# Patient Record
Sex: Male | Born: 1985 | Race: Black or African American | Hispanic: No | Marital: Single | Smoking: Former smoker
Health system: Southern US, Community
[De-identification: ages and names within clinical notes are randomized; demographics above are authoritative.]

## PROBLEM LIST (undated history)

## (undated) ENCOUNTER — Emergency Department (HOSPITAL_COMMUNITY): Admission: EM | Disposition: A | Discharge status: Home / Self Care | Payer: Medicaid Other

## (undated) DIAGNOSIS — F329 Major depressive disorder, single episode, unspecified: Secondary | ICD-10-CM

## (undated) DIAGNOSIS — I499 Cardiac arrhythmia, unspecified: Secondary | ICD-10-CM

## (undated) DIAGNOSIS — E11319 Type 2 diabetes mellitus with unspecified diabetic retinopathy without macular edema: Secondary | ICD-10-CM

## (undated) DIAGNOSIS — E109 Type 1 diabetes mellitus without complications: Secondary | ICD-10-CM

## (undated) DIAGNOSIS — Z8489 Family history of other specified conditions: Secondary | ICD-10-CM

## (undated) DIAGNOSIS — D649 Anemia, unspecified: Secondary | ICD-10-CM

## (undated) DIAGNOSIS — Z8614 Personal history of Methicillin resistant Staphylococcus aureus infection: Secondary | ICD-10-CM

## (undated) DIAGNOSIS — M1990 Unspecified osteoarthritis, unspecified site: Secondary | ICD-10-CM

## (undated) DIAGNOSIS — M86659 Other chronic osteomyelitis, unspecified thigh: Secondary | ICD-10-CM

## (undated) DIAGNOSIS — M797 Fibromyalgia: Secondary | ICD-10-CM

## (undated) DIAGNOSIS — N390 Urinary tract infection, site not specified: Secondary | ICD-10-CM

## (undated) DIAGNOSIS — T8859XA Other complications of anesthesia, initial encounter: Secondary | ICD-10-CM

## (undated) DIAGNOSIS — F419 Anxiety disorder, unspecified: Secondary | ICD-10-CM

## (undated) DIAGNOSIS — J189 Pneumonia, unspecified organism: Secondary | ICD-10-CM

## (undated) DIAGNOSIS — R569 Unspecified convulsions: Secondary | ICD-10-CM

## (undated) DIAGNOSIS — E1140 Type 2 diabetes mellitus with diabetic neuropathy, unspecified: Secondary | ICD-10-CM

## (undated) DIAGNOSIS — Z930 Tracheostomy status: Secondary | ICD-10-CM

## (undated) DIAGNOSIS — K219 Gastro-esophageal reflux disease without esophagitis: Secondary | ICD-10-CM

## (undated) DIAGNOSIS — I639 Cerebral infarction, unspecified: Secondary | ICD-10-CM

## (undated) DIAGNOSIS — Z8719 Personal history of other diseases of the digestive system: Secondary | ICD-10-CM

## (undated) DIAGNOSIS — Z86718 Personal history of other venous thrombosis and embolism: Secondary | ICD-10-CM

## (undated) DIAGNOSIS — Z889 Allergy status to unspecified drugs, medicaments and biological substances status: Secondary | ICD-10-CM

## (undated) DIAGNOSIS — F32A Depression, unspecified: Secondary | ICD-10-CM

## (undated) DIAGNOSIS — R55 Syncope and collapse: Secondary | ICD-10-CM

## (undated) DIAGNOSIS — Z933 Colostomy status: Secondary | ICD-10-CM

## (undated) DIAGNOSIS — Z8709 Personal history of other diseases of the respiratory system: Secondary | ICD-10-CM

## (undated) DIAGNOSIS — T4145XA Adverse effect of unspecified anesthetic, initial encounter: Secondary | ICD-10-CM

## (undated) DIAGNOSIS — K3184 Gastroparesis: Secondary | ICD-10-CM

## (undated) HISTORY — DX: Allergy status to unspecified drugs, medicaments and biological substances status: Z88.9

## (undated) HISTORY — DX: Syncope and collapse: R55

## (undated) HISTORY — DX: Gastro-esophageal reflux disease without esophagitis: K21.9

## (undated) HISTORY — PX: GASTROSTOMY TUBE PLACEMENT: SHX655

## (undated) HISTORY — PX: TONSILLECTOMY: SUR1361

## (undated) HISTORY — DX: Other chronic osteomyelitis, unspecified thigh: M86.659

## (undated) HISTORY — PX: COLOSTOMY: SHX63

## (undated) HISTORY — DX: Personal history of Methicillin resistant Staphylococcus aureus infection: Z86.14

## (undated) HISTORY — DX: Urinary tract infection, site not specified: N39.0

---
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
## 2003-02-10 ENCOUNTER — Emergency Department (HOSPITAL_COMMUNITY): Admission: EM | Admit: 2003-02-10 | Discharge: 2003-02-10 | Payer: Self-pay | Admitting: Emergency Medicine

## 2004-10-14 ENCOUNTER — Emergency Department (HOSPITAL_COMMUNITY): Admission: EM | Admit: 2004-10-14 | Discharge: 2004-10-14 | Payer: Self-pay | Admitting: Family Medicine

## 2004-10-18 ENCOUNTER — Ambulatory Visit: Payer: Self-pay | Admitting: Family Medicine

## 2004-10-30 ENCOUNTER — Ambulatory Visit: Payer: Self-pay | Admitting: Family Medicine

## 2005-07-09 ENCOUNTER — Ambulatory Visit: Payer: Self-pay | Admitting: Family Medicine

## 2006-03-05 ENCOUNTER — Emergency Department (HOSPITAL_COMMUNITY): Admission: EM | Admit: 2006-03-05 | Discharge: 2006-03-05 | Payer: Self-pay | Admitting: Emergency Medicine

## 2006-06-28 ENCOUNTER — Ambulatory Visit: Payer: Self-pay | Admitting: Family Medicine

## 2007-01-28 ENCOUNTER — Ambulatory Visit: Payer: Self-pay | Admitting: Family Medicine

## 2007-01-28 LAB — CONVERTED CEMR LAB: Chlamydia, Swab/Urine, PCR: NEGATIVE

## 2007-05-29 DIAGNOSIS — J45909 Unspecified asthma, uncomplicated: Secondary | ICD-10-CM

## 2007-05-29 DIAGNOSIS — E119 Type 2 diabetes mellitus without complications: Secondary | ICD-10-CM

## 2007-05-29 DIAGNOSIS — E1040 Type 1 diabetes mellitus with diabetic neuropathy, unspecified: Secondary | ICD-10-CM

## 2007-05-29 DIAGNOSIS — E1065 Type 1 diabetes mellitus with hyperglycemia: Secondary | ICD-10-CM

## 2007-09-15 ENCOUNTER — Ambulatory Visit: Payer: Self-pay | Admitting: Family Medicine

## 2007-09-15 DIAGNOSIS — K219 Gastro-esophageal reflux disease without esophagitis: Secondary | ICD-10-CM

## 2007-09-15 DIAGNOSIS — J069 Acute upper respiratory infection, unspecified: Secondary | ICD-10-CM | POA: Insufficient documentation

## 2007-11-26 ENCOUNTER — Ambulatory Visit: Payer: Self-pay | Admitting: Family Medicine

## 2008-01-26 ENCOUNTER — Telehealth: Payer: Self-pay | Admitting: Family Medicine

## 2008-05-05 ENCOUNTER — Telehealth: Payer: Self-pay | Admitting: Family Medicine

## 2008-05-13 IMAGING — CR DG HAND COMPLETE 3+V*L*
3 series · 3 of 3 positions shown · non-contrast
Comparison: none

CLINICAL DATA: Left hand laceration following a crush injury.

LEFT HAND - 3 VIEW

[view not recorded (1 of 3)]
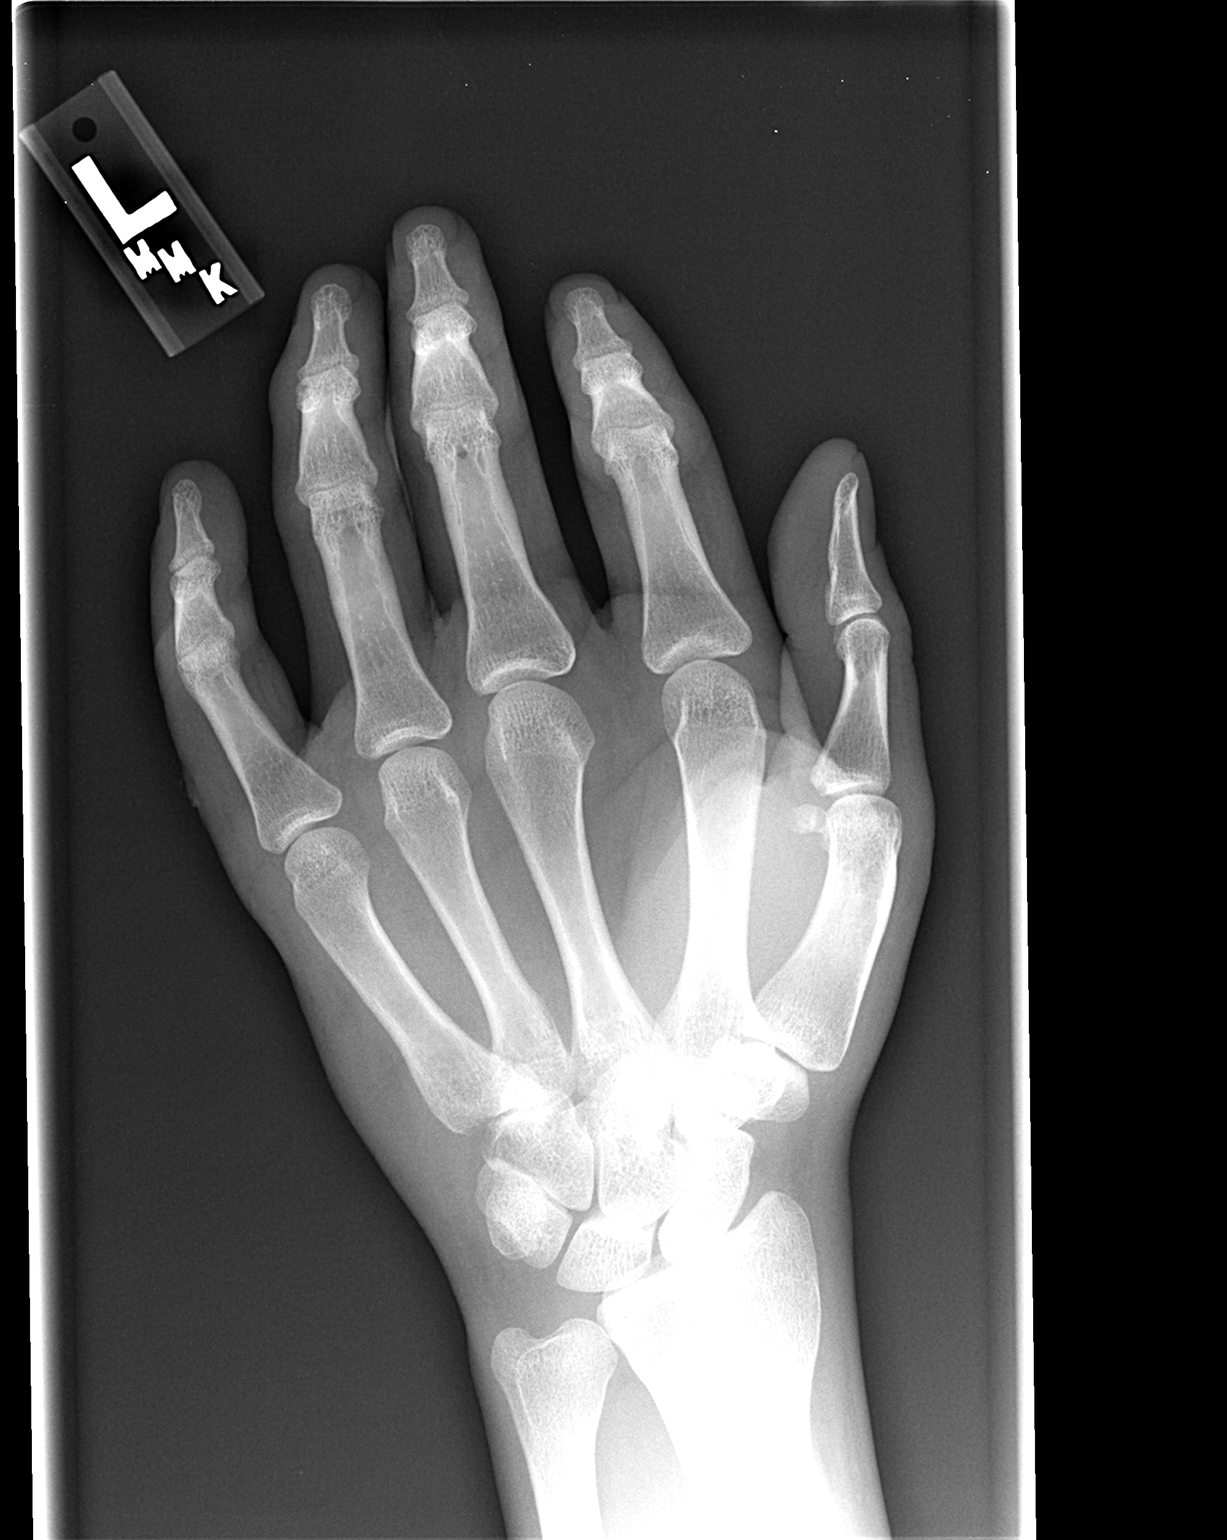

[view not recorded (2 of 3)]
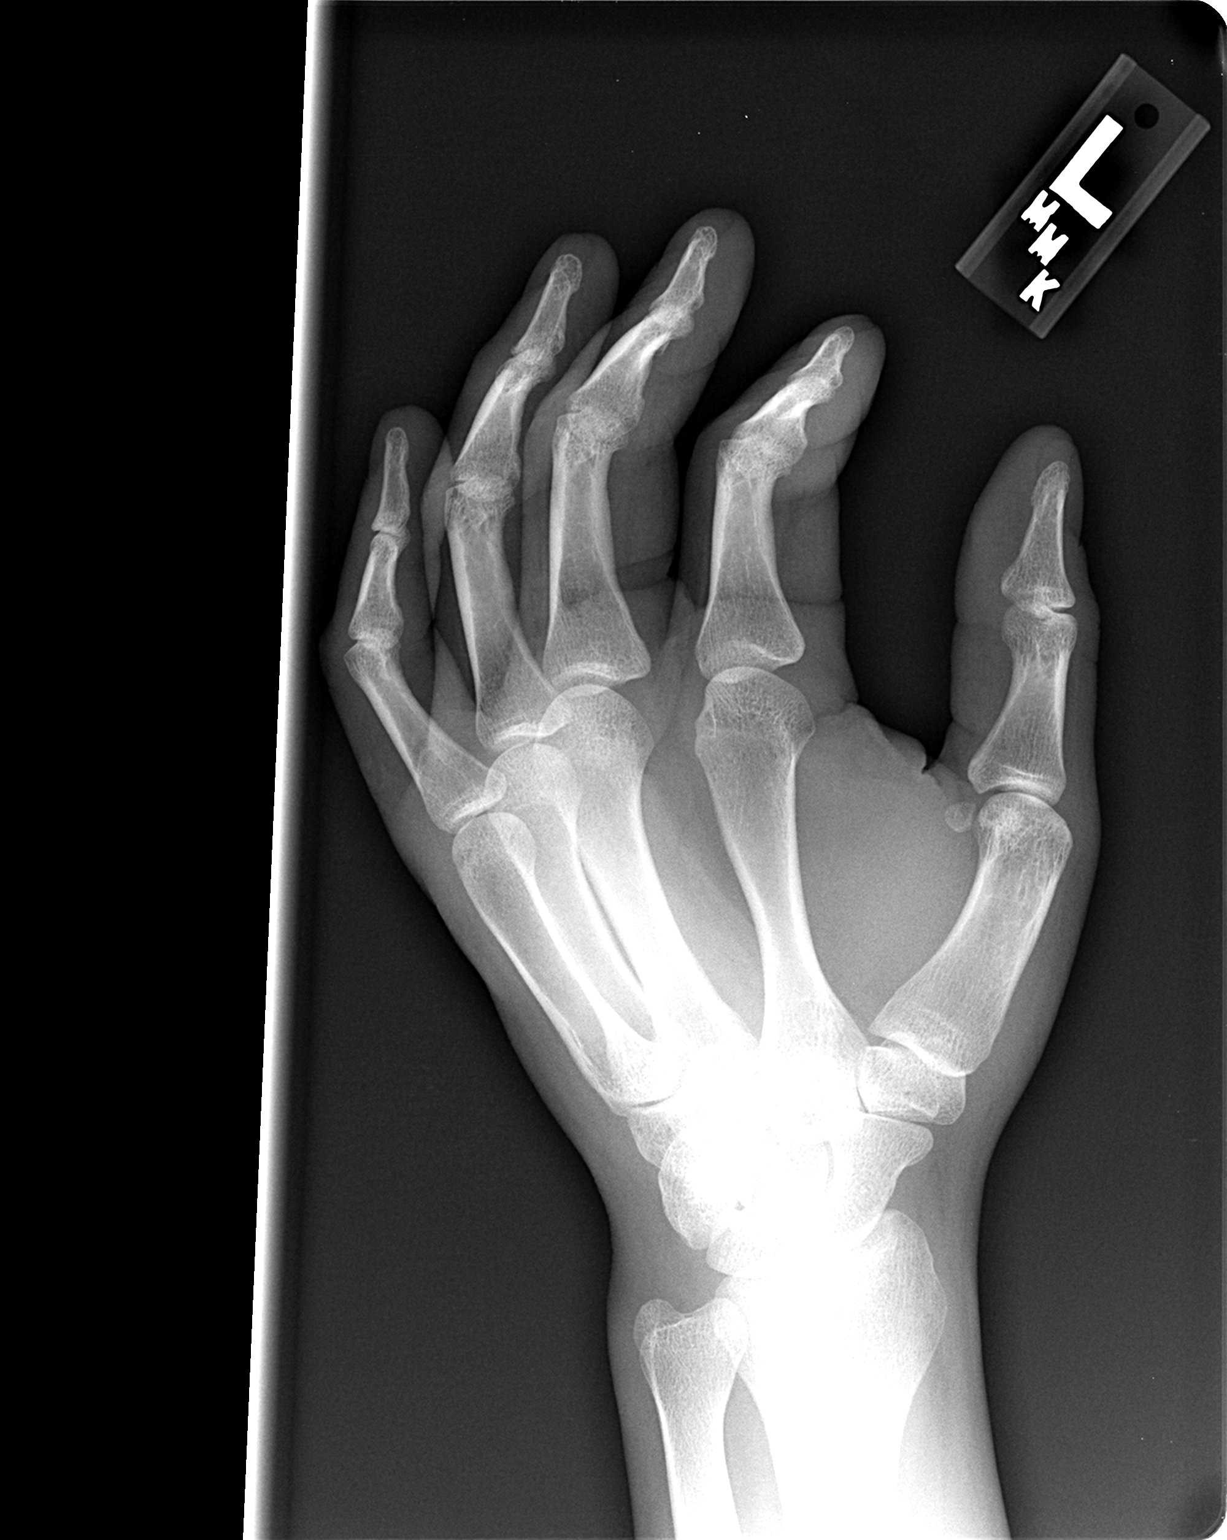

[view not recorded (3 of 3)]
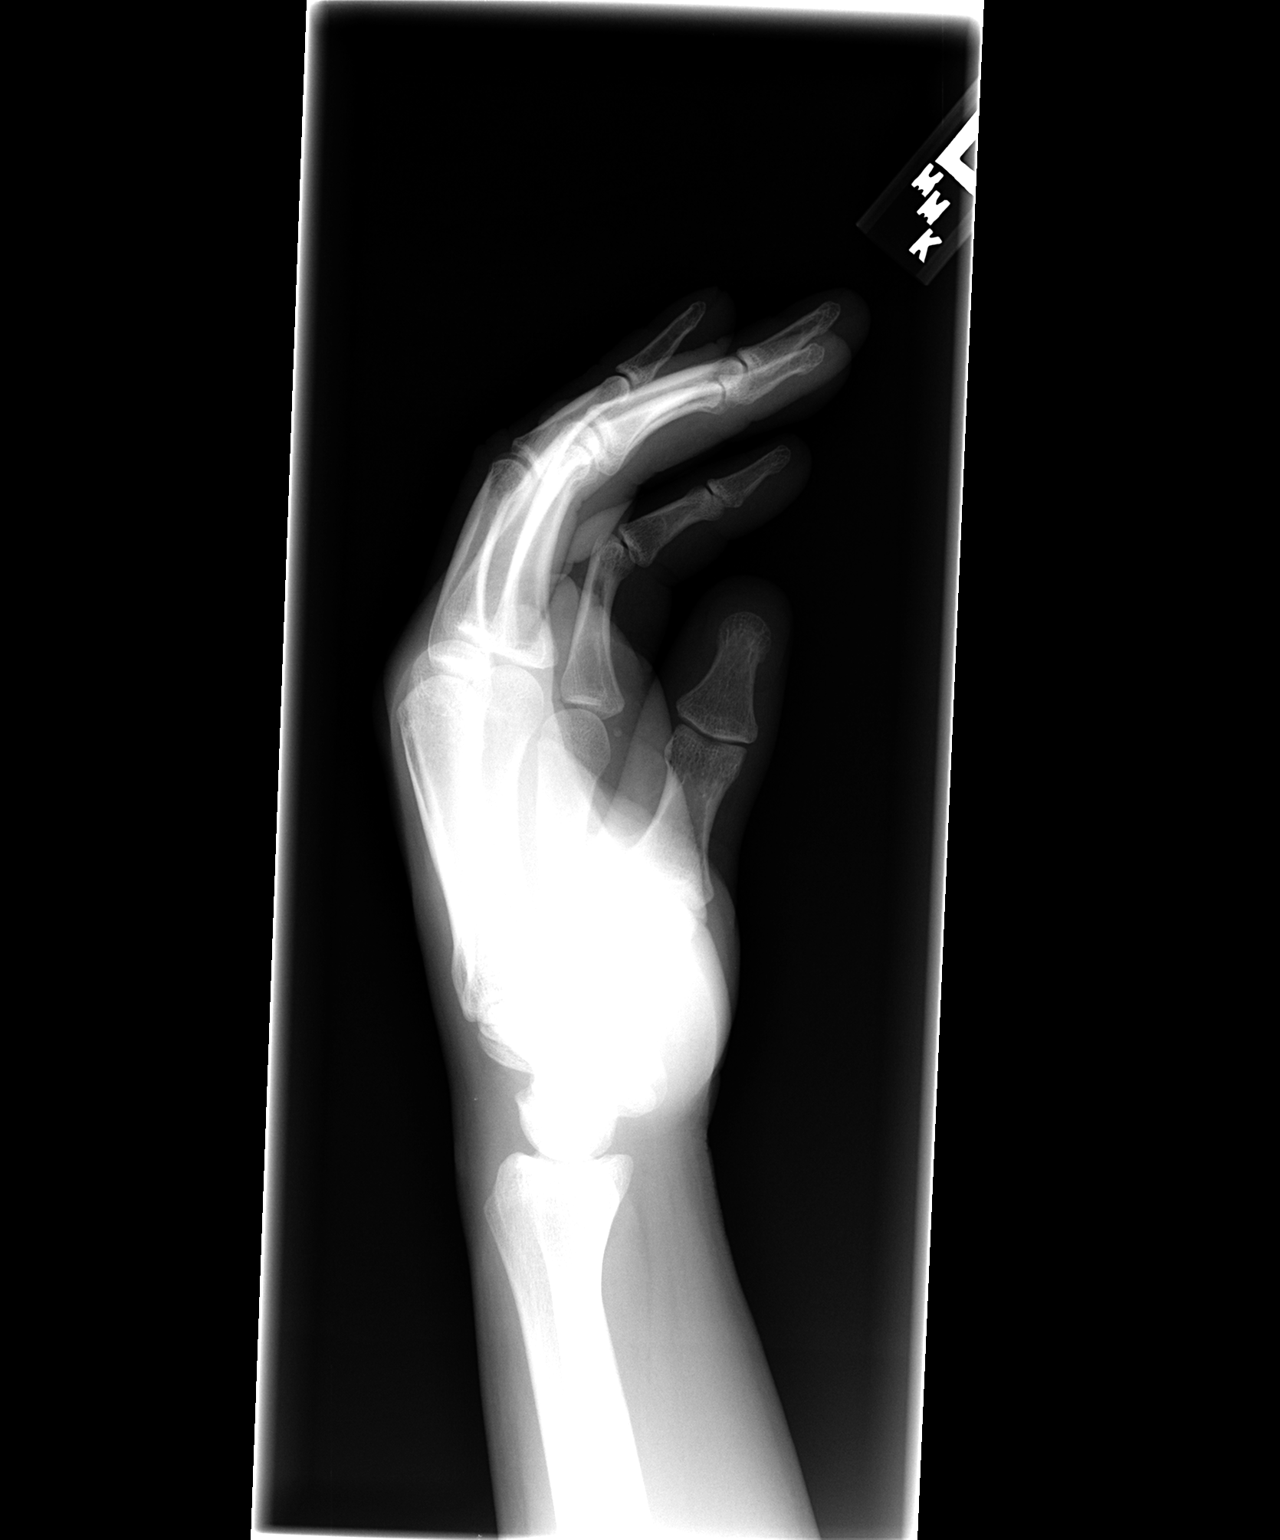

[3 of 3 positions shown; findings below may reference images not displayed]

FINDINGS: Soft tissue laceration medial to the fifth proximal phalanx.
Associated soft tissue swelling. No fracture, dislocation or soft tissue air
seen. No radiopaque foreign body seen.

IMPRESSION

No fracture or radiopaque foreign body.

## 2008-06-09 ENCOUNTER — Ambulatory Visit: Payer: Self-pay | Admitting: Family Medicine

## 2008-06-09 DIAGNOSIS — S43499A Other sprain of unspecified shoulder joint, initial encounter: Secondary | ICD-10-CM

## 2008-06-09 DIAGNOSIS — S46819A Strain of other muscles, fascia and tendons at shoulder and upper arm level, unspecified arm, initial encounter: Secondary | ICD-10-CM

## 2008-06-09 DIAGNOSIS — S20219A Contusion of unspecified front wall of thorax, initial encounter: Secondary | ICD-10-CM

## 2008-08-05 ENCOUNTER — Encounter: Payer: Self-pay | Admitting: Family Medicine

## 2009-03-02 ENCOUNTER — Telehealth: Payer: Self-pay | Admitting: Family Medicine

## 2009-05-14 ENCOUNTER — Emergency Department (HOSPITAL_BASED_OUTPATIENT_CLINIC_OR_DEPARTMENT_OTHER): Admission: EM | Admit: 2009-05-14 | Discharge: 2009-05-15 | Payer: Self-pay | Admitting: Internal Medicine

## 2009-08-27 ENCOUNTER — Ambulatory Visit: Payer: Self-pay | Admitting: Family Medicine

## 2009-08-27 DIAGNOSIS — L03211 Cellulitis of face: Secondary | ICD-10-CM | POA: Insufficient documentation

## 2009-08-27 DIAGNOSIS — L0201 Cutaneous abscess of face: Secondary | ICD-10-CM

## 2009-11-23 ENCOUNTER — Emergency Department (HOSPITAL_BASED_OUTPATIENT_CLINIC_OR_DEPARTMENT_OTHER): Admission: EM | Admit: 2009-11-23 | Discharge: 2009-11-23 | Payer: Self-pay | Admitting: Emergency Medicine

## 2009-11-23 ENCOUNTER — Ambulatory Visit: Payer: Self-pay | Admitting: Diagnostic Radiology

## 2010-01-09 ENCOUNTER — Ambulatory Visit: Payer: Self-pay | Admitting: Diagnostic Radiology

## 2010-01-09 ENCOUNTER — Emergency Department (HOSPITAL_BASED_OUTPATIENT_CLINIC_OR_DEPARTMENT_OTHER): Admission: EM | Admit: 2010-01-09 | Discharge: 2010-01-09 | Payer: Self-pay | Admitting: Emergency Medicine

## 2010-02-04 ENCOUNTER — Emergency Department (HOSPITAL_BASED_OUTPATIENT_CLINIC_OR_DEPARTMENT_OTHER): Admission: EM | Admit: 2010-02-04 | Discharge: 2010-02-04 | Payer: Self-pay | Admitting: Emergency Medicine

## 2010-02-15 ENCOUNTER — Other Ambulatory Visit: Payer: Self-pay | Admitting: Emergency Medicine

## 2010-02-15 ENCOUNTER — Inpatient Hospital Stay (HOSPITAL_COMMUNITY): Admission: EM | Admit: 2010-02-15 | Discharge: 2010-02-16 | Payer: Self-pay | Admitting: Internal Medicine

## 2010-10-20 ENCOUNTER — Telehealth: Payer: Self-pay | Admitting: Family Medicine

## 2010-11-09 NOTE — Progress Notes (Signed)
Summary: Pt needs to get letter by Dr. Clent Ridges today re: dx of Diabetes  Phone Note Call from Patient Call back at [phone] Jason [name]: father - [name] Summary of Call: Pt called and is try to get on [employer] and in order to do so, it reqs Pt to get a letter from Dr. Clent Ridges, stating that pt has been dx with diabetes and then fax the letter to [facility] Fax#[phone] attn to whom it may concern.  Initial call taken by: [name],  October 20, 2010 9:32 AM  Follow-up for Phone Call        this needs to be done by Dr. Horald Pollen, his diabetes doctor  Follow-up by: [name],  October 20, 2010 4:23 PM  Additional Follow-up for Phone Call Additional follow up Details #1::        I called pts father and notified him that he would need to contact Dr Vidal Schwalbe office as noted. Additional Follow-up by: [name],  October 23, 2010 9:28 AM

## 2010-12-26 LAB — CBC
MCHC: 34.3 g/dL (ref 30.0–36.0)
MCHC: 33.4 g/dL (ref 30.0–36.0)
MCV: 84.8 fL (ref 78.0–100.0)
Platelets: 286 10*3/uL (ref 150–400)
RBC: 3.97 MIL/uL — ABNORMAL LOW (ref 4.22–5.81)
RBC: 4.92 MIL/uL (ref 4.22–5.81)
RDW: 11.7 % (ref 11.5–15.5)

## 2010-12-26 LAB — BASIC METABOLIC PANEL
BUN: 12 mg/dL (ref 6–23)
BUN: 8 mg/dL (ref 6–23)
BUN: 8 mg/dL (ref 6–23)
BUN: 9 mg/dL (ref 6–23)
BUN: 9 mg/dL (ref 6–23)
BUN: 15 mg/dL (ref 6–23)
CO2: 24 mEq/L (ref 19–32)
CO2: 25 mEq/L (ref 19–32)
CO2: 15 mEq/L — ABNORMAL LOW (ref 19–32)
Calcium: 8.3 mg/dL — ABNORMAL LOW (ref 8.4–10.5)
Calcium: 8.7 mg/dL (ref 8.4–10.5)
Calcium: 9.6 mg/dL (ref 8.4–10.5)
Chloride: 101 mEq/L (ref 96–112)
Chloride: 103 mEq/L (ref 96–112)
Chloride: 103 mEq/L (ref 96–112)
Chloride: 109 mEq/L (ref 96–112)
Creatinine, Ser: 0.92 mg/dL (ref 0.4–1.5)
Creatinine, Ser: 0.98 mg/dL (ref 0.4–1.5)
Creatinine, Ser: 1.05 mg/dL (ref 0.4–1.5)
Creatinine, Ser: 1.2 mg/dL (ref 0.4–1.5)
GFR calc Af Amer: >60 mL/min (ref >60)
GFR calc Af Amer: >60 mL/min (ref >60)
GFR calc Af Amer: >60 mL/min (ref >60)
GFR calc Af Amer: >60 mL/min (ref >60)
GFR calc non Af Amer: >60 mL/min (ref >60)
GFR calc non Af Amer: >60 mL/min (ref >60)
GFR calc non Af Amer: >60 mL/min (ref >60)
Glucose, Bld: 163 mg/dL — ABNORMAL HIGH (ref 70–99)
Glucose, Bld: 283 mg/dL — ABNORMAL HIGH (ref 70–99)
Glucose, Bld: 346 mg/dL — ABNORMAL HIGH (ref 70–99)
Glucose, Bld: 94 mg/dL (ref 70–99)
Potassium: 3.6 mEq/L (ref 3.5–5.1)
Potassium: 3.6 mEq/L (ref 3.5–5.1)
Potassium: 3.6 mEq/L (ref 3.5–5.1)
Potassium: 4 mEq/L (ref 3.5–5.1)
Sodium: 130 mEq/L — ABNORMAL LOW (ref 135–145)
Sodium: 132 mEq/L — ABNORMAL LOW (ref 135–145)
Sodium: 137 mEq/L (ref 135–145)

## 2010-12-26 LAB — DIFFERENTIAL
Basophils Absolute: 0 10*3/uL (ref 0.0–0.1)
Basophils Relative: 0 % (ref 0–1)
Eosinophils Absolute: 0.2 10*3/uL (ref 0.0–0.7)
Monocytes Relative: 7 % (ref 3–12)
Neutro Abs: 7.1 10*3/uL (ref 1.7–7.7)
Neutrophils Relative %: 62 % (ref 43–77)

## 2010-12-26 LAB — POCT I-STAT 3, ART BLOOD GAS (G3+)
pCO2 arterial: 34.5 mmHg — ABNORMAL LOW (ref 35.0–45.0)
pH, Arterial: 7.359 (ref 7.350–7.450)
pO2, Arterial: 63 mmHg — ABNORMAL LOW (ref 80.0–100.0)

## 2010-12-26 LAB — COMPREHENSIVE METABOLIC PANEL
ALT: 12 U/L (ref 0–53)
AST: 14 U/L (ref 0–37)
CO2: 25 mEq/L (ref 19–32)
Calcium: 8.4 mg/dL (ref 8.4–10.5)
GFR calc Af Amer: >60 mL/min (ref >60)
GFR calc non Af Amer: >60 mL/min (ref >60)
Sodium: 134 mEq/L — ABNORMAL LOW (ref 135–145)
Total Protein: 5.8 g/dL — ABNORMAL LOW (ref 6.0–8.3)

## 2010-12-26 LAB — KETONES, QUALITATIVE

## 2010-12-26 LAB — POCT TOXICOLOGY PANEL

## 2010-12-26 LAB — GLUCOSE, CAPILLARY
Glucose-Capillary: 101 mg/dL — ABNORMAL HIGH (ref 70–99)
Glucose-Capillary: 112 mg/dL — ABNORMAL HIGH (ref 70–99)
Glucose-Capillary: 150 mg/dL — ABNORMAL HIGH (ref 70–99)
Glucose-Capillary: 212 mg/dL — ABNORMAL HIGH (ref 70–99)
Glucose-Capillary: 254 mg/dL — ABNORMAL HIGH (ref 70–99)
Glucose-Capillary: 365 mg/dL — ABNORMAL HIGH (ref 70–99)
Glucose-Capillary: >600 mg/dL (ref 70–99)
Glucose-Capillary: >600 mg/dL (ref 70–99)

## 2010-12-26 LAB — URINALYSIS, ROUTINE W REFLEX MICROSCOPIC
Glucose, UA: >1000 mg/dL — AB
Leukocytes, UA: NEGATIVE
Nitrite: NEGATIVE
Protein, ur: NEGATIVE mg/dL
pH: 5.5 (ref 5.0–8.0)

## 2010-12-26 LAB — PHOSPHORUS
Phosphorus: 3.4 mg/dL (ref 2.3–4.6)
Phosphorus: 3.5 mg/dL (ref 2.3–4.6)
Phosphorus: 3.6 mg/dL (ref 2.3–4.6)

## 2010-12-26 LAB — MAGNESIUM
Magnesium: 1.6 mg/dL (ref 1.5–2.5)
Magnesium: 1.6 mg/dL (ref 1.5–2.5)
Magnesium: 1.7 mg/dL (ref 1.5–2.5)

## 2010-12-26 LAB — URINE MICROSCOPIC-ADD ON

## 2010-12-26 LAB — HEPATIC FUNCTION PANEL
ALT: 15 U/L (ref 0–53)
Albumin: 3.7 g/dL (ref 3.5–5.2)
Indirect Bilirubin: 1.7 mg/dL — ABNORMAL HIGH (ref 0.3–0.9)
Total Protein: 6.5 g/dL (ref 6.0–8.3)

## 2010-12-26 LAB — LIPASE, BLOOD: Lipase: 18 U/L (ref 11–59)

## 2010-12-26 LAB — ETHANOL: Alcohol, Ethyl (B): <5 mg/dL (ref 0–10)

## 2010-12-26 LAB — AMYLASE: Amylase: 29 U/L (ref 0–105)

## 2010-12-27 LAB — POCT CARDIAC MARKERS
CKMB, poc: 1 ng/mL (ref 1.0–8.0)
CKMB, poc: <1 ng/mL — ABNORMAL LOW (ref 1.0–8.0)
Myoglobin, poc: 32.9 ng/mL (ref 12–200)
Troponin i, poc: <0.05 ng/mL (ref 0.00–0.09)

## 2010-12-27 LAB — DIFFERENTIAL
Basophils Absolute: 0.2 10*3/uL — ABNORMAL HIGH (ref 0.0–0.1)
Eosinophils Relative: 2 % (ref 0–5)
Lymphocytes Relative: 47 % — ABNORMAL HIGH (ref 12–46)
Lymphs Abs: 3.2 10*3/uL (ref 0.7–4.0)
Neutro Abs: 2.7 10*3/uL (ref 1.7–7.7)

## 2010-12-27 LAB — CBC
HCT: 35.8 % — ABNORMAL LOW (ref 39.0–52.0)
Hemoglobin: 12.8 g/dL — ABNORMAL LOW (ref 13.0–17.0)
Platelets: 282 10*3/uL (ref 150–400)
RDW: 12.1 % (ref 11.5–15.5)
WBC: 6.7 10*3/uL (ref 4.0–10.5)

## 2010-12-27 LAB — BASIC METABOLIC PANEL
Calcium: 8.8 mg/dL (ref 8.4–10.5)
GFR calc non Af Amer: >60 mL/min (ref >60)
Glucose, Bld: 380 mg/dL — ABNORMAL HIGH (ref 70–99)
Potassium: 4.1 mEq/L (ref 3.5–5.1)
Sodium: 139 mEq/L (ref 135–145)

## 2010-12-27 LAB — GLUCOSE, CAPILLARY

## 2010-12-27 LAB — PROTIME-INR: Prothrombin Time: 13 seconds (ref 11.6–15.2)

## 2011-01-14 LAB — GRAM STAIN: Gram Stain: NONE SEEN

## 2011-01-14 LAB — CULTURE, ROUTINE-ABSCESS

## 2011-02-23 NOTE — Discharge Summary (Signed)
[place]. [facility]  Patient:    Jason [name], Jason [name]                    MRN: [id number] Adm. Date:  [id number] Disc. Date: 04/15/01 Attending:  Corwin Levins                           Discharge Summary  DISCHARGE DIAGNOSES: 1. Diabetic ketoacidosis. 2. Asthma. 3. Noncompliance.  PROCEDURES:  None.  CONSULTS: 1. Diabetic counseling. 2. Nutrition counseling. 3. Social work, case Insurance account manager. 4. Pediatric psychology.  HISTORY AND PHYSICAL:  See that dictated the day of admission, April 13, 2001.  HOSPITAL COURSE:  [name] is a 25 year old black male here with diabetic ketoacidosis primary secondary to noncompliance with his insulin and "not taking it seriously."  He missed several doses.  Not associated with an infection or other physical issues.  He was treated in the usual fashion per [facility] with insulin drip and IV fluids including potassium and phosphate and frequent checking of CBGs, BMET, and ketones. Serum ketones cleared by the next morning as well as abdominal pain, nausea and vomiting, and he overall felt much better.  Acidosis resolved.  He was restarted on his usual outpatient insulin regimen, advanced on a diet and otherwise did quite well without any other clinical problems.  Hemoglobin A1C was obtained just prior to discharge.  Social work advocated a new Biochemist, clinical.  He was seen by pediatric psychology who documented issues of compliance, anxiety and risk taking behavior.  He was seen by nutrition and diabetes counseling as well which apparently had a good effect as far as education.  As he was eating well, ambulatory, acidosis resolved, CBGs in the low 100s and no other issues identified, he was felt to have gained maximum benefits of hospitalization and is to be discharged.  DISPOSITION:  Discharged to home in good condition.  DIET:  Diabetic diet as before.  ACTIVITY:  No restrictions.  HOME MEDICATIONS:  These  include all that as before including: 1. Lantus 32 units subcu q.h.s. 2. Humalog. 3. Sliding scale insulin. 4. Protonix 40 mg p.o. q.d. 5. Levsin sublingual p.r.n. 6. Albuterol metered dose inhaler. 7. Intal metered dose inhaler. 8. AeroBid metered dose inhaler.  He is also interested in obtaining a letter for school, called a letter of need, from his primary physician to receive a learners permit, drivers license.  I asked him to follow up with Dr. Lovell Sheehan about this as he would be high risk driving without more compliance with his diabetes. DD:  04/15/01 TD:  04/15/01 Job: [id number]

## 2011-02-23 NOTE — H&P (Signed)
[place]. [facility]  Patient:    Jason [name], Jason [name]                    MRN: [id number] Adm. Date:  [id number] Disc. Date: [id number] Attending:  Donnetta Hutching CC:         Jason [name], M.D. [facility]   History and Physical  CHIEF COMPLAINT:  Nausea and vomiting.  HISTORY OF PRESENT ILLNESS:  [name] is a 25 year old black male brought here by EMS after he fell ill after several episodes of nausea and vomiting earlier today.  Found in the emergency room to have marked acidosis, diabetic ketoacidosis.  Abdominal pain started last p.m.  The patient admits and the mother corroborates noncompliance with taking insulin, checking CBGs, etc.  He has been followed previously by a pediatric endocrinologist at [facility] but due to the patients noncompliance have not been able to institute the pump as would probably help him.  He is also followed by Dr. Darryll [name].  Last diabetic ketoacidosis episode requiring hospitalization was a year ago according to the mother.  He has been in the emergency room at least six times for dehydration over the past eight to ten months, however.  PAST MEDICAL HISTORY:  Illness:  1. Asthma.  2. Allergies.  3. Type I diabetes since 25 years old.  No major sequelae so far identified except questionable recent mild LFTS abnormality, unclear etiology.  4.  Noncompliance.  PAST SURGICAL HISTORY:  Status post tonsillectomy and adenoidectomy.  ALLERGIES:  CEFTIN, PENICILLIN, TESSALON PERLES.  MEDICATIONS:  Albuterol meter dose inhaler.  Intal meter dose inhaler. AeroBid meter dose inhaler.  Protonix 40 mg p.o. q.d.  Levsin sublingual. Lantus 32 units subcutaneous q.h.s. and Humalog sliding scale insulin.  SOCIAL HISTORY:  Lives with his mother and father.  He is currently a senior in high school.  He is single. No illegal drugs.  Father is a Astronomer.  FAMILY HISTORY:  Significant for diabetes, hypertension, stroke and asthma.  REVIEW OF  SYSTEMS:  No fever, otherwise noncontributory.  PHYSICAL EXAMINATION:  GENERAL:  [name] is a 25 year old black male typical DKA appearance with ketotic breath.  Somewhat tachypneic.  Tachycardia.  He is otherwise alert but mild ill appearing.  VITAL SIGNS:  Blood pressure 130/88, respirations 16, heart rate 108, afebrile.  HEENT:  Sclerae clear.  TMs clear.  Pharynx benign.  NECK:  No lymphadenopathy, JVD, thyromegaly.  CHEST:  No rales or wheezing.  CARDIAC:  Regular rate and rhythm without murmur.  ABDOMEN: Diffusely mildly tender.  Positive bowel sounds.  No organomegaly.  EXTREMITIES:  No edema.  NEUROLOGIC:  Alert and oriented times three, otherwise intact.  LABORATORY DATA:  Include initial CBG greater than 700.  Electrolytes:  Sodium 138, potassium 5.1, chloride 104, bicarbonate 11, BUN 16, creatinine 0.9, gamma 28.  Hemoglobin 14.  pH 7.14, pCO2 30, pO2 unclear.  CBC with differential and urinalysis are pending.  ASSESSMENT/PLAN: 1. Diabetic ketoacidosis.  Will initiate IV fluids and Glucometer protocol.   Follow ketones in the a.m.  Hopefully simply reinitiate his usual insulin    regimen in the morning if the nausea and vomiting resolves.  With diet    advancement.  The main issue is noncompliance.  Does not appear to have    other factors involved such as pneumonia, etc to cause DKA.  He may    benefit from diabetic education while hospitalized and possibly    adolescent behavioral health  counselling, etc. 2. Asthma.  Currently stable.  Continue medication. DD:  04/13/01 TD:  04/13/01 Job: [id number]

## 2011-11-14 ENCOUNTER — Telehealth: Payer: Self-pay | Admitting: *Deleted

## 2011-11-14 DIAGNOSIS — N469 Male infertility, unspecified: Secondary | ICD-10-CM

## 2011-11-14 NOTE — Telephone Encounter (Signed)
Tell him I have referred him to Urology for this

## 2011-11-14 NOTE — Telephone Encounter (Signed)
Pt would like to speak to Dr. Clent Ridges or his nurse how he can go about getting his infertility issues checked out.

## 2011-11-15 ENCOUNTER — Encounter: Payer: Self-pay | Admitting: Family

## 2011-11-15 ENCOUNTER — Telehealth: Payer: Self-pay | Admitting: *Deleted

## 2011-11-15 ENCOUNTER — Ambulatory Visit (INDEPENDENT_AMBULATORY_CARE_PROVIDER_SITE_OTHER): Payer: Self-pay | Admitting: Family

## 2011-11-15 VITALS — BP 120/78 | Temp 98.2°F | Ht 67.75 in | Wt 138.0 lb

## 2011-11-15 DIAGNOSIS — G609 Hereditary and idiopathic neuropathy, unspecified: Secondary | ICD-10-CM

## 2011-11-15 DIAGNOSIS — E109 Type 1 diabetes mellitus without complications: Secondary | ICD-10-CM

## 2011-11-15 DIAGNOSIS — G629 Polyneuropathy, unspecified: Secondary | ICD-10-CM

## 2011-11-15 LAB — GLUCOSE, POCT (MANUAL RESULT ENTRY)

## 2011-11-15 LAB — HEMOGLOBIN A1C: Hgb A1c MFr Bld: 14.7 % — ABNORMAL HIGH (ref 4.6–6.5)

## 2011-11-15 LAB — BASIC METABOLIC PANEL
BUN: 8 mg/dL (ref 6–23)
Creatinine, Ser: 1.1 mg/dL (ref 0.4–1.5)
GFR: 100.88 mL/min (ref >60.00)
Potassium: 3.9 mEq/L (ref 3.5–5.1)

## 2011-11-15 MED ORDER — INSULIN LISPRO PROT & LISPRO (75-25 MIX) 100 UNIT/ML ~~LOC~~ SUSP: 15.0000 [IU] | Freq: Every day | SUBCUTANEOUS | Status: DC

## 2011-11-15 MED ORDER — INSULIN LISPRO 100 UNIT/ML ~~LOC~~ SOLN: SUBCUTANEOUS | Status: DC

## 2011-11-15 NOTE — Patient Instructions (Addendum)
Diabetes and Exercise Regular exercise is important and can help:   Control blood glucose (sugar).   Decrease blood pressure.    Control blood lipids (cholesterol, triglycerides).   Improve overall health.  BENEFITS FROM EXERCISE  Improved fitness.   Improved flexibility.   Improved endurance.   Increased bone density.   Weight control.   Increased muscle strength.   Decreased body fat.   Improvement of the body's use of insulin, a hormone.   Increased insulin sensitivity.   Reduction of insulin needs.   Reduced stress and tension.   Helps you feel better.  People with diabetes who add exercise to their lifestyle gain additional benefits, including:  Weight loss.   Reduced appetite.   Improvement of the body's use of blood glucose.   Decreased risk factors for heart disease:   Lowering of cholesterol and triglycerides.   Raising the level of good cholesterol (high-density lipoproteins, HDL).   Lowering blood sugar.   Decreased blood pressure.  TYPE 1 DIABETES AND EXERCISE  Exercise will usually lower your blood glucose.   If blood glucose is greater than 240 mg/dl, check urine ketones. If ketones are present, do not exercise.   Location of the insulin injection sites may need to be adjusted with exercise. Avoid injecting insulin into areas of the body that will be exercised. For example, avoid injecting insulin into:   The arms when playing tennis.   The legs when jogging. For more information, discuss this with your caregiver.   Keep a record of:   Food intake.   Type and amount of exercise.   Expected peak times of insulin action.   Blood glucose levels.  Do this before, during, and after exercise. Review your records with your caregiver. This will help you to develop guidelines for adjusting food intake and insulin amounts.  Document Released: 12/15/2003 Document Revised: 06/06/2011 Document Reviewed: 03/31/2009 [facility] Patient  Information 2012 [place], [place].  Correction Insulin Your caregiver has decided you need insulin at home. You have been given a correctional scale (sliding scale) in case you need extra insulin when your blood sugar is too high (hyperglycemia). The following instructions will assist you in how to use that correctional scale.  WHAT IS A CORRECTIONAL SCALE (SLIDING SCALE)?  When you check your blood sugar, sometimes it will be higher than your caregiver wants it to be. You may need an extra dose of insulin to bring your blood sugar to your desired level (also known as your goal, target level, or normal level.) The correctional scale is prescribed by your caregiver based on your specific needs. Your correctional scale has 2 parts. The first shows you a blood sugar range. The second part tells you how much extra insulin to give yourself if your blood sugar falls within this range. You will not need an extra dose of insulin if your blood glucose is in the desired range. You should always give yourself the normal amount of insulin your caregiver ordered as well.  The most common time to check your blood sugar is before eating and at bedtime. Check with your caregiver so he or she can tell you what the best times are for you.  WHY IS IT IMPORTANT TO KEEP YOUR BLOOD SUGAR LEVELS AT YOUR DESIRED LEVEL?  It helps to prevent long-term complications of diabetes, such as eye disease, kidney failure, and other serious complications. WHAT TYPE OF INSULIN WILL YOU USE?  To help bring down blood sugars that are too high,  your caregiver has prescribed a short-acting or a rapid-acting insulin. An example of a short-acting insulin would be Regular. Remember, you may also have a longer-acting insulin.  WHAT DO I NEED TO DO?   Check your blood sugar with your home blood glucose meter as recommended by your caregiver.   Using your correctional scale, find the range your blood sugar lies in.   Look for the  units of insulin that matches the blood sugar range. Give yourself the dose of correctional insulin your caregiver has prescribed. Always make sure you are using the right type of insulin.   Prior to the injection make sure you have food available that you can eat in the next 15 to 30 minutes.   If your correctional insulin is rapid acting, start eating your meal within 15 minutes after you have given yourself the insulin injection. If you wait longer than 15 minutes to eat, your blood sugar might get too low.   If your correctional insulin is short acting (Regular), start eating your meal within 30 minutes after you have given yourself the insulin injection. If you wait longer than 30 minutes to eat, your blood sugar might get too low. Symptoms of low blood sugar (hypoglycemia) may include feeling shaky or weak, sweating a lot, not thinking straight, difficulty seeing, agitation, or crankiness. Check your blood sugar immediately and treat your results as directed by your caregiver.   Keep a log of your blood sugar results with the time you took the test and the amount of insulin that you injected. This information will help your caregiver manage your medications.   Note on your log anything that may affect your blood sugars such as:   Changes in normal exercise or activity.   Changes in your normal schedule, such as staying up late, going on vacation, changing your diet, or holidays.   New medications. This includes all medications. Some medications, even those that do not require a prescription, may cause high blood sugars.   Illness or stress.   Changes in when you actually took your medication.   Changes in your meals, such as skipping a meal, a late meal, or dining out.   Eating things that may affect blood glucose, such as snacks, larger meal portions than normal, or drinks with sugar.   Ask your caregiver any questions you have.  WHY DO I NEED A CORRECTIONAL SCALE (SLIDING SCALE)  IF I HAVE NEVER BEEN DIAGNOSED WITH DIABETES?   Keeping your blood glucose in range is important for your overall health.   You may have been prescribed medications that cause your blood glucose to be higher than normal.   Your discharging caregiver can provide additional information as needed.  WHEN SHOULD I SEEK MEDICAL CARE?  If you have experienced hypoglycemia that you are unable to treat with your usual routine.   You have questions about your care.   You have persistent hypoglycemia or hyperglycemia.  Someone who lives with you should seek emergency medical care if you become unresponsive. MAKE SURE YOU:  Understand these instructions.   Will watch your condition.   Will get help right away if you are not doing well or get worse.  Document Released: 02/15/2011 Document Revised: 06/12/2011 Document Reviewed: 02/15/2011 [facility] Patient Information 2012 [place], [place].

## 2011-11-15 NOTE — Telephone Encounter (Signed)
Call from [facility] lab, pt A1-C is 14.7 Glucose is [address] pt, information given to Dr Caryl Never

## 2011-11-15 NOTE — Progress Notes (Signed)
  Subjective:    Patient ID: Jason [name], male    DOB: [birth year], 25 y.o.   MRN: [id number]  HPI Comments: C/o numbness and tingling to feet. Type I diabetic diagnosed at age 39. Father and grandfather had adult onset. Does not have insurance and stopped using insulin pump one year ago which was effective for glucose control. "I used an average on pump 28units daily of novolog." Blood sugar read high on monitor. Denies polydipsia, polyuria, polyphasia, visual disturbance, or other associated s/s.      Review of Systems  Constitutional: Negative.   HENT: Negative.   Eyes: Negative for photophobia, pain, discharge, redness, itching and visual disturbance.  Respiratory: Negative.   Cardiovascular: Negative.   Gastrointestinal: Negative.   Genitourinary: Negative.   Musculoskeletal: Negative.   Skin: Negative.   Neurological: Negative.   Hematological: Negative.   Psychiatric/Behavioral: Negative.    No past medical history on file.  History   Social History  . Marital Status: Single    Spouse Name: N/A    Number of Children: N/A  . Years of Education: N/A   Occupational History  . Not on file.   Social History Main Topics  . Smoking status: Current Some Day Smoker  . Smokeless tobacco: Not on file  . Alcohol Use: No  . Drug Use: No  . Sexually Active: Not on file   Other Topics Concern  . Not on file   Social History Narrative  . No narrative on file    No past surgical history on file.  No family history on file.  Allergies  Allergen Reactions  . Cefuroxime Axetil   . Penicillins     No current outpatient prescriptions on file prior to visit.    BP 120/78  Temp(Src) 98.2 F (36.8 C) (Oral)  Ht 5' 7.75" (1.721 m)  Wt 138 lb (62.596 kg)  BMI 21.14 kg/m2chart     Objective:   Physical Exam  Constitutional: He is oriented to person, place, and time. He appears well-developed and well-nourished. No distress.  HENT:  Right Ear: External ear  normal.  Left Ear: External ear normal.  Nose: Nose normal.  Mouth/Throat: Oropharynx is clear and moist.  Eyes: Conjunctivae are normal. Pupils are equal, round, and reactive to light.  Neck: Normal range of motion. Neck supple.  Cardiovascular: Normal rate, normal heart sounds and intact distal pulses.  Exam reveals no gallop.   No murmur heard. Pulmonary/Chest: Effort normal and breath sounds normal. No respiratory distress. He has no wheezes. He has no rales. He exhibits no tenderness.  Abdominal: Soft.  Musculoskeletal: Normal range of motion.  Neurological: He is alert and oriented to person, place, and time.  Skin: Skin is warm and dry.  Psychiatric: He has a normal mood and affect. His behavior is normal.  Blood sugar read "high" on arrival, regular insulin 10 units given, reassessed  Blood sugar in with reading "high" an additional 10 units given Reassessed blood sugar after 20 units of regular insulin, 467.       Assessment & Plan:  Assessment: Type I diabetes, hyperglycemia  Plan: 75/25 humalog 15 units at dinner time. Humalog sliding scale coverage. Check blood sugar daily before meals and at bedtime, report any s/s hypoglycemia/ hyperglycemia, handout teaching provided regarding treatment regime and diabetes type I, exercise, discussed healthy plate method. Labs: A1C, Glucose RTC 3 days on Mon

## 2011-11-15 NOTE — Telephone Encounter (Signed)
Tried to reach patient with lab result and no answer.  Left message on machine to call back.

## 2011-11-15 NOTE — Telephone Encounter (Signed)
Spoke with pt

## 2011-11-16 NOTE — Telephone Encounter (Signed)
Patient refused to go to the ER. Attempt to stabilize patient with prior to discharge. His blood sugar improved with close monitoring and after 10 units regular insulin x 2. Start Humalog 75/25 15 units and sliding scale insulin. I will call this morning to encourage his to go to the ER.

## 2011-11-19 ENCOUNTER — Ambulatory Visit: Payer: Self-pay | Admitting: Family

## 2011-11-21 ENCOUNTER — Ambulatory Visit: Payer: Self-pay | Admitting: Family

## 2011-11-27 ENCOUNTER — Encounter: Payer: Self-pay | Admitting: Family

## 2011-11-27 ENCOUNTER — Ambulatory Visit (INDEPENDENT_AMBULATORY_CARE_PROVIDER_SITE_OTHER): Payer: Self-pay | Admitting: Family

## 2011-11-27 VITALS — BP 120/88 | Temp 97.5°F | Wt 148.0 lb

## 2011-11-27 DIAGNOSIS — E109 Type 1 diabetes mellitus without complications: Secondary | ICD-10-CM

## 2011-11-27 NOTE — Patient Instructions (Signed)
1. Increase 75/25 to 20 units. Continue to check blood sugars as scheduled.  Diabetes Meal Planning Guide The diabetes meal planning guide is a tool to help you plan your meals and snacks. It is important for people with diabetes to manage their blood glucose (sugar) levels. Choosing the right foods and the right amounts throughout your day will help control your blood glucose. Eating right can even help you improve your blood pressure and reach or maintain a healthy weight. CARBOHYDRATE COUNTING MADE EASY When you eat carbohydrates, they turn to sugar. This raises your blood glucose level. Counting carbohydrates can help you control this level so you feel better. When you plan your meals by counting carbohydrates, you can have more flexibility in what you eat and balance your medicine with your food intake. Carbohydrate counting simply means adding up the total amount of carbohydrate grams in your meals and snacks. Try to eat about the same amount at each meal. Foods with carbohydrates are listed below. Each portion below is 1 carbohydrate serving or 15 grams of carbohydrates. Ask your dietician how many grams of carbohydrates you should eat at each meal or snack. Grains and Starches  1 slice bread.    English muffin or hotdog/hamburger bun.    cup cold cereal (unsweetened).   ? cup cooked pasta or rice.    cup starchy vegetables (corn, potatoes, peas, beans, winter squash).   1 tortilla (6 inches).    bagel.   1 waffle or pancake (size of a CD).    cup cooked cereal.   4 to 6 small crackers.  *Whole grain is recommended. Fruit  1 cup fresh unsweetened berries, melon, papaya, pineapple.   1 small fresh fruit.    banana or mango.    cup fruit juice (4 oz unsweetened).    cup canned fruit in natural juice or water.   2 tbs dried fruit.   12 to 15 grapes or cherries.  Milk and Yogurt  1 cup fat-free or 1% milk.   1 cup soy milk.   6 oz light yogurt with  sugar-free sweetener.   6 oz low-fat soy yogurt.   6 oz plain yogurt.  Vegetables  1 cup raw or  cup cooked is counted as 0 carbohydrates or a "free" food.   If you eat 3 or more servings at 1 meal, count them as 1 carbohydrate serving.  Other Carbohydrates   oz chips or pretzels.    cup ice cream or frozen yogurt.    cup sherbet or sorbet.   2 inch square cake, no frosting.   1 tbs honey, sugar, jam, jelly, or syrup.   2 small cookies.   3 squares of graham crackers.   3 cups popcorn.   6 crackers.   1 cup broth-based soup.   Count 1 cup casserole or other mixed foods as 2 carbohydrate servings.   Foods with less than 20 calories in a serving may be counted as 0 carbohydrates or a "free" food.  You may want to purchase a book or computer software that lists the carbohydrate gram counts of different foods. In addition, the nutrition facts panel on the labels of the foods you eat are a good source of this information. The label will tell you how big the serving size is and the total number of carbohydrate grams you will be eating per serving. Divide this number by 15 to obtain the number of carbohydrate servings in a portion. Remember, 1 carbohydrate serving  equals 15 grams of carbohydrate. SERVING SIZES Measuring foods and serving sizes helps you make sure you are getting the right amount of food. The list below tells how big or small some common serving sizes are.  1 oz.........4 stacked dice.   3 oz........Marland KitchenDeck of cards.   1 tsp.......Marland KitchenTip of little finger.   1 tbs......Marland KitchenMarland KitchenThumb.   2 tbs.......Marland KitchenGolf ball.    cup......Marland KitchenHalf of a fist.   1 cup.......Marland KitchenA fist.  SAMPLE DIABETES MEAL PLAN Below is a sample meal plan that includes foods from the grain and starches, dairy, vegetable, fruit, and meat groups. A dietician can individualize a meal plan to fit your calorie needs and tell you the number of servings needed from each food group. However, controlling the  total amount of carbohydrates in your meal or snack is more important than making sure you include all of the food groups at every meal. You may interchange carbohydrate containing foods (dairy, starches, and fruits). The meal plan below is an example of a 2000 calorie diet using carbohydrate counting. This meal plan has 17 carbohydrate servings. Breakfast  1 cup oatmeal (2 carb servings).    cup light yogurt (1 carb serving).   1 cup blueberries (1 carb serving).    cup almonds.  Snack  1 large apple (2 carb servings).   1 low-fat string cheese stick.  Lunch  Chicken breast salad.   1 cup spinach.    cup chopped tomatoes.   2 oz chicken breast, sliced.   2 tbs low-fat [place] dressing.   12 whole-wheat crackers (2 carb servings).   12 to 15 grapes (1 carb serving).   1 cup low-fat milk (1 carb serving).  Snack  1 cup carrots.    cup hummus (1 carb serving).  Dinner  3 oz broiled salmon.   1 cup brown rice (3 carb servings).  Snack  1  cups steamed broccoli (1 carb serving) drizzled with 1 tsp olive oil and lemon juice.   1 cup light pudding (2 carb servings).  DIABETES MEAL PLANNING WORKSHEET Your dietician can use this worksheet to help you decide how many servings of foods and what types of foods are right for you.  BREAKFAST Food Group and Servings / Carb Servings Grain/Starches __________________________________ Dairy __________________________________________ Vegetable ______________________________________ Fruit ___________________________________________ Meat __________________________________________ Fat ____________________________________________ LUNCH Food Group and Servings / Carb Servings Grain/Starches ___________________________________ Dairy ___________________________________________ Fruit ____________________________________________ Meat ___________________________________________ Fat  _____________________________________________ Jason [name] Food Group and Servings / Carb Servings Grain/Starches ___________________________________ Dairy ___________________________________________ Fruit ____________________________________________ Meat ___________________________________________ Fat _____________________________________________ SNACKS Food Group and Servings / Carb Servings Grain/Starches ___________________________________ Dairy ___________________________________________ Vegetable _______________________________________ Fruit ____________________________________________ Meat ___________________________________________ Fat _____________________________________________ DAILY TOTALS Starches _________________________ Vegetable ________________________ Fruit ____________________________ Dairy ____________________________ Meat ____________________________ Fat ______________________________ Document Released: 06/21/2005 Document Revised: 06/06/2011 Document Reviewed: 05/02/2009 ExitCare Patient Information 2012 [place], [place].

## 2011-11-27 NOTE — Progress Notes (Signed)
Subjective:    Patient ID: Jason [name], male    DOB: 07/22/1986, 26 y.o.   MRN: [id number]  HPI 26 year old African American male, nonsmoker, patient of Dr. Clent Ridges is in for recheck of type 1 diabetes. His last office visit his blood sugars were reading high. BMP revealed a blood sugar in the 700s. The patient was stabilized before leaving that last office visit blood sugar at that time was in the upper 300s. At that time he was started on Humulin 75/2515 units daily and a sliding scale. His blood sugars have improved tremendously overall he's had no blood sugars greater than 300. Fasting blood sugars are in the 200 and around lunch he is in the upper 100. He denies any increase in urination or thirst, no lightheadedness, dizziness, chest pain, palpitations, shortness of breath or edema   Review of Systems  Constitutional: Negative.   HENT: Negative.   Respiratory: Negative.   Cardiovascular: Negative.   Gastrointestinal: Negative.   Musculoskeletal: Negative.   Skin: Negative.   Hematological: Negative.   Psychiatric/Behavioral: Negative.    Past Medical History  Diagnosis Date  . Diabetes mellitus   . GERD (gastroesophageal reflux disease)   . Asthma   . Hx MRSA infection     on face    History   Social History  . Marital Status: Single    Spouse Name: N/A    Number of Children: N/A  . Years of Education: N/A   Occupational History  . Not on file.   Social History Main Topics  . Smoking status: Current Some Day Smoker  . Smokeless tobacco: Not on file  . Alcohol Use: No  . Drug Use: No  . Sexually Active: Not on file   Other Topics Concern  . Not on file   Social History Narrative  . No narrative on file    Past Surgical History  Procedure Date  . Tonsillectomy     Family History  Problem Relation Age of Onset  . Diabetes Father   . Asthma      fhx  . Hypertension      fhx  . Stroke      fhx    Allergies  Allergen Reactions  . Cefuroxime  Axetil   . Penicillins     Current Outpatient Prescriptions on File Prior to Visit  Medication Sig Dispense Refill  . insulin lispro (HUMALOG) 100 UNIT/ML injection Sliding scale  10 mL  12  . insulin lispro protamine-insulin lispro (HUMALOG MIX 75/25) (75-25) 100 UNIT/ML SUSP Inject 15 Units into the skin daily with supper.  10 mL  12    BP 120/88  Temp(Src) 97.5 F (36.4 C) (Oral)  Wt 148 lb (67.132 kg)chart    Objective:   Physical Exam  Constitutional: He is oriented to person, place, and time. He appears well-developed and well-nourished.  HENT:  Right Ear: External ear normal.  Left Ear: External ear normal.  Nose: Nose normal.  Mouth/Throat: Oropharynx is clear and moist.  Cardiovascular: Normal rate, regular rhythm and normal heart sounds.   Pulmonary/Chest: Effort normal and breath sounds normal.  Abdominal: Soft. Bowel sounds are normal.  Musculoskeletal: Normal range of motion.  Neurological: He is alert and oriented to person, place, and time.  Skin: Skin is warm and dry.  Psychiatric: He has a normal mood and affect.          Assessment & Plan:  Assessment: Type 1 diabetes-uncontrolled  Plan: Increase Humalog 75/25-20 units daily.  Continue sliding scale insulin. Encouraged healthy diet and exercise. We'll bring patient back for recheck in 3 weeks and sooner when necessary.

## 2011-12-18 ENCOUNTER — Ambulatory Visit (INDEPENDENT_AMBULATORY_CARE_PROVIDER_SITE_OTHER): Payer: Self-pay | Admitting: Family

## 2011-12-18 ENCOUNTER — Encounter: Payer: Self-pay | Admitting: Family

## 2011-12-18 DIAGNOSIS — E1165 Type 2 diabetes mellitus with hyperglycemia: Secondary | ICD-10-CM

## 2011-12-18 DIAGNOSIS — R51 Headache: Secondary | ICD-10-CM

## 2011-12-18 LAB — POCT URINALYSIS DIPSTICK
Leukocytes, UA: NEGATIVE
Nitrite, UA: NEGATIVE
Urobilinogen, UA: 0.2

## 2011-12-18 LAB — BASIC METABOLIC PANEL
Calcium: 9.7 mg/dL (ref 8.4–10.5)
GFR: 101.86 mL/min (ref >60.00)
Sodium: 138 mEq/L (ref 135–145)

## 2011-12-18 NOTE — Progress Notes (Signed)
  Subjective:    Patient ID: Jason [name], male    DOB: 1986/02/04, 26 y.o.   MRN: [id number]  HPI Comments: C/o new onset lightheadedness when sitting to standing x 1.5 weeks. Denies etoh or drug use. Stating took blood sugar at time of incidence and was 125 and another time 200. Has history noncompliance with type I diabetes and taking insulin. Has intermittent headaches, denies pain at present. Denies loc, cp, tremors, seizures, speech difficulty, or weakness.      Review of Systems  Constitutional: Negative.   Respiratory: Negative.   Cardiovascular: Negative.   Neurological: Positive for light-headedness and headaches. Negative for dizziness, tremors, seizures, syncope, facial asymmetry, speech difficulty and numbness.   Past Medical History  Diagnosis Date  . Diabetes mellitus   . GERD (gastroesophageal reflux disease)   . Asthma   . Hx MRSA infection     on face    History   Social History  . Marital Status: Single    Spouse Name: N/A    Number of Children: N/A  . Years of Education: N/A   Occupational History  . Not on file.   Social History Main Topics  . Smoking status: Current Some Day Smoker  . Smokeless tobacco: Not on file  . Alcohol Use: No  . Drug Use: No  . Sexually Active: Not on file   Other Topics Concern  . Not on file   Social History Narrative  . No narrative on file    Past Surgical History  Procedure Date  . Tonsillectomy     Family History  Problem Relation Age of Onset  . Diabetes Father   . Asthma      fhx  . Hypertension      fhx  . Stroke      fhx    Allergies  Allergen Reactions  . Cefuroxime Axetil   . Penicillins     Current Outpatient Prescriptions on File Prior to Visit  Medication Sig Dispense Refill  . insulin lispro (HUMALOG) 100 UNIT/ML injection Sliding scale  10 mL  12  . insulin lispro protamine-insulin lispro (HUMALOG MIX 75/25) (75-25) 100 UNIT/ML SUSP Inject 15 Units into the skin daily with  supper.  10 mL  12    BP 116/72  Temp(Src) 98.4 F (36.9 C) (Oral)  Wt 138 lb (62.596 kg)chart    Objective:   Physical Exam  Constitutional: He is oriented to person, place, and time. He appears well-developed and well-nourished. No distress.  Cardiovascular: Normal rate, regular rhythm, normal heart sounds and intact distal pulses.  Exam reveals no gallop and no friction rub.   No murmur heard. Pulmonary/Chest: Effort normal and breath sounds normal. No respiratory distress. He has no wheezes. He has no rales. He exhibits no tenderness.  Neurological: He is alert and oriented to person, place, and time.  Skin: Skin is warm and dry. He is not diaphoretic.  Psychiatric: He has a normal mood and affect. His behavior is normal.          Assessment & Plan:  Assessment: Type 2 diabetes-uncontrolled, probable-Dehydration, headache  Plan: Labs: BMP, urinalysis Teaching handouts provided on diagnosis. Encouraged to RTC if s/s get worse.

## 2012-01-13 ENCOUNTER — Encounter (HOSPITAL_BASED_OUTPATIENT_CLINIC_OR_DEPARTMENT_OTHER): Payer: Self-pay | Admitting: *Deleted

## 2012-01-13 ENCOUNTER — Emergency Department (HOSPITAL_BASED_OUTPATIENT_CLINIC_OR_DEPARTMENT_OTHER)
Admission: EM | Admit: 2012-01-13 | Discharge: 2012-01-14 | Disposition: A | Discharge status: Home / Self Care | Payer: Self-pay | Attending: Emergency Medicine | Admitting: Emergency Medicine

## 2012-01-13 DIAGNOSIS — Z8614 Personal history of Methicillin resistant Staphylococcus aureus infection: Secondary | ICD-10-CM | POA: Insufficient documentation

## 2012-01-13 DIAGNOSIS — Z794 Long term (current) use of insulin: Secondary | ICD-10-CM | POA: Insufficient documentation

## 2012-01-13 DIAGNOSIS — J45909 Unspecified asthma, uncomplicated: Secondary | ICD-10-CM | POA: Insufficient documentation

## 2012-01-13 DIAGNOSIS — F17200 Nicotine dependence, unspecified, uncomplicated: Secondary | ICD-10-CM | POA: Insufficient documentation

## 2012-01-13 DIAGNOSIS — K0889 Other specified disorders of teeth and supporting structures: Secondary | ICD-10-CM

## 2012-01-13 DIAGNOSIS — S025XXA Fracture of tooth (traumatic), initial encounter for closed fracture: Secondary | ICD-10-CM | POA: Insufficient documentation

## 2012-01-13 DIAGNOSIS — K219 Gastro-esophageal reflux disease without esophagitis: Secondary | ICD-10-CM | POA: Insufficient documentation

## 2012-01-13 DIAGNOSIS — X58XXXA Exposure to other specified factors, initial encounter: Secondary | ICD-10-CM | POA: Insufficient documentation

## 2012-01-13 NOTE — ED Notes (Signed)
Pt states that he thinks he has a broken tooth states that tooth is dangling pt pain right upper jaw

## 2012-01-14 MED ORDER — HYDROCODONE-ACETAMINOPHEN 5-500 MG PO TABS: 1.0000 | ORAL_TABLET | Freq: Four times a day (QID) | ORAL | Status: AC | PRN

## 2012-01-14 MED ORDER — ERYTHROMYCIN BASE 333 MG PO TBEC
333.0000 mg | DELAYED_RELEASE_TABLET | Freq: Four times a day (QID) | ORAL | Status: DC
  Filled 2012-01-14: qty 1

## 2012-01-14 MED ORDER — HYDROCODONE-ACETAMINOPHEN 5-325 MG PO TABS
2.0000 | ORAL_TABLET | Freq: Once | ORAL | Status: AC
  Administered 2012-01-14: 2 via ORAL
  Filled 2012-01-14: qty 2

## 2012-01-14 MED ORDER — ERYTHROMYCIN BASE 250 MG PO TABS: 333.0000 mg | ORAL_TABLET | Freq: Four times a day (QID) | ORAL | Status: AC

## 2012-01-14 NOTE — ED Notes (Signed)
Pt presents to ED today with c/o teeth pain.  For the last several days.  Pt does not have a dentist

## 2012-01-14 NOTE — Discharge Instructions (Signed)
Dental Pain A tooth ache may be caused by cavities (tooth decay). Cavities expose the nerve of the tooth to air and hot or cold temperatures. It may come from an infection or abscess (also called a boil or furuncle) around your tooth. It is also often caused by dental caries (tooth decay). This causes the pain you are having. DIAGNOSIS  Your caregiver can diagnose this problem by exam. TREATMENT   If caused by an infection, it may be treated with medications which kill germs (antibiotics) and pain medications as prescribed by your caregiver. Take medications as directed.   Only take over-the-counter or prescription medicines for pain, discomfort, or fever as directed by your caregiver.   Whether the tooth ache today is caused by infection or dental disease, you should see your dentist as soon as possible for further care.  SEEK MEDICAL CARE IF: The exam and treatment you received today has been provided on an emergency basis only. This is not a substitute for complete medical or dental care. If your problem worsens or new problems (symptoms) appear, and you are unable to meet with your dentist, call or return to this location. SEEK IMMEDIATE MEDICAL CARE IF:   You have a fever.   You develop redness and swelling of your face, jaw, or neck.   You are unable to open your mouth.   You have severe pain uncontrolled by pain medicine.  MAKE SURE YOU:   Understand these instructions.   Will watch your condition.   Will get help right away if you are not doing well or get worse.  Document Released: 09/24/2005 Document Revised: 09/13/2011 Document Reviewed: 05/12/2008 ExitCare Patient Information 2012 [employer].  RESOURCE GUIDE  Dental Problems  Patients with Medicaid: [facility]                     [facility] [address]                                           [address] Phone:  [phone]                                                   Phone:  [phone]  If unable to pay or uninsured, contact:  [facility] or [facility] to become qualified for the adult dental clinic.  Chronic Pain Problems Contact [facility]  [phone] Patients need to be referred by their primary care doctor.  Insufficient Money for Medicine Contact [employer]:  call "211" or [facility] 271-5999.  No Primary Care Doctor Call [facility]  832-8000 Other agencies that provide inexpensive medical care    [facility]  [phone]    [facility]  [phone]    [facility]  [phone]    [facility] Clinic  [phone]    [facility]  [phone]    [facility]  [phone]  Psychological Services  Health  [phone] [facility]  [phone] [facility]   800 853-5163 (emergency services 641-4993)  Substance Abuse Resources [facility]  336-882-2125 [facility] 336-784-9470 [facility] 336-285-9073 [facility]   336-845-3988 Residential & Outpatient Substance Abuse Program  800-659-3381  Abuse/Neglect [place] Child Abuse Hotline (336) 641-3795 [place] [facility] Abuse Hotline 800-378-5315 (After Hours)  Emergency Shelter [facility] (336) 271-5985  [facility] (336) 275-9566 [facility] (704) 372-4663  MRSA Hotline #:   832-7006    [place] Resources  [facility] [place]     [employer]                          [facility] [address] [place]                       [address]      [address]  [place]                                                [place]                            [place] Phone:  [phone]                                   Phone:  [phone]                 Phone:  [phone]  [facility] Phone:   [phone]  [place] Child Abuse Hotline (336) 342-1394 (336) 342-3537 (After Hours)   

## 2012-01-14 NOTE — ED Provider Notes (Signed)
History   This chart was scribed for Geoffery Lyons, MD by Charolett Bumpers . The patient was seen in room MHH2/MHH2 and the patient's care was started at 12:27pm.    CSN: [id number]  Arrival date & time 01/13/12  2313   None     No chief complaint on file.   (Consider location/radiation/quality/duration/timing/severity/associated sxs/prior treatment) HPI Jason [name] is a 26 y.o. male who presents to the Emergency Department complaining of constant, moderate right upper tooth pain since last night. Patient states that he was eating a hard candy when inside of this tooth broke a couple months ago. Patient states that last night, he was eating when the outside of the tooth broke and reports associated sharp pain. Patient states that he clinched his teeth last night when more of the tooth broke and pain radiated into his jaw. Patient states that he does not have a dentist.   Past Medical History  Diagnosis Date  . Diabetes mellitus   . GERD (gastroesophageal reflux disease)   . Asthma   . Hx MRSA infection     on face    Past Surgical History  Procedure Date  . Tonsillectomy     Family History  Problem Relation Age of Onset  . Diabetes Father   . Asthma      fhx  . Hypertension      fhx  . Stroke      fhx    History  Substance Use Topics  . Smoking status: Current Some Day Smoker  . Smokeless tobacco: Not on file  . Alcohol Use: No      Review of Systems A complete 10 system review of systems was obtained and all systems are negative except as noted in the HPI and PMH.   Allergies  Cefuroxime axetil and Penicillins  Home Medications   Current Outpatient Rx  Name Route Sig Dispense Refill  . DIPHENHYDRAMINE HCL 25 MG PO TABS Oral Take 25 mg by mouth every 6 (six) hours as needed. For allergies    . VICODIN PO Oral Take 1 tablet by mouth every 4 (four) hours as needed. For pain    . IBUPROFEN 800 MG PO TABS Oral Take 800 mg by mouth every 8 (eight)  hours as needed. For pain    . INSULIN LISPRO (HUMAN) 100 UNIT/ML [place] SOLN Subcutaneous Inject 2-15 Units into the skin 3 (three) times daily. Sliding scale with every meal    . INSULIN LISPRO PROT & LISPRO (75-25) 100 UNIT/ML [place] SUSP Subcutaneous Inject 20 Units into the skin daily with supper.    Marland Kitchen METOCLOPRAMIDE HCL 10 MG PO TABS Oral Take 10 mg by mouth 4 (four) times daily as needed. To block bile duct    . OMEPRAZOLE-SODIUM BICARBONATE 20-1100 MG PO CAPS Oral Take 1 capsule by mouth daily.    . ERYTHROMYCIN BASE 250 MG PO TABS Oral Take 1.5 tablets (375 mg total) by mouth every 6 (six) hours. 28 tablet 0    BP 118/82  Pulse 97  Temp(Src) 98.2 F (36.8 C) (Oral)  Resp 16  SpO2 100%  Physical Exam  Nursing note and vitals reviewed. Constitutional: He is oriented to person, place, and time. He appears well-developed and well-nourished. No distress.  HENT:  Head: Normocephalic and atraumatic.  Right Ear: External ear normal.  Left Ear: External ear normal.  Nose: Nose normal.       Right upper bicuspid is cracked. Tooth is loose in socket. Tenderness  noted.   Eyes: Conjunctivae and EOM are normal. Pupils are equal, round, and reactive to light.  Neck: Normal range of motion. Neck supple. No tracheal deviation present.  Cardiovascular: Normal rate.   Pulmonary/Chest: Effort normal. No respiratory distress.  Abdominal: Soft. He exhibits no distension.  Musculoskeletal: Normal range of motion. He exhibits no edema.  Neurological: He is alert and oriented to person, place, and time. No sensory deficit.  Skin: Skin is warm and dry.  Psychiatric: He has a normal mood and affect. His behavior is normal.    ED Course  Procedures (including critical care time)  DIAGNOSTIC STUDIES: Oxygen Saturation is 100% on room air, normal by my interpretation.    COORDINATION OF CARE:  0030: Discussed planned course of treatment with the patient. Discussed f/u with dentist.    Labs Reviewed  - No data to display No results found.   1. Pain, dental       MDM  Has broken tooth with loose fragment.  Needs to see dentist.  Will treat with pain meds, antibiotics for now.     I personally performed the services described in this documentation, which was scribed in my presence. The recorded information has been reviewed and considered.         Geoffery Lyons, MD 01/14/12 [phone]

## 2012-02-01 IMAGING — CR DG CHEST 2V
2 series · 2 of 2 positions shown · non-contrast
Comparison: None.

CLINICAL DATA: Chest pain

CHEST - 2 VIEW

[w chest pa]
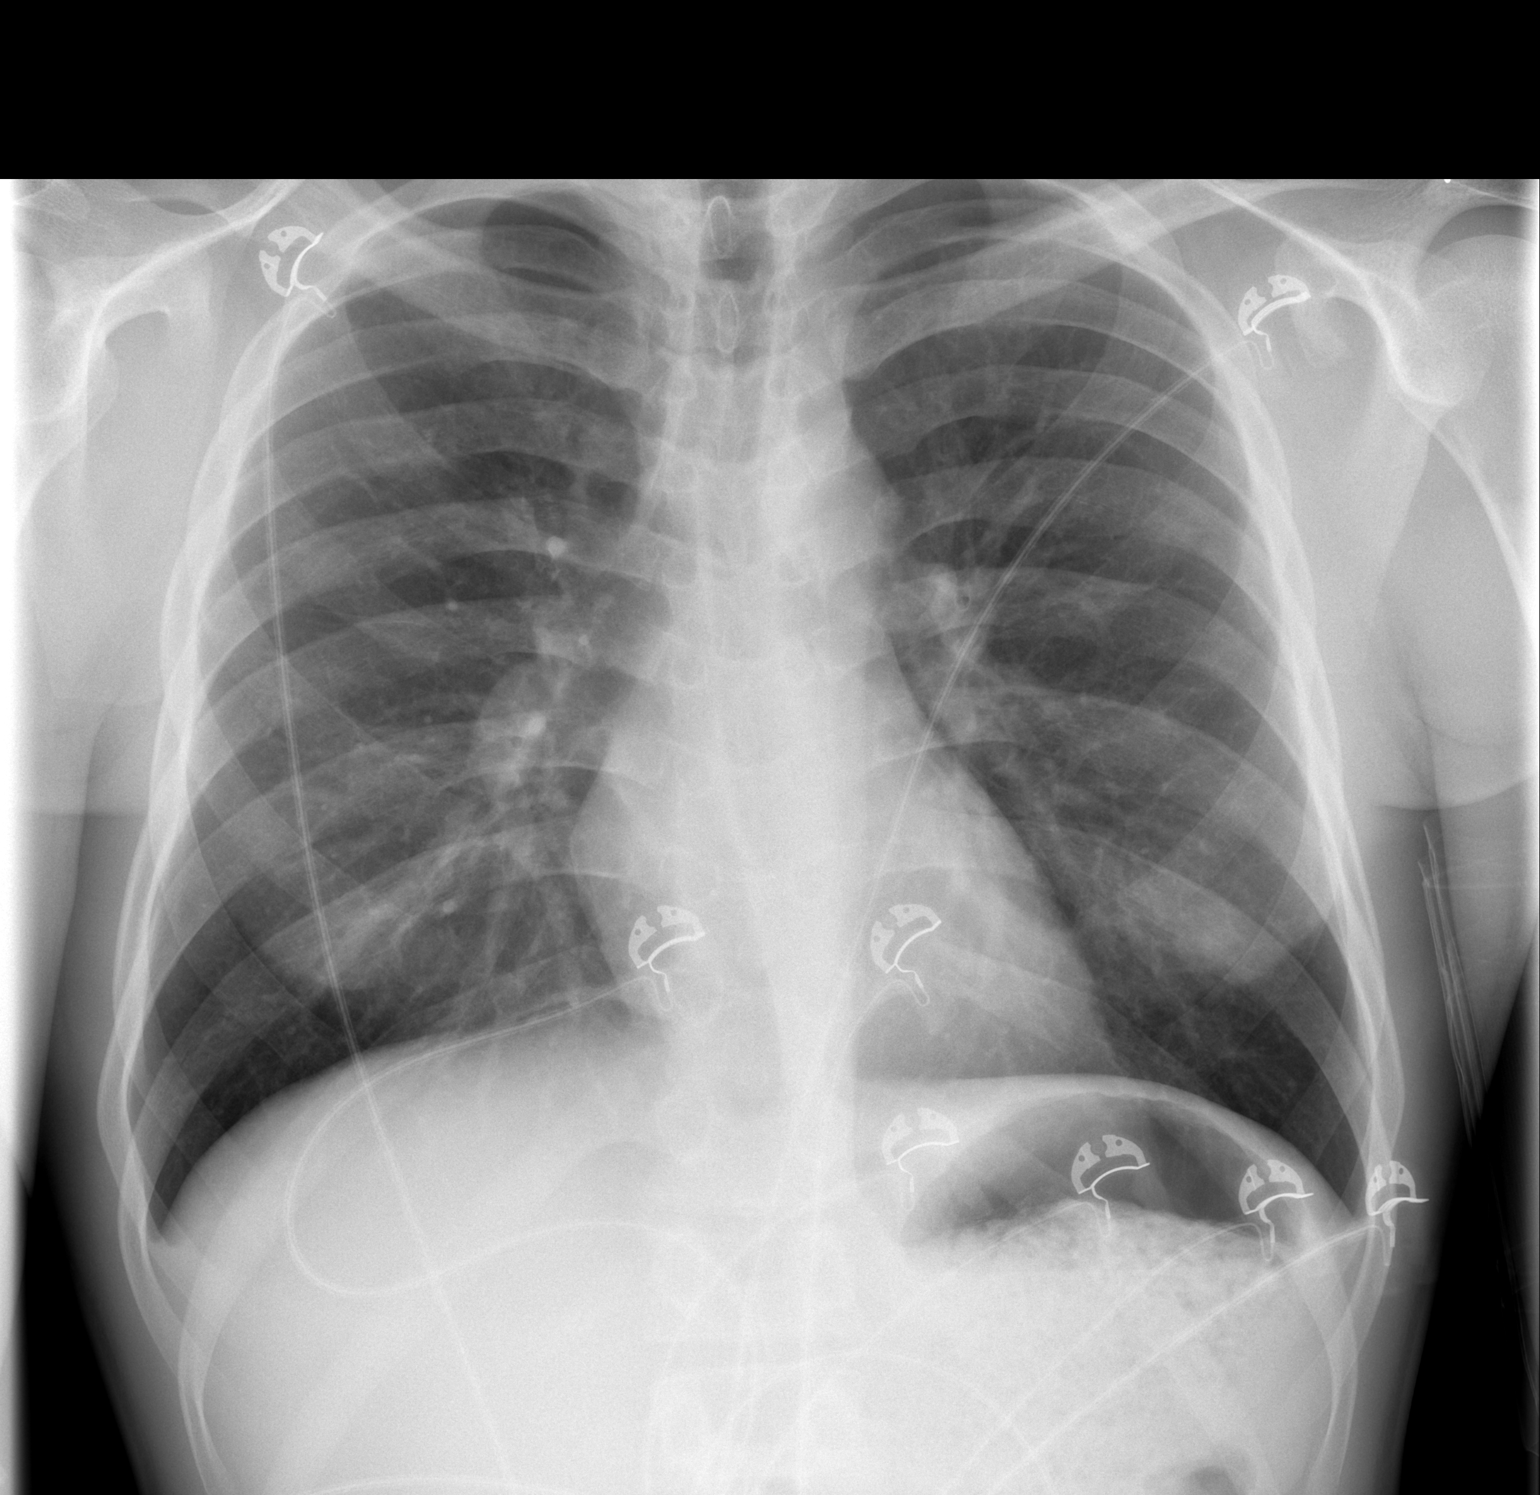

[w chest lat]
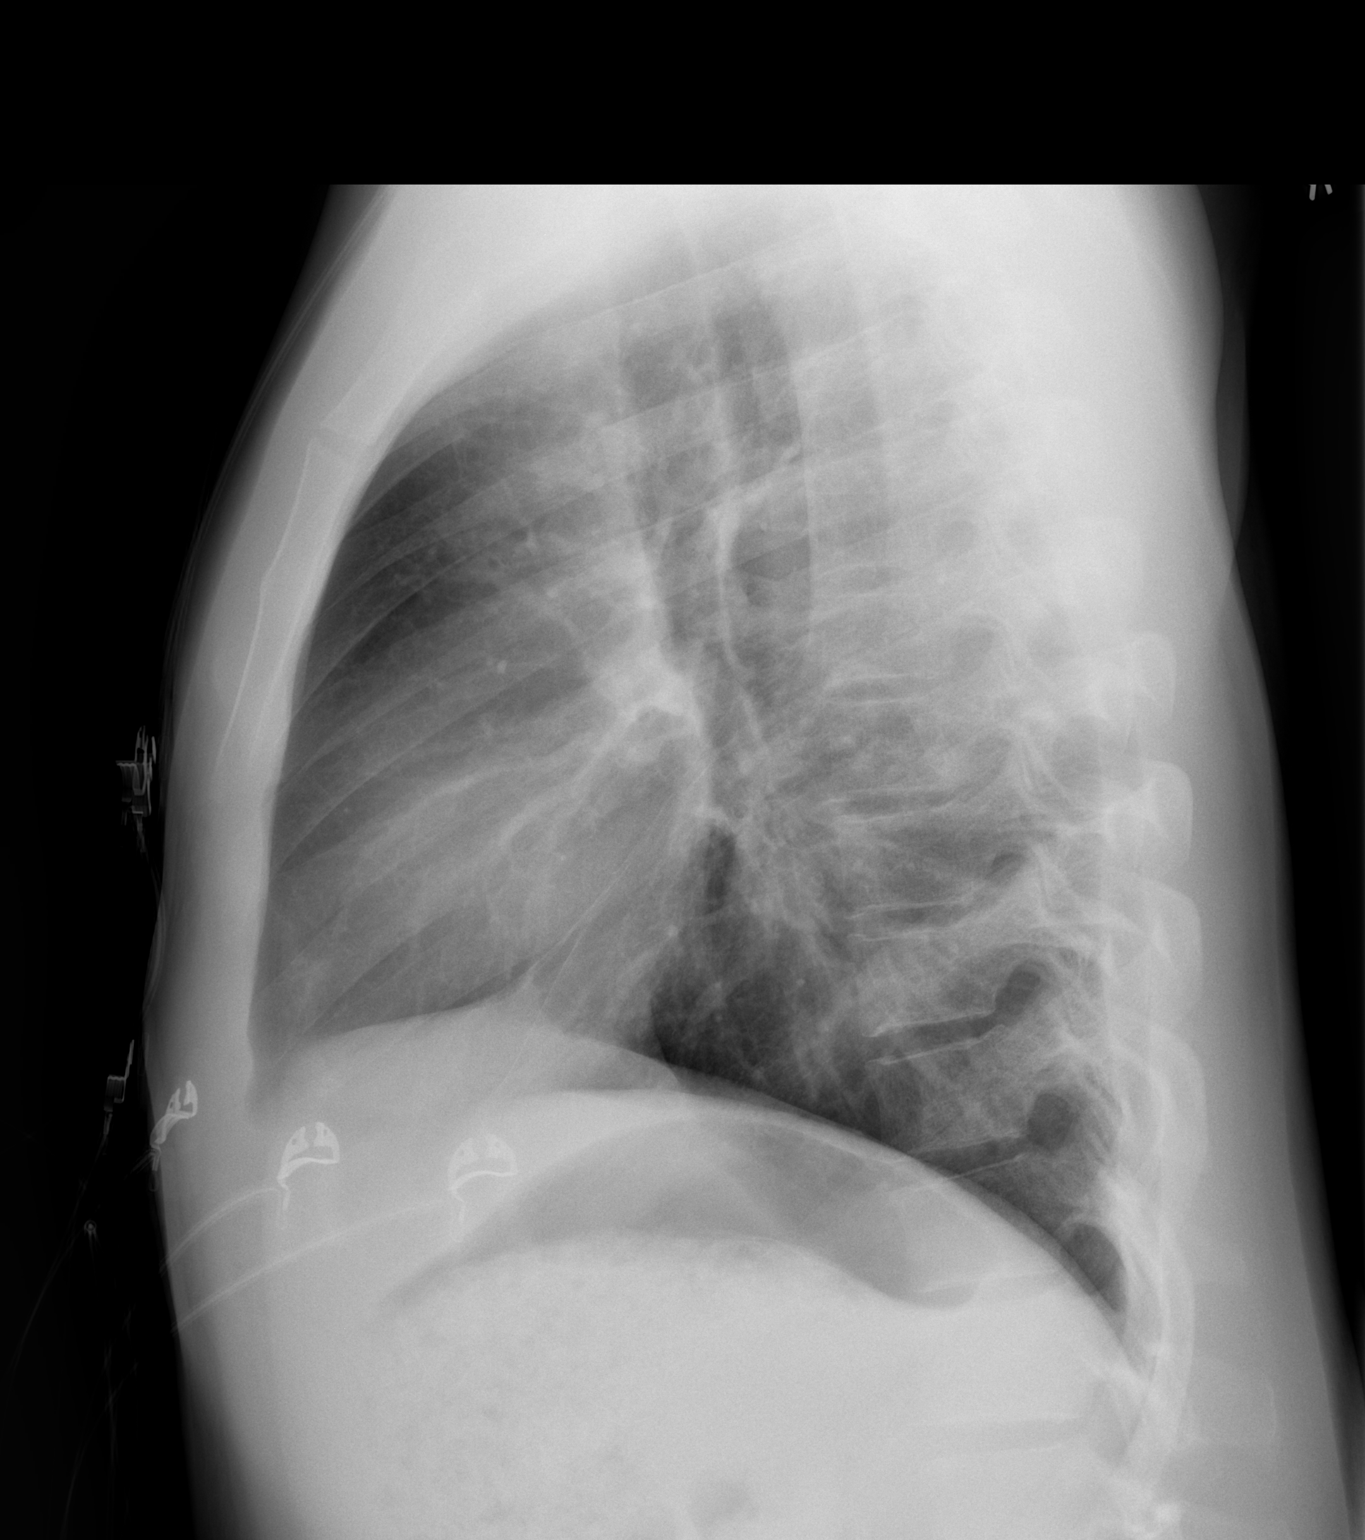

[2 of 2 positions shown; findings below may reference images not displayed]

FINDINGS: Normal mediastinum and cardiac silhouette.  Costophrenic
angles are clear.  No evidence effusion , infiltrates, or
pneumothorax.  No significant bony abnormality.
IMPRESSION: Normal chest radiograph.

## 2012-02-15 ENCOUNTER — Ambulatory Visit: Payer: Self-pay | Admitting: Family

## 2012-02-15 DIAGNOSIS — Z0289 Encounter for other administrative examinations: Secondary | ICD-10-CM

## 2012-03-19 IMAGING — CR DG KNEE COMPLETE 4+V*L*
4 series · 4 of 4 positions shown · non-contrast
Comparison: None

CLINICAL DATA: Left knee injury and pain.

LEFT KNEE - COMPLETE 4+ VIEW

[t knee ap left]
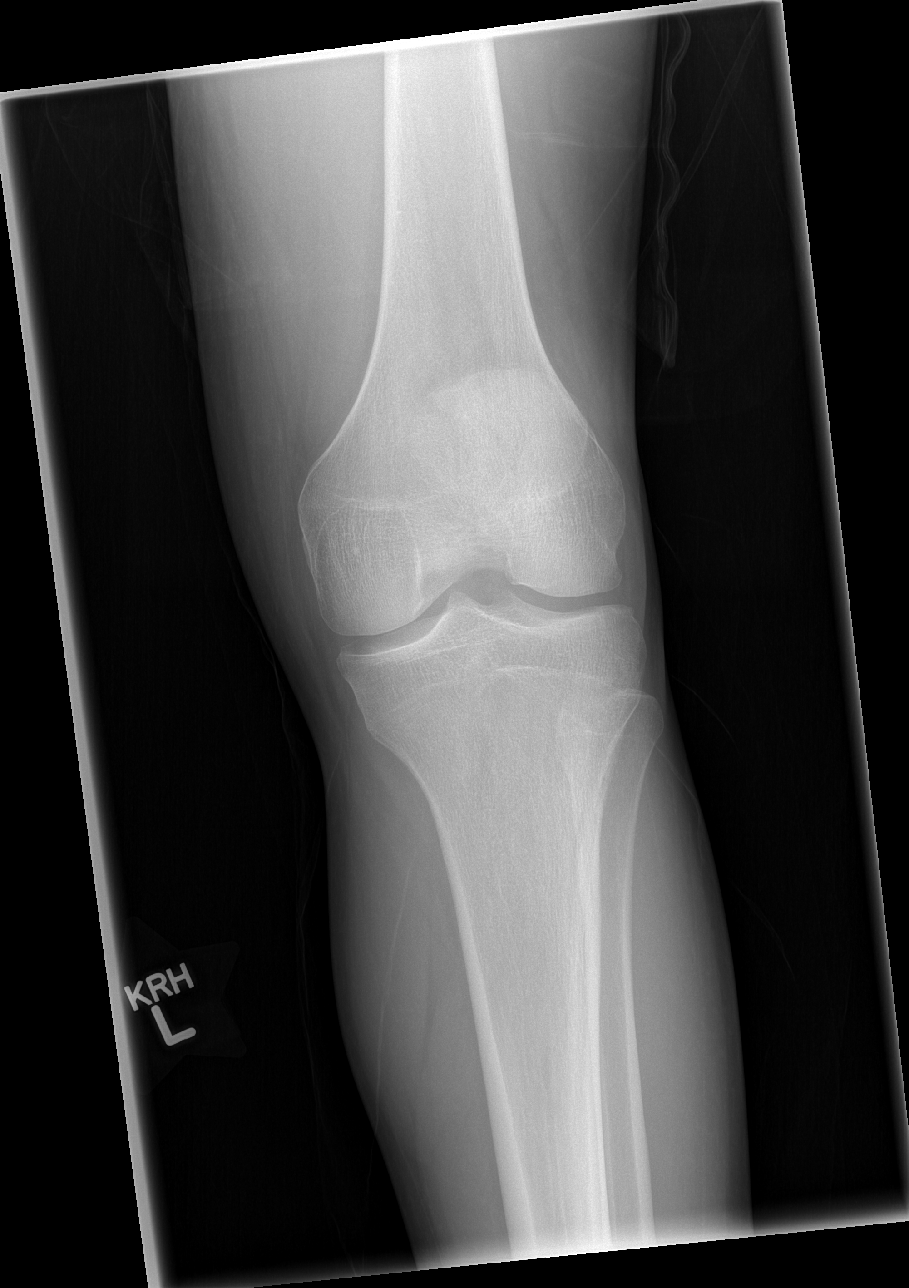

[t knee oblique left (1 of 2)]
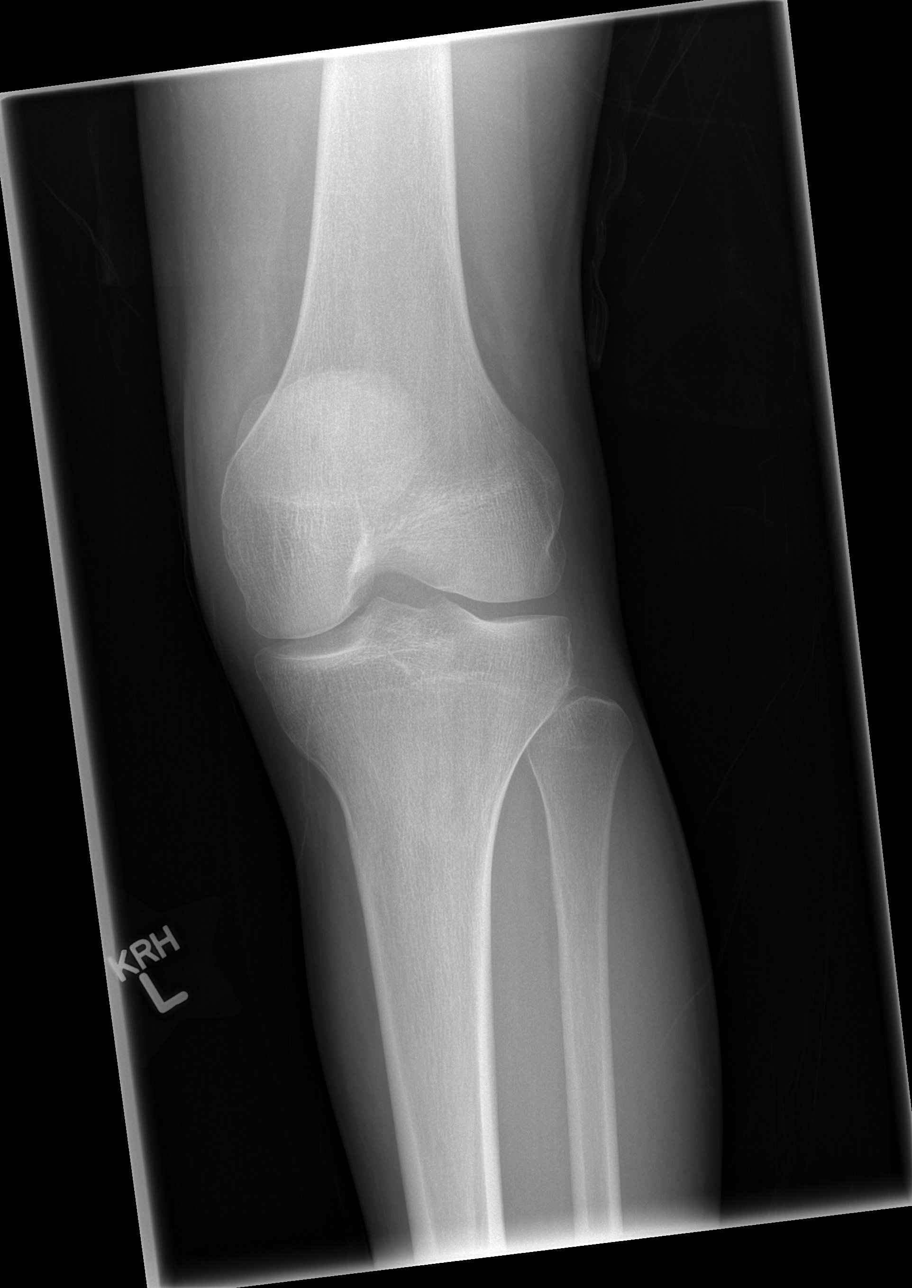

[t knee oblique left (2 of 2)]
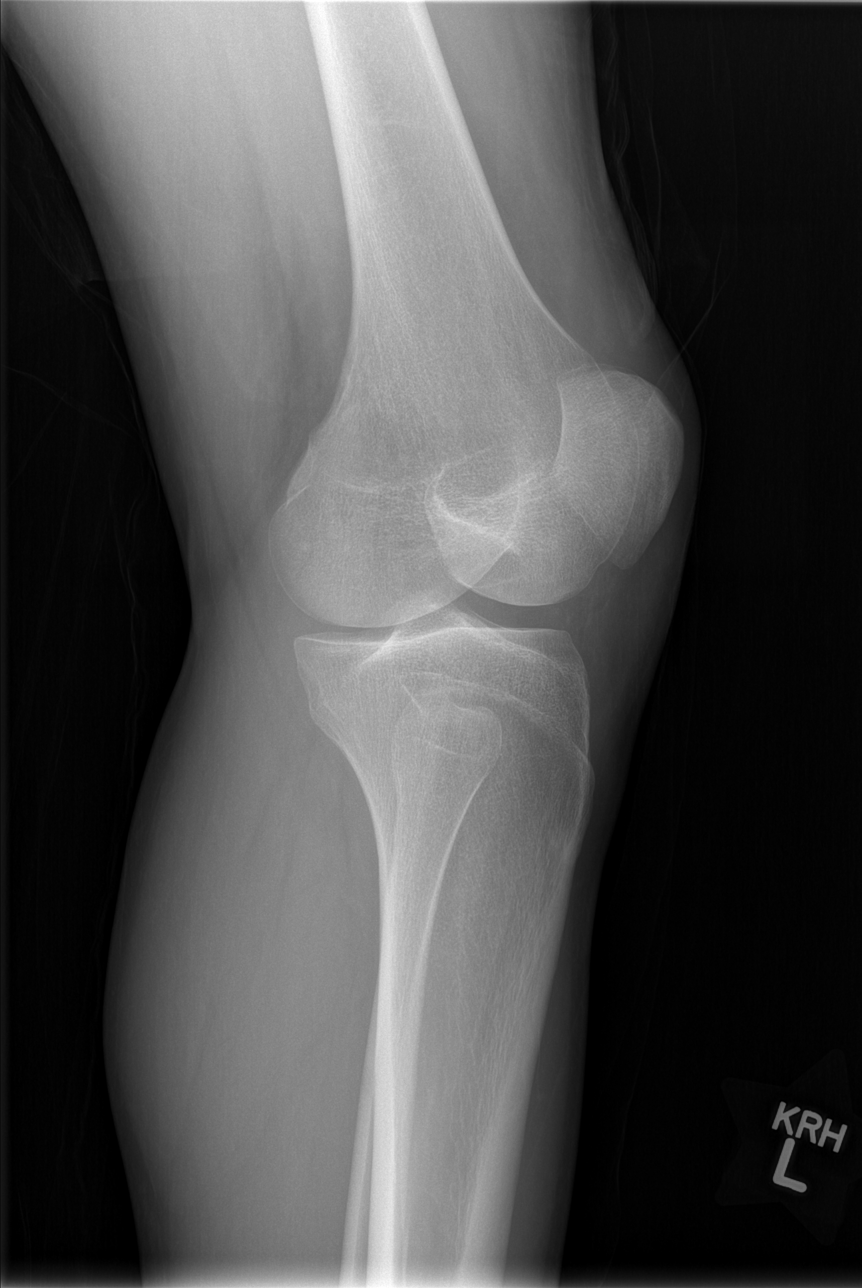

[t knee lat left]
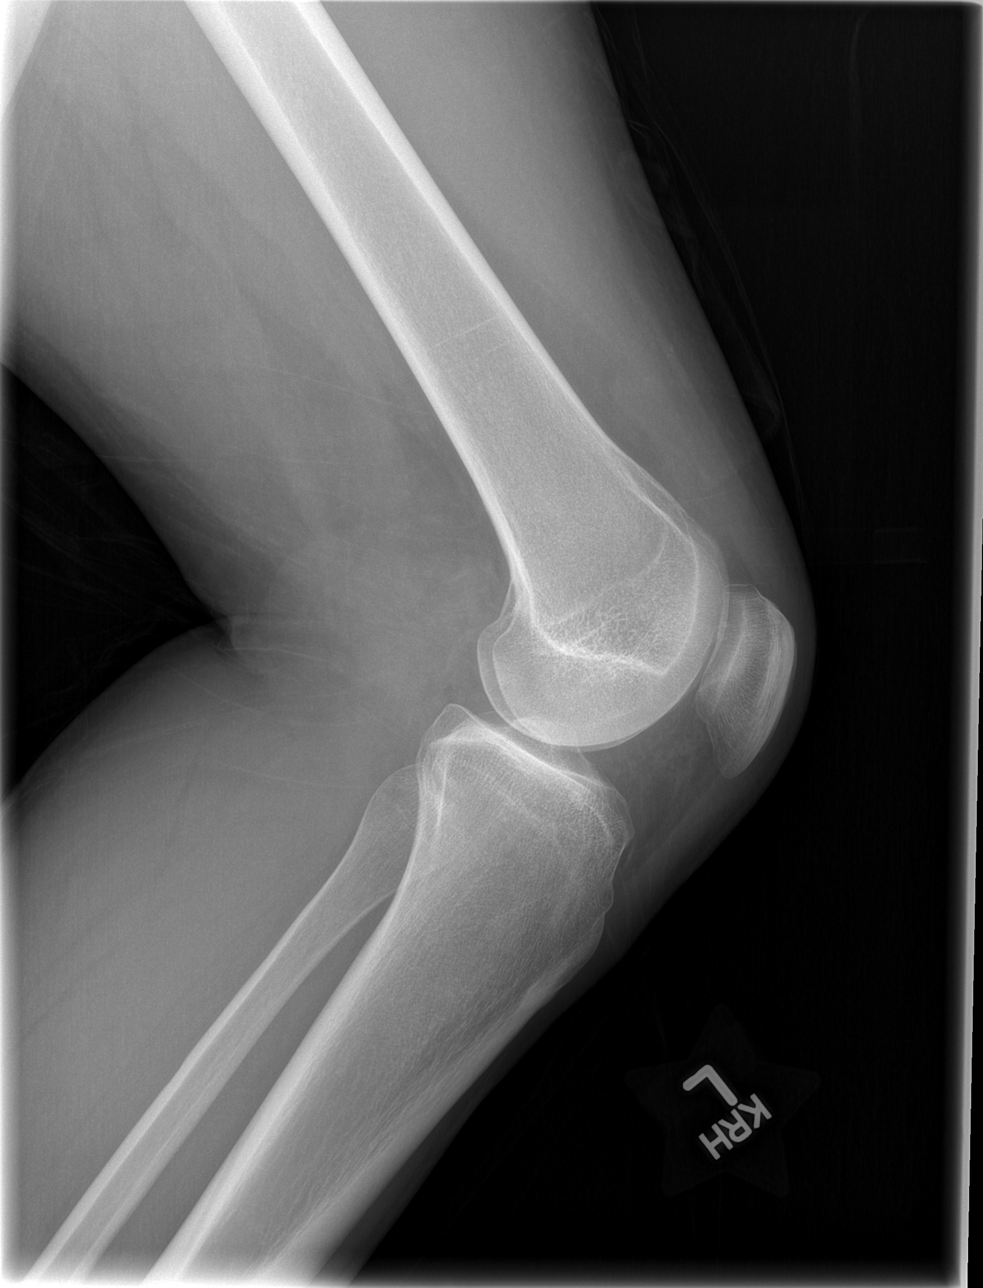

[4 of 4 positions shown; findings below may reference images not displayed]

FINDINGS: There is no evidence of acute fracture, subluxation, or
dislocation.
There may be a very small effusion present.
The joint spaces are otherwise unremarkable.
No focal bony lesions are identified.
IMPRESSION: Question very small knee effusion - no acute bony abnormality.

## 2012-04-25 IMAGING — CR DG CHEST 1V PORT
1 series · 1 of 1 positions shown · non-contrast
Comparison: 11/23/2009

CLINICAL DATA: Diabetic ketoacidosis.  Evaluate for pneumonia.

PORTABLE CHEST - 1 VIEW

[AP]
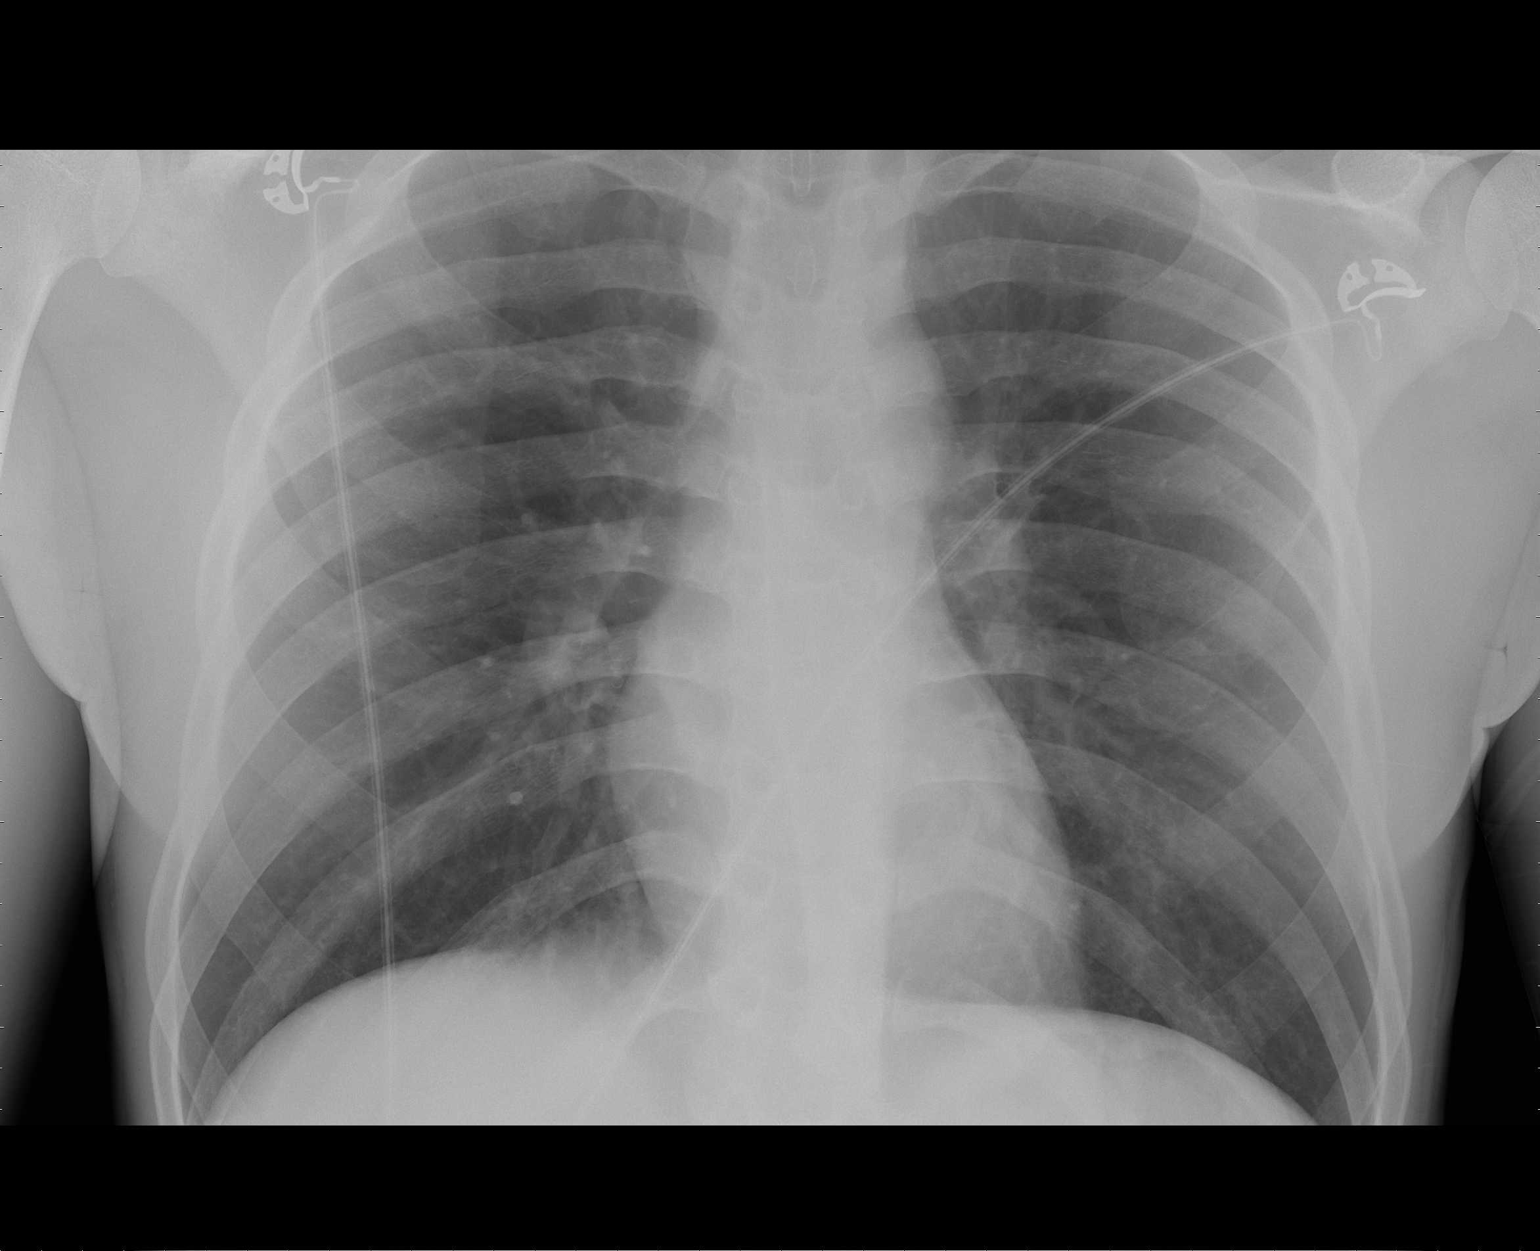

[1 of 1 positions shown; findings below may reference images not displayed]

FINDINGS: Heart size is normal.

No pleural effusions or pulmonary edema.

No airspace consolidation identified.
IMPRESSION: 1.  No active cardiopulmonary disease.

## 2012-05-07 ENCOUNTER — Telehealth: Payer: Self-pay | Admitting: Family Medicine

## 2012-05-07 NOTE — Telephone Encounter (Signed)
Caller: [name]/Patient; PCP: Nelwyn Salisbury.; CB#: [phone]; ; ; Call regarding Bilateral Leg/Feet Pain;  Jason [name] has been having bilateral leg/foot pain for 2 years.  States it has worsened.  Thinks he may have Diabetic Neuropathy.  Pain is unbearable at times.  C/o severe pain today with numbness/tingling in toes.  Utilized Leg Non -Injury Guideline.  ED disposition d/t "new onset of severe pain and change in sensation".  No appts available in EPIC.  PLEASE F/U WITH Shahzaib REGARDING APPT NEED.

## 2012-05-07 NOTE — Telephone Encounter (Signed)
I spoke with Jason [name] and advised him to make schedule a appointment with Dr. Talmage Nap, per Dr. Claris Che advise and Jason [name] agreed to follow up with [facility].

## 2012-05-12 ENCOUNTER — Emergency Department (HOSPITAL_BASED_OUTPATIENT_CLINIC_OR_DEPARTMENT_OTHER)
Admission: EM | Admit: 2012-05-12 | Discharge: 2012-05-12 | Disposition: A | Discharge status: Home / Self Care | Payer: Self-pay | Attending: Emergency Medicine | Admitting: Emergency Medicine

## 2012-05-12 ENCOUNTER — Encounter (HOSPITAL_BASED_OUTPATIENT_CLINIC_OR_DEPARTMENT_OTHER): Payer: Self-pay | Admitting: *Deleted

## 2012-05-12 DIAGNOSIS — J45909 Unspecified asthma, uncomplicated: Secondary | ICD-10-CM | POA: Insufficient documentation

## 2012-05-12 DIAGNOSIS — Z79899 Other long term (current) drug therapy: Secondary | ICD-10-CM | POA: Insufficient documentation

## 2012-05-12 DIAGNOSIS — K219 Gastro-esophageal reflux disease without esophagitis: Secondary | ICD-10-CM | POA: Insufficient documentation

## 2012-05-12 DIAGNOSIS — E119 Type 2 diabetes mellitus without complications: Secondary | ICD-10-CM | POA: Insufficient documentation

## 2012-05-12 DIAGNOSIS — Z8614 Personal history of Methicillin resistant Staphylococcus aureus infection: Secondary | ICD-10-CM | POA: Insufficient documentation

## 2012-05-12 DIAGNOSIS — H5710 Ocular pain, unspecified eye: Secondary | ICD-10-CM | POA: Insufficient documentation

## 2012-05-12 DIAGNOSIS — Z794 Long term (current) use of insulin: Secondary | ICD-10-CM | POA: Insufficient documentation

## 2012-05-12 DIAGNOSIS — H109 Unspecified conjunctivitis: Secondary | ICD-10-CM

## 2012-05-12 MED ORDER — FLUORESCEIN SODIUM 1 MG OP STRP
ORAL_STRIP | OPHTHALMIC | Status: AC
  Administered 2012-05-12: 1 via OPHTHALMIC
  Filled 2012-05-12: qty 1

## 2012-05-12 MED ORDER — BACITRACIN-POLYMYXIN B 500-10000 UNIT/GM OP OINT: TOPICAL_OINTMENT | Freq: Two times a day (BID) | OPHTHALMIC | Status: AC

## 2012-05-12 MED ORDER — TETRACAINE HCL 0.5 % OP SOLN
OPHTHALMIC | Status: AC
  Administered 2012-05-12: 18:00:00 via OPHTHALMIC
  Filled 2012-05-12: qty 2

## 2012-05-12 NOTE — ED Notes (Signed)
Pt. Wife out asking for Pt. To have oral hydration.  Explained to Wife we need to wait till EDP sees him.

## 2012-05-12 NOTE — ED Notes (Signed)
Right eye is tearing, painful and light sensitive. No known injury. Symptoms started 2 days ago.

## 2012-05-12 NOTE — ED Provider Notes (Signed)
History     CSN: [id number]  Arrival date & time 05/12/12  1541   First MD Initiated Contact with Patient 05/12/12 1656      Chief Complaint  Patient presents with  . Eye Pain    (Consider location/radiation/quality/duration/timing/severity/associated sxs/prior treatment) HPI Comments: Patient presents with a 2 day history of right eye pain. He reports a gradual onset of right eye pain that has progressively gotten worse since onset. He reports associated watery and purulent drainage, blurry vision, and photophobia. He reports a "heaviness" of his eyelid that is relieved when he closes his eye. He denies any trauma to the right eye and denies any possibility of foreign body. He has taken ibuprofen for the pain which has provided mild relief. He has never had this before.   Patient is a 26 y.o. male presenting with eye pain.  Eye Pain Pertinent negatives include no abdominal pain, chest pain, congestion, fatigue, fever, headaches, nausea or vomiting.    Past Medical History  Diagnosis Date  . Diabetes mellitus   . GERD (gastroesophageal reflux disease)   . Asthma   . Hx MRSA infection     on face    Past Surgical History  Procedure Date  . Tonsillectomy     Family History  Problem Relation Age of Onset  . Diabetes Father   . Asthma      fhx  . Hypertension      fhx  . Stroke      fhx    History  Substance Use Topics  . Smoking status: Current Some Day Smoker  . Smokeless tobacco: Not on file  . Alcohol Use: No      Review of Systems  Constitutional: Negative for fever and fatigue.  HENT: Negative for congestion and sinus pressure.   Eyes: Positive for photophobia, pain, discharge, redness and visual disturbance.  Respiratory: Negative for chest tightness and shortness of breath.   Cardiovascular: Negative for chest pain.  Gastrointestinal: Negative for nausea, vomiting, abdominal pain and diarrhea.  Skin: Negative for wound.  Neurological: Negative for  dizziness, light-headedness and headaches.    Allergies  Cefuroxime axetil and Penicillins  Home Medications   Current Outpatient Rx  Name Route Sig Dispense Refill  . DIPHENHYDRAMINE HCL 25 MG PO TABS Oral Take 25 mg by mouth every 6 (six) hours as needed. For allergies    . VICODIN PO Oral Take 1 tablet by mouth every 4 (four) hours as needed. For pain    . IBUPROFEN 800 MG PO TABS Oral Take 800 mg by mouth every 8 (eight) hours as needed. For pain    . INSULIN LISPRO (HUMAN) 100 UNIT/ML [place] SOLN Subcutaneous Inject 2-15 Units into the skin 3 (three) times daily. Sliding scale with every meal    . INSULIN LISPRO PROT & LISPRO (75-25) 100 UNIT/ML [place] SUSP Subcutaneous Inject 20 Units into the skin daily with supper.    Marland Kitchen METOCLOPRAMIDE HCL 10 MG PO TABS Oral Take 10 mg by mouth 4 (four) times daily as needed. To block bile duct    . OMEPRAZOLE-SODIUM BICARBONATE 20-1100 MG PO CAPS Oral Take 1 capsule by mouth daily.      BP 128/84  Pulse 101  Temp 98.2 F (36.8 C) (Oral)  Resp 20  SpO2 98%  Physical Exam  Nursing note and vitals reviewed. Constitutional: He appears well-developed and well-nourished.  HENT:  Head: Normocephalic and atraumatic.  Eyes:       Right conjunctival injection with  notable purulent drainage as well moderate watering. Left pupil is round and reactive with clear conjunctiva.   Neck: Normal range of motion.  Cardiovascular: Normal rate and regular rhythm.  Exam reveals no gallop and no friction rub.   No murmur heard. Pulmonary/Chest: Effort normal and breath sounds normal. He has no wheezes. He has no rales. He exhibits no tenderness.  Musculoskeletal: Normal range of motion.  Neurological: He is alert.  Skin: Skin is warm and dry. He is not diaphoretic.  Psychiatric: He has a normal mood and affect. His behavior is normal.    ED Course  Procedures (including critical care time)  Labs Reviewed - No data to display No results found.   No diagnosis  found.    MDM  6:21 PM Patient most likely has bacterial conjunctivitis. I will discharge him with antibiotic eye drops after visual acuity is assessed.  12:13 AM Patient most likely has bacterial conjunctivitis and will be discharged with antibiotic drops. He should return to the ED if symptoms worsen or loss of vision occurs. No further work up needed at this time. Plan discussed with Dr. Karma Ganja who is agreeable.       Emilia Beck, PA-C 05/13/12 [phone]

## 2012-05-12 NOTE — ED Notes (Signed)
Pt. Very sleepy in room and has to be awakened each time RN goes into room.

## 2012-05-13 NOTE — ED Provider Notes (Signed)
Medical screening examination/treatment/procedure(s) were performed by non-physician practitioner and as supervising physician I was immediately available for consultation/collaboration.  Ethelda Chick, MD 05/13/12 [phone]

## 2012-06-01 ENCOUNTER — Emergency Department (HOSPITAL_BASED_OUTPATIENT_CLINIC_OR_DEPARTMENT_OTHER)
Admission: EM | Admit: 2012-06-01 | Discharge: 2012-06-01 | Disposition: A | Discharge status: Home / Self Care | Payer: Self-pay | Attending: Emergency Medicine | Admitting: Emergency Medicine

## 2012-06-01 ENCOUNTER — Encounter (HOSPITAL_BASED_OUTPATIENT_CLINIC_OR_DEPARTMENT_OTHER): Payer: Self-pay | Admitting: Emergency Medicine

## 2012-06-01 DIAGNOSIS — Z91013 Allergy to seafood: Secondary | ICD-10-CM | POA: Insufficient documentation

## 2012-06-01 DIAGNOSIS — K029 Dental caries, unspecified: Secondary | ICD-10-CM | POA: Insufficient documentation

## 2012-06-01 DIAGNOSIS — J45909 Unspecified asthma, uncomplicated: Secondary | ICD-10-CM | POA: Insufficient documentation

## 2012-06-01 DIAGNOSIS — K0889 Other specified disorders of teeth and supporting structures: Secondary | ICD-10-CM

## 2012-06-01 DIAGNOSIS — K219 Gastro-esophageal reflux disease without esophagitis: Secondary | ICD-10-CM | POA: Insufficient documentation

## 2012-06-01 DIAGNOSIS — Z880 Allergy status to penicillin: Secondary | ICD-10-CM | POA: Insufficient documentation

## 2012-06-01 DIAGNOSIS — Z8614 Personal history of Methicillin resistant Staphylococcus aureus infection: Secondary | ICD-10-CM | POA: Insufficient documentation

## 2012-06-01 DIAGNOSIS — F17200 Nicotine dependence, unspecified, uncomplicated: Secondary | ICD-10-CM | POA: Insufficient documentation

## 2012-06-01 DIAGNOSIS — E119 Type 2 diabetes mellitus without complications: Secondary | ICD-10-CM | POA: Insufficient documentation

## 2012-06-01 MED ORDER — CLINDAMYCIN HCL 150 MG PO CAPS: 150.0000 mg | ORAL_CAPSULE | Freq: Four times a day (QID) | ORAL | Status: AC

## 2012-06-01 MED ORDER — HYDROCODONE-ACETAMINOPHEN 5-325 MG PO TABS: ORAL_TABLET | ORAL | Status: AC

## 2012-06-01 MED ORDER — HYDROCODONE-ACETAMINOPHEN 5-325 MG PO TABS
1.0000 | ORAL_TABLET | Freq: Once | ORAL | Status: AC
  Administered 2012-06-01: 1 via ORAL
  Filled 2012-06-01: qty 1

## 2012-06-01 NOTE — Discharge Instructions (Signed)
Dental Pain A tooth ache may be caused by cavities (tooth decay). Cavities expose the nerve of the tooth to air and hot or cold temperatures. It may come from an infection or abscess (also called a boil or furuncle) around your tooth. It is also often caused by dental caries (tooth decay). This causes the pain you are having. DIAGNOSIS  Your caregiver can diagnose this problem by exam. TREATMENT   If caused by an infection, it may be treated with medications which kill germs (antibiotics) and pain medications as prescribed by your caregiver. Take medications as directed.   Only take over-the-counter or prescription medicines for pain, discomfort, or fever as directed by your caregiver.   Whether the tooth ache today is caused by infection or dental disease, you should see your dentist as soon as possible for further care.  SEEK MEDICAL CARE IF: The exam and treatment you received today has been provided on an emergency basis only. This is not a substitute for complete medical or dental care. If your problem worsens or new problems (symptoms) appear, and you are unable to meet with your dentist, call or return to this location. SEEK IMMEDIATE MEDICAL CARE IF:   You have a fever.   You develop redness and swelling of your face, jaw, or neck.   You are unable to open your mouth.   You have severe pain uncontrolled by pain medicine.  MAKE SURE YOU:   Understand these instructions.   Will watch your condition.   Will get help right away if you are not doing well or get worse.  Document Released: 09/24/2005 Document Revised: 09/13/2011 Document Reviewed: 05/12/2008 [employer] Patient Information 2012 [employer].    Narcotic and benzodiazepine use may cause drowsiness, slowed breathing or dependence.  Please use with caution and do not drive, operate machinery or watch young children alone while taking them.  Taking combinations of these medications or drinking alcohol will potentiate  these effects.    

## 2012-06-01 NOTE — ED Provider Notes (Signed)
History     CSN: [id number]  Arrival date & time 06/01/12  4098   First MD Initiated Contact with Patient 06/01/12 [phone]      Chief Complaint  Patient presents with  . Dental Pain    (Consider location/radiation/quality/duration/timing/severity/associated sxs/prior treatment) Patient is a 26 y.o. male presenting with tooth pain. The history is provided by the patient.  Dental PainPrimary symptoms do not include fever, shortness of breath, sore throat or cough. The symptoms began 2 days ago. The symptoms are worsening. The symptoms occur constantly.  Additional symptoms include: gum swelling and facial swelling. Additional symptoms do not include: dental sensitivity to temperature, purulent gums, trismus, jaw pain, trouble swallowing, pain with swallowing, drooling and ear pain. Medical issues include: smoking and periodontal disease.    Past Medical History  Diagnosis Date  . Diabetes mellitus   . GERD (gastroesophageal reflux disease)   . Asthma   . Hx MRSA infection     on face    Past Surgical History  Procedure Date  . Tonsillectomy     Family History  Problem Relation Age of Onset  . Diabetes Father   . Asthma      fhx  . Hypertension      fhx  . Stroke      fhx    History  Substance Use Topics  . Smoking status: Current Everyday Smoker  . Smokeless tobacco: Not on file  . Alcohol Use: No      Review of Systems  Constitutional: Negative for fever and chills.  HENT: Positive for facial swelling. Negative for ear pain, sore throat, drooling, mouth sores, trouble swallowing and neck pain.   Respiratory: Negative for cough and shortness of breath.   Gastrointestinal: Negative for nausea and vomiting.  Skin: Negative for rash.    Allergies  Cefuroxime axetil; Penicillins; Tessalon; and Shellfish allergy  Home Medications   Current Outpatient Rx  Name Route Sig Dispense Refill  . ALBUTEROL SULFATE HFA 108 (90 BASE) MCG/ACT IN AERS Inhalation Inhale 2  puffs into the lungs every 6 (six) hours as needed. For shortness of breath    . CLINDAMYCIN HCL 150 MG PO CAPS Oral Take 1 capsule (150 mg total) by mouth every 6 (six) hours. 28 capsule 0  . FLUNISOLIDE 250 MCG/ACT IN AERS Inhalation Inhale 2 puffs into the lungs 2 (two) times daily as needed. For shortness of breath    . HYDROCODONE-ACETAMINOPHEN 5-325 MG PO TABS  1-2 tablets po q 6 hours prn moderate to severe pain 20 tablet 0  . HYDROCODONE-ACETAMINOPHEN 7.5-750 MG PO TABS Oral Take 1 tablet by mouth every 6 (six) hours as needed. For pain    . IBUPROFEN 200 MG PO CAPS Oral Take 2 capsules by mouth once as needed. For headache    . IBUPROFEN 800 MG PO TABS Oral Take 800 mg by mouth every 8 (eight) hours as needed. For pain    . INSULIN LISPRO (HUMAN) 100 UNIT/ML [place] SOLN Subcutaneous Inject 2-15 Units into the skin 3 (three) times daily. Sliding scale with every meal    . INSULIN LISPRO PROT & LISPRO (75-25) 100 UNIT/ML [place] SUSP Subcutaneous Inject 20 Units into the skin daily with supper.    Marland Kitchen METOCLOPRAMIDE HCL 10 MG PO TABS Oral Take 10 mg by mouth 4 (four) times daily as needed. To block bile duct    . OMEPRAZOLE-SODIUM BICARBONATE 20-1100 MG PO CAPS Oral Take 1 capsule by mouth daily.  BP 137/97  Pulse 101  Resp 16  SpO2 100%  Physical Exam  Nursing note and vitals reviewed. Constitutional: He appears well-developed and well-nourished.  HENT:  Head: Normocephalic and atraumatic.    Mouth/Throat: Uvula is midline and mucous membranes are normal.    Neck: Normal range of motion and phonation normal. Neck supple.  Pulmonary/Chest: Effort normal. No stridor. No respiratory distress.  Abdominal: Soft.  Lymphadenopathy:       Head (right side): No submandibular adenopathy present.       Head (left side): No submandibular adenopathy present.    He has no cervical adenopathy.  Skin: Skin is warm and dry. No rash noted.    ED Course  Procedures (including critical care  time)  Labs Reviewed - No data to display No results found.   1. Pain, dental       MDM  Dental pain, caries noted, will put on clindamycin, analgesics.  Also recommended lemon candies as parotid gland could be involved, although more likely it is related to dental pain and infection, possibly early abscess.  Referred to adult dentistry Dr. Renard Hamper and told to keep paperwork and call for appt within 48 hours.          Gavin Pound. Oletta Lamas, MD 06/01/12 1610

## 2012-06-01 NOTE — ED Notes (Signed)
Pt c/o pain to LT upper & lower dental pain

## 2012-06-30 ENCOUNTER — Encounter (HOSPITAL_BASED_OUTPATIENT_CLINIC_OR_DEPARTMENT_OTHER): Payer: Self-pay | Admitting: *Deleted

## 2012-06-30 ENCOUNTER — Emergency Department (HOSPITAL_BASED_OUTPATIENT_CLINIC_OR_DEPARTMENT_OTHER)
Admission: EM | Admit: 2012-06-30 | Discharge: 2012-07-01 | Disposition: A | Discharge status: Home / Self Care | Payer: Self-pay | Attending: Emergency Medicine | Admitting: Emergency Medicine

## 2012-06-30 DIAGNOSIS — Z79899 Other long term (current) drug therapy: Secondary | ICD-10-CM | POA: Insufficient documentation

## 2012-06-30 DIAGNOSIS — Z794 Long term (current) use of insulin: Secondary | ICD-10-CM | POA: Insufficient documentation

## 2012-06-30 DIAGNOSIS — J45909 Unspecified asthma, uncomplicated: Secondary | ICD-10-CM | POA: Insufficient documentation

## 2012-06-30 DIAGNOSIS — F17200 Nicotine dependence, unspecified, uncomplicated: Secondary | ICD-10-CM | POA: Insufficient documentation

## 2012-06-30 DIAGNOSIS — E119 Type 2 diabetes mellitus without complications: Secondary | ICD-10-CM | POA: Insufficient documentation

## 2012-06-30 DIAGNOSIS — H00019 Hordeolum externum unspecified eye, unspecified eyelid: Secondary | ICD-10-CM

## 2012-06-30 DIAGNOSIS — R739 Hyperglycemia, unspecified: Secondary | ICD-10-CM

## 2012-06-30 DIAGNOSIS — Z8614 Personal history of Methicillin resistant Staphylococcus aureus infection: Secondary | ICD-10-CM | POA: Insufficient documentation

## 2012-06-30 DIAGNOSIS — K219 Gastro-esophageal reflux disease without esophagitis: Secondary | ICD-10-CM | POA: Insufficient documentation

## 2012-06-30 LAB — BASIC METABOLIC PANEL
CO2: 23 mEq/L (ref 19–32)
GFR calc non Af Amer: >90 mL/min (ref >90)
Glucose, Bld: 326 mg/dL — ABNORMAL HIGH (ref 70–99)
Potassium: 2.9 mEq/L — ABNORMAL LOW (ref 3.5–5.1)
Sodium: 137 mEq/L (ref 135–145)

## 2012-06-30 LAB — CBC WITH DIFFERENTIAL/PLATELET
Lymphocytes Relative: 36 % (ref 12–46)
Lymphs Abs: 3.2 10*3/uL (ref 0.7–4.0)
Neutrophils Relative %: 48 % (ref 43–77)
Platelets: 351 10*3/uL (ref 150–400)
RBC: 4.27 MIL/uL (ref 4.22–5.81)
WBC: 8.9 10*3/uL (ref 4.0–10.5)

## 2012-06-30 LAB — URINALYSIS, ROUTINE W REFLEX MICROSCOPIC
Leukocytes, UA: NEGATIVE
Nitrite: NEGATIVE
Protein, ur: 30 mg/dL — AB
Urobilinogen, UA: 0.2 mg/dL (ref 0.0–1.0)

## 2012-06-30 LAB — URINE MICROSCOPIC-ADD ON

## 2012-06-30 LAB — GLUCOSE, CAPILLARY: Glucose-Capillary: 311 mg/dL — ABNORMAL HIGH (ref 70–99)

## 2012-06-30 MED ORDER — SODIUM CHLORIDE 0.9 % IV BOLUS (SEPSIS)
1000.0000 mL | Freq: Once | INTRAVENOUS | Status: AC
  Administered 2012-06-30: 1000 mL via INTRAVENOUS

## 2012-06-30 MED ORDER — POTASSIUM CHLORIDE CRYS ER 20 MEQ PO TBCR
40.0000 meq | EXTENDED_RELEASE_TABLET | Freq: Once | ORAL | Status: AC
  Administered 2012-06-30: 40 meq via ORAL
  Filled 2012-06-30: qty 2

## 2012-06-30 MED ORDER — ONDANSETRON HCL 4 MG/2ML IJ SOLN
4.0000 mg | Freq: Once | INTRAMUSCULAR | Status: AC
  Administered 2012-06-30: 4 mg via INTRAVENOUS
  Filled 2012-06-30: qty 2

## 2012-06-30 NOTE — ED Provider Notes (Signed)
History   This chart was scribed for Jason Consalvo Smitty Cords, MD by Jason [name]. The patient was seen in room MH04/MH04 and the patient's care was started at 10:13PM    CSN: [id number]  Arrival date & time 06/30/12  2123   None     Chief Complaint  Patient presents with  . Hyperglycemia    (Consider location/radiation/quality/duration/timing/severity/associated sxs/prior treatment) Patient is a 26 y.o. male presenting with musculoskeletal pain and eye problem. The history is provided by the patient. No language interpreter was used.  Muscle Pain This is a new problem. The current episode started more than 1 week ago (more than a year ago). The problem occurs constantly. The problem has not changed since onset.Pertinent negatives include no chest pain, no abdominal pain, no headaches and no shortness of breath. Exacerbated by: elevated blood sugar. Nothing relieves the symptoms. He has tried nothing for the symptoms. The treatment provided no relief.  Eye Problem  This is a new problem. The current episode started more than 1 week ago. The problem occurs constantly. The problem has not changed since onset.There is pain in the left eye. There was no injury mechanism. The pain is at a severity of 2/10. The pain is mild. There is no history of trauma to the eye. There is no known exposure to pink eye. He does not wear contacts. Pertinent negatives include no numbness, no blurred vision, no decreased vision, no discharge, no foreign body sensation and no photophobia.  Swelling focally of the left lower eyelid  Jason [name] is a 26 y.o. male , with a hx of diabetes mellitus , who presents to the Emergency Department complaining of gradual, progressively worsening, hyperglycemia, onset one year ago with associated symptoms of nausea. The pt reports he has been non compliant taking his insulin for over a year. Modifying factors include taking 12 units of blood sugar and drinking water which does not  provide relief of the hyperglycemia. The pt has a hx of GERD and asthma. No CP sob.  No f/c/r.  No focal weakness or numbness. No headaches   The pt is a current everyday smoker, however, he does not drink alcohol.   PCP is Jason [name].    Past Medical History  Diagnosis Date  . Diabetes mellitus   . GERD (gastroesophageal reflux disease)   . Asthma   . Hx MRSA infection     on face    Past Surgical History  Procedure Date  . Tonsillectomy     Family History  Problem Relation Age of Onset  . Diabetes Father   . Asthma      fhx  . Hypertension      fhx  . Stroke      fhx    History  Substance Use Topics  . Smoking status: Current Every Day Smoker  . Smokeless tobacco: Not on file  . Alcohol Use: No      Review of Systems  Constitutional: Negative for chills.  HENT: Negative for neck pain.   Eyes: Negative for blurred vision, photophobia and discharge.  Respiratory: Negative for chest tightness and shortness of breath.   Cardiovascular: Negative for chest pain and leg swelling.  Gastrointestinal: Negative for abdominal pain.  Neurological: Negative for light-headedness, numbness and headaches.  All other systems reviewed and are negative.    Allergies  Cefuroxime axetil; Penicillins; Tessalon; and Shellfish allergy  Home Medications   Current Outpatient Rx  Name Route Sig Dispense Refill  . ALBUTEROL  SULFATE HFA 108 (90 BASE) MCG/ACT IN AERS Inhalation Inhale 2 puffs into the lungs every 6 (six) hours as needed. For shortness of breath    . FLUNISOLIDE 250 MCG/ACT IN AERS Inhalation Inhale 2 puffs into the lungs 2 (two) times daily as needed. For shortness of breath    . HYDROCODONE-ACETAMINOPHEN 7.5-750 MG PO TABS Oral Take 1 tablet by mouth every 6 (six) hours as needed. For pain    . IBUPROFEN 200 MG PO CAPS Oral Take 2 capsules by mouth once as needed. For headache    . IBUPROFEN 800 MG PO TABS Oral Take 800 mg by mouth every 8 (eight) hours as needed.  For pain    . INSULIN LISPRO (HUMAN) 100 UNIT/ML [place] SOLN Subcutaneous Inject 2-15 Units into the skin 3 (three) times daily. Sliding scale with every meal    . INSULIN LISPRO PROT & LISPRO (75-25) 100 UNIT/ML [name] SUSP Subcutaneous Inject 20 Units into the skin daily with supper.    . INSULIN ISOPHANE & REGULAR (70-30) 100 UNIT/ML [place] SUSP Subcutaneous Inject 20 Units into the skin at bedtime.    Marland Kitchen METOCLOPRAMIDE HCL 10 MG PO TABS Oral Take 10 mg by mouth 4 (four) times daily as needed. To block bile duct    . OMEPRAZOLE-SODIUM BICARBONATE 20-1100 MG PO CAPS Oral Take 1 capsule by mouth daily.      BP 141/86  Pulse 105  Temp 98.3 F (36.8 C) (Oral)  Resp 20  SpO2 100%  Physical Exam  Nursing note and vitals reviewed. Constitutional: He is oriented to person, place, and time. He appears well-developed and well-nourished.  HENT:  Head: Normocephalic and atraumatic.  Right Ear: Tympanic membrane and external ear normal.  Left Ear: Tympanic membrane and external ear normal.  Nose: Nose normal.  Mouth/Throat: Oropharynx is clear and moist.  Eyes: Conjunctivae normal and EOM are normal. Pupils are equal, round, and reactive to light. Left eye exhibits no chemosis, no discharge and no exudate. No foreign body present in the left eye. Left conjunctiva is not injected.  Slit lamp exam:      The left eye shows no corneal abrasion.    Neck: Normal range of motion. Neck supple.  Cardiovascular: Normal rate, regular rhythm and normal heart sounds.   Pulmonary/Chest: Effort normal and breath sounds normal.  Abdominal: Soft. Bowel sounds are normal. There is no tenderness.  Musculoskeletal: Normal range of motion.  Neurological: He is alert and oriented to person, place, and time.  Skin: Skin is warm and dry.  Psychiatric: He has a normal mood and affect. His behavior is normal.    ED Course  Procedures (including critical care time)  DIAGNOSTIC STUDIES: Oxygen Saturation is 100% on room  air, normal by my interpretation.    COORDINATION OF CARE:    10:14PM- Pain management and treatment plan discussed with patient. Pt agrees to treatment.   Results for orders placed during the hospital encounter of 06/30/12  URINALYSIS, ROUTINE W REFLEX MICROSCOPIC      Component Value Range   Color, Urine YELLOW  YELLOW   APPearance CLEAR  CLEAR   Specific Gravity, Urine 1.037 (*) 1.005 - 1.030   pH 6.0  5.0 - 8.0   Glucose, UA >1000 (*) NEGATIVE mg/dL   Hgb urine dipstick MODERATE (*) NEGATIVE   Bilirubin Urine NEGATIVE  NEGATIVE   Ketones, ur 15 (*) NEGATIVE mg/dL   Protein, ur 30 (*) NEGATIVE mg/dL   Urobilinogen, UA 0.2  0.0 -  1.0 mg/dL   Nitrite NEGATIVE  NEGATIVE   Leukocytes, UA NEGATIVE  NEGATIVE  GLUCOSE, CAPILLARY      Component Value Range   Glucose-Capillary 311 (*) 70 - 99 mg/dL  CBC WITH DIFFERENTIAL      Component Value Range   WBC 8.9  4.0 - 10.5 K/uL   RBC 4.27  4.22 - 5.81 MIL/uL   Hemoglobin 11.9 (*) 13.0 - 17.0 g/dL   HCT 16.1 (*) 09.6 - 04.5 %   MCV 78.5  78.0 - 100.0 fL   MCH 27.9  26.0 - 34.0 pg   MCHC 35.5  30.0 - 36.0 g/dL   RDW 40.9  81.1 - 91.4 %   Platelets 351  150 - 400 K/uL   Neutrophils Relative 48  43 - 77 %   Neutro Abs 4.3  1.7 - 7.7 K/uL   Lymphocytes Relative 36  12 - 46 %   Lymphs Abs 3.2  0.7 - 4.0 K/uL   Monocytes Relative 11  3 - 12 %   Monocytes Absolute 1.0  0.1 - 1.0 K/uL   Eosinophils Relative 4  0 - 5 %   Eosinophils Absolute 0.3  0.0 - 0.7 K/uL   Basophils Relative 1  0 - 1 %   Basophils Absolute 0.1  0.0 - 0.1 K/uL  BASIC METABOLIC PANEL      Component Value Range   Sodium 137  135 - 145 mEq/L   Potassium 2.9 (*) 3.5 - 5.1 mEq/L   Chloride 96  96 - 112 mEq/L   CO2 23  19 - 32 mEq/L   Glucose, Bld 326 (*) 70 - 99 mg/dL   BUN 4 (*) 6 - 23 mg/dL   Creatinine, Ser 7.82  0.50 - 1.35 mg/dL   Calcium 9.2  8.4 - 95.6 mg/dL   GFR calc non Af Amer >90  >90 mL/min   GFR calc Af Amer >90  >90 mL/min  URINE MICROSCOPIC-ADD  ON      Component Value Range   Squamous Epithelial / LPF RARE  RARE   WBC, UA 0-2  <3 WBC/hpf   RBC / HPF 3-6  <3 RBC/hpf  GLUCOSE, CAPILLARY      Component Value Range   Glucose-Capillary 216 (*) 70 - 99 mg/dL   Comment 1 Notify RN     Comment 2 Documented in Chart      No results found.   No diagnosis found.    MDM  Given stye care instructions.  Patient and wife verbalize understanding.  Corrected anion gap with fluids alone.  Began insulin.  As patient states he lives with a blood sugar that is "high" will not try to bring sugar into normal range.  Gap corrected and potassium replenished.  Tolerating POs.  Follow up in the office in the am for medication adjustment and HbA1c check.  Patient admitted he is eating and keeping down pos at home.  Patient and wife verbalize understanding and agree to follow up.  Return for polyuria polydipsia vomiting or any concerns.        I personally performed the services described in this documentation, which was scribed in my presence. The recorded information has been reviewed and considered.     Jasmine Awe, MD 07/01/12 [phone]

## 2012-06-30 NOTE — ED Notes (Signed)
Sates his glucose is elevated. Has not checked his blood sugar. Non compliant with taking his Insulin.

## 2012-07-01 LAB — GLUCOSE, CAPILLARY
Glucose-Capillary: 341 mg/dL — ABNORMAL HIGH (ref 70–99)
Glucose-Capillary: 328 mg/dL — ABNORMAL HIGH (ref 70–99)
Glucose-Capillary: 356 mg/dL — ABNORMAL HIGH (ref 70–99)

## 2012-07-01 LAB — BASIC METABOLIC PANEL
BUN: 3 mg/dL — ABNORMAL LOW (ref 6–23)
CO2: 22 mEq/L (ref 19–32)
Calcium: 7.9 mg/dL — ABNORMAL LOW (ref 8.4–10.5)
Creatinine, Ser: 0.7 mg/dL (ref 0.50–1.35)

## 2012-07-01 MED ORDER — INSULIN REGULAR HUMAN 100 UNIT/ML IJ SOLN
6.0000 [IU] | Freq: Once | INTRAMUSCULAR | Status: AC
  Administered 2012-07-01: 6 [IU] via INTRAVENOUS

## 2012-07-01 MED ORDER — INSULIN REGULAR HUMAN 100 UNIT/ML IJ SOLN
3.0000 [IU] | Freq: Once | INTRAMUSCULAR | Status: AC
  Administered 2012-07-01: 3 [IU] via INTRAVENOUS

## 2012-07-01 MED ORDER — FLUORESCEIN SODIUM 1 MG OP STRP
1.0000 | ORAL_STRIP | Freq: Once | OPHTHALMIC | Status: AC
  Administered 2012-07-01: 1 via OPHTHALMIC

## 2012-07-01 MED ORDER — TETRACAINE HCL 0.5 % OP SOLN
2.0000 [drp] | Freq: Once | OPHTHALMIC | Status: AC
  Administered 2012-07-01: 2 [drp] via OPHTHALMIC

## 2012-07-01 MED ORDER — INSULIN REGULAR HUMAN 100 UNIT/ML IJ SOLN
INTRAMUSCULAR | Status: AC
  Administered 2012-07-01: 4 [IU] via SUBCUTANEOUS
  Filled 2012-07-01: qty 4

## 2012-07-01 MED ORDER — POTASSIUM CHLORIDE CRYS ER 20 MEQ PO TBCR
20.0000 meq | EXTENDED_RELEASE_TABLET | Freq: Once | ORAL | Status: AC
  Administered 2012-07-01: 20 meq via ORAL
  Filled 2012-07-01: qty 1

## 2012-07-01 MED ORDER — TETRACAINE HCL 0.5 % OP SOLN
OPHTHALMIC | Status: AC
  Administered 2012-07-01: 2 [drp] via OPHTHALMIC
  Filled 2012-07-01: qty 2

## 2012-07-01 MED ORDER — INSULIN REGULAR HUMAN 100 UNIT/ML IJ SOLN
4.0000 [IU] | Freq: Once | INTRAMUSCULAR | Status: AC
  Administered 2012-07-01: 4 [IU] via SUBCUTANEOUS

## 2012-07-01 MED ORDER — SODIUM CHLORIDE 0.9 % IV BOLUS (SEPSIS)
1000.0000 mL | Freq: Once | INTRAVENOUS | Status: AC
  Administered 2012-07-01: 1000 mL via INTRAVENOUS

## 2012-07-01 MED ORDER — SODIUM CHLORIDE 0.9 % IV BOLUS (SEPSIS)
500.0000 mL | Freq: Once | INTRAVENOUS | Status: AC
  Administered 2012-07-01: 500 mL via INTRAVENOUS

## 2012-07-01 MED ORDER — FLUORESCEIN SODIUM 1 MG OP STRP
ORAL_STRIP | OPHTHALMIC | Status: AC
  Administered 2012-07-01: 1 via OPHTHALMIC
  Filled 2012-07-01: qty 1

## 2012-07-01 MED ORDER — INSULIN REGULAR HUMAN 100 UNIT/ML IJ SOLN
INTRAMUSCULAR | Status: AC
  Filled 2012-07-01: qty 6

## 2012-07-01 MED ORDER — INSULIN REGULAR HUMAN 100 UNIT/ML IJ SOLN
INTRAMUSCULAR | Status: AC
  Administered 2012-07-01: 3 [IU] via INTRAVENOUS
  Filled 2012-07-01: qty 3

## 2012-07-01 NOTE — ED Notes (Signed)
Took patient's blood sugar, result was: 356. Nurse notified.

## 2012-07-01 NOTE — ED Notes (Signed)
Pt 's family member sts pt had a little to eat before coming to ER ,pt forgot to informed MD

## 2012-07-01 NOTE — ED Notes (Signed)
Took patient's blood sugar result was: 349. Nurse notified. (Taken with different machine.)

## 2012-07-01 NOTE — ED Notes (Signed)
Took patient's blood sugar result was: 341. Nurse notified.

## 2012-08-26 ENCOUNTER — Telehealth: Payer: Self-pay | Admitting: Family Medicine

## 2012-08-26 NOTE — Telephone Encounter (Signed)
Pt is requesting samples of novolog and lantus

## 2012-08-27 NOTE — Telephone Encounter (Signed)
Should pt be coming in for a check up? Also there is different types of insulin in the chart and I don't know what pt should be taking?

## 2012-08-27 NOTE — Telephone Encounter (Signed)
NO, he sees Dr. Talmage Nap for his diabetes, not [name]

## 2012-08-27 NOTE — Telephone Encounter (Signed)
I spoke with pt and advised that he contact Dr. Willeen Cass office.

## 2012-10-02 ENCOUNTER — Emergency Department (HOSPITAL_BASED_OUTPATIENT_CLINIC_OR_DEPARTMENT_OTHER)
Admission: EM | Admit: 2012-10-02 | Discharge: 2012-10-02 | Disposition: A | Discharge status: Home / Self Care | Payer: Self-pay | Attending: Emergency Medicine | Admitting: Emergency Medicine

## 2012-10-02 ENCOUNTER — Encounter (HOSPITAL_BASED_OUTPATIENT_CLINIC_OR_DEPARTMENT_OTHER): Payer: Self-pay | Admitting: Emergency Medicine

## 2012-10-02 DIAGNOSIS — Z8614 Personal history of Methicillin resistant Staphylococcus aureus infection: Secondary | ICD-10-CM | POA: Insufficient documentation

## 2012-10-02 DIAGNOSIS — R112 Nausea with vomiting, unspecified: Secondary | ICD-10-CM | POA: Insufficient documentation

## 2012-10-02 DIAGNOSIS — Z79899 Other long term (current) drug therapy: Secondary | ICD-10-CM | POA: Insufficient documentation

## 2012-10-02 DIAGNOSIS — J45909 Unspecified asthma, uncomplicated: Secondary | ICD-10-CM | POA: Insufficient documentation

## 2012-10-02 DIAGNOSIS — F17200 Nicotine dependence, unspecified, uncomplicated: Secondary | ICD-10-CM | POA: Insufficient documentation

## 2012-10-02 DIAGNOSIS — Z794 Long term (current) use of insulin: Secondary | ICD-10-CM | POA: Insufficient documentation

## 2012-10-02 DIAGNOSIS — E119 Type 2 diabetes mellitus without complications: Secondary | ICD-10-CM | POA: Insufficient documentation

## 2012-10-02 DIAGNOSIS — K219 Gastro-esophageal reflux disease without esophagitis: Secondary | ICD-10-CM | POA: Insufficient documentation

## 2012-10-02 DIAGNOSIS — R197 Diarrhea, unspecified: Secondary | ICD-10-CM | POA: Insufficient documentation

## 2012-10-02 LAB — COMPREHENSIVE METABOLIC PANEL
ALT: 11 U/L (ref 0–53)
AST: 11 U/L (ref 0–37)
Albumin: 3.7 g/dL (ref 3.5–5.2)
Alkaline Phosphatase: 106 U/L (ref 39–117)
GFR calc Af Amer: >90 mL/min (ref >90)
Glucose, Bld: 590 mg/dL (ref 70–99)
Potassium: 3.8 mEq/L (ref 3.5–5.1)
Sodium: 132 mEq/L — ABNORMAL LOW (ref 135–145)
Total Protein: 7.7 g/dL (ref 6.0–8.3)

## 2012-10-02 LAB — CBC WITH DIFFERENTIAL/PLATELET
Lymphocytes Relative: 39 % (ref 12–46)
Lymphs Abs: 2.6 10*3/uL (ref 0.7–4.0)
Neutro Abs: 3 10*3/uL (ref 1.7–7.7)
Neutrophils Relative %: 45 % (ref 43–77)
Platelets: 348 10*3/uL (ref 150–400)
RBC: 4.53 MIL/uL (ref 4.22–5.81)

## 2012-10-02 LAB — BASIC METABOLIC PANEL
CO2: 29 mEq/L (ref 19–32)
Chloride: 99 mEq/L (ref 96–112)
Glucose, Bld: 288 mg/dL — ABNORMAL HIGH (ref 70–99)
Sodium: 138 mEq/L (ref 135–145)

## 2012-10-02 LAB — URINALYSIS, ROUTINE W REFLEX MICROSCOPIC
Glucose, UA: >1000 mg/dL — AB
Leukocytes, UA: NEGATIVE
Protein, ur: NEGATIVE mg/dL
Specific Gravity, Urine: 1.037 — ABNORMAL HIGH (ref 1.005–1.030)
pH: 6.5 (ref 5.0–8.0)

## 2012-10-02 LAB — POCT I-STAT 3, ART BLOOD GAS (G3+)
Acid-Base Excess: 4 mmol/L — ABNORMAL HIGH (ref 0.0–2.0)
O2 Saturation: 79 %
Patient temperature: 98.9

## 2012-10-02 MED ORDER — SODIUM CHLORIDE 0.9 % IV SOLN
INTRAVENOUS | Status: DC
  Administered 2012-10-02: 03:00:00 via INTRAVENOUS

## 2012-10-02 MED ORDER — POTASSIUM CHLORIDE CRYS ER 20 MEQ PO TBCR
40.0000 meq | EXTENDED_RELEASE_TABLET | Freq: Once | ORAL | Status: AC
  Administered 2012-10-02: 40 meq via ORAL
  Filled 2012-10-02: qty 2

## 2012-10-02 MED ORDER — DEXTROSE 50 % IV SOLN: 25.0000 mL | INTRAVENOUS | Status: DC | PRN

## 2012-10-02 MED ORDER — INSULIN REGULAR BOLUS VIA INFUSION
0.0000 [IU] | Freq: Three times a day (TID) | INTRAVENOUS | Status: DC
  Filled 2012-10-02: qty 10

## 2012-10-02 MED ORDER — INSULIN REGULAR HUMAN 100 UNIT/ML IJ SOLN
INTRAMUSCULAR | Status: AC
  Filled 2012-10-02: qty 1

## 2012-10-02 MED ORDER — SODIUM CHLORIDE 0.9 % IV BOLUS (SEPSIS)
2000.0000 mL | Freq: Once | INTRAVENOUS | Status: AC
  Administered 2012-10-02 (×2): 1000 mL via INTRAVENOUS

## 2012-10-02 MED ORDER — ONDANSETRON HCL 4 MG/2ML IJ SOLN
4.0000 mg | Freq: Once | INTRAMUSCULAR | Status: AC
  Administered 2012-10-02: 4 mg via INTRAVENOUS
  Filled 2012-10-02: qty 2

## 2012-10-02 MED ORDER — SODIUM CHLORIDE 0.9 % IV SOLN
INTRAVENOUS | Status: DC
  Administered 2012-10-02: 3.4 [IU]/h via INTRAVENOUS

## 2012-10-02 MED ORDER — DEXTROSE-NACL 5-0.45 % IV SOLN: INTRAVENOUS | Status: DC

## 2012-10-02 MED ORDER — ONDANSETRON HCL 4 MG PO TABS: 8.0000 mg | ORAL_TABLET | Freq: Four times a day (QID) | ORAL | Status: DC

## 2012-10-02 MED ORDER — HYDROMORPHONE HCL PF 1 MG/ML IJ SOLN
1.0000 mg | Freq: Once | INTRAMUSCULAR | Status: AC
  Administered 2012-10-02: 1 mg via INTRAVENOUS
  Filled 2012-10-02: qty 1

## 2012-10-02 NOTE — ED Provider Notes (Signed)
History     CSN: [id number]  Arrival date & time 10/02/12  0036   First MD Initiated Contact with Patient 10/02/12 0046      Chief Complaint  Patient presents with  . Emesis  . Diarrhea    (Consider location/radiation/quality/duration/timing/severity/associated sxs/prior treatment) HPI Complaint of nausea vomiting and diarrhea and diffuse crampy abdominal pain onset 3 days ago. Pain is constant treated with TUMS with transient relief. No known fever no hematemesis no blood per rectum. Associated symptoms include generalized weakness and feeling of dehydration. Nothing makes symptoms better or worse Past Medical History  Diagnosis Date  . Diabetes mellitus   . GERD (gastroesophageal reflux disease)   . Asthma   . Hx MRSA infection     on face    Past Surgical History  Procedure Date  . Tonsillectomy     Family History  Problem Relation Age of Onset  . Diabetes Father   . Asthma      fhx  . Hypertension      fhx  . Stroke      fhx    History  Substance Use Topics  . Smoking status: Current Every Day Smoker  . Smokeless tobacco: Not on file  . Alcohol Use: No      Review of Systems  Constitutional: Positive for unexpected weight change.       Weight loss over past 6 months  HENT: Negative.   Respiratory: Negative.   Cardiovascular: Negative.   Gastrointestinal: Positive for nausea, vomiting, abdominal pain and diarrhea.  Musculoskeletal: Negative.   Skin: Negative.   Neurological: Positive for weakness.       Generalized weakness  Hematological: Negative.   Psychiatric/Behavioral: Negative.   All other systems reviewed and are negative.    Allergies  Cefuroxime axetil; Penicillins; Tessalon; and Shellfish allergy  Home Medications   Current Outpatient Rx  Name  Route  Sig  Dispense  Refill  . ALBUTEROL SULFATE HFA 108 (90 BASE) MCG/ACT IN AERS   Inhalation   Inhale 2 puffs into the lungs every 6 (six) hours as needed. For shortness of  breath         . HYDROCODONE-ACETAMINOPHEN 7.5-750 MG PO TABS   Oral   Take 1 tablet by mouth every 6 (six) hours as needed. For pain         . IBUPROFEN 200 MG PO CAPS   Oral   Take 2 capsules by mouth once as needed. For headache         . INSULIN GLARGINE 100 UNIT/ML [place] SOLN   Subcutaneous   Inject 13 Units into the skin at bedtime.         . INSULIN LISPRO (HUMAN) 100 UNIT/ML [place] SOLN   Subcutaneous   Inject 2-15 Units into the skin 3 (three) times daily. Sliding scale with every meal         . METOCLOPRAMIDE HCL 10 MG PO TABS   Oral   Take 10 mg by mouth 4 (four) times daily as needed. To block bile duct         . OMEPRAZOLE-SODIUM BICARBONATE 20-1100 MG PO CAPS   Oral   Take 1 capsule by mouth daily.         Marland Kitchen FLUNISOLIDE 250 MCG/ACT IN AERS   Inhalation   Inhale 2 puffs into the lungs 2 (two) times daily as needed. For shortness of breath         . IBUPROFEN 800 MG PO TABS  Oral   Take 800 mg by mouth every 8 (eight) hours as needed. For pain         . INSULIN LISPRO PROT & LISPRO (75-25) 100 UNIT/ML [place] SUSP   Subcutaneous   Inject 20 Units into the skin daily with supper.         . INSULIN ISOPHANE & REGULAR (70-30) 100 UNIT/ML [place] SUSP   Subcutaneous   Inject 20 Units into the skin at bedtime.           BP 110/76  Pulse 117  Temp 98.9 F (37.2 C) (Oral)  Resp 18  Ht 5' 7.75" (1.721 m)  Wt 126 lb (57.153 kg)  BMI 19.30 kg/m2  SpO2 98%  Physical Exam  Nursing note and vitals reviewed. Constitutional: He appears well-developed and well-nourished. No distress.  HENT:  Head: Normocephalic and atraumatic.       Because membranes dry  Eyes: Conjunctivae normal are normal. Pupils are equal, round, and reactive to light.  Neck: Neck supple. No tracheal deviation present. No thyromegaly present.  Cardiovascular: Regular rhythm.   No murmur heard.      Mildly tachycardic  Pulmonary/Chest: Effort normal and breath sounds normal.   Abdominal: Soft. Bowel sounds are normal. He exhibits no distension. There is tenderness.       Mild diffuse tenderness  Genitourinary:       Normal male genitalia  Musculoskeletal: Normal range of motion. He exhibits no edema and no tenderness.  Neurological: He is alert. He exhibits normal muscle tone. Coordination normal.  Skin: Skin is warm and dry. No rash noted.  Psychiatric: He has a normal mood and affect.    ED Course  Procedures (including critical care time)   Labs Reviewed  COMPREHENSIVE METABOLIC PANEL  CBC WITH DIFFERENTIAL  URINALYSIS, ROUTINE W REFLEX MICROSCOPIC  BLOOD GAS, VENOUS   No results found.   No diagnosis found. Results for orders placed during the hospital encounter of 10/02/12  COMPREHENSIVE METABOLIC PANEL      Component Value Range   Sodium 132 (*) 135 - 145 mEq/L   Potassium 3.8  3.5 - 5.1 mEq/L   Chloride 88 (*) 96 - 112 mEq/L   CO2 27  19 - 32 mEq/L   Glucose, Bld 590 (*) 70 - 99 mg/dL   BUN 8  6 - 23 mg/dL   Creatinine, Ser 4.09  0.50 - 1.35 mg/dL   Calcium 9.7  8.4 - 81.1 mg/dL   Total Protein 7.7  6.0 - 8.3 g/dL   Albumin 3.7  3.5 - 5.2 g/dL   AST 11  0 - 37 U/L   ALT 11  0 - 53 U/L   Alkaline Phosphatase 106  39 - 117 U/L   Total Bilirubin 0.7  0.3 - 1.2 mg/dL   GFR calc non Af Amer >90  >90 mL/min   GFR calc Af Amer >90  >90 mL/min  CBC WITH DIFFERENTIAL      Component Value Range   WBC 6.7  4.0 - 10.5 K/uL   RBC 4.53  4.22 - 5.81 MIL/uL   Hemoglobin 12.8 (*) 13.0 - 17.0 g/dL   HCT 91.4 (*) 78.2 - 95.6 %   MCV 80.4  78.0 - 100.0 fL   MCH 28.3  26.0 - 34.0 pg   MCHC 35.2  30.0 - 36.0 g/dL   RDW 21.3  08.6 - 57.8 %   Platelets 348  150 - 400 K/uL   Neutrophils Relative 45  43 - 77 %   Neutro Abs 3.0  1.7 - 7.7 K/uL   Lymphocytes Relative 39  12 - 46 %   Lymphs Abs 2.6  0.7 - 4.0 K/uL   Monocytes Relative 13 (*) 3 - 12 %   Monocytes Absolute 0.8  0.1 - 1.0 K/uL   Eosinophils Relative 2  0 - 5 %   Eosinophils Absolute  0.2  0.0 - 0.7 K/uL   Basophils Relative 1  0 - 1 %   Basophils Absolute 0.1  0.0 - 0.1 K/uL  URINALYSIS, ROUTINE W REFLEX MICROSCOPIC      Component Value Range   Color, Urine YELLOW  YELLOW   APPearance CLEAR  CLEAR   Specific Gravity, Urine 1.037 (*) 1.005 - 1.030   pH 6.5  5.0 - 8.0   Glucose, UA >1000 (*) NEGATIVE mg/dL   Hgb urine dipstick MODERATE (*) NEGATIVE   Bilirubin Urine NEGATIVE  NEGATIVE   Ketones, ur 15 (*) NEGATIVE mg/dL   Protein, ur NEGATIVE  NEGATIVE mg/dL   Urobilinogen, UA 0.2  0.0 - 1.0 mg/dL   Nitrite NEGATIVE  NEGATIVE   Leukocytes, UA NEGATIVE  NEGATIVE  POCT I-STAT 3, BLOOD GAS (G3+)      Component Value Range   pH, Arterial 7.372  7.350 - 7.450   pCO2 arterial 52.9 (*) 35.0 - 45.0 mmHg   pO2, Arterial 46.0 (*) 80.0 - 100.0 mmHg   Bicarbonate 30.7 (*) 20.0 - 24.0 mEq/L   TCO2 32  0 - 100 mmol/L   O2 Saturation 79.0     Acid-Base Excess 4.0 (*) 0.0 - 2.0 mmol/L   Patient temperature 98.9 F     Collection site IV START     Drawn by RT     Sample type ARTERIAL    GLUCOSE, CAPILLARY      Component Value Range   Glucose-Capillary 399 (*) 70 - 99 mg/dL  URINE MICROSCOPIC-ADD ON      Component Value Range   Squamous Epithelial / LPF RARE  RARE   WBC, UA 0-2  <3 WBC/hpf   RBC / HPF 7-10  <3 RBC/hpf  GLUCOSE, CAPILLARY      Component Value Range   Glucose-Capillary 250 (*) 70 - 99 mg/dL  BASIC METABOLIC PANEL      Component Value Range   Sodium 138  135 - 145 mEq/L   Potassium 3.4 (*) 3.5 - 5.1 mEq/L   Chloride 99  96 - 112 mEq/L   CO2 29  19 - 32 mEq/L   Glucose, Bld 288 (*) 70 - 99 mg/dL   BUN 7  6 - 23 mg/dL   Creatinine, Ser 5.78  0.50 - 1.35 mg/dL   Calcium 8.7  8.4 - 46.9 mg/dL   GFR calc non Af Amer >90  >90 mL/min   GFR calc Af Amer >90  >90 mL/min   No results found.   1:45 AM feels improved after treatment with intravenous fluids, hydromorphone and Zofran patient alert no distress   Patient placed on glucose stabilizer. At 4:05  AM he is resting comfortably sleeping easily arousable asymptomatic. He would drink water without difficulty. On further history from patient and from patient's mother he has been noncompliant with insulin regimen, with checking blood sugars and with diet. Potassium chloride 40 mEq by mouth ordered orally prior to discharge MDM  Plan Patient encouraged to check blood sugars before each meal and at bedtime. He is advised to eat 3 meals  daily with snacks in between. He should schedule appointment with Dr.Frye within a week ago over insulin regimen and diet. Urine recheck for blood. Diagnosis #1 vomiting and diarrhea #2hyperglycemia #3 microscopic hematuria #4 dehydration #5 medical noncompliance        Doug Sou, MD 10/02/12 [phone]

## 2012-10-02 NOTE — ED Notes (Signed)
MD at bedside. 

## 2012-10-02 NOTE — ED Notes (Signed)
Pt c/o n/v/d x 2 days 

## 2012-10-02 NOTE — ED Notes (Signed)
Glucose of 590 called from [place] in lab. Dr. Sherri Sear made aware.

## 2012-10-02 NOTE — ED Notes (Signed)
Insulin gtt decreased to 1.9 units/hr per Glucostabilizer.

## 2012-10-02 NOTE — ED Notes (Signed)
Pt reports significant weight loss in last 6 months

## 2012-10-17 ENCOUNTER — Emergency Department (HOSPITAL_BASED_OUTPATIENT_CLINIC_OR_DEPARTMENT_OTHER)
Admission: EM | Admit: 2012-10-17 | Discharge: 2012-10-17 | Disposition: A | Discharge status: Home / Self Care | Payer: Medicaid Other | Attending: Emergency Medicine | Admitting: Emergency Medicine

## 2012-10-17 ENCOUNTER — Encounter (HOSPITAL_BASED_OUTPATIENT_CLINIC_OR_DEPARTMENT_OTHER): Payer: Self-pay | Admitting: Emergency Medicine

## 2012-10-17 DIAGNOSIS — K219 Gastro-esophageal reflux disease without esophagitis: Secondary | ICD-10-CM | POA: Diagnosis not present

## 2012-10-17 DIAGNOSIS — R109 Unspecified abdominal pain: Secondary | ICD-10-CM

## 2012-10-17 DIAGNOSIS — F17200 Nicotine dependence, unspecified, uncomplicated: Secondary | ICD-10-CM | POA: Insufficient documentation

## 2012-10-17 DIAGNOSIS — Z79899 Other long term (current) drug therapy: Secondary | ICD-10-CM | POA: Insufficient documentation

## 2012-10-17 DIAGNOSIS — E109 Type 1 diabetes mellitus without complications: Secondary | ICD-10-CM | POA: Insufficient documentation

## 2012-10-17 DIAGNOSIS — J45909 Unspecified asthma, uncomplicated: Secondary | ICD-10-CM | POA: Diagnosis not present

## 2012-10-17 DIAGNOSIS — Z794 Long term (current) use of insulin: Secondary | ICD-10-CM | POA: Diagnosis not present

## 2012-10-17 DIAGNOSIS — R112 Nausea with vomiting, unspecified: Secondary | ICD-10-CM

## 2012-10-17 DIAGNOSIS — Z8614 Personal history of Methicillin resistant Staphylococcus aureus infection: Secondary | ICD-10-CM | POA: Insufficient documentation

## 2012-10-17 DIAGNOSIS — R1033 Periumbilical pain: Secondary | ICD-10-CM | POA: Insufficient documentation

## 2012-10-17 LAB — KETONES, QUALITATIVE: Acetone, Bld: NEGATIVE

## 2012-10-17 LAB — COMPREHENSIVE METABOLIC PANEL
ALT: 9 U/L (ref 0–53)
Alkaline Phosphatase: 109 U/L (ref 39–117)
BUN: 10 mg/dL (ref 6–23)
CO2: 30 mEq/L (ref 19–32)
GFR calc Af Amer: >90 mL/min (ref >90)
GFR calc non Af Amer: >90 mL/min (ref >90)
Glucose, Bld: 327 mg/dL — ABNORMAL HIGH (ref 70–99)
Potassium: 3.7 mEq/L (ref 3.5–5.1)
Sodium: 134 mEq/L — ABNORMAL LOW (ref 135–145)
Total Bilirubin: 0.6 mg/dL (ref 0.3–1.2)

## 2012-10-17 LAB — RAPID URINE DRUG SCREEN, HOSP PERFORMED
Barbiturates: NOT DETECTED
Tetrahydrocannabinol: NOT DETECTED

## 2012-10-17 LAB — GLUCOSE, CAPILLARY
Glucose-Capillary: 281 mg/dL — ABNORMAL HIGH (ref 70–99)
Glucose-Capillary: 294 mg/dL — ABNORMAL HIGH (ref 70–99)

## 2012-10-17 MED ORDER — METOCLOPRAMIDE HCL 5 MG/ML IJ SOLN
10.0000 mg | Freq: Once | INTRAMUSCULAR | Status: AC
  Administered 2012-10-17: 10 mg via INTRAVENOUS
  Filled 2012-10-17: qty 2

## 2012-10-17 MED ORDER — HYDROMORPHONE HCL PF 1 MG/ML IJ SOLN
1.0000 mg | Freq: Once | INTRAMUSCULAR | Status: AC
  Administered 2012-10-17: 1 mg via INTRAVENOUS
  Filled 2012-10-17: qty 1

## 2012-10-17 MED ORDER — SODIUM CHLORIDE 0.9 % IV SOLN
1000.0000 mL | INTRAVENOUS | Status: DC
  Administered 2012-10-17: 1000 mL via INTRAVENOUS

## 2012-10-17 MED ORDER — PROMETHAZINE HCL 25 MG/ML IJ SOLN
25.0000 mg | Freq: Once | INTRAMUSCULAR | Status: AC
  Administered 2012-10-17: 25 mg via INTRAVENOUS
  Filled 2012-10-17: qty 1

## 2012-10-17 MED ORDER — HYDROCODONE-ACETAMINOPHEN 5-325 MG PO TABS: 1.0000 | ORAL_TABLET | Freq: Four times a day (QID) | ORAL | Status: DC | PRN

## 2012-10-17 MED ORDER — SODIUM CHLORIDE 0.9 % IV SOLN
1000.0000 mL | Freq: Once | INTRAVENOUS | Status: AC
  Administered 2012-10-17: 1000 mL via INTRAVENOUS

## 2012-10-17 NOTE — ED Notes (Signed)
Pt was given a prescription for zofran 12/26 which he did not have filled for unkown reason. Pt has hydrocodone at home for pain but has not taken it either.

## 2012-10-17 NOTE — ED Notes (Signed)
MD at bedside. 

## 2012-10-17 NOTE — ED Notes (Signed)
Pt d/c home with family to drive- rx x 1 given for hydrocodone

## 2012-10-17 NOTE — ED Provider Notes (Signed)
History     CSN: [id number]  Arrival date & time 10/17/12  0225   First MD Initiated Contact with Patient 10/17/12 0236      Chief Complaint  Patient presents with  . Emesis  . Abdominal Pain    (Consider location/radiation/quality/duration/timing/severity/associated sxs/prior treatment) HPITrenton W [name] is a 27 y.o. male complaining about abdominal pain and vomiting since Christmas. Patient was seen on 1226 and is had similar symptoms since then. Patient is type I diabetic. He said he is able to tolerate fluids but when he eats solids sometimes he vomits and sometimes he does not. He's had no hematemesis, no hematochezia or melena. His abdominal pain is periumbilical, it is crampy, comes and goes currently it is severe. Patient has not taken anything at home to help with that. Denies fevers, chills, shortness of breath, chest pain, rash or myalgias.    Past Medical History  Diagnosis Date  . Diabetes mellitus   . GERD (gastroesophageal reflux disease)   . Asthma   . Hx MRSA infection     on face    Past Surgical History  Procedure Date  . Tonsillectomy     Family History  Problem Relation Age of Onset  . Diabetes Father   . Asthma      fhx  . Hypertension      fhx  . Stroke      fhx    History  Substance Use Topics  . Smoking status: Current Every Day Smoker  . Smokeless tobacco: Not on file  . Alcohol Use: No      Review of Systems At least 10pt or greater review of systems completed and are negative except where specified in the HPI.  Allergies  Cefuroxime axetil; Penicillins; Tessalon; and Shellfish allergy  Home Medications   Current Outpatient Rx  Name  Route  Sig  Dispense  Refill  . ALBUTEROL SULFATE HFA 108 (90 BASE) MCG/ACT IN AERS   Inhalation   Inhale 2 puffs into the lungs every 6 (six) hours as needed. For shortness of breath         . FLUNISOLIDE 250 MCG/ACT IN AERS   Inhalation   Inhale 2 puffs into the lungs 2 (two) times  daily as needed. For shortness of breath         . HYDROCODONE-ACETAMINOPHEN 7.5-750 MG PO TABS   Oral   Take 1 tablet by mouth every 6 (six) hours as needed. For pain         . IBUPROFEN 200 MG PO CAPS   Oral   Take 2 capsules by mouth once as needed. For headache         . IBUPROFEN 800 MG PO TABS   Oral   Take 800 mg by mouth every 8 (eight) hours as needed. For pain         . INSULIN GLARGINE 100 UNIT/ML [place] SOLN   Subcutaneous   Inject 13 Units into the skin at bedtime.         . INSULIN LISPRO (HUMAN) 100 UNIT/ML [place] SOLN   Subcutaneous   Inject 2-15 Units into the skin 3 (three) times daily. Sliding scale with every meal         . INSULIN LISPRO PROT & LISPRO (75-25) 100 UNIT/ML [place] SUSP   Subcutaneous   Inject 20 Units into the skin daily with supper.         . INSULIN ISOPHANE & REGULAR (70-30) 100 UNIT/ML [place] SUSP  Subcutaneous   Inject 20 Units into the skin at bedtime.         Marland Kitchen METOCLOPRAMIDE HCL 10 MG PO TABS   Oral   Take 10 mg by mouth 4 (four) times daily as needed. To block bile duct         . OMEPRAZOLE-SODIUM BICARBONATE 20-1100 MG PO CAPS   Oral   Take 1 capsule by mouth daily.         Marland Kitchen ONDANSETRON HCL 4 MG PO TABS   Oral   Take 2 tablets (8 mg total) by mouth every 6 (six) hours.   12 tablet   0     BP 123/79  Pulse 110  Temp 98.8 F (37.1 C) (Oral)  Resp 20  SpO2 100%  Physical Exam  Nursing notes reviewed.  Electronic medical record reviewed. VITAL SIGNS:   Filed Vitals:   10/17/12 0229 10/17/12 0534  BP: 143/91 123/79  Pulse: 106 110  Temp: 98.8 F (37.1 C)   TempSrc: Oral   Resp: 18 20  SpO2: 98% 100%   CONSTITUTIONAL: Awake, oriented, appears non-toxic, smells like cigarette HENT: Atraumatic, normocephalic, oral mucosa pink and moist, airway patent. Nares patent without drainage. External ears normal. EYES: Conjunctiva clear, EOMI, PERRLA NECK: Trachea midline, non-tender, supple CARDIOVASCULAR: Normal  heart rate, Normal rhythm, No murmurs, rubs, gallops PULMONARY/CHEST: Clear to auscultation, no rhonchi, wheezes, or rales. Symmetrical breath sounds. Non-tender. ABDOMINAL: Non-distended, soft, diffuse tenderness to palpation without rebound or guarding. BS normal. NEUROLOGIC: Non-focal, moving all four extremities, no gross sensory or motor deficits. EXTREMITIES: No clubbing, cyanosis, or edema SKIN: Warm, Dry, No erythema, No rash  ED Course  Procedures (including critical care time)  Labs Reviewed  COMPREHENSIVE METABOLIC PANEL - Abnormal; Notable for the following:    Sodium 134 (*)     Chloride 92 (*)     Glucose, Bld 327 (*)     All other components within normal limits  GLUCOSE, CAPILLARY - Abnormal; Notable for the following:    Glucose-Capillary 281 (*)     All other components within normal limits  GLUCOSE, CAPILLARY - Abnormal; Notable for the following:    Glucose-Capillary 294 (*)     All other components within normal limits  LIPASE, BLOOD  ETHANOL  URINE RAPID DRUG SCREEN (HOSP PERFORMED)  KETONES, QUALITATIVE   No results found.   1. Abdominal pain   2. Nausea and vomiting     fg  MDM  Jason [name] is a 27 y.o. male the history of abdominal pain and nausea and vomiting presents with the same.  Patient's labs are fairly unremarkable-he is not a diabetic ketoacidosis, his glucose is elevated but do not think his hyperglycemia is causing the abdominal pain. It is possible given the fact that he is 27 years old with diabetes type 1 and he has got some gastroparesis and a gastroparesis flare over the last week or so. Patient was helped by pain medicine was even further helped by Reglan. He says he has not taken Reglan at home because he did not know what was 4. Do not see any reason to pursue any further testing including imaging at this time. His abdomen is benign his pain is gone. Glucose has come down with simple fluids. We'll discharge home stable and in good  condition, he can use Reglan or pain medicine as prescribed for the pain. He is to followup with Dr. Clent Ridges in the next week to discuss further management.  Jones Skene, MD 10/17/12 [phone]

## 2012-10-17 NOTE — ED Notes (Signed)
Pt c/o of abd pain with vomiting since since Christmas. Pt was seen here on 12/26 for same.

## 2012-11-01 ENCOUNTER — Emergency Department (HOSPITAL_BASED_OUTPATIENT_CLINIC_OR_DEPARTMENT_OTHER)
Admission: EM | Admit: 2012-11-01 | Discharge: 2012-11-01 | Disposition: A | Discharge status: Home / Self Care | Payer: Medicaid Other | Attending: Emergency Medicine | Admitting: Emergency Medicine

## 2012-11-01 ENCOUNTER — Encounter (HOSPITAL_BASED_OUTPATIENT_CLINIC_OR_DEPARTMENT_OTHER): Payer: Self-pay | Admitting: *Deleted

## 2012-11-01 DIAGNOSIS — F17200 Nicotine dependence, unspecified, uncomplicated: Secondary | ICD-10-CM | POA: Insufficient documentation

## 2012-11-01 DIAGNOSIS — Z8614 Personal history of Methicillin resistant Staphylococcus aureus infection: Secondary | ICD-10-CM | POA: Diagnosis not present

## 2012-11-01 DIAGNOSIS — Z794 Long term (current) use of insulin: Secondary | ICD-10-CM | POA: Diagnosis not present

## 2012-11-01 DIAGNOSIS — J45909 Unspecified asthma, uncomplicated: Secondary | ICD-10-CM | POA: Insufficient documentation

## 2012-11-01 DIAGNOSIS — E162 Hypoglycemia, unspecified: Secondary | ICD-10-CM

## 2012-11-01 DIAGNOSIS — K219 Gastro-esophageal reflux disease without esophagitis: Secondary | ICD-10-CM | POA: Diagnosis not present

## 2012-11-01 DIAGNOSIS — N390 Urinary tract infection, site not specified: Secondary | ICD-10-CM | POA: Diagnosis not present

## 2012-11-01 DIAGNOSIS — E119 Type 2 diabetes mellitus without complications: Secondary | ICD-10-CM | POA: Insufficient documentation

## 2012-11-01 DIAGNOSIS — Z79899 Other long term (current) drug therapy: Secondary | ICD-10-CM | POA: Insufficient documentation

## 2012-11-01 LAB — CBC WITH DIFFERENTIAL/PLATELET
Basophils Absolute: 0.1 10*3/uL (ref 0.0–0.1)
Eosinophils Absolute: 0.2 10*3/uL (ref 0.0–0.7)
Eosinophils Relative: 3 % (ref 0–5)
Lymphocytes Relative: 23 % (ref 12–46)
MCV: 82.8 fL (ref 78.0–100.0)
Platelets: 381 10*3/uL (ref 150–400)
RDW: 12.4 % (ref 11.5–15.5)
WBC: 9.1 10*3/uL (ref 4.0–10.5)

## 2012-11-01 LAB — RAPID URINE DRUG SCREEN, HOSP PERFORMED
Amphetamines: NOT DETECTED
Cocaine: NOT DETECTED
Opiates: POSITIVE — AB
Tetrahydrocannabinol: NOT DETECTED

## 2012-11-01 LAB — URINE MICROSCOPIC-ADD ON

## 2012-11-01 LAB — URINALYSIS, ROUTINE W REFLEX MICROSCOPIC
Ketones, ur: NEGATIVE mg/dL
Leukocytes, UA: NEGATIVE
Nitrite: NEGATIVE
Protein, ur: NEGATIVE mg/dL
pH: 7 (ref 5.0–8.0)

## 2012-11-01 LAB — BASIC METABOLIC PANEL
CO2: 28 mEq/L (ref 19–32)
Calcium: 9.4 mg/dL (ref 8.4–10.5)
Creatinine, Ser: 0.7 mg/dL (ref 0.50–1.35)
GFR calc Af Amer: >90 mL/min (ref >90)
GFR calc non Af Amer: >90 mL/min (ref >90)

## 2012-11-01 LAB — ETHANOL: Alcohol, Ethyl (B): <11 mg/dL (ref 0–11)

## 2012-11-01 LAB — GLUCOSE, CAPILLARY

## 2012-11-01 MED ORDER — CIPROFLOXACIN HCL 500 MG PO TABS
500.0000 mg | ORAL_TABLET | Freq: Once | ORAL | Status: AC
  Administered 2012-11-01: 500 mg via ORAL
  Filled 2012-11-01: qty 1

## 2012-11-01 MED ORDER — CEPHALEXIN 250 MG PO CAPS: 500.0000 mg | ORAL_CAPSULE | Freq: Once | ORAL | Status: DC

## 2012-11-01 MED ORDER — CIPROFLOXACIN HCL 500 MG PO TABS: 500.0000 mg | ORAL_TABLET | Freq: Two times a day (BID) | ORAL | Status: DC

## 2012-11-01 NOTE — ED Notes (Signed)
Patient becoming more alert than at arrival, able to eat and drink by himself

## 2012-11-01 NOTE — ED Notes (Signed)
The patient's CBG is 228.

## 2012-11-01 NOTE — ED Notes (Signed)
CBG was 106 on arrival

## 2012-11-01 NOTE — ED Provider Notes (Signed)
History     CSN: [id number]  Arrival date & time 11/01/12  0857   None   level 5 altered loc  Chief Complaint  Patient presents with  . Hypoglycemia    (Consider location/radiation/quality/duration/timing/severity/associated sxs/prior treatment) HPI  27 y.o. Male with iddm awoke yelling and incoherent.  Wife noted sweaty consistent with how he is when hypoglycemic and attempted to feed, but unable to get himto take po.  Called ems whoarrived tofing patient lethargic.  BS 55.  D10 given and more awake with bs 171. No complaints now except cold.   Past Medical History  Diagnosis Date  . Diabetes mellitus   . GERD (gastroesophageal reflux disease)   . Asthma   . Hx MRSA infection     on face    Past Surgical History  Procedure Date  . Tonsillectomy     Family History  Problem Relation Age of Onset  . Diabetes Father   . Asthma      fhx  . Hypertension      fhx  . Stroke      fhx    History  Substance Use Topics  . Smoking status: Current Every Day Smoker  . Smokeless tobacco: Not on file  . Alcohol Use: No      Review of Systems  HENT: Positive for congestion.   All other systems reviewed and are negative.    Allergies  Cefuroxime axetil; Penicillins; Tessalon; and Shellfish allergy  Home Medications   Current Outpatient Rx  Name  Route  Sig  Dispense  Refill  . ALBUTEROL SULFATE HFA 108 (90 BASE) MCG/ACT IN AERS   Inhalation   Inhale 2 puffs into the lungs every 6 (six) hours as needed. For shortness of breath         . FLUNISOLIDE 250 MCG/ACT IN AERS   Inhalation   Inhale 2 puffs into the lungs 2 (two) times daily as needed. For shortness of breath         . HYDROCODONE-ACETAMINOPHEN 5-325 MG PO TABS   Oral   Take 1-2 tablets by mouth every 6 (six) hours as needed for pain.   17 tablet   0   . HYDROCODONE-ACETAMINOPHEN 7.5-750 MG PO TABS   Oral   Take 1 tablet by mouth every 6 (six) hours as needed. For pain         . IBUPROFEN  200 MG PO CAPS   Oral   Take 2 capsules by mouth once as needed. For headache         . IBUPROFEN 800 MG PO TABS   Oral   Take 800 mg by mouth every 8 (eight) hours as needed. For pain         . INSULIN GLARGINE 100 UNIT/ML [place] SOLN   Subcutaneous   Inject 13 Units into the skin at bedtime.         . INSULIN LISPRO (HUMAN) 100 UNIT/ML [place] SOLN   Subcutaneous   Inject 2-15 Units into the skin 3 (three) times daily. Sliding scale with every meal         . INSULIN LISPRO PROT & LISPRO (75-25) 100 UNIT/ML [place] SUSP   Subcutaneous   Inject 20 Units into the skin daily with supper.         . INSULIN ISOPHANE & REGULAR (70-30) 100 UNIT/ML [place] SUSP   Subcutaneous   Inject 20 Units into the skin at bedtime.         Marland Kitchen  METOCLOPRAMIDE HCL 10 MG PO TABS   Oral   Take 10 mg by mouth 4 (four) times daily as needed. To block bile duct         . OMEPRAZOLE-SODIUM BICARBONATE 20-1100 MG PO CAPS   Oral   Take 1 capsule by mouth daily.         Marland Kitchen ONDANSETRON HCL 4 MG PO TABS   Oral   Take 2 tablets (8 mg total) by mouth every 6 (six) hours.   12 tablet   0     BP 122/88  Pulse 89  Resp 17  SpO2 100%  Physical Exam  Nursing note and vitals reviewed. Constitutional: He is oriented to person, place, and time. He appears well-developed and well-nourished.  HENT:  Head: Normocephalic and atraumatic.  Right Ear: External ear normal.  Left Ear: External ear normal.  Mouth/Throat: Oropharynx is clear and moist.  Eyes: Conjunctivae normal and EOM are normal. Pupils are equal, round, and reactive to light.  Neck: Normal range of motion. Neck supple.  Cardiovascular: Normal rate, regular rhythm and normal heart sounds.   Pulmonary/Chest: Effort normal and breath sounds normal.  Abdominal: Soft. Bowel sounds are normal.  Musculoskeletal: Normal range of motion.  Neurological: He is alert and oriented to person, place, and time. He has normal reflexes.  Skin: Skin is warm and  dry.  Psychiatric: He has a normal mood and affect. His behavior is normal. Thought content normal.    ED Course  Procedures (including critical care time)  Labs Reviewed - No data to display No results found.   No diagnosis found.    MDM  IDDM presented today with altered ms due to hypoglycemia. Work up revealed improved bs with po intake after arrival, hypothermia likely due to hypoglycemia, resolving here,and possible uti with rbc and wbc 0-2 and bacteria on microscopic.  Patient given cipro and advised recheck with Dr. Abran Cantor on Monday.       Hilario Quarry, MD 11/01/12 [phone]

## 2012-11-01 NOTE — ED Notes (Signed)
Patient ambulated in department w/o assistance

## 2012-11-01 NOTE — ED Notes (Signed)
ems called because patient was lethargic, glucose 55 at arrival to home, given 250cc D10 and glucose 171.  takes vicodin for neuropathy

## 2012-11-03 LAB — GLUCOSE, CAPILLARY: Glucose-Capillary: 106 mg/dL — ABNORMAL HIGH (ref 70–99)

## 2012-12-04 ENCOUNTER — Emergency Department (HOSPITAL_BASED_OUTPATIENT_CLINIC_OR_DEPARTMENT_OTHER)
Admission: EM | Admit: 2012-12-04 | Discharge: 2012-12-04 | Disposition: A | Discharge status: Home / Self Care | Payer: Medicaid Other | Attending: Emergency Medicine | Admitting: Emergency Medicine

## 2012-12-04 ENCOUNTER — Telehealth: Payer: Self-pay | Admitting: Family Medicine

## 2012-12-04 ENCOUNTER — Encounter (HOSPITAL_BASED_OUTPATIENT_CLINIC_OR_DEPARTMENT_OTHER): Payer: Self-pay | Admitting: *Deleted

## 2012-12-04 DIAGNOSIS — J45909 Unspecified asthma, uncomplicated: Secondary | ICD-10-CM | POA: Insufficient documentation

## 2012-12-04 DIAGNOSIS — L02419 Cutaneous abscess of limb, unspecified: Secondary | ICD-10-CM | POA: Diagnosis not present

## 2012-12-04 DIAGNOSIS — R42 Dizziness and giddiness: Secondary | ICD-10-CM | POA: Insufficient documentation

## 2012-12-04 DIAGNOSIS — R5381 Other malaise: Secondary | ICD-10-CM | POA: Insufficient documentation

## 2012-12-04 DIAGNOSIS — L0390 Cellulitis, unspecified: Secondary | ICD-10-CM

## 2012-12-04 DIAGNOSIS — K219 Gastro-esophageal reflux disease without esophagitis: Secondary | ICD-10-CM | POA: Insufficient documentation

## 2012-12-04 DIAGNOSIS — R55 Syncope and collapse: Secondary | ICD-10-CM

## 2012-12-04 DIAGNOSIS — H9209 Otalgia, unspecified ear: Secondary | ICD-10-CM | POA: Insufficient documentation

## 2012-12-04 DIAGNOSIS — Z8614 Personal history of Methicillin resistant Staphylococcus aureus infection: Secondary | ICD-10-CM | POA: Insufficient documentation

## 2012-12-04 DIAGNOSIS — Z794 Long term (current) use of insulin: Secondary | ICD-10-CM | POA: Insufficient documentation

## 2012-12-04 DIAGNOSIS — E1169 Type 2 diabetes mellitus with other specified complication: Secondary | ICD-10-CM | POA: Insufficient documentation

## 2012-12-04 DIAGNOSIS — Z79899 Other long term (current) drug therapy: Secondary | ICD-10-CM | POA: Insufficient documentation

## 2012-12-04 DIAGNOSIS — F17200 Nicotine dependence, unspecified, uncomplicated: Secondary | ICD-10-CM | POA: Insufficient documentation

## 2012-12-04 DIAGNOSIS — R739 Hyperglycemia, unspecified: Secondary | ICD-10-CM

## 2012-12-04 LAB — URINE MICROSCOPIC-ADD ON

## 2012-12-04 LAB — CBC WITH DIFFERENTIAL/PLATELET
Basophils Absolute: 0.1 10*3/uL (ref 0.0–0.1)
Basophils Relative: 1 % (ref 0–1)
Eosinophils Absolute: 0.4 10*3/uL (ref 0.0–0.7)
Eosinophils Relative: 4 % (ref 0–5)
HCT: 35.7 % — ABNORMAL LOW (ref 39.0–52.0)
Lymphocytes Relative: 24 % (ref 12–46)
MCH: 27.5 pg (ref 26.0–34.0)
MCHC: 33.9 g/dL (ref 30.0–36.0)
MCV: 81.1 fL (ref 78.0–100.0)
Monocytes Absolute: 1.3 10*3/uL — ABNORMAL HIGH (ref 0.1–1.0)
Platelets: 497 10*3/uL — ABNORMAL HIGH (ref 150–400)
RDW: 12.5 % (ref 11.5–15.5)
WBC: 10.2 10*3/uL (ref 4.0–10.5)

## 2012-12-04 LAB — COMPREHENSIVE METABOLIC PANEL
ALT: 8 U/L (ref 0–53)
AST: 12 U/L (ref 0–37)
CO2: 30 mEq/L (ref 19–32)
Calcium: 9.8 mg/dL (ref 8.4–10.5)
Creatinine, Ser: 1 mg/dL (ref 0.50–1.35)
GFR calc Af Amer: >90 mL/min (ref >90)
GFR calc non Af Amer: >90 mL/min (ref >90)
Sodium: 135 mEq/L (ref 135–145)
Total Protein: 8.5 g/dL — ABNORMAL HIGH (ref 6.0–8.3)

## 2012-12-04 LAB — URINALYSIS, ROUTINE W REFLEX MICROSCOPIC
Glucose, UA: 100 mg/dL — AB
Ketones, ur: 15 mg/dL — AB
Protein, ur: >300 mg/dL — AB
Urobilinogen, UA: 1 mg/dL (ref 0.0–1.0)

## 2012-12-04 MED ORDER — CLINDAMYCIN HCL 300 MG PO CAPS: 300.0000 mg | ORAL_CAPSULE | Freq: Three times a day (TID) | ORAL | Status: DC

## 2012-12-04 NOTE — Telephone Encounter (Signed)
Pt would like a referral to [facility] in [place] phone # [phone]

## 2012-12-04 NOTE — ED Provider Notes (Addendum)
History     CSN: [id number]  Arrival date & time 12/04/12  1628   First MD Initiated Contact with Patient 12/04/12 1729      Chief Complaint  Patient presents with  . Otalgia    (Consider location/radiation/quality/duration/timing/severity/associated sxs/prior treatment) HPI Comments: Patient with history of IDDM presents with complaints of sores on legs for the past two months.  He is now feeling weak, dizzy, and has passed out recently.  He denies fevers or chills.  His sugars has been controlled.  The sores on the legs seem to be getting worse and are draining more.  No aggravating or alleviating factors.    The history is provided by the patient.    Past Medical History  Diagnosis Date  . Diabetes mellitus   . GERD (gastroesophageal reflux disease)   . Asthma   . Hx MRSA infection     on face    Past Surgical History  Procedure Laterality Date  . Tonsillectomy      Family History  Problem Relation Age of Onset  . Diabetes Father   . Asthma      fhx  . Hypertension      fhx  . Stroke      fhx    History  Substance Use Topics  . Smoking status: Current Every Day Smoker  . Smokeless tobacco: Not on file  . Alcohol Use: No      Review of Systems  Constitutional: Positive for fatigue. Negative for fever and chills.  All other systems reviewed and are negative.    Allergies  Cefuroxime axetil; Penicillins; Tessalon; and Shellfish allergy  Home Medications   Current Outpatient Rx  Name  Route  Sig  Dispense  Refill  . albuterol (PROVENTIL HFA;VENTOLIN HFA) 108 (90 BASE) MCG/ACT inhaler   Inhalation   Inhale 2 puffs into the lungs every 6 (six) hours as needed. For shortness of breath         . ciprofloxacin (CIPRO) 500 MG tablet   Oral   Take 1 tablet (500 mg total) by mouth every 12 (twelve) hours.   10 tablet   0   . flunisolide (AEROBID) 250 MCG/ACT inhaler   Inhalation   Inhale 2 puffs into the lungs 2 (two) times daily as needed. For  shortness of breath         . HYDROcodone-acetaminophen (NORCO/VICODIN) 5-325 MG per tablet   Oral   Take 1-2 tablets by mouth every 6 (six) hours as needed for pain.   17 tablet   0   . HYDROcodone-acetaminophen (VICODIN ES) 7.5-750 MG per tablet   Oral   Take 1 tablet by mouth every 6 (six) hours as needed. For pain         . Ibuprofen (ADVIL MIGRAINE) 200 MG CAPS   Oral   Take 2 capsules by mouth once as needed. For headache         . ibuprofen (ADVIL,MOTRIN) 800 MG tablet   Oral   Take 800 mg by mouth every 8 (eight) hours as needed. For pain         . insulin glargine (LANTUS) 100 UNIT/ML injection   Subcutaneous   Inject 13 Units into the skin at bedtime.         . insulin lispro (HUMALOG) 100 UNIT/ML injection   Subcutaneous   Inject 2-15 Units into the skin 3 (three) times daily. Sliding scale with every meal         .  insulin lispro protamine-insulin lispro (HUMALOG 75/25) (75-25) 100 UNIT/ML SUSP   Subcutaneous   Inject 20 Units into the skin daily with supper.         . insulin NPH-insulin regular (NOVOLIN 70/30) (70-30) 100 UNIT/ML injection   Subcutaneous   Inject 20 Units into the skin at bedtime.         . metoCLOPramide (REGLAN) 10 MG tablet   Oral   Take 10 mg by mouth 4 (four) times daily as needed. To block bile duct         . Omeprazole-Sodium Bicarbonate (ZEGERID OTC) 20-1100 MG CAPS   Oral   Take 1 capsule by mouth daily.         . ondansetron (ZOFRAN) 4 MG tablet   Oral   Take 2 tablets (8 mg total) by mouth every 6 (six) hours.   12 tablet   0     BP 109/83  Pulse 99  Temp(Src) 98 F (36.7 C) (Oral)  Resp 18  SpO2 100%  Physical Exam  Nursing note and vitals reviewed. Constitutional: He is oriented to person, place, and time. He appears well-developed and well-nourished. No distress.  HENT:  Head: Normocephalic and atraumatic.  Mouth/Throat: Oropharynx is clear and moist.  Eyes: EOM are normal. Pupils are  equal, round, and reactive to light.  Neck: Normal range of motion. Neck supple.  Cardiovascular: Normal rate and regular rhythm.   No murmur heard. Pulmonary/Chest: Effort normal and breath sounds normal. No respiratory distress. He has no wheezes.  Abdominal: Soft. Bowel sounds are normal. He exhibits no distension. There is no tenderness.  Musculoskeletal: Normal range of motion. He exhibits no edema.  Lymphadenopathy:    He has no cervical adenopathy.  Neurological: He is alert and oriented to person, place, and time.  Skin: He is not diaphoretic.  There are lesions on both legs that are excoriated and draining serosanguinous fluid.  There are multiple of these on both legs that are about the size of a quarter.    ED Course  Procedures (including critical care time)  Labs Reviewed  URINALYSIS, ROUTINE W REFLEX MICROSCOPIC - Abnormal; Notable for the following:    Color, Urine AMBER (*)    APPearance TURBID (*)    Specific Gravity, Urine 1.035 (*)    Glucose, UA 100 (*)    Hgb urine dipstick LARGE (*)    Bilirubin Urine MODERATE (*)    Ketones, ur 15 (*)    Protein, ur >300 (*)    All other components within normal limits  CBC WITH DIFFERENTIAL - Abnormal; Notable for the following:    Hemoglobin 12.1 (*)    HCT 35.7 (*)    Platelets 497 (*)    Monocytes Relative 13 (*)    Monocytes Absolute 1.3 (*)    All other components within normal limits  COMPREHENSIVE METABOLIC PANEL - Abnormal; Notable for the following:    Chloride 94 (*)    Glucose, Bld 212 (*)    Total Protein 8.5 (*)    Alkaline Phosphatase 135 (*)    Total Bilirubin 0.2 (*)    All other components within normal limits  URINE MICROSCOPIC-ADD ON   No results found.   No diagnosis found.   Date: 12/04/2012  Rate: 97  Rhythm: normal sinus rhythm  QRS Axis: normal  Intervals: normal  ST/T Wave abnormalities: early repolarization  Conduction Disutrbances:none  Narrative Interpretation:   Old EKG  Reviewed: unchanged    MDM  I have found no reason for the syncope and the labs are unremarkable with the exception of the sugar.  Will treat with clindamycin for the lesions on the legs as he has a history of mrsa.            Geoffery Lyons, MD 12/04/12 Norberta Keens  Geoffery Lyons, MD 12/04/12 [phone]

## 2012-12-04 NOTE — ED Notes (Signed)
Left ear pain. Sores on his legs. States his urine is cloudy.

## 2012-12-05 NOTE — Telephone Encounter (Signed)
He will need an OV with me first. I have not seen him in years

## 2012-12-05 NOTE — Telephone Encounter (Signed)
[facility] is [facility].

## 2012-12-05 NOTE — Telephone Encounter (Signed)
We can do this but is this to a doctor (if so who?) or to a nurse educator,etc? Get some more info please

## 2012-12-05 NOTE — Telephone Encounter (Signed)
I spoke with pt and he needs to schedule the office visit.

## 2012-12-08 NOTE — Telephone Encounter (Signed)
Pt has been sch

## 2012-12-09 ENCOUNTER — Ambulatory Visit (INDEPENDENT_AMBULATORY_CARE_PROVIDER_SITE_OTHER): Payer: Medicaid Other | Admitting: Family Medicine

## 2012-12-09 ENCOUNTER — Encounter: Payer: Self-pay | Admitting: Family Medicine

## 2012-12-09 VITALS — BP 110/78 | HR 108 | Temp 98.7°F | Wt 130.0 lb

## 2012-12-09 DIAGNOSIS — E1065 Type 1 diabetes mellitus with hyperglycemia: Secondary | ICD-10-CM

## 2012-12-09 MED ORDER — INSULIN ASPART 100 UNIT/ML ~~LOC~~ SOLN: SUBCUTANEOUS | Status: DC

## 2012-12-09 MED ORDER — ZOLPIDEM TARTRATE 10 MG PO TABS: 10.0000 mg | ORAL_TABLET | Freq: Every evening | ORAL | Status: DC | PRN

## 2012-12-09 NOTE — Progress Notes (Signed)
  Subjective:    Patient ID: Jason [name], male    DOB: 1986-03-06, 27 y.o.   MRN: [id number]  HPI Here to discuss numerous issues. His main problem over the past 10 years has been his diabetes which has been very poorly controlled. He has been non-compliant with diet and with medications, and his glucoses are usually very high. His last A1c was drawn a year ago and it was over 14, and the year before that it was 14. He had been seeing Dr. Talmage Nap for this but had to stop seeing her due to lack of medical insurance. He has not seen her in about 2 years. He has had numerous admissions for DKA over the years. His glucoses at home are often in the 300s or more. He describes feeling dizzy often , of having erection problems, of chronic pains all over his body, of trouble sleeping, and of sores over the legs. He has taken Vicodin off and on for years with mixed results. He refuses any lab testing here today because of cost concerns.    Review of Systems  Respiratory: Negative.   Cardiovascular: Negative.   Gastrointestinal: Negative.   Musculoskeletal: Positive for myalgias and arthralgias.  Neurological: Negative.   Psychiatric/Behavioral: Positive for sleep disturbance.       Objective:   Physical Exam  Constitutional: He is oriented to person, place, and time. He appears well-developed and well-nourished.  Eyes: Conjunctivae are normal. Pupils are equal, round, and reactive to light.  Neck: No thyromegaly present.  Cardiovascular: Normal rate, regular rhythm, normal heart sounds and intact distal pulses.   Pulmonary/Chest: Effort normal and breath sounds normal.  Lymphadenopathy:    He has no cervical adenopathy.  Neurological: He is alert and oriented to person, place, and time.          Assessment & Plan:  Almost all of his problems stem from out of control diabetes, and he understands this. I gave him information about people he can call to meet with him and to help him sign up  for marketplace insurance plans through the [facility] website. We gave him some samples of Novolog insulin. I will refer him to see [facility] to help get his diabetes under better control. Try Zolpidem for sleep.

## 2012-12-19 ENCOUNTER — Ambulatory Visit: Payer: Self-pay | Admitting: Internal Medicine

## 2013-01-12 ENCOUNTER — Emergency Department (HOSPITAL_BASED_OUTPATIENT_CLINIC_OR_DEPARTMENT_OTHER)
Admission: EM | Admit: 2013-01-12 | Discharge: 2013-01-12 | Disposition: A | Discharge status: Home / Self Care | Payer: Medicaid Other | Attending: Emergency Medicine | Admitting: Emergency Medicine

## 2013-01-12 ENCOUNTER — Encounter (HOSPITAL_BASED_OUTPATIENT_CLINIC_OR_DEPARTMENT_OTHER): Payer: Self-pay

## 2013-01-12 DIAGNOSIS — E119 Type 2 diabetes mellitus without complications: Secondary | ICD-10-CM

## 2013-01-12 DIAGNOSIS — Z8614 Personal history of Methicillin resistant Staphylococcus aureus infection: Secondary | ICD-10-CM | POA: Diagnosis not present

## 2013-01-12 DIAGNOSIS — Z794 Long term (current) use of insulin: Secondary | ICD-10-CM | POA: Diagnosis not present

## 2013-01-12 DIAGNOSIS — F17200 Nicotine dependence, unspecified, uncomplicated: Secondary | ICD-10-CM | POA: Diagnosis not present

## 2013-01-12 DIAGNOSIS — L02415 Cutaneous abscess of right lower limb: Secondary | ICD-10-CM

## 2013-01-12 DIAGNOSIS — K219 Gastro-esophageal reflux disease without esophagitis: Secondary | ICD-10-CM | POA: Diagnosis not present

## 2013-01-12 DIAGNOSIS — Z79899 Other long term (current) drug therapy: Secondary | ICD-10-CM | POA: Diagnosis not present

## 2013-01-12 DIAGNOSIS — E1169 Type 2 diabetes mellitus with other specified complication: Secondary | ICD-10-CM | POA: Diagnosis not present

## 2013-01-12 DIAGNOSIS — J45909 Unspecified asthma, uncomplicated: Secondary | ICD-10-CM | POA: Insufficient documentation

## 2013-01-12 DIAGNOSIS — L97809 Non-pressure chronic ulcer of other part of unspecified lower leg with unspecified severity: Secondary | ICD-10-CM | POA: Insufficient documentation

## 2013-01-12 MED ORDER — CEPHALEXIN 500 MG PO CAPS: ORAL_CAPSULE | ORAL | Status: DC

## 2013-01-12 MED ORDER — SULFAMETHOXAZOLE-TRIMETHOPRIM 800-160 MG PO TABS: 1.0000 | ORAL_TABLET | Freq: Two times a day (BID) | ORAL | Status: DC

## 2013-01-12 NOTE — ED Provider Notes (Signed)
History     CSN: [id number]  Arrival date & time 01/12/13  0156   First MD Initiated Contact with Patient 01/12/13 [phone]      Chief Complaint  Patient presents with  . 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 He is no vomiting no altered mental status his blood sugar was 137 tonight is no chest pain no shortness of breath no other concerns and no treatment prior to arrival. He did have a long-standing history of medication noncompliance of his diabetes however recently he has been taking insulin with much improved blood sugars and for the last several weeks his blood sugars are running anywhere from 100 to less than 250 with no blood sugars greater than 250 which is an improvement over the prior several months. Past Medical History  Diagnosis Date  . Diabetes mellitus   . GERD (gastroesophageal reflux disease)   . Asthma   . Hx MRSA infection     on face    Past Surgical History  Procedure Laterality Date  . Tonsillectomy      Family History  Problem Relation Age of Onset  . Diabetes Father   . Asthma      fhx  . Hypertension      fhx  . Stroke      fhx    History  Substance Use Topics  . Smoking status: Current Every Day Smoker    Types: Cigars  . Smokeless tobacco: Never Used     Comment: 2 per day  . Alcohol Use: No      Review of Systems 10 Systems reviewed and are negative for acute change except as noted in the HPI. Allergies  Cefuroxime axetil; Penicillins; Tessalon; and Shellfish allergy  Home Medications   Current Outpatient Rx  Name  Route  Sig  Dispense  Refill   . albuterol (PROVENTIL HFA;VENTOLIN HFA) 108 (90 BASE) MCG/ACT inhaler   Inhalation   Inhale 2 puffs into the lungs every 6 (six) hours as needed. For shortness of breath         . cephALEXin (KEFLEX) 500 MG capsule      2 caps po bid x 7 days   28 capsule   0   . clindamycin (CLEOCIN) 300 MG capsule   Oral   Take 1 capsule (300 mg total) by mouth 3 (three) times daily. X 7 days   30 capsule   0   . flunisolide (AEROBID) 250 MCG/ACT inhaler   Inhalation   Inhale 2 puffs into the lungs 2 (two) times daily as needed. For shortness of breath         . HYDROcodone-acetaminophen (NORCO/VICODIN) 5-325 MG per tablet   Oral   Take 1-2 tablets by mouth every 6 (six) hours as needed for pain.   17 tablet   0   . Ibuprofen (ADVIL MIGRAINE) 200 MG CAPS   Oral   Take 2 capsules by mouth once as needed. For headache         . ibuprofen (ADVIL,MOTRIN) 800 MG tablet   Oral   Take 800 mg by mouth every 8 (eight) hours as needed. For pain         .  insulin aspart (NOVOLOG FLEXPEN) 100 UNIT/ML injection      Three times daily per sliding scale (1 unit for every 50 of glucose)   1 vial   12   . insulin glargine (LANTUS) 100 UNIT/ML injection   Subcutaneous   Inject 32 Units into the skin at bedtime.          . metoCLOPramide (REGLAN) 10 MG tablet   Oral   Take 10 mg by mouth 4 (four) times daily as needed. To block bile duct         . Omeprazole-Sodium Bicarbonate (ZEGERID OTC) 20-1100 MG CAPS   Oral   Take 1 capsule by mouth daily.         . ondansetron (ZOFRAN) 4 MG tablet   Oral   Take 2 tablets (8 mg total) by mouth every 6 (six) hours.   12 tablet   0   . sulfamethoxazole-trimethoprim (BACTRIM DS,SEPTRA DS) 800-160 MG per tablet   Oral   Take 1 tablet by mouth 2 (two) times daily. One po bid x 7 days   14 tablet   0   . EXPIRED: zolpidem (AMBIEN) 10 MG tablet   Oral   Take 1 tablet (10 mg total) by mouth at bedtime as needed for sleep.   30 tablet    0     BP 119/82  Pulse 107  Temp(Src) 98 F (36.7 C) (Oral)  Resp 12  Ht 5\' 8"  (1.727 m)  Wt 157 lb (71.215 kg)  BMI 23.88 kg/m2  SpO2 100%  Physical Exam  Nursing note and vitals reviewed. Constitutional:  Awake, alert, nontoxic appearance.  HENT:  Head: Atraumatic.  Eyes: Right eye exhibits no discharge. Left eye exhibits no discharge.  Neck: Neck supple.  Cardiovascular: Normal rate and regular rhythm.   No murmur heard. Pulmonary/Chest: Effort normal and breath sounds normal. No respiratory distress. He has no wheezes. He has no rales. He exhibits no tenderness.  Abdominal: Soft. Bowel sounds are normal. He exhibits no distension and no mass. There is no tenderness. There is no rebound and no guarding.  Musculoskeletal: He exhibits no edema and no tenderness.  Baseline ROM, no obvious new focal weakness. Baseline neuropathy in both legs and feet, both legs have multiple ulcers which are chronic, both feet have dorsalis pedis pulses intact with capillary refill less than 2 seconds baseline numbness to both feet and lower legs baseline good strength and movement to both feet and lower legs his right anterior lower leg has a 1 cm spontaneously draining abscess without surrounding cellulitis however since he is a diabetic he will be placed on antibiotics  Neurological: He is alert.  Mental status and motor strength appears baseline for patient and situation.  Skin: No rash noted.  Psychiatric: He has a normal mood and affect.    ED Course  Procedures (including critical care time) Patient / Family / Caregiver informed of clinical course, understand medical decision-making process, and agree with plan. Labs Reviewed - No data to display No results found.   1. Abscess of right lower leg   2. Diabetes mellitus       MDM  I doubt any other EMC precluding discharge at this time including, but not necessarily limited to the following: Necrotizing fasciitis,  sepsis.        Hurman Horn, MD 01/19/13 [phone]

## 2013-01-12 NOTE — ED Notes (Signed)
Pt presents with multiple leg sores. Pt is a diabetic and has been treated for the same here recently.

## 2013-01-28 ENCOUNTER — Emergency Department (HOSPITAL_BASED_OUTPATIENT_CLINIC_OR_DEPARTMENT_OTHER): Payer: Medicaid Other

## 2013-01-28 ENCOUNTER — Inpatient Hospital Stay (HOSPITAL_BASED_OUTPATIENT_CLINIC_OR_DEPARTMENT_OTHER)
Admission: EM | Admit: 2013-01-28 | Discharge: 2013-01-30 | DRG: 074 | Disposition: A | Discharge status: Home / Self Care | Payer: Medicaid Other | Attending: Internal Medicine | Admitting: Internal Medicine

## 2013-01-28 ENCOUNTER — Encounter (HOSPITAL_BASED_OUTPATIENT_CLINIC_OR_DEPARTMENT_OTHER): Payer: Self-pay | Admitting: *Deleted

## 2013-01-28 DIAGNOSIS — Z9119 Patient's noncompliance with other medical treatment and regimen: Secondary | ICD-10-CM | POA: Diagnosis not present

## 2013-01-28 DIAGNOSIS — R739 Hyperglycemia, unspecified: Secondary | ICD-10-CM

## 2013-01-28 DIAGNOSIS — E1100 Type 2 diabetes mellitus with hyperosmolarity without nonketotic hyperglycemic-hyperosmolar coma (NKHHC): Secondary | ICD-10-CM | POA: Diagnosis present

## 2013-01-28 DIAGNOSIS — Z794 Long term (current) use of insulin: Secondary | ICD-10-CM | POA: Diagnosis not present

## 2013-01-28 DIAGNOSIS — Z91148 Patient's other noncompliance with medication regimen for other reason: Secondary | ICD-10-CM

## 2013-01-28 DIAGNOSIS — L02619 Cutaneous abscess of unspecified foot: Secondary | ICD-10-CM | POA: Diagnosis present

## 2013-01-28 DIAGNOSIS — F17200 Nicotine dependence, unspecified, uncomplicated: Secondary | ICD-10-CM | POA: Diagnosis present

## 2013-01-28 DIAGNOSIS — S90129A Contusion of unspecified lesser toe(s) without damage to nail, initial encounter: Secondary | ICD-10-CM

## 2013-01-28 DIAGNOSIS — E871 Hypo-osmolality and hyponatremia: Secondary | ICD-10-CM | POA: Diagnosis present

## 2013-01-28 DIAGNOSIS — E1040 Type 1 diabetes mellitus with diabetic neuropathy, unspecified: Secondary | ICD-10-CM

## 2013-01-28 DIAGNOSIS — IMO0002 Reserved for concepts with insufficient information to code with codable children: Secondary | ICD-10-CM | POA: Diagnosis present

## 2013-01-28 DIAGNOSIS — L03211 Cellulitis of face: Secondary | ICD-10-CM

## 2013-01-28 DIAGNOSIS — E1049 Type 1 diabetes mellitus with other diabetic neurological complication: Secondary | ICD-10-CM | POA: Diagnosis present

## 2013-01-28 DIAGNOSIS — Z9111 Patient's noncompliance with dietary regimen: Secondary | ICD-10-CM

## 2013-01-28 DIAGNOSIS — K219 Gastro-esophageal reflux disease without esophagitis: Secondary | ICD-10-CM | POA: Diagnosis present

## 2013-01-28 DIAGNOSIS — E1065 Type 1 diabetes mellitus with hyperglycemia: Secondary | ICD-10-CM | POA: Diagnosis present

## 2013-01-28 DIAGNOSIS — J069 Acute upper respiratory infection, unspecified: Secondary | ICD-10-CM

## 2013-01-28 DIAGNOSIS — R7309 Other abnormal glucose: Secondary | ICD-10-CM | POA: Diagnosis present

## 2013-01-28 DIAGNOSIS — Z91199 Patient's noncompliance with other medical treatment and regimen due to unspecified reason: Secondary | ICD-10-CM

## 2013-01-28 DIAGNOSIS — S90121A Contusion of right lesser toe(s) without damage to nail, initial encounter: Secondary | ICD-10-CM

## 2013-01-28 DIAGNOSIS — L0390 Cellulitis, unspecified: Secondary | ICD-10-CM | POA: Diagnosis present

## 2013-01-28 DIAGNOSIS — E1142 Type 2 diabetes mellitus with diabetic polyneuropathy: Secondary | ICD-10-CM | POA: Diagnosis present

## 2013-01-28 DIAGNOSIS — R112 Nausea with vomiting, unspecified: Secondary | ICD-10-CM | POA: Diagnosis present

## 2013-01-28 DIAGNOSIS — L03039 Cellulitis of unspecified toe: Secondary | ICD-10-CM | POA: Diagnosis present

## 2013-01-28 DIAGNOSIS — A4902 Methicillin resistant Staphylococcus aureus infection, unspecified site: Secondary | ICD-10-CM | POA: Diagnosis present

## 2013-01-28 DIAGNOSIS — S43499A Other sprain of unspecified shoulder joint, initial encounter: Secondary | ICD-10-CM

## 2013-01-28 DIAGNOSIS — Z720 Tobacco use: Secondary | ICD-10-CM | POA: Diagnosis present

## 2013-01-28 DIAGNOSIS — J45909 Unspecified asthma, uncomplicated: Secondary | ICD-10-CM

## 2013-01-28 DIAGNOSIS — E1069 Type 1 diabetes mellitus with other specified complication: Secondary | ICD-10-CM | POA: Diagnosis present

## 2013-01-28 LAB — COMPREHENSIVE METABOLIC PANEL
BUN: 14 mg/dL (ref 6–23)
CO2: 25 mEq/L (ref 19–32)
Calcium: 9.6 mg/dL (ref 8.4–10.5)
Creatinine, Ser: 1 mg/dL (ref 0.50–1.35)
GFR calc Af Amer: >90 mL/min (ref >90)
GFR calc non Af Amer: >90 mL/min (ref >90)
Glucose, Bld: 817 mg/dL (ref 70–99)

## 2013-01-28 LAB — GLUCOSE, CAPILLARY
Glucose-Capillary: 452 mg/dL — ABNORMAL HIGH (ref 70–99)
Glucose-Capillary: 331 mg/dL — ABNORMAL HIGH (ref 70–99)

## 2013-01-28 LAB — CBC WITH DIFFERENTIAL/PLATELET
Eosinophils Absolute: 0.4 10*3/uL (ref 0.0–0.7)
Eosinophils Relative: 5 % (ref 0–5)
HCT: 35.3 % — ABNORMAL LOW (ref 39.0–52.0)
Lymphocytes Relative: 29 % (ref 12–46)
Lymphs Abs: 2.5 10*3/uL (ref 0.7–4.0)
MCH: 27.8 pg (ref 26.0–34.0)
MCV: 81 fL (ref 78.0–100.0)
Monocytes Absolute: 0.9 10*3/uL (ref 0.1–1.0)
RBC: 4.36 MIL/uL (ref 4.22–5.81)
WBC: 8.4 10*3/uL (ref 4.0–10.5)

## 2013-01-28 MED ORDER — INSULIN REGULAR HUMAN 100 UNIT/ML IJ SOLN
INTRAMUSCULAR | Status: AC
  Administered 2013-01-28: 10 [IU] via SUBCUTANEOUS
  Filled 2013-01-28: qty 1

## 2013-01-28 MED ORDER — SODIUM CHLORIDE 0.9 % IV SOLN: 1000.0000 mL | INTRAVENOUS | Status: DC

## 2013-01-28 MED ORDER — SODIUM CHLORIDE 0.9 % IV SOLN
INTRAVENOUS | Status: DC
  Administered 2013-01-28: 5.4 [IU]/h via INTRAVENOUS

## 2013-01-28 MED ORDER — SODIUM CHLORIDE 0.9 % IV BOLUS (SEPSIS)
1000.0000 mL | Freq: Once | INTRAVENOUS | Status: AC
  Administered 2013-01-28: 1000 mL via INTRAVENOUS

## 2013-01-28 MED ORDER — INSULIN REGULAR HUMAN 100 UNIT/ML IJ SOLN
10.0000 [IU] | Freq: Once | INTRAMUSCULAR | Status: AC
  Administered 2013-01-28: 10 [IU] via SUBCUTANEOUS

## 2013-01-28 MED ORDER — HYDROCODONE-ACETAMINOPHEN 5-325 MG PO TABS
1.0000 | ORAL_TABLET | Freq: Once | ORAL | Status: AC
  Administered 2013-01-28: 1 via ORAL
  Filled 2013-01-28: qty 1

## 2013-01-28 MED ORDER — SODIUM CHLORIDE 0.9 % IV SOLN
1000.0000 mL | Freq: Once | INTRAVENOUS | Status: AC
  Administered 2013-01-28: 1000 mL via INTRAVENOUS

## 2013-01-28 NOTE — ED Notes (Signed)
MD at bedside. 

## 2013-01-28 NOTE — ED Provider Notes (Signed)
History     CSN: [id number]  Arrival date & time 01/28/13  1804   First MD Initiated Contact with Patient 01/28/13 2006      Chief Complaint  Patient presents with  . Toe Pain  . Hyperglycemia    (Consider location/radiation/quality/duration/timing/severity/associated sxs/prior treatment) Patient is a 27 y.o. male presenting with toe pain. The history is provided by the patient and medical records. No language interpreter was used.  Toe Pain Chronicity: Patient is a 27 year old man with insulin-dependent diabetes. He took off his sock, and noticed swelling and ecchymosis of the distal phalanx of his right third toe. He therefore sought evaluation. He has not taken insulin because of vomiting today.  The current episode started 3 to 5 hours ago. The problem occurs constantly. The problem has not changed since onset.Associated symptoms comments: Nausea and vomiting.. Nothing aggravates the symptoms. Nothing relieves the symptoms. He has tried nothing for the symptoms.    Past Medical History  Diagnosis Date  . Diabetes mellitus   . GERD (gastroesophageal reflux disease)   . Asthma   . Hx MRSA infection     on face    Past Surgical History  Procedure Laterality Date  . Tonsillectomy      Family History  Problem Relation Age of Onset  . Diabetes Father   . Asthma      fhx  . Hypertension      fhx  . Stroke      fhx    History  Substance Use Topics  . Smoking status: Current Every Day Smoker    Types: Cigars  . Smokeless tobacco: Never Used     Comment: 2 per day  . Alcohol Use: No      Review of Systems  Constitutional: Negative for fever, chills and unexpected weight change.  HENT: Negative.   Eyes: Negative.   Respiratory: Negative.   Cardiovascular: Negative.   Gastrointestinal: Positive for nausea and vomiting.  Genitourinary: Negative.  Negative for difficulty urinating.  Musculoskeletal:       Ecchymosis and swelling of the distal phalanx of the  right third toe.  Skin:       Ulceration of both lower legs, chronic.  Neurological: Negative.   Psychiatric/Behavioral: Negative.     Allergies  Cefuroxime axetil; Penicillins; Tessalon; and Shellfish allergy  Home Medications   Current Outpatient Rx  Name  Route  Sig  Dispense  Refill  . albuterol (PROVENTIL HFA;VENTOLIN HFA) 108 (90 BASE) MCG/ACT inhaler   Inhalation   Inhale 2 puffs into the lungs every 6 (six) hours as needed. For shortness of breath         . cephALEXin (KEFLEX) 500 MG capsule      2 caps po bid x 7 days   28 capsule   0   . clindamycin (CLEOCIN) 300 MG capsule   Oral   Take 1 capsule (300 mg total) by mouth 3 (three) times daily. X 7 days   30 capsule   0   . flunisolide (AEROBID) 250 MCG/ACT inhaler   Inhalation   Inhale 2 puffs into the lungs 2 (two) times daily as needed. For shortness of breath         . HYDROcodone-acetaminophen (NORCO/VICODIN) 5-325 MG per tablet   Oral   Take 1-2 tablets by mouth every 6 (six) hours as needed for pain.   17 tablet   0   . Ibuprofen (ADVIL MIGRAINE) 200 MG CAPS   Oral   Take  2 capsules by mouth once as needed. For headache         . ibuprofen (ADVIL,MOTRIN) 800 MG tablet   Oral   Take 800 mg by mouth every 8 (eight) hours as needed. For pain         . insulin aspart (NOVOLOG FLEXPEN) 100 UNIT/ML injection      Three times daily per sliding scale (1 unit for every 50 of glucose)   1 vial   12   . insulin glargine (LANTUS) 100 UNIT/ML injection   Subcutaneous   Inject 32 Units into the skin at bedtime.          . metoCLOPramide (REGLAN) 10 MG tablet   Oral   Take 10 mg by mouth 4 (four) times daily as needed. To block bile duct         . Omeprazole-Sodium Bicarbonate (ZEGERID OTC) 20-1100 MG CAPS   Oral   Take 1 capsule by mouth daily.         . ondansetron (ZOFRAN) 4 MG tablet   Oral   Take 2 tablets (8 mg total) by mouth every 6 (six) hours.   12 tablet   0   .  sulfamethoxazole-trimethoprim (BACTRIM DS,SEPTRA DS) 800-160 MG per tablet   Oral   Take 1 tablet by mouth 2 (two) times daily. One po bid x 7 days   14 tablet   0   . EXPIRED: zolpidem (AMBIEN) 10 MG tablet   Oral   Take 1 tablet (10 mg total) by mouth at bedtime as needed for sleep.   30 tablet   0     BP 131/82  Pulse 110  Temp(Src) 99.3 F (37.4 C) (Oral)  Resp 16  Ht 5\' 8"  (1.727 m)  Wt 153 lb (69.4 kg)  BMI 23.27 kg/m2  SpO2 100%  Physical Exam  Constitutional: He is oriented to person, place, and time. He appears well-developed and well-nourished. No distress.  HENT:  Head: Normocephalic and atraumatic.  Right Ear: External ear normal.  Left Ear: External ear normal.  Mouth/Throat: Oropharynx is clear and moist.  Eyes: Conjunctivae and EOM are normal. Pupils are equal, round, and reactive to light.  Neck: Normal range of motion. Neck supple.  Cardiovascular: Normal rate, regular rhythm and normal heart sounds.   Pulmonary/Chest: Effort normal and breath sounds normal.  Abdominal: Soft. Bowel sounds are normal.  Musculoskeletal:  He has ecchymosis and swelling of the distal phalanx of the right third toe. There is no skin necrosis. His right foot has decreased sensation, which is chronic with him.  Neurological: He is alert and oriented to person, place, and time.  Neuropathy in his feet.  Skin:  Ulcerations of both lower legs.  There is a 1 cm diameter healing ulcer on the medial side of the right lower leg, and three healed ulcerations of the lateral side of the left lower leg up near the knee joint.  Psychiatric: He has a normal mood and affect. His behavior is normal.    ED Course  CRITICAL CARE Performed by: Osvaldo Human Authorized by: Osvaldo Human Total critical care time: 30 minutes Critical care was necessary to treat or prevent imminent or life-threatening deterioration of the following conditions: Evaluation and treatment of poorly  controlled diabetic with blood glucose 817, requiring IV insulin via glucostabilizer, admission to stepdown unit. Critical care was time spent personally by me on the following activities: discussions with consultants, evaluation of patient's response to treatment,  examination of patient, obtaining history from patient or surrogate, ordering and performing treatments and interventions, ordering and review of laboratory studies, ordering and review of radiographic studies and re-evaluation of patient's condition.   (including critical care time)   8:28 PM Pt was seen and had physical examination.  Lab tests were ordered.  IV fluids were ordered.  X-rays of the right foot were ordered.  Old charts were reviewed.  He has poorly controlled diabetes with multiple prior admissions for DKA.    9:01 PM Results for orders placed during the hospital encounter of 01/28/13  GLUCOSE, CAPILLARY      Result Value Range   Glucose-Capillary >600 (*) 70 - 99 mg/dL   Comment 1 Notify RN     Comment 2 Documented in Chart    CBC WITH DIFFERENTIAL      Result Value Range   WBC 8.4  4.0 - 10.5 K/uL   RBC 4.36  4.22 - 5.81 MIL/uL   Hemoglobin 12.1 (*) 13.0 - 17.0 g/dL   HCT 16.1 (*) 09.6 - 04.5 %   MCV 81.0  78.0 - 100.0 fL   MCH 27.8  26.0 - 34.0 pg   MCHC 34.3  30.0 - 36.0 g/dL   RDW 40.9  81.1 - 91.4 %   Platelets 424 (*) 150 - 400 K/uL   Neutrophils Relative 54  43 - 77 %   Neutro Abs 4.6  1.7 - 7.7 K/uL   Lymphocytes Relative 29  12 - 46 %   Lymphs Abs 2.5  0.7 - 4.0 K/uL   Monocytes Relative 10  3 - 12 %   Monocytes Absolute 0.9  0.1 - 1.0 K/uL   Eosinophils Relative 5  0 - 5 %   Eosinophils Absolute 0.4  0.0 - 0.7 K/uL   Basophils Relative 1  0 - 1 %   Basophils Absolute 0.1  0.0 - 0.1 K/uL  COMPREHENSIVE METABOLIC PANEL      Result Value Range   Sodium 125 (*) 135 - 145 mEq/L   Potassium 4.8  3.5 - 5.1 mEq/L   Chloride 84 (*) 96 - 112 mEq/L   CO2 25  19 - 32 mEq/L   Glucose, Bld 817 (*) 70  - 99 mg/dL   BUN 14  6 - 23 mg/dL   Creatinine, Ser 7.82  0.50 - 1.35 mg/dL   Calcium 9.6  8.4 - 95.6 mg/dL   Total Protein 8.6 (*) 6.0 - 8.3 g/dL   Albumin 3.7  3.5 - 5.2 g/dL   AST 11  0 - 37 U/L   ALT 24  0 - 53 U/L   Alkaline Phosphatase 157 (*) 39 - 117 U/L   Total Bilirubin 0.7  0.3 - 1.2 mg/dL   GFR calc non Af Amer >90  >90 mL/min   GFR calc Af Amer >90  >90 mL/min   Dg Foot Complete Right  01/28/2013  *RADIOLOGY REPORT*  Clinical Data: Right third to do ecchymosis.  RIGHT FOOT COMPLETE - 3+ VIEW  Comparison: None available.  Findings: No acute bone or soft tissue abnormalities are present. Small vessel calcifications are compatible with diabetes.  No significant osseous abnormality is evident within the third digit.  IMPRESSION:  1.  No acute abnormality. 2.  Small vessel calcifications compatible with diabetes.   Original Report Authenticated By: Marin Roberts, M.D.     9:01 PM Lab tests showed Glucose very high at 817, with CO2 25, so not in DKA.  X-ray of the right foot showed no toe fracture or osteomyelitis.  Rx with IV fluids, call to [facility] to admit pt.  9:18 PM Case discussed with Dr. Toniann Fail, who accepts pt for transfer to [facility] to a stepdown bed, [facility] Team 10.      1. Hyperglycemia   2. Traumatic ecchymosis of toe, right, initial encounter            Carleene Cooper III, MD 01/28/13 2124

## 2013-01-28 NOTE — ED Notes (Signed)
rec'd call from lab, glucose 817, MD aware.

## 2013-01-28 NOTE — ED Notes (Addendum)
Pt reports discoloration ( black ) to right 3 rd toe for unknown amt of time, pt is diabetic

## 2013-01-28 NOTE — ED Notes (Signed)
[facility] link here for transport now 

## 2013-01-28 NOTE — ED Notes (Addendum)
Pt states had seizure activity 2 days ago and treated in home by EMS, did not seek further care at that time.  Previous seizure 2 years ago.  Pt has been nauseous and vomiting since yesterday.  Did not take insulin today because he has not been able to eat anything.  + diarrhea.  Pt also c/o diabetic neuropathy with pains down his lower back to legs and right 3rd toe pain and discoloration.

## 2013-01-29 ENCOUNTER — Inpatient Hospital Stay (HOSPITAL_COMMUNITY): Payer: Medicaid Other

## 2013-01-29 ENCOUNTER — Encounter (HOSPITAL_COMMUNITY): Payer: Self-pay | Admitting: Internal Medicine

## 2013-01-29 DIAGNOSIS — Z9114 Patient's other noncompliance with medication regimen: Secondary | ICD-10-CM

## 2013-01-29 DIAGNOSIS — S90129A Contusion of unspecified lesser toe(s) without damage to nail, initial encounter: Secondary | ICD-10-CM

## 2013-01-29 DIAGNOSIS — L0201 Cutaneous abscess of face: Secondary | ICD-10-CM

## 2013-01-29 DIAGNOSIS — I639 Cerebral infarction, unspecified: Secondary | ICD-10-CM

## 2013-01-29 DIAGNOSIS — E1100 Type 2 diabetes mellitus with hyperosmolarity without nonketotic hyperglycemic-hyperosmolar coma (NKHHC): Secondary | ICD-10-CM | POA: Diagnosis present

## 2013-01-29 DIAGNOSIS — L0390 Cellulitis, unspecified: Secondary | ICD-10-CM | POA: Diagnosis present

## 2013-01-29 DIAGNOSIS — R112 Nausea with vomiting, unspecified: Secondary | ICD-10-CM

## 2013-01-29 DIAGNOSIS — E1101 Type 2 diabetes mellitus with hyperosmolarity with coma: Secondary | ICD-10-CM

## 2013-01-29 DIAGNOSIS — F17200 Nicotine dependence, unspecified, uncomplicated: Secondary | ICD-10-CM

## 2013-01-29 DIAGNOSIS — L03211 Cellulitis of face: Secondary | ICD-10-CM

## 2013-01-29 DIAGNOSIS — Z720 Tobacco use: Secondary | ICD-10-CM | POA: Diagnosis present

## 2013-01-29 DIAGNOSIS — Z9119 Patient's noncompliance with other medical treatment and regimen: Secondary | ICD-10-CM

## 2013-01-29 DIAGNOSIS — R7309 Other abnormal glucose: Secondary | ICD-10-CM

## 2013-01-29 HISTORY — DX: Cerebral infarction, unspecified: I63.9

## 2013-01-29 LAB — CBC
Hemoglobin: 10.8 g/dL — ABNORMAL LOW (ref 13.0–17.0)
MCH: 26.6 pg (ref 26.0–34.0)
Platelets: 385 10*3/uL (ref 150–400)
RBC: 4.06 MIL/uL — ABNORMAL LOW (ref 4.22–5.81)
WBC: 9.4 10*3/uL (ref 4.0–10.5)

## 2013-01-29 LAB — GLUCOSE, CAPILLARY
Glucose-Capillary: 160 mg/dL — ABNORMAL HIGH (ref 70–99)
Glucose-Capillary: 201 mg/dL — ABNORMAL HIGH (ref 70–99)
Glucose-Capillary: 211 mg/dL — ABNORMAL HIGH (ref 70–99)
Glucose-Capillary: 273 mg/dL — ABNORMAL HIGH (ref 70–99)
Glucose-Capillary: 83 mg/dL (ref 70–99)

## 2013-01-29 LAB — BASIC METABOLIC PANEL
Calcium: 8.9 mg/dL (ref 8.4–10.5)
GFR calc non Af Amer: >90 mL/min (ref >90)
Glucose, Bld: 196 mg/dL — ABNORMAL HIGH (ref 70–99)
Potassium: 3.5 mEq/L (ref 3.5–5.1)
Sodium: 136 mEq/L (ref 135–145)

## 2013-01-29 LAB — URINALYSIS, ROUTINE W REFLEX MICROSCOPIC
Ketones, ur: 15 mg/dL — AB
Leukocytes, UA: NEGATIVE
Nitrite: NEGATIVE
Protein, ur: 30 mg/dL — AB
Urobilinogen, UA: 0.2 mg/dL (ref 0.0–1.0)

## 2013-01-29 LAB — URINE MICROSCOPIC-ADD ON

## 2013-01-29 LAB — HEMOGLOBIN A1C
Hgb A1c MFr Bld: 11.3 % — ABNORMAL HIGH (ref <5.7)
Mean Plasma Glucose: 278 mg/dL — ABNORMAL HIGH (ref <117)

## 2013-01-29 MED ORDER — OXYCODONE HCL 5 MG PO TABS
5.0000 mg | ORAL_TABLET | Freq: Three times a day (TID) | ORAL | Status: DC | PRN
  Administered 2013-01-29: 5 mg via ORAL
  Filled 2013-01-29: qty 1

## 2013-01-29 MED ORDER — SODIUM CHLORIDE 0.9 % IJ SOLN
3.0000 mL | Freq: Two times a day (BID) | INTRAMUSCULAR | Status: DC
  Administered 2013-01-29 – 2013-01-30 (×2): 3 mL via INTRAVENOUS

## 2013-01-29 MED ORDER — INSULIN ASPART 100 UNIT/ML ~~LOC~~ SOLN
0.0000 [IU] | Freq: Three times a day (TID) | SUBCUTANEOUS | Status: DC
  Administered 2013-01-29: 5 [IU] via SUBCUTANEOUS
  Administered 2013-01-29: 8 [IU] via SUBCUTANEOUS

## 2013-01-29 MED ORDER — MUPIROCIN CALCIUM 2 % EX CREA
TOPICAL_CREAM | Freq: Every day | CUTANEOUS | Status: DC
  Filled 2013-01-29: qty 15

## 2013-01-29 MED ORDER — VANCOMYCIN HCL IN DEXTROSE 750-5 MG/150ML-% IV SOLN
750.0000 mg | Freq: Two times a day (BID) | INTRAVENOUS | Status: DC
  Administered 2013-01-29 – 2013-01-30 (×2): 750 mg via INTRAVENOUS
  Filled 2013-01-29 (×4): qty 150

## 2013-01-29 MED ORDER — INSULIN ASPART 100 UNIT/ML ~~LOC~~ SOLN: 0.0000 [IU] | Freq: Every day | SUBCUTANEOUS | Status: DC

## 2013-01-29 MED ORDER — GADOBENATE DIMEGLUMINE 529 MG/ML IV SOLN
10.0000 mL | Freq: Once | INTRAVENOUS | Status: AC
  Administered 2013-01-29: 10 mL via INTRAVENOUS

## 2013-01-29 MED ORDER — ONDANSETRON HCL 4 MG PO TABS: 4.0000 mg | ORAL_TABLET | Freq: Four times a day (QID) | ORAL | Status: DC | PRN

## 2013-01-29 MED ORDER — PREGABALIN 25 MG PO CAPS: 75.0000 mg | ORAL_CAPSULE | Freq: Every day | ORAL | Status: DC

## 2013-01-29 MED ORDER — SODIUM CHLORIDE 0.9 % IV SOLN
1000.0000 mL | INTRAVENOUS | Status: DC
  Administered 2013-01-29: 1000 mL via INTRAVENOUS

## 2013-01-29 MED ORDER — HYDROCODONE-ACETAMINOPHEN 5-325 MG PO TABS
1.0000 | ORAL_TABLET | Freq: Four times a day (QID) | ORAL | Status: DC | PRN
  Administered 2013-01-29 – 2013-01-30 (×3): 2 via ORAL
  Filled 2013-01-29 (×3): qty 2

## 2013-01-29 MED ORDER — GLUCERNA SHAKE PO LIQD
237.0000 mL | Freq: Every day | ORAL | Status: DC
  Administered 2013-01-29: 237 mL via ORAL

## 2013-01-29 MED ORDER — VANCOMYCIN HCL IN DEXTROSE 1-5 GM/200ML-% IV SOLN
1000.0000 mg | Freq: Once | INTRAVENOUS | Status: AC
  Administered 2013-01-29: 1000 mg via INTRAVENOUS
  Filled 2013-01-29: qty 200

## 2013-01-29 MED ORDER — ENOXAPARIN SODIUM 40 MG/0.4ML ~~LOC~~ SOLN
40.0000 mg | SUBCUTANEOUS | Status: DC
  Administered 2013-01-29: 40 mg via SUBCUTANEOUS
  Filled 2013-01-29 (×3): qty 0.4

## 2013-01-29 MED ORDER — CHLORHEXIDINE GLUCONATE CLOTH 2 % EX PADS
6.0000 | MEDICATED_PAD | Freq: Every day | CUTANEOUS | Status: DC
  Administered 2013-01-29 – 2013-01-30 (×2): 6 via TOPICAL

## 2013-01-29 MED ORDER — PREGABALIN 75 MG PO CAPS
75.0000 mg | ORAL_CAPSULE | Freq: Three times a day (TID) | ORAL | Status: DC
  Administered 2013-01-29 – 2013-01-30 (×4): 75 mg via ORAL
  Filled 2013-01-29 (×2): qty 3
  Filled 2013-01-29 (×2): qty 1

## 2013-01-29 MED ORDER — ACETAMINOPHEN 325 MG PO TABS: 650.0000 mg | ORAL_TABLET | Freq: Four times a day (QID) | ORAL | Status: DC | PRN

## 2013-01-29 MED ORDER — SODIUM CHLORIDE 0.9 % IV SOLN
1000.0000 mL | INTRAVENOUS | Status: AC
  Administered 2013-01-29: 1000 mL via INTRAVENOUS

## 2013-01-29 MED ORDER — PANTOPRAZOLE SODIUM 40 MG PO TBEC
40.0000 mg | DELAYED_RELEASE_TABLET | Freq: Every day | ORAL | Status: DC
  Administered 2013-01-29 – 2013-01-30 (×2): 40 mg via ORAL
  Filled 2013-01-29 (×2): qty 1

## 2013-01-29 MED ORDER — MUPIROCIN 2 % EX OINT
1.0000 "application " | TOPICAL_OINTMENT | Freq: Two times a day (BID) | CUTANEOUS | Status: DC
  Administered 2013-01-29 – 2013-01-30 (×3): 1 via NASAL
  Filled 2013-01-29 (×2): qty 22

## 2013-01-29 MED ORDER — ACETAMINOPHEN 650 MG RE SUPP: 650.0000 mg | Freq: Four times a day (QID) | RECTAL | Status: DC | PRN

## 2013-01-29 MED ORDER — ZOLPIDEM TARTRATE 5 MG PO TABS
10.0000 mg | ORAL_TABLET | Freq: Every evening | ORAL | Status: DC | PRN
  Administered 2013-01-29: 10 mg via ORAL
  Filled 2013-01-29: qty 2

## 2013-01-29 MED ORDER — INSULIN GLARGINE 100 UNIT/ML ~~LOC~~ SOLN
32.0000 [IU] | Freq: Every day | SUBCUTANEOUS | Status: DC
  Administered 2013-01-29 – 2013-01-30 (×2): 32 [IU] via SUBCUTANEOUS
  Filled 2013-01-29 (×3): qty 0.32

## 2013-01-29 MED ORDER — SODIUM CHLORIDE 0.9 % IV SOLN
INTRAVENOUS | Status: DC
  Filled 2013-01-29 (×2): qty 1

## 2013-01-29 MED ORDER — ONDANSETRON HCL 4 MG/2ML IJ SOLN: 4.0000 mg | Freq: Four times a day (QID) | INTRAMUSCULAR | Status: DC | PRN

## 2013-01-29 MED ORDER — OXYCODONE-ACETAMINOPHEN 5-325 MG PO TABS
1.0000 | ORAL_TABLET | Freq: Three times a day (TID) | ORAL | Status: DC | PRN
  Administered 2013-01-29: 1 via ORAL
  Filled 2013-01-29: qty 1

## 2013-01-29 MED ORDER — OXYCODONE-ACETAMINOPHEN 10-325 MG PO TABS: 1.0000 | ORAL_TABLET | Freq: Three times a day (TID) | ORAL | Status: DC | PRN

## 2013-01-29 NOTE — Progress Notes (Signed)
4/24  Spoke with patient about his diabetes.  Was diagnosed with diabetes in 1994 at the age of 42.  Has always been on insulin.  Sees Dr. Clent Ridges with [facility] as PCP.  Last seen during this year.  Had been on an insulin pump at one time, but had problems with being able to afford the supplies.  Has been off pump for 2-3 years.  Takes Lantus 32 units at bedtime and Novolog per sliding scale to correct blood sugar before each meal.  At bedtime, corrects blood sugar if greater than 200 mg/dl.  Patient did not take much insulin the day he was admitted to the hospital.  Has been borrowing Lantus insulin from his cousin since the cousin is now on an insulin pump.  Does get samples of insulin when visiting Dr. Claris Che office. Has tried using Regular insulin and NPH due to cost, but it does not work as well for him. Has applied for Medicaid 3 times, but has been denied because of filling the application out incorrectly.  Has applied for disability.Suggest that care management see patient to perhaps get him a 30 day supply of Lantus and Novolog. I will give him a free trial voucher for Lantus Solostar pens for 30 days and have him apply for the [employer] patient assistance program per application that has to be signed by the physician.  Patient could change to Apidra (manufactured by [employer]) in place of Novolog. Could also apply for patient assistance with blood glucose meter companines.  Spoke with patient about his complications from the long term diabetes. Will continue to follow while in hospital. Consult for care management has been ordered.

## 2013-01-29 NOTE — Consult Note (Signed)
WOC consult Note Reason for Consult: Consult requested for right leg and right toe. Wound type: Right 3rd toe is dark purple and swollen, affected area 1X.5cm.  No open wound or drainage; no topical care indicated at present time.  Right calf with several areas of pink dry scar tissue from previous wounds which have healed.   Right outer calf full thickness wound 1.5X1X.2cm, 100% yellow and dry, small yellow drainage, no odor.  Intact skin surrounding. Dressing procedure/placement/frequency: Pt has been using Bactroban to wound prior to admission to promote healing.  Continue with present plan of care to promote moist healing and provide antimicrobial benefits.   Pt just returned from MRI.  If further assessment and plan of care desired, then please consult ortho team. Please re-consult if further assistance is needed.  Thank-you,  Cammie Mcgee MSN, RN, CWOCN, [place], CNS [phone]

## 2013-01-29 NOTE — Progress Notes (Signed)
4/24  Patient given application for patient assistance with [employer] for Lantus and Apidra. Will require a physician's signature.   Also given a free voucher for 1 box of Lantus Solostar pens that will require a separate prescription just for the 30 day supply Solostar pens and needles.  Care management to follow up. Will continue to follow while in hospital.  Smith Mince RN BSN CDE

## 2013-01-29 NOTE — Progress Notes (Signed)
INITIAL NUTRITION ASSESSMENT  DOCUMENTATION CODES Per approved criteria  -Not Applicable   INTERVENTION:  Glucerna Shake daily (220 kcals, 9.9 gm protein per 8 fl oz bottle) RD to follow for nutrition care plan  NUTRITION DIAGNOSIS: Unintended weight loss related to uncontrolled DM as evidenced by 16% weight loss  Goal: Oral intake with meals & supplements to meet >/= 90% of estimated nutrition needs  Monitor:  PO & supplemental intake, weight, labs, I/O's  Reason for Assessment: Malnutrition Screening Tool Report  27 y.o. male  Admitting Dx: Diabetic hyperosmolar non-ketotic state  ASSESSMENT: Patient with history of DM Type 1 presented to ER in [facility] with complaints of discolored third toe of the right foot; also has been having nausea & vomiting ----> admitted for further management, started on IV insulin infusion with fluids.   RD spoke with patient regarding nutrition hx; states he was experiencing nausea & vomiting x 1 day PTA; usually eats well; PO intake per breakfast tray observation minimal; reports a 25 lb weight loss in "a couple weeks;" amenable to supplements ---> RD to order.  CWOCN note reviewed 4/24 ---> right outer calf full thickness wound present.  Height: Ht Readings from Last 1 Encounters:  01/28/13 5\' 8"  (1.727 m)    Weight: Wt Readings from Last 1 Encounters:  01/28/13 128 lb 4.8 oz (58.196 kg)    Ideal Body Weight: 154 lb  % Ideal Body Weight: 83%  Wt Readings from Last 10 Encounters:  01/28/13 128 lb 4.8 oz (58.196 kg)  01/12/13 157 lb (71.215 kg)  12/09/12 130 lb (58.968 kg)  10/02/12 126 lb (57.153 kg)  12/18/11 138 lb (62.596 kg)  11/27/11 148 lb (67.132 kg)  11/15/11 138 lb (62.596 kg)  08/27/09 152 lb (68.947 kg)  06/09/08 151 lb (68.493 kg)  11/26/07 162 lb (73.483 kg)    Usual Body Weight: 153 lb  % Usual Body Weight: 84%  BMI:  Body mass index is 19.51 kg/(m^2).  Estimated Nutritional Needs: Kcal:  1800-2000 Protein: 90-100 g, Fluid: 1.8-2.0 L  Skin: right outer calf full thickness wound  Diet Order: Carb Control  EDUCATION NEEDS: -No education needs identified at this time   Intake/Output Summary (Last 24 hours) at 01/29/13 1045 Last data filed at 01/29/13 0900  Gross per 24 hour  Intake 3065.11 ml  Output    625 ml  Net 2440.11 ml    Labs:   Recent Labs Lab 01/28/13 1941 01/29/13 0120  NA 125* 136  K 4.8 3.5  CL 84* 98  CO2 25 29  BUN 14 10  CREATININE 1.00 0.86  CALCIUM 9.6 8.9  GLUCOSE 817* 196*    CBG (last 3)   Recent Labs  01/29/13 0148 01/29/13 0244 01/29/13 0731  GLUCAP 160* 143* 273*    Scheduled Meds: . Chlorhexidine Gluconate Cloth  6 each Topical Q0600  . enoxaparin (LOVENOX) injection  40 mg Subcutaneous Q24H  . insulin aspart  0-15 Units Subcutaneous TID WC  . insulin aspart  0-5 Units Subcutaneous QHS  . insulin glargine  32 Units Subcutaneous Daily  . mupirocin cream   Topical Daily  . mupirocin ointment  1 application Nasal BID  . pantoprazole  40 mg Oral Daily  . pregabalin  75 mg Oral TID  . sodium chloride  3 mL Intravenous Q12H  . vancomycin  750 mg Intravenous Q12H    Continuous Infusions: . sodium chloride 1,000 mL (01/29/13 0745)    Past Medical History  Diagnosis Date  . Diabetes mellitus   . GERD (gastroesophageal reflux disease)   . Asthma   . Hx MRSA infection     on face    Past Surgical History  Procedure Laterality Date  . Tonsillectomy      Maureen Chatters, RD, LDN Pager #: [phone] After-Hours Pager #: [phone]

## 2013-01-29 NOTE — Progress Notes (Signed)
  ANTIBIOTIC CONSULT NOTE - INITIAL  Pharmacy Consult for vancomycin  Indication: multiple skin wounds  Allergies  Allergen Reactions  . Cefuroxime Axetil Anaphylaxis  . Penicillins Anaphylaxis  . Tessalon (Benzonatate) Anaphylaxis    Can take amoxicillin  . Shellfish Allergy Itching    Patient Measurements: Height: 5\' 8"  (172.7 cm) Weight: 128 lb 4.8 oz (58.196 kg) IBW/kg (Calculated) : 68.4 Adjusted Body Weight:   Vital Signs: Temp: 98.5 F (36.9 C) (04/23 2310) Temp src: Oral (04/23 2310) BP: 152/92 mmHg (04/23 2208) Pulse Rate: 110 (04/23 2310) Intake/Output from previous day: 04/23 0701 - 04/24 0700 In: 2000 [I.V.:2000] Out: -  Intake/Output from this shift: Total I/O In: 2000 [I.V.:2000] Out: -   Labs:  Recent Labs  01/28/13 1941  WBC 8.4  HGB 12.1*  PLT 424*  CREATININE 1.00   Estimated Creatinine Clearance: 90.5 ml/min (by C-G formula based on Cr of 1). No results found for this basename: VANCOTROUGH, VANCOPEAK, VANCORANDOM, GENTTROUGH, GENTPEAK, GENTRANDOM, TOBRATROUGH, TOBRAPEAK, TOBRARND, AMIKACINPEAK, AMIKACINTROU, AMIKACIN,  in the last 72 hours   Microbiology: No results found for this or any previous visit (from the past 720 hour(s)).  Medical History: Past Medical History  Diagnosis Date  . Diabetes mellitus   . GERD (gastroesophageal reflux disease)   . Asthma   . Hx MRSA infection     on face    Medications:  Prescriptions prior to admission  Medication Sig Dispense Refill  . clindamycin (CLEOCIN) 300 MG capsule Take 300 mg by mouth 3 (three) times daily. 7 day treatment for leg ulcer      . HYDROcodone-acetaminophen (NORCO/VICODIN) 5-325 MG per tablet Take 1-2 tablets by mouth every 6 (six) hours as needed for pain.  17 tablet  0  . insulin aspart (NOVOLOG FLEXPEN) 100 UNIT/ML injection Inject 1-17 Units into the skin 4 (four) times daily -  before meals and at bedtime.      . insulin glargine (LANTUS) 100 UNIT/ML injection Inject  32 Units into the skin at bedtime.       Marland Kitchen oxyCODONE-acetaminophen (PERCOCET) 10-325 MG per tablet Take 1 tablet by mouth every 8 (eight) hours as needed for pain.      . pregabalin (LYRICA) 75 MG capsule Take 75 mg by mouth daily.      Marland Kitchen zolpidem (AMBIEN) 10 MG tablet Take 10 mg by mouth at bedtime as needed for sleep.      Marland Kitchen Omeprazole-Sodium Bicarbonate (ZEGERID OTC) 20-1100 MG CAPS Take 1 capsule by mouth at bedtime.       Marland Kitchen zolpidem (AMBIEN) 10 MG tablet Take 1 tablet (10 mg total) by mouth at bedtime as needed for sleep.  30 tablet  0   Assessment: Wound cellulitis in setting of hyperosmolar non-ketotic diabetic state  Goal of Therapy:  Vancomycin 10-15mcg/ml   Plan:  Vancomycin 1gm now then 750mg  q12h  F/u trough at 4th dose    Janice Coffin 01/29/2013,12:35 AM

## 2013-01-29 NOTE — Progress Notes (Signed)
[facility] Progress Note [place] TEAM 1 - Stepdown/ICU TEAM   Jason [name] [place] VHQ:[id number] DOB: 1986-06-11 DOA: 01/28/2013 PCP: Nelwyn Salisbury, MD  Brief narrative: 27 year old male patient with known diabetes mellitus type 1 diagnosed at age 74. According to his outpatient physician documentation the patient has a history of noncompliance with medications and treatment plans. He presented to the med center [facility] department with complaints of discolored third toe of the right foot. He states he first noticed this on the evening of presentation. He denies any obvious trauma to the area. Patient endorsed chronic peripheral neuropathy and is in for pain. In addition he's been having nausea and vomiting since the same morning and blood sugars are greater than 800 with a mildly elevated anion gap. Plain x-rays of the foot in the emergency department did not reveal any acute problems. He was subsequently admitted to begin an insulin infusion with IV fluids for dehydration as well as IV antibiotics for suspected cellulitis in the toe.   Assessment/Plan: Active Problems:   Type 1 diabetes, uncontrolled, with neuropathy/Diabetic hyperosmolar non-ketotic state -transition insulin drip to Lantus -suspect infection may have precipitated severe hyperglycemia -cont IVF but decrease rate to 75/hr -cont home dose Vicodin but increase Lyrica from daily to TID    Nausea and vomiting -due to hyperglycemia and transient gastroparesis    Traumatic ecchymosis of toe/Wound cellulitis -MRI shows cellulitis at tip of toe without osteomyelitis -since MRSA PCR positive will treat as MRSA infection and cont Vancomycin -since endorsed myalgias/diffuse body pain will obtain blood cultures to r/o bacteremia -results of MRI called to patient and explanation for treatment plan given -also has skin lesions similar to superficial abscesses which also likely could be MRSA    Tobacco abuse   Noncompliance with diet and medication regimen -Diabetes educator consult   DVT prophylaxis: Lovenox Code Status: Full Family Communication: Patient and wife Disposition Plan: Transfer to floor Isolation: Contact isolation for MRSA PCR positive status  Consultants: None  Procedures: None  Antibiotics: Vancomycin 4/24 >>>  HPI/Subjective: Patient alert and endorsing generalized diffuse extremity pain which sounds consistent with myalgias. No chest pain or shortness of breath. Also having some diffuse mid abdominal discomfort not associated with nausea and vomiting.   Objective: Blood pressure 135/84, pulse 105, temperature 99 F (37.2 C), temperature source Oral, resp. rate 11, height 5\' 8"  (1.727 m), weight 58.196 kg (128 lb 4.8 oz), SpO2 100.00%.  Intake/Output Summary (Last 24 hours) at 01/29/13 1056 Last data filed at 01/29/13 0900  Gross per 24 hour  Intake 3065.11 ml  Output    625 ml  Net 2440.11 ml     Exam: General: No acute respiratory distress Lungs: Clear to auscultation bilaterally without wheezes or crackles, RA Cardiovascular: Regular rate and rhythm without murmur gallop or rub normal S1 and S2, no peripheral edema or JVD Abdomen: Nontender, nondistended, soft, bowel sounds positive, no rebound, no ascites, no appreciable mass Musculoskeletal: No significant cyanosis, clubbing of bilateral lower extremities-right third toe discolored without drainage Neurological: Alert and oriented x 3, moves all extremities x 4 without focal neurological deficits, CN 2-12 intact  Data Reviewed: Basic Metabolic Panel:  Recent Labs Lab 01/28/13 1941 01/29/13 0120  NA 125* 136  K 4.8 3.5  CL 84* 98  CO2 25 29  GLUCOSE 817* 196*  BUN 14 10  CREATININE 1.00 0.86  CALCIUM 9.6 8.9   Liver Function Tests:  Recent Labs Lab 01/28/13 1941  AST 11  ALT 24  ALKPHOS 157*  BILITOT 0.7  PROT 8.6*  ALBUMIN 3.7   No results found for this basename: LIPASE,  AMYLASE,  in the last 168 hours No results found for this basename: AMMONIA,  in the last 168 hours CBC:  Recent Labs Lab 01/28/13 1941 01/29/13 0120  WBC 8.4 9.4  NEUTROABS 4.6  --   HGB 12.1* 10.8*  HCT 35.3* 31.4*  MCV 81.0 77.3*  PLT 424* 385   Cardiac Enzymes: No results found for this basename: CKTOTAL, CKMB, CKMBINDEX, TROPONINI,  in the last 168 hours BNP (last 3 results) No results found for this basename: PROBNP,  in the last 8760 hours CBG:  Recent Labs Lab 01/28/13 2307 01/29/13 0043 01/29/13 0148 01/29/13 0244 01/29/13 0731  GLUCAP 331* 201* 160* 143* 273*    Recent Results (from the past 240 hour(s))  MRSA PCR SCREENING     Status: Abnormal   Collection Time    01/28/13 11:26 PM      Result Value Range Status   MRSA by PCR POSITIVE (*) NEGATIVE Final   Comment:            The GeneXpert MRSA Assay (FDA     approved for NASAL specimens     only), is one component of a     comprehensive MRSA colonization     surveillance program. It is not     intended to diagnose MRSA     infection nor to guide or     monitor treatment for     MRSA infections.     RESULT CALLED TO, READ BACK BY AND VERIFIED WITH:     OFORI,E RN [phone] AT 46 SKEENP     Studies:  Recent x-ray studies have been reviewed in detail by the Attending Physician  Scheduled Meds:  Reviewed in detail by the Attending Physician   Junious Silk, ANP [facility] Office  [phone] Pager [phone]  On-Call/Text Page:      [email or web address]      password [email or web address]  If 7PM-7AM, please contact night-coverage [email or web address] Password [email or web address] 01/29/2013, 10:56 AM   LOS: 1 day   I have examined the patient, reviewed the chart and modified the above note which I agree with.   Toyoko Silos,MD [phone] 01/29/2013, 2:56 PM

## 2013-01-29 NOTE — Progress Notes (Signed)
Jason [name] is a 27 y.o. male patient who transferred  From 2600 awake, alert  & orientated  X 3, Full Code, VSS - Blood pressure 144/94, pulse 107, temperature 98.4 F (36.9 C), temperature source Oral, resp. rate 18, height 5\' 8"  (1.727 m), weight 58.196 kg (128 lb 4.8 oz), SpO2 99.00%.,no c/o shortness of breath, no c/o chest pain, no distress noted. No tele  IV site WDL: forearm left, condition patent and no redness with a transparent dsg that's clean dry and intact.  Allergies:   Allergies  Allergen Reactions  . Cefuroxime Axetil Anaphylaxis  . Penicillins Anaphylaxis  . Tessalon (Benzonatate) Anaphylaxis    Can take amoxicillin  . Shellfish Allergy Itching     Past Medical History  Diagnosis Date  . Diabetes mellitus   . GERD (gastroesophageal reflux disease)   . Asthma   . Hx MRSA infection     on face    Pt orientation to unit, room and routine. SR up x 2, fall risk assessment complete with Patient and family verbalizing understanding of risks associated with falls. Pt verbalizes an understanding of how to use the call bell and to call for help before getting out of bed. Pt has multiple healing ulcers on body and RLE ulcer with foam dressing.   Will cont to monitor and assist as needed.  Cindra Eves, RN 01/29/2013 7:39 PM

## 2013-01-29 NOTE — Progress Notes (Signed)
Utilization Review Completed. 01/29/2013  

## 2013-01-29 NOTE — H&P (Signed)
[facility] History and Physical  Jason [name] ZOX:[id number] DOB: [birth year] DOA: 01/28/2013  Referring physician: ER physician Dr. Ignacia Palma. PCP: Nelwyn Salisbury, MD  Specialists: None.  Chief Complaint: Discoloration of the right third toe.  HPI: Jason [name] is a 27 y.o. male with history of diabetes mellitus type 1 presented to the ER in [facility] with complaints of discolored third toe of the right foot. Patient noticed this last evening at home. Denies any trauma. Patient states that he has neuropathy and usually doesn't feel pain in the extremities. In addition patient also has been having nausea vomiting since morning. In the eye was found to have blood sugars more than 800 with slightly increased anion gap. X-rays of the foot does not reveal anything acute. Patient has been admitted for further management. Patient has been started on IV insulin infusion with fluids. Patient has nausea vomiting since morning but denies any abdominal pain or diarrhea. Patient states since he has not eaten anything since morning he did not take his insulin. Denies any chest pain or shortness of breath fever chills. Patient has been having multiple skin wounds. 2 of them on the shoulder and one on his right shin. Patient states that these wounds have been there for a few months and it has discharge. Patient was prescribed antibiotics previously and has not taken it.  Review of Systems: As presented in the history of presenting illness, rest negative.  Past Medical History  Diagnosis Date  . Diabetes mellitus   . GERD (gastroesophageal reflux disease)   . Asthma   . Hx MRSA infection     on face   Past Surgical History  Procedure Laterality Date  . Tonsillectomy     Social History:  reports that he has been smoking Cigars.  He has never used smokeless tobacco. He reports that he does not drink alcohol or use illicit drugs. Lives at home. where does patient live-- Can do  ADLs. Can patient participate in ADLs?  Allergies  Allergen Reactions  . Cefuroxime Axetil Anaphylaxis  . Penicillins Anaphylaxis  . Tessalon (Benzonatate) Anaphylaxis    Can take amoxicillin  . Shellfish Allergy Itching    Family History  Problem Relation Age of Onset  . Diabetes Father   . Asthma      fhx  . Hypertension      fhx  . Stroke      fhx      Prior to Admission medications   Medication Sig Start Date End Date Taking? Authorizing Provider  clindamycin (CLEOCIN) 300 MG capsule Take 300 mg by mouth 3 (three) times daily. 7 day treatment for leg ulcer   Yes Historical Provider, MD  HYDROcodone-acetaminophen (NORCO/VICODIN) 5-325 MG per tablet Take 1-2 tablets by mouth every 6 (six) hours as needed for pain. 10/17/12  Yes John-Adam Bonk, MD  insulin aspart (NOVOLOG FLEXPEN) 100 UNIT/ML injection Inject 1-17 Units into the skin 4 (four) times daily -  before meals and at bedtime.   Yes Historical Provider, MD  insulin glargine (LANTUS) 100 UNIT/ML injection Inject 32 Units into the skin at bedtime.    Yes Historical Provider, MD  oxyCODONE-acetaminophen (PERCOCET) 10-325 MG per tablet Take 1 tablet by mouth every 8 (eight) hours as needed for pain.   Yes Historical Provider, MD  pregabalin (LYRICA) 75 MG capsule Take 75 mg by mouth daily.   Yes Historical Provider, MD  zolpidem (AMBIEN) 10 MG tablet Take 10 mg by mouth at bedtime as  needed for sleep.   Yes Historical Provider, MD  Omeprazole-Sodium Bicarbonate (ZEGERID OTC) 20-1100 MG CAPS Take 1 capsule by mouth at bedtime.     Historical Provider, MD  zolpidem (AMBIEN) 10 MG tablet Take 1 tablet (10 mg total) by mouth at bedtime as needed for sleep. 12/09/12 01/08/13  Nelwyn Salisbury, MD   Physical Exam: Filed Vitals:   01/28/13 1809 01/28/13 2208 01/28/13 2310  BP: 131/82 152/92   Pulse: 110 112 110  Temp: 99.3 F (37.4 C)  98.5 F (36.9 C)  TempSrc: Oral  Oral  Resp: 16 18   Height: 5\' 8"  (1.727 m)  5\' 8"  (1.727 m)   Weight: 69.4 kg (153 lb)  58.196 kg (128 lb 4.8 oz)  SpO2: 100% 100% 100%     General:  Well-developed well-nourished.  Eyes: Anicteric no pallor.  ENT: No discharge from the ears eyes nose and mouth.  Neck: No mass felt.  Cardiovascular: S1-S2 heard tachycardic.  Respiratory: No rhonchi no crepitations.  Abdomen: Soft nontender bowel sounds present.  Skin: Patient has at least 3 wounds. 2 of the more on the posterior aspect of the shoulder measuring around 1/2 cm with mild discharge. And one on the right shin which is an ulcer with no active discharge.  Musculoskeletal: No edema.  Psychiatric: Appears normal with no suicidal ideation.  Neurologic: Moves all extremities.  Labs on Admission:  Basic Metabolic Panel:  Recent Labs Lab 01/28/13 1941  NA 125*  K 4.8  CL 84*  CO2 25  GLUCOSE 817*  BUN 14  CREATININE 1.00  CALCIUM 9.6   Liver Function Tests:  Recent Labs Lab 01/28/13 1941  AST 11  ALT 24  ALKPHOS 157*  BILITOT 0.7  PROT 8.6*  ALBUMIN 3.7   No results found for this basename: LIPASE, AMYLASE,  in the last 168 hours No results found for this basename: AMMONIA,  in the last 168 hours CBC:  Recent Labs Lab 01/28/13 1941  WBC 8.4  NEUTROABS 4.6  HGB 12.1*  HCT 35.3*  MCV 81.0  PLT 424*   Cardiac Enzymes: No results found for this basename: CKTOTAL, CKMB, CKMBINDEX, TROPONINI,  in the last 168 hours  BNP (last 3 results) No results found for this basename: PROBNP,  in the last 8760 hours CBG:  Recent Labs Lab 01/28/13 1811 01/28/13 2212 01/28/13 2307  GLUCAP >600* 452* 331*    Radiological Exams on Admission: Dg Foot Complete Right  01/28/2013  *RADIOLOGY REPORT*  Clinical Data: Right third to do ecchymosis.  RIGHT FOOT COMPLETE - 3+ VIEW  Comparison: None available.  Findings: No acute bone or soft tissue abnormalities are present. Small vessel calcifications are compatible with diabetes.  No significant osseous abnormality is  evident within the third digit.  IMPRESSION:  1.  No acute abnormality. 2.  Small vessel calcifications compatible with diabetes.   Original Report Authenticated By: Marin Roberts, M.D.      Assessment/Plan Principal Problem:   Diabetic hyperosmolar non-ketotic state Active Problems:   Nausea and vomiting   Traumatic ecchymosis of toe   Wound cellulitis   Tobacco abuse   1. Uncontrolled diabetes mellitus type 1 and hyperosmolar state - continue with IV insulin infusion. Patient does have mild anion gap. I think patient is not in DKA. We will continue with IV insulin infusion and change to long-acting insulin once CBG is less than 250. Repeat metabolic panel and if anion gap is elevated then we'll continue insulin infusion until  gap is corrected. Diabetes probably uncontrolled secondary to patient not taking his medication and patient does have history of noncompliance. Check hemoglobin A A1c. Continue with IV hydration. 2. Hyponatremia probably secondary to hyperglycemia and dehydration - hydrate and closely follow metabolic panel and intake output. 3. Right third toe discoloration - patient has good bounding pulses. Monitor shows sinus rhythm. Possibly this could be secondary to trauma. Get MRI of the foot to rule out any osteomyelitis. 4. Multiple bones of the skin with mild discharge - I have placed patient on vancomycin for now and requested wound team consult. 5. Tobacco abuse - strongly advised patient to quit smoking.    Code Status: Full code.  Family Communication: Patient's wife and mother at the bedside.  Disposition Plan: Admit to inpatient.    Kinney Sackmann N. [facility] Pager [phone].  If 7PM-7AM, please contact night-coverage [email or web address] Password [facility] 01/29/2013, 12:20 AM

## 2013-01-29 NOTE — Progress Notes (Signed)
Report was given to receiving nurse on 5500, pt has been transferred to 5527------Fletcher Ostermiller, rn

## 2013-01-30 DIAGNOSIS — L0291 Cutaneous abscess, unspecified: Secondary | ICD-10-CM

## 2013-01-30 DIAGNOSIS — L0390 Cellulitis, unspecified: Secondary | ICD-10-CM

## 2013-01-30 MED ORDER — HYDROCODONE-ACETAMINOPHEN 5-325 MG PO TABS: 1.0000 | ORAL_TABLET | Freq: Four times a day (QID) | ORAL | Status: DC | PRN

## 2013-01-30 MED ORDER — INSULIN GLARGINE 100 UNIT/ML ~~LOC~~ SOLN: 32.0000 [IU] | Freq: Every day | SUBCUTANEOUS | Status: DC

## 2013-01-30 MED ORDER — SULFAMETHOXAZOLE-TMP DS 800-160 MG PO TABS
1.0000 | ORAL_TABLET | Freq: Two times a day (BID) | ORAL | Status: DC
  Filled 2013-01-30 (×2): qty 1

## 2013-01-30 MED ORDER — PREGABALIN 75 MG PO CAPS: 75.0000 mg | ORAL_CAPSULE | Freq: Every day | ORAL | Status: DC

## 2013-01-30 MED ORDER — DOXYCYCLINE HYCLATE 100 MG PO TABS
100.0000 mg | ORAL_TABLET | Freq: Two times a day (BID) | ORAL | Status: DC
  Administered 2013-01-30: 100 mg via ORAL
  Filled 2013-01-30 (×2): qty 1

## 2013-01-30 MED ORDER — PNEUMOCOCCAL VAC POLYVALENT 25 MCG/0.5ML IJ INJ
0.5000 mL | INJECTION | INTRAMUSCULAR | Status: AC
  Administered 2013-01-30: 0.5 mL via INTRAMUSCULAR
  Filled 2013-01-30: qty 0.5

## 2013-01-30 MED ORDER — INSULIN ASPART 100 UNIT/ML ~~LOC~~ SOLN: 1.0000 [IU] | Freq: Three times a day (TID) | SUBCUTANEOUS | Status: DC

## 2013-01-30 MED ORDER — MUPIROCIN CALCIUM 2 % EX CREA: TOPICAL_CREAM | Freq: Every day | CUTANEOUS | Status: DC

## 2013-01-30 NOTE — Progress Notes (Signed)
NURSING PROGRESS NOTE  Jason [name] [id number] Discharge Data: 01/30/2013 1:39 PM Attending Provider: Maretta Bees, MD PCP:FRY,STEPHEN A, MD     Helyn App to be D/C'd Home per MD order.  Discussed with the patient the After Visit Summary and all questions fully answered. All IV's discontinued with no bleeding noted. All belongings returned to patient for patient to take home.   Last Vital Signs:  Blood pressure 117/85, pulse 91, temperature 98.2 F (36.8 C), temperature source Oral, resp. rate 16, height 5\' 8"  (1.727 m), weight 58.196 kg (128 lb 4.8 oz), SpO2 100.00%.  Discharge Medication List   Medication List    STOP taking these medications       oxyCODONE-acetaminophen 10-325 MG per tablet  Commonly known as:  PERCOCET      TAKE these medications       clindamycin 300 MG capsule  Commonly known as:  CLEOCIN  Take 300 mg by mouth 3 (three) times daily. 7 day treatment for leg ulcer     HYDROcodone-acetaminophen 5-325 MG per tablet  Commonly known as:  NORCO/VICODIN  Take 1 tablet by mouth every 6 (six) hours as needed for pain.     insulin aspart 100 UNIT/ML injection  Commonly known as:  NOVOLOG FLEXPEN  Inject 1-17 Units into the skin 4 (four) times daily -  before meals and at bedtime.     insulin glargine 100 UNIT/ML injection  Commonly known as:  LANTUS  Inject 0.32 mLs (32 Units total) into the skin at bedtime.     mupirocin cream 2 %  Commonly known as:  BACTROBAN  Apply topically daily. To right leg wound     pregabalin 75 MG capsule  Commonly known as:  LYRICA  Take 1 capsule (75 mg total) by mouth daily.     ZEGERID OTC 20-1100 MG Caps  Generic drug:  Omeprazole-Sodium Bicarbonate  Take 1 capsule by mouth at bedtime.     zolpidem 10 MG tablet  Commonly known as:  AMBIEN  Take 1 tablet (10 mg total) by mouth at bedtime as needed for sleep.     zolpidem 10 MG tablet  Commonly known as:  AMBIEN  Take 10 mg by mouth at bedtime as  needed for sleep.         Madelin Rear RN, MSN, [employer]

## 2013-01-30 NOTE — Discharge Summary (Signed)
Physician Discharge Summary  Jason [name]:[id number] DOB: 12/23/1985 DOA: 01/28/2013  PCP: Nelwyn Salisbury, MD  Admit date: 01/28/2013 Discharge date: 01/30/2013  Time spent: 45  minutes  Recommendations for Outpatient Follow-up:  Follow up on finalized blood cultures from 4/24. Patient with severe peripheral neuropathy and developing autonomic neuropathy.   Admitted for cellulitis in his 3rd toe.  Also with skin wounds. Needs close follow up for DM type I.  Discharge Diagnoses:  Active Problems:   Type 1 diabetes, uncontrolled, with neuropathy   Diabetic hyperosmolar non-ketotic state   Nausea and vomiting   Traumatic ecchymosis of toe   Wound cellulitis   Tobacco abuse   Noncompliance with diet and medication regimen   Discharge Condition: stable.  Tolerating diet, ambulating  Diet recommendation: carb modified  Filed Weights   01/28/13 1809 01/28/13 2310  Weight: 69.4 kg (153 lb) 58.196 kg (128 lb 4.8 oz)    History of present illness:  Jason [name] is a 27 y.o. male with history of diabetes mellitus type 1 and peripheral neuropathy who presented to the ER in [facility] [place] with complaints of discolored third toe of the right foot. Patient noticed this last evening at home. Denies any trauma. Patient states that he has neuropathy and usually doesn't feel pain in the extremities. In addition patient also has been having nausea vomiting since morning. In the eye was found to have blood sugars more than 800 with slightly increased anion gap. X-rays of the foot does not reveal anything acute. Patient has been admitted for further management. Patient has been started on IV insulin infusion with fluids. Patient has nausea vomiting since morning but denies any abdominal pain or diarrhea. Patient states since he has not eaten anything since morning he did not take his insulin. Denies any chest pain or shortness of breath fever chills. Patient has been having multiple skin  wounds. 2 of them on the shoulder and one on his right shin. Patient states that these wounds have been there for a few months and it has discharge. Patient was prescribed antibiotics previously and has not taken it.   Hospital Course:   Type 1 diabetes, uncontrolled, with neuropathy/Diabetic hyperosmolar non-ketotic state  -He was admitted to the step down unit on an insulin drip and gradually transitioned to sub Q Lantus  -Cbgs are now controlled on home doses of insulin.  Will discharge with refills. -suspect infection may have precipitated severe hyperglycemia  -cont home dose Vicodin but increase Lyrica from daily to TID  -Patient reports intermittent blurry vision, urinary hesitancy, vomiting and foot pain - autonomic as well as peripheral neuropathy.  Counseled that this was all related to his diabetes and cbg control. -he has a previously scheduled appointment at a local Diabetic Center in [facility] next Thursday-where he has an appointment with a MD and wound care  Nausea and vomiting  -due to hyperglycemia and transient gastroparesis  -Patient describes having these episodes approximately once a week.  Discussed gastroparesis diet.  Traumatic ecchymosis of toe/Wound cellulitis  -MRI shows cellulitis at tip of toe without osteomyelitis  -MRSA PCR positive nasal swab will treat cellulitis as MRSA infection.  Vancomycin inpatient, Clindamycin outpatient. -since endorsed myalgias/diffuse body pain, blood cultures were obtained to r/o bacteremia - negative at time of discharge. -we will discharge the patient on oral antibiotics-he already had clindamycin at home which he has not taken yet.  Tobacco abuse   Noncompliance with diet and medication regimen  -Diabetes  educator consult.  Primarily due to lack of insurance / medicaid.   Discharge Exam: Filed Vitals:   01/29/13 1557 01/29/13 1900 01/29/13 2154 01/30/13 0521  BP: 109/82 144/94 133/94 117/85  Pulse: 108 107 111 91   Temp: 98.6 F (37 C) 98.4 F (36.9 C) 99.1 F (37.3 C) 98.2 F (36.8 C)  TempSrc: Oral Oral Oral Oral  Resp: 18 18 16 16   Height:      Weight:      SpO2: 98% 99% 100% 100%    General: A&O, NAD, Sitting up.  Appears well Cardiovascular: rrr no m/r/g Respiratory: cta no w/c/r Abdomen:  Thin soft, nt, nd, +BS Extremities:  Third left toe is blackened on the dorsal side.  He has no sensation in the toe.  No other lesions noted on the foot.   Discharge Instructions      Discharge Orders   Future Orders Complete By Expires     Diet - low sodium heart healthy  As directed     Diet Carb Modified  As directed     Increase activity slowly  As directed         Medication List    STOP taking these medications       oxyCODONE-acetaminophen 10-325 MG per tablet  Commonly known as:  PERCOCET      TAKE these medications       clindamycin 300 MG capsule  Commonly known as:  CLEOCIN  Take 300 mg by mouth 3 (three) times daily. 7 day treatment for leg ulcer     HYDROcodone-acetaminophen 5-325 MG per tablet  Commonly known as:  NORCO/VICODIN  Take 1 tablet by mouth every 6 (six) hours as needed for pain.     insulin aspart 100 UNIT/ML injection  Commonly known as:  NOVOLOG FLEXPEN  Inject 1-17 Units into the skin 4 (four) times daily -  before meals and at bedtime.     insulin glargine 100 UNIT/ML injection  Commonly known as:  LANTUS  Inject 0.32 mLs (32 Units total) into the skin at bedtime.     mupirocin cream 2 %  Commonly known as:  BACTROBAN  Apply topically daily. To right leg wound     pregabalin 75 MG capsule  Commonly known as:  LYRICA  Take 1 capsule (75 mg total) by mouth daily.     ZEGERID OTC 20-1100 MG Caps  Generic drug:  Omeprazole-Sodium Bicarbonate  Take 1 capsule by mouth at bedtime.     zolpidem 10 MG tablet  Commonly known as:  AMBIEN  Take 1 tablet (10 mg total) by mouth at bedtime as needed for sleep.     zolpidem 10 MG tablet  Commonly  known as:  AMBIEN  Take 10 mg by mouth at bedtime as needed for sleep.       Follow-up Information   Follow up with FRY,STEPHEN A, MD. Call in 2 weeks.   Contact information:   [address] [place] [address] [phone]       Please follow up. (Please follow up with Previously scheduled Diabetes Clinic Appointment in [facility] next week.)        The results of significant diagnostics from this hospitalization (including imaging, microbiology, ancillary and laboratory) are listed below for reference.    Significant Diagnostic Studies: Mr Foot Right W Wo Contrast  01/29/2013  *RADIOLOGY REPORT*  Clinical Data: Pain, swelling, and discoloration of the right third toe.  MRI OF THE RIGHT FOREFOOT WITHOUT  AND WITH CONTRAST  Technique:  Multiplanar, multisequence MR imaging was performed both before and after administration of intravenous contrast.  Contrast: 10mL MULTIHANCE GADOBENATE DIMEGLUMINE 529 MG/ML IV SOLN  Comparison: Radiographs dated 01/28/2013  Findings: There is no bone destruction. There is edema in the distal phalangeal bone of the third toe and there is slight enhancement of that bone after contrast.  The other visualized bones of the forefoot are normal.  There is slight enhancement of the soft tissues at the tip of the third toe. No visible ulcer.   IMPRESSION:  1.  Cellulitis of the tip of the third toe. 2.  Slight edema and subtle enhancement of the distal phalangeal bone of the third toe which could be due to hyperemia due to the adjacent cellulitis.  There is no bone destruction at this time. No findings definitive for osteomyelitis.   Original Report Authenticated By: Francene Boyers, M.D.    Dg Foot Complete Right  01/28/2013  *RADIOLOGY REPORT*  Clinical Data: Right third to do ecchymosis.  RIGHT FOOT COMPLETE - 3+ VIEW  Comparison: None available.  Findings: No acute bone or soft tissue abnormalities are present. Small vessel calcifications are compatible  with diabetes.  No significant osseous abnormality is evident within the third digit.  IMPRESSION:  1.  No acute abnormality. 2.  Small vessel calcifications compatible with diabetes.   Original Report Authenticated By: Marin Roberts, M.D.     Microbiology: Recent Results (from the past 240 hour(s))  MRSA PCR SCREENING     Status: Abnormal   Collection Time    01/28/13 11:26 PM      Result Value Range Status   MRSA by PCR POSITIVE (*) NEGATIVE Final   Comment:            The GeneXpert MRSA Assay (FDA     approved for NASAL specimens     only), is one component of a     comprehensive MRSA colonization     surveillance program. It is not     intended to diagnose MRSA     infection nor to guide or     monitor treatment for     MRSA infections.     RESULT CALLED TO, READ BACK BY AND VERIFIED WITH:     OFORI,E RN [phone] AT 0242 SKEENP  CULTURE, BLOOD (ROUTINE X 2)     Status: None   Collection Time    01/29/13 11:20 AM      Result Value Range Status   Specimen Description BLOOD LEFT ARM   Final   Special Requests BOTTLES DRAWN AEROBIC AND ANAEROBIC 10CC   Final   Culture  Setup Time 01/29/2013 22:08   Final   Culture     Final   Value:        BLOOD CULTURE RECEIVED NO GROWTH TO DATE CULTURE WILL BE HELD FOR 5 DAYS BEFORE ISSUING A FINAL NEGATIVE REPORT   Report Status PENDING   Incomplete  CULTURE, BLOOD (ROUTINE X 2)     Status: None   Collection Time    01/29/13 11:30 AM      Result Value Range Status   Specimen Description BLOOD LEFT HAND   Final   Special Requests BOTTLES DRAWN AEROBIC AND ANAEROBIC 10CC   Final   Culture  Setup Time 01/29/2013 22:08   Final   Culture     Final   Value:        BLOOD CULTURE RECEIVED NO GROWTH TO DATE  CULTURE WILL BE HELD FOR 5 DAYS BEFORE ISSUING A FINAL NEGATIVE REPORT   Report Status PENDING   Incomplete     Labs: Basic Metabolic Panel:  Recent Labs Lab 01/28/13 1941 01/29/13 0120  NA 125* 136  K 4.8 3.5  CL 84* 98  CO2 25  29  GLUCOSE 817* 196*  BUN 14 10  CREATININE 1.00 0.86  CALCIUM 9.6 8.9   Liver Function Tests:  Recent Labs Lab 01/28/13 1941  AST 11  ALT 24  ALKPHOS 157*  BILITOT 0.7  PROT 8.6*  ALBUMIN 3.7   CBC:  Recent Labs Lab 01/28/13 1941 01/29/13 0120  WBC 8.4 9.4  NEUTROABS 4.6  --   HGB 12.1* 10.8*  HCT 35.3* 31.4*  MCV 81.0 77.3*  PLT 424* 385    Recent Labs Lab 01/29/13 0731 01/29/13 1211 01/29/13 1556 01/29/13 2155 01/30/13 0746  GLUCAP 273* 83 211* 131* 107*    Signed:  Conley Canal [facility] 01/30/2013, 10:25 AM  Attending Patient seen and examined, agree with the assessment and plan as outlined above. Afebrile, blood cultures negative, mild cellulitis or bruise at right 3rd toe tip-without underlying osteo on MRI. Stable for discharge on oral clindamycin, has a previously scheduled appointment with a MD and wound care at a diabetic center this coming Thursday. Patient knows that he is to come back to the ED or seek immediate attention if the toe ulcerates or if the erythema progresses proximally  S Shalla Bulluck  S Jacody Beneke

## 2013-01-30 NOTE — Care Management Note (Signed)
    Page 1 of 1   01/30/2013     12:42:56 PM   CARE MANAGEMENT NOTE 01/30/2013  Patient:  ROMARI, GASPARRO   Account Number:  [id number]  Date Initiated:  01/30/2013  Documentation initiated by:  Letha Cape  Subjective/Objective Assessment:   dx hyperosmolar non ketotic  admit- lives with spouse, pta indep.     Action/Plan:   Anticipated DC Date:  01/30/2013   Anticipated DC Plan:  HOME/SELF CARE      DC Planning Services  CM consult  MATCH Program  Follow-up appt scheduled      Choice offered to / List presented to:             Status of service:  Completed, signed off Medicare Important Message given?   (If response is "NO", the following Medicare IM given date fields will be blank) Date Medicare IM given:   Date Additional Medicare IM given:    Discharge Disposition:  HOME/SELF CARE  Per UR Regulation:  Reviewed for med. necessity/level of care/duration of stay  If discussed at Long Length of Stay Meetings, dates discussed:    Comments:  01/30/13 12:40 Letha Cape RN, BSN  [phone] patient lives with spouse, pta indep.  Patient has transportation, pt will need ast with Meds thru Match Program.  Pt will pick up his meds from the outpt pharmacy. I also set up an apt for urgent care on 5/7 at 11 am, and gave him a self pay resource list of facilities.

## 2013-02-04 LAB — CULTURE, BLOOD (ROUTINE X 2): Culture: NO GROWTH

## 2013-02-09 ENCOUNTER — Telehealth: Payer: Self-pay | Admitting: Family Medicine

## 2013-02-09 NOTE — Care Management Note (Signed)
Patient did not have his medications filled within seven days.  I gave him one additional day on the match letter and informed him there will not be another extension offered if he fails to have them filled today.

## 2013-02-09 NOTE — Telephone Encounter (Signed)
NO refills. I see he is seeing doctors in [facility], so he needs to ask them

## 2013-02-09 NOTE — Telephone Encounter (Signed)
See below note.

## 2013-02-09 NOTE — Telephone Encounter (Signed)
This is a duplicate phone note, previous refill request was sent to doctor.

## 2013-02-09 NOTE — Telephone Encounter (Signed)
Pt stated that when he went to pick up his Vicodin from [facility] today, they stated that they needed authorization from the doctor to fill this. The pharmacy can be reached at [phone]. Please assist.

## 2013-02-09 NOTE — Telephone Encounter (Signed)
Patient is requesting a refill on Norco 5/325mg  one tablet every 6 hours. He recently signed up for the match program (medication assistance). He states the pharmacist told him he needed authorization approval to pick up the Norco.  Pharmacy- [facility] in [place].  Please contact patient for questions.[phone]

## 2013-02-10 NOTE — Telephone Encounter (Signed)
I spoke with pt and he will follow up with diabetic doctor.

## 2013-03-08 ENCOUNTER — Emergency Department (HOSPITAL_BASED_OUTPATIENT_CLINIC_OR_DEPARTMENT_OTHER)
Admission: EM | Admit: 2013-03-08 | Discharge: 2013-03-09 | Disposition: A | Discharge status: Home / Self Care | Payer: Medicaid Other | Attending: Emergency Medicine | Admitting: Emergency Medicine

## 2013-03-08 ENCOUNTER — Encounter (HOSPITAL_BASED_OUTPATIENT_CLINIC_OR_DEPARTMENT_OTHER): Payer: Self-pay | Admitting: *Deleted

## 2013-03-08 DIAGNOSIS — E1169 Type 2 diabetes mellitus with other specified complication: Secondary | ICD-10-CM | POA: Diagnosis not present

## 2013-03-08 DIAGNOSIS — Z880 Allergy status to penicillin: Secondary | ICD-10-CM | POA: Diagnosis not present

## 2013-03-08 DIAGNOSIS — Z79899 Other long term (current) drug therapy: Secondary | ICD-10-CM | POA: Diagnosis not present

## 2013-03-08 DIAGNOSIS — J45909 Unspecified asthma, uncomplicated: Secondary | ICD-10-CM | POA: Diagnosis not present

## 2013-03-08 DIAGNOSIS — R197 Diarrhea, unspecified: Secondary | ICD-10-CM | POA: Diagnosis not present

## 2013-03-08 DIAGNOSIS — Z794 Long term (current) use of insulin: Secondary | ICD-10-CM | POA: Insufficient documentation

## 2013-03-08 DIAGNOSIS — R112 Nausea with vomiting, unspecified: Secondary | ICD-10-CM | POA: Insufficient documentation

## 2013-03-08 DIAGNOSIS — Z8614 Personal history of Methicillin resistant Staphylococcus aureus infection: Secondary | ICD-10-CM | POA: Insufficient documentation

## 2013-03-08 DIAGNOSIS — K219 Gastro-esophageal reflux disease without esophagitis: Secondary | ICD-10-CM | POA: Diagnosis not present

## 2013-03-08 DIAGNOSIS — F17200 Nicotine dependence, unspecified, uncomplicated: Secondary | ICD-10-CM | POA: Insufficient documentation

## 2013-03-08 DIAGNOSIS — R52 Pain, unspecified: Secondary | ICD-10-CM | POA: Diagnosis present

## 2013-03-08 DIAGNOSIS — J3489 Other specified disorders of nose and nasal sinuses: Secondary | ICD-10-CM | POA: Insufficient documentation

## 2013-03-08 DIAGNOSIS — R739 Hyperglycemia, unspecified: Secondary | ICD-10-CM

## 2013-03-08 DIAGNOSIS — E1140 Type 2 diabetes mellitus with diabetic neuropathy, unspecified: Secondary | ICD-10-CM

## 2013-03-08 LAB — POCT I-STAT 3, VENOUS BLOOD GAS (G3P V)
Acid-Base Excess: 5 mmol/L — ABNORMAL HIGH (ref 0.0–2.0)
O2 Saturation: 44 %
Patient temperature: 99.5
TCO2: 33 mmol/L (ref 0–100)

## 2013-03-08 LAB — CBC WITH DIFFERENTIAL/PLATELET
Eosinophils Absolute: 0.4 10*3/uL (ref 0.0–0.7)
Eosinophils Relative: 5 % (ref 0–5)
Lymphs Abs: 2.6 10*3/uL (ref 0.7–4.0)
MCH: 27.5 pg (ref 26.0–34.0)
MCHC: 33.5 g/dL (ref 30.0–36.0)
MCV: 82.2 fL (ref 78.0–100.0)
Platelets: 327 10*3/uL (ref 150–400)
RBC: 4.54 MIL/uL (ref 4.22–5.81)

## 2013-03-08 LAB — GLUCOSE, CAPILLARY: Glucose-Capillary: 515 mg/dL — ABNORMAL HIGH (ref 70–99)

## 2013-03-08 MED ORDER — ONDANSETRON HCL 4 MG/2ML IJ SOLN
4.0000 mg | Freq: Once | INTRAMUSCULAR | Status: AC
  Administered 2013-03-08: 4 mg via INTRAVENOUS
  Filled 2013-03-08: qty 2

## 2013-03-08 MED ORDER — SODIUM CHLORIDE 0.9 % IV BOLUS (SEPSIS)
1000.0000 mL | Freq: Once | INTRAVENOUS | Status: AC
  Administered 2013-03-08: 1000 mL via INTRAVENOUS

## 2013-03-08 MED ORDER — INSULIN REGULAR HUMAN 100 UNIT/ML IJ SOLN
INTRAMUSCULAR | Status: AC
  Filled 2013-03-08: qty 1

## 2013-03-08 MED ORDER — HYDROMORPHONE HCL PF 1 MG/ML IJ SOLN
1.0000 mg | Freq: Once | INTRAMUSCULAR | Status: AC
  Administered 2013-03-08: 1 mg via INTRAVENOUS
  Filled 2013-03-08: qty 1

## 2013-03-08 MED ORDER — SODIUM CHLORIDE 0.9 % IV SOLN
INTRAVENOUS | Status: DC
  Administered 2013-03-08: 3.7 [IU]/h via INTRAVENOUS

## 2013-03-08 NOTE — ED Notes (Addendum)
C/o "neuopathy" type pain that started today. States this is a general type pain.  States he is diabetic. C/o n/v earlier today and decreased appetite that started today. States he has taken Vicodin without relief today.

## 2013-03-08 NOTE — ED Provider Notes (Signed)
History     CSN: [id number]  Arrival date & time 03/08/13  2109   None     Chief Complaint  Patient presents with  . Generalized Pain     (Consider location/radiation/quality/duration/timing/severity/associated sxs/prior treatment) HPI This is a 27 year old insulin-dependent diabetic who complains of generalized body pain since this morning. He states he took his Lyrica and hydrocodone and "it's as if I didn't take them". The pain is moderate to severe and achy in nature. It is consistent with previous diabetic neuropathic pain. He had nausea, vomiting and diarrhea this morning. He took his morning insulin but did not take his evening insulin because he has not been eating. He has been drinking Gatorade today. He complains of nasal congestion but denies shortness of breath, polydipsia or polyuria.  Past Medical History  Diagnosis Date  . Diabetes mellitus   . GERD (gastroesophageal reflux disease)   . Asthma   . Hx MRSA infection     on face    Past Surgical History  Procedure Laterality Date  . Tonsillectomy      Family History  Problem Relation Age of Onset  . Diabetes Father   . Asthma      fhx  . Hypertension      fhx  . Stroke      fhx    History  Substance Use Topics  . Smoking status: Current Every Day Smoker    Types: Cigars  . Smokeless tobacco: Never Used     Comment: 2 per day  . Alcohol Use: No      Review of Systems  All other systems reviewed and are negative.    Allergies  Cefuroxime axetil; Penicillins; Tessalon; and Shellfish allergy  Home Medications   Current Outpatient Rx  Name  Route  Sig  Dispense  Refill  . clindamycin (CLEOCIN) 300 MG capsule   Oral   Take 300 mg by mouth 3 (three) times daily. 7 day treatment for leg ulcer         . HYDROcodone-acetaminophen (NORCO/VICODIN) 5-325 MG per tablet   Oral   Take 1 tablet by mouth every 6 (six) hours as needed for pain.   17 tablet   0   . insulin aspart (NOVOLOG FLEXPEN)  100 UNIT/ML injection   Subcutaneous   Inject 1-17 Units into the skin 4 (four) times daily -  before meals and at bedtime.   1 vial   6   . insulin glargine (LANTUS) 100 UNIT/ML injection   Subcutaneous   Inject 0.32 mLs (32 Units total) into the skin at bedtime.   10 mL   6   . mupirocin cream (BACTROBAN) 2 %   Topical   Apply topically daily. To right leg wound   15 g   0   . Omeprazole-Sodium Bicarbonate (ZEGERID OTC) 20-1100 MG CAPS   Oral   Take 1 capsule by mouth at bedtime.          . pregabalin (LYRICA) 75 MG capsule   Oral   Take 1 capsule (75 mg total) by mouth daily.   30 capsule   3   . EXPIRED: zolpidem (AMBIEN) 10 MG tablet   Oral   Take 1 tablet (10 mg total) by mouth at bedtime as needed for sleep.   30 tablet   0   . zolpidem (AMBIEN) 10 MG tablet   Oral   Take 10 mg by mouth at bedtime as needed for sleep.  BP 112/67  Pulse 102  Temp(Src) 99.5 F (37.5 C) (Oral)  Resp 23  SpO2 100%  Physical Exam General: Well-developed, well-nourished male in no acute distress; appearance consistent with age of record HENT: normocephalic, atraumatic; mucous membranes moist Eyes: pupils equal round and reactive to light; extraocular muscles intact Neck: supple Heart: regular rate and rhythm; tachycardic Lungs: clear to auscultation bilaterally; tachypnea Abdomen: soft; nondistended; nontender; bowel sounds present Back: Decreased range of motion due to pain Extremities: No deformity; full range of motion; pulses normal; no edema Neurologic: Awake, alert and oriented; motor function intact in all extremities and symmetric; no facial droop Skin: Warm and dry Psychiatric: Flat affect    ED Course  Procedures (including critical care time)     MDM   Nursing notes and vitals signs, including pulse oximetry, reviewed.  Summary of this visit's results, reviewed by myself:  Labs:  Results for orders placed during the hospital  encounter of 03/08/13 (from the past 24 hour(s))  GLUCOSE, CAPILLARY     Status: Abnormal   Collection Time    03/08/13 10:50 PM      Result Value Range   Glucose-Capillary 515 (*) 70 - 99 mg/dL   Comment 1 Notify RN     Comment 2 Documented in Chart    POCT I-STAT 3, BLOOD GAS (G3P V)     Status: Abnormal   Collection Time    03/08/13 11:17 PM      Result Value Range   pH, Ven 7.360 (*) 7.250 - 7.300   pCO2, Ven 56.1 (*) 45.0 - 50.0 mmHg   pO2, Ven 27.0 (*) 30.0 - 45.0 mmHg   Bicarbonate 31.5 (*) 20.0 - 24.0 mEq/L   TCO2 33  0 - 100 mmol/L   O2 Saturation 44.0     Acid-Base Excess 5.0 (*) 0.0 - 2.0 mmol/L   Patient temperature 99.5 F     Collection site IV START     Drawn by Nurse     Sample type VENOUS     Comment NOTIFIED PHYSICIAN    CBC WITH DIFFERENTIAL     Status: Abnormal   Collection Time    03/08/13 11:24 PM      Result Value Range   WBC 7.0  4.0 - 10.5 K/uL   RBC 4.54  4.22 - 5.81 MIL/uL   Hemoglobin 12.5 (*) 13.0 - 17.0 g/dL   HCT 16.1 (*) 09.6 - 04.5 %   MCV 82.2  78.0 - 100.0 fL   MCH 27.5  26.0 - 34.0 pg   MCHC 33.5  30.0 - 36.0 g/dL   RDW 40.9  81.1 - 91.4 %   Platelets 327  150 - 400 K/uL   Neutrophils Relative % 45  43 - 77 %   Neutro Abs 3.2  1.7 - 7.7 K/uL   Lymphocytes Relative 37  12 - 46 %   Lymphs Abs 2.6  0.7 - 4.0 K/uL   Monocytes Relative 12  3 - 12 %   Monocytes Absolute 0.9  0.1 - 1.0 K/uL   Eosinophils Relative 5  0 - 5 %   Eosinophils Absolute 0.4  0.0 - 0.7 K/uL   Basophils Relative 1  0 - 1 %   Basophils Absolute 0.0  0.0 - 0.1 K/uL  BASIC METABOLIC PANEL     Status: Abnormal   Collection Time    03/08/13 11:24 PM      Result Value Range   Sodium 132 (*) 135 -  145 mEq/L   Potassium 4.7  3.5 - 5.1 mEq/L   Chloride 92 (*) 96 - 112 mEq/L   CO2 30  19 - 32 mEq/L   Glucose, Bld 520 (*) 70 - 99 mg/dL   BUN 15  6 - 23 mg/dL   Creatinine, Ser 7.82 (*) 0.50 - 1.35 mg/dL   Calcium 9.9  8.4 - 95.6 mg/dL   GFR calc non Af Amer 67 (*) >90  mL/min   GFR calc Af Amer 78 (*) >90 mL/min  GLUCOSE, CAPILLARY     Status: Abnormal   Collection Time    03/08/13 11:54 PM      Result Value Range   Glucose-Capillary 433 (*) 70 - 99 mg/dL  URINALYSIS, ROUTINE W REFLEX MICROSCOPIC     Status: Abnormal   Collection Time    03/09/13 12:06 AM      Result Value Range   Color, Urine YELLOW  YELLOW   APPearance CLEAR  CLEAR   Specific Gravity, Urine 1.034 (*) 1.005 - 1.030   pH 5.5  5.0 - 8.0   Glucose, UA >1000 (*) NEGATIVE mg/dL   Hgb urine dipstick TRACE (*) NEGATIVE   Bilirubin Urine NEGATIVE  NEGATIVE   Ketones, ur 15 (*) NEGATIVE mg/dL   Protein, ur 30 (*) NEGATIVE mg/dL   Urobilinogen, UA 0.2  0.0 - 1.0 mg/dL   Nitrite NEGATIVE  NEGATIVE   Leukocytes, UA NEGATIVE  NEGATIVE  URINE MICROSCOPIC-ADD ON     Status: Abnormal   Collection Time    03/09/13 12:06 AM      Result Value Range   Squamous Epithelial / LPF FEW (*) RARE   WBC, UA 0-2  <3 WBC/hpf   RBC / HPF 3-6  <3 RBC/hpf   Casts HYALINE CASTS (*) NEGATIVE   2:58 AM Patient sugar is now 170 after hydration and intravenous insulin. His pain is now 2/10.       Hanley Seamen, MD 03/09/13 [phone]

## 2013-03-09 LAB — URINE MICROSCOPIC-ADD ON

## 2013-03-09 LAB — URINALYSIS, ROUTINE W REFLEX MICROSCOPIC
Bilirubin Urine: NEGATIVE
Protein, ur: 30 mg/dL — AB
Urobilinogen, UA: 0.2 mg/dL (ref 0.0–1.0)

## 2013-03-09 LAB — BASIC METABOLIC PANEL
BUN: 15 mg/dL (ref 6–23)
Calcium: 9.9 mg/dL (ref 8.4–10.5)
GFR calc non Af Amer: 67 mL/min — ABNORMAL LOW (ref >90)
Glucose, Bld: 520 mg/dL — ABNORMAL HIGH (ref 70–99)
Sodium: 132 mEq/L — ABNORMAL LOW (ref 135–145)

## 2013-03-09 LAB — GLUCOSE, CAPILLARY: Glucose-Capillary: 170 mg/dL — ABNORMAL HIGH (ref 70–99)

## 2013-03-09 MED ORDER — SODIUM CHLORIDE 0.9 % IV BOLUS (SEPSIS)
1000.0000 mL | Freq: Once | INTRAVENOUS | Status: AC
  Administered 2013-03-09: 1000 mL via INTRAVENOUS

## 2013-03-09 MED ORDER — OXYCODONE-ACETAMINOPHEN 10-325 MG PO TABS: 1.0000 | ORAL_TABLET | ORAL | Status: DC | PRN

## 2013-03-09 MED ORDER — HYDROMORPHONE HCL PF 1 MG/ML IJ SOLN
1.0000 mg | Freq: Once | INTRAMUSCULAR | Status: AC
  Administered 2013-03-09: 1 mg via INTRAVENOUS
  Filled 2013-03-09: qty 1

## 2013-03-19 ENCOUNTER — Observation Stay (HOSPITAL_BASED_OUTPATIENT_CLINIC_OR_DEPARTMENT_OTHER)
Admission: EM | Admit: 2013-03-19 | Discharge: 2013-03-20 | Disposition: A | Discharge status: Home / Self Care | Payer: Medicaid Other | Attending: Family Medicine | Admitting: Family Medicine

## 2013-03-19 ENCOUNTER — Emergency Department (HOSPITAL_BASED_OUTPATIENT_CLINIC_OR_DEPARTMENT_OTHER): Payer: Medicaid Other

## 2013-03-19 ENCOUNTER — Encounter (HOSPITAL_BASED_OUTPATIENT_CLINIC_OR_DEPARTMENT_OTHER): Payer: Self-pay | Admitting: Emergency Medicine

## 2013-03-19 DIAGNOSIS — E860 Dehydration: Secondary | ICD-10-CM | POA: Diagnosis present

## 2013-03-19 DIAGNOSIS — K089 Disorder of teeth and supporting structures, unspecified: Principal | ICD-10-CM | POA: Insufficient documentation

## 2013-03-19 DIAGNOSIS — R739 Hyperglycemia, unspecified: Secondary | ICD-10-CM

## 2013-03-19 DIAGNOSIS — K219 Gastro-esophageal reflux disease without esophagitis: Secondary | ICD-10-CM | POA: Insufficient documentation

## 2013-03-19 DIAGNOSIS — E109 Type 1 diabetes mellitus without complications: Secondary | ICD-10-CM | POA: Insufficient documentation

## 2013-03-19 DIAGNOSIS — K047 Periapical abscess without sinus: Secondary | ICD-10-CM | POA: Diagnosis present

## 2013-03-19 DIAGNOSIS — K029 Dental caries, unspecified: Secondary | ICD-10-CM

## 2013-03-19 DIAGNOSIS — Z720 Tobacco use: Secondary | ICD-10-CM | POA: Diagnosis present

## 2013-03-19 DIAGNOSIS — E1149 Type 2 diabetes mellitus with other diabetic neurological complication: Secondary | ICD-10-CM | POA: Diagnosis present

## 2013-03-19 DIAGNOSIS — E1140 Type 2 diabetes mellitus with diabetic neuropathy, unspecified: Secondary | ICD-10-CM

## 2013-03-19 LAB — BASIC METABOLIC PANEL
BUN: 9 mg/dL (ref 6–23)
Calcium: 9.7 mg/dL (ref 8.4–10.5)
GFR calc Af Amer: >90 mL/min (ref >90)
GFR calc non Af Amer: 81 mL/min — ABNORMAL LOW (ref >90)
Potassium: 4.1 mEq/L (ref 3.5–5.1)

## 2013-03-19 LAB — CBC WITH DIFFERENTIAL/PLATELET
Basophils Relative: 1 % (ref 0–1)
Eosinophils Absolute: 0.1 10*3/uL (ref 0.0–0.7)
Eosinophils Relative: 2 % (ref 0–5)
Hemoglobin: 12.3 g/dL — ABNORMAL LOW (ref 13.0–17.0)
MCH: 28.1 pg (ref 26.0–34.0)
MCHC: 34.6 g/dL (ref 30.0–36.0)
Monocytes Relative: 18 % — ABNORMAL HIGH (ref 3–12)
Neutrophils Relative %: 63 % (ref 43–77)
Platelets: 342 10*3/uL (ref 150–400)

## 2013-03-19 LAB — URINALYSIS, ROUTINE W REFLEX MICROSCOPIC
Leukocytes, UA: NEGATIVE
Protein, ur: 30 mg/dL — AB
Urobilinogen, UA: 0.2 mg/dL (ref 0.0–1.0)

## 2013-03-19 LAB — URINE MICROSCOPIC-ADD ON

## 2013-03-19 LAB — POCT I-STAT 3, VENOUS BLOOD GAS (G3P V)
Acid-Base Excess: 6 mmol/L — ABNORMAL HIGH (ref 0.0–2.0)
Patient temperature: 102.2
pO2, Ven: 47 mmHg — ABNORMAL HIGH (ref 30.0–45.0)

## 2013-03-19 MED ORDER — SODIUM CHLORIDE 0.9 % IV SOLN
INTRAVENOUS | Status: DC
  Administered 2013-03-20: 3.7 [IU]/h via INTRAVENOUS

## 2013-03-19 MED ORDER — FENTANYL CITRATE 0.05 MG/ML IJ SOLN
100.0000 ug | Freq: Once | INTRAMUSCULAR | Status: AC
  Administered 2013-03-19: 100 ug via INTRAVENOUS
  Filled 2013-03-19: qty 2

## 2013-03-19 MED ORDER — ONDANSETRON HCL 4 MG/2ML IJ SOLN
4.0000 mg | Freq: Once | INTRAMUSCULAR | Status: AC
  Administered 2013-03-19: 4 mg via INTRAVENOUS
  Filled 2013-03-19: qty 2

## 2013-03-19 MED ORDER — SODIUM CHLORIDE 0.9 % IV BOLUS (SEPSIS)
1000.0000 mL | Freq: Once | INTRAVENOUS | Status: AC
  Administered 2013-03-19: 1000 mL via INTRAVENOUS

## 2013-03-19 MED ORDER — ACETAMINOPHEN 325 MG PO TABS
650.0000 mg | ORAL_TABLET | Freq: Once | ORAL | Status: AC
  Administered 2013-03-19: 650 mg via ORAL
  Filled 2013-03-19: qty 2

## 2013-03-19 NOTE — ED Notes (Signed)
Pt c/o dental pain

## 2013-03-19 NOTE — ED Notes (Signed)
Dr Rosalia Hammers notified of CBG [id number]

## 2013-03-19 NOTE — ED Notes (Signed)
Attempt x 4 to start piv- GBR with each attempt but unable to thread catheter. Lorin Picket, RN at bedside to attempt

## 2013-03-19 NOTE — ED Notes (Signed)
Pt also states his neuropathy is "acting up".

## 2013-03-19 NOTE — ED Provider Notes (Signed)
History     CSN: [id number]  Arrival date & time 03/19/13  2100   First MD Initiated Contact with Patient 03/19/13 2302      Chief Complaint  Patient presents with  . Dental Pain    (Consider location/radiation/quality/duration/timing/severity/associated sxs/prior treatment) HPI This is a 27 year old type I diabetic with poorly controlled disease due to noncompliance. He is here complaining of pain and swelling of his gum adjacent to the left hard palate. He states it was swollen so badly earlier today he could not swallow but the swelling is less now. He states it is very painful. He a neurologist he has several broken and/or carious teeth. He has been admitted for infections of the mouth in the past.  He further states he has not been eating for the past 2 or 3 days due to dental pain. As a result he has not been taking his insulin and as a result his sugar is out of control. It is noted to be 471 on arrival here. He is also complaining of exacerbation of his diabetic neuropathy which is characterized as generalized severe pain that causes him to want to curl up in a ball. He states he is out of medication for pain. He states his neuropathic pain became so bad it gave him a migraine headache which caused him to vomit yesterday; vomiting relieved his migraine.  He is noted to be febrile on arrival and was given acetaminophen with improvement in his temperature.  Past Medical History  Diagnosis Date  . Diabetes mellitus   . GERD (gastroesophageal reflux disease)   . Asthma   . Hx MRSA infection     on face    Past Surgical History  Procedure Laterality Date  . Tonsillectomy      Family History  Problem Relation Age of Onset  . Diabetes Father   . Asthma      fhx  . Hypertension      fhx  . Stroke      fhx    History  Substance Use Topics  . Smoking status: Current Every Day Smoker    Types: Cigars  . Smokeless tobacco: Never Used     Comment: 2 per day  . Alcohol  Use: No      Review of Systems  All other systems reviewed and are negative.    Allergies  Cefuroxime axetil; Penicillins; Tessalon; and Shellfish allergy  Home Medications   Current Outpatient Rx  Name  Route  Sig  Dispense  Refill  . clindamycin (CLEOCIN) 300 MG capsule   Oral   Take 300 mg by mouth 3 (three) times daily. 7 day treatment for leg ulcer         . insulin aspart (NOVOLOG FLEXPEN) 100 UNIT/ML injection   Subcutaneous   Inject 1-17 Units into the skin 4 (four) times daily -  before meals and at bedtime.   1 vial   6   . insulin glargine (LANTUS) 100 UNIT/ML injection   Subcutaneous   Inject 0.32 mLs (32 Units total) into the skin at bedtime.   10 mL   6   . mupirocin cream (BACTROBAN) 2 %   Topical   Apply topically daily. To right leg wound   15 g   0   . Omeprazole-Sodium Bicarbonate (ZEGERID OTC) 20-1100 MG CAPS   Oral   Take 1 capsule by mouth at bedtime.          Marland Kitchen oxyCODONE-acetaminophen (PERCOCET) 10-325 MG  per tablet   Oral   Take 1 tablet by mouth every 4 (four) hours as needed for pain.   20 tablet   0   . pregabalin (LYRICA) 75 MG capsule   Oral   Take 1 capsule (75 mg total) by mouth daily.   30 capsule   3   . EXPIRED: zolpidem (AMBIEN) 10 MG tablet   Oral   Take 1 tablet (10 mg total) by mouth at bedtime as needed for sleep.   30 tablet   0   . zolpidem (AMBIEN) 10 MG tablet   Oral   Take 10 mg by mouth at bedtime as needed for sleep.           BP 123/83  Pulse 117  Temp(Src) 98.4 F (36.9 C) (Oral)  Resp 16  SpO2 93%  Physical Exam General: Well-developed, thin male in no acute distress; appearance consistent with age of record HENT: normocephalic, atraumatic; multiple pole fracture and/or carious teeth; swelling and tenderness of thumb adjacent to the lingual side of left upper molars and premolars; no submandibular tenderness or swelling Eyes: pupils equal round and reactive to light; extraocular muscles  intact Neck: supple; no lymphadenopathy Heart: regular rate and rhythm; tachycardia Lungs: clear to auscultation bilaterally Abdomen: soft; nondistended; nontender; bowel sounds present Extremities: No deformity; full range of motion Neurologic: Awake, alert and oriented; motor function intact in all extremities and symmetric; no facial droop Skin: Warm and dry Psychiatric: Flat affect    ED Course  Procedures (including critical care time)    MDM   Nursing notes and vitals signs, including pulse oximetry, reviewed.  Summary of this visit's results, reviewed by myself:  Labs:  Results for orders placed during the hospital encounter of 03/19/13 (from the past 24 hour(s))  GLUCOSE, CAPILLARY     Status: Abnormal   Collection Time    03/19/13 10:28 PM      Result Value Range   Glucose-Capillary 471 (*) 70 - 99 mg/dL  CBC WITH DIFFERENTIAL     Status: Abnormal   Collection Time    03/19/13 10:54 PM      Result Value Range   WBC 7.7  4.0 - 10.5 K/uL   RBC 4.38  4.22 - 5.81 MIL/uL   Hemoglobin 12.3 (*) 13.0 - 17.0 g/dL   HCT 29.5 (*) 62.1 - 30.8 %   MCV 81.3  78.0 - 100.0 fL   MCH 28.1  26.0 - 34.0 pg   MCHC 34.6  30.0 - 36.0 g/dL   RDW 65.7  84.6 - 96.2 %   Platelets 342  150 - 400 K/uL   Neutrophils Relative % 63  43 - 77 %   Neutro Abs 4.8  1.7 - 7.7 K/uL   Lymphocytes Relative 16  12 - 46 %   Lymphs Abs 1.2  0.7 - 4.0 K/uL   Monocytes Relative 18 (*) 3 - 12 %   Monocytes Absolute 1.4 (*) 0.1 - 1.0 K/uL   Eosinophils Relative 2  0 - 5 %   Eosinophils Absolute 0.1  0.0 - 0.7 K/uL   Basophils Relative 1  0 - 1 %   Basophils Absolute 0.1  0.0 - 0.1 K/uL  BASIC METABOLIC PANEL     Status: Abnormal   Collection Time    03/19/13 10:54 PM      Result Value Range   Sodium 134 (*) 135 - 145 mEq/L   Potassium 4.1  3.5 - 5.1 mEq/L  Chloride 90 (*) 96 - 112 mEq/L   CO2 29  19 - 32 mEq/L   Glucose, Bld 500 (*) 70 - 99 mg/dL   BUN 9  6 - 23 mg/dL   Creatinine, Ser 9.52   0.50 - 1.35 mg/dL   Calcium 9.7  8.4 - 84.1 mg/dL   GFR calc non Af Amer 81 (*) >90 mL/min   GFR calc Af Amer >90  >90 mL/min  POCT I-STAT 3, BLOOD GAS (G3P V)     Status: Abnormal   Collection Time    03/19/13 11:05 PM      Result Value Range   pH, Ven 7.360 (*) 7.250 - 7.300   pCO2, Ven 58.3 (*) 45.0 - 50.0 mmHg   pO2, Ven 47.0 (*) 30.0 - 45.0 mmHg   Bicarbonate 32.3 (*) 20.0 - 24.0 mEq/L   TCO2 34  0 - 100 mmol/L   O2 Saturation 74.0     Acid-Base Excess 6.0 (*) 0.0 - 2.0 mmol/L   Patient temperature 102.2 F     Collection site IV START     Drawn by RT     Sample type VENOUS     Comment NOTIFIED PHYSICIAN    URINALYSIS, ROUTINE W REFLEX MICROSCOPIC     Status: Abnormal   Collection Time    03/19/13 11:42 PM      Result Value Range   Color, Urine YELLOW  YELLOW   APPearance CLEAR  CLEAR   Specific Gravity, Urine 1.036 (*) 1.005 - 1.030   pH 6.0  5.0 - 8.0   Glucose, UA >1000 (*) NEGATIVE mg/dL   Hgb urine dipstick MODERATE (*) NEGATIVE   Bilirubin Urine NEGATIVE  NEGATIVE   Ketones, ur 15 (*) NEGATIVE mg/dL   Protein, ur 30 (*) NEGATIVE mg/dL   Urobilinogen, UA 0.2  0.0 - 1.0 mg/dL   Nitrite NEGATIVE  NEGATIVE   Leukocytes, UA NEGATIVE  NEGATIVE  URINE RAPID DRUG SCREEN (HOSP PERFORMED)     Status: None   Collection Time    03/19/13 11:42 PM      Result Value Range   Opiates NONE DETECTED  NONE DETECTED   Cocaine NONE DETECTED  NONE DETECTED   Benzodiazepines NONE DETECTED  NONE DETECTED   Amphetamines NONE DETECTED  NONE DETECTED   Tetrahydrocannabinol NONE DETECTED  NONE DETECTED   Barbiturates NONE DETECTED  NONE DETECTED  URINE MICROSCOPIC-ADD ON     Status: Abnormal   Collection Time    03/19/13 11:42 PM      Result Value Range   Squamous Epithelial / LPF FEW (*) RARE   WBC, UA 0-2  <3 WBC/hpf   RBC / HPF 11-20  <3 RBC/hpf  GLUCOSE, CAPILLARY     Status: Abnormal   Collection Time    03/20/13 12:01 AM      Result Value Range   Glucose-Capillary 429 (*)  70 - 99 mg/dL  GLUCOSE, CAPILLARY     Status: Abnormal   Collection Time    03/20/13  1:25 AM      Result Value Range   Glucose-Capillary 350 (*) 70 - 99 mg/dL  GLUCOSE, CAPILLARY     Status: Abnormal   Collection Time    03/20/13  2:44 AM      Result Value Range   Glucose-Capillary 282 (*) 70 - 99 mg/dL    Imaging Studies: Dg Chest 2 View  03/20/2013   *RADIOLOGY REPORT*  Clinical Data: Complaint of fever, neuropathy, and dental  pain.  CHEST - 2 VIEW  Comparison: 02/15/2010.  Findings: The heart size and pulmonary vascularity are normal. The lungs appear clear and expanded without focal air space disease or consolidation. No blunting of the costophrenic angles.  No pneumothorax.  Mediastinal contours appear intact.  No significant changes since the previous study.  IMPRESSION: No evidence of active pulmonary disease.   Original Report Authenticated By: Burman Nieves, M.D.   Ct Maxillofacial W/cm  03/20/2013   *RADIOLOGY REPORT*  Clinical Data: Left upper mouth pain.  CT MAXILLOFACIAL WITH CONTRAST  Technique:  Multidetector CT imaging of the maxillofacial structures was performed with intravenous contrast. Multiplanar CT image reconstructions were also generated.  Contrast: 80mL OMNIPAQUE IOHEXOL 300 MG/ML  SOLN  Comparison: None.  Findings: Multiple dental caries are demonstrated bilaterally. Peri apical lucencies are suggested at the left upper second molar suggesting periodontal disease.  No discrete abscess is demonstrated.  No abnormal soft tissue and contrast enhancement or fluid collection.  Mucosal membrane thickening in the left maxillary antrum and bilateral ethmoid air cells consistent with chronic inflammation.  No acute air-fluid levels are demonstrated in the paranasal sinuses.  Prominent level II lymph nodes are demonstrated on the left consistent with reactive nodes. Visualized orbital and facial bones appear intact without displaced fracture identified.  Deformity of the nasal  bones suggest old fracture deformity.  Visualized cervical carotid and jugular vessels are patent.  Salivary glands appear symmetrical.  No displacement of the cervical fat planes.  No prevertebral soft tissue swelling or mucosal infiltration is demonstrated.  IMPRESSION: Poor dentition with multiple dental caries.  Peri apical lucency around the left second molar.  No discrete abscess is demonstrated. Chronic inflammatory changes in the paranasal sinuses.   Original Report Authenticated By: Burman Nieves, M.D.    Clindamycin 600mg  IV given for dental infection. Hospitalist to admit for sugar not controlled despite insulin drip per Glucose Stabilizer. Have recommended inpatient oral surgery or dental consult.          Hanley Seamen, MD 03/20/13 [phone]

## 2013-03-20 ENCOUNTER — Emergency Department (HOSPITAL_BASED_OUTPATIENT_CLINIC_OR_DEPARTMENT_OTHER): Payer: Medicaid Other

## 2013-03-20 ENCOUNTER — Encounter (HOSPITAL_COMMUNITY): Payer: Self-pay | Admitting: Internal Medicine

## 2013-03-20 ENCOUNTER — Telehealth: Payer: Self-pay | Admitting: Family Medicine

## 2013-03-20 DIAGNOSIS — E1140 Type 2 diabetes mellitus with diabetic neuropathy, unspecified: Secondary | ICD-10-CM

## 2013-03-20 DIAGNOSIS — K047 Periapical abscess without sinus: Secondary | ICD-10-CM | POA: Diagnosis present

## 2013-03-20 DIAGNOSIS — R7309 Other abnormal glucose: Secondary | ICD-10-CM

## 2013-03-20 DIAGNOSIS — E860 Dehydration: Secondary | ICD-10-CM | POA: Diagnosis present

## 2013-03-20 DIAGNOSIS — R739 Hyperglycemia, unspecified: Secondary | ICD-10-CM | POA: Diagnosis present

## 2013-03-20 LAB — CREATININE, SERUM
Creatinine, Ser: 0.91 mg/dL (ref 0.50–1.35)
GFR calc non Af Amer: >90 mL/min (ref >90)

## 2013-03-20 LAB — CBC
HCT: 31 % — ABNORMAL LOW (ref 39.0–52.0)
Hemoglobin: 10.5 g/dL — ABNORMAL LOW (ref 13.0–17.0)
MCH: 27.3 pg (ref 26.0–34.0)
MCHC: 33.9 g/dL (ref 30.0–36.0)
RBC: 3.85 MIL/uL — ABNORMAL LOW (ref 4.22–5.81)

## 2013-03-20 LAB — RAPID URINE DRUG SCREEN, HOSP PERFORMED
Opiates: NOT DETECTED
Tetrahydrocannabinol: NOT DETECTED

## 2013-03-20 LAB — GLUCOSE, CAPILLARY
Glucose-Capillary: 350 mg/dL — ABNORMAL HIGH (ref 70–99)
Glucose-Capillary: 282 mg/dL — ABNORMAL HIGH (ref 70–99)
Glucose-Capillary: 288 mg/dL — ABNORMAL HIGH (ref 70–99)
Glucose-Capillary: 226 mg/dL — ABNORMAL HIGH (ref 70–99)

## 2013-03-20 MED ORDER — PREGABALIN 50 MG PO CAPS
75.0000 mg | ORAL_CAPSULE | Freq: Three times a day (TID) | ORAL | Status: DC
  Administered 2013-03-20: 75 mg via ORAL
  Filled 2013-03-20: qty 1

## 2013-03-20 MED ORDER — ONDANSETRON HCL 4 MG PO TABS: 4.0000 mg | ORAL_TABLET | Freq: Four times a day (QID) | ORAL | Status: DC | PRN

## 2013-03-20 MED ORDER — OXYCODONE HCL 5 MG PO TABS: 5.0000 mg | ORAL_TABLET | Freq: Four times a day (QID) | ORAL | Status: DC | PRN

## 2013-03-20 MED ORDER — ZOLPIDEM TARTRATE 5 MG PO TABS: 10.0000 mg | ORAL_TABLET | Freq: Every evening | ORAL | Status: DC | PRN

## 2013-03-20 MED ORDER — INSULIN ASPART PROT & ASPART (70-30 MIX) 100 UNIT/ML ~~LOC~~ SUSP
SUBCUTANEOUS | Status: AC
  Filled 2013-03-20: qty 3

## 2013-03-20 MED ORDER — HYDROMORPHONE HCL PF 1 MG/ML IJ SOLN
1.0000 mg | INTRAMUSCULAR | Status: DC | PRN
  Administered 2013-03-20: 1 mg via INTRAVENOUS
  Filled 2013-03-20: qty 1

## 2013-03-20 MED ORDER — PANTOPRAZOLE SODIUM 40 MG PO TBEC
40.0000 mg | DELAYED_RELEASE_TABLET | Freq: Every day | ORAL | Status: DC
  Administered 2013-03-20: 40 mg via ORAL
  Filled 2013-03-20: qty 1

## 2013-03-20 MED ORDER — SODIUM CHLORIDE 0.9 % IV SOLN
INTRAVENOUS | Status: DC
  Administered 2013-03-20: 06:00:00 via INTRAVENOUS

## 2013-03-20 MED ORDER — OXYCODONE-ACETAMINOPHEN 5-325 MG PO TABS
1.0000 | ORAL_TABLET | Freq: Four times a day (QID) | ORAL | Status: DC | PRN
  Administered 2013-03-20: 1 via ORAL
  Filled 2013-03-20: qty 1

## 2013-03-20 MED ORDER — INSULIN ASPART 100 UNIT/ML ~~LOC~~ SOLN
0.0000 [IU] | SUBCUTANEOUS | Status: DC
  Administered 2013-03-20: 8 [IU] via SUBCUTANEOUS

## 2013-03-20 MED ORDER — IOHEXOL 300 MG/ML  SOLN
80.0000 mL | Freq: Once | INTRAMUSCULAR | Status: AC | PRN
  Administered 2013-03-20: 80 mL via INTRAVENOUS

## 2013-03-20 MED ORDER — FENTANYL CITRATE 0.05 MG/ML IJ SOLN
100.0000 ug | Freq: Once | INTRAMUSCULAR | Status: AC
  Administered 2013-03-20: 100 ug via INTRAVENOUS
  Filled 2013-03-20: qty 2

## 2013-03-20 MED ORDER — OXYCODONE-ACETAMINOPHEN 10-325 MG PO TABS: 1.0000 | ORAL_TABLET | Freq: Four times a day (QID) | ORAL | Status: DC | PRN

## 2013-03-20 MED ORDER — ONDANSETRON HCL 4 MG/2ML IJ SOLN: 4.0000 mg | Freq: Three times a day (TID) | INTRAMUSCULAR | Status: DC | PRN

## 2013-03-20 MED ORDER — INSULIN GLARGINE 100 UNIT/ML ~~LOC~~ SOLN
20.0000 [IU] | Freq: Once | SUBCUTANEOUS | Status: AC
  Administered 2013-03-20: 20 [IU] via SUBCUTANEOUS
  Filled 2013-03-20: qty 0.2

## 2013-03-20 MED ORDER — MUPIROCIN CALCIUM 2 % EX CREA
TOPICAL_CREAM | Freq: Every day | CUTANEOUS | Status: DC
  Filled 2013-03-20: qty 15

## 2013-03-20 MED ORDER — DEXTROSE-NACL 5-0.9 % IV SOLN
INTRAVENOUS | Status: DC
  Administered 2013-03-20: 07:00:00 via INTRAVENOUS

## 2013-03-20 MED ORDER — DOCUSATE SODIUM 100 MG PO CAPS
100.0000 mg | ORAL_CAPSULE | Freq: Two times a day (BID) | ORAL | Status: DC
  Administered 2013-03-20: 100 mg via ORAL
  Filled 2013-03-20 (×2): qty 1

## 2013-03-20 MED ORDER — CLINDAMYCIN HCL 300 MG PO CAPS: 600.0000 mg | ORAL_CAPSULE | Freq: Three times a day (TID) | ORAL | Status: DC

## 2013-03-20 MED ORDER — CLINDAMYCIN PHOSPHATE 600 MG/50ML IV SOLN
600.0000 mg | Freq: Once | INTRAVENOUS | Status: AC
  Administered 2013-03-20: 600 mg via INTRAVENOUS
  Filled 2013-03-20: qty 50

## 2013-03-20 MED ORDER — PREGABALIN 50 MG PO CAPS: 75.0000 mg | ORAL_CAPSULE | Freq: Every day | ORAL | Status: DC

## 2013-03-20 MED ORDER — SACCHAROMYCES BOULARDII 250 MG PO CAPS
250.0000 mg | ORAL_CAPSULE | Freq: Two times a day (BID) | ORAL | Status: DC
  Administered 2013-03-20: 250 mg via ORAL
  Filled 2013-03-20 (×2): qty 1

## 2013-03-20 MED ORDER — INSULIN GLARGINE 100 UNIT/ML ~~LOC~~ SOLN
20.0000 [IU] | Freq: Every day | SUBCUTANEOUS | Status: DC
  Filled 2013-03-20: qty 0.2

## 2013-03-20 MED ORDER — CLINDAMYCIN PHOSPHATE 600 MG/50ML IV SOLN
600.0000 mg | Freq: Three times a day (TID) | INTRAVENOUS | Status: DC
  Administered 2013-03-20: 600 mg via INTRAVENOUS
  Filled 2013-03-20 (×2): qty 50

## 2013-03-20 MED ORDER — HEPARIN SODIUM (PORCINE) 5000 UNIT/ML IJ SOLN
5000.0000 [IU] | Freq: Three times a day (TID) | INTRAMUSCULAR | Status: DC
  Administered 2013-03-20: 5000 [IU] via SUBCUTANEOUS
  Filled 2013-03-20 (×4): qty 1

## 2013-03-20 MED ORDER — INSULIN ASPART 100 UNIT/ML ~~LOC~~ SOLN
1.0000 [IU] | Freq: Three times a day (TID) | SUBCUTANEOUS | Status: DC
  Administered 2013-03-20: 5 [IU] via SUBCUTANEOUS

## 2013-03-20 MED ORDER — ONDANSETRON HCL 4 MG/2ML IJ SOLN: 4.0000 mg | Freq: Four times a day (QID) | INTRAMUSCULAR | Status: DC | PRN

## 2013-03-20 MED ORDER — SODIUM CHLORIDE 0.9 % IV BOLUS (SEPSIS)
1000.0000 mL | Freq: Once | INTRAVENOUS | Status: AC
  Administered 2013-03-20: 1000 mL via INTRAVENOUS

## 2013-03-20 MED ORDER — INSULIN REGULAR HUMAN 100 UNIT/ML IJ SOLN
INTRAMUSCULAR | Status: AC
  Filled 2013-03-20: qty 1

## 2013-03-20 NOTE — Care Management Note (Unsigned)
    Page 1 of 1   03/20/2013     10:32:31 AM   CARE MANAGEMENT NOTE 03/20/2013  Patient:  Jason [name], Jason [name]   Account Number:  [id number]  Date Initiated:  03/20/2013  Documentation initiated by:  GRAVES-BIGELOW,Margie Urbanowicz  Subjective/Objective Assessment:   Pt admitted with dental pain. Pt is not working at this time an lives in [place]. Pt states he filled out medicaid application 2 months ago and is awaiting results. Pt does not have PCP.     Action/Plan:   CM did place a call to Dr. Yancey Flemings and ofice visit, tooth extraction and f/u visit will be 200.00. CM did call [facility] pt will have to f/u with them and they do have a dental clinic. Other resources given to pt for PCP needs.   Anticipated DC Date:  03/20/2013   Anticipated DC Plan:  HOME/SELF CARE      DC Planning Services  CM consult      Choice offered to / List presented to:             Status of service:  In process, will continue to follow Medicare Important Message given?   (If response is "NO", the following Medicare IM given date fields will be blank) Date Medicare IM given:   Date Additional Medicare IM given:    Discharge Disposition:    Per UR Regulation:  Reviewed for med. necessity/level of care/duration of stay  If discussed at Long Length of Stay Meetings, dates discussed:    Comments:  03-20-13 [address], [place] [phone] Diabetes Coordinator to f/u with pt before d/c. Medication- pt will need to be d/c on the cheapest insulin possible. CM will f/u.

## 2013-03-20 NOTE — H&P (Signed)
[facility] History and Physical  Jason [name] RUE:[id number] DOB: Nov 30, 1985    PCP:   Nelwyn Salisbury, MD   Chief Complaint: tooth pain.  HPI: Jason [name] is an 27 y.o. male with hx of poor dental condition, noncompliance to medication, DM1 on Insulin (32 units Lantus q hs with sliding meal coverage), chronic pain from neuropathy on PRN narcotics, presents to the [facility] with left upper jaw tooth pain.  He had it for 3 days.  He also has not been able to eat, and thus skipped his insulin.  He also has increased neuropathic pain.  Evaluation in the ER showed he was in hyperglycemic nonketotic state, with Bicarb of 29, BS of 400's, and normal renal function tests.  His Na was 134.  He was started on insulin drip with good result of his BS to 240.  He received Clindamycin and was transferred here for further treatment and oral surgeon consultation later today.  Rewiew of Systems:  Constitutional: Negative for malaise, fever and chills. No significant weight loss or weight gain Eyes: Negative for eye pain, redness and discharge, diplopia, visual changes, or flashes of light. ENMT: Negative for ear pain, hoarseness, nasal congestion, sinus pressure and sore throat. No headaches; tinnitus, drooling, or problem swallowing. Cardiovascular: Negative for chest pain, palpitations, diaphoresis, dyspnea and peripheral edema. ; No orthopnea, PND Respiratory: Negative for cough, hemoptysis, wheezing and stridor. No pleuritic chestpain. Gastrointestinal: Negative for nausea, vomiting, diarrhea, constipation, abdominal pain, melena, blood in stool, hematemesis, jaundice and rectal bleeding.    Genitourinary: Negative for frequency, dysuria, incontinence,flank pain and hematuria; Musculoskeletal: Negative for back pain and neck pain. Negative for swelling and trauma.;  Skin: . Negative for pruritus, rash, abrasions, bruising and skin lesion.; ulcerations Neuro: Negative for headache,  lightheadedness and neck stiffness. Negative for weakness, altered level of consciousness , altered mental status, extremity weakness,  involuntary movement, seizure and syncope.  Psych: negative for anxiety, depression, insomnia, tearfulness, panic attacks, hallucinations, paranoia, suicidal or homicidal ideation    Past Medical History  Diagnosis Date  . Diabetes mellitus   . GERD (gastroesophageal reflux disease)   . Asthma   . Hx MRSA infection     on face    Past Surgical History  Procedure Laterality Date  . Tonsillectomy      Medications:  HOME MEDS: Prior to Admission medications   Medication Sig Start Date End Date Taking? Authorizing Provider  clindamycin (CLEOCIN) 300 MG capsule Take 300 mg by mouth 3 (three) times daily. 7 day treatment for leg ulcer    Historical Provider, MD  insulin aspart (NOVOLOG FLEXPEN) 100 UNIT/ML injection Inject 1-17 Units into the skin 4 (four) times daily -  before meals and at bedtime. 01/30/13   Tora Kindred York, PA-C  insulin glargine (LANTUS) 100 UNIT/ML injection Inject 0.32 mLs (32 Units total) into the skin at bedtime. 01/30/13   Tora Kindred York, PA-C  mupirocin cream (BACTROBAN) 2 % Apply topically daily. To right leg wound 01/30/13   Stephani Police, PA-C  Omeprazole-Sodium Bicarbonate (ZEGERID OTC) 20-1100 MG CAPS Take 1 capsule by mouth at bedtime.     Historical Provider, MD  oxyCODONE-acetaminophen (PERCOCET) 10-325 MG per tablet Take 1 tablet by mouth every 4 (four) hours as needed for pain. 03/09/13   John L Molpus, MD  pregabalin (LYRICA) 75 MG capsule Take 1 capsule (75 mg total) by mouth daily. 01/30/13   Tora Kindred York, PA-C  zolpidem (AMBIEN) 10 MG tablet Take 1  tablet (10 mg total) by mouth at bedtime as needed for sleep. 12/09/12 01/08/13  Nelwyn Salisbury, MD  zolpidem (AMBIEN) 10 MG tablet Take 10 mg by mouth at bedtime as needed for sleep.    Historical Provider, MD     Allergies:  Allergies  Allergen Reactions  . Cefuroxime  Axetil Anaphylaxis  . Penicillins Anaphylaxis  . Tessalon (Benzonatate) Anaphylaxis    Can take amoxicillin  . Shellfish Allergy Itching    Social History:   reports that he has been smoking Cigars.  He has never used smokeless tobacco. He reports that he does not drink alcohol or use illicit drugs.  Family History: Family History  Problem Relation Age of Onset  . Diabetes Father   . Asthma      fhx  . Hypertension      fhx  . Stroke      fhx     Physical Exam: Filed Vitals:   03/20/13 0206 03/20/13 0332 03/20/13 0427 03/20/13 0514  BP:  115/74 109/67 148/97  Pulse:      Temp: 98.4 F (36.9 C)   98.9 F (37.2 C)  TempSrc: Oral   Oral  Resp:    18  Height:    5\' 8"  (1.727 m)  Weight:    59.693 kg (131 lb 9.6 oz)  SpO2: 93%  95% 100%   Blood pressure 148/97, pulse 117, temperature 98.9 F (37.2 C), temperature source Oral, resp. rate 18, height 5\' 8"  (1.727 m), weight 59.693 kg (131 lb 9.6 oz), SpO2 100.00%.  GEN:  Pleasant  patient lying in the stretcher in no acute distress; cooperative with exam. PSYCH:  alert and oriented x4; does not appear anxious or depressed; affect is appropriate. HEENT: Mucous membranes pink and anicteric; PERRLA; EOM intact; no cervical lymphadenopathy nor thyromegaly or carotid bruit; no JVD; There were no stridor. Neck is very supple.  He has left upper molar swelling, and look like impacted right lower molar as well. Breasts:: Not examined CHEST WALL: No tenderness CHEST: Normal respiration, clear to auscultation bilaterally.  HEART: Regular rate and rhythm.  There are no murmur, rub, or gallops.   BACK: No kyphosis or scoliosis; no CVA tenderness ABDOMEN: soft and non-tender; no masses, no organomegaly, normal abdominal bowel sounds; no pannus; no intertriginous candida. There is no rebound and no distention. Rectal Exam: Not done EXTREMITIES: No bone or joint deformity; age-appropriate arthropathy of the hands and knees; no edema; no  ulcerations.  There is no calf tenderness. Genitalia: not examined PULSES: 2+ and symmetric SKIN: Normal hydration no rash or ulceration CNS: Cranial nerves 2-12 grossly intact no focal lateralizing neurologic deficit.  Speech is fluent; uvula elevated with phonation, facial symmetry and tongue midline. DTR are normal bilaterally, cerebella exam is intact, barbinski is negative and strengths are equaled bilaterally.  No sensory loss.   Labs on Admission:  Basic Metabolic Panel:  Recent Labs Lab 03/19/13 2254  NA 134*  K 4.1  CL 90*  CO2 29  GLUCOSE 500*  BUN 9  CREATININE 1.20  CALCIUM 9.7   Liver Function Tests: No results found for this basename: AST, ALT, ALKPHOS, BILITOT, PROT, ALBUMIN,  in the last 168 hours No results found for this basename: LIPASE, AMYLASE,  in the last 168 hours No results found for this basename: AMMONIA,  in the last 168 hours CBC:  Recent Labs Lab 03/19/13 2254  WBC 7.7  NEUTROABS 4.8  HGB 12.3*  HCT 35.6*  MCV 81.3  PLT 342   Cardiac Enzymes: No results found for this basename: CKTOTAL, CKMB, CKMBINDEX, TROPONINI,  in the last 168 hours  CBG:  Recent Labs Lab 03/19/13 2228 03/20/13 0001 03/20/13 0125 03/20/13 0244 03/20/13 0413  GLUCAP 471* 429* 350* 282* 248*     Radiological Exams on Admission: Dg Chest 2 View  03/20/2013   *RADIOLOGY REPORT*  Clinical Data: Complaint of fever, neuropathy, and dental pain.  CHEST - 2 VIEW  Comparison: 02/15/2010.  Findings: The heart size and pulmonary vascularity are normal. The lungs appear clear and expanded without focal air space disease or consolidation. No blunting of the costophrenic angles.  No pneumothorax.  Mediastinal contours appear intact.  No significant changes since the previous study.  IMPRESSION: No evidence of active pulmonary disease.   Original Report Authenticated By: Burman Nieves, M.D.   Ct Maxillofacial W/cm  03/20/2013   *RADIOLOGY REPORT*  Clinical Data: Left upper  mouth pain.  CT MAXILLOFACIAL WITH CONTRAST  Technique:  Multidetector CT imaging of the maxillofacial structures was performed with intravenous contrast. Multiplanar CT image reconstructions were also generated.  Contrast: 80mL OMNIPAQUE IOHEXOL 300 MG/ML  SOLN  Comparison: None.  Findings: Multiple dental caries are demonstrated bilaterally. Peri apical lucencies are suggested at the left upper second molar suggesting periodontal disease.  No discrete abscess is demonstrated.  No abnormal soft tissue and contrast enhancement or fluid collection.  Mucosal membrane thickening in the left maxillary antrum and bilateral ethmoid air cells consistent with chronic inflammation.  No acute air-fluid levels are demonstrated in the paranasal sinuses.  Prominent level II lymph nodes are demonstrated on the left consistent with reactive nodes. Visualized orbital and facial bones appear intact without displaced fracture identified.  Deformity of the nasal bones suggest old fracture deformity.  Visualized cervical carotid and jugular vessels are patent.  Salivary glands appear symmetrical.  No displacement of the cervical fat planes.  No prevertebral soft tissue swelling or mucosal infiltration is demonstrated.  IMPRESSION: Poor dentition with multiple dental caries.  Peri apical lucency around the left second molar.  No discrete abscess is demonstrated. Chronic inflammatory changes in the paranasal sinuses.   Original Report Authenticated By: Burman Nieves, M.D.    Assessment/Plan Present on Admission:  . Dental abscess . Hyperglycemia . Dehydration . Tobacco abuse   PLAN:  He no longer needs IV insulin drip.  I will give him D5NS (He is NPO) and decrease Lantus from 32 to 20 q hs with ISS q4 hours PRN.  For his dental abcess, will continue IV Clindamycin with Florestor.  Please consult oral surgeon or dentist for further recommendation.  He is stable, full code, and will be admitted to [facility] service.  Thank you for  allowing me to partake in the care of this nice patient.          Other plans as per orders.  Code Status: FULL Unk Lightning, MD. [facility] Pager [phone] 7pm to 7am.  03/20/2013, 6:44 AM

## 2013-03-20 NOTE — Progress Notes (Signed)
6/13  Spoke with patient about his diabetes.  Was diagnosed at age 27.  Takes Lantus 32 units every HS and Novolog 1-17 units sliding scale TID and HS at home.  States that he can get insulin financially without problems.  Has applied for Medicaid.  Had not taken his Lantus yesterday because he was not eating.  Had been drinking Gatorade yesterday afternoon which helped to increase blood sugars, along with infection in tooth.  Stressed the importance of taking at least half of his insulin dosage of Lantus when not able to eat, and to check blood sugars every 4 hours and treat accordingly. Spoke with Dr. Mahala Menghini and asked if patient could get a dose of Lantus now since he did not receive any when coming off the stabilizer earlier today. Verbal order from Dr. Mahala Menghini entered by staff nurse for one time order of Lantus 20 units to be given now. Case management spoke with patient about clinics to call for dental care.  Patient has [facility] a physician [facility] for [facility]. Will continue to follow while in hospital.   Smith Mince RN BSN CDE

## 2013-03-20 NOTE — Discharge Summary (Signed)
Physician Discharge Summary  Jason [name] ZOX:[id number] DOB: December 31, 1985 DOA: 03/19/2013  PCP: Nelwyn Salisbury, MD  Admit date: 03/19/2013 Discharge date: 03/20/2013  Time spent: 15 minutes  Recommendations for Outpatient Follow-up:  1. Recommended to followup with the dental surgeon for abscess Start clindamycin 6/13 2014, date of completion 6/27 2014  Discharge Diagnoses:  Principal Problem:   Dental abscess Active Problems:   Tobacco abuse   Hyperglycemia   Dehydration   Painful diabetic neuropathy   Discharge Condition: Good  Diet recommendation: Diabetic  Filed Weights   03/20/13 0514  Weight: 59.693 kg (131 lb 9.6 oz)    History of present illness:  27 year old male with type 1 diabetes mellitus from H8 percent to [facility] with dental abscess.  Blood sugar is noted to be 417 he is not been taking his insulin as a result of that. He states the pain is pretty bad and it's been giving him headaches and caused him to vomit. Patient admitted transiently drink hospital stay and started on insulin drip and blood sugars reached to 40. He was given IV clindamycin 2 dosages and was insistent on being discharged home. He's been given a prescription for 7-8 tablets clindamycin patient [facility]/03/[facility] Dr. Yancey Flemings and ofice visit, tooth extraction and f/u visit will be 200.00-H. and is aware of the same and will followup for further care there Patient was discharged from  Discharge Exam: Filed Vitals:   03/20/13 0206 03/20/13 0332 03/20/13 0427 03/20/13 0514  BP:  115/74 109/67 148/97  Pulse:      Temp: 98.4 F (36.9 C)   98.9 F (37.2 C)  TempSrc: Oral   Oral  Resp:    18  Height:    5\' 8"  (1.727 m)  Weight:    59.693 kg (131 lb 9.6 oz)  SpO2: 93%  95% 100%   Alert pleasant oriented no apparent distress  General: EOMI, very poor dentition Cardiovascular: S1-S2 no murmur rub or gallop Respiratory: Clinically clear  Discharge  Instructions  Discharge Orders   Future Orders Complete By Expires     Diet - low sodium heart healthy  As directed     Increase activity slowly  As directed         Medication List    STOP taking these medications       pregabalin 75 MG capsule  Commonly known as:  LYRICA      TAKE these medications       clindamycin 300 MG capsule  Commonly known as:  CLEOCIN  Take 2 capsules (600 mg total) by mouth 3 (three) times daily.     insulin aspart 100 UNIT/ML injection  Commonly known as:  NOVOLOG FLEXPEN  Inject 1-17 Units into the skin 4 (four) times daily -  before meals and at bedtime.     insulin glargine 100 UNIT/ML injection  Commonly known as:  LANTUS  Inject 0.32 mLs (32 Units total) into the skin at bedtime.     oxyCODONE-acetaminophen 10-325 MG per tablet  Commonly known as:  PERCOCET  Take 1 tablet by mouth every 4 (four) hours as needed for pain.     ZEGERID OTC 20-1100 MG Caps  Generic drug:  Omeprazole-Sodium Bicarbonate  Take 1 capsule by mouth at bedtime.     zolpidem 10 MG tablet  Commonly known as:  AMBIEN  Take 10 mg by mouth at bedtime as needed for sleep.       Allergies  Allergen Reactions  . Cefuroxime Axetil Anaphylaxis  . Penicillins Anaphylaxis  . Tessalon (Benzonatate) Anaphylaxis    Can take amoxicillin  . Shellfish Allergy Itching      The results of significant diagnostics from this hospitalization (including imaging, microbiology, ancillary and laboratory) are listed below for reference.    Significant Diagnostic Studies: Dg Chest 2 View  03/20/2013   *RADIOLOGY REPORT*  Clinical Data: Complaint of fever, neuropathy, and dental pain.  CHEST - 2 VIEW  Comparison: 02/15/2010.  Findings: The heart size and pulmonary vascularity are normal. The lungs appear clear and expanded without focal air space disease or consolidation. No blunting of the costophrenic angles.  No pneumothorax.  Mediastinal contours appear intact.  No significant  changes since the previous study.  IMPRESSION: No evidence of active pulmonary disease.   Original Report Authenticated By: Burman Nieves, M.D.   Ct Maxillofacial W/cm  03/20/2013   *RADIOLOGY REPORT*  Clinical Data: Left upper mouth pain.  CT MAXILLOFACIAL WITH CONTRAST  Technique:  Multidetector CT imaging of the maxillofacial structures was performed with intravenous contrast. Multiplanar CT image reconstructions were also generated.  Contrast: 80mL OMNIPAQUE IOHEXOL 300 MG/ML  SOLN  Comparison: None.  Findings: Multiple dental caries are demonstrated bilaterally. Peri apical lucencies are suggested at the left upper second molar suggesting periodontal disease.  No discrete abscess is demonstrated.  No abnormal soft tissue and contrast enhancement or fluid collection.  Mucosal membrane thickening in the left maxillary antrum and bilateral ethmoid air cells consistent with chronic inflammation.  No acute air-fluid levels are demonstrated in the paranasal sinuses.  Prominent level II lymph nodes are demonstrated on the left consistent with reactive nodes. Visualized orbital and facial bones appear intact without displaced fracture identified.  Deformity of the nasal bones suggest old fracture deformity.  Visualized cervical carotid and jugular vessels are patent.  Salivary glands appear symmetrical.  No displacement of the cervical fat planes.  No prevertebral soft tissue swelling or mucosal infiltration is demonstrated.  IMPRESSION: Poor dentition with multiple dental caries.  Peri apical lucency around the left second molar.  No discrete abscess is demonstrated. Chronic inflammatory changes in the paranasal sinuses.   Original Report Authenticated By: Burman Nieves, M.D.    Microbiology: Recent Results (from the past 240 hour(s))  MRSA PCR SCREENING     Status: None   Collection Time    03/20/13  5:21 AM      Result Value Range Status   MRSA by PCR NEGATIVE  NEGATIVE Final   Comment:             The GeneXpert MRSA Assay (FDA     approved for NASAL specimens     only), is one component of a     comprehensive MRSA colonization     surveillance program. It is not     intended to diagnose MRSA     infection nor to guide or     monitor treatment for     MRSA infections.     Labs: Basic Metabolic Panel:  Recent Labs Lab 03/19/13 2254 03/20/13 0817  NA 134*  --   K 4.1  --   CL 90*  --   CO2 29  --   GLUCOSE 500*  --   BUN 9  --   CREATININE 1.20 0.91  CALCIUM 9.7  --    Liver Function Tests: No results found for this basename: AST, ALT, ALKPHOS, BILITOT, PROT, ALBUMIN,  in the last 168 hours  No results found for this basename: LIPASE, AMYLASE,  in the last 168 hours No results found for this basename: AMMONIA,  in the last 168 hours CBC:  Recent Labs Lab 03/19/13 2254 03/20/13 0817  WBC 7.7 11.4*  NEUTROABS 4.8  --   HGB 12.3* 10.5*  HCT 35.6* 31.0*  MCV 81.3 80.5  PLT 342 316   Cardiac Enzymes: No results found for this basename: CKTOTAL, CKMB, CKMBINDEX, TROPONINI,  in the last 168 hours BNP: BNP (last 3 results) No results found for this basename: PROBNP,  in the last 8760 hours CBG:  Recent Labs Lab 03/20/13 0125 03/20/13 0244 03/20/13 0413 03/20/13 0738 03/20/13 1146  GLUCAP 350* 282* 248* 288* 226*       Signed:  Rhetta Mura  [facility] 03/20/2013, 12:11 PM

## 2013-03-20 NOTE — Telephone Encounter (Signed)
[facility] Triage Call Report Triage Record Num: [id number] Operator: Maryfrances Bunnell Patient Name: Jason [name] Call Date & Time: 03/19/2013 7:53:10PM Patient Phone: [phone] PCP: Tera Mater. Clent Ridges Patient Gender: Male PCP Fax : [phone] Patient DOB: 1985/11/18 Practice Name: [facility] Reason for Call: Caller: [name]/Significant Other; PCP: [facility]); CB#: [phone]; Call regarding Broke tooth; unable to swallow; Chest pain to mid sternal area towards right onset 1950 tonight, only hurts when he tries to swallow; Pain feels almost like something is stuck in throat or chest; Calling also due to sore inside mouth noted this am 03/19/13; Has been gargling with Listerine and warm salt water with no improvement; Area is swollen; painful; upper left jaw teeth hurt; No drainage; Has a bad taste in mouth; Jaw is slightly puffy; Afebrile; Per Swallowing Difficulty Protocol "Something seems to be stuck and not choking" Advised to see ed; Care advice given; Will see [facility]. Protocol(s) Used: Swallowing Difficulty Recommended Outcome per Protocol: See ED Immediately Reason for Outcome: Something seems to be stuck AND not choking Care Advice: ~ Another adult should drive. ~ Do not give the patient anything to eat or drink. Call EMS 911 if sudden onset or sudden worsening of breathing problems, struggling to breathe, high pitched noise when breathing in (stridor), unable to speak, grasping at throat, or panic/anxiety because of breathing problems. ~ ~ Sit upright or raise head with pillows. ~ IMMEDIATE ACTION ~ CAUTIONS Write down provider's name. List or place the following in a bag for transport with the patient: current prescription and/or nonprescription medications; alternative treatments, therapies and medications; and street drugs. ~ 06/

## 2013-03-27 ENCOUNTER — Telehealth: Payer: Self-pay | Admitting: Family Medicine

## 2013-03-27 NOTE — Telephone Encounter (Signed)
Patient Information:  Caller Name: [facility]  Phone: [phone]  Patient: Jason [name], Jason [name]  Gender: Male  DOB: [birth year]  Age: 27 Years  PCP: [facility])  Office Follow Up:  Does the office need to follow up with this patient?: No  Instructions For The Office: N/A  RN Note:  Not lightheaded today but had some lightheadedness 03/26/13. IDDM with mild vomiting X 48 hours.  Reports constant right abdominal pain at level of umbilicus since 03/25/13.  Agreed to go to [facility] ED now.  Symptoms  Reason For Call & Symptoms: Emergent call:  Vomiting when drinks fluids; vomited twice so far today.  Voided at 0900; volume was less than normal; color darker than usual. Vomited once yesterday evening. No diarrhea. FBS 155 at 0900.  Reviewed Health History In EMR: Yes  Reviewed Medications In EMR: Yes  Reviewed Allergies In EMR: Yes  Reviewed Surgeries / Procedures: Yes  Date of Onset of Symptoms: 03/25/2013  Guideline(s) Used:  Vomiting  Disposition Per Guideline:   Go to ED Now (or to Office with PCP Approval)  Reason For Disposition Reached:   Constant abdominal pain lasting > 2 hours  Advice Given:  N/A  RN Overrode Recommendation:  Go To ED  [facility]

## 2013-03-28 ENCOUNTER — Encounter (HOSPITAL_BASED_OUTPATIENT_CLINIC_OR_DEPARTMENT_OTHER): Payer: Self-pay | Admitting: *Deleted

## 2013-03-28 ENCOUNTER — Emergency Department (HOSPITAL_BASED_OUTPATIENT_CLINIC_OR_DEPARTMENT_OTHER)
Admission: EM | Admit: 2013-03-28 | Discharge: 2013-03-28 | Disposition: A | Discharge status: Home / Self Care | Payer: Medicaid Other | Attending: Emergency Medicine | Admitting: Emergency Medicine

## 2013-03-28 DIAGNOSIS — Z794 Long term (current) use of insulin: Secondary | ICD-10-CM | POA: Insufficient documentation

## 2013-03-28 DIAGNOSIS — Z8614 Personal history of Methicillin resistant Staphylococcus aureus infection: Secondary | ICD-10-CM | POA: Insufficient documentation

## 2013-03-28 DIAGNOSIS — F17200 Nicotine dependence, unspecified, uncomplicated: Secondary | ICD-10-CM | POA: Diagnosis not present

## 2013-03-28 DIAGNOSIS — M545 Low back pain, unspecified: Secondary | ICD-10-CM | POA: Insufficient documentation

## 2013-03-28 DIAGNOSIS — R1110 Vomiting, unspecified: Secondary | ICD-10-CM | POA: Insufficient documentation

## 2013-03-28 DIAGNOSIS — E1142 Type 2 diabetes mellitus with diabetic polyneuropathy: Secondary | ICD-10-CM | POA: Insufficient documentation

## 2013-03-28 DIAGNOSIS — IMO0002 Reserved for concepts with insufficient information to code with codable children: Secondary | ICD-10-CM | POA: Insufficient documentation

## 2013-03-28 DIAGNOSIS — J45909 Unspecified asthma, uncomplicated: Secondary | ICD-10-CM | POA: Insufficient documentation

## 2013-03-28 DIAGNOSIS — E1149 Type 2 diabetes mellitus with other diabetic neurological complication: Secondary | ICD-10-CM | POA: Insufficient documentation

## 2013-03-28 DIAGNOSIS — Z79899 Other long term (current) drug therapy: Secondary | ICD-10-CM | POA: Insufficient documentation

## 2013-03-28 DIAGNOSIS — R1013 Epigastric pain: Secondary | ICD-10-CM | POA: Diagnosis present

## 2013-03-28 DIAGNOSIS — Z880 Allergy status to penicillin: Secondary | ICD-10-CM | POA: Insufficient documentation

## 2013-03-28 DIAGNOSIS — K3184 Gastroparesis: Secondary | ICD-10-CM | POA: Insufficient documentation

## 2013-03-28 DIAGNOSIS — K219 Gastro-esophageal reflux disease without esophagitis: Secondary | ICD-10-CM | POA: Insufficient documentation

## 2013-03-28 DIAGNOSIS — E1143 Type 2 diabetes mellitus with diabetic autonomic (poly)neuropathy: Secondary | ICD-10-CM

## 2013-03-28 LAB — CBC WITH DIFFERENTIAL/PLATELET
Basophils Absolute: 0.1 10*3/uL (ref 0.0–0.1)
HCT: 33.6 % — ABNORMAL LOW (ref 39.0–52.0)
Lymphocytes Relative: 26 % (ref 12–46)
Monocytes Absolute: 1.6 10*3/uL — ABNORMAL HIGH (ref 0.1–1.0)
Neutro Abs: 6 10*3/uL (ref 1.7–7.7)
Neutrophils Relative %: 55 % (ref 43–77)
RDW: 12.7 % (ref 11.5–15.5)
WBC: 10.9 10*3/uL — ABNORMAL HIGH (ref 4.0–10.5)

## 2013-03-28 LAB — BASIC METABOLIC PANEL
CO2: 30 mEq/L (ref 19–32)
Chloride: 99 mEq/L (ref 96–112)
Creatinine, Ser: 1 mg/dL (ref 0.50–1.35)
GFR calc Af Amer: >90 mL/min (ref >90)
Potassium: 4.1 mEq/L (ref 3.5–5.1)
Sodium: 138 mEq/L (ref 135–145)

## 2013-03-28 LAB — URINALYSIS, ROUTINE W REFLEX MICROSCOPIC
Leukocytes, UA: NEGATIVE
Nitrite: NEGATIVE
Specific Gravity, Urine: >1.046 — ABNORMAL HIGH (ref 1.005–1.030)
Urobilinogen, UA: 1 mg/dL (ref 0.0–1.0)
pH: 8 (ref 5.0–8.0)

## 2013-03-28 LAB — URINE MICROSCOPIC-ADD ON

## 2013-03-28 LAB — LIPASE, BLOOD: Lipase: 10 U/L — ABNORMAL LOW (ref 11–59)

## 2013-03-28 MED ORDER — PANTOPRAZOLE SODIUM 40 MG IV SOLR
40.0000 mg | Freq: Once | INTRAVENOUS | Status: AC
  Administered 2013-03-28: 40 mg via INTRAVENOUS
  Filled 2013-03-28: qty 40

## 2013-03-28 MED ORDER — SODIUM CHLORIDE 0.9 % IV SOLN
INTRAVENOUS | Status: DC
  Administered 2013-03-28: 20:00:00 via INTRAVENOUS

## 2013-03-28 MED ORDER — HYDROMORPHONE HCL PF 2 MG/ML IJ SOLN
2.0000 mg | Freq: Once | INTRAMUSCULAR | Status: AC
  Administered 2013-03-28: 2 mg via INTRAVENOUS
  Filled 2013-03-28: qty 1

## 2013-03-28 MED ORDER — ONDANSETRON HCL 4 MG/2ML IJ SOLN
4.0000 mg | Freq: Once | INTRAMUSCULAR | Status: AC
Start: 2013-03-28 — End: 2013-03-28
  Administered 2013-03-28: 4 mg via INTRAVENOUS
  Filled 2013-03-28: qty 2

## 2013-03-28 NOTE — ED Notes (Signed)
Request for medical records from [facility] sent.

## 2013-03-28 NOTE — ED Notes (Signed)
Davidson MD at bedside. 

## 2013-03-28 NOTE — ED Notes (Signed)
Pt states he was seen earlier at [facility]. Dx'd with gastroparesis and UTI. Sent home, but vomiting and pain has returned. Unable to sit still at triage. Moaning.

## 2013-03-28 NOTE — ED Notes (Signed)
D/c home with ride 

## 2013-03-28 NOTE — ED Provider Notes (Signed)
History    This chart was scribed for Osvaldo Human, MD by Quintella Reichert, ED scribe.  This patient was seen in room MH07/MH07 and the patient's care was started at 7:13 PM.   CSN: [id number]  Arrival date & time 03/28/13  1759      Chief Complaint  Patient presents with  . Abdominal Pain     The history is provided by the patient. No language interpreter was used.    HPI Comments: Jason [name] is a 27 y.o. male with h/o DM and neuropathy who presents to the Emergency Department complaining of constant, severe abdominal pain that began yesterday, with accompanying bilious vomiting and chronic but worsening neuropathic pain to his feet, legs, and back.  Pt was admitted to [facility] last night and diagnosed with gastroparesis.  He was released this morning and his vomiting had subsided on discharge, but he was advised to return to ED if he began vomiting again. He was also prescribed Percocet for his pain, though he notes that he has not yet filled this prescription for financial reasons.  He denies fever, sore throat, SOB, CP, dysuria, rash, weakness, numbness, or any other associated symptoms.  He reports that his blood glucose has been normal.  Pt was also recently discharged from [facility] for an oral abscess and prescribed clindamycin, which he has also not yet filled.  He medicates regularly with Lantus and Novolog.  He is allergic to penicillin.   Past Medical History  Diagnosis Date  . Diabetes mellitus   . GERD (gastroesophageal reflux disease)   . Asthma   . Hx MRSA infection     on face    Past Surgical History  Procedure Laterality Date  . Tonsillectomy      Family History  Problem Relation Age of Onset  . Diabetes Father   . Asthma      fhx  . Hypertension      fhx  . Stroke      fhx    History  Substance Use Topics  . Smoking status: Current Every Day Smoker    Types: Cigars  . Smokeless tobacco: Never Used     Comment: 2 per day  . Alcohol Use: No       Review of Systems  Constitutional: Negative for fever.  HENT: Negative for sore throat.   Respiratory: Negative for shortness of breath.   Cardiovascular: Negative for chest pain.  Gastrointestinal: Positive for vomiting and abdominal pain.  Genitourinary: Negative for dysuria.  Skin: Negative for rash.  Neurological: Negative for weakness and numbness.  All other systems reviewed and are negative.    Allergies  Cefuroxime axetil; Penicillins; Tessalon; and Shellfish allergy  Home Medications   Current Outpatient Rx  Name  Route  Sig  Dispense  Refill  . clindamycin (CLEOCIN) 300 MG capsule   Oral   Take 2 capsules (600 mg total) by mouth 3 (three) times daily.   78 capsule   0   . insulin aspart (NOVOLOG FLEXPEN) 100 UNIT/ML injection   Subcutaneous   Inject 1-17 Units into the skin 4 (four) times daily -  before meals and at bedtime.   1 vial   6   . insulin glargine (LANTUS) 100 UNIT/ML injection   Subcutaneous   Inject 0.32 mLs (32 Units total) into the skin at bedtime.   10 mL   6   . Omeprazole-Sodium Bicarbonate (ZEGERID OTC) 20-1100 MG CAPS   Oral   Take 1  capsule by mouth at bedtime.          Marland Kitchen oxyCODONE-acetaminophen (PERCOCET) 10-325 MG per tablet   Oral   Take 1 tablet by mouth every 4 (four) hours as needed for pain.   20 tablet   0   . zolpidem (AMBIEN) 10 MG tablet   Oral   Take 10 mg by mouth at bedtime as needed for sleep.           BP 132/84  Pulse 76  Temp(Src) 98.9 F (37.2 C) (Oral)  Resp 24  Ht 5\' 8"  (1.727 m)  Wt 140 lb (63.504 kg)  BMI 21.29 kg/m2  SpO2 97%  Physical Exam  Nursing note and vitals reviewed. Constitutional: He is oriented to person, place, and time. He appears well-developed and well-nourished. No distress.  Doubled over with pain  HENT:  Head: Normocephalic and atraumatic.  Right Ear: External ear normal.  Left Ear: External ear normal.  Mild thrush on tongue  Eyes: EOM are normal.  Neck:  Normal range of motion. Neck supple. No tracheal deviation present.  Cardiovascular: Normal rate, regular rhythm and normal heart sounds.   No murmur heard. Pulmonary/Chest: Effort normal and breath sounds normal. No respiratory distress. He has no wheezes. He has no rales.  Abdominal: Soft. Bowel sounds are normal. He exhibits no mass. There is tenderness (Epigastric). There is no rebound.  Musculoskeletal: Normal range of motion. He exhibits tenderness (Lower lumbar region).  Extremities normal  Neurological: He is alert and oriented to person, place, and time. He exhibits normal muscle tone. Coordination normal.  Neurologically intact  Skin: Skin is warm and dry.  Psychiatric: He has a normal mood and affect. His behavior is normal.    ED Course  Procedures (including critical care time)  DIAGNOSTIC STUDIES: Oxygen Saturation is 97% on room air, normal by my interpretation.    COORDINATION OF CARE: 7:18 PM-Discussed treatment plan which includes pain medication, anti-emetics and labs with pt at bedside and pt agreed to plan.   7:21 PM: Records from [facility] reviewed and show that blood-work were normal and abdominal CT was negative.  9:47 PM: Reviewed lab results with pt.  He states that vomiting has subsided and that pain is much less severe and he feels ready to go home.  Cautioned to return to ED for hospitalization if symptoms do not improve.   Results for orders placed during the hospital encounter of 03/28/13  CBC WITH DIFFERENTIAL      Result Value Range   WBC 10.9 (*) 4.0 - 10.5 K/uL   RBC 4.05 (*) 4.22 - 5.81 MIL/uL   Hemoglobin 11.0 (*) 13.0 - 17.0 g/dL   HCT 95.6 (*) 21.3 - 08.6 %   MCV 83.0  78.0 - 100.0 fL   MCH 27.2  26.0 - 34.0 pg   MCHC 32.7  30.0 - 36.0 g/dL   RDW 57.8  46.9 - 62.9 %   Platelets 615 (*) 150 - 400 K/uL   Neutrophils Relative % 55  43 - 77 %   Neutro Abs 6.0  1.7 - 7.7 K/uL   Lymphocytes Relative 26  12 - 46 %   Lymphs Abs 2.8  0.7 - 4.0 K/uL    Monocytes Relative 15 (*) 3 - 12 %   Monocytes Absolute 1.6 (*) 0.1 - 1.0 K/uL   Eosinophils Relative 5  0 - 5 %   Eosinophils Absolute 0.5  0.0 - 0.7 K/uL   Basophils Relative 1  0 - 1 %   Basophils Absolute 0.1  0.0 - 0.1 K/uL  BASIC METABOLIC PANEL      Result Value Range   Sodium 138  135 - 145 mEq/L   Potassium 4.1  3.5 - 5.1 mEq/L   Chloride 99  96 - 112 mEq/L   CO2 30  19 - 32 mEq/L   Glucose, Bld 177 (*) 70 - 99 mg/dL   BUN 8  6 - 23 mg/dL   Creatinine, Ser 1.61  0.50 - 1.35 mg/dL   Calcium 9.6  8.4 - 09.6 mg/dL   GFR calc non Af Amer >90  >90 mL/min   GFR calc Af Amer >90  >90 mL/min  LIPASE, BLOOD      Result Value Range   Lipase 10 (*) 11 - 59 U/L  URINALYSIS, ROUTINE W REFLEX MICROSCOPIC      Result Value Range   Color, Urine YELLOW  YELLOW   APPearance CLEAR  CLEAR   Specific Gravity, Urine >1.046 (*) 1.005 - 1.030   pH 8.0  5.0 - 8.0   Glucose, UA 100 (*) NEGATIVE mg/dL   Hgb urine dipstick MODERATE (*) NEGATIVE   Bilirubin Urine NEGATIVE  NEGATIVE   Ketones, ur 40 (*) NEGATIVE mg/dL   Protein, ur 045 (*) NEGATIVE mg/dL   Urobilinogen, UA 1.0  0.0 - 1.0 mg/dL   Nitrite NEGATIVE  NEGATIVE   Leukocytes, UA NEGATIVE  NEGATIVE  URINE MICROSCOPIC-ADD ON      Result Value Range   Squamous Epithelial / LPF RARE  RARE   WBC, UA 0-2  <3 WBC/hpf   RBC / HPF 3-6  <3 RBC/hpf   Bacteria, UA RARE  RARE   Urine-Other SPERM PRESENT       1. Diabetic gastroparesis     I personally performed the services described in this documentation, which was scribed in my presence. The recorded information has been reviewed and is accurate.  Osvaldo Human, MD     Carleene Cooper III, MD 03/28/13 2157

## 2013-03-28 NOTE — ED Notes (Signed)
Pt attempting to obtain urine sample at this time 

## 2013-03-29 ENCOUNTER — Encounter (HOSPITAL_COMMUNITY): Payer: Self-pay | Admitting: Adult Health

## 2013-03-29 DIAGNOSIS — E1142 Type 2 diabetes mellitus with diabetic polyneuropathy: Secondary | ICD-10-CM | POA: Diagnosis not present

## 2013-03-29 DIAGNOSIS — R1114 Bilious vomiting: Secondary | ICD-10-CM | POA: Diagnosis not present

## 2013-03-29 DIAGNOSIS — E1149 Type 2 diabetes mellitus with other diabetic neurological complication: Secondary | ICD-10-CM | POA: Diagnosis not present

## 2013-03-29 DIAGNOSIS — R109 Unspecified abdominal pain: Secondary | ICD-10-CM | POA: Insufficient documentation

## 2013-03-29 DIAGNOSIS — F17200 Nicotine dependence, unspecified, uncomplicated: Secondary | ICD-10-CM | POA: Diagnosis not present

## 2013-03-29 DIAGNOSIS — J45909 Unspecified asthma, uncomplicated: Secondary | ICD-10-CM | POA: Insufficient documentation

## 2013-03-29 LAB — CBC WITH DIFFERENTIAL/PLATELET
Basophils Relative: 1 % (ref 0–1)
Eosinophils Absolute: 0.4 10*3/uL (ref 0.0–0.7)
Hemoglobin: 10.6 g/dL — ABNORMAL LOW (ref 13.0–17.0)
MCH: 26.8 pg (ref 26.0–34.0)
MCHC: 33.5 g/dL (ref 30.0–36.0)
Monocytes Relative: 13 % — ABNORMAL HIGH (ref 3–12)
Neutro Abs: 4.6 10*3/uL (ref 1.7–7.7)
Neutrophils Relative %: 49 % (ref 43–77)
Platelets: 639 10*3/uL — ABNORMAL HIGH (ref 150–400)
RBC: 3.96 MIL/uL — ABNORMAL LOW (ref 4.22–5.81)

## 2013-03-29 LAB — GLUCOSE, CAPILLARY: Glucose-Capillary: 221 mg/dL — ABNORMAL HIGH (ref 70–99)

## 2013-03-29 MED ORDER — ONDANSETRON 4 MG PO TBDP
ORAL_TABLET | ORAL | Status: AC
  Filled 2013-03-29: qty 2

## 2013-03-29 MED ORDER — ONDANSETRON 4 MG PO TBDP
8.0000 mg | ORAL_TABLET | Freq: Once | ORAL | Status: AC
  Administered 2013-03-29: 8 mg via ORAL

## 2013-03-29 NOTE — ED Notes (Signed)
CBG checked 221

## 2013-03-29 NOTE — ED Notes (Addendum)
Presents with abdominal pain, bilious emesis and dry heaves for 4 days. He was treated at [facility] yesterday for same and discharged home.  He was Dx with gastroparesis. Reports that he has a prescription for zofran but has been unable to hold that down.

## 2013-03-30 ENCOUNTER — Emergency Department (HOSPITAL_BASED_OUTPATIENT_CLINIC_OR_DEPARTMENT_OTHER)
Admission: EM | Admit: 2013-03-30 | Discharge: 2013-03-30 | Disposition: A | Discharge status: Home / Self Care | Payer: Medicaid Other | Attending: Emergency Medicine | Admitting: Emergency Medicine

## 2013-03-30 ENCOUNTER — Emergency Department (HOSPITAL_COMMUNITY)
Admission: EM | Admit: 2013-03-30 | Discharge: 2013-03-30 | Discharge status: Against Medical Advice | Payer: Medicaid Other | Attending: Emergency Medicine | Admitting: Emergency Medicine

## 2013-03-30 ENCOUNTER — Telehealth: Payer: Self-pay | Admitting: Internal Medicine

## 2013-03-30 ENCOUNTER — Telehealth: Payer: Self-pay | Admitting: Family Medicine

## 2013-03-30 ENCOUNTER — Encounter (HOSPITAL_BASED_OUTPATIENT_CLINIC_OR_DEPARTMENT_OTHER): Payer: Self-pay

## 2013-03-30 DIAGNOSIS — Z79899 Other long term (current) drug therapy: Secondary | ICD-10-CM | POA: Insufficient documentation

## 2013-03-30 DIAGNOSIS — K219 Gastro-esophageal reflux disease without esophagitis: Secondary | ICD-10-CM | POA: Insufficient documentation

## 2013-03-30 DIAGNOSIS — K3184 Gastroparesis: Secondary | ICD-10-CM

## 2013-03-30 DIAGNOSIS — E1149 Type 2 diabetes mellitus with other diabetic neurological complication: Secondary | ICD-10-CM | POA: Insufficient documentation

## 2013-03-30 DIAGNOSIS — E1142 Type 2 diabetes mellitus with diabetic polyneuropathy: Secondary | ICD-10-CM | POA: Diagnosis not present

## 2013-03-30 DIAGNOSIS — Z880 Allergy status to penicillin: Secondary | ICD-10-CM | POA: Insufficient documentation

## 2013-03-30 DIAGNOSIS — F17200 Nicotine dependence, unspecified, uncomplicated: Secondary | ICD-10-CM | POA: Diagnosis not present

## 2013-03-30 DIAGNOSIS — Z8614 Personal history of Methicillin resistant Staphylococcus aureus infection: Secondary | ICD-10-CM | POA: Insufficient documentation

## 2013-03-30 DIAGNOSIS — J45909 Unspecified asthma, uncomplicated: Secondary | ICD-10-CM | POA: Insufficient documentation

## 2013-03-30 DIAGNOSIS — R1110 Vomiting, unspecified: Secondary | ICD-10-CM | POA: Diagnosis present

## 2013-03-30 DIAGNOSIS — Z794 Long term (current) use of insulin: Secondary | ICD-10-CM | POA: Insufficient documentation

## 2013-03-30 HISTORY — DX: Gastroparesis: K31.84

## 2013-03-30 HISTORY — DX: Type 2 diabetes mellitus with diabetic neuropathy, unspecified: E11.40

## 2013-03-30 LAB — COMPREHENSIVE METABOLIC PANEL
ALT: <5 U/L (ref 0–53)
AST: 14 U/L (ref 0–37)
CO2: 30 mEq/L (ref 19–32)
Calcium: 9.1 mg/dL (ref 8.4–10.5)
Chloride: 99 mEq/L (ref 96–112)
Creatinine, Ser: 0.83 mg/dL (ref 0.50–1.35)
GFR calc Af Amer: >90 mL/min (ref >90)
GFR calc non Af Amer: >90 mL/min (ref >90)
Glucose, Bld: 221 mg/dL — ABNORMAL HIGH (ref 70–99)
Total Bilirubin: 0.3 mg/dL (ref 0.3–1.2)

## 2013-03-30 LAB — GLUCOSE, CAPILLARY: Glucose-Capillary: 173 mg/dL — ABNORMAL HIGH (ref 70–99)

## 2013-03-30 MED ORDER — PROMETHAZINE HCL 25 MG RE SUPP: 25.0000 mg | Freq: Four times a day (QID) | RECTAL | Status: DC | PRN

## 2013-03-30 MED ORDER — HYDROMORPHONE HCL PF 1 MG/ML IJ SOLN
1.0000 mg | Freq: Once | INTRAMUSCULAR | Status: AC
  Administered 2013-03-30: 1 mg via INTRAVENOUS
  Filled 2013-03-30: qty 1

## 2013-03-30 MED ORDER — METOCLOPRAMIDE HCL 5 MG/ML IJ SOLN
10.0000 mg | Freq: Once | INTRAMUSCULAR | Status: AC
  Administered 2013-03-30: 10 mg via INTRAVENOUS

## 2013-03-30 MED ORDER — METOCLOPRAMIDE HCL 5 MG/ML IJ SOLN
INTRAMUSCULAR | Status: AC
  Filled 2013-03-30: qty 2

## 2013-03-30 MED ORDER — SODIUM CHLORIDE 0.9 % IV SOLN
Freq: Once | INTRAVENOUS | Status: AC
  Administered 2013-03-30: 02:00:00 via INTRAVENOUS

## 2013-03-30 NOTE — Telephone Encounter (Signed)
Pt walked in to [place] office after leaving ED, stating that there was a 10 hour wait at the ED. Pt advised, per Dr. Clent Ridges, his best option currently is the ED for admission. Advised pt the [facility] should be a shorter wait time. Strongly advised that he not leave or refuse admission.   Noticed that pt has GI appt scheduled for 04/24/2013. Called GI and spoke with Dr. Lauro Franklin nurse to see if pt could possibly be seen sooner. Nurse states that she sent a message to MD and is awaiting a response. She will call me back once it is taken care of

## 2013-03-30 NOTE — ED Provider Notes (Signed)
History     CSN: [id number]  Arrival date & time 03/30/13  3086   First MD Initiated Contact with Patient 03/30/13 [phone]      Chief Complaint  Patient presents with  . Vomiting     (Consider location/radiation/quality/duration/timing/severity/associated sxs/prior treatment) HPI This is a 27 year old male with insulin-dependent diabetes and diabetic gastroparesis. He was seen here recently for intractable nausea and vomiting but now has been on for about 4 days. The vomiting is exacerbated by any intake. He was given IV fluids and medications and discharged home. He returns with recurrence of symptoms. He states the associated pain is severe. He has a prescription for Percocet which he has not filled as he does not think he can keep down any way. He has had relief with Phenergan suppositories in the past but needs a refill.  Past Medical History  Diagnosis Date  . Diabetes mellitus   . GERD (gastroesophageal reflux disease)   . Asthma   . Hx MRSA infection     on face  . Gastroparesis   . Diabetic neuropathy     Past Surgical History  Procedure Laterality Date  . Tonsillectomy      Family History  Problem Relation Age of Onset  . Diabetes Father   . Asthma      fhx  . Hypertension      fhx  . Stroke      fhx    History  Substance Use Topics  . Smoking status: Current Every Day Smoker    Types: Cigars  . Smokeless tobacco: Never Used     Comment: 2 per day  . Alcohol Use: No      Review of Systems  All other systems reviewed and are negative.    Allergies  Cefuroxime axetil; Penicillins; Tessalon; and Shellfish allergy  Home Medications   Current Outpatient Rx  Name  Route  Sig  Dispense  Refill  . insulin aspart (NOVOLOG FLEXPEN) 100 UNIT/ML injection   Subcutaneous   Inject 1-17 Units into the skin 4 (four) times daily -  before meals and at bedtime.   1 vial   6   . insulin glargine (LANTUS) 100 UNIT/ML injection   Subcutaneous   Inject 0.32  mLs (32 Units total) into the skin at bedtime.   10 mL   6   . Omeprazole-Sodium Bicarbonate (ZEGERID OTC) 20-1100 MG CAPS   Oral   Take 1 capsule by mouth at bedtime.          Marland Kitchen oxyCODONE-acetaminophen (PERCOCET) 10-325 MG per tablet   Oral   Take 1 tablet by mouth every 4 (four) hours as needed for pain.   20 tablet   0   . zolpidem (AMBIEN) 10 MG tablet   Oral   Take 10 mg by mouth at bedtime as needed for sleep.           BP 114/67  Pulse 102  Temp(Src) 98.5 F (36.9 C) (Oral)  SpO2 97%  Physical Exam General: Well-developed, well-nourished male in no acute distress; appearance consistent with age of record HENT: normocephalic, atraumatic Eyes: pupils equal round and reactive to light; extraocular muscles intact Neck: supple Heart: regular rate and rhythm Lungs: clear to auscultation bilaterally Abdomen: soft; nondistended; epigastric tenderness; no masses or hepatosplenomegaly; bowel sounds present Extremities: No deformity; full range of motion; pulses normal Neurologic: Awake, alert and oriented; motor function intact in all extremities and symmetric; no facial droop Skin: Chronic appearing changes to lower  legs Psychiatric: Mildly anxious    ED Course  Procedures (including critical care time)     MDM   Nursing notes and vitals signs, including pulse oximetry, reviewed.  Summary of this visit's results, reviewed by myself:  Labs:  Results for orders placed during the hospital encounter of 03/30/13 (from the past 24 hour(s))  GLUCOSE, CAPILLARY     Status: Abnormal   Collection Time    03/30/13  1:06 AM      Result Value Range   Glucose-Capillary 173 (*) 70 - 99 mg/dL   1:61 AM Patient's nausea has been relieved by Reglan. He continues to have pain. He was advised that narcotics are of questionable value and diabetic gastroparesis as they tend to slow the gut. At this time he is requesting a second dose of pain medication and a prescription for  Phenergan suppositories. He states he'll contact his primary care physician today.        Hanley Seamen, MD 03/30/13 [phone]

## 2013-03-30 NOTE — Telephone Encounter (Signed)
[facility] Triage Call Report Triage Record Num: [id number] Operator: [place] [employer] Patient Name: [facility] Call Date & Time: 03/29/2013 10:13:29PM Patient Phone: [phone] PCP: Tera Mater. Fry Patient Gender: Male PCP Fax : [phone] Patient DOB: 07-10-1986 Practice Name: [facility] Reason for Call: Caller: Don/Father; PCP: Gershon Crane (Family Practice); CB#: [phone]; Call regarding Vomiting, weakness (can't stand up); been to UC and ED 3 days in a row, and they keep sending him home,seen at [facility], and [facility] twice; Diabetic, pockets of infection in gums, ulcers and sores, gastroparesis; GERD; seizure 03/27/13; vomiting for 3-4 days; weight has dropped over the last 3 months from 126 to 118; Parents very concerned, the patient is not currently with them, no triage; Pt's mother states that the pt's fiance's father is going to take him to [facility] now. Protocol(s) Used: Office Note Recommended Outcome per Protocol: Information Noted and Sent to Office Reason for Outcome: Caller information to office Care Advice: ~ 06/

## 2013-03-30 NOTE — Telephone Encounter (Signed)
Dr Rhea Belton agreed to see the pt, but has no openings. Pt will see Mike Gip, PA tomorrow. Tamesha at [place] has already informed parents she will need to bring $184 to the appt , but mom states they don't have it. They have applied for Medicaid, but they need to apply for [facility] Assistance as well and bring perhaps, $40 to the visit. Mom stated understanding.

## 2013-03-30 NOTE — ED Notes (Signed)
Patient here with ongoing nausea and vomiting x 4 days. Vomiting with any intake. Here last pm for same and after liquid intake the symptoms returned. Also concerned with ongoing left foot pain, reports wart to same.

## 2013-03-31 ENCOUNTER — Ambulatory Visit (INDEPENDENT_AMBULATORY_CARE_PROVIDER_SITE_OTHER): Payer: Medicaid Other | Admitting: Internal Medicine

## 2013-03-31 ENCOUNTER — Encounter: Payer: Self-pay | Admitting: Internal Medicine

## 2013-03-31 ENCOUNTER — Ambulatory Visit: Payer: Self-pay | Admitting: Physician Assistant

## 2013-03-31 ENCOUNTER — Other Ambulatory Visit: Payer: Self-pay

## 2013-03-31 ENCOUNTER — Encounter (HOSPITAL_COMMUNITY): Payer: Self-pay

## 2013-03-31 VITALS — BP 90/60 | HR 88 | Ht 68.0 in | Wt 129.1 lb

## 2013-03-31 DIAGNOSIS — R109 Unspecified abdominal pain: Secondary | ICD-10-CM

## 2013-03-31 DIAGNOSIS — R112 Nausea with vomiting, unspecified: Secondary | ICD-10-CM

## 2013-03-31 DIAGNOSIS — K3184 Gastroparesis: Secondary | ICD-10-CM

## 2013-03-31 DIAGNOSIS — E1142 Type 2 diabetes mellitus with diabetic polyneuropathy: Secondary | ICD-10-CM

## 2013-03-31 DIAGNOSIS — E1065 Type 1 diabetes mellitus with hyperglycemia: Secondary | ICD-10-CM

## 2013-03-31 DIAGNOSIS — E1049 Type 1 diabetes mellitus with other diabetic neurological complication: Secondary | ICD-10-CM

## 2013-03-31 DIAGNOSIS — K044 Acute apical periodontitis of pulpal origin: Secondary | ICD-10-CM

## 2013-03-31 DIAGNOSIS — K047 Periapical abscess without sinus: Secondary | ICD-10-CM

## 2013-03-31 SURGERY — COLONOSCOPY
Anesthesia: Monitor Anesthesia Care

## 2013-03-31 MED ORDER — METOCLOPRAMIDE HCL 10 MG PO TABS: 10.0000 mg | ORAL_TABLET | Freq: Four times a day (QID) | ORAL | Status: DC

## 2013-03-31 NOTE — Progress Notes (Signed)
Patient ID: Jason [name], male   DOB: 01/24/1986, 27 y.o.   MRN: [id number] HPI: [name] is a 27 yo male with PMH of poorly controlled type 1 diabetes mellitus associated with diabetic neuropathy, admissions for DKA, GERD, and mild asthma who seen in consultation at the request of Dr. Clent Ridges for evaluation of abdominal pain, nausea and vomiting. The patient reports over the last 6-7 days he's had severe issues with nausea vomiting and mid abdominal pain. He said issues being food do to nausea and vomiting. He has been seen in the ER several times over the last week. He is usually hydrated, pain controlled and discharged. He was seen most recently, 3 days ago, at [facility] in the ER. At that time CT abdomen and pelvis was done which was reportedly unremarkable. He reports he's been drinking water and try to eat very soft bland foods such as graham crackers. Over the last 24 hours he's been able to keep down water without much vomiting. He has not vomited today. He has vomited multiple times over the last 6-7 days, however. He does occasionally report mid and right-sided abdominal pain which radiates into the right flank. This is worse after vomiting. He also reports "hunger pain" and feels as if his "stomach is deflated". He is having fairly normal bowel movements yesterday had a particularly loose stool. His stool is been nonbloody and non-melenic. He is usually having one to 2 bowel movements daily. The ER physician recommended that he take Reglan but he's only used this twice in the last 2 days. He has a long history of reflux which for him is manifested as GERD and waterbrash. He has taken multiple PPIs in the past but is currently taking Zegreid,(previously pantoprazole, Nexium, Dexilant and he reports changing due to price) which worked well to control his heartburn. He is using daily oxycodone for neuropathy. He occasionally takes ibuprofen as well.    He reports for the last 3  or 4 days his sugars have been better controlled but admits to erratic blood sugars in the past. He has not been able to take normal amounts of insulin due to poor by mouth intake. About 10 days ago he was seen in the ER and diagnosed with a dental abscess. Clindamycin was recommended but he could not afford this. He never took any additional antibiotics. He also reports being told he may have a urinary tract infection but also was not treated with antibiotics.  He was referred to an endocrinologist but has not been to to lack of insurance. He reports he has Medicaid pending approval  Patient Active Problem List   Diagnosis Date Noted  . Dental abscess 03/20/2013  . Hyperglycemia 03/20/2013  . Dehydration 03/20/2013  . Painful diabetic neuropathy 03/20/2013  . Diabetic hyperosmolar non-ketotic state 01/29/2013  . Nausea and vomiting 01/29/2013  . Traumatic ecchymosis of toe 01/29/2013  . Wound cellulitis 01/29/2013  . Tobacco abuse 01/29/2013  . Noncompliance with diet and medication regimen 01/29/2013  . CELLULITIS AND ABSCESS OF FACE 08/27/2009  . SPRAIN&STRAIN OTH SPEC SITES SHOULDER&UPPER ARM 06/09/2008  . CONTUSION OF CHEST WALL 06/09/2008  . VIRAL URI 09/15/2007  . GERD 09/15/2007  . Type 1 diabetes, uncontrolled, with neuropathy 05/29/2007  . ASTHMA 05/29/2007    Past Surgical History  Procedure Laterality Date  . Tonsillectomy      Current Outpatient Prescriptions  Medication Sig Dispense Refill  . ibuprofen (ADVIL,MOTRIN) 200 MG tablet Take 200 mg  by mouth every 6 (six) hours as needed for pain.      Marland Kitchen insulin aspart (NOVOLOG FLEXPEN) 100 UNIT/ML injection Inject 1-17 Units into the skin 4 (four) times daily -  before meals and at bedtime.  1 vial  6  . insulin glargine (LANTUS) 100 UNIT/ML injection Inject 0.32 mLs (32 Units total) into the skin at bedtime.  10 mL  6  . Omeprazole-Sodium Bicarbonate (ZEGERID OTC) 20-1100 MG CAPS Take 1 capsule by mouth at bedtime.        Marland Kitchen oxyCODONE-acetaminophen (PERCOCET) 10-325 MG per tablet Take 1 tablet by mouth every 4 (four) hours as needed for pain.  20 tablet  0  . promethazine (PHENERGAN) 25 MG suppository Place 1 suppository (25 mg total) rectally every 6 (six) hours as needed for nausea.  30 each  0  . zolpidem (AMBIEN) 10 MG tablet Take 10 mg by mouth at bedtime as needed for sleep.      . metoCLOPramide (REGLAN) 10 MG tablet Take 1 tablet (10 mg total) by mouth 4 (four) times daily.  120 tablet  1   No current facility-administered medications for this visit.    Allergies  Allergen Reactions  . Cefuroxime Axetil Anaphylaxis  . Penicillins Anaphylaxis  . Tessalon (Benzonatate) Anaphylaxis    Can take amoxicillin  . Shellfish Allergy Itching    Family History  Problem Relation Age of Onset  . Diabetes Father   . Hypertension Father   . Asthma      fhx  . Hypertension      fhx  . Stroke      fhx  . Heart disease Mother     History  Substance Use Topics  . Smoking status: Current Every Day Smoker    Types: Cigars  . Smokeless tobacco: Never Used     Comment: 2 per day  . Alcohol Use: No    ROS: As per history of present illness, otherwise negative  BP 90/60  Pulse 88  Ht 5\' 8"  (1.727 m)  Wt 129 lb 2 oz (58.571 kg)  BMI 19.64 kg/m2 Constitutional: Thin young male in no acute distress HEENT: Normocephalic and atraumatic. Oropharynx is clear and moist. No oropharyngeal exudate. Conjunctivae are normal.  No scleral icterus. Neck: Neck supple. Trachea midline. Cardiovascular: Normal rate, regular rhythm and intact distal pulses.  Pulmonary/chest: Effort normal and breath sounds normal. No wheezing, rales or rhonchi. Abdominal: Soft, nontender, nondistended. Bowel sounds active throughout. There are no masses palpable. No hepatosplenomegaly. Extremities: no clubbing, cyanosis, or edema Lymphadenopathy: No cervical adenopathy noted. Neurological: Alert and oriented to person place and  time. Skin: Skin is warm and dry. No rashes noted. Psychiatric: Normal mood and affect. Behavior is normal.  RELEVANT LABS AND IMAGING: CBC    Component Value Date/Time   WBC 9.3 03/29/2013 2301   RBC 3.96* 03/29/2013 2301   HGB 10.6* 03/29/2013 2301   HCT 31.6* 03/29/2013 2301   PLT 639* 03/29/2013 2301   MCV 79.8 03/29/2013 2301   MCH 26.8 03/29/2013 2301   MCHC 33.5 03/29/2013 2301   RDW 12.6 03/29/2013 2301   LYMPHSABS 3.0 03/29/2013 2301   MONOABS 1.2* 03/29/2013 2301   EOSABS 0.4 03/29/2013 2301   BASOSABS 0.1 03/29/2013 2301    CMP     Component Value Date/Time   NA 137 03/29/2013 2301   K 3.8 03/29/2013 2301   CL 99 03/29/2013 2301   CO2 30 03/29/2013 2301   GLUCOSE 221* 03/29/2013 2301  BUN 6 03/29/2013 2301   CREATININE 0.83 03/29/2013 2301   CALCIUM 9.1 03/29/2013 2301   PROT 7.5 03/29/2013 2301   ALBUMIN 3.2* 03/29/2013 2301   AST 14 03/29/2013 2301   ALT <5 03/29/2013 2301   ALKPHOS 89 03/29/2013 2301   BILITOT 0.3 03/29/2013 2301   GFRNONAA >90 03/29/2013 2301   GFRAA >90 03/29/2013 2301   Lipase     Component Value Date/Time   LIPASE 13 03/29/2013 2301   Abdominal x-ray 03/28/2013 -- no acute findings  CT scan abdomen and pelvis with contrast 03/28/2013 -- impression: "Tiny amount of fluid in the posterior pelvis, otherwise no acute findings in the abdomen/pelvis." (Report to be scanned into electronically medical record)  ASSESSMENT/PLAN: 27 yo male with PMH of poorly controlled type 1 diabetes mellitus associated with diabetic neuropathy, admissions for DKA, GERD, and mild asthma who seen in consultation at the request of Dr. Clent Ridges for evaluation of abdominal pain, nausea and vomiting.   1.  N/V/abd pain -- the patient's symptoms are most consistent with diabetic gastroparesis exacerbated by narcotic pain medication use. I cannot entirely exclude other potential pathology such as gastroduodenitis or peptic ulcer disease. We discussed gastroparesis at length today and given  his acute exacerbation I have recommended scheduled Reglan 10 mg 4 times daily. Would like for him to use this medication for 2 weeks and return for reassessment. He will continue to use Phenergan suppositories as needed for nausea. I would like for him to follow a gastroparesis diet he was given a handout with dietary suggestions today. He will continue his PPI for now. I have advised him to focus on hydration first and then add in foods per gastroparesis diet.  I have recommended upper endoscopy to exclude other upper GI pathology. The test was discussed today including risks and benefits and he is agreeable to proceed.  2.  Diabetes type 1 -- I have encouraged tight glucose control is much as possible. I have also encouraged him to proceed to endocrinology consultation once Medicaid is in place  3.  Recent dental abscess -- he is penicillin allergic and was prescribed clindamycin about 10 days ago for an oral infection. He has yet to be treated appropriately with antibiotics. I've asked that he contact Dr. Claris Che office today to discuss other options for this infection. Certainly any infection would exacerbate periods of hyperglycemia and in turn worsen gastroparesis.  4.  I would like for return in 2 weeks to be seen either by me or one of our advanced practitioners.

## 2013-03-31 NOTE — Patient Instructions (Addendum)
You have been scheduled for an endoscopy with propofol. Please follow written instructions given to you at your visit today. If you use inhalers (even only as needed), please bring them with you on the day of your procedure. Your physician has requested that you go to www.startemmi.com and enter the access code given to you at your visit today. This web site gives a general overview about your procedure. However, you should still follow specific instructions given to you by our office regarding your preparation for the procedure.  Your physician has requested that you go to the basement for the following lab work before leaving today: Urine culture  We have sent the following medications to your pharmacy for you to pick up at your convenience: Reglan; take four times daily  Continue taking  Phenergan suppositories as directed   You have been given a gastroparesis diet.  Follow up with your primary care Doctor about dental infection and the inability to take Antibiotics   Follow up with Dr. Rhea Belton in 2 weeks in office                                               We are excited to introduce MyChart, a new best-in-class service that provides you online access to important information in your electronic medical record. We want to make it easier for you to view your health information - all in one secure location - when and where you need it. We expect MyChart will enhance the quality of care and service we provide.  When you register for MyChart, you can:    View your test results.    Request appointments and receive appointment reminders via email.    Request medication renewals.    View your medical history, allergies, medications and immunizations.    Communicate with your physician's office through a password-protected site.    Conveniently print information such as your medication lists.  To find out if MyChart is right for you, please talk to a member of our clinical staff today.  We will gladly answer your questions about this free health and wellness tool.  If you are age 29 or older and want a member of your family to have access to your record, you must provide written consent by completing a proxy form available at our office. Please speak to our clinical staff about guidelines regarding accounts for patients younger than age 22.  As you activate your MyChart account and need any technical assistance, please call the MyChart technical support line at [phone]) or email your question to [email or web address]. If you email your question(s), please include your name, a return phone number and the best time to reach you.  If you have non-urgent health-related questions, you can send a message to our office through MyChart at [email or web address]. If you have a medical emergency, call 911.  Thank you for using MyChart as your new health and wellness resource!   MyChart licensed from [employer],  4540-9811. Patents Pending.

## 2013-04-01 ENCOUNTER — Emergency Department (HOSPITAL_BASED_OUTPATIENT_CLINIC_OR_DEPARTMENT_OTHER)
Admission: EM | Admit: 2013-04-01 | Discharge: 2013-04-01 | Disposition: A | Discharge status: Home / Self Care | Payer: Medicaid Other | Attending: Emergency Medicine | Admitting: Emergency Medicine

## 2013-04-01 ENCOUNTER — Encounter (HOSPITAL_BASED_OUTPATIENT_CLINIC_OR_DEPARTMENT_OTHER): Payer: Self-pay | Admitting: *Deleted

## 2013-04-01 ENCOUNTER — Ambulatory Visit (HOSPITAL_COMMUNITY): Admit: 2013-04-01 | Payer: Self-pay | Admitting: Internal Medicine

## 2013-04-01 DIAGNOSIS — E1142 Type 2 diabetes mellitus with diabetic polyneuropathy: Secondary | ICD-10-CM | POA: Insufficient documentation

## 2013-04-01 DIAGNOSIS — E1149 Type 2 diabetes mellitus with other diabetic neurological complication: Secondary | ICD-10-CM | POA: Insufficient documentation

## 2013-04-01 DIAGNOSIS — N390 Urinary tract infection, site not specified: Secondary | ICD-10-CM

## 2013-04-01 DIAGNOSIS — K219 Gastro-esophageal reflux disease without esophagitis: Secondary | ICD-10-CM | POA: Diagnosis not present

## 2013-04-01 DIAGNOSIS — F17200 Nicotine dependence, unspecified, uncomplicated: Secondary | ICD-10-CM | POA: Insufficient documentation

## 2013-04-01 DIAGNOSIS — K3184 Gastroparesis: Secondary | ICD-10-CM | POA: Insufficient documentation

## 2013-04-01 DIAGNOSIS — R1110 Vomiting, unspecified: Secondary | ICD-10-CM | POA: Insufficient documentation

## 2013-04-01 DIAGNOSIS — Z794 Long term (current) use of insulin: Secondary | ICD-10-CM | POA: Insufficient documentation

## 2013-04-01 DIAGNOSIS — Z79899 Other long term (current) drug therapy: Secondary | ICD-10-CM | POA: Insufficient documentation

## 2013-04-01 DIAGNOSIS — J45909 Unspecified asthma, uncomplicated: Secondary | ICD-10-CM | POA: Diagnosis not present

## 2013-04-01 DIAGNOSIS — Z880 Allergy status to penicillin: Secondary | ICD-10-CM | POA: Insufficient documentation

## 2013-04-01 DIAGNOSIS — R109 Unspecified abdominal pain: Secondary | ICD-10-CM | POA: Diagnosis present

## 2013-04-01 DIAGNOSIS — Z8614 Personal history of Methicillin resistant Staphylococcus aureus infection: Secondary | ICD-10-CM | POA: Insufficient documentation

## 2013-04-01 LAB — CBC WITH DIFFERENTIAL/PLATELET
Basophils Absolute: 0.1 10*3/uL (ref 0.0–0.1)
Basophils Relative: 1 % (ref 0–1)
Eosinophils Absolute: 0.3 10*3/uL (ref 0.0–0.7)
Eosinophils Relative: 3 % (ref 0–5)
MCH: 27.4 pg (ref 26.0–34.0)
MCHC: 33.9 g/dL (ref 30.0–36.0)
MCV: 80.7 fL (ref 78.0–100.0)
Neutrophils Relative %: 38 % — ABNORMAL LOW (ref 43–77)
Platelets: 663 10*3/uL — ABNORMAL HIGH (ref 150–400)
RDW: 12.2 % (ref 11.5–15.5)

## 2013-04-01 LAB — URINALYSIS, ROUTINE W REFLEX MICROSCOPIC
Glucose, UA: >1000 mg/dL — AB
Leukocytes, UA: NEGATIVE
pH: 6.5 (ref 5.0–8.0)

## 2013-04-01 LAB — URINE CULTURE: Colony Count: NO GROWTH

## 2013-04-01 LAB — COMPREHENSIVE METABOLIC PANEL
ALT: <5 U/L (ref 0–53)
AST: 10 U/L (ref 0–37)
Albumin: 3.3 g/dL — ABNORMAL LOW (ref 3.5–5.2)
Calcium: 9.5 mg/dL (ref 8.4–10.5)
GFR calc Af Amer: >90 mL/min (ref >90)
Glucose, Bld: 388 mg/dL — ABNORMAL HIGH (ref 70–99)
Potassium: 3.5 mEq/L (ref 3.5–5.1)
Sodium: 136 mEq/L (ref 135–145)
Total Protein: 7.9 g/dL (ref 6.0–8.3)

## 2013-04-01 LAB — URINE MICROSCOPIC-ADD ON

## 2013-04-01 MED ORDER — PROMETHAZINE HCL 25 MG RE SUPP: 25.0000 mg | Freq: Four times a day (QID) | RECTAL | Status: DC | PRN

## 2013-04-01 MED ORDER — CIPROFLOXACIN HCL 500 MG PO TABS: 500.0000 mg | ORAL_TABLET | Freq: Two times a day (BID) | ORAL | Status: DC

## 2013-04-01 MED ORDER — METOCLOPRAMIDE HCL 10 MG PO TABS: 10.0000 mg | ORAL_TABLET | Freq: Three times a day (TID) | ORAL | Status: DC

## 2013-04-01 MED ORDER — DIPHENHYDRAMINE HCL 50 MG/ML IJ SOLN
25.0000 mg | Freq: Once | INTRAMUSCULAR | Status: AC
  Administered 2013-04-01: 25 mg via INTRAVENOUS
  Filled 2013-04-01: qty 1

## 2013-04-01 MED ORDER — SODIUM CHLORIDE 0.9 % IV BOLUS (SEPSIS)
1000.0000 mL | Freq: Once | INTRAVENOUS | Status: AC
  Administered 2013-04-01: 1000 mL via INTRAVENOUS

## 2013-04-01 MED ORDER — METOCLOPRAMIDE HCL 5 MG/ML IJ SOLN
10.0000 mg | Freq: Once | INTRAMUSCULAR | Status: AC
  Administered 2013-04-01: 10 mg via INTRAVENOUS
  Filled 2013-04-01: qty 2

## 2013-04-01 MED ORDER — HYDROMORPHONE HCL PF 2 MG/ML IJ SOLN
2.0000 mg | Freq: Once | INTRAMUSCULAR | Status: AC
  Administered 2013-04-01: 2 mg via INTRAVENOUS
  Filled 2013-04-01: qty 1

## 2013-04-01 NOTE — ED Notes (Signed)
Pt sts he has been dx with gastroparesis and cannot take anything by mouth. His GI doc put him on a new diet and told him that it may take time to work.

## 2013-04-01 NOTE — ED Provider Notes (Signed)
History    CSN: [id number] Arrival date & time 04/01/13  0035  First MD Initiated Contact with Patient 04/01/13 0136     Chief Complaint  Patient presents with  . Abdominal Pain   (Consider location/radiation/quality/duration/timing/severity/associated sxs/prior Treatment) Patient is a 27 y.o. male presenting with abdominal pain and vomiting. The history is provided by the patient.  Abdominal Pain This is a chronic problem. The current episode started more than 1 week ago. The problem occurs constantly. The problem has not changed since onset.Associated symptoms include abdominal pain. Pertinent negatives include no chest pain, no headaches and no shortness of breath. Nothing aggravates the symptoms. Nothing relieves the symptoms. He has tried nothing for the symptoms. The treatment provided no relief.  Emesis Severity:  Mild Timing:  Intermittent Number of daily episodes:  3 Quality:  Stomach contents Progression:  Unchanged Chronicity:  Recurrent Recent urination:  Normal Relieved by:  Nothing Worsened by:  Nothing tried Ineffective treatments:  None tried Associated symptoms: abdominal pain   Associated symptoms: no diarrhea and no headaches   Risk factors: no sick contacts   Symptoma are typical of his gastroparesis.  Saw GI earlier in the day and told to come to the Ed if pain worse.   Past Medical History  Diagnosis Date  . Diabetes mellitus   . GERD (gastroesophageal reflux disease)   . Asthma   . Hx MRSA infection     on face  . Gastroparesis   . Diabetic neuropathy    Past Surgical History  Procedure Laterality Date  . Tonsillectomy     Family History  Problem Relation Age of Onset  . Diabetes Father   . Hypertension Father   . Asthma      fhx  . Hypertension      fhx  . Stroke      fhx  . Heart disease Mother    History  Substance Use Topics  . Smoking status: Current Every Day Smoker    Types: Cigars  . Smokeless tobacco: Never Used   Comment: 2 per day  . Alcohol Use: No    Review of Systems  Respiratory: Negative for shortness of breath.   Cardiovascular: Negative for chest pain.  Gastrointestinal: Positive for vomiting and abdominal pain. Negative for diarrhea.  Neurological: Negative for headaches.  All other systems reviewed and are negative.    Allergies  Cefuroxime axetil; Penicillins; Tessalon; and Shellfish allergy  Home Medications   Current Outpatient Rx  Name  Route  Sig  Dispense  Refill  . ibuprofen (ADVIL,MOTRIN) 200 MG tablet   Oral   Take 200 mg by mouth every 6 (six) hours as needed for pain.         Marland Kitchen insulin aspart (NOVOLOG FLEXPEN) 100 UNIT/ML injection   Subcutaneous   Inject 1-17 Units into the skin 4 (four) times daily -  before meals and at bedtime.   1 vial   6   . insulin glargine (LANTUS) 100 UNIT/ML injection   Subcutaneous   Inject 0.32 mLs (32 Units total) into the skin at bedtime.   10 mL   6   . metoCLOPramide (REGLAN) 10 MG tablet   Oral   Take 1 tablet (10 mg total) by mouth 4 (four) times daily.   120 tablet   1   . Omeprazole-Sodium Bicarbonate (ZEGERID OTC) 20-1100 MG CAPS   Oral   Take 1 capsule by mouth at bedtime.          Marland Kitchen  oxyCODONE-acetaminophen (PERCOCET) 10-325 MG per tablet   Oral   Take 1 tablet by mouth every 4 (four) hours as needed for pain.   20 tablet   0   . promethazine (PHENERGAN) 25 MG suppository   Rectal   Place 1 suppository (25 mg total) rectally every 6 (six) hours as needed for nausea.   30 each   0   . zolpidem (AMBIEN) 10 MG tablet   Oral   Take 10 mg by mouth at bedtime as needed for sleep.          BP 123/80  Pulse 92  Temp(Src) 98.4 F (36.9 C) (Oral)  Resp 16  Ht 5\' 7"  (1.702 m)  Wt 129 lb (58.514 kg)  BMI 20.2 kg/m2  SpO2 99% Physical Exam  Constitutional: He is oriented to person, place, and time. He appears well-developed and well-nourished. No distress.  HENT:  Head: Normocephalic and  atraumatic.  Mouth/Throat: Oropharynx is clear and moist.  Eyes: Conjunctivae are normal. Pupils are equal, round, and reactive to light.  Neck: Normal range of motion. Neck supple.  Cardiovascular: Normal rate, regular rhythm and intact distal pulses.   Pulmonary/Chest: Effort normal and breath sounds normal. He has no wheezes. He has no rales.  Abdominal: Soft. Bowel sounds are normal. There is no tenderness. There is no rebound and no guarding.  Musculoskeletal: Normal range of motion.  Plantar wart on the plantar surface of the left foot not infected  Neurological: He is alert and oriented to person, place, and time.  Skin: Skin is warm and dry.  Psychiatric: He has a normal mood and affect.    ED Course  Procedures (including critical care time) Labs Reviewed  CBC WITH DIFFERENTIAL - Abnormal; Notable for the following:    RBC 4.05 (*)    Hemoglobin 11.1 (*)    HCT 32.7 (*)    Platelets 663 (*)    Neutrophils Relative % 38 (*)    Lymphs Abs 4.5 (*)    Monocytes Relative 13 (*)    Monocytes Absolute 1.4 (*)    All other components within normal limits  COMPREHENSIVE METABOLIC PANEL - Abnormal; Notable for the following:    Glucose, Bld 388 (*)    Albumin 3.3 (*)    All other components within normal limits  URINALYSIS, ROUTINE W REFLEX MICROSCOPIC - Abnormal; Notable for the following:    APPearance CLOUDY (*)    Specific Gravity, Urine 1.037 (*)    Glucose, UA >1000 (*)    Hgb urine dipstick MODERATE (*)    Protein, ur 100 (*)    All other components within normal limits  URINE MICROSCOPIC-ADD ON - Abnormal; Notable for the following:    Squamous Epithelial / LPF MANY (*)    Bacteria, UA FEW (*)    All other components within normal limits  URINE CULTURE   No results found. No diagnosis found.  MDM  Will treat for uti follow up with your doctor for ongoing care  Virna Livengood K Tyrah Broers-Rasch, MD 04/01/13 [phone]

## 2013-04-01 NOTE — Discharge Instructions (Signed)
Gastroparesis   Gastroparesis is also called slowed stomach emptying (delayed gastric emptying). It is a condition in which the stomach takes too long to empty its contents. It often happens in people with diabetes.   CAUSES   Gastroparesis happens when nerves to the stomach are damaged or stop working. When the nerves are damaged, the muscles of the stomach and intestines do not work normally. The movement of food is slowed or stopped. High blood glucose (sugar) causes changes in nerves and can damage the blood vessels that carry oxygen and nutrients to the nerves.  RISK FACTORS   Diabetes.   Post-viral syndromes.   Eating disorders (anorexia, bulimia).   Surgery on the stomach or vagus nerve.   Gastroesophageal reflux disease (rarely).   Smooth muscle disorders (amyloidosis, scleroderma).   Metabolic disorders, including hypothyroidism.   Parkinson's disease.  SYMPTOMS    Heartburn.   Feeling sick to your stomach (nausea).   Vomiting of undigested food.   An early feeling of fullness when eating.   Weight loss.   Abdominal bloating.   Erratic blood glucose levels.   Lack of appetite.   Gastroesophageal reflux.   Spasms of the stomach wall.  Complications can include:   Bacterial overgrowth in stomach. Food stays in the stomach and can ferment and cause bacteria to grow.   Weight loss due to difficulty digesting and absorbing nutrients.   Vomiting.   Obstruction in the stomach. Undigested food can harden and cause nausea and vomiting.   Blood glucose fluctuations caused by inconsistent food absorption.  DIAGNOSIS   The diagnosis of gastroparesis is confirmed through one or more of the following tests:   Barium X-rays and scans. These tests look at how long it takes for food to move through the stomach.    Gastric manometry. This test measures electrical and muscular activity in the stomach. A thin tube is passed down the throat into the stomach. The tube contains a wire that takes measurements of the stomach's electrical and muscular activity as it digests liquids and solid food.   Endoscopy. This procedure is done with a long, thin tube called an endoscope. It is passed through the mouth and gently guides down the esophagus into the stomach. This tube helps the caregiver look at the lining of the stomach to check for any abnormalities.   Ultrasound. This can rule out gallbladder disease or pancreatitis. This test will outline and define the shape of the gallbladder and pancreas.  TREATMENT    The primary treatment is to identify the problem and help control blood glucose levels. Treatments include:   Exercise.   Medicines to control nausea and vomiting.   Medicines to stimulate stomach muscles.   Changes in what and when you eat.   Having smaller meals more often.   Eating low-fiber forms of high-fiber foods, such aseating cooked vegetables instead of raw vegetables.   Eating low-fat foods.   Consuming liquids, which are easier to digest.   In severe cases, feeding tubes and intravenous (IV) feeding may be needed.  It is important to note that in most cases, treatment does not cure gastroparesis. It is usually a lasting (chronic) condition. Treatment helps you manage the condition so that you can be as healthy and comfortable as possible.  NEW TREATMENTS   A gastric neurostimulator has been developed to assist people with gastroparesis. The battery-operated device is surgically implanted. It emits mild electrical pulses to help improve stomach emptying and to control nausea and   vomiting.    The use of botulinum toxin has been shown to improve stomach emptying by decreasing the prolonged contractions of the muscle between the stomach and the small intestine (pyloric sphincter). The benefits are temporary.  SEEK MEDICAL CARE IF:    You are having problems keeping your blood glucose in goal range.   You are having nausea, vomiting, bloating, or early feelings of fullness with eating.   Your symptoms do not change with a change in diet.  Document Released: 09/24/2005 Document Revised: 12/17/2011 Document Reviewed: 03/03/2009  Exit[employer] Patient Information 2014 [employer].

## 2013-04-02 LAB — URINE CULTURE

## 2013-04-03 NOTE — ED Notes (Signed)
Post ED Visit - Positive Culture Follow-up  Culture report reviewed by antimicrobial stewardship pharmacist: []  Wes Dulaney, Pharm.D., BCPS [x]  Celedonio Miyamoto, Pharm.D., BCPS []  Georgina Pillion, Pharm.D., BCPS []  [place], [place].D., BCPS, AAHIVP []  Estella Husk, Pharm.D., BCPS, AAHIVP  Positive urine culture Treated with Cipro, organism sensitive to the same and no further patient follow-up is required at this time.  Larena Sox 04/03/2013, 2:09 PM

## 2013-04-10 ENCOUNTER — Emergency Department (HOSPITAL_BASED_OUTPATIENT_CLINIC_OR_DEPARTMENT_OTHER)
Admission: EM | Admit: 2013-04-10 | Discharge: 2013-04-10 | Disposition: A | Discharge status: Home / Self Care | Payer: Medicaid Other | Source: Home / Self Care | Attending: Emergency Medicine | Admitting: Emergency Medicine

## 2013-04-10 ENCOUNTER — Encounter (HOSPITAL_BASED_OUTPATIENT_CLINIC_OR_DEPARTMENT_OTHER): Payer: Self-pay | Admitting: *Deleted

## 2013-04-10 DIAGNOSIS — R1084 Generalized abdominal pain: Secondary | ICD-10-CM | POA: Insufficient documentation

## 2013-04-10 DIAGNOSIS — K3184 Gastroparesis: Secondary | ICD-10-CM

## 2013-04-10 DIAGNOSIS — Z8614 Personal history of Methicillin resistant Staphylococcus aureus infection: Secondary | ICD-10-CM | POA: Insufficient documentation

## 2013-04-10 DIAGNOSIS — M79609 Pain in unspecified limb: Secondary | ICD-10-CM | POA: Insufficient documentation

## 2013-04-10 DIAGNOSIS — J45909 Unspecified asthma, uncomplicated: Secondary | ICD-10-CM | POA: Insufficient documentation

## 2013-04-10 DIAGNOSIS — E1142 Type 2 diabetes mellitus with diabetic polyneuropathy: Secondary | ICD-10-CM | POA: Insufficient documentation

## 2013-04-10 DIAGNOSIS — Z8669 Personal history of other diseases of the nervous system and sense organs: Secondary | ICD-10-CM | POA: Insufficient documentation

## 2013-04-10 DIAGNOSIS — Z880 Allergy status to penicillin: Secondary | ICD-10-CM | POA: Insufficient documentation

## 2013-04-10 DIAGNOSIS — F17200 Nicotine dependence, unspecified, uncomplicated: Secondary | ICD-10-CM | POA: Insufficient documentation

## 2013-04-10 DIAGNOSIS — K219 Gastro-esophageal reflux disease without esophagitis: Secondary | ICD-10-CM | POA: Insufficient documentation

## 2013-04-10 DIAGNOSIS — Z794 Long term (current) use of insulin: Secondary | ICD-10-CM | POA: Insufficient documentation

## 2013-04-10 DIAGNOSIS — E1149 Type 2 diabetes mellitus with other diabetic neurological complication: Secondary | ICD-10-CM | POA: Insufficient documentation

## 2013-04-10 DIAGNOSIS — Z79899 Other long term (current) drug therapy: Secondary | ICD-10-CM | POA: Insufficient documentation

## 2013-04-10 HISTORY — DX: Unspecified convulsions: R56.9

## 2013-04-10 LAB — COMPREHENSIVE METABOLIC PANEL
ALT: 13 U/L (ref 0–53)
AST: 17 U/L (ref 0–37)
Albumin: 3.4 g/dL — ABNORMAL LOW (ref 3.5–5.2)
Alkaline Phosphatase: 94 U/L (ref 39–117)
BUN: 10 mg/dL (ref 6–23)
Chloride: 96 mEq/L (ref 96–112)
Potassium: 3.7 mEq/L (ref 3.5–5.1)
Total Bilirubin: 0.5 mg/dL (ref 0.3–1.2)

## 2013-04-10 LAB — URINALYSIS, ROUTINE W REFLEX MICROSCOPIC
Ketones, ur: 15 mg/dL — AB
Leukocytes, UA: NEGATIVE
Nitrite: NEGATIVE
Specific Gravity, Urine: 1.037 — ABNORMAL HIGH (ref 1.005–1.030)
pH: 6.5 (ref 5.0–8.0)

## 2013-04-10 LAB — CBC WITH DIFFERENTIAL/PLATELET
Basophils Relative: 1 % (ref 0–1)
Hemoglobin: 12.2 g/dL — ABNORMAL LOW (ref 13.0–17.0)
MCHC: 34.2 g/dL (ref 30.0–36.0)
Monocytes Relative: 9 % (ref 3–12)
Neutro Abs: 4 10*3/uL (ref 1.7–7.7)
Neutrophils Relative %: 51 % (ref 43–77)
RBC: 4.43 MIL/uL (ref 4.22–5.81)

## 2013-04-10 LAB — LIPASE, BLOOD: Lipase: 16 U/L (ref 11–59)

## 2013-04-10 MED ORDER — MORPHINE SULFATE 4 MG/ML IJ SOLN
INTRAMUSCULAR | Status: AC
  Administered 2013-04-10: 4 mg
  Filled 2013-04-10: qty 1

## 2013-04-10 MED ORDER — SODIUM CHLORIDE 0.9 % IV BOLUS (SEPSIS)
1000.0000 mL | Freq: Once | INTRAVENOUS | Status: AC
  Administered 2013-04-10: 1000 mL via INTRAVENOUS

## 2013-04-10 MED ORDER — MORPHINE SULFATE 4 MG/ML IJ SOLN
4.0000 mg | Freq: Once | INTRAMUSCULAR | Status: AC
  Administered 2013-04-10: 4 mg via INTRAVENOUS
  Filled 2013-04-10: qty 1

## 2013-04-10 MED ORDER — ONDANSETRON HCL 4 MG/2ML IJ SOLN
4.0000 mg | Freq: Once | INTRAMUSCULAR | Status: AC
  Administered 2013-04-10: 4 mg via INTRAVENOUS
  Filled 2013-04-10: qty 2

## 2013-04-10 NOTE — ED Notes (Signed)
Urinal provided.

## 2013-04-10 NOTE — ED Notes (Signed)
Pt states he has a two day history of vomiting and diarrhea.  Diarrhea has resolved.  Vomiting continues.

## 2013-04-10 NOTE — ED Notes (Signed)
Pt reports increased pain in low back, EDP informed and new orders received.

## 2013-04-10 NOTE — ED Provider Notes (Signed)
History    CSN: [id number] Arrival date & time 04/10/13  4098  First MD Initiated Contact with Patient 04/10/13 1056     Chief Complaint  Patient presents with  . Emesis   (Consider location/radiation/quality/duration/timing/severity/associated sxs/prior Treatment) HPI Comments: Patient with diabetes mellitus and a diagnosis of gastroparesis presents with abdominal pain associated nausea and vomiting. He states it started 2 days ago with some diarrhea and has had ongoing vomiting since then. He has pain he describes as a aching pain all over his abdomen. He denies he fevers or chills. He denies any bilious or bloody emesis. He's had multiple ED visits for the same symptoms. He was recently seen at [facility] and had a CT scan of his abdomen and pelvis in June which was normal. He is also recently seen a gastroenterologist with low power GI and diagnosed with gastroparesis. He was scheduled for an upper GI to rule rule out peptic ulcer disease. He's tried his Phenergan at home without relief.  Patient is a 27 y.o. male presenting with vomiting.  Emesis Associated symptoms: abdominal pain   Associated symptoms: no arthralgias, no chills, no diarrhea and no headaches    Past Medical History  Diagnosis Date  . Diabetes mellitus   . GERD (gastroesophageal reflux disease)   . Asthma   . Hx MRSA infection     on face  . Gastroparesis   . Diabetic neuropathy   . Seizures    Past Surgical History  Procedure Laterality Date  . Tonsillectomy     Family History  Problem Relation Age of Onset  . Diabetes Father   . Hypertension Father   . Asthma      fhx  . Hypertension      fhx  . Stroke      fhx  . Heart disease Mother    History  Substance Use Topics  . Smoking status: Current Every Day Smoker    Types: Cigars  . Smokeless tobacco: Never Used     Comment: 2 per day  . Alcohol Use: No    Review of Systems  Constitutional: Negative for fever, chills, diaphoresis  and fatigue.  HENT: Negative for congestion, rhinorrhea and sneezing.   Eyes: Negative.   Respiratory: Negative for cough, chest tightness and shortness of breath.   Cardiovascular: Negative for chest pain and leg swelling.  Gastrointestinal: Positive for nausea, vomiting and abdominal pain. Negative for diarrhea and blood in stool.  Genitourinary: Negative for frequency, hematuria, flank pain and difficulty urinating.  Musculoskeletal: Negative for back pain and arthralgias.       Extremity pain related to his neuropathy  Skin: Negative for rash.  Neurological: Negative for dizziness, speech difficulty, weakness, numbness and headaches.    Allergies  Cefuroxime axetil; Penicillins; Tessalon; and Shellfish allergy  Home Medications   Current Outpatient Rx  Name  Route  Sig  Dispense  Refill  . ciprofloxacin (CIPRO) 500 MG tablet   Oral   Take 1 tablet (500 mg total) by mouth 2 (two) times daily.   14 tablet   0   . ibuprofen (ADVIL,MOTRIN) 200 MG tablet   Oral   Take 200 mg by mouth every 6 (six) hours as needed for pain.         Marland Kitchen insulin aspart (NOVOLOG FLEXPEN) 100 UNIT/ML injection   Subcutaneous   Inject 1-17 Units into the skin 4 (four) times daily -  before meals and at bedtime.   1 vial  6   . insulin glargine (LANTUS) 100 UNIT/ML injection   Subcutaneous   Inject 0.32 mLs (32 Units total) into the skin at bedtime.   10 mL   6   . metoCLOPramide (REGLAN) 10 MG tablet   Oral   Take 1 tablet (10 mg total) by mouth 4 (four) times daily.   120 tablet   1   . metoCLOPramide (REGLAN) 10 MG tablet   Oral   Take 1 tablet (10 mg total) by mouth 3 (three) times daily.   21 tablet   0   . Omeprazole-Sodium Bicarbonate (ZEGERID OTC) 20-1100 MG CAPS   Oral   Take 1 capsule by mouth at bedtime.          Marland Kitchen oxyCODONE-acetaminophen (PERCOCET) 10-325 MG per tablet   Oral   Take 1 tablet by mouth every 4 (four) hours as needed for pain.   20 tablet   0   .  promethazine (PHENERGAN) 25 MG suppository   Rectal   Place 1 suppository (25 mg total) rectally every 6 (six) hours as needed for nausea.   30 each   0   . promethazine (PHENERGAN) 25 MG suppository   Rectal   Place 1 suppository (25 mg total) rectally every 6 (six) hours as needed for nausea.   12 each   0   . zolpidem (AMBIEN) 10 MG tablet   Oral   Take 10 mg by mouth at bedtime as needed for sleep.          BP 127/86  Temp(Src) 98.7 F (37.1 C) (Oral)  Ht 5\' 8"  (1.727 m)  Wt 129 lb (58.514 kg)  BMI 19.62 kg/m2  SpO2 99% Physical Exam  Constitutional: He is oriented to person, place, and time. He appears well-developed and well-nourished.  HENT:  Head: Normocephalic and atraumatic.  Mouth/Throat: Oropharynx is clear and moist.  Eyes: Pupils are equal, round, and reactive to light.  Neck: Normal range of motion. Neck supple.  Cardiovascular: Normal rate, regular rhythm and normal heart sounds.   Pulmonary/Chest: Effort normal and breath sounds normal. No respiratory distress. He has no wheezes. He has no rales. He exhibits no tenderness.  Abdominal: Soft. Bowel sounds are normal. There is tenderness (Moderate diffuse tenderness throughout the abdomen. No CVA tenderness). There is no rebound and no guarding.  Musculoskeletal: Normal range of motion. He exhibits no edema.  Lymphadenopathy:    He has no cervical adenopathy.  Neurological: He is alert and oriented to person, place, and time.  Skin: Skin is warm and dry. No rash noted.  Psychiatric: He has a normal mood and affect.    ED Course  Procedures (including critical care time) Results for orders placed during the hospital encounter of 04/10/13  URINALYSIS, ROUTINE W REFLEX MICROSCOPIC      Result Value Range   Color, Urine YELLOW  YELLOW   APPearance CLEAR  CLEAR   Specific Gravity, Urine 1.037 (*) 1.005 - 1.030   pH 6.5  5.0 - 8.0   Glucose, UA >1000 (*) NEGATIVE mg/dL   Hgb urine dipstick SMALL (*)  NEGATIVE   Bilirubin Urine NEGATIVE  NEGATIVE   Ketones, ur 15 (*) NEGATIVE mg/dL   Protein, ur NEGATIVE  NEGATIVE mg/dL   Urobilinogen, UA 0.2  0.0 - 1.0 mg/dL   Nitrite NEGATIVE  NEGATIVE   Leukocytes, UA NEGATIVE  NEGATIVE  CBC WITH DIFFERENTIAL      Result Value Range   WBC 7.9  4.0 -  10.5 K/uL   RBC 4.43  4.22 - 5.81 MIL/uL   Hemoglobin 12.2 (*) 13.0 - 17.0 g/dL   HCT 54.0 (*) 98.1 - 19.1 %   MCV 80.6  78.0 - 100.0 fL   MCH 27.5  26.0 - 34.0 pg   MCHC 34.2  30.0 - 36.0 g/dL   RDW 47.8  29.5 - 62.1 %   Platelets 398  150 - 400 K/uL   Neutrophils Relative % 51  43 - 77 %   Neutro Abs 4.0  1.7 - 7.7 K/uL   Lymphocytes Relative 35  12 - 46 %   Lymphs Abs 2.7  0.7 - 4.0 K/uL   Monocytes Relative 9  3 - 12 %   Monocytes Absolute 0.7  0.1 - 1.0 K/uL   Eosinophils Relative 4  0 - 5 %   Eosinophils Absolute 0.3  0.0 - 0.7 K/uL   Basophils Relative 1  0 - 1 %   Basophils Absolute 0.1  0.0 - 0.1 K/uL  COMPREHENSIVE METABOLIC PANEL      Result Value Range   Sodium 137  135 - 145 mEq/L   Potassium 3.7  3.5 - 5.1 mEq/L   Chloride 96  96 - 112 mEq/L   CO2 29  19 - 32 mEq/L   Glucose, Bld 382 (*) 70 - 99 mg/dL   BUN 10  6 - 23 mg/dL   Creatinine, Ser 3.08  0.50 - 1.35 mg/dL   Calcium 9.7  8.4 - 65.7 mg/dL   Total Protein 7.9  6.0 - 8.3 g/dL   Albumin 3.4 (*) 3.5 - 5.2 g/dL   AST 17  0 - 37 U/L   ALT 13  0 - 53 U/L   Alkaline Phosphatase 94  39 - 117 U/L   Total Bilirubin 0.5  0.3 - 1.2 mg/dL   GFR calc non Af Amer >90  >90 mL/min   GFR calc Af Amer >90  >90 mL/min  LIPASE, BLOOD      Result Value Range   Lipase 16  11 - 59 U/L  URINE MICROSCOPIC-ADD ON      Result Value Range   Squamous Epithelial / LPF RARE  RARE   RBC / HPF 7-10  <3 RBC/hpf   Bacteria, UA RARE  RARE     No results found. 1. Gastroparesis     MDM  Patient is feeling much better after IV fluids pain medication Zofran. He had no further episodes of vomiting. He's sitting up playing video games. His  abdominal exam is non-concerning. He was discharged home in good condition. He has Phenergan to use at home. He was advised to followup with his gastroenterologist as needed.  Rolan Bucco, MD 04/10/13 [phone]

## 2013-04-11 ENCOUNTER — Encounter (HOSPITAL_COMMUNITY): Payer: Self-pay | Admitting: Acute Care

## 2013-04-11 ENCOUNTER — Inpatient Hospital Stay (HOSPITAL_COMMUNITY): Payer: Medicaid Other

## 2013-04-11 ENCOUNTER — Emergency Department (HOSPITAL_COMMUNITY): Payer: Medicaid Other

## 2013-04-11 ENCOUNTER — Inpatient Hospital Stay (HOSPITAL_COMMUNITY)
Admission: EM | Admit: 2013-04-11 | Discharge: 2013-04-14 | DRG: 639 | Disposition: A | Discharge status: Home / Self Care | Payer: Medicaid Other | Attending: Internal Medicine | Admitting: Internal Medicine

## 2013-04-11 DIAGNOSIS — Z720 Tobacco use: Secondary | ICD-10-CM

## 2013-04-11 DIAGNOSIS — E1049 Type 1 diabetes mellitus with other diabetic neurological complication: Secondary | ICD-10-CM | POA: Diagnosis present

## 2013-04-11 DIAGNOSIS — E1010 Type 1 diabetes mellitus with ketoacidosis without coma: Secondary | ICD-10-CM | POA: Diagnosis present

## 2013-04-11 DIAGNOSIS — Z833 Family history of diabetes mellitus: Secondary | ICD-10-CM

## 2013-04-11 DIAGNOSIS — E860 Dehydration: Secondary | ICD-10-CM | POA: Diagnosis present

## 2013-04-11 DIAGNOSIS — K219 Gastro-esophageal reflux disease without esophagitis: Secondary | ICD-10-CM

## 2013-04-11 DIAGNOSIS — E1110 Type 2 diabetes mellitus with ketoacidosis without coma: Secondary | ICD-10-CM | POA: Diagnosis present

## 2013-04-11 DIAGNOSIS — K047 Periapical abscess without sinus: Secondary | ICD-10-CM

## 2013-04-11 DIAGNOSIS — Z9119 Patient's noncompliance with other medical treatment and regimen: Secondary | ICD-10-CM

## 2013-04-11 DIAGNOSIS — F17200 Nicotine dependence, unspecified, uncomplicated: Secondary | ICD-10-CM | POA: Diagnosis present

## 2013-04-11 DIAGNOSIS — E1142 Type 2 diabetes mellitus with diabetic polyneuropathy: Secondary | ICD-10-CM | POA: Diagnosis present

## 2013-04-11 DIAGNOSIS — E1065 Type 1 diabetes mellitus with hyperglycemia: Secondary | ICD-10-CM | POA: Diagnosis present

## 2013-04-11 DIAGNOSIS — R112 Nausea with vomiting, unspecified: Secondary | ICD-10-CM | POA: Diagnosis present

## 2013-04-11 DIAGNOSIS — K3184 Gastroparesis: Secondary | ICD-10-CM

## 2013-04-11 DIAGNOSIS — E1100 Type 2 diabetes mellitus with hyperosmolarity without nonketotic hyperglycemic-hyperosmolar coma (NKHHC): Secondary | ICD-10-CM

## 2013-04-11 DIAGNOSIS — Z9114 Patient's other noncompliance with medication regimen: Secondary | ICD-10-CM

## 2013-04-11 DIAGNOSIS — E1040 Type 1 diabetes mellitus with diabetic neuropathy, unspecified: Secondary | ICD-10-CM

## 2013-04-11 DIAGNOSIS — Z880 Allergy status to penicillin: Secondary | ICD-10-CM

## 2013-04-11 DIAGNOSIS — L0201 Cutaneous abscess of face: Secondary | ICD-10-CM

## 2013-04-11 DIAGNOSIS — Z794 Long term (current) use of insulin: Secondary | ICD-10-CM

## 2013-04-11 DIAGNOSIS — R739 Hyperglycemia, unspecified: Secondary | ICD-10-CM

## 2013-04-11 DIAGNOSIS — J069 Acute upper respiratory infection, unspecified: Secondary | ICD-10-CM

## 2013-04-11 DIAGNOSIS — J45909 Unspecified asthma, uncomplicated: Secondary | ICD-10-CM

## 2013-04-11 DIAGNOSIS — S43499A Other sprain of unspecified shoulder joint, initial encounter: Secondary | ICD-10-CM

## 2013-04-11 DIAGNOSIS — L0390 Cellulitis, unspecified: Secondary | ICD-10-CM

## 2013-04-11 DIAGNOSIS — Z91199 Patient's noncompliance with other medical treatment and regimen due to unspecified reason: Secondary | ICD-10-CM

## 2013-04-11 DIAGNOSIS — E1140 Type 2 diabetes mellitus with diabetic neuropathy, unspecified: Secondary | ICD-10-CM

## 2013-04-11 DIAGNOSIS — Z9111 Patient's noncompliance with dietary regimen: Secondary | ICD-10-CM

## 2013-04-11 LAB — GLUCOSE, CAPILLARY
Glucose-Capillary: 599 mg/dL (ref 70–99)
Glucose-Capillary: 424 mg/dL — ABNORMAL HIGH (ref 70–99)
Glucose-Capillary: 360 mg/dL — ABNORMAL HIGH (ref 70–99)
Glucose-Capillary: 304 mg/dL — ABNORMAL HIGH (ref 70–99)
Glucose-Capillary: 259 mg/dL — ABNORMAL HIGH (ref 70–99)

## 2013-04-11 LAB — BASIC METABOLIC PANEL
BUN: 24 mg/dL — ABNORMAL HIGH (ref 6–23)
CO2: 16 mEq/L — ABNORMAL LOW (ref 19–32)
CO2: 12 mEq/L — ABNORMAL LOW (ref 19–32)
CO2: 19 mEq/L (ref 19–32)
Chloride: 103 mEq/L (ref 96–112)
Chloride: 106 mEq/L (ref 96–112)
Creatinine, Ser: 1.02 mg/dL (ref 0.50–1.35)
GFR calc Af Amer: >90 mL/min (ref >90)
GFR calc non Af Amer: >90 mL/min (ref >90)
GFR calc non Af Amer: >90 mL/min (ref >90)
Glucose, Bld: 605 mg/dL (ref 70–99)
Glucose, Bld: 556 mg/dL (ref 70–99)
Glucose, Bld: 407 mg/dL — ABNORMAL HIGH (ref 70–99)
Potassium: 4.4 mEq/L (ref 3.5–5.1)
Potassium: 4.5 mEq/L (ref 3.5–5.1)
Potassium: 3.6 mEq/L (ref 3.5–5.1)
Potassium: 3.6 mEq/L (ref 3.5–5.1)
Sodium: 137 mEq/L (ref 135–145)
Sodium: 140 mEq/L (ref 135–145)
Sodium: 141 mEq/L (ref 135–145)

## 2013-04-11 LAB — URINALYSIS, ROUTINE W REFLEX MICROSCOPIC
Glucose, UA: >1000 mg/dL — AB
Leukocytes, UA: NEGATIVE
Specific Gravity, Urine: 1.028 (ref 1.005–1.030)
Urobilinogen, UA: 0.2 mg/dL (ref 0.0–1.0)

## 2013-04-11 LAB — CBC WITH DIFFERENTIAL/PLATELET
Basophils Absolute: 0.1 10*3/uL (ref 0.0–0.1)
Lymphocytes Relative: 20 % (ref 12–46)
Lymphs Abs: 1.8 10*3/uL (ref 0.7–4.0)
Neutro Abs: 6.3 10*3/uL (ref 1.7–7.7)
Neutrophils Relative %: 73 % (ref 43–77)
Platelets: 371 10*3/uL (ref 150–400)
RBC: 4.17 MIL/uL — ABNORMAL LOW (ref 4.22–5.81)
WBC: 8.6 10*3/uL (ref 4.0–10.5)

## 2013-04-11 LAB — CBC
HCT: 32.3 % — ABNORMAL LOW (ref 39.0–52.0)
Hemoglobin: 10.5 g/dL — ABNORMAL LOW (ref 13.0–17.0)
MCH: 27.2 pg (ref 26.0–34.0)
MCV: 83.7 fL (ref 78.0–100.0)
RBC: 3.86 MIL/uL — ABNORMAL LOW (ref 4.22–5.81)
WBC: 10.3 10*3/uL (ref 4.0–10.5)

## 2013-04-11 LAB — POCT I-STAT 3, VENOUS BLOOD GAS (G3P V)
Acid-base deficit: 13 mmol/L — ABNORMAL HIGH (ref 0.0–2.0)
pCO2, Ven: 35.9 mmHg — ABNORMAL LOW (ref 45.0–50.0)
pO2, Ven: 65 mmHg — ABNORMAL HIGH (ref 30.0–45.0)

## 2013-04-11 MED ORDER — DEXTROSE 50 % IV SOLN: 25.0000 mL | INTRAVENOUS | Status: DC | PRN

## 2013-04-11 MED ORDER — SODIUM CHLORIDE 0.9 % IV SOLN
INTRAVENOUS | Status: DC
  Administered 2013-04-11: 4.6 [IU]/h via INTRAVENOUS
  Filled 2013-04-11: qty 1

## 2013-04-11 MED ORDER — SODIUM CHLORIDE 0.9 % IV SOLN
INTRAVENOUS | Status: DC
  Administered 2013-04-11: 8.9 [IU]/h via INTRAVENOUS
  Administered 2013-04-11: 9 [IU]/h via INTRAVENOUS
  Administered 2013-04-11: 10 [IU]/h via INTRAVENOUS
  Administered 2013-04-11: 7.3 [IU]/h via INTRAVENOUS
  Filled 2013-04-11: qty 1

## 2013-04-11 MED ORDER — DEXTROSE-NACL 5-0.45 % IV SOLN: INTRAVENOUS | Status: DC

## 2013-04-11 MED ORDER — ENOXAPARIN SODIUM 40 MG/0.4ML ~~LOC~~ SOLN
40.0000 mg | SUBCUTANEOUS | Status: DC
  Administered 2013-04-11 – 2013-04-12 (×2): 40 mg via SUBCUTANEOUS
  Filled 2013-04-11 (×4): qty 0.4

## 2013-04-11 MED ORDER — SODIUM CHLORIDE 0.9 % IV SOLN
INTRAVENOUS | Status: DC
  Administered 2013-04-11 – 2013-04-13 (×5): via INTRAVENOUS

## 2013-04-11 MED ORDER — SODIUM CHLORIDE 0.9 % IV SOLN
1000.0000 mL | Freq: Once | INTRAVENOUS | Status: AC
  Administered 2013-04-11: 1000 mL via INTRAVENOUS

## 2013-04-11 MED ORDER — HYDROMORPHONE HCL PF 1 MG/ML IJ SOLN
1.0000 mg | INTRAMUSCULAR | Status: DC | PRN
  Administered 2013-04-11 – 2013-04-14 (×12): 1 mg via INTRAVENOUS
  Filled 2013-04-11 (×12): qty 1

## 2013-04-11 MED ORDER — HYDROMORPHONE HCL PF 1 MG/ML IJ SOLN
1.0000 mg | Freq: Once | INTRAMUSCULAR | Status: AC
  Administered 2013-04-11: 1 mg via INTRAVENOUS
  Filled 2013-04-11: qty 1

## 2013-04-11 MED ORDER — ONDANSETRON HCL 4 MG/2ML IJ SOLN
4.0000 mg | Freq: Once | INTRAMUSCULAR | Status: AC
  Administered 2013-04-11: 4 mg via INTRAVENOUS
  Filled 2013-04-11: qty 2

## 2013-04-11 MED ORDER — SODIUM CHLORIDE 0.9 % IV BOLUS (SEPSIS)
1000.0000 mL | Freq: Once | INTRAVENOUS | Status: AC
  Administered 2013-04-11: 1000 mL via INTRAVENOUS

## 2013-04-11 MED ORDER — PROMETHAZINE HCL 25 MG/ML IJ SOLN
12.5000 mg | Freq: Four times a day (QID) | INTRAMUSCULAR | Status: DC | PRN
Start: 2013-04-11 — End: 2013-04-13
  Filled 2013-04-11: qty 1

## 2013-04-11 MED ORDER — SODIUM CHLORIDE 0.9 % IV SOLN
INTRAVENOUS | Status: AC
  Administered 2013-04-11: 18:00:00 via INTRAVENOUS

## 2013-04-11 MED ORDER — PANTOPRAZOLE SODIUM 40 MG IV SOLR
40.0000 mg | Freq: Two times a day (BID) | INTRAVENOUS | Status: DC
  Administered 2013-04-11 – 2013-04-12 (×3): 40 mg via INTRAVENOUS
  Filled 2013-04-11 (×8): qty 40

## 2013-04-11 MED ORDER — DEXTROSE-NACL 5-0.45 % IV SOLN
INTRAVENOUS | Status: DC
  Administered 2013-04-11: 23:00:00 via INTRAVENOUS

## 2013-04-11 MED ORDER — ONDANSETRON HCL 4 MG/2ML IJ SOLN
4.0000 mg | INTRAMUSCULAR | Status: DC | PRN
  Administered 2013-04-11 – 2013-04-13 (×5): 4 mg via INTRAVENOUS
  Filled 2013-04-11 (×6): qty 2

## 2013-04-11 NOTE — H&P (Signed)
History and Physical       Hospital Admission Note Date: 04/11/2013  Patient name: Jason [name] Medical record number: [id number] Date of birth: [birth year] Age: 27 y.o. Gender: male PCP: Nelwyn Salisbury, MD    Chief Complaint:  Nausea, vomiting with hyperglycemia  HPI: Patient is a 27 year old African American male with insulin-dependent diabetes, gastroparesis, severe GERD presented to ED with intractable nausea, vomiting (7-8 times today), diffuse abdominal pain and generalized weakness. Per patient his symptoms this started several days ago and was seen in med center [facility] yesterday but improved with the supportive treatment. However last night the symptoms returned back with intractable nausea and vomiting and abdominal pain. EMS was called in blood sugar was found to be greater than 600. Patient was found to be in DKA with bicarbonate of 16, UA positive for ketones, blood glucose of 605, anion gap of 29. Hospitalist service was requested for admission.   Review of Systems:  Constitutional: Denies fever, chills, diaphoresis,+ poor appetite and fatigue.  HEENT: Denies photophobia, eye pain, redness, hearing loss, ear pain, congestion, sore throat, rhinorrhea, sneezing, mouth sores, trouble swallowing, neck pain, neck stiffness and tinnitus.   Respiratory: Denies SOB, DOE, cough, chest tightness,  and wheezing.   Cardiovascular: Denies chest pain, palpitations and leg swelling.  Gastrointestinal: Please see history of present illness  Genitourinary: Denies dysuria, urgency, frequency, hematuria, flank pain and difficulty urinating.  Musculoskeletal: Denies myalgias, back pain, joint swelling, arthralgias and gait problem.  Skin: Denies pallor, rash and wound.  Neurological: Denies dizziness, seizures, syncope, light-headedness, numbness and headaches. + weakness Hematological: Denies adenopathy. Easy bruising, personal or family  bleeding history  Psychiatric/Behavioral: Denies suicidal ideation, mood changes, confusion, nervousness, sleep disturbance and agitation  Past Medical History: Past Medical History  Diagnosis Date  . Diabetes mellitus   . GERD (gastroesophageal reflux disease)   . Asthma   . Hx MRSA infection     on face  . Gastroparesis   . Diabetic neuropathy   . Seizures    Past Surgical History  Procedure Laterality Date  . Tonsillectomy      Medications: Prior to Admission medications   Medication Sig Start Date End Date Taking? Authorizing Provider  ciprofloxacin (CIPRO) 500 MG tablet Take 1 tablet (500 mg total) by mouth 2 (two) times daily. 04/01/13  Yes April K Palumbo-Rasch, MD  ibuprofen (ADVIL,MOTRIN) 200 MG tablet Take 200 mg by mouth every 6 (six) hours as needed for pain.   Yes Historical Provider, MD  insulin aspart (NOVOLOG FLEXPEN) 100 UNIT/ML injection Inject 1-17 Units into the skin 4 (four) times daily -  before meals and at bedtime. 01/30/13  Yes Marianne L York, PA-C  insulin glargine (LANTUS) 100 UNIT/ML injection Inject 0.32 mLs (32 Units total) into the skin at bedtime. 01/30/13  Yes Tora Kindred York, PA-C  metoCLOPramide (REGLAN) 10 MG tablet Take 1 tablet (10 mg total) by mouth 3 (three) times daily. 04/01/13  Yes April K Palumbo-Rasch, MD  Omeprazole-Sodium Bicarbonate (ZEGERID OTC) 20-1100 MG CAPS Take 1 capsule by mouth at bedtime.    Yes Historical Provider, MD  oxyCODONE-acetaminophen (PERCOCET) 10-325 MG per tablet Take 1 tablet by mouth every 4 (four) hours as needed for pain. 03/09/13  Yes John L Molpus, MD  promethazine (PHENERGAN) 25 MG suppository Place 1 suppository (25 mg total) rectally every 6 (six) hours as needed for nausea. 03/30/13  Yes John L Molpus, MD  zolpidem (AMBIEN) 10 MG tablet Take 10 mg by mouth  at bedtime as needed for sleep.   Yes Historical Provider, MD    Allergies:   Allergies  Allergen Reactions  . Cefuroxime Axetil Anaphylaxis  .  Penicillins Anaphylaxis  . Tessalon (Benzonatate) Anaphylaxis    Can take amoxicillin  . Shellfish Allergy Itching    Social History:  reports that he has been smoking Cigars.  He has never used smokeless tobacco. He reports that he does not drink alcohol or use illicit drugs.  Family History: Family History  Problem Relation Age of Onset  . Diabetes Father   . Hypertension Father   . Asthma      fhx  . Hypertension      fhx  . Stroke      fhx  . Heart disease Mother     Physical Exam: Blood pressure 113/61, pulse 120, temperature 98.7 F (37.1 C), temperature source Oral, resp. rate 17, SpO2 100.00%. General: Alert, awake, oriented x3, in no acute distress. HEENT: normocephalic, atraumatic, anicteric sclera, pink conjunctiva, pupils equal and reactive to light and accomodation, oropharynx clear Neck: supple, no masses or lymphadenopathy, no goiter, no bruits  Heart: Regular rate and rhythm, without murmurs, rubs or gallops. Lungs: Clear to auscultation bilaterally, no wheezing, rales or rhonchi. Abdomen: Soft, diffusely tender, nondistended, with voluntary guarding, positive bowel sounds, no masses. Extremities: No clubbing, cyanosis or edema with positive pedal pulses. Neuro: Grossly intact, no focal neurological deficits, strength 5/5 upper and lower extremities bilaterally Psych: alert and oriented x 3, normal mood and affect Skin: no rashes or lesions, warm and dry   LABS on Admission:  Basic Metabolic Panel:  Recent Labs Lab 04/10/13 1030 04/11/13 1233  NA 137 137  K 3.7 4.4  CL 96 92*  CO2 29 16*  GLUCOSE 382* 605*  BUN 10 21  CREATININE 0.90 1.08  CALCIUM 9.7 9.1   Liver Function Tests:  Recent Labs Lab 04/10/13 1030  AST 17  ALT 13  ALKPHOS 94  BILITOT 0.5  PROT 7.9  ALBUMIN 3.4*    Recent Labs Lab 04/10/13 1030  LIPASE 16   No results found for this basename: AMMONIA,  in the last 168 hours CBC:  Recent Labs Lab 04/10/13 1030  04/11/13 1233  WBC 7.9 8.6  NEUTROABS 4.0 6.3  HGB 12.2* 11.2*  HCT 35.7* 34.2*  MCV 80.6 82.0  PLT 398 371   CBG:  Recent Labs Lab 04/11/13 1216 04/11/13 1447  GLUCAP 599* 483*     Radiological Exams on Admission: Dg Chest 2 View  04/11/2013   *RADIOLOGY REPORT*  Clinical Data: Chest pain and shortness of breath  CHEST - 2 VIEW  Comparison: March 20, 2013  Findings:  Lungs clear.  Heart size and pulmonary vascularity are normal.  No adenopathy.  No pneumothorax.  No bone lesions.  IMPRESSION: No abnormality noted.   Original Report Authenticated By: Bretta Bang, M.D.     Assessment/Plan Principal Problem:   DKA (diabetic ketoacidoses) with dehydration -Admit to step down, place on aggressive IV fluid hydration and insulin drip per DKA protocol  - Will transitioned to subcutaneous Lantus and NovoLog insulin once AG is  closed and patient's symptoms are improved.  - Continue antiemetics and pain control  Active Problems:   GERD - Place on IV Protonix q12 hours  Abdominal pain: Possibly secondary to DKA however rule out acute abdominal pathology with CT abdomen and pelvis - Continue pain control, n.p.o., aggressive IV fluid, pain control    Noncompliance with diet  and medication regimen  DVT prophylaxis: Lovenox  CODE STATUS: FULL CODE STATUS  Further plan will depend as patient's clinical course evolves and further radiologic and laboratory data become available.   Time Spent on Admission:  Zuhayr Deeney M.D. [facility] 04/11/2013, 2:56 PM Pager: [phone]  If 7PM-7AM, please contact night-coverage [email or web address] Password [email or web address]

## 2013-04-11 NOTE — ED Notes (Signed)
Pt treated at [facility] yesterday for hyperglycemia.  EMS called today for c/o hyperglycemia and weakness.  EMS reports that their CBG monitor only reads to 500, once higher it reads "High".  Pt's CBG reads "HIGH".  EMS also reports pt is extremely weak and they had to pick pt up off of floor.  20G IV started and approximately 200 to NS given.  Pt also c/o back pain.

## 2013-04-11 NOTE — ED Notes (Signed)
Pt was given ice chips per RN Melony Overly

## 2013-04-11 NOTE — ED Notes (Signed)
CBG 599. 

## 2013-04-11 NOTE — ED Provider Notes (Signed)
Medical screening examination/treatment/procedure(s) were conducted as a shared visit with non-physician practitioner(s) and myself.  I personally evaluated the patient during the encounter   .Face to face Exam:  General:  A&Ox3 HEENT:  Atraumatic Resp:  Normal effort Abd:  Nondistended Neuro:No focal deficits   CRITICAL CARE Performed by: Nelva Nay L Total critical care time: 30 min Critical care time was exclusive of separately billable procedures and treating other patients. Critical care was necessary to treat or prevent imminent or life-threatening deterioration. Critical care was time spent personally by me on the following activities: development of treatment plan with patient and/or surrogate as well as nursing, discussions with consultants, evaluation of patient's response to treatment, examination of patient, obtaining history from patient or surrogate, ordering and performing treatments and interventions, ordering and review of laboratory studies, ordering and review of radiographic studies, pulse oximetry and re-evaluation of patient's condition.   Nelia Shi, MD 04/11/13 2051

## 2013-04-11 NOTE — ED Provider Notes (Signed)
History    CSN: [id number] Arrival date & time 04/11/13  1206  First MD Initiated Contact with Patient 04/11/13 1207     Chief Complaint  Patient presents with  . Hyperglycemia  . Weakness   (Consider location/radiation/quality/duration/timing/severity/associated sxs/prior Treatment) HPI Comments: Patient with history of insulin-dependent diabetes, gastroparesis -- presents with complaint of vomiting, diffuse abdominal pain, weakness. Symptoms started several days ago and patient was seen in [facility] yesterday for the same. He was given fluids, pain medication with improvement in symptoms. Patient states that overnight the symptoms became worse. He states that he has vomited 7 or 8 times today. EMS was called and blood sugar was found to be greater than 600. Patient had to be picked up off the floor onto the stretcher. Patient denies fever, cold symptoms. He has a cough. No diarrhea. Onset of symptoms gradual. Course is worsening. Nothing makes symptoms better or worse.  Patient is a 27 y.o. male presenting with hyperglycemia and weakness. The history is provided by the patient and medical records.  Hyperglycemia Associated symptoms: abdominal pain, nausea and vomiting   Associated symptoms: no chest pain, no dysuria and no fever   Weakness Associated symptoms include abdominal pain, coughing, nausea, vomiting and weakness. Pertinent negatives include no chest pain, fever, headaches, myalgias, rash or sore throat.   Past Medical History  Diagnosis Date  . Diabetes mellitus   . GERD (gastroesophageal reflux disease)   . Asthma   . Hx MRSA infection     on face  . Gastroparesis   . Diabetic neuropathy   . Seizures    Past Surgical History  Procedure Laterality Date  . Tonsillectomy     Family History  Problem Relation Age of Onset  . Diabetes Father   . Hypertension Father   . Asthma      fhx  . Hypertension      fhx  . Stroke      fhx  . Heart disease  Mother    History  Substance Use Topics  . Smoking status: Current Every Day Smoker    Types: Cigars  . Smokeless tobacco: Never Used     Comment: 2 per day  . Alcohol Use: No    Review of Systems  Constitutional: Negative for fever.  HENT: Negative for sore throat and rhinorrhea.   Eyes: Negative for redness.  Respiratory: Positive for cough.   Cardiovascular: Negative for chest pain.  Gastrointestinal: Positive for nausea, vomiting and abdominal pain. Negative for diarrhea and blood in stool.  Genitourinary: Negative for dysuria.  Musculoskeletal: Negative for myalgias.  Skin: Negative for rash.  Neurological: Positive for weakness. Negative for headaches.    Allergies  Cefuroxime axetil; Penicillins; Tessalon; and Shellfish allergy  Home Medications   Current Outpatient Rx  Name  Route  Sig  Dispense  Refill  . ciprofloxacin (CIPRO) 500 MG tablet   Oral   Take 1 tablet (500 mg total) by mouth 2 (two) times daily.   14 tablet   0   . ibuprofen (ADVIL,MOTRIN) 200 MG tablet   Oral   Take 200 mg by mouth every 6 (six) hours as needed for pain.         Marland Kitchen insulin aspart (NOVOLOG FLEXPEN) 100 UNIT/ML injection   Subcutaneous   Inject 1-17 Units into the skin 4 (four) times daily -  before meals and at bedtime.   1 vial   6   . insulin glargine (LANTUS) 100 UNIT/ML  injection   Subcutaneous   Inject 0.32 mLs (32 Units total) into the skin at bedtime.   10 mL   6   . metoCLOPramide (REGLAN) 10 MG tablet   Oral   Take 1 tablet (10 mg total) by mouth 4 (four) times daily.   120 tablet   1   . metoCLOPramide (REGLAN) 10 MG tablet   Oral   Take 1 tablet (10 mg total) by mouth 3 (three) times daily.   21 tablet   0   . Omeprazole-Sodium Bicarbonate (ZEGERID OTC) 20-1100 MG CAPS   Oral   Take 1 capsule by mouth at bedtime.          Marland Kitchen oxyCODONE-acetaminophen (PERCOCET) 10-325 MG per tablet   Oral   Take 1 tablet by mouth every 4 (four) hours as needed for  pain.   20 tablet   0   . promethazine (PHENERGAN) 25 MG suppository   Rectal   Place 1 suppository (25 mg total) rectally every 6 (six) hours as needed for nausea.   30 each   0   . promethazine (PHENERGAN) 25 MG suppository   Rectal   Place 1 suppository (25 mg total) rectally every 6 (six) hours as needed for nausea.   12 each   0   . zolpidem (AMBIEN) 10 MG tablet   Oral   Take 10 mg by mouth at bedtime as needed for sleep.          BP 115/62  Temp(Src) 98.6 F (37 C) (Oral)  Resp 16  SpO2 100% Physical Exam  Nursing note and vitals reviewed. Constitutional: He appears well-developed and well-nourished.  HENT:  Head: Normocephalic and atraumatic.  Mouth/Throat: Uvula is midline. Mucous membranes are dry.  Eyes: Conjunctivae are normal. Right eye exhibits no discharge. Left eye exhibits no discharge.  Neck: Normal range of motion. Neck supple.  Cardiovascular: Regular rhythm and normal heart sounds.  Tachycardia present.   No murmur heard. Pulmonary/Chest: Effort normal and breath sounds normal. No respiratory distress. He has no wheezes. He has no rales.  Abdominal: Soft. He exhibits no distension. Bowel sounds are decreased. There is generalized tenderness (moderate). There is no rebound and no guarding.  Neurological: He is alert.  Skin: Skin is warm and dry.  Psychiatric: He has a normal mood and affect.    ED Course  Procedures (including critical care time) Labs Reviewed  GLUCOSE, CAPILLARY - Abnormal; Notable for the following:    Glucose-Capillary 599 (*)    All other components within normal limits  CBC WITH DIFFERENTIAL - Abnormal; Notable for the following:    RBC 4.17 (*)    Hemoglobin 11.2 (*)    HCT 34.2 (*)    All other components within normal limits  BASIC METABOLIC PANEL - Abnormal; Notable for the following:    Chloride 92 (*)    CO2 16 (*)    Glucose, Bld 605 (*)    All other components within normal limits  URINALYSIS, ROUTINE W  REFLEX MICROSCOPIC - Abnormal; Notable for the following:    Glucose, UA >1000 (*)    Hgb urine dipstick TRACE (*)    Ketones, ur >80 (*)    All other components within normal limits  GLUCOSE, CAPILLARY - Abnormal; Notable for the following:    Glucose-Capillary 483 (*)    All other components within normal limits  URINE MICROSCOPIC-ADD ON  BLOOD GAS, VENOUS   Dg Chest 2 View  04/11/2013   *RADIOLOGY  REPORT*  Clinical Data: Chest pain and shortness of breath  CHEST - 2 VIEW  Comparison: March 20, 2013  Findings:  Lungs clear.  Heart size and pulmonary vascularity are normal.  No adenopathy.  No pneumothorax.  No bone lesions.  IMPRESSION: No abnormality noted.   Original Report Authenticated By: Bretta Bang, M.D.   1. DKA (diabetic ketoacidoses)   2. Diabetic gastroparesis     12:46 PM Patient seen and examined. Work-up initiated. Medications ordered.   Vital signs reviewed and are as follows: Filed Vitals:   04/11/13 1222  BP: 115/62  Temp: 98.6 F (37 C)  Resp: 16   3:33 PM DKA, 3L NS ordered to start. Hospitalist, Dr. Isidoro Donning, to see and admit.    MDM  Admit DKA.   Renne Crigler, PA-C 04/11/13 [phone]

## 2013-04-11 NOTE — ED Notes (Signed)
Pt knows that urine is needed 

## 2013-04-12 DIAGNOSIS — R7309 Other abnormal glucose: Secondary | ICD-10-CM

## 2013-04-12 DIAGNOSIS — E1110 Type 2 diabetes mellitus with ketoacidosis without coma: Secondary | ICD-10-CM

## 2013-04-12 DIAGNOSIS — E860 Dehydration: Secondary | ICD-10-CM

## 2013-04-12 DIAGNOSIS — E1149 Type 2 diabetes mellitus with other diabetic neurological complication: Secondary | ICD-10-CM

## 2013-04-12 DIAGNOSIS — K3184 Gastroparesis: Secondary | ICD-10-CM

## 2013-04-12 LAB — BASIC METABOLIC PANEL
CO2: 21 mEq/L (ref 19–32)
Calcium: 8.3 mg/dL — ABNORMAL LOW (ref 8.4–10.5)
Calcium: 8.3 mg/dL — ABNORMAL LOW (ref 8.4–10.5)
Chloride: 106 mEq/L (ref 96–112)
Creatinine, Ser: 0.86 mg/dL (ref 0.50–1.35)
GFR calc Af Amer: >90 mL/min (ref >90)
Glucose, Bld: 158 mg/dL — ABNORMAL HIGH (ref 70–99)
Sodium: 139 mEq/L (ref 135–145)

## 2013-04-12 LAB — GLUCOSE, CAPILLARY
Glucose-Capillary: 100 mg/dL — ABNORMAL HIGH (ref 70–99)
Glucose-Capillary: 80 mg/dL (ref 70–99)
Glucose-Capillary: 92 mg/dL (ref 70–99)
Glucose-Capillary: 142 mg/dL — ABNORMAL HIGH (ref 70–99)

## 2013-04-12 MED ORDER — OXYCODONE-ACETAMINOPHEN 10-325 MG PO TABS: 1.0000 | ORAL_TABLET | ORAL | Status: DC | PRN

## 2013-04-12 MED ORDER — INSULIN ASPART 100 UNIT/ML ~~LOC~~ SOLN: 0.0000 [IU] | Freq: Every day | SUBCUTANEOUS | Status: DC

## 2013-04-12 MED ORDER — PREGABALIN 50 MG PO CAPS: 100.0000 mg | ORAL_CAPSULE | Freq: Two times a day (BID) | ORAL | Status: DC

## 2013-04-12 MED ORDER — INSULIN ASPART 100 UNIT/ML ~~LOC~~ SOLN
0.0000 [IU] | Freq: Three times a day (TID) | SUBCUTANEOUS | Status: DC
  Administered 2013-04-12 – 2013-04-14 (×5): 1 [IU] via SUBCUTANEOUS

## 2013-04-12 MED ORDER — PREGABALIN 100 MG PO CAPS
100.0000 mg | ORAL_CAPSULE | Freq: Three times a day (TID) | ORAL | Status: DC
  Administered 2013-04-12 – 2013-04-14 (×6): 100 mg via ORAL
  Filled 2013-04-12 (×6): qty 1

## 2013-04-12 MED ORDER — GLUCERNA SHAKE PO LIQD
237.0000 mL | Freq: Three times a day (TID) | ORAL | Status: DC
  Administered 2013-04-12 – 2013-04-14 (×6): 237 mL via ORAL

## 2013-04-12 MED ORDER — INSULIN GLARGINE 100 UNIT/ML ~~LOC~~ SOLN
20.0000 [IU] | Freq: Every day | SUBCUTANEOUS | Status: DC
  Administered 2013-04-12 (×2): 20 [IU] via SUBCUTANEOUS
  Filled 2013-04-12 (×3): qty 0.2

## 2013-04-12 MED ORDER — GLUCERNA PO LIQD: 237.0000 mL | Freq: Three times a day (TID) | ORAL | Status: DC

## 2013-04-12 MED ORDER — OXYCODONE HCL ER 10 MG PO T12A
20.0000 mg | EXTENDED_RELEASE_TABLET | Freq: Two times a day (BID) | ORAL | Status: DC
  Administered 2013-04-12 – 2013-04-13 (×3): 20 mg via ORAL
  Filled 2013-04-12 (×3): qty 2

## 2013-04-12 NOTE — Progress Notes (Signed)
Attempted to call for report.  RN currently busy, but will call back later.

## 2013-04-12 NOTE — Progress Notes (Addendum)
[facility] Progress Note [place] TEAM 1 - Stepdown/ICU TEAM   Jason [name] AVW:[id number] DOB: May 17, 1986 DOA: 04/11/2013 PCP: Nelwyn Salisbury, MD  Brief narrative: Patient is a 27 year old African American male with insulin-dependent diabetes, gastroparesis, severe GERD presented to ED with intractable nausea, vomiting (7-8 times today), diffuse abdominal pain and generalized weakness. Per patient his symptoms this started several days ago and was seen in med center [facility] yesterday but improved with the supportive treatment. However last night the symptoms returned back with intractable nausea and vomiting and abdominal pain. EMS was called in blood sugar was found to be greater than 600. Patient was found to be in DKA with bicarbonate of 16, UA positive for ketones, blood glucose of 605, anion gap of 29. Hospitalist service was requested for admission.    Assessment/Plan: Principal Problem:   DKA (diabetic ketoacidoses) - will resume Lanutus at lower dose today due to poor appetite - Low dose Novolog sliding scale as well - has been explained that if he has problems with gastroparesis again, he will not stop his Lantus  Active Problems:   GERD - cont home meds- protonix    Nausea and vomiting/ Gastroparesis - on reglan as outpt but not effective - has f/u with Dr Rhea Belton later this month for EGD    Dehydration - cont to hydrate today    Code Status: full code Family Communication: none Disposition Plan: transfer out of SDU and likely home in AM  Consultants: none  Procedures: none  Antibiotics: none  DVT prophylaxis: Lovenox  HPI/Subjective: Alert- mild nausea but no vomiting- wanting to not eat much solids today- no abd pain- tells me his gastroparesis was "acting up" and resulting in the vomiting.    Objective: Blood pressure 128/91, pulse 96, temperature 98.8 F (37.1 C), temperature source Oral, resp. rate 12, height 5\' 8"  (1.727 m), weight  40.2 kg (88 lb 10 oz), SpO2 100.00%.  Intake/Output Summary (Last 24 hours) at 04/12/13 1521 Last data filed at 04/12/13 1400  Gross per 24 hour  Intake   3930 ml  Output   1750 ml  Net   2180 ml     Exam: General: No acute respiratory distress Lungs: Clear to auscultation bilaterally without wheezes or crackles Cardiovascular: Regular rate and rhythm without murmur gallop or rub normal S1 and S2 Abdomen: Nontender, nondistended, soft, bowel sounds positive, no rebound, no ascites, no appreciable mass Extremities: No significant cyanosis, clubbing, or edema bilateral lower extremities  Data Reviewed: Basic Metabolic Panel:  Recent Labs Lab 04/11/13 1651 04/11/13 1824 04/11/13 2022 04/12/13 04/12/13 0422  NA 140 141 141 139 140  K 4.5 3.6 3.6 3.4* 3.6  CL 98 103 106 106 109  CO2 12* 15* 19 21 28   GLUCOSE 556* 407* 281* 158* 65*  BUN 24* 24* 22 19 16   CREATININE 1.09 1.09 1.02 0.93 0.86  CALCIUM 8.4 8.4 8.3* 8.3* 8.3*   Liver Function Tests:  Recent Labs Lab 04/10/13 1030  AST 17  ALT 13  ALKPHOS 94  BILITOT 0.5  PROT 7.9  ALBUMIN 3.4*    Recent Labs Lab 04/10/13 1030  LIPASE 16   No results found for this basename: AMMONIA,  in the last 168 hours CBC:  Recent Labs Lab 04/10/13 1030 04/11/13 1233 04/11/13 1651  WBC 7.9 8.6 10.3  NEUTROABS 4.0 6.3  --   HGB 12.2* 11.2* 10.5*  HCT 35.7* 34.2* 32.3*  MCV 80.6 82.0 83.7  PLT 398 371 340  Cardiac Enzymes: No results found for this basename: CKTOTAL, CKMB, CKMBINDEX, TROPONINI,  in the last 168 hours BNP (last 3 results) No results found for this basename: PROBNP,  in the last 8760 hours CBG:  Recent Labs Lab 04/11/13 2332 04/12/13 0111 04/12/13 0210 04/12/13 0732 04/12/13 1154  GLUCAP 150* 100* 80 92 150*    No results found for this or any previous visit (from the past 240 hour(s)).   Studies:  Recent x-ray studies have been reviewed in detail by the Attending Physician  Scheduled  Meds:  Scheduled Meds: . enoxaparin (LOVENOX) injection  40 mg Subcutaneous Q24H  . feeding supplement  237 mL Oral TID BM  . insulin aspart  0-5 Units Subcutaneous QHS  . insulin aspart  0-9 Units Subcutaneous TID WC  . insulin glargine  20 Units Subcutaneous QHS  . OxyCODONE  20 mg Oral Q12H  . pantoprazole (PROTONIX) IV  40 mg Intravenous Q12H  . pregabalin  100 mg Oral TID   Continuous Infusions: . sodium chloride 125 mL/hr at 04/11/13 1828  . dextrose 5 % and 0.45% NaCl 75 mL/hr at 04/11/13 2300    Time spent on care of this patient: 30  min   Calvert Cantor, MD  [facility] Office  [phone] Pager - Text Page per Loretha Stapler as per below:  On-Call/Text Page:      [email or web address]      password [email or web address]  If 7PM-7AM, please contact night-coverage [email or web address] Password [email or web address] 04/12/2013, 3:21 PM   LOS: 1 day

## 2013-04-12 NOTE — Progress Notes (Addendum)
Pt has difficulty obtaining medications and is requesting to speak to the case management about a long term plan before being discharged.  Aunt is also involved with his care and is requesting to be made aware of a set up plan.  Name is Jason [name] [phone]

## 2013-04-12 NOTE — Progress Notes (Signed)
Pt transferred to 5W34 per MD order. Report called to receiving nurse and all questions answered.

## 2013-04-12 NOTE — Progress Notes (Signed)
Pt anion gap closed and 4 consecutive BS within goal M. Lynch notified new orders received will continue to monitor

## 2013-04-13 LAB — GLUCOSE, CAPILLARY
Glucose-Capillary: 64 mg/dL — ABNORMAL LOW (ref 70–99)
Glucose-Capillary: 84 mg/dL (ref 70–99)
Glucose-Capillary: 141 mg/dL — ABNORMAL HIGH (ref 70–99)
Glucose-Capillary: 130 mg/dL — ABNORMAL HIGH (ref 70–99)

## 2013-04-13 MED ORDER — PROMETHAZINE HCL 25 MG/ML IJ SOLN
12.5000 mg | Freq: Four times a day (QID) | INTRAMUSCULAR | Status: DC | PRN
Start: 2013-04-13 — End: 2013-04-14
  Filled 2013-04-13: qty 1

## 2013-04-13 MED ORDER — ONDANSETRON HCL 4 MG PO TABS: 4.0000 mg | ORAL_TABLET | ORAL | Status: DC | PRN

## 2013-04-13 MED ORDER — ONDANSETRON HCL 4 MG PO TABS: 4.0000 mg | ORAL_TABLET | Freq: Three times a day (TID) | ORAL | Status: DC | PRN

## 2013-04-13 MED ORDER — PANTOPRAZOLE SODIUM 40 MG PO TBEC
40.0000 mg | DELAYED_RELEASE_TABLET | Freq: Two times a day (BID) | ORAL | Status: DC
  Administered 2013-04-13 – 2013-04-14 (×3): 40 mg via ORAL
  Filled 2013-04-13 (×3): qty 1

## 2013-04-13 MED ORDER — INSULIN GLARGINE 100 UNIT/ML ~~LOC~~ SOLN
10.0000 [IU] | Freq: Every day | SUBCUTANEOUS | Status: DC
  Administered 2013-04-13: 10 [IU] via SUBCUTANEOUS
  Filled 2013-04-13 (×2): qty 0.1

## 2013-04-13 MED ORDER — OXYCODONE HCL 5 MG PO TABS
5.0000 mg | ORAL_TABLET | ORAL | Status: DC | PRN
  Administered 2013-04-14 (×2): 10 mg via ORAL
  Filled 2013-04-13 (×4): qty 2

## 2013-04-13 NOTE — Progress Notes (Signed)
[facility] Progress Note [place] TEAM 1 - Stepdown/ICU TEAM   Jason [name] ZOX:[id number] DOB: [birth year] DOA: 04/11/2013 PCP: Nelwyn Salisbury, MD  Brief narrative: 27 year old male with insulin-dependent diabetes and gastroparesis/severe GERD who presented to ED with intractable nausea/vomiting, diffuse abdominal pain, and generalized weakness. Per patient his symptoms started several days prior to his admit.  He was seen at [facility] the day before his admit but improved with supportive treatment. However the symptoms returned. EMS was called and blood sugar was found to be greater than 600. Patient was found to be in DKA with bicarbonate of 16, UA positive for ketones, blood glucose of 605, anion gap of 29. Hospitalist service was requested for admission.   Assessment/Plan:  Severely uncontrolled DM1 - labile - with initial DKA and now severe hypoglycemia -DKA resolved, but rapidly transitioened to severe hypoglycemia with minimal insulin dosing -monitor in hospital an additional 24hrs to assure CBG has stabilized prior to d/c  -as per Case Management notes, procurement of insulin and outpt f/u appointments are proving to be a challenge  GERD/gastroparesis >> N/V -due to severely uncontrolled DM -sx controlled at this time  -has f/u with Dr Rhea Belton later this month for EGD  Dehydration -due to DKA  -resolved   Code Status: FULL Family Communication: spoke directly w/ pt  Disposition Plan: possible home in AM if CBG remains stable   Consultants: none  Procedures: none  Antibiotics: none  DVT prophylaxis: Lovenox  HPI/Subjective: C/O low back pain and burning pain in legs/feed c/w his chronic neuropathic pain.  Denies current N/V, sob, or chest pain.  Objective: Blood pressure 146/96, pulse 88, temperature 97.2 F (36.2 C), temperature source Oral, resp. rate 16, height 5\' 8"  (1.727 m), weight 58.9 kg (129 lb 13.6 oz), SpO2  98.00%.  Intake/Output Summary (Last 24 hours) at 04/13/13 1638 Last data filed at 04/13/13 0240  Gross per 24 hour  Intake    240 ml  Output      0 ml  Net    240 ml    Exam: General: No acute respiratory distress Lungs: Clear to auscultation bilaterally without wheezes or crackles Cardiovascular: Regular rate and rhythm without murmur gallop or rub normal S1 and S2 Abdomen: Nontender, nondistended, soft, bowel sounds positive, no rebound, no ascites, no appreciable mass Extremities: No significant cyanosis, clubbing, or edema bilateral lower extremities  Data Reviewed: Basic Metabolic Panel:  Recent Labs Lab 04/11/13 1651 04/11/13 1824 04/11/13 2022 04/12/13 04/12/13 0422  NA 140 141 141 139 140  K 4.5 3.6 3.6 3.4* 3.6  CL 98 103 106 106 109  CO2 12* 15* 19 21 28   GLUCOSE 556* 407* 281* 158* 65*  BUN 24* 24* 22 19 16   CREATININE 1.09 1.09 1.02 0.93 0.86  CALCIUM 8.4 8.4 8.3* 8.3* 8.3*   Liver Function Tests:  Recent Labs Lab 04/10/13 1030  AST 17  ALT 13  ALKPHOS 94  BILITOT 0.5  PROT 7.9  ALBUMIN 3.4*    Recent Labs Lab 04/10/13 1030  LIPASE 16   CBC:  Recent Labs Lab 04/10/13 1030 04/11/13 1233 04/11/13 1651  WBC 7.9 8.6 10.3  NEUTROABS 4.0 6.3  --   HGB 12.2* 11.2* 10.5*  HCT 35.7* 34.2* 32.3*  MCV 80.6 82.0 83.7  PLT 398 371 340   CBG:  Recent Labs Lab 04/12/13 1705 04/12/13 2215 04/13/13 1116 04/13/13 1136 04/13/13 1157  GLUCAP 129* 142* 36* 64* 84    Studies:  Recent x-ray studies have been reviewed in detail by the Attending Physician  Scheduled Meds:  Scheduled Meds: . enoxaparin (LOVENOX) injection  40 mg Subcutaneous Q24H  . feeding supplement  237 mL Oral TID BM  . insulin aspart  0-5 Units Subcutaneous QHS  . insulin aspart  0-9 Units Subcutaneous TID WC  . insulin glargine  20 Units Subcutaneous QHS  . OxyCODONE  20 mg Oral Q12H  . pantoprazole  40 mg Oral BID  . pregabalin  100 mg Oral TID    Time spent on  care of this patient:   Lonia Blood, MD  [facility] Office  [phone] Pager - Text Page per Loretha Stapler as per below:  On-Call/Text Page:      [email or web address]      password [email or web address]  If 7PM-7AM, please contact night-coverage [email or web address] Password [email or web address] 04/13/2013, 4:38 PM   LOS: 2 days

## 2013-04-13 NOTE — Progress Notes (Signed)
Inpatient Diabetes Program Recommendations  AACE/ADA: New Consensus Statement on Inpatient Glycemic Control (2013)  Target Ranges:  Prepandial:   less than 140 mg/dL      Peak postprandial:   less than 180 mg/dL (1-2 hours)      Critically ill patients:  140 - 180 mg/dL   Results for IDRIS, EDMUNDSON (MRN [id number]) as of 04/13/2013 15:27  Ref. Range 04/12/2013 07:32 04/12/2013 11:54 04/12/2013 17:05 04/12/2013 22:15 04/13/2013 11:16 04/13/2013 11:36 04/13/2013 11:57  Glucose-Capillary Latest Range: 70-99 mg/dL 92 086 (H) 578 (H) 469 (H) 36 (LL) 64 (L) 84    Inpatient Diabetes Program Recommendations Insulin - Basal: Please consider decreasing Lantus to 18 units QHS.  Note: Noted that blood glucose was low at 36 mg/dl at 62:95.  Please consider decreasing Lantus to 18 units QHS.  Will continue to follow.  Thanks, Orlando Penner, RN, MSN, CCRN Diabetes Coordinator Inpatient Diabetes Program [phone]

## 2013-04-13 NOTE — Progress Notes (Signed)
Noted in patient's chart that he has no insurance and has Lantus and Novolog as his home medications.  In talking with the patient, he reports that he has exhausted his resources (family and friends) in helping him purchase his insulins.  Patient reports that he has tried several other insulins in the past and they do not keep his blood sugar controlled as well as Lantus and Novolog.  He prefers to stay on Lantus and Novolog at this time.  Patient reports that he has already applied for Medicaid and is awaiting to hear from them.  Patient requested assistance with getting Lantus and Novolog medication vouchers.  Also informed the patient about [facility] as a resource he may want to consider if he needs to see a doctor before he gets approved for Medicaid.  Consulted Case Management for medication assistance.    Thanks, Orlando Penner, RN, MSN, CCRN Diabetes Coordinator Inpatient Diabetes Program [phone]

## 2013-04-13 NOTE — Progress Notes (Signed)
Hypoglycemic Event  CBG: 36,64  Treatment: 15 GM carbohydrate snack  Symptoms: Shaky  Follow-up CBG: Time:1155 CBG Result:84  Possible Reasons for Event: Unknown  Comments/MD notified:mcclung    Jason [name]  Remember to initiate Hypoglycemia Order Set & complete

## 2013-04-13 NOTE — Progress Notes (Signed)
   CARE MANAGEMENT NOTE 04/13/2013  Patient:  Jason [name], Jason [name]   Account Number:  [id number]  Date Initiated:  04/13/2013  Documentation initiated by:  [facility]  Subjective/Objective Assessment:   DKA     Action/Plan:   lives with girlfriend, Jason [name]   Anticipated DC Date:  04/13/2013   Anticipated DC Plan:  HOME/SELF CARE      DC Planning Services  CM consult  Medication Assistance  PCP issues      Choice offered to / List presented to:             Status of service:  Completed, signed off Medicare Important Message given?   (If response is "NO", the following Medicare IM given date fields will be blank) Date Medicare IM given:   Date Additional Medicare IM given:    Discharge Disposition:  HOME/SELF CARE  Per UR Regulation:    If discussed at Long Length of Stay Meetings, dates discussed:    Comments:  04/13/2013 1540 NCM spoke to pt and states he was in the eligibility process at [facility]. Contacted clinic to verify what is needed to complete process and they do not have record of his application. Arranged an appt September 2 at 9:15 am. States they are not currently accepting new pt and that was the earliest appt. Placed pt name on list for Sept 2. [facility] and pt has to be a residence of [place] for this clinic. Attempted to arrange appt with [facility]. Left message with scheduler for clinic and they will call to arrange appt for post hospital dc follow up. Pt requesting assistance with medication. Pt is not eligible for MATCH. He used the program in April 2014. Provided pt with PAP for Lantus. NCM explained to pt to keep his eligibility appt with [facility] for long-term care and lower cost and to follow up with [facility] post dc temp until established with [facility] in [place]. Isidoro Donning RN CCM Case Mgmt phone [phone]

## 2013-04-13 NOTE — Progress Notes (Signed)
Utilization review completed. Elona Yinger, RN, BSN. 

## 2013-04-13 NOTE — Progress Notes (Addendum)
INITIAL NUTRITION ASSESSMENT  DOCUMENTATION CODES Per approved criteria  -Not Applicable   INTERVENTION:  Continue Glucerna Shake 3 times daily (220 kcals, 9.9 gm protein per 8 fl oz bottle) RD to follow for nutrition care plan  NUTRITION DIAGNOSIS: Unintended weight loss related to uncontrolled DM as evidenced by 8% weight loss  Goal: Oral intake with meals & supplements to meet >/= 90% of estimated nutrition needs  Monitor:  PO & supplemental intake, weight, labs, I/O's  Reason for Assessment: Malnutrition Screening Tool Report  27 y.o. male  Admitting Dx: DKA (diabetic ketoacidoses)  ASSESSMENT: Patient with insulin-dependent diabetes, gastroparesis, severe GERD presented to ED with intractable nausea, vomiting, diffuse abdominal pain and generalized weakness; blood sugar was found to be greater than 600.  Patient known to this RD from previous hospitalization; patient reports his appetite is ok; PO intake 25-50% per flowsheet records; per weight records, he's had an 8% weight loss since end of June 2014; has Glucerna Shake supplements ordered.  Height: Ht Readings from Last 1 Encounters:  04/12/13 5\' 8"  (1.727 m)    Weight: Wt Readings from Last 1 Encounters:  04/12/13 129 lb 13.6 oz (58.9 kg)    Ideal Body Weight: 154 lb  % Ideal Body Weight: 84%  Wt Readings from Last 10 Encounters:  04/12/13 129 lb 13.6 oz (58.9 kg)  04/10/13 129 lb (58.514 kg)  04/01/13 129 lb (58.514 kg)  03/31/13 129 lb 2 oz (58.571 kg)  03/28/13 140 lb (63.504 kg)  03/20/13 131 lb 9.6 oz (59.693 kg)  01/28/13 128 lb 4.8 oz (58.196 kg)  01/12/13 157 lb (71.215 kg)  12/09/12 130 lb (58.968 kg)  10/02/12 126 lb (57.153 kg)    Usual Body Weight: 140 lb  % Usual Body Weight:   BMI:  Body mass index is 19.75 kg/(m^2).  Estimated Nutritional Needs: Kcal: 1750-1950 Protein: 85-95 gm Fluid: 1.7-1.9 L  Skin: Intact  Diet Order: Carb Control  EDUCATION NEEDS: -No education  needs identified at this time   Intake/Output Summary (Last 24 hours) at 04/13/13 1413 Last data filed at 04/13/13 0240  Gross per 24 hour  Intake    240 ml  Output      0 ml  Net    240 ml    Labs:   Recent Labs Lab 04/11/13 2022 04/12/13 04/12/13 0422  NA 141 139 140  K 3.6 3.4* 3.6  CL 106 106 109  CO2 19 21 28   BUN 22 19 16   CREATININE 1.02 0.93 0.86  CALCIUM 8.3* 8.3* 8.3*  GLUCOSE 281* 158* 65*    CBG (last 3)   Recent Labs  04/13/13 1116 04/13/13 1136 04/13/13 1157  GLUCAP 36* 64* 84    Scheduled Meds: . enoxaparin (LOVENOX) injection  40 mg Subcutaneous Q24H  . feeding supplement  237 mL Oral TID BM  . insulin aspart  0-5 Units Subcutaneous QHS  . insulin aspart  0-9 Units Subcutaneous TID WC  . insulin glargine  20 Units Subcutaneous QHS  . OxyCODONE  20 mg Oral Q12H  . pantoprazole  40 mg Oral BID  . pregabalin  100 mg Oral TID    Continuous Infusions: . sodium chloride 50 mL/hr at 04/13/13 1031    Past Medical History  Diagnosis Date  . Diabetes mellitus   . GERD (gastroesophageal reflux disease)   . Asthma   . Hx MRSA infection     on face  . Gastroparesis   . Diabetic neuropathy   .  Seizures     Past Surgical History  Procedure Laterality Date  . Tonsillectomy      Maureen Chatters, RD, LDN Pager #: [phone] After-Hours Pager #: [phone]

## 2013-04-14 DIAGNOSIS — K219 Gastro-esophageal reflux disease without esophagitis: Secondary | ICD-10-CM

## 2013-04-14 LAB — BASIC METABOLIC PANEL
Chloride: 104 mEq/L (ref 96–112)
Creatinine, Ser: 0.72 mg/dL (ref 0.50–1.35)
GFR calc Af Amer: >90 mL/min (ref >90)
Potassium: 4 mEq/L (ref 3.5–5.1)

## 2013-04-14 MED ORDER — OXYCODONE HCL ER 15 MG PO T12A: 15.0000 mg | EXTENDED_RELEASE_TABLET | Freq: Two times a day (BID) | ORAL | Status: DC

## 2013-04-14 MED ORDER — PREGABALIN 100 MG PO CAPS: 100.0000 mg | ORAL_CAPSULE | Freq: Three times a day (TID) | ORAL | Status: DC

## 2013-04-14 NOTE — Progress Notes (Signed)
Pt was given a [place] sandwich for a snack

## 2013-04-14 NOTE — Progress Notes (Signed)
Inpatient Diabetes Program Recommendations  AACE/ADA: New Consensus Statement on Inpatient Glycemic Control (2013)  Target Ranges:  Prepandial:   less than 140 mg/dL      Peak postprandial:   less than 180 mg/dL (1-2 hours)      Critically ill patients:  140 - 180 mg/dL     Results for ULIS, KAPS (MRN [id number]) as of 04/14/2013 14:12  Ref. Range 04/14/2013 07:39 04/14/2013 12:17  Glucose-Capillary Latest Range: 70-99 mg/dL 272 (H) 536 (H)    CBGs improved.  Patient to d/c home today per orders.  No changes made to patient's home insulin regimen.  Noted care management has been working with this patient and has given patient several community resources for obtaining insulin at lower cost.    Went in to speak with this patient.  Patient told me he has 2 vials of Lantus at home and at least 4 Novolog flexpens at home as well.  Patient told me this will likely be enough insulin to last him until his Medicaid application gets approved.  Once patient attains Medicaid, patient will be able to get all his insulin Rxs for $3.  Patient told me he is currently seeing Gershon Crane, MD but once he gets his Medicaid he will start seeing Dr. Talmage Nap here in [facility] with [facility] for his endocrinology care.  Will continue to follow while inpatient. Ambrose Finland RN, MSN, CDE Diabetes Coordinator Inpatient Diabetes Program [phone]

## 2013-04-14 NOTE — Care Management Note (Signed)
    Page 1 of 2   04/14/2013     2:21:55 PM   CARE MANAGEMENT NOTE 04/14/2013  Patient:  Jason [name], Jason [name]   Account Number:  [id number]  Date Initiated:  04/13/2013  Documentation initiated by:  [name] : [facility]  Subjective/Objective Assessment:   DKA     Action/Plan:   lives with girlfriend, Jason [name]   Anticipated DC Date:  04/07/2013   Anticipated DC Plan:  HOME/SELF CARE      DC Planning Services  CM consult  Medication Assistance  PCP issues      Choice offered to / List presented to:             Status of service:  Completed, signed off Medicare Important Message given?   (If response is "NO", the following Medicare IM given date fields will be blank) Date Medicare IM given:   Date Additional Medicare IM given:    Discharge Disposition:  HOME/SELF CARE  Per UR Regulation:  Reviewed for med. necessity/level of care/duration of stay  If discussed at Long Length of Stay Meetings, dates discussed:    Comments:  04/14/13 14:20 Jason Cape RN, BSN [phone] patient is for dc today.  Not eligible for Match Program since already used it per NCM note.  04/13/2013 1540 NCM spoke to pt and states he was in the eligibility process at [facility]. Contacted clinic to verify what is needed to complete process and they do not have record of his application. Arranged an appt September 2 at 9:15 am. States they are not currently accepting new pt and that was the earliest appt. Placed pt name on list for Sept 2. [facility] and pt has to be a residence of [employer] for this clinic. Attempted to arrange appt with [name] [place]. Left message with scheduler for clinic and they will call to arrange appt for post hospital dc follow up. Pt requesting assistance with medication. Pt is not eligible for MATCH. He used the program in April 2014. Provided pt with PAP for Lantus. NCM explained to pt to keep his eligibility appt with [name] for long-term care and lower cost and to follow up with [facility] post dc temp until established with Jason [name] in [place]. Isidoro Donning RN CCM Case Mgmt phone [phone]

## 2013-04-14 NOTE — Discharge Summary (Signed)
Physician Discharge Summary  Jason [name] ZOX:[id number] DOB: [birth year] DOA: 04/11/2013  PCP: Nelwyn Salisbury, MD  Admit date: 04/11/2013 Discharge date: 04/14/2013  Time spent: 45 minutes   Discharge Diagnoses:  Principal Problem:   DKA (diabetic ketoacidoses) Active Problems:   GERD   Nausea and vomiting   Noncompliance with diet and medication regimen   Dehydration   Discharge Condition: stable  Diet recommendation: diabetic/ small meals  Filed Weights   04/11/13 1622 04/12/13 1539  Weight: 40.2 kg (88 lb 10 oz) 58.9 kg (129 lb 13.6 oz)    History of present illness:  27 year old male with insulin-dependent diabetes and gastroparesis/severe GERD who presented to ED with intractable nausea/vomiting, diffuse abdominal pain, and generalized weakness. Per patient his symptoms started several days prior to his admit. He was seen at [facility] the day before his admit but improved with supportive treatment. However the symptoms returned. EMS was called and blood sugar was found to be greater than 600. Patient was found to be in DKA with bicarbonate of 16, UA positive for ketones, blood glucose of 605, anion gap of 29. Hospitalist service was requested for admission.    Hospital Course:  Severely uncontrolled DM1 - labile - with initial DKA and now severe hypoglycemia  -DKA resolved, but rapidly transitioened to severe hypoglycemia with minimal insulin dosing  -Pt states he usually eats much more at home and therefore sugars usually do not drop  GERD/gastroparesis >> N/V  -due to severely uncontrolled DM  -sx controlled at this time  -has f/u with Dr Rhea Belton later this month for EGD   Dehydration  -due to DKA  -resolved   Discharge Exam: Filed Vitals:   04/13/13 0516 04/13/13 1319 04/13/13 2144 04/14/13 0513  BP: 105/70 146/96 127/84 132/85  Pulse: 88 88 94 73  Temp: 97.4 F (36.3 C) 97.2 F (36.2 C) 98.3 F (36.8 C) 98.5 F (36.9 C)  TempSrc: Oral Oral  Oral Oral  Resp: 18 16 18 18   Height:      Weight:      SpO2: 94% 98% 97% 97%    General: AAO x 3, no distress Cardiovascular: RRR, no murmurs Respiratory: CTA b/l   Discharge Instructions  Discharge Orders   Future Appointments Provider Department Dept Phone   04/27/2013 10:00 AM Beverley Fiedler, MD [facility] [phone]   Future Orders Complete By Expires     Diet - low sodium heart healthy  As directed     Comments:      Diabetic diet    Increase activity slowly  As directed         Medication List    STOP taking these medications       ciprofloxacin 500 MG tablet  Commonly known as:  CIPRO     ibuprofen 200 MG tablet  Commonly known as:  ADVIL,MOTRIN     oxyCODONE-acetaminophen 10-325 MG per tablet  Commonly known as:  PERCOCET      TAKE these medications       insulin aspart 100 UNIT/ML injection  Commonly known as:  NOVOLOG FLEXPEN  Inject 1-17 Units into the skin 4 (four) times daily -  before meals and at bedtime.     insulin glargine 100 UNIT/ML injection  Commonly known as:  LANTUS  Inject 0.32 mLs (32 Units total) into the skin at bedtime.     metoCLOPramide 10 MG tablet  Commonly known as:  REGLAN  Take 1 tablet (10  mg total) by mouth 3 (three) times daily.     OxyCODONE 15 mg T12a  Commonly known as:  OXYCONTIN  Take 1 tablet (15 mg total) by mouth every 12 (twelve) hours.     pregabalin 100 MG capsule  Commonly known as:  LYRICA  Take 1 capsule (100 mg total) by mouth 3 (three) times daily.     promethazine 25 MG suppository  Commonly known as:  PHENERGAN  Place 1 suppository (25 mg total) rectally every 6 (six) hours as needed for nausea.     ZEGERID OTC 20-1100 MG Caps  Generic drug:  Omeprazole-Sodium Bicarbonate  Take 1 capsule by mouth at bedtime.     zolpidem 10 MG tablet  Commonly known as:  AMBIEN  Take 10 mg by mouth at bedtime as needed for sleep.       Allergies  Allergen Reactions  .  Cefuroxime Axetil Anaphylaxis  . Penicillins Anaphylaxis  . Tessalon (Benzonatate) Anaphylaxis    Can take amoxicillin  . Shellfish Allergy Itching       Follow-up Information   Follow up with [facility] On 06/09/2013. (Appointment at 9:00 am on June 09, 2013.  Please call to reschedule if you are unable to make your appointment. )    Contact information:   [address] [place] [address] [phone]      Follow up with [facility]    . (Please call to arrange an appointment for follow up after discharge within 1-2 weeks.  )    Contact information:   [address] [place] [address] [phone]        The results of significant diagnostics from this hospitalization (including imaging, microbiology, ancillary and laboratory) are listed below for reference.    Significant Diagnostic Studies: Ct Abdomen Pelvis Wo Contrast  04/11/2013   *RADIOLOGY REPORT*  Clinical Data: Abdominal tenderness.  Nausea and vomiting. Weakness.  Back pain.  CT ABDOMEN AND PELVIS WITHOUT CONTRAST  Technique:  Multidetector CT imaging of the abdomen and pelvis was performed following the standard protocol without intravenous contrast.  Comparison: Report from ultrasound dated 02/23/2001.  Findings: The visualized portion of the liver, spleen, pancreas, and adrenal glands appear unremarkable in noncontrast CT appearance.  Small contour the gallbladder suggests the contraction.  2 mm right kidney lower pole nonobstructive calculus noted along with a 102 mm right mid kidney nonobstructive calculus.  Several punctate left renal calcifications are present including a 2 mm of left mid kidney calculus.  Diffuse low-level mesenteric stranding noted with trace free pelvic fluid.  Bilateral pelvic phleboliths are present without ureteral or bladder calculus.  Urinary bladder is considerably distended, extending up to the level of the iliac crests.   Given the paucity of intra-abdominal fat and lack of oral and IV contrast, separation of abdominal structures is problematic.  There is some faint stranding in the right lower quadrant adjacent to the appendix, but the appendix is gas-filled and does not demonstrate wall thickening, and the stranding is similar quality to the diffuse stranding in the mesentery.  No dilated bowel observed.  IMPRESSION:  1.  Diffuse low-level mesenteric stranding of uncertain significance.  There is a trace amount of free pelvic fluid.  No dilated bowel observed. 2.  Some of this stranding is in the vicinity of the appendix, but the appendix is gas-filled and thin-walled, and accordingly the appearance is not convincing for acute appendicitis. 3.  Bilateral  nonobstructive nephrolithiasis. 4.  Distended urinary bladder.   Original Report Authenticated By: Gaylyn Rong, M.D.   Dg Chest 2 View  04/11/2013   *RADIOLOGY REPORT*  Clinical Data: Chest pain and shortness of breath  CHEST - 2 VIEW  Comparison: March 20, 2013  Findings:  Lungs clear.  Heart size and pulmonary vascularity are normal.  No adenopathy.  No pneumothorax.  No bone lesions.  IMPRESSION: No abnormality noted.   Original Report Authenticated By: Bretta Bang, M.D.   Dg Chest 2 View  03/20/2013   *RADIOLOGY REPORT*  Clinical Data: Complaint of fever, neuropathy, and dental pain.  CHEST - 2 VIEW  Comparison: 02/15/2010.  Findings: The heart size and pulmonary vascularity are normal. The lungs appear clear and expanded without focal air space disease or consolidation. No blunting of the costophrenic angles.  No pneumothorax.  Mediastinal contours appear intact.  No significant changes since the previous study.  IMPRESSION: No evidence of active pulmonary disease.   Original Report Authenticated By: Burman Nieves, M.D.   Ct Maxillofacial W/cm  03/20/2013   *RADIOLOGY REPORT*  Clinical Data: Left upper mouth pain.  CT MAXILLOFACIAL WITH CONTRAST  Technique:   Multidetector CT imaging of the maxillofacial structures was performed with intravenous contrast. Multiplanar CT image reconstructions were also generated.  Contrast: 80mL OMNIPAQUE IOHEXOL 300 MG/ML  SOLN  Comparison: None.  Findings: Multiple dental caries are demonstrated bilaterally. Peri apical lucencies are suggested at the left upper second molar suggesting periodontal disease.  No discrete abscess is demonstrated.  No abnormal soft tissue and contrast enhancement or fluid collection.  Mucosal membrane thickening in the left maxillary antrum and bilateral ethmoid air cells consistent with chronic inflammation.  No acute air-fluid levels are demonstrated in the paranasal sinuses.  Prominent level II lymph nodes are demonstrated on the left consistent with reactive nodes. Visualized orbital and facial bones appear intact without displaced fracture identified.  Deformity of the nasal bones suggest old fracture deformity.  Visualized cervical carotid and jugular vessels are patent.  Salivary glands appear symmetrical.  No displacement of the cervical fat planes.  No prevertebral soft tissue swelling or mucosal infiltration is demonstrated.  IMPRESSION: Poor dentition with multiple dental caries.  Peri apical lucency around the left second molar.  No discrete abscess is demonstrated. Chronic inflammatory changes in the paranasal sinuses.   Original Report Authenticated By: Burman Nieves, M.D.    Microbiology: No results found for this or any previous visit (from the past 240 hour(s)).   Labs: Basic Metabolic Panel:  Recent Labs Lab 04/11/13 1824 04/11/13 2022 04/12/13 04/12/13 0422 04/14/13 0512  NA 141 141 139 140 138  K 3.6 3.6 3.4* 3.6 4.0  CL 103 106 106 109 104  CO2 15* 19 21 28 30   GLUCOSE 407* 281* 158* 65* 170*  BUN 24* 22 19 16 6   CREATININE 1.09 1.02 0.93 0.86 0.72  CALCIUM 8.4 8.3* 8.3* 8.3* 8.1*   Liver Function Tests:  Recent Labs Lab 04/10/13 1030  AST 17  ALT 13   ALKPHOS 94  BILITOT 0.5  PROT 7.9  ALBUMIN 3.4*    Recent Labs Lab 04/10/13 1030  LIPASE 16   No results found for this basename: AMMONIA,  in the last 168 hours CBC:  Recent Labs Lab 04/10/13 1030 04/11/13 1233 04/11/13 1651  WBC 7.9 8.6 10.3  NEUTROABS 4.0 6.3  --   HGB 12.2* 11.2* 10.5*  HCT 35.7* 34.2* 32.3*  MCV 80.6 82.0 83.7  PLT 398  371 340   Cardiac Enzymes: No results found for this basename: CKTOTAL, CKMB, CKMBINDEX, TROPONINI,  in the last 168 hours BNP: BNP (last 3 results) No results found for this basename: PROBNP,  in the last 8760 hours CBG:  Recent Labs Lab 04/13/13 1157 04/13/13 1658 04/13/13 2147 04/14/13 0739 04/14/13 1217  GLUCAP 84 141* 130* 135* 119*       Signed:  Amilio Zehnder  [facility] 04/14/2013, 4:06 PM

## 2013-04-18 ENCOUNTER — Observation Stay (HOSPITAL_COMMUNITY)
Admission: EM | Admit: 2013-04-18 | Discharge: 2013-04-19 | Disposition: A | Discharge status: Home / Self Care | Payer: Medicaid Other | Attending: Family Medicine | Admitting: Family Medicine

## 2013-04-18 ENCOUNTER — Emergency Department (HOSPITAL_COMMUNITY): Payer: Medicaid Other

## 2013-04-18 ENCOUNTER — Encounter (HOSPITAL_COMMUNITY): Payer: Self-pay | Admitting: Emergency Medicine

## 2013-04-18 DIAGNOSIS — M545 Low back pain, unspecified: Secondary | ICD-10-CM | POA: Diagnosis present

## 2013-04-18 DIAGNOSIS — Z720 Tobacco use: Secondary | ICD-10-CM | POA: Diagnosis present

## 2013-04-18 DIAGNOSIS — E1049 Type 1 diabetes mellitus with other diabetic neurological complication: Secondary | ICD-10-CM | POA: Insufficient documentation

## 2013-04-18 DIAGNOSIS — IMO0002 Reserved for concepts with insufficient information to code with codable children: Secondary | ICD-10-CM | POA: Diagnosis present

## 2013-04-18 DIAGNOSIS — R55 Syncope and collapse: Principal | ICD-10-CM | POA: Diagnosis present

## 2013-04-18 DIAGNOSIS — E1065 Type 1 diabetes mellitus with hyperglycemia: Secondary | ICD-10-CM | POA: Insufficient documentation

## 2013-04-18 DIAGNOSIS — R197 Diarrhea, unspecified: Secondary | ICD-10-CM | POA: Diagnosis present

## 2013-04-18 DIAGNOSIS — N179 Acute kidney failure, unspecified: Secondary | ICD-10-CM | POA: Diagnosis present

## 2013-04-18 DIAGNOSIS — E860 Dehydration: Secondary | ICD-10-CM | POA: Diagnosis present

## 2013-04-18 DIAGNOSIS — E1142 Type 2 diabetes mellitus with diabetic polyneuropathy: Secondary | ICD-10-CM

## 2013-04-18 DIAGNOSIS — K219 Gastro-esophageal reflux disease without esophagitis: Secondary | ICD-10-CM | POA: Diagnosis present

## 2013-04-18 DIAGNOSIS — E1040 Type 1 diabetes mellitus with diabetic neuropathy, unspecified: Secondary | ICD-10-CM

## 2013-04-18 DIAGNOSIS — Z79899 Other long term (current) drug therapy: Secondary | ICD-10-CM | POA: Insufficient documentation

## 2013-04-18 DIAGNOSIS — J45909 Unspecified asthma, uncomplicated: Secondary | ICD-10-CM | POA: Diagnosis present

## 2013-04-18 DIAGNOSIS — G8929 Other chronic pain: Secondary | ICD-10-CM | POA: Insufficient documentation

## 2013-04-18 DIAGNOSIS — Z794 Long term (current) use of insulin: Secondary | ICD-10-CM | POA: Insufficient documentation

## 2013-04-18 DIAGNOSIS — F17200 Nicotine dependence, unspecified, uncomplicated: Secondary | ICD-10-CM | POA: Insufficient documentation

## 2013-04-18 LAB — COMPREHENSIVE METABOLIC PANEL
ALT: 16 U/L (ref 0–53)
Alkaline Phosphatase: 105 U/L (ref 39–117)
CO2: 31 mEq/L (ref 19–32)
Calcium: 10.5 mg/dL (ref 8.4–10.5)
GFR calc Af Amer: 63 mL/min — ABNORMAL LOW (ref >90)
GFR calc non Af Amer: 54 mL/min — ABNORMAL LOW (ref >90)
Glucose, Bld: 188 mg/dL — ABNORMAL HIGH (ref 70–99)
Potassium: 4.5 mEq/L (ref 3.5–5.1)
Sodium: 133 mEq/L — ABNORMAL LOW (ref 135–145)

## 2013-04-18 LAB — URINALYSIS, ROUTINE W REFLEX MICROSCOPIC
Bilirubin Urine: NEGATIVE
Ketones, ur: NEGATIVE mg/dL
Protein, ur: 100 mg/dL — AB
Urobilinogen, UA: 0.2 mg/dL (ref 0.0–1.0)

## 2013-04-18 LAB — POCT I-STAT, CHEM 8
BUN: 13 mg/dL (ref 6–23)
Calcium, Ion: 1.14 mmol/L (ref 1.12–1.23)
Chloride: 94 mEq/L — ABNORMAL LOW (ref 96–112)
Creatinine, Ser: 1.8 mg/dL — ABNORMAL HIGH (ref 0.50–1.35)
Creatinine, Ser: 1.5 mg/dL — ABNORMAL HIGH (ref 0.50–1.35)
Glucose, Bld: 386 mg/dL — ABNORMAL HIGH (ref 70–99)
Hemoglobin: 13.9 g/dL (ref 13.0–17.0)
Hemoglobin: 11.2 g/dL — ABNORMAL LOW (ref 13.0–17.0)
Potassium: 4.9 mEq/L (ref 3.5–5.1)
Sodium: 136 mEq/L (ref 135–145)
Sodium: 134 mEq/L — ABNORMAL LOW (ref 135–145)
TCO2: 27 mmol/L (ref 0–100)

## 2013-04-18 LAB — CBC WITH DIFFERENTIAL/PLATELET
Eosinophils Relative: 3 % (ref 0–5)
Hemoglobin: 12.4 g/dL — ABNORMAL LOW (ref 13.0–17.0)
Lymphocytes Relative: 32 % (ref 12–46)
Lymphs Abs: 2.9 10*3/uL (ref 0.7–4.0)
MCV: 78.8 fL (ref 78.0–100.0)
Monocytes Relative: 13 % — ABNORMAL HIGH (ref 3–12)
Platelets: 446 10*3/uL — ABNORMAL HIGH (ref 150–400)
RBC: 4.58 MIL/uL (ref 4.22–5.81)
WBC: 9.1 10*3/uL (ref 4.0–10.5)

## 2013-04-18 LAB — RAPID URINE DRUG SCREEN, HOSP PERFORMED
Amphetamines: NOT DETECTED
Benzodiazepines: NOT DETECTED
Cocaine: NOT DETECTED
Opiates: NOT DETECTED

## 2013-04-18 LAB — GLUCOSE, CAPILLARY: Glucose-Capillary: 358 mg/dL — ABNORMAL HIGH (ref 70–99)

## 2013-04-18 LAB — POCT I-STAT 3, VENOUS BLOOD GAS (G3P V)
TCO2: 38 mmol/L (ref 0–100)
pCO2, Ven: 63.3 mmHg — ABNORMAL HIGH (ref 45.0–50.0)
pH, Ven: 7.358 — ABNORMAL HIGH (ref 7.250–7.300)

## 2013-04-18 LAB — URINE MICROSCOPIC-ADD ON

## 2013-04-18 MED ORDER — SODIUM CHLORIDE 0.9 % IV SOLN: INTRAVENOUS | Status: DC

## 2013-04-18 MED ORDER — OXYCODONE HCL ER 15 MG PO T12A
15.0000 mg | EXTENDED_RELEASE_TABLET | Freq: Two times a day (BID) | ORAL | Status: DC
  Administered 2013-04-19 (×2): 15 mg via ORAL
  Filled 2013-04-18 (×2): qty 1

## 2013-04-18 MED ORDER — INSULIN ASPART 100 UNIT/ML ~~LOC~~ SOLN
0.0000 [IU] | Freq: Three times a day (TID) | SUBCUTANEOUS | Status: DC
  Administered 2013-04-19: 3 [IU] via SUBCUTANEOUS

## 2013-04-18 MED ORDER — ZOLPIDEM TARTRATE 5 MG PO TABS: 10.0000 mg | ORAL_TABLET | Freq: Every evening | ORAL | Status: DC | PRN

## 2013-04-18 MED ORDER — ALBUTEROL SULFATE (5 MG/ML) 0.5% IN NEBU: 2.5000 mg | INHALATION_SOLUTION | Freq: Four times a day (QID) | RESPIRATORY_TRACT | Status: DC | PRN

## 2013-04-18 MED ORDER — OXYCODONE-ACETAMINOPHEN 10-325 MG PO TABS: 1.0000 | ORAL_TABLET | ORAL | Status: DC | PRN

## 2013-04-18 MED ORDER — SODIUM CHLORIDE 0.9 % IJ SOLN: 3.0000 mL | Freq: Two times a day (BID) | INTRAMUSCULAR | Status: DC

## 2013-04-18 MED ORDER — PANTOPRAZOLE SODIUM 40 MG PO TBEC
40.0000 mg | DELAYED_RELEASE_TABLET | Freq: Every day | ORAL | Status: DC
  Administered 2013-04-19: 40 mg via ORAL
  Filled 2013-04-18: qty 1

## 2013-04-18 MED ORDER — INSULIN GLARGINE 100 UNIT/ML ~~LOC~~ SOLN
32.0000 [IU] | Freq: Every day | SUBCUTANEOUS | Status: DC
  Administered 2013-04-19: 32 [IU] via SUBCUTANEOUS
  Filled 2013-04-18 (×2): qty 0.32

## 2013-04-18 MED ORDER — INSULIN ASPART 100 UNIT/ML ~~LOC~~ SOLN
0.0000 [IU] | Freq: Every day | SUBCUTANEOUS | Status: DC
  Administered 2013-04-19: 4 [IU] via SUBCUTANEOUS

## 2013-04-18 MED ORDER — SODIUM CHLORIDE 0.9 % IV BOLUS (SEPSIS)
2000.0000 mL | Freq: Once | INTRAVENOUS | Status: AC
  Administered 2013-04-18: 2000 mL via INTRAVENOUS

## 2013-04-18 MED ORDER — MORPHINE SULFATE 4 MG/ML IJ SOLN
6.0000 mg | Freq: Once | INTRAMUSCULAR | Status: AC
  Administered 2013-04-18: 6 mg via INTRAVENOUS
  Filled 2013-04-18: qty 2

## 2013-04-18 MED ORDER — NICOTINE 7 MG/24HR TD PT24
7.0000 mg | MEDICATED_PATCH | Freq: Every day | TRANSDERMAL | Status: DC
Start: 2013-04-19 — End: 2013-04-19
  Administered 2013-04-19: 7 mg via TRANSDERMAL
  Filled 2013-04-18: qty 1

## 2013-04-18 MED ORDER — PREGABALIN 100 MG PO CAPS
100.0000 mg | ORAL_CAPSULE | Freq: Three times a day (TID) | ORAL | Status: DC
  Administered 2013-04-19 (×2): 100 mg via ORAL
  Filled 2013-04-18 (×2): qty 2

## 2013-04-18 MED ORDER — ONDANSETRON HCL 4 MG PO TABS: 4.0000 mg | ORAL_TABLET | Freq: Four times a day (QID) | ORAL | Status: DC | PRN

## 2013-04-18 MED ORDER — CYCLOBENZAPRINE HCL 10 MG PO TABS: 5.0000 mg | ORAL_TABLET | Freq: Three times a day (TID) | ORAL | Status: DC | PRN

## 2013-04-18 MED ORDER — ACETAMINOPHEN 325 MG PO TABS: 650.0000 mg | ORAL_TABLET | Freq: Four times a day (QID) | ORAL | Status: DC | PRN

## 2013-04-18 MED ORDER — ALBUTEROL SULFATE (5 MG/ML) 0.5% IN NEBU
5.0000 mg | INHALATION_SOLUTION | Freq: Once | RESPIRATORY_TRACT | Status: AC
  Administered 2013-04-18: 5 mg via RESPIRATORY_TRACT

## 2013-04-18 MED ORDER — OXYCODONE-ACETAMINOPHEN 5-325 MG PO TABS
2.0000 | ORAL_TABLET | Freq: Once | ORAL | Status: AC
  Administered 2013-04-18: 2 via ORAL
  Filled 2013-04-18: qty 2

## 2013-04-18 MED ORDER — METOCLOPRAMIDE HCL 10 MG PO TABS
10.0000 mg | ORAL_TABLET | Freq: Three times a day (TID) | ORAL | Status: DC
  Administered 2013-04-19: 10 mg via ORAL
  Filled 2013-04-18 (×4): qty 1

## 2013-04-18 MED ORDER — ONDANSETRON HCL 4 MG/2ML IJ SOLN: 4.0000 mg | Freq: Four times a day (QID) | INTRAMUSCULAR | Status: DC | PRN

## 2013-04-18 MED ORDER — OXYCODONE HCL 5 MG PO TABS
5.0000 mg | ORAL_TABLET | ORAL | Status: DC | PRN
  Administered 2013-04-19: 5 mg via ORAL
  Filled 2013-04-18: qty 1

## 2013-04-18 MED ORDER — ENOXAPARIN SODIUM 40 MG/0.4ML ~~LOC~~ SOLN
40.0000 mg | SUBCUTANEOUS | Status: DC
  Administered 2013-04-19: 40 mg via SUBCUTANEOUS
  Filled 2013-04-18: qty 0.4

## 2013-04-18 MED ORDER — SODIUM CHLORIDE 0.9 % IV SOLN
INTRAVENOUS | Status: DC
  Administered 2013-04-19: 100 mL/h via INTRAVENOUS

## 2013-04-18 MED ORDER — DIAZEPAM 2 MG PO TABS
2.0000 mg | ORAL_TABLET | Freq: Once | ORAL | Status: AC
  Administered 2013-04-18: 2 mg via ORAL
  Filled 2013-04-18: qty 1

## 2013-04-18 MED ORDER — PROMETHAZINE HCL 25 MG RE SUPP: 25.0000 mg | Freq: Four times a day (QID) | RECTAL | Status: DC | PRN

## 2013-04-18 MED ORDER — OXYCODONE-ACETAMINOPHEN 5-325 MG PO TABS
1.0000 | ORAL_TABLET | ORAL | Status: DC | PRN
  Administered 2013-04-19: 1 via ORAL
  Filled 2013-04-18: qty 1

## 2013-04-18 MED ORDER — IPRATROPIUM BROMIDE 0.02 % IN SOLN
0.5000 mg | Freq: Once | RESPIRATORY_TRACT | Status: AC
  Administered 2013-04-18: 0.5 mg via RESPIRATORY_TRACT

## 2013-04-18 MED ORDER — ALBUTEROL SULFATE (5 MG/ML) 0.5% IN NEBU
INHALATION_SOLUTION | RESPIRATORY_TRACT | Status: AC
  Filled 2013-04-18: qty 1

## 2013-04-18 MED ORDER — ACETAMINOPHEN 650 MG RE SUPP: 650.0000 mg | Freq: Four times a day (QID) | RECTAL | Status: DC | PRN

## 2013-04-18 NOTE — ED Provider Notes (Signed)
I evaluated the patient in conjunction with the resident physician.  I saw the relevant studies, including the ecg (as needed) and agree with the interpretation. The documentation is accurate with the following additional / clarifications:  This young male with insulin-dependent diabetes now presents with multiple complaints.  Notably, the patient has evidence of a new kidney injury.  Patient does not have evidence of DKA.  Though he improved, his persistent symptoms, any kidney injury warranted inpatient evaluation.  Gerhard Munch, MD 04/18/13 [phone]

## 2013-04-18 NOTE — ED Provider Notes (Signed)
History    CSN: [id number] Arrival date & time 04/18/13  1657  First MD Initiated Contact with Patient 04/18/13 1702     Chief Complaint  Patient presents with  . Fatigue  . Diarrhea   (Consider location/radiation/quality/duration/timing/severity/associated sxs/prior Treatment) Patient is a 27 y.o. male presenting with diarrhea. The history is provided by the patient. The history is limited by the condition of the patient.  Diarrhea Associated symptoms comment:  RAHMAN FERRALL is a 27 y.o. male h/o Type I diabetes, here with syncope.  Patient states he has been presyncopal for the past 2 days.  He admits to non bloody diarrhea for 5 hours today and shortness of breath.  He denies fevers, chills or other systemic findings.  He feels very weak and believes he may of had another seizure yesterday due to hypoglycemia.  Denies any possible seizure today.  ROS is otherwise negative.  Past Medical History  Diagnosis Date  . Diabetes mellitus   . GERD (gastroesophageal reflux disease)   . Asthma   . Hx MRSA infection     on face  . Gastroparesis   . Diabetic neuropathy   . Seizures    Past Surgical History  Procedure Laterality Date  . Tonsillectomy     Family History  Problem Relation Age of Onset  . Diabetes Father   . Hypertension Father   . Asthma      fhx  . Hypertension      fhx  . Stroke      fhx  . Heart disease Mother    History  Substance Use Topics  . Smoking status: Current Every Day Smoker    Types: Cigars  . Smokeless tobacco: Never Used     Comment: 2 per day  . Alcohol Use: No    Review of Systems  Unable to perform ROS: Acuity of condition  Gastrointestinal: Positive for diarrhea.    Allergies  Cefuroxime axetil; Penicillins; Tessalon; and Shellfish allergy  Home Medications   Current Outpatient Rx  Name  Route  Sig  Dispense  Refill  . insulin aspart (NOVOLOG FLEXPEN) 100 UNIT/ML injection   Subcutaneous   Inject 1-17 Units into the  skin 4 (four) times daily -  before meals and at bedtime.   1 vial   6   . insulin glargine (LANTUS) 100 UNIT/ML injection   Subcutaneous   Inject 0.32 mLs (32 Units total) into the skin at bedtime.   10 mL   6   . metoCLOPramide (REGLAN) 10 MG tablet   Oral   Take 1 tablet (10 mg total) by mouth 3 (three) times daily.   21 tablet   0   . Omeprazole-Sodium Bicarbonate (ZEGERID OTC) 20-1100 MG CAPS   Oral   Take 1 capsule by mouth at bedtime.          . OxyCODONE (OXYCONTIN) 15 mg T12A   Oral   Take 1 tablet (15 mg total) by mouth every 12 (twelve) hours.   60 tablet   0   . pregabalin (LYRICA) 100 MG capsule   Oral   Take 1 capsule (100 mg total) by mouth 3 (three) times daily.   90 capsule   0   . promethazine (PHENERGAN) 25 MG suppository   Rectal   Place 1 suppository (25 mg total) rectally every 6 (six) hours as needed for nausea.   30 each   0   . zolpidem (AMBIEN) 10 MG tablet   Oral  Take 10 mg by mouth at bedtime as needed for sleep.          BP 84/51  Temp(Src) 98.9 F (37.2 C)  Resp 16  SpO2 100% Physical Exam  Nursing note and vitals reviewed. Constitutional: He is oriented to person, place, and time. Vital signs are normal. He appears well-developed.  Non-toxic appearance. He does not appear ill. He appears distressed.  HENT:  Head: Normocephalic and atraumatic.  Nose: Nose normal.  Mouth/Throat: Oropharynx is clear and moist. No oropharyngeal exudate.  Eyes: Conjunctivae and EOM are normal. Pupils are equal, round, and reactive to light. No scleral icterus.  Neck: Normal range of motion. Neck supple. No tracheal deviation, no edema, no erythema and normal range of motion present. No mass and no thyromegaly present.  Cardiovascular: Normal rate, regular rhythm, S1 normal, S2 normal, normal heart sounds, intact distal pulses and normal pulses.  Exam reveals no gallop and no friction rub.   No murmur heard. Pulses:      Radial pulses are 2+  on the right side, and 2+ on the left side.       Dorsalis pedis pulses are 2+ on the right side, and 2+ on the left side.  Pulmonary/Chest: Effort normal. No respiratory distress. He has wheezes. He has no rhonchi. He has no rales.  Abdominal: Soft. Normal appearance and bowel sounds are normal. He exhibits no distension, no ascites and no mass. There is no hepatosplenomegaly. There is no tenderness. There is no rebound, no guarding and no CVA tenderness.  Musculoskeletal: Normal range of motion. He exhibits no edema and no tenderness.  Lymphadenopathy:    He has no cervical adenopathy.  Neurological: He is alert and oriented to person, place, and time. He has normal strength. No cranial nerve deficit or sensory deficit. GCS eye subscore is 4. GCS verbal subscore is 5. GCS motor subscore is 6.  Skin: Skin is warm, dry and intact. Rash noted. No petechiae noted. He is not diaphoretic. No erythema. No pallor.  Chronic dry skin noted to BLE    ED Course  Procedures (including critical care time) Labs Reviewed  CBC WITH DIFFERENTIAL - Abnormal; Notable for the following:    Hemoglobin 12.4 (*)    HCT 36.1 (*)    Platelets 446 (*)    Monocytes Relative 13 (*)    Monocytes Absolute 1.2 (*)    All other components within normal limits  COMPREHENSIVE METABOLIC PANEL - Abnormal; Notable for the following:    Sodium 133 (*)    Chloride 92 (*)    Glucose, Bld 188 (*)    Creatinine, Ser 1.67 (*)    Total Protein 8.5 (*)    GFR calc non Af Amer 54 (*)    GFR calc Af Amer 63 (*)    All other components within normal limits  GLUCOSE, CAPILLARY - Abnormal; Notable for the following:    Glucose-Capillary 175 (*)    All other components within normal limits  POCT I-STAT, CHEM 8 - Abnormal; Notable for the following:    Chloride 94 (*)    Creatinine, Ser 1.80 (*)    Glucose, Bld 194 (*)    All other components within normal limits  POCT I-STAT 3, BLOOD GAS (G3P V) - Abnormal; Notable for the  following:    pH, Ven 7.358 (*)    pCO2, Ven 63.3 (*)    Bicarbonate 35.6 (*)    Acid-Base Excess 8.0 (*)    All other  components within normal limits  LIPASE, BLOOD  KETONES, QUALITATIVE  URINALYSIS, ROUTINE W REFLEX MICROSCOPIC   Dg Chest 2 View  04/18/2013   *RADIOLOGY REPORT*  Clinical Data: Syncope.  Hypotension.  Diarrhea.  CHEST - 2 VIEW  Comparison:  04/11/2013  Findings:  The heart size and mediastinal contours are within normal limits.  Both lungs are clear.  The visualized skeletal structures are unremarkable.  IMPRESSION: No active cardiopulmonary disease.   Original Report Authenticated By: Myles Rosenthal, M.D.   No diagnosis found.  Date: 04/18/2013  Rate: 100  Rhythm: sinus tachycardia  QRS Axis: normal  Intervals: normal  ST/T Wave abnormalities: early repolarization  Conduction Disutrbances:none  Narrative Interpretation:   Old EKG Reviewed: unchanged   MDM  Patient was initially hypotensive to the 80s, responding with short sentences and drowsy.  He was immediately given 2L IVF and laid flat on his back.  FS was >100, i stat BMP did not reveal an anion gap.  He was also given a duoneb for his wheezing which has since resolved.  This patient does not appear to be in DKA.  Upon repeat assessment, the patient is symptomatically better.  Will continue to reasses.   Patient continues to have acute on chronic back pain.  Given valium and morphine for relief.  Vitals remained stable.   Patient vital signs reveal orthostasis with a 20mm HG drop in his systolic despite 2L IVF.  His Cr did improve but has not normalized.  He continues to complain for severe back pain.  [facility].  He is amendable with this plan.  Tomasita Crumble, MD 04/18/13 2158

## 2013-04-18 NOTE — ED Notes (Signed)
Dr. Oni back at the bedside. 

## 2013-04-18 NOTE — ED Notes (Signed)
CBG 175. 

## 2013-04-18 NOTE — ED Notes (Signed)
[facility] from home. PT c/o of diarrhea starting 5 hours ago, weakness.

## 2013-04-18 NOTE — H&P (Signed)
Patient's PCP: Nelwyn Salisbury, MD  Chief Complaint: Loss of consciousness  History of Present Illness: Jason [name] is a 27 y.o. African American male with history of uncontrolled diabetes type 1 with recent hospitalization for DKA from 04/11/2013 till 04/14/2013 who presents with the above complaints.  Since discharge patient reports that he has been doing okay.  However over the last 2-3 days he has noted that he has been dizzy and lightheaded especially with standing or changing positions.  This morning at 10 a.m. on awakening the bathroom he felt dizzy and lightheaded.  He also had an episode of confusion and fell to the ground.  He also lost consciousness for a few seconds.  He has had episodes of loss of consciousness in the past due to dehydration.  This episode was similar to his previous episodes.  He also had 3 episodes of watery bowel movement today.  He presented to the emergency department for further evaluation.  He was found to be in acute renal failure with a creatinine of 1.8. The hospitalist service was asked to admit the patient for further care and management. Patient denies any recent fevers, but does complain of chills at home.  Denies any chest pain.  Initially he was short of breath which resolved with some hydration.  Denies any abdominal pain.  Denies any headaches or vision changes.  Does complain of chronic low back pain which he has had for months.    Review of Systems: All systems reviewed with the patient and positive as per history of present illness, otherwise all other systems are negative.  Past Medical History  Diagnosis Date  . Diabetes mellitus   . GERD (gastroesophageal reflux disease)   . Asthma   . Hx MRSA infection     on face  . Gastroparesis   . Diabetic neuropathy   . Seizures    Past Surgical History  Procedure Laterality Date  . Tonsillectomy     Family History  Problem Relation Age of Onset  . Diabetes Father   . Hypertension Father   .  Asthma      fhx  . Hypertension      fhx  . Stroke      fhx  . Heart disease Mother    History   Social History  . Marital Status: Single    Spouse Name: N/A    Number of Children: N/A  . Years of Education: N/A   Occupational History  . Not on file.   Social History Main Topics  . Smoking status: Current Every Day Smoker    Types: Cigars  . Smokeless tobacco: Never Used     Comment: 2 per day  . Alcohol Use: Yes     Comment: Rarely  . Drug Use: No  . Sexually Active: Not on file   Other Topics Concern  . Not on file   Social History Narrative  . No narrative on file   Allergies: Cefuroxime axetil; Penicillins; Tessalon; and Shellfish allergy  Home Meds: Prior to Admission medications   Medication Sig Start Date End Date Taking? Authorizing Provider  esomeprazole (NEXIUM) 20 MG capsule Take 20 mg by mouth daily before breakfast.   Yes Historical Provider, MD  insulin aspart (NOVOLOG) 100 UNIT/ML injection Inject 1-17 Units into the skin 4 (four) times daily. Per sliding scale   Yes Historical Provider, MD  insulin glargine (LANTUS) 100 UNIT/ML injection Inject 32 Units into the skin at bedtime.   Yes Historical Provider,  MD  metoCLOPramide (REGLAN) 10 MG tablet Take 10 mg by mouth 3 (three) times daily.   Yes Historical Provider, MD  Omeprazole-Sodium Bicarbonate (ZEGERID OTC) 20-1100 MG CAPS Take 1 capsule by mouth at bedtime.    Yes Historical Provider, MD  OxyCODONE (OXYCONTIN) 15 mg T12A Take 15 mg by mouth every 12 (twelve) hours.   Yes Historical Provider, MD  oxyCODONE-acetaminophen (PERCOCET) 10-325 MG per tablet Take 1 tablet by mouth every 4 (four) hours as needed for pain.   Yes Historical Provider, MD  pregabalin (LYRICA) 100 MG capsule Take 100 mg by mouth 3 (three) times daily.   Yes Historical Provider, MD  promethazine (PHENERGAN) 25 MG suppository Place 25 mg rectally every 6 (six) hours as needed for nausea.   Yes Historical Provider, MD  zolpidem  (AMBIEN) 10 MG tablet Take 10 mg by mouth at bedtime as needed for sleep.   Yes Historical Provider, MD    Physical Exam: Blood pressure 92/68, pulse 112, temperature 98.9 F (37.2 C), resp. rate 13, SpO2 100.00%. General: Awake, Oriented x3, No acute distress. HEENT: EOMI, Moist mucous membranes Neck: Supple CV: S1 and S2 Lungs: Clear to ascultation bilaterally Abdomen: Soft, Nontender, Nondistended, +bowel sounds. Ext: Good pulses. Trace edema. No clubbing or cyanosis noted. Neuro: Cranial Nerves II-XII grossly intact. Has 5/5 motor strength in upper and lower extremities. Back: Tense paraspinous muscles on the right.   Lab results:  Recent Labs  04/18/13 1715 04/18/13 1732 04/18/13 2106  NA 133* 136 134*  K 4.5 4.9 4.9  CL 92* 94* 96  CO2 31  --   --   GLUCOSE 188* 194* 386*  BUN 11 13 9   CREATININE 1.67* 1.80* 1.50*  CALCIUM 10.5  --   --     Recent Labs  04/18/13 1715  AST 14  ALT 16  ALKPHOS 105  BILITOT 0.3  PROT 8.5*  ALBUMIN 3.7    Recent Labs  04/18/13 1715  LIPASE 21    Recent Labs  04/18/13 1715 04/18/13 1732 04/18/13 2106  WBC 9.1  --   --   NEUTROABS 4.7  --   --   HGB 12.4* 13.9 11.2*  HCT 36.1* 41.0 33.0*  MCV 78.8  --   --   PLT 446*  --   --    No results found for this basename: CKTOTAL, CKMB, CKMBINDEX, TROPONINI,  in the last 72 hours No components found with this basename: POCBNP,  No results found for this basename: DDIMER,  in the last 72 hours No results found for this basename: HGBA1C,  in the last 72 hours No results found for this basename: CHOL, HDL, LDLCALC, TRIG, CHOLHDL, LDLDIRECT,  in the last 72 hours No results found for this basename: TSH, T4TOTAL, FREET3, T3FREE, THYROIDAB,  in the last 72 hours No results found for this basename: VITAMINB12, FOLATE, FERRITIN, TIBC, IRON, RETICCTPCT,  in the last 72 hours Imaging results:  Ct Abdomen Pelvis Wo Contrast  04/11/2013   *RADIOLOGY REPORT*  Clinical Data: Abdominal  tenderness.  Nausea and vomiting. Weakness.  Back pain.  CT ABDOMEN AND PELVIS WITHOUT CONTRAST  Technique:  Multidetector CT imaging of the abdomen and pelvis was performed following the standard protocol without intravenous contrast.  Comparison: Report from ultrasound dated 02/23/2001.  Findings: The visualized portion of the liver, spleen, pancreas, and adrenal glands appear unremarkable in noncontrast CT appearance.  Small contour the gallbladder suggests the contraction.  2 mm right kidney lower pole nonobstructive  calculus noted along with a 102 mm right mid kidney nonobstructive calculus.  Several punctate left renal calcifications are present including a 2 mm of left mid kidney calculus.  Diffuse low-level mesenteric stranding noted with trace free pelvic fluid.  Bilateral pelvic phleboliths are present without ureteral or bladder calculus.  Urinary bladder is considerably distended, extending up to the level of the iliac crests.  Given the paucity of intra-abdominal fat and lack of oral and IV contrast, separation of abdominal structures is problematic.  There is some faint stranding in the right lower quadrant adjacent to the appendix, but the appendix is gas-filled and does not demonstrate wall thickening, and the stranding is similar quality to the diffuse stranding in the mesentery.  No dilated bowel observed.  IMPRESSION:  1.  Diffuse low-level mesenteric stranding of uncertain significance.  There is a trace amount of free pelvic fluid.  No dilated bowel observed. 2.  Some of this stranding is in the vicinity of the appendix, but the appendix is gas-filled and thin-walled, and accordingly the appearance is not convincing for acute appendicitis. 3.  Bilateral nonobstructive nephrolithiasis. 4.  Distended urinary bladder.   Original Report Authenticated By: Gaylyn Rong, M.D.   Dg Chest 2 View  04/18/2013   *RADIOLOGY REPORT*  Clinical Data: Syncope.  Hypotension.  Diarrhea.  CHEST - 2 VIEW   Comparison:  04/11/2013  Findings:  The heart size and mediastinal contours are within normal limits.  Both lungs are clear.  The visualized skeletal structures are unremarkable.  IMPRESSION: No active cardiopulmonary disease.   Original Report Authenticated By: Myles Rosenthal, M.D.   Dg Chest 2 View  04/11/2013   *RADIOLOGY REPORT*  Clinical Data: Chest pain and shortness of breath  CHEST - 2 VIEW  Comparison: March 20, 2013  Findings:  Lungs clear.  Heart size and pulmonary vascularity are normal.  No adenopathy.  No pneumothorax.  No bone lesions.  IMPRESSION: No abnormality noted.   Original Report Authenticated By: Bretta Bang, M.D.   Dg Chest 2 View  03/20/2013   *RADIOLOGY REPORT*  Clinical Data: Complaint of fever, neuropathy, and dental pain.  CHEST - 2 VIEW  Comparison: 02/15/2010.  Findings: The heart size and pulmonary vascularity are normal. The lungs appear clear and expanded without focal air space disease or consolidation. No blunting of the costophrenic angles.  No pneumothorax.  Mediastinal contours appear intact.  No significant changes since the previous study.  IMPRESSION: No evidence of active pulmonary disease.   Original Report Authenticated By: Burman Nieves, M.D.   Ct Maxillofacial W/cm  03/20/2013   *RADIOLOGY REPORT*  Clinical Data: Left upper mouth pain.  CT MAXILLOFACIAL WITH CONTRAST  Technique:  Multidetector CT imaging of the maxillofacial structures was performed with intravenous contrast. Multiplanar CT image reconstructions were also generated.  Contrast: 80mL OMNIPAQUE IOHEXOL 300 MG/ML  SOLN  Comparison: None.  Findings: Multiple dental caries are demonstrated bilaterally. Peri apical lucencies are suggested at the left upper second molar suggesting periodontal disease.  No discrete abscess is demonstrated.  No abnormal soft tissue and contrast enhancement or fluid collection.  Mucosal membrane thickening in the left maxillary antrum and bilateral ethmoid air cells  consistent with chronic inflammation.  No acute air-fluid levels are demonstrated in the paranasal sinuses.  Prominent level II lymph nodes are demonstrated on the left consistent with reactive nodes. Visualized orbital and facial bones appear intact without displaced fracture identified.  Deformity of the nasal bones suggest old fracture deformity.  Visualized  cervical carotid and jugular vessels are patent.  Salivary glands appear symmetrical.  No displacement of the cervical fat planes.  No prevertebral soft tissue swelling or mucosal infiltration is demonstrated.  IMPRESSION: Poor dentition with multiple dental caries.  Peri apical lucency around the left second molar.  No discrete abscess is demonstrated. Chronic inflammatory changes in the paranasal sinuses.   Original Report Authenticated By: Burman Nieves, M.D.   Other results: EKG:  sinus tachycardia unchanged from previous EKG .  Assessment & Plan by Problem: Syncope and collapse Likely due to dehydration.  Patient was orthostatic and hypotensive initially on presentation which improved with IV hydration.  Patient was counseled on maintaining adequate hydration at home with water, Gatorade, or Powerade.  He was instructed to avoid sodas or caffeine products to maintain hydration at home.  Patient is neurologically intact, cannot see the need for further neuro imaging at this time.  Continue to monitor on telemetry, cycle troponins x2.  Acute renal failure due to dehydration  Continue aggressive fluid resuscitation with normal saline, has received 2 L of normal saline with improvement in blood pressure and symptoms.  Reassess volume status and renal function in the morning.  Type 1 diabetes uncontrolled with complications Continue home insulin regimen.  Sliding scale insulin.  Not in DKA at this time.  Diabetic diet.  Asthma Stable.  When necessary breathing treatments.  Tobacco abuse Patient counseled on cessation.  Nicotine patch  prescribed.  Chronic low back pain Likely due to musculoskeletal back pain.  When necessary Flexeril for muscle spasms.  Continue home long-acting oxycodone and Percocet for pain control.  Diarrhea Likely due to gastritis.  If still persistent may need further workup.  Tachycardia Likely due to dehydration.  Continue fluids as indicated above.  Prophylaxis Lovenox.  CODE STATUS Full code.  Disposition Admit the patient to telemetry as observation.  If renal function improves and is no longer orthostatic the morning may consider discharge.  Time spent on admission, talking to the patient, and coordinating care was: 50 mins.  Raffi Milstein A, MD 04/18/2013, 10:53 PM

## 2013-04-18 NOTE — ED Notes (Signed)
Pt returned from xray

## 2013-04-18 NOTE — ED Notes (Signed)
RT at the bedside.

## 2013-04-19 LAB — BASIC METABOLIC PANEL
BUN: 9 mg/dL (ref 6–23)
CO2: 27 mEq/L (ref 19–32)
Calcium: 8.9 mg/dL (ref 8.4–10.5)
Chloride: 96 mEq/L (ref 96–112)
Creatinine, Ser: 1.12 mg/dL (ref 0.50–1.35)
GFR calc Af Amer: >90 mL/min (ref >90)
GFR calc Af Amer: >90 mL/min (ref >90)
GFR calc non Af Amer: 88 mL/min — ABNORMAL LOW (ref >90)
GFR calc non Af Amer: >90 mL/min (ref >90)
Glucose, Bld: 251 mg/dL — ABNORMAL HIGH (ref 70–99)
Glucose, Bld: 158 mg/dL — ABNORMAL HIGH (ref 70–99)
Potassium: 3.8 mEq/L (ref 3.5–5.1)
Potassium: 3.8 mEq/L (ref 3.5–5.1)
Sodium: 133 mEq/L — ABNORMAL LOW (ref 135–145)
Sodium: 136 mEq/L (ref 135–145)

## 2013-04-19 LAB — CREATININE, SERUM
Creatinine, Ser: 1.16 mg/dL (ref 0.50–1.35)
GFR calc non Af Amer: 84 mL/min — ABNORMAL LOW (ref >90)

## 2013-04-19 LAB — CBC
HCT: 32.5 % — ABNORMAL LOW (ref 39.0–52.0)
Hemoglobin: 10.9 g/dL — ABNORMAL LOW (ref 13.0–17.0)
MCH: 26.8 pg (ref 26.0–34.0)
MCHC: 33.5 g/dL (ref 30.0–36.0)
MCHC: 33.8 g/dL (ref 30.0–36.0)
MCV: 79.9 fL (ref 78.0–100.0)
Platelets: 444 10*3/uL — ABNORMAL HIGH (ref 150–400)
RBC: 4.07 MIL/uL — ABNORMAL LOW (ref 4.22–5.81)
RDW: 13.4 % (ref 11.5–15.5)
RDW: 13.5 % (ref 11.5–15.5)
WBC: 8.6 10*3/uL (ref 4.0–10.5)

## 2013-04-19 LAB — TROPONIN I
Troponin I: <0.3 ng/mL (ref <0.30)
Troponin I: <0.3 ng/mL (ref <0.30)

## 2013-04-19 LAB — GLUCOSE, CAPILLARY: Glucose-Capillary: 324 mg/dL — ABNORMAL HIGH (ref 70–99)

## 2013-04-19 NOTE — Progress Notes (Signed)
Discharge instructions reviewed with patient and significant other.  Patient verbalized understanding.  All questions answered.  Pt d/c via wheelchair in stable condition.

## 2013-04-19 NOTE — Progress Notes (Deleted)
Physician Discharge Summary  Jason [name] [place] ZOX:[id number] DOB: [birth year] DOA: 04/18/2013  PCP: Nelwyn Salisbury, MD  Admit date: 04/18/2013 Discharge date: 04/19/2013  Time spent: 35 minutes  Recommendations for Outpatient Follow-up:  1. Recommend frank discussion with patient regarding polyneuropathy and need for there is opiates as I suspect they play a role in patients dizziness 2. Patient to keep himself have hydrated   Discharge Diagnoses:  Principal Problem:   Syncope and collapse Active Problems:   Type 1 diabetes, uncontrolled, with neuropathy   ASTHMA   GERD   Tobacco abuse   Dehydration   Acute renal failure   Chronic low back pain   Diarrhea   Discharge Condition: Good   Diet recommendation: Diabetic  There were no vitals filed for this visit.  History of present illness:  This 27 year old male was admitted over the past 3 days after being recently discharged 04/11/2013 to 04/14/13 with dizziness and lightheadedness especially with changing positions. he states this happens mainly when he has uncontrolled blood sugars and becomes volume depleted but  he had 3 episodes of watery bowel movements and was admitted with a creatinine 1.8. And was noted to have acute renal failure which responded well as below He did well with IV fluid repletion and ambulated without any further dizziness or blurred double vision. She blood sugars were well-controlled there was no concern for repeat DKA.  Was discharged home in stable state and recommended to back is opiates as this may be a part of this as well   Discharge Exam: Filed Vitals:   04/18/13 2115 04/18/13 2200 04/18/13 2245 04/19/13 0725  BP: 131/91 116/73 123/84 112/79  Pulse: 101 98 95 88  Temp:    97.7 F (36.5 C)  Resp: 23 21 11 16   SpO2: 100% 100% 100% 100%    General: Pleasant alert oriented  Cardiovascular: S1-S2 no murmur rub or gallop  Respiratory: Clinically clear   Discharge Instructions   Future  Appointments Provider Department Dept Phone   04/27/2013 10:00 AM Beverley Fiedler, MD [facility] [phone]       Medication List    ASK your doctor about these medications       esomeprazole 20 MG capsule  Commonly known as:  NEXIUM  Take 20 mg by mouth daily before breakfast.     insulin aspart 100 UNIT/ML injection  Commonly known as:  novoLOG  Inject 1-17 Units into the skin 4 (four) times daily. Per sliding scale     insulin glargine 100 UNIT/ML injection  Commonly known as:  LANTUS  Inject 32 Units into the skin at bedtime.     metoCLOPramide 10 MG tablet  Commonly known as:  REGLAN  Take 10 mg by mouth 3 (three) times daily.     oxyCODONE-acetaminophen 10-325 MG per tablet  Commonly known as:  PERCOCET  Take 1 tablet by mouth every 4 (four) hours as needed for pain.     OXYCONTIN 15 mg T12a  Generic drug:  OxyCODONE  Take 15 mg by mouth every 12 (twelve) hours.     pregabalin 100 MG capsule  Commonly known as:  LYRICA  Take 100 mg by mouth 3 (three) times daily.     promethazine 25 MG suppository  Commonly known as:  PHENERGAN  Place 25 mg rectally every 6 (six) hours as needed for nausea.     ZEGERID OTC 20-1100 MG Caps  Generic drug:  Omeprazole-Sodium Bicarbonate  Take 1 capsule by mouth  at bedtime.     zolpidem 10 MG tablet  Commonly known as:  AMBIEN  Take 10 mg by mouth at bedtime as needed for sleep.       Allergies  Allergen Reactions  . Cefuroxime Axetil Anaphylaxis  . Penicillins Anaphylaxis  . Tessalon (Benzonatate) Anaphylaxis    Can take amoxicillin  . Shellfish Allergy Itching      The results of significant diagnostics from this hospitalization (including imaging, microbiology, ancillary and laboratory) are listed below for reference.    Significant Diagnostic Studies: Ct Abdomen Pelvis Wo Contrast  04/11/2013   *RADIOLOGY REPORT*  Clinical Data: Abdominal tenderness.  Nausea and vomiting. Weakness.  Back  pain.  CT ABDOMEN AND PELVIS WITHOUT CONTRAST  Technique:  Multidetector CT imaging of the abdomen and pelvis was performed following the standard protocol without intravenous contrast.  Comparison: Report from ultrasound dated 02/23/2001.  Findings: The visualized portion of the liver, spleen, pancreas, and adrenal glands appear unremarkable in noncontrast CT appearance.  Small contour the gallbladder suggests the contraction.  2 mm right kidney lower pole nonobstructive calculus noted along with a 102 mm right mid kidney nonobstructive calculus.  Several punctate left renal calcifications are present including a 2 mm of left mid kidney calculus.  Diffuse low-level mesenteric stranding noted with trace free pelvic fluid.  Bilateral pelvic phleboliths are present without ureteral or bladder calculus.  Urinary bladder is considerably distended, extending up to the level of the iliac crests.  Given the paucity of intra-abdominal fat and lack of oral and IV contrast, separation of abdominal structures is problematic.  There is some faint stranding in the right lower quadrant adjacent to the appendix, but the appendix is gas-filled and does not demonstrate wall thickening, and the stranding is similar quality to the diffuse stranding in the mesentery.  No dilated bowel observed.  IMPRESSION:  1.  Diffuse low-level mesenteric stranding of uncertain significance.  There is a trace amount of free pelvic fluid.  No dilated bowel observed. 2.  Some of this stranding is in the vicinity of the appendix, but the appendix is gas-filled and thin-walled, and accordingly the appearance is not convincing for acute appendicitis. 3.  Bilateral nonobstructive nephrolithiasis. 4.  Distended urinary bladder.   Original Report Authenticated By: Gaylyn Rong, M.D.   Dg Chest 2 View  04/18/2013   *RADIOLOGY REPORT*  Clinical Data: Syncope.  Hypotension.  Diarrhea.  CHEST - 2 VIEW  Comparison:  04/11/2013  Findings:  The heart size  and mediastinal contours are within normal limits.  Both lungs are clear.  The visualized skeletal structures are unremarkable.  IMPRESSION: No active cardiopulmonary disease.   Original Report Authenticated By: Myles Rosenthal, M.D.   Dg Chest 2 View  04/11/2013   *RADIOLOGY REPORT*  Clinical Data: Chest pain and shortness of breath  CHEST - 2 VIEW  Comparison: March 20, 2013  Findings:  Lungs clear.  Heart size and pulmonary vascularity are normal.  No adenopathy.  No pneumothorax.  No bone lesions.  IMPRESSION: No abnormality noted.   Original Report Authenticated By: Bretta Bang, M.D.    Microbiology: No results found for this or any previous visit (from the past 240 hour(s)).   Labs: Basic Metabolic Panel:  Recent Labs Lab 04/14/13 0512 04/18/13 1715 04/18/13 1732 04/18/13 2106 04/19/13 0041 04/19/13 0550 04/19/13 0754  NA 138 133* 136 134*  --  133* 136  K 4.0 4.5 4.9 4.9  --  3.8 3.8  CL 104 92* 94*  96  --  96 98  CO2 30 31  --   --   --  27 30  GLUCOSE 170* 188* 194* 386*  --  251* 158*  BUN 6 11 13 9   --  9 8  CREATININE 0.72 1.67* 1.80* 1.50* 1.16 1.12 1.05  CALCIUM 8.1* 10.5  --   --   --  8.9 9.2   Liver Function Tests:  Recent Labs Lab 04/18/13 1715  AST 14  ALT 16  ALKPHOS 105  BILITOT 0.3  PROT 8.5*  ALBUMIN 3.7    Recent Labs Lab 04/18/13 1715  LIPASE 21   No results found for this basename: AMMONIA,  in the last 168 hours CBC:  Recent Labs Lab 04/18/13 1715 04/18/13 1732 04/18/13 2106 04/19/13 0041 04/19/13 0550  WBC 9.1  --   --  8.6 8.1  NEUTROABS 4.7  --   --   --   --   HGB 12.4* 13.9 11.2* 10.9* 9.6*  HCT 36.1* 41.0 33.0* 32.5* 28.4*  MCV 78.8  --   --  79.9 80.2  PLT 446*  --   --  444* 385   Cardiac Enzymes:  Recent Labs Lab 04/19/13 0038 04/19/13 0550  TROPONINI <0.30 <0.30   BNP: BNP (last 3 results) No results found for this basename: PROBNP,  in the last 8760 hours CBG:  Recent Labs Lab 04/14/13 1217  04/18/13 1715 04/18/13 2239 04/18/13 2356 04/19/13 0727  GLUCAP 119* 175* 358* 324* 186*       Signed:  Rhetta Mura  [facility] 04/19/2013, 12:11 PM

## 2013-04-20 ENCOUNTER — Telehealth: Payer: Self-pay | Admitting: Internal Medicine

## 2013-04-20 NOTE — Discharge Summary (Signed)
Jason Mura, MD Physician Signed Internal Medicine Progress Notes Service date: 04/19/2013 12:11 PM  Physician Discharge Summary    Jason [name] [name] DOB: 1986-10-04 DOA: 04/18/2013   PCP: Jason Salisbury, MD   Admit date: 04/18/2013 Discharge date: 04/19/2013   Time spent: 35 minutes   Recommendations for Outpatient Follow-up:   Recommend frank discussion with patient regarding polyneuropathy and need for there is opiates as I suspect they play a role in patients dizziness Patient to keep himself have hydrated     Discharge Diagnoses:   Principal Problem:   Syncope and collapse Active Problems:   Type 1 diabetes, uncontrolled, with neuropathy   ASTHMA   GERD   Tobacco abuse   Dehydration   Acute renal failure   Chronic low back pain   Diarrhea   Discharge Condition: Good    Diet recommendation: Diabetic   There were no vitals filed for this visit.   History of present illness:   This 27 year old male was admitted over the past 3 days after being recently discharged 04/11/2013 to 04/14/13 with dizziness and lightheadedness especially with changing positions. he states this happens mainly when he has uncontrolled blood sugars and becomes volume depleted but  he had 3 episodes of watery bowel movements and was admitted with a creatinine 1.8. And was noted to have acute renal failure which responded well as below He did well with IV fluid repletion and ambulated without any further dizziness or blurred double vision. She blood sugars were well-controlled there was no concern for repeat DKA.   Was discharged home in stable state and recommended to back is opiates as this may be a part of this as well    Discharge Exam: Filed Vitals:     04/18/13 2115  04/18/13 2200  04/18/13 2245  04/19/13 0725   BP:  131/91  116/73  123/84  112/79   Pulse:  101  98  95  88   Temp:        97.7 F (36.5 C)   Resp:  23  21  11  16    SpO2:  100%  100%  100%  100%       General: Pleasant alert oriented  Cardiovascular: S1-S2 no murmur rub or gallop  Respiratory: Clinically clear    Discharge Instructions      Future Appointments  Provider  Department  Dept Phone     04/27/2013 10:00 AM  Beverley Fiedler, MD  [facility]  [phone]          Medication List      ASK your doctor about these medications           esomeprazole 20 MG capsule   Commonly known as:  NEXIUM   Take 20 mg by mouth daily before breakfast.         insulin aspart 100 UNIT/ML injection   Commonly known as:  novoLOG   Inject 1-17 Units into the skin 4 (four) times daily. Per sliding scale         insulin glargine 100 UNIT/ML injection   Commonly known as:  LANTUS   Inject 32 Units into the skin at bedtime.         metoCLOPramide 10 MG tablet   Commonly known as:  REGLAN   Take 10 mg by mouth 3 (three) times daily.         oxyCODONE-acetaminophen 10-325 MG per tablet   Commonly known as:  PERCOCET  Take 1 tablet by mouth every 4 (four) hours as needed for pain.         OXYCONTIN 15 mg T12a   Generic drug:  OxyCODONE   Take 15 mg by mouth every 12 (twelve) hours.         pregabalin 100 MG capsule   Commonly known as:  LYRICA   Take 100 mg by mouth 3 (three) times daily.         promethazine 25 MG suppository   Commonly known as:  PHENERGAN   Place 25 mg rectally every 6 (six) hours as needed for nausea.         ZEGERID OTC 20-1100 MG Caps   Generic drug:  Omeprazole-Sodium Bicarbonate   Take 1 capsule by mouth at bedtime.         zolpidem 10 MG tablet   Commonly known as:  AMBIEN   Take 10 mg by mouth at bedtime as needed for sleep.           Allergies   Allergen  Reactions   .  Cefuroxime Axetil  Anaphylaxis   .  Penicillins  Anaphylaxis   .  Tessalon (Benzonatate)  Anaphylaxis       Can take amoxicillin   .  Shellfish Allergy  Itching        --------------------------------------------------------------------------------   The results of significant diagnostics from this hospitalization (including imaging, microbiology, ancillary and laboratory) are listed below for reference.       Significant Diagnostic Studies: Ct Abdomen Pelvis Wo Contrast   04/11/2013   *RADIOLOGY REPORT*  Clinical Data: Abdominal tenderness.  Nausea and vomiting. Weakness.  Back pain.  CT ABDOMEN AND PELVIS WITHOUT CONTRAST  Technique:  Multidetector CT imaging of the abdomen and pelvis was performed following the standard protocol without intravenous contrast.  Comparison: Report from ultrasound dated 02/23/2001.  Findings: The visualized portion of the liver, spleen, pancreas, and adrenal glands appear unremarkable in noncontrast CT appearance.  Small contour the gallbladder suggests the contraction.  2 mm right kidney lower pole nonobstructive calculus noted along with a 102 mm right mid kidney nonobstructive calculus.  Several punctate left renal calcifications are present including a 2 mm of left mid kidney calculus.  Diffuse low-level mesenteric stranding noted with trace free pelvic fluid.  Bilateral pelvic phleboliths are present without ureteral or bladder calculus.  Urinary bladder is considerably distended, extending up to the level of the iliac crests.  Given the paucity of intra-abdominal fat and lack of oral and IV contrast, separation of abdominal structures is problematic.  There is some faint stranding in the right lower quadrant adjacent to the appendix, but the appendix is gas-filled and does not demonstrate wall thickening, and the stranding is similar quality to the diffuse stranding in the mesentery.  No dilated bowel observed.  IMPRESSION:  1.  Diffuse low-level mesenteric stranding of uncertain significance.  There is a trace amount of free pelvic fluid.  No dilated bowel observed. 2.  Some of this stranding is in the vicinity of the  appendix, but the appendix is gas-filled and thin-walled, and accordingly the appearance is not convincing for acute appendicitis. 3.  Bilateral nonobstructive nephrolithiasis. 4.  Distended urinary bladder.   Original Report Authenticated By: Gaylyn Rong, M.D.    Dg Chest 2 View   04/18/2013   *RADIOLOGY REPORT*  Clinical Data: Syncope.  Hypotension.  Diarrhea.  CHEST - 2 VIEW  Comparison:  04/11/2013  Findings:  The heart size and mediastinal  contours are within normal limits.  Both lungs are clear.  The visualized skeletal structures are unremarkable.  IMPRESSION: No active cardiopulmonary disease.   Original Report Authenticated By: Myles Rosenthal, M.D.    Dg Chest 2 View   04/11/2013   *RADIOLOGY REPORT*  Clinical Data: Chest pain and shortness of breath  CHEST - 2 VIEW  Comparison: March 20, 2013  Findings:  Lungs clear.  Heart size and pulmonary vascularity are normal.  No adenopathy.  No pneumothorax.  No bone lesions. IMPRESSION: No abnormality noted.   Original Report Authenticated By: Bretta Bang, M.D.      Microbiology: No results found for this or any previous visit (from the past 240 hour(s)).    Labs: Basic Metabolic Panel: Recent Labs Lab  04/14/13 0512  04/18/13 1715  04/18/13 1732  04/18/13 2106  04/19/13 0041  04/19/13 0550  04/19/13 0754   NA  138  133*  136  134*   --   133*  136   K  4.0  4.5  4.9  4.9   --   3.8  3.8   CL  104  92*  94*  96   --   96  98   CO2  30  31   --    --    --   27  30   GLUCOSE  170*  188*  194*  386*   --   251*  158*   BUN  6  11  13  9    --   9  8   CREATININE  0.72  1.67*  1.80*  1.50*  1.16  1.12  1.05   CALCIUM  8.1*  10.5   --    --    --   8.9  9.2    Liver Function Tests: Recent Labs Lab  04/18/13 1715   AST  14   ALT  16   ALKPHOS  105   BILITOT  0.3   PROT  8.5*   ALBUMIN  3.7    Recent Labs Lab  04/18/13 1715   LIPASE  21    No results found for this basename: AMMONIA,  in the last 168  hours CBC: Recent Labs Lab  04/18/13 1715  04/18/13 1732  04/18/13 2106  04/19/13 0041  04/19/13 0550   WBC  9.1   --    --   8.6  8.1   NEUTROABS  4.7   --    --    --    --    HGB  12.4*  13.9  11.2*  10.9*  9.6*   HCT  36.1*  41.0  33.0*  32.5*  28.4*   MCV  78.8   --    --   79.9  80.2   PLT  446*   --    --   444*  385    Cardiac Enzymes: Recent Labs Lab  04/19/13 0038  04/19/13 0550   TROPONINI  <0.30  <0.30    BNP: BNP (last 3 results) No results found for this basename: PROBNP,  in the last 8760 hours CBG: Recent Labs Lab  04/14/13 1217  04/18/13 1715  04/18/13 2239  04/18/13 2356  04/19/13 0727   GLUCAP  119*  175*  358*  324*  186*            Signed:   Rhetta [name]        [facility] 04/19/2013,  12:11 PM

## 2013-04-21 NOTE — Telephone Encounter (Signed)
Pt seen by Dr Rhea Belton on 03/31/13 and he was started on Reglan among other interventions; he was to f/u in 2 weeks after the Reglan, but he reports he was in the hospital. Pt was admitted on 04/11/13 and then again on 04/18/13. Today pt states he gets diarrhea when he eats almost anything. When asked about antidiarrheals,he stated he took 1 Imodium yesterday and the diarrhea stopped. He also reports on 2 occasions his legs felt very weak and that he could not "use them". He states this did not happen in the hospital when receiving IV reglan. Pt will se Mike Gip, PA on 04/23/13 and he has an EGD on 04/27/13. Pt stated understanding.

## 2013-04-23 ENCOUNTER — Ambulatory Visit (INDEPENDENT_AMBULATORY_CARE_PROVIDER_SITE_OTHER): Payer: Medicaid Other | Admitting: Physician Assistant

## 2013-04-23 ENCOUNTER — Encounter: Payer: Self-pay | Admitting: Physician Assistant

## 2013-04-23 VITALS — BP 90/60 | HR 112 | Ht 68.0 in | Wt 127.2 lb

## 2013-04-23 DIAGNOSIS — R197 Diarrhea, unspecified: Secondary | ICD-10-CM

## 2013-04-23 DIAGNOSIS — M79609 Pain in unspecified limb: Secondary | ICD-10-CM

## 2013-04-23 DIAGNOSIS — E1149 Type 2 diabetes mellitus with other diabetic neurological complication: Secondary | ICD-10-CM

## 2013-04-23 DIAGNOSIS — M79604 Pain in right leg: Secondary | ICD-10-CM

## 2013-04-23 DIAGNOSIS — K3184 Gastroparesis: Secondary | ICD-10-CM

## 2013-04-23 NOTE — Progress Notes (Addendum)
Subjective:    Patient ID: Jason [name], male    DOB: 1985-11-14, 27 y.o.   MRN: [id number]  HPI Jason [name] is a 70 H. or old [place] American male recently known to Dr. Rhea [name] with history of poorly controlled type 1 diabetes associated with neuropathy. He has had several prior dimensions for DKA. He was referred for complaints of abdominal pain nausea and vomiting and seen on June 24. He was felt to have diabetic gastroparesis which is being exacerbated by chronic narcotic pain medication usage. He was started on Reglan 10 mg 4 times daily, continue on PPI therapy and scheduled for an upper endoscopy. Since that office visit patient has had 2 hospitalizations both very brief and one with complaints of diarrhea and dehydration . He has continued on the Reglan 4 times daily but says that the Reglan is giving him diarrhea. He is unable to be specific about how many bowel movements he is having per day but says that he will have several. It is not noticing any melena or hematochezia. His appetite is better he's eating and is not vomiting. He has also developed some stiffness and soreness bilaterally in his lower extremities. He says he feels as if he's been in one position for 10 hours when he tries to move. His upper endoscopy is scheduled for next week    Review of Systems  Constitutional: Negative.   HENT: Negative.   Respiratory: Negative.   Gastrointestinal: Positive for nausea and diarrhea.  Endocrine: Negative.   Genitourinary: Negative.   Musculoskeletal: Positive for back pain and arthralgias.  Skin: Negative.   Allergic/Immunologic: Negative.   Neurological: Negative.   Hematological: Negative.   Psychiatric/Behavioral: Negative.    Outpatient Prescriptions Prior to Visit  Medication Sig Dispense Refill  . esomeprazole (NEXIUM) 20 MG capsule Take 20 mg by mouth daily before breakfast.      . insulin aspart (NOVOLOG) 100 UNIT/ML injection Inject 1-17 Units into the skin 4 (four)  times daily. Per sliding scale      . insulin glargine (LANTUS) 100 UNIT/ML injection Inject 32 Units into the skin at bedtime.      . metoCLOPramide (REGLAN) 10 MG tablet Take 10 mg by mouth 3 (three) times daily.      Jason [name] Bicarbonate (ZEGERID OTC) 20-1100 MG CAPS Take 1 capsule by mouth at bedtime.       . OxyCODONE (OXYCONTIN) 15 mg T12A Take 15 mg by mouth every 12 (twelve) hours.      Marland Kitchen oxyCODONE-acetaminophen (PERCOCET) 10-325 MG per tablet Take 1 tablet by mouth every 4 (four) hours as needed for pain.      . pregabalin (LYRICA) 100 MG capsule Take 100 mg by mouth 3 (three) times daily.      . promethazine (PHENERGAN) 25 MG suppository Place 25 mg rectally every 6 (six) hours as needed for nausea.      Marland Kitchen zolpidem (AMBIEN) 10 MG tablet Take 10 mg by mouth at bedtime as needed for sleep.       No facility-administered medications prior to visit.   Allergies  Allergen Reactions  . Cefuroxime Axetil Anaphylaxis  . Penicillins Anaphylaxis  . Tessalon (Benzonatate) Anaphylaxis    Can take amoxicillin  . Shellfish Allergy Itching   Patient Active Problem List   Diagnosis Date Noted  . Acute renal failure 04/18/2013  . Syncope and collapse 04/18/2013  . Chronic low back pain 04/18/2013  . Diarrhea 04/18/2013  . DKA (diabetic ketoacidoses) 04/11/2013  .  Dental abscess 03/20/2013  . Hyperglycemia 03/20/2013  . Dehydration 03/20/2013  . Painful diabetic neuropathy 03/20/2013  . Diabetic hyperosmolar non-ketotic state 01/29/2013  . Nausea and vomiting 01/29/2013  . Traumatic ecchymosis of toe 01/29/2013  . Wound cellulitis 01/29/2013  . Tobacco abuse 01/29/2013  . Noncompliance with diet and medication regimen 01/29/2013  . CELLULITIS AND ABSCESS OF FACE 08/27/2009  . SPRAIN&STRAIN OTH SPEC SITES SHOULDER&UPPER ARM 06/09/2008  . CONTUSION OF CHEST WALL 06/09/2008  . VIRAL URI 09/15/2007  . GERD 09/15/2007  . Type 1 diabetes, uncontrolled, with neuropathy  05/29/2007  . ASTHMA 05/29/2007   History  Substance Use Topics  . Smoking status: Current Every Day Smoker    Types: Cigars  . Smokeless tobacco: Never Used     Comment: 2 per day  . Alcohol Use: Yes     Comment: Rarely      family history includes Asthma in an unspecified family member; Diabetes in his father; Heart disease in his mother; Hypertension in his father and unspecified family member; and Stroke in an unspecified family member.  Objective:   Physical Exam Well-developed young thin African American male in no acute distress. Blood pressure 90/60 pulse 112 height 5 foot 8 weight 127. HEENT; nontraumatic normocephalic EOMI PERRLA sclera anicteric, Cardiovascular; regular rate and rhythm with S1-S2 mild tachycardia, Pulmonar;y clear bilaterally, Abdomen; soft nondistended nontender bowel sounds are active there is no palpable mass or hepatosplenomegaly, Rectal ;exam not done, Extremities; no clubbing cyanosis or edema skin warm and dry, Psych; mood and affect normal and appropriate       Assessment & Plan:   #36 27 year old [place] American male with type 1 diabetes poorly controlled with secondary nausea and vomiting do to diabetic gastroparesis. Patient was recently started on Reglan 10 mg 4 times daily which is helping his nausea and vomiting however he comes in now with side effect of diarrhea and bilateral lower extremity pain and stiffness.  Plan; Do not feel comfortable leaving him on Reglan at this point with the lower extremity symptoms that sound like an extrapyramidal side effect. We attempted to start him on erythromycin oral suspension however the generic is no longer available and  Ery-ped suspension would apparently cost several hundred per month. For now he is encouraged to stay on a gastroparesis diet, keep his blood sugar under tight control and is also advised to try to decrease pain medication usage. In the long run his GI symptoms would significantly benefit  from stopping narcotics. He will keep his appointment for upper endoscopy on 04/27/2013 .  Addendum: Reviewed and agree with management. Difficult situation because reglan probably helps the gastroparesis which is related to DM1 and narcotic use, and without it gastroparesis is more likely to return leading to difficulty with N/V and also blood glucose control. Still with side-effects we will stop reglan and plan as above Beverley Fiedler, MD

## 2013-04-23 NOTE — Patient Instructions (Addendum)
Stop the Reglan medication. Stay on a Gastroparesis diet. Pamphlet  Provided. Your Endoscopy is scheduled with Dr. Erick Blinks on 04-27-2013 at 10 AM.

## 2013-04-24 ENCOUNTER — Ambulatory Visit: Payer: Self-pay | Admitting: Internal Medicine

## 2013-04-27 ENCOUNTER — Encounter: Payer: Self-pay | Admitting: Internal Medicine

## 2013-04-27 ENCOUNTER — Ambulatory Visit (AMBULATORY_SURGERY_CENTER): Payer: Medicaid Other | Admitting: Internal Medicine

## 2013-04-27 VITALS — BP 139/97 | HR 104 | Temp 97.7°F | Resp 11 | Ht 68.0 in | Wt 127.0 lb

## 2013-04-27 DIAGNOSIS — K2970 Gastritis, unspecified, without bleeding: Secondary | ICD-10-CM

## 2013-04-27 DIAGNOSIS — A048 Other specified bacterial intestinal infections: Secondary | ICD-10-CM

## 2013-04-27 DIAGNOSIS — K3184 Gastroparesis: Secondary | ICD-10-CM

## 2013-04-27 DIAGNOSIS — K2990 Gastroduodenitis, unspecified, without bleeding: Secondary | ICD-10-CM

## 2013-04-27 DIAGNOSIS — R112 Nausea with vomiting, unspecified: Secondary | ICD-10-CM

## 2013-04-27 DIAGNOSIS — R1013 Epigastric pain: Secondary | ICD-10-CM

## 2013-04-27 LAB — GLUCOSE, CAPILLARY
Glucose-Capillary: 263 mg/dL — ABNORMAL HIGH (ref 70–99)
Glucose-Capillary: 202 mg/dL — ABNORMAL HIGH (ref 70–99)

## 2013-04-27 MED ORDER — SODIUM CHLORIDE 0.9 % IV SOLN: 500.0000 mL | INTRAVENOUS | Status: DC

## 2013-04-27 NOTE — Progress Notes (Signed)
Called to room to assist during endoscopic procedure.  Patient ID and intended procedure confirmed with present staff. Received instructions for my participation in the procedure from the performing physician.  

## 2013-04-27 NOTE — Progress Notes (Signed)
Patient did not experience any of the following events: a burn prior to discharge; a fall within the facility; wrong site/side/patient/procedure/implant event; or a hospital transfer or hospital admission upon discharge from the facility. (G8907) Patient did not have preoperative order for IV antibiotic SSI prophylaxis. (G8918)  

## 2013-04-27 NOTE — Op Note (Signed)
[facility] [address] [place], [address]   ENDOSCOPY PROCEDURE REPORT  PATIENT: Jason [name], Jason [name]  MR#: [id number] BIRTHDATE: 07-Jun-1986 , 26  yrs. old GENDER: Male ENDOSCOPIST: Beverley Fiedler, MD REFERRED BY:  Gershon Crane, M.D. PROCEDURE DATE:  04/27/2013 PROCEDURE:  EGD w/ biopsy for H.pylori ASA CLASS:     Class III INDICATIONS:  Nausea.   Vomiting.   Epigastric pain. MEDICATIONS: MAC sedation, administered by CRNA, Propofol (Diprivan), Fentanyl-Quick Pick, and Propofol (Diprivan) 160 mg IV  TOPICAL ANESTHETIC: Cetacaine Spray  DESCRIPTION OF PROCEDURE: After the risks benefits and alternatives of the procedure were thoroughly explained, informed consent was obtained.  The LB [id number] endoscope was introduced through the mouth and advanced to the second portion of the duodenum. Without limitations.  The instrument was slowly withdrawn as the mucosa was fully examined.   ESOPHAGUS: The mucosa of the esophagus appeared normal.  STOMACH:  A moderate to large amount of food residue and fluid (too thick to lavage via the endoscope) was found in the cardia, gastric fundus, and proximal gastric body.   The mucosa of the distal stomach appeared normal.  Biopsies were taken in the antrum and angularis.  DUODENUM: The duodenal mucosa showed no abnormalities in the bulb and second portion of the duodenum. Retroflexed views were obscured by gastric contents, and thus the proximal stomach was incompletely examined.     The scope was then withdrawn from the patient and the procedure completed.  COMPLICATIONS: There were no complications. ENDOSCOPIC IMPRESSION: 1.   The mucosa of the esophagus appeared normal 2.   Food residue and fluid in the cardia, gastric fundus, and gastric body consistent with gastroparesis 3.   The mucosa of the distal stomach appeared normal; biopsies were taken in the antrum and angularis 4.   The duodenal mucosa showed no  abnormalities in the bulb and second portion of the duodenum  RECOMMENDATIONS: 1.  Await pathology results 2.  Gastroparesis diet 3.  Avoid narcotics as much as possible as narcotics will definitely worsen gastroparesis 4.  Strict glucose control will also improve gastroparesis 5.  Follow-up of helicobacter pylori status, treat if indicated eSigned:  Beverley Fiedler, MD 04/27/2013 10:32 AM CC:The Patient and Nelwyn Salisbury, MD

## 2013-04-27 NOTE — Patient Instructions (Addendum)
Discharge instructions given with verbal understanding. Biopsies taken. Resume previous medications. YOU HAD AN ENDOSCOPIC PROCEDURE TODAY AT THE  ENDOSCOPY CENTER: Refer to the procedure report that was given to you for any specific questions about what was found during the examination.  If the procedure report does not answer your questions, please call your gastroenterologist to clarify.  If you requested that your care partner not be given the details of your procedure findings, then the procedure report has been included in a sealed envelope for you to review at your convenience later.  YOU SHOULD EXPECT: Some feelings of bloating in the abdomen. Passage of more gas than usual.  Walking can help get rid of the air that was put into your GI tract during the procedure and reduce the bloating. If you had a lower endoscopy (such as a colonoscopy or flexible sigmoidoscopy) you may notice spotting of blood in your stool or on the toilet paper. If you underwent a bowel prep for your procedure, then you may not have a normal bowel movement for a few days.  DIET: Your first meal following the procedure should be a light meal and then it is ok to progress to your normal diet.  A half-sandwich or bowl of soup is an example of a good first meal.  Heavy or fried foods are harder to digest and may make you feel nauseous or bloated.  Likewise meals heavy in dairy and vegetables can cause extra gas to form and this can also increase the bloating.  Drink plenty of fluids but you should avoid alcoholic beverages for 24 hours.  ACTIVITY: Your care partner should take you home directly after the procedure.  You should plan to take it easy, moving slowly for the rest of the day.  You can resume normal activity the day after the procedure however you should NOT DRIVE or use heavy machinery for 24 hours (because of the sedation medicines used during the test).    SYMPTOMS TO REPORT IMMEDIATELY: A gastroenterologist  can be reached at any hour.  During normal business hours, 8:30 AM to 5:00 PM Monday through Friday, call [phone].  After hours and on weekends, please call the GI answering service at [phone] who will take a message and have the physician on call contact you.   Following upper endoscopy (EGD)  Vomiting of blood or coffee ground material  New chest pain or pain under the shoulder blades  Painful or persistently difficult swallowing  New shortness of breath  Fever of 100F or higher  Black, tarry-looking stools  FOLLOW UP: If any biopsies were taken you will be contacted by phone or by letter within the next 1-3 weeks.  Call your gastroenterologist if you have not heard about the biopsies in 3 weeks.  Our staff will call the home number listed on your records the next business day following your procedure to check on you and address any questions or concerns that you may have at that time regarding the information given to you following your procedure. This is a courtesy call and so if there is no answer at the home number and we have not heard from you through the emergency physician on call, we will assume that you have returned to your regular daily activities without incident.  SIGNATURES/CONFIDENTIALITY: You and/or your care partner have signed paperwork which will be entered into your electronic medical record.  These signatures attest to the fact that that the information above on your After   Visit Summary has been reviewed and is understood.  Full responsibility of the confidentiality of this discharge information lies with you and/or your care-partner. 

## 2013-04-27 NOTE — Progress Notes (Signed)
Procedure ends, to recovery awake, report given and VSS. 

## 2013-04-28 ENCOUNTER — Telehealth: Payer: Self-pay | Admitting: *Deleted

## 2013-04-28 NOTE — Telephone Encounter (Signed)
No answer, message left for the patient. 

## 2013-04-30 ENCOUNTER — Encounter: Payer: Self-pay | Admitting: Internal Medicine

## 2013-05-01 ENCOUNTER — Telehealth: Payer: Self-pay | Admitting: *Deleted

## 2013-05-01 MED ORDER — BIS SUBCIT-METRONID-TETRACYC 140-125-125 MG PO CAPS: 3.0000 | ORAL_CAPSULE | Freq: Three times a day (TID) | ORAL | Status: DC

## 2013-05-01 NOTE — Telephone Encounter (Signed)
Message copied by Florene Glen on Fri May 01, 2013  1:07 PM ------      Message from: Beverley Fiedler      Created: Thu Apr 30, 2013  5:32 PM       Gastric biopsies are positive for H. Pylori -- please treat with Pylera or PrevPak (he needs twice daily PPI during therapy).  It is very important that he complete the entire course of antibiotics and he should let us know if he cannot      This infection is in addition to his very probable gastroparesis, continue gastroparesis diet       ------

## 2013-05-01 NOTE — Telephone Encounter (Signed)
Informed pt he needs Pylera for his H. Pylori; he has no insurance so we will leave samples. He must take Nexium twice daily while taking Pylera and he needs to remain on the Gastroparesis diet

## 2013-05-21 ENCOUNTER — Emergency Department (HOSPITAL_BASED_OUTPATIENT_CLINIC_OR_DEPARTMENT_OTHER)
Admission: EM | Admit: 2013-05-21 | Discharge: 2013-05-21 | Disposition: A | Discharge status: Home / Self Care | Payer: Medicaid Other | Attending: Emergency Medicine | Admitting: Emergency Medicine

## 2013-05-21 DIAGNOSIS — R1110 Vomiting, unspecified: Secondary | ICD-10-CM | POA: Insufficient documentation

## 2013-05-21 DIAGNOSIS — Z880 Allergy status to penicillin: Secondary | ICD-10-CM | POA: Insufficient documentation

## 2013-05-21 DIAGNOSIS — G8929 Other chronic pain: Secondary | ICD-10-CM

## 2013-05-21 DIAGNOSIS — E1142 Type 2 diabetes mellitus with diabetic polyneuropathy: Secondary | ICD-10-CM | POA: Insufficient documentation

## 2013-05-21 DIAGNOSIS — G40909 Epilepsy, unspecified, not intractable, without status epilepticus: Secondary | ICD-10-CM | POA: Insufficient documentation

## 2013-05-21 DIAGNOSIS — K3184 Gastroparesis: Secondary | ICD-10-CM

## 2013-05-21 DIAGNOSIS — Z8614 Personal history of Methicillin resistant Staphylococcus aureus infection: Secondary | ICD-10-CM | POA: Insufficient documentation

## 2013-05-21 DIAGNOSIS — K219 Gastro-esophageal reflux disease without esophagitis: Secondary | ICD-10-CM | POA: Insufficient documentation

## 2013-05-21 DIAGNOSIS — Z79899 Other long term (current) drug therapy: Secondary | ICD-10-CM | POA: Insufficient documentation

## 2013-05-21 DIAGNOSIS — Z8719 Personal history of other diseases of the digestive system: Secondary | ICD-10-CM | POA: Insufficient documentation

## 2013-05-21 DIAGNOSIS — F1193 Opioid use, unspecified with withdrawal: Secondary | ICD-10-CM

## 2013-05-21 DIAGNOSIS — E1149 Type 2 diabetes mellitus with other diabetic neurological complication: Secondary | ICD-10-CM | POA: Insufficient documentation

## 2013-05-21 DIAGNOSIS — F17200 Nicotine dependence, unspecified, uncomplicated: Secondary | ICD-10-CM | POA: Insufficient documentation

## 2013-05-21 DIAGNOSIS — E119 Type 2 diabetes mellitus without complications: Secondary | ICD-10-CM | POA: Insufficient documentation

## 2013-05-21 DIAGNOSIS — F19939 Other psychoactive substance use, unspecified with withdrawal, unspecified: Secondary | ICD-10-CM | POA: Insufficient documentation

## 2013-05-21 DIAGNOSIS — F1123 Opioid dependence with withdrawal: Secondary | ICD-10-CM

## 2013-05-21 DIAGNOSIS — J45909 Unspecified asthma, uncomplicated: Secondary | ICD-10-CM | POA: Insufficient documentation

## 2013-05-21 DIAGNOSIS — Z794 Long term (current) use of insulin: Secondary | ICD-10-CM | POA: Insufficient documentation

## 2013-05-21 LAB — URINALYSIS, ROUTINE W REFLEX MICROSCOPIC
Ketones, ur: NEGATIVE mg/dL
Leukocytes, UA: NEGATIVE
Nitrite: NEGATIVE
Specific Gravity, Urine: 1.02 (ref 1.005–1.030)
Urobilinogen, UA: 0.2 mg/dL (ref 0.0–1.0)
pH: 7.5 (ref 5.0–8.0)

## 2013-05-21 LAB — CBC WITH DIFFERENTIAL/PLATELET
Basophils Absolute: 0.1 10*3/uL (ref 0.0–0.1)
Basophils Relative: 1 % (ref 0–1)
MCHC: 33.1 g/dL (ref 30.0–36.0)
Neutro Abs: 3.7 10*3/uL (ref 1.7–7.7)
Neutrophils Relative %: 42 % — ABNORMAL LOW (ref 43–77)
Platelets: 351 10*3/uL (ref 150–400)
RDW: 13.2 % (ref 11.5–15.5)

## 2013-05-21 LAB — BASIC METABOLIC PANEL
Chloride: 101 mEq/L (ref 96–112)
GFR calc Af Amer: >90 mL/min (ref >90)
Potassium: 4 mEq/L (ref 3.5–5.1)

## 2013-05-21 LAB — URINE MICROSCOPIC-ADD ON

## 2013-05-21 LAB — GLUCOSE, CAPILLARY

## 2013-05-21 MED ORDER — ONDANSETRON HCL 4 MG/2ML IJ SOLN
4.0000 mg | Freq: Once | INTRAMUSCULAR | Status: AC
  Administered 2013-05-21: 4 mg via INTRAVENOUS
  Filled 2013-05-21: qty 2

## 2013-05-21 MED ORDER — SODIUM CHLORIDE 0.9 % IV BOLUS (SEPSIS)
1000.0000 mL | Freq: Once | INTRAVENOUS | Status: AC
  Administered 2013-05-21: 1000 mL via INTRAVENOUS

## 2013-05-21 MED ORDER — HYDROMORPHONE HCL PF 2 MG/ML IJ SOLN
2.0000 mg | Freq: Once | INTRAMUSCULAR | Status: AC
  Administered 2013-05-21: 2 mg via INTRAVENOUS
  Filled 2013-05-21: qty 1

## 2013-05-21 NOTE — ED Notes (Signed)
Pt reports onset of abdominal pain, generalized body aches, vomiting x 1 this am and diarrhea several times this am.  States he ran out of Percocet and Oxycontin 2 days ago.

## 2013-05-21 NOTE — ED Provider Notes (Signed)
CSN: [id number]     Arrival date & time 05/21/13  0848 History     First MD Initiated Contact with Patient 05/21/13 [phone]     Chief Complaint  Patient presents with  . Abdominal Pain   (Consider location/radiation/quality/duration/timing/severity/associated sxs/prior Treatment) Patient is a 27 y.o. male presenting with abdominal pain.  Abdominal Pain  Pt well known to the ED with poorly controlled DM, gastroparesis, chronic abdominal pain with numerous ED visits here and [facility] for same has been doing well for the last months since last admission where he was discharged with Oxycontin Rx which he ran out of 2 days ago. He has had worsening diffuse abdominal pain, loose stools since then. He vomited once this morning. Denies fever. States sugar has been well controlled. He has been seeing GI who was giving him Reglan but stopped due to leg stiffness. He has been taking Phenergan at home with good control of vomiting.   Past Medical History  Diagnosis Date  . Diabetes mellitus   . GERD (gastroesophageal reflux disease)   . Asthma   . Hx MRSA infection     on face  . Gastroparesis   . Diabetic neuropathy   . Seizures    Past Surgical History  Procedure Laterality Date  . Tonsillectomy     Family History  Problem Relation Age of Onset  . Diabetes Father   . Hypertension Father   . Asthma      fhx  . Hypertension      fhx  . Stroke      fhx  . Heart disease Mother    History  Substance Use Topics  . Smoking status: Current Every Day Smoker    Types: Cigars  . Smokeless tobacco: Never Used     Comment: 2 per day E-HOOKA  . Alcohol Use: Yes     Comment: Rarely    Review of Systems  Gastrointestinal: Positive for abdominal pain.   All other systems reviewed and are negative except as noted in HPI.   Allergies  Cefuroxime axetil; Penicillins; Tessalon; and Shellfish allergy  Home Medications   Current Outpatient Rx  Name  Route  Sig  Dispense  Refill  .  oxyCODONE-acetaminophen (PERCOCET) 10-325 MG per tablet   Oral   Take 1 tablet by mouth every 4 (four) hours as needed for pain.         Marland Kitchen esomeprazole (NEXIUM) 20 MG capsule   Oral   Take 20 mg by mouth daily before breakfast.         . insulin aspart (NOVOLOG) 100 UNIT/ML injection   Subcutaneous   Inject 1-17 Units into the skin 4 (four) times daily. Per sliding scale         . insulin glargine (LANTUS) 100 UNIT/ML injection   Subcutaneous   Inject 32 Units into the skin at bedtime.         . metoCLOPramide (REGLAN) 10 MG tablet   Oral   Take 10 mg by mouth 3 (three) times daily.         [name] Bicarbonate (ZEGERID OTC) 20-1100 MG CAPS   Oral   Take 1 capsule by mouth at bedtime.          . OxyCODONE (OXYCONTIN) 15 mg T12A   Oral   Take 15 mg by mouth every 12 (twelve) hours.         . pregabalin (LYRICA) 100 MG capsule   Oral   Take 100 mg by  mouth 3 (three) times daily.         . promethazine (PHENERGAN) 25 MG suppository   Rectal   Place 25 mg rectally every 6 (six) hours as needed for nausea.         Marland Kitchen zolpidem (AMBIEN) 10 MG tablet   Oral   Take 10 mg by mouth at bedtime as needed for sleep.          BP 137/87  Pulse 99  Temp(Src) 98.2 F (36.8 C) (Oral)  Resp 16  Ht 5\' 8"  (1.727 m)  Wt 127 lb (57.607 kg)  BMI 19.31 kg/m2  SpO2 98% Physical Exam  Nursing note and vitals reviewed. Constitutional: He is oriented to person, place, and time. He appears well-developed and well-nourished.  HENT:  Head: Normocephalic and atraumatic.  Eyes: EOM are normal. Pupils are equal, round, and reactive to light.  Neck: Normal range of motion. Neck supple.  Cardiovascular: Normal rate, normal heart sounds and intact distal pulses.   Pulmonary/Chest: Effort normal and breath sounds normal.  Abdominal: Bowel sounds are normal. He exhibits no distension. There is tenderness (diffuse). There is no rebound and no guarding.   Musculoskeletal: Normal range of motion. He exhibits no edema and no tenderness.  Neurological: He is alert and oriented to person, place, and time. He has normal strength. No cranial nerve deficit or sensory deficit.  Skin: Skin is warm and dry. No rash noted.  Psychiatric: He has a normal mood and affect.    ED Course   Procedures (including critical care time)  Labs Reviewed  CBC WITH DIFFERENTIAL - Abnormal; Notable for the following:    RBC 4.01 (*)    Hemoglobin 10.9 (*)    HCT 32.9 (*)    Neutrophils Relative % 42 (*)    Monocytes Absolute 1.1 (*)    All other components within normal limits  BASIC METABOLIC PANEL - Abnormal; Notable for the following:    Glucose, Bld 241 (*)    All other components within normal limits  URINALYSIS, ROUTINE W REFLEX MICROSCOPIC - Abnormal; Notable for the following:    Glucose, UA >1000 (*)    Hgb urine dipstick MODERATE (*)    Protein, ur 30 (*)    All other components within normal limits  GLUCOSE, CAPILLARY - Abnormal; Notable for the following:    Glucose-Capillary 216 (*)    All other components within normal limits  URINE MICROSCOPIC-ADD ON - Abnormal; Notable for the following:    Squamous Epithelial / LPF FEW (*)    All other components within normal limits   No results found. 1. Gastroparesis   2. Chronic pain   3. Narcotic withdrawal     MDM  Pt with gastroparesis, abdominal pain likely exacerbated by narcotic withdrawal. Will check labs and give IVF as well as pain and nausea meds.   Labs reviewed, no DKA. No vomiting in the ED. Pt advised to discuss long term pain control with his PCP or pain management. He has several poorly healing ulcers on L foot and shin which do not appear to be actively draining today and he is scheduled for wound care followup soon. HE ws advised to use topical Abx in the meantime. Advised to avoid narcotic medications altogether if possible and continue phenergan at home.   Charles B. Bernette Mayers,  MD 05/21/13 1100

## 2013-05-26 ENCOUNTER — Encounter: Payer: Self-pay | Admitting: Family Medicine

## 2013-05-29 ENCOUNTER — Ambulatory Visit: Payer: Self-pay | Admitting: Family Medicine

## 2013-06-01 ENCOUNTER — Encounter (HOSPITAL_BASED_OUTPATIENT_CLINIC_OR_DEPARTMENT_OTHER): Payer: Medicaid Other | Attending: General Surgery

## 2013-06-01 DIAGNOSIS — L97209 Non-pressure chronic ulcer of unspecified calf with unspecified severity: Secondary | ICD-10-CM | POA: Insufficient documentation

## 2013-06-01 DIAGNOSIS — G609 Hereditary and idiopathic neuropathy, unspecified: Secondary | ICD-10-CM | POA: Insufficient documentation

## 2013-06-01 DIAGNOSIS — E1069 Type 1 diabetes mellitus with other specified complication: Secondary | ICD-10-CM | POA: Insufficient documentation

## 2013-06-01 DIAGNOSIS — Z794 Long term (current) use of insulin: Secondary | ICD-10-CM | POA: Insufficient documentation

## 2013-06-02 ENCOUNTER — Encounter: Payer: Self-pay | Admitting: Family Medicine

## 2013-06-02 ENCOUNTER — Ambulatory Visit (INDEPENDENT_AMBULATORY_CARE_PROVIDER_SITE_OTHER): Payer: Medicaid Other | Admitting: Family Medicine

## 2013-06-02 VITALS — BP 120/70 | HR 112 | Temp 98.6°F | Wt 121.0 lb

## 2013-06-02 DIAGNOSIS — L0390 Cellulitis, unspecified: Secondary | ICD-10-CM

## 2013-06-02 DIAGNOSIS — E1049 Type 1 diabetes mellitus with other diabetic neurological complication: Secondary | ICD-10-CM

## 2013-06-02 DIAGNOSIS — E1140 Type 2 diabetes mellitus with diabetic neuropathy, unspecified: Secondary | ICD-10-CM

## 2013-06-02 DIAGNOSIS — E1149 Type 2 diabetes mellitus with other diabetic neurological complication: Secondary | ICD-10-CM

## 2013-06-02 DIAGNOSIS — E1143 Type 2 diabetes mellitus with diabetic autonomic (poly)neuropathy: Secondary | ICD-10-CM

## 2013-06-02 DIAGNOSIS — R112 Nausea with vomiting, unspecified: Secondary | ICD-10-CM

## 2013-06-02 DIAGNOSIS — L0291 Cutaneous abscess, unspecified: Secondary | ICD-10-CM

## 2013-06-02 DIAGNOSIS — E1040 Type 1 diabetes mellitus with diabetic neuropathy, unspecified: Secondary | ICD-10-CM

## 2013-06-02 DIAGNOSIS — K3184 Gastroparesis: Secondary | ICD-10-CM

## 2013-06-02 DIAGNOSIS — E1142 Type 2 diabetes mellitus with diabetic polyneuropathy: Secondary | ICD-10-CM

## 2013-06-02 LAB — GLUCOSE, CAPILLARY: Glucose-Capillary: >600 mg/dL (ref 70–99)

## 2013-06-02 MED ORDER — PROMETHAZINE HCL 25 MG RE SUPP: 25.0000 mg | Freq: Four times a day (QID) | RECTAL | Status: DC | PRN

## 2013-06-02 MED ORDER — PROMETHAZINE HCL 25 MG PO TABS: 25.0000 mg | ORAL_TABLET | Freq: Four times a day (QID) | ORAL | Status: DC | PRN

## 2013-06-02 NOTE — Progress Notes (Signed)
[facility]  NAME:  Jason [name], Jason [name]             ACCOUNT NO.:  [id number]  MEDICAL RECORD NO.:  [id number]      DATE OF BIRTH:  11-Jul-1986  PHYSICIAN:  Ardath Sax, M.D.           VISIT DATE:                                  OFFICE VISIT   This is a 27 year old African American male who has type 1 diabetes and he is not handling that very well.  He says he has had blood sugars in the 500 and 600 recently, although today, he said it was 90.  He is a very thin individual.  He only weighs 127 pounds, he is 5 feet and 8 inches, his blood pressure is 120/80, respirations 19, pulse 102, temperature 98.2.  He has been a diabetic for about 14 years.  He takes NovoLog insulin and Lantus insulin, and he also takes Lyrica, Reglan and Phenergan presumably because of gastroparesis.  He states he has peripheral neuropathy and he does smoke.  I looked at his diabetic ulcers, which were fairly small, but they were on the upper part of his calves, they were about 1 cm or so.  There was several of amount each calf.  I debrided them and cleansed them and we are treating him with silver alginate.  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  He will treat these at home with silver alginate and he will come back here next week for further treatment and evaluation.     Ardath Sax, M.D.     PP/MEDQ  D:  06/01/2013  T:  06/02/2013  Job:  [id number]

## 2013-06-02 NOTE — Progress Notes (Signed)
  Subjective:    Patient ID: Jason [name], male    DOB: 21-Apr-1986, 27 y.o.   MRN: [id number]  HPI Here to follow up an ER visit on 05-31-13 for gastroparesis. He cannot tolerate Reglan due to leg stiffness so he uses Phenergan tablets and suppositories for nausea. He can drink fluids but he has a hard time getting any solid food down. He is losing weight slowly. He has been using narcotics to control the neuropathic pain he has in the legs and feet, but this is not very successful. His diabetes is wildly labile going from lows in the 40s and 50s to highs in the 600s. He has been referred to several endocrinologists in town but he cannot see them because he cannot afford them. He either owes them all money or they require high fees up front. He often runs out of medications and insulin. He tries to manage his diet the best he can. He has been to the ER numerous times in the past year for various issues, most of which are related to his poorly controlled diabetes. He had an EGD in July per Dr. Rhea Belton and this showed severe gastroparesis. No ulcers. He did have H.pylori and this was treated with antibiotics.    Review of Systems  Constitutional: Positive for appetite change, fatigue and unexpected weight change. Negative for fever.  Respiratory: Negative.   Cardiovascular: Negative.   Gastrointestinal: Positive for nausea. Negative for vomiting, abdominal pain, diarrhea, constipation, blood in stool, abdominal distention, anal bleeding and rectal pain.       Objective:   Physical Exam  Constitutional: He appears well-developed and well-nourished.  Very thin   Cardiovascular: Normal rate, regular rhythm, normal heart sounds and intact distal pulses.   Pulmonary/Chest: Effort normal and breath sounds normal.  Abdominal: Soft. Bowel sounds are normal. He exhibits no distension and no mass. There is no tenderness. There is no rebound and no guarding.          Assessment & Plan:  For the  diabetes, we gave him some samples of Novolog insulin and a new glucometer. We refilled Phenergan tabs and suppositories to use for nausea. He has been dressing some leg ulcers with silver alginate [facility]. I gave him contact information about the [facility], which might open some doors for him to get adequate treatment. He has applied for Medicaid but this is probably months away from being approved.

## 2013-06-11 ENCOUNTER — Encounter (HOSPITAL_BASED_OUTPATIENT_CLINIC_OR_DEPARTMENT_OTHER): Payer: Medicaid Other | Attending: General Surgery

## 2013-06-11 DIAGNOSIS — Z794 Long term (current) use of insulin: Secondary | ICD-10-CM | POA: Insufficient documentation

## 2013-06-11 DIAGNOSIS — L97809 Non-pressure chronic ulcer of other part of unspecified lower leg with unspecified severity: Secondary | ICD-10-CM | POA: Insufficient documentation

## 2013-06-11 DIAGNOSIS — E1069 Type 1 diabetes mellitus with other specified complication: Secondary | ICD-10-CM | POA: Diagnosis present

## 2013-06-11 LAB — GLUCOSE, CAPILLARY: Glucose-Capillary: 288 mg/dL — ABNORMAL HIGH (ref 70–99)

## 2013-06-26 ENCOUNTER — Encounter (HOSPITAL_BASED_OUTPATIENT_CLINIC_OR_DEPARTMENT_OTHER): Payer: Self-pay

## 2013-06-26 ENCOUNTER — Emergency Department (HOSPITAL_BASED_OUTPATIENT_CLINIC_OR_DEPARTMENT_OTHER)
Admission: EM | Admit: 2013-06-26 | Discharge: 2013-06-26 | Disposition: A | Discharge status: Home / Self Care | Payer: Medicaid Other | Attending: Emergency Medicine | Admitting: Emergency Medicine

## 2013-06-26 DIAGNOSIS — F1110 Opioid abuse, uncomplicated: Secondary | ICD-10-CM | POA: Insufficient documentation

## 2013-06-26 DIAGNOSIS — Z794 Long term (current) use of insulin: Secondary | ICD-10-CM | POA: Insufficient documentation

## 2013-06-26 DIAGNOSIS — F17200 Nicotine dependence, unspecified, uncomplicated: Secondary | ICD-10-CM | POA: Insufficient documentation

## 2013-06-26 DIAGNOSIS — J45909 Unspecified asthma, uncomplicated: Secondary | ICD-10-CM | POA: Insufficient documentation

## 2013-06-26 DIAGNOSIS — R52 Pain, unspecified: Secondary | ICD-10-CM | POA: Insufficient documentation

## 2013-06-26 DIAGNOSIS — E1142 Type 2 diabetes mellitus with diabetic polyneuropathy: Secondary | ICD-10-CM | POA: Insufficient documentation

## 2013-06-26 DIAGNOSIS — Z8614 Personal history of Methicillin resistant Staphylococcus aureus infection: Secondary | ICD-10-CM | POA: Insufficient documentation

## 2013-06-26 DIAGNOSIS — Z79899 Other long term (current) drug therapy: Secondary | ICD-10-CM | POA: Insufficient documentation

## 2013-06-26 DIAGNOSIS — E1149 Type 2 diabetes mellitus with other diabetic neurological complication: Secondary | ICD-10-CM | POA: Insufficient documentation

## 2013-06-26 DIAGNOSIS — G40909 Epilepsy, unspecified, not intractable, without status epilepticus: Secondary | ICD-10-CM | POA: Insufficient documentation

## 2013-06-26 DIAGNOSIS — Z880 Allergy status to penicillin: Secondary | ICD-10-CM | POA: Insufficient documentation

## 2013-06-26 DIAGNOSIS — K219 Gastro-esophageal reflux disease without esophagitis: Secondary | ICD-10-CM | POA: Insufficient documentation

## 2013-06-26 DIAGNOSIS — R1110 Vomiting, unspecified: Secondary | ICD-10-CM | POA: Insufficient documentation

## 2013-06-26 LAB — CBC WITH DIFFERENTIAL/PLATELET
Basophils Absolute: 0.1 10*3/uL (ref 0.0–0.1)
Basophils Relative: 1 % (ref 0–1)
Eosinophils Absolute: 0.1 10*3/uL (ref 0.0–0.7)
Eosinophils Relative: 2 % (ref 0–5)
Lymphs Abs: 2.5 10*3/uL (ref 0.7–4.0)
MCH: 26.9 pg (ref 26.0–34.0)
MCHC: 33.6 g/dL (ref 30.0–36.0)
MCV: 80 fL (ref 78.0–100.0)
Platelets: 258 10*3/uL (ref 150–400)
RDW: 12.5 % (ref 11.5–15.5)

## 2013-06-26 LAB — URINALYSIS, ROUTINE W REFLEX MICROSCOPIC
Ketones, ur: 40 mg/dL — AB
Leukocytes, UA: NEGATIVE
Nitrite: NEGATIVE
Protein, ur: NEGATIVE mg/dL
Urobilinogen, UA: 0.2 mg/dL (ref 0.0–1.0)

## 2013-06-26 LAB — COMPREHENSIVE METABOLIC PANEL
Alkaline Phosphatase: 106 U/L (ref 39–117)
BUN: 7 mg/dL (ref 6–23)
Chloride: 97 mEq/L (ref 96–112)
GFR calc Af Amer: >90 mL/min (ref >90)
Glucose, Bld: 488 mg/dL — ABNORMAL HIGH (ref 70–99)
Potassium: 3.9 mEq/L (ref 3.5–5.1)
Total Bilirubin: 0.9 mg/dL (ref 0.3–1.2)
Total Protein: 7.7 g/dL (ref 6.0–8.3)

## 2013-06-26 LAB — RAPID URINE DRUG SCREEN, HOSP PERFORMED
Amphetamines: NOT DETECTED
Opiates: POSITIVE — AB

## 2013-06-26 MED ORDER — ONDANSETRON HCL 4 MG/2ML IJ SOLN
4.0000 mg | Freq: Once | INTRAMUSCULAR | Status: AC
  Administered 2013-06-26: 4 mg via INTRAVENOUS
  Filled 2013-06-26: qty 2

## 2013-06-26 MED ORDER — ONDANSETRON HCL 4 MG/2ML IJ SOLN
4.0000 mg | Freq: Once | INTRAMUSCULAR | Status: DC
  Filled 2013-06-26: qty 2

## 2013-06-26 MED ORDER — HYDROMORPHONE HCL PF 1 MG/ML IJ SOLN
INTRAMUSCULAR | Status: AC
  Administered 2013-06-26: 1 mg via INTRAVENOUS
  Filled 2013-06-26: qty 1

## 2013-06-26 MED ORDER — SODIUM CHLORIDE 0.9 % IV BOLUS (SEPSIS)
1000.0000 mL | Freq: Once | INTRAVENOUS | Status: AC
  Administered 2013-06-26: 1000 mL via INTRAVENOUS

## 2013-06-26 MED ORDER — PROMETHAZINE HCL 25 MG RE SUPP: 25.0000 mg | Freq: Four times a day (QID) | RECTAL | Status: DC | PRN

## 2013-06-26 MED ORDER — LORAZEPAM 2 MG/ML IJ SOLN
1.0000 mg | Freq: Once | INTRAMUSCULAR | Status: AC
  Administered 2013-06-26: 1 mg via INTRAVENOUS
  Filled 2013-06-26: qty 1

## 2013-06-26 MED ORDER — DIPHENHYDRAMINE HCL 50 MG/ML IJ SOLN
25.0000 mg | Freq: Once | INTRAMUSCULAR | Status: AC
  Administered 2013-06-26: 25 mg via INTRAVENOUS
  Filled 2013-06-26: qty 1

## 2013-06-26 MED ORDER — HYDROMORPHONE HCL PF 1 MG/ML IJ SOLN
1.0000 mg | Freq: Once | INTRAMUSCULAR | Status: AC
  Administered 2013-06-26: 1 mg via INTRAVENOUS

## 2013-06-26 MED ORDER — PROMETHAZINE HCL 25 MG/ML IJ SOLN
25.0000 mg | Freq: Once | INTRAMUSCULAR | Status: AC
  Administered 2013-06-26: 25 mg via INTRAVENOUS
  Filled 2013-06-26: qty 1

## 2013-06-26 MED ORDER — ONDANSETRON HCL 4 MG/2ML IJ SOLN
4.0000 mg | Freq: Once | INTRAMUSCULAR | Status: AC
  Administered 2013-06-26: 4 mg via INTRAVENOUS

## 2013-06-26 MED ORDER — PROMETHAZINE HCL 25 MG PO TABS: 25.0000 mg | ORAL_TABLET | Freq: Four times a day (QID) | ORAL | Status: DC | PRN

## 2013-06-26 NOTE — ED Notes (Signed)
PA at bedside giving results and plan of care for dispo.

## 2013-06-26 NOTE — ED Notes (Signed)
Pt reports vomiting that started this am, generalized pain x 3 days and hyperglycemia this am.

## 2013-06-26 NOTE — ED Notes (Signed)
Percocet pill found by pts pants. Pt had stated that he was out of Percocet.

## 2013-06-26 NOTE — ED Notes (Signed)
Pt vomiting again.  Also requesting pain med.  PA notified.  Order received.  Pt also notified that if he is not able to obtain urine specimen we will need to catheterize him.  Pt voiced understanding and sts he will try.

## 2013-06-26 NOTE — ED Provider Notes (Signed)
CSN: [id number]     Arrival date & time 06/26/13  1218 History   First MD Initiated Contact with Patient 06/26/13 1308     Chief Complaint  Patient presents with  . Emesis  . Generalized Body Aches  . Hyperglycemia   (Consider location/radiation/quality/duration/timing/severity/associated sxs/prior Treatment) Patient is a 27 y.o. male presenting with vomiting. The history is provided by the patient. No language interpreter was used.  Emesis Severity:  Severe Duration:  3 days Timing:  Constant Number of daily episodes:  Multiple Progression:  Worsening Chronicity:  Recurrent Relieved by:  Nothing Worsened by:  Nothing tried Ineffective treatments:  None tried Associated symptoms: abdominal pain   Risk factors: diabetes     Past Medical History  Diagnosis Date  . Diabetes mellitus   . GERD (gastroesophageal reflux disease)   . Asthma   . Hx MRSA infection     on face  . Gastroparesis   . Diabetic neuropathy   . Seizures    Past Surgical History  Procedure Laterality Date  . Tonsillectomy     Family History  Problem Relation Age of Onset  . Diabetes Father   . Hypertension Father   . Asthma      fhx  . Hypertension      fhx  . Stroke      fhx  . Heart disease Mother    History  Substance Use Topics  . Smoking status: Current Some Day Smoker    Types: Cigars  . Smokeless tobacco: Never Used     Comment: e-cigarette  . Alcohol Use: Yes     Comment: Rarely    Review of Systems  Gastrointestinal: Positive for nausea, vomiting and abdominal pain.  All other systems reviewed and are negative.    Allergies  Cefuroxime axetil; Penicillins; Tessalon; and Shellfish allergy  Home Medications   Current Outpatient Rx  Name  Route  Sig  Dispense  Refill  . oxyCODONE-acetaminophen (PERCOCET) 10-325 MG per tablet   Oral   Take 1 tablet by mouth every 4 (four) hours as needed for pain.         . pregabalin (LYRICA) 100 MG capsule   Oral   Take 100 mg by  mouth 3 (three) times daily.         Marland Kitchen zolpidem (AMBIEN) 10 MG tablet   Oral   Take 10 mg by mouth at bedtime as needed for sleep.         Marland Kitchen esomeprazole (NEXIUM) 20 MG capsule   Oral   Take 20 mg by mouth daily before breakfast.         . insulin aspart (NOVOLOG) 100 UNIT/ML injection   Subcutaneous   Inject 1-17 Units into the skin 4 (four) times daily. Per sliding scale         . insulin glargine (LANTUS) 100 UNIT/ML injection   Subcutaneous   Inject 32 Units into the skin at bedtime.         . metoCLOPramide (REGLAN) 10 MG tablet   Oral   Take 10 mg by mouth 3 (three) times daily.         [name] Bicarbonate (ZEGERID OTC) 20-1100 MG CAPS   Oral   Take 1 capsule by mouth at bedtime.          . OxyCODONE (OXYCONTIN) 15 mg T12A   Oral   Take 15 mg by mouth every 12 (twelve) hours.         . promethazine (  PHENERGAN) 25 MG suppository   Rectal   Place 1 suppository (25 mg total) rectally every 6 (six) hours as needed for nausea.   30 each   11   . promethazine (PHENERGAN) 25 MG tablet   Oral   Take 1 tablet (25 mg total) by mouth every 6 (six) hours as needed for nausea.   60 tablet   11    BP 149/88  Pulse 115  Temp(Src) 99 F (37.2 C) (Oral)  Resp 16  Ht 5\' 8"  (1.727 m)  Wt 127 lb (57.607 kg)  BMI 19.31 kg/m2  SpO2 95% Physical Exam  Nursing note and vitals reviewed. Constitutional: He is oriented to person, place, and time. He appears well-developed and well-nourished.  HENT:  Head: Normocephalic.  Mouth/Throat: Oropharynx is clear and moist.  Eyes: Conjunctivae and EOM are normal. Pupils are equal, round, and reactive to light.  Neck: Normal range of motion.  Cardiovascular: Normal rate and regular rhythm.   Pulmonary/Chest: Effort normal.  Abdominal: Soft. He exhibits no distension.  Musculoskeletal: Normal range of motion.  Neurological: He is alert and oriented to person, place, and time.  Skin: Skin is warm.   Psychiatric: He has a normal mood and affect.    ED Course  Procedures (including critical care time) Labs Review Labs Reviewed  GLUCOSE, CAPILLARY - Abnormal; Notable for the following:    Glucose-Capillary 448 (*)    All other components within normal limits  CBC WITH DIFFERENTIAL - Abnormal; Notable for the following:    Hemoglobin 12.2 (*)    HCT 36.3 (*)    All other components within normal limits  COMPREHENSIVE METABOLIC PANEL - Abnormal; Notable for the following:    Glucose, Bld 488 (*)    All other components within normal limits  URINE RAPID DRUG SCREEN (HOSP PERFORMED)   Imaging Review No results found. Results for orders placed during the hospital encounter of 06/26/13  GLUCOSE, CAPILLARY      Result Value Range   Glucose-Capillary 448 (*) 70 - 99 mg/dL  CBC WITH DIFFERENTIAL      Result Value Range   WBC 7.1  4.0 - 10.5 K/uL   RBC 4.54  4.22 - 5.81 MIL/uL   Hemoglobin 12.2 (*) 13.0 - 17.0 g/dL   HCT 45.4 (*) 09.8 - 11.9 %   MCV 80.0  78.0 - 100.0 fL   MCH 26.9  26.0 - 34.0 pg   MCHC 33.6  30.0 - 36.0 g/dL   RDW 14.7  82.9 - 56.2 %   Platelets 258  150 - 400 K/uL   Neutrophils Relative % 56  43 - 77 %   Neutro Abs 3.9  1.7 - 7.7 K/uL   Lymphocytes Relative 35  12 - 46 %   Lymphs Abs 2.5  0.7 - 4.0 K/uL   Monocytes Relative 7  3 - 12 %   Monocytes Absolute 0.5  0.1 - 1.0 K/uL   Eosinophils Relative 2  0 - 5 %   Eosinophils Absolute 0.1  0.0 - 0.7 K/uL   Basophils Relative 1  0 - 1 %   Basophils Absolute 0.1  0.0 - 0.1 K/uL  URINE RAPID DRUG SCREEN (HOSP PERFORMED)      Result Value Range   Opiates POSITIVE (*) NONE DETECTED   Cocaine NONE DETECTED  NONE DETECTED   Benzodiazepines NONE DETECTED  NONE DETECTED   Amphetamines NONE DETECTED  NONE DETECTED   Tetrahydrocannabinol NONE DETECTED  NONE DETECTED  Barbiturates NONE DETECTED  NONE DETECTED  COMPREHENSIVE METABOLIC PANEL      Result Value Range   Sodium 139  135 - 145 mEq/L   Potassium 3.9   3.5 - 5.1 mEq/L   Chloride 97  96 - 112 mEq/L   CO2 28  19 - 32 mEq/L   Glucose, Bld 488 (*) 70 - 99 mg/dL   BUN 7  6 - 23 mg/dL   Creatinine, Ser 1.61  0.50 - 1.35 mg/dL   Calcium 09.6  8.4 - 04.5 mg/dL   Total Protein 7.7  6.0 - 8.3 g/dL   Albumin 3.8  3.5 - 5.2 g/dL   AST 23  0 - 37 U/L   ALT 15  0 - 53 U/L   Alkaline Phosphatase 106  39 - 117 U/L   Total Bilirubin 0.9  0.3 - 1.2 mg/dL   GFR calc non Af Amer >90  >90 mL/min   GFR calc Af Amer >90  >90 mL/min  URINALYSIS, ROUTINE W REFLEX MICROSCOPIC      Result Value Range   Color, Urine YELLOW  YELLOW   APPearance CLEAR  CLEAR   Specific Gravity, Urine 1.023  1.005 - 1.030   pH 7.0  5.0 - 8.0   Glucose, UA >1000 (*) NEGATIVE mg/dL   Hgb urine dipstick MODERATE (*) NEGATIVE   Bilirubin Urine NEGATIVE  NEGATIVE   Ketones, ur 40 (*) NEGATIVE mg/dL   Protein, ur NEGATIVE  NEGATIVE mg/dL   Urobilinogen, UA 0.2  0.0 - 1.0 mg/dL   Nitrite NEGATIVE  NEGATIVE   Leukocytes, UA NEGATIVE  NEGATIVE  URINE MICROSCOPIC-ADD ON      Result Value Range   Squamous Epithelial / LPF FEW (*) RARE   RBC / HPF 7-10  <3 RBC/hpf   Bacteria, UA RARE  RARE   Urine-Other SPERM PRESENT    GLUCOSE, CAPILLARY      Result Value Range   Glucose-Capillary 379 (*) 70 - 99 mg/dL   No results found.  MDM   1. Vomiting    Pt given iv fluids x 2 liters,  Multiple nausea medications.   Pt reports feeling better,  vomiting stopped. Pt not in DKA.  Glucose dropped with IV fluids,   Pt advised to resume home medications.    Pt advised to return if any problems.  Pt given rx for phenergan tablets and suppositories    Elson Areas, PA-C 06/26/13 2224

## 2013-06-29 ENCOUNTER — Emergency Department (HOSPITAL_BASED_OUTPATIENT_CLINIC_OR_DEPARTMENT_OTHER)
Admission: EM | Admit: 2013-06-29 | Discharge: 2013-06-29 | Disposition: A | Discharge status: Home / Self Care | Payer: Medicaid Other | Source: Home / Self Care | Attending: Emergency Medicine | Admitting: Emergency Medicine

## 2013-06-29 ENCOUNTER — Telehealth: Payer: Self-pay | Admitting: *Deleted

## 2013-06-29 ENCOUNTER — Encounter (HOSPITAL_BASED_OUTPATIENT_CLINIC_OR_DEPARTMENT_OTHER): Payer: Self-pay | Admitting: *Deleted

## 2013-06-29 DIAGNOSIS — R112 Nausea with vomiting, unspecified: Secondary | ICD-10-CM | POA: Insufficient documentation

## 2013-06-29 DIAGNOSIS — Z880 Allergy status to penicillin: Secondary | ICD-10-CM | POA: Insufficient documentation

## 2013-06-29 DIAGNOSIS — E876 Hypokalemia: Secondary | ICD-10-CM | POA: Insufficient documentation

## 2013-06-29 DIAGNOSIS — G8929 Other chronic pain: Secondary | ICD-10-CM | POA: Insufficient documentation

## 2013-06-29 DIAGNOSIS — R109 Unspecified abdominal pain: Secondary | ICD-10-CM | POA: Insufficient documentation

## 2013-06-29 DIAGNOSIS — Z8614 Personal history of Methicillin resistant Staphylococcus aureus infection: Secondary | ICD-10-CM | POA: Insufficient documentation

## 2013-06-29 DIAGNOSIS — J45909 Unspecified asthma, uncomplicated: Secondary | ICD-10-CM | POA: Insufficient documentation

## 2013-06-29 DIAGNOSIS — R1110 Vomiting, unspecified: Secondary | ICD-10-CM

## 2013-06-29 DIAGNOSIS — Z794 Long term (current) use of insulin: Secondary | ICD-10-CM | POA: Insufficient documentation

## 2013-06-29 DIAGNOSIS — E1142 Type 2 diabetes mellitus with diabetic polyneuropathy: Secondary | ICD-10-CM | POA: Insufficient documentation

## 2013-06-29 DIAGNOSIS — G40909 Epilepsy, unspecified, not intractable, without status epilepticus: Secondary | ICD-10-CM | POA: Insufficient documentation

## 2013-06-29 DIAGNOSIS — K219 Gastro-esophageal reflux disease without esophagitis: Secondary | ICD-10-CM | POA: Insufficient documentation

## 2013-06-29 DIAGNOSIS — Z79899 Other long term (current) drug therapy: Secondary | ICD-10-CM | POA: Insufficient documentation

## 2013-06-29 DIAGNOSIS — E1149 Type 2 diabetes mellitus with other diabetic neurological complication: Secondary | ICD-10-CM | POA: Insufficient documentation

## 2013-06-29 DIAGNOSIS — F17200 Nicotine dependence, unspecified, uncomplicated: Secondary | ICD-10-CM | POA: Insufficient documentation

## 2013-06-29 LAB — URINALYSIS, ROUTINE W REFLEX MICROSCOPIC
Bilirubin Urine: NEGATIVE
Ketones, ur: 40 mg/dL — AB
Protein, ur: 30 mg/dL — AB
Urobilinogen, UA: 0.2 mg/dL (ref 0.0–1.0)

## 2013-06-29 LAB — CBC WITH DIFFERENTIAL/PLATELET
Eosinophils Relative: 1 % (ref 0–5)
HCT: 33 % — ABNORMAL LOW (ref 39.0–52.0)
Lymphocytes Relative: 31 % (ref 12–46)
Lymphs Abs: 3.7 10*3/uL (ref 0.7–4.0)
MCV: 77.5 fL — ABNORMAL LOW (ref 78.0–100.0)
Monocytes Absolute: 1.4 10*3/uL — ABNORMAL HIGH (ref 0.1–1.0)
RBC: 4.26 MIL/uL (ref 4.22–5.81)
WBC: 12 10*3/uL — ABNORMAL HIGH (ref 4.0–10.5)

## 2013-06-29 LAB — COMPREHENSIVE METABOLIC PANEL
ALT: 9 U/L (ref 0–53)
CO2: 26 mEq/L (ref 19–32)
Calcium: 9.7 mg/dL (ref 8.4–10.5)
Creatinine, Ser: 0.8 mg/dL (ref 0.50–1.35)
GFR calc Af Amer: >90 mL/min (ref >90)
GFR calc non Af Amer: >90 mL/min (ref >90)
Glucose, Bld: 48 mg/dL — ABNORMAL LOW (ref 70–99)
Sodium: 137 mEq/L (ref 135–145)
Total Bilirubin: 1.3 mg/dL — ABNORMAL HIGH (ref 0.3–1.2)

## 2013-06-29 LAB — GLUCOSE, CAPILLARY
Glucose-Capillary: 104 mg/dL — ABNORMAL HIGH (ref 70–99)
Glucose-Capillary: 97 mg/dL (ref 70–99)

## 2013-06-29 LAB — URINE MICROSCOPIC-ADD ON

## 2013-06-29 MED ORDER — HYDROMORPHONE HCL PF 1 MG/ML IJ SOLN
1.0000 mg | Freq: Once | INTRAMUSCULAR | Status: AC
  Administered 2013-06-29: 1 mg via INTRAVENOUS
  Filled 2013-06-29: qty 1

## 2013-06-29 MED ORDER — SODIUM CHLORIDE 0.9 % IV BOLUS (SEPSIS)
1000.0000 mL | Freq: Once | INTRAVENOUS | Status: AC
  Administered 2013-06-29: 1000 mL via INTRAVENOUS

## 2013-06-29 MED ORDER — POTASSIUM CHLORIDE 10 MEQ/100ML IV SOLN
10.0000 meq | Freq: Once | INTRAVENOUS | Status: AC
  Administered 2013-06-29: 10 meq via INTRAVENOUS
  Filled 2013-06-29: qty 100

## 2013-06-29 MED ORDER — DEXTROSE 50 % IV SOLN
INTRAVENOUS | Status: AC
  Administered 2013-06-29: 50 mL
  Administered 2013-06-30: 25 mL
  Filled 2013-06-29: qty 50

## 2013-06-29 MED ORDER — ONDANSETRON HCL 4 MG/2ML IJ SOLN
4.0000 mg | Freq: Once | INTRAMUSCULAR | Status: AC
  Administered 2013-06-29: 4 mg via INTRAVENOUS
  Filled 2013-06-29: qty 2

## 2013-06-29 MED ORDER — LORAZEPAM 2 MG/ML IJ SOLN
1.0000 mg | Freq: Once | INTRAMUSCULAR | Status: AC
  Administered 2013-06-29: 1 mg via INTRAVENOUS
  Filled 2013-06-29: qty 1

## 2013-06-29 MED ORDER — POTASSIUM CHLORIDE CRYS ER 20 MEQ PO TBCR
40.0000 meq | EXTENDED_RELEASE_TABLET | Freq: Once | ORAL | Status: AC
  Administered 2013-06-29: 40 meq via ORAL
  Filled 2013-06-29: qty 2

## 2013-06-29 NOTE — Telephone Encounter (Signed)
Pt's father reports pt has been to [facility] for recent admission and again after discharge for abdominal pain and n/v. Pt is inable to keep anything down including antiemetics.  Dad states pt's BS was stable in the hospital. Pt will be seen in am. Faxed a request to [phone] for records

## 2013-06-29 NOTE — ED Provider Notes (Signed)
CSN: [id number]     Arrival date & time 06/29/13  2102 History  This chart was scribed for Loren Racer, MD by Ardelia Mems, ED Scribe. This patient was seen in room MH03/MH03 and the patient's care was started at 10:03 PM.   Chief Complaint  Patient presents with  . Emesis  . Hypoglycemia    Patient is a 27 y.o. male presenting with vomiting and hypoglycemia. The history is provided by the patient and a parent. No language interpreter was used.  Emesis Severity:  Severe Duration:  3 days Timing:  Intermittent Number of daily episodes:  "many" Relieved by:  Nothing Worsened by:  Nothing tried Ineffective treatments:  None tried Associated symptoms: abdominal pain   Associated symptoms: no fever   Hypoglycemia Initial blood sugar:  42 Severity:  Moderate Onset quality:  Gradual Duration:  1 day Context: recent illness (persistent emesis, can't keep food down)   Relieved by:  Nothing Ineffective treatments:  None tried Associated symptoms: vomiting     HPI Comments: SAJAN CHEATWOOD is a 27 y.o. male with a history of gastroparesis with chronic abdominal pain who presents to the Emergency Department complaining of exacerbation of his chronic abdominal pain. Relative is providing much of the history due to pt's current distress. Pt was recently seen and admitted at [facility] for the same. He was discharged 2 days ago. He reports multiple daily episodes of associated emesis that began 3 days ago. He also complains of hypoglycemia, with an at home blood sugar of 47. Pt's ED blood sugar is 42. Relative believes that this is due to pt not being bale to keep down any food. Pt denies fever or any other symptoms.  PCP- Gershon Crane   Past Medical History  Diagnosis Date  . Diabetes mellitus   . GERD (gastroesophageal reflux disease)   . Asthma   . Hx MRSA infection     on face  . Gastroparesis   . Diabetic neuropathy   . Seizures    Past Surgical History  Procedure Laterality  Date  . Tonsillectomy     Family History  Problem Relation Age of Onset  . Diabetes Father   . Hypertension Father   . Asthma      fhx  . Hypertension      fhx  . Stroke      fhx  . Heart disease Mother    History  Substance Use Topics  . Smoking status: Current Some Day Smoker    Types: Cigars  . Smokeless tobacco: Never Used     Comment: e-cigarette  . Alcohol Use: Yes     Comment: Rarely    Review of Systems  Gastrointestinal: Positive for nausea, vomiting and abdominal pain.  All other systems reviewed and are negative.   Allergies  Cefuroxime axetil; Penicillins; Tessalon; and Shellfish allergy  Home Medications   Current Outpatient Rx  Name  Route  Sig  Dispense  Refill  . insulin aspart (NOVOLOG) 100 UNIT/ML injection   Subcutaneous   Inject 1-17 Units into the skin 4 (four) times daily. Per sliding scale         . insulin glargine (LANTUS) 100 UNIT/ML injection   Subcutaneous   Inject 32 Units into the skin at bedtime.         . metoCLOPramide (REGLAN) 10 MG tablet   Oral   Take 10 mg by mouth 3 (three) times daily.         [name] Bicarbonate (  ZEGERID OTC) 20-1100 MG CAPS   Oral   Take 1 capsule by mouth at bedtime.          . OxyCODONE (OXYCONTIN) 15 mg T12A   Oral   Take 15 mg by mouth every 12 (twelve) hours.         Marland Kitchen oxyCODONE-acetaminophen (PERCOCET) 10-325 MG per tablet   Oral   Take 1 tablet by mouth every 4 (four) hours as needed for pain.         . pregabalin (LYRICA) 100 MG capsule   Oral   Take 100 mg by mouth 3 (three) times daily.         Marland Kitchen zolpidem (AMBIEN) 10 MG tablet   Oral   Take 10 mg by mouth at bedtime as needed for sleep.         Marland Kitchen esomeprazole (NEXIUM) 20 MG capsule   Oral   Take 20 mg by mouth daily before breakfast.         . promethazine (PHENERGAN) 25 MG suppository   Rectal   Place 1 suppository (25 mg total) rectally every 6 (six) hours as needed for nausea.   30 each    11   . promethazine (PHENERGAN) 25 MG suppository   Rectal   Place 1 suppository (25 mg total) rectally every 6 (six) hours as needed for nausea.   12 each   0   . promethazine (PHENERGAN) 25 MG tablet   Oral   Take 1 tablet (25 mg total) by mouth every 6 (six) hours as needed for nausea.   60 tablet   11   . promethazine (PHENERGAN) 25 MG tablet   Oral   Take 1 tablet (25 mg total) by mouth every 6 (six) hours as needed for nausea.   30 tablet   0    Triage Vitals: BP 166/107  Pulse 99  Temp(Src) 98.4 F (36.9 C) (Oral)  Resp 16  SpO2 100%  Physical Exam  Nursing note and vitals reviewed. Constitutional: He is oriented to person, place, and time. He appears well-developed and well-nourished. No distress.  HENT:  Head: Normocephalic and atraumatic.  Eyes: EOM are normal.  Neck: Neck supple. No tracheal deviation present.  Cardiovascular: Normal rate, regular rhythm and normal heart sounds.   Pulmonary/Chest: Effort normal and breath sounds normal. No respiratory distress.  Abdominal: Soft. There is tenderness.  Abdomen is diffusely tender.  Musculoskeletal: Normal range of motion.  Neurological: He is alert and oriented to person, place, and time.  Skin: Skin is warm and dry.  Psychiatric: He has a normal mood and affect. His behavior is normal.    ED Course  Procedures (including critical care time)  DIAGNOSTIC STUDIES: Oxygen Saturation is 100% on RA, normal by my interpretation.    COORDINATION OF CARE: 10:09 PM- Pt and father advised of plan for treatment and pt and father agree.  Medications  sodium chloride 0.9 % bolus 1,000 mL (1,000 mLs Intravenous New Bag/Given 06/29/13 2231)  dextrose 50 % solution (50 mLs  Given 06/29/13 2115)  sodium chloride 0.9 % bolus 1,000 mL (0 mLs Intravenous Stopped 06/29/13 2228)  ondansetron (ZOFRAN) injection 4 mg (4 mg Intravenous Given 06/29/13 2134)  LORazepam (ATIVAN) injection 1 mg (1 mg Intravenous Given 06/29/13 2132)   HYDROmorphone (DILAUDID) injection 1 mg (1 mg Intravenous Given 06/29/13 2141)  potassium chloride SA (K-DUR,KLOR-CON) CR tablet 40 mEq (40 mEq Oral Given 06/29/13 2233)  potassium chloride 10 mEq in 100 mL IVPB (  0 mEq Intravenous Stopped 06/29/13 2329)   Labs Review Labs Reviewed  GLUCOSE, CAPILLARY - Abnormal; Notable for the following:    Glucose-Capillary 42 (*)    All other components within normal limits  CBC WITH DIFFERENTIAL - Abnormal; Notable for the following:    WBC 12.0 (*)    Hemoglobin 11.5 (*)    HCT 33.0 (*)    MCV 77.5 (*)    Monocytes Absolute 1.4 (*)    All other components within normal limits  COMPREHENSIVE METABOLIC PANEL - Abnormal; Notable for the following:    Potassium 3.0 (*)    Glucose, Bld 48 (*)    Total Bilirubin 1.3 (*)    All other components within normal limits  LIPASE, BLOOD - Abnormal; Notable for the following:    Lipase 9 (*)    All other components within normal limits  URINALYSIS, ROUTINE W REFLEX MICROSCOPIC - Abnormal; Notable for the following:    Glucose, UA >1000 (*)    Hgb urine dipstick MODERATE (*)    Ketones, ur 40 (*)    Protein, ur 30 (*)    All other components within normal limits  GLUCOSE, CAPILLARY - Abnormal; Notable for the following:    Glucose-Capillary 104 (*)    All other components within normal limits  URINE MICROSCOPIC-ADD ON   Imaging Review No results found.  MDM   1. Vomiting   2. Hypokalemia   3. Chronic abdominal pain    I personally performed the services described in this documentation, which was scribed in my presence. The recorded information has been reviewed and is accurate.  Patient is now resting comfortably. No more active vomiting in the ED. He's been given 1 L of IV normal saline has been given another. We are attempting to replace his potassium. The patient has a followup appointment with his gastroenterologist in the morning. He signed out to oncoming emergency physician.    Loren Racer, MD 07/01/13 [phone]

## 2013-06-29 NOTE — ED Notes (Signed)
Pt has hx of gastroporesis, just released from [facility] two days ago. Pt presents with low BS at home of 47.

## 2013-06-29 NOTE — ED Provider Notes (Signed)
Medical screening examination/treatment/procedure(s) were performed by non-physician practitioner and as supervising physician I was immediately available for consultation/collaboration.  Daymeon Fischman, MD 06/29/13 1506 

## 2013-06-30 ENCOUNTER — Ambulatory Visit (INDEPENDENT_AMBULATORY_CARE_PROVIDER_SITE_OTHER): Payer: Medicaid Other | Admitting: Physician Assistant

## 2013-06-30 ENCOUNTER — Inpatient Hospital Stay (HOSPITAL_COMMUNITY)
Admission: AD | Admit: 2013-06-30 | Discharge: 2013-07-02 | DRG: 074 | Disposition: A | Discharge status: Home / Self Care | Payer: Medicaid Other | Source: Ambulatory Visit | Attending: Internal Medicine | Admitting: Internal Medicine

## 2013-06-30 ENCOUNTER — Inpatient Hospital Stay (HOSPITAL_COMMUNITY): Payer: Medicaid Other

## 2013-06-30 ENCOUNTER — Other Ambulatory Visit (INDEPENDENT_AMBULATORY_CARE_PROVIDER_SITE_OTHER): Payer: Medicaid Other

## 2013-06-30 ENCOUNTER — Encounter (HOSPITAL_COMMUNITY): Payer: Self-pay | Admitting: *Deleted

## 2013-06-30 ENCOUNTER — Encounter: Payer: Self-pay | Admitting: Physician Assistant

## 2013-06-30 VITALS — BP 138/96 | HR 62 | Wt 124.0 lb

## 2013-06-30 DIAGNOSIS — J45909 Unspecified asthma, uncomplicated: Secondary | ICD-10-CM | POA: Diagnosis present

## 2013-06-30 DIAGNOSIS — G8929 Other chronic pain: Secondary | ICD-10-CM | POA: Diagnosis present

## 2013-06-30 DIAGNOSIS — E876 Hypokalemia: Secondary | ICD-10-CM | POA: Diagnosis present

## 2013-06-30 DIAGNOSIS — F17200 Nicotine dependence, unspecified, uncomplicated: Secondary | ICD-10-CM | POA: Diagnosis present

## 2013-06-30 DIAGNOSIS — E1065 Type 1 diabetes mellitus with hyperglycemia: Secondary | ICD-10-CM

## 2013-06-30 DIAGNOSIS — E1049 Type 1 diabetes mellitus with other diabetic neurological complication: Secondary | ICD-10-CM | POA: Diagnosis not present

## 2013-06-30 DIAGNOSIS — R109 Unspecified abdominal pain: Secondary | ICD-10-CM | POA: Diagnosis present

## 2013-06-30 DIAGNOSIS — R112 Nausea with vomiting, unspecified: Secondary | ICD-10-CM

## 2013-06-30 DIAGNOSIS — IMO0002 Reserved for concepts with insufficient information to code with codable children: Secondary | ICD-10-CM | POA: Diagnosis present

## 2013-06-30 DIAGNOSIS — E1140 Type 2 diabetes mellitus with diabetic neuropathy, unspecified: Secondary | ICD-10-CM

## 2013-06-30 DIAGNOSIS — Z794 Long term (current) use of insulin: Secondary | ICD-10-CM

## 2013-06-30 DIAGNOSIS — K3184 Gastroparesis: Secondary | ICD-10-CM

## 2013-06-30 DIAGNOSIS — E1142 Type 2 diabetes mellitus with diabetic polyneuropathy: Secondary | ICD-10-CM | POA: Diagnosis present

## 2013-06-30 DIAGNOSIS — Z880 Allergy status to penicillin: Secondary | ICD-10-CM

## 2013-06-30 DIAGNOSIS — Z79899 Other long term (current) drug therapy: Secondary | ICD-10-CM

## 2013-06-30 DIAGNOSIS — E1143 Type 2 diabetes mellitus with diabetic autonomic (poly)neuropathy: Secondary | ICD-10-CM | POA: Diagnosis present

## 2013-06-30 DIAGNOSIS — E1149 Type 2 diabetes mellitus with other diabetic neurological complication: Secondary | ICD-10-CM

## 2013-06-30 DIAGNOSIS — E1043 Type 1 diabetes mellitus with diabetic autonomic (poly)neuropathy: Secondary | ICD-10-CM

## 2013-06-30 DIAGNOSIS — K219 Gastro-esophageal reflux disease without esophagitis: Secondary | ICD-10-CM | POA: Diagnosis present

## 2013-06-30 DIAGNOSIS — G40909 Epilepsy, unspecified, not intractable, without status epilepticus: Secondary | ICD-10-CM | POA: Diagnosis present

## 2013-06-30 DIAGNOSIS — E1040 Type 1 diabetes mellitus with diabetic neuropathy, unspecified: Secondary | ICD-10-CM | POA: Diagnosis present

## 2013-06-30 DIAGNOSIS — Z8614 Personal history of Methicillin resistant Staphylococcus aureus infection: Secondary | ICD-10-CM

## 2013-06-30 LAB — URINE MICROSCOPIC-ADD ON

## 2013-06-30 LAB — URINALYSIS, ROUTINE W REFLEX MICROSCOPIC
Bilirubin Urine: NEGATIVE
Leukocytes, UA: NEGATIVE
Nitrite: NEGATIVE
Protein, ur: NEGATIVE mg/dL
Specific Gravity, Urine: 1.014 (ref 1.005–1.030)
Urobilinogen, UA: 0.2 mg/dL (ref 0.0–1.0)
pH: 7 (ref 5.0–8.0)

## 2013-06-30 LAB — COMPREHENSIVE METABOLIC PANEL
AST: 13 U/L (ref 0–37)
BUN: 5 mg/dL — ABNORMAL LOW (ref 6–23)
CO2: 25 mEq/L (ref 19–32)
Calcium: 8.8 mg/dL (ref 8.4–10.5)
Creatinine, Ser: 0.73 mg/dL (ref 0.50–1.35)
GFR calc Af Amer: >90 mL/min (ref >90)
GFR calc non Af Amer: >90 mL/min (ref >90)
Glucose, Bld: 134 mg/dL — ABNORMAL HIGH (ref 70–99)
Total Bilirubin: 0.7 mg/dL (ref 0.3–1.2)
Total Protein: 6.6 g/dL (ref 6.0–8.3)

## 2013-06-30 LAB — CBC
HCT: 34.4 % — ABNORMAL LOW (ref 39.0–52.0)
Hemoglobin: 11.8 g/dL — ABNORMAL LOW (ref 13.0–17.0)
MCHC: 34.3 g/dL (ref 30.0–36.0)
Platelets: 329 10*3/uL (ref 150–400)
RBC: 4.43 MIL/uL (ref 4.22–5.81)

## 2013-06-30 LAB — GLUCOSE, CAPILLARY
Glucose-Capillary: 205 mg/dL — ABNORMAL HIGH (ref 70–99)
Glucose-Capillary: 82 mg/dL (ref 70–99)
Glucose-Capillary: 67 mg/dL — ABNORMAL LOW (ref 70–99)
Glucose-Capillary: 105 mg/dL — ABNORMAL HIGH (ref 70–99)

## 2013-06-30 LAB — LIPASE, BLOOD: Lipase: 12 U/L (ref 11–59)

## 2013-06-30 LAB — MRSA PCR SCREENING: MRSA by PCR: NEGATIVE

## 2013-06-30 LAB — GLUCOSE, RANDOM: Glucose, Bld: 250 mg/dL — ABNORMAL HIGH (ref 70–99)

## 2013-06-30 MED ORDER — INSULIN GLARGINE 100 UNIT/ML ~~LOC~~ SOLN
10.0000 [IU] | Freq: Every day | SUBCUTANEOUS | Status: DC
  Administered 2013-07-01: 10 [IU] via SUBCUTANEOUS
  Filled 2013-06-30 (×2): qty 0.1

## 2013-06-30 MED ORDER — OXYCODONE HCL ER 15 MG PO T12A
15.0000 mg | EXTENDED_RELEASE_TABLET | Freq: Two times a day (BID) | ORAL | Status: DC
  Administered 2013-06-30 – 2013-07-01 (×3): 15 mg via ORAL
  Filled 2013-06-30 (×3): qty 1

## 2013-06-30 MED ORDER — INSULIN ASPART 100 UNIT/ML ~~LOC~~ SOLN: 0.0000 [IU] | SUBCUTANEOUS | Status: DC

## 2013-06-30 MED ORDER — PREGABALIN 50 MG PO CAPS
100.0000 mg | ORAL_CAPSULE | Freq: Three times a day (TID) | ORAL | Status: DC
  Administered 2013-06-30 – 2013-07-01 (×5): 100 mg via ORAL
  Filled 2013-06-30 (×5): qty 2

## 2013-06-30 MED ORDER — SODIUM CHLORIDE 0.9 % IV SOLN
INTRAVENOUS | Status: DC
  Administered 2013-06-30: 13:00:00 via INTRAVENOUS

## 2013-06-30 MED ORDER — ACETAMINOPHEN 325 MG PO TABS: 650.0000 mg | ORAL_TABLET | Freq: Four times a day (QID) | ORAL | Status: DC | PRN

## 2013-06-30 MED ORDER — INSULIN ASPART 100 UNIT/ML ~~LOC~~ SOLN
0.0000 [IU] | SUBCUTANEOUS | Status: DC
  Administered 2013-07-01 (×3): 2 [IU] via SUBCUTANEOUS
  Administered 2013-07-01: 1 [IU] via SUBCUTANEOUS
  Administered 2013-07-02: 01:00:00 2 [IU] via SUBCUTANEOUS
  Administered 2013-07-02: 05:00:00 1 [IU] via SUBCUTANEOUS

## 2013-06-30 MED ORDER — METOCLOPRAMIDE HCL 5 MG/ML IJ SOLN
10.0000 mg | Freq: Four times a day (QID) | INTRAMUSCULAR | Status: DC
  Administered 2013-06-30 – 2013-07-02 (×7): 10 mg via INTRAVENOUS
  Filled 2013-06-30 (×11): qty 2

## 2013-06-30 MED ORDER — INSULIN ASPART 100 UNIT/ML ~~LOC~~ SOLN
0.0000 [IU] | SUBCUTANEOUS | Status: DC
  Administered 2013-06-30: 13:00:00 2 [IU] via SUBCUTANEOUS

## 2013-06-30 MED ORDER — ONDANSETRON HCL 4 MG/2ML IJ SOLN
4.0000 mg | INTRAMUSCULAR | Status: DC | PRN
  Administered 2013-06-30: 17:00:00 4 mg via INTRAVENOUS
  Filled 2013-06-30: qty 2

## 2013-06-30 MED ORDER — INFLUENZA VAC SPLIT QUAD 0.5 ML IM SUSP
0.5000 mL | INTRAMUSCULAR | Status: AC
  Administered 2013-07-01: 0.5 mL via INTRAMUSCULAR
  Filled 2013-06-30 (×2): qty 0.5

## 2013-06-30 MED ORDER — ZOLPIDEM TARTRATE 10 MG PO TABS: 10.0000 mg | ORAL_TABLET | Freq: Every evening | ORAL | Status: DC | PRN

## 2013-06-30 MED ORDER — MORPHINE SULFATE 2 MG/ML IJ SOLN
1.0000 mg | INTRAMUSCULAR | Status: DC | PRN
  Administered 2013-06-30: 2 mg via INTRAVENOUS

## 2013-06-30 MED ORDER — DEXTROSE 50 % IV SOLN: 25.0000 mL | Freq: Once | INTRAVENOUS | Status: AC | PRN

## 2013-06-30 MED ORDER — ONDANSETRON HCL 4 MG/2ML IJ SOLN
4.0000 mg | INTRAMUSCULAR | Status: DC | PRN
  Administered 2013-06-30: 4 mg via INTRAVENOUS

## 2013-06-30 MED ORDER — ACETAMINOPHEN 650 MG RE SUPP: 650.0000 mg | Freq: Four times a day (QID) | RECTAL | Status: DC | PRN

## 2013-06-30 MED ORDER — SODIUM CHLORIDE 0.9 % IV SOLN
INTRAVENOUS | Status: DC
  Administered 2013-06-30 – 2013-07-02 (×5): via INTRAVENOUS

## 2013-06-30 MED ORDER — ONDANSETRON HCL 4 MG/2ML IJ SOLN
4.0000 mg | INTRAMUSCULAR | Status: DC | PRN
  Administered 2013-06-30: 4 mg via INTRAVENOUS
  Filled 2013-06-30: qty 2

## 2013-06-30 MED ORDER — MORPHINE SULFATE 2 MG/ML IJ SOLN
1.0000 mg | INTRAMUSCULAR | Status: DC | PRN
  Administered 2013-06-30: 2 mg via INTRAVENOUS
  Filled 2013-06-30: qty 1

## 2013-06-30 MED ORDER — SODIUM CHLORIDE 0.9 % IV BOLUS (SEPSIS)
1000.0000 mL | Freq: Once | INTRAVENOUS | Status: AC
  Administered 2013-06-30: 15:00:00 1000 mL via INTRAVENOUS

## 2013-06-30 MED ORDER — SODIUM CHLORIDE 0.9 % IJ SOLN
3.0000 mL | Freq: Two times a day (BID) | INTRAMUSCULAR | Status: DC
  Administered 2013-07-01: 3 mL via INTRAVENOUS

## 2013-06-30 MED ORDER — MORPHINE SULFATE 2 MG/ML IJ SOLN
1.0000 mg | INTRAMUSCULAR | Status: DC | PRN
  Administered 2013-06-30 – 2013-07-02 (×8): 2 mg via INTRAVENOUS
  Filled 2013-06-30 (×8): qty 1

## 2013-06-30 MED ORDER — PANTOPRAZOLE SODIUM 40 MG IV SOLR
40.0000 mg | Freq: Two times a day (BID) | INTRAVENOUS | Status: DC
  Administered 2013-06-30 – 2013-07-01 (×3): 40 mg via INTRAVENOUS
  Filled 2013-06-30 (×5): qty 40

## 2013-06-30 NOTE — H&P (Signed)
[facility] History and Physical  ANVAY TENNIS ZOX:[id number] DOB: September 11, 1986 DOA: 06/30/2013  Referring physician: Dr. Rhea Belton PCP: Nelwyn Salisbury, MD  Specialists:   Chief Complaint: worsening abdominal pain with nausea vomiting   HPI: Jason [name] is a 27 y.o. male with past medical history significant for diabetes mellitus with diabetic gastroparesis & neuropathy, GERDpresents with above complaints. He states that he has had worsening abdominal pain along with nausea vomiting for the past 2-3 days. He reports that the abdominal pain is diffuse,sharp and crampy 10/10 intensity.he has also had associated nausea or vomiting, nonbloody. He Denies diarrhea, fevers, dysuria melena and no hematochezia. he was seen in Dr. Lauro Franklin office today and because he has been unable to keep anything down [facility] for further evaluation and management, and GI to follow.   Review of Systems: The patient denies anorexia, fever, weight loss,, vision loss, decreased hearing, hoarseness, chest pain, syncope, dyspnea on exertion, peripheral edema, balance deficits, hemoptysis, melena, hematochezia, severe indigestion/heartburn, hematuria, incontinence, genital sores, muscle weakness, suspicious skin lesions, transient blindness, difficulty walking, depression, unusual weight change, abnormal bleeding.    Past Medical History  Diagnosis Date  . Diabetes mellitus   . GERD (gastroesophageal reflux disease)   . Asthma   . Hx MRSA infection     on face  . Gastroparesis   . Diabetic neuropathy   . Seizures    Past Surgical History  Procedure Laterality Date  . Tonsillectomy     Social History:  reports that he has been smoking Cigars.  He has never used smokeless tobacco. He reports that  drinks alcohol. He reports that he does not use illicit drugs. where does patient live--home Can patient participate in ADLs-yes  Allergies  Allergen Reactions  . Cefuroxime Axetil  Anaphylaxis  . Penicillins Anaphylaxis  . Tessalon [Benzonatate] Anaphylaxis    Can take amoxicillin  . Shellfish Allergy Itching    Family History  Problem Relation Age of Onset  . Diabetes Father   . Hypertension Father   . Asthma      fhx  . Hypertension      fhx  . Stroke      fhx  . Heart disease Mother     Prior to Admission medications   Medication Sig Start Date End Date Taking? Authorizing Provider  esomeprazole (NEXIUM) 20 MG capsule Take 20 mg by mouth daily before breakfast.    Historical Provider, MD  insulin aspart (NOVOLOG) 100 UNIT/ML injection Inject 1-17 Units into the skin 4 (four) times daily. Per sliding scale    Historical Provider, MD  insulin glargine (LANTUS) 100 UNIT/ML injection Inject 32 Units into the skin at bedtime.    Historical Provider, MD  metoCLOPramide (REGLAN) 10 MG tablet Take 10 mg by mouth 3 (three) times daily.    Historical Provider, MD  Omeprazole-Sodium Bicarbonate (ZEGERID OTC) 20-1100 MG CAPS Take 1 capsule by mouth at bedtime.     Historical Provider, MD  OxyCODONE (OXYCONTIN) 15 mg T12A Take 15 mg by mouth every 12 (twelve) hours.    Historical Provider, MD  oxyCODONE-acetaminophen (PERCOCET) 10-325 MG per tablet Take 1 tablet by mouth every 4 (four) hours as needed for pain.    Historical Provider, MD  pregabalin (LYRICA) 100 MG capsule Take 100 mg by mouth 3 (three) times daily.    Historical Provider, MD  promethazine (PHENERGAN) 25 MG suppository Place 1 suppository (25 mg total) rectally every 6 (six) hours as needed for  nausea. 06/26/13   Elson Areas, PA-C  promethazine (PHENERGAN) 25 MG tablet Take 1 tablet (25 mg total) by mouth every 6 (six) hours as needed for nausea. 06/02/13   Nelwyn Salisbury, MD  zolpidem (AMBIEN) 10 MG tablet Take 10 mg by mouth at bedtime as needed for sleep.    Historical Provider, MD   Physical Exam: Filed Vitals:   06/30/13 1300  BP: 139/90  Pulse: 89  Temp: 98.7 F (37.1 C)  Resp: 14     Constitutional: Vital signs reviewed.  Patient is a well-developed thin appearing young male in no acute distress and cooperative with exam. Alert and oriented x3.  Head: Normocephalic and atraumatic Nose: No erythema or drainage noted.  Turbinates normal Mouth: no erythema or exudates, slightly dryMM Eyes: PERRL, EOMI, conjunctivae normal, No scleral icterus.  Neck: Supple, Trachea midline normal ROM, No JVD, mass, thyromegaly, or carotid bruit present.  Cardiovascular: tachy,regularS1 normal, S2 normal, no MRG, pulses symmetric and intact bilaterally Pulmonary/Chest: normal respiratory effort, CTAB, no wheezes, rales, or rhonchi Abdominal: Soft.,diffuse tenderness present, no rebound, non-distended, bowel sounds are normal, no masses, organomegaly, or guarding present.  GU: no CVA tenderness Extremities: No cyanosis and no edema Neurological: A&O x3, Strength is normal and symmetric bilaterally, cranial nerve II-XII are grossly intact, no focal motor deficit, sensory intact to light touch bilaterally.  Skin: Warm, dry and intact. No rash.  Psychiatric: Normal mood and affect. speech and behavior is normal.    Labs on Admission:  Basic Metabolic Panel:  Recent Labs Lab 06/26/13 1350 06/29/13 2115 06/30/13 1015  NA 139 137  --   K 3.9 3.0*  --   CL 97 99  --   CO2 28 26  --   GLUCOSE 488* 48* 250*  BUN 7 9  --   CREATININE 0.70 0.80  --   CALCIUM 10.0 9.7  --    Liver Function Tests:  Recent Labs Lab 06/26/13 1350 06/29/13 2115  AST 23 11  ALT 15 9  ALKPHOS 106 86  BILITOT 0.9 1.3*  PROT 7.7 7.0  ALBUMIN 3.8 3.5    Recent Labs Lab 06/29/13 2115  LIPASE 9*   No results found for this basename: AMMONIA,  in the last 168 hours CBC:  Recent Labs Lab 06/26/13 1303 06/29/13 2115  WBC 7.1 12.0*  NEUTROABS 3.9 6.8  HGB 12.2* 11.5*  HCT 36.3* 33.0*  MCV 80.0 77.5*  PLT 258 360   Cardiac Enzymes: No results found for this basename: CKTOTAL, CKMB,  CKMBINDEX, TROPONINI,  in the last 168 hours  BNP (last 3 results) No results found for this basename: PROBNP,  in the last 8760 hours CBG:  Recent Labs Lab 06/26/13 1707 06/29/13 2105 06/29/13 2229 06/29/13 2344 06/30/13 1250  GLUCAP 379* 42* 104* 97 205*    Radiological Exams on Admission: No results found.    Assessment/Plan Active Problems: Gastroparesis due to DM/Abdominal  pain, other specified site -As discussed above, will obtain abdominal x-rays, lipase, UA and follow -place on Reglan, antiemetics, and analgesics-IV/by mouth for pain management -n.p.o. For now, follow and start on by mouth as he improves. -GI to follow for further recommendations   GERD -place on PPI   Painful diabetic neuropathy -continue outpatient medications Diabetes mellitus  -lower dose insulin for now while n.p.o., monitor Accu-Cheks cover with sliding scale insulin and follow   Hypokalemia -recheck potassium,follow and replace as appropriate     Code Status: full Family Communication:wife at  bedside Disposition Plan: admitted to Tele  Time spent: >19mins  Kela Millin [facility] Pager [phone]  If 7PM-7AM, please contact night-coverage [email or web address] Password [email or web address] 06/30/2013, 2:20 PM

## 2013-06-30 NOTE — Care Management Note (Addendum)
    Page 1 of 1   07/02/2013     10:37:13 AM   CARE MANAGEMENT NOTE 07/02/2013  Patient:  Jason [name], Jason [name]   Account Number:  [id number]  Date Initiated:  06/30/2013  Documentation initiated by:  Lanier Clam  Subjective/Objective Assessment:   26 Y/O M ADMITTED W/N/V,LOW GLUCOSE.DM.ZO:XWRUEAVW GSTROPARESIS.     Action/Plan:   FROM HOME W/FIANCEE.HAS PCP,PHARMACY,RW.NO HEALTH INSURANCE.   Anticipated DC Date:  07/02/2013   Anticipated DC Plan:  HOME/SELF CARE  In-house referral  Financial Counselor      DC Planning Services  CM consult      Choice offered to / List presented to:             Status of service:  Completed, signed off Medicare Important Message given?   (If response is "NO", the following Medicare IM given date fields will be blank) Date Medicare IM given:   Date Additional Medicare IM given:    Discharge Disposition:  HOME/SELF CARE  Per UR Regulation:  Reviewed for med. necessity/level of care/duration of stay  If discussed at Long Length of Stay Meetings, dates discussed:    Comments:  07/02/13 Chasten Blaze RN,BSN NCM [phone] D/C HOME NO NEEDS OR ORDERS.  06/30/13 Amarya Kuehl RN,BSN NCM [phone] PROVIDED W/HEALTH INSURANCE WEBSITE,& HEALTH INSURANCE INFO PACKET W/COMMUNITY RESOURCES.

## 2013-06-30 NOTE — Progress Notes (Signed)
Around 1915 NT called [name] emergently to the room. He was helping pt to the bathroom and pt passed out. Was able to land on bed. Pt slowly able to wake up, BP 85/47, CBG 67. As pt woke up more, c/o generalized pain from neuropathy. Also c/o being hungry. BP went up on it's own without interventions. Hypoglycemia protocol followed with follow up CBG 105. Pt now c/o left shoulder pain from fall previously in the day. Able to do limited ROM but with pain and grimacing. MD on call notified. 1 view shoulder xray ordered, radiology suggested I call to confirm she just wants a 1 view. Verified MD on call just wants a 1 view.   Will continue to monitor through the night Shameer Molstad, Ok Edwards RN

## 2013-06-30 NOTE — Progress Notes (Signed)
Hypoglycemic Event  CBG: 67 at 1920  Treatment: D50 IV 25 mL at 1927  Symptoms: Hungry  Follow-up CBG: Time:2000 CBG Result:105  Possible Reasons for Event: Inadequate meal intake  Comments/MD notified:    Zlata Alcaide, Ok Edwards  Remember to initiate Hypoglycemia Order Set & complete

## 2013-06-30 NOTE — Progress Notes (Signed)
Pt made loud holler and RN went to check pt in room. He was found on the bathroom floor and attempting to get up. He stated that he passed out walking to the toilet. He said he did not hit his head. No new skin tears noted. BP checked- 84/57. MD made aware. Will do NS bolus and increase IVF. Will continue to monitor. Julio Sicks RN

## 2013-06-30 NOTE — Progress Notes (Addendum)
Subjective:    Patient ID: Jason [name], male    DOB: Aug 21, 1986, 27 y.o.   MRN: [id number]  HPI  Jason [name] is a 27 year old African American male known to Dr. Rhea Belton who has been seen for diabetic gastroparesis. He is very poorly controlled type 1 diabetes associated with neuropathy and a chronic pain syndrome. He has had multiple prior admissions with DKA. He had been seen in our office earlier this summer and had been given a trial of Reglan 10 mg 4 times daily for symptoms of intermittent nausea and vomiting. Hefelt  Reglan did help his nausea and vomiting however he had side effect of diarrhea and also had complained of a stiffness and soreness bilaterally in his lower extremities which was concerning for an adverse side effects from the Reglan so this was discontinued. We attempted to get him erythromycin but could not find this in suspension form and he has not been regularly on a prokinetic for that reason. He also has been on chronic narcotics in the form of oxycodone which obviously is not helping his gastroparesis and this is been discussed with patient. He underwent upper endoscopy with Dr. Rhea Belton July 2014 this was a negative exam however he was positive for H. pylori and subsequently treated with a Prevpac. Patient has now had 3 emergency room visits in our system within the past one month including 1 late last night. He also had in remission the [facility] over this past weekend for 2 days and was admitted with poorly controlled diabetes nausea vomiting and gastroparesis symptoms. His history today is given by his fianc. She says he really has not been able to keep much of anything down over the past week and is not keeping his meds down. She is not clear of his course in the emergency room last evening or why he was discharged. Patient had a blood sugar greater than 459  he was admitted to [facility], and  last night in the emergency room here presenting blood  sugar was 42. She relates that when he is hospitalized and placed on IV fluids and receives all his medications the IV his symptoms do improve however when he tries to take his medications by mouth he starts having problems with vomiting. Patient says he did not feel any better when he left the emergency room last night and had not had a trial of keeping down by mouth his. Stat glucose in our office today is 250 labs from last evening in the emergency room showed a WBC of 12 hemoglobin 11.5 hematocrit of 33 MCV of 77 (10 glucose was 42 potassium was 3 sodium 137 BUN 9 creatinine 0.8. This patient has had difficulty obtaining his medications at times due to lack of insurance and money. He has applied for Medicaid and that is pending. Other providers notes also states that he has been trying to see an endocrinologist but has not been accepted due to lack of ability to pay and therefore has not had access. . Patient appears miserable in the office today again unable to stop vomiting and according to he and his fianc really has not kept any by mouth is down over the past week.    Review of Systems  Constitutional: Positive for activity change, appetite change, fatigue and unexpected weight change.  HENT: Negative.   Eyes: Negative.   Respiratory: Negative.   Cardiovascular: Negative.   Gastrointestinal: Positive for nausea, vomiting and abdominal pain.  Endocrine: Negative.  Musculoskeletal: Negative.   Skin: Negative.   Allergic/Immunologic: Negative.   Neurological: Positive for weakness and light-headedness.  Hematological: Negative.   Psychiatric/Behavioral: Negative.    Outpatient Prescriptions Prior to Visit  Medication Sig Dispense Refill  . esomeprazole (NEXIUM) 20 MG capsule Take 20 mg by mouth daily before breakfast.      . insulin aspart (NOVOLOG) 100 UNIT/ML injection Inject 1-17 Units into the skin 4 (four) times daily. Per sliding scale      . insulin glargine (LANTUS) 100  UNIT/ML injection Inject 32 Units into the skin at bedtime.      . metoCLOPramide (REGLAN) 10 MG tablet Take 10 mg by mouth 3 (three) times daily.      Jason [name] Bicarbonate (ZEGERID OTC) 20-1100 MG CAPS Take 1 capsule by mouth at bedtime.       . OxyCODONE (OXYCONTIN) 15 mg T12A Take 15 mg by mouth every 12 (twelve) hours.      Marland Kitchen oxyCODONE-acetaminophen (PERCOCET) 10-325 MG per tablet Take 1 tablet by mouth every 4 (four) hours as needed for pain.      . pregabalin (LYRICA) 100 MG capsule Take 100 mg by mouth 3 (three) times daily.      . promethazine (PHENERGAN) 25 MG suppository Place 1 suppository (25 mg total) rectally every 6 (six) hours as needed for nausea.  12 each  0  . promethazine (PHENERGAN) 25 MG tablet Take 1 tablet (25 mg total) by mouth every 6 (six) hours as needed for nausea.  60 tablet  11  . zolpidem (AMBIEN) 10 MG tablet Take 10 mg by mouth at bedtime as needed for sleep.      . promethazine (PHENERGAN) 25 MG suppository Place 1 suppository (25 mg total) rectally every 6 (six) hours as needed for nausea.  30 each  11  . promethazine (PHENERGAN) 25 MG tablet Take 1 tablet (25 mg total) by mouth every 6 (six) hours as needed for nausea.  30 tablet  0   No facility-administered medications prior to visit.   Allergies  Allergen Reactions  . Cefuroxime Axetil Anaphylaxis  . Penicillins Anaphylaxis  . Tessalon [Benzonatate] Anaphylaxis    Can take amoxicillin  . Shellfish Allergy Itching   Patient Active Problem List   Diagnosis Date Noted  . Gastroparesis due to DM 06/02/2013  . Acute renal failure 04/18/2013  . Syncope and collapse 04/18/2013  . Chronic low back pain 04/18/2013  . Diarrhea 04/18/2013  . DKA (diabetic ketoacidoses) 04/11/2013  . Dental abscess 03/20/2013  . Hyperglycemia 03/20/2013  . Dehydration 03/20/2013  . Painful diabetic neuropathy 03/20/2013  . Diabetic hyperosmolar non-ketotic state 01/29/2013  . Nausea and vomiting 01/29/2013  .  Traumatic ecchymosis of toe 01/29/2013  . Wound cellulitis 01/29/2013  . Tobacco abuse 01/29/2013  . Noncompliance with diet and medication regimen 01/29/2013  . CELLULITIS AND ABSCESS OF FACE 08/27/2009  . SPRAIN&STRAIN OTH SPEC SITES SHOULDER&UPPER ARM 06/09/2008  . CONTUSION OF CHEST WALL 06/09/2008  . VIRAL URI 09/15/2007  . GERD 09/15/2007  . Type 1 diabetes, uncontrolled, with neuropathy 05/29/2007  . ASTHMA 05/29/2007   History  Substance Use Topics  . Smoking status: Current Some Day Smoker    Types: Cigars  . Smokeless tobacco: Never Used     Comment: e-cigarette  . Alcohol Use: Yes     Comment: Rarely   family history includes Asthma in an other family member; Diabetes in his father; Heart disease in his mother; Hypertension in his father and  another family member; Stroke in an other family member.     Objective:   Physical Exam     Well-developed chronically ill-appearing very thin African American male lying on the stretcher vomiting actively and moaning . Blood pressure 130/96 pulse 62 weight 124 , BMI 18, HEENT nontraumatic normocephalic EOMI PERRLA sclera anicteric, Supple, Cardiovascular regular rate and rhythm with S1-S2 no murmur or gallop, Pulmonary clear bilaterally, Abdomen soft bowel sounds are present there is no focal tenderness no guarding or rebound bowel sounds are positive but hypoactive, Rectal exam not done, Extremities he has trace edema in the feet and ankles, Psych patient is sleepy but arouses when spoken to and answers appropriately..       Assessment & Plan:  # 27 year old [place] American male with uncontrolled type 1 diabetes complicated by neuropathy  #2 intractable nausea and vomiting and inability to keep down by mouth's secondary to poorly controlled diabetes and secondary gastroparesis #3 chronic pain syndrome with chronic narcotic use-adversely affecting his gastroparesis #4 hypokalemia secondary to #1  Plan; patient requires  hospitalization for management of his current situation, he is unable to keep down any thing by mouth ,including his medications and thus far has not been able to absorb oral anti-emetics etc. effectively enough to allow him to eat or keep down meds. Have discussed with Dr. Benjamine Mola and patient will be admitted to the hospitalist service at [facility] and placed on telemetry We can follow from a GI perspective. Is not clear whether he can tolerate Reglan but may be worth another trial, if he is unable to tolerate Reglan would try erythromycin and once he gets Medicaid we may be able to place him on domperidone but this will take some time. Will need to be certain that the patient can tolerate an oral regimen prior to discharging him from the hospital. Ultimately he may need consideration of gastric pacemaker , given his refractory gastroparesis .   Addendum: Reviewed and agree with management including hospitalization. Endocrinology as an outpatient will be essential. Beverley Fiedler, MD

## 2013-07-01 DIAGNOSIS — E876 Hypokalemia: Secondary | ICD-10-CM

## 2013-07-01 LAB — BASIC METABOLIC PANEL
BUN: <3 mg/dL — ABNORMAL LOW (ref 6–23)
CO2: 30 mEq/L (ref 19–32)
Calcium: 8.5 mg/dL (ref 8.4–10.5)
Chloride: 98 mEq/L (ref 96–112)
Glucose, Bld: 81 mg/dL (ref 70–99)
Sodium: 136 mEq/L (ref 135–145)

## 2013-07-01 LAB — GLUCOSE, CAPILLARY
Glucose-Capillary: 139 mg/dL — ABNORMAL HIGH (ref 70–99)
Glucose-Capillary: 160 mg/dL — ABNORMAL HIGH (ref 70–99)
Glucose-Capillary: 168 mg/dL — ABNORMAL HIGH (ref 70–99)
Glucose-Capillary: 213 mg/dL — ABNORMAL HIGH (ref 70–99)
Glucose-Capillary: 85 mg/dL (ref 70–99)

## 2013-07-01 LAB — URINE CULTURE
Colony Count: NO GROWTH
Culture: NO GROWTH

## 2013-07-01 MED ORDER — POTASSIUM CHLORIDE 10 MEQ/100ML IV SOLN
10.0000 meq | INTRAVENOUS | Status: AC
  Administered 2013-07-01 (×3): 10 meq via INTRAVENOUS
  Filled 2013-07-01 (×3): qty 100

## 2013-07-01 MED ORDER — POTASSIUM CHLORIDE CRYS ER 20 MEQ PO TBCR
40.0000 meq | EXTENDED_RELEASE_TABLET | Freq: Once | ORAL | Status: AC
  Administered 2013-07-01: 40 meq via ORAL
  Filled 2013-07-01: qty 2

## 2013-07-01 MED ORDER — POTASSIUM CHLORIDE CRYS ER 20 MEQ PO TBCR
40.0000 meq | EXTENDED_RELEASE_TABLET | Freq: Two times a day (BID) | ORAL | Status: AC
  Administered 2013-07-01 – 2013-07-02 (×2): 40 meq via ORAL
  Filled 2013-07-01 (×3): qty 2

## 2013-07-01 MED ORDER — DEXTROSE 50 % IV SOLN
INTRAVENOUS | Status: AC
  Filled 2013-07-01: qty 50

## 2013-07-01 MED ORDER — POTASSIUM CHLORIDE 20 MEQ PO PACK
40.0000 meq | PACK | Freq: Two times a day (BID) | ORAL | Status: DC
  Filled 2013-07-01 (×2): qty 2

## 2013-07-01 NOTE — Progress Notes (Signed)
Hypoglycemic Event  CBG: 55  Treatment: 15 GM carbohydrate snack  Symptoms: None  Follow-up CBG: Time 0830 CBG Result: 85  Possible Reasons for Event: Unknown  Comments/MD notified:    Maeola Harman  Remember to initiate Hypoglycemia Order Set & complete

## 2013-07-01 NOTE — Progress Notes (Signed)
Inpatient Diabetes Program Recommendations  AACE/ADA: New Consensus Statement on Inpatient Glycemic Control (2013)  Target Ranges:  Prepandial:   less than 140 mg/dL      Peak postprandial:   less than 180 mg/dL (1-2 hours)      Critically ill patients:  140 - 180 mg/dL   Reason for Visit: Hypoglycemia and Hyperglycemia  26y.o. male with past medical history significant for diabetes mellitus 1 with diabetic gastroparesis & neuropathy, GERDpresents with above complaints. He states that he has had worsening abdominal pain along with nausea vomiting for the past 2-3 days. Fifth admission and 10 ED visits in past 6 mo.   Noted in patient's chart that he has no insurance and has Lantus and Novolog as his home medications. Diabetes Coordinator reports that he has exhausted his resources (family and friends) in helping him purchase his insulins. Patient reports that he has tried several other insulins in the past and they do not keep his blood sugar controlled as well as Lantus and Novolog. He prefers to stay on Lantus and Novolog at this time. Patient reports that he has already applied for Medicaid and is awaiting to hear from them. May want to consider [facility] for ongoing diabetes management and assistance with medications.  Results for MARKO, SKALSKI (MRN [id number]) as of 07/01/2013 16:20  Ref. Range 06/30/2013 12:50 06/30/2013 17:29 06/30/2013 19:20 06/30/2013 20:01 07/01/2013 00:13 07/01/2013 03:32 07/01/2013 07:50 07/01/2013 08:20 07/01/2013 11:52  Glucose-Capillary Latest Range: 70-99 mg/dL 562 (H) 82 67 (L) 130 (H) 139 (H) 101 (H) 55 (L) 85 160 (H)      Results for ONIS, MARKOFF (MRN [id number]) as of 07/01/2013 16:20  Ref. Range 06/30/2013 15:31 07/01/2013 04:40  Sodium Latest Range: 135-145 mEq/L 135 136  Potassium Latest Range: 3.5-5.1 mEq/L 2.9 (L) 2.4 (LL)  Chloride Latest Range: 96-112 mEq/L 99 98  CO2 Latest Range: 19-32 mEq/L 25 30  BUN Latest Range: 6-23 mg/dL  5 (L) <3 (L)  Creatinine Latest Range: 0.50-1.35 mg/dL 2.95 2.84  Calcium Latest Range: 8.4-10.5 mg/dL 8.8 8.5  GFR calc non Af Amer Latest Range: >90 mL/min >90 >90  GFR calc Af Amer Latest Range: >90 mL/min >90 >90  Glucose Latest Range: 70-99 mg/dL 132 (H) 81  Alkaline Phosphatase Latest Range: 39-117 U/L 77    Very labile blood sugars.  No basal insulin in past 24 hours.  Add Lantus 12 units QHS.. Will likely need Novolog meal coverage at some point. Check HgbA1C to assess glycemic control prior to hospitalization.  Will f/u in am. Thank you. Ailene Ards, RD, LDN, CDE Inpatient Diabetes Program [phone]

## 2013-07-01 NOTE — Progress Notes (Signed)
[facility] PROGRESS NOTE  Jason [name] AVW:[id number] DOB: 11-17-1985 DOA: 06/30/2013 PCP: Nelwyn Salisbury, MD  HPI: Jason [name] is a 27 y.o. male with past medical history significant for diabetes mellitus with diabetic gastroparesis & neuropathy, GERDpresents with above complaints. He states that he has had worsening abdominal pain along with nausea vomiting for the past 2-3 days. He reports that the abdominal pain is diffuse,sharp and crampy 10/10 intensity.he has also had associated nausea or vomiting, nonbloody. He Denies diarrhea, fevers, dysuria melena and no hematochezia. he was seen in Dr. Lauro Franklin office today and because he has been unable to keep anything down [facility] for further evaluation and management, and GI to follow.  Assessment/Plan: Gastroparesis due to DM/Abdominal pain, other specified site  -As discussed above, will obtain abdominal x-rays, lipase, UA and follow  -place on Reglan, antiemetics, and analgesics-IV/by mouth for pain management  - feeling much better, wants to eat. On Lantus 10.  GERD  -place on PPI  Painful diabetic neuropathy  -continue outpatient medications  Diabetes mellitus  - on Lantus 10. He is on 32 at home and has lows very frequently.  Hypokalemia  -recheck potassium,follow and replace as appropriate - severe this morning, replete and monitor.   Code Status: Full Family Communication: none  Disposition Plan: inpatient  Consultants:  none  Procedures:  none  Anti-infectives   None     Antibiotics Given (last 72 hours)   None      HPI/Subjective: - feeling great, without complaints, wants to go home.   Objective: Filed Vitals:   07/01/13 0038 07/01/13 0040 07/01/13 0551 07/01/13 1317  BP: 153/93 149/98 134/96 127/55  Pulse: 93 95 90 92  Temp:   98.3 F (36.8 C) 98.5 F (36.9 C)  TempSrc:   Oral Oral  Resp:   14 16  Height:      Weight:      SpO2:   100% 100%     Intake/Output Summary (Last 24 hours) at 07/01/13 1741 Last data filed at 07/01/13 1500  Gross per 24 hour  Intake 3772.5 ml  Output   4300 ml  Net -527.5 ml   Filed Weights   06/30/13 1300  Weight: 56.8 kg (125 lb 3.5 oz)    Exam:  General:  NAD  Cardiovascular: regular rate and rhythm, without MRG  Respiratory: good air movement, clear to auscultation throughout, no wheezing, ronchi or rales  Abdomen: soft, not tender to palpation, positive bowel sounds  MSK: no peripheral edema  Neuro: CN 2-12 grossly intact, MS 5/5 in all 4  Data Reviewed: Basic Metabolic Panel:  Recent Labs Lab 06/26/13 1350 06/29/13 2115 06/30/13 1015 06/30/13 1531 07/01/13 0440  NA 139 137  --  135 136  K 3.9 3.0*  --  2.9* 2.4*  CL 97 99  --  99 98  CO2 28 26  --  25 30  GLUCOSE 488* 48* 250* 134* 81  BUN 7 9  --  5* <3*  CREATININE 0.70 0.80  --  0.73 0.73  CALCIUM 10.0 9.7  --  8.8 8.5   Liver Function Tests:  Recent Labs Lab 06/26/13 1350 06/29/13 2115 06/30/13 1531  AST 23 11 13   ALT 15 9 10   ALKPHOS 106 86 77  BILITOT 0.9 1.3* 0.7  PROT 7.7 7.0 6.6  ALBUMIN 3.8 3.5 3.1*    Recent Labs Lab 06/29/13 2115 06/30/13 1530  LIPASE 9* 12   CBC:  Recent  Labs Lab 06/26/13 1303 06/29/13 2115 06/30/13 1530  WBC 7.1 12.0* 8.6  NEUTROABS 3.9 6.8  --   HGB 12.2* 11.5* 11.8*  HCT 36.3* 33.0* 34.4*  MCV 80.0 77.5* 77.7*  PLT 258 360 329   Cardiac Enzymes: No results found for this basename: CKTOTAL, CKMB, CKMBINDEX, TROPONINI,  in the last 168 hours BNP (last 3 results) No results found for this basename: PROBNP,  in the last 8760 hours CBG:  Recent Labs Lab 07/01/13 0013 07/01/13 0332 07/01/13 0750 07/01/13 0820 07/01/13 1152  GLUCAP 139* 101* 55* 85 160*    Recent Results (from the past 240 hour(s))  MRSA PCR SCREENING     Status: None   Collection Time    06/30/13  4:49 PM      Result Value Range Status   MRSA by PCR NEGATIVE  NEGATIVE Final    Comment:            The GeneXpert MRSA Assay (FDA     approved for NASAL specimens     only), is one component of a     comprehensive MRSA colonization     surveillance program. It is not     intended to diagnose MRSA     infection nor to guide or     monitor treatment for     MRSA infections.     Studies: Dg Shoulder 1v Left  06/30/2013   CLINICAL DATA:  Fall.  Left shoulder pain.  EXAM: LEFT SHOULDER - 1 VIEW  COMPARISON:  None.  FINDINGS: Only a single view is provided. No fracture is identified. No evidence of dislocation is seen although an axillary or Y-view is needed to confirm position of the humeral head. The acromioclavicular joint is intact. Imaged left lung and ribs appear normal.  IMPRESSION: Negative single view of the left shoulder.   Electronically Signed   By: Drusilla Kanner M.D.   On: 06/30/2013 23:41   Acute Abdominal Series  06/30/2013   CLINICAL DATA:  Abdominal pain. Nausea and vomiting. Diabetic with current history of gastroparesis.  EXAM: ACUTE ABDOMEN SERIES (ABDOMEN 2 VIEW & CHEST 1 VIEW)  COMPARISON:  Acute abdomen series 06/27/2013. Two-view chest x-ray 04/18/2013. CT abdomen and pelvis 04/11/2013.  FINDINGS: Interval development of a dilated loop of small bowel in the mid abdomen since the examination 3 days ago. No evidence of bowel distention elsewhere. No evidence of free air on the lateral decubitus image. Numerous phleboliths in the left side of the pelvis and solitary phlebolith in the right side of the pelvis as noted on the prior examinations. No visible opaque urinary tract calculi. Regional skeleton unremarkable apart from a curvilinear calcification at the symphysis pubis which is unchanged and may reflect degenerative changes or an old avulsion type injury.  Cardiomediastinal silhouette unremarkable and unchanged. Lungs clear. Bronchovascular markings normal. Pulmonary vascularity normal. No visible pleural effusions. No pneumothorax. Old healed fracture  involving the distal right clavicle again noted.  IMPRESSION: 1. Interval development of an isolated dilated loop of small bowel in the mid abdomen since the examination 3 days ago. This may reflect early partial small bowel obstruction or a sentinel loop in a patient with inflammatory abdominal disease such as pancreatitis. 2. No free intraperitoneal air. 3. No acute cardiopulmonary disease.   Electronically Signed   By: Hulan Saas   On: 06/30/2013 20:19    Scheduled Meds: . insulin aspart  0-9 Units Subcutaneous Q4H  . insulin glargine  10 Units Subcutaneous  QHS  . metoCLOPramide (REGLAN) injection  10 mg Intravenous Q6H  . OxyCODONE  15 mg Oral Q12H  . pantoprazole (PROTONIX) IV  40 mg Intravenous Q12H  . potassium chloride  40 mEq Oral BID  . pregabalin  100 mg Oral TID  . sodium chloride  3 mL Intravenous Q12H   Continuous Infusions: . sodium chloride 125 mL/hr at 07/01/13 1629   Active Problems:   GERD   Painful diabetic neuropathy   Gastroparesis due to DM   Abdominal  pain, other specified site   Hypokalemia  Time spent: 25  Pamella Pert, MD [facility] Pager [phone]. If 7 PM - 7 AM, please contact night-coverage at [email or web address], password [facility] 07/01/2013, 5:41 PM  LOS: 1 day

## 2013-07-01 NOTE — Progress Notes (Signed)
CRITICAL VALUE ALERT  Critical value received:  K 2.4  Date of notification:  07/01/2013   Time of notification:  0521  Critical value read back:yes  Nurse who received alert:  Jinnie Onley, Ok Edwards RN  MD notified (1st page):  Dr Elisabeth Pigeon   Time of first page:  0530  MD notified (2nd page):  Time of second page:  Responding MD:  Dr. Elisabeth Pigeon, orders entered  Time MD responded:  [phone]

## 2013-07-01 NOTE — Progress Notes (Signed)
Nutrition Brief Note  Patient identified on the Malnutrition Screening Tool (MST) Report. RD discussed weight hx with patient. Per chart, pt with varying weights ranging from 125 - 157 lb x 5 months. He notes that his weight is highly variable with his DM. We discussed better nutritional management of his gastroparesis - avoiding large meals (which he consumes once he starts feeling better at home) and avoiding fried and high fat foods. Pt verbalized understanding. Offered option of adding Glucerna Shakes once diet is advanced, however pt declined. He is hungry and ready to eat.   Wt Readings from Last 15 Encounters:  06/30/13 125 lb 3.5 oz (56.8 kg)  06/30/13 124 lb (56.246 kg)  06/26/13 127 lb (57.607 kg)  06/02/13 121 lb (54.885 kg)  05/21/13 127 lb (57.607 kg)  04/27/13 127 lb (57.607 kg)  04/23/13 127 lb 4 oz (57.72 kg)  04/12/13 129 lb 13.6 oz (58.9 kg)  04/10/13 129 lb (58.514 kg)  04/01/13 129 lb (58.514 kg)  03/31/13 129 lb 2 oz (58.571 kg)  03/28/13 140 lb (63.504 kg)  03/20/13 131 lb 9.6 oz (59.693 kg)  01/28/13 128 lb 4.8 oz (58.196 kg)  01/12/13 157 lb (71.215 kg)    Body mass index is 19.04 kg/(m^2). Patient meets criteria for normal weight based on current BMI.   Current diet order is Clear Liquids, patient is consuming approximately 50-100% of meals at this time. Labs and medications reviewed.   No nutrition interventions warranted at this time. If nutrition issues arise, please consult RD.   Jarold Motto MS, RD, LDN Pager: [phone] After-hours pager: [phone]

## 2013-07-02 LAB — GLUCOSE, CAPILLARY
Glucose-Capillary: 124 mg/dL — ABNORMAL HIGH (ref 70–99)
Glucose-Capillary: 160 mg/dL — ABNORMAL HIGH (ref 70–99)

## 2013-07-02 LAB — POTASSIUM: Potassium: 3.7 mEq/L (ref 3.5–5.1)

## 2013-07-02 MED ORDER — OXYCODONE-ACETAMINOPHEN 10-325 MG PO TABS: 1.0000 | ORAL_TABLET | Freq: Four times a day (QID) | ORAL | Status: DC | PRN

## 2013-07-02 MED ORDER — PREGABALIN 100 MG PO CAPS: 100.0000 mg | ORAL_CAPSULE | Freq: Three times a day (TID) | ORAL | Status: DC

## 2013-07-02 MED ORDER — ONDANSETRON HCL 8 MG PO TABS: 8.0000 mg | ORAL_TABLET | Freq: Two times a day (BID) | ORAL | Status: DC | PRN

## 2013-07-02 MED ORDER — POTASSIUM CHLORIDE ER 10 MEQ PO TBCR: 10.0000 meq | EXTENDED_RELEASE_TABLET | Freq: Every day | ORAL | Status: DC

## 2013-07-02 MED ORDER — ZOLPIDEM TARTRATE 10 MG PO TABS: 10.0000 mg | ORAL_TABLET | Freq: Every evening | ORAL | Status: DC | PRN

## 2013-07-02 MED ORDER — INSULIN GLARGINE 100 UNIT/ML ~~LOC~~ SOLN: 10.0000 [IU] | Freq: Every day | SUBCUTANEOUS | Status: DC

## 2013-07-02 MED ORDER — METOCLOPRAMIDE HCL 10 MG/10ML PO SOLN: 10.0000 mg | Freq: Three times a day (TID) | ORAL | Status: DC | PRN

## 2013-07-02 NOTE — Discharge Summary (Signed)
Physician Discharge Summary  Jason [name] ZOX:[id number] DOB: [birth year] DOA: 06/30/2013  PCP: Nelwyn Salisbury, MD  Admit date: 06/30/2013 Discharge date: 07/02/2013  Time spent: 45 minutes  Recommendations for Outpatient Follow-up:  1. Follow up with primary MD in 1 week   Recommendations for primary care physician for things to follow:  Sugar log check, insulin adjustment  Discharge Diagnoses:  Principal Problem:   Type 1 diabetes, uncontrolled, with neuropathy Active Problems:   GERD   Painful diabetic neuropathy   Gastroparesis due to DM   Abdominal  pain, other specified site   Hypokalemia  Discharge Condition: stable  Diet recommendation: diabetic  Filed Weights   06/30/13 1300  Weight: 56.8 kg (125 lb 3.5 oz)   History of present illness:  Jason [name] is a 27 y.o. male with past medical history significant for diabetes mellitus with diabetic gastroparesis & neuropathy, GERDpresents with above complaints. He states that he has had worsening abdominal pain along with nausea vomiting for the past 2-3 days. He reports that the abdominal pain is diffuse,sharp and crampy 10/10 intensity.he has also had associated nausea or vomiting, nonbloody. He Denies diarrhea, fevers, dysuria melena and no hematochezia. he was seen in Dr. Lauro Franklin office today and because he has been unable to keep anything down [facility] for further evaluation and management, and GI to follow.  Hospital Course:  Gastroparesis due to DM/Abdominal pain -patient was started on Reglan, antiemetics, and analgesics-IV/by mouth for pain management. He significantly improved within 12 hours and by discharge he was able to ear diabetic diet and tolerating sold food well.  Painful diabetic neuropathy -continue outpatient medications  Diabetes mellitus - He is on Lantus 32 U nightly at home and reports very frequent (few times per week) low blood sugars in the morning and reports that he  has times when he is unconscious. Decreased his Lantus to 10 U with improvement in his fasting blood sugars. He is to continue that at home along with his sliding scale. Advised patient to follow up with PCP in 1 week. Hypokalemia - requiring repletion, K on discharge 3.7. He is on supplements at home but has not been taking them for a while. Will give low dose K supplements and advised to follow up with PCP within a week. Patient expressed understanding.   Procedures:  none   Consultations:  none  Discharge Exam: Filed Vitals:   07/01/13 0551 07/01/13 1317 07/01/13 2112 07/02/13 0506  BP: 134/96 127/55 103/64 135/89  Pulse: 90 92 91 91  Temp: 98.3 F (36.8 C) 98.5 F (36.9 C) 98.4 F (36.9 C) 99 F (37.2 C)  TempSrc: Oral Oral Oral Oral  Resp: 14 16 12 14   Height:      Weight:      SpO2: 100% 100% 100% 100%   General: NAD Cardiovascular: RRR Respiratory: CTA biL  Discharge Instructions    Medication List    STOP taking these medications       metoCLOPramide 10 MG tablet  Commonly known as:  REGLAN  Replaced by:  metoCLOPramide 10 MG/10ML Soln      TAKE these medications       esomeprazole 20 MG capsule  Commonly known as:  NEXIUM  Take 20 mg by mouth daily before breakfast.     insulin aspart 100 UNIT/ML injection  Commonly known as:  novoLOG  Inject 1-17 Units into the skin 4 (four) times daily. Per sliding scale     insulin  glargine 100 UNIT/ML injection  Commonly known as:  LANTUS  Inject 0.1 mLs (10 Units total) into the skin at bedtime.     metoCLOPramide 10 MG/10ML Soln  Commonly known as:  REGLAN  Take 10 mLs (10 mg total) by mouth 3 (three) times daily as needed.     ondansetron 8 MG tablet  Commonly known as:  ZOFRAN  Take 1 tablet (8 mg total) by mouth every 12 (twelve) hours as needed for nausea.     oxyCODONE-acetaminophen 10-325 MG per tablet  Commonly known as:  PERCOCET  Take 1 tablet by mouth every 6 (six) hours as needed for pain.      OXYCONTIN 15 mg T12a 12 hr tablet  Generic drug:  OxyCODONE  Take 15 mg by mouth every 12 (twelve) hours.     potassium chloride 10 MEQ tablet  Commonly known as:  K-DUR  Take 1 tablet (10 mEq total) by mouth daily.     pregabalin 100 MG capsule  Commonly known as:  LYRICA  Take 1 capsule (100 mg total) by mouth 3 (three) times daily.     ZEGERID OTC 20-1100 MG Caps capsule  Generic drug:  Omeprazole-Sodium Bicarbonate  Take 1 capsule by mouth at bedtime.     zolpidem 10 MG tablet  Commonly known as:  AMBIEN  Take 1 tablet (10 mg total) by mouth at bedtime as needed for sleep.           Follow-up Information   Follow up with FRY,STEPHEN A, MD. Schedule an appointment as soon as possible for a visit in 1 week.   Specialty:  Family Medicine   Contact information:   [address] [place] [address] [phone]      The results of significant diagnostics from this hospitalization (including imaging, microbiology, ancillary and laboratory) are listed below for reference.    Significant Diagnostic Studies: Dg Shoulder 1v Left  06/30/2013   CLINICAL DATA:  Fall.  Left shoulder pain.  EXAM: LEFT SHOULDER - 1 VIEW  COMPARISON:  None.  FINDINGS: Only a single view is provided. No fracture is identified. No evidence of dislocation is seen although an axillary or Y-view is needed to confirm position of the humeral head. The acromioclavicular joint is intact. Imaged left lung and ribs appear normal.  IMPRESSION: Negative single view of the left shoulder.   Electronically Signed   By: Drusilla Kanner M.D.   On: 06/30/2013 23:41   Acute Abdominal Series  06/30/2013   CLINICAL DATA:  Abdominal pain. Nausea and vomiting. Diabetic with current history of gastroparesis.  EXAM: ACUTE ABDOMEN SERIES (ABDOMEN 2 VIEW & CHEST 1 VIEW)  COMPARISON:  Acute abdomen series 06/27/2013. Two-view chest x-ray 04/18/2013. CT abdomen and pelvis 04/11/2013.  FINDINGS: Interval development of  a dilated loop of small bowel in the mid abdomen since the examination 3 days ago. No evidence of bowel distention elsewhere. No evidence of free air on the lateral decubitus image. Numerous phleboliths in the left side of the pelvis and solitary phlebolith in the right side of the pelvis as noted on the prior examinations. No visible opaque urinary tract calculi. Regional skeleton unremarkable apart from a curvilinear calcification at the symphysis pubis which is unchanged and may reflect degenerative changes or an old avulsion type injury.  Cardiomediastinal silhouette unremarkable and unchanged. Lungs clear. Bronchovascular markings normal. Pulmonary vascularity normal. No visible pleural effusions. No pneumothorax. Old healed fracture involving the distal right clavicle again noted.  IMPRESSION:  1. Interval development of an isolated dilated loop of small bowel in the mid abdomen since the examination 3 days ago. This may reflect early partial small bowel obstruction or a sentinel loop in a patient with inflammatory abdominal disease such as pancreatitis. 2. No free intraperitoneal air. 3. No acute cardiopulmonary disease.   Electronically Signed   By: Hulan Saas   On: 06/30/2013 20:19   Microbiology: Recent Results (from the past 240 hour(s))  MRSA PCR SCREENING     Status: None   Collection Time    06/30/13  4:49 PM      Result Value Range Status   MRSA by PCR NEGATIVE  NEGATIVE Final   Comment:            The GeneXpert MRSA Assay (FDA     approved for NASAL specimens     only), is one component of a     comprehensive MRSA colonization     surveillance program. It is not     intended to diagnose MRSA     infection nor to guide or     monitor treatment for     MRSA infections.  URINE CULTURE     Status: None   Collection Time    06/30/13  8:05 PM      Result Value Range Status   Specimen Description URINE, CLEAN CATCH   Final   Special Requests NONE   Final   Culture  Setup Time      Final   Value: 07/01/2013 01:29     Performed at [facility] Count     Final   Value: NO GROWTH     Performed at [facility]   Culture     Final   Value: NO GROWTH     Performed at [facility]   Report Status 07/01/2013 FINAL   Final    Labs: Basic Metabolic Panel:  Recent Labs Lab 06/26/13 1350 06/29/13 2115 06/30/13 1015 06/30/13 1531 07/01/13 0440 07/02/13 0437  NA 139 137  --  135 136  --   K 3.9 3.0*  --  2.9* 2.4* 3.7  CL 97 99  --  99 98  --   CO2 28 26  --  25 30  --   GLUCOSE 488* 48* 250* 134* 81  --   BUN 7 9  --  5* <3*  --   CREATININE 0.70 0.80  --  0.73 0.73  --   CALCIUM 10.0 9.7  --  8.8 8.5  --    Liver Function Tests:  Recent Labs Lab 06/26/13 1350 06/29/13 2115 06/30/13 1531  AST 23 11 13   ALT 15 9 10   ALKPHOS 106 86 77  BILITOT 0.9 1.3* 0.7  PROT 7.7 7.0 6.6  ALBUMIN 3.8 3.5 3.1*    Recent Labs Lab 06/29/13 2115 06/30/13 1530  LIPASE 9* 12   CBC:  Recent Labs Lab 06/26/13 1303 06/29/13 2115 06/30/13 1530  WBC 7.1 12.0* 8.6  NEUTROABS 3.9 6.8  --   HGB 12.2* 11.5* 11.8*  HCT 36.3* 33.0* 34.4*  MCV 80.0 77.5* 77.7*  PLT 258 360 329   CBG:  Recent Labs Lab 07/01/13 1720 07/01/13 1950 07/02/13 0001 07/02/13 0449 07/02/13 0737  GLUCAP 213* 168* 160* 124* 85   Signed:  GHERGHE, COSTIN  [facility] 07/02/2013, 6:17 PM

## 2013-07-06 ENCOUNTER — Emergency Department (HOSPITAL_BASED_OUTPATIENT_CLINIC_OR_DEPARTMENT_OTHER)
Admission: EM | Admit: 2013-07-06 | Discharge: 2013-07-06 | Disposition: A | Discharge status: Home / Self Care | Payer: Medicaid Other | Attending: Emergency Medicine | Admitting: Emergency Medicine

## 2013-07-06 ENCOUNTER — Encounter (HOSPITAL_BASED_OUTPATIENT_CLINIC_OR_DEPARTMENT_OTHER): Payer: Self-pay

## 2013-07-06 ENCOUNTER — Emergency Department (HOSPITAL_BASED_OUTPATIENT_CLINIC_OR_DEPARTMENT_OTHER): Payer: Medicaid Other

## 2013-07-06 DIAGNOSIS — W1809XA Striking against other object with subsequent fall, initial encounter: Secondary | ICD-10-CM | POA: Insufficient documentation

## 2013-07-06 DIAGNOSIS — Z8669 Personal history of other diseases of the nervous system and sense organs: Secondary | ICD-10-CM | POA: Insufficient documentation

## 2013-07-06 DIAGNOSIS — Z79899 Other long term (current) drug therapy: Secondary | ICD-10-CM | POA: Insufficient documentation

## 2013-07-06 DIAGNOSIS — S42033A Displaced fracture of lateral end of unspecified clavicle, initial encounter for closed fracture: Secondary | ICD-10-CM | POA: Insufficient documentation

## 2013-07-06 DIAGNOSIS — Y939 Activity, unspecified: Secondary | ICD-10-CM | POA: Insufficient documentation

## 2013-07-06 DIAGNOSIS — E1142 Type 2 diabetes mellitus with diabetic polyneuropathy: Secondary | ICD-10-CM | POA: Insufficient documentation

## 2013-07-06 DIAGNOSIS — E1149 Type 2 diabetes mellitus with other diabetic neurological complication: Secondary | ICD-10-CM | POA: Insufficient documentation

## 2013-07-06 DIAGNOSIS — F17200 Nicotine dependence, unspecified, uncomplicated: Secondary | ICD-10-CM | POA: Insufficient documentation

## 2013-07-06 DIAGNOSIS — Z8614 Personal history of Methicillin resistant Staphylococcus aureus infection: Secondary | ICD-10-CM | POA: Insufficient documentation

## 2013-07-06 DIAGNOSIS — Y9229 Other specified public building as the place of occurrence of the external cause: Secondary | ICD-10-CM | POA: Insufficient documentation

## 2013-07-06 DIAGNOSIS — Z794 Long term (current) use of insulin: Secondary | ICD-10-CM | POA: Insufficient documentation

## 2013-07-06 DIAGNOSIS — S42002A Fracture of unspecified part of left clavicle, initial encounter for closed fracture: Secondary | ICD-10-CM

## 2013-07-06 DIAGNOSIS — J45909 Unspecified asthma, uncomplicated: Secondary | ICD-10-CM | POA: Insufficient documentation

## 2013-07-06 DIAGNOSIS — K219 Gastro-esophageal reflux disease without esophagitis: Secondary | ICD-10-CM | POA: Insufficient documentation

## 2013-07-06 MED ORDER — OXYCODONE-ACETAMINOPHEN 10-325 MG PO TABS: 1.0000 | ORAL_TABLET | Freq: Four times a day (QID) | ORAL | Status: DC | PRN

## 2013-07-06 MED ORDER — OXYCODONE-ACETAMINOPHEN 5-325 MG PO TABS
2.0000 | ORAL_TABLET | Freq: Once | ORAL | Status: AC
  Administered 2013-07-06: 2 via ORAL
  Filled 2013-07-06: qty 2

## 2013-07-06 MED ORDER — OXYCODONE-ACETAMINOPHEN 5-325 MG PO TABS: 2.0000 | ORAL_TABLET | ORAL | Status: DC | PRN

## 2013-07-06 NOTE — ED Notes (Signed)
Left shoulder pain that occurred after sustaining a fall in the hospital last week.

## 2013-07-06 NOTE — ED Provider Notes (Signed)
History/physical exam/procedure(s) were performed by non-physician practitioner and as supervising physician I was immediately available for consultation/collaboration. I have reviewed all notes and am in agreement with care and plan.   Hilario Quarry, MD 07/06/13 [phone]

## 2013-07-06 NOTE — ED Notes (Signed)
Done by Stark Jock

## 2013-07-06 NOTE — ED Notes (Signed)
Encouraged Pt. To use call bell and to not get out of bed due to his use of Pain meds he used at the home.

## 2013-07-06 NOTE — ED Notes (Signed)
Pt. Reports he is going home with his father.

## 2013-07-06 NOTE — ED Notes (Signed)
Jason [name] spoke with the Pt. About caring for the L shoulder.  Pt. Asking continuously about his pain and pain meds.

## 2013-07-06 NOTE — ED Provider Notes (Signed)
CSN: [id number]     Arrival date & time 07/06/13  1601 History   First MD Initiated Contact with Patient 07/06/13 1648     Chief Complaint  Patient presents with  . Shoulder Pain   (Consider location/radiation/quality/duration/timing/severity/associated sxs/prior Treatment) Patient is a 27 y.o. male presenting with shoulder pain. The history is provided by the patient. No language interpreter was used.  Shoulder Pain This is a new problem. The current episode started in the past 7 days. The problem occurs constantly. The problem has been unchanged. Associated symptoms include myalgias. Pertinent negatives include no joint swelling. Nothing aggravates the symptoms. He has tried nothing for the symptoms. The treatment provided moderate relief.  Pt was hospitalized at [facility].  Pt reports he fell and hit his shoulder.   Pt was advised to have repeat xrays.   Pt has gastroparesis and diabeic neuropathy.   Pt is on chronic pain management  Past Medical History  Diagnosis Date  . Diabetes mellitus   . GERD (gastroesophageal reflux disease)   . Asthma   . Hx MRSA infection     on face  . Gastroparesis   . Diabetic neuropathy   . Seizures    Past Surgical History  Procedure Laterality Date  . Tonsillectomy     Family History  Problem Relation Age of Onset  . Diabetes Father   . Hypertension Father   . Asthma      fhx  . Hypertension      fhx  . Stroke      fhx  . Heart disease Mother    History  Substance Use Topics  . Smoking status: Current Some Day Smoker    Types: Cigars  . Smokeless tobacco: Never Used     Comment: e-cigarette  . Alcohol Use: Yes     Comment: Rarely    Review of Systems  Musculoskeletal: Positive for myalgias. Negative for joint swelling.  All other systems reviewed and are negative.    Allergies  Cefuroxime axetil; Penicillins; Tessalon; and Shellfish allergy  Home Medications   Current Outpatient Rx  Name  Route  Sig  Dispense  Refill   . OxyCODONE (OXYCONTIN) 15 mg T12A   Oral   Take 15 mg by mouth every 12 (twelve) hours.         Marland Kitchen esomeprazole (NEXIUM) 20 MG capsule   Oral   Take 20 mg by mouth daily before breakfast.         . insulin aspart (NOVOLOG) 100 UNIT/ML injection   Subcutaneous   Inject 1-17 Units into the skin 4 (four) times daily. Per sliding scale         . insulin glargine (LANTUS) 100 UNIT/ML injection   Subcutaneous   Inject 0.1 mLs (10 Units total) into the skin at bedtime.   10 mL   12   . metoCLOPramide (REGLAN) 10 MG/10ML SOLN   Oral   Take 10 mLs (10 mg total) by mouth 3 (three) times daily as needed.   1200 mL   1   . Omeprazole-Sodium Bicarbonate (ZEGERID OTC) 20-1100 MG CAPS   Oral   Take 1 capsule by mouth at bedtime.          . ondansetron (ZOFRAN) 8 MG tablet   Oral   Take 1 tablet (8 mg total) by mouth every 12 (twelve) hours as needed for nausea.   20 tablet   0   . oxyCODONE-acetaminophen (PERCOCET) 10-325 MG per tablet   Oral  Take 1 tablet by mouth every 6 (six) hours as needed for pain.   30 tablet   0   . potassium chloride (K-DUR) 10 MEQ tablet   Oral   Take 1 tablet (10 mEq total) by mouth daily.   7 tablet   0   . pregabalin (LYRICA) 100 MG capsule   Oral   Take 1 capsule (100 mg total) by mouth 3 (three) times daily.   90 capsule   0   . zolpidem (AMBIEN) 10 MG tablet   Oral   Take 1 tablet (10 mg total) by mouth at bedtime as needed for sleep.   15 tablet   0    BP 94/63  Pulse 105  Temp(Src) 98.8 F (37.1 C) (Oral)  Resp 18  Ht 5\' 8"  (1.727 m)  Wt 125 lb (56.7 kg)  BMI 19.01 kg/m2  SpO2 100% Physical Exam  Nursing note and vitals reviewed. Constitutional: He is oriented to person, place, and time. He appears well-developed and well-nourished.  HENT:  Head: Normocephalic and atraumatic.  Cardiovascular: Normal rate and normal heart sounds.   Pulmonary/Chest: Effort normal.  Abdominal: Soft.  Musculoskeletal: He exhibits  tenderness.  Neurological: He is alert and oriented to person, place, and time. He has normal reflexes.  Skin: Skin is warm.  Psychiatric: He has a normal mood and affect.    ED Course  Procedures (including critical care time) Labs Review Labs Reviewed - No data to display Imaging Review No results found.  MDM   1. Clavicle fracture, left, closed, initial encounter    Nondisplaced clavicle fracture.   Pt given 2 percocet po   Lonia Skinner [place], [place] 07/06/13 1823

## 2013-08-05 ENCOUNTER — Encounter: Payer: Self-pay | Admitting: Family Medicine

## 2013-08-05 ENCOUNTER — Ambulatory Visit (INDEPENDENT_AMBULATORY_CARE_PROVIDER_SITE_OTHER): Payer: Medicaid Other | Admitting: Family Medicine

## 2013-08-05 VITALS — BP 102/60 | HR 100 | Temp 98.3°F | Wt 125.0 lb

## 2013-08-05 DIAGNOSIS — E1143 Type 2 diabetes mellitus with diabetic autonomic (poly)neuropathy: Secondary | ICD-10-CM

## 2013-08-05 DIAGNOSIS — E1142 Type 2 diabetes mellitus with diabetic polyneuropathy: Secondary | ICD-10-CM

## 2013-08-05 DIAGNOSIS — E1040 Type 1 diabetes mellitus with diabetic neuropathy, unspecified: Secondary | ICD-10-CM

## 2013-08-05 DIAGNOSIS — E1140 Type 2 diabetes mellitus with diabetic neuropathy, unspecified: Secondary | ICD-10-CM

## 2013-08-05 DIAGNOSIS — K3184 Gastroparesis: Secondary | ICD-10-CM

## 2013-08-05 DIAGNOSIS — Z23 Encounter for immunization: Secondary | ICD-10-CM

## 2013-08-05 DIAGNOSIS — S42309S Unspecified fracture of shaft of humerus, unspecified arm, sequela: Secondary | ICD-10-CM

## 2013-08-05 DIAGNOSIS — S42022S Displaced fracture of shaft of left clavicle, sequela: Secondary | ICD-10-CM

## 2013-08-05 DIAGNOSIS — E1049 Type 1 diabetes mellitus with other diabetic neurological complication: Secondary | ICD-10-CM

## 2013-08-05 DIAGNOSIS — R112 Nausea with vomiting, unspecified: Secondary | ICD-10-CM

## 2013-08-05 NOTE — Addendum Note (Signed)
Addended by: Aniceto Boss A on: 08/05/2013 02:42 PM   Modules accepted: Orders
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
## 2013-08-06 ENCOUNTER — Encounter: Payer: Self-pay | Admitting: Family Medicine

## 2013-08-07 MED ORDER — OXYCODONE-ACETAMINOPHEN 10-325 MG PO TABS: 1.0000 | ORAL_TABLET | Freq: Four times a day (QID) | ORAL | Status: DC | PRN

## 2013-08-07 NOTE — Telephone Encounter (Signed)
done

## 2013-08-08 ENCOUNTER — Encounter: Payer: Self-pay | Admitting: Family Medicine

## 2013-08-10 NOTE — Telephone Encounter (Signed)
We can handle that. Call in Viagra 100 mg to use prn, #10 with 11 rf

## 2013-08-11 MED ORDER — SILDENAFIL CITRATE 100 MG PO TABS
100.0000 mg | ORAL_TABLET | Freq: Every day | ORAL | Status: DC | PRN
Start: 2013-08-11 — End: 2013-12-24

## 2013-08-21 ENCOUNTER — Encounter (HOSPITAL_BASED_OUTPATIENT_CLINIC_OR_DEPARTMENT_OTHER): Payer: Self-pay | Admitting: Emergency Medicine

## 2013-08-21 ENCOUNTER — Emergency Department (HOSPITAL_BASED_OUTPATIENT_CLINIC_OR_DEPARTMENT_OTHER)
Admission: EM | Admit: 2013-08-21 | Discharge: 2013-08-21 | Disposition: A | Discharge status: Home / Self Care | Payer: Medicaid Other | Attending: Emergency Medicine | Admitting: Emergency Medicine

## 2013-08-21 DIAGNOSIS — E1149 Type 2 diabetes mellitus with other diabetic neurological complication: Secondary | ICD-10-CM | POA: Insufficient documentation

## 2013-08-21 DIAGNOSIS — Z880 Allergy status to penicillin: Secondary | ICD-10-CM | POA: Insufficient documentation

## 2013-08-21 DIAGNOSIS — E1142 Type 2 diabetes mellitus with diabetic polyneuropathy: Secondary | ICD-10-CM | POA: Insufficient documentation

## 2013-08-21 DIAGNOSIS — E1049 Type 1 diabetes mellitus with other diabetic neurological complication: Secondary | ICD-10-CM | POA: Insufficient documentation

## 2013-08-21 DIAGNOSIS — G40909 Epilepsy, unspecified, not intractable, without status epilepticus: Secondary | ICD-10-CM | POA: Insufficient documentation

## 2013-08-21 DIAGNOSIS — Z8614 Personal history of Methicillin resistant Staphylococcus aureus infection: Secondary | ICD-10-CM | POA: Insufficient documentation

## 2013-08-21 DIAGNOSIS — L97509 Non-pressure chronic ulcer of other part of unspecified foot with unspecified severity: Secondary | ICD-10-CM | POA: Insufficient documentation

## 2013-08-21 DIAGNOSIS — Z794 Long term (current) use of insulin: Secondary | ICD-10-CM | POA: Insufficient documentation

## 2013-08-21 DIAGNOSIS — Z792 Long term (current) use of antibiotics: Secondary | ICD-10-CM | POA: Insufficient documentation

## 2013-08-21 DIAGNOSIS — L97521 Non-pressure chronic ulcer of other part of left foot limited to breakdown of skin: Secondary | ICD-10-CM

## 2013-08-21 DIAGNOSIS — Z79899 Other long term (current) drug therapy: Secondary | ICD-10-CM | POA: Insufficient documentation

## 2013-08-21 DIAGNOSIS — E1069 Type 1 diabetes mellitus with other specified complication: Secondary | ICD-10-CM | POA: Insufficient documentation

## 2013-08-21 DIAGNOSIS — F17200 Nicotine dependence, unspecified, uncomplicated: Secondary | ICD-10-CM | POA: Insufficient documentation

## 2013-08-21 DIAGNOSIS — K219 Gastro-esophageal reflux disease without esophagitis: Secondary | ICD-10-CM | POA: Insufficient documentation

## 2013-08-21 DIAGNOSIS — J45909 Unspecified asthma, uncomplicated: Secondary | ICD-10-CM | POA: Insufficient documentation

## 2013-08-21 LAB — GLUCOSE, CAPILLARY: Glucose-Capillary: 213 mg/dL — ABNORMAL HIGH (ref 70–99)

## 2013-08-21 NOTE — ED Notes (Signed)
Pt states when eating food feels like it gets stuck at times

## 2013-08-21 NOTE — ED Notes (Addendum)
Ulcer on out side of left foot denies drainage  Increased swelling w odor

## 2013-08-21 NOTE — ED Notes (Signed)
Pt c/o diabetic wound to left foot x 1 month

## 2013-08-21 NOTE — ED Provider Notes (Signed)
I have reviewed the report and personally reviewed the above radiology studies.    Kimia Finan S Rasheed Welty, MD 08/21/13 2135 

## 2013-08-21 NOTE — ED Provider Notes (Signed)
CSN: [id number]     Arrival date & time 08/21/13  1925 History  This chart was scribed for non-physician practitioner Elpidio Anis, PA-C working with Hilario Quarry, MD by Joaquin Music, ED Scribe. This patient was seen in room MH02/MH02 and the patient's care was started at 7:49 PM .   Chief Complaint  Patient presents with  . Diabetic Ulcer   The history is provided by the patient. No language interpreter was used.   HPI Comments: Jason [name] is a 27 y.o. male with a hx of Type 1 DM who presents to the Emergency Department complaining of diabetic ulcer. Pt states his L foot has been swollen for the past 3 days and states it has worsened today. Pt denies being on his feet anymore than normal. Pt states he is unsure if he has had a fever.   Pt states he sees Dr. Vincente Poli for his DM. He states he has been seen by a diabetic podiatrist before but is  not currently receiving tx. Pt states he has been taking 300 mg of doxycycline for the past 2 weeks. He states he has about 3 more days.  Past Medical History  Diagnosis Date  . Diabetes mellitus   . GERD (gastroesophageal reflux disease)   . Asthma   . Hx MRSA infection     on face  . Gastroparesis   . Diabetic neuropathy   . Seizures    Past Surgical History  Procedure Laterality Date  . Tonsillectomy     Family History  Problem Relation Age of Onset  . Diabetes Father   . Hypertension Father   . Asthma      fhx  . Hypertension      fhx  . Stroke      fhx  . Heart disease Mother    History  Substance Use Topics  . Smoking status: Current Some Day Smoker    Types: Cigars  . Smokeless tobacco: Never Used     Comment: e-cigarette  . Alcohol Use: Yes     Comment: Rarely    Review of Systems  All other systems reviewed and are negative.    Allergies  Cefuroxime axetil; Penicillins; Tessalon; and Shellfish allergy  Home Medications   Current Outpatient Rx  Name  Route  Sig  Dispense  Refill  .  doxycycline (ADOXA) 100 MG tablet   Oral   Take 100 mg by mouth 2 (two) times daily.         Marland Kitchen esomeprazole (NEXIUM) 20 MG capsule   Oral   Take 20 mg by mouth daily before breakfast.         . insulin aspart (NOVOLOG) 100 UNIT/ML injection   Subcutaneous   Inject 1-17 Units into the skin 4 (four) times daily. Per sliding scale         . insulin glargine (LANTUS) 100 UNIT/ML injection   Subcutaneous   Inject 10 Units into the skin at bedtime. Pt has been taking 25 units at night         . metoCLOPramide (REGLAN) 10 MG/10ML SOLN   Oral   Take 10 mLs (10 mg total) by mouth 3 (three) times daily as needed.   1200 mL   1   . Omeprazole-Sodium Bicarbonate (ZEGERID OTC) 20-1100 MG CAPS   Oral   Take 1 capsule by mouth at bedtime.          . ondansetron (ZOFRAN) 8 MG tablet   Oral  Take 1 tablet (8 mg total) by mouth every 12 (twelve) hours as needed for nausea.   20 tablet   0   . OxyCODONE (OXYCONTIN) 15 mg T12A   Oral   Take 15 mg by mouth every 12 (twelve) hours.         Marland Kitchen oxyCODONE-acetaminophen (PERCOCET) 10-325 MG per tablet   Oral   Take 1 tablet by mouth every 6 (six) hours as needed for pain.   60 tablet   0   . potassium chloride (K-DUR) 10 MEQ tablet   Oral   Take 1 tablet (10 mEq total) by mouth daily.   7 tablet   0   . pregabalin (LYRICA) 100 MG capsule   Oral   Take 1 capsule (100 mg total) by mouth 3 (three) times daily.   90 capsule   0   . sildenafil (VIAGRA) 100 MG tablet   Oral   Take 1 tablet (100 mg total) by mouth daily as needed for erectile dysfunction.   10 tablet   11   . zolpidem (AMBIEN) 10 MG tablet   Oral   Take 1 tablet (10 mg total) by mouth at bedtime as needed for sleep.   15 tablet   0    Triage Vitals:BP 124/84  Pulse 98  Temp(Src) 98.8 F (37.1 C) (Oral)  Resp 16  Wt 125 lb (56.7 kg)  SpO2 100%  Physical Exam  Nursing note and vitals reviewed. Constitutional: He is oriented to person, place, and  time. He appears well-developed and well-nourished.  HENT:  Head: Normocephalic and atraumatic.  Eyes: Conjunctivae are normal. Pupils are equal, round, and reactive to light.  Neck: Normal range of motion. Neck supple.  Pulmonary/Chest: Effort normal.  Abdominal: He exhibits no distension.  Musculoskeletal: Normal range of motion.  L foot moderately swollen without redness or warmth Circular ulcer lateral forefoot measuring 2cm diameter. No active drainage or malodor. Similar ulcer to proximal and dorsal forefoot that is smaller in size.   Neurological: He is alert and oriented to person, place, and time. He has normal reflexes.  Skin: Skin is warm and dry. No erythema.  Psychiatric: He has a normal mood and affect.    ED Course  Procedures  DIAGNOSTIC STUDIES: Oxygen Saturation is 100% on RA, normal by my interpretation.    COORDINATION OF CARE: 7:55 PM-Discussed treatment plan which includes labs. Pt agreed to plan.   Labs Review Labs Reviewed - No data to display Imaging Review No results found.  EKG Interpretation   None       MDM  No diagnosis found. 1. Diabetic foot ulcerations  Patient is well appearing. No acute changes to foot - no redness, or warmth to the touch. Afebrile. He is currently on appropriate antibiotics and has regularly prescribed pain medications. Feel he is stable for discharge home and close PCP follow up.  I personally performed the services described in this documentation, which was scribed in my presence. The recorded information has been reviewed and is accurate.     Arnoldo Hooker, PA-C 08/21/13 2022

## 2013-08-23 ENCOUNTER — Encounter: Payer: Self-pay | Admitting: Family Medicine

## 2013-08-25 ENCOUNTER — Encounter: Payer: Self-pay | Admitting: *Deleted

## 2013-08-26 ENCOUNTER — Encounter: Payer: Self-pay | Admitting: Family Medicine

## 2013-08-26 ENCOUNTER — Ambulatory Visit (INDEPENDENT_AMBULATORY_CARE_PROVIDER_SITE_OTHER): Payer: Medicaid Other | Admitting: Family Medicine

## 2013-08-26 VITALS — BP 120/78 | Temp 98.1°F | Wt 139.0 lb

## 2013-08-26 DIAGNOSIS — R609 Edema, unspecified: Secondary | ICD-10-CM

## 2013-08-26 DIAGNOSIS — E1040 Type 1 diabetes mellitus with diabetic neuropathy, unspecified: Secondary | ICD-10-CM

## 2013-08-26 DIAGNOSIS — E1140 Type 2 diabetes mellitus with diabetic neuropathy, unspecified: Secondary | ICD-10-CM

## 2013-08-26 DIAGNOSIS — E1149 Type 2 diabetes mellitus with other diabetic neurological complication: Secondary | ICD-10-CM

## 2013-08-26 DIAGNOSIS — R600 Localized edema: Secondary | ICD-10-CM | POA: Insufficient documentation

## 2013-08-26 DIAGNOSIS — E1049 Type 1 diabetes mellitus with other diabetic neurological complication: Secondary | ICD-10-CM

## 2013-08-26 DIAGNOSIS — E1142 Type 2 diabetes mellitus with diabetic polyneuropathy: Secondary | ICD-10-CM

## 2013-08-26 MED ORDER — FUROSEMIDE 20 MG PO TABS: 20.0000 mg | ORAL_TABLET | Freq: Every day | ORAL | Status: DC

## 2013-08-26 NOTE — Progress Notes (Signed)
  Subjective:    Patient ID: Jason [name], male    DOB: 05/03/1986, 27 y.o.   MRN: [id number]  HPI Here for swelling in the lower legs and feet. No SOB. He is applying Silvadene cream to the ulcerated areas on his legs and feet.    Review of Systems  Constitutional: Negative.   Respiratory: Negative.   Cardiovascular: Positive for leg swelling. Negative for chest pain and palpitations.       Objective:   Physical Exam  Constitutional: He appears well-developed and well-nourished.  Cardiovascular: Normal rate, regular rhythm, normal heart sounds and intact distal pulses.   Pulmonary/Chest: Effort normal and breath sounds normal. No respiratory distress. He has no wheezes. He has no rales.  Musculoskeletal:  2+ edema in both lower legs and feet, no warmth or erythema           Assessment & Plan:  Add Lasix 20 mg daily. Recheck prn. He has another telephone conference about his disability application on 09-14-13.

## 2013-09-13 ENCOUNTER — Emergency Department (HOSPITAL_BASED_OUTPATIENT_CLINIC_OR_DEPARTMENT_OTHER)
Admission: EM | Admit: 2013-09-13 | Discharge: 2013-09-13 | Disposition: A | Discharge status: Home / Self Care | Payer: Medicaid Other | Attending: Emergency Medicine | Admitting: Emergency Medicine

## 2013-09-13 ENCOUNTER — Encounter (HOSPITAL_BASED_OUTPATIENT_CLINIC_OR_DEPARTMENT_OTHER): Payer: Self-pay | Admitting: Emergency Medicine

## 2013-09-13 DIAGNOSIS — T25232A Burn of second degree of left toe(s) (nail), initial encounter: Secondary | ICD-10-CM

## 2013-09-13 DIAGNOSIS — Z8614 Personal history of Methicillin resistant Staphylococcus aureus infection: Secondary | ICD-10-CM | POA: Insufficient documentation

## 2013-09-13 DIAGNOSIS — Z792 Long term (current) use of antibiotics: Secondary | ICD-10-CM | POA: Insufficient documentation

## 2013-09-13 DIAGNOSIS — E1149 Type 2 diabetes mellitus with other diabetic neurological complication: Secondary | ICD-10-CM | POA: Insufficient documentation

## 2013-09-13 DIAGNOSIS — T25231A Burn of second degree of right toe(s) (nail), initial encounter: Secondary | ICD-10-CM

## 2013-09-13 DIAGNOSIS — Z880 Allergy status to penicillin: Secondary | ICD-10-CM | POA: Insufficient documentation

## 2013-09-13 DIAGNOSIS — K219 Gastro-esophageal reflux disease without esophagitis: Secondary | ICD-10-CM | POA: Insufficient documentation

## 2013-09-13 DIAGNOSIS — Z79899 Other long term (current) drug therapy: Secondary | ICD-10-CM | POA: Insufficient documentation

## 2013-09-13 DIAGNOSIS — Y92009 Unspecified place in unspecified non-institutional (private) residence as the place of occurrence of the external cause: Secondary | ICD-10-CM | POA: Insufficient documentation

## 2013-09-13 DIAGNOSIS — F17200 Nicotine dependence, unspecified, uncomplicated: Secondary | ICD-10-CM | POA: Insufficient documentation

## 2013-09-13 DIAGNOSIS — Y939 Activity, unspecified: Secondary | ICD-10-CM | POA: Insufficient documentation

## 2013-09-13 DIAGNOSIS — G589 Mononeuropathy, unspecified: Secondary | ICD-10-CM | POA: Insufficient documentation

## 2013-09-13 DIAGNOSIS — J45909 Unspecified asthma, uncomplicated: Secondary | ICD-10-CM | POA: Insufficient documentation

## 2013-09-13 DIAGNOSIS — T25239A Burn of second degree of unspecified toe(s) (nail), initial encounter: Secondary | ICD-10-CM | POA: Insufficient documentation

## 2013-09-13 DIAGNOSIS — Z794 Long term (current) use of insulin: Secondary | ICD-10-CM | POA: Insufficient documentation

## 2013-09-13 DIAGNOSIS — T25229A Burn of second degree of unspecified foot, initial encounter: Secondary | ICD-10-CM | POA: Insufficient documentation

## 2013-09-13 DIAGNOSIS — I83009 Varicose veins of unspecified lower extremity with ulcer of unspecified site: Secondary | ICD-10-CM | POA: Insufficient documentation

## 2013-09-13 DIAGNOSIS — X19XXXA Contact with other heat and hot substances, initial encounter: Secondary | ICD-10-CM | POA: Insufficient documentation

## 2013-09-13 DIAGNOSIS — G40909 Epilepsy, unspecified, not intractable, without status epilepticus: Secondary | ICD-10-CM | POA: Insufficient documentation

## 2013-09-13 LAB — GLUCOSE, CAPILLARY: Glucose-Capillary: 476 mg/dL — ABNORMAL HIGH (ref 70–99)

## 2013-09-13 MED ORDER — SILVER SULFADIAZINE 1 % EX CREA
TOPICAL_CREAM | Freq: Once | CUTANEOUS | Status: AC
  Administered 2013-09-13: 15:00:00 via TOPICAL
  Filled 2013-09-13: qty 85

## 2013-09-13 MED ORDER — OXYCODONE-ACETAMINOPHEN 5-325 MG PO TABS
1.0000 | ORAL_TABLET | Freq: Once | ORAL | Status: AC
  Administered 2013-09-13: 1 via ORAL
  Filled 2013-09-13: qty 1

## 2013-09-13 MED ORDER — OXYCODONE-ACETAMINOPHEN 7.5-325 MG PO TABS: 1.0000 | ORAL_TABLET | ORAL | Status: DC | PRN
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
## 2013-09-15 NOTE — ED Provider Notes (Signed)
Medical screening examination/treatment/procedure(s) were performed by non-physician practitioner and as supervising physician I was immediately available for consultation/collaboration.     Nikole Swartzentruber, MD 09/15/13 1542 

## 2013-09-16 ENCOUNTER — Encounter (HOSPITAL_BASED_OUTPATIENT_CLINIC_OR_DEPARTMENT_OTHER): Payer: Medicaid Other | Attending: General Surgery

## 2013-09-16 DIAGNOSIS — Z794 Long term (current) use of insulin: Secondary | ICD-10-CM | POA: Diagnosis not present

## 2013-09-16 DIAGNOSIS — X19XXXA Contact with other heat and hot substances, initial encounter: Secondary | ICD-10-CM | POA: Insufficient documentation

## 2013-09-16 DIAGNOSIS — E109 Type 1 diabetes mellitus without complications: Secondary | ICD-10-CM | POA: Insufficient documentation

## 2013-09-16 DIAGNOSIS — T25229A Burn of second degree of unspecified foot, initial encounter: Secondary | ICD-10-CM | POA: Insufficient documentation

## 2013-09-16 DIAGNOSIS — Z79899 Other long term (current) drug therapy: Secondary | ICD-10-CM | POA: Insufficient documentation
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
## 2013-09-22 ENCOUNTER — Emergency Department (HOSPITAL_BASED_OUTPATIENT_CLINIC_OR_DEPARTMENT_OTHER)
Admission: EM | Admit: 2013-09-22 | Discharge: 2013-09-22 | Disposition: A | Discharge status: Home / Self Care | Payer: Medicaid Other | Source: Home / Self Care | Attending: Emergency Medicine | Admitting: Emergency Medicine

## 2013-09-22 ENCOUNTER — Encounter (HOSPITAL_BASED_OUTPATIENT_CLINIC_OR_DEPARTMENT_OTHER): Payer: Self-pay | Admitting: Emergency Medicine

## 2013-09-22 ENCOUNTER — Emergency Department (HOSPITAL_BASED_OUTPATIENT_CLINIC_OR_DEPARTMENT_OTHER): Payer: Medicaid Other

## 2013-09-22 DIAGNOSIS — J690 Pneumonitis due to inhalation of food and vomit: Secondary | ICD-10-CM

## 2013-09-22 DIAGNOSIS — F17200 Nicotine dependence, unspecified, uncomplicated: Secondary | ICD-10-CM | POA: Insufficient documentation

## 2013-09-22 DIAGNOSIS — R1110 Vomiting, unspecified: Secondary | ICD-10-CM | POA: Insufficient documentation

## 2013-09-22 DIAGNOSIS — K3184 Gastroparesis: Secondary | ICD-10-CM | POA: Insufficient documentation

## 2013-09-22 DIAGNOSIS — E109 Type 1 diabetes mellitus without complications: Secondary | ICD-10-CM | POA: Insufficient documentation

## 2013-09-22 DIAGNOSIS — Z794 Long term (current) use of insulin: Secondary | ICD-10-CM | POA: Insufficient documentation

## 2013-09-22 DIAGNOSIS — Z792 Long term (current) use of antibiotics: Secondary | ICD-10-CM | POA: Insufficient documentation

## 2013-09-22 DIAGNOSIS — Z880 Allergy status to penicillin: Secondary | ICD-10-CM | POA: Insufficient documentation

## 2013-09-22 DIAGNOSIS — E1142 Type 2 diabetes mellitus with diabetic polyneuropathy: Secondary | ICD-10-CM | POA: Insufficient documentation

## 2013-09-22 DIAGNOSIS — E1049 Type 1 diabetes mellitus with other diabetic neurological complication: Secondary | ICD-10-CM | POA: Insufficient documentation

## 2013-09-22 DIAGNOSIS — J45909 Unspecified asthma, uncomplicated: Secondary | ICD-10-CM | POA: Insufficient documentation

## 2013-09-22 DIAGNOSIS — G40909 Epilepsy, unspecified, not intractable, without status epilepticus: Secondary | ICD-10-CM | POA: Insufficient documentation

## 2013-09-22 DIAGNOSIS — Z8614 Personal history of Methicillin resistant Staphylococcus aureus infection: Secondary | ICD-10-CM | POA: Insufficient documentation

## 2013-09-22 DIAGNOSIS — Z79899 Other long term (current) drug therapy: Secondary | ICD-10-CM | POA: Insufficient documentation

## 2013-09-22 DIAGNOSIS — K219 Gastro-esophageal reflux disease without esophagitis: Secondary | ICD-10-CM | POA: Insufficient documentation

## 2013-09-22 LAB — COMPREHENSIVE METABOLIC PANEL
Alkaline Phosphatase: 143 U/L — ABNORMAL HIGH (ref 39–117)
BUN: 9 mg/dL (ref 6–23)
Calcium: 9.2 mg/dL (ref 8.4–10.5)
Creatinine, Ser: 0.9 mg/dL (ref 0.50–1.35)
GFR calc Af Amer: >90 mL/min (ref >90)
Glucose, Bld: 49 mg/dL — ABNORMAL LOW (ref 70–99)
Total Protein: 8.1 g/dL (ref 6.0–8.3)

## 2013-09-22 LAB — CBC WITH DIFFERENTIAL/PLATELET
Basophils Absolute: 0 10*3/uL (ref 0.0–0.1)
Basophils Relative: 0 % (ref 0–1)
HCT: 34.9 % — ABNORMAL LOW (ref 39.0–52.0)
Hemoglobin: 11.5 g/dL — ABNORMAL LOW (ref 13.0–17.0)
Lymphocytes Relative: 7 % — ABNORMAL LOW (ref 12–46)
Monocytes Relative: 8 % (ref 3–12)
Neutro Abs: 27 10*3/uL — ABNORMAL HIGH (ref 1.7–7.7)
Platelets: 622 10*3/uL — ABNORMAL HIGH (ref 150–400)
RBC: 4.37 MIL/uL (ref 4.22–5.81)
RDW: 14.5 % (ref 11.5–15.5)
WBC: 32.2 10*3/uL — ABNORMAL HIGH (ref 4.0–10.5)

## 2013-09-22 LAB — GLUCOSE, CAPILLARY: Glucose-Capillary: 393 mg/dL — ABNORMAL HIGH (ref 70–99)

## 2013-09-22 LAB — LIPASE, BLOOD: Lipase: 11 U/L (ref 11–59)

## 2013-09-22 MED ORDER — IOHEXOL 300 MG/ML  SOLN
100.0000 mL | Freq: Once | INTRAMUSCULAR | Status: AC | PRN
  Administered 2013-09-22: 100 mL via INTRAVENOUS

## 2013-09-22 MED ORDER — LEVOFLOXACIN 750 MG PO TABS: 750.0000 mg | ORAL_TABLET | Freq: Every day | ORAL | Status: DC

## 2013-09-22 MED ORDER — SODIUM CHLORIDE 0.9 % IV BOLUS (SEPSIS)
1000.0000 mL | Freq: Once | INTRAVENOUS | Status: AC
  Administered 2013-09-22: 1000 mL via INTRAVENOUS

## 2013-09-22 MED ORDER — FENTANYL CITRATE 0.05 MG/ML IJ SOLN
100.0000 ug | Freq: Once | INTRAMUSCULAR | Status: AC
  Administered 2013-09-22: 100 ug via INTRAVENOUS
  Filled 2013-09-22: qty 2

## 2013-09-22 MED ORDER — DEXTROSE 50 % IV SOLN
50.0000 mL | Freq: Once | INTRAVENOUS | Status: AC
  Administered 2013-09-22: 50 mL via INTRAVENOUS
  Filled 2013-09-22: qty 50

## 2013-09-22 MED ORDER — METOCLOPRAMIDE HCL 5 MG/ML IJ SOLN
10.0000 mg | Freq: Once | INTRAMUSCULAR | Status: AC
  Administered 2013-09-22: 10 mg via INTRAVENOUS
  Filled 2013-09-22: qty 2

## 2013-09-22 MED ORDER — IOHEXOL 300 MG/ML  SOLN: 50.0000 mL | Freq: Once | INTRAMUSCULAR | Status: DC | PRN

## 2013-09-22 MED ORDER — LEVOFLOXACIN IN D5W 750 MG/150ML IV SOLN
750.0000 mg | Freq: Once | INTRAVENOUS | Status: AC
  Administered 2013-09-22: 750 mg via INTRAVENOUS
  Filled 2013-09-22: qty 150

## 2013-09-22 NOTE — ED Notes (Signed)
Report received, pt care assumed. Pt lying supine, awake and alert, reports n/v x 3 days, and awoke this am with low back and bilateral leg pain. Pt is moaning and restless, md at bedside for exam.

## 2013-09-22 NOTE — ED Notes (Signed)
Pt arrived by ems w c/o back and leg pain,  N/v x 3 days,  Unable to regulate cbg  Per ems cbg 64

## 2013-09-22 NOTE — ED Provider Notes (Signed)
CSN: [id number]     Arrival date & time 09/22/13  4098 History   First MD Initiated Contact with Patient 09/22/13 0703     No chief complaint on file.  (Consider location/radiation/quality/duration/timing/severity/associated sxs/prior Treatment) HPI Comments: Patient is a 27 year old male with history of type 1 diabetes and gastroparesis. He presents today with complaints of severe abdominal pain and vomiting which started last night. This feels similar to his previous episodes of gastroparesis. He denies fevers or chills. His last bowel movement was yesterday and was normal. He has had no diarrhea. His blood sugars have been erratic, EMS found his blood sugar to be in the 50s.  Patient is a 27 y.o. male presenting with vomiting. The history is provided by the patient.  Emesis Severity:  Severe Duration:  8 hours Timing:  Constant Progression:  Worsening Chronicity:  Recurrent Relieved by:  Nothing Worsened by:  Nothing tried Ineffective treatments:  None tried Associated symptoms: abdominal pain     Past Medical History  Diagnosis Date  . Diabetes mellitus   . GERD (gastroesophageal reflux disease)   . Asthma   . Hx MRSA infection     on face  . Gastroparesis   . Diabetic neuropathy   . Seizures    Past Surgical History  Procedure Laterality Date  . Tonsillectomy     Family History  Problem Relation Age of Onset  . Diabetes Father   . Hypertension Father   . Asthma      fhx  . Hypertension      fhx  . Stroke      fhx  . Heart disease Mother    History  Substance Use Topics  . Smoking status: Current Some Day Smoker    Types: Cigars  . Smokeless tobacco: Never Used     Comment: e-cigarette  . Alcohol Use: No     Comment: Rarely    Review of Systems  Gastrointestinal: Positive for vomiting and abdominal pain.  All other systems reviewed and are negative.    Allergies  Cefuroxime axetil; Penicillins; Tessalon; and Shellfish allergy  Home Medications    Current Outpatient Rx  Name  Route  Sig  Dispense  Refill  . morphine (AVINZA) 60 MG 24 hr capsule   Oral   Take 60 mg by mouth daily.         Marland Kitchen doxycycline (ADOXA) 100 MG tablet   Oral   Take 100 mg by mouth 2 (two) times daily.         Marland Kitchen esomeprazole (NEXIUM) 20 MG capsule   Oral   Take 20 mg by mouth daily before breakfast.         . furosemide (LASIX) 20 MG tablet   Oral   Take 1 tablet (20 mg total) by mouth daily.   30 tablet   11   . insulin aspart (NOVOLOG) 100 UNIT/ML injection   Subcutaneous   Inject 1-17 Units into the skin 4 (four) times daily. Per sliding scale         . insulin glargine (LANTUS) 100 UNIT/ML injection   Subcutaneous   Inject 10 Units into the skin at bedtime. Pt has been taking 25 units at night         . metoCLOPramide (REGLAN) 10 MG/10ML SOLN   Oral   Take 10 mLs (10 mg total) by mouth 3 (three) times daily as needed.   1200 mL   1   . Omeprazole-Sodium Bicarbonate (ZEGERID OTC) 20-1100 MG  CAPS   Oral   Take 1 capsule by mouth at bedtime.          . ondansetron (ZOFRAN) 8 MG tablet   Oral   Take 1 tablet (8 mg total) by mouth every 12 (twelve) hours as needed for nausea.   20 tablet   0   . oxyCODONE-acetaminophen (PERCOCET) 7.5-325 MG per tablet   Oral   Take 1 tablet by mouth every 4 (four) hours as needed for pain.   20 tablet   0   . potassium chloride (K-DUR) 10 MEQ tablet   Oral   Take 1 tablet (10 mEq total) by mouth daily.   7 tablet   0   . pregabalin (LYRICA) 100 MG capsule   Oral   Take 1 capsule (100 mg total) by mouth 3 (three) times daily.   90 capsule   0   . sildenafil (VIAGRA) 100 MG tablet   Oral   Take 1 tablet (100 mg total) by mouth daily as needed for erectile dysfunction.   10 tablet   11   . zolpidem (AMBIEN) 10 MG tablet   Oral   Take 1 tablet (10 mg total) by mouth at bedtime as needed for sleep.   15 tablet   0    BP 137/102  Pulse 102  Temp(Src) 98.2 F (36.8 C)  (Oral)  Resp 18  SpO2 98% Physical Exam  Nursing note and vitals reviewed. Constitutional: He is oriented to person, place, and time. He appears well-developed and well-nourished.  HENT:  Head: Normocephalic and atraumatic.  Mucous membranes somewhat dry.  Neck: Normal range of motion. Neck supple.  Cardiovascular: Normal rate, regular rhythm and normal heart sounds.   Pulmonary/Chest: Breath sounds normal. No respiratory distress. He has no wheezes.  Abdominal: Soft. Bowel sounds are normal. He exhibits no distension. There is tenderness.  There is tenderness to palpation across the upper abdomen. There is no rebound and no guarding.  Musculoskeletal: Normal range of motion. He exhibits no edema.  Neurological: He is alert and oriented to person, place, and time.  Skin: Skin is warm and dry.    ED Course  Procedures (including critical care time) Labs Review Labs Reviewed  COMPREHENSIVE METABOLIC PANEL  CBC WITH DIFFERENTIAL  LIPASE, BLOOD  URINALYSIS, ROUTINE W REFLEX MICROSCOPIC   Imaging Review No results found.    MDM  No diagnosis found. Patient is a 27 year old male with history of gastroparesis secondary to type 1 diabetes. He presents today with a recurrent episode of this it is causing him epigastric discomfort and vomiting. He is given pain medication and nausea medicine and fluids in the ER and is now feeling much better. Workup reveals an elevated white count of 33,000. A CT scan was obtained which revealed gastroparesis and possible aspiration pneumonia in the left lung, however no other acute intra-abdominal pathology.  It was my recommendation to this patient that he be admitted for antibiotics and close observation, however he does not want to do this. He is requesting to be discharged. He has agreed to a dose of antibiotics in the ER and to be discharged with antibiotics at home. He assures me he will return to the ER if his symptoms worsen or change. Is not  hypoxic and at this point is nontoxic appearing.    Geoffery Lyons, MD 09/22/13 [phone]

## 2013-09-22 NOTE — ED Notes (Signed)
Iv is positional, pt reminded to keep his arm straight in order for iv antibiotics to infuse. Pt and companion verbalize understanding.

## 2013-09-22 NOTE — ED Notes (Signed)
Pt states that he does not want to be admitted to the hospital, states "I will leave AMA if you try to admit me. I don't want to go over there.Marland KitchenMarland Kitchen"

## 2013-09-23 ENCOUNTER — Telehealth: Payer: Self-pay | Admitting: Family Medicine

## 2013-09-23 ENCOUNTER — Encounter (HOSPITAL_COMMUNITY): Payer: Self-pay | Admitting: Emergency Medicine

## 2013-09-23 ENCOUNTER — Inpatient Hospital Stay (HOSPITAL_COMMUNITY)
Admission: EM | Admit: 2013-09-23 | Discharge: 2013-10-05 | DRG: 207 | Disposition: A | Discharge status: Home Health | Payer: Medicaid Other | Attending: Pulmonary Disease | Admitting: Pulmonary Disease

## 2013-09-23 DIAGNOSIS — J690 Pneumonitis due to inhalation of food and vomit: Secondary | ICD-10-CM | POA: Diagnosis present

## 2013-09-23 DIAGNOSIS — R600 Localized edema: Secondary | ICD-10-CM

## 2013-09-23 DIAGNOSIS — B348 Other viral infections of unspecified site: Secondary | ICD-10-CM

## 2013-09-23 DIAGNOSIS — J069 Acute upper respiratory infection, unspecified: Secondary | ICD-10-CM | POA: Diagnosis present

## 2013-09-23 DIAGNOSIS — Z794 Long term (current) use of insulin: Secondary | ICD-10-CM

## 2013-09-23 DIAGNOSIS — D72829 Elevated white blood cell count, unspecified: Secondary | ICD-10-CM | POA: Diagnosis present

## 2013-09-23 DIAGNOSIS — E871 Hypo-osmolality and hyponatremia: Secondary | ICD-10-CM

## 2013-09-23 DIAGNOSIS — K219 Gastro-esophageal reflux disease without esophagitis: Secondary | ICD-10-CM | POA: Diagnosis present

## 2013-09-23 DIAGNOSIS — K3184 Gastroparesis: Secondary | ICD-10-CM

## 2013-09-23 DIAGNOSIS — I10 Essential (primary) hypertension: Secondary | ICD-10-CM | POA: Diagnosis not present

## 2013-09-23 DIAGNOSIS — N189 Chronic kidney disease, unspecified: Secondary | ICD-10-CM | POA: Diagnosis present

## 2013-09-23 DIAGNOSIS — Z720 Tobacco use: Secondary | ICD-10-CM

## 2013-09-23 DIAGNOSIS — Z91199 Patient's noncompliance with other medical treatment and regimen due to unspecified reason: Secondary | ICD-10-CM

## 2013-09-23 DIAGNOSIS — L97509 Non-pressure chronic ulcer of other part of unspecified foot with unspecified severity: Secondary | ICD-10-CM | POA: Diagnosis present

## 2013-09-23 DIAGNOSIS — A419 Sepsis, unspecified organism: Secondary | ICD-10-CM | POA: Diagnosis present

## 2013-09-23 DIAGNOSIS — E875 Hyperkalemia: Secondary | ICD-10-CM | POA: Diagnosis not present

## 2013-09-23 DIAGNOSIS — E1140 Type 2 diabetes mellitus with diabetic neuropathy, unspecified: Secondary | ICD-10-CM | POA: Diagnosis present

## 2013-09-23 DIAGNOSIS — IMO0002 Reserved for concepts with insufficient information to code with codable children: Secondary | ICD-10-CM

## 2013-09-23 DIAGNOSIS — B9789 Other viral agents as the cause of diseases classified elsewhere: Secondary | ICD-10-CM | POA: Diagnosis present

## 2013-09-23 DIAGNOSIS — Z79899 Other long term (current) drug therapy: Secondary | ICD-10-CM

## 2013-09-23 DIAGNOSIS — Z823 Family history of stroke: Secondary | ICD-10-CM

## 2013-09-23 DIAGNOSIS — R4182 Altered mental status, unspecified: Secondary | ICD-10-CM | POA: Diagnosis not present

## 2013-09-23 DIAGNOSIS — E43 Unspecified severe protein-calorie malnutrition: Secondary | ICD-10-CM | POA: Diagnosis present

## 2013-09-23 DIAGNOSIS — D649 Anemia, unspecified: Secondary | ICD-10-CM | POA: Diagnosis present

## 2013-09-23 DIAGNOSIS — E1065 Type 1 diabetes mellitus with hyperglycemia: Secondary | ICD-10-CM | POA: Diagnosis not present

## 2013-09-23 DIAGNOSIS — E1142 Type 2 diabetes mellitus with diabetic polyneuropathy: Secondary | ICD-10-CM | POA: Diagnosis present

## 2013-09-23 DIAGNOSIS — J45909 Unspecified asthma, uncomplicated: Secondary | ICD-10-CM | POA: Diagnosis present

## 2013-09-23 DIAGNOSIS — Z833 Family history of diabetes mellitus: Secondary | ICD-10-CM

## 2013-09-23 DIAGNOSIS — J80 Acute respiratory distress syndrome: Secondary | ICD-10-CM | POA: Diagnosis present

## 2013-09-23 DIAGNOSIS — R112 Nausea with vomiting, unspecified: Secondary | ICD-10-CM

## 2013-09-23 DIAGNOSIS — Z880 Allergy status to penicillin: Secondary | ICD-10-CM

## 2013-09-23 DIAGNOSIS — J9600 Acute respiratory failure, unspecified whether with hypoxia or hypercapnia: Secondary | ICD-10-CM | POA: Diagnosis not present

## 2013-09-23 DIAGNOSIS — E1049 Type 1 diabetes mellitus with other diabetic neurological complication: Secondary | ICD-10-CM | POA: Diagnosis not present

## 2013-09-23 DIAGNOSIS — Z9111 Patient's noncompliance with dietary regimen: Secondary | ICD-10-CM

## 2013-09-23 DIAGNOSIS — F17200 Nicotine dependence, unspecified, uncomplicated: Secondary | ICD-10-CM | POA: Diagnosis present

## 2013-09-23 DIAGNOSIS — R569 Unspecified convulsions: Secondary | ICD-10-CM | POA: Diagnosis present

## 2013-09-23 DIAGNOSIS — E1143 Type 2 diabetes mellitus with diabetic autonomic (poly)neuropathy: Secondary | ICD-10-CM

## 2013-09-23 DIAGNOSIS — N179 Acute kidney failure, unspecified: Secondary | ICD-10-CM

## 2013-09-23 DIAGNOSIS — R109 Unspecified abdominal pain: Secondary | ICD-10-CM | POA: Diagnosis present

## 2013-09-23 DIAGNOSIS — Z825 Family history of asthma and other chronic lower respiratory diseases: Secondary | ICD-10-CM

## 2013-09-23 DIAGNOSIS — E119 Type 2 diabetes mellitus without complications: Secondary | ICD-10-CM

## 2013-09-23 DIAGNOSIS — Z8614 Personal history of Methicillin resistant Staphylococcus aureus infection: Secondary | ICD-10-CM

## 2013-09-23 DIAGNOSIS — E10622 Type 1 diabetes mellitus with other skin ulcer: Secondary | ICD-10-CM

## 2013-09-23 DIAGNOSIS — Z8249 Family history of ischemic heart disease and other diseases of the circulatory system: Secondary | ICD-10-CM

## 2013-09-23 DIAGNOSIS — Z9119 Patient's noncompliance with other medical treatment and regimen: Secondary | ICD-10-CM

## 2013-09-23 DIAGNOSIS — E1040 Type 1 diabetes mellitus with diabetic neuropathy, unspecified: Secondary | ICD-10-CM

## 2013-09-23 DIAGNOSIS — G8929 Other chronic pain: Secondary | ICD-10-CM

## 2013-09-23 DIAGNOSIS — J9601 Acute respiratory failure with hypoxia: Secondary | ICD-10-CM

## 2013-09-23 LAB — BASIC METABOLIC PANEL
BUN: 12 mg/dL (ref 6–23)
CO2: 23 mEq/L (ref 19–32)
Calcium: 8.7 mg/dL (ref 8.4–10.5)
Chloride: 95 mEq/L — ABNORMAL LOW (ref 96–112)
Creatinine, Ser: 1.03 mg/dL (ref 0.50–1.35)
GFR calc Af Amer: >90 mL/min (ref >90)
GFR calc non Af Amer: >90 mL/min (ref >90)
Glucose, Bld: 564 mg/dL (ref 70–99)
Potassium: 4.9 mEq/L (ref 3.5–5.1)
Sodium: 126 mEq/L — ABNORMAL LOW (ref 135–145)

## 2013-09-23 LAB — URINALYSIS, ROUTINE W REFLEX MICROSCOPIC
Bilirubin Urine: NEGATIVE
Glucose, UA: >1000 mg/dL — AB
Ketones, ur: NEGATIVE mg/dL
Leukocytes, UA: NEGATIVE
Nitrite: NEGATIVE
Protein, ur: 100 mg/dL — AB
Specific Gravity, Urine: 1.021 (ref 1.005–1.030)
Urobilinogen, UA: 0.2 mg/dL (ref 0.0–1.0)
pH: 6 (ref 5.0–8.0)

## 2013-09-23 LAB — CBC WITH DIFFERENTIAL/PLATELET
Basophils Absolute: 0 10*3/uL (ref 0.0–0.1)
Basophils Relative: 0 % (ref 0–1)
Eosinophils Absolute: 0.3 10*3/uL (ref 0.0–0.7)
Eosinophils Relative: 1 % (ref 0–5)
HCT: 28.5 % — ABNORMAL LOW (ref 39.0–52.0)
Hemoglobin: 9.5 g/dL — ABNORMAL LOW (ref 13.0–17.0)
Lymphocytes Relative: 8 % — ABNORMAL LOW (ref 12–46)
Lymphs Abs: 2.4 10*3/uL (ref 0.7–4.0)
MCH: 26.1 pg (ref 26.0–34.0)
MCHC: 33.3 g/dL (ref 30.0–36.0)
MCV: 78.3 fL (ref 78.0–100.0)
Monocytes Absolute: 3.3 10*3/uL — ABNORMAL HIGH (ref 0.1–1.0)
Monocytes Relative: 11 % (ref 3–12)
Neutro Abs: 23.6 10*3/uL — ABNORMAL HIGH (ref 1.7–7.7)
Neutrophils Relative %: 80 % — ABNORMAL HIGH (ref 43–77)
Platelets: 625 10*3/uL — ABNORMAL HIGH (ref 150–400)
RBC: 3.64 MIL/uL — ABNORMAL LOW (ref 4.22–5.81)
RDW: 14.5 % (ref 11.5–15.5)
WBC: 29.6 10*3/uL — ABNORMAL HIGH (ref 4.0–10.5)

## 2013-09-23 LAB — COMPREHENSIVE METABOLIC PANEL
ALT: <5 U/L (ref 0–53)
AST: 9 U/L (ref 0–37)
Albumin: 2.2 g/dL — ABNORMAL LOW (ref 3.5–5.2)
Alkaline Phosphatase: 149 U/L — ABNORMAL HIGH (ref 39–117)
BUN: 12 mg/dL (ref 6–23)
CO2: 23 mEq/L (ref 19–32)
Calcium: 9 mg/dL (ref 8.4–10.5)
Chloride: 93 mEq/L — ABNORMAL LOW (ref 96–112)
Creatinine, Ser: 1.11 mg/dL (ref 0.50–1.35)
GFR calc Af Amer: >90 mL/min (ref >90)
GFR calc non Af Amer: 88 mL/min — ABNORMAL LOW (ref >90)
Glucose, Bld: 367 mg/dL — ABNORMAL HIGH (ref 70–99)
Potassium: 4.6 mEq/L (ref 3.5–5.1)
Sodium: 126 mEq/L — ABNORMAL LOW (ref 135–145)
Total Bilirubin: 0.4 mg/dL (ref 0.3–1.2)
Total Protein: 7.1 g/dL (ref 6.0–8.3)

## 2013-09-23 LAB — URINE MICROSCOPIC-ADD ON

## 2013-09-23 LAB — LACTIC ACID, PLASMA: Lactic Acid, Venous: 1.5 mmol/L (ref 0.5–2.2)

## 2013-09-23 MED ORDER — INSULIN ASPART 100 UNIT/ML ~~LOC~~ SOLN: 0.0000 [IU] | Freq: Every day | SUBCUTANEOUS | Status: DC

## 2013-09-23 MED ORDER — ONDANSETRON HCL 4 MG PO TABS: 4.0000 mg | ORAL_TABLET | Freq: Four times a day (QID) | ORAL | Status: DC | PRN

## 2013-09-23 MED ORDER — INSULIN GLARGINE 100 UNIT/ML ~~LOC~~ SOLN
30.0000 [IU] | Freq: Every day | SUBCUTANEOUS | Status: DC
  Administered 2013-09-23: 30 [IU] via SUBCUTANEOUS
  Filled 2013-09-23: qty 0.3

## 2013-09-23 MED ORDER — ACETAMINOPHEN 650 MG RE SUPP
650.0000 mg | Freq: Four times a day (QID) | RECTAL | Status: DC | PRN
  Administered 2013-09-25 – 2013-09-27 (×2): 650 mg via RECTAL
  Filled 2013-09-23 (×2): qty 1

## 2013-09-23 MED ORDER — PANTOPRAZOLE SODIUM 40 MG IV SOLR
40.0000 mg | Freq: Every day | INTRAVENOUS | Status: DC
  Administered 2013-09-23 – 2013-10-01 (×9): 40 mg via INTRAVENOUS
  Filled 2013-09-23 (×11): qty 40

## 2013-09-23 MED ORDER — VANCOMYCIN HCL IN DEXTROSE 750-5 MG/150ML-% IV SOLN
750.0000 mg | Freq: Once | INTRAVENOUS | Status: AC
  Administered 2013-09-23: 750 mg via INTRAVENOUS
  Filled 2013-09-23: qty 150

## 2013-09-23 MED ORDER — SODIUM CHLORIDE 0.9 % IV BOLUS (SEPSIS)
2000.0000 mL | Freq: Once | INTRAVENOUS | Status: AC
  Administered 2013-09-23: 2000 mL via INTRAVENOUS

## 2013-09-23 MED ORDER — INSULIN ASPART 100 UNIT/ML ~~LOC~~ SOLN
0.0000 [IU] | Freq: Three times a day (TID) | SUBCUTANEOUS | Status: DC
  Administered 2013-09-26: 3 [IU] via SUBCUTANEOUS

## 2013-09-23 MED ORDER — PROMETHAZINE HCL 25 MG/ML IJ SOLN
12.5000 mg | Freq: Once | INTRAMUSCULAR | Status: AC
  Administered 2013-09-23: 12.5 mg via INTRAVENOUS
  Filled 2013-09-23: qty 1

## 2013-09-23 MED ORDER — ONDANSETRON HCL 4 MG/2ML IJ SOLN
4.0000 mg | Freq: Four times a day (QID) | INTRAMUSCULAR | Status: DC | PRN
  Administered 2013-09-24 – 2013-10-02 (×3): 4 mg via INTRAVENOUS
  Filled 2013-09-23 (×3): qty 2

## 2013-09-23 MED ORDER — ENOXAPARIN SODIUM 30 MG/0.3ML ~~LOC~~ SOLN: 30.0000 mg | SUBCUTANEOUS | Status: DC

## 2013-09-23 MED ORDER — ENOXAPARIN SODIUM 40 MG/0.4ML ~~LOC~~ SOLN
40.0000 mg | SUBCUTANEOUS | Status: DC
  Administered 2013-09-23 – 2013-10-04 (×12): 40 mg via SUBCUTANEOUS
  Filled 2013-09-23 (×13): qty 0.4

## 2013-09-23 MED ORDER — HYDROMORPHONE HCL PF 1 MG/ML IJ SOLN
1.0000 mg | INTRAMUSCULAR | Status: DC | PRN
  Administered 2013-09-23 – 2013-09-25 (×9): 1 mg via INTRAVENOUS
  Filled 2013-09-23 (×10): qty 1

## 2013-09-23 MED ORDER — LEVOFLOXACIN IN D5W 750 MG/150ML IV SOLN
750.0000 mg | Freq: Once | INTRAVENOUS | Status: AC
  Administered 2013-09-23: 750 mg via INTRAVENOUS
  Filled 2013-09-23: qty 150

## 2013-09-23 MED ORDER — INSULIN ASPART 100 UNIT/ML ~~LOC~~ SOLN
10.0000 [IU] | Freq: Once | SUBCUTANEOUS | Status: AC
  Administered 2013-09-23: 10 [IU] via SUBCUTANEOUS

## 2013-09-23 MED ORDER — METOCLOPRAMIDE HCL 5 MG/ML IJ SOLN
5.0000 mg | Freq: Three times a day (TID) | INTRAMUSCULAR | Status: DC
  Administered 2013-09-24 – 2013-10-04 (×30): 5 mg via INTRAVENOUS
  Filled 2013-09-23: qty 2
  Filled 2013-09-23 (×6): qty 1
  Filled 2013-09-23 (×2): qty 2
  Filled 2013-09-23 (×6): qty 1
  Filled 2013-09-23: qty 2
  Filled 2013-09-23 (×22): qty 1

## 2013-09-23 MED ORDER — SODIUM CHLORIDE 0.9 % IV SOLN
INTRAVENOUS | Status: DC
  Administered 2013-09-23: via INTRAVENOUS
  Administered 2013-09-25: 1000 mL via INTRAVENOUS
  Administered 2013-09-25 – 2013-09-26 (×2): via INTRAVENOUS

## 2013-09-23 MED ORDER — CLINDAMYCIN PHOSPHATE 600 MG/50ML IV SOLN
600.0000 mg | Freq: Three times a day (TID) | INTRAVENOUS | Status: DC
  Administered 2013-09-23 – 2013-09-25 (×5): 600 mg via INTRAVENOUS
  Filled 2013-09-23 (×6): qty 50

## 2013-09-23 MED ORDER — ACETAMINOPHEN 325 MG PO TABS
650.0000 mg | ORAL_TABLET | Freq: Four times a day (QID) | ORAL | Status: DC | PRN
  Administered 2013-09-24 – 2013-09-29 (×4): 650 mg via ORAL
  Filled 2013-09-23 (×4): qty 2

## 2013-09-23 MED ORDER — VANCOMYCIN HCL IN DEXTROSE 750-5 MG/150ML-% IV SOLN
750.0000 mg | Freq: Three times a day (TID) | INTRAVENOUS | Status: DC
  Administered 2013-09-24 – 2013-09-25 (×4): 750 mg via INTRAVENOUS
  Filled 2013-09-23 (×5): qty 150

## 2013-09-23 MED ORDER — CLINDAMYCIN PHOSPHATE 600 MG/50ML IV SOLN
600.0000 mg | Freq: Once | INTRAVENOUS | Status: DC
  Filled 2013-09-23: qty 50

## 2013-09-23 MED ORDER — LEVOFLOXACIN IN D5W 750 MG/150ML IV SOLN
750.0000 mg | INTRAVENOUS | Status: DC
  Administered 2013-09-24: 750 mg via INTRAVENOUS
  Filled 2013-09-23 (×2): qty 150

## 2013-09-23 MED ORDER — MORPHINE SULFATE ER 30 MG PO TBCR
30.0000 mg | EXTENDED_RELEASE_TABLET | Freq: Two times a day (BID) | ORAL | Status: DC
  Administered 2013-09-23 – 2013-09-25 (×5): 30 mg via ORAL
  Filled 2013-09-23 (×5): qty 1

## 2013-09-23 MED ORDER — ZOLPIDEM TARTRATE 10 MG PO TABS
10.0000 mg | ORAL_TABLET | Freq: Every evening | ORAL | Status: DC | PRN
  Administered 2013-09-23: 10 mg via ORAL
  Filled 2013-09-23: qty 1

## 2013-09-23 MED ORDER — PREGABALIN 100 MG PO CAPS
100.0000 mg | ORAL_CAPSULE | Freq: Three times a day (TID) | ORAL | Status: DC
  Administered 2013-09-23 – 2013-10-05 (×33): 100 mg via ORAL
  Filled 2013-09-23 (×33): qty 1

## 2013-09-23 MED ORDER — SODIUM CHLORIDE 0.9 % IV BOLUS (SEPSIS)
500.0000 mL | Freq: Once | INTRAVENOUS | Status: AC
  Administered 2013-09-23: 500 mL via INTRAVENOUS

## 2013-09-23 NOTE — Progress Notes (Signed)
ANTIBIOTIC CONSULT NOTE - INITIAL  Pharmacy Consult for Vancomycin, Levofloxacin, Clindamycin Indication: rule out pneumonia (aspiration)  Allergies  Allergen Reactions  . Cefuroxime Axetil Anaphylaxis  . Penicillins Anaphylaxis  . Tessalon [Benzonatate] Anaphylaxis    Can take amoxicillin  . Shellfish Allergy Itching    Patient Measurements: Height: 5\' 8"  (172.7 cm) Weight: 130 lb (58.968 kg) IBW/kg (Calculated) : 68.4  Vital Signs: Temp: 98.4 F (36.9 C) (12/17 1849) Temp src: Oral (12/17 1849) BP: 129/88 mmHg (12/17 1849) Pulse Rate: 100 (12/17 1849)  Labs:  Recent Labs  09/22/13 0652 09/23/13 1522  WBC 32.2* 29.6*  HGB 11.5* 9.5*  PLT 622* 625*  CREATININE 0.90 1.11   Estimated Creatinine Clearance: 81.9 ml/min (by C-G formula based on Cr of 1.11).  Microbiology: No results found for this or any previous visit (from the past 720 hour(s)).  Medical History: Past Medical History  Diagnosis Date  . Diabetes mellitus   . GERD (gastroesophageal reflux disease)   . Asthma   . Hx MRSA infection     on face  . Gastroparesis   . Diabetic neuropathy   . Seizures     Medications:  Anti-infectives   Start     Dose/Rate Route Frequency Ordered Stop   09/23/13 1730  clindamycin (CLEOCIN) IVPB 600 mg  Status:  Discontinued     600 mg 100 mL/hr over 30 Minutes Intravenous  Once 09/23/13 1712 09/23/13 1850   09/23/13 1715  levofloxacin (LEVAQUIN) IVPB 750 mg     750 mg 100 mL/hr over 90 Minutes Intravenous  Once 09/23/13 1712       Assessment: 27 yo M admitted 12/17 with r/o aspiration pneumonia.  He was seen at [facility] on 12/16 and diagnosed with pneumonia; at home he had nausea and vomiting and did not get antibiotic Rx filled.  PMH includes severe diabetic gastroparesis and neuropathy, GERD.  Pharmacy is consulted to dose IV vancomycin, levofloxacin, and clindamycin.  Note multiple antibiotic allergies:  Cefuroxine (anaphylaxis), Penicillins (anaphylaxis).     Tmax: 102  WBCs: 29.6  Renal:  SCr 1.11, CrCl ~ 82 ml/min  CXR highly suspicious for aspiration pneumonia with bilateral, right greater than left lower lobe infiltrates.  Goal of Therapy:  Vancomycin trough level 15-20 mcg/ml Appropriate abx dosing, eradication of infection.   Plan:   Levaquin 750mg  IV q24h.  Clindamycin 600mg  IV q8h  Vancomycin 750mg  IV q8h.  Measure Vanc trough at steady state.  Follow up renal fxn and culture results.    Lynann Beaver PharmD, BCPS Pager [phone] 09/23/2013 7:19 PM

## 2013-09-23 NOTE — ED Notes (Signed)
Pt was seen at [facility] yesterday and diagnosed with pneumonia, had nausea and vomiting then, treated with fluids and antiemetics. Pt states not feeling better today, still with nausea and vomiting.  Pt did not get prescriptions filled for antibiotics

## 2013-09-23 NOTE — Progress Notes (Signed)
Utilization Review completed.  Steven Basso RN CM  

## 2013-09-23 NOTE — H&P (Signed)
[facility] History and Physical  JAIDON ELLERY [name]:[id number] DOB: 02-20-1986 DOA: 09/23/2013  Referring physician:EDP PCP: Nelwyn Salisbury, MD  Gi Dr.Pyrtle  Chief Complaint: nausea, vomiting and cough for 1 week  HPI: Jason [name] is a 27 y.o. male with past medical history significant for diabetes mellitus with Severe diabetic gastroparesis & neuropathy, GERD presents with above complaints. He states that he has had worsening abdominal pain along with nausea vomiting for the past week. In addition he also had cough and congestion and shortness of breath for 1 week. No diarrhea, no fevers or chills. He presented to ER yesterday, had CT abd pelvis  was found to have aspiration pneumonia and gastroparesis, but declined admission then and went home on Po Abx, due to persistent nature of symptoms and lack of improvement came back to the ER today. In ER, Vitals stable, leukocytosis of 29K with hyponatremia and anemia, [facility] was consulted     Review of Systems: positives bolded Constitutional:  No weight loss, night sweats, Fevers, chills, fatigue.  HEENT:  No headaches, Difficulty swallowing,Tooth/dental problems,Sore throat,  No sneezing, itching, ear ache, nasal congestion, post nasal drip,  Cardio-vascular:  No chest pain, Orthopnea, PND, swelling in lower extremities, anasarca, dizziness, palpitations  GI:  No heartburn, indigestion, abdominal pain, nausea, vomiting, diarrhea, change in bowel habits, loss of appetite  Resp:   shortness of breath with exertion or at rest. No excess mucus,  productive cough,  No coughing up of blood.No change in color of mucus.No wheezing.No chest wall deformity  Skin:  no rash or lesions.  GU:  no dysuria, change in color of urine, no urgency or frequency. No flank pain.  Musculoskeletal:  No joint pain or swelling. No decreased range of motion. No back pain.  Psych:  No change in mood or affect. No depression or anxiety. No memory  loss.   Past Medical History  Diagnosis Date  . Diabetes mellitus   . GERD (gastroesophageal reflux disease)   . Asthma   . Hx MRSA infection     on face  . Gastroparesis   . Diabetic neuropathy   . Seizures    Past Surgical History  Procedure Laterality Date  . Tonsillectomy     Social History:  reports that he has been smoking Cigars.  He has never used smokeless tobacco. He reports that he does not drink alcohol or use illicit drugs.  Allergies  Allergen Reactions  . Cefuroxime Axetil Anaphylaxis  . Penicillins Anaphylaxis  . Tessalon [Benzonatate] Anaphylaxis    Can take amoxicillin  . Shellfish Allergy Itching    Family History  Problem Relation Age of Onset  . Diabetes Father   . Hypertension Father   . Asthma      fhx  . Hypertension      fhx  . Stroke      fhx  . Heart disease Mother      Prior to Admission medications   Medication Sig Start Date End Date Taking? Authorizing Provider  dexlansoprazole (DEXILANT) 60 MG capsule Take 60 mg by mouth daily.   Yes Historical Provider, MD  insulin aspart (NOVOLOG) 100 UNIT/ML injection Inject 1-17 Units into the skin 4 (four) times daily. Per sliding scale   Yes Historical Provider, MD  insulin glargine (LANTUS) 100 UNIT/ML injection Inject 30 Units into the skin at bedtime.  07/02/13  Yes Costin Otelia Sergeant, MD  metoCLOPramide (REGLAN) 10 MG/10ML SOLN Take 10 mg by mouth 3 (three) times daily as  needed for nausea or vomiting. 07/02/13  Yes Costin Otelia Sergeant, MD  morphine (AVINZA) 60 MG 24 hr capsule Take 60 mg by mouth daily.   Yes Historical Provider, MD  Omeprazole-Sodium Bicarbonate (ZEGERID OTC) 20-1100 MG CAPS Take 1 capsule by mouth at bedtime.    Yes Historical Provider, MD  ondansetron (ZOFRAN) 8 MG tablet Take 1 tablet (8 mg total) by mouth every 12 (twelve) hours as needed for nausea. 07/02/13  Yes Costin Otelia Sergeant, MD  oxyCODONE-acetaminophen (PERCOCET) 7.5-325 MG per tablet Take 1 tablet by mouth every 4  (four) hours as needed for pain. 09/13/13  Yes Hope Orlene Och, NP  potassium chloride (K-DUR) 10 MEQ tablet Take 10 mEq by mouth daily. 07/02/13  Yes Costin Otelia Sergeant, MD  pregabalin (LYRICA) 100 MG capsule Take 100 mg by mouth 3 (three) times daily. 07/02/13  Yes Costin Otelia Sergeant, MD  sildenafil (VIAGRA) 100 MG tablet Take 1 tablet (100 mg total) by mouth daily as needed for erectile dysfunction. 08/11/13  Yes Nelwyn Salisbury, MD  zolpidem (AMBIEN) 10 MG tablet Take 10 mg by mouth at bedtime as needed for sleep. 07/02/13  Yes Costin Otelia Sergeant, MD  levofloxacin (LEVAQUIN) 750 MG tablet Take 750 mg by mouth daily. X 7 days 09/22/13   Geoffery Lyons, MD   Physical Exam: Filed Vitals:   09/23/13 1736  BP: 123/83  Pulse: 100  Temp:   Resp: 11    BP 123/83  Pulse 100  Temp(Src) 99.6 F (37.6 C) (Oral)  Resp 11  SpO2 97%  General:  Ill appearing, but Appears calm and no distress comfortable Eyes: PERRL,hyperpigmentation around eyelids, irises & conjunctiva ENT: grossly normal hearing, lips & tongue Neck: no LAD, masses or thyromegaly Cardiovascular: RRR, no m/r/g. No LE edema. Respiratory: bilateral ronchi at both bases no w/r/r. Normal respiratory effort. Abdomen: soft, nt, nd, BS present Skin: no rash or induration seen on limited exam Musculoskeletal: grossly normal tone BUE/BLE Psychiatric: grossly normal mood and affect, speech fluent and appropriate Neurologic: grossly non-focal.          Labs on Admission:  Basic Metabolic Panel:  Recent Labs Lab 09/22/13 0652 09/23/13 1522  NA 137 126*  K 3.8 4.6  CL 100 93*  CO2 27 23  GLUCOSE 49* 367*  BUN 9 12  CREATININE 0.90 1.11  CALCIUM 9.2 9.0   Liver Function Tests:  Recent Labs Lab 09/22/13 0652 09/23/13 1522  AST 12 9  ALT 5 <5  ALKPHOS 143* 149*  BILITOT 0.4 0.4  PROT 8.1 7.1  ALBUMIN 2.6* 2.2*    Recent Labs Lab 09/22/13 0652  LIPASE 11   No results found for this basename: AMMONIA,  in the last 168  hours CBC:  Recent Labs Lab 09/22/13 0652 09/23/13 1522  WBC 32.2* 29.6*  NEUTROABS 27.0* 23.6*  HGB 11.5* 9.5*  HCT 34.9* 28.5*  MCV 79.9 78.3  PLT 622* 625*   Cardiac Enzymes: No results found for this basename: CKTOTAL, CKMB, CKMBINDEX, TROPONINI,  in the last 168 hours  BNP (last 3 results) No results found for this basename: PROBNP,  in the last 8760 hours CBG:  Recent Labs Lab 09/22/13 0845 09/23/13 1334  GLUCAP 393* 383*    Radiological Exams on Admission: Ct Abdomen Pelvis W Contrast  09/22/2013   CLINICAL DATA:  Vomiting, gastroparesis  EXAM: CT ABDOMEN AND PELVIS WITH CONTRAST  TECHNIQUE: Multidetector CT imaging of the abdomen and pelvis was performed using the standard protocol  following bolus administration of intravenous contrast.  CONTRAST:  OMNIPAQUE IOHEXOL 300 MG/ML  SOLN  COMPARISON:  04/11/2013  FINDINGS: Lung bases shows patchy infiltrate bilateral lower lobe right greater than loop left. Findings are highly suspicious for aspiration pneumonia. Clinical correlation is necessary. Moderate size hiatal hernia measures 3.8 x 3.2 cm. There is significant gastric distension with contrast and debris highly suspicious for gastroparesis. No evidence of gastric outlet obstruction. Nonspecific mild distended small bowel loops with fluid without evidence of transition point in caliber. No evidence of small bowel obstruction.  Some stool noted in right colon and transverse colon. Normal appendix clearly visualize in axial image 66. Markedly distended urinary bladder. No destructive bony lesions are noted. No distal colonic obstruction.  Prostate gland and seminal vesicles are unremarkable. Multiple pelvic phleboliths.  Kidneys are symmetrical in size and enhancement. No hydronephrosis or hydroureter. There liver spleen, pancreas and adrenals are unremarkable. Gallbladder is contracted without evidence of calcified gallstones.  IMPRESSION: 1. There is patchy infiltrate  bilateral lower lobe right greater than left highly suspicious for aspiration pneumonia. Clinical correlation is necessary. 2. Moderate size hiatal hernia. 3. Significant gastric distension highly suspicious for gastroparesis. No gastric outlet obstruction. 4. Nonspecific mild distended small bowel loops without evidence of small bowel obstruction or transition point in caliber. 5. Normal appendix. 6. Significant distension urinary bladder. No evidence of calcified calculi.   Electronically Signed   By: Natasha Mead M.D.   On: 09/22/2013 09:18     Assessment/Plan   1. Aspiration pneumonia -suspect from recurrent vomiting and gastroparesis -start IV Zosyn -Fu sputum Cx -check lactic acid  2. Severe gastroparesis -last EGD 7/14 c/w this diagnosis -start with clears, and advance diet as tolerated -IV reglan pre meal -IV PPI -Fu with GI  3. Anemia -drop in Hb from yesterday, likely hemodilution secondary to IVF received in ER yesterday -check hemoccult -IV PPI    4. DM -uncontrolled and brittle -continue lantus, SSI -check hbaic    5. Abdominal pain -acute on chronic, due to 1,2 -continue long acting pain meds per home regimen, Avinza not on formulary and hence substituted -long term challenge, complicates management of gastroparesis -check Lactic acid -Ct abd benign  6. Hyponatremia -due to volume depletion -hydrate and monitor  DVT proph: lovenox   Code Status: Full COde Family Communication:d/w pt and mother at bedside Disposition Plan: inpatient  Time spent:78min  [facility] Pager [phone]

## 2013-09-23 NOTE — Telephone Encounter (Signed)
FYI

## 2013-09-23 NOTE — ED Provider Notes (Signed)
CSN: [id number]     Arrival date & time 09/23/13  1332 History   First MD Initiated Contact with Patient 09/23/13 1506     Chief Complaint  Patient presents with  . Abdominal Pain  . Emesis   (Consider location/radiation/quality/duration/timing/severity/associated sxs/prior Treatment) Patient is a 27 y.o. male presenting with abdominal pain and vomiting. The history is provided by the patient.  Abdominal Pain Pain quality: sharp   Pain radiates to:  Epigastric region Pain severity:  Moderate Onset quality:  Gradual Duration:  2 days Timing:  Constant Progression:  Worsening Chronicity:  Recurrent Context: not alcohol use, not diet changes, not eating, not medication withdrawal, not recent illness, not sick contacts and not suspicious food intake   Relieved by:  Nothing Worsened by:  Palpation Ineffective treatments:  None tried Associated symptoms: cough, nausea and vomiting   Associated symptoms: no anorexia, no chest pain, no chills, no constipation, no diarrhea, no dysuria, no fatigue, no fever, no hematemesis, no hematochezia, no hematuria, no melena, no shortness of breath and no sore throat   Cough:    Cough characteristics:  Non-productive   Sputum characteristics:  Nondescript   Severity:  Moderate   Onset quality:  Gradual   Duration:  2 days   Timing:  Constant Emesis Associated symptoms: abdominal pain   Associated symptoms: no chills, no diarrhea, no myalgias and no sore throat     Past Medical History  Diagnosis Date  . Diabetes mellitus   . GERD (gastroesophageal reflux disease)   . Asthma   . Hx MRSA infection     on face  . Gastroparesis   . Diabetic neuropathy   . Seizures    Past Surgical History  Procedure Laterality Date  . Tonsillectomy     Family History  Problem Relation Age of Onset  . Diabetes Father   . Hypertension Father   . Asthma      fhx  . Hypertension      fhx  . Stroke      fhx  . Heart disease Mother    History    Substance Use Topics  . Smoking status: Current Some Day Smoker    Types: Cigars  . Smokeless tobacco: Never Used     Comment: e-cigarette  . Alcohol Use: No     Comment: Rarely    Review of Systems  Constitutional: Negative for fever, chills and fatigue.  HENT: Negative for congestion, rhinorrhea and sore throat.   Respiratory: Positive for cough. Negative for shortness of breath.   Cardiovascular: Negative for chest pain, palpitations and leg swelling.  Gastrointestinal: Positive for nausea, vomiting and abdominal pain. Negative for diarrhea, constipation, melena, hematochezia, anorexia and hematemesis.  Endocrine: Negative for polydipsia, polyphagia and polyuria.  Genitourinary: Negative for dysuria and hematuria.  Musculoskeletal: Negative for myalgias.  Skin: Negative for rash.  Neurological: Negative for dizziness.    Allergies  Cefuroxime axetil; Penicillins; Tessalon; and Shellfish allergy  Home Medications   Current Outpatient Rx  Name  Route  Sig  Dispense  Refill  . dexlansoprazole (DEXILANT) 60 MG capsule   Oral   Take 60 mg by mouth daily.         . insulin aspart (NOVOLOG) 100 UNIT/ML injection   Subcutaneous   Inject 1-17 Units into the skin 4 (four) times daily. Per sliding scale         . insulin glargine (LANTUS) 100 UNIT/ML injection   Subcutaneous   Inject 30 Units into the  skin at bedtime.          . metoCLOPramide (REGLAN) 10 MG/10ML SOLN   Oral   Take 10 mg by mouth 3 (three) times daily as needed for nausea or vomiting.         Marland Kitchen morphine (AVINZA) 60 MG 24 hr capsule   Oral   Take 60 mg by mouth daily.         [name] Bicarbonate (ZEGERID OTC) 20-1100 MG CAPS   Oral   Take 1 capsule by mouth at bedtime.          . ondansetron (ZOFRAN) 8 MG tablet   Oral   Take 1 tablet (8 mg total) by mouth every 12 (twelve) hours as needed for nausea.   20 tablet   0   . oxyCODONE-acetaminophen (PERCOCET) 7.5-325 MG per  tablet   Oral   Take 1 tablet by mouth every 4 (four) hours as needed for pain.   20 tablet   0   . potassium chloride (K-DUR) 10 MEQ tablet   Oral   Take 10 mEq by mouth daily.         . pregabalin (LYRICA) 100 MG capsule   Oral   Take 100 mg by mouth 3 (three) times daily.         . sildenafil (VIAGRA) 100 MG tablet   Oral   Take 1 tablet (100 mg total) by mouth daily as needed for erectile dysfunction.   10 tablet   11   . zolpidem (AMBIEN) 10 MG tablet   Oral   Take 10 mg by mouth at bedtime as needed for sleep.         Marland Kitchen levofloxacin (LEVAQUIN) 750 MG tablet   Oral   Take 750 mg by mouth daily. X 7 days          BP 123/83  Pulse 100  Temp(Src) 99.6 F (37.6 C) (Oral)  Resp 11  SpO2 97% Physical Exam  Nursing note and vitals reviewed. Constitutional: He is oriented to person, place, and time. He appears well-developed and well-nourished. No distress.  HENT:  Head: Normocephalic and atraumatic.  Mouth/Throat: Oropharynx is clear and moist.  Eyes: Pupils are equal, round, and reactive to light.  Neck: Normal range of motion. Neck supple.  Cardiovascular: Regular rhythm and normal heart sounds.  Exam reveals no gallop.   No murmur heard. Pulmonary/Chest: Effort normal and breath sounds normal. No respiratory distress.  Abdominal: Soft. Normal appearance and bowel sounds are normal. He exhibits no distension. There is tenderness. There is no rigidity, no rebound and no guarding.    Neurological: He is alert and oriented to person, place, and time. He exhibits normal muscle tone. Coordination normal.  Skin: Skin is warm and dry. No rash noted.    ED Course  Procedures (including critical care time) Labs Review Labs Reviewed  GLUCOSE, CAPILLARY - Abnormal; Notable for the following:    Glucose-Capillary 383 (*)    All other components within normal limits  CBC WITH DIFFERENTIAL - Abnormal; Notable for the following:    WBC 29.6 (*)    RBC 3.64 (*)      Hemoglobin 9.5 (*)    HCT 28.5 (*)    Platelets 625 (*)    Neutrophils Relative % 80 (*)    Lymphocytes Relative 8 (*)    Neutro Abs 23.6 (*)    Monocytes Absolute 3.3 (*)    All other components within normal limits  COMPREHENSIVE  METABOLIC PANEL - Abnormal; Notable for the following:    Sodium 126 (*)    Chloride 93 (*)    Glucose, Bld 367 (*)    Albumin 2.2 (*)    Alkaline Phosphatase 149 (*)    GFR calc non Af Amer 88 (*)    All other components within normal limits  URINALYSIS, ROUTINE W REFLEX MICROSCOPIC  LACTIC ACID, PLASMA   Imaging Review Ct Abdomen Pelvis W Contrast  09/22/2013   CLINICAL DATA:  Vomiting, gastroparesis  EXAM: CT ABDOMEN AND PELVIS WITH CONTRAST  TECHNIQUE: Multidetector CT imaging of the abdomen and pelvis was performed using the standard protocol following bolus administration of intravenous contrast.  CONTRAST:  OMNIPAQUE IOHEXOL 300 MG/ML  SOLN  COMPARISON:  04/11/2013  FINDINGS: Lung bases shows patchy infiltrate bilateral lower lobe right greater than loop left. Findings are highly suspicious for aspiration pneumonia. Clinical correlation is necessary. Moderate size hiatal hernia measures 3.8 x 3.2 cm. There is significant gastric distension with contrast and debris highly suspicious for gastroparesis. No evidence of gastric outlet obstruction. Nonspecific mild distended small bowel loops with fluid without evidence of transition point in caliber. No evidence of small bowel obstruction.  Some stool noted in right colon and transverse colon. Normal appendix clearly visualize in axial image 66. Markedly distended urinary bladder. No destructive bony lesions are noted. No distal colonic obstruction.  Prostate gland and seminal vesicles are unremarkable. Multiple pelvic phleboliths.  Kidneys are symmetrical in size and enhancement. No hydronephrosis or hydroureter. There liver spleen, pancreas and adrenals are unremarkable. Gallbladder is contracted without  evidence of calcified gallstones.  IMPRESSION: 1. There is patchy infiltrate bilateral lower lobe right greater than left highly suspicious for aspiration pneumonia. Clinical correlation is necessary. 2. Moderate size hiatal hernia. 3. Significant gastric distension highly suspicious for gastroparesis. No gastric outlet obstruction. 4. Nonspecific mild distended small bowel loops without evidence of small bowel obstruction or transition point in caliber. 5. Normal appendix. 6. Significant distension urinary bladder. No evidence of calcified calculi.   Electronically Signed   By: Natasha Mead M.D.   On: 09/22/2013 09:18   The patient will be admitted to the hospital.  Patient has worsening in his electrolytes.  Patient is advised of the need for admission.  Voices an understanding  Carlyle Dolly, [place] 09/23/13 1803

## 2013-09-23 NOTE — Progress Notes (Signed)
[facility] provided pt with a [employer], a list of primary care resources, and [facility] information.

## 2013-09-23 NOTE — ED Notes (Addendum)
Pt c/o generalized abdominal pain and n/v x "around a week."  Pain score 8/10.  Denies diarrhea.  Hx of PNA, gastroparesis, and DM Type I.  Pt was seen at [facility] for same yesterday.  Pt reports not having insulin since yesterday.

## 2013-09-23 NOTE — Telephone Encounter (Signed)
Patient Information:  Caller Name: [name]  Phone: [phone]  Patient: Jason [name], Jason [name]  Gender: Male  DOB: 1985-12-22  Age: 27 Years  PCP: [facility]  Office Follow Up:  Does the office need to follow up with this patient?: Yes  Instructions For The Office: Please call ASAP regarding ED disposition  RN Note:  Caller not with son at time of call. FBS 315 at 0730.  Was 15 09/22/13 when medics were called.  Diagnosed with pneumonia at ED. Wheezing present; using Albuterol. Reports shortness of breath while resting. Resp rate 32 rpm. Mild vomiting daily without diarrhea for past weeek (09/16/13). Family reported significant weight loss.   Kathryn/office staff ordered send message now for office to follow up with family.  Symptoms  Reason For Call & Symptoms: Emergent Call:  Father requesting admission for pneumonia  (diagnosed at [facility] 09/22/13) with high and low blood sugars.  Called 911 with blood sugar of 15 09/22/13. Has been loosing "too much weight" over past 2 months.  Was started on unknown antibiotic; was already on two antibiotics, Clindomycin, Doxicycline.  Reviewed Health History In EMR: Yes  Reviewed Medications In EMR: Yes  Reviewed Allergies In EMR: Yes  Reviewed Surgeries / Procedures: Yes  Date of Onset of Symptoms: 09/16/2013  Treatments Tried: two antibiotics  Treatments Tried Worked: No  Any Fever: Yes  Fever Taken: Oral  Fever Time Of Reading: 11:30:00  Fever Last Reading: 102  Guideline(s) Used:  Breathing Difficulty  Asthma Attack  Disposition Per Guideline:   Go to ED Now  Reason For Disposition Reached:   Severe asthma attack (e.g., very SOB at rest, speaks in single words, loud wheezes)  Advice Given:  N/A  RN Overrode Recommendation:  Follow Up With Office Later  Office to discuss with MD and call back regarding disposition

## 2013-09-23 NOTE — Telephone Encounter (Signed)
Spoke to pt's father [name], told him need to take pt to ED to be evaluated,with blood sugars so erractic and pt not feeling any better, wheezing, SOB and vomiting. Don verbalized understanding and is going to take pt to [facility].

## 2013-09-24 DIAGNOSIS — E43 Unspecified severe protein-calorie malnutrition: Secondary | ICD-10-CM | POA: Diagnosis present

## 2013-09-24 LAB — BASIC METABOLIC PANEL
BUN: 10 mg/dL (ref 6–23)
Calcium: 8.7 mg/dL (ref 8.4–10.5)
GFR calc Af Amer: >90 mL/min (ref >90)
GFR calc non Af Amer: >90 mL/min (ref >90)
Potassium: 4.3 mEq/L (ref 3.5–5.1)
Sodium: 128 mEq/L — ABNORMAL LOW (ref 135–145)

## 2013-09-24 LAB — EXPECTORATED SPUTUM ASSESSMENT W GRAM STAIN, RFLX TO RESP C: Special Requests: NORMAL

## 2013-09-24 LAB — CBC
MCHC: 33 g/dL (ref 30.0–36.0)
RDW: 14.5 % (ref 11.5–15.5)

## 2013-09-24 LAB — GLUCOSE, CAPILLARY
Glucose-Capillary: 65 mg/dL — ABNORMAL LOW (ref 70–99)
Glucose-Capillary: 42 mg/dL — CL (ref 70–99)
Glucose-Capillary: 72 mg/dL (ref 70–99)
Glucose-Capillary: 139 mg/dL — ABNORMAL HIGH (ref 70–99)
Glucose-Capillary: 123 mg/dL — ABNORMAL HIGH (ref 70–99)

## 2013-09-24 LAB — HEMOGLOBIN A1C
Hgb A1c MFr Bld: 13.3 % — ABNORMAL HIGH (ref <5.7)
Mean Plasma Glucose: 335 mg/dL — ABNORMAL HIGH (ref <117)

## 2013-09-24 LAB — MRSA PCR SCREENING: MRSA by PCR: NEGATIVE

## 2013-09-24 MED ORDER — DEXTROSE 50 % IV SOLN
INTRAVENOUS | Status: AC
  Administered 2013-09-24: 25 mL
  Filled 2013-09-24: qty 50

## 2013-09-24 MED ORDER — HYDROCOD POLST-CHLORPHEN POLST 10-8 MG/5ML PO LQCR
5.0000 mL | Freq: Two times a day (BID) | ORAL | Status: DC | PRN
  Administered 2013-09-25: 5 mL via ORAL
  Filled 2013-09-24 (×2): qty 5

## 2013-09-24 MED ORDER — GLUCERNA SHAKE PO LIQD
237.0000 mL | Freq: Two times a day (BID) | ORAL | Status: DC
  Administered 2013-09-24 – 2013-09-27 (×4): 237 mL via ORAL
  Filled 2013-09-24 (×17): qty 237

## 2013-09-24 MED ORDER — INSULIN GLARGINE 100 UNIT/ML ~~LOC~~ SOLN
10.0000 [IU] | Freq: Every day | SUBCUTANEOUS | Status: DC
  Administered 2013-09-24 – 2013-09-25 (×2): 10 [IU] via SUBCUTANEOUS
  Filled 2013-09-24 (×3): qty 0.1

## 2013-09-24 MED ORDER — SILVER SULFADIAZINE 1 % EX CREA
TOPICAL_CREAM | Freq: Every day | CUTANEOUS | Status: DC
  Administered 2013-09-24 – 2013-09-25 (×2): via TOPICAL
  Administered 2013-09-26: 1 via TOPICAL
  Administered 2013-09-28 – 2013-09-29 (×2): via TOPICAL
  Administered 2013-09-30: 1 via TOPICAL
  Administered 2013-09-30 – 2013-10-04 (×5): via TOPICAL
  Filled 2013-09-24: qty 50
  Filled 2013-09-24: qty 85

## 2013-09-24 MED ORDER — DEXTROSE 5 % AND 0.45 % NACL IV BOLUS
500.0000 mL | Freq: Once | INTRAVENOUS | Status: AC
  Administered 2013-09-24: 500 mL via INTRAVENOUS

## 2013-09-24 NOTE — Progress Notes (Signed)
PROGRESS NOTE  Jason [name] ZOX:[id number] DOB: 07-05-1986 DOA: 09/23/2013 PCP: Nelwyn Salisbury, MD  Assessment/Plan: Aspiration pneumonia  - suspect from recurrent vomiting and gastroparesis, and severe coughing.  - Vanc/Levo/Clinda, narrow to Levofloxacin tomorrow.  - Fu sputum Cx  Severe gastroparesis  - last EGD 7/14 c/w this diagnosis  - start with clears, and advance diet as tolerated  - IV reglan pre meal  - IV PPI  - Fu with GI as an outpatient Anemia  - drop in Hb from yesterday, likely hemodilution secondary to IVF received in ER yesterday  - check hemoccult  - IV PPI  DM  -uncontrolled and brittle mainly because patient is using Lantus as meal coverage and not truly as basal.  -continue lantus, SSI  -check hbaic  Abdominal pain  -acute on chronic, due to 1,2  -continue long acting pain meds per home regimen, Avinza not on formulary and hence substituted  -long term challenge, complicates management of gastroparesis  Hyponatremia  -due to volume depletion  -hydrate and monitor   Diet: advance as tolerated Fluids: NS DVT Prophylaxis: Lovenox  Code Status: Full Family Communication: none  Disposition Plan: inpatient, home when ready  Consultants:  none  Procedures:  none   Antibiotics  Anti-infectives   Start     Dose/Rate Route Frequency Ordered Stop   09/24/13 1600  levofloxacin (LEVAQUIN) IVPB 750 mg     750 mg 100 mL/hr over 90 Minutes Intravenous Every 24 hours 09/23/13 1935     09/24/13 0200  vancomycin (VANCOCIN) IVPB 750 mg/150 ml premix     750 mg 150 mL/hr over 60 Minutes Intravenous Every 8 hours 09/23/13 1907     09/23/13 2000  clindamycin (CLEOCIN) IVPB 600 mg     600 mg 100 mL/hr over 30 Minutes Intravenous 3 times per day 09/23/13 1935     09/23/13 1915  vancomycin (VANCOCIN) IVPB 750 mg/150 ml premix     750 mg 150 mL/hr over 60 Minutes Intravenous  Once 09/23/13 1907 09/23/13 2054   09/23/13 1730  clindamycin (CLEOCIN)  IVPB 600 mg  Status:  Discontinued     600 mg 100 mL/hr over 30 Minutes Intravenous  Once 09/23/13 1712 09/23/13 1850   09/23/13 1715  levofloxacin (LEVAQUIN) IVPB 750 mg     750 mg 100 mL/hr over 90 Minutes Intravenous  Once 09/23/13 1712 09/23/13 1905     Antibiotics Given (last 72 hours)   Date/Time Action Medication Dose Rate   09/23/13 1954 Given   vancomycin (VANCOCIN) IVPB 750 mg/150 ml premix 750 mg 150 mL/hr   09/23/13 2100 Given   clindamycin (CLEOCIN) IVPB 600 mg 600 mg 100 mL/hr   09/24/13 0236 Given   vancomycin (VANCOCIN) IVPB 750 mg/150 ml premix 750 mg 150 mL/hr   09/24/13 0558 Given   clindamycin (CLEOCIN) IVPB 600 mg 600 mg 100 mL/hr   09/24/13 1244 Given   vancomycin (VANCOCIN) IVPB 750 mg/150 ml premix 750 mg 150 mL/hr      HPI/Subjective: - feeling well, denies nausea/vomiting. Coughing.   Objective: Filed Vitals:   09/23/13 1814 09/23/13 1849 09/23/13 2100 09/24/13 0616  BP:  129/88 126/80 111/74  Pulse:  100 100 97  Temp: 102 F (38.9 C) 98.4 F (36.9 C) 99 F (37.2 C) 99.6 F (37.6 C)  TempSrc: Rectal Oral Oral Oral  Resp:  16 18 16   Height:  5\' 8"  (1.727 m)    Weight:  58.968 kg (130 lb)  SpO2:  10% 100% 99%    Intake/Output Summary (Last 24 hours) at 09/24/13 1323 Last data filed at 09/24/13 1100  Gross per 24 hour  Intake 1798.33 ml  Output   1850 ml  Net -51.67 ml   Filed Weights   09/23/13 1849  Weight: 58.968 kg (130 lb)    Exam:   General:  NAD  Cardiovascular: regular rate and rhythm, without MRG  Respiratory: bilateral rhonchi.   Abdomen: soft, not tender to palpation, positive bowel sounds  MSK: no peripheral edema  Neuro: CN 2-12 grossly intact, MS 5/5 in all 4  Data Reviewed: Basic Metabolic Panel:  Recent Labs Lab 09/22/13 0652 09/23/13 1522 09/23/13 2143 09/24/13 0349  NA 137 126* 126* 128*  K 3.8 4.6 4.9 4.3  CL 100 93* 95* 98  CO2 27 23 23 23   GLUCOSE 49* 367* 564* 209*  BUN 9 12 12 10     CREATININE 0.90 1.11 1.03 1.00  CALCIUM 9.2 9.0 8.7 8.7   Liver Function Tests:  Recent Labs Lab 09/22/13 0652 09/23/13 1522  AST 12 9  ALT 5 <5  ALKPHOS 143* 149*  BILITOT 0.4 0.4  PROT 8.1 7.1  ALBUMIN 2.6* 2.2*    Recent Labs Lab 09/22/13 0652  LIPASE 11   CBC:  Recent Labs Lab 09/22/13 0652 09/23/13 1522 09/24/13 0349  WBC 32.2* 29.6* 27.2*  NEUTROABS 27.0* 23.6*  --   HGB 11.5* 9.5* 8.6*  HCT 34.9* 28.5* 26.1*  MCV 79.9 78.3 78.4  PLT 622* 625* 591*   CBG:  Recent Labs Lab 09/24/13 0759 09/24/13 0828 09/24/13 0849 09/24/13 0917 09/24/13 1205  GLUCAP 24* 65* 42* 72 106*    Recent Results (from the past 240 hour(s))  CULTURE, EXPECTORATED SPUTUM-ASSESSMENT     Status: None   Collection Time    09/24/13  6:02 AM      Result Value Range Status   Specimen Description SPUTUM   Final   Special Requests Normal   Final   Sputum evaluation     Final   Value: THIS SPECIMEN IS ACCEPTABLE. RESPIRATORY CULTURE REPORT TO FOLLOW.   Report Status 09/24/2013 FINAL   Final  MRSA PCR SCREENING     Status: None   Collection Time    09/24/13  7:09 AM      Result Value Range Status   MRSA by PCR NEGATIVE  NEGATIVE Final   Comment:            The GeneXpert MRSA Assay (FDA     approved for NASAL specimens     only), is one component of a     comprehensive MRSA colonization     surveillance program. It is not     intended to diagnose MRSA     infection nor to guide or     monitor treatment for     MRSA infections.     Studies: No results found.  Scheduled Meds: . clindamycin (CLEOCIN) IV  600 mg Intravenous Q8H  . dextrose 5 % and 0.45% NaCl  500 mL Intravenous Once  . enoxaparin (LOVENOX) injection  40 mg Subcutaneous Q24H  . feeding supplement (GLUCERNA SHAKE)  237 mL Oral BID BM & HS  . insulin aspart  0-15 Units Subcutaneous TID WC  . insulin aspart  0-5 Units Subcutaneous QHS  . insulin glargine  10 Units Subcutaneous QHS  . levofloxacin  (LEVAQUIN) IV  750 mg Intravenous Q24H  . metoCLOPramide (REGLAN) injection  5 mg  Intravenous TID AC  . morphine  30 mg Oral Q12H  . pantoprazole (PROTONIX) IV  40 mg Intravenous QHS  . pregabalin  100 mg Oral TID  . vancomycin  750 mg Intravenous Q8H   Continuous Infusions: . sodium chloride 125 mL/hr at 09/23/13 2353    Active Problems:   Type 1 diabetes, uncontrolled, with neuropathy   Nausea and vomiting   Noncompliance with diet and medication regimen   Abdominal  pain, other specified site   Aspiration pneumonia   Gastroparesis   Protein-calorie malnutrition, severe   Time spent: 34  Pamella Pert, MD [facility] Pager [phone]. If 7 PM - 7 AM, please contact night-coverage at [email or web address], password [facility] 09/24/2013, 1:23 PM  LOS: 1 day

## 2013-09-24 NOTE — Progress Notes (Signed)
INITIAL NUTRITION ASSESSMENT  DOCUMENTATION CODES Per approved criteria  -Severe malnutrition in the context of acute illness or injury  Pt meets criteria for SEVERE MALNUTRITION in the context of ACUTE ILLNESS as evidenced by 6% weight loss in less than one month, estimated energy intake <50% of estimated energy needs for >5 days, and moderate fat and muscle wasting evidenced in physical exam .  INTERVENTION: Provide Glucerna Shakes BID Encourage PO intake  NUTRITION DIAGNOSIS: Inadequate oral intake related to varied appetite as evidenced by 6% weight loss in less than one month.   Goal: Pt to meet >/= 90% of their estimated nutrition needs   Monitor:  PO intake Weight Labs  Reason for Assessment: Malnutrition Screening Tool, score of 5  27 y.o. male  Admitting Dx: <principal problem not specified>  ASSESSMENT: 27 y.o. male with past medical history significant for diabetes mellitus with Severe diabetic gastroparesis & neuropathy, GERD presents with above complaints. He states that he has had worsening abdominal pain along with nausea vomiting for the past week.  Pt reports that his appetite and PO intake varies greatly week to week. He states his weight fluctuates depending on edema and recent PO intake. Pt reports feeling very weak most of the time; he reports going a whole week at a time with very little PO intake. Obvious muscle wasting evidenced in physical exam. Encouraged pt to drink nutritional supplements and snack as much as tolerated during those times. Pt reports eating poorly this past week due to coughing and vomiting. Weight history shows 6% weight loss in the past month. Pt reports his appetite is fair today; eating at time of visit.  Pt seen by RD during previous admission and provided diet tips for gastroparesis. Reviewed information with pt; denies any additional questions or concerns at this time.   Nutrition Focused Physical Exam:  Subcutaneous Fat:   Orbital Region: moderate wasting Upper Arm Region: mild wasting Thoracic and Lumbar Region: NA  Muscle:  Temple Region: mild wasting Clavicle Bone Region: moderate wasting Clavicle and Acromion Bone Region: moderate wasting Scapular Bone Region: mild wasting Dorsal Hand: mild wasting Patellar Region: moderate wasting Anterior Thigh Region: moderate wasting Posterior Calf Region: mild wasting  Edema: none   Height: Ht Readings from Last 1 Encounters:  09/23/13 5\' 8"  (1.727 m)    Weight: Wt Readings from Last 1 Encounters:  09/23/13 130 lb (58.968 kg)    Ideal Body Weight: 154 lbs  % Ideal Body Weight: 84%  Wt Readings from Last 10 Encounters:  09/23/13 130 lb (58.968 kg)  09/13/13 136 lb (61.689 kg)  08/26/13 139 lb (63.05 kg)  08/21/13 125 lb (56.7 kg)  08/05/13 125 lb (56.7 kg)  07/06/13 125 lb (56.7 kg)  06/30/13 125 lb 3.5 oz (56.8 kg)  06/30/13 124 lb (56.246 kg)  06/26/13 127 lb (57.607 kg)  06/02/13 121 lb (54.885 kg)    Usual Body Weight: 157 lbs (April 2014)  % Usual Body Weight: 82%  BMI:  Body mass index is 19.77 kg/(m^2).  Estimated Nutritional Needs: Kcal: 1800-2000 Protein: 70-80 grams Fluid: 2 L/day  Skin: intact  Diet Order: Carb Control  EDUCATION NEEDS: -No education needs identified at this time   Intake/Output Summary (Last 24 hours) at 09/24/13 1114 Last data filed at 09/24/13 0558  Gross per 24 hour  Intake 1558.33 ml  Output    850 ml  Net 708.33 ml    Last BM: PTA  Labs:   Recent Labs Lab 09/23/13  1522 09/23/13 2143 09/24/13 0349  NA 126* 126* 128*  K 4.6 4.9 4.3  CL 93* 95* 98  CO2 23 23 23   BUN 12 12 10   CREATININE 1.11 1.03 1.00  CALCIUM 9.0 8.7 8.7  GLUCOSE 367* 564* 209*    CBG (last 3)   Recent Labs  09/24/13 0828 09/24/13 0849 09/24/13 0917  GLUCAP 65* 42* 72    Scheduled Meds: . clindamycin (CLEOCIN) IV  600 mg Intravenous Q8H  . dextrose 5 % and 0.45% NaCl  500 mL Intravenous Once   . enoxaparin (LOVENOX) injection  40 mg Subcutaneous Q24H  . insulin aspart  0-15 Units Subcutaneous TID WC  . insulin aspart  0-5 Units Subcutaneous QHS  . insulin glargine  10 Units Subcutaneous QHS  . levofloxacin (LEVAQUIN) IV  750 mg Intravenous Q24H  . metoCLOPramide (REGLAN) injection  5 mg Intravenous TID AC  . morphine  30 mg Oral Q12H  . pantoprazole (PROTONIX) IV  40 mg Intravenous QHS  . pregabalin  100 mg Oral TID  . vancomycin  750 mg Intravenous Q8H    Continuous Infusions: . sodium chloride 125 mL/hr at 09/23/13 2353    Past Medical History  Diagnosis Date  . Diabetes mellitus   . GERD (gastroesophageal reflux disease)   . Asthma   . Hx MRSA infection     on face  . Gastroparesis   . Diabetic neuropathy   . Seizures     Past Surgical History  Procedure Laterality Date  . Tonsillectomy      Ian Malkin RD, LDN Inpatient Clinical Dietitian Pager: [phone] After Hours Pager: [phone]

## 2013-09-24 NOTE — Progress Notes (Signed)
Inpatient Diabetes Program Recommendations  AACE/ADA: New Consensus Statement on Inpatient Glycemic Control (2013)  Target Ranges:  Prepandial:   less than 140 mg/dL      Peak postprandial:   less than 180 mg/dL (1-2 hours)      Critically ill patients:  140 - 180 mg/dL   Reason for Visit: Hyperglycemia and Hypoglycemia  Pt's blood sugars 24, 65, 42 this am. Received home dose of Lantus last pm.  Doubtful pt had been taking insulin.  Inpatient Diabetes Program Recommendations Insulin - Basal: Lantus 16 units QHS (half home dose) Correction (SSI): Novolog sensitive tidwc (since pt is sensitive to insulin Insulin - Meal Coverage: Needs meal coverage insulin - Recommend Novolog 4 units tidwc if pt eats >50% meal HgbA1C: 13.3% - uncontrolled  Note: Needs case manager consult for resources if pt is denied Medicaid.  Will need to be discharged on less expensive insulin such as ReliOn 70/30 if unable to get coverage.   Will also need prescription for strips for glucometer.  From previous visit - 04/13/2013.  Noted in patient's chart that he has no insurance and has Lantus and Novolog as his home medications. In talking with the patient, he reports that he has exhausted his resources (family and friends) in helping him purchase his insulins. Patient reports that he has tried several other insulins in the past and they do not keep his blood sugar controlled as well as Lantus and Novolog. He prefers to stay on Lantus and Novolog at this time. Patient reports that he has already applied for Medicaid and is awaiting to hear from them. Patient requested assistance with getting Lantus and Novolog medication vouchers. Also informed the patient about [facility] as a resource he may want to consider if he needs to see a doctor before he gets approved for Medicaid. Consulted Case Management for medication assistance.   Will continue to follow. Thank you. Ailene Ards, RD, LDN,  CDE Inpatient Diabetes Coordinator [phone]

## 2013-09-24 NOTE — Progress Notes (Signed)
Hypoglycemic Event  CBG: 42  Treatment: D50 IV 25 mL  Symptoms: Shaky  Follow-up CBG: Time: 0917 CBG Result: 72  Possible Reasons for Event: Inadequate meal intake  Comments/MD notified:Dr Gherge on floor and notified.    Jason [name]  Remember to initiate Hypoglycemia Order Set & complete

## 2013-09-24 NOTE — Progress Notes (Signed)
Pt's CBG for 2200 was 499.  MD notified and orders received for 500 cc bolus and 10 units novolog x1.  Lantus 30 units was also administered per order.  Patient's CBG was in the 90's this am.  Will continue to monitor.Kenton Kingfisher [place]

## 2013-09-24 NOTE — Progress Notes (Addendum)
Pt. with CBG of 24 at 0800, received 1/2 amp (25 cc) of D50 by iv at 0810. Pt sitting up in bed, eating 4 squares graham crax with 2 oz. peanut butter with 8 oz milk. CBG recheck at 0825, was found to be still sub-therapeutic; MD informed, orders received for D5-1/2Normal saline bolus. CBG remained within therapeutic range subsequently

## 2013-09-25 ENCOUNTER — Inpatient Hospital Stay (HOSPITAL_COMMUNITY): Payer: Medicaid Other

## 2013-09-25 ENCOUNTER — Other Ambulatory Visit: Payer: Self-pay

## 2013-09-25 DIAGNOSIS — J9600 Acute respiratory failure, unspecified whether with hypoxia or hypercapnia: Secondary | ICD-10-CM

## 2013-09-25 LAB — CBC
HCT: 26 % — ABNORMAL LOW (ref 39.0–52.0)
HCT: 27.5 % — ABNORMAL LOW (ref 39.0–52.0)
Hemoglobin: 8.6 g/dL — ABNORMAL LOW (ref 13.0–17.0)
Hemoglobin: 9.4 g/dL — ABNORMAL LOW (ref 13.0–17.0)
MCH: 26.6 pg (ref 26.0–34.0)
MCHC: 33.1 g/dL (ref 30.0–36.0)
Platelets: 561 10*3/uL — ABNORMAL HIGH (ref 150–400)
RBC: 3.53 MIL/uL — ABNORMAL LOW (ref 4.22–5.81)
RDW: 14.7 % (ref 11.5–15.5)
WBC: 31.9 10*3/uL — ABNORMAL HIGH (ref 4.0–10.5)

## 2013-09-25 LAB — BLOOD GAS, ARTERIAL
Bicarbonate: 20.9 mEq/L (ref 20.0–24.0)
Drawn by: 29501
O2 Content: 5 L/min
O2 Saturation: 81.8 %
TCO2: 19.1 mmol/L (ref 0–100)
pCO2 arterial: 33.4 mmHg — ABNORMAL LOW (ref 35.0–45.0)
pH, Arterial: 7.413 (ref 7.350–7.450)
pO2, Arterial: 49.5 mmHg — ABNORMAL LOW (ref 80.0–100.0)

## 2013-09-25 LAB — COMPREHENSIVE METABOLIC PANEL
Albumin: 1.7 g/dL — ABNORMAL LOW (ref 3.5–5.2)
Alkaline Phosphatase: 153 U/L — ABNORMAL HIGH (ref 39–117)
BUN: 13 mg/dL (ref 6–23)
CO2: 20 mEq/L (ref 19–32)
Calcium: 8.3 mg/dL — ABNORMAL LOW (ref 8.4–10.5)
Chloride: 99 mEq/L (ref 96–112)
Creatinine, Ser: 1.08 mg/dL (ref 0.50–1.35)
GFR calc Af Amer: >90 mL/min (ref >90)
GFR calc non Af Amer: >90 mL/min (ref >90)
Potassium: 4.2 mEq/L (ref 3.5–5.1)
Total Bilirubin: 0.3 mg/dL (ref 0.3–1.2)

## 2013-09-25 LAB — MRSA PCR SCREENING: MRSA by PCR: NEGATIVE

## 2013-09-25 LAB — BASIC METABOLIC PANEL
BUN: 9 mg/dL (ref 6–23)
CO2: 22 mEq/L (ref 19–32)
GFR calc non Af Amer: >90 mL/min (ref >90)
Glucose, Bld: 83 mg/dL (ref 70–99)
Potassium: 3.6 mEq/L (ref 3.5–5.1)
Sodium: 129 mEq/L — ABNORMAL LOW (ref 135–145)

## 2013-09-25 LAB — GLUCOSE, CAPILLARY
Glucose-Capillary: 82 mg/dL (ref 70–99)
Glucose-Capillary: 189 mg/dL — ABNORMAL HIGH (ref 70–99)

## 2013-09-25 MED ORDER — SODIUM CHLORIDE 0.9 % IV SOLN
1.0000 g | Freq: Three times a day (TID) | INTRAVENOUS | Status: DC
  Administered 2013-09-25 – 2013-09-27 (×5): 1 g via INTRAVENOUS
  Filled 2013-09-25 (×7): qty 1

## 2013-09-25 MED ORDER — LEVOFLOXACIN 750 MG PO TABS
750.0000 mg | ORAL_TABLET | Freq: Every day | ORAL | Status: DC
Start: 2013-09-25 — End: 2013-09-29
  Administered 2013-09-25 – 2013-09-28 (×4): 750 mg via ORAL
  Filled 2013-09-25 (×5): qty 1

## 2013-09-25 MED ORDER — VANCOMYCIN HCL IN DEXTROSE 750-5 MG/150ML-% IV SOLN
750.0000 mg | Freq: Three times a day (TID) | INTRAVENOUS | Status: DC
  Administered 2013-09-25 – 2013-09-27 (×5): 750 mg via INTRAVENOUS
  Filled 2013-09-25 (×7): qty 150

## 2013-09-25 MED ORDER — HYDROMORPHONE HCL PF 1 MG/ML IJ SOLN
0.5000 mg | INTRAMUSCULAR | Status: DC | PRN
  Administered 2013-09-25 – 2013-09-26 (×5): 0.5 mg via INTRAVENOUS
  Filled 2013-09-25 (×5): qty 1

## 2013-09-25 NOTE — Progress Notes (Signed)
PROGRESS NOTE  Jason [name] ZOX:[id number] DOB: November 09, 1985 DOA: 09/23/2013 PCP: Nelwyn Salisbury, MD  Assessment/Plan: Aspiration pneumonia - suspect from recurrent vomiting and gastroparesis, and severe coughing. Vanc/Levo/Clinda, narrow to Levofloxacin today. Severe gastroparesis - last EGD 7/14 c/w this diagnosis. Tolerating diet well.  Anemia - Hb stable DM -uncontrolled and brittle mainly because patient is using Lantus as meal coverage and not truly as basal.  Abdominal pain -acute on chronic, due to 1,2  -continue long acting pain meds per home regimen, Avinza not on formulary and hence substituted  -long term challenge, complicates management of gastroparesis  Hyponatremia -due to volume depletion  -hydrate and monitor   Diet: advance as tolerated Fluids: NS DVT Prophylaxis: Lovenox  Code Status: Full Family Communication: none  Disposition Plan: inpatient, home when ready  Consultants:  none  Procedures:  none   Antibiotics  Anti-infectives   Start     Dose/Rate Route Frequency Ordered Stop   09/25/13 1100  levofloxacin (LEVAQUIN) tablet 750 mg     750 mg Oral Daily 09/25/13 0956     09/24/13 1600  levofloxacin (LEVAQUIN) IVPB 750 mg  Status:  Discontinued     750 mg 100 mL/hr over 90 Minutes Intravenous Every 24 hours 09/23/13 1935 09/25/13 0956   09/24/13 0200  vancomycin (VANCOCIN) IVPB 750 mg/150 ml premix  Status:  Discontinued     750 mg 150 mL/hr over 60 Minutes Intravenous Every 8 hours 09/23/13 1907 09/25/13 0948   09/23/13 2000  clindamycin (CLEOCIN) IVPB 600 mg  Status:  Discontinued     600 mg 100 mL/hr over 30 Minutes Intravenous 3 times per day 09/23/13 1935 09/25/13 0947   09/23/13 1915  vancomycin (VANCOCIN) IVPB 750 mg/150 ml premix     750 mg 150 mL/hr over 60 Minutes Intravenous  Once 09/23/13 1907 09/23/13 2054   09/23/13 1730  clindamycin (CLEOCIN) IVPB 600 mg  Status:  Discontinued     600 mg 100 mL/hr over 30 Minutes Intravenous   Once 09/23/13 1712 09/23/13 1850   09/23/13 1715  levofloxacin (LEVAQUIN) IVPB 750 mg     750 mg 100 mL/hr over 90 Minutes Intravenous  Once 09/23/13 1712 09/23/13 1905     Antibiotics Given (last 72 hours)   Date/Time Action Medication Dose Rate   09/23/13 1954 Given   vancomycin (VANCOCIN) IVPB 750 mg/150 ml premix 750 mg 150 mL/hr   09/23/13 2100 Given   clindamycin (CLEOCIN) IVPB 600 mg 600 mg 100 mL/hr   09/24/13 0236 Given   vancomycin (VANCOCIN) IVPB 750 mg/150 ml premix 750 mg 150 mL/hr   09/24/13 0558 Given   clindamycin (CLEOCIN) IVPB 600 mg 600 mg 100 mL/hr   09/24/13 1244 Given   vancomycin (VANCOCIN) IVPB 750 mg/150 ml premix 750 mg 150 mL/hr   09/24/13 1541 Given   clindamycin (CLEOCIN) IVPB 600 mg 600 mg 100 mL/hr   09/24/13 1635 Given   levofloxacin (LEVAQUIN) IVPB 750 mg 750 mg 100 mL/hr   09/24/13 2017 Given   vancomycin (VANCOCIN) IVPB 750 mg/150 ml premix 750 mg 150 mL/hr   09/24/13 2253 Given   clindamycin (CLEOCIN) IVPB 600 mg 600 mg 100 mL/hr   09/25/13 0408 Given   vancomycin (VANCOCIN) IVPB 750 mg/150 ml premix 750 mg 150 mL/hr   09/25/13 0554 Given   clindamycin (CLEOCIN) IVPB 600 mg 600 mg 100 mL/hr      HPI/Subjective: - feeling better this morning  Objective: Filed Vitals:   09/24/13 2140 09/25/13  0158 09/25/13 0525 09/25/13 0745  BP: 107/67  112/70   Pulse: 100  106   Temp: 100.1 F (37.8 C) 100.2 F (37.9 C) 100.7 F (38.2 C) 101.3 F (38.5 C)  TempSrc: Oral  Oral Oral  Resp: 18  20   Height:      Weight:      SpO2: 100%  100%     Intake/Output Summary (Last 24 hours) at 09/25/13 1121 Last data filed at 09/25/13 0745  Gross per 24 hour  Intake 5062.91 ml  Output   1100 ml  Net 3962.91 ml   Filed Weights   09/23/13 1849  Weight: 58.968 kg (130 lb)    Exam:  General:  NAD  Cardiovascular: regular rate and rhythm, without MRG  Respiratory: bilateral rhonchi, improved this morning   Abdomen: soft, not tender to  palpation, positive bowel sounds  MSK: no peripheral edema  Neuro: CN 2-12 grossly intact, MS 5/5 in all 4  Data Reviewed: Basic Metabolic Panel:  Recent Labs Lab 09/22/13 0652 09/23/13 1522 09/23/13 2143 09/24/13 0349 09/25/13 0520  NA 137 126* 126* 128* 129*  K 3.8 4.6 4.9 4.3 3.6  CL 100 93* 95* 98 99  CO2 27 23 23 23 22   GLUCOSE 49* 367* 564* 209* 83  BUN 9 12 12 10 9   CREATININE 0.90 1.11 1.03 1.00 1.00  CALCIUM 9.2 9.0 8.7 8.7 8.3*   Liver Function Tests:  Recent Labs Lab 09/22/13 0652 09/23/13 1522  AST 12 9  ALT 5 <5  ALKPHOS 143* 149*  BILITOT 0.4 0.4  PROT 8.1 7.1  ALBUMIN 2.6* 2.2*    Recent Labs Lab 09/22/13 0652  LIPASE 11   CBC:  Recent Labs Lab 09/22/13 0652 09/23/13 1522 09/24/13 0349 09/25/13 0520  WBC 32.2* 29.6* 27.2* 28.9*  NEUTROABS 27.0* 23.6*  --   --   HGB 11.5* 9.5* 8.6* 8.6*  HCT 34.9* 28.5* 26.1* 26.0*  MCV 79.9 78.3 78.4 78.1  PLT 622* 625* 591* 561*   CBG:  Recent Labs Lab 09/24/13 1205 09/24/13 1914 09/24/13 2142 09/25/13 0519 09/25/13 0729  GLUCAP 106* 139* 123* 82 107*    Recent Results (from the past 240 hour(s))  CULTURE, EXPECTORATED SPUTUM-ASSESSMENT     Status: None   Collection Time    09/24/13  6:02 AM      Result Value Range Status   Specimen Description SPUTUM   Final   Special Requests Normal   Final   Sputum evaluation     Final   Value: THIS SPECIMEN IS ACCEPTABLE. RESPIRATORY CULTURE REPORT TO FOLLOW.   Report Status 09/24/2013 FINAL   Final  CULTURE, RESPIRATORY (NON-EXPECTORATED)     Status: None   Collection Time    09/24/13  6:02 AM      Result Value Range Status   Specimen Description SPUTUM   Final   Special Requests NONE   Final   Gram Stain     Final   Value: FEW WBC PRESENT, PREDOMINANTLY PMN     RARE SQUAMOUS EPITHELIAL CELLS PRESENT     FEW YEAST     FEW GRAM POSITIVE RODS     Performed at [facility]   Culture     Final   Value: MODERATE CANDIDA ALBICANS      Performed at [facility]   Report Status PENDING   Incomplete  MRSA PCR SCREENING     Status: None   Collection Time    09/24/13  7:09 AM      Result Value Range Status   MRSA by PCR NEGATIVE  NEGATIVE Final   Comment:            The GeneXpert MRSA Assay (FDA     approved for NASAL specimens     only), is one component of a     comprehensive MRSA colonization     surveillance program. It is not     intended to diagnose MRSA     infection nor to guide or     monitor treatment for     MRSA infections.     Studies: No results found.  Scheduled Meds: . enoxaparin (LOVENOX) injection  40 mg Subcutaneous Q24H  . feeding supplement (GLUCERNA SHAKE)  237 mL Oral BID BM & HS  . insulin aspart  0-15 Units Subcutaneous TID WC  . insulin aspart  0-5 Units Subcutaneous QHS  . insulin glargine  10 Units Subcutaneous QHS  . levofloxacin  750 mg Oral Daily  . metoCLOPramide (REGLAN) injection  5 mg Intravenous TID AC  . morphine  30 mg Oral Q12H  . pantoprazole (PROTONIX) IV  40 mg Intravenous QHS  . pregabalin  100 mg Oral TID  . silver sulfADIAZINE   Topical Daily   Continuous Infusions: . sodium chloride 1,000 mL (09/25/13 0150)    Active Problems:   Type 1 diabetes, uncontrolled, with neuropathy   Nausea and vomiting   Noncompliance with diet and medication regimen   Abdominal  pain, other specified site   Aspiration pneumonia   Gastroparesis   Protein-calorie malnutrition, severe  Time spent: 25  Jason Pert, MD [facility] Pager [phone]. If 7 PM - 7 AM, please contact night-coverage at [email or web address], password [facility] 09/25/2013, 11:21 AM  LOS: 2 days

## 2013-09-25 NOTE — Progress Notes (Signed)
ANTIBIOTIC CONSULT NOTE - INITIAL  Pharmacy Consult for Vancomycin, meropenem Indication: rule out pneumonia (aspiration)  Allergies  Allergen Reactions  . Cefuroxime Axetil Anaphylaxis  . Penicillins Anaphylaxis    ?can take amoxicillin?  Kimberlee Nearing [Benzonatate] Anaphylaxis  . Shellfish Allergy Itching    Patient Measurements: Height: 5\' 8"  (172.7 cm) Weight: 130 lb (58.968 kg) IBW/kg (Calculated) : 68.4  Vital Signs: Temp: 101.4 F (38.6 C) (12/19 1827) Temp src: Oral (12/19 1348) BP: 120/60 mmHg (12/19 1348) Pulse Rate: 117 (12/19 1348)  Labs:  Recent Labs  09/23/13 1522 09/23/13 2143 09/24/13 0349 09/25/13 0520  WBC 29.6*  --  27.2* 28.9*  HGB 9.5*  --  8.6* 8.6*  PLT 625*  --  591* 561*  CREATININE 1.11 1.03 1.00 1.00   Estimated Creatinine Clearance: 91 ml/min (by C-G formula based on Cr of 1).   Microbiology: 12/18: sputum: moderate candida albicans- pending  Assessment: 27 yo M admitted 12/17 with r/o aspiration pneumonia.  He was seen at [facility] on 12/16 and diagnosed with pneumonia; at home he had nausea and vomiting and did not get antibiotic Rx filled.  PMH includes severe diabetic gastroparesis and neuropathy, GERD.  Pharmacy is consulted to dose vancomycin, meropenem, and levofloxacin  Note multiple antibiotic allergies:  Cefuroxime (anaphylaxis), Penicillins (anaphylaxis)  Patient had been on vancomycin, levofloxacin, and clindamycin since 12/17 and continues to spike fevers  Tmax: 101.4  WBCs: 28.9  Renal:  SCr 1.0, CrCl ~ 90 ml/min  CXR highly suspicious for aspiration pneumonia with bilateral, right greater than left lower lobe infiltrates.  Goal of Therapy:  Vancomycin trough level 15-20 mcg/ml Appropriate abx dosing, eradication of infection.   Plan:   Continue Levaquin 750mg  PO q24h  Start meropenem 1g IV q8h  Resume vancomycin 750mg  IV q8h  Measure Vanc trough at steady state  Follow up renal fxn and culture results    Thank you for the consult.  Tomi Bamberger, PharmD, BCPS Clinical Pharmacist Pager: [phone] Pharmacy: [phone] 09/25/2013 7:19 PM

## 2013-09-25 NOTE — Progress Notes (Signed)
CBG was 82 this am. Given apple juice and a regular ginger ale to "last til breakfast"

## 2013-09-25 NOTE — Progress Notes (Signed)
Patient admitted to ICU/SD after rapid response was called. On arrival to ICU/SD, pt was noted to be drowsy but would wake with stimulation. Pt is oriented x4 and able to verbalize needs. Jason Billings NP has been at bedside to assess patient. CXR and blood cultures complete. Please refer to charting for further assessment.
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
## 2013-09-25 NOTE — Progress Notes (Signed)
Patient has been sleeping most of afternoon, arousable and able to answer simple question,too lethargic to eat dinner, blood sugar checked it was 180, patient hypoxic o2 sat 78% on room air , o2 by [place] applied at 5l.02 sat up to 96% on 5l/[place].Rapid response called, patient placed on 100 %nrm and eventally transferred to stepdown unit fo0r monitoring, Dr Elvera Lennox notified about pt's condition. Father at bedside.

## 2013-09-25 NOTE — Care Management Note (Signed)
Cm spoke with patient at the bedside concerning self payer status and no PCP on record. Per pt Medicaid application pending. Resides with parents whom assist with medication cost. Cm provided pt with information concerning [employer] and other community resources. No other barriers identified at this time. Financial Counselor to consult.    Roxy Manns Lasalle Abee,MSN,RN [phone]

## 2013-09-25 NOTE — Progress Notes (Signed)
Attempted 5L [place] and pt's oxygen saturations were 88-90, attempted Venturi Mask 50% FiO2, oxygen saturation was 88-91%. Pt placed back on non re-breather at this time. Critical Care MD aware.

## 2013-09-25 NOTE — Progress Notes (Signed)
Temp 100.7. Paged NP on call. Order to give tylenol. No blood cultures pending though. NP aware.
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
## 2013-09-26 ENCOUNTER — Inpatient Hospital Stay (HOSPITAL_COMMUNITY): Payer: Medicaid Other

## 2013-09-26 DIAGNOSIS — J690 Pneumonitis due to inhalation of food and vomit: Principal | ICD-10-CM

## 2013-09-26 DIAGNOSIS — J9601 Acute respiratory failure with hypoxia: Secondary | ICD-10-CM | POA: Diagnosis present

## 2013-09-26 DIAGNOSIS — R609 Edema, unspecified: Secondary | ICD-10-CM

## 2013-09-26 DIAGNOSIS — I517 Cardiomegaly: Secondary | ICD-10-CM

## 2013-09-26 LAB — GLUCOSE, CAPILLARY
Glucose-Capillary: 162 mg/dL — ABNORMAL HIGH (ref 70–99)
Glucose-Capillary: 28 mg/dL — CL (ref 70–99)
Glucose-Capillary: 107 mg/dL — ABNORMAL HIGH (ref 70–99)
Glucose-Capillary: 93 mg/dL (ref 70–99)
Glucose-Capillary: 95 mg/dL (ref 70–99)

## 2013-09-26 LAB — BLOOD GAS, ARTERIAL
Acid-base deficit: 3.6 mmol/L — ABNORMAL HIGH (ref 0.0–2.0)
Bicarbonate: 19.8 mEq/L — ABNORMAL LOW (ref 20.0–24.0)
Delivery systems: POSITIVE
Expiratory PAP: 5
FIO2: 1 %
Patient temperature: 98.6
TCO2: 18.3 mmol/L (ref 0–100)
pCO2 arterial: 31.4 mmHg — ABNORMAL LOW (ref 35.0–45.0)
pH, Arterial: 7.415 (ref 7.350–7.450)
pO2, Arterial: 69.3 mmHg — ABNORMAL LOW (ref 80.0–100.0)

## 2013-09-26 LAB — CULTURE, RESPIRATORY W GRAM STAIN

## 2013-09-26 LAB — CBC
HCT: 29.8 % — ABNORMAL LOW (ref 39.0–52.0)
MCH: 26.2 pg (ref 26.0–34.0)
MCV: 77.2 fL — ABNORMAL LOW (ref 78.0–100.0)
RBC: 3.86 MIL/uL — ABNORMAL LOW (ref 4.22–5.81)
RDW: 14.6 % (ref 11.5–15.5)
WBC: 32.6 10*3/uL — ABNORMAL HIGH (ref 4.0–10.5)

## 2013-09-26 LAB — CULTURE, RESPIRATORY

## 2013-09-26 LAB — URINALYSIS, ROUTINE W REFLEX MICROSCOPIC
Bilirubin Urine: NEGATIVE
Ketones, ur: NEGATIVE mg/dL
Nitrite: NEGATIVE
Urobilinogen, UA: 0.2 mg/dL (ref 0.0–1.0)
pH: 5.5 (ref 5.0–8.0)

## 2013-09-26 LAB — URINE MICROSCOPIC-ADD ON

## 2013-09-26 LAB — INFLUENZA PANEL BY PCR (TYPE A & B)
H1N1 flu by pcr: NOT DETECTED
Influenza A By PCR: NEGATIVE
Influenza B By PCR: NEGATIVE

## 2013-09-26 LAB — STREP PNEUMONIAE URINARY ANTIGEN: Strep Pneumo Urinary Antigen: NEGATIVE

## 2013-09-26 LAB — VANCOMYCIN, TROUGH: Vancomycin Tr: 17.8 ug/mL (ref 10.0–20.0)

## 2013-09-26 MED ORDER — CHLORHEXIDINE GLUCONATE 0.12 % MT SOLN
15.0000 mL | Freq: Two times a day (BID) | OROMUCOSAL | Status: DC
  Administered 2013-09-27 – 2013-10-02 (×12): 15 mL via OROMUCOSAL
  Filled 2013-09-26 (×12): qty 15

## 2013-09-26 MED ORDER — SODIUM CHLORIDE 0.9 % IJ SOLN: 10.0000 mL | INTRAMUSCULAR | Status: DC | PRN

## 2013-09-26 MED ORDER — DEXTROSE 50 % IV SOLN
50.0000 mL | Freq: Once | INTRAVENOUS | Status: AC | PRN
  Administered 2013-09-26: 25 mL via INTRAVENOUS
  Filled 2013-09-26 (×2): qty 50

## 2013-09-26 MED ORDER — HYDROMORPHONE HCL PF 1 MG/ML IJ SOLN
0.5000 mg | INTRAMUSCULAR | Status: DC | PRN
  Administered 2013-09-26 – 2013-09-28 (×10): 0.5 mg via INTRAVENOUS
  Filled 2013-09-26 (×11): qty 1

## 2013-09-26 MED ORDER — OXYCODONE HCL 5 MG PO TABS
5.0000 mg | ORAL_TABLET | Freq: Four times a day (QID) | ORAL | Status: DC | PRN
  Administered 2013-09-26 – 2013-09-28 (×5): 5 mg via ORAL
  Filled 2013-09-26 (×5): qty 1

## 2013-09-26 MED ORDER — DEXTROSE-NACL 5-0.45 % IV SOLN
INTRAVENOUS | Status: DC
  Administered 2013-09-26: 50 mL/h via INTRAVENOUS

## 2013-09-26 MED ORDER — SODIUM CHLORIDE 0.9 % IJ SOLN
10.0000 mL | Freq: Two times a day (BID) | INTRAMUSCULAR | Status: DC
  Administered 2013-09-26 – 2013-10-04 (×13): 10 mL

## 2013-09-26 MED ORDER — INSULIN ASPART 100 UNIT/ML ~~LOC~~ SOLN
0.0000 [IU] | SUBCUTANEOUS | Status: DC
  Administered 2013-09-27 (×4): 3 [IU] via SUBCUTANEOUS
  Administered 2013-09-28: 5 [IU] via SUBCUTANEOUS
  Administered 2013-09-28: 2 [IU] via SUBCUTANEOUS
  Administered 2013-09-28 (×2): 3 [IU] via SUBCUTANEOUS
  Administered 2013-09-28: 2 [IU] via SUBCUTANEOUS
  Administered 2013-09-28 – 2013-09-29 (×3): 3 [IU] via SUBCUTANEOUS
  Administered 2013-09-29: 5 [IU] via SUBCUTANEOUS
  Administered 2013-09-29 – 2013-09-30 (×2): 11 [IU] via SUBCUTANEOUS
  Administered 2013-09-30: 15 [IU] via SUBCUTANEOUS
  Administered 2013-09-30: 3 [IU] via SUBCUTANEOUS
  Administered 2013-09-30: 11 [IU] via SUBCUTANEOUS
  Administered 2013-09-30: 5 [IU] via SUBCUTANEOUS
  Administered 2013-09-30 – 2013-10-01 (×3): 8 [IU] via SUBCUTANEOUS
  Administered 2013-10-01 (×2): 5 [IU] via SUBCUTANEOUS
  Administered 2013-10-01: 8 [IU] via SUBCUTANEOUS
  Administered 2013-10-01 – 2013-10-02 (×3): 5 [IU] via SUBCUTANEOUS
  Administered 2013-10-02: 3 [IU] via SUBCUTANEOUS
  Administered 2013-10-02: 5 [IU] via SUBCUTANEOUS
  Administered 2013-10-02: 3 [IU] via SUBCUTANEOUS

## 2013-09-26 MED ORDER — FUROSEMIDE 10 MG/ML IJ SOLN
40.0000 mg | Freq: Once | INTRAMUSCULAR | Status: AC
  Administered 2013-09-26: 40 mg via INTRAVENOUS
  Filled 2013-09-26: qty 4

## 2013-09-26 MED ORDER — DEXTROSE 50 % IV SOLN
INTRAVENOUS | Status: AC
  Administered 2013-09-26: 50 mL
  Filled 2013-09-26: qty 50

## 2013-09-26 NOTE — Progress Notes (Signed)
Peripherally Inserted Central Catheter/Midline Placement  The IV Nurse has discussed with the patient and/or persons authorized to consent for the patient, the purpose of this procedure and the potential benefits and risks involved with this procedure.  The benefits include less needle sticks, lab draws from the catheter and patient may be discharged home with the catheter.  Risks include, but not limited to, infection, bleeding, blood clot (thrombus formation), and puncture of an artery; nerve damage and irregular heat beat.  Alternatives to this procedure were also discussed.  PICC/Midline Placement Documentation  PICC / Midline Double Lumen 09/26/13 PICC Right Basilic 37 cm 2 cm (Active)  Indication for Insertion or Continuance of Line Poor Vasculature-patient has had multiple peripheral attempts or PIVs lasting less than 24 hours 09/26/2013  3:11 PM  Exposed Catheter (cm) 2 cm 09/26/2013  3:11 PM  Site Assessment Clean;Dry;Intact 09/26/2013  3:11 PM  Lumen #1 Status Flushed;Saline locked;Blood return noted 09/26/2013  3:11 PM  Lumen #2 Status Flushed;Saline locked;Blood return noted 09/26/2013  3:11 PM  Dressing Type Transparent 09/26/2013  3:11 PM  Dressing Status Clean;Dry;Intact;Antimicrobial disc in place 09/26/2013  3:11 PM  Dressing Intervention New dressing 09/26/2013  3:11 PM  Dressing Change Due 10/03/13 09/26/2013  3:11 PM       Ethelda Chick 09/26/2013, 3:19 PM

## 2013-09-26 NOTE — ED Provider Notes (Signed)
Medical screening examination/treatment/procedure(s) were performed by non-physician practitioner and as supervising physician I was immediately available for consultation/collaboration.  EKG Interpretation    Date/Time:    Ventricular Rate:    PR Interval:    QRS Duration:   QT Interval:    QTC Calculation:   R Axis:     Text Interpretation:                Roney Marion, MD 09/26/13 [phone]

## 2013-09-26 NOTE — Progress Notes (Signed)
Hypoglycemic Event  CBG: 28  Treatment: 1 amp dextrose   Symptoms: none  (patient stated he felt "low")  Follow-up CBG: Time 1345 CBG Result:107  Possible Reasons for Event: type 1 diabetic, not eating   Comments/MD notified:Ramaswammy     Patrina Levering  Remember to initiate Hypoglycemia Order Set & complete

## 2013-09-26 NOTE — Progress Notes (Signed)
ANTIBIOTIC CONSULT NOTE - FOLLOW UP  Pharmacy Consult for Vancomycin, meropenem Indication: rule out pneumonia (aspiration)  Allergies  Allergen Reactions  . Cefuroxime Axetil Anaphylaxis  . Penicillins Anaphylaxis    ?can take amoxicillin?  Kimberlee Nearing [Benzonatate] Anaphylaxis  . Shellfish Allergy Itching    Patient Measurements: Height: 5\' 8"  (172.7 cm) Weight: 129 lb 10.1 oz (58.8 kg) IBW/kg (Calculated) : 68.4  Vital Signs: Temp: 99.3 F (37.4 C) (12/20 1600) Temp src: Axillary (12/20 1600) BP: 105/69 mmHg (12/20 2100) Pulse Rate: 106 (12/20 2100)  Labs:  Recent Labs  09/24/13 0349 09/25/13 0520 09/25/13 2017 09/26/13 0356  WBC 27.2* 28.9* 31.9* 32.6*  HGB 8.6* 8.6* 9.4* 10.1*  PLT 591* 561* 544* 664*  CREATININE 1.00 1.00 1.08  --    Estimated Creatinine Clearance: 83.9 ml/min (by C-G formula based on Cr of 1.08).   Microbiology: 12/18: sputum: moderate candida albicans- pending 12/19: blood x 2 : pending  Assessment: 27 yo M admitted 12/17 with r/o aspiration pneumonia.  He was seen at [facility] on 12/16 and diagnosed with pneumonia; at home he had nausea and vomiting and did not get antibiotic Rx filled.  PMH includes severe diabetic gastroparesis and neuropathy, GERD.  Pharmacy is consulted to dose vancomycin, meropenem, and levofloxacin  Note multiple antibiotic allergies:  Cefuroxime (anaphylaxis), Penicillins (anaphylaxis)  Patient had been on vancomycin, levofloxacin, and clindamycin since 12/17 and continues to spike fevers  Tmax: 99.3  WBCs: 32.6  Renal:  CrCl ~ 84 ml/min  CXR highly suspicious for aspiration pneumonia with bilateral, right greater than left lower lobe infiltrates.  Vancomycin trough = 17.8 mcg/ml  Goal of Therapy:  Vancomycin trough level 15-20 mcg/ml Appropriate abx dosing, eradication of infection.   Plan:   Continue Levaquin 750mg  PO q24h  Continue meropenem 1g IV q8h  Continue vancomycin 750mg  IV q8h  Follow up  culture results   Terrilee Files, PharmD  09/26/2013 9:59 PM

## 2013-09-26 NOTE — Progress Notes (Signed)
  Echocardiogram 2D Echocardiogram has been performed.  Jason [name] 09/26/2013, 11:02 AM

## 2013-09-26 NOTE — Progress Notes (Addendum)
PROGRESS NOTE  Jason [name] ZOX:[id number] DOB: [birth year] DOA: 09/23/2013 PCP: Nelwyn Salisbury, MD  Assessment/Plan: Aspiration pneumonia - suspect from recurrent vomiting and gastroparesis, and severe coughing.  - patient with hypoxia last night, CXR with bilateral airspace disease, now transferred to SDU and intermittently on NIPPV throughout the night.  - CCM consulted, appreciate input. No need for intubation currently but low threshold if his respiratory status does not improve.  - Continue Vancomycin and Meropenem for now (he is pen allergic) - check flu panel Hypoxic respiratory failure - due to #1. Checking 2D echo Severe gastroparesis - last EGD 7/14 c/w this diagnosis. Anemia - Hb stable DM -uncontrolled and brittle mainly because patient is using Lantus as meal coverage and not truly as basal.  - current Lantus regimen works well for fasting sugars, continue SSI for meal coverage Abdominal pain -acute on chronic, due to 1,2  - avoid for now long acting pain medications given respiratory status, change to low dose short acting.  Hyponatremia -due to volume depletion   Diet: advance as tolerated Fluids: NS DVT Prophylaxis: Lovenox  Code Status: Full Family Communication: none  Disposition Plan: inpatient  Consultants:  none  Procedures:  none   Antibiotics  Anti-infectives   Start     Dose/Rate Route Frequency Ordered Stop   09/25/13 2200  vancomycin (VANCOCIN) IVPB 750 mg/150 ml premix     750 mg 150 mL/hr over 60 Minutes Intravenous 3 times per day 09/25/13 1928     09/25/13 2000  meropenem (MERREM) 1 g in sodium chloride 0.9 % 100 mL IVPB     1 g 200 mL/hr over 30 Minutes Intravenous Every 8 hours 09/25/13 1926     09/25/13 1100  levofloxacin (LEVAQUIN) tablet 750 mg     750 mg Oral Daily 09/25/13 0956     09/24/13 1600  levofloxacin (LEVAQUIN) IVPB 750 mg  Status:  Discontinued     750 mg 100 mL/hr over 90 Minutes Intravenous Every 24 hours  09/23/13 1935 09/25/13 0956   09/24/13 0200  vancomycin (VANCOCIN) IVPB 750 mg/150 ml premix  Status:  Discontinued     750 mg 150 mL/hr over 60 Minutes Intravenous Every 8 hours 09/23/13 1907 09/25/13 0948   09/23/13 2000  clindamycin (CLEOCIN) IVPB 600 mg  Status:  Discontinued     600 mg 100 mL/hr over 30 Minutes Intravenous 3 times per day 09/23/13 1935 09/25/13 0947   09/23/13 1915  vancomycin (VANCOCIN) IVPB 750 mg/150 ml premix     750 mg 150 mL/hr over 60 Minutes Intravenous  Once 09/23/13 1907 09/23/13 2054   09/23/13 1730  clindamycin (CLEOCIN) IVPB 600 mg  Status:  Discontinued     600 mg 100 mL/hr over 30 Minutes Intravenous  Once 09/23/13 1712 09/23/13 1850   09/23/13 1715  levofloxacin (LEVAQUIN) IVPB 750 mg     750 mg 100 mL/hr over 90 Minutes Intravenous  Once 09/23/13 1712 09/23/13 1905     Antibiotics Given (last 72 hours)   Date/Time Action Medication Dose Rate   09/23/13 1954 Given   vancomycin (VANCOCIN) IVPB 750 mg/150 ml premix 750 mg 150 mL/hr   09/23/13 2100 Given   clindamycin (CLEOCIN) IVPB 600 mg 600 mg 100 mL/hr   09/24/13 0236 Given   vancomycin (VANCOCIN) IVPB 750 mg/150 ml premix 750 mg 150 mL/hr   09/24/13 0558 Given   clindamycin (CLEOCIN) IVPB 600 mg 600 mg 100 mL/hr   09/24/13 1244 Given   vancomycin (  VANCOCIN) IVPB 750 mg/150 ml premix 750 mg 150 mL/hr   09/24/13 1541 Given   clindamycin (CLEOCIN) IVPB 600 mg 600 mg 100 mL/hr   09/24/13 1635 Given   levofloxacin (LEVAQUIN) IVPB 750 mg 750 mg 100 mL/hr   09/24/13 2017 Given   vancomycin (VANCOCIN) IVPB 750 mg/150 ml premix 750 mg 150 mL/hr   09/24/13 2253 Given   clindamycin (CLEOCIN) IVPB 600 mg 600 mg 100 mL/hr   09/25/13 0408 Given   vancomycin (VANCOCIN) IVPB 750 mg/150 ml premix 750 mg 150 mL/hr   09/25/13 0554 Given   clindamycin (CLEOCIN) IVPB 600 mg 600 mg 100 mL/hr   09/25/13 1124 Given   levofloxacin (LEVAQUIN) tablet 750 mg 750 mg    09/25/13 2018 Given   meropenem (MERREM) 1  g in sodium chloride 0.9 % 100 mL IVPB 1 g 200 mL/hr   09/25/13 2130 Given   vancomycin (VANCOCIN) IVPB 750 mg/150 ml premix 750 mg 150 mL/hr   09/26/13 0357 Given   meropenem (MERREM) 1 g in sodium chloride 0.9 % 100 mL IVPB 1 g 200 mL/hr   09/26/13 0555 Given   vancomycin (VANCOCIN) IVPB 750 mg/150 ml premix 750 mg 150 mL/hr   09/26/13 1001 Given   levofloxacin (LEVAQUIN) tablet 750 mg 750 mg       HPI/Subjective: - on bipap this morning, alert, breathing comfortable.   Objective: Filed Vitals:   09/26/13 0819 09/26/13 0900 09/26/13 1000 09/26/13 1100  BP:  105/71 105/69 98/67  Pulse: 96 96 95 93  Temp:      TempSrc:      Resp: 19 17 19 21   Height:      Weight:      SpO2: 91% 100% 100% 99%    Intake/Output Summary (Last 24 hours) at 09/26/13 1200 Last data filed at 09/26/13 0800  Gross per 24 hour  Intake 3521.25 ml  Output   1075 ml  Net 2446.25 ml   Filed Weights   09/23/13 1849 09/25/13 1925  Weight: 58.968 kg (130 lb) 58.8 kg (129 lb 10.1 oz)   Exam:  General:  NAD  Cardiovascular: regular rate and rhythm, without MRG  Respiratory: coarse breath sounds throughout  Abdomen: soft, not tender to palpation, positive bowel sounds  MSK: no peripheral edema  Neuro: non focal  Data Reviewed: Basic Metabolic Panel:  Recent Labs Lab 09/23/13 1522 09/23/13 2143 09/24/13 0349 09/25/13 0520 09/25/13 2017  NA 126* 126* 128* 129* 130*  K 4.6 4.9 4.3 3.6 4.2  CL 93* 95* 98 99 99  CO2 23 23 23 22 20   GLUCOSE 367* 564* 209* 83 196*  BUN 12 12 10 9 13   CREATININE 1.11 1.03 1.00 1.00 1.08  CALCIUM 9.0 8.7 8.7 8.3* 8.3*   Liver Function Tests:  Recent Labs Lab 09/22/13 0652 09/23/13 1522 09/25/13 2017  AST 12 9 40*  ALT 5 <5 13  ALKPHOS 143* 149* 153*  BILITOT 0.4 0.4 0.3  PROT 8.1 7.1 6.4  ALBUMIN 2.6* 2.2* 1.7*    Recent Labs Lab 09/22/13 0652  LIPASE 11   CBC:  Recent Labs Lab 09/22/13 0652 09/23/13 1522 09/24/13 0349  09/25/13 0520 09/25/13 2017 09/26/13 0356  WBC 32.2* 29.6* 27.2* 28.9* 31.9* 32.6*  NEUTROABS 27.0* 23.6*  --   --   --   --   HGB 11.5* 9.5* 8.6* 8.6* 9.4* 10.1*  HCT 34.9* 28.5* 26.1* 26.0* 27.5* 29.8*  MCV 79.9 78.3 78.4 78.1 77.9* 77.2*  PLT  622* 625* 591* 561* 544* 664*   CBG:  Recent Labs Lab 09/25/13 1650 09/25/13 1818 09/25/13 2002 09/25/13 2128 09/26/13 0758  GLUCAP 189* 190* 194* 189* 162*    Recent Results (from the past 240 hour(s))  CULTURE, EXPECTORATED SPUTUM-ASSESSMENT     Status: None   Collection Time    09/24/13  6:02 AM      Result Value Range Status   Specimen Description SPUTUM   Final   Special Requests Normal   Final   Sputum evaluation     Final   Value: THIS SPECIMEN IS ACCEPTABLE. RESPIRATORY CULTURE REPORT TO FOLLOW.   Report Status 09/24/2013 FINAL   Final  CULTURE, RESPIRATORY (NON-EXPECTORATED)     Status: None   Collection Time    09/24/13  6:02 AM      Result Value Range Status   Specimen Description SPUTUM   Final   Special Requests NONE   Final   Gram Stain     Final   Value: FEW WBC PRESENT, PREDOMINANTLY PMN     RARE SQUAMOUS EPITHELIAL CELLS PRESENT     FEW YEAST     FEW GRAM POSITIVE RODS     Performed at [facility]   Culture     Final   Value: MODERATE CANDIDA ALBICANS     Performed at [facility]   Report Status PENDING   Incomplete  MRSA PCR SCREENING     Status: None   Collection Time    09/24/13  7:09 AM      Result Value Range Status   MRSA by PCR NEGATIVE  NEGATIVE Final   Comment:            The GeneXpert MRSA Assay (FDA     approved for NASAL specimens     only), is one component of a     comprehensive MRSA colonization     surveillance program. It is not     intended to diagnose MRSA     infection nor to guide or     monitor treatment for     MRSA infections.  MRSA PCR SCREENING     Status: None   Collection Time    09/25/13  7:51 PM      Result Value Range Status   MRSA by PCR  NEGATIVE  NEGATIVE Final   Comment:            The GeneXpert MRSA Assay (FDA     approved for NASAL specimens     only), is one component of a     comprehensive MRSA colonization     surveillance program. It is not     intended to diagnose MRSA     infection nor to guide or     monitor treatment for     MRSA infections.     Studies: Dg Chest Port 1 View  09/26/2013   CLINICAL DATA:  Shortness of breath.  A RDS.  EXAM: PORTABLE CHEST - 1 VIEW  COMPARISON:  09/25/2013  FINDINGS: Severe diffuse bilateral airspace disease again noted, unchanged. Heart is borderline in size. No significant visible effusions.  IMPRESSION: Stable diffuse bilateral airspace disease.   Electronically Signed   By: Charlett Nose M.D.   On: 09/26/2013 07:28   Dg Chest Port 1 View  09/25/2013   CLINICAL DATA:  Acute respiratory distress  EXAM: PORTABLE CHEST - 1 VIEW  COMPARISON:  06/30/2013  FINDINGS: Moderate diffuse airspace opacities throughout both lungs,  left worse than right, new since previous exam. Can't exclude layering pleural effusions. Heart size within normal limits for technique. Old healed right clavicle fracture. Subacute left clavicle fracture as previously demonstrated.  IMPRESSION: 1. New bilateral diffuse airspace disease may represent edema versus pneumonia.   Electronically Signed   By: Oley Balm M.D.   On: 09/25/2013 19:26    Scheduled Meds: . enoxaparin (LOVENOX) injection  40 mg Subcutaneous Q24H  . feeding supplement (GLUCERNA SHAKE)  237 mL Oral BID BM & HS  . furosemide  40 mg Intravenous Once  . insulin aspart  0-15 Units Subcutaneous TID WC  . insulin aspart  0-5 Units Subcutaneous QHS  . insulin glargine  10 Units Subcutaneous QHS  . levofloxacin  750 mg Oral Daily  . meropenem (MERREM) IV  1 g Intravenous Q8H  . metoCLOPramide (REGLAN) injection  5 mg Intravenous TID AC  . pantoprazole (PROTONIX) IV  40 mg Intravenous QHS  . pregabalin  100 mg Oral TID  . silver  sulfADIAZINE   Topical Daily  . vancomycin  750 mg Intravenous Q8H   Continuous Infusions: . sodium chloride 125 mL/hr at 09/26/13 5784    Active Problems:   Type 1 diabetes, uncontrolled, with neuropathy   Nausea and vomiting   Noncompliance with diet and medication regimen   Abdominal  pain, other specified site   Aspiration pneumonia   Gastroparesis   Protein-calorie malnutrition, severe   Acute respiratory failure with hypoxia  Time spent: 4  Pamella Pert, MD [facility] Pager [phone]. If 7 PM - 7 AM, please contact night-coverage at [email or web address], password [facility] 09/26/2013, 12:00 PM  LOS: 3 days

## 2013-09-26 NOTE — Plan of Care (Signed)
Problem: Phase II Progression Outcomes Goal: Wean O2 if indicated Outcome: Not Progressing Unable to wean O2, escalated care to BiPAP overnight.  Goal: Pain controlled Outcome: Not Progressing Significant leg pain. Pt spoke with Dr. Kem Kays regarding this pain and agreed that a goal of 4/10 would be deemed acceptable pain control. Pt is aware of the need to balance pain management with ability to remain alert and able to protect his airway. Concern for lethargy when pt was initially admitted to the ICU/SD unit.

## 2013-09-26 NOTE — Progress Notes (Signed)
*  Preliminary Results* Bilateral lower extremity venous duplex completed. Bilateral lower extremities are negative for deep vein thrombosis. There is no evidence of Baker's cyst bilaterally.  Preliminary results discussed with Dorothyann Gibbs, RN.  09/26/2013  Gertie Fey, RVT, RDCS, RDMS

## 2013-09-26 NOTE — Significant Event (Signed)
Pt with borderline oxygen saturation on NRB mask.  Will try on BiPAP, and f/u ABG >> if no improvement may need intubation and mechanical ventilation.  Coralyn Helling, MD 09/26/2013, 2:53 AM

## 2013-09-26 NOTE — Progress Notes (Signed)
02:45 Pt desaturating with non rebreather mask in place, sats of 82-88%. Mask removed so pt could cough and attempt to expel secretions. Mask replaced within 45 seconds. Pt desaturated to 59% and slowly increased to 83% - 88% with dips to the high 70s. RT called to bedside to assess and eLink called. Dr. Craige Cotta requested BiPAP be initiated.   BiPAP placed on pt at 0300. Pt instantly improved- oxygen saturations increased to 96% and are currently 98 to 100%. RT will draw ABG this am.   Dr. Kem Kays called and updated on patient's status. Will keep MD updated.   Please refer to charting for further assessment.
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
## 2013-09-26 NOTE — Progress Notes (Signed)
Echocardiogram 2D Echocardiogram has been performed.  Estelle Grumbles 09/26/2013, 9:49 AM

## 2013-09-26 NOTE — Significant Event (Signed)
Pt with episodes of hypoglycemia.  He is NPO, but getting D5 in IV fluid.  He has scheduled lantus.  Will d/c lantus while NPO and change SSI with CBG checks to q4h while NPO.  Also c/o chronic pain, and not able to take his usual meds from home.  Dilaudid helps, but he needs more frequent dosing.  Will change to q2h prn.  Coralyn Helling, MD 09/26/2013, 11:25 PM

## 2013-09-27 ENCOUNTER — Inpatient Hospital Stay (HOSPITAL_COMMUNITY): Payer: Medicaid Other

## 2013-09-27 DIAGNOSIS — N179 Acute kidney failure, unspecified: Secondary | ICD-10-CM

## 2013-09-27 DIAGNOSIS — J9600 Acute respiratory failure, unspecified whether with hypoxia or hypercapnia: Secondary | ICD-10-CM

## 2013-09-27 LAB — CBC WITH DIFFERENTIAL/PLATELET
Basophils Relative: 0 % (ref 0–1)
Eosinophils Absolute: 0.4 10*3/uL (ref 0.0–0.7)
HCT: 23.8 % — ABNORMAL LOW (ref 39.0–52.0)
Hemoglobin: 8 g/dL — ABNORMAL LOW (ref 13.0–17.0)
Lymphocytes Relative: 10 % — ABNORMAL LOW (ref 12–46)
MCH: 25.6 pg — ABNORMAL LOW (ref 26.0–34.0)
MCHC: 33.6 g/dL (ref 30.0–36.0)
Monocytes Relative: 4 % (ref 3–12)
Neutro Abs: 21.2 10*3/uL — ABNORMAL HIGH (ref 1.7–7.7)
Neutrophils Relative %: 85 % — ABNORMAL HIGH (ref 43–77)
RBC: 3.12 MIL/uL — ABNORMAL LOW (ref 4.22–5.81)
WBC: 25.1 10*3/uL — ABNORMAL HIGH (ref 4.0–10.5)

## 2013-09-27 LAB — RESPIRATORY VIRUS PANEL
Adenovirus: NOT DETECTED
Influenza A: NOT DETECTED
Metapneumovirus: NOT DETECTED
Parainfluenza 1: NOT DETECTED
Parainfluenza 2: NOT DETECTED
Parainfluenza 3: NOT DETECTED
Respiratory Syncytial Virus B: NOT DETECTED
Rhinovirus: DETECTED — AB

## 2013-09-27 LAB — LEGIONELLA ANTIGEN, URINE

## 2013-09-27 LAB — BLOOD GAS, ARTERIAL
Acid-base deficit: 1.1 mmol/L (ref 0.0–2.0)
Bicarbonate: 22.3 mEq/L (ref 20.0–24.0)
Delivery systems: POSITIVE
Drawn by: 317871
O2 Saturation: 94.5 %
Patient temperature: 99.3
TCO2: 20.6 mmol/L (ref 0–100)
pCO2 arterial: 35.2 mmHg (ref 35.0–45.0)
pO2, Arterial: 76.9 mmHg — ABNORMAL LOW (ref 80.0–100.0)

## 2013-09-27 LAB — PHOSPHORUS: Phosphorus: 3.9 mg/dL (ref 2.3–4.6)

## 2013-09-27 LAB — GLUCOSE, CAPILLARY
Glucose-Capillary: 155 mg/dL — ABNORMAL HIGH (ref 70–99)
Glucose-Capillary: 160 mg/dL — ABNORMAL HIGH (ref 70–99)
Glucose-Capillary: 184 mg/dL — ABNORMAL HIGH (ref 70–99)
Glucose-Capillary: 40 mg/dL — CL (ref 70–99)
Glucose-Capillary: 78 mg/dL (ref 70–99)

## 2013-09-27 LAB — BASIC METABOLIC PANEL
BUN: 11 mg/dL (ref 6–23)
Chloride: 104 mEq/L (ref 96–112)
GFR calc Af Amer: >90 mL/min (ref >90)
Potassium: 3.7 mEq/L (ref 3.5–5.1)
Sodium: 135 mEq/L (ref 135–145)

## 2013-09-27 LAB — MAGNESIUM: Magnesium: 1.3 mg/dL — ABNORMAL LOW (ref 1.5–2.5)

## 2013-09-27 MED ORDER — POTASSIUM CHLORIDE 20 MEQ/15ML (10%) PO LIQD: 40.0000 meq | Freq: Two times a day (BID) | ORAL | Status: DC

## 2013-09-27 MED ORDER — POTASSIUM CHLORIDE 20 MEQ/15ML (10%) PO LIQD
40.0000 meq | Freq: Two times a day (BID) | ORAL | Status: DC
  Administered 2013-09-27 – 2013-09-29 (×6): 40 meq via ORAL
  Filled 2013-09-27 (×8): qty 30

## 2013-09-27 MED ORDER — DEXTROSE-NACL 5-0.45 % IV SOLN
INTRAVENOUS | Status: DC
  Administered 2013-09-27: 50 mL/h via INTRAVENOUS
  Administered 2013-09-28: 12:00:00 via INTRAVENOUS
  Administered 2013-09-30: 1000 mL via INTRAVENOUS

## 2013-09-27 MED ORDER — DEXTROSE 50 % IV SOLN: 50.0000 mL | Freq: Once | INTRAVENOUS | Status: AC | PRN

## 2013-09-27 MED ORDER — DEXTROSE 10 % IV SOLN
INTRAVENOUS | Status: DC
  Administered 2013-09-27: 01:00:00 via INTRAVENOUS

## 2013-09-27 MED ORDER — MAGNESIUM SULFATE 40 MG/ML IJ SOLN
4.0000 g | Freq: Once | INTRAMUSCULAR | Status: AC
  Administered 2013-09-27: 4 g via INTRAVENOUS
  Filled 2013-09-27: qty 100

## 2013-09-27 MED ORDER — FUROSEMIDE 10 MG/ML IJ SOLN
40.0000 mg | Freq: Two times a day (BID) | INTRAMUSCULAR | Status: DC
  Administered 2013-09-27 – 2013-09-29 (×6): 40 mg via INTRAVENOUS
  Filled 2013-09-27 (×9): qty 4

## 2013-09-27 NOTE — Significant Event (Signed)
CBG (last 3)   Recent Labs  09/26/13 2216 09/26/13 2256 09/27/13 0038  GLUCAP 51* 95 78    Will change IV fluid to D10 for now until lantus clears out of his system.  Coralyn Helling, MD 09/27/2013, 1:06 AM

## 2013-09-27 NOTE — Progress Notes (Signed)
Pt had hypoglycemic events times 2 last night.  CBG 51; 1/2 amp D50 given per protocol and CBG at 0200  - 40 with CGB and another amp D50 given.  Elink aware with orders received.

## 2013-09-27 NOTE — Progress Notes (Signed)
PROGRESS NOTE  Jason [name] AVW:[id number] DOB: 1986-07-22 DOA: 09/23/2013 PCP: Nelwyn Salisbury, MD  Assessment/Plan: Aspiration pneumonia - suspect from recurrent vomiting and gastroparesis, and severe coughing. Rhinovirus positive. Continues to require BiPAP. CXR without much changes. - CCM consulted, appreciate input. No need for intubation currently but low threshold if his respiratory status does not improve. Narrow Abx today.  - ABG stable this morning Hypoxic respiratory failure - due to #1. BiPAP dependent this mornign Severe gastroparesis Anemia - Hb stable DM - restarting diet this morning, decrease D10. Very brittle, monitor today and start Lantus tonight if he eats well.  Abdominal pain -acute on chronic - avoid for now long acting pain medications given respiratory status, change to low dose short acting.  Hyponatremia - resolved  Diet: carb Fluids: D10 KVO DVT Prophylaxis: Lovenox  Code Status: Full Family Communication: none  Disposition Plan: inpatient  Consultants:  PCCM  Procedures:  none   Antibiotics  Anti-infectives   Start     Dose/Rate Route Frequency Ordered Stop   09/25/13 2200  vancomycin (VANCOCIN) IVPB 750 mg/150 ml premix     750 mg 150 mL/hr over 60 Minutes Intravenous 3 times per day 09/25/13 1928     09/25/13 2000  meropenem (MERREM) 1 g in sodium chloride 0.9 % 100 mL IVPB     1 g 200 mL/hr over 30 Minutes Intravenous Every 8 hours 09/25/13 1926     09/25/13 1100  levofloxacin (LEVAQUIN) tablet 750 mg     750 mg Oral Daily 09/25/13 0956     09/24/13 1600  levofloxacin (LEVAQUIN) IVPB 750 mg  Status:  Discontinued     750 mg 100 mL/hr over 90 Minutes Intravenous Every 24 hours 09/23/13 1935 09/25/13 0956   09/24/13 0200  vancomycin (VANCOCIN) IVPB 750 mg/150 ml premix  Status:  Discontinued     750 mg 150 mL/hr over 60 Minutes Intravenous Every 8 hours 09/23/13 1907 09/25/13 0948   09/23/13 2000  clindamycin (CLEOCIN) IVPB 600 mg   Status:  Discontinued     600 mg 100 mL/hr over 30 Minutes Intravenous 3 times per day 09/23/13 1935 09/25/13 0947   09/23/13 1915  vancomycin (VANCOCIN) IVPB 750 mg/150 ml premix     750 mg 150 mL/hr over 60 Minutes Intravenous  Once 09/23/13 1907 09/23/13 2054   09/23/13 1730  clindamycin (CLEOCIN) IVPB 600 mg  Status:  Discontinued     600 mg 100 mL/hr over 30 Minutes Intravenous  Once 09/23/13 1712 09/23/13 1850   09/23/13 1715  levofloxacin (LEVAQUIN) IVPB 750 mg     750 mg 100 mL/hr over 90 Minutes Intravenous  Once 09/23/13 1712 09/23/13 1905     Antibiotics Given (last 72 hours)   Date/Time Action Medication Dose Rate   09/24/13 1244 Given   vancomycin (VANCOCIN) IVPB 750 mg/150 ml premix 750 mg 150 mL/hr   09/24/13 1541 Given   clindamycin (CLEOCIN) IVPB 600 mg 600 mg 100 mL/hr   09/24/13 1635 Given   levofloxacin (LEVAQUIN) IVPB 750 mg 750 mg 100 mL/hr   09/24/13 2017 Given   vancomycin (VANCOCIN) IVPB 750 mg/150 ml premix 750 mg 150 mL/hr   09/24/13 2253 Given   clindamycin (CLEOCIN) IVPB 600 mg 600 mg 100 mL/hr   09/25/13 0408 Given   vancomycin (VANCOCIN) IVPB 750 mg/150 ml premix 750 mg 150 mL/hr   09/25/13 0554 Given   clindamycin (CLEOCIN) IVPB 600 mg 600 mg 100 mL/hr   09/25/13 1124 Given  levofloxacin (LEVAQUIN) tablet 750 mg 750 mg    09/25/13 2018 Given   meropenem (MERREM) 1 g in sodium chloride 0.9 % 100 mL IVPB 1 g 200 mL/hr   09/25/13 2130 Given   vancomycin (VANCOCIN) IVPB 750 mg/150 ml premix 750 mg 150 mL/hr   09/26/13 0357 Given   meropenem (MERREM) 1 g in sodium chloride 0.9 % 100 mL IVPB 1 g 200 mL/hr   09/26/13 0555 Given   vancomycin (VANCOCIN) IVPB 750 mg/150 ml premix 750 mg 150 mL/hr   09/26/13 1001 Given   levofloxacin (LEVAQUIN) tablet 750 mg 750 mg    09/26/13 1306 Given   meropenem (MERREM) 1 g in sodium chloride 0.9 % 100 mL IVPB 1 g 200 mL/hr   09/26/13 1340 Given   vancomycin (VANCOCIN) IVPB 750 mg/150 ml premix 750 mg 150 mL/hr    09/26/13 2100 Given   meropenem (MERREM) 1 g in sodium chloride 0.9 % 100 mL IVPB 1 g 200 mL/hr   09/26/13 2203 Given   vancomycin (VANCOCIN) IVPB 750 mg/150 ml premix 750 mg 150 mL/hr   09/27/13 0322 Given   meropenem (MERREM) 1 g in sodium chloride 0.9 % 100 mL IVPB 1 g 200 mL/hr   09/27/13 0605 Given   vancomycin (VANCOCIN) IVPB 750 mg/150 ml premix 750 mg 150 mL/hr      HPI/Subjective: - on bipap this morning, alert, breathing comfortable. Wants to eat.   Objective: Filed Vitals:   09/27/13 0300 09/27/13 0335 09/27/13 0400 09/27/13 0503  BP: 114/67  101/65 101/65  Pulse: 103  101 104  Temp:  99.3 F (37.4 C)    TempSrc:  Axillary    Resp: 23  19   Height:      Weight:      SpO2: 99%  100% 98%    Intake/Output Summary (Last 24 hours) at 09/27/13 0737 Last data filed at 09/27/13 0503  Gross per 24 hour  Intake   1325 ml  Output   2950 ml  Net  -1625 ml   Filed Weights   09/23/13 1849 09/25/13 1925  Weight: 58.968 kg (130 lb) 58.8 kg (129 lb 10.1 oz)   Exam:  General:  NAD, sleeping on BiPAP  Cardiovascular: regular rate and rhythm, without MRG  Respiratory: no wheezing, sounding better  Abdomen: soft, not tender to palpation, positive bowel sounds  MSK: no peripheral edema  Neuro: non focal  Data Reviewed: Basic Metabolic Panel:  Recent Labs Lab 09/23/13 2143 09/24/13 0349 09/25/13 0520 09/25/13 2017 09/27/13 0515  NA 126* 128* 129* 130* 135  K 4.9 4.3 3.6 4.2 3.7  CL 95* 98 99 99 104  CO2 23 23 22 20 22   GLUCOSE 564* 209* 83 196* 161*  BUN 12 10 9 13 11   CREATININE 1.03 1.00 1.00 1.08 1.08  CALCIUM 8.7 8.7 8.3* 8.3* 8.3*  MG  --   --   --   --  1.3*  PHOS  --   --   --   --  3.9   Liver Function Tests:  Recent Labs Lab 09/22/13 0652 09/23/13 1522 09/25/13 2017  AST 12 9 40*  ALT 5 <5 13  ALKPHOS 143* 149* 153*  BILITOT 0.4 0.4 0.3  PROT 8.1 7.1 6.4  ALBUMIN 2.6* 2.2* 1.7*    Recent Labs Lab 09/22/13 0652  LIPASE 11    CBC:  Recent Labs Lab 09/22/13 8119 09/23/13 1522 09/24/13 0349 09/25/13 0520 09/25/13 2017 09/26/13 0356 09/27/13 0515  WBC 32.2* 29.6* 27.2* 28.9* 31.9* 32.6* 25.1*  NEUTROABS 27.0* 23.6*  --   --   --   --  21.2*  HGB 11.5* 9.5* 8.6* 8.6* 9.4* 10.1* 8.0*  HCT 34.9* 28.5* 26.1* 26.0* 27.5* 29.8* 23.8*  MCV 79.9 78.3 78.4 78.1 77.9* 77.2* 76.3*  PLT 622* 625* 591* 561* 544* 664* 224   CBG:  Recent Labs Lab 09/26/13 2256 09/27/13 0038 09/27/13 0103 09/27/13 0148 09/27/13 0448  GLUCAP 95 78 40* 160* 155*    Recent Results (from the past 240 hour(s))  CULTURE, EXPECTORATED SPUTUM-ASSESSMENT     Status: None   Collection Time    09/24/13  6:02 AM      Result Value Range Status   Specimen Description SPUTUM   Final   Special Requests Normal   Final   Sputum evaluation     Final   Value: THIS SPECIMEN IS ACCEPTABLE. RESPIRATORY CULTURE REPORT TO FOLLOW.   Report Status 09/24/2013 FINAL   Final  CULTURE, RESPIRATORY (NON-EXPECTORATED)     Status: None   Collection Time    09/24/13  6:02 AM      Result Value Range Status   Specimen Description SPUTUM   Final   Special Requests NONE   Final   Gram Stain     Final   Value: FEW WBC PRESENT, PREDOMINANTLY PMN     RARE SQUAMOUS EPITHELIAL CELLS PRESENT     FEW YEAST     FEW GRAM POSITIVE RODS     Performed at [facility]   Culture     Final   Value: MODERATE CANDIDA ALBICANS     Performed at [facility]   Report Status 09/26/2013 FINAL   Final  MRSA PCR SCREENING     Status: None   Collection Time    09/24/13  7:09 AM      Result Value Range Status   MRSA by PCR NEGATIVE  NEGATIVE Final   Comment:            The GeneXpert MRSA Assay (FDA     approved for NASAL specimens     only), is one component of a     comprehensive MRSA colonization     surveillance program. It is not     intended to diagnose MRSA     infection nor to guide or     monitor treatment for     MRSA infections.  MRSA  PCR SCREENING     Status: None   Collection Time    09/25/13  7:51 PM      Result Value Range Status   MRSA by PCR NEGATIVE  NEGATIVE Final   Comment:            The GeneXpert MRSA Assay (FDA     approved for NASAL specimens     only), is one component of a     comprehensive MRSA colonization     surveillance program. It is not     intended to diagnose MRSA     infection nor to guide or     monitor treatment for     MRSA infections.  RESPIRATORY VIRUS PANEL     Status: Abnormal   Collection Time    09/26/13 10:06 AM      Result Value Range Status   Source - RVPAN NOSE   Final   Respiratory Syncytial Virus A NOT DETECTED   Final   Respiratory Syncytial Virus B NOT DETECTED  Final   Influenza A NOT DETECTED   Final   Influenza B NOT DETECTED   Final   Parainfluenza 1 NOT DETECTED   Final   Parainfluenza 2 NOT DETECTED   Final   Parainfluenza 3 NOT DETECTED   Final   Metapneumovirus NOT DETECTED   Final   Rhinovirus DETECTED (*)  Final   Adenovirus NOT DETECTED   Final   Influenza A H1 NOT DETECTED   Final   Influenza A H3 NOT DETECTED   Final   Comment: (NOTE)           Normal Reference Range for each Analyte: NOT DETECTED     Testing performed using the Luminex xTAG Respiratory Viral Panel test     kit.     This test was developed and its performance characteristics determined     by [facility]. It has not been cleared or approved by the [name]     Food and Drug Administration. This test is used for clinical purposes.     It should not be regarded as investigational or for research. This     laboratory is certified under the Clinical Laboratory Improvement     Amendments of 1988 (CLIA) as qualified to perform high complexity     clinical laboratory testing.     Performed at [facility]     Studies: Dg Chest Port 1 View  09/26/2013   CLINICAL DATA:  Shortness of breath.  A RDS.  EXAM: PORTABLE CHEST - 1 VIEW  COMPARISON:  09/25/2013  FINDINGS: Severe  diffuse bilateral airspace disease again noted, unchanged. Heart is borderline in size. No significant visible effusions.  IMPRESSION: Stable diffuse bilateral airspace disease.   Electronically Signed   By: Charlett Nose M.D.   On: 09/26/2013 07:28   Dg Chest Port 1 View  09/25/2013   CLINICAL DATA:  Acute respiratory distress  EXAM: PORTABLE CHEST - 1 VIEW  COMPARISON:  06/30/2013  FINDINGS: Moderate diffuse airspace opacities throughout both lungs, left worse than right, new since previous exam. Can't exclude layering pleural effusions. Heart size within normal limits for technique. Old healed right clavicle fracture. Subacute left clavicle fracture as previously demonstrated.  IMPRESSION: 1. New bilateral diffuse airspace disease may represent edema versus pneumonia.   Electronically Signed   By: Oley Balm M.D.   On: 09/25/2013 19:26    Scheduled Meds: . chlorhexidine  15 mL Mouth Rinse BID  . enoxaparin (LOVENOX) injection  40 mg Subcutaneous Q24H  . feeding supplement (GLUCERNA SHAKE)  237 mL Oral BID BM & HS  . insulin aspart  0-15 Units Subcutaneous Q4H  . levofloxacin  750 mg Oral Daily  . meropenem (MERREM) IV  1 g Intravenous Q8H  . metoCLOPramide (REGLAN) injection  5 mg Intravenous TID AC  . pantoprazole (PROTONIX) IV  40 mg Intravenous QHS  . pregabalin  100 mg Oral TID  . silver sulfADIAZINE   Topical Daily  . sodium chloride  10-40 mL Intracatheter Q12H  . vancomycin  750 mg Intravenous Q8H   Continuous Infusions: . dextrose 40 mL/hr at 09/27/13 0108    Active Problems:   Type 1 diabetes, uncontrolled, with neuropathy   Nausea and vomiting   Noncompliance with diet and medication regimen   Abdominal  pain, other specified site   Aspiration pneumonia   Gastroparesis   Protein-calorie malnutrition, severe   Acute respiratory failure with hypoxia  Time spent: 25  Pamella Pert, MD [facility]  Pager [phone]. If 7 PM - 7 AM, please contact  night-coverage at [email or web address], password [facility] 09/27/2013, 7:37 AM  LOS: 4 days
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
## 2013-09-28 ENCOUNTER — Inpatient Hospital Stay (HOSPITAL_COMMUNITY): Payer: Medicaid Other

## 2013-09-28 DIAGNOSIS — J80 Acute respiratory distress syndrome: Secondary | ICD-10-CM | POA: Diagnosis present

## 2013-09-28 DIAGNOSIS — J9589 Other postprocedural complications and disorders of respiratory system, not elsewhere classified: Secondary | ICD-10-CM

## 2013-09-28 DIAGNOSIS — J9600 Acute respiratory failure, unspecified whether with hypoxia or hypercapnia: Secondary | ICD-10-CM | POA: Diagnosis present

## 2013-09-28 LAB — BASIC METABOLIC PANEL
BUN: 13 mg/dL (ref 6–23)
CO2: 22 mEq/L (ref 19–32)
Chloride: 98 mEq/L (ref 96–112)
Creatinine, Ser: 1.15 mg/dL (ref 0.50–1.35)
GFR calc non Af Amer: 85 mL/min — ABNORMAL LOW (ref >90)
Potassium: 4.2 mEq/L (ref 3.5–5.1)

## 2013-09-28 LAB — PRO B NATRIURETIC PEPTIDE: Pro B Natriuretic peptide (BNP): 673 pg/mL — ABNORMAL HIGH (ref 0–125)

## 2013-09-28 LAB — BLOOD GAS, ARTERIAL
Acid-base deficit: 2.7 mmol/L — ABNORMAL HIGH (ref 0.0–2.0)
Bicarbonate: 20.9 mEq/L (ref 20.0–24.0)
FIO2: 100 %
O2 Saturation: 99.8 %
Patient temperature: 98.6
Pressure control: 38 cmH2O
TCO2: 19.8 mmol/L (ref 0–100)
pO2, Arterial: 396 mmHg — ABNORMAL HIGH (ref 80.0–100.0)

## 2013-09-28 LAB — GLUCOSE, CAPILLARY
Glucose-Capillary: 35 mg/dL — CL (ref 70–99)
Glucose-Capillary: 174 mg/dL — ABNORMAL HIGH (ref 70–99)
Glucose-Capillary: 184 mg/dL — ABNORMAL HIGH (ref 70–99)
Glucose-Capillary: 158 mg/dL — ABNORMAL HIGH (ref 70–99)

## 2013-09-28 LAB — CBC WITH DIFFERENTIAL/PLATELET
Basophils Absolute: 0 10*3/uL (ref 0.0–0.1)
Basophils Relative: 0 % (ref 0–1)
Eosinophils Absolute: 0.5 10*3/uL (ref 0.0–0.7)
Eosinophils Relative: 2 % (ref 0–5)
HCT: 25 % — ABNORMAL LOW (ref 39.0–52.0)
Hemoglobin: 8.4 g/dL — ABNORMAL LOW (ref 13.0–17.0)
Lymphocytes Relative: 10 % — ABNORMAL LOW (ref 12–46)
MCHC: 33.6 g/dL (ref 30.0–36.0)
Monocytes Relative: 5 % (ref 3–12)
Neutro Abs: 20 10*3/uL — ABNORMAL HIGH (ref 1.7–7.7)
Neutrophils Relative %: 83 % — ABNORMAL HIGH (ref 43–77)
RBC: 3.31 MIL/uL — ABNORMAL LOW (ref 4.22–5.81)
RDW: 14.8 % (ref 11.5–15.5)
WBC: 24.1 10*3/uL — ABNORMAL HIGH (ref 4.0–10.5)

## 2013-09-28 MED ORDER — FENTANYL CITRATE 0.05 MG/ML IJ SOLN
50.0000 ug/h | INTRAMUSCULAR | Status: DC
  Administered 2013-09-28 – 2013-09-30 (×3): 100 ug/h via INTRAVENOUS
  Filled 2013-09-28 (×3): qty 50

## 2013-09-28 MED ORDER — MIDAZOLAM HCL 2 MG/2ML IJ SOLN
2.0000 mg | Freq: Once | INTRAMUSCULAR | Status: DC
  Filled 2013-09-28: qty 2

## 2013-09-28 MED ORDER — MIDAZOLAM HCL 2 MG/2ML IJ SOLN
2.0000 mg | INTRAMUSCULAR | Status: DC | PRN
  Administered 2013-09-28 (×2): 2 mg via INTRAVENOUS
  Filled 2013-09-28: qty 2

## 2013-09-28 MED ORDER — SODIUM CHLORIDE 0.9 % IV SOLN
1.0000 mg/h | INTRAVENOUS | Status: DC
  Administered 2013-09-28 – 2013-10-01 (×3): 2 mg/h via INTRAVENOUS
  Filled 2013-09-28 (×4): qty 10

## 2013-09-28 MED ORDER — SUCCINYLCHOLINE CHLORIDE 20 MG/ML IJ SOLN
INTRAMUSCULAR | Status: AC
  Filled 2013-09-28: qty 1

## 2013-09-28 MED ORDER — IPRATROPIUM BROMIDE 0.02 % IN SOLN
RESPIRATORY_TRACT | Status: AC
  Administered 2013-09-28: 0.5 mg via RESPIRATORY_TRACT
  Filled 2013-09-28: qty 2.5

## 2013-09-28 MED ORDER — FENTANYL CITRATE 0.05 MG/ML IJ SOLN
INTRAMUSCULAR | Status: AC
  Administered 2013-09-28: 100 ug
  Filled 2013-09-28: qty 6

## 2013-09-28 MED ORDER — ALBUTEROL SULFATE (5 MG/ML) 0.5% IN NEBU
2.5000 mg | INHALATION_SOLUTION | Freq: Four times a day (QID) | RESPIRATORY_TRACT | Status: DC
  Administered 2013-09-28 – 2013-10-05 (×29): 2.5 mg via RESPIRATORY_TRACT
  Filled 2013-09-28 (×27): qty 0.5

## 2013-09-28 MED ORDER — ETOMIDATE 2 MG/ML IV SOLN
INTRAVENOUS | Status: AC
  Administered 2013-09-28: 20 mg
  Filled 2013-09-28: qty 20

## 2013-09-28 MED ORDER — MIDAZOLAM HCL 2 MG/2ML IJ SOLN
2.0000 mg | INTRAMUSCULAR | Status: DC | PRN
  Administered 2013-09-28: 2 mg via INTRAVENOUS
  Filled 2013-09-28: qty 2

## 2013-09-28 MED ORDER — MIDAZOLAM HCL 2 MG/2ML IJ SOLN
INTRAMUSCULAR | Status: AC
  Administered 2013-09-28: 2 mg
  Filled 2013-09-28: qty 6

## 2013-09-28 MED ORDER — ALBUTEROL SULFATE (5 MG/ML) 0.5% IN NEBU
INHALATION_SOLUTION | RESPIRATORY_TRACT | Status: AC
  Administered 2013-09-28: 2.5 mg via RESPIRATORY_TRACT
  Filled 2013-09-28: qty 0.5

## 2013-09-28 MED ORDER — IPRATROPIUM BROMIDE 0.02 % IN SOLN
0.5000 mg | Freq: Four times a day (QID) | RESPIRATORY_TRACT | Status: DC
  Administered 2013-09-28 – 2013-10-03 (×20): 0.5 mg via RESPIRATORY_TRACT
  Filled 2013-09-28 (×19): qty 2.5

## 2013-09-28 MED ORDER — LIDOCAINE HCL (CARDIAC) 20 MG/ML IV SOLN
INTRAVENOUS | Status: AC
  Filled 2013-09-28: qty 5

## 2013-09-28 MED ORDER — ROCURONIUM BROMIDE 50 MG/5ML IV SOLN
INTRAVENOUS | Status: AC
  Administered 2013-09-28: 50 mg
  Filled 2013-09-28: qty 2

## 2013-09-28 MED ORDER — FENTANYL CITRATE 0.05 MG/ML IJ SOLN
100.0000 ug | INTRAMUSCULAR | Status: DC | PRN
  Administered 2013-09-28 (×2): 100 ug via INTRAVENOUS
  Filled 2013-09-28: qty 2

## 2013-09-28 MED ORDER — ROCURONIUM BROMIDE 50 MG/5ML IV SOLN: 50.0000 mg | Freq: Once | INTRAVENOUS | Status: DC

## 2013-09-28 MED ORDER — ETOMIDATE 2 MG/ML IV SOLN: 20.0000 mg | Freq: Once | INTRAVENOUS | Status: DC

## 2013-09-28 MED ORDER — FENTANYL CITRATE 0.05 MG/ML IJ SOLN
100.0000 ug | Freq: Once | INTRAMUSCULAR | Status: DC
  Filled 2013-09-28: qty 2

## 2013-09-28 NOTE — Progress Notes (Signed)
PROGRESS NOTE  Jason [name] ZOX:[id number] DOB: January 27, 1986 DOA: 09/23/2013 PCP: Nelwyn Salisbury, MD  Assessment/Plan: Aspiration pneumonia - suspect from recurrent vomiting and gastroparesis, and severe coughing. Rhinovirus positive. Continues to require BiPAP 100% FIO2 and cannot tolerate even to have the mask off for eating. He now has been on BiPAP for 48 hours. Some desaturation recorded even with the mask on. CXR without much changes. May need intubation today, defer this to PCCM.  ARDS - due to #1 Hypoxic respiratory failure - due to #1. BiPAP dependent this mornign Severe gastroparesis Anemia - Hb stable DM - restarted diet 12/21 but patient cannot eat due to desaturation. NPO now, hold Lantus as he gets hypoglycemic quite rapidly.  Abdominal pain -acute on chronic Hyponatremia - stable  Diet: carb Fluids: D5 1/2 NS DVT Prophylaxis: Lovenox  Code Status: Full Family Communication: none  Disposition Plan: inpatient  Consultants:  PCCM  Procedures:  2D echo Study Conclusions - Study data: Technically adequate study. - Left ventricle: The cavity size was normal. Wall thickness was increased in a pattern of mild LVH. Systolic function was normal. The estimated ejection fraction was in the range of 50% to 55%. Wall motion was normal; there were no regional wall motion abnormalities. Left ventricular diastolic function parameters were normal. - Aortic valve: Valve area: 1.82cm^2 (Vmax). - Right ventricle: There is intermittent diastolic andsystolic flattening of the interventricular septum, presumed to occur with inspiration. In the absence of pericardial effusion or restricitive diastolic findings this is suggestive of elevated pulmonary artery pressures.The cavity size was mildly dilated. - Right atrium: The atrium was mildly dilated. - Pulmonary arteries: Elevated RA pressures indicated by dilated IVC and interventricular septum flattening are suggestive of pulmonary hypertension.  - Inferior vena cava: The vessel was dilated; the respirophasic diameter changes were blunted (< 50%); findings are consistent with elevated central venous pressure.   Antibiotics  Anti-infectives   Start     Dose/Rate Route Frequency Ordered Stop   09/25/13 2200  vancomycin (VANCOCIN) IVPB 750 mg/150 ml premix  Status:  Discontinued     750 mg 150 mL/hr over 60 Minutes Intravenous 3 times per day 09/25/13 1928 09/27/13 1029   09/25/13 2000  meropenem (MERREM) 1 g in sodium chloride 0.9 % 100 mL IVPB  Status:  Discontinued     1 g 200 mL/hr over 30 Minutes Intravenous Every 8 hours 09/25/13 1926 09/27/13 1029   09/25/13 1100  levofloxacin (LEVAQUIN) tablet 750 mg     750 mg Oral Daily 09/25/13 0956     09/24/13 1600  levofloxacin (LEVAQUIN) IVPB 750 mg  Status:  Discontinued     750 mg 100 mL/hr over 90 Minutes Intravenous Every 24 hours 09/23/13 1935 09/25/13 0956   09/24/13 0200  vancomycin (VANCOCIN) IVPB 750 mg/150 ml premix  Status:  Discontinued     750 mg 150 mL/hr over 60 Minutes Intravenous Every 8 hours 09/23/13 1907 09/25/13 0948   09/23/13 2000  clindamycin (CLEOCIN) IVPB 600 mg  Status:  Discontinued     600 mg 100 mL/hr over 30 Minutes Intravenous 3 times per day 09/23/13 1935 09/25/13 0947   09/23/13 1915  vancomycin (VANCOCIN) IVPB 750 mg/150 ml premix     750 mg 150 mL/hr over 60 Minutes Intravenous  Once 09/23/13 1907 09/23/13 2054   09/23/13 1730  clindamycin (CLEOCIN) IVPB 600 mg  Status:  Discontinued     600 mg 100 mL/hr over 30 Minutes Intravenous  Once 09/23/13 1712 09/23/13 1850  09/23/13 1715  levofloxacin (LEVAQUIN) IVPB 750 mg     750 mg 100 mL/hr over 90 Minutes Intravenous  Once 09/23/13 1712 09/23/13 1905     Antibiotics Given (last 72 hours)   Date/Time Action Medication Dose Rate   09/25/13 1124 Given   levofloxacin (LEVAQUIN) tablet 750 mg 750 mg    09/25/13 2018 Given   meropenem (MERREM) 1 g in sodium chloride 0.9 % 100 mL IVPB 1 g 200  mL/hr   09/25/13 2130 Given   vancomycin (VANCOCIN) IVPB 750 mg/150 ml premix 750 mg 150 mL/hr   09/26/13 0357 Given   meropenem (MERREM) 1 g in sodium chloride 0.9 % 100 mL IVPB 1 g 200 mL/hr   09/26/13 0555 Given   vancomycin (VANCOCIN) IVPB 750 mg/150 ml premix 750 mg 150 mL/hr   09/26/13 1001 Given   levofloxacin (LEVAQUIN) tablet 750 mg 750 mg    09/26/13 1306 Given   meropenem (MERREM) 1 g in sodium chloride 0.9 % 100 mL IVPB 1 g 200 mL/hr   09/26/13 1340 Given   vancomycin (VANCOCIN) IVPB 750 mg/150 ml premix 750 mg 150 mL/hr   09/26/13 2100 Given   meropenem (MERREM) 1 g in sodium chloride 0.9 % 100 mL IVPB 1 g 200 mL/hr   09/26/13 2203 Given   vancomycin (VANCOCIN) IVPB 750 mg/150 ml premix 750 mg 150 mL/hr   09/27/13 0322 Given   meropenem (MERREM) 1 g in sodium chloride 0.9 % 100 mL IVPB 1 g 200 mL/hr   09/27/13 0605 Given   vancomycin (VANCOCIN) IVPB 750 mg/150 ml premix 750 mg 150 mL/hr   09/27/13 1243 Given   levofloxacin (LEVAQUIN) tablet 750 mg 750 mg       HPI/Subjective: - on bipap, somewhat frustrated because he can't eat. Denies dyspnea.   Objective: Filed Vitals:   09/28/13 0800 09/28/13 0900 09/28/13 0903 09/28/13 0908  BP: 101/71   101/71  Pulse: 96 104 112   Temp: 98.5 F (36.9 C)     TempSrc: Axillary     Resp: 21 24 25    Height:      Weight:      SpO2: 100% 100% 75%     Intake/Output Summary (Last 24 hours) at 09/28/13 0944 Last data filed at 09/28/13 0900  Gross per 24 hour  Intake 1370.83 ml  Output   1675 ml  Net -304.17 ml   Filed Weights   09/23/13 1849 09/25/13 1925 09/28/13 0600  Weight: 58.968 kg (130 lb) 58.8 kg (129 lb 10.1 oz) 63.413 kg (139 lb 12.8 oz)   Exam:  General:  Does not appear to be in distress, on BiPAP  Cardiovascular: regular rate and rhythm, without MRG  Respiratory: no wheezing, coarse breath sounds  Abdomen: soft, not tender to palpation, positive bowel sounds  MSK: no peripheral edema  Neuro: non  focal  Data Reviewed: Basic Metabolic Panel:  Recent Labs Lab 09/24/13 0349 09/25/13 0520 09/25/13 2017 09/27/13 0515 09/28/13 0430  NA 128* 129* 130* 135 132*  K 4.3 3.6 4.2 3.7 4.2  CL 98 99 99 104 98  CO2 23 22 20 22 22   GLUCOSE 209* 83 196* 161* 203*  BUN 10 9 13 11 13   CREATININE 1.00 1.00 1.08 1.08 1.15  CALCIUM 8.7 8.3* 8.3* 8.3* 8.8  MG  --   --   --  1.3* 1.9  PHOS  --   --   --  3.9 3.5   Liver Function Tests:  Recent Labs Lab 09/22/13 0652 09/23/13 1522 09/25/13 2017  AST 12 9 40*  ALT 5 <5 13  ALKPHOS 143* 149* 153*  BILITOT 0.4 0.4 0.3  PROT 8.1 7.1 6.4  ALBUMIN 2.6* 2.2* 1.7*    Recent Labs Lab 09/22/13 0652  LIPASE 11   CBC:  Recent Labs Lab 09/22/13 0652 09/23/13 1522  09/25/13 0520 09/25/13 2017 09/26/13 0356 09/27/13 0515 09/28/13 0430  WBC 32.2* 29.6*  < > 28.9* 31.9* 32.6* 25.1* 24.1*  NEUTROABS 27.0* 23.6*  --   --   --   --  21.2* 20.0*  HGB 11.5* 9.5*  < > 8.6* 9.4* 10.1* 8.0* 8.4*  HCT 34.9* 28.5*  < > 26.0* 27.5* 29.8* 23.8* 25.0*  MCV 79.9 78.3  < > 78.1 77.9* 77.2* 76.3* 75.5*  PLT 622* 625*  < > 561* 544* 664* 224 163  < > = values in this interval not displayed. CBG:  Recent Labs Lab 09/27/13 1709 09/27/13 2012 09/27/13 2344 09/28/13 0312 09/28/13 0844  GLUCAP 184* 174* 243* 184* 185*    Recent Results (from the past 240 hour(s))  CULTURE, EXPECTORATED SPUTUM-ASSESSMENT     Status: None   Collection Time    09/24/13  6:02 AM      Result Value Range Status   Specimen Description SPUTUM   Final   Special Requests Normal   Final   Sputum evaluation     Final   Value: THIS SPECIMEN IS ACCEPTABLE. RESPIRATORY CULTURE REPORT TO FOLLOW.   Report Status 09/24/2013 FINAL   Final  CULTURE, RESPIRATORY (NON-EXPECTORATED)     Status: None   Collection Time    09/24/13  6:02 AM      Result Value Range Status   Specimen Description SPUTUM   Final   Special Requests NONE   Final   Gram Stain     Final   Value: FEW  WBC PRESENT, PREDOMINANTLY PMN     RARE SQUAMOUS EPITHELIAL CELLS PRESENT     FEW YEAST     FEW GRAM POSITIVE RODS     Performed at [facility]   Culture     Final   Value: MODERATE CANDIDA ALBICANS     Performed at [facility]   Report Status 09/26/2013 FINAL   Final  MRSA PCR SCREENING     Status: None   Collection Time    09/24/13  7:09 AM      Result Value Range Status   MRSA by PCR NEGATIVE  NEGATIVE Final   Comment:            The GeneXpert MRSA Assay (FDA     approved for NASAL specimens     only), is one component of a     comprehensive MRSA colonization     surveillance program. It is not     intended to diagnose MRSA     infection nor to guide or     monitor treatment for     MRSA infections.  MRSA PCR SCREENING     Status: None   Collection Time    09/25/13  7:51 PM      Result Value Range Status   MRSA by PCR NEGATIVE  NEGATIVE Final   Comment:            The GeneXpert MRSA Assay (FDA     approved for NASAL specimens     only), is one component of a     comprehensive MRSA  colonization     surveillance program. It is not     intended to diagnose MRSA     infection nor to guide or     monitor treatment for     MRSA infections.  CULTURE, BLOOD (ROUTINE X 2)     Status: None   Collection Time    09/25/13  7:55 PM      Result Value Range Status   Specimen Description BLOOD RIGHT HAND   Final   Special Requests BOTTLES DRAWN AEROBIC AND ANAEROBIC 10CC   Final   Culture  Setup Time     Final   Value: 09/26/2013 00:39     Performed at [facility]   Culture     Final   Value:        BLOOD CULTURE RECEIVED NO GROWTH TO DATE CULTURE WILL BE HELD FOR 5 DAYS BEFORE ISSUING A FINAL NEGATIVE REPORT     Performed at [facility]   Report Status PENDING   Incomplete  CULTURE, BLOOD (ROUTINE X 2)     Status: None   Collection Time    09/25/13  8:05 PM      Result Value Range Status   Specimen Description BLOOD RIGHT ARM   Final    Special Requests BOTTLES DRAWN AEROBIC AND ANAEROBIC 10CC   Final   Culture  Setup Time     Final   Value: 09/26/2013 00:39     Performed at [facility]   Culture     Final   Value:        BLOOD CULTURE RECEIVED NO GROWTH TO DATE CULTURE WILL BE HELD FOR 5 DAYS BEFORE ISSUING A FINAL NEGATIVE REPORT     Performed at [facility]   Report Status PENDING   Incomplete  RESPIRATORY VIRUS PANEL     Status: Abnormal   Collection Time    09/26/13 10:06 AM      Result Value Range Status   Source - RVPAN NOSE   Final   Respiratory Syncytial Virus A NOT DETECTED   Final   Respiratory Syncytial Virus B NOT DETECTED   Final   Influenza A NOT DETECTED   Final   Influenza B NOT DETECTED   Final   Parainfluenza 1 NOT DETECTED   Final   Parainfluenza 2 NOT DETECTED   Final   Parainfluenza 3 NOT DETECTED   Final   Metapneumovirus NOT DETECTED   Final   Rhinovirus DETECTED (*)  Final   Adenovirus NOT DETECTED   Final   Influenza A H1 NOT DETECTED   Final   Influenza A H3 NOT DETECTED   Final   Comment: (NOTE)           Normal Reference Range for each Analyte: NOT DETECTED     Testing performed using the Luminex xTAG Respiratory Viral Panel test     kit.     This test was developed and its performance characteristics determined     by [facility]. It has not been cleared or approved by the [name]     Food and Drug Administration. This test is used for clinical purposes.     It should not be regarded as investigational or for research. This     laboratory is certified under the Clinical Laboratory Improvement     Amendments of 1988 (CLIA) as qualified to perform high complexity     clinical laboratory testing.     Performed at  [facility]     Studies: Dg Chest B[place]ler 1 View  09/28/2013   CLINICAL DATA:  Respiratory failure.  EXAM: PORTABLE CHEST - 1 VIEW  COMPARISON:  09/27/2013 .  FINDINGS: PICC line in stable position . Persistent dense bilateral pulmonary  alveolar infiltrates are noted. There is no cardiomegaly or evidence of pulmonary venous congestion. This suggests the bilateral pulmonary infiltrate secondary to pneumonia, hemorrhage, or noncardiogenic pulmonary edema/ ARDS. No pneumothorax. Mild gastric distention.  IMPRESSION: 1. Persistent dense bilateral pulmonary infiltrates. No cardiomegaly or pulmonary venous congestion. Pulmonary infiltrates could be secondary to bilateral dense pneumonia, hemorrhage, non cardiogenic pulmonary edema/ARDS. 2. PICC line in stable position . 3. Mild gastric distention.   Electronically Signed   By: Maisie Fus  Register   On: 09/28/2013 07:14   Dg Chest Port 1 View  09/27/2013   CLINICAL DATA:  Respiratory failure.  EXAM: PORTABLE CHEST - 1 VIEW  COMPARISON:  Chest x-ray 09/26/2013.  FINDINGS: There is a right upper extremity PICC with tip terminating in the superior cavoatrial junction. Lung volumes are slightly low. There continues to be widespread airspace disease throughout the lungs bilaterally. No definite pleural effusions. Pulmonary vasculature is obscured by overlying airspace disease. Heart size appears normal. Mediastinal contours are unremarkable.  IMPRESSION: 1. Tip of new right upper extremity PICC is at the superior cavoatrial junction. 2. Persistent bilateral multifocal diffuse airspace opacities appear very similar to yesterday's examination. Although this could be seen in the setting of pulmonary edema, lack of cardiomegaly suggests against this, and the pattern is favored to reflect either multilobar pneumonia, or other diffuse process such as alveolar hemorrhage. Clinical correlation is recommended.   Electronically Signed   By: Trudie Reed M.D.   On: 09/27/2013 07:40    Scheduled Meds: . chlorhexidine  15 mL Mouth Rinse BID  . enoxaparin (LOVENOX) injection  40 mg Subcutaneous Q24H  . feeding supplement (GLUCERNA SHAKE)  237 mL Oral BID BM & HS  . furosemide  40 mg Intravenous Q12H  . insulin  aspart  0-15 Units Subcutaneous Q4H  . levofloxacin  750 mg Oral Daily  . metoCLOPramide (REGLAN) injection  5 mg Intravenous TID AC  . pantoprazole (PROTONIX) IV  40 mg Intravenous QHS  . potassium chloride  40 mEq Oral BID  . pregabalin  100 mg Oral TID  . silver sulfADIAZINE   Topical Daily  . sodium chloride  10-40 mL Intracatheter Q12H   Continuous Infusions: . dextrose 5 % and 0.45% NaCl 50 mL/hr (09/27/13 1447)    Active Problems:   Type 1 diabetes, uncontrolled, with neuropathy   Nausea and vomiting   Noncompliance with diet and medication regimen   Abdominal  pain, other specified site   Aspiration pneumonia   Gastroparesis   Protein-calorie malnutrition, severe   Acute respiratory failure with hypoxia  Time spent: 35  Pamella Pert, MD [facility] Pager [phone]. If 7 PM - 7 AM, please contact night-coverage at [email or web address], password [facility] 09/28/2013, 9:44 AM  LOS: 5 days

## 2013-09-28 NOTE — Progress Notes (Signed)
Inpatient Diabetes Program Recommendations  AACE/ADA: New Consensus Statement on Inpatient Glycemic Control (2013)  Target Ranges:  Prepandial:   less than 140 mg/dL      Peak postprandial:   less than 180 mg/dL (1-2 hours)      Critically ill patients:  140 - 180 mg/dL   Reason for Visit: Hypoglycemia and Type 1 DM  Results for KEIMON, BASALDUA (MRN [id number]) as of 09/28/2013 13:25  Ref. Range 09/26/2013 22:16 09/26/2013 22:56 09/27/2013 00:38 09/27/2013 01:03 09/27/2013 01:48 09/27/2013 04:48 09/27/2013 08:17 09/27/2013 12:14 09/27/2013 17:09 09/27/2013 20:12 09/27/2013 23:44 09/28/2013 03:12 09/28/2013 08:44 09/28/2013 11:29  Glucose-Capillary Latest Range: 70-99 mg/dL 51 (L) 95 78 40 (LL) 413 (H) 155 (H) 118 (H) 188 (H) 184 (H) 174 (H) 243 (H) 184 (H) 185 (H) 137 (H)  Results for DARONTE, SHOSTAK (MRN [id number]) as of 09/28/2013 13:25  Ref. Range 09/23/2013 19:09  Hemoglobin A1C Latest Range: <5.7 % 13.3 (H)    Noted events of past several days with rapid response called 12/19 for lethargy and hypoxia. Pt transferred from 3rd floor to ICU 12/19. Found to have ARDS due to positive rhinovirus. Pt was on BiPAP 100% FIO2 and could not tolerate to have mask off even for eating. Pt intubated today for respiratory insufficiency.  On Novolog mod Q4 hours. If TF is started, will need scheduled insulin coverage for CHO in TF. Will likely need small amount of Lantus for basal needs. Will continue to follow.  Thank you. Ailene Ards, RD, LDN, CDE Inpatient Diabetes Coordinator [phone]

## 2013-09-28 NOTE — Procedures (Signed)
Arterial Catheter Insertion Procedure Note Jason [name] [id number] 1986-06-30  Procedure: Insertion of Arterial Catheter  Indications: Blood pressure monitoring and Frequent blood sampling  Procedure Details Consent: Unable to obtain consent because of altered level of consciousness. Time Out: Verified patient identification, verified procedure, site/side was marked, verified correct patient position, special equipment/implants available, medications/allergies/relevent history reviewed, required imaging and test results available.  Performed  Maximum sterile technique was used including antiseptics, cap, gloves, gown, hand hygiene, mask and sheet. Skin prep: Chlorhexidine; local anesthetic administered 20 gauge catheter was inserted into right radial artery using the Seldinger technique.  Evaluation Blood flow good; BP tracing good. Complications: No apparent complications.   Koren Bound 09/28/2013

## 2013-09-28 NOTE — Procedures (Signed)
Intubation Procedure Note Jason [name] [id number] 1986/05/24  Procedure: Intubation Indications: Respiratory insufficiency  Procedure Details Consent: Risks of procedure as well as the alternatives and risks of each were explained to the (patient/caregiver).  Consent for procedure obtained. Time Out: Verified patient identification, verified procedure, site/side was marked, verified correct patient position, special equipment/implants available, medications/allergies/relevent history reviewed, required imaging and test results available.  Performed  3 Medications:  Fentanyl 100 mcg Etomidate 20 mg Versed 2 mg NMB   Evaluation Hemodynamic Status: BP stable throughout; O2 sats: stable throughout and transiently fell during during procedure Patient's Current Condition: stable Complications: No apparent complications Patient did tolerate procedure well. Chest X-ray ordered to verify placement.  CXR: pending.  Brett Canales Minor ACNP [facility] PCCM Pager [phone] till 3 pm If no answer page [phone] 09/28/2013, 10:59 AM  I was present and supervised the entire procedure.  Alyson Reedy, M.D. [facility] Pulmonary/Critical Care Medicine. Pager: [phone]. After hours pager: [phone].
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
## 2013-09-28 NOTE — Progress Notes (Signed)
NUTRITION FOLLOW UP  Intervention:   - If pt remains intubated for the next 24-48 hours, recommend initiation of enteral nutrition of Oxepa start at 68ml/hr increase by 10 ml every 4 hours to goal of 18ml/hr. Goal rate will provide 1620 calories, 68g protein, free water and meet 97% estimated calorie needs and 97% estimated protein needs - Will continue to monitor   Nutrition Dx:   Inadequate oral intake related to varied appetite as evidenced by 6% weight loss in less than one month - ongoing but now related to inability to eat as evidenced by pt NPO.    Goal:   Pt to meet >/= 90% of their estimated nutrition needs - not met   Monitor:   Weights, labs, TF initiation, vent status  Assessment:   27 y.o. male with past medical history significant for diabetes mellitus with Severe diabetic gastroparesis & neuropathy, GERD presents with above complaints. He states that he has had worsening abdominal pain along with nausea vomiting for the past week.   12/18 - Pt reports that his appetite and PO intake varies greatly week to week. He states his weight fluctuates depending on edema and recent PO intake. Pt reports feeling very weak most of the time; he reports going a whole week at a time with very little PO intake. Obvious muscle wasting evidenced in physical exam. Encouraged pt to drink nutritional supplements and snack as much as tolerated during those times. Pt reports eating poorly this past week due to coughing and vomiting. Weight history shows 6% weight loss in the past month. Pt reports his appetite is fair today; eating at time of visit. Pt seen by RD during previous admission and provided diet tips for gastroparesis. Reviewed information with pt; denies any additional questions or concerns at this time.   12/22 - Noted events of past several days with rapid response called 12/19 for lethargy and hypoxia. Pt transferred from 3rd floor to ICU 12/19. Found to have ARDS due to positive  rhinovirus. Pt was on BiPAP 100% FIO2 and could not tolerate to have mask off even for eating. Pt intubated today for respiratory insufficiency.   Patient is currently intubated on ventilator support.  MV: 7.8 L/min Temp (24hrs), Avg:98.1 F (36.7 C), Min:97.3 F (36.3 C), Max:98.5 F (36.9 C)  Propofol: off  - Sodium slightly low - Alk phos and AST elevated  Height: Ht Readings from Last 1 Encounters:  09/28/13 5\' 8"  (1.727 m)    Weight Status:   Wt Readings from Last 1 Encounters:  09/28/13 139 lb 12.8 oz (63.413 kg)  Admit wt:        130 lb (58.9 kg) Net I/Os: +5.1 L  Re-estimated needs:  Kcal: 1661 Protein: 70-90g Fluid: > 1.6L/day  Skin: Non-pitting RLE, LLE edema  Diet Order:  NPO   Intake/Output Summary (Last 24 hours) at 09/28/13 1140 Last data filed at 09/28/13 0900  Gross per 24 hour  Intake 1370.83 ml  Output   1425 ml  Net -54.17 ml    Last BM: 12/21   Labs:   Recent Labs Lab 09/25/13 2017 09/27/13 0515 09/28/13 0430  NA 130* 135 132*  K 4.2 3.7 4.2  CL 99 104 98  CO2 20 22 22   BUN 13 11 13   CREATININE 1.08 1.08 1.15  CALCIUM 8.3* 8.3* 8.8  MG  --  1.3* 1.9  PHOS  --  3.9 3.5  GLUCOSE 196* 161* 203*    CBG (last 3)  Recent Labs  09/27/13 2344 09/28/13 0312 09/28/13 0844  GLUCAP 243* 184* 185*    Scheduled Meds: . albuterol  2.5 mg Nebulization Q6H  . chlorhexidine  15 mL Mouth Rinse BID  . enoxaparin (LOVENOX) injection  40 mg Subcutaneous Q24H  . etomidate  20 mg Intravenous Once  . feeding supplement (GLUCERNA SHAKE)  237 mL Oral BID BM & HS  . fentaNYL  100 mcg Intravenous Once  . furosemide  40 mg Intravenous Q12H  . insulin aspart  0-15 Units Subcutaneous Q4H  . ipratropium  0.5 mg Nebulization Q6H  . levofloxacin  750 mg Oral Daily  . lidocaine (cardiac) 100 mg/67ml      . metoCLOPramide (REGLAN) injection  5 mg Intravenous TID AC  . midazolam  2 mg Intravenous Once  . pantoprazole (PROTONIX) IV  40 mg  Intravenous QHS  . potassium chloride  40 mEq Oral BID  . pregabalin  100 mg Oral TID  . rocuronium  50 mg Intravenous Once  . silver sulfADIAZINE   Topical Daily  . sodium chloride  10-40 mL Intracatheter Q12H  . succinylcholine        Continuous Infusions: . dextrose 5 % and 0.45% NaCl 50 mL/hr (09/27/13 1447)     Levon Hedger MS, RD, LDN [phone] Pager [phone] After Hours Pager

## 2013-09-28 NOTE — Progress Notes (Signed)
CARE MANAGEMENT NOTE 09/28/2013  Patient:  CHAPMAN, MATTEUCCI   Account Number:  [id number]  Date Initiated:  09/25/2013  Documentation initiated by:  DAVIS,TYMEEKA  Subjective/Objective Assessment:   27 yo male admitted aspiration pneumonia.     Action/Plan:   Home when stable   Anticipated DC Date:  10/01/2013   Anticipated DC Plan:  HOME/SELF CARE  In-house referral  Financial Counselor      DC Planning Services  CM consult      Choice offered to / List presented to:  NA   DME arranged  NA      DME agency  NA     HH arranged  NA      HH agency  NA   Status of service:  In process, will continue to follow Medicare Important Message given?   (If response is "NO", the following Medicare IM given date fields will be blank) Date Medicare IM given:   Date Additional Medicare IM given:    Discharge Disposition:    Per UR Regulation:  Reviewed for med. necessity/level of care/duration of stay  If discussed at Long Length of Stay Meetings, dates discussed:    Comments:  [id number]/Rhonda Stark Jock, BSN, [place] [phone] Chart Reviewed for discharge and hospital needs.  Patient intubated due to increased hypoxia and failed bipap trial. Fi02 at 100%.  Sedated a.line inserted. Discharge needs at time of review:  None present will follow for needs. Review of patient progress due on [id number].   Sharmon Leyden, RN Registered Nurse Signed CASE MANAGEMENT Care Management Note Service date: 09/25/2013 11:02 AM Cm spoke with patient at the bedside concerning self payer status and no PCP on record. Per pt Medicaid application pending. Resides with parents whom assist with medication cost. Cm provided pt with information concerning [employer] and other community resources. No other barriers identified at this time. Financial Counselor to consult.

## 2013-09-29 ENCOUNTER — Inpatient Hospital Stay (HOSPITAL_COMMUNITY): Payer: Medicaid Other

## 2013-09-29 DIAGNOSIS — E119 Type 2 diabetes mellitus without complications: Secondary | ICD-10-CM

## 2013-09-29 LAB — CBC WITH DIFFERENTIAL/PLATELET
Eosinophils Relative: 6 % — ABNORMAL HIGH (ref 0–5)
HCT: 22.3 % — ABNORMAL LOW (ref 39.0–52.0)
Lymphocytes Relative: 17 % (ref 12–46)
Lymphs Abs: 3.2 10*3/uL (ref 0.7–4.0)
MCV: 76.4 fL — ABNORMAL LOW (ref 78.0–100.0)
Monocytes Relative: 6 % (ref 3–12)
Neutro Abs: 13.7 10*3/uL — ABNORMAL HIGH (ref 1.7–7.7)
Neutrophils Relative %: 71 % (ref 43–77)
Platelets: 94 10*3/uL — ABNORMAL LOW (ref 150–400)
RBC: 2.92 MIL/uL — ABNORMAL LOW (ref 4.22–5.81)
RDW: 15.2 % (ref 11.5–15.5)
WBC: 19.1 10*3/uL — ABNORMAL HIGH (ref 4.0–10.5)

## 2013-09-29 LAB — BASIC METABOLIC PANEL
BUN: 13 mg/dL (ref 6–23)
CO2: 19 mEq/L (ref 19–32)
Chloride: 101 mEq/L (ref 96–112)
Creatinine, Ser: 1.41 mg/dL — ABNORMAL HIGH (ref 0.50–1.35)
GFR calc Af Amer: 77 mL/min — ABNORMAL LOW (ref >90)
GFR calc non Af Amer: 66 mL/min — ABNORMAL LOW (ref >90)
Sodium: 133 mEq/L — ABNORMAL LOW (ref 135–145)

## 2013-09-29 LAB — GLUCOSE, CAPILLARY
Glucose-Capillary: 182 mg/dL — ABNORMAL HIGH (ref 70–99)
Glucose-Capillary: 158 mg/dL — ABNORMAL HIGH (ref 70–99)
Glucose-Capillary: 118 mg/dL — ABNORMAL HIGH (ref 70–99)

## 2013-09-29 LAB — BLOOD GAS, ARTERIAL
Acid-base deficit: 6 mmol/L — ABNORMAL HIGH (ref 0.0–2.0)
Bicarbonate: 18.5 mEq/L — ABNORMAL LOW (ref 20.0–24.0)
FIO2: 0.5 %
O2 Saturation: 89.9 %
pO2, Arterial: 64.2 mmHg — ABNORMAL LOW (ref 80.0–100.0)

## 2013-09-29 LAB — MAGNESIUM: Magnesium: 1.6 mg/dL (ref 1.5–2.5)

## 2013-09-29 LAB — PHOSPHORUS: Phosphorus: 4.7 mg/dL — ABNORMAL HIGH (ref 2.3–4.6)

## 2013-09-29 MED ORDER — GLUCERNA 1.2 CAL PO LIQD
1000.0000 mL | ORAL | Status: DC
  Filled 2013-09-29: qty 1000

## 2013-09-29 MED ORDER — OXEPA PO LIQD
1000.0000 mL | ORAL | Status: DC
  Administered 2013-09-29 – 2013-10-01 (×2): 1000 mL
  Filled 2013-09-29 (×3): qty 1000

## 2013-09-29 MED ORDER — OXEPA PO LIQD
1000.0000 mL | ORAL | Status: DC
  Filled 2013-09-29: qty 1000

## 2013-09-29 MED ORDER — LEVOFLOXACIN IN D5W 750 MG/150ML IV SOLN
750.0000 mg | INTRAVENOUS | Status: DC
  Administered 2013-09-29 – 2013-10-01 (×3): 750 mg via INTRAVENOUS
  Filled 2013-09-29 (×4): qty 150

## 2013-09-29 NOTE — Progress Notes (Signed)
ANTIBIOTIC CONSULT NOTE - FOLLOW UP  Pharmacy Consult for Levaquin Indication: rule out pneumonia (aspiration)  Allergies  Allergen Reactions  . Cefuroxime Axetil Anaphylaxis  . Penicillins Anaphylaxis    ?can take amoxicillin?  Kimberlee Nearing [Benzonatate] Anaphylaxis  . Shellfish Allergy Itching    Patient Measurements: Height: 5\' 8"  (172.7 cm) Weight: 139 lb 12.8 oz (63.413 kg) IBW/kg (Calculated) : 68.4  Vital Signs: Temp: 99.6 F (37.6 C) (12/23 0354) Temp src: Oral (12/23 0354) BP: 87/57 mmHg (12/23 0600) Pulse Rate: 100 (12/23 0600)  Labs:  Recent Labs  09/27/13 0515 09/28/13 0430 09/29/13 0647  WBC 25.1* 24.1* 19.1*  HGB 8.0* 8.4* 7.5*  PLT 224 163 94*  CREATININE 1.08 1.15 1.41*   Estimated Creatinine Clearance: 69.3 ml/min (by C-G formula based on Cr of 1.41).   Microbiology: 12/18: sputum: moderate candida albicans 12/19: blood x 2 : ngtd 12/18 MRSA PCR (-) 12/20 influenza PCR (-) 12/20 respiratory virus panel: Rhinovirus detected 12/20 HIV antibody: non reactive  Anti-infectives: 12/17 >> Vanc >> 12/19, ** restart 12/19 >> 12/21 12/17 >> Levaquin >>  12/17 >> Clinda >> 12/19 12/19 >> meropenem >> 12/21  Assessment: 27 yo M admitted 12/17 with r/o aspiration pneumonia.  He was seen at [facility] on 12/16 and diagnosed with pneumonia; at home he had nausea and vomiting and did not get antibiotic Rx filled.  PMH includes severe diabetic gastroparesis and neuropathy, GERD.  Pharmacy is consulted to dose broad spectrum antibiotics as above.  Note multiple antibiotic allergies:  Cefuroxime (anaphylaxis), Penicillins (anaphylaxis)  D7 Levaquin 750 PO q24h for aspiration pneumonia with sepsis  Tmax: 100.2  WBCs: elevated but improved from admit  Renal:  SCr rising (1.08 > 1.15 > 1.41), CrCl ~70 ml/min   Goal of Therapy:  Appropriate abx dosing for renal function, eradication of infection.   Plan:   Continue Levaquin 750mg  q24h but change to IV now  that intubated  Follow renal function and planned duration of therapy   Loralee Pacas, PharmD, BCPS Pager: [phone]  09/29/2013 7:44 AM
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
## 2013-09-29 NOTE — Progress Notes (Signed)
NUTRITION FOLLOW UP/CONSULT FOR TF MANAGEMENT  Intervention:   - Will initiate TF of Oxepa start at 68ml/hr increase by 10 ml every 4 hours to goal of 3ml/hr. Goal rate will provide 2160 calories, 90g protein, and free water which will meet 104% estimated calorie needs, 100% estimated protein needs.  - Will order adult enteral protocol  - Will continue to monitor   Nutrition Dx:   Inadequate oral intake related to inability to eat as evidenced by pt NPO - ongoing    Goal:   Pt to meet >/= 90% of their estimated nutrition needs - not met yet, but plan is to start TF today with goal for enteral nutrition to meet >90% of estimated nutritional needs   Monitor:   Weights, labs, TF tolerance/advancement, vent status  Assessment:   27 y.o. male with past medical history significant for diabetes mellitus with Severe diabetic gastroparesis & neuropathy, GERD presents with above complaints. He states that he has had worsening abdominal pain along with nausea vomiting for the past week.   12/18 - Pt reports that his appetite and PO intake varies greatly week to week. He states his weight fluctuates depending on edema and recent PO intake. Pt reports feeling very weak most of the time; he reports going a whole week at a time with very little PO intake. Obvious muscle wasting evidenced in physical exam. Encouraged pt to drink nutritional supplements and snack as much as tolerated during those times. Pt reports eating poorly this past week due to coughing and vomiting. Weight history shows 6% weight loss in the past month. Pt reports his appetite is fair today; eating at time of visit. Pt seen by RD during previous admission and provided diet tips for gastroparesis. Reviewed information with pt; denies any additional questions or concerns at this time.   12/22 - Noted events of past several days with rapid response called 12/19 for lethargy and hypoxia. Pt transferred from 3rd floor to ICU 12/19.  Found to have ARDS due to positive rhinovirus. Pt was on BiPAP 100% FIO2 and could not tolerate to have mask off even for eating. Pt intubated today for respiratory insufficiency.   12/23 - Received consult for assessment, however per discussion with NP, plan is for RD to manage TF.  Patient is currently intubated on ventilator support.  MV: 13.9 L/min Temp (24hrs), Avg:100 F (37.8 C), Min:99.4 F (37.4 C), Max:100.8 F (38.2 C)  Propofol: off   - Sodium slightly low - Alk phos and AST elevated - Phosphorus elevated   Height: Ht Readings from Last 1 Encounters:  09/28/13 5\' 8"  (1.727 m)    Weight Status:   Wt Readings from Last 1 Encounters:  09/28/13 139 lb 12.8 oz (63.413 kg)  Admit wt:        130 lb (58.9 kg) Net I/Os: +5.1 L  Re-estimated needs:  Kcal: 2067 Protein: 70-90g Fluid: > 2L/day  Skin: Non-pitting RLE, LLE edema  Diet Order:  NPO   Intake/Output Summary (Last 24 hours) at 09/29/13 1028 Last data filed at 09/29/13 0600  Gross per 24 hour  Intake   1433 ml  Output   1390 ml  Net     43 ml    Last BM: 12/21   Labs:   Recent Labs Lab 09/27/13 0515 09/28/13 0430 09/29/13 0647  NA 135 132* 133*  K 3.7 4.2 4.7  CL 104 98 101  CO2 22 22 19   BUN 11 13 13  CREATININE 1.08 1.15 1.41*  CALCIUM 8.3* 8.8 8.8  MG 1.3* 1.9 1.6  PHOS 3.9 3.5 4.7*  GLUCOSE 161* 203* 252*    CBG (last 3)   Recent Labs  09/29/13 0002 09/29/13 0306 09/29/13 0856  GLUCAP 182* 166* 323*    Scheduled Meds: . albuterol  2.5 mg Nebulization Q6H  . chlorhexidine  15 mL Mouth Rinse BID  . enoxaparin (LOVENOX) injection  40 mg Subcutaneous Q24H  . etomidate  20 mg Intravenous Once  . feeding supplement (OXEPA)  1,000 mL Per Tube Q24H  . fentaNYL  100 mcg Intravenous Once  . furosemide  40 mg Intravenous Q12H  . insulin aspart  0-15 Units Subcutaneous Q4H  . ipratropium  0.5 mg Nebulization Q6H  . levofloxacin (LEVAQUIN) IV  750 mg Intravenous Q24H  .  metoCLOPramide (REGLAN) injection  5 mg Intravenous TID AC  . midazolam  2 mg Intravenous Once  . pantoprazole (PROTONIX) IV  40 mg Intravenous QHS  . potassium chloride  40 mEq Oral BID  . pregabalin  100 mg Oral TID  . rocuronium  50 mg Intravenous Once  . silver sulfADIAZINE   Topical Daily  . sodium chloride  10-40 mL Intracatheter Q12H    Continuous Infusions: . dextrose 5 % and 0.45% NaCl 50 mL/hr at 09/28/13 1152  . fentaNYL infusion INTRAVENOUS 100 mcg/hr (09/28/13 1355)  . midazolam (VERSED) infusion 2 mg/hr (09/28/13 1355)     Levon Hedger MS, RD, LDN [phone] Pager [phone] After Hours Pager

## 2013-09-29 NOTE — Progress Notes (Signed)
Inpatient Diabetes Program Recommendations  AACE/ADA: New Consensus Statement on Inpatient Glycemic Control (2013)  Target Ranges:  Prepandial:   less than 140 mg/dL      Peak postprandial:   less than 180 mg/dL (1-2 hours)      Critically ill patients:  140 - 180 mg/dL   Reason for Visit: Hyperglycemia - Type 1 DM on vent  Results for RIVER, AMBROSIO (MRN [id number]) as of 09/29/2013 14:46  Ref. Range 09/28/2013 16:41 09/28/2013 19:51 09/29/2013 00:02 09/29/2013 03:06 09/29/2013 08:56 09/29/2013 11:23  Glucose-Capillary Latest Range: 70-99 mg/dL 478 (H) 295 (H) 621 (H) 166 (H) 323 (H) 239 (H)  Oxepa TF started at 20/hr to goal of 60/hr.  Inpatient Diabetes Program Recommendations Insulin - Basal: Lantus 16 units QHS (half home dose) Correction (SSI): Novolog sensitive Q4 hours Insulin - Meal Coverage: Novolog 3 units Q4 hours for TF coverage.  Will need to titrate until CBGs <180 mg/dL HYQM5H: 84.6% - uncontrolled  Note: Will follow. Thank you. Ailene Ards, RD, LDN, CDE Inpatient Diabetes Coordinator [phone]

## 2013-09-30 ENCOUNTER — Inpatient Hospital Stay (HOSPITAL_COMMUNITY): Payer: Medicaid Other

## 2013-09-30 LAB — CBC WITH DIFFERENTIAL/PLATELET
Basophils Absolute: 0 10*3/uL (ref 0.0–0.1)
Basophils Relative: 0 % (ref 0–1)
HCT: 23.6 % — ABNORMAL LOW (ref 39.0–52.0)
Lymphs Abs: 4.2 10*3/uL — ABNORMAL HIGH (ref 0.7–4.0)
MCH: 26.2 pg (ref 26.0–34.0)
MCV: 77.4 fL — ABNORMAL LOW (ref 78.0–100.0)
Monocytes Relative: 7 % (ref 3–12)
Neutro Abs: 17.8 10*3/uL — ABNORMAL HIGH (ref 1.7–7.7)
Platelets: 84 10*3/uL — ABNORMAL LOW (ref 150–400)
RDW: 15.2 % (ref 11.5–15.5)
WBC: 24.7 10*3/uL — ABNORMAL HIGH (ref 4.0–10.5)

## 2013-09-30 LAB — BLOOD GAS, ARTERIAL
Bicarbonate: 17.8 mEq/L — ABNORMAL LOW (ref 20.0–24.0)
Drawn by: 232811
O2 Saturation: 93.5 %
PEEP: 8 cmH2O
Patient temperature: 98.6
Pressure control: 20 cmH2O
RATE: 16 resp/min
pH, Arterial: 7.34 — ABNORMAL LOW (ref 7.350–7.450)

## 2013-09-30 LAB — GLUCOSE, CAPILLARY
Glucose-Capillary: 266 mg/dL — ABNORMAL HIGH (ref 70–99)
Glucose-Capillary: 365 mg/dL — ABNORMAL HIGH (ref 70–99)
Glucose-Capillary: 289 mg/dL — ABNORMAL HIGH (ref 70–99)
Glucose-Capillary: 303 mg/dL — ABNORMAL HIGH (ref 70–99)

## 2013-09-30 LAB — BASIC METABOLIC PANEL
BUN: 16 mg/dL (ref 6–23)
CO2: 18 mEq/L — ABNORMAL LOW (ref 19–32)
Chloride: 100 mEq/L (ref 96–112)
Creatinine, Ser: 1.48 mg/dL — ABNORMAL HIGH (ref 0.50–1.35)

## 2013-09-30 MED ORDER — SODIUM CHLORIDE 0.9 % IV SOLN
INTRAVENOUS | Status: DC
  Administered 2013-10-02: 20 mL via INTRAVENOUS

## 2013-09-30 MED ORDER — METHYLPREDNISOLONE SODIUM SUCC 125 MG IJ SOLR
60.0000 mg | Freq: Three times a day (TID) | INTRAMUSCULAR | Status: DC
  Administered 2013-09-30 – 2013-10-03 (×9): 60 mg via INTRAVENOUS
  Filled 2013-09-30 (×5): qty 0.96
  Filled 2013-09-30: qty 2
  Filled 2013-09-30 (×7): qty 0.96

## 2013-09-30 MED ORDER — INSULIN GLARGINE 100 UNIT/ML ~~LOC~~ SOLN
15.0000 [IU] | Freq: Two times a day (BID) | SUBCUTANEOUS | Status: DC
  Administered 2013-09-30 – 2013-10-01 (×4): 15 [IU] via SUBCUTANEOUS
  Filled 2013-09-30 (×6): qty 0.15
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
## 2013-09-30 NOTE — Progress Notes (Signed)
Wound assessment & treatment:  Elbow = 1.5x1.2x0 Tx Vaseline gauze & tegaderm R Shin = .4x.3x0 Tx Silverdene coating & gauze & tegaderm R Foot = 11.3x8.4x0 Tx Silverdene coating, Non adherent pad and paper tape R Calf = 4.0x3.0xo Tx Vaseline gauze & tegaderm L Great Toe = 3.0x5.2x0 Tx Silverdene coating, Non adherent pad and paper tape L Little Toe = 3.0x2.5x0 Tx Silverdene coating , Non adherent pad and paper tape  Alevon dressing applied to heels.

## 2013-09-30 NOTE — Progress Notes (Signed)
Inpatient Diabetes Program Recommendations  AACE/ADA: New Consensus Statement on Inpatient Glycemic Control (2013)  Target Ranges:  Prepandial:   less than 140 mg/dL      Peak postprandial:   less than 180 mg/dL (1-2 hours)      Critically ill patients:  140 - 180 mg/dL   Pt would benefit from using the IV insulin drip per GlucoStabilizer, expecially while on Solumedrol.   Pt is documented as having type 1 diabetes. q 4 hrs correction insulin is not controlling pt glucose. CBG's are consistently in 200's and 300's today.  Thank you, Lenor Coffin, RN, CNS, Diabetes Coordinator [phone])

## 2013-09-30 NOTE — Progress Notes (Signed)
Pt in PC 10, PEEP 5, RR 16 verbal order per DR. Molli Knock.

## 2013-10-01 ENCOUNTER — Inpatient Hospital Stay (HOSPITAL_COMMUNITY): Payer: Medicaid Other

## 2013-10-01 DIAGNOSIS — G8929 Other chronic pain: Secondary | ICD-10-CM

## 2013-10-01 DIAGNOSIS — M545 Low back pain: Secondary | ICD-10-CM

## 2013-10-01 LAB — BASIC METABOLIC PANEL
BUN: 23 mg/dL (ref 6–23)
CO2: 21 mEq/L (ref 19–32)
Calcium: 8.7 mg/dL (ref 8.4–10.5)
Chloride: 102 mEq/L (ref 96–112)
Creatinine, Ser: 1.33 mg/dL (ref 0.50–1.35)
GFR calc non Af Amer: 71 mL/min — ABNORMAL LOW (ref >90)
Glucose, Bld: 258 mg/dL — ABNORMAL HIGH (ref 70–99)
Potassium: 5 mEq/L (ref 3.5–5.1)
Sodium: 133 mEq/L — ABNORMAL LOW (ref 135–145)

## 2013-10-01 LAB — CBC WITH DIFFERENTIAL/PLATELET
Basophils Absolute: 0 10*3/uL (ref 0.0–0.1)
Basophils Relative: 0 % (ref 0–1)
Eosinophils Absolute: 0 10*3/uL (ref 0.0–0.7)
Eosinophils Relative: 0 % (ref 0–5)
HCT: 24.6 % — ABNORMAL LOW (ref 39.0–52.0)
Hemoglobin: 8.1 g/dL — ABNORMAL LOW (ref 13.0–17.0)
Lymphocytes Relative: 6 % — ABNORMAL LOW (ref 12–46)
MCH: 25.2 pg — ABNORMAL LOW (ref 26.0–34.0)
MCHC: 32.9 g/dL (ref 30.0–36.0)
Monocytes Relative: 3 % (ref 3–12)
Neutro Abs: 24.5 10*3/uL — ABNORMAL HIGH (ref 1.7–7.7)
Neutrophils Relative %: 91 % — ABNORMAL HIGH (ref 43–77)
Platelets: 101 10*3/uL — ABNORMAL LOW (ref 150–400)
RBC: 3.22 MIL/uL — ABNORMAL LOW (ref 4.22–5.81)
RDW: 15.3 % (ref 11.5–15.5)
WBC: 26.9 10*3/uL — ABNORMAL HIGH (ref 4.0–10.5)

## 2013-10-01 LAB — GLUCOSE, CAPILLARY
Glucose-Capillary: 217 mg/dL — ABNORMAL HIGH (ref 70–99)
Glucose-Capillary: 232 mg/dL — ABNORMAL HIGH (ref 70–99)
Glucose-Capillary: 265 mg/dL — ABNORMAL HIGH (ref 70–99)
Glucose-Capillary: 271 mg/dL — ABNORMAL HIGH (ref 70–99)
Glucose-Capillary: 294 mg/dL — ABNORMAL HIGH (ref 70–99)

## 2013-10-01 MED ORDER — DEXMEDETOMIDINE HCL IN NACL 400 MCG/100ML IV SOLN
0.4000 ug/kg/h | INTRAVENOUS | Status: DC
  Administered 2013-10-01: 0.4 ug/kg/h via INTRAVENOUS
  Filled 2013-10-01: qty 100

## 2013-10-01 MED ORDER — FENTANYL CITRATE 0.05 MG/ML IJ SOLN
50.0000 ug | Freq: Once | INTRAMUSCULAR | Status: AC
  Administered 2013-10-01: 50 ug via INTRAVENOUS

## 2013-10-01 MED ORDER — SODIUM CHLORIDE 0.9 % IJ SOLN: 9.0000 mL | INTRAMUSCULAR | Status: DC | PRN

## 2013-10-01 MED ORDER — PROPOFOL 10 MG/ML IV EMUL
0.0000 ug/kg/min | INTRAVENOUS | Status: DC
  Administered 2013-10-01: 10 ug/kg/min via INTRAVENOUS
  Administered 2013-10-02: 40 ug/kg/min via INTRAVENOUS
  Filled 2013-10-01 (×2): qty 100

## 2013-10-01 MED ORDER — OXEPA PO LIQD
1000.0000 mL | ORAL | Status: DC
  Administered 2013-10-01: 1000 mL
  Filled 2013-10-01 (×2): qty 1000

## 2013-10-01 MED ORDER — HYDROMORPHONE 0.3 MG/ML IV SOLN
INTRAVENOUS | Status: DC
  Administered 2013-10-01: 1.7 mg via INTRAVENOUS
  Administered 2013-10-01: 0.6 mg via INTRAVENOUS
  Administered 2013-10-01: 13:00:00 via INTRAVENOUS
  Filled 2013-10-01: qty 25

## 2013-10-01 MED ORDER — FENTANYL CITRATE 0.05 MG/ML IJ SOLN
0.0000 ug/h | INTRAMUSCULAR | Status: DC
  Administered 2013-10-01: 50 ug/h via INTRAVENOUS
  Filled 2013-10-01 (×3): qty 50

## 2013-10-01 MED ORDER — FENTANYL BOLUS VIA INFUSION
25.0000 ug | INTRAVENOUS | Status: DC | PRN
  Filled 2013-10-01: qty 50

## 2013-10-01 MED ORDER — NALOXONE HCL 0.4 MG/ML IJ SOLN: 0.4000 mg | INTRAMUSCULAR | Status: DC | PRN
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
## 2013-10-01 NOTE — Progress Notes (Signed)
Wasted 35 mL of Versed from 50 mL bag - 1mg /mL. Witnessed by Redgie Grayer, RN. Wasted in sink.

## 2013-10-01 NOTE — Progress Notes (Signed)
eLink Physician-Brief Progress Note Patient Name: Jason [name] DOB: 22-Mar-1986 MRN: [id number]  Date of Service  10/01/2013   HPI/Events of Note  Htn, aggitation, pain not cvontrolled  eICU Interventions  Dc diluaded drip, dc precedex, not working Add porp, fent drip Re eval htbn   Intervention Category Major Interventions: Delirium, psychosis, severe agitation - evaluation and management  Emmett Arntz J. 10/01/2013, 8:59 PM

## 2013-10-02 ENCOUNTER — Inpatient Hospital Stay (HOSPITAL_COMMUNITY): Payer: Medicaid Other

## 2013-10-02 DIAGNOSIS — K3184 Gastroparesis: Secondary | ICD-10-CM

## 2013-10-02 LAB — BLOOD GAS, ARTERIAL
PEEP: 5 cmH2O
Patient temperature: 98.6
Pressure control: 20 cmH2O
RATE: 16 resp/min
TCO2: 22.6 mmol/L (ref 0–100)
pCO2 arterial: 46.7 mmHg — ABNORMAL HIGH (ref 35.0–45.0)
pH, Arterial: 7.323 — ABNORMAL LOW (ref 7.350–7.450)

## 2013-10-02 LAB — GLUCOSE, CAPILLARY
Glucose-Capillary: 239 mg/dL — ABNORMAL HIGH (ref 70–99)
Glucose-Capillary: 219 mg/dL — ABNORMAL HIGH (ref 70–99)
Glucose-Capillary: 166 mg/dL — ABNORMAL HIGH (ref 70–99)
Glucose-Capillary: 170 mg/dL — ABNORMAL HIGH (ref 70–99)
Glucose-Capillary: 85 mg/dL (ref 70–99)

## 2013-10-02 LAB — CBC WITH DIFFERENTIAL/PLATELET
Basophils Absolute: 0 10*3/uL (ref 0.0–0.1)
Basophils Relative: 0 % (ref 0–1)
Eosinophils Relative: 0 % (ref 0–5)
HCT: 25.6 % — ABNORMAL LOW (ref 39.0–52.0)
Lymphocytes Relative: 5 % — ABNORMAL LOW (ref 12–46)
Lymphs Abs: 2 10*3/uL (ref 0.7–4.0)
MCV: 78.5 fL (ref 78.0–100.0)
Monocytes Absolute: 2 10*3/uL — ABNORMAL HIGH (ref 0.1–1.0)
Monocytes Relative: 5 % (ref 3–12)
Neutro Abs: 35.8 10*3/uL — ABNORMAL HIGH (ref 1.7–7.7)
Platelets: 150 10*3/uL (ref 150–400)
RBC: 3.26 MIL/uL — ABNORMAL LOW (ref 4.22–5.81)
RDW: 15.6 % — ABNORMAL HIGH (ref 11.5–15.5)
WBC: 39.8 10*3/uL — ABNORMAL HIGH (ref 4.0–10.5)

## 2013-10-02 LAB — HEPARIN INDUCED THROMBOCYTOPENIA PNL
Heparin Induced Plt Ab: NEGATIVE
Patient O.D.: 0.182
UFH High Dose UFH H: 0 % Release
UFH Low Dose 0.1 IU/mL: 0 % Release
UFH Low Dose 0.5 IU/mL: 0 % Release

## 2013-10-02 LAB — BASIC METABOLIC PANEL
BUN: 30 mg/dL — ABNORMAL HIGH (ref 6–23)
CO2: 23 mEq/L (ref 19–32)
Calcium: 8.6 mg/dL (ref 8.4–10.5)
Creatinine, Ser: 1.24 mg/dL (ref 0.50–1.35)
GFR calc Af Amer: 90 mL/min — ABNORMAL LOW (ref >90)
GFR calc non Af Amer: 77 mL/min — ABNORMAL LOW (ref >90)
Potassium: 5.6 mEq/L — ABNORMAL HIGH (ref 3.5–5.1)

## 2013-10-02 LAB — MAGNESIUM: Magnesium: 1.9 mg/dL (ref 1.5–2.5)

## 2013-10-02 LAB — CULTURE, BLOOD (ROUTINE X 2)
Culture: NO GROWTH
Culture: NO GROWTH

## 2013-10-02 LAB — PHOSPHORUS: Phosphorus: 4.7 mg/dL — ABNORMAL HIGH (ref 2.3–4.6)

## 2013-10-02 LAB — PRO B NATRIURETIC PEPTIDE: Pro B Natriuretic peptide (BNP): 2451 pg/mL — ABNORMAL HIGH (ref 0–125)

## 2013-10-02 MED ORDER — MORPHINE SULFATE ER 15 MG PO TBCR
15.0000 mg | EXTENDED_RELEASE_TABLET | Freq: Two times a day (BID) | ORAL | Status: DC
  Administered 2013-10-02 – 2013-10-05 (×6): 15 mg via ORAL
  Filled 2013-10-02 (×6): qty 1

## 2013-10-02 MED ORDER — INSULIN ASPART 100 UNIT/ML ~~LOC~~ SOLN
0.0000 [IU] | Freq: Every day | SUBCUTANEOUS | Status: DC
  Administered 2013-10-03: 3 [IU] via SUBCUTANEOUS

## 2013-10-02 MED ORDER — OXYCODONE-ACETAMINOPHEN 5-325 MG PO TABS
1.0000 | ORAL_TABLET | ORAL | Status: DC | PRN
  Administered 2013-10-03 – 2013-10-05 (×10): 2 via ORAL
  Filled 2013-10-02 (×10): qty 2

## 2013-10-02 MED ORDER — PANTOPRAZOLE SODIUM 40 MG PO TBEC
40.0000 mg | DELAYED_RELEASE_TABLET | Freq: Every day | ORAL | Status: DC
  Administered 2013-10-02 – 2013-10-04 (×3): 40 mg via ORAL
  Filled 2013-10-02 (×4): qty 1

## 2013-10-02 MED ORDER — OXYCODONE-ACETAMINOPHEN 5-325 MG PO TABS
1.0000 | ORAL_TABLET | Freq: Four times a day (QID) | ORAL | Status: DC | PRN
  Administered 2013-10-02 (×2): 2 via ORAL
  Filled 2013-10-02 (×2): qty 2

## 2013-10-02 MED ORDER — LEVOFLOXACIN 750 MG PO TABS
750.0000 mg | ORAL_TABLET | Freq: Every day | ORAL | Status: AC
  Administered 2013-10-03 – 2013-10-04 (×2): 750 mg via ORAL
  Filled 2013-10-02 (×2): qty 1

## 2013-10-02 MED ORDER — INSULIN GLARGINE 100 UNIT/ML ~~LOC~~ SOLN
15.0000 [IU] | Freq: Every day | SUBCUTANEOUS | Status: DC
  Filled 2013-10-02: qty 0.15

## 2013-10-02 MED ORDER — INSULIN GLARGINE 100 UNIT/ML ~~LOC~~ SOLN
20.0000 [IU] | Freq: Two times a day (BID) | SUBCUTANEOUS | Status: DC
  Administered 2013-10-02 – 2013-10-03 (×3): 20 [IU] via SUBCUTANEOUS
  Filled 2013-10-02 (×3): qty 0.2

## 2013-10-02 MED ORDER — SODIUM POLYSTYRENE SULFONATE 15 GM/60ML PO SUSP
30.0000 g | Freq: Once | ORAL | Status: AC
  Administered 2013-10-02: 30 g
  Filled 2013-10-02: qty 120

## 2013-10-02 MED ORDER — FUROSEMIDE 10 MG/ML IJ SOLN
INTRAMUSCULAR | Status: AC
  Filled 2013-10-02: qty 4

## 2013-10-02 MED ORDER — FUROSEMIDE 10 MG/ML IJ SOLN
40.0000 mg | Freq: Once | INTRAMUSCULAR | Status: AC
  Administered 2013-10-02: 40 mg via INTRAVENOUS

## 2013-10-02 MED ORDER — ADULT MULTIVITAMIN W/MINERALS CH
1.0000 | ORAL_TABLET | Freq: Every day | ORAL | Status: DC
  Administered 2013-10-02 – 2013-10-04 (×2): 1 via ORAL
  Filled 2013-10-02 (×4): qty 1

## 2013-10-02 MED ORDER — LEVOFLOXACIN IN D5W 750 MG/150ML IV SOLN
750.0000 mg | Freq: Once | INTRAVENOUS | Status: AC
  Administered 2013-10-02: 750 mg via INTRAVENOUS
  Filled 2013-10-02: qty 150

## 2013-10-02 MED ORDER — GLUCERNA SHAKE PO LIQD
237.0000 mL | Freq: Two times a day (BID) | ORAL | Status: DC
  Administered 2013-10-02 – 2013-10-05 (×4): 237 mL via ORAL
  Filled 2013-10-02 (×8): qty 237

## 2013-10-02 MED ORDER — INSULIN ASPART 100 UNIT/ML ~~LOC~~ SOLN
3.0000 [IU] | Freq: Three times a day (TID) | SUBCUTANEOUS | Status: DC
  Administered 2013-10-03 (×2): via SUBCUTANEOUS
  Administered 2013-10-04 (×2): 3 [IU] via SUBCUTANEOUS

## 2013-10-02 MED ORDER — INSULIN ASPART 100 UNIT/ML ~~LOC~~ SOLN
0.0000 [IU] | Freq: Three times a day (TID) | SUBCUTANEOUS | Status: DC
  Administered 2013-10-03 (×2): 3 [IU] via SUBCUTANEOUS
  Administered 2013-10-04: 5 [IU] via SUBCUTANEOUS
  Administered 2013-10-04: 3 [IU] via SUBCUTANEOUS
  Administered 2013-10-04: 2 [IU] via SUBCUTANEOUS
  Administered 2013-10-05: 5 [IU] via SUBCUTANEOUS

## 2013-10-02 MED ORDER — FUROSEMIDE 10 MG/ML IJ SOLN
40.0000 mg | Freq: Three times a day (TID) | INTRAMUSCULAR | Status: AC
  Administered 2013-10-02: 40 mg via INTRAVENOUS
  Filled 2013-10-02: qty 4

## 2013-10-02 NOTE — Progress Notes (Signed)
ANTIBIOTIC CONSULT NOTE - FOLLOW UP  Pharmacy Consult for Levaquin Indication: aspiration PNA  Allergies  Allergen Reactions  . Cefuroxime Axetil Anaphylaxis  . Penicillins Anaphylaxis    ?can take amoxicillin?  Kimberlee Nearing [Benzonatate] Anaphylaxis  . Shellfish Allergy Itching    Assessment: 22 yoM with DM1 admitted 12/17 for r/o aspiration pneumonia. He was seen at [facility] on 12/16 and diagnosed with pneumonia; at home he had nausea and vomiting and did not get antibiotic Rx filled. CXR highly suspicious for aspiration pneumonia with b/l, R >L, lower lobe infiltrates.   12/17 >> Vanc >> 12/19 >> resumed 12/19 >> 12/21 12/17 >> Levaquin >>  12/17 >> Clinda >> 12/19 12/19 >> meropenem >> 12/21  Tmax: afeb WBCs: Up to 39.8K (on Solu-medrol) Renal: SCr 1.24, CG 76  12/18 sputum: moderate candida albicans 12/19 blood x 2: ngtd 12/18 MRSA PCR (-) 12/20 influenza PCR (-) 12/20 respiratory virus panel: Rhinovirus detected 12/20 HIV antibody: non-reactive  Plan extubation today. Per CCM, ok to place 14 day stop date on Levaquin and change to oral starting tomorrow.    Plan:   Levaquin 750 mg IV x 1 today then Levaquin 750 mg po daily x 2 doses for a total of 14 days of therapy.   Pharmacy will sign off   Geoffry Paradise, PharmD, BCPS Pager: [phone] 9:36 AM Pharmacy #: [phone]
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
## 2013-10-02 NOTE — Progress Notes (Signed)
eLink Physician-Brief Progress Note Patient Name: Jason [name] DOB: 01-17-1986 MRN: [id number]  Date of Service  10/02/2013   HPI/Events of Note   Mild hyperk  eICU Interventions  Ph wnl kayxlate bmet in 4 hrs   Intervention Category Major Interventions: Electrolyte abnormality - evaluation and management  Briana Newman J. 10/02/2013, 6:18 AM

## 2013-10-02 NOTE — Plan of Care (Signed)
Problem: Phase II Progression Outcomes Goal: Time pt extubated/weaned off vent Outcome: Completed/Met Date Met:  10/02/13 Extubated at about 0900 10/02/13

## 2013-10-02 NOTE — Progress Notes (Signed)
CARE MANAGEMENT NOTE 10/02/2013  Patient:  Jason [name], Jason [name]   Account Number:  [id number]  Date Initiated:  09/25/2013  Documentation initiated by:  Wladyslawa Disbro,TYMEEKA  Subjective/Objective Assessment:   27 yo male admitted aspiration pneumonia.     Action/Plan:   Home when stable   Anticipated DC Date:  10/05/2013   Anticipated DC Plan:  HOME/SELF CARE  In-house referral  Financial Counselor      DC Planning Services  CM consult      Choice offered to / List presented to:  NA   DME arranged  NA      DME agency  NA     HH arranged  NA      HH agency  NA   Status of service:  In process, will continue to follow Medicare Important Message given?   (If response is "NO", the following Medicare IM given date fields will be blank) Date Medicare IM given:   Date Additional Medicare IM given:    Discharge Disposition:    Per UR Regulation:  Reviewed for med. necessity/level of care/duration of stay  If discussed at Long Length of Stay Meetings, dates discussed:    Comments:  [id number]/Sabrea Sankey Lorrin Mais: Case management [id number] Chart reviewed and updated.  Next chart review due on [id number]. Needs for discharge at time of review:  none extubated on [id number], still requiring 40% Fi02 for support.  [id number] Earlene Plater, RN, BSN, [place] [phone] Chart Reviewed for discharge and hospital needs.  Patient intubated due to increased hypoxia and failed bipap trial. Fi02 at 100%.  Sedated a.line inserted. Discharge needs at time of review:  None present will follow for needs. Review of patient progress due on [id number].   Sharmon Leyden, RN Registered Nurse Signed CASE MANAGEMENT Care Management Note Service date: 09/25/2013 11:02 AM Cm spoke with patient at the bedside concerning self payer status and no PCP on record. Per pt Medicaid application pending. Resides with parents whom assist with medication cost. Cm provided pt with information concerning [facility] and other community resources. No other barriers identified at this time. Financial Counselor to consult.

## 2013-10-02 NOTE — Plan of Care (Signed)
Problem: Phase II Progression Outcomes Goal: Date pt extubated/weaned off vent Outcome: Completed/Met Date Met:  10/02/13 Pt extubated 12/26.14

## 2013-10-02 NOTE — Progress Notes (Signed)
Wasted 140 mL of 250 mL bag of fentanyl 33mcg/mL. Witnessed by Burnell Blanks, RN. Wasted in sink.

## 2013-10-02 NOTE — Progress Notes (Signed)
NUTRITION FOLLOW UP  Intervention:   Provide Glucerna Shakes BID Provide Multivitamin with minerals daily RD to continue to monitor  Nutrition Dx:   Inadequate oral intake related to inability to eat as evidenced by pt NPO - ongoing    Goal:   Pt to meet >/= 90% of their estimated nutrition needs - not currently met  Monitor:   Weights, labs, TF tolerance/advancement, vent status  Assessment:   27 y.o. male with past medical history significant for diabetes mellitus with Severe diabetic gastroparesis & neuropathy, GERD presents with above complaints. He states that he has had worsening abdominal pain along with nausea vomiting for the past week.   12/18 - Pt reports that his appetite and PO intake varies greatly week to week. He states his weight fluctuates depending on edema and recent PO intake. Pt reports feeling very weak most of the time; he reports going a whole week at a time with very little PO intake. Obvious muscle wasting evidenced in physical exam. Encouraged pt to drink nutritional supplements and snack as much as tolerated during those times. Pt reports eating poorly this past week due to coughing and vomiting. Weight history shows 6% weight loss in the past month. Pt reports his appetite is fair today; eating at time of visit. Pt seen by RD during previous admission and provided diet tips for gastroparesis. Reviewed information with pt; denies any additional questions or concerns at this time.   12/22 - Noted events of past several days with rapid response called 12/19 for lethargy and hypoxia. Pt transferred from 3rd floor to ICU 12/19. Found to have ARDS due to positive rhinovirus. Pt was on BiPAP 100% FIO2 and could not tolerate to have mask off even for eating. Pt intubated today for respiratory insufficiency.   12/23 - Received consult for assessment, however per discussion with NP, plan is for RD to manage TF.  12/26 Pt extubated this AM. On bedside commode at time of  visit. Pt was receiving tube feedings (Oxepa) at goal rate of 60 ml/hr while intubated. Diet already advanced to Carb modified. RD met with pt 12/18 and pt met criteria for severe malnutrition of acute illness. Will add back nutritional supplements.   Height: Ht Readings from Last 1 Encounters:  09/28/13 5\' 8"  (1.727 m)    Weight Status:   Wt Readings from Last 1 Encounters:  10/02/13 134 lb 7.7 oz (61 kg)  Admit wt:        130 lb (58.9 kg) Net I/Os: +8.3 L  Re-estimated needs:  Kcal: 1800-2000 Protein: 70-80 grams Fluid:  2 L/ day  Skin: +2 RUE and LUE edema; Non-pitting RLE, LLE edema; diabetic ulcer on left toe  Diet Order: Carb ControlNPO   Intake/Output Summary (Last 24 hours) at 10/02/13 1009 Last data filed at 10/02/13 0800  Gross per 24 hour  Intake 2402.57 ml  Output    905 ml  Net 1497.57 ml    Last BM: 12/21   Labs:   Recent Labs Lab 09/29/13 0647 09/30/13 0310 10/01/13 0418 10/02/13 0445  NA 133* 132* 133* 134*  K 4.7 5.2* 5.0 5.6*  CL 101 100 102 103  CO2 19 18* 21 23  BUN 13 16 23  30*  CREATININE 1.41* 1.48* 1.33 1.24  CALCIUM 8.8 8.6 8.7 8.6  MG 1.6 1.6  --  1.9  PHOS 4.7* 4.5  --  4.7*  GLUCOSE 252* 303* 258* 271*    CBG (last 3)   Recent  Labs  10/01/13 2344 10/02/13 0350 10/02/13 0746  GLUCAP 216* 239* 219*    Scheduled Meds: . albuterol  2.5 mg Nebulization Q6H  . chlorhexidine  15 mL Mouth Rinse BID  . enoxaparin (LOVENOX) injection  40 mg Subcutaneous Q24H  . furosemide      . furosemide  40 mg Intravenous Q8H  . insulin aspart  0-15 Units Subcutaneous Q4H  . insulin glargine  20 Units Subcutaneous Q12H  . ipratropium  0.5 mg Nebulization Q6H  . levofloxacin (LEVAQUIN) IV  750 mg Intravenous Once   Followed by  . [START ON 10/03/2013] levofloxacin  750 mg Oral Daily  . methylPREDNISolone (SOLU-MEDROL) injection  60 mg Intravenous Q8H  . metoCLOPramide (REGLAN) injection  5 mg Intravenous TID AC  . midazolam  2 mg  Intravenous Once  . pantoprazole  40 mg Oral QHS  . pregabalin  100 mg Oral TID  . silver sulfADIAZINE   Topical Daily  . sodium chloride  10-40 mL Intracatheter Q12H    Continuous Infusions: . sodium chloride 20 mL (10/02/13 0416)  . feeding supplement (OXEPA) 1,000 mL (10/01/13 2100)  . fentaNYL infusion INTRAVENOUS Stopped (10/02/13 0913)  . propofol Stopped (10/02/13 0720)     Ian Malkin RD, LDN Inpatient Clinical Dietitian Pager: [phone] After Hours Pager: [phone]

## 2013-10-02 NOTE — Procedures (Signed)
Extubation Procedure Note  Patient Details:   Name: Jason [name] DOB: Mar 13, 1986 MRN: [id number]   Airway Documentation:     Evaluation  O2 sats: stable throughout Complications: No apparent complications Patient did tolerate procedure well. Bilateral Breath Sounds: Diminished Suctioning: Airway Yes  Suzan Garibaldi 10/02/2013, 9:30 AM

## 2013-10-03 DIAGNOSIS — F17200 Nicotine dependence, unspecified, uncomplicated: Secondary | ICD-10-CM

## 2013-10-03 DIAGNOSIS — B348 Other viral infections of unspecified site: Secondary | ICD-10-CM | POA: Diagnosis present

## 2013-10-03 DIAGNOSIS — E1142 Type 2 diabetes mellitus with diabetic polyneuropathy: Secondary | ICD-10-CM

## 2013-10-03 DIAGNOSIS — B9789 Other viral agents as the cause of diseases classified elsewhere: Secondary | ICD-10-CM

## 2013-10-03 DIAGNOSIS — E43 Unspecified severe protein-calorie malnutrition: Secondary | ICD-10-CM

## 2013-10-03 DIAGNOSIS — E1149 Type 2 diabetes mellitus with other diabetic neurological complication: Secondary | ICD-10-CM

## 2013-10-03 DIAGNOSIS — E1049 Type 1 diabetes mellitus with other diabetic neurological complication: Secondary | ICD-10-CM

## 2013-10-03 LAB — CBC WITH DIFFERENTIAL/PLATELET
Basophils Relative: 0 % (ref 0–1)
Eosinophils Absolute: 0 10*3/uL (ref 0.0–0.7)
Eosinophils Relative: 0 % (ref 0–5)
Hemoglobin: 7.6 g/dL — ABNORMAL LOW (ref 13.0–17.0)
Lymphocytes Relative: 8 % — ABNORMAL LOW (ref 12–46)
Lymphs Abs: 2.2 10*3/uL (ref 0.7–4.0)
Monocytes Relative: 9 % (ref 3–12)
Neutrophils Relative %: 83 % — ABNORMAL HIGH (ref 43–77)
RBC: 2.92 MIL/uL — ABNORMAL LOW (ref 4.22–5.81)
WBC: 27.6 10*3/uL — ABNORMAL HIGH (ref 4.0–10.5)

## 2013-10-03 LAB — BASIC METABOLIC PANEL
BUN: 29 mg/dL — ABNORMAL HIGH (ref 6–23)
Chloride: 98 mEq/L (ref 96–112)
Creatinine, Ser: 1.37 mg/dL — ABNORMAL HIGH (ref 0.50–1.35)
GFR calc Af Amer: 79 mL/min — ABNORMAL LOW (ref >90)
GFR calc non Af Amer: 69 mL/min — ABNORMAL LOW (ref >90)
Glucose, Bld: 83 mg/dL (ref 70–99)
Sodium: 134 mEq/L — ABNORMAL LOW (ref 135–145)

## 2013-10-03 LAB — GLUCOSE, CAPILLARY
Glucose-Capillary: 54 mg/dL — ABNORMAL LOW (ref 70–99)
Glucose-Capillary: 102 mg/dL — ABNORMAL HIGH (ref 70–99)
Glucose-Capillary: 107 mg/dL — ABNORMAL HIGH (ref 70–99)
Glucose-Capillary: 195 mg/dL — ABNORMAL HIGH (ref 70–99)

## 2013-10-03 LAB — MAGNESIUM: Magnesium: 1.6 mg/dL (ref 1.5–2.5)

## 2013-10-03 LAB — PHOSPHORUS: Phosphorus: 4 mg/dL (ref 2.3–4.6)

## 2013-10-03 MED ORDER — METHYLPREDNISOLONE SODIUM SUCC 40 MG IJ SOLR
40.0000 mg | Freq: Two times a day (BID) | INTRAMUSCULAR | Status: DC
  Administered 2013-10-03 – 2013-10-04 (×2): 40 mg via INTRAVENOUS
  Filled 2013-10-03 (×3): qty 1

## 2013-10-03 MED ORDER — INSULIN GLARGINE 100 UNIT/ML ~~LOC~~ SOLN
15.0000 [IU] | Freq: Two times a day (BID) | SUBCUTANEOUS | Status: DC
  Administered 2013-10-03 – 2013-10-04 (×2): 15 [IU] via SUBCUTANEOUS
  Filled 2013-10-03 (×3): qty 0.15
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
## 2013-10-03 NOTE — Progress Notes (Signed)
On 12/26, patient's CBGs were in the 200s and pharmacy received a verbal order from Dr. Molli Knock to adjust Lantus as needed to achieve desired CBGs with high dose steroids on board.  Pharmacy increased Lantus from 15 mg units BID to 20 units BID. Patient was extubated later 12/26 and SSI insulin was changed to SSI TID with meals with HS coverage and 3 units with meals. Solu-medrol now being tapered down. CBGs earlier this AM = 54, 64, 102, 107.    Plan: Pharmacy will change Lantus back to dose ordered by MD yesterday at 15 units BID. Please adjust further as needed.   Geoffry Paradise, PharmD, BCPS Pager: [phone] 10:33 AM Pharmacy #: [phone]

## 2013-10-03 NOTE — Progress Notes (Signed)
Wasted 15ml of Hydromophone from PCA syringe, waste wittnessed by [employer]. Med wasted in [place].

## 2013-10-04 LAB — GLUCOSE, CAPILLARY
Glucose-Capillary: 171 mg/dL — ABNORMAL HIGH (ref 70–99)
Glucose-Capillary: 164 mg/dL — ABNORMAL HIGH (ref 70–99)

## 2013-10-04 LAB — MAGNESIUM: Magnesium: 1.5 mg/dL (ref 1.5–2.5)

## 2013-10-04 MED ORDER — INSULIN GLARGINE 100 UNIT/ML ~~LOC~~ SOLN
30.0000 [IU] | Freq: Every day | SUBCUTANEOUS | Status: DC
  Administered 2013-10-05: 30 [IU] via SUBCUTANEOUS
  Filled 2013-10-04: qty 0.3

## 2013-10-04 MED ORDER — INSULIN ASPART 100 UNIT/ML ~~LOC~~ SOLN
4.0000 [IU] | Freq: Three times a day (TID) | SUBCUTANEOUS | Status: DC
  Administered 2013-10-04 – 2013-10-05 (×2): 4 [IU] via SUBCUTANEOUS

## 2013-10-04 MED ORDER — LEVOFLOXACIN 750 MG PO TABS
750.0000 mg | ORAL_TABLET | Freq: Every day | ORAL | Status: AC
  Administered 2013-10-05: 750 mg via ORAL
  Filled 2013-10-04: qty 1

## 2013-10-04 MED ORDER — METOCLOPRAMIDE HCL 10 MG/10ML PO SOLN
10.0000 mg | Freq: Three times a day (TID) | ORAL | Status: DC | PRN
  Filled 2013-10-04: qty 10

## 2013-10-04 NOTE — Progress Notes (Signed)
   CARE MANAGEMENT NOTE 10/04/2013  Patient:  Jason [name], Jason [name]   Account Number:  [id number]  Date Initiated:  09/25/2013  Documentation initiated by:  DAVIS,TYMEEKA  Subjective/Objective Assessment:   27 yo male admitted aspiration pneumonia.     Action/Plan:   Home when stable   Anticipated DC Date:  10/05/2013   Anticipated DC Plan:  HOME/SELF CARE  In-house referral  Financial Counselor      DC Planning Services  CM consult  Medication Assistance  Indigent Health Clinic  Outpatient Services - Pt will follow up      Choice offered to / List presented to:  NA   DME arranged  NA      DME agency  NA     HH arranged  NA      HH agency  NA   Status of service:  Completed, signed off Medicare Important Message given?   (If response is "NO", the following Medicare IM given date fields will be blank) Date Medicare IM given:   Date Additional Medicare IM given:    Discharge Disposition:  HOME/SELF CARE  Per UR Regulation:  Reviewed for med. necessity/level of care/duration of stay  If discussed at Long Length of Stay Meetings, dates discussed:    Comments:  10/04/2013 1540 NCM spoke to pt and noted multiple admissions this year. Pt provided applications in the past to obtain his insulin. NCM made appt with Dental Clinic with pt have a $200 out of pocket cost with payment arrangements to be provided on previous admissions. NCM provided pt with community resources for several dental clinics in [facility]. Explained to pt it is income based clinics and a small copay may still be required. He will have to call to arrange appt. Community resources added to [employer]. Pt is not eligible for [facility] program for meds. He utilized program in April 2104. Isidoro Donning RN CCM Case Mgmt phone [phone]  [phone] Earlene Plater, RN,BSN,CCM: Case management [phone] Chart reviewed and updated.  Next chart review due on [id number]. Needs for discharge at time of review:   none extubated on [id number], still requiring 40% Fi02 for support.  [id number] Earlene Plater, RN, BSN, [place] [phone] Chart Reviewed for discharge and hospital needs.  Patient intubated due to increased hypoxia and failed bipap trial. Fi02 at 100%.  Sedated a.line inserted. Discharge needs at time of review:  None present will follow for needs. Review of patient progress due on [id number].   Sharmon Leyden, RN Registered Nurse Signed CASE MANAGEMENT Care Management Note Service date: 09/25/2013 11:02 AM Cm spoke with patient at the bedside concerning self payer status and no PCP on record. Per pt Medicaid application pending. Resides with parents whom assist with medication cost. Cm provided pt with information concerning [employer] and other community resources. No other barriers identified at this time. Financial Counselor to consult.
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
## 2013-10-05 LAB — CBC WITH DIFFERENTIAL/PLATELET
Basophils Absolute: 0 10*3/uL (ref 0.0–0.1)
Basophils Relative: 0 % (ref 0–1)
Eosinophils Absolute: 0.9 10*3/uL — ABNORMAL HIGH (ref 0.0–0.7)
HCT: 23 % — ABNORMAL LOW (ref 39.0–52.0)
Hemoglobin: 7.8 g/dL — ABNORMAL LOW (ref 13.0–17.0)
Lymphocytes Relative: 24 % (ref 12–46)
MCHC: 33.9 g/dL (ref 30.0–36.0)
MCV: 78.2 fL (ref 78.0–100.0)
Monocytes Absolute: 2.6 10*3/uL — ABNORMAL HIGH (ref 0.1–1.0)
Neutro Abs: 14.8 10*3/uL — ABNORMAL HIGH (ref 1.7–7.7)
RDW: 15.7 % — ABNORMAL HIGH (ref 11.5–15.5)
WBC: 24.1 10*3/uL — ABNORMAL HIGH (ref 4.0–10.5)

## 2013-10-05 LAB — BASIC METABOLIC PANEL
Chloride: 99 mEq/L (ref 96–112)
Creatinine, Ser: 1.2 mg/dL (ref 0.50–1.35)
GFR calc Af Amer: >90 mL/min (ref >90)
Glucose, Bld: 202 mg/dL — ABNORMAL HIGH (ref 70–99)
Potassium: 3.8 mEq/L (ref 3.5–5.1)
Sodium: 132 mEq/L — ABNORMAL LOW (ref 135–145)

## 2013-10-05 LAB — GLUCOSE, CAPILLARY: Glucose-Capillary: 244 mg/dL — ABNORMAL HIGH (ref 70–99)

## 2013-10-05 MED ORDER — INSULIN GLARGINE 100 UNIT/ML ~~LOC~~ SOLN: 30.0000 [IU] | Freq: Every day | SUBCUTANEOUS | Status: DC

## 2013-10-05 MED ORDER — INSULIN NPH ISOPHANE & REGULAR (70-30) 100 UNIT/ML ~~LOC~~ SUSP: 25.0000 [IU] | Freq: Two times a day (BID) | SUBCUTANEOUS | Status: DC

## 2013-10-05 NOTE — Progress Notes (Signed)
Discharge instructions delivered to patient.  He verbalized understanding of items related to wound care and changes in meds, insulin dosing using teach back method. Discharged per WC to car at 1300.
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
## 2013-10-05 NOTE — Care Management Note (Signed)
Cm spoke with patient at the bedside concerning discharge planning. MD order for [facility] for dressing changes and medication management. [facility] notified of new referral. Pt to follow up with Dr. Abran Cantor, family medicine, within 1 wk. NCM provided pt with information concerning dentist in the community for self payers. Pt states having transportation home. No other barriers identified at this time.     Roxy Manns Elois Averitt,MSN,RN [phone]

## 2013-10-05 NOTE — Progress Notes (Signed)
Inpatient Diabetes Program Recommendations  AACE/ADA: New Consensus Statement on Inpatient Glycemic Control (2013)  Target Ranges:  Prepandial:   less than 140 mg/dL      Peak postprandial:   less than 180 mg/dL (1-2 hours)      Critically ill patients:  140 - 180 mg/dL     **Asked to see this patient to help facilitate d/c.  Patient for d/c home today.  Has history of Type 1 DM diagnosed at age 27.  Was supposed to be taking Lantus and Novolog at home.  Does not have health insurance and was having difficulty affording insulins out of pocket.  **Patient told me he has applied for Medicaid and disability 4 separate times but has been denied.  Plans to appeal the decision again with the help of an attorney this time.  Spoke with patient about the importance of controlling his CBGs.  Reminded patient about all the devastating consequences of uncontrolled DM.  Patient told me he used to see Dr. Talmage Nap (for endocrinology) but hasn't seen her in 2 years.  Patient told me he wants to go back to see her and plans to do so even though he does not have insurance.  Noted patient does have a follow up appointment with Dr. Gershon Crane with [facility] after d/c.  **Patient told me he has Lantus and Novolog vials at home in the fridge, however, he cannot always afford those insulins.  Discussed with patient the fact that Brett Canales Minor and I conversed about this issue and that we feel patient should go home on 70/30 insulin for consistency and affordability.  Patient can purchase Reli-On brand of 70/30 insulin at [facility] for $25 per vial.  Patient agreeable and stated he would fill this Rx after d/c at [facility].  Discussed with patient how 70/30 insulin works and also reminded patient to make sure he eats a meal with this insulin.  Also discussed the timing of the insulin (patient must take with breakfast and supper).  Patient stated he understood.    **Gave patient information on purchasing an inexpensive CBG meter and  strips at [employer].  Meter is $16 and a box of 50 strips is $9.   Will follow. Ambrose Finland RN, MSN, CDE Diabetes Coordinator Inpatient Diabetes Program Team Pager: [phone] (8a-10p)

## 2013-10-07 ENCOUNTER — Telehealth: Payer: Self-pay | Admitting: Family Medicine
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
## 2013-10-09 NOTE — Telephone Encounter (Signed)
Left message on Robin's work voicemail advising of Dr. Barbie Banner orders. Asked to call back to confirm receipt of message.

## 2013-10-09 NOTE — Telephone Encounter (Signed)
Noted  

## 2013-10-12 ENCOUNTER — Telehealth: Payer: Self-pay | Admitting: Family Medicine

## 2013-10-12 NOTE — Telephone Encounter (Signed)
Home health nurse calling to inform PCP that patient has a lot of edema and blistering, pt is scheduled to come in to be seen in the office tomorrow.  Please let nurse know if she can be of any additional assistance.  Nurse also states they did not receive any specific wound care orders when pt was discharged from hospital.  Requesting to know if would care orders will come from PCP or wound center.

## 2013-10-12 NOTE — Telephone Encounter (Signed)
We will see him tomorrow and go from there

## 2013-10-13 ENCOUNTER — Ambulatory Visit (INDEPENDENT_AMBULATORY_CARE_PROVIDER_SITE_OTHER): Payer: Medicaid Other | Admitting: Family Medicine

## 2013-10-13 ENCOUNTER — Encounter: Payer: Self-pay | Admitting: Family Medicine

## 2013-10-13 VITALS — BP 104/66 | HR 85 | Temp 98.1°F | Wt 154.0 lb

## 2013-10-13 DIAGNOSIS — N179 Acute kidney failure, unspecified: Secondary | ICD-10-CM

## 2013-10-13 DIAGNOSIS — R29898 Other symptoms and signs involving the musculoskeletal system: Secondary | ICD-10-CM

## 2013-10-13 DIAGNOSIS — J690 Pneumonitis due to inhalation of food and vomit: Secondary | ICD-10-CM

## 2013-10-13 DIAGNOSIS — E10622 Type 1 diabetes mellitus with other skin ulcer: Secondary | ICD-10-CM

## 2013-10-13 DIAGNOSIS — IMO0002 Reserved for concepts with insufficient information to code with codable children: Secondary | ICD-10-CM

## 2013-10-13 DIAGNOSIS — R600 Localized edema: Secondary | ICD-10-CM

## 2013-10-13 DIAGNOSIS — B9789 Other viral agents as the cause of diseases classified elsewhere: Secondary | ICD-10-CM

## 2013-10-13 DIAGNOSIS — B348 Other viral infections of unspecified site: Secondary | ICD-10-CM

## 2013-10-13 DIAGNOSIS — R609 Edema, unspecified: Secondary | ICD-10-CM

## 2013-10-13 MED ORDER — ZOLPIDEM TARTRATE 10 MG PO TABS: 10.0000 mg | ORAL_TABLET | Freq: Every evening | ORAL | Status: DC | PRN

## 2013-10-13 MED ORDER — OXYCODONE-ACETAMINOPHEN 5-325 MG PO TABS
1.0000 | ORAL_TABLET | Freq: Four times a day (QID) | ORAL | Status: DC | PRN
Start: 2013-10-13 — End: 2013-11-09

## 2013-10-13 MED ORDER — FUROSEMIDE 40 MG PO TABS: 80.0000 mg | ORAL_TABLET | Freq: Every day | ORAL | Status: DC

## 2013-10-13 NOTE — Progress Notes (Signed)
Pre visit review using our clinic review tool, if applicable. No additional management support is needed unless otherwise documented below in the visit note. 

## 2013-10-13 NOTE — Progress Notes (Signed)
   Subjective:    Patient ID: Jason [name], male    DOB: Sep 15, 1986, 28 y.o.   MRN: [id number]  HPI Here to follow up a hospital stay from 09-23-13 to 10-05-13 for  Rhinovirus URI leading to ARDS and for aspiration pneumonia. This led to him being intubated for a few days. He was treated with IV antibiotics. He was extubated and sent home off all antibiotics. Since getting home he has had no further chest pain or cough or fever but he has had some SOB. He has had tremendous swelling in both legs and is wrapping the legs during the day, which helps a little. He takes 20 mg of Lasix daily. His renal function was stable during this admission. His appetite has improved. He describes weakness and stiffness in the legs making it very difficult to walk.    Review of Systems  Constitutional: Negative.   Respiratory: Positive for shortness of breath. Negative for cough and wheezing.   Cardiovascular: Positive for leg swelling. Negative for chest pain and palpitations.       Objective:   Physical Exam  Constitutional: He appears well-developed and well-nourished. No distress.  Cardiovascular: Normal rate, regular rhythm, normal heart sounds and intact distal pulses.   Pulmonary/Chest: Effort normal. No respiratory distress. He has no wheezes. He has no rales.  Musculoskeletal:  2+ edema to both legs down to the feet           Assessment & Plan:  We will increase the Lasix to 80 mg daily for a short time. Set up PT to help with gait and leg strength.

## 2013-10-14 ENCOUNTER — Telehealth: Payer: Self-pay

## 2013-10-14 NOTE — Telephone Encounter (Signed)
Relevant patient education assigned to patient using Emmi. ° °

## 2013-10-21 ENCOUNTER — Ambulatory Visit: Payer: Medicaid Other | Attending: Family Medicine | Admitting: Rehabilitation

## 2013-10-21 DIAGNOSIS — F17200 Nicotine dependence, unspecified, uncomplicated: Secondary | ICD-10-CM | POA: Insufficient documentation

## 2013-10-21 DIAGNOSIS — J45909 Unspecified asthma, uncomplicated: Secondary | ICD-10-CM | POA: Insufficient documentation

## 2013-10-21 DIAGNOSIS — E46 Unspecified protein-calorie malnutrition: Secondary | ICD-10-CM | POA: Insufficient documentation

## 2013-10-21 DIAGNOSIS — K219 Gastro-esophageal reflux disease without esophagitis: Secondary | ICD-10-CM | POA: Insufficient documentation

## 2013-10-21 DIAGNOSIS — E119 Type 2 diabetes mellitus without complications: Secondary | ICD-10-CM | POA: Insufficient documentation

## 2013-10-21 DIAGNOSIS — IMO0001 Reserved for inherently not codable concepts without codable children: Secondary | ICD-10-CM | POA: Insufficient documentation

## 2013-10-21 DIAGNOSIS — M6281 Muscle weakness (generalized): Secondary | ICD-10-CM | POA: Insufficient documentation

## 2013-10-22 DIAGNOSIS — E1049 Type 1 diabetes mellitus with other diabetic neurological complication: Secondary | ICD-10-CM

## 2013-10-22 DIAGNOSIS — E1142 Type 2 diabetes mellitus with diabetic polyneuropathy: Secondary | ICD-10-CM

## 2013-10-22 DIAGNOSIS — L89009 Pressure ulcer of unspecified elbow, unspecified stage: Secondary | ICD-10-CM

## 2013-10-22 DIAGNOSIS — K3184 Gastroparesis: Secondary | ICD-10-CM

## 2013-10-22 DIAGNOSIS — E1065 Type 1 diabetes mellitus with hyperglycemia: Secondary | ICD-10-CM

## 2013-10-23 ENCOUNTER — Telehealth: Payer: Self-pay | Admitting: Family Medicine
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
## 2013-10-23 NOTE — Telephone Encounter (Signed)
I spoke with pt and he is going to follow up with wound care, he was recently in the hospital and that is why he missed his appointment.

## 2013-11-06 ENCOUNTER — Encounter: Payer: Self-pay | Admitting: Family Medicine

## 2013-11-09 ENCOUNTER — Telehealth: Payer: Self-pay | Admitting: Family Medicine

## 2013-11-09 MED ORDER — OXYCODONE-ACETAMINOPHEN 5-325 MG PO TABS: 1.0000 | ORAL_TABLET | Freq: Four times a day (QID) | ORAL | Status: DC | PRN

## 2013-11-09 NOTE — Telephone Encounter (Signed)
Pt needs new rx oxycodone °

## 2013-11-09 NOTE — Telephone Encounter (Signed)
done

## 2013-11-09 NOTE — Telephone Encounter (Signed)
Script is ready for pick up and I left a voice message.  

## 2013-11-11 ENCOUNTER — Encounter (HOSPITAL_BASED_OUTPATIENT_CLINIC_OR_DEPARTMENT_OTHER): Payer: Medicaid Other | Attending: General Surgery

## 2013-11-11 DIAGNOSIS — T25239A Burn of second degree of unspecified toe(s) (nail), initial encounter: Secondary | ICD-10-CM | POA: Insufficient documentation

## 2013-11-11 DIAGNOSIS — T25329A Burn of third degree of unspecified foot, initial encounter: Secondary | ICD-10-CM | POA: Insufficient documentation

## 2013-11-11 DIAGNOSIS — X088XXA Exposure to other specified smoke, fire and flames, initial encounter: Secondary | ICD-10-CM | POA: Insufficient documentation

## 2013-11-11 DIAGNOSIS — T310 Burns involving less than 10% of body surface: Secondary | ICD-10-CM | POA: Insufficient documentation

## 2013-11-18 ENCOUNTER — Telehealth: Payer: Self-pay | Admitting: Family Medicine

## 2013-11-18 NOTE — Telephone Encounter (Signed)
Pt states he needs a recommendation letter approving his disabilities and that he has been under your care.  He has a disability hearing on Friday 11/20/13 and would like to have the letter by 11/19/13, if possible.

## 2013-11-19 NOTE — Telephone Encounter (Signed)
I spoke with pt  

## 2013-11-19 NOTE — Telephone Encounter (Signed)
This letter is ready for pick up.  

## 2013-12-22 ENCOUNTER — Emergency Department (HOSPITAL_BASED_OUTPATIENT_CLINIC_OR_DEPARTMENT_OTHER)
Admission: EM | Admit: 2013-12-22 | Discharge: 2013-12-23 | Disposition: A | Discharge status: Home / Self Care | Payer: Medicaid Other | Source: Home / Self Care | Attending: Emergency Medicine | Admitting: Emergency Medicine

## 2013-12-22 ENCOUNTER — Encounter (HOSPITAL_BASED_OUTPATIENT_CLINIC_OR_DEPARTMENT_OTHER): Payer: Self-pay | Admitting: Emergency Medicine

## 2013-12-22 DIAGNOSIS — R000 Tachycardia, unspecified: Secondary | ICD-10-CM | POA: Insufficient documentation

## 2013-12-22 DIAGNOSIS — K219 Gastro-esophageal reflux disease without esophagitis: Secondary | ICD-10-CM | POA: Insufficient documentation

## 2013-12-22 DIAGNOSIS — E1049 Type 1 diabetes mellitus with other diabetic neurological complication: Secondary | ICD-10-CM

## 2013-12-22 DIAGNOSIS — Z794 Long term (current) use of insulin: Secondary | ICD-10-CM | POA: Insufficient documentation

## 2013-12-22 DIAGNOSIS — F17200 Nicotine dependence, unspecified, uncomplicated: Secondary | ICD-10-CM | POA: Insufficient documentation

## 2013-12-22 DIAGNOSIS — K3184 Gastroparesis: Secondary | ICD-10-CM | POA: Insufficient documentation

## 2013-12-22 DIAGNOSIS — J45909 Unspecified asthma, uncomplicated: Secondary | ICD-10-CM | POA: Insufficient documentation

## 2013-12-22 DIAGNOSIS — R112 Nausea with vomiting, unspecified: Secondary | ICD-10-CM

## 2013-12-22 DIAGNOSIS — Z880 Allergy status to penicillin: Secondary | ICD-10-CM | POA: Insufficient documentation

## 2013-12-22 DIAGNOSIS — E1142 Type 2 diabetes mellitus with diabetic polyneuropathy: Secondary | ICD-10-CM | POA: Insufficient documentation

## 2013-12-22 DIAGNOSIS — Z79899 Other long term (current) drug therapy: Secondary | ICD-10-CM | POA: Insufficient documentation

## 2013-12-22 DIAGNOSIS — G40909 Epilepsy, unspecified, not intractable, without status epilepticus: Secondary | ICD-10-CM | POA: Insufficient documentation

## 2013-12-22 DIAGNOSIS — Z8614 Personal history of Methicillin resistant Staphylococcus aureus infection: Secondary | ICD-10-CM | POA: Insufficient documentation

## 2013-12-22 DIAGNOSIS — R197 Diarrhea, unspecified: Secondary | ICD-10-CM

## 2013-12-22 LAB — URINE MICROSCOPIC-ADD ON

## 2013-12-22 LAB — I-STAT VENOUS BLOOD GAS, ED
ACID-BASE EXCESS: 4 mmol/L — AB (ref 0.0–2.0)
Acid-Base Excess: 5 mmol/L — ABNORMAL HIGH (ref 0.0–2.0)
BICARBONATE: 30.4 meq/L — AB (ref 20.0–24.0)
Bicarbonate: 31.7 mEq/L — ABNORMAL HIGH (ref 20.0–24.0)
O2 SAT: 41 %
O2 Saturation: 73 %
PCO2 VEN: 22.7 mmHg — AB (ref 45.0–50.0)
PH VEN: 7.661 — AB (ref 7.250–7.300)
TCO2: 32 mmol/L (ref 0–100)
TCO2: 33 mmol/L (ref 0–100)
pCO2, Ven: 52.4 mmHg — ABNORMAL HIGH (ref 45.0–50.0)
pH, Ven: 7.37 — ABNORMAL HIGH (ref 7.250–7.300)
pO2, Ven: 40 mmHg (ref 30.0–45.0)
pO2, Ven: 6 mmHg — CL (ref 30.0–45.0)

## 2013-12-22 LAB — URINALYSIS, ROUTINE W REFLEX MICROSCOPIC
BILIRUBIN URINE: NEGATIVE
Glucose, UA: >1000 mg/dL — AB
Ketones, ur: NEGATIVE mg/dL
Leukocytes, UA: NEGATIVE
NITRITE: NEGATIVE
PH: 7 (ref 5.0–8.0)
Protein, ur: 30 mg/dL — AB
Specific Gravity, Urine: 1.026 (ref 1.005–1.030)
Urobilinogen, UA: 0.2 mg/dL (ref 0.0–1.0)

## 2013-12-22 LAB — CBC WITH DIFFERENTIAL/PLATELET
BASOS PCT: 0 % (ref 0–1)
Basophils Absolute: 0 10*3/uL (ref 0.0–0.1)
EOS PCT: 1 % (ref 0–5)
Eosinophils Absolute: 0.2 10*3/uL (ref 0.0–0.7)
HCT: 34.9 % — ABNORMAL LOW (ref 39.0–52.0)
Hemoglobin: 11.4 g/dL — ABNORMAL LOW (ref 13.0–17.0)
Lymphocytes Relative: 25 % (ref 12–46)
Lymphs Abs: 3.3 10*3/uL (ref 0.7–4.0)
MCH: 26.3 pg (ref 26.0–34.0)
MCHC: 32.7 g/dL (ref 30.0–36.0)
MCV: 80.4 fL (ref 78.0–100.0)
Monocytes Absolute: 1.7 10*3/uL — ABNORMAL HIGH (ref 0.1–1.0)
Monocytes Relative: 13 % — ABNORMAL HIGH (ref 3–12)
NEUTROS PCT: 60 % (ref 43–77)
Neutro Abs: 8 10*3/uL — ABNORMAL HIGH (ref 1.7–7.7)
Platelets: 581 10*3/uL — ABNORMAL HIGH (ref 150–400)
RBC: 4.34 MIL/uL (ref 4.22–5.81)
RDW: 14 % (ref 11.5–15.5)
WBC: 13.3 10*3/uL — ABNORMAL HIGH (ref 4.0–10.5)

## 2013-12-22 LAB — CBG MONITORING, ED
Glucose-Capillary: 570 mg/dL (ref 70–99)
Glucose-Capillary: 514 mg/dL — ABNORMAL HIGH (ref 70–99)
Glucose-Capillary: 405 mg/dL — ABNORMAL HIGH (ref 70–99)

## 2013-12-22 LAB — COMPREHENSIVE METABOLIC PANEL
ALK PHOS: 179 U/L — AB (ref 39–117)
ALT: 14 U/L (ref 0–53)
AST: 18 U/L (ref 0–37)
Albumin: 2.8 g/dL — ABNORMAL LOW (ref 3.5–5.2)
BILIRUBIN TOTAL: 0.2 mg/dL — AB (ref 0.3–1.2)
BUN: 6 mg/dL (ref 6–23)
CHLORIDE: 91 meq/L — AB (ref 96–112)
CO2: 27 meq/L (ref 19–32)
Calcium: 9 mg/dL (ref 8.4–10.5)
Creatinine, Ser: 0.7 mg/dL (ref 0.50–1.35)
GFR calc non Af Amer: >90 mL/min (ref >90)
GLUCOSE: 560 mg/dL — AB (ref 70–99)
Potassium: 4.7 mEq/L (ref 3.7–5.3)
SODIUM: 130 meq/L — AB (ref 137–147)
Total Protein: 8.4 g/dL — ABNORMAL HIGH (ref 6.0–8.3)

## 2013-12-22 LAB — LIPASE, BLOOD: Lipase: 28 U/L (ref 11–59)

## 2013-12-22 MED ORDER — SODIUM CHLORIDE 0.9 % IV BOLUS (SEPSIS)
1000.0000 mL | Freq: Once | INTRAVENOUS | Status: AC
  Administered 2013-12-22: 1000 mL via INTRAVENOUS

## 2013-12-22 MED ORDER — PANTOPRAZOLE SODIUM 40 MG IV SOLR
40.0000 mg | Freq: Once | INTRAVENOUS | Status: AC
  Administered 2013-12-22: 40 mg via INTRAVENOUS
  Filled 2013-12-22: qty 40

## 2013-12-22 MED ORDER — INSULIN REGULAR HUMAN 100 UNIT/ML IJ SOLN
10.0000 [IU] | Freq: Once | INTRAMUSCULAR | Status: AC
  Administered 2013-12-22: 100 [IU] via SUBCUTANEOUS

## 2013-12-22 MED ORDER — HYDROMORPHONE HCL PF 1 MG/ML IJ SOLN
1.0000 mg | Freq: Once | INTRAMUSCULAR | Status: AC
  Administered 2013-12-22: 1 mg via INTRAVENOUS
  Filled 2013-12-22: qty 1

## 2013-12-22 MED ORDER — INSULIN REGULAR HUMAN 100 UNIT/ML IJ SOLN
INTRAMUSCULAR | Status: AC
  Administered 2013-12-22: 23:00:00 100 [IU] via SUBCUTANEOUS
  Filled 2013-12-22: qty 1

## 2013-12-22 MED ORDER — METOCLOPRAMIDE HCL 5 MG/ML IJ SOLN
10.0000 mg | Freq: Once | INTRAMUSCULAR | Status: AC
  Administered 2013-12-22: 10 mg via INTRAVENOUS
  Filled 2013-12-22: qty 2

## 2013-12-22 MED ORDER — ONDANSETRON HCL 4 MG/2ML IJ SOLN
4.0000 mg | Freq: Once | INTRAMUSCULAR | Status: AC
  Administered 2013-12-22: 4 mg via INTRAVENOUS
  Filled 2013-12-22: qty 2

## 2013-12-22 MED ORDER — SODIUM CHLORIDE 0.9 % IV SOLN
Freq: Once | INTRAVENOUS | Status: AC
  Administered 2013-12-22: 1000 mL via INTRAVENOUS

## 2013-12-22 NOTE — Progress Notes (Signed)
VBG run at 20:59 had an incorrect temperature value.  VBG redrawn and analyzed on ISTAT at 21:17

## 2013-12-22 NOTE — ED Notes (Signed)
Abdominal pain x 2 weeks. Vomiting and diarrhea. Diabetic.

## 2013-12-22 NOTE — ED Provider Notes (Addendum)
CSN: [id number]     Arrival date & time 12/22/13  1936 History  This chart was scribed for Blanchie Dessert, MD by Maree Erie, ED Scribe. The patient was seen in room MH07/MH07. Patient's care was started at 8:21 PM.    Chief Complaint  Patient presents with  . Abdominal Pain     The history is provided by the patient. No language interpreter was used.    HPI Comments: Jason [name] is a 28 y.o. male with a history of diabetes type I, diabetic neuropathy, GERD, gastroparesis and seizures who presents to the Emergency Department complaining of constant, upper abdominal pain that began about two weeks ago. He reports associated vomiting and diarrhea. He reports 2-3 episodes of diarrhea a day. He reports similar prior pain that he attributed to episodes of gastroparesis. He denies fever, dysuria, hematuria, blood in stool, hematemesis, cough or congestion. He stats that he has not been taking his prescribed pain medication in the past few days because of side effects. He also reports elevated blood sugar since the abdominal pain began. He uses insulin injections for his diabetes. He denies alcohol use.    Past Medical History  Diagnosis Date  . Diabetes mellitus   . GERD (gastroesophageal reflux disease)   . Asthma   . Hx MRSA infection     on face  . Gastroparesis   . Diabetic neuropathy   . Seizures    Past Surgical History  Procedure Laterality Date  . Tonsillectomy     Family History  Problem Relation Age of Onset  . Diabetes Father   . Hypertension Father   . Asthma      fhx  . Hypertension      fhx  . Stroke      fhx  . Heart disease Mother    History  Substance Use Topics  . Smoking status: Current Some Day Smoker    Types: Cigars  . Smokeless tobacco: Never Used     Comment: e-cigarette  . Alcohol Use: Yes     Comment: Rarely    Review of Systems A complete 10 system review of systems was obtained and all systems are negative except as noted in the  HPI and PMH.    Allergies  Cefuroxime axetil; Penicillins; Tessalon; and Shellfish allergy  Home Medications   Current Outpatient Rx  Name  Route  Sig  Dispense  Refill  . dexlansoprazole (DEXILANT) 60 MG capsule   Oral   Take 60 mg by mouth daily.         . furosemide (LASIX) 40 MG tablet   Oral   Take 2 tablets (80 mg total) by mouth daily.   60 tablet   5   . insulin aspart (NOVOLOG) 100 UNIT/ML injection   Subcutaneous   Inject 1-17 Units into the skin 4 (four) times daily. Per sliding scale         . insulin NPH-regular (NOVOLIN 70/30) (70-30) 100 UNIT/ML injection   Subcutaneous   Inject 25 Units into the skin 2 (two) times daily with a meal.   10 mL   12   . metoCLOPramide (REGLAN) 10 MG/10ML SOLN   Oral   Take 10 mg by mouth 3 (three) times daily as needed for nausea or vomiting.         Marland Kitchen morphine (AVINZA) 60 MG 24 hr capsule   Oral   Take 60 mg by mouth daily.         Marland Kitchen  Omeprazole-Sodium Bicarbonate (ZEGERID OTC) 20-1100 MG CAPS   Oral   Take 1 capsule by mouth at bedtime.          . ondansetron (ZOFRAN) 8 MG tablet   Oral   Take 1 tablet (8 mg total) by mouth every 12 (twelve) hours as needed for nausea.   20 tablet   0   . oxyCODONE-acetaminophen (ROXICET) 5-325 MG per tablet   Oral   Take 1 tablet by mouth every 6 (six) hours as needed for severe pain.   120 tablet   0   . potassium chloride (K-DUR) 10 MEQ tablet   Oral   Take 10 mEq by mouth daily.         . pregabalin (LYRICA) 100 MG capsule   Oral   Take 100 mg by mouth 3 (three) times daily.         . sildenafil (VIAGRA) 100 MG tablet   Oral   Take 1 tablet (100 mg total) by mouth daily as needed for erectile dysfunction.   10 tablet   11   . zolpidem (AMBIEN) 10 MG tablet   Oral   Take 1 tablet (10 mg total) by mouth at bedtime as needed for sleep.   30 tablet   5    Triage Vitals: BP 121/97  Pulse 109  Temp(Src) 97.8 F (36.6 C) (Oral)  Resp 20  Ht 5\' 7"   (1.702 m)  Wt 154 lb (69.854 kg)  BMI 24.11 kg/m2  SpO2 100%  Physical Exam  Nursing note and vitals reviewed. Constitutional: He is oriented to person, place, and time. He appears well-developed and well-nourished. No distress.  HENT:  Head: Normocephalic and atraumatic.  Mouth/Throat: Mucous membranes are dry.  Eyes: EOM are normal.  Neck: Neck supple. No tracheal deviation present.  Cardiovascular: Regular rhythm.  Tachycardia present.  Exam reveals no gallop and no friction rub.   No murmur heard. Pulmonary/Chest: Effort normal and breath sounds normal. No respiratory distress. He has no wheezes. He has no rales.  Abdominal: Soft. Bowel sounds are normal. He exhibits no distension. There is tenderness in the epigastric area. There is guarding. There is no rebound.  Musculoskeletal: Normal range of motion.  Neurological: He is alert and oriented to person, place, and time.  Skin: Skin is warm and dry.  Psychiatric: He has a normal mood and affect. His behavior is normal.    ED Course  Procedures (including critical care time)  DIAGNOSTIC STUDIES: Oxygen Saturation is 100% on room air, normal by my interpretation.    COORDINATION OF CARE: 8:25 PM - Patient verbalizes understanding and agrees with treatment plan.    Labs Review Labs Reviewed  URINALYSIS, ROUTINE W REFLEX MICROSCOPIC - Abnormal; Notable for the following:    Glucose, UA >1000 (*)    Hgb urine dipstick SMALL (*)    Protein, ur 30 (*)    All other components within normal limits  COMPREHENSIVE METABOLIC PANEL - Abnormal; Notable for the following:    Sodium 130 (*)    Chloride 91 (*)    Glucose, Bld 560 (*)    Total Protein 8.4 (*)    Albumin 2.8 (*)    Alkaline Phosphatase 179 (*)    Total Bilirubin 0.2 (*)    All other components within normal limits  CBC WITH DIFFERENTIAL - Abnormal; Notable for the following:    WBC 13.3 (*)    Hemoglobin 11.4 (*)    HCT 34.9 (*)  Platelets 581 (*)    Neutro  Abs 8.0 (*)    Monocytes Relative 13 (*)    Monocytes Absolute 1.7 (*)    All other components within normal limits  CBG MONITORING, ED - Abnormal; Notable for the following:    Glucose-Capillary 570 (*)    All other components within normal limits  I-STAT VENOUS BLOOD GAS, ED - Abnormal; Notable for the following:    pH, Ven 7.661 (*)    pCO2, Ven 22.7 (*)    pO2, Ven 6.0 (*)    Bicarbonate 31.7 (*)    Acid-Base Excess 5.0 (*)    All other components within normal limits  I-STAT VENOUS BLOOD GAS, ED - Abnormal; Notable for the following:    pH, Ven 7.370 (*)    pCO2, Ven 52.4 (*)    Bicarbonate 30.4 (*)    Acid-Base Excess 4.0 (*)    All other components within normal limits  CBG MONITORING, ED - Abnormal; Notable for the following:    Glucose-Capillary 514 (*)    All other components within normal limits  CBG MONITORING, ED - Abnormal; Notable for the following:    Glucose-Capillary 405 (*)    All other components within normal limits  LIPASE, BLOOD  URINE MICROSCOPIC-ADD ON   Imaging Review No results found.   EKG Interpretation None      MDM   Final diagnoses:  Gastroparesis  Nausea vomiting and diarrhea    Patient with a significant history of type 1 diabetes who is insulin-dependent, gastroparesis, DKA AM prior renal failure presents with 2 weeks of worsening epigastric tenderness with vomiting and diarrhea that he attributes to his gastroparesis. Despite taking his home medications symptoms are not improving and he was unable to give an appointment with his physician. He denies any infectious symptoms and on abdominal exam only has epigastric tenderness. He has not had symptoms concerning for cholecystitis, appendicitis or diverticulitis. He denies any urinary symptoms but has noticed decreasing urine production. He is planning a pain medication at home states he stopped taking it because he feels is causing body aches. For the last 2 weeks his blood sugar has been  running high in the 4 to 500s despite taking his medication. Patient does not smell of ketones on exam and lower suspicion for DKA at this time. I feel the patient having a flare of his gastroparesis as well as being hyperglycemic today 570. Patient started on IV fluids and given pain and nausea control. Also given protonix since.  CBC, CMP, lipase, VBG, UA pending  9:11 PM Labs with hyperglycemia without renal involvement and no signs of pancreatitis, hepatitis.  No signs of DKA.  Will treat symptomatically and hopefully will be able to d/c home.  10:32 PM After 2L BS was 514.  Will give dose of insulin.  Pt states feeling much better and will po challenge.  11:40 PM Repeat BS 405. Pt tolerating po's and d/ced home.  I personally performed the services described in this documentation, which was scribed in my presence.  The recorded information has been reviewed and considered.    Blanchie Dessert, MD 12/22/13 [id number]  Blanchie Dessert, MD 12/22/13 2202  Blanchie Dessert, MD 12/22/13 2344

## 2013-12-23 ENCOUNTER — Telehealth: Payer: Self-pay | Admitting: Internal Medicine

## 2013-12-23 ENCOUNTER — Emergency Department (HOSPITAL_COMMUNITY)
Admission: EM | Admit: 2013-12-23 | Discharge: 2013-12-23 | Discharge status: Against Medical Advice | Payer: Medicaid Other | Source: Home / Self Care

## 2013-12-23 ENCOUNTER — Encounter (HOSPITAL_COMMUNITY): Payer: Self-pay | Admitting: Emergency Medicine

## 2013-12-23 DIAGNOSIS — J45909 Unspecified asthma, uncomplicated: Secondary | ICD-10-CM | POA: Insufficient documentation

## 2013-12-23 DIAGNOSIS — E1142 Type 2 diabetes mellitus with diabetic polyneuropathy: Secondary | ICD-10-CM | POA: Insufficient documentation

## 2013-12-23 DIAGNOSIS — F17200 Nicotine dependence, unspecified, uncomplicated: Secondary | ICD-10-CM | POA: Insufficient documentation

## 2013-12-23 DIAGNOSIS — R112 Nausea with vomiting, unspecified: Secondary | ICD-10-CM | POA: Insufficient documentation

## 2013-12-23 DIAGNOSIS — R109 Unspecified abdominal pain: Secondary | ICD-10-CM | POA: Insufficient documentation

## 2013-12-23 DIAGNOSIS — E1149 Type 2 diabetes mellitus with other diabetic neurological complication: Secondary | ICD-10-CM | POA: Insufficient documentation

## 2013-12-23 LAB — COMPREHENSIVE METABOLIC PANEL
ALK PHOS: 164 U/L — AB (ref 39–117)
ALT: 10 U/L (ref 0–53)
AST: 10 U/L (ref 0–37)
Albumin: 2.6 g/dL — ABNORMAL LOW (ref 3.5–5.2)
BUN: 4 mg/dL — ABNORMAL LOW (ref 6–23)
CO2: 24 mEq/L (ref 19–32)
Calcium: 9 mg/dL (ref 8.4–10.5)
Chloride: 97 mEq/L (ref 96–112)
Creatinine, Ser: 0.67 mg/dL (ref 0.50–1.35)
GFR calc Af Amer: >90 mL/min (ref >90)
GFR calc non Af Amer: >90 mL/min (ref >90)
Glucose, Bld: 382 mg/dL — ABNORMAL HIGH (ref 70–99)
POTASSIUM: 4.3 meq/L (ref 3.7–5.3)
SODIUM: 133 meq/L — AB (ref 137–147)
TOTAL PROTEIN: 7.8 g/dL (ref 6.0–8.3)
Total Bilirubin: 0.2 mg/dL — ABNORMAL LOW (ref 0.3–1.2)

## 2013-12-23 LAB — CBC WITH DIFFERENTIAL/PLATELET
BASOS ABS: 0 10*3/uL (ref 0.0–0.1)
Basophils Relative: 0 % (ref 0–1)
EOS ABS: 0.1 10*3/uL (ref 0.0–0.7)
Eosinophils Relative: 1 % (ref 0–5)
HCT: 31.4 % — ABNORMAL LOW (ref 39.0–52.0)
HEMOGLOBIN: 10.4 g/dL — AB (ref 13.0–17.0)
Lymphocytes Relative: 16 % (ref 12–46)
Lymphs Abs: 2.8 10*3/uL (ref 0.7–4.0)
MCH: 26.4 pg (ref 26.0–34.0)
MCHC: 33.1 g/dL (ref 30.0–36.0)
MCV: 79.7 fL (ref 78.0–100.0)
MONOS PCT: 8 % (ref 3–12)
Monocytes Absolute: 1.4 10*3/uL — ABNORMAL HIGH (ref 0.1–1.0)
NEUTROS ABS: 13.5 10*3/uL — AB (ref 1.7–7.7)
Neutrophils Relative %: 76 % (ref 43–77)
PLATELETS: 590 10*3/uL — AB (ref 150–400)
RBC: 3.94 MIL/uL — ABNORMAL LOW (ref 4.22–5.81)
RDW: 14.4 % (ref 11.5–15.5)
WBC: 17.9 10*3/uL — ABNORMAL HIGH (ref 4.0–10.5)

## 2013-12-23 LAB — LIPASE, BLOOD: Lipase: 27 U/L (ref 11–59)

## 2013-12-23 NOTE — ED Notes (Signed)
Pt c/o gastroparesis pain w/ abd pain in the center of his abd.  NV and pain x 2 wks.

## 2013-12-24 ENCOUNTER — Encounter (HOSPITAL_BASED_OUTPATIENT_CLINIC_OR_DEPARTMENT_OTHER): Payer: Self-pay | Admitting: Emergency Medicine

## 2013-12-24 ENCOUNTER — Inpatient Hospital Stay (HOSPITAL_BASED_OUTPATIENT_CLINIC_OR_DEPARTMENT_OTHER): Payer: Medicaid Other

## 2013-12-24 ENCOUNTER — Telehealth: Payer: Self-pay | Admitting: Internal Medicine

## 2013-12-24 ENCOUNTER — Inpatient Hospital Stay (HOSPITAL_BASED_OUTPATIENT_CLINIC_OR_DEPARTMENT_OTHER)
Admission: EM | Admit: 2013-12-24 | Discharge: 2013-12-25 | DRG: 639 | Disposition: A | Discharge status: Home / Self Care | Payer: Medicaid Other | Attending: Internal Medicine | Admitting: Internal Medicine

## 2013-12-24 ENCOUNTER — Telehealth: Payer: Self-pay | Admitting: Family Medicine

## 2013-12-24 DIAGNOSIS — Z8614 Personal history of Methicillin resistant Staphylococcus aureus infection: Secondary | ICD-10-CM

## 2013-12-24 DIAGNOSIS — Z880 Allergy status to penicillin: Secondary | ICD-10-CM

## 2013-12-24 DIAGNOSIS — Z823 Family history of stroke: Secondary | ICD-10-CM | POA: Diagnosis not present

## 2013-12-24 DIAGNOSIS — Z825 Family history of asthma and other chronic lower respiratory diseases: Secondary | ICD-10-CM | POA: Diagnosis not present

## 2013-12-24 DIAGNOSIS — K219 Gastro-esophageal reflux disease without esophagitis: Secondary | ICD-10-CM | POA: Diagnosis present

## 2013-12-24 DIAGNOSIS — E1010 Type 1 diabetes mellitus with ketoacidosis without coma: Secondary | ICD-10-CM | POA: Diagnosis not present

## 2013-12-24 DIAGNOSIS — Z833 Family history of diabetes mellitus: Secondary | ICD-10-CM | POA: Diagnosis not present

## 2013-12-24 DIAGNOSIS — J45909 Unspecified asthma, uncomplicated: Secondary | ICD-10-CM | POA: Diagnosis present

## 2013-12-24 DIAGNOSIS — Z9111 Patient's noncompliance with dietary regimen: Secondary | ICD-10-CM

## 2013-12-24 DIAGNOSIS — IMO0002 Reserved for concepts with insufficient information to code with codable children: Secondary | ICD-10-CM

## 2013-12-24 DIAGNOSIS — E1049 Type 1 diabetes mellitus with other diabetic neurological complication: Secondary | ICD-10-CM | POA: Diagnosis present

## 2013-12-24 DIAGNOSIS — F17200 Nicotine dependence, unspecified, uncomplicated: Secondary | ICD-10-CM | POA: Diagnosis present

## 2013-12-24 DIAGNOSIS — G8929 Other chronic pain: Secondary | ICD-10-CM

## 2013-12-24 DIAGNOSIS — Z79899 Other long term (current) drug therapy: Secondary | ICD-10-CM | POA: Diagnosis not present

## 2013-12-24 DIAGNOSIS — M545 Low back pain, unspecified: Secondary | ICD-10-CM

## 2013-12-24 DIAGNOSIS — K3184 Gastroparesis: Secondary | ICD-10-CM | POA: Diagnosis present

## 2013-12-24 DIAGNOSIS — Z9114 Patient's other noncompliance with medication regimen: Secondary | ICD-10-CM

## 2013-12-24 DIAGNOSIS — J690 Pneumonitis due to inhalation of food and vomit: Secondary | ICD-10-CM

## 2013-12-24 DIAGNOSIS — Z794 Long term (current) use of insulin: Secondary | ICD-10-CM | POA: Diagnosis not present

## 2013-12-24 DIAGNOSIS — Z91148 Patient's other noncompliance with medication regimen for other reason: Secondary | ICD-10-CM

## 2013-12-24 DIAGNOSIS — B348 Other viral infections of unspecified site: Secondary | ICD-10-CM

## 2013-12-24 DIAGNOSIS — E10622 Type 1 diabetes mellitus with other skin ulcer: Secondary | ICD-10-CM

## 2013-12-24 DIAGNOSIS — E1040 Type 1 diabetes mellitus with diabetic neuropathy, unspecified: Secondary | ICD-10-CM | POA: Diagnosis present

## 2013-12-24 DIAGNOSIS — E1149 Type 2 diabetes mellitus with other diabetic neurological complication: Secondary | ICD-10-CM

## 2013-12-24 DIAGNOSIS — E1065 Type 1 diabetes mellitus with hyperglycemia: Secondary | ICD-10-CM | POA: Diagnosis present

## 2013-12-24 DIAGNOSIS — Z720 Tobacco use: Secondary | ICD-10-CM

## 2013-12-24 DIAGNOSIS — L97909 Non-pressure chronic ulcer of unspecified part of unspecified lower leg with unspecified severity: Secondary | ICD-10-CM

## 2013-12-24 DIAGNOSIS — E1143 Type 2 diabetes mellitus with diabetic autonomic (poly)neuropathy: Secondary | ICD-10-CM | POA: Diagnosis present

## 2013-12-24 DIAGNOSIS — E1140 Type 2 diabetes mellitus with diabetic neuropathy, unspecified: Secondary | ICD-10-CM

## 2013-12-24 DIAGNOSIS — E1142 Type 2 diabetes mellitus with diabetic polyneuropathy: Secondary | ICD-10-CM | POA: Diagnosis present

## 2013-12-24 DIAGNOSIS — Z8249 Family history of ischemic heart disease and other diseases of the circulatory system: Secondary | ICD-10-CM | POA: Diagnosis not present

## 2013-12-24 DIAGNOSIS — S90129A Contusion of unspecified lesser toe(s) without damage to nail, initial encounter: Secondary | ICD-10-CM

## 2013-12-24 DIAGNOSIS — E1110 Type 2 diabetes mellitus with ketoacidosis without coma: Secondary | ICD-10-CM

## 2013-12-24 DIAGNOSIS — E43 Unspecified severe protein-calorie malnutrition: Secondary | ICD-10-CM

## 2013-12-24 DIAGNOSIS — J069 Acute upper respiratory infection, unspecified: Secondary | ICD-10-CM

## 2013-12-24 LAB — CBC WITH DIFFERENTIAL/PLATELET
Basophils Absolute: 0.1 10*3/uL (ref 0.0–0.1)
Basophils Relative: 0 % (ref 0–1)
EOS PCT: 0 % (ref 0–5)
Eosinophils Absolute: 0 10*3/uL (ref 0.0–0.7)
HEMATOCRIT: 34.4 % — AB (ref 39.0–52.0)
Hemoglobin: 11.1 g/dL — ABNORMAL LOW (ref 13.0–17.0)
LYMPHS ABS: 2.4 10*3/uL (ref 0.7–4.0)
LYMPHS PCT: 14 % (ref 12–46)
MCH: 26.4 pg (ref 26.0–34.0)
MCHC: 32.3 g/dL (ref 30.0–36.0)
MCV: 81.7 fL (ref 78.0–100.0)
MONOS PCT: 4 % (ref 3–12)
Monocytes Absolute: 0.7 10*3/uL (ref 0.1–1.0)
NEUTROS ABS: 13.7 10*3/uL — AB (ref 1.7–7.7)
Neutrophils Relative %: 81 % — ABNORMAL HIGH (ref 43–77)
Platelets: 625 10*3/uL — ABNORMAL HIGH (ref 150–400)
RBC: 4.21 MIL/uL — AB (ref 4.22–5.81)
RDW: 14.4 % (ref 11.5–15.5)
WBC: 16.9 10*3/uL — ABNORMAL HIGH (ref 4.0–10.5)

## 2013-12-24 LAB — CBC
HEMATOCRIT: 29.8 % — AB (ref 39.0–52.0)
Hemoglobin: 10 g/dL — ABNORMAL LOW (ref 13.0–17.0)
MCH: 26.6 pg (ref 26.0–34.0)
MCHC: 33.6 g/dL (ref 30.0–36.0)
MCV: 79.3 fL (ref 78.0–100.0)
PLATELETS: 573 10*3/uL — AB (ref 150–400)
RBC: 3.76 MIL/uL — ABNORMAL LOW (ref 4.22–5.81)
RDW: 14.4 % (ref 11.5–15.5)
WBC: 17.7 10*3/uL — ABNORMAL HIGH (ref 4.0–10.5)

## 2013-12-24 LAB — URINALYSIS, ROUTINE W REFLEX MICROSCOPIC
BILIRUBIN URINE: NEGATIVE
Glucose, UA: >1000 mg/dL — AB
Ketones, ur: 40 mg/dL — AB
Leukocytes, UA: NEGATIVE
Nitrite: NEGATIVE
PH: 5.5 (ref 5.0–8.0)
Protein, ur: NEGATIVE mg/dL
Specific Gravity, Urine: 1.027 (ref 1.005–1.030)
Urobilinogen, UA: 0.2 mg/dL (ref 0.0–1.0)

## 2013-12-24 LAB — GLUCOSE, CAPILLARY
GLUCOSE-CAPILLARY: 246 mg/dL — AB (ref 70–99)
Glucose-Capillary: 410 mg/dL — ABNORMAL HIGH (ref 70–99)
Glucose-Capillary: 353 mg/dL — ABNORMAL HIGH (ref 70–99)

## 2013-12-24 LAB — BASIC METABOLIC PANEL
BUN: 13 mg/dL (ref 6–23)
BUN: 12 mg/dL (ref 6–23)
BUN: 11 mg/dL (ref 6–23)
CALCIUM: 8.9 mg/dL (ref 8.4–10.5)
CALCIUM: 9.2 mg/dL (ref 8.4–10.5)
CALCIUM: 8.7 mg/dL (ref 8.4–10.5)
CHLORIDE: 94 meq/L — AB (ref 96–112)
CO2: 19 meq/L (ref 19–32)
CO2: 21 meq/L (ref 19–32)
CO2: 24 mEq/L (ref 19–32)
CREATININE: 0.78 mg/dL (ref 0.50–1.35)
Chloride: 94 mEq/L — ABNORMAL LOW (ref 96–112)
Chloride: 97 mEq/L (ref 96–112)
Creatinine, Ser: 0.8 mg/dL (ref 0.50–1.35)
Creatinine, Ser: 0.74 mg/dL (ref 0.50–1.35)
GFR calc Af Amer: >90 mL/min (ref >90)
GFR calc Af Amer: >90 mL/min (ref >90)
GFR calc non Af Amer: >90 mL/min (ref >90)
GLUCOSE: 272 mg/dL — AB (ref 70–99)
Glucose, Bld: 419 mg/dL — ABNORMAL HIGH (ref 70–99)
Glucose, Bld: 334 mg/dL — ABNORMAL HIGH (ref 70–99)
POTASSIUM: 4.1 meq/L (ref 3.7–5.3)
Potassium: 4.4 mEq/L (ref 3.7–5.3)
Potassium: 4.1 mEq/L (ref 3.7–5.3)
SODIUM: 132 meq/L — AB (ref 137–147)
Sodium: 132 mEq/L — ABNORMAL LOW (ref 137–147)
Sodium: 133 mEq/L — ABNORMAL LOW (ref 137–147)

## 2013-12-24 LAB — COMPREHENSIVE METABOLIC PANEL
ALBUMIN: 3.1 g/dL — AB (ref 3.5–5.2)
ALK PHOS: 178 U/L — AB (ref 39–117)
ALT: 13 U/L (ref 0–53)
AST: 12 U/L (ref 0–37)
BUN: 14 mg/dL (ref 6–23)
CALCIUM: 9.5 mg/dL (ref 8.4–10.5)
CO2: 18 mEq/L — ABNORMAL LOW (ref 19–32)
Chloride: 86 mEq/L — ABNORMAL LOW (ref 96–112)
Creatinine, Ser: 0.8 mg/dL (ref 0.50–1.35)
GFR calc non Af Amer: >90 mL/min (ref >90)
Glucose, Bld: 659 mg/dL (ref 70–99)
POTASSIUM: 4.7 meq/L (ref 3.7–5.3)
Sodium: 129 mEq/L — ABNORMAL LOW (ref 137–147)
TOTAL PROTEIN: 8.8 g/dL — AB (ref 6.0–8.3)
Total Bilirubin: 0.6 mg/dL (ref 0.3–1.2)

## 2013-12-24 LAB — MRSA PCR SCREENING: MRSA by PCR: NEGATIVE

## 2013-12-24 LAB — URINE MICROSCOPIC-ADD ON

## 2013-12-24 LAB — CBG MONITORING, ED: Glucose-Capillary: 517 mg/dL — ABNORMAL HIGH (ref 70–99)

## 2013-12-24 MED ORDER — SODIUM CHLORIDE 0.9 % IV SOLN
INTRAVENOUS | Status: DC
  Administered 2013-12-24: 5.4 [IU]/h via INTRAVENOUS
  Filled 2013-12-24: qty 1

## 2013-12-24 MED ORDER — SODIUM CHLORIDE 0.9 % IV SOLN
1000.0000 mL | Freq: Once | INTRAVENOUS | Status: AC
  Administered 2013-12-24: 1000 mL via INTRAVENOUS

## 2013-12-24 MED ORDER — ONDANSETRON HCL 4 MG PO TABS: 8.0000 mg | ORAL_TABLET | Freq: Two times a day (BID) | ORAL | Status: DC | PRN

## 2013-12-24 MED ORDER — HYDROMORPHONE HCL PF 1 MG/ML IJ SOLN
1.0000 mg | INTRAMUSCULAR | Status: DC | PRN
Start: 2013-12-24 — End: 2013-12-25
  Administered 2013-12-24 – 2013-12-25 (×2): 1 mg via INTRAVENOUS
  Filled 2013-12-24 (×2): qty 1

## 2013-12-24 MED ORDER — POTASSIUM CHLORIDE 10 MEQ/100ML IV SOLN
10.0000 meq | INTRAVENOUS | Status: DC
  Administered 2013-12-24 – 2013-12-25 (×4): 10 meq via INTRAVENOUS
  Filled 2013-12-24 (×3): qty 100

## 2013-12-24 MED ORDER — PREGABALIN 100 MG PO CAPS
100.0000 mg | ORAL_CAPSULE | Freq: Three times a day (TID) | ORAL | Status: DC
  Administered 2013-12-24 – 2013-12-25 (×2): 100 mg via ORAL
  Filled 2013-12-24 (×2): qty 1

## 2013-12-24 MED ORDER — HEPARIN SODIUM (PORCINE) 5000 UNIT/ML IJ SOLN
5000.0000 [IU] | Freq: Three times a day (TID) | INTRAMUSCULAR | Status: DC
  Administered 2013-12-24 – 2013-12-25 (×2): 5000 [IU] via SUBCUTANEOUS
  Filled 2013-12-24 (×5): qty 1

## 2013-12-24 MED ORDER — SODIUM CHLORIDE 0.9 % IV BOLUS (SEPSIS): 1000.0000 mL | Freq: Once | INTRAVENOUS | Status: DC

## 2013-12-24 MED ORDER — METOCLOPRAMIDE HCL 10 MG/10ML PO SOLN
10.0000 mg | Freq: Three times a day (TID) | ORAL | Status: DC | PRN
  Filled 2013-12-24: qty 10

## 2013-12-24 MED ORDER — PROMETHAZINE HCL 25 MG/ML IJ SOLN
12.5000 mg | Freq: Once | INTRAMUSCULAR | Status: AC
  Administered 2013-12-24: 12.5 mg via INTRAVENOUS
  Filled 2013-12-24: qty 1

## 2013-12-24 MED ORDER — DEXLANSOPRAZOLE 60 MG PO CPDR: 60.0000 mg | DELAYED_RELEASE_CAPSULE | Freq: Every day | ORAL | Status: DC

## 2013-12-24 MED ORDER — DEXTROSE-NACL 5-0.45 % IV SOLN: INTRAVENOUS | Status: DC

## 2013-12-24 MED ORDER — MORPHINE SULFATE 4 MG/ML IJ SOLN: 4.0000 mg | Freq: Once | INTRAMUSCULAR | Status: DC

## 2013-12-24 MED ORDER — SODIUM CHLORIDE 0.9 % IV BOLUS (SEPSIS)
1000.0000 mL | Freq: Once | INTRAVENOUS | Status: AC
  Administered 2013-12-24: 1000 mL via INTRAVENOUS

## 2013-12-24 MED ORDER — DEXTROSE 50 % IV SOLN: 25.0000 mL | INTRAVENOUS | Status: DC | PRN

## 2013-12-24 MED ORDER — INSULIN REGULAR HUMAN 100 UNIT/ML IJ SOLN
INTRAMUSCULAR | Status: AC
  Filled 2013-12-24: qty 1

## 2013-12-24 MED ORDER — HYDROMORPHONE HCL PF 1 MG/ML IJ SOLN
1.0000 mg | INTRAMUSCULAR | Status: DC | PRN
  Administered 2013-12-24: 1 mg via INTRAVENOUS
  Filled 2013-12-24: qty 1

## 2013-12-24 MED ORDER — SODIUM CHLORIDE 0.9 % IV SOLN: INTRAVENOUS | Status: DC

## 2013-12-24 MED ORDER — PANTOPRAZOLE SODIUM 40 MG PO TBEC
80.0000 mg | DELAYED_RELEASE_TABLET | Freq: Every day | ORAL | Status: DC
  Administered 2013-12-24 – 2013-12-25 (×2): 80 mg via ORAL
  Filled 2013-12-24 (×2): qty 2

## 2013-12-24 MED ORDER — SODIUM CHLORIDE 0.9 % IV SOLN
1000.0000 mL | INTRAVENOUS | Status: DC
  Administered 2013-12-24: 1000 mL via INTRAVENOUS

## 2013-12-24 MED ORDER — MORPHINE SULFATE ER 30 MG PO TBCR
60.0000 mg | EXTENDED_RELEASE_TABLET | Freq: Two times a day (BID) | ORAL | Status: DC
  Administered 2013-12-24 – 2013-12-25 (×2): 60 mg via ORAL
  Filled 2013-12-24 (×3): qty 2

## 2013-12-24 MED ORDER — DEXTROSE-NACL 5-0.45 % IV SOLN
INTRAVENOUS | Status: DC
  Administered 2013-12-24: 22:00:00 via INTRAVENOUS

## 2013-12-24 MED ORDER — POTASSIUM CHLORIDE 10 MEQ/100ML IV SOLN
10.0000 meq | INTRAVENOUS | Status: AC
  Administered 2013-12-24 (×2): 10 meq via INTRAVENOUS
  Filled 2013-12-24 (×2): qty 100

## 2013-12-24 MED ORDER — POTASSIUM CHLORIDE 10 MEQ/100ML IV SOLN
INTRAVENOUS | Status: AC
  Administered 2013-12-25: 10 meq via INTRAVENOUS
  Filled 2013-12-24: qty 100

## 2013-12-24 MED ORDER — OXYCODONE-ACETAMINOPHEN 5-325 MG PO TABS
1.0000 | ORAL_TABLET | Freq: Four times a day (QID) | ORAL | Status: DC | PRN
  Administered 2013-12-24: 1 via ORAL
  Filled 2013-12-24: qty 1

## 2013-12-24 MED ORDER — MORPHINE SULFATE 4 MG/ML IJ SOLN
4.0000 mg | Freq: Once | INTRAMUSCULAR | Status: AC
  Administered 2013-12-24: 4 mg via INTRAVENOUS
  Filled 2013-12-24: qty 1

## 2013-12-24 NOTE — ED Notes (Signed)
C/o "gastroparesis", vomiting x 2 weeks-seen here 3/17 and [facility] 3/18

## 2013-12-24 NOTE — ED Notes (Signed)
Glucose 659 per Bland Span, [facility].  EDP informed

## 2013-12-24 NOTE — ED Notes (Signed)
Pt assisted to room via wc.  Urinal provided.

## 2013-12-24 NOTE — Telephone Encounter (Addendum)
Pt states has medicaid now and wants to get his  OxyCODONE (OXYCONTIN) 12 hr tablet 15  oxyCODONE-acetaminophen (PERCOCET) 10-325 MG per tablet 1 tablet

## 2013-12-24 NOTE — H&P (Signed)
[facility] History and Physical  MARKO SKALSKI MWN:[id number] DOB: [birth year] DOA: 12/24/2013  Referring physician: EDP PCP: [name], MD   Chief Complaint: N/V   HPI: Jason [name] is a 28 y.o. male h/o DM1 who developed crampy, constant abdominal pain and vomiting.  Symptoms onset yesterday and are c/w his diabetic gastroparesis.  Because his BGL was low yesterday, he did not take his lantus.  Today his BGL was high, but because he couldn't keep anything down he didn't take any insulin.  As the day progressed he began taking insulin as his BGL continued to elevate, but unfortunately his BGL didn't respond and he began to feel worse.  He presented to the ED at [facility].  Review of Systems: Systems reviewed.  As above, otherwise negative  Past Medical History  Diagnosis Date  . Diabetes mellitus   . GERD (gastroesophageal reflux disease)   . Asthma   . Hx MRSA infection     on face  . Gastroparesis   . Diabetic neuropathy   . Seizures    Past Surgical History  Procedure Laterality Date  . Tonsillectomy     Social History:  reports that he has been smoking Cigars.  He has never used smokeless tobacco. He reports that he drinks alcohol. He reports that he does not use illicit drugs.  Allergies  Allergen Reactions  . Cefuroxime Axetil Anaphylaxis  . Penicillins Anaphylaxis    ?can take amoxicillin?  [name] [Benzonatate] Anaphylaxis  . Shellfish Allergy Itching    Family History  Problem Relation Age of Onset  . Diabetes Father   . Hypertension Father   . Asthma      fhx  . Hypertension      fhx  . Stroke      fhx  . Heart disease Mother      Prior to Admission medications   Medication Sig Start Date End Date Taking? Authorizing Provider  dexlansoprazole (DEXILANT) 60 MG capsule Take 1 capsule (60 mg total) by mouth daily. 12/24/13  Yes Jerene Bears, MD  furosemide (LASIX) 40 MG tablet Take 2 tablets (80 mg total) by mouth daily. 10/13/13  Yes  [name], MD  insulin aspart (NOVOLOG) 100 UNIT/ML injection Inject 1-17 Units into the skin 4 (four) times daily. Per sliding scale   Yes Historical Provider, MD  metoCLOPramide (REGLAN) 10 MG/10ML SOLN Take 10 mg by mouth 3 (three) times daily as needed for nausea or vomiting. 07/02/13  Yes Costin Karlyne Greenspan, MD  morphine (AVINZA) 60 MG 24 hr capsule Take 60 mg by mouth daily.   Yes Historical Provider, MD  Omeprazole-Sodium Bicarbonate (ZEGERID OTC) 20-1100 MG CAPS Take 1 capsule by mouth at bedtime.    Yes Historical Provider, MD  ondansetron (ZOFRAN) 8 MG tablet Take 1 tablet (8 mg total) by mouth every 12 (twelve) hours as needed for nausea. 07/02/13  Yes Costin Karlyne Greenspan, MD  oxyCODONE-acetaminophen (ROXICET) 5-325 MG per tablet Take 1 tablet by mouth every 6 (six) hours as needed for severe pain. 11/09/13  Yes [name], MD  pregabalin (LYRICA) 100 MG capsule Take 100 mg by mouth 3 (three) times daily. 07/02/13  Yes Costin Karlyne Greenspan, MD   Physical Exam: Filed Vitals:   12/24/13 1737  BP: 110/75  Pulse: 112  Temp:   Resp: 20    BP 110/75  Pulse 112  Temp(Src) 98.6 F (37 C) (Oral)  Resp 20  Ht 5' 7.75" (1.721 m)  SpO2 98%  General Appearance:    Alert, oriented, no distress, appears stated age  Head:    Normocephalic, atraumatic  Eyes:    PERRL, EOMI, sclera non-icteric        Nose:   Nares without drainage or epistaxis. Mucosa, turbinates normal  Throat:   Moist mucous membranes. Oropharynx without erythema or exudate.  Neck:   Supple. No carotid bruits.  No thyromegaly.  No lymphadenopathy.   Back:     No CVA tenderness, no spinal tenderness  Lungs:     Clear to auscultation bilaterally, without wheezes, rhonchi or rales  Chest wall:    No tenderness to palpitation  Heart:    Regular rate and rhythm without murmurs, gallops, rubs  Abdomen:     Soft, non-tender, nondistended, normal bowel sounds, no organomegaly  Genitalia:    deferred  Rectal:    deferred   Extremities:   No clubbing, cyanosis or edema.  Pulses:   2+ and symmetric all extremities  Skin:   Skin color, texture, turgor normal, no rashes or lesions  Lymph nodes:   Cervical, supraclavicular, and axillary nodes normal  Neurologic:   CNII-XII intact. Normal strength, sensation and reflexes      throughout    Labs on Admission:  Basic Metabolic Panel:  Recent Labs Lab 12/22/13 2026 12/23/13 1916 12/24/13 1520  NA 130* 133* 129*  K 4.7 4.3 4.7  CL 91* 97 86*  CO2 27 24 18*  GLUCOSE 560* 382* 659*  BUN 6 4* 14  CREATININE 0.70 0.67 0.80  CALCIUM 9.0 9.0 9.5   Liver Function Tests:  Recent Labs Lab 12/22/13 2026 12/23/13 1916 12/24/13 1520  AST 18 10 12   ALT 14 10 13   ALKPHOS 179* 164* 178*  BILITOT 0.2* 0.2* 0.6  PROT 8.4* 7.8 8.8*  ALBUMIN 2.8* 2.6* 3.1*    Recent Labs Lab 12/22/13 2026 12/23/13 1916  LIPASE 28 27   No results found for this basename: AMMONIA,  in the last 168 hours CBC:  Recent Labs Lab 12/22/13 2026 12/23/13 1916 12/24/13 1520  WBC 13.3* 17.9* 16.9*  NEUTROABS 8.0* 13.5* 13.7*  HGB 11.4* 10.4* 11.1*  HCT 34.9* 31.4* 34.4*  MCV 80.4 79.7 81.7  PLT 581* 590* 625*   Cardiac Enzymes: No results found for this basename: CKTOTAL, CKMB, CKMBINDEX, TROPONINI,  in the last 168 hours  BNP (last 3 results)  Recent Labs  09/25/13 2005 09/28/13 0430 10/02/13 0445  PROBNP 1195.0* 673.0* 2451.0*   CBG:  Recent Labs Lab 12/22/13 1959 12/22/13 2158 12/22/13 2322 12/24/13 1758 12/24/13 1914  GLUCAP 570* 514* 405* 517* 410*    Radiological Exams on Admission: No results found.  EKG: Independently reviewed.  Assessment/Plan Principal Problem:   DKA (diabetic ketoacidoses) Active Problems:   Type 1 diabetes, uncontrolled, with neuropathy   Gastroparesis due to DM   1. DKA - likely due to missing lantus yesterday, tried to explain need for basal to patient who has DM1 at baseline and even if not eating will go into  DKA without insulin.  Patient now on DKA pathway. 2. Gastroparesis - continue home meds, PRN dilaudid for pain    Code Status: Full Code  Family Communication: No family in room Disposition Plan: Admit to inpatient   Time spent: 50 min  GARDNER, JARED M. [facility] Pager [phone]  If 7AM-7PM, please contact the day team taking care of the patient [email or web address] Password [facility] 12/24/2013, 7:44 PM

## 2013-12-24 NOTE — Telephone Encounter (Signed)
Sent Dexilant to pt's pharmacy

## 2013-12-24 NOTE — Telephone Encounter (Signed)
Patient was seen in the ER at [facility] for hyperglycemia, nausea and vomiting.  On admission glucose was 570.  Discharge glucose was 405, no evidence of DKA.  He has reported that BS have been 4-500s for the last 2 weeks with nausea and vomiting.  He is advised that he needs to go to Dr. Sarajane Jews and get his BS under control.  I have asked that he contact Dr. Sarajane Jews today for an appointment and follow up from the ER on 3/18.   He is asking for reglan rx that was prescribed last fall.  He is not able to take domperidone due to cost.  Dr. Hilarie Fredrickson do you want me to send the rx for reglan.  He apparently has not been on any meds including his insulin for some time.  He reports he now has medicaid and can afford it .  Please advise

## 2013-12-24 NOTE — ED Provider Notes (Signed)
CSN: [id number]     Arrival date & time 12/24/13  1429 History   First MD Initiated Contact with Patient 12/24/13 1450     Chief Complaint  Patient presents with  . Emesis     (Consider location/radiation/quality/duration/timing/severity/associated sxs/prior Treatment) HPI Comments: Patient is a 28 year-old male who presents with complaints of crampy, constant abdominal pain, vomiting without blood, and diabetic neuropathic pain.  He has been vomiting about every 2 hours and after attempting to eat or drink. He states his pain is 8/10. He has tried water, Pedialyte and Gatorade. He has been unable to take his medications today because of the persistent vomiting.  Patient is a 28 y.o. male presenting with vomiting. The history is provided by the patient. No language interpreter was used.  Emesis Timing:  Intermittent Progression:  Unchanged Recent urination:  Decreased Relieved by:  Nothing Worsened by:  Nothing tried Associated symptoms: abdominal pain and diarrhea   Associated symptoms: no chills, no cough, no headaches and no sore throat   Abdominal pain:    Location:  Generalized   Quality:  Cramping   Severity:  Severe   Timing:  Constant   Progression:  Unchanged Diarrhea:    Progression:  Unchanged Risk factors: diabetes     Past Medical History  Diagnosis Date  . Diabetes mellitus   . GERD (gastroesophageal reflux disease)   . Asthma   . Hx MRSA infection     on face  . Gastroparesis   . Diabetic neuropathy   . Seizures    Past Surgical History  Procedure Laterality Date  . Tonsillectomy     Family History  Problem Relation Age of Onset  . Diabetes Father   . Hypertension Father   . Asthma      fhx  . Hypertension      fhx  . Stroke      fhx  . Heart disease Mother    History  Substance Use Topics  . Smoking status: Current Some Day Smoker    Types: Cigars  . Smokeless tobacco: Never Used     Comment: e-cigarette  . Alcohol Use: Yes    Review  of Systems  Constitutional: Positive for appetite change. Negative for fever and chills.  HENT: Positive for rhinorrhea. Negative for congestion, ear pain, hearing loss and sore throat.   Eyes: Negative for visual disturbance.  Respiratory: Positive for shortness of breath. Negative for cough.        Shortness of breath while walking is baseline for him since December 2014.  Cardiovascular: Negative for chest pain.  Gastrointestinal: Positive for nausea, vomiting, abdominal pain and diarrhea. Negative for constipation and blood in stool.  Genitourinary: Positive for decreased urine volume.  Skin: Positive for wound.       Patient has diabetic ulcer on left elbow, right shin, left shin, and medial aspect of left calf.   Neurological: Positive for light-headedness. Negative for headaches.       Allergies  Cefuroxime axetil; Penicillins; Tessalon; and Shellfish allergy  Home Medications   Current Outpatient Rx  Name  Route  Sig  Dispense  Refill  . dexlansoprazole (DEXILANT) 60 MG capsule   Oral   Take 60 mg by mouth daily.         . furosemide (LASIX) 40 MG tablet   Oral   Take 2 tablets (80 mg total) by mouth daily.   60 tablet   5   . insulin aspart (NOVOLOG) 100 UNIT/ML injection  Subcutaneous   Inject 1-17 Units into the skin 4 (four) times daily. Per sliding scale         . insulin NPH-regular (NOVOLIN 70/30) (70-30) 100 UNIT/ML injection   Subcutaneous   Inject 25 Units into the skin 2 (two) times daily with a meal.   10 mL   12   . metoCLOPramide (REGLAN) 10 MG/10ML SOLN   Oral   Take 10 mg by mouth 3 (three) times daily as needed for nausea or vomiting.         Marland Kitchen morphine (AVINZA) 60 MG 24 hr capsule   Oral   Take 60 mg by mouth daily.         Earney Navy Bicarbonate (ZEGERID OTC) 20-1100 MG CAPS   Oral   Take 1 capsule by mouth at bedtime.          . ondansetron (ZOFRAN) 8 MG tablet   Oral   Take 1 tablet (8 mg total) by mouth every  12 (twelve) hours as needed for nausea.   20 tablet   0   . oxyCODONE-acetaminophen (ROXICET) 5-325 MG per tablet   Oral   Take 1 tablet by mouth every 6 (six) hours as needed for severe pain.   120 tablet   0   . potassium chloride (K-DUR) 10 MEQ tablet   Oral   Take 10 mEq by mouth daily.         . pregabalin (LYRICA) 100 MG capsule   Oral   Take 100 mg by mouth 3 (three) times daily.         . sildenafil (VIAGRA) 100 MG tablet   Oral   Take 1 tablet (100 mg total) by mouth daily as needed for erectile dysfunction.   10 tablet   11   . zolpidem (AMBIEN) 10 MG tablet   Oral   Take 1 tablet (10 mg total) by mouth at bedtime as needed for sleep.   30 tablet   5    BP 120/83  Pulse 107  Temp(Src) 98.6 F (37 C) (Oral)  Resp 16  Ht 5' 7.75" (1.721 m)  SpO2 100% Physical Exam  Constitutional: He is oriented to person, place, and time. He appears well-developed and well-nourished.  Ill appearing but non-toxic.  HENT:  Head: Normocephalic.  Mouth/Throat: Mucous membranes are not dry.  Eyes: Conjunctivae are normal.  Neck: Normal range of motion.  Cardiovascular: Regular rhythm.  Tachycardia present.   No murmur heard. Pulmonary/Chest: Effort normal and breath sounds normal. Not tachypneic and not bradypneic. He has no wheezes. He has no rales.  Abdominal: Soft. There is generalized tenderness. There is guarding.  Musculoskeletal: Normal range of motion.  Neurological: He is alert and oriented to person, place, and time.  Skin: Skin is warm and dry.  Psychiatric: He has a normal mood and affect.    ED Course  Procedures (including critical care time) Labs Review Labs Reviewed  URINALYSIS, ROUTINE W REFLEX MICROSCOPIC   Results for orders placed during the hospital encounter of 12/24/13  MRSA PCR SCREENING      Result Value Ref Range   MRSA by PCR NEGATIVE  NEGATIVE  URINALYSIS, ROUTINE W REFLEX MICROSCOPIC      Result Value Ref Range   Color, Urine  YELLOW  YELLOW   APPearance CLEAR  CLEAR   Specific Gravity, Urine 1.027  1.005 - 1.030   pH 5.5  5.0 - 8.0   Glucose, UA >1000 (*) NEGATIVE mg/dL  Hgb urine dipstick SMALL (*) NEGATIVE   Bilirubin Urine NEGATIVE  NEGATIVE   Ketones, ur 40 (*) NEGATIVE mg/dL   Protein, ur NEGATIVE  NEGATIVE mg/dL   Urobilinogen, UA 0.2  0.0 - 1.0 mg/dL   Nitrite NEGATIVE  NEGATIVE   Leukocytes, UA NEGATIVE  NEGATIVE  URINE MICROSCOPIC-ADD ON      Result Value Ref Range   Squamous Epithelial / LPF FEW (*) RARE   WBC, UA 3-6  <3 WBC/hpf   RBC / HPF 3-6  <3 RBC/hpf   Bacteria, UA RARE  RARE  COMPREHENSIVE METABOLIC PANEL      Result Value Ref Range   Sodium 129 (*) 137 - 147 mEq/L   Potassium 4.7  3.7 - 5.3 mEq/L   Chloride 86 (*) 96 - 112 mEq/L   CO2 18 (*) 19 - 32 mEq/L   Glucose, Bld 659 (*) 70 - 99 mg/dL   BUN 14  6 - 23 mg/dL   Creatinine, Ser 0.80  0.50 - 1.35 mg/dL   Calcium 9.5  8.4 - 10.5 mg/dL   Total Protein 8.8 (*) 6.0 - 8.3 g/dL   Albumin 3.1 (*) 3.5 - 5.2 g/dL   AST 12  0 - 37 U/L   ALT 13  0 - 53 U/L   Alkaline Phosphatase 178 (*) 39 - 117 U/L   Total Bilirubin 0.6  0.3 - 1.2 mg/dL   GFR calc non Af Amer >90  >90 mL/min   GFR calc Af Amer >90  >90 mL/min  CBC WITH DIFFERENTIAL      Result Value Ref Range   WBC 16.9 (*) 4.0 - 10.5 K/uL   RBC 4.21 (*) 4.22 - 5.81 MIL/uL   Hemoglobin 11.1 (*) 13.0 - 17.0 g/dL   HCT 34.4 (*) 39.0 - 52.0 %   MCV 81.7  78.0 - 100.0 fL   MCH 26.4  26.0 - 34.0 pg   MCHC 32.3  30.0 - 36.0 g/dL   RDW 14.4  11.5 - 15.5 %   Platelets 625 (*) 150 - 400 K/uL   Neutrophils Relative % 81 (*) 43 - 77 %   Neutro Abs 13.7 (*) 1.7 - 7.7 K/uL   Lymphocytes Relative 14  12 - 46 %   Lymphs Abs 2.4  0.7 - 4.0 K/uL   Monocytes Relative 4  3 - 12 %   Monocytes Absolute 0.7  0.1 - 1.0 K/uL   Eosinophils Relative 0  0 - 5 %   Eosinophils Absolute 0.0  0.0 - 0.7 K/uL   Basophils Relative 0  0 - 1 %   Basophils Absolute 0.1  0.0 - 0.1 K/uL  GLUCOSE, CAPILLARY       Result Value Ref Range   Glucose-Capillary 410 (*) 70 - 99 mg/dL   Comment 1 Documented in Chart     Comment 2 Notify RN     Comment 3 Glucose Stabilizer    BASIC METABOLIC PANEL      Result Value Ref Range   Sodium 132 (*) 137 - 147 mEq/L   Potassium 4.4  3.7 - 5.3 mEq/L   Chloride 94 (*) 96 - 112 mEq/L   CO2 19  19 - 32 mEq/L   Glucose, Bld 419 (*) 70 - 99 mg/dL   BUN 13  6 - 23 mg/dL   Creatinine, Ser 0.80  0.50 - 1.35 mg/dL   Calcium 8.9  8.4 - 10.5 mg/dL   GFR calc non Af Amer >90  >  90 mL/min   GFR calc Af Amer >90  >90 mL/min  BASIC METABOLIC PANEL      Result Value Ref Range   Sodium 132 (*) 137 - 147 mEq/L   Potassium 4.1  3.7 - 5.3 mEq/L   Chloride 94 (*) 96 - 112 mEq/L   CO2 21  19 - 32 mEq/L   Glucose, Bld 334 (*) 70 - 99 mg/dL   BUN 12  6 - 23 mg/dL   Creatinine, Ser 0.78  0.50 - 1.35 mg/dL   Calcium 9.2  8.4 - 10.5 mg/dL   GFR calc non Af Amer >90  >90 mL/min   GFR calc Af Amer >90  >90 mL/min  BASIC METABOLIC PANEL      Result Value Ref Range   Sodium 133 (*) 137 - 147 mEq/L   Potassium 4.1  3.7 - 5.3 mEq/L   Chloride 97  96 - 112 mEq/L   CO2 24  19 - 32 mEq/L   Glucose, Bld 272 (*) 70 - 99 mg/dL   BUN 11  6 - 23 mg/dL   Creatinine, Ser 0.74  0.50 - 1.35 mg/dL   Calcium 8.7  8.4 - 10.5 mg/dL   GFR calc non Af Amer >90  >90 mL/min   GFR calc Af Amer >90  >90 mL/min  BASIC METABOLIC PANEL      Result Value Ref Range   Sodium 133 (*) 137 - 147 mEq/L   Potassium 4.3  3.7 - 5.3 mEq/L   Chloride 98  96 - 112 mEq/L   CO2 24  19 - 32 mEq/L   Glucose, Bld 209 (*) 70 - 99 mg/dL   BUN 11  6 - 23 mg/dL   Creatinine, Ser 0.75  0.50 - 1.35 mg/dL   Calcium 8.7  8.4 - 10.5 mg/dL   GFR calc non Af Amer >90  >90 mL/min   GFR calc Af Amer >90  >90 mL/min  CBC      Result Value Ref Range   WBC 17.7 (*) 4.0 - 10.5 K/uL   RBC 3.76 (*) 4.22 - 5.81 MIL/uL   Hemoglobin 10.0 (*) 13.0 - 17.0 g/dL   HCT 29.8 (*) 39.0 - 52.0 %   MCV 79.3  78.0 - 100.0 fL   MCH 26.6   26.0 - 34.0 pg   MCHC 33.6  30.0 - 36.0 g/dL   RDW 14.4  11.5 - 15.5 %   Platelets 573 (*) 150 - 400 K/uL  GLUCOSE, CAPILLARY      Result Value Ref Range   Glucose-Capillary 353 (*) 70 - 99 mg/dL  GLUCOSE, CAPILLARY      Result Value Ref Range   Glucose-Capillary 246 (*) 70 - 99 mg/dL  GLUCOSE, CAPILLARY      Result Value Ref Range   Glucose-Capillary 184 (*) 70 - 99 mg/dL  GLUCOSE, CAPILLARY      Result Value Ref Range   Glucose-Capillary 133 (*) 70 - 99 mg/dL  BASIC METABOLIC PANEL      Result Value Ref Range   Sodium 135 (*) 137 - 147 mEq/L   Potassium 4.4  3.7 - 5.3 mEq/L   Chloride 99  96 - 112 mEq/L   CO2 24  19 - 32 mEq/L   Glucose, Bld 79  70 - 99 mg/dL   BUN 9  6 - 23 mg/dL   Creatinine, Ser 0.71  0.50 - 1.35 mg/dL   Calcium 8.8  8.4 - 10.5 mg/dL  GFR calc non Af Amer >90  >90 mL/min   GFR calc Af Amer >90  >90 mL/min  GLUCOSE, CAPILLARY      Result Value Ref Range   Glucose-Capillary 93  70 - 99 mg/dL  GLUCOSE, CAPILLARY      Result Value Ref Range   Glucose-Capillary 255 (*) 70 - 99 mg/dL  GLUCOSE, CAPILLARY      Result Value Ref Range   Glucose-Capillary 80  70 - 99 mg/dL  GLUCOSE, CAPILLARY      Result Value Ref Range   Glucose-Capillary 78  70 - 99 mg/dL  GLUCOSE, CAPILLARY      Result Value Ref Range   Glucose-Capillary 82  70 - 99 mg/dL   Comment 1 Notify RN    GLUCOSE, CAPILLARY      Result Value Ref Range   Glucose-Capillary 131 (*) 70 - 99 mg/dL  CBG MONITORING, ED      Result Value Ref Range   Glucose-Capillary 517 (*) 70 - 99 mg/dL   CRITICAL CARE Performed by: Charlann Lange A   Total critical care time: 30  Critical care time was exclusive of separately billable procedures and treating other patients.  Critical care was necessary to treat or prevent imminent or life-threatening deterioration.  Critical care was time spent personally by me on the following activities: development of treatment plan with patient and/or surrogate as  well as nursing, discussions with consultants, evaluation of patient's response to treatment, examination of patient, obtaining history from patient or surrogate, ordering and performing treatments and interventions, ordering and review of laboratory studies, ordering and review of radiographic studies, pulse oximetry and re-evaluation of patient's condition.  Imaging Review No results found.   EKG Interpretation None      MDM   Final diagnoses:  None    1. DKA  Patient found to be acidotic (anion gap 25), markedly hyperglycemic, c/w diabetic ketoacidosis. Glucostabilizer started, Boluses of IV fluids started, second IV placed. Discussed with hospitalist for admission, accepted to [facility] at patient's request. Vital signs stable. Patient appears more comfortable on re-examination.   Dewaine Oats, PA-C 12/27/13 2318

## 2013-12-25 ENCOUNTER — Encounter (HOSPITAL_COMMUNITY): Payer: Self-pay

## 2013-12-25 DIAGNOSIS — E1049 Type 1 diabetes mellitus with other diabetic neurological complication: Secondary | ICD-10-CM

## 2013-12-25 DIAGNOSIS — E1110 Type 2 diabetes mellitus with ketoacidosis without coma: Secondary | ICD-10-CM

## 2013-12-25 DIAGNOSIS — E1142 Type 2 diabetes mellitus with diabetic polyneuropathy: Secondary | ICD-10-CM

## 2013-12-25 DIAGNOSIS — E1065 Type 1 diabetes mellitus with hyperglycemia: Secondary | ICD-10-CM

## 2013-12-25 LAB — GLUCOSE, CAPILLARY
GLUCOSE-CAPILLARY: 131 mg/dL — AB (ref 70–99)
Glucose-Capillary: 255 mg/dL — ABNORMAL HIGH (ref 70–99)
Glucose-Capillary: 184 mg/dL — ABNORMAL HIGH (ref 70–99)
Glucose-Capillary: 133 mg/dL — ABNORMAL HIGH (ref 70–99)
Glucose-Capillary: 93 mg/dL (ref 70–99)
Glucose-Capillary: 78 mg/dL (ref 70–99)
Glucose-Capillary: 80 mg/dL (ref 70–99)
Glucose-Capillary: 82 mg/dL (ref 70–99)

## 2013-12-25 LAB — BASIC METABOLIC PANEL
BUN: 11 mg/dL (ref 6–23)
BUN: 9 mg/dL (ref 6–23)
CO2: 24 mEq/L (ref 19–32)
CO2: 24 mEq/L (ref 19–32)
CREATININE: 0.75 mg/dL (ref 0.50–1.35)
CREATININE: 0.71 mg/dL (ref 0.50–1.35)
Calcium: 8.7 mg/dL (ref 8.4–10.5)
Calcium: 8.8 mg/dL (ref 8.4–10.5)
Chloride: 98 mEq/L (ref 96–112)
Chloride: 99 mEq/L (ref 96–112)
GFR calc non Af Amer: >90 mL/min (ref >90)
Glucose, Bld: 209 mg/dL — ABNORMAL HIGH (ref 70–99)
Glucose, Bld: 79 mg/dL (ref 70–99)
Potassium: 4.3 mEq/L (ref 3.7–5.3)
Potassium: 4.4 mEq/L (ref 3.7–5.3)
Sodium: 133 mEq/L — ABNORMAL LOW (ref 137–147)
Sodium: 135 mEq/L — ABNORMAL LOW (ref 137–147)

## 2013-12-25 MED ORDER — INSULIN GLARGINE 100 UNIT/ML ~~LOC~~ SOLN: 20.0000 [IU] | Freq: Every day | SUBCUTANEOUS | Status: DC

## 2013-12-25 MED ORDER — INSULIN GLARGINE 100 UNIT/ML ~~LOC~~ SOLN
10.0000 [IU] | Freq: Every day | SUBCUTANEOUS | Status: DC
  Administered 2013-12-25: 10 [IU] via SUBCUTANEOUS
  Filled 2013-12-25: qty 0.1

## 2013-12-25 MED ORDER — INSULIN ASPART 100 UNIT/ML ~~LOC~~ SOLN: 1.0000 [IU] | Freq: Four times a day (QID) | SUBCUTANEOUS | Status: DC

## 2013-12-25 MED ORDER — INSULIN GLARGINE 100 UNIT/ML ~~LOC~~ SOLN: 15.0000 [IU] | Freq: Every day | SUBCUTANEOUS | Status: DC

## 2013-12-25 MED ORDER — DOXYCYCLINE HYCLATE 100 MG PO TABS: 100.0000 mg | ORAL_TABLET | Freq: Two times a day (BID) | ORAL | Status: DC

## 2013-12-25 MED ORDER — DOXYCYCLINE HYCLATE 100 MG PO TABS
100.0000 mg | ORAL_TABLET | Freq: Two times a day (BID) | ORAL | Status: DC
  Administered 2013-12-25: 100 mg via ORAL
  Filled 2013-12-25 (×2): qty 1

## 2013-12-25 MED ORDER — INSULIN ASPART 100 UNIT/ML ~~LOC~~ SOLN
3.0000 [IU] | Freq: Once | SUBCUTANEOUS | Status: DC
  Administered 2013-12-25: 3 [IU] via SUBCUTANEOUS

## 2013-12-25 MED ORDER — SILVER SULFADIAZINE 1 % EX CREA
TOPICAL_CREAM | Freq: Every day | CUTANEOUS | Status: DC
Start: 2013-12-25 — End: 2013-12-25
  Administered 2013-12-25: 10:00:00 via TOPICAL
  Filled 2013-12-25: qty 50

## 2013-12-25 MED ORDER — INSULIN ASPART 100 UNIT/ML ~~LOC~~ SOLN
0.0000 [IU] | Freq: Three times a day (TID) | SUBCUTANEOUS | Status: DC
  Administered 2013-12-25: 09:00:00 via SUBCUTANEOUS

## 2013-12-25 MED ORDER — SILVER SULFADIAZINE 1 % EX CREA: TOPICAL_CREAM | Freq: Every day | CUTANEOUS | Status: DC

## 2013-12-25 NOTE — Telephone Encounter (Signed)
See my note

## 2013-12-25 NOTE — Telephone Encounter (Signed)
Then ask his DC doctor to write him for some. He couldn't pick it up from here until next week anyway.

## 2013-12-25 NOTE — Telephone Encounter (Signed)
I see he is an inpatient now. We will handle this later

## 2013-12-25 NOTE — Progress Notes (Signed)
Inpatient Diabetes Program Recommendations  AACE/ADA: New Consensus Statement on Inpatient Glycemic Control (2013)  Target Ranges:  Prepandial:   less than 140 mg/dL      Peak postprandial:   less than 180 mg/dL (1-2 hours)      Critically ill patients:  140 - 180 mg/dL     **Admitted with DKA.  Per notes, pt had abdominal pain consistent with his gastroparesis.  Did not take Lantus insulin b/c he was not eating.  As a result, pt's CBGs became elevated and pt wound up in ED with Glucose level of 659 mg/dl.  **I am very familiar with this patient, as I spent a considerable amount of time counseling this patient on the importance of good glucose control back in December (10/05/13).  Pt does not have insurance and told me back in December that he had Lantus and Novolog insulin at home.  I thought the plan was to send pt home on 70/30 insulin for cost considerations, however, it appears pt was d/c'd home on Lantus and Novolog instead.  A1c back in December was 13.3%.  Diabetes history: Type 1 Outpatient meds: Lantus 30 units daily + Novolog 1-17 units tid per SSI regimen Current orders: Lantus 15 units daily + Novolog Sensitive SSI   **Note pt was transitioned off the IV insulin drip this morning.  Was given 10 units Lantus at 0632am.  Scheduled to get 15 units Lantus tomorrow.  **Note pt was started on Carb Mod diet today.  Will likely need scheduled Novolog meal coverage if PO intake is adequate.   Will follow. Wyn Quaker RN, MSN, CDE Diabetes Coordinator Inpatient Diabetes Program Team Pager: [phone] (8a-10p)

## 2013-12-25 NOTE — Telephone Encounter (Signed)
Pt's has diabetic gastroparesis and very poor glucose control, in the past secondary to medical non-adherence. He should follow-up in the office.  I am okay with refill of metoclopramide 10mg  TID-AC and HS, but only until follow-up.  Further refills will need to be after he has re-established care with [name]. Gastroparesis diet very very important for him

## 2013-12-25 NOTE — Telephone Encounter (Signed)
Hospital MD at d/c and PCP can provide medication until he sees [name]
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
## 2013-12-25 NOTE — Discharge Summary (Signed)
Physician Discharge Summary  Jason [name] SAY:[id number] DOB: April 14, 1986 DOA: 12/24/2013  PCP: [name], MD  Admit date: 12/24/2013 Discharge date: 12/25/2013  Time spent: >35 minutes  Recommendations for Outpatient Follow-up:  F/u with PCP in 1 week Discharge Diagnoses:  Principal Problem:   DKA (diabetic ketoacidoses) Active Problems:   Type 1 diabetes, uncontrolled, with neuropathy   Gastroparesis due to DM   Discharge Condition: stable   Diet recommendation: DM  Filed Weights   12/24/13 2000 12/25/13 0300  Weight: 57.3 kg (126 lb 5.2 oz) 57.6 kg (126 lb 15.8 oz)    History of present illness:  28 y.o. male h/o DM1 who developed crampy, constant abdominal pain and vomiting is admitted with DKA; he was not taking his insulin as prescribed    Hospital Course:  1. DKA resolved on IVF, IV insulin; tolerated diet well  -per last HA1C DM is still uncontrolled; he is recommended to increase lantus 15-->20+ ISS; titrate per response as outpatient   Procedures:  none (i.e. Studies not automatically included, echos, thoracentesis, etc; not x-rays)  Consultations:  none  Discharge Exam: Filed Vitals:   12/25/13 0800  BP: 133/97  Pulse:   Temp:   Resp: 11    General: alert Cardiovascular: s1,s2 rrr Respiratory: CTA BL  Discharge Instructions  Discharge Orders   Future Orders Complete By Expires   Diet - low sodium heart healthy  As directed    Discharge instructions  As directed    Comments:     Please follow up with primary care in 1 week   Increase activity slowly  As directed        Medication List    STOP taking these medications       furosemide 40 MG tablet  Commonly known as:  LASIX      TAKE these medications       dexlansoprazole 60 MG capsule  Commonly known as:  DEXILANT  Take 1 capsule (60 mg total) by mouth daily.     doxycycline 100 MG tablet  Commonly known as:  VIBRA-TABS  Take 1 tablet (100 mg total) by mouth every 12  (twelve) hours.     insulin aspart 100 UNIT/ML injection  Commonly known as:  novoLOG  Inject 1-17 Units into the skin 4 (four) times daily. Per sliding scale     insulin glargine 100 UNIT/ML injection  Commonly known as:  LANTUS  Inject 0.2 mLs (20 Units total) into the skin daily.  Start taking on:  12/26/2013     metoCLOPramide 10 MG/10ML Soln  Commonly known as:  REGLAN  Take 10 mg by mouth 3 (three) times daily as needed for nausea or vomiting.     morphine 60 MG 24 hr capsule  Commonly known as:  AVINZA  Take 60 mg by mouth daily.     ondansetron 8 MG tablet  Commonly known as:  ZOFRAN  Take 1 tablet (8 mg total) by mouth every 12 (twelve) hours as needed for nausea.     oxyCODONE-acetaminophen 5-325 MG per tablet  Commonly known as:  ROXICET  Take 1 tablet by mouth every 6 (six) hours as needed for severe pain.     pregabalin 100 MG capsule  Commonly known as:  LYRICA  Take 100 mg by mouth 3 (three) times daily.     silver sulfADIAZINE 1 % cream  Commonly known as:  SILVADENE  Apply topically daily.     ZEGERID OTC 20-1100 MG Caps capsule  Generic drug:  Omeprazole-Sodium Bicarbonate  Take 1 capsule by mouth at bedtime.       Allergies  Allergen Reactions  . Cefuroxime Axetil Anaphylaxis  . Penicillins Anaphylaxis    ?can take amoxicillin?  [name] [Benzonatate] Anaphylaxis  . Shellfish Allergy Itching       Follow-up Information   Follow up with FRY,STEPHEN A, MD In 2 days.   Specialty:  Family Medicine   Contact information:   [place] [address] [phone]        The results of significant diagnostics from this hospitalization (including imaging, microbiology, ancillary and laboratory) are listed below for reference.    Significant Diagnostic Studies: No results found.  Microbiology: Recent Results (from the past 240 hour(s))  MRSA PCR SCREENING     Status: None   Collection Time    12/24/13  7:33 PM       Result Value Ref Range Status   MRSA by PCR NEGATIVE  NEGATIVE Final   Comment:            The GeneXpert MRSA Assay (FDA     approved for NASAL specimens     only), is one component of a     comprehensive MRSA colonization     surveillance program. It is not     intended to diagnose MRSA     infection nor to guide or     monitor treatment for     MRSA infections.     Labs: Basic Metabolic Panel:  Recent Labs Lab 12/24/13 1957 12/24/13 2123 12/24/13 2303 12/25/13 0120 12/25/13 0515  NA 132* 132* 133* 133* 135*  K 4.4 4.1 4.1 4.3 4.4  CL 94* 94* 97 98 99  CO2 19 21 24 24 24   GLUCOSE 419* 334* 272* 209* 79  BUN 13 12 11 11 9   CREATININE 0.80 0.78 0.74 0.75 0.71  CALCIUM 8.9 9.2 8.7 8.7 8.8   Liver Function Tests:  Recent Labs Lab 12/22/13 2026 12/23/13 1916 12/24/13 1520  AST 18 10 12   ALT 14 10 13   ALKPHOS 179* 164* 178*  BILITOT 0.2* 0.2* 0.6  PROT 8.4* 7.8 8.8*  ALBUMIN 2.8* 2.6* 3.1*    Recent Labs Lab 12/22/13 2026 12/23/13 1916  LIPASE 28 27   No results found for this basename: AMMONIA,  in the last 168 hours CBC:  Recent Labs Lab 12/22/13 2026 12/23/13 1916 12/24/13 1520 12/24/13 1957  WBC 13.3* 17.9* 16.9* 17.7*  NEUTROABS 8.0* 13.5* 13.7*  --   HGB 11.4* 10.4* 11.1* 10.0*  HCT 34.9* 31.4* 34.4* 29.8*  MCV 80.4 79.7 81.7 79.3  PLT 581* 590* 625* 573*   Cardiac Enzymes: No results found for this basename: CKTOTAL, CKMB, CKMBINDEX, TROPONINI,  in the last 168 hours BNP: BNP (last 3 results)  Recent Labs  09/25/13 2005 09/28/13 0430 10/02/13 0445  PROBNP 1195.0* 673.0* 2451.0*   CBG:  Recent Labs Lab 12/25/13 0338 12/25/13 0441 12/25/13 0517 12/25/13 0615 12/25/13 0720  GLUCAP 93 80 78 82 131*       Signed:  Eun Vermeer N  [facility] 12/25/2013, 10:55 AM

## 2013-12-25 NOTE — Telephone Encounter (Signed)
I have scheduled him follow up and sent a letter to the patient.  He is asked to start on gastroparesis diet step 1 and follow up on 02/15/14 2:00 with Dr. Hilarie Fredrickson

## 2013-12-25 NOTE — Telephone Encounter (Signed)
Patient is inpatient for gastroparesis and DKA admitted yesterday.  Looks like he will be d/c later today.  He is supposed to have follow up with his primary care a week after discharge.  Do you still want me to restart reglan.  Next office visit with you will be 02/15/14 unless you work him in.  Please advise

## 2013-12-25 NOTE — Telephone Encounter (Signed)
Pt stated he will be discharge today

## 2013-12-30 ENCOUNTER — Encounter: Payer: Self-pay | Admitting: Family Medicine

## 2013-12-30 DIAGNOSIS — E1140 Type 2 diabetes mellitus with diabetic neuropathy, unspecified: Secondary | ICD-10-CM

## 2013-12-30 NOTE — Telephone Encounter (Signed)
Pt states he ha not heard anything regarding is rx, requesting a call this mourning to let him know if his rx will be filled.

## 2013-12-30 NOTE — Telephone Encounter (Signed)
I left a voice message with below information. 

## 2013-12-30 NOTE — ED Provider Notes (Signed)
Medical screening examination/treatment/procedure(s) were performed by non-physician practitioner and as supervising physician I was immediately available for consultation/collaboration.   EKG Interpretation None        Orpah Greek, MD 12/30/13 [phone]

## 2013-12-31 MED ORDER — OXYCODONE HCL ER 15 MG PO T12A: 15.0000 mg | EXTENDED_RELEASE_TABLET | Freq: Two times a day (BID) | ORAL | Status: DC

## 2013-12-31 MED ORDER — OXYCODONE-ACETAMINOPHEN 10-325 MG PO TABS: 1.0000 | ORAL_TABLET | Freq: Four times a day (QID) | ORAL | Status: DC | PRN

## 2013-12-31 NOTE — Telephone Encounter (Signed)
NO we need to stick with oxycodone or oxycontin. See my other note

## 2013-12-31 NOTE — Telephone Encounter (Signed)
I spoke with pt  

## 2013-12-31 NOTE — Telephone Encounter (Signed)
I spoke with pt and we do have 2 scripts ready for pick up.

## 2013-12-31 NOTE — Telephone Encounter (Signed)
Done, but tell him I have put in a referral for a Pain Management clinic. Also ask him about Medicaid. He recently told us he now has Medicaid but his chart says otherwise

## 2014-01-05 ENCOUNTER — Encounter: Payer: Self-pay | Admitting: Internal Medicine

## 2014-01-11 NOTE — Telephone Encounter (Signed)
lvm for pt to call me back regarding medication he called about

## 2014-01-29 ENCOUNTER — Emergency Department (HOSPITAL_BASED_OUTPATIENT_CLINIC_OR_DEPARTMENT_OTHER): Payer: Medicaid Other

## 2014-01-29 ENCOUNTER — Inpatient Hospital Stay (HOSPITAL_COMMUNITY): Payer: Medicaid Other

## 2014-01-29 ENCOUNTER — Encounter (HOSPITAL_BASED_OUTPATIENT_CLINIC_OR_DEPARTMENT_OTHER): Payer: Self-pay | Admitting: Emergency Medicine

## 2014-01-29 ENCOUNTER — Inpatient Hospital Stay (HOSPITAL_BASED_OUTPATIENT_CLINIC_OR_DEPARTMENT_OTHER)
Admission: EM | Admit: 2014-01-29 | Discharge: 2014-02-05 | DRG: 871 | Disposition: A | Discharge status: Rehabilitation Facility | Payer: Medicaid Other | Attending: Internal Medicine | Admitting: Internal Medicine

## 2014-01-29 DIAGNOSIS — R29898 Other symptoms and signs involving the musculoskeletal system: Secondary | ICD-10-CM | POA: Diagnosis present

## 2014-01-29 DIAGNOSIS — E1143 Type 2 diabetes mellitus with diabetic autonomic (poly)neuropathy: Secondary | ICD-10-CM | POA: Diagnosis present

## 2014-01-29 DIAGNOSIS — E10628 Type 1 diabetes mellitus with other skin complications: Secondary | ICD-10-CM

## 2014-01-29 DIAGNOSIS — B370 Candidal stomatitis: Secondary | ICD-10-CM | POA: Diagnosis present

## 2014-01-29 DIAGNOSIS — L03115 Cellulitis of right lower limb: Secondary | ICD-10-CM

## 2014-01-29 DIAGNOSIS — Z9111 Patient's noncompliance with dietary regimen: Secondary | ICD-10-CM

## 2014-01-29 DIAGNOSIS — Z833 Family history of diabetes mellitus: Secondary | ICD-10-CM

## 2014-01-29 DIAGNOSIS — A419 Sepsis, unspecified organism: Principal | ICD-10-CM | POA: Diagnosis present

## 2014-01-29 DIAGNOSIS — G839 Paralytic syndrome, unspecified: Secondary | ICD-10-CM | POA: Diagnosis present

## 2014-01-29 DIAGNOSIS — Z794 Long term (current) use of insulin: Secondary | ICD-10-CM

## 2014-01-29 DIAGNOSIS — E11621 Type 2 diabetes mellitus with foot ulcer: Secondary | ICD-10-CM | POA: Diagnosis present

## 2014-01-29 DIAGNOSIS — F17200 Nicotine dependence, unspecified, uncomplicated: Secondary | ICD-10-CM | POA: Diagnosis present

## 2014-01-29 DIAGNOSIS — E1142 Type 2 diabetes mellitus with diabetic polyneuropathy: Secondary | ICD-10-CM | POA: Diagnosis present

## 2014-01-29 DIAGNOSIS — L02419 Cutaneous abscess of limb, unspecified: Secondary | ICD-10-CM | POA: Diagnosis present

## 2014-01-29 DIAGNOSIS — S90129A Contusion of unspecified lesser toe(s) without damage to nail, initial encounter: Secondary | ICD-10-CM

## 2014-01-29 DIAGNOSIS — Z825 Family history of asthma and other chronic lower respiratory diseases: Secondary | ICD-10-CM

## 2014-01-29 DIAGNOSIS — R739 Hyperglycemia, unspecified: Secondary | ICD-10-CM

## 2014-01-29 DIAGNOSIS — L97909 Non-pressure chronic ulcer of unspecified part of unspecified lower leg with unspecified severity: Secondary | ICD-10-CM

## 2014-01-29 DIAGNOSIS — G894 Chronic pain syndrome: Secondary | ICD-10-CM

## 2014-01-29 DIAGNOSIS — M545 Low back pain, unspecified: Secondary | ICD-10-CM | POA: Diagnosis present

## 2014-01-29 DIAGNOSIS — Z9114 Patient's other noncompliance with medication regimen: Secondary | ICD-10-CM

## 2014-01-29 DIAGNOSIS — G0489 Other myelitis: Secondary | ICD-10-CM | POA: Diagnosis present

## 2014-01-29 DIAGNOSIS — Z720 Tobacco use: Secondary | ICD-10-CM

## 2014-01-29 DIAGNOSIS — E1065 Type 1 diabetes mellitus with hyperglycemia: Secondary | ICD-10-CM | POA: Diagnosis present

## 2014-01-29 DIAGNOSIS — E1140 Type 2 diabetes mellitus with diabetic neuropathy, unspecified: Secondary | ICD-10-CM | POA: Diagnosis present

## 2014-01-29 DIAGNOSIS — K219 Gastro-esophageal reflux disease without esophagitis: Secondary | ICD-10-CM | POA: Diagnosis present

## 2014-01-29 DIAGNOSIS — K3184 Gastroparesis: Secondary | ICD-10-CM | POA: Diagnosis present

## 2014-01-29 DIAGNOSIS — D509 Iron deficiency anemia, unspecified: Secondary | ICD-10-CM | POA: Diagnosis present

## 2014-01-29 DIAGNOSIS — L089 Local infection of the skin and subcutaneous tissue, unspecified: Secondary | ICD-10-CM

## 2014-01-29 DIAGNOSIS — E1040 Type 1 diabetes mellitus with diabetic neuropathy, unspecified: Secondary | ICD-10-CM

## 2014-01-29 DIAGNOSIS — E1049 Type 1 diabetes mellitus with other diabetic neurological complication: Secondary | ICD-10-CM | POA: Diagnosis present

## 2014-01-29 DIAGNOSIS — G8929 Other chronic pain: Secondary | ICD-10-CM | POA: Diagnosis present

## 2014-01-29 DIAGNOSIS — R4182 Altered mental status, unspecified: Secondary | ICD-10-CM | POA: Diagnosis present

## 2014-01-29 DIAGNOSIS — J690 Pneumonitis due to inhalation of food and vomit: Secondary | ICD-10-CM

## 2014-01-29 DIAGNOSIS — J189 Pneumonia, unspecified organism: Secondary | ICD-10-CM | POA: Diagnosis present

## 2014-01-29 DIAGNOSIS — L97509 Non-pressure chronic ulcer of other part of unspecified foot with unspecified severity: Secondary | ICD-10-CM | POA: Diagnosis present

## 2014-01-29 DIAGNOSIS — E1110 Type 2 diabetes mellitus with ketoacidosis without coma: Secondary | ICD-10-CM

## 2014-01-29 DIAGNOSIS — Z8249 Family history of ischemic heart disease and other diseases of the circulatory system: Secondary | ICD-10-CM

## 2014-01-29 DIAGNOSIS — E1069 Type 1 diabetes mellitus with other specified complication: Secondary | ICD-10-CM

## 2014-01-29 DIAGNOSIS — N179 Acute kidney failure, unspecified: Secondary | ICD-10-CM | POA: Diagnosis present

## 2014-01-29 DIAGNOSIS — Z823 Family history of stroke: Secondary | ICD-10-CM

## 2014-01-29 DIAGNOSIS — G373 Acute transverse myelitis in demyelinating disease of central nervous system: Secondary | ICD-10-CM | POA: Diagnosis present

## 2014-01-29 DIAGNOSIS — IMO0002 Reserved for concepts with insufficient information to code with codable children: Secondary | ICD-10-CM | POA: Diagnosis present

## 2014-01-29 DIAGNOSIS — J069 Acute upper respiratory infection, unspecified: Secondary | ICD-10-CM

## 2014-01-29 DIAGNOSIS — L03116 Cellulitis of left lower limb: Secondary | ICD-10-CM

## 2014-01-29 DIAGNOSIS — L03119 Cellulitis of unspecified part of limb: Secondary | ICD-10-CM

## 2014-01-29 DIAGNOSIS — E43 Unspecified severe protein-calorie malnutrition: Secondary | ICD-10-CM | POA: Diagnosis present

## 2014-01-29 LAB — CBC WITH DIFFERENTIAL/PLATELET
Band Neutrophils: 39 % — ABNORMAL HIGH (ref 0–10)
Basophils Absolute: 0 10*3/uL (ref 0.0–0.1)
Basophils Relative: 0 % (ref 0–1)
Blasts: 0 %
Eosinophils Absolute: 0 10*3/uL (ref 0.0–0.7)
Eosinophils Relative: 0 % (ref 0–5)
HEMATOCRIT: 28.1 % — AB (ref 39.0–52.0)
HEMOGLOBIN: 9.4 g/dL — AB (ref 13.0–17.0)
LYMPHS ABS: 1.9 10*3/uL (ref 0.7–4.0)
LYMPHS PCT: 6 % — AB (ref 12–46)
MCH: 26 pg (ref 26.0–34.0)
MCHC: 33.5 g/dL (ref 30.0–36.0)
MCV: 77.6 fL — ABNORMAL LOW (ref 78.0–100.0)
MONO ABS: 1.6 10*3/uL — AB (ref 0.1–1.0)
MONOS PCT: 5 % (ref 3–12)
Metamyelocytes Relative: 1 %
Myelocytes: 0 %
NEUTROS PCT: 49 % (ref 43–77)
Neutro Abs: 28.5 10*3/uL — ABNORMAL HIGH (ref 1.7–7.7)
Platelets: 462 10*3/uL — ABNORMAL HIGH (ref 150–400)
Promyelocytes Absolute: 0 %
RBC: 3.62 MIL/uL — ABNORMAL LOW (ref 4.22–5.81)
RDW: 14.5 % (ref 11.5–15.5)
WBC Morphology: INCREASED
WBC: 32 10*3/uL — AB (ref 4.0–10.5)
nRBC: 0 /100 WBC

## 2014-01-29 LAB — COMPREHENSIVE METABOLIC PANEL
ALT: 9 U/L (ref 0–53)
ALT: 8 U/L (ref 0–53)
AST: 10 U/L (ref 0–37)
AST: 12 U/L (ref 0–37)
Albumin: 1.8 g/dL — ABNORMAL LOW (ref 3.5–5.2)
Albumin: 1.5 g/dL — ABNORMAL LOW (ref 3.5–5.2)
Alkaline Phosphatase: 179 U/L — ABNORMAL HIGH (ref 39–117)
Alkaline Phosphatase: 190 U/L — ABNORMAL HIGH (ref 39–117)
BUN: 33 mg/dL — ABNORMAL HIGH (ref 6–23)
BUN: 22 mg/dL (ref 6–23)
CALCIUM: 9 mg/dL (ref 8.4–10.5)
CALCIUM: 8.4 mg/dL (ref 8.4–10.5)
CO2: 22 meq/L (ref 19–32)
CO2: 22 mEq/L (ref 19–32)
CREATININE: 1.6 mg/dL — AB (ref 0.50–1.35)
CREATININE: 0.98 mg/dL (ref 0.50–1.35)
Chloride: 95 mEq/L — ABNORMAL LOW (ref 96–112)
Chloride: 103 mEq/L (ref 96–112)
GFR calc Af Amer: 66 mL/min — ABNORMAL LOW (ref >90)
GFR calc Af Amer: >90 mL/min (ref >90)
GFR calc non Af Amer: >90 mL/min (ref >90)
GFR, EST NON AFRICAN AMERICAN: 57 mL/min — AB (ref >90)
GLUCOSE: 532 mg/dL — AB (ref 70–99)
Glucose, Bld: 136 mg/dL — ABNORMAL HIGH (ref 70–99)
Potassium: 4.4 mEq/L (ref 3.7–5.3)
Potassium: 3.9 mEq/L (ref 3.7–5.3)
SODIUM: 138 meq/L (ref 137–147)
Sodium: 132 mEq/L — ABNORMAL LOW (ref 137–147)
TOTAL PROTEIN: 6.8 g/dL (ref 6.0–8.3)
Total Bilirubin: 0.2 mg/dL — ABNORMAL LOW (ref 0.3–1.2)
Total Bilirubin: 0.3 mg/dL (ref 0.3–1.2)
Total Protein: 7.6 g/dL (ref 6.0–8.3)

## 2014-01-29 LAB — URINE MICROSCOPIC-ADD ON

## 2014-01-29 LAB — URINALYSIS, ROUTINE W REFLEX MICROSCOPIC
Bilirubin Urine: NEGATIVE
KETONES UR: NEGATIVE mg/dL
LEUKOCYTES UA: NEGATIVE
NITRITE: NEGATIVE
PROTEIN: 30 mg/dL — AB
Specific Gravity, Urine: 1.023 (ref 1.005–1.030)
UROBILINOGEN UA: 0.2 mg/dL (ref 0.0–1.0)
pH: 5.5 (ref 5.0–8.0)

## 2014-01-29 LAB — CBG MONITORING, ED
Glucose-Capillary: 441 mg/dL — ABNORMAL HIGH (ref 70–99)
Glucose-Capillary: 360 mg/dL — ABNORMAL HIGH (ref 70–99)
Glucose-Capillary: 291 mg/dL — ABNORMAL HIGH (ref 70–99)
Glucose-Capillary: 203 mg/dL — ABNORMAL HIGH (ref 70–99)
Glucose-Capillary: 156 mg/dL — ABNORMAL HIGH (ref 70–99)
Glucose-Capillary: 101 mg/dL — ABNORMAL HIGH (ref 70–99)
Glucose-Capillary: 65 mg/dL — ABNORMAL LOW (ref 70–99)
Glucose-Capillary: 59 mg/dL — ABNORMAL LOW (ref 70–99)

## 2014-01-29 LAB — GLUCOSE, CAPILLARY
GLUCOSE-CAPILLARY: 157 mg/dL — AB (ref 70–99)
Glucose-Capillary: 64 mg/dL — ABNORMAL LOW (ref 70–99)
Glucose-Capillary: 147 mg/dL — ABNORMAL HIGH (ref 70–99)

## 2014-01-29 LAB — TSH: TSH: 1.97 u[IU]/mL (ref 0.350–4.500)

## 2014-01-29 LAB — I-STAT VENOUS BLOOD GAS, ED
Bicarbonate: 25.9 mEq/L — ABNORMAL HIGH (ref 20.0–24.0)
O2 Saturation: 50 %
PCO2 VEN: 45.3 mmHg (ref 45.0–50.0)
PH VEN: 7.365 — AB (ref 7.250–7.300)
PO2 VEN: 28 mmHg — AB (ref 30.0–45.0)
TCO2: 27 mmol/L (ref 0–100)

## 2014-01-29 LAB — SEDIMENTATION RATE: SED RATE: 120 mm/h — AB (ref 0–16)

## 2014-01-29 LAB — I-STAT CG4 LACTIC ACID, ED: LACTIC ACID, VENOUS: 2.12 mmol/L (ref 0.5–2.2)

## 2014-01-29 LAB — MAGNESIUM: Magnesium: 1.7 mg/dL (ref 1.5–2.5)

## 2014-01-29 LAB — RETICULOCYTES
RBC.: 3.17 MIL/uL — AB (ref 4.22–5.81)
Retic Count, Absolute: 34.9 10*3/uL (ref 19.0–186.0)
Retic Ct Pct: 1.1 % (ref 0.4–3.1)

## 2014-01-29 LAB — PHOSPHORUS: Phosphorus: 3.1 mg/dL (ref 2.3–4.6)

## 2014-01-29 LAB — RAPID URINE DRUG SCREEN, HOSP PERFORMED
AMPHETAMINES: NOT DETECTED
BARBITURATES: NOT DETECTED
Benzodiazepines: NOT DETECTED
Cocaine: NOT DETECTED
Opiates: POSITIVE — AB
Tetrahydrocannabinol: NOT DETECTED

## 2014-01-29 LAB — LIPASE, BLOOD: LIPASE: 10 U/L — AB (ref 11–59)

## 2014-01-29 LAB — PRO B NATRIURETIC PEPTIDE: Pro B Natriuretic peptide (BNP): 1343 pg/mL — ABNORMAL HIGH (ref 0–125)

## 2014-01-29 LAB — PROTIME-INR
INR: 1.12 (ref 0.00–1.49)
PROTHROMBIN TIME: 14.2 s (ref 11.6–15.2)

## 2014-01-29 LAB — CK: Total CK: 41 U/L (ref 7–232)

## 2014-01-29 MED ORDER — ACETAMINOPHEN 325 MG PO TABS: 650.0000 mg | ORAL_TABLET | Freq: Four times a day (QID) | ORAL | Status: DC | PRN

## 2014-01-29 MED ORDER — DEXTROSE-NACL 5-0.45 % IV SOLN
INTRAVENOUS | Status: DC
  Administered 2014-01-29: 10:00:00 via INTRAVENOUS

## 2014-01-29 MED ORDER — FENTANYL CITRATE 0.05 MG/ML IJ SOLN: 12.5000 ug | Freq: Once | INTRAMUSCULAR | Status: AC | PRN

## 2014-01-29 MED ORDER — ONDANSETRON HCL 4 MG PO TABS: 4.0000 mg | ORAL_TABLET | Freq: Four times a day (QID) | ORAL | Status: DC | PRN

## 2014-01-29 MED ORDER — SODIUM CHLORIDE 0.9 % IJ SOLN
3.0000 mL | Freq: Two times a day (BID) | INTRAMUSCULAR | Status: DC
  Administered 2014-01-30 – 2014-02-04 (×10): 3 mL via INTRAVENOUS

## 2014-01-29 MED ORDER — DEXTROSE 50 % IV SOLN
INTRAVENOUS | Status: AC
  Filled 2014-01-29: qty 50

## 2014-01-29 MED ORDER — VANCOMYCIN HCL IN DEXTROSE 1-5 GM/200ML-% IV SOLN
1000.0000 mg | INTRAVENOUS | Status: AC
  Administered 2014-01-29: 1000 mg via INTRAVENOUS
  Filled 2014-01-29: qty 200

## 2014-01-29 MED ORDER — SODIUM CHLORIDE 0.9 % IV SOLN
INTRAVENOUS | Status: DC
Start: 2014-01-29 — End: 2014-01-29
  Administered 2014-01-29: 09:00:00 via INTRAVENOUS

## 2014-01-29 MED ORDER — DEXTROSE 50 % IV SOLN
16.0000 mL | Freq: Once | INTRAVENOUS | Status: AC
  Administered 2014-01-29: 16 mL via INTRAVENOUS

## 2014-01-29 MED ORDER — SODIUM CHLORIDE 0.9 % IV BOLUS (SEPSIS)
1000.0000 mL | Freq: Once | INTRAVENOUS | Status: AC
  Administered 2014-01-29: 1000 mL via INTRAVENOUS

## 2014-01-29 MED ORDER — VANCOMYCIN HCL 500 MG IV SOLR
500.0000 mg | Freq: Two times a day (BID) | INTRAVENOUS | Status: DC
  Filled 2014-01-29: qty 500

## 2014-01-29 MED ORDER — ONDANSETRON HCL 4 MG/2ML IJ SOLN
4.0000 mg | Freq: Once | INTRAMUSCULAR | Status: AC
  Administered 2014-01-29: 4 mg via INTRAVENOUS
  Filled 2014-01-29: qty 2

## 2014-01-29 MED ORDER — VANCOMYCIN HCL 500 MG IV SOLR
500.0000 mg | Freq: Two times a day (BID) | INTRAVENOUS | Status: DC
  Administered 2014-01-30 – 2014-01-31 (×4): 500 mg via INTRAVENOUS
  Filled 2014-01-29 (×5): qty 500

## 2014-01-29 MED ORDER — ASPIRIN 325 MG PO TABS
325.0000 mg | ORAL_TABLET | Freq: Every day | ORAL | Status: DC
  Filled 2014-01-29 (×2): qty 1

## 2014-01-29 MED ORDER — DEXTROSE 50 % IV SOLN: 25.0000 mL | Freq: Once | INTRAVENOUS | Status: DC | PRN

## 2014-01-29 MED ORDER — FENTANYL CITRATE 0.05 MG/ML IJ SOLN
INTRAMUSCULAR | Status: AC
  Filled 2014-01-29: qty 2

## 2014-01-29 MED ORDER — ACETAMINOPHEN 650 MG RE SUPP
650.0000 mg | Freq: Four times a day (QID) | RECTAL | Status: DC | PRN
  Administered 2014-02-05: 650 mg via RECTAL
  Filled 2014-01-29: qty 1

## 2014-01-29 MED ORDER — ASPIRIN 300 MG RE SUPP
300.0000 mg | Freq: Every day | RECTAL | Status: DC
  Administered 2014-01-30 (×2): 300 mg via RECTAL
  Filled 2014-01-29 (×2): qty 1

## 2014-01-29 MED ORDER — FENTANYL CITRATE 0.05 MG/ML IJ SOLN
50.0000 ug | Freq: Once | INTRAMUSCULAR | Status: AC
  Administered 2014-01-29: 50 ug via INTRAVENOUS

## 2014-01-29 MED ORDER — SODIUM CHLORIDE 0.9 % IV SOLN
INTRAVENOUS | Status: DC
  Administered 2014-01-29: 1000 mL via INTRAVENOUS
  Administered 2014-01-31 – 2014-02-03 (×4): via INTRAVENOUS

## 2014-01-29 MED ORDER — LORAZEPAM 2 MG/ML IJ SOLN
INTRAMUSCULAR | Status: AC
  Filled 2014-01-29: qty 1

## 2014-01-29 MED ORDER — DEXTROSE 5 % IV SOLN
2.0000 g | Freq: Once | INTRAVENOUS | Status: DC
  Filled 2014-01-29: qty 2

## 2014-01-29 MED ORDER — LORAZEPAM 2 MG/ML IJ SOLN
0.5000 mg | Freq: Once | INTRAMUSCULAR | Status: AC
  Administered 2014-01-29: 0.5 mg via INTRAVENOUS

## 2014-01-29 MED ORDER — SODIUM CHLORIDE 0.9 % IV SOLN
INTRAVENOUS | Status: DC
  Administered 2014-01-29: 3.8 [IU]/h via INTRAVENOUS

## 2014-01-29 MED ORDER — SODIUM CHLORIDE 0.9 % IV SOLN: INTRAVENOUS | Status: AC

## 2014-01-29 MED ORDER — ONDANSETRON 8 MG/NS 50 ML IVPB
8.0000 mg | Freq: Four times a day (QID) | INTRAVENOUS | Status: DC | PRN
  Filled 2014-01-29: qty 8

## 2014-01-29 MED ORDER — DEXTROSE 50 % IV SOLN
25.0000 mL | Freq: Once | INTRAVENOUS | Status: AC
  Administered 2014-01-29: 25 mL via INTRAVENOUS

## 2014-01-29 MED ORDER — DEXTROSE 5 % IV SOLN
1.0000 g | Freq: Three times a day (TID) | INTRAVENOUS | Status: DC
  Administered 2014-01-30 – 2014-02-01 (×8): 1 g via INTRAVENOUS
  Filled 2014-01-29 (×13): qty 1

## 2014-01-29 MED ORDER — HEPARIN SODIUM (PORCINE) 5000 UNIT/ML IJ SOLN
5000.0000 [IU] | Freq: Three times a day (TID) | INTRAMUSCULAR | Status: DC
  Administered 2014-01-30 (×2): 5000 [IU] via SUBCUTANEOUS
  Filled 2014-01-29 (×5): qty 1

## 2014-01-29 MED ORDER — FENTANYL CITRATE 0.05 MG/ML IJ SOLN
25.0000 ug | Freq: Once | INTRAMUSCULAR | Status: AC
  Administered 2014-01-29: 25 ug via INTRAVENOUS
  Filled 2014-01-29: qty 2

## 2014-01-29 MED ORDER — INSULIN REGULAR HUMAN 100 UNIT/ML IJ SOLN
INTRAMUSCULAR | Status: AC
Start: 2014-01-29 — End: 2014-01-29
  Filled 2014-01-29: qty 1

## 2014-01-29 NOTE — ED Notes (Signed)
CBG was 59, 49ml of dextrose 50% given IVP per glucostabilizer

## 2014-01-29 NOTE — Progress Notes (Signed)
Pt arrived via [facility] from [facility]. CBG on arrival was 64-after amps and OJ (please see previous notes). Notified attending and new order for D50 amp to be given IV was carried out. Will recheck CBG and continue to monitor. Pt current not on insulin gtt. VSS. No family at bedside.

## 2014-01-29 NOTE — ED Notes (Signed)
[facility] at bedside 

## 2014-01-29 NOTE — H&P (Signed)
[facility] History and Physical  Patient: Jason [name]  WUJ:[id number]  DOB: 22-Sep-1986  DOS: the patient was seen and examined on 01/29/2014 PCP: [name], MD  Chief Complaint: Leg weakness  HPI: Jason [name] is a 28 y.o. male with Past medical history of diabetes, diabetic neuropathy, gastroparesis, GERD, diabetes ulcers. The patient was brought in by his family. The history was obtained from patient as well as his family. 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 In the afternoon at around 4:00 the patient went to sleep and when the significant other tried to wake the patient up at 8 PM the patient was complaining that he could not feel his leg and could not move his leg. Patient has history of diabetic neuropathy due to which she does not have significant sensation in his legs. Patient went to sleep again and around midnight when significant other check his sugar it was registered as "high". She gave him some insulin and in the morning when the patient woke up he continues to complain of numbness and weakness in his legs with inability to move. When the significant other came back after dropping the kids in the morning She found that patient's abdomen was distended at which time she called EMS. There is no trauma no fall no fever no chills no cough no chest pain no shortness of breath reported. There is no diarrhea or constipation no abdominal pain no nausea no vomiting reported. Patient mentions he is compliant with his medication although yesterday he has not taken any medication other than Lyrica and insulin. Also as per significant other the patient has been having progressively worsening ulcers on his bilateral lower extremity since last one week. He also has worsening of his healing ulcer on bilateral toe. Family reports significant family history of cervical malformation. At the time of my evaluation the  patient was on sitting questions and dozing back to sleep. Family mentions patient stops breathing for a while when he is sleeping since last many years.  Patient was found to have significantly elevated blood sugar there and therefore was started on insulin drip and was transferred here.   The patient is coming from home. And at his baseline independent for most of his ADL.  Review of Systems: as mentioned in the history of present illness.  A Comprehensive review of the other systems is negative.  Past Medical History  Diagnosis Date  . Diabetes mellitus   . GERD (gastroesophageal reflux disease)   . Asthma   . Hx MRSA infection     on face  . Gastroparesis   . Diabetic neuropathy   . Seizures    Past Surgical History  Procedure Laterality Date  . Tonsillectomy     Social History:  reports that he has been smoking Cigars.  He has never used smokeless tobacco. He reports that he drinks alcohol. He reports that he does not use illicit drugs.  Allergies  Allergen Reactions  . Cefuroxime Axetil Anaphylaxis  . Penicillins Anaphylaxis    ?can take amoxicillin?  [name] [Benzonatate] Anaphylaxis  . Shellfish Allergy Itching    Family History  Problem Relation Age of Onset  . Diabetes Father   . Hypertension Father   . Asthma      fhx  . Hypertension      fhx  . Stroke      fhx  . Heart disease Mother  Prior to Admission medications   Medication Sig Start Date End Date Taking? Authorizing Provider  dexlansoprazole (DEXILANT) 60 MG capsule Take 1 capsule (60 mg total) by mouth daily. 12/24/13   Jerene Bears, MD  doxycycline (VIBRA-TABS) 100 MG tablet Take 1 tablet (100 mg total) by mouth every 12 (twelve) hours. 12/25/13   Kinnie Feil, MD  insulin aspart (NOVOLOG) 100 UNIT/ML injection Inject 1-17 Units into the skin 4 (four) times daily. Per sliding scale 12/25/13   Kinnie Feil, MD  insulin glargine (LANTUS) 100 UNIT/ML injection Inject 0.2 mLs (20 Units  total) into the skin daily. 12/26/13   Kinnie Feil, MD  metoCLOPramide (REGLAN) 10 MG/10ML SOLN Take 10 mg by mouth 3 (three) times daily as needed for nausea or vomiting. 07/02/13   Costin Karlyne Greenspan, MD  morphine (AVINZA) 60 MG 24 hr capsule Take 60 mg by mouth daily.    Historical Provider, MD  Omeprazole-Sodium Bicarbonate (ZEGERID OTC) 20-1100 MG CAPS Take 1 capsule by mouth at bedtime.     Historical Provider, MD  ondansetron (ZOFRAN) 8 MG tablet Take 1 tablet (8 mg total) by mouth every 12 (twelve) hours as needed for nausea. 07/02/13   Costin Karlyne Greenspan, MD  OxyCODONE (OXYCONTIN) 15 mg T12A 12 hr tablet Take 1 tablet (15 mg total) by mouth every 12 (twelve) hours. 12/31/13   [name], MD  oxyCODONE-acetaminophen (PERCOCET) 10-325 MG per tablet Take 1 tablet by mouth every 6 (six) hours as needed for pain. 12/31/13   [name], MD  pregabalin (LYRICA) 100 MG capsule Take 100 mg by mouth 3 (three) times daily. 07/02/13   Costin Karlyne Greenspan, MD  silver sulfADIAZINE (SILVADENE) 1 % cream Apply topically daily. 12/25/13   Kinnie Feil, MD    Physical Exam: Filed Vitals:   01/29/14 1606 01/29/14 1800 01/29/14 1847 01/29/14 2015  BP: 107/63  97/65 95/80  Pulse: 98   95  Temp:   99.3 F (37.4 C) 99.1 F (37.3 C)  TempSrc:   Oral Oral  Resp: _0 Height:  5' 7.75" (1.721 m)    Weight:  63.5 kg (139 lb 15.9 oz)    SpO2: 99% 97%  98%    General: Alert, Awake and Oriented to Time, Place and Person. Appear in marked distress Eyes: PERRL ENT: Oral Mucosa clear dry. Neck: No  JVD Cardiovascular: S1 and S2 Present, no  Murmur, Peripheral Pulses Present Respiratory: Bilateral Air entry equal and Decreased, coarse breath sounds, no  Crackles,no  wheezes Abdomen: Bowel Sound Present, Soft and diffuse mild abdominal tenderNo guarding no rigidity , Skin: No  Rash Extremities: Multiple ulcers noted bilaterally on lower extremity with warmth and swelling, bilateral toe ulcers also  noted  No calf tenderness Neurologic: Mental status lethargic , Cranial Nerves pupils are reactive, extraocular muscle movement intact, cough present , Motor strength upper extremity moves spontaneously, bilateral lower extremity 0/5 , Sensation withdraws to pain, reflexes  biceps present, babinski  unable to do, Cerebellar test  unable to do.  Labs on Admission:  CBC:  Recent Labs Lab 01/29/14 0858  WBC 32.0*  NEUTROABS 28.5*  HGB 9.4*  HCT 28.1*  MCV 77.6*  PLT 462*    CMP     Component Value Date/Time   NA 132* 01/29/2014 0858   K 4.4 01/29/2014 0858   CL 95* 01/29/2014 0858   CO2 22 01/29/2014 0858   GLUCOSE 532* 01/29/2014 3276  BUN 33* 01/29/2014 0858   CREATININE 1.60* 01/29/2014 0858   CALCIUM 9.0 01/29/2014 0858   PROT 7.6 01/29/2014 0858   ALBUMIN 1.8* 01/29/2014 0858   AST 10 01/29/2014 0858   ALT 9 01/29/2014 0858   ALKPHOS 179* 01/29/2014 0858   BILITOT 0.2* 01/29/2014 0858   GFRNONAA 57* 01/29/2014 0858   GFRAA 66* 01/29/2014 0858     Recent Labs Lab 01/29/14 0858  LIPASE 10*   No results found for this basename: AMMONIA,  in the last 168 hours  No results found for this basename: CKTOTAL, CKMB, CKMBINDEX, TROPONINI,  in the last 168 hours BNP (last 3 results)  Recent Labs  09/25/13 2005 09/28/13 0430 10/02/13 0445  PROBNP 1195.0* 673.0* 2451.0*    Radiological Exams on Admission: Ct Head Wo Contrast  01/29/2014   CLINICAL DATA:  Hyperglycemia.  Diabetes.  EXAM: CT HEAD WITHOUT CONTRAST  TECHNIQUE: Contiguous axial images were obtained from the base of the skull through the vertex without intravenous contrast.  COMPARISON:  None.  FINDINGS: Image quality degraded by mild motion.  Ventricle size is normal. Small hypodensity left frontal white matter, likely due to chronic ischemia. Negative for acute infarct, hemorrhage, or mass lesion. Calvarium intact. Visualized sinuses are clear.  IMPRESSION: Small hypodensity left frontal white matter, most consistent  with mild chronic microvascular ischemia. No acute abnormality.   Electronically Signed   By: Franchot Gallo M.D.   On: 01/29/2014 11:16   Ct Lumbar Spine Wo Contrast  01/29/2014   CLINICAL DATA:  Chronic neuropathy. Diabetes. Bilateral leg numbness. Hyperglycemia.  EXAM: CT LUMBAR SPINE WITHOUT CONTRAST  TECHNIQUE: Multidetector CT imaging of the lumbar spine was performed without intravenous contrast administration. Multiplanar CT image reconstructions were also generated.  COMPARISON:  None.  FINDINGS: Negative for fracture or mass. Disc spaces are normal. No degenerative change. Facet joints are normal.  No soft tissue swelling. The psoas muscle and retroperitoneum are normal.  No evidence of spinal infection. MRI with contrast is more sensitive than CT for detection of spinal infection.  IMPRESSION: Normal   Electronically Signed   By: Franchot Gallo M.D.   On: 01/29/2014 11:28   Dg Chest Port 1 View  01/29/2014   CLINICAL DATA:  Lethargy, fatigue  EXAM: PORTABLE CHEST - 1 VIEW  COMPARISON:  10/02/2013  FINDINGS: Cardiac shadow is stable. The lungs are well aerated bilaterally. Mild patchy infiltrative density is noted in the right mid and upper lung. No acute bony abnormality is noted. Chronic changes of the distal left clavicle are again seen.  IMPRESSION: Patchy infiltrate in the right mid and upper lung. Followup films are recommended.   Electronically Signed   By: Inez Catalina M.D.   On: 01/29/2014 09:16   Dg Toe Great Left  01/29/2014   CLINICAL DATA:  Diabetic patient.  Skin ulceration.  EXAM: LEFT GREAT TOE  COMPARISON:  None.  FINDINGS: Soft tissues of the great toe appear swollen. No bony destructive change or soft tissue gas collection is identified. There is no fracture or dislocation.  IMPRESSION: Soft tissue swelling without plain film evidence of osteomyelitis or other acute bony abnormality.   Electronically Signed   By: Inge Rise M.D.   On: 01/29/2014 13:40   Dg Toe Great  Right  01/29/2014   CLINICAL DATA:  Hyperglycemia  EXAM: RIGHT GREAT TOE  COMPARISON:  MRI 4 foot 01/29/2013 ; prior radiographs of the right foot 01/28/2013  FINDINGS: Tissue irregularity of the medial aspect  of the great toe at the interphalangeal joint concerning for cellulitis or ulceration. Soft tissue swelling and bulging of the nail bed. No conventional radiographic evidence of osteomyelitis. No acute fracture or malalignment. Atherosclerotic calcifications noted in the visualized small vessels.  IMPRESSION: 1. Diffuse soft tissue swelling about the toe concerning for cellulitis. 2. No evidence of acute osseous abnormality.   Electronically Signed   By: Jacqulynn Cadet M.D.   On: 01/29/2014 13:41    EKG: Independently reviewed. normal sinus rhythm, nonspecific ST and T waves changes, chronic J. point elevation.  Assessment/Plan Principal Problem:   HCAP (healthcare-associated pneumonia) Active Problems:   Type 1 diabetes, uncontrolled, with neuropathy   GERD   Painful diabetic neuropathy   Chronic low back pain   Gastroparesis due to DM   Protein-calorie malnutrition, severe   Leg weakness   Bilateral lower leg cellulitis   Altered mental status   1. bilateral lower leg cellulitis, possible healthcare associated pneumonia The patient is presenting with generalized weakness. His chest x-ray is showing evidence of possible pneumonia. He does have bilateral lower extremity cellulitis with an ESR of 120. Although the x-ray is not showing any evidence of osteomyelitis. This the patient has already been started on broad-spectrum antibiotic depending on his allergies IV vancomycin and IV Azactam. Blood cultures urine cultures sputum cultures will be obtained urine antigens will be to Wound care consulted in the morning, may require further imaging related to his legs.  2. Bilateral leg weakness Altered mental status Patient presented with bilateral lower extremity weakness and  numbness. He also has been drowsy but arousable. His CT scan of the head shows microvascular ischemia and no acute infarct. 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 Also his MRI C-spine was obtained after discussing with neurology which is showing possible SYRINX or TM. We will follow neurology recommendations for further input. At present holding off on steroids due to ongoing infection. His altered mental status appears to be multifactorial, toxic, metabolic. At present holding off on placing him on narcotic medications, unfortunately patient required IV lorazepam for MRI.  3. acute on chronic kidney disease Market improvement at present. Initially his history of present illness was thought to be the cause of his mental status changes due to accumulation of his medication. Currently with improvement in his kidney function hopefully there will be improvement in his mental  as status well.  4. Diabetes Sliding scale, patient initially presented with significant hyperglycemia which is likely secondary to infection, after starting him on IV insulin drip his sugar dropped rapidly to 50s. Currently his blood sugar has been in range of 100 without any dextrose drip. Continue to monitor CBG every 2 hours and CMP every 4 hours.  Consults:  neurology, appreciate their input  DVT Prophylaxis: subcutaneous Heparin Nutrition:  n.p.o.  Code Status:  full  Family Communication:  family was present at bedside, opportunity was given to ask question and all questions were answered satisfactorily at the time of interview. Disposition: Admitted to inpatient in step-down unit.  Author: Berle Mull, MD [facility] Pager: [phone] 01/29/2014, 8:39 PM    If 7PM-7AM, please contact night-coverage [email or web address] Password [email or web address]

## 2014-01-29 NOTE — ED Provider Notes (Signed)
CSN: [id number]     Arrival date & time 01/29/14  [id number] History   First MD Initiated Contact with Patient 01/29/14 [phone]     Chief Complaint  Patient presents with  . Hyperglycemia     (Consider location/radiation/quality/duration/timing/severity/associated sxs/prior Treatment) The history is provided by the patient.   28 year old male brought in by EMS. Followed by Dr. Sarajane Jews. Patient with complaint of not being L. to move his legs his blood sugars being high EMS reported 500 at home. Patient has several admissions for DKA and other diabetic related problems. Patient's had long-standing leg wounds. Patient has significant neuropathy to both legs. As of 8:00 last night patient states she couldn't use any of his legs at all. Normally does walk. Patient also states that his abdomen is swollen.  Past Medical History  Diagnosis Date  . Diabetes mellitus   . GERD (gastroesophageal reflux disease)   . Asthma   . Hx MRSA infection     on face  . Gastroparesis   . Diabetic neuropathy   . Seizures    Past Surgical History  Procedure Laterality Date  . Tonsillectomy     Family History  Problem Relation Age of Onset  . Diabetes Father   . Hypertension Father   . Asthma      fhx  . Hypertension      fhx  . Stroke      fhx  . Heart disease Mother    History  Substance Use Topics  . Smoking status: Current Some Day Smoker    Types: Cigars  . Smokeless tobacco: Never Used     Comment: e-cigarette  . Alcohol Use: Yes     Comment: rarely    Review of Systems  Constitutional: Positive for fever.  HENT: Negative for congestion.   Eyes: Negative for redness.  Respiratory: Negative for cough and shortness of breath.   Cardiovascular: Negative for chest pain and leg swelling.  Gastrointestinal: Negative for abdominal pain.  Genitourinary: Positive for decreased urine volume and difficulty urinating.  Musculoskeletal: Positive for back pain.  Skin: Positive for rash and wound.   Neurological: Negative for headaches.  Hematological: Does not bruise/bleed easily.  Psychiatric/Behavioral: Negative for confusion.      Allergies  Cefuroxime axetil; Penicillins; Tessalon; and Shellfish allergy  Home Medications   Prior to Admission medications   Medication Sig Start Date End Date Taking? Authorizing Provider  dexlansoprazole (DEXILANT) 60 MG capsule Take 1 capsule (60 mg total) by mouth daily. 12/24/13   Jerene Bears, MD  doxycycline (VIBRA-TABS) 100 MG tablet Take 1 tablet (100 mg total) by mouth every 12 (twelve) hours. 12/25/13   Kinnie Feil, MD  insulin aspart (NOVOLOG) 100 UNIT/ML injection Inject 1-17 Units into the skin 4 (four) times daily. Per sliding scale 12/25/13   Kinnie Feil, MD  insulin glargine (LANTUS) 100 UNIT/ML injection Inject 0.2 mLs (20 Units total) into the skin daily. 12/26/13   Kinnie Feil, MD  metoCLOPramide (REGLAN) 10 MG/10ML SOLN Take 10 mg by mouth 3 (three) times daily as needed for nausea or vomiting. 07/02/13   Costin Karlyne Greenspan, MD  morphine (AVINZA) 60 MG 24 hr capsule Take 60 mg by mouth daily.    Historical Provider, MD  Omeprazole-Sodium Bicarbonate (ZEGERID OTC) 20-1100 MG CAPS Take 1 capsule by mouth at bedtime.     Historical Provider, MD  ondansetron (ZOFRAN) 8 MG tablet Take 1 tablet (8 mg total) by mouth every 12 (twelve) hours as  needed for nausea. 07/02/13   Costin Karlyne Greenspan, MD  OxyCODONE (OXYCONTIN) 15 mg T12A 12 hr tablet Take 1 tablet (15 mg total) by mouth every 12 (twelve) hours. 12/31/13   [name], MD  oxyCODONE-acetaminophen (PERCOCET) 10-325 MG per tablet Take 1 tablet by mouth every 6 (six) hours as needed for pain. 12/31/13   [name], MD  pregabalin (LYRICA) 100 MG capsule Take 100 mg by mouth 3 (three) times daily. 07/02/13   Costin Karlyne Greenspan, MD  silver sulfADIAZINE (SILVADENE) 1 % cream Apply topically daily. 12/25/13   Kinnie Feil, MD   BP 118/76  Pulse 94  Temp(Src) 99 F (37.2 C)  (Oral)  Resp 13  SpO2 99% Physical Exam  Nursing note and vitals reviewed. Constitutional: He appears well-developed and well-nourished. No distress.  HENT:  Head: Normocephalic and atraumatic.  Because membranes dry  Eyes: Conjunctivae and EOM are normal. Pupils are equal, round, and reactive to light.  Neck: Normal range of motion. Neck supple.  Cardiovascular: Normal rate, regular rhythm and normal heart sounds.   Abdominal: Soft. Bowel sounds are normal. He exhibits mass. There is no tenderness.  Palpable distended bladder  Musculoskeletal: Normal range of motion. He exhibits edema.  Bilateral of leg swelling with chronic venous stasis type changes. Dried circular wounds to both anterior shins. Some mild erythema. No purulent discharge. Both big toes with ulcers on the bottom some foul-smelling odor.  Neurological: He is alert. No cranial nerve deficit. He exhibits normal muscle tone. Coordination normal.  Drowsy but alert  Skin: Skin is warm. There is erythema.    ED Course  Procedures (including critical care time) Labs Review Labs Reviewed  CBC WITH DIFFERENTIAL - Abnormal; Notable for the following:    WBC 32.0 (*)    RBC 3.62 (*)    Hemoglobin 9.4 (*)    HCT 28.1 (*)    MCV 77.6 (*)    Platelets 462 (*)    Lymphocytes Relative 6 (*)    Band Neutrophils 39 (*)    Neutro Abs 28.5 (*)    Monocytes Absolute 1.6 (*)    All other components within normal limits  COMPREHENSIVE METABOLIC PANEL - Abnormal; Notable for the following:    Sodium 132 (*)    Chloride 95 (*)    Glucose, Bld 532 (*)    BUN 33 (*)    Creatinine, Ser 1.60 (*)    Albumin 1.8 (*)    Alkaline Phosphatase 179 (*)    Total Bilirubin 0.2 (*)    GFR calc non Af Amer 57 (*)    GFR calc Af Amer 66 (*)    All other components within normal limits  LIPASE, BLOOD - Abnormal; Notable for the following:    Lipase 10 (*)    All other components within normal limits  URINALYSIS, ROUTINE W REFLEX  MICROSCOPIC - Abnormal; Notable for the following:    Glucose, UA >1000 (*)    Hgb urine dipstick MODERATE (*)    Protein, ur 30 (*)    All other components within normal limits  URINE MICROSCOPIC-ADD ON - Abnormal; Notable for the following:    Bacteria, UA MANY (*)    Casts HYALINE CASTS (*)    All other components within normal limits  I-STAT VENOUS BLOOD GAS, ED - Abnormal; Notable for the following:    pH, Ven 7.365 (*)    pO2, Ven 28.0 (*)    Bicarbonate 25.9 (*)  All other components within normal limits  CBG MONITORING, ED - Abnormal; Notable for the following:    Glucose-Capillary 441 (*)    All other components within normal limits  CBG MONITORING, ED - Abnormal; Notable for the following:    Glucose-Capillary 360 (*)    All other components within normal limits  CBG MONITORING, ED - Abnormal; Notable for the following:    Glucose-Capillary 291 (*)    All other components within normal limits  URINE CULTURE  CULTURE, BLOOD (ROUTINE X 2)  CULTURE, BLOOD (ROUTINE X 2)  I-STAT CG4 LACTIC ACID, ED   Results for orders placed during the hospital encounter of 01/29/14  CBC WITH DIFFERENTIAL      Result Value Ref Range   WBC 32.0 (*) 4.0 - 10.5 K/uL   RBC 3.62 (*) 4.22 - 5.81 MIL/uL   Hemoglobin 9.4 (*) 13.0 - 17.0 g/dL   HCT 28.1 (*) 39.0 - 52.0 %   MCV 77.6 (*) 78.0 - 100.0 fL   MCH 26.0  26.0 - 34.0 pg   MCHC 33.5  30.0 - 36.0 g/dL   RDW 14.5  11.5 - 15.5 %   Platelets 462 (*) 150 - 400 K/uL   Neutrophils Relative % 49  43 - 77 %   Lymphocytes Relative 6 (*) 12 - 46 %   Monocytes Relative 5  3 - 12 %   Eosinophils Relative 0  0 - 5 %   Basophils Relative 0  0 - 1 %   Band Neutrophils 39 (*) 0 - 10 %   Metamyelocytes Relative 1     Myelocytes 0     Promyelocytes Absolute 0     Blasts 0     nRBC 0  0 /100 WBC   Neutro Abs 28.5 (*) 1.7 - 7.7 K/uL   Lymphs Abs 1.9  0.7 - 4.0 K/uL   Monocytes Absolute 1.6 (*) 0.1 - 1.0 K/uL   Eosinophils Absolute 0.0  0.0 -  0.7 K/uL   Basophils Absolute 0.0  0.0 - 0.1 K/uL   WBC Morphology INCREASED BANDS (>20% BANDS)    COMPREHENSIVE METABOLIC PANEL      Result Value Ref Range   Sodium 132 (*) 137 - 147 mEq/L   Potassium 4.4  3.7 - 5.3 mEq/L   Chloride 95 (*) 96 - 112 mEq/L   CO2 22  19 - 32 mEq/L   Glucose, Bld 532 (*) 70 - 99 mg/dL   BUN 33 (*) 6 - 23 mg/dL   Creatinine, Ser 1.60 (*) 0.50 - 1.35 mg/dL   Calcium 9.0  8.4 - 10.5 mg/dL   Total Protein 7.6  6.0 - 8.3 g/dL   Albumin 1.8 (*) 3.5 - 5.2 g/dL   AST 10  0 - 37 U/L   ALT 9  0 - 53 U/L   Alkaline Phosphatase 179 (*) 39 - 117 U/L   Total Bilirubin 0.2 (*) 0.3 - 1.2 mg/dL   GFR calc non Af Amer 57 (*) >90 mL/min   GFR calc Af Amer 66 (*) >90 mL/min  LIPASE, BLOOD      Result Value Ref Range   Lipase 10 (*) 11 - 59 U/L  URINALYSIS, ROUTINE W REFLEX MICROSCOPIC      Result Value Ref Range   Color, Urine YELLOW  YELLOW   APPearance CLEAR  CLEAR   Specific Gravity, Urine 1.023  1.005 - 1.030   pH 5.5  5.0 - 8.0   Glucose, UA >  1000 (*) NEGATIVE mg/dL   Hgb urine dipstick MODERATE (*) NEGATIVE   Bilirubin Urine NEGATIVE  NEGATIVE   Ketones, ur NEGATIVE  NEGATIVE mg/dL   Protein, ur 30 (*) NEGATIVE mg/dL   Urobilinogen, UA 0.2  0.0 - 1.0 mg/dL   Nitrite NEGATIVE  NEGATIVE   Leukocytes, UA NEGATIVE  NEGATIVE  URINE MICROSCOPIC-ADD ON      Result Value Ref Range   Squamous Epithelial / LPF RARE  RARE   WBC, UA 0-2  <3 WBC/hpf   RBC / HPF 7-10  <3 RBC/hpf   Bacteria, UA MANY (*) RARE   Casts HYALINE CASTS (*) NEGATIVE  I-STAT CG4 LACTIC ACID, ED      Result Value Ref Range   Lactic Acid, Venous 2.12  0.5 - 2.2 mmol/L  I-STAT VENOUS BLOOD GAS, ED      Result Value Ref Range   pH, Ven 7.365 (*) 7.250 - 7.300   pCO2, Ven 45.3  45.0 - 50.0 mmHg   pO2, Ven 28.0 (*) 30.0 - 45.0 mmHg   Bicarbonate 25.9 (*) 20.0 - 24.0 mEq/L   TCO2 27  0 - 100 mmol/L   O2 Saturation 50.0     Sample type VENOUS     Comment VALUES EXPECTED, NO REPEAT    CBG  MONITORING, ED      Result Value Ref Range   Glucose-Capillary 441 (*) 70 - 99 mg/dL  CBG MONITORING, ED      Result Value Ref Range   Glucose-Capillary 360 (*) 70 - 99 mg/dL   Comment 1 Notify RN     Comment 2 Documented in Chart    CBG MONITORING, ED      Result Value Ref Range   Glucose-Capillary 291 (*) 70 - 99 mg/dL     Imaging Review Ct Head Wo Contrast  01/29/2014   CLINICAL DATA:  Hyperglycemia.  Diabetes.  EXAM: CT HEAD WITHOUT CONTRAST  TECHNIQUE: Contiguous axial images were obtained from the base of the skull through the vertex without intravenous contrast.  COMPARISON:  None.  FINDINGS: Image quality degraded by mild motion.  Ventricle size is normal. Small hypodensity left frontal white matter, likely due to chronic ischemia. Negative for acute infarct, hemorrhage, or mass lesion. Calvarium intact. Visualized sinuses are clear.  IMPRESSION: Small hypodensity left frontal white matter, most consistent with mild chronic microvascular ischemia. No acute abnormality.   Electronically Signed   By: Franchot Gallo M.D.   On: 01/29/2014 11:16   Ct Lumbar Spine Wo Contrast  01/29/2014   CLINICAL DATA:  Chronic neuropathy. Diabetes. Bilateral leg numbness. Hyperglycemia.  EXAM: CT LUMBAR SPINE WITHOUT CONTRAST  TECHNIQUE: Multidetector CT imaging of the lumbar spine was performed without intravenous contrast administration. Multiplanar CT image reconstructions were also generated.  COMPARISON:  None.  FINDINGS: Negative for fracture or mass. Disc spaces are normal. No degenerative change. Facet joints are normal.  No soft tissue swelling. The psoas muscle and retroperitoneum are normal.  No evidence of spinal infection. MRI with contrast is more sensitive than CT for detection of spinal infection.  IMPRESSION: Normal   Electronically Signed   By: Franchot Gallo M.D.   On: 01/29/2014 11:28   Dg Chest Port 1 View  01/29/2014   CLINICAL DATA:  Lethargy, fatigue  EXAM: PORTABLE CHEST - 1 VIEW   COMPARISON:  10/02/2013  FINDINGS: Cardiac shadow is stable. The lungs are well aerated bilaterally. Mild patchy infiltrative density is noted in the right mid and  upper lung. No acute bony abnormality is noted. Chronic changes of the distal left clavicle are again seen.  IMPRESSION: Patchy infiltrate in the right mid and upper lung. Followup films are recommended.   Electronically Signed   By: Inez Catalina M.D.   On: 01/29/2014 09:16     EKG Interpretation   Date/Time:  Friday January 29 2014 09:30:03 EDT Ventricular Rate:  94 PR Interval:  122 QRS Duration: 82 QT Interval:  328 QTC Calculation: 410 R Axis:   64 Text Interpretation:  Normal sinus rhythm Normal ECG Confirmed by  Kiersten Coss  MD, Indiah Heyden ([id number]) on 01/29/2014 9:54:32 AM      CRITICAL CARE Performed by: Mervin Kung Total critical care time: 30 Critical care time was exclusive of separately billable procedures and treating other patients. Critical care was necessary to treat or prevent imminent or life-threatening deterioration. Critical care was time spent personally by me on the following activities: development of treatment plan with patient and/or surrogate as well as nursing, discussions with consultants, evaluation of patient's response to treatment, examination of patient, obtaining history from patient or surrogate, ordering and performing treatments and interventions, ordering and review of laboratory studies, ordering and review of radiographic studies, pulse oximetry and re-evaluation of patient's condition.      MDM   Final diagnoses:  Hyperglycemia  HCAP (healthcare-associated pneumonia)     Patient brought in by EMS drowsy but arousable and would answer questions. Patient appeared preseptal take low grade fever was not tachycardic blood pressure in the systolic range 469-629. Chest x-ray consistent with a right-sided pneumonia developing blood sugar was in the 500 range. Patient started on glucose  stabilizer patient was admitted in March to the hospital so patient started on healthcare acquired pneumonia protocol antibiotics. He was cultured prior to this. Patient had the statement of not being ill to move his legs since last evening at 8:00 had urinary retention with bladder had over a liter of fluid. Foley catheter drain that. Patient had CT of head which was negative CT of lumbar back which was negative. Patient known to have significant neuropathy and perhaps has no feeling in the legs not able to determine whether he can actually move him but he has chosen not to. In addition patient has diabetic type ulcers on his feet x-rays of the feet show no evidence of osteomyelitis. Source of infection includes pneumonia plus any of the leg sores. Patient's lactic acid was less than 4 but greater than 2 patient's systolic blood pressures have always been greater than 100. Patient's mentation has not changed. Discussed with hospitalist for metastatic down [place].  Patient may require an MRI of his lumbar back to ascertain the lower extremity motor weakness. Labs not consistent with DKA.  Mervin Kung, MD 01/29/14 [phone]

## 2014-01-29 NOTE — ED Notes (Signed)
Pt give orange juice.  

## 2014-01-29 NOTE — ED Notes (Signed)
Pending for [facility]. Report given to receiving RN

## 2014-01-29 NOTE — Progress Notes (Addendum)
Hypoglycemic Event  CBG: 64  Treatment: D50 amp  Symptoms: lethargic   Follow-up CBG: Time: 1910 CBG Result: 157  Possible Reasons for Event: on insulin gtt, infx, and inadequate intake .   Comments/MD notified: Dr. Coralyn Pear notified and new orders given. Will continue to monitor for changes.     Karen Kays  Remember to initiate Hypoglycemia Order Set & complete

## 2014-01-29 NOTE — ED Notes (Signed)
[facility] has been notified of room 3S11C and truck is coming directly from [facility] after transporting a patient, they will come to pick up this patient.

## 2014-01-29 NOTE — Progress Notes (Signed)
ANTIBIOTIC CONSULT NOTE - INITIAL  Pharmacy Consult for Vancomycin Indication: pneumonia  Allergies  Allergen Reactions  . Cefuroxime Axetil Anaphylaxis  . Penicillins Anaphylaxis    ?can take amoxicillin?  [name] [Benzonatate] Anaphylaxis  . Shellfish Allergy Itching    Patient Measurements:   Adjusted Body Weight:    Vital Signs: Temp: 99 F (37.2 C) (04/24 0943) Temp src: Oral (04/24 0943) BP: 118/76 mmHg (04/24 0943) Pulse Rate: 94 (04/24 0943) Intake/Output from previous day:   Intake/Output from this shift: Total I/O In: -  Out: 1600 [Urine:1600]  Labs:  Recent Labs  01/29/14 0858  WBC 32.0*  HGB 9.4*  PLT 462*  CREATININE 1.60*   The CrCl is unknown because both a height and weight (above a minimum accepted value) are required for this calculation. No results found for this basename: VANCOTROUGH, VANCOPEAK, VANCORANDOM, GENTTROUGH, GENTPEAK, GENTRANDOM, TOBRATROUGH, TOBRAPEAK, TOBRARND, AMIKACINPEAK, AMIKACINTROU, AMIKACIN,  in the last 72 hours   Microbiology: No results found for this or any previous visit (from the past 720 hour(s)).  Medical History: Past Medical History  Diagnosis Date  . Diabetes mellitus   . GERD (gastroesophageal reflux disease)   . Asthma   . Hx MRSA infection     on face  . Gastroparesis   . Diabetic neuropathy   . Seizures     Medications: see med rec  Assessment: 28 y/o M with type 1 DM presents with hyperglycemia >500. Multiple diabetic ulcers and sores on B LE. WBC 32. Scr has increased from 0.71>>1.6 in the last month (calculated CrCl 55.5). CXR with infiltrate. Hgb also low only 9.4.  Goal of Therapy:  Vancomycin trough level 15-20 mcg/ml  Plan:  Aztreonam 2g IV x 1 at [facility] Vancomycin 1g IV x 1 then 500mg  IV q12h. Vanco trough at steady state after 3-5 doses.   Monicka Cyran S. Alford Highland, PharmD, [facility] Clinical Staff Pharmacist Pager [phone]  [facility] Alford Highland 01/29/2014,9:46 AM

## 2014-01-29 NOTE — Progress Notes (Signed)
ANTIBIOTIC CONSULT NOTE - INITIAL  Pharmacy Consult for aztreonam Indication: HCAP and cellulitis  Allergies  Allergen Reactions  . Cefuroxime Axetil Anaphylaxis  . Penicillins Anaphylaxis    ?can take amoxicillin?  [name] [Benzonatate] Anaphylaxis  . Shellfish Allergy Itching    Patient Measurements: Height: 5' 7.75" (172.1 cm) Weight: 139 lb 15.9 oz (63.5 kg) IBW/kg (Calculated) : 67.83  Vital Signs: Temp: 99.1 F (37.3 C) (04/24 2015) Temp src: Oral (04/24 2015) BP: 95/80 mmHg (04/24 2015) Pulse Rate: 95 (04/24 2015) Intake/Output from previous day:   Intake/Output from this shift:    Labs:  Recent Labs  01/29/14 0858  WBC 32.0*  HGB 9.4*  PLT 462*  CREATININE 1.60*   Estimated Creatinine Clearance: 61.2 ml/min (by C-G formula based on Cr of 1.6). No results found for this basename: VANCOTROUGH, VANCOPEAK, VANCORANDOM, GENTTROUGH, GENTPEAK, GENTRANDOM, TOBRATROUGH, TOBRAPEAK, TOBRARND, AMIKACINPEAK, AMIKACINTROU, AMIKACIN,  in the last 72 hours   Microbiology: No results found for this or any previous visit (from the past 720 hour(s)).  Medical History: Past Medical History  Diagnosis Date  . Diabetes mellitus   . GERD (gastroesophageal reflux disease)   . Asthma   . Hx MRSA infection     on face  . Gastroparesis   . Diabetic neuropathy   . Seizures    Assessment: 28 y/o with type 1 diabetes who presented to [facility] with hyperglycemia now transferred to [facility]. He was started on vancomycin earlier today. Pharmacy now consulted to begin aztreonam (PCN allergy) for HCAP and cellulitis. SCr is elevated, WBC is elevated, he is febrile, and cultures are pending.  Goal of Therapy:  Eradication of infection  Plan:  - Aztreonam 1 g IV q8h - Vancomycin as previously dosed earlier - Monitor renal function, clinical course and culture data  [facility], Pharm.D., BCPS Clinical Pharmacist Pager: [phone] 01/29/2014 8:33 PM

## 2014-01-29 NOTE — ED Notes (Signed)
T1DM patient, present to the ED by EMS for hyperglycemia. According to EMS, patient's neuropathy was acting up and could hardly walk so family member put him to bed, and checked his blood sugar at 2 am and he was hyperglycemic at that time. Family member gave him 6 units of units of insulin. Upon EMS arrival, the CBG was 524.   On arrival, pt's CBG was 441, lethargic, fatigue, able to open eyes on command, able to briefly communicate with me, alert to self, situation, and location. Lungs are clear, Heart S1 S2, abdomen firm and distended, bowel sound present. Multiple diabetic ulcers and sores noted on.  bilateral lower extremities. VS stable. MD in room

## 2014-01-30 DIAGNOSIS — L03116 Cellulitis of left lower limb: Secondary | ICD-10-CM

## 2014-01-30 DIAGNOSIS — R7309 Other abnormal glucose: Secondary | ICD-10-CM

## 2014-01-30 DIAGNOSIS — E1169 Type 2 diabetes mellitus with other specified complication: Secondary | ICD-10-CM

## 2014-01-30 DIAGNOSIS — R4182 Altered mental status, unspecified: Secondary | ICD-10-CM | POA: Diagnosis present

## 2014-01-30 DIAGNOSIS — J069 Acute upper respiratory infection, unspecified: Secondary | ICD-10-CM

## 2014-01-30 DIAGNOSIS — J690 Pneumonitis due to inhalation of food and vomit: Secondary | ICD-10-CM

## 2014-01-30 DIAGNOSIS — L089 Local infection of the skin and subcutaneous tissue, unspecified: Secondary | ICD-10-CM

## 2014-01-30 DIAGNOSIS — G373 Acute transverse myelitis in demyelinating disease of central nervous system: Secondary | ICD-10-CM | POA: Diagnosis present

## 2014-01-30 DIAGNOSIS — R29898 Other symptoms and signs involving the musculoskeletal system: Secondary | ICD-10-CM | POA: Diagnosis present

## 2014-01-30 DIAGNOSIS — B370 Candidal stomatitis: Secondary | ICD-10-CM

## 2014-01-30 DIAGNOSIS — L03119 Cellulitis of unspecified part of limb: Secondary | ICD-10-CM

## 2014-01-30 DIAGNOSIS — L03115 Cellulitis of right lower limb: Secondary | ICD-10-CM | POA: Diagnosis present

## 2014-01-30 DIAGNOSIS — L02419 Cutaneous abscess of limb, unspecified: Secondary | ICD-10-CM

## 2014-01-30 DIAGNOSIS — G0489 Other myelitis: Secondary | ICD-10-CM

## 2014-01-30 LAB — GLUCOSE, CAPILLARY
GLUCOSE-CAPILLARY: 175 mg/dL — AB (ref 70–99)
GLUCOSE-CAPILLARY: 207 mg/dL — AB (ref 70–99)
GLUCOSE-CAPILLARY: 211 mg/dL — AB (ref 70–99)
GLUCOSE-CAPILLARY: 223 mg/dL — AB (ref 70–99)
Glucose-Capillary: 207 mg/dL — ABNORMAL HIGH (ref 70–99)
Glucose-Capillary: 253 mg/dL — ABNORMAL HIGH (ref 70–99)
Glucose-Capillary: 238 mg/dL — ABNORMAL HIGH (ref 70–99)

## 2014-01-30 LAB — RHEUMATOID FACTOR

## 2014-01-30 LAB — COMPREHENSIVE METABOLIC PANEL
ALK PHOS: 171 U/L — AB (ref 39–117)
ALT: 7 U/L (ref 0–53)
AST: 9 U/L (ref 0–37)
Albumin: 1.5 g/dL — ABNORMAL LOW (ref 3.5–5.2)
BUN: 20 mg/dL (ref 6–23)
CALCIUM: 8.5 mg/dL (ref 8.4–10.5)
CO2: 21 mEq/L (ref 19–32)
Chloride: 104 mEq/L (ref 96–112)
Creatinine, Ser: 0.94 mg/dL (ref 0.50–1.35)
GFR calc non Af Amer: >90 mL/min (ref >90)
GLUCOSE: 227 mg/dL — AB (ref 70–99)
Potassium: 4.2 mEq/L (ref 3.7–5.3)
SODIUM: 138 meq/L (ref 137–147)
Total Bilirubin: 0.3 mg/dL (ref 0.3–1.2)
Total Protein: 6.7 g/dL (ref 6.0–8.3)

## 2014-01-30 LAB — CBC WITH DIFFERENTIAL/PLATELET
BASOS ABS: 0 10*3/uL (ref 0.0–0.1)
Basophils Relative: 0 % (ref 0–1)
EOS ABS: 0 10*3/uL (ref 0.0–0.7)
Eosinophils Relative: 0 % (ref 0–5)
HCT: 25.6 % — ABNORMAL LOW (ref 39.0–52.0)
HEMOGLOBIN: 8.4 g/dL — AB (ref 13.0–17.0)
LYMPHS PCT: 10 % — AB (ref 12–46)
Lymphs Abs: 3.4 10*3/uL (ref 0.7–4.0)
MCH: 25.2 pg — ABNORMAL LOW (ref 26.0–34.0)
MCHC: 32.8 g/dL (ref 30.0–36.0)
MCV: 76.9 fL — ABNORMAL LOW (ref 78.0–100.0)
Monocytes Absolute: 2 10*3/uL — ABNORMAL HIGH (ref 0.1–1.0)
Monocytes Relative: 6 % (ref 3–12)
NEUTROS PCT: 84 % — AB (ref 43–77)
Neutro Abs: 28.2 10*3/uL — ABNORMAL HIGH (ref 1.7–7.7)
Platelets: 434 10*3/uL — ABNORMAL HIGH (ref 150–400)
RBC: 3.33 MIL/uL — ABNORMAL LOW (ref 4.22–5.81)
RDW: 14.5 % (ref 11.5–15.5)
WBC Morphology: INCREASED
WBC: 33.6 10*3/uL — ABNORMAL HIGH (ref 4.0–10.5)

## 2014-01-30 LAB — GRAM STAIN

## 2014-01-30 LAB — BLOOD GAS, ARTERIAL
Acid-Base Excess: 1.2 mmol/L (ref 0.0–2.0)
Acid-Base Excess: 1.4 mmol/L (ref 0.0–2.0)
BICARBONATE: 25.2 meq/L — AB (ref 20.0–24.0)
BICARBONATE: 25.4 meq/L — AB (ref 20.0–24.0)
Drawn by: 12971
Drawn by: 29017
FIO2: 0.21 %
O2 Content: 3 L/min
O2 SAT: 97.4 %
O2 Saturation: 87 %
PATIENT TEMPERATURE: 98.6
PCO2 ART: 40.2 mmHg (ref 35.0–45.0)
PH ART: 7.417 (ref 7.350–7.450)
PO2 ART: 52.6 mmHg — AB (ref 80.0–100.0)
Patient temperature: 98.6
TCO2: 26.4 mmol/L (ref 0–100)
TCO2: 26.7 mmol/L (ref 0–100)
pCO2 arterial: 38.9 mmHg (ref 35.0–45.0)
pH, Arterial: 7.426 (ref 7.350–7.450)
pO2, Arterial: 96.3 mmHg (ref 80.0–100.0)

## 2014-01-30 LAB — CSF CELL COUNT WITH DIFFERENTIAL
RBC COUNT CSF: 3 /mm3 — AB
Tube #: 1
WBC, CSF: 2 /mm3 (ref 0–5)

## 2014-01-30 LAB — RPR

## 2014-01-30 LAB — URINE CULTURE
Colony Count: NO GROWTH
Culture: NO GROWTH

## 2014-01-30 LAB — C-REACTIVE PROTEIN
CRP: 29.4 mg/dL — AB (ref <0.60)
CRP: 26.1 mg/dL — ABNORMAL HIGH (ref <0.60)

## 2014-01-30 LAB — SEDIMENTATION RATE: SED RATE: 102 mm/h — AB (ref 0–16)

## 2014-01-30 LAB — MRSA PCR SCREENING: MRSA by PCR: NEGATIVE

## 2014-01-30 MED ORDER — LIDOCAINE 5 % EX PTCH
1.0000 | MEDICATED_PATCH | CUTANEOUS | Status: DC
  Administered 2014-01-30 – 2014-02-05 (×7): 1 via TRANSDERMAL
  Filled 2014-01-30 (×9): qty 1

## 2014-01-30 MED ORDER — CHLORHEXIDINE GLUCONATE 0.12 % MT SOLN
15.0000 mL | Freq: Two times a day (BID) | OROMUCOSAL | Status: DC
  Administered 2014-01-30 – 2014-02-05 (×10): 15 mL via OROMUCOSAL
  Filled 2014-01-30 (×14): qty 15

## 2014-01-30 MED ORDER — FLUCONAZOLE 100MG IVPB
100.0000 mg | INTRAVENOUS | Status: DC
  Administered 2014-01-30 – 2014-02-02 (×4): 100 mg via INTRAVENOUS
  Filled 2014-01-30 (×5): qty 50

## 2014-01-30 MED ORDER — FENTANYL CITRATE 0.05 MG/ML IJ SOLN
12.5000 ug | INTRAMUSCULAR | Status: DC | PRN
  Administered 2014-01-30: 25 ug via INTRAVENOUS
  Filled 2014-01-30: qty 2

## 2014-01-30 MED ORDER — INSULIN ASPART 100 UNIT/ML ~~LOC~~ SOLN
0.0000 [IU] | Freq: Four times a day (QID) | SUBCUTANEOUS | Status: DC
  Administered 2014-01-30: 5 [IU] via SUBCUTANEOUS

## 2014-01-30 MED ORDER — FENTANYL CITRATE 0.05 MG/ML IJ SOLN
50.0000 ug | INTRAMUSCULAR | Status: DC | PRN
  Administered 2014-01-31 (×3): 50 ug via INTRAVENOUS
  Filled 2014-01-30 (×3): qty 2

## 2014-01-30 MED ORDER — INSULIN ASPART 100 UNIT/ML ~~LOC~~ SOLN
0.0000 [IU] | SUBCUTANEOUS | Status: DC
  Administered 2014-01-30 (×2): 3 [IU] via SUBCUTANEOUS
  Administered 2014-01-31 (×2): 2 [IU] via SUBCUTANEOUS

## 2014-01-30 MED ORDER — INSULIN ASPART 100 UNIT/ML ~~LOC~~ SOLN: 0.0000 [IU] | Freq: Every day | SUBCUTANEOUS | Status: DC

## 2014-01-30 MED ORDER — LEVOFLOXACIN IN D5W 500 MG/100ML IV SOLN
500.0000 mg | INTRAVENOUS | Status: DC
  Administered 2014-01-30 – 2014-01-31 (×2): 500 mg via INTRAVENOUS
  Filled 2014-01-30 (×3): qty 100

## 2014-01-30 MED ORDER — OXYCODONE HCL 5 MG PO TABS: 5.0000 mg | ORAL_TABLET | ORAL | Status: DC | PRN

## 2014-01-30 MED ORDER — DEXTROSE 5 % IV SOLN
640.0000 mg | Freq: Three times a day (TID) | INTRAVENOUS | Status: DC
  Administered 2014-01-30 – 2014-02-03 (×12): 640 mg via INTRAVENOUS
  Filled 2014-01-30 (×14): qty 12.8

## 2014-01-30 MED ORDER — METHOCARBAMOL 100 MG/ML IJ SOLN
500.0000 mg | Freq: Three times a day (TID) | INTRAVENOUS | Status: DC | PRN
  Administered 2014-01-30 – 2014-02-01 (×3): 500 mg via INTRAVENOUS
  Filled 2014-01-30 (×5): qty 5

## 2014-01-30 NOTE — Procedures (Signed)
LP Procedure Note:  Patient has been seen and examined.  Chart has been reviewed.  LP is being performed to rule out CNS infection.  Procedure has been explained to patient/family including risks and benefits.  Consent has been signed by patient/family and witnessed.   Blood pressure 121/77, pulse 93, temperature 99.2 F (37.3 C), temperature source Oral, resp. rate 20, height 5' 7.5" (1.715 m), weight 64.184 kg (141 lb 8 oz), SpO2 99.00%.  Current facility-administered medications:0.9 %  sodium chloride infusion, , Intravenous, Continuous, Berle Mull, MD, Last Rate: 100 mL/hr at 01/30/14 1700;  acetaminophen (TYLENOL) suppository 650 mg, 650 mg, Rectal, Q6H PRN, Berle Mull, MD;  acetaminophen (TYLENOL) tablet 650 mg, 650 mg, Oral, Q6H PRN, Berle Mull, MD acyclovir (ZOVIRAX) 640 mg in dextrose 5 % 100 mL IVPB, 640 mg, Intravenous, Q8H, Rolla Flatten, RPH, 640 mg at 01/30/14 1638;  aztreonam (AZACTAM) 1 g in dextrose 5 % 50 mL IVPB, 1 g, Intravenous, 3 times per day, [facility], RPH, 1 g at 01/30/14 2119;  chlorhexidine (PERIDEX) 0.12 % solution 15 mL, 15 mL, Mouth Rinse, BID, Rush Farmer, MD, 15 mL at 01/30/14 2037 fentaNYL (SUBLIMAZE) injection 50 mcg, 50 mcg, Intravenous, Q4H PRN, Melvia Heaps, MD;  fluconazole (DIFLUCAN) IVPB 100 mg, 100 mg, Intravenous, Q24H, Campbell Riches, MD, 100 mg at 01/30/14 1433;  insulin aspart (novoLOG) injection 0-9 Units, 0-9 Units, Subcutaneous, 6 times per day, Erick Colace, NP, 3 Units at 01/30/14 2037;  levofloxacin (LEVAQUIN) IVPB 500 mg, 500 mg, Intravenous, Q24H, Campbell Riches, MD, 500 mg at 01/30/14 1333 lidocaine (LIDODERM) 5 % 1 patch, 1 patch, Transdermal, Q24H, Berle Mull, MD, 1 patch at 01/30/14 [phone];  methocarbamol (ROBAXIN) 500 mg in dextrose 5 % 50 mL IVPB, 500 mg, Intravenous, Q8H PRN, Melvia Heaps, MD;  ondansetron (ZOFRAN) 8 mg/NS 50 ml IVPB, 8 mg, Intravenous, Q6H PRN, Berle Mull, MD;  ondansetron (ZOFRAN)  tablet 4 mg, 4 mg, Oral, Q6H PRN, Berle Mull, MD sodium chloride 0.9 % injection 3 mL, 3 mL, Intravenous, Q12H, Berle Mull, MD, 3 mL at 01/30/14 2119;  vancomycin (VANCOCIN) 500 mg in sodium chloride 0.9 % 100 mL IVPB, 500 mg, Intravenous, Q12H, [place], [place], 500 mg at 01/30/14 2155   Recent Labs  01/29/14 0858 01/29/14 2113 01/30/14 0428  WBC 32.0*  --  33.6*  HGB 9.4*  --  8.4*  HCT 28.1*  --  25.6*  PLT 462*  --  434*  INR  --  1.12  --     CT of head:  Patient was placed in the lateral decub/sitting position.  Area was cleaned with betadine and anesthetized with lidocaine.  Under sterile conditions 20G LP needle was placed at approximately L3-4 without difficulty.  Opening pressure was documented at 12.  Approximately 18cc of clear and colorless fluid was obtained and sent for studies.  No complications were noted.    Alexis Goodell, MD [facility] [phone] 01/30/2014  10:44 PM

## 2014-01-30 NOTE — Progress Notes (Signed)
ANTIBIOTIC CONSULT NOTE - INITIAL  Pharmacy Consult for Acyclovir Indication: r/o Transverse myelitits d/t VZV  Allergies  Allergen Reactions  . Cefuroxime Axetil Anaphylaxis  . Penicillins Anaphylaxis    ?can take amoxicillin?  [name] [Benzonatate] Anaphylaxis  . Shellfish Allergy Itching    Patient Measurements: Height: 5' 7.5" (171.5 cm) Weight: 141 lb 8 oz (64.184 kg) IBW/kg (Calculated) : 67.25  Vital Signs: Temp: 98.3 F (36.8 C) (04/25 1242) Temp src: Axillary (04/25 1242) BP: 130/84 mmHg (04/25 1300) Pulse Rate: 99 (04/25 1300) Intake/Output from previous day: 04/24 0701 - 04/25 0700 In: 1475 [I.V.:1325; IV Piggyback:150] Out: 2700 [Urine:2700] Intake/Output from this shift: Total I/O In: 800 [I.V.:600; IV Piggyback:200] Out: 465 [Urine:465]  Labs:  Recent Labs  01/29/14 0858 01/29/14 2113 01/30/14 0428  WBC 32.0*  --  33.6*  HGB 9.4*  --  8.4*  PLT 462*  --  434*  CREATININE 1.60* 0.98 0.94   Estimated Creatinine Clearance: 105.3 ml/min (by C-G formula based on Cr of 0.94). No results found for this basename: VANCOTROUGH, VANCOPEAK, VANCORANDOM, [place], [place], GENTRANDOM, [place], TOBRAPEAK, TOBRARND, AMIKACINPEAK, AMIKACINTROU, AMIKACIN,  in the last 72 hours   Microbiology: Recent Results (from the past 720 hour(s))  CULTURE, BLOOD (ROUTINE X 2)     Status: None   Collection Time    01/29/14  9:15 AM      Result Value Ref Range Status   Specimen Description BLOOD LEFT ARM   Final   Special Requests BOTTLES DRAWN AEROBIC AND ANAEROBIC 5CC EACH   Final   Culture  Setup Time     Final   Value: 01/29/2014 14:12     Performed at [facility]   Culture     Final   Value:        BLOOD CULTURE RECEIVED NO GROWTH TO DATE CULTURE WILL BE HELD FOR 5 DAYS BEFORE ISSUING A FINAL NEGATIVE REPORT     Performed at [facility]   Report Status PENDING   Incomplete  URINE CULTURE     Status: None   Collection Time    01/29/14   9:28 AM      Result Value Ref Range Status   Specimen Description URINE, CATHETERIZED   Final   Special Requests NONE   Final   Culture  Setup Time     Final   Value: 01/29/2014 14:22     Performed at [place]     Final   Value: NO GROWTH     Performed at [facility]   Culture     Final   Value: NO GROWTH     Performed at [facility]   Report Status 01/30/2014 FINAL   Final  CULTURE, BLOOD (ROUTINE X 2)     Status: None   Collection Time    01/29/14  9:35 AM      Result Value Ref Range Status   Specimen Description BLOOD RIGHT ARM   Final   Special Requests BOTTLES DRAWN AEROBIC AND ANAEROBIC 5CC BOTH   Final   Culture  Setup Time     Final   Value: 01/29/2014 14:12     Performed at [facility]   Culture     Final   Value:        BLOOD CULTURE RECEIVED NO GROWTH TO DATE CULTURE WILL BE HELD FOR 5 DAYS BEFORE ISSUING A FINAL NEGATIVE REPORT     Performed at  [facility]   Report Status PENDING   Incomplete    Medical History: Past Medical History  Diagnosis Date  . Diabetes mellitus   . GERD (gastroesophageal reflux disease)   . Asthma   . Hx MRSA infection     on face  . Gastroparesis   . Diabetic neuropathy   . 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 ID is on board and asked pharmacy to dose Acyclovir while ruling out VZV as the cause. Wt: 64.2 kg, SCr 0.94, CrCl~100 ml/min.   Goal of Therapy:  Proper antibiotics for infection/cultures adjusted for renal/hepatic function   Plan:  1. Acyclovir 640 mg (10 mg/kg)  IV every 8 hours 2. Will continue to follow renal function, culture results, LOT, and antibiotic de-escalation plans   Alycia Rossetti, PharmD, BCPS Clinical Pharmacist Pager: [phone] 01/30/2014 2:30 PM

## 2014-01-30 NOTE — Consult Note (Addendum)
PULMONARY / CRITICAL CARE MEDICINE   Name: Jason [name] MRN: [id number] DOB: 1986-01-16    ADMISSION DATE:  01/29/2014 CONSULTATION DATE:  4/25  REFERRING MD :  Clementeen Graham  PRIMARY SERVICE: [facility]>>>PCCM   CHIEF COMPLAINT:  Sepsis/ MS change   BRIEF PATIENT DESCRIPTION:  28 year old male w/ sig h/o DM, DM related neuropathy, diabetic ulcers (which had been worse over the week prior to admit), and gastroparesis,  admitted on 4/24 w/ CC: leg weakness. He was admitted w/ working dx of bilateral LE cellulitis, possible PNA, bilateral leg weakness, Acute on chronic renal injury and hyperglycemia. MRI of the brain/ C spine obtained showed Chiari I and some cord enlargement that may be a syrinx vs a TM. Treatment to date had included IVFs, Insulin gtt which was c/b hypoglycemia, and empiric abx. PCCM was consulted on 4/25 given concern that the worsening lethargy and progressive upper extremity weakness.    SIGNIFICANT EVENTS / STUDIES:  MRI brain 4/24 1. No acute intracranial abnormality identified. 2. 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 MRA BRAIN 4/24: 1. Limited study due to motion. 2. No proximal branch occlusion or definite high-grade flow-limiting stenosis within the intracranial circulation. MRI C spine 4/24: 1. T2 hyperintense intra medullary cystic lesion extending from the C4 through T1-2 level as above. 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 Given the history of new onset flaccid paralysis, possible transverse myelitis could also be considered HIV RNA 4/25>>> Lyme 4/25>>> RPR 4/25>>> ANA 4/25>>> ANCA 4/25>>> RF 4/25>>> ESR 4/25>>> CRP 4/25>>>   LINES / TUBES:   CULTURES: BCX2 4/24>>> UC 4/24>>>  ANTIBIOTICS: vanc 4/24>>> azactam 4/24>>> levaquin 4/25>>> Acyclovir 4/25>>> fluc 4/25>>>  HISTORY OF PRESENT ILLNESS:   28 year old male w/ sig h/o DM, DM related neuropathy, diabetic  ulcers (which had been worse over the week prior to admit), and gastroparesis,  admitted on 4/24 w/ CC: leg weakness. Was in usual state of health until 4/23. Took a nap around 4 pm, when he awoke at 8pm could not feel his legs. Went back to sleep, when awoke checked glucose which read as "high" took prescribed insulin, went back to sleep. Awoke the am of 4/24 unable to move legs at all. On arrival to ER he was somnolent, hyperglycemic w/ WBC 32K, cr 1.6, gluc 532. He was admitted w/ working dx of bilateral LE cellulitis, possible PNA, bilateral leg weakness, Acute on chronic renal injury and hyperglycemia. He was started on IVFs, culture data was sent and he was initiated on empiric antibiotics. He underwent MR imaging of brain and C spine: Chiari 1 malformation noted, better evaluated on concomitant MRI of the brain. T2 hyperintense intra  medullary lesion within the cervical spinal cord extending from the level of C4 inferiorly through the T1-2 intervertebral disc space is compatible with an associated syrinx. This measures approximately in 1 cm in greatest transaxial diameter at the level of C6-7. MR brain: 1. No acute intracranial abnormality identified. 2. 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 Neurology was consulted and noted concern about some cord enlargement which might reflect either a syrinx vs Transverse myelitis. MRI findings were discussed w/ neuro-surg (kritzer), who felt it was likely inflammatory and intramedullary so there is no role for surgical intervention. PCCM was called on 4/25 given med service concern about progressive lethargy and risk for clinical deterioration.  PAST MEDICAL HISTORY :  Past Medical History  Diagnosis Date  . Diabetes mellitus   . GERD (gastroesophageal reflux disease)   . Asthma   . Hx MRSA infection     on face  . Gastroparesis   . Diabetic neuropathy   . Seizures    Past Surgical History   Procedure Laterality Date  . Tonsillectomy     Prior to Admission medications   Medication Sig Start Date End Date Taking? Authorizing Provider  dexlansoprazole (DEXILANT) 60 MG capsule Take 1 capsule (60 mg total) by mouth daily. 12/24/13  Yes Jerene Bears, MD  doxycycline (VIBRA-TABS) 100 MG tablet Take 1 tablet (100 mg total) by mouth every 12 (twelve) hours. 12/25/13  Yes Kinnie Feil, MD  insulin aspart (NOVOLOG) 100 UNIT/ML injection Inject 1-17 Units into the skin 4 (four) times daily. Per sliding scale 12/25/13  Yes Kinnie Feil, MD  insulin glargine (LANTUS) 100 UNIT/ML injection Inject 0.2 mLs (20 Units total) into the skin daily. 12/26/13  Yes Kinnie Feil, MD  metoCLOPramide (REGLAN) 10 MG/10ML SOLN Take 10 mg by mouth 3 (three) times daily as needed for nausea or vomiting. 07/02/13  Yes Costin Karlyne Greenspan, MD  morphine (AVINZA) 60 MG 24 hr capsule Take 60 mg by mouth daily.   Yes Historical Provider, MD  Omeprazole-Sodium Bicarbonate (ZEGERID OTC) 20-1100 MG CAPS Take 1 capsule by mouth at bedtime as needed (for heartburn when out of Dexilant).    Yes Historical Provider, MD  ondansetron (ZOFRAN) 8 MG tablet Take 1 tablet (8 mg total) by mouth every 12 (twelve) hours as needed for nausea. 07/02/13  Yes Costin Karlyne Greenspan, MD  OxyCODONE (OXYCONTIN) 15 mg T12A 12 hr tablet Take 1 tablet (15 mg total) by mouth every 12 (twelve) hours. 12/31/13  Yes [name], MD  oxyCODONE-acetaminophen (PERCOCET) 10-325 MG per tablet Take 1 tablet by mouth every 6 (six) hours as needed for pain. 12/31/13  Yes [name], MD  pregabalin (LYRICA) 100 MG capsule Take 100 mg by mouth 3 (three) times daily. 07/02/13  Yes Costin Karlyne Greenspan, MD  silver sulfADIAZINE (SILVADENE) 1 % cream Apply topically daily. 12/25/13  Yes Kinnie Feil, MD   Allergies  Allergen Reactions  . Cefuroxime Axetil Anaphylaxis  . Penicillins Anaphylaxis    ?can take amoxicillin?  [name] [Benzonatate] Anaphylaxis  .  Shellfish Allergy Itching    FAMILY HISTORY:  Family History  Problem Relation Age of Onset  . Diabetes Father   . Hypertension Father   . Asthma      fhx  . Hypertension      fhx  . Stroke      fhx  . Heart disease Mother    SOCIAL HISTORY:  reports that he has been smoking Cigars.  He has never used smokeless tobacco. He reports that he drinks alcohol. He reports that he does not use illicit drugs.  REVIEW OF SYSTEMS:   Bolds are positive  Constitutional: weight loss, gain, night sweats, Fevers, chills, fatigue .  HEENT: headaches, Sore throat, sneezing, nasal congestion, post nasal drip, Difficulty swallowing, Tooth/dental problems, visual complaints visual changes, ear ache CV:  chest pain, radiates: ,Orthopnea, PND, swelling in lower extremities, dizziness, palpitations, syncope.  GI  heartburn, indigestion, abdominal pain, nausea, vomiting, diarrhea, change in bowel habits, loss of appetite, bloody stools.  Resp: cough, productive: , hemoptysis, dyspnea, chest pain, pleuritic.  Skin: rash or itching or icterus GU: dysuria, change in  color of urine, urgency or frequency. flank pain, hematuria  MS: joint pain or swelling. decreased range of motion  Psych: change in mood or affect. depression or anxiety.  Neuro: difficulty with speech, weakness, numbness, ataxia marked neck pain and decreased ROM   SUBJECTIVE:  C/o neck pain, Bilateral LE weakness and now progressive UE weakness  VITAL SIGNS: Temp:  [98.9 F (37.2 C)-99.9 F (37.7 C)] 99.2 F (37.3 C) (04/25 1114) Pulse Rate:  [89-102] 102 (04/25 0804) Resp:  [15-31] 31 (04/25 0804) BP: (95-119)/(63-80) 115/80 mmHg (04/25 0803) SpO2:  [95 %-100 %] 95 % (04/25 0804) Weight:  [63.5 kg (139 lb 15.9 oz)-64.184 kg (141 lb 8 oz)] 64.184 kg (141 lb 8 oz) (04/25 0604) HEMODYNAMICS:   VENTILATOR SETTINGS:   INTAKE / OUTPUT: Intake/Output     04/24 0701 - 04/25 0700 04/25 0701 - 04/26 0700   I.V. (mL/kg) 1325 (20.6)     IV Piggyback 150    Total Intake(mL/kg) 1475 (23)    Urine (mL/kg/hr) 2700 215 (0.7)   Total Output 2700 215   Net -1225 -215          PHYSICAL EXAMINATION: General:  Chronically ill appearing 28 year male looks much older than stated age Neuro:  Can't move LEs, progressive upper extremity gross motor str only  HEENT:  Poor dentition  Cardiovascular:  rrr Lungs:  Crackles RLL  Abdomen:  Soft, non-tender + bowel sounds  Musculoskeletal:  Unable to move UEs, gross motor uppers only.  Skin:  LE ulcer dressings intact   LABS:  CBC  Recent Labs Lab 01/29/14 0858 01/30/14 0428  WBC 32.0* 33.6*  HGB 9.4* 8.4*  HCT 28.1* 25.6*  PLT 462* 434*   Coag's  Recent Labs Lab 01/29/14 2113  INR 1.12   BMET  Recent Labs Lab 01/29/14 0858 01/29/14 2113 01/30/14 0428  NA 132* 138 138  K 4.4 3.9 4.2  CL 95* 103 104  CO2 22 22 21   BUN 33* 22 20  CREATININE 1.60* 0.98 0.94  GLUCOSE 532* 136* 227*   Electrolytes  Recent Labs Lab 01/29/14 0858 01/29/14 2113 01/30/14 0428  CALCIUM 9.0 8.4 8.5  MG  --  1.7  --   PHOS  --  3.1  --    Sepsis Markers  Recent Labs Lab 01/29/14 0918  LATICACIDVEN 2.12   ABG  Recent Labs Lab 01/29/14 2350 01/30/14 0829  PHART 7.417 7.426  PCO2ART 40.2 38.9  PO2ART 52.6* 96.3   Liver Enzymes  Recent Labs Lab 01/29/14 0858 01/29/14 2113 01/30/14 0428  AST 10 12 9   ALT 9 8 7   ALKPHOS 179* 190* 171*  BILITOT 0.2* 0.3 0.3  ALBUMIN 1.8* 1.5* 1.5*   Cardiac Enzymes  Recent Labs Lab 01/29/14 2113  PROBNP 1343.0*   Glucose  Recent Labs Lab 01/29/14 1913 01/29/14 2014 01/29/14 2344 01/30/14 0206 01/30/14 0412 01/30/14 0608  GLUCAP 157* 147* 175* 207* 223* 253*    Imaging Ct Head Wo Contrast  01/29/2014   CLINICAL DATA:  Hyperglycemia.  Diabetes.  EXAM: CT HEAD WITHOUT CONTRAST  TECHNIQUE: Contiguous axial images were obtained from the base of the skull through the vertex without intravenous contrast.   COMPARISON:  None.  FINDINGS: Image quality degraded by mild motion.  Ventricle size is normal. Small hypodensity left frontal white matter, likely due to chronic ischemia. Negative for acute infarct, hemorrhage, or mass lesion. Calvarium intact. Visualized sinuses are clear.  IMPRESSION: Small hypodensity left frontal white matter, most  consistent with mild chronic microvascular ischemia. No acute abnormality.   Electronically Signed   By: Franchot Gallo M.D.   On: 01/29/2014 11:16   Ct Lumbar Spine Wo Contrast  01/29/2014   CLINICAL DATA:  Chronic neuropathy. Diabetes. Bilateral leg numbness. Hyperglycemia.  EXAM: CT LUMBAR SPINE WITHOUT CONTRAST  TECHNIQUE: Multidetector CT imaging of the lumbar spine was performed without intravenous contrast administration. Multiplanar CT image reconstructions were also generated.  COMPARISON:  None.  FINDINGS: Negative for fracture or mass. Disc spaces are normal. No degenerative change. Facet joints are normal.  No soft tissue swelling. The psoas muscle and retroperitoneum are normal.  No evidence of spinal infection. MRI with contrast is more sensitive than CT for detection of spinal infection.  IMPRESSION: Normal   Electronically Signed   By: Franchot Gallo M.D.   On: 01/29/2014 11:28   Mr Brain Wo Contrast  01/30/2014   CLINICAL DATA:  New onset flaccid paralysis.  EXAM: MRI HEAD WITHOUT CONTRAST  MRA HEAD WITHOUT CONTRAST  MRI CERVICAL SPINE WITHOUT CONTRAST  TECHNIQUE: Multiplanar, multiecho pulse sequences of the brain and surrounding structures were obtained without intravenous contrast. Angiographic images of the head were obtained using MRA technique without contrast.  COMPARISON:  None.  FINDINGS: MRI HEAD FINDINGS:  Study is degraded by motion artifact.  The CSF containing spaces are within normal limits for patient age. A few scattered T2/FLAIR hyperintense foci seen within the periventricular and deep white matter of both cerebral hemispheres, nonspecific,  and of doubtful clinical significance. No mass lesion, midline shift, or extra-axial fluid collection. Ventricles are normal in size without evidence of hydrocephalus.  No diffusion-weighted signal abnormality is identified to suggest acute intracranial infarct. Gray-white matter differentiation is maintained. Normal flow voids are seen within the intracranial vasculature. No intracranial hemorrhage identified.  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 There compensatory mild beaking of the fourth ventricle.  Pituitary gland is within normal limits. Pituitary stalk is midline. The globes and optic nerves demonstrate a normal appearance with normal signal intensity. The  The bone marrow signal intensity is normal. Calvarium is intact. Visualized upper cervical spine is within normal limits.  Scalp soft tissues are unremarkable.  Paranasal sinuses are clear.  No mastoid effusion.  MRA HEAD FINDINGS:  Study is markedly limited due to motion artifact.  Anterior circulation: Normal flow related enhancement of the included cervical, petrous, cavernous and supra clinoid internal carotid arteries. Patent anterior communicating artery. Normal flow related enhancement of the anterior and middle cerebral arteries, including more distal segments.  Posterior circulation: Left vertebral artery is dominant. Basilar artery is patent, with normal flow related enhancement of the main branch vessels. Normal flow related enhancement of the posterior cerebral arteries.  No large vessel occlusion, hemodynamically significant stenosis, abnormal luminal irregularity, aneurysm within the anterior nor posterior circulation.  MRI CERVICAL SPINE:  Study is limited by motion artifact.  There is gentle reversal of the normal cervical lordosis, likely related to patient positioning. Chiari 1 malformation noted, better evaluated on concomitant MRI of the  brain. T2 hyperintense intra medullary lesion within the cervical spinal cord extending from the level of C4 inferiorly through the T1-2 intervertebral disc space is compatible with an associated syrinx. This measures approximately in 1 cm in greatest transaxial diameter at the level of C6-7. No definite associated mass lesion. Otherwise, signal intensity within the cervical spinal cord is normal.  Signal intensity within the  vertebral body bone marrow is within normal limits.  No significant degenerative disc bulge or disc protrusion identified within the cervical spine. No canal or neural foraminal stenosis.  Visualized paraspinous soft tissues are within normal limits.  IMPRESSION: MRI BRAIN:  1. No acute intracranial abnormality identified. 2. 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  MRA BRAIN:  1. Limited study due to motion. 2. No proximal branch occlusion or definite high-grade flow-limiting stenosis within the intracranial circulation.  MRI CERVICAL SPINE:  1. T2 hyperintense intra medullary cystic lesion extending from the C4 through T1-2 level as above. 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 Further imaging with post-contrast MRI of the cervical spine to evaluate for possible enhancement may be helpful for further evaluation. 2. No significant degenerative disc disease within the cervical spine. No canal or neural foraminal stenosis. Results were called by telephone at the time of interpretation on 01/30/2014 at 1:26 AM to Dr. Alexis Goodell, who verbally acknowledged these results.   Electronically Signed   By: Jeannine Boga M.D.   On: 01/30/2014 01:19   Mr Cervical Spine Wo Contrast  01/30/2014   CLINICAL DATA:  New onset flaccid paralysis.  EXAM: MRI HEAD WITHOUT CONTRAST  MRA HEAD WITHOUT CONTRAST  MRI  CERVICAL SPINE WITHOUT CONTRAST  TECHNIQUE: Multiplanar, multiecho pulse sequences of the brain and surrounding structures were obtained without intravenous contrast. Angiographic images of the head were obtained using MRA technique without contrast.  COMPARISON:  None.  FINDINGS: MRI HEAD FINDINGS:  Study is degraded by motion artifact.  The CSF containing spaces are within normal limits for patient age. A few scattered T2/FLAIR hyperintense foci seen within the periventricular and deep white matter of both cerebral hemispheres, nonspecific, and of doubtful clinical significance. No mass lesion, midline shift, or extra-axial fluid collection. Ventricles are normal in size without evidence of hydrocephalus.  No diffusion-weighted signal abnormality is identified to suggest acute intracranial infarct. Gray-white matter differentiation is maintained. Normal flow voids are seen within the intracranial vasculature. No intracranial hemorrhage identified.  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 There compensatory mild beaking of the fourth ventricle.  Pituitary gland is within normal limits. Pituitary stalk is midline. The globes and optic nerves demonstrate a normal appearance with normal signal intensity. The  The bone marrow signal intensity is normal. Calvarium is intact. Visualized upper cervical spine is within normal limits.  Scalp soft tissues are unremarkable.  Paranasal sinuses are clear.  No mastoid effusion.  MRA HEAD FINDINGS:  Study is markedly limited due to motion artifact.  Anterior circulation: Normal flow related enhancement of the included cervical, petrous, cavernous and supra clinoid internal carotid arteries. Patent anterior communicating artery. Normal flow related enhancement of the anterior and middle cerebral arteries, including more distal segments.  Posterior circulation: Left vertebral artery is  dominant. Basilar artery is patent, with normal flow related enhancement of the main branch vessels. Normal flow related enhancement of the posterior cerebral arteries.  No large vessel occlusion, hemodynamically significant stenosis, abnormal luminal irregularity, aneurysm within the anterior nor posterior circulation.  MRI CERVICAL SPINE:  Study is limited by motion artifact.  There is gentle reversal of the normal cervical lordosis, likely related to patient positioning. Chiari 1 malformation noted, better evaluated on concomitant MRI of the brain.  T2 hyperintense intra medullary lesion within the cervical spinal cord extending from the level of C4 inferiorly through the T1-2 intervertebral disc space is compatible with an associated syrinx. This measures approximately in 1 cm in greatest transaxial diameter at the level of C6-7. No definite associated mass lesion. Otherwise, signal intensity within the cervical spinal cord is normal.  Signal intensity within the vertebral body bone marrow is within normal limits.  No significant degenerative disc bulge or disc protrusion identified within the cervical spine. No canal or neural foraminal stenosis.  Visualized paraspinous soft tissues are within normal limits.  IMPRESSION: MRI BRAIN:  1. No acute intracranial abnormality identified. 2. 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  MRA BRAIN:  1. Limited study due to motion. 2. No proximal branch occlusion or definite high-grade flow-limiting stenosis within the intracranial circulation.  MRI CERVICAL SPINE:  1. T2 hyperintense intra medullary cystic lesion extending from the C4 through T1-2 level as above. 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 Further imaging with post-contrast MRI of the cervical spine to evaluate for possible  enhancement may be helpful for further evaluation. 2. No significant degenerative disc disease within the cervical spine. No canal or neural foraminal stenosis. Results were called by telephone at the time of interpretation on 01/30/2014 at 1:26 AM to Dr. Alexis Goodell, who verbally acknowledged these results.   Electronically Signed   By: Jeannine Boga M.D.   On: 01/30/2014 01:19   Dg Chest Port 1 View  01/29/2014   CLINICAL DATA:  Lethargy, fatigue  EXAM: PORTABLE CHEST - 1 VIEW  COMPARISON:  10/02/2013  FINDINGS: Cardiac shadow is stable. The lungs are well aerated bilaterally. Mild patchy infiltrative density is noted in the right mid and upper lung. No acute bony abnormality is noted. Chronic changes of the distal left clavicle are again seen.  IMPRESSION: Patchy infiltrate in the right mid and upper lung. Followup films are recommended.   Electronically Signed   By: Inez Catalina M.D.   On: 01/29/2014 09:16   Dg Abd Portable 1v  01/29/2014   CLINICAL DATA:  Abdominal pain, hyperglycemia, history diabetes, gastroparesis  EXAM: PORTABLE ABDOMEN - 1 VIEW  COMPARISON:  Portable exam 2059 hr compared to 09/28/2013  FINDINGS: Slight motion artifact limits exam.  Dilated small bowel loop in the mid abdomen, nonspecific.  Remaining bowel loops normal.  No definite bowel wall thickening or urinary tract calcification.  Osseous structures unremarkable.  Multiple left pelvic phleboliths.  IMPRESSION: Single mildly distended small bowel loop in the left mid abdomen, nonspecific.  No other abnormalities identified.   Electronically Signed   By: Lavonia Dana M.D.   On: 01/29/2014 21:14   Dg Toe Great Left  01/29/2014   CLINICAL DATA:  Diabetic patient.  Skin ulceration.  EXAM: LEFT GREAT TOE  COMPARISON:  None.  FINDINGS: Soft tissues of the great toe appear swollen. No bony destructive change or soft tissue gas collection is identified. There is no fracture or dislocation.  IMPRESSION: Soft tissue swelling  without plain film evidence of osteomyelitis or other acute bony abnormality.   Electronically Signed   By: Inge Rise M.D.   On: 01/29/2014 13:40   Dg Toe Great Right  01/29/2014   CLINICAL DATA:  Hyperglycemia  EXAM: RIGHT GREAT TOE  COMPARISON:  MRI 4 foot 01/29/2013 ;  prior radiographs of the right foot 01/28/2013  FINDINGS: Tissue irregularity of the medial aspect of the great toe at the interphalangeal joint concerning for cellulitis or ulceration. Soft tissue swelling and bulging of the nail bed. No conventional radiographic evidence of osteomyelitis. No acute fracture or malalignment. Atherosclerotic calcifications noted in the visualized small vessels.  IMPRESSION: 1. Diffuse soft tissue swelling about the toe concerning for cellulitis. 2. No evidence of acute osseous abnormality.   Electronically Signed   By: Jacqulynn Cadet M.D.   On: 01/29/2014 13:41   Mr Jodene Nam Head/brain Wo Cm  01/30/2014   CLINICAL DATA:  New onset flaccid paralysis.  EXAM: MRI HEAD WITHOUT CONTRAST  MRA HEAD WITHOUT CONTRAST  MRI CERVICAL SPINE WITHOUT CONTRAST  TECHNIQUE: Multiplanar, multiecho pulse sequences of the brain and surrounding structures were obtained without intravenous contrast. Angiographic images of the head were obtained using MRA technique without contrast.  COMPARISON:  None.  FINDINGS: MRI HEAD FINDINGS:  Study is degraded by motion artifact.  The CSF containing spaces are within normal limits for patient age. A few scattered T2/FLAIR hyperintense foci seen within the periventricular and deep white matter of both cerebral hemispheres, nonspecific, and of doubtful clinical significance. No mass lesion, midline shift, or extra-axial fluid collection. Ventricles are normal in size without evidence of hydrocephalus.  No diffusion-weighted signal abnormality is identified to suggest acute intracranial infarct. Gray-white matter differentiation is maintained. Normal flow voids are seen within the  intracranial vasculature. No intracranial hemorrhage identified.  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 There compensatory mild beaking of the fourth ventricle.  Pituitary gland is within normal limits. Pituitary stalk is midline. The globes and optic nerves demonstrate a normal appearance with normal signal intensity. The  The bone marrow signal intensity is normal. Calvarium is intact. Visualized upper cervical spine is within normal limits.  Scalp soft tissues are unremarkable.  Paranasal sinuses are clear.  No mastoid effusion.  MRA HEAD FINDINGS:  Study is markedly limited due to motion artifact.  Anterior circulation: Normal flow related enhancement of the included cervical, petrous, cavernous and supra clinoid internal carotid arteries. Patent anterior communicating artery. Normal flow related enhancement of the anterior and middle cerebral arteries, including more distal segments.  Posterior circulation: Left vertebral artery is dominant. Basilar artery is patent, with normal flow related enhancement of the main branch vessels. Normal flow related enhancement of the posterior cerebral arteries.  No large vessel occlusion, hemodynamically significant stenosis, abnormal luminal irregularity, aneurysm within the anterior nor posterior circulation.  MRI CERVICAL SPINE:  Study is limited by motion artifact.  There is gentle reversal of the normal cervical lordosis, likely related to patient positioning. Chiari 1 malformation noted, better evaluated on concomitant MRI of the brain. T2 hyperintense intra medullary lesion within the cervical spinal cord extending from the level of C4 inferiorly through the T1-2 intervertebral disc space is compatible with an associated syrinx. This measures approximately in 1 cm in greatest transaxial diameter at the level of C6-7. No definite associated mass lesion. Otherwise, signal  intensity within the cervical spinal cord is normal.  Signal intensity within the vertebral body bone marrow is within normal limits.  No significant degenerative disc bulge or disc protrusion identified within the cervical spine. No canal or neural foraminal stenosis.  Visualized paraspinous soft tissues are within normal limits.  IMPRESSION: MRI BRAIN:  1. No acute intracranial abnormality identified. 2. 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  MRA BRAIN:  1. Limited study due to motion. 2. No proximal branch occlusion or definite high-grade flow-limiting stenosis within the intracranial circulation.  MRI CERVICAL SPINE:  1. T2 hyperintense intra medullary cystic lesion extending from the C4 through T1-2 level as above. 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 Further imaging with post-contrast MRI of the cervical spine to evaluate for possible enhancement may be helpful for further evaluation. 2. No significant degenerative disc disease within the cervical spine. No canal or neural foraminal stenosis. Results were called by telephone at the time of interpretation on 01/30/2014 at 1:26 AM to Dr. Alexis Goodell, who verbally acknowledged these results.   Electronically Signed   By: Jeannine Boga M.D.   On: 01/30/2014 01:19     CXR:  Mild patchy right sided airspace disease   ASSESSMENT / PLAN:  PULMONARY A: possible aspiration PNA  P:   NPO except meds Wean Fio2 F/u cxr   CARDIOVASCULAR A: sepsis  P:  Keep even volume status  Tele   RENAL A:   AKI, improved w/ IVFs P:   Keep even to + volume status Renal dose meds   GASTROINTESTINAL A:   Diabetic gastroparesis  GERD P:   PPI Aspiration precautions   HEMATOLOGIC A:   Chronic anemia (microcytic/hypochromic) P:  Ck anemia panel   hemeoccult stools Hold heparin no good place to put SCDs will try LEs.    INFECTIOUS A:   SIRS/sepsis Concerned about CSF infection vs transverse myelitis  trush Infected diabetic foot ulcers Possible aspiration   Appreciate  ID comments:  "Multiple infectious agents can cause molecular mimicry leading to transverse myelitis (staph, strep, VZV) as well multiple agents can cause TM (lyme (not endemic to [place]), syphillis, HIV, [place] (not clear that mosquitos are present). There are multiple causes of inflammatory conditions that can cause this as well (SLE, sarcoid).  Lastly, would like to know that this is not a progressive structural disease".  P:   abx widened Acyclovir added HIV RNA, Lyme, RPR, BCx, Ucx, ANA/ANCA/RF/ESR/ CRP.  ? Re-imaging vs LP scan.. Defer timing of LP. Would like to know that this is NOT a CSF infection prior to staring high dose steroids.   ENDOCRINE A:  DM P:   cbg q 4 w/sensitive scale  NEUROLOGIC A:   Progressive weakness R/o transverse myelitis  P:   Neuro following Cont IV abx Neuro-surg does not feel surgical issue Consider re-imaging this afternoon   TODAY'S SUMMARY: For now will widen abx. The primary issue here is are we dealing with an acute infective process OR inflammatory process. He needs an LP but just got [place] heparin. ID and neurology are following and will defer that discussion to them at this point.  His end-organ failure has improved.  Will see how he does.  Might need high dose steroids.  Given deterioration of symptoms, I communicated with neuro, they feel pretty strongly that this is transverse myelitis and not meningitis.  Unfortunately the patient has received SQ heparin at 11 AM today DVT prophylaxis dosage.  Spoke with neuro, they do not feel comfortable doing LP with SQ heparin.  Spoke with IR who will only do it if neurosurgery can provide them with back up in case of an epidural hematoma.  Paged Neurosurgery who is not willing  to provide backup and recommended that we just do the LP at 7 PM tonight after heparin has been metabolized.  In the meantime will transfer to the ICU for closer observation.  Given the delay from LP and the fact that patient is already on abx for meningitis and he is visibly deteriorating infornt of [name] on exam will go ahead and start steroids.  I have personally obtained a history, examined the patient, evaluated laboratory and imaging results, formulated the assessment and plan and placed orders.  CRITICAL CARE: The patient is critically ill with multiple organ systems failure and requires high complexity decision making for assessment and support, frequent evaluation and titration of therapies, application of advanced monitoring technologies and extensive interpretation of multiple databases. Critical Care Time devoted to patient care services described in this note is 90 minutes.   Rush Farmer, M.D. [facility] Pulmonary/Critical Care Medicine. Pager: [phone]. After hours pager: [phone].

## 2014-01-30 NOTE — Progress Notes (Signed)
eLink Physician-Brief Progress Note Patient Name: Jason [name] DOB: 1986-03-15 MRN: [id number]  Date of Service  01/30/2014   HPI/Events of Note   Patient c/o pain from muscle spasms.  eICU Interventions  Fentanyl dose increased to 50 mcg and robaxin added.  Nurse not to give fentanyl and robaxin simultaneously to avoid oversedation.  Concern for progressive weakness and resp failure.      Melvia Heaps 01/30/2014, 9:24 PM

## 2014-01-30 NOTE — Progress Notes (Signed)
[facility] PROGRESS NOTE  Jason [name] [id number] DOB: 1986-02-27 DOA: 01/29/2014 PCP: [name], MD   Brief narrative 28 year old male with history of uncontrolled diabetes with diabetes neuropathy, gastroparesis, diabetic foot ulcers presented with the acute onset of bilateral lower extremity flaccid paralysis he did patient reported loss of  both motor and sensory function over his b/l legs. No bowel or urinary symptoms noted.  He had marked hyperglycemia in 500s without anion gap on presentation. He was started on insulin drip but became hypoglycemic to 50s so it was stopped and placed on D5. Denies any new medications or recent illness. Patient found to be in sepsis with significant leukocytosis, bilateral lower extremity weakness with sensory deficit, chest x-ray showing right-sided pneumonia and findings a bilateral lower extremity cellulitis with superficial ulceration. Yes on was 120. X-ray of the foot done without findings of osteomyelitis.  An MRI of the brain and cervical spine done showed medullary cystic lesion extending from C4-T2 suggestive that it may reflect a cyrinx associated with chiari  1 malformation and given the acute onset of symptoms cannot rule out transverse myelitis. Patient seen by neurology and recommended continuing empiric antibiotics. Recommended holding steroids   given underlying infection and hypoglycemia. he now has progressive weakness of his upper extremities as well.  Assessment/Plan: Bilateral lower leg weakness with progression to upper extremities Patient initial presentation was bilaterally 70 weakness and numbness. He also has been drowsy and poorly arousable. MRA of the cervical spine shows findings as mentioned above. Appreciate neurology's recommendations. Discussed the MRI findings with neurosurgery Dr Hal Neer who recommended this is likely inflammatory and intramedullary so there is no role for surgical intervention. continue  empiric abx i have spoken with PCCM consult and given progression of symptoms and lethargy, patient will be moved to ICU for closer monitoring. ABG repeated and was normal. Patient  is hemodynamically stable at this time. Neurology closely following. Concern for transverse myelitis. Patient will need an LP for further confirmatory study. Patient n.p.o.  Sepsis Patient has a right-sided pneumonia and bilateral leg cellulitis and foul smelling  foot ulcers. Mother reports that he was to wound care center for his foot ulcers . Xray negative for osteomyelitis but will need MRI of the foot to r/o osteomyelitis once more stable. Monitor WBC and temperature curve. Continue empiric vancomycin and aztreonam. Follow blood cultures. I have consulted ID (Dr Johnnye Sima) who will evaluate the patient.  encephalopathy CT head and MRI brain unremarkable for acute findings. Possibly in the setting of sepsis and ativan he received earlier. Will monitor with neuro checks closely.  uncontrolled DM Has peripheral neuropathy and gastroparesis  presented with hyperglycemia but became hypoglycemic while on insulin drip which was discontinued. Monitor fsg and SSI.  Acute on chronic kidney disease Improving a.m. labs. Monitor closely  DVT prophylaxis SQheparin    Code Status: full code Family Communication: mother at bedside Disposition Plan: transfer to ICU   Consultants:  Neurology   neurosx  PCCM  Procedures:  none  Antibiotics:  I will vancomycin and aztreonam  HPI/Subjective: Patient seen and examined this morning has progressive weakness involving his upper extremities as well. He remains sleepy but arousable.  Objective: Filed Vitals:   01/30/14 0804  BP:   Pulse: 102  Temp:   Resp: 31    Intake/Output Summary (Last 24 hours) at 01/30/14 1039 Last data filed at 01/30/14 0806  Gross per 24 hour  Intake   1475 ml  Output   1315 ml  Net    160 ml   Filed Weights   01/29/14  1800 01/30/14 0604  Weight: 63.5 kg (139 lb 15.9 oz) 64.184 kg (141 lb 8 oz)    Exam:   General:  On Male lying in bed appears lethargic but arousable to commands  HEENT: no Pallor, moist oral mucosa, no icterus,  Chest: clear b/l, no added sounds  CVS: NS1 &S2, no MRG  Abd: soft, NT, ND, BS+  Foley in place  Ext: warm, no edema, superficial ulceration voer tibia b/l with b/l plantar ulcers with foul smelling discharge.  CNS; sleepy  but arousable to commands. Analysis over bilateral lower extremities with absent sensations. Power 3+/5 in b/l upper extremities with poor sensations.  Data Reviewed: Basic Metabolic Panel:  Recent Labs Lab 01/29/14 0858 01/29/14 2113 01/30/14 0428  NA 132* 138 138  K 4.4 3.9 4.2  CL 95* 103 104  CO2 22 22 21   GLUCOSE 532* 136* 227*  BUN 33* 22 20  CREATININE 1.60* 0.98 0.94  CALCIUM 9.0 8.4 8.5  MG  --  1.7  --   PHOS  --  3.1  --    Liver Function Tests:  Recent Labs Lab 01/29/14 0858 01/29/14 2113 01/30/14 0428  AST 10 12 9   ALT 9 8 7   ALKPHOS 179* 190* 171*  BILITOT 0.2* 0.3 0.3  PROT 7.6 6.8 6.7  ALBUMIN 1.8* 1.5* 1.5*    Recent Labs Lab 01/29/14 0858  LIPASE 10*   No results found for this basename: AMMONIA,  in the last 168 hours CBC:  Recent Labs Lab 01/29/14 0858 01/30/14 0428  WBC 32.0* 33.6*  NEUTROABS 28.5* 28.2*  HGB 9.4* 8.4*  HCT 28.1* 25.6*  MCV 77.6* 76.9*  PLT 462* 434*   Cardiac Enzymes:  Recent Labs Lab 01/29/14 2113  CKTOTAL 41   BNP (last 3 results)  Recent Labs  09/28/13 0430 10/02/13 0445 01/29/14 2113  PROBNP 673.0* 2451.0* 1343.0*   CBG:  Recent Labs Lab 01/29/14 2014 01/29/14 2344 01/30/14 0206 01/30/14 0412 01/30/14 0608  GLUCAP 147* 175* 207* 223* 253*    No results found for this or any previous visit (from the past 240 hour(s)).   Studies: Ct Head Wo Contrast  01/29/2014   CLINICAL DATA:  Hyperglycemia.  Diabetes.  EXAM: CT HEAD WITHOUT CONTRAST   TECHNIQUE: Contiguous axial images were obtained from the base of the skull through the vertex without intravenous contrast.  COMPARISON:  None.  FINDINGS: Image quality degraded by mild motion.  Ventricle size is normal. Small hypodensity left frontal white matter, likely due to chronic ischemia. Negative for acute infarct, hemorrhage, or mass lesion. Calvarium intact. Visualized sinuses are clear.  IMPRESSION: Small hypodensity left frontal white matter, most consistent with mild chronic microvascular ischemia. No acute abnormality.   Electronically Signed   By: Franchot Gallo M.D.   On: 01/29/2014 11:16   Ct Lumbar Spine Wo Contrast  01/29/2014   CLINICAL DATA:  Chronic neuropathy. Diabetes. Bilateral leg numbness. Hyperglycemia.  EXAM: CT LUMBAR SPINE WITHOUT CONTRAST  TECHNIQUE: Multidetector CT imaging of the lumbar spine was performed without intravenous contrast administration. Multiplanar CT image reconstructions were also generated.  COMPARISON:  None.  FINDINGS: Negative for fracture or mass. Disc spaces are normal. No degenerative change. Facet joints are normal.  No soft tissue swelling. The psoas muscle and retroperitoneum are normal.  No evidence of spinal infection. MRI with contrast is more sensitive than CT for detection of spinal infection.  IMPRESSION: Normal   Electronically Signed   By: Franchot Gallo M.D.   On: 01/29/2014 11:28   Mr Brain Wo Contrast  01/30/2014   CLINICAL DATA:  New onset flaccid paralysis.  EXAM: MRI HEAD WITHOUT CONTRAST  MRA HEAD WITHOUT CONTRAST  MRI CERVICAL SPINE WITHOUT CONTRAST  TECHNIQUE: Multiplanar, multiecho pulse sequences of the brain and surrounding structures were obtained without intravenous contrast. Angiographic images of the head were obtained using MRA technique without contrast.  COMPARISON:  None.  FINDINGS: MRI HEAD FINDINGS:  Study is degraded by motion artifact.  The CSF containing spaces are within normal limits for patient age. A few  scattered T2/FLAIR hyperintense foci seen within the periventricular and deep white matter of both cerebral hemispheres, nonspecific, and of doubtful clinical significance. No mass lesion, midline shift, or extra-axial fluid collection. Ventricles are normal in size without evidence of hydrocephalus.  No diffusion-weighted signal abnormality is identified to suggest acute intracranial infarct. Gray-white matter differentiation is maintained. Normal flow voids are seen within the intracranial vasculature. No intracranial hemorrhage identified.  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 There compensatory mild beaking of the fourth ventricle.  Pituitary gland is within normal limits. Pituitary stalk is midline. The globes and optic nerves demonstrate a normal appearance with normal signal intensity. The  The bone marrow signal intensity is normal. Calvarium is intact. Visualized upper cervical spine is within normal limits.  Scalp soft tissues are unremarkable.  Paranasal sinuses are clear.  No mastoid effusion.  MRA HEAD FINDINGS:  Study is markedly limited due to motion artifact.  Anterior circulation: Normal flow related enhancement of the included cervical, petrous, cavernous and supra clinoid internal carotid arteries. Patent anterior communicating artery. Normal flow related enhancement of the anterior and middle cerebral arteries, including more distal segments.  Posterior circulation: Left vertebral artery is dominant. Basilar artery is patent, with normal flow related enhancement of the main branch vessels. Normal flow related enhancement of the posterior cerebral arteries.  No large vessel occlusion, hemodynamically significant stenosis, abnormal luminal irregularity, aneurysm within the anterior nor posterior circulation.  MRI CERVICAL SPINE:  Study is limited by motion artifact.  There is gentle reversal of the normal  cervical lordosis, likely related to patient positioning. Chiari 1 malformation noted, better evaluated on concomitant MRI of the brain. T2 hyperintense intra medullary lesion within the cervical spinal cord extending from the level of C4 inferiorly through the T1-2 intervertebral disc space is compatible with an associated syrinx. This measures approximately in 1 cm in greatest transaxial diameter at the level of C6-7. No definite associated mass lesion. Otherwise, signal intensity within the cervical spinal cord is normal.  Signal intensity within the vertebral body bone marrow is within normal limits.  No significant degenerative disc bulge or disc protrusion identified within the cervical spine. No canal or neural foraminal stenosis.  Visualized paraspinous soft tissues are within normal limits.  IMPRESSION: MRI BRAIN:  1. No acute intracranial abnormality identified. 2. 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  MRA BRAIN:  1. Limited study due to motion. 2. No proximal branch occlusion or definite high-grade flow-limiting stenosis within the intracranial circulation.  MRI CERVICAL SPINE:  1. T2 hyperintense intra medullary cystic lesion extending from the C4 through T1-2 level as above. 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 Given the history of new onset flaccid paralysis, possible transverse  myelitis could also be considered. Further imaging with post-contrast MRI of the cervical spine to evaluate for possible enhancement may be helpful for further evaluation. 2. No significant degenerative disc disease within the cervical spine. No canal or neural foraminal stenosis. Results were called by telephone at the time of interpretation on 01/30/2014 at 1:26 AM to Dr. Alexis Goodell, who verbally acknowledged these results.   Electronically Signed   By: Jeannine Boga M.D.   On: 01/30/2014 01:19   Mr Cervical Spine Wo  Contrast  01/30/2014   CLINICAL DATA:  New onset flaccid paralysis.  EXAM: MRI HEAD WITHOUT CONTRAST  MRA HEAD WITHOUT CONTRAST  MRI CERVICAL SPINE WITHOUT CONTRAST  TECHNIQUE: Multiplanar, multiecho pulse sequences of the brain and surrounding structures were obtained without intravenous contrast. Angiographic images of the head were obtained using MRA technique without contrast.  COMPARISON:  None.  FINDINGS: MRI HEAD FINDINGS:  Study is degraded by motion artifact.  The CSF containing spaces are within normal limits for patient age. A few scattered T2/FLAIR hyperintense foci seen within the periventricular and deep white matter of both cerebral hemispheres, nonspecific, and of doubtful clinical significance. No mass lesion, midline shift, or extra-axial fluid collection. Ventricles are normal in size without evidence of hydrocephalus.  No diffusion-weighted signal abnormality is identified to suggest acute intracranial infarct. Gray-white matter differentiation is maintained. Normal flow voids are seen within the intracranial vasculature. No intracranial hemorrhage identified.  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 There compensatory mild beaking of the fourth ventricle.  Pituitary gland is within normal limits. Pituitary stalk is midline. The globes and optic nerves demonstrate a normal appearance with normal signal intensity. The  The bone marrow signal intensity is normal. Calvarium is intact. Visualized upper cervical spine is within normal limits.  Scalp soft tissues are unremarkable.  Paranasal sinuses are clear.  No mastoid effusion.  MRA HEAD FINDINGS:  Study is markedly limited due to motion artifact.  Anterior circulation: Normal flow related enhancement of the included cervical, petrous, cavernous and supra clinoid internal carotid arteries. Patent anterior communicating artery. Normal flow related enhancement  of the anterior and middle cerebral arteries, including more distal segments.  Posterior circulation: Left vertebral artery is dominant. Basilar artery is patent, with normal flow related enhancement of the main branch vessels. Normal flow related enhancement of the posterior cerebral arteries.  No large vessel occlusion, hemodynamically significant stenosis, abnormal luminal irregularity, aneurysm within the anterior nor posterior circulation.  MRI CERVICAL SPINE:  Study is limited by motion artifact.  There is gentle reversal of the normal cervical lordosis, likely related to patient positioning. Chiari 1 malformation noted, better evaluated on concomitant MRI of the brain. T2 hyperintense intra medullary lesion within the cervical spinal cord extending from the level of C4 inferiorly through the T1-2 intervertebral disc space is compatible with an associated syrinx. This measures approximately in 1 cm in greatest transaxial diameter at the level of C6-7. No definite associated mass lesion. Otherwise, signal intensity within the cervical spinal cord is normal.  Signal intensity within the vertebral body bone marrow is within normal limits.  No significant degenerative disc bulge or disc protrusion identified within the cervical spine. No canal or neural foraminal stenosis.  Visualized paraspinous soft tissues are within normal limits.  IMPRESSION: MRI BRAIN:  1. No acute intracranial abnormality identified. 2. 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  MRA BRAIN:  1. Limited study due to motion. 2. No proximal branch occlusion or definite high-grade flow-limiting stenosis within the intracranial circulation.  MRI CERVICAL SPINE:  1. T2 hyperintense intra medullary cystic lesion extending from the C4 through T1-2 level as above. 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 Given the history of new onset flaccid paralysis, possible  transverse myelitis could also be considered. Further imaging with post-contrast MRI of the cervical spine to evaluate for possible enhancement may be helpful for further evaluation. 2. No significant degenerative disc disease within the cervical spine. No canal or neural foraminal stenosis. Results were called by telephone at the time of interpretation on 01/30/2014 at 1:26 AM to Dr. Alexis Goodell, who verbally acknowledged these results.   Electronically Signed   By: Jeannine Boga M.D.   On: 01/30/2014 01:19   Dg Chest Port 1 View  01/29/2014   CLINICAL DATA:  Lethargy, fatigue  EXAM: PORTABLE CHEST - 1 VIEW  COMPARISON:  10/02/2013  FINDINGS: Cardiac shadow is stable. The lungs are well aerated bilaterally. Mild patchy infiltrative density is noted in the right mid and upper lung. No acute bony abnormality is noted. Chronic changes of the distal left clavicle are again seen.  IMPRESSION: Patchy infiltrate in the right mid and upper lung. Followup films are recommended.   Electronically Signed   By: Inez Catalina M.D.   On: 01/29/2014 09:16   Dg Abd Portable 1v  01/29/2014   CLINICAL DATA:  Abdominal pain, hyperglycemia, history diabetes, gastroparesis  EXAM: PORTABLE ABDOMEN - 1 VIEW  COMPARISON:  Portable exam 2059 hr compared to 09/28/2013  FINDINGS: Slight motion artifact limits exam.  Dilated small bowel loop in the mid abdomen, nonspecific.  Remaining bowel loops normal.  No definite bowel wall thickening or urinary tract calcification.  Osseous structures unremarkable.  Multiple left pelvic phleboliths.  IMPRESSION: Single mildly distended small bowel loop in the left mid abdomen, nonspecific.  No other abnormalities identified.   Electronically Signed   By: Lavonia Dana M.D.   On: 01/29/2014 21:14   Dg Toe Great Left  01/29/2014   CLINICAL DATA:  Diabetic patient.  Skin ulceration.  EXAM: LEFT GREAT TOE  COMPARISON:  None.  FINDINGS: Soft tissues of the great toe appear swollen. No bony  destructive change or soft tissue gas collection is identified. There is no fracture or dislocation.  IMPRESSION: Soft tissue swelling without plain film evidence of osteomyelitis or other acute bony abnormality.   Electronically Signed   By: Inge Rise M.D.   On: 01/29/2014 13:40   Dg Toe Great Right  01/29/2014   CLINICAL DATA:  Hyperglycemia  EXAM: RIGHT GREAT TOE  COMPARISON:  MRI 4 foot 01/29/2013 ; prior radiographs of the right foot 01/28/2013  FINDINGS: Tissue irregularity of the medial aspect of the great toe at the interphalangeal joint concerning for cellulitis or ulceration. Soft tissue swelling and bulging of the nail bed. No conventional radiographic evidence of osteomyelitis. No acute fracture or malalignment. Atherosclerotic calcifications noted in the visualized small vessels.  IMPRESSION: 1. Diffuse soft tissue swelling about the toe concerning for cellulitis. 2. No evidence of acute osseous abnormality.   Electronically Signed   By: Jacqulynn Cadet M.D.   On: 01/29/2014 13:41   Mr Jodene Nam Head/brain Wo Cm  01/30/2014   CLINICAL DATA:  New onset flaccid paralysis.  EXAM: MRI HEAD WITHOUT CONTRAST  MRA HEAD WITHOUT CONTRAST  MRI CERVICAL SPINE WITHOUT CONTRAST  TECHNIQUE:  Multiplanar, multiecho pulse sequences of the brain and surrounding structures were obtained without intravenous contrast. Angiographic images of the head were obtained using MRA technique without contrast.  COMPARISON:  None.  FINDINGS: MRI HEAD FINDINGS:  Study is degraded by motion artifact.  The CSF containing spaces are within normal limits for patient age. A few scattered T2/FLAIR hyperintense foci seen within the periventricular and deep white matter of both cerebral hemispheres, nonspecific, and of doubtful clinical significance. No mass lesion, midline shift, or extra-axial fluid collection. Ventricles are normal in size without evidence of hydrocephalus.  No diffusion-weighted signal abnormality is identified to  suggest acute intracranial infarct. Gray-white matter differentiation is maintained. Normal flow voids are seen within the intracranial vasculature. No intracranial hemorrhage identified.  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 There compensatory mild beaking of the fourth ventricle.  Pituitary gland is within normal limits. Pituitary stalk is midline. The globes and optic nerves demonstrate a normal appearance with normal signal intensity. The  The bone marrow signal intensity is normal. Calvarium is intact. Visualized upper cervical spine is within normal limits.  Scalp soft tissues are unremarkable.  Paranasal sinuses are clear.  No mastoid effusion.  MRA HEAD FINDINGS:  Study is markedly limited due to motion artifact.  Anterior circulation: Normal flow related enhancement of the included cervical, petrous, cavernous and supra clinoid internal carotid arteries. Patent anterior communicating artery. Normal flow related enhancement of the anterior and middle cerebral arteries, including more distal segments.  Posterior circulation: Left vertebral artery is dominant. Basilar artery is patent, with normal flow related enhancement of the main branch vessels. Normal flow related enhancement of the posterior cerebral arteries.  No large vessel occlusion, hemodynamically significant stenosis, abnormal luminal irregularity, aneurysm within the anterior nor posterior circulation.  MRI CERVICAL SPINE:  Study is limited by motion artifact.  There is gentle reversal of the normal cervical lordosis, likely related to patient positioning. Chiari 1 malformation noted, better evaluated on concomitant MRI of the brain. T2 hyperintense intra medullary lesion within the cervical spinal cord extending from the level of C4 inferiorly through the T1-2 intervertebral disc space is compatible with an associated syrinx. This measures approximately  in 1 cm in greatest transaxial diameter at the level of C6-7. No definite associated mass lesion. Otherwise, signal intensity within the cervical spinal cord is normal.  Signal intensity within the vertebral body bone marrow is within normal limits.  No significant degenerative disc bulge or disc protrusion identified within the cervical spine. No canal or neural foraminal stenosis.  Visualized paraspinous soft tissues are within normal limits.  IMPRESSION: MRI BRAIN:  1. No acute intracranial abnormality identified. 2. 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  MRA BRAIN:  1. Limited study due to motion. 2. No proximal branch occlusion or definite high-grade flow-limiting stenosis within the intracranial circulation.  MRI CERVICAL SPINE:  1. T2 hyperintense intra medullary cystic lesion extending from the C4 through T1-2 level as above. 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 Further imaging with post-contrast MRI of the cervical spine to evaluate for possible enhancement may be helpful for further evaluation. 2. No significant degenerative disc disease within the cervical spine. No canal or neural foraminal stenosis. Results were called by telephone at the time of interpretation on 01/30/2014 at  1:26 AM to Dr. Alexis Goodell, who verbally acknowledged these results.   Electronically Signed   By: Jeannine Boga M.D.   On: 01/30/2014 01:19    Scheduled Meds: . aspirin  300 mg Rectal Daily   Or  . aspirin  325 mg Oral Daily  . aztreonam  1 g Intravenous 3 times per day  . heparin  5,000 Units Subcutaneous 3 times per day  . insulin aspart  0-5 Units Subcutaneous QHS  . insulin aspart  0-9 Units Subcutaneous Q6H  . lidocaine  1 patch Transdermal Q24H  . sodium chloride  3 mL Intravenous Q12H  . vancomycin  500 mg  Intravenous Q12H   Continuous Infusions: . sodium chloride 1,000 mL (01/29/14 2030)     Time spent: 35 minutes    Moreen Piggott  [facility] Pager [phone]. If 7PM-7AM, please contact night-coverage at [email or web address], password [facility] 01/30/2014, 10:39 AM  LOS: 1 day

## 2014-01-30 NOTE — Consult Note (Addendum)
[facility]  Date of Admission:  01/29/2014  Date of Consult:  01/30/2014  Reason for Consult: Transverse Myelitis Referring Physician: Dhungel  Impression/Recommendation Transverse Myelitis thrush Diabetic foot infection  Add levaquin Add fluconazole Add acyclovir Check HIV RNA, Lyme, RPR, BCx, Ucx, ANA/ANCA/RF/ESR/ CRP.  Neurosurgery eval? Consider LP, repeat imaging, starting steroids  Comment- Multiple infectious agents can cause molecular mimicry leading to transverse myelitis (staph, strep, VZV) as well multiple agents can cause TM (lyme (not endemic to [place]), syphillis, HIV, [place] (not clear that mosquitos are present)).  There are multiple causes of inflammatory conditions that can cause this as well (SLE, sarcoid).  Lastly, would like to know that this is not a progressive structural disease.  Thank you so much for this fascinating consult,   Campbell Riches (pager) [phone] [email or web address]  Jason [name] is an 28 y.o. male.  HPI: 28 yo M with hx of DM since 28 yo, with neuropathy/GERD/LE ulcers comes to ED on 4-24 after 24 h of worsening weakness and numbness in his L leg.  He was fatigued yesterday afternoon, and took a nap. Upon awakening, around 8pm his noted inability to move his leg. His wife checked his sugar overnight (noted high) and he was given additional insulin. The morning of 4-24 he had worsening of his weakness as well as abdominal distention.  His WBC was 32k on admission, he was afebrile. His Glc was 532, CO2 22, Alb 1.8, Cr 1.6.  He had MRI of his R great toe showed cellulitis. CT head showed mild chronic microvascular ischemia.   He underwent MRI of CNS: CERVICAL SPINE: 1. T2 hyperintense intra medullary cystic lesion extending from the C4 through T1-2 level as above. 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 Given the history of new onset flaccid paralysis, possible transverse  myelitis could also be considered.  Due to multiple drug allergies he was started on vanco/aztreonam.   Currently he complains of weakness, back pain. Denies fevers at home.  No new medications.  No recent vaccinations  Past Medical History  Diagnosis Date  . Diabetes mellitus   . GERD (gastroesophageal reflux disease)   . Asthma   . Hx MRSA infection     on face  . Gastroparesis   . Diabetic neuropathy   . Seizures     Past Surgical History  Procedure Laterality Date  . Tonsillectomy       Allergies  Allergen Reactions  . Cefuroxime Axetil Anaphylaxis  . Penicillins Anaphylaxis    ?can take amoxicillin?  [name] [Benzonatate] Anaphylaxis  . Shellfish Allergy Itching   Childhood allergic reactions. Pt does not remember.   Medications:  Scheduled: . aspirin  300 mg Rectal Daily   Or  . aspirin  325 mg Oral Daily  . aztreonam  1 g Intravenous 3 times per day  . heparin  5,000 Units Subcutaneous 3 times per day  . insulin aspart  0-5 Units Subcutaneous QHS  . insulin aspart  0-9 Units Subcutaneous Q6H  . lidocaine  1 patch Transdermal Q24H  . sodium chloride  3 mL Intravenous Q12H  . vancomycin  500 mg Intravenous Q12H    Abtx:  Anti-infectives   Start     Dose/Rate Route Frequency Ordered Stop   01/29/14 2200  vancomycin (VANCOCIN) 500 mg in sodium chloride 0.9 % 100 mL IVPB  Status:  Discontinued     500 mg 100 mL/hr over 60 Minutes  Intravenous Every 12 hours 01/29/14 0952 01/29/14 2022   01/29/14 2200  vancomycin (VANCOCIN) 500 mg in sodium chloride 0.9 % 100 mL IVPB     500 mg 100 mL/hr over 60 Minutes Intravenous Every 12 hours 01/29/14 2034     01/29/14 2100  aztreonam (AZACTAM) 1 g in dextrose 5 % 50 mL IVPB     1 g 100 mL/hr over 30 Minutes Intravenous 3 times per day 01/29/14 2031     01/29/14 1000  vancomycin (VANCOCIN) IVPB 1000 mg/200 mL premix     1,000 mg 200 mL/hr over 60 Minutes Intravenous STAT 01/29/14 0952 01/29/14 1237   01/29/14  0945  aztreonam (AZACTAM) 2 g in dextrose 5 % 50 mL IVPB  Status:  Discontinued     2 g 100 mL/hr over 30 Minutes Intravenous  Once 01/29/14 0939 01/29/14 2022      Total days of antibiotics 1 (vanco/aztreonam)          Social History:  reports that he has been smoking Cigars.  He has never used smokeless tobacco. He reports that he drinks alcohol. He reports that he does not use illicit drugs.  Family History  Problem Relation Age of Onset  . Diabetes Father   . Hypertension Father   . Asthma      fhx  . Hypertension      fhx  . Stroke      fhx  . Heart disease Mother     General ROS: no fevers, has felt cold, constipation, urinary retention, + thrush, states he has had headaches, neck stiffness, photophobia. No new medications. see HPI.   Blood pressure 124/73, pulse 98, temperature 99.2 F (37.3 C), temperature source Axillary, resp. rate 19, height 5' 7.5" (1.715 m), weight 64.184 kg (141 lb 8 oz), SpO2 100.00%. General appearance: alert, cooperative and no distress Eyes: negative findings: pupils equal, round, reactive to light and accomodation Throat: abnormal findings: thrush Neck: no adenopathy, supple, symmetrical, trachea midline and FROM, no tenderness or meningismus Lungs: clear to auscultation bilaterally Heart: regular rate and rhythm Abdomen: normal findings: bowel sounds normal and soft, non-tender Extremities: superficial ulcers L shin. R shin wrapped. L great toe has ~ 1 cm ulcer, deep. R great toe has 2x1 cm ulcer, deep.  Neurologic: Sensory: absent light touch LE. UE have normal light touch per pt/on exam.  Motor: he has no LE strength. He is able to lift his R arm and grip, his L arm he lifts using proximal muscles.    Results for orders placed during the hospital encounter of 01/29/14 (from the past 48 hour(s))  CBG MONITORING, ED     Status: Abnormal   Collection Time    01/29/14  8:43 AM      Result Value Ref Range   Glucose-Capillary 441 (*) 70 -  99 mg/dL  CBC WITH DIFFERENTIAL     Status: Abnormal   Collection Time    01/29/14  8:58 AM      Result Value Ref Range   WBC 32.0 (*) 4.0 - 10.5 K/uL   Comment: WHITE COUNT CONFIRMED ON SMEAR   RBC 3.62 (*) 4.22 - 5.81 MIL/uL   Hemoglobin 9.4 (*) 13.0 - 17.0 g/dL   HCT 28.1 (*) 39.0 - 52.0 %   MCV 77.6 (*) 78.0 - 100.0 fL   MCH 26.0  26.0 - 34.0 pg   MCHC 33.5  30.0 - 36.0 g/dL   RDW 14.5  11.5 - 15.5 %  Platelets 462 (*) 150 - 400 K/uL   Comment: SPECIMEN CHECKED FOR CLOTS     PLATELET COUNT CONFIRMED BY SMEAR   Neutrophils Relative % 49  43 - 77 %   Lymphocytes Relative 6 (*) 12 - 46 %   Monocytes Relative 5  3 - 12 %   Eosinophils Relative 0  0 - 5 %   Basophils Relative 0  0 - 1 %   Band Neutrophils 39 (*) 0 - 10 %   Metamyelocytes Relative 1     Myelocytes 0     Promyelocytes Absolute 0     Blasts 0     nRBC 0  0 /100 WBC   Neutro Abs 28.5 (*) 1.7 - 7.7 K/uL   Lymphs Abs 1.9  0.7 - 4.0 K/uL   Monocytes Absolute 1.6 (*) 0.1 - 1.0 K/uL   Eosinophils Absolute 0.0  0.0 - 0.7 K/uL   Basophils Absolute 0.0  0.0 - 0.1 K/uL   WBC Morphology INCREASED BANDS (>20% BANDS)     Comment: MILD LEFT SHIFT (1-5% METAS, OCC MYELO, OCC BANDS)     TOXIC GRANULATION     VACUOLATED NEUTROPHILS  COMPREHENSIVE METABOLIC PANEL     Status: Abnormal   Collection Time    01/29/14  8:58 AM      Result Value Ref Range   Sodium 132 (*) 137 - 147 mEq/L   Potassium 4.4  3.7 - 5.3 mEq/L   Chloride 95 (*) 96 - 112 mEq/L   CO2 22  19 - 32 mEq/L   Glucose, Bld 532 (*) 70 - 99 mg/dL   BUN 33 (*) 6 - 23 mg/dL   Creatinine, Ser 1.60 (*) 0.50 - 1.35 mg/dL   Calcium 9.0  8.4 - 10.5 mg/dL   Total Protein 7.6  6.0 - 8.3 g/dL   Albumin 1.8 (*) 3.5 - 5.2 g/dL   AST 10  0 - 37 U/L   ALT 9  0 - 53 U/L   Alkaline Phosphatase 179 (*) 39 - 117 U/L   Total Bilirubin 0.2 (*) 0.3 - 1.2 mg/dL   GFR calc non Af Amer 57 (*) >90 mL/min   GFR calc Af Amer 66 (*) >90 mL/min   Comment: (NOTE)     The eGFR has  been calculated using the CKD EPI equation.     This calculation has not been validated in all clinical situations.     eGFR's persistently <90 mL/min signify possible Chronic Kidney     Disease.  LIPASE, BLOOD     Status: Abnormal   Collection Time    01/29/14  8:58 AM      Result Value Ref Range   Lipase 10 (*) 11 - 59 U/L  CULTURE, BLOOD (ROUTINE X 2)     Status: None   Collection Time    01/29/14  9:15 AM      Result Value Ref Range   Specimen Description BLOOD LEFT ARM     Special Requests BOTTLES DRAWN AEROBIC AND ANAEROBIC 5CC EACH     Culture  Setup Time       Value: 01/29/2014 14:12     Performed at [facility]   Culture       Value:        BLOOD CULTURE RECEIVED NO GROWTH TO DATE CULTURE WILL BE HELD FOR 5 DAYS BEFORE ISSUING A FINAL NEGATIVE REPORT     Performed at [facility]   Report Status  PENDING    I-STAT VENOUS BLOOD GAS, ED     Status: Abnormal   Collection Time    01/29/14  9:17 AM      Result Value Ref Range   pH, Ven 7.365 (*) 7.250 - 7.300   pCO2, Ven 45.3  45.0 - 50.0 mmHg   pO2, Ven 28.0 (*) 30.0 - 45.0 mmHg   Bicarbonate 25.9 (*) 20.0 - 24.0 mEq/L   TCO2 27  0 - 100 mmol/L   O2 Saturation 50.0     Sample type VENOUS     Comment VALUES EXPECTED, NO REPEAT    I-STAT CG4 LACTIC ACID, ED     Status: None   Collection Time    01/29/14  9:18 AM      Result Value Ref Range   Lactic Acid, Venous 2.12  0.5 - 2.2 mmol/L  URINALYSIS, ROUTINE W REFLEX MICROSCOPIC     Status: Abnormal   Collection Time    01/29/14  9:28 AM      Result Value Ref Range   Color, Urine YELLOW  YELLOW   APPearance CLEAR  CLEAR   Specific Gravity, Urine 1.023  1.005 - 1.030   pH 5.5  5.0 - 8.0   Glucose, UA >1000 (*) NEGATIVE mg/dL   Hgb urine dipstick MODERATE (*) NEGATIVE   Bilirubin Urine NEGATIVE  NEGATIVE   Ketones, ur NEGATIVE  NEGATIVE mg/dL   Protein, ur 30 (*) NEGATIVE mg/dL   Urobilinogen, UA 0.2  0.0 - 1.0 mg/dL   Nitrite NEGATIVE  NEGATIVE    Leukocytes, UA NEGATIVE  NEGATIVE  URINE CULTURE     Status: None   Collection Time    01/29/14  9:28 AM      Result Value Ref Range   Specimen Description URINE, CATHETERIZED     Special Requests NONE     Culture  Setup Time       Value: 01/29/2014 14:22     Performed at [place]       Value: NO GROWTH     Performed at [facility]   Culture       Value: NO GROWTH     Performed at [facility]   Report Status 01/30/2014 FINAL    URINE MICROSCOPIC-ADD ON     Status: Abnormal   Collection Time    01/29/14  9:28 AM      Result Value Ref Range   Squamous Epithelial / LPF RARE  RARE   WBC, UA 0-2  <3 WBC/hpf   RBC / HPF 7-10  <3 RBC/hpf   Bacteria, UA MANY (*) RARE   Casts HYALINE CASTS (*) NEGATIVE   Comment: GRANULAR CAST  CULTURE, BLOOD (ROUTINE X 2)     Status: None   Collection Time    01/29/14  9:35 AM      Result Value Ref Range   Specimen Description BLOOD RIGHT ARM     Special Requests BOTTLES DRAWN AEROBIC AND ANAEROBIC 5CC BOTH     Culture  Setup Time       Value: 01/29/2014 14:12     Performed at [facility]   Culture       Value:        BLOOD CULTURE RECEIVED NO GROWTH TO DATE CULTURE WILL BE HELD FOR 5 DAYS BEFORE ISSUING A FINAL NEGATIVE REPORT     Performed at [facility]   Report Status PENDING  CBG MONITORING, ED     Status: Abnormal   Collection Time    01/29/14 11:13 AM      Result Value Ref Range   Glucose-Capillary 360 (*) 70 - 99 mg/dL   Comment 1 Notify RN     Comment 2 Documented in Chart    CBG MONITORING, ED     Status: Abnormal   Collection Time    01/29/14 12:18 PM      Result Value Ref Range   Glucose-Capillary 291 (*) 70 - 99 mg/dL  CBG MONITORING, ED     Status: Abnormal   Collection Time    01/29/14  1:32 PM      Result Value Ref Range   Glucose-Capillary 203 (*) 70 - 99 mg/dL  CBG MONITORING, ED     Status: Abnormal   Collection Time    01/29/14  2:49 PM      Result  Value Ref Range   Glucose-Capillary 156 (*) 70 - 99 mg/dL   Comment 1 Notify RN     Comment 2 Documented in Chart    CBG MONITORING, ED     Status: Abnormal   Collection Time    01/29/14  4:02 PM      Result Value Ref Range   Glucose-Capillary 101 (*) 70 - 99 mg/dL  CBG MONITORING, ED     Status: Abnormal   Collection Time    01/29/14  5:09 PM      Result Value Ref Range   Glucose-Capillary 65 (*) 70 - 99 mg/dL  CBG MONITORING, ED     Status: Abnormal   Collection Time    01/29/14  5:30 PM      Result Value Ref Range   Glucose-Capillary 59 (*) 70 - 99 mg/dL  GLUCOSE, CAPILLARY     Status: Abnormal   Collection Time    01/29/14  6:32 PM      Result Value Ref Range   Glucose-Capillary 64 (*) 70 - 99 mg/dL   Comment 1 Notify RN     Comment 2 Documented in Chart    GLUCOSE, CAPILLARY     Status: Abnormal   Collection Time    01/29/14  7:13 PM      Result Value Ref Range   Glucose-Capillary 157 (*) 70 - 99 mg/dL  GLUCOSE, CAPILLARY     Status: Abnormal   Collection Time    01/29/14  8:14 PM      Result Value Ref Range   Glucose-Capillary 147 (*) 70 - 99 mg/dL  URINE RAPID DRUG SCREEN (HOSP PERFORMED)     Status: Abnormal   Collection Time    01/29/14  8:57 PM      Result Value Ref Range   Opiates POSITIVE (*) NONE DETECTED   Cocaine NONE DETECTED  NONE DETECTED   Benzodiazepines NONE DETECTED  NONE DETECTED   Amphetamines NONE DETECTED  NONE DETECTED   Tetrahydrocannabinol NONE DETECTED  NONE DETECTED   Barbiturates NONE DETECTED  NONE DETECTED   Comment:            DRUG SCREEN FOR MEDICAL PURPOSES     ONLY.  IF CONFIRMATION IS NEEDED     FOR ANY PURPOSE, NOTIFY LAB     WITHIN 5 DAYS.                LOWEST DETECTABLE LIMITS     FOR URINE DRUG SCREEN     Drug Class       Cutoff (  ng/mL)     Amphetamine      1000     Barbiturate      200     Benzodiazepine   469     Tricyclics       629     Opiates          300     Cocaine          300     THC              50    TSH     Status: None   Collection Time    01/29/14  9:13 PM      Result Value Ref Range   TSH 1.970  0.350 - 4.500 uIU/mL   Comment: Please note change in reference range.  MAGNESIUM     Status: None   Collection Time    01/29/14  9:13 PM      Result Value Ref Range   Magnesium 1.7  1.5 - 2.5 mg/dL  PHOSPHORUS     Status: None   Collection Time    01/29/14  9:13 PM      Result Value Ref Range   Phosphorus 3.1  2.3 - 4.6 mg/dL  COMPREHENSIVE METABOLIC PANEL     Status: Abnormal   Collection Time    01/29/14  9:13 PM      Result Value Ref Range   Sodium 138  137 - 147 mEq/L   Potassium 3.9  3.7 - 5.3 mEq/L   Chloride 103  96 - 112 mEq/L   CO2 22  19 - 32 mEq/L   Glucose, Bld 136 (*) 70 - 99 mg/dL   BUN 22  6 - 23 mg/dL   Creatinine, Ser 0.98  0.50 - 1.35 mg/dL   Calcium 8.4  8.4 - 10.5 mg/dL   Total Protein 6.8  6.0 - 8.3 g/dL   Albumin 1.5 (*) 3.5 - 5.2 g/dL   AST 12  0 - 37 U/L   ALT 8  0 - 53 U/L   Alkaline Phosphatase 190 (*) 39 - 117 U/L   Total Bilirubin 0.3  0.3 - 1.2 mg/dL   GFR calc non Af Amer >90  >90 mL/min   GFR calc Af Amer >90  >90 mL/min   Comment: (NOTE)     The eGFR has been calculated using the CKD EPI equation.     This calculation has not been validated in all clinical situations.     eGFR's persistently <90 mL/min signify possible Chronic Kidney     Disease.  PROTIME-INR     Status: None   Collection Time    01/29/14  9:13 PM      Result Value Ref Range   Prothrombin Time 14.2  11.6 - 15.2 seconds   INR 1.12  0.00 - 1.49  RETICULOCYTES     Status: Abnormal   Collection Time    01/29/14  9:13 PM      Result Value Ref Range   Retic Ct Pct 1.1  0.4 - 3.1 %   RBC. 3.17 (*) 4.22 - 5.81 MIL/uL   Retic Count, Manual 34.9  19.0 - 186.0 K/uL  PRO B NATRIURETIC PEPTIDE     Status: Abnormal   Collection Time    01/29/14  9:13 PM      Result Value Ref Range   Pro B Natriuretic peptide (BNP) 1343.0 (*) 0 - 125 pg/mL  CK  Status: None    Collection Time    01/29/14  9:13 PM      Result Value Ref Range   Total CK 41  7 - 232 U/L  SEDIMENTATION RATE     Status: Abnormal   Collection Time    01/29/14  9:13 PM      Result Value Ref Range   Sed Rate 120 (*) 0 - 16 mm/hr  GLUCOSE, CAPILLARY     Status: Abnormal   Collection Time    01/29/14 11:44 PM      Result Value Ref Range   Glucose-Capillary 175 (*) 70 - 99 mg/dL   Comment 1 Documented in Chart     Comment 2 Notify RN    BLOOD GAS, ARTERIAL     Status: Abnormal   Collection Time    01/29/14 11:50 PM      Result Value Ref Range   FIO2 0.21     Delivery systems ROOM AIR     pH, Arterial 7.417  7.350 - 7.450   pCO2 arterial 40.2  35.0 - 45.0 mmHg   pO2, Arterial 52.6 (*) 80.0 - 100.0 mmHg   Bicarbonate 25.4 (*) 20.0 - 24.0 mEq/L   TCO2 26.7  0 - 100 mmol/L   Acid-Base Excess 1.4  0.0 - 2.0 mmol/L   O2 Saturation 87.0     Patient temperature 98.6     Collection site RIGHT RADIAL     Drawn by [phone]     Sample type ARTERIAL DRAW     Allens test (pass/fail) PASS  PASS  GLUCOSE, CAPILLARY     Status: Abnormal   Collection Time    01/30/14  2:06 AM      Result Value Ref Range   Glucose-Capillary 207 (*) 70 - 99 mg/dL   Comment 1 Documented in Chart     Comment 2 Notify RN    GLUCOSE, CAPILLARY     Status: Abnormal   Collection Time    01/30/14  4:12 AM      Result Value Ref Range   Glucose-Capillary 223 (*) 70 - 99 mg/dL   Comment 1 Documented in Chart     Comment 2 Notify RN    COMPREHENSIVE METABOLIC PANEL     Status: Abnormal   Collection Time    01/30/14  4:28 AM      Result Value Ref Range   Sodium 138  137 - 147 mEq/L   Potassium 4.2  3.7 - 5.3 mEq/L   Chloride 104  96 - 112 mEq/L   CO2 21  19 - 32 mEq/L   Glucose, Bld 227 (*) 70 - 99 mg/dL   BUN 20  6 - 23 mg/dL   Creatinine, Ser 0.94  0.50 - 1.35 mg/dL   Calcium 8.5  8.4 - 10.5 mg/dL   Total Protein 6.7  6.0 - 8.3 g/dL   Albumin 1.5 (*) 3.5 - 5.2 g/dL   AST 9  0 - 37 U/L   ALT 7  0 - 53  U/L   Alkaline Phosphatase 171 (*) 39 - 117 U/L   Total Bilirubin 0.3  0.3 - 1.2 mg/dL   GFR calc non Af Amer >90  >90 mL/min   GFR calc Af Amer >90  >90 mL/min   Comment: (NOTE)     The eGFR has been calculated using the CKD EPI equation.     This calculation has not been validated in all clinical situations.  eGFR's persistently <90 mL/min signify possible Chronic Kidney     Disease.  CBC WITH DIFFERENTIAL     Status: Abnormal   Collection Time    01/30/14  4:28 AM      Result Value Ref Range   WBC 33.6 (*) 4.0 - 10.5 K/uL   RBC 3.33 (*) 4.22 - 5.81 MIL/uL   Hemoglobin 8.4 (*) 13.0 - 17.0 g/dL   HCT 25.6 (*) 39.0 - 52.0 %   MCV 76.9 (*) 78.0 - 100.0 fL   MCH 25.2 (*) 26.0 - 34.0 pg   MCHC 32.8  30.0 - 36.0 g/dL   RDW 14.5  11.5 - 15.5 %   Platelets 434 (*) 150 - 400 K/uL   Neutrophils Relative % 84 (*) 43 - 77 %   Lymphocytes Relative 10 (*) 12 - 46 %   Monocytes Relative 6  3 - 12 %   Eosinophils Relative 0  0 - 5 %   Basophils Relative 0  0 - 1 %   Neutro Abs 28.2 (*) 1.7 - 7.7 K/uL   Lymphs Abs 3.4  0.7 - 4.0 K/uL   Monocytes Absolute 2.0 (*) 0.1 - 1.0 K/uL   Eosinophils Absolute 0.0  0.0 - 0.7 K/uL   Basophils Absolute 0.0  0.0 - 0.1 K/uL   WBC Morphology INCREASED BANDS (>20% BANDS)    GLUCOSE, CAPILLARY     Status: Abnormal   Collection Time    01/30/14  6:08 AM      Result Value Ref Range   Glucose-Capillary 253 (*) 70 - 99 mg/dL   Comment 1 Documented in Chart     Comment 2 Notify RN    BLOOD GAS, ARTERIAL     Status: Abnormal   Collection Time    01/30/14  8:29 AM      Result Value Ref Range   O2 Content 3.0     Delivery systems NASAL CANNULA     pH, Arterial 7.426  7.350 - 7.450   pCO2 arterial 38.9  35.0 - 45.0 mmHg   pO2, Arterial 96.3  80.0 - 100.0 mmHg   Bicarbonate 25.2 (*) 20.0 - 24.0 mEq/L   TCO2 26.4  0 - 100 mmol/L   Acid-Base Excess 1.2  0.0 - 2.0 mmol/L   O2 Saturation 97.4     Patient temperature 98.6     Collection site LEFT RADIAL      Drawn by [id number]     Sample type ARTERIAL DRAW     Allens test (pass/fail) PASS  PASS      Component Value Date/Time   SDES BLOOD RIGHT ARM 01/29/2014 0935   SPECREQUEST BOTTLES DRAWN AEROBIC AND ANAEROBIC 5CC BOTH 01/29/2014 0935   CULT  Value:        BLOOD CULTURE RECEIVED NO GROWTH TO DATE CULTURE WILL BE HELD FOR 5 DAYS BEFORE ISSUING A FINAL NEGATIVE REPORT Performed at [facility] 01/29/2014 0935   REPTSTATUS PENDING 01/29/2014 0935   Ct Head Wo Contrast  01/29/2014   CLINICAL DATA:  Hyperglycemia.  Diabetes.  EXAM: CT HEAD WITHOUT CONTRAST  TECHNIQUE: Contiguous axial images were obtained from the base of the skull through the vertex without intravenous contrast.  COMPARISON:  None.  FINDINGS: Image quality degraded by mild motion.  Ventricle size is normal. Small hypodensity left frontal white matter, likely due to chronic ischemia. Negative for acute infarct, hemorrhage, or mass lesion. Calvarium intact. Visualized sinuses are clear.  IMPRESSION: Small hypodensity left frontal  white matter, most consistent with mild chronic microvascular ischemia. No acute abnormality.   Electronically Signed   By: Franchot Gallo M.D.   On: 01/29/2014 11:16   Ct Lumbar Spine Wo Contrast  01/29/2014   CLINICAL DATA:  Chronic neuropathy. Diabetes. Bilateral leg numbness. Hyperglycemia.  EXAM: CT LUMBAR SPINE WITHOUT CONTRAST  TECHNIQUE: Multidetector CT imaging of the lumbar spine was performed without intravenous contrast administration. Multiplanar CT image reconstructions were also generated.  COMPARISON:  None.  FINDINGS: Negative for fracture or mass. Disc spaces are normal. No degenerative change. Facet joints are normal.  No soft tissue swelling. The psoas muscle and retroperitoneum are normal.  No evidence of spinal infection. MRI with contrast is more sensitive than CT for detection of spinal infection.  IMPRESSION: Normal   Electronically Signed   By: Franchot Gallo M.D.   On: 01/29/2014 11:28    Mr Brain Wo Contrast  01/30/2014   CLINICAL DATA:  New onset flaccid paralysis.  EXAM: MRI HEAD WITHOUT CONTRAST  MRA HEAD WITHOUT CONTRAST  MRI CERVICAL SPINE WITHOUT CONTRAST  TECHNIQUE: Multiplanar, multiecho pulse sequences of the brain and surrounding structures were obtained without intravenous contrast. Angiographic images of the head were obtained using MRA technique without contrast.  COMPARISON:  None.  FINDINGS: MRI HEAD FINDINGS:  Study is degraded by motion artifact.  The CSF containing spaces are within normal limits for patient age. A few scattered T2/FLAIR hyperintense foci seen within the periventricular and deep white matter of both cerebral hemispheres, nonspecific, and of doubtful clinical significance. No mass lesion, midline shift, or extra-axial fluid collection. Ventricles are normal in size without evidence of hydrocephalus.  No diffusion-weighted signal abnormality is identified to suggest acute intracranial infarct. Gray-white matter differentiation is maintained. Normal flow voids are seen within the intracranial vasculature. No intracranial hemorrhage identified.  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 There compensatory mild beaking of the fourth ventricle.  Pituitary gland is within normal limits. Pituitary stalk is midline. The globes and optic nerves demonstrate a normal appearance with normal signal intensity. The  The bone marrow signal intensity is normal. Calvarium is intact. Visualized upper cervical spine is within normal limits.  Scalp soft tissues are unremarkable.  Paranasal sinuses are clear.  No mastoid effusion.  MRA HEAD FINDINGS:  Study is markedly limited due to motion artifact.  Anterior circulation: Normal flow related enhancement of the included cervical, petrous, cavernous and supra clinoid internal carotid arteries. Patent anterior communicating artery. Normal flow  related enhancement of the anterior and middle cerebral arteries, including more distal segments.  Posterior circulation: Left vertebral artery is dominant. Basilar artery is patent, with normal flow related enhancement of the main branch vessels. Normal flow related enhancement of the posterior cerebral arteries.  No large vessel occlusion, hemodynamically significant stenosis, abnormal luminal irregularity, aneurysm within the anterior nor posterior circulation.  MRI CERVICAL SPINE:  Study is limited by motion artifact.  There is gentle reversal of the normal cervical lordosis, likely related to patient positioning. Chiari 1 malformation noted, better evaluated on concomitant MRI of the brain. T2 hyperintense intra medullary lesion within the cervical spinal cord extending from the level of C4 inferiorly through the T1-2 intervertebral disc space is compatible with an associated syrinx. This measures approximately in 1 cm in greatest transaxial diameter at the level of C6-7. No definite associated mass lesion. Otherwise, signal intensity within the cervical spinal cord is normal.  Signal  intensity within the vertebral body bone marrow is within normal limits.  No significant degenerative disc bulge or disc protrusion identified within the cervical spine. No canal or neural foraminal stenosis.  Visualized paraspinous soft tissues are within normal limits.  IMPRESSION: MRI BRAIN:  1. No acute intracranial abnormality identified. 2. 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  MRA BRAIN:  1. Limited study due to motion. 2. No proximal branch occlusion or definite high-grade flow-limiting stenosis within the intracranial circulation.  MRI CERVICAL SPINE:  1. T2 hyperintense intra medullary cystic lesion extending from the C4 through T1-2 level as above. 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 Further imaging with post-contrast MRI of the cervical spine to evaluate for possible enhancement may be helpful for further evaluation. 2. No significant degenerative disc disease within the cervical spine. No canal or neural foraminal stenosis. Results were called by telephone at the time of interpretation on 01/30/2014 at 1:26 AM to Dr. Alexis Goodell, who verbally acknowledged these results.   Electronically Signed   By: Jeannine Boga M.D.   On: 01/30/2014 01:19   Mr Cervical Spine Wo Contrast  01/30/2014   CLINICAL DATA:  New onset flaccid paralysis.  EXAM: MRI HEAD WITHOUT CONTRAST  MRA HEAD WITHOUT CONTRAST  MRI CERVICAL SPINE WITHOUT CONTRAST  TECHNIQUE: Multiplanar, multiecho pulse sequences of the brain and surrounding structures were obtained without intravenous contrast. Angiographic images of the head were obtained using MRA technique without contrast.  COMPARISON:  None.  FINDINGS: MRI HEAD FINDINGS:  Study is degraded by motion artifact.  The CSF containing spaces are within normal limits for patient age. A few scattered T2/FLAIR hyperintense foci seen within the periventricular and deep white matter of both cerebral hemispheres, nonspecific, and of doubtful clinical significance. No mass lesion, midline shift, or extra-axial fluid collection. Ventricles are normal in size without evidence of hydrocephalus.  No diffusion-weighted signal abnormality is identified to suggest acute intracranial infarct. Gray-white matter differentiation is maintained. Normal flow voids are seen within the intracranial vasculature. No intracranial hemorrhage identified.  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 There compensatory mild beaking of the fourth ventricle.  Pituitary gland is within normal limits. Pituitary stalk is midline. The globes and optic  nerves demonstrate a normal appearance with normal signal intensity. The  The bone marrow signal intensity is normal. Calvarium is intact. Visualized upper cervical spine is within normal limits.  Scalp soft tissues are unremarkable.  Paranasal sinuses are clear.  No mastoid effusion.  MRA HEAD FINDINGS:  Study is markedly limited due to motion artifact.  Anterior circulation: Normal flow related enhancement of the included cervical, petrous, cavernous and supra clinoid internal carotid arteries. Patent anterior communicating artery. Normal flow related enhancement of the anterior and middle cerebral arteries, including more distal segments.  Posterior circulation: Left vertebral artery is dominant. Basilar artery is patent, with normal flow related enhancement of the main branch vessels. Normal flow related enhancement of the posterior cerebral arteries.  No large vessel occlusion, hemodynamically significant stenosis, abnormal luminal irregularity, aneurysm within the anterior nor posterior circulation.  MRI CERVICAL SPINE:  Study is limited by motion artifact.  There is gentle reversal of the normal cervical lordosis, likely related to patient positioning. Chiari 1 malformation noted, better evaluated on concomitant MRI  of the brain. T2 hyperintense intra medullary lesion within the cervical spinal cord extending from the level of C4 inferiorly through the T1-2 intervertebral disc space is compatible with an associated syrinx. This measures approximately in 1 cm in greatest transaxial diameter at the level of C6-7. No definite associated mass lesion. Otherwise, signal intensity within the cervical spinal cord is normal.  Signal intensity within the vertebral body bone marrow is within normal limits.  No significant degenerative disc bulge or disc protrusion identified within the cervical spine. No canal or neural foraminal stenosis.  Visualized paraspinous soft tissues are within normal limits.  IMPRESSION: MRI  BRAIN:  1. No acute intracranial abnormality identified. 2. 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  MRA BRAIN:  1. Limited study due to motion. 2. No proximal branch occlusion or definite high-grade flow-limiting stenosis within the intracranial circulation.  MRI CERVICAL SPINE:  1. T2 hyperintense intra medullary cystic lesion extending from the C4 through T1-2 level as above. 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 Further imaging with post-contrast MRI of the cervical spine to evaluate for possible enhancement may be helpful for further evaluation. 2. No significant degenerative disc disease within the cervical spine. No canal or neural foraminal stenosis. Results were called by telephone at the time of interpretation on 01/30/2014 at 1:26 AM to Dr. Alexis Goodell, who verbally acknowledged these results.   Electronically Signed   By: Jeannine Boga M.D.   On: 01/30/2014 01:19   Dg Chest Port 1 View  01/29/2014   CLINICAL DATA:  Lethargy, fatigue  EXAM: PORTABLE CHEST - 1 VIEW  COMPARISON:  10/02/2013  FINDINGS: Cardiac shadow is stable. The lungs are well aerated bilaterally. Mild patchy infiltrative density is noted in the right mid and upper lung. No acute bony abnormality is noted. Chronic changes of the distal left clavicle are again seen.  IMPRESSION: Patchy infiltrate in the right mid and upper lung. Followup films are recommended.   Electronically Signed   By: Inez Catalina M.D.   On: 01/29/2014 09:16   Dg Abd Portable 1v  01/29/2014   CLINICAL DATA:  Abdominal pain, hyperglycemia, history diabetes, gastroparesis  EXAM: PORTABLE ABDOMEN - 1 VIEW  COMPARISON:  Portable exam 2059 hr compared to 09/28/2013  FINDINGS: Slight motion artifact limits exam.  Dilated small bowel loop in the mid abdomen, nonspecific.   Remaining bowel loops normal.  No definite bowel wall thickening or urinary tract calcification.  Osseous structures unremarkable.  Multiple left pelvic phleboliths.  IMPRESSION: Single mildly distended small bowel loop in the left mid abdomen, nonspecific.  No other abnormalities identified.   Electronically Signed   By: Lavonia Dana M.D.   On: 01/29/2014 21:14   Dg Toe Great Left  01/29/2014   CLINICAL DATA:  Diabetic patient.  Skin ulceration.  EXAM: LEFT GREAT TOE  COMPARISON:  None.  FINDINGS: Soft tissues of the great toe appear swollen. No bony destructive change or soft tissue gas collection is identified. There is no fracture or dislocation.  IMPRESSION: Soft tissue swelling without plain film evidence of osteomyelitis or other acute bony abnormality.   Electronically Signed   By: Inge Rise M.D.   On: 01/29/2014 13:40   Dg Toe Great Right  01/29/2014   CLINICAL DATA:  Hyperglycemia  EXAM: RIGHT GREAT TOE  COMPARISON:  MRI 4  foot 01/29/2013 ; prior radiographs of the right foot 01/28/2013  FINDINGS: Tissue irregularity of the medial aspect of the great toe at the interphalangeal joint concerning for cellulitis or ulceration. Soft tissue swelling and bulging of the nail bed. No conventional radiographic evidence of osteomyelitis. No acute fracture or malalignment. Atherosclerotic calcifications noted in the visualized small vessels.  IMPRESSION: 1. Diffuse soft tissue swelling about the toe concerning for cellulitis. 2. No evidence of acute osseous abnormality.   Electronically Signed   By: Jacqulynn Cadet M.D.   On: 01/29/2014 13:41   Mr Jodene Nam Head/brain Wo Cm  01/30/2014   CLINICAL DATA:  New onset flaccid paralysis.  EXAM: MRI HEAD WITHOUT CONTRAST  MRA HEAD WITHOUT CONTRAST  MRI CERVICAL SPINE WITHOUT CONTRAST  TECHNIQUE: Multiplanar, multiecho pulse sequences of the brain and surrounding structures were obtained without intravenous contrast. Angiographic images of the head were obtained  using MRA technique without contrast.  COMPARISON:  None.  FINDINGS: MRI HEAD FINDINGS:  Study is degraded by motion artifact.  The CSF containing spaces are within normal limits for patient age. A few scattered T2/FLAIR hyperintense foci seen within the periventricular and deep white matter of both cerebral hemispheres, nonspecific, and of doubtful clinical significance. No mass lesion, midline shift, or extra-axial fluid collection. Ventricles are normal in size without evidence of hydrocephalus.  No diffusion-weighted signal abnormality is identified to suggest acute intracranial infarct. Gray-white matter differentiation is maintained. Normal flow voids are seen within the intracranial vasculature. No intracranial hemorrhage identified.  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 There compensatory mild beaking of the fourth ventricle.  Pituitary gland is within normal limits. Pituitary stalk is midline. The globes and optic nerves demonstrate a normal appearance with normal signal intensity. The  The bone marrow signal intensity is normal. Calvarium is intact. Visualized upper cervical spine is within normal limits.  Scalp soft tissues are unremarkable.  Paranasal sinuses are clear.  No mastoid effusion.  MRA HEAD FINDINGS:  Study is markedly limited due to motion artifact.  Anterior circulation: Normal flow related enhancement of the included cervical, petrous, cavernous and supra clinoid internal carotid arteries. Patent anterior communicating artery. Normal flow related enhancement of the anterior and middle cerebral arteries, including more distal segments.  Posterior circulation: Left vertebral artery is dominant. Basilar artery is patent, with normal flow related enhancement of the main branch vessels. Normal flow related enhancement of the posterior cerebral arteries.  No large vessel occlusion, hemodynamically  significant stenosis, abnormal luminal irregularity, aneurysm within the anterior nor posterior circulation.  MRI CERVICAL SPINE:  Study is limited by motion artifact.  There is gentle reversal of the normal cervical lordosis, likely related to patient positioning. Chiari 1 malformation noted, better evaluated on concomitant MRI of the brain. T2 hyperintense intra medullary lesion within the cervical spinal cord extending from the level of C4 inferiorly through the T1-2 intervertebral disc space is compatible with an associated syrinx. This measures approximately in 1 cm in greatest transaxial diameter at the level of C6-7. No definite associated mass lesion. Otherwise, signal intensity within the cervical spinal cord is normal.  Signal intensity within the vertebral body bone marrow is within normal limits.  No significant degenerative disc bulge or disc protrusion identified within the cervical spine. No canal or neural foraminal stenosis.  Visualized paraspinous soft tissues are within normal limits.  IMPRESSION: MRI BRAIN:  1. No acute intracranial abnormality identified. 2. Chiari 1  malformation with the cerebellar tonsils located 9 mm inferior to the foramen magnum.  MRA BRAIN:  1. Limited study due to motion. 2. No proximal branch occlusion or definite high-grade flow-limiting stenosis within the intracranial circulation.  MRI CERVICAL SPINE:  1. T2 hyperintense intra medullary cystic lesion extending from the C4 through T1-2 level as above. 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 Further imaging with post-contrast MRI of the cervical spine to evaluate for possible enhancement may be helpful for further evaluation. 2. No significant degenerative disc disease within the cervical spine. No canal or neural foraminal stenosis. Results were called by telephone at the time of  interpretation on 01/30/2014 at 1:26 AM to Dr. Alexis Goodell, who verbally acknowledged these results.   Electronically Signed   By: Jeannine Boga M.D.   On: 01/30/2014 01:19   Recent Results (from the past 240 hour(s))  CULTURE, BLOOD (ROUTINE X 2)     Status: None   Collection Time    01/29/14  9:15 AM      Result Value Ref Range Status   Specimen Description BLOOD LEFT ARM   Final   Special Requests BOTTLES DRAWN AEROBIC AND ANAEROBIC 5CC EACH   Final   Culture  Setup Time     Final   Value: 01/29/2014 14:12     Performed at [facility]   Culture     Final   Value:        BLOOD CULTURE RECEIVED NO GROWTH TO DATE CULTURE WILL BE HELD FOR 5 DAYS BEFORE ISSUING A FINAL NEGATIVE REPORT     Performed at [facility]   Report Status PENDING   Incomplete  URINE CULTURE     Status: None   Collection Time    01/29/14  9:28 AM      Result Value Ref Range Status   Specimen Description URINE, CATHETERIZED   Final   Special Requests NONE   Final   Culture  Setup Time     Final   Value: 01/29/2014 14:22     Performed at [facility]     Final   Value: NO GROWTH     Performed at [facility]   Culture     Final   Value: NO GROWTH     Performed at [facility]   Report Status 01/30/2014 FINAL   Final  CULTURE, BLOOD (ROUTINE X 2)     Status: None   Collection Time    01/29/14  9:35 AM      Result Value Ref Range Status   Specimen Description BLOOD RIGHT ARM   Final   Special Requests BOTTLES DRAWN AEROBIC AND ANAEROBIC 5CC BOTH   Final   Culture  Setup Time     Final   Value: 01/29/2014 14:12     Performed at [facility]   Culture     Final   Value:        BLOOD CULTURE RECEIVED NO GROWTH TO DATE CULTURE WILL BE HELD FOR 5 DAYS BEFORE ISSUING A FINAL NEGATIVE REPORT     Performed at [facility]   Report Status PENDING   Incomplete      01/30/2014, 11:48 AM     LOS: 1 day     **Disclaimer: This  note may have been dictated with voice recognition software.  Similar sounding words can inadvertently be transcribed and this note may contain transcription errors which may not have been corrected upon publication of note.**

## 2014-01-30 NOTE — Consult Note (Addendum)
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 associated syrinx. This measures approximately in 1 cm in greatest transaxial diameter at the level of C6-7. No definite associated mass lesion. Otherwise, signal intensity within the cervical spinal cord is normal.  Signal intensity within the vertebral body bone marrow is within normal limits.  No significant degenerative disc bulge or disc protrusion identified within the cervical spine. No canal or neural foraminal stenosis.  Visualized paraspinous soft tissues are within normal limits.  IMPRESSION: MRI BRAIN:  1. No acute intracranial abnormality identified. 2. 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  MRA BRAIN:  1. Limited study due to motion. 2. No proximal branch occlusion or definite high-grade flow-limiting stenosis within the intracranial circulation.  MRI CERVICAL SPINE:  1. T2 hyperintense intra medullary cystic lesion extending from the C4 through T1-2 level as above. 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 Further imaging with post-contrast MRI of the cervical spine to evaluate for possible enhancement may be helpful for further evaluation. 2. No significant degenerative disc disease within the cervical spine. No canal or neural foraminal stenosis. Results were called by telephone at the time of interpretation on 01/30/2014 at 1:26 AM to Dr. Alexis Goodell, who verbally acknowledged these results.   Electronically Signed   By: Jeannine Boga M.D.   On: 01/30/2014 01:19     Assessment/Plan: 29 year old male with LE weakness.  Has had upper extremity complaints as well per chart.  CT of the brain and lumbar spine were unremarkable.  MRI of the brain recommended and reviewed with radiology.  There is a Chiari I and some cord enlargement that may be a syrinx vs a TM.  Patient on antibiotics.  Would not start steroids at this time due to elevated blood sugars and active infection with elevated WBC count and fevers.  Concerned about LP with Chiari, current mental status and s/p SQ heparin.    Recommendations: 1.  Agree with antibiotics.  IF cord abnormalities secondary to infection it is unlikely that it is not the same as the systemic organism.  Would make sure drugs with CNS penetration.   2.  Will re-evaluate after meds have cleared and determine need for an LP at that time.      Alexis Goodell, MD [facility] [phone] 01/30/2014, 1:44 AM

## 2014-01-30 NOTE — Progress Notes (Signed)
PT Cancellation Note  Patient Details Name: Jason [name] MRN: [id number] DOB: 06/02/1986   Cancelled Treatment:    Reason Eval/Treat Not Completed: Patient not medically ready;Medical issues which prohibited therapy. Pt with progressive weakness at [facility] time. Planned for multiple tests today and possible transfer to ICU. Will hold therapy today.    [place], [place] 01/30/2014, 9:00 AM

## 2014-01-30 NOTE — Progress Notes (Addendum)
SLP Cancellation Note  Patient Details Name: Jason [name] MRN: [id number] DOB: 11/17/1985   Cancelled treatment:       Reason Eval/Treat Not Completed: Patient not medically ready;Patient's level of consciousness. Per chart pt not appropriate for cognitive linguistic eval today. Pts MRI brain also negative for infarct. SLE ordered as part of stroke order set. Is SLP eval needed? Please discontinue if not desired. Will leave order for now and f/u tomorrow.     Katherene Ponto Heily Carlucci 01/30/2014, 10:53 AM

## 2014-01-31 DIAGNOSIS — L97509 Non-pressure chronic ulcer of other part of unspecified foot with unspecified severity: Secondary | ICD-10-CM

## 2014-01-31 DIAGNOSIS — IMO0002 Reserved for concepts with insufficient information to code with codable children: Secondary | ICD-10-CM

## 2014-01-31 DIAGNOSIS — E1165 Type 2 diabetes mellitus with hyperglycemia: Secondary | ICD-10-CM

## 2014-01-31 DIAGNOSIS — E1169 Type 2 diabetes mellitus with other specified complication: Secondary | ICD-10-CM

## 2014-01-31 LAB — CBC
HEMATOCRIT: 24.7 % — AB (ref 39.0–52.0)
Hemoglobin: 8 g/dL — ABNORMAL LOW (ref 13.0–17.0)
MCH: 25.2 pg — ABNORMAL LOW (ref 26.0–34.0)
MCHC: 32.4 g/dL (ref 30.0–36.0)
MCV: 77.9 fL — ABNORMAL LOW (ref 78.0–100.0)
Platelets: 447 10*3/uL — ABNORMAL HIGH (ref 150–400)
RBC: 3.17 MIL/uL — AB (ref 4.22–5.81)
RDW: 14.7 % (ref 11.5–15.5)
WBC: 33.5 10*3/uL — ABNORMAL HIGH (ref 4.0–10.5)

## 2014-01-31 LAB — GLUCOSE, CAPILLARY
GLUCOSE-CAPILLARY: 183 mg/dL — AB (ref 70–99)
GLUCOSE-CAPILLARY: 245 mg/dL — AB (ref 70–99)
Glucose-Capillary: 170 mg/dL — ABNORMAL HIGH (ref 70–99)
Glucose-Capillary: 260 mg/dL — ABNORMAL HIGH (ref 70–99)
Glucose-Capillary: 249 mg/dL — ABNORMAL HIGH (ref 70–99)

## 2014-01-31 LAB — IRON AND TIBC
IRON: 39 ug/dL — AB (ref 42–135)
Saturation Ratios: 21 % (ref 20–55)
TIBC: 184 ug/dL — AB (ref 215–435)
UIBC: 145 ug/dL (ref 125–400)

## 2014-01-31 LAB — FOLATE: Folate: 15.7 ng/mL

## 2014-01-31 LAB — BASIC METABOLIC PANEL
BUN: 14 mg/dL (ref 6–23)
CALCIUM: 8.2 mg/dL — AB (ref 8.4–10.5)
CO2: 24 meq/L (ref 19–32)
Chloride: 103 mEq/L (ref 96–112)
Creatinine, Ser: 0.79 mg/dL (ref 0.50–1.35)
GFR calc Af Amer: >90 mL/min (ref >90)
GFR calc non Af Amer: >90 mL/min (ref >90)
GLUCOSE: 163 mg/dL — AB (ref 70–99)
Potassium: 3.8 mEq/L (ref 3.7–5.3)
Sodium: 138 mEq/L (ref 137–147)

## 2014-01-31 LAB — FUNGAL STAIN: FUNGAL SMEAR: NONE SEEN

## 2014-01-31 LAB — PROTEIN AND GLUCOSE, CSF
Glucose, CSF: 132 mg/dL — ABNORMAL HIGH (ref 43–76)
TOTAL PROTEIN, CSF: 111 mg/dL — AB (ref 15–45)

## 2014-01-31 LAB — VITAMIN B12: Vitamin B-12: 904 pg/mL (ref 211–911)

## 2014-01-31 LAB — RETICULOCYTES
RBC.: 3.15 MIL/uL — ABNORMAL LOW (ref 4.22–5.81)
RETIC CT PCT: 0.8 % (ref 0.4–3.1)
Retic Count, Absolute: 25.2 10*3/uL (ref 19.0–186.0)

## 2014-01-31 LAB — FERRITIN: FERRITIN: 300 ng/mL (ref 22–322)

## 2014-01-31 MED ORDER — POLYETHYLENE GLYCOL 3350 17 G PO PACK
17.0000 g | PACK | Freq: Every day | ORAL | Status: DC | PRN
  Administered 2014-02-05: 17 g via ORAL
  Filled 2014-01-31 (×2): qty 1

## 2014-01-31 MED ORDER — OXYCODONE HCL 5 MG PO TABS
10.0000 mg | ORAL_TABLET | ORAL | Status: DC | PRN
  Administered 2014-01-31 – 2014-02-01 (×3): 10 mg via ORAL
  Filled 2014-01-31 (×3): qty 2

## 2014-01-31 MED ORDER — MORPHINE SULFATE ER 15 MG PO TBCR
30.0000 mg | EXTENDED_RELEASE_TABLET | Freq: Two times a day (BID) | ORAL | Status: DC
  Administered 2014-01-31 – 2014-02-05 (×10): 30 mg via ORAL
  Filled 2014-01-31 (×10): qty 2

## 2014-01-31 MED ORDER — COLLAGENASE 250 UNIT/GM EX OINT
TOPICAL_OINTMENT | Freq: Every day | CUTANEOUS | Status: DC
  Administered 2014-01-31 – 2014-02-01 (×2): via TOPICAL
  Administered 2014-02-03: 1 via TOPICAL
  Administered 2014-02-05: 10:00:00 via TOPICAL
  Filled 2014-01-31 (×2): qty 30

## 2014-01-31 MED ORDER — VANCOMYCIN HCL IN DEXTROSE 1-5 GM/200ML-% IV SOLN
1000.0000 mg | Freq: Three times a day (TID) | INTRAVENOUS | Status: DC
  Administered 2014-01-31 – 2014-02-02 (×6): 1000 mg via INTRAVENOUS
  Filled 2014-01-31 (×9): qty 200

## 2014-01-31 MED ORDER — INSULIN GLARGINE 100 UNIT/ML ~~LOC~~ SOLN
10.0000 [IU] | Freq: Two times a day (BID) | SUBCUTANEOUS | Status: AC
  Administered 2014-01-31 – 2014-02-01 (×3): 10 [IU] via SUBCUTANEOUS
  Filled 2014-01-31 (×3): qty 0.1

## 2014-01-31 MED ORDER — INSULIN ASPART 100 UNIT/ML ~~LOC~~ SOLN
0.0000 [IU] | SUBCUTANEOUS | Status: DC
  Administered 2014-01-31 (×2): 4 [IU] via SUBCUTANEOUS
  Administered 2014-01-31: 11 [IU] via SUBCUTANEOUS
  Administered 2014-01-31 – 2014-02-01 (×3): 7 [IU] via SUBCUTANEOUS
  Administered 2014-02-01 (×2): 4 [IU] via SUBCUTANEOUS
  Administered 2014-02-01 (×2): 7 [IU] via SUBCUTANEOUS
  Administered 2014-02-01 – 2014-02-02 (×2): 11 [IU] via SUBCUTANEOUS
  Administered 2014-02-02: 4 [IU] via SUBCUTANEOUS
  Administered 2014-02-02: 7 [IU] via SUBCUTANEOUS
  Administered 2014-02-02: 4 [IU] via SUBCUTANEOUS
  Administered 2014-02-02: 3 [IU] via SUBCUTANEOUS
  Administered 2014-02-03: 11 [IU] via SUBCUTANEOUS
  Administered 2014-02-03: 4 [IU] via SUBCUTANEOUS
  Administered 2014-02-03: 3 [IU] via SUBCUTANEOUS
  Administered 2014-02-03: 15 [IU] via SUBCUTANEOUS

## 2014-01-31 MED ORDER — SODIUM CHLORIDE 0.9 % IV SOLN
500.0000 mg | Freq: Two times a day (BID) | INTRAVENOUS | Status: AC
  Administered 2014-01-31 – 2014-02-02 (×4): 500 mg via INTRAVENOUS
  Filled 2014-01-31 (×6): qty 4

## 2014-01-31 NOTE — Progress Notes (Signed)
ANTIBIOTIC CONSULT NOTE - FOLLOW UP  Pharmacy Consult for Vancomycin + Aztreonam + Acyclovir Indication: Cellulitis, r/o PNA, and r/o infectious causes of transverse myelitis  Allergies  Allergen Reactions  . Cefuroxime Axetil Anaphylaxis  . Penicillins Anaphylaxis    ?can take amoxicillin?  [name] [Benzonatate] Anaphylaxis  . Shellfish Allergy Itching    Patient Measurements: Height: 5' 7.5" (171.5 cm) Weight: 141 lb 8.6 oz (64.2 kg) IBW/kg (Calculated) : 67.25  Vital Signs: Temp: 100.5 F (38.1 C) (04/26 0405) Temp src: Oral (04/26 0405) BP: 121/74 mmHg (04/26 1100) Pulse Rate: 87 (04/26 1100) Intake/Output from previous day: 04/25 0701 - 04/26 0700 In: 2585.6 [I.V.:1755; IV Piggyback:830.6] Out: 1505 [Urine:1505] Intake/Output from this shift: Total I/O In: 766.8 [P.O.:350; I.V.:150; IV Piggyback:266.8] Out: 240 [Urine:240]  Labs:  Recent Labs  01/29/14 0858 01/29/14 2113 01/30/14 0428 01/31/14 0250  WBC 32.0*  --  33.6* 33.5*  HGB 9.4*  --  8.4* 8.0*  PLT 462*  --  434* 447*  CREATININE 1.60* 0.98 0.94 0.79   Estimated Creatinine Clearance: 123.7 ml/min (by C-G formula based on Cr of 0.79). No results found for this basename: VANCOTROUGH, VANCOPEAK, VANCORANDOM, [place], [place], GENTRANDOM, [place], TOBRAPEAK, TOBRARND, AMIKACINPEAK, AMIKACINTROU, AMIKACIN,  in the last 72 hours   Microbiology: Recent Results (from the past 720 hour(s))  CULTURE, BLOOD (ROUTINE X 2)     Status: None   Collection Time    01/29/14  9:15 AM      Result Value Ref Range Status   Specimen Description BLOOD LEFT ARM   Final   Special Requests BOTTLES DRAWN AEROBIC AND ANAEROBIC 5CC EACH   Final   Culture  Setup Time     Final   Value: 01/29/2014 14:12     Performed at [facility]   Culture     Final   Value:        BLOOD CULTURE RECEIVED NO GROWTH TO DATE CULTURE WILL BE HELD FOR 5 DAYS BEFORE ISSUING A FINAL NEGATIVE REPORT     Performed at [facility]   Report Status PENDING   Incomplete  URINE CULTURE     Status: None   Collection Time    01/29/14  9:28 AM      Result Value Ref Range Status   Specimen Description URINE, CATHETERIZED   Final   Special Requests NONE   Final   Culture  Setup Time     Final   Value: 01/29/2014 14:22     Performed at [facility]     Final   Value: NO GROWTH     Performed at [facility]   Culture     Final   Value: NO GROWTH     Performed at [facility]   Report Status 01/30/2014 FINAL   Final  CULTURE, BLOOD (ROUTINE X 2)     Status: None   Collection Time    01/29/14  9:35 AM      Result Value Ref Range Status   Specimen Description BLOOD RIGHT ARM   Final   Special Requests BOTTLES DRAWN AEROBIC AND ANAEROBIC 5CC BOTH   Final   Culture  Setup Time     Final   Value: 01/29/2014 14:12     Performed at [facility]   Culture     Final   Value:        BLOOD CULTURE RECEIVED NO GROWTH TO DATE CULTURE WILL  BE HELD FOR 5 DAYS BEFORE ISSUING A FINAL NEGATIVE REPORT     Performed at [facility]   Report Status PENDING   Incomplete  MRSA PCR SCREENING     Status: None   Collection Time    01/30/14 11:59 AM      Result Value Ref Range Status   MRSA by PCR NEGATIVE  NEGATIVE Final   Comment:            The GeneXpert MRSA Assay (FDA     approved for NASAL specimens     only), is one component of a     comprehensive MRSA colonization     surveillance program. It is not     intended to diagnose MRSA     infection nor to guide or     monitor treatment for     MRSA infections.  GRAM STAIN     Status: None   Collection Time    01/30/14 10:45 PM      Result Value Ref Range Status   Specimen Description CSF   Final   Special Requests NO4 [facility]   Final   Gram Stain     Final   Value: WBC PRESENT,BOTH PMN AND MONONUCLEAR     NO ORGANISMS SEEN     CYTOSPIN SLIDE   Report Status 01/30/2014 FINAL   Final  CSF CULTURE     Status:  None   Collection Time    01/30/14 10:45 PM      Result Value Ref Range Status   Specimen Description CSF   Final   Special Requests CSF NO4 [place]   Final   Gram Stain     Final   Value: CYTOSPIN SLIDE WBC PRESENT,BOTH PMN AND MONONUCLEAR     NO ORGANISMS SEEN     Performed at [facility]     Performed at [facility]   Culture PENDING   Incomplete   Report Status PENDING   Incomplete    Anti-infectives   Start     Dose/Rate Route Frequency Ordered Stop   01/30/14 1600  acyclovir (ZOVIRAX) 640 mg in dextrose 5 % 100 mL IVPB     640 mg 112.8 mL/hr over 60 Minutes Intravenous Every 8 hours 01/30/14 1431     01/30/14 1300  levofloxacin (LEVAQUIN) IVPB 500 mg     500 mg 100 mL/hr over 60 Minutes Intravenous Every 24 hours 01/30/14 1248     01/30/14 1245  fluconazole (DIFLUCAN) IVPB 100 mg     100 mg 50 mL/hr over 60 Minutes Intravenous Every 24 hours 01/30/14 1248     01/29/14 2200  vancomycin (VANCOCIN) 500 mg in sodium chloride 0.9 % 100 mL IVPB  Status:  Discontinued     500 mg 100 mL/hr over 60 Minutes Intravenous Every 12 hours 01/29/14 0952 01/29/14 2022   01/29/14 2200  vancomycin (VANCOCIN) 500 mg in sodium chloride 0.9 % 100 mL IVPB     500 mg 100 mL/hr over 60 Minutes Intravenous Every 12 hours 01/29/14 2034     01/29/14 2100  aztreonam (AZACTAM) 1 g in dextrose 5 % 50 mL IVPB     1 g 100 mL/hr over 30 Minutes Intravenous 3 times per day 01/29/14 2031     01/29/14 1000  vancomycin (VANCOCIN) IVPB 1000 mg/200 mL premix     1,000 mg 200 mL/hr over 60 Minutes Intravenous STAT 01/29/14 0952 01/29/14 1237   01/29/14 0945  aztreonam (AZACTAM) 2  g in dextrose 5 % 50 mL IVPB  Status:  Discontinued     2 g 100 mL/hr over 30 Minutes Intravenous  Once 01/29/14 0939 01/29/14 2022      Assessment: 14 YOM who continues on Vancomycin + Aztreonam + Acyclovir per Rx and Levaquin + Fluconazole per MD for cellulitis, r/o PNA, and r/o infectious causes of transverse  myelitis. ID is on board and have ordered several tests/cultures - will await results and plan for de-escalation. Renal function has noted to improve from admission - so will plan to adjust Vancomycin dose today.  Goal of Therapy:  Vancomycin trough level 15-20 mcg/ml Proper antibiotics for infection/cultures adjusted for renal/hepatic function   Plan:  1. Increase Vancomycin to 1g IV every 8 hours 2. Continue Aztreonam 1g IV every 8 hours 3. Continue Acyclovir 640 mg IV every 8 hours 4. Will continue to follow renal function, culture results, LOT, and antibiotic de-escalation plans   Alycia Rossetti, PharmD, BCPS Clinical Pharmacist Pager: [phone] 01/31/2014 12:32 PM

## 2014-01-31 NOTE — Progress Notes (Signed)
INFECTIOUS DISEASE PROGRESS NOTE  ID: Jason [name] is a 28 y.o. male with  Principal Problem:   HCAP (healthcare-associated pneumonia) Active Problems:   Type 1 diabetes, uncontrolled, with neuropathy   GERD   Painful diabetic neuropathy   Chronic low back pain   Gastroparesis due to DM   Protein-calorie malnutrition, severe   Leg weakness   Bilateral lower leg cellulitis   Altered mental status   Transverse myelitis  Subjective: Temp 1005 in last 24h.  Without complaints, feels like his strength is better.  No dysphagia.  Abtx:  Anti-infectives   Start     Dose/Rate Route Frequency Ordered Stop   01/30/14 1600  acyclovir (ZOVIRAX) 640 mg in dextrose 5 % 100 mL IVPB     640 mg 112.8 mL/hr over 60 Minutes Intravenous Every 8 hours 01/30/14 1431     01/30/14 1300  levofloxacin (LEVAQUIN) IVPB 500 mg     500 mg 100 mL/hr over 60 Minutes Intravenous Every 24 hours 01/30/14 1248     01/30/14 1245  fluconazole (DIFLUCAN) IVPB 100 mg     100 mg 50 mL/hr over 60 Minutes Intravenous Every 24 hours 01/30/14 1248     01/29/14 2200  vancomycin (VANCOCIN) 500 mg in sodium chloride 0.9 % 100 mL IVPB  Status:  Discontinued     500 mg 100 mL/hr over 60 Minutes Intravenous Every 12 hours 01/29/14 0952 01/29/14 2022   01/29/14 2200  vancomycin (VANCOCIN) 500 mg in sodium chloride 0.9 % 100 mL IVPB     500 mg 100 mL/hr over 60 Minutes Intravenous Every 12 hours 01/29/14 2034     01/29/14 2100  aztreonam (AZACTAM) 1 g in dextrose 5 % 50 mL IVPB     1 g 100 mL/hr over 30 Minutes Intravenous 3 times per day 01/29/14 2031     01/29/14 1000  vancomycin (VANCOCIN) IVPB 1000 mg/200 mL premix     1,000 mg 200 mL/hr over 60 Minutes Intravenous STAT 01/29/14 0952 01/29/14 1237   01/29/14 0945  aztreonam (AZACTAM) 2 g in dextrose 5 % 50 mL IVPB  Status:  Discontinued     2 g 100 mL/hr over 30 Minutes Intravenous  Once 01/29/14 0939 01/29/14 2022      Medications:  Scheduled: .  acyclovir  640 mg Intravenous Q8H  . aztreonam  1 g Intravenous 3 times per day  . chlorhexidine  15 mL Mouth Rinse BID  . fluconazole (DIFLUCAN) IV  100 mg Intravenous Q24H  . insulin aspart  0-20 Units Subcutaneous 6 times per day  . insulin glargine  10 Units Subcutaneous BID  . levofloxacin (LEVAQUIN) IV  500 mg Intravenous Q24H  . lidocaine  1 patch Transdermal Q24H  . methylPREDNISolone (SOLU-MEDROL) injection  500 mg Intravenous Q12H  . sodium chloride  3 mL Intravenous Q12H  . vancomycin  500 mg Intravenous Q12H    Objective: Vital signs in last 24 hours: Temp:  [98.3 F (36.8 C)-100.5 F (38.1 C)] 100.5 F (38.1 C) (04/26 0405) Pulse Rate:  [89-100] 94 (04/26 0800) Resp:  [16-24] 19 (04/26 0800) BP: (109-143)/(69-86) 119/70 mmHg (04/26 0800) SpO2:  [95 %-100 %] 100 % (04/26 0800) Weight:  [64.2 kg (141 lb 8.6 oz)] 64.2 kg (141 lb 8.6 oz) (04/26 0400)   General appearance: alert, cooperative and no distress Eyes: negative findings: EOMI Throat: normal findings: oropharynx pink & moist without lesions or evidence of thrush Resp: clear to auscultation bilaterally Cardio: regular rate  and rhythm GI: normal findings: bowel sounds normal and soft, non-tender Neurologic: Motor: some improvement LUE. no movement BLE.   Lab Results  Recent Labs  01/30/14 0428 01/31/14 0250  WBC 33.6* 33.5*  HGB 8.4* 8.0*  HCT 25.6* 24.7*  NA 138 138  K 4.2 3.8  CL 104 103  CO2 21 24  BUN 20 14  CREATININE 0.94 0.79   Liver Panel  Recent Labs  01/29/14 2113 01/30/14 0428  PROT 6.8 6.7  ALBUMIN 1.5* 1.5*  AST 12 9  ALT 8 7  ALKPHOS 190* 171*  BILITOT 0.3 0.3   Sedimentation Rate  Recent Labs  01/30/14 1440  ESRSEDRATE 102*   C-Reactive Protein  Recent Labs  01/29/14 2113 01/30/14 1440  CRP 29.4* 26.1*    Microbiology: Recent Results (from the past 240 hour(s))  CULTURE, BLOOD (ROUTINE X 2)     Status: None   Collection Time    01/29/14  9:15 AM       Result Value Ref Range Status   Specimen Description BLOOD LEFT ARM   Final   Special Requests BOTTLES DRAWN AEROBIC AND ANAEROBIC 5CC EACH   Final   Culture  Setup Time     Final   Value: 01/29/2014 14:12     Performed at [facility]   Culture     Final   Value:        BLOOD CULTURE RECEIVED NO GROWTH TO DATE CULTURE WILL BE HELD FOR 5 DAYS BEFORE ISSUING A FINAL NEGATIVE REPORT     Performed at [facility]   Report Status PENDING   Incomplete  URINE CULTURE     Status: None   Collection Time    01/29/14  9:28 AM      Result Value Ref Range Status   Specimen Description URINE, CATHETERIZED   Final   Special Requests NONE   Final   Culture  Setup Time     Final   Value: 01/29/2014 14:22     Performed at [place]     Final   Value: NO GROWTH     Performed at [facility]   Culture     Final   Value: NO GROWTH     Performed at [facility]   Report Status 01/30/2014 FINAL   Final  CULTURE, BLOOD (ROUTINE X 2)     Status: None   Collection Time    01/29/14  9:35 AM      Result Value Ref Range Status   Specimen Description BLOOD RIGHT ARM   Final   Special Requests BOTTLES DRAWN AEROBIC AND ANAEROBIC 5CC BOTH   Final   Culture  Setup Time     Final   Value: 01/29/2014 14:12     Performed at [facility]   Culture     Final   Value:        BLOOD CULTURE RECEIVED NO GROWTH TO DATE CULTURE WILL BE HELD FOR 5 DAYS BEFORE ISSUING A FINAL NEGATIVE REPORT     Performed at [facility]   Report Status PENDING   Incomplete  MRSA PCR SCREENING     Status: None   Collection Time    01/30/14 11:59 AM      Result Value Ref Range Status   MRSA by PCR NEGATIVE  NEGATIVE Final   Comment:            The GeneXpert MRSA  Assay (FDA     approved for NASAL specimens     only), is one component of a     comprehensive MRSA colonization     surveillance program. It is not     intended to diagnose MRSA     infection nor  to guide or     monitor treatment for     MRSA infections.  GRAM STAIN     Status: None   Collection Time    01/30/14 10:45 PM      Result Value Ref Range Status   Specimen Description CSF   Final   Special Requests NO4 [facility]   Final   Gram Stain     Final   Value: WBC PRESENT,BOTH PMN AND MONONUCLEAR     NO ORGANISMS SEEN     CYTOSPIN SLIDE   Report Status 01/30/2014 FINAL   Final  CSF CULTURE     Status: None   Collection Time    01/30/14 10:45 PM      Result Value Ref Range Status   Specimen Description CSF   Final   Special Requests CSF NO4 [place]   Final   Gram Stain     Final   Value: CYTOSPIN SLIDE WBC PRESENT,BOTH PMN AND MONONUCLEAR     NO ORGANISMS SEEN     Performed at [facility]     Performed at [facility]   Culture PENDING   Incomplete   Report Status PENDING   Incomplete    Studies/Results: Ct Head Wo Contrast  01/29/2014   CLINICAL DATA:  Hyperglycemia.  Diabetes.  EXAM: CT HEAD WITHOUT CONTRAST  TECHNIQUE: Contiguous axial images were obtained from the base of the skull through the vertex without intravenous contrast.  COMPARISON:  None.  FINDINGS: Image quality degraded by mild motion.  Ventricle size is normal. Small hypodensity left frontal white matter, likely due to chronic ischemia. Negative for acute infarct, hemorrhage, or mass lesion. Calvarium intact. Visualized sinuses are clear.  IMPRESSION: Small hypodensity left frontal white matter, most consistent with mild chronic microvascular ischemia. No acute abnormality.   Electronically Signed   By: Franchot Gallo M.D.   On: 01/29/2014 11:16   Ct Lumbar Spine Wo Contrast  01/29/2014   CLINICAL DATA:  Chronic neuropathy. Diabetes. Bilateral leg numbness. Hyperglycemia.  EXAM: CT LUMBAR SPINE WITHOUT CONTRAST  TECHNIQUE: Multidetector CT imaging of the lumbar spine was performed without intravenous contrast administration. Multiplanar CT image reconstructions were also generated.  COMPARISON:  None.   FINDINGS: Negative for fracture or mass. Disc spaces are normal. No degenerative change. Facet joints are normal.  No soft tissue swelling. The psoas muscle and retroperitoneum are normal.  No evidence of spinal infection. MRI with contrast is more sensitive than CT for detection of spinal infection.  IMPRESSION: Normal   Electronically Signed   By: Franchot Gallo M.D.   On: 01/29/2014 11:28   Mr Brain Wo Contrast  01/30/2014   CLINICAL DATA:  New onset flaccid paralysis.  EXAM: MRI HEAD WITHOUT CONTRAST  MRA HEAD WITHOUT CONTRAST  MRI CERVICAL SPINE WITHOUT CONTRAST  TECHNIQUE: Multiplanar, multiecho pulse sequences of the brain and surrounding structures were obtained without intravenous contrast. Angiographic images of the head were obtained using MRA technique without contrast.  COMPARISON:  None.  FINDINGS: MRI HEAD FINDINGS:  Study is degraded by motion artifact.  The CSF containing spaces are within normal limits for patient age. A few scattered T2/FLAIR hyperintense foci seen within the  periventricular and deep white matter of both cerebral hemispheres, nonspecific, and of doubtful clinical significance. No mass lesion, midline shift, or extra-axial fluid collection. Ventricles are normal in size without evidence of hydrocephalus.  No diffusion-weighted signal abnormality is identified to suggest acute intracranial infarct. Gray-white matter differentiation is maintained. Normal flow voids are seen within the intracranial vasculature. No intracranial hemorrhage identified.  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 There compensatory mild beaking of the fourth ventricle.  Pituitary gland is within normal limits. Pituitary stalk is midline. The globes and optic nerves demonstrate a normal appearance with normal signal intensity. The  The bone marrow signal intensity is normal. Calvarium is intact. Visualized upper  cervical spine is within normal limits.  Scalp soft tissues are unremarkable.  Paranasal sinuses are clear.  No mastoid effusion.  MRA HEAD FINDINGS:  Study is markedly limited due to motion artifact.  Anterior circulation: Normal flow related enhancement of the included cervical, petrous, cavernous and supra clinoid internal carotid arteries. Patent anterior communicating artery. Normal flow related enhancement of the anterior and middle cerebral arteries, including more distal segments.  Posterior circulation: Left vertebral artery is dominant. Basilar artery is patent, with normal flow related enhancement of the main branch vessels. Normal flow related enhancement of the posterior cerebral arteries.  No large vessel occlusion, hemodynamically significant stenosis, abnormal luminal irregularity, aneurysm within the anterior nor posterior circulation.  MRI CERVICAL SPINE:  Study is limited by motion artifact.  There is gentle reversal of the normal cervical lordosis, likely related to patient positioning. Chiari 1 malformation noted, better evaluated on concomitant MRI of the brain. T2 hyperintense intra medullary lesion within the cervical spinal cord extending from the level of C4 inferiorly through the T1-2 intervertebral disc space is compatible with an associated syrinx. This measures approximately in 1 cm in greatest transaxial diameter at the level of C6-7. No definite associated mass lesion. Otherwise, signal intensity within the cervical spinal cord is normal.  Signal intensity within the vertebral body bone marrow is within normal limits.  No significant degenerative disc bulge or disc protrusion identified within the cervical spine. No canal or neural foraminal stenosis.  Visualized paraspinous soft tissues are within normal limits.  IMPRESSION: MRI BRAIN:  1. No acute intracranial abnormality identified. 2. 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   MRA BRAIN:  1. Limited study due to motion. 2. No proximal branch occlusion or definite high-grade flow-limiting stenosis within the intracranial circulation.  MRI CERVICAL SPINE:  1. T2 hyperintense intra medullary cystic lesion extending from the C4 through T1-2 level as above. 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 Further imaging with post-contrast MRI of the cervical spine to evaluate for possible enhancement may be helpful for further evaluation. 2. No significant degenerative disc disease within the cervical spine. No canal or neural foraminal stenosis. Results were called by telephone at the time of interpretation on 01/30/2014 at 1:26 AM to Dr. Alexis Goodell, who verbally acknowledged these results.   Electronically Signed   By: Jeannine Boga M.D.   On: 01/30/2014 01:19   Mr Cervical Spine Wo Contrast  01/30/2014   CLINICAL DATA:  New onset flaccid paralysis.  EXAM: MRI HEAD WITHOUT CONTRAST  MRA HEAD WITHOUT CONTRAST  MRI CERVICAL SPINE WITHOUT CONTRAST  TECHNIQUE:  Multiplanar, multiecho pulse sequences of the brain and surrounding structures were obtained without intravenous contrast. Angiographic images of the head were obtained using MRA technique without contrast.  COMPARISON:  None.  FINDINGS: MRI HEAD FINDINGS:  Study is degraded by motion artifact.  The CSF containing spaces are within normal limits for patient age. A few scattered T2/FLAIR hyperintense foci seen within the periventricular and deep white matter of both cerebral hemispheres, nonspecific, and of doubtful clinical significance. No mass lesion, midline shift, or extra-axial fluid collection. Ventricles are normal in size without evidence of hydrocephalus.  No diffusion-weighted signal abnormality is identified to suggest acute intracranial infarct. Gray-white matter differentiation is  maintained. Normal flow voids are seen within the intracranial vasculature. No intracranial hemorrhage identified.  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 There compensatory mild beaking of the fourth ventricle.  Pituitary gland is within normal limits. Pituitary stalk is midline. The globes and optic nerves demonstrate a normal appearance with normal signal intensity. The  The bone marrow signal intensity is normal. Calvarium is intact. Visualized upper cervical spine is within normal limits.  Scalp soft tissues are unremarkable.  Paranasal sinuses are clear.  No mastoid effusion.  MRA HEAD FINDINGS:  Study is markedly limited due to motion artifact.  Anterior circulation: Normal flow related enhancement of the included cervical, petrous, cavernous and supra clinoid internal carotid arteries. Patent anterior communicating artery. Normal flow related enhancement of the anterior and middle cerebral arteries, including more distal segments.  Posterior circulation: Left vertebral artery is dominant. Basilar artery is patent, with normal flow related enhancement of the main branch vessels. Normal flow related enhancement of the posterior cerebral arteries.  No large vessel occlusion, hemodynamically significant stenosis, abnormal luminal irregularity, aneurysm within the anterior nor posterior circulation.  MRI CERVICAL SPINE:  Study is limited by motion artifact.  There is gentle reversal of the normal cervical lordosis, likely related to patient positioning. Chiari 1 malformation noted, better evaluated on concomitant MRI of the brain. T2 hyperintense intra medullary lesion within the cervical spinal cord extending from the level of C4 inferiorly through the T1-2 intervertebral disc space is compatible with an associated syrinx. This measures approximately in 1 cm in greatest transaxial diameter at the level of C6-7. No definite  associated mass lesion. Otherwise, signal intensity within the cervical spinal cord is normal.  Signal intensity within the vertebral body bone marrow is within normal limits.  No significant degenerative disc bulge or disc protrusion identified within the cervical spine. No canal or neural foraminal stenosis.  Visualized paraspinous soft tissues are within normal limits.  IMPRESSION: MRI BRAIN:  1. No acute intracranial abnormality identified. 2. 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  MRA BRAIN:  1. Limited study due to motion. 2. No proximal branch occlusion or definite high-grade flow-limiting stenosis within the intracranial circulation.  MRI CERVICAL SPINE:  1. T2 hyperintense intra medullary cystic lesion extending from the C4 through T1-2 level as above. 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 Further imaging with post-contrast MRI of the cervical spine to evaluate for possible enhancement may be helpful for further evaluation. 2. No significant degenerative disc disease within the cervical spine. No canal or neural foraminal stenosis. Results were called by telephone at the time of interpretation on 01/30/2014 at  1:26 AM to Dr. Alexis Goodell, who verbally acknowledged these results.   Electronically Signed   By: Jeannine Boga M.D.   On: 01/30/2014 01:19   Dg Abd Portable 1v  01/29/2014   CLINICAL DATA:  Abdominal pain, hyperglycemia, history diabetes, gastroparesis  EXAM: PORTABLE ABDOMEN - 1 VIEW  COMPARISON:  Portable exam 2059 hr compared to 09/28/2013  FINDINGS: Slight motion artifact limits exam.  Dilated small bowel loop in the mid abdomen, nonspecific.  Remaining bowel loops normal.  No definite bowel wall thickening or urinary tract calcification.  Osseous structures unremarkable.  Multiple left pelvic  phleboliths.  IMPRESSION: Single mildly distended small bowel loop in the left mid abdomen, nonspecific.  No other abnormalities identified.   Electronically Signed   By: Lavonia Dana M.D.   On: 01/29/2014 21:14   Dg Toe Great Left  01/29/2014   CLINICAL DATA:  Diabetic patient.  Skin ulceration.  EXAM: LEFT GREAT TOE  COMPARISON:  None.  FINDINGS: Soft tissues of the great toe appear swollen. No bony destructive change or soft tissue gas collection is identified. There is no fracture or dislocation.  IMPRESSION: Soft tissue swelling without plain film evidence of osteomyelitis or other acute bony abnormality.   Electronically Signed   By: Inge Rise M.D.   On: 01/29/2014 13:40   Dg Toe Great Right  01/29/2014   CLINICAL DATA:  Hyperglycemia  EXAM: RIGHT GREAT TOE  COMPARISON:  MRI 4 foot 01/29/2013 ; prior radiographs of the right foot 01/28/2013  FINDINGS: Tissue irregularity of the medial aspect of the great toe at the interphalangeal joint concerning for cellulitis or ulceration. Soft tissue swelling and bulging of the nail bed. No conventional radiographic evidence of osteomyelitis. No acute fracture or malalignment. Atherosclerotic calcifications noted in the visualized small vessels.  IMPRESSION: 1. Diffuse soft tissue swelling about the toe concerning for cellulitis. 2. No evidence of acute osseous abnormality.   Electronically Signed   By: Jacqulynn Cadet M.D.   On: 01/29/2014 13:41   Mr Jodene Nam Head/brain Wo Cm  01/30/2014   CLINICAL DATA:  New onset flaccid paralysis.  EXAM: MRI HEAD WITHOUT CONTRAST  MRA HEAD WITHOUT CONTRAST  MRI CERVICAL SPINE WITHOUT CONTRAST  TECHNIQUE: Multiplanar, multiecho pulse sequences of the brain and surrounding structures were obtained without intravenous contrast. Angiographic images of the head were obtained using MRA technique without contrast.  COMPARISON:  None.  FINDINGS: MRI HEAD FINDINGS:  Study is degraded by motion artifact.  The CSF containing spaces  are within normal limits for patient age. A few scattered T2/FLAIR hyperintense foci seen within the periventricular and deep white matter of both cerebral hemispheres, nonspecific, and of doubtful clinical significance. No mass lesion, midline shift, or extra-axial fluid collection. Ventricles are normal in size without evidence of hydrocephalus.  No diffusion-weighted signal abnormality is identified to suggest acute intracranial infarct. Gray-white matter differentiation is maintained. Normal flow voids are seen within the intracranial vasculature. No intracranial hemorrhage identified.  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 There compensatory mild beaking of the fourth ventricle.  Pituitary gland is within normal limits. Pituitary stalk is midline. The globes and optic nerves demonstrate a normal appearance with normal signal intensity. The  The bone marrow signal intensity is normal. Calvarium is intact. Visualized upper cervical spine is within normal limits.  Scalp soft tissues are unremarkable.  Paranasal sinuses are clear.  No mastoid effusion.  MRA HEAD FINDINGS:  Study is markedly limited due to motion artifact.  Anterior circulation: Normal flow related enhancement of the included cervical, petrous, cavernous and supra clinoid internal carotid arteries. Patent anterior communicating artery. Normal flow related enhancement of the anterior and middle cerebral arteries, including more distal segments.  Posterior circulation: Left vertebral artery is dominant. Basilar artery is patent, with normal flow related enhancement of the main branch vessels. Normal flow related enhancement of the posterior cerebral arteries.  No large vessel occlusion, hemodynamically significant stenosis, abnormal luminal irregularity, aneurysm within the anterior nor posterior circulation.  MRI CERVICAL SPINE:  Study is limited by motion  artifact.  There is gentle reversal of the normal cervical lordosis, likely related to patient positioning. Chiari 1 malformation noted, better evaluated on concomitant MRI of the brain. T2 hyperintense intra medullary lesion within the cervical spinal cord extending from the level of C4 inferiorly through the T1-2 intervertebral disc space is compatible with an associated syrinx. This measures approximately in 1 cm in greatest transaxial diameter at the level of C6-7. No definite associated mass lesion. Otherwise, signal intensity within the cervical spinal cord is normal.  Signal intensity within the vertebral body bone marrow is within normal limits.  No significant degenerative disc bulge or disc protrusion identified within the cervical spine. No canal or neural foraminal stenosis.  Visualized paraspinous soft tissues are within normal limits.  IMPRESSION: MRI BRAIN:  1. No acute intracranial abnormality identified. 2. 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  MRA BRAIN:  1. Limited study due to motion. 2. No proximal branch occlusion or definite high-grade flow-limiting stenosis within the intracranial circulation.  MRI CERVICAL SPINE:  1. T2 hyperintense intra medullary cystic lesion extending from the C4 through T1-2 level as above. 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 Further imaging with post-contrast MRI of the cervical spine to evaluate for possible enhancement may be helpful for further evaluation. 2. No significant degenerative disc disease within the cervical spine. No canal or neural foraminal stenosis. Results were called by telephone at the time of interpretation on 01/30/2014 at 1:26 AM to Dr. Alexis Goodell, who verbally acknowledged these results.   Electronically Signed   By: Jeannine Boga M.D.   On:  01/30/2014 01:19   CSF Glc 132 Prot  111 RBC 3 WBC 2  Assessment/Plan: Transverse Myelitis Uncontrolled DM Diabetic foot infections, ulcers Thrush  Total days of antibiotics: 2 Aztreonam/Vanco/Levaquin/Acyclovir/Fluconazole  RF, RPR, HIV all negative so far.  Await further results.  His CSF does not appear indicative of infection (low WBC).  Would check HSV and VZV on CSF.  Agree with steroids.          [facility] (pager) [phone] [email or web address] 01/31/2014, 9:26 AM  LOS: 2 days   **Disclaimer: This note may have been dictated with voice recognition software. Similar sounding words can inadvertently be transcribed and this note may contain transcription errors which may not have been corrected upon publication of note.**

## 2014-01-31 NOTE — Progress Notes (Signed)
NEURO HOSPITALIST PROGRESS NOTE   SUBJECTIVE:                                                                                                                        Alert, follows commands and engages in a normal conversation. Stated that he is getting some feeling back in his legs, had muscle cramps/spasms last night, but still can not move his legs. CSF WBC 2, RBC 3, PROTEIN 111, glucose 132. Gram stain: no organisms seen. C-reactive protein >26; Sed rate 102. WBC>33,000 Received first dose IV solumedrol 1 gram earlier today. On Aztreonam/Vanco/Levaquin/Acyclovir/Fluconazole ( day # 2)   OBJECTIVE:                                                                                                                           Vital signs in last 24 hours: Temp:  [98.3 F (36.8 C)-100.5 F (38.1 C)] 100.5 F (38.1 C) (04/26 0405) Pulse Rate:  [87-100] 87 (04/26 1100) Resp:  [14-24] 14 (04/26 1100) BP: (109-143)/(69-86) 121/74 mmHg (04/26 1100) SpO2:  [95 %-100 %] 100 % (04/26 1100) Weight:  [64.2 kg (141 lb 8.6 oz)] 64.2 kg (141 lb 8.6 oz) (04/26 0400)  Intake/Output from previous day: 04/25 0701 - 04/26 0700 In: 2585.6 [I.V.:1755; IV Piggyback:830.6] Out: 1505 [Urine:1505] Intake/Output this shift: Total I/O In: 766.8 [P.O.:350; I.V.:150; IV Piggyback:266.8] Out: 240 [Urine:240] Nutritional status: Clear Liquid  Past Medical History  Diagnosis Date  . Diabetes mellitus   . GERD (gastroesophageal reflux disease)   . Asthma   . Hx MRSA infection     on face  . Gastroparesis   . Diabetic neuropathy   . Seizures     Neurologic Exam:  Mental Status:  Alert, awake, oriented x 4. Comprehension, naming, and repetition intact. Does follow complex commands without difficulty  Cranial Nerves:  II: Discs flat bilaterally; pupils equal, round, reactive to light and accommodation, visual fields intact.  III,IV, VI: ptosis not present,  extra-ocular motions intact bilaterally  V,VII: grimace symmetric  VIII: hearing unable to be tested  IX,X: gag reflex present  XI: bilateral shoulder shrug  XII: tongue extension not able to be tested  Motor:  Moves upper extremities spontaneously and has normal tone  in the upper extremities, but seems to have weakness triceps and wrist extensors bilaterally  Left Lower extremity 0/5 Right Lower extremity 0/5  Flaccid in the lower extremities  Sensory: Appreciates pain in all extremities but unable to determine a sensory level.  Deep Tendon Reflexes: 2+ in the upper extremities and absent in the lower extremities  Plantars:  Unable to test due to sores.  Cerebellar:  Unable to test  Gait: Unable to test   Lab Results: No results found for this basename: cbc, bmp, coags, chol, tri, ldl, hga1c   Lipid Panel No results found for this basename: CHOL, TRIG, HDL, CHOLHDL, VLDL, LDLCALC,  in the last 72 hours  Studies/Results: Mr Brain Wo Contrast  01/30/2014   CLINICAL DATA:  New onset flaccid paralysis.  EXAM: MRI HEAD WITHOUT CONTRAST  MRA HEAD WITHOUT CONTRAST  MRI CERVICAL SPINE WITHOUT CONTRAST  TECHNIQUE: Multiplanar, multiecho pulse sequences of the brain and surrounding structures were obtained without intravenous contrast. Angiographic images of the head were obtained using MRA technique without contrast.  COMPARISON:  None.  FINDINGS: MRI HEAD FINDINGS:  Study is degraded by motion artifact.  The CSF containing spaces are within normal limits for patient age. A few scattered T2/FLAIR hyperintense foci seen within the periventricular and deep white matter of both cerebral hemispheres, nonspecific, and of doubtful clinical significance. No mass lesion, midline shift, or extra-axial fluid collection. Ventricles are normal in size without evidence of hydrocephalus.  No diffusion-weighted signal abnormality is identified to suggest acute intracranial infarct. Gray-white matter  differentiation is maintained. Normal flow voids are seen within the intracranial vasculature. No intracranial hemorrhage identified.  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 There compensatory mild beaking of the fourth ventricle.  Pituitary gland is within normal limits. Pituitary stalk is midline. The globes and optic nerves demonstrate a normal appearance with normal signal intensity. The  The bone marrow signal intensity is normal. Calvarium is intact. Visualized upper cervical spine is within normal limits.  Scalp soft tissues are unremarkable.  Paranasal sinuses are clear.  No mastoid effusion.  MRA HEAD FINDINGS:  Study is markedly limited due to motion artifact.  Anterior circulation: Normal flow related enhancement of the included cervical, petrous, cavernous and supra clinoid internal carotid arteries. Patent anterior communicating artery. Normal flow related enhancement of the anterior and middle cerebral arteries, including more distal segments.  Posterior circulation: Left vertebral artery is dominant. Basilar artery is patent, with normal flow related enhancement of the main branch vessels. Normal flow related enhancement of the posterior cerebral arteries.  No large vessel occlusion, hemodynamically significant stenosis, abnormal luminal irregularity, aneurysm within the anterior nor posterior circulation.  MRI CERVICAL SPINE:  Study is limited by motion artifact.  There is gentle reversal of the normal cervical lordosis, likely related to patient positioning. Chiari 1 malformation noted, better evaluated on concomitant MRI of the brain. T2 hyperintense intra medullary lesion within the cervical spinal cord extending from the level of C4 inferiorly through the T1-2 intervertebral disc space is compatible with an associated syrinx. This measures approximately in 1 cm in greatest transaxial diameter at the level  of C6-7. No definite associated mass lesion. Otherwise, signal intensity within the cervical spinal cord is normal.  Signal intensity within the vertebral body bone marrow is within normal limits.  No significant degenerative disc bulge or disc protrusion identified within the cervical spine. No canal or neural foraminal stenosis.  Visualized  paraspinous soft tissues are within normal limits.  IMPRESSION: MRI BRAIN:  1. No acute intracranial abnormality identified. 2. 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  MRA BRAIN:  1. Limited study due to motion. 2. No proximal branch occlusion or definite high-grade flow-limiting stenosis within the intracranial circulation.  MRI CERVICAL SPINE:  1. T2 hyperintense intra medullary cystic lesion extending from the C4 through T1-2 level as above. 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 Further imaging with post-contrast MRI of the cervical spine to evaluate for possible enhancement may be helpful for further evaluation. 2. No significant degenerative disc disease within the cervical spine. No canal or neural foraminal stenosis. Results were called by telephone at the time of interpretation on 01/30/2014 at 1:26 AM to Dr. Alexis Goodell, who verbally acknowledged these results.   Electronically Signed   By: Jeannine Boga M.D.   On: 01/30/2014 01:19   Mr Cervical Spine Wo Contrast  01/30/2014   CLINICAL DATA:  New onset flaccid paralysis.  EXAM: MRI HEAD WITHOUT CONTRAST  MRA HEAD WITHOUT CONTRAST  MRI CERVICAL SPINE WITHOUT CONTRAST  TECHNIQUE: Multiplanar, multiecho pulse sequences of the brain and surrounding structures were obtained without intravenous contrast. Angiographic images of the head were obtained using MRA technique without contrast.  COMPARISON:  None.  FINDINGS: MRI  HEAD FINDINGS:  Study is degraded by motion artifact.  The CSF containing spaces are within normal limits for patient age. A few scattered T2/FLAIR hyperintense foci seen within the periventricular and deep white matter of both cerebral hemispheres, nonspecific, and of doubtful clinical significance. No mass lesion, midline shift, or extra-axial fluid collection. Ventricles are normal in size without evidence of hydrocephalus.  No diffusion-weighted signal abnormality is identified to suggest acute intracranial infarct. Gray-white matter differentiation is maintained. Normal flow voids are seen within the intracranial vasculature. No intracranial hemorrhage identified.  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 There compensatory mild beaking of the fourth ventricle.  Pituitary gland is within normal limits. Pituitary stalk is midline. The globes and optic nerves demonstrate a normal appearance with normal signal intensity. The  The bone marrow signal intensity is normal. Calvarium is intact. Visualized upper cervical spine is within normal limits.  Scalp soft tissues are unremarkable.  Paranasal sinuses are clear.  No mastoid effusion.  MRA HEAD FINDINGS:  Study is markedly limited due to motion artifact.  Anterior circulation: Normal flow related enhancement of the included cervical, petrous, cavernous and supra clinoid internal carotid arteries. Patent anterior communicating artery. Normal flow related enhancement of the anterior and middle cerebral arteries, including more distal segments.  Posterior circulation: Left vertebral artery is dominant. Basilar artery is patent, with normal flow related enhancement of the main branch vessels. Normal flow related enhancement of the posterior cerebral arteries.  No large vessel occlusion, hemodynamically significant stenosis, abnormal luminal irregularity, aneurysm within the  anterior nor posterior circulation.  MRI CERVICAL SPINE:  Study is limited by motion artifact.  There is gentle reversal of the normal cervical lordosis, likely related to patient positioning. Chiari 1 malformation noted, better evaluated on concomitant MRI of the brain. T2 hyperintense intra medullary lesion within the cervical spinal cord extending from the level of C4 inferiorly through the T1-2 intervertebral disc space is compatible with an associated syrinx. This  measures approximately in 1 cm in greatest transaxial diameter at the level of C6-7. No definite associated mass lesion. Otherwise, signal intensity within the cervical spinal cord is normal.  Signal intensity within the vertebral body bone marrow is within normal limits.  No significant degenerative disc bulge or disc protrusion identified within the cervical spine. No canal or neural foraminal stenosis.  Visualized paraspinous soft tissues are within normal limits.  IMPRESSION: MRI BRAIN:  1. No acute intracranial abnormality identified. 2. 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  MRA BRAIN:  1. Limited study due to motion. 2. No proximal branch occlusion or definite high-grade flow-limiting stenosis within the intracranial circulation.  MRI CERVICAL SPINE:  1. T2 hyperintense intra medullary cystic lesion extending from the C4 through T1-2 level as above. 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 Further imaging with post-contrast MRI of the cervical spine to evaluate for possible enhancement may be helpful for further evaluation. 2. No significant degenerative disc disease within the cervical spine. No canal or neural foraminal stenosis. Results were called by telephone at the time of interpretation on 01/30/2014 at 1:26 AM to Dr. Alexis Goodell, who verbally  acknowledged these results.   Electronically Signed   By: Jeannine Boga M.D.   On: 01/30/2014 01:19   Dg Abd Portable 1v  01/29/2014   CLINICAL DATA:  Abdominal pain, hyperglycemia, history diabetes, gastroparesis  EXAM: PORTABLE ABDOMEN - 1 VIEW  COMPARISON:  Portable exam 2059 hr compared to 09/28/2013  FINDINGS: Slight motion artifact limits exam.  Dilated small bowel loop in the mid abdomen, nonspecific.  Remaining bowel loops normal.  No definite bowel wall thickening or urinary tract calcification.  Osseous structures unremarkable.  Multiple left pelvic phleboliths.  IMPRESSION: Single mildly distended small bowel loop in the left mid abdomen, nonspecific.  No other abnormalities identified.   Electronically Signed   By: Lavonia Dana M.D.   On: 01/29/2014 21:14   Dg Toe Great Left  01/29/2014   CLINICAL DATA:  Diabetic patient.  Skin ulceration.  EXAM: LEFT GREAT TOE  COMPARISON:  None.  FINDINGS: Soft tissues of the great toe appear swollen. No bony destructive change or soft tissue gas collection is identified. There is no fracture or dislocation.  IMPRESSION: Soft tissue swelling without plain film evidence of osteomyelitis or other acute bony abnormality.   Electronically Signed   By: Inge Rise M.D.   On: 01/29/2014 13:40   Dg Toe Great Right  01/29/2014   CLINICAL DATA:  Hyperglycemia  EXAM: RIGHT GREAT TOE  COMPARISON:  MRI 4 foot 01/29/2013 ; prior radiographs of the right foot 01/28/2013  FINDINGS: Tissue irregularity of the medial aspect of the great toe at the interphalangeal joint concerning for cellulitis or ulceration. Soft tissue swelling and bulging of the nail bed. No conventional radiographic evidence of osteomyelitis. No acute fracture or malalignment. Atherosclerotic calcifications noted in the visualized small vessels.  IMPRESSION: 1. Diffuse soft tissue swelling about the toe concerning for cellulitis. 2. No evidence of acute osseous abnormality.   Electronically  Signed   By: Jacqulynn Cadet M.D.   On: 01/29/2014 13:41   Mr Jodene Nam Head/brain Wo Cm  01/30/2014   CLINICAL DATA:  New onset flaccid paralysis.  EXAM: MRI HEAD WITHOUT CONTRAST  MRA HEAD WITHOUT CONTRAST  MRI CERVICAL SPINE WITHOUT CONTRAST  TECHNIQUE:  Multiplanar, multiecho pulse sequences of the brain and surrounding structures were obtained without intravenous contrast. Angiographic images of the head were obtained using MRA technique without contrast.  COMPARISON:  None.  FINDINGS: MRI HEAD FINDINGS:  Study is degraded by motion artifact.  The CSF containing spaces are within normal limits for patient age. A few scattered T2/FLAIR hyperintense foci seen within the periventricular and deep white matter of both cerebral hemispheres, nonspecific, and of doubtful clinical significance. No mass lesion, midline shift, or extra-axial fluid collection. Ventricles are normal in size without evidence of hydrocephalus.  No diffusion-weighted signal abnormality is identified to suggest acute intracranial infarct. Gray-white matter differentiation is maintained. Normal flow voids are seen within the intracranial vasculature. No intracranial hemorrhage identified.  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 There compensatory mild beaking of the fourth ventricle.  Pituitary gland is within normal limits. Pituitary stalk is midline. The globes and optic nerves demonstrate a normal appearance with normal signal intensity. The  The bone marrow signal intensity is normal. Calvarium is intact. Visualized upper cervical spine is within normal limits.  Scalp soft tissues are unremarkable.  Paranasal sinuses are clear.  No mastoid effusion.  MRA HEAD FINDINGS:  Study is markedly limited due to motion artifact.  Anterior circulation: Normal flow related enhancement of the included cervical, petrous, cavernous and supra clinoid internal carotid  arteries. Patent anterior communicating artery. Normal flow related enhancement of the anterior and middle cerebral arteries, including more distal segments.  Posterior circulation: Left vertebral artery is dominant. Basilar artery is patent, with normal flow related enhancement of the main branch vessels. Normal flow related enhancement of the posterior cerebral arteries.  No large vessel occlusion, hemodynamically significant stenosis, abnormal luminal irregularity, aneurysm within the anterior nor posterior circulation.  MRI CERVICAL SPINE:  Study is limited by motion artifact.  There is gentle reversal of the normal cervical lordosis, likely related to patient positioning. Chiari 1 malformation noted, better evaluated on concomitant MRI of the brain. T2 hyperintense intra medullary lesion within the cervical spinal cord extending from the level of C4 inferiorly through the T1-2 intervertebral disc space is compatible with an associated syrinx. This measures approximately in 1 cm in greatest transaxial diameter at the level of C6-7. No definite associated mass lesion. Otherwise, signal intensity within the cervical spinal cord is normal.  Signal intensity within the vertebral body bone marrow is within normal limits.  No significant degenerative disc bulge or disc protrusion identified within the cervical spine. No canal or neural foraminal stenosis.  Visualized paraspinous soft tissues are within normal limits.  IMPRESSION: MRI BRAIN:  1. No acute intracranial abnormality identified. 2. 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  MRA BRAIN:  1. Limited study due to motion. 2. No proximal branch occlusion or definite high-grade flow-limiting stenosis within the intracranial circulation.  MRI CERVICAL SPINE:  1. T2 hyperintense intra medullary cystic lesion extending from the C4 through T1-2 level as above. Finding may reflect a syrinx associated with Chiari 1  malformation. Given the history of new onset flaccid paralysis, possible transverse myelitis could also be considered. Further imaging with post-contrast MRI of the cervical spine to evaluate for possible enhancement may be helpful for further evaluation. 2. No significant degenerative disc disease within the cervical spine. No canal or neural foraminal stenosis. Results were called by telephone at the time of interpretation on 01/30/2014 at  1:26 AM to Dr. Alexis Goodell, who verbally acknowledged these results.   Electronically Signed   By: Jeannine Boga M.D.   On: 01/30/2014 01:19    MEDICATIONS                                                                                                                       I have reviewed the patient's current medications.  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 CSF is not consistent with infection. Has bilateral LE cellulitis. Except for elevated proteins, the CSF is unimpressive so far. Plan to continue high dose IV methylprednisolone and repeat MRI with and without contrast in 24-48 hours. Will follow up.  Dorian Pod, MD [facility] [phone]  01/31/2014, 11:47 AM

## 2014-01-31 NOTE — Progress Notes (Signed)
[facility] Progress Note Patient Name: Jason [name] DOB: 1985-11-30 MRN: [id number]  Date of Service  01/31/2014   HPI/Events of Note   Ongoing pain issues.  Patient with chronic pain now worse.  eICU Interventions  Fentanyl stopped. Transitioned to oral regimen similar to outpatient regimen      Melvia Heaps 01/31/2014, 4:15 PM

## 2014-01-31 NOTE — Progress Notes (Signed)
PULMONARY / CRITICAL CARE MEDICINE   Name: Jason [name] MRN: [id number] DOB: 03-Jun-1986    ADMISSION DATE:  01/29/2014 CONSULTATION DATE:  4/25  REFERRING MD :  Clementeen Graham  PRIMARY SERVICE: [facility]>>>PCCM   CHIEF COMPLAINT:  Sepsis/ MS change   BRIEF PATIENT DESCRIPTION:  28 year old male w/ sig h/o DM, DM related neuropathy, diabetic ulcers (which had been worse over the week prior to admit), and gastroparesis,  admitted on 4/24 w/ CC: leg weakness. He was admitted w/ working dx of bilateral LE cellulitis, possible PNA, bilateral leg weakness, Acute on chronic renal injury and hyperglycemia. MRI of the brain/ C spine obtained showed Chiari I and some cord enlargement that may be a syrinx vs a TM. Treatment to date had included IVFs, Insulin gtt which was c/b hypoglycemia, and empiric abx. PCCM was consulted on 4/25 given concern that the worsening lethargy and progressive upper extremity weakness.    SIGNIFICANT EVENTS / STUDIES:  MRI brain 4/24 1. No acute intracranial abnormality identified. 2. 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 MRA BRAIN 4/24: 1. Limited study due to motion. 2. No proximal branch occlusion or definite high-grade flow-limiting stenosis within the intracranial circulation. MRI C spine 4/24: 1. T2 hyperintense intra medullary cystic lesion extending from the C4 through T1-2 level as above. 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 Given the history of new onset flaccid paralysis, possible transverse myelitis could also be considered HIV RNA 4/25>>> Lyme 4/25>>> RPR 4/25>>>NR ANA 4/25>>> ANCA 4/25>>> RF 4/25>>> ESR 4/25>>>102 CRP 4/25>>>26.1 LP 4/25: CSF cell ct 4/25: clear; WBC: 2; RBC 3; CSF protein: 111; glucose: 111 Steroid trial initiated 4/26>>>  LINES / TUBES:   CULTURES: BCX2 4/24>>> UC 4/24>>> CSF GM stain 4/25: neg  CSF 4/25>>>  ANTIBIOTICS: vanc 4/24>>> azactam  4/24>>> levaquin 4/25>>> Acyclovir 4/25>>> fluc 4/25>>>   SUBJECTIVE:  Feels like upper extremities and chest is "weak". Not in distress.   VITAL SIGNS: Temp:  [98.3 F (36.8 C)-100.5 F (38.1 C)] 100.5 F (38.1 C) (04/26 0405) Pulse Rate:  [89-102] 89 (04/26 0700) Resp:  [16-31] 24 (04/26 0700) BP: (109-143)/(69-86) 117/72 mmHg (04/26 0700) SpO2:  [95 %-100 %] 100 % (04/26 0700) Weight:  [64.2 kg (141 lb 8.6 oz)] 64.2 kg (141 lb 8.6 oz) (04/26 0400) HEMODYNAMICS:   VENTILATOR SETTINGS:   INTAKE / OUTPUT: Intake/Output     04/25 0701 - 04/26 0700 04/26 0701 - 04/27 0700   I.V. (mL/kg) 1755 (27.3)    IV Piggyback 830.6    Total Intake(mL/kg) 2585.6 (40.3)    Urine (mL/kg/hr) 1505 (1)    Total Output 1505     Net +1080.6            PHYSICAL EXAMINATION: General:  Chronically ill appearing 28 year male looks much older than stated age Neuro:  Can't move LEs, progressive upper extremity gross motor str only, not changed since 4/25 HEENT:  Poor dentition  Cardiovascular:  rrr Lungs:  Crackles RLL  Abdomen:  Soft, non-tender + bowel sounds  Musculoskeletal:  Unable to move UEs, gross motor uppers only.  Skin:  LE ulcer dressings intact   LABS:  CBC  Recent Labs Lab 01/29/14 0858 01/30/14 0428 01/31/14 0250  WBC 32.0* 33.6* 33.5*  HGB 9.4* 8.4* 8.0*  HCT 28.1* 25.6* 24.7*  PLT 462* 434* 447*   Coag's  Recent Labs Lab 01/29/14 2113  INR 1.12  BMET  Recent Labs Lab 01/29/14 2113 01/30/14 0428 01/31/14 0250  NA 138 138 138  K 3.9 4.2 3.8  CL 103 104 103  CO2 _0 BUN _1 CREATININE 0.98 0.94 0.79  GLUCOSE 136* 227* 163*   Electrolytes  Recent Labs Lab 01/29/14 2113 01/30/14 0428 01/31/14 0250  CALCIUM 8.4 8.5 8.2*  MG 1.7  --   --   PHOS 3.1  --   --    Sepsis Markers  Recent Labs Lab 01/29/14 0918  LATICACIDVEN 2.12   ABG  Recent Labs Lab 01/29/14 2350 01/30/14 0829  PHART 7.417 7.426  PCO2ART 40.2 38.9   PO2ART 52.6* 96.3   Liver Enzymes  Recent Labs Lab 01/29/14 0858 01/29/14 2113 01/30/14 0428  AST _2 ALT _3 ALKPHOS 179* 190* 171*  BILITOT 0.2* 0.3 0.3  ALBUMIN 1.8* 1.5* 1.5*   Cardiac Enzymes  Recent Labs Lab 01/29/14 2113  PROBNP 1343.0*   Glucose  Recent Labs Lab 01/30/14 0412 01/30/14 0608 01/30/14 1140 01/30/14 1238 01/30/14 1537 01/31/14 0407  GLUCAP 223* 253* 211* 207* 238* 183*    Imaging Ct Head Wo Contrast  01/29/2014   CLINICAL DATA:  Hyperglycemia.  Diabetes.  EXAM: CT HEAD WITHOUT CONTRAST  TECHNIQUE: Contiguous axial images were obtained from the base of the skull through the vertex without intravenous contrast.  COMPARISON:  None.  FINDINGS: Image quality degraded by mild motion.  Ventricle size is normal. Small hypodensity left frontal white matter, likely due to chronic ischemia. Negative for acute infarct, hemorrhage, or mass lesion. Calvarium intact. Visualized sinuses are clear.  IMPRESSION: Small hypodensity left frontal white matter, most consistent with mild chronic microvascular ischemia. No acute abnormality.   Electronically Signed   By: Franchot Gallo M.D.   On: 01/29/2014 11:16   Ct Lumbar Spine Wo Contrast  01/29/2014   CLINICAL DATA:  Chronic neuropathy. Diabetes. Bilateral leg numbness. Hyperglycemia.  EXAM: CT LUMBAR SPINE WITHOUT CONTRAST  TECHNIQUE: Multidetector CT imaging of the lumbar spine was performed without intravenous contrast administration. Multiplanar CT image reconstructions were also generated.  COMPARISON:  None.  FINDINGS: Negative for fracture or mass. Disc spaces are normal. No degenerative change. Facet joints are normal.  No soft tissue swelling. The psoas muscle and retroperitoneum are normal.  No evidence of spinal infection. MRI with contrast is more sensitive than CT for detection of spinal infection.  IMPRESSION: Normal   Electronically Signed   By: Franchot Gallo M.D.   On: 01/29/2014 11:28   Mr Brain  Wo Contrast  01/30/2014   CLINICAL DATA:  New onset flaccid paralysis.  EXAM: MRI HEAD WITHOUT CONTRAST  MRA HEAD WITHOUT CONTRAST  MRI CERVICAL SPINE WITHOUT CONTRAST  TECHNIQUE: Multiplanar, multiecho pulse sequences of the brain and surrounding structures were obtained without intravenous contrast. Angiographic images of the head were obtained using MRA technique without contrast.  COMPARISON:  None.  FINDINGS: MRI HEAD FINDINGS:  Study is degraded by motion artifact.  The CSF containing spaces are within normal limits for patient age. A few scattered T2/FLAIR hyperintense foci seen within the periventricular and deep white matter of both cerebral hemispheres, nonspecific, and of doubtful clinical significance. No mass lesion, midline shift, or extra-axial fluid collection. Ventricles are normal in size without evidence of hydrocephalus.  No diffusion-weighted signal abnormality is identified to suggest acute intracranial infarct. Gray-white matter differentiation is maintained. Normal flow voids are seen within the intracranial vasculature. No  intracranial hemorrhage identified.  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 There compensatory mild beaking of the fourth ventricle.  Pituitary gland is within normal limits. Pituitary stalk is midline. The globes and optic nerves demonstrate a normal appearance with normal signal intensity. The  The bone marrow signal intensity is normal. Calvarium is intact. Visualized upper cervical spine is within normal limits.  Scalp soft tissues are unremarkable.  Paranasal sinuses are clear.  No mastoid effusion.  MRA HEAD FINDINGS:  Study is markedly limited due to motion artifact.  Anterior circulation: Normal flow related enhancement of the included cervical, petrous, cavernous and supra clinoid internal carotid arteries. Patent anterior communicating artery. Normal flow related  enhancement of the anterior and middle cerebral arteries, including more distal segments.  Posterior circulation: Left vertebral artery is dominant. Basilar artery is patent, with normal flow related enhancement of the main branch vessels. Normal flow related enhancement of the posterior cerebral arteries.  No large vessel occlusion, hemodynamically significant stenosis, abnormal luminal irregularity, aneurysm within the anterior nor posterior circulation.  MRI CERVICAL SPINE:  Study is limited by motion artifact.  There is gentle reversal of the normal cervical lordosis, likely related to patient positioning. Chiari 1 malformation noted, better evaluated on concomitant MRI of the brain. T2 hyperintense intra medullary lesion within the cervical spinal cord extending from the level of C4 inferiorly through the T1-2 intervertebral disc space is compatible with an associated syrinx. This measures approximately in 1 cm in greatest transaxial diameter at the level of C6-7. No definite associated mass lesion. Otherwise, signal intensity within the cervical spinal cord is normal.  Signal intensity within the vertebral body bone marrow is within normal limits.  No significant degenerative disc bulge or disc protrusion identified within the cervical spine. No canal or neural foraminal stenosis.  Visualized paraspinous soft tissues are within normal limits.  IMPRESSION: MRI BRAIN:  1. No acute intracranial abnormality identified. 2. 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  MRA BRAIN:  1. Limited study due to motion. 2. No proximal branch occlusion or definite high-grade flow-limiting stenosis within the intracranial circulation.  MRI CERVICAL SPINE:  1. T2 hyperintense intra medullary cystic lesion extending from the C4 through T1-2 level as above. 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 Given the history of new onset flaccid paralysis,  possible transverse myelitis could also be considered. Further imaging with post-contrast MRI of the cervical spine to evaluate for possible enhancement may be helpful for further evaluation. 2. No significant degenerative disc disease within the cervical spine. No canal or neural foraminal stenosis. Results were called by telephone at the time of interpretation on 01/30/2014 at 1:26 AM to Dr. Alexis Goodell, who verbally acknowledged these results.   Electronically Signed   By: Jeannine Boga M.D.   On: 01/30/2014 01:19   Mr Cervical Spine Wo Contrast  01/30/2014   CLINICAL DATA:  New onset flaccid paralysis.  EXAM: MRI HEAD WITHOUT CONTRAST  MRA HEAD WITHOUT CONTRAST  MRI CERVICAL SPINE WITHOUT CONTRAST  TECHNIQUE: Multiplanar, multiecho pulse sequences of the brain and surrounding structures were obtained without intravenous contrast. Angiographic images of the head were obtained using MRA technique without contrast.  COMPARISON:  None.  FINDINGS: MRI HEAD FINDINGS:  Study is degraded by motion artifact.  The CSF containing spaces are within normal limits for patient age. A few scattered T2/FLAIR hyperintense  foci seen within the periventricular and deep white matter of both cerebral hemispheres, nonspecific, and of doubtful clinical significance. No mass lesion, midline shift, or extra-axial fluid collection. Ventricles are normal in size without evidence of hydrocephalus.  No diffusion-weighted signal abnormality is identified to suggest acute intracranial infarct. Gray-white matter differentiation is maintained. Normal flow voids are seen within the intracranial vasculature. No intracranial hemorrhage identified.  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 There compensatory mild beaking of the fourth ventricle.  Pituitary gland is within normal limits. Pituitary stalk is midline. The globes and optic nerves  demonstrate a normal appearance with normal signal intensity. The  The bone marrow signal intensity is normal. Calvarium is intact. Visualized upper cervical spine is within normal limits.  Scalp soft tissues are unremarkable.  Paranasal sinuses are clear.  No mastoid effusion.  MRA HEAD FINDINGS:  Study is markedly limited due to motion artifact.  Anterior circulation: Normal flow related enhancement of the included cervical, petrous, cavernous and supra clinoid internal carotid arteries. Patent anterior communicating artery. Normal flow related enhancement of the anterior and middle cerebral arteries, including more distal segments.  Posterior circulation: Left vertebral artery is dominant. Basilar artery is patent, with normal flow related enhancement of the main branch vessels. Normal flow related enhancement of the posterior cerebral arteries.  No large vessel occlusion, hemodynamically significant stenosis, abnormal luminal irregularity, aneurysm within the anterior nor posterior circulation.  MRI CERVICAL SPINE:  Study is limited by motion artifact.  There is gentle reversal of the normal cervical lordosis, likely related to patient positioning. Chiari 1 malformation noted, better evaluated on concomitant MRI of the brain. T2 hyperintense intra medullary lesion within the cervical spinal cord extending from the level of C4 inferiorly through the T1-2 intervertebral disc space is compatible with an associated syrinx. This measures approximately in 1 cm in greatest transaxial diameter at the level of C6-7. No definite associated mass lesion. Otherwise, signal intensity within the cervical spinal cord is normal.  Signal intensity within the vertebral body bone marrow is within normal limits.  No significant degenerative disc bulge or disc protrusion identified within the cervical spine. No canal or neural foraminal stenosis.  Visualized paraspinous soft tissues are within normal limits.  IMPRESSION: MRI BRAIN:   1. No acute intracranial abnormality identified. 2. 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  MRA BRAIN:  1. Limited study due to motion. 2. No proximal branch occlusion or definite high-grade flow-limiting stenosis within the intracranial circulation.  MRI CERVICAL SPINE:  1. T2 hyperintense intra medullary cystic lesion extending from the C4 through T1-2 level as above. 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 Further imaging with post-contrast MRI of the cervical spine to evaluate for possible enhancement may be helpful for further evaluation. 2. No significant degenerative disc disease within the cervical spine. No canal or neural foraminal stenosis. Results were called by telephone at the time of interpretation on 01/30/2014 at 1:26 AM to Dr. Alexis Goodell, who verbally acknowledged these results.   Electronically Signed   By: Jeannine Boga M.D.   On: 01/30/2014 01:19   Dg Chest Port 1 View  01/29/2014   CLINICAL DATA:  Lethargy, fatigue  EXAM: PORTABLE CHEST - 1 VIEW  COMPARISON:  10/02/2013  FINDINGS: Cardiac shadow is stable.  The lungs are well aerated bilaterally. Mild patchy infiltrative density is noted in the right mid and upper lung. No acute bony abnormality is noted. Chronic changes of the distal left clavicle are again seen.  IMPRESSION: Patchy infiltrate in the right mid and upper lung. Followup films are recommended.   Electronically Signed   By: Inez Catalina M.D.   On: 01/29/2014 09:16   Dg Abd Portable 1v  01/29/2014   CLINICAL DATA:  Abdominal pain, hyperglycemia, history diabetes, gastroparesis  EXAM: PORTABLE ABDOMEN - 1 VIEW  COMPARISON:  Portable exam 2059 hr compared to 09/28/2013  FINDINGS: Slight motion artifact limits exam.  Dilated small bowel loop in the mid abdomen, nonspecific.   Remaining bowel loops normal.  No definite bowel wall thickening or urinary tract calcification.  Osseous structures unremarkable.  Multiple left pelvic phleboliths.  IMPRESSION: Single mildly distended small bowel loop in the left mid abdomen, nonspecific.  No other abnormalities identified.   Electronically Signed   By: Lavonia Dana M.D.   On: 01/29/2014 21:14   Dg Toe Great Left  01/29/2014   CLINICAL DATA:  Diabetic patient.  Skin ulceration.  EXAM: LEFT GREAT TOE  COMPARISON:  None.  FINDINGS: Soft tissues of the great toe appear swollen. No bony destructive change or soft tissue gas collection is identified. There is no fracture or dislocation.  IMPRESSION: Soft tissue swelling without plain film evidence of osteomyelitis or other acute bony abnormality.   Electronically Signed   By: Inge Rise M.D.   On: 01/29/2014 13:40   Dg Toe Great Right  01/29/2014   CLINICAL DATA:  Hyperglycemia  EXAM: RIGHT GREAT TOE  COMPARISON:  MRI 4 foot 01/29/2013 ; prior radiographs of the right foot 01/28/2013  FINDINGS: Tissue irregularity of the medial aspect of the great toe at the interphalangeal joint concerning for cellulitis or ulceration. Soft tissue swelling and bulging of the nail bed. No conventional radiographic evidence of osteomyelitis. No acute fracture or malalignment. Atherosclerotic calcifications noted in the visualized small vessels.  IMPRESSION: 1. Diffuse soft tissue swelling about the toe concerning for cellulitis. 2. No evidence of acute osseous abnormality.   Electronically Signed   By: Jacqulynn Cadet M.D.   On: 01/29/2014 13:41   Mr Jodene Nam Head/brain Wo Cm  01/30/2014   CLINICAL DATA:  New onset flaccid paralysis.  EXAM: MRI HEAD WITHOUT CONTRAST  MRA HEAD WITHOUT CONTRAST  MRI CERVICAL SPINE WITHOUT CONTRAST  TECHNIQUE: Multiplanar, multiecho pulse sequences of the brain and surrounding structures were obtained without intravenous contrast. Angiographic images of the head were obtained  using MRA technique without contrast.  COMPARISON:  None.  FINDINGS: MRI HEAD FINDINGS:  Study is degraded by motion artifact.  The CSF containing spaces are within normal limits for patient age. A few scattered T2/FLAIR hyperintense foci seen within the periventricular and deep white matter of both cerebral hemispheres, nonspecific, and of doubtful clinical significance. No mass lesion, midline shift, or extra-axial fluid collection. Ventricles are normal in size without evidence of hydrocephalus.  No diffusion-weighted signal abnormality is identified to suggest acute intracranial infarct. Gray-white matter differentiation is maintained. Normal flow voids are seen within the intracranial vasculature. No intracranial hemorrhage identified.  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 There compensatory mild beaking of the fourth ventricle.  Pituitary gland is within normal limits. Pituitary stalk is midline. The globes and optic nerves demonstrate a normal appearance with normal signal intensity.  The  The bone marrow signal intensity is normal. Calvarium is intact. Visualized upper cervical spine is within normal limits.  Scalp soft tissues are unremarkable.  Paranasal sinuses are clear.  No mastoid effusion.  MRA HEAD FINDINGS:  Study is markedly limited due to motion artifact.  Anterior circulation: Normal flow related enhancement of the included cervical, petrous, cavernous and supra clinoid internal carotid arteries. Patent anterior communicating artery. Normal flow related enhancement of the anterior and middle cerebral arteries, including more distal segments.  Posterior circulation: Left vertebral artery is dominant. Basilar artery is patent, with normal flow related enhancement of the main branch vessels. Normal flow related enhancement of the posterior cerebral arteries.  No large vessel occlusion, hemodynamically  significant stenosis, abnormal luminal irregularity, aneurysm within the anterior nor posterior circulation.  MRI CERVICAL SPINE:  Study is limited by motion artifact.  There is gentle reversal of the normal cervical lordosis, likely related to patient positioning. Chiari 1 malformation noted, better evaluated on concomitant MRI of the brain. T2 hyperintense intra medullary lesion within the cervical spinal cord extending from the level of C4 inferiorly through the T1-2 intervertebral disc space is compatible with an associated syrinx. This measures approximately in 1 cm in greatest transaxial diameter at the level of C6-7. No definite associated mass lesion. Otherwise, signal intensity within the cervical spinal cord is normal.  Signal intensity within the vertebral body bone marrow is within normal limits.  No significant degenerative disc bulge or disc protrusion identified within the cervical spine. No canal or neural foraminal stenosis.  Visualized paraspinous soft tissues are within normal limits.  IMPRESSION: MRI BRAIN:  1. No acute intracranial abnormality identified. 2. 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  MRA BRAIN:  1. Limited study due to motion. 2. No proximal branch occlusion or definite high-grade flow-limiting stenosis within the intracranial circulation.  MRI CERVICAL SPINE:  1. T2 hyperintense intra medullary cystic lesion extending from the C4 through T1-2 level as above. 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 Further imaging with post-contrast MRI of the cervical spine to evaluate for possible enhancement may be helpful for further evaluation. 2. No significant degenerative disc disease within the cervical spine. No canal or neural foraminal stenosis. Results were called by telephone at the time of  interpretation on 01/30/2014 at 1:26 AM to Dr. Alexis Goodell, who verbally acknowledged these results.   Electronically Signed   By: Jeannine Boga M.D.   On: 01/30/2014 01:19     CXR:  Mild patchy right sided airspace disease   ASSESSMENT / PLAN:  PULMONARY A: possible aspiration PNA  P:   Reflux precautions.  Nursing bedside eval, if needed will involve SLP Wean Fio2 F/u cxr   CARDIOVASCULAR A: sepsis  P:  Keep even volume status  Tele   RENAL A:   AKI, improved w/ IVFs P:   Keep even to + volume status Renal dose meds   GASTROINTESTINAL A:   Diabetic gastroparesis  GERD P:   PPI Reflux and Aspiration precautions   HEMATOLOGIC A:   Chronic anemia (microcytic/hypochromic) P:  Ck anemia panel  hemeoccult stools Hold heparin no good place to put SCDs will try LEs.    INFECTIOUS A:   SIRS/sepsis Concerned about CSF infection vs transverse myelitis  trush Infected diabetic foot ulcers Possible aspiration   Appreciate  ID comments:  "Multiple infectious agents can cause molecular mimicry leading to transverse myelitis (staph, strep, Varicella zoster virus) as well multiple agents can cause TM (lyme (not endemic to [place]), syphillis, HIV, [place] (not clear that mosquitos are present). There are multiple causes of inflammatory conditions that can cause this as well (SLE, sarcoid). Lastly, would like to know that this is not a progressive structural disease".  >all culture data negative to date. LP negative  P:   Defer abx/antifungal/antiviral to ID F/u culture data  Get WOC consult to address wound care of LEs  ENDOCRINE A:  DM P:   cbg q 4 w/sensitive scale. Anticipate he will need escalated insulin support while on high dose steroids.   NEUROLOGIC A:   Progressive weakness R/o transverse myelitis  Remains weak. Culture data neg. LP not c/w bacterial meningitis  P:   Neuro following Cont IV abx Start steroids 1gm/d x 3d  TODAY'S SUMMARY:   Not worse not better. Cultures neg to date. LP neg. Will start high dose steroids. Escalate glycemic control. Keep ICU status for another 24hrs. Hope we should see some improvement with steroids.  Once out of the ICU back to [facility] in AM.  CC time 35 min.  Patient seen and examined, agree with above note.  I dictated the care and orders written for this patient under my direction.  Rush Farmer, MD [phone]

## 2014-01-31 NOTE — Consult Note (Signed)
WOC wound consult note Reason for Consult: Assessment and suggestions for management of LE ulcerations Wound type:Vasculitic, atypical Pressure Ulcer POA: No Measurement:Right posterior hallux:  1.5cm x 1cm with depth obscured by the presence of white necrotic tissue.  Left posterior hallux:  1cm x 1cm with depth obscured by the presence of white necrotic tissue.  Right pretibial LE: 1cm round x 0.2cm full thickness wound with 90% red wound bed and 10% yellow slough at periphery.  Left pretibial area with two ulcerations, both full thickness.  Proximal:  4.5cm x 4.5cm x 0.2cm and moist pink wound bed with evidence of previous wound healing (redeposition of melanin/pigment into tissues), Distal:  2cm x 2cm x 0.2cm with moist pink wound bed. Wound bed: As described above. Drainage (amount, consistency, odor) moderate amount of serosanguinous exudate on old dressing, that had adhered. Faint odor.  Periwound: Intact, dry.  Hemosiderin staining on pretibial areas of LEs, L>R. Dressing procedure/placement/frequency: I will provide guidance for care of bilateral LE (pretibial) ulcerations that are full thickness and appear to be vasculitic in nature albeit not classic or difinitive.  While there is hemosiderin staining (L>R) and this is typically indicative of venous insufficiency, the ulcerations are more punctate and indicative of arterial insufficiency. Given the patient's age and history, neither etiology is probable.  The goal nonetheless is clear: support with moisture retentive dressings to autolytically debride and enhance reepithelialization. Calcium alginate dressings are applied daily to absorb exudate, prevent traumatic removal and enhance reepithelialization.  The bilateral hallux ulcerations (full thickness, posterior) are similar in size and the depth is obscured by the presence of necrotic tissue. I have provided for enzymatic debridement using collagenase (Santyl) to those two areas. As a pressure  ulcer preventative measure I have provided bilateral pressure redistribution boots (Prevalon) to support the efforts of the nursing staff. [place] nursing team will not follow, but will remain available to this patient, the nursing and medical teams.  Please re-consult if needed. Thanks, Maudie Flakes, MSN, RN, [place], [place], [place] [phone])

## 2014-02-01 DIAGNOSIS — IMO0002 Reserved for concepts with insufficient information to code with codable children: Secondary | ICD-10-CM

## 2014-02-01 DIAGNOSIS — E1142 Type 2 diabetes mellitus with diabetic polyneuropathy: Secondary | ICD-10-CM

## 2014-02-01 DIAGNOSIS — B009 Herpesviral infection, unspecified: Secondary | ICD-10-CM

## 2014-02-01 DIAGNOSIS — E1069 Type 1 diabetes mellitus with other specified complication: Secondary | ICD-10-CM

## 2014-02-01 DIAGNOSIS — E1049 Type 1 diabetes mellitus with other diabetic neurological complication: Secondary | ICD-10-CM

## 2014-02-01 DIAGNOSIS — E1065 Type 1 diabetes mellitus with hyperglycemia: Secondary | ICD-10-CM

## 2014-02-01 LAB — CBC
HCT: 26.5 % — ABNORMAL LOW (ref 39.0–52.0)
Hemoglobin: 8.6 g/dL — ABNORMAL LOW (ref 13.0–17.0)
MCH: 25.2 pg — ABNORMAL LOW (ref 26.0–34.0)
MCHC: 32.5 g/dL (ref 30.0–36.0)
MCV: 77.7 fL — ABNORMAL LOW (ref 78.0–100.0)
PLATELETS: 466 10*3/uL — AB (ref 150–400)
RBC: 3.41 MIL/uL — AB (ref 4.22–5.81)
RDW: 14.6 % (ref 11.5–15.5)
WBC: 38 10*3/uL — AB (ref 4.0–10.5)

## 2014-02-01 LAB — ANCA SCREEN W REFLEX TITER
ATYPICAL P-ANCA SCREEN: NEGATIVE
c-ANCA Screen: NEGATIVE
p-ANCA Screen: NEGATIVE

## 2014-02-01 LAB — GLUCOSE, CAPILLARY
GLUCOSE-CAPILLARY: 171 mg/dL — AB (ref 70–99)
GLUCOSE-CAPILLARY: 210 mg/dL — AB (ref 70–99)
GLUCOSE-CAPILLARY: 211 mg/dL — AB (ref 70–99)
GLUCOSE-CAPILLARY: 225 mg/dL — AB (ref 70–99)
Glucose-Capillary: 186 mg/dL — ABNORMAL HIGH (ref 70–99)
Glucose-Capillary: 217 mg/dL — ABNORMAL HIGH (ref 70–99)
Glucose-Capillary: 182 mg/dL — ABNORMAL HIGH (ref 70–99)
Glucose-Capillary: 194 mg/dL — ABNORMAL HIGH (ref 70–99)
Glucose-Capillary: 271 mg/dL — ABNORMAL HIGH (ref 70–99)

## 2014-02-01 LAB — COMPREHENSIVE METABOLIC PANEL
ALK PHOS: 152 U/L — AB (ref 39–117)
ALT: 5 U/L (ref 0–53)
AST: 7 U/L (ref 0–37)
Albumin: 1.4 g/dL — ABNORMAL LOW (ref 3.5–5.2)
BILIRUBIN TOTAL: 0.3 mg/dL (ref 0.3–1.2)
BUN: 11 mg/dL (ref 6–23)
CHLORIDE: 98 meq/L (ref 96–112)
CO2: 22 mEq/L (ref 19–32)
Calcium: 8.2 mg/dL — ABNORMAL LOW (ref 8.4–10.5)
Creatinine, Ser: 0.78 mg/dL (ref 0.50–1.35)
GFR calc Af Amer: >90 mL/min (ref >90)
GFR calc non Af Amer: >90 mL/min (ref >90)
Glucose, Bld: 246 mg/dL — ABNORMAL HIGH (ref 70–99)
POTASSIUM: 3.8 meq/L (ref 3.7–5.3)
Sodium: 132 mEq/L — ABNORMAL LOW (ref 137–147)
Total Protein: 6.9 g/dL (ref 6.0–8.3)

## 2014-02-01 LAB — HERPES SIMPLEX VIRUS(HSV) DNA BY PCR
HSV 1 DNA: NOT DETECTED
HSV 2 DNA: NOT DETECTED

## 2014-02-01 LAB — HIV-1 RNA QUANT-NO REFLEX-BLD: HIV 1 RNA Quant: <20 copies/mL (ref <20)

## 2014-02-01 LAB — ANA: ANA: NEGATIVE

## 2014-02-01 LAB — B. BURGDORFI ANTIBODIES: B BURGDORFERI AB IGG+ IGM: 0.56 {ISR}

## 2014-02-01 MED ORDER — METOCLOPRAMIDE HCL 10 MG/10ML PO SOLN
10.0000 mg | Freq: Three times a day (TID) | ORAL | Status: DC | PRN
  Filled 2014-02-01: qty 10

## 2014-02-01 MED ORDER — SODIUM CHLORIDE 0.9 % IV SOLN
1000.0000 mg | Freq: Every day | INTRAVENOUS | Status: AC
  Administered 2014-02-02 – 2014-02-03 (×2): 1000 mg via INTRAVENOUS
  Filled 2014-02-01 (×3): qty 8

## 2014-02-01 MED ORDER — INSULIN GLARGINE 100 UNIT/ML ~~LOC~~ SOLN
15.0000 [IU] | Freq: Two times a day (BID) | SUBCUTANEOUS | Status: DC
  Filled 2014-02-01 (×2): qty 0.15

## 2014-02-01 MED ORDER — INSULIN GLARGINE 100 UNIT/ML ~~LOC~~ SOLN
15.0000 [IU] | Freq: Two times a day (BID) | SUBCUTANEOUS | Status: DC
  Administered 2014-02-01 – 2014-02-02 (×2): 15 [IU] via SUBCUTANEOUS
  Filled 2014-02-01 (×3): qty 0.15

## 2014-02-01 MED ORDER — LORATADINE 10 MG PO TABS
10.0000 mg | ORAL_TABLET | Freq: Every day | ORAL | Status: DC
  Administered 2014-02-01 – 2014-02-05 (×5): 10 mg via ORAL
  Filled 2014-02-01 (×5): qty 1

## 2014-02-01 NOTE — Progress Notes (Signed)
Jason [name] for Infectious Disease  Day # 4 vancomycin, aztreonam, acyclovir, Day # 3 levaquin Day # 3 fluconazole  Subjective: No new complaints   Antibiotics:  Anti-infectives   Start     Dose/Rate Route Frequency Ordered Stop   01/31/14 1800  vancomycin (VANCOCIN) IVPB 1000 mg/200 mL premix     1,000 mg 200 mL/hr over 60 Minutes Intravenous Every 8 hours 01/31/14 1233     01/30/14 1600  acyclovir (ZOVIRAX) 640 mg in dextrose 5 % 100 mL IVPB     640 mg 112.8 mL/hr over 60 Minutes Intravenous Every 8 hours 01/30/14 1431     01/30/14 1300  levofloxacin (LEVAQUIN) IVPB 500 mg  Status:  Discontinued     500 mg 100 mL/hr over 60 Minutes Intravenous Every 24 hours 01/30/14 1248 02/01/14 1418   01/30/14 1245  fluconazole (DIFLUCAN) IVPB 100 mg     100 mg 50 mL/hr over 60 Minutes Intravenous Every 24 hours 01/30/14 1248     01/29/14 2200  vancomycin (VANCOCIN) 500 mg in sodium chloride 0.9 % 100 mL IVPB  Status:  Discontinued     500 mg 100 mL/hr over 60 Minutes Intravenous Every 12 hours 01/29/14 0952 01/29/14 2022   01/29/14 2200  vancomycin (VANCOCIN) 500 mg in sodium chloride 0.9 % 100 mL IVPB  Status:  Discontinued     500 mg 100 mL/hr over 60 Minutes Intravenous Every 12 hours 01/29/14 2034 01/31/14 1233   01/29/14 2100  aztreonam (AZACTAM) 1 g in dextrose 5 % 50 mL IVPB  Status:  Discontinued     1 g 100 mL/hr over 30 Minutes Intravenous 3 times per day 01/29/14 2031 02/01/14 1418   01/29/14 1000  vancomycin (VANCOCIN) IVPB 1000 mg/200 mL premix     1,000 mg 200 mL/hr over 60 Minutes Intravenous STAT 01/29/14 0952 01/29/14 1237   01/29/14 0945  aztreonam (AZACTAM) 2 g in dextrose 5 % 50 mL IVPB  Status:  Discontinued     2 g 100 mL/hr over 30 Minutes Intravenous  Once 01/29/14 0939 01/29/14 2022      Medications: Scheduled Meds: . acyclovir  640 mg Intravenous Q8H  . chlorhexidine  15 mL Mouth Rinse BID  . collagenase   Topical Daily  . fluconazole (DIFLUCAN)  IV  100 mg Intravenous Q24H  . insulin aspart  0-20 Units Subcutaneous 6 times per day  . insulin glargine  15 Units Subcutaneous BID  . lidocaine  1 patch Transdermal Q24H  . loratadine  10 mg Oral Daily  . [START ON 02/02/2014] methylPREDNISolone (SOLU-MEDROL) injection  1,000 mg Intravenous Daily  . methylPREDNISolone (SOLU-MEDROL) injection  500 mg Intravenous Q12H  . morphine  30 mg Oral Q12H  . sodium chloride  3 mL Intravenous Q12H  . vancomycin  1,000 mg Intravenous Q8H   Continuous Infusions: . sodium chloride 100 mL/hr at 02/01/14 1200   PRN Meds:.acetaminophen, acetaminophen, methocarbamol (ROBAXIN) IV, metoCLOPramide, ondansetron (ZOFRAN) IV, ondansetron, oxyCODONE, polyethylene glycol    Objective: Weight change: 9 lb 11.2 oz (4.4 kg)  Intake/Output Summary (Last 24 hours) at 02/01/14 1419 Last data filed at 02/01/14 1211  Gross per 24 hour  Intake 3909.6 ml  Output   1095 ml  Net 2814.6 ml   Blood pressure 109/75, pulse 75, temperature 97.7 F (36.5 C), temperature source Oral, resp. rate 20, height 5' 7.5" (1.715 m), weight 149 lb 4 oz (67.7 kg), SpO2 99.00%. Temp:  [97.7 F (36.5 C)-98.7 F (37.1  C)] 97.7 F (36.5 C) (04/27 1214) Pulse Rate:  [73-95] 75 (04/27 1200) Resp:  [8-28] 20 (04/27 1200) BP: (109-145)/(73-92) 109/75 mmHg (04/27 1200) SpO2:  [96 %-100 %] 99 % (04/27 1200) Weight:  [149 lb 4 oz (67.7 kg)-151 lb 3.8 oz (68.6 kg)] 149 lb 4 oz (67.7 kg) (04/27 1214)  Physical Exam: General: Alert and awake, oriented x3, not in any acute distress. HEENT: anicteric sclera, pupils reactive to light and accommodation, EOMI CVS regular rate, normal r,  no murmur rubs or gallops Chest: clear to auscultation bilaterally, no wheezing, rales or rhonchi Abdomen: nondistended, normal bowel sounds, Neuro: bilateral LE paryalysis  Skin: Right elbow with healed prior MRSA boil    Right foot plantar ulcers     Left foot ulcer:    Left shin  ulcerations:       Right shin ulcerations x 2       CBC:  Recent Labs Lab 01/29/14 0858 01/29/14 2113 01/30/14 0428 01/31/14 0250 02/01/14 0305  HGB 9.4*  --  8.4* 8.0* 8.6*  HCT 28.1*  --  25.6* 24.7* 26.5*  PLT 462*  --  434* 447* 466*  INR  --  1.12  --   --   --      BMET  Recent Labs  01/31/14 0250 02/01/14 0305  NA 138 132*  K 3.8 3.8  CL 103 98  CO2 24 22  GLUCOSE 163* 246*  BUN 14 11  CREATININE 0.79 0.78  CALCIUM 8.2* 8.2*     Liver Panel   Recent Labs  01/30/14 0428 02/01/14 0305  PROT 6.7 6.9  ALBUMIN 1.5* 1.4*  AST 9 7  ALT 7 5  ALKPHOS 171* 152*  BILITOT 0.3 0.3       Sedimentation Rate  Recent Labs  01/30/14 1440  ESRSEDRATE 102*   C-Reactive Protein  Recent Labs  01/29/14 2113 01/30/14 1440  CRP 29.4* 26.1*    Micro Results: Recent Results (from the past 240 hour(s))  CULTURE, BLOOD (ROUTINE X 2)     Status: None   Collection Time    01/29/14  9:15 AM      Result Value Ref Range Status   Specimen Description BLOOD LEFT ARM   Final   Special Requests BOTTLES DRAWN AEROBIC AND ANAEROBIC 5CC EACH   Final   Culture  Setup Time     Final   Value: 01/29/2014 14:12     Performed at [facility]   Culture     Final   Value:        BLOOD CULTURE RECEIVED NO GROWTH TO DATE CULTURE WILL BE HELD FOR 5 DAYS BEFORE ISSUING A FINAL NEGATIVE REPORT     Performed at [facility]   Report Status PENDING   Incomplete  URINE CULTURE     Status: None   Collection Time    01/29/14  9:28 AM      Result Value Ref Range Status   Specimen Description URINE, CATHETERIZED   Final   Special Requests NONE   Final   Culture  Setup Time     Final   Value: 01/29/2014 14:22     Performed at [facility]     Final   Value: NO GROWTH     Performed at [facility]   Culture     Final   Value: NO GROWTH     Performed at [facility]   Report  Status 01/30/2014 FINAL   Final   CULTURE, BLOOD (ROUTINE X 2)     Status: None   Collection Time    01/29/14  9:35 AM      Result Value Ref Range Status   Specimen Description BLOOD RIGHT ARM   Final   Special Requests BOTTLES DRAWN AEROBIC AND ANAEROBIC 5CC BOTH   Final   Culture  Setup Time     Final   Value: 01/29/2014 14:12     Performed at [facility]   Culture     Final   Value:        BLOOD CULTURE RECEIVED NO GROWTH TO DATE CULTURE WILL BE HELD FOR 5 DAYS BEFORE ISSUING A FINAL NEGATIVE REPORT     Performed at [facility]   Report Status PENDING   Incomplete  MRSA PCR SCREENING     Status: None   Collection Time    01/30/14 11:59 AM      Result Value Ref Range Status   MRSA by PCR NEGATIVE  NEGATIVE Final   Comment:            The GeneXpert MRSA Assay (FDA     approved for NASAL specimens     only), is one component of a     comprehensive MRSA colonization     surveillance program. It is not     intended to diagnose MRSA     infection nor to guide or     monitor treatment for     MRSA infections.  GRAM STAIN     Status: None   Collection Time    01/30/14 10:45 PM      Result Value Ref Range Status   Specimen Description CSF   Final   Special Requests NO4 [facility]   Final   Gram Stain     Final   Value: WBC PRESENT,BOTH PMN AND MONONUCLEAR     NO ORGANISMS SEEN     CYTOSPIN SLIDE   Report Status 01/30/2014 FINAL   Final  CSF CULTURE     Status: None   Collection Time    01/30/14 10:45 PM      Result Value Ref Range Status   Specimen Description CSF   Final   Special Requests CSF NO4 [place]   Final   Gram Stain     Final   Value: CYTOSPIN SLIDE WBC PRESENT,BOTH PMN AND MONONUCLEAR     NO ORGANISMS SEEN     Performed at [facility]     Performed at [facility]   Culture PENDING   Incomplete   Report Status PENDING   Incomplete  FUNGAL STAIN     Status: None   Collection Time    01/30/14 10:45 PM      Result Value Ref Range Status   Specimen Description CSF    Final   Special Requests CSF NO4 [facility]   Final   Fungal Smear     Final   Value: NO YEAST OR FUNGAL ELEMENTS SEEN     Performed at [facility]   Report Status 01/31/2014 FINAL   Final    Studies/Results: No results found.    Assessment/Plan:  Principal Problem:   HCAP (healthcare-associated pneumonia) Active Problems:   Type 1 diabetes, uncontrolled, with neuropathy   GERD   Painful diabetic neuropathy   Chronic low back pain   Gastroparesis due to DM   Protein-calorie malnutrition, severe   Leg weakness  Bilateral lower leg cellulitis   Altered mental status   Transverse myelitis    Jason [name] is a 28 y.o. male with  DM, peripheral neuropathy with poor sensation in feet sp burn to feet with heating pad  use or worn at night with ulcerations of the care for the wound care by [facility] Companion. His had debridement of these ulcers and been on various courses of antibiotics. He is admitted with flaccid lower extremity paralysis and been found to have transverse myelitis.  #1 transverse myelitis: No clear-cut relationship to any specific infectious disease I doubt there is any active infectious disease driving this process. --I. agree with IV corticosteroids and other immunomodulatory therapy per neurology  --Reasonable to continue acyclovir until we have herpes simplex PCR and VZV pcr back  #2 Lower extremity Diabetic foot ulcers:  Plain films do not show osteomyelitis  ESR has been persistently high since December though he had two acute events when both measurements were taken  I would propose getting MRI of both feet to ensure no osteomyelitis given chronicity of ssx  I would also recommend getting ABIs at some course during his stay  Hopefully pressure off loading and wound care will improve these  I will de-escalate his abx to vancomycin alone, and likely go to an oral drug such as doxy unless he has evidence of osteomyelitis  Check A1c, and try to  optimize glycemic control now complicated by his need for corticosteroids.  I spent greater than 40 minutes with the patient including greater than 50% of time in face to face counsel of the patient. Mother and fiance and in coordination of their care.   #3 screening: HIV and syphilis tests have been negative we'll send a hepatitis panel with a.m. labs         LOS: 3 days   Truman Hayward 02/01/2014, 2:19 PM

## 2014-02-01 NOTE — Progress Notes (Signed)
Occupational Therapy Evaluation Patient Details Name: Jason [name] MRN: [id number] DOB: Feb 06, 1986 Today's Date: 02/01/2014    History of Present Illness pt presents weakness x 4 extremeties. Ruling out Transverse Myelitis.  Sepsis, DM, Bil LE Cellulitis, and Bil LE wounds      Clinical Impression   PTA, pt was independent with all ADL and mobility. Occasionally used cane or RW if needed. Pt presents with below deficits and would benefit from skilled OT to max independence with ADL with use of AE and compensatory techniques to facilitate D/C to CIR. Family able to provide 24/7 assist after D/C. Pt at high risk for skin breakdown- Rec air mattress. Discussed with facilities to hook up soft touch call bell.     Follow Up Recommendations  CIR;Supervision/Assistance - 24 hour     Equipment Recommendations  3 in 1 bedside comode;Tub/shower bench;Wheelchair (measurements OT);Wheelchair cushion (measurements OT);Hospital bed    Recommendations for Other Services Rehab consult Needs Air Mattress     Precautions / Restrictions Precautions Precautions: Fall;Other (comment) (at risk for skin breakdown) Required Braces or Orthoses: Other Brace/Splint Other Brace/Splint: B prevalon boots Restrictions Weight Bearing Restrictions: No      Mobility Bed Mobility Overal bed mobility: Needs Assistance;+2 for physical assistance Bed Mobility: Sidelying to Sit;Sit to Supine   Sidelying to sit: Total assist;+2 for physical assistance Supine to sit: Total assist;+2 for physical assistance Sit to supine: Total assist;+2 for physical assistance   General bed mobility comments: Trying to assist with BUE  Transfers Overall transfer level: Needs assistance               General transfer comment: not assessed this date. Will need transfer board  by OT or lift by nursing - +2    Balance Overall balance assessment: Needs assistance Sitting-balance support: Bilateral upper extremity  supported;Feet supported Sitting balance-Leahy Scale: Zero Sitting balance - Comments: Using BUE to help prop while sitting EOB. difficulty maintaining midline trunk control - max A                                    ADL Overall ADL's : Needs assistance/impaired Eating/Feeding: Total assistance   Grooming: Total assistance                               Functional mobility during ADLs:  (did not get pt OOB today. Will need lift at this time) General ADL Comments: total A with all ADL bed level     Vision    reports no changes                 Perception     Praxis      Pertinent Vitals/Pain VSS C/o pain on tailbone - repositioned in sidelying     Hand Dominance Left   Extremity/Trunk Assessment Upper Extremity Assessment Upper Extremity Assessment: RUE deficits/detail;LUE deficits/detail RUE Deficits / Details: shoulder elbow and wrist AROM. strength  shoulders @ 3+/5, elbow flex 3+/5; elbow ext and wrist 3/5. no active finger movement observed with the exception of minimal thumb extension. Using tenodesis type grasp. minimal gross finger flexion compositely.  RUE Sensation: decreased light touch;history of peripheral neuropathy (reports his arms fill "gel filled") RUE Coordination: decreased fine motor;decreased gross motor LUE Deficits / Details: weaker then RUE. AROM same as RUE with the exception of elbow ext 2+/5. no  active movement of hand at all. LUE Sensation: decreased light touch;history of peripheral neuropathy LUE Coordination: decreased fine motor;decreased gross motor   Lower Extremity Assessment Lower Extremity Assessment: Defer to PT evaluation (no active movemetn at all. BLE wounds. Prevalon boots B)   Cervical / Trunk Assessment Cervical / Trunk Assessment: Other exceptions Cervical / Trunk Exceptions: poor postural control posterior pelvic tilt. Using BUE to propr   Communication Communication Communication: No  difficulties   Cognition Arousal/Alertness: Awake/alert Behavior During Therapy: Flat affect Overall Cognitive Status: Within Functional Limits for tasks assessed                     General Comments   Will return this pm with AE for feeding.    Exercises       Shoulder Instructions      Home Living [name] Living Arrangements: Spouse/significant other;Children                               Additional Comments: Able to provide 24/7 S after D/C. 1 level home. level entry. bathroom not accessible      Prior Functioning/Environment Level of Independence: Independent        Comments: on disability    OT Diagnosis: Generalized weakness;Paresis   OT Problem List: Decreased strength;Decreased range of motion;Decreased activity tolerance;Impaired balance (sitting and/or standing);Decreased coordination;Decreased safety awareness;Decreased knowledge of use of DME or AE;Decreased knowledge of precautions;Impaired sensation;Impaired tone;Impaired UE functional use;Pain;Increased edema   OT Treatment/Interventions: Self-care/ADL training;Therapeutic exercise;Neuromuscular education;Energy conservation;DME and/or AE instruction;Therapeutic activities;Patient/family education;Balance training    OT Goals(Current goals can be found in the care plan section) Acute Rehab OT Goals Patient Stated Goal: to do more for myself OT Goal Formulation: With patient Time For Goal Achievement: 02/15/14 Potential to Achieve Goals: Good  OT Frequency: Min 3X/week   Barriers to D/C:            Co-evaluation              End of Session Nurse Communication: Mobility status;Other (comment) (need to complete dressing changes; need for air mattress)  Activity Tolerance: Patient tolerated treatment well Patient left: in bed;with call bell/phone within reach;with family/visitor present   Time: 0981-1914 OT Time  Calculation (min): 31 min Charges:  OT General Charges $OT Visit: 1 Procedure OT Evaluation $Initial OT Evaluation Tier I: 1 Procedure OT Treatments $Self Care/Home Management : 23-37 mins G-Codes:    Jason [name] Jason [name] Feb 21, 2014, 3:20 PM   [facility], OTR/L  [phone] 02-21-14

## 2014-02-01 NOTE — Progress Notes (Signed)
02/01/2014 patient transfer from 23m to 2central at 1330. Patient is alert, oriented and non ambulatory. Patient is able to move arm, but unable to move legs. She is total care and assist with feeding. He have a unstage area to right elbow it is 4.0x3.0 and it is dry. On the right lower leg 0.5x0.5. The great toe 1.5x1.5 the left lower leg 2.0x1.5 and great toe 1.5x1.5 and another area on the left leg 3.0x3.0 unstage diabetic ulcers. On the left knee 0.5x0.5. Patient have protective boot on feet heels are are a little dry and he is a total care patient and q2 turn. [facility] RN.

## 2014-02-01 NOTE — Progress Notes (Signed)
NEURO HOSPITALIST PROGRESS NOTE   SUBJECTIVE:                                                                                                                        In good spirits but neurological status essentially unchanged. IV solumedrol 1 gram x 3 doses. On Aztreonam/Vanco/Levaquin/Acyclovir/Fluconazole ( day # 3)  OBJECTIVE:                                                                                                                           Vital signs in last 24 hours: Temp:  [97.7 F (36.5 C)-99.6 F (37.6 C)] 97.7 F (36.5 C) (04/27 0800) Pulse Rate:  [73-95] 76 (04/27 0900) Resp:  [8-28] 15 (04/27 0900) BP: (112-145)/(74-92) 129/87 mmHg (04/27 0900) SpO2:  [96 %-100 %] 99 % (04/27 0900) Weight:  [68.6 kg (151 lb 3.8 oz)] 68.6 kg (151 lb 3.8 oz) (04/27 0400)  Intake/Output from previous day: 04/26 0701 - 04/27 0700 In: 4176.4 [P.O.:1170; I.V.:1650; IV Piggyback:1356.4] Out: 1145 [Urine:1145] Intake/Output this shift: Total I/O In: 300 [I.V.:100; IV Piggyback:200] Out: 40 [Urine:40] Nutritional status: Carb Control  Past Medical History  Diagnosis Date  . Diabetes mellitus   . GERD (gastroesophageal reflux disease)   . Asthma   . Hx MRSA infection     on face  . Gastroparesis   . Diabetic neuropathy   . Seizures     Neurologic Exam:  Alert, awake, oriented x 4. Comprehension, naming, and repetition intact. Does follow complex commands without difficulty  Cranial Nerves:  II: Discs flat bilaterally; pupils equal, round, reactive to light and accommodation, visual fields intact.  III,IV, VI: ptosis not present, extra-ocular motions intact bilaterally  V,VII: grimace symmetric  VIII: hearing unable to be tested  IX,X: gag reflex present  XI: bilateral shoulder shrug  XII: tongue extension not able to be tested  Motor:  Moves upper extremities spontaneously and has normal tone in the upper extremities, but seems to  have weakness triceps and wrist extensors bilaterally  Left Lower extremity 0/5 Right Lower extremity 0/5  Flaccid in the lower extremities  Sensory: Appreciates pain in all extremities but unable to determine a sensory level.  Deep Tendon Reflexes: 2+ in the upper  extremities and absent in the lower extremities  Plantars:  Unable to test due to sores.  Cerebellar:  Unable to test  Gait: Unable to test   Lab Results: No results found for this basename: cbc, bmp, coags, chol, tri, ldl, hga1c   Lipid Panel No results found for this basename: CHOL, TRIG, HDL, CHOLHDL, VLDL, LDLCALC,  in the last 72 hours  Studies/Results: No results found.  MEDICATIONS                                                                                                                       I have reviewed the patient's current medications.  ASSESSMENT/PLAN:                                                                                                            28 y/o with a neurological and neuroimaging picture suggestive of transverse myelitis. However, CSF is not consistent with infection. Has bilateral LE cellulitis.  Except for elevated proteins, the CSF is unimpressive so far.  Plan to continue high dose IV methylprednisolone x 2 more days and repeat MRI cervical and thoracic spine with and without contrast today.  Will follow up.  Dorian Pod, MD [facility] [phone]  02/01/2014, 10:58 AM

## 2014-02-01 NOTE — Progress Notes (Signed)
UR completed.  Danell Verno, RN BSN [facility] CCM Trauma/Neuro ICU Case Manager [phone]  

## 2014-02-01 NOTE — Consult Note (Signed)
PULMONARY / CRITICAL CARE MEDICINE   Name: Jason [name] MRN: [id number] DOB: 04/18/1986    ADMISSION DATE:  01/29/2014 CONSULTATION DATE:  4/25  REFERRING MD :  DHUNGEL  PRIMARY SERVICE: triad>>>PCCM   CHIEF COMPLAINT:  Sepsis/ MS change   BRIEF PATIENT DESCRIPTION:  28 year old male w/ sig h/o DM, DM related neuropathy, diabetic ulcers (which had been worse over the week prior to admit), and gastroparesis,  admitted on 4/24 w/ CC: leg weakness. He was admitted w/ working dx of bilateral LE cellulitis, possible PNA, bilateral leg weakness, Acute on chronic renal injury and hyperglycemia. MRI of the brain/ C spine obtained showed Chiari I and some cord enlargement that may be a syrinx vs a TM. Treatment to date had included IVFs, Insulin gtt which was c/b hypoglycemia, and empiric abx. PCCM was consulted on 4/25 given concern that the worsening lethargy and progressive upper extremity weakness.    SIGNIFICANT EVENTS / STUDIES:  MRI brain 4/24 1. No acute intracranial abnormality identified. 2. 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 MRA BRAIN 4/24: 1. Limited study due to motion. 2. No proximal branch occlusion or definite high-grade flow-limiting stenosis within the intracranial circulation. MRI C spine 4/24: 1. T2 hyperintense intra medullary cystic lesion extending from the C4 through T1-2 level as above. 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 Given the history of new onset flaccid paralysis, possible transverse myelitis could also be considered HIV RNA 4/25>>> Lyme 4/25>>> RPR 4/25>>> ANA 4/25>>> ANCA 4/25>>> RF 4/25>>> ESR 4/25>>> CRP 4/25>>>   LINES / TUBES:   CULTURES: BCX2 4/24>>> UC 4/24>>>  ANTIBIOTICS: vanc 4/24>>> azactam 4/24>>> levaquin 4/25>>> Acyclovir 4/25>>> fluc 4/25>>>   SUBJECTIVE:  Moves arms up and down  VITAL SIGNS: Temp:  [97.7 F (36.5 C)-99.6 F (37.6  C)] 97.7 F (36.5 C) (04/27 0800) Pulse Rate:  [73-95] 76 (04/27 0900) Resp:  [8-28] 15 (04/27 0900) BP: (112-145)/(74-92) 129/87 mmHg (04/27 0900) SpO2:  [96 %-100 %] 99 % (04/27 0900) Weight:  [68.6 kg (151 lb 3.8 oz)] 68.6 kg (151 lb 3.8 oz) (04/27 0400) HEMODYNAMICS:   VENTILATOR SETTINGS:   INTAKE / OUTPUT: Intake/Output     04/26 0701 - 04/27 0700 04/27 0701 - 04/28 0700   P.O. 1170    I.V. (mL/kg) 1650 (24.1) 100 (1.5)   IV Piggyback 1356.4 200   Total Intake(mL/kg) 4176.4 (60.9) 300 (4.4)   Urine (mL/kg/hr) 1145 (0.7) 40 (0.2)   Total Output 1145 40   Net +3031.4 +260        Stool Occurrence 1 x      PHYSICAL EXAMINATION: General:  Chronically ill appearing 28 year male looks much older than stated age Neuro:  Can't move LEs, progressive upper extremity gross motor equal up and down HEENT:  Poor dentition  Cardiovascular:  rrr Lungs:  Coarse Abdomen:  Soft, non-tender + bowel sounds  Musculoskeletal:  Unable to move UEs, gross motor uppers only.  Skin:  LE ulcer dressings intact   LABS:  CBC  Recent Labs Lab 01/30/14 0428 01/31/14 0250 02/01/14 0305  WBC 33.6* 33.5* 38.0*  HGB 8.4* 8.0* 8.6*  HCT 25.6* 24.7* 26.5*  PLT 434* 447* 466*   Coag's  Recent Labs Lab 01/29/14 2113  INR 1.12   BMET  Recent Labs Lab 01/30/14 0428 01/31/14 0250 02/01/14 0305  NA 138 138 132*  K 4.2 3.8 3.8  CL 104 103   98  CO2 21 24 22  BUN 20 14 11  CREATININE 0.94 0.79 0.78  GLUCOSE 227* 163* 246*   Electrolytes  Recent Labs Lab 01/29/14 2113 01/30/14 0428 01/31/14 0250 02/01/14 0305  CALCIUM 8.4 8.5 8.2* 8.2*  MG 1.7  --   --   --   PHOS 3.1  --   --   --    Sepsis Markers  Recent Labs Lab 01/29/14 0918  LATICACIDVEN 2.12   ABG  Recent Labs Lab 01/29/14 2350 01/30/14 0829  PHART 7.417 7.426  PCO2ART 40.2 38.9  PO2ART 52.6* 96.3   Liver Enzymes  Recent Labs Lab 01/29/14 2113 01/30/14 0428 02/01/14 0305  AST 12 9 7  ALT 8 7 5   ALKPHOS 190* 171* 152*  BILITOT 0.3 0.3 0.3  ALBUMIN 1.5* 1.5* 1.4*   Cardiac Enzymes  Recent Labs Lab 01/29/14 2113  PROBNP 1343.0*   Glucose  Recent Labs Lab 01/31/14 0824 01/31/14 1601 01/31/14 1913 01/31/14 2351 02/01/14 0317 02/01/14 0829  GLUCAP 170* 260* 249* 245* 217* 182*    Imaging No results found.   CXR:  None new  ASSESSMENT / PLAN:  PULMONARY A: possible aspiration PNA  P:   IS if able  CARDIOVASCULAR A: sepsis resolved, BP wnl P:  Keep even volume status to slightpos  RENAL A:   AKI resolved Not emptying bladder from cord P:   Keep even to + volume status Chem in am Foley stays as retianing  GASTROINTESTINAL A:   Diabetic gastroparesis  GERD P:   PPI Aspiration precautions Advance diet  HEMATOLOGIC A:   Chronic anemia (microcytic/hypochromic) P:  Ck anemia panel  hemeoccult stools Hold heparin no good place to put SCDs will try LEs.    INFECTIOUS A:   SIRS/sepsis R/o transverse myelitis  thrush Infected diabetic foot ulcers Possible aspiration   Appreciate  ID comments:   P:   Per ID Get diff wbc in am  LP reviewed, unimpressive  ENDOCRINE A:  DM, steroids P:   cbg q 4 w/sensitive scale  NEUROLOGIC A:   Progressive weakness R/o transverse myelitis  P:   Neuro following Cont IV abx  TODAY'S SUMMARY: LP unimpressive, steroids, per ID abx, advance diet, awiat ID studies  To sdu, triad  Daniel J. Feinstein, MD, FACP Pgr: [phone] [name]  

## 2014-02-01 NOTE — Progress Notes (Signed)
Rehab Admissions Coordinator Note:  Patient was screened by Retta Diones for appropriateness for an Inpatient Acute Rehab Consult.  At this time, we are recommending Inpatient Rehab consult.  Jason [name] 02/01/2014, 1:05 PM  I can be reached at [phone].

## 2014-02-01 NOTE — Progress Notes (Signed)
Occupational Therapy Treatment Patient Details Name: Jason [name] MRN: [id number] DOB: 08-Oct-1986 Today's Date: 02/01/2014    History of present illness pt presents weakness x 4 extremeties. Ruling out Transverse Myelitis.  Sepsis, DM, Bil LE Cellulitis, and Bil LE wounds from burning feet with slippers.     OT comments  Seen this pm to begin addressing self feeding. Built up tubing use on utensil. Teaching pt to stabilize with non dominat hand. Also given "sippy cup" with handle to loop thumb in to increase independence with drinking. Educated family on use of AE and proper set up. Pt now with soft touch call bell. Will continue to monitor need for splinting for B hands. Will continue to follow to address goals. Excellent CIR candidate.   Follow Up Recommendations  CIR;Supervision/Assistance - 24 hour    Equipment Recommendations  3 in 1 bedside comode;Tub/shower bench;Wheelchair (measurements OT);Wheelchair cushion (measurements OT);Hospital bed    Recommendations for Other Services Rehab consult    Precautions / Restrictions Precautions Precautions: Fall;Other (comment) Required Braces or Orthoses: Other Brace/Splint Other Brace/Splint: B prevalon boots Restrictions Weight Bearing Restrictions: No       Mobility Bed Mobility  Transfers Overall transfer level: Needs assistance               General transfer comment: not assessed this date. Will need transfer board  by OT or lift by nursing - +2    Balance                           ADL Overall ADL's : Needs assistance/impaired Eating/Feeding: Maximal assistance;With adaptive utensils;With assist to don/doff brace/orthosis;Cueing for compensatory techinques;Sitting;Bed level   Grooming: Total assistance                               Functional mobility during ADLs:  (did not get pt OOB today. Will need lift at this time) General ADL Comments: Worked with pt on feeding @ bed level with  HOB elevated and pt turned toward R side. With proper set up and built up grip, pt able to fed self with minimal spillage. Educated fiance on use of AE and importance of proper positioning. Also notified nsg. Stressed importance of family to participate in attending to pt changing positions q 2 hrs to prevent pressure areas.       Vision                     Perception     Praxis      Cognition   Behavior During Therapy: Flat affect Overall Cognitive Status: Within Functional Limits for tasks assessed                       Extremity/Trunk Assessment    Exercises     Shoulder Instructions       General Comments      Pertinent Vitals/ Pain       no apparent distress   Home Living [name] Living Arrangements: Spouse/significant other;Children                               Additional Comments: Able to provide 24/7 S after D/C. 1 level home. level entry. bathroom not accessible      Prior Functioning/Environment Level of Independence:  Independent        Comments: on disability   Frequency Min 3X/week     Progress Toward Goals  OT Goals(current goals can now be found in the care plan section)     Acute Rehab OT Goals Patient Stated Goal: to do more for myself OT Goal Formulation: With patient Time For Goal Achievement: 02/15/14 Potential to Achieve Goals: Good ADL Goals Pt Will Perform Eating: with min assist;with assist to don/doff brace/orthosis;with adaptive utensils;sitting Pt Will Perform Grooming: with min assist;sitting;with adaptive equipment Pt/caregiver will Perform Home Exercise Program: Increased strength;Both right and left upper extremity;With theraputty;With written HEP provided Additional ADL Goal #1: Maintain sitting balance EOB x 10 min with min A for ADL Additional ADL Goal #2: Demonstrate funcitonal pinch pattern R/L hand using tenodesis/compensatory technique  to increase independence with ADL  Plan      Co-evaluation                 End of Session     Activity Tolerance Patient tolerated treatment well   Patient Left in bed;with call bell/phone within reach;with family/visitor present   Nurse Communication Mobility status;Other (comment) (need to complete dressing changes; need for air mattress)        Time: 1440-1458 OT Time Calculation (min): 18 min  Charges: OT General Charges $OT Visit: 1 Procedure OT Evaluation $Initial OT Evaluation Tier I: 1 Procedure OT Treatments $Self Care/Home Management : 8-22 mins  Roney Jaffe Idil Maslanka 02/01/2014, 3:30 PM   Maurie Boettcher, OTR/L  [phone] 02/01/2014

## 2014-02-01 NOTE — Evaluation (Signed)
Physical Therapy Evaluation Patient Details Name: Jason [name] MRN: [id number] DOB: [birth year] Today's Date: 02/01/2014   History of Present Illness  pt presents with Transverse Myelitis, Sepsis, DM, Bil LE Cellulitis, and Bil LE wounds.    Clinical Impression  Pt with no active movement throughout Bil LEs and minimal in trunk.  Pt appears to have intact head control, but no postural control in sitting.  Pt has diminished sensation essentially below cervical region.  At this point will need to consider CIR at D/C to maximize mobility and decrease burden of care prior to return to home.  Will continue to follow.      Follow Up Recommendations CIR    Equipment Recommendations   (TBD)    Recommendations for Other Services Rehab consult     Precautions / Restrictions Precautions Precautions: Fall Restrictions Weight Bearing Restrictions: No      Mobility  Bed Mobility Overal bed mobility: Needs Assistance Bed Mobility: Supine to Sit;Sit to Supine     Supine to sit: Total assist;HOB elevated Sit to supine: Total assist;HOB elevated   General bed mobility comments: pt able to move UEs towards EOB, however does not use them to A with bed mobility.  When attempting to cue pt to put arm through rail to attempt to pull up to sitting, pt states "I can't".    Transfers                    Ambulation/Gait                Stairs            Wheelchair Mobility    Modified Rankin (Stroke Patients Only)       Balance Overall balance assessment: Needs assistance Sitting-balance support: Bilateral upper extremity supported;Feet supported Sitting balance-Leahy Scale: Zero Sitting balance - Comments: pt without ability to control trunk in sitting.  Does have intact head control.                                       Pertinent Vitals/Pain Denied pain.      Home Living [name]                      Prior Function Level of Independence: Independent               Hand Dominance        Extremity/Trunk Assessment   Upper Extremity Assessment: Defer to OT evaluation           Lower Extremity Assessment: RLE deficits/detail;LLE deficits/detail RLE Deficits / Details: No active movement noted.  Sensation is diminished with pt describing it as "alittle numby", but does have some sensation.   LLE Deficits / Details: No active movement noted.  pt with diminished sensation with pt decribing it as "a little numby".    Cervical / Trunk Assessment: Other exceptions  Communication   Communication: Expressive difficulties (Per mother pt is slurred and slower to respond.  )  Cognition Arousal/Alertness: Awake/alert Behavior During Therapy: WFL for tasks assessed/performed Overall Cognitive Status: Within Functional Limits for tasks assessed                      General Comments General comments (skin integrity, edema, etc.): pt with multiple diabetic ulcers on Bil LEs and also noted  on UEs.      Exercises        Assessment/Plan    PT Assessment Patient needs continued PT services  PT Diagnosis Difficulty walking;Generalized weakness   PT Problem List Decreased strength;Decreased activity tolerance;Decreased balance;Decreased mobility;Decreased coordination;Decreased knowledge of use of DME;Cardiopulmonary status limiting activity;Impaired sensation  PT Treatment Interventions DME instruction;Gait training;Functional mobility training;Therapeutic activities;Therapeutic exercise;Balance training;Neuromuscular re-education;Patient/family education   PT Goals (Current goals can be found in the Care Plan section) Acute Rehab PT Goals Patient Stated Goal: Back to normal PT Goal Formulation: With patient Time For Goal Achievement: 02/15/14 Potential to Achieve Goals: Good    Frequency Min 4X/week   Barriers to discharge        Co-evaluation                End of Session   Activity Tolerance: Patient tolerated treatment well Patient left: in bed;with call bell/phone within reach;with family/visitor present Nurse Communication: Mobility status;Need for lift equipment         Time: 1125-1151 PT Time Calculation (min): 26 min   Charges:   PT Evaluation $Initial PT Evaluation Tier I: 1 Procedure PT Treatments $Therapeutic Activity: 8-22 mins   PT G CodesCatarina Hartshorn, PT [phone] 02/01/2014, 12:00 PM

## 2014-02-01 NOTE — Progress Notes (Signed)
Inpatient Diabetes Program Recommendations  AACE/ADA: New Consensus Statement on Inpatient Glycemic Control (2013)  Target Ranges:  Prepandial:   less than 140 mg/dL      Peak postprandial:   less than 180 mg/dL (1-2 hours)      Critically ill patients:  140 - 180 mg/dL   Results for Jason [name], Jason [name] (MRN [id number]) as of 02/01/2014 10:30  Ref. Range 01/31/2014 16:01 01/31/2014 19:13 01/31/2014 23:51 02/01/2014 03:17 02/01/2014 08:29  Glucose-Capillary Latest Range: 70-99 mg/dL 260 (H) 249 (H) 245 (H) 217 (H) 182 (H)   Diabetes history: DM1 Outpatient Diabetes medications: Lantus 20 units QHS, Novolog 1-17 units QID Current orders for Inpatient glycemic control: Novolog 0-20 units Q4H  Inpatient Diabetes Program Recommendations Insulin - Basal: There was an order for Lantus 10 units BID which was ordered to end after 3 doses which the patient has received. Please consider ordering Lantus 15 units BID. Insulin - Meal Coverage: If patient is eating at least 50% of meals and is tolerating diet, please consider ordering Novolog 4 units TID with meals. Diet: Diet increased to Carb Modified today.  Note: Noted patient was receiving Lantus 10 units BID which was ordered for 3 doses (last dose given this morning).  Therefore, there are not any further orders for basal insulin at this time. Noted pt is ordered to receive Solumedrol 500 mg Q12H which is contributing to hyperglycemia. On 01/31/14, patient received 2 doses of Lantus 10 units (total of 20 units of Lantus) and a total of Novolog 30 units for correction. Please consider ordering Lantus 15 units BID and if pt eats at least 50% of meals, please order Novolog 4 units TID with meals. Will continue to follow.  Thanks, Barnie Alderman, RN, MSN, CCRN Diabetes Coordinator Inpatient Diabetes Program [phone] (Team Pager) [phone] ([facility] office) [phone] [facility] office)

## 2014-02-02 ENCOUNTER — Inpatient Hospital Stay (HOSPITAL_COMMUNITY): Payer: Medicaid Other

## 2014-02-02 DIAGNOSIS — R29898 Other symptoms and signs involving the musculoskeletal system: Secondary | ICD-10-CM

## 2014-02-02 LAB — CBC WITH DIFFERENTIAL/PLATELET
BASOS PCT: 0 % (ref 0–1)
Basophils Absolute: 0 10*3/uL (ref 0.0–0.1)
EOS ABS: 0 10*3/uL (ref 0.0–0.7)
EOS PCT: 0 % (ref 0–5)
HCT: 27.6 % — ABNORMAL LOW (ref 39.0–52.0)
Hemoglobin: 9.1 g/dL — ABNORMAL LOW (ref 13.0–17.0)
LYMPHS PCT: 6 % — AB (ref 12–46)
Lymphs Abs: 3 10*3/uL (ref 0.7–4.0)
MCH: 25.5 pg — ABNORMAL LOW (ref 26.0–34.0)
MCHC: 33 g/dL (ref 30.0–36.0)
MCV: 77.3 fL — ABNORMAL LOW (ref 78.0–100.0)
Monocytes Absolute: 3.5 10*3/uL — ABNORMAL HIGH (ref 0.1–1.0)
Monocytes Relative: 7 % (ref 3–12)
NEUTROS PCT: 87 % — AB (ref 43–77)
Neutro Abs: 43.2 10*3/uL — ABNORMAL HIGH (ref 1.7–7.7)
Platelets: 559 10*3/uL — ABNORMAL HIGH (ref 150–400)
RBC: 3.57 MIL/uL — AB (ref 4.22–5.81)
RDW: 14.5 % (ref 11.5–15.5)
SMEAR REVIEW: INCREASED
WBC: 49.7 10*3/uL — ABNORMAL HIGH (ref 4.0–10.5)

## 2014-02-02 LAB — GC/CHLAMYDIA PROBE AMP
CT Probe RNA: NEGATIVE
GC Probe RNA: NEGATIVE

## 2014-02-02 LAB — COMPREHENSIVE METABOLIC PANEL
ALT: 7 U/L (ref 0–53)
AST: 11 U/L (ref 0–37)
Albumin: 1.4 g/dL — ABNORMAL LOW (ref 3.5–5.2)
Alkaline Phosphatase: 495 U/L — ABNORMAL HIGH (ref 39–117)
BUN: 14 mg/dL (ref 6–23)
CO2: 24 meq/L (ref 19–32)
Calcium: 8.3 mg/dL — ABNORMAL LOW (ref 8.4–10.5)
Chloride: 101 mEq/L (ref 96–112)
Creatinine, Ser: 0.84 mg/dL (ref 0.50–1.35)
GFR calc Af Amer: >90 mL/min (ref >90)
Glucose, Bld: 176 mg/dL — ABNORMAL HIGH (ref 70–99)
Potassium: 4 mEq/L (ref 3.7–5.3)
SODIUM: 136 meq/L — AB (ref 137–147)
Total Bilirubin: 0.2 mg/dL — ABNORMAL LOW (ref 0.3–1.2)
Total Protein: 6.8 g/dL (ref 6.0–8.3)

## 2014-02-02 LAB — VANCOMYCIN, TROUGH: Vancomycin Tr: 25.3 ug/mL (ref 10.0–20.0)

## 2014-02-02 LAB — GLUCOSE, CAPILLARY
GLUCOSE-CAPILLARY: 189 mg/dL — AB (ref 70–99)
GLUCOSE-CAPILLARY: 224 mg/dL — AB (ref 70–99)
GLUCOSE-CAPILLARY: 257 mg/dL — AB (ref 70–99)
Glucose-Capillary: 140 mg/dL — ABNORMAL HIGH (ref 70–99)
Glucose-Capillary: 148 mg/dL — ABNORMAL HIGH (ref 70–99)
Glucose-Capillary: 187 mg/dL — ABNORMAL HIGH (ref 70–99)
Glucose-Capillary: 310 mg/dL — ABNORMAL HIGH (ref 70–99)

## 2014-02-02 LAB — HEPATITIS PANEL, ACUTE
HCV AB: NEGATIVE
Hep A IgM: NONREACTIVE
Hep B C IgM: NONREACTIVE
Hepatitis B Surface Ag: NEGATIVE

## 2014-02-02 LAB — HEMOGLOBIN A1C
Hgb A1c MFr Bld: 12.4 % — ABNORMAL HIGH (ref <5.7)
Mean Plasma Glucose: 309 mg/dL — ABNORMAL HIGH (ref <117)

## 2014-02-02 MED ORDER — ENOXAPARIN SODIUM 40 MG/0.4ML ~~LOC~~ SOLN
40.0000 mg | SUBCUTANEOUS | Status: DC
  Administered 2014-02-02 – 2014-02-04 (×3): 40 mg via SUBCUTANEOUS
  Filled 2014-02-02 (×4): qty 0.4

## 2014-02-02 MED ORDER — INSULIN GLARGINE 100 UNIT/ML ~~LOC~~ SOLN
22.0000 [IU] | Freq: Two times a day (BID) | SUBCUTANEOUS | Status: DC
  Administered 2014-02-02 – 2014-02-03 (×2): 22 [IU] via SUBCUTANEOUS
  Filled 2014-02-02 (×3): qty 0.22

## 2014-02-02 MED ORDER — LORAZEPAM 2 MG/ML IJ SOLN
1.0000 mg | Freq: Once | INTRAMUSCULAR | Status: AC
  Administered 2014-02-02: 1 mg via INTRAVENOUS
  Filled 2014-02-02: qty 1

## 2014-02-02 MED ORDER — DOXYCYCLINE HYCLATE 100 MG PO TABS
100.0000 mg | ORAL_TABLET | Freq: Two times a day (BID) | ORAL | Status: DC
  Administered 2014-02-02 – 2014-02-05 (×6): 100 mg via ORAL
  Filled 2014-02-02 (×7): qty 1

## 2014-02-02 MED ORDER — GADOBENATE DIMEGLUMINE 529 MG/ML IV SOLN
15.0000 mL | Freq: Once | INTRAVENOUS | Status: AC
  Administered 2014-02-02: 13 mL via INTRAVENOUS

## 2014-02-02 MED ORDER — GABAPENTIN 300 MG PO CAPS
300.0000 mg | ORAL_CAPSULE | Freq: Every day | ORAL | Status: DC
  Administered 2014-02-02 – 2014-02-04 (×3): 300 mg via ORAL
  Filled 2014-02-02 (×4): qty 1

## 2014-02-02 NOTE — Progress Notes (Signed)
ANTICOAGULATION CONSULT NOTE - Initial Consult  Pharmacy Consult for lovenox Indication: VTE prophylaxis  Allergies  Allergen Reactions  . Cefuroxime Axetil Anaphylaxis  . Penicillins Anaphylaxis    ?can take amoxicillin?  [name] [Benzonatate] Anaphylaxis  . Shellfish Allergy Itching    Patient Measurements: Height: 5' 7.5" (171.5 cm) Weight: 149 lb 4 oz (67.7 kg) IBW/kg (Calculated) : 67.25 Heparin Dosing Weight:   Vital Signs: Temp: 98.4 F (36.9 C) (04/28 1705) Temp src: Oral (04/28 1705) BP: 133/87 mmHg (04/28 1705) Pulse Rate: 78 (04/28 1705)  Labs:  Recent Labs  01/31/14 0250 02/01/14 0305 02/02/14 0309  HGB 8.0* 8.6* 9.1*  HCT 24.7* 26.5* 27.6*  PLT 447* 466* 559*  CREATININE 0.79 0.78 0.84    Estimated Creatinine Clearance: 123.5 ml/min (by C-G formula based on Cr of 0.84).   Medical History: Past Medical History  Diagnosis Date  . Diabetes mellitus   . GERD (gastroesophageal reflux disease)   . Asthma   . Hx MRSA infection     on face  . Gastroparesis   . Diabetic neuropathy   . Seizures     Medications:  Scheduled:  . acyclovir  640 mg Intravenous Q8H  . chlorhexidine  15 mL Mouth Rinse BID  . collagenase   Topical Daily  . doxycycline  100 mg Oral Q12H  . fluconazole (DIFLUCAN) IV  100 mg Intravenous Q24H  . insulin aspart  0-20 Units Subcutaneous 6 times per day  . insulin glargine  22 Units Subcutaneous BID  . lidocaine  1 patch Transdermal Q24H  . loratadine  10 mg Oral Daily  . methylPREDNISolone (SOLU-MEDROL) injection  1,000 mg Intravenous Daily  . morphine  30 mg Oral Q12H  . sodium chloride  3 mL Intravenous Q12H   Infusions:  . sodium chloride 100 mL/hr at 02/01/14 1200    Assessment: 28 yo male will be started on lovenox for VTE prophylaxis. CrCl > 100 Goal of Therapy:  4hr LMWH level 0.3-0.5 Monitor platelets by anticoagulation protocol: Yes   Plan:  1) Start lovenox 40mg  sq q24h  Tsz-Yin Raelynne Ludwick 02/02/2014,8:01  PM

## 2014-02-02 NOTE — Progress Notes (Signed)
Physical Therapy Treatment Patient Details Name: Jason [name] MRN: [id number] DOB: [birth year] Today's Date: 02/02/2014    History of Present Illness pt presents weakness x 4 extremeties. Ruling out Transverse Myelitis.  Sepsis, DM, Bil LE Cellulitis, and Bil LE wounds from burning feet with slippers.      PT Comments    Pt progressing towards physical therapy goals. Was able to demonstrate independent sitting EOB for short amounts of time with positioning of UE's laterally for support. Pt reports increased pain in shoulders, and feel this may be due to muscular tension as pt states his pain decreases when he leans back against tech to relax his arms while sitting EOB. Will continue to follow per POC.   Follow Up Recommendations  CIR     Equipment Recommendations  Other (comment) (TBD)    Recommendations for Other Services Rehab consult     Precautions / Restrictions Precautions Precautions: Fall Precaution Comments: high risk for skin breakdown Required Braces or Orthoses: Other Brace/Splint Other Brace/Splint: B prevalon boots Restrictions Weight Bearing Restrictions: No    Mobility  Bed Mobility Overal bed mobility: Needs Assistance Bed Mobility: Supine to Sit   Sidelying to sit: Total assist Supine to sit: Total assist;+2 for physical assistance;HOB elevated Sit to supine: Total assist;+2 for physical assistance   General bed mobility comments: Assist for movement of LE's to EOB, as well as hand-over-hand assist to reach for bed rails. Able to lightly grasp the bed rails however unable to grip or use bed rail for support to pull himself up on.   Transfers Overall transfer level: Needs assistance Equipment used: 2 person hand held assist Transfers: Sit to/from [employer] Sit to Stand: Total assist;+2 physical assistance   Squat pivot transfers: Total assist;+2 physical assistance     General transfer comment: Blocking of bilateral knees as  well as bed pad use to sling hips while transferring bed to chair.   Ambulation/Gait                 Stairs            Wheelchair Mobility    Modified Rankin (Stroke Patients Only)       Balance Overall balance assessment: Needs assistance Sitting-balance support: Feet supported;Bilateral upper extremity supported Sitting balance-Leahy Scale: Zero Sitting balance - Comments: Sat EOB ~10 minutes and demonstrated short instances of independent sitting. Has difficulty engaging core to stabilize or return to upright position. VC's to use head to help him lean forward or backward.  Postural control: Posterior lean                          Cognition Arousal/Alertness: Awake/alert Behavior During Therapy: Flat affect Overall Cognitive Status: Impaired/Different from baseline Area of Impairment: Memory     Memory: Decreased short-term memory         General Comments: mother correcting patient and reporting to therapist the events pt states took place have not. ( pt reports taking bath this AM)    Exercises      General Comments        Pertinent Vitals/Pain Pt reports no DOE throughout session, but states he has pain in his abdomen and shoulder areas.    Home Living                      Prior Function            PT Goals (current goals  can now be found in the care plan section) Acute Rehab PT Goals Patient Stated Goal: to do more for myself PT Goal Formulation: With patient Time For Goal Achievement: 02/15/14 Potential to Achieve Goals: Good Progress towards PT goals: Progressing toward goals    Frequency  Min 4X/week    PT Plan Current plan remains appropriate    Co-evaluation             End of Session Equipment Utilized During Treatment: Gait belt Activity Tolerance: Patient limited by fatigue;Patient limited by lethargy Patient left: in chair;with call bell/phone within reach;with family/visitor present      Time: 5956-3875 PT Time Calculation (min): 46 min  Charges:  $Therapeutic Activity: 23-37 mins $Neuromuscular Re-education: 8-22 mins                    G Codes:      Jolyn Lent 2014-02-27, 4:52 PM  Jolyn Lent, PT, DPT Acute Rehabilitation Services Pager: [phone]

## 2014-02-02 NOTE — Progress Notes (Signed)
Occupational Therapy Treatment Patient Details Name: Jason [name] MRN: [id number] DOB: 11-12-1985 Today's Date: 02/02/2014    History of present illness pt presents weakness x 4 extremeties. Ruling out Transverse Myelitis.  Sepsis, DM, Bil LE Cellulitis, and Bil LE wounds from burning feet with slippers.     OT comments  Pt reporting pain at sacral and could benefit from mattress overlay to prevent skin break down. Please order if you agree. OT to continue to focus on : static sitting balance, adl retraining, and activity tolerance.    Follow Up Recommendations  CIR;Supervision/Assistance - 24 hour    Equipment Recommendations  3 in 1 bedside comode;Tub/shower bench;Wheelchair (measurements OT);Wheelchair cushion (measurements OT);Hospital bed    Recommendations for Other Services Rehab consult    Precautions / Restrictions Precautions Precautions: Fall;Other (comment) Precaution Comments: high risk for skin breakdown Required Braces or Orthoses: Other Brace/Splint Other Brace/Splint: B prevalon boots       Mobility Bed Mobility Overal bed mobility: Needs Assistance Bed Mobility: Supine to Sit   Sidelying to sit: Total assist Supine to sit: Total assist;+2 for physical assistance Sit to supine: Total assist;+2 for physical assistance   General bed mobility comments: Pt requires total (A) to progress bil LE to EOB  Transfers                 General transfer comment: not assessed at this time. PT arriving and to transfer    Balance   Sitting-balance support: Bilateral upper extremity supported;Feet supported Sitting balance-Leahy Scale: Zero Sitting balance - Comments: Pt using bil UE to prop sit with elbow full extension and posteriorly extended                           ADL Overall ADL's : Needs assistance/impaired Eating/Feeding: Maximal assistance;Bed level;With adaptive utensils Eating/Feeding Details (indicate cue type and reason): pt  using Lt hand to self feed grits with built up red handled spoon Grooming: Total assistance;Sitting Grooming Details (indicate cue type and reason): wash face Upper Body Bathing: Total assistance;Bed level Upper Body Bathing Details (indicate cue type and reason): pt able to AROM shoulder flexion for underarm bathing and deordorant Lower Body Bathing: Total assistance;Bed level   Upper Body Dressing : Total assistance                     General ADL Comments: Pt supine on arrival crying out into hallway due to buttock pain. OT arriving and immediately log rolling patient onto left side. Pt reports immediate relief. Pt could benefit from air mattress. Pt is high risk for skin break down. pt noted to have wounds on bil anterior aspect of shin and bil big toes. Pt log rolled Rt and left for peri care and change linens. Pt progressed to EOB for static sitting. pt reports pain and returned to supine with pillow under left hip. Pt reports much improved positioning. pt with HOB elevated for self feeding. Mother present and discussing mothers personal disabilities and inability to (A) patient at this level.      Vision                     Perception     Praxis      Cognition   Behavior During Therapy: Flat affect Overall Cognitive Status: Impaired/Different from baseline Area of Impairment: Memory     Memory: Decreased short-term memory  General Comments: mother correcting patient and reporting to therapist the events pt states took place have not. ( pt reports taking bath this AM)    Extremity/Trunk Assessment               Exercises     Shoulder Instructions       General Comments      Pertinent Vitals/ Pain       Pain resolved with repositioning.  Home Living                                          Prior Functioning/Environment              Frequency Min 3X/week     Progress Toward Goals  OT Goals(current goals  can now be found in the care plan section)  Progress towards OT goals: Progressing toward goals  Acute Rehab OT Goals Patient Stated Goal: to do more for myself OT Goal Formulation: With patient Time For Goal Achievement: 02/15/14 Potential to Achieve Goals: Good ADL Goals Pt Will Perform Eating: with min assist;with assist to don/doff brace/orthosis;with adaptive utensils;sitting Pt Will Perform Grooming: with min assist;sitting;with adaptive equipment Pt/caregiver will Perform Home Exercise Program: Increased strength;Both right and left upper extremity;With theraputty;With written HEP provided Additional ADL Goal #1: Maintain sitting balance EOB x 10 min with min A for ADL Additional ADL Goal #2: Demonstrate funcitonal pinch pattern R/L hand using tenodesis/compensatory technique to increase independence with ADL  Plan Discharge plan remains appropriate    Co-evaluation                 End of Session     Activity Tolerance Patient tolerated treatment well   Patient Left in bed;with call bell/phone within reach   Nurse Communication Mobility status;Precautions        Time: 8502-7741 OT Time Calculation (min): 26 min  Charges:    Peri Maris 02/02/2014, 1:54 PM Pager: [phone]

## 2014-02-02 NOTE — Progress Notes (Signed)
ANTIBIOTIC CONSULT NOTE - FOLLOW UP  Pharmacy Consult for vanomcyin Indication: cellulitis, r/o PNA, and r/o infectious causes of transverse myelitis   Allergies  Allergen Reactions  . Cefuroxime Axetil Anaphylaxis  . Penicillins Anaphylaxis    ?can take amoxicillin?  Jason [name] [Benzonatate] Anaphylaxis  . Shellfish Allergy Itching    Patient Measurements: Height: 5' 7.5" (171.5 cm) Weight: 149 lb 4 oz (67.7 kg) IBW/kg (Calculated) : 67.25 Adjusted Body Weight:   Vital Signs: Temp: 98.2 F (36.8 C) (04/28 1334) Temp src: Oral (04/28 1334) BP: 132/82 mmHg (04/28 1334) Pulse Rate: 76 (04/28 1334) Intake/Output from previous day: 04/27 0701 - 04/28 0700 In: 3732.6 [P.O.:400; I.V.:2203; IV Piggyback:1129.6] Out: 1065 [Urine:1065] Intake/Output from this shift: Total I/O In: 240 [P.O.:240] Out: 300 [Urine:300]  Labs:  Recent Labs  01/31/14 0250 02/01/14 0305 02/02/14 0309  WBC 33.5* 38.0* 49.7*  HGB 8.0* 8.6* 9.1*  PLT 447* 466* 559*  CREATININE 0.79 0.78 0.84   Estimated Creatinine Clearance: 123.5 ml/min (by C-G formula based on Cr of 0.84). No results found for this basename: VANCOTROUGH, VANCOPEAK, VANCORANDOM, [place], [place], GENTRANDOM, [place], TOBRAPEAK, TOBRARND, AMIKACINPEAK, AMIKACINTROU, AMIKACIN,  in the last 72 hours   Microbiology: Recent Results (from the past 720 hour(s))  CULTURE, BLOOD (ROUTINE X 2)     Status: None   Collection Time    01/29/14  9:15 AM      Result Value Ref Range Status   Specimen Description BLOOD LEFT ARM   Final   Special Requests BOTTLES DRAWN AEROBIC AND ANAEROBIC 5CC EACH   Final   Culture  Setup Time     Final   Value: 01/29/2014 14:12     Performed at [facility]   Culture     Final   Value:        BLOOD CULTURE RECEIVED NO GROWTH TO DATE CULTURE WILL BE HELD FOR 5 DAYS BEFORE ISSUING A FINAL NEGATIVE REPORT     Performed at [facility]   Report Status PENDING   Incomplete  URINE  CULTURE     Status: None   Collection Time    01/29/14  9:28 AM      Result Value Ref Range Status   Specimen Description URINE, CATHETERIZED   Final   Special Requests NONE   Final   Culture  Setup Time     Final   Value: 01/29/2014 14:22     Performed at [place]     Final   Value: NO GROWTH     Performed at [facility]   Culture     Final   Value: NO GROWTH     Performed at [facility]   Report Status 01/30/2014 FINAL   Final  CULTURE, BLOOD (ROUTINE X 2)     Status: None   Collection Time    01/29/14  9:35 AM      Result Value Ref Range Status   Specimen Description BLOOD RIGHT ARM   Final   Special Requests BOTTLES DRAWN AEROBIC AND ANAEROBIC 5CC BOTH   Final   Culture  Setup Time     Final   Value: 01/29/2014 14:12     Performed at [facility]   Culture     Final   Value:        BLOOD CULTURE RECEIVED NO GROWTH TO DATE CULTURE WILL BE HELD FOR 5 DAYS BEFORE ISSUING A FINAL NEGATIVE REPORT  Performed at [facility]   Report Status PENDING   Incomplete  MRSA PCR SCREENING     Status: None   Collection Time    01/30/14 11:59 AM      Result Value Ref Range Status   MRSA by PCR NEGATIVE  NEGATIVE Final   Comment:            The GeneXpert MRSA Assay (FDA     approved for NASAL specimens     only), is one component of a     comprehensive MRSA colonization     surveillance program. It is not     intended to diagnose MRSA     infection nor to guide or     monitor treatment for     MRSA infections.  GC/CHLAMYDIA PROBE AMP     Status: None   Collection Time    01/30/14  1:35 PM      Result Value Ref Range Status   CT Probe RNA NEGATIVE  NEGATIVE Final   GC Probe RNA NEGATIVE  NEGATIVE Final   Comment: (NOTE)                                                                                               **Normal Reference Range: Negative**          Assay performed using the Gen-Probe APTIMA COMBO2 (R)  Assay.     Acceptable specimen types for this assay include APTIMA Swabs (Unisex,     endocervical, urethral, or vaginal), first void urine, and ThinPrep     liquid based cytology samples.     Performed at [place]     Status: None   Collection Time    01/30/14 10:45 PM      Result Value Ref Range Status   Specimen Description CSF   Final   Special Requests NO4 [facility]   Final   Gram Stain     Final   Value: WBC PRESENT,BOTH PMN AND MONONUCLEAR     NO ORGANISMS SEEN     CYTOSPIN SLIDE   Report Status 01/30/2014 FINAL   Final  CSF CULTURE     Status: None   Collection Time    01/30/14 10:45 PM      Result Value Ref Range Status   Specimen Description CSF   Final   Special Requests CSF NO4 [place]   Final   Gram Stain     Final   Value: CYTOSPIN SLIDE WBC PRESENT,BOTH PMN AND MONONUCLEAR     NO ORGANISMS SEEN     Performed at [facility]     Performed at [facility]   Culture     Final   Value: NO GROWTH 1 DAY     Performed at [facility]   Report Status PENDING   Incomplete  FUNGAL STAIN     Status: None   Collection Time    01/30/14 10:45 PM      Result Value Ref Range Status   Specimen Description CSF   Final   Special Requests CSF NO4  [facility]   Final   Fungal Smear     Final   Value: NO YEAST OR FUNGAL ELEMENTS SEEN     Performed at [facility]   Report Status 01/31/2014 FINAL   Final    Anti-infectives   Start     Dose/Rate Route Frequency Ordered Stop   01/31/14 1800  vancomycin (VANCOCIN) IVPB 1000 mg/200 mL premix     1,000 mg 200 mL/hr over 60 Minutes Intravenous Every 8 hours 01/31/14 1233     01/30/14 1600  acyclovir (ZOVIRAX) 640 mg in dextrose 5 % 100 mL IVPB     640 mg 112.8 mL/hr over 60 Minutes Intravenous Every 8 hours 01/30/14 1431     01/30/14 1300  levofloxacin (LEVAQUIN) IVPB 500 mg  Status:  Discontinued     500 mg 100 mL/hr over 60 Minutes Intravenous Every 24 hours 01/30/14 1248 02/01/14 1418    01/30/14 1245  fluconazole (DIFLUCAN) IVPB 100 mg     100 mg 50 mL/hr over 60 Minutes Intravenous Every 24 hours 01/30/14 1248     01/29/14 2200  vancomycin (VANCOCIN) 500 mg in sodium chloride 0.9 % 100 mL IVPB  Status:  Discontinued     500 mg 100 mL/hr over 60 Minutes Intravenous Every 12 hours 01/29/14 0952 01/29/14 2022   01/29/14 2200  vancomycin (VANCOCIN) 500 mg in sodium chloride 0.9 % 100 mL IVPB  Status:  Discontinued     500 mg 100 mL/hr over 60 Minutes Intravenous Every 12 hours 01/29/14 2034 01/31/14 1233   01/29/14 2100  aztreonam (AZACTAM) 1 g in dextrose 5 % 50 mL IVPB  Status:  Discontinued     1 g 100 mL/hr over 30 Minutes Intravenous 3 times per day 01/29/14 2031 02/01/14 1418   01/29/14 1000  vancomycin (VANCOCIN) IVPB 1000 mg/200 mL premix     1,000 mg 200 mL/hr over 60 Minutes Intravenous STAT 01/29/14 0952 01/29/14 1237   01/29/14 0945  aztreonam (AZACTAM) 2 g in dextrose 5 % 50 mL IVPB  Status:  Discontinued     2 g 100 mL/hr over 30 Minutes Intravenous  Once 01/29/14 5427 01/29/14 2022      Assessment: 28 yo male with cellulitis, r/o PNA, and r/o infectious causes of transverse myelitis is currently on supratherapeutic vancomycin.  Vancomycin trough was 25.3.  ID has now changed antibiotic to doxycycline.   Goal of Therapy:  Vancomycin trough 15-20 mcg/mL  Plan:  1) Vancomycin has been d/c'ed. Will monitor renal function  Tsz-Yin Darris Carachure 02/02/2014,3:49 PM

## 2014-02-02 NOTE — Progress Notes (Signed)
Jason [name] for Infectious Disease   Day # 5 vancomycin, aztreonam, acyclovir,  Day # 5 fluconazole  Subjective: No new complaints   Antibiotics:  Anti-infectives   Start     Dose/Rate Route Frequency Ordered Stop   01/31/14 1800  vancomycin (VANCOCIN) IVPB 1000 mg/200 mL premix     1,000 mg 200 mL/hr over 60 Minutes Intravenous Every 8 hours 01/31/14 1233     01/30/14 1600  acyclovir (ZOVIRAX) 640 mg in dextrose 5 % 100 mL IVPB     640 mg 112.8 mL/hr over 60 Minutes Intravenous Every 8 hours 01/30/14 1431     01/30/14 1300  levofloxacin (LEVAQUIN) IVPB 500 mg  Status:  Discontinued     500 mg 100 mL/hr over 60 Minutes Intravenous Every 24 hours 01/30/14 1248 02/01/14 1418   01/30/14 1245  fluconazole (DIFLUCAN) IVPB 100 mg     100 mg 50 mL/hr over 60 Minutes Intravenous Every 24 hours 01/30/14 1248     01/29/14 2200  vancomycin (VANCOCIN) 500 mg in sodium chloride 0.9 % 100 mL IVPB  Status:  Discontinued     500 mg 100 mL/hr over 60 Minutes Intravenous Every 12 hours 01/29/14 0952 01/29/14 2022   01/29/14 2200  vancomycin (VANCOCIN) 500 mg in sodium chloride 0.9 % 100 mL IVPB  Status:  Discontinued     500 mg 100 mL/hr over 60 Minutes Intravenous Every 12 hours 01/29/14 2034 01/31/14 1233   01/29/14 2100  aztreonam (AZACTAM) 1 g in dextrose 5 % 50 mL IVPB  Status:  Discontinued     1 g 100 mL/hr over 30 Minutes Intravenous 3 times per day 01/29/14 2031 02/01/14 1418   01/29/14 1000  vancomycin (VANCOCIN) IVPB 1000 mg/200 mL premix     1,000 mg 200 mL/hr over 60 Minutes Intravenous STAT 01/29/14 0952 01/29/14 1237   01/29/14 0945  aztreonam (AZACTAM) 2 g in dextrose 5 % 50 mL IVPB  Status:  Discontinued     2 g 100 mL/hr over 30 Minutes Intravenous  Once 01/29/14 0939 01/29/14 2022      Medications: Scheduled Meds: . acyclovir  640 mg Intravenous Q8H  . chlorhexidine  15 mL Mouth Rinse BID  . collagenase   Topical Daily  . fluconazole (DIFLUCAN) IV  100 mg  Intravenous Q24H  . insulin aspart  0-20 Units Subcutaneous 6 times per day  . insulin glargine  22 Units Subcutaneous BID  . lidocaine  1 patch Transdermal Q24H  . loratadine  10 mg Oral Daily  . LORazepam  1-2 mg Intravenous Once  . methylPREDNISolone (SOLU-MEDROL) injection  1,000 mg Intravenous Daily  . morphine  30 mg Oral Q12H  . sodium chloride  3 mL Intravenous Q12H  . vancomycin  1,000 mg Intravenous Q8H   Continuous Infusions: . sodium chloride 100 mL/hr at 02/01/14 1200   PRN Meds:.acetaminophen, acetaminophen, methocarbamol (ROBAXIN) IV, metoCLOPramide, ondansetron (ZOFRAN) IV, ondansetron, oxyCODONE, polyethylene glycol    Objective: Weight change: -1 lb 15.8 oz (-0.9 kg)  Intake/Output Summary (Last 24 hours) at 02/02/14 1707 Last data filed at 02/02/14 1450  Gross per 24 hour  Intake 3122.6 ml  Output    900 ml  Net 2222.6 ml   Blood pressure 132/82, pulse 76, temperature 98.2 F (36.8 C), temperature source Oral, resp. rate 10, height 5' 7.5" (1.715 m), weight 149 lb 4 oz (67.7 kg), SpO2 100.00%. Temp:  [97.9 F (36.6 C)-98.6 F (37 C)] 98.2 F (36.8 C) (  04/28 1334) Pulse Rate:  [76-83] 76 (04/28 1334) Resp:  [10-14] 10 (04/28 1334) BP: (114-138)/(73-85) 132/82 mmHg (04/28 1334) SpO2:  [100 %] 100 % (04/28 1334) Weight:  [149 lb 4 oz (67.7 kg)] 149 lb 4 oz (67.7 kg) (04/28 0500)  Physical Exam: General: Alert and awake, oriented x3, not in any acute distress. HEENT: anicteric sclera, pupils reactive to light and accommodation, EOMI CVS regular rate, normal r,  no murmur rubs or gallops Chest: clear to auscultation bilaterally, no wheezing, rales or rhonchi Abdomen: nondistended, normal bowel sounds, Neuro: bilateral LE paryalysis  Skin: ulcers not examined today     CBC:  Recent Labs Lab 01/29/14 0858 01/29/14 2113 01/30/14 0428 01/31/14 0250 02/01/14 0305 02/02/14 0309  HGB 9.4*  --  8.4* 8.0* 8.6* 9.1*  HCT 28.1*  --  25.6* 24.7* 26.5*  27.6*  PLT 462*  --  434* 447* 466* 559*  INR  --  1.12  --   --   --   --      BMET  Recent Labs  02/01/14 0305 02/02/14 0309  NA 132* 136*  K 3.8 4.0  CL 98 101  CO2 22 24  GLUCOSE 246* 176*  BUN 11 14  CREATININE 0.78 0.84  CALCIUM 8.2* 8.3*     Liver Panel   Recent Labs  02/01/14 0305 02/02/14 0309  PROT 6.9 6.8  ALBUMIN 1.4* 1.4*  AST 7 11  ALT 5 7  ALKPHOS 152* 495*  BILITOT 0.3 0.2*       Sedimentation Rate No results found for this basename: ESRSEDRATE,  in the last 72 hours C-Reactive Protein No results found for this basename: CRP,  in the last 72 hours  Micro Results: Recent Results (from the past 240 hour(s))  CULTURE, BLOOD (ROUTINE X 2)     Status: None   Collection Time    01/29/14  9:15 AM      Result Value Ref Range Status   Specimen Description BLOOD LEFT ARM   Final   Special Requests BOTTLES DRAWN AEROBIC AND ANAEROBIC 5CC EACH   Final   Culture  Setup Time     Final   Value: 01/29/2014 14:12     Performed at [facility]   Culture     Final   Value:        BLOOD CULTURE RECEIVED NO GROWTH TO DATE CULTURE WILL BE HELD FOR 5 DAYS BEFORE ISSUING A FINAL NEGATIVE REPORT     Performed at [facility]   Report Status PENDING   Incomplete  URINE CULTURE     Status: None   Collection Time    01/29/14  9:28 AM      Result Value Ref Range Status   Specimen Description URINE, CATHETERIZED   Final   Special Requests NONE   Final   Culture  Setup Time     Final   Value: 01/29/2014 14:22     Performed at [place]     Final   Value: NO GROWTH     Performed at [facility]   Culture     Final   Value: NO GROWTH     Performed at [facility]   Report Status 01/30/2014 FINAL   Final  CULTURE, BLOOD (ROUTINE X 2)     Status: None   Collection Time    01/29/14  9:35 AM      Result Value Ref Range  Status   Specimen Description BLOOD RIGHT ARM   Final   Special Requests BOTTLES  DRAWN AEROBIC AND ANAEROBIC 5CC BOTH   Final   Culture  Setup Time     Final   Value: 01/29/2014 14:12     Performed at [facility]   Culture     Final   Value:        BLOOD CULTURE RECEIVED NO GROWTH TO DATE CULTURE WILL BE HELD FOR 5 DAYS BEFORE ISSUING A FINAL NEGATIVE REPORT     Performed at [facility]   Report Status PENDING   Incomplete  MRSA PCR SCREENING     Status: None   Collection Time    01/30/14 11:59 AM      Result Value Ref Range Status   MRSA by PCR NEGATIVE  NEGATIVE Final   Comment:            The GeneXpert MRSA Assay (FDA     approved for NASAL specimens     only), is one component of a     comprehensive MRSA colonization     surveillance program. It is not     intended to diagnose MRSA     infection nor to guide or     monitor treatment for     MRSA infections.  GC/CHLAMYDIA PROBE AMP     Status: None   Collection Time    01/30/14  1:35 PM      Result Value Ref Range Status   CT Probe RNA NEGATIVE  NEGATIVE Final   GC Probe RNA NEGATIVE  NEGATIVE Final   Comment: (NOTE)                                                                                               **Normal Reference Range: Negative**          Assay performed using the Gen-Probe APTIMA COMBO2 (R) Assay.     Acceptable specimen types for this assay include APTIMA Swabs (Unisex,     endocervical, urethral, or vaginal), first void urine, and ThinPrep     liquid based cytology samples.     Performed at [place]     Status: None   Collection Time    01/30/14 10:45 PM      Result Value Ref Range Status   Specimen Description CSF   Final   Special Requests NO4 [facility]   Final   Gram Stain     Final   Value: WBC PRESENT,BOTH PMN AND MONONUCLEAR     NO ORGANISMS SEEN     CYTOSPIN SLIDE   Report Status 01/30/2014 FINAL   Final  CSF CULTURE     Status: None   Collection Time    01/30/14 10:45 PM      Result Value Ref Range Status   Specimen Description  CSF   Final   Special Requests CSF NO4 [place]   Final   Gram Stain     Final   Value: CYTOSPIN SLIDE WBC PRESENT,BOTH PMN AND MONONUCLEAR     NO ORGANISMS SEEN  Performed at [facility]     Performed at [facility]   Culture     Final   Value: NO GROWTH 1 DAY     Performed at [facility]   Report Status PENDING   Incomplete  FUNGAL STAIN     Status: None   Collection Time    01/30/14 10:45 PM      Result Value Ref Range Status   Specimen Description CSF   Final   Special Requests CSF NO4 [facility]   Final   Fungal Smear     Final   Value: NO YEAST OR FUNGAL ELEMENTS SEEN     Performed at [facility]   Report Status 01/31/2014 FINAL   Final    Studies/Results: No results found.    Assessment/Plan:  Principal Problem:   HCAP (healthcare-associated pneumonia) Active Problems:   Type 1 diabetes, uncontrolled, with neuropathy   GERD   Painful diabetic neuropathy   Chronic low back pain   Gastroparesis due to DM   Protein-calorie malnutrition, severe   Leg weakness   Bilateral lower leg cellulitis   Altered mental status   Transverse myelitis    Jason [name] is a 28 y.o. male with  DM, peripheral neuropathy with poor sensation in feet sp burn to feet with heating pad  use or worn at night with ulcerations of the care for the wound care by [facility] Companion. His had debridement of these ulcers and been on various courses of antibiotics. 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  #1 Transverse myelitis: No clear-cut relationship to any specific infectious disease I doubt there is any active infectious disease driving this process. --I. agree with IV corticosteroids and other immunomodulatory therapy per neurology  --Reasonable to continue acyclovir until VZV PCR is negative, (HSV 1 and 2 are negative) As soon as it is negative I will dc the  acyclovir   #2 Lower extremity Diabetic foot ulcers: HgA1c = 12.4  Plain films do not show osteomyelitis but ESR has been persistently high since December and ulcers have not resolved in at least 4 months.  I proposed MRI imaging but he did not wish to proceed with that at this time  He should have  ABIs at some course during his stay  Hopefully pressure off loading and wound care and eventually better glycemic control will improve these. If not it is fairly inevitable that they will progress deeper to DF Osteomyelitis which could  require amputations of digits, and more proximal morbid amputations  I will de-escalate his abx to doxycyline for now  He should continue on the doxy x 30 days and followup with wound care clinic and with me in my clinic in next 1-2 months re his DFI  Check A1c, and try to optimize glycemic control now complicated by his need for corticosteroids.   #3 screening: HIV and syphilis tests have been negative we'll send a hepatitis panel  negative   I will followup the results of his VZV PCR but will otherwise sign off for now  I will arrange for HSFU in my clinic in the next 1-2 months      LOS: 4 days   Truman Hayward 02/02/2014, 5:07 PM

## 2014-02-02 NOTE — Progress Notes (Signed)
SLP Cancellation Note  Patient Details Name: Jason [name] MRN: [id number] DOB: 04/27/1986   Cancelled treatment:       Reason Eval/Treat Not Completed: SLP screened, no needs identified, will sign off. Pts orders written as part of the stroke order set. Stroke has been ruled out. No documentation of speech/language or cognitive deficits. Will cancel order. Please reorder if needed.    Jason [name] 02/02/2014, 11:24 AM
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
## 2014-02-02 NOTE — Progress Notes (Signed)
NEURO HOSPITALIST PROGRESS NOTE   SUBJECTIVE:                                                                                                                        Neurological status unchanged, although he thinks that he getting " more feeling in my legs". High dose solumedrol 3/5 days. Follow up MRI cervical and thoracic spine pending. PT help appreciated. OBJECTIVE:                                                                                                                           Vital signs in last 24 hours: Temp:  [97.9 F (36.6 C)-98.6 F (37 C)] 98.4 F (36.9 C) (04/28 1705) Pulse Rate:  [76-83] 78 (04/28 1705) Resp:  [10-14] 10 (04/28 1705) BP: (114-138)/(73-87) 133/87 mmHg (04/28 1705) SpO2:  [100 %] 100 % (04/28 1705) Weight:  [67.7 kg (149 lb 4 oz)] 67.7 kg (149 lb 4 oz) (04/28 0500)  Intake/Output from previous day: 04/27 0701 - 04/28 0700 In: 3732.6 [P.O.:400; I.V.:2203; IV Piggyback:1129.6] Out: 1065 [Urine:1065] Intake/Output this shift: Total I/O In: 240 [P.O.:240] Out: 300 [Urine:300] Nutritional status: Carb Control  Past Medical History  Diagnosis Date  . Diabetes mellitus   . GERD (gastroesophageal reflux disease)   . Asthma   . Hx MRSA infection     on face  . Gastroparesis   . Diabetic neuropathy   . Seizures     Neurologic Exam:  Alert, awake, oriented x 4. Comprehension, naming, and repetition intact. Does follow complex commands without difficulty  Cranial Nerves:  II: Discs flat bilaterally; pupils equal, round, reactive to light and accommodation, visual fields intact.  III,IV, VI: ptosis not present, extra-ocular motions intact bilaterally  V,VII: grimace symmetric  VIII: hearing unable to be tested  IX,X: gag reflex present  XI: bilateral shoulder shrug  XII: tongue extension not able to be tested  Motor:  Moves upper extremities spontaneously and has normal tone in the upper extremities,  but seems to have weakness triceps and wrist extensors bilaterally  Left Lower extremity 0/5 Right Lower extremity 0/5  Flaccid in the lower extremities  Sensory: Appreciates pain in all extremities but unable to determine a sensory level.  Deep  Tendon Reflexes: 2+ in the upper extremities and absent in the lower extremities  Plantars:  Unable to test due to sores.  Cerebellar:  Unable to test  Gait: Unable to test   Lab Results: No results found for this basename: cbc, bmp, coags, chol, tri, ldl, hga1c   Lipid Panel No results found for this basename: CHOL, TRIG, HDL, CHOLHDL, VLDL, LDLCALC,  in the last 72 hours  Studies/Results: No results found.  MEDICATIONS                                                                                                                       I have reviewed the patient's current medications.  ASSESSMENT/PLAN:                                                                                                           28 y/o with a neurological and neuroimaging picture suggestive of transverse myelitis. However, CSF is not consistent with infection. Has bilateral LE cellulitis.  Except for elevated proteins, the CSF is unimpressive so far.  Plan to continue high dose IV methylprednisolone x 5 days and repeat MRI cervical and thoracic spine with and without contrast.  Will follow up.  Dorian Pod, MD [facility] [phone]  02/02/2014, 5:42 PM

## 2014-02-02 NOTE — Progress Notes (Signed)
Vancomycin trough 25; vancomycin has been dced.

## 2014-02-03 DIAGNOSIS — G8220 Paraplegia, unspecified: Secondary | ICD-10-CM

## 2014-02-03 DIAGNOSIS — G0489 Other myelitis: Secondary | ICD-10-CM

## 2014-02-03 DIAGNOSIS — E1149 Type 2 diabetes mellitus with other diabetic neurological complication: Secondary | ICD-10-CM

## 2014-02-03 LAB — GLUCOSE, CAPILLARY
GLUCOSE-CAPILLARY: 160 mg/dL — AB (ref 70–99)
GLUCOSE-CAPILLARY: 166 mg/dL — AB (ref 70–99)
Glucose-Capillary: 247 mg/dL — ABNORMAL HIGH (ref 70–99)
Glucose-Capillary: 150 mg/dL — ABNORMAL HIGH (ref 70–99)
Glucose-Capillary: 158 mg/dL — ABNORMAL HIGH (ref 70–99)

## 2014-02-03 LAB — CSF CULTURE W GRAM STAIN

## 2014-02-03 LAB — VDRL, CSF: VDRL Quant, CSF: NONREACTIVE

## 2014-02-03 LAB — VARICELLA-ZOSTER BY PCR: Varicella-Zoster, PCR: NOT DETECTED

## 2014-02-03 LAB — CSF CULTURE: CULTURE: NO GROWTH

## 2014-02-03 MED ORDER — INSULIN GLARGINE 100 UNIT/ML ~~LOC~~ SOLN
26.0000 [IU] | Freq: Two times a day (BID) | SUBCUTANEOUS | Status: DC
  Administered 2014-02-03 – 2014-02-04 (×3): 26 [IU] via SUBCUTANEOUS
  Filled 2014-02-03 (×5): qty 0.26

## 2014-02-03 MED ORDER — INSULIN ASPART 100 UNIT/ML ~~LOC~~ SOLN: 3.0000 [IU] | Freq: Three times a day (TID) | SUBCUTANEOUS | Status: DC

## 2014-02-03 MED ORDER — INSULIN ASPART 100 UNIT/ML ~~LOC~~ SOLN
0.0000 [IU] | Freq: Three times a day (TID) | SUBCUTANEOUS | Status: DC
Start: 2014-02-03 — End: 2014-02-05
  Administered 2014-02-03 (×2): 4 [IU] via SUBCUTANEOUS
  Administered 2014-02-04: 3 [IU] via SUBCUTANEOUS

## 2014-02-03 MED ORDER — FLUCONAZOLE 100 MG PO TABS
100.0000 mg | ORAL_TABLET | Freq: Every day | ORAL | Status: DC
  Administered 2014-02-03 – 2014-02-05 (×3): 100 mg via ORAL
  Filled 2014-02-03 (×3): qty 1

## 2014-02-03 MED ORDER — OXYCODONE HCL 5 MG PO TABS
5.0000 mg | ORAL_TABLET | Freq: Three times a day (TID) | ORAL | Status: DC | PRN
  Administered 2014-02-05: 5 mg via ORAL
  Filled 2014-02-03: qty 1

## 2014-02-03 MED ORDER — OXYCODONE HCL 5 MG PO TABS
5.0000 mg | ORAL_TABLET | ORAL | Status: DC | PRN
  Administered 2014-02-03: 10 mg via ORAL
  Filled 2014-02-03: qty 2

## 2014-02-03 NOTE — Progress Notes (Signed)
Inpatient Diabetes Program Recommendations  AACE/ADA: New Consensus Statement on Inpatient Glycemic Control (2013)  Target Ranges:  Prepandial:   less than 140 mg/dL      Peak postprandial:   less than 180 mg/dL (1-2 hours)      Critically ill patients:  140 - 180 mg/dL  Results for EGE, MUCKEY (MRN [id number]) as of 02/03/2014 09:28  Ref. Range 02/02/2014 07:44 02/02/2014 12:57 02/02/2014 17:12 02/02/2014 21:04 02/02/2014 23:58 02/03/2014 04:30 02/03/2014 08:00  Glucose-Capillary Latest Range: 70-99 mg/dL 148 (H) 189 (H) 224 (H) 257 (H) 310 (H) 247 (H) 160 (H)   Diabetes history: DM Outpatient Diabetes medications: Lantus 20 units daily, Novolog 1-17 units QID Current orders for Inpatient glycemic control: Lantus 22 units BID, Novolog 0-20 units Q4H  Inpatient Diabetes Program Recommendations Insulin - Meal Coverage: Post prandial glucose consistently elevated. Please consider ordering Novolog 6 units TID with meals for meal coverage.  Thanks, Barnie Alderman, RN, MSN, CCRN Diabetes Coordinator Inpatient Diabetes Program [phone] (Team Pager) [phone] ([facility] office) [phone] [facility] office)

## 2014-02-03 NOTE — Progress Notes (Signed)
[facility]    Day # 2 doxycyline  Day # 6 fluconazole  Subjective: No new complaints   Antibiotics:  Anti-infectives   Start     Dose/Rate Route Frequency Ordered Stop   02/03/14 1200  fluconazole (DIFLUCAN) tablet 100 mg     100 mg Oral Daily 02/03/14 1109     02/02/14 2200  doxycycline (VIBRA-TABS) tablet 100 mg     100 mg Oral Every 12 hours 02/02/14 1717     01/31/14 1800  vancomycin (VANCOCIN) IVPB 1000 mg/200 mL premix  Status:  Discontinued     1,000 mg 200 mL/hr over 60 Minutes Intravenous Every 8 hours 01/31/14 1233 02/02/14 1717   01/30/14 1600  acyclovir (ZOVIRAX) 640 mg in dextrose 5 % 100 mL IVPB  Status:  Discontinued     640 mg 112.8 mL/hr over 60 Minutes Intravenous Every 8 hours 01/30/14 1431 02/03/14 1017   01/30/14 1300  levofloxacin (LEVAQUIN) IVPB 500 mg  Status:  Discontinued     500 mg 100 mL/hr over 60 Minutes Intravenous Every 24 hours 01/30/14 1248 02/01/14 1418   01/30/14 1245  fluconazole (DIFLUCAN) IVPB 100 mg  Status:  Discontinued     100 mg 50 mL/hr over 60 Minutes Intravenous Every 24 hours 01/30/14 1248 02/03/14 1109   01/29/14 2200  vancomycin (VANCOCIN) 500 mg in sodium chloride 0.9 % 100 mL IVPB  Status:  Discontinued     500 mg 100 mL/hr over 60 Minutes Intravenous Every 12 hours 01/29/14 0952 01/29/14 2022   01/29/14 2200  vancomycin (VANCOCIN) 500 mg in sodium chloride 0.9 % 100 mL IVPB  Status:  Discontinued     500 mg 100 mL/hr over 60 Minutes Intravenous Every 12 hours 01/29/14 2034 01/31/14 1233   01/29/14 2100  aztreonam (AZACTAM) 1 g in dextrose 5 % 50 mL IVPB  Status:  Discontinued     1 g 100 mL/hr over 30 Minutes Intravenous 3 times per day 01/29/14 2031 02/01/14 1418   01/29/14 1000  vancomycin (VANCOCIN) IVPB 1000 mg/200 mL premix     1,000 mg 200 mL/hr over 60 Minutes Intravenous STAT 01/29/14 0952 01/29/14 1237   01/29/14 0945  aztreonam (AZACTAM) 2 g in dextrose 5 % 50 mL IVPB  Status:   Discontinued     2 g 100 mL/hr over 30 Minutes Intravenous  Once 01/29/14 0939 01/29/14 2022      Medications: Scheduled Meds: . chlorhexidine  15 mL Mouth Rinse BID  . collagenase   Topical Daily  . doxycycline  100 mg Oral Q12H  . enoxaparin (LOVENOX) injection  40 mg Subcutaneous Q24H  . fluconazole  100 mg Oral Daily  . gabapentin  300 mg Oral QHS  . insulin aspart  0-20 Units Subcutaneous 6 times per day  . insulin glargine  22 Units Subcutaneous BID  . lidocaine  1 patch Transdermal Q24H  . loratadine  10 mg Oral Daily  . morphine  30 mg Oral Q12H  . sodium chloride  3 mL Intravenous Q12H   Continuous Infusions: . sodium chloride 100 mL/hr at 02/03/14 1216   PRN Meds:.acetaminophen, acetaminophen, methocarbamol (ROBAXIN) IV, metoCLOPramide, ondansetron (ZOFRAN) IV, ondansetron, oxyCODONE, polyethylene glycol    Objective: Weight change: 7.1 oz (0.2 kg)  Intake/Output Summary (Last 24 hours) at 02/03/14 1523 Last data filed at 02/03/14 1228  Gross per 24 hour  Intake   4153 ml  Output   1250 ml  Net  2903 ml   Blood pressure 140/88, pulse 90, temperature 98.3 F (36.8 C), temperature source Oral, resp. rate 8, height 5' 7.5" (1.715 m), weight 149 lb 11.1 oz (67.9 kg), SpO2 94.00%. Temp:  [98.2 F (36.8 C)-99.2 F (37.3 C)] 98.3 F (36.8 C) (04/29 1228) Pulse Rate:  [78-90] 90 (04/29 0400) Resp:  [8-16] 8 (04/29 0400) BP: (109-140)/(67-88) 140/88 mmHg (04/29 1228) SpO2:  [94 %-100 %] 94 % (04/29 1228) Weight:  [149 lb 11.1 oz (67.9 kg)] 149 lb 11.1 oz (67.9 kg) (04/29 0423)  Physical Exam: General: Alert and awake, oriented x3, not in any acute distress. HEENT: anicteric sclera, pupils reactive to light and accommodation, EOMI CVS regular rate, normal r,  no murmur rubs or gallops Chest: clear to auscultation bilaterally, no wheezing, rales or rhonchi Abdomen: nondistended, normal bowel sounds, Neuro: bilateral LE paryalysis  Skin: ulcers not examined  today     CBC:  Recent Labs Lab 01/29/14 0858 01/29/14 2113 01/30/14 0428 01/31/14 0250 02/01/14 0305 02/02/14 0309  HGB 9.4*  --  8.4* 8.0* 8.6* 9.1*  HCT 28.1*  --  25.6* 24.7* 26.5* 27.6*  PLT 462*  --  434* 447* 466* 559*  INR  --  1.12  --   --   --   --      BMET  Recent Labs  02/01/14 0305 02/02/14 0309  NA 132* 136*  K 3.8 4.0  CL 98 101  CO2 22 24  GLUCOSE 246* 176*  BUN 11 14  CREATININE 0.78 0.84  CALCIUM 8.2* 8.3*     Liver Panel   Recent Labs  02/01/14 0305 02/02/14 0309  PROT 6.9 6.8  ALBUMIN 1.4* 1.4*  AST 7 11  ALT 5 7  ALKPHOS 152* 495*  BILITOT 0.3 0.2*       Sedimentation Rate No results found for this basename: ESRSEDRATE,  in the last 72 hours C-Reactive Protein No results found for this basename: CRP,  in the last 72 hours  Micro Results: Recent Results (from the past 240 hour(s))  CULTURE, BLOOD (ROUTINE X 2)     Status: None   Collection Time    01/29/14  9:15 AM      Result Value Ref Range Status   Specimen Description BLOOD LEFT ARM   Final   Special Requests BOTTLES DRAWN AEROBIC AND ANAEROBIC 5CC EACH   Final   Culture  Setup Time     Final   Value: 01/29/2014 14:12     Performed at [facility]   Culture     Final   Value:        BLOOD CULTURE RECEIVED NO GROWTH TO DATE CULTURE WILL BE HELD FOR 5 DAYS BEFORE ISSUING A FINAL NEGATIVE REPORT     Performed at [facility]   Report Status PENDING   Incomplete  URINE CULTURE     Status: None   Collection Time    01/29/14  9:28 AM      Result Value Ref Range Status   Specimen Description URINE, CATHETERIZED   Final   Special Requests NONE   Final   Culture  Setup Time     Final   Value: 01/29/2014 14:22     Performed at [place]     Final   Value: NO GROWTH     Performed at [facility]   Culture     Final   Value: NO GROWTH  Performed at [facility]   Report Status 01/30/2014 FINAL   Final    CULTURE, BLOOD (ROUTINE X 2)     Status: None   Collection Time    01/29/14  9:35 AM      Result Value Ref Range Status   Specimen Description BLOOD RIGHT ARM   Final   Special Requests BOTTLES DRAWN AEROBIC AND ANAEROBIC 5CC BOTH   Final   Culture  Setup Time     Final   Value: 01/29/2014 14:12     Performed at [facility]   Culture     Final   Value:        BLOOD CULTURE RECEIVED NO GROWTH TO DATE CULTURE WILL BE HELD FOR 5 DAYS BEFORE ISSUING A FINAL NEGATIVE REPORT     Performed at [facility]   Report Status PENDING   Incomplete  MRSA PCR SCREENING     Status: None   Collection Time    01/30/14 11:59 AM      Result Value Ref Range Status   MRSA by PCR NEGATIVE  NEGATIVE Final   Comment:            The GeneXpert MRSA Assay (FDA     approved for NASAL specimens     only), is one component of a     comprehensive MRSA colonization     surveillance program. It is not     intended to diagnose MRSA     infection nor to guide or     monitor treatment for     MRSA infections.  GC/CHLAMYDIA PROBE AMP     Status: None   Collection Time    01/30/14  1:35 PM      Result Value Ref Range Status   CT Probe RNA NEGATIVE  NEGATIVE Final   GC Probe RNA NEGATIVE  NEGATIVE Final   Comment: (NOTE)                                                                                               **Normal Reference Range: Negative**          Assay performed using the Gen-Probe APTIMA COMBO2 (R) Assay.     Acceptable specimen types for this assay include APTIMA Swabs (Unisex,     endocervical, urethral, or vaginal), first void urine, and ThinPrep     liquid based cytology samples.     Performed at [facility]     Status: None   Collection Time    01/30/14 10:45 PM      Result Value Ref Range Status   Specimen Description CSF   Final   Special Requests NO4 [facility]   Final   Gram Stain     Final   Value: WBC PRESENT,BOTH PMN AND MONONUCLEAR     NO ORGANISMS  SEEN     CYTOSPIN SLIDE   Report Status 01/30/2014 FINAL   Final  CSF CULTURE     Status: None   Collection Time    01/30/14 10:45 PM      Result Value  Ref Range Status   Specimen Description CSF   Final   Special Requests CSF NO4 [place]   Final   Gram Stain     Final   Value: CYTOSPIN SLIDE WBC PRESENT,BOTH PMN AND MONONUCLEAR     NO ORGANISMS SEEN     Performed at [facility]     Performed at [facility]   Culture     Final   Value: NO GROWTH 3 DAYS     Performed at [facility]   Report Status 02/03/2014 FINAL   Final  FUNGAL STAIN     Status: None   Collection Time    01/30/14 10:45 PM      Result Value Ref Range Status   Specimen Description CSF   Final   Special Requests CSF NO4 [facility]   Final   Fungal Smear     Final   Value: NO YEAST OR FUNGAL ELEMENTS SEEN     Performed at [facility]   Report Status 01/31/2014 FINAL   Final    Studies/Results: No results found.    Assessment/Plan:  Principal Problem:   HCAP (healthcare-associated pneumonia) Active Problems:   Type 1 diabetes, uncontrolled, with neuropathy   GERD   Painful diabetic neuropathy   Chronic low back pain   Gastroparesis due to DM   Protein-calorie malnutrition, severe   Leg weakness   Bilateral lower leg cellulitis   Altered mental status   Transverse myelitis    Jason [name] is a 28 y.o. male with  DM, peripheral neuropathy with poor sensation in feet sp burn to feet with heating pad  use or worn at night with ulcerations of the care for the wound care by Jud[facility] Companion. His had debridement of these ulcers and been on various courses of antibiotics. 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  #1 Transverse myelitis: No clear-cut relationship to any specific infectious disease I doubt there is any active infectious disease driving this process. --I. agree with  IV corticosteroids and other immunomodulatory therapy per neurology. VZV PCR negative.  --DC acyclovir   #2 Lower extremity Diabetic foot ulcers: HgA1c = 12.4  Plain films do not show osteomyelitis but ESR has been persistently high since December and ulcers have not resolved in at least 4 months.  I proposed MRI imaging but he did not wish to proceed with that at this time  He should have  ABIs at some course during his stay  Hopefully pressure off loading and wound care and eventually better glycemic control will improve these. If not it is fairly inevitable that they will progress deeper to DF Osteomyelitis which could  require amputations of digits, and more proximal morbid amputations   doxycyline for now  He should continue on the doxy x 30 days and followup with wound care clinic and with me in my clinic in next 1-2 months re his DFI   #3 THrush; 14 days of fluconazole  I will sign off for now.  Please call with further questions..   #3 screening: HIV and syphilis tests have been negative we'll send a hepatitis panel  negative   I will followup the results of his VZV PCR but will otherwise sign off for now  I will arrange for HSFU in my clinic in the next 1-2 months      LOS: 5 days   Roderic Scarce  Darrelyn Hillock 02/03/2014, 3:23 PM

## 2014-02-03 NOTE — Consult Note (Signed)
Physical Medicine and Rehabilitation Consult  Reason for Consult: Transverse myelitis  Referring Physician: Dr. McClung.  HPI: Jason [name] is a 27 y.o. male with h/o DM type 1, diabetic neuropathy with sensory deficits, as well as BLE diabetic ulcers x 4 month, h/o left elbow MRSA boil as well as gastroparesis who was admitted on 01/29/14 with lethargy, BLE weakness and inability to ambulate. He was found to have bilateral LE cellulitis, possible PNA, Acute on chronic renal injury, leucocytosis with WBC 32k as well as hyperglycemia. He was started on IV antibiotics as well as insulin drip. 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  and MRI cervical spine with hyperintense intramedullary cystic lesion extending from the C4 through T1-2 level question syrinx v/s transverse myelitis. Patient has worsening of MS with progressive BUE weakness on 01/20/14 due to SIRS/sepsis and treatment broadened with addition of fluconazole and acyclovir per ID input. LP with elevated glucose-132 and protein-111 and cultures negative so far. He was started on IV solumedrol with recommendation for repeat MRI cervical and thoracic spine pending.  Antibiotics de-escalated and Dr. Van Dam recommends 30 day course of doxycyline for chronic non-healing ulcers BLE with persistently elevated ESR as well as ABI at some course during his stay. WOC consulted for input on BLE ulcers and feels likely vasculitic in nature--santyl added for chemical debridement. 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 Therapy ongoing and CIR recommended by rehab team.  Patient is complaining of tail bone pain. He's been up in a recliner with legs elevated for several minutes, transferred with 2 people but did not use a lift  He has not been able to extend his  fingers 4 number of years but only in the last 2 days has C. loss additional hand function. Patient has noted some numbness in the hands prior to admission but mainly he has had foot numbness  Review of Systems  Constitutional: Positive for fever.  HENT: Negative for congestion.  Respiratory: Negative.  Cardiovascular: Negative.  Gastrointestinal: Negative.  Genitourinary:  Foley  Musculoskeletal: Positive for back pain and neck pain.  Neurological: Positive for tingling, sensory change and focal weakness.  Psychiatric/Behavioral: The patient is nervous/anxious.   Past Medical History   Diagnosis  Date   .  Diabetes mellitus    .  GERD (gastroesophageal reflux disease)    .  Asthma    .  Hx MRSA infection      on face   .  Gastroparesis    .  Diabetic neuropathy    .  Seizures     Past Surgical History   Procedure  Laterality  Date   .  Tonsillectomy      Family History   Problem  Relation  Age of Onset   .  Diabetes  Father    .  Hypertension  Father    .  Asthma       fhx   .  Hypertension       fhx   .  Stroke       fhx   .  Heart disease  Mother     Social History: Lives with girlfriend. reports that he has been smoking Cigars. He has never used smokeless tobacco. He reports that he drinks alcohol. He reports that he does not use illicit   drugs.  Allergies   Allergen  Reactions   .  Cefuroxime Axetil  Anaphylaxis   .  Penicillins  Anaphylaxis     ?can take amoxicillin?   .  Tessalon [Benzonatate]  Anaphylaxis   .  Shellfish Allergy  Itching    Medications Prior to Admission   Medication  Sig  Dispense  Refill   .  dexlansoprazole (DEXILANT) 60 MG capsule  Take 1 capsule (60 mg total) by mouth daily.  30 capsule  3   .  doxycycline (VIBRA-TABS) 100 MG tablet  Take 1 tablet (100 mg total) by mouth every 12 (twelve) hours.  10 tablet  0   .  insulin aspart (NOVOLOG) 100 UNIT/ML injection  Inject 1-17 Units into the skin 4 (four) times daily. Per sliding scale  10  mL  11   .  insulin glargine (LANTUS) 100 UNIT/ML injection  Inject 0.2 mLs (20 Units total) into the skin daily.  10 mL  11   .  metoCLOPramide (REGLAN) 10 MG/10ML SOLN  Take 10 mg by mouth 3 (three) times daily as needed for nausea or vomiting.     .  morphine (AVINZA) 60 MG 24 hr capsule  Take 60 mg by mouth daily.     .  Omeprazole-Sodium Bicarbonate (ZEGERID OTC) 20-1100 MG CAPS  Take 1 capsule by mouth at bedtime as needed (for heartburn when out of Dexilant).     .  ondansetron (ZOFRAN) 8 MG tablet  Take 1 tablet (8 mg total) by mouth every 12 (twelve) hours as needed for nausea.  20 tablet  0   .  OxyCODONE (OXYCONTIN) 15 mg T12A 12 hr tablet  Take 1 tablet (15 mg total) by mouth every 12 (twelve) hours.  60 tablet  0   .  oxyCODONE-acetaminophen (PERCOCET) 10-325 MG per tablet  Take 1 tablet by mouth every 6 (six) hours as needed for pain.  120 tablet  0   .  pregabalin (LYRICA) 100 MG capsule  Take 100 mg by mouth 3 (three) times daily.     .  silver sulfADIAZINE (SILVADENE) 1 % cream  Apply topically daily.  50 g  0    Home:  Home Living  [name]  Living Arrangements: Spouse/significant other;Children  Additional Comments: Able to provide 24/7 S after D/C. 1 level home. level entry. bathroom not accessible  Functional History:  Prior Function  Level of Independence: Independent  Comments: on disability  Functional Status:  Mobility:  Bed Mobility  Overal bed mobility: Needs Assistance  Bed Mobility: Supine to Sit  Sidelying to sit: Total assist  Supine to sit: Total assist;+2 for physical assistance;HOB elevated  Sit to supine: Total assist;+2 for physical assistance  General bed mobility comments: Assist for movement of LE's to EOB, as well as hand-over-hand assist to reach for bed rails. Able to lightly grasp the bed rails however unable to grip or use bed rail for support to pull himself up on.  Transfers  Overall  transfer level: Needs assistance  Equipment used: 2 person hand held assist  Transfers: Sit to/from Stand;Squat Pivot Transfers  Sit to Stand: Total assist;+2 physical assistance  Squat pivot transfers: Total assist;+2 physical assistance  General transfer comment: Blocking of bilateral knees as well as bed pad use to sling hips while transferring bed to chair.    ADL:  ADL  Overall ADL's : Needs assistance/impaired  Eating/Feeding: Maximal assistance;Bed level;With adaptive utensils    Eating/Feeding Details (indicate cue type and reason): pt using Lt hand to self feed grits with built up red handled spoon  Grooming: Total assistance;Sitting  Grooming Details (indicate cue type and reason): wash face  Upper Body Bathing: Total assistance;Bed level  Upper Body Bathing Details (indicate cue type and reason): pt able to AROM shoulder flexion for underarm bathing and deordorant  Lower Body Bathing: Total assistance;Bed level  Upper Body Dressing : Total assistance  Functional mobility during ADLs: (did not get pt OOB today. Will need lift at this time)  General ADL Comments: Pt supine on arrival crying out into hallway due to buttock pain. OT arriving and immediately log rolling patient onto left side. Pt reports immediate relief. Pt could benefit from air mattress. Pt is high risk for skin break down. pt noted to have wounds on bil anterior aspect of shin and bil big toes. Pt log rolled Rt and left for peri care and change linens. Pt progressed to EOB for static sitting. pt reports pain and returned to supine with pillow under left hip. Pt reports much improved positioning. pt with HOB elevated for self feeding. Mother present and discussing mothers personal disabilities and inability to (A) patient at this level.  Cognition:  Cognition  Overall Cognitive Status: Impaired/Different from baseline  Orientation Level: Oriented X4  Cognition  Arousal/Alertness: Awake/alert  Behavior During  Therapy: Flat affect  Overall Cognitive Status: Impaired/Different from baseline  Area of Impairment: Memory  Memory: Decreased short-term memory  General Comments: mother correcting patient and reporting to therapist the events pt states took place have not. ( pt reports taking bath this AM)  Blood pressure 118/79, pulse 90, temperature 98.2 F (36.8 C), temperature source Oral, resp. rate 8, height 5' 7.5" (1.715 m), weight 67.9 kg (149 lb 11.1 oz), SpO2 95.00%.  Physical Exam  Nursing note and vitals reviewed.  Constitutional: He is oriented to person, place, and time. He appears well-developed and well-nourished.  HENT:  Head: Normocephalic and atraumatic.  Eyes: Conjunctivae and EOM are normal. Pupils are equal, round, and reactive to light.  Cardiovascular: Normal rate and regular rhythm.  Respiratory: Effort normal and breath sounds normal. No respiratory distress.  GI: Soft. Bowel sounds are normal. He exhibits no distension. There is no tenderness.  Neurological: He is alert and oriented to person, place, and time. He displays atrophy. A sensory deficit is present. He exhibits abnormal muscle tone.  Absent to light touch both lower extremities does have sensation to light touch in the upper extremities  Motor strength 5 bilateral deltoid bicep 3 bilateral tricep 3 minus wrist extension, trace finger flexion 0 hand intrinsics 0 strength in both lower extremities Tone is reduced in both upper and lower extremities  Skin: Skin is dry.  Psychiatric:  Patient focused on tail bone pain but does cooperate with examination   Results for orders placed during the hospital encounter of 01/29/14 (from the past 24 hour(s))   GLUCOSE, CAPILLARY Status: Abnormal    Collection Time    02/02/14 12:57 PM   Result  Value  Ref Range    Glucose-Capillary  189 (*)  70 - 99 mg/dL    Comment 1  Notify RN     Comment 2  Documented in Chart    GLUCOSE, CAPILLARY Status: Abnormal    Collection Time     02/02/14 5:12 PM   Result  Value  Ref Range    Glucose-Capillary  224 (*)  70 - 99 mg/dL     VANCOMYCIN, TROUGH Status: Abnormal    Collection Time    02/02/14 6:15 PM   Result  Value  Ref Range    Vancomycin Tr  25.3 (*)  10.0 - 20.0 ug/mL   GLUCOSE, CAPILLARY Status: Abnormal    Collection Time    02/02/14 9:04 PM   Result  Value  Ref Range    Glucose-Capillary  257 (*)  70 - 99 mg/dL   GLUCOSE, CAPILLARY Status: Abnormal    Collection Time    02/02/14 11:58 PM   Result  Value  Ref Range    Glucose-Capillary  310 (*)  70 - 99 mg/dL   GLUCOSE, CAPILLARY Status: Abnormal    Collection Time    02/03/14 4:30 AM   Result  Value  Ref Range    Glucose-Capillary  247 (*)  70 - 99 mg/dL    Comment 1  Notify RN    GLUCOSE, CAPILLARY Status: Abnormal    Collection Time    02/03/14 8:00 AM   Result  Value  Ref Range    Glucose-Capillary  160 (*)  70 - 99 mg/dL    No results found.  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 Does the need for close, 24 hr/day medical supervision in concert with the patient's rehab needs make it unreasonable for this patient to be served in a less intensive setting? Potentially 2. Co-Morbidities requiring supervision/potential complications: Type 1 diabetes uncontrolled with neuropathy, sepsis, Chiari malformation with neck pain 3. Due to bladder management, bowel management, safety, skin/wound care, disease management, medication administration, pain management and patient education, does the patient require 24 hr/day rehab nursing? Potentially 4. Does the patient require coordinated care of a physician, rehab nurse, PT (1-2 hrs/day, 5 days/week) and OT (1-2 hrs/day, 5 days/week) to address physical and functional deficits in the context of the above medical diagnosis(es)? Potentially Addressing deficits in the following areas: balance, endurance, locomotion, strength, transferring, bowel/bladder  control, bathing, dressing, feeding, grooming and toileting 5. Can the patient actively participate in an intensive therapy program of at least 3 hrs of therapy per day at least 5 days per week? Potentially 6. The potential for patient to make measurable gains while on inpatient rehab is fair 7. Anticipated functional outcomes upon discharge from inpatient rehab are mod assist with PT, mod assist with OT, n/a with SLP. 8. Estimated rehab length of stay to reach the above functional goals is: 4wk 9. Does the patient have adequate social supports to accommodate these discharge functional goals? Potentially 10. Anticipated D/C setting: Home vs. SNF depending on functional outcomes and family/caregiver abilities 11. Anticipated post D/C treatments: HH therapy 12. Overall Rehab/Functional Prognosis: fair RECOMMENDATIONS:  This patient's condition is appropriate for continued rehabilitative care in the following setting: CIR once workup is completed and patient able to tolerate therapy  Patient has agreed to participate in recommended program. Yes  Note that insurance prior authorization may be required for reimbursement for recommended care.  Comment: Patient has had severe diabetic neuropathy prior to admission with poor sensation in the lower limbs as well as bilateral hand contractures, per family he had been ambulatory prior to admission and able to use both upper extremities for functional tasks  02/03/2014  

## 2014-02-03 NOTE — Progress Notes (Signed)
Occupational Therapy Treatment Patient Details Name: Jason [name] MRN: [id number] DOB: 1986/03/09 Today's Date: 02/03/2014    History of present illness pt presents weakness x 4 extremeties. Ruling out Transverse Myelitis.  Sepsis, DM, Bil LE Cellulitis, and Bil LE wounds from burning feet with slippers.     OT comments  Pt required strong encouragement this session. Pt needed total +2 extensive (A) to transfer to chair this session. Pt using red foam and blue mug for self feeding. RN and family educated to have patient complete feeding/ drinking/ taking medications to help with AROM of UEs and only help to (A) if required. Continuing to recommend CIr at this time but going to reevaluate next session.   Follow Up Recommendations  CIR;Supervision/Assistance - 24 hour    Equipment Recommendations  3 in 1 bedside comode;Wheelchair (measurements OT);Wheelchair cushion (measurements OT);Hospital bed (mattress overlay)    Recommendations for Other Services Rehab consult    Precautions / Restrictions Precautions Precautions: Fall Precaution Comments: high risk for skin breakdown Required Braces or Orthoses: Other Brace/Splint Other Brace/Splint: B prevalon boots       Mobility Bed Mobility Overal bed mobility: Needs Assistance;+2 for physical assistance Bed Mobility: Supine to Sit     Supine to sit: Max assist;+2 for physical assistance;HOB elevated     General bed mobility comments: assist and v/c to reach with LT UE toward bed rail. pt requires use of pad to progres to EOB. pt using bil UE in full elbow extensino to help (A) with sitting static  Transfers Overall transfer level: Needs assistance Equipment used: 2 person hand held assist Transfers: Stand Pivot Transfers Sit to Stand: Total assist;+2 physical assistance Stand pivot transfers: Total assist;+2 physical assistance       General transfer comment: blocking bil LE, Pt with hip flexion     Balance Overall  balance assessment: Needs assistance Sitting-balance support: Feet supported;Bilateral upper extremity supported Sitting balance-Leahy Scale: Zero   Postural control: Posterior lean                         ADL Overall ADL's : Needs assistance/impaired     Grooming: Moderate assistance;Bed level Grooming Details (indicate cue type and reason): wash cloth placed in Lt UE and pt able to rub LT UE against face this session and able to use shoulder flexion/ extension to rack digits on head to scratch.                  Toilet Transfer: +2 for physical assistance;Total assistance Toilet Transfer Details (indicate cue type and reason): transfer EOB to chair with bil LE blocked         Functional mobility during ADLs:  (tranfer only total +2 Total) General ADL Comments: Pt needed max motivation this session and educated on d/c recommendations pending evaluations. pt closing eyes 80% of session. pt s mother and fiance present to help motivate patietn to participate today. pt unable to verbalize reason for decr motivation. pt reports pain "all over" with questioning cues not defining. Ot unable to gain clear understanding of limitations this session questions depression?? frustration with slow recovery process?? Pt reports bed surface being more comfortable now on mattress overlay.      Vision                     Perception     Praxis      Cognition   Behavior During Therapy: Flat affect Overall  Cognitive Status: Impaired/Different from baseline Area of Impairment: Memory     Memory: Decreased short-term memory          General Comments: Pt provided instructions from Rn and 30 seconds later patient asking the same question. Pt with no recall of previous information    Extremity/Trunk Assessment               Exercises     Shoulder Instructions       General Comments      Pertinent Vitals/ Pain       Reports pain everywhere with no details  with max questioning  Home Living                                          Prior Functioning/Environment              Frequency Min 3X/week     Progress Toward Goals  OT Goals(current goals can now be found in the care plan section)  Progress towards OT goals: Progressing toward goals  Acute Rehab OT Goals Patient Stated Goal: to do more for myself OT Goal Formulation: With patient Time For Goal Achievement: 02/15/14 Potential to Achieve Goals: Good ADL Goals Pt Will Perform Eating: with min assist;with assist to don/doff brace/orthosis;with adaptive utensils;sitting Pt Will Perform Grooming: with min assist;sitting;with adaptive equipment Pt/caregiver will Perform Home Exercise Program: Increased strength;Both right and left upper extremity;With theraputty;With written HEP provided Additional ADL Goal #1: Maintain sitting balance EOB x 10 min with min A for ADL Additional ADL Goal #2: Demonstrate funcitonal pinch pattern R/L hand using tenodesis/compensatory technique to increase independence with ADL  Plan Discharge plan remains appropriate    Co-evaluation                 End of Session Equipment Utilized During Treatment: Gait belt   Activity Tolerance Patient tolerated treatment well   Patient Left in chair;with call bell/phone within reach;with family/visitor present   Nurse Communication Mobility status;Precautions;Need for lift equipment        Time: 1021-1046 OT Time Calculation (min): 25 min  Charges: OT General Charges $OT Visit: 1 Procedure OT Treatments $Self Care/Home Management : 8-22 mins $Therapeutic Activity: 8-22 mins  Peri Maris 02/03/2014, 2:02 PM Pager: [phone]

## 2014-02-03 NOTE — Progress Notes (Signed)
Physical Therapy Treatment Patient Details Name: Jason [name] MRN: [id number] DOB: 1986/05/16 Today's Date: 02/03/2014    History of Present Illness pt presents weakness x 4 extremeties. Ruling out Transverse Myelitis.  Sepsis, DM, Bil LE Cellulitis, and Bil LE wounds from burning feet with slippers.      PT Comments    Pt progressing towards physical therapy goals. Was able to show some improvement with trunk stability in the sitting position, including ability to prop elbows on arms rests and hold himself up longer than yesterday's session. Pt appeared to be frustrated throughout session today which is limiting his motivation to engage in a long therapy session. Motivated to participate at beginning, asking to perform more reps of trunk exercise. Says he knows he needs to sit up and stay out of the bed as much as possible - pt positioned for comfort at end of session.   Follow Up Recommendations  CIR     Equipment Recommendations  Other (comment) (TBD)    Recommendations for Other Services Rehab consult     Precautions / Restrictions Precautions Precautions: Fall Precaution Comments: high risk for skin breakdown Required Braces or Orthoses: Other Brace/Splint Other Brace/Splint: B prevalon boots Restrictions Weight Bearing Restrictions: No    Mobility  Bed Mobility Overal bed mobility: Needs Assistance;+2 for physical assistance Bed Mobility: Supine to Sit     Supine to sit: Max assist;+2 for physical assistance;HOB elevated     General bed mobility comments: Pt received sitting up in the chair.  Transfers Overall transfer level: Needs assistance Equipment used: 2 person hand held assist Transfers: Stand Pivot Transfers Sit to Stand: Total assist;+2 physical assistance Stand pivot transfers: Total assist;+2 physical assistance       General transfer comment: Transfers not assessed as pt fatigued and in pain from sitting balance activities.    Ambulation/Gait                 Stairs            Wheelchair Mobility    Modified Rankin (Stroke Patients Only)       Balance Overall balance assessment: Needs assistance Sitting-balance support: Feet supported;No upper extremity supported Sitting balance-Leahy Scale: Zero Sitting balance - Comments: Sat edge of chair ~2 minutes at a time. Pt wanting to use elbows to balance on armrests of chair and with difficulty maintaining upright position without UE support.  Postural control: Posterior lean                          Cognition Arousal/Alertness: Lethargic Behavior During Therapy: Flat affect Overall Cognitive Status: Within Functional Limits for tasks assessed Area of Impairment: Memory     Memory: Decreased short-term memory         General Comments: Pt provided instructions from Rn and 30 seconds later patient asking the same question. Pt with no recall of previous information    Exercises Other Exercises Other Exercises: anterior trunk lean in chair; VC's for head use to control trunk. x5 Other Exercises: Static sitting without UE support. 2x2 minutes    General Comments        Pertinent Vitals/Pain States his arms and shoulders continue to be painful. Hot packs issued for muscular relief.     Home Living                      Prior Function  PT Goals (current goals can now be found in the care plan section) Acute Rehab PT Goals Patient Stated Goal: to do more for myself PT Goal Formulation: With patient Time For Goal Achievement: 02/15/14 Potential to Achieve Goals: Good Progress towards PT goals: Progressing toward goals    Frequency  Min 4X/week    PT Plan Current plan remains appropriate    Co-evaluation             End of Session   Activity Tolerance: Patient limited by fatigue;Patient limited by lethargy Patient left: in chair;with call bell/phone within reach;with family/visitor  present     Time: 0109-3235 PT Time Calculation (min): 27 min  Charges:  $Therapeutic Activity: 8-22 mins $Neuromuscular Re-education: 8-22 mins                    G Codes:      Jolyn Lent 02-07-2014, 2:26 PM

## 2014-02-03 NOTE — Progress Notes (Signed)
[facility] TEAM 1 - Stepdown/ICU TEAM Progress Note  Jason [name] [id number] DOB: 02/27/1986 DOA: 01/29/2014 PCP: [name], MD  Admit HPI / Brief Narrative: 28 y.o. M w/ Hx Diabetes, diabetic neuropathy, gastroparesis, GERD, diabetes ulcers. The patient was brought in by his family 4/24. The patient went to work and drove his car in the morning. In the afternoon at around 4:00 the patient went to sleep and when the significant other tried to wake the patient up at 8 PM the patient was complaining that he could not feel his legs and could not move his legs. Patient has history of diabetic neuropathy due to which he does not have significant sensation in his legs. Patient went to sleep again and around midnight when significant other checked his sugar it was registered as "high". She gave him some insulin and in the morning when the patient woke up he continued to complain of numbness and weakness in his legs with inability to move. EMS was then summoned.   There was no trauma no fall no fever no chills no cough no chest pain no shortness of breath reported. There was no diarrhea or constipation no abdominal pain no nausea no vomiting reported.   HPI/Subjective: The patient has no new complaints today.  He denies sob or signif chest pain.    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 1000 mg daily -weakness of triceps and intrinsic hand muscles as well as paralysis of lower extremities  Diabetes uncontrolled -cont to adjust insulin as CBG is uncontrolled secondary to large steroid dosing -Continue resistant SSI -HgA1c 12.4  Diabetic neuropathy -Neurontin 300 mg each bedtime  Diabetic gastroparesis -Continue metoclopramide 10 mg TID  Diabetic foot ulcers -abx per ID - will request ABIs   Chronic pain syndrome Secondary to diabetic neuropathy; narcotics do poorly against this type of pain - attempting to de-escalate narcotic use  but will allow prn dosing with therapy which is painful to pt  -Continue morphine 30 mg BID -Neurontin 300 mg each bedtime -Continue Robaxin 500 mg TID  Code Status: FULL Family Communication: spoke w/ pt and mother at bedside  Disposition Plan: await CIR determination - if pt can go to CIR soon will keep in SDU, if wait will be >48hrs will plan to transfer to neuro floor   Consultants: Dr. Dorian Pod (neurology) Dr. Rhina Brackett [place] (infectious disease)  Antibiotics: Doxycycline 4/28 >> Fluconazole 4/25 >> Acyclovir 4/25>>4/29 Aztreonam 4/24 >> 4/26 Vanc 4/24 >> 4/28 Levaquin 4/25>>4/26  DVT prophylaxis: Lovenox   LINES / TUBES:  4/27 20 Ga right forearm  Objective: VITAL SIGNS: Temp: 98.3 F (36.8 C) (04/29 1228) Temp src: Oral (04/29 1228) BP: 140/88 mmHg (04/29 1228) Pulse Rate: 90 (04/29 0400) SPO2; 100% on room air FIO2:   Intake/Output Summary (Last 24 hours) at 02/03/14 1337 Last data filed at 02/03/14 1228  Gross per 24 hour  Intake   4153 ml  Output   1550 ml  Net   2603 ml   Exam: General: no acute resp distress Lungs: Clear to auscultation bilaterally without wheezes or crackles Cardiovascular: Regular rate and rhythm without murmur gallop or rub  Abdomen: Nontender, nondistended, soft, bowel sounds positive, no rebound, no ascites, no appreciable mass Extremities: No significant cyanosis, clubbing, or edema bilateral lower extremities Neurologic: strength bilateral lower extremities 0/5, loss of sensation below the knees bilateral, upper extremity loss of fine motor coordination hands  Data Reviewed: Basic Metabolic Panel:  Recent  Labs Lab 01/29/14 2113 01/30/14 0428 01/31/14 0250 02/01/14 0305 02/02/14 0309  NA 138 138 138 132* 136*  K 3.9 4.2 3.8 3.8 4.0  CL 103 104 103 98 101  CO2 22 21 24 22 24   GLUCOSE 136* 227* 163* 246* 176*  BUN 22 20 14 11 14   CREATININE 0.98 0.94 0.79 0.78 0.84  CALCIUM 8.4 8.5 8.2* 8.2* 8.3*  MG 1.7   --   --   --   --   PHOS 3.1  --   --   --   --    Liver Function Tests:  Recent Labs Lab 01/29/14 0858 01/29/14 2113 01/30/14 0428 02/01/14 0305 02/02/14 0309  AST 10 12 9 7 11   ALT 9 8 7 5 7   ALKPHOS 179* 190* 171* 152* 495*  BILITOT 0.2* 0.3 0.3 0.3 0.2*  PROT 7.6 6.8 6.7 6.9 6.8  ALBUMIN 1.8* 1.5* 1.5* 1.4* 1.4*    Recent Labs Lab 01/29/14 0858  LIPASE 10*   CBC:  Recent Labs Lab 01/29/14 0858 01/30/14 0428 01/31/14 0250 02/01/14 0305 02/02/14 0309  WBC 32.0* 33.6* 33.5* 38.0* 49.7*  NEUTROABS 28.5* 28.2*  --   --  43.2*  HGB 9.4* 8.4* 8.0* 8.6* 9.1*  HCT 28.1* 25.6* 24.7* 26.5* 27.6*  MCV 77.6* 76.9* 77.9* 77.7* 77.3*  PLT 462* 434* 447* 466* 559*   Cardiac Enzymes:  Recent Labs Lab 01/29/14 2113  CKTOTAL 41   BNP (last 3 results)  Recent Labs  09/28/13 0430 10/02/13 0445 01/29/14 2113  PROBNP 673.0* 2451.0* 1343.0*   CBG:  Recent Labs Lab 02/02/14 1712 02/02/14 2104 02/02/14 2358 02/03/14 0430 02/03/14 0800  GLUCAP 224* 257* 310* 247* 160*    Recent Results (from the past 240 hour(s))  CULTURE, BLOOD (ROUTINE X 2)     Status: None   Collection Time    01/29/14  9:15 AM      Result Value Ref Range Status   Specimen Description BLOOD LEFT ARM   Final   Special Requests BOTTLES DRAWN AEROBIC AND ANAEROBIC 5CC EACH   Final   Culture  Setup Time     Final   Value: 01/29/2014 14:12     Performed at [facility]   Culture     Final   Value:        BLOOD CULTURE RECEIVED NO GROWTH TO DATE CULTURE WILL BE HELD FOR 5 DAYS BEFORE ISSUING A FINAL NEGATIVE REPORT     Performed at [facility]   Report Status PENDING   Incomplete  URINE CULTURE     Status: None   Collection Time    01/29/14  9:28 AM      Result Value Ref Range Status   Specimen Description URINE, CATHETERIZED   Final   Special Requests NONE   Final   Culture  Setup Time     Final   Value: 01/29/2014 14:22     Performed at [facility]     Final   Value: NO GROWTH     Performed at [facility]   Culture     Final   Value: NO GROWTH     Performed at [facility]   Report Status 01/30/2014 FINAL   Final  CULTURE, BLOOD (ROUTINE X 2)     Status: None   Collection Time    01/29/14  9:35 AM      Result Value Ref Range Status  Specimen Description BLOOD RIGHT ARM   Final   Special Requests BOTTLES DRAWN AEROBIC AND ANAEROBIC 5CC BOTH   Final   Culture  Setup Time     Final   Value: 01/29/2014 14:12     Performed at [facility]   Culture     Final   Value:        BLOOD CULTURE RECEIVED NO GROWTH TO DATE CULTURE WILL BE HELD FOR 5 DAYS BEFORE ISSUING A FINAL NEGATIVE REPORT     Performed at [facility]   Report Status PENDING   Incomplete  MRSA PCR SCREENING     Status: None   Collection Time    01/30/14 11:59 AM      Result Value Ref Range Status   MRSA by PCR NEGATIVE  NEGATIVE Final   Comment:            The GeneXpert MRSA Assay (FDA     approved for NASAL specimens     only), is one component of a     comprehensive MRSA colonization     surveillance program. It is not     intended to diagnose MRSA     infection nor to guide or     monitor treatment for     MRSA infections.  GC/CHLAMYDIA PROBE AMP     Status: None   Collection Time    01/30/14  1:35 PM      Result Value Ref Range Status   CT Probe RNA NEGATIVE  NEGATIVE Final   GC Probe RNA NEGATIVE  NEGATIVE Final   Comment: (NOTE)                                                                                               **Normal Reference Range: Negative**          Assay performed using the Gen-Probe APTIMA COMBO2 (R) Assay.     Acceptable specimen types for this assay include APTIMA Swabs (Unisex,     endocervical, urethral, or vaginal), first void urine, and ThinPrep     liquid based cytology samples.     Performed at [facility]     Status: None   Collection Time    01/30/14 10:45 PM       Result Value Ref Range Status   Specimen Description CSF   Final   Special Requests NO4 [facility]   Final   Gram Stain     Final   Value: WBC PRESENT,BOTH PMN AND MONONUCLEAR     NO ORGANISMS SEEN     CYTOSPIN SLIDE   Report Status 01/30/2014 FINAL   Final  CSF CULTURE     Status: None   Collection Time    01/30/14 10:45 PM      Result Value Ref Range Status   Specimen Description CSF   Final   Special Requests CSF NO4 [place]   Final   Gram Stain     Final   Value: CYTOSPIN SLIDE WBC PRESENT,BOTH PMN AND MONONUCLEAR     NO ORGANISMS SEEN  Performed at [facility]     Performed at [facility]   Culture     Final   Value: NO GROWTH 3 DAYS     Performed at [facility]   Report Status 02/03/2014 FINAL   Final  FUNGAL STAIN     Status: None   Collection Time    01/30/14 10:45 PM      Result Value Ref Range Status   Specimen Description CSF   Final   Special Requests CSF NO4 [facility]   Final   Fungal Smear     Final   Value: NO YEAST OR FUNGAL ELEMENTS SEEN     Performed at [facility]   Report Status 01/31/2014 FINAL   Final     Studies:  Recent x-ray studies have been reviewed in detail by the Attending Physician  Scheduled Meds:  Scheduled Meds: . chlorhexidine  15 mL Mouth Rinse BID  . collagenase   Topical Daily  . doxycycline  100 mg Oral Q12H  . enoxaparin (LOVENOX) injection  40 mg Subcutaneous Q24H  . fluconazole  100 mg Oral Daily  . gabapentin  300 mg Oral QHS  . insulin aspart  0-20 Units Subcutaneous 6 times per day  . insulin glargine  22 Units Subcutaneous BID  . lidocaine  1 patch Transdermal Q24H  . loratadine  10 mg Oral Daily  . morphine  30 mg Oral Q12H  . sodium chloride  3 mL Intravenous Q12H    Time spent on care of this patient: 35 mins  Cherene Altes , MD   [facility] Office  [phone]  On-Call/Text Page:      [email or web address]      password [email or web address]  If 7PM-7AM, please contact  night-coverage [email or web address] Password [email or web address] 02/03/2014, 1:37 PM   LOS: 5 days

## 2014-02-03 NOTE — Progress Notes (Signed)
Subjective: Patient had no new complaints. He has not experienced a return of movement in lower extremities.  Objective: Current vital signs: BP 118/79  Pulse 90  Temp(Src) 98.2 F (36.8 C) (Oral)  Resp 8  Ht 5' 7.5" (1.715 m)  Wt 67.9 kg (149 lb 11.1 oz)  BMI 23.09 kg/m2  SpO2 95%  Neurologic Exam: Somnolent but easy to arouse. Mental status was normal. Extraocular movements were normal. No facial weakness was noted. Speech was normal. Bilateral triceps weakness with 4/5 strength noted; 4/5 wrist extensors noted; unable to extend fingers with marked weakness of intrinsic hand muscles as well. Total absence of voluntary movement of lower extremities with flaccid muscle tone throughout. Deep tendon reflexes absent in lower extremities and 1+ in upper extremities.  Medications: I have reviewed the patient's current medications. This is day 4 of planned 5 day treatment with high-dose methylprednisolone.  Assessment/Plan: Likely transverse myelitis involving lower cervical region with weakness of triceps and intrinsic hand muscles as well as paralysis of lower extremities. Patient has shown no clear signs of improvement, so far.  Recommend no changes in current management with continuation of high-dose steroids for a total of 5 days. Also recommend continued physical and occupational therapy intervention. Patient will likely have a protracted course of rehabilitation.  We will continue to follow this patient with you.  C.R. Nicole Kindred, MD [facility] [phone]  02/03/2014  9:38 AM

## 2014-02-04 ENCOUNTER — Inpatient Hospital Stay (HOSPITAL_COMMUNITY): Payer: Medicaid Other

## 2014-02-04 DIAGNOSIS — Z9119 Patient's noncompliance with other medical treatment and regimen: Secondary | ICD-10-CM

## 2014-02-04 DIAGNOSIS — Z91199 Patient's noncompliance with other medical treatment and regimen due to unspecified reason: Secondary | ICD-10-CM

## 2014-02-04 DIAGNOSIS — G894 Chronic pain syndrome: Secondary | ICD-10-CM

## 2014-02-04 DIAGNOSIS — E11621 Type 2 diabetes mellitus with foot ulcer: Secondary | ICD-10-CM | POA: Diagnosis present

## 2014-02-04 DIAGNOSIS — K3184 Gastroparesis: Secondary | ICD-10-CM

## 2014-02-04 DIAGNOSIS — B370 Candidal stomatitis: Secondary | ICD-10-CM | POA: Diagnosis present

## 2014-02-04 DIAGNOSIS — E1149 Type 2 diabetes mellitus with other diabetic neurological complication: Secondary | ICD-10-CM

## 2014-02-04 DIAGNOSIS — L97509 Non-pressure chronic ulcer of other part of unspecified foot with unspecified severity: Secondary | ICD-10-CM

## 2014-02-04 LAB — GLUCOSE, CAPILLARY
GLUCOSE-CAPILLARY: 54 mg/dL — AB (ref 70–99)
Glucose-Capillary: 108 mg/dL — ABNORMAL HIGH (ref 70–99)
Glucose-Capillary: 138 mg/dL — ABNORMAL HIGH (ref 70–99)
Glucose-Capillary: 151 mg/dL — ABNORMAL HIGH (ref 70–99)
Glucose-Capillary: 164 mg/dL — ABNORMAL HIGH (ref 70–99)
Glucose-Capillary: 61 mg/dL — ABNORMAL LOW (ref 70–99)

## 2014-02-04 LAB — CULTURE, BLOOD (ROUTINE X 2)
CULTURE: NO GROWTH
Culture: NO GROWTH

## 2014-02-04 MED ORDER — DEXTROSE 50 % IV SOLN
25.0000 mL | Freq: Once | INTRAVENOUS | Status: AC | PRN
  Filled 2014-02-04: qty 50

## 2014-02-04 MED ORDER — LORAZEPAM 2 MG/ML IJ SOLN
0.5000 mg | Freq: Once | INTRAMUSCULAR | Status: AC
  Administered 2014-02-04: 0.5 mg via INTRAVENOUS
  Filled 2014-02-04: qty 1

## 2014-02-04 MED ORDER — GADOBENATE DIMEGLUMINE 529 MG/ML IV SOLN
15.0000 mL | Freq: Once | INTRAVENOUS | Status: AC | PRN
  Administered 2014-02-04: 14 mL via INTRAVENOUS

## 2014-02-04 MED ORDER — DEXTROSE 50 % IV SOLN
INTRAVENOUS | Status: AC
  Filled 2014-02-04: qty 50

## 2014-02-04 MED ORDER — SIMETHICONE 80 MG PO CHEW
160.0000 mg | CHEWABLE_TABLET | Freq: Four times a day (QID) | ORAL | Status: DC | PRN
  Filled 2014-02-04: qty 2

## 2014-02-04 MED ORDER — DEXTROSE 50 % IV SOLN
1.0000 | Freq: Once | INTRAVENOUS | Status: AC
  Administered 2014-02-04: 50 mL via INTRAVENOUS

## 2014-02-04 MED ORDER — DEXTROSE 50 % IV SOLN
25.0000 mL | Freq: Once | INTRAVENOUS | Status: AC | PRN
Start: 2014-02-04 — End: 2014-02-04

## 2014-02-04 NOTE — Progress Notes (Signed)
VASCULAR LAB PRELIMINARY  ARTERIAL  ABI completed:    RIGHT    LEFT    PRESSURE WAVEFORM  PRESSURE WAVEFORM  BRACHIAL 147 triphasic BRACHIAL  triphasic  DP   DP    AT 173 Dampened monophasic AT 176 triphasic  PT 188 triphasic PT 190 triphasic  PER   PER    GREAT TOE  adequate GREAT TOE  adequate    RIGHT LEFT  ABI >1.0 >1.0     Charlaine Dalton, RVT 02/04/2014, 10:09 AM

## 2014-02-04 NOTE — Progress Notes (Signed)
I met with pt and his Mom at bedside. We discussed an inpt rehab stay when medically ready. They are in agreement. Pt states he will d/c home with his fiance at d/c who can provide 24/7 care. I will contact her to discuss rehab goals. I will follow up tomorrow. [phone]
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
## 2014-02-04 NOTE — Progress Notes (Signed)
Subjective: Patient had no new complaints. No overnight adverse events reported. He is to receive the fifth of 5 treatments of high dose methylprednisolone today.  Objective: Current vital signs: BP 148/87  Pulse 97  Temp(Src) 100.1 F (37.8 C) (Oral)  Resp 17  Ht 5' 7.5" (1.715 m)  Wt 67.9 kg (149 lb 11.1 oz)  BMI 23.09 kg/m2  SpO2 99%  Neurologic Exam: Alert and in no acute distress. His mental status was normal. No facial weakness noted. Speech was normal. Strength of his upper extremities was normal except for bilateral triceps weakness, left greater than right and intrinsic hand muscle weakness bilaterally. Wrist extensors were 5/5 bilaterally. Patient had no voluntary movement of his lower extremities proximally and distally with flaccid muscle tone.  Medications: I have reviewed the patient's current medications.  Assessment/Plan: Likely transverse myelitis most likely lower cervical level with moderately severe weakness of triceps muscles and severe intrinsic hand muscle weakness bilaterally, as well as bilateral paraplegia. Strength of wrist extensors has improved since yesterday. Exam otherwise is unchanged.  Recommend prednisone taper starting at 60 mg per day and tapering by 10 mg per day.  Also recommend continuing physical and occupational therapy intervention.  We will continue to follow this patient with you.  C.R. Nicole Kindred, MD [facility] [phone]  02/04/2014  12:10 PM

## 2014-02-05 ENCOUNTER — Inpatient Hospital Stay (HOSPITAL_COMMUNITY)
Admission: RE | Admit: 2014-02-05 | Discharge: 2014-03-05 | DRG: 945 | Disposition: A | Discharge status: Home / Self Care | Payer: Medicaid Other | Source: Intra-hospital | Attending: Physical Medicine & Rehabilitation | Admitting: Physical Medicine & Rehabilitation

## 2014-02-05 ENCOUNTER — Encounter (HOSPITAL_COMMUNITY): Payer: Self-pay | Admitting: Rehabilitation

## 2014-02-05 ENCOUNTER — Inpatient Hospital Stay (HOSPITAL_COMMUNITY): Payer: Medicaid Other

## 2014-02-05 DIAGNOSIS — L02419 Cutaneous abscess of limb, unspecified: Secondary | ICD-10-CM

## 2014-02-05 DIAGNOSIS — J45909 Unspecified asthma, uncomplicated: Secondary | ICD-10-CM | POA: Diagnosis present

## 2014-02-05 DIAGNOSIS — E1049 Type 1 diabetes mellitus with other diabetic neurological complication: Secondary | ICD-10-CM | POA: Diagnosis present

## 2014-02-05 DIAGNOSIS — G9519 Other vascular myelopathies: Secondary | ICD-10-CM | POA: Diagnosis present

## 2014-02-05 DIAGNOSIS — E1040 Type 1 diabetes mellitus with diabetic neuropathy, unspecified: Secondary | ICD-10-CM | POA: Diagnosis present

## 2014-02-05 DIAGNOSIS — R5383 Other fatigue: Secondary | ICD-10-CM

## 2014-02-05 DIAGNOSIS — Z5189 Encounter for other specified aftercare: Principal | ICD-10-CM

## 2014-02-05 DIAGNOSIS — L03116 Cellulitis of left lower limb: Secondary | ICD-10-CM

## 2014-02-05 DIAGNOSIS — G894 Chronic pain syndrome: Secondary | ICD-10-CM | POA: Diagnosis present

## 2014-02-05 DIAGNOSIS — E11621 Type 2 diabetes mellitus with foot ulcer: Secondary | ICD-10-CM | POA: Diagnosis present

## 2014-02-05 DIAGNOSIS — R5381 Other malaise: Secondary | ICD-10-CM | POA: Diagnosis present

## 2014-02-05 DIAGNOSIS — Z794 Long term (current) use of insulin: Secondary | ICD-10-CM

## 2014-02-05 DIAGNOSIS — E1149 Type 2 diabetes mellitus with other diabetic neurological complication: Secondary | ICD-10-CM

## 2014-02-05 DIAGNOSIS — G8250 Quadriplegia, unspecified: Secondary | ICD-10-CM | POA: Diagnosis present

## 2014-02-05 DIAGNOSIS — IMO0002 Reserved for concepts with insufficient information to code with codable children: Secondary | ICD-10-CM | POA: Diagnosis present

## 2014-02-05 DIAGNOSIS — N189 Chronic kidney disease, unspecified: Secondary | ICD-10-CM | POA: Diagnosis present

## 2014-02-05 DIAGNOSIS — G9511 Acute infarction of spinal cord (embolic) (nonembolic): Secondary | ICD-10-CM | POA: Diagnosis present

## 2014-02-05 DIAGNOSIS — L03115 Cellulitis of right lower limb: Secondary | ICD-10-CM

## 2014-02-05 DIAGNOSIS — K592 Neurogenic bowel, not elsewhere classified: Secondary | ICD-10-CM | POA: Diagnosis present

## 2014-02-05 DIAGNOSIS — E1065 Type 1 diabetes mellitus with hyperglycemia: Secondary | ICD-10-CM | POA: Diagnosis present

## 2014-02-05 DIAGNOSIS — K5900 Constipation, unspecified: Secondary | ICD-10-CM | POA: Diagnosis present

## 2014-02-05 DIAGNOSIS — Z79899 Other long term (current) drug therapy: Secondary | ICD-10-CM

## 2014-02-05 DIAGNOSIS — N390 Urinary tract infection, site not specified: Secondary | ICD-10-CM | POA: Diagnosis present

## 2014-02-05 DIAGNOSIS — K219 Gastro-esophageal reflux disease without esophagitis: Secondary | ICD-10-CM | POA: Diagnosis present

## 2014-02-05 DIAGNOSIS — M25579 Pain in unspecified ankle and joints of unspecified foot: Secondary | ICD-10-CM | POA: Diagnosis present

## 2014-02-05 DIAGNOSIS — M8448XA Pathological fracture, other site, initial encounter for fracture: Secondary | ICD-10-CM | POA: Diagnosis present

## 2014-02-05 DIAGNOSIS — K3184 Gastroparesis: Secondary | ICD-10-CM

## 2014-02-05 DIAGNOSIS — L97509 Non-pressure chronic ulcer of other part of unspecified foot with unspecified severity: Secondary | ICD-10-CM | POA: Diagnosis present

## 2014-02-05 DIAGNOSIS — E1140 Type 2 diabetes mellitus with diabetic neuropathy, unspecified: Secondary | ICD-10-CM | POA: Diagnosis present

## 2014-02-05 DIAGNOSIS — F4322 Adjustment disorder with anxiety: Secondary | ICD-10-CM | POA: Diagnosis present

## 2014-02-05 DIAGNOSIS — E1069 Type 1 diabetes mellitus with other specified complication: Secondary | ICD-10-CM

## 2014-02-05 DIAGNOSIS — L03119 Cellulitis of unspecified part of limb: Secondary | ICD-10-CM

## 2014-02-05 DIAGNOSIS — IMO0001 Reserved for inherently not codable concepts without codable children: Secondary | ICD-10-CM | POA: Diagnosis present

## 2014-02-05 DIAGNOSIS — F17200 Nicotine dependence, unspecified, uncomplicated: Secondary | ICD-10-CM | POA: Diagnosis present

## 2014-02-05 DIAGNOSIS — E1142 Type 2 diabetes mellitus with diabetic polyneuropathy: Secondary | ICD-10-CM | POA: Diagnosis present

## 2014-02-05 DIAGNOSIS — M62838 Other muscle spasm: Secondary | ICD-10-CM | POA: Diagnosis present

## 2014-02-05 DIAGNOSIS — Z8614 Personal history of Methicillin resistant Staphylococcus aureus infection: Secondary | ICD-10-CM

## 2014-02-05 DIAGNOSIS — L97909 Non-pressure chronic ulcer of unspecified part of unspecified lower leg with unspecified severity: Secondary | ICD-10-CM | POA: Diagnosis present

## 2014-02-05 LAB — COMPREHENSIVE METABOLIC PANEL
ALK PHOS: 115 U/L (ref 39–117)
ALT: 5 U/L (ref 0–53)
AST: 15 U/L (ref 0–37)
Albumin: 1.5 g/dL — ABNORMAL LOW (ref 3.5–5.2)
BILIRUBIN TOTAL: 0.2 mg/dL — AB (ref 0.3–1.2)
BUN: 17 mg/dL (ref 6–23)
CHLORIDE: 102 meq/L (ref 96–112)
CO2: 25 mEq/L (ref 19–32)
Calcium: 8.2 mg/dL — ABNORMAL LOW (ref 8.4–10.5)
Creatinine, Ser: 0.75 mg/dL (ref 0.50–1.35)
GFR calc non Af Amer: >90 mL/min (ref >90)
GLUCOSE: 52 mg/dL — AB (ref 70–99)
POTASSIUM: 4 meq/L (ref 3.7–5.3)
Sodium: 137 mEq/L (ref 137–147)
TOTAL PROTEIN: 6.3 g/dL (ref 6.0–8.3)

## 2014-02-05 LAB — GLUCOSE, CAPILLARY
GLUCOSE-CAPILLARY: 43 mg/dL — AB (ref 70–99)
GLUCOSE-CAPILLARY: 41 mg/dL — AB (ref 70–99)
GLUCOSE-CAPILLARY: 59 mg/dL — AB (ref 70–99)
GLUCOSE-CAPILLARY: 68 mg/dL — AB (ref 70–99)
GLUCOSE-CAPILLARY: 61 mg/dL — AB (ref 70–99)
GLUCOSE-CAPILLARY: 94 mg/dL (ref 70–99)
Glucose-Capillary: 160 mg/dL — ABNORMAL HIGH (ref 70–99)
Glucose-Capillary: 190 mg/dL — ABNORMAL HIGH (ref 70–99)
Glucose-Capillary: 414 mg/dL — ABNORMAL HIGH (ref 70–99)
Glucose-Capillary: 43 mg/dL — CL (ref 70–99)
Glucose-Capillary: 59 mg/dL — ABNORMAL LOW (ref 70–99)
Glucose-Capillary: 73 mg/dL (ref 70–99)

## 2014-02-05 LAB — CBC WITH DIFFERENTIAL/PLATELET
BASOS ABS: 0 10*3/uL (ref 0.0–0.1)
Basophils Relative: 0 % (ref 0–1)
Eosinophils Absolute: 0 10*3/uL (ref 0.0–0.7)
Eosinophils Relative: 0 % (ref 0–5)
HCT: 28.3 % — ABNORMAL LOW (ref 39.0–52.0)
Hemoglobin: 9.2 g/dL — ABNORMAL LOW (ref 13.0–17.0)
Lymphocytes Relative: 11 % — ABNORMAL LOW (ref 12–46)
Lymphs Abs: 5 10*3/uL — ABNORMAL HIGH (ref 0.7–4.0)
MCH: 25.6 pg — ABNORMAL LOW (ref 26.0–34.0)
MCHC: 32.5 g/dL (ref 30.0–36.0)
MCV: 78.6 fL (ref 78.0–100.0)
MONOS PCT: 9 % (ref 3–12)
Monocytes Absolute: 4.1 10*3/uL — ABNORMAL HIGH (ref 0.1–1.0)
NEUTROS PCT: 80 % — AB (ref 43–77)
Neutro Abs: 36.4 10*3/uL — ABNORMAL HIGH (ref 1.7–7.7)
PLATELETS: 644 10*3/uL — AB (ref 150–400)
RBC: 3.6 MIL/uL — ABNORMAL LOW (ref 4.22–5.81)
RDW: 14.9 % (ref 11.5–15.5)
WBC: 45.5 10*3/uL — AB (ref 4.0–10.5)

## 2014-02-05 MED ORDER — FLUCONAZOLE 100 MG PO TABS
100.0000 mg | ORAL_TABLET | Freq: Every day | ORAL | Status: AC
  Administered 2014-02-06 – 2014-02-15 (×10): 100 mg via ORAL
  Filled 2014-02-05 (×11): qty 1

## 2014-02-05 MED ORDER — DEXTROSE 50 % IV SOLN
25.0000 mL | Freq: Once | INTRAVENOUS | Status: AC | PRN
  Administered 2014-02-05: 25 mL via INTRAVENOUS

## 2014-02-05 MED ORDER — INSULIN ASPART 100 UNIT/ML ~~LOC~~ SOLN: 0.0000 [IU] | Freq: Every day | SUBCUTANEOUS | Status: DC

## 2014-02-05 MED ORDER — DOXYCYCLINE HYCLATE 100 MG PO TABS
100.0000 mg | ORAL_TABLET | Freq: Two times a day (BID) | ORAL | Status: AC
  Administered 2014-02-05 – 2014-03-03 (×52): 100 mg via ORAL
  Filled 2014-02-05 (×53): qty 1

## 2014-02-05 MED ORDER — PREDNISONE 20 MG PO TABS
40.0000 mg | ORAL_TABLET | Freq: Every day | ORAL | Status: AC
  Administered 2014-02-08: 40 mg via ORAL
  Filled 2014-02-05: qty 2

## 2014-02-05 MED ORDER — INSULIN ASPART 100 UNIT/ML ~~LOC~~ SOLN
0.0000 [IU] | Freq: Three times a day (TID) | SUBCUTANEOUS | Status: DC
  Administered 2014-02-05: 9 [IU] via SUBCUTANEOUS

## 2014-02-05 MED ORDER — ALUMINUM HYDROXIDE GEL 320 MG/5ML PO SUSP
30.0000 mL | Freq: Four times a day (QID) | ORAL | Status: DC | PRN
  Filled 2014-02-05 (×2): qty 30

## 2014-02-05 MED ORDER — OXYCODONE HCL 5 MG PO TABS
5.0000 mg | ORAL_TABLET | ORAL | Status: DC | PRN
  Administered 2014-02-06 – 2014-02-14 (×15): 10 mg via ORAL
  Administered 2014-02-14: 5 mg via ORAL
  Administered 2014-02-15 – 2014-02-19 (×8): 10 mg via ORAL
  Administered 2014-02-19: 5 mg via ORAL
  Administered 2014-02-19 – 2014-02-23 (×10): 10 mg via ORAL
  Filled 2014-02-05 (×17): qty 2
  Filled 2014-02-05: qty 1
  Filled 2014-02-05 (×17): qty 2

## 2014-02-05 MED ORDER — ACETAMINOPHEN 325 MG PO TABS
325.0000 mg | ORAL_TABLET | ORAL | Status: DC | PRN
  Administered 2014-02-13 – 2014-03-01 (×4): 650 mg via ORAL
  Filled 2014-02-05 (×4): qty 2

## 2014-02-05 MED ORDER — INSULIN ASPART 100 UNIT/ML ~~LOC~~ SOLN
4.0000 [IU] | Freq: Three times a day (TID) | SUBCUTANEOUS | Status: DC
  Administered 2014-02-06 – 2014-02-08 (×4): 4 [IU] via SUBCUTANEOUS

## 2014-02-05 MED ORDER — ONDANSETRON 8 MG/NS 50 ML IVPB: 8.0000 mg | Freq: Four times a day (QID) | INTRAVENOUS | Status: DC | PRN

## 2014-02-05 MED ORDER — DEXTROSE 50 % IV SOLN
INTRAVENOUS | Status: AC
  Filled 2014-02-05: qty 50

## 2014-02-05 MED ORDER — GLUCOSE 40 % PO GEL
1.0000 | Freq: Once | ORAL | Status: AC
  Administered 2014-02-05: 37.5 g via ORAL
  Filled 2014-02-05: qty 1

## 2014-02-05 MED ORDER — COLLAGENASE 250 UNIT/GM EX OINT
TOPICAL_OINTMENT | Freq: Every day | CUTANEOUS | Status: DC
  Administered 2014-02-06 – 2014-02-22 (×17): via TOPICAL
  Filled 2014-02-05: qty 30

## 2014-02-05 MED ORDER — ONDANSETRON HCL 4 MG PO TABS
4.0000 mg | ORAL_TABLET | Freq: Four times a day (QID) | ORAL | Status: DC | PRN
  Administered 2014-02-15: 4 mg via ORAL
  Filled 2014-02-05: qty 1

## 2014-02-05 MED ORDER — INSULIN ASPART 100 UNIT/ML ~~LOC~~ SOLN
0.0000 [IU] | Freq: Three times a day (TID) | SUBCUTANEOUS | Status: DC
  Administered 2014-02-06: 3 [IU] via SUBCUTANEOUS
  Administered 2014-02-07: 7 [IU] via SUBCUTANEOUS
  Administered 2014-02-07 (×2): 5 [IU] via SUBCUTANEOUS
  Administered 2014-02-08: 3 [IU] via SUBCUTANEOUS
  Administered 2014-02-08: 9 [IU] via SUBCUTANEOUS
  Administered 2014-02-08: 5 [IU] via SUBCUTANEOUS
  Administered 2014-02-09 (×2): 3 [IU] via SUBCUTANEOUS
  Administered 2014-02-10: 1 [IU] via SUBCUTANEOUS
  Administered 2014-02-10: 3 [IU] via SUBCUTANEOUS
  Administered 2014-02-10: 7 [IU] via SUBCUTANEOUS
  Administered 2014-02-11: 3 [IU] via SUBCUTANEOUS
  Administered 2014-02-11: 7 [IU] via SUBCUTANEOUS
  Administered 2014-02-11: 5 [IU] via SUBCUTANEOUS
  Administered 2014-02-12: 2 [IU] via SUBCUTANEOUS
  Administered 2014-02-12: 5 [IU] via SUBCUTANEOUS
  Administered 2014-02-12: 7 [IU] via SUBCUTANEOUS
  Administered 2014-02-13 – 2014-02-14 (×2): 2 [IU] via SUBCUTANEOUS
  Administered 2014-02-14: 3 [IU] via SUBCUTANEOUS
  Administered 2014-02-15: 2 [IU] via SUBCUTANEOUS
  Administered 2014-02-16 – 2014-02-17 (×4): 3 [IU] via SUBCUTANEOUS
  Administered 2014-02-17: 9 [IU] via SUBCUTANEOUS
  Administered 2014-02-18: 5 [IU] via SUBCUTANEOUS
  Administered 2014-02-19: 3 [IU] via SUBCUTANEOUS
  Administered 2014-02-19: 1 [IU] via SUBCUTANEOUS
  Administered 2014-02-19: 9 [IU] via SUBCUTANEOUS
  Administered 2014-02-20: 3 [IU] via SUBCUTANEOUS
  Administered 2014-02-20: 2 [IU] via SUBCUTANEOUS
  Administered 2014-02-21: 3 [IU] via SUBCUTANEOUS
  Administered 2014-02-21: 9 [IU] via SUBCUTANEOUS
  Administered 2014-02-21: 3 [IU] via SUBCUTANEOUS
  Administered 2014-02-22: 1 [IU] via SUBCUTANEOUS
  Administered 2014-02-22: 3 [IU] via SUBCUTANEOUS
  Administered 2014-02-23: 7 [IU] via SUBCUTANEOUS

## 2014-02-05 MED ORDER — GUAIFENESIN-DM 100-10 MG/5ML PO SYRP
5.0000 mL | ORAL_SOLUTION | Freq: Four times a day (QID) | ORAL | Status: DC | PRN
  Filled 2014-02-05: qty 10

## 2014-02-05 MED ORDER — INSULIN ASPART 100 UNIT/ML ~~LOC~~ SOLN: 3.0000 [IU] | Freq: Three times a day (TID) | SUBCUTANEOUS | Status: DC

## 2014-02-05 MED ORDER — INSULIN GLARGINE 100 UNIT/ML ~~LOC~~ SOLN
10.0000 [IU] | Freq: Every day | SUBCUTANEOUS | Status: DC
  Administered 2014-02-06: 6 [IU] via SUBCUTANEOUS
  Filled 2014-02-05 (×3): qty 0.1

## 2014-02-05 MED ORDER — PROCHLORPERAZINE EDISYLATE 5 MG/ML IJ SOLN
5.0000 mg | Freq: Four times a day (QID) | INTRAMUSCULAR | Status: DC | PRN
  Filled 2014-02-05: qty 2

## 2014-02-05 MED ORDER — ALBUTEROL SULFATE (2.5 MG/3ML) 0.083% IN NEBU: 2.5000 mg | INHALATION_SOLUTION | RESPIRATORY_TRACT | Status: DC | PRN

## 2014-02-05 MED ORDER — OXYCODONE HCL 5 MG PO TABS: 5.0000 mg | ORAL_TABLET | Freq: Three times a day (TID) | ORAL | Status: DC | PRN

## 2014-02-05 MED ORDER — TRAZODONE HCL 50 MG PO TABS
25.0000 mg | ORAL_TABLET | Freq: Every evening | ORAL | Status: DC | PRN
  Administered 2014-02-06 – 2014-02-15 (×3): 50 mg via ORAL
  Filled 2014-02-05 (×3): qty 1

## 2014-02-05 MED ORDER — PREDNISONE 50 MG PO TABS
50.0000 mg | ORAL_TABLET | Freq: Every day | ORAL | Status: AC
  Administered 2014-02-07: 50 mg via ORAL
  Filled 2014-02-05 (×2): qty 1

## 2014-02-05 MED ORDER — DIPHENHYDRAMINE HCL 12.5 MG/5ML PO ELIX: 12.5000 mg | ORAL_SOLUTION | Freq: Four times a day (QID) | ORAL | Status: DC | PRN

## 2014-02-05 MED ORDER — GABAPENTIN 300 MG PO CAPS
300.0000 mg | ORAL_CAPSULE | Freq: Every day | ORAL | Status: DC
  Administered 2014-02-05 – 2014-02-06 (×2): 300 mg via ORAL
  Filled 2014-02-05 (×3): qty 1

## 2014-02-05 MED ORDER — LORATADINE 10 MG PO TABS
10.0000 mg | ORAL_TABLET | Freq: Every day | ORAL | Status: DC
  Administered 2014-02-06 – 2014-03-05 (×28): 10 mg via ORAL
  Filled 2014-02-05 (×33): qty 1

## 2014-02-05 MED ORDER — MORPHINE SULFATE ER 30 MG PO TBCR
30.0000 mg | EXTENDED_RELEASE_TABLET | Freq: Two times a day (BID) | ORAL | Status: DC
  Administered 2014-02-05 – 2014-02-25 (×40): 30 mg via ORAL
  Filled 2014-02-05 (×40): qty 1

## 2014-02-05 MED ORDER — PREDNISONE 20 MG PO TABS
20.0000 mg | ORAL_TABLET | Freq: Every day | ORAL | Status: AC
  Administered 2014-02-10: 20 mg via ORAL
  Filled 2014-02-05: qty 1

## 2014-02-05 MED ORDER — INSULIN GLARGINE 100 UNIT/ML ~~LOC~~ SOLN
18.0000 [IU] | Freq: Every day | SUBCUTANEOUS | Status: DC
  Filled 2014-02-05: qty 0.18

## 2014-02-05 MED ORDER — PREDNISONE 50 MG PO TABS
60.0000 mg | ORAL_TABLET | Freq: Every day | ORAL | Status: AC
  Administered 2014-02-06: 60 mg via ORAL
  Filled 2014-02-05: qty 1

## 2014-02-05 MED ORDER — PROCHLORPERAZINE 25 MG RE SUPP
12.5000 mg | Freq: Four times a day (QID) | RECTAL | Status: DC | PRN
  Filled 2014-02-05: qty 1

## 2014-02-05 MED ORDER — PROCHLORPERAZINE MALEATE 5 MG PO TABS
5.0000 mg | ORAL_TABLET | Freq: Four times a day (QID) | ORAL | Status: DC | PRN
  Filled 2014-02-05: qty 2

## 2014-02-05 MED ORDER — LIDOCAINE 5 % EX PTCH
1.0000 | MEDICATED_PATCH | CUTANEOUS | Status: DC
  Administered 2014-02-06 – 2014-03-05 (×28): 1 via TRANSDERMAL
  Filled 2014-02-05 (×31): qty 1

## 2014-02-05 MED ORDER — POLYETHYLENE GLYCOL 3350 17 G PO PACK
17.0000 g | PACK | Freq: Every day | ORAL | Status: DC
  Administered 2014-02-06 – 2014-02-08 (×3): 17 g via ORAL
  Filled 2014-02-05 (×4): qty 1

## 2014-02-05 MED ORDER — PREDNISONE 20 MG PO TABS
30.0000 mg | ORAL_TABLET | Freq: Every day | ORAL | Status: AC
  Administered 2014-02-09: 30 mg via ORAL
  Filled 2014-02-05: qty 1

## 2014-02-05 MED ORDER — SIMETHICONE 80 MG PO CHEW
160.0000 mg | CHEWABLE_TABLET | Freq: Four times a day (QID) | ORAL | Status: DC | PRN
  Administered 2014-02-06 – 2014-03-02 (×6): 160 mg via ORAL
  Filled 2014-02-05 (×5): qty 2

## 2014-02-05 MED ORDER — INSULIN ASPART 100 UNIT/ML ~~LOC~~ SOLN: 0.0000 [IU] | Freq: Three times a day (TID) | SUBCUTANEOUS | Status: DC

## 2014-02-05 MED ORDER — PREDNISONE 10 MG PO TABS
10.0000 mg | ORAL_TABLET | Freq: Every day | ORAL | Status: AC
  Administered 2014-02-11: 10 mg via ORAL
  Filled 2014-02-05: qty 1

## 2014-02-05 MED ORDER — CHLORHEXIDINE GLUCONATE 0.12 % MT SOLN
15.0000 mL | Freq: Two times a day (BID) | OROMUCOSAL | Status: DC
Start: 2014-02-05 — End: 2014-02-26
  Administered 2014-02-05 – 2014-02-26 (×37): 15 mL via OROMUCOSAL
  Filled 2014-02-05 (×44): qty 15

## 2014-02-05 MED ORDER — METOCLOPRAMIDE HCL 10 MG/10ML PO SOLN
5.0000 mg | Freq: Three times a day (TID) | ORAL | Status: DC
  Administered 2014-02-05 – 2014-02-22 (×26): 5 mg via ORAL
  Filled 2014-02-05 (×73): qty 5

## 2014-02-05 MED ORDER — ENOXAPARIN SODIUM 40 MG/0.4ML ~~LOC~~ SOLN
40.0000 mg | SUBCUTANEOUS | Status: DC
  Administered 2014-02-05 – 2014-03-04 (×27): 40 mg via SUBCUTANEOUS
  Filled 2014-02-05 (×31): qty 0.4

## 2014-02-05 NOTE — Progress Notes (Addendum)
Inpatient Diabetes Program Recommendations  AACE/ADA: New Consensus Statement on Inpatient Glycemic Control (2013)  Target Ranges:  Prepandial:   less than 140 mg/dL      Peak postprandial:   less than 180 mg/dL (1-2 hours)      Critically ill patients:  140 - 180 mg/dL   Reason for Assessment: Results for CARTER, KASSEL (MRN [id number]) as of 02/05/2014 11:03  Ref. Range 02/05/2014 05:34 02/05/2014 08:22 02/05/2014 09:58 02/05/2014 10:01 02/05/2014 10:39  Glucose-Capillary Latest Range: 70-99 mg/dL 73 41 (LL) 32 (LL) 43 (LL) 190 (H)   Diabetes history: Type 1 Outpatient Diabetes medications: Lantus 20 units daily, Novolog with meals Current orders for Inpatient glycemic control: Novolog resistant tid with meals, Patient received Lantus 26 units bid yesterday.  Note that patient was receiving high doses of steroids however they have now been stopped.   Once CBG's consistently greater than 150 mg/dL, consider resumption of Lantus 24 units at bedtime, Novolog sensitive correction and Novolog 4 units tid (meal coverage).  Called and discussed with RN.   Adah Perl, RN, BC-ADM Inpatient Diabetes Coordinator Pager [phone]

## 2014-02-05 NOTE — Progress Notes (Signed)
I await medical clearance to admit pt to inpt rehab today vs next week. I have discussed with Dr. Naaman Plummer as well as Dr. Thereasa Solo. I will follow up today. [phone]

## 2014-02-05 NOTE — Progress Notes (Signed)
I have met with pt, his Mom , and Aunts at bedside. All in agreement to admit to inpt rehab today. I have discussed with Dr. Thereasa Solo and Dr. Naaman Plummer. I will make arrangements to admit today. I contacted [name], his fiance by phone and she is aware. [phone]

## 2014-02-05 NOTE — Progress Notes (Signed)
PMR Admission Coordinator Pre-Admission Assessment  Patient: Jason [name] is an 28 y.o., male  MRN: [id number]  DOB: Jul 14, 1986  Height: 5' 7.5" (171.5 cm)  Weight: 67.9 kg (149 lb 11.1 oz)  Insurance Information  HMO: PPO: PCP: IPA: 80/20: OTHER:  PRIMARY: self pay Policy#: Subscriber:  Pt states he has two medicaid numbers that have to be merged at the state level. One just approved but no card yet. Approved in the past month. Toniann Ket with financial counseling is involved in his case. [id number] Jerilynn Mages is old number for medicaid.  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 male with h/o DM type 1, diabetic neuropathy with sensory deficits, as well as BLE diabetic ulcers x 4 month, h/o left elbow MRSA boil as well as gastroparesis who was admitted on 01/29/14 with lethargy, BLE weakness and inability to ambulate. He was found to have bilateral LE cellulitis, possible PNA, Acute on chronic renal injury, leucocytosis with WBC 32k as well as hyperglycemia. He was started on IV antibiotics as well as insulin drip.  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 Patient has worsening  of MS with progressive BUE weakness on 01/20/14 due to SIRS/sepsis and treatment broadened with addition of fluconazole and acyclovir per ID input. LP with elevated glucose-132 and protein-111 and cultures negative so far. He was started on IV solumedrol. Per Dr. Nicole Kindred with neurology on 02/05/14, Myelopathy extending from lower cervical to midthoracic level most likely secondary to ischemic lesion. There were no indications of Guillain-Barr syndrome. MRI findings as well as lumbar puncture results were more supportive of cord infarction than of transverse myelitis.  Antibiotics de-escalated and Dr. Tommy Medal recommends 30 day course of doxycyline for chronic non-healing ulcers BLE with persistently elevated ESR as well as ABI at some course during his stay.  WOC consulted for input on BLE ulcers and feels likely vasculitic in nature--santyl added for chemical debridement.   Past Medical History  Past Medical History   Diagnosis  Date   .  Diabetes mellitus    .  GERD (gastroesophageal reflux disease)    .  Asthma    .  Hx MRSA infection      on face   .  Gastroparesis    .  Diabetic neuropathy    .  Seizures     Family History  family history includes Asthma in an other family member; Diabetes in his father; Heart disease in his mother; Hypertension in his father and another family member; Stroke in an other family member.  Prior Rehab/Hospitalizations: none  Current Medications  Current facility-administered medications:acetaminophen (TYLENOL) suppository 650 mg, 650 mg, Rectal, Q6H PRN, Berle Mull, MD;  acetaminophen (TYLENOL) tablet 650 mg, 650 mg, Oral, Q6H PRN, Berle Mull, MD; albuterol (PROVENTIL) (2.5 MG/3ML) 0.083% nebulizer solution 2.5 mg, 2.5 mg, Nebulization, Q2H PRN, Cherene Altes, MD; chlorhexidine (PERIDEX) 0.12 % solution 15 mL, 15 mL, Mouth Rinse, BID, Rush Farmer, MD, 15 mL at 02/05/14 0940  collagenase (SANTYL) ointment, , Topical, Daily, Rush Farmer, MD; dextrose  50 % solution, , , , ; doxycycline (VIBRA-TABS) tablet 100 mg, 100 mg, Oral, Q12H, Cherene Altes, MD, 100 mg at 02/05/14 0940; enoxaparin (LOVENOX) injection 40 mg, 40 mg, Subcutaneous, Q24H, Allie Bossier, MD, 40 mg at 02/04/14 2243; fluconazole (DIFLUCAN) tablet 100 mg, 100 mg, Oral, Daily, Cherene Altes, MD, 100 mg at 02/05/14 0940  gabapentin (NEURONTIN) capsule 300 mg, 300 mg, Oral, QHS, Allie Bossier, MD, 300 mg at 02/04/14 2246; insulin aspart (novoLOG) injection 0-5 Units, 0-5 Units, Subcutaneous, QHS, Cherene Altes, MD; insulin aspart (novoLOG) injection 0-9 Units, 0-9 Units, Subcutaneous, TID WC, Cherene Altes, MD; lidocaine (LIDODERM) 5 % 1 patch, 1 patch, Transdermal, Q24H, Berle Mull, MD, 1 patch at 02/05/14 0942  loratadine (CLARITIN) tablet 10 mg, 10 mg, Oral, Daily, Raylene Miyamoto, MD, 10 mg at 02/05/14 0940; methocarbamol (ROBAXIN) 500 mg in dextrose 5 % 50 mL IVPB, 500 mg, Intravenous, Q8H PRN, Melvia Heaps, MD, 500 mg at 02/01/14 9357; metoCLOPramide (REGLAN) 10 MG/10ML solution 10 mg, 10 mg, Oral, TID PRN, Raylene Miyamoto, MD; morphine (MS CONTIN) 12 hr tablet 30 mg, 30 mg, Oral, Q12H, Melvia Heaps, MD, 30 mg at 02/05/14 0940  ondansetron (ZOFRAN) 8 mg/NS 50 ml IVPB, 8 mg, Intravenous, Q6H PRN, Berle Mull, MD; ondansetron (ZOFRAN) tablet 4 mg, 4 mg, Oral, Q6H PRN, Berle Mull, MD; oxyCODONE (Oxy IR/ROXICODONE) immediate release tablet 5-10 mg, 5-10 mg, Oral, Q8H PRN, Cherene Altes, MD; polyethylene glycol (MIRALAX / GLYCOLAX) packet 17 g, 17 g, Oral, Daily PRN, Melvia Heaps, MD  simethicone [facility]) chewable tablet 160 mg, 160 mg, Oral, QID PRN, Rhetta Mura Schorr, NP; sodium chloride 0.9 % injection 3 mL, 3 mL, Intravenous, Q12H, Berle Mull, MD, 3 mL at 02/04/14 1049  Patients Current Diet: Carb Control  Precautions / Restrictions  Precautions  Precautions: Fall  Precaution Comments: high risk for skin breakdown  Other Brace/Splint: B prevalon  boots  Restrictions  Weight Bearing Restrictions: No  Prior Activity Level  Community (5-7x/wk): 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 Pt not a morning person. [name] usually wakes him up 9 am and he eats breakfast to prevent hypoglycemia and then he returns to bed. Then gets up about 1 or 2 pm. If he has a job to do with his Dad as a repo job, he gets up early in the morning to do the job. Pt has Diabetic neuropathy that effects his ability to walk a couple of time every two months. [name] then carries him on her back around their home. She is 4'10 and his petitie in stature. [name] is aware from me on 5/1 that pt likely wheelchair level at d/c. Pt's Mom says that if [name] can not handle it, Mom will bring him to her home. Patient has very supportive Aunts but they will not provide the physical care. [name] has bathed him when needed.  Home Assistive Devices / Equipment  Home Assistive Devices/Equipment: CBG Meter  Prior Functional Level  Prior Function  Level of Independence: Independent  Comments: pending disability due to his diabetes  pta  Current Functional Level  Cognition  Overall Cognitive Status: Within Functional Limits for tasks assessed  Orientation Level: Oriented X4  General Comments: Pt provided instructions from Rn and 30 seconds later patient asking the same question. Pt with no recall of previous information   Extremity Assessment  (includes Sensation/Coordination)     ADLs  Overall ADL's : Needs assistance/impaired  Eating/Feeding: Maximal assistance;Bed level;With adaptive utensils  Eating/Feeding Details (indicate cue type and reason): pt using Lt hand to self feed grits with built up red handled spoon  Grooming: Moderate assistance;Bed level  Grooming Details (indicate cue type and reason): wash cloth placed in Lt UE and pt able to rub LT UE against face this session and able to use shoulder flexion/ extension to rack digits on head to scratch.  Upper Body  Bathing: Total assistance;Bed level  Upper Body Bathing Details (indicate cue type and reason): pt able to AROM shoulder flexion for underarm bathing and deordorant  Lower Body Bathing: Total assistance;Bed level  Upper Body Dressing : Total assistance  Toilet Transfer: +2 for physical assistance;Total assistance  Toilet Transfer Details (indicate cue type and reason): transfer EOB to chair with bil LE blocked  Functional mobility during ADLs: (tranfer only total +2 Total)  General ADL Comments: Pt needed max motivation this session and educated on d/c recommendations pending evaluations. pt closing eyes 80% of session. pt s mother and fiance present to help motivate patietn to participate today. pt unable to verbalize reason for decr motivation. pt reports pain "all over" with questioning cues not defining. Ot unable to gain clear understanding of limitations this session questions depression?? frustration with slow recovery process?? Pt reports bed surface being more comfortable now on mattress overlay.   Mobility  Overal bed mobility: Needs Assistance;+2 for physical assistance  Bed Mobility: Rolling  Rolling: Total assist;+2 for physical assistance  Sidelying to sit: Total assist  Supine to sit: Total assist;+2 for physical assistance;HOB elevated  Sit to supine: Total assist;+2 for physical assistance  General bed mobility comments: rolling Rt and Lt with cues to use head/neck flexion and rotation with reaching UEs across to assist. Pt able to rotate his head, however reports unable to flex head/chin to chest due to pain between his shoulders   Transfers  Overall transfer level: Needs assistance  Equipment used: None (maximove lift)  Transfers: Squat Pivot Transfers  Sit to Stand: Total assist;+2 physical assistance  Stand pivot transfers: Total assist;+2 physical assistance  Squat pivot transfers: Total assist;+2 physical assistance  General transfer comment: maximove bed to tilt-n-space  wheelchair   Ambulation / Gait / Stairs / Landscape architect Details (indicate cue type and reason): Mliss Sax, Therapist, sports and mother educated on releasing lateral trunk supports prior to lifting pt out of chair to bed, need for pressure relief every 30 minutes for at least 1 minute, and how to use tilt-in-space wheelchair to perform pressure relief. Mom able to raise and lower chair for this purpose. Educated that if pt does not go to [employer] today, nursing can lift him into wheelchair each day.   Posture / Balance  Dynamic Sitting Balance  Sitting balance - Comments: Was able to sit EOB ~8 minutes, with cueing for anterior lean and UE use on bed to support himself. Signficant posterior assist required to keep pt from falling backwards.   Special needs/care consideration  Special Bed overlay mattress  Skin WOC 01/31/14 Location    WOC wound consult note  Reason for Consult: Assessment and suggestions for management of LE ulcerations  Wound type:Vasculitic, atypical  Pressure Ulcer POA: No  Measurement:Right posterior hallux: 1.5cm x 1cm with depth obscured by the presence of white necrotic tissue. Left posterior hallux: 1cm x 1cm with depth obscured by the presence of white necrotic tissue. Right pretibial LE: 1cm round x 0.2cm full thickness wound with 90% red wound bed and 10% yellow slough at periphery. Left pretibial area with two ulcerations, both full thickness. Proximal: 4.5cm x 4.5cm x 0.2cm and moist pink wound bed with evidence of previous wound healing (redeposition of melanin/pigment into tissues), Distal: 2cm x 2cm x 0.2cm with moist pink wound bed.  Wound bed: As described above.  Drainage (amount, consistency, odor) moderate amount of serosanguinous exudate on old dressing, that had adhered. Faint odor.  Periwound: Intact, dry. Hemosiderin staining on pretibial areas of LEs, L>R.  Dressing procedure/placement/frequency: I will provide guidance for  care of bilateral LE (pretibial) ulcerations that are full thickness and appear to be vasculitic in nature albeit not classic or difinitive. While there is hemosiderin staining (L>R) and this is typically indicative of venous insufficiency, the ulcerations are more punctate and indicative of arterial insufficiency. Given the patient's age and history, neither etiology is probable. The goal nonetheless is clear: support with moisture retentive dressings to autolytically debride and enhance reepithelialization. Calcium alginate dressings are applied daily to absorb exudate, prevent traumatic removal and enhance reepithelialization. The bilateral hallux ulcerations (full thickness, posterior) are similar in size and the depth is obscured by the presence of necrotic tissue. I have provided for enzymatic debridement using collagenase (Santyl) to those two areas. As a pressure ulcer preventative measure I have provided bilateral pressure redistribution boots (Prevalon) to support the efforts of the nursing staff.  [facility] nursing team will not follow, but will remain available to this patient, the nursing and medical teams. Please re-consult if needed.  Thanks,  Maudie Flakes, MSN, RN, [place], [place], CWON-AP [phone])       Bowel mgmt: NO BM. I have requested suppository 5/1 to RN on 2C  Bladder mgmt: foley  Diabetic mgmt yes IDDM since he was 28 years old. He has had difficulty manageing due to no insurance. PCP Dr. Sharlene Motts  Chronic Pain management due to DM neuropathy   Previous Home Environment  Living Arrangements: Spouse/significant other;Children;Other (Comment) (lives with fiance for 6 years with her 3 children 30, 79 and )  Lives With: Significant other;Other (Comment) (has lived with [place] for 6 years in [facility])  Available Help at Discharge: Family;Available 24 hours/day  Type of Home: House  Home Layout: One level  Home Access: Level entry  Bathroom Shower/Tub: Administrator, Civil Service:  Standard  Bathroom Accessibility: (dont' know accessibility )  Home Care Services: No  Additional Comments: Able to provide 24/7 S after D/C. 1 level home. level entry. bathroom not accessible  Discharge Living Setting  Plans for Discharge Living Setting: Patient's home;Lives with (comment);Other (Comment) (fiance , [name], for 6 years with her 3 children in [place])  Type of Home at Discharge: House  Discharge Home Layout: One level  Discharge Home Access: Level entry  Discharge Bathroom Shower/Tub: Tub/shower unit  Discharge Bathroom Toilet: Standard  Does the patient have any problems obtaining your medications?: Yes (Describe) (Medicaid pending approval per financial counselor)  Social/Family/Support Systems  Patient Roles: Partner;Parent;Other (Comment) (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)  Sport and exercise psychologist Information: [name], fiance; [place], [place]; and Dad, is Timmothy Sours  Anticipated Caregiver: [name] with assist of pt's Mom, [name]  Anticipated Caregiver's Contact Information: see above  Ability/Limitations of Caregiver: [name] unemployed with 3 kids 8, 9 and 6  Caregiver Availability: 24/7  Discharge Plan Discussed with Primary Caregiver: Yes  Is Caregiver In Agreement with Plan?: Yes  Does Caregiver/Family have Issues with Lodging/Transportation while Pt is in Rehab?: No (Mom has been staying with pt 24/7; [name] in [place] wi)  Goals/Additional Needs  Patient/Family Goal for Rehab: Mod assist with PT and OT wheelchair level  Expected length of stay: [facility] 4 weeks  Dietary Needs: Carb modified  Equipment Needs: wheelchair accessible equipment for home; touch lite call bell system in hospital; specialty bed with overaly  Special Service Needs: New diagnosis for spinal cord infarct this admission and pt was told with his Mom on 02/05/14 before admission to inpt rehab  Pt/Family Agrees to Admission and willing to participate: Yes  Program Orientation Provided & Reviewed with Pt/Caregiver  Including Roles & Responsibilities: Yes  Decrease burden of Care through IP rehab admission: pt and family education, one caregiver support, equipment acquisition,bowel and bladder management  Possible need for SNF placement upon discharge: not anticipated. If fiance can not manage, Mom states she will provide for him in her home.  Patient Condition: This patient's medical and functional status has changed since the consult dated: 02/03/14 in which the Rehabilitation Physician determined and documented that the patient's condition is appropriate for intensive rehabilitative care in an inpatient rehabilitation facility. See "History of Present Illness" (above) for medical update. Functional changes are: overall max to total assist. Patient's medical and functional status update has been discussed with the Rehabilitation physician and patient remains appropriate for inpatient rehabilitation. Will admit to inpatient rehab today.  Preadmission Screen Completed By: Cleatrice Burke, 02/05/2014 3:20 PM  ______________________________________________________________________  Discussed status with Dr. Naaman Plummer on 02/05/14 at 1520 and received telephone approval for admission today.  Admission Coordinator: Cleatrice Burke, time 1520 Date 02/05/14.  Cosigned by: Meredith Staggers, MD [02/05/2014 3:54 PM]

## 2014-02-05 NOTE — Progress Notes (Signed)
Pt does not wish to wear CPAP tonight. RT attempted to place pt on mask but pt was unable to tolerate. Pt encouraged to call RT if pt changes mind.

## 2014-02-05 NOTE — Progress Notes (Signed)
Subjective: Patient was drowsy and complained of feeling weak. He had a low blood sugar space (41) earlier today. He has not experienced a change in strength in the upper or lower extremities over the past 24 hours.  Objective: Current vital signs: BP 149/88  Pulse 83  Temp(Src) 98.3 F (36.8 C) (Axillary)  Resp 15  Ht 5' 7.5" (1.715 m)  Wt 67.9 kg (149 lb 11.1 oz)  BMI 23.09 kg/m2  SpO2 99%  Neurologic Exam: [name] but easy to arouse. Patient follow commands well. Extraocular movements were full and conjugate. Face was symmetrical with no weakness. Upper extremity strength was unchanged with bilateral triceps weakness, left greater than right, as well as marked intrinsic hand muscle weakness bilaterally. 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  Medications: I have reviewed the patient's current medications.  Assessment/Plan: Myelopathy extending from lower cervical to midthoracic level most likely secondary to ischemic lesion. There were no indications of Guillain-Barr syndrome. MRI findings as well as lumbar puncture results were more supportive of cord infarction than of transverse myelitis.  Recommending no changes in current management, including steroid taper and inpatient rehabilitation when medically stable.  We will continue to follow this patient with you.  C.R. Nicole Kindred, MD [facility] [phone]  02/05/2014  10:01 AM

## 2014-02-05 NOTE — PMR Pre-admission (Signed)
PMR Admission Coordinator Pre-Admission Assessment  Patient: Jason [name] is an 28 y.o., male MRN: [id number] DOB: 01/19/1986 Height: 5' 7.5" (171.5 cm) Weight: 67.9 kg (149 lb 11.1 oz)              Insurance Information HMO:     PPO:      PCP:      IPA:      80/20:      OTHER:  PRIMARY: self pay      Policy#:      Subscriber:  Pt states he has two medicaid numbers that have to be merged at the state level. One just approved but no card yet. Approved in the past month. Jason [name] with financial counseling is involved in his case. [id number] Jerilynn Mages is old number for medicaid.    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 male with h/o DM type 1, diabetic neuropathy with sensory deficits, as well as BLE diabetic ulcers x 4 month, h/o left elbow MRSA boil as well as gastroparesis who was admitted on 01/29/14 with lethargy, BLE weakness and inability to ambulate. He was found to have bilateral LE cellulitis, possible PNA, Acute on chronic renal injury, leucocytosis with WBC 32k as well as hyperglycemia. He was started on IV antibiotics as well as insulin drip.   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 Patient has worsening of MS with progressive BUE weakness on 01/20/14 due to SIRS/sepsis and treatment broadened with addition of fluconazole and acyclovir per ID input. LP with elevated glucose-132 and protein-111 and cultures negative so far. He was started on IV solumedrol. Per Dr. Nicole Kindred with neurology on 02/05/14, Myelopathy extending from lower cervical to midthoracic level most likely secondary to ischemic lesion. There were no indications of Guillain-Barr syndrome. MRI findings as well as lumbar puncture results were more supportive of cord infarction than of transverse myelitis.   Antibiotics de-escalated and Dr. Tommy Medal recommends 30 day course of doxycyline for chronic non-healing ulcers BLE with persistently elevated ESR as well as ABI at some course during his stay.   WOC consulted for input on BLE ulcers and feels likely vasculitic in nature--santyl added for chemical debridement.     Past Medical History  Past Medical History  Diagnosis Date  . Diabetes mellitus   . GERD (gastroesophageal reflux disease)   . Asthma   . Hx MRSA infection     on face  . Gastroparesis   . Diabetic neuropathy   . Seizures     Family History  family history includes Asthma in an other family member; Diabetes in his father; Heart disease in his mother; Hypertension in his father and another family member; Stroke in an  other family member.  Prior Rehab/Hospitalizations: none  Current Medications  Current facility-administered medications:acetaminophen (TYLENOL) suppository 650 mg, 650 mg, Rectal, Q6H PRN, Jason Mull, MD;  acetaminophen (TYLENOL) tablet 650 mg, 650 mg, Oral, Q6H PRN, Jason Mull, MD;  albuterol (PROVENTIL) (2.5 MG/3ML) 0.083% nebulizer solution 2.5 mg, 2.5 mg, Nebulization, Q2H PRN, Jason Altes, MD;  chlorhexidine (PERIDEX) 0.12 % solution 15 mL, 15 mL, Mouth Rinse, BID, Jason Farmer, MD, 15 mL at 02/05/14 0940 collagenase (SANTYL) ointment, ,  Topical, Daily, Jason Farmer, MD;  dextrose 50 % solution, , , , ;  doxycycline (VIBRA-TABS) tablet 100 mg, 100 mg, Oral, Q12H, Jason Altes, MD, 100 mg at 02/05/14 0940;  enoxaparin (LOVENOX) injection 40 mg, 40 mg, Subcutaneous, Q24H, Jason Bossier, MD, 40 mg at 02/04/14 2243;  fluconazole (DIFLUCAN) tablet 100 mg, 100 mg, Oral, Daily, Jason Altes, MD, 100 mg at 02/05/14 0940 gabapentin (NEURONTIN) capsule 300 mg, 300 mg, Oral, QHS, Jason Bossier, MD, 300 mg at 02/04/14 2246;  insulin aspart (novoLOG) injection 0-5 Units, 0-5 Units, Subcutaneous, QHS, Jason Altes, MD;  insulin aspart (novoLOG) injection 0-9 Units, 0-9 Units, Subcutaneous, TID WC, Jason Altes, MD;  lidocaine (LIDODERM) 5 % 1 patch, 1 patch, Transdermal, Q24H, Jason Mull, MD, 1 patch at 02/05/14 0942 loratadine (CLARITIN) tablet 10 mg, 10 mg, Oral, Daily, Jason Miyamoto, MD, 10 mg at 02/05/14 0940;  methocarbamol (ROBAXIN) 500 mg in dextrose 5 % 50 mL IVPB, 500 mg, Intravenous, Q8H PRN, Jason Heaps, MD, 500 mg at 02/01/14 3007;  metoCLOPramide (REGLAN) 10 MG/10ML solution 10 mg, 10 mg, Oral, TID PRN, Jason Miyamoto, MD;  morphine (MS CONTIN) 12 hr tablet 30 mg, 30 mg, Oral, Q12H, Jason Heaps, MD, 30 mg at 02/05/14 0940 ondansetron (ZOFRAN) 8 mg/NS 50 ml IVPB, 8 mg, Intravenous, Q6H PRN, Jason Mull, MD;  ondansetron (ZOFRAN) tablet 4 mg, 4 mg, Oral, Q6H PRN, Jason Mull, MD;  oxyCODONE (Oxy IR/ROXICODONE) immediate release tablet 5-10 mg, 5-10 mg, Oral, Q8H PRN, Jason Altes, MD;  polyethylene glycol (MIRALAX / GLYCOLAX) packet 17 g, 17 g, Oral, Daily PRN, Jason Heaps, MD simethicone [facility]) chewable tablet 160 mg, 160 mg, Oral, QID PRN, Jason Mura Schorr, NP;  sodium chloride 0.9 % injection 3 mL, 3 mL, Intravenous, Q12H, Jason Mull, MD, 3 mL at 02/04/14 1049  Patients Current Diet: Carb Control  Precautions / Restrictions Precautions Precautions: Fall Precaution Comments: high risk  for skin breakdown Other Brace/Splint: B prevalon boots Restrictions Weight Bearing Restrictions: No   Prior Activity Level Community (5-7x/wk): 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 Pt not a morning person. Jason [name] usually wakes him up 9 am and he eats breakfast to prevent hypoglycemia and then he returns to bed. Then gets up about 1 or 2 pm. If he has a job to do with his Dad as a repo job, he gets up early in the morning to do the job. Pt has Diabetic neuropathy that effects his ability to walk a couple of time every two months. Jason [name] then carries him on her back around their home. She is 4'10 and his petitie in stature. Jason [name] is aware from me on 5/1 that pt likely wheelchair level at d/c. Pt's Mom says that if Jason [name] can not handle it, Mom will bring him to her home. Patient has very supportive Aunts but they will not provide the physical care. Jason [name] has bathed him when needed.  Home Assistive Devices / Equipment Home Assistive Devices/Equipment: CBG Meter  Prior Functional Level Prior Function Level of Independence: Independent Comments: pending disability due to his diabetes pta  Current Functional Level Cognition  Overall Cognitive Status: Within Functional Limits for tasks assessed Orientation Level: Oriented X4 General Comments: Pt provided instructions from Rn and 30 seconds later patient asking the same question. Pt with no recall of previous information    Extremity Assessment (includes Sensation/Coordination)          ADLs  Overall ADL's : Needs assistance/impaired Eating/Feeding: Maximal assistance;Bed level;With adaptive utensils Eating/Feeding Details (indicate cue type and reason): pt using Lt hand to self feed grits with built up red handled spoon Grooming: Moderate assistance;Bed level Grooming Details (indicate cue type and reason): wash cloth placed in Lt UE and pt able to rub LT UE against face this session and able to use shoulder flexion/ extension  to rack digits on head to scratch.  Upper Body Bathing: Total assistance;Bed level Upper Body Bathing Details (indicate cue type and reason): pt able to AROM shoulder flexion for underarm bathing and deordorant Lower Body Bathing: Total assistance;Bed level Upper Body Dressing : Total assistance Toilet Transfer: +2 for physical assistance;Total assistance Toilet Transfer Details (indicate cue type and reason): transfer EOB to chair with bil LE blocked Functional mobility during ADLs:  (tranfer only total +2 Total) General ADL Comments: Pt needed max motivation this session and educated on d/c recommendations pending evaluations. pt closing eyes 80% of session. pt s mother and fiance present to help motivate patietn to participate today. pt unable to verbalize reason for decr motivation. pt reports pain "all over" with questioning cues not defining. Ot unable to gain clear understanding of limitations this session questions depression?? frustration with slow recovery process?? Pt reports bed surface being more comfortable now on mattress overlay.    Mobility  Overal bed mobility: Needs Assistance;+2 for physical assistance Bed Mobility: Rolling Rolling: Total assist;+2 for physical assistance Sidelying to sit: Total assist Supine to sit: Total assist;+2 for physical assistance;HOB elevated Sit to supine: Total assist;+2 for physical assistance General bed mobility comments: rolling Rt and Lt with cues to use head/neck flexion and rotation with reaching UEs across to assist. Pt able to rotate his head, however reports unable to flex head/chin to chest due to pain between his shoulders    Transfers  Overall transfer level: Needs assistance Equipment used: None (maximove lift) Transfers: Squat Pivot Transfers Sit to Stand: Total assist;+2 physical assistance Stand pivot transfers: Total assist;+2 physical assistance Squat pivot transfers: Total assist;+2 physical assistance General transfer  comment: maximove bed to tilt-n-space wheelchair    Ambulation / Gait / Stairs / Product/process development scientist Details (indicate cue type and reason): Mliss Sax, Therapist, sports and mother educated on releasing lateral trunk supports prior to lifting pt out of chair to bed, need for pressure relief every 30 minutes for at least 1 minute, and how to use tilt-in-space wheelchair to perform pressure relief. Mom able to raise and lower chair for this purpose. Educated that if pt does not go to [employer] today, nursing can lift him into wheelchair each day.    Posture / Balance Dynamic Sitting Balance Sitting balance - Comments: Was able to sit EOB ~8 minutes, with cueing for anterior lean and UE use on bed to support himself. Signficant posterior assist required to keep pt from falling backwards.    Special needs/care consideration Special Bed overlay mattress Skin WOC 01/31/14  Location  WOC wound consult note  Reason for Consult: Assessment and suggestions for management of LE ulcerations  Wound type:Vasculitic, atypical  Pressure Ulcer POA: No  Measurement:Right posterior hallux: 1.5cm x 1cm with depth obscured by the presence of white necrotic tissue. Left posterior hallux: 1cm x 1cm with depth obscured by the presence of white necrotic tissue. Right pretibial LE: 1cm round x 0.2cm full thickness wound with 90% red wound bed and 10% yellow slough at periphery. Left pretibial area with two ulcerations, both full thickness. Proximal: 4.5cm x 4.5cm x 0.2cm and moist pink wound bed with evidence of previous wound healing (redeposition of melanin/pigment into tissues), Distal: 2cm x 2cm x 0.2cm with moist pink wound bed.  Wound bed: As described above.  Drainage (amount, consistency, odor) moderate amount of serosanguinous exudate on old dressing, that had adhered. Faint odor.  Periwound: Intact, dry. Hemosiderin staining on pretibial areas of LEs, L>R.  Dressing  procedure/placement/frequency: I will provide guidance for care of bilateral LE (pretibial) ulcerations that are full thickness and appear to be vasculitic in nature albeit not classic or difinitive. While there is hemosiderin staining (L>R) and this is typically indicative of venous insufficiency, the ulcerations are more punctate and indicative of arterial insufficiency. Given the patient's age and history, neither etiology is probable. The goal nonetheless is clear: support with moisture retentive dressings to autolytically debride and enhance reepithelialization. Calcium alginate dressings are applied daily to absorb exudate, prevent traumatic removal and enhance reepithelialization. The bilateral hallux ulcerations (full thickness, posterior) are similar in size and the depth is obscured by the presence of necrotic tissue. I have provided for enzymatic debridement using collagenase (Santyl) to those two areas. As a pressure ulcer preventative measure I have provided bilateral pressure redistribution boots (Prevalon) to support the efforts of the nursing staff.  [facility] nursing team will not follow, but will remain available to this patient, the nursing and medical teams. Please re-consult if needed.  Thanks,  Maudie Flakes, MSN, RN, [place], [place], CWON-AP [phone])      Bowel mgmt: NO BM. I have requested suppository 5/1 to RN on 2C Bladder mgmt: foley Diabetic mgmt yes IDDM since he was 28 years old. He has had difficulty manageing due to no insurance. PCP Dr. Sharlene Motts Chronic Pain management due to DM neuropathy   Previous Home Environment Living Arrangements: Spouse/significant other;Children;Other (Comment) (lives with fiance for 6 years with her 3 children 92, 55 and )  Lives With: Significant other;Other (Comment) (has lived with [place] for 6 years in [facility]) Available Help at Discharge: Family;Available 24 hours/day Type of Home: House Home Layout: One level Home Access: Level  entry Bathroom Shower/Tub: Chiropodist: Standard Bathroom Accessibility:  (dont' know accessibility ) Home Care Services: No Additional Comments: Able to provide 24/7 S after D/C. 1 level home. level entry. 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 Information: [place], fiance; [place], [place]; and Dad, is Timmothy Sours Anticipated Caregiver: Jason [name] with assist of pt's Mom, Jason [name] Anticipated Caregiver's Contact Information: see above Ability/Limitations of  Caregiver: Jason [name] unemployed with 3 kids 27, 9 and 6 Caregiver Availability: 24/7 Discharge Plan Discussed with Primary Caregiver: Yes Is Caregiver In Agreement with Plan?: Yes Does Caregiver/Family have Issues with Lodging/Transportation while Pt is in Rehab?: No (Mom has been staying with pt 24/7; Jason [name] in [employer] [place])    Goals/Additional Needs Patient/Family Goal for Rehab: Mod assist with PT and OT wheelchair level Expected length of stay: ELOS 4 weeks Dietary Needs: Carb modified Equipment Needs: wheelchair accessible equipment for home; touch lite call bell system in hospital; specialty bed with overaly  Special Service Needs: New diagnosis for spinal cord infarct this admission and pt was told with his Mom on 02/05/14 before admission to inpt rehab Pt/Family Agrees to Admission and willing to participate: Yes Program Orientation  Provided & Reviewed with Pt/Caregiver Including Roles  & Responsibilities: Yes   Decrease burden of Care through IP rehab admission: pt and family education, one caregiver support, equipment acquisition,bowel and bladder management  Possible need for SNF placement upon discharge: not anticipated. If fiance can not manage, Mom states she will provide for him in her home.  Patient Condition: This patient's medical and functional status has changed since the consult dated: 02/03/14 in which the Rehabilitation Physician determined and documented that the patient's condition is appropriate for intensive rehabilitative care in an inpatient rehabilitation facility. See "History of Present Illness" (above) for medical update. Functional changes are: overall max to total assist. Patient's medical and functional status update has been discussed with the Rehabilitation physician and patient remains appropriate for inpatient rehabilitation. Will admit to inpatient rehab today.  Preadmission Screen Completed By:  Cleatrice Burke, 02/05/2014 3:20 PM ______________________________________________________________________   Discussed status with Dr. Naaman Plummer on 02/05/14 at  1520 and received telephone approval for admission today.  Admission Coordinator:  Cleatrice Burke, time 1520 Date 02/05/14.

## 2014-02-05 NOTE — Discharge Summary (Signed)
DISCHARGE SUMMARY  Jason [name]  MR#: [id number]  DOB:1985-11-22  Date of Admission: 01/29/2014 Date of Discharge: 02/05/2014  Attending Physician:Hurley Sobel T Haidynn Almendarez  Patient's PCP:FRY,STEPHEN A, MD  Consults: Neurology ID  Disposition: D/C to CIR   Discharge Diagnoses: Transverse myelitis v/s "ischemic lesion"   Diabetes uncontrolled  Diabetic neuropathy  Diabetic gastroparesis  Diabetic foot ulcers  Chronic pain syndrome  Thrush  Initial presentation: 28 y.o. M w/ Hx diabetes, diabetic neuropathy, gastroparesis, GERD, and diabetic ulcers. The patient was brought in by his family 4/24. The patient went to work and drove his car in the morning. In the afternoon at around 4:00 the patient went to sleep and when the significant other tried to wake the patient up at 8 PM the patient was complaining that he could not feel his legs and could not move his legs. Patient has history of diabetic neuropathy due to which he does not have significant sensation in his legs. Patient went to sleep again and around midnight when significant other checked his sugar it was registered as "high". She gave him some insulin and in the morning when the patient woke up he continued to complain of numbness and weakness in his legs with inability to move. EMS was then summoned.  There was no trauma no fall no fever no chills no cough no chest pain no shortness of breath reported. There was no diarrhea or constipation no abdominal pain no nausea no vomiting reported.   Hospital Course:  Transverse myelitis v/s "ischemic lesion"  -Per Neurology  -has completed course of methylprednisolone 1000 mg daily x ~5 days  -weakness of triceps and intrinsic hand muscles as well as paralysis of lower extremities persists  -f/u MRI 4/30 reportedly revealed findings more c/w ishcemic event affecting spinal cord, per Neurology notes - formal reading not available at time of d/c - rehab potential felt to remain, but tx  course will likely be longer    Diabetes uncontrolled  -CBG proved to be erratic with use of and then d/c of high dose steroids - some hypoglycemia encountered on morning of d/c, but this quickly rebounded to hyperglycemia later in the day - cont to follow CBGs regularly, and utilize SSI   -HgA1c 12.4   Diabetic neuropathy  -Neurontin 300 mg each bedtime   Diabetic gastroparesis  -Continue metoclopramide 10 mg TID   Diabetic foot ulcers  -ABIs were requested during acute hospitalization, but had not yet been accomplished - perhaps these could be completed in CIR - pt to complete 30 days of doxy tx per ID suggestion to assure osteo of LE does not develop   Chronic pain syndrome  Secondary to diabetic neuropathy; narcotics do poorly against this type of pain - attempting to de-escalate narcotic use but will allow prn dosing with therapy which is painful to pt  -Continue morphine 30 mg BID  -Neurontin 300 mg each bedtime  -Continue Robaxin 500 mg TID   Thrush -to complete 14 day tx course of diflucan     D/C Medications to be determined at time of D/C from CIR    Day of Discharge BP 149/88  Pulse 83  Temp(Src) 98.1 F (36.7 C) (Oral)  Resp 15  Ht 5' 7.5" (1.715 m)  Wt 67.9 kg (149 lb 11.1 oz)  BMI 23.09 kg/m2  SpO2 99%  Physical Exam: General: No acute respiratory distress - alert and conversant  Lungs: Clear to auscultation bilaterally without wheezes or crackles Cardiovascular: Regular rate and rhythm  without murmur gallop or rub normal S1 and S2 Abdomen: Nontender, nondistended, soft, bowel sounds positive, no rebound, no ascites, no appreciable mass Extremities: No significant cyanosis, clubbing, or edema bilateral lower extremities  Time spent in discharge (includes decision making & examination of pt): >30 minutes  02/05/2014, 5:43 PM   Cherene Altes, MD [facility] Office  [phone] Pager [phone]  On-Call/Text Page:      [email or web address]       password [facility]

## 2014-02-05 NOTE — Progress Notes (Signed)
Pt transferred to Rehab from [facility] @1830 . Report received from Lillia Corporal, RN. Briefly spoke to family regarding bowel program. Report passed on to oncoming nurse regarding Lantus administration @[facility] .

## 2014-02-05 NOTE — Progress Notes (Signed)
Pt transferred to [facility] at 18:00. Receiving RN notified that Lantus was held this AM and that pt will require his revised dose of daily Lantus tonight (Per Diabetic Consult).

## 2014-02-05 NOTE — Interval H&P Note (Signed)
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  The patient's history has been reviewed, patient examined, and there is no change in status.  Patient continues to be appropriate for intensive inpatient rehabilitation.  I have reviewed the patient's chart and labs.  Questions were answered to the patient's satisfaction.  Meredith Staggers 02/05/2014, 10:15 PM

## 2014-02-05 NOTE — Progress Notes (Signed)
Physical Therapy Treatment Patient Details Name: Jason [name] MRN: [id number] DOB: 08/26/1986 Today's Date: 02/05/2014    History of Present Illness Pt adm 01/29/14 wtih weakness x 4 extremeties. 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 Sepsis, DM, Bil LE Cellulitis, and Bil LE wounds from burning feet with slippers.      PT Comments    Pt very motivated to get OOB and reports he sat in wheelchair almost 4 hours yesterday. He reports pressure relief was done, however he could not recall how often it should be done (educated again every 30 minutes). Mother present and asking very appropriate questions and was able to use tilt-in-space wheelchair to provide pressure relief for [name]. Due to her own medical issues, she does wonder if she could perform this repeatedly every 30 minutes. RN aware and also educated on how to perform.  Follow Up Recommendations  CIR;Supervision/Assistance - 24 hour     Equipment Recommendations   (TBA)    Recommendations for Other Services       Precautions / Restrictions Precautions Precautions: Fall Precaution Comments: high risk for skin breakdown Required Braces or Orthoses: Other Brace/Splint Other Brace/Splint: B prevalon boots    Mobility  Bed Mobility Overal bed mobility: Needs Assistance;+2 for physical assistance Bed Mobility: Rolling Rolling: Total assist;+2 for physical assistance         General bed mobility comments: rolling Rt and Lt with cues to use head/neck flexion and rotation with reaching UEs across to assist. Pt able to rotate his head, however reports unable to flex head/chin to chest due to pain between his shoulders  Transfers Overall transfer level: Needs assistance Equipment used: None (maximove lift)             General transfer comment: maximove bed to tilt-n-space wheelchair  Ambulation/Gait                 Soil scientist Details (indicate cue type and reason): Music therapist, Therapist, sports and mother educated on releasing lateral trunk supports prior to lifting pt out of chair to bed, need for pressure relief every 30 minutes for at least 1 minute, and how to use tilt-in-space wheelchair to perform pressure relief. Mom able to raise and lower chair for this purpose. Educated that if pt does not go to [employer] today, nursing can lift him into wheelchair each day.  Modified Rankin (Stroke Patients Only)       Balance                                    Cognition Arousal/Alertness: Awake/alert Behavior During Therapy: Flat affect Overall Cognitive Status: Within Functional Limits for tasks assessed                      Exercises Other Exercises Other Exercises: Attempted passive neck flexion both with pt supine and once up in chair with pt resisting movement due to shoulder/scapular pain. Educated pt and family to continue to work on neck ROM (rotation, lateral flexion, and flexion) as these are all important movements for him to use with bed mobility and sitting balance.     General Comments General comments (skin integrity, edema, etc.): Pt eager to get OOB and into w/c. Pt up to tilt-n-space wheelchair with Ulice Dash 2 gel cushion via lift as pt  has shown no return of strength in LEs.      Pertinent Vitals/Pain SaO2 99-100% on 6L [place] O2, however slowly decr to 83% when O2 tubing too short during transfer and had to be on RA for ~4 minutes. Isabel O2 resumed and pt returned to >90% in 1-2 minutes.    Home Living                      Prior Function            PT Goals (current goals can now be found in the care plan section) Acute Rehab PT Goals Time For Goal Achievement: 02/15/14 Progress towards PT goals: Progressing toward goals    Frequency  Min 4X/week    PT Plan Current plan remains appropriate    Co-evaluation             End of Session Equipment Utilized During  Treatment: Oxygen Activity Tolerance: Patient tolerated treatment well Patient left: in chair;with call bell/phone within reach;with nursing/sitter in room;with family/visitor present (wheelchair; soft touch call bell)     Time: 8768-1157 PT Time Calculation (min): 39 min  Charges:  $Therapeutic Activity: 38-52 mins                    G CodesJeanie Cooks Rashema Seawright Feb 12, 2014, 12:22 PM Pager [phone]

## 2014-02-05 NOTE — Progress Notes (Signed)
Physical Medicine and Rehabilitation Consult  Reason for Consult: Transverse myelitis  Referring Physician: Dr. Thereasa Solo.  HPI: Jason [name] is a 28 y.o. male with h/o DM type 1, diabetic neuropathy with sensory deficits, as well as BLE diabetic ulcers x 4 month, h/o left elbow MRSA boil as well as gastroparesis who was admitted on 01/29/14 with lethargy, BLE weakness and inability to ambulate. He was found to have bilateral LE cellulitis, possible PNA, Acute on chronic renal injury, leucocytosis with WBC 32k as well as hyperglycemia. He was started on IV antibiotics as well as insulin drip. 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  and MRI cervical spine with hyperintense intramedullary cystic lesion extending from the C4 through T1-2 level question syrinx v/s transverse myelitis. Patient has worsening of MS with progressive BUE weakness on 01/20/14 due to SIRS/sepsis and treatment broadened with addition of fluconazole and acyclovir per ID input. LP with elevated glucose-132 and protein-111 and cultures negative so far. He was started on IV solumedrol with recommendation for repeat MRI cervical and thoracic spine pending.  Antibiotics de-escalated and Dr. Tommy Medal recommends 30 day course of doxycyline for chronic non-healing ulcers BLE with persistently elevated ESR as well as ABI at some course during his stay. WOC consulted for input on BLE ulcers and feels likely vasculitic in nature--santyl added for chemical debridement. 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 Therapy ongoing and CIR recommended by rehab team.  Patient is complaining of tail bone pain. He's been up in a recliner with legs elevated for several minutes, transferred with 2 people but did not use a lift  He has not been able to extend his  fingers 4 number of years but only in the last 2 days has C. loss additional hand function. Patient has noted some numbness in the hands prior to admission but mainly he has had foot numbness  Review of Systems  Constitutional: Positive for fever.  HENT: Negative for congestion.  Respiratory: Negative.  Cardiovascular: Negative.  Gastrointestinal: Negative.  Genitourinary:  Foley  Musculoskeletal: Positive for back pain and neck pain.  Neurological: Positive for tingling, sensory change and focal weakness.  Psychiatric/Behavioral: The patient is nervous/anxious.   Past Medical History   Diagnosis  Date   .  Diabetes mellitus    .  GERD (gastroesophageal reflux disease)    .  Asthma    .  Hx MRSA infection      on face   .  Gastroparesis    .  Diabetic neuropathy    .  Seizures     Past Surgical History   Procedure  Laterality  Date   .  Tonsillectomy      Family History   Problem  Relation  Age of Onset   .  Diabetes  Father    .  Hypertension  Father    .  Asthma       fhx   .  Hypertension       fhx   .  Stroke       fhx   .  Heart disease  Mother     Social History: Lives with girlfriend. reports that he has been smoking Cigars. He has never used smokeless tobacco. He reports that he drinks alcohol. He reports that he does not use illicit  drugs.  Allergies   Allergen  Reactions   .  Cefuroxime Axetil  Anaphylaxis   .  Penicillins  Anaphylaxis     ?can take amoxicillin?   [name] [Benzonatate]  Anaphylaxis   .  Shellfish Allergy  Itching    Medications Prior to Admission   Medication  Sig  Dispense  Refill   .  dexlansoprazole (DEXILANT) 60 MG capsule  Take 1 capsule (60 mg total) by mouth daily.  30 capsule  3   .  doxycycline (VIBRA-TABS) 100 MG tablet  Take 1 tablet (100 mg total) by mouth every 12 (twelve) hours.  10 tablet  0   .  insulin aspart (NOVOLOG) 100 UNIT/ML injection  Inject 1-17 Units into the skin 4 (four) times daily. Per sliding scale  10  mL  11   .  insulin glargine (LANTUS) 100 UNIT/ML injection  Inject 0.2 mLs (20 Units total) into the skin daily.  10 mL  11   .  metoCLOPramide (REGLAN) 10 MG/10ML SOLN  Take 10 mg by mouth 3 (three) times daily as needed for nausea or vomiting.     Marland Kitchen  morphine (AVINZA) 60 MG 24 hr capsule  Take 60 mg by mouth daily.     Earney Navy Bicarbonate (ZEGERID OTC) 20-1100 MG CAPS  Take 1 capsule by mouth at bedtime as needed (for heartburn when out of Dexilant).     .  ondansetron (ZOFRAN) 8 MG tablet  Take 1 tablet (8 mg total) by mouth every 12 (twelve) hours as needed for nausea.  20 tablet  0   .  OxyCODONE (OXYCONTIN) 15 mg T12A 12 hr tablet  Take 1 tablet (15 mg total) by mouth every 12 (twelve) hours.  60 tablet  0   .  oxyCODONE-acetaminophen (PERCOCET) 10-325 MG per tablet  Take 1 tablet by mouth every 6 (six) hours as needed for pain.  120 tablet  0   .  pregabalin (LYRICA) 100 MG capsule  Take 100 mg by mouth 3 (three) times daily.     .  silver sulfADIAZINE (SILVADENE) 1 % cream  Apply topically daily.  50 g  0    Home:  Home Living  [name]  Living Arrangements: Spouse/significant other;Children  Additional Comments: Able to provide 24/7 S after D/C. 1 level home. level entry. bathroom not accessible  Functional History:  Prior Function  Level of Independence: Independent  Comments: on disability  Functional Status:  Mobility:  Bed Mobility  Overal bed mobility: Needs Assistance  Bed Mobility: Supine to Sit  Sidelying to sit: Total assist  Supine to sit: Total assist;+2 for physical assistance;HOB elevated  Sit to supine: Total assist;+2 for physical assistance  General bed mobility comments: Assist for movement of LE's to EOB, as well as hand-over-hand assist to reach for bed rails. Able to lightly grasp the bed rails however unable to grip or use bed rail for support to pull himself up on.  Transfers  Overall  transfer level: Needs assistance  Equipment used: 2 person hand held assist  Transfers: Sit to/from [employer]  Sit to Stand: Total assist;+2 physical assistance  Squat pivot transfers: Total assist;+2 physical assistance  General transfer comment: Blocking of bilateral knees as well as bed pad use to sling hips while transferring bed to chair.    ADL:  ADL  Overall ADL's : Needs assistance/impaired  Eating/Feeding: Maximal assistance;Bed level;With adaptive utensils  Eating/Feeding Details (indicate cue type and reason): pt using Lt hand to self feed grits with built up red handled spoon  Grooming: Total assistance;Sitting  Grooming Details (indicate cue type and reason): wash face  Upper Body Bathing: Total assistance;Bed level  Upper Body Bathing Details (indicate cue type and reason): pt able to AROM shoulder flexion for underarm bathing and deordorant  Lower Body Bathing: Total assistance;Bed level  Upper Body Dressing : Total assistance  Functional mobility during ADLs: (did not get pt OOB today. Will need lift at this time)  General ADL Comments: Pt supine on arrival crying out into hallway due to buttock pain. OT arriving and immediately log rolling patient onto left side. Pt reports immediate relief. Pt could benefit from air mattress. Pt is high risk for skin break down. pt noted to have wounds on bil anterior aspect of shin and bil big toes. Pt log rolled Rt and left for peri care and change linens. Pt progressed to EOB for static sitting. pt reports pain and returned to supine with pillow under left hip. Pt reports much improved positioning. pt with HOB elevated for self feeding. Mother present and discussing mothers personal disabilities and inability to (A) patient at this level.  Cognition:  Cognition  Overall Cognitive Status: Impaired/Different from baseline  Orientation Level: Oriented X4  Cognition  Arousal/Alertness: Awake/alert  Behavior During  Therapy: Flat affect  Overall Cognitive Status: Impaired/Different from baseline  Area of Impairment: Memory  Memory: Decreased short-term memory  General Comments: mother correcting patient and reporting to therapist the events pt states took place have not. ( pt reports taking bath this AM)  Blood pressure 118/79, pulse 90, temperature 98.2 F (36.8 C), temperature source Oral, resp. rate 8, height 5' 7.5" (1.715 m), weight 67.9 kg (149 lb 11.1 oz), SpO2 95.00%.  Physical Exam  Nursing note and vitals reviewed.  Constitutional: He is oriented to person, place, and time. He appears well-developed and well-nourished.  HENT:  Head: Normocephalic and atraumatic.  Eyes: Conjunctivae and EOM are normal. Pupils are equal, round, and reactive to light.  Cardiovascular: Normal rate and regular rhythm.  Respiratory: Effort normal and breath sounds normal. No respiratory distress.  GI: Soft. Bowel sounds are normal. He exhibits no distension. There is no tenderness.  Neurological: He is alert and oriented to person, place, and time. He displays atrophy. A sensory deficit is present. He exhibits abnormal muscle tone.  Absent to light touch both lower extremities does have sensation to light touch in the upper extremities  Motor strength 5 bilateral deltoid bicep 3 bilateral tricep 3 minus wrist extension, trace finger flexion 0 hand intrinsics 0 strength in both lower extremities Tone is reduced in both upper and lower extremities  Skin: Skin is dry.  Psychiatric:  Patient focused on tail bone pain but does cooperate with examination   Results for orders placed during the hospital encounter of 01/29/14 (from the past 24 hour(s))   GLUCOSE, CAPILLARY Status: Abnormal    Collection Time    02/02/14 12:57 PM   Result  Value  Ref Range    Glucose-Capillary  189 (*)  70 - 99 mg/dL    Comment 1  Notify RN     Comment 2  Documented in Chart    GLUCOSE, CAPILLARY Status: Abnormal    Collection Time     02/02/14 5:12 PM   Result  Value  Ref Range    Glucose-Capillary  224 (*)  70 - 99 mg/dL  VANCOMYCIN, TROUGH Status: Abnormal    Collection Time    02/02/14 6:15 PM   Result  Value  Ref Range    Vancomycin Tr  25.3 (*)  10.0 - 20.0 ug/mL   GLUCOSE, CAPILLARY Status: Abnormal    Collection Time    02/02/14 9:04 PM   Result  Value  Ref Range    Glucose-Capillary  257 (*)  70 - 99 mg/dL   GLUCOSE, CAPILLARY Status: Abnormal    Collection Time    02/02/14 11:58 PM   Result  Value  Ref Range    Glucose-Capillary  310 (*)  70 - 99 mg/dL   GLUCOSE, CAPILLARY Status: Abnormal    Collection Time    02/03/14 4:30 AM   Result  Value  Ref Range    Glucose-Capillary  247 (*)  70 - 99 mg/dL    Comment 1  Notify RN    GLUCOSE, CAPILLARY Status: Abnormal    Collection Time    02/03/14 8:00 AM   Result  Value  Ref Range    Glucose-Capillary  160 (*)  70 - 99 mg/dL    No results found.  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 Does the need for close, 24 hr/day medical supervision in concert with the patient's rehab needs make it unreasonable for this patient to be served in a less intensive setting? Potentially 2. Co-Morbidities requiring supervision/potential complications: Type 1 diabetes uncontrolled with neuropathy, sepsis, Chiari malformation with neck pain 3. Due to bladder management, bowel management, safety, skin/wound care, disease management, medication administration, pain management and patient education, does the patient require 24 hr/day rehab nursing? Potentially 4. Does the patient require coordinated care of a physician, rehab nurse, PT (1-2 hrs/day, 5 days/week) and OT (1-2 hrs/day, 5 days/week) to address physical and functional deficits in the context of the above medical diagnosis(es)? Potentially Addressing deficits in the following areas: balance, endurance, locomotion, strength, transferring, bowel/bladder  control, bathing, dressing, feeding, grooming and toileting 5. Can the patient actively participate in an intensive therapy program of at least 3 hrs of therapy per day at least 5 days per week? Potentially 6. The potential for patient to make measurable gains while on inpatient rehab is fair 7. Anticipated functional outcomes upon discharge from inpatient rehab are mod assist with PT, mod assist with OT, n/a with SLP. 8. Estimated rehab length of stay to reach the above functional goals is: 4wk 9. Does the patient have adequate social supports to accommodate these discharge functional goals? Potentially 10. Anticipated D/C setting: Home vs. SNF depending on functional outcomes and family/caregiver abilities 11. Anticipated post D/C treatments: [place] therapy 12. Overall Rehab/Functional Prognosis: fair RECOMMENDATIONS:  This patient's condition is appropriate for continued rehabilitative care in the following setting: CIR once workup is completed and patient able to tolerate therapy  Patient has agreed to participate in recommended program. Yes  Note that insurance prior authorization may be required for reimbursement for recommended care.  Comment: Patient has had severe diabetic neuropathy prior to admission with poor sensation in the lower limbs as well as bilateral hand contractures, per family he had been ambulatory prior to admission and able to use both upper extremities for functional tasks  02/03/2014

## 2014-02-05 NOTE — H&P (Signed)
Physical Medicine and Rehabilitation Admission H&P    Chief Complaint  Patient presents with  . Spinal cord infarct with tetraplegia.     HPI: Jason [name] is a 28 y.o. male with h/o DM type 1, diabetic neuropathy with sensory deficits, as well as BLE diabetic ulcers x 4 month, h/o left elbow MRSA boil as well as gastroparesis who was admitted on 01/29/14 with lethargy, BLE weakness and inability to ambulate. He was found to have bilateral LE cellulitis, possible PNA, Acute on chronic renal injury, leucocytosis with WBC 32k as well as hyperglycemia. He was started on IV antibiotics as well as insulin drip. 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 Patient has worsening of MS with progressive BUE weakness on 01/20/14 due to SIRS/sepsis and treatment broadened with addition of fluconazole and acyclovir per ID input. LP with elevated glucose-132 and protein-111 and cultures negative so far. He was started on IV solumedrol with recommendation for repeat MRI cervical and thoracic spine pending.   Antibiotics de-escalated and Dr. Tommy Medal recommends 30 day course of doxycyline for chronic non-healing ulcers BLE with persistently elevated ESR as well as ABI for evaluation of BLE circulation. WOC consulted for input on BLE ulcers and feels likely vasculitic in nature--santyl added for chemical debridement. 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 Follow up MRI of cervical and thoracic spine done yesterday revealing progressive abnormal spina cord signal now extending throughout upper and mid thoracic cord as far as T8 with mild patchy enhancement about C6 cord level  question spinal cord infarct v/s transverse myelitis. Neurology feels that patient with symptoms c/w spinal cord infarct and recommends steroid taper and no other treatment indicated at this time. ABI BLE done yesterday revealing decrease flow anterior tibialis.  Therapy ongoing and CIR recommended by rehab team.   Review of Systems  HENT: Negative for hearing loss.   Eyes: Positive for blurred vision.  Respiratory: Positive for cough (weaker today) and shortness of breath. Negative for wheezing.   Cardiovascular: Negative for chest pain and palpitations.  Gastrointestinal: Positive for constipation. Negative for heartburn, nausea, vomiting and abdominal pain.  Musculoskeletal: Positive for back pain and myalgias.  Skin:       Chronic ulcers bilateral great toes and bilateral shins. Large pressure/wound right elbow since Dec.   Neurological: Positive for sensory change and focal weakness. Negative for headaches.  Psychiatric/Behavioral: The patient is nervous/anxious.     Past Medical History  Diagnosis Date  . Diabetes mellitus   . GERD (gastroesophageal reflux disease)   . Asthma   . Hx MRSA infection     on face  . Gastroparesis   . Diabetic neuropathy   . Seizures    Past Surgical History  Procedure Laterality Date  . Tonsillectomy     Family History  Problem Relation Age of Onset  . Diabetes Father   . Hypertension Father   . Asthma      fhx  . Hypertension      fhx  . Stroke      fhx  . Heart disease Mother     Social History: Lives with girlfriend (and two young children). reports that he has been  smoking Cigars. He has never used smokeless tobacco. He reports that he drinks alcohol. He reports that he does not use illicit drugs.    Allergies  Allergen Reactions  . Cefuroxime Axetil Anaphylaxis  . Penicillins Anaphylaxis    ?can take amoxicillin?  . Tessalon [Benzonatate] Anaphylaxis  . Shellfish Allergy Itching    Medications Prior to Admission    Medication Sig Dispense Refill  . dexlansoprazole (DEXILANT) 60 MG capsule Take 1 capsule (60 mg total) by mouth daily.  30 capsule  3  . doxycycline (VIBRA-TABS) 100 MG tablet Take 1 tablet (100 mg total) by mouth every 12 (twelve) hours.  10 tablet  0  . insulin aspart (NOVOLOG) 100 UNIT/ML injection Inject 1-17 Units into the skin 4 (four) times daily. Per sliding scale  10 mL  11  . insulin glargine (LANTUS) 100 UNIT/ML injection Inject 0.2 mLs (20 Units total) into the skin daily.  10 mL  11  . metoCLOPramide (REGLAN) 10 MG/10ML SOLN Take 10 mg by mouth 3 (three) times daily as needed for nausea or vomiting.      . morphine (AVINZA) 60 MG 24 hr capsule Take 60 mg by mouth daily.      . Omeprazole-Sodium Bicarbonate (ZEGERID OTC) 20-1100 MG CAPS Take 1 capsule by mouth at bedtime as needed (for heartburn when out of Dexilant).       . ondansetron (ZOFRAN) 8 MG tablet Take 1 tablet (8 mg total) by mouth every 12 (twelve) hours as needed for nausea.  20 tablet  0  . OxyCODONE (OXYCONTIN) 15 mg T12A 12 hr tablet Take 1 tablet (15 mg total) by mouth every 12 (twelve) hours.  60 tablet  0  . oxyCODONE-acetaminophen (PERCOCET) 10-325 MG per tablet Take 1 tablet by mouth every 6 (six) hours as needed for pain.  120 tablet  0  . pregabalin (LYRICA) 100 MG capsule Take 100 mg by mouth 3 (three) times daily.      . silver sulfADIAZINE (SILVADENE) 1 % cream Apply topically daily.  50 g  0    Home: Home Living [name] Living Arrangements: Spouse/significant other;Children Additional Comments: Able to provide 24/7 S after D/C. 1 level home. level entry. bathroom not accessible   Functional History: Prior Function Level of Independence: Independent Comments: on disability  Functional Status:  Mobility: Bed Mobility Overal bed mobility: Needs Assistance;+2 for physical assistance Bed Mobility: Rolling Rolling: Total assist;+2 for physical  assistance Sidelying to sit: Total assist Supine to sit: Total assist;+2 for physical assistance;HOB elevated Sit to supine: Total assist;+2 for physical assistance General bed mobility comments: rolling Rt and Lt with cues to use head/neck flexion and rotation with reaching UEs across to assist. Pt able to rotate his head, however reports unable to flex head/chin to chest due to pain between his shoulders Transfers Overall transfer level: Needs assistance Equipment used: None (maximove lift) Transfers: Squat Pivot Transfers Sit to Stand: Total assist;+2 physical assistance Stand pivot transfers: Total assist;+2 physical assistance Squat pivot transfers: Total assist;+2 physical assistance General transfer comment: maximove bed to tilt-n-space wheelchair   Wheelchair Mobility Wheelchair Assistance Details (indicate cue type and reason): Bernadette, RN and mother educated on releasing lateral trunk supports prior to lifting pt out of chair to bed, need for pressure relief every 30 minutes for at least 1 minute, and how to use tilt-in-space wheelchair to perform pressure relief. Mom able to raise and lower chair for this purpose. Educated that   if pt does not go to Rehab today, nursing can lift him into wheelchair each day.  ADL: total to max assist    Cognition: Cognition Overall Cognitive Status: Within Functional Limits for tasks assessed Orientation Level: Oriented X4 Cognition Arousal/Alertness: Awake/alert Behavior During Therapy: Flat affect Overall Cognitive Status: Within Functional Limits for tasks assessed Area of Impairment: Memory Memory: Decreased short-term memory General Comments: Pt provided instructions from Rn and 30 seconds later patient asking the same question. Pt with no recall of previous information  Physical Exam: Blood pressure 149/88, pulse 83, temperature 98.2 F (36.8 C), temperature source Oral, resp. rate 15, height 5' 7.5" (1.715 m), weight 67.9 kg (149  lb 11.1 oz), SpO2 99.00%. Physical Exam Nursing note and vitals reviewed.  Constitutional: appears frail, emaciated, ill appearing  HENT: voice weak Head: Normocephalic and atraumatic.  Eyes: Conjunctivae and EOM are normal. Pupils are equal, round, and reactive to light.  Cardiovascular: Normal rate and regular rhythm. No murmurs, rubs, or gallops Respiratory: having difficulty clearing secretions, cannot get mucus out of throat. Weak cough.  GI: Soft. Bowel sounds are normal. He exhibits no distension. There is no tenderness. Musc: pt with diffuse tenderness in both shoulders, abdomen  Neurological: He is alert and oriented to person, place, and time. Cognitively appears fairly intact but limited in participation/attention due to anxiety and pain. He displays atrophy. A sensory deficit is present. He exhibits abnormal muscle tone.  Absent to light touch both lower extremities does have sensation to light touch in the upper extremities  Motor strength 4- bilateral deltoid bicep 3 bilateral tricep 3 minus wrist extension, trace finger flexion 0 hand intrinsics. 0/5 hip flexion, knee ext, ankle strength in both lower extremities Tone is reduced in both upper and lower extremities. Sensation diminished in both arms from at least elbow distally, trunk, and both legs.  Skin: extensive wounds bilateral ankles/feet with eschar and some drainge, right elbow.   Psychiatric:  Patient anxious and in pain. Anxiety fuels pain frequently. distracted  Results for orders placed during the hospital encounter of 01/29/14 (from the past 48 hour(s))  GLUCOSE, CAPILLARY     Status: Abnormal   Collection Time    02/03/14  4:12 PM      Result Value Ref Range   Glucose-Capillary 166 (*) 70 - 99 mg/dL   Comment 1 Documented in Chart    GLUCOSE, CAPILLARY     Status: Abnormal   Collection Time    02/03/14  8:02 PM      Result Value Ref Range   Glucose-Capillary 158 (*) 70 - 99 mg/dL  GLUCOSE, CAPILLARY      Status: Abnormal   Collection Time    02/03/14 11:48 PM      Result Value Ref Range   Glucose-Capillary 164 (*) 70 - 99 mg/dL  GLUCOSE, CAPILLARY     Status: Abnormal   Collection Time    02/04/14  8:50 AM      Result Value Ref Range   Glucose-Capillary 138 (*) 70 - 99 mg/dL  GLUCOSE, CAPILLARY     Status: Abnormal   Collection Time    02/04/14  1:23 PM      Result Value Ref Range   Glucose-Capillary 108 (*) 70 - 99 mg/dL  GLUCOSE, CAPILLARY     Status: Abnormal   Collection Time    02/04/14  5:53 PM      Result Value Ref Range   Glucose-Capillary 54 (*) 70 - 99 mg/dL  Comment 1 Notify RN    GLUCOSE, CAPILLARY     Status: Abnormal   Collection Time    02/04/14  6:18 PM      Result Value Ref Range   Glucose-Capillary 151 (*) 70 - 99 mg/dL  GLUCOSE, CAPILLARY     Status: Abnormal   Collection Time    02/04/14  9:49 PM      Result Value Ref Range   Glucose-Capillary 61 (*) 70 - 99 mg/dL  GLUCOSE, CAPILLARY     Status: Abnormal   Collection Time    02/05/14 12:03 AM      Result Value Ref Range   Glucose-Capillary 160 (*) 70 - 99 mg/dL  COMPREHENSIVE METABOLIC PANEL     Status: Abnormal   Collection Time    02/05/14  4:04 AM      Result Value Ref Range   Sodium 137  137 - 147 mEq/L   Potassium 4.0  3.7 - 5.3 mEq/L   Chloride 102  96 - 112 mEq/L   CO2 25  19 - 32 mEq/L   Glucose, Bld 52 (*) 70 - 99 mg/dL   BUN 17  6 - 23 mg/dL   Creatinine, Ser 0.75  0.50 - 1.35 mg/dL   Calcium 8.2 (*) 8.4 - 10.5 mg/dL   Total Protein 6.3  6.0 - 8.3 g/dL   Albumin 1.5 (*) 3.5 - 5.2 g/dL   AST 15  0 - 37 U/L   Comment: HEMOLYSIS AT THIS LEVEL MAY AFFECT RESULT   ALT 5  0 - 53 U/L   Alkaline Phosphatase 115  39 - 117 U/L   Total Bilirubin 0.2 (*) 0.3 - 1.2 mg/dL   GFR calc non Af Amer >90  >90 mL/min   GFR calc Af Amer >90  >90 mL/min   Comment: (NOTE)     The eGFR has been calculated using the CKD EPI equation.     This calculation has not been validated in all clinical  situations.     eGFR's persistently <90 mL/min signify possible Chronic Kidney     Disease.  CBC WITH DIFFERENTIAL     Status: Abnormal   Collection Time    02/05/14  4:04 AM      Result Value Ref Range   WBC 45.5 (*) 4.0 - 10.5 K/uL   RBC 3.60 (*) 4.22 - 5.81 MIL/uL   Hemoglobin 9.2 (*) 13.0 - 17.0 g/dL   HCT 28.3 (*) 39.0 - 52.0 %   MCV 78.6  78.0 - 100.0 fL   MCH 25.6 (*) 26.0 - 34.0 pg   MCHC 32.5  30.0 - 36.0 g/dL   RDW 14.9  11.5 - 15.5 %   Platelets 644 (*) 150 - 400 K/uL   Neutrophils Relative % 80 (*) 43 - 77 %   Lymphocytes Relative 11 (*) 12 - 46 %   Monocytes Relative 9  3 - 12 %   Eosinophils Relative 0  0 - 5 %   Basophils Relative 0  0 - 1 %   Neutro Abs 36.4 (*) 1.7 - 7.7 K/uL   Lymphs Abs 5.0 (*) 0.7 - 4.0 K/uL   Monocytes Absolute 4.1 (*) 0.1 - 1.0 K/uL   Eosinophils Absolute 0.0  0.0 - 0.7 K/uL   Basophils Absolute 0.0  0.0 - 0.1 K/uL   RBC Morphology TARGET CELLS     WBC Morphology MILD LEFT SHIFT (1-5% METAS, OCC MYELO, OCC BANDS)     Comment: TOXIC  GRANULATION  GLUCOSE, CAPILLARY     Status: Abnormal   Collection Time    02/05/14  4:57 AM      Result Value Ref Range   Glucose-Capillary 43 (*) 70 - 99 mg/dL  GLUCOSE, CAPILLARY     Status: None   Collection Time    02/05/14  5:34 AM      Result Value Ref Range   Glucose-Capillary 73  70 - 99 mg/dL  GLUCOSE, CAPILLARY     Status: Abnormal   Collection Time    02/05/14  8:22 AM      Result Value Ref Range   Glucose-Capillary 41 (*) 70 - 99 mg/dL  GLUCOSE, CAPILLARY     Status: Abnormal   Collection Time    02/05/14  9:58 AM      Result Value Ref Range   Glucose-Capillary 32 (*) 70 - 99 mg/dL   Comment 1 Notify RN    GLUCOSE, CAPILLARY     Status: Abnormal   Collection Time    02/05/14 10:01 AM      Result Value Ref Range   Glucose-Capillary 43 (*) 70 - 99 mg/dL  GLUCOSE, CAPILLARY     Status: Abnormal   Collection Time    02/05/14 10:39 AM      Result Value Ref Range   Glucose-Capillary 190  (*) 70 - 99 mg/dL   Mr Cervical Spine W Contrast  02/05/2014   CLINICAL DATA:  27-year-old male with history of diabetes, gastric paresis, diabetic neuropathy. Onset of numbness and weakness in the lower extremities which progressed over less than 24 hr to the flaccid paralysis. Hyperglycemia on presentation. Abnormal cervical spine MRI with C4 to upper thoracic abnormal spinal cord signal. Being empirically treated for transverse myelitis. Initial encounter.  EXAM: MRI CERVICAL SPINE WITH CONTRAST; MRI THORACIC SPINE WITHOUT AND WITH CONTRAST  TECHNIQUE: Multiplanar and multiecho pulse sequences of the cervical spine, to include the craniocervical junction and cervicothoracic junction, were obtained according to standard protocol with intravenous contrast.; Multiplanar and multiecho pulse sequences of the thoracic spine were obtained without and with intravenous contrast.  CONTRAST:  13mL MULTIHANCE GADOBENATE DIMEGLUMINE 529 MG/ML IV SOLN, 14mL MULTIHANCE GADOBENATE DIMEGLUMINE 529 MG/ML IV SOLN  COMPARISON:  Brain and cervical spine MRI without contrast 01/29/2014.  FINDINGS: CERVICAL SPINE FINDINGS:  Axial in sagittal T1 weighted imaging of the cervical spine provided today. No cervical or cervicomedullary dural thickening or enhancement identified.  There is prominence of paravertebral venous plexus suspected about the cervical spinal cord. This is superimposed on increased dorsal epidural soft tissue which begins at C7. This dorsal signal seems most likely to be related to epidural fat, but there is T1 heterogeneity of the soft tissue at the cervicothoracic junction.  There is patchy/petechial abnormal hyper enhancement of the lower cervical spinal cord at the C6 level (series 16, image 5) extending up to the inferior C5 and lower C7 level. No other abnormal intra-axial enhancement.  THORACIC SPINE FINDINGS:  Dorsal epidural lipomatosis, most pronounced at the T4-T5 level. As noted above, a at the  cervicothoracic junction this soft tissue which otherwise appears to be dorsal epidural fat has low T1 signal heterogeneity in it. There also seems to be some enhancement (series 12, image 7).  The abnormal cervical spinal cord expansion and T2 hyperintensity which previously extended only to the T1-T2 disc space now tracks caudally as far as T8, with intense increased T2 signal within the cord to the T5-T6 level. In these newly   affected thoracic cord regions, the central gray matter is disproportionately affected (series 9, image 11, image 15). There is little CSF within the thecal sac at these levels probably due to combined cord expansion and epidural lipomatosis. No abnormal thoracic spinal cord enhancement identified.  No dural thickening or enhancement.  The lower thoracic cord has a more normal appearance from T9 to L1. The conus is incompletely visualized.  Normal thoracic vertebral height and alignment. No marrow edema or evidence of acute osseous abnormality.  Large layering bilateral pleural effusions with confluent increased post-contrast signal in the lungs. Visualized upper abdominal viscera are grossly negative.  IMPRESSION: 1. Progressive abnormal spinal cord signal, previously involving the lower cervical cord, but now extending throughout the upper and mid thoracic cord as far as T8. In the more recently affected thoracic levels, the central gray matter is disproportionately affected. This raises the possibility of spinal cord infarct. The main differential remains inflammatory myelitis. 2. There is mild patchy enhancement about the C6 cord level, which can be seen with both subacute infarct and transverse myelitis. 3. 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 This dorsal epidural fat is mildly heterogeneous at the cervicothoracic junction, felt related to mild edema within the fat. 4. No dura thickening or enhancement. No findings suggestive of pyogenic spinal infection. Study  discussed by telephone with Dr. Charles Stewart at 0755 hr On 02/05/2014 .   Electronically Signed   By: Lee  Hall M.D.   On: 02/05/2014 08:13      Medical Problem List and Plan: 1. 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  DVT Prophylaxis/Anticoagulation: Pharmaceutical: Lovenox 3. Chronic Pain Management:  Continue MS contin 30 mg bid.  Air bed for back and buttock pain as well as pressure relief measures.  4. Mood:  Patient with poor insight and awareness of deficits. He is highly anxious.  Will monitor as activity increases. LCSW to follow for evaluation and support. Treat anxiety as needed. 5. Neuropsych: This patient is capable of making decisions on his own behalf. 6. Spinal cord infarct: Initiate steroid taper and monitor for BS changes as dose decreases.--can taper steroids quickly 7. DM type 2 with gastroparesis: Brittle diabetic per mother--variable intake due to GI symptoms/gastroparesis with subsequent  wide fluctuations. Resume lantus insulin-10 units/HS with four units tid ac for meal coverage. Monitor and adjust as steroid tapers off.  8. ChronicBLE wounds with chronically elevated ESR: continue doxycline for 30 days (started 02/02/14). Follow up with ID after discharge. Continue local wound care. 9.  Constipation/diabetic gastroparesis: Will resume reglan to help with constipation as well as GI symptoms . SSE today. Will check baseline KUB as no BM for a week. Will initiate bowel program.  10. Severe diabetic peripheral neuropathy: Continue neurontin 300 mg at bedtime. Will likely need titration upward for pain control especially given his chronic pain syndrome and use of narcs. 11. Pulmonary: poor secretion management, weak cough  -respiratory therapist and mother working on clearance of secretions and coughing techniques  -keep hob at 30 degrees  -provide suction at bedside which may relieve some of anxiety  and help him orally to clear secretions.  -check CXR to rule out aspiration as he's high risk, has low grade temp      Post Admission Physician Evaluation: 1. 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 2. Patient is admitted to receive collaborative, interdisciplinary care between the physiatrist, rehab   nursing staff, and therapy team. 3. Patient's level of medical complexity and substantial therapy needs in context of that medical necessity cannot be provided at a lesser intensity of care such as a SNF. 4. Patient has experienced substantial functional loss from his/her baseline which was documented above under the "Functional History" and "Functional Status" headings.  Judging by the patient's diagnosis, physical exam, and functional history, the patient has potential for functional progress which will result in measurable gains while on inpatient rehab.  These gains will be of substantial and practical use upon discharge  in facilitating mobility and self-care at the household level. 5. Physiatrist will provide 24 hour management of medical needs as well as oversight of the therapy plan/treatment and provide guidance as appropriate regarding the interaction of the two. 6. 24 hour rehab nursing will assist with bladder management, bowel management, safety, skin/wound care, disease management, medication administration, pain management and patient education  and help integrate therapy concepts, techniques,education, etc. 7. PT will assess and treat for/with: Lower extremity strength, range of motion, stamina, balance, functional mobility, safety, adaptive techniques and equipment, pain mgt, NMR, realistic goal setting and SCI education, egosupport, monitoring of wounds with activity.   Goals are: mod to max assist. 8. OT will assess and treat for/with: ADL's, functional mobility, safety, upper extremity strength, adaptive techniques and  equipment, NMR, pain mgt, SCI education, family ed, ego-support.   Goals are: mod to max assist. 9. SLP will assess and treat for/with: n/a.  Goals are: n/a. 10. Case Management and Social Worker will assess and treat for psychological issues and discharge planning. 11. Team conference will be held weekly to assess progress toward goals and to determine barriers to discharge. 12. Patient will receive at least 3 hours of therapy per day at least 5 days per week. 13. ELOS: 23-30 days       14. Prognosis:  good and fair. Pt and family will need extensive education about his injury and appropriate goals given his clinical picture.     Meredith Staggers, MD, [facility]   02/05/2014

## 2014-02-05 NOTE — H&P (View-Only) (Signed)
Physical Medicine and Rehabilitation Admission H&P    Chief Complaint  Patient presents with  . Spinal cord infarct with tetraplegia.     HPI: Jason [name] is a 28 y.o. male with h/o DM type 1, diabetic neuropathy with sensory deficits, as well as BLE diabetic ulcers x 4 month, h/o left elbow MRSA boil as well as gastroparesis who was admitted on 01/29/14 with lethargy, BLE weakness and inability to ambulate. He was found to have bilateral LE cellulitis, possible PNA, Acute on chronic renal injury, leucocytosis with WBC 32k as well as hyperglycemia. He was started on IV antibiotics as well as insulin drip. 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 Patient has worsening of MS with progressive BUE weakness on 01/20/14 due to SIRS/sepsis and treatment broadened with addition of fluconazole and acyclovir per ID input. LP with elevated glucose-132 and protein-111 and cultures negative so far. He was started on IV solumedrol with recommendation for repeat MRI cervical and thoracic spine pending.   Antibiotics de-escalated and Dr. Tommy Medal recommends 30 day course of doxycyline for chronic non-healing ulcers BLE with persistently elevated ESR as well as ABI for evaluation of BLE circulation. WOC consulted for input on BLE ulcers and feels likely vasculitic in nature--santyl added for chemical debridement. 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 Follow up MRI of cervical and thoracic spine done yesterday revealing progressive abnormal spina cord signal now extending throughout upper and mid thoracic cord as far as T8 with mild patchy enhancement about C6 cord level  question spinal cord infarct v/s transverse myelitis. Neurology feels that patient with symptoms c/w spinal cord infarct and recommends steroid taper and no other treatment indicated at this time. ABI BLE done yesterday revealing decrease flow anterior tibialis.  Therapy ongoing and CIR recommended by rehab team.   Review of Systems  HENT: Negative for hearing loss.   Eyes: Positive for blurred vision.  Respiratory: Positive for cough (weaker today) and shortness of breath. Negative for wheezing.   Cardiovascular: Negative for chest pain and palpitations.  Gastrointestinal: Positive for constipation. Negative for heartburn, nausea, vomiting and abdominal pain.  Musculoskeletal: Positive for back pain and myalgias.  Skin:       Chronic ulcers bilateral great toes and bilateral shins. Large pressure/wound right elbow since Dec.   Neurological: Positive for sensory change and focal weakness. Negative for headaches.  Psychiatric/Behavioral: The patient is nervous/anxious.     Past Medical History  Diagnosis Date  . Diabetes mellitus   . GERD (gastroesophageal reflux disease)   . Asthma   . Hx MRSA infection     on face  . Gastroparesis   . Diabetic neuropathy   . Seizures    Past Surgical History  Procedure Laterality Date  . Tonsillectomy     Family History  Problem Relation Age of Onset  . Diabetes Father   . Hypertension Father   . Asthma      fhx  . Hypertension      fhx  . Stroke      fhx  . Heart disease Mother     Social History: Lives with girlfriend (and two young children). reports that he has been  smoking Cigars. He has never used smokeless tobacco. He reports that he drinks alcohol. He reports that he does not use illicit drugs.    Allergies  Allergen Reactions  . Cefuroxime Axetil Anaphylaxis  . Penicillins Anaphylaxis    ?can take amoxicillin?  . Tessalon [Benzonatate] Anaphylaxis  . Shellfish Allergy Itching    Medications Prior to Admission    Medication Sig Dispense Refill  . dexlansoprazole (DEXILANT) 60 MG capsule Take 1 capsule (60 mg total) by mouth daily.  30 capsule  3  . doxycycline (VIBRA-TABS) 100 MG tablet Take 1 tablet (100 mg total) by mouth every 12 (twelve) hours.  10 tablet  0  . insulin aspart (NOVOLOG) 100 UNIT/ML injection Inject 1-17 Units into the skin 4 (four) times daily. Per sliding scale  10 mL  11  . insulin glargine (LANTUS) 100 UNIT/ML injection Inject 0.2 mLs (20 Units total) into the skin daily.  10 mL  11  . metoCLOPramide (REGLAN) 10 MG/10ML SOLN Take 10 mg by mouth 3 (three) times daily as needed for nausea or vomiting.      . morphine (AVINZA) 60 MG 24 hr capsule Take 60 mg by mouth daily.      . Omeprazole-Sodium Bicarbonate (ZEGERID OTC) 20-1100 MG CAPS Take 1 capsule by mouth at bedtime as needed (for heartburn when out of Dexilant).       . ondansetron (ZOFRAN) 8 MG tablet Take 1 tablet (8 mg total) by mouth every 12 (twelve) hours as needed for nausea.  20 tablet  0  . OxyCODONE (OXYCONTIN) 15 mg T12A 12 hr tablet Take 1 tablet (15 mg total) by mouth every 12 (twelve) hours.  60 tablet  0  . oxyCODONE-acetaminophen (PERCOCET) 10-325 MG per tablet Take 1 tablet by mouth every 6 (six) hours as needed for pain.  120 tablet  0  . pregabalin (LYRICA) 100 MG capsule Take 100 mg by mouth 3 (three) times daily.      . silver sulfADIAZINE (SILVADENE) 1 % cream Apply topically daily.  50 g  0    Home: Home Living [name] Living Arrangements: Spouse/significant other;Children Additional Comments: Able to provide 24/7 S after D/C. 1 level home. level entry. bathroom not accessible   Functional History: Prior Function Level of Independence: Independent Comments: on disability  Functional Status:  Mobility: Bed Mobility Overal bed mobility: Needs Assistance;+2 for physical assistance Bed Mobility: Rolling Rolling: Total assist;+2 for physical  assistance Sidelying to sit: Total assist Supine to sit: Total assist;+2 for physical assistance;HOB elevated Sit to supine: Total assist;+2 for physical assistance General bed mobility comments: rolling Rt and Lt with cues to use head/neck flexion and rotation with reaching UEs across to assist. Pt able to rotate his head, however reports unable to flex head/chin to chest due to pain between his shoulders Transfers Overall transfer level: Needs assistance Equipment used: None (maximove lift) Transfers: Squat Pivot Transfers Sit to Stand: Total assist;+2 physical assistance Stand pivot transfers: Total assist;+2 physical assistance Squat pivot transfers: Total assist;+2 physical assistance General transfer comment: maximove bed to tilt-n-space wheelchair   Wheelchair Mobility Wheelchair Assistance Details (indicate cue type and reason): Bernadette, RN and mother educated on releasing lateral trunk supports prior to lifting pt out of chair to bed, need for pressure relief every 30 minutes for at least 1 minute, and how to use tilt-in-space wheelchair to perform pressure relief. Mom able to raise and lower chair for this purpose. Educated that   if pt does not go to Rehab today, nursing can lift him into wheelchair each day.  ADL: total to max assist    Cognition: Cognition Overall Cognitive Status: Within Functional Limits for tasks assessed Orientation Level: Oriented X4 Cognition Arousal/Alertness: Awake/alert Behavior During Therapy: Flat affect Overall Cognitive Status: Within Functional Limits for tasks assessed Area of Impairment: Memory Memory: Decreased short-term memory General Comments: Pt provided instructions from Rn and 30 seconds later patient asking the same question. Pt with no recall of previous information  Physical Exam: Blood pressure 149/88, pulse 83, temperature 98.2 F (36.8 C), temperature source Oral, resp. rate 15, height 5' 7.5" (1.715 m), weight 67.9 kg (149  lb 11.1 oz), SpO2 99.00%. Physical Exam Nursing note and vitals reviewed.  Constitutional: appears frail, emaciated, ill appearing  HENT: voice weak Head: Normocephalic and atraumatic.  Eyes: Conjunctivae and EOM are normal. Pupils are equal, round, and reactive to light.  Cardiovascular: Normal rate and regular rhythm. No murmurs, rubs, or gallops Respiratory: having difficulty clearing secretions, cannot get mucus out of throat. Weak cough.  GI: Soft. Bowel sounds are normal. He exhibits no distension. There is no tenderness. Musc: pt with diffuse tenderness in both shoulders, abdomen  Neurological: He is alert and oriented to person, place, and time. Cognitively appears fairly intact but limited in participation/attention due to anxiety and pain. He displays atrophy. A sensory deficit is present. He exhibits abnormal muscle tone.  Absent to light touch both lower extremities does have sensation to light touch in the upper extremities  Motor strength 4- bilateral deltoid bicep 3 bilateral tricep 3 minus wrist extension, trace finger flexion 0 hand intrinsics. 0/5 hip flexion, knee ext, ankle strength in both lower extremities Tone is reduced in both upper and lower extremities. Sensation diminished in both arms from at least elbow distally, trunk, and both legs.  Skin: extensive wounds bilateral ankles/feet with eschar and some drainge, right elbow.   Psychiatric:  Patient anxious and in pain. Anxiety fuels pain frequently. distracted  Results for orders placed during the hospital encounter of 01/29/14 (from the past 48 hour(s))  GLUCOSE, CAPILLARY     Status: Abnormal   Collection Time    02/03/14  4:12 PM      Result Value Ref Range   Glucose-Capillary 166 (*) 70 - 99 mg/dL   Comment 1 Documented in Chart    GLUCOSE, CAPILLARY     Status: Abnormal   Collection Time    02/03/14  8:02 PM      Result Value Ref Range   Glucose-Capillary 158 (*) 70 - 99 mg/dL  GLUCOSE, CAPILLARY      Status: Abnormal   Collection Time    02/03/14 11:48 PM      Result Value Ref Range   Glucose-Capillary 164 (*) 70 - 99 mg/dL  GLUCOSE, CAPILLARY     Status: Abnormal   Collection Time    02/04/14  8:50 AM      Result Value Ref Range   Glucose-Capillary 138 (*) 70 - 99 mg/dL  GLUCOSE, CAPILLARY     Status: Abnormal   Collection Time    02/04/14  1:23 PM      Result Value Ref Range   Glucose-Capillary 108 (*) 70 - 99 mg/dL  GLUCOSE, CAPILLARY     Status: Abnormal   Collection Time    02/04/14  5:53 PM      Result Value Ref Range   Glucose-Capillary 54 (*) 70 - 99 mg/dL     Comment 1 Notify RN    GLUCOSE, CAPILLARY     Status: Abnormal   Collection Time    02/04/14  6:18 PM      Result Value Ref Range   Glucose-Capillary 151 (*) 70 - 99 mg/dL  GLUCOSE, CAPILLARY     Status: Abnormal   Collection Time    02/04/14  9:49 PM      Result Value Ref Range   Glucose-Capillary 61 (*) 70 - 99 mg/dL  GLUCOSE, CAPILLARY     Status: Abnormal   Collection Time    02/05/14 12:03 AM      Result Value Ref Range   Glucose-Capillary 160 (*) 70 - 99 mg/dL  COMPREHENSIVE METABOLIC PANEL     Status: Abnormal   Collection Time    02/05/14  4:04 AM      Result Value Ref Range   Sodium 137  137 - 147 mEq/L   Potassium 4.0  3.7 - 5.3 mEq/L   Chloride 102  96 - 112 mEq/L   CO2 25  19 - 32 mEq/L   Glucose, Bld 52 (*) 70 - 99 mg/dL   BUN 17  6 - 23 mg/dL   Creatinine, Ser 0.75  0.50 - 1.35 mg/dL   Calcium 8.2 (*) 8.4 - 10.5 mg/dL   Total Protein 6.3  6.0 - 8.3 g/dL   Albumin 1.5 (*) 3.5 - 5.2 g/dL   AST 15  0 - 37 U/L   Comment: HEMOLYSIS AT THIS LEVEL MAY AFFECT RESULT   ALT 5  0 - 53 U/L   Alkaline Phosphatase 115  39 - 117 U/L   Total Bilirubin 0.2 (*) 0.3 - 1.2 mg/dL   GFR calc non Af Amer >90  >90 mL/min   GFR calc Af Amer >90  >90 mL/min   Comment: (NOTE)     The eGFR has been calculated using the CKD EPI equation.     This calculation has not been validated in all clinical  situations.     eGFR's persistently <90 mL/min signify possible Chronic Kidney     Disease.  CBC WITH DIFFERENTIAL     Status: Abnormal   Collection Time    02/05/14  4:04 AM      Result Value Ref Range   WBC 45.5 (*) 4.0 - 10.5 K/uL   RBC 3.60 (*) 4.22 - 5.81 MIL/uL   Hemoglobin 9.2 (*) 13.0 - 17.0 g/dL   HCT 28.3 (*) 39.0 - 52.0 %   MCV 78.6  78.0 - 100.0 fL   MCH 25.6 (*) 26.0 - 34.0 pg   MCHC 32.5  30.0 - 36.0 g/dL   RDW 14.9  11.5 - 15.5 %   Platelets 644 (*) 150 - 400 K/uL   Neutrophils Relative % 80 (*) 43 - 77 %   Lymphocytes Relative 11 (*) 12 - 46 %   Monocytes Relative 9  3 - 12 %   Eosinophils Relative 0  0 - 5 %   Basophils Relative 0  0 - 1 %   Neutro Abs 36.4 (*) 1.7 - 7.7 K/uL   Lymphs Abs 5.0 (*) 0.7 - 4.0 K/uL   Monocytes Absolute 4.1 (*) 0.1 - 1.0 K/uL   Eosinophils Absolute 0.0  0.0 - 0.7 K/uL   Basophils Absolute 0.0  0.0 - 0.1 K/uL   RBC Morphology TARGET CELLS     WBC Morphology MILD LEFT SHIFT (1-5% METAS, OCC MYELO, OCC BANDS)     Comment: TOXIC   GRANULATION  GLUCOSE, CAPILLARY     Status: Abnormal   Collection Time    02/05/14  4:57 AM      Result Value Ref Range   Glucose-Capillary 43 (*) 70 - 99 mg/dL  GLUCOSE, CAPILLARY     Status: None   Collection Time    02/05/14  5:34 AM      Result Value Ref Range   Glucose-Capillary 73  70 - 99 mg/dL  GLUCOSE, CAPILLARY     Status: Abnormal   Collection Time    02/05/14  8:22 AM      Result Value Ref Range   Glucose-Capillary 41 (*) 70 - 99 mg/dL  GLUCOSE, CAPILLARY     Status: Abnormal   Collection Time    02/05/14  9:58 AM      Result Value Ref Range   Glucose-Capillary 32 (*) 70 - 99 mg/dL   Comment 1 Notify RN    GLUCOSE, CAPILLARY     Status: Abnormal   Collection Time    02/05/14 10:01 AM      Result Value Ref Range   Glucose-Capillary 43 (*) 70 - 99 mg/dL  GLUCOSE, CAPILLARY     Status: Abnormal   Collection Time    02/05/14 10:39 AM      Result Value Ref Range   Glucose-Capillary 190  (*) 70 - 99 mg/dL   Mr Cervical Spine W Contrast  02/05/2014   CLINICAL DATA:  27-year-old male with history of diabetes, gastric paresis, diabetic neuropathy. Onset of numbness and weakness in the lower extremities which progressed over less than 24 hr to the flaccid paralysis. Hyperglycemia on presentation. Abnormal cervical spine MRI with C4 to upper thoracic abnormal spinal cord signal. Being empirically treated for transverse myelitis. Initial encounter.  EXAM: MRI CERVICAL SPINE WITH CONTRAST; MRI THORACIC SPINE WITHOUT AND WITH CONTRAST  TECHNIQUE: Multiplanar and multiecho pulse sequences of the cervical spine, to include the craniocervical junction and cervicothoracic junction, were obtained according to standard protocol with intravenous contrast.; Multiplanar and multiecho pulse sequences of the thoracic spine were obtained without and with intravenous contrast.  CONTRAST:  13mL MULTIHANCE GADOBENATE DIMEGLUMINE 529 MG/ML IV SOLN, 14mL MULTIHANCE GADOBENATE DIMEGLUMINE 529 MG/ML IV SOLN  COMPARISON:  Brain and cervical spine MRI without contrast 01/29/2014.  FINDINGS: CERVICAL SPINE FINDINGS:  Axial in sagittal T1 weighted imaging of the cervical spine provided today. No cervical or cervicomedullary dural thickening or enhancement identified.  There is prominence of paravertebral venous plexus suspected about the cervical spinal cord. This is superimposed on increased dorsal epidural soft tissue which begins at C7. This dorsal signal seems most likely to be related to epidural fat, but there is T1 heterogeneity of the soft tissue at the cervicothoracic junction.  There is patchy/petechial abnormal hyper enhancement of the lower cervical spinal cord at the C6 level (series 16, image 5) extending up to the inferior C5 and lower C7 level. No other abnormal intra-axial enhancement.  THORACIC SPINE FINDINGS:  Dorsal epidural lipomatosis, most pronounced at the T4-T5 level. As noted above, a at the  cervicothoracic junction this soft tissue which otherwise appears to be dorsal epidural fat has low T1 signal heterogeneity in it. There also seems to be some enhancement (series 12, image 7).  The abnormal cervical spinal cord expansion and T2 hyperintensity which previously extended only to the T1-T2 disc space now tracks caudally as far as T8, with intense increased T2 signal within the cord to the T5-T6 level. In these newly   affected thoracic cord regions, the central gray matter is disproportionately affected (series 9, image 11, image 15). There is little CSF within the thecal sac at these levels probably due to combined cord expansion and epidural lipomatosis. No abnormal thoracic spinal cord enhancement identified.  No dural thickening or enhancement.  The lower thoracic cord has a more normal appearance from T9 to L1. The conus is incompletely visualized.  Normal thoracic vertebral height and alignment. No marrow edema or evidence of acute osseous abnormality.  Large layering bilateral pleural effusions with confluent increased post-contrast signal in the lungs. Visualized upper abdominal viscera are grossly negative.  IMPRESSION: 1. Progressive abnormal spinal cord signal, previously involving the lower cervical cord, but now extending throughout the upper and mid thoracic cord as far as T8. In the more recently affected thoracic levels, the central gray matter is disproportionately affected. This raises the possibility of spinal cord infarct. The main differential remains inflammatory myelitis. 2. There is mild patchy enhancement about the C6 cord level, which can be seen with both subacute infarct and transverse myelitis. 3. 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 This dorsal epidural fat is mildly heterogeneous at the cervicothoracic junction, felt related to mild edema within the fat. 4. No dura thickening or enhancement. No findings suggestive of pyogenic spinal infection. Study  discussed by telephone with Dr. Charles Stewart at 0755 hr On 02/05/2014 .   Electronically Signed   By: Lee  Hall M.D.   On: 02/05/2014 08:13      Medical Problem List and Plan: 1. 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  DVT Prophylaxis/Anticoagulation: Pharmaceutical: Lovenox 3. Chronic Pain Management:  Continue MS contin 30 mg bid.  Air bed for back and buttock pain as well as pressure relief measures.  4. Mood:  Patient with poor insight and awareness of deficits. He is highly anxious.  Will monitor as activity increases. LCSW to follow for evaluation and support. Treat anxiety as needed. 5. Neuropsych: This patient is capable of making decisions on his own behalf. 6. Spinal cord infarct: Initiate steroid taper and monitor for BS changes as dose decreases.--can taper steroids quickly 7. DM type 2 with gastroparesis: Brittle diabetic per mother--variable intake due to GI symptoms/gastroparesis with subsequent  wide fluctuations. Resume lantus insulin-10 units/HS with four units tid ac for meal coverage. Monitor and adjust as steroid tapers off.  8. ChronicBLE wounds with chronically elevated ESR: continue doxycline for 30 days (started 02/02/14). Follow up with ID after discharge. Continue local wound care. 9.  Constipation/diabetic gastroparesis: Will resume reglan to help with constipation as well as GI symptoms . SSE today. Will check baseline KUB as no BM for a week. Will initiate bowel program.  10. Severe diabetic peripheral neuropathy: Continue neurontin 300 mg at bedtime. Will likely need titration upward for pain control especially given his chronic pain syndrome and use of narcs. 11. Pulmonary: poor secretion management, weak cough  -respiratory therapist and mother working on clearance of secretions and coughing techniques  -keep hob at 30 degrees  -provide suction at bedside which may relieve some of anxiety  and help him orally to clear secretions.  -check CXR to rule out aspiration as he's high risk, has low grade temp      Post Admission Physician Evaluation: 1. 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 2. Patient is admitted to receive collaborative, interdisciplinary care between the physiatrist, rehab   nursing staff, and therapy team. 3. Patient's level of medical complexity and substantial therapy needs in context of that medical necessity cannot be provided at a lesser intensity of care such as a SNF. 4. Patient has experienced substantial functional loss from his/her baseline which was documented above under the "Functional History" and "Functional Status" headings.  Judging by the patient's diagnosis, physical exam, and functional history, the patient has potential for functional progress which will result in measurable gains while on inpatient rehab.  These gains will be of substantial and practical use upon discharge  in facilitating mobility and self-care at the household level. 5. Physiatrist will provide 24 hour management of medical needs as well as oversight of the therapy plan/treatment and provide guidance as appropriate regarding the interaction of the two. 6. 24 hour rehab nursing will assist with bladder management, bowel management, safety, skin/wound care, disease management, medication administration, pain management and patient education  and help integrate therapy concepts, techniques,education, etc. 7. PT will assess and treat for/with: Lower extremity strength, range of motion, stamina, balance, functional mobility, safety, adaptive techniques and equipment, pain mgt, NMR, realistic goal setting and SCI education, egosupport, monitoring of wounds with activity.   Goals are: mod to max assist. 8. OT will assess and treat for/with: ADL's, functional mobility, safety, upper extremity strength, adaptive techniques and  equipment, NMR, pain mgt, SCI education, family ed, ego-support.   Goals are: mod to max assist. 9. SLP will assess and treat for/with: n/a.  Goals are: n/a. 10. Case Management and Social Worker will assess and treat for psychological issues and discharge planning. 11. Team conference will be held weekly to assess progress toward goals and to determine barriers to discharge. 12. Patient will receive at least 3 hours of therapy per day at least 5 days per week. 13. ELOS: 23-30 days       14. Prognosis:  good and fair. Pt and family will need extensive education about his injury and appropriate goals given his clinical picture.     Meredith Staggers, MD, [facility]   02/05/2014

## 2014-02-06 ENCOUNTER — Inpatient Hospital Stay (HOSPITAL_COMMUNITY): Payer: Medicaid Other | Admitting: Occupational Therapy

## 2014-02-06 ENCOUNTER — Inpatient Hospital Stay (HOSPITAL_COMMUNITY): Payer: Medicaid Other | Admitting: Physical Therapy

## 2014-02-06 DIAGNOSIS — K3184 Gastroparesis: Secondary | ICD-10-CM

## 2014-02-06 DIAGNOSIS — L02419 Cutaneous abscess of limb, unspecified: Secondary | ICD-10-CM

## 2014-02-06 DIAGNOSIS — L03119 Cellulitis of unspecified part of limb: Secondary | ICD-10-CM

## 2014-02-06 DIAGNOSIS — G9519 Other vascular myelopathies: Secondary | ICD-10-CM

## 2014-02-06 DIAGNOSIS — G8250 Quadriplegia, unspecified: Secondary | ICD-10-CM

## 2014-02-06 DIAGNOSIS — E1149 Type 2 diabetes mellitus with other diabetic neurological complication: Secondary | ICD-10-CM

## 2014-02-06 DIAGNOSIS — E291 Testicular hypofunction: Secondary | ICD-10-CM

## 2014-02-06 LAB — GLUCOSE, CAPILLARY
GLUCOSE-CAPILLARY: 98 mg/dL (ref 70–99)
GLUCOSE-CAPILLARY: 213 mg/dL — AB (ref 70–99)
Glucose-Capillary: 115 mg/dL — ABNORMAL HIGH (ref 70–99)
Glucose-Capillary: 104 mg/dL — ABNORMAL HIGH (ref 70–99)
Glucose-Capillary: 110 mg/dL — ABNORMAL HIGH (ref 70–99)
Glucose-Capillary: 192 mg/dL — ABNORMAL HIGH (ref 70–99)
Glucose-Capillary: 266 mg/dL — ABNORMAL HIGH (ref 70–99)

## 2014-02-06 MED ORDER — INSULIN GLARGINE 100 UNIT/ML ~~LOC~~ SOLN
6.0000 [IU] | Freq: Once | SUBCUTANEOUS | Status: AC
  Filled 2014-02-06: qty 0.06

## 2014-02-06 NOTE — Evaluation (Signed)
Physical Therapy Assessment and Plan  Patient Details  Name: Jason [name] MRN: [id number] Date of Birth: [birth year]  PT Diagnosis: Abnormal posture, Pain in Neck and 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Rehab Potential: Good ELOS: 10-14 days   Today's Date: 02/06/2014 Time: 4196-2229 Time Calculation (min): 60 min  Problem List:  Patient Active Problem List   Diagnosis Date Noted  . 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 Diabetic foot ulcer 02/04/2014  . Oral thrush 02/04/2014  . Leg weakness 01/30/2014  . Bilateral lower leg cellulitis 01/30/2014  . Altered mental status 01/30/2014  . 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 HCAP (healthcare-associated pneumonia) 01/29/2014  . DKA (diabetic ketoacidoses) 12/24/2013  . DM type 1 causing leg or foot ulcer 10/04/2013  . Rhinovirus infection 10/03/2013  . Protein-calorie malnutrition, severe 09/24/2013  . Aspiration pneumonia 09/23/2013  . Gastroparesis due to DM 06/02/2013  . Chronic low back pain 04/18/2013  . Painful diabetic neuropathy 03/20/2013  . Traumatic ecchymosis of toe 01/29/2013  . Tobacco abuse 01/29/2013  . Noncompliance with diet and medication regimen 01/29/2013  . VIRAL URI 09/15/2007  . GERD 09/15/2007  . Type 1 diabetes, uncontrolled, with neuropathy 05/29/2007  . ASTHMA 05/29/2007    Past Medical History:  Past Medical History  Diagnosis Date  . Diabetes mellitus   . GERD (gastroesophageal reflux disease)   . Asthma   . Hx MRSA infection     on face  . Gastroparesis   . Diabetic neuropathy   . Seizures    Past Surgical History:  Past Surgical History  Procedure Laterality Date  . Tonsillectomy      Assessment & Plan Clinical Impression: Jason [name] is a 28 y.o. male with h/o DM type 1, diabetic neuropathy with sensory deficits, as well as BLE diabetic ulcers x 4 month, h/o left elbow MRSA boil as well as gastroparesis who was admitted on 01/29/14 with lethargy, BLE weakness and inability to ambulate. He was found  to have bilateral LE cellulitis, possible PNA, Acute on chronic renal injury, leucocytosis with WBC 32k as well as hyperglycemia. He was started on IV antibiotics as well as insulin drip. 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 Patient has worsening of MS with progressive BUE weakness on 01/20/14 due to SIRS/sepsis and treatment broadened with addition of fluconazole and acyclovir per ID input. LP with elevated glucose-132 and protein-111 and cultures negative so far. He was started on IV solumedrol with recommendation for repeat MRI cervical and thoracic spine pending.  Antibiotics de-escalated and Dr. Tommy Medal recommends 30 day course of doxycyline for chronic non-healing ulcers BLE with persistently elevated ESR as well as ABI for evaluation of BLE circulation. WOC consulted for input on BLE ulcers and feels likely vasculitic in nature--santyl added for chemical debridement. 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 Follow up MRI of cervical and thoracic spine done yesterday revealing progressive abnormal spina cord signal now extending throughout upper and mid thoracic cord as far as T8 with mild patchy enhancement about C6 cord level question spinal cord infarct v/s transverse myelitis. Neurology feels that patient with symptoms c/w spinal cord infarct and recommends steroid taper and no other treatment indicated at this time. ABI BLE done yesterday  revealing decrease flow anterior tibialis.  Patient transferred to CIR on 02/05/2014 .   Patient currently requires total with mobility secondary to abnormal tone and B LE's paraplegia and UE's mixed C5,6 paresis.  Prior to  hospitalization, patient was independent  with mobility and lived with Significant other (fiance ([name])) in a House (Rented) home.  Home access is  Level entry.  Patient will benefit from skilled PT intervention to maximize safe functional mobility, minimize fall risk, decrease caregiver burden and Family/Caregiver transfer training for planned discharge home with 24 hour assist.  Anticipate patient will benefit from follow up [facility] at discharge.  PT - End of Session Activity Tolerance: Tolerates 30+ min activity with multiple rests Endurance Deficit: Yes PT Assessment Rehab Potential: Good PT Patient demonstrates impairments in the following area(s): Balance;Endurance;Motor;Pain;Safety;Skin Integrity PT Transfers Functional Problem(s): Bed Mobility;Bed to Chair;Car;Furniture PT Locomotion Functional Problem(s): Wheelchair Mobility PT Plan PT Intensity: Minimum of 1-2 x/day ,45 to 90 minutes PT Frequency: 5 out of 7 days PT Duration Estimated Length of Stay: 10-14 days PT Treatment/Interventions: Balance/vestibular training;DME/adaptive equipment instruction;Functional mobility training;Pain management;Patient/family education;Therapeutic Activities PT Transfers Anticipated Outcome(s): Total assist with family/caregiver support PT Locomotion Anticipated Outcome(s): Total assist W/C with family/caregiver support PT Recommendation Follow Up Recommendations: Home health PT Patient destination: Home Equipment Recommended: 3 in 1 bedside comode;To be determined Equipment Details: To Be Determined: w/c type, and may require mechanical lift with slings  Skilled Therapeutic Intervention   PT Evaluation Precautions/Restrictions Precautions Precautions: Fall Precaution Comments: high risk for skin breakdown Other Brace/Splint: Bilateral Prevalon boots Restrictions Weight Bearing Restrictions: No General Chart Reviewed: Yes Family/Caregiver Present: Yes (Mother ([name]))  Vital  SignsTherapy Vitals Temp: 98.5 F (36.9 C) Temp src: Oral Pulse Rate: 81 Resp: 18 BP: 118/79 mmHg Patient Position, if appropriate: Sitting Oxygen Therapy SpO2: 100 % O2 Device: Nasal cannula O2 Flow Rate (L/min): 4 L/min Pain Pain Assessment Pain Assessment: 0-10 Pain Type: Chronic pain Pain Location: Neck Pain Orientation: Left;Posterior Pain Descriptors / Indicators: Aching;Sharp Pain Onset: On-going Pain Intervention(s): Medication (See eMAR);Repositioned Multiple Pain Sites: Yes ([place]bone: 7/10) Home Living/Prior Functioning Home Living Available Help at Discharge: Family;Available 24 hours/day Type of Home: House (Rented) Home Access: Level entry Home Layout: One level Additional Comments:  (RW, cane)  Lives With: Significant other (fiance ([name])) Prior Function Level of Independence: Independent with basic ADLs;Independent with gait (hopspitalized in Dec 2014 and again in Feb 2015)  Able to Take Stairs?: No Driving: Yes Vocation: Unemployed (was trying to get disability) Vision/Perception  Baseline Cognition A&O x 4 Sensation Sensation Light Touch: Impaired by gross assessment Coordination Gross Motor Movements are Fluid and Coordinated: No Fine Motor Movements are Fluid and Coordinated: No Heel Shin Test: unable to perform due to No motor involvement in LE's Motor  Motor Motor: Tetraplegia  Mobility Bed Mobility Bed Mobility: Rolling Right;Rolling Left;Supine to Sit;Sit to Supine;Scooting to [facility] Rolling Right: 1: +1 Total assist;With rail (Patient able to place L UE on rail but no assist) Rolling Left: 1: +1 Total assist Supine to Sit: 1: +2 Total assist (mechanical lift used with head/neck support) Sit to Supine: 1: +2 Total assist Scooting to HOB: 1: +1 Total assist Transfers Transfers: No (mechanical lift: bed<->w/c (Tilt-in-Space) with gel pad) Locomotion  Ambulation Ambulation: No (no LE motor function) Gait Gait: No (No LE motor  function) Stairs / Additional Locomotion Stairs: No (NA) Wheelchair Mobility Wheelchair Mobility: No (UE's present mixed C5,6 level of dysfunction/paresis)  Trunk/Postural Assessment  Postural Control Postural Control:  Deficits on evaluation (No trunk control in sitting and UE weakness offers no support)  Balance Balance Balance Assessed: Yes Static Sitting Balance Static Sitting - Level of Assistance: 1: +1 Total assist (Patient c/o significant neck and shoulder pain when attempting to sit static ) Extremity Assessment  RLE Assessment RLE Assessment: Exceptions to WFL (PROM WFL, MMT: 0/5) LLE Assessment LLE Assessment: Exceptions to WFL (PROM: WFL, MMT: 0/5)  FIM:  FIM - Bed/Chair Transfer Bed/Chair Transfer: 1: Supine > Sit: Total A (helper does all/Pt. < 25%);1: Sit > Supine: Total A (helper does all/Pt. < 25%);1: Bed > Chair or W/C: Total A (helper does all/Pt. < 25%);1: Mechanical lift;1: Chair or W/C > Bed: Total A (helper does all/Pt. < 25%) FIM - Locomotion: Wheelchair Locomotion: Wheelchair: 1: Total Assistance/staff pushes wheelchair (Pt<25%) FIM - Locomotion: Ambulation Locomotion: Ambulation: 0: Activity did not occur   Refer to Care Plan for Long Term Goals  Recommendations for other services: None  Discharge Criteria: Patient will be discharged from PT if patient refuses treatment 3 consecutive times without medical reason, if treatment goals not met, if there is a change in medical status, if patient makes no progress towards goals or if patient is discharged from hospital.  The above assessment, treatment plan, treatment alternatives and goals were discussed and mutually agreed upon: by patient and by family  Clearence Ped 02/06/2014, 6:39 PM

## 2014-02-06 NOTE — Progress Notes (Signed)
Subjective/Complaints: 28 y.o. male with h/o DM type 1, diabetic neuropathy with sensory deficits, as well as BLE diabetic ulcers x 4 month, h/o left elbow MRSA boil as well as gastroparesis who was admitted on 01/29/14 with lethargy, BLE weakness and inability to ambulate. He was found to have bilateral LE cellulitis, possible PNA, Acute on chronic renal injury, leucocytosis with WBC 32k as well as hyperglycemia. He was started on IV antibiotics as well as insulin drip. 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 rate 18, SpO2 98.00%. Dg Chest 2 View  02/05/2014   CLINICAL DATA:  Follow-up pneumonia  EXAM: CHEST  2 VIEW  COMPARISON:  01/29/2014  FINDINGS: Cardiomegaly with possible mild interstitial edema.  Patchy right lower lobe opacity, likely atelectasis, pneumonia not excluded. Suspected small right pleural effusion.  Visualized osseous structures are within normal limits.  IMPRESSION: Cardiomegaly with possible mild interstitial edema.  Patchy right lower lobe opacity, likely atelectasis, pneumonia not excluded.  Small right pleural effusion.   Electronically Signed   By: Julian Hy M.D.   On: 02/05/2014 23:02   Dg Abd 1 View  02/05/2014   CLINICAL DATA:  Constipation  EXAM: ABDOMEN - 1 VIEW  COMPARISON:  01/29/2014  FINDINGS: Nonobstructive bowel gas pattern.  Normal colonic stool burden.  Visualized osseous structures are within normal limits.  IMPRESSION: Unremarkable abdominal radiograph.    Electronically Signed   By: Julian Hy M.D.   On: 02/05/2014 23:03   Mr Cervical Spine W Contrast  02/05/2014   CLINICAL DATA:  28 year old male with history of diabetes, gastric paresis, diabetic neuropathy. Onset of numbness and weakness in the lower extremities which progressed over less than 24 hr to the flaccid paralysis. Hyperglycemia on presentation. Abnormal cervical spine MRI with C4 to upper thoracic abnormal spinal cord signal. Being empirically treated for transverse myelitis. Initial encounter.  EXAM: MRI CERVICAL SPINE WITH CONTRAST; MRI THORACIC SPINE WITHOUT AND WITH CONTRAST  TECHNIQUE: Multiplanar and multiecho pulse sequences of the cervical spine, to include the craniocervical junction and cervicothoracic junction, were obtained according to standard protocol with intravenous contrast.; Multiplanar and multiecho pulse sequences of the thoracic spine were obtained without and with intravenous contrast.  CONTRAST:  19m MULTIHANCE GADOBENATE DIMEGLUMINE 529 MG/ML IV SOLN, 128mMULTIHANCE GADOBENATE DIMEGLUMINE 529 MG/ML IV SOLN  COMPARISON:  Brain and cervical spine MRI without contrast 01/29/2014.  FINDINGS: CERVICAL SPINE FINDINGS:  Axial in sagittal T1 weighted imaging of the cervical spine provided today. No cervical or cervicomedullary dural thickening or enhancement identified.  There is prominence of paravertebral venous plexus suspected about the cervical spinal cord. This is superimposed on increased dorsal epidural soft tissue which begins at C7. This dorsal signal seems most likely to be related to epidural fat, but there is T1 heterogeneity of the soft tissue at the cervicothoracic junction.  There is patchy/petechial abnormal hyper enhancement of the lower cervical spinal cord at the C6 level (series 16, image 5) extending up to the inferior C5 and lower C7 level. No other abnormal intra-axial enhancement.  THORACIC  SPINE FINDINGS:  Dorsal epidural lipomatosis, most pronounced  at the T4-T5 level. As noted above, a at the cervicothoracic junction this soft tissue which otherwise appears to be dorsal epidural fat has low T1 signal heterogeneity in it. There also seems to be some enhancement (series 12, image 7).  The abnormal cervical spinal cord expansion and T2 hyperintensity which previously extended only to the T1-T2 disc space now tracks caudally as far as T8, with intense increased T2 signal within the cord to the T5-T6 level. In these newly affected thoracic cord regions, the central gray matter is disproportionately affected (series 9, image 11, image 15). There is little CSF within the thecal sac at these levels probably due to combined cord expansion and epidural lipomatosis. No abnormal thoracic spinal cord enhancement identified.  No dural thickening or enhancement.  The lower thoracic cord has a more normal appearance from T9 to L1. The conus is incompletely visualized.  Normal thoracic vertebral height and alignment. No marrow edema or evidence of acute osseous abnormality.  Large layering bilateral pleural effusions with confluent increased post-contrast signal in the lungs. Visualized upper abdominal viscera are grossly negative.  IMPRESSION: 1. Progressive abnormal spinal cord signal, previously involving the lower cervical cord, but now extending throughout the upper and mid thoracic cord as far as T8. In the more recently affected thoracic levels, the central gray matter is disproportionately affected. This raises the possibility of spinal cord infarct. The main differential remains inflammatory myelitis. 2. There is mild patchy enhancement about the C6 cord level, which can be seen with both subacute infarct and transverse myelitis. 3. 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 This dorsal epidural fat is mildly heterogeneous at the cervicothoracic junction, felt related to mild edema within the fat. 4. No dura thickening or enhancement. No findings  suggestive of pyogenic spinal infection. Study discussed by telephone with Dr. Wallie Char at [phone] hr On 02/05/2014 .   Electronically Signed   By: Lars Pinks M.D.   On: 02/05/2014 08:13   Mr Thoracic Spine W Wo Contrast  02/05/2014   CLINICAL DATA:  28 year old male with history of diabetes, gastric paresis, diabetic neuropathy. Onset of numbness and weakness in the lower extremities which progressed over less than 24 hr to the flaccid paralysis. Hyperglycemia on presentation. Abnormal cervical spine MRI with C4 to upper thoracic abnormal spinal cord signal. Being empirically treated for transverse myelitis. Initial encounter.  EXAM: MRI CERVICAL SPINE WITH CONTRAST; MRI THORACIC SPINE WITHOUT AND WITH CONTRAST  TECHNIQUE: Multiplanar and multiecho pulse sequences of the cervical spine, to include the craniocervical junction and cervicothoracic junction, were obtained according to standard protocol with intravenous contrast.; Multiplanar and multiecho pulse sequences of the thoracic spine were obtained without and with intravenous contrast.  CONTRAST:  22m MULTIHANCE GADOBENATE DIMEGLUMINE 529 MG/ML IV SOLN, 184mMULTIHANCE GADOBENATE DIMEGLUMINE 529 MG/ML IV SOLN  COMPARISON:  Brain and cervical spine MRI without contrast 01/29/2014.  FINDINGS: CERVICAL SPINE FINDINGS:  Axial in sagittal T1 weighted imaging of the cervical spine provided today. No cervical or cervicomedullary dural thickening or enhancement identified.  There is prominence of paravertebral venous plexus suspected about the cervical spinal cord. This is superimposed on increased dorsal epidural soft tissue which begins at C7. This dorsal signal seems most likely to be related to epidural fat, but there is T1 heterogeneity of the soft tissue at the cervicothoracic junction.  There is patchy/petechial abnormal hyper enhancement of the lower cervical spinal cord at the C6 level (  series 16, image 5) extending up to the inferior C5 and lower C7  level. No other abnormal intra-axial enhancement.  THORACIC SPINE FINDINGS:  Dorsal epidural lipomatosis, most pronounced at the T4-T5 level. As noted above, a at the cervicothoracic junction this soft tissue which otherwise appears to be dorsal epidural fat has low T1 signal heterogeneity in it. There also seems to be some enhancement (series 12, image 7).  The abnormal cervical spinal cord expansion and T2 hyperintensity which previously extended only to the T1-T2 disc space now tracks caudally as far as T8, with intense increased T2 signal within the cord to the T5-T6 level. In these newly affected thoracic cord regions, the central gray matter is disproportionately affected (series 9, image 11, image 15). There is little CSF within the thecal sac at these levels probably due to combined cord expansion and epidural lipomatosis. No abnormal thoracic spinal cord enhancement identified.  No dural thickening or enhancement.  The lower thoracic cord has a more normal appearance from T9 to L1. The conus is incompletely visualized.  Normal thoracic vertebral height and alignment. No marrow edema or evidence of acute osseous abnormality.  Large layering bilateral pleural effusions with confluent increased post-contrast signal in the lungs. Visualized upper abdominal viscera are grossly negative.  IMPRESSION: 1. Progressive abnormal spinal cord signal, previously involving the lower cervical cord, but now extending throughout the upper and mid thoracic cord as far as T8. In the more recently affected thoracic levels, the central gray matter is disproportionately affected. This raises the possibility of spinal cord infarct. The main differential remains inflammatory myelitis. 2. There is mild patchy enhancement about the C6 cord level, which can be seen with both subacute infarct and transverse myelitis. 3. 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 This dorsal epidural fat is mildly heterogeneous at the  cervicothoracic junction, felt related to mild edema within the fat. 4. No dura thickening or enhancement. No findings suggestive of pyogenic spinal infection. Study discussed by telephone with Dr. Wallie Char at [phone] hr On 02/05/2014 .   Electronically Signed   By: Lars Pinks M.D.   On: 02/05/2014 08:13   Results for orders placed during the hospital encounter of 02/05/14 (from the past 72 hour(s))  GLUCOSE, CAPILLARY     Status: Abnormal   Collection Time    02/05/14  8:42 PM      Result Value Ref Range   Glucose-Capillary 68 (*) 70 - 99 mg/dL   Comment 1 Notify RN    GLUCOSE, CAPILLARY     Status: Abnormal   Collection Time    02/05/14  9:01 PM      Result Value Ref Range   Glucose-Capillary 59 (*) 70 - 99 mg/dL   Comment 1 Notify RN    GLUCOSE, CAPILLARY     Status: Abnormal   Collection Time    02/05/14  9:13 PM      Result Value Ref Range   Glucose-Capillary 59 (*) 70 - 99 mg/dL  GLUCOSE, CAPILLARY     Status: Abnormal   Collection Time    02/05/14  9:58 PM      Result Value Ref Range   Glucose-Capillary 61 (*) 70 - 99 mg/dL   Comment 1 Notify RN    GLUCOSE, CAPILLARY     Status: None   Collection Time    02/05/14 10:56 PM      Result Value Ref Range   Glucose-Capillary 94  70 - 99 mg/dL  Comment 1 Notify RN    GLUCOSE, CAPILLARY     Status: Abnormal   Collection Time    02/06/14  7:22 AM      Result Value Ref Range   Glucose-Capillary 115 (*) 70 - 99 mg/dL     HEENT: normal Cardio: RRR and no murmur Resp: CTA B/L and unlabored GI: BS positive and mildly distended and tympanitic, mild tenderness Extremity:  No Edema Skin:   Intact Neuro: Alert/Oriented, Abnormal Sensory absent in feet and Abnormal Motor 0/5 in LE,0/5 finger extension and 0/5 hand intrinsic Musc/Skel:  Normal Gen NAD   Assessment/Plan: 1. Functional deficits secondary to tetraparesis which require 3+ hours per day of interdisciplinary therapy in a comprehensive inpatient rehab  setting. Physiatrist is providing close team supervision and 24 hour management of active medical problems listed below. Physiatrist and rehab team continue to assess barriers to discharge/monitor patient progress toward functional and medical goals. FIM:                   Comprehension Comprehension Mode: Auditory Comprehension: 5-Understands basic 90% of the time/requires cueing < 10% of the time  Expression Expression Mode: Verbal Expression: 5-Expresses basic needs/ideas: With no assist  Social Interaction Social Interaction: 5-Interacts appropriately 90% of the time - Needs monitoring or encouragement for participation or interaction.  Problem Solving Problem Solving: 5-Solves basic 90% of the time/requires cueing < 10% of the time  Memory Memory: 5-Requires cues to use assistive device  Medical Problem List and Plan:  1. 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 DVT Prophylaxis/Anticoagulation: Pharmaceutical: Lovenox  3. Chronic Pain Management: Continue MS contin 30 mg bid. Air bed for back and buttock pain as well as pressure relief measures.  4. Mood: Patient with poor insight and awareness of deficits. He is highly anxious. Will monitor as activity increases. LCSW to follow for evaluation and support. Treat anxiety as needed.  5. Neuropsych: This patient is capable of making decisions on his own behalf.  6. Spinal cord infarct: Initiate steroid taper and monitor for BS changes as dose decreases.--can taper steroids quickly  7. DM type 2 with gastroparesis: Brittle diabetic per mother--variable intake due to GI symptoms/gastroparesis with subsequent wide fluctuations. Resume lantus insulin-10 units/HS with four units tid ac for meal coverage. Monitor and adjust as steroid tapers off.  8. ChronicBLE wounds with chronically elevated ESR: continue doxycline for 30 days (started 02/02/14). Follow  up with ID after discharge. Continue local wound care.  9. Constipation/diabetic gastroparesis/neurogenic bowel: Will resume reglan to help with constipation as well as GI symptoms . SSE today. Will check baseline KUB as no BM for a week. Will initiate bowel program.  10. Severe diabetic peripheral neuropathy: Continue neurontin 300 mg at bedtime. Will likely need titration upward for pain control especially given his chronic pain syndrome and use of narcs.  11. Pulmonary: poor secretion management, weak cough- discussed deep breathing -respiratory therapist and mother working on clearance of secretions and coughing techniques  -keep hob at 30 degrees  -provide suction at bedside which may relieve some of anxiety and help him orally to clear secretions.  -check CXR most consistent with atelectasis, monitor secretion and fever curve, CBC   LOS (Days) 1 A FACE TO FACE EVALUATION WAS PERFORMED  Charlett Blake 02/06/2014, 7:51 AM

## 2014-02-06 NOTE — Evaluation (Signed)
Occupational Therapy Assessment and Plan  Patient Details  Name: Jason [name] MRN: [id number] Date of Birth: [birth year]  OT Diagnosis: acute pain and quadriparesis at level C 5-6 Rehab Potential: Rehab Potential: Fair ELOS: 10-14 day   Today's Date: 02/06/2014 Time: 1100-1130 and 1100-1130 Time Calculation (min): 30 min and 30 min  Problem List:  Patient Active Problem List   Diagnosis Date Noted  . 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 Diabetic foot ulcer 02/04/2014  . Oral thrush 02/04/2014  . Leg weakness 01/30/2014  . Bilateral lower leg cellulitis 01/30/2014  . Altered mental status 01/30/2014  . 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 HCAP (healthcare-associated pneumonia) 01/29/2014  . DKA (diabetic ketoacidoses) 12/24/2013  . DM type 1 causing leg or foot ulcer 10/04/2013  . Rhinovirus infection 10/03/2013  . Protein-calorie malnutrition, severe 09/24/2013  . Aspiration pneumonia 09/23/2013  . Gastroparesis due to DM 06/02/2013  . Chronic low back pain 04/18/2013  . Painful diabetic neuropathy 03/20/2013  . Traumatic ecchymosis of toe 01/29/2013  . Tobacco abuse 01/29/2013  . Noncompliance with diet and medication regimen 01/29/2013  . VIRAL URI 09/15/2007  . GERD 09/15/2007  . Type 1 diabetes, uncontrolled, with neuropathy 05/29/2007  . ASTHMA 05/29/2007    Past Medical History:  Past Medical History  Diagnosis Date  . Diabetes mellitus   . GERD (gastroesophageal reflux disease)   . Asthma   . Hx MRSA infection     on face  . Gastroparesis   . Diabetic neuropathy   . Seizures    Past Surgical History:  Past Surgical History  Procedure Laterality Date  . Tonsillectomy      Assessment & Plan Clinical Impression:  Jason [name] is a 28 y.o. male with h/o DM type 1, diabetic neuropathy with sensory deficits, as well as BLE diabetic ulcers x 4 month, h/o left elbow MRSA boil as well as gastroparesis who was admitted on 01/29/14 with lethargy, BLE  weakness and inability to ambulate. He was found to have bilateral LE cellulitis, possible PNA, Acute on chronic renal injury, leucocytosis with WBC 32k as well as hyperglycemia. He was started on IV antibiotics as well as insulin drip. 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 Patient has worsening of MS with progressive BUE weakness on 01/20/14 due to SIRS/sepsis and treatment broadened with addition of fluconazole and acyclovir per ID input. LP with elevated glucose-132 and protein-111 and cultures negative so far. He was started on IV solumedrol with recommendation for repeat MRI cervical and thoracic spine pending.   Antibiotics de-escalated and Dr. Tommy Medal recommends 30 day course of doxycyline for chronic non-healing ulcers BLE with persistently elevated ESR as well as ABI for evaluation of BLE circulation. WOC consulted for input on BLE ulcers and feels likely vasculitic in nature--santyl added for chemical debridement. 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 Follow up MRI of cervical and thoracic spine done yesterday revealing progressive abnormal spina cord signal now extending throughout upper and mid thoracic cord as far as T8 with mild patchy enhancement about C6 cord level question spinal cord infarct v/s transverse myelitis. Neurology feels that patient with symptoms c/w spinal cord infarct and recommends steroid taper and no  other treatment indicated at this time. ABI BLE done yesterday revealing decrease flow anterior tibialis.  Therapy ongoing and CIR recommended by rehab team. Patient transferred to CIR on 02/05/2014 .    Patient currently requires total  with basic self-care skills secondary to muscle paralysis, decreased oxygen support, abnormal tone and decreased sitting balance and decreased postural control.  Prior to hospitalization, patient was fully independent and driving, not working.  Patient will benefit from skilled intervention to decrease level of assist with basic self-care skills prior to discharge home with care partner.  Anticipate patient will require 24 hour supervision and total physical assist with mobility and follow up home health.  OT - End of Session Activity Tolerance: Tolerates < 10 min activity, no significant change in vital signs OT Assessment Rehab Potential: [place] OT Patient demonstrates impairments in the following area(s): Balance;Endurance;Motor;Skin Integrity;Sensory;Safety;Pain OT Basic ADL's Functional Problem(s): Bathing;Dressing;Toileting;Eating;Grooming OT Transfers Functional Problem(s): Toilet OT Additional Impairment(s): Fuctional Use of Upper Extremity OT Plan OT Intensity: Minimum of 1-2 x/day, 45 to 90 minutes OT Frequency: 5 out of 7 days OT Duration/Estimated Length of Stay: 10-14 day OT Treatment/Interventions: Balance/vestibular training;Discharge planning;DME/adaptive equipment instruction;Functional mobility training;Neuromuscular re-education;Pain management;Patient/family education;Self Care/advanced ADL retraining;Skin care/wound managment;Psychosocial support;Therapeutic Activities;Therapeutic Exercise;UE/LE Strength taining/ROM OT Self Feeding Anticipated Outcome(s): set up OT Basic Self-Care Anticipated Outcome(s): min A grooming, min A UB dressing, mod A bathing OT Toileting Anticipated Outcome(s): N/A OT Bathroom Transfers Anticipated Outcome(s): Pt will direct his own self care with min VC for BSC transfers using hoyer lift. OT Recommendation Patient destination: Home Follow Up Recommendations: Home health OT Equipment Recommended: 3 in 1 bedside comode;Other (comment) Equipment  Details: hoyer lift   Skilled Therapeutic Intervention Visit 1: Pain:  Pt c/o extreme pain in back of neck and tailbone in sitting.   Pt seen for initial evaluation and initiation of self care with A from pt's mother and facilitation of trunk control. Pt needs a great deal of encouragement to participate as he is very focused on his pain.  Pt was sat to EOB for 8-10 min with total A, his mother A with washing his UB. Pt c/o extreme neck pain in sitting. Pt was adjusted into bed to work on rolling L/R to don brief, no clothing present.  Pt was not able to A, but did try reaching over to rails to pull himself over.  He has great difficulty with head/neck movement, as he can not lift head up off bed.  Pt was adjusted in bed for breakfast.    Visit 2:  Pain.: c/o pain in upper neck and shoulders. Requested soft neck collar and Kpads from nursing.  Pt was in tilt and space w/c and was taken to the day room to work on BUE AROM exercises. His mother was present and the rehab POC was briefly discussed with her for PT/OT expectations. She stated she understood our plan and that the plan could change based on the pt's physical progress. Pt needed a great deal of encouragement to participate.  He eventually did work on some A/Arom and self ROM exercises. He is extremely sensitive to touch around his shoulders. Attempted to support his shoulder with rolled towel behind them but he did not find that comfortable. Arms positioned on pillows. Pt wanted to stay in the day room with his mother.   OT Evaluation Precautions/Restrictions  Precautions Precautions: Fall Precaution Comments: high risk for skin breakdown Other Brace/Splint: Bilateral Prevalon boots Restrictions Weight Bearing Restrictions: No   Vital  Signs Oxygen Therapy SpO2: 98 % O2 Device: Nasal cannula O2 Flow Rate (L/min): 4 L/min Pulse Oximetry Type: Intermittent Pain Pain Assessment Pain Assessment: 0-10 Pain Score:  (resting) Pain  Type: Acute pain Pain Location: Shoulder Pain Orientation: Left Pain Descriptors / Indicators: Aching;Sore Pain Onset: On-going Pain Intervention(s): Medication (See eMAR) Multiple Pain Sites: Yes (Tailbone: 7/10) Home Living/Prior Functioning Home Living Living Arrangements: Parent Available Help at Discharge: Family;Available 24 hours/day Type of Home: House (Rented) Home Access: Level entry Home Layout: One level Additional Comments:  (RW, cane)  Lives With: Significant other (fiance ([name])) Prior Function Level of Independence: Independent with basic ADLs;Independent with gait (hopspitalized in Dec 2014 and again in Feb 2015)  Able to Take Stairs?: No Driving: Yes Vocation: Unemployed (was trying to get disability) ADL ADL ADL Comments: refer to [employer] to FIM Vision/Perception  Vision- History Baseline Vision/History: No visual deficits Patient Visual Report: No change from baseline Vision- Assessment Vision Assessment?: No apparent visual deficits  Cognition Overall Cognitive Status: Within Functional Limits for tasks assessed Orientation Level: Oriented X4 Behaviors: Poor frustration tolerance Sensation Sensation Light Touch: Impaired by gross assessment Stereognosis: Impaired by gross assessment Hot/Cold: Not tested Proprioception: Impaired by gross assessment Additional Comments: By gross assessment, pt does not have sensation below his sternum level.  He is hypersensitive to pain and touch in shoulders and neck. Coordination Gross Motor Movements are Fluid and Coordinated: No Fine Motor Movements are Fluid and Coordinated: No Coordination and Movement Description: Pt does not have leg movement, very limited finger movement with finger flexion tightness.  Slight pincer grasp, poor finger extension. Motor  Motor Motor: Tetraplegia Motor - Skilled Clinical Observations: pt can elevate shoulders and lift arms. Mobility    total A Trunk/Postural Assessment   Cervical Assessment Cervical Assessment: Exceptions to [facility] Cervical AROM Overall Cervical AROM Comments: Pt will not actively move head due to neck pain. Lumbar Assessment Lumbar Assessment:  (Pt does not have active trunk control.)  Balance Static Sitting Balance Static Sitting - Level of Assistance: 1: +1 Total assist Extremity/Trunk Assessment RUE Assessment RUE Assessment: Exceptions to [facility] RUE Strength RUE Overall Strength Comments: 4-/5 in shoulder flex, elbow flex, tricep extension. 3/5 wrist. Minimal finger extension. Slight pincer grasp. LUE Assessment LUE Assessment: Exceptions to [facility] LUE Strength LUE Overall Strength Comments: 3+/5 shoulder flex, elbow flex and triceps ext. 3/5 wrist extension, limited finger extension, slight pincer grasp  FIM:  FIM - Grooming Grooming Steps: Wash, rinse, dry face Grooming: 2: Patient completes 1 of 4 or 2 of 5 steps FIM - Bathing Bathing: 1: Total-Patient completes 0-2 of 10 parts or less than 25% FIM - Upper Body Dressing/Undressing Upper body dressing/undressing: 0: Wears gown/pajamas-no public clothing FIM - Lower Body Dressing/Undressing Lower body dressing/undressing: 0: Wears Interior and spatial designer FIM - Toileting Toileting: 0: Activity did not occur FIM - Air cabin crew Transfers: 0-Activity did not occur FIM - Camera operator Transfers: 0-Activity did not occur or was simulated   Refer to Care Plan for Long Term Goals  Recommendations for other services: None  Discharge Criteria: Patient will be discharged from OT if patient refuses treatment 3 consecutive times without medical reason, if treatment goals not met, if there is a change in medical status, if patient makes no progress towards goals or if patient is discharged from hospital.  The above assessment, treatment plan, treatment alternatives and goals were discussed and mutually agreed upon: by patient and by family (pt's  mother).  Geoffery Lyons  [place] 02/06/2014, 1:32 PM

## 2014-02-06 NOTE — Progress Notes (Signed)
Physical Therapy Session Note  Patient Details  Name: Jason [name] MRN: [id number] Date of Birth: [birth year]  Today's Date: 02/06/2014 Time: 1500-1540 Time Calculation (min): 40 min  Short Term Goals: Week 1:     Skilled Therapeutic Interventions/Progress Updates:  Performed PROM B LEs as tolerated, AAROM to AROM B UEs. Pt sitting up in tilt in space chair, respositioned for pressure relief prior to end of treatment.   Therapy Documentation Precautions:  Precautions Precautions: Fall Precaution Comments: high risk for skin breakdown Other Brace/Splint: Bilateral Prevalon boots Restrictions Weight Bearing Restrictions: No General: Pain: Pt c/o pain L shoulder.   See FIM for current functional status  Therapy/Group: Individual Therapy  Dub Amis 02/06/2014, 3:53 PM

## 2014-02-06 NOTE — Progress Notes (Signed)
About 1340 patient family reported after repositioning him in the chair, patient eyes started going side to side and patient reported not feeling well. Patient blood sugar was 110, blood pressure 132/89, Heart rate 83, and oxygen at 100 percent on 4L [place]. I did not note any odd movement upon my assesment after enering the room. Patient reported feeling better. Dr Letta Pate notified of patient's presentation at that time. No new orders received. Continue to monitor.

## 2014-02-06 NOTE — Progress Notes (Signed)
Hypoglycemic Event  CBG: 68  Treatment: 1 tube instant glucose and 15 GM carbohydrate snack  Symptoms: None  Follow-up CBG: Time:2256 CBG Result:94  Possible Reasons for Event: Inadequate meal intake.Medication regimen  Comments/MD notified dr. Letta Pate notifed and new orders noted.    Kelli Churn  Remember to initiate Hypoglycemia Order Set & complete

## 2014-02-07 ENCOUNTER — Inpatient Hospital Stay (HOSPITAL_COMMUNITY): Payer: Medicaid Other | Admitting: Physical Therapy

## 2014-02-07 DIAGNOSIS — M79609 Pain in unspecified limb: Secondary | ICD-10-CM

## 2014-02-07 LAB — GLUCOSE, CAPILLARY
GLUCOSE-CAPILLARY: 309 mg/dL — AB (ref 70–99)
Glucose-Capillary: 260 mg/dL — ABNORMAL HIGH (ref 70–99)
Glucose-Capillary: 259 mg/dL — ABNORMAL HIGH (ref 70–99)

## 2014-02-07 MED ORDER — INSULIN GLARGINE 100 UNIT/ML ~~LOC~~ SOLN
8.0000 [IU] | Freq: Every day | SUBCUTANEOUS | Status: DC
  Administered 2014-02-07: 8 [IU] via SUBCUTANEOUS
  Filled 2014-02-07 (×2): qty 0.08

## 2014-02-07 MED ORDER — BISACODYL 10 MG RE SUPP
10.0000 mg | Freq: Every day | RECTAL | Status: DC
  Administered 2014-02-08 – 2014-03-03 (×18): 10 mg via RECTAL
  Filled 2014-02-07 (×24): qty 1

## 2014-02-07 MED ORDER — DOCUSATE SODIUM 100 MG PO CAPS
100.0000 mg | ORAL_CAPSULE | Freq: Two times a day (BID) | ORAL | Status: DC
  Administered 2014-02-07 – 2014-02-08 (×3): 100 mg via ORAL
  Filled 2014-02-07 (×5): qty 1

## 2014-02-07 MED ORDER — GABAPENTIN 400 MG PO CAPS
400.0000 mg | ORAL_CAPSULE | Freq: Every day | ORAL | Status: DC
  Administered 2014-02-07 – 2014-02-22 (×16): 400 mg via ORAL
  Filled 2014-02-07 (×17): qty 1

## 2014-02-07 NOTE — Progress Notes (Signed)
Patient refused CPAP tonight. There Is a machine in the room at this time. RN aware. Explained to Patient that if they changed their mind, to just have the RN call Respiratory and we would come set them up. 

## 2014-02-07 NOTE — Progress Notes (Signed)
Physical Therapy Session Note  Patient Details  Name: Jason [name] MRN: [id number] Date of Birth: [birth year]  Today's Date: 02/07/2014 Time: 9937-1696 Time Calculation (min): 33 min  Short Term Goals: Week 1:  PT Short Term Goal 1 (Week 1): Patient will perfom Bed Mobility Max-assist x 2 PT Short Term Goal 2 (Week 1): Patient will perform Transfers Max-assist x 2 PT Short Term Goal 3 (Week 1): Dietitian with Verbal Understanding PT Short Term Goal 4 (Week 1): Static Sitting Balance EOB with Max-assist  Skilled Therapeutic Interventions/Progress Updates:  Pt was seen bedside in the pm. Pt c/o abd/gas pain willing to participate with ROM. Also notified by nurse tech of small area of skin breakdown on coccyx. Performed PROM B LEs and AROM B UEs.    Therapy Documentation Precautions:  Precautions Precautions: Fall Precaution Comments: high risk for skin breakdown Other Brace/Splint: Bilateral Prevalon boots Restrictions Weight Bearing Restrictions: No General: Amount of Missed PT Time (min): 12 Minutes Missed Time Reason: Patient fatigue;Pain  Pain: Pt c/o abd/gas pain.     See FIM for current functional status  Therapy/Group: Individual Therapy  Dub Amis 02/07/2014, 2:21 PM

## 2014-02-07 NOTE — IPOC Note (Addendum)
Overall Plan of Care [facility]) Patient Details Name: Jason [name] MRN: [id number] DOB: 08-02-1986  Admitting Diagnosis: [facility] Problems: Active Problems:   Spinal cord infarction     Functional Problem List: Nursing Bladder;Bowel;Edema;Medication Management;Motor;Nutrition;Pain;Safety;Skin Integrity;Sensory  PT Balance;Endurance;Motor;Pain;Safety;Skin Integrity  OT Balance;Endurance;Motor;Skin Integrity;Sensory;Safety;Pain  SLP    TR  activity tolerance, balance, safety, pain       Basic ADL's: OT Bathing;Dressing;Toileting;Eating;Grooming     Advanced  ADL's: OT       Transfers: PT Bed Mobility;Bed to Chair;Car;Furniture  OT Toilet     Locomotion: PT Wheelchair Mobility     Additional Impairments: OT Fuctional Use of Upper Extremity  SLP        TR      Anticipated Outcomes Item Anticipated Outcome  Self Feeding set up  Swallowing      Basic self-care  min A grooming, min A UB dressing, mod A bathing  Toileting  N/A   Bathroom Transfers Pt will direct his own self care with min VC for BSC transfers using hoyer lift.  Bowel/Bladder  Bowel and bladder max assist  Transfers  Total assist with family/caregiver support  Locomotion  Total assist W/C with family/caregiver support  Communication     Cognition     Pain  Pain level 3 or less on a scale of 0-10.  Safety/Judgment  Safety/jugement with min.Marland Kitchen assist   Therapy Plan: PT Intensity: Minimum of 1-2 x/day ,45 to 90 minutes PT Frequency: 5 out of 7 days PT Duration Estimated Length of Stay: 10-14 days OT Intensity: Minimum of 1-2 x/day, 45 to 90 minutes OT Frequency: 5 out of 7 days OT Duration/Estimated Length of Stay: 10-14 day         Team Interventions: Nursing Interventions Patient/Family Education;Pain Management;Bladder Management;Medication Management;Bowel Management;Skin Care/Wound Management;Disease Management/Prevention;Discharge Planning;Psychosocial Support  PT  interventions Balance/vestibular training;DME/adaptive equipment instruction;Functional mobility training;Pain management;Patient/family education;Therapeutic Activities  OT Interventions Balance/vestibular training;Discharge planning;DME/adaptive equipment instruction;Functional mobility training;Neuromuscular re-education;Pain management;Patient/family education;Self Care/advanced ADL retraining;Skin care/wound managment;Psychosocial support;Therapeutic Activities;Therapeutic Exercise;UE/LE Strength taining/ROM  SLP Interventions    TR Interventions  recreation/leisure participation, therapeutic activities, functional mobility, balance training, pain management, community reintegration, adaptive equipment use, pt/family education, discharge planning, psychosocial support  SW/CM Interventions      Team Discharge Planning: Destination: PT-Home ,OT- Home , SLP-  Projected Follow-up: PT-Home health PT, OT-  Home health OT, SLP-  Projected Equipment Needs: PT-3 in 1 bedside comode;To be determined, OT- 3 in 1 bedside comode;Other (comment), SLP-  Equipment Details: PT-To Be Determined: w/c type, and may require mechanical lift with slings, OT-hoyer lift Patient/family involved in discharge planning: PT- Patient;Family member/caregiver,  OT-Patient;Family member/caregiver, SLP-   MD ELOS: 23-30 Medical Rehab Prognosis:  Fair Assessment: 28 y.o. male with h/o DM type 1, diabetic neuropathy with sensory deficits, as well as BLE diabetic ulcers x 4 month, h/o left elbow MRSA boil as well as gastroparesis who was admitted on 01/29/14 with lethargy, BLE weakness and inability to ambulate. He was found to have bilateral LE cellulitis, possible PNA, Acute on chronic renal injury, leucocytosis with WBC 32k as well as hyperglycemia. He was started on IV antibiotics as well as insulin drip. 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  Treatment team will focus on ADLs and mobility with goals set at tot A x 1    See Team Conference Notes for weekly updates to the plan of care

## 2014-02-07 NOTE — Progress Notes (Signed)
Subjective/Complaints: 28 y.o. male with h/o DM type 1, diabetic neuropathy with sensory deficits, as well as BLE diabetic ulcers x 4 month, h/o left elbow MRSA boil as well as gastroparesis who was admitted on 01/29/14 with lethargy, BLE weakness and inability to ambulate. He was found to have bilateral LE cellulitis, possible PNA, Acute on chronic renal injury, leucocytosis with WBC 32k as well as hyperglycemia. He was started on IV antibiotics as well as insulin drip. 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 rate 18, weight 66.815 kg (147 lb 4.8 oz), SpO2 100.00%. Dg Chest 2 View  02/05/2014   CLINICAL DATA:  Follow-up pneumonia  EXAM: CHEST  2 VIEW  COMPARISON:  01/29/2014  FINDINGS: Cardiomegaly with possible mild interstitial edema.  Patchy right lower lobe opacity, likely atelectasis, pneumonia not excluded. Suspected small right pleural effusion.  Visualized osseous structures are within normal limits.  IMPRESSION: Cardiomegaly with possible mild interstitial edema.  Patchy right lower lobe opacity, likely atelectasis, pneumonia not excluded.  Small right pleural effusion.   Electronically Signed   By: Julian Hy M.D.   On: 02/05/2014 23:02   Dg Abd 1 View  02/05/2014   CLINICAL DATA:  Constipation  EXAM: ABDOMEN - 1 VIEW  COMPARISON:  01/29/2014  FINDINGS: Nonobstructive bowel gas pattern.  Normal colonic stool burden.  Visualized osseous structures are within normal limits.  IMPRESSION: Unremarkable  abdominal radiograph.   Electronically Signed   By: Julian Hy M.D.   On: 02/05/2014 23:03   Results for orders placed during the hospital encounter of 02/05/14 (from the past 72 hour(s))  GLUCOSE, CAPILLARY     Status: Abnormal   Collection Time    02/05/14  8:42 PM      Result Value Ref Range   Glucose-Capillary 68 (*) 70 - 99 mg/dL   Comment 1 Notify RN    GLUCOSE, CAPILLARY     Status: Abnormal   Collection Time    02/05/14  9:01 PM      Result Value Ref Range   Glucose-Capillary 59 (*) 70 - 99 mg/dL   Comment 1 Notify RN    GLUCOSE, CAPILLARY     Status: Abnormal   Collection Time    02/05/14  9:13 PM      Result Value Ref Range   Glucose-Capillary 59 (*) 70 - 99 mg/dL  GLUCOSE, CAPILLARY     Status: Abnormal   Collection Time    02/05/14  9:58 PM      Result Value Ref Range   Glucose-Capillary 61 (*) 70 - 99 mg/dL   Comment 1 Notify RN    GLUCOSE, CAPILLARY     Status: None   Collection Time    02/05/14 10:56 PM      Result Value Ref Range   Glucose-Capillary 94  70 - 99 mg/dL   Comment 1 Notify RN    GLUCOSE, CAPILLARY     Status: Abnormal   Collection Time    02/06/14  7:22 AM      Result Value Ref Range  Glucose-Capillary 115 (*) 70 - 99 mg/dL  GLUCOSE, CAPILLARY     Status: Abnormal   Collection Time    02/06/14  9:13 AM      Result Value Ref Range   Glucose-Capillary 104 (*) 70 - 99 mg/dL  GLUCOSE, CAPILLARY     Status: None   Collection Time    02/06/14 12:18 PM      Result Value Ref Range   Glucose-Capillary 98  70 - 99 mg/dL   Comment 1 Documented in Chart    GLUCOSE, CAPILLARY     Status: Abnormal   Collection Time    02/06/14  1:29 PM      Result Value Ref Range   Glucose-Capillary 110 (*) 70 - 99 mg/dL   Comment 1 Documented in Chart    GLUCOSE, CAPILLARY     Status: Abnormal   Collection Time    02/06/14  4:32 PM      Result Value Ref Range   Glucose-Capillary 192 (*) 70 - 99 mg/dL  GLUCOSE, CAPILLARY     Status: Abnormal    Collection Time    02/06/14  5:51 PM      Result Value Ref Range   Glucose-Capillary 213 (*) 70 - 99 mg/dL   Comment 1 Documented in Chart    GLUCOSE, CAPILLARY     Status: Abnormal   Collection Time    02/06/14  8:45 PM      Result Value Ref Range   Glucose-Capillary 266 (*) 70 - 99 mg/dL  GLUCOSE, CAPILLARY     Status: Abnormal   Collection Time    02/07/14  7:25 AM      Result Value Ref Range   Glucose-Capillary 309 (*) 70 - 99 mg/dL   Comment 1 Notify RN       HEENT: normal Cardio: RRR and no murmur Resp: CTA B/L and unlabored GI: BS positive and mildly distended and tympanitic, mild tenderness Extremity:  No Edema Skin:   Intact Neuro: Alert/Oriented, Abnormal Sensory absent in feet and Abnormal Motor 0/5 in LE,0/5 finger extension and 0/5 hand intrinsic Musc/Skel:  Normal Gen NAD   Assessment/Plan: 1. 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 Physiatrist is providing close team supervision and 24 hour management of active medical problems listed below. Physiatrist and rehab team continue to assess barriers to discharge/monitor patient progress toward functional and medical goals. FIM: FIM - Bathing Bathing: 1: Total-Patient completes 0-2 of 10 parts or less than 25%  FIM - Upper Body Dressing/Undressing Upper body dressing/undressing: 0: Wears gown/pajamas-no public clothing FIM - Lower Body Dressing/Undressing Lower body dressing/undressing: 0: Wears Interior and spatial designer  FIM - Toileting Toileting: 0: Activity did not occur  FIM - Air cabin crew Transfers: 0-Activity did not occur  FIM - IT sales professional Transfer: 1: Supine > Sit: Total A (helper does all/Pt. < 25%);1: Sit > Supine: Total A (helper does all/Pt. < 25%);1: Bed > Chair or W/C: Total A (helper does all/Pt. < 25%);1: Mechanical lift;1: Chair or W/C > Bed: Total  A (helper does all/Pt. < 25%)  FIM - Locomotion: Wheelchair Locomotion: Wheelchair: 1: Total Assistance/staff pushes wheelchair (Pt<25%) FIM - Locomotion: Ambulation Locomotion: Ambulation: 0: Activity did not occur  Comprehension Comprehension Mode: Auditory Comprehension: 5-Follows basic conversation/direction: With no assist  Expression Expression Mode: Verbal Expression: 5-Expresses basic needs/ideas: With extra time/assistive device  Social Interaction Social Interaction: 5-Interacts appropriately 90% of  the time - Needs monitoring or encouragement for participation or interaction.  Problem Solving Problem Solving: 5-Solves basic 90% of the time/requires cueing < 10% of the time  Memory Memory: 6-More than reasonable amt of time  Medical Problem List and Plan:  1. 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 DVT Prophylaxis/Anticoagulation: Pharmaceutical: Lovenox  3. Chronic Pain Management: Continue MS contin 30 mg bid. Air bed for back and buttock pain as well as pressure relief measures.  4. Mood: Patient with poor insight and awareness of deficits. He is highly anxious. Will monitor as activity increases. LCSW to follow for evaluation and support. Treat anxiety as needed.  5. Neuropsych: This patient is capable of making decisions on his own behalf.  6. Spinal cord infarct: Initiate steroid taper and monitor for BS changes as dose decreases.--can taper steroids quickly  7. DM type 2 with gastroparesis: Brittle diabetic per mother--variable intake due to GI symptoms/gastroparesis with subsequent wide fluctuations. Resume lantus insulin-10 units/HS with four units tid ac for meal coverage. Monitor and adjust as steroid tapers off.  8. ChronicBLE wounds with chronically elevated ESR: continue doxycline for 30 days (started 02/02/14). Follow up with ID after discharge. Continue local wound care.  9.  Constipation/diabetic gastroparesis/neurogenic bowel: Will resume reglan to help with constipation as well as GI symptoms . SSE today. Will check baseline KUB as no BM for a week. Will initiate bowel program.  10. Severe diabetic peripheral neuropathy: Continue neurontin 300 mg at bedtime. Will increase to 478m, the ankle pain is likely neurogenic   11. Pulmonary: poor secretion management, weak cough- discussed deep breathing -respiratory therapist and mother working on clearance of secretions and coughing techniques  -keep hob at 30 degrees  -provide suction at bedside which may relieve some of anxiety and help him orally to clear secretions.  -check CXR most consistent with atelectasis, monitor secretion and fever curve, CBC   LOS (Days) 2 A FACE TO FACE EVALUATION WAS PERFORMED  ACharlett Blake5/12/2013, 8:22 AM

## 2014-02-07 NOTE — Progress Notes (Signed)
VASCULAR LAB PRELIMINARY  PRELIMINARY  PRELIMINARY  PRELIMINARY  Bilateral lower extremity venous duplex completed.    Preliminary report:  Bilateral:  No evidence of DVT, superficial thrombosis, or Baker's Cyst.   Vilma Prader [place], RVS 02/07/2014, 9:54 AM

## 2014-02-08 ENCOUNTER — Inpatient Hospital Stay (HOSPITAL_COMMUNITY): Payer: Self-pay | Admitting: Rehabilitation

## 2014-02-08 ENCOUNTER — Inpatient Hospital Stay (HOSPITAL_COMMUNITY): Payer: Medicaid Other

## 2014-02-08 ENCOUNTER — Ambulatory Visit (HOSPITAL_COMMUNITY): Payer: Self-pay | Admitting: Rehabilitation

## 2014-02-08 ENCOUNTER — Inpatient Hospital Stay (HOSPITAL_COMMUNITY): Payer: Self-pay | Admitting: Physical Therapy

## 2014-02-08 DIAGNOSIS — G9519 Other vascular myelopathies: Secondary | ICD-10-CM

## 2014-02-08 DIAGNOSIS — E1149 Type 2 diabetes mellitus with other diabetic neurological complication: Secondary | ICD-10-CM

## 2014-02-08 DIAGNOSIS — K3184 Gastroparesis: Secondary | ICD-10-CM

## 2014-02-08 DIAGNOSIS — L02419 Cutaneous abscess of limb, unspecified: Secondary | ICD-10-CM

## 2014-02-08 DIAGNOSIS — L03119 Cellulitis of unspecified part of limb: Secondary | ICD-10-CM

## 2014-02-08 DIAGNOSIS — G8250 Quadriplegia, unspecified: Secondary | ICD-10-CM

## 2014-02-08 DIAGNOSIS — E291 Testicular hypofunction: Secondary | ICD-10-CM

## 2014-02-08 LAB — GLUCOSE, CAPILLARY
GLUCOSE-CAPILLARY: 238 mg/dL — AB (ref 70–99)
GLUCOSE-CAPILLARY: 161 mg/dL — AB (ref 70–99)
Glucose-Capillary: 32 mg/dL — CL (ref 70–99)
Glucose-Capillary: 254 mg/dL — ABNORMAL HIGH (ref 70–99)
Glucose-Capillary: 371 mg/dL — ABNORMAL HIGH (ref 70–99)
Glucose-Capillary: 268 mg/dL — ABNORMAL HIGH (ref 70–99)

## 2014-02-08 MED ORDER — POLYETHYLENE GLYCOL 3350 17 G PO PACK: 17.0000 g | PACK | Freq: Every day | ORAL | Status: DC

## 2014-02-08 MED ORDER — INSULIN ASPART 100 UNIT/ML ~~LOC~~ SOLN
6.0000 [IU] | Freq: Three times a day (TID) | SUBCUTANEOUS | Status: DC
  Administered 2014-02-08 – 2014-02-14 (×17): 6 [IU] via SUBCUTANEOUS

## 2014-02-08 MED ORDER — GABAPENTIN 100 MG PO CAPS
100.0000 mg | ORAL_CAPSULE | Freq: Two times a day (BID) | ORAL | Status: DC
  Administered 2014-02-08 – 2014-02-20 (×24): 100 mg via ORAL
  Filled 2014-02-08 (×27): qty 1

## 2014-02-08 MED ORDER — SACCHAROMYCES BOULARDII 250 MG PO CAPS
250.0000 mg | ORAL_CAPSULE | Freq: Two times a day (BID) | ORAL | Status: DC
  Administered 2014-02-08 – 2014-03-05 (×51): 250 mg via ORAL
  Filled 2014-02-08 (×54): qty 1

## 2014-02-08 MED ORDER — POLYETHYLENE GLYCOL 3350 17 G PO PACK
17.0000 g | PACK | Freq: Every day | ORAL | Status: DC
  Administered 2014-02-09 – 2014-02-17 (×6): 17 g via ORAL
  Filled 2014-02-08 (×12): qty 1

## 2014-02-08 MED ORDER — DOCUSATE SODIUM 100 MG PO CAPS
100.0000 mg | ORAL_CAPSULE | Freq: Two times a day (BID) | ORAL | Status: DC
  Administered 2014-02-08 – 2014-03-01 (×40): 100 mg via ORAL
  Filled 2014-02-08 (×46): qty 1

## 2014-02-08 MED ORDER — INSULIN GLARGINE 100 UNIT/ML ~~LOC~~ SOLN
10.0000 [IU] | Freq: Every day | SUBCUTANEOUS | Status: DC
  Administered 2014-02-08 – 2014-02-09 (×2): 10 [IU] via SUBCUTANEOUS
  Filled 2014-02-08 (×3): qty 0.1

## 2014-02-08 NOTE — Progress Notes (Signed)
Patient information reviewed and entered into e[facility] system by Lakesa Coste, RN, CRRN, PPS Coordinator.  Information including medical coding and functional independence measure will be reviewed and updated through discharge.    

## 2014-02-08 NOTE — Progress Notes (Signed)
Patient refused CPAP but finally agreed to give it a try. Placed patient on CPAP via nasal mask, settings on 10.0 cm H20.  Immediately, patient asked to be taken off due to "too much pressure". I offered to decreased pressure but refused saying he did not want to try again at this time.  Patients family member states he may try again tomorrow.  Placed patient on 2 lpm O2.

## 2014-02-08 NOTE — Progress Notes (Signed)
Physical Therapy Session Note  Patient Details  Name: Jason [name] MRN: [id number] Date of Birth: Jan 07, 1986  Today's Date: 02/08/2014 Time: 9242-6834 and [id number] Time Calculation (min): 48 min and 30 mins  Short Term Goals: Week 1:  PT Short Term Goal 1 (Week 1): Patient will perfom Bed Mobility Max-assist x 2 PT Short Term Goal 2 (Week 1): Patient will perform Transfers Max-assist x 2 PT Short Term Goal 3 (Week 1): Dietitian with Verbal Understanding PT Short Term Goal 4 (Week 1): Static Sitting Balance EOB with Max-assist  Skilled Therapeutic Interventions/Progress Updates:   First PM session: Pt received lying in bed, agreeable to therapy this afternoon.  Mother and fiance in room initially with many questions regarding D/C planning, equipment needs, and expected outcomes.  Discussed that goals are for pt to be as independent as possible, whether that be him moving as much as possible and also that he is able to direct all of his care if still needing high level of care at end of this week/beginning of next week.  Pt with very good questions regarding care and goals of therapy.  Also discussed need for regular manual w/c vs motorized chair.  Pt agreeable to work on bed mobility and sitting at EOB during session.  Pt able to hook UE on therapists arm with assist for LE in order to roll.  Discussed how he should be directing people that assist him, but still needs cues as to which LE to flex when rolling.  Once at EOB, requires max to total assist initially for sitting, however he was able to prop on extended arms at maintain balance at min/mod assist.  Also worked on shifting weight forward in order to prop on elbows on table.  Requires max assist to maintain forward weight shift due to increased pain in back and L shoulder.  Pt able to maintain sitting at EOB for approx 8-9 mins before needing to lie down due to pain.  Also note that pt became very claustrophobic  with having cervical brace donned, therefore removed during session.  Assisted back to supine with +2 assist and left in bed with UEs propped and all needs in reach.  OT in room for next session.    Second PM session:  Co-treatment during this session with OT in order to work on functional transfers and sitting balance on EOM in gym.  Introduced [employer] to pt in order to have pt decrease dependence during transfers.  Performed beezy board transfer x 3 reps with +2 assist (pt assist 10%) with cues for forward weight shift and hand placement to assist during transfer.  Unable to have pts feet on floor due to height of chair, therefore may need to place board under feet for increased stability.  Transferred to mat in therapy gym in order to work on sitting balance and trunk control.  Worked on forward/backwards trunk leans, propping on elbows on lap, forward propped on elbows on tray table and side to side propped on elbows.  Note pt with elevated R shoulder with increased pain in L shoulder.  Also note that he tends to keep head/neck rotated and laterally flexed towards the R.  Attempted to have pt correct, however pt with increased pain and unable to maintain straight alignment.  Pt remained on 2LO2 throughout session with SaO2 remaining in 90's throughout.  Pt assisted back to chair and back to room. Pt agreeable to sit in chair for 30-40 mins in tilted  position with lift pad underneath him.  All needs in reach and mother present in room.      Therapy Documentation Precautions:  Precautions Precautions: Fall Precaution Comments: high risk for skin breakdown Other Brace/Splint: Bilateral Prevalon boots Restrictions Weight Bearing Restrictions: No   Pain: Pain Assessment Pain Assessment: 0-10 Pain Score: 8  Pain Type: Acute pain Pain Location: Arm Pain Orientation: Left Pain Descriptors / Indicators: Aching;Heaviness;Throbbing Pain Frequency: Intermittent Pain Onset: On-going Patients Stated  Pain Goal: 2 Pain Intervention(s): Medication (See eMAR)     See FIM for current functional status  Therapy/Group: Individual Therapy (cotreatment with clinical specialist)  Raquel Sarna A Ryoma Nofziger 02/08/2014, 1:57 PM

## 2014-02-08 NOTE — Progress Notes (Signed)
Occupational Therapy Session Note  Patient Details  Name: Jason [name] MRN: [id number] Date of Birth: 21-Oct-1985  Today's Date: 02/08/2014 Time: 0930-1015 Time Calculation (min): 45 min  Short Term Goals: Week 1:  OT Short Term Goal 1 (Week 1): Pt will tolerate sitting on EOB for 10 min with mod A. OT Short Term Goal 2 (Week 1): Pt will use bath mit to wash face and chest. OT Short Term Goal 3 (Week 1): Pt will use a built up spoon to self feed with min A.  Skilled Therapeutic Interventions/Progress Updates:  ADL-retraining with focus on improved positioning, pain management, and functional use of bil UE during assisted ADL (bathing and dressing).   Patient received supine in bed, positioned leaning to his right against bed rail, hip/trunk rotated to right,  with his mother seated in recliner next to him.   Patient was alert and oriented, receptive for treatment but complaining of abdominal discomfort, suspicious for indigestion with gas.   Patient initially agreed to assisted dressing and transfer to reclining w/c for improved positioniing but was unable to tolerate any movement and complained of disabling pain at left UE.  RN contacted.   Patient declined setup for bathing stating RN tech had thoroughly bathed him (contradicted by his mother) but he agreed to assist with skin care (applying lotion to bil LE and left UE).   During patient education relating to pain control, patient reported recurrent sensations recently resembling electric shocks from trunk down through legs which he felt he could control somewhat.  Patient was introduced to principles of Reiki (energy-based) treatment and was receptive to follow-up visit to address pain control using hands-off treatment methods promoted.   Patient was advised that methods to be used, i.e. Body-scan, clearing blockages or facilitating balance to energy centers (Chakras), may or may not improve status of pain and patient verbalized understanding  stating, "it's worth a try."    Therapy Documentation Precautions:  Precautions Precautions: Fall Precaution Comments: high risk for skin breakdown Other Brace/Splint: Bilateral Prevalon boots Restrictions Weight Bearing Restrictions: No  General: General Amount of Missed OT Time (min): 15 Minutes  Pain: Pain Assessment Pain Assessment: 0-10 Pain Score: 9  Pain Type: Acute pain Pain Location: Arm Pain Orientation: Left Pain Descriptors / Indicators: Aching;Heaviness;Throbbing Pain Frequency: Intermittent Pain Onset: On-going Patients Stated Pain Goal: 2 Pain Intervention(s): Medication (See eMAR)  ADL: ADL ADL Comments: refer to FIM  See FIM for current functional status  Therapy/Group: Individual Therapy  Second session: Time: 1345-1415 Time Calculation (min):  30 min  Pain Assessment: 5/10, right lower back (provided positioning support, pillow)  Skilled Therapeutic Interventions: Therapeutic activity with focus on improved pain management using energy-based assessment and treatment using Reiki hands-off method.   Patient received supine in bed, following physical therapy session.   Full body scan completed with treatment provided to address perceived imbalance/blockage at solar plexus and sacral chakras.   Patient and family members (fiance and mother) educated on treatment methods and possible benefit.  See FIM for current functional status  Therapy/Group: Individual Therapy  Salome Spotted 02/08/2014, 11:22 AM

## 2014-02-08 NOTE — Progress Notes (Signed)
Patient refusing MetoCLOPramid (REGLAN) 10gm/mL solution 5mg , stating he only needs it "once in a while." Notified Pam Love, PA-C. Will continue to monitor.

## 2014-02-08 NOTE — Progress Notes (Addendum)
Occupational Therapy Session Note  Patient Details  Name: Jason [name] MRN: [id number] Date of Birth: 1986/01/14  Today's Date: 02/08/2014 Time: 7628-3151 Time Calculation (min): 30 min  Skilled Therapeutic Interventions/Progress Updates:    Pt seen for co-tx session with PT.  Worked on transferring supine to sit EOB to begin session.  Pt needing max instructional cueing and max assist to sequence and perform rolling to his side for placement of bed pad as well as in preparation for sitting.  He was able to transition sidelying to sit with total assist +2 (pt 25%).  Mr. Desha currently needs total assist to maintain sitting balance statically EOB.  Worked on Veterinary surgeon transfers using [employer].  Pt needing total assist +2 (pt 20%) for placement of board as well as total assist +2 (pt 10%) for scoot across the board to his wheelchair.  Worked on transfer again in the gym to the therapy mat.  After transfer and sitting EOM pt reporting increased pain in his left shoulder, neck, and lower sacrum.  Attempted to work on pressure relief by having pt flex his trunk forward onto his arms and the bedside table but unable to obtain any relief.  Sat on EOM for 10 mins with total assist.  Worked on simple trunk activation for trunk flexion and extension.  Pt needing total assist for static sitting balance.  Transferred back to the wheelchair via the [place] with total assist +2 (pt 10%) and tilted chair back to allow for pressure relief.  Pt left in chair in room with mom present and call bell within reach.    Therapy Documentation Precautions:  Precautions Precautions: Fall Precaution Comments: high risk for skin breakdown Required Braces or Orthoses: Other Brace/Splint Other Brace/Splint: Bilateral Prevalon boots Restrictions Weight Bearing Restrictions: No  Pain: Pain Assessment Pain Assessment: Faces Pain Score: 9  Faces Pain Scale: Hurts even more Pain Type: Acute pain Pain  Location: Shoulder Pain Orientation: Left Pain Descriptors / Indicators: Aching Pain Frequency: Intermittent Pain Onset: Gradual Patients Stated Pain Goal: 2 Pain Intervention(s): Repositioned;RN made aware 2nd Pain Site Wong-Baker Pain Rating: Hurts even more Pain Location: Sacrum Pain Orientation: Lower ADL: ADL ADL Comments: refer to FIM  See FIM for current functional status  Therapy/Group: Co-tx with PT  Cindra Presume OTR/L 02/08/2014, 3:28 PM

## 2014-02-08 NOTE — Progress Notes (Signed)
Pt has refused use of cpap at this time.  He says he hates it and will not use it again.  RT left circuit and mask, but took machine from room.  RT explained it could be brought back if needed.  RT will continue to monitor.

## 2014-02-08 NOTE — Progress Notes (Signed)
Subjective/Complaints: 28 y.o. male with h/o DM type 1, diabetic neuropathy with sensory deficits, as well as BLE diabetic ulcers x 4 month, h/o left elbow MRSA boil as well as gastroparesis who was admitted on 01/29/14 with lethargy, BLE weakness and inability to ambulate. He was found to have bilateral LE cellulitis, possible PNA, Acute on chronic renal injury, leucocytosis with WBC 32k as well as hyperglycemia. He was started on IV antibiotics as well as insulin drip. MRI/MRA brain of the brain with Chiari 1 malformation with cerebellar tonsils located 9 mm inferior to the foramen magnum and MRI cervical spine with hyperintense intramedullary cystic lesion extending from the C4 through T1-2 level neurology feels finding most consistent with spinal cord infarct  Overall feeling better. Struggling with CPAP. Large loose bm yesterday A 12 point review of systems has been performed and if not noted above is otherwise negative.   Objective: Vital Signs: Blood pressure 156/90, pulse 87, temperature 98 F (36.7 C), temperature source Axillary, resp. rate 16, weight 66.769 kg (147 lb 3.2 oz), SpO2 100.00%. No results found. Results for orders placed during the hospital encounter of 02/05/14 (from the past 72 hour(s))  GLUCOSE, CAPILLARY     Status: Abnormal   Collection Time    02/05/14  8:42 PM      Result Value Ref Range   Glucose-Capillary 68 (*) 70 - 99 mg/dL   Comment 1 Notify RN    GLUCOSE, CAPILLARY     Status: Abnormal   Collection Time    02/05/14  9:01 PM      Result Value Ref Range   Glucose-Capillary 59 (*) 70 - 99 mg/dL   Comment 1 Notify RN    GLUCOSE, CAPILLARY     Status: Abnormal   Collection Time    02/05/14  9:13 PM      Result Value Ref Range   Glucose-Capillary 59 (*) 70 - 99 mg/dL  GLUCOSE, CAPILLARY     Status: Abnormal   Collection Time    02/05/14  9:58 PM      Result Value Ref Range   Glucose-Capillary 61 (*) 70 - 99 mg/dL   Comment 1 Notify RN    GLUCOSE,  CAPILLARY     Status: None   Collection Time    02/05/14 10:56 PM      Result Value Ref Range   Glucose-Capillary 94  70 - 99 mg/dL   Comment 1 Notify RN    GLUCOSE, CAPILLARY     Status: Abnormal   Collection Time    02/06/14  7:22 AM      Result Value Ref Range   Glucose-Capillary 115 (*) 70 - 99 mg/dL  GLUCOSE, CAPILLARY     Status: Abnormal   Collection Time    02/06/14  9:13 AM      Result Value Ref Range   Glucose-Capillary 104 (*) 70 - 99 mg/dL  GLUCOSE, CAPILLARY     Status: None   Collection Time    02/06/14 12:18 PM      Result Value Ref Range   Glucose-Capillary 98  70 - 99 mg/dL   Comment 1 Documented in Chart    GLUCOSE, CAPILLARY     Status: Abnormal   Collection Time    02/06/14  1:29 PM      Result Value Ref Range   Glucose-Capillary 110 (*) 70 - 99 mg/dL   Comment 1 Documented in Chart    GLUCOSE, CAPILLARY     Status:  Abnormal   Collection Time    02/06/14  4:32 PM      Result Value Ref Range   Glucose-Capillary 192 (*) 70 - 99 mg/dL  GLUCOSE, CAPILLARY     Status: Abnormal   Collection Time    02/06/14  5:51 PM      Result Value Ref Range   Glucose-Capillary 213 (*) 70 - 99 mg/dL   Comment 1 Documented in Chart    GLUCOSE, CAPILLARY     Status: Abnormal   Collection Time    02/06/14  8:45 PM      Result Value Ref Range   Glucose-Capillary 266 (*) 70 - 99 mg/dL  GLUCOSE, CAPILLARY     Status: Abnormal   Collection Time    02/07/14  7:25 AM      Result Value Ref Range   Glucose-Capillary 309 (*) 70 - 99 mg/dL   Comment 1 Notify RN    GLUCOSE, CAPILLARY     Status: Abnormal   Collection Time    02/07/14 11:39 AM      Result Value Ref Range   Glucose-Capillary 260 (*) 70 - 99 mg/dL   Comment 1 Notify RN    GLUCOSE, CAPILLARY     Status: Abnormal   Collection Time    02/07/14  4:47 PM      Result Value Ref Range   Glucose-Capillary 259 (*) 70 - 99 mg/dL   Comment 1 Documented in Chart    GLUCOSE, CAPILLARY     Status: Abnormal   Collection  Time    02/07/14  8:21 PM      Result Value Ref Range   Glucose-Capillary 254 (*) 70 - 99 mg/dL   Comment 1 Notify RN    GLUCOSE, CAPILLARY     Status: Abnormal   Collection Time    02/08/14  7:21 AM      Result Value Ref Range   Glucose-Capillary 371 (*) 70 - 99 mg/dL     HEENT: normal Cardio: RRR and no murmur Resp: CTA B/L and unlabored GI: BS positive and mildly distended and tympanitic, mild tenderness Extremity:  No Edema Skin:  Wounds on both ankles with eschar,debris, stage 2 on sacrum. Large elbow wound with eschar and fibronecrotic tissue Neuro: Alert/Oriented, Abnormal Sensory absent in feet and Abnormal Motor 0/5 in LE,0/5 finger extension and 0/5 hand intrinsic. Wrist extension 2-, tricep 2- to 2, biceps 5/5. Decreased sensation below elbows bilaterally Musc/Skel:  Normal Gen NAD   Assessment/Plan: 1. 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 Physiatrist is providing close team supervision and 24 hour management of active medical problems listed below. Physiatrist and rehab team continue to assess barriers to discharge/monitor patient progress toward functional and medical goals. FIM: FIM - Bathing Bathing: 1: Total-Patient completes 0-2 of 10 parts or less than 25%  FIM - Upper Body Dressing/Undressing Upper body dressing/undressing: 0: Wears gown/pajamas-no public clothing FIM - Lower Body Dressing/Undressing Lower body dressing/undressing: 0: Wears Interior and spatial designer  FIM - Toileting Toileting: 0: Activity did not occur  FIM - Air cabin crew Transfers: 0-Activity did not occur  FIM - IT sales professional Transfer: 0: Activity did not occur  FIM - Locomotion: Wheelchair Locomotion: Wheelchair: 1: Total Assistance/staff pushes wheelchair (Pt<25%) FIM - Locomotion: Ambulation Locomotion: Ambulation: 0: Activity did not  occur  Comprehension Comprehension Mode: Auditory Comprehension: 5-Follows basic conversation/direction: With no assist  Expression Expression Mode:  Verbal Expression: 5-Expresses basic needs/ideas: With no assist  Social Interaction Social Interaction: 5-Interacts appropriately 90% of the time - Needs monitoring or encouragement for participation or interaction.  Problem Solving Problem Solving: 5-Solves basic 90% of the time/requires cueing < 10% of the time  Memory Memory: 6-More than reasonable amt of time  Medical Problem List and Plan:  1. 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 DVT Prophylaxis/Anticoagulation: Pharmaceutical: Lovenox  3. Chronic Pain Management: Continue MS contin 30 mg bid. Air bed for back and buttock pain as well as pressure relief measures.  4. Mood: Patient with poor insight and awareness of deficits. He is highly anxious. Will monitor as activity increases. LCSW to follow for evaluation and support. Treat anxiety as needed.  5. Neuropsych: This patient is capable of making decisions on his own behalf.  6. Spinal cord infarct: Initiate steroid taper and monitor for BS changes as dose decreases.--can taper steroids quickly  7. DM type 2 with gastroparesis: Brittle diabetic per mother--variable intake due to GI symptoms/gastroparesis with subsequent wide fluctuations. Resumed lantus insulin-10 units/HS with four units tid ac for meal coverage. Monitor and adjust as steroid tapers off.   -needs further adjustment 8. ChronicBLE wounds with chronically elevated ESR: continue doxycline for 30 days (started 02/02/14). Follow up with ID after discharge. Continue local wound care.   -hydrotherapy to elbow? 9. Constipation/diabetic gastroparesis/neurogenic bowel: Will resume reglan to help with constipation as well as GI symptoms . SSE today. Will check baseline KUB as no BM for a week. Will  initiate bowel program.  10. Severe diabetic peripheral neuropathy: Continue neurontin 300 mg at bedtime. Will increase to 48m, the ankle pain is likely neurogenic    11. Pulmonary: poor secretion management, weak cough- discussed deep breathing, IS/FV, CPAP -respiratory therapist and mother working on clearance of secretions and coughing techniques  -keep hob at 30 degrees  -provide suction at bedside which may relieve some of anxiety and help him orally to clear secretions.  - CXR most consistent with atelectasis   -continue to monitor secretion and fever curve, CBC   LOS (Days) 3 A FACE TO FACE EVALUATION WAS PERFORMED  ZMeredith Staggers5/01/2014, 8:16 AM

## 2014-02-09 ENCOUNTER — Inpatient Hospital Stay (HOSPITAL_COMMUNITY): Payer: Medicaid Other | Admitting: Occupational Therapy

## 2014-02-09 ENCOUNTER — Inpatient Hospital Stay (HOSPITAL_COMMUNITY): Payer: Medicaid Other | Admitting: *Deleted

## 2014-02-09 ENCOUNTER — Inpatient Hospital Stay (HOSPITAL_COMMUNITY): Payer: Medicaid Other | Admitting: Physical Therapy

## 2014-02-09 LAB — GLUCOSE, CAPILLARY
GLUCOSE-CAPILLARY: 209 mg/dL — AB (ref 70–99)
Glucose-Capillary: 238 mg/dL — ABNORMAL HIGH (ref 70–99)
Glucose-Capillary: 86 mg/dL (ref 70–99)
Glucose-Capillary: 136 mg/dL — ABNORMAL HIGH (ref 70–99)

## 2014-02-09 MED ORDER — INSULIN GLARGINE 100 UNIT/ML ~~LOC~~ SOLN
5.0000 [IU] | Freq: Every day | SUBCUTANEOUS | Status: DC
  Administered 2014-02-09 – 2014-02-12 (×4): 5 [IU] via SUBCUTANEOUS
  Filled 2014-02-09 (×4): qty 0.05

## 2014-02-09 NOTE — Progress Notes (Signed)
Inpatient Diabetes Program Recommendations  AACE/ADA: New Consensus Statement on Inpatient Glycemic Control (2013)  Target Ranges:  Prepandial:   less than 140 mg/dL      Peak postprandial:   less than 180 mg/dL (1-2 hours)      Critically ill patients:  140 - 180 mg/dL   Reason for Visit: Hyperglycemia  Results for Jason [name], Jason [name] (MRN [id number]) as of 02/09/2014 14:50  Ref. Range 02/08/2014 11:53 02/08/2014 16:44 02/08/2014 21:10 02/09/2014 07:25 02/09/2014 11:22  Glucose-Capillary Latest Range: 70-99 mg/dL 238 (H) 268 (H) 161 (H) 238 (H) 209 (H)    Inpatient Diabetes Program Recommendations Insulin - Basal: Increase Lantus to 10 units bid Insulin - Meal Coverage: Increase Novolog to 8 units tidwc  Note: Will continue to follow. Thank you. Lorenda Peck, RD, LDN, CDE Inpatient Diabetes Coordinator [phone]

## 2014-02-09 NOTE — Progress Notes (Signed)
Occupational Therapy Session Notes  Patient Details  Name: Jason [name] MRN: [id number] Date of Birth: 09/05/1986  Today's Date: 02/09/2014 Time: 8270-7867 and 100-150 Time Calculation (min): 60 min and 50 min  Short Term Goals: Week 1:  OT Short Term Goal 1 (Week 1): Pt will tolerate sitting on EOB for 10 min with mod A. OT Short Term Goal 2 (Week 1): Pt will use bath mit to wash face and chest. OT Short Term Goal 3 (Week 1): Pt will use a built up spoon to self feed with min A.  Skilled Therapeutic Interventions/Progress Updates:  1)  Patient sleeping in bed upon arrival with his mother at his side.  Engaged in self care retraining to include sponge bath, dress and groom tasks.  Focused session on patient working through the pain in neck and left shoulder, learning to guide his care, bed mobility, increased BUE use, sitting balance with HOB up, use of AE to brush his teeth.  Patient able to bathe UB with set up and use of regular wash clothe and assist 2 times when wash cloth fell out of his hands, don shirt with assist only to pull shirt down in the back and attempting to pull up waistband of pants with HOB up and again in side lying to finish the job with assist.  Patient able to brush his teeth after set up and using built up foam handle on toothbrush to improve independence.  Patient asked his mother to assist with quad cough which she did.  Patient unable to cough up mucus this time stating, "it is too low".    2)  Patient resting in bed upon arrival with fiance feeding salad to him.  Reviewed need for patient to feed himself and he stated that he usually does.  Engaged in review of target discharge of 3-4 weeks depending on his progress, need for him to get out of the bed and sit in w/c, being responsible to ask for pressure relief to be performed when up in tilt in space w/c and bowel program.  Patient rolled to position Maxi-Move sling with assist to bend LEs for rolling and patient  placed in w/c with all items in reach and mother and fiance in room with reminder that he needs to ask for pressure relief every 20 minutes.  Patient declined offer for this OT to provide a timer.  Therapy Documentation Precautions:  Precautions Precautions: Fall Precaution Comments: high risk for skin breakdown Required Braces or Orthoses: Other Brace/Splint Other Brace/Splint: Bilateral Prevalon boots Restrictions Weight Bearing Restrictions: No Pain: 1)  Neck and left shoulder, not rated, premedicated.  Rest and repositioned. 2)  No c/o pain ADL: See FIM for current functional status  Therapy/Group: Individual Therapy  Gaye Pollack 02/09/2014, 2:47 PM

## 2014-02-09 NOTE — Progress Notes (Signed)
Spoke with patient about CPAP. Patient continuing to refuse. I told him I would be glad to bring him one again if he changed his mind.

## 2014-02-09 NOTE — Care Management Note (Signed)
Inpatient Rehabilitation Center Individual Statement of Services  Patient Name:  Jason [name]  Date:  02/09/2014  Welcome to the [facility].  Our goal is to provide you with an individualized program based on your diagnosis and situation, designed to meet your specific needs.  With this comprehensive rehabilitation program, you will be expected to participate in at least 3 hours of rehabilitation therapies Monday-Friday, with modified therapy programming on the weekends.  Your rehabilitation program will include the following services:  Physical Therapy (PT), Occupational Therapy (OT), 24 hour per day rehabilitation nursing, Therapeutic Recreaction (TR), Neuropsychology, Case Management (Social Worker), Rehabilitation Medicine, Nutrition Services and Pharmacy Services  Weekly team conferences will be held on Tuesdays to discuss your progress.  Your Social Worker will talk with you frequently to get your input and to update you on team discussions.  Team conferences with you and your family in attendance may also be held.  Expected length of stay: 3-4 weeks  Overall anticipated outcome: maximum assist  Depending on your progress and recovery, your program may change. Your Social Worker will coordinate services and will keep you informed of any changes. Your Social Worker's name and contact numbers are listed  below.  The following services may also be recommended but are not provided by the [facility] will be made to provide these services after discharge if needed.  Arrangements include referral to agencies that provide these services.  Your insurance has been verified to be:  Medicaid Your primary doctor is:  Dr. Sarajane Jews  Pertinent information will be shared with your doctor and your insurance  company.  Social Worker:  Lennart Pall, [place] or ([phone]   Information discussed with and copy given to patient by: Lennart Pall, 02/09/2014, 10:42 AM

## 2014-02-09 NOTE — Progress Notes (Signed)
Physical Therapy Session Note  Patient Details  Name: Jason [name] MRN: [id number] Date of Birth: 06/18/1986  Today's Date: 02/09/2014 Time: 0820-0900 and 1445-1530 Time Calculation (min): 40 min and 45 min  Short Term Goals: Week 1:  PT Short Term Goal 1 (Week 1): Patient will perfom Bed Mobility Max-assist x 2 PT Short Term Goal 2 (Week 1): Patient will perform Transfers Max-assist x 2 PT Short Term Goal 3 (Week 1): Dietitian with Verbal Understanding PT Short Term Goal 4 (Week 1): Static Sitting Balance EOB with Max-assist  Skilled Therapeutic Interventions/Progress Updates:   AM Session: Tx with PT tech for +2 assist. Focused on bed mobility and functional transfers. Pt able to direct care to therapist for some of bed mobility, using UE to hook on therapist's arm with PT tech holding flexed LE in place to assist with rolling to L. Pt able to prop up on elbow > fist with max assist. Pt requires mod-max assist for static sitting balance EOB. Therapist demonstrated use of locking out elbows to maintain balance using UEs but patient unable to extend elbows. Slideboard transfer bed > w/c attempted using beezy board. After initiating transfer with +2 assist, pt became anxious due to difficulty breathing and increase in coccyx pain with weightbearing, performing posterior weightshift while wearing gown resulting in pt's hips sliding forward off slideboard. Additionally, back rest of w/c not sufficiently held in place and coming forward while pt attempting to transfer into chair, blocking patient ability to complete transfer. Pt held in safe position by PT and tech but required call for additional person to assist pt back to bed with 3-person carry under each LE and at trunk. Pt returned to supine with total assist and repositioned in bed. Rolling R/L for improved positioning of sheets/gowns with mod assist and use of rails. Pt remained on 2L 02 throughout session. Patient  left with B prevalon boots donned, UEs propped, all needs within reach and mother present.   PM Session: Tx with PT tech for + 2 assist. W/c back rest fixed before session. Focused on pressure relief education, benefits of being out of bed, and functional transfers. Pt received semi reclined in tilt in space w/c with fiance and mother. Pt reporting severe pain in abdomen, continued difficulty breathing on 2 L02 via [place] with sats >90%, and L shoulder pressure related pain. Pt repositioned for comfort. Patient and caregivers educated on "out of bed form" to be filled out to achieve goal of 5 hrs/day out of bed for total of 35 hrs/week with documentation of position/aids/pressure relief boosting. Pt and caregivers verbalized understanding and in agreement with plan (posted on bathroom door for record keeping). Total transport in w/c from gym <> room where patient educated in and performed anterior, R and L lateral leans for pressure relief. Pt with difficulty performing anterior weightshift due to poor trunk control and abdominal pain/bloating. Will continue to reinforce. Pt able to verbalize pressure relief frequency every 20-30 minutes with weightshifts in all directions or being tilted back in w/c. Pt performed w/c <> mat table level slideboard transfer using beezy board with +2 total assist with verbal cues for head hips relationship and max assist to maintain sitting balance. Pt returned to room and left semi reclined in tilt in space w/c with mother present and call bell within reach.   Therapy Documentation Precautions:  Precautions Precautions: Fall Precaution Comments: high risk for skin breakdown Required Braces or Orthoses: Other Brace/Splint Other Brace/Splint: Bilateral Prevalon  boots Restrictions Weight Bearing Restrictions: No General: Amount of Missed PT Time (min): 20 Minutes Missed Time Reason: Nursing care;Other (comment) (RN medication administration and pt eating  breakfast) Pain: Pain Assessment Pain Assessment: 0-10 Pain Score: 7  Faces Pain Scale: Hurts whole lot Pain Type: Acute pain Pain Location: Coccyx Pain Orientation: Right Pain Descriptors / Indicators: Aching Pain Frequency: Intermittent Pain Onset: With Activity Patients Stated Pain Goal: 2 Pain Intervention(s): Repositioned;Rest  See FIM for current functional status  Therapy/Group: Individual Therapy  Laretta Alstrom 02/09/2014, 9:44 AM

## 2014-02-09 NOTE — Progress Notes (Signed)
Subjective/Complaints: 28 y.o. male with h/o DM type 1, diabetic neuropathy with sensory deficits, as well as BLE diabetic ulcers x 4 month, h/o left elbow MRSA boil as well as gastroparesis who was admitted on 01/29/14 with lethargy, BLE weakness and inability to ambulate. He was found to have bilateral LE cellulitis, possible PNA, Acute on chronic renal injury, leucocytosis with WBC 32k as well as hyperglycemia. He was started on IV antibiotics as well as insulin drip. MRI/MRA brain of the brain with Chiari 1 malformation with cerebellar tonsils located 9 mm inferior to the foramen magnum and MRI cervical spine with hyperintense intramedullary cystic lesion extending from the C4 through T1-2 level neurology feels finding most consistent with spinal cord infarct  Has trouble clearing secretions and with coughing. A 12 point review of systems has been performed and if not noted above is otherwise negative.   Objective: Vital Signs: Blood pressure 134/84, pulse 94, temperature 99 F (37.2 C), temperature source Oral, resp. rate 18, weight 66.225 kg (146 lb), SpO2 95.00%. No results found. Results for orders placed during the hospital encounter of 02/05/14 (from the past 72 hour(s))  GLUCOSE, CAPILLARY     Status: Abnormal   Collection Time    02/06/14  9:13 AM      Result Value Ref Range   Glucose-Capillary 104 (*) 70 - 99 mg/dL  GLUCOSE, CAPILLARY     Status: None   Collection Time    02/06/14 12:18 PM      Result Value Ref Range   Glucose-Capillary 98  70 - 99 mg/dL   Comment 1 Documented in Chart    GLUCOSE, CAPILLARY     Status: Abnormal   Collection Time    02/06/14  1:29 PM      Result Value Ref Range   Glucose-Capillary 110 (*) 70 - 99 mg/dL   Comment 1 Documented in Chart    GLUCOSE, CAPILLARY     Status: Abnormal   Collection Time    02/06/14  4:32 PM      Result Value Ref Range   Glucose-Capillary 192 (*) 70 - 99 mg/dL  GLUCOSE, CAPILLARY     Status: Abnormal   Collection Time    02/06/14  5:51 PM      Result Value Ref Range   Glucose-Capillary 213 (*) 70 - 99 mg/dL   Comment 1 Documented in Chart    GLUCOSE, CAPILLARY     Status: Abnormal   Collection Time    02/06/14  8:45 PM      Result Value Ref Range   Glucose-Capillary 266 (*) 70 - 99 mg/dL  GLUCOSE, CAPILLARY     Status: Abnormal   Collection Time    02/07/14  7:25 AM      Result Value Ref Range   Glucose-Capillary 309 (*) 70 - 99 mg/dL   Comment 1 Notify RN    GLUCOSE, CAPILLARY     Status: Abnormal   Collection Time    02/07/14 11:39 AM      Result Value Ref Range   Glucose-Capillary 260 (*) 70 - 99 mg/dL   Comment 1 Notify RN    GLUCOSE, CAPILLARY     Status: Abnormal   Collection Time    02/07/14  4:47 PM      Result Value Ref Range   Glucose-Capillary 259 (*) 70 - 99 mg/dL   Comment 1 Documented in Chart    GLUCOSE, CAPILLARY     Status: Abnormal   Collection  Time    02/07/14  8:21 PM      Result Value Ref Range   Glucose-Capillary 254 (*) 70 - 99 mg/dL   Comment 1 Notify RN    GLUCOSE, CAPILLARY     Status: Abnormal   Collection Time    02/08/14  7:21 AM      Result Value Ref Range   Glucose-Capillary 371 (*) 70 - 99 mg/dL  GLUCOSE, CAPILLARY     Status: Abnormal   Collection Time    02/08/14 11:53 AM      Result Value Ref Range   Glucose-Capillary 238 (*) 70 - 99 mg/dL  GLUCOSE, CAPILLARY     Status: Abnormal   Collection Time    02/08/14  4:44 PM      Result Value Ref Range   Glucose-Capillary 268 (*) 70 - 99 mg/dL  GLUCOSE, CAPILLARY     Status: Abnormal   Collection Time    02/08/14  9:10 PM      Result Value Ref Range   Glucose-Capillary 161 (*) 70 - 99 mg/dL  GLUCOSE, CAPILLARY     Status: Abnormal   Collection Time    02/09/14  7:25 AM      Result Value Ref Range   Glucose-Capillary 238 (*) 70 - 99 mg/dL   Comment 1 Notify RN       HEENT: normal Cardio: RRR and no murmur Resp: CTA B/L and unlabored GI: BS positive and mildly distended and  tympanitic, mild tenderness Extremity:  No Edema Skin:  Wounds on both ankles with eschar,debris, stage 2 on sacrum. Large elbow wound with eschar and fibronecrotic tissue Neuro: Alert/Oriented, Abnormal Sensory absent in feet and Abnormal Motor 0/5 in LE,0/5 finger extension and 0/5 hand intrinsic. Wrist extension 2-, tricep 2- to 2, biceps 5/5. Decreased sensation below elbows bilaterally Musc/Skel:  Normal Gen NAD   Assessment/Plan: 1. 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 Physiatrist is providing close team supervision and 24 hour management of active medical problems listed below. Physiatrist and rehab team continue to assess barriers to discharge/monitor patient progress toward functional and medical goals.   FIM: FIM - Bathing Bathing: 0: Activity did not occur  FIM - Upper Body Dressing/Undressing Upper body dressing/undressing: 0: Wears gown/pajamas-no public clothing FIM - Lower Body Dressing/Undressing Lower body dressing/undressing: 0: Wears Interior and spatial designer  FIM - Toileting Toileting: 0: Activity did not occur  FIM - Air cabin crew Transfers: 0-Activity did not occur  FIM - IT sales professional Transfer: 1: Two helpers  FIM - Locomotion: Wheelchair Locomotion: Wheelchair: 0: Activity did not occur FIM - Locomotion: Ambulation Locomotion: Ambulation: 0: Activity did not occur  Comprehension Comprehension Mode: Auditory Comprehension: 5-Follows basic conversation/direction: With extra time/assistive device  Expression Expression Mode: Verbal Expression: 5-Expresses basic needs/ideas: With no assist  Social Interaction Social Interaction: 5-Interacts appropriately 90% of the time - Needs monitoring or encouragement for participation or interaction.  Problem Solving Problem Solving: 5-Solves basic 90% of the  time/requires cueing < 10% of the time  Memory Memory: 6-More than reasonable amt of time  Medical Problem List and Plan:  1. 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 DVT Prophylaxis/Anticoagulation: Pharmaceutical: Lovenox  3. Chronic Pain Management: Continue MS contin 30 mg bid. Air bed for back and buttock pain as well as pressure relief measures.  4. Mood: Patient with poor  insight and awareness of deficits. He is highly anxious. Will monitor as activity increases. LCSW to follow for evaluation and support.    5. Neuropsych: This patient is capable of making decisions on his own behalf.  6. Spinal cord infarct: Initiate steroid taper and monitor for BS changes as dose decreases.--   7. DM type 2 with gastroparesis: Brittle diabetic per mother--variable intake due to GI symptoms/gastroparesis with subsequent wide fluctuations. Resumed lantus insulin-10 units/HS--add am 5u dose. Now on 6u novolog units tid ac for meal coverage. Monitor and adjust as steroid tapers off.   -needs further adjustment 8. ChronicBLE wounds with chronically elevated ESR: continue doxycline for 30 days (started 02/02/14). Follow up with ID after discharge. Continue local wound care.   -hydrotherapy to elbow? 9. Constipation/diabetic gastroparesis/neurogenic bowel: Will resume reglan to help with constipation as well as GI symptoms . SSE today. Will check baseline KUB as no BM for a week. Will initiate bowel program.  10. Severe diabetic peripheral neuropathy: Continue neurontin 300 mg at bedtime. Will increase to 470m, the ankle pain is likely neurogenic    11. Pulmonary: poor secretion management, weak cough- discussed deep breathing, IS/FV, CPAP -respiratory therapist and mother working on clearance of secretions and coughing techniques  -keep hob at 30 degrees  -will ask RN to try chest percussion to assist with secretions - CXR with  atelectasis recently--IS/FV   LOS (Days) 4 A FACE TO FACE EVALUATION WAS PERFORMED  ZMeredith Staggers5/02/2014, 8:30 AM

## 2014-02-09 NOTE — Progress Notes (Signed)
Social Work  Social Work Assessment and Plan  Patient Details  Name: Jason [name] MRN: [id number] Date of Birth: [birth year]  Today's Date: 02/09/2014  Problem List:  Patient Active Problem List   Diagnosis Date Noted  . 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 Diabetic foot ulcer 02/04/2014  . Oral thrush 02/04/2014  . Leg weakness 01/30/2014  . Bilateral lower leg cellulitis 01/30/2014  . Altered mental status 01/30/2014  . 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 HCAP (healthcare-associated pneumonia) 01/29/2014  . DKA (diabetic ketoacidoses) 12/24/2013  . DM type 1 causing leg or foot ulcer 10/04/2013  . Rhinovirus infection 10/03/2013  . Protein-calorie malnutrition, severe 09/24/2013  . Aspiration pneumonia 09/23/2013  . Gastroparesis due to DM 06/02/2013  . Chronic low back pain 04/18/2013  . Painful diabetic neuropathy 03/20/2013  . Traumatic ecchymosis of toe 01/29/2013  . Tobacco abuse 01/29/2013  . Noncompliance with diet and medication regimen 01/29/2013  . VIRAL URI 09/15/2007  . GERD 09/15/2007  . Type 1 diabetes, uncontrolled, with neuropathy 05/29/2007  . ASTHMA 05/29/2007   Past Medical History:  Past Medical History  Diagnosis Date  . Diabetes mellitus   . GERD (gastroesophageal reflux disease)   . Asthma   . Hx MRSA infection     on face  . Gastroparesis   . Diabetic neuropathy   . Seizures    Past Surgical History:  Past Surgical History  Procedure Laterality Date  . Tonsillectomy     Social History:  reports that he has been smoking Cigars.  He has never used smokeless tobacco. He reports that he drinks alcohol. He reports that he does not use illicit drugs.  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  Mother very attentive (has been staying at hospital with pt).  Mother confirms good communicationa and relationship between herself and gf.  Social History Preferred language: English Religion: Baptist Cultural Background: NA Education: 11th grade Read: Yes Write: Yes Employment Status: Disabled (SSI app pending) Date Retired/Disabled/Unemployed: pt notes he has not been able to hold steady employement for several years due to his "brittle diabetes" Legal Hisotry/Current Legal Issues: none Guardian/Conservator: None - per MD, pt capable of making decisions on his own behalf   Abuse/Neglect Physical Abuse: Denies Verbal Abuse: Denies Sexual Abuse: Denies Exploitation of patient/patient's resources: Denies Self-Neglect: Denies  Emotional Status Pt's affect, behavior adn adjustment status: Pt lying in bed and reports fatigue from earlier tx sessions.  Initially allows mother to answer most questions for him, however, becomes more engaged as interview progressed.  Actually spoke openly about his emotional adjustment.  Pt denies any s/s of depression.  States, "I in for the fight".  Reports he is aware "this could take a long time (recovery) and I guess I'd like to hope that something special would happen and I'd be walking again.  But I know I gotta work hard."  Pt very open to [employer] with neuropsych for coping needs.  "  I never tought about talking to somebody about what I'm thinking about.  I guess that would be good." Recent Psychosocial Issues: None -  Pyschiatric History: None Substance Abuse History:  None  Patient / Family Perceptions, Expectations & Goals Pt/Family understanding of illness & functional limitations: pt and mother report that they understand MDs have now determined that he suffered a "stroke in my spinal cord".  Both report they would to understand better the root cause of the stroke.  Good understanding of current functional limitations and need for CIR Premorbid pt/family roles/activities: Independent overall PTA Anticipated changes in roles/activities/participation: pt will now require 24/7 assistance - family/ gf to assume caregiver roles and responsibilities.   Pt/family expectations/goals: "I just want to get as much back as I can."  [employer]: None Premorbid Home Care/DME Agencies: None Transportation available at discharge: yes Resource referrals recommended: Neuropsychology;Support group (specify);Advocacy groups  Discharge Planning Living Arrangements: Spouse/significant other;Children;Other (Comment) (lives with fiance for 6 years with her 3 children 10, 9 and ) Support Systems: Spouse/significant other;Parent;Other relatives Type of Residence: Private residence Insurance Resources: Medicaid (specify county) Sports coach) Financial Resources: SSI (pending) Financial Screen Referred: Previously completed Living Expenses: Education officer, community Management: Patient Does the patient have any problems obtaining your medications?: No (Medicaid pending approval per Development worker, community) Home Management: shared with girlfriend Patient/Family Preliminary Plans: Pt notes that they are not yet decided on which home he will d/c to.  Dependent on caregiver availabilities.  Both homes are one level and similarly accessible. Social Work Anticipated Follow Up Needs: HH/OP Expected length of stay: ELOS 4 weeks  Clinical Impression Pleasant young gentleman here following a spinal cord infarct.  Requiring total assistance and anticipate need for 24/7 assistance  upon d/c. Good family support with d/c location under discussion.  Family aware anticipated care needs.  Will follow for support and d/c planning needs.  Lennart Pall 02/09/2014, 10:39 AM

## 2014-02-10 ENCOUNTER — Inpatient Hospital Stay (HOSPITAL_COMMUNITY): Payer: Medicaid Other | Admitting: Physical Therapy

## 2014-02-10 ENCOUNTER — Inpatient Hospital Stay (HOSPITAL_COMMUNITY): Payer: Medicaid Other

## 2014-02-10 ENCOUNTER — Inpatient Hospital Stay (HOSPITAL_COMMUNITY): Payer: Self-pay

## 2014-02-10 ENCOUNTER — Inpatient Hospital Stay (HOSPITAL_COMMUNITY): Payer: Medicaid Other | Admitting: *Deleted

## 2014-02-10 DIAGNOSIS — G8250 Quadriplegia, unspecified: Secondary | ICD-10-CM

## 2014-02-10 DIAGNOSIS — L03119 Cellulitis of unspecified part of limb: Secondary | ICD-10-CM

## 2014-02-10 DIAGNOSIS — G9519 Other vascular myelopathies: Secondary | ICD-10-CM

## 2014-02-10 DIAGNOSIS — L02419 Cutaneous abscess of limb, unspecified: Secondary | ICD-10-CM

## 2014-02-10 DIAGNOSIS — K3184 Gastroparesis: Secondary | ICD-10-CM

## 2014-02-10 DIAGNOSIS — E1149 Type 2 diabetes mellitus with other diabetic neurological complication: Secondary | ICD-10-CM

## 2014-02-10 DIAGNOSIS — E291 Testicular hypofunction: Secondary | ICD-10-CM

## 2014-02-10 LAB — GLUCOSE, CAPILLARY
GLUCOSE-CAPILLARY: 244 mg/dL — AB (ref 70–99)
GLUCOSE-CAPILLARY: 146 mg/dL — AB (ref 70–99)
Glucose-Capillary: 338 mg/dL — ABNORMAL HIGH (ref 70–99)
Glucose-Capillary: 241 mg/dL — ABNORMAL HIGH (ref 70–99)

## 2014-02-10 LAB — B. BURGDORFI ANTIBODIES, CSF: Lyme Ab: NEGATIVE

## 2014-02-10 MED ORDER — INSULIN GLARGINE 100 UNIT/ML ~~LOC~~ SOLN
15.0000 [IU] | Freq: Every day | SUBCUTANEOUS | Status: DC
  Administered 2014-02-10 – 2014-02-11 (×2): 15 [IU] via SUBCUTANEOUS
  Filled 2014-02-10 (×3): qty 0.15

## 2014-02-10 MED ORDER — ALPRAZOLAM 0.5 MG PO TABS
0.5000 mg | ORAL_TABLET | Freq: Three times a day (TID) | ORAL | Status: DC | PRN
  Administered 2014-02-10 – 2014-03-03 (×22): 0.5 mg via ORAL
  Filled 2014-02-10 (×22): qty 1

## 2014-02-10 NOTE — Progress Notes (Signed)
Occupational Therapy Session Note  Patient Details  Name: Jason [name] MRN: [id number] Date of Birth: 1986/03/14  Today's Date: 02/10/2014 Time: 0900-0930 Time Calculation (min): 30 min  Short Term Goals: Week 1:  OT Short Term Goal 1 (Week 1): Pt will tolerate sitting on EOB for 10 min with mod A. OT Short Term Goal 2 (Week 1): Pt will use bath mit to wash face and chest. OT Short Term Goal 3 (Week 1): Pt will use a built up spoon to self feed with min A.  Skilled Therapeutic Interventions/Progress Updates:  Therapeutic activity with focus on pain management of left UE and upper back.  Patient received supine in bed with mother present.   Patient receptive for treatment but reported that he had already been assisted with bathing/dressing upper body.   OT provided setup for lower body ADL as RN attended to meds and dressing changes at bil LE.  Following RN care, patient reported worsening pain at left upper back and shoulder.   OT provided massage and myofascial release to upper back and shoulder while engaging patient in educational session on the benefit of active engagement in treatment and on defining goals and objects.   Patient tolerated treatment well and acknowledged his role in developing meaningful goals for treatment.     Therapy Documentation Precautions:  Precautions Precautions: Fall Precaution Comments: high risk for skin breakdown Required Braces or Orthoses: Other Brace/Splint Other Brace/Splint: Bilateral Prevalon boots Restrictions Weight Bearing Restrictions: No  General: General Amount of Missed OT Time (min): 30 Minutes  Pain: Pain Assessment Pain Assessment: 0-10 Pain Score: 7  Pain Type: Acute pain Pain Location: Thoracic Pain Orientation: Left;Upper Pain Descriptors / Indicators: Tightness Pain Frequency: Intermittent Pain Onset: Gradual Patients Stated Pain Goal: 0 Pain Intervention(s): Distraction;Elevated extremity;Repositioned;RN made  aware;Massage ADL: ADL ADL Comments: refer to FIM  See FIM for current functional status  Therapy/Group: Individual Therapy  Second session: Time: 2355-7322 Time Calculation (min):  45 min  Pain Assessment: 10/10 at buttocks during toileting/wound care, RN aware  Skilled Therapeutic Interventions: ADL-retraining with focus on assisted toileting (clothing management and hygiene), bed mobility, and slide board transfer using [employer].   Patient received supine in bed with family present and reporting awareness of BM in diaper.   Patient required total assist to remove pants but performed rolls, left and right, with manual facilitation to bend knees and trunk while patient used his arms to reach and grasp bed rails.   Patient required assist of RN to replace dressing and frequent breaks with slowed pace of movement to reduce discomfort.   Patient completed [facility] board transfer to w/c (+2) with difficulty and demo'd loss of sitting balance posterior while sitting at edge of bed in prep for transfer.  Patient left in w/c, tilted to comfort level, with call light within reach and family members present throughout session.  Patient advised on out-of-bed schedule, 1:00P-4:20P.  See FIM for current functional status  Therapy/Group: Individual Therapy  Third session: Time: 1430-1500 Time Calculation (min):  30 min  Pain Assessment: 8/10 at buttocks from prolonged sitting on gel cushion; repositioned/transferrred to raised mat  Skilled Therapeutic Interventions:  ADL-retraining with focus on modified [place]/slide board transfers (1 person), dynamic sitting balance, and pressure relief methods.   Patient reported severe pain and need for transfer back to bed but agreed to transfer to raised mat instead to learn alternate methods of pressure relief and transfer skills.  Patient required max assist and verbal cues  for technique but reported immediate relief of pain at buttocks with change in position  from sitting to supine on mat.   Patient performed supine UE exercises while on mat and returned to w/c after OT completed brief patient ed on w/c cushion replacement.  Patient was escorted back to room in w/c and was advised on need to remain in w/c as per out-of-bed schedule.  See FIM for current functional status  Therapy/Group: Individual Therapy  Salome Spotted 02/10/2014, 10:47 AM

## 2014-02-10 NOTE — Progress Notes (Signed)
Physical Therapy Note  Patient Details  Name: Jason [name] MRN: [id number] Date of Birth: 1986-06-23 Today's Date: 02/10/2014  Time: 1025-1125 60 minutes  1:1 Pt with c/o shoulder pain, declined heat, repositioned.  Bed mobility with pt self directing with max A, pt able to hook UE and use B UEs to assist.  Seated edge of bed requires max A. Pt with feelings of "light headedness" Pt spO2 88-90% on room air, HR 92-94%.  O2 applied at 2L O2 [place], spO2 improved to 96%, HR still low 90's.  beasy board transfer with total +2 assist, cuing for wt shifts and use of UEs.  Up in chair pt requires increased time for positioning for comfort and improved breathing, encouraged pursed lip breathing and stretching to chest mm.  Pt assisted to kitchen and participated in preparing microwave meal.  Pt requires min hand over hand assist to push buttons, able to self feed with built up fork.  Pt encouraged to sit up for remainder of the hour, pt agreeable.   [name] 02/10/2014, 11:28 AM

## 2014-02-10 NOTE — Progress Notes (Signed)
Subjective/Complaints: 27 y.o. male with h/o DM type 1, diabetic neuropathy with sensory deficits, as well as BLE diabetic ulcers x 4 month, h/o left elbow MRSA boil as well as gastroparesis who was admitted on 01/29/14 with lethargy, BLE weakness and inability to ambulate. He was found to have bilateral LE cellulitis, possible PNA, Acute on chronic renal injury, leucocytosis with WBC 32k as well as hyperglycemia. He was started on IV antibiotics as well as insulin drip. MRI/MRA brain of the brain with Chiari 1 malformation with cerebellar tonsils located 9 mm inferior to the foramen magnum and MRI cervical spine with hyperintense intramedullary cystic lesion extending from the C4 through T1-2 level neurology feels finding most consistent with spinal cord infarct  Feeling better. Clearing secretions more A 12 point review of systems has been performed and if not noted above is otherwise negative.   Objective: Vital Signs: Blood pressure 137/88, pulse 85, temperature 98.5 F (36.9 C), temperature source Oral, resp. rate 18, weight 66.4 kg (146 lb 6.2 oz), SpO2 99.00%. No results found. Results for orders placed during the hospital encounter of 02/05/14 (from the past 72 hour(s))  GLUCOSE, CAPILLARY     Status: Abnormal   Collection Time    02/07/14 11:39 AM      Result Value Ref Range   Glucose-Capillary 260 (*) 70 - 99 mg/dL   Comment 1 Notify RN    GLUCOSE, CAPILLARY     Status: Abnormal   Collection Time    02/07/14  4:47 PM      Result Value Ref Range   Glucose-Capillary 259 (*) 70 - 99 mg/dL   Comment 1 Documented in Chart    GLUCOSE, CAPILLARY     Status: Abnormal   Collection Time    02/07/14  8:21 PM      Result Value Ref Range   Glucose-Capillary 254 (*) 70 - 99 mg/dL   Comment 1 Notify RN    GLUCOSE, CAPILLARY     Status: Abnormal   Collection Time    02/08/14  7:21 AM      Result Value Ref Range   Glucose-Capillary 371 (*) 70 - 99 mg/dL  GLUCOSE, CAPILLARY     Status:  Abnormal   Collection Time    02/08/14 11:53 AM      Result Value Ref Range   Glucose-Capillary 238 (*) 70 - 99 mg/dL  GLUCOSE, CAPILLARY     Status: Abnormal   Collection Time    02/08/14  4:44 PM      Result Value Ref Range   Glucose-Capillary 268 (*) 70 - 99 mg/dL  GLUCOSE, CAPILLARY     Status: Abnormal   Collection Time    02/08/14  9:10 PM      Result Value Ref Range   Glucose-Capillary 161 (*) 70 - 99 mg/dL  GLUCOSE, CAPILLARY     Status: Abnormal   Collection Time    02/09/14  7:25 AM      Result Value Ref Range   Glucose-Capillary 238 (*) 70 - 99 mg/dL   Comment 1 Notify RN    GLUCOSE, CAPILLARY     Status: Abnormal   Collection Time    02/09/14 11:22 AM      Result Value Ref Range   Glucose-Capillary 209 (*) 70 - 99 mg/dL   Comment 1 Notify RN    GLUCOSE, CAPILLARY     Status: None   Collection Time    02/09/14  4:36 PM  Result Value Ref Range   Glucose-Capillary 86  70 - 99 mg/dL   Comment 1 Notify RN    GLUCOSE, CAPILLARY     Status: Abnormal   Collection Time    02/09/14  9:16 PM      Result Value Ref Range   Glucose-Capillary 136 (*) 70 - 99 mg/dL  GLUCOSE, CAPILLARY     Status: Abnormal   Collection Time    02/10/14  7:19 AM      Result Value Ref Range   Glucose-Capillary 338 (*) 70 - 99 mg/dL     HEENT: normal Cardio: RRR and no murmur Resp: CTA B/L and unlabored GI: BS positive and mildly distended and tympanitic, mild tenderness Extremity:  No Edema Skin:  Wounds on both ankles with eschar,debris, stage 2 on sacrum. Large elbow wound with eschar and fibronecrotic tissue Neuro: Alert/Oriented, Abnormal Sensory absent in feet and Abnormal Motor 0/5 in LE,0/5 finger extension and 0/5 hand intrinsic. Wrist extension 2-, tricep 2- to 2, biceps 5/5. Decreased sensation below elbows bilaterally Musc/Skel:  Normal Gen NAD   Assessment/Plan: 1. Functional deficits secondary to tetraparesis which require 3+ hours per day of interdisciplinary  therapy in a comprehensive inpatient rehab setting. Physiatrist is providing close team supervision and 24 hour management of active medical problems listed below. Physiatrist and rehab team continue to assess barriers to discharge/monitor patient progress toward functional and medical goals.   Provided education to patient and mother today regarding his injury,cause, prognosis.   FIM: FIM - Bathing Bathing: 0: Activity did not occur  FIM - Upper Body Dressing/Undressing Upper body dressing/undressing: 0: Wears gown/pajamas-no public clothing FIM - Lower Body Dressing/Undressing Lower body dressing/undressing: 0: Wears Oceanographer  FIM - Toileting Toileting: 0: Activity did not occur  FIM - Archivist Transfers: 0-Activity did not occur  FIM - Banker Devices: Sliding board Careers adviser) Bed/Chair Transfer: 1: Two helpers  FIM - Locomotion: Wheelchair Locomotion: Wheelchair: 1: Total Assistance/staff pushes wheelchair (Pt<25%) FIM - Locomotion: Ambulation Locomotion: Ambulation: 0: Activity did not occur  Comprehension Comprehension Mode: Auditory Comprehension: 5-Follows basic conversation/direction: With extra time/assistive device  Expression Expression Mode: Verbal Expression: 5-Expresses basic needs/ideas: With no assist  Social Interaction Social Interaction: 5-Interacts appropriately 90% of the time - Needs monitoring or encouragement for participation or interaction.  Problem Solving Problem Solving: 5-Solves basic 90% of the time/requires cueing < 10% of the time  Memory Memory: 6-More than reasonable amt of time  Medical Problem List and Plan:  1. 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 DVT Prophylaxis/Anticoagulation: Pharmaceutical: Lovenox  3. Chronic Pain Management: Continue MS contin 30 mg bid. Air bed  for back and buttock pain as well as pressure relief measures.  4. Mood: Patient with poor insight and awareness of deficits. He is highly anxious. Will monitor as activity increases. LCSW to follow for evaluation and support.    5. Neuropsych: This patient is capable of making decisions on his own behalf.  6. Spinal cord infarct: Initiate steroid taper and monitor for BS changes as dose decreases.--   7. DM type 2 with gastroparesis: Brittle diabetic per mother--variable intake due to GI symptoms/gastroparesis with subsequent wide fluctuations. Increase PM lantus to 15u--added am 5u dose. Now on 6u novolog units tid ac for meal coverage. Monitor and adjust as steroid tapers off.   -needs further adjustment 8. ChronicBLE wounds with chronically elevated ESR: continue  doxycline for 30 days (started 02/02/14). Follow up with ID after discharge. Continue local wound care.   -hydrotherapy to elbow? 9. Constipation/diabetic gastroparesis/neurogenic bowel: Will resume reglan to help with constipation as well as GI symptoms . SSE today. Will check baseline KUB as no BM for a week. Will initiate bowel program.  10. Severe diabetic peripheral neuropathy: Continue neurontin 300 mg at bedtime. Will increase to 471m, the ankle pain is likely neurogenic    11. Pulmonary: poor secretion management, weak cough- discussed deep breathing, IS/FV, CPAP -respiratory therapist and mother working on clearance of secretions and coughing techniques  -keep hob at 30 degrees  -will ask RN to try chest percussion to assist with secretions - CXR with atelectasis recently--IS/FV   LOS (Days) 5 A FACE TO FACE EVALUATION WAS PERFORMED  ZMeredith Staggers5/03/2014, 8:20 AM

## 2014-02-10 NOTE — Progress Notes (Signed)
Social Work Patient ID: Jason [name], male   DOB: January 08, 1986, 28 y.o.   MRN: [id number]   Met with pt, mother and girlfriend yesterday following team conference.  All aware of targeted goals of max assist with possible hoyer lift.  Agreeable with targeted LOS of 4 weeks.  Pt admits he is very fatigued from therapies but trying to stay up in w/c as long as possible.  Have requested that neuropsych follow for counseling/ support - pt agreeable.  Lennart Pall, LCSW

## 2014-02-10 NOTE — Patient Care Conference (Signed)
Inpatient RehabilitationTeam Conference and Plan of Care Update Date: 02/09/2014   Time: 2:05 PM    Patient Name: Jason [name]      Medical Record Number: [id number]  Date of Birth: 1986-01-25 Sex: Male         Room/Bed: 4W18C/4W18C-01 Payor Info: Payor: MEDICAID PENDING / Plan: MEDICAID PENDING / Product Type: *No Product type* /    Admitting Diagnosis: SPINAL CORD  Admit Date/Time:  02/05/2014  6:37 PM Admission Comments: No comment available   Primary Diagnosis:  <principal problem not specified> Principal Problem: <principal problem not specified>  Patient Active Problem List   Diagnosis Date Noted  . 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 Diabetic foot ulcer 02/04/2014  . Oral thrush 02/04/2014  . Leg weakness 01/30/2014  . Bilateral lower leg cellulitis 01/30/2014  . Altered mental status 01/30/2014  . 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 HCAP (healthcare-associated pneumonia) 01/29/2014  . DKA (diabetic ketoacidoses) 12/24/2013  . DM type 1 causing leg or foot ulcer 10/04/2013  . Rhinovirus infection 10/03/2013  . Protein-calorie malnutrition, severe 09/24/2013  . Aspiration pneumonia 09/23/2013  . Gastroparesis due to DM 06/02/2013  . Chronic low back pain 04/18/2013  . Painful diabetic neuropathy 03/20/2013  . Traumatic ecchymosis of toe 01/29/2013  . Tobacco abuse 01/29/2013  . Noncompliance with diet and medication regimen 01/29/2013  . VIRAL URI 09/15/2007  . GERD 09/15/2007  . Type 1 diabetes, uncontrolled, with neuropathy 05/29/2007  . ASTHMA 05/29/2007    Expected Discharge Date: Expected Discharge Date:  (4 weeks)  Team Members Present: Physician leading conference: Dr. Alger Simons Social Worker Present: Lennart Pall, LCSW Nurse Present: Elliot Cousin, RN PT Present: Canary Brim, PT;Other (comment) Carney Living, PT) OT Present: Chrys Racer, OT PPS Coordinator present : Daiva Nakayama, RN, CRRN;Becky Alwyn Ren, PT     Current Status/Progress Goal  Weekly Team Focus  Medical   Severe Diabetes type 1 with sequelae, sensory incomplete cord injury, pre-existing peripheral neuropathy, wound issues  improve sitting tolerance and transfers, improve activity tolerance  pain control, skin care, pulmonary care   Bowel/Bladder   Incontinent of bowel. Uses foley catheter,   LBM 02/07/2014  To be continent of bowel and bladder.  Bowel program continues.   Swallow/Nutrition/ Hydration             ADL's   Total Care (per Salome Spotted, OT)  Min A upper body dressing, Mod A bathinig, Mod A dynamic sitting balance,  (per Salome Spotted, OT)  Improved awareness and direction of care, pain managment, improved BUE function during ADL   (per Salome Spotted, OT)   Mobility   max-total A x 2  overall max A  transfers, bed mobility, sitting balance, activity tolerance   Communication             Safety/Cognition/ Behavioral Observations            Pain   c/o paim to neck, tailbone. PRN Oxycodone 5-10 mg given. Scheduled Morphine 30 mg.  Pain level 3 or less on a scale of 0-10  Monitor any onset of pain   Skin   Diabetic ulcer to bil. great toes, burned skin , discoloration to BLE, s/p ulcer to right elbow with foam dressing  No new skin breakdown/infection  Assess skin q shift    Rehab Goals Patient on target to meet rehab goals: Yes *See Care Plan and progress notes for long and short-term goals.  Barriers to Discharge: profound deficits, severe diabetes  Possible Resolutions to Barriers:  family ed, spinal cord education    Discharge Planning/Teaching Needs:  home with mother and gf to provide 24/7 assistance  Hypo/hyperglycemiia   Team Discussion:  Very nfortunate young man with poor long-term prognosis due to brittle DM.  Some improvement overall medically but needs continued, aggressive cough/ chest management.  Limited community resources available upon d/c.  Limited HH f/u available under [facility].  Likely to need a hoyer lift if returns  home with mother as primary caregiver.  May be able to do sliding board tfs with girlfriend.  Revisions to Treatment Plan:  None   Continued Need for Acute Rehabilitation Level of Care: The patient requires daily medical management by a physician with specialized training in physical medicine and rehabilitation for the following conditions: Daily direction of a multidisciplinary physical rehabilitation program to ensure safe treatment while eliciting the highest outcome that is of practical value to the patient.: Yes Daily medical management of patient stability for increased activity during participation in an intensive rehabilitation regime.: Yes Daily analysis of laboratory values and/or radiology reports with any subsequent need for medication adjustment of medical intervention for : Other;Neurological problems;Post surgical problems  Lennart Pall 02/10/2014, 1:26 PM

## 2014-02-11 ENCOUNTER — Inpatient Hospital Stay (HOSPITAL_COMMUNITY): Payer: Medicaid Other

## 2014-02-11 ENCOUNTER — Inpatient Hospital Stay (HOSPITAL_COMMUNITY): Payer: Medicaid Other | Admitting: Physical Therapy

## 2014-02-11 ENCOUNTER — Encounter (HOSPITAL_COMMUNITY): Payer: Self-pay

## 2014-02-11 ENCOUNTER — Inpatient Hospital Stay (HOSPITAL_COMMUNITY): Payer: Self-pay | Admitting: *Deleted

## 2014-02-11 LAB — GLUCOSE, CAPILLARY
GLUCOSE-CAPILLARY: 435 mg/dL — AB (ref 70–99)
Glucose-Capillary: 342 mg/dL — ABNORMAL HIGH (ref 70–99)
Glucose-Capillary: 237 mg/dL — ABNORMAL HIGH (ref 70–99)
Glucose-Capillary: 272 mg/dL — ABNORMAL HIGH (ref 70–99)

## 2014-02-11 MED ORDER — INSULIN ASPART 100 UNIT/ML ~~LOC~~ SOLN
3.0000 [IU] | Freq: Every day | SUBCUTANEOUS | Status: DC
  Administered 2014-02-11: 6 [IU] via SUBCUTANEOUS
  Administered 2014-02-12: 3 [IU] via SUBCUTANEOUS
  Administered 2014-02-13: 4 [IU] via SUBCUTANEOUS
  Administered 2014-02-15: 3 [IU] via SUBCUTANEOUS
  Administered 2014-02-16 – 2014-02-17 (×2): 6 [IU] via SUBCUTANEOUS
  Administered 2014-02-20: 4 [IU] via SUBCUTANEOUS

## 2014-02-11 NOTE — Progress Notes (Addendum)
Occupational Therapy Session Note  Patient Details  Name: Jason [name] MRN: [id number] Date of Birth: [birth year]  Today's Date: 02/11/2014 Time: 0830-0930 Time Calculation (min): 60 min  Short Term Goals: Week 1:  OT Short Term Goal 1 (Week 1): Pt will tolerate sitting on EOB for 10 min with mod A. OT Short Term Goal 2 (Week 1): Pt will use bath mit to wash face and chest. OT Short Term Goal 3 (Week 1): Pt will use a built up spoon to self feed with min A.  Skilled Therapeutic Interventions/Progress Updates: ADL-retraining with focus on improved participation in bathing/dressing supine and sitting supported in w/c.   Patient required total assist for lower body bathing and was prepared to begin bathing/dressing upper body at sink but required extensive assist during bed mobility and transfer d/t poor pain tolerance.   Patient exhibited anxiety and required multiple rest breaks with verbal redirection/remotivation necessary to proceed through tasks.   Patient left in w/c, tilted for comfort, with call light and phone placed within reach.     Therapy Documentation Precautions:  Precautions Precautions: Fall Precaution Comments: high risk for skin breakdown Required Braces or Orthoses: Other Brace/Splint Other Brace/Splint: Bilateral Prevalon boots Restrictions Weight Bearing Restrictions: No  Pain: 7/10 at low back/buttocks during transfer   ADL: ADL ADL Comments: refer to FIM  See FIM for current functional status  Therapy/Group: Individual Therapy  Second session: Time: 1415-1500 Time Calculation (min):  45 min  Pain Assessment:  3/10  Skilled Therapeutic Interventions: ADL-retraining with focus on improved bed mobility during assisted toilet hygiene and clothing management, slide board transfer training, and improved tolerance of static sitting using alternate tilt-in-space w/c.   Patient received supine in bed reporting need for assist with toilet hygiene.   Patient  required total assist to doff pants, remove diaper, and cleanse buttocks but was able to attempt pulling up his pants when placed within reach (mid-thigh level).   Patient fitted with temporary leg straps to assist with rolling left/right and with rising to sitting from supine.  Patient attempted use of straps during supine-sit but was unable to recruit sufficient trunk control to facilitate change position without assistance (pt=20% to rise from supine to sit).   Patient improved participation in slide board transfer, with good performance with anterior weight-shifting throughout transfer.   Patient received in new w/c and reported acceptable comfort with back support.   Patient left in w/c with call light within reach and family present.       See FIM for current functional status  Therapy/Group: Individual Therapy  Salome Spotted 02/11/2014, 9:33 AM

## 2014-02-11 NOTE — Progress Notes (Signed)
Physical Therapy Note  Patient Details  Name: Jason [name] MRN: [id number] Date of Birth: 05-Sep-1986 Today's Date: 02/11/2014  Time: 1025-1125 60 minutes  1:1 No c/o pain.  Pt with low BP per nursing, not to get OOB this treatment.  Introduced leg loops for increased independence, pt educated on use and practiced moving B LEs with cuing and min assist.  Circle sitting education and practice with max A to raise trunk, able to tolerate circle sitting 2 x 3 minutes with close supervision, pt able to doff velcro from boot with close supervision, increased time, pt sitting tolerance limited by shoulder and tailbone pain.  Discussed goals of therapy for sitting tolerance and increased independence, pt verbalizes understanding.  Provided pt with new tilt in space w/c to be trialed next session as appropriate.   Danae Orleans Wilmon Conover 02/11/2014, 11:25 AM

## 2014-02-11 NOTE — Progress Notes (Signed)
Subjective/Complaints: 28 y.o. male with h/o DM type 1, diabetic neuropathy with sensory deficits, as well as BLE diabetic ulcers x 4 month, h/o left elbow MRSA boil as well as gastroparesis who was admitted on 01/29/14 with lethargy, BLE weakness and inability to ambulate. He was found to have bilateral LE cellulitis, possible PNA, Acute on chronic renal injury, leucocytosis with WBC 32k as well as hyperglycemia. He was started on IV antibiotics as well as insulin drip. MRI/MRA brain of the brain with Chiari 1 malformation with cerebellar tonsils located 9 mm inferior to the foramen magnum and MRI cervical spine with hyperintense intramedullary cystic lesion extending from the C4 through T1-2 level neurology feels finding most consistent with spinal cord infarct  Upper back sore ("strain"). Had anxiety attack yesterday A 12 point review of systems has been performed and if not noted above is otherwise negative.   Objective: Vital Signs: Blood pressure 124/78, pulse 86, temperature 98.3 F (36.8 C), temperature source Oral, resp. rate 18, weight 66.5 kg (146 lb 9.7 oz), SpO2 99.00%. No results found. Results for orders placed during the hospital encounter of 02/05/14 (from the past 72 hour(s))  GLUCOSE, CAPILLARY     Status: Abnormal   Collection Time    02/08/14 11:53 AM      Result Value Ref Range   Glucose-Capillary 238 (*) 70 - 99 mg/dL  GLUCOSE, CAPILLARY     Status: Abnormal   Collection Time    02/08/14  4:44 PM      Result Value Ref Range   Glucose-Capillary 268 (*) 70 - 99 mg/dL  GLUCOSE, CAPILLARY     Status: Abnormal   Collection Time    02/08/14  9:10 PM      Result Value Ref Range   Glucose-Capillary 161 (*) 70 - 99 mg/dL  GLUCOSE, CAPILLARY     Status: Abnormal   Collection Time    02/09/14  7:25 AM      Result Value Ref Range   Glucose-Capillary 238 (*) 70 - 99 mg/dL   Comment 1 Notify RN    GLUCOSE, CAPILLARY     Status: Abnormal   Collection Time    02/09/14  11:22 AM      Result Value Ref Range   Glucose-Capillary 209 (*) 70 - 99 mg/dL   Comment 1 Notify RN    GLUCOSE, CAPILLARY     Status: None   Collection Time    02/09/14  4:36 PM      Result Value Ref Range   Glucose-Capillary 86  70 - 99 mg/dL   Comment 1 Notify RN    GLUCOSE, CAPILLARY     Status: Abnormal   Collection Time    02/09/14  9:16 PM      Result Value Ref Range   Glucose-Capillary 136 (*) 70 - 99 mg/dL  GLUCOSE, CAPILLARY     Status: Abnormal   Collection Time    02/10/14  7:19 AM      Result Value Ref Range   Glucose-Capillary 338 (*) 70 - 99 mg/dL  GLUCOSE, CAPILLARY     Status: Abnormal   Collection Time    02/10/14 11:44 AM      Result Value Ref Range   Glucose-Capillary 244 (*) 70 - 99 mg/dL  GLUCOSE, CAPILLARY     Status: Abnormal   Collection Time    02/10/14  4:49 PM      Result Value Ref Range   Glucose-Capillary 146 (*) 70 - 99  mg/dL   Comment 1 Notify RN    GLUCOSE, CAPILLARY     Status: Abnormal   Collection Time    02/10/14  9:12 PM      Result Value Ref Range   Glucose-Capillary 241 (*) 70 - 99 mg/dL  GLUCOSE, CAPILLARY     Status: Abnormal   Collection Time    02/11/14  7:24 AM      Result Value Ref Range   Glucose-Capillary 342 (*) 70 - 99 mg/dL   Comment 1 Notify RN       HEENT: normal Cardio: RRR and no murmur Resp: CTA B/L and unlabored GI: BS positive and mildly distended and tympanitic, mild tenderness Extremity:  No Edema Skin:  Wounds on both ankles with eschar,debris, stage 2 on sacrum. Large elbow wound with eschar and fibronecrotic tissue Neuro: Alert/Oriented, Abnormal Sensory absent in feet and Abnormal Motor 0/5 in LE,0/5 finger extension and 0/5 hand intrinsic. Wrist extension 2-, tricep 2- to 2, biceps 5/5. Decreased sensation below elbows bilaterally Musc/Skel:  Normal Gen NAD   Assessment/Plan: 1. Functional deficits secondary to tetraparesis which require 3+ hours per day of interdisciplinary therapy in a  comprehensive inpatient rehab setting. Physiatrist is providing close team supervision and 24 hour management of active medical problems listed below. Physiatrist and rehab team continue to assess barriers to discharge/monitor patient progress toward functional and medical goals.       FIM: FIM - Bathing Bathing: 0: Activity did not occur  FIM - Upper Body Dressing/Undressing Upper body dressing/undressing: 0: Wears gown/pajamas-no public clothing FIM - Lower Body Dressing/Undressing Lower body dressing/undressing: 0: Wears Interior and spatial designer  FIM - Toileting Toileting: 0: Activity did not occur  FIM - Air cabin crew Transfers: 0-Activity did not occur  FIM - Control and instrumentation engineer Devices: Sliding board Teacher, adult education) Bed/Chair Transfer: 1: Two helpers  FIM - Locomotion: Wheelchair Locomotion: Wheelchair: 1: Total Assistance/staff pushes wheelchair (Pt<25%) FIM - Locomotion: Ambulation Locomotion: Ambulation: 0: Activity did not occur  Comprehension Comprehension Mode: Auditory Comprehension: 5-Follows basic conversation/direction: With extra time/assistive device  Expression Expression Mode: Verbal Expression: 5-Expresses basic needs/ideas: With no assist  Social Interaction Social Interaction: 5-Interacts appropriately 90% of the time - Needs monitoring or encouragement for participation or interaction.  Problem Solving Problem Solving: 5-Solves basic 90% of the time/requires cueing < 10% of the time  Memory Memory: 6-More than reasonable amt of time  Medical Problem List and Plan:  1. 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 DVT Prophylaxis/Anticoagulation: Pharmaceutical: Lovenox  3. Chronic Pain Management: Continue MS contin 30 mg bid. Air bed for back and buttock pain as well as pressure relief measures.  4. Mood: reviewed anxiety with  patient. Discussed triggers and ways to de-escalate. Also have added prn xanax if he is unable to manage it.  -neuropsych assessment also requested 5. Neuropsych: This patient is capable of making decisions on his own behalf.  6. Spinal cord infarct: Initiate steroid taper and monitor for BS changes as dose decreases.--   7. DM type 2 with gastroparesis: Brittle diabetic per mother--variable intake due to GI symptoms/gastroparesis with subsequent wide fluctuations. Increase PM lantus to 15u--added am 5u dose. Now on 6u novolog units tid ac for meal coverage. Monitor and adjust as steroid tapers off.   -needs further adjustment 8. ChronicBLE wounds with chronically elevated ESR: continue doxycline for 30 days (started 02/02/14). Follow up with ID after discharge. Continue  local wound care.   -hydrotherapy to elbow?---d/w ID 9. Constipation/diabetic gastroparesis/neurogenic bowel: Will resume reglan to help with constipation as well as GI symptoms . Bowel program  10. Severe diabetic peripheral neuropathy: Continue neurontin 300 mg at bedtime. Will increase to 456m, the ankle pain is likely neurogenic    11. Pulmonary: poor secretion management, weak cough- discussed deep breathing, IS/FV, CPAP -respiratory therapist and mother working on clearance of secretions and coughing techniques  -keep hob at 30 degrees  -chest PT - CXR with atelectasis recently--IS/FV   LOS (Days) 6 A FACE TO FACE EVALUATION WAS PERFORMED  ZMeredith Staggers5/04/2014, 9:03 AM

## 2014-02-11 NOTE — Progress Notes (Signed)
Physical Therapy Note  Patient Details  Name: Jason [name] MRN: [id number] Date of Birth: 05/10/1986 Today's Date: 02/11/2014  Pt current flat back tilt in space w/c changed for new 18 x 18 tilt in space with Ulice Dash 3 back.  Secondary to lack of lateral supports on Jay 3 back, this therapist installed deep contour back to tilt in space w/c to allow for increased lateral support.  Secondary to lack of head rest on deep contour back pt and mother advised to back w/c up to a wall when dumping/tilting w/c back to allow pt head to rest on pillow against wall to prevent hyperextension and neck muscle fatigue.  Will transfer to w/c with OT in pm to assess placement of back.  As pt balance and trunk control improve pt may be able to progress to Garrett 3 back or manual w/c.    Malachy Mood 02/11/2014, 12:07 PM

## 2014-02-11 NOTE — Progress Notes (Signed)
Pt continues to refuse CPAP at this time. Understands that if he changes his mind he can call.

## 2014-02-11 NOTE — Progress Notes (Signed)
Recreational Therapy Assessment and Plan  Patient Details  Name: Jason [name] MRN: [id number] Date of Birth: Feb 20, 1986 Today's Date: 02/11/2014  Rehab Potential: Fair ELOS: ?2 weeks  Assessment Clinical Impression: Problem List:  Patient Active Problem List    Diagnosis  Date Noted   .  Spinal cord infarction  02/05/2014   .  Diabetic foot ulcer  02/04/2014   .  Oral thrush  02/04/2014   .  Leg weakness  01/30/2014   .  Bilateral lower leg cellulitis  01/30/2014   .  Altered mental status  01/30/2014   .  Transverse myelitis  01/30/2014   .  HCAP (healthcare-associated pneumonia)  01/29/2014   .  DKA (diabetic ketoacidoses)  12/24/2013   .  DM type 1 causing leg or foot ulcer  10/04/2013   .  Rhinovirus infection  10/03/2013   .  Protein-calorie malnutrition, severe  09/24/2013   .  Aspiration pneumonia  09/23/2013   .  Gastroparesis due to DM  06/02/2013   .  Chronic low back pain  04/18/2013   .  Painful diabetic neuropathy  03/20/2013   .  Traumatic ecchymosis of toe  01/29/2013   .  Tobacco abuse  01/29/2013   .  Noncompliance with diet and medication regimen  01/29/2013   .  VIRAL URI  09/15/2007   .  GERD  09/15/2007   .  Type 1 diabetes, uncontrolled, with neuropathy  05/29/2007   .  ASTHMA  05/29/2007    Past Medical History:  Past Medical History   Diagnosis  Date   .  Diabetes mellitus    .  GERD (gastroesophageal reflux disease)    .  Asthma    .  Hx MRSA infection      on face   .  Gastroparesis    .  Diabetic neuropathy    .  Seizures     Past Surgical History:  Past Surgical History   Procedure  Laterality  Date   .  Tonsillectomy      Assessment & Plan  Clinical Impression: Jason [name] is a 28 y.o. male with h/o DM type 1, diabetic neuropathy with sensory deficits, as well as BLE diabetic ulcers x 4 month, h/o left elbow MRSA boil as well as gastroparesis who was admitted on 01/29/14 with lethargy, BLE weakness and inability to ambulate.  He was found to have bilateral LE cellulitis, possible PNA, Acute on chronic renal injury, leucocytosis with WBC 32k as well as hyperglycemia. He was started on IV antibiotics as well as insulin drip. 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 Patient has worsening of MS with progressive BUE weakness on 01/20/14 due to SIRS/sepsis and treatment broadened with addition of fluconazole and acyclovir per ID input. LP with elevated glucose-132 and protein-111 and cultures negative so far. He was started on IV solumedrol with recommendation for repeat MRI cervical and thoracic spine pending.  Antibiotics de-escalated and Dr. Tommy Medal recommends 30 day course of doxycyline for chronic non-healing ulcers BLE with persistently elevated ESR as well as ABI for evaluation of BLE circulation. WOC consulted for input on BLE ulcers and feels likely vasculitic in nature--santyl added for chemical debridement. Neurology recommends completing 5 day course of high dose steroids for likely transverse  myelitis as well as PT/OT for likely protracted course of rehab. Follow up MRI of cervical and thoracic spine done yesterday revealing progressive abnormal spina cord signal now extending throughout upper and mid thoracic cord as far as T8 with mild patchy enhancement about C6 cord level question spinal cord infarct v/s transverse myelitis. Neurology feels that patient with symptoms c/w spinal cord infarct and recommends steroid taper and no other treatment indicated at this time. ABI BLE done yesterday revealing decrease flow anterior tibialis. Therapy ongoing and CIR recommended by rehab team. Patient transferred to CIR on 02/05/2014.   muscle paralysis, decreased oxygen support, abnormal tone and decreased  sitting balance and decreased postural control. Prior to hospitalization, patient was fully independent and driving, not working. Patient will benefit from skilled intervention to decrease level of assist with basic self-care skills prior to discharge home with care partner. Anticipate patient will require 24 hour supervision and total physical assist with mobility and follow up home health.  OT - End of Session   Pt presents with paralysis, decreased activity tolerance, decreased oxygen support, decreased functional mobility, decreased balance Limiting pt's independence with leisure/community pursuits.  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) Leisure Participation Style: With Family/Friends Awareness of Community Resources: Good-identify 3 post discharge leisure resources Psychosocial / Spiritual Patient agreeable to Pet Therapy: Yes Does patient have pets?: Yes Social interaction - Mood/Behavior: Cooperative Recreational Therapy Orientation Orientation -Reviewed with patient: Available activity resources Strengths/Weaknesses Patient Strengths/Abilities: Willingness to participate;Active premorbidly Patient weaknesses: Physical limitations  Plan Min 1time per week >20 minutes  Recommendations for other services: Neuropsych  Discharge Criteria: Patient will be discharged from TR if patient refuses treatment 3 consecutive times without medical reason.  If treatment goals not met, if there is a change in medical status, if patient makes no progress towards goals or if patient is discharged from hospital.  The above assessment, treatment plan, treatment alternatives and goals were discussed and mutually agreed upon: by patient  Waldon Reining 02/11/2014, 12:30 PM

## 2014-02-12 ENCOUNTER — Inpatient Hospital Stay (HOSPITAL_COMMUNITY): Payer: Medicaid Other

## 2014-02-12 ENCOUNTER — Inpatient Hospital Stay (HOSPITAL_COMMUNITY): Payer: Medicaid Other | Admitting: Physical Therapy

## 2014-02-12 LAB — GLUCOSE, CAPILLARY
GLUCOSE-CAPILLARY: 329 mg/dL — AB (ref 70–99)
GLUCOSE-CAPILLARY: 177 mg/dL — AB (ref 70–99)
Glucose-Capillary: 300 mg/dL — ABNORMAL HIGH (ref 70–99)
Glucose-Capillary: 167 mg/dL — ABNORMAL HIGH (ref 70–99)

## 2014-02-12 MED ORDER — INSULIN GLARGINE 100 UNIT/ML ~~LOC~~ SOLN
25.0000 [IU] | Freq: Every day | SUBCUTANEOUS | Status: DC
  Administered 2014-02-12 – 2014-02-16 (×5): 25 [IU] via SUBCUTANEOUS
  Filled 2014-02-12 (×7): qty 0.25

## 2014-02-12 MED ORDER — INSULIN GLARGINE 100 UNIT/ML ~~LOC~~ SOLN
10.0000 [IU] | Freq: Every day | SUBCUTANEOUS | Status: DC
  Administered 2014-02-13 – 2014-02-14 (×2): 10 [IU] via SUBCUTANEOUS
  Filled 2014-02-12 (×2): qty 0.1

## 2014-02-12 NOTE — Progress Notes (Signed)
Physical Therapy Note  Patient Details  Name: Jason [name] MRN: [id number] Date of Birth: 18-Nov-1985 Today's Date: 02/12/2014  Time: 1100-1200 60 minutes (co tx with PT)  1:1 No c/o pain at rest, pt c/o L shoulder pain when upright, relieved with pressure and reclining.  Pt noted to have tightness in scapular mm.Pt resting in bed; reporting improved BP today. BP assessed in supine: 121/86, 99% on RA. TED hose and leg loops donned. Performed bed mobility and rolling to L and R to don abdominal binder and check for incontinent BM with min A and pt guiding sequence 50%. With leg loops pt performed side > sit on EOB with pt performing 50-75% leg management and 25% for pushing to sitting. BP re-assessed seated: 89/57, HR:100. Pt denying symptoms of orthostasis. Performed slideboard transfer training bed > w/c <> mat with pt performing lateral leans and lifting LE with leg loop with mod-max A. Pt continues to require +2 for safety and total A for actual transfer across board secondary to pt becoming anxious and throwing his UE around the therapist and secondary to L shoulder pain. While on mat pt requested to recline on wedge to ease pain in L shoulder. Upon reclining pt reported an immediate cessation of pain. Pt noted to have multiple trigger points in L shoulder over scapula. Discussed use of TENS, Kpad and taping for pain management. Pt reports he has a TENS unit at home and would like to try that. Pt positioned in w/c with head supported in partial reclined position. Pt to rest and eat lunch in w/c.  Time 2: 1300-1330 30 minutes  1:1 Pt with continued c/o shoulder pain, RN made aware.  Continued work on rolling with use of leg loops, pt requires min A for LE management and cuing for each step, pt limited by anxiety and pain.  Attempted for pt to use home TENS unit to relieve shoulder pain.  Pt with neuropathy which limits sensation, pt able to feel "a little" but a strong sensation.  Attempted  multiple placements of electrodes, but pt with limited sensation throughout. PT advised pt to not use at this time and will work towards other methods of pain relief.    [name] 02/12/2014, 12:24 PM

## 2014-02-12 NOTE — Progress Notes (Addendum)
Subjective/Complaints:   Still some upper back/neck soreness--not using k pad though. Slouched in bed when i arrived A 12 point review of systems has been performed and if not noted above is otherwise negative.   Objective: Vital Signs: Blood pressure 138/93, pulse 72, temperature 98.5 F (36.9 C), temperature source Oral, resp. rate 18, weight 66.1 kg (145 lb 11.6 oz), SpO2 98.00%. No results found. Results for orders placed during the hospital encounter of 02/05/14 (from the past 72 hour(s))  GLUCOSE, CAPILLARY     Status: Abnormal   Collection Time    02/09/14 11:22 AM      Result Value Ref Range   Glucose-Capillary 209 (*) 70 - 99 mg/dL   Comment 1 Notify RN    GLUCOSE, CAPILLARY     Status: None   Collection Time    02/09/14  4:36 PM      Result Value Ref Range   Glucose-Capillary 86  70 - 99 mg/dL   Comment 1 Notify RN    GLUCOSE, CAPILLARY     Status: Abnormal   Collection Time    02/09/14  9:16 PM      Result Value Ref Range   Glucose-Capillary 136 (*) 70 - 99 mg/dL  GLUCOSE, CAPILLARY     Status: Abnormal   Collection Time    02/10/14  7:19 AM      Result Value Ref Range   Glucose-Capillary 338 (*) 70 - 99 mg/dL  GLUCOSE, CAPILLARY     Status: Abnormal   Collection Time    02/10/14 11:44 AM      Result Value Ref Range   Glucose-Capillary 244 (*) 70 - 99 mg/dL  GLUCOSE, CAPILLARY     Status: Abnormal   Collection Time    02/10/14  4:49 PM      Result Value Ref Range   Glucose-Capillary 146 (*) 70 - 99 mg/dL   Comment 1 Notify RN    GLUCOSE, CAPILLARY     Status: Abnormal   Collection Time    02/10/14  9:12 PM      Result Value Ref Range   Glucose-Capillary 241 (*) 70 - 99 mg/dL  GLUCOSE, CAPILLARY     Status: Abnormal   Collection Time    02/11/14  7:24 AM      Result Value Ref Range   Glucose-Capillary 342 (*) 70 - 99 mg/dL   Comment 1 Notify RN    GLUCOSE, CAPILLARY     Status: Abnormal   Collection Time    02/11/14 11:30 AM      Result Value  Ref Range   Glucose-Capillary 237 (*) 70 - 99 mg/dL   Comment 1 Notify RN    GLUCOSE, CAPILLARY     Status: Abnormal   Collection Time    02/11/14  4:40 PM      Result Value Ref Range   Glucose-Capillary 272 (*) 70 - 99 mg/dL  GLUCOSE, CAPILLARY     Status: Abnormal   Collection Time    02/11/14  9:29 PM      Result Value Ref Range   Glucose-Capillary 435 (*) 70 - 99 mg/dL  GLUCOSE, CAPILLARY     Status: Abnormal   Collection Time    02/12/14  7:23 AM      Result Value Ref Range   Glucose-Capillary 329 (*) 70 - 99 mg/dL   Comment 1 Notify RN       HEENT: normal Cardio: RRR and no murmur Resp: CTA B/L  and unlabored GI: BS positive and mildly distended and tympanitic, mild tenderness Extremity:  No Edema Skin:  Wounds on both ankles with eschar,debris, stage 2 on sacrum. Large elbow wound with eschar and fibronecrotic tissue Neuro: Alert/Oriented, Abnormal Sensory absent in feet and Abnormal Motor 0/5 in LE,0/5 finger extension and 0/5 hand intrinsic. Wrist extension 2-, tricep   2, biceps 5/5. Decreased sensation below elbows bilaterally Musc/Skel:  Normal Gen NAD   Assessment/Plan: 1. 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 Physiatrist is providing close team supervision and 24 hour management of active medical problems listed below. Physiatrist and rehab team continue to assess barriers to discharge/monitor patient progress toward functional and medical goals.       FIM: FIM - Bathing Bathing: 0: Activity did not occur  FIM - Upper Body Dressing/Undressing Upper body dressing/undressing: 0: Wears gown/pajamas-no public clothing FIM - Lower Body Dressing/Undressing Lower body dressing/undressing: 0: Wears Interior and spatial designer  FIM - Toileting Toileting: 0: Activity did not occur  FIM - Air cabin crew Transfers: 0-Activity did not  occur  FIM - Control and instrumentation engineer Devices: Sliding board Teacher, adult education) Bed/Chair Transfer: 1: Two helpers  FIM - Locomotion: Wheelchair Locomotion: Wheelchair: 1: Total Assistance/staff pushes wheelchair (Pt<25%) FIM - Locomotion: Ambulation Locomotion: Ambulation: 0: Activity did not occur  Comprehension Comprehension Mode: Auditory Comprehension: 5-Follows basic conversation/direction: With extra time/assistive device  Expression Expression Mode: Verbal Expression: 5-Expresses complex 90% of the time/cues < 10% of the time  Social Interaction Social Interaction: 5-Interacts appropriately 90% of the time - Needs monitoring or encouragement for participation or interaction.  Problem Solving Problem Solving: 5-Solves complex 90% of the time/cues < 10% of the time  Memory Memory: 6-More than reasonable amt of time  Medical Problem List and Plan:  1. 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 DVT Prophylaxis/Anticoagulation: Pharmaceutical: Lovenox  3. Chronic Pain Management: Continue MS contin 30 mg bid. Air bed for back and buttock pain as well as pressure relief measures.   -reminded him to use heating pad for shoulder/neck symptoms, good posture when in bed and chair 4. Mood: reviewed anxiety with patient. Discussed triggers and ways to de-escalate. Also have added prn xanax if he is unable to manage it.  -neuropsych assessment also requested 5. Neuropsych: This patient is capable of making decisions on his own behalf.  6. Spinal cord infarct: Initiate steroid taper and monitor for BS changes as dose decreases.--   7. DM type 2 with gastroparesis: Brittle diabetic per mother--variable intake due to GI symptoms/gastroparesis with subsequent wide fluctuations. Increase PM lantus to 25u--and am dose to 10u. Now on 6u novolog units tid ac for meal coverage. Monitor and adjust as  steroid tapers off.   -may need endocrine consult 8. ChronicBLE wounds with chronically elevated ESR: continue doxycline for 30 days (started 02/02/14). Follow up with ID after discharge. Continue local wound care.   -hydrotherapy to elbow?---d/w ID 9. Constipation/diabetic gastroparesis/neurogenic bowel: Will resume reglan to help with constipation as well as GI symptoms . Bowel program  10. Severe diabetic peripheral neuropathy: Continue neurontin  145m bid an 4047m qhs   11. Pulmonary: poor secretion management, weak cough- discussed deep breathing, IS/FV, CPAP -respiratory therapist and mother working on clearance of secretions and coughing techniques  -keep hob at 30 degrees  -chest PT - CXR with atelectasis recently--IS/FV  LOS (Days) 7 A FACE TO FACE EVALUATION WAS PERFORMED  Meredith Staggers 02/12/2014, 8:40 AM

## 2014-02-12 NOTE — Progress Notes (Signed)
Occupational Therapy Session Note  Patient Details  Name: Jason [name] MRN: [id number] Date of Birth: 1986/04/15  Today's Date: 02/12/2014 Time: 0900-1000 Time Calculation (min): 60 min  Short Term Goals: Week 1:  OT Short Term Goal 1 (Week 1): Pt will tolerate sitting on EOB for 10 min with mod A. OT Short Term Goal 2 (Week 1): Pt will use bath mit to wash face and chest. OT Short Term Goal 3 (Week 1): Pt will use a built up spoon to self feed with min A.  Skilled Therapeutic Interventions/Progress Updates:  ADL-retraining with focus on improved participation in bathing/dressing supine, improved static sitting balance, and transfers.   Patient received supine in bed.  Patient required total assist for lower body bathing although attempting to wash peri-area this session as instructed (pt = 10%) but reporting that he could neither feel nor see if he was cleaning himself.   Patient was assisted from supine to sitting at edge of bed in prep for attempted transfer using Denna Haggard lift.   Patient participated in transfer but lacked sufficient UE strength to pull himself up, with +2 assist.   Patient required max A +2 to sit in [place] but immediately complained of light-headedness and requested to return to bed.   Patient was assisted to sitting at edge of bed with HR/BP/O2 assessed at 95 bpm, 98/64, and 99%.   Patient was returned to supine and toward head of bed, max assist, in no distress but fatigued from trial use of Stedy.     Therapy Documentation Precautions:  Precautions Precautions: Fall Precaution Comments: high risk for skin breakdown Required Braces or Orthoses: Other Brace/Splint Other Brace/Splint: Bilateral Prevalon boots Restrictions Weight Bearing Restrictions: No  Pain: Pain Assessment Pain Score: 4   ADL: ADL ADL Comments: refer to FIM  See FIM for current functional status  Therapy/Group: Individual Therapy  Salome Spotted 02/12/2014, 10:48 AM

## 2014-02-12 NOTE — Progress Notes (Signed)
Occupational Therapy Session Note  Patient Details  Name: Jason [name] MRN: [id number] Date of Birth: [birth year]  Today's Date: 02/12/2014 Time: 1530-1630 Time Calculation (min): 60 min  Short Term Goals: Week 1:  OT Short Term Goal 1 (Week 1): Pt will tolerate sitting on EOB for 10 min with mod A. OT Short Term Goal 2 (Week 1): Pt will use bath mit to wash face and chest. OT Short Term Goal 3 (Week 1): Pt will use a built up spoon to self feed with min A. Week 2:     Skilled Therapeutic Interventions/Progress Updates:    Pt lying in bed upon OT arrival.  Mom present. BP= 97/64 supine;  85/57 and 93/60 sitting in wc Addressed bed mobility, sitting balance EOB and EOM, transfers with transfer board.  Pt. Indicated he thought he may have had a BM.  Checked but none seen.  Went from supine to sit with total assist.  Sat EOB for 5 mins.   Transferred from bed to wc with total assist using sliding board.   Transferred to mat with max assist going down hill.  Practiced sitting on EOB for 15 minutes with assist ranging from total to max assist.    Pt able to lean to right elbow and hold balance for 40 seconds before tiring.  Unable to come from side sitting to upright with out total assist. Transferred back to wc with total assist.  Pt stayed in wc at end of session with call bell in place and Mom in room.      Therapy Documentation Precautions:  Precautions Precautions: Fall Precaution Comments: high risk for skin breakdown Required Braces or Orthoses: Other Brace/Splint Other Brace/Splint: Bilateral Prevalon boots Restrictions Weight Bearing Restrictions: No General:   SpO2: 96 % O2 Device: None (Room air) Pain:  8/10 left shoulder around lateral scapula  and back   ADL: ADL ADL Comments: refer to FIM Exercises:    See FIM for current functional status  Therapy/Group: Individual Therapy  Lisa Roca 02/12/2014, 5:24 PM

## 2014-02-12 NOTE — Progress Notes (Signed)
Physical Therapy Session Note  Patient Details  Name: Jason [name] MRN: [id number] Date of Birth: Jan 20, 1986  Today's Date: 02/12/2014 Time: 1100 (60 min cotreat with KD)-1130 Time Calculation (min): 30 min  Short Term Goals: Week 1:  PT Short Term Goal 1 (Week 1): Patient will perfom Bed Mobility Max-assist x 2 PT Short Term Goal 2 (Week 1): Patient will perform Transfers Max-assist x 2 PT Short Term Goal 3 (Week 1): Dietitian with Verbal Understanding PT Short Term Goal 4 (Week 1): Static Sitting Balance EOB with Max-assist  Skilled Therapeutic Interventions/Progress Updates:   Pt resting in bed; reporting improved BP today.  BP assessed in supine: 121/86, 99% on RA.  TED hose and leg loops donned.  Performed bed mobility and rolling to L and R to don abdominal binder and check for incontinent BM with min A and pt guiding sequence 50%.  With leg loops pt performed side > sit on EOB with pt performing 50-75% leg management and 25% for pushing to sitting.  BP re-assessed seated: 89/57, HR:100.  Pt denying symptoms of orthostasis. Performed slideboard transfer training bed > w/c <> mat with pt performing lateral leans and lifting LE with leg loop with mod-max A.  Pt continues to require +2 for safety and total A for actual transfer across board secondary to pt becoming anxious and throwing his UE around the therapist and secondary to L shoulder pain. While on mat pt requested to recline on wedge to ease pain in L shoulder.  Upon reclining pt reported an immediate cessation of pain.  Pt noted to have multiple trigger points in L shoulder over scapula.  Discussed use of TENS, Kpad and taping for pain management.  Pt reports he has a TENS unit at home and would like to try that.  Pt positioned in w/c with head supported in partial reclined position.  Pt to rest and eat lunch in w/c.  Therapy Documentation Precautions:  Precautions Precautions: Fall Precaution Comments:  high risk for skin breakdown Required Braces or Orthoses: Other Brace/Splint Other Brace/Splint: Bilateral Prevalon boots Restrictions Weight Bearing Restrictions: No Pain: Pain Assessment Pain Assessment: Faces Pain Score: 4  Faces Pain Scale: Hurts even more Pain Type: Acute pain Pain Location: Shoulder Pain Orientation: Left;Posterior Pain Descriptors / Indicators: Discomfort;Sore Pain Onset: Other (Comment) (sitting upright) Pain Intervention(s): Repositioned;Heat applied;TENS  See FIM for current functional status  Therapy/Group: Individual Therapy and Co-Treatment  Malachy Mood 02/12/2014, 12:42 PM

## 2014-02-12 NOTE — Consult Note (Addendum)
WOC wound consult note Reason for Consult: Consult requested for right elbow wound.  Pt states he has repeatedly dragged it against the bed when he is pulling himself up. Wound type: Full thickness; pt is unsure of initial etiology of wound. Measurement: 6X3X.2cm Wound bed:60% red and moist, 40% yellow slough which is tightly adhered.  Edges bleed easily when cleaned.   Drainage (amount, consistency, odor) Mod amt tan drainage Periwound: Intact skin surrounding Dressing procedure/placement/frequency: Santyl ointment to chemically debride nonviable tissue.  Foam dressing to absorb drainage and protect from further friction. Elbow joint is a difficult location to promote healing R/T constant movement over wound bed; this location might be very slow to heal. Please re-consult if further assistance is needed.  Thank-you,  Julien Girt MSN, [place], [place], [place], [place]

## 2014-02-13 ENCOUNTER — Inpatient Hospital Stay (HOSPITAL_COMMUNITY): Payer: Medicaid Other | Admitting: Physical Therapy

## 2014-02-13 ENCOUNTER — Inpatient Hospital Stay (HOSPITAL_COMMUNITY): Payer: Medicaid Other

## 2014-02-13 DIAGNOSIS — G9519 Other vascular myelopathies: Secondary | ICD-10-CM

## 2014-02-13 LAB — GLUCOSE, CAPILLARY
GLUCOSE-CAPILLARY: 96 mg/dL (ref 70–99)
GLUCOSE-CAPILLARY: 278 mg/dL — AB (ref 70–99)
Glucose-Capillary: 174 mg/dL — ABNORMAL HIGH (ref 70–99)
Glucose-Capillary: 46 mg/dL — ABNORMAL LOW (ref 70–99)
Glucose-Capillary: 103 mg/dL — ABNORMAL HIGH (ref 70–99)

## 2014-02-13 NOTE — Progress Notes (Signed)
   Subjective/Complaints:   Only complains of left shoulder and neck pain No other complaints Mom in room   Objective: Vital Signs: Blood pressure 107/68, pulse 92, temperature 97.9 F (36.6 C), temperature source Oral, resp. rate 17, weight 135 lb 11.2 oz (61.553 kg), SpO2 100.00%.  Thin young male NAD Chest- CTA CV- REG rate abd- thin, soft Ext, no edema MSK- has FROM left shoulder with passive movement Active movement- reproduces pain- no rash over sholder  Assessment/Plan: 1. Functional deficits secondary to tetraparesis which   Medical Problem List and Plan:  1. 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 DVT Prophylaxis/Anticoagulation: Pharmaceutical: Lovenox  3. Chronic Pain Management: Continue MS contin 30 mg bid. Has heating pad for shoulder Will check shoulder xray 4. Mood: reviewed anxiety with patient.  prn xanax if he is unable to manage it.  -neuropsych assessment also requested 5. Neuropsych: This patient is capable of making decisions on his own behalf.  6. Spinal cord infarct: Initiate steroid taper and monitor for BS changes as dose decreases.--   CBG (last 3)   Recent Labs  02/12/14 1626 02/12/14 2000 02/13/14 0731  GLUCAP 167* 177* 174*   7. 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 Increase PM lantus to 25u--and am dose to 10u. Now on 6u novolog units tid ac for meal coverage. Monitor and adjust as steroid tapers off.   -may need endocrine consult 8. ChronicBLE wounds with chronically elevated ESR: continue doxycline for 30 days (started 02/02/14). Follow up with ID after discharge. Continue local wound care.   -hydrotherapy to elbow?---d/w ID 9. Constipation/diabetic gastroparesis/neurogenic bowel: 10. Severe diabetic peripheral neuropathy: Continue neurontin  $RemoveBe'100mg'wtdfwDktE$  bid an $Remo'400mg'DJKTF$    qhs   11. Pulmonary: poor secretion management, weak cough- discussed deep breathing, IS/FV, CPAP -respiratory therapist and mother working on clearance of secretions and coughing techniques  -keep hob at 30 degrees  -chest PT - CXR with atelectasis recently--IS/FV   LOS (Days) 8 A FACE TO FACE EVALUATION WAS PERFORMED  Xayne Brumbaugh H Orva Riles 02/13/2014, 9:13 AM

## 2014-02-13 NOTE — Progress Notes (Signed)
Patient refused CPAP tonight. There Isn't a machine in the room at this time. RN aware. Explained to Patient that if they changed their mind, to just have the RN call Respiratory and we would come set them up. 

## 2014-02-13 NOTE — Progress Notes (Signed)
Physical Therapy Session Note  Patient Details  Name: Jason [name] MRN: [id number] Date of Birth: [birth year]  Today's Date: 02/13/2014 Time: 1030-1100 Time Calculation (min): 30 min  Short Term Goals: Week 1:  PT Short Term Goal 1 (Week 1): Patient will perfom Bed Mobility Max-assist x 2 PT Short Term Goal 2 (Week 1): Patient will perform Transfers Max-assist x 2 PT Short Term Goal 3 (Week 1): Dietitian with Verbal Understanding PT Short Term Goal 4 (Week 1): Static Sitting Balance EOB with Max-assist   Therapy Documentation Precautions:  Precautions Precautions: Fall Precaution Comments: high risk for skin breakdown Required Braces or Orthoses: Other Brace/Splint Other Brace/Splint: Bilateral Prevalon boots Restrictions Weight Bearing Restrictions: No Pain: Pain Assessment Pain Score: 2   Therapeutic Exercise:(15') ROM stretching B LE's for gastrocs, hamstrings and into knee/hip flexion Therapeutic Activity:(15') transfer sequence training review with patient and mother and discussion re; possibility of C5,6 radiculopathy (?+Bakody's Sign) with L UE elevated and forearm resting on top of head with reduction in L UE pain. Patient is to get xray of c-spine this afternoon. Resume transfer training as indicated.   Therapy/Group: Individual Therapy  Clearence Ped 02/13/2014, 11:00 AM

## 2014-02-13 NOTE — Progress Notes (Signed)
Hypoglycemic Event  CBG:46  Treatment: 15 GM carbohydrate snack  Symptoms: Hungry  Follow-up CBG: Time:1642 CBG Result:103  Possible Reasons for Event: Inadequate meal intake  Comments/MD notified:yes    Jason [name] [name]  Remember to initiate Hypoglycemia Order Set & complete

## 2014-02-14 ENCOUNTER — Inpatient Hospital Stay (HOSPITAL_COMMUNITY): Payer: Medicaid Other | Admitting: *Deleted

## 2014-02-14 LAB — GLUCOSE, CAPILLARY
GLUCOSE-CAPILLARY: 68 mg/dL — AB (ref 70–99)
GLUCOSE-CAPILLARY: 329 mg/dL — AB (ref 70–99)
Glucose-Capillary: 181 mg/dL — ABNORMAL HIGH (ref 70–99)
Glucose-Capillary: 63 mg/dL — ABNORMAL LOW (ref 70–99)
Glucose-Capillary: 65 mg/dL — ABNORMAL LOW (ref 70–99)
Glucose-Capillary: 191 mg/dL — ABNORMAL HIGH (ref 70–99)
Glucose-Capillary: 232 mg/dL — ABNORMAL HIGH (ref 70–99)
Glucose-Capillary: 207 mg/dL — ABNORMAL HIGH (ref 70–99)

## 2014-02-14 MED ORDER — INSULIN ASPART 100 UNIT/ML ~~LOC~~ SOLN: 4.0000 [IU] | Freq: Once | SUBCUTANEOUS | Status: DC

## 2014-02-14 MED ORDER — INSULIN GLARGINE 100 UNIT/ML ~~LOC~~ SOLN
6.0000 [IU] | Freq: Every day | SUBCUTANEOUS | Status: DC
  Administered 2014-02-15 – 2014-02-17 (×3): 6 [IU] via SUBCUTANEOUS
  Filled 2014-02-14 (×5): qty 0.06

## 2014-02-14 MED ORDER — INSULIN ASPART 100 UNIT/ML ~~LOC~~ SOLN
3.0000 [IU] | Freq: Three times a day (TID) | SUBCUTANEOUS | Status: DC
  Administered 2014-02-14 – 2014-02-23 (×20): 3 [IU] via SUBCUTANEOUS

## 2014-02-14 NOTE — Plan of Care (Signed)
Problem: RH PAIN MANAGEMENT Goal: RH STG PAIN MANAGED AT OR BELOW PT'S PAIN GOAL Pain level <5 on a scale of 0-10.  Outcome: Not Progressing Continues to complain of L neck/ shoulder pain

## 2014-02-14 NOTE — Progress Notes (Signed)
Subjective/Complaints:   Still with some left shoulder pain. This only happens with activity.  Objective: Vital Signs: Blood pressure 104/70, pulse 91, temperature 98.7 F (37.1 C), temperature source Oral, resp. rate 18, weight 136 lb 1.6 oz (61.735 kg), SpO2 100.00%.  Thin male in no acute distress. Chest clear to auscultation. Cardiac exam S1-S2 are regular. Abdominal exam thin, and bowel sounds, soft. Extremities no edema.  Assessment/Plan: 1. Functional deficits secondary to tetraparesis which   Medical Problem List and Plan:  1. 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 DVT Prophylaxis/Anticoagulation: Pharmaceutical: Lovenox  3. Chronic Pain Management: Continue MS contin 30 mg bid. 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 I doubt that this is causing his discomfort. I suspect his discomfort is related to increased physical activity. 4. Mood: Stable at this time. 5. Neuropsych: This patient is capable of making decisions on his own behalf.  6. Spinal cord infarct: Initiate steroid taper and monitor for BS changes as dose decreases.--    7. 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 Increase PM lantus to 25u--and am dose to 10u. Now on 6u novolog units tid ac for meal coverage. Monitor and adjust as steroid tapers off.   -may need endocrine consult  Recent Labs  02/13/14 1642 02/13/14 2219 02/14/14 0658  GLUCAP 103* 278* 181*   8. ChronicBLE wounds with chronically elevated ESR: continue doxycline for 30 days (started 02/02/14). Follow up with ID after discharge. Continue local wound care.  9. Constipation/diabetic gastroparesis/neurogenic bowel: 10. Severe diabetic peripheral neuropathy: Continue neurontin  100mg bid an  400mg  qhs   11. Pulmonary: poor secretion management, weak cough- discussed deep breathing, IS/FV, CPAP -respiratory therapist and mother working on clearance of secretions and coughing techniques  -keep hob at 30 degrees  -chest PT - CXR with atelectasis recently--IS/FV   LOS (Days) 9 A FACE TO FACE EVALUATION WAS PERFORMED   H  02/14/2014, 9:06 AM     

## 2014-02-14 NOTE — Progress Notes (Signed)
Physical Therapy Session Note  Patient Details  Name: Jason [name] MRN: [id number] Date of Birth: February 02, 1986  Today's Date: 02/14/2014 Time: 1435-1530 Time Calculation (min): 55 min   Skilled Therapeutic Interventions/Progress Updates:  Patient in bed in supine, complains of pain in L subscapular/trapezius region, with numbness towards his fingers,nursing notified, patient can not receive any more pain medicine at this time. Pain appears to be more radicular in character vs shoulder pain. Gentle massage and myofacial release applied to the region, patient stated that is has helped him. PNF patterns to B LE.  Supine to sit with max A , sitting EOB 15 min with mod to max A, due to pain patient bears weight mainly through R side.  Transfer to w/c using sliding board -max A .  In w/c therapeutic exercises to for B UE with manual resistance.  Patient left in w/c,slghtly tilted back , asked for soft cervical collar to be applied. Mother in the room.,all needs withi reach and nursing notified.     Therapy Documentation Precautions:  Precautions Precautions: Fall Precaution Comments: high risk for skin breakdown Required Braces or Orthoses: Other Brace/Splint Other Brace/Splint: Bilateral Prevalon boots Restrictions Weight Bearing Restrictions: No Pain: Pain Assessment Pain Score: 4   See FIM for current functional status  Therapy/Group: Individual Therapy  [name] 02/14/2014, 3:46 PM

## 2014-02-14 NOTE — Progress Notes (Signed)
Hypoglycemic Event  CBG: 63  Treatment: 15 GM carbohydrate snack  Symptoms: None  Follow-up CBG: Time:1313 CBG Result:329  Possible Reasons for Event: Unknown  Comments/MD notified:yes/orders received  Jason [name] Jason [name]  Remember to initiate Hypoglycemia Order Set & complete

## 2014-02-14 NOTE — Progress Notes (Signed)
Hypoglycemic Event  CBG:68 Treatment: 15 GM carbohydrate snack  Symptoms: None  Follow-up CBG: Time1219 BG Result 63 Possible Reasons for Event: Unknown  Comments/MD notified:yes    Ryelynn Guedea Martir Estevon Fluke  Remember to initiate Hypoglycemia Order Set & complete

## 2014-02-15 ENCOUNTER — Inpatient Hospital Stay (HOSPITAL_COMMUNITY): Payer: Self-pay | Admitting: Physical Therapy

## 2014-02-15 ENCOUNTER — Ambulatory Visit: Payer: Self-pay | Admitting: Internal Medicine

## 2014-02-15 ENCOUNTER — Inpatient Hospital Stay (HOSPITAL_COMMUNITY): Payer: Medicaid Other | Admitting: Physical Therapy

## 2014-02-15 ENCOUNTER — Inpatient Hospital Stay (HOSPITAL_COMMUNITY): Payer: Self-pay | Admitting: Occupational Therapy

## 2014-02-15 ENCOUNTER — Inpatient Hospital Stay (HOSPITAL_COMMUNITY): Payer: Medicaid Other | Admitting: Occupational Therapy

## 2014-02-15 LAB — GLUCOSE, CAPILLARY
GLUCOSE-CAPILLARY: 101 mg/dL — AB (ref 70–99)
GLUCOSE-CAPILLARY: 130 mg/dL — AB (ref 70–99)
Glucose-Capillary: 168 mg/dL — ABNORMAL HIGH (ref 70–99)
Glucose-Capillary: 71 mg/dL (ref 70–99)

## 2014-02-15 NOTE — Progress Notes (Signed)
Occupational Therapy Weekly Progress Note And Treatment Session Notes  Patient Details  Name: Jason [name] MRN: [id number] Date of Birth: Mar 03, 1986  Beginning of progress report period: Feb 07, 2014 End of progress report period: Feb 15, 2014  Today's Date: 02/15/2014 Time: 0800-0900 and 245-340 Time Calculation (min): 60 min and 55 min  Patient has met 2 of 3 short term goals.  Patient is limited by left shoulder and neck pain, sore bottom, episodes of OH, significant issues with blood sugar regulation and nausea.  Patient continues to demonstrate the following deficits: muscle paralysis, decreased oxygen support, abnormal tone, pain and decreased sitting balance and decreased postural control and therefore will continue to benefit from skilled OT intervention to enhance overall performance with BADL and Reduce care partner burden.  Patient progressing toward long term goals.  Plan of care revisions: See POC for details, LB dressing goal added, and 2 LTGs upgraded.  OT Short Term Goals Week 1:  OT Short Term Goal 1 (Week 1): Pt will tolerate sitting on EOB for 10 min with mod A. OT Short Term Goal 1 - Progress (Week 1): Progressing toward goal OT Short Term Goal 2 (Week 1): Pt will use bath mit to wash face and chest. OT Short Term Goal 2 - Progress (Week 1): Met OT Short Term Goal 3 (Week 1): Pt will use a built up spoon to self feed with min A. OT Short Term Goal 3 - Progress (Week 1): Met Week 2:  OT Short Term Goal 1 (Week 2): Sitting Balance:  Max assist EOB for 10 min OT Short Term Goal 2 (Week 2): UB Dressing:  Patient will pull shirt down in the back with min assist for stability OT Short Term Goal 3 (Week 2): LB dressing:  Patient will donn pants over feet in long/circle sitting with min assist OT Short Term Goal 4 (Week 2): Grooming: patient will brush teeth sitting in w/c at sink with min assist for set up PRN OT Short Term Goal 5 (Week 2): Communication: Patient will  demonstrate use of hospital phone with supervision  Skilled Therapeutic Interventions/Progress Updates:  1)  Patient sleeping in bed upon arrival and patient's mother sleeping in cot.  Patient had not eaten breakfast therefore first half of the session consisted of patient feeding self after set up.  Patient able to feed self finger foods such as grapes, deviled eggs, and bacon however due to the size of the cereal bowl, patient mother fed him cereal.  Encouraged patient's mother to bring in bowl from home that she feels will be best to encourage self feeding.  Issued universal cuff to improve independence with managing utensils secondary patient reports the built hp handle on utensils does not always work.  Remainder of session included UB bath and dress as well as review of OT goals that were set on evaluation.  Also removed "Out of Bed Chart" that patient is in charge of making sure is filled out by staff and/or family secondary to-if it is not written down it did not happen.  Reviewed with patient and mother that when doctor/team ask how long he is spending in the chair-we cannot provide an answer.  They report that they will be more responsible with this.  Hand off to PT.  2)  Patient sleeping in bed upon arrival for pm session stating that he was about to eat lunch (2:45pm).  Patient reports attempted to eat lunch earlier however was experiencing nausea during/after am therapy  so asked to go back to bed and after medication provided-he fell asleep.  Patient unable to identify why he felt nauseated earlier and in-depth discussion occurred regarding need to increase ability to tolerate out of bed and in an upright position.  Handout was provided last week related to this topic therefore the handout was retrieved from patient's education notebook and patient read the 2 page information handout and patient & mother verbalized understanding.   Patient proceeded to ask numerous questions related to getting in  his "Burke Keels" stating that he did not think the height of the seat would be a problem for side board transfers.  [name] wants to take their Lucianne Lei to be converted to allow ease of use-patient somewhat reluctant.  Patient to have [employer] seat height measured for PT to further address options with patient and family.  Patient wanting to be able to use the hospital phone when the call comes to order his meals.  Therefore, patient practiced handling the phone once the headset is handed to him and holding the headset to his ear.  Patient managed well after several practices.  Patient also able to dial his mother's cell phone after practice. Will continue to address hospital phone use to include pick up phone when it rings and hanging up the phone.  Encouraged patient to begin experimenting with his cell phone use as well.  He states that he does not want to break it so family to bring in his HD phone case so he can begin practicing using his cell phone.  Therapy Documentation Precautions:  Precautions Precautions: Fall Precaution Comments: high risk for skin breakdown Required Braces or Orthoses: Other Brace/Splint Other Brace/Splint: Bilateral Prevalon boots Restrictions Weight Bearing Restrictions: No Pain: 1)  Shoulder and neck pain, not rated, rest, repositioned 2)  No reports of pain ADL: See FIM for current functional status  Therapy/Group: Individual Therapy both sessions  Gaye Pollack 02/15/2014, 5:13 PM

## 2014-02-15 NOTE — Progress Notes (Signed)
Physical Therapy Note  Patient Details  Name: Jason [name] MRN: [id number] Date of Birth: September 03, 1986 Today's Date: 02/15/2014  Time: [phone] 60 minutes (co-tx with PT)  1:1 Pt continues with c/o L shoulder/scapular pain with mobility, eases with repositioning, hot pad applied after session.  Treatment continues to focus on increasing pt independence with bed mobility and sliding board transfers.  Pt able to use leg loops with cuing to assist with rolling in bed.  Pt performed rolling multiple attempts with cues for sequencing, use of bedrails and leg loops.  Supine to sit with mod A, encouraged pt to use bed controls to his advantage.  Sliding board transfers with pt assisting much more with UEs, able to perform with +1 max A (+2 for safety and holding board).  Seated balance on mat with reaching for theraball,  Pt requires mod-max A for balance, limited by shoulder and tailbone pain, requiring frequent rests.  Pt encouraged to continue to sit up in chair at end of session, hot pad applied.   Danae Orleans Donawerth 02/15/2014, 10:04 AM

## 2014-02-15 NOTE — Progress Notes (Signed)
Physical Therapy Session Note  Patient Details  Name: Jason [name] MRN: [id number] Date of Birth: Jun 25, 1986  Today's Date: 02/15/2014 Time: 0900 (60 min co-treat with KD)-0930 Time Calculation (min): 30 min  Short Term Goals: Week 1:  PT Short Term Goal 1 (Week 1): Patient will perfom Bed Mobility Max-assist x 2 PT Short Term Goal 2 (Week 1): Patient will perform Transfers Max-assist x 2 PT Short Term Goal 3 (Week 1): Dietitian with Verbal Understanding PT Short Term Goal 4 (Week 1): Static Sitting Balance EOB with Max-assist  Skilled Therapeutic Interventions/Progress Updates:   Co-treat with PT to continue to address bed mobility independence and transfer training.  Pt performed bed mobility with use of leg loops and min-mod A to don pants and place abdominal binder.  Performed supine > sit EOB with use of bed controls, leg loops and +2 A with pt performing >50%.  Performed transfers bed > w/c <> mat with use of slideboard with +2 for safety but pt performing 25% of pushing across board.  Performed dynamic sitting balance, trunk control training beginning with pt reclined >> sitting upright with back support on unstable therapy ball during dynamic UE movements in all planes of movement.  Required rest breaks secondary to pain.  Returned to w/c and positioned semi tilted to rest until OT session.  Pt interested in using his room phone; discussed with OT setting up hands free phone system.  OT to f/u.    Therapy Documentation Precautions:  Precautions Precautions: Fall Precaution Comments: high risk for skin breakdown Required Braces or Orthoses: Other Brace/Splint Other Brace/Splint: Bilateral Prevalon boots Restrictions Weight Bearing Restrictions: No Pain: Pain Assessment Pain Assessment: No c/o pain at rest but continues to c/o sacral and L shoulder pain when sitting unsupported.  Allowed time to reposition during therapy session and pt positioned at end  of session with Kpad behind L shoulder.  Pt reporting MD to perform trigger point injection later today.  May still attempt TENS unit.   See FIM for current functional status  Therapy/Group: Individual Therapy  Malachy Mood 02/15/2014, 10:26 AM

## 2014-02-15 NOTE — Progress Notes (Signed)
Occupational Therapy Session Note  Patient Details  Name: Jason [name] MRN: [id number] Date of Birth: 1986/09/06  Today's Date: 02/15/2014 Time: 6712-4580 Time Calculation (min): 27 min  Skilled Therapeutic Interventions/Progress Updates:    Pt seen for individual OT treatment session with focus on bed mobility, sitting EOB and increased activity tolerance. Pt was total A supine to sit EOB +2 using leg loops (Pt 50%). In sitting, pt c/o feeling "nauseous & dizzy" Pt assisted back to bed/supine and BP assessed, RN made aware, see "Vitals" below. Pt HOB raised and using bilateral hands to hold cup and drink ginger ale w/ set up-min guard assist. Discussed use of adaptive utensils w/ pt and pt's mother (pt has universal cuff in rom noted). Nausea/dizziness impacting therapy session this am.  Therapy Documentation Precautions:  Precautions Precautions: Fall Precaution Comments: high risk for skin breakdown Required Braces or Orthoses: Other Brace/Splint Other Brace/Splint: Bilateral Prevalon boots Restrictions Weight Bearing Restrictions: No   Vital signs: BP assessed supine, HOB elevated after c/o dizziness/nausea:  BP 93/63 w/ corset on sitting up in bed. O2 98% on room air, HR 92. Pt reclined in bed and RN notified.   Pain: Pain Assessment Pain Assessment: 0-10 Pain Score: 0-No pain ADL: ADL ADL Comments: refer to FIM     See FIM for current functional status  Therapy/Group: Individual Therapy  Jason [name] 02/15/2014, 11:14 AM

## 2014-02-15 NOTE — Progress Notes (Signed)
Subjective/Complaints:   Still having neck/shoulder soreness. Feels generally tight/painful along neck and upper back.  A 12 point review of systems has been performed and if not noted above is otherwise negative.   Objective: Vital Signs: Blood pressure 110/74, pulse 92, temperature 98.4 F (36.9 C), temperature source Oral, resp. rate 18, weight 61.735 kg (136 lb 1.6 oz), SpO2 99.00%. Dg Shoulder Left  02/13/2014   CLINICAL DATA:  Pain with movement  EXAM: LEFT SHOULDER - 2+ VIEW  COMPARISON:  DG SHOULDER*L* dated 07/06/2013  FINDINGS: There has been resorption along the fracture line of lateral aspect left clavicle, with adjacent sclerosis in the major fracture fragments. There has been retraction of the fracture fragments, now distracted approximately 7 mm. Coracoclavicular space is preserved. No new fracture or dislocation.  IMPRESSION: Nonunion of previously noted clavicle fracture, with 7 mm distraction of fracture fragments.   Electronically Signed   By: Arne Cleveland M.D.   On: 02/13/2014 15:25   Results for orders placed during the hospital encounter of 02/05/14 (from the past 72 hour(s))  GLUCOSE, CAPILLARY     Status: Abnormal   Collection Time    02/12/14 11:14 AM      Result Value Ref Range   Glucose-Capillary 300 (*) 70 - 99 mg/dL   Comment 1 Notify RN    GLUCOSE, CAPILLARY     Status: Abnormal   Collection Time    02/12/14  4:26 PM      Result Value Ref Range   Glucose-Capillary 167 (*) 70 - 99 mg/dL   Comment 1 Notify RN    GLUCOSE, CAPILLARY     Status: Abnormal   Collection Time    02/12/14  8:00 PM      Result Value Ref Range   Glucose-Capillary 177 (*) 70 - 99 mg/dL  GLUCOSE, CAPILLARY     Status: Abnormal   Collection Time    02/13/14  7:31 AM      Result Value Ref Range   Glucose-Capillary 174 (*) 70 - 99 mg/dL  GLUCOSE, CAPILLARY     Status: None   Collection Time    02/13/14 11:29 AM      Result Value Ref Range   Glucose-Capillary 96  70 - 99 mg/dL   GLUCOSE, CAPILLARY     Status: Abnormal   Collection Time    02/13/14  3:51 PM      Result Value Ref Range   Glucose-Capillary 46 (*) 70 - 99 mg/dL  GLUCOSE, CAPILLARY     Status: Abnormal   Collection Time    02/13/14  4:42 PM      Result Value Ref Range   Glucose-Capillary 103 (*) 70 - 99 mg/dL  GLUCOSE, CAPILLARY     Status: Abnormal   Collection Time    02/13/14 10:19 PM      Result Value Ref Range   Glucose-Capillary 278 (*) 70 - 99 mg/dL   Comment 1 Notify RN    GLUCOSE, CAPILLARY     Status: Abnormal   Collection Time    02/14/14  6:58 AM      Result Value Ref Range   Glucose-Capillary 181 (*) 70 - 99 mg/dL   Comment 1 Notify RN    GLUCOSE, CAPILLARY     Status: Abnormal   Collection Time    02/14/14 11:39 AM      Result Value Ref Range   Glucose-Capillary 68 (*) 70 - 99 mg/dL  GLUCOSE, CAPILLARY  Status: Abnormal   Collection Time    02/14/14 12:03 PM      Result Value Ref Range   Glucose-Capillary 65 (*) 70 - 99 mg/dL  GLUCOSE, CAPILLARY     Status: Abnormal   Collection Time    02/14/14 12:19 PM      Result Value Ref Range   Glucose-Capillary 63 (*) 70 - 99 mg/dL  GLUCOSE, CAPILLARY     Status: Abnormal   Collection Time    02/14/14  1:13 PM      Result Value Ref Range   Glucose-Capillary 329 (*) 70 - 99 mg/dL  GLUCOSE, CAPILLARY     Status: Abnormal   Collection Time    02/14/14  2:58 PM      Result Value Ref Range   Glucose-Capillary 191 (*) 70 - 99 mg/dL  GLUCOSE, CAPILLARY     Status: Abnormal   Collection Time    02/14/14  4:52 PM      Result Value Ref Range   Glucose-Capillary 232 (*) 70 - 99 mg/dL  GLUCOSE, CAPILLARY     Status: Abnormal   Collection Time    02/14/14  9:37 PM      Result Value Ref Range   Glucose-Capillary 207 (*) 70 - 99 mg/dL   Comment 1 Notify RN    GLUCOSE, CAPILLARY     Status: Abnormal   Collection Time    02/15/14  7:32 AM      Result Value Ref Range   Glucose-Capillary 168 (*) 70 - 99 mg/dL     HEENT:  normal Cardio: RRR and no murmur Resp: CTA B/L and unlabored GI: BS positive and mildly distended and tympanitic, mild tenderness Extremity:  No Edema Skin:  Wounds on both ankles with eschar,debris, stage 2 on sacrum. Large elbow wound with eschar and fibronecrotic tissue Neuro: Alert/Oriented, Abnormal Sensory absent in feet and Abnormal Motor 0/5 in LE,0/5 finger extension and 0/5 hand intrinsic. Wrist extension 2-, tricep   2, biceps 5/5. Decreased sensation below elbows bilaterally Musc/Skel:  Normal Gen NAD   Assessment/Plan: 1. 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 Physiatrist is providing close team supervision and 24 hour management of active medical problems listed below. Physiatrist and rehab team continue to assess barriers to discharge/monitor patient progress toward functional and medical goals.       FIM: FIM - Bathing Bathing Steps Patient Completed: Chest;Abdomen Bathing: 2: Max-Patient completes 3-4 [id number] parts or 25-49%  FIM - Upper Body Dressing/Undressing Upper body dressing/undressing steps patient completed: Thread/unthread right sleeve of pullover shirt/dresss;Thread/unthread left sleeve of pullover shirt/dress;Pull shirt over trunk;Put head through opening of pull over shirt/dress Upper body dressing/undressing: 2: Max-Patient completed 25-49% of tasks FIM - Lower Body Dressing/Undressing Lower body dressing/undressing: 1: Total-Patient completed less than 25% of tasks  FIM - Toileting Toileting: 1: Total-Patient completed zero steps, helper did all 3  FIM - TAir cabin crewTransfers: 0-Activity did not occur  FIM - BControl and instrumentation engineerDevices: Sliding board Bed/Chair Transfer: 1: Two helpers  FIM - Locomotion: Wheelchair Locomotion: Wheelchair: 1: Total Assistance/staff pushes wheelchair (Pt<25%) FIM -  Locomotion: Ambulation Locomotion: Ambulation: 0: Activity did not occur  Comprehension Comprehension Mode: Auditory Comprehension: 5-Understands basic 90% of the time/requires cueing < 10% of the time  Expression Expression Mode: Verbal Expression: 5-Expresses basic 90% of the time/requires cueing < 10% of the time.  Social Interaction Social  Interaction: 5-Interacts appropriately 90% of the time - Needs monitoring or encouragement for participation or interaction.  Problem Solving Problem Solving: 5-Solves basic problems: With no assist  Memory Memory: 5-Recognizes or recalls 90% of the time/requires cueing < 10% of the time  Medical Problem List and Plan:  1. 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 DVT Prophylaxis/Anticoagulation: Pharmaceutical: Lovenox  3. Chronic Pain Management: Continue MS contin 30 mg bid. Air bed for back and buttock pain as well as pressure relief measures.   -reminded him to use heating pad for shoulder/neck symptoms, good posture when in bed and chair  -?trigger point over trap/supraspinatus on left---consider TPI today  -has clavicle fracture also in this general area with distraction laterally---may need to limit left shoulder for transfers  4. Mood: reviewed anxiety with patient. Discussed triggers and ways to de-escalate. Also have added prn xanax if he is unable to manage it.  -neuropsych assessment also requested 5. Neuropsych: This patient is capable of making decisions on his own behalf.  6. Spinal cord infarct: Initiate steroid taper and monitor for BS changes as dose decreases.--   7. DM type 2 with gastroparesis: Brittle diabetic per mother--variable intake due to GI symptoms/gastroparesis with subsequent wide fluctuations. Increase PM lantus to 30--, keep am dose 6u. Now on 3u novolog units tid ac for meal coverage. Monitor and adjust as steroid tapers off.   -may  need endocrine consult 8. ChronicBLE wounds with chronically elevated ESR: continue doxycline for 30 days (started 02/02/14). Follow up with ID after discharge. Continue local wound care.   -hydrotherapy to elbow?---d/w ID 9. Constipation/diabetic gastroparesis/neurogenic bowel: Will resume reglan to help with constipation as well as GI symptoms . Bowel program  10. Severe diabetic peripheral neuropathy: Continue neurontin  124m bid an 4070m qhs   11. Pulmonary: poor secretion management, weak cough- discussed deep breathing, IS/FV, CPAP -respiratory therapist and mother working on clearance of secretions and coughing techniques  -keep hob at 30 degrees  -chest PT - CXR with atelectasis recently--IS/FV   LOS (Days) 10 A FACE TO FACE EVALUATION WAS PERFORMED  ZaMeredith Staggers/08/2014, 8:28 AM

## 2014-02-16 ENCOUNTER — Inpatient Hospital Stay (HOSPITAL_COMMUNITY): Payer: Self-pay | Admitting: *Deleted

## 2014-02-16 ENCOUNTER — Inpatient Hospital Stay (HOSPITAL_COMMUNITY): Payer: Medicaid Other | Admitting: Occupational Therapy

## 2014-02-16 ENCOUNTER — Telehealth: Payer: Self-pay | Admitting: Family Medicine

## 2014-02-16 ENCOUNTER — Inpatient Hospital Stay (HOSPITAL_COMMUNITY): Payer: Medicaid Other | Admitting: *Deleted

## 2014-02-16 DIAGNOSIS — G8250 Quadriplegia, unspecified: Secondary | ICD-10-CM

## 2014-02-16 DIAGNOSIS — K3184 Gastroparesis: Secondary | ICD-10-CM

## 2014-02-16 DIAGNOSIS — L03119 Cellulitis of unspecified part of limb: Secondary | ICD-10-CM

## 2014-02-16 DIAGNOSIS — L02419 Cutaneous abscess of limb, unspecified: Secondary | ICD-10-CM

## 2014-02-16 DIAGNOSIS — G9519 Other vascular myelopathies: Secondary | ICD-10-CM

## 2014-02-16 DIAGNOSIS — E291 Testicular hypofunction: Secondary | ICD-10-CM

## 2014-02-16 DIAGNOSIS — E1149 Type 2 diabetes mellitus with other diabetic neurological complication: Secondary | ICD-10-CM

## 2014-02-16 LAB — GLUCOSE, CAPILLARY
GLUCOSE-CAPILLARY: 246 mg/dL — AB (ref 70–99)
GLUCOSE-CAPILLARY: 226 mg/dL — AB (ref 70–99)
Glucose-Capillary: 245 mg/dL — ABNORMAL HIGH (ref 70–99)
Glucose-Capillary: 224 mg/dL — ABNORMAL HIGH (ref 70–99)

## 2014-02-16 NOTE — Progress Notes (Signed)
Physical Therapy Session Note  Patient Details  Name: Jason [name] MRN: [id number] Date of Birth: [birth year]  Today's Date: 02/16/2014 Time: 1030-1100 Time Calculation (min): 30 min  Short Term Goals: Week 1:  PT Short Term Goal 1 (Week 1): Patient will perfom Bed Mobility Max-assist x 2 PT Short Term Goal 2 (Week 1): Patient will perform Transfers Max-assist x 2 PT Short Term Goal 3 (Week 1): Dietitian with Verbal Understanding PT Short Term Goal 4 (Week 1): Static Sitting Balance EOB with Max-assist  Skilled Therapeutic Interventions/Progress Updates:  Hand-off from PT, pt in semi-reclined position due to  feeling "lightheaded" and nauseous> BP noted and monitored with Rec therapy. Tx continued to focus on sitting balance and core control tasks with reaching in/at limites of BOS with mod A for static with 2 UE support, max/total for dynamic with 1 UE support. Pt able to tolerate up to 71min in supported sitting with cues for problem solving hand placement for support. Pt getting quite groggy from meds, needing +2 for SB back to [facility] for safety, board placement, and advancing. Pt able to use leg loops for positioning on leg rests. Returned to room with mom and all needs in reach.       Therapy Documentation Precautions:  Precautions Precautions: Fall Precaution Comments: high risk for skin breakdown Required Braces or Orthoses: Other Brace/Splint Other Brace/Splint: Bilateral Prevalon boots Restrictions Weight Bearing Restrictions: No General: Amount of Missed PT Time (min): 17 Minutes Missed Time Reason: Patient fatigue (patient received morphine and 2 oxy) Vital Signs:   Pain: Pain Assessment Pain Assessment: No/denies pain Pain Score: 0-No pain  See FIM for current functional status  Therapy/Group: Co-Treatment Rec therapy Kennieth Rad, PT, DPT  02/16/2014, 12:42 PM

## 2014-02-16 NOTE — Progress Notes (Signed)
INITIAL NUTRITION ASSESSMENT  DOCUMENTATION CODES Per approved criteria  -Severe malnutrition in the context of chronic illness   INTERVENTION: 1.  General healthful diet; encourage intake of foods and beverages as able.  RD to follow and assess for nutritional adequacy.   NUTRITION DIAGNOSIS: Inadequate oral intake related to poor appetite as evidenced by pt report.   Monitor:  1.  Food/Beverage; pt meeting >/=90% estimated needs with tolerance. 2.  Wt/wt change; monitor trends  Reason for Assessment: Low Braden  28 y.o. male  Admitting Dx: deconditioning  ASSESSMENT: Pt with h/o Type 1 diabetes, diabetic neuropathy with sensory deficits, as well as BLE diabetic ulcers, as well as gastroparesis admitted with lethargy and inability to ambulate.  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  Spinal cord infarct suspected at this time.   Pt now admitted to rehab for ongoing rehabilitation.  RD drawn to chart due to Low Braden score.  Pt has been eating well since admission to rehab. RD met with pt who intermittently falls asleep during assessment.  He endorse poor PO PTA, however he does feel he has been eating better over the past 1.5 weeks.  Declines offer for nutrition supplements at this time which in the setting of adequate intake is appropriate. Discussed the importance of nutrition. Instructed pt to request return visit from RD if his intake does not remain 100% of most meals.  RD notes pt was diagnosed with severe malnutrition of acute illness in December 2014.  Physical exam reveals further muscle loss with negligible weight change.  RD will also follow for ongoing assessment of adequacy in pt who continues to meet criteria for malnutrition.   Nutrition Focused Physical Exam: Subcutaneous Fat:  Orbital Region: mild wasting Upper Arm Region: moderate wasting Thoracic and Lumbar Region: mild-moderate wasting  Muscle:  Temple Region: mild wasting Clavicle Bone  Region: severe wasting Clavicle and Acromion Bone Region: severe wasting Scapular Bone Region: severe wasting Dorsal Hand: moderate wasting Patellar Region: severe wasting, component of disuse Anterior Thigh Region: severe wasting, component of disuse Posterior Calf Region: severe wasting, component of disuse  Edema: none present  Pt meets criteria for severe MALNUTRITION in the context of chronic as evidenced by moderate-severe subcutaneous and muscle wasting, as well as pt report of poor PO PTA.  Height: Ht Readings from Last 1 Encounters:  01/30/14 5' 7.5" (1.715 m)    Weight: Wt Readings from Last 1 Encounters:  02/16/14 135 lb (61.236 kg)    Ideal Body Weight: 154 lbs  % Ideal Body Weight: 86%  Wt Readings from Last 10 Encounters:  02/16/14 135 lb (61.236 kg)  02/04/14 149 lb 11.1 oz (67.9 kg)  12/25/13 126 lb 15.8 oz (57.6 kg)  12/22/13 154 lb (69.854 kg)  10/13/13 154 lb (69.854 kg)  10/05/13 145 lb (65.772 kg)  09/13/13 136 lb (61.689 kg)  08/26/13 139 lb (63.05 kg)  08/21/13 125 lb (56.7 kg)  08/05/13 125 lb (56.7 kg)    Usual Body Weight: 157 lbs per chart review  % Usual Body Weight: 84%  BMI:  Body mass index is 20.82 kg/(m^2).  Estimated Nutritional Needs: Kcal: 1800-2000  Protein: 70-80 grams  Fluid: 2 L/day  Skin: intact, diabetic ulcers  Diet Order: Carb Control  EDUCATION NEEDS: -Education needs addressed   Intake/Output Summary (Last 24 hours) at 02/16/14 1039 Last data filed at 02/16/14 0745  Gross per 24 hour  Intake    580 ml  Output  650 ml  Net    -70 ml    Last BM: 5/11  Labs:  No results found for this basename: NA, K, CL, CO2, BUN, CREATININE, CALCIUM, MG, PHOS, GLUCOSE,  in the last 168 hours  CBG (last 3)   Recent Labs  02/15/14 1643 02/15/14 2058 02/16/14 0718  GLUCAP 101* 130* 245*    Scheduled Meds: . bisacodyl  10 mg Rectal Q2000  . chlorhexidine  15 mL Mouth Rinse BID  . collagenase   Topical Daily   . docusate sodium  100 mg Oral BID  . doxycycline  100 mg Oral Q12H  . enoxaparin (LOVENOX) injection  40 mg Subcutaneous Q24H  . gabapentin  100 mg Oral BID  . gabapentin  400 mg Oral QHS  . insulin aspart  0-9 Units Subcutaneous TID WC  . insulin aspart  3 Units Subcutaneous TID WC  . insulin aspart  3-6 Units Subcutaneous QHS  . insulin aspart  4 Units Subcutaneous Once  . insulin glargine  25 Units Subcutaneous QHS  . insulin glargine  6 Units Subcutaneous Daily  . lidocaine  1 patch Transdermal Q24H  . loratadine  10 mg Oral Daily  . metoCLOPramide  5 mg Oral TID AC & HS  . morphine  30 mg Oral Q12H  . polyethylene glycol  17 g Oral Daily  . saccharomyces boulardii  250 mg Oral BID    Continuous Infusions:   Past Medical History  Diagnosis Date  . Diabetes mellitus   . GERD (gastroesophageal reflux disease)   . Asthma   . Hx MRSA infection     on face  . Gastroparesis   . Diabetic neuropathy   . Seizures     Past Surgical History  Procedure Laterality Date  . Tonsillectomy      Brynda Greathouse, MS RD LDN Clinical Inpatient Dietitian Pager: [phone] Weekend/After hours pager: [phone]

## 2014-02-16 NOTE — Progress Notes (Addendum)
Recreational Therapy Session Note  Patient Details  Name: BAIRON KLEMANN MRN: [id number] Date of Birth: 1986-05-16 Today's Date: 02/16/2014  Pain: c/o 8/10 back & neck pain, premedicated, positional changes for relief during session Skilled Therapeutic Interventions/Progress Updates: Session focused on activity tolerance, slide board transfers, static & dynamic sitting balance reaching outside BOS.  Pt performed slide board transfers with mod assist from w/c to mat & then from mat to w/c with Total assist +2.  Pt able to maintain static sitting balance EOM with BUE support with mod assist and max-total assist for dynamic sitting balance with 1 UE support.  Pt with c/o feeling "lightheaded" & "nauseous" and/or "drunk" throughout session, needing to be reclined on mat with feet elevated.  Pt wearing knee high TEDs & abdominal binder.    Seated in w/c beginning of session:      BP 109/67 HR 91 Seated EOM after transfer, pt with c/o not feeling well: BP 79/51 HR  94 Pt reclined on mat with feet up ~5 minutes   BP 115/78 HR 91  Pt performed simple card activity seated EOM reaching outside BOS ~5 minutes before becoming symptomatic again, pt reclined on mat.           BP  96/70   HR 87  Symptoms resolved & pt returned to sitting EOM for reaching activity.  Pt able to tolerate 7 minutes of reaching activity before becoming symptomatic again.  Pt transferred back to w/c using slide board.  Pt returned to room with mom and positioned in semi-reclined position to rest prior to next therapy session.  Therapy/Group: Co-Treatment  Waldon Reining 02/16/2014, 1:41 PM

## 2014-02-16 NOTE — Progress Notes (Signed)
Physical Therapy Weekly Progress Note  Patient Details  Name: Jason [name] MRN: [id number] Date of Birth: [birth year]  Beginning of progress report period: Feb 05, 2014 End of progress report period: Feb 16, 2014  Today's Date: 02/16/2014  Patient has met 3 of 4 short term goals.  Pt has increased independence with functional mobility and adaptive equipment.  He is currently max A with bed mobility using leg loops and bed rails, max A with seated balance and mod-max A with sliding board transfers bed <> chair with +2 for safety especially when fatigued.  Patient continues to demonstrate the following deficits:decreased activity tolerance, increased pain, impaired core strength and balance, and therefore will continue to benefit from skilled PT intervention to enhance overall performance with activity tolerance, balance, postural control, ability to compensate for deficits and functional use of  right upper extremity and left upper extremity.  Patient progressing toward long term goals..  Continue plan of care.  PT Short Term Goals Week 1:  PT Short Term Goal 1 (Week 1): Patient will perfom Bed Mobility Max-assist x 2 PT Short Term Goal 1 - Progress (Week 1): Met PT Short Term Goal 2 (Week 1): Patient will perform Transfers Max-assist x 2 PT Short Term Goal 2 - Progress (Week 1): Met PT Short Term Goal 3 (Week 1): Dietitian with Verbal Understanding PT Short Term Goal 3 - Progress (Week 1): Progressing toward goal PT Short Term Goal 4 (Week 1): Static Sitting Balance EOB with Max-assist PT Short Term Goal 4 - Progress (Week 1): Met Week 2:  PT Short Term Goal 1 (Week 2): Pt will perform functional transfers with max A of 1 PT Short Term Goal 2 (Week 2): Pt will tolerate sitting edge of bed for functional activities x 5 minutes with mod A PT Short Term Goal 3 (Week 2): pt will propel w/c with supervision x 50' in controlled environment    See FIM for current  functional status    [name] 02/16/2014, 2:01 PM

## 2014-02-16 NOTE — Progress Notes (Signed)
Physical Therapy Session Note  Patient Details  Name: Jason [name] MRN: [id number] Date of Birth: 10/04/1986  Today's Date: 02/16/2014 Time: 3846-6599 Time Calculation (min): 13 min  Short Term Goals: Week 1:  PT Short Term Goal 1 (Week 1): Patient will perfom Bed Mobility Max-assist x 2 PT Short Term Goal 2 (Week 1): Patient will perform Transfers Max-assist x 2 PT Short Term Goal 3 (Week 1): Dietitian with Verbal Understanding PT Short Term Goal 4 (Week 1): Static Sitting Balance EOB with Max-assist  Skilled Therapeutic Interventions/Progress Updates:    Patient received sitting in wheelchair, very lethargic, but agreeable to therapy. Session focused on wheelchair mobility. Patient able to propel wheelchair with B UE x25' with minA progressing to supervision. After completion, patient unable to maintain eyes open, very lethargic. Patient reported he received morphine and 2 oxycodones and feels very tired. Discussion with RN to keep patient sitting upright in wheelchair since lunch will be arriving. Lunch to be attempted sitting up in wheelchair to facilitate improved alertness. Patient left sitting in wheelchair with mother present and all needs within reach.  Therapy Documentation Precautions:  Precautions Precautions: Fall Precaution Comments: high risk for skin breakdown Required Braces or Orthoses: Other Brace/Splint Other Brace/Splint: Bilateral Prevalon boots Restrictions Weight Bearing Restrictions: No General: Amount of Missed PT Time (min): 17 Minutes Missed Time Reason: Patient fatigue (patient received morphine and 2 oxy) Pain: Pain Assessment Pain Assessment: No/denies pain Pain Score: 0-No pain  See FIM for current functional status  Therapy/Group: Individual Therapy  Lillia Abed. Hershell Brandl, PT, DPT 02/16/2014, 11:49 AM

## 2014-02-16 NOTE — Progress Notes (Signed)
Occupational Therapy Session Notes  Patient Details  Name: Jason [name] MRN: [id number] Date of Birth: 1986/03/09  Today's Date: 02/16/2014 Time: [phone] and 100-155 Time Calculation (min): 60 min and 55 min  Short Term Goals: Week 2:  OT Short Term Goal 1 (Week 2): Sitting Balance:  Max assist EOB for 10 min OT Short Term Goal 2 (Week 2): UB Dressing:  Patient will pull shirt down in the back with min assist for stability OT Short Term Goal 3 (Week 2): LB dressing:  Patient will donn pants over feet in long/circle sitting with min assist OT Short Term Goal 4 (Week 2): Grooming: patient will brush teeth sitting in w/c at sink with min assist for set up PRN OT Short Term Goal 5 (Week 2): Communication: Patient will demonstrate use of hospital phone with supervision  Skilled Therapeutic Interventions/Progress Updates:  1)  Patient in bed upon arrival with his mother at his side.  As agreed upon from discussion yesterday, patient scheduled for therapy later this am (9:00) to provide time for patient to eat breakfast and for mom set up and assist with bed bath. Patient wearing brief and ready to begin dressing session then transfer to w/c.  Focused session on functional mobility, bed mobility, sitting balance with HOB up, problem solving, adaptive techniques, practice donning leg loops and use of leg loops during BADL session.  Once pants were placed over his feet and up to his knees, patient able to assist with pulling pants up to waist (80%) with HOB up and with rolling L/R.  Mom provided quad cough assistance 2 times and it was productive once.  With HOB up, patient min-mod assist with leg loops to move BLEs off bed.  Slide board transfer with total assist bed>w/c.  W/c adjusted to provide headrest for comfort and patient agreed to wear soft collar for comfort.  2)  Patient sitting up in w/c and finishing his lunch with U-cuff with spoon in it and on rght hand as he feeds sliced pears to  himself.  [name] reports that she set him up for lunch and he was able to pick up hamburger (cut in half) with hands and feed self approx 25% of it and he is able to pick up styrofoam cup with straw to drink without assistance.  Propelled patient to the family room to watch a DVD of an individual with a SCI performing dressing to gain some insight, inspiration and curiosity.  Before playing DVD, provided further education on need for pressure relief techniques to be performed while up in w/c and patient stated that "it was about time".  This OT tilted patient's tilt in space w/c all the way back with anti tippers in down position.  Patient stated, "I don't think I have ever been back this far".  Reviewed the importance of full tilt for full effect of pressure relief.  When his mother arrived in family room, she stated that he has been tilted back this far-"he just does not remember".  Patient and mother sleepy during DVD viewing however, much discussion occurred and appropriate questions asked.  Patient and mother both stated that the DVD was very helpful.  Therapy Documentation Precautions:  Precautions Precautions: Fall Precaution Comments: high risk for skin breakdown Required Braces or Orthoses: Other Brace/Splint Other Brace/Splint: Bilateral Prevalon boots Restrictions Weight Bearing Restrictions: No Pain: 1)  Left shoulder and neck pain, not rated, premedicated, steroid injection this am by Dr. Naaman Plummer, rest and repositioned.  2)  No reports of pain ADL: See FIM for current functional status  Therapy/Group: Individual Therapy  Gaye Pollack 02/16/2014, 11:46 AM

## 2014-02-16 NOTE — Telephone Encounter (Signed)
Given to his father

## 2014-02-16 NOTE — Consult Note (Addendum)
WOC follow-up: Right elbow with slowly decreasing amt of slough; 80% beefy red and 20% yellow.  Small amt yellow drainage, no odor.  Continue present plan of care with Santyl. Stockingnet given  to hold dressing in place and allow movement of right elbow.  Mother at bedside to assess wound and discuss plan of care and potential difficulty of promoting healing over elbow joint R/T constant movement; she verbalized understanding. Please re-consult if further assistance is needed.  Thank-you,  Julien Girt MSN, [place], [place], [place], [place]

## 2014-02-16 NOTE — Progress Notes (Signed)
Subjective/Complaints:   Still having left neck/shoulder soreness. Working through therapies. Had some increased sensation in legs yesterday A 12 point review of systems has been performed and if not noted above is otherwise negative.   Objective: Vital Signs: Blood pressure 109/72, pulse 93, temperature 98 F (36.7 C), temperature source Oral, resp. rate 18, weight 61.236 kg (135 lb), SpO2 92.00%. No results found. Results for orders placed during the hospital encounter of 02/05/14 (from the past 72 hour(s))  GLUCOSE, CAPILLARY     Status: None   Collection Time    02/13/14 11:29 AM      Result Value Ref Range   Glucose-Capillary 96  70 - 99 mg/dL  GLUCOSE, CAPILLARY     Status: Abnormal   Collection Time    02/13/14  3:51 PM      Result Value Ref Range   Glucose-Capillary 46 (*) 70 - 99 mg/dL  GLUCOSE, CAPILLARY     Status: Abnormal   Collection Time    02/13/14  4:42 PM      Result Value Ref Range   Glucose-Capillary 103 (*) 70 - 99 mg/dL  GLUCOSE, CAPILLARY     Status: Abnormal   Collection Time    02/13/14 10:19 PM      Result Value Ref Range   Glucose-Capillary 278 (*) 70 - 99 mg/dL   Comment 1 Notify RN    GLUCOSE, CAPILLARY     Status: Abnormal   Collection Time    02/14/14  6:58 AM      Result Value Ref Range   Glucose-Capillary 181 (*) 70 - 99 mg/dL   Comment 1 Notify RN    GLUCOSE, CAPILLARY     Status: Abnormal   Collection Time    02/14/14 11:39 AM      Result Value Ref Range   Glucose-Capillary 68 (*) 70 - 99 mg/dL  GLUCOSE, CAPILLARY     Status: Abnormal   Collection Time    02/14/14 12:03 PM      Result Value Ref Range   Glucose-Capillary 65 (*) 70 - 99 mg/dL  GLUCOSE, CAPILLARY     Status: Abnormal   Collection Time    02/14/14 12:19 PM      Result Value Ref Range   Glucose-Capillary 63 (*) 70 - 99 mg/dL  GLUCOSE, CAPILLARY     Status: Abnormal   Collection Time    02/14/14  1:13 PM      Result Value Ref Range   Glucose-Capillary 329 (*)  70 - 99 mg/dL  GLUCOSE, CAPILLARY     Status: Abnormal   Collection Time    02/14/14  2:58 PM      Result Value Ref Range   Glucose-Capillary 191 (*) 70 - 99 mg/dL  GLUCOSE, CAPILLARY     Status: Abnormal   Collection Time    02/14/14  4:52 PM      Result Value Ref Range   Glucose-Capillary 232 (*) 70 - 99 mg/dL  GLUCOSE, CAPILLARY     Status: Abnormal   Collection Time    02/14/14  9:37 PM      Result Value Ref Range   Glucose-Capillary 207 (*) 70 - 99 mg/dL   Comment 1 Notify RN    GLUCOSE, CAPILLARY     Status: Abnormal   Collection Time    02/15/14  7:32 AM      Result Value Ref Range   Glucose-Capillary 168 (*) 70 - 99 mg/dL  GLUCOSE, CAPILLARY  Status: None   Collection Time    02/15/14 11:27 AM      Result Value Ref Range   Glucose-Capillary 71  70 - 99 mg/dL  GLUCOSE, CAPILLARY     Status: Abnormal   Collection Time    02/15/14  4:43 PM      Result Value Ref Range   Glucose-Capillary 101 (*) 70 - 99 mg/dL   Comment 1 Notify RN    GLUCOSE, CAPILLARY     Status: Abnormal   Collection Time    02/15/14  8:58 PM      Result Value Ref Range   Glucose-Capillary 130 (*) 70 - 99 mg/dL  GLUCOSE, CAPILLARY     Status: Abnormal   Collection Time    02/16/14  7:18 AM      Result Value Ref Range   Glucose-Capillary 245 (*) 70 - 99 mg/dL   Comment 1 Notify RN       HEENT: normal Cardio: RRR and no murmur Resp: CTA B/L and unlabored GI: BS positive and mildly distended and tympanitic, mild tenderness Extremity:  No Edema Skin:  Wounds on both ankles with eschar,debris, stage 2 on sacrum. Large elbow wound with eschar and fibronecrotic tissue Neuro: Alert/Oriented, Abnormal Sensory absent in feet and Abnormal Motor 0/5 in LE,0/5 finger extension and 0/5 hand intrinsic. Wrist extension 2-, tricep   2, biceps 5/5. Decreased sensation below elbows bilaterally Musc/Skel:  Normal Gen NAD   Assessment/Plan: 1. Functional deficits secondary to tetraparesis which require  3+ hours per day of interdisciplinary therapy in a comprehensive inpatient rehab setting. Physiatrist is providing close team supervision and 24 hour management of active medical problems listed below. Physiatrist and rehab team continue to assess barriers to discharge/monitor patient progress toward functional and medical goals.       FIM: FIM - Bathing Bathing Steps Patient Completed: Chest;Abdomen Bathing: 2: Max-Patient completes 3-4 26f10 parts or 25-49%  FIM - Upper Body Dressing/Undressing Upper body dressing/undressing steps patient completed: Thread/unthread right sleeve of pullover shirt/dresss;Thread/unthread left sleeve of pullover shirt/dress;Pull shirt over trunk;Put head through opening of pull over shirt/dress Upper body dressing/undressing: 2: Max-Patient completed 25-49% of tasks FIM - Lower Body Dressing/Undressing Lower body dressing/undressing: 1: Total-Patient completed less than 25% of tasks  FIM - Toileting Toileting: 1: Total-Patient completed zero steps, helper did all 3  FIM - TAir cabin crewTransfers: 0-Activity did not occur  FIM - BControl and instrumentation engineerDevices: Sliding board Bed/Chair Transfer: 1: Two helpers  FIM - Locomotion: Wheelchair Locomotion: Wheelchair: 1: Total Assistance/staff pushes wheelchair (Pt<25%) FIM - Locomotion: Ambulation Locomotion: Ambulation: 0: Activity did not occur  Comprehension Comprehension Mode: Auditory Comprehension: 5-Understands basic 90% of the time/requires cueing < 10% of the time  Expression Expression Mode: Verbal Expression: 5-Expresses basic 90% of the time/requires cueing < 10% of the time.  Social Interaction Social Interaction: 5-Interacts appropriately 90% of the time - Needs monitoring or encouragement for participation or interaction.  Problem Solving Problem Solving: 5-Solves basic problems: With no assist  Memory Memory: 5-Recognizes or recalls 90% of the  time/requires cueing < 10% of the time  Medical Problem List and Plan:  1. 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 DVT Prophylaxis/Anticoagulation: Pharmaceutical: Lovenox  3. Chronic Pain Management: Continue MS contin 30 mg bid. Air bed for back and buttock pain as well as pressure relief measures.   -reminded him to use heating pad for shoulder/neck  symptoms, good posture when in bed and chair  -trigger point over trap/supraspinatus on left---will do TPI today  -has clavicle fracture also in this general area with distraction laterally---denies any pain in this area. 4. Mood: reviewed anxiety with patient. Discussed triggers and ways to de-escalate. Also have added prn xanax if he is unable to manage it.  -neuropsych assessment also requested 5. Neuropsych: This patient is capable of making decisions on his own behalf.  6. Spinal cord infarct: Initiate steroid taper and monitor for BS changes as dose decreases.--   7. DM type 2 with gastroparesis: Brittle diabetic per mother--variable intake due to GI symptoms/gastroparesis with subsequent wide fluctuations. Increased PM lantus to 30--, keep am dose 6u. Now on 3u novolog units tid ac for meal coverage. Monitor and adjust as steroid tapers off.  -showing some improvement   -may need endocrine consult 8. ChronicBLE wounds with chronically elevated ESR: continue doxycline for 30 days (started 02/02/14). Follow up with ID after discharge. Continue local wound care.   -right elbow granulating in  -wet-dry with allevyn over right popliteal area 9. Constipation/diabetic gastroparesis/neurogenic bowel: Will resume reglan to help with constipation as well as GI symptoms . Bowel program  10. Severe diabetic peripheral neuropathy: Continue neurontin  131m bid an 4026m qhs   11. Pulmonary: poor secretion management, weak cough- discussed deep breathing, IS/FV,  CPAP -respiratory therapist and mother working on clearance of secretions and coughing techniques  -keep hob at 30 degrees  -chest PT   LOS (Days) 11 A FACE TO FACE EVALUATION WAS PERFORMED  ZaMeredith Staggers/09/2014, 8:23 AM

## 2014-02-16 NOTE — Progress Notes (Signed)
Physical Therapy Note  Patient Details  Name: CALLUM WOLF MRN: [id number] Date of Birth: 02/19/1986 Today's Date: 02/16/2014  Time: 1000-1030 30 minutes  1:1 pt c/o no pain at rest, says shoulders feel "ok". Pt transferred with sliding board w/c to mat with mod A for transfer, pt with good push through UEs, requires most assistance for trunk control. Pt seated edge of mat to focus on core control and balance with mod A for static with 2 UE support, max/total for dynamic with 1 UE support.  Pt limited thorughout session by feeling "lightheaded" and nauseous, difficulty tolerating upright sitting.  Seated in w/c beginning of session: BP 109/67 HR 91  Seated EOM after transfer, pt with c/o not feeling well: BP 79/51 HR 94  Pt reclined on mat with feet up ~5 minutes BP 115/78 HR 91   Danae Orleans Makia Bossi 02/16/2014, 11:45 AM

## 2014-02-17 ENCOUNTER — Inpatient Hospital Stay (HOSPITAL_COMMUNITY): Payer: Medicaid Other | Admitting: Physical Therapy

## 2014-02-17 ENCOUNTER — Inpatient Hospital Stay (HOSPITAL_COMMUNITY): Payer: Medicaid Other

## 2014-02-17 ENCOUNTER — Inpatient Hospital Stay (HOSPITAL_COMMUNITY): Payer: Medicaid Other | Admitting: *Deleted

## 2014-02-17 ENCOUNTER — Inpatient Hospital Stay (HOSPITAL_COMMUNITY): Payer: Medicaid Other | Admitting: Occupational Therapy

## 2014-02-17 DIAGNOSIS — L03119 Cellulitis of unspecified part of limb: Secondary | ICD-10-CM

## 2014-02-17 DIAGNOSIS — E291 Testicular hypofunction: Secondary | ICD-10-CM

## 2014-02-17 DIAGNOSIS — K3184 Gastroparesis: Secondary | ICD-10-CM

## 2014-02-17 DIAGNOSIS — E1149 Type 2 diabetes mellitus with other diabetic neurological complication: Secondary | ICD-10-CM

## 2014-02-17 DIAGNOSIS — G8250 Quadriplegia, unspecified: Secondary | ICD-10-CM

## 2014-02-17 DIAGNOSIS — G9519 Other vascular myelopathies: Secondary | ICD-10-CM

## 2014-02-17 DIAGNOSIS — L02419 Cutaneous abscess of limb, unspecified: Secondary | ICD-10-CM

## 2014-02-17 LAB — URINALYSIS, ROUTINE W REFLEX MICROSCOPIC
Bilirubin Urine: NEGATIVE
Glucose, UA: 100 mg/dL — AB
Ketones, ur: NEGATIVE mg/dL
LEUKOCYTES UA: NEGATIVE
NITRITE: NEGATIVE
PH: 5.5 (ref 5.0–8.0)
Protein, ur: 100 mg/dL — AB
SPECIFIC GRAVITY, URINE: 1.02 (ref 1.005–1.030)
Urobilinogen, UA: 0.2 mg/dL (ref 0.0–1.0)

## 2014-02-17 LAB — BASIC METABOLIC PANEL
BUN: 29 mg/dL — ABNORMAL HIGH (ref 6–23)
CHLORIDE: 105 meq/L (ref 96–112)
CO2: 27 meq/L (ref 19–32)
Calcium: 9 mg/dL (ref 8.4–10.5)
Creatinine, Ser: 0.88 mg/dL (ref 0.50–1.35)
GFR calc Af Amer: >90 mL/min (ref >90)
GFR calc non Af Amer: >90 mL/min (ref >90)
GLUCOSE: 121 mg/dL — AB (ref 70–99)
Potassium: 4.3 mEq/L (ref 3.7–5.3)
SODIUM: 141 meq/L (ref 137–147)

## 2014-02-17 LAB — URINE MICROSCOPIC-ADD ON

## 2014-02-17 LAB — GLUCOSE, CAPILLARY
GLUCOSE-CAPILLARY: 49 mg/dL — AB (ref 70–99)
GLUCOSE-CAPILLARY: 74 mg/dL (ref 70–99)
Glucose-Capillary: 216 mg/dL — ABNORMAL HIGH (ref 70–99)
Glucose-Capillary: 47 mg/dL — ABNORMAL LOW (ref 70–99)
Glucose-Capillary: 362 mg/dL — ABNORMAL HIGH (ref 70–99)
Glucose-Capillary: 235 mg/dL — ABNORMAL HIGH (ref 70–99)

## 2014-02-17 LAB — CBC
HCT: 23.9 % — ABNORMAL LOW (ref 39.0–52.0)
HEMOGLOBIN: 7.5 g/dL — AB (ref 13.0–17.0)
MCH: 25.5 pg — ABNORMAL LOW (ref 26.0–34.0)
MCHC: 31.4 g/dL (ref 30.0–36.0)
MCV: 81.3 fL (ref 78.0–100.0)
Platelets: 432 10*3/uL — ABNORMAL HIGH (ref 150–400)
RBC: 2.94 MIL/uL — AB (ref 4.22–5.81)
RDW: 16.5 % — ABNORMAL HIGH (ref 11.5–15.5)
WBC: 13.9 10*3/uL — AB (ref 4.0–10.5)

## 2014-02-17 MED ORDER — INSULIN GLARGINE 100 UNIT/ML ~~LOC~~ SOLN
30.0000 [IU] | Freq: Every day | SUBCUTANEOUS | Status: DC
Start: 2014-02-17 — End: 2014-02-21
  Administered 2014-02-17 – 2014-02-20 (×3): 30 [IU] via SUBCUTANEOUS
  Filled 2014-02-17 (×5): qty 0.3

## 2014-02-17 MED ORDER — INSULIN GLARGINE 100 UNIT/ML ~~LOC~~ SOLN
8.0000 [IU] | Freq: Every day | SUBCUTANEOUS | Status: DC
  Administered 2014-02-18 – 2014-02-21 (×4): 8 [IU] via SUBCUTANEOUS
  Filled 2014-02-17 (×4): qty 0.08

## 2014-02-17 NOTE — Progress Notes (Signed)
Physical Therapy Note  Patient Details  Name: Jason [name] MRN: [id number] Date of Birth: Oct 11, 1985 Today's Date: 02/17/2014  Time: 1000-1100 60 minutes Co tx with TR  1:1 Pt c/o neck and back pain, eased with repositioning.  Bed mobility with use of bed rails and leg loops with min A for rolling, mod A for supine to sit.  Pt continues to require cues for use of bed controls and sequencing and to use bed controls to his advantage.  Sliding board transfers bed <>w/c and w/c <> mat with pt able to perform with mod A, assist for trunk control, cues for UE placement, pt able to use B UEs to push across board.  Pt performed sitting balance edge of mat for reaching activity with improved activity tolerance noted today, able to balance with mod/max A.  Stretching for back and shoulder mm seated  Edge of mat with pt expressing minimal relief.  Pt sitting up in chair at end of session, reclined fully to relieve neck pain.  Pt motivated to stay up in chair despite pain.    Danae Orleans [place] 02/17/2014, 11:06 AM

## 2014-02-17 NOTE — Significant Event (Signed)
Hypoglycemic Event  CBG: 49  Treatment: 15 GM carbohydrate snack  Symptoms: None  Follow-up CBG: Time:1215 CBG Result:74  Possible Reasons for Event: Unknown  Comments/MD notified:Dan [place] PA notified. No new orders received    Warnell Bureau Buffey Zabinski  Remember to initiate Hypoglycemia Order Set & complete

## 2014-02-17 NOTE — Progress Notes (Signed)
Recreational Therapy Session Note  Patient Details  Name: DORSE LOCY MRN: [id number] Date of Birth: [birth year] Today's Date: 02/17/2014  Pain: c/o back & neck pain, repositioning & stretching provided some relief, premedicated Skilled Therapeutic Interventions/Progress Updates: Session focused on activity tolerance, tolerating OOB upright posture, static & dynamic sitting balance.  Pt performed slide board transfer w/c<-->mat with mod assist, +2 for w/c safety.  Pt tolerated sitting EOM without symptoms or c/o of orthostasis. Pt maintained dynamic sitting balance for reaching activity with mod-max assist. Pt reclined in w/c in position of comfort at end of session.  Mom in room with pt.  Therapy/Group: Co-Treatment  Waldon Reining 02/17/2014, 11:39 AM

## 2014-02-17 NOTE — Progress Notes (Signed)
Physical Therapy Session Note  Patient Details  Name: Jason [name] MRN: [id number] Date of Birth: 05-18-1986  Today's Date: 02/17/2014 Time: 1130 and [id number] Time Calculation (min): 50 min  Short Term Goals: Week 2:  PT Short Term Goal 1 (Week 2): Pt will perform functional transfers with max A of 1 PT Short Term Goal 2 (Week 2): Pt will tolerate sitting edge of bed for functional activities x 5 minutes with mod A PT Short Term Goal 3 (Week 2): pt will propel w/c with supervision x 50' in controlled environment  Skilled Therapeutic Interventions/Progress Updates:   AM Session: Pt received asleep reclined in w/c, easily aroused. RN tech arrived to check BG, pt with glucose of 47. RN notified, pt given juice, crackers and PB to eat before therapy. Rechecked after 10 minutes, pt with glucose of 49. RN recommending to hold therapy until blood glucose improves. Pt left sitting in recliner, mom present. Pt missed 45 min of therapy.   PM Session: Session focused on activity tolerance, w/c mobility, and static/dynamic sitting balance. Pt received sitting in w/c ready for therapy, reporting improvement in glucose. In controlled environment, pt propelled w/c using BUEs x 50 ft with min assist. Slide board transfer w/c <> mat and w/c > bed with max A x 1, +2 for safety. Pt requires assist for trunk control and cues for hand placement, improved ability to use BUEs to assist during transfer. Pt able to direct setup prior to transfer with min questioning cues. Sitting edge of mat with PT student behind pt on therapy ball and therapist positioned in front, pt performed anterior/posterior leans with B UEs supported and reaching in/out BOS with mod-max A. Pt returned to room and per mother's report RN wanted patient to return to bed, transferred sit > supine with max A and repositioned in bed total A. Pt with minimal report of tightness in shoulder with reaching activities. Pt repositioned for comfort with  minimal improvement. Pt left semi reclined in bed with prevalon boots donned and mom present.   Therapy Documentation Precautions:  Precautions Precautions: Fall Precaution Comments: sacral breakdown-boost when in w/c, abdominal binder and TEDs due to low BP. Required Braces or Orthoses: Other Brace/Splint Other Brace/Splint: Bilateral Prevalon boots at night Restrictions Weight Bearing Restrictions: No General:  Missed 45 min due to low blood glucose  See FIM for current functional status  Therapy/Group: Individual Therapy  Laretta Alstrom 02/17/2014, 5:01 PM

## 2014-02-17 NOTE — Consult Note (Signed)
NEUROCOGNITIVE STATUS EXAMINATION - [place]   [name] is a 28 year old man, who was referred for a neurocognitive status examination to evaluate his emotional state and mental status.  According to his medical record, Jason [name] was admitted on 01/29/14 with lethargy, bilateral lower extremity weakness, and inability to ambulate.  His medical history is significant for DM type 1, diabetic neuropathy with sensory deficits, and BLE diabetic ulcers X 4 months, as well as gastroparesis.  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  Follow-up MRI of the cervical spine revealed progressive abnormal spinal cord signal now extending throughout upper and mid thoracic cord as far as T8 with mild patchy enhancement about C6 cord level.  Neurology felt that the symptoms were consistent with spinal cord infarct.    Emotional Functioning:  During the clinical interview, Jason [name] initially described his mood as "not good" and disclosed multiple frustrations associated with physical discomfort, inability to take breaks during certain therapies, having to repeat similar therapeutic exercises multiple times, etc.  He commented that he is having trouble sleeping, which is contributing to frustrations as well.  Notably, Jason [name] denied major symptoms of depression, including suicidal ideation.  He also mentioned that he is hyper-focused on his current situation and has not been able to consider the future much.  We explored the impact that this hyper-focus on the current situation may be having on his ability to understand the purposes behind various therapies.  Jason [name] described feeling significantly better after speaking with the neuropsychologist.  Of note, his physical energy seemed to calm from the start of session (highly restless) to  the end of session (laying still).  This physical reaction was pointed out to Jason [name] and he was encouraged to talk about his frustrations in the future to help calm physical symptoms of anxiety.  Jason [name] responses to a self-report measure of mood symptoms were not suggestive of the presence of clinically significant depression at this time.      Mental Status:  Jason [name] total score on an overall measure of mental status was not suggestive of the presence of significant cognitive impairment at this time (MMSE-2 brief = 13/16).  He lost points for misstating the date and for inability to freely recall one of three previously studied words.  Subjectively, he reported noticing slowed processing speed, stating that it is hard for him to "keep up" with information.      Impressions and Recommendations:  Jason [name] overall neurocognitive profile was not suggestive of the presence of marked cognitive impairment at this time.  However, owing to his subjective cognitive concerns, a more comprehensive neuropsychological evaluation could be considered in the future.  From an emotional standpoint, he seems to be experiencing anxious mood, mostly related to physical discomfort and its effects.  It may be helpful for staff members to explain the reasons behind asking him to repeat the same exercise multiple times.  In addition, staff members should be aware that if Jason [name] states that he needs to stop an exercise due to discomfort, he is not requesting to stop completely, but rather to stop in order to "re-group" for a minute or two and then he will continue.   If desired, a follow-up neuropsychological consult for support could be provided.    DIAGNOSES: Spinal Cord Infarct Adjustment disorder with anxious mood  Marlane Hatcher, Psy.D.  Clinical Neuropsychologist

## 2014-02-17 NOTE — Progress Notes (Signed)
Subjective/Complaints:   Neck feels better after TPI. Still sore, but able to tolerate activity much better yesterday. A 12 point review of systems has been performed and if not noted above is otherwise negative.   Objective: Vital Signs: Blood pressure 99/66, pulse 95, temperature 99.3 F (37.4 C), temperature source Oral, resp. rate 18, weight 61.236 kg (135 lb), SpO2 97.00%. No results found. Results for orders placed during the hospital encounter of 02/05/14 (from the past 72 hour(s))  GLUCOSE, CAPILLARY     Status: Abnormal   Collection Time    02/14/14 11:39 AM      Result Value Ref Range   Glucose-Capillary 68 (*) 70 - 99 mg/dL  GLUCOSE, CAPILLARY     Status: Abnormal   Collection Time    02/14/14 12:03 PM      Result Value Ref Range   Glucose-Capillary 65 (*) 70 - 99 mg/dL  GLUCOSE, CAPILLARY     Status: Abnormal   Collection Time    02/14/14 12:19 PM      Result Value Ref Range   Glucose-Capillary 63 (*) 70 - 99 mg/dL  GLUCOSE, CAPILLARY     Status: Abnormal   Collection Time    02/14/14  1:13 PM      Result Value Ref Range   Glucose-Capillary 329 (*) 70 - 99 mg/dL  GLUCOSE, CAPILLARY     Status: Abnormal   Collection Time    02/14/14  2:58 PM      Result Value Ref Range   Glucose-Capillary 191 (*) 70 - 99 mg/dL  GLUCOSE, CAPILLARY     Status: Abnormal   Collection Time    02/14/14  4:52 PM      Result Value Ref Range   Glucose-Capillary 232 (*) 70 - 99 mg/dL  GLUCOSE, CAPILLARY     Status: Abnormal   Collection Time    02/14/14  9:37 PM      Result Value Ref Range   Glucose-Capillary 207 (*) 70 - 99 mg/dL   Comment 1 Notify RN    GLUCOSE, CAPILLARY     Status: Abnormal   Collection Time    02/15/14  7:32 AM      Result Value Ref Range   Glucose-Capillary 168 (*) 70 - 99 mg/dL  GLUCOSE, CAPILLARY     Status: None   Collection Time    02/15/14 11:27 AM      Result Value Ref Range   Glucose-Capillary 71  70 - 99 mg/dL  GLUCOSE, CAPILLARY      Status: Abnormal   Collection Time    02/15/14  4:43 PM      Result Value Ref Range   Glucose-Capillary 101 (*) 70 - 99 mg/dL   Comment 1 Notify RN    GLUCOSE, CAPILLARY     Status: Abnormal   Collection Time    02/15/14  8:58 PM      Result Value Ref Range   Glucose-Capillary 130 (*) 70 - 99 mg/dL  GLUCOSE, CAPILLARY     Status: Abnormal   Collection Time    02/16/14  7:18 AM      Result Value Ref Range   Glucose-Capillary 245 (*) 70 - 99 mg/dL   Comment 1 Notify RN    GLUCOSE, CAPILLARY     Status: Abnormal   Collection Time    02/16/14 11:15 AM      Result Value Ref Range   Glucose-Capillary 246 (*) 70 - 99 mg/dL   Comment 1  Notify RN    GLUCOSE, CAPILLARY     Status: Abnormal   Collection Time    02/16/14  4:29 PM      Result Value Ref Range   Glucose-Capillary 224 (*) 70 - 99 mg/dL  GLUCOSE, CAPILLARY     Status: Abnormal   Collection Time    02/16/14  9:12 PM      Result Value Ref Range   Glucose-Capillary 226 (*) 70 - 99 mg/dL  GLUCOSE, CAPILLARY     Status: Abnormal   Collection Time    02/17/14  7:26 AM      Result Value Ref Range   Glucose-Capillary 216 (*) 70 - 99 mg/dL     HEENT: normal Cardio: RRR and no murmur Resp: CTA B/L and unlabored GI: BS positive and mildly distended and tympanitic, mild tenderness Extremity:  No Edema Skin:  Wounds on both ankles with eschar,debris--superficial, stage 2 on sacrum. Large elbow wound with decreased fibronecrotic tissue, more granulation. Area under left knee with dry,fibronecrotic tissue Neuro: Alert/Oriented, Abnormal Sensory absent in feet and Abnormal Motor 0/5 in LE,0/5 finger extension and 0/5 hand intrinsic. Wrist extension 2-, tricep   2, biceps 5/5. Decreased sensation below elbows bilaterally Musc/Skel:  Left trap less tender. Clavicle non-tender Gen NAD   Assessment/Plan: 1. Functional deficits secondary to tetraparesis which require 3+ hours per day of interdisciplinary therapy in a comprehensive  inpatient rehab setting. Physiatrist is providing close team supervision and 24 hour management of active medical problems listed below. Physiatrist and rehab team continue to assess barriers to discharge/monitor patient progress toward functional and medical goals.       FIM: FIM - Bathing Bathing Steps Patient Completed: Chest;Abdomen Bathing:  (performed with his mother)  FIM - Upper Body Dressing/Undressing Upper body dressing/undressing steps patient completed: Thread/unthread right sleeve of pullover shirt/dresss;Thread/unthread left sleeve of pullover shirt/dress;Put head through opening of pull over shirt/dress Upper body dressing/undressing: 4: Min-Patient completed 75 plus % of tasks FIM - Lower Body Dressing/Undressing Lower body dressing/undressing: 1: Total-Patient completed less than 25% of tasks (assisted with pull)  FIM - Toileting Toileting: 1: Total-Patient completed zero steps, helper did all 3  FIM - Air cabin crew Transfers: 0-Activity did not occur  FIM - Control and instrumentation engineer Devices: Arm rests;Sliding board Bed/Chair Transfer: 1: Two helpers  FIM - Locomotion: Wheelchair Locomotion: Wheelchair: 0: Activity did not occur FIM - Locomotion: Ambulation Locomotion: Ambulation: 0: Activity did not occur  Comprehension Comprehension Mode: Auditory Comprehension: 5-Understands basic 90% of the time/requires cueing < 10% of the time  Expression Expression Mode: Verbal Expression: 5-Expresses complex 90% of the time/cues < 10% of the time  Social Interaction Social Interaction: 5-Interacts appropriately 90% of the time - Needs monitoring or encouragement for participation or interaction.  Problem Solving Problem Solving: 5-Solves basic problems: With no assist  Memory Memory: 5-Recognizes or recalls 90% of the time/requires cueing < 10% of the time  Medical Problem List and Plan:  1. 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 DVT Prophylaxis/Anticoagulation: Pharmaceutical: Lovenox  3. Chronic Pain Management: Continue MS contin 30 mg bid. Air bed for back and buttock pain as well as pressure relief measures.   -reminded him to use heating pad for shoulder/neck symptoms, good posture when in bed and chair  -trigger point inj to left trap yesterday with good results  -has clavicle fracture also in this general area with distraction laterally---denies any  pain in this area. 4. Mood: reviewed anxiety with patient. Discussed triggers and ways to de-escalate. Also have added prn xanax if he is unable to manage it.  -neuropsych assessment also requested 5. Neuropsych: This patient is capable of making decisions on his own behalf.  6. Spinal cord infarct: Initiate steroid taper and monitor for BS changes as dose decreases.--   7. DM type 2 with gastroparesis: Brittle diabetic per mother--variable intake due to GI symptoms/gastroparesis with subsequent wide fluctuations. Increase PM lantus to 30--, Increase am dose to 8u. Now on 3u novolog units tid ac for meal coverage.    -consider endocrine consult 8. ChronicBLE wounds with chronically elevated ESR: continue doxycline for 30 days (started 02/02/14). Follow up with ID after discharge. Continue local wound care.   -right elbow granulating in  -wet-dry with allevyn over right popliteal area 9. Constipation/diabetic gastroparesis/neurogenic bowel: Will resume reglan to help with constipation as well as GI symptoms . Bowel program  10. Severe diabetic peripheral neuropathy: Continue neurontin 154m bid an 4044m qhs   11. Pulmonary:  deep breathing, IS/FV, CPAP -quad cough, assistive techniques  -keep hob at 30 degrees  -chest PT 12. Low grade temp:   -see #11  -check urine today as well.   -recheck labs   LOS (Days) 12 A FACE TO FACE EVALUATION WAS PERFORMED  ZaMeredith Staggers/13/2015, 8:53 AM

## 2014-02-17 NOTE — Progress Notes (Signed)
Occupational Therapy Session Note  Patient Details  Name: Jason [name] MRN: [id number] Date of Birth: Apr 16, 1986  Today's Date: 02/17/2014 Time: 9798-9211 Time Calculation (min): 45 min  Short Term Goals: Week 2:  OT Short Term Goal 1 (Week 2): Sitting Balance:  Max assist EOB for 10 min OT Short Term Goal 2 (Week 2): UB Dressing:  Patient will pull shirt down in the back with min assist for stability OT Short Term Goal 3 (Week 2): LB dressing:  Patient will donn pants over feet in long/circle sitting with min assist OT Short Term Goal 4 (Week 2): Grooming: patient will brush teeth sitting in w/c at sink with min assist for set up PRN OT Short Term Goal 5 (Week 2): Communication: Patient will demonstrate use of hospital phone with supervision  Skilled Therapeutic Interventions/Progress Updates:  Patient resting in bed upon arrival and as agreed: breakfast and sponge completed (adult brief on) with assist from patient's mother.  Focused session on dressing with RN in room stating that she would perform dressing changes on his BLEs/feet after OT therefore TED hose were not applied.   During dressing, patient often leans on right bed rail with HOB up then when needs to come upright, uses his right elbow on the bed rail to push away.  Today, he rubbed off the bandage on his elbow and RN came in to dress it so dressing could continue.  Patient is learning to problem solve when given a challenge and appears open to suggestions.  Assisted patient to attempt long and circle sitting from starting position of [facility] as high up as it could go however, patient report painful stretch/tightness all the way up his back and neck with ~80 degrees hip flexion.  After discussion regarding the pros and cons of dressing with the assist of the leg loops, patient requested to attempt LB dressing with leg loops on. Patient asking questions about the option of a colostomy bag and I & O caths.  After short discussion,  referred patient to MD and RN for further discussion.  Therapy Documentation Precautions:  Precautions Precautions: Fall Precaution Comments: sacral breakdown-boost when in w/c, abdominal binder and TEDs due to low BP. Required Braces or Orthoses: Other Brace/Splint Other Brace/Splint: Bilateral Prevalon boots at night Restrictions Weight Bearing Restrictions: No Pain: 6/10 sacral area, rest, repositioned and medication provided. ADL: See FIM for current functional status  Therapy/Group: Individual Therapy  Gaye Pollack 02/17/2014, 4:44 PM

## 2014-02-17 NOTE — Patient Care Conference (Signed)
Inpatient RehabilitationTeam Conference and Plan of Care Update Date: 02/16/2014   Time: 2:05 PM    Patient Name: Jason [name]      Medical Record Number: [id number]  Date of Birth: Jan 20, 1986 Sex: Male         Room/Bed: 4W18C/4W18C-01 Payor Info: Payor: MEDICAID PENDING / Plan: MEDICAID PENDING / Product Type: *No Product type* /    Admitting Diagnosis: SPINAL CORD  Admit Date/Time:  02/05/2014  6:37 PM Admission Comments: No comment available   Primary Diagnosis:  <principal problem not specified> Principal Problem: <principal problem not specified>  Patient Active Problem List   Diagnosis Date Noted  . 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 Diabetic foot ulcer 02/04/2014  . Oral thrush 02/04/2014  . Leg weakness 01/30/2014  . Bilateral lower leg cellulitis 01/30/2014  . Altered mental status 01/30/2014  . 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 HCAP (healthcare-associated pneumonia) 01/29/2014  . DKA (diabetic ketoacidoses) 12/24/2013  . DM type 1 causing leg or foot ulcer 10/04/2013  . Rhinovirus infection 10/03/2013  . Protein-calorie malnutrition, severe 09/24/2013  . Aspiration pneumonia 09/23/2013  . Gastroparesis due to DM 06/02/2013  . Chronic low back pain 04/18/2013  . Painful diabetic neuropathy 03/20/2013  . Traumatic ecchymosis of toe 01/29/2013  . Tobacco abuse 01/29/2013  . Noncompliance with diet and medication regimen 01/29/2013  . VIRAL URI 09/15/2007  . GERD 09/15/2007  . Type 1 diabetes, uncontrolled, with neuropathy 05/29/2007  . ASTHMA 05/29/2007    Expected Discharge Date: Expected Discharge Date: 03/05/14  Team Members Present: Physician leading conference: Dr. Alger Simons Social Worker Present: Lennart Pall, LCSW Nurse Present: Elliot Cousin, RN PT Present: Canary Brim, PT OT Present: Blanchard Mane, OT;Jennifer Tamala Julian, OT;Patricia Lissa Hoard, OT SLP Present: Weston Anna, SLP Other (Discipline and Name): Danne Baxter, RN [facility])     Current  Status/Progress Goal Weekly Team Focus  Medical   continued lower extremity weakness. working through pain and SCI sequelae, bp's low, faituge, nausea  see prior, pain mgt, edcuation  see prior   Bowel/Bladder   inconinent of bowel, foley catheter in place , Last BM 02/15/14  To be continent of bowel and bladder  continue bowel program   Swallow/Nutrition/ Hydration             ADL's   Min UB bath and dress, max assist LB bath and sitting balance, total assist LB dress, total assist with transfers  Supervision upper body dressing, Mod A bathing & LB dressing (pants only), Mod A sitting balance. (1 LTG upgraded and 1 LTG added)  Activity tolerance, bed mobility, sitting balance, use of leg loops, guiding his care, self feeding and grooming with AE PRN   Mobility   max A bed mobility, +2 transfers  overall max A  sitting balance, education, transfers, activity tolerance   Communication             Safety/Cognition/ Behavioral Observations            Pain   Neck, L shoulder and sacral pain >3. Scheduled morphine 30 mg and PRN oxycodone 5-10 mg   Pain level 3 or less on scale of 0-10  Monitor for pain q4h.   Skin   Diabetic ulcers to bilat great toes, discolored BLE, ulcer to R elbow  No new skin breakdown  Assess skin q shift, keep skin clean and dry.    Rehab Goals Patient on target to meet rehab goals: Yes *See Care Plan and progress notes for long and  short-term goals.  Barriers to Discharge: pain mgt, see prior    Possible Resolutions to Barriers:  see prior, family ed    Discharge Planning/Teaching Needs:  home with mother and gf to provide 24/7 assistance      Team Discussion:  Infection today to old injury site.  Slightly better with bathing and dressing strength/ endurance.  OT shared video education today with pt and pt asking appropriate questions.  Elbow wound healing.  Some increase in feeding independently and mother continues to encourage his independence.  Need to  continue working on planned bowel program.  Revisions to Treatment Plan:  None   Continued Need for Acute Rehabilitation Level of Care: The patient requires daily medical management by a physician with specialized training in physical medicine and rehabilitation for the following conditions: Daily direction of a multidisciplinary physical rehabilitation program to ensure safe treatment while eliciting the highest outcome that is of practical value to the patient.: Yes Daily medical management of patient stability for increased activity during participation in an intensive rehabilitation regime.: Yes Daily analysis of laboratory values and/or radiology reports with any subsequent need for medication adjustment of medical intervention for : Neurological problems;Post surgical problems  Lennart Pall 02/17/2014, 11:15 AM

## 2014-02-17 NOTE — Significant Event (Signed)
Hypoglycemic Event  CBG: 47  Treatment: 15 GM carbohydrate snack  Symptoms: Reports feels funny  Follow-up CBG: Time:1157  CBG Result:49  Possible Reasons for Event: Unknown  Comments/MD notified:Dan [place] PA notified. No new orders received    Warnell Bureau Dmitry Macomber  Remember to initiate Hypoglycemia Order Set & complete

## 2014-02-17 NOTE — Progress Notes (Signed)
Social Work Patient ID: Judy Pimple, male   DOB: June 10, 1986, 28 y.o.   MRN: [id number]  Have reviewed team conference with pt, mother and fiance today.  All aware and agreeable with targeted d/c date of 5/29.  Long discussion with all about their primary focus/ goal to have a managed bowel program for home.  Also discussed home accessibility - will refer to [place] program prior to d/c.  All pleased with progress pt has made this week and feel they will be ready for d/c end of month.  Clarified with pt and mother again the limitations in therapy coverage under [facility] after d/c.  Mother aware she will, in essence, become pt's "therapist" at home.  I will arrange for PCS services to aide in his home care.  Continue to follow for support and d/c planning needs.  Lennart Pall, LCSW

## 2014-02-18 ENCOUNTER — Inpatient Hospital Stay (HOSPITAL_COMMUNITY): Payer: Medicaid Other | Admitting: *Deleted

## 2014-02-18 ENCOUNTER — Inpatient Hospital Stay (HOSPITAL_COMMUNITY): Payer: Medicaid Other | Admitting: Physical Therapy

## 2014-02-18 ENCOUNTER — Encounter (HOSPITAL_COMMUNITY): Payer: Self-pay | Admitting: Occupational Therapy

## 2014-02-18 DIAGNOSIS — L03119 Cellulitis of unspecified part of limb: Secondary | ICD-10-CM

## 2014-02-18 DIAGNOSIS — G8250 Quadriplegia, unspecified: Secondary | ICD-10-CM

## 2014-02-18 DIAGNOSIS — G9519 Other vascular myelopathies: Secondary | ICD-10-CM

## 2014-02-18 DIAGNOSIS — L02419 Cutaneous abscess of limb, unspecified: Secondary | ICD-10-CM

## 2014-02-18 DIAGNOSIS — E291 Testicular hypofunction: Secondary | ICD-10-CM

## 2014-02-18 DIAGNOSIS — K3184 Gastroparesis: Secondary | ICD-10-CM

## 2014-02-18 DIAGNOSIS — E1149 Type 2 diabetes mellitus with other diabetic neurological complication: Secondary | ICD-10-CM

## 2014-02-18 LAB — GLUCOSE, CAPILLARY
GLUCOSE-CAPILLARY: 116 mg/dL — AB (ref 70–99)
Glucose-Capillary: 95 mg/dL (ref 70–99)
Glucose-Capillary: 61 mg/dL — ABNORMAL LOW (ref 70–99)
Glucose-Capillary: 364 mg/dL — ABNORMAL HIGH (ref 70–99)
Glucose-Capillary: 191 mg/dL — ABNORMAL HIGH (ref 70–99)

## 2014-02-18 LAB — URINE CULTURE
Colony Count: NO GROWTH
Culture: NO GROWTH

## 2014-02-18 NOTE — Progress Notes (Signed)
Physical Therapy Note  Patient Details  Name: Jason [name] MRN: [id number] Date of Birth: 03-Sep-1986 Today's Date: 02/18/2014  Time: 1000-1100 60 minutes co tx with TR  1:1 Pt c/o shoulder soreness from yesterday's sessions but no c/o "pain".  Pt performed supine to sit with leg loops, mod A.  Sitting balance edge of bed with min-mod A for static sitting balance.  Sliding board transfers with mod A multiple attempts.  Seated balance for wii boxing and fine motor tasks.  Pt able to problem solve well, requires min- mod A for seated dynamic balance with boxing game.  Pt improving activity tolerance to upright.   [name] 02/18/2014, 10:46 AM

## 2014-02-18 NOTE — Progress Notes (Signed)
Occupational Therapy Session Note  Patient Details  Name: Jason [name] MRN: [id number] Date of Birth: 06-Jan-1986  Today's Date: 02/18/2014 Time: 0900-1000 Time Calculation (min): 60 min  Skilled Therapeutic Interventions/Progress Updates:    Pt was seen for individual OT treatment session today with focus on bed mobility, ADL's related to dressing (pt's mothers had given him a sponge bath prior to OT arrival) bed level. Pt was noted to use bilateral UE's to assist with positioning, rolling R/L in bed, UE/hand use to hold his toothbrush (he declined use of universal cuff), perform grooming tasks and drinking from cup. Pt is overall total A for LE bathing and dressing. He was noted to have a BM by OT & his mother stated "I'll take care of that", during peri care, pt was noted to have some skin breakdown on his scrotum, so his RN was notified and came into the room to assess. Barrier cream was applied and LB dressing at bed level was completed.   Therapy Documentation Precautions:  Precautions Precautions: Fall Precaution Comments: sacral breakdown-boost when in w/c, abdominal binder and TEDs due to low BP. Required Braces or Orthoses: Other Brace/Splint Other Brace/Splint: Bilateral Prevalon boots at night Restrictions Weight Bearing Restrictions: No General:   Vital Signs:   Pain: Pain Assessment Pain Assessment: 0-10 Pain Score: 4  ADL: ADL ADL Comments: refer to FIM Exercises:   Other Treatments:    See FIM for current functional status  Therapy/Group: Individual  Plumer Mittelstaedt B [name] 02/18/2014, 12:20 PM

## 2014-02-18 NOTE — Progress Notes (Signed)
Physical Therapy Session Note  Patient Details  Name: Jason [name] MRN: [id number] Date of Birth: 03/17/1986  Today's Date: 02/18/2014 Time: 1305-1340 Time Calculation (min): 35 min  Short Term Goals: Week 1:  PT Short Term Goal 1 (Week 1): Patient will perfom Bed Mobility Max-assist x 2 PT Short Term Goal 1 - Progress (Week 1): Met PT Short Term Goal 2 (Week 1): Patient will perform Transfers Max-assist x 2 PT Short Term Goal 2 - Progress (Week 1): Met PT Short Term Goal 3 (Week 1): Dietitian with Verbal Understanding PT Short Term Goal 3 - Progress (Week 1): Progressing toward goal PT Short Term Goal 4 (Week 1): Static Sitting Balance EOB with Max-assist PT Short Term Goal 4 - Progress (Week 1): Met Week 2:  PT Short Term Goal 1 (Week 2): Pt will perform functional transfers with max A of 1 PT Short Term Goal 2 (Week 2): Pt will tolerate sitting edge of bed for functional activities x 5 minutes with mod A PT Short Term Goal 3 (Week 2): pt will propel w/c with supervision x 50' in controlled environment  Skilled Therapeutic Interventions/Progress Updates:   Pt still up in w/c after lunch.  No c/o pain or dizziness and pt reporting he has been in w/c for 4 hours today.  Pt also reporting that he may have had a BM and would like to be cleaned up.  Pt verbally guided set up of w/c and sequencing of transfer; pt performed scooting to edge of w/c with mod-max A and assisted with placement of slideboard by lifting his LLE with leg loops.  Pt performed slideboard transfer w/c >bed with min-mod A overall and was able to balance EOB with CGA while therapist removed slideboard.  Performed sit > side with mod A and inspected pt brief.  Pt was clean.  Pt reporting he had a trigger point injection two days ago but would be willing to try the TENS unit for further pain management.  In sidelying TENS unit placed over R scapula and upper trap.  Wavelength set on modulated at 20.0  intensity.  Pt reporting that he is unable to sense the TENS but agreeable to keep it on for 15 minutes.  Pt placed in sidelying for pressure relief and following therapist alerted that pt had TENS unit and to assess skin and pain once removed.      Therapy Documentation Precautions:  Precautions Precautions: Fall Precaution Comments: sacral breakdown-boost when in w/c, abdominal binder and TEDs due to low BP. Required Braces or Orthoses: Other Brace/Splint Other Brace/Splint: Bilateral Prevalon boots at night Restrictions Weight Bearing Restrictions: No Vital Signs: Therapy Vitals Temp: 98.8 F (37.1 C) Temp src: Oral Pulse Rate: 102 Resp: 17 BP: 124/82 mmHg Patient Position, if appropriate: Lying Oxygen Therapy SpO2: 100 % O2 Device: None (Room air) Pain:  No c/o pain at rest in w/c   See FIM for current functional status  Therapy/Group: Individual Therapy  Malachy Mood 02/18/2014, 4:38 PM

## 2014-02-18 NOTE — Progress Notes (Signed)
Physical Therapy Session Note  Patient Details  Name: Jason [name] MRN: [id number] Date of Birth: 1985/10/26  Today's Date: 02/18/2014 Time: 1345-1430 Time Calculation (min): 45 min  Short Term Goals: Week 2:  PT Short Term Goal 1 (Week 2): Pt will perform functional transfers with max A of 1 PT Short Term Goal 2 (Week 2): Pt will tolerate sitting edge of bed for functional activities x 5 minutes with mod A PT Short Term Goal 3 (Week 2): pt will propel w/c with supervision x 50' in controlled environment  Skilled Therapeutic Interventions/Progress Updates:   Pt received sidelying in bed with TENS unit on R shoulder following previous PT session. Pt agreeable to therapy at bed level due to returning to bed with previous PT after sitting up in w/c for 4 hours today. In preparation for improved participation with LB dressing, therapist explained importance of hamstring flexibility for long sitting and circle sitting, provided demonstration of circle sitting while propped on UEs. Supine hamstring stretch 2 x 60 sec each LE. Therapist educated patient's mom in technique and stretching to patient's pain tolerance after mom reported she stopped helping him stretch due to fear of injuring something. Pt assisted with moving LEs using leg loops into circle sitting with HOB raised. Pt assisted into circle sitting with HOB raised initially and max-total A, lowered HOB and with close supervision patient able to maintain sitting balance with BUE support x 45 sec and x 60 sec, followed by long sitting with patient able to maintain sitting balance with BUE x 2 min. Patient requires prolonged rest breaks between sitting trials due to fatigue/shoulder pain. TENS unit removed with patient reporting no difference in pain/unable to sense TENS unit and skin intact. Patient repositioned in bed, rolling R/L min A with use of rails, with prevalon boots donned and HOB raised in order to finish eating lunch, mom present.    Therapy Documentation Precautions:  Precautions Precautions: Fall Precaution Comments: sacral breakdown-boost when in w/c, abdominal binder and TEDs due to low BP. Required Braces or Orthoses: Other Brace/Splint Other Brace/Splint: Bilateral Prevalon boots at night Restrictions Weight Bearing Restrictions: No  See FIM for current functional status  Therapy/Group: Individual Therapy  Laretta Alstrom 02/18/2014, 4:46 PM

## 2014-02-18 NOTE — Progress Notes (Signed)
Subjective/Complaints:   Concerned about numbness feeling in Left forearm, no change in strength A 12 point review of systems has been performed and if not noted above is otherwise negative.   Objective: Vital Signs: Blood pressure 124/81, pulse 93, temperature 98 F (36.7 C), temperature source Oral, resp. rate 20, weight 58.9 kg (129 lb 13.6 oz), SpO2 100.00%. Dg Chest Port 1 View  02/17/2014   CLINICAL DATA:  Low grade temperature.  Cough.  EXAM: PORTABLE CHEST - 1 VIEW  COMPARISON:  02/05/2014  FINDINGS: Cardiac silhouette is normal in size and configuration. No mediastinal or hilar masses. No evidence of adenopathy.  Lungs are clear.  No pleural effusion.  No pneumothorax.  Bony thorax demonstrates a chronic old fracture of the left distal clavicle and stable widening of the right clavicle. The thorax is otherwise unremarkable.  IMPRESSION: No acute cardiopulmonary disease.   Electronically Signed   By: Lajean Manes M.D.   On: 02/17/2014 11:07   Results for orders placed during the hospital encounter of 02/05/14 (from the past 72 hour(s))  GLUCOSE, CAPILLARY     Status: Abnormal   Collection Time    02/15/14  7:32 AM      Result Value Ref Range   Glucose-Capillary 168 (*) 70 - 99 mg/dL  GLUCOSE, CAPILLARY     Status: None   Collection Time    02/15/14 11:27 AM      Result Value Ref Range   Glucose-Capillary 71  70 - 99 mg/dL  GLUCOSE, CAPILLARY     Status: Abnormal   Collection Time    02/15/14  4:43 PM      Result Value Ref Range   Glucose-Capillary 101 (*) 70 - 99 mg/dL   Comment 1 Notify RN    GLUCOSE, CAPILLARY     Status: Abnormal   Collection Time    02/15/14  8:58 PM      Result Value Ref Range   Glucose-Capillary 130 (*) 70 - 99 mg/dL  GLUCOSE, CAPILLARY     Status: Abnormal   Collection Time    02/16/14  7:18 AM      Result Value Ref Range   Glucose-Capillary 245 (*) 70 - 99 mg/dL   Comment 1 Notify RN    GLUCOSE, CAPILLARY     Status: Abnormal   Collection Time    02/16/14 11:15 AM      Result Value Ref Range   Glucose-Capillary 246 (*) 70 - 99 mg/dL   Comment 1 Notify RN    GLUCOSE, CAPILLARY     Status: Abnormal   Collection Time    02/16/14  4:29 PM      Result Value Ref Range   Glucose-Capillary 224 (*) 70 - 99 mg/dL  GLUCOSE, CAPILLARY     Status: Abnormal   Collection Time    02/16/14  9:12 PM      Result Value Ref Range   Glucose-Capillary 226 (*) 70 - 99 mg/dL  GLUCOSE, CAPILLARY     Status: Abnormal   Collection Time    02/17/14  7:26 AM      Result Value Ref Range   Glucose-Capillary 216 (*) 70 - 99 mg/dL  CBC     Status: Abnormal   Collection Time    02/17/14  9:50 AM      Result Value Ref Range   WBC 13.9 (*) 4.0 - 10.5 K/uL   RBC 2.94 (*) 4.22 - 5.81 MIL/uL   Hemoglobin 7.5 (*) 13.0 -  17.0 g/dL   HCT 23.9 (*) 39.0 - 52.0 %   MCV 81.3  78.0 - 100.0 fL   MCH 25.5 (*) 26.0 - 34.0 pg   MCHC 31.4  30.0 - 36.0 g/dL   RDW 16.5 (*) 11.5 - 15.5 %   Platelets 432 (*) 150 - 400 K/uL  BASIC METABOLIC PANEL     Status: Abnormal   Collection Time    02/17/14  9:50 AM      Result Value Ref Range   Sodium 141  137 - 147 mEq/L   Potassium 4.3  3.7 - 5.3 mEq/L   Chloride 105  96 - 112 mEq/L   CO2 27  19 - 32 mEq/L   Glucose, Bld 121 (*) 70 - 99 mg/dL   BUN 29 (*) 6 - 23 mg/dL   Creatinine, Ser 0.88  0.50 - 1.35 mg/dL   Calcium 9.0  8.4 - 10.5 mg/dL   GFR calc non Af Amer >90  >90 mL/min   GFR calc Af Amer >90  >90 mL/min   Comment: (NOTE)     The eGFR has been calculated using the CKD EPI equation.     This calculation has not been validated in all clinical situations.     eGFR's persistently <90 mL/min signify possible Chronic Kidney     Disease.  GLUCOSE, CAPILLARY     Status: Abnormal   Collection Time    02/17/14 11:36 AM      Result Value Ref Range   Glucose-Capillary 47 (*) 70 - 99 mg/dL  GLUCOSE, CAPILLARY     Status: Abnormal   Collection Time    02/17/14 11:57 AM      Result Value Ref Range    Glucose-Capillary 49 (*) 70 - 99 mg/dL  GLUCOSE, CAPILLARY     Status: None   Collection Time    02/17/14 12:15 PM      Result Value Ref Range   Glucose-Capillary 74  70 - 99 mg/dL  URINALYSIS, ROUTINE W REFLEX MICROSCOPIC     Status: Abnormal   Collection Time    02/17/14  2:31 PM      Result Value Ref Range   Color, Urine YELLOW  YELLOW   APPearance CLEAR  CLEAR   Specific Gravity, Urine 1.020  1.005 - 1.030   pH 5.5  5.0 - 8.0   Glucose, UA 100 (*) NEGATIVE mg/dL   Hgb urine dipstick MODERATE (*) NEGATIVE   Bilirubin Urine NEGATIVE  NEGATIVE   Ketones, ur NEGATIVE  NEGATIVE mg/dL   Protein, ur 100 (*) NEGATIVE mg/dL   Urobilinogen, UA 0.2  0.0 - 1.0 mg/dL   Nitrite NEGATIVE  NEGATIVE   Leukocytes, UA NEGATIVE  NEGATIVE  URINE MICROSCOPIC-ADD ON     Status: Abnormal   Collection Time    02/17/14  2:31 PM      Result Value Ref Range   Squamous Epithelial / LPF RARE  RARE   WBC, UA 0-2  <3 WBC/hpf   RBC / HPF 3-6  <3 RBC/hpf   Bacteria, UA FEW (*) RARE   Casts HYALINE CASTS (*) NEGATIVE   Urine-Other RARE YEAST    GLUCOSE, CAPILLARY     Status: Abnormal   Collection Time    02/17/14  4:55 PM      Result Value Ref Range   Glucose-Capillary 362 (*) 70 - 99 mg/dL  GLUCOSE, CAPILLARY     Status: Abnormal   Collection Time    02/17/14  8:54 PM      Result Value Ref Range   Glucose-Capillary 235 (*) 70 - 99 mg/dL  GLUCOSE, CAPILLARY     Status: None   Collection Time    02/18/14  7:19 AM      Result Value Ref Range   Glucose-Capillary 95  70 - 99 mg/dL   Comment 1 Notify RN       HEENT: normal Cardio: RRR and no murmur Resp: CTA B/L and unlabored GI: BS positive and mildly distended and tympanitic, mild tenderness Extremity:  No Edema Skin:  Wounds on both ankles with eschar,debris--superficial, stage 2 on sacrum. Large elbow wound with decreased fibronecrotic tissue, more granulation. Area under left knee with dry,fibronecrotic tissue Neuro: Alert/Oriented, Abnormal  Sensory absent in feet and Abnormal Motor 0/5 in LE,0/5 finger extension and 0/5 hand intrinsic. Wrist extension 2-, 3/5 tricep   2, biceps 5/5. Decreased sensation below elbows bilaterally but able to ID LT in Left C6-7 dermatomes Musc/Skel:  Left trap less tender. Clavicle non-tender Gen NAD   Assessment/Plan: 1. 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 Physiatrist is providing close team supervision and 24 hour management of active medical problems listed below. Physiatrist and rehab team continue to assess barriers to discharge/monitor patient progress toward functional and medical goals.   FIM: FIM - Bathing Bathing Steps Patient Completed: Chest;Abdomen Bathing:  (performed with his mother)  FIM - Upper Body Dressing/Undressing Upper body dressing/undressing steps patient completed: Thread/unthread right sleeve of pullover shirt/dresss;Thread/unthread left sleeve of pullover shirt/dress;Put head through opening of pull over shirt/dress Upper body dressing/undressing: 4: Min-Patient completed 75 plus % of tasks FIM - Lower Body Dressing/Undressing Lower body dressing/undressing: 1: Total-Patient completed less than 25% of tasks (assisted with donn pants w/ HOB up & pants to waist w/ roll)  FIM - Toileting Toileting: 0: Activity did not occur  FIM - Air cabin crew Transfers: 0-Activity did not occur  FIM - Control and instrumentation engineer Devices: Arm rests;Sliding board Bed/Chair Transfer: 2: Sit > Supine: Max A (lifting assist/Pt. 25-49%);1: Two helpers  FIM - Locomotion: Wheelchair Distance: 50 Locomotion: Wheelchair: 2: Travels 65 - 149 ft with minimal assistance (Pt.>75%) FIM - Locomotion: Ambulation Locomotion: Ambulation: 0: Activity did not occur  Comprehension Comprehension Mode: Auditory Comprehension: 5-Understands complex 90% of  the time/Cues < 10% of the time  Expression Expression Mode: Verbal Expression: 5-Expresses complex 90% of the time/cues < 10% of the time  Social Interaction Social Interaction: 6-Interacts appropriately with others with medication or extra time (anti-anxiety, antidepressant).  Problem Solving Problem Solving: 5-Solves basic problems: With no assist  Memory Memory: 5-Recognizes or recalls 90% of the time/requires cueing < 10% of the time  Medical Problem List and Plan:  1. 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 DVT Prophylaxis/Anticoagulation: Pharmaceutical: Lovenox  3. Chronic Pain Management: Continue MS contin 30 mg bid. Air bed for back and buttock pain as well as pressure relief measures.   Has non painful sensory parasthesias in Zone of Partial Preservation  LUE   -reminded him to use heating pad for shoulder/neck symptoms, good posture when in bed and chair  -trigger point inj to left trap yesterday with good results  -has clavicle fracture also in this general area with distraction laterally---denies any pain in this area. 4. Mood: reviewed anxiety with patient. Discussed triggers and ways to de-escalate.  Also have added prn xanax if he is unable to manage it.  -neuropsych assessment also requested 5. Neuropsych: This patient is capable of making decisions on his own behalf.  6. Spinal cord infarct: Initiate steroid taper and monitor for BS changes as dose decreases.--   7. DM type 2 with gastroparesis: Brittle diabetic per mother--variable intake due to GI symptoms/gastroparesis with subsequent wide fluctuations. Increase PM lantus to 30--, Increase am dose to 8u. Now on 3u novolog units tid ac for meal coverage.    -consider endocrine consult 8. ChronicBLE wounds with chronically elevated ESR: continue doxycline for 30 days (started 02/02/14). Follow up with ID after discharge. Continue local wound  care.   -right elbow granulating in  -wet-dry with allevyn over right popliteal area 9. Constipation/diabetic gastroparesis/neurogenic bowel: Will resume reglan to help with constipation as well as GI symptoms . Bowel program  10. Severe diabetic peripheral neuropathy: Continue neurontin 180m bid an 4067m qhs   11. Pulmonary:  deep breathing, IS/FV, CPAP -quad cough, assistive techniques  -keep hob at 30 degrees  -chest PT 12. Low grade temp:   -labs ok , likely atelectasis   LOS (Days) 13 A FACE TO FACE EVALUATION WAS PERFORMED  AnCharlett Blake/14/2015, 7:27 AM

## 2014-02-18 NOTE — Progress Notes (Signed)
Recreational Therapy Session Note  Patient Details  Name: BALDO HUFNAGLE MRN: [id number] Date of Birth: 11-05-1985 Today's Date: 02/18/2014  Pain: c/o BUE "soreness from pushing w/c yesterday", no c/o pain otherwise Skilled Therapeutic Interventions/Progress Updates: Session focused on activity tolerance, slide board transfers, dynamic sitting balance, BUE use-gross motor & active assist.  Pt performed slide board transfers with mod assist once board was placed w/c<-->mat.  Pt sat supported EOM for Wii Boxing.  Pt able to hold controller in left hand but required ace wrapping to assist with grasp of controller in RUE.  Pt independently problem solved through "technical difficulty" with the Wii console & TV.  Therapy/Group: Co-Treatment   Waldon Reining 02/18/2014, 12:06 PM

## 2014-02-19 ENCOUNTER — Inpatient Hospital Stay (HOSPITAL_COMMUNITY): Payer: Self-pay | Admitting: Occupational Therapy

## 2014-02-19 ENCOUNTER — Inpatient Hospital Stay (HOSPITAL_COMMUNITY): Payer: Medicaid Other | Admitting: Physical Therapy

## 2014-02-19 ENCOUNTER — Inpatient Hospital Stay (HOSPITAL_COMMUNITY): Payer: Medicaid Other | Admitting: Occupational Therapy

## 2014-02-19 DIAGNOSIS — G9519 Other vascular myelopathies: Secondary | ICD-10-CM

## 2014-02-19 DIAGNOSIS — L03119 Cellulitis of unspecified part of limb: Secondary | ICD-10-CM

## 2014-02-19 DIAGNOSIS — G8250 Quadriplegia, unspecified: Secondary | ICD-10-CM

## 2014-02-19 DIAGNOSIS — E291 Testicular hypofunction: Secondary | ICD-10-CM

## 2014-02-19 DIAGNOSIS — K3184 Gastroparesis: Secondary | ICD-10-CM

## 2014-02-19 DIAGNOSIS — L02419 Cutaneous abscess of limb, unspecified: Secondary | ICD-10-CM

## 2014-02-19 DIAGNOSIS — E1149 Type 2 diabetes mellitus with other diabetic neurological complication: Secondary | ICD-10-CM

## 2014-02-19 LAB — GLUCOSE, CAPILLARY
GLUCOSE-CAPILLARY: 277 mg/dL — AB (ref 70–99)
Glucose-Capillary: 129 mg/dL — ABNORMAL HIGH (ref 70–99)
Glucose-Capillary: 203 mg/dL — ABNORMAL HIGH (ref 70–99)
Glucose-Capillary: 234 mg/dL — ABNORMAL HIGH (ref 70–99)

## 2014-02-19 MED ORDER — METHOCARBAMOL 500 MG PO TABS
500.0000 mg | ORAL_TABLET | Freq: Four times a day (QID) | ORAL | Status: DC | PRN
Start: 2014-02-19 — End: 2014-03-03
  Administered 2014-02-19 – 2014-03-03 (×13): 500 mg via ORAL
  Filled 2014-02-19 (×13): qty 1

## 2014-02-19 NOTE — Progress Notes (Signed)
Occupational Therapy Session Note  Patient Details  Name: Jason [name] MRN: [id number] Date of Birth: 15-May-1986  Today's Date: 02/19/2014 Time: 0900-1000 Time Calculation (min): 60 min  Short Term Goals: Week 2:  OT Short Term Goal 1 (Week 2): Sitting Balance:  Max assist EOB for 10 min OT Short Term Goal 2 (Week 2): UB Dressing:  Patient will pull shirt down in the back with min assist for stability OT Short Term Goal 3 (Week 2): LB dressing:  Patient will donn pants over feet in long/circle sitting with min assist OT Short Term Goal 4 (Week 2): Grooming: patient will brush teeth sitting in w/c at sink with min assist for set up PRN OT Short Term Goal 5 (Week 2): Communication: Patient will demonstrate use of hospital phone with supervision  Skilled Therapeutic Interventions/Progress Updates:  Patient resting in bed upon arrival with his mother at his side and they had already completed breakfast, sponge bath and his shorts were on.  Patient reporting increased pain in upper shoulders and neck before session with OT began.  Focus of session was to complete dressing, slide board transfer to bed and perform oral hygieneAfter patient donned shirt through arms and over head, patient's mother assisted to hook her forearm so he could lean forward into long sitting to pull shirt down in the back.  Patient describing significant shoulder and neck pain and requested to lay back down.  This OT offered to perform som deep tissue massage and mother stated, "I gave him a massage earlier".  Despite pain, patient attempted to donn upper portion of leg loop and experienced 10/10 pain.  Upon palpation of left shoulder, trigger point noted in upper trap and tender points in neck, middle traps and rhomboid area.  After education regarding trigger points, deep tissue/trigger point intervention provided with minimal relief.  Patient's right upper traps also tight and some deep tissue massage provided.  Patient was  unable to tolerate deep tissue technique therefore full benefit not yet felt.    Therapy Documentation Precautions:  Precautions Precautions: Fall Precaution Comments: sacral breakdown-boost when in w/c, abdominal binder and TEDs due to low BP. Required Braces or Orthoses: Other Brace/Splint Other Brace/Splint: Bilateral Prevalon boots at night Restrictions Weight Bearing Restrictions: No Pain: 8/10 shoulders and neck after LB dressing with mother before OT arrival, 10/10 with activity with OT. Rest repositioned and premedicated. ADL: See FIM for current functional status  Therapy/Group: Individual Therapy  Gaye Pollack 02/19/2014, 1:58 PM

## 2014-02-19 NOTE — Progress Notes (Signed)
Occupational Therapy Session Note  Patient Details  Name: Jason [name] MRN: [id number] Date of Birth: [birth year]  Today's Date: 02/19/2014 Time: 6546-5035 Time Calculation (min): 47 min   Skilled Therapeutic Interventions/Progress Updates:    Applied moist heat to pts posterior neck and upper shoulders to begin session.  Maintained heat for 15 mins without any adverse reactions.  Once finished therapist worked on myofascial release techniques for pts left middle and upper trap.  Utilized pressure over the muscle bundle as well as J stroking technique.  Pt voiced slight discomfort however pain meds also brought in by nursing during session.    Therapy Documentation Precautions:  Precautions Precautions: Fall Precaution Comments: sacral breakdown-boost when in w/c, abdominal binder and TEDs due to low BP. Required Braces or Orthoses: Other Brace/Splint Other Brace/Splint: Bilateral Prevalon boots at night Restrictions Weight Bearing Restrictions: No  Pain: Pain Assessment Pain Assessment: Faces Pain Score: 7  Faces Pain Scale: Hurts little more Pain Type: Acute pain Pain Location: Neck Pain Orientation: Mid Pain Descriptors / Indicators: Aching;Spasm;Sore Pain Onset: On-going Patients Stated Pain Goal: 7 Pain Intervention(s): Heat applied Multiple Pain Sites: No  See FIM for current functional status  Therapy/Group: Individual Therapy  Cindra Presume OTR/L 02/19/2014, 4:25 PM

## 2014-02-19 NOTE — Progress Notes (Signed)
Physical Therapy Session Note  Patient Details  Name: Jason [name] MRN: [id number] Date of Birth: 09-24-1986  Today's Date: 02/19/2014 Time: 6629-4765 Time Calculation (min): 47 min  Short Term Goals: Week 2:  PT Short Term Goal 1 (Week 2): Pt will perform functional transfers with max A of 1 PT Short Term Goal 2 (Week 2): Pt will tolerate sitting edge of bed for functional activities x 5 minutes with mod A PT Short Term Goal 3 (Week 2): pt will propel w/c with supervision x 50' in controlled environment  Skilled Therapeutic Interventions/Progress Updates:    Pt received R side lying in bed finished OT session; agreeable to physical therapy. Mother present. Pt reporting fatigue but no pain at this time. Session focused on functional transfers, functional management of LE's to facilitate independence with LB dressing. With [facility] elevated to 45 degrees, pt utilized leg lifters and bed rail to transition from supported long sit position > supported ring sit position (bilat hip external rotation with hip/knee flexion and plantar aspect of feet touching). During initial trial, pt required mod A to manage RLE, min A to manage LLE; performed second trial with supervision, increased time to manage bilat LE's. Maintained position x8 minutes total (with back supported by bed) to increase extensibility of hip adductors, improve functional balance. Performed semi-reclined>sit with mod A, increased time, HOB elevated (45 degrees) using bed rail. Sitting EOB for <10 seconds prior to posterior LOB requiring max A to recover secondary to compliance of air-fluidized bed. Repositioned pt into semi-reclined with max A. Ended session at this time due to increased pt fatigue. Therapist departed with pt semi-reclined in bed with 4 bed rails up, bed alarm on, mother present, and all needs within reach.  Therapy Documentation Precautions:  Precautions Precautions: Fall Precaution Comments: sacral breakdown-boost when  in w/c, abdominal binder and TEDs due to low BP. Required Braces or Orthoses: Other Brace/Splint Other Brace/Splint: Bilateral Prevalon boots at night Restrictions Weight Bearing Restrictions: No General: Amount of Missed PT Time (min): 13 Minutes Missed Time Reason: Patient fatigue Vital Signs: Therapy Vitals Temp: 99.8 F (37.7 C) Temp src: Oral Pulse Rate: 97 Resp: 18 BP: 105/71 mmHg Patient Position (if appropriate): Lying Oxygen Therapy SpO2: 94 % O2 Device: None (Room air) Pain: Pain Assessment Pain Assessment: No/denies pain  See FIM for current functional status  Therapy/Group: Individual Therapy  Malva Cogan Edem Tiegs 02/19/2014, 4:32 PM

## 2014-02-19 NOTE — Progress Notes (Addendum)
Subjective/Complaints:  No new issues except excessive BMs last noc Neck pain severeat times Neuropathy pain controlled  A 12 point review of systems has been performed and if not noted above is otherwise negative.   Objective: Vital Signs: Blood pressure 128/79, pulse 95, temperature 98.3 F (36.8 C), temperature source Oral, resp. rate 18, weight 58.3 kg (128 lb 8.5 oz), SpO2 93.00%. Dg Chest Port 1 View  02/17/2014   CLINICAL DATA:  Low grade temperature.  Cough.  EXAM: PORTABLE CHEST - 1 VIEW  COMPARISON:  02/05/2014  FINDINGS: Cardiac silhouette is normal in size and configuration. No mediastinal or hilar masses. No evidence of adenopathy.  Lungs are clear.  No pleural effusion.  No pneumothorax.  Bony thorax demonstrates a chronic old fracture of the left distal clavicle and stable widening of the right clavicle. The thorax is otherwise unremarkable.  IMPRESSION: No acute cardiopulmonary disease.   Electronically Signed   By: Lajean Manes M.D.   On: 02/17/2014 11:07   Results for orders placed during the hospital encounter of 02/05/14 (from the past 72 hour(s))  GLUCOSE, CAPILLARY     Status: Abnormal   Collection Time    02/16/14 11:15 AM      Result Value Ref Range   Glucose-Capillary 246 (*) 70 - 99 mg/dL   Comment 1 Notify RN    GLUCOSE, CAPILLARY     Status: Abnormal   Collection Time    02/16/14  4:29 PM      Result Value Ref Range   Glucose-Capillary 224 (*) 70 - 99 mg/dL  GLUCOSE, CAPILLARY     Status: Abnormal   Collection Time    02/16/14  9:12 PM      Result Value Ref Range   Glucose-Capillary 226 (*) 70 - 99 mg/dL  GLUCOSE, CAPILLARY     Status: Abnormal   Collection Time    02/17/14  7:26 AM      Result Value Ref Range   Glucose-Capillary 216 (*) 70 - 99 mg/dL  CBC     Status: Abnormal   Collection Time    02/17/14  9:50 AM      Result Value Ref Range   WBC 13.9 (*) 4.0 - 10.5 K/uL   RBC 2.94 (*) 4.22 - 5.81 MIL/uL   Hemoglobin 7.5 (*) 13.0 - 17.0  g/dL   HCT 23.9 (*) 39.0 - 52.0 %   MCV 81.3  78.0 - 100.0 fL   MCH 25.5 (*) 26.0 - 34.0 pg   MCHC 31.4  30.0 - 36.0 g/dL   RDW 16.5 (*) 11.5 - 15.5 %   Platelets 432 (*) 150 - 400 K/uL  BASIC METABOLIC PANEL     Status: Abnormal   Collection Time    02/17/14  9:50 AM      Result Value Ref Range   Sodium 141  137 - 147 mEq/L   Potassium 4.3  3.7 - 5.3 mEq/L   Chloride 105  96 - 112 mEq/L   CO2 27  19 - 32 mEq/L   Glucose, Bld 121 (*) 70 - 99 mg/dL   BUN 29 (*) 6 - 23 mg/dL   Creatinine, Ser 0.88  0.50 - 1.35 mg/dL   Calcium 9.0  8.4 - 10.5 mg/dL   GFR calc non Af Amer >90  >90 mL/min   GFR calc Af Amer >90  >90 mL/min   Comment: (NOTE)     The eGFR has been calculated using the CKD EPI equation.  This calculation has not been validated in all clinical situations.     eGFR's persistently <90 mL/min signify possible Chronic Kidney     Disease.  GLUCOSE, CAPILLARY     Status: Abnormal   Collection Time    02/17/14 11:36 AM      Result Value Ref Range   Glucose-Capillary 47 (*) 70 - 99 mg/dL  GLUCOSE, CAPILLARY     Status: Abnormal   Collection Time    02/17/14 11:57 AM      Result Value Ref Range   Glucose-Capillary 49 (*) 70 - 99 mg/dL  GLUCOSE, CAPILLARY     Status: None   Collection Time    02/17/14 12:15 PM      Result Value Ref Range   Glucose-Capillary 74  70 - 99 mg/dL  URINE CULTURE     Status: None   Collection Time    02/17/14  2:31 PM      Result Value Ref Range   Specimen Description URINE, CATHETERIZED     Special Requests NONE     Culture  Setup Time       Value: 02/17/2014 19:11     Performed at [facility]       Value: NO GROWTH     Performed at [facility]   Culture       Value: NO GROWTH     Performed at [facility]   Report Status 02/18/2014 FINAL    URINALYSIS, ROUTINE W REFLEX MICROSCOPIC     Status: Abnormal   Collection Time    02/17/14  2:31 PM      Result Value Ref Range   Color, Urine  YELLOW  YELLOW   APPearance CLEAR  CLEAR   Specific Gravity, Urine 1.020  1.005 - 1.030   pH 5.5  5.0 - 8.0   Glucose, UA 100 (*) NEGATIVE mg/dL   Hgb urine dipstick MODERATE (*) NEGATIVE   Bilirubin Urine NEGATIVE  NEGATIVE   Ketones, ur NEGATIVE  NEGATIVE mg/dL   Protein, ur 100 (*) NEGATIVE mg/dL   Urobilinogen, UA 0.2  0.0 - 1.0 mg/dL   Nitrite NEGATIVE  NEGATIVE   Leukocytes, UA NEGATIVE  NEGATIVE  URINE MICROSCOPIC-ADD ON     Status: Abnormal   Collection Time    02/17/14  2:31 PM      Result Value Ref Range   Squamous Epithelial / LPF RARE  RARE   WBC, UA 0-2  <3 WBC/hpf   RBC / HPF 3-6  <3 RBC/hpf   Bacteria, UA FEW (*) RARE   Casts HYALINE CASTS (*) NEGATIVE   Urine-Other RARE YEAST    GLUCOSE, CAPILLARY     Status: Abnormal   Collection Time    02/17/14  4:55 PM      Result Value Ref Range   Glucose-Capillary 362 (*) 70 - 99 mg/dL  GLUCOSE, CAPILLARY     Status: Abnormal   Collection Time    02/17/14  8:54 PM      Result Value Ref Range   Glucose-Capillary 235 (*) 70 - 99 mg/dL  GLUCOSE, CAPILLARY     Status: None   Collection Time    02/18/14  7:19 AM      Result Value Ref Range   Glucose-Capillary 95  70 - 99 mg/dL   Comment 1 Notify RN    GLUCOSE, CAPILLARY     Status: Abnormal   Collection Time    02/18/14 11:13  AM      Result Value Ref Range   Glucose-Capillary 61 (*) 70 - 99 mg/dL   Comment 1 Notify RN    GLUCOSE, CAPILLARY     Status: Abnormal   Collection Time    02/18/14 11:44 AM      Result Value Ref Range   Glucose-Capillary 116 (*) 70 - 99 mg/dL   Comment 1 Notify RN    GLUCOSE, CAPILLARY     Status: Abnormal   Collection Time    02/18/14  4:25 PM      Result Value Ref Range   Glucose-Capillary 364 (*) 70 - 99 mg/dL   Comment 1 Notify RN    GLUCOSE, CAPILLARY     Status: Abnormal   Collection Time    02/18/14  8:51 PM      Result Value Ref Range   Glucose-Capillary 191 (*) 70 - 99 mg/dL  GLUCOSE, CAPILLARY     Status: Abnormal    Collection Time    02/19/14  7:05 AM      Result Value Ref Range   Glucose-Capillary 277 (*) 70 - 99 mg/dL   Comment 1 Notify RN       HEENT: normal Cardio: RRR and no murmur Resp: CTA B/L and unlabored GI: BS positive and mildly distended and tympanitic, mild tenderness Extremity:  No Edema Skin:  . Neuro: Alert/Oriented, Abnormal Sensory absent in feet and Abnormal Motor 0/5 in LE,0/5 finger extension and 0/5 hand intrinsic. Wrist extension 2-, 3/5 tricep   2, biceps 5/5. Decreased sensation below elbows bilaterally but able to ID LT in Left C6-7 dermatomes Musc/Skel:  Left trap less tender. Clavicle non-tender Gen NAD   Assessment/Plan: 1. 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 Physiatrist is providing close team supervision and 24 hour management of active medical problems listed below. Physiatrist and rehab team continue to assess barriers to discharge/monitor patient progress toward functional and medical goals.   FIM: FIM - Bathing Bathing Steps Patient Completed: Chest;Abdomen Bathing:  (performed with his mother)  FIM - Upper Body Dressing/Undressing Upper body dressing/undressing steps patient completed: Thread/unthread right sleeve of pullover shirt/dresss;Thread/unthread left sleeve of pullover shirt/dress;Put head through opening of pull over shirt/dress Upper body dressing/undressing: 4: Min-Patient completed 75 plus % of tasks FIM - Lower Body Dressing/Undressing Lower body dressing/undressing steps patient completed: Thread/unthread right pants leg;Thread/unthread left pants leg;Pull pants up/down;Don/Doff right sock;Don/Doff left sock Lower body dressing/undressing: 1: Total-Patient completed less than 25% of tasks  FIM - Toileting Toileting: 0: Activity did not occur  FIM - Air cabin crew Transfers: 0-Activity did not occur  FIM -  Control and instrumentation engineer Devices: Arm rests;Sliding board Bed/Chair Transfer: 0: Activity did not occur  FIM - Locomotion: Wheelchair Distance: 50 Locomotion: Wheelchair: 0: Activity did not occur FIM - Locomotion: Ambulation Locomotion: Ambulation: 0: Activity did not occur  Comprehension Comprehension Mode: Auditory Comprehension: 6-Follows complex conversation/direction: With extra time/assistive device  Expression Expression Mode: Verbal Expression: 6-Expresses complex ideas: With extra time/assistive device  Social Interaction Social Interaction: 6-Interacts appropriately with others with medication or extra time (anti-anxiety, antidepressant).  Problem Solving Problem Solving: 5-Solves basic problems: With no assist  Memory Memory: 6-More than reasonable amt of time  Medical Problem List and Plan:  1. 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 DVT Prophylaxis/Anticoagulation: Pharmaceutical: Lovenox  3. Chronic Pain Management:  Continue MS contin 30 mg bid. Air bed for back and buttock pain as well as pressure relief measures.   Has non painful sensory parasthesias in Zone of Partial Preservation  LUE   -reminded him to use heating pad for shoulder/neck symptoms, good posture when in bed and chair  -trigger point inj to left trap yesterday with partial results, add methocarbamol  -has clavicle fracture also in this general area with distraction laterally---denies any pain in this area. 4. Mood: reviewed anxiety with patient. Discussed triggers and ways to de-escalate. Also have added prn xanax if he is unable to manage it.  -neuropsych assessment also requested 5. Neuropsych: This patient is capable of making decisions on his own behalf.  6. Spinal cord infarct: Initiate steroid taper and monitor for BS changes as dose decreases.--   7. DM type 2 with gastroparesis: Brittle  diabetic per mother--variable intake due to GI symptoms/gastroparesis with subsequent wide fluctuations. Increase PM lantus to 30--, Increase am dose to 8u. Now on 3u novolog units tid ac for meal coverage.    -consider endocrine consult 8. ChronicBLE wounds with chronically elevated ESR: continue doxycline for 30 days (started 02/02/14). Follow up with ID after discharge. Continue local wound care.   -right elbow granulating in  -wet-dry with allevyn over right popliteal area 9. Constipation/diabetic gastroparesis/neurogenic bowel: Will resume reglan to help with constipation as well as GI symptoms . Bowel program - D/C miralx, cont colace and bisacodyl supp 10. Severe diabetic peripheral neuropathy: Continue neurontin 120m bid an 4077m qhs   11. Pulmonary:  deep breathing, IS/FV, CPAP -quad cough, assistive techniques  -keep hob at 30 degrees  -chest PT 12. Low grade temp:   -labs ok , likely atelectasis   LOS (Days) 14 A FACE TO FACE EVALUATION WAS PERFORMED  AnCharlett Blake/15/2015, 7:26 AM

## 2014-02-19 NOTE — Progress Notes (Signed)
Physical Therapy Note  Patient Details  Name: MACON SANDIFORD MRN: [id number] Date of Birth: 04/23/1986 Today's Date: 02/19/2014  Time: 1010-1106 56 minutes  1:1 Pt c/o 10/10 pain in B shoulders, RN aware, pain meds rec'd during session.  Pt willing to attempt to work through pain.  Rolling and supine <> sit with leg loops, rails and pt more independent with using bed controls, pt able to perform with min A.  Sliding board transfers with min-mod A depending on pain and fatigue, pt improving sequencing for sliding board use without cues.  Seated edge of mat attempt ball squeezes and lifts with beach ball for UE and core strengthening, pt unable to tolerate due to shoulder pain.  Practice on pt using core mm to initiate forward lean from semi reclined position on mat, pt able to initiate x 5 before fatigue, unable to tolerate after 15 minutes on mat.  Pt returned to room with mod A for sliding board transfers and +2 assist for bed mobility due to pain and fatigue.  Pt positioned comfortably in bed with family present and needs at hand.   Danae Orleans Summerlyn Fickel 02/19/2014, 11:06 AM

## 2014-02-20 ENCOUNTER — Inpatient Hospital Stay (HOSPITAL_COMMUNITY): Payer: Self-pay | Admitting: Occupational Therapy

## 2014-02-20 ENCOUNTER — Inpatient Hospital Stay (HOSPITAL_COMMUNITY): Payer: Medicaid Other

## 2014-02-20 DIAGNOSIS — G8250 Quadriplegia, unspecified: Secondary | ICD-10-CM

## 2014-02-20 DIAGNOSIS — G9519 Other vascular myelopathies: Secondary | ICD-10-CM

## 2014-02-20 DIAGNOSIS — L03119 Cellulitis of unspecified part of limb: Secondary | ICD-10-CM

## 2014-02-20 DIAGNOSIS — E291 Testicular hypofunction: Secondary | ICD-10-CM

## 2014-02-20 DIAGNOSIS — E1149 Type 2 diabetes mellitus with other diabetic neurological complication: Secondary | ICD-10-CM

## 2014-02-20 DIAGNOSIS — L02419 Cutaneous abscess of limb, unspecified: Secondary | ICD-10-CM

## 2014-02-20 DIAGNOSIS — K3184 Gastroparesis: Secondary | ICD-10-CM

## 2014-02-20 LAB — URINALYSIS, ROUTINE W REFLEX MICROSCOPIC
BILIRUBIN URINE: NEGATIVE
Glucose, UA: NEGATIVE mg/dL
KETONES UR: NEGATIVE mg/dL
Leukocytes, UA: NEGATIVE
Nitrite: NEGATIVE
PH: 6 (ref 5.0–8.0)
Protein, ur: 30 mg/dL — AB
Specific Gravity, Urine: 1.017 (ref 1.005–1.030)
Urobilinogen, UA: 0.2 mg/dL (ref 0.0–1.0)

## 2014-02-20 LAB — GLUCOSE, RANDOM: GLUCOSE: 489 mg/dL — AB (ref 70–99)

## 2014-02-20 LAB — URINE MICROSCOPIC-ADD ON

## 2014-02-20 LAB — GLUCOSE, CAPILLARY
GLUCOSE-CAPILLARY: 80 mg/dL (ref 70–99)
GLUCOSE-CAPILLARY: 228 mg/dL — AB (ref 70–99)
Glucose-Capillary: 170 mg/dL — ABNORMAL HIGH (ref 70–99)
Glucose-Capillary: 444 mg/dL — ABNORMAL HIGH (ref 70–99)

## 2014-02-20 MED ORDER — INSULIN ASPART 100 UNIT/ML ~~LOC~~ SOLN
10.0000 [IU] | Freq: Once | SUBCUTANEOUS | Status: AC
  Administered 2014-02-20: 10 [IU] via SUBCUTANEOUS

## 2014-02-20 NOTE — Progress Notes (Signed)
Occupational Therapy Session Note  Patient Details  Name: Jason [name] MRN: [id number] Date of Birth: January 30, 1986  Today's Date: 02/20/2014 Time: 0932-6712 Time Calculation (min): 44 min  Skilled Therapeutic Interventions/Progress Updates:    Pt worked on bed mobility to position abdominal binder prior to sitting up.  Therapist assisted with donning leg loops and giving mod questioning cues as to the steps needed in order to turn over in the bed.  Mod assist need to turn side to side in order for the therapist to donn the binder.  Max assist to transition to sitting EOB.  He was able to maintain static sitting for 2 mins with min assist while therapist gathered sliding board.  [name] was able to perform a sliding board transfer with mod assist as well after therapist positioned the board up under his bottom.  Once in the chair he was able to propel himself down to the therapy gym with occasional min assist on 1 rest break.  In the gym had pt work on bilateral UE AROM by catching and tossing a football.  He was able to accurately catch but had increased difficulty tossing the ball with his right hand and could not toss it with the left hand.  Pt was able to push his way back to the room at the end of the session with min assist on 2 occasions.  Positioned beside of he bed with call bell in lap and significant other present as well.    Therapy Documentation Precautions:  Precautions Precautions: Fall Precaution Comments: sacral breakdown-boost when in w/c, abdominal binder and TEDs due to low BP. Required Braces or Orthoses: Other Brace/Splint Other Brace/Splint: Bilateral Prevalon boots at night Restrictions Weight Bearing Restrictions: No  Pain: Pain Assessment Pain Assessment: Faces Faces Pain Scale: Hurts a little bit Pain Type: Acute pain Pain Location: Neck Pain Intervention(s): Repositioned ADL: See FIM for current functional status  Therapy/Group: Individual  Therapy  Cindra Presume OTR/L 02/20/2014, 4:15 PM

## 2014-02-20 NOTE — Progress Notes (Signed)
Subjective/Complaints: Sleepy at times. Had a pretty good day yesterday.  Neuropathy pain controlled  A 12 point review of systems has been performed and if not noted above is otherwise negative.   Objective: Vital Signs: Blood pressure 113/75, pulse 87, temperature 97.3 F (36.3 C), temperature source Oral, resp. rate 19, weight 55.7 kg (122 lb 12.7 oz), SpO2 100.00%. No results found. Results for orders placed during the hospital encounter of 02/05/14 (from the past 72 hour(s))  CBC     Status: Abnormal   Collection Time    02/17/14  9:50 AM      Result Value Ref Range   WBC 13.9 (*) 4.0 - 10.5 K/uL   RBC 2.94 (*) 4.22 - 5.81 MIL/uL   Hemoglobin 7.5 (*) 13.0 - 17.0 g/dL   HCT 23.9 (*) 39.0 - 52.0 %   MCV 81.3  78.0 - 100.0 fL   MCH 25.5 (*) 26.0 - 34.0 pg   MCHC 31.4  30.0 - 36.0 g/dL   RDW 16.5 (*) 11.5 - 15.5 %   Platelets 432 (*) 150 - 400 K/uL  BASIC METABOLIC PANEL     Status: Abnormal   Collection Time    02/17/14  9:50 AM      Result Value Ref Range   Sodium 141  137 - 147 mEq/L   Potassium 4.3  3.7 - 5.3 mEq/L   Chloride 105  96 - 112 mEq/L   CO2 27  19 - 32 mEq/L   Glucose, Bld 121 (*) 70 - 99 mg/dL   BUN 29 (*) 6 - 23 mg/dL   Creatinine, Ser 0.88  0.50 - 1.35 mg/dL   Calcium 9.0  8.4 - 10.5 mg/dL   GFR calc non Af Amer >90  >90 mL/min   GFR calc Af Amer >90  >90 mL/min   Comment: (NOTE)     The eGFR has been calculated using the CKD EPI equation.     This calculation has not been validated in all clinical situations.     eGFR's persistently <90 mL/min signify possible Chronic Kidney     Disease.  GLUCOSE, CAPILLARY     Status: Abnormal   Collection Time    02/17/14 11:36 AM      Result Value Ref Range   Glucose-Capillary 47 (*) 70 - 99 mg/dL  GLUCOSE, CAPILLARY     Status: Abnormal   Collection Time    02/17/14 11:57 AM      Result Value Ref Range   Glucose-Capillary 49 (*) 70 - 99 mg/dL  GLUCOSE, CAPILLARY     Status: None   Collection Time     02/17/14 12:15 PM      Result Value Ref Range   Glucose-Capillary 74  70 - 99 mg/dL  URINE CULTURE     Status: None   Collection Time    02/17/14  2:31 PM      Result Value Ref Range   Specimen Description URINE, CATHETERIZED     Special Requests NONE     Culture  Setup Time       Value: 02/17/2014 19:11     Performed at [facility]       Value: NO GROWTH     Performed at [facility]   Culture       Value: NO GROWTH     Performed at [facility]   Report Status 02/18/2014 FINAL    URINALYSIS, ROUTINE W REFLEX  MICROSCOPIC     Status: Abnormal   Collection Time    02/17/14  2:31 PM      Result Value Ref Range   Color, Urine YELLOW  YELLOW   APPearance CLEAR  CLEAR   Specific Gravity, Urine 1.020  1.005 - 1.030   pH 5.5  5.0 - 8.0   Glucose, UA 100 (*) NEGATIVE mg/dL   Hgb urine dipstick MODERATE (*) NEGATIVE   Bilirubin Urine NEGATIVE  NEGATIVE   Ketones, ur NEGATIVE  NEGATIVE mg/dL   Protein, ur 100 (*) NEGATIVE mg/dL   Urobilinogen, UA 0.2  0.0 - 1.0 mg/dL   Nitrite NEGATIVE  NEGATIVE   Leukocytes, UA NEGATIVE  NEGATIVE  URINE MICROSCOPIC-ADD ON     Status: Abnormal   Collection Time    02/17/14  2:31 PM      Result Value Ref Range   Squamous Epithelial / LPF RARE  RARE   WBC, UA 0-2  <3 WBC/hpf   RBC / HPF 3-6  <3 RBC/hpf   Bacteria, UA FEW (*) RARE   Casts HYALINE CASTS (*) NEGATIVE   Urine-Other RARE YEAST    GLUCOSE, CAPILLARY     Status: Abnormal   Collection Time    02/17/14  4:55 PM      Result Value Ref Range   Glucose-Capillary 362 (*) 70 - 99 mg/dL  GLUCOSE, CAPILLARY     Status: Abnormal   Collection Time    02/17/14  8:54 PM      Result Value Ref Range   Glucose-Capillary 235 (*) 70 - 99 mg/dL  GLUCOSE, CAPILLARY     Status: None   Collection Time    02/18/14  7:19 AM      Result Value Ref Range   Glucose-Capillary 95  70 - 99 mg/dL   Comment 1 Notify RN    GLUCOSE, CAPILLARY     Status: Abnormal    Collection Time    02/18/14 11:13 AM      Result Value Ref Range   Glucose-Capillary 61 (*) 70 - 99 mg/dL   Comment 1 Notify RN    GLUCOSE, CAPILLARY     Status: Abnormal   Collection Time    02/18/14 11:44 AM      Result Value Ref Range   Glucose-Capillary 116 (*) 70 - 99 mg/dL   Comment 1 Notify RN    GLUCOSE, CAPILLARY     Status: Abnormal   Collection Time    02/18/14  4:25 PM      Result Value Ref Range   Glucose-Capillary 364 (*) 70 - 99 mg/dL   Comment 1 Notify RN    GLUCOSE, CAPILLARY     Status: Abnormal   Collection Time    02/18/14  8:51 PM      Result Value Ref Range   Glucose-Capillary 191 (*) 70 - 99 mg/dL  GLUCOSE, CAPILLARY     Status: Abnormal   Collection Time    02/19/14  7:05 AM      Result Value Ref Range   Glucose-Capillary 277 (*) 70 - 99 mg/dL   Comment 1 Notify RN    GLUCOSE, CAPILLARY     Status: Abnormal   Collection Time    02/19/14 11:32 AM      Result Value Ref Range   Glucose-Capillary 129 (*) 70 - 99 mg/dL   Comment 1 Notify RN    GLUCOSE, CAPILLARY     Status: Abnormal   Collection Time  02/19/14  4:25 PM      Result Value Ref Range   Glucose-Capillary 203 (*) 70 - 99 mg/dL   Comment 1 Notify RN    GLUCOSE, CAPILLARY     Status: Abnormal   Collection Time    02/19/14  8:41 PM      Result Value Ref Range   Glucose-Capillary 234 (*) 70 - 99 mg/dL  GLUCOSE, CAPILLARY     Status: None   Collection Time    02/20/14  7:07 AM      Result Value Ref Range   Glucose-Capillary 80  70 - 99 mg/dL   Comment 1 Notify RN       HEENT: normal Cardio: RRR and no murmur Resp: CTA B/L and unlabored GI: BS positive and mildly distended and tympanitic, mild tenderness Extremity:  No Edema Skin:  . Neuro: Alert/Oriented, Abnormal Sensory absent in feet and Abnormal Motor 0/5 in LE,0/5 finger extension and 0/5 hand intrinsic. Wrist extension 2-, 3/5 tricep   2, biceps 5/5. Decreased sensation below elbows bilaterally but able to ID LT in Left C6-7  dermatomes Musc/Skel:  Left trap less tender. Clavicle non-tender Gen NAD   Assessment/Plan: 1. 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 Physiatrist is providing close team supervision and 24 hour management of active medical problems listed below. Physiatrist and rehab team continue to assess barriers to discharge/monitor patient progress toward functional and medical goals.   FIM: FIM - Bathing Bathing Steps Patient Completed: Chest;Abdomen Bathing:  (performed with his mother)  FIM - Upper Body Dressing/Undressing Upper body dressing/undressing steps patient completed: Thread/unthread right sleeve of pullover shirt/dresss;Thread/unthread left sleeve of pullover shirt/dress;Put head through opening of pull over shirt/dress Upper body dressing/undressing: 4: Min-Patient completed 75 plus % of tasks FIM - Lower Body Dressing/Undressing Lower body dressing/undressing steps patient completed: Thread/unthread right pants leg;Thread/unthread left pants leg;Pull pants up/down;Don/Doff right sock;Don/Doff left sock Lower body dressing/undressing: 1: Total-Patient completed less than 25% of tasks  FIM - Toileting Toileting: 0: Activity did not occur  FIM - Air cabin crew Transfers: 0-Activity did not occur  FIM - Control and instrumentation engineer Devices: HOB elevated;Bed rails Bed/Chair Transfer: 2: Sit > Supine: Max A (lifting assist/Pt. 25-49%);3: Supine > Sit: Mod A (lifting assist/Pt. 50-74%/lift 2 legs  FIM - Locomotion: Wheelchair Distance: 50 Locomotion: Wheelchair: 0: Activity did not occur FIM - Locomotion: Ambulation Locomotion: Ambulation: 0: Activity did not occur  Comprehension Comprehension Mode: Auditory Comprehension: 5-Understands basic 90% of the time/requires cueing < 10% of the time  Expression Expression Mode:  Verbal Expression: 5-Expresses basic 90% of the time/requires cueing < 10% of the time.  Social Interaction Social Interaction: 5-Interacts appropriately 90% of the time - Needs monitoring or encouragement for participation or interaction.  Problem Solving Problem Solving: 5-Solves basic 90% of the time/requires cueing < 10% of the time  Memory Memory: 5-Recognizes or recalls 90% of the time/requires cueing < 10% of the time  Medical Problem List and Plan:  1. 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 DVT Prophylaxis/Anticoagulation: Pharmaceutical: Lovenox  3. Chronic Pain Management: Continue MS contin 30 mg bid. Air bed for back and buttock pain as well as pressure relief measures.   Has non painful sensory parasthesias in Zone of Partial Preservation  LUE   -reminded him to use heating pad for shoulder/neck symptoms, good posture when  in bed and chair  -continues robaxin. Consider further TPI's----need to get him in a sitting position to palptate  -his clavicle fracture also in this general area with distraction laterally---denies any pain in this area.  -try to back of gabapentin a bit (drop daytime doses) to help sleepiness 4. Mood: reviewed anxiety with patient. Discussed triggers and ways to de-escalate. Also have added prn xanax if he is unable to manage it.  -neuropsych assessment also requested 5. Neuropsych: This patient is capable of making decisions on his own behalf.  6. Spinal cord infarct: Initiate steroid taper and monitor for BS changes as dose decreases.--   7. DM type 2 with gastroparesis: Brittle diabetic per mother--variable intake due to GI symptoms/gastroparesis with subsequent wide fluctuations. Increase PM lantus to 30, Increased am lantus to 8u. Now on 3u novolog units tid ac for meal coverage.    -consider endocrine consult 8. ChronicBLE wounds with chronically elevated ESR: continue  doxycline for 30 days (started 02/02/14). Follow up with ID after discharge. Continue local wound care.   -right elbow granulating in  -wet-dry with allevyn over right popliteal area 9. Constipation/diabetic gastroparesis/neurogenic bowel: Will resume reglan to help with constipation as well as GI symptoms . Bowel program - D/C miralx, cont colace and bisacodyl supp 10. Severe diabetic peripheral neuropathy: Continue neurontin 130m bid an 4080m qhs   11. Pulmonary:  deep breathing, IS/FV, CPAP -quad cough, assistive techniques  -keep hob at 30 degrees  -chest PT 12. Low grade temp:   -labs ok , likely atelectasis---check cxr however  -check urine also   LOS (Days) 15 A FACE TO FACE EVALUATION WAS PERFORMED  ZaMeredith Staggers/16/2015, 9:37 AM

## 2014-02-21 ENCOUNTER — Inpatient Hospital Stay (HOSPITAL_COMMUNITY): Payer: Self-pay

## 2014-02-21 LAB — BASIC METABOLIC PANEL
BUN: 36 mg/dL — AB (ref 6–23)
CALCIUM: 9.2 mg/dL (ref 8.4–10.5)
CO2: 26 mEq/L (ref 19–32)
Chloride: 100 mEq/L (ref 96–112)
Creatinine, Ser: 0.82 mg/dL (ref 0.50–1.35)
GFR calc Af Amer: >90 mL/min (ref >90)
Glucose, Bld: 56 mg/dL — ABNORMAL LOW (ref 70–99)
POTASSIUM: 4.3 meq/L (ref 3.7–5.3)
SODIUM: 136 meq/L — AB (ref 137–147)

## 2014-02-21 LAB — CBC
HCT: 23.4 % — ABNORMAL LOW (ref 39.0–52.0)
Hemoglobin: 7.4 g/dL — ABNORMAL LOW (ref 13.0–17.0)
MCH: 25.3 pg — ABNORMAL LOW (ref 26.0–34.0)
MCHC: 31.6 g/dL (ref 30.0–36.0)
MCV: 79.9 fL (ref 78.0–100.0)
Platelets: 455 10*3/uL — ABNORMAL HIGH (ref 150–400)
RBC: 2.93 MIL/uL — ABNORMAL LOW (ref 4.22–5.81)
RDW: 16.1 % — ABNORMAL HIGH (ref 11.5–15.5)
WBC: 14.1 10*3/uL — ABNORMAL HIGH (ref 4.0–10.5)

## 2014-02-21 LAB — GLUCOSE, CAPILLARY
GLUCOSE-CAPILLARY: 38 mg/dL — AB (ref 70–99)
GLUCOSE-CAPILLARY: 377 mg/dL — AB (ref 70–99)
Glucose-Capillary: 143 mg/dL — ABNORMAL HIGH (ref 70–99)
Glucose-Capillary: 209 mg/dL — ABNORMAL HIGH (ref 70–99)
Glucose-Capillary: 224 mg/dL — ABNORMAL HIGH (ref 70–99)
Glucose-Capillary: 476 mg/dL — ABNORMAL HIGH (ref 70–99)
Glucose-Capillary: 50 mg/dL — ABNORMAL LOW (ref 70–99)

## 2014-02-21 MED ORDER — INSULIN GLARGINE 100 UNIT/ML ~~LOC~~ SOLN
7.0000 [IU] | SUBCUTANEOUS | Status: AC
  Administered 2014-02-21: 7 [IU] via SUBCUTANEOUS
  Filled 2014-02-21: qty 0.07

## 2014-02-21 MED ORDER — INSULIN GLARGINE 100 UNIT/ML ~~LOC~~ SOLN
15.0000 [IU] | Freq: Every day | SUBCUTANEOUS | Status: DC
  Administered 2014-02-22 – 2014-02-23 (×2): 15 [IU] via SUBCUTANEOUS
  Filled 2014-02-21 (×3): qty 0.15

## 2014-02-21 MED ORDER — INSULIN GLARGINE 100 UNIT/ML ~~LOC~~ SOLN
22.0000 [IU] | Freq: Every day | SUBCUTANEOUS | Status: DC
  Administered 2014-02-21: 22 [IU] via SUBCUTANEOUS
  Filled 2014-02-21 (×2): qty 0.22

## 2014-02-21 MED ORDER — INSULIN ASPART 100 UNIT/ML ~~LOC~~ SOLN
12.0000 [IU] | Freq: Once | SUBCUTANEOUS | Status: AC
  Administered 2014-02-21: 12 [IU] via SUBCUTANEOUS

## 2014-02-21 NOTE — Progress Notes (Signed)
Subjective/Complaints: Had movement in legs yesterday! Pt really l iked going on grounds pass A 12 point review of systems has been performed and if not noted above is otherwise negative.   Objective: Vital Signs: Blood pressure 109/75, pulse 98, temperature 99.3 F (37.4 C), temperature source Oral, resp. rate 18, weight 47.673 kg (105 lb 1.6 oz), SpO2 99.00%. Dg Chest 2 View  02/20/2014   CLINICAL DATA:  History of diabetes.  Low grade fever.  EXAM: CHEST  2 VIEW  COMPARISON:  DG CHEST 1V PORT dated 02/17/2014  FINDINGS: Stable cardiac mediastinal contours. No consolidative pulmonary opacities. No pleural effusion or pneumothorax. Unchanged chronic fracture of the distal aspect of the left clavicle.  IMPRESSION: No acute cardiopulmonary process.   Electronically Signed   By: Lovey Newcomer M.D.   On: 02/20/2014 14:25   Results for orders placed during the hospital encounter of 02/05/14 (from the past 72 hour(s))  GLUCOSE, CAPILLARY     Status: Abnormal   Collection Time    02/18/14 11:13 AM      Result Value Ref Range   Glucose-Capillary 61 (*) 70 - 99 mg/dL   Comment 1 Notify RN    GLUCOSE, CAPILLARY     Status: Abnormal   Collection Time    02/18/14 11:44 AM      Result Value Ref Range   Glucose-Capillary 116 (*) 70 - 99 mg/dL   Comment 1 Notify RN    GLUCOSE, CAPILLARY     Status: Abnormal   Collection Time    02/18/14  4:25 PM      Result Value Ref Range   Glucose-Capillary 364 (*) 70 - 99 mg/dL   Comment 1 Notify RN    GLUCOSE, CAPILLARY     Status: Abnormal   Collection Time    02/18/14  8:51 PM      Result Value Ref Range   Glucose-Capillary 191 (*) 70 - 99 mg/dL  GLUCOSE, CAPILLARY     Status: Abnormal   Collection Time    02/19/14  7:05 AM      Result Value Ref Range   Glucose-Capillary 277 (*) 70 - 99 mg/dL   Comment 1 Notify RN    GLUCOSE, CAPILLARY     Status: Abnormal   Collection Time    02/19/14 11:32 AM      Result Value Ref Range   Glucose-Capillary  129 (*) 70 - 99 mg/dL   Comment 1 Notify RN    GLUCOSE, CAPILLARY     Status: Abnormal   Collection Time    02/19/14  4:25 PM      Result Value Ref Range   Glucose-Capillary 203 (*) 70 - 99 mg/dL   Comment 1 Notify RN    GLUCOSE, CAPILLARY     Status: Abnormal   Collection Time    02/19/14  8:41 PM      Result Value Ref Range   Glucose-Capillary 234 (*) 70 - 99 mg/dL  GLUCOSE, CAPILLARY     Status: None   Collection Time    02/20/14  7:07 AM      Result Value Ref Range   Glucose-Capillary 80  70 - 99 mg/dL   Comment 1 Notify RN    GLUCOSE, CAPILLARY     Status: Abnormal   Collection Time    02/20/14 11:39 AM      Result Value Ref Range   Glucose-Capillary 170 (*) 70 - 99 mg/dL  URINALYSIS, ROUTINE W REFLEX MICROSCOPIC  Status: Abnormal   Collection Time    02/20/14  1:58 PM      Result Value Ref Range   Color, Urine YELLOW  YELLOW   APPearance CLEAR  CLEAR   Specific Gravity, Urine 1.017  1.005 - 1.030   pH 6.0  5.0 - 8.0   Glucose, UA NEGATIVE  NEGATIVE mg/dL   Hgb urine dipstick MODERATE (*) NEGATIVE   Bilirubin Urine NEGATIVE  NEGATIVE   Ketones, ur NEGATIVE  NEGATIVE mg/dL   Protein, ur 30 (*) NEGATIVE mg/dL   Urobilinogen, UA 0.2  0.0 - 1.0 mg/dL   Nitrite NEGATIVE  NEGATIVE   Leukocytes, UA NEGATIVE  NEGATIVE  URINE MICROSCOPIC-ADD ON     Status: None   Collection Time    02/20/14  1:58 PM      Result Value Ref Range   Squamous Epithelial / LPF RARE  RARE   WBC, UA 0-2  <3 WBC/hpf   RBC / HPF 7-10  <3 RBC/hpf   Bacteria, UA RARE  RARE  GLUCOSE, CAPILLARY     Status: Abnormal   Collection Time    02/20/14  4:50 PM      Result Value Ref Range   Glucose-Capillary 228 (*) 70 - 99 mg/dL   Comment 1 Notify RN    GLUCOSE, CAPILLARY     Status: Abnormal   Collection Time    02/20/14  8:33 PM      Result Value Ref Range   Glucose-Capillary 444 (*) 70 - 99 mg/dL  GLUCOSE, RANDOM     Status: Abnormal   Collection Time    02/20/14  9:42 PM      Result Value  Ref Range   Glucose, Bld 489 (*) 70 - 99 mg/dL  GLUCOSE, CAPILLARY     Status: Abnormal   Collection Time    02/21/14 12:30 AM      Result Value Ref Range   Glucose-Capillary 476 (*) 70 - 99 mg/dL  CBC     Status: Abnormal   Collection Time    02/21/14  6:05 AM      Result Value Ref Range   WBC 14.1 (*) 4.0 - 10.5 K/uL   RBC 2.93 (*) 4.22 - 5.81 MIL/uL   Hemoglobin 7.4 (*) 13.0 - 17.0 g/dL   HCT 23.4 (*) 39.0 - 52.0 %   MCV 79.9  78.0 - 100.0 fL   MCH 25.3 (*) 26.0 - 34.0 pg   MCHC 31.6  30.0 - 36.0 g/dL   RDW 16.1 (*) 11.5 - 15.5 %   Platelets 455 (*) 150 - 400 K/uL  BASIC METABOLIC PANEL     Status: Abnormal   Collection Time    02/21/14  6:05 AM      Result Value Ref Range   Sodium 136 (*) 137 - 147 mEq/L   Potassium 4.3  3.7 - 5.3 mEq/L   Chloride 100  96 - 112 mEq/L   CO2 26  19 - 32 mEq/L   Glucose, Bld 56 (*) 70 - 99 mg/dL   BUN 36 (*) 6 - 23 mg/dL   Creatinine, Ser 0.82  0.50 - 1.35 mg/dL   Calcium 9.2  8.4 - 10.5 mg/dL   GFR calc non Af Amer >90  >90 mL/min   GFR calc Af Amer >90  >90 mL/min   Comment: (NOTE)     The eGFR has been calculated using the CKD EPI equation.     This calculation has not been  validated in all clinical situations.     eGFR's persistently <90 mL/min signify possible Chronic Kidney     Disease.  GLUCOSE, CAPILLARY     Status: Abnormal   Collection Time    02/21/14  6:35 AM      Result Value Ref Range   Glucose-Capillary 38 (*) 70 - 99 mg/dL  GLUCOSE, CAPILLARY     Status: Abnormal   Collection Time    02/21/14  7:00 AM      Result Value Ref Range   Glucose-Capillary 50 (*) 70 - 99 mg/dL  GLUCOSE, CAPILLARY     Status: Abnormal   Collection Time    02/21/14  7:32 AM      Result Value Ref Range   Glucose-Capillary 224 (*) 70 - 99 mg/dL     HEENT: normal Cardio: RRR and no murmur Resp: CTA B/L and unlabored GI: BS positive and mildly distended and tympanitic, mild tenderness Extremity:  No Edema Skin:  . Neuro:  Alert/Oriented, Abnormal Sensory absent in feet and Abnormal Motor 0/5 in LE,0/5 finger extension and 0/5 hand intrinsic. Wrist extension 2-, 3/5 tricep   2, biceps 5/5. Decreased sensation below elbows bilaterally but able to ID LT in Left C6-7 dermatomes. ?trace HF on right. Musc/Skel:  Generalized shoulder girdle tenderness. Clavicle non-tender Gen NAD   Assessment/Plan: 1. 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 Physiatrist is providing close team supervision and 24 hour management of active medical problems listed below. Physiatrist and rehab team continue to assess barriers to discharge/monitor patient progress toward functional and medical goals.   FIM: FIM - Bathing Bathing Steps Patient Completed: Chest;Abdomen Bathing:  (performed with his mother)  FIM - Upper Body Dressing/Undressing Upper body dressing/undressing steps patient completed: Thread/unthread right sleeve of pullover shirt/dresss;Thread/unthread left sleeve of pullover shirt/dress;Put head through opening of pull over shirt/dress Upper body dressing/undressing: 4: Min-Patient completed 75 plus % of tasks FIM - Lower Body Dressing/Undressing Lower body dressing/undressing steps patient completed: Thread/unthread right pants leg;Thread/unthread left pants leg;Pull pants up/down;Don/Doff right sock;Don/Doff left sock Lower body dressing/undressing: 1: Total-Patient completed less than 25% of tasks  FIM - Toileting Toileting: 0: Activity did not occur  FIM - Air cabin crew Transfers: 0-Activity did not occur  FIM - Control and instrumentation engineer Devices: HOB elevated;Bed rails Bed/Chair Transfer: 2: Supine > Sit: Max A (lifting assist/Pt. 25-49%);2: Bed > Chair or W/C: Max A (lift and lower assist)  FIM - Locomotion: Wheelchair Distance: 50 Locomotion: Wheelchair: 0: Activity did  not occur FIM - Locomotion: Ambulation Locomotion: Ambulation: 0: Activity did not occur  Comprehension Comprehension Mode: Auditory Comprehension: 5-Understands basic 90% of the time/requires cueing < 10% of the time  Expression Expression Mode: Verbal Expression: 5-Expresses basic 90% of the time/requires cueing < 10% of the time.  Social Interaction Social Interaction: 5-Interacts appropriately 90% of the time - Needs monitoring or encouragement for participation or interaction.  Problem Solving Problem Solving: 5-Solves basic 90% of the time/requires cueing < 10% of the time  Memory Memory: 5-Recognizes or recalls 90% of the time/requires cueing < 10% of the time  Medical Problem List and Plan:  1. 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 DVT Prophylaxis/Anticoagulation: Pharmaceutical: Lovenox  3. Chronic Pain Management: Continue MS contin 30 mg bid. Air bed for back and buttock pain as well as pressure relief measures.  Has non painful sensory parasthesias in Zone of Partial Preservation  LUE   -reminded him to use heating pad for shoulder/neck symptoms, good posture when in bed and chair  -continues robaxin. Consider further TPI's this week---need to get him in a sitting position to palptate  -his clavicle fracture also in this general area with distraction laterally---denies any pain in this area.  -day time gabapentin decreased to help with grogginess. 4. Mood: reviewed anxiety with patient. Discussed triggers and ways to de-escalate. Also have added prn xanax if he is unable to manage it.  -encouraged grounds pass again 5. Neuropsych: This patient is capable of making decisions on his own behalf.  6. Spinal cord infarct: steroids off-   7. DM type 2 with gastroparesis: Brittle diabetic per mother--variable intake due to GI symptoms/gastroparesis with subsequent wide fluctuations. Decrease PM  lantus to 22units , Increased am lantus to 14u. Now on 3u novolog units tid ac for meal coverage.    -will request endocrine consult tomorrow 8. ChronicBLE wounds with chronically elevated ESR: continue doxycline for 30 days (started 02/02/14). Follow up with ID after discharge. Continue local wound care.   -right elbow granulating in  -wet-dry with allevyn over right popliteal area 9. Constipation/diabetic gastroparesis/neurogenic bowel: Will resume reglan to help with constipation as well as GI symptoms . Bowel program - D/C miralx, cont colace and bisacodyl supp 10. Severe diabetic peripheral neuropathy: Continue neurontin 140m bid an 4054m qhs   11. Pulmonary:  deep breathing, IS/FV, CPAP -quad cough, assistive techniques  -keep hob at 30 degrees  -chest PT 12. Low grade temp:   -labs ok ,cxr ok  -UA suspicious---await micro   LOS (Days) 16 A FACE TO FACE EVALUATION WAS PERFORMED  ZaMeredith Staggers/17/2015, 9:09 AM

## 2014-02-21 NOTE — Significant Event (Signed)
Hypoglycemic Event  CBG: 50  Treatment: 15 GM carbohydrate snack  Symptoms: None  Follow-up CBG: Time:0733 CBG Result 224  Possible Reasons for Event: Unknown  Comments/MD notified: Dr. Melvia Heaps  Remember to initiate Hypoglycemia Order Set & complete

## 2014-02-21 NOTE — Progress Notes (Signed)
Physical Therapy Session Note  Patient Details  Name: Jason [name] MRN: [id number] Date of Birth: [birth year]  Today's Date: 02/21/2014 Time: 9038-3338 Time Calculation (min): 35 min  Skilled Therapeutic Interventions/Progress Updates:  1:1. Pt received semi-reclined in bed, ready for therapy. Pt verbalized 8/10 pain in back, RN made aware and present to provide pain medicine. Focus this session on bed mobility and t/f bed>w/c with emphasis on pt directing care. Pt demonstrates good problem solving for t/f sup>sit EOB w/ HOB elevated, use of bed rail and leg loops. Min cues for improved use of techniques. Pt doing good job of directing therapist or mom for help as needed to complete SBT bed>w/c as well as positioning in w/c. Overall, pt req max A due to pain and muscle spasms in back.   Therapy Documentation Precautions:  Precautions Precautions: Fall Precaution Comments: sacral breakdown-boost when in w/c, abdominal binder and TEDs due to low BP. Required Braces or Orthoses: Other Brace/Splint Other Brace/Splint: Bilateral Prevalon boots at night Restrictions Weight Bearing Restrictions: No  See FIM for current functional status  Therapy/Group: Individual Therapy  [name] 02/21/2014, 3:57 PM

## 2014-02-21 NOTE — Significant Event (Signed)
Hypoglycemic Event  CBG: 38  Treatment: 15 GM carbohydrate snack  Symptoms: Nervous/irritable  Follow-up CBG: Time:0700 CBG Result:50  Possible Reasons for Event: Other: brittle diabetic  Comments/MD notified:Dr. Betsy Coder Anara Cowman  Remember to initiate Hypoglycemia Order Set & complete

## 2014-02-22 ENCOUNTER — Inpatient Hospital Stay (HOSPITAL_COMMUNITY): Payer: Self-pay | Admitting: Occupational Therapy

## 2014-02-22 ENCOUNTER — Inpatient Hospital Stay (HOSPITAL_COMMUNITY): Payer: Medicaid Other | Admitting: Physical Therapy

## 2014-02-22 ENCOUNTER — Encounter (HOSPITAL_COMMUNITY): Payer: Self-pay | Admitting: Occupational Therapy

## 2014-02-22 LAB — GLUCOSE, CAPILLARY
GLUCOSE-CAPILLARY: 50 mg/dL — AB (ref 70–99)
GLUCOSE-CAPILLARY: 248 mg/dL — AB (ref 70–99)
GLUCOSE-CAPILLARY: 237 mg/dL — AB (ref 70–99)
Glucose-Capillary: 101 mg/dL — ABNORMAL HIGH (ref 70–99)
Glucose-Capillary: 133 mg/dL — ABNORMAL HIGH (ref 70–99)

## 2014-02-22 LAB — URINE CULTURE
COLONY COUNT: NO GROWTH
CULTURE: NO GROWTH

## 2014-02-22 MED ORDER — INSULIN GLARGINE 100 UNIT/ML ~~LOC~~ SOLN
20.0000 [IU] | Freq: Every day | SUBCUTANEOUS | Status: DC
  Administered 2014-02-22: 20 [IU] via SUBCUTANEOUS
  Filled 2014-02-22 (×2): qty 0.2

## 2014-02-22 MED ORDER — GABAPENTIN 100 MG PO CAPS
100.0000 mg | ORAL_CAPSULE | Freq: Two times a day (BID) | ORAL | Status: DC
  Administered 2014-02-22 – 2014-02-23 (×2): 100 mg via ORAL
  Filled 2014-02-22 (×4): qty 1

## 2014-02-22 NOTE — Progress Notes (Signed)
Recreational Therapy Session Note  Patient Details  Name: Jason [name] MRN: [id number] Date of Birth: [birth year] Today's Date: 02/22/2014  Pain: c/o Bilateral shoulder pain, RN aware Skilled Therapeutic Interventions/Progress Updates: Session focused on sitting balance & slide board transfer mat-->w/c.  Pt in gym on mat with PT upon my entry.  Pt with reports of feeling "funny" and PT taking BP.  BP 84/57, pt reclined & BLE's elevated.  Pt BP up to 96/68.  Pt required min-mod assist for balance & min assist for slide board transfer.  Therapy/Group: Co-Treatment  Waldon Reining 02/22/2014, 4:07 PM

## 2014-02-22 NOTE — Progress Notes (Signed)
Occupational Therapy Session Note  Patient Details  Name: Jason [name] MRN: [id number] Date of Birth: [birth year]  Today's Date: 02/22/2014 Time: 0930 - 1028 Time Calculation: 3min   Skilled Therapeutic Interventions/Progress Updates:    Pt seen for individual OT treatment session for dressing, bed mobility to position abdominal binder prior to sitting up. Pt assisted with donning leg loops in long sitting w/ HOB elevated and Min A for positioning on upper leg. SBA assist needed to turn side to side in order for the therapist to donn binder. Max assist to transition to sitting EOB, and Max A for sliding board transfer to w/c this am. Pt with noted LUE/shoulder pain therefore, trigger point release and MFR techniques x19min while pt was still in bed on his right side and LUE was propped up on a pillow. After pt was up in w/c, moist heat was applied to L shoulder x8 min w/o adverse reaction. Pt left sitting in w/c w/ call bell in reach and family/mother in room.   Therapy Documentation Precautions:  Precautions Precautions: Fall Precaution Comments: sacral breakdown-boost when in w/c, abdominal binder and TEDs due to low BP. Required Braces or Orthoses: Other Brace/Splint Other Brace/Splint: Bilateral Prevalon boots at night Restrictions Weight Bearing Restrictions: No   Vital Signs:   Pain: Pt reports LUE/shoulder pain 9/10. Pt used call bell to notify RN staff and request pain medication.   ADL: ADL ADL Comments: refer to FIM     See FIM for current functional status  Therapy/Group: Individual Therapy  Amy B Barnhill 02/22/2014, 10:23 AM

## 2014-02-22 NOTE — Progress Notes (Signed)
Subjective/Complaints: Low cbg this morning again. Hands bothering him more (dysesthesias) A 12 point review of systems has been performed and if not noted above is otherwise negative.   Objective: Vital Signs: Blood pressure 127/75, pulse 86, temperature 97.8 F (36.6 C), temperature source Oral, resp. rate 19, weight 47.673 kg (105 lb 1.6 oz), SpO2 96.00%. Dg Chest 2 View  02/20/2014   CLINICAL DATA:  History of diabetes.  Low grade fever.  EXAM: CHEST  2 VIEW  COMPARISON:  DG CHEST 1V PORT dated 02/17/2014  FINDINGS: Stable cardiac mediastinal contours. No consolidative pulmonary opacities. No pleural effusion or pneumothorax. Unchanged chronic fracture of the distal aspect of the left clavicle.  IMPRESSION: No acute cardiopulmonary process.   Electronically Signed   By: Lovey Newcomer M.D.   On: 02/20/2014 14:25   Results for orders placed during the hospital encounter of 02/05/14 (from the past 72 hour(s))  GLUCOSE, CAPILLARY     Status: Abnormal   Collection Time    02/19/14 11:32 AM      Result Value Ref Range   Glucose-Capillary 129 (*) 70 - 99 mg/dL   Comment 1 Notify RN    GLUCOSE, CAPILLARY     Status: Abnormal   Collection Time    02/19/14  4:25 PM      Result Value Ref Range   Glucose-Capillary 203 (*) 70 - 99 mg/dL   Comment 1 Notify RN    GLUCOSE, CAPILLARY     Status: Abnormal   Collection Time    02/19/14  8:41 PM      Result Value Ref Range   Glucose-Capillary 234 (*) 70 - 99 mg/dL  GLUCOSE, CAPILLARY     Status: None   Collection Time    02/20/14  7:07 AM      Result Value Ref Range   Glucose-Capillary 80  70 - 99 mg/dL   Comment 1 Notify RN    GLUCOSE, CAPILLARY     Status: Abnormal   Collection Time    02/20/14 11:39 AM      Result Value Ref Range   Glucose-Capillary 170 (*) 70 - 99 mg/dL  URINALYSIS, ROUTINE W REFLEX MICROSCOPIC     Status: Abnormal   Collection Time    02/20/14  1:58 PM      Result Value Ref Range   Color, Urine YELLOW  YELLOW   APPearance CLEAR  CLEAR   Specific Gravity, Urine 1.017  1.005 - 1.030   pH 6.0  5.0 - 8.0   Glucose, UA NEGATIVE  NEGATIVE mg/dL   Hgb urine dipstick MODERATE (*) NEGATIVE   Bilirubin Urine NEGATIVE  NEGATIVE   Ketones, ur NEGATIVE  NEGATIVE mg/dL   Protein, ur 30 (*) NEGATIVE mg/dL   Urobilinogen, UA 0.2  0.0 - 1.0 mg/dL   Nitrite NEGATIVE  NEGATIVE   Leukocytes, UA NEGATIVE  NEGATIVE  URINE MICROSCOPIC-ADD ON     Status: None   Collection Time    02/20/14  1:58 PM      Result Value Ref Range   Squamous Epithelial / LPF RARE  RARE   WBC, UA 0-2  <3 WBC/hpf   RBC / HPF 7-10  <3 RBC/hpf   Bacteria, UA RARE  RARE  GLUCOSE, CAPILLARY     Status: Abnormal   Collection Time    02/20/14  4:50 PM      Result Value Ref Range   Glucose-Capillary 228 (*) 70 - 99 mg/dL   Comment 1 Notify  RN    GLUCOSE, CAPILLARY     Status: Abnormal   Collection Time    02/20/14  8:33 PM      Result Value Ref Range   Glucose-Capillary 444 (*) 70 - 99 mg/dL  GLUCOSE, RANDOM     Status: Abnormal   Collection Time    02/20/14  9:42 PM      Result Value Ref Range   Glucose, Bld 489 (*) 70 - 99 mg/dL  GLUCOSE, CAPILLARY     Status: Abnormal   Collection Time    02/21/14 12:30 AM      Result Value Ref Range   Glucose-Capillary 476 (*) 70 - 99 mg/dL  CBC     Status: Abnormal   Collection Time    02/21/14  6:05 AM      Result Value Ref Range   WBC 14.1 (*) 4.0 - 10.5 K/uL   RBC 2.93 (*) 4.22 - 5.81 MIL/uL   Hemoglobin 7.4 (*) 13.0 - 17.0 g/dL   HCT 23.4 (*) 39.0 - 52.0 %   MCV 79.9  78.0 - 100.0 fL   MCH 25.3 (*) 26.0 - 34.0 pg   MCHC 31.6  30.0 - 36.0 g/dL   RDW 16.1 (*) 11.5 - 15.5 %   Platelets 455 (*) 150 - 400 K/uL  BASIC METABOLIC PANEL     Status: Abnormal   Collection Time    02/21/14  6:05 AM      Result Value Ref Range   Sodium 136 (*) 137 - 147 mEq/L   Potassium 4.3  3.7 - 5.3 mEq/L   Chloride 100  96 - 112 mEq/L   CO2 26  19 - 32 mEq/L   Glucose, Bld 56 (*) 70 - 99 mg/dL   BUN  36 (*) 6 - 23 mg/dL   Creatinine, Ser 0.82  0.50 - 1.35 mg/dL   Calcium 9.2  8.4 - 10.5 mg/dL   GFR calc non Af Amer >90  >90 mL/min   GFR calc Af Amer >90  >90 mL/min   Comment: (NOTE)     The eGFR has been calculated using the CKD EPI equation.     This calculation has not been validated in all clinical situations.     eGFR's persistently <90 mL/min signify possible Chronic Kidney     Disease.  GLUCOSE, CAPILLARY     Status: Abnormal   Collection Time    02/21/14  6:35 AM      Result Value Ref Range   Glucose-Capillary 38 (*) 70 - 99 mg/dL  GLUCOSE, CAPILLARY     Status: Abnormal   Collection Time    02/21/14  7:00 AM      Result Value Ref Range   Glucose-Capillary 50 (*) 70 - 99 mg/dL  GLUCOSE, CAPILLARY     Status: Abnormal   Collection Time    02/21/14  7:32 AM      Result Value Ref Range   Glucose-Capillary 224 (*) 70 - 99 mg/dL  GLUCOSE, CAPILLARY     Status: Abnormal   Collection Time    02/21/14 11:29 AM      Result Value Ref Range   Glucose-Capillary 377 (*) 70 - 99 mg/dL  GLUCOSE, CAPILLARY     Status: Abnormal   Collection Time    02/21/14  4:51 PM      Result Value Ref Range   Glucose-Capillary 209 (*) 70 - 99 mg/dL  GLUCOSE, CAPILLARY     Status: Abnormal  Collection Time    02/21/14  9:26 PM      Result Value Ref Range   Glucose-Capillary 143 (*) 70 - 99 mg/dL   Comment 1 Notify RN    GLUCOSE, CAPILLARY     Status: Abnormal   Collection Time    02/22/14  6:39 AM      Result Value Ref Range   Glucose-Capillary 50 (*) 70 - 99 mg/dL  GLUCOSE, CAPILLARY     Status: Abnormal   Collection Time    02/22/14  7:14 AM      Result Value Ref Range   Glucose-Capillary 101 (*) 70 - 99 mg/dL   Comment 1 Notify RN       HEENT: normal Cardio: RRR and no murmur Resp: CTA B/L and unlabored GI: BS positive and mildly distended and tympanitic, mild tenderness Extremity:  No Edema Skin:  . Neuro: Alert/Oriented, Abnormal Sensory absent in feet and Abnormal Motor  0/5 in LE,0/5 finger extension and 0/5 hand intrinsic. Wrist extension 2-, 3/5 tricep   2, biceps 5/5. Decreased sensation below elbows bilaterally but able to ID LT in Left C6-7 dermatomes. ?trace HF on right. Musc/Skel:  Generalized shoulder girdle tenderness. Clavicle non-tender Gen NAD   Assessment/Plan: 1. 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 Physiatrist is providing close team supervision and 24 hour management of active medical problems listed below. Physiatrist and rehab team continue to assess barriers to discharge/monitor patient progress toward functional and medical goals.   FIM: FIM - Bathing Bathing Steps Patient Completed: Chest;Abdomen Bathing:  (performed with his mother)  FIM - Upper Body Dressing/Undressing Upper body dressing/undressing steps patient completed: Thread/unthread right sleeve of pullover shirt/dresss;Thread/unthread left sleeve of pullover shirt/dress;Put head through opening of pull over shirt/dress Upper body dressing/undressing: 4: Min-Patient completed 75 plus % of tasks FIM - Lower Body Dressing/Undressing Lower body dressing/undressing steps patient completed: Thread/unthread right pants leg;Thread/unthread left pants leg;Pull pants up/down;Don/Doff right sock;Don/Doff left sock Lower body dressing/undressing: 1: Total-Patient completed less than 25% of tasks  FIM - Toileting Toileting: 0: Activity did not occur  FIM - Air cabin crew Transfers: 0-Activity did not occur  FIM - Control and instrumentation engineer Devices: HOB elevated;Bed rails Bed/Chair Transfer: 2: Supine > Sit: Max A (lifting assist/Pt. 25-49%);2: Bed > Chair or W/C: Max A (lift and lower assist)  FIM - Locomotion: Wheelchair Distance: 50 Locomotion: Wheelchair: 0: Activity did not occur FIM - Locomotion: Ambulation Locomotion: Ambulation:  0: Activity did not occur  Comprehension Comprehension Mode: Auditory Comprehension: 5-Follows basic conversation/direction: With extra time/assistive device  Expression Expression Mode: Verbal Expression: 5-Expresses basic needs/ideas: With extra time/assistive device  Social Interaction Social Interaction: 6-Interacts appropriately with others with medication or extra time (anti-anxiety, antidepressant).  Problem Solving Problem Solving: 5-Solves basic 90% of the time/requires cueing < 10% of the time  Memory Memory: 5-Recognizes or recalls 90% of the time/requires cueing < 10% of the time  Medical Problem List and Plan:  1. 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 DVT Prophylaxis/Anticoagulation: Pharmaceutical: Lovenox  3. Chronic Pain Management: Continue MS contin 30 mg bid. Air bed for back and buttock pain as well as pressure relief measures.   Has non painful sensory parasthesias in Zone of Partial Preservation  LUE   -reminded him to use heating pad for shoulder/neck symptoms, good posture when in bed and chair  -  continues robaxin. Consider further TPI's this week---need to get him in a sitting position to palptate  -his clavicle fracture also in this general area with distraction laterally---denies any pain in this area.  -day time gabapentin decreased to help with grogginess. 4. Mood: reviewed anxiety with patient. Discussed triggers and ways to de-escalate. Also have added prn xanax if he is unable to manage it.  -encouraged grounds pass again 5. Neuropsych: This patient is capable of making decisions on his own behalf.  6. Spinal cord infarct: steroids off-   7. DM type 2 with gastroparesis:  Decrease PM lantus to 20units given persistent low cbg's , maintain am lantus at 14u. Now on 3u novolog units tid ac for meal coverage.    -will request endocrine consult today 8. ChronicBLE wounds with  chronically elevated ESR: continue doxycline for 30 days (started 02/02/14). Follow up with ID after discharge. Continue local wound care.   -right elbow granulating in  -wet-dry with allevyn over right popliteal area 9. Constipation/diabetic gastroparesis/neurogenic bowel: reglan causing cramping--dc . Bowel program - D/C miralx, cont colace and bisacodyl supp 10. Severe diabetic peripheral neuropathy:resume neurontin 122m bid an 4039m qhs   11. Pulmonary:  deep breathing, IS/FV, CPAP -quad cough, assistive techniques  -keep hob at 30 degrees  -chest PT 12. Low grade temp:   -labs ok ,cxr ok  -UA suspicious---pending micro   LOS (Days) 17 A FACE TO FACE EVALUATION WAS PERFORMED  ZaMeredith Staggers/18/2015, 8:38 AM

## 2014-02-22 NOTE — Progress Notes (Addendum)
Physical Therapy Note  Patient Details  Name: Jason [name] MRN: [id number] Date of Birth: November 19, 1985 Today's Date: 02/22/2014  Time: 1030-1130 60 minutes co tx with TR  1:1 Pt c/o B shoulder/UE pain, pain meds rec'd during session, rests and repositioning as appropriate.  W/c mobility in controlled environment 2 x 75' with increased time due to fatigue.  Sliding board transfers with emphasis on pt directing set up, pt requires min cues for set up, min A for transfer for wt shifts and blocking LEs, pt able to push with UEs to slide without assistance.  Sitting balance edge of mat, attempt to have pt reach for ball, pt unable due to UE pain.  Focused on sitting balance with narrow BOS, hands in lap or on knees.  Pt performed multiple attempts with min A to obtain position, able to hold up to 90 seconds.  Pt able to adjust balance with min A when he has LOB in sitting.  Pt with episode of feeling "Funny".  BP 84/57.  Pt reclined with LEs elevated, BP returned to 96/68.  Pt limited by pain in UEs throughout session, requires min/mod A with sitting balance but has improved to min A for transfers  Time: 1300-1330 30 minutes  1:1 No c/o pain at rest, c/o shoulder pain with activity, rest and repositioned appropriately.  Session focused on pt getting into circle sitting and maintaining balance in circle sit position to doff sock.  Pt able to position legs with leg loops with supervision, requires mod A to sit from 45 degree inclined bed.  Once in circle sitting pt able to maintain with close supervision during functional task.  Pt able to doff sock with min A for manipulating LE.  Pt performed stretching to back mm in circle sit position.  Supine rolling to B sides with pt able to perform using bedrails and leg loops with supervision.   [name] 02/22/2014, 11:33 AM

## 2014-02-22 NOTE — Progress Notes (Signed)
Occupational Therapy Session Note  Patient Details  Name: Jason [name] MRN: [id number] Date of Birth: 01/05/1986  Today's Date: 02/22/2014 Time: 1350-1430 Time Calculation (min): 40 min  Short Term Goals: Week 1:  OT Short Term Goal 1 (Week 1): Pt will tolerate sitting on EOB for 10 min with mod A. OT Short Term Goal 1 - Progress (Week 1): Progressing toward goal OT Short Term Goal 2 (Week 1): Pt will use bath mit to wash face and chest. OT Short Term Goal 2 - Progress (Week 1): Met OT Short Term Goal 3 (Week 1): Pt will use a built up spoon to self feed with min A. OT Short Term Goal 3 - Progress (Week 1): Met  Week 2:  OT Short Term Goal 1 (Week 2): Sitting Balance:  Max assist EOB for 10 min OT Short Term Goal 2 (Week 2): UB Dressing:  Patient will pull shirt down in the back with min assist for stability OT Short Term Goal 3 (Week 2): LB dressing:  Patient will donn pants over feet in long/circle sitting with min assist OT Short Term Goal 4 (Week 2): Grooming: patient will brush teeth sitting in w/c at sink with min assist for set up PRN OT Short Term Goal 5 (Week 2): Communication: Patient will demonstrate use of hospital phone with supervision  Skilled Therapeutic Interventions/Progress Updates:  Patient received supine in bed with complaints of bilateral shoulder pain, RN aware. Focused skilled intervention on bed mobility (patient using leg loops to increase independence), supine>sit, static sitting balance/tolerance/endurance for ~10 minutes, EOB>w/c slide board transfer, patient directing care prn, positioning in w/c, stretching > neck, and overall activity tolerance/endurance. At end of session, patient left seated in w/c with quick release belt donned and soft call bell within reach. [name] present entire session. Therapist re-iterated importance of patient directing care as needed.   Precautions:  Precautions Precautions: Fall Precaution Comments: sacral  breakdown-boost when in w/c, abdominal binder and TEDs due to low BP. Required Braces or Orthoses: Other Brace/Splint Other Brace/Splint: Bilateral Prevalon boots at night Restrictions Weight Bearing Restrictions: No  See FIM for current functional status  Therapy/Group: Individual Therapy   M  02/22/2014, 3:24 PM  

## 2014-02-22 NOTE — Significant Event (Signed)
Hypoglycemic Event  CBG: 50  Treatment: 15g snack  Symptoms: None  Follow-up CBG: Time:0710 CBG Result: 101  Possible Reasons for Event: Unknown  Comments/MD notified:yes    [name]  Remember to initiate Hypoglycemia Order Set & complete

## 2014-02-23 ENCOUNTER — Encounter (HOSPITAL_COMMUNITY): Payer: Self-pay | Admitting: Occupational Therapy

## 2014-02-23 ENCOUNTER — Telehealth: Payer: Self-pay | Admitting: Endocrinology

## 2014-02-23 ENCOUNTER — Inpatient Hospital Stay (HOSPITAL_COMMUNITY): Payer: Medicaid Other | Admitting: *Deleted

## 2014-02-23 ENCOUNTER — Inpatient Hospital Stay (HOSPITAL_COMMUNITY): Payer: Medicaid Other | Admitting: Physical Therapy

## 2014-02-23 ENCOUNTER — Encounter (HOSPITAL_COMMUNITY): Payer: Self-pay | Admitting: *Deleted

## 2014-02-23 ENCOUNTER — Inpatient Hospital Stay (HOSPITAL_COMMUNITY): Payer: Self-pay | Admitting: Occupational Therapy

## 2014-02-23 DIAGNOSIS — E291 Testicular hypofunction: Secondary | ICD-10-CM

## 2014-02-23 DIAGNOSIS — L02419 Cutaneous abscess of limb, unspecified: Secondary | ICD-10-CM

## 2014-02-23 DIAGNOSIS — K3184 Gastroparesis: Secondary | ICD-10-CM

## 2014-02-23 DIAGNOSIS — G8250 Quadriplegia, unspecified: Secondary | ICD-10-CM

## 2014-02-23 DIAGNOSIS — G9519 Other vascular myelopathies: Secondary | ICD-10-CM

## 2014-02-23 DIAGNOSIS — E1149 Type 2 diabetes mellitus with other diabetic neurological complication: Secondary | ICD-10-CM

## 2014-02-23 DIAGNOSIS — L03119 Cellulitis of unspecified part of limb: Secondary | ICD-10-CM

## 2014-02-23 LAB — BASIC METABOLIC PANEL
BUN: 38 mg/dL — ABNORMAL HIGH (ref 6–23)
CHLORIDE: 100 meq/L (ref 96–112)
CO2: 24 mEq/L (ref 19–32)
CREATININE: 0.97 mg/dL (ref 0.50–1.35)
Calcium: 9 mg/dL (ref 8.4–10.5)
Glucose, Bld: 351 mg/dL — ABNORMAL HIGH (ref 70–99)
Potassium: 4.7 mEq/L (ref 3.7–5.3)
Sodium: 134 mEq/L — ABNORMAL LOW (ref 137–147)

## 2014-02-23 LAB — GLUCOSE, CAPILLARY
GLUCOSE-CAPILLARY: 357 mg/dL — AB (ref 70–99)
Glucose-Capillary: 334 mg/dL — ABNORMAL HIGH (ref 70–99)
Glucose-Capillary: 112 mg/dL — ABNORMAL HIGH (ref 70–99)
Glucose-Capillary: 417 mg/dL — ABNORMAL HIGH (ref 70–99)

## 2014-02-23 MED ORDER — OXYCODONE HCL 5 MG PO TABS
5.0000 mg | ORAL_TABLET | ORAL | Status: DC | PRN
  Administered 2014-02-24 – 2014-03-05 (×24): 10 mg via ORAL
  Filled 2014-02-23 (×25): qty 2

## 2014-02-23 MED ORDER — LIDOCAINE HCL (PF) 1 % IJ SOLN
5.0000 mL | Freq: Once | INTRAMUSCULAR | Status: AC
  Administered 2014-02-23: 5 mL via INTRADERMAL
  Filled 2014-02-23 (×2): qty 5

## 2014-02-23 MED ORDER — INSULIN GLARGINE 100 UNIT/ML ~~LOC~~ SOLN
24.0000 [IU] | Freq: Every day | SUBCUTANEOUS | Status: DC
Start: 2014-02-23 — End: 2014-02-23
  Filled 2014-02-23: qty 0.24

## 2014-02-23 MED ORDER — INSULIN ASPART 100 UNIT/ML ~~LOC~~ SOLN: 0.0000 [IU] | Freq: Every day | SUBCUTANEOUS | Status: DC

## 2014-02-23 MED ORDER — LIDOCAINE HCL 1 % IJ SOLN
10.0000 mL | Freq: Once | INTRAMUSCULAR | Status: DC
  Administered 2014-02-23: 10 mL
  Filled 2014-02-23: qty 10

## 2014-02-23 MED ORDER — CAMPHOR-MENTHOL 0.5-0.5 % EX LOTN
TOPICAL_LOTION | Freq: Two times a day (BID) | CUTANEOUS | Status: DC
  Administered 2014-02-23 – 2014-03-04 (×10): via TOPICAL
  Filled 2014-02-23: qty 222

## 2014-02-23 MED ORDER — INSULIN ASPART 100 UNIT/ML ~~LOC~~ SOLN
0.0000 [IU] | Freq: Three times a day (TID) | SUBCUTANEOUS | Status: DC
  Administered 2014-02-23: 9 [IU] via SUBCUTANEOUS
  Administered 2014-02-23: 4 [IU] via SUBCUTANEOUS

## 2014-02-23 MED ORDER — INSULIN ASPART 100 UNIT/ML ~~LOC~~ SOLN
5.0000 [IU] | Freq: Three times a day (TID) | SUBCUTANEOUS | Status: DC
  Administered 2014-02-23 – 2014-02-28 (×14): 5 [IU] via SUBCUTANEOUS

## 2014-02-23 MED ORDER — PREGABALIN 50 MG PO CAPS
75.0000 mg | ORAL_CAPSULE | Freq: Two times a day (BID) | ORAL | Status: DC
  Administered 2014-02-23 – 2014-02-27 (×9): 75 mg via ORAL
  Filled 2014-02-23 (×18): qty 1

## 2014-02-23 MED ORDER — INSULIN GLARGINE 100 UNIT/ML ~~LOC~~ SOLN: 18.0000 [IU] | Freq: Two times a day (BID) | SUBCUTANEOUS | Status: DC

## 2014-02-23 MED ORDER — OXYCODONE HCL 5 MG PO TABS
10.0000 mg | ORAL_TABLET | Freq: Once | ORAL | Status: AC
Start: 2014-02-23 — End: 2014-02-23
  Administered 2014-02-23: 10 mg via ORAL
  Filled 2014-02-23: qty 2

## 2014-02-23 MED ORDER — INSULIN GLARGINE 100 UNIT/ML ~~LOC~~ SOLN
18.0000 [IU] | Freq: Every day | SUBCUTANEOUS | Status: DC
  Administered 2014-02-23 – 2014-02-25 (×3): 18 [IU] via SUBCUTANEOUS
  Filled 2014-02-23 (×6): qty 0.18

## 2014-02-23 NOTE — Consult Note (Addendum)
WOC follow-up: Right elbow full thickness wound without further slough.  10% bone visible, 90% beefy red. 3X6X.1cm. Mod amt green drainage, no odor.  Discussed plan of care with pt and mother at bedside.  They are aware that constant movement over joint can impair healing.  Plan: Discontinue Santyl since chemical debridement no longer necessary at this time.  Begin Aquacel to absorb drainage and provide antimicrobial benefits.  Please re-consult if further assistance is needed.  Thank-you,  Julien Girt MSN, [place], [place], [place], [place]

## 2014-02-23 NOTE — Telephone Encounter (Signed)
Olin Hauser at [employer] for some medication assistance would we be able to offer anything to them regarding this pt. Olin Hauser is asking if Dr. Loanne Drilling could just look at the pt's chart and give them some info

## 2014-02-23 NOTE — Progress Notes (Signed)
NUTRITION FOLLOW UP DOCUMENTATION CODES  Per approved criteria   -Severe malnutrition in the context of chronic illness    Intervention:   1. General healthful diet; encourage intake of foods and beverages as able. RD to follow and assess for nutritional adequacy.   NUTRITION DIAGNOSIS:  Inadequate oral intake related to poor appetite as evidenced by pt report.   Monitor:  1. Food/Beverage; pt meeting >/=90% estimated needs with tolerance.  2. Wt/wt change; monitor trends  Assessment:   Pt with h/o Type 1 diabetes, diabetic neuropathy with sensory deficits, as well as BLE diabetic ulcers, as well as gastroparesis admitted with lethargy and inability to ambulate. 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 Spinal cord infarct suspected at this time.   Pt continues to eat well completing 75-100% of meals consistently.   RD was informed by RN that pt may have expressed interest in better caring for diabetes at home.  Pt unavailable at time of visit today.  RD will continue to attempt to meet with pt and family for assessment of potential education needs.  Pt is currently eating well without need for additional interventions or supplements at this time.   Note intermittent episodes of hypoglycemia.   Height: Ht Readings from Last 1 Encounters:  01/30/14 5' 7.5" (1.715 m)    Weight Status:   Wt Readings from Last 1 Encounters:  02/23/14 127 lb 11.2 oz (57.924 kg)    Re-estimated needs:  Kcal: 1800-2000  Protein: 70-80 grams  Fluid: 2 L/day  Skin: non-pitting edema  Diet Order: Carb Control   Intake/Output Summary (Last 24 hours) at 02/23/14 1201 Last data filed at 02/23/14 0900  Gross per 24 hour  Intake    600 ml  Output   1100 ml  Net   -500 ml    Last BM: 5/18  Labs:   Recent Labs Lab 02/17/14 0950 02/20/14 2142 02/21/14 0605 02/23/14 0750  NA 141  --  136* 134*  K 4.3  --  4.3 4.7  CL 105  --  100 100  CO2 27  --  26 24  BUN 29*  --  36*  38*  CREATININE 0.88  --  0.82 0.97  CALCIUM 9.0  --  9.2 9.0  GLUCOSE 121* 489* 56* 351*    CBG (last 3)   Recent Labs  02/22/14 2101 02/23/14 0744 02/23/14 1120  GLUCAP 237* 334* 112*    Scheduled Meds: . bisacodyl  10 mg Rectal Q2000  . chlorhexidine  15 mL Mouth Rinse BID  . docusate sodium  100 mg Oral BID  . doxycycline  100 mg Oral Q12H  . enoxaparin (LOVENOX) injection  40 mg Subcutaneous Q24H  . insulin aspart  0-9 Units Subcutaneous TID WC  . insulin aspart  3 Units Subcutaneous TID WC  . insulin aspart  3-6 Units Subcutaneous QHS  . insulin glargine  15 Units Subcutaneous Daily  . insulin glargine  24 Units Subcutaneous QHS  . lidocaine  1 patch Transdermal Q24H  . loratadine  10 mg Oral Daily  . morphine  30 mg Oral Q12H  . pregabalin  75 mg Oral BID  . saccharomyces boulardii  250 mg Oral BID    Continuous Infusions:   Brynda Greathouse, MS RD LDN Clinical Inpatient Dietitian Pager: [phone] Weekend/After hours pager: [phone]

## 2014-02-23 NOTE — Progress Notes (Signed)
Physical Therapy Session Note  Patient Details  Name: Jason [name] MRN: [id number] Date of Birth: 04/26/1986  Today's Date: 02/23/2014 Time: 1520-1605 Time Calculation (min): 45 min  Short Term Goals: Week 2:  PT Short Term Goal 1 (Week 2): Pt will perform functional transfers with max A of 1 PT Short Term Goal 1 - Progress (Week 2): Met PT Short Term Goal 2 (Week 2): Pt will tolerate sitting edge of bed for functional activities x 5 minutes with mod A PT Short Term Goal 2 - Progress (Week 2): Progressing toward goal PT Short Term Goal 3 (Week 2): pt will propel w/c with supervision x 50' in controlled environment PT Short Term Goal 3 - Progress (Week 2): Met  Skilled Therapeutic Interventions/Progress Updates:   Session focused on dynamic sitting balance and initiating standing frame. Patient received reclined in w/c in gym, PT tech present for session. Sliding board transfer w/c <> level mat with emphasis on pt directing set up, min A for transfer for wt shifts and blocking LEs, pt able to push with UEs to slide without assistance. Dynamic sitting balance seated EOM, patient reaching laterally/anteriorly to take ring and place on hook with 1 UE support and min-mod A for balance reaching outside BOS. Patient preferring to lean posteriorly on BUEs with report of increased buttocks pain from "sphincter being active" with reaching forward. Patient agreeable to attempting standing frame for buttocks pain relief and standing tolerance. Sitting BP = 109/79. Standing frame x 3 min with BUE support and therapist facilitating trunk/hip alignment. Unable to obtain BP in standing. Pt requesting to sit due to back pain. Seated after standing BP = 96/60 and patient non-symptomatic. Patient returned to room and left reclined in w/c with family present.   Therapy Documentation Precautions:  Precautions Precautions: Fall Precaution Comments: sacral breakdown-boost when in w/c, abdominal binder and TEDs  due to low BP. Required Braces or Orthoses: Other Brace/Splint Other Brace/Splint: Bilateral Prevalon boots at night Restrictions Weight Bearing Restrictions: No Vital Signs: Therapy Vitals Temp: 98 F (36.7 C) Temp src: Oral Pulse Rate: 93 Resp: 17 BP: 96/60 mmHg Patient Position (if appropriate): Sitting (after standing frame with therapy) Oxygen Therapy SpO2: 99 % O2 Device: None (Room air)  See FIM for current functional status  Therapy/Group: Individual Therapy  Laretta Alstrom 02/23/2014, 5:20 PM

## 2014-02-23 NOTE — Telephone Encounter (Signed)
Please also advise that this is a 1-time, and i cannot offer this service on an ongoing basis.

## 2014-02-23 NOTE — Progress Notes (Signed)
Physical Therapy Weekly Progress Note  Patient Details  Name: Jason [name] MRN: [id number] Date of Birth: 1986/02/03  Beginning of progress report period: Feb 16, 2014 End of progress report period: Feb 23, 2014  Today's Date: 02/23/2014  Patient has met 2 of 3 short term goals.  Pt has improved to min/mod A for sliding board transfers with min cues for set up.  Pt min/mod A for bed mobility.  Pt continues with low tolerance for sitting edge of bed due to pain and light headedness.    Patient continues to demonstrate the following deficits: impaired balance, increased pain, decreased activity tolerance and therefore will continue to benefit from skilled PT intervention to enhance overall performance with activity tolerance, balance, postural control, ability to compensate for deficits, functional use of  right upper extremity and left upper extremity and coordination.  Patient progressing toward long term goals..  Continue plan of care.  PT Short Term Goals Week 2:  PT Short Term Goal 1 (Week 2): Pt will perform functional transfers with max A of 1 PT Short Term Goal 1 - Progress (Week 2): Met PT Short Term Goal 2 (Week 2): Pt will tolerate sitting edge of bed for functional activities x 5 minutes with mod A PT Short Term Goal 2 - Progress (Week 2): Progressing toward goal PT Short Term Goal 3 (Week 2): pt will propel w/c with supervision x 50' in controlled environment PT Short Term Goal 3 - Progress (Week 2): Met Week 3:  PT Short Term Goal 1 (Week 3): pt will self direct set up for sliding board transfers independently PT Short Term Goal 2 (Week 3): pt will tolerate sitting edge of bed for functional activity x 5 minutes with min A PT Short Term Goal 3 (Week 3): pt will perform bed mobility with min A  Skilled Therapeutic Interventions/Progress Updates:  Balance/vestibular training;DME/adaptive equipment instruction;Pain management;Patient/family education;Therapeutic  Activities;UE/LE Coordination activities;UE/LE Strength taining/ROM;Splinting/orthotics;Therapeutic Exercise;Wheelchair propulsion/positioning;Neuromuscular re-education;Functional electrical stimulation;Discharge planning;Functional mobility training     See FIM for current functional status    [name] 02/23/2014, 7:56 AM

## 2014-02-23 NOTE — Telephone Encounter (Signed)
See below, I called Olin Hauser at . She states she would like for you to look in the pt's chart at the recent lab work and advise on what should be done about DM management. Informed her that the pt has not been seen in a while and that an appointment is needed. Pt is currently in the hospital and could not come for an appointment any time soon.   Please advise, Thanks!

## 2014-02-23 NOTE — Progress Notes (Signed)
Occupational Therapy Session Notes  Patient Details  Name: Jason [name] MRN: [id number] Date of Birth: Apr 16, 1986  Today's Date: 02/23/2014  Short Term Goals: Week 1:  OT Short Term Goal 1 (Week 1): Pt will tolerate sitting on EOB for 10 min with mod A. OT Short Term Goal 1 - Progress (Week 1): Progressing toward goal OT Short Term Goal 2 (Week 1): Pt will use bath mit to wash face and chest. OT Short Term Goal 2 - Progress (Week 1): Met OT Short Term Goal 3 (Week 1): Pt will use a built up spoon to self feed with min A. OT Short Term Goal 3 - Progress (Week 1): Met  Week 2:  OT Short Term Goal 1 (Week 2): Sitting Balance:  Max assist EOB for 10 min OT Short Term Goal 2 (Week 2): UB Dressing:  Patient will pull shirt down in the back with min assist for stability OT Short Term Goal 3 (Week 2): LB dressing:  Patient will donn pants over feet in long/circle sitting with min assist OT Short Term Goal 4 (Week 2): Grooming: patient will brush teeth sitting in w/c at sink with min assist for set up PRN OT Short Term Goal 5 (Week 2): Communication: Patient will demonstrate use of hospital phone with supervision  Skilled Therapeutic Interventions/Progress Updates:   Session #1 [id number] - 55 Minutes Individual Therapy Patient with complaints of pain in bilateral shoulders, RN aware Patient received supine in bed with socks, TEDs, and leg loops donned. Patient engaged in bed mobility in order to perform LB dressing tasks. Patient used bilateral leg loops in order to increase independence with BLE management and LB dressing. Patient stated he completed UB dressing while in bed. Patient engaged in bed mobility from here, using leg loops, and sat EOB with minimal assistance. From here, patient transferred EOB>w/c using slide board with minimal assistance. Patient worked on directing care prn and appropriately. Once in w/c patient became unresponsive. Therapist immediately tilted patient back,  elevated BLEs, and donned abdominal binder. Patient became responsive and BP checked =112/71. Patient stated he felt slightly dizzy and was "exhausted". Therapist donned quick release belt and left patient seated in w/c, slightly tilted with all needs within reach. RN made aware of unresponsive episode.   Session #2 [id number] - 30 Minutes Individual Therapy No complaints of direct pain Patient received seated in w/c in room. Patient propelled patient > therapy gym for therapeutic activity focusing on functional use of bilateral hands/tenodesis grasp, stretching > neck, education on low BP, and overall activity tolerance/endurance. Patient with visitor at end of session, therapist left patient seated in w/c in gym waiting on PT for next therapy session.   Precautions:  Precautions Precautions: Fall Precaution Comments: sacral breakdown-boost when in w/c, abdominal binder and TEDs due to low BP. Required Braces or Orthoses: Other Brace/Splint Other Brace/Splint: Bilateral Prevalon boots at night Restrictions Weight Bearing Restrictions: No  See FIM for current functional status  Therapy/Group: Individual Therapy  Estelle June 02/23/2014, 7:25 AM

## 2014-02-23 NOTE — Progress Notes (Signed)
Subjective/Complaints: Lower neck,shoulder girdle still sore. CBG high this am. A 12 point review of systems has been performed and if not noted above is otherwise negative.   Objective: Vital Signs: Blood pressure 112/71, pulse 94, temperature 98 F (36.7 C), temperature source Oral, resp. rate 18, weight 57.924 kg (127 lb 11.2 oz), SpO2 100.00%. No results found. Results for orders placed during the hospital encounter of 02/05/14 (from the past 72 hour(s))  GLUCOSE, CAPILLARY     Status: Abnormal   Collection Time    02/20/14 11:39 AM      Result Value Ref Range   Glucose-Capillary 170 (*) 70 - 99 mg/dL  URINE CULTURE     Status: None   Collection Time    02/20/14  1:58 PM      Result Value Ref Range   Specimen Description URINE, CATHETERIZED     Special Requests NONE     Culture  Setup Time       Value: 02/20/2014 21:18     Performed at [facility]       Value: NO GROWTH     Performed at [facility]   Culture       Value: NO GROWTH     Performed at [facility]   Report Status 02/22/2014 FINAL    URINALYSIS, ROUTINE W REFLEX MICROSCOPIC     Status: Abnormal   Collection Time    02/20/14  1:58 PM      Result Value Ref Range   Color, Urine YELLOW  YELLOW   APPearance CLEAR  CLEAR   Specific Gravity, Urine 1.017  1.005 - 1.030   pH 6.0  5.0 - 8.0   Glucose, UA NEGATIVE  NEGATIVE mg/dL   Hgb urine dipstick MODERATE (*) NEGATIVE   Bilirubin Urine NEGATIVE  NEGATIVE   Ketones, ur NEGATIVE  NEGATIVE mg/dL   Protein, ur 30 (*) NEGATIVE mg/dL   Urobilinogen, UA 0.2  0.0 - 1.0 mg/dL   Nitrite NEGATIVE  NEGATIVE   Leukocytes, UA NEGATIVE  NEGATIVE  URINE MICROSCOPIC-ADD ON     Status: None   Collection Time    02/20/14  1:58 PM      Result Value Ref Range   Squamous Epithelial / LPF RARE  RARE   WBC, UA 0-2  <3 WBC/hpf   RBC / HPF 7-10  <3 RBC/hpf   Bacteria, UA RARE  RARE  GLUCOSE, CAPILLARY     Status: Abnormal    Collection Time    02/20/14  4:50 PM      Result Value Ref Range   Glucose-Capillary 228 (*) 70 - 99 mg/dL   Comment 1 Notify RN    GLUCOSE, CAPILLARY     Status: Abnormal   Collection Time    02/20/14  8:33 PM      Result Value Ref Range   Glucose-Capillary 444 (*) 70 - 99 mg/dL  GLUCOSE, RANDOM     Status: Abnormal   Collection Time    02/20/14  9:42 PM      Result Value Ref Range   Glucose, Bld 489 (*) 70 - 99 mg/dL  GLUCOSE, CAPILLARY     Status: Abnormal   Collection Time    02/21/14 12:30 AM      Result Value Ref Range   Glucose-Capillary 476 (*) 70 - 99 mg/dL  CBC     Status: Abnormal   Collection Time    02/21/14  6:05 AM  Result Value Ref Range   WBC 14.1 (*) 4.0 - 10.5 K/uL   RBC 2.93 (*) 4.22 - 5.81 MIL/uL   Hemoglobin 7.4 (*) 13.0 - 17.0 g/dL   HCT 23.4 (*) 39.0 - 52.0 %   MCV 79.9  78.0 - 100.0 fL   MCH 25.3 (*) 26.0 - 34.0 pg   MCHC 31.6  30.0 - 36.0 g/dL   RDW 16.1 (*) 11.5 - 15.5 %   Platelets 455 (*) 150 - 400 K/uL  BASIC METABOLIC PANEL     Status: Abnormal   Collection Time    02/21/14  6:05 AM      Result Value Ref Range   Sodium 136 (*) 137 - 147 mEq/L   Potassium 4.3  3.7 - 5.3 mEq/L   Chloride 100  96 - 112 mEq/L   CO2 26  19 - 32 mEq/L   Glucose, Bld 56 (*) 70 - 99 mg/dL   BUN 36 (*) 6 - 23 mg/dL   Creatinine, Ser 0.82  0.50 - 1.35 mg/dL   Calcium 9.2  8.4 - 10.5 mg/dL   GFR calc non Af Amer >90  >90 mL/min   GFR calc Af Amer >90  >90 mL/min   Comment: (NOTE)     The eGFR has been calculated using the CKD EPI equation.     This calculation has not been validated in all clinical situations.     eGFR's persistently <90 mL/min signify possible Chronic Kidney     Disease.  GLUCOSE, CAPILLARY     Status: Abnormal   Collection Time    02/21/14  6:35 AM      Result Value Ref Range   Glucose-Capillary 38 (*) 70 - 99 mg/dL  GLUCOSE, CAPILLARY     Status: Abnormal   Collection Time    02/21/14  7:00 AM      Result Value Ref Range    Glucose-Capillary 50 (*) 70 - 99 mg/dL  GLUCOSE, CAPILLARY     Status: Abnormal   Collection Time    02/21/14  7:32 AM      Result Value Ref Range   Glucose-Capillary 224 (*) 70 - 99 mg/dL  GLUCOSE, CAPILLARY     Status: Abnormal   Collection Time    02/21/14 11:29 AM      Result Value Ref Range   Glucose-Capillary 377 (*) 70 - 99 mg/dL  GLUCOSE, CAPILLARY     Status: Abnormal   Collection Time    02/21/14  4:51 PM      Result Value Ref Range   Glucose-Capillary 209 (*) 70 - 99 mg/dL  GLUCOSE, CAPILLARY     Status: Abnormal   Collection Time    02/21/14  9:26 PM      Result Value Ref Range   Glucose-Capillary 143 (*) 70 - 99 mg/dL   Comment 1 Notify RN    GLUCOSE, CAPILLARY     Status: Abnormal   Collection Time    02/22/14  6:39 AM      Result Value Ref Range   Glucose-Capillary 50 (*) 70 - 99 mg/dL  GLUCOSE, CAPILLARY     Status: Abnormal   Collection Time    02/22/14  7:14 AM      Result Value Ref Range   Glucose-Capillary 101 (*) 70 - 99 mg/dL   Comment 1 Notify RN    GLUCOSE, CAPILLARY     Status: Abnormal   Collection Time    02/22/14 11:35 AM  Result Value Ref Range   Glucose-Capillary 133 (*) 70 - 99 mg/dL  GLUCOSE, CAPILLARY     Status: Abnormal   Collection Time    02/22/14  4:18 PM      Result Value Ref Range   Glucose-Capillary 248 (*) 70 - 99 mg/dL  GLUCOSE, CAPILLARY     Status: Abnormal   Collection Time    02/22/14  9:01 PM      Result Value Ref Range   Glucose-Capillary 237 (*) 70 - 99 mg/dL  GLUCOSE, CAPILLARY     Status: Abnormal   Collection Time    02/23/14  7:44 AM      Result Value Ref Range   Glucose-Capillary 334 (*) 70 - 99 mg/dL     HEENT: normal Cardio: RRR and no murmur Resp: CTA B/L and unlabored GI: BS positive and mildly distended and tympanitic, mild tenderness Extremity:  No Edema Skin:  . Neuro: Alert/Oriented, Abnormal Sensory absent in feet and Abnormal Motor 0/5 in LE,0/5 finger extension and 0/5 hand intrinsic.  Wrist extension 2-, 3/5 tricep   2, biceps 5/5. Decreased sensation below elbows bilaterally but able to ID LT in Left C6-7 dermatomes. ?trace HF on right. Musc/Skel:  Generalized shoulder girdle tenderness. Clavicle non-tender Gen NAD   Assessment/Plan: 1. 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 Physiatrist is providing close team supervision and 24 hour management of active medical problems listed below. Physiatrist and rehab team continue to assess barriers to discharge/monitor patient progress toward functional and medical goals.   FIM: FIM - Bathing Bathing Steps Patient Completed: Chest;Abdomen Bathing:  (performed with his mother)  FIM - Upper Body Dressing/Undressing Upper body dressing/undressing steps patient completed: Thread/unthread right sleeve of pullover shirt/dresss;Thread/unthread left sleeve of pullover shirt/dress;Put head through opening of pull over shirt/dress Upper body dressing/undressing: 4: Min-Patient completed 75 plus % of tasks FIM - Lower Body Dressing/Undressing Lower body dressing/undressing steps patient completed: Thread/unthread right pants leg;Thread/unthread left pants leg;Pull pants up/down;Don/Doff right sock;Don/Doff left sock Lower body dressing/undressing: 1: Total-Patient completed less than 25% of tasks  FIM - Toileting Toileting: 0: Activity did not occur  FIM - Air cabin crew Transfers: 0-Activity did not occur  FIM - Control and instrumentation engineer Devices: HOB elevated;Bed rails Bed/Chair Transfer: 4: Chair or W/C > Bed: Min A (steadying Pt. > 75%);4: Bed > Chair or W/C: Min A (steadying Pt. > 75%)  FIM - Locomotion: Wheelchair Distance: 50 Locomotion: Wheelchair: 2: Travels 13 - 149 ft with supervision, cueing or coaxing FIM - Locomotion: Ambulation Locomotion: Ambulation: 0: Activity did not  occur  Comprehension Comprehension Mode: Auditory Comprehension: 7-Follows complex conversation/direction: With no assist  Expression Expression Mode: Verbal Expression: 7-Expresses complex ideas: With no assist  Social Interaction Social Interaction: 6-Interacts appropriately with others with medication or extra time (anti-anxiety, antidepressant).  Problem Solving Problem Solving: 5-Solves basic 90% of the time/requires cueing < 10% of the time  Memory Memory: 5-Recognizes or recalls 90% of the time/requires cueing < 10% of the time  Medical Problem List and Plan:  1. 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 DVT Prophylaxis/Anticoagulation: Pharmaceutical: Lovenox  3. Chronic Pain Management: Continue MS contin 30 mg bid. Air bed for back and buttock pain as well as pressure relief measures.   Has non painful sensory parasthesias in Zone of Partial Preservation  LUE   -reminded  him to use heating pad for shoulder/neck symptoms, good posture when in bed and chair  -continues robaxin. Consider further TPI's---?later today  -his clavicle fracture also in this general area with distraction laterally---denies any pain in this area.  -will change gabapentin to lyrica per pt request to see if he has better efficacy 4. Mood: reviewed anxiety with patient. Discussed triggers and ways to de-escalate. Also have added prn xanax if he is unable to manage it.  -encouraged grounds pass again 5. Neuropsych: This patient is capable of making decisions on his own behalf.  6. Spinal cord infarct: steroids off-   7. DM type 2 with gastroparesis: decreased PM lantus to 20units given persistent low cbg's---and then cbg 330 this am!!!! , am lantus at 14u. Now on 3u novolog units tid ac for meal coverage.  Will adjust pm lantus up slightly  -requested endocrine consult 8. ChronicBLE wounds with chronically elevated ESR: continue  doxycline for 30 days (started 02/02/14). Follow up with ID after discharge. Continue local wound care.   -right elbow granulating in  -wet-dry with allevyn over right popliteal area 9. Constipation/diabetic gastroparesis/neurogenic bowel: reglan causing cramping--dc . Bowel program - D/C miralx, cont colace and bisacodyl supp 10. Severe diabetic peripheral neuropathy:resume neurontin 162m bid an 4065m qhs   11. Pulmonary:  deep breathing, IS/FV, CPAP -quad cough, assistive techniques  -keep hob at 30 degrees  -chest PT 12. Low grade temp:   -labs ok ,cxr ok  -UA suspicious---micro negative   LOS (Days) 18 A FACE TO FACE EVALUATION WAS PERFORMED  ZaMeredith Staggers/19/2015, 8:27 AM

## 2014-02-23 NOTE — Progress Notes (Signed)
Physical Therapy Note  Patient Details  Name: Jason [name] MRN: [id number] Date of Birth: July 15, 1986 Today's Date: 02/23/2014  Time: 1100-1140 40 minutes  1:1 Pt c/o B shoulder pain, RN aware.  Pt reports episode of "passing out" with OT in prior session due to low BP.  Pt agreeable to participate in PT treatment. Pt performed sliding board transfer with min A to mat.  Seated edge of mat BP 72/48.  Pt reclined and LEs elevated. BP returned to 99/69.  PT noted pt to be clammy, decreased arousal.  Blood glucose checked, 112.  RN present to give pain meds, states she prefers pt to be reclined with LEs elevated so BP does not drop due to pain meds.  Pt assisted back to w/c with sliding board, min A.  Positioned in room with chair reclined and LEs elevated, BP 98/70.  Pt missed 20 minutes due to low BP.   [name] 02/23/2014, 11:41 AM

## 2014-02-23 NOTE — Telephone Encounter (Signed)
advise increase scheduled novolog to 5 units 3 times a day (just before each meal), and Reduce lantus to 18 units qhs.

## 2014-02-23 NOTE — Telephone Encounter (Signed)
Pamela informed

## 2014-02-24 ENCOUNTER — Inpatient Hospital Stay (HOSPITAL_COMMUNITY): Payer: Medicaid Other | Admitting: Occupational Therapy

## 2014-02-24 ENCOUNTER — Inpatient Hospital Stay (HOSPITAL_COMMUNITY): Payer: Medicaid Other | Admitting: Physical Therapy

## 2014-02-24 ENCOUNTER — Inpatient Hospital Stay (HOSPITAL_COMMUNITY): Payer: Self-pay | Admitting: Occupational Therapy

## 2014-02-24 DIAGNOSIS — K3184 Gastroparesis: Secondary | ICD-10-CM

## 2014-02-24 DIAGNOSIS — G9519 Other vascular myelopathies: Secondary | ICD-10-CM

## 2014-02-24 DIAGNOSIS — L02419 Cutaneous abscess of limb, unspecified: Secondary | ICD-10-CM

## 2014-02-24 DIAGNOSIS — E291 Testicular hypofunction: Secondary | ICD-10-CM

## 2014-02-24 DIAGNOSIS — L03119 Cellulitis of unspecified part of limb: Secondary | ICD-10-CM

## 2014-02-24 DIAGNOSIS — E1149 Type 2 diabetes mellitus with other diabetic neurological complication: Secondary | ICD-10-CM

## 2014-02-24 DIAGNOSIS — G8250 Quadriplegia, unspecified: Secondary | ICD-10-CM

## 2014-02-24 LAB — GLUCOSE, CAPILLARY
GLUCOSE-CAPILLARY: 85 mg/dL (ref 70–99)
GLUCOSE-CAPILLARY: 117 mg/dL — AB (ref 70–99)
GLUCOSE-CAPILLARY: 130 mg/dL — AB (ref 70–99)
Glucose-Capillary: 154 mg/dL — ABNORMAL HIGH (ref 70–99)
Glucose-Capillary: 307 mg/dL — ABNORMAL HIGH (ref 70–99)
Glucose-Capillary: 58 mg/dL — ABNORMAL LOW (ref 70–99)
Glucose-Capillary: 62 mg/dL — ABNORMAL LOW (ref 70–99)

## 2014-02-24 MED ORDER — INSULIN ASPART 100 UNIT/ML ~~LOC~~ SOLN: 0.0000 [IU] | Freq: Every day | SUBCUTANEOUS | Status: DC

## 2014-02-24 MED ORDER — WHITE PETROLATUM GEL
Status: AC
  Administered 2014-02-24: 12:00:00
  Filled 2014-02-24: qty 5

## 2014-02-24 MED ORDER — MIDODRINE HCL 2.5 MG PO TABS
2.5000 mg | ORAL_TABLET | Freq: Two times a day (BID) | ORAL | Status: DC
  Administered 2014-02-24 – 2014-03-01 (×10): 2.5 mg via ORAL
  Filled 2014-02-24 (×13): qty 1

## 2014-02-24 MED ORDER — INSULIN ASPART 100 UNIT/ML ~~LOC~~ SOLN
0.0000 [IU] | Freq: Three times a day (TID) | SUBCUTANEOUS | Status: DC
  Administered 2014-02-25: 2 [IU] via SUBCUTANEOUS
  Administered 2014-02-25: 4 [IU] via SUBCUTANEOUS
  Administered 2014-02-26: 6 [IU] via SUBCUTANEOUS
  Administered 2014-02-26 – 2014-02-27 (×3): 2 [IU] via SUBCUTANEOUS

## 2014-02-24 NOTE — Progress Notes (Signed)
Inpatient Diabetes Program Recommendations  AACE/ADA: New Consensus Statement on Inpatient Glycemic Control (2013)  Target Ranges:  Prepandial:   less than 140 mg/dL      Peak postprandial:   less than 180 mg/dL (1-2 hours)      Critically ill patients:  140 - 180 mg/dL   Inpatient Diabetes Program Recommendations Insulin - Basal: Please decrease lantus to 15 units-fastings are in low 100's to <100's and some < 70-80 mg/dL Correction (SSI): Increase correction to moderate tidwc Insulin - Meal Coverage: Decrease to 3 units tidwc Diet: xxxxxxxxxxxxxxxxxx  Thank you, Rosita Kea, RN, CNS, Diabetes Coordinator [phone])

## 2014-02-24 NOTE — Progress Notes (Signed)
Social Work Patient ID: Jason [name], male   DOB: [birth year], 28 y.o.   MRN: [id number]  Met with pt and mother yesterday following team conference.  Reviewed team report info.  Both pleased with gains so far and feel that bowel program is coming together.  Discussed DME recommended at this point and f/u services.  Mother aware that pt's Medicaid is still being confirmed (fixing previous problem with having two MA #s issued) and hope to have this fixed prior to d/c.  Mother remains supportive and pt still motivated.  Focusing more on family ed as we near d/c end of next week.  Lennart Pall, LCSW

## 2014-02-24 NOTE — Progress Notes (Signed)
Inpatient Diabetes Program Recommendations  AACE/ADA: New Consensus Statement on Inpatient Glycemic Control (2013)  Target Ranges:  Prepandial:   less than 140 mg/dL      Peak postprandial:   less than 180 mg/dL (1-2 hours)      Critically ill patients:  140 - 180 mg/dL   Inpatient Diabetes Program Recommendations Insulin - Basal: Please decrease lantus to 15 units-fastings are in low 100's to <100's and some < 70-80 mg/dL Correction (SSI): Increase correction to moderate tidwc Insulin - Meal Coverage: Decrease to 3 units tidwc Diet: xxxxxxxxxxxxxxxxxx  Thank you, Dorlene Footman, RN, CNS, Diabetes Coordinator ([phone])    

## 2014-02-24 NOTE — Progress Notes (Addendum)
Per Dr. Cordelia Pen recommendations--lantus decreased to once a day at 18 units q hs and 5 units with meals. Will monitor for trends. Custom SSI set. Patient with BS 417 at bedtime last night and received 2 units novolog for coverage with drop to 58 this am. He's very hard to control. Will add additional BS check at 3 am daily to monitor for am drop and treat according.

## 2014-02-24 NOTE — Progress Notes (Signed)
Subjective/Complaints: lyrica was helpful. Hands feel better. TPI's also beneficial for neck/upper back pain A 12 point review of systems has been performed and if not noted above is otherwise negative.   Objective: Vital Signs: Blood pressure 107/71, pulse 92, temperature 97.7 F (36.5 C), temperature source Axillary, resp. rate 18, weight 57.924 kg (127 lb 11.2 oz), SpO2 97.00%. No results found. Results for orders placed during the hospital encounter of 02/05/14 (from the past 72 hour(s))  GLUCOSE, CAPILLARY     Status: Abnormal   Collection Time    02/21/14  7:32 AM      Result Value Ref Range   Glucose-Capillary 224 (*) 70 - 99 mg/dL  GLUCOSE, CAPILLARY     Status: Abnormal   Collection Time    02/21/14 11:29 AM      Result Value Ref Range   Glucose-Capillary 377 (*) 70 - 99 mg/dL  GLUCOSE, CAPILLARY     Status: Abnormal   Collection Time    02/21/14  4:51 PM      Result Value Ref Range   Glucose-Capillary 209 (*) 70 - 99 mg/dL  GLUCOSE, CAPILLARY     Status: Abnormal   Collection Time    02/21/14  9:26 PM      Result Value Ref Range   Glucose-Capillary 143 (*) 70 - 99 mg/dL   Comment 1 Notify RN    GLUCOSE, CAPILLARY     Status: Abnormal   Collection Time    02/22/14  6:39 AM      Result Value Ref Range   Glucose-Capillary 50 (*) 70 - 99 mg/dL  GLUCOSE, CAPILLARY     Status: Abnormal   Collection Time    02/22/14  7:14 AM      Result Value Ref Range   Glucose-Capillary 101 (*) 70 - 99 mg/dL   Comment 1 Notify RN    GLUCOSE, CAPILLARY     Status: Abnormal   Collection Time    02/22/14 11:35 AM      Result Value Ref Range   Glucose-Capillary 133 (*) 70 - 99 mg/dL  GLUCOSE, CAPILLARY     Status: Abnormal   Collection Time    02/22/14  4:18 PM      Result Value Ref Range   Glucose-Capillary 248 (*) 70 - 99 mg/dL  GLUCOSE, CAPILLARY     Status: Abnormal   Collection Time    02/22/14  9:01 PM      Result Value Ref Range   Glucose-Capillary 237 (*) 70 - 99  mg/dL  GLUCOSE, CAPILLARY     Status: Abnormal   Collection Time    02/23/14  7:44 AM      Result Value Ref Range   Glucose-Capillary 334 (*) 70 - 99 mg/dL  BASIC METABOLIC PANEL     Status: Abnormal   Collection Time    02/23/14  7:50 AM      Result Value Ref Range   Sodium 134 (*) 137 - 147 mEq/L   Potassium 4.7  3.7 - 5.3 mEq/L   Chloride 100  96 - 112 mEq/L   CO2 24  19 - 32 mEq/L   Glucose, Bld 351 (*) 70 - 99 mg/dL   BUN 38 (*) 6 - 23 mg/dL   Creatinine, Ser 0.97  0.50 - 1.35 mg/dL   Calcium 9.0  8.4 - 10.5 mg/dL   GFR calc non Af Amer >90  >90 mL/min   GFR calc Af Amer >90  >90 mL/min  Comment: (NOTE)     The eGFR has been calculated using the CKD EPI equation.     This calculation has not been validated in all clinical situations.     eGFR's persistently <90 mL/min signify possible Chronic Kidney     Disease.  GLUCOSE, CAPILLARY     Status: Abnormal   Collection Time    02/23/14 11:20 AM      Result Value Ref Range   Glucose-Capillary 112 (*) 70 - 99 mg/dL  GLUCOSE, CAPILLARY     Status: Abnormal   Collection Time    02/23/14  4:06 PM      Result Value Ref Range   Glucose-Capillary 357 (*) 70 - 99 mg/dL  GLUCOSE, CAPILLARY     Status: Abnormal   Collection Time    02/23/14  9:29 PM      Result Value Ref Range   Glucose-Capillary 417 (*) 70 - 99 mg/dL  GLUCOSE, CAPILLARY     Status: Abnormal   Collection Time    02/24/14 12:04 AM      Result Value Ref Range   Glucose-Capillary 307 (*) 70 - 99 mg/dL     HEENT: normal Cardio: RRR and no murmur Resp: CTA B/L and unlabored GI: BS positive and mildly distended and tympanitic, mild tenderness Extremity:  No Edema Skin:  . Neuro: Alert/Oriented, Abnormal Sensory absent in feet and Abnormal Motor 0/5 in LE,0/5 finger extension and 0/5 hand intrinsic. Wrist extension 2-, 3/5 tricep   2, biceps 5/5. Decreased sensation below elbows bilaterally but able to ID LT in Left C6-7 dermatomes. ?trace HF on  right. Musc/Skel:  Generalized shoulder girdle tenderness. Clavicle non-tender Gen NAD   Assessment/Plan: 1. 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 Physiatrist is providing close team supervision and 24 hour management of active medical problems listed below. Physiatrist and rehab team continue to assess barriers to discharge/monitor patient progress toward functional and medical goals.   FIM: FIM - Bathing Bathing Steps Patient Completed: Chest;Abdomen Bathing:  (performed with his mother)  FIM - Upper Body Dressing/Undressing Upper body dressing/undressing steps patient completed: Thread/unthread right sleeve of pullover shirt/dresss;Thread/unthread left sleeve of pullover shirt/dress;Put head through opening of pull over shirt/dress Upper body dressing/undressing: 4: Min-Patient completed 75 plus % of tasks FIM - Lower Body Dressing/Undressing Lower body dressing/undressing steps patient completed: Thread/unthread right pants leg;Thread/unthread left pants leg;Pull pants up/down;Don/Doff right sock;Don/Doff left sock Lower body dressing/undressing: 1: Total-Patient completed less than 25% of tasks  FIM - Toileting Toileting: 0: Activity did not occur  FIM - Air cabin crew Transfers: 0-Activity did not occur  FIM - Control and instrumentation engineer Devices: Sliding board;Arm rests Bed/Chair Transfer: 4: Chair or W/C > Bed: Min A (steadying Pt. > 75%);4: Bed > Chair or W/C: Min A (steadying Pt. > 75%)  FIM - Locomotion: Wheelchair Distance: 50 Locomotion: Wheelchair: 1: Total Assistance/staff pushes wheelchair (Pt<25%) FIM - Locomotion: Ambulation Locomotion: Ambulation: 0: Activity did not occur  Comprehension Comprehension Mode: Auditory Comprehension: 6-Follows complex conversation/direction: With extra time/assistive  device  Expression Expression Mode: Verbal Expression: 6-Expresses complex ideas: With extra time/assistive device  Social Interaction Social Interaction: 6-Interacts appropriately with others with medication or extra time (anti-anxiety, antidepressant).  Problem Solving Problem Solving: 5-Solves basic 90% of the time/requires cueing < 10% of the time  Memory Memory: 6-More than reasonable amt of time  Medical Problem List and Plan:  1. 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 DVT Prophylaxis/Anticoagulation: Pharmaceutical: Lovenox  3. Chronic Pain Management: Continue MS contin 30 mg bid. Air bed for back and buttock pain as well as pressure relief measures.    Consider adjusting ms contin up a bit further if pain continues to be a problem  -reminded him to use heating pad for shoulder/neck symptoms, good posture when in bed and chair  -continues robaxin. TPI x5 performed yesterday with good results to left periscapular area and traps  -his clavicle fracture also in this general area with distraction laterally---denies any pain in this area.  -lyrica 43m bid for neuropathic pain 4. Mood: reviewed anxiety with patient. Discussed triggers and ways to de-escalate. Also have added prn xanax if he is unable to manage it.  -encouraged grounds pass again 5. Neuropsych: This patient is capable of making decisions on his own behalf.  6. Spinal cord infarct: steroids off-   7. DM type 2 with gastroparesis: decreased PM lantus to 20units given persistent low cbg's---and then cbg 330 this am!!!! , am lantus at 14u. Now on 3u novolog units tid ac for meal coverage.  Will adjust pm lantus up slightly  -requested endocrine consult 8. ChronicBLE wounds with chronically elevated ESR: continue doxycline for 30 days (started 02/02/14). Follow up with ID after discharge. Continue local wound care.   -right elbow granulating  in  -wet-dry with allevyn over right popliteal area 9. Constipation/diabetic gastroparesis/neurogenic bowel: reglan causing cramping--dc . Bowel program - D/C miralx, cont colace and bisacodyl supp 10. Severe diabetic peripheral neuropathy:resume neurontin 1077mbid an [id number]   11. Pulmonary:  deep breathing, IS/FV, CPAP -quad cough, assistive techniques  -keep hob at 30 degrees  -chest PT 12. Low grade temp:   -labs ok ,cxr ok  -IS, observation 13. CV---add midodrine low dose to help sustain bp when up during the day   LOS (Days) [place]  ZacMeredith Staggers20/2015, 7:26 AM

## 2014-02-24 NOTE — Progress Notes (Signed)
Physical Therapy Note  Patient Details  Name: TODRICK SIEDSCHLAG MRN: [id number] Date of Birth: [birth year] Today's Date: 02/24/2014  Time: 1055-1155 60 minutes  1:1 Pt with no c/o pain, states shoulders feel "numb" after injections.  Family education provided to pt's mother on mechanical hoyer lift for home use. Pt able to perform rolling in bed to place hoyer with leg loops and handrails with min A occasionally for LE control.  Pt's mother assisted with placing pad and hooking into hoyer.  PT demo'd to mother how to lock and move hoyer up/down, pt placed in chair with hoyer lift.  Encouraged pt's mother to watch nursing perform transfers and then she will attempt to perform transfers tomorrow.  From w/c level pt performed arm bike 2 x 2 minutes forward and backwards with 1 minute rest between sets, pt reports no increased shoulder pain with arm bike today, much improved tolerance to upright.   Time 2: 1345-1410 25 minutes  1:1 No c/o pain.  Treatment focused on UE and core strengthening with bean bag toss game. Pt performed lateral leans to reach for bean bags and pick up from container with good problem solving strategies and B UE use.  Pt with min A for occasional LOB laterally with far reaching.  Pt able to toss bean bags with B UE without anterior/posterior LOB.  [name] 02/24/2014, 11:54 AM

## 2014-02-24 NOTE — Patient Care Conference (Signed)
Inpatient RehabilitationTeam Conference and Plan of Care Update Date: 02/23/2014   Time: 2:05 PM    Patient Name: Jason [name]      Medical Record Number: [id number]  Date of Birth: 08/01/1986 Sex: Male         Room/Bed: 4W18C/4W18C-01 Payor Info: Payor: MEDICAID PENDING / Plan: MEDICAID PENDING / Product Type: *No Product type* /    Admitting Diagnosis: SPINAL CORD  Admit Date/Time:  02/05/2014  6:37 PM Admission Comments: No comment available   Primary Diagnosis:  <principal problem not specified> Principal Problem: <principal problem not specified>  Patient Active Problem List   Diagnosis Date Noted  . 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 Diabetic foot ulcer 02/04/2014  . Oral thrush 02/04/2014  . Leg weakness 01/30/2014  . Bilateral lower leg cellulitis 01/30/2014  . Altered mental status 01/30/2014  . 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 HCAP (healthcare-associated pneumonia) 01/29/2014  . DKA (diabetic ketoacidoses) 12/24/2013  . DM type 1 causing leg or foot ulcer 10/04/2013  . Rhinovirus infection 10/03/2013  . Protein-calorie malnutrition, severe 09/24/2013  . Aspiration pneumonia 09/23/2013  . Gastroparesis due to DM 06/02/2013  . Chronic low back pain 04/18/2013  . Painful diabetic neuropathy 03/20/2013  . Traumatic ecchymosis of toe 01/29/2013  . Tobacco abuse 01/29/2013  . Noncompliance with diet and medication regimen 01/29/2013  . VIRAL URI 09/15/2007  . GERD 09/15/2007  . Type 1 diabetes, uncontrolled, with neuropathy 05/29/2007  . ASTHMA 05/29/2007    Expected Discharge Date: Expected Discharge Date: 03/05/14  Team Members Present: Physician leading conference: Dr. Alger Simons Social Worker Present: Lennart Pall, LCSW Nurse Present: Rayetta Humphrey, RN PT Present: Canary Brim, PT OT Present: Chrys Racer, Artemio Aly, OT SLP Present: Weston Anna, SLP PPS Coordinator present : Daiva Nakayama, RN, CRRN;Becky Alwyn Ren, PT     Current Status/Progress  Goal Weekly Team Focus  Medical   improved UE strength, low grade temp. pain issues, TP's  education, transfer training, equiipment training  pain mgt, safety   Bowel/Bladder   Incontinent of bowel, folety catheter in place LBM 02/22/2014.  Stay free of UTI, Continue bowel program.  Continue regular bowel movements daily and stay free of infection.   Swallow/Nutrition/ Hydration             ADL's   Min/supervision UB bathing & dressing, min assist static sitting balance, min assist slide board transfers, total assist LB dressing  Supervision upper body dressing, Mod A bathing & LB dressing (pants only), Mod A sitting balance.  ADLs, bed mobility, BLE management using leg loops, sitting balance, self-directing care, self-feeding & grooming tasks, and overall activity tolerance/endurance.    Mobility   min/mod A sliding board transfers  min A overall  activity tolerance, education, balance   Communication             Safety/Cognition/ Behavioral Observations            Pain   Neck and left shoulder pain, pain to lower bottom. Scheduled Morphine 30mg  and PRN oxycodone 5-10mg .  Pain level <3.  Monitor for pain q4h.   Skin   Diabetic ulcers to bilat great toes, discolored BLE, ulcer to R elbow.  Stay free of infection and skin break down.  Assess skin q shift.    Rehab Goals Patient on target to meet rehab goals: Yes *See Care Plan and progress notes for long and short-term goals.  Barriers to Discharge: pain, BP's    Possible Resolutions to Barriers:  ?midoddrine,  pain mgt    Discharge Planning/Teaching Needs:  home with mother and girlfriend to share in providing 24/7 assistance      Team Discussion:  More injections planned today;  Some increased movement. Anticipate will d/c home with foley.  Bowel program coming together.  Still limited by pain and BP issues.  Awaiting input from endocrine.  On target for goals overall.  Revisions to Treatment Plan:  None   Continued Need  for Acute Rehabilitation Level of Care: The patient requires daily medical management by a physician with specialized training in physical medicine and rehabilitation for the following conditions: Daily direction of a multidisciplinary physical rehabilitation program to ensure safe treatment while eliciting the highest outcome that is of practical value to the patient.: Yes Daily medical management of patient stability for increased activity during participation in an intensive rehabilitation regime.: Yes Daily analysis of laboratory values and/or radiology reports with any subsequent need for medication adjustment of medical intervention for : Neurological problems;Post surgical problems;Other  Lennart Pall 02/24/2014, 12:06 PM

## 2014-02-24 NOTE — Progress Notes (Signed)
Occupational Therapy Session Note  Patient Details  Name: Jason [name] MRN: [id number] Date of Birth: [birth year]  Today's Date: 02/24/2014 Time: 3112-1624 Time Calculation (min): 45 min  Short Term Goals: Week 1:  OT Short Term Goal 1 (Week 1): Pt will tolerate sitting on EOB for 10 min with mod A. OT Short Term Goal 1 - Progress (Week 1): Progressing toward goal OT Short Term Goal 2 (Week 1): Pt will use bath mit to wash face and chest. OT Short Term Goal 2 - Progress (Week 1): Met OT Short Term Goal 3 (Week 1): Pt will use a built up spoon to self feed with min A. OT Short Term Goal 3 - Progress (Week 1): Met  Week 2:  OT Short Term Goal 1 (Week 2): Sitting Balance:  Max assist EOB for 10 min OT Short Term Goal 2 (Week 2): UB Dressing:  Patient will pull shirt down in the back with min assist for stability OT Short Term Goal 3 (Week 2): LB dressing:  Patient will donn pants over feet in long/circle sitting with min assist OT Short Term Goal 4 (Week 2): Grooming: patient will brush teeth sitting in w/c at sink with min assist for set up PRN OT Short Term Goal 5 (Week 2): Communication: Patient will demonstrate use of hospital phone with supervision  Skilled Therapeutic Interventions/Progress Updates:  Patient received seated in w/c with mother present. Focused skilled intervention on family education to patient and patient's mother regarding hoyer lift transfers. Transferred patient w/c > bed using hoyer lift, then bed > w/c. Therapist educated patient and mother on re-positioning techniques, placement of hoyer lift & patient's body placement, technique of hoyer lift transfer, safety with transfer. Therapist assisted as +2 for safe and effective transfer during this session. At end of session, left patient seated in w/c with quick release belt donned and mother present.   Precautions:  Precautions Precautions: Fall Precaution Comments: sacral breakdown-boost when in w/c, abdominal  binder and TEDs due to low BP. Required Braces or Orthoses: Other Brace/Splint Other Brace/Splint: Bilateral Prevalon boots at night Restrictions Weight Bearing Restrictions: No  See FIM for current functional status  Therapy/Group: Individual Therapy  Estelle June 02/24/2014, 3:26 PM

## 2014-02-24 NOTE — Progress Notes (Signed)
Occupational Therapy Weekly Progress Note  Patient Details  Name: Jason [name] MRN: [id number] Date of Birth: August 06, 1986  Beginning of progress report period: Feb 16, 2014 End of progress report period: Feb 24, 2014  Today's Date: 02/24/2014 Time: 0950-1050 Time Calculation (min): 60 min  Patient has met 4 of 5 short term goals.  Patient making excellent gains and his mother is active in his care (within her abilities) and very supportive.  Patient is limited by left shoulder and neck pain, sore bottom, episodes of OH (less often), significant issues with blood sugar regulation. Patient continues to demonstrate the following deficits: muscle paralysis, abnormal tone, decreased sitting balance and decreased postural control and therefore will continue to benefit from skilled OT intervention to enhance overall performance with BADL and Reduce care partner burden.  Patient progressing toward long term goals..  Continue plan of care.  OT Short Term Goals: Week 2:   OT Short Term Goal 1 (Week 2): Sitting Balance:  Max assist EOB for 10 min OT Short Term Goal 1 - Progress (Week 2): Met OT Short Term Goal 2 (Week 2): UB Dressing:  Patient will pull shirt down in the back with min assist for stability OT Short Term Goal 2 - Progress (Week 2): Met OT Short Term Goal 3 (Week 2): LB dressing:  Patient will donn pants over feet in long/circle sitting with min assist OT Short Term Goal 3 - Progress (Week 2): Met OT Short Term Goal 4 (Week 2): Grooming: patient will brush teeth sitting in w/c at sink with min assist for set up PRN OT Short Term Goal 4 - Progress (Week 2): Progressing toward goal OT Short Term Goal 5 (Week 2): Communication: Patient will demonstrate use of hospital phone with supervision OT Short Term Goal 5 - Progress (Week 2): Met Week 3: OT Short Term Goal 1 (Week 3): STG=LTGs  Skilled Therapeutic Interventions/Progress Updates:  Patient resting in bed upon arrival and his  mother at his side.  [name] reporting that she was not feeling well and plans to put on her TENS Unit and lay back down on the cot for a rest.  During session, patient's mother observed only and reports, "i'm sorry that I was not able to have his TEDS and leg loops on before you came in this morning-I did help with a bath and to help with his shirt." After review of OT goals and progress patient and mom had questions about bowel program stating that he moved his bowels last night from about 10pm til about 1am.  Reviewed education from SCI education binder located in patient's room on bowel program and stressed need for both of them to read and understand this section so they can ask questions of the MD and RN.   Provided left shoulder/scapua mobilization and stretching in sidelying before begin LB dressing.  With HOB up, patient used leg loops to don shorts over feet with min assist and min-mod cues for technique and this OT finished pulling pants up due to decreased time.  Once TED hose and abdominal binder in place, patient able to use leg loops to place feet off EOB and slide board was placed under right thigh by this OT while patient not yet upright and resting on raised HOB.  Patient able to guide this OT through steps needed to assist him with transfer to w/c. [name] and father in room by end of session.  Therapy Documentation Precautions:  Precautions Precautions: Fall Precaution  Comments: sacral breakdown-boost when in w/c, abdominal binder and TEDs due to low BP. Required Braces or Orthoses: Other Brace/Splint Other Brace/Splint: Bilateral Prevalon boots at night Restrictions Weight Bearing Restrictions: No Pain: Denies pain yet reports feeling numb and tight in upper back and shoulder area "where Dr. Naaman Plummer gave me 5-6 shots".  Stretching provided. ADL: ADL ADL Comments: refer to FIM  See FIM for current functional status  Therapy/Group: Individual  Therapy  Gaye Pollack 02/24/2014, 12:41 PM

## 2014-02-25 ENCOUNTER — Encounter: Payer: Self-pay | Admitting: Internal Medicine

## 2014-02-25 ENCOUNTER — Encounter (HOSPITAL_COMMUNITY): Payer: Self-pay | Admitting: Occupational Therapy

## 2014-02-25 ENCOUNTER — Inpatient Hospital Stay (HOSPITAL_COMMUNITY): Payer: Self-pay | Admitting: Occupational Therapy

## 2014-02-25 ENCOUNTER — Inpatient Hospital Stay (HOSPITAL_COMMUNITY): Payer: Medicaid Other | Admitting: *Deleted

## 2014-02-25 LAB — GLUCOSE, CAPILLARY
GLUCOSE-CAPILLARY: 123 mg/dL — AB (ref 70–99)
GLUCOSE-CAPILLARY: 195 mg/dL — AB (ref 70–99)
Glucose-Capillary: 342 mg/dL — ABNORMAL HIGH (ref 70–99)
Glucose-Capillary: 260 mg/dL — ABNORMAL HIGH (ref 70–99)
Glucose-Capillary: 210 mg/dL — ABNORMAL HIGH (ref 70–99)

## 2014-02-25 MED ORDER — METOCLOPRAMIDE HCL 5 MG PO TABS
5.0000 mg | ORAL_TABLET | Freq: Every day | ORAL | Status: DC
  Administered 2014-02-25 – 2014-03-05 (×9): 5 mg via ORAL
  Filled 2014-02-25 (×10): qty 1

## 2014-02-25 MED ORDER — MORPHINE SULFATE ER 30 MG PO TBCR
45.0000 mg | EXTENDED_RELEASE_TABLET | Freq: Two times a day (BID) | ORAL | Status: DC
  Administered 2014-02-25 – 2014-03-05 (×16): 45 mg via ORAL
  Filled 2014-02-25 (×32): qty 1

## 2014-02-25 NOTE — Progress Notes (Addendum)
Occupational Therapy Session Note  Patient Details  Name: [place] W Stangeland MRN: [id number] Date of Birth: 05/06/1986  Today's Date: 02/25/2014 Time: 0920-1000 Time Calculation (min): 40 min  Short Term Goals: Week 1:  OT Short Term Goal 1 (Week 1): Pt will tolerate sitting on EOB for 10 min with mod A. OT Short Term Goal 1 - Progress (Week 1): Progressing toward goal OT Short Term Goal 2 (Week 1): Pt will use bath mit to wash face and chest. OT Short Term Goal 2 - Progress (Week 1): Met OT Short Term Goal 3 (Week 1): Pt will use a built up spoon to self feed with min A. OT Short Term Goal 3 - Progress (Week 1): Met  Week 2:  OT Short Term Goal 1 (Week 2): Sitting Balance:  Max assist EOB for 10 min OT Short Term Goal 1 - Progress (Week 2): Met OT Short Term Goal 2 (Week 2): UB Dressing:  Patient will pull shirt down in the back with min assist for stability OT Short Term Goal 2 - Progress (Week 2): Met OT Short Term Goal 3 (Week 2): LB dressing:  Patient will donn pants over feet in long/circle sitting with min assist OT Short Term Goal 3 - Progress (Week 2): Met OT Short Term Goal 4 (Week 2): Grooming: patient will brush teeth sitting in w/c at sink with min assist for set up PRN OT Short Term Goal 4 - Progress (Week 2): Progressing toward goal OT Short Term Goal 5 (Week 2): Communication: Patient will demonstrate use of hospital phone with supervision OT Short Term Goal 5 - Progress (Week 2): Met  Week 3:  OT Short Term Goal 1 (Week 3): STG=LTGs  Skilled Therapeutic Interventions/Progress Updates:  Addendum: No direct complaints of pain this am.  Patient found seated in w/c with hoyer lift hooked up, patient's mother was assisting with hoyer lift transfer bed > w/c. [name] asked OT for additional assistance with re-positioning patient in w/c. Once safely in w/c, patient completed grooming task of brushing teeth seated at sink in w/c, patient completed this task  independently. Therapist propelled patient > therapy gym and patient with comment, "I'm not feeling this right now". Patient willing to work with therapist and patient engaged in slide board transfer > therapy mat with minimal assistance. Therapist educated patient on dynamic sitting balance and importance of maintaining balance in different planes (forward, lateral, back). Patient not receptive to learning this information at this time, "I don't see why I need to be able to do that". Therapist explained importance of maintaining sitting balance for self-care and functional transfers/mobillity. Patient transferred back to w/c with min assist using slide board. Therapist encouraged patient to direct care as needed during transfers. Patient worked on self propulsion back to room. Left patient seated in w/c with quick release belt donned and all needs within reach.   Precautions:  Precautions Precautions: Fall Precaution Comments: sacral breakdown-boost when in w/c, abdominal binder and TEDs due to low BP. Required Braces or Orthoses: Other Brace/Splint Other Brace/Splint: Bilateral Prevalon boots at night Restrictions Weight Bearing Restrictions: No  See FIM for current functional status  Therapy/Group: Individual Therapy   M  02/25/2014, 10:15 AM  

## 2014-02-25 NOTE — Progress Notes (Signed)
Physical Therapy Note  Patient Details  Name: Jason [name] MRN: [id number] Date of Birth: 1986-08-17 Today's Date: 02/25/2014  Time: 11:30 - 12:00 30 minutes  1:1, pt c/o "tiredness" pain, eases with repositioning and RN aware.  Pt performed w/c <> mat using SB with min A for trunk and hip control, pt directed transfer and w/c parts management independently.  Sitting balance and tolerance at edge of mat with UE support on table in front of him, did not require posterior support.  Problem solving for object manipulation and weight shifting through UE to eat.  Discussed strategies for how pt can direct caregivers with care and transfers.  Jason [name] 02/25/2014, 12:10 PM

## 2014-02-25 NOTE — Progress Notes (Addendum)
Recreational Therapy Session Note  Patient Details  Name: Jason [name] MRN: [id number] Date of Birth: Feb 29, 1986 Today's Date: 02/25/2014  Pain: c/o L shoulder pain, heat applied up on entry to the room Skilled Therapeutic Interventions/Progress Updates: Session focused on activity tolerance, dynamic sitting balance, BUE use, slide board transfers, & directing care.  Pt directed therapist accurately & appropriately in set up & physical assist required for slide board transfers with min cues/min assist for transfers.  Pt sat EOM with forearms resting on tabletop for simple self feeding task with close supervision ~15 minutes.  Discussion/education on use of leisure time post discharge and identifying potential activities for participation.  Pt returned to room with family and set up as requested.  Therapy/Group: Co-Treatment  Waldon Reining 02/25/2014, 12:14 PM

## 2014-02-25 NOTE — Progress Notes (Signed)
Reviewed and in agreement with treatment provided.  

## 2014-02-25 NOTE — Progress Notes (Signed)
Physical Therapy Session Note  Patient Details  Name: Jason [name] MRN: [id number] Date of Birth: 1986-02-25  Today's Date: 02/25/2014 Time: 1638-4665 Time Calculation (min): 39 min  Short Term Goals: Week 3:  PT Short Term Goal 1 (Week 3): pt will self direct set up for sliding board transfers independently PT Short Term Goal 2 (Week 3): pt will tolerate sitting edge of bed for functional activity x 5 minutes with min A PT Short Term Goal 3 (Week 3): pt will perform bed mobility with min A  Skilled Therapeutic Interventions/Progress Updates:    Patient received supine in bed, stating he had transferred back to bed because he thought he might have a bowel movement. Patient requesting for therapist to return a little later in order to see if he can have BM. Returned 20 min later and patient had not had bowel movement, but ready to get up for therapy. Patient supine>sit with use of bedrails and leg loops and minA for trunk elevation, slideboard transfer bed>wheelchair with minA. Patient able to direct all aspects of transfer independently. Wheelchair mobility x25' with supervision, slow pace and patient with c/o shoulder pain. Seated in wheelchair shooting basketball overhand and underhand. Patient demonstrates good compensatory strategies and more accuracy with overhand shot. Patient returned to room and left seated in wheelchair with seatbelt donned and all needs within reach.  Therapy Documentation Precautions:  Precautions Precautions: Fall Precaution Comments: sacral breakdown-boost when in w/c, abdominal binder and TEDs due to low BP. Required Braces or Orthoses: Other Brace/Splint Other Brace/Splint: Bilateral Prevalon boots at night Restrictions Weight Bearing Restrictions: No General: Amount of Missed PT Time (min): 21 Minutes Missed Time Reason: Unavailable (comment) (attempting BM) Pain: Pain Assessment Pain Assessment: 0-10 Pain Score: 3  Pain Type: Acute pain Pain  Location: Shoulder Pain Orientation: Right;Left Pain Descriptors / Indicators: Aching;Sore Pain Frequency: Constant Pain Onset: With Activity Patients Stated Pain Goal: 3 Pain Intervention(s): RN made aware;Repositioned Multiple Pain Sites: No Locomotion : Wheelchair Mobility Distance: 25   See FIM for current functional status  Therapy/Group: Individual Therapy  Lillia Abed. Terreon Ekholm, PT, DPT 02/25/2014, 4:43 PM

## 2014-02-25 NOTE — Progress Notes (Signed)
Subjective/Complaints: Still having upper back pain.  A 12 point review of systems has been performed and if not noted above is otherwise negative.   Objective: Vital Signs: Blood pressure 97/65, pulse 95, temperature 97.1 F (36.2 C), temperature source Axillary, resp. rate 18, weight 57.924 kg (127 lb 11.2 oz), SpO2 98.00%. No results found. Results for orders placed during the hospital encounter of 02/05/14 (from the past 72 hour(s))  GLUCOSE, CAPILLARY     Status: Abnormal   Collection Time    02/22/14 11:35 AM      Result Value Ref Range   Glucose-Capillary 133 (*) 70 - 99 mg/dL  GLUCOSE, CAPILLARY     Status: Abnormal   Collection Time    02/22/14  4:18 PM      Result Value Ref Range   Glucose-Capillary 248 (*) 70 - 99 mg/dL  GLUCOSE, CAPILLARY     Status: Abnormal   Collection Time    02/22/14  9:01 PM      Result Value Ref Range   Glucose-Capillary 237 (*) 70 - 99 mg/dL  GLUCOSE, CAPILLARY     Status: Abnormal   Collection Time    02/23/14  7:44 AM      Result Value Ref Range   Glucose-Capillary 334 (*) 70 - 99 mg/dL  BASIC METABOLIC PANEL     Status: Abnormal   Collection Time    02/23/14  7:50 AM      Result Value Ref Range   Sodium 134 (*) 137 - 147 mEq/L   Potassium 4.7  3.7 - 5.3 mEq/L   Chloride 100  96 - 112 mEq/L   CO2 24  19 - 32 mEq/L   Glucose, Bld 351 (*) 70 - 99 mg/dL   BUN 38 (*) 6 - 23 mg/dL   Creatinine, Ser 0.97  0.50 - 1.35 mg/dL   Calcium 9.0  8.4 - 10.5 mg/dL   GFR calc non Af Amer >90  >90 mL/min   GFR calc Af Amer >90  >90 mL/min   Comment: (NOTE)     The eGFR has been calculated using the CKD EPI equation.     This calculation has not been validated in all clinical situations.     eGFR's persistently <90 mL/min signify possible Chronic Kidney     Disease.  GLUCOSE, CAPILLARY     Status: Abnormal   Collection Time    02/23/14 11:20 AM      Result Value Ref Range   Glucose-Capillary 112 (*) 70 - 99 mg/dL  GLUCOSE, CAPILLARY      Status: Abnormal   Collection Time    02/23/14  4:06 PM      Result Value Ref Range   Glucose-Capillary 357 (*) 70 - 99 mg/dL  GLUCOSE, CAPILLARY     Status: Abnormal   Collection Time    02/23/14  9:29 PM      Result Value Ref Range   Glucose-Capillary 417 (*) 70 - 99 mg/dL  GLUCOSE, CAPILLARY     Status: Abnormal   Collection Time    02/24/14 12:04 AM      Result Value Ref Range   Glucose-Capillary 307 (*) 70 - 99 mg/dL  GLUCOSE, CAPILLARY     Status: Abnormal   Collection Time    02/24/14  7:23 AM      Result Value Ref Range   Glucose-Capillary 62 (*) 70 - 99 mg/dL  GLUCOSE, CAPILLARY     Status: Abnormal   Collection Time  02/24/14  7:38 AM      Result Value Ref Range   Glucose-Capillary 58 (*) 70 - 99 mg/dL  GLUCOSE, CAPILLARY     Status: None   Collection Time    02/24/14  8:30 AM      Result Value Ref Range   Glucose-Capillary 85  70 - 99 mg/dL  GLUCOSE, CAPILLARY     Status: Abnormal   Collection Time    02/24/14 11:32 AM      Result Value Ref Range   Glucose-Capillary 154 (*) 70 - 99 mg/dL  GLUCOSE, CAPILLARY     Status: Abnormal   Collection Time    02/24/14  4:33 PM      Result Value Ref Range   Glucose-Capillary 117 (*) 70 - 99 mg/dL  GLUCOSE, CAPILLARY     Status: Abnormal   Collection Time    02/24/14  9:09 PM      Result Value Ref Range   Glucose-Capillary 130 (*) 70 - 99 mg/dL  GLUCOSE, CAPILLARY     Status: Abnormal   Collection Time    02/25/14  3:18 AM      Result Value Ref Range   Glucose-Capillary 342 (*) 70 - 99 mg/dL  GLUCOSE, CAPILLARY     Status: Abnormal   Collection Time    02/25/14  7:37 AM      Result Value Ref Range   Glucose-Capillary 260 (*) 70 - 99 mg/dL     HEENT: normal Cardio: RRR and no murmur Resp: CTA B/L and unlabored GI: BS positive and mildly distended and tympanitic, mild tenderness Extremity:  No Edema Skin:  . Neuro: Alert/Oriented, Abnormal Sensory absent in feet and Abnormal Motor 0/5 in LE,0/5 finger  extension and 0/5 hand intrinsic. Wrist extension 2-, 3/5 tricep   2, biceps 5/5. Decreased sensation below elbows bilaterally but able to ID LT in Left C6-7 dermatomes. ?trace HF on right. Musc/Skel:  Generalized upper thoracic spine tenderness.  Gen NAD   Assessment/Plan: 1. 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 Physiatrist is providing close team supervision and 24 hour management of active medical problems listed below. Physiatrist and rehab team continue to assess barriers to discharge/monitor patient progress toward functional and medical goals.   FIM: FIM - Bathing Bathing Steps Patient Completed: Chest;Abdomen Bathing:  (performed with his mother)  FIM - Upper Body Dressing/Undressing Upper body dressing/undressing steps patient completed: Thread/unthread right sleeve of pullover shirt/dresss;Thread/unthread left sleeve of pullover shirt/dress;Put head through opening of pull over shirt/dress Upper body dressing/undressing: 4: Min-Patient completed 75 plus % of tasks FIM - Lower Body Dressing/Undressing Lower body dressing/undressing steps patient completed: Thread/unthread right pants leg;Thread/unthread left pants leg;Pull pants up/down;Don/Doff right sock;Don/Doff left sock Lower body dressing/undressing: 1: Total-Patient completed less than 25% of tasks  FIM - Toileting Toileting: 0: Activity did not occur  FIM - Air cabin crew Transfers: 0-Activity did not occur  FIM - Control and instrumentation engineer Devices: Sliding board;Arm rests Bed/Chair Transfer: 4: Chair or W/C > Bed: Min A (steadying Pt. > 75%)  FIM - Locomotion: Wheelchair Distance: 50 Locomotion: Wheelchair: 1: Total Assistance/staff pushes wheelchair (Pt<25%) FIM - Locomotion: Ambulation Locomotion: Ambulation: 0: Activity did not occur  Comprehension Comprehension  Mode: Auditory Comprehension: 6-Follows complex conversation/direction: With extra time/assistive device  Expression Expression Mode: Verbal Expression: 6-Expresses complex ideas: With extra time/assistive device  Social Interaction Social Interaction: 6-Interacts appropriately with others with  medication or extra time (anti-anxiety, antidepressant).  Problem Solving Problem Solving: 5-Solves complex 90% of the time/cues < 10% of the time  Memory Memory: 6-More than reasonable amt of time  Medical Problem List and Plan:  1. 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 DVT Prophylaxis/Anticoagulation: Pharmaceutical: Lovenox  3. Chronic Pain Management: increase MS contin to 45 mg bid. Air bed for back and buttock pain as well as pressure relief measures.      -reminded him to use heating pad for shoulder/neck symptoms, good posture when in bed and chair  -continues robaxin. TPI x5 performed yesterday with good results to left periscapular area and traps  -his clavicle fracture also in this general area with distraction laterally---denies any pain in this area.  -lyrica 5m bid for neuropathic pain 4. Mood: reviewed anxiety with patient. Discussed triggers and ways to de-escalate. Also have added prn xanax if he is unable to manage it.  -encouraged grounds pass again 5. Neuropsych: This patient is capable of making decisions on his own behalf.  6. Spinal cord infarct: steroids off-   7. DM type 2 with gastroparesis: decreased PM lantus to 18 units given persistent low cbg's-- . Now on 5u novolog units tid ac for meal coverage.    -appreciate Dr. ECordelia Penone-time consultation 8. ChronicBLE wounds with chronically elevated ESR: continue doxycline for 30 days (started 02/02/14). Follow up with ID after discharge. Continue local wound care.   -right elbow granulating in  -wet-dry with allevyn over right popliteal  area 9. Constipation/diabetic gastroparesis/neurogenic bowel: reglan causing cramping--dc . Bowel program - D/C miralx, cont colace and bisacodyl supp 10. Severe diabetic peripheral neuropathy:resume neurontin 10102mbid an 40023mqhs   11. Pulmonary:  deep breathing, IS/FV, CPAP -quad cough, assistive techniques  -keep hob at 30 degrees  -chest PT 12. Low grade temp:   -labs ok ,cxr ok  -IS, observation 13. CV---added midodrine low dose to help sustain bp when up during the day   LOS (Days) 20 [place]AL[place] WAS PERFORMED  ZacMeredith Staggers21/2015, 9:45 AM

## 2014-02-25 NOTE — Progress Notes (Signed)
Occupational Therapy Session Note  Patient Details  Name: Jason [name] MRN: [id number] Date of Birth: 05/21/1986  Today's Date: 02/25/2014 Time: 1191-4782 Time Calculation (min): 47 min  Short Term Goals: Week 3:  OT Short Term Goal 1 (Week 3): STG=LTGs  Skilled Therapeutic Interventions/Progress Updates:    Patient seen this pm for OT intervention to address level surface transfers and sitting balance.  Patient able to direct each step accurately for slide board transfer.  Patient required mod assist for transfer to mat table from wheelchair and min assist for transfer back to chair.  Patient worked to better weight shift forward and laterally while seated.  Patient able to maintain upright position while weight shifted forward using leg loops to help pull forward and to balance self.  Worked with patient to increase weight shifting needed to reposition arms for each transition.  Patient often attempting to move arm while bearing weight through it, resulting in no movement, or pitching forward /backward of trunk.  Patient with one significant loss of balance backward unable to recover without max assist.    Therapy Documentation Precautions:  Precautions Precautions: Fall Precaution Comments: sacral breakdown-boost when in w/c, abdominal binder and TEDs due to low BP. Required Braces or Orthoses: Other Brace/Splint Other Brace/Splint: Bilateral Prevalon boots at night Restrictions Weight Bearing Restrictions: No   Pain: Shoulders:  Not scored.  "I want to work through my pain."  ADL: ADL ADL Comments: refer to FIM  See FIM for current functional status  Therapy/Group: Individual Therapy  Mariah Milling 02/25/2014, 3:42 PM

## 2014-02-26 ENCOUNTER — Inpatient Hospital Stay (HOSPITAL_COMMUNITY): Payer: Medicaid Other | Admitting: *Deleted

## 2014-02-26 ENCOUNTER — Inpatient Hospital Stay (HOSPITAL_COMMUNITY): Payer: Medicaid Other | Admitting: Physical Therapy

## 2014-02-26 ENCOUNTER — Encounter (HOSPITAL_COMMUNITY): Payer: Self-pay | Admitting: Occupational Therapy

## 2014-02-26 ENCOUNTER — Inpatient Hospital Stay (HOSPITAL_COMMUNITY): Payer: Self-pay | Admitting: Occupational Therapy

## 2014-02-26 LAB — GLUCOSE, CAPILLARY
Glucose-Capillary: 302 mg/dL — ABNORMAL HIGH (ref 70–99)
Glucose-Capillary: 320 mg/dL — ABNORMAL HIGH (ref 70–99)
Glucose-Capillary: 220 mg/dL — ABNORMAL HIGH (ref 70–99)
Glucose-Capillary: 215 mg/dL — ABNORMAL HIGH (ref 70–99)
Glucose-Capillary: 233 mg/dL — ABNORMAL HIGH (ref 70–99)

## 2014-02-26 MED ORDER — INSULIN GLARGINE 100 UNIT/ML ~~LOC~~ SOLN
20.0000 [IU] | Freq: Every day | SUBCUTANEOUS | Status: DC
  Administered 2014-02-26 – 2014-02-28 (×3): 20 [IU] via SUBCUTANEOUS
  Filled 2014-02-26 (×4): qty 0.2

## 2014-02-26 NOTE — Progress Notes (Signed)
Have reviewed cbg's since the 13th of May regarding pattern of glucose in relation to insulin doses.    First pattern: (May 13th into May 14th)When the fastings were low, it was typically due to 2 doses of lantus, 8 units and 20 units causing hypoglycemia in the am. Pt then got no meal coverage for breakfast, then the ac lunch cbg was elevating and given correction and 3 units meal coverage and then increased to 5 units; however the meal coverage is not enough at 5 units. By late afternoon before dinner, the glucose becomes extremely high into the 300's, some into 400's.  Second patter noted one day with total lantus dose of only 8 units given, thus the fasting the following day was high and stayed high throughout the day -then causing need for high correction doses.  Then noted Lantus dose backed down to 18 units which resulted in elevated fastings again. In addition the meal coverage of 3 units and then 5 units did not appear to be adequately covering the meals. Timing of the correction and meal coverage given combined was sometimes as much as 2 hrs after the cbg was tested.  TIMING IS EVERYTHING IN ADDITION TO CONSISTENT DOSING.  Correction must be given within 1 hr of taking the cbg and meal coverage must be taken immediately before, during or immediately after the meal is consumed-if the pt eats at least 50% of the meal. (can give correction and meal coverage seperately if needed)  According to pt's weight, pt should be taking approximately 25-30 units lantus in order to control fasting glucose and cover basal (only) needs Meal coverage is then calculated at approximately 8-10 units tidwc in order to cover the meals (only) Then a sensitive correction and HS correction can be used to help better establish correct basal and bolus doses.  Noted Dr Loanne Drilling is not to be consulted while pt in house. The above calculations, if given in a timely manner should meet the insulin needs. )    Thank you,  Rosita Kea, RN, CNS, Diabetes Coordinator [phone]

## 2014-02-26 NOTE — Progress Notes (Signed)
Occupational Therapy Session Notes  Patient Details  Name: Jason [name] MRN: [id number] Date of Birth: 1986-06-11  Today's Date: 02/26/2014 Time: 1100-1230 and 3419-3790 Time Calculation (min): 90 min and 30 min  Short Term Goals: Week 3:  OT Short Term Goal 1 (Week 3): STG=LTGs  Skilled Therapeutic Interventions/Progress Updates:  1)  Patient resting in bed upon arrival reporting that he had a drop in BP while up in w/c therefore, he was assisted to bed.  Patient reports feeling ready to get back up in w/c.  With HOB at ~45 degrees, patient able to place LEs off the bed to include use of leg loops.  Patient able to get into a modified side sitting/lying then push himself almost into sitting with LUE on bed and pushing with elbow under him and push with RUE on bedrail.  Patient hooked with his RUE onto this OTs arm to come to a full sit EOB.  Slide board was positioned under him when his right hip not yet on the bed therefore, the patient was positioned on the board once he was upright.  While in sidesitting, patient attempted to try a different technique to back up into the w/c.  Approximately halfway through the slide board transfer, patient reports not feeling steady enough with this new technique therefore continued the transfer using the method known to patient.  Once in w/c, patient reports symptoms of low BP therefore w/c was tilted and BLEs raised up.  Patient feeling better within 1-2 minutes and w/c tilted upright.  Patient's fiance assisted to steady slideboard and tilted w/c when needed.  Fiance pushed w/c into family room for viewing of a DVD of an individual with a SCI performing, dressing, undressing in bed and in w/c as well as bed mobility, transfers><mat, tub bench and furniture.  Patient and family asking appropriate questions and DVD paused periodically for more explanation and discussion.  2)  Patient seated in w/c upon arrival.  Patient was alone in his room and had managed  to propel his w/c to the door with plans to open the door yet he was unable to manage then the OT arrived.  Engaged patient in discussion/explanation of use of padded tub bench with cut out for toileting needs.  Up to this point in patient's recovery, orthostatic hypotension has made sitting on a BSC for bowel program has been unsafe.  Reviewed with patient and patient's mother the pros and cons of side lying vs sitting upright for bowel program.  Also demonstrated functional mobility and positioning while on padded tub bench with cut out.  Patient stated that he wil be so glad when he can sit on [facility] to perform toileting.  Therapy Documentation Precautions:  Precautions Precautions: Fall Precaution Comments: sacral breakdown-boost when in w/c, abdominal binder and TEDs due to low BP. Required Braces or Orthoses: Other Brace/Splint Other Brace/Splint: Bilateral Prevalon boots at night Restrictions Weight Bearing Restrictions: No Pain: Denies pain in both sessions  ADL: See FIM for current functional status  Therapy/Group: Individual Therapy both sessions  Gaye Pollack 02/26/2014, 1:31 PM

## 2014-02-26 NOTE — Progress Notes (Signed)
Physical Therapy Note  Patient Details  Name: KRYSTOPHER KUENZEL MRN: [id number] Date of Birth: January 07, 1986 Today's Date: 02/26/2014  Time: [id number] 60 minutes  1:1 No c/o pain.  Treatment session focused on w/c evaluation with Corene Cornea from [place].  Pt evaluated and educated on power w/c needs and options. Pt/family expressed understanding.  Pt performed bed mobility with min A for supine to sit, supervision for rolling with use of leg loops.  Sliding board with min A to w/c.  Pt with feeling of "funny" after transfer, BP 68/35, pt tilted and LEs elevated, BP returned to 132/87, RN made aware.   Danae Orleans Devarion Mcclanahan 02/26/2014, 10:00 AM

## 2014-02-26 NOTE — Progress Notes (Signed)
Physical Therapy Session Note  Patient Details  Name: Jason [name] MRN: [id number] Date of Birth: 05-Jul-1986  Today's Date: 02/26/2014 Time: 1500-1530 Time Calculation (min): 30 min  Skilled Therapeutic Interventions/Progress Updates:  Tx focused on self-directed care, functional mobility and WC positioning, and dynamic sitting balance.  Pt not wanting to perform transfers this session, but agreeable to mobility in chair. Pt reviewed pressure relief techniques, focusing on self-directing care. Pt instructed PT and NT on positioning for scooting forward and back in chair for comfort and increased stability for UE movements.  Performed ring toss and bil UE holding large yellow ball and reaching it to targets x20 each with close S for safety.  BP dropped in sitting, assist with tightening binder and tilting, returned to normal limits (see flowsheet).  Pt left up in room with family and all needs in reach.      Therapy Documentation Precautions:  Precautions Precautions: Fall Precaution Comments: sacral breakdown-boost when in w/c, abdominal binder and TEDs due to low BP. Required Braces or Orthoses: Other Brace/Splint Other Brace/Splint: Bilateral Prevalon boots at night Restrictions Weight Bearing Restrictions: No    Vital Signs: Therapy Vitals Temp: 98.5 F (36.9 C) Temp src: Oral Pulse Rate: 92 Resp: 16 BP: 114/77 mmHg Patient Position (if appropriate): Lying (Reclined) Oxygen Therapy SpO2: 100 % O2 Device: None (Room air) Pain: Pain Assessment Pain Assessment: 0-10 Pain Score: 8  Pain Location: Back Pain Intervention(s): RN made aware  See FIM for current functional status  Therapy/Group: Individual Therapy Kennieth Rad, PT, DPT   02/26/2014, 3:43 PM

## 2014-02-26 NOTE — Progress Notes (Signed)
Subjective/Complaints: Still having some pain in his upper back. bp's better with midodrine---low at night time however  A 12 point review of systems has been performed and if not noted above is otherwise negative.   Objective: Vital Signs: Blood pressure 119/80, pulse 91, temperature 98.6 F (37 C), temperature source Oral, resp. rate 18, weight 57.924 kg (127 lb 11.2 oz), SpO2 98.00%. No results found. Results for orders placed during the hospital encounter of 02/05/14 (from the past 72 hour(s))  GLUCOSE, CAPILLARY     Status: Abnormal   Collection Time    02/23/14 11:20 AM      Result Value Ref Range   Glucose-Capillary 112 (*) 70 - 99 mg/dL  GLUCOSE, CAPILLARY     Status: Abnormal   Collection Time    02/23/14  4:06 PM      Result Value Ref Range   Glucose-Capillary 357 (*) 70 - 99 mg/dL  GLUCOSE, CAPILLARY     Status: Abnormal   Collection Time    02/23/14  9:29 PM      Result Value Ref Range   Glucose-Capillary 417 (*) 70 - 99 mg/dL  GLUCOSE, CAPILLARY     Status: Abnormal   Collection Time    02/24/14 12:04 AM      Result Value Ref Range   Glucose-Capillary 307 (*) 70 - 99 mg/dL  GLUCOSE, CAPILLARY     Status: Abnormal   Collection Time    02/24/14  7:23 AM      Result Value Ref Range   Glucose-Capillary 62 (*) 70 - 99 mg/dL  GLUCOSE, CAPILLARY     Status: Abnormal   Collection Time    02/24/14  7:38 AM      Result Value Ref Range   Glucose-Capillary 58 (*) 70 - 99 mg/dL  GLUCOSE, CAPILLARY     Status: None   Collection Time    02/24/14  8:30 AM      Result Value Ref Range   Glucose-Capillary 85  70 - 99 mg/dL  GLUCOSE, CAPILLARY     Status: Abnormal   Collection Time    02/24/14 11:32 AM      Result Value Ref Range   Glucose-Capillary 154 (*) 70 - 99 mg/dL  GLUCOSE, CAPILLARY     Status: Abnormal   Collection Time    02/24/14  4:33 PM      Result Value Ref Range   Glucose-Capillary 117 (*) 70 - 99 mg/dL  GLUCOSE, CAPILLARY     Status: Abnormal   Collection Time    02/24/14  9:09 PM      Result Value Ref Range   Glucose-Capillary 130 (*) 70 - 99 mg/dL  GLUCOSE, CAPILLARY     Status: Abnormal   Collection Time    02/25/14  3:18 AM      Result Value Ref Range   Glucose-Capillary 342 (*) 70 - 99 mg/dL  GLUCOSE, CAPILLARY     Status: Abnormal   Collection Time    02/25/14  7:37 AM      Result Value Ref Range   Glucose-Capillary 260 (*) 70 - 99 mg/dL  GLUCOSE, CAPILLARY     Status: Abnormal   Collection Time    02/25/14 12:10 PM      Result Value Ref Range   Glucose-Capillary 123 (*) 70 - 99 mg/dL  GLUCOSE, CAPILLARY     Status: Abnormal   Collection Time    02/25/14  5:04 PM      Result  Value Ref Range   Glucose-Capillary 210 (*) 70 - 99 mg/dL  GLUCOSE, CAPILLARY     Status: Abnormal   Collection Time    02/25/14  9:14 PM      Result Value Ref Range   Glucose-Capillary 195 (*) 70 - 99 mg/dL  GLUCOSE, CAPILLARY     Status: Abnormal   Collection Time    02/26/14  3:03 AM      Result Value Ref Range   Glucose-Capillary 302 (*) 70 - 99 mg/dL   Comment 1 Notify RN    GLUCOSE, CAPILLARY     Status: Abnormal   Collection Time    02/26/14  7:37 AM      Result Value Ref Range   Glucose-Capillary 320 (*) 70 - 99 mg/dL     HEENT: normal Cardio: RRR and no murmur Resp: CTA B/L and unlabored GI: BS positive and mildly distended and tympanitic, mild tenderness Extremity:  No Edema Skin:  . Neuro: Alert/Oriented, Abnormal Sensory absent in feet and Abnormal Motor 0/5 in LE,0/5 finger extension and 0/5 hand intrinsic. Wrist extension 2-, 3/5 tricep   2, biceps 5/5. Decreased sensation below elbows bilaterally but able to ID LT in Left C6-7 dermatomes. ?trace HF on right. Musc/Skel:  Generalized upper thoracic spine tenderness.  Gen NAD   Assessment/Plan: 1. 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 Physiatrist is  providing close team supervision and 24 hour management of active medical problems listed below. Physiatrist and rehab team continue to assess barriers to discharge/monitor patient progress toward functional and medical goals.   FIM: FIM - Bathing Bathing Steps Patient Completed: Chest;Abdomen Bathing:  (performed with his mother)  FIM - Upper Body Dressing/Undressing Upper body dressing/undressing steps patient completed: Thread/unthread right sleeve of pullover shirt/dresss;Thread/unthread left sleeve of pullover shirt/dress;Put head through opening of pull over shirt/dress Upper body dressing/undressing: 4: Min-Patient completed 75 plus % of tasks FIM - Lower Body Dressing/Undressing Lower body dressing/undressing steps patient completed: Thread/unthread right pants leg;Thread/unthread left pants leg;Pull pants up/down;Don/Doff right sock;Don/Doff left sock Lower body dressing/undressing: 1: Total-Patient completed less than 25% of tasks  FIM - Toileting Toileting: 0: Activity did not occur  FIM - Archivist Transfers: 0-Activity did not occur  FIM - Banker Devices: Sliding board;Arm rests;Bed rails Bed/Chair Transfer: 4: Bed > Chair or W/C: Min A (steadying Pt. > 75%);4: Supine > Sit: Min A (steadying Pt. > 75%/lift 1 leg)  FIM - Locomotion: Wheelchair Distance: 25 Locomotion: Wheelchair: 1: Travels less than 50 ft with supervision, cueing or coaxing FIM - Locomotion: Ambulation Locomotion: Ambulation: 0: Activity did not occur  Comprehension Comprehension Mode: Auditory Comprehension: 6-Follows complex conversation/direction: With extra time/assistive device  Expression Expression Mode: Verbal Expression: 6-Expresses complex ideas: With extra time/assistive device  Social Interaction Social Interaction: 6-Interacts appropriately with others with medication or extra time (anti-anxiety, antidepressant).  Problem  Solving Problem Solving: 5-Solves complex 90% of the time/cues < 10% of the time  Memory Memory: 6-More than reasonable amt of time  Medical Problem List and Plan:  1. 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 DVT Prophylaxis/Anticoagulation: Pharmaceutical: Lovenox  3. Chronic Pain Management: increase MS contin to 45 mg bid. Air bed for back and buttock pain as well as pressure relief measures.      -reminded him to use heating pad for shoulder/neck symptoms,  good posture when in bed and chair  -continues robaxin.    -his clavicle fracture also in this general area with distraction laterally---denies any pain in this area.  -lyrica 70m bid for neuropathic pain 4. Mood: reviewed anxiety with patient. Discussed triggers and ways to de-escalate. Also have added prn xanax if he is unable to manage it.    5. Neuropsych: This patient is capable of making decisions on his own behalf.  6. Spinal cord infarct: steroids off-   7. DM type 2 with gastroparesis: increase PM lantus to 20 units given persistent low cbg's-- . Now on 5u novolog units tid ac for meal coverage.    -appreciate Dr. ECordelia Penone-time consultation 8. ChronicBLE wounds with chronically elevated ESR: continue doxycline for 30 days (started 02/02/14). Follow up with ID after discharge. Continue local wound care.   -right elbow granulating in  -wet-dry with allevyn over right popliteal area 9. Constipation/diabetic gastroparesis/neurogenic bowel: reglan causing cramping--dc . Bowel program - D/C miralx, cont colace and bisacodyl supp 10. Severe diabetic peripheral neuropathy:resume neurontin 1017mbid an [id number]   11. Pulmonary:  deep breathing, IS/FV, CPAP -quad cough, assistive techniques  -keep hob at 30 degrees  -chest PT 12. Low grade temp:   -labs ok ,cxr ok  -IS, observation 13. CV---added midodrine low dose to help sustain bp when up during  the day which seems to help   LOS (Days) 21 DodsonALUATION WAS PERFORMED  ZacMeredith Staggers22/2015, 8:55 AM

## 2014-02-27 ENCOUNTER — Inpatient Hospital Stay (HOSPITAL_COMMUNITY): Payer: Medicaid Other | Admitting: Physical Therapy

## 2014-02-27 LAB — GLUCOSE, CAPILLARY
GLUCOSE-CAPILLARY: 288 mg/dL — AB (ref 70–99)
GLUCOSE-CAPILLARY: 220 mg/dL — AB (ref 70–99)
GLUCOSE-CAPILLARY: 84 mg/dL (ref 70–99)
Glucose-Capillary: 128 mg/dL — ABNORMAL HIGH (ref 70–99)
Glucose-Capillary: 159 mg/dL — ABNORMAL HIGH (ref 70–99)

## 2014-02-27 NOTE — Progress Notes (Signed)
Physical Therapy Session Note  Patient Details  Name: UNNAMED ZEIEN MRN: [id number] Date of Birth: Feb 22, 1986  Today's Date: 02/27/2014 Time: 2481-8590 Time Calculation (min): 25 min  Short Term Goals: Week 1:  PT Short Term Goal 1 (Week 1): Patient will perfom Bed Mobility Max-assist x 2 PT Short Term Goal 1 - Progress (Week 1): Met PT Short Term Goal 2 (Week 1): Patient will perform Transfers Max-assist x 2 PT Short Term Goal 2 - Progress (Week 1): Met PT Short Term Goal 3 (Week 1): Dietitian with Verbal Understanding PT Short Term Goal 3 - Progress (Week 1): Progressing toward goal PT Short Term Goal 4 (Week 1): Static Sitting Balance EOB with Max-assist PT Short Term Goal 4 - Progress (Week 1): Met  Skilled Therapeutic Interventions/Progress Updates:  Pt was seen bedside in the pm. Donned leg straps and abdominal binder. Pt transferred supine to edge of bed with head of bed elevated, side rail and mod A. Pt transferred edge of bed to w/c with sliding board and mod A. Pt initially c/o dizziness when transferred to w/c which subsided when w/c tilted on space. Pt propelled w/c with B UEs about 100 feet with increased time.   Therapy Documentation Precautions:  Precautions Precautions: Fall Precaution Comments: sacral breakdown-boost when in w/c, abdominal binder and TEDs due to low BP. Required Braces or Orthoses: Other Brace/Splint Other Brace/Splint: Bilateral Prevalon boots at night Restrictions Weight Bearing Restrictions: No General: Amount of Missed PT Time (min): 20 Minutes Missed Time Reason: Nursing care Vital Signs:   Pain: No c/o pain.   See FIM for current functional status  Therapy/Group: Individual Therapy  Dub Amis 02/27/2014, 3:23 PM

## 2014-02-27 NOTE — Progress Notes (Signed)
   Subjective/Complaints: Patient says he feels well today. No specific complaints except he states his peripheral neuropathy in his fingers seems to be worse. He used to take higher dose of Lyrica.   Objective: Vital Signs: Blood pressure 126/78, pulse 92, temperature 98 F (36.7 C), temperature source Oral, resp. rate 16, weight 127 lb 11.2 oz (57.924 kg), SpO2 96.00%.  Thin male in no acute distress. Chest clear to auscultation. Cardiac exam S1-S2 are regular. Abdominal exam thin, and bowel sounds, soft.  Assessment/Plan: 1. 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 DVT Prophylaxis/Anticoagulation: Pharmaceutical: Lovenox  3. Chronic Pain Management: Will increase his evening dose of Lyrica. 4. Mood: Seems to be reasonably well controlled today.    5. Neuropsych: This patient is capable of making decisions on his own behalf.  6. Spinal cord infarct: steroids off-   7. DM type 2 with gastroparesis: increase PM lantus to 20 units given persistent low cbg's-- . Now on 5u novolog units tid ac for meal coverage.    -appreciate Dr. Cordelia Pen one-time consultation CBG (last 3)   Recent Labs  02/26/14 2102 02/27/14 0300 02/27/14 0738  GLUCAP 233* 288* 220*   8. ChronicBLE wounds with chronically elevated ESR: continue doxycline for 30 days (started 02/02/14). Follow up with ID after discharge. Continue local wound care.   -right elbow granulating in  -wet-dry with allevyn over right popliteal area 9. Constipation/diabetic gastroparesis/neurogenic bowel: 10. Severe diabetic peripheral neuropathy: Increase nighttime pregabalin 11. Pulmonary:  deep breathing, IS/FV, CPAP 12. Low grade temp:   -l results.  13. CV---added midodrine low dose to help sustain bp when up during the day which seems to help Blood pressures have ranged  88- 146/61-91.   LOS (Days) 22 A FACE TO FACE EVALUATION WAS PERFORMED  Bruce H Swords 02/27/2014, 9:21 AM

## 2014-02-28 ENCOUNTER — Inpatient Hospital Stay (HOSPITAL_COMMUNITY): Payer: Self-pay | Admitting: Physical Therapy

## 2014-02-28 LAB — GLUCOSE, CAPILLARY
GLUCOSE-CAPILLARY: 173 mg/dL — AB (ref 70–99)
GLUCOSE-CAPILLARY: 98 mg/dL (ref 70–99)
GLUCOSE-CAPILLARY: 219 mg/dL — AB (ref 70–99)
Glucose-Capillary: 200 mg/dL — ABNORMAL HIGH (ref 70–99)
Glucose-Capillary: 197 mg/dL — ABNORMAL HIGH (ref 70–99)

## 2014-02-28 MED ORDER — PREGABALIN 50 MG PO CAPS
75.0000 mg | ORAL_CAPSULE | Freq: Every day | ORAL | Status: DC
  Administered 2014-03-01 – 2014-03-02 (×2): 75 mg via ORAL
  Filled 2014-02-28 (×4): qty 1

## 2014-02-28 MED ORDER — PREGABALIN 50 MG PO CAPS
100.0000 mg | ORAL_CAPSULE | Freq: Every day | ORAL | Status: DC
  Administered 2014-02-28 – 2014-03-01 (×2): 100 mg via ORAL
  Filled 2014-02-28 (×3): qty 2

## 2014-02-28 NOTE — Progress Notes (Signed)
   Subjective/Complaints: Patient is in good spirits today. Only complaints are that his fingers feel "cold".   Objective: Vital Signs: Blood pressure 105/74, pulse 88, temperature 97.5 F (36.4 C), temperature source Axillary, resp. rate 19, weight 127 lb 11.2 oz (57.924 kg), SpO2 100.00%.  Thin male in no acute distress. Chest clear to auscultation. Cardiac exam S1-S2 are regular. Abdominal exam thin, and bowel sounds, soft.  Assessment/Plan: 1. 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 DVT Prophylaxis/Anticoagulation: Pharmaceutical: Lovenox  3. Chronic Pain Management: Will increase his evening dose of Lyrica. 4. Mood: Seems to be reasonably well controlled today.    5. Neuropsych: This patient is capable of making decisions on his own behalf.  6. Spinal cord infarct: steroids off-   7. DM type 2 with gastroparesis: increase PM lantus to 20 units given persistent low cbg's-- . Now on 5u novolog units tid ac for meal coverage.   CBG (last 3)   Recent Labs  02/27/14 2011 02/28/14 0316 02/28/14 0740  GLUCAP 159* 200* 173*   8. ChronicBLE wounds with chronically elevated ESR: continue doxycline for 30 days (started 02/02/14). Follow up with ID after discharge. Continue local wound care.   -right elbow granulating in  -wet-dry with allevyn over right popliteal area 9. Constipation/diabetic gastroparesis/neurogenic bowel: 10. Severe diabetic peripheral neuropathy: Increase nighttime pregabalin 11. Pulmonary:  deep breathing, IS/FV, CPAP 12. Low grade temp:   -l results.  13. CV---added midodrine low dose to help sustain bp when up during the day which seems to help Blood pressures have ranged 88- 146/61-91.   LOS (Days) 23 A FACE TO FACE EVALUATION WAS PERFORMED  Aysha Livecchi H Dovid Bartko 02/28/2014, 9:05 AM

## 2014-02-28 NOTE — Progress Notes (Signed)
Physical Therapy Session Note  Patient Details  Name: RANEN DOOLIN MRN: [id number] Date of Birth: [birth year]  Today's Date: 02/28/2014 Time: 3007-6226 Time Calculation (min): 45 min  Short Term Goals: Week 3:  PT Short Term Goal 1 (Week 3): pt will self direct set up for sliding board transfers independently PT Short Term Goal 2 (Week 3): pt will tolerate sitting edge of bed for functional activity x 5 minutes with min A PT Short Term Goal 3 (Week 3): pt will perform bed mobility with min A  Skilled Therapeutic Interventions/Progress Updates:   Pt received reclined in w/c, abdominal binder loose due to eating previously, mother present for session. Pt with c/o tightness around diaphragm level that takes his breath away at times. Pt instructed in diaphragmatic breathing with visual demo of one hand on chest (should not move) and one hand on stomach (should rise and fall with inspiration/expiration) to strengthen diaphragm muscle and improve mobility around area of "tightness." Donned abdominal binder and w/c brought level to improve forward trunk lean. Pt reporting feeling "spacy" and BP noted to be low (see details below) and improved immediately upon tilting w/c back. RN notified of readings. Due to low BP, pt performed UE exercises in room with lightweight dumbbell for ROM and strengthening. Pt attempted to hold small weighted ball for exercise but unable to grasp. Dumbbell left in room for patient to use as he seemed to enjoy UE exercises. Pt left tilted in w/c with mom present.   Therapy Documentation Precautions:  Precautions Precautions: Fall Precaution Comments: sacral breakdown-boost when in w/c, abdominal binder and TEDs due to low BP. Required Braces or Orthoses: Other Brace/Splint Other Brace/Splint: Bilateral Prevalon boots at night Restrictions Weight Bearing Restrictions: No Vital Signs: BP 79/51 (RN notified)  Position: Sitting (w/c level) BP: 103/70 mmHg Patient  Position (if appropriate): Sitting (w/c tilted) Pain:  Denies pain  See FIM for current functional status  Therapy/Group: Individual Therapy  Laretta Alstrom 02/28/2014, 5:14 PM

## 2014-03-01 ENCOUNTER — Inpatient Hospital Stay (HOSPITAL_COMMUNITY): Payer: Medicaid Other | Admitting: *Deleted

## 2014-03-01 ENCOUNTER — Inpatient Hospital Stay (HOSPITAL_COMMUNITY): Payer: Medicaid Other | Admitting: Physical Therapy

## 2014-03-01 ENCOUNTER — Encounter (HOSPITAL_COMMUNITY): Payer: Self-pay

## 2014-03-01 ENCOUNTER — Inpatient Hospital Stay (HOSPITAL_COMMUNITY): Payer: Self-pay

## 2014-03-01 LAB — GLUCOSE, RANDOM: Glucose, Bld: 454 mg/dL — ABNORMAL HIGH (ref 70–99)

## 2014-03-01 LAB — GLUCOSE, CAPILLARY
GLUCOSE-CAPILLARY: 277 mg/dL — AB (ref 70–99)
GLUCOSE-CAPILLARY: 155 mg/dL — AB (ref 70–99)
Glucose-Capillary: 375 mg/dL — ABNORMAL HIGH (ref 70–99)
Glucose-Capillary: 358 mg/dL — ABNORMAL HIGH (ref 70–99)
Glucose-Capillary: 549 mg/dL — ABNORMAL HIGH (ref 70–99)
Glucose-Capillary: 377 mg/dL — ABNORMAL HIGH (ref 70–99)

## 2014-03-01 MED ORDER — INSULIN ASPART 100 UNIT/ML ~~LOC~~ SOLN
8.0000 [IU] | Freq: Three times a day (TID) | SUBCUTANEOUS | Status: DC
  Administered 2014-03-01 – 2014-03-05 (×12): 8 [IU] via SUBCUTANEOUS

## 2014-03-01 MED ORDER — PANTOPRAZOLE SODIUM 40 MG PO TBEC
40.0000 mg | DELAYED_RELEASE_TABLET | Freq: Every day | ORAL | Status: DC
  Administered 2014-03-02 – 2014-03-05 (×4): 40 mg via ORAL
  Filled 2014-03-01 (×4): qty 1

## 2014-03-01 MED ORDER — INSULIN GLARGINE 100 UNIT/ML ~~LOC~~ SOLN
25.0000 [IU] | Freq: Every day | SUBCUTANEOUS | Status: DC
  Administered 2014-03-02: 25 [IU] via SUBCUTANEOUS
  Filled 2014-03-01: qty 0.25

## 2014-03-01 MED ORDER — MIDODRINE HCL 5 MG PO TABS
5.0000 mg | ORAL_TABLET | Freq: Two times a day (BID) | ORAL | Status: DC
  Administered 2014-03-01 – 2014-03-05 (×9): 5 mg via ORAL
  Filled 2014-03-01 (×10): qty 1

## 2014-03-01 NOTE — Progress Notes (Signed)
Physical Therapy Note  Patient Details  Name: Jason [name] MRN: [id number] Date of Birth: 05/02/1986 Today's Date: 03/01/2014  Individual Session [phone]) No pain complaints in session  Patient seen at bedside with mother present.  Reports needs to work on toilet transfers.  Discussed plan for OT to work on this.  Patient educated how to perform seated boost in manual chair crossing leg over and leaning of one hip at a time and pt performed using leg loops and leaning off one hip at a time for 1-2 minutes.  Patient boosted back in chair with cues and min/mod assist x 3 trials while chair in upright position.  Seated reaching activity with min assist from wheelchair.  Seated manual stretch to LE's hamstrings, hip rotators and extensors x 25 sec hold 1-2 reps each side.  Remained at bedside in wheelchair mother present and call bell in reach.  [name] 03/01/2014, 4:28 PM

## 2014-03-01 NOTE — Progress Notes (Signed)
Physical Therapy Note  Patient Details  Name: ARK AGRUSA MRN: [id number] Date of Birth: September 13, 1986 Today's Date: 03/01/2014  Time: [phone] 53 minutes  1:1 Pt with no c/o pain. Initial portion of session missed due to bowel issues.  Pt educated on importance of pumping up roho cushion to prevent skin break down and sores, especially when he feels discomfort when seated on cushion.  Pt performed bed mobility with min A with leg loops and bed rails. Pt/mother educated on and performed bed mobility with cuing from PT for positioning and safe techniques and body mechanics.  Pt/mother attempt to perform sliding board transfer.  PT needed to provide max A and cuing for safety and to prevent fall.   Pt/mother encouraged to only provide transfers when they feel safe and comfortable, PT recommends hoyer lift transfers for safety at this time. PT then assisted pt with sliding board transfer with mod A to w/c.  Educated pt/mother on positioning in w/c for pressure relief and comfort.    Time 2: 1355-1425 30 minutes  1:1 No c/o pain.  Pt performed lateral leans for pressure relief and wt shifting to scoot hips A/P in w/c.  Multiple attempts with improvement noted with repetition, min A for A/P scooting in chair.  UBE for UE strength and activity tolerance 2 x 2 minute forward and 2 x 90 seconds backwards.  Pt requires frequent rests due to UE fatigue.   Danae Orleans [place]erth 03/01/2014, 11:14 AM

## 2014-03-01 NOTE — Progress Notes (Signed)
Subjective/Complaints: Still having upper back pain.  A 12 point review of systems has been performed and if not noted above is otherwise negative.   Objective: Vital Signs: Blood pressure 111/75, pulse 96, temperature 100.8 F (38.2 C), temperature source Oral, resp. rate 18, weight 57.924 kg (127 lb 11.2 oz), SpO2 96.00%. No results found. Results for orders placed during the hospital encounter of 02/05/14 (from the past 72 hour(s))  GLUCOSE, CAPILLARY     Status: Abnormal   Collection Time    02/26/14 11:39 AM      Result Value Ref Range   Glucose-Capillary 220 (*) 70 - 99 mg/dL  GLUCOSE, CAPILLARY     Status: Abnormal   Collection Time    02/26/14  4:39 PM      Result Value Ref Range   Glucose-Capillary 215 (*) 70 - 99 mg/dL  GLUCOSE, CAPILLARY     Status: Abnormal   Collection Time    02/26/14  9:02 PM      Result Value Ref Range   Glucose-Capillary 233 (*) 70 - 99 mg/dL  GLUCOSE, CAPILLARY     Status: Abnormal   Collection Time    02/27/14  3:00 AM      Result Value Ref Range   Glucose-Capillary 288 (*) 70 - 99 mg/dL  GLUCOSE, CAPILLARY     Status: Abnormal   Collection Time    02/27/14  7:38 AM      Result Value Ref Range   Glucose-Capillary 220 (*) 70 - 99 mg/dL  GLUCOSE, CAPILLARY     Status: None   Collection Time    02/27/14 12:05 PM      Result Value Ref Range   Glucose-Capillary 84  70 - 99 mg/dL  GLUCOSE, CAPILLARY     Status: Abnormal   Collection Time    02/27/14  5:00 PM      Result Value Ref Range   Glucose-Capillary 128 (*) 70 - 99 mg/dL  GLUCOSE, CAPILLARY     Status: Abnormal   Collection Time    02/27/14  8:11 PM      Result Value Ref Range   Glucose-Capillary 159 (*) 70 - 99 mg/dL  GLUCOSE, CAPILLARY     Status: Abnormal   Collection Time    02/28/14  3:16 AM      Result Value Ref Range   Glucose-Capillary 200 (*) 70 - 99 mg/dL  GLUCOSE, CAPILLARY     Status: Abnormal   Collection Time    02/28/14  7:40 AM      Result Value Ref  Range   Glucose-Capillary 173 (*) 70 - 99 mg/dL  GLUCOSE, CAPILLARY     Status: Abnormal   Collection Time    02/28/14 11:58 AM      Result Value Ref Range   Glucose-Capillary 197 (*) 70 - 99 mg/dL  GLUCOSE, CAPILLARY     Status: None   Collection Time    02/28/14  5:05 PM      Result Value Ref Range   Glucose-Capillary 98  70 - 99 mg/dL  GLUCOSE, CAPILLARY     Status: Abnormal   Collection Time    02/28/14  8:58 PM      Result Value Ref Range   Glucose-Capillary 219 (*) 70 - 99 mg/dL   Comment 1 Notify RN    GLUCOSE, CAPILLARY     Status: Abnormal   Collection Time    03/01/14  3:02 AM      Result Value  Ref Range   Glucose-Capillary 375 (*) 70 - 99 mg/dL  GLUCOSE, CAPILLARY     Status: Abnormal   Collection Time    03/01/14  7:32 AM      Result Value Ref Range   Glucose-Capillary 358 (*) 70 - 99 mg/dL     HEENT: normal Cardio: RRR and no murmur Resp: CTA B/L and unlabored GI: BS positive and mildly distended and tympanitic, mild tenderness Extremity:  No Edema Skin:  . Neuro: Alert/Oriented, Abnormal Sensory absent in feet and Abnormal Motor 0/5 in LE,0/5 finger extension and 0/5 hand intrinsic. Wrist extension 2-, 3/5 tricep   2, biceps 5/5. Decreased sensation below elbows bilaterally but able to ID LT in Left C6-7 dermatomes. ?trace HF on right. Musc/Skel:  Generalized upper thoracic spine tenderness.  Gen NAD   Assessment/Plan: 1. 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 Physiatrist is providing close team supervision and 24 hour management of active medical problems listed below. Physiatrist and rehab team continue to assess barriers to discharge/monitor patient progress toward functional and medical goals.   FIM: FIM - Bathing Bathing Steps Patient Completed: Chest;Abdomen Bathing:  (performed with his mother)  FIM - Upper Body  Dressing/Undressing Upper body dressing/undressing steps patient completed: Thread/unthread right sleeve of pullover shirt/dresss;Thread/unthread left sleeve of pullover shirt/dress;Put head through opening of pull over shirt/dress Upper body dressing/undressing: 4: Min-Patient completed 75 plus % of tasks FIM - Lower Body Dressing/Undressing Lower body dressing/undressing steps patient completed: Thread/unthread right pants leg;Thread/unthread left pants leg;Pull pants up/down;Don/Doff right sock;Don/Doff left sock Lower body dressing/undressing: 1: Total-Patient completed less than 25% of tasks  FIM - Toileting Toileting: 0: Activity did not occur  FIM - Air cabin crew Transfers: 0-Activity did not occur  FIM - Control and instrumentation engineer Devices: Arm rests;HOB elevated;Bed rails;Sliding board Bed/Chair Transfer: 0: Activity did not occur (low BP)  FIM - Locomotion: Wheelchair Distance: 25 Locomotion: Wheelchair: 0: Activity did not occur FIM - Locomotion: Ambulation Locomotion: Ambulation: 0: Activity did not occur  Comprehension Comprehension Mode: Auditory Comprehension: 6-Follows complex conversation/direction: With extra time/assistive device  Expression Expression Mode: Verbal Expression: 6-Expresses complex ideas: With extra time/assistive device  Social Interaction Social Interaction: 6-Interacts appropriately with others with medication or extra time (anti-anxiety, antidepressant).  Problem Solving Problem Solving: 6-Solves complex problems: With extra time  Memory Memory: 6-More than reasonable amt of time  Medical Problem List and Plan:  1. 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 DVT Prophylaxis/Anticoagulation: Pharmaceutical: Lovenox  3. Chronic Pain Management: increase MS contin to 45 mg bid. Air bed for back and buttock pain as well as pressure relief  measures.      -reminded him to use heating pad for shoulder/neck symptoms, good posture when in bed and chair  -continues robaxin. TPI x5 performed yesterday with good results to left periscapular area and traps  -his clavicle fracture also in this general area with distraction laterally---denies any pain in this area.  -lyrica 75m bid for neuropathic pain 4. Mood: reviewed anxiety with patient. Discussed triggers and ways to de-escalate. Also have added prn xanax if he is unable to manage it. 5. Neuropsych: This patient is capable of making decisions on his own behalf.  6. Spinal cord infarct: steroids off-   7. DM type 2 with gastroparesis: increased lantus to 25 units qhs (gradually increase as needed) . increase  novolog to 8u units tid ac  -hold on sliding scale at present due to volatile readings.    -appreciate Dr. Cordelia Pen one-time consultation 8. ChronicBLE wounds with chronically elevated ESR: continue doxycline for 30 days (started 02/02/14). Follow up with ID after discharge. Continue local wound care.   -right elbow granulating in  -wet-dry with allevyn over right popliteal area 9. Constipation/diabetic gastroparesis/neurogenic bowel: reglan causing cramping--dc . Bowel program - D/C miralx, cont colace and bisacodyl supp 10. Severe diabetic peripheral neuropathy:resume neurontin 118m bid an 408m qhs   11. Pulmonary:  deep breathing, IS/FV, CPAP -quad cough, assistive techniques  -keep hob at 30 degrees  -chest PT 12. Low grade temp:   -labs ok ,cxr ok  -IS, observation 13. CV---increase midodrine to 18m43m LOS (Days) 24 A FACE TO FACE EVALUATION WAS PERFORMED  ZacMeredith Staggers25/2015, 9:58 AM

## 2014-03-01 NOTE — Progress Notes (Signed)
Occupational Therapy Session Note  Patient Details  Name: Jason [name] MRN: [id number] Date of Birth: June 23, 1986  Today's Date: 03/01/2014 Time: 1115-1215   1st session Time Calculation (min): 60 min  Short Term Goals: Week 1:  OT Short Term Goal 1 (Week 1): Pt will tolerate sitting on EOB for 10 min with mod A. OT Short Term Goal 1 - Progress (Week 1): Progressing toward goal OT Short Term Goal 2 (Week 1): Pt will use bath mit to wash face and chest. OT Short Term Goal 2 - Progress (Week 1): Met OT Short Term Goal 3 (Week 1): Pt will use a built up spoon to self feed with min A. OT Short Term Goal 3 - Progress (Week 1): Met Week 2:  OT Short Term Goal 1 (Week 2): Sitting Balance:  Max assist EOB for 10 min OT Short Term Goal 1 - Progress (Week 2): Met OT Short Term Goal 2 (Week 2): UB Dressing:  Patient will pull shirt down in the back with min assist for stability OT Short Term Goal 2 - Progress (Week 2): Met OT Short Term Goal 3 (Week 2): LB dressing:  Patient will donn pants over feet in long/circle sitting with min assist OT Short Term Goal 3 - Progress (Week 2): Met OT Short Term Goal 4 (Week 2): Grooming: patient will brush teeth sitting in w/c at sink with min assist for set up PRN OT Short Term Goal 4 - Progress (Week 2): Progressing toward goal OT Short Term Goal 5 (Week 2): Communication: Patient will demonstrate use of hospital phone with supervision OT Short Term Goal 5 - Progress (Week 2): Met Week 3:  OT Short Term Goal 1 (Week 3): STG=LTGs Week 4:     Skilled Therapeutic Interventions/Progress Updates:     1st session:   Pt. In wc upon arrival.  Stated he had a BM.  Transferred to bed with sliding board with total assist +2.  Pt instructed to lead session with what he has learned over past few weeks.  Pt. Rolled side to side in bed using rails.  Mom present during session.  After performing peri care, pt agreed to get back in wc.  Used sliding board to transfer to  wc.  Left in wc with safety belt on and chair tilted at 20 degrees.  Pt. Verbalized the sliding board placement and chair position with minimal cues for initiation.   2nd session:  Time:1500-1530  (30 min) Pain:back pain 4/10 Individual session Pt. In wc upon OT arrival.  Address lateral weight shift, locking/unlocking brakes, BUE activity and exercises, pressure relief.  Pt. Sat at high/low table and engaged in   Bilateral stretching and functional manipulation with hands.  He maneuvered checkers onto board with increased time and lateral grasp/wrist extension.  Pt. Performed weight shifting during activities and sat with anterior weight shift.  Pt. Placed back in room with Mom in room.    Therapy Documentation Precautions:  Precautions Precautions: Fall Precaution Comments: sacral breakdown-boost when in w/c, abdominal binder and TEDs due to low BP. Required Braces or Orthoses: Other Brace/Splint Other Brace/Splint: Bilateral Prevalon boots at night Restrictions Weight Bearing Restrictions: No General:   Vital Signs: Therapy Vitals BP: 97/67 mmHg Patient Position (if appropriate): Sitting Oxygen Therapy SpO2: 100 % O2 Device: None (Room air) Pain:  Pain in back with transfers  (1st session)      See FIM for current functional status  Therapy/Group: Individual Therapy  Lisa Roca  03/01/2014, 12:44 PM

## 2014-03-01 NOTE — Progress Notes (Signed)
Noted lantus ordered at 25 units with 8 units meal coverage novolog as requested.  Thank you.  Pt still needs to have a correction scale ordered as well though-sensitive tidwc and HS scale - until we can establish more specifically the insulin needs.  Thank you, Rosita Kea, RN, CNS, Diabetes Coordinator [phone])

## 2014-03-01 NOTE — Progress Notes (Signed)
Patient's blood glucose is 549 per accucheck; Pam Love, PA notified and orders a STAT blood glucose by lab.  Will continue to monitor.

## 2014-03-02 ENCOUNTER — Inpatient Hospital Stay (HOSPITAL_COMMUNITY): Payer: Medicaid Other | Admitting: Occupational Therapy

## 2014-03-02 ENCOUNTER — Inpatient Hospital Stay (HOSPITAL_COMMUNITY): Payer: Medicaid Other | Admitting: Physical Therapy

## 2014-03-02 LAB — GLUCOSE, CAPILLARY
GLUCOSE-CAPILLARY: 411 mg/dL — AB (ref 70–99)
GLUCOSE-CAPILLARY: 385 mg/dL — AB (ref 70–99)
GLUCOSE-CAPILLARY: 165 mg/dL — AB (ref 70–99)
Glucose-Capillary: 158 mg/dL — ABNORMAL HIGH (ref 70–99)
Glucose-Capillary: 89 mg/dL (ref 70–99)

## 2014-03-02 MED ORDER — FLEET ENEMA 7-19 GM/118ML RE ENEM: 1.0000 | ENEMA | Freq: Every day | RECTAL | Status: DC | PRN

## 2014-03-02 MED ORDER — INSULIN GLARGINE 100 UNIT/ML ~~LOC~~ SOLN
30.0000 [IU] | Freq: Every day | SUBCUTANEOUS | Status: DC
  Administered 2014-03-02 – 2014-03-03 (×2): 30 [IU] via SUBCUTANEOUS
  Filled 2014-03-02 (×3): qty 0.3

## 2014-03-02 MED ORDER — PREGABALIN 50 MG PO CAPS
75.0000 mg | ORAL_CAPSULE | Freq: Two times a day (BID) | ORAL | Status: DC
  Administered 2014-03-02 – 2014-03-04 (×4): 75 mg via ORAL
  Filled 2014-03-02 (×8): qty 1

## 2014-03-02 MED ORDER — SENNA 8.6 MG PO TABS
2.0000 | ORAL_TABLET | Freq: Every day | ORAL | Status: DC
  Administered 2014-03-02: 17.2 mg via ORAL
  Filled 2014-03-02 (×2): qty 2

## 2014-03-02 NOTE — Progress Notes (Signed)
Physical Therapy Note  Patient Details  Name: Jason [name] MRN: [id number] Date of Birth: 12-06-1985 Today's Date: 03/02/2014  Time: 1100-1130 30 minutes (missed 30 minutes skilled PT due to bowel issues)  1:1 Pt c/o bowel discomfort, RN aware.  Session focused on physical ROM and posture exam for power w/c evaluation.  Pt performed supine to sit without cues with mod A, pt with c/o dizziness upon sitting, eases with supine rest.  Pt close supervision for static sitting balance during posture assessment.  Pt with fatigue this session due to being up all night due to bowel issues.  Educated pt's mother on importance of ROM exercises at home to maintain flexibility, she expresses understanding.   Time 2: 1300-1338 38 minutes  1:1 Pt continues with c/o bowel issues, no c/o pain.  Treatment focused on pt/family education with pt's mother on mechanical hoyer lift transfers bed <> w/c.  Pt's mother able to perform transfer and set up w/c correctly without cuing.  Pt's mother checked off on hoyer lift transfers and encouraged to use this as the ONLY method of transfers as she is not safe for sliding board transfers at this time.  RN made aware.  [name] 03/02/2014, 11:45 AM

## 2014-03-02 NOTE — Consult Note (Addendum)
WOC wound follow up Wound type: Right elbow full thickness wound continues to improve.   Measurement: 3X4X.1cm Wound bed: 100% beefy red. Drainage (amount, consistency, odor) Mod amt green drainage, no odor Periwound: Intact skin surrounding. Dressing procedure/placement/frequency: Continue Aquacel to absorb drainage and provide antimicrobial benefits until discharge from hospital with foam dressing and stockinet to hold in place.  Discussed plan of care with patient and mother at bedside.  Antibiotic ointment and gauze can be used after discharge if pt does not have home health follow-up. If home health is following, then Aquacel can be continued until drainage decreased to wound.They deny further questions at this time regarding topical treatment. Please re-consult if further assistance is needed.  Thank-you,  Julien Girt MSN, [place], [place], [place], [place]

## 2014-03-02 NOTE — Progress Notes (Signed)
Discussed with pt and mother about scheduled bowel program. Discussed digital stimulation technique with pt refusing and not receptive to idea.  Mother did not have any input. Pt continues to have soft BMs throughout morning.  Will continue to educate pt and mother on purpose and outcomes. Mother states she is already aware and educated on suppository and bowel program. Mother did not want further education.

## 2014-03-02 NOTE — Progress Notes (Signed)
Nursing Note: Gave mylicon,diet sprite and xanax per pt's request.Also scanned bladder for 35 cc,though pt has an indwelling foley and checked for kinks and there were none.Educated pt to call if symptoms become worse.Pt resting quietly in bed at this time.

## 2014-03-02 NOTE — Progress Notes (Signed)
Nursing Note: Pt 's cbg is 7.A: Notified on-call Deretha Emory.No new orders received.No treatment at this time as pt presents aa a brittle diabetic.Will continue to monitor.wbb

## 2014-03-02 NOTE — Progress Notes (Signed)
NUTRITION FOLLOW UP DOCUMENTATION CODES  Per approved criteria   -Severe malnutrition in the context of chronic illness    Intervention:   1. General healthful diet; encourage intake of foods and beverages as able. RD to follow and assess for nutritional adequacy.  2.  Meals/snacks; pt requesting additional carb allowance for breakfast.  For now, will ask for cheese toast with breakfast.  Recommend diet liberalization to Regular.    NUTRITION DIAGNOSIS:  Inadequate oral intake related to poor appetite as evidenced by pt report.   Monitor:  1. Food/Beverage; pt meeting >/=90% estimated needs with tolerance.  2. Wt/wt change; monitor trends  Assessment:   Pt with h/o Type 1 diabetes, diabetic neuropathy with sensory deficits, as well as BLE diabetic ulcers, as well as gastroparesis admitted with lethargy and inability to ambulate. 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 Spinal cord infarct suspected at this time.   Pt continues to eat well completing 75-100% of meals consistently.    RD met with pt and mom.  They have questions about maximizing CHO intake at meals due to restriction.  Pt is currently eating well without need for additional interventions or supplements at this time.   Question whether CHO restriction is appropriate for pt with Type 1 diabetes and severe malnutrition on meal coverage.  Recommend consider liberalizing CHO allowance to Regular diet with adequate coverage.   Height: Ht Readings from Last 1 Encounters:  01/30/14 5' 7.5" (1.715 m)    Weight Status:   Wt Readings from Last 1 Encounters:  03/02/14 131 lb 6.3 oz (59.6 kg)    Re-estimated needs:  Kcal: 1800-2000  Protein: 70-80 grams  Fluid: 2 L/day  Skin: non-pitting edema  Diet Order: Carb Control   Intake/Output Summary (Last 24 hours) at 03/02/14 1101 Last data filed at 03/02/14 1051  Gross per 24 hour  Intake    840 ml  Output   2250 ml  Net  -1410 ml    Last BM:  5/25  Labs:   Recent Labs Lab 03/01/14 1156  GLUCOSE 454*    CBG (last 3)   Recent Labs  03/01/14 2103 03/02/14 0253 03/02/14 0649  GLUCAP 155* 411* 385*    Scheduled Meds: . bisacodyl  10 mg Rectal Q2000  . camphor-menthol   Topical BID  . docusate sodium  100 mg Oral BID  . doxycycline  100 mg Oral Q12H  . enoxaparin (LOVENOX) injection  40 mg Subcutaneous Q24H  . insulin aspart  8 Units Subcutaneous TID WC  . insulin glargine  30 Units Subcutaneous QHS  . lidocaine  1 patch Transdermal Q24H  . loratadine  10 mg Oral Daily  . metoCLOPramide  5 mg Oral Q breakfast  . midodrine  5 mg Oral BID WC  . morphine  45 mg Oral Q12H  . pantoprazole  40 mg Oral Daily  . pregabalin  75 mg Oral BID  . saccharomyces boulardii  250 mg Oral BID    Continuous Infusions:   Brynda Greathouse, MS RD LDN Clinical Inpatient Dietitian Pager: [phone] Weekend/After hours pager: [phone]

## 2014-03-02 NOTE — Progress Notes (Signed)
Subjective/Complaints: Hand pain worse. Had "bulging" in his belly yesterday A 12 point review of systems has been performed and if not noted above is otherwise negative.   Objective: Vital Signs: Blood pressure 111/73, pulse 94, temperature 98.1 F (36.7 C), temperature source Oral, resp. rate 18, weight 59.6 kg (131 lb 6.3 oz), SpO2 99.00%. No results found. Results for orders placed during the hospital encounter of 02/05/14 (from the past 72 hour(s))  GLUCOSE, CAPILLARY     Status: None   Collection Time    02/27/14 12:05 PM      Result Value Ref Range   Glucose-Capillary 84  70 - 99 mg/dL  GLUCOSE, CAPILLARY     Status: Abnormal   Collection Time    02/27/14  5:00 PM      Result Value Ref Range   Glucose-Capillary 128 (*) 70 - 99 mg/dL  GLUCOSE, CAPILLARY     Status: Abnormal   Collection Time    02/27/14  8:11 PM      Result Value Ref Range   Glucose-Capillary 159 (*) 70 - 99 mg/dL  GLUCOSE, CAPILLARY     Status: Abnormal   Collection Time    02/28/14  3:16 AM      Result Value Ref Range   Glucose-Capillary 200 (*) 70 - 99 mg/dL  GLUCOSE, CAPILLARY     Status: Abnormal   Collection Time    02/28/14  7:40 AM      Result Value Ref Range   Glucose-Capillary 173 (*) 70 - 99 mg/dL  GLUCOSE, CAPILLARY     Status: Abnormal   Collection Time    02/28/14 11:58 AM      Result Value Ref Range   Glucose-Capillary 197 (*) 70 - 99 mg/dL  GLUCOSE, CAPILLARY     Status: None   Collection Time    02/28/14  5:05 PM      Result Value Ref Range   Glucose-Capillary 98  70 - 99 mg/dL  GLUCOSE, CAPILLARY     Status: Abnormal   Collection Time    02/28/14  8:58 PM      Result Value Ref Range   Glucose-Capillary 219 (*) 70 - 99 mg/dL   Comment 1 Notify RN    GLUCOSE, CAPILLARY     Status: Abnormal   Collection Time    03/01/14  3:02 AM      Result Value Ref Range   Glucose-Capillary 375 (*) 70 - 99 mg/dL  GLUCOSE, CAPILLARY     Status: Abnormal   Collection Time    03/01/14   7:32 AM      Result Value Ref Range   Glucose-Capillary 358 (*) 70 - 99 mg/dL  GLUCOSE, CAPILLARY     Status: Abnormal   Collection Time    03/01/14 11:19 AM      Result Value Ref Range   Glucose-Capillary 549 (*) 70 - 99 mg/dL  GLUCOSE, RANDOM     Status: Abnormal   Collection Time    03/01/14 11:56 AM      Result Value Ref Range   Glucose, Bld 454 (*) 70 - 99 mg/dL  GLUCOSE, CAPILLARY     Status: Abnormal   Collection Time    03/01/14  1:24 PM      Result Value Ref Range   Glucose-Capillary 377 (*) 70 - 99 mg/dL  GLUCOSE, CAPILLARY     Status: Abnormal   Collection Time    03/01/14  4:21 PM  Result Value Ref Range   Glucose-Capillary 277 (*) 70 - 99 mg/dL   Comment 1 Notify RN    GLUCOSE, CAPILLARY     Status: Abnormal   Collection Time    03/01/14  9:03 PM      Result Value Ref Range   Glucose-Capillary 155 (*) 70 - 99 mg/dL  GLUCOSE, CAPILLARY     Status: Abnormal   Collection Time    03/02/14  2:53 AM      Result Value Ref Range   Glucose-Capillary 411 (*) 70 - 99 mg/dL   Comment 1 Notify RN    GLUCOSE, CAPILLARY     Status: Abnormal   Collection Time    03/02/14  6:49 AM      Result Value Ref Range   Glucose-Capillary 385 (*) 70 - 99 mg/dL     HEENT: normal Cardio: RRR and no murmur Resp: CTA B/L and unlabored GI: BS positive and mildly distended and tympanitic, mild tenderness Extremity:  No Edema Skin:  . Neuro: Alert/Oriented, Abnormal Sensory absent in feet and Abnormal Motor 0/5 in LE,0/5 finger extension and 0/5 hand intrinsic. Wrist extension 2-, 3/5 tricep   2, biceps 5/5. Decreased sensation below elbows bilaterally but able to ID LT in Left C6-7 dermatomes. ?trace HF on right. Musc/Skel:  Generalized upper thoracic spine tenderness.  Gen NAD   Assessment/Plan: 1. 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 Physiatrist is providing close  team supervision and 24 hour management of active medical problems listed below. Physiatrist and rehab team continue to assess barriers to discharge/monitor patient progress toward functional and medical goals.   FIM: FIM - Bathing Bathing Steps Patient Completed: Chest;Abdomen Bathing:  (performed with his mother)  FIM - Upper Body Dressing/Undressing Upper body dressing/undressing steps patient completed: Thread/unthread right sleeve of pullover shirt/dresss;Thread/unthread left sleeve of pullover shirt/dress;Put head through opening of pull over shirt/dress Upper body dressing/undressing: 4: Min-Patient completed 75 plus % of tasks FIM - Lower Body Dressing/Undressing Lower body dressing/undressing steps patient completed: Thread/unthread right pants leg;Thread/unthread left pants leg;Pull pants up/down;Don/Doff right sock;Don/Doff left sock Lower body dressing/undressing: 1: Total-Patient completed less than 25% of tasks  FIM - Toileting Toileting: 0: Activity did not occur  FIM - Air cabin crew Transfers: 0-Activity did not occur  FIM - Control and instrumentation engineer Devices: Arm rests;HOB elevated;Bed rails;Sliding board Bed/Chair Transfer: 2: Bed > Chair or W/C: Max A (lift and lower assist);3: Supine > Sit: Mod A (lifting assist/Pt. 50-74%/lift 2 legs;2: Sit > Supine: Max A (lifting assist/Pt. 25-49%);2: Chair or W/C > Bed: Max A (lift and lower assist);1: Two helpers (sliding board used swith Mom assiting)  FIM - Locomotion: Wheelchair Distance: 25 Locomotion: Wheelchair: 0: Activity did not occur FIM - Locomotion: Ambulation Locomotion: Ambulation: 0: Activity did not occur  Comprehension Comprehension Mode: Auditory Comprehension: 6-Follows complex conversation/direction: With extra time/assistive device  Expression Expression Mode: Verbal Expression: 6-Expresses complex ideas: With extra time/assistive device  Social Interaction Social  Interaction: 6-Interacts appropriately with others with medication or extra time (anti-anxiety, antidepressant).  Problem Solving Problem Solving: 5-Solves complex 90% of the time/cues < 10% of the time  Memory Memory: 6-More than reasonable amt of time  Medical Problem List and Plan:  1. 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 DVT Prophylaxis/Anticoagulation: Pharmaceutical: Lovenox  3. Chronic Pain Management: increase MS contin to  45 mg bid. Air bed for back and buttock pain as well as pressure relief measures.      -reminded him to use heating pad for shoulder/neck symptoms, good posture when in bed and chair  -continues robaxin. TPI x5 performed yesterday with good results to left periscapular area and traps  -his clavicle fracture also in this general area with distraction laterally---denies any pain in this area.  -lyrica 54m bid for neuropathic pain 4. Mood: reviewed anxiety with patient. Discussed triggers and ways to de-escalate. Also have added prn xanax if he is unable to manage it. 5. Neuropsych: This patient is capable of making decisions on his own behalf.  6. Spinal cord infarct: steroids off-   7. DM type 2 with gastroparesis: increased lantus to 25 units qhs (gradually increase as needed) . increase novolog to 8u units tid ac  -hold on sliding scale at present due to volatile readings.    -appreciate Dr. ECordelia Penone-time consultation 8. ChronicBLE wounds with chronically elevated ESR: continue doxycline for 30 days (started 02/02/14). Follow up with ID after discharge. Continue local wound care.   -right elbow granulating in  -wet-dry with allevyn over right popliteal area 9. Constipation/diabetic gastroparesis/neurogenic bowel: reglan causing cramping--dc . Bowel program - D/C miralx, cont colace and bisacodyl supp 10. Severe diabetic peripheral neuropathy:resume neurontin 1079mbid an 40017m qhs   11. Pulmonary:  deep breathing, IS/FV, CPAP -quad cough, assistive techniques  -keep hob at 30 degrees  -chest PT 12. Low grade temp:   -labs ok ,cxr ok  -IS, observation 13. CV---increased midodrine to 5mg74mLOS (Days) 25 A FACE TO FACE EVALUATION WAS PERFORMED  ZachMeredith Staggers6/2015, 8:12 AM      Subjective/Complaints: Still having upper back pain.  A 12 point review of systems has been performed and if not noted above is otherwise negative.   Objective: Vital Signs: Blood pressure 111/73, pulse 94, temperature 98.1 F (36.7 C), temperature source Oral, resp. rate 18, weight 59.6 kg (131 lb 6.3 oz), SpO2 99.00%. No results found. Results for orders placed during the hospital encounter of 02/05/14 (from the past 72 hour(s))  GLUCOSE, CAPILLARY     Status: None   Collection Time    02/27/14 12:05 PM      Result Value Ref Range   Glucose-Capillary 84  70 - 99 mg/dL  GLUCOSE, CAPILLARY     Status: Abnormal   Collection Time    02/27/14  5:00 PM      Result Value Ref Range   Glucose-Capillary 128 (*) 70 - 99 mg/dL  GLUCOSE, CAPILLARY     Status: Abnormal   Collection Time    02/27/14  8:11 PM      Result Value Ref Range   Glucose-Capillary 159 (*) 70 - 99 mg/dL  GLUCOSE, CAPILLARY     Status: Abnormal   Collection Time    02/28/14  3:16 AM      Result Value Ref Range   Glucose-Capillary 200 (*) 70 - 99 mg/dL  GLUCOSE, CAPILLARY     Status: Abnormal   Collection Time    02/28/14  7:40 AM      Result Value Ref Range   Glucose-Capillary 173 (*) 70 - 99 mg/dL  GLUCOSE, CAPILLARY     Status: Abnormal   Collection Time    02/28/14 11:58 AM      Result Value Ref Range   Glucose-Capillary 197 (*) 70 - 99 mg/dL  GLUCOSE, CAPILLARY  Status: None   Collection Time    02/28/14  5:05 PM      Result Value Ref Range   Glucose-Capillary 98  70 - 99 mg/dL  GLUCOSE, CAPILLARY     Status: Abnormal   Collection Time    02/28/14  8:58 PM      Result Value  Ref Range   Glucose-Capillary 219 (*) 70 - 99 mg/dL   Comment 1 Notify RN    GLUCOSE, CAPILLARY     Status: Abnormal   Collection Time    03/01/14  3:02 AM      Result Value Ref Range   Glucose-Capillary 375 (*) 70 - 99 mg/dL  GLUCOSE, CAPILLARY     Status: Abnormal   Collection Time    03/01/14  7:32 AM      Result Value Ref Range   Glucose-Capillary 358 (*) 70 - 99 mg/dL  GLUCOSE, CAPILLARY     Status: Abnormal   Collection Time    03/01/14 11:19 AM      Result Value Ref Range   Glucose-Capillary 549 (*) 70 - 99 mg/dL  GLUCOSE, RANDOM     Status: Abnormal   Collection Time    03/01/14 11:56 AM      Result Value Ref Range   Glucose, Bld 454 (*) 70 - 99 mg/dL  GLUCOSE, CAPILLARY     Status: Abnormal   Collection Time    03/01/14  1:24 PM      Result Value Ref Range   Glucose-Capillary 377 (*) 70 - 99 mg/dL  GLUCOSE, CAPILLARY     Status: Abnormal   Collection Time    03/01/14  4:21 PM      Result Value Ref Range   Glucose-Capillary 277 (*) 70 - 99 mg/dL   Comment 1 Notify RN    GLUCOSE, CAPILLARY     Status: Abnormal   Collection Time    03/01/14  9:03 PM      Result Value Ref Range   Glucose-Capillary 155 (*) 70 - 99 mg/dL  GLUCOSE, CAPILLARY     Status: Abnormal   Collection Time    03/02/14  2:53 AM      Result Value Ref Range   Glucose-Capillary 411 (*) 70 - 99 mg/dL   Comment 1 Notify RN    GLUCOSE, CAPILLARY     Status: Abnormal   Collection Time    03/02/14  6:49 AM      Result Value Ref Range   Glucose-Capillary 385 (*) 70 - 99 mg/dL     HEENT: normal Cardio: RRR and no murmur Resp: CTA B/L and unlabored GI: BS positive and mildly distended and tympanitic, mild tenderness Extremity:  No Edema Skin:  . Neuro: Alert/Oriented, Abnormal Sensory absent in feet and Abnormal Motor 0/5 in LE,0/5 finger extension and 0/5 hand intrinsic. Wrist extension 2-, 3/5 tricep   2, biceps 5/5. Decreased sensation below elbows bilaterally but able to ID LT in Left C6-7  dermatomes. ?trace HF on right. Musc/Skel:  Generalized upper thoracic spine tenderness.  Gen NAD   Assessment/Plan: 1. 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 Physiatrist is providing close team supervision and 24 hour management of active medical problems listed below. Physiatrist and rehab team continue to assess barriers to discharge/monitor patient progress toward functional and medical goals.   FIM: FIM - Bathing Bathing Steps Patient Completed: Chest;Abdomen Bathing:  (performed with his mother)  FIM - Upper Body Dressing/Undressing Upper body dressing/undressing steps patient completed: Thread/unthread right sleeve of pullover shirt/dresss;Thread/unthread left sleeve of pullover shirt/dress;Put head through opening of pull over shirt/dress Upper body dressing/undressing: 4: Min-Patient completed 75 plus % of tasks FIM - Lower Body Dressing/Undressing Lower body dressing/undressing steps patient completed: Thread/unthread right pants leg;Thread/unthread left pants leg;Pull pants up/down;Don/Doff right sock;Don/Doff left sock Lower body dressing/undressing: 1: Total-Patient completed less than 25% of tasks  FIM - Toileting Toileting: 0: Activity did not occur  FIM - Air cabin crew Transfers: 0-Activity did not occur  FIM - Control and instrumentation engineer Devices: Arm rests;HOB elevated;Bed rails;Sliding board Bed/Chair Transfer: 2: Bed > Chair or W/C: Max A (lift and lower assist);3: Supine > Sit: Mod A (lifting assist/Pt. 50-74%/lift 2 legs;2: Sit > Supine: Max A (lifting assist/Pt. 25-49%);2: Chair or W/C > Bed: Max A (lift and lower assist);1: Two helpers (sliding board used swith Mom assiting)  FIM - Locomotion: Wheelchair Distance: 25 Locomotion: Wheelchair: 0: Activity did not occur FIM - Locomotion: Ambulation Locomotion: Ambulation:  0: Activity did not occur  Comprehension Comprehension Mode: Auditory Comprehension: 6-Follows complex conversation/direction: With extra time/assistive device  Expression Expression Mode: Verbal Expression: 6-Expresses complex ideas: With extra time/assistive device  Social Interaction Social Interaction: 6-Interacts appropriately with others with medication or extra time (anti-anxiety, antidepressant).  Problem Solving Problem Solving: 5-Solves complex 90% of the time/cues < 10% of the time  Memory Memory: 6-More than reasonable amt of time  Medical Problem List and Plan:  1. 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 DVT Prophylaxis/Anticoagulation: Pharmaceutical: Lovenox  3. Chronic Pain Management: increase MS contin to 45 mg bid. Air bed for back and buttock pain as well as pressure relief measures.      -reminded him to use heating pad for shoulder/neck symptoms, good posture when in bed and chair  -continues robaxin. TPI x5 performed yesterday with good results to left periscapular area and traps  -his clavicle fracture also in this general area with distraction laterally---denies any pain in this area.  -lyrica 45m bid for neuropathic pain---increase to TID 4. Mood: reviewed anxiety with patient. Discussed triggers and ways to de-escalate. Also have added prn xanax if he is unable to manage it. 5. Neuropsych: This patient is capable of making decisions on his own behalf.  6. Spinal cord infarct: steroids off-   7. DM type 2 with gastroparesis: increase lantus to 30u qhs . increased novolog to 8u units tid ac yesterday  -hold on sliding scale at present due to volatile readings.    -appreciate Dr. ECordelia Penone-time consultation 8. ChronicBLE wounds with chronically elevated ESR: continue doxycline for 30 days (started 02/02/14). Follow up with ID after discharge. Continue local wound care.   -right  elbow granulating in  -wet-dry with allevyn over right popliteal area 9. Constipation/diabetic gastroparesis/neurogenic bowel: reglan causing cramping--dc . Bowel program - D/C miralx, cont colace and bisacodyl supp 10. Severe diabetic peripheral neuropathy:resume neurontin 1052mbid an 40057mqhs   11. Pulmonary:  deep breathing, IS/FV, CPAP -quad cough, assistive techniques  -keep hob at 30 degrees  -chest PT 12. Low grade temp:   -labs ok ,cxr ok  -IS, observation 13. CV---increase midodrine to 5mg61mLOS (Days) 25 A FACE TO FACE EVALUATION WAS PERFORMED  ZachMeredith Staggers6/2015, 8:12 AM

## 2014-03-02 NOTE — Progress Notes (Signed)
Occupational Therapy Session Notes  Patient Details  Name: Jason [name] MRN: [id number] Date of Birth: Mar 12, 1986  Today's Date: 03/02/2014 Time: 0905-1005 and 245-255 Time Calculation (min): 60 min and 10 min (missed 20 min due to incontinent of bowel)  Short Term Goals: Week 3:  OT Short Term Goal 1 (Week 3): STG=LTGs  Skilled Therapeutic Interventions/Progress Updates:  1)  Patient up in w/c upon arrival and very sleepy.  [name] reports that she got him out of bed this morning without the abdominal binder therefore this OT assisted her to donn binder.  Reviewed with patient that he is also responsible to remind mother or any caregiver that binder goes on before get out of bed due to occasional drop in BP.  Patient then realized that his hips were not far enough bak cin the w/c then requested that this OT assist to position his hips back in the w/c.  Asked patient to guide this OT through steps to assist him and he required min cues for technique and method for guiding.  Patient requested to brush teeth at sink therefore assisted patient to think about where he will brush teeth at home-kitchen at first-therefore, positioned w/c along side of cabinets/sink and patient completed task to include set up with supervision and vcs.  Continue discussion regarding bowel issues secondary to patient and mother state that both were up all night secondary to just continue to have BM throughout the night. Mother stated that BM was "formed" however when she was asked to describe stool consistency, she stated soft like peanut butter. Provided education regarding formed vs. Soft and also referred them to both read the section of SCI manual regarding bowel program.  Also encouraged patient not to eat so much fruit secondary to this may be adding to softening the stool.  2)  Patient sleeping in bed upon arrival.  Patient reports feeling so sleepy that he cannot keep his eyes open.  Declined to perform  [facility] lift transfers bed><padded tub bench with cut out due bowel accident and requesting nursing to clean up.  Mother states, "they just cleaned you up, now you have had another one?"  Reviewed discharge planning issues to include team recommendation to only use hoyer lift transfer at home, bowel program issues, mode of transportation for discharge home (ambulance).  Addressed patient's question regarding riding in a car: PT to review car transfer training and safety while seated in car.  Patient having difficulty staying awake during discussion.  Therapy Documentation Precautions:  Precautions Precautions: Fall Precaution Comments: sacral breakdown-boost when in w/c, abdominal binder and TEDs due to low BP. Required Braces or Orthoses: Other Brace/Splint Other Brace/Splint: Bilateral Prevalon boots at night Restrictions Weight Bearing Restrictions: No Pain: 1)  left mid-back (rhomboids), not rated, stretching, repositioned, and soft tissue massage 2)  No report of pain ADL: See FIM for current functional status  Therapy/Group: Individual Therapy both sessions  Gaye Pollack 03/02/2014, 10:46 AM

## 2014-03-02 NOTE — Progress Notes (Signed)
Nursing Note: Pt called and complained feeling short of breath.Pt states that it feels like he has a band across his abdomen that is preventing him from taking a deep breath.PO2 98% on r/a.T-100.3 p-102 R-17 BP-130/60.Pt states he just doesn't feel well.Palpated abd. gently and pt felt tender to r lower mid-abd. Pt states he could see a big bulge to his belly and tried to push it down with his pillow.Pt has active bowel sounds in all four quadrants of abd.Pt's mom at the bedside and states this is not the first time he has felt this and says that it has happened since he had his injury.Pt has had multiple stools on previous shifts ,so he refused his dulcolax supp.that was due earlier.I asked if he wanted to reconsider as that may help and pt still declined.A: Notified on-call Janet Berlin and obtained order for protonix.Will offer mylicon and continue to monitor.wbb

## 2014-03-03 ENCOUNTER — Inpatient Hospital Stay (HOSPITAL_COMMUNITY): Payer: Medicaid Other | Admitting: Occupational Therapy

## 2014-03-03 ENCOUNTER — Ambulatory Visit: Payer: Self-pay | Admitting: Internal Medicine

## 2014-03-03 ENCOUNTER — Inpatient Hospital Stay (HOSPITAL_COMMUNITY): Payer: Medicaid Other | Admitting: Physical Therapy

## 2014-03-03 DIAGNOSIS — E291 Testicular hypofunction: Secondary | ICD-10-CM

## 2014-03-03 DIAGNOSIS — L03119 Cellulitis of unspecified part of limb: Secondary | ICD-10-CM

## 2014-03-03 DIAGNOSIS — L02419 Cutaneous abscess of limb, unspecified: Secondary | ICD-10-CM

## 2014-03-03 DIAGNOSIS — K3184 Gastroparesis: Secondary | ICD-10-CM

## 2014-03-03 DIAGNOSIS — G8250 Quadriplegia, unspecified: Secondary | ICD-10-CM

## 2014-03-03 DIAGNOSIS — G9519 Other vascular myelopathies: Secondary | ICD-10-CM

## 2014-03-03 DIAGNOSIS — E1149 Type 2 diabetes mellitus with other diabetic neurological complication: Secondary | ICD-10-CM

## 2014-03-03 LAB — BASIC METABOLIC PANEL
BUN: 37 mg/dL — AB (ref 6–23)
CO2: 24 mEq/L (ref 19–32)
Calcium: 10.9 mg/dL — ABNORMAL HIGH (ref 8.4–10.5)
Chloride: 104 mEq/L (ref 96–112)
Creatinine, Ser: 0.83 mg/dL (ref 0.50–1.35)
GFR calc non Af Amer: >90 mL/min (ref >90)
GLUCOSE: 227 mg/dL — AB (ref 70–99)
POTASSIUM: 4.4 meq/L (ref 3.7–5.3)
Sodium: 139 mEq/L (ref 137–147)

## 2014-03-03 LAB — CBC WITH DIFFERENTIAL/PLATELET
BASOS PCT: 1 % (ref 0–1)
Basophils Absolute: 0.1 10*3/uL (ref 0.0–0.1)
Eosinophils Absolute: 0.3 10*3/uL (ref 0.0–0.7)
Eosinophils Relative: 3 % (ref 0–5)
HCT: 27.4 % — ABNORMAL LOW (ref 39.0–52.0)
HEMOGLOBIN: 8.2 g/dL — AB (ref 13.0–17.0)
LYMPHS ABS: 3.7 10*3/uL (ref 0.7–4.0)
LYMPHS PCT: 37 % (ref 12–46)
MCH: 24.8 pg — ABNORMAL LOW (ref 26.0–34.0)
MCHC: 29.9 g/dL — ABNORMAL LOW (ref 30.0–36.0)
MCV: 82.8 fL (ref 78.0–100.0)
MONOS PCT: 15 % — AB (ref 3–12)
Monocytes Absolute: 1.5 10*3/uL — ABNORMAL HIGH (ref 0.1–1.0)
Neutro Abs: 4.5 10*3/uL (ref 1.7–7.7)
Neutrophils Relative %: 44 % (ref 43–77)
Platelets: 605 10*3/uL — ABNORMAL HIGH (ref 150–400)
RBC: 3.31 MIL/uL — ABNORMAL LOW (ref 4.22–5.81)
RDW: 16.2 % — ABNORMAL HIGH (ref 11.5–15.5)
Smear Review: INCREASED
WBC: 10.1 10*3/uL (ref 4.0–10.5)

## 2014-03-03 LAB — GLUCOSE, CAPILLARY
GLUCOSE-CAPILLARY: 234 mg/dL — AB (ref 70–99)
GLUCOSE-CAPILLARY: 84 mg/dL (ref 70–99)
GLUCOSE-CAPILLARY: 151 mg/dL — AB (ref 70–99)
Glucose-Capillary: 215 mg/dL — ABNORMAL HIGH (ref 70–99)
Glucose-Capillary: 110 mg/dL — ABNORMAL HIGH (ref 70–99)

## 2014-03-03 MED ORDER — METHOCARBAMOL 500 MG PO TABS: 750.0000 mg | ORAL_TABLET | Freq: Four times a day (QID) | ORAL | Status: DC | PRN

## 2014-03-03 MED ORDER — METHOCARBAMOL 500 MG PO TABS
500.0000 mg | ORAL_TABLET | Freq: Once | ORAL | Status: AC
  Administered 2014-03-03: 500 mg via ORAL
  Filled 2014-03-03: qty 1

## 2014-03-03 NOTE — Progress Notes (Signed)
Physical Therapy Note  Patient Details  Name: VINICIO LYNK MRN: [id number] Date of Birth: [birth year] Today's Date: 03/03/2014  Time: 1030-1125 55 minutes  1:1 No c/o pain.  Treatment session focused on pt's concerns for d/c home. Pt/mother educated on and problem solved through furniture transfers.  Unable to hoyer lift transfer to couch but able to transfer to chair.  Discussed having pt being able to put feet up and perform pressure relief.  Pt able to perform with supervision for set up, no physical assist.  Pt/mother expressed understanding of need for 2 minutes of pressure relief every 20 minutes.  Pt encouraged to put timer on his phone.  Problem solving mobility and function in the kitchen. Pt assisted with problem solving and performed reaching items in refrigerator, cabinets and counter level, pt/mother express understanding of safety with kitchen set up.   Time: 1300-1330 30 minutes  1:1 No c/o pain.  Treatment focused on sitting balance and wt shifts all directions in w/c with pt reaching and tossing bean bags. Pt supervision for balance, good problem solving and use of w/c parts and leg loops to provide stability with reaching out of BOS.  [name] 03/03/2014, 11:25 AM

## 2014-03-03 NOTE — Plan of Care (Signed)
Problem: RH PAIN MANAGEMENT Goal: RH STG PAIN MANAGED AT OR BELOW PT'S PAIN GOAL Pain level <5 on a scale of 0-10.  Outcome: Not Progressing Patient reports pain as 9/10

## 2014-03-03 NOTE — Progress Notes (Signed)
Subjective/Complaints: Having abdominal spasms. Stool still soft/mushy. Refused dig stim. A 12 point review of systems has been performed and if not noted above is otherwise negative.   Objective: Vital Signs: Blood pressure 114/80, pulse 84, temperature 98.3 F (36.8 C), temperature source Oral, resp. rate 18, weight 58.4 kg (128 lb 12 oz), SpO2 94.00%. No results found. Results for orders placed during the hospital encounter of 02/05/14 (from the past 72 hour(s))  GLUCOSE, CAPILLARY     Status: Abnormal   Collection Time    02/28/14 11:58 AM      Result Value Ref Range   Glucose-Capillary 197 (*) 70 - 99 mg/dL  GLUCOSE, CAPILLARY     Status: None   Collection Time    02/28/14  5:05 PM      Result Value Ref Range   Glucose-Capillary 98  70 - 99 mg/dL  GLUCOSE, CAPILLARY     Status: Abnormal   Collection Time    02/28/14  8:58 PM      Result Value Ref Range   Glucose-Capillary 219 (*) 70 - 99 mg/dL   Comment 1 Notify RN    GLUCOSE, CAPILLARY     Status: Abnormal   Collection Time    03/01/14  3:02 AM      Result Value Ref Range   Glucose-Capillary 375 (*) 70 - 99 mg/dL  GLUCOSE, CAPILLARY     Status: Abnormal   Collection Time    03/01/14  7:32 AM      Result Value Ref Range   Glucose-Capillary 358 (*) 70 - 99 mg/dL  GLUCOSE, CAPILLARY     Status: Abnormal   Collection Time    03/01/14 11:19 AM      Result Value Ref Range   Glucose-Capillary 549 (*) 70 - 99 mg/dL  GLUCOSE, RANDOM     Status: Abnormal   Collection Time    03/01/14 11:56 AM      Result Value Ref Range   Glucose, Bld 454 (*) 70 - 99 mg/dL  GLUCOSE, CAPILLARY     Status: Abnormal   Collection Time    03/01/14  1:24 PM      Result Value Ref Range   Glucose-Capillary 377 (*) 70 - 99 mg/dL  GLUCOSE, CAPILLARY     Status: Abnormal   Collection Time    03/01/14  4:21 PM      Result Value Ref Range   Glucose-Capillary 277 (*) 70 - 99 mg/dL   Comment 1 Notify RN    GLUCOSE, CAPILLARY     Status:  Abnormal   Collection Time    03/01/14  9:03 PM      Result Value Ref Range   Glucose-Capillary 155 (*) 70 - 99 mg/dL  GLUCOSE, CAPILLARY     Status: Abnormal   Collection Time    03/02/14  2:53 AM      Result Value Ref Range   Glucose-Capillary 411 (*) 70 - 99 mg/dL   Comment 1 Notify RN    GLUCOSE, CAPILLARY     Status: Abnormal   Collection Time    03/02/14  6:49 AM      Result Value Ref Range   Glucose-Capillary 385 (*) 70 - 99 mg/dL  GLUCOSE, CAPILLARY     Status: Abnormal   Collection Time    03/02/14 11:31 AM      Result Value Ref Range   Glucose-Capillary 158 (*) 70 - 99 mg/dL  GLUCOSE, CAPILLARY     Status:  None   Collection Time    03/02/14  4:21 PM      Result Value Ref Range   Glucose-Capillary 89  70 - 99 mg/dL  GLUCOSE, CAPILLARY     Status: Abnormal   Collection Time    03/02/14  9:18 PM      Result Value Ref Range   Glucose-Capillary 165 (*) 70 - 99 mg/dL  GLUCOSE, CAPILLARY     Status: Abnormal   Collection Time    03/03/14  2:54 AM      Result Value Ref Range   Glucose-Capillary 234 (*) 70 - 99 mg/dL  BASIC METABOLIC PANEL     Status: Abnormal   Collection Time    03/03/14  5:00 AM      Result Value Ref Range   Sodium 139  137 - 147 mEq/L   Potassium 4.4  3.7 - 5.3 mEq/L   Chloride 104  96 - 112 mEq/L   CO2 24  19 - 32 mEq/L   Glucose, Bld 227 (*) 70 - 99 mg/dL   BUN 37 (*) 6 - 23 mg/dL   Creatinine, Ser 0.83  0.50 - 1.35 mg/dL   Calcium 10.9 (*) 8.4 - 10.5 mg/dL   GFR calc non Af Amer >90  >90 mL/min   GFR calc Af Amer >90  >90 mL/min   Comment: (NOTE)     The eGFR has been calculated using the CKD EPI equation.     This calculation has not been validated in all clinical situations.     eGFR's persistently <90 mL/min signify possible Chronic Kidney     Disease.  CBC WITH DIFFERENTIAL     Status: Abnormal (Preliminary result)   Collection Time    03/03/14  5:00 AM      Result Value Ref Range   WBC 10.1  4.0 - 10.5 K/uL   RBC 3.31 (*) 4.22  - 5.81 MIL/uL   Hemoglobin 8.2 (*) 13.0 - 17.0 g/dL   Comment: REPEATED TO VERIFY   HCT 27.4 (*) 39.0 - 52.0 %   MCV 82.8  78.0 - 100.0 fL   MCH 24.8 (*) 26.0 - 34.0 pg   MCHC 29.9 (*) 30.0 - 36.0 g/dL   RDW 16.2 (*) 11.5 - 15.5 %   Platelets 605 (*) 150 - 400 K/uL   Comment: REPEATED TO VERIFY   Neutrophils Relative % PENDING  43 - 77 %   Neutro Abs PENDING  1.7 - 7.7 K/uL   Band Neutrophils PENDING  0 - 10 %   Lymphocytes Relative PENDING  12 - 46 %   Lymphs Abs PENDING  0.7 - 4.0 K/uL   Monocytes Relative PENDING  3 - 12 %   Monocytes Absolute PENDING  0.1 - 1.0 K/uL   Eosinophils Relative PENDING  0 - 5 %   Eosinophils Absolute PENDING  0.0 - 0.7 K/uL   Basophils Relative PENDING  0 - 1 %   Basophils Absolute PENDING  0.0 - 0.1 K/uL   WBC Morphology PENDING     RBC Morphology PENDING     Smear Review PENDING     nRBC PENDING  0 /100 WBC   Metamyelocytes Relative PENDING     Myelocytes PENDING     Promyelocytes Absolute PENDING     Blasts PENDING    GLUCOSE, CAPILLARY     Status: Abnormal   Collection Time    03/03/14  7:11 AM      Result Value Ref  Range   Glucose-Capillary 215 (*) 70 - 99 mg/dL     HEENT: normal Cardio: RRR and no murmur Resp: CTA B/L and unlabored GI: BS positive and mildly distended and tympanitic, mild tenderness Extremity:  No Edema Skin: legs improved. Elbow granulating nicely. Tiny stage 2 at sacrum Neuro: Alert/Oriented, Abnormal Sensory absent in feet and Abnormal Motor 0/5 in LE,0/5 finger extension and 0/5 hand intrinsic. Wrist extension 2-, 3/5 tricep   2, biceps 5/5. Decreased sensation below elbows bilaterally but able to ID LT in Left C6-7 dermatomes. ?trace HF on right. Musc/Skel:  Generalized upper thoracic spine tenderness.  Gen NAD   Assessment/Plan: 1. 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 Physiatrist is providing  close team supervision and 24 hour management of active medical problems listed below. Physiatrist and rehab team continue to assess barriers to discharge/monitor patient progress toward functional and medical goals.   FIM: FIM - Bathing Bathing Steps Patient Completed: Chest;Right Arm;Left Arm;Abdomen;Front perineal area Bathing: 3: Mod-Patient completes 5-7 66f10 parts or 50-74% (per patient and patient's mother's report)  FIM - Upper Body Dressing/Undressing Upper body dressing/undressing steps patient completed: Thread/unthread right sleeve of pullover shirt/dresss;Thread/unthread left sleeve of pullover shirt/dress;Put head through opening of pull over shirt/dress Upper body dressing/undressing: 4: Min-Patient completed 75 plus % of tasks FIM - Lower Body Dressing/Undressing Lower body dressing/undressing steps patient completed: Thread/unthread right pants leg;Thread/unthread left pants leg Lower body dressing/undressing: 2: Max-Patient completed 25-49% of tasks (Mod assist with donning pants only (LTG))  FIM - Toileting Toileting: 1: Total-Patient completed zero steps, helper did all 3  FIM - TAir cabin crewTransfers: 0-Activity did not occur  FIM - BControl and instrumentation engineerDevices: Arm rests;HOB elevated;Bed rails;Sliding board Bed/Chair Transfer: 2: Bed > Chair or W/C: Max A (lift and lower assist);3: Supine > Sit: Mod A (lifting assist/Pt. 50-74%/lift 2 legs;2: Sit > Supine: Max A (lifting assist/Pt. 25-49%);2: Chair or W/C > Bed: Max A (lift and lower assist);1: Two helpers (sliding board used swith Mom assiting)  FIM - Locomotion: Wheelchair Distance: 25 Locomotion: Wheelchair: 0: Activity did not occur FIM - Locomotion: Ambulation Locomotion: Ambulation: 0: Activity did not occur  Comprehension Comprehension Mode: Auditory Comprehension: 5-Understands complex 90% of the time/Cues < 10% of the time  Expression Expression Mode:  Verbal Expression: 5-Expresses complex 90% of the time/cues < 10% of the time  Social Interaction Social Interaction: 5-Interacts appropriately 90% of the time - Needs monitoring or encouragement for participation or interaction.  Problem Solving Problem Solving: 5-Solves basic problems: With no assist  Memory Memory: 6-More than reasonable amt of time  Medical Problem List and Plan:  1. 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 DVT Prophylaxis/Anticoagulation: Pharmaceutical: Lovenox  3. Chronic Pain Management: increase MS contin to 45 mg bid. Air bed for back and buttock pain as well as pressure relief measures.      -reminded him to use heating pad for shoulder/neck symptoms, good posture when in bed and chair  -continues robaxin for muscle spasms.   -his clavicle fracture also in this general area with distraction laterally---denies any pain in this area.  -lyrica 779mbid for neuropathic pain 4. Mood: reviewed anxiety with patient. Discussed triggers and ways to de-escalate. Also have added prn xanax if he is unable to manage it. 5. Neuropsych: This patient is capable of making decisions  on his own behalf.  6. Spinal cord infarct: steroids off-   7. DM type 2 with gastroparesis: increased lantus to 25 units qhs (gradually increase as needed) . increase novolog to 8u units tid ac  -hold on sliding scale at present due to volatile readings.    -appreciate Dr. Cordelia Pen one-time consultation  -sugars may be showing some stabilization 8. ChronicBLE wounds with chronically elevated ESR: continue doxycline for 30 days (started 02/02/14). Follow up with ID after discharge. Continue local wound care.   -right elbow granulating in  -wet-dry with allevyn over right popliteal area 9. Constipation/diabetic gastroparesis/neurogenic bowel: reglan causing cramping--dc . Bowel program - D/C miralx, cont colace and bisacodyl  supp 10. Severe diabetic peripheral neuropathy:resume neurontin 1651m bid an 4077m qhs   11. Pulmonary:  deep breathing, IS/FV, CPAP -quad cough, assistive techniques  -keep hob at 30 degrees  -chest PT 12. Low grade temp:   -labs ok ,cxr ok  -IS, observation 13. CV---increased midodrine to 51m9mcontinue binder/teds   LOS (Days) 26 A FACE TO FACE EVALUATION WAS PERFORMED  ZacMeredith Staggers27/2015, 8:38 AM      Subjective/Complaints: Still having upper back pain.  A 12 point review of systems has been performed and if not noted above is otherwise negative.   Objective: Vital Signs: Blood pressure 114/80, pulse 84, temperature 98.3 F (36.8 C), temperature source Oral, resp. rate 18, weight 58.4 kg (128 lb 12 oz), SpO2 94.00%. No results found. Results for orders placed during the hospital encounter of 02/05/14 (from the past 72 hour(s))  GLUCOSE, CAPILLARY     Status: Abnormal   Collection Time    02/28/14 11:58 AM      Result Value Ref Range   Glucose-Capillary 197 (*) 70 - 99 mg/dL  GLUCOSE, CAPILLARY     Status: None   Collection Time    02/28/14  5:05 PM      Result Value Ref Range   Glucose-Capillary 98  70 - 99 mg/dL  GLUCOSE, CAPILLARY     Status: Abnormal   Collection Time    02/28/14  8:58 PM      Result Value Ref Range   Glucose-Capillary 219 (*) 70 - 99 mg/dL   Comment 1 Notify RN    GLUCOSE, CAPILLARY     Status: Abnormal   Collection Time    03/01/14  3:02 AM      Result Value Ref Range   Glucose-Capillary 375 (*) 70 - 99 mg/dL  GLUCOSE, CAPILLARY     Status: Abnormal   Collection Time    03/01/14  7:32 AM      Result Value Ref Range   Glucose-Capillary 358 (*) 70 - 99 mg/dL  GLUCOSE, CAPILLARY     Status: Abnormal   Collection Time    03/01/14 11:19 AM      Result Value Ref Range   Glucose-Capillary 549 (*) 70 - 99 mg/dL  GLUCOSE, RANDOM     Status: Abnormal   Collection Time    03/01/14 11:56 AM      Result Value Ref Range   Glucose,  Bld 454 (*) 70 - 99 mg/dL  GLUCOSE, CAPILLARY     Status: Abnormal   Collection Time    03/01/14  1:24 PM      Result Value Ref Range   Glucose-Capillary 377 (*) 70 - 99 mg/dL  GLUCOSE, CAPILLARY     Status: Abnormal   Collection Time    03/01/14  4:21  PM      Result Value Ref Range   Glucose-Capillary 277 (*) 70 - 99 mg/dL   Comment 1 Notify RN    GLUCOSE, CAPILLARY     Status: Abnormal   Collection Time    03/01/14  9:03 PM      Result Value Ref Range   Glucose-Capillary 155 (*) 70 - 99 mg/dL  GLUCOSE, CAPILLARY     Status: Abnormal   Collection Time    03/02/14  2:53 AM      Result Value Ref Range   Glucose-Capillary 411 (*) 70 - 99 mg/dL   Comment 1 Notify RN    GLUCOSE, CAPILLARY     Status: Abnormal   Collection Time    03/02/14  6:49 AM      Result Value Ref Range   Glucose-Capillary 385 (*) 70 - 99 mg/dL  GLUCOSE, CAPILLARY     Status: Abnormal   Collection Time    03/02/14 11:31 AM      Result Value Ref Range   Glucose-Capillary 158 (*) 70 - 99 mg/dL  GLUCOSE, CAPILLARY     Status: None   Collection Time    03/02/14  4:21 PM      Result Value Ref Range   Glucose-Capillary 89  70 - 99 mg/dL  GLUCOSE, CAPILLARY     Status: Abnormal   Collection Time    03/02/14  9:18 PM      Result Value Ref Range   Glucose-Capillary 165 (*) 70 - 99 mg/dL  GLUCOSE, CAPILLARY     Status: Abnormal   Collection Time    03/03/14  2:54 AM      Result Value Ref Range   Glucose-Capillary 234 (*) 70 - 99 mg/dL  BASIC METABOLIC PANEL     Status: Abnormal   Collection Time    03/03/14  5:00 AM      Result Value Ref Range   Sodium 139  137 - 147 mEq/L   Potassium 4.4  3.7 - 5.3 mEq/L   Chloride 104  96 - 112 mEq/L   CO2 24  19 - 32 mEq/L   Glucose, Bld 227 (*) 70 - 99 mg/dL   BUN 37 (*) 6 - 23 mg/dL   Creatinine, Ser 0.83  0.50 - 1.35 mg/dL   Calcium 10.9 (*) 8.4 - 10.5 mg/dL   GFR calc non Af Amer >90  >90 mL/min   GFR calc Af Amer >90  >90 mL/min   Comment: (NOTE)     The  eGFR has been calculated using the CKD EPI equation.     This calculation has not been validated in all clinical situations.     eGFR's persistently <90 mL/min signify possible Chronic Kidney     Disease.  CBC WITH DIFFERENTIAL     Status: Abnormal (Preliminary result)   Collection Time    03/03/14  5:00 AM      Result Value Ref Range   WBC 10.1  4.0 - 10.5 K/uL   RBC 3.31 (*) 4.22 - 5.81 MIL/uL   Hemoglobin 8.2 (*) 13.0 - 17.0 g/dL   Comment: REPEATED TO VERIFY   HCT 27.4 (*) 39.0 - 52.0 %   MCV 82.8  78.0 - 100.0 fL   MCH 24.8 (*) 26.0 - 34.0 pg   MCHC 29.9 (*) 30.0 - 36.0 g/dL   RDW 16.2 (*) 11.5 - 15.5 %   Platelets 605 (*) 150 - 400 K/uL   Comment: REPEATED TO VERIFY  Neutrophils Relative % PENDING  43 - 77 %   Neutro Abs PENDING  1.7 - 7.7 K/uL   Band Neutrophils PENDING  0 - 10 %   Lymphocytes Relative PENDING  12 - 46 %   Lymphs Abs PENDING  0.7 - 4.0 K/uL   Monocytes Relative PENDING  3 - 12 %   Monocytes Absolute PENDING  0.1 - 1.0 K/uL   Eosinophils Relative PENDING  0 - 5 %   Eosinophils Absolute PENDING  0.0 - 0.7 K/uL   Basophils Relative PENDING  0 - 1 %   Basophils Absolute PENDING  0.0 - 0.1 K/uL   WBC Morphology PENDING     RBC Morphology PENDING     Smear Review PENDING     nRBC PENDING  0 /100 WBC   Metamyelocytes Relative PENDING     Myelocytes PENDING     Promyelocytes Absolute PENDING     Blasts PENDING    GLUCOSE, CAPILLARY     Status: Abnormal   Collection Time    03/03/14  7:11 AM      Result Value Ref Range   Glucose-Capillary 215 (*) 70 - 99 mg/dL     HEENT: normal Cardio: RRR and no murmur Resp: CTA B/L and unlabored GI: BS positive and mildly distended and tympanitic, mild tenderness Extremity:  No Edema Skin:  . Neuro: Alert/Oriented, Abnormal Sensory absent in feet and Abnormal Motor 0/5 in LE,0/5 finger extension and 0/5 hand intrinsic. Wrist extension 2-, 3/5 tricep   2, biceps 5/5. Decreased sensation below elbows bilaterally but  able to ID LT in Left C6-7 dermatomes. ?trace HF on right. Musc/Skel:  Generalized upper thoracic spine tenderness.  Gen NAD   Assessment/Plan: 1. 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 Physiatrist is providing close team supervision and 24 hour management of active medical problems listed below. Physiatrist and rehab team continue to assess barriers to discharge/monitor patient progress toward functional and medical goals.   FIM: FIM - Bathing Bathing Steps Patient Completed: Chest;Right Arm;Left Arm;Abdomen;Front perineal area Bathing: 3: Mod-Patient completes 5-7 39f10 parts or 50-74% (per patient and patient's mother's report)  FIM - Upper Body Dressing/Undressing Upper body dressing/undressing steps patient completed: Thread/unthread right sleeve of pullover shirt/dresss;Thread/unthread left sleeve of pullover shirt/dress;Put head through opening of pull over shirt/dress Upper body dressing/undressing: 4: Min-Patient completed 75 plus % of tasks FIM - Lower Body Dressing/Undressing Lower body dressing/undressing steps patient completed: Thread/unthread right pants leg;Thread/unthread left pants leg Lower body dressing/undressing: 2: Max-Patient completed 25-49% of tasks (Mod assist with donning pants only (LTG))  FIM - Toileting Toileting: 1: Total-Patient completed zero steps, helper did all 3  FIM - TAir cabin crewTransfers: 0-Activity did not occur  FIM - BControl and instrumentation engineerDevices: Arm rests;HOB elevated;Bed rails;Sliding board Bed/Chair Transfer: 2: Bed > Chair or W/C: Max A (lift and lower assist);3: Supine > Sit: Mod A (lifting assist/Pt. 50-74%/lift 2 legs;2: Sit > Supine: Max A (lifting assist/Pt. 25-49%);2: Chair or W/C > Bed: Max A (lift and lower assist);1: Two helpers (sliding board used swith Mom assiting)  FIM -  Locomotion: Wheelchair Distance: 25 Locomotion: Wheelchair: 0: Activity did not occur FIM - Locomotion: Ambulation Locomotion: Ambulation: 0: Activity did not occur  Comprehension Comprehension Mode: Auditory Comprehension: 5-Understands complex 90% of the time/Cues < 10% of the time  Expression Expression Mode: Verbal Expression: 5-Expresses complex 90% of the  time/cues < 10% of the time  Social Interaction Social Interaction: 5-Interacts appropriately 90% of the time - Needs monitoring or encouragement for participation or interaction.  Problem Solving Problem Solving: 5-Solves basic problems: With no assist  Memory Memory: 6-More than reasonable amt of time  Medical Problem List and Plan:  1. 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 DVT Prophylaxis/Anticoagulation: Pharmaceutical: Lovenox  3. Chronic Pain Management: increase MS contin to 45 mg bid. Air bed for back and buttock pain as well as pressure relief measures.      -reminded him to use heating pad for shoulder/neck symptoms, good posture when in bed and chair  -continues robaxin. TPI x5 performed yesterday with good results to left periscapular area and traps  -his clavicle fracture also in this general area with distraction laterally---denies any pain in this area.  -lyrica 30m bid for neuropathic pain---increase to TID 4. Mood: reviewed anxiety with patient. Discussed triggers and ways to de-escalate. Also have added prn xanax if he is unable to manage it. 5. Neuropsych: This patient is capable of making decisions on his own behalf.  6. Spinal cord infarct: steroids off-   7. DM type 2 with gastroparesis: increase lantus to 30u qhs . increased novolog to 8u units tid ac yesterday  -hold on sliding scale at present due to volatile readings.    -appreciate Dr. ECordelia Penone-time consultation 8. ChronicBLE wounds with chronically elevated ESR:  continue doxycline for 30 days (started 02/02/14). Follow up with ID after discharge. Continue local wound care.   -right elbow granulating in  -wet-dry with allevyn over right popliteal area 9. Constipation/diabetic gastroparesis/neurogenic bowel: reglan causing cramping--dc . Bowel program - D/C miralx, cont colace and bisacodyl supp 10. Severe diabetic peripheral neuropathy:resume neurontin 1013mbid an 40067mqhs   11. Pulmonary:  deep breathing, IS/FV, CPAP -quad cough, assistive techniques  -keep hob at 30 degrees  -chest PT 12. Low grade temp:   -labs ok ,cxr ok  -IS, observation 13. CV---increase midodrine to 5mg79mLOS (Days) 26 A FACE TO FACE EVALUATION WAS PERFORMED  ZachMeredith Staggers7/2015, 8:38 AM      Subjective/Complaints: Hand pain worse. Had "bulging" in his belly yesterday A 12 point review of systems has been performed and if not noted above is otherwise negative.   Objective: Vital Signs: Blood pressure 114/80, pulse 84, temperature 98.3 F (36.8 C), temperature source Oral, resp. rate 18, weight 58.4 kg (128 lb 12 oz), SpO2 94.00%. No results found. Results for orders placed during the hospital encounter of 02/05/14 (from the past 72 hour(s))  GLUCOSE, CAPILLARY     Status: Abnormal   Collection Time    02/28/14 11:58 AM      Result Value Ref Range   Glucose-Capillary 197 (*) 70 - 99 mg/dL  GLUCOSE, CAPILLARY     Status: None   Collection Time    02/28/14  5:05 PM      Result Value Ref Range   Glucose-Capillary 98  70 - 99 mg/dL  GLUCOSE, CAPILLARY     Status: Abnormal   Collection Time    02/28/14  8:58 PM      Result Value Ref Range   Glucose-Capillary 219 (*) 70 - 99 mg/dL   Comment 1 Notify RN    GLUCOSE, CAPILLARY     Status: Abnormal   Collection Time    03/01/14  3:02 AM  Result Value Ref Range   Glucose-Capillary 375 (*) 70 - 99 mg/dL  GLUCOSE, CAPILLARY     Status: Abnormal   Collection Time    03/01/14  7:32 AM      Result  Value Ref Range   Glucose-Capillary 358 (*) 70 - 99 mg/dL  GLUCOSE, CAPILLARY     Status: Abnormal   Collection Time    03/01/14 11:19 AM      Result Value Ref Range   Glucose-Capillary 549 (*) 70 - 99 mg/dL  GLUCOSE, RANDOM     Status: Abnormal   Collection Time    03/01/14 11:56 AM      Result Value Ref Range   Glucose, Bld 454 (*) 70 - 99 mg/dL  GLUCOSE, CAPILLARY     Status: Abnormal   Collection Time    03/01/14  1:24 PM      Result Value Ref Range   Glucose-Capillary 377 (*) 70 - 99 mg/dL  GLUCOSE, CAPILLARY     Status: Abnormal   Collection Time    03/01/14  4:21 PM      Result Value Ref Range   Glucose-Capillary 277 (*) 70 - 99 mg/dL   Comment 1 Notify RN    GLUCOSE, CAPILLARY     Status: Abnormal   Collection Time    03/01/14  9:03 PM      Result Value Ref Range   Glucose-Capillary 155 (*) 70 - 99 mg/dL  GLUCOSE, CAPILLARY     Status: Abnormal   Collection Time    03/02/14  2:53 AM      Result Value Ref Range   Glucose-Capillary 411 (*) 70 - 99 mg/dL   Comment 1 Notify RN    GLUCOSE, CAPILLARY     Status: Abnormal   Collection Time    03/02/14  6:49 AM      Result Value Ref Range   Glucose-Capillary 385 (*) 70 - 99 mg/dL  GLUCOSE, CAPILLARY     Status: Abnormal   Collection Time    03/02/14 11:31 AM      Result Value Ref Range   Glucose-Capillary 158 (*) 70 - 99 mg/dL  GLUCOSE, CAPILLARY     Status: None   Collection Time    03/02/14  4:21 PM      Result Value Ref Range   Glucose-Capillary 89  70 - 99 mg/dL  GLUCOSE, CAPILLARY     Status: Abnormal   Collection Time    03/02/14  9:18 PM      Result Value Ref Range   Glucose-Capillary 165 (*) 70 - 99 mg/dL  GLUCOSE, CAPILLARY     Status: Abnormal   Collection Time    03/03/14  2:54 AM      Result Value Ref Range   Glucose-Capillary 234 (*) 70 - 99 mg/dL  BASIC METABOLIC PANEL     Status: Abnormal   Collection Time    03/03/14  5:00 AM      Result Value Ref Range   Sodium 139  137 - 147 mEq/L    Potassium 4.4  3.7 - 5.3 mEq/L   Chloride 104  96 - 112 mEq/L   CO2 24  19 - 32 mEq/L   Glucose, Bld 227 (*) 70 - 99 mg/dL   BUN 37 (*) 6 - 23 mg/dL   Creatinine, Ser 0.83  0.50 - 1.35 mg/dL   Calcium 10.9 (*) 8.4 - 10.5 mg/dL   GFR calc non Af Amer >90  >90 mL/min  GFR calc Af Amer >90  >90 mL/min   Comment: (NOTE)     The eGFR has been calculated using the CKD EPI equation.     This calculation has not been validated in all clinical situations.     eGFR's persistently <90 mL/min signify possible Chronic Kidney     Disease.  CBC WITH DIFFERENTIAL     Status: Abnormal (Preliminary result)   Collection Time    03/03/14  5:00 AM      Result Value Ref Range   WBC 10.1  4.0 - 10.5 K/uL   RBC 3.31 (*) 4.22 - 5.81 MIL/uL   Hemoglobin 8.2 (*) 13.0 - 17.0 g/dL   Comment: REPEATED TO VERIFY   HCT 27.4 (*) 39.0 - 52.0 %   MCV 82.8  78.0 - 100.0 fL   MCH 24.8 (*) 26.0 - 34.0 pg   MCHC 29.9 (*) 30.0 - 36.0 g/dL   RDW 16.2 (*) 11.5 - 15.5 %   Platelets 605 (*) 150 - 400 K/uL   Comment: REPEATED TO VERIFY   Neutrophils Relative % PENDING  43 - 77 %   Neutro Abs PENDING  1.7 - 7.7 K/uL   Band Neutrophils PENDING  0 - 10 %   Lymphocytes Relative PENDING  12 - 46 %   Lymphs Abs PENDING  0.7 - 4.0 K/uL   Monocytes Relative PENDING  3 - 12 %   Monocytes Absolute PENDING  0.1 - 1.0 K/uL   Eosinophils Relative PENDING  0 - 5 %   Eosinophils Absolute PENDING  0.0 - 0.7 K/uL   Basophils Relative PENDING  0 - 1 %   Basophils Absolute PENDING  0.0 - 0.1 K/uL   WBC Morphology PENDING     RBC Morphology PENDING     Smear Review PENDING     nRBC PENDING  0 /100 WBC   Metamyelocytes Relative PENDING     Myelocytes PENDING     Promyelocytes Absolute PENDING     Blasts PENDING    GLUCOSE, CAPILLARY     Status: Abnormal   Collection Time    03/03/14  7:11 AM      Result Value Ref Range   Glucose-Capillary 215 (*) 70 - 99 mg/dL     HEENT: normal Cardio: RRR and no murmur Resp: CTA B/L and  unlabored GI: BS positive and mildly distended and tympanitic, mild tenderness Extremity:  No Edema Skin:  . Neuro: Alert/Oriented, Abnormal Sensory absent in feet and Abnormal Motor 0/5 in LE,0/5 finger extension and 0/5 hand intrinsic. Wrist extension 2-, 3/5 tricep   2, biceps 5/5. Decreased sensation below elbows bilaterally but able to ID LT in Left C6-7 dermatomes. ?trace HF on right. Musc/Skel:  Generalized upper thoracic spine tenderness.  Gen NAD   Assessment/Plan: 1. 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 Physiatrist is providing close team supervision and 24 hour management of active medical problems listed below. Physiatrist and rehab team continue to assess barriers to discharge/monitor patient progress toward functional and medical goals.   FIM: FIM - Bathing Bathing Steps Patient Completed: Chest;Right Arm;Left Arm;Abdomen;Front perineal area Bathing: 3: Mod-Patient completes 5-7 73f10 parts or 50-74% (per patient and patient's mother's report)  FIM - Upper Body Dressing/Undressing Upper body dressing/undressing steps patient completed: Thread/unthread right sleeve of pullover shirt/dresss;Thread/unthread left sleeve of pullover shirt/dress;Put head through opening of pull over shirt/dress Upper body dressing/undressing: 4: Min-Patient  completed 75 plus % of tasks FIM - Lower Body Dressing/Undressing Lower body dressing/undressing steps patient completed: Thread/unthread right pants leg;Thread/unthread left pants leg Lower body dressing/undressing: 2: Max-Patient completed 25-49% of tasks (Mod assist with donning pants only (LTG))  FIM - Toileting Toileting: 1: Total-Patient completed zero steps, helper did all 3  FIM - Air cabin crew Transfers: 0-Activity did not occur  FIM - Control and instrumentation engineer Devices: Arm rests;HOB  elevated;Bed rails;Sliding board Bed/Chair Transfer: 2: Bed > Chair or W/C: Max A (lift and lower assist);3: Supine > Sit: Mod A (lifting assist/Pt. 50-74%/lift 2 legs;2: Sit > Supine: Max A (lifting assist/Pt. 25-49%);2: Chair or W/C > Bed: Max A (lift and lower assist);1: Two helpers (sliding board used swith Mom assiting)  FIM - Locomotion: Wheelchair Distance: 25 Locomotion: Wheelchair: 0: Activity did not occur FIM - Locomotion: Ambulation Locomotion: Ambulation: 0: Activity did not occur  Comprehension Comprehension Mode: Auditory Comprehension: 5-Understands complex 90% of the time/Cues < 10% of the time  Expression Expression Mode: Verbal Expression: 5-Expresses complex 90% of the time/cues < 10% of the time  Social Interaction Social Interaction: 5-Interacts appropriately 90% of the time - Needs monitoring or encouragement for participation or interaction.  Problem Solving Problem Solving: 5-Solves basic problems: With no assist  Memory Memory: 6-More than reasonable amt of time  Medical Problem List and Plan:  1. 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 DVT Prophylaxis/Anticoagulation: Pharmaceutical: Lovenox  3. Chronic Pain Management: increase MS contin to 45 mg bid. Air bed for back and buttock pain as well as pressure relief measures.      -reminded him to use heating pad for shoulder/neck symptoms, good posture when in bed and chair  -continues robaxin. TPI x5 performed yesterday with good results to left periscapular area and traps  -his clavicle fracture also in this general area with distraction laterally---denies any pain in this area.  -lyrica 104m bid for neuropathic pain 4. Mood: reviewed anxiety with patient. Discussed triggers and ways to de-escalate. Also have added prn xanax if he is unable to manage it. 5. Neuropsych: This patient is capable of making decisions on his own behalf.   6. Spinal cord infarct: steroids off-   7. DM type 2 with gastroparesis: increased lantus to 25 units qhs (gradually increase as needed) . increase novolog to 8u units tid ac  -hold on sliding scale at present due to volatile readings.    -appreciate Dr. ECordelia Penone-time consultation 8. ChronicBLE wounds with chronically elevated ESR: continue doxycline for 30 days (started 02/02/14). Follow up with ID after discharge. Continue local wound care.   -right elbow granulating in  -wet-dry with allevyn over right popliteal area 9. Constipation/diabetic gastroparesis/neurogenic bowel: reglan causing cramping--dc . Bowel program - D/C miralx, cont colace and bisacodyl supp 10. Severe diabetic peripheral neuropathy:resume neurontin 1072mbid an 40078mqhs   11. Pulmonary:  deep breathing, IS/FV, CPAP -quad cough, assistive techniques  -keep hob at 30 degrees  -chest PT 12. Low grade temp:   -labs ok ,cxr ok  -IS, observation 13. CV---increased midodrine to 5mg20mLOS (Days) 26 A FACE TO FACE EVALUATION WAS PERFORMED  ZachMeredith Staggers7/2015, 8:38 AM      Subjective/Complaints: Still having upper back pain.  A 12 point review of systems has been performed and if not noted above is otherwise negative.   Objective: Vital Signs: Blood pressure 114/80, pulse 84,  temperature 98.3 F (36.8 C), temperature source Oral, resp. rate 18, weight 58.4 kg (128 lb 12 oz), SpO2 94.00%. No results found. Results for orders placed during the hospital encounter of 02/05/14 (from the past 72 hour(s))  GLUCOSE, CAPILLARY     Status: Abnormal   Collection Time    02/28/14 11:58 AM      Result Value Ref Range   Glucose-Capillary 197 (*) 70 - 99 mg/dL  GLUCOSE, CAPILLARY     Status: None   Collection Time    02/28/14  5:05 PM      Result Value Ref Range   Glucose-Capillary 98  70 - 99 mg/dL  GLUCOSE, CAPILLARY     Status: Abnormal   Collection Time    02/28/14  8:58 PM      Result Value Ref  Range   Glucose-Capillary 219 (*) 70 - 99 mg/dL   Comment 1 Notify RN    GLUCOSE, CAPILLARY     Status: Abnormal   Collection Time    03/01/14  3:02 AM      Result Value Ref Range   Glucose-Capillary 375 (*) 70 - 99 mg/dL  GLUCOSE, CAPILLARY     Status: Abnormal   Collection Time    03/01/14  7:32 AM      Result Value Ref Range   Glucose-Capillary 358 (*) 70 - 99 mg/dL  GLUCOSE, CAPILLARY     Status: Abnormal   Collection Time    03/01/14 11:19 AM      Result Value Ref Range   Glucose-Capillary 549 (*) 70 - 99 mg/dL  GLUCOSE, RANDOM     Status: Abnormal   Collection Time    03/01/14 11:56 AM      Result Value Ref Range   Glucose, Bld 454 (*) 70 - 99 mg/dL  GLUCOSE, CAPILLARY     Status: Abnormal   Collection Time    03/01/14  1:24 PM      Result Value Ref Range   Glucose-Capillary 377 (*) 70 - 99 mg/dL  GLUCOSE, CAPILLARY     Status: Abnormal   Collection Time    03/01/14  4:21 PM      Result Value Ref Range   Glucose-Capillary 277 (*) 70 - 99 mg/dL   Comment 1 Notify RN    GLUCOSE, CAPILLARY     Status: Abnormal   Collection Time    03/01/14  9:03 PM      Result Value Ref Range   Glucose-Capillary 155 (*) 70 - 99 mg/dL  GLUCOSE, CAPILLARY     Status: Abnormal   Collection Time    03/02/14  2:53 AM      Result Value Ref Range   Glucose-Capillary 411 (*) 70 - 99 mg/dL   Comment 1 Notify RN    GLUCOSE, CAPILLARY     Status: Abnormal   Collection Time    03/02/14  6:49 AM      Result Value Ref Range   Glucose-Capillary 385 (*) 70 - 99 mg/dL  GLUCOSE, CAPILLARY     Status: Abnormal   Collection Time    03/02/14 11:31 AM      Result Value Ref Range   Glucose-Capillary 158 (*) 70 - 99 mg/dL  GLUCOSE, CAPILLARY     Status: None   Collection Time    03/02/14  4:21 PM      Result Value Ref Range   Glucose-Capillary 89  70 - 99 mg/dL  GLUCOSE, CAPILLARY     Status:  Abnormal   Collection Time    03/02/14  9:18 PM      Result Value Ref Range   Glucose-Capillary 165  (*) 70 - 99 mg/dL  GLUCOSE, CAPILLARY     Status: Abnormal   Collection Time    03/03/14  2:54 AM      Result Value Ref Range   Glucose-Capillary 234 (*) 70 - 99 mg/dL  BASIC METABOLIC PANEL     Status: Abnormal   Collection Time    03/03/14  5:00 AM      Result Value Ref Range   Sodium 139  137 - 147 mEq/L   Potassium 4.4  3.7 - 5.3 mEq/L   Chloride 104  96 - 112 mEq/L   CO2 24  19 - 32 mEq/L   Glucose, Bld 227 (*) 70 - 99 mg/dL   BUN 37 (*) 6 - 23 mg/dL   Creatinine, Ser 0.83  0.50 - 1.35 mg/dL   Calcium 10.9 (*) 8.4 - 10.5 mg/dL   GFR calc non Af Amer >90  >90 mL/min   GFR calc Af Amer >90  >90 mL/min   Comment: (NOTE)     The eGFR has been calculated using the CKD EPI equation.     This calculation has not been validated in all clinical situations.     eGFR's persistently <90 mL/min signify possible Chronic Kidney     Disease.  CBC WITH DIFFERENTIAL     Status: Abnormal (Preliminary result)   Collection Time    03/03/14  5:00 AM      Result Value Ref Range   WBC 10.1  4.0 - 10.5 K/uL   RBC 3.31 (*) 4.22 - 5.81 MIL/uL   Hemoglobin 8.2 (*) 13.0 - 17.0 g/dL   Comment: REPEATED TO VERIFY   HCT 27.4 (*) 39.0 - 52.0 %   MCV 82.8  78.0 - 100.0 fL   MCH 24.8 (*) 26.0 - 34.0 pg   MCHC 29.9 (*) 30.0 - 36.0 g/dL   RDW 16.2 (*) 11.5 - 15.5 %   Platelets 605 (*) 150 - 400 K/uL   Comment: REPEATED TO VERIFY   Neutrophils Relative % PENDING  43 - 77 %   Neutro Abs PENDING  1.7 - 7.7 K/uL   Band Neutrophils PENDING  0 - 10 %   Lymphocytes Relative PENDING  12 - 46 %   Lymphs Abs PENDING  0.7 - 4.0 K/uL   Monocytes Relative PENDING  3 - 12 %   Monocytes Absolute PENDING  0.1 - 1.0 K/uL   Eosinophils Relative PENDING  0 - 5 %   Eosinophils Absolute PENDING  0.0 - 0.7 K/uL   Basophils Relative PENDING  0 - 1 %   Basophils Absolute PENDING  0.0 - 0.1 K/uL   WBC Morphology PENDING     RBC Morphology PENDING     Smear Review PENDING     nRBC PENDING  0 /100 WBC   Metamyelocytes  Relative PENDING     Myelocytes PENDING     Promyelocytes Absolute PENDING     Blasts PENDING    GLUCOSE, CAPILLARY     Status: Abnormal   Collection Time    03/03/14  7:11 AM      Result Value Ref Range   Glucose-Capillary 215 (*) 70 - 99 mg/dL     HEENT: normal Cardio: RRR and no murmur Resp: CTA B/L and unlabored GI: BS positive and mildly distended and tympanitic, mild tenderness Extremity:  No Edema Skin:  . Neuro: Alert/Oriented, Abnormal Sensory absent in feet and Abnormal Motor 0/5 in LE,0/5 finger extension and 0/5 hand intrinsic. Wrist extension 2-, 3/5 tricep   2, biceps 5/5. Decreased sensation below elbows bilaterally but able to ID LT in Left C6-7 dermatomes. ?trace HF on right. Musc/Skel:  Generalized upper thoracic spine tenderness.  Gen NAD   Assessment/Plan: 1. 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 Physiatrist is providing close team supervision and 24 hour management of active medical problems listed below. Physiatrist and rehab team continue to assess barriers to discharge/monitor patient progress toward functional and medical goals.   FIM: FIM - Bathing Bathing Steps Patient Completed: Chest;Right Arm;Left Arm;Abdomen;Front perineal area Bathing: 3: Mod-Patient completes 5-7 86f10 parts or 50-74% (per patient and patient's mother's report)  FIM - Upper Body Dressing/Undressing Upper body dressing/undressing steps patient completed: Thread/unthread right sleeve of pullover shirt/dresss;Thread/unthread left sleeve of pullover shirt/dress;Put head through opening of pull over shirt/dress Upper body dressing/undressing: 4: Min-Patient completed 75 plus % of tasks FIM - Lower Body Dressing/Undressing Lower body dressing/undressing steps patient completed: Thread/unthread right pants leg;Thread/unthread left pants leg Lower body dressing/undressing: 2:  Max-Patient completed 25-49% of tasks (Mod assist with donning pants only (LTG))  FIM - Toileting Toileting: 1: Total-Patient completed zero steps, helper did all 3  FIM - TAir cabin crewTransfers: 0-Activity did not occur  FIM - BControl and instrumentation engineerDevices: Arm rests;HOB elevated;Bed rails;Sliding board Bed/Chair Transfer: 2: Bed > Chair or W/C: Max A (lift and lower assist);3: Supine > Sit: Mod A (lifting assist/Pt. 50-74%/lift 2 legs;2: Sit > Supine: Max A (lifting assist/Pt. 25-49%);2: Chair or W/C > Bed: Max A (lift and lower assist);1: Two helpers (sliding board used swith Mom assiting)  FIM - Locomotion: Wheelchair Distance: 25 Locomotion: Wheelchair: 0: Activity did not occur FIM - Locomotion: Ambulation Locomotion: Ambulation: 0: Activity did not occur  Comprehension Comprehension Mode: Auditory Comprehension: 5-Understands complex 90% of the time/Cues < 10% of the time  Expression Expression Mode: Verbal Expression: 5-Expresses complex 90% of the time/cues < 10% of the time  Social Interaction Social Interaction: 5-Interacts appropriately 90% of the time - Needs monitoring or encouragement for participation or interaction.  Problem Solving Problem Solving: 5-Solves basic problems: With no assist  Memory Memory: 6-More than reasonable amt of time  Medical Problem List and Plan:  1. 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 DVT Prophylaxis/Anticoagulation: Pharmaceutical: Lovenox  3. Chronic Pain Management: increase MS contin to 45 mg bid. Air bed for back and buttock pain as well as pressure relief measures.      -reminded him to use heating pad for shoulder/neck symptoms, good posture when in bed and chair  -continues robaxin. TPI x5 performed yesterday with good results to left periscapular area and traps  -his clavicle fracture also in this general area  with distraction laterally---denies any pain in this area.  -lyrica 724mbid for neuropathic pain---increase to TID 4. Mood: reviewed anxiety with patient. Discussed triggers and ways to de-escalate. Also have added prn xanax if he is unable to manage it. 5. Neuropsych: This patient is capable of making decisions on his own behalf.  6. Spinal cord infarct: steroids off-   7. DM type 2 with gastroparesis: increase lantus to 30u qhs . increased novolog to 8u units tid ac yesterday  -hold  on sliding scale at present due to volatile readings.    -appreciate Dr. Cordelia Pen one-time consultation 8. ChronicBLE wounds with chronically elevated ESR: continue doxycline for 30 days (started 02/02/14). Follow up with ID after discharge. Continue local wound care.   -right elbow granulating in  -wet-dry with allevyn over right popliteal area 9. Constipation/diabetic gastroparesis/neurogenic bowel: reglan causing cramping--dc . Bowel program - D/C miralx, cont colace and bisacodyl supp 10. Severe diabetic peripheral neuropathy:resume neurontin 153m bid an 4036m qhs   11. Pulmonary:  deep breathing, IS/FV, CPAP -quad cough, assistive techniques  -keep hob at 30 degrees  -chest PT 12. Low grade temp:   -labs ok ,cxr ok  -IS, observation 13. CV---increase midodrine to 26m70m LOS (Days) 26 A FACE TO FACE EVALUATION WAS PERFORMED  ZacMeredith Staggers27/2015, 8:38 AM

## 2014-03-03 NOTE — Progress Notes (Signed)
Dulcolax suppository administered at 2100 last night; patient continued to have incontinent, soft bowel movements throughout the night. RN came in the morning to clean patient up and started to perform digital stimulation but patient and mother refused, informed patient that this will help clean out his bowels to prevent further accidents later on the day, but patient and mother both stated No. Patient was cleaned up as well as possible and brief was replaced.

## 2014-03-03 NOTE — Progress Notes (Signed)
Occupational Therapy Session Notes  Patient Details  Name: Jason [name] MRN: [id number] Date of Birth: October 30, 1985  Today's Date: 03/03/2014 Time: [phone] and 230-315 Time Calculation (min): 60 min and 45 min  Short Term Goals: Week 3:  OT Short Term Goal 1 (Week 3): STG=LTGs  Skilled Therapeutic Interventions/Progress Updates:  1)  Patient resting in bed upon arrival with bath and dress already completed and leg loops on.  Engaged in [place] lift transfers to tub bench with cut out to be used for [facility].  Focused session on patient independently guiding his mom and discussions regarding safety related to possible drop in BP at any point during this task.  Both patient and mom verbalized understanding regarding using the [facility] and not the sliding board.  As per discussion with patient and his mother yesterday, she is not yet cleared to safely transfer patient with sliding board per physical therapy.  Discussed need for nursing to be present if patient and mom decide to use [facility] for bowel program while in hospital secondary to today was a "dry run" since patient was fully clothed, no portion of the bowel program was performed except the transfer and the bowel program regularly occurs in the evening (different time of day).   2)  Patient resting in w/c upon arrival with mother and w/c vendor present.  Patient's loaner w/c had just arrived therefore performed slide board transfer manual w/c>power w/c.  Assisted patient and vendor to make decisions related to adjustments for fit and after vendor taught patient basics for operation, patient practiced using power tilt in space and driving power chair in the unit.  [name] practiced manually moving power chair and discovered that there is only one fast speed for the manual joystick.  Vendor was alerted that there was only one safety belt strap and he would have to bring in the other strap another day.  Vendor completing additional programming upon  completion of session.  Therapy Documentation Precautions:  Precautions Precautions: Fall Precaution Comments: sacral breakdown-boost when in w/c, abdominal binder and TEDs due to low BP. Required Braces or Orthoses: Other Brace/Splint Other Brace/Splint: Bilateral Prevalon boots at night Restrictions Weight Bearing Restrictions: No Pain: Denies pain in either session ADL: See FIM for current functional status  Therapy/Group: Individual Therapy for both sessions  Gaye Pollack 03/03/2014, 4:45 PM

## 2014-03-04 ENCOUNTER — Inpatient Hospital Stay (HOSPITAL_COMMUNITY): Payer: Medicaid Other | Admitting: Physical Therapy

## 2014-03-04 ENCOUNTER — Encounter (HOSPITAL_COMMUNITY): Payer: Self-pay | Admitting: Occupational Therapy

## 2014-03-04 LAB — URINALYSIS, ROUTINE W REFLEX MICROSCOPIC
Bilirubin Urine: NEGATIVE
GLUCOSE, UA: 250 mg/dL — AB
KETONES UR: NEGATIVE mg/dL
Nitrite: NEGATIVE
PH: 5 (ref 5.0–8.0)
Protein, ur: 30 mg/dL — AB
SPECIFIC GRAVITY, URINE: 1.021 (ref 1.005–1.030)
Urobilinogen, UA: 0.2 mg/dL (ref 0.0–1.0)

## 2014-03-04 LAB — URINE MICROSCOPIC-ADD ON

## 2014-03-04 LAB — GLUCOSE, CAPILLARY
GLUCOSE-CAPILLARY: 42 mg/dL — AB (ref 70–99)
GLUCOSE-CAPILLARY: 417 mg/dL — AB (ref 70–99)
GLUCOSE-CAPILLARY: 474 mg/dL — AB (ref 70–99)
GLUCOSE-CAPILLARY: 467 mg/dL — AB (ref 70–99)
Glucose-Capillary: 222 mg/dL — ABNORMAL HIGH (ref 70–99)
Glucose-Capillary: 162 mg/dL — ABNORMAL HIGH (ref 70–99)
Glucose-Capillary: 47 mg/dL — ABNORMAL LOW (ref 70–99)
Glucose-Capillary: 91 mg/dL (ref 70–99)
Glucose-Capillary: 520 mg/dL — ABNORMAL HIGH (ref 70–99)
Glucose-Capillary: 433 mg/dL — ABNORMAL HIGH (ref 70–99)

## 2014-03-04 LAB — CLOSTRIDIUM DIFFICILE BY PCR: Toxigenic C. Difficile by PCR: NEGATIVE

## 2014-03-04 LAB — GLUCOSE, RANDOM: Glucose, Bld: 495 mg/dL — ABNORMAL HIGH (ref 70–99)

## 2014-03-04 MED ORDER — GLUCOSE 40 % PO GEL: 1.0000 | ORAL | Status: DC | PRN

## 2014-03-04 MED ORDER — INSULIN GLARGINE 100 UNIT/ML ~~LOC~~ SOLN
34.0000 [IU] | Freq: Every day | SUBCUTANEOUS | Status: DC
  Administered 2014-03-04: 34 [IU] via SUBCUTANEOUS
  Filled 2014-03-04 (×2): qty 0.34

## 2014-03-04 MED ORDER — LIDOCAINE HCL 2 % EX GEL
Freq: Three times a day (TID) | CUTANEOUS | Status: DC
  Administered 2014-03-04: 20 via TOPICAL
  Filled 2014-03-04 (×34): qty 5

## 2014-03-04 MED ORDER — PREGABALIN 50 MG PO CAPS
75.0000 mg | ORAL_CAPSULE | Freq: Three times a day (TID) | ORAL | Status: DC
  Administered 2014-03-04 – 2014-03-05 (×4): 75 mg via ORAL
  Filled 2014-03-04 (×8): qty 1

## 2014-03-04 MED ORDER — INSULIN ASPART 100 UNIT/ML ~~LOC~~ SOLN
4.0000 [IU] | Freq: Once | SUBCUTANEOUS | Status: AC
  Administered 2014-03-04: 4 [IU] via SUBCUTANEOUS

## 2014-03-04 MED ORDER — INSULIN ASPART 100 UNIT/ML ~~LOC~~ SOLN
2.0000 [IU] | Freq: Once | SUBCUTANEOUS | Status: AC
  Administered 2014-03-04: 2 [IU] via SUBCUTANEOUS

## 2014-03-04 MED ORDER — GLUCOSE 40 % PO GEL
ORAL | Status: AC
  Administered 2014-03-04: 37.5 g
  Filled 2014-03-04: qty 1

## 2014-03-04 NOTE — Patient Care Conference (Signed)
Inpatient RehabilitationTeam Conference and Plan of Care Update Date: 03/02/2014   Time: 2:00 PM    Patient Name: Jason [name]      Medical Record Number: [id number]  Date of Birth: [birth year] Sex: Male         Room/Bed: 4W18C/4W18C-01 Payor Info: Payor: MEDICAID PENDING / Plan: MEDICAID PENDING / Product Type: *No Product type* /    Admitting Diagnosis: SPINAL CORD  Admit Date/Time:  02/05/2014  6:37 PM Admission Comments: No comment available   Primary Diagnosis:  <principal problem not specified> Principal Problem: <principal problem not specified>  Patient Active Problem List   Diagnosis Date Noted  . 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 Diabetic foot ulcer 02/04/2014  . Oral thrush 02/04/2014  . Leg weakness 01/30/2014  . Bilateral lower leg cellulitis 01/30/2014  . Altered mental status 01/30/2014  . 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 HCAP (healthcare-associated pneumonia) 01/29/2014  . DKA (diabetic ketoacidoses) 12/24/2013  . DM type 1 causing leg or foot ulcer 10/04/2013  . Rhinovirus infection 10/03/2013  . Protein-calorie malnutrition, severe 09/24/2013  . Aspiration pneumonia 09/23/2013  . Gastroparesis due to DM 06/02/2013  . Chronic low back pain 04/18/2013  . Painful diabetic neuropathy 03/20/2013  . Traumatic ecchymosis of toe 01/29/2013  . Tobacco abuse 01/29/2013  . Noncompliance with diet and medication regimen 01/29/2013  . VIRAL URI 09/15/2007  . GERD 09/15/2007  . Type 1 diabetes, uncontrolled, with neuropathy 05/29/2007  . ASTHMA 05/29/2007    Expected Discharge Date: Expected Discharge Date: 03/05/14  Team Members Present: Physician leading conference: Dr. Alger Simons Social Worker Present: Lennart Pall, LCSW Nurse Present: Elliot Cousin, RN PT Present: Canary Brim, PT OT Present: Blanchard Mane, OT;Jennifer Tamala Julian, OT;Patricia Lissa Hoard, OT SLP Present: Weston Anna, SLP PPS Coordinator present : Daiva Nakayama, RN, CRRN     Current  Status/Progress Goal Weekly Team Focus  Medical   STILL INCONSISTENT, BP ISSUES. BOWEL PROGRAM NOT WHERE IT NEEDS TO BE  IMPROVE TRANSFERS FOR FUNCTIONAL ACTIVITIES  PAIN MGT, BOWEL PROGRAM   Bowel/Bladder   incont. of bowel and bladder- foley inpalce; pt havinf continous small soft bowel movements throughout day. bowel program inplace  Stay free of UTI, Continue bowel program. manage bowel program  educate on bowel programs and contiune medications   Swallow/Nutrition/ Hydration             ADL's   Limited by: pain (shoulder & back), bowel program not working-missing therapy or not sleeping well due to bowel issues so very tired in therapy.  Supervision/set up: self feeding, Min/supervision: UB bath & dress, Min assist: static sitting balance, Mod-max assist: dynamic sitting balance (short, long, and circle sitting)  Supervision upper body dressing, Mod A bathing & LB dressing (pants only), Mod A sitting balance.  ADLs, bed mobility, BLE management using leg loops, dynamic sitting balance, directing his own care, self-feeding and grooming, activity tolerance, toilet transfers.   Mobility   min A  min A  family ed, d/c planning   Communication             Safety/Cognition/ Behavioral Observations            Pain   scheduled, morphine 30mg  and PRN oxy 5-10mg    pain 3 or less  assess pain and medicate as needed   Skin   diabetic ulcers to BLE and BL great toes; foam to BLE with santyl to great toes with guaze inplace; healing stage II to sacrum; elbow wound WOC nurse is  following  Stay free of infection and further skin break down.  assess skin qshift change dressings as ordered      *See Care Plan and progress notes for long and short-term goals.  Barriers to Discharge: SEVERITY OF DEFICITS, INCONSISTENT COMPLIANCE, ORTHOSTASIS    Possible Resolutions to Barriers:  EDUCATION, MIDODRINE FOR BP    Discharge Planning/Teaching Needs:  home with mother to provide 24/7 assistance       Team Discussion:  bowel program still an issue.  This is affecting sleep and interfering with therapy.  To make med adjustments.  Training mother on hoyer use for home as mother is not safe to use transfer board.  Still with episodes of BP drops but improved overall.  Power w/c for delivery today.    Revisions to Treatment Plan:  None   Continued Need for Acute Rehabilitation Level of Care: The patient requires daily medical management by a physician with specialized training in physical medicine and rehabilitation for the following conditions: Daily direction of a multidisciplinary physical rehabilitation program to ensure safe treatment while eliciting the highest outcome that is of practical value to the patient.: Yes Daily medical management of patient stability for increased activity during participation in an intensive rehabilitation regime.: Yes Daily analysis of laboratory values and/or radiology reports with any subsequent need for medication adjustment of medical intervention for : Post surgical problems;Pulmonary problems;Neurological problems;Cardiac problems  Lennart Pall 03/04/2014, 8:50 AM

## 2014-03-04 NOTE — Progress Notes (Signed)
Nursing Note: Pt's cbg is 433 after 4 units of novolog ordered and his scheduled lantus was given earlier.A: Paged on-call Pam Love,PA and made her aware.To check pt's cbg in an hour and qhour till it is down.Awaiting result of stat lab for glucose check.It was drawn but the result is not back yet and Pam was made aware of that. wbb

## 2014-03-04 NOTE — Progress Notes (Signed)
Bowel program done with suppository @ 2100 followed by dig-stim @ 2130. Pt had an incontinent large, soft BM at approximately 2210. Patient stated he felt better and rested well throughout the night. Nurse tech reported another large incontinent  BM this morning at 0530. Will continue to monitor.[name]

## 2014-03-04 NOTE — Progress Notes (Addendum)
Social Work  Discharge Note  The overall goal for the admission was met for:   Discharge location: Yes - home with parents to provide 24/7 assistance  Length of Stay: Yes - 28 days (with d/c 5/29)  Discharge activity level: Yes  - min/ mod assist w/c level  Home/community participation: Yes  Services provided included: MD, RD, PT, OT, RN, TR, Pharmacy, Neuropsych and SW  Financial Services: Medicaid  Follow-up services arranged: Home Health: RN via [place], DME: hospital bed with gel overal, hoyer lift and padded tub bench with cut-out seat via [place], Other: referrals also to [facility] and for [facility] Aide services and Patient/Family has no preference for HH/DME agencies  Comments (or additional information):  Of note, with pt's current diagnosis, he does not qualify for therapy services under Medicaid.  While HHPT and OT were recommended, none were arranged as family cannot afford to privately pay for these services.  Patient/Family verbalized understanding of follow-up arrangements: Yes  Individual responsible for coordination of the follow-up plan: patient  Confirmed correct DME delivered: Lennart Pall 03/04/2014    Lennart Pall

## 2014-03-04 NOTE — Progress Notes (Signed)
Occupational Therapy Session Note  Patient Details  Name: Jason [name] MRN: [id number] Date of Birth: 01-Jun-1986  Today's Date: 03/04/2014 Time: 1000-1100 Time Calculation (min): 60 min  Skilled Therapeutic Interventions/Progress Updates:    Pt seen for individual OT treatment session this morning for ADL retraining session related to dressing and transfers using hoyer lift to w/c. Pt and his mother demonstrate necessary knowledge related to ADL's, self care and hoyer lifts for anticipated d/c home tomorrow as planned. Pt/mother were educated in home program for fine motor exercises and soft/yellow putty related to basic grip/pinch and opposition tasks (handouts were provided and reviewed and pt/mother were educated to only perform 1-2 times per day 5-10 reps and increase to performing additional exercises on the list as coordination improves or he is able), both pt/mother, verbalized understanding of this as well as for discharge of exercises should he experience increased pain.   Therapy Documentation Precautions:  Precautions Precautions: Fall Precaution Comments: sacral breakdown-boost when in w/c, abdominal binder and TEDs due to low BP. Required Braces or Orthoses: Other Brace/Splint Other Brace/Splint: Bilateral Prevalon boots at night Restrictions Weight Bearing Restrictions: No General:   Vital Signs:   Pain:  Pt denied pain during this therapy session. ADL: ADL ADL Comments: refer to FIM Exercises:    See note above.  See FIM for current functional status  Therapy/Group: Individual Therapy  [name] [name] 03/04/2014, 12:38 PM

## 2014-03-04 NOTE — Progress Notes (Signed)
Physical Therapy Discharge Summary  Patient Details  Name: Jason [name] MRN: [id number] Date of Birth: 03/07/1986  Today's Date: 03/04/2014  Patient has met 4 of 4 long term goals due to improved activity tolerance, improved balance, improved postural control, increased strength, increased range of motion, decreased pain, ability to compensate for deficits, functional use of  right upper extremity and left upper extremity and improved coordination.  Patient to discharge at a wheelchair level Min Assist for bed mobility with leg loops and use of hospital bed, hoyer lift transfers bed <> chair.   Patient's care partner is independent to provide the necessary physical assistance at discharge.  Reasons goals not met: n/a  Recommendation:  Patient will benefit from ongoing skilled PT services in home health setting to continue to advance safe functional mobility, address ongoing impairments in strength, balance, mobility, functional independence, and minimize fall risk.  Equipment: power w/c, hospital bed, hoyer lift  Reasons for discharge: treatment goals met and discharge from hospital  Patient/family agrees with progress made and goals achieved: Yes  PT Discharge Precautions/Restrictions Precautions Precautions: Fall Precaution Comments: sacral breakdown-boost when in w/c, abdominal binder and TEDs due to low BP. Required Braces or Orthoses: Other Brace/Splint (Abdominal binder applied in supine) Other Brace/Splint: Bilateral Prevalon boots at night Restrictions Weight Bearing Restrictions: No  Cognition Overall Cognitive Status: Within Functional Limits for tasks assessed  Sensation Sensation Light Touch: Impaired Detail Light Touch Impaired Details: Absent LLE;Impaired RLE Proprioception: Impaired by gross assessment Coordination Gross Motor Movements are Fluid and Coordinated: No Fine Motor Movements are Fluid and Coordinated: No Coordination and Movement Description:  Pt with noted improvement in fingers/gross grasp for use with ADL's, Positioning and self feeding. He has a home program for putty ex's and fine motor/coordination ex's. No LE movement, able to use leg loops to assist with position changes and bed mobility. Motor  Motor Motor: Paraplegia Motor - Skilled Clinical Observations: pt can elevate shoulders and lift arms. Motor - Discharge Observations: Improved use of bilateral hands for self feeding, uses leg loops to assist with position changes and bed mobility.    Trunk/Postural Assessment  Cervical Assessment Cervical Assessment: Within Functional Limits Thoracic Assessment Thoracic Assessment:  (decreased ROM due to weakness) Lumbar Assessment Lumbar Assessment:  (pt with trunk weakness) Postural Control Postural Control:  (balance improved but still impaired)  Balance Static Sitting Balance Static Sitting - Level of Assistance: 4: Min assist;5: Stand by assistance Extremity Assessment  RUE Assessment RUE Assessment: Exceptions to WFL RUE Strength RUE Overall Strength Comments: Grossly 4-/5 in shoulder flex, elbow flex, tricep extension. 3/5 wrist. Pt with Minimal finger extension. Able to use pincer grasp to assist w/ self feeding and grooming activities.. LUE Assessment LUE Assessment: Exceptions to WFL LUE Strength LUE Overall Strength Comments: 3+/5 shoulder flex, elbow flex and triceps ext. 3/5 wrist extension, limited finger extension, slight pincer grasp for assist w/ self feeding and ADL/grooming tasks. RLE Assessment RLE Assessment:  (PROM WFL, 0/5 strength) LLE Assessment LLE Assessment:  (PROM WFL, 0/5 strength)  See FIM for current functional status   K  03/04/2014, 2:04 PM   

## 2014-03-04 NOTE — Progress Notes (Addendum)
Occupational Therapy Discharge Summary  Patient Details  Name: Jason [name] MRN: [id number] Date of Birth: 04/01/1986  Today's Date: 03/04/2014 Time: 1000-1100 Time Calculation (min): 60 min  Patient has met 7 of 7 long term goals due to improved activity tolerance, postural control, ability to compensate for deficits, functional use of  RIGHT upper and LEFT upper extremity and improved coordination.  Patient to discharge at overall Total Assist/hoyer lift for transfers, Min-Mod A bathing/dressing and supervision/set-up grooming & eating.  Patient's care partner is independent to provide the necessary physical assistance at discharge using hoyer lift for transfers & necessary DME.    Reasons goals not met: N/A  Recommendation:  Patient will benefit from ongoing skilled OT services in home health setting to continue to advance safe functional mobility, address ongoing impairments in strength, balance, mobility, functional independence, and minimize fall risk & to continue to advance functional skills in the areas of BADL, iADL and Reduce care partner burden.  Equipment: Universal cuff for self feeding, putty and home program for UE/grip and fine motor ex's. Pt/family has scheduled for DME delivery (bed, hoyer lift etc).  Reasons for discharge: treatment goals met  Patient/family agrees with progress made and goals achieved: Yes  OT Discharge Precautions/Restrictions  Precautions Precautions: Fall Precaution Comments: sacral breakdown-boost when in w/c, abdominal binder and TEDs due to low BP. Required Braces or Orthoses: Other Brace/Splint (Abdominal binder applied in supine) Other Brace/Splint: Bilateral Prevalon boots at night General   Vital Signs Therapy Vitals Temp: 97.6 F (36.4 C) Temp src: Oral Pulse Rate: 89 Resp: 18 BP: 159/102 mmHg Patient Position (if appropriate): Lying Oxygen Therapy SpO2: 100 % Pain  Pt denies pain during OT treatment session  today. ADL ADL ADL Comments: refer to FIM Vision/Perception     Cognition Overall Cognitive Status: Within Functional Limits for tasks assessed Orientation Level: Oriented X4 Memory: Appears intact Awareness: Appears intact Problem Solving: Appears intact Safety/Judgment: Appears intact Sensation Coordination Gross Motor Movements are Fluid and Coordinated: No Fine Motor Movements are Fluid and Coordinated: No Coordination and Movement Description: Pt with noted improvement in fingers/gross grasp for use with ADL's, Positioning and self feeding. He has a home program for putty ex's and fine motor/coordination ex's. No LE movement, able to use leg loops to assist with position changes and bed mobility. Motor  Motor Motor - Skilled Clinical Observations: pt can elevate shoulders and lift arms. Motor - Discharge Observations: Improved use of bilateral hands for self feeding, uses leg loops to assist with position changes and bed mobility.  Mobility     Trunk/Postural Assessment  Cervical Assessment Cervical Assessment: Within Functional Limits  Balance   Extremity/Trunk Assessment RUE Assessment RUE Assessment: Exceptions to [facility] RUE Strength RUE Overall Strength Comments: Grossly 4-/5 in shoulder flex, elbow flex, tricep extension. 3/5 wrist. Pt with Minimal finger extension. Able to use pincer grasp to assist w/ self feeding and grooming activities.. LUE Assessment LUE Assessment: Exceptions to [facility] LUE Strength LUE Overall Strength Comments: 3+/5 shoulder flex, elbow flex and triceps ext. 3/5 wrist extension, limited finger extension, slight pincer grasp for assist w/ self feeding and ADL/grooming tasks.  See FIM for current functional status  Jason [name] Jason [name] 03/04/2014, 1:21 PM

## 2014-03-04 NOTE — Progress Notes (Signed)
Social Work Patient ID: Jason [name], male   DOB: 03-Dec-1985, 28 y.o.   MRN: [id number]  Have reviewed team conference with pt and mother and discussion of d/c planning issues.  Both feel they are ready for d/c tomorrow.  Pt with questions about his DME - all delivered out to home yesterday.  Again, discussed the fact that, for his diagnosis, Medicaid will not cover therapy f/u.  I have made referral for [facility].  Mother reports she has received education from therapists on exercises that can be performed at home.  Continue to follow.  Lennart Pall, LCSW

## 2014-03-04 NOTE — Progress Notes (Signed)
At approximately noon patient with complaints of 10/10 pain in penis area with the feeling of needing to void. Patient assisted to bed via hoyer lift. Bladder scan noted 0 volume in bladder. Patient also with a large amount of incontinent loose brown stool. Notified Pam Love,PA given order to change foley, collect UA C&S, and C-diff sample. After patient had large stool stated pain completely subsided. Also noted patient's abdomen to be distended, no tenderness noted, and soft to the touch. Made Pam Love, PA no new orders given at this time. Will continue to monitor patient.

## 2014-03-04 NOTE — Progress Notes (Signed)
Recreational Therapy Discharge Summary Patient Details  Name: MIGUELANGEL KORN MRN: [id number] Date of Birth: 1986/04/20 Today's Date: 03/04/2014  Long term goals set: 1  Long term goals met: 1  Comments on progress toward goals: Pt has made great progress toward goal and is ready for discharge home with family to provide 24 hour supervision/assist.  Pt is supervision/set up level for simple TR tasks when seated in w/c and is excited to return home & experiment with his abilities.  Pts family has been present throughout LOS and involved in care.Reasons for discharge: discharge from hospital Patient/family agrees with progress made and goals achieved: Yes  Waldon Reining 03/04/2014, 3:26 PM

## 2014-03-04 NOTE — Progress Notes (Signed)
Physical Therapy Note  Patient Details  Name: ERVIE MCCARD MRN: [id number] Date of Birth: [birth year] Today's Date: 03/04/2014  Time: 1000-1100 60 minutes  1:1 No c/o pain.  Pt in power w/c.  Pt mod I with power w/c, able to demo tiliting for pressure relief, manuevering in home and controlled environments.  sliding board transfers w/c <> mat with min A, pt able to self direct set up.  Seated balance edge of mat for reaching and tossing task with close supervision/min A for sitting balance.  Pt with difficulty tolerating sitting edge of mat due to feeling "dizzy" and c/o vision changes.  Pt requires 3 x lying rest breaks during 30 minutes seated edge of mat, RN made aware, BP 120/80.   Time: 7673-4193 45 minutes  1:1 Pt with c/o abdominal pain, RN aware, meds given during session.  Treatment focused on bed mobility, rolling and positioning in w/c.  Pt able to perform with min A.  Pt educated on energy conservation, importance of advocating for himself and his health at home, pt verbalizes understanding.  Danae Orleans Corleone Biegler 03/04/2014, 11:00 AM

## 2014-03-04 NOTE — Significant Event (Signed)
Hypoglycemic Event  CBG: 42  Treatment: 15 GM carbohydrate snack  Symptoms: None  Follow-up CBG: Time:1133 CBG Result:91  Possible Reasons for Event: Unknown  Comments/MD notified:Notified Pam Love, PA given order to hold Meal coverage insulin    Jason [name]  Remember to initiate Hypoglycemia Order Set & complete

## 2014-03-04 NOTE — Progress Notes (Signed)
Subjective/Complaints: Having abdominal spasms, leg pain, pain with dig stim. A 12 point review of systems has been performed and if not noted above is otherwise negative.   Objective: Vital Signs: Blood pressure 106/71, pulse 86, temperature 98.2 F (36.8 C), temperature source Oral, resp. rate 18, weight 58.4 kg (128 lb 12 oz), SpO2 100.00%. No results found. Results for orders placed during the hospital encounter of 02/05/14 (from the past 72 hour(s))  GLUCOSE, CAPILLARY     Status: Abnormal   Collection Time    03/01/14 11:19 AM      Result Value Ref Range   Glucose-Capillary 549 (*) 70 - 99 mg/dL  GLUCOSE, RANDOM     Status: Abnormal   Collection Time    03/01/14 11:56 AM      Result Value Ref Range   Glucose, Bld 454 (*) 70 - 99 mg/dL  GLUCOSE, CAPILLARY     Status: Abnormal   Collection Time    03/01/14  1:24 PM      Result Value Ref Range   Glucose-Capillary 377 (*) 70 - 99 mg/dL  GLUCOSE, CAPILLARY     Status: Abnormal   Collection Time    03/01/14  4:21 PM      Result Value Ref Range   Glucose-Capillary 277 (*) 70 - 99 mg/dL   Comment 1 Notify RN    GLUCOSE, CAPILLARY     Status: Abnormal   Collection Time    03/01/14  9:03 PM      Result Value Ref Range   Glucose-Capillary 155 (*) 70 - 99 mg/dL  GLUCOSE, CAPILLARY     Status: Abnormal   Collection Time    03/02/14  2:53 AM      Result Value Ref Range   Glucose-Capillary 411 (*) 70 - 99 mg/dL   Comment 1 Notify RN    GLUCOSE, CAPILLARY     Status: Abnormal   Collection Time    03/02/14  6:49 AM      Result Value Ref Range   Glucose-Capillary 385 (*) 70 - 99 mg/dL  GLUCOSE, CAPILLARY     Status: Abnormal   Collection Time    03/02/14 11:31 AM      Result Value Ref Range   Glucose-Capillary 158 (*) 70 - 99 mg/dL  GLUCOSE, CAPILLARY     Status: None   Collection Time    03/02/14  4:21 PM      Result Value Ref Range   Glucose-Capillary 89  70 - 99 mg/dL  GLUCOSE, CAPILLARY     Status: Abnormal    Collection Time    03/02/14  9:18 PM      Result Value Ref Range   Glucose-Capillary 165 (*) 70 - 99 mg/dL  GLUCOSE, CAPILLARY     Status: Abnormal   Collection Time    03/03/14  2:54 AM      Result Value Ref Range   Glucose-Capillary 234 (*) 70 - 99 mg/dL  BASIC METABOLIC PANEL     Status: Abnormal   Collection Time    03/03/14  5:00 AM      Result Value Ref Range   Sodium 139  137 - 147 mEq/L   Potassium 4.4  3.7 - 5.3 mEq/L   Chloride 104  96 - 112 mEq/L   CO2 24  19 - 32 mEq/L   Glucose, Bld 227 (*) 70 - 99 mg/dL   BUN 37 (*) 6 - 23 mg/dL   Creatinine, Ser 0.83  0.50 - 1.35 mg/dL   Calcium 10.9 (*) 8.4 - 10.5 mg/dL   GFR calc non Af Amer >90  >90 mL/min   GFR calc Af Amer >90  >90 mL/min   Comment: (NOTE)     The eGFR has been calculated using the CKD EPI equation.     This calculation has not been validated in all clinical situations.     eGFR's persistently <90 mL/min signify possible Chronic Kidney     Disease.  CBC WITH DIFFERENTIAL     Status: Abnormal   Collection Time    03/03/14  5:00 AM      Result Value Ref Range   WBC 10.1  4.0 - 10.5 K/uL   RBC 3.31 (*) 4.22 - 5.81 MIL/uL   Hemoglobin 8.2 (*) 13.0 - 17.0 g/dL   Comment: REPEATED TO VERIFY   HCT 27.4 (*) 39.0 - 52.0 %   MCV 82.8  78.0 - 100.0 fL   MCH 24.8 (*) 26.0 - 34.0 pg   MCHC 29.9 (*) 30.0 - 36.0 g/dL   RDW 16.2 (*) 11.5 - 15.5 %   Platelets 605 (*) 150 - 400 K/uL   Comment: REPEATED TO VERIFY   Neutrophils Relative % 44  43 - 77 %   Lymphocytes Relative 37  12 - 46 %   Monocytes Relative 15 (*) 3 - 12 %   Eosinophils Relative 3  0 - 5 %   Basophils Relative 1  0 - 1 %   Neutro Abs 4.5  1.7 - 7.7 K/uL   Lymphs Abs 3.7  0.7 - 4.0 K/uL   Monocytes Absolute 1.5 (*) 0.1 - 1.0 K/uL   Eosinophils Absolute 0.3  0.0 - 0.7 K/uL   Basophils Absolute 0.1  0.0 - 0.1 K/uL   Smear Review PLATELETS APPEAR INCREASED    GLUCOSE, CAPILLARY     Status: Abnormal   Collection Time    03/03/14  7:11 AM       Result Value Ref Range   Glucose-Capillary 215 (*) 70 - 99 mg/dL  GLUCOSE, CAPILLARY     Status: Abnormal   Collection Time    03/03/14 11:33 AM      Result Value Ref Range   Glucose-Capillary 110 (*) 70 - 99 mg/dL  GLUCOSE, CAPILLARY     Status: None   Collection Time    03/03/14  4:56 PM      Result Value Ref Range   Glucose-Capillary 84  70 - 99 mg/dL  GLUCOSE, CAPILLARY     Status: Abnormal   Collection Time    03/03/14  8:29 PM      Result Value Ref Range   Glucose-Capillary 151 (*) 70 - 99 mg/dL  GLUCOSE, CAPILLARY     Status: Abnormal   Collection Time    03/04/14  3:08 AM      Result Value Ref Range   Glucose-Capillary 222 (*) 70 - 99 mg/dL  GLUCOSE, CAPILLARY     Status: Abnormal   Collection Time    03/04/14  7:12 AM      Result Value Ref Range   Glucose-Capillary 162 (*) 70 - 99 mg/dL     HEENT: normal Cardio: RRR and no murmur Resp: CTA B/L and unlabored GI: BS positive and mildly distended and tympanitic, mild tenderness Extremity:  No Edema Skin: legs improved. Elbow granulating nicely. Tiny stage 2 at sacrum Neuro: Alert/Oriented, Abnormal Sensory absent in feet and Abnormal Motor 0/5 in LE,0/5 finger extension  and 0/5 hand intrinsic. Wrist extension 2-, 3/5 tricep   2, biceps 5/5. Decreased sensation below elbows bilaterally but able to ID LT in Left C6-7 dermatomes. ?trace HF on right. Musc/Skel:  Generalized upper thoracic spine tenderness.  Gen NAD   Assessment/Plan: 1. 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 Physiatrist is providing close team supervision and 24 hour management of active medical problems listed below. Physiatrist and rehab team continue to assess barriers to discharge/monitor patient progress toward functional and medical goals.   FIM: FIM - Bathing Bathing Steps Patient Completed: Chest;Right Arm;Left Arm;Abdomen;Front  perineal area Bathing: 3: Mod-Patient completes 5-7 72f10 parts or 50-74% (per patient and patient's mother)  FIM - Upper Body Dressing/Undressing Upper body dressing/undressing steps patient completed: Thread/unthread right sleeve of pullover shirt/dresss;Thread/unthread left sleeve of pullover shirt/dress;Put head through opening of pull over shirt/dress;Pull shirt over trunk Upper body dressing/undressing: 5: Supervision: Safety issues/verbal cues FIM - Lower Body Dressing/Undressing Lower body dressing/undressing steps patient completed: Thread/unthread right pants leg;Thread/unthread left pants leg Lower body dressing/undressing: 3: Mod-Patient completed 50-74% of tasks (per patient and patient's mother)  FIM - Toileting Toileting: 1: Total-Patient completed zero steps, helper did all 3  FIM - TRadio producerDevices: Bedside commode (padded tub bench with cut out) Toilet Transfers: 1-Mechanical lift (Optician, dispensing  FIM - BControl and instrumentation engineerDevices: Arm rests;HOB elevated;Bed rails;Sliding board Bed/Chair Transfer: 2: Bed > Chair or W/C: Max A (lift and lower assist);3: Supine > Sit: Mod A (lifting assist/Pt. 50-74%/lift 2 legs;2: Sit > Supine: Max A (lifting assist/Pt. 25-49%);2: Chair or W/C > Bed: Max A (lift and lower assist);1: Two helpers (sliding board used swith Mom assiting)  FIM - Locomotion: Wheelchair Distance: 25 Locomotion: Wheelchair: 0: Activity did not occur FIM - Locomotion: Ambulation Locomotion: Ambulation: 0: Activity did not occur  Comprehension Comprehension Mode: Auditory Comprehension: 6-Follows complex conversation/direction: With extra time/assistive device  Expression Expression Mode: Verbal Expression: 6-Expresses complex ideas: With extra time/assistive device  Social Interaction Social Interaction: 6-Interacts appropriately with others with medication or extra time (anti-anxiety,  antidepressant).  Problem Solving Problem Solving: 6-Solves complex problems: With extra time  Memory Memory: 6-More than reasonable amt of time  Medical Problem List and Plan:  1. 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 DVT Prophylaxis/Anticoagulation: Pharmaceutical: Lovenox  3. Chronic Pain Management: increase MS contin to 45 mg bid. Air bed for back and buttock pain as well as pressure relief measures.      -reminded him to use heating pad for shoulder/neck symptoms, good posture when in bed and chair  -continues robaxin for muscle spasms.   -his clavicle fracture also in this general area with distraction laterally---denies any pain in this area.  -lyrica 789mbid for neuropathic pain 4. Mood: reviewed anxiety with patient. Discussed triggers and ways to de-escalate. Also have added prn xanax if he is unable to manage it. 5. Neuropsych: This patient is capable of making decisions on his own behalf.  6. Spinal cord infarct: steroids off-   7. DM type 2 with gastroparesis: increased lantus to 25 units qhs (gradually increase as needed) . increase novolog to 8u units tid ac  -hold on sliding scale at present due to volatile readings.    -appreciate Dr. ElCordelia Penne-time consultation  -sugars may be showing some stabilization 8. ChronicBLE wounds with chronically elevated  ESR: continue doxycline for 30 days (started 02/02/14). Follow up with ID after discharge. Continue local wound care.   -right elbow granulating in  -wet-dry with allevyn over right popliteal area 9. Constipation/diabetic gastroparesis/neurogenic bowel: reglan causing cramping--dc . Bowel program - D/C miralx, cont colace and bisacodyl supp 10. Severe diabetic peripheral neuropathy:resume neurontin 159m bid an 4016m qhs   11. Pulmonary:  deep breathing, IS/FV, CPAP -quad cough, assistive techniques  -keep hob at 30 degrees  -chest PT 12.  Low grade temp:   -labs ok ,cxr ok  -IS, observation 13. CV---increased midodrine to 57m87mcontinue binder/teds   LOS (Days) 27 A FACE TO FACE EVALUATION WAS PERFORMED  ZacMeredith Staggers28/2015, 8:44 AM      Subjective/Complaints: Still having upper back pain.  A 12 point review of systems has been performed and if not noted above is otherwise negative.   Objective: Vital Signs: Blood pressure 106/71, pulse 86, temperature 98.2 F (36.8 C), temperature source Oral, resp. rate 18, weight 58.4 kg (128 lb 12 oz), SpO2 100.00%. No results found. Results for orders placed during the hospital encounter of 02/05/14 (from the past 72 hour(s))  GLUCOSE, CAPILLARY     Status: Abnormal   Collection Time    03/01/14 11:19 AM      Result Value Ref Range   Glucose-Capillary 549 (*) 70 - 99 mg/dL  GLUCOSE, RANDOM     Status: Abnormal   Collection Time    03/01/14 11:56 AM      Result Value Ref Range   Glucose, Bld 454 (*) 70 - 99 mg/dL  GLUCOSE, CAPILLARY     Status: Abnormal   Collection Time    03/01/14  1:24 PM      Result Value Ref Range   Glucose-Capillary 377 (*) 70 - 99 mg/dL  GLUCOSE, CAPILLARY     Status: Abnormal   Collection Time    03/01/14  4:21 PM      Result Value Ref Range   Glucose-Capillary 277 (*) 70 - 99 mg/dL   Comment 1 Notify RN    GLUCOSE, CAPILLARY     Status: Abnormal   Collection Time    03/01/14  9:03 PM      Result Value Ref Range   Glucose-Capillary 155 (*) 70 - 99 mg/dL  GLUCOSE, CAPILLARY     Status: Abnormal   Collection Time    03/02/14  2:53 AM      Result Value Ref Range   Glucose-Capillary 411 (*) 70 - 99 mg/dL   Comment 1 Notify RN    GLUCOSE, CAPILLARY     Status: Abnormal   Collection Time    03/02/14  6:49 AM      Result Value Ref Range   Glucose-Capillary 385 (*) 70 - 99 mg/dL  GLUCOSE, CAPILLARY     Status: Abnormal   Collection Time    03/02/14 11:31 AM      Result Value Ref Range   Glucose-Capillary 158 (*) 70 - 99  mg/dL  GLUCOSE, CAPILLARY     Status: None   Collection Time    03/02/14  4:21 PM      Result Value Ref Range   Glucose-Capillary 89  70 - 99 mg/dL  GLUCOSE, CAPILLARY     Status: Abnormal   Collection Time    03/02/14  9:18 PM      Result Value Ref Range   Glucose-Capillary 165 (*) 70 - 99 mg/dL  GLUCOSE, CAPILLARY  Status: Abnormal   Collection Time    03/03/14  2:54 AM      Result Value Ref Range   Glucose-Capillary 234 (*) 70 - 99 mg/dL  BASIC METABOLIC PANEL     Status: Abnormal   Collection Time    03/03/14  5:00 AM      Result Value Ref Range   Sodium 139  137 - 147 mEq/L   Potassium 4.4  3.7 - 5.3 mEq/L   Chloride 104  96 - 112 mEq/L   CO2 24  19 - 32 mEq/L   Glucose, Bld 227 (*) 70 - 99 mg/dL   BUN 37 (*) 6 - 23 mg/dL   Creatinine, Ser 0.83  0.50 - 1.35 mg/dL   Calcium 10.9 (*) 8.4 - 10.5 mg/dL   GFR calc non Af Amer >90  >90 mL/min   GFR calc Af Amer >90  >90 mL/min   Comment: (NOTE)     The eGFR has been calculated using the CKD EPI equation.     This calculation has not been validated in all clinical situations.     eGFR's persistently <90 mL/min signify possible Chronic Kidney     Disease.  CBC WITH DIFFERENTIAL     Status: Abnormal   Collection Time    03/03/14  5:00 AM      Result Value Ref Range   WBC 10.1  4.0 - 10.5 K/uL   RBC 3.31 (*) 4.22 - 5.81 MIL/uL   Hemoglobin 8.2 (*) 13.0 - 17.0 g/dL   Comment: REPEATED TO VERIFY   HCT 27.4 (*) 39.0 - 52.0 %   MCV 82.8  78.0 - 100.0 fL   MCH 24.8 (*) 26.0 - 34.0 pg   MCHC 29.9 (*) 30.0 - 36.0 g/dL   RDW 16.2 (*) 11.5 - 15.5 %   Platelets 605 (*) 150 - 400 K/uL   Comment: REPEATED TO VERIFY   Neutrophils Relative % 44  43 - 77 %   Lymphocytes Relative 37  12 - 46 %   Monocytes Relative 15 (*) 3 - 12 %   Eosinophils Relative 3  0 - 5 %   Basophils Relative 1  0 - 1 %   Neutro Abs 4.5  1.7 - 7.7 K/uL   Lymphs Abs 3.7  0.7 - 4.0 K/uL   Monocytes Absolute 1.5 (*) 0.1 - 1.0 K/uL   Eosinophils Absolute  0.3  0.0 - 0.7 K/uL   Basophils Absolute 0.1  0.0 - 0.1 K/uL   Smear Review PLATELETS APPEAR INCREASED    GLUCOSE, CAPILLARY     Status: Abnormal   Collection Time    03/03/14  7:11 AM      Result Value Ref Range   Glucose-Capillary 215 (*) 70 - 99 mg/dL  GLUCOSE, CAPILLARY     Status: Abnormal   Collection Time    03/03/14 11:33 AM      Result Value Ref Range   Glucose-Capillary 110 (*) 70 - 99 mg/dL  GLUCOSE, CAPILLARY     Status: None   Collection Time    03/03/14  4:56 PM      Result Value Ref Range   Glucose-Capillary 84  70 - 99 mg/dL  GLUCOSE, CAPILLARY     Status: Abnormal   Collection Time    03/03/14  8:29 PM      Result Value Ref Range   Glucose-Capillary 151 (*) 70 - 99 mg/dL  GLUCOSE, CAPILLARY     Status: Abnormal  Collection Time    03/04/14  3:08 AM      Result Value Ref Range   Glucose-Capillary 222 (*) 70 - 99 mg/dL  GLUCOSE, CAPILLARY     Status: Abnormal   Collection Time    03/04/14  7:12 AM      Result Value Ref Range   Glucose-Capillary 162 (*) 70 - 99 mg/dL     HEENT: normal Cardio: RRR and no murmur Resp: CTA B/L and unlabored GI: BS positive and mildly distended and tympanitic, mild tenderness Extremity:  No Edema Skin:  . Neuro: Alert/Oriented, Abnormal Sensory absent in feet and Abnormal Motor 0/5 in LE,0/5 finger extension and 0/5 hand intrinsic. Wrist extension 2-, 3/5 tricep   2, biceps 5/5. Decreased sensation below elbows bilaterally but able to ID LT in Left C6-7 dermatomes. ?trace HF on right. Musc/Skel:  Generalized upper thoracic spine tenderness.  Gen NAD   Assessment/Plan: 1. 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 Physiatrist is providing close team supervision and 24 hour management of active medical problems listed below. Physiatrist and rehab team continue to assess barriers to discharge/monitor patient progress  toward functional and medical goals.   FIM: FIM - Bathing Bathing Steps Patient Completed: Chest;Right Arm;Left Arm;Abdomen;Front perineal area Bathing: 3: Mod-Patient completes 5-7 32f10 parts or 50-74% (per patient and patient's mother)  FIM - Upper Body Dressing/Undressing Upper body dressing/undressing steps patient completed: Thread/unthread right sleeve of pullover shirt/dresss;Thread/unthread left sleeve of pullover shirt/dress;Put head through opening of pull over shirt/dress;Pull shirt over trunk Upper body dressing/undressing: 5: Supervision: Safety issues/verbal cues FIM - Lower Body Dressing/Undressing Lower body dressing/undressing steps patient completed: Thread/unthread right pants leg;Thread/unthread left pants leg Lower body dressing/undressing: 3: Mod-Patient completed 50-74% of tasks (per patient and patient's mother)  FIM - Toileting Toileting: 1: Total-Patient completed zero steps, helper did all 3  FIM - TRadio producerDevices: Bedside commode (padded tub bench with cut out) Toilet Transfers: 1-Mechanical lift (Optician, dispensing  FIM - BControl and instrumentation engineerDevices: Arm rests;HOB elevated;Bed rails;Sliding board Bed/Chair Transfer: 2: Bed > Chair or W/C: Max A (lift and lower assist);3: Supine > Sit: Mod A (lifting assist/Pt. 50-74%/lift 2 legs;2: Sit > Supine: Max A (lifting assist/Pt. 25-49%);2: Chair or W/C > Bed: Max A (lift and lower assist);1: Two helpers (sliding board used swith Mom assiting)  FIM - Locomotion: Wheelchair Distance: 25 Locomotion: Wheelchair: 0: Activity did not occur FIM - Locomotion: Ambulation Locomotion: Ambulation: 0: Activity did not occur  Comprehension Comprehension Mode: Auditory Comprehension: 6-Follows complex conversation/direction: With extra time/assistive device  Expression Expression Mode: Verbal Expression: 6-Expresses complex ideas: With extra time/assistive  device  Social Interaction Social Interaction: 6-Interacts appropriately with others with medication or extra time (anti-anxiety, antidepressant).  Problem Solving Problem Solving: 6-Solves complex problems: With extra time  Memory Memory: 6-More than reasonable amt of time  Medical Problem List and Plan:  1. 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 DVT Prophylaxis/Anticoagulation: Pharmaceutical: Lovenox  3. Chronic Pain Management: increase MS contin to 45 mg bid. Air bed for back and buttock pain as well as pressure relief measures.      -reminded him to use heating pad for shoulder/neck symptoms, good posture when in bed and chair  -continues robaxin. TPI x5 performed yesterday with good results to left periscapular area and traps  -his clavicle fracture  also in this general area with distraction laterally---denies any pain in this area.  -lyrica 80m bid for neuropathic pain---increase to TID 4. Mood: reviewed anxiety with patient. Discussed triggers and ways to de-escalate. Also have added prn xanax if he is unable to manage it. 5. Neuropsych: This patient is capable of making decisions on his own behalf.  6. Spinal cord infarct: steroids off-   7. DM type 2 with gastroparesis: increase lantus to 30u qhs . increased novolog to 8u units tid ac yesterday  -hold on sliding scale at present due to volatile readings.    -appreciate Dr. ECordelia Penone-time consultation 8. ChronicBLE wounds with chronically elevated ESR: continue doxycline for 30 days (started 02/02/14). Follow up with ID after discharge. Continue local wound care.   -right elbow granulating in  -wet-dry with allevyn over right popliteal area 9. Constipation/diabetic gastroparesis/neurogenic bowel: reglan causing cramping--dc . Bowel program - D/C miralx, cont colace and bisacodyl supp 10. Severe diabetic peripheral neuropathy:resume neurontin 1014m bid an 40054mqhs   11. Pulmonary:  deep breathing, IS/FV, CPAP -quad cough, assistive techniques  -keep hob at 30 degrees  -chest PT 12. Low grade temp:   -labs ok ,cxr ok  -IS, observation 13. CV---increase midodrine to 5mg43mLOS (Days) 27 A FACE TO FACE EVALUATION WAS PERFORMED  ZachMeredith Staggers8/2015, 8:44 AM      Subjective/Complaints: Hand pain worse. Had "bulging" in his belly yesterday A 12 point review of systems has been performed and if not noted above is otherwise negative.   Objective: Vital Signs: Blood pressure 106/71, pulse 86, temperature 98.2 F (36.8 C), temperature source Oral, resp. rate 18, weight 58.4 kg (128 lb 12 oz), SpO2 100.00%. No results found. Results for orders placed during the hospital encounter of 02/05/14 (from the past 72 hour(s))  GLUCOSE, CAPILLARY     Status: Abnormal   Collection Time    03/01/14 11:19 AM      Result Value Ref Range   Glucose-Capillary 549 (*) 70 - 99 mg/dL  GLUCOSE, RANDOM     Status: Abnormal   Collection Time    03/01/14 11:56 AM      Result Value Ref Range   Glucose, Bld 454 (*) 70 - 99 mg/dL  GLUCOSE, CAPILLARY     Status: Abnormal   Collection Time    03/01/14  1:24 PM      Result Value Ref Range   Glucose-Capillary 377 (*) 70 - 99 mg/dL  GLUCOSE, CAPILLARY     Status: Abnormal   Collection Time    03/01/14  4:21 PM      Result Value Ref Range   Glucose-Capillary 277 (*) 70 - 99 mg/dL   Comment 1 Notify RN    GLUCOSE, CAPILLARY     Status: Abnormal   Collection Time    03/01/14  9:03 PM      Result Value Ref Range   Glucose-Capillary 155 (*) 70 - 99 mg/dL  GLUCOSE, CAPILLARY     Status: Abnormal   Collection Time    03/02/14  2:53 AM      Result Value Ref Range   Glucose-Capillary 411 (*) 70 - 99 mg/dL   Comment 1 Notify RN    GLUCOSE, CAPILLARY     Status: Abnormal   Collection Time    03/02/14  6:49 AM      Result Value Ref Range   Glucose-Capillary 385 (*) 70 - 99 mg/dL  GLUCOSE,  CAPILLARY  Status: Abnormal   Collection Time    03/02/14 11:31 AM      Result Value Ref Range   Glucose-Capillary 158 (*) 70 - 99 mg/dL  GLUCOSE, CAPILLARY     Status: None   Collection Time    03/02/14  4:21 PM      Result Value Ref Range   Glucose-Capillary 89  70 - 99 mg/dL  GLUCOSE, CAPILLARY     Status: Abnormal   Collection Time    03/02/14  9:18 PM      Result Value Ref Range   Glucose-Capillary 165 (*) 70 - 99 mg/dL  GLUCOSE, CAPILLARY     Status: Abnormal   Collection Time    03/03/14  2:54 AM      Result Value Ref Range   Glucose-Capillary 234 (*) 70 - 99 mg/dL  BASIC METABOLIC PANEL     Status: Abnormal   Collection Time    03/03/14  5:00 AM      Result Value Ref Range   Sodium 139  137 - 147 mEq/L   Potassium 4.4  3.7 - 5.3 mEq/L   Chloride 104  96 - 112 mEq/L   CO2 24  19 - 32 mEq/L   Glucose, Bld 227 (*) 70 - 99 mg/dL   BUN 37 (*) 6 - 23 mg/dL   Creatinine, Ser 0.83  0.50 - 1.35 mg/dL   Calcium 10.9 (*) 8.4 - 10.5 mg/dL   GFR calc non Af Amer >90  >90 mL/min   GFR calc Af Amer >90  >90 mL/min   Comment: (NOTE)     The eGFR has been calculated using the CKD EPI equation.     This calculation has not been validated in all clinical situations.     eGFR's persistently <90 mL/min signify possible Chronic Kidney     Disease.  CBC WITH DIFFERENTIAL     Status: Abnormal   Collection Time    03/03/14  5:00 AM      Result Value Ref Range   WBC 10.1  4.0 - 10.5 K/uL   RBC 3.31 (*) 4.22 - 5.81 MIL/uL   Hemoglobin 8.2 (*) 13.0 - 17.0 g/dL   Comment: REPEATED TO VERIFY   HCT 27.4 (*) 39.0 - 52.0 %   MCV 82.8  78.0 - 100.0 fL   MCH 24.8 (*) 26.0 - 34.0 pg   MCHC 29.9 (*) 30.0 - 36.0 g/dL   RDW 16.2 (*) 11.5 - 15.5 %   Platelets 605 (*) 150 - 400 K/uL   Comment: REPEATED TO VERIFY   Neutrophils Relative % 44  43 - 77 %   Lymphocytes Relative 37  12 - 46 %   Monocytes Relative 15 (*) 3 - 12 %   Eosinophils Relative 3  0 - 5 %   Basophils Relative 1  0 - 1 %    Neutro Abs 4.5  1.7 - 7.7 K/uL   Lymphs Abs 3.7  0.7 - 4.0 K/uL   Monocytes Absolute 1.5 (*) 0.1 - 1.0 K/uL   Eosinophils Absolute 0.3  0.0 - 0.7 K/uL   Basophils Absolute 0.1  0.0 - 0.1 K/uL   Smear Review PLATELETS APPEAR INCREASED    GLUCOSE, CAPILLARY     Status: Abnormal   Collection Time    03/03/14  7:11 AM      Result Value Ref Range   Glucose-Capillary 215 (*) 70 - 99 mg/dL  GLUCOSE, CAPILLARY     Status: Abnormal  Collection Time    03/03/14 11:33 AM      Result Value Ref Range   Glucose-Capillary 110 (*) 70 - 99 mg/dL  GLUCOSE, CAPILLARY     Status: None   Collection Time    03/03/14  4:56 PM      Result Value Ref Range   Glucose-Capillary 84  70 - 99 mg/dL  GLUCOSE, CAPILLARY     Status: Abnormal   Collection Time    03/03/14  8:29 PM      Result Value Ref Range   Glucose-Capillary 151 (*) 70 - 99 mg/dL  GLUCOSE, CAPILLARY     Status: Abnormal   Collection Time    03/04/14  3:08 AM      Result Value Ref Range   Glucose-Capillary 222 (*) 70 - 99 mg/dL  GLUCOSE, CAPILLARY     Status: Abnormal   Collection Time    03/04/14  7:12 AM      Result Value Ref Range   Glucose-Capillary 162 (*) 70 - 99 mg/dL     HEENT: normal Cardio: RRR and no murmur Resp: CTA B/L and unlabored GI: BS positive and mildly distended and tympanitic, mild tenderness Extremity:  No Edema Skin:  . Neuro: Alert/Oriented, Abnormal Sensory absent in feet and Abnormal Motor 0/5 in LE,0/5 finger extension and 0/5 hand intrinsic. Wrist extension 2-, 3/5 tricep   2, biceps 5/5. Decreased sensation below elbows bilaterally but able to ID LT in Left C6-7 dermatomes. ?trace HF on right. Musc/Skel:  Generalized upper thoracic spine tenderness.  Gen NAD   Assessment/Plan: 1. 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 Physiatrist is providing close team supervision and 24 hour management of  active medical problems listed below. Physiatrist and rehab team continue to assess barriers to discharge/monitor patient progress toward functional and medical goals.   FIM: FIM - Bathing Bathing Steps Patient Completed: Chest;Right Arm;Left Arm;Abdomen;Front perineal area Bathing: 3: Mod-Patient completes 5-7 43f10 parts or 50-74% (per patient and patient's mother)  FIM - Upper Body Dressing/Undressing Upper body dressing/undressing steps patient completed: Thread/unthread right sleeve of pullover shirt/dresss;Thread/unthread left sleeve of pullover shirt/dress;Put head through opening of pull over shirt/dress;Pull shirt over trunk Upper body dressing/undressing: 5: Supervision: Safety issues/verbal cues FIM - Lower Body Dressing/Undressing Lower body dressing/undressing steps patient completed: Thread/unthread right pants leg;Thread/unthread left pants leg Lower body dressing/undressing: 3: Mod-Patient completed 50-74% of tasks (per patient and patient's mother)  FIM - Toileting Toileting: 1: Total-Patient completed zero steps, helper did all 3  FIM - TRadio producerDevices: Bedside commode (padded tub bench with cut out) Toilet Transfers: 1-Mechanical lift (Optician, dispensing  FIM - BControl and instrumentation engineerDevices: Arm rests;HOB elevated;Bed rails;Sliding board Bed/Chair Transfer: 2: Bed > Chair or W/C: Max A (lift and lower assist);3: Supine > Sit: Mod A (lifting assist/Pt. 50-74%/lift 2 legs;2: Sit > Supine: Max A (lifting assist/Pt. 25-49%);2: Chair or W/C > Bed: Max A (lift and lower assist);1: Two helpers (sliding board used swith Mom assiting)  FIM - Locomotion: Wheelchair Distance: 25 Locomotion: Wheelchair: 0: Activity did not occur FIM - Locomotion: Ambulation Locomotion: Ambulation: 0: Activity did not occur  Comprehension Comprehension Mode: Auditory Comprehension: 6-Follows complex conversation/direction: With extra  time/assistive device  Expression Expression Mode: Verbal Expression: 6-Expresses complex ideas: With extra time/assistive device  Social Interaction Social Interaction: 6-Interacts appropriately with others with medication or extra time (anti-anxiety, antidepressant).  Problem Solving Problem Solving: 6-Solves complex problems: With extra time  Memory Memory: 6-More than reasonable amt of time  Medical Problem List and Plan:  1. 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 DVT Prophylaxis/Anticoagulation: Pharmaceutical: Lovenox  3. Chronic Pain Management: increase MS contin to 45 mg bid. Air bed for back and buttock pain as well as pressure relief measures.      -reminded him to use heating pad for shoulder/neck symptoms, good posture when in bed and chair  -continues robaxin. TPI x5 performed yesterday with good results to left periscapular area and traps  -his clavicle fracture also in this general area with distraction laterally---denies any pain in this area.  -lyrica 40m bid for neuropathic pain 4. Mood: reviewed anxiety with patient. Discussed triggers and ways to de-escalate. Also have added prn xanax if he is unable to manage it. 5. Neuropsych: This patient is capable of making decisions on his own behalf.  6. Spinal cord infarct: steroids off-   7. DM type 2 with gastroparesis: increased lantus to 25 units qhs (gradually increase as needed) . increase novolog to 8u units tid ac  -hold on sliding scale at present due to volatile readings.    -appreciate Dr. ECordelia Penone-time consultation 8. ChronicBLE wounds with chronically elevated ESR: continue doxycline for 30 days (started 02/02/14). Follow up with ID after discharge. Continue local wound care.   -right elbow granulating in  -wet-dry with allevyn over right popliteal area 9. Constipation/diabetic gastroparesis/neurogenic bowel: reglan causing  cramping--dc . Bowel program - D/C miralx, cont colace and bisacodyl supp 10. Severe diabetic peripheral neuropathy:resume neurontin 1013mbid an 40064mqhs   11. Pulmonary:  deep breathing, IS/FV, CPAP -quad cough, assistive techniques  -keep hob at 30 degrees  -chest PT 12. Low grade temp:   -labs ok ,cxr ok  -IS, observation 13. CV---increased midodrine to 5mg54mLOS (Days) 27 A FACE TO FACE EVALUATION WAS PERFORMED  ZachMeredith Staggers8/2015, 8:44 AM      Subjective/Complaints: Still having upper back pain.  A 12 point review of systems has been performed and if not noted above is otherwise negative.   Objective: Vital Signs: Blood pressure 106/71, pulse 86, temperature 98.2 F (36.8 C), temperature source Oral, resp. rate 18, weight 58.4 kg (128 lb 12 oz), SpO2 100.00%. No results found. Results for orders placed during the hospital encounter of 02/05/14 (from the past 72 hour(s))  GLUCOSE, CAPILLARY     Status: Abnormal   Collection Time    03/01/14 11:19 AM      Result Value Ref Range   Glucose-Capillary 549 (*) 70 - 99 mg/dL  GLUCOSE, RANDOM     Status: Abnormal   Collection Time    03/01/14 11:56 AM      Result Value Ref Range   Glucose, Bld 454 (*) 70 - 99 mg/dL  GLUCOSE, CAPILLARY     Status: Abnormal   Collection Time    03/01/14  1:24 PM      Result Value Ref Range   Glucose-Capillary 377 (*) 70 - 99 mg/dL  GLUCOSE, CAPILLARY     Status: Abnormal   Collection Time    03/01/14  4:21 PM      Result Value Ref Range   Glucose-Capillary 277 (*) 70 - 99 mg/dL   Comment 1 Notify RN    GLUCOSE, CAPILLARY     Status: Abnormal   Collection Time  03/01/14  9:03 PM      Result Value Ref Range   Glucose-Capillary 155 (*) 70 - 99 mg/dL  GLUCOSE, CAPILLARY     Status: Abnormal   Collection Time    03/02/14  2:53 AM      Result Value Ref Range   Glucose-Capillary 411 (*) 70 - 99 mg/dL   Comment 1 Notify RN    GLUCOSE, CAPILLARY     Status: Abnormal    Collection Time    03/02/14  6:49 AM      Result Value Ref Range   Glucose-Capillary 385 (*) 70 - 99 mg/dL  GLUCOSE, CAPILLARY     Status: Abnormal   Collection Time    03/02/14 11:31 AM      Result Value Ref Range   Glucose-Capillary 158 (*) 70 - 99 mg/dL  GLUCOSE, CAPILLARY     Status: None   Collection Time    03/02/14  4:21 PM      Result Value Ref Range   Glucose-Capillary 89  70 - 99 mg/dL  GLUCOSE, CAPILLARY     Status: Abnormal   Collection Time    03/02/14  9:18 PM      Result Value Ref Range   Glucose-Capillary 165 (*) 70 - 99 mg/dL  GLUCOSE, CAPILLARY     Status: Abnormal   Collection Time    03/03/14  2:54 AM      Result Value Ref Range   Glucose-Capillary 234 (*) 70 - 99 mg/dL  BASIC METABOLIC PANEL     Status: Abnormal   Collection Time    03/03/14  5:00 AM      Result Value Ref Range   Sodium 139  137 - 147 mEq/L   Potassium 4.4  3.7 - 5.3 mEq/L   Chloride 104  96 - 112 mEq/L   CO2 24  19 - 32 mEq/L   Glucose, Bld 227 (*) 70 - 99 mg/dL   BUN 37 (*) 6 - 23 mg/dL   Creatinine, Ser 0.83  0.50 - 1.35 mg/dL   Calcium 10.9 (*) 8.4 - 10.5 mg/dL   GFR calc non Af Amer >90  >90 mL/min   GFR calc Af Amer >90  >90 mL/min   Comment: (NOTE)     The eGFR has been calculated using the CKD EPI equation.     This calculation has not been validated in all clinical situations.     eGFR's persistently <90 mL/min signify possible Chronic Kidney     Disease.  CBC WITH DIFFERENTIAL     Status: Abnormal   Collection Time    03/03/14  5:00 AM      Result Value Ref Range   WBC 10.1  4.0 - 10.5 K/uL   RBC 3.31 (*) 4.22 - 5.81 MIL/uL   Hemoglobin 8.2 (*) 13.0 - 17.0 g/dL   Comment: REPEATED TO VERIFY   HCT 27.4 (*) 39.0 - 52.0 %   MCV 82.8  78.0 - 100.0 fL   MCH 24.8 (*) 26.0 - 34.0 pg   MCHC 29.9 (*) 30.0 - 36.0 g/dL   RDW 16.2 (*) 11.5 - 15.5 %   Platelets 605 (*) 150 - 400 K/uL   Comment: REPEATED TO VERIFY   Neutrophils Relative % 44  43 - 77 %   Lymphocytes Relative  37  12 - 46 %   Monocytes Relative 15 (*) 3 - 12 %   Eosinophils Relative 3  0 - 5 %   Basophils  Relative 1  0 - 1 %   Neutro Abs 4.5  1.7 - 7.7 K/uL   Lymphs Abs 3.7  0.7 - 4.0 K/uL   Monocytes Absolute 1.5 (*) 0.1 - 1.0 K/uL   Eosinophils Absolute 0.3  0.0 - 0.7 K/uL   Basophils Absolute 0.1  0.0 - 0.1 K/uL   Smear Review PLATELETS APPEAR INCREASED    GLUCOSE, CAPILLARY     Status: Abnormal   Collection Time    03/03/14  7:11 AM      Result Value Ref Range   Glucose-Capillary 215 (*) 70 - 99 mg/dL  GLUCOSE, CAPILLARY     Status: Abnormal   Collection Time    03/03/14 11:33 AM      Result Value Ref Range   Glucose-Capillary 110 (*) 70 - 99 mg/dL  GLUCOSE, CAPILLARY     Status: None   Collection Time    03/03/14  4:56 PM      Result Value Ref Range   Glucose-Capillary 84  70 - 99 mg/dL  GLUCOSE, CAPILLARY     Status: Abnormal   Collection Time    03/03/14  8:29 PM      Result Value Ref Range   Glucose-Capillary 151 (*) 70 - 99 mg/dL  GLUCOSE, CAPILLARY     Status: Abnormal   Collection Time    03/04/14  3:08 AM      Result Value Ref Range   Glucose-Capillary 222 (*) 70 - 99 mg/dL  GLUCOSE, CAPILLARY     Status: Abnormal   Collection Time    03/04/14  7:12 AM      Result Value Ref Range   Glucose-Capillary 162 (*) 70 - 99 mg/dL     HEENT: normal Cardio: RRR and no murmur Resp: CTA B/L and unlabored GI: BS positive and mildly distended and tympanitic, mild tenderness Extremity:  No Edema Skin:  . Neuro: Alert/Oriented, Abnormal Sensory absent in feet and Abnormal Motor 0/5 in LE,0/5 finger extension and 0/5 hand intrinsic. Wrist extension 2-, 3/5 tricep   2, biceps 5/5. Decreased sensation below elbows bilaterally but able to ID LT in Left C6-7 dermatomes. ?trace HF on right. Musc/Skel:  Generalized upper thoracic spine tenderness.  Gen NAD   Assessment/Plan: 1. Functional deficits secondary to tetraparesis which require 3+ hours per day of interdisciplinary  therapy in a comprehensive inpatient rehab setting. Physiatrist is providing close team supervision and 24 hour management of active medical problems listed below. Physiatrist and rehab team continue to assess barriers to discharge/monitor patient progress toward functional and medical goals.   FIM: FIM - Bathing Bathing Steps Patient Completed: Chest;Right Arm;Left Arm;Abdomen;Front perineal area Bathing: 3: Mod-Patient completes 5-7 46f10 parts or 50-74% (per patient and patient's mother)  FIM - Upper Body Dressing/Undressing Upper body dressing/undressing steps patient completed: Thread/unthread right sleeve of pullover shirt/dresss;Thread/unthread left sleeve of pullover shirt/dress;Put head through opening of pull over shirt/dress;Pull shirt over trunk Upper body dressing/undressing: 5: Supervision: Safety issues/verbal cues FIM - Lower Body Dressing/Undressing Lower body dressing/undressing steps patient completed: Thread/unthread right pants leg;Thread/unthread left pants leg Lower body dressing/undressing: 3: Mod-Patient completed 50-74% of tasks (per patient and patient's mother)  FIM - Toileting Toileting: 1: Total-Patient completed zero steps, helper did all 3  FIM - TRadio producerDevices: Bedside commode (padded tub bench with cut out) Toilet Transfers: 1-Mechanical lift (Optician, dispensing  FIM - BControl and instrumentation engineerDevices: Arm rests;HOB elevated;Bed rails;Sliding board Bed/Chair Transfer: 2: Bed >  Chair or W/C: Max A (lift and lower assist);3: Supine > Sit: Mod A (lifting assist/Pt. 50-74%/lift 2 legs;2: Sit > Supine: Max A (lifting assist/Pt. 25-49%);2: Chair or W/C > Bed: Max A (lift and lower assist);1: Two helpers (sliding board used swith Mom assiting)  FIM - Locomotion: Wheelchair Distance: 25 Locomotion: Wheelchair: 0: Activity did not occur FIM - Locomotion: Ambulation Locomotion: Ambulation: 0: Activity did not  occur  Comprehension Comprehension Mode: Auditory Comprehension: 6-Follows complex conversation/direction: With extra time/assistive device  Expression Expression Mode: Verbal Expression: 6-Expresses complex ideas: With extra time/assistive device  Social Interaction Social Interaction: 6-Interacts appropriately with others with medication or extra time (anti-anxiety, antidepressant).  Problem Solving Problem Solving: 6-Solves complex problems: With extra time  Memory Memory: 6-More than reasonable amt of time  Medical Problem List and Plan:  1. 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 DVT Prophylaxis/Anticoagulation: Pharmaceutical: Lovenox  3. Chronic Pain Management: increase MS contin to 45 mg bid. Air bed for back and buttock pain as well as pressure relief measures.      -reminded him to use heating pad for shoulder/neck symptoms, good posture when in bed and chair  -continues robaxin (768m). TPI x5 performed yesterday with good results to left periscapular area and traps  -his clavicle fracture also in this general area with distraction laterally---denies any pain in this area.  -lyrica 764m TID  -needs better pain coping skills 4. Mood: reviewed anxiety with patient. Discussed triggers and ways to de-escalate. Also have added prn xanax if he is unable to manage it. 5. Neuropsych: This patient is capable of making decisions on his own behalf.  6. Spinal cord infarct: steroids off-   7. DM type 2 with gastroparesis: increase lantus to 34u qhs . maintain novolog to 8u units tid ac yesterday  -hold on sliding scale at present due to volatile readings.    -sugars are the best that they've been since admission 8. Chronic wounds with chronically elevated ESR: continue doxycline for 30 days (started 02/02/14). Follow up with ID after discharge. Continue local wound care.   -right elbow granulating  in  -wet-dry with allevyn over right popliteal area  -will need ongoing wound care after dc 9. Constipation/diabetic gastroparesis/neurogenic bowel: . Mar[facility] Kitchenowel program -cont bisacodyl supp  -will need further work at dcMatthewsSevere diabetic peripheral neuropathy:resume neurontin 10082mid an 400m30mhs   11. Pulmonary:  deep breathing, IS/FV, CPAP -quad cough, assistive techniques  -keep hob at 30 degrees  -chest PT 12. Low grade temp:   -labs ok ,cxr ok  -IS, observation 13. CV---increase midodrine to 5mg 20mOS (Days) 27 A FACE TO FACE EVALUATION WAS PERFORMED  ZachaMeredith Staggers/2015, 8:44 AM

## 2014-03-04 NOTE — Significant Event (Signed)
Hypoglycemic Event  CBG: 47  Treatment: glucose gel  Symptoms: Sweaty and Vision changes  Follow-up CBG: Time:1100  Result: 42  Possible Reasons for Event: Unknown  Comments/MD notified:continuing to monitor patient    Donnelly Stager  Remember to initiate Hypoglycemia Order Set & complete

## 2014-03-04 NOTE — Progress Notes (Addendum)
At 1609 pm patient's blood glucose 419, patient asymptomatic. Notified Pam Love, PA given order to administer 8 units of scheduled Novolog and recheck.  Rechecked at [place] pm for 474. Notified Algis Liming, PA given order to administer 2 units of Novolog and recheck blood glucose at 2100 pm and 2200 pm. Patient given 2 units of Novolog, and night shift nurse notified to recheck blood glucose at 2100 pm and 2200 pm.

## 2014-03-04 NOTE — Progress Notes (Signed)
Nursing Note: Pt fingerstick checked for 467 mg/dl.Lab was in computer for 495 mg/dl.A; Paged on-call Pam love,PA and made aware of fingerstick that was just done and of lab result. No insulin to be given at this time as pt is brittle.To recheck cbg hourly to assess that it is coming down. wbb

## 2014-03-05 DIAGNOSIS — G9519 Other vascular myelopathies: Secondary | ICD-10-CM

## 2014-03-05 DIAGNOSIS — L02419 Cutaneous abscess of limb, unspecified: Secondary | ICD-10-CM

## 2014-03-05 DIAGNOSIS — K3184 Gastroparesis: Secondary | ICD-10-CM

## 2014-03-05 DIAGNOSIS — E1149 Type 2 diabetes mellitus with other diabetic neurological complication: Secondary | ICD-10-CM

## 2014-03-05 DIAGNOSIS — G8250 Quadriplegia, unspecified: Secondary | ICD-10-CM

## 2014-03-05 DIAGNOSIS — L03119 Cellulitis of unspecified part of limb: Secondary | ICD-10-CM

## 2014-03-05 DIAGNOSIS — E291 Testicular hypofunction: Secondary | ICD-10-CM

## 2014-03-05 LAB — BASIC METABOLIC PANEL
BUN: 46 mg/dL — ABNORMAL HIGH (ref 6–23)
BUN: 46 mg/dL — ABNORMAL HIGH (ref 6–23)
CHLORIDE: 96 meq/L (ref 96–112)
CO2: 24 mEq/L (ref 19–32)
CO2: 24 mEq/L (ref 19–32)
Calcium: 9.9 mg/dL (ref 8.4–10.5)
Calcium: 10.4 mg/dL (ref 8.4–10.5)
Chloride: 98 mEq/L (ref 96–112)
Creatinine, Ser: 1.02 mg/dL (ref 0.50–1.35)
Creatinine, Ser: 0.96 mg/dL (ref 0.50–1.35)
GFR calc Af Amer: >90 mL/min (ref >90)
GFR calc Af Amer: >90 mL/min (ref >90)
GFR calc non Af Amer: >90 mL/min (ref >90)
GFR calc non Af Amer: >90 mL/min (ref >90)
Glucose, Bld: 537 mg/dL — ABNORMAL HIGH (ref 70–99)
Glucose, Bld: 383 mg/dL — ABNORMAL HIGH (ref 70–99)
Potassium: 4.9 mEq/L (ref 3.7–5.3)
Potassium: 4.8 mEq/L (ref 3.7–5.3)
Sodium: 132 mEq/L — ABNORMAL LOW (ref 137–147)
Sodium: 135 mEq/L — ABNORMAL LOW (ref 137–147)

## 2014-03-05 LAB — GLUCOSE, CAPILLARY
GLUCOSE-CAPILLARY: 218 mg/dL — AB (ref 70–99)
GLUCOSE-CAPILLARY: 336 mg/dL — AB (ref 70–99)
GLUCOSE-CAPILLARY: 457 mg/dL — AB (ref 70–99)
GLUCOSE-CAPILLARY: 460 mg/dL — AB (ref 70–99)
GLUCOSE-CAPILLARY: 497 mg/dL — AB (ref 70–99)
Glucose-Capillary: 510 mg/dL — ABNORMAL HIGH (ref 70–99)
Glucose-Capillary: 405 mg/dL — ABNORMAL HIGH (ref 70–99)
Glucose-Capillary: 157 mg/dL — ABNORMAL HIGH (ref 70–99)
Glucose-Capillary: 160 mg/dL — ABNORMAL HIGH (ref 70–99)

## 2014-03-05 LAB — CBC WITH DIFFERENTIAL/PLATELET
BASOS PCT: 0 % (ref 0–1)
Basophils Absolute: 0 10*3/uL (ref 0.0–0.1)
EOS ABS: 0.1 10*3/uL (ref 0.0–0.7)
Eosinophils Relative: 1 % (ref 0–5)
HEMATOCRIT: 25.7 % — AB (ref 39.0–52.0)
HEMOGLOBIN: 7.9 g/dL — AB (ref 13.0–17.0)
LYMPHS PCT: 36 % (ref 12–46)
Lymphs Abs: 3.9 10*3/uL (ref 0.7–4.0)
MCH: 25 pg — ABNORMAL LOW (ref 26.0–34.0)
MCHC: 30.7 g/dL (ref 30.0–36.0)
MCV: 81.3 fL (ref 78.0–100.0)
Monocytes Absolute: 1.4 10*3/uL — ABNORMAL HIGH (ref 0.1–1.0)
Monocytes Relative: 13 % — ABNORMAL HIGH (ref 3–12)
NEUTROS ABS: 5.5 10*3/uL (ref 1.7–7.7)
Neutrophils Relative %: 50 % (ref 43–77)
Platelets: 565 10*3/uL — ABNORMAL HIGH (ref 150–400)
RBC: 3.16 MIL/uL — ABNORMAL LOW (ref 4.22–5.81)
RDW: 16.1 % — ABNORMAL HIGH (ref 11.5–15.5)
WBC: 10.9 10*3/uL — ABNORMAL HIGH (ref 4.0–10.5)

## 2014-03-05 MED ORDER — METOCLOPRAMIDE HCL 10 MG/10ML PO SOLN: ORAL | Status: DC

## 2014-03-05 MED ORDER — PREGABALIN 75 MG PO CAPS: 75.0000 mg | ORAL_CAPSULE | Freq: Three times a day (TID) | ORAL | Status: DC

## 2014-03-05 MED ORDER — MORPHINE SULFATE ER 40 MG PO CP24: 40.0000 mg | ORAL_CAPSULE | Freq: Two times a day (BID) | ORAL | Status: DC

## 2014-03-05 MED ORDER — INSULIN ASPART 100 UNIT/ML ~~LOC~~ SOLN
2.0000 [IU] | SUBCUTANEOUS | Status: AC
  Administered 2014-03-05: 2 [IU] via SUBCUTANEOUS

## 2014-03-05 MED ORDER — SODIUM CHLORIDE 0.9 % IV SOLN
INTRAVENOUS | Status: AC
  Administered 2014-03-05: 02:00:00 via INTRAVENOUS

## 2014-03-05 MED ORDER — MIDODRINE HCL 5 MG PO TABS: 5.0000 mg | ORAL_TABLET | Freq: Two times a day (BID) | ORAL | Status: DC

## 2014-03-05 MED ORDER — FLUCONAZOLE 200 MG PO TABS
200.0000 mg | ORAL_TABLET | Freq: Every day | ORAL | Status: AC
  Administered 2014-03-05: 200 mg via ORAL
  Filled 2014-03-05: qty 1

## 2014-03-05 MED ORDER — SODIUM CHLORIDE 0.9 % IV SOLN
INTRAVENOUS | Status: AC
  Administered 2014-03-05: 04:00:00 via INTRAVENOUS

## 2014-03-05 MED ORDER — FLUCONAZOLE 100 MG PO TABS: 100.0000 mg | ORAL_TABLET | Freq: Every day | ORAL | Status: DC

## 2014-03-05 MED ORDER — INSULIN GLARGINE 100 UNIT/ML ~~LOC~~ SOLN: 30.0000 [IU] | Freq: Every day | SUBCUTANEOUS | Status: DC

## 2014-03-05 MED ORDER — SODIUM CHLORIDE 0.9 % IV BOLUS (SEPSIS)
200.0000 mL | Freq: Once | INTRAVENOUS | Status: AC
  Administered 2014-03-05: 200 mL via INTRAVENOUS

## 2014-03-05 MED ORDER — SIMETHICONE 80 MG PO CHEW: 160.0000 mg | CHEWABLE_TABLET | Freq: Four times a day (QID) | ORAL | Status: DC | PRN

## 2014-03-05 MED ORDER — LORATADINE 10 MG PO TABS: 10.0000 mg | ORAL_TABLET | Freq: Every day | ORAL | Status: DC

## 2014-03-05 MED ORDER — SACCHAROMYCES BOULARDII 250 MG PO CAPS: 250.0000 mg | ORAL_CAPSULE | Freq: Two times a day (BID) | ORAL | Status: DC

## 2014-03-05 MED ORDER — SODIUM CHLORIDE 0.9 % IV SOLN
INTRAVENOUS | Status: AC
  Administered 2014-03-05: 100 mL/h via INTRAVENOUS

## 2014-03-05 MED ORDER — CIPROFLOXACIN HCL 250 MG PO TABS: 250.0000 mg | ORAL_TABLET | Freq: Two times a day (BID) | ORAL | Status: DC

## 2014-03-05 MED ORDER — LIDOCAINE HCL 2 % EX GEL: Freq: Four times a day (QID) | CUTANEOUS | Status: DC | PRN

## 2014-03-05 MED ORDER — OXYCODONE HCL 10 MG PO TABS: 5.0000 mg | ORAL_TABLET | Freq: Four times a day (QID) | ORAL | Status: DC | PRN

## 2014-03-05 MED ORDER — INSULIN ASPART 100 UNIT/ML ~~LOC~~ SOLN: 1.0000 [IU] | Freq: Four times a day (QID) | SUBCUTANEOUS | Status: DC

## 2014-03-05 MED ORDER — SODIUM CHLORIDE 0.9 % IV SOLN: INTRAVENOUS | Status: DC

## 2014-03-05 MED ORDER — METHOCARBAMOL 750 MG PO TABS: 750.0000 mg | ORAL_TABLET | Freq: Four times a day (QID) | ORAL | Status: DC | PRN

## 2014-03-05 MED ORDER — LIDOCAINE 5 % EX PTCH: MEDICATED_PATCH | CUTANEOUS | Status: DC

## 2014-03-05 MED ORDER — METOCLOPRAMIDE HCL 5 MG PO TABS: 5.0000 mg | ORAL_TABLET | Freq: Every day | ORAL | Status: DC

## 2014-03-05 NOTE — Progress Notes (Signed)
Social Work Patient ID: Jason [name], male   DOB: 1985-10-17, 28 y.o.   MRN: [id number] Pam-PA feels pt is medically stable to go home with Mom watching him closely.  Set up ambulance transportation for 3;30 to transport pt and Mom home. Mom aware and agreeable to this plan.  All discharge arrangements made via [place].

## 2014-03-05 NOTE — Discharge Summary (Signed)
Physician Discharge Summary  Patient ID: Jason [name] MRN: [id number] DOB/AGE: [birth year] 30 y.o.  Admit date: 02/05/2014 Discharge date: 03/08/2014  Discharge Diagnoses:  Principal Problem:   Spinal cord infarction Active Problems:   Type 1 diabetes, uncontrolled, with neuropathy   Painful diabetic neuropathy   Diabetic foot ulcer   UTI (urinary tract infection)   Discharged Condition: Stable.   Significant Diagnostic Studies: Dg Chest 2 View  02/20/2014   CLINICAL DATA:  History of diabetes.  Low grade fever.  EXAM: CHEST  2 VIEW  COMPARISON:  DG CHEST 1V PORT dated 02/17/2014  FINDINGS: Stable cardiac mediastinal contours. No consolidative pulmonary opacities. No pleural effusion or pneumothorax. Unchanged chronic fracture of the distal aspect of the left clavicle.  IMPRESSION: No acute cardiopulmonary process.   Electronically Signed   By: Lovey Newcomer M.D.   On: 02/20/2014 14:25    Dg Shoulder Left  02/13/2014   CLINICAL DATA:  Pain with movement  EXAM: LEFT SHOULDER - 2+ VIEW  COMPARISON:  DG SHOULDER*L* dated 07/06/2013  FINDINGS: There has been resorption along the fracture line of lateral aspect left clavicle, with adjacent sclerosis in the major fracture fragments. There has been retraction of the fracture fragments, now distracted approximately 7 mm. Coracoclavicular space is preserved. No new fracture or dislocation.  IMPRESSION: Nonunion of previously noted clavicle fracture, with 7 mm distraction of fracture fragments.   Electronically Signed   By: Arne Cleveland M.D.   On: 02/13/2014 15:25    Labs:  Basic Metabolic Panel:  Recent Labs Lab 03/03/14 0500 03/04/14 2203 03/05/14 0235 03/05/14 0652  NA 139  --  132* 135*  K 4.4  --  4.9 4.8  CL 104  --  96 98  CO2 24  --  24 24  GLUCOSE 227* 495* 537* 383*  BUN 37*  --  46* 46*  CREATININE 0.83  --  1.02 0.96  CALCIUM 10.9*  --  9.9 10.4    CBC:  Recent Labs Lab 03/03/14 0500 03/05/14 0652  WBC 10.1 10.9*   NEUTROABS 4.5 5.5  HGB 8.2* 7.9*  HCT 27.4* 25.7*  MCV 82.8 81.3  PLT 605* 565*    CBG:  Recent Labs Lab 03/05/14 0537 03/05/14 0743 03/05/14 1117 03/05/14 1151 03/05/14 1442  GLUCAP 405* 336* 218* 157* 160*    Brief HPI:   Jason [name] is a 28 y.o. male with h/o DM type 1, diabetic neuropathy with sensory deficits, as well as BLE diabetic ulcers x 4 month, h/o left elbow MRSA boil as well as gastroparesis who was admitted on 01/29/14 with lethargy, BLE weakness and inability to ambulate. He was treated with IV antibiotics for BLE cellulitis and possible PNA as well as insulin drip. Patient has worsening of MS with progressive BUE weakness on 01/20/14 due to SIRS/sepsis and treatment broadened with addition of fluconazole and acyclovir per ID input. LP with elevated glucose-132 and protein-111 and cultures negative. MRI of cervical and thoracic spine was done revealing progressive abnormal spina cord signal extending lower cervical cord C6 to  throughout upper and mid thoracic cord as far as T8 with question spinal cord infarct v/s transverse myelitis. Neurology feelt that patient symptoms were c/w spinal cord infarct and he was treated with  5 day course of high dose steroids and PT/OT recommended for protracted course of rehab.  Antibiotics were de-escalated and Dr. Tommy Medal recommends 30 day course of doxycyline for chronic non-healing ulcers BLE with persistently elevated  ESR.  CIR was recommended for follow up and he was admitted for progressive therapies.     Hospital Course: Jason [name] was admitted to rehab 02/05/2014 for inpatient therapies to consist of PT, ST and OT at least three hours five days a week. Past admission physiatrist, therapy team and rehab RN have worked together to provide customized collaborative inpatient rehab. Steriod taper was slowly tapered off with blood sugars trending down. He has had tendency to drop to low 40's with subsequent rebounding of BS to  500 range. Dr. Loanne Drilling was contacted for input X one and recommended decreasing lantus to 18 units at bedtime and increasing meal coverage to 5 units. Lantus has been titrated to 30 units at bedtime as patient rebounded to 500 range pm prior to discharge. UA/UCS was ordered due to concerns of infection affecting BS. He was discharged to home on cipro X 7 days as well as short course of diflucan due to yeast in urine. He is to continue Carb counting and use SSI as per home protocol past discharge.  Reglan was schedule daily in am to help with gastroparesis symptoms and po intake has gradually improved. Bowel program was initiated has been ongoing due to neurogenic bowel. Patient and mother were educated on importance of adhering to bowel program to prevent impaction as well as prevent accidents.  Bladder program was discussed with patient and he has elected on indwelling foley for ease of care.   He was educated on importance of pressure relief measures as well as IS to help with secretion management and weak cough. WOC has been following for input on multiple ulcers. Santyl with damp to dry dressing was used on right elbow full thickens wound with good results. This was changed to Aquacel to hep absorb drainage and provide microbial benefits. He completed one month course of doxycyline on 03/02/14. TEDs and binder were ineffective on maintaining BP therefore low dose midodrine was added to help with orthostatic hypotension. Pain control has slowly improved with TPI of trigger point on left trap/supraspinatus area as well as heat and education on posture. Neuropathy with chronic pain was effectively treated with use of MS contin, prn oxycodone as well as Lyrica on tid basis. He has had improvement in functional use of BUE and has progressed to min assist level.    He will continue to receive follow up home health therapies past discharge. [facility] to continue to provide [place], HHOT, HHRN as well as  SW.   Rehab course: During patient's stay in rehab weekly team conferences were held to monitor patient's progress, set goals and discuss barriers to discharge. Patient has had improvement in activity tolerance, strength, balance, postural control, coordination as well as ability to compensate for deficits. He is able to perform bed mobility as well as transfers with min assist and bilateral leg loops. He requires  total assist for  hoyer lift for transfers. He is able to maintain sitting balance at edge of mat for reaching/tossing activities with close supervision to min assist.  He is able to perform grooming tasks as well as eating with supervision past set up. He is able to perform bathing and dressing tasks with min-moderate assist.  His mother has been present for most therapy sessions and is proficient to provide hands on care needed.  Disposition: Home   Diet: Diabetic   Special Instructions: 1. You will need to call Dr. Charm Barges office one week before you run out of pain medication.  You have one month supply. You will need to take in your medication bottles whenever you go in to the office. 2. Call office for refills on pain medications--[phone]. 3. Check blood sugars before meals, 3 am and at bedtime.  4. Need to boost every 20 minutes when in chair or in bed.  5. Protein supplements two to three times a day to help with wound healing.  6. Continue Sliding scale insulin depending on carb intake.  7. Need to continue daily bowel program with suppository and digital stimulation to help with evacuation.      Medication List    STOP taking these medications       doxycycline 100 MG tablet  Commonly known as:  VIBRA-TABS     metoCLOPramide 10 MG/10ML Soln  Commonly known as:  REGLAN  Replaced by:  metoCLOPramide 5 MG tablet     morphine 60 MG 24 hr capsule  Commonly known as:  AVINZA     OxyCODONE 15 mg T12a 12 hr tablet  Commonly known as:  OXYCONTIN  Replaced by:   Oxycodone HCl 10 MG Tabs     oxyCODONE-acetaminophen 10-325 MG per tablet  Commonly known as:  PERCOCET      TAKE these medications       ciprofloxacin 250 MG tablet  Commonly known as:  CIPRO  Take 1 tablet (250 mg total) by mouth 2 (two) times daily.     dexlansoprazole 60 MG capsule  Commonly known as:  DEXILANT  Take 1 capsule (60 mg total) by mouth daily.     fluconazole 100 MG tablet  Commonly known as:  DIFLUCAN  Take 1 tablet (100 mg total) by mouth daily.     insulin aspart 100 UNIT/ML injection  Commonly known as:  novoLOG  Inject 1-17 Units into the skin 4 (four) times daily. Per sliding scale     insulin glargine 100 UNIT/ML injection  Commonly known as:  LANTUS  Inject 0.3 mLs (30 Units total) into the skin daily.     lidocaine 2 % jelly  Commonly known as:  XYLOCAINE  Apply topically 4 (four) times daily as needed. To abrasion prn pain.     lidocaine 5 %  Commonly known as:  LIDODERM  Place on sacral area at 7 am and remove at 7 pm daily. Patch has to be off at least 12 hours in between applications.     loratadine 10 MG tablet  Commonly known as:  CLARITIN  Take 1 tablet (10 mg total) by mouth daily.     methocarbamol 750 MG tablet  Commonly known as:  ROBAXIN  Take 1 tablet (750 mg total) by mouth 4 (four) times daily as needed for muscle spasms.     metoCLOPramide 5 MG tablet  Commonly known as:  REGLAN  Take 1 tablet (5 mg total) by mouth daily with breakfast.     midodrine 5 MG tablet  Commonly known as:  PROAMATINE  Take 1 tablet (5 mg total) by mouth 2 (two) times daily with a meal. To help maintain blood pressure     Morphine Sulfate 40 MG Cp24--Rx # 60 pills   Take 40 mg by mouth every 12 (twelve) hours.     ondansetron 8 MG tablet  Commonly known as:  ZOFRAN  Take 1 tablet (8 mg total) by mouth every 12 (twelve) hours as needed for nausea.     Oxycodone HCl 10 MG Tabs--Rx # 90 pills   Take 0.5-1 tablets (  5-10 mg total) by mouth every  6 (six) hours as needed for severe pain.     pregabalin 75 MG capsule  Commonly known as:  LYRICA  Take 1 capsule (75 mg total) by mouth 3 (three) times daily.     saccharomyces boulardii 250 MG capsule  Commonly known as:  FLORASTOR  Take 1 capsule (250 mg total) by mouth 2 (two) times daily.     silver sulfADIAZINE 1 % cream  Commonly known as:  SILVADENE  Apply topically daily.     simethicone 80 MG chewable tablet  Commonly known as:  MYLICON  Chew 2 tablets (160 mg total) by mouth 4 (four) times daily as needed for flatulence.     ZEGERID OTC 20-1100 MG Caps capsule  Generic drug:  Omeprazole-Sodium Bicarbonate  Take 1 capsule by mouth at bedtime as needed (for heartburn when out of Dexilant).       Follow-up Information   Follow up with Meredith Staggers, MD On 04/02/2014. (Be there at 11 am  for 11:20 appointment)    Specialty:  Physical Medicine and Rehabilitation   Contact information:   [address], [address] [place] [address] [phone]       Follow up with [place]. Call today. (for follow up appointment)    Contact information:   [address] [place] [address] [phone]      Follow up with [name], MD On 03/15/2014. (@ 3:15 pm)    Specialty:  Family Medicine   Contact information:   [place] [address] [phone]       Follow up with Scharlene Gloss, MD On 03/22/2014. (Be there at 3:15 pm )    Specialty:  Infectious Diseases   Contact information:   [address] [place] [address] [phone]       Signed: Bary Leriche 03/08/2014, 5:03 PM

## 2014-03-05 NOTE — Progress Notes (Addendum)
Nursing Note: Pt's fingerstick cbg is 520 . Paged on-call and made aware.wbbObtained order for stat lab for glucose check,ordered and called the lab and made them aware and they stated they would send someone to draw lab.wbb

## 2014-03-05 NOTE — Progress Notes (Signed)
Adult ICU Glycemic Control Treatment Guidelines Sidebar Report  ICU Glycemic Control Protocol Subcutaneous insulin (Phase 1)  1. Monitor CBG every 4 hours 2. Administer correction coverage with insulin aspart (NOVOLOG) subcutaneously every 4 hours according to the sliding scale 3. If CBG < 70 mg/dL use Hypoglycemia Order Set a. In Order Management under Order Sets search for Hypoglycemia Order Set b. Place orders per protocol, cosign required 4. If there is one CBG > 250 mg/dL or two subsequent CBG > 200 mg/dL a. Search for ICU Glycemic Control Order Set (Phase 2) in Order Management under Order Sets and place orders per protocol, cosign required. b. In Order Management select Active Orders and View by Order Set. Discontinue ALL orders from ICU Glycemic Control Order Set (Phase 1) per protocol, cosign required.  ICU Glycemic Control Protocol IV insulin (Phase 2)  1. Monitor CBG every 1 hour 2. Administer regular insulin intravenously per GlucoStabilizer (MDN-CGS) software.   Set high target = 180, low target = 140, multiplier = 0.01   Do NOT use glucose lab values (use CBG readings only)   If CBG reads "Critical High", enter 601   If on TF / TNA and it is discontinued, enter original multiplier 3. If CBG < 70    Follow GlucoStabilizer Instructions   Recheck POCT CBG in 15 minutes   Change multiplier to 0.01 4. Notify MD if   Prompted by GlucoStabilizer:     If insulin drip rate is greater than 30 units/hour, stop insulin drip for 30 minutes and restart GS with original multiplier 0.01   CBG<70 after treating hypoglycemia twice   CBG<250 and there is no IV dextrose, TNA or TF   Withholding insulin for more then 1 hour  5. Initiate Phase III (Transition ICU Glycemic Control Order Set) when all of the following are met:    the insulin infusion rate is < 4 units / hour    tube feeds are at goal rate and stable     there are 6 subsequent CBG readings < 180 mg/dL 6. If patient is on TNA,  await further steps until TNA pharmacist sees patient.    In Order Management under Order Sets search for ICU Glycemic Control Order Set (Phase 3).  Initiate all orders from ICU Glycemic Control Order Set (Phase 2) per protocol, cosign required.   In Order Management select Active Orders and View by Order Set. Discontinue ALL orders form ICU Glycemic Control Order Set (Phase II) per protocol, cosign required.  ICU Glycemic Control Protocol Transition (Phase 3) **Choose appropriate Lantus and tube feed coverage based on last insulin drip rate.  Choose correction Novolog based on patients most recent glomerular filtration rate (GFR).  If GFR less than 30 choose sensitive, and if GFR greater than 30, choose standard. Please verify insulin drip rate/transition doses with 2nd RN.  1. Give first dose of Lantus now and q 24 hours per last insulin drip rate, and discontinue insulin drip 2 hours after Lantus given. 2. Monitor CBG every 4 hours 3. Administer correction insulin (NOVOLOG) [place] according to sliding scale 4. Administer tube feeding coverage (NOVOLOG) as ordered  every 4 hours after tubes feeds are at goal. If patient is not receiving TF, it is not at goal, or coverage is not ordered - skip this step.   5. If tube feeding / TNA is stopped, start D10 infusion at 40 mL/h and hold tube feeding coverage.    6. If there is one CBG reading >   250 mg/dL or two subsequent CBG readings > 200 mg/dL switch to ICU Glycemic Control Order Set (Phase II) a. In Order Management select Active Orders and View by Order Set. Discontinue ALL orders form ICU Glycemic Control Order Set (Phase III) per protocol, cosign required. 7. In Order Management under Order Sets search for ICU Glycemic Control Order Set (Phase II).  Initiate ALL orders form ICU Glycemic Control Order Set (Phase II) per protocol, cosign required. 8.  

## 2014-03-05 NOTE — Progress Notes (Signed)
Adult ICU Glycemic Control Treatment Guidelines Sidebar Report  ICU Glycemic Control Protocol Subcutaneous insulin (Phase 1)  1. Monitor CBG every 4 hours 2. Administer correction coverage with insulin aspart (NOVOLOG) subcutaneously every 4 hours according to the sliding scale 3. If CBG < 70 mg/dL use Hypoglycemia Order Set a. In Order Management under Order Sets search for Hypoglycemia Order Set b. Place orders per protocol, cosign required 4. If there is one CBG > 250 mg/dL or two subsequent CBG > 200 mg/dL a. Search for ICU Glycemic Control Order Set (Phase 2) in Order Management under Order Sets and place orders per protocol, cosign required. b. In Order Management select Active Orders and View by Order Set. Discontinue ALL orders from ICU Glycemic Control Order Set (Phase 1) per protocol, cosign required.  ICU Glycemic Control Protocol IV insulin (Phase 2)  1. Monitor CBG every 1 hour 2. Administer regular insulin intravenously per GlucoStabilizer (MDN-CGS) software.   Set high target = 180, low target = 140, multiplier = 0.01   Do NOT use glucose lab values (use CBG readings only)   If CBG reads "Critical High", enter 601   If on TF / TNA and it is discontinued, enter original multiplier 3. If CBG < 70    Follow GlucoStabilizer Instructions   Recheck POCT CBG in 15 minutes   Change multiplier to 0.01 4. Notify MD if   Prompted by GlucoStabilizer:     If insulin drip rate is greater than 30 units/hour, stop insulin drip for 30 minutes and restart GS with original multiplier 0.01   CBG<70 after treating hypoglycemia twice   CBG<250 and there is no IV dextrose, TNA or TF   Withholding insulin for more then 1 hour  5. Initiate Phase III (Transition ICU Glycemic Control Order Set) when all of the following are met:    the insulin infusion rate is < 4 units / hour    tube feeds are at goal rate and stable     there are 6 subsequent CBG readings < 180 mg/dL 6. If patient is on TNA,  await further steps until TNA pharmacist sees patient.    In Order Management under Order Sets search for ICU Glycemic Control Order Set (Phase 3).  Initiate all orders from ICU Glycemic Control Order Set (Phase 2) per protocol, cosign required.   In Order Management select Active Orders and View by Order Set. Discontinue ALL orders form ICU Glycemic Control Order Set (Phase II) per protocol, cosign required.  ICU Glycemic Control Protocol Transition (Phase 3) **Choose appropriate Lantus and tube feed coverage based on last insulin drip rate.  Choose correction Novolog based on patients most recent glomerular filtration rate (GFR).  If GFR less than 30 choose sensitive, and if GFR greater than 30, choose standard. Please verify insulin drip rate/transition doses with 2nd RN.  1. Give first dose of Lantus now and q 24 hours per last insulin drip rate, and discontinue insulin drip 2 hours after Lantus given. 2. Monitor CBG every 4 hours 3. Administer correction insulin (NOVOLOG) [place] according to sliding scale 4. Administer tube feeding coverage (NOVOLOG) as ordered [place] every 4 hours after tubes feeds are at goal. If patient is not receiving TF, it is not at goal, or coverage is not ordered - skip this step.   5. If tube feeding / TNA is stopped, start D10 infusion at 40 mL/h and hold tube feeding coverage.    6. If there is one CBG reading >   250 mg/dL or two subsequent CBG readings > 200 mg/dL switch to ICU Glycemic Control Order Set (Phase II) a. In Order Management select Active Orders and View by Order Set. Discontinue ALL orders form ICU Glycemic Control Order Set (Phase III) per protocol, cosign required. 7. In Order Management under Order Sets search for ICU Glycemic Control Order Set (Phase II).  Initiate ALL orders form ICU Glycemic Control Order Set (Phase II) per protocol, cosign required. 8.  

## 2014-03-05 NOTE — Progress Notes (Signed)
Nursing Note : At 0221, fingerstick cbg was 510. Obtained order for stat BMET and then an order to give 2 u of novolog SQ.

## 2014-03-05 NOTE — Progress Notes (Signed)
Subjective/Complaints: Low am sugars/lunchtime sugars---->extremely high sugars over night 500+ A 12 point review of systems has been performed and if not noted above is otherwise negative.   Objective: Vital Signs: Blood pressure 123/79, pulse 99, temperature 98.9 F (37.2 C), temperature source Oral, resp. rate 18, weight 58.4 kg (128 lb 12 oz), SpO2 100.00%. No results found. Results for orders placed during the hospital encounter of 02/05/14 (from the past 72 hour(s))  GLUCOSE, CAPILLARY     Status: Abnormal   Collection Time    03/02/14 11:31 AM      Result Value Ref Range   Glucose-Capillary 158 (*) 70 - 99 mg/dL  GLUCOSE, CAPILLARY     Status: None   Collection Time    03/02/14  4:21 PM      Result Value Ref Range   Glucose-Capillary 89  70 - 99 mg/dL  GLUCOSE, CAPILLARY     Status: Abnormal   Collection Time    03/02/14  9:18 PM      Result Value Ref Range   Glucose-Capillary 165 (*) 70 - 99 mg/dL  GLUCOSE, CAPILLARY     Status: Abnormal   Collection Time    03/03/14  2:54 AM      Result Value Ref Range   Glucose-Capillary 234 (*) 70 - 99 mg/dL  BASIC METABOLIC PANEL     Status: Abnormal   Collection Time    03/03/14  5:00 AM      Result Value Ref Range   Sodium 139  137 - 147 mEq/L   Potassium 4.4  3.7 - 5.3 mEq/L   Chloride 104  96 - 112 mEq/L   CO2 24  19 - 32 mEq/L   Glucose, Bld 227 (*) 70 - 99 mg/dL   BUN 37 (*) 6 - 23 mg/dL   Creatinine, Ser 0.83  0.50 - 1.35 mg/dL   Calcium 10.9 (*) 8.4 - 10.5 mg/dL   GFR calc non Af Amer >90  >90 mL/min   GFR calc Af Amer >90  >90 mL/min   Comment: (NOTE)     The eGFR has been calculated using the CKD EPI equation.     This calculation has not been validated in all clinical situations.     eGFR's persistently <90 mL/min signify possible Chronic Kidney     Disease.  CBC WITH DIFFERENTIAL     Status: Abnormal   Collection Time    03/03/14  5:00 AM      Result Value Ref Range   WBC 10.1  4.0 - 10.5 K/uL   RBC  3.31 (*) 4.22 - 5.81 MIL/uL   Hemoglobin 8.2 (*) 13.0 - 17.0 g/dL   Comment: REPEATED TO VERIFY   HCT 27.4 (*) 39.0 - 52.0 %   MCV 82.8  78.0 - 100.0 fL   MCH 24.8 (*) 26.0 - 34.0 pg   MCHC 29.9 (*) 30.0 - 36.0 g/dL   RDW 16.2 (*) 11.5 - 15.5 %   Platelets 605 (*) 150 - 400 K/uL   Comment: REPEATED TO VERIFY   Neutrophils Relative % 44  43 - 77 %   Lymphocytes Relative 37  12 - 46 %   Monocytes Relative 15 (*) 3 - 12 %   Eosinophils Relative 3  0 - 5 %   Basophils Relative 1  0 - 1 %   Neutro Abs 4.5  1.7 - 7.7 K/uL   Lymphs Abs 3.7  0.7 - 4.0 K/uL   Monocytes Absolute  1.5 (*) 0.1 - 1.0 K/uL   Eosinophils Absolute 0.3  0.0 - 0.7 K/uL   Basophils Absolute 0.1  0.0 - 0.1 K/uL   Smear Review PLATELETS APPEAR INCREASED    GLUCOSE, CAPILLARY     Status: Abnormal   Collection Time    03/03/14  7:11 AM      Result Value Ref Range   Glucose-Capillary 215 (*) 70 - 99 mg/dL  GLUCOSE, CAPILLARY     Status: Abnormal   Collection Time    03/03/14 11:33 AM      Result Value Ref Range   Glucose-Capillary 110 (*) 70 - 99 mg/dL  GLUCOSE, CAPILLARY     Status: None   Collection Time    03/03/14  4:56 PM      Result Value Ref Range   Glucose-Capillary 84  70 - 99 mg/dL  GLUCOSE, CAPILLARY     Status: Abnormal   Collection Time    03/03/14  8:29 PM      Result Value Ref Range   Glucose-Capillary 151 (*) 70 - 99 mg/dL  GLUCOSE, CAPILLARY     Status: Abnormal   Collection Time    03/04/14  3:08 AM      Result Value Ref Range   Glucose-Capillary 222 (*) 70 - 99 mg/dL  GLUCOSE, CAPILLARY     Status: Abnormal   Collection Time    03/04/14  7:12 AM      Result Value Ref Range   Glucose-Capillary 162 (*) 70 - 99 mg/dL  GLUCOSE, CAPILLARY     Status: Abnormal   Collection Time    03/04/14 10:58 AM      Result Value Ref Range   Glucose-Capillary 47 (*) 70 - 99 mg/dL   Comment 1 Notify RN    GLUCOSE, CAPILLARY     Status: Abnormal   Collection Time    03/04/14 11:16 AM      Result Value  Ref Range   Glucose-Capillary 42 (*) 70 - 99 mg/dL   Comment 1 Notify RN    GLUCOSE, CAPILLARY     Status: None   Collection Time    03/04/14 11:33 AM      Result Value Ref Range   Glucose-Capillary 91  70 - 99 mg/dL   Comment 1 Notify RN    URINALYSIS, ROUTINE W REFLEX MICROSCOPIC     Status: Abnormal   Collection Time    03/04/14  1:14 PM      Result Value Ref Range   Color, Urine YELLOW  YELLOW   APPearance CLEAR  CLEAR   Specific Gravity, Urine 1.021  1.005 - 1.030   pH 5.0  5.0 - 8.0   Glucose, UA 250 (*) NEGATIVE mg/dL   Hgb urine dipstick MODERATE (*) NEGATIVE   Bilirubin Urine NEGATIVE  NEGATIVE   Ketones, ur NEGATIVE  NEGATIVE mg/dL   Protein, ur 30 (*) NEGATIVE mg/dL   Urobilinogen, UA 0.2  0.0 - 1.0 mg/dL   Nitrite NEGATIVE  NEGATIVE   Leukocytes, UA SMALL (*) NEGATIVE  URINE MICROSCOPIC-ADD ON     Status: Abnormal   Collection Time    03/04/14  1:14 PM      Result Value Ref Range   Squamous Epithelial / LPF RARE  RARE   WBC, UA 7-10  <3 WBC/hpf   RBC / HPF 3-6  <3 RBC/hpf   Bacteria, UA FEW (*) RARE   Urine-Other MANY YEAST    CLOSTRIDIUM DIFFICILE BY PCR  Status: None   Collection Time    03/04/14  1:15 PM      Result Value Ref Range   C difficile by pcr NEGATIVE  NEGATIVE  GLUCOSE, CAPILLARY     Status: Abnormal   Collection Time    03/04/14  4:09 PM      Result Value Ref Range   Glucose-Capillary 417 (*) 70 - 99 mg/dL  GLUCOSE, CAPILLARY     Status: Abnormal   Collection Time    03/04/14  7:14 PM      Result Value Ref Range   Glucose-Capillary 474 (*) 70 - 99 mg/dL   Comment 1 Notify RN    GLUCOSE, CAPILLARY     Status: Abnormal   Collection Time    03/04/14  9:26 PM      Result Value Ref Range   Glucose-Capillary 520 (*) 70 - 99 mg/dL   Comment 1 Notify RN    GLUCOSE, RANDOM     Status: Abnormal   Collection Time    03/04/14 10:03 PM      Result Value Ref Range   Glucose, Bld 495 (*) 70 - 99 mg/dL  GLUCOSE, CAPILLARY     Status: Abnormal    Collection Time    03/04/14 10:17 PM      Result Value Ref Range   Glucose-Capillary 433 (*) 70 - 99 mg/dL   Comment 1 Notify RN    GLUCOSE, CAPILLARY     Status: Abnormal   Collection Time    03/04/14 11:17 PM      Result Value Ref Range   Glucose-Capillary 467 (*) 70 - 99 mg/dL   Comment 1 Notify RN    GLUCOSE, CAPILLARY     Status: Abnormal   Collection Time    03/05/14 12:15 AM      Result Value Ref Range   Glucose-Capillary 460 (*) 70 - 99 mg/dL   Comment 1 Notify RN    GLUCOSE, CAPILLARY     Status: Abnormal   Collection Time    03/05/14  1:19 AM      Result Value Ref Range   Glucose-Capillary 457 (*) 70 - 99 mg/dL   Comment 1 Notify RN    GLUCOSE, CAPILLARY     Status: Abnormal   Collection Time    03/05/14  2:21 AM      Result Value Ref Range   Glucose-Capillary 510 (*) 70 - 99 mg/dL   Comment 1 Notify RN    BASIC METABOLIC PANEL     Status: Abnormal   Collection Time    03/05/14  2:35 AM      Result Value Ref Range   Sodium 132 (*) 137 - 147 mEq/L   Potassium 4.9  3.7 - 5.3 mEq/L   Chloride 96  96 - 112 mEq/L   CO2 24  19 - 32 mEq/L   Glucose, Bld 537 (*) 70 - 99 mg/dL   BUN 46 (*) 6 - 23 mg/dL   Creatinine, Ser 1.02  0.50 - 1.35 mg/dL   Calcium 9.9  8.4 - 10.5 mg/dL   GFR calc non Af Amer >90  >90 mL/min   GFR calc Af Amer >90  >90 mL/min   Comment: (NOTE)     The eGFR has been calculated using the CKD EPI equation.     This calculation has not been validated in all clinical situations.     eGFR's persistently <90 mL/min signify possible Chronic Kidney  Disease.  GLUCOSE, CAPILLARY     Status: Abnormal   Collection Time    03/05/14  3:19 AM      Result Value Ref Range   Glucose-Capillary 497 (*) 70 - 99 mg/dL   Comment 1 Notify RN    GLUCOSE, CAPILLARY     Status: Abnormal   Collection Time    03/05/14  5:37 AM      Result Value Ref Range   Glucose-Capillary 405 (*) 70 - 99 mg/dL   Comment 1 Notify RN    CBC WITH DIFFERENTIAL     Status:  Abnormal   Collection Time    03/05/14  6:52 AM      Result Value Ref Range   WBC 10.9 (*) 4.0 - 10.5 K/uL   RBC 3.16 (*) 4.22 - 5.81 MIL/uL   Hemoglobin 7.9 (*) 13.0 - 17.0 g/dL   Comment: REPEATED TO VERIFY   HCT 25.7 (*) 39.0 - 52.0 %   MCV 81.3  78.0 - 100.0 fL   MCH 25.0 (*) 26.0 - 34.0 pg   MCHC 30.7  30.0 - 36.0 g/dL   RDW 16.1 (*) 11.5 - 15.5 %   Platelets 565 (*) 150 - 400 K/uL   Comment: REPEATED TO VERIFY   Neutrophils Relative % 50  43 - 77 %   Lymphocytes Relative 36  12 - 46 %   Monocytes Relative 13 (*) 3 - 12 %   Eosinophils Relative 1  0 - 5 %   Basophils Relative 0  0 - 1 %   Neutro Abs 5.5  1.7 - 7.7 K/uL   Lymphs Abs 3.9  0.7 - 4.0 K/uL   Monocytes Absolute 1.4 (*) 0.1 - 1.0 K/uL   Eosinophils Absolute 0.1  0.0 - 0.7 K/uL   Basophils Absolute 0.0  0.0 - 0.1 K/uL   WBC Morphology ATYPICAL LYMPHOCYTES    GLUCOSE, CAPILLARY     Status: Abnormal   Collection Time    03/05/14  7:43 AM      Result Value Ref Range   Glucose-Capillary 336 (*) 70 - 99 mg/dL     HEENT: normal Cardio: RRR and no murmur Resp: CTA B/L and unlabored GI: BS positive and mildly distended and tympanitic, mild tenderness Extremity:  No Edema Skin: legs improved. Elbow granulating nicely. Tiny stage 2 at sacrum Neuro: Alert/Oriented, Abnormal Sensory absent in feet and Abnormal Motor 0/5 in LE,0/5 finger extension and 0/5 hand intrinsic. Wrist extension 2-, 3/5 tricep   2, biceps 5/5. Decreased sensation below elbows bilaterally but able to ID LT in Left C6-7 dermatomes. ?trace HF on right. Musc/Skel:  Generalized upper thoracic spine tenderness.  Gen NAD   Assessment/Plan: 1. 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 Physiatrist is providing close team supervision and 24 hour management of active medical problems listed below. Physiatrist and rehab team continue to assess  barriers to discharge/monitor patient progress toward functional and medical goals.   FIM: FIM - Bathing Bathing Steps Patient Completed: Chest;Right Arm;Left Arm;Abdomen;Front perineal area Bathing: 3: Mod-Patient completes 5-7 91f10 parts or 50-74% (Per pt and family report)  FIM - Upper Body Dressing/Undressing Upper body dressing/undressing steps patient completed: Thread/unthread right sleeve of pullover shirt/dresss;Thread/unthread left sleeve of pullover shirt/dress;Put head through opening of pull over shirt/dress;Pull shirt over trunk Upper body dressing/undressing: 5: Supervision: Safety issues/verbal cues (Per pt/family report) FIM - Lower Body Dressing/Undressing Lower  body dressing/undressing steps patient completed: Thread/unthread right pants leg;Thread/unthread left pants leg Lower body dressing/undressing: 3: Mod-Patient completed 50-74% of tasks (Per pt/family report)  FIM - Toileting Toileting: 1: Total-Patient completed zero steps, helper did all 3  FIM - Radio producer Devices: Bedside commode (padded tub bench with cut out) Toilet Transfers: 0-Activity did not occur  FIM - Control and instrumentation engineer Devices: Arm rests;HOB elevated;Bed rails;Sliding board Bed/Chair Transfer: 1: Mechanical lift (Pt mother demonstrated mod I Hoyer lift for transfer bed to w/c Simultaneous filing. User may not have seen previous data.)  FIM - Locomotion: Wheelchair Distance: 25 Locomotion: Wheelchair: 6: Travels 150 ft or more, turns around, maneuvers to table, bed or toilet, negotiates 3% grade: maneuvers on rugs and over door sills independently FIM - Locomotion: Ambulation Locomotion: Ambulation: 0: Activity did not occur  Comprehension Comprehension Mode: Auditory Comprehension: 6-Follows complex conversation/direction: With extra time/assistive device  Expression Expression Mode: Verbal Expression: 6-Expresses complex  ideas: With extra time/assistive device  Social Interaction Social Interaction: 6-Interacts appropriately with others with medication or extra time (anti-anxiety, antidepressant).  Problem Solving Problem Solving: 6-Solves complex problems: With extra time  Memory Memory: 6-More than reasonable amt of time  Medical Problem List and Plan:  1. 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 DVT Prophylaxis/Anticoagulation: Pharmaceutical: Lovenox  3. Chronic Pain Management: increase MS contin to 45 mg bid. Air bed for back and buttock pain as well as pressure relief measures.      -reminded him to use heating pad for shoulder/neck symptoms, good posture when in bed and chair  -continues robaxin for muscle spasms.   -his clavicle fracture also in this general area with distraction laterally---denies any pain in this area.  -lyrica 6m bid for neuropathic pain 4. Mood: reviewed anxiety with patient. Discussed triggers and ways to de-escalate. Also have added prn xanax if he is unable to manage it. 5. Neuropsych: This patient is capable of making decisions on his own behalf.  6. Spinal cord infarct: steroids off-   7. DM type 2 with gastroparesis: increased lantus to 25 units qhs (gradually increase as needed) . increase novolog to 8u units tid ac  -hold on sliding scale at present due to volatile readings.    -appreciate Dr. ECordelia Penone-time consultation  -sugars may be showing some stabilization 8. ChronicBLE wounds with chronically elevated ESR: continue doxycline for 30 days (started 02/02/14). Follow up with ID after discharge. Continue local wound care.   -right elbow granulating in  -wet-dry with allevyn over right popliteal area 9. Constipation/diabetic gastroparesis/neurogenic bowel: reglan causing cramping--dc . Bowel program - D/C miralx, cont colace and bisacodyl supp 10. Severe diabetic peripheral  neuropathy:resume neurontin 1019mbid an 40020mqhs   11. Pulmonary:  deep breathing, IS/FV, CPAP -quad cough, assistive techniques  -keep hob at 30 degrees  -chest PT 12. Low grade temp:   -labs ok ,cxr ok  -IS, observation 13. CV---increased midodrine to 5mg47montinue binder/teds   LOS (Days) 28 A FACE TO FACE EVALUATION WAS PERFORMED  ZachMeredith Staggers9/2015, 8:39 AM      Subjective/Complaints: Still having upper back pain.  A 12 point review of systems has been performed and if not noted above is otherwise negative.   Objective: Vital Signs: Blood pressure 123/79, pulse 99, temperature 98.9 F (37.2 C), temperature source Oral, resp. rate 18, weight 58.4 kg (128 lb 12 oz), SpO2 100.00%. No  results found. Results for orders placed during the hospital encounter of 02/05/14 (from the past 72 hour(s))  GLUCOSE, CAPILLARY     Status: Abnormal   Collection Time    03/02/14 11:31 AM      Result Value Ref Range   Glucose-Capillary 158 (*) 70 - 99 mg/dL  GLUCOSE, CAPILLARY     Status: None   Collection Time    03/02/14  4:21 PM      Result Value Ref Range   Glucose-Capillary 89  70 - 99 mg/dL  GLUCOSE, CAPILLARY     Status: Abnormal   Collection Time    03/02/14  9:18 PM      Result Value Ref Range   Glucose-Capillary 165 (*) 70 - 99 mg/dL  GLUCOSE, CAPILLARY     Status: Abnormal   Collection Time    03/03/14  2:54 AM      Result Value Ref Range   Glucose-Capillary 234 (*) 70 - 99 mg/dL  BASIC METABOLIC PANEL     Status: Abnormal   Collection Time    03/03/14  5:00 AM      Result Value Ref Range   Sodium 139  137 - 147 mEq/L   Potassium 4.4  3.7 - 5.3 mEq/L   Chloride 104  96 - 112 mEq/L   CO2 24  19 - 32 mEq/L   Glucose, Bld 227 (*) 70 - 99 mg/dL   BUN 37 (*) 6 - 23 mg/dL   Creatinine, Ser 0.83  0.50 - 1.35 mg/dL   Calcium 10.9 (*) 8.4 - 10.5 mg/dL   GFR calc non Af Amer >90  >90 mL/min   GFR calc Af Amer >90  >90 mL/min   Comment: (NOTE)     The eGFR  has been calculated using the CKD EPI equation.     This calculation has not been validated in all clinical situations.     eGFR's persistently <90 mL/min signify possible Chronic Kidney     Disease.  CBC WITH DIFFERENTIAL     Status: Abnormal   Collection Time    03/03/14  5:00 AM      Result Value Ref Range   WBC 10.1  4.0 - 10.5 K/uL   RBC 3.31 (*) 4.22 - 5.81 MIL/uL   Hemoglobin 8.2 (*) 13.0 - 17.0 g/dL   Comment: REPEATED TO VERIFY   HCT 27.4 (*) 39.0 - 52.0 %   MCV 82.8  78.0 - 100.0 fL   MCH 24.8 (*) 26.0 - 34.0 pg   MCHC 29.9 (*) 30.0 - 36.0 g/dL   RDW 16.2 (*) 11.5 - 15.5 %   Platelets 605 (*) 150 - 400 K/uL   Comment: REPEATED TO VERIFY   Neutrophils Relative % 44  43 - 77 %   Lymphocytes Relative 37  12 - 46 %   Monocytes Relative 15 (*) 3 - 12 %   Eosinophils Relative 3  0 - 5 %   Basophils Relative 1  0 - 1 %   Neutro Abs 4.5  1.7 - 7.7 K/uL   Lymphs Abs 3.7  0.7 - 4.0 K/uL   Monocytes Absolute 1.5 (*) 0.1 - 1.0 K/uL   Eosinophils Absolute 0.3  0.0 - 0.7 K/uL   Basophils Absolute 0.1  0.0 - 0.1 K/uL   Smear Review PLATELETS APPEAR INCREASED    GLUCOSE, CAPILLARY     Status: Abnormal   Collection Time    03/03/14  7:11 AM  Result Value Ref Range   Glucose-Capillary 215 (*) 70 - 99 mg/dL  GLUCOSE, CAPILLARY     Status: Abnormal   Collection Time    03/03/14 11:33 AM      Result Value Ref Range   Glucose-Capillary 110 (*) 70 - 99 mg/dL  GLUCOSE, CAPILLARY     Status: None   Collection Time    03/03/14  4:56 PM      Result Value Ref Range   Glucose-Capillary 84  70 - 99 mg/dL  GLUCOSE, CAPILLARY     Status: Abnormal   Collection Time    03/03/14  8:29 PM      Result Value Ref Range   Glucose-Capillary 151 (*) 70 - 99 mg/dL  GLUCOSE, CAPILLARY     Status: Abnormal   Collection Time    03/04/14  3:08 AM      Result Value Ref Range   Glucose-Capillary 222 (*) 70 - 99 mg/dL  GLUCOSE, CAPILLARY     Status: Abnormal   Collection Time    03/04/14  7:12  AM      Result Value Ref Range   Glucose-Capillary 162 (*) 70 - 99 mg/dL  GLUCOSE, CAPILLARY     Status: Abnormal   Collection Time    03/04/14 10:58 AM      Result Value Ref Range   Glucose-Capillary 47 (*) 70 - 99 mg/dL   Comment 1 Notify RN    GLUCOSE, CAPILLARY     Status: Abnormal   Collection Time    03/04/14 11:16 AM      Result Value Ref Range   Glucose-Capillary 42 (*) 70 - 99 mg/dL   Comment 1 Notify RN    GLUCOSE, CAPILLARY     Status: None   Collection Time    03/04/14 11:33 AM      Result Value Ref Range   Glucose-Capillary 91  70 - 99 mg/dL   Comment 1 Notify RN    URINALYSIS, ROUTINE W REFLEX MICROSCOPIC     Status: Abnormal   Collection Time    03/04/14  1:14 PM      Result Value Ref Range   Color, Urine YELLOW  YELLOW   APPearance CLEAR  CLEAR   Specific Gravity, Urine 1.021  1.005 - 1.030   pH 5.0  5.0 - 8.0   Glucose, UA 250 (*) NEGATIVE mg/dL   Hgb urine dipstick MODERATE (*) NEGATIVE   Bilirubin Urine NEGATIVE  NEGATIVE   Ketones, ur NEGATIVE  NEGATIVE mg/dL   Protein, ur 30 (*) NEGATIVE mg/dL   Urobilinogen, UA 0.2  0.0 - 1.0 mg/dL   Nitrite NEGATIVE  NEGATIVE   Leukocytes, UA SMALL (*) NEGATIVE  URINE MICROSCOPIC-ADD ON     Status: Abnormal   Collection Time    03/04/14  1:14 PM      Result Value Ref Range   Squamous Epithelial / LPF RARE  RARE   WBC, UA 7-10  <3 WBC/hpf   RBC / HPF 3-6  <3 RBC/hpf   Bacteria, UA FEW (*) RARE   Urine-Other MANY YEAST    CLOSTRIDIUM DIFFICILE BY PCR     Status: None   Collection Time    03/04/14  1:15 PM      Result Value Ref Range   C difficile by pcr NEGATIVE  NEGATIVE  GLUCOSE, CAPILLARY     Status: Abnormal   Collection Time    03/04/14  4:09 PM      Result Value  Ref Range   Glucose-Capillary 417 (*) 70 - 99 mg/dL  GLUCOSE, CAPILLARY     Status: Abnormal   Collection Time    03/04/14  7:14 PM      Result Value Ref Range   Glucose-Capillary 474 (*) 70 - 99 mg/dL   Comment 1 Notify RN    GLUCOSE,  CAPILLARY     Status: Abnormal   Collection Time    03/04/14  9:26 PM      Result Value Ref Range   Glucose-Capillary 520 (*) 70 - 99 mg/dL   Comment 1 Notify RN    GLUCOSE, RANDOM     Status: Abnormal   Collection Time    03/04/14 10:03 PM      Result Value Ref Range   Glucose, Bld 495 (*) 70 - 99 mg/dL  GLUCOSE, CAPILLARY     Status: Abnormal   Collection Time    03/04/14 10:17 PM      Result Value Ref Range   Glucose-Capillary 433 (*) 70 - 99 mg/dL   Comment 1 Notify RN    GLUCOSE, CAPILLARY     Status: Abnormal   Collection Time    03/04/14 11:17 PM      Result Value Ref Range   Glucose-Capillary 467 (*) 70 - 99 mg/dL   Comment 1 Notify RN    GLUCOSE, CAPILLARY     Status: Abnormal   Collection Time    03/05/14 12:15 AM      Result Value Ref Range   Glucose-Capillary 460 (*) 70 - 99 mg/dL   Comment 1 Notify RN    GLUCOSE, CAPILLARY     Status: Abnormal   Collection Time    03/05/14  1:19 AM      Result Value Ref Range   Glucose-Capillary 457 (*) 70 - 99 mg/dL   Comment 1 Notify RN    GLUCOSE, CAPILLARY     Status: Abnormal   Collection Time    03/05/14  2:21 AM      Result Value Ref Range   Glucose-Capillary 510 (*) 70 - 99 mg/dL   Comment 1 Notify RN    BASIC METABOLIC PANEL     Status: Abnormal   Collection Time    03/05/14  2:35 AM      Result Value Ref Range   Sodium 132 (*) 137 - 147 mEq/L   Potassium 4.9  3.7 - 5.3 mEq/L   Chloride 96  96 - 112 mEq/L   CO2 24  19 - 32 mEq/L   Glucose, Bld 537 (*) 70 - 99 mg/dL   BUN 46 (*) 6 - 23 mg/dL   Creatinine, Ser 1.02  0.50 - 1.35 mg/dL   Calcium 9.9  8.4 - 10.5 mg/dL   GFR calc non Af Amer >90  >90 mL/min   GFR calc Af Amer >90  >90 mL/min   Comment: (NOTE)     The eGFR has been calculated using the CKD EPI equation.     This calculation has not been validated in all clinical situations.     eGFR's persistently <90 mL/min signify possible Chronic Kidney     Disease.  GLUCOSE, CAPILLARY     Status: Abnormal    Collection Time    03/05/14  3:19 AM      Result Value Ref Range   Glucose-Capillary 497 (*) 70 - 99 mg/dL   Comment 1 Notify RN    GLUCOSE, CAPILLARY     Status: Abnormal  Collection Time    03/05/14  5:37 AM      Result Value Ref Range   Glucose-Capillary 405 (*) 70 - 99 mg/dL   Comment 1 Notify RN    CBC WITH DIFFERENTIAL     Status: Abnormal   Collection Time    03/05/14  6:52 AM      Result Value Ref Range   WBC 10.9 (*) 4.0 - 10.5 K/uL   RBC 3.16 (*) 4.22 - 5.81 MIL/uL   Hemoglobin 7.9 (*) 13.0 - 17.0 g/dL   Comment: REPEATED TO VERIFY   HCT 25.7 (*) 39.0 - 52.0 %   MCV 81.3  78.0 - 100.0 fL   MCH 25.0 (*) 26.0 - 34.0 pg   MCHC 30.7  30.0 - 36.0 g/dL   RDW 16.1 (*) 11.5 - 15.5 %   Platelets 565 (*) 150 - 400 K/uL   Comment: REPEATED TO VERIFY   Neutrophils Relative % 50  43 - 77 %   Lymphocytes Relative 36  12 - 46 %   Monocytes Relative 13 (*) 3 - 12 %   Eosinophils Relative 1  0 - 5 %   Basophils Relative 0  0 - 1 %   Neutro Abs 5.5  1.7 - 7.7 K/uL   Lymphs Abs 3.9  0.7 - 4.0 K/uL   Monocytes Absolute 1.4 (*) 0.1 - 1.0 K/uL   Eosinophils Absolute 0.1  0.0 - 0.7 K/uL   Basophils Absolute 0.0  0.0 - 0.1 K/uL   WBC Morphology ATYPICAL LYMPHOCYTES    GLUCOSE, CAPILLARY     Status: Abnormal   Collection Time    03/05/14  7:43 AM      Result Value Ref Range   Glucose-Capillary 336 (*) 70 - 99 mg/dL     HEENT: normal Cardio: RRR and no murmur Resp: CTA B/L and unlabored GI: BS positive and mildly distended and tympanitic, mild tenderness Extremity:  No Edema Skin:  . Neuro: Alert/Oriented, Abnormal Sensory absent in feet and Abnormal Motor 0/5 in LE,0/5 finger extension and 0/5 hand intrinsic. Wrist extension 2-, 3/5 tricep   2, biceps 5/5. Decreased sensation below elbows bilaterally but able to ID LT in Left C6-7 dermatomes. trace HF right. Musc/Skel:  Generalized upper thoracic spine tenderness.  Gen NAD   Assessment/Plan: 1. Functional deficits  secondary to tetraparesis which require 3+ hours per day of interdisciplinary therapy in a comprehensive inpatient rehab setting. Physiatrist is providing close team supervision and 24 hour management of active medical problems listed below. Physiatrist and rehab team continue to assess barriers to discharge/monitor patient progress toward functional and medical goals.  Will hold dc for now pending labs and cbg's.    FIM: FIM - Bathing Bathing Steps Patient Completed: Chest;Right Arm;Left Arm;Abdomen;Front perineal area Bathing: 3: Mod-Patient completes 5-7 39f10 parts or 50-74% (Per pt and family report)  FIM - Upper Body Dressing/Undressing Upper body dressing/undressing steps patient completed: Thread/unthread right sleeve of pullover shirt/dresss;Thread/unthread left sleeve of pullover shirt/dress;Put head through opening of pull over shirt/dress;Pull shirt over trunk Upper body dressing/undressing: 5: Supervision: Safety issues/verbal cues (Per pt/family report) FIM - Lower Body Dressing/Undressing Lower body dressing/undressing steps patient completed: Thread/unthread right pants leg;Thread/unthread left pants leg Lower body dressing/undressing: 3: Mod-Patient completed 50-74% of tasks (Per pt/family report)  FIM - Toileting Toileting: 1: Total-Patient completed zero steps, helper did all 3  FIM - TRadio producerDevices: Bedside commode (padded tub bench with cut out) Toilet Transfers:  0-Activity did not occur  FIM - Control and instrumentation engineer Devices: Arm rests;HOB elevated;Bed rails;Sliding board Bed/Chair Transfer: 1: Mechanical lift (Pt mother demonstrated mod I Hoyer lift for transfer bed to w/c Simultaneous filing. User may not have seen previous data.)  FIM - Locomotion: Wheelchair Distance: 25 Locomotion: Wheelchair: 6: Travels 150 ft or more, turns around, maneuvers to table, bed or toilet, negotiates 3% grade: maneuvers  on rugs and over door sills independently FIM - Locomotion: Ambulation Locomotion: Ambulation: 0: Activity did not occur  Comprehension Comprehension Mode: Auditory Comprehension: 6-Follows complex conversation/direction: With extra time/assistive device  Expression Expression Mode: Verbal Expression: 6-Expresses complex ideas: With extra time/assistive device  Social Interaction Social Interaction: 6-Interacts appropriately with others with medication or extra time (anti-anxiety, antidepressant).  Problem Solving Problem Solving: 6-Solves complex problems: With extra time  Memory Memory: 6-More than reasonable amt of time  Medical Problem List and Plan:  1. 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 DVT Prophylaxis/Anticoagulation: Pharmaceutical: Lovenox  3. Chronic Pain Management: increase MS contin to 45 mg bid. Air bed for back and buttock pain as well as pressure relief measures.      -reminded him to use heating pad for shoulder/neck symptoms, good posture when in bed and chair  -continues robaxin. TPI x5 performed yesterday with good results to left periscapular area and traps  -his clavicle fracture also in this general area with distraction laterally---denies any pain in this area.  -lyrica 45m bid for neuropathic pain---increase to TID 4. Mood: reviewed anxiety with patient. Discussed triggers and ways to de-escalate. Also have added prn xanax if he is unable to manage it. 5. Neuropsych: This patient is capable of making decisions on his own behalf.  6. Spinal cord infarct: steroids off-   7. DM type 2 with gastroparesis: increase lantus to 30u qhs . increased novolog to 8u units tid ac yesterday  -hold on sliding scale at present due to volatile readings.    -appreciate Dr. ECordelia Penone-time consultation 8. ChronicBLE wounds with chronically elevated ESR: continue doxycline for 30 days (started  02/02/14). Follow up with ID after discharge. Continue local wound care.   -right elbow granulating in  -wet-dry with allevyn over right popliteal area 9. Constipation/diabetic gastroparesis/neurogenic bowel: reglan causing cramping--dc . Bowel program - D/C miralx, cont colace and bisacodyl supp 10. Severe diabetic peripheral neuropathy:resume neurontin 1090mbid an 4002mqhs   11. Pulmonary:  deep breathing, IS/FV, CPAP -quad cough, assistive techniques  -keep hob at 30 degrees  -chest PT  12. Low grade temp:   -UA suspicious  -IS, observation  -diflucan for yeast in urine  -consider cipro to cover urine as well (culture pending) 13. CV---increased midodrine to 5mg76mLOS (Days) 28 A FACE TO FACE EVALUATION WAS PERFORMED  ZachMeredith Staggers9/2015, 8:39 AM      Subjective/Complaints: Hand pain worse. Had "bulging" in his belly yesterday A 12 point review of systems has been performed and if not noted above is otherwise negative.   Objective: Vital Signs: Blood pressure 123/79, pulse 99, temperature 98.9 F (37.2 C), temperature source Oral, resp. rate 18, weight 58.4 kg (128 lb 12 oz), SpO2 100.00%. No results found. Results for orders placed during the hospital encounter of 02/05/14 (from the past 72 hour(s))  GLUCOSE, CAPILLARY     Status: Abnormal   Collection Time    03/02/14 11:31 AM  Result Value Ref Range   Glucose-Capillary 158 (*) 70 - 99 mg/dL  GLUCOSE, CAPILLARY     Status: None   Collection Time    03/02/14  4:21 PM      Result Value Ref Range   Glucose-Capillary 89  70 - 99 mg/dL  GLUCOSE, CAPILLARY     Status: Abnormal   Collection Time    03/02/14  9:18 PM      Result Value Ref Range   Glucose-Capillary 165 (*) 70 - 99 mg/dL  GLUCOSE, CAPILLARY     Status: Abnormal   Collection Time    03/03/14  2:54 AM      Result Value Ref Range   Glucose-Capillary 234 (*) 70 - 99 mg/dL  BASIC METABOLIC PANEL     Status: Abnormal   Collection Time     03/03/14  5:00 AM      Result Value Ref Range   Sodium 139  137 - 147 mEq/L   Potassium 4.4  3.7 - 5.3 mEq/L   Chloride 104  96 - 112 mEq/L   CO2 24  19 - 32 mEq/L   Glucose, Bld 227 (*) 70 - 99 mg/dL   BUN 37 (*) 6 - 23 mg/dL   Creatinine, Ser 0.83  0.50 - 1.35 mg/dL   Calcium 10.9 (*) 8.4 - 10.5 mg/dL   GFR calc non Af Amer >90  >90 mL/min   GFR calc Af Amer >90  >90 mL/min   Comment: (NOTE)     The eGFR has been calculated using the CKD EPI equation.     This calculation has not been validated in all clinical situations.     eGFR's persistently <90 mL/min signify possible Chronic Kidney     Disease.  CBC WITH DIFFERENTIAL     Status: Abnormal   Collection Time    03/03/14  5:00 AM      Result Value Ref Range   WBC 10.1  4.0 - 10.5 K/uL   RBC 3.31 (*) 4.22 - 5.81 MIL/uL   Hemoglobin 8.2 (*) 13.0 - 17.0 g/dL   Comment: REPEATED TO VERIFY   HCT 27.4 (*) 39.0 - 52.0 %   MCV 82.8  78.0 - 100.0 fL   MCH 24.8 (*) 26.0 - 34.0 pg   MCHC 29.9 (*) 30.0 - 36.0 g/dL   RDW 16.2 (*) 11.5 - 15.5 %   Platelets 605 (*) 150 - 400 K/uL   Comment: REPEATED TO VERIFY   Neutrophils Relative % 44  43 - 77 %   Lymphocytes Relative 37  12 - 46 %   Monocytes Relative 15 (*) 3 - 12 %   Eosinophils Relative 3  0 - 5 %   Basophils Relative 1  0 - 1 %   Neutro Abs 4.5  1.7 - 7.7 K/uL   Lymphs Abs 3.7  0.7 - 4.0 K/uL   Monocytes Absolute 1.5 (*) 0.1 - 1.0 K/uL   Eosinophils Absolute 0.3  0.0 - 0.7 K/uL   Basophils Absolute 0.1  0.0 - 0.1 K/uL   Smear Review PLATELETS APPEAR INCREASED    GLUCOSE, CAPILLARY     Status: Abnormal   Collection Time    03/03/14  7:11 AM      Result Value Ref Range   Glucose-Capillary 215 (*) 70 - 99 mg/dL  GLUCOSE, CAPILLARY     Status: Abnormal   Collection Time    03/03/14 11:33 AM      Result Value Ref  Range   Glucose-Capillary 110 (*) 70 - 99 mg/dL  GLUCOSE, CAPILLARY     Status: None   Collection Time    03/03/14  4:56 PM      Result Value Ref Range    Glucose-Capillary 84  70 - 99 mg/dL  GLUCOSE, CAPILLARY     Status: Abnormal   Collection Time    03/03/14  8:29 PM      Result Value Ref Range   Glucose-Capillary 151 (*) 70 - 99 mg/dL  GLUCOSE, CAPILLARY     Status: Abnormal   Collection Time    03/04/14  3:08 AM      Result Value Ref Range   Glucose-Capillary 222 (*) 70 - 99 mg/dL  GLUCOSE, CAPILLARY     Status: Abnormal   Collection Time    03/04/14  7:12 AM      Result Value Ref Range   Glucose-Capillary 162 (*) 70 - 99 mg/dL  GLUCOSE, CAPILLARY     Status: Abnormal   Collection Time    03/04/14 10:58 AM      Result Value Ref Range   Glucose-Capillary 47 (*) 70 - 99 mg/dL   Comment 1 Notify RN    GLUCOSE, CAPILLARY     Status: Abnormal   Collection Time    03/04/14 11:16 AM      Result Value Ref Range   Glucose-Capillary 42 (*) 70 - 99 mg/dL   Comment 1 Notify RN    GLUCOSE, CAPILLARY     Status: None   Collection Time    03/04/14 11:33 AM      Result Value Ref Range   Glucose-Capillary 91  70 - 99 mg/dL   Comment 1 Notify RN    URINALYSIS, ROUTINE W REFLEX MICROSCOPIC     Status: Abnormal   Collection Time    03/04/14  1:14 PM      Result Value Ref Range   Color, Urine YELLOW  YELLOW   APPearance CLEAR  CLEAR   Specific Gravity, Urine 1.021  1.005 - 1.030   pH 5.0  5.0 - 8.0   Glucose, UA 250 (*) NEGATIVE mg/dL   Hgb urine dipstick MODERATE (*) NEGATIVE   Bilirubin Urine NEGATIVE  NEGATIVE   Ketones, ur NEGATIVE  NEGATIVE mg/dL   Protein, ur 30 (*) NEGATIVE mg/dL   Urobilinogen, UA 0.2  0.0 - 1.0 mg/dL   Nitrite NEGATIVE  NEGATIVE   Leukocytes, UA SMALL (*) NEGATIVE  URINE MICROSCOPIC-ADD ON     Status: Abnormal   Collection Time    03/04/14  1:14 PM      Result Value Ref Range   Squamous Epithelial / LPF RARE  RARE   WBC, UA 7-10  <3 WBC/hpf   RBC / HPF 3-6  <3 RBC/hpf   Bacteria, UA FEW (*) RARE   Urine-Other MANY YEAST    CLOSTRIDIUM DIFFICILE BY PCR     Status: None   Collection Time    03/04/14   1:15 PM      Result Value Ref Range   C difficile by pcr NEGATIVE  NEGATIVE  GLUCOSE, CAPILLARY     Status: Abnormal   Collection Time    03/04/14  4:09 PM      Result Value Ref Range   Glucose-Capillary 417 (*) 70 - 99 mg/dL  GLUCOSE, CAPILLARY     Status: Abnormal   Collection Time    03/04/14  7:14 PM      Result Value Ref  Range   Glucose-Capillary 474 (*) 70 - 99 mg/dL   Comment 1 Notify RN    GLUCOSE, CAPILLARY     Status: Abnormal   Collection Time    03/04/14  9:26 PM      Result Value Ref Range   Glucose-Capillary 520 (*) 70 - 99 mg/dL   Comment 1 Notify RN    GLUCOSE, RANDOM     Status: Abnormal   Collection Time    03/04/14 10:03 PM      Result Value Ref Range   Glucose, Bld 495 (*) 70 - 99 mg/dL  GLUCOSE, CAPILLARY     Status: Abnormal   Collection Time    03/04/14 10:17 PM      Result Value Ref Range   Glucose-Capillary 433 (*) 70 - 99 mg/dL   Comment 1 Notify RN    GLUCOSE, CAPILLARY     Status: Abnormal   Collection Time    03/04/14 11:17 PM      Result Value Ref Range   Glucose-Capillary 467 (*) 70 - 99 mg/dL   Comment 1 Notify RN    GLUCOSE, CAPILLARY     Status: Abnormal   Collection Time    03/05/14 12:15 AM      Result Value Ref Range   Glucose-Capillary 460 (*) 70 - 99 mg/dL   Comment 1 Notify RN    GLUCOSE, CAPILLARY     Status: Abnormal   Collection Time    03/05/14  1:19 AM      Result Value Ref Range   Glucose-Capillary 457 (*) 70 - 99 mg/dL   Comment 1 Notify RN    GLUCOSE, CAPILLARY     Status: Abnormal   Collection Time    03/05/14  2:21 AM      Result Value Ref Range   Glucose-Capillary 510 (*) 70 - 99 mg/dL   Comment 1 Notify RN    BASIC METABOLIC PANEL     Status: Abnormal   Collection Time    03/05/14  2:35 AM      Result Value Ref Range   Sodium 132 (*) 137 - 147 mEq/L   Potassium 4.9  3.7 - 5.3 mEq/L   Chloride 96  96 - 112 mEq/L   CO2 24  19 - 32 mEq/L   Glucose, Bld 537 (*) 70 - 99 mg/dL   BUN 46 (*) 6 - 23 mg/dL    Creatinine, Ser 1.02  0.50 - 1.35 mg/dL   Calcium 9.9  8.4 - 10.5 mg/dL   GFR calc non Af Amer >90  >90 mL/min   GFR calc Af Amer >90  >90 mL/min   Comment: (NOTE)     The eGFR has been calculated using the CKD EPI equation.     This calculation has not been validated in all clinical situations.     eGFR's persistently <90 mL/min signify possible Chronic Kidney     Disease.  GLUCOSE, CAPILLARY     Status: Abnormal   Collection Time    03/05/14  3:19 AM      Result Value Ref Range   Glucose-Capillary 497 (*) 70 - 99 mg/dL   Comment 1 Notify RN    GLUCOSE, CAPILLARY     Status: Abnormal   Collection Time    03/05/14  5:37 AM      Result Value Ref Range   Glucose-Capillary 405 (*) 70 - 99 mg/dL   Comment 1 Notify RN    CBC WITH DIFFERENTIAL  Status: Abnormal   Collection Time    03/05/14  6:52 AM      Result Value Ref Range   WBC 10.9 (*) 4.0 - 10.5 K/uL   RBC 3.16 (*) 4.22 - 5.81 MIL/uL   Hemoglobin 7.9 (*) 13.0 - 17.0 g/dL   Comment: REPEATED TO VERIFY   HCT 25.7 (*) 39.0 - 52.0 %   MCV 81.3  78.0 - 100.0 fL   MCH 25.0 (*) 26.0 - 34.0 pg   MCHC 30.7  30.0 - 36.0 g/dL   RDW 16.1 (*) 11.5 - 15.5 %   Platelets 565 (*) 150 - 400 K/uL   Comment: REPEATED TO VERIFY   Neutrophils Relative % 50  43 - 77 %   Lymphocytes Relative 36  12 - 46 %   Monocytes Relative 13 (*) 3 - 12 %   Eosinophils Relative 1  0 - 5 %   Basophils Relative 0  0 - 1 %   Neutro Abs 5.5  1.7 - 7.7 K/uL   Lymphs Abs 3.9  0.7 - 4.0 K/uL   Monocytes Absolute 1.4 (*) 0.1 - 1.0 K/uL   Eosinophils Absolute 0.1  0.0 - 0.7 K/uL   Basophils Absolute 0.0  0.0 - 0.1 K/uL   WBC Morphology ATYPICAL LYMPHOCYTES    GLUCOSE, CAPILLARY     Status: Abnormal   Collection Time    03/05/14  7:43 AM      Result Value Ref Range   Glucose-Capillary 336 (*) 70 - 99 mg/dL     HEENT: normal Cardio: RRR and no murmur Resp: CTA B/L and unlabored GI: BS positive and mildly distended and tympanitic, mild  tenderness Extremity:  No Edema Skin:  . Neuro: Alert/Oriented, Abnormal Sensory absent in feet and Abnormal Motor 0/5 in LE,0/5 finger extension and 0/5 hand intrinsic. Wrist extension 2-, 3/5 tricep   2, biceps 5/5. Decreased sensation below elbows bilaterally but able to ID LT in Left C6-7 dermatomes. ?trace HF on right. Musc/Skel:  Generalized upper thoracic spine tenderness.  Gen NAD   Assessment/Plan: 1. 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 Physiatrist is providing close team supervision and 24 hour management of active medical problems listed below. Physiatrist and rehab team continue to assess barriers to discharge/monitor patient progress toward functional and medical goals.   FIM: FIM - Bathing Bathing Steps Patient Completed: Chest;Right Arm;Left Arm;Abdomen;Front perineal area Bathing: 3: Mod-Patient completes 5-7 29f10 parts or 50-74% (Per pt and family report)  FIM - Upper Body Dressing/Undressing Upper body dressing/undressing steps patient completed: Thread/unthread right sleeve of pullover shirt/dresss;Thread/unthread left sleeve of pullover shirt/dress;Put head through opening of pull over shirt/dress;Pull shirt over trunk Upper body dressing/undressing: 5: Supervision: Safety issues/verbal cues (Per pt/family report) FIM - Lower Body Dressing/Undressing Lower body dressing/undressing steps patient completed: Thread/unthread right pants leg;Thread/unthread left pants leg Lower body dressing/undressing: 3: Mod-Patient completed 50-74% of tasks (Per pt/family report)  FIM - Toileting Toileting: 1: Total-Patient completed zero steps, helper did all 3  FIM - TRadio producerDevices: Bedside commode (padded tub bench with cut out) Toilet Transfers: 0-Activity did not occur  FIM - BControl and instrumentation engineer Devices: Arm rests;HOB elevated;Bed rails;Sliding board Bed/Chair Transfer: 1: Mechanical lift (Pt mother demonstrated mod I Hoyer lift for transfer bed to w/c Simultaneous filing. User may not have seen previous data.)  FIM - Locomotion: Wheelchair Distance: 25 Locomotion:  Wheelchair: 6: Travels 150 ft or more, turns around, maneuvers to table, bed or toilet, negotiates 3% grade: maneuvers on rugs and over door sills independently FIM - Locomotion: Ambulation Locomotion: Ambulation: 0: Activity did not occur  Comprehension Comprehension Mode: Auditory Comprehension: 6-Follows complex conversation/direction: With extra time/assistive device  Expression Expression Mode: Verbal Expression: 6-Expresses complex ideas: With extra time/assistive device  Social Interaction Social Interaction: 6-Interacts appropriately with others with medication or extra time (anti-anxiety, antidepressant).  Problem Solving Problem Solving: 6-Solves complex problems: With extra time  Memory Memory: 6-More than reasonable amt of time  Medical Problem List and Plan:  1. 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 DVT Prophylaxis/Anticoagulation: Pharmaceutical: Lovenox  3. Chronic Pain Management: increase MS contin to 45 mg bid. Air bed for back and buttock pain as well as pressure relief measures.      -reminded him to use heating pad for shoulder/neck symptoms, good posture when in bed and chair  -continues robaxin. TPI x5 performed yesterday with good results to left periscapular area and traps  -his clavicle fracture also in this general area with distraction laterally---denies any pain in this area.  -lyrica 65m bid for neuropathic pain 4. Mood: reviewed anxiety with patient. Discussed triggers and ways to de-escalate. Also have added prn xanax if he is unable to manage it. 5. Neuropsych: This patient is capable of making  decisions on his own behalf.  6. Spinal cord infarct: steroids off-   7. DM type 2 with gastroparesis: increased lantus to 25 units qhs (gradually increase as needed) . increase novolog to 8u units tid ac  -hold on sliding scale at present due to volatile readings.    -appreciate Dr. ECordelia Penone-time consultation 8. ChronicBLE wounds with chronically elevated ESR: continue doxycline for 30 days (started 02/02/14). Follow up with ID after discharge. Continue local wound care.   -right elbow granulating in  -wet-dry with allevyn over right popliteal area 9. Constipation/diabetic gastroparesis/neurogenic bowel: reglan causing cramping--dc . Bowel program - D/C miralx, cont colace and bisacodyl supp 10. Severe diabetic peripheral neuropathy:resume neurontin 1051mbid an 40028mqhs   11. Pulmonary:  deep breathing, IS/FV, CPAP -quad cough, assistive techniques  -keep hob at 30 degrees  -chest PT 12. Low grade temp:   -labs ok ,cxr ok  -IS, observation 13. CV---increased midodrine to 5mg74mLOS (Days) 28 A FACE TO FACE EVALUATION WAS PERFORMED  ZachMeredith Staggers9/2015, 8:39 AM      Subjective/Complaints: Still having upper back pain.  A 12 point review of systems has been performed and if not noted above is otherwise negative.   Objective: Vital Signs: Blood pressure 123/79, pulse 99, temperature 98.9 F (37.2 C), temperature source Oral, resp. rate 18, weight 58.4 kg (128 lb 12 oz), SpO2 100.00%. No results found. Results for orders placed during the hospital encounter of 02/05/14 (from the past 72 hour(s))  GLUCOSE, CAPILLARY     Status: Abnormal   Collection Time    03/02/14 11:31 AM      Result Value Ref Range   Glucose-Capillary 158 (*) 70 - 99 mg/dL  GLUCOSE, CAPILLARY     Status: None   Collection Time    03/02/14  4:21 PM      Result Value Ref Range   Glucose-Capillary 89  70 - 99 mg/dL  GLUCOSE, CAPILLARY     Status: Abnormal   Collection Time    03/02/14  9:18 PM      Result Value Ref Range   Glucose-Capillary 165 (*) 70 - 99 mg/dL  GLUCOSE, CAPILLARY     Status: Abnormal   Collection Time    03/03/14  2:54 AM      Result Value Ref Range   Glucose-Capillary 234 (*) 70 - 99 mg/dL  BASIC METABOLIC PANEL     Status: Abnormal   Collection Time    03/03/14  5:00 AM      Result Value Ref Range   Sodium 139  137 - 147 mEq/L   Potassium 4.4  3.7 - 5.3 mEq/L   Chloride 104  96 - 112 mEq/L   CO2 24  19 - 32 mEq/L   Glucose, Bld 227 (*) 70 - 99 mg/dL   BUN 37 (*) 6 - 23 mg/dL   Creatinine, Ser 0.83  0.50 - 1.35 mg/dL   Calcium 10.9 (*) 8.4 - 10.5 mg/dL   GFR calc non Af Amer >90  >90 mL/min   GFR calc Af Amer >90  >90 mL/min   Comment: (NOTE)     The eGFR has been calculated using the CKD EPI equation.     This calculation has not been validated in all clinical situations.     eGFR's persistently <90 mL/min signify possible Chronic Kidney     Disease.  CBC WITH DIFFERENTIAL     Status: Abnormal   Collection Time    03/03/14  5:00 AM      Result Value Ref Range   WBC 10.1  4.0 - 10.5 K/uL   RBC 3.31 (*) 4.22 - 5.81 MIL/uL   Hemoglobin 8.2 (*) 13.0 - 17.0 g/dL   Comment: REPEATED TO VERIFY   HCT 27.4 (*) 39.0 - 52.0 %   MCV 82.8  78.0 - 100.0 fL   MCH 24.8 (*) 26.0 - 34.0 pg   MCHC 29.9 (*) 30.0 - 36.0 g/dL   RDW 16.2 (*) 11.5 - 15.5 %   Platelets 605 (*) 150 - 400 K/uL   Comment: REPEATED TO VERIFY   Neutrophils Relative % 44  43 - 77 %   Lymphocytes Relative 37  12 - 46 %   Monocytes Relative 15 (*) 3 - 12 %   Eosinophils Relative 3  0 - 5 %   Basophils Relative 1  0 - 1 %   Neutro Abs 4.5  1.7 - 7.7 K/uL   Lymphs Abs 3.7  0.7 - 4.0 K/uL   Monocytes Absolute 1.5 (*) 0.1 - 1.0 K/uL   Eosinophils Absolute 0.3  0.0 - 0.7 K/uL   Basophils Absolute 0.1  0.0 - 0.1 K/uL   Smear Review PLATELETS APPEAR INCREASED    GLUCOSE, CAPILLARY     Status: Abnormal   Collection Time    03/03/14  7:11 AM      Result Value Ref Range    Glucose-Capillary 215 (*) 70 - 99 mg/dL  GLUCOSE, CAPILLARY     Status: Abnormal   Collection Time    03/03/14 11:33 AM      Result Value Ref Range   Glucose-Capillary 110 (*) 70 - 99 mg/dL  GLUCOSE, CAPILLARY     Status: None   Collection Time    03/03/14  4:56 PM      Result Value Ref Range   Glucose-Capillary 84  70 - 99 mg/dL  GLUCOSE, CAPILLARY     Status: Abnormal   Collection Time    03/03/14  8:29 PM  Result Value Ref Range   Glucose-Capillary 151 (*) 70 - 99 mg/dL  GLUCOSE, CAPILLARY     Status: Abnormal   Collection Time    03/04/14  3:08 AM      Result Value Ref Range   Glucose-Capillary 222 (*) 70 - 99 mg/dL  GLUCOSE, CAPILLARY     Status: Abnormal   Collection Time    03/04/14  7:12 AM      Result Value Ref Range   Glucose-Capillary 162 (*) 70 - 99 mg/dL  GLUCOSE, CAPILLARY     Status: Abnormal   Collection Time    03/04/14 10:58 AM      Result Value Ref Range   Glucose-Capillary 47 (*) 70 - 99 mg/dL   Comment 1 Notify RN    GLUCOSE, CAPILLARY     Status: Abnormal   Collection Time    03/04/14 11:16 AM      Result Value Ref Range   Glucose-Capillary 42 (*) 70 - 99 mg/dL   Comment 1 Notify RN    GLUCOSE, CAPILLARY     Status: None   Collection Time    03/04/14 11:33 AM      Result Value Ref Range   Glucose-Capillary 91  70 - 99 mg/dL   Comment 1 Notify RN    URINALYSIS, ROUTINE W REFLEX MICROSCOPIC     Status: Abnormal   Collection Time    03/04/14  1:14 PM      Result Value Ref Range   Color, Urine YELLOW  YELLOW   APPearance CLEAR  CLEAR   Specific Gravity, Urine 1.021  1.005 - 1.030   pH 5.0  5.0 - 8.0   Glucose, UA 250 (*) NEGATIVE mg/dL   Hgb urine dipstick MODERATE (*) NEGATIVE   Bilirubin Urine NEGATIVE  NEGATIVE   Ketones, ur NEGATIVE  NEGATIVE mg/dL   Protein, ur 30 (*) NEGATIVE mg/dL   Urobilinogen, UA 0.2  0.0 - 1.0 mg/dL   Nitrite NEGATIVE  NEGATIVE   Leukocytes, UA SMALL (*) NEGATIVE  URINE MICROSCOPIC-ADD ON     Status:  Abnormal   Collection Time    03/04/14  1:14 PM      Result Value Ref Range   Squamous Epithelial / LPF RARE  RARE   WBC, UA 7-10  <3 WBC/hpf   RBC / HPF 3-6  <3 RBC/hpf   Bacteria, UA FEW (*) RARE   Urine-Other MANY YEAST    CLOSTRIDIUM DIFFICILE BY PCR     Status: None   Collection Time    03/04/14  1:15 PM      Result Value Ref Range   C difficile by pcr NEGATIVE  NEGATIVE  GLUCOSE, CAPILLARY     Status: Abnormal   Collection Time    03/04/14  4:09 PM      Result Value Ref Range   Glucose-Capillary 417 (*) 70 - 99 mg/dL  GLUCOSE, CAPILLARY     Status: Abnormal   Collection Time    03/04/14  7:14 PM      Result Value Ref Range   Glucose-Capillary 474 (*) 70 - 99 mg/dL   Comment 1 Notify RN    GLUCOSE, CAPILLARY     Status: Abnormal   Collection Time    03/04/14  9:26 PM      Result Value Ref Range   Glucose-Capillary 520 (*) 70 - 99 mg/dL   Comment 1 Notify RN    GLUCOSE, RANDOM     Status: Abnormal  Collection Time    03/04/14 10:03 PM      Result Value Ref Range   Glucose, Bld 495 (*) 70 - 99 mg/dL  GLUCOSE, CAPILLARY     Status: Abnormal   Collection Time    03/04/14 10:17 PM      Result Value Ref Range   Glucose-Capillary 433 (*) 70 - 99 mg/dL   Comment 1 Notify RN    GLUCOSE, CAPILLARY     Status: Abnormal   Collection Time    03/04/14 11:17 PM      Result Value Ref Range   Glucose-Capillary 467 (*) 70 - 99 mg/dL   Comment 1 Notify RN    GLUCOSE, CAPILLARY     Status: Abnormal   Collection Time    03/05/14 12:15 AM      Result Value Ref Range   Glucose-Capillary 460 (*) 70 - 99 mg/dL   Comment 1 Notify RN    GLUCOSE, CAPILLARY     Status: Abnormal   Collection Time    03/05/14  1:19 AM      Result Value Ref Range   Glucose-Capillary 457 (*) 70 - 99 mg/dL   Comment 1 Notify RN    GLUCOSE, CAPILLARY     Status: Abnormal   Collection Time    03/05/14  2:21 AM      Result Value Ref Range   Glucose-Capillary 510 (*) 70 - 99 mg/dL   Comment 1 Notify  RN    BASIC METABOLIC PANEL     Status: Abnormal   Collection Time    03/05/14  2:35 AM      Result Value Ref Range   Sodium 132 (*) 137 - 147 mEq/L   Potassium 4.9  3.7 - 5.3 mEq/L   Chloride 96  96 - 112 mEq/L   CO2 24  19 - 32 mEq/L   Glucose, Bld 537 (*) 70 - 99 mg/dL   BUN 46 (*) 6 - 23 mg/dL   Creatinine, Ser 1.02  0.50 - 1.35 mg/dL   Calcium 9.9  8.4 - 10.5 mg/dL   GFR calc non Af Amer >90  >90 mL/min   GFR calc Af Amer >90  >90 mL/min   Comment: (NOTE)     The eGFR has been calculated using the CKD EPI equation.     This calculation has not been validated in all clinical situations.     eGFR's persistently <90 mL/min signify possible Chronic Kidney     Disease.  GLUCOSE, CAPILLARY     Status: Abnormal   Collection Time    03/05/14  3:19 AM      Result Value Ref Range   Glucose-Capillary 497 (*) 70 - 99 mg/dL   Comment 1 Notify RN    GLUCOSE, CAPILLARY     Status: Abnormal   Collection Time    03/05/14  5:37 AM      Result Value Ref Range   Glucose-Capillary 405 (*) 70 - 99 mg/dL   Comment 1 Notify RN    CBC WITH DIFFERENTIAL     Status: Abnormal   Collection Time    03/05/14  6:52 AM      Result Value Ref Range   WBC 10.9 (*) 4.0 - 10.5 K/uL   RBC 3.16 (*) 4.22 - 5.81 MIL/uL   Hemoglobin 7.9 (*) 13.0 - 17.0 g/dL   Comment: REPEATED TO VERIFY   HCT 25.7 (*) 39.0 - 52.0 %   MCV 81.3  78.0 -  100.0 fL   MCH 25.0 (*) 26.0 - 34.0 pg   MCHC 30.7  30.0 - 36.0 g/dL   RDW 16.1 (*) 11.5 - 15.5 %   Platelets 565 (*) 150 - 400 K/uL   Comment: REPEATED TO VERIFY   Neutrophils Relative % 50  43 - 77 %   Lymphocytes Relative 36  12 - 46 %   Monocytes Relative 13 (*) 3 - 12 %   Eosinophils Relative 1  0 - 5 %   Basophils Relative 0  0 - 1 %   Neutro Abs 5.5  1.7 - 7.7 K/uL   Lymphs Abs 3.9  0.7 - 4.0 K/uL   Monocytes Absolute 1.4 (*) 0.1 - 1.0 K/uL   Eosinophils Absolute 0.1  0.0 - 0.7 K/uL   Basophils Absolute 0.0  0.0 - 0.1 K/uL   WBC Morphology ATYPICAL LYMPHOCYTES     GLUCOSE, CAPILLARY     Status: Abnormal   Collection Time    03/05/14  7:43 AM      Result Value Ref Range   Glucose-Capillary 336 (*) 70 - 99 mg/dL     HEENT: normal Cardio: RRR and no murmur Resp: CTA B/L and unlabored GI: BS positive and mildly distended and tympanitic, mild tenderness Extremity:  No Edema Skin:  . Neuro: Alert/Oriented, Abnormal Sensory absent in feet and Abnormal Motor 0/5 in LE,0/5 finger extension and 0/5 hand intrinsic. Wrist extension 2-, 3/5 tricep   2, biceps 5/5. Decreased sensation below elbows bilaterally but able to ID LT in Left C6-7 dermatomes. ?trace HF on right. Musc/Skel:  Generalized upper thoracic spine tenderness.  Gen NAD   Assessment/Plan: 1. 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 Physiatrist is providing close team supervision and 24 hour management of active medical problems listed below. Physiatrist and rehab team continue to assess barriers to discharge/monitor patient progress toward functional and medical goals.   FIM: FIM - Bathing Bathing Steps Patient Completed: Chest;Right Arm;Left Arm;Abdomen;Front perineal area Bathing: 3: Mod-Patient completes 5-7 66f10 parts or 50-74% (Per pt and family report)  FIM - Upper Body Dressing/Undressing Upper body dressing/undressing steps patient completed: Thread/unthread right sleeve of pullover shirt/dresss;Thread/unthread left sleeve of pullover shirt/dress;Put head through opening of pull over shirt/dress;Pull shirt over trunk Upper body dressing/undressing: 5: Supervision: Safety issues/verbal cues (Per pt/family report) FIM - Lower Body Dressing/Undressing Lower body dressing/undressing steps patient completed: Thread/unthread right pants leg;Thread/unthread left pants leg Lower body dressing/undressing: 3: Mod-Patient completed 50-74% of tasks (Per pt/family report)  FIM -  Toileting Toileting: 1: Total-Patient completed zero steps, helper did all 3  FIM - TRadio producerDevices: Bedside commode (padded tub bench with cut out) Toilet Transfers: 0-Activity did not occur  FIM - BControl and instrumentation engineerDevices: Arm rests;HOB elevated;Bed rails;Sliding board Bed/Chair Transfer: 1: Mechanical lift (Pt mother demonstrated mod I Hoyer lift for transfer bed to w/c Simultaneous filing. User may not have seen previous data.)  FIM - Locomotion: Wheelchair Distance: 25 Locomotion: Wheelchair: 6: Travels 150 ft or more, turns around, maneuvers to table, bed or toilet, negotiates 3% grade: maneuvers on rugs and over door sills independently FIM - Locomotion: Ambulation Locomotion: Ambulation: 0: Activity did not occur  Comprehension Comprehension Mode: Auditory Comprehension: 6-Follows complex conversation/direction: With extra time/assistive device  Expression Expression Mode: Verbal Expression: 6-Expresses complex ideas: With extra time/assistive device  Social Interaction Social Interaction: 6-Interacts appropriately with  others with medication or extra time (anti-anxiety, antidepressant).  Problem Solving Problem Solving: 6-Solves complex problems: With extra time  Memory Memory: 6-More than reasonable amt of time  Medical Problem List and Plan:  1. 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 DVT Prophylaxis/Anticoagulation: Pharmaceutical: Lovenox  3. Chronic Pain Management: increase MS contin to 45 mg bid. Air bed for back and buttock pain as well as pressure relief measures.      -reminded him to use heating pad for shoulder/neck symptoms, good posture when in bed and chair  -continues robaxin (758m). TPI x5 performed yesterday with good results to left periscapular area and traps  -his clavicle fracture also in this general  area with distraction laterally---denies any pain in this area.   -lyrica 788mTID  -needs better pain coping skills 4. Mood: reviewed anxiety with patient. Discussed triggers and ways to de-escalate. Also have added prn xanax if he is unable to manage it. 5. Neuropsych: This patient is capable of making decisions on his own behalf.  6. Spinal cord infarct: steroids off-   7. DM type 2 with gastroparesis: use lantus 30u qhs . maintain novolog to 8u units tid ac yesterday  -hold on sliding scale at present due to volatile readings.    -sugars are the best that they've been since admission 8. Chronic wounds with chronically elevated ESR: continue doxycline for 30 days (started 02/02/14). Follow up with ID after discharge. Continue local wound care.   -right elbow granulating in  -wet-dry with allevyn over right popliteal area  -will need ongoing wound care after dc 9. Constipation/diabetic gastroparesis/neurogenic bowel: . [place] Kitchenowel program -cont bisacodyl supp  -[place] need further work [place] peripheral neuropathy:resume neurontin 10091mid an 400m46mhs   11. Pulmonary:  deep breathing, IS/FV, CPAP -quad cough, assistive techniques  -keep hob at 30 degrees  -chest PT 12. Low grade temp:   -labs still ok ,cxr ok  -IS, observation 13. CV---increase midodrine to 5mg 66mOS (Days) 28 A FACE TO FACE EVALUATION WAS PERFORMED  ZachaMeredith Staggers/2015, 8:39 AM

## 2014-03-05 NOTE — Progress Notes (Signed)
Nursing Note: Pt's fingerstick glucose is 405 mg/dl.Jason [name] on unit and aware of events of the night and current cbg. No new orders obtained . Pt to be seen on rounds. Asleep but arouses easily and IVF infusing. wbb

## 2014-03-05 NOTE — Progress Notes (Signed)
Adult ICU Glycemic Control Treatment Guidelines Sidebar Report  ICU Glycemic Control Protocol Subcutaneous insulin (Phase 1)  1. Monitor CBG every 4 hours 2. Administer correction coverage with insulin aspart (NOVOLOG) subcutaneously every 4 hours according to the sliding scale 3. If CBG < 70 mg/dL use Hypoglycemia Order Set a. In Order Management under Order Sets search for Hypoglycemia Order Set b. Place orders per protocol, cosign required 4. If there is one CBG > 250 mg/dL or two subsequent CBG > 200 mg/dL a. Search for ICU Glycemic Control Order Set (Phase 2) in Order Management under Order Sets and place orders per protocol, cosign required. b. In Order Management select Active Orders and View by Order Set. Discontinue ALL orders from ICU Glycemic Control Order Set (Phase 1) per protocol, cosign required.  ICU Glycemic Control Protocol IV insulin (Phase 2)  1. Monitor CBG every 1 hour 2. Administer regular insulin intravenously per GlucoStabilizer (MDN-CGS) software.   Set high target = 180, low target = 140, multiplier = 0.01   Do NOT use glucose lab values (use CBG readings only)   If CBG reads "Critical High", enter 601   If on TF / TNA and it is discontinued, enter original multiplier 3. If CBG < 70    Follow GlucoStabilizer Instructions   Recheck POCT CBG in 15 minutes   Change multiplier to 0.01 4. Notify MD if   Prompted by GlucoStabilizer:     If insulin drip rate is greater than 30 units/hour, stop insulin drip for 30 minutes and restart GS with original multiplier 0.01   CBG<70 after treating hypoglycemia twice   CBG<250 and there is no IV dextrose, TNA or TF   Withholding insulin for more then 1 hour  5. Initiate Phase III (Transition ICU Glycemic Control Order Set) when all of the following are met:    the insulin infusion rate is < 4 units / hour    tube feeds are at goal rate and stable     there are 6 subsequent CBG readings < 180 mg/dL 6. If patient is on TNA,  await further steps until TNA pharmacist sees patient.    In Order Management under Order Sets search for ICU Glycemic Control Order Set (Phase 3).  Initiate all orders from ICU Glycemic Control Order Set (Phase 2) per protocol, cosign required.   In Order Management select Active Orders and View by Order Set. Discontinue ALL orders form ICU Glycemic Control Order Set (Phase II) per protocol, cosign required.  ICU Glycemic Control Protocol Transition (Phase 3) **Choose appropriate Lantus and tube feed coverage based on last insulin drip rate.  Choose correction Novolog based on patients most recent glomerular filtration rate (GFR).  If GFR less than 30 choose sensitive, and if GFR greater than 30, choose standard. Please verify insulin drip rate/transition doses with 2nd RN.  1. Give first dose of Lantus now and q 24 hours per last insulin drip rate, and discontinue insulin drip 2 hours after Lantus given. 2. Monitor CBG every 4 hours 3. Administer correction insulin (NOVOLOG) [place] according to sliding scale 4. Administer tube feeding coverage (NOVOLOG) as ordered [place] every 4 hours after tubes feeds are at goal. If patient is not receiving TF, it is not at goal, or coverage is not ordered - skip this step.   5. If tube feeding / TNA is stopped, start D10 infusion at 40 mL/h and hold tube feeding coverage.    6. If there is one CBG reading >   250 mg/dL or two subsequent CBG readings > 200 mg/dL switch to ICU Glycemic Control Order Set (Phase II) a. In Order Management select Active Orders and View by Order Set. Discontinue ALL orders form ICU Glycemic Control Order Set (Phase III) per protocol, cosign required. 7. In Order Management under Order Sets search for ICU Glycemic Control Order Set (Phase II).  Initiate ALL orders form ICU Glycemic Control Order Set (Phase II) per protocol, cosign required. 8.  

## 2014-03-05 NOTE — Progress Notes (Signed)
Nursing Note: Pt fingerstick is 460 mg.dl and pt is assymptomatic.A: Paged on-call Pam Love,pa. To re-check in 1 hour. wbb

## 2014-03-05 NOTE — Discharge Instructions (Signed)
Inpatient Rehab Discharge Instructions  NASII[facility] Discharge date and time: 03/04/14   Activities/Precautions/ Functional Status: Activity: activity as tolerated Diet: diabetic diet Need to drink at least two liters fluid daily.  Wound Care: Padded dressing to right elbow.  Functional status:  ___ No restrictions     ___ Walk up steps independently _X__ 24/7 supervision/assistance   ___ Walk up steps with assistance ___ Intermittent supervision/assistance  ___ Bathe/dress independently ___ Walk with walker     _X__ Bathe/dress with assistance ___ Walk Independently    ___ Shower independently ___ Walk with assistance    ___ Shower with assistance _X__ No alcohol     ___ Return to work/school ________     COMMUNITY REFERRALS UPON DISCHARGE:    Home Health:   PT     OT     RN       SW                   Agency: [facility] Phone: [phone]   Medical Equipment/Items Ordered: hospital bed with gel overlay, hoyer lift and padded tub bench with cut out seat                                                     Agency/Supplier: [employer] @ [phone]  Other:  Referral place for [facility] (Aide) services - you should be hearing from them within next two weeks  GENERAL COMMUNITY RESOURCES FOR PATIENT/FAMILY:  Home Modification: Referral place with [facility] - they should be in contact with you                                                                 [phone]      Special Instructions: 1. You will need to call Dr. Charm Barges office one week before you run out of pain medication. You have one month supply. You will need to take in your medication bottles whenever you go in to the office. 2. Call office for refills on pain medications--[phone]. 3. Check blood sugars before meals, 3 am and at bedtime.  4. Need to boost every 20 minutes when in chair or in bed.  5. Protein supplements two to three times a day to help with wound healing.  6. Continue  Sliding scale insulin depending on carb intake.  7. Need to continue daily bowel program with suppository and digital stimulation to help with evacuation.  My questions have been answered and I understand these instructions. I will adhere to these goals and the provided educational materials after my discharge from the hospital.  Patient/Caregiver Signature _______________________________ Date __________  Clinician Signature _______________________________________ Date __________  Please bring this form and your medication list with you to all your follow-up doctor's appointments.

## 2014-03-05 NOTE — Progress Notes (Signed)
Patient and mother given discharge instructions by Algis Liming, PA; all questions answered.  Patient refuses to have dressings changed prior to discharge.  Patient taken home via ambulance.

## 2014-03-05 NOTE — Progress Notes (Signed)
Adult ICU Glycemic Control Treatment Guidelines Sidebar Report  ICU Glycemic Control Protocol Subcutaneous insulin (Phase 1)  1. Monitor CBG every 4 hours 2. Administer correction coverage with insulin aspart (NOVOLOG) subcutaneously every 4 hours according to the sliding scale 3. If CBG < 70 mg/dL use Hypoglycemia Order Set a. In Order Management under Order Sets search for Hypoglycemia Order Set b. Place orders per protocol, cosign required 4. If there is one CBG > 250 mg/dL or two subsequent CBG > 200 mg/dL a. Search for ICU Glycemic Control Order Set (Phase 2) in Order Management under Order Sets and place orders per protocol, cosign required. b. In Order Management select Active Orders and View by Order Set. Discontinue ALL orders from ICU Glycemic Control Order Set (Phase 1) per protocol, cosign required.  ICU Glycemic Control Protocol IV insulin (Phase 2)  1. Monitor CBG every 1 hour 2. Administer regular insulin intravenously per GlucoStabilizer (MDN-CGS) software.   Set high target = 180, low target = 140, multiplier = 0.01   Do NOT use glucose lab values (use CBG readings only)   If CBG reads "Critical High", enter 601   If on TF / TNA and it is discontinued, enter original multiplier 3. If CBG < 70    Follow GlucoStabilizer Instructions   Recheck POCT CBG in 15 minutes   Change multiplier to 0.01 4. Notify MD if   Prompted by GlucoStabilizer:     If insulin drip rate is greater than 30 units/hour, stop insulin drip for 30 minutes and restart GS with original multiplier 0.01   CBG<70 after treating hypoglycemia twice   CBG<250 and there is no IV dextrose, TNA or TF   Withholding insulin for more then 1 hour  5. Initiate Phase III (Transition ICU Glycemic Control Order Set) when all of the following are met:    the insulin infusion rate is < 4 units / hour    tube feeds are at goal rate and stable     there are 6 subsequent CBG readings < 180 mg/dL 6. If patient is on TNA,  await further steps until TNA pharmacist sees patient.    In Order Management under Order Sets search for ICU Glycemic Control Order Set (Phase 3).  Initiate all orders from ICU Glycemic Control Order Set (Phase 2) per protocol, cosign required.   In Order Management select Active Orders and View by Order Set. Discontinue ALL orders form ICU Glycemic Control Order Set (Phase II) per protocol, cosign required.  ICU Glycemic Control Protocol Transition (Phase 3) **Choose appropriate Lantus and tube feed coverage based on last insulin drip rate.  Choose correction Novolog based on patients most recent glomerular filtration rate (GFR).  If GFR less than 30 choose sensitive, and if GFR greater than 30, choose standard. Please verify insulin drip rate/transition doses with 2nd RN.  1. Give first dose of Lantus now and q 24 hours per last insulin drip rate, and discontinue insulin drip 2 hours after Lantus given. 2. Monitor CBG every 4 hours 3. Administer correction insulin (NOVOLOG) [place] according to sliding scale 4. Administer tube feeding coverage (NOVOLOG) as ordered [place] every 4 hours after tubes feeds are at goal. If patient is not receiving TF, it is not at goal, or coverage is not ordered - skip this step.   5. If tube feeding / TNA is stopped, start D10 infusion at 40 mL/h and hold tube feeding coverage.    6. If there is one CBG reading >   250 mg/dL or two subsequent CBG readings > 200 mg/dL switch to ICU Glycemic Control Order Set (Phase II) a. In Order Management select Active Orders and View by Order Set. Discontinue ALL orders form ICU Glycemic Control Order Set (Phase III) per protocol, cosign required. 7. In Order Management under Order Sets search for ICU Glycemic Control Order Set (Phase II).  Initiate ALL orders form ICU Glycemic Control Order Set (Phase II) per protocol, cosign required. 8.  

## 2014-03-05 NOTE — Progress Notes (Signed)
Discussed staying overnight to monitor for trend as well as await results of urine culture. He's likely rebounding from low BS yesterday at lunch. Patient and mother set on going home today but willing to wait till the end of day to see how the day progresses. Discussed concerns of simmering infection and will send patient home on empiric antibiotics for UTI. Discussed foley v/s in and out cath--Jason [name] does not want his mother doing caths and has elected on indwelling foley. Recommended following up with urology for input on SP cath in the future.  Mother is aware of concerns and feels that they will do well at home and  she'll watch closely for any changes or signs of infection. She's very familiar with his BS trends and has mild anxiety but denies any concerns regarding discharge.

## 2014-03-05 NOTE — Progress Notes (Signed)
Blood sugar elevated and slowly coming down. Historically hs coverage causes patient to have early am hypoglycemic episodes. He is dry and will hydrate him with IVF X 500 cc to help release tissue bound insulin. Recheck lytes in am. Will treat yeast in urine with diflucan X 1 dose

## 2014-03-05 NOTE — Progress Notes (Signed)
Adult ICU Glycemic Control Treatment Guidelines Sidebar Report  ICU Glycemic Control Protocol Subcutaneous insulin (Phase 1)  1. Monitor CBG every 4 hours 2. Administer correction coverage with insulin aspart (NOVOLOG) subcutaneously every 4 hours according to the sliding scale 3. If CBG < 70 mg/dL use Hypoglycemia Order Set a. In Order Management under Order Sets search for Hypoglycemia Order Set b. Place orders per protocol, cosign required 4. If there is one CBG > 250 mg/dL or two subsequent CBG > 200 mg/dL a. Search for ICU Glycemic Control Order Set (Phase 2) in Order Management under Order Sets and place orders per protocol, cosign required. b. In Order Management select Active Orders and View by Order Set. Discontinue ALL orders from ICU Glycemic Control Order Set (Phase 1) per protocol, cosign required.  ICU Glycemic Control Protocol IV insulin (Phase 2)  1. Monitor CBG every 1 hour 2. Administer regular insulin intravenously per [employer] (MDN-CGS) software.   Set high target = 180, low target = 140, multiplier = 0.01   Do NOT use glucose lab values (use CBG readings only)   If CBG reads "Critical High", enter 601   If on TF / TNA and it is discontinued, enter original multiplier 3. If CBG < 70    Follow GlucoStabilizer Instructions   Recheck POCT CBG in 15 minutes   Change multiplier to 0.01 4. Notify MD if   Prompted by GlucoStabilizer:     If insulin drip rate is greater than 30 units/hour, stop insulin drip for 30 minutes and restart GS with original multiplier 0.01   CBG<70 after treating hypoglycemia twice   CBG<250 and there is no IV dextrose, TNA or TF   Withholding insulin for more then 1 hour  5. Initiate Phase III (Transition ICU Glycemic Control Order Set) when all of the following are met:    the insulin infusion rate is < 4 units / hour    tube feeds are at goal rate and stable     there are 6 subsequent CBG readings < 180 mg/dL 6. If patient is on TNA,  await further steps until TNA pharmacist sees patient.    In Order Management under Order Sets search for ICU Glycemic Control Order Set (Phase 3).  Initiate all orders from ICU Glycemic Control Order Set (Phase 2) per protocol, cosign required.   In Order Management select Active Orders and View by Order Set. Discontinue ALL orders form ICU Glycemic Control Order Set (Phase II) per protocol, cosign required.  ICU Glycemic Control Protocol Transition (Phase 3) **Choose appropriate Lantus and tube feed coverage based on last insulin drip rate.  Choose correction Novolog based on patients most recent glomerular filtration rate (GFR).  If GFR less than 30 choose sensitive, and if GFR greater than 30, choose standard. Please verify insulin drip rate/transition doses with 2nd RN.  1. Give first dose of Lantus now and q 24 hours per last insulin drip rate, and discontinue insulin drip 2 hours after Lantus given. 2. Monitor CBG every 4 hours 3. Administer correction insulin (NOVOLOG) [place] according to sliding scale 4. Administer tube feeding coverage (NOVOLOG) as ordered [place] every 4 hours after tubes feeds are at goal. If patient is not receiving TF, it is not at goal, or coverage is not ordered - skip this step.   5. If tube feeding / TNA is stopped, start D10 infusion at 40 mL/h and hold tube feeding coverage.    6. If there is one CBG reading >  250 mg/dL or two subsequent CBG readings > 200 mg/dL switch to ICU Glycemic Control Order Set (Phase II) a. In Order Management select Active Orders and View by Order Set. Discontinue ALL orders form ICU Glycemic Control Order Set (Phase III) per protocol, cosign required. 7. In Order Management under Order Sets search for ICU Glycemic Control Order Set (Phase II).  Initiate ALL orders form ICU Glycemic Control Order Set (Phase II) per protocol, cosign required. 8.

## 2014-03-05 NOTE — Progress Notes (Signed)
Nursing Note: Pt's fingerstick is 457 mg/dl.A: Paged on-call and made aware that fingerstick is 457. No new orders and no need to call unless pt is symptomatic or fingerstick increases.Pt resting quietly in bed. Pt distress.wbb

## 2014-03-07 LAB — URINE CULTURE

## 2014-03-08 DIAGNOSIS — N390 Urinary tract infection, site not specified: Secondary | ICD-10-CM | POA: Diagnosis present

## 2014-03-09 ENCOUNTER — Telehealth: Payer: Self-pay | Admitting: Physical Medicine & Rehabilitation

## 2014-03-09 NOTE — Telephone Encounter (Signed)
Attempted to contact patient. Left a voicemail to return call to clinic.

## 2014-03-09 NOTE — Telephone Encounter (Signed)
Yes he is taking the cipro.  Pam called them last night.

## 2014-03-09 NOTE — Telephone Encounter (Signed)
Recommend cipro 250mg  bid for UTI---pt is already on---please make sure pt is taking medication and that he's aware urine is sensitive to abx  thx

## 2014-03-10 ENCOUNTER — Telehealth: Payer: Self-pay | Admitting: Family Medicine

## 2014-03-10 NOTE — Telephone Encounter (Signed)
Pt needs new rx percocet

## 2014-03-10 NOTE — Telephone Encounter (Signed)
NO, he is supposed to be getting pain meds from Dr. Naaman Plummer

## 2014-03-10 NOTE — Telephone Encounter (Signed)
Pt is aware, per Malachy Mood.

## 2014-03-15 ENCOUNTER — Ambulatory Visit: Payer: Self-pay | Admitting: Family Medicine

## 2014-03-16 ENCOUNTER — Ambulatory Visit: Payer: Self-pay | Admitting: Family Medicine

## 2014-03-16 ENCOUNTER — Inpatient Hospital Stay (HOSPITAL_COMMUNITY): Payer: Medicaid Other

## 2014-03-16 ENCOUNTER — Inpatient Hospital Stay (HOSPITAL_COMMUNITY)
Admission: EM | Admit: 2014-03-16 | Discharge: 2014-03-21 | DRG: 871 | Disposition: A | Discharge status: Home Health | Payer: Medicaid Other | Source: Other Acute Inpatient Hospital | Attending: Internal Medicine | Admitting: Internal Medicine

## 2014-03-16 ENCOUNTER — Encounter (HOSPITAL_COMMUNITY): Payer: Self-pay | Admitting: *Deleted

## 2014-03-16 ENCOUNTER — Telehealth: Payer: Self-pay | Admitting: Family Medicine

## 2014-03-16 DIAGNOSIS — R5381 Other malaise: Secondary | ICD-10-CM | POA: Diagnosis present

## 2014-03-16 DIAGNOSIS — E1065 Type 1 diabetes mellitus with hyperglycemia: Secondary | ICD-10-CM

## 2014-03-16 DIAGNOSIS — L97509 Non-pressure chronic ulcer of other part of unspecified foot with unspecified severity: Secondary | ICD-10-CM | POA: Diagnosis present

## 2014-03-16 DIAGNOSIS — E43 Unspecified severe protein-calorie malnutrition: Secondary | ICD-10-CM

## 2014-03-16 DIAGNOSIS — L8993 Pressure ulcer of unspecified site, stage 3: Secondary | ICD-10-CM | POA: Diagnosis not present

## 2014-03-16 DIAGNOSIS — E1040 Type 1 diabetes mellitus with diabetic neuropathy, unspecified: Secondary | ICD-10-CM | POA: Diagnosis present

## 2014-03-16 DIAGNOSIS — J690 Pneumonitis due to inhalation of food and vomit: Secondary | ICD-10-CM | POA: Diagnosis not present

## 2014-03-16 DIAGNOSIS — K219 Gastro-esophageal reflux disease without esophagitis: Secondary | ICD-10-CM | POA: Diagnosis present

## 2014-03-16 DIAGNOSIS — G9341 Metabolic encephalopathy: Secondary | ICD-10-CM | POA: Diagnosis present

## 2014-03-16 DIAGNOSIS — G8220 Paraplegia, unspecified: Secondary | ICD-10-CM | POA: Diagnosis present

## 2014-03-16 DIAGNOSIS — R6520 Severe sepsis without septic shock: Secondary | ICD-10-CM

## 2014-03-16 DIAGNOSIS — G8252 Quadriplegia, C1-C4 incomplete: Secondary | ICD-10-CM

## 2014-03-16 DIAGNOSIS — G40309 Generalized idiopathic epilepsy and epileptic syndromes, not intractable, without status epilepticus: Secondary | ICD-10-CM

## 2014-03-16 DIAGNOSIS — Z833 Family history of diabetes mellitus: Secondary | ICD-10-CM | POA: Diagnosis not present

## 2014-03-16 DIAGNOSIS — Z825 Family history of asthma and other chronic lower respiratory diseases: Secondary | ICD-10-CM

## 2014-03-16 DIAGNOSIS — K3184 Gastroparesis: Secondary | ICD-10-CM | POA: Diagnosis present

## 2014-03-16 DIAGNOSIS — E1142 Type 2 diabetes mellitus with diabetic polyneuropathy: Secondary | ICD-10-CM

## 2014-03-16 DIAGNOSIS — I951 Orthostatic hypotension: Secondary | ICD-10-CM | POA: Diagnosis present

## 2014-03-16 DIAGNOSIS — E1069 Type 1 diabetes mellitus with other specified complication: Secondary | ICD-10-CM | POA: Diagnosis not present

## 2014-03-16 DIAGNOSIS — E872 Acidosis, unspecified: Secondary | ICD-10-CM | POA: Diagnosis not present

## 2014-03-16 DIAGNOSIS — J9600 Acute respiratory failure, unspecified whether with hypoxia or hypercapnia: Secondary | ICD-10-CM | POA: Diagnosis not present

## 2014-03-16 DIAGNOSIS — E1049 Type 1 diabetes mellitus with other diabetic neurological complication: Secondary | ICD-10-CM | POA: Diagnosis not present

## 2014-03-16 DIAGNOSIS — E1059 Type 1 diabetes mellitus with other circulatory complications: Secondary | ICD-10-CM | POA: Diagnosis present

## 2014-03-16 DIAGNOSIS — E10622 Type 1 diabetes mellitus with other skin ulcer: Secondary | ICD-10-CM

## 2014-03-16 DIAGNOSIS — Z794 Long term (current) use of insulin: Secondary | ICD-10-CM

## 2014-03-16 DIAGNOSIS — S42009B Fracture of unspecified part of unspecified clavicle, initial encounter for open fracture: Secondary | ICD-10-CM | POA: Diagnosis not present

## 2014-03-16 DIAGNOSIS — Z823 Family history of stroke: Secondary | ICD-10-CM

## 2014-03-16 DIAGNOSIS — J9601 Acute respiratory failure with hypoxia: Secondary | ICD-10-CM | POA: Diagnosis present

## 2014-03-16 DIAGNOSIS — E119 Type 2 diabetes mellitus without complications: Secondary | ICD-10-CM

## 2014-03-16 DIAGNOSIS — G9511 Acute infarction of spinal cord (embolic) (nonembolic): Secondary | ICD-10-CM | POA: Diagnosis present

## 2014-03-16 DIAGNOSIS — G9519 Other vascular myelopathies: Secondary | ICD-10-CM | POA: Diagnosis present

## 2014-03-16 DIAGNOSIS — J45909 Unspecified asthma, uncomplicated: Secondary | ICD-10-CM

## 2014-03-16 DIAGNOSIS — A419 Sepsis, unspecified organism: Principal | ICD-10-CM | POA: Diagnosis present

## 2014-03-16 DIAGNOSIS — J189 Pneumonia, unspecified organism: Secondary | ICD-10-CM | POA: Diagnosis present

## 2014-03-16 DIAGNOSIS — F17200 Nicotine dependence, unspecified, uncomplicated: Secondary | ICD-10-CM | POA: Diagnosis not present

## 2014-03-16 DIAGNOSIS — Z8249 Family history of ischemic heart disease and other diseases of the circulatory system: Secondary | ICD-10-CM | POA: Diagnosis not present

## 2014-03-16 DIAGNOSIS — E11621 Type 2 diabetes mellitus with foot ulcer: Secondary | ICD-10-CM

## 2014-03-16 DIAGNOSIS — IMO0002 Reserved for concepts with insufficient information to code with codable children: Secondary | ICD-10-CM | POA: Diagnosis present

## 2014-03-16 DIAGNOSIS — E1143 Type 2 diabetes mellitus with diabetic autonomic (poly)neuropathy: Secondary | ICD-10-CM

## 2014-03-16 DIAGNOSIS — N319 Neuromuscular dysfunction of bladder, unspecified: Secondary | ICD-10-CM | POA: Diagnosis not present

## 2014-03-16 DIAGNOSIS — L89109 Pressure ulcer of unspecified part of back, unspecified stage: Secondary | ICD-10-CM | POA: Diagnosis not present

## 2014-03-16 DIAGNOSIS — D649 Anemia, unspecified: Secondary | ICD-10-CM | POA: Diagnosis not present

## 2014-03-16 DIAGNOSIS — J069 Acute upper respiratory infection, unspecified: Secondary | ICD-10-CM

## 2014-03-16 DIAGNOSIS — IMO0001 Reserved for inherently not codable concepts without codable children: Secondary | ICD-10-CM

## 2014-03-16 LAB — URINALYSIS, ROUTINE W REFLEX MICROSCOPIC
BILIRUBIN URINE: NEGATIVE
Glucose, UA: 100 mg/dL — AB
Ketones, ur: NEGATIVE mg/dL
Nitrite: NEGATIVE
PROTEIN: 30 mg/dL — AB
Specific Gravity, Urine: 1.016 (ref 1.005–1.030)
Urobilinogen, UA: 0.2 mg/dL (ref 0.0–1.0)
pH: 5.5 (ref 5.0–8.0)

## 2014-03-16 LAB — URINE MICROSCOPIC-ADD ON

## 2014-03-16 LAB — MRSA PCR SCREENING: MRSA by PCR: NEGATIVE

## 2014-03-16 LAB — GLUCOSE, CAPILLARY
GLUCOSE-CAPILLARY: 229 mg/dL — AB (ref 70–99)
Glucose-Capillary: 226 mg/dL — ABNORMAL HIGH (ref 70–99)

## 2014-03-16 LAB — PROCALCITONIN: PROCALCITONIN: 10.6 ng/mL

## 2014-03-16 MED ORDER — SODIUM CHLORIDE 0.9 % IV SOLN
0.0000 ug/h | INTRAVENOUS | Status: DC
  Administered 2014-03-16: 50 ug/h via INTRAVENOUS
  Administered 2014-03-17: 300 ug/h via INTRAVENOUS
  Filled 2014-03-16 (×2): qty 50

## 2014-03-16 MED ORDER — INSULIN GLARGINE 100 UNIT/ML ~~LOC~~ SOLN
10.0000 [IU] | Freq: Every day | SUBCUTANEOUS | Status: DC
  Administered 2014-03-16 – 2014-03-17 (×2): 10 [IU] via SUBCUTANEOUS
  Filled 2014-03-16 (×3): qty 0.1

## 2014-03-16 MED ORDER — MIDAZOLAM HCL 2 MG/2ML IJ SOLN
2.0000 mg | INTRAMUSCULAR | Status: DC | PRN
  Administered 2014-03-16 – 2014-03-17 (×3): 2 mg via INTRAVENOUS
  Filled 2014-03-16 (×2): qty 2

## 2014-03-16 MED ORDER — FENTANYL BOLUS VIA INFUSION
50.0000 ug | INTRAVENOUS | Status: DC | PRN
  Filled 2014-03-16: qty 100

## 2014-03-16 MED ORDER — FENTANYL CITRATE 0.05 MG/ML IJ SOLN: 100.0000 ug | INTRAMUSCULAR | Status: DC | PRN

## 2014-03-16 MED ORDER — MIDAZOLAM HCL 2 MG/2ML IJ SOLN
INTRAMUSCULAR | Status: AC
  Filled 2014-03-16: qty 2

## 2014-03-16 MED ORDER — SODIUM CHLORIDE 0.9 % IV SOLN
INTRAVENOUS | Status: DC
  Administered 2014-03-16: 50 mL/h via INTRAVENOUS
  Administered 2014-03-17 – 2014-03-20 (×3): via INTRAVENOUS

## 2014-03-16 MED ORDER — DEXTROSE 5 % IV SOLN
1.0000 g | Freq: Three times a day (TID) | INTRAVENOUS | Status: DC
  Administered 2014-03-16 – 2014-03-21 (×14): 1 g via INTRAVENOUS
  Filled 2014-03-16 (×18): qty 1

## 2014-03-16 MED ORDER — FENTANYL CITRATE 0.05 MG/ML IJ SOLN: 50.0000 ug | Freq: Once | INTRAMUSCULAR | Status: DC

## 2014-03-16 MED ORDER — ENOXAPARIN SODIUM 40 MG/0.4ML ~~LOC~~ SOLN
40.0000 mg | SUBCUTANEOUS | Status: DC
  Administered 2014-03-16 – 2014-03-20 (×5): 40 mg via SUBCUTANEOUS
  Filled 2014-03-16 (×6): qty 0.4

## 2014-03-16 MED ORDER — INSULIN ASPART 100 UNIT/ML ~~LOC~~ SOLN
0.0000 [IU] | SUBCUTANEOUS | Status: DC
  Administered 2014-03-16: 2 [IU] via SUBCUTANEOUS
  Administered 2014-03-16 (×2): 5 [IU] via SUBCUTANEOUS
  Administered 2014-03-17: 1 [IU] via SUBCUTANEOUS
  Administered 2014-03-17: 2 [IU] via SUBCUTANEOUS

## 2014-03-16 MED ORDER — PREGABALIN 75 MG PO CAPS
75.0000 mg | ORAL_CAPSULE | Freq: Three times a day (TID) | ORAL | Status: DC
  Administered 2014-03-16 – 2014-03-21 (×15): 75 mg via ORAL
  Filled 2014-03-16 (×22): qty 1

## 2014-03-16 MED ORDER — FENTANYL CITRATE 0.05 MG/ML IJ SOLN
100.0000 ug | INTRAMUSCULAR | Status: DC | PRN
  Administered 2014-03-16 (×2): 100 ug via INTRAVENOUS
  Filled 2014-03-16 (×2): qty 2

## 2014-03-16 MED ORDER — MIDODRINE HCL 5 MG PO TABS
5.0000 mg | ORAL_TABLET | Freq: Two times a day (BID) | ORAL | Status: DC
  Administered 2014-03-17 – 2014-03-19 (×5): 5 mg via ORAL
  Filled 2014-03-16 (×8): qty 1

## 2014-03-16 MED ORDER — PANTOPRAZOLE SODIUM 40 MG IV SOLR
40.0000 mg | Freq: Every day | INTRAVENOUS | Status: DC
  Administered 2014-03-16 – 2014-03-20 (×5): 40 mg via INTRAVENOUS
  Filled 2014-03-16 (×6): qty 40

## 2014-03-16 MED ORDER — SODIUM CHLORIDE 0.9 % IV SOLN
500.0000 mg | Freq: Three times a day (TID) | INTRAVENOUS | Status: DC
  Administered 2014-03-17 – 2014-03-21 (×14): 500 mg via INTRAVENOUS
  Filled 2014-03-16 (×17): qty 500

## 2014-03-16 MED ORDER — SODIUM CHLORIDE 0.9 % IV SOLN: 250.0000 mL | INTRAVENOUS | Status: DC | PRN

## 2014-03-16 MED ORDER — VANCOMYCIN HCL IN DEXTROSE 1-5 GM/200ML-% IV SOLN
1000.0000 mg | INTRAVENOUS | Status: AC
  Administered 2014-03-16: 1000 mg via INTRAVENOUS
  Filled 2014-03-16: qty 200

## 2014-03-16 NOTE — Telephone Encounter (Signed)
Father wants you to know  pt is being transported to [facility] w/ double pneumonia at this moment. . Pt is still unresponsive.

## 2014-03-16 NOTE — H&P (Signed)
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 Was intubated via EMS. CXR was suspicious for bilateral PNA. He was transferred to [facility] for ICU care under PCCM direction.   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 02/05/2014 - Discharged from [facility] to rehab 5/29 - Discharged from rehab to home 6/9 CT head - no acute intracranial process, chronic changes 6/9 - Transferred to [facility] ICU for altered mental status.    LINES / TUBES: OETT 6/9 >>>  Foley ? >>>  CULTURES: Blood 6/9 >>> Urine 6/9 >>> Sputum 6/9 >>>  ANTIBIOTICS: Aztreonam 6/9 >>> Vancomycin 6/9 >>>  HISTORY OF PRESENT ILLNESS:  28 year old male with long standing history of IDDM, neuropathy, and gastroparesis. He has been taking significant doses of morphine, percocet, and is on lyrica for neuropathy for some time. 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 He was discharged to rehab 5/1 and was progressing well there, although course was complicated by UTI. He remains confined to powerchair, but has adequate use of upper extremities which allows him to operate the chair and feed himself. Family states that his sensory and movement deficits have been slowly improving. He was discharged to home 5/29, where he was described to be in his new usual state of health. He has no cognitive impairment or difficult swallowing per family. He has, however, continued to struggle with glucose control  and had several episodes of hypoglycemia since being home. 6/8 he was febrile and developed cough productive for brown sputum. 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 He also refuses to use IS and FV. 6/9 he was able to take medications in the morning, but shortly after became unresponsive, mother states he did not respond to sternal rub so she called EMS. He was intubated in route to [facility] ED. In ED he was found to have leukocytosis and CXR was suspicious for bilateral PNA. He was transferred to [facility] for ICU care.   PAST MEDICAL HISTORY :  Past Medical History  Diagnosis Date  . Diabetes mellitus   . GERD (gastroesophageal reflux disease)   . Asthma   . Hx MRSA infection     on face  . Gastroparesis   . Diabetic neuropathy   . Seizures    Past Surgical History  Procedure Laterality Date  . Tonsillectomy     Prior to Admission medications   Medication Sig Start Date End Date Taking? Authorizing Provider  ciprofloxacin (CIPRO) 250 MG tablet Take 1 tablet (250 mg total) by mouth 2 (two) times daily. 03/05/14   Bary Leriche, PA-C  dexlansoprazole (DEXILANT) 60 MG capsule Take 1 capsule (60 mg total) by mouth daily. 12/24/13   Jerene Bears, MD  fluconazole (DIFLUCAN) 100 MG tablet Take 1 tablet (100 mg total) by mouth daily. 03/05/14   Ivan Anchors Love, PA-C  insulin aspart (NOVOLOG) 100 UNIT/ML injection Inject 1-17 Units into the skin 4 (  four) times daily. Per sliding scale 03/05/14   Ivan Anchors Love, PA-C  insulin glargine (LANTUS) 100 UNIT/ML injection Inject 0.3 mLs (30 Units total) into the skin daily. 03/05/14   Ivan Anchors Love, PA-C  lidocaine (LIDODERM) 5 % Place on sacral area at 7 am and remove at 7 pm daily. Patch has to be off at least 12 hours in between applications. 03/05/14   Ivan Anchors Love, PA-C  lidocaine (XYLOCAINE) 2 % jelly Apply topically 4 (four) times daily as needed. To abrasion prn pain.  03/05/14   Bary Leriche, PA-C  loratadine (CLARITIN) 10 MG tablet Take 1 tablet (10 mg total) by mouth daily. 03/05/14   Bary Leriche, PA-C  methocarbamol (ROBAXIN) 750 MG tablet Take 1 tablet (750 mg total) by mouth 4 (four) times daily as needed for muscle spasms. 03/05/14   Bary Leriche, PA-C  metoCLOPramide (REGLAN) 5 MG tablet Take 1 tablet (5 mg total) by mouth daily with breakfast. 03/05/14   Ivan Anchors Love, PA-C  midodrine (PROAMATINE) 5 MG tablet Take 1 tablet (5 mg total) by mouth 2 (two) times daily with a meal. To help maintain blood pressure 03/05/14   Ivan Anchors Love, PA-C  Morphine Sulfate 40 MG CP24 Take 40 mg by mouth every 12 (twelve) hours. 03/05/14   Bary Leriche, PA-C  Omeprazole-Sodium Bicarbonate (ZEGERID OTC) 20-1100 MG CAPS Take 1 capsule by mouth at bedtime as needed (for heartburn when out of Dexilant).     Historical Provider, MD  ondansetron (ZOFRAN) 8 MG tablet Take 1 tablet (8 mg total) by mouth every 12 (twelve) hours as needed for nausea. 07/02/13   Costin Karlyne Greenspan, MD  oxyCODONE 10 MG TABS Take 0.5-1 tablets (5-10 mg total) by mouth every 6 (six) hours as needed for severe pain. 03/05/14   Bary Leriche, PA-C  pregabalin (LYRICA) 75 MG capsule Take 1 capsule (75 mg total) by mouth 3 (three) times daily. 03/05/14   Bary Leriche, PA-C  saccharomyces boulardii (FLORASTOR) 250 MG capsule Take 1 capsule (250 mg total) by mouth 2 (two) times daily. 03/05/14   Bary Leriche, PA-C  silver sulfADIAZINE (SILVADENE) 1 % cream Apply topically daily. 12/25/13   Kinnie Feil, MD  simethicone (MYLICON) 80 MG chewable tablet Chew 2 tablets (160 mg total) by mouth 4 (four) times daily as needed for flatulence. 03/05/14   Bary Leriche, PA-C   Allergies  Allergen Reactions  . Cefuroxime Axetil Anaphylaxis  . Penicillins Anaphylaxis    ?can take amoxicillin?  [name] [Benzonatate] Anaphylaxis  . Shellfish Allergy Itching    FAMILY HISTORY:  Family History  Problem Relation Age  of Onset  . Diabetes Father   . Hypertension Father   . Asthma      fhx  . Hypertension      fhx  . Stroke      fhx  . Heart disease Mother    SOCIAL HISTORY:  reports that he has been smoking Cigars.  He has never used smokeless tobacco. He reports that he drinks alcohol. He reports that he does not use illicit drugs.  REVIEW OF SYSTEMS:  Unable as pt is sedated on vent.   SUBJECTIVE:   VITAL SIGNS: Temp:  [98.4 F (36.9 C)] 98.4 F (36.9 C) (06/09 1634) Pulse Rate:  [91-92] 91 (06/09 1700) Resp:  [21] 21 (06/09 1700) BP: (125-127)/(83-85) 127/85 mmHg (06/09 1700) SpO2:  [100 %] 100 % (06/09 1700) FiO2 (%):  [  50 %] 50 % (06/09 1600) Weight:  [56.8 kg (125 lb 3.5 oz)] 56.8 kg (125 lb 3.5 oz) (06/09 1600) HEMODYNAMICS:   VENTILATOR SETTINGS: Vent Mode:  [-] PRVC FiO2 (%):  [50 %] 50 % Set Rate:  [22 bmp] 22 bmp Vt Set:  [350 mL] 350 mL INTAKE / OUTPUT: Intake/Output   None     PHYSICAL EXAMINATION: General:  Young male of normal body habitus on ventilator Neuro:  Sedated on vent HEENT:  [place]/AT, PERRL, ETT in place Cardiovascular:  RRR, no m/r/g Lungs:  Diffuse rhonchi Abdomen:  Soft, non-tender, non-distended Musculoskeletal:  No acute deformity Skin:  Diabetic ulcers to feet  LABS:  CBC No results found for this basename: WBC, HGB, HCT, PLT,  in the last 168 hours Coag's No results found for this basename: APTT, INR,  in the last 168 hours BMET No results found for this basename: NA, K, CL, CO2, BUN, CREATININE, GLUCOSE,  in the last 168 hours Electrolytes No results found for this basename: CALCIUM, MG, PHOS,  in the last 168 hours Sepsis Markers No results found for this basename: LATICACIDVEN, PROCALCITON, O2SATVEN,  in the last 168 hours ABG No results found for this basename: PHART, PCO2ART, PO2ART,  in the last 168 hours Liver Enzymes No results found for this basename: AST, ALT, ALKPHOS, BILITOT, ALBUMIN,  in the last 168 hours Cardiac  Enzymes No results found for this basename: TROPONINI, PROBNP,  in the last 168 hours Glucose  Recent Labs Lab 03/16/14 1630  GLUCAP 226*    Imaging No results found.   CXR: Bilateral alveolar filling processes likely related to PNA.  ETT placed appropriately.   ASSESSMENT / PLAN:  PULMONARY A: Acute respiratory failure - Sepsis 2nd to PNA, narcotic medication regimen, and chronic hypoventilation all likely play role.  Bilateral HCAP vs Aspiration recently released from rehabilitation facility Chronic hypoventilation? In setting of diaphragmatic involvement of c-spine infarct Respiratory Acidosis  P:   Full ventilatory support Follow CXR, ABG Daily SBT Pulmonary hygeine Encourage IS once extubated  CARDIOVASCULAR A:  H/o orthostatic hypotension - on midodrine at home, currently normotensive  P:  Continue midodrine Cardiac and BP monitoring  RENAL A:   No acute issues  P:   Follow BMP  GASTROINTESTINAL A:   Diabetic gastroparesis GERD  P:   PPI NPO Swallow eval post-extubation Holding home reglan/zofran  HEMATOLOGIC A:   Chronic Anemia Leukocytosis  P:  Follow CBC Enoxaparin for VTE ppx  INFECTIOUS A:   SIRS/Sepsis suspicion for pulmonary source (HCAP vs aspiration) but cannot r/o urinary. Recent UTI on cipro and fluconazole from rehab  P:   Antibiotic and cultures as above Trend procalcitonin Follow WBC and fever curve  ENDOCRINE A:   IDDM Diabetic foot ulcers  P:   Lantus CBG checks with SSI Wound care consult  NEUROLOGIC A:   Paraplegia s/p spinal cord infarct Diabetic neuropathy -  Takes 60mg  morphine and percocet daily for this.  Acute encephalopathy due to sepsis, sedation  P:   RASS goal: -1 PRN fentanyl for sedation/analgesia Continue lyrica Daily WUA   Georgann Housekeeper, ACNP [place] [facility] Pager [phone] or [phone]  Updated mom , fiance at bedside   I have personally obtained a  history, examined the patient, evaluated laboratory and imaging results, formulated the assessment and plan and placed orders. CRITICAL CARE: The patient is critically ill with multiple organ systems failure and requires high complexity decision making for assessment and support, frequent evaluation  and titration of therapies, application of advanced monitoring technologies and extensive interpretation of multiple databases. Critical Care Time devoted to patient care services described in this note is 60  minutes.   Kara Mead MD. Shade Flood. [place] [facility] Pager [phone] If no response call [phone]

## 2014-03-16 NOTE — Progress Notes (Signed)
ANTIBIOTIC CONSULT NOTE - INITIAL  Pharmacy Consult for Aztreonam/Vanco Indication: HCAP  Allergies  Allergen Reactions  . Cefuroxime Axetil Anaphylaxis  . Penicillins Anaphylaxis    ?can take amoxicillin?  [name] [Benzonatate] Anaphylaxis  . Shellfish Allergy Itching    Patient Measurements: Height: 5\' 8"  (172.7 cm) Weight: 125 lb 3.5 oz (56.8 kg) IBW/kg (Calculated) : 68.4 Adjusted Body Weight:   Vital Signs: Temp: 98.4 F (36.9 C) (06/09 1634) Temp src: Oral (06/09 1634) BP: 125/83 mmHg (06/09 1600) Pulse Rate: 92 (06/09 1600) Intake/Output from previous day:   Intake/Output from this shift:    Labs: No results found for this basename: WBC, HGB, PLT, LABCREA, CREATININE,  in the last 72 hours Estimated Creatinine Clearance: 91.2 ml/min (by C-G formula based on Cr of 0.96). No results found for this basename: Letta Median, VANCORANDOM, GENTTROUGH, GENTPEAK, GENTRANDOM, TOBRATROUGH, TOBRAPEAK, TOBRARND, AMIKACINPEAK, AMIKACINTROU, AMIKACIN,  in the last 72 hours   Microbiology: Recent Results (from the past 720 hour(s))  URINE CULTURE     Status: None   Collection Time    02/17/14  2:31 PM      Result Value Ref Range Status   Specimen Description URINE, CATHETERIZED   Final   Special Requests NONE   Final   Culture  Setup Time     Final   Value: 02/17/2014 19:11     Performed at [place]     Final   Value: NO GROWTH     Performed at [facility]   Culture     Final   Value: NO GROWTH     Performed at [facility]   Report Status 02/18/2014 FINAL   Final  URINE CULTURE     Status: None   Collection Time    02/20/14  1:58 PM      Result Value Ref Range Status   Specimen Description URINE, CATHETERIZED   Final   Special Requests NONE   Final   Culture  Setup Time     Final   Value: 02/20/2014 21:18     Performed at [place]     Final   Value: NO GROWTH     Performed at  [facility]   Culture     Final   Value: NO GROWTH     Performed at [facility]   Report Status 02/22/2014 FINAL   Final  URINE CULTURE     Status: None   Collection Time    03/04/14  1:14 PM      Result Value Ref Range Status   Specimen Description URINE, CATHETERIZED   Final   Special Requests NONE   Final   Culture  Setup Time     Final   Value: 03/04/2014 17:04     Performed at [place]     Final   Value: 60,000 COLONIES/ML     Performed at [facility]   Culture     Final   Value: ENTEROCOCCUS SPECIES     Performed at [facility]   Report Status 03/07/2014 FINAL   Final   Organism ID, Bacteria ENTEROCOCCUS SPECIES   Final  CLOSTRIDIUM DIFFICILE BY PCR     Status: None   Collection Time    03/04/14  1:15 PM      Result Value Ref Range Status   C difficile by pcr NEGATIVE  NEGATIVE  Final    Medical History: Past Medical History  Diagnosis Date  . Diabetes mellitus   . GERD (gastroesophageal reflux disease)   . Asthma   . Hx MRSA infection     on face  . Gastroparesis   . Diabetic neuropathy   . Seizures     Medications:  Prescriptions prior to admission  Medication Sig Dispense Refill  . ciprofloxacin (CIPRO) 250 MG tablet Take 1 tablet (250 mg total) by mouth 2 (two) times daily.  14 tablet  0  . dexlansoprazole (DEXILANT) 60 MG capsule Take 1 capsule (60 mg total) by mouth daily.  30 capsule  3  . fluconazole (DIFLUCAN) 100 MG tablet Take 1 tablet (100 mg total) by mouth daily.  5 tablet  0  . insulin aspart (NOVOLOG) 100 UNIT/ML injection Inject 1-17 Units into the skin 4 (four) times daily. Per sliding scale  10 mL  11  . insulin glargine (LANTUS) 100 UNIT/ML injection Inject 0.3 mLs (30 Units total) into the skin daily.  10 mL  11  . lidocaine (LIDODERM) 5 % Place on sacral area at 7 am and remove at 7 pm daily. Patch has to be off at least 12 hours in between applications.  30 patch  1  .  lidocaine (XYLOCAINE) 2 % jelly Apply topically 4 (four) times daily as needed. To abrasion prn pain.  30 mL  0  . loratadine (CLARITIN) 10 MG tablet Take 1 tablet (10 mg total) by mouth daily.  30 tablet  1  . methocarbamol (ROBAXIN) 750 MG tablet Take 1 tablet (750 mg total) by mouth 4 (four) times daily as needed for muscle spasms.  120 tablet  1  . metoCLOPramide (REGLAN) 5 MG tablet Take 1 tablet (5 mg total) by mouth daily with breakfast.  5 tablet  1  . midodrine (PROAMATINE) 5 MG tablet Take 1 tablet (5 mg total) by mouth 2 (two) times daily with a meal. To help maintain blood pressure  60 tablet  1  . Morphine Sulfate 40 MG CP24 Take 40 mg by mouth every 12 (twelve) hours.  60 capsule  0  . Omeprazole-Sodium Bicarbonate (ZEGERID OTC) 20-1100 MG CAPS Take 1 capsule by mouth at bedtime as needed (for heartburn when out of Dexilant).       . ondansetron (ZOFRAN) 8 MG tablet Take 1 tablet (8 mg total) by mouth every 12 (twelve) hours as needed for nausea.  20 tablet  0  . oxyCODONE 10 MG TABS Take 0.5-1 tablets (5-10 mg total) by mouth every 6 (six) hours as needed for severe pain.  90 tablet  0  . pregabalin (LYRICA) 75 MG capsule Take 1 capsule (75 mg total) by mouth 3 (three) times daily.  90 capsule  1  . saccharomyces boulardii (FLORASTOR) 250 MG capsule Take 1 capsule (250 mg total) by mouth 2 (two) times daily.  60 capsule  1  . silver sulfADIAZINE (SILVADENE) 1 % cream Apply topically daily.  50 g  0  . simethicone (MYLICON) 80 MG chewable tablet Chew 2 tablets (160 mg total) by mouth 4 (four) times daily as needed for flatulence.  30 tablet  0   Assessment: PNA 28 y/o M brought from [facility] after being found unresponsive at home. 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 Intubated by EMS. Patient was recently discharged from [facility] rehab on 5/29. Patient developed fever, productive cough,  leukocytosis and was  suspicious for bilateral PNA.  PMH: DM, GERD, asthma, h/o MRSA, gastroparesis, diabetic neuropathy, seizures  Labs pending: Last Scr 0.96 on 03/05/14 with estimated CrCl 91.  Goal of Therapy:  Vancomycin trough level 15-20 mcg/ml  Plan:  Vancomycin 1g IV x 1 then 500mg  IV q8hr. Aztreonam 1g IV q8hr.   Jason [name] S. Alford Highland, PharmD, [facility] Clinical Staff Pharmacist Pager [phone]  Coralyn Pear Alford Highland 03/16/2014,5:04 PM

## 2014-03-16 NOTE — Progress Notes (Signed)
[place] Physician-Brief Progress Note Patient Name: Jason [name] DOB: 17-Dec-1985 MRN: [id number]  Date of Service  03/16/2014   HPI/Events of Note   Severe agfgitation nacr dep  eICU Interventions  Add fent Prn versed   Intervention Category Minor Interventions: Clinical assessment - ordering diagnostic tests  Raylene Miyamoto 03/16/2014, 7:43 PM

## 2014-03-17 ENCOUNTER — Inpatient Hospital Stay (HOSPITAL_COMMUNITY): Payer: Medicaid Other

## 2014-03-17 DIAGNOSIS — J189 Pneumonia, unspecified organism: Secondary | ICD-10-CM

## 2014-03-17 DIAGNOSIS — J069 Acute upper respiratory infection, unspecified: Secondary | ICD-10-CM

## 2014-03-17 LAB — BASIC METABOLIC PANEL
BUN: 19 mg/dL (ref 6–23)
CALCIUM: 10.1 mg/dL (ref 8.4–10.5)
CO2: 20 mEq/L (ref 19–32)
CREATININE: 0.71 mg/dL (ref 0.50–1.35)
Chloride: 105 mEq/L (ref 96–112)
GFR calc Af Amer: >90 mL/min (ref >90)
GFR calc non Af Amer: >90 mL/min (ref >90)
Glucose, Bld: 99 mg/dL (ref 70–99)
Potassium: 4.1 mEq/L (ref 3.7–5.3)
Sodium: 139 mEq/L (ref 137–147)

## 2014-03-17 LAB — URINE CULTURE
COLONY COUNT: NO GROWTH
Culture: NO GROWTH
Special Requests: NORMAL

## 2014-03-17 LAB — CBC
HEMATOCRIT: 25.5 % — AB (ref 39.0–52.0)
Hemoglobin: 8.1 g/dL — ABNORMAL LOW (ref 13.0–17.0)
MCH: 25.3 pg — AB (ref 26.0–34.0)
MCHC: 31.8 g/dL (ref 30.0–36.0)
MCV: 79.7 fL (ref 78.0–100.0)
Platelets: 482 10*3/uL — ABNORMAL HIGH (ref 150–400)
RBC: 3.2 MIL/uL — AB (ref 4.22–5.81)
RDW: 16.2 % — ABNORMAL HIGH (ref 11.5–15.5)
WBC: 25.3 10*3/uL — ABNORMAL HIGH (ref 4.0–10.5)

## 2014-03-17 LAB — MAGNESIUM: Magnesium: 1.4 mg/dL — ABNORMAL LOW (ref 1.5–2.5)

## 2014-03-17 LAB — PROCALCITONIN: PROCALCITONIN: 9.81 ng/mL

## 2014-03-17 LAB — POCT I-STAT 3, ART BLOOD GAS (G3+)
Acid-base deficit: 27 mmol/L — ABNORMAL HIGH (ref 0.0–2.0)
Bicarbonate: 4.8 mEq/L — ABNORMAL LOW (ref 20.0–24.0)
O2 Saturation: 46 %
Patient temperature: 21.09
TCO2: 5 mmol/L (ref 0–100)
pCO2 arterial: <12 mmHg — CL (ref 35.0–45.0)
pH, Arterial: 7.097 — CL (ref 7.350–7.450)
pO2, Arterial: 13 mmHg — CL (ref 80.0–100.0)

## 2014-03-17 LAB — GLUCOSE, CAPILLARY
GLUCOSE-CAPILLARY: 85 mg/dL (ref 70–99)
Glucose-Capillary: 116 mg/dL — ABNORMAL HIGH (ref 70–99)
Glucose-Capillary: 124 mg/dL — ABNORMAL HIGH (ref 70–99)
Glucose-Capillary: 124 mg/dL — ABNORMAL HIGH (ref 70–99)
Glucose-Capillary: 143 mg/dL — ABNORMAL HIGH (ref 70–99)

## 2014-03-17 LAB — PHOSPHORUS: Phosphorus: 4.4 mg/dL (ref 2.3–4.6)

## 2014-03-17 MED ORDER — INSULIN ASPART 100 UNIT/ML ~~LOC~~ SOLN: 0.0000 [IU] | Freq: Three times a day (TID) | SUBCUTANEOUS | Status: DC

## 2014-03-17 MED ORDER — METHOCARBAMOL 750 MG PO TABS
750.0000 mg | ORAL_TABLET | Freq: Four times a day (QID) | ORAL | Status: DC | PRN
  Administered 2014-03-17 – 2014-03-20 (×6): 750 mg via ORAL
  Filled 2014-03-17 (×5): qty 1

## 2014-03-17 MED ORDER — MORPHINE SULFATE ER 15 MG PO TBCR
15.0000 mg | EXTENDED_RELEASE_TABLET | Freq: Two times a day (BID) | ORAL | Status: DC
  Administered 2014-03-17: 15 mg via ORAL
  Filled 2014-03-17: qty 1

## 2014-03-17 MED ORDER — OXYCODONE HCL 5 MG PO TABS
5.0000 mg | ORAL_TABLET | ORAL | Status: DC | PRN
  Administered 2014-03-17 – 2014-03-18 (×2): 5 mg via ORAL
  Filled 2014-03-17 (×2): qty 1

## 2014-03-17 MED ORDER — CHLORHEXIDINE GLUCONATE 0.12 % MT SOLN
OROMUCOSAL | Status: AC
  Filled 2014-03-17: qty 15

## 2014-03-17 MED ORDER — MORPHINE SULFATE ER 15 MG PO TBCR
15.0000 mg | EXTENDED_RELEASE_TABLET | Freq: Two times a day (BID) | ORAL | Status: DC
  Administered 2014-03-17 – 2014-03-21 (×8): 15 mg via ORAL
  Filled 2014-03-17 (×8): qty 1

## 2014-03-17 MED ORDER — FENTANYL CITRATE 0.05 MG/ML IJ SOLN: 25.0000 ug | INTRAMUSCULAR | Status: DC | PRN

## 2014-03-17 MED ORDER — CHLORHEXIDINE GLUCONATE 0.12 % MT SOLN
15.0000 mL | Freq: Two times a day (BID) | OROMUCOSAL | Status: DC
  Administered 2014-03-17 – 2014-03-21 (×8): 15 mL via OROMUCOSAL
  Filled 2014-03-17 (×10): qty 15

## 2014-03-17 NOTE — Progress Notes (Signed)
[place] Progress Note Patient Name: Jason [name] DOB: 1985-10-15 MRN: [id number]  Date of Service  03/17/2014   HPI/Events of Note   stols water  eICU Interventions  cdiff   Intervention Category Intermediate Interventions: Infection - evaluation and management  Raylene Miyamoto. 03/17/2014, 10:31 PM

## 2014-03-17 NOTE — Progress Notes (Signed)
[facility]  Patient Status: Active (receiving services up to time of hospitalization)  [facility] is providing the following services: RN  If patient discharges after hours, please call [phone].   Jason [name] 03/17/2014, 5:34 PM

## 2014-03-17 NOTE — Progress Notes (Signed)
UR Completed.  Nerea Bordenave Jane [phone] 03/17/2014  

## 2014-03-17 NOTE — Progress Notes (Addendum)
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 Was intubated via EMS. CXR was suspicious for bilateral PNA. He was transferred to [facility] for ICU care under PCCM direction. Extubated 6/10 after improvement in mental status.   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 02/05/2014 - Discharged from [facility] to rehab 5/29 - Discharged from rehab to home 6/9 - CT head - no acute intracranial process, chronic changes 6/9 - Transferred to [facility] ICU for altered mental status.  6/9 - Intubated 6/10 - Extubated, tolerated well   LINES / TUBES: OETT 6/9 >>> 6/10 Foley ? >>>  CULTURES: Blood 6/9 >>> Urine 6/9 >>> Sputum 6/9 >>>  ANTIBIOTICS: Aztreonam 6/9 >>> Vancomycin 6/9 >>>  SUBJECTIVE: Patient with improved mental status today, alert and cooperative with exam. He expresses that he is uncomfortable with the ETT. He will be extubated today 6/10.  VITAL SIGNS: Temp:  [98.4 F (36.9 C)-101.1 F (38.4 C)] 100.4 F (38 C) (06/10 0413) Pulse Rate:  [80-155] 88 (06/10 1110) Resp:  [12-32] 18 (06/10 1110) BP: (103-142)/(70-91) 113/72 mmHg (06/10 1100) SpO2:  [100 %] 100 % (06/10 1110) FiO2 (%):  [40 %-50 %] 40 % (06/10 0800) Weight:  [56.8 kg (125 lb 3.5 oz)-59.3 kg (130 lb 11.7 oz)] 59.3 kg (130 lb 11.7 oz) (06/10 0430) HEMODYNAMICS:   VENTILATOR SETTINGS: Vent Mode:  [-] CPAP;PSV FiO2 (%):  [40 %-50 %] 40 % Set Rate:  [16 bmp-22 bmp] 16 bmp Vt Set:  [350 mL-550 mL] 550  mL PEEP:  [5 cmH20] 5 cmH20 Pressure Support:  [5 cmH20] 5 cmH20 Plateau Pressure:  [18 cmH20-30 cmH20] 25 cmH20 INTAKE / OUTPUT: Intake/Output     06/09 0701 - 06/10 0700 06/10 0701 - 06/11 0700   I.V. (mL/kg) 981.8 (16.6) 262.4 (4.4)   IV Piggyback 400 150   Total Intake(mL/kg) 1381.8 (23.3) 412.4 (7)   Urine (mL/kg/hr) 1050 360 (1.3)   Total Output 1050 360   Net +331.8 +52.4         PHYSICAL EXAMINATION: General:  Young male of normal body habitus on ventilator. Paraplegic. Neuro:  Awake, alert, cooperative with exam. Generalized weakness, flaccid BLE, 0/5 strength. HEENT:  [place]/AT, PERRL. Cardiovascular:  RRR, no m/r/g Lungs:  Extubated today, now on 2L [place]. Breaths even and unlabored. Few crackles with rhonchi. Abdomen:  Soft, non-tender, non-distended. BS+ Musculoskeletal:  No acute deformity, flaccid BLE. Skin:  Diabetic ulcers to feet bilaterally.  LABS:  CBC  Recent Labs Lab 03/17/14 0240  WBC 25.3*  HGB 8.1*  HCT 25.5*  PLT 482*   Coag's No results found for this basename: APTT, INR,  in the last 168 hours BMET  Recent Labs Lab 03/17/14 0240  NA 139  K 4.1  CL 105  CO2 20  BUN 19  CREATININE 0.71  GLUCOSE 99   Electrolytes  Recent Labs Lab 03/17/14 0240  CALCIUM 10.1  MG 1.4*  PHOS 4.4   Sepsis Markers  Recent Labs Lab 03/16/14 1915 03/17/14 [place]  10.60 9.81   ABG  Recent Labs Lab 03/16/14 1858  PHART 7.097*  PCO2ART <12.0*  PO2ART 13.0*   Liver Enzymes No results found for this basename: AST, ALT, ALKPHOS, BILITOT, ALBUMIN,  in the last 168 hours Cardiac Enzymes No results found for this basename: TROPONINI, PROBNP,  in the last 168 hours Glucose  Recent Labs Lab 03/16/14 1630 03/16/14 1929 03/16/14 2339 03/17/14 0411 03/17/14 0737  GLUCAP 226* 229* 124* 124* 85    Imaging Dg Chest Port 1 View  03/17/2014   CLINICAL DATA:  Intubation.  EXAM: PORTABLE CHEST - 1 VIEW  COMPARISON:  Chest x-ray 03/16/2014  and 02/15/2010.  FINDINGS: Interim placement of NG tube, tube in good anatomic position. Endotracheal tube in good anatomic position. Bibasilar pulmonary infiltrates are again noted. These have improved slightly from prior exam. Heart size and pulmonary vascularity are stable. No pulmonary venous congestion. No pleural effusion or pneumothorax. Deformity of the distal clavicles again noted. These changes are stable from multiple prior exams. These changes may be related to prior trauma.  IMPRESSION: 1. Interim placement of NG tube, its tip is below the left hemidiaphragm. Endotracheal tube in stable position. 2. Persistent bilateral pulmonary alveolar infiltrates with slight improvement.   Electronically Signed   By: [place]   On: 03/17/2014 07:15   Dg Abd Portable 1v  03/16/2014   CLINICAL DATA:  Orogastric tube placement.  EXAM: PORTABLE ABDOMEN - 1 VIEW  COMPARISON:  02/05/2014  FINDINGS: Supine view of the abdomen and pelvis. Orogastric tube terminates at the gastric body. There is a rectal thermister. Non-obstructive bowel gas pattern. Probable vascular calcifications in the left hemipelvis.  IMPRESSION: Orogastric tube terminating at the body of the stomach.   Electronically Signed   By: Abigail Miyamoto M.D.   On: 03/16/2014 19:15   ASSESSMENT / PLAN:  PULMONARY A: Acute respiratory failure - Sepsis 2nd to PNA, narcotic medication regimen, and chronic hypoventilation all likely play role.  Bilateral HCAP vs Aspiration - recently released from rehabilitation facility Chronic hypoventilation? - In setting of diaphragmatic involvement of c-spine infarct (C4-T1) Respiratory Acidosis  P:   - Extubate today, continue supplementary O2 to keep SpO2 > 92% - Follow CXR, ABG - Pulmonary hygiene, encourage IS, flutter valve  CARDIOVASCULAR A:  H/o orthostatic hypotension - on midodrine at home, currently normotensive  P:  - Continue midodrine - Cardiac and BP monitoring  RENAL A:   No  acute issues  P:   - Follow BMet  GASTROINTESTINAL A:   Diabetic gastroparesis GERD  P:  - Restart diet as tolerated - PPI - Holding home Reglan/Zofran  HEMATOLOGIC A:   Chronic Anemia Leukocytosis  P:  - Follow CBC - Enoxaparin for VTE ppx  INFECTIOUS A:   SIRS/Sepsis suspicion for pulmonary source (HCAP vs aspiration) but cannot r/o urinary. Recent UTI on cipro and fluconazole from rehab  P:   - Abx/cultures as above - PCT trending down - Monitor for fever/leukocytosis  ENDOCRINE A:   IDDM  Diabetic foot ulcers  P:   - Lantus 10 daily for now - CBG checks with [facility] - Wound care consulted for ulcers>>>rec foam dressing for protection/healing promotion, absorb drainage to sacrum/R great toe/R elbow  NEUROLOGIC A:   Paraplegia s/p spinal cord infarct (C4-T1) Diabetic neuropathy -  Takes 60mg  morphine and percocet daily for this.  Acute encephalopathy due to sepsis, sedation  P:   - restart home Morphine at lower dose for now 15mg  po  q12h - PRN fentanyl  - Continue Lyrica  Lowella Dell. Ayesha Rumpf, PA-S   Attending:  I have seen and examined the patient with nurse practitioner/resident and agree with the note above.   CC time 35 minutes.  Roselie Awkward, MD [place] PCCM Pager: [phone] Cell: [phone] If no response, call [phone]

## 2014-03-17 NOTE — Consult Note (Signed)
WOC wound consult note Reason for Consult: Consult requested for bilat feet.  Pt familiar to [place] service from previous admission for feet and right elbow wounds. Wound type: Right elbow with chronic full thickness wound; 2.2X4X.1cm; 100% red and moist, mod amt yellow drainage, no odor.  Appearance improved since previous admission. Left great toe with previous full thickness wound which has healed at this time.  No open wound, odor, or drainage.  Dry peeling skin surrounding pink dry scar tissue.   Right great toe with full thickness wound; 1X.5X.1cm; 100% pink and moist, bleeds easily when touched, no odor.  Dry peeling skin surrounding scar tissue to right plantar foot. Sacrum with appearance consistent with healing stage 3 pressure ulcer; [facility] team was not consulted for this site during previous admission.  2.5X1.5X.2cm; 100% pink with small amt yellow drainage, no odor.  Pink dry scar tissue surrounding wound edges. Pressure Ulcer POA: Yes Dressing procedure/placement/frequency: Foam dressing to protect, promote healing, and absorb drainage to sacrum, right great toe, and right elbow. Please re-consult if further assistance is needed.  Thank-you,  Julien Girt MSN, [place], [place], [place], [place]

## 2014-03-17 NOTE — Progress Notes (Signed)
[place] Progress Note Patient Name: Jason [name] DOB: 01-26-1986 MRN: [id number]  Date of Service  03/17/2014   HPI/Events of Note   Ineffective fent  eICU Interventions  delet efent Add home oxy   Intervention Category Minor Interventions: Routine modifications to care plan (e.g. PRN medications for pain, fever)  Raylene Miyamoto. 03/17/2014, 7:50 PM

## 2014-03-17 NOTE — Procedures (Signed)
Extubation Procedure Note  Patient Details:   Name: Jason [name] DOB: [birth year] MRN: [id number]   Airway Documentation:     Evaluation  O2 sats: stable throughout Complications: No apparent complications Patient did tolerate procedure well. Bilateral Breath Sounds: Clear;Diminished Suctioning: Oral Yes pt able to vocalize.  Pt extubated at this time and tolerating well. No complications at this time. Pt was placed on 2L [place]. Pt able to breathe around deflated cuff. No Stridor noted. IS performed 500-727mLx8. Pt has adequate cough. VS stable. RT will continue to monitor.   Irineo Axon [facility] 03/17/2014, 11:20 AM

## 2014-03-18 ENCOUNTER — Ambulatory Visit: Payer: MEDICAID | Admitting: Neurology

## 2014-03-18 ENCOUNTER — Inpatient Hospital Stay (HOSPITAL_COMMUNITY): Payer: Medicaid Other

## 2014-03-18 DIAGNOSIS — E1149 Type 2 diabetes mellitus with other diabetic neurological complication: Secondary | ICD-10-CM

## 2014-03-18 DIAGNOSIS — K3184 Gastroparesis: Secondary | ICD-10-CM

## 2014-03-18 LAB — GLUCOSE, CAPILLARY
GLUCOSE-CAPILLARY: 445 mg/dL — AB (ref 70–99)
Glucose-Capillary: 141 mg/dL — ABNORMAL HIGH (ref 70–99)
Glucose-Capillary: 187 mg/dL — ABNORMAL HIGH (ref 70–99)
Glucose-Capillary: 228 mg/dL — ABNORMAL HIGH (ref 70–99)
Glucose-Capillary: 290 mg/dL — ABNORMAL HIGH (ref 70–99)
Glucose-Capillary: 305 mg/dL — ABNORMAL HIGH (ref 70–99)
Glucose-Capillary: 491 mg/dL — ABNORMAL HIGH (ref 70–99)

## 2014-03-18 LAB — CBC
HEMATOCRIT: 27.3 % — AB (ref 39.0–52.0)
Hemoglobin: 8.6 g/dL — ABNORMAL LOW (ref 13.0–17.0)
MCH: 24.9 pg — ABNORMAL LOW (ref 26.0–34.0)
MCHC: 31.5 g/dL (ref 30.0–36.0)
MCV: 79.1 fL (ref 78.0–100.0)
Platelets: 502 10*3/uL — ABNORMAL HIGH (ref 150–400)
RBC: 3.45 MIL/uL — AB (ref 4.22–5.81)
RDW: 15.9 % — ABNORMAL HIGH (ref 11.5–15.5)
WBC: 23.9 10*3/uL — AB (ref 4.0–10.5)

## 2014-03-18 LAB — CULTURE, RESPIRATORY W GRAM STAIN

## 2014-03-18 LAB — BASIC METABOLIC PANEL
BUN: 16 mg/dL (ref 6–23)
CHLORIDE: 97 meq/L (ref 96–112)
CO2: 18 meq/L — AB (ref 19–32)
CREATININE: 0.71 mg/dL (ref 0.50–1.35)
Calcium: 10.4 mg/dL (ref 8.4–10.5)
GFR calc Af Amer: >90 mL/min (ref >90)
GFR calc non Af Amer: >90 mL/min (ref >90)
Glucose, Bld: 404 mg/dL — ABNORMAL HIGH (ref 70–99)
Potassium: 4.1 mEq/L (ref 3.7–5.3)
Sodium: 132 mEq/L — ABNORMAL LOW (ref 137–147)

## 2014-03-18 LAB — CLOSTRIDIUM DIFFICILE BY PCR: Toxigenic C. Difficile by PCR: NEGATIVE

## 2014-03-18 LAB — PROCALCITONIN: PROCALCITONIN: 5.34 ng/mL

## 2014-03-18 LAB — VANCOMYCIN, TROUGH: Vancomycin Tr: 17 ug/mL (ref 10.0–20.0)

## 2014-03-18 MED ORDER — INSULIN ASPART 100 UNIT/ML ~~LOC~~ SOLN
0.0000 [IU] | Freq: Three times a day (TID) | SUBCUTANEOUS | Status: DC
  Administered 2014-03-18: 3 [IU] via SUBCUTANEOUS
  Administered 2014-03-19: 7 [IU] via SUBCUTANEOUS
  Administered 2014-03-19: 5 [IU] via SUBCUTANEOUS

## 2014-03-18 MED ORDER — INSULIN GLARGINE 100 UNIT/ML ~~LOC~~ SOLN
20.0000 [IU] | Freq: Every day | SUBCUTANEOUS | Status: DC
  Administered 2014-03-18: 20 [IU] via SUBCUTANEOUS
  Filled 2014-03-18 (×2): qty 0.2

## 2014-03-18 MED ORDER — ALBUTEROL SULFATE HFA 108 (90 BASE) MCG/ACT IN AERS: 8.0000 | INHALATION_SPRAY | RESPIRATORY_TRACT | Status: DC | PRN

## 2014-03-18 MED ORDER — INSULIN ASPART 100 UNIT/ML ~~LOC~~ SOLN
3.0000 [IU] | Freq: Three times a day (TID) | SUBCUTANEOUS | Status: DC
  Administered 2014-03-18 – 2014-03-21 (×9): 3 [IU] via SUBCUTANEOUS

## 2014-03-18 MED ORDER — OXYCODONE HCL 5 MG PO TABS
10.0000 mg | ORAL_TABLET | ORAL | Status: DC | PRN
  Administered 2014-03-18 (×3): 10 mg via ORAL
  Administered 2014-03-18: 5 mg via ORAL
  Administered 2014-03-19 (×3): 10 mg via ORAL
  Filled 2014-03-18 (×7): qty 2

## 2014-03-18 MED ORDER — ALBUTEROL SULFATE (2.5 MG/3ML) 0.083% IN NEBU: 5.0000 mg | INHALATION_SOLUTION | RESPIRATORY_TRACT | Status: DC | PRN

## 2014-03-18 MED ORDER — LOPERAMIDE HCL 2 MG PO CAPS
2.0000 mg | ORAL_CAPSULE | ORAL | Status: DC | PRN
  Administered 2014-03-18: 2 mg via ORAL
  Filled 2014-03-18: qty 1

## 2014-03-18 MED ORDER — INSULIN ASPART 100 UNIT/ML ~~LOC~~ SOLN
0.0000 [IU] | SUBCUTANEOUS | Status: DC
  Administered 2014-03-18: 3 [IU] via SUBCUTANEOUS
  Administered 2014-03-18: 2 [IU] via SUBCUTANEOUS
  Administered 2014-03-18: 11 [IU] via SUBCUTANEOUS
  Administered 2014-03-18: 15 [IU] via SUBCUTANEOUS

## 2014-03-18 NOTE — Progress Notes (Signed)
Pt has not yet voided since foley d/c'd earlier today. Pt states feels uncomfortably full. Bladder scanned > 300 cc in bladder. Will straight cath and continue to monitor

## 2014-03-18 NOTE — Progress Notes (Addendum)
eLink Physician-Brief Progress Note Patient Name: Jason [name] DOB: Oct 23, 1985 MRN: [id number]  Date of Service  03/18/2014   HPI/Events of Note   Hyperglycemic  Pain not controlled  eICU Interventions  Change sliiding scale from AC/HS to q4h  Increase from 5mg  to 10mg  oxy q4h prn   Intervention Category Intermediate Interventions: Hyperglycemia - evaluation and treatment  Jaynie Hitch 03/18/2014, 12:09 AM

## 2014-03-18 NOTE — Progress Notes (Signed)
ANTIBIOTIC CONSULT NOTE - FOLLOW UP  Pharmacy Consult for Vancomycin  Indication: r/o HCAP  Labs:  Recent Labs  03/17/14 0240 03/18/14 0210  WBC 25.3* 23.9*  HGB 8.1* 8.6*  PLT 482* 502*  CREATININE 0.71  --    Recent Labs  03/18/14 0210  VANCOTROUGH 17.0    Assessment: 28 y/o M on vancomycin for r/o HCAP, VT is 17, other labs as above.   Goal of Therapy:  Vancomycin trough level 15-20 mcg/ml  Plan:  -Continue vancomycin 500 mg IV q8h -Trend WBC, temp, renal function  -F/U any cultures, imaging -Repeat VT as needed  Narda Bonds 03/18/2014,3:07 AM

## 2014-03-18 NOTE — Progress Notes (Signed)
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 Was intubated via EMS. CXR was suspicious for bilateral PNA. He was transferred to [facility] for ICU care under PCCM direction. Extubated 6/10 after improvement in mental status. Plan to transfer to SDU, [facility] notified.  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 02/05/2014 - Discharged from [facility] to rehab 5/29 - Discharged from rehab to home 6/9 - CT head - no acute intracranial process, chronic changes 6/9 - Transferred to [facility] ICU for altered mental status.  6/9 - Intubated 6/10 - Extubated, tolerated well 6/11 - Plan for transfer to SDU  LINES / TUBES: OETT 6/9 >>> 6/10 Foley ? >>>  CULTURES: ?Blood 6/9 >>> Urine 6/9 >>>No growth Sputum 6/9 >>>Few candida C. Diff by PCR 6/11>>>negative  ANTIBIOTICS: Aztreonam 6/9 >>> Vancomycin 6/9 >>>  SUBJECTIVE: 6/11 Overnight, patient had an episode of hyperglycemia and his frequency of CBG checks was changed to Q4.  Most significant event overnight was lack of pain control. Patient states at this time that he is uncomfortable and that his pain is not well-controlled.  He endorses some difficulty breathing and describes his chest as "rattling."  Feels "about the same" as yesterday. He also has been having frequent diarrhea, which he states has been common since his spinal injury.  C. Diff by PCR negative.  VITAL SIGNS: Temp:  [98.2 F (36.8 C)-99.7 F (37.6 C)] 98.9 F (37.2 C)  (06/11 0429) Pulse Rate:  [79-155] 92 (06/11 0600) Resp:  [12-32] 28 (06/11 0600) BP: (103-161)/(72-102) 125/81 mmHg (06/11 0600) SpO2:  [97 %-100 %] 100 % (06/11 0600) FiO2 (%):  [40 %] 40 % (06/10 0800) Weight:  [56.6 kg (124 lb 12.5 oz)] 56.6 kg (124 lb 12.5 oz) (06/11 0600)  HEMODYNAMICS:   VENTILATOR SETTINGS: Vent Mode:  [-] CPAP;PSV FiO2 (%):  [40 %] 40 % PEEP:  [5 cmH20] 5 cmH20 Pressure Support:  [5 cmH20] 5 cmH20 INTAKE / OUTPUT: Intake/Output     06/10 0701 - 06/11 0700 06/11 0701 - 06/12 0700   P.O. 680    I.V. (mL/kg) 1137.4 (20.1)    IV Piggyback 450    Total Intake(mL/kg) 2267.4 (40.1)    Urine (mL/kg/hr) 3100 (2.3)    Stool 800 (0.6)    Total Output 3900     Net -1632.6          Stool Occurrence 1 x     PHYSICAL EXAMINATION: General:  Young male of normal body habitus. Paraplegic. Neuro:  Awake, slightly drowsy, cooperative with exam. Generalized weakness, flaccid BLE, 0/5 strength. HEENT:  [place]/AT, PERRL. Cardiovascular:  RRR, no m/r/g Lungs: Breaths even and unlabored on 2L [place]. Coarse rhonchi bilaterally. Weak cough, productive of brown sputum. Abdomen:  Soft, non-tender, non-distended. BS+ Musculoskeletal:  No acute deformity, flaccid BLE. Skin:  Diabetic ulcers to feet bilaterally.  LABS:  CBC  Recent Labs Lab 03/17/14 0240 03/18/14 0210  WBC 25.3* 23.9*  HGB 8.1* 8.6*  HCT 25.5* 27.3*  PLT 482* 502*   Coag's  No results found for this basename: APTT, INR,  in the last 168 hours BMET  Recent Labs Lab 03/17/14 0240 03/18/14 0210  NA 139 132*  K 4.1 4.1  CL 105 97  CO2 20 18*  BUN 19 16  CREATININE 0.71 0.71  GLUCOSE 99 404*   Electrolytes  Recent Labs Lab 03/17/14 0240 03/18/14 0210  CALCIUM 10.1 10.4  MG 1.4*  --   PHOS 4.4  --    Sepsis Markers  Recent Labs Lab 03/16/14 1915 03/17/14 0240 03/18/14 0210  PROCALCITON 10.60 9.81 5.34   ABG  Recent Labs Lab 03/16/14 1858  PHART 7.097*  PCO2ART <12.0*  PO2ART  13.0*   Liver Enzymes No results found for this basename: AST, ALT, ALKPHOS, BILITOT, ALBUMIN,  in the last 168 hours Cardiac Enzymes No results found for this basename: TROPONINI, PROBNP,  in the last 168 hours Glucose  Recent Labs Lab 03/17/14 0411 03/17/14 0737 03/17/14 1230 03/17/14 1512 03/17/14 2350 03/18/14 0429  GLUCAP 124* 85 143* 116* 491* 305*    Imaging Dg Chest Port 1 View  03/17/2014   CLINICAL DATA:  Intubation.  EXAM: PORTABLE CHEST - 1 VIEW  COMPARISON:  Chest x-ray 03/16/2014 and 02/15/2010.  FINDINGS: Interim placement of NG tube, tube in good anatomic position. Endotracheal tube in good anatomic position. Bibasilar pulmonary infiltrates are again noted. These have improved slightly from prior exam. Heart size and pulmonary vascularity are stable. No pulmonary venous congestion. No pleural effusion or pneumothorax. Deformity of the distal clavicles again noted. These changes are stable from multiple prior exams. These changes may be related to prior trauma.  IMPRESSION: 1. Interim placement of NG tube, its tip is below the left hemidiaphragm. Endotracheal tube in stable position. 2. Persistent bilateral pulmonary alveolar infiltrates with slight improvement.   Electronically Signed   By: [place]   On: 03/17/2014 07:15   Dg Abd Portable 1v  03/16/2014   CLINICAL DATA:  Orogastric tube placement.  EXAM: PORTABLE ABDOMEN - 1 VIEW  COMPARISON:  02/05/2014  FINDINGS: Supine view of the abdomen and pelvis. Orogastric tube terminates at the gastric body. There is a rectal thermister. Non-obstructive bowel gas pattern. Probable vascular calcifications in the left hemipelvis.  IMPRESSION: Orogastric tube terminating at the body of the stomach.   Electronically Signed   By: Abigail Miyamoto M.D.   On: 03/16/2014 19:15   ASSESSMENT / PLAN:  PULMONARY A: Acute respiratory failure - off vent but still with poor pulmonary toilette  Bilateral HCAP vs Aspiration - recently  released from rehabilitation facility Chronic hypoventilation? - In setting of diaphragmatic involvement of c-spine infarct (C4-T1)  P:   - Continue supplementary O2 to keep SpO2 > 92% - Follow CXR - Chest PT vest starts today - Pulmonary hygiene, encourage IS, flutter valve  CARDIOVASCULAR A:  H/o orthostatic hypotension - on midodrine at home, currently normotensive  P:  - Continue midodrine - Cardiac and BP monitoring  RENAL A:   No acute issues  P:   - Follow BMet  GASTROINTESTINAL A:   Diabetic gastroparesis GERD ?C.diff, culture pending  P:  - Carb-modified diet - Flexi-seal in place for freq diarrhea, C. Diff by PCR negative - Imodium PRN - PPI - Holding home Reglan/Zofran  HEMATOLOGIC A:   Chronic Anemia Leukocytosis  P:  - Follow CBC - Enoxaparin for VTE ppx  INFECTIOUS A:   SIRS/Sepsis suspicion for pulmonary source (HCAP vs aspiration) but cannot r/o urinary. Recent UTI on  cipro and fluconazole from rehab  P:   - Abx/cultures as above - PCT trending down - Monitor for fever/leukocytosis  ENDOCRINE A:   IDDM Diabetic foot ulcers  P:   - Lantus 20 units QHS - Add meal coverage - Q4 CBG checks with [facility] (sensitive) - Wound care consulted for ulcers>>>rec foam dressing for protection/healing promotion, absorb drainage to sacrum/R great toe/R elbow  NEUROLOGIC A:   Paraplegia s/p spinal cord infarct (C4-T1) Diabetic neuropathy -  Takes 60mg  morphine and percocet daily for this.  Acute encephalopathy due to sepsis, sedation  P:   - Home Morphine at lower dose for now 15mg  po Q12 - Oxycodone 10mg  po Q4 PRN - D/c fentanyl - Continue Lyrica  Plan to transfer to stepdown, [facility] notified.  Lowella Dell. Ayesha Rumpf, PA-S   Attending:  I have seen and examined the patient with nurse practitioner/resident and agree with the note above.   Start chest PT vest Continue abx Transfer to sdu, PCCM off  Roselie Awkward, MD [name] PCCM Pager:  [phone] Cell: [phone] If no response, call [phone]

## 2014-03-18 NOTE — Progress Notes (Addendum)
Inpatient Diabetes Program Recommendations  AACE/ADA: New Consensus Statement on Inpatient Glycemic Control (2013)  Target Ranges:  Prepandial:   less than 140 mg/dL      Peak postprandial:   less than 180 mg/dL (1-2 hours)      Critically ill patients:  140 - 180 mg/dL   Reason for Assessment:  Results for Jason [name], Jason [name] (MRN [id number]) as of 03/18/2014 11:43  Ref. Range 03/17/2014 15:12 03/17/2014 23:50 03/18/2014 04:29 03/18/2014 07:54  Glucose-Capillary Latest Range: 70-99 mg/dL 116 (H) 491 (H) 305 (H) 187 (H)    Diabetes history: Type 1 diabetes Outpatient Diabetes medications: Lantus 30 units daily and Novolog correction/meal coverage however note states that patient has started using his insulin pump again?? Current orders for Inpatient glycemic control: Please consider increasing Lantus to 12 units bid.  Also consider reducing Novolog correction to sensitive tid with meals and HS.  Will also need to add Novolog meal coverage 3 units tid with meals to cover CHO intake-Hold if patient eats less than 50%.    Will discuss home regimen with patient also.  Thanks, Adah Perl, RN, BC-ADM Inpatient Diabetes Coordinator Pager [phone] Spoke to patient regarding home diabetes regimen.  He states that he recently restarted his home insulin pump at the previous settings.  He states that his basal rate is 2 units/hr, he covers 1 unit for every 15 grams CHO, and that the correction factor is "programmed in the pump". He has seen Dr. Chalmers Cater in the past but plans to see Dr. Loanne Drilling for follow-up.

## 2014-03-18 NOTE — Progress Notes (Signed)
Pt refused CPT at this time. Pt stated he wanted to rest and that he would do CPT later.

## 2014-03-19 ENCOUNTER — Inpatient Hospital Stay (HOSPITAL_COMMUNITY): Payer: Medicaid Other

## 2014-03-19 DIAGNOSIS — J189 Pneumonia, unspecified organism: Secondary | ICD-10-CM | POA: Diagnosis present

## 2014-03-19 LAB — CBC
HEMATOCRIT: 28.2 % — AB (ref 39.0–52.0)
HEMOGLOBIN: 8.9 g/dL — AB (ref 13.0–17.0)
MCH: 25 pg — AB (ref 26.0–34.0)
MCHC: 31.6 g/dL (ref 30.0–36.0)
MCV: 79.2 fL (ref 78.0–100.0)
Platelets: 529 10*3/uL — ABNORMAL HIGH (ref 150–400)
RBC: 3.56 MIL/uL — AB (ref 4.22–5.81)
RDW: 15.6 % — ABNORMAL HIGH (ref 11.5–15.5)
WBC: 21.9 10*3/uL — ABNORMAL HIGH (ref 4.0–10.5)

## 2014-03-19 LAB — HEMOGLOBIN A1C
HEMOGLOBIN A1C: 8.5 % — AB (ref <5.7)
Mean Plasma Glucose: 197 mg/dL — ABNORMAL HIGH (ref <117)

## 2014-03-19 LAB — GLUCOSE, CAPILLARY
GLUCOSE-CAPILLARY: 210 mg/dL — AB (ref 70–99)
GLUCOSE-CAPILLARY: 449 mg/dL — AB (ref 70–99)
Glucose-Capillary: 285 mg/dL — ABNORMAL HIGH (ref 70–99)
Glucose-Capillary: 343 mg/dL — ABNORMAL HIGH (ref 70–99)
Glucose-Capillary: 345 mg/dL — ABNORMAL HIGH (ref 70–99)

## 2014-03-19 LAB — BASIC METABOLIC PANEL
BUN: 12 mg/dL (ref 6–23)
CALCIUM: 10.8 mg/dL — AB (ref 8.4–10.5)
CO2: 21 meq/L (ref 19–32)
CREATININE: 0.55 mg/dL (ref 0.50–1.35)
Chloride: 99 mEq/L (ref 96–112)
GFR calc Af Amer: >90 mL/min (ref >90)
GLUCOSE: 327 mg/dL — AB (ref 70–99)
Potassium: 4.4 mEq/L (ref 3.7–5.3)
SODIUM: 133 meq/L — AB (ref 137–147)

## 2014-03-19 MED ORDER — BIOTENE DRY MOUTH MT LIQD
15.0000 mL | Freq: Two times a day (BID) | OROMUCOSAL | Status: DC
  Administered 2014-03-19 – 2014-03-21 (×4): 15 mL via OROMUCOSAL

## 2014-03-19 MED ORDER — INSULIN ASPART 100 UNIT/ML ~~LOC~~ SOLN
0.0000 [IU] | Freq: Three times a day (TID) | SUBCUTANEOUS | Status: DC
  Administered 2014-03-19: 5 [IU] via SUBCUTANEOUS
  Administered 2014-03-20 (×2): 3 [IU] via SUBCUTANEOUS
  Administered 2014-03-20: 5 [IU] via SUBCUTANEOUS
  Administered 2014-03-21: 3 [IU] via SUBCUTANEOUS

## 2014-03-19 MED ORDER — CHLORHEXIDINE GLUCONATE 0.12 % MT SOLN
15.0000 mL | Freq: Two times a day (BID) | OROMUCOSAL | Status: DC
  Filled 2014-03-19 (×2): qty 15

## 2014-03-19 MED ORDER — INSULIN GLARGINE 100 UNIT/ML ~~LOC~~ SOLN
30.0000 [IU] | Freq: Every day | SUBCUTANEOUS | Status: DC
  Administered 2014-03-19 – 2014-03-20 (×2): 30 [IU] via SUBCUTANEOUS
  Filled 2014-03-19 (×3): qty 0.3

## 2014-03-19 MED ORDER — MIDODRINE HCL 5 MG PO TABS
5.0000 mg | ORAL_TABLET | Freq: Every day | ORAL | Status: DC
  Administered 2014-03-20 – 2014-03-21 (×2): 5 mg via ORAL
  Filled 2014-03-19 (×2): qty 1

## 2014-03-19 MED ORDER — METOCLOPRAMIDE HCL 5 MG PO TABS
5.0000 mg | ORAL_TABLET | Freq: Every day | ORAL | Status: DC
  Administered 2014-03-20 – 2014-03-21 (×2): 5 mg via ORAL
  Filled 2014-03-19 (×4): qty 1

## 2014-03-19 MED ORDER — INSULIN ASPART 100 UNIT/ML ~~LOC~~ SOLN
0.0000 [IU] | Freq: Every day | SUBCUTANEOUS | Status: DC
  Administered 2014-03-19: 5 [IU] via SUBCUTANEOUS

## 2014-03-19 NOTE — Progress Notes (Signed)
Inpatient Diabetes Program Recommendations  AACE/ADA: New Consensus Statement on Inpatient Glycemic Control (2013)  Target Ranges:  Prepandial:   less than 140 mg/dL      Peak postprandial:   less than 180 mg/dL (1-2 hours)      Critically ill patients:  140 - 180 mg/dL   Reason for Assessment:  Results for AENGUS, SAUCEDA (MRN [id number]) as of 03/19/2014 08:29  Ref. Range 03/18/2014 15:22 03/18/2014 17:42 03/18/2014 22:09 03/19/2014 00:35 03/19/2014 07:54  Glucose-Capillary Latest Range: 70-99 mg/dL 228 (H) 290 (H) 445 (H) 345 (H) 285 (H)   Diabetes history: Type 1 diabetes  Please consider increasing Lantus to home dose of 30 units daily.  Also may need to increase Novolog meal coverage to 4 units tid with meals.  Adah Perl, RN, BC-ADM Inpatient Diabetes Coordinator Pager [phone]

## 2014-03-19 NOTE — Progress Notes (Signed)
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 Was intubated via EMS. CXR was suspicious for bilateral PNA. He was transferred to [facility] for ICU care under PCCM direction. Extubated 6/10 after improvement in mental status.   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  02/05/2014 - Discharged from [facility] to rehab  5/29 - Discharged from rehab to home  6/9 - CT head - no acute intracranial process, chronic changes  6/9 - Transferred to [facility] ICU for altered mental status.  6/9 - Intubated  6/10 - Extubated, tolerated well  6/11 - Transfer to SDU  HPI/Subjective: Alert and no specific complaints; states encouraged that bladder empties without requiring foley since has glans excoriation-prefers condom cath  Assessment/Plan:    Acute respiratory failure with hypoxia:   A) Aspiration pneumonia   B) HCAP (healthcare-associated pneumonia) -stable on 4 L oxygen-wean as tolerated -pulmonary suspects possible component of weak diaphragm due to SCI -cont pulm toilet/CPT -cont anbx's    Insulin dependent diabetes mellitus -CBGs poorly controlled -increase Lantus back to home dose 30 units -increase SSI -Cont meal coverage    Gastroparesis due to DM -follow -resume Reglan    Spinal cord infarction -had significant spinal shock symptoms requiring BID Midodrine when dc'd from CIR -now hypertensive so will decrease to daily and monitor   History chronic pain -pre spinal cord infarction pt had significant neuropathic  pain and took Percocet -now on long acting narcs with muscle relaxers/Lyrica for spastic hemiparesis of legs  DVT prophylaxis: Lovenox Code Status: Full Family Communication: No family at bedside Disposition Plan/Expected LOS: Transfer to med bed   Consultants: PCCM  Procedures: None  Antibiotics: Aztreonam 6/9 >>>  Vancomycin 6/9 >>>  Objective: Blood pressure 163/99, pulse 79, temperature 97.6 F (36.4 C), temperature source Oral, resp. rate 18, height 5\' 8"  (1.727 m), weight 124 lb 12.5 oz (56.6 kg), SpO2 100.00%.  Intake/Output Summary (Last 24 hours) at 03/19/14 1231 Last data filed at 03/19/14 0945  Gross per 24 hour  Intake   1500 ml  Output    525 ml  Net    975 ml   Exam: General: No acute respiratory distress Lungs: Clear to auscultation bilaterally without wheezes or crackles, 4 L Cardiovascular: Regular rate and rhythm without murmur gallop or rub normal S1 and S2, no peripheral edema  Abdomen: Nontender, nondistended, soft, bowel sounds positive, no rebound, no ascites, no appreciable mass Musculoskeletal: No significant cyanosis, clubbing of bilateral lower extremities Neurological: Alert and oriented x 3, paraplegia with preserved upper extremity movement  Scheduled Meds:  Scheduled Meds: . aztreonam  1 g Intravenous Q8H  . chlorhexidine  15 mL Mouth Rinse BID  . enoxaparin (LOVENOX) injection  40 mg Subcutaneous Q24H  . insulin aspart  0-9 Units Subcutaneous TID WC  . insulin aspart  3 Units Subcutaneous TID WC  . insulin glargine  20 Units Subcutaneous QHS  . [START ON 03/20/2014] midodrine  5 mg Oral Daily  . morphine  15 mg Oral Q12H  . pantoprazole (PROTONIX) IV  40  mg Intravenous QHS  . pregabalin  75 mg Oral TID  . vancomycin  500 mg Intravenous Q8H   Data Reviewed: Basic Metabolic Panel:  Recent Labs Lab 03/17/14 0240 03/18/14 0210 03/19/14 0430  NA 139 132* 133*  K 4.1 4.1 4.4  CL 105 97 99  CO2 20 18* 21  GLUCOSE 99 404* 327*    BUN 19 16 12   CREATININE 0.71 0.71 0.55  CALCIUM 10.1 10.4 10.8*  MG 1.4*  --   --   PHOS 4.4  --   --    Liver Function Tests: No results found for this basename: AST, ALT, ALKPHOS, BILITOT, PROT, ALBUMIN,  in the last 168 hours  CBC:  Recent Labs Lab 03/17/14 0240 03/18/14 0210 03/19/14 0430  WBC 25.3* 23.9* 21.9*  HGB 8.1* 8.6* 8.9*  HCT 25.5* 27.3* 28.2*  MCV 79.7 79.1 79.2  PLT 482* 502* 529*   BNP (last 3 results)  Recent Labs  09/28/13 0430 10/02/13 0445 01/29/14 2113  PROBNP 673.0* 2451.0* 1343.0*   CBG:  Recent Labs Lab 03/18/14 1742 03/18/14 2209 03/19/14 0035 03/19/14 0754 03/19/14 1148  GLUCAP 290* 445* 345* 285* 343*    Recent Results (from the past 240 hour(s))  URINE CULTURE     Status: None   Collection Time    03/16/14  3:55 PM      Result Value Ref Range Status   Specimen Description URINE, CATHETERIZED   Final   Special Requests Normal   Final   Culture  Setup Time     Final   Value: 03/16/2014 19:49     Performed at [place]     Final   Value: NO GROWTH     Performed at [facility]   Culture     Final   Value: NO GROWTH     Performed at [facility]   Report Status 03/17/2014 FINAL   Final  MRSA PCR SCREENING     Status: None   Collection Time    03/16/14  3:56 PM      Result Value Ref Range Status   MRSA by PCR NEGATIVE  NEGATIVE Final   Comment:            The GeneXpert MRSA Assay (FDA     approved for NASAL specimens     only), is one component of a     comprehensive MRSA colonization     surveillance program. It is not     intended to diagnose MRSA     infection nor to guide or     monitor treatment for     MRSA infections.  CULTURE, RESPIRATORY (NON-EXPECTORATED)     Status: None   Collection Time    03/16/14  5:39 PM      Result Value Ref Range Status   Specimen Description TRACHEAL ASPIRATE   Final   Special Requests NONE   Final   Gram Stain     Final   Value:  ABUNDANT WBC PRESENT, PREDOMINANTLY PMN     RARE SQUAMOUS EPITHELIAL CELLS PRESENT     FEW GRAM POSITIVE COCCI     IN PAIRS RARE Y     Performed at [facility]   Culture     Final   Value: FEW CANDIDA ALBICANS     Performed at [facility]   Report Status 03/18/2014 FINAL   Final  CLOSTRIDIUM DIFFICILE BY PCR  Status: None   Collection Time    03/17/14 10:35 PM      Result Value Ref Range Status   C difficile by pcr NEGATIVE  NEGATIVE Final     Studies:  Recent x-ray studies have been reviewed in detail by the Attending Physician     Erin Hearing, ANP [facility] Office  [phone] Pager [phone]  **If unable to reach the above provider after paging please contact the [place] @ [phone]  On-Call/Text Page:      [email or web address]      password [email or web address]  If 7PM-7AM, please contact night-coverage [email or web address] Password [email or web address] 03/19/2014, 12:31 PM   LOS: 3 days   I have personally examined this patient and reviewed the entire database. I have reviewed the above note, made any necessary editorial changes, and agree with its content.  Cherene Altes, MD [facility]

## 2014-03-19 NOTE — Progress Notes (Signed)
Pt had transfer orders to go to [place] to room 15. Notified pt and pt's family. Pt was bathed and new condom cath placed on d/t old one coming off. Reported to 5 W nurse. Gave report to 5 w nurse. Pt resting on way to floor. Pt transferred on 4 liters of oxygen. Called and notified central telemetry. Notified respiratory.

## 2014-03-19 NOTE — Progress Notes (Signed)
Chest vest for 10 min. Pt tolerated well

## 2014-03-20 ENCOUNTER — Inpatient Hospital Stay (HOSPITAL_COMMUNITY): Payer: Medicaid Other

## 2014-03-20 LAB — CBC
HCT: 27.5 % — ABNORMAL LOW (ref 39.0–52.0)
Hemoglobin: 8.7 g/dL — ABNORMAL LOW (ref 13.0–17.0)
MCH: 24.9 pg — ABNORMAL LOW (ref 26.0–34.0)
MCHC: 31.6 g/dL (ref 30.0–36.0)
MCV: 78.6 fL (ref 78.0–100.0)
PLATELETS: 518 10*3/uL — AB (ref 150–400)
RBC: 3.5 MIL/uL — AB (ref 4.22–5.81)
RDW: 15.8 % — ABNORMAL HIGH (ref 11.5–15.5)
WBC: 15.5 10*3/uL — ABNORMAL HIGH (ref 4.0–10.5)

## 2014-03-20 LAB — BASIC METABOLIC PANEL
BUN: 13 mg/dL (ref 6–23)
CALCIUM: 10.8 mg/dL — AB (ref 8.4–10.5)
CO2: 22 meq/L (ref 19–32)
Chloride: 102 mEq/L (ref 96–112)
Creatinine, Ser: 0.74 mg/dL (ref 0.50–1.35)
GFR calc Af Amer: >90 mL/min (ref >90)
Glucose, Bld: 198 mg/dL — ABNORMAL HIGH (ref 70–99)
Potassium: 3.9 mEq/L (ref 3.7–5.3)
SODIUM: 138 meq/L (ref 137–147)

## 2014-03-20 LAB — GLUCOSE, CAPILLARY
Glucose-Capillary: 173 mg/dL — ABNORMAL HIGH (ref 70–99)
Glucose-Capillary: 179 mg/dL — ABNORMAL HIGH (ref 70–99)
Glucose-Capillary: 212 mg/dL — ABNORMAL HIGH (ref 70–99)
Glucose-Capillary: 150 mg/dL — ABNORMAL HIGH (ref 70–99)

## 2014-03-20 MED ORDER — OXYCODONE HCL 5 MG PO TABS
20.0000 mg | ORAL_TABLET | ORAL | Status: DC | PRN
  Administered 2014-03-20 – 2014-03-21 (×4): 20 mg via ORAL
  Filled 2014-03-20 (×4): qty 4

## 2014-03-20 MED ORDER — OXYCODONE HCL 5 MG PO TABS
10.0000 mg | ORAL_TABLET | Freq: Once | ORAL | Status: AC
  Administered 2014-03-20: 10 mg via ORAL
  Filled 2014-03-20: qty 2

## 2014-03-20 NOTE — Progress Notes (Signed)
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 Was intubated via EMS. CXR was suspicious for bilateral PNA. He was transferred to [facility] for ICU care under PCCM direction. Extubated 6/10 after improvement in mental status.     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  02/05/2014 - Discharged from [facility] to rehab  5/29 - Discharged from rehab to home  6/9 - CT head - no acute intracranial process, chronic changes  6/9 - Transferred to [facility] ICU for altered mental status.  6/9 - Intubated  6/10 - Extubated, tolerated well  6/11 - Transfer to SDU    HPI/Subjective:  Alert and no specific complaints; states encouraged that bladder empties without requiring foley since has glans excoriation-prefers condom cath.    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 -cont pulm toilet/CPT -cont abx's, improving     Insulin dependent diabetes mellitus -CBGs poorly controlled -increase Lantus back to home dose 30 units -increase SSI -Cont meal coverage  Lab Results  Component Value Date   HGBA1C 8.5* 03/19/2014    CBG (last 3)   Recent Labs  03/19/14 1637 03/19/14 2141 03/20/14 0828  GLUCAP 210* 449* 173*       Gastroparesis due to DM -follow -resume Reglan      Spinal cord infarction -had significant spinal shock symptoms  requiring BID Midodrine when dc'd from CIR -now hypertensive so will decrease to daily and monitor -supportive care      History chronic pain -pre spinal cord infarction pt had significant neuropathic pain and took Percocet -now on long acting narcs with muscle relaxers/Lyrica for spastic hemiparesis of legs     DVT prophylaxis: Lovenox Code Status: Full Family Communication: No family at bedside Disposition Plan/Expected LOS: Transfer to med bed      Consultants: PCCM   Procedures: None   Antibiotics:  Aztreonam 6/9 >>>  Vancomycin 6/9 >>>    Objective: Blood pressure 132/85, pulse 86, temperature 98.7 F (37.1 C), temperature source Oral, resp. rate 20, height 5\' 7"  (1.702 m), weight 62.914 kg (138 lb 11.2 oz), SpO2 98.00%.  Intake/Output Summary (Last 24 hours) at 03/20/14 0946 Last data filed at 03/20/14 0935  Gross per 24 hour  Intake 921.33 ml  Output   2400 ml  Net -1478.67 ml   Exam: General: No acute respiratory distress Lungs: few by basilar crackles, 4 L Cardiovascular: Regular rate and rhythm without murmur gallop or rub normal S1 and S2, no peripheral edema  Abdomen: Nontender, nondistended, soft, bowel sounds positive, no rebound, no ascites, no appreciable mass Musculoskeletal: No significant cyanosis, clubbing of bilateral lower extremities Neurological: Alert and oriented x 3, paraplegia with preserved upper extremity movement  Scheduled Meds:  Scheduled Meds: . antiseptic oral rinse  15 mL Mouth Rinse q12n4p  . aztreonam  1 g Intravenous Q8H  . chlorhexidine  15 mL Mouth Rinse BID  .  enoxaparin (LOVENOX) injection  40 mg Subcutaneous Q24H  . insulin aspart  0-15 Units Subcutaneous TID WC  . insulin aspart  0-5 Units Subcutaneous QHS  . insulin aspart  3 Units Subcutaneous TID WC  . insulin glargine  30 Units Subcutaneous QHS  . metoCLOPramide  5 mg Oral Q breakfast  . midodrine  5 mg Oral Daily  . morphine  15 mg Oral Q12H  .  pantoprazole (PROTONIX) IV  40 mg Intravenous QHS  . pregabalin  75 mg Oral TID  . vancomycin  500 mg Intravenous Q8H   Anti-infectives   Start     Dose/Rate Route Frequency Ordered Stop   03/17/14 0300  vancomycin (VANCOCIN) 500 mg in sodium chloride 0.9 % 100 mL IVPB     500 mg 100 mL/hr over 60 Minutes Intravenous Every 8 hours 03/16/14 1708     03/16/14 1800  aztreonam (AZACTAM) 1 g in dextrose 5 % 50 mL IVPB     1 g 100 mL/hr over 30 Minutes Intravenous Every 8 hours 03/16/14 1708     03/16/14 1715  vancomycin (VANCOCIN) IVPB 1000 mg/200 mL premix     1,000 mg 200 mL/hr over 60 Minutes Intravenous STAT 03/16/14 1708 03/16/14 1919       Data Reviewed: Basic Metabolic Panel:  Recent Labs Lab 03/17/14 0240 03/18/14 0210 03/19/14 0430 03/20/14 0508  NA 139 132* 133* 138  K 4.1 4.1 4.4 3.9  CL 105 97 99 102  CO2 20 18* 21 22  GLUCOSE 99 404* 327* 198*  BUN 19 16 12 13   CREATININE 0.71 0.71 0.55 0.74  CALCIUM 10.1 10.4 10.8* 10.8*  MG 1.4*  --   --   --   PHOS 4.4  --   --   --    Liver Function Tests: No results found for this basename: AST, ALT, ALKPHOS, BILITOT, PROT, ALBUMIN,  in the last 168 hours  CBC:  Recent Labs Lab 03/17/14 0240 03/18/14 0210 03/19/14 0430 03/20/14 0508  WBC 25.3* 23.9* 21.9* 15.5*  HGB 8.1* 8.6* 8.9* 8.7*  HCT 25.5* 27.3* 28.2* 27.5*  MCV 79.7 79.1 79.2 78.6  PLT 482* 502* 529* 518*   BNP (last 3 results)  Recent Labs  09/28/13 0430 10/02/13 0445 01/29/14 2113  PROBNP 673.0* 2451.0* 1343.0*   CBG:  Recent Labs Lab 03/19/14 0754 03/19/14 1148 03/19/14 1637 03/19/14 2141 03/20/14 0828  GLUCAP 285* 343* 210* 449* 173*    Recent Results (from the past 240 hour(s))  URINE CULTURE     Status: None   Collection Time    03/16/14  3:55 PM      Result Value Ref Range Status   Specimen Description URINE, CATHETERIZED   Final   Special Requests Normal   Final   Culture  Setup Time     Final   Value: 03/16/2014 19:49       Performed at [place]     Final   Value: NO GROWTH     Performed at [facility]   Culture     Final   Value: NO GROWTH     Performed at [facility]   Report Status 03/17/2014 FINAL   Final  MRSA PCR SCREENING     Status: None   Collection Time    03/16/14  3:56 PM      Result Value Ref Range Status   MRSA by PCR NEGATIVE  NEGATIVE Final  Comment:            The GeneXpert MRSA Assay (FDA     approved for NASAL specimens     only), is one component of a     comprehensive MRSA colonization     surveillance program. It is not     intended to diagnose MRSA     infection nor to guide or     monitor treatment for     MRSA infections.  CULTURE, RESPIRATORY (NON-EXPECTORATED)     Status: None   Collection Time    03/16/14  5:39 PM      Result Value Ref Range Status   Specimen Description TRACHEAL ASPIRATE   Final   Special Requests NONE   Final   Gram Stain     Final   Value: ABUNDANT WBC PRESENT, PREDOMINANTLY PMN     RARE SQUAMOUS EPITHELIAL CELLS PRESENT     FEW GRAM POSITIVE COCCI     IN PAIRS RARE Y     Performed at [facility]   Culture     Final   Value: FEW CANDIDA ALBICANS     Performed at [facility]   Report Status 03/18/2014 FINAL   Final  CLOSTRIDIUM DIFFICILE BY PCR     Status: None   Collection Time    03/17/14 10:35 PM      Result Value Ref Range Status   C difficile by pcr NEGATIVE  NEGATIVE Final     Thurnell Lose M.D on 03/20/2014 at 9:46 AM  Between 7am to 7pm - Pager - [phone], After 7pm go to [email or web address] - password [email or web address]  And look for the night coverage person covering me after hours  Ko Olina  [phone]   LOS: 4 days   I have personally examined this patient and reviewed the entire database. I have reviewed the above note, made any necessary editorial changes, and agree with its content.  Cherene Altes, MD [facility]

## 2014-03-20 NOTE — Progress Notes (Signed)
Encouraged pt to use flutter valve & incentive spirometer. Pt using flutter valve now.

## 2014-03-20 NOTE — Progress Notes (Addendum)
Pt requesting something else for pain. Notified provider on call for further orders. Will continue to monitor pt.  Pt was given 750mg  of robaxin po for pain rated 9/10. After checking on pt's pain post getting the medicine, pt states pain is unchanged. Paged provider on call again. Pt was given a one time dose of oxy  IR 10mg . Will give pt medicine and continue to monitor.

## 2014-03-21 DIAGNOSIS — G9519 Other vascular myelopathies: Secondary | ICD-10-CM

## 2014-03-21 LAB — GLUCOSE, CAPILLARY
GLUCOSE-CAPILLARY: 152 mg/dL — AB (ref 70–99)
Glucose-Capillary: 84 mg/dL (ref 70–99)

## 2014-03-21 LAB — CBC
HCT: 27.1 % — ABNORMAL LOW (ref 39.0–52.0)
HEMOGLOBIN: 8.5 g/dL — AB (ref 13.0–17.0)
MCH: 24.9 pg — AB (ref 26.0–34.0)
MCHC: 31.4 g/dL (ref 30.0–36.0)
MCV: 79.5 fL (ref 78.0–100.0)
Platelets: 483 10*3/uL — ABNORMAL HIGH (ref 150–400)
RBC: 3.41 MIL/uL — ABNORMAL LOW (ref 4.22–5.81)
RDW: 16.4 % — ABNORMAL HIGH (ref 11.5–15.5)
WBC: 15.5 10*3/uL — ABNORMAL HIGH (ref 4.0–10.5)

## 2014-03-21 MED ORDER — LEVOFLOXACIN 750 MG PO TABS: 750.0000 mg | ORAL_TABLET | Freq: Every day | ORAL | Status: DC

## 2014-03-21 MED ORDER — INSULIN GLARGINE 100 UNIT/ML ~~LOC~~ SOLN: 30.0000 [IU] | Freq: Every day | SUBCUTANEOUS | Status: DC

## 2014-03-21 MED ORDER — SACCHAROMYCES BOULARDII 250 MG PO CAPS: 250.0000 mg | ORAL_CAPSULE | Freq: Two times a day (BID) | ORAL | Status: DC

## 2014-03-21 MED ORDER — INSULIN ASPART 100 UNIT/ML ~~LOC~~ SOLN: 1.0000 [IU] | Freq: Four times a day (QID) | SUBCUTANEOUS | Status: DC

## 2014-03-21 MED ORDER — METOCLOPRAMIDE HCL 5 MG PO TABS: 5.0000 mg | ORAL_TABLET | Freq: Every day | ORAL | Status: DC

## 2014-03-21 MED ORDER — MIDODRINE HCL 5 MG PO TABS: 5.0000 mg | ORAL_TABLET | Freq: Two times a day (BID) | ORAL | Status: DC

## 2014-03-21 MED ORDER — MORPHINE SULFATE 4 MG/ML IJ SOLN
4.0000 mg | Freq: Once | INTRAMUSCULAR | Status: AC
  Administered 2014-03-21: 4 mg via INTRAVENOUS
  Filled 2014-03-21: qty 1

## 2014-03-21 NOTE — Care Management Note (Signed)
    Page 1 of 2   03/21/2014     11:53:57 AM CARE MANAGEMENT NOTE 03/21/2014  Patient:  Jason [name], Jason [name]   Account Number:  [id number]  Date Initiated:  03/17/2014  Documentation initiated by:  Luz Lex  Subjective/Objective Assessment:   Found unresponsive - Intubated     Action/Plan:   Anticipated DC Date:  03/24/2014   Anticipated DC Plan:  [place]  CM consult  [place] Program  Medication Assistance      [facility] Choice  HOME HEALTH  DURABLE MEDICAL EQUIPMENT   Choice offered to / List presented to:  C-1 Patient   DME arranged  SUCTION      DME agency  [facility] arranged  HH-10 DISEASE MANAGEMENT  HH-1 RN      [place].   Status of service:  Completed, signed off Medicare Important Message given?  NO (If response is "NO", the following Medicare IM given date fields will be blank) Date Medicare IM given:   Date Additional Medicare IM given:    Discharge Disposition:  [place]  Per UR Regulation:  Reviewed for med. necessity/level of care/duration of stay  If discussed at Long Length of Stay Meetings, dates discussed:    Comments:  Contact:  Steed,[name] Significant other     [place] Mother     [phone]                 [place], [place] Father [phone]  03-21-14  [address], [place] [phone] CM spoke to pt and mother in reference to home needs. Per mother pt is active with [facility] for [facility] services. [facility] is aware that pt is ready for d/c. DME ordered portable suction and [facility] is aware and will deliver to home. Per mother pt has medicaid, however a glitch in 's system and Medicaid not active as of yet. [place] Program utilized for medications lantus, novolog, midodrine, and antibiotic. CM did provide pt and mother patient assistance forms for lantus via [employer] for year supply free and novolog pt  assistance form. PCP to fill out patient assist forms if Medicaid does not come through. CM did call CSW Crystal to make her aware that pt will be ready for d/c today and will need ambulance transport home. Pt's mother states pt has Transportation to MD appointments  with [place] [facility]. Pt uses [facility] representative on [employer]. [address]. CM did relay to Mother that she needs to contact [facility] for medicaid as soon as possible in order for pt to be compliant with medications vs fill out forms for meds. MATCH can only be utilized once a years and no controlled subtstances covered under $3.00. No further needs from CM at this time.

## 2014-03-21 NOTE — Clinical Social Work Note (Signed)
CSW made aware by RN, patient needs ambulance assistance home as family cannot transport him via car and he is not able to ambulate and therefore cannot take the bus. CSW met with patient and family and confirmed address. CSW prepared packet and placed in patient's shadow chart. CSW to arrange transportation via [place]. No further needs. CSW signing off.   [place], [place] Weekend Clinical Social Worker [phone]

## 2014-03-21 NOTE — Discharge Instructions (Signed)
Follow with Primary MD  [name], MD  and other consultant as instructed your Hospitalist MD  Please get a complete blood count and chemistry panel checked by your Primary MD at your next visit, and again as instructed by your Primary MD.  Jason [name] had Pneumonia or Lung problems at the Hospital: Please get a 2 view Chest X ray done in 6-8 weeks after hospital discharge or sooner if instructed your Hospilatist MD at the hospital.   Get Medicines reviewed and adjusted. Please take all your medications with you for your next visit with your Primary MD  Please request your Primary MD to go over all hospital tests and procedure/radiological results at the follow up, please ask your Primary MD to get all Hospital records sent to his/her office.  If you experience worsening of your admission symptoms, develop shortness of breath, life threatening emergency, suicidal or homicidal thoughts you must seek medical attention immediately by calling 911 or calling your MD immediately  if symptoms less severe.  You must read complete instructions/literature along with all the possible adverse reactions/side effects for all the Medicines you take and that have been prescribed to you. Take any new Medicines after you have completely understood and accpet all the possible adverse reactions/side effects.   Do not drive when taking Pain medications.   Do not take more than prescribed Pain, Sleep and Anxiety Medications  Special Instructions: If you have smoked or chewed Tobacco  in the last 2 yrs please stop smoking, stop any regular Alcohol  and or any Recreational drug use.  Wear Seat belts while driving.  Please note  You were cared for by a hospitalist during your hospital stay. Once you are discharged, your primary care physician will handle any further medical issues. Please note that NO REFILLS for any discharge medications will be authorized once you are discharged, as it is imperative that you return  to your primary care physician (or establish a relationship with a primary care physician if you do not have one) for your aftercare needs so that they can reassess your need for medications and monitor your lab values.

## 2014-03-21 NOTE — Progress Notes (Signed)
Seen and examined, feels a lot better and wants to be discharged today. Mother at bedside. He does not want to remain hospitalized, and has requested discharge multiple times. He will be discharged home at his own request. Patient off O2, will be in close supervision of his mother. Family agreeable for discharge.

## 2014-03-21 NOTE — Progress Notes (Signed)
Scheduled CPT not done with vest-pt. being dc'd.

## 2014-03-21 NOTE — Discharge Summary (Signed)
PATIENT DETAILS Name: Jason [name] Age: 28 y.o. Sex: male Date of Birth: 1985/12/18 MRN: [id number]. Admit Date: 03/16/2014 Admitting Physician: Rigoberto Noel, MD PCP:FRY,STEPHEN A, MD  Recommendations for Outpatient Follow-up:  1. Please repeat Chest Xray in 6-8 weeks to document resolution of PNA 2. Please repeat CBC and BMET at next visit.  PRIMARY DISCHARGE DIAGNOSIS:  Active Problems:   Type 1 diabetes, uncontrolled, with neuropathy   Gastroparesis due to DM   Aspiration pneumonia   IDDM (insulin dependent diabetes mellitus)   Spinal cord infarction (history of)   Acute respiratory failure with hypoxia   HCAP (healthcare-associated pneumonia)      PAST MEDICAL HISTORY: Past Medical History  Diagnosis Date  . Diabetes mellitus   . GERD (gastroesophageal reflux disease)   . Asthma   . Hx MRSA infection     on face  . Gastroparesis   . Diabetic neuropathy   . Seizures     DISCHARGE MEDICATIONS:   Medication List    STOP taking these medications       ciprofloxacin 250 MG tablet  Commonly known as:  CIPRO     Insulin Infusion Pump Devi      TAKE these medications       DEXILANT 60 MG capsule  Generic drug:  dexlansoprazole  Take 60 mg by mouth at bedtime.     diclofenac sodium 1 % Gel  Commonly known as:  VOLTAREN  Apply 2 g topically daily as needed (for pain).     insulin aspart 100 UNIT/ML injection  Commonly known as:  novoLOG  Inject 1-17 Units into the skin 4 (four) times daily. Per sliding scale     insulin glargine 100 UNIT/ML injection  Commonly known as:  LANTUS  Inject 30 Units into the skin at bedtime.     levofloxacin 750 MG tablet  Commonly known as:  LEVAQUIN  Take 1 tablet (750 mg total) by mouth daily.     lidocaine 2 % jelly  Commonly known as:  XYLOCAINE  Apply topically 4 (four) times daily as needed. To abrasion prn pain.     methocarbamol 750 MG tablet  Commonly known as:  ROBAXIN  Take 1 tablet (750 mg total)  by mouth 4 (four) times daily as needed for muscle spasms.     metoCLOPramide 5 MG tablet  Commonly known as:  REGLAN  Take 1 tablet (5 mg total) by mouth daily with breakfast.     midodrine 5 MG tablet  Commonly known as:  PROAMATINE  Take 1 tablet (5 mg total) by mouth 2 (two) times daily with a meal. To help maintain blood pressure     Morphine Sulfate 40 MG Cp24  Take 40 mg by mouth every 12 (twelve) hours.     ondansetron 8 MG tablet  Commonly known as:  ZOFRAN  Take 1 tablet (8 mg total) by mouth every 12 (twelve) hours as needed for nausea.     OVER THE COUNTER MEDICATION  Take 1 tablet by mouth daily. Allergy medication     oxyCODONE-acetaminophen 5-325 MG per tablet  Commonly known as:  PERCOCET/ROXICET  Take 1-2 tablets by mouth every 4 (four) hours as needed for severe pain.     pregabalin 100 MG capsule  Commonly known as:  LYRICA  Take 100 mg by mouth 3 (three) times daily.     silver sulfADIAZINE 1 % cream  Commonly known as:  SILVADENE  Apply 1 application topically 2 (two)  times a week. Tuesday and thursday     ZEGERID OTC 20-1100 MG Caps capsule  Generic drug:  Omeprazole-Sodium Bicarbonate  Take 1 capsule by mouth at bedtime as needed (for heartburn when out of Dexilant).        ALLERGIES:   Allergies  Allergen Reactions  . Cefuroxime Axetil Anaphylaxis  . Penicillins Anaphylaxis    ?can take amoxicillin?  [name] [Benzonatate] Anaphylaxis  . Shellfish Allergy Itching    BRIEF HPI:  See H&P, Labs, Consult and Test reports for all details in brief, 28 year old male with history of spinal cord infarction 01/30/2014 with residual paraplegia, IDDM, gastroparesis, and several recent infectious processes presented to [facility] 6/9 after being found at home unresponsive. Was intubated via EMS. CXR was suspicious for bilateral PNA. He was transferred to [facility] for ICU care   CONSULTATIONS:   pulmonary/intensive care  PERTINENT RADIOLOGIC STUDIES: Dg Chest  2 View  02/20/2014   CLINICAL DATA:  History of diabetes.  Low grade fever.  EXAM: CHEST  2 VIEW  COMPARISON:  DG CHEST 1V PORT dated 02/17/2014  FINDINGS: Stable cardiac mediastinal contours. No consolidative pulmonary opacities. No pleural effusion or pneumothorax. Unchanged chronic fracture of the distal aspect of the left clavicle.  IMPRESSION: No acute cardiopulmonary process.   Electronically Signed   By: Lovey Newcomer M.D.   On: 02/20/2014 14:25   Dg Chest Port 1 View  03/20/2014   CLINICAL DATA:  Infiltrate  EXAM: PORTABLE CHEST - 1 VIEW  COMPARISON:  03/19/2014  FINDINGS: Cardiomediastinal silhouette is stable. Persistent streaky infiltrates right perihilar, infrahilar and left base. Findings suspicious for pneumonia. Follow-up to resolution recommended.  IMPRESSION: Persistent streaky infiltrates right perihilar, infrahilar and left base. Findings suspicious for pneumonia. Follow-up to resolution recommended.   Electronically Signed   By: Lahoma Crocker M.D.   On: 03/20/2014 09:07   Dg Chest Port 1 View  03/19/2014   CLINICAL DATA:  History of shortness of breath.  EXAM: PORTABLE CHEST - 1 VIEW  COMPARISON:  03/18/2014 and 03/17/2014  FINDINGS: There are persistent parenchymal densities at both lung bases. There are also densities in the right hilar region. Findings are concerning for airspace disease and infection. Heart size is normal. No evidence for pneumothorax.  IMPRESSION: There has been minimal change in the bibasilar parenchymal lung densities. Findings are concerning for infection.   Electronically Signed   By: Markus Daft M.D.   On: 03/19/2014 08:20   Dg Chest Port 1 View  03/18/2014   CLINICAL DATA:  Shortness of breath.  EXAM: PORTABLE CHEST - 1 VIEW  COMPARISON:  03/17/2014.  FINDINGS: Bibasilar pulmonary infiltrates again noted consistent with pneumonia. Heart size normal. No pleural effusion or pneumothorax. No acute bony abnormality. Deformity of the distal clavicles noted bilaterally.  These changes are chronic and stable. These changes may be related to prior trauma.  IMPRESSION: 1.  Interim removal of NG tube.  2.  Bibasilar pulmonary infiltrates.  No interim change.   Electronically Signed   By: Marcello Moores  Register   On: 03/18/2014 07:46   Dg Chest Port 1 View  03/17/2014   CLINICAL DATA:  Intubation.  EXAM: PORTABLE CHEST - 1 VIEW  COMPARISON:  Chest x-ray 03/16/2014 and 02/15/2010.  FINDINGS: Interim placement of NG tube, tube in good anatomic position. Endotracheal tube in good anatomic position. Bibasilar pulmonary infiltrates are again noted. These have improved slightly from prior exam. Heart size and pulmonary vascularity are stable. No pulmonary venous  congestion. No pleural effusion or pneumothorax. Deformity of the distal clavicles again noted. These changes are stable from multiple prior exams. These changes may be related to prior trauma.  IMPRESSION: 1. Interim placement of NG tube, its tip is below the left hemidiaphragm. Endotracheal tube in stable position. 2. Persistent bilateral pulmonary alveolar infiltrates with slight improvement.   Electronically Signed   By: [place]   On: 03/17/2014 07:15   Dg Abd Portable 1v  03/16/2014   CLINICAL DATA:  Orogastric tube placement.  EXAM: PORTABLE ABDOMEN - 1 VIEW  COMPARISON:  02/05/2014  FINDINGS: Supine view of the abdomen and pelvis. Orogastric tube terminates at the gastric body. There is a rectal thermister. Non-obstructive bowel gas pattern. Probable vascular calcifications in the left hemipelvis.  IMPRESSION: Orogastric tube terminating at the body of the stomach.   Electronically Signed   By: Abigail Miyamoto M.D.   On: 03/16/2014 19:15     PERTINENT LAB RESULTS: CBC:  Recent Labs  03/20/14 0508 03/21/14 0555  WBC 15.5* 15.5*  HGB 8.7* 8.5*  HCT 27.5* 27.1*  PLT 518* 483*   CMET CMP     Component Value Date/Time   NA 138 03/20/2014 0508   K 3.9 03/20/2014 0508   CL 102 03/20/2014 0508   CO2 22 03/20/2014  0508   GLUCOSE 198* 03/20/2014 0508   BUN 13 03/20/2014 0508   CREATININE 0.74 03/20/2014 0508   CALCIUM 10.8* 03/20/2014 0508   PROT 6.3 02/05/2014 0404   ALBUMIN 1.5* 02/05/2014 0404   AST 15 02/05/2014 0404   ALT 5 02/05/2014 0404   ALKPHOS 115 02/05/2014 0404   BILITOT 0.2* 02/05/2014 0404   GFRNONAA >90 03/20/2014 0508   GFRAA >90 03/20/2014 0508    GFR Estimated Creatinine Clearance: 122.9 ml/min (by C-G formula based on Cr of 0.74). No results found for this basename: LIPASE, AMYLASE,  in the last 72 hours No results found for this basename: CKTOTAL, CKMB, CKMBINDEX, TROPONINI,  in the last 72 hours No components found with this basename: POCBNP,  No results found for this basename: DDIMER,  in the last 72 hours  Recent Labs  03/19/14 1350  HGBA1C 8.5*   No results found for this basename: CHOL, HDL, LDLCALC, TRIG, CHOLHDL, LDLDIRECT,  in the last 72 hours No results found for this basename: TSH, T4TOTAL, FREET3, T3FREE, THYROIDAB,  in the last 72 hours No results found for this basename: VITAMINB12, FOLATE, FERRITIN, TIBC, IRON, RETICCTPCT,  in the last 72 hours Coags: No results found for this basename: PT, INR,  in the last 72 hours Microbiology: Recent Results (from the past 240 hour(s))  URINE CULTURE     Status: None   Collection Time    03/16/14  3:55 PM      Result Value Ref Range Status   Specimen Description URINE, CATHETERIZED   Final   Special Requests Normal   Final   Culture  Setup Time     Final   Value: 03/16/2014 19:49     Performed at [facility]     Final   Value: NO GROWTH     Performed at [facility]   Culture     Final   Value: NO GROWTH     Performed at [facility]   Report Status 03/17/2014 FINAL   Final  MRSA PCR SCREENING     Status: None   Collection Time    03/16/14  3:56  PM      Result Value Ref Range Status   MRSA by PCR NEGATIVE  NEGATIVE Final   Comment:            The GeneXpert MRSA Assay (FDA      approved for NASAL specimens     only), is one component of a     comprehensive MRSA colonization     surveillance program. It is not     intended to diagnose MRSA     infection nor to guide or     monitor treatment for     MRSA infections.  CULTURE, RESPIRATORY (NON-EXPECTORATED)     Status: None   Collection Time    03/16/14  5:39 PM      Result Value Ref Range Status   Specimen Description TRACHEAL ASPIRATE   Final   Special Requests NONE   Final   Gram Stain     Final   Value: ABUNDANT WBC PRESENT, PREDOMINANTLY PMN     RARE SQUAMOUS EPITHELIAL CELLS PRESENT     FEW GRAM POSITIVE COCCI     IN PAIRS RARE Y     Performed at [facility]   Culture     Final   Value: FEW CANDIDA ALBICANS     Performed at [facility]   Report Status 03/18/2014 FINAL   Final  CLOSTRIDIUM DIFFICILE BY PCR     Status: None   Collection Time    03/17/14 10:35 PM      Result Value Ref Range Status   C difficile by pcr NEGATIVE  NEGATIVE Final     BRIEF HOSPITAL COURSE:  Brief narrative:  28 year old male with history of spinal cord infarction 01/30/2014 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 Was intubated via EMS. CXR was suspicious for bilateral PNA. He was transferred to [facility] for ICU care under PCCM direction. Extubated 6/10 after improvement in mental status and transferred to [facility].  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  02/05/2014 - Discharged from [facility] to rehab  5/29 - Discharged from rehab to home  6/9 - CT head - no acute intracranial process, chronic changes  6/9 - Transferred to [facility] ICU for altered mental status.  6/9 - Intubated  6/10 - Extubated, tolerated well  6/11 - Transfer to [facility] Course by Problem  List  Acute respiratory failure with hypoxia: secondary to  A) Aspiration pneumonia B) HCAP (healthcare-associated pneumonia)  -Intubated on 6/9, admitted to ICU, Started on IV Vanco, Aztreonam. Extubated on 6/10, transferred to SDU on 6/11. -after extubation stable on 4 L oxygen-weaned as tolerated -now on room air. -pulmonary suspects possible component of weak diaphragm due to SCI  -insisting on discharge, as he feels so much better, this MD requested patient to stay one more day, however refused and wants to be discharged today. Mother at bedside, who will provide supervision. Patient and mother know that if any worsening, they need to seek immediate medical attention. -Will transition to Levaquin on discharge for 5 more days, he will require a repeat chest xray in 6-8 weeks.  DM -apparently has used Insulin pump in the past, I have instructed him to continue with Lantus and SSI , and only go back to using the Insulin pump after  talking with his PCP   Gastroparesis due to DM  -follow  -resume Reglan   Spinal cord infarction  -had significant spinal shock symptoms requiring BID Midodrine when dc'd from CIR  -now hypertensive so will decrease to daily and monitor  -supportive care   History chronic pain  -pre spinal cord infarction pt had significant neuropathic pain and took Percocet  -now on long acting narcs with muscle relaxers/Lyrica for spastic hemiparesis of legs-continue on discharge.  TODAY-DAY OF DISCHARGE:  Subjective:   [facility] today has no headache,no chest abdominal pain,no new weakness tingling or numbness, feels much better wants to go home today.  Objective:   Blood pressure 126/87, pulse 83, temperature 98.5 F (36.9 C), temperature source Oral, resp. rate 18, height 5\' 7"  (1.702 m), weight 63.776 kg (140 lb 9.6 oz), SpO2 98.00%.  Intake/Output Summary (Last 24 hours) at 03/21/14 1030 Last data filed at 03/21/14 0942  Gross per 24 hour  Intake   593.5 ml  Output   1580 ml  Net -986.5 ml   Filed Weights   03/19/14 1303 03/20/14 1241 03/21/14 0534  Weight: 62.914 kg (138 lb 11.2 oz) 63.776 kg (140 lb 9.6 oz) 63.776 kg (140 lb 9.6 oz)    Exam Awake Alert, Oriented *3, No new F.N deficits, Normal affect [place].AT,PERRAL Supple Neck,No JVD, No cervical lymphadenopathy appriciated.  Symmetrical Chest wall movement, Good air movement bilaterally, CTAB RRR,No Gallops,Rubs or new Murmurs, No Parasternal Heave +ve B.Sounds, Abd Soft, Non tender, No organomegaly appriciated, No rebound -guarding or rigidity. No Cyanosis, Clubbing or edema, No new Rash or bruise  DISCHARGE CONDITION: Stable  DISPOSITION: Home with home health services  DISCHARGE INSTRUCTIONS:    Activity:  As tolerated with Full fall precautions use walker/cane & assistance as needed  Diet recommendation: Regular Diet      Discharge Instructions   Call MD for:  difficulty breathing, headache or visual disturbances    Complete by:  As directed      Diet general    Complete by:  As directed      Discharge instructions    Complete by:  As directed   Please continue to use incentive spirometry and flutter valve at home     Increase activity slowly    Complete by:  As directed            Follow-up Information   Follow up with FRY,STEPHEN A, MD. Schedule an appointment as soon as possible for a visit in 1 week.   Specialty:  Family Medicine   Contact information:   [place] [address] [phone]       Total Time spent on discharge equals 45 minutes.  SignedOren Binet 03/21/2014 10:30 AM  **Disclaimer: This note may have been dictated with voice recognition software. Similar sounding words can inadvertently be transcribed and this note may contain transcription errors which may not have been corrected upon publication of note.**

## 2014-03-21 NOTE — Progress Notes (Addendum)
Patient discharge teaching given, including activity, diet, follow-up appoints, and medications. Patient verbalized understanding of all discharge instructions. IV access was d/c'd. Vitals are stable. Skin is intact except as charted in most recent assessments. Pt to be driven home by ambulance service.

## 2014-03-22 ENCOUNTER — Encounter: Payer: Self-pay | Admitting: Internal Medicine

## 2014-03-22 ENCOUNTER — Ambulatory Visit (INDEPENDENT_AMBULATORY_CARE_PROVIDER_SITE_OTHER): Payer: Medicaid Other | Admitting: Internal Medicine

## 2014-03-22 VITALS — BP 121/81 | HR 84 | Temp 98.6°F | Wt 140.0 lb

## 2014-03-22 DIAGNOSIS — E11621 Type 2 diabetes mellitus with foot ulcer: Secondary | ICD-10-CM

## 2014-03-22 DIAGNOSIS — L97509 Non-pressure chronic ulcer of other part of unspecified foot with unspecified severity: Secondary | ICD-10-CM

## 2014-03-22 DIAGNOSIS — E1169 Type 2 diabetes mellitus with other specified complication: Secondary | ICD-10-CM

## 2014-03-22 NOTE — Progress Notes (Signed)
   Subjective:    Patient ID: Jason [name], male    DOB: 1986-01-27, 28 y.o.   MRN: [id number]  HPI Here for follow up with concern for diabetic foot ulcer vs osteomyelitis. He was seen by Dr. Tommy Medal during hospitalization for acute onset bilateral lower extremity paralysis found to be due to T8 spinal infarct.  The patient has had bilateral foot ulcerations and previously has been seeing Dr. Judene Companion of wound care.  During the hospitalization he was seen in regards to his ulcers and xrays did not suggest osteomyelitis.   He was initially seen due to concern for transverse myelitis and infectious causes and had a negative work up including negative lyme, RPR, HIV RNA, autoimmune.  MRI suggested more infarct and neurology feels that it is.  His wound is without discharge, no smell, no surrounding erythema.  One loose stool daily.    Review of Systems  Constitutional: Negative for fatigue.  Gastrointestinal: Negative for nausea and diarrhea.  Neurological: Negative for dizziness and light-headedness.       Objective:   Physical Exam  Constitutional:  In wheelchair  Musculoskeletal:  Thin extremities  Skin:  Multiple wounds on legs, covered with wound care dressings          Assessment & Plan:

## 2014-03-22 NOTE — Assessment & Plan Note (Signed)
Though his ESR was significantly elevated, this was in the setting of subacute infarct.  Unclear of what significance that has for osteomyelitis.  At this time, his wounds are doing well, no concrening signs or history, so will have him stay off antibiotics at this time (after he finishes his treatment for HCAP) and continue with wound care. RTC PRN.

## 2014-03-23 ENCOUNTER — Ambulatory Visit: Payer: Self-pay | Admitting: Family Medicine

## 2014-03-26 DIAGNOSIS — N179 Acute kidney failure, unspecified: Secondary | ICD-10-CM

## 2014-03-26 DIAGNOSIS — R29898 Other symptoms and signs involving the musculoskeletal system: Secondary | ICD-10-CM

## 2014-03-26 DIAGNOSIS — R609 Edema, unspecified: Secondary | ICD-10-CM

## 2014-03-26 DIAGNOSIS — J690 Pneumonitis due to inhalation of food and vomit: Secondary | ICD-10-CM

## 2014-03-30 ENCOUNTER — Telehealth: Payer: Self-pay | Admitting: Family Medicine

## 2014-03-30 NOTE — Telephone Encounter (Signed)
Pt has a foley that was being changed EOW. Do you want [facility] to continue to change EOW or switch to monthly which is standard. Tanya at [facility]  Verbal is ok

## 2014-03-30 NOTE — Telephone Encounter (Signed)
Ok to change

## 2014-03-30 NOTE — Telephone Encounter (Signed)
I left a voice message for [name] with below information, gave verbal order.

## 2014-03-30 NOTE — Telephone Encounter (Signed)
Is this okay?

## 2014-04-02 ENCOUNTER — Encounter: Payer: Medicaid Other | Admitting: Physical Medicine & Rehabilitation

## 2014-04-02 ENCOUNTER — Emergency Department (HOSPITAL_BASED_OUTPATIENT_CLINIC_OR_DEPARTMENT_OTHER)
Admission: EM | Admit: 2014-04-02 | Discharge: 2014-04-03 | Disposition: A | Discharge status: Home / Self Care | Payer: Medicaid Other | Attending: Emergency Medicine | Admitting: Emergency Medicine

## 2014-04-02 ENCOUNTER — Emergency Department (HOSPITAL_BASED_OUTPATIENT_CLINIC_OR_DEPARTMENT_OTHER): Payer: Medicaid Other

## 2014-04-02 ENCOUNTER — Encounter (HOSPITAL_BASED_OUTPATIENT_CLINIC_OR_DEPARTMENT_OTHER): Payer: Self-pay | Admitting: Emergency Medicine

## 2014-04-02 DIAGNOSIS — Z8673 Personal history of transient ischemic attack (TIA), and cerebral infarction without residual deficits: Secondary | ICD-10-CM | POA: Insufficient documentation

## 2014-04-02 DIAGNOSIS — G40909 Epilepsy, unspecified, not intractable, without status epilepticus: Secondary | ICD-10-CM | POA: Insufficient documentation

## 2014-04-02 DIAGNOSIS — E1142 Type 2 diabetes mellitus with diabetic polyneuropathy: Secondary | ICD-10-CM | POA: Insufficient documentation

## 2014-04-02 DIAGNOSIS — Z794 Long term (current) use of insulin: Secondary | ICD-10-CM | POA: Insufficient documentation

## 2014-04-02 DIAGNOSIS — Z79899 Other long term (current) drug therapy: Secondary | ICD-10-CM | POA: Insufficient documentation

## 2014-04-02 DIAGNOSIS — M5380 Other specified dorsopathies, site unspecified: Secondary | ICD-10-CM | POA: Insufficient documentation

## 2014-04-02 DIAGNOSIS — E1149 Type 2 diabetes mellitus with other diabetic neurological complication: Secondary | ICD-10-CM | POA: Insufficient documentation

## 2014-04-02 DIAGNOSIS — M62830 Muscle spasm of back: Secondary | ICD-10-CM

## 2014-04-02 DIAGNOSIS — J45909 Unspecified asthma, uncomplicated: Secondary | ICD-10-CM | POA: Insufficient documentation

## 2014-04-02 DIAGNOSIS — Z87891 Personal history of nicotine dependence: Secondary | ICD-10-CM | POA: Insufficient documentation

## 2014-04-02 DIAGNOSIS — Z792 Long term (current) use of antibiotics: Secondary | ICD-10-CM | POA: Insufficient documentation

## 2014-04-02 DIAGNOSIS — Z880 Allergy status to penicillin: Secondary | ICD-10-CM | POA: Insufficient documentation

## 2014-04-02 DIAGNOSIS — Z8614 Personal history of Methicillin resistant Staphylococcus aureus infection: Secondary | ICD-10-CM | POA: Insufficient documentation

## 2014-04-02 DIAGNOSIS — K219 Gastro-esophageal reflux disease without esophagitis: Secondary | ICD-10-CM | POA: Insufficient documentation

## 2014-04-02 HISTORY — DX: Cerebral infarction, unspecified: I63.9

## 2014-04-02 LAB — URINALYSIS, ROUTINE W REFLEX MICROSCOPIC
Bilirubin Urine: NEGATIVE
GLUCOSE, UA: NEGATIVE mg/dL
Hgb urine dipstick: NEGATIVE
KETONES UR: NEGATIVE mg/dL
Leukocytes, UA: NEGATIVE
NITRITE: NEGATIVE
Protein, ur: 30 mg/dL — AB
Specific Gravity, Urine: 1.016 (ref 1.005–1.030)
Urobilinogen, UA: 0.2 mg/dL (ref 0.0–1.0)
pH: 6 (ref 5.0–8.0)

## 2014-04-02 LAB — URINE MICROSCOPIC-ADD ON

## 2014-04-02 MED ORDER — METHOCARBAMOL 500 MG PO TABS
1000.0000 mg | ORAL_TABLET | Freq: Once | ORAL | Status: AC
  Administered 2014-04-02: 1000 mg via ORAL
  Filled 2014-04-02: qty 2

## 2014-04-02 MED ORDER — KETOROLAC TROMETHAMINE 60 MG/2ML IM SOLN
60.0000 mg | Freq: Once | INTRAMUSCULAR | Status: AC
Start: 2014-04-02 — End: 2014-04-02
  Administered 2014-04-02: 60 mg via INTRAMUSCULAR
  Filled 2014-04-02: qty 2
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
## 2014-04-02 NOTE — ED Notes (Signed)
Patient transported to X-ray 

## 2014-04-02 NOTE — ED Notes (Signed)
MD at bedside. 
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
## 2014-04-03 ENCOUNTER — Encounter (HOSPITAL_BASED_OUTPATIENT_CLINIC_OR_DEPARTMENT_OTHER): Payer: Self-pay | Admitting: Emergency Medicine

## 2014-04-03 MED ORDER — FLUCONAZOLE 200 MG PO TABS: 200.0000 mg | ORAL_TABLET | Freq: Every day | ORAL | Status: DC

## 2014-04-03 MED ORDER — DICLOFENAC EPOLAMINE 1.3 % TD PTCH: 1.0000 | MEDICATED_PATCH | Freq: Two times a day (BID) | TRANSDERMAL | Status: DC

## 2014-04-03 MED ORDER — METHOCARBAMOL 750 MG PO TABS: 750.0000 mg | ORAL_TABLET | Freq: Three times a day (TID) | ORAL | Status: DC

## 2014-04-03 NOTE — Discharge Instructions (Signed)
Back Exercises  Back exercises help treat and prevent back injuries. The goal of back exercises is to increase the strength of your abdominal and back muscles and the flexibility of your back. These exercises should be started when you no longer have back pain. Back exercises include:  · Pelvic Tilt. Lie on your back with your knees bent. Tilt your pelvis until the lower part of your back is against the floor. Hold this position 5 to 10 sec and repeat 5 to 10 times.  · Knee to Chest. Pull first 1 knee up against your chest and hold for 20 to 30 seconds, repeat this with the other knee, and then both knees. This may be done with the other leg straight or bent, whichever feels better.  · Sit-Ups or Curl-Ups. Bend your knees 90 degrees. Start with tilting your pelvis, and do a partial, slow sit-up, lifting your trunk only 30 to 45 degrees off the floor. Take at least 2 to 3 seconds for each sit-up. Do not do sit-ups with your knees out straight. If partial sit-ups are difficult, simply do the above but with only tightening your abdominal muscles and holding it as directed.  · Hip-Lift. Lie on your back with your knees flexed 90 degrees. Push down with your feet and shoulders as you raise your hips a couple inches off the floor; hold for 10 seconds, repeat 5 to 10 times.  · Back arches. Lie on your stomach, propping yourself up on bent elbows. Slowly press on your hands, causing an arch in your low back. Repeat 3 to 5 times. Any initial stiffness and discomfort should lessen with repetition over time.  · Shoulder-Lifts. Lie face down with arms beside your body. Keep hips and torso pressed to floor as you slowly lift your head and shoulders off the floor.  Do not overdo your exercises, especially in the beginning. Exercises may cause you some mild back discomfort which lasts for a few minutes; however, if the pain is more severe, or lasts for more than 15 minutes, do not continue exercises until you see your caregiver.  Improvement with exercise therapy for back problems is slow.   See your caregivers for assistance with developing a proper back exercise program.  Document Released: 11/01/2004 Document Revised: 12/17/2011 Document Reviewed: 07/26/2011  ExitCare® Patient Information ©2015 [employer]. This information is not intended to replace advice given to you by your health care provider. Make sure you discuss any questions you have with your health care provider.

## 2014-04-03 NOTE — ED Notes (Signed)
[facility] called for transport.  

## 2014-04-05 ENCOUNTER — Telehealth: Payer: Self-pay | Admitting: Family Medicine

## 2014-04-05 NOTE — Telephone Encounter (Signed)
Pt is needing new rx for pregabalin (LYRICA) 100 MG capsule, send to [facility] on [address] and [address].  Also pt would like to know why dr. Sarajane Jews stated he would not refill his oxycodone, pt states that he has not had his first appt yet with pain management.

## 2014-04-06 ENCOUNTER — Telehealth: Payer: Self-pay | Admitting: *Deleted

## 2014-04-06 NOTE — Telephone Encounter (Signed)
Called because they need to change a medication rx that was ordered by Algis Liming. (Pharmacy opens at 9:00 am)

## 2014-04-06 NOTE — Telephone Encounter (Signed)
Due to the complicated history and needs of this pt we are moving schedule around to make appt for Monday 04/12/14 @12noon  with Dr Naaman Plummer.  Jason [name] says they will be here.  He has an ppt that day with Dr Leata Mouse office which they should be able to keep but she may try and reschedule that appt.

## 2014-04-06 NOTE — Telephone Encounter (Signed)
Have eunice write MS Contin 15mg  BID #60 Dr Sarajane Jews can RX and get pre auth for Lyrica and see him for medical f/u See Zella Ball in 1 mo

## 2014-04-06 NOTE — Telephone Encounter (Signed)
Pharmacy says Kadian 40 mg --no generic and there is zero availability of  Kadian in that dose.  He will need rx for something else.  I spoke with Jason [name] and she said that the rx was written at discharge in May (03/05/14) and wondering why it has taken so long to get to the pharmacy with rx.  She asked me to call and speak with Jason [name].  I called her and she said his medicaid was just approved and that is why the delay.  She also said that his chair has malfunctioned and that is why they had to cancel his appt 04/02/14 with Jason [name].  There was no way to transport him.  He now has the first available appt with Naaman [name] which is 06/02/14.  They have multiple issues that she was presenting (need a new order for chair because of length of time has lapsed, lyrica needs approval, bowel program issues, as well as med issues). There were too many issues to list from the phone call.  He needs to be seen but no availability.  He had tried to get Jason [name] to write for his oxycodone and he refused.  His [name] mentioned his Oxycontin but I don't see where he was ever prescribed this but once in 04/2013 and MS Contin -this was the only rx written 03/05/14 as Kadian, and his nerve medication alprazolam.  I do not see where he was ever prescribed alprazolam.  With all these issues he needs to be seen but no appointments available and this would not be appropriate for our NP at this point.. Please advise. He will at least need some form of replacement for the Kadian 40 mg.

## 2014-04-07 ENCOUNTER — Telehealth: Payer: Self-pay | Admitting: Family Medicine

## 2014-04-07 ENCOUNTER — Ambulatory Visit: Payer: Self-pay | Admitting: Family Medicine

## 2014-04-07 NOTE — Telephone Encounter (Signed)
I spoke with Tiffany and she is going to fax over the form that needs to be signed and sent back.

## 2014-04-07 NOTE — Telephone Encounter (Signed)
tiffany w/ [facility] following up on orders for skilled nursing for wound care sent on 6/25.

## 2014-04-08 ENCOUNTER — Encounter: Payer: Self-pay | Admitting: Neurology

## 2014-04-08 ENCOUNTER — Telehealth: Payer: Self-pay | Admitting: *Deleted

## 2014-04-08 ENCOUNTER — Ambulatory Visit (INDEPENDENT_AMBULATORY_CARE_PROVIDER_SITE_OTHER): Payer: Medicaid Other | Admitting: Neurology

## 2014-04-08 VITALS — BP 84/58 | HR 85

## 2014-04-08 DIAGNOSIS — G373 Acute transverse myelitis in demyelinating disease of central nervous system: Secondary | ICD-10-CM

## 2014-04-08 DIAGNOSIS — G0489 Other myelitis: Secondary | ICD-10-CM

## 2014-04-08 MED ORDER — OXYCODONE-ACETAMINOPHEN 10-325 MG PO TABS: 1.0000 | ORAL_TABLET | Freq: Four times a day (QID) | ORAL | Status: DC | PRN

## 2014-04-08 MED ORDER — PREGABALIN 100 MG PO CAPS: 100.0000 mg | ORAL_CAPSULE | Freq: Three times a day (TID) | ORAL | Status: DC

## 2014-04-08 NOTE — Telephone Encounter (Signed)
[facility] office called to request NPI number for patient.  NPI given x 1, pt needs to contact medicaid office to change provider information on card. Derl Barrow, RN

## 2014-04-08 NOTE — Telephone Encounter (Signed)
Done. He is set to see Dr. Naaman Plummer on Monday
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
## 2014-04-08 NOTE — Telephone Encounter (Signed)
Both scripts were printed and on same page, also spoke with pt. He will pick up today.

## 2014-04-12 ENCOUNTER — Encounter: Payer: Medicaid Other | Admitting: Physical Medicine & Rehabilitation

## 2014-04-12 ENCOUNTER — Ambulatory Visit: Payer: Self-pay | Admitting: Family Medicine

## 2014-04-12 LAB — NMO IGG AUTOANTIBODIES

## 2014-04-15 ENCOUNTER — Telehealth: Payer: Self-pay

## 2014-04-15 NOTE — Telephone Encounter (Signed)
T[place]ania from [employer] would like a call back. She needs some more information so patient's powered wheelchair can be ordered. Contact # [phone].

## 2014-04-15 NOTE — Telephone Encounter (Signed)
Contacted [place] @ [employer] to inform her that patient has not been seen at the clinic. [place] is faxing paperwork to [name] to be filled out by Dr. Naaman Plummer at patient's OV on 7/13.

## 2014-04-19 ENCOUNTER — Encounter: Payer: Self-pay | Admitting: Physical Medicine & Rehabilitation

## 2014-04-19 ENCOUNTER — Encounter: Payer: Medicaid Other | Attending: Physical Medicine & Rehabilitation | Admitting: Physical Medicine & Rehabilitation

## 2014-04-19 VITALS — BP 158/101 | HR 82 | Resp 14 | Ht 67.0 in | Wt 140.0 lb

## 2014-04-19 DIAGNOSIS — M545 Low back pain, unspecified: Secondary | ICD-10-CM | POA: Insufficient documentation

## 2014-04-19 DIAGNOSIS — M549 Dorsalgia, unspecified: Secondary | ICD-10-CM | POA: Diagnosis not present

## 2014-04-19 DIAGNOSIS — K592 Neurogenic bowel, not elsewhere classified: Secondary | ICD-10-CM | POA: Diagnosis not present

## 2014-04-19 DIAGNOSIS — G8250 Quadriplegia, unspecified: Secondary | ICD-10-CM | POA: Diagnosis not present

## 2014-04-19 DIAGNOSIS — J45909 Unspecified asthma, uncomplicated: Secondary | ICD-10-CM | POA: Diagnosis not present

## 2014-04-19 DIAGNOSIS — E1049 Type 1 diabetes mellitus with other diabetic neurological complication: Secondary | ICD-10-CM | POA: Diagnosis not present

## 2014-04-19 DIAGNOSIS — N319 Neuromuscular dysfunction of bladder, unspecified: Secondary | ICD-10-CM

## 2014-04-19 DIAGNOSIS — Z79899 Other long term (current) drug therapy: Secondary | ICD-10-CM | POA: Diagnosis not present

## 2014-04-19 DIAGNOSIS — E1065 Type 1 diabetes mellitus with hyperglycemia: Secondary | ICD-10-CM

## 2014-04-19 DIAGNOSIS — IMO0002 Reserved for concepts with insufficient information to code with codable children: Secondary | ICD-10-CM

## 2014-04-19 DIAGNOSIS — G9511 Acute infarction of spinal cord (embolic) (nonembolic): Secondary | ICD-10-CM

## 2014-04-19 DIAGNOSIS — K219 Gastro-esophageal reflux disease without esophagitis: Secondary | ICD-10-CM | POA: Diagnosis not present

## 2014-04-19 DIAGNOSIS — Z8673 Personal history of transient ischemic attack (TIA), and cerebral infarction without residual deficits: Secondary | ICD-10-CM | POA: Insufficient documentation

## 2014-04-19 DIAGNOSIS — K3184 Gastroparesis: Secondary | ICD-10-CM | POA: Diagnosis not present

## 2014-04-19 DIAGNOSIS — E1142 Type 2 diabetes mellitus with diabetic polyneuropathy: Secondary | ICD-10-CM | POA: Diagnosis not present

## 2014-04-19 DIAGNOSIS — G9519 Other vascular myelopathies: Secondary | ICD-10-CM

## 2014-04-19 DIAGNOSIS — E1040 Type 1 diabetes mellitus with diabetic neuropathy, unspecified: Secondary | ICD-10-CM

## 2014-04-19 MED ORDER — PREGABALIN 100 MG PO CAPS: 100.0000 mg | ORAL_CAPSULE | Freq: Four times a day (QID) | ORAL | Status: DC

## 2014-04-19 MED ORDER — OXYCODONE-ACETAMINOPHEN 10-325 MG PO TABS: 1.0000 | ORAL_TABLET | Freq: Four times a day (QID) | ORAL | Status: DC | PRN

## 2014-04-19 MED ORDER — MORPHINE SULFATE ER 60 MG PO TBCR: 60.0000 mg | EXTENDED_RELEASE_TABLET | Freq: Two times a day (BID) | ORAL | Status: DC

## 2014-04-19 NOTE — Progress Notes (Signed)
Subjective:    Patient ID: Jason [name], male    DOB: 1986-03-15, 28 y.o.   MRN: [id number]  [name] is back regarding his spinal cord infarct and tetraplegia. The primary reason for his visit is to discuss his power wheelchair. Jason [name] has tetraplegia with limited upper extremity function and complete LOSS of function in the lower extremities. Therefore, Jason [name] is unable to utilize a cane, walker or manual wheelchair as a means of locomotion. Jason [name] is also unable to utilize a scooter due to absent leg use and trunk control. Jason [name] does have the mental and physical capabilities to operate a powered wheelchair safely within his home. Therefore, Jason [name] is pursuing a custom, powered wheelchair to meet his needs. A powered wheelchair will allow Jason [name] to be independent with his mobility and to perform his ADL's much more easily at home (with assistance of mother).   Since I last saw him, Jason [name] has been hospitalized twice, once for C Diff and once for pneumonia. Jason [name] was discharged from the hospital most recently just a few days ago. Jason [name] is still on levaquin.   Jason [name] is still having pain in his upper back as well as his lower back now. Jason [name] is doing a few stretches, particularly while Jason [name]'s in bed. Sometimes Jason [name] works on stretches in his chair.  Mother reports spasms in his LE's.   Sugars have been uncontrolled especially with his recent pneumonia  Mother has also noticed increased ulceration of his penis due to his foley cath. She has tried to adjust the positioning but has not had a lot of success in reducing breakdown.   Pain Inventory Average Pain 10 Pain Right Now 7 My pain is sharp, stabbing and aching  In the last 24 hours, has pain interfered with the following? General activity 9 Relation with others 4 Enjoyment of life 8 What TIME of day is your pain at its worst? night Sleep (in general) Good  Pain is worse with: na Pain improves with: na Relief from Meds: 8  Mobility ability to  climb steps?  no do you drive?  no use a wheelchair needs help with transfers  Function disabled: date disabled na I need assistance with the following:  dressing, bathing, toileting, meal prep, household duties and shopping  Neuro/Psych weakness numbness tremor spasms anxiety  Prior Studies Any changes since last visit?  yes x-rays CT/MRI  Physicians involved in your care Any changes since last visit?  no   Family History  Problem Relation Age of Onset  . Diabetes Father   . Hypertension Father   . Asthma      fhx  . Hypertension      fhx  . Stroke      fhx  . Heart disease Mother    History   Social History  . Marital Status: Single    Spouse Name: N/A    Number of Children: 0  . Years of Education: 11   Occupational History  . Disability    Social History Main Topics  . Smoking status: Former Smoker    Types: Cigars    Quit date: 01/29/2014  . Smokeless tobacco: Never Used     Comment: e-cigarette  . Alcohol Use: No     Comment: rarely  . Drug Use: No  . Sexual Activity: None   Other Topics Concern  . None   Social History Narrative   Patient lives at home with mother father.    Patient has 2 children.  Patient is single.    Patient was left hand but after stroke patient is now right handed.    Patient has  11th grade education.    Past Surgical History  Procedure Laterality Date  . Tonsillectomy     Past Medical History  Diagnosis Date  . Diabetes mellitus   . GERD (gastroesophageal reflux disease)   . Asthma   . Hx MRSA infection     on face  . Gastroparesis   . Diabetic neuropathy   . Seizures   . Stroke     spinal stroke in 4/15   BP 158/101  Pulse 82  Resp 14  Ht 5\' 7"  (1.702 m)  Wt 140 lb (63.504 kg)  BMI 21.92 kg/m2  SpO2 100%  Opioid Risk Score:   Fall Risk Score: Low Fall Risk (0-5 points) (pt educated on fall risk, brochure given to pt)    Review of Systems  Gastrointestinal: Positive for diarrhea.    Skin: Positive for rash.  Neurological: Positive for tremors, weakness and numbness.       Spasms  Psychiatric/Behavioral: The patient is nervous/anxious.   All other systems reviewed and are negative.      Objective:   Physical Exam  HEENT: normal  Cardio: RRR and no murmur  Resp: CTA B/L and unlabored  GI: BS positive and mildly distended and tympanitic, mild tenderness  Extremity: No Edema  Skin: leg and right elbow wounds dressed.  Neuro: Alert/Oriented, Abnormal Sensory absent in feet and Abnormal Motor 0/5 in LE,tr 1/5 finger extension and tr to 1/5 hand intrinsic. Wrist extension 3, 3+/5 tricep 3-, biceps 5/5. Decreased sensation below elbows bilaterally. Minimal trunk control. Cannot deep breath to cough.     .  Musc/Skel: Generalized upper thoracic and lumbar spine tenderness.  Gen NAD. Jason [name] has gained weight Psych: generally alert, appropriate GU: ulceration of urethral meatus    Assessment/Plan:  1. 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 Chronic Pain Management: increase MS contin to 60 mg bid - heating pad for shoulder/neck symptoms, good posture when in bed and chair  -continue robaxin for muscle spasms. (consider baclofen trial) -lyrica 100mg  increase to 100mg  bid and 200mg  qhs  4. Mood: controlled.  5. Neuropsych: This patient is capable of making decisions on his own behalf.  6. Spinal cord infarct: steroids off-  7. DM type 2 with gastroparesis: insulin per pcp. Needs to work on [employer] and diet selection. 8. ChronicBLE wounds: continued wound mgt. 9. Constipation/diabetic gastroparesis/neurogenic bowel: reglan  10. Severe diabetic peripheral neuropathy: lyrica  11. Pulmonary: deep breathing, IS/FV, CPAP---was reinforced today.  -quad cough, assistive techniques  -keep hob elevated 13. CV---recommended stopping  midodrine given elevated bp.  14. Neurogenic bladder/urethral ulceration:  Have made a referral to [facility] for assessment. May need SPC  In the meantime, mother will work on relocation of catheter to avoid further breakdown  45 minutes of face to face patient care time were spent during this visit. All questions were encouraged and answered. Jason [name] will see either me or my NP in about a month.

## 2014-04-19 NOTE — Patient Instructions (Signed)
WORK ON INCENTIVE SPIROMETRY, FLUTTER VALVE, DEEP BREATHING AND POSTURE EVERY DAY!!!   NEED TO MAKE GOOD SELECTIONS WITH YOUR DIET!!!

## 2014-04-20 ENCOUNTER — Ambulatory Visit (INDEPENDENT_AMBULATORY_CARE_PROVIDER_SITE_OTHER): Payer: Medicaid Other | Admitting: Family Medicine

## 2014-04-20 ENCOUNTER — Encounter: Payer: Self-pay | Admitting: Family Medicine

## 2014-04-20 VITALS — BP 100/68 | HR 84

## 2014-04-20 DIAGNOSIS — G8250 Quadriplegia, unspecified: Secondary | ICD-10-CM

## 2014-04-20 DIAGNOSIS — E1142 Type 2 diabetes mellitus with diabetic polyneuropathy: Secondary | ICD-10-CM

## 2014-04-20 DIAGNOSIS — IMO0002 Reserved for concepts with insufficient information to code with codable children: Secondary | ICD-10-CM

## 2014-04-20 DIAGNOSIS — L97519 Non-pressure chronic ulcer of other part of right foot with unspecified severity: Secondary | ICD-10-CM

## 2014-04-20 DIAGNOSIS — E119 Type 2 diabetes mellitus without complications: Secondary | ICD-10-CM

## 2014-04-20 DIAGNOSIS — L97529 Non-pressure chronic ulcer of other part of left foot with unspecified severity: Secondary | ICD-10-CM

## 2014-04-20 DIAGNOSIS — E1049 Type 1 diabetes mellitus with other diabetic neurological complication: Secondary | ICD-10-CM

## 2014-04-20 DIAGNOSIS — Z794 Long term (current) use of insulin: Secondary | ICD-10-CM

## 2014-04-20 DIAGNOSIS — E1369 Other specified diabetes mellitus with other specified complication: Secondary | ICD-10-CM

## 2014-04-20 DIAGNOSIS — L97509 Non-pressure chronic ulcer of other part of unspecified foot with unspecified severity: Secondary | ICD-10-CM

## 2014-04-20 DIAGNOSIS — E08621 Diabetes mellitus due to underlying condition with foot ulcer: Secondary | ICD-10-CM

## 2014-04-20 DIAGNOSIS — K219 Gastro-esophageal reflux disease without esophagitis: Secondary | ICD-10-CM

## 2014-04-20 DIAGNOSIS — G9519 Other vascular myelopathies: Secondary | ICD-10-CM

## 2014-04-20 DIAGNOSIS — N319 Neuromuscular dysfunction of bladder, unspecified: Secondary | ICD-10-CM

## 2014-04-20 DIAGNOSIS — G9511 Acute infarction of spinal cord (embolic) (nonembolic): Secondary | ICD-10-CM

## 2014-04-20 DIAGNOSIS — J690 Pneumonitis due to inhalation of food and vomit: Secondary | ICD-10-CM

## 2014-04-20 DIAGNOSIS — E1040 Type 1 diabetes mellitus with diabetic neuropathy, unspecified: Secondary | ICD-10-CM

## 2014-04-20 DIAGNOSIS — E1065 Type 1 diabetes mellitus with hyperglycemia: Principal | ICD-10-CM

## 2014-04-20 DIAGNOSIS — IMO0001 Reserved for inherently not codable concepts without codable children: Secondary | ICD-10-CM

## 2014-04-20 MED ORDER — GLUCAGON (RDNA) 1 MG IJ KIT: 1.0000 mg | PACK | Freq: Once | INTRAMUSCULAR | Status: DC | PRN

## 2014-04-20 MED ORDER — ONDANSETRON HCL 8 MG PO TABS: 8.0000 mg | ORAL_TABLET | Freq: Two times a day (BID) | ORAL | Status: DC | PRN

## 2014-04-20 NOTE — Progress Notes (Signed)
   Subjective:    Patient ID: Jason [name], male    DOB: 03-21-1986, 28 y.o.   MRN: [id number]  HPI Here to follow up after a lengthy absence. He has had many health issues and his overall condition has deteriorated dramatically. He is here with his mother who is also serving as his Systems developer. He sees Dr. Jim Like for Neurology care, and the spinal infarction at the C5-C6 level has been a very serious setback for him. He has been in a motorized wheelchair since then, and he has been getting some informal PT at home. He has not qualified for any PT or OT at home under Medicaid unfortunately. He has been hospitalized twice recently for aspiration pneumonias, once from 03-16-14 to 03-21-14 at [facility] and again from 04-09-14 to 04-15-14 at [facility] He has responded well to Levaquin each time. He is drinking water and eating a healthy diet since then, and his diabetes has been remarkably stable. His last A1c on 03-19-14 was 8.5 ( which is down from 12 a few months ago). He is seeing Dr. Ephriam Knuckles for rehab and pain management, and yesterday his MS Contin was increased from 40 mg bid to 80 mg bid. He also takes Percocet and Lyrica, and this combination has helped the pain a lot. However he has been very sedated and has trouble sleeping a nap. Also he has had an indwelling foley catheter for urinary retention, and he has had repeated UTIs. They ask if this could be changed to a suprapubic catheter, and in fact Dr. Ephriam Knuckles has already set up a referral to Urology for him.    Review of Systems  Constitutional: Negative.   Eyes: Negative.   Respiratory: Negative.   Cardiovascular: Negative.   Genitourinary: Positive for flank pain and difficulty urinating. Negative for dysuria and enuresis.       Objective:   Physical Exam  Constitutional:  In his motorized wheelchair, very groggy and sedated, mostly unable to answer any questions, he sleeps and snores loudly through most of the visit    Cardiovascular: Normal rate, regular rhythm, normal heart sounds and intact distal pulses.   Pulmonary/Chest: Effort normal. He has no rales.  Diffuse rhonchi   Abdominal: Soft. Bowel sounds are normal. He exhibits no distension. There is no tenderness. There is no rebound and no guarding.          Assessment & Plan:  His diabetes seems to be fairly stable at this time, and his mother is monitoring his diet closely. He is set to see Urology soon to consider switching to a suprapubic catheter. He is overly sedated and this is resulting from taking too much pain medication. I suggested that his mother hold the Percocet as much as possible while he takes the BID MS Contin. He will follow up with Dr. [name] in 2 weeks. The major threat to his health and life right now is his respiratory status, and he remains at great risk of more aspiration pneumonia. Hopefully using the least pain medication as possible will help this. He needs to sit upright in his chair as much as possible and to avoid reclining. His mother is giving him chest PT several times a day.

## 2014-04-20 NOTE — Progress Notes (Signed)
Pre visit review using our clinic review tool, if applicable. No additional management support is needed unless otherwise documented below in the visit note. 

## 2014-04-21 ENCOUNTER — Telehealth: Payer: Self-pay | Admitting: *Deleted

## 2014-04-21 NOTE — Telephone Encounter (Signed)
Called patient and left a message that labs were normal.

## 2014-04-21 NOTE — Telephone Encounter (Signed)
Message copied by Vivi Barrack on Wed Apr 21, 2014  8:37 AM ------      Message from: Drema Dallas      Created: Tue Apr 20, 2014  5:11 PM       Please let him know that his lab test was normal. Thanks. ------

## 2014-04-27 ENCOUNTER — Telehealth: Payer: Self-pay | Admitting: Family Medicine

## 2014-04-27 DIAGNOSIS — L8993 Pressure ulcer of unspecified site, stage 3: Secondary | ICD-10-CM

## 2014-04-27 MED ORDER — ALBUTEROL SULFATE (2.5 MG/3ML) 0.083% IN NEBU: 2.5000 mg | INHALATION_SOLUTION | RESPIRATORY_TRACT | Status: DC | PRN

## 2014-04-27 MED ORDER — LEVOFLOXACIN 500 MG PO TABS: 500.0000 mg | ORAL_TABLET | Freq: Every day | ORAL | Status: DC

## 2014-04-27 NOTE — Telephone Encounter (Signed)
Mom states [facility] told her the nebulizer has been denied. Mom thinks pt needs this to get the stuff out of his lungs. Does not want pt to end up back in the hosp. Mom also wants an order for an O2 monitor to monitor pt's O2 stats

## 2014-04-27 NOTE — Telephone Encounter (Signed)
I sent script e-scribe and spoke with Claiborne Billings from [facility]. Pt's mom has reduced the pain medication. What about wound care orders?

## 2014-04-27 NOTE — Telephone Encounter (Signed)
Call in another 10 days of Levaquin 500 mg daily. Also ask his mother if his pain meds have been cut back or not (as we discussed)

## 2014-04-27 NOTE — Telephone Encounter (Signed)
Nurse calling to report findings after visit with patient today:  Vitals - BP 88/62, temp 99.8, oxygen level in the 83-84 range, pt not complaining of shortness of breath.  However, he was recently hospitalized for pneumonia.  He has completed Levequin.  Pt also has several wounds and mom reports wound on buttock seems to be getting worse.

## 2014-04-27 NOTE — Telephone Encounter (Signed)
I faxed over order to [facility] at [phone], sent nebulizer solution e-scribe to pharmacy, spoke with pt's mom & [facility] Linus Salmons ).

## 2014-04-27 NOTE — Telephone Encounter (Signed)
I did a referral to the [facility]. Give verbal orders to [facility] for an oximeter to use at home and for a nebulizer with albuterol to use at home for chronic aspiration pneumonia

## 2014-05-06 ENCOUNTER — Ambulatory Visit: Payer: Medicaid Other | Admitting: Occupational Therapy

## 2014-05-06 ENCOUNTER — Ambulatory Visit: Payer: Medicaid Other | Attending: Neurology | Admitting: Physical Therapy

## 2014-05-06 DIAGNOSIS — G9519 Other vascular myelopathies: Secondary | ICD-10-CM | POA: Diagnosis not present

## 2014-05-06 DIAGNOSIS — M629 Disorder of muscle, unspecified: Secondary | ICD-10-CM | POA: Diagnosis not present

## 2014-05-06 DIAGNOSIS — R279 Unspecified lack of coordination: Secondary | ICD-10-CM | POA: Insufficient documentation

## 2014-05-06 DIAGNOSIS — M6281 Muscle weakness (generalized): Secondary | ICD-10-CM | POA: Diagnosis not present

## 2014-05-06 DIAGNOSIS — K3184 Gastroparesis: Secondary | ICD-10-CM | POA: Diagnosis not present

## 2014-05-06 DIAGNOSIS — K219 Gastro-esophageal reflux disease without esophagitis: Secondary | ICD-10-CM | POA: Insufficient documentation

## 2014-05-06 DIAGNOSIS — M2420 Disorder of ligament, unspecified site: Secondary | ICD-10-CM | POA: Insufficient documentation

## 2014-05-06 DIAGNOSIS — E109 Type 1 diabetes mellitus without complications: Secondary | ICD-10-CM | POA: Diagnosis not present

## 2014-05-06 DIAGNOSIS — R5381 Other malaise: Secondary | ICD-10-CM | POA: Diagnosis not present

## 2014-05-06 DIAGNOSIS — Z5189 Encounter for other specified aftercare: Secondary | ICD-10-CM | POA: Insufficient documentation

## 2014-05-06 DIAGNOSIS — M62838 Other muscle spasm: Secondary | ICD-10-CM | POA: Insufficient documentation

## 2014-05-06 DIAGNOSIS — J45909 Unspecified asthma, uncomplicated: Secondary | ICD-10-CM | POA: Insufficient documentation

## 2014-05-10 ENCOUNTER — Telehealth: Payer: Self-pay | Admitting: Family Medicine

## 2014-05-10 ENCOUNTER — Other Ambulatory Visit (HOSPITAL_COMMUNITY): Payer: Self-pay | Admitting: Urology

## 2014-05-10 ENCOUNTER — Telehealth: Payer: Self-pay | Admitting: Internal Medicine

## 2014-05-10 DIAGNOSIS — N1330 Unspecified hydronephrosis: Secondary | ICD-10-CM

## 2014-05-10 DIAGNOSIS — E1065 Type 1 diabetes mellitus with hyperglycemia: Principal | ICD-10-CM

## 2014-05-10 DIAGNOSIS — IMO0002 Reserved for concepts with insufficient information to code with codable children: Secondary | ICD-10-CM

## 2014-05-10 DIAGNOSIS — E1040 Type 1 diabetes mellitus with diabetic neuropathy, unspecified: Secondary | ICD-10-CM

## 2014-05-10 NOTE — Telephone Encounter (Signed)
I spoke with Jason [name] and pt has orders from 05/07/14 through 07/05/14 they had the new orders for the catheter care. I spoke with Jason [name] pt's mom and gave this information. Also the referral was done.

## 2014-05-10 NOTE — Telephone Encounter (Signed)
Left message for patient to call back  

## 2014-05-10 NOTE — Telephone Encounter (Signed)
Pt's mom calling to request the following:  1.  Due to pt being hospitalized 7/21 - 7/28 at [place] states they need a new order to resume care.  2.  Mother states [facility] Needs clarification on orders for catheter as well as clarification of current meds.    3. Pt needs referral to Dr. Loanne Drilling, for diabetes.

## 2014-05-10 NOTE — Telephone Encounter (Signed)
Referral was done. Give verbal orders to resume home health visits. What clarification do they need?

## 2014-05-10 NOTE — Telephone Encounter (Signed)
Mom reports patient is having diarrhea 5-6 times a day, very watery.  He is a paraplegic according to Mom and he is now having skin break down due to the constans diarrhea.   He has been tested several times for c-diff and all have been negative,  He has had several rounds of antibiotics recently due to pneumonia.  He will come in and see Tye Savoy RNP on 05/13/14 3:00

## 2014-05-11 ENCOUNTER — Encounter (HOSPITAL_BASED_OUTPATIENT_CLINIC_OR_DEPARTMENT_OTHER): Payer: Medicaid Other | Attending: General Surgery

## 2014-05-11 ENCOUNTER — Ambulatory Visit: Payer: Medicaid Other | Admitting: Family Medicine

## 2014-05-12 ENCOUNTER — Encounter: Payer: Self-pay | Admitting: Physical Medicine & Rehabilitation

## 2014-05-13 ENCOUNTER — Encounter: Payer: Self-pay | Admitting: Nurse Practitioner

## 2014-05-13 ENCOUNTER — Ambulatory Visit (INDEPENDENT_AMBULATORY_CARE_PROVIDER_SITE_OTHER): Payer: Medicaid Other | Admitting: Nurse Practitioner

## 2014-05-13 VITALS — BP 118/58 | HR 84

## 2014-05-13 DIAGNOSIS — R197 Diarrhea, unspecified: Secondary | ICD-10-CM

## 2014-05-13 MED ORDER — METRONIDAZOLE 250 MG PO TABS: 250.0000 mg | ORAL_TABLET | Freq: Three times a day (TID) | ORAL | Status: DC

## 2014-05-13 MED ORDER — DIPHENOXYLATE-ATROPINE 2.5-0.025 MG PO TABS
ORAL_TABLET | ORAL | Status: DC
Start: 2014-05-13 — End: 2014-05-28

## 2014-05-13 NOTE — Patient Instructions (Signed)
Please go to the basement level for a stool study test. We faxed a prescription for Lomotil to [facility] [address]/ [employer]. We sent a prescription electronically to [facility].  Stop Imodium. Call us when you are done with the Flagyl ( Metronidazole) and let us know if your diarrhea is better.

## 2014-05-14 ENCOUNTER — Other Ambulatory Visit: Payer: Medicaid Other

## 2014-05-14 DIAGNOSIS — E1065 Type 1 diabetes mellitus with hyperglycemia: Secondary | ICD-10-CM | POA: Diagnosis not present

## 2014-05-14 DIAGNOSIS — G9519 Other vascular myelopathies: Secondary | ICD-10-CM

## 2014-05-14 DIAGNOSIS — E1049 Type 1 diabetes mellitus with other diabetic neurological complication: Secondary | ICD-10-CM | POA: Diagnosis not present

## 2014-05-14 DIAGNOSIS — N319 Neuromuscular dysfunction of bladder, unspecified: Secondary | ICD-10-CM

## 2014-05-14 DIAGNOSIS — G8252 Quadriplegia, C1-C4 incomplete: Secondary | ICD-10-CM

## 2014-05-14 DIAGNOSIS — S51009A Unspecified open wound of unspecified elbow, initial encounter: Secondary | ICD-10-CM

## 2014-05-14 DIAGNOSIS — E1142 Type 2 diabetes mellitus with diabetic polyneuropathy: Secondary | ICD-10-CM

## 2014-05-14 DIAGNOSIS — K3184 Gastroparesis: Secondary | ICD-10-CM | POA: Diagnosis not present

## 2014-05-14 DIAGNOSIS — R197 Diarrhea, unspecified: Secondary | ICD-10-CM

## 2014-05-14 DIAGNOSIS — L97509 Non-pressure chronic ulcer of other part of unspecified foot with unspecified severity: Secondary | ICD-10-CM

## 2014-05-14 DIAGNOSIS — J45909 Unspecified asthma, uncomplicated: Secondary | ICD-10-CM

## 2014-05-14 DIAGNOSIS — J690 Pneumonitis due to inhalation of food and vomit: Secondary | ICD-10-CM | POA: Diagnosis not present

## 2014-05-14 DIAGNOSIS — E1069 Type 1 diabetes mellitus with other specified complication: Secondary | ICD-10-CM

## 2014-05-14 DIAGNOSIS — IMO0002 Reserved for concepts with insufficient information to code with codable children: Secondary | ICD-10-CM | POA: Diagnosis not present

## 2014-05-14 NOTE — Progress Notes (Addendum)
     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 He does have some use of his upper extremities. He has had loss of bowel and bladder control. Patient is known to [name] for his history of chronic nausea and vomiting related to gastroparesis.  Patient comes in with his mother for evaluation of loose stool which started following hospitalization for transverse myelitis. Prior to that hospitalization patient's bowel movements were formed, even on metoclopramide. Not only have stools been loose over the last few months but lately they've increased in frequency. Patient is incontinent of several loose bowel movements a day. He has been on multiple antibiotics over the last few months for recurrent pneumonia. C. Difficile studies negative in May and June. Mother describes the stools as large volume," sickly" smelling.  Diarrhea contributing to skin breakdown. Imodium nor brat diet has helped. He is on twice daily probiotics.  No abdominal pain. No fevers. Patient takes metoclopramide at nighttime every other day. He has been on this dose since June of last year without any previous problems with loose stools.   Current Medications, Allergies, Past Medical History, Past Surgical History, Family History and Social History were reviewed in [employer] record.   Physical Exam: General: Pleasant, black male in wheelchair in no acute distress Head: Normocephalic and atraumatic Eyes:  sclerae anicteric, conjunctiva pink  Ears: Normal auditory acuity Lungs: Clear throughout to auscultation Heart: Regular rate and rhythm Abdomen: Limited exam in wheelchair. Soft, non-tender. Normal bowel sounds Musculoskeletal: Symmetrical with no gross deformities  Extremities: No edema  Neurological: Alert oriented x 4, grossly nonfocal Psychological:  Alert  and cooperative. Normal mood and affect  Assessment and Recommendations:  89. 28 year old male with paraplegia and very limited use of upper extremities as well. 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 patient has bowel and bladder incontinence, he has an indwelling Foley.  2. Loose stool. This started after hospitalization for #1. Patient has been on prolonged antibiotics for recurrent pneumonia. C. Difficile studies negative in May and June. He is currently on Levaquin. Loose stools have increased in frequency over the last few weeks . They are high volume so doubt this is overflow diarrhea. He is incontinent of at least 5 loose stools a day. Would not think metoclopramide is culprit as he only takes 5 mg at bedtime every other day and this has been his dose for over a year.  Stools have increased in frequency, he is taking Levaquin and it has been almost 8 weeks since stool checked for C. Difficile. Will recheck for C. Difficile  Proton pump inhibitors can contribute to diarrhea and there are two listed on his home med list. I clarified this with the patient and he only takes  Zegerid if he runs of of [place].   Discontinue Imodium as it is ineffective. Trial of Lomotil.   Continue probiotics  Will treat empirically with a course of Flagyl for small intestine bacterial overgrowth  Patient will call in 7-10 days with a condition update.  Addendum: Reviewed and agree with initial management. Jerene Bears, MD

## 2014-05-16 LAB — CLOSTRIDIUM DIFFICILE BY PCR: CDIFFPCR: NOT DETECTED

## 2014-05-20 ENCOUNTER — Encounter: Payer: Self-pay | Admitting: Registered Nurse

## 2014-05-20 ENCOUNTER — Encounter (HOSPITAL_COMMUNITY): Payer: Self-pay

## 2014-05-20 ENCOUNTER — Encounter: Payer: Medicaid Other | Attending: Registered Nurse | Admitting: Registered Nurse

## 2014-05-20 ENCOUNTER — Ambulatory Visit (HOSPITAL_COMMUNITY)
Admission: RE | Admit: 2014-05-20 | Discharge: 2014-05-20 | Disposition: A | Discharge status: Home / Self Care | Payer: Medicaid Other | Source: Ambulatory Visit | Attending: Urology | Admitting: Urology

## 2014-05-20 VITALS — BP 101/63 | HR 99 | Resp 14 | Ht 67.0 in | Wt 141.0 lb

## 2014-05-20 DIAGNOSIS — G8250 Quadriplegia, unspecified: Secondary | ICD-10-CM | POA: Diagnosis present

## 2014-05-20 DIAGNOSIS — G9511 Acute infarction of spinal cord (embolic) (nonembolic): Secondary | ICD-10-CM

## 2014-05-20 DIAGNOSIS — E1065 Type 1 diabetes mellitus with hyperglycemia: Secondary | ICD-10-CM | POA: Diagnosis not present

## 2014-05-20 DIAGNOSIS — E1149 Type 2 diabetes mellitus with other diabetic neurological complication: Secondary | ICD-10-CM | POA: Insufficient documentation

## 2014-05-20 DIAGNOSIS — G9519 Other vascular myelopathies: Secondary | ICD-10-CM | POA: Insufficient documentation

## 2014-05-20 DIAGNOSIS — N319 Neuromuscular dysfunction of bladder, unspecified: Secondary | ICD-10-CM

## 2014-05-20 DIAGNOSIS — Z79899 Other long term (current) drug therapy: Secondary | ICD-10-CM | POA: Diagnosis present

## 2014-05-20 DIAGNOSIS — E1142 Type 2 diabetes mellitus with diabetic polyneuropathy: Secondary | ICD-10-CM | POA: Insufficient documentation

## 2014-05-20 DIAGNOSIS — E1049 Type 1 diabetes mellitus with other diabetic neurological complication: Secondary | ICD-10-CM | POA: Insufficient documentation

## 2014-05-20 DIAGNOSIS — N200 Calculus of kidney: Secondary | ICD-10-CM | POA: Insufficient documentation

## 2014-05-20 DIAGNOSIS — Z5181 Encounter for therapeutic drug level monitoring: Secondary | ICD-10-CM | POA: Diagnosis present

## 2014-05-20 DIAGNOSIS — N1330 Unspecified hydronephrosis: Secondary | ICD-10-CM

## 2014-05-20 MED ORDER — OXYCODONE-ACETAMINOPHEN 10-325 MG PO TABS: 1.0000 | ORAL_TABLET | Freq: Four times a day (QID) | ORAL | Status: DC | PRN

## 2014-05-20 MED ORDER — LIDOCAINE 5 % EX PTCH: 1.0000 | MEDICATED_PATCH | CUTANEOUS | Status: DC

## 2014-05-20 NOTE — Progress Notes (Signed)
Subjective:    Patient ID: Jason [name], male    DOB: 1986-05-21, 28 y.o.   MRN: [id number]  HPI: [name] is a 28 year old male who returns for chronic pain and medication refill. He says his pain is located in his bilateral arms, hands, lower back and bilateral extremities. He rates his pain 5. He's not following an exercise routine. His mother in the room. On July 21,2015 he was found in his chair unresponsive at his home. His mother performed CPR. EMS was called he was taken to [facility] and was intubated and  admitted. His discharge diagnosis: 1. Status Post Respiratory Distress 2. Acute Respiratory Failure 3.Sepsis 4.Pneumonia 5. Leukocytosis 6. Insulin-Dependent DM with sever hyperglycemia 7.HTN 8.Chronic Indwelling Foley Catheter 9.Paraplegia from a history of spinal cord infarction. 10. Hyponatremia 11. Hypokalemia While in the hospital his blood glucose was over 600 on IV Insulin, his glucose came down to 196.  Jason [name] sign out against medical advice, [facility] stated they felt he was not clinically ready for discharge. His mother supported his decision.  He was discharged on 05/04/2014.  While he was admitted they didn't administer his MS Contin , this medication wasn't resume upon discharge as well. I explain I will speak to Dr. Naaman Plummer regarding his medication regime. Will reorder his Percocet at this time. They verbalized understanding.  He arrived in his motorized wheelchair. He will be starting outpatient rehabilitation on 05/21/2014 Pain Inventory Average Pain 9 Pain Right Now 5 My pain is constant and dull  In the last 24 hours, has pain interfered with the following? General activity 9 Relation with others 4 Enjoyment of life 8 What TIME of day is your pain at its worst? night Sleep (in general) Good  Pain is worse with: sitting and inactivity Pain improves with: medication Relief from Meds: 8  Mobility ability to  climb steps?  no do you drive?  no use a wheelchair needs help with transfers  Function disabled: date disabled 01/29/14 I need assistance with the following:  toileting, meal prep and shopping  Neuro/Psych bladder control problems bowel control problems weakness numbness tremor spasms  Prior Studies Any changes since last visit?  no  Physicians involved in your care Any changes since last visit?  no   Family History  Problem Relation Age of Onset  . Diabetes Father   . Hypertension Father   . Asthma      fhx  . Hypertension      fhx  . Stroke      fhx  . Heart disease Mother    History   Social History  . Marital Status: Single    Spouse Name: N/A    Number of Children: 0  . Years of Education: 11   Occupational History  . Disability    Social History Main Topics  . Smoking status: Former Smoker    Types: Cigars    Quit date: 01/29/2014  . Smokeless tobacco: Never Used     Comment: e-cigarette  . Alcohol Use: No     Comment: rarely  . Drug Use: No  . Sexual Activity: None   Other Topics Concern  . None   Social History Narrative   Patient lives at home with mother father.    Patient has 2 children.    Patient is single.    Patient was left hand but after stroke patient is now right handed.    Patient has  11th grade education.    Past Surgical History  Procedure Laterality Date  . Tonsillectomy     Past Medical History  Diagnosis Date  . Diabetes mellitus   . GERD (gastroesophageal reflux disease)   . Asthma   . Hx MRSA infection     on face  . Gastroparesis   . Diabetic neuropathy   . Seizures   . Stroke     spinal stroke in 4/15   BP 101/63  Pulse 99  Resp 14  Ht 5\' 7"  (8.469 m)  Wt 141 lb (63.957 kg)  BMI 22.08 kg/m2  SpO2 99%  Opioid Risk Score:   Fall Risk Score: Low Fall Risk (0-5 points) (pt educated declined brochure)   Review of Systems  Respiratory: Positive for apnea.        Respiratory infections    Endocrine:       High and low blood sugar  Genitourinary:       Bowel and bladder control problems  Skin: Positive for rash.  Neurological: Positive for tremors, weakness and numbness.       Spasms  All other systems reviewed and are negative.      Objective:   Physical Exam  Nursing note and vitals reviewed. Constitutional: He is oriented to person, place, and time.  Thin Stature  HENT:  Head: Normocephalic and atraumatic.  Neck: Normal range of motion. Neck supple.  Cardiovascular: Normal rate and regular rhythm.   Pulmonary/Chest: Effort normal and breath sounds normal.  Genitourinary:  Continuous Foley Catheter  Musculoskeletal:  Upper Extremities: Full ROM and Muscle Strength 2/5 Lower Extremities: Paraplegic Motorized Wheelchair  Neurological: He is alert and oriented to person, place, and time.  Skin: Skin is warm and dry.  Open area on Left Shin with Tegaderm Left Patella Dressing Intact  Right Arm with Kerlix Dressing intact          Assessment & Plan:  1. 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 Chronic Pain Management: Refilled: Oxycodone 10/325 mg one tablet every 6 hours as needed #60. Will speak with Dr. Naaman Plummer on Monday 05/24/2014. Continue Lyrica, Voltaren gel, and Robaxin 3. Spinal cord infarct: Starting Outpatient Physical Therapy 05/21/2014 4. DM type 2 with gastroparesis: PCP Following 5. ChronicBLE wounds:  Wound Care Following/ PCP  6. Severe diabetic peripheral neuropathy: Continue Lyrica 7. Pulmonary:S/P Respiratory Arrest. MS Contin was discontinued. Continue with Pulmonary Toileting  8. Neurogenic bladder/urethral ulceration: Continuous Foley Catheter/ Urology Following  82minutes of face to face patient care time was spent during this visit. All questions were encouraged and answered.  F/U 1 month.

## 2014-05-21 ENCOUNTER — Ambulatory Visit: Payer: Medicaid Other | Admitting: Physical Therapy

## 2014-05-21 ENCOUNTER — Ambulatory Visit: Payer: Medicaid Other | Attending: Neurology | Admitting: Occupational Therapy

## 2014-05-21 DIAGNOSIS — M62838 Other muscle spasm: Secondary | ICD-10-CM | POA: Insufficient documentation

## 2014-05-21 DIAGNOSIS — E109 Type 1 diabetes mellitus without complications: Secondary | ICD-10-CM | POA: Diagnosis not present

## 2014-05-21 DIAGNOSIS — K219 Gastro-esophageal reflux disease without esophagitis: Secondary | ICD-10-CM | POA: Diagnosis not present

## 2014-05-21 DIAGNOSIS — G9519 Other vascular myelopathies: Secondary | ICD-10-CM | POA: Diagnosis not present

## 2014-05-21 DIAGNOSIS — J45909 Unspecified asthma, uncomplicated: Secondary | ICD-10-CM | POA: Diagnosis not present

## 2014-05-21 DIAGNOSIS — R5381 Other malaise: Secondary | ICD-10-CM | POA: Insufficient documentation

## 2014-05-21 DIAGNOSIS — M2420 Disorder of ligament, unspecified site: Secondary | ICD-10-CM | POA: Insufficient documentation

## 2014-05-21 DIAGNOSIS — R279 Unspecified lack of coordination: Secondary | ICD-10-CM | POA: Insufficient documentation

## 2014-05-21 DIAGNOSIS — K3184 Gastroparesis: Secondary | ICD-10-CM | POA: Diagnosis not present

## 2014-05-21 DIAGNOSIS — M629 Disorder of muscle, unspecified: Secondary | ICD-10-CM | POA: Diagnosis not present

## 2014-05-21 DIAGNOSIS — Z5189 Encounter for other specified aftercare: Secondary | ICD-10-CM | POA: Diagnosis present

## 2014-05-21 DIAGNOSIS — M6281 Muscle weakness (generalized): Secondary | ICD-10-CM | POA: Insufficient documentation

## 2014-05-24 ENCOUNTER — Telehealth: Payer: Self-pay | Admitting: Registered Nurse

## 2014-05-24 MED ORDER — OXYCODONE-ACETAMINOPHEN 10-325 MG PO TABS: 1.0000 | ORAL_TABLET | Freq: Three times a day (TID) | ORAL | Status: DC | PRN

## 2014-05-24 NOTE — Telephone Encounter (Signed)
Spoke with Dr. Naaman Plummer regarding Analgesics. We will continue with percocets at this time. Jason [name] is taking three times a day. We will bridge the prescription until he sees Dr. Naaman Plummer on 8/26. She verbalizes understanding

## 2014-05-25 ENCOUNTER — Ambulatory Visit (INDEPENDENT_AMBULATORY_CARE_PROVIDER_SITE_OTHER): Payer: Medicaid Other | Admitting: Family Medicine

## 2014-05-25 ENCOUNTER — Encounter: Payer: Self-pay | Admitting: Family Medicine

## 2014-05-25 VITALS — BP 87/62 | HR 94 | Temp 98.5°F

## 2014-05-25 DIAGNOSIS — E119 Type 2 diabetes mellitus without complications: Secondary | ICD-10-CM

## 2014-05-25 DIAGNOSIS — Z794 Long term (current) use of insulin: Secondary | ICD-10-CM

## 2014-05-25 DIAGNOSIS — J45909 Unspecified asthma, uncomplicated: Secondary | ICD-10-CM

## 2014-05-25 DIAGNOSIS — J189 Pneumonia, unspecified organism: Secondary | ICD-10-CM

## 2014-05-25 DIAGNOSIS — N319 Neuromuscular dysfunction of bladder, unspecified: Secondary | ICD-10-CM

## 2014-05-25 DIAGNOSIS — G8250 Quadriplegia, unspecified: Secondary | ICD-10-CM

## 2014-05-25 DIAGNOSIS — IMO0001 Reserved for inherently not codable concepts without codable children: Secondary | ICD-10-CM

## 2014-05-25 LAB — CBC WITH DIFFERENTIAL/PLATELET
BASOS PCT: 1 % (ref 0.0–3.0)
Basophils Absolute: 0.1 10*3/uL (ref 0.0–0.1)
Eosinophils Absolute: 0.5 10*3/uL (ref 0.0–0.7)
Eosinophils Relative: 5.5 % — ABNORMAL HIGH (ref 0.0–5.0)
HCT: 34.1 % — ABNORMAL LOW (ref 39.0–52.0)
Hemoglobin: 10.9 g/dL — ABNORMAL LOW (ref 13.0–17.0)
LYMPHS PCT: 37.6 % (ref 12.0–46.0)
Lymphs Abs: 3.2 10*3/uL (ref 0.7–4.0)
MCHC: 31.9 g/dL (ref 30.0–36.0)
MCV: 77.6 fl — ABNORMAL LOW (ref 78.0–100.0)
MONO ABS: 0.7 10*3/uL (ref 0.1–1.0)
Monocytes Relative: 7.8 % (ref 3.0–12.0)
NEUTROS PCT: 48.1 % (ref 43.0–77.0)
Neutro Abs: 4.1 10*3/uL (ref 1.4–7.7)
PLATELETS: 357 10*3/uL (ref 150.0–400.0)
RBC: 4.4 Mil/uL (ref 4.22–5.81)
RDW: 17.7 % — ABNORMAL HIGH (ref 11.5–15.5)
WBC: 8.4 10*3/uL (ref 4.0–10.5)

## 2014-05-25 LAB — BASIC METABOLIC PANEL
BUN: 17 mg/dL (ref 6–23)
CALCIUM: 11 mg/dL — AB (ref 8.4–10.5)
CO2: 30 mEq/L (ref 19–32)
CREATININE: 0.8 mg/dL (ref 0.4–1.5)
Chloride: 102 mEq/L (ref 96–112)
GFR: 146.09 mL/min (ref >60.00)
Glucose, Bld: 109 mg/dL — ABNORMAL HIGH (ref 70–99)
Potassium: 4.4 mEq/L (ref 3.5–5.1)
Sodium: 139 mEq/L (ref 135–145)

## 2014-05-25 MED ORDER — PREGABALIN 100 MG PO CAPS: ORAL_CAPSULE | ORAL | Status: DC

## 2014-05-25 MED ORDER — LIDOCAINE HCL 2 % EX GEL: Freq: Four times a day (QID) | CUTANEOUS | Status: DC | PRN

## 2014-05-25 NOTE — Progress Notes (Signed)
Pre visit review using our clinic review tool, if applicable. No additional management support is needed unless otherwise documented below in the visit note. 

## 2014-05-25 NOTE — Progress Notes (Signed)
   Subjective:    Patient ID: Jason [name], male    DOB: October 26, 1985, 28 y.o.   MRN: [id number]  HPI Here to follow up a hospital stay from 03-16-14 to 03-21-14 for aspiration pneumonia. He was intubated at first and then was extubated. He recovered nicely with antibiotics, and he was sent home on a course of Levaquin. He has finished this and he feels much better. No coughing or fevers, no chest pain or SOB. He is seeing Dr. Naaman Plummer for pain management and for rehab from his spinal chord stroke. He is seeing Dr. Loanne Drilling later this week for his diabetes. His appetite is excellent and he has put on some weight.    Review of Systems  Constitutional: Negative.   Respiratory: Negative.   Cardiovascular: Negative.   Gastrointestinal: Negative.        Objective:   Physical Exam  Constitutional: He is oriented to person, place, and time.  In his power wheelchair, alert  Neck: No thyromegaly present.  Cardiovascular: Normal rate, regular rhythm, normal heart sounds and intact distal pulses.   Pulmonary/Chest: Effort normal and breath sounds normal. No respiratory distress. He has no wheezes. He has no rales.  Lymphadenopathy:    He has no cervical adenopathy.  Neurological: He is alert and oriented to person, place, and time.          Assessment & Plan:  His pneumonia seems to have resolved. We will set up another CXR soon. Get a CBC and BMET today.

## 2014-05-26 ENCOUNTER — Encounter: Payer: Self-pay | Admitting: Endocrinology

## 2014-05-26 ENCOUNTER — Ambulatory Visit (INDEPENDENT_AMBULATORY_CARE_PROVIDER_SITE_OTHER): Payer: Medicaid Other | Admitting: Endocrinology

## 2014-05-26 ENCOUNTER — Telehealth: Payer: Self-pay | Admitting: Family Medicine

## 2014-05-26 VITALS — BP 110/60 | HR 96 | Temp 98.3°F

## 2014-05-26 DIAGNOSIS — E1142 Type 2 diabetes mellitus with diabetic polyneuropathy: Secondary | ICD-10-CM

## 2014-05-26 DIAGNOSIS — E1065 Type 1 diabetes mellitus with hyperglycemia: Principal | ICD-10-CM

## 2014-05-26 DIAGNOSIS — E1049 Type 1 diabetes mellitus with other diabetic neurological complication: Secondary | ICD-10-CM

## 2014-05-26 DIAGNOSIS — E1040 Type 1 diabetes mellitus with diabetic neuropathy, unspecified: Secondary | ICD-10-CM

## 2014-05-26 DIAGNOSIS — IMO0002 Reserved for concepts with insufficient information to code with codable children: Secondary | ICD-10-CM

## 2014-05-26 NOTE — Progress Notes (Signed)
Subjective:    Patient ID: Jason [name], male    DOB: 01/12/1986, 28 y.o.   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  He eats 3 meals per day, and snacks.  He takes lantus and PRN novolog (averages 25 units per day).    Past Medical History  Diagnosis Date  . GERD (gastroesophageal reflux disease)   . Asthma   . Hx MRSA infection     on face  . Gastroparesis   . Diabetic neuropathy   . Seizures   . Stroke     spinal stroke in 4/15  . Diabetes mellitus     sees Dr. Loanne Drilling     Past Surgical History  Procedure Laterality Date  . Tonsillectomy      History   Social History  . Marital Status: Single    Spouse Name: N/A    Number of Children: 0  . Years of Education: 11   Occupational History  . Disability    Social History Main Topics  . Smoking status: Former Smoker    Types: Cigars    Quit date: 01/29/2014  . Smokeless tobacco: Never Used     Comment: e-cigarette  . Alcohol Use: No     Comment: rarely  . Drug Use: No  . Sexual Activity: Not on file   Other Topics Concern  . Not on file   Social History Narrative   Patient lives at home with mother father.    Patient has 2 children.    Patient is single.    Patient was left hand but after stroke patient is now right handed.    Patient has  11th grade education.     Current Outpatient Prescriptions on File Prior to Visit  Medication Sig Dispense Refill  . albuterol (PROVENTIL) (2.5 MG/3ML) 0.083% nebulizer solution Take 3 mLs (2.5 mg total) by nebulization  every 4 (four) hours as needed for wheezing or shortness of breath.  75 mL  3  . benzoyl peroxide 10 % gel Apply topically daily.      Marland Kitchen dexlansoprazole (DEXILANT) 60 MG capsule Take 60 mg by mouth at bedtime.      . diclofenac sodium (VOLTAREN) 1 % GEL Apply 2 g topically daily as needed (for pain).      Marland Kitchen diphenoxylate-atropine (LOMOTIL) 2.5-0.025 MG per tablet Take 1 tab twice daily for diarrhea.  60 tablet  0  . fluconazole (DIFLUCAN) 200 MG tablet Take 1 tablet (200 mg total) by mouth daily.  7 tablet  0  . glucagon (GLUCAGON EMERGENCY) 1 MG injection Inject 1 mg into the vein once as needed.  1 each  12  . insulin aspart (NOVOLOG) 100 UNIT/ML injection Inject 1-17 Units into the skin 4 (four) times daily. Per sliding scale  10 mL  0  . insulin glargine (LANTUS) 100 UNIT/ML injection Inject 0.3 mLs (30 Units total) into the skin at bedtime.  10 mL  0  . levofloxacin (LEVAQUIN) 500 MG tablet Take 1 tablet (500  mg total) by mouth daily.  10 tablet  0  . Lido-Capsaicin-Men-Methyl Sal (MEDI-PATCH-LIDOCAINE) 0.5-0.035-5-20 % PTCH Apply topically.      . lidocaine (LIDODERM) 5 % Place 1 patch onto the skin daily. Remove & Discard patch within 12 hours or as directed by MD  30 patch  3  . lidocaine (XYLOCAINE) 2 % jelly Apply topically 4 (four) times daily as needed. To abrasion prn pain.  30 mL  11  . methocarbamol (ROBAXIN) 750 MG tablet Take 1 tablet (750 mg total) by mouth 4 (four) times daily as needed for muscle spasms.  120 tablet  1  . methocarbamol (ROBAXIN) 750 MG tablet Take 1 tablet (750 mg total) by mouth 3 (three) times daily.  21 tablet  0  . metoCLOPramide (REGLAN) 5 MG tablet Take 5 mg by mouth once a week.       . metroNIDAZOLE (FLAGYL) 250 MG tablet Take 1 tablet (250 mg total) by mouth 3 (three) times daily.  30 tablet  0  . midodrine (PROAMATINE) 5 MG tablet Take 5 mg by mouth daily. To help maintain blood pressure ( may take up to twice a day )      . morphine (MS CONTIN) 60 MG  12 hr tablet Take 1 tablet (60 mg total) by mouth every 12 (twelve) hours.  60 tablet  0  . Omeprazole-Sodium Bicarbonate (ZEGERID OTC) 20-1100 MG CAPS Take 1 capsule by mouth at bedtime as needed (for heartburn when out of Dexilant).       . ondansetron (ZOFRAN) 8 MG tablet Take 1 tablet (8 mg total) by mouth every 12 (twelve) hours as needed for nausea.  60 tablet  5  . OVER THE COUNTER MEDICATION Take 1 tablet by mouth daily. Allergy medication Claritin      . oxyCODONE (ROXICODONE) 15 MG immediate release tablet Take 15 mg by mouth every 4 (four) hours as needed for pain.      Marland Kitchen oxyCODONE-acetaminophen (PERCOCET) 10-325 MG per tablet Take 1 tablet by mouth every 8 (eight) hours as needed for pain.  12 tablet  0  . pregabalin (LYRICA) 100 MG capsule Take 1 capsule three times a day and take 2 capsules at bedtime  150 capsule  5  . silver sulfADIAZINE (SILVADENE) 1 % cream Apply 1 application topically 2 (two) times a week. Tuesday and thursday       No current facility-administered medications on file prior to visit.    Allergies  Allergen Reactions  . Cefuroxime Axetil Anaphylaxis  . Penicillins Anaphylaxis    ?can take amoxicillin?  [name] [Benzonatate] Anaphylaxis  . Shellfish Allergy Itching    Family History  Problem Relation Age of Onset  . Diabetes Father   . Hypertension Father   . Asthma      fhx  . Hypertension      fhx  . Stroke      fhx  . Heart disease Mother     BP 110/60  Pulse 96  Temp(Src) 98.3 F (36.8 C) (Oral)  SpO2 98%    Review of Systems denies weight loss, blurry vision, headache, chest pain, sob, n/v, urinary frequency (has indwelling urinary catheter), excessive diaphoresis, rhinorrhea, and easy bruising.  He has muscle cramps, cold intolerance, and intermittent depression.      Objective:   Physical Exam VS: see vs page GEN: no distress.  In power chair.  Has seat belt on. HEAD: head: no deformity eyes: no periorbital swelling, no  proptosis external nose and ears are normal mouth: no lesion seen NECK: supple, thyroid is not enlarged CHEST WALL: no deformity LUNGS: clear to auscultation BREASTS:  No gynecomastia CV: reg rate and rhythm, no murmur ABD: abdomen is soft, nontender.  no hepatosplenomegaly.  not distended.  no hernia MUSCULOSKELETAL: muscle bulk and strength are grossly normal.  no obvious joint swelling.  gait is normal and steady EXTEMITIES: no deformity.  no ulcer on the feet.  feet are of normal color and temp.  no edema.  There is bilateral onychomycosis.  Multiple healed burns, and dry skin PULSES: dorsalis pedis intact bilat.  no carotid bruit NEURO:  cn 2-12 grossly intact.   readily moves all 4's.  He has some sensation to touch on the feet, but decreased from normal.  SKIN:  Normal texture and temperature.  No rash or suspicious lesion is visible.   NODES:  None palpable at the neck PSYCH: alert, well-oriented.  Drowsy, but he does not appear anxious nor depressed.    Lab Results  Component Value Date   HGBA1C 8.5* 03/19/2014   i reviewed electrocardiogram (02/01/14)  i have reviewed the following outside records: Office and ER notes    Assessment & Plan:  DM: moderate exacerbation. Spinal cord infarction, new to me: this vastly complicates the rx of DM: This impairs the ability to achieve glycemic control.  I'll work around this as best I can. Autonomic neuropathy, including gastroparesis, well-controlled.  Pt is advised to continue same.   Patient is advised the following: Patient Instructions  good diet and exercise (physical therapy) significanly improve the control of your diabetes.  please let me know if you wish to be referred to a dietician.  high blood sugar is very risky to your health.  you should see an eye doctor and dentist every year.  You are at higher than average risk for pneumonia and hepatitis-B.  You should be vaccinated against both.   controlling your blood  pressure and cholesterol drastically reduces the damage diabetes does to your body.  this also applies to quitting smoking.  please discuss these with your doctor.  check your blood sugar 4 times a day: before the 3 meals, and at bedtime.  also check if you have symptoms of your blood sugar being too high or too low.  please keep a record of the readings and bring it to your next appointment here.  You can write it on any piece of paper.  please call us sooner if your blood sugar goes below 70, or if you have a lot of readings over 200. For now, please reduce the lantus to 25 units at bedtime, and: Take novolog 10 units 3 times a day (just before each meal) Please call next week to tell us how the blood sugar is doing. Please come back for a follow-up appointment in 1 month.

## 2014-05-26 NOTE — Patient Instructions (Signed)
good diet and exercise (physical therapy) significanly improve the control of your diabetes.  please let me know if you wish to be referred to a dietician.  high blood sugar is very risky to your health.  you should see an eye doctor and dentist every year.  You are at higher than average risk for pneumonia and hepatitis-B.  You should be vaccinated against both.   controlling your blood pressure and cholesterol drastically reduces the damage diabetes does to your body.  this also applies to quitting smoking.  please discuss these with your doctor.  check your blood sugar 4 times a day: before the 3 meals, and at bedtime.  also check if you have symptoms of your blood sugar being too high or too low.  please keep a record of the readings and bring it to your next appointment here.  You can write it on any piece of paper.  please call us sooner if your blood sugar goes below 70, or if you have a lot of readings over 200. For now, please reduce the lantus to 25 units at bedtime, and: Take novolog 10 units 3 times a day (just before each meal) Please call next week to tell us how the blood sugar is doing. Please come back for a follow-up appointment in 1 month.

## 2014-05-26 NOTE — Telephone Encounter (Signed)
I spoke with Jason [name] and we did not get the fax, still waiting. She will call their office again.

## 2014-05-26 NOTE — Telephone Encounter (Signed)
[employer] would like to know if you receive the authorization for pt to see the dentist??

## 2014-05-27 ENCOUNTER — Telehealth: Payer: Self-pay | Admitting: Endocrinology

## 2014-05-27 NOTE — Telephone Encounter (Signed)
Patient  Had two very low (25) b/s with in 24 hs had to go to the ER, mother thinks that the change of insulin cause his b/s to drop.

## 2014-05-28 ENCOUNTER — Emergency Department (HOSPITAL_BASED_OUTPATIENT_CLINIC_OR_DEPARTMENT_OTHER): Payer: Medicaid Other

## 2014-05-28 ENCOUNTER — Inpatient Hospital Stay (HOSPITAL_COMMUNITY): Payer: Medicaid Other

## 2014-05-28 ENCOUNTER — Ambulatory Visit: Payer: Medicaid Other | Admitting: Physical Therapy

## 2014-05-28 ENCOUNTER — Inpatient Hospital Stay (HOSPITAL_BASED_OUTPATIENT_CLINIC_OR_DEPARTMENT_OTHER)
Admission: EM | Admit: 2014-05-28 | Discharge: 2014-05-28 | DRG: 871 | Disposition: A | Discharge status: Home / Self Care | Payer: Medicaid Other | Attending: Internal Medicine | Admitting: Internal Medicine

## 2014-05-28 ENCOUNTER — Encounter (HOSPITAL_BASED_OUTPATIENT_CLINIC_OR_DEPARTMENT_OTHER): Payer: Self-pay | Admitting: Emergency Medicine

## 2014-05-28 DIAGNOSIS — J069 Acute upper respiratory infection, unspecified: Secondary | ICD-10-CM

## 2014-05-28 DIAGNOSIS — G8250 Quadriplegia, unspecified: Secondary | ICD-10-CM

## 2014-05-28 DIAGNOSIS — I69965 Other paralytic syndrome following unspecified cerebrovascular disease, bilateral: Secondary | ICD-10-CM | POA: Diagnosis not present

## 2014-05-28 DIAGNOSIS — S81809A Unspecified open wound, unspecified lower leg, initial encounter: Secondary | ICD-10-CM

## 2014-05-28 DIAGNOSIS — E1049 Type 1 diabetes mellitus with other diabetic neurological complication: Secondary | ICD-10-CM | POA: Diagnosis present

## 2014-05-28 DIAGNOSIS — J45909 Unspecified asthma, uncomplicated: Secondary | ICD-10-CM

## 2014-05-28 DIAGNOSIS — Z8614 Personal history of Methicillin resistant Staphylococcus aureus infection: Secondary | ICD-10-CM

## 2014-05-28 DIAGNOSIS — IMO0002 Reserved for concepts with insufficient information to code with codable children: Secondary | ICD-10-CM

## 2014-05-28 DIAGNOSIS — E1142 Type 2 diabetes mellitus with diabetic polyneuropathy: Secondary | ICD-10-CM | POA: Diagnosis present

## 2014-05-28 DIAGNOSIS — R6510 Systemic inflammatory response syndrome (SIRS) of non-infectious origin without acute organ dysfunction: Secondary | ICD-10-CM

## 2014-05-28 DIAGNOSIS — S91009A Unspecified open wound, unspecified ankle, initial encounter: Secondary | ICD-10-CM

## 2014-05-28 DIAGNOSIS — K3184 Gastroparesis: Secondary | ICD-10-CM | POA: Diagnosis present

## 2014-05-28 DIAGNOSIS — E1069 Type 1 diabetes mellitus with other specified complication: Secondary | ICD-10-CM

## 2014-05-28 DIAGNOSIS — D72829 Elevated white blood cell count, unspecified: Secondary | ICD-10-CM

## 2014-05-28 DIAGNOSIS — G9519 Other vascular myelopathies: Secondary | ICD-10-CM

## 2014-05-28 DIAGNOSIS — K219 Gastro-esophageal reflux disease without esophagitis: Secondary | ICD-10-CM | POA: Diagnosis present

## 2014-05-28 DIAGNOSIS — N319 Neuromuscular dysfunction of bladder, unspecified: Secondary | ICD-10-CM

## 2014-05-28 DIAGNOSIS — E43 Unspecified severe protein-calorie malnutrition: Secondary | ICD-10-CM

## 2014-05-28 DIAGNOSIS — Z794 Long term (current) use of insulin: Secondary | ICD-10-CM | POA: Diagnosis not present

## 2014-05-28 DIAGNOSIS — X58XXXA Exposure to other specified factors, initial encounter: Secondary | ICD-10-CM | POA: Diagnosis present

## 2014-05-28 DIAGNOSIS — S81009A Unspecified open wound, unspecified knee, initial encounter: Secondary | ICD-10-CM | POA: Diagnosis present

## 2014-05-28 DIAGNOSIS — Z87891 Personal history of nicotine dependence: Secondary | ICD-10-CM | POA: Diagnosis not present

## 2014-05-28 DIAGNOSIS — G9511 Acute infarction of spinal cord (embolic) (nonembolic): Secondary | ICD-10-CM

## 2014-05-28 DIAGNOSIS — IMO0001 Reserved for inherently not codable concepts without codable children: Secondary | ICD-10-CM

## 2014-05-28 DIAGNOSIS — E1149 Type 2 diabetes mellitus with other diabetic neurological complication: Secondary | ICD-10-CM

## 2014-05-28 DIAGNOSIS — E162 Hypoglycemia, unspecified: Secondary | ICD-10-CM

## 2014-05-28 DIAGNOSIS — E1040 Type 1 diabetes mellitus with diabetic neuropathy, unspecified: Secondary | ICD-10-CM

## 2014-05-28 DIAGNOSIS — E119 Type 2 diabetes mellitus without complications: Secondary | ICD-10-CM

## 2014-05-28 DIAGNOSIS — E1065 Type 1 diabetes mellitus with hyperglycemia: Secondary | ICD-10-CM | POA: Diagnosis present

## 2014-05-28 DIAGNOSIS — J189 Pneumonia, unspecified organism: Secondary | ICD-10-CM

## 2014-05-28 DIAGNOSIS — R197 Diarrhea, unspecified: Secondary | ICD-10-CM

## 2014-05-28 DIAGNOSIS — R509 Fever, unspecified: Secondary | ICD-10-CM

## 2014-05-28 DIAGNOSIS — E1143 Type 2 diabetes mellitus with diabetic autonomic (poly)neuropathy: Secondary | ICD-10-CM

## 2014-05-28 DIAGNOSIS — A419 Sepsis, unspecified organism: Principal | ICD-10-CM | POA: Diagnosis present

## 2014-05-28 DIAGNOSIS — J9601 Acute respiratory failure with hypoxia: Secondary | ICD-10-CM

## 2014-05-28 LAB — URINALYSIS, ROUTINE W REFLEX MICROSCOPIC
BILIRUBIN URINE: NEGATIVE
Glucose, UA: 500 mg/dL — AB
KETONES UR: NEGATIVE mg/dL
NITRITE: NEGATIVE
PROTEIN: NEGATIVE mg/dL
Specific Gravity, Urine: 1.017 (ref 1.005–1.030)
UROBILINOGEN UA: 0.2 mg/dL (ref 0.0–1.0)
pH: 6.5 (ref 5.0–8.0)

## 2014-05-28 LAB — CBC WITH DIFFERENTIAL/PLATELET
BASOS ABS: 0.1 10*3/uL (ref 0.0–0.1)
Basophils Absolute: 0.1 10*3/uL (ref 0.0–0.1)
Basophils Relative: 0 % (ref 0–1)
Basophils Relative: 0 % (ref 0–1)
Eosinophils Absolute: 0.7 10*3/uL (ref 0.0–0.7)
Eosinophils Absolute: 0.8 10*3/uL — ABNORMAL HIGH (ref 0.0–0.7)
Eosinophils Relative: 3 % (ref 0–5)
Eosinophils Relative: 4 % (ref 0–5)
HCT: 37 % — ABNORMAL LOW (ref 39.0–52.0)
HEMATOCRIT: 30.3 % — AB (ref 39.0–52.0)
Hemoglobin: 11.8 g/dL — ABNORMAL LOW (ref 13.0–17.0)
Hemoglobin: 9.6 g/dL — ABNORMAL LOW (ref 13.0–17.0)
LYMPHS ABS: 2.7 10*3/uL (ref 0.7–4.0)
LYMPHS PCT: 15 % (ref 12–46)
Lymphocytes Relative: 11 % — ABNORMAL LOW (ref 12–46)
Lymphs Abs: 3.1 10*3/uL (ref 0.7–4.0)
MCH: 24.7 pg — ABNORMAL LOW (ref 26.0–34.0)
MCH: 25.1 pg — ABNORMAL LOW (ref 26.0–34.0)
MCHC: 31.9 g/dL (ref 30.0–36.0)
MCHC: 31.7 g/dL (ref 30.0–36.0)
MCV: 77.6 fL — AB (ref 78.0–100.0)
MCV: 79.1 fL (ref 78.0–100.0)
MONO ABS: 1.9 10*3/uL — AB (ref 0.1–1.0)
MONOS PCT: 9 % (ref 3–12)
Monocytes Absolute: 2.2 10*3/uL — ABNORMAL HIGH (ref 0.1–1.0)
Monocytes Relative: 10 % (ref 3–12)
NEUTROS PCT: 77 % (ref 43–77)
Neutro Abs: 18.8 10*3/uL — ABNORMAL HIGH (ref 1.7–7.7)
Neutro Abs: 14.3 10*3/uL — ABNORMAL HIGH (ref 1.7–7.7)
Neutrophils Relative %: 71 % (ref 43–77)
Platelets: 320 10*3/uL (ref 150–400)
Platelets: 260 10*3/uL (ref 150–400)
RBC: 3.83 MIL/uL — ABNORMAL LOW (ref 4.22–5.81)
RBC: 4.77 MIL/uL (ref 4.22–5.81)
RDW: 16.7 % — ABNORMAL HIGH (ref 11.5–15.5)
RDW: 16.3 % — AB (ref 11.5–15.5)
WBC: 24.5 10*3/uL — ABNORMAL HIGH (ref 4.0–10.5)
WBC: 20.1 10*3/uL — AB (ref 4.0–10.5)

## 2014-05-28 LAB — COMPREHENSIVE METABOLIC PANEL
ALBUMIN: 2.8 g/dL — AB (ref 3.5–5.2)
ALK PHOS: 154 U/L — AB (ref 39–117)
ALT: 46 U/L (ref 0–53)
ALT: 49 U/L (ref 0–53)
ANION GAP: 10 (ref 5–15)
AST: 54 U/L — ABNORMAL HIGH (ref 0–37)
AST: 71 U/L — ABNORMAL HIGH (ref 0–37)
Albumin: 3.7 g/dL (ref 3.5–5.2)
Alkaline Phosphatase: 131 U/L — ABNORMAL HIGH (ref 39–117)
Anion gap: 13 (ref 5–15)
BILIRUBIN TOTAL: 0.3 mg/dL (ref 0.3–1.2)
BILIRUBIN TOTAL: 0.3 mg/dL (ref 0.3–1.2)
BUN: 20 mg/dL (ref 6–23)
BUN: 16 mg/dL (ref 6–23)
CHLORIDE: 103 meq/L (ref 96–112)
CO2: 24 mEq/L (ref 19–32)
CO2: 28 meq/L (ref 19–32)
Calcium: 11.8 mg/dL — ABNORMAL HIGH (ref 8.4–10.5)
Calcium: 9.9 mg/dL (ref 8.4–10.5)
Chloride: 99 mEq/L (ref 96–112)
Creatinine, Ser: 0.8 mg/dL (ref 0.50–1.35)
Creatinine, Ser: 0.73 mg/dL (ref 0.50–1.35)
GFR calc Af Amer: >90 mL/min (ref >90)
GFR calc Af Amer: >90 mL/min (ref >90)
GFR calc non Af Amer: >90 mL/min (ref >90)
GLUCOSE: 43 mg/dL — AB (ref 70–99)
Glucose, Bld: 165 mg/dL — ABNORMAL HIGH (ref 70–99)
POTASSIUM: 4.6 meq/L (ref 3.7–5.3)
Potassium: 4.1 mEq/L (ref 3.7–5.3)
SODIUM: 140 meq/L (ref 137–147)
Sodium: 137 mEq/L (ref 137–147)
TOTAL PROTEIN: 6.7 g/dL (ref 6.0–8.3)
Total Protein: 8.9 g/dL — ABNORMAL HIGH (ref 6.0–8.3)

## 2014-05-28 LAB — HEMOGLOBIN A1C
Hgb A1c MFr Bld: 9 % — ABNORMAL HIGH (ref <5.7)
MEAN PLASMA GLUCOSE: 212 mg/dL — AB (ref <117)

## 2014-05-28 LAB — CBG MONITORING, ED
GLUCOSE-CAPILLARY: 163 mg/dL — AB (ref 70–99)
GLUCOSE-CAPILLARY: 200 mg/dL — AB (ref 70–99)
Glucose-Capillary: 174 mg/dL — ABNORMAL HIGH (ref 70–99)
Glucose-Capillary: 44 mg/dL — CL (ref 70–99)

## 2014-05-28 LAB — GLUCOSE, CAPILLARY
Glucose-Capillary: 272 mg/dL — ABNORMAL HIGH (ref 70–99)
Glucose-Capillary: 212 mg/dL — ABNORMAL HIGH (ref 70–99)
Glucose-Capillary: 118 mg/dL — ABNORMAL HIGH (ref 70–99)

## 2014-05-28 LAB — MRSA PCR SCREENING: MRSA by PCR: NEGATIVE

## 2014-05-28 LAB — SEDIMENTATION RATE: Sed Rate: 29 mm/hr — ABNORMAL HIGH (ref 0–16)

## 2014-05-28 LAB — TSH: TSH: 6.07 u[IU]/mL — ABNORMAL HIGH (ref 0.350–4.500)

## 2014-05-28 LAB — URINE MICROSCOPIC-ADD ON

## 2014-05-28 LAB — I-STAT CG4 LACTIC ACID, ED: LACTIC ACID, VENOUS: 1.49 mmol/L (ref 0.5–2.2)

## 2014-05-28 MED ORDER — GLUCOSE 40 % PO GEL
ORAL | Status: AC
  Filled 2014-05-28: qty 0.83

## 2014-05-28 MED ORDER — DEXTROSE 5 % IV SOLN
1.0000 g | Freq: Once | INTRAVENOUS | Status: AC
  Administered 2014-05-28: 1 g via INTRAVENOUS
  Filled 2014-05-28: qty 1

## 2014-05-28 MED ORDER — PANTOPRAZOLE SODIUM 40 MG PO TBEC
40.0000 mg | DELAYED_RELEASE_TABLET | Freq: Every day | ORAL | Status: DC
  Administered 2014-05-28: 40 mg via ORAL
  Filled 2014-05-28: qty 1

## 2014-05-28 MED ORDER — SODIUM CHLORIDE 0.9 % IV SOLN: INTRAVENOUS | Status: DC

## 2014-05-28 MED ORDER — ONDANSETRON HCL 4 MG PO TABS: 4.0000 mg | ORAL_TABLET | Freq: Four times a day (QID) | ORAL | Status: DC | PRN

## 2014-05-28 MED ORDER — DEXTROSE 50 % IV SOLN
INTRAVENOUS | Status: AC
  Administered 2014-05-28: 50 mL via INTRAVENOUS
  Filled 2014-05-28: qty 50

## 2014-05-28 MED ORDER — ACETAMINOPHEN 325 MG PO TABS
650.0000 mg | ORAL_TABLET | Freq: Once | ORAL | Status: AC
  Administered 2014-05-28: 650 mg via ORAL

## 2014-05-28 MED ORDER — SILVER SULFADIAZINE 1 % EX CREA
1.0000 "application " | TOPICAL_CREAM | CUTANEOUS | Status: DC
  Filled 2014-05-28: qty 85

## 2014-05-28 MED ORDER — INSULIN ASPART 100 UNIT/ML ~~LOC~~ SOLN
0.0000 [IU] | Freq: Three times a day (TID) | SUBCUTANEOUS | Status: DC
  Administered 2014-05-28: 3 [IU] via SUBCUTANEOUS

## 2014-05-28 MED ORDER — ACETAMINOPHEN 325 MG PO TABS
ORAL_TABLET | ORAL | Status: AC
  Filled 2014-05-28: qty 2

## 2014-05-28 MED ORDER — METOCLOPRAMIDE HCL 5 MG PO TABS: 5.0000 mg | ORAL_TABLET | ORAL | Status: DC

## 2014-05-28 MED ORDER — INSULIN GLARGINE 100 UNIT/ML ~~LOC~~ SOLN
30.0000 [IU] | Freq: Every day | SUBCUTANEOUS | Status: DC
  Filled 2014-05-28: qty 0.3

## 2014-05-28 MED ORDER — METHOCARBAMOL 750 MG PO TABS
750.0000 mg | ORAL_TABLET | Freq: Three times a day (TID) | ORAL | Status: DC | PRN
  Filled 2014-05-28: qty 1

## 2014-05-28 MED ORDER — FLUCONAZOLE 200 MG PO TABS: 200.0000 mg | ORAL_TABLET | Freq: Every day | ORAL | Status: DC

## 2014-05-28 MED ORDER — VANCOMYCIN HCL IN DEXTROSE 1-5 GM/200ML-% IV SOLN
1000.0000 mg | Freq: Once | INTRAVENOUS | Status: AC
  Administered 2014-05-28: 1000 mg via INTRAVENOUS
  Filled 2014-05-28: qty 200

## 2014-05-28 MED ORDER — MORPHINE SULFATE ER 60 MG PO TBCR
60.0000 mg | EXTENDED_RELEASE_TABLET | Freq: Two times a day (BID) | ORAL | Status: DC
  Administered 2014-05-28: 60 mg via ORAL
  Filled 2014-05-28: qty 1

## 2014-05-28 MED ORDER — MIDODRINE HCL 5 MG PO TABS
5.0000 mg | ORAL_TABLET | Freq: Every day | ORAL | Status: DC
  Administered 2014-05-28: 5 mg via ORAL
  Filled 2014-05-28: qty 1

## 2014-05-28 MED ORDER — METHOCARBAMOL 750 MG PO TABS
750.0000 mg | ORAL_TABLET | Freq: Three times a day (TID) | ORAL | Status: DC
  Administered 2014-05-28: 750 mg via ORAL
  Filled 2014-05-28 (×3): qty 1

## 2014-05-28 MED ORDER — DICLOFENAC SODIUM 1 % TD GEL: 2.0000 g | Freq: Every day | TRANSDERMAL | Status: DC | PRN

## 2014-05-28 MED ORDER — ENOXAPARIN SODIUM 40 MG/0.4ML ~~LOC~~ SOLN
40.0000 mg | SUBCUTANEOUS | Status: DC
Start: 2014-05-28 — End: 2014-05-28
  Administered 2014-05-28: 40 mg via SUBCUTANEOUS
  Filled 2014-05-28: qty 0.4

## 2014-05-28 MED ORDER — MORPHINE SULFATE 2 MG/ML IJ SOLN
2.0000 mg | Freq: Once | INTRAMUSCULAR | Status: AC
  Administered 2014-05-28: 2 mg via INTRAVENOUS
  Filled 2014-05-28: qty 1

## 2014-05-28 MED ORDER — INSULIN GLARGINE 100 UNIT/ML ~~LOC~~ SOLN
10.0000 [IU] | Freq: Once | SUBCUTANEOUS | Status: AC
  Administered 2014-05-28: 10 [IU] via SUBCUTANEOUS
  Filled 2014-05-28: qty 0.1

## 2014-05-28 MED ORDER — ACETAMINOPHEN 325 MG PO TABS: 650.0000 mg | ORAL_TABLET | Freq: Four times a day (QID) | ORAL | Status: DC | PRN

## 2014-05-28 MED ORDER — OXYCODONE HCL 5 MG PO TABS
15.0000 mg | ORAL_TABLET | ORAL | Status: DC | PRN
  Administered 2014-05-28: 15 mg via ORAL
  Filled 2014-05-28: qty 3

## 2014-05-28 MED ORDER — LEVOFLOXACIN 500 MG PO TABS: 500.0000 mg | ORAL_TABLET | Freq: Every day | ORAL | Status: DC

## 2014-05-28 MED ORDER — SODIUM CHLORIDE 0.9 % IV SOLN
INTRAVENOUS | Status: DC
  Administered 2014-05-28: 05:00:00 via INTRAVENOUS

## 2014-05-28 MED ORDER — VANCOMYCIN HCL IN DEXTROSE 750-5 MG/150ML-% IV SOLN
750.0000 mg | Freq: Two times a day (BID) | INTRAVENOUS | Status: DC
  Filled 2014-05-28: qty 150

## 2014-05-28 MED ORDER — SODIUM CHLORIDE 0.9 % IV BOLUS (SEPSIS)
1000.0000 mL | Freq: Once | INTRAVENOUS | Status: AC
  Administered 2014-05-28: 1000 mL via INTRAVENOUS

## 2014-05-28 MED ORDER — DEXTROSE 5 % IV SOLN
1.0000 g | Freq: Three times a day (TID) | INTRAVENOUS | Status: DC
  Filled 2014-05-28 (×2): qty 1

## 2014-05-28 MED ORDER — ACETAMINOPHEN 650 MG RE SUPP: 650.0000 mg | Freq: Four times a day (QID) | RECTAL | Status: DC | PRN

## 2014-05-28 MED ORDER — DEXTROSE 50 % IV SOLN
1.0000 | Freq: Once | INTRAVENOUS | Status: AC
  Administered 2014-05-28: 50 mL via INTRAVENOUS

## 2014-05-28 MED ORDER — PREGABALIN 50 MG PO CAPS
100.0000 mg | ORAL_CAPSULE | Freq: Three times a day (TID) | ORAL | Status: DC
  Administered 2014-05-28: 100 mg via ORAL
  Filled 2014-05-28: qty 2

## 2014-05-28 MED ORDER — ONDANSETRON HCL 4 MG/2ML IJ SOLN: 4.0000 mg | Freq: Four times a day (QID) | INTRAMUSCULAR | Status: DC | PRN

## 2014-05-28 MED ORDER — LIDOCAINE HCL 2 % EX GEL: Freq: Four times a day (QID) | CUTANEOUS | Status: DC | PRN

## 2014-05-28 MED ORDER — ALBUTEROL SULFATE (2.5 MG/3ML) 0.083% IN NEBU: 2.5000 mg | INHALATION_SOLUTION | RESPIRATORY_TRACT | Status: DC | PRN

## 2014-05-28 MED ORDER — INSULIN ASPART 100 UNIT/ML ~~LOC~~ SOLN: SUBCUTANEOUS | Status: DC

## 2014-05-28 NOTE — Clinical Social Work Note (Signed)
Clinical Social Worker received notification from RN that patient will need ambulance transportation home.  CSW confirmed address and arranged ambulance transport home via [facility].    Barbette Or, [place]

## 2014-05-28 NOTE — ED Notes (Signed)
MD at bedside. 

## 2014-05-28 NOTE — Consult Note (Addendum)
WOC wound consult note Reason for Consult: Consult requested for left leg wound.  Pt states he has home health assistance for chronic wound and dressing is intact to left calf. Wound type: When dressing is removed, there is no open wound noted.  Pink dry scar tissue intact in 2 locations without open area or drainage. Loss of pigment is the result of a healed previous full thickness wound. Topical treatment is no longer indicated and site can be left open to air.  Please re-consult if further assistance is needed.  Thank-you,  Julien Girt MSN, [place], [place], [place], [place]

## 2014-05-28 NOTE — Discharge Summary (Signed)
Physician Discharge Summary  Jason [name]:[id number] DOB: 28-Feb-1986 DOA: 05/28/2014  PCP: [name], MD  Admit date: 05/28/2014 Discharge date: 05/28/2014  Discharge Diagnoses:  Principal Problem:   SIRS (systemic inflammatory response syndrome) Active Problems:   Type 1 diabetes, uncontrolled, with neuropathy   Gastroparesis due to DM   Spinal cord infarction (history of)   Hypoglycemia   Discharge Condition: stable  Filed Weights   05/28/14 0047 05/28/14 0450  Weight: 65.772 kg (145 lb) 63 kg (138 lb 14.2 oz)    History of present illness:  28 y.o. male with history of diabetes mellitus1, spinal cord infarct with tetraplegia, gastroparesis, asthma notice that he was having persistent hypoglycemia at home and called EMS. When EMS arrived patient's blood sugar was around 50 and was given oral glucose and brought to the ER and in the ER patient's blood sugar was found to be in the 40s and was given D50. Patient's blood sugar since then has come up and is presently 250. In the ER patient was found to be febrile and tachycardic and her blood work showed leukocytosis. Chest x-ray and UA unremarkable and since patient has been admitted recently at least 3 times for respiratory failure with aspiration patient was empirically start antibiotics and admitted for further management. On my exam patient presently is not in acute distress. Patient states that a large meal yesterday morning and since then was feeling bloated and did not eat anything. When he was taking his blood sugars consistently low in the evenings. Patient was recently treated on Flagyl for diarrhea by his gastroenterologist and also was on Lomotil. Patient has been taking Flagyl for last 7 days and the last dose was yesterday. Patient states his diarrhea is completely resolved and has had a normal bowel movement yesterday. Patient denies any chest pain or shortness of breath or productive cough.   Hospital Course:   Patient was observed overnight. Had no further hypoglycemia or fever. Repeat CXR showed possible infiltrate v. Atelectasis. Patient felt well and was insistent upon leaving. He is hemodynamically stable, back to clinical baseline with normal vital signs. Due to fever on admission, abnormal CXR and leukocytosis, have discharged patient on a short course of levaquin.   Procedures none  Consultations:  none  Discharge Exam: Filed Vitals:   05/28/14 0450  BP: 113/71  Pulse: 105  Temp: 98 F (36.7 C)  Resp: 19    General: nontoxic. comfortable Cardiovascular: CTA without WRR Respiratory: RRR without MGR   Discharge Instructions   Diet Carb Modified    Complete by:  As directed      Increase activity slowly    Complete by:  As directed             Medication List         acetaminophen 325 MG tablet  Commonly known as:  TYLENOL  Take 2 tablets (650 mg total) by mouth every 6 (six) hours as needed for mild pain (or Fever >/= 101).     albuterol (2.5 MG/3ML) 0.083% nebulizer solution  Commonly known as:  PROVENTIL  Take 3 mLs (2.5 mg total) by nebulization every 4 (four) hours as needed for wheezing or shortness of breath.     DEXILANT 60 MG capsule  Generic drug:  dexlansoprazole  Take 60 mg by mouth daily.     diclofenac sodium 1 % Gel  Commonly known as:  VOLTAREN  Apply 2 g topically daily as needed (for pain).  diphenoxylate-atropine 2.5-0.025 MG per tablet  Commonly known as:  LOMOTIL  Take 1 tablet by mouth 2 (two) times daily.     glucagon 1 MG injection  Commonly known as:  GLUCAGON EMERGENCY  Inject 1 mg into the vein once as needed.     insulin aspart 100 UNIT/ML injection  Commonly known as:  novoLOG  Per Dr. Loanne Drilling     insulin glargine 100 UNIT/ML injection  Commonly known as:  LANTUS  Inject 25 Units into the skin at bedtime.     levofloxacin 500 MG tablet  Commonly known as:  LEVAQUIN  Take 1 tablet (500 mg total) by mouth daily.      lidocaine 2 % jelly  Commonly known as:  XYLOCAINE  Apply 1 application topically 3 (three) times daily as needed (for pain).     lidocaine 5 %  Commonly known as:  LIDODERM  Place 1 patch onto the skin daily. Remove & Discard patch within 12 hours or as directed by MD     loratadine 10 MG tablet  Commonly known as:  CLARITIN  Take 10 mg by mouth 2 (two) times daily as needed for allergies.     methocarbamol 750 MG tablet  Commonly known as:  ROBAXIN  Take 1 tablet (750 mg total) by mouth 4 (four) times daily as needed for muscle spasms.     metoCLOPramide 5 MG tablet  Commonly known as:  REGLAN  Take 1 tablet (5 mg total) by mouth 2 (two) times a week. And as needed for nausea     midodrine 5 MG tablet  Commonly known as:  PROAMATINE  Take 5 mg by mouth 2 (two) times daily as needed (for blood pressure).     morphine 60 MG 12 hr tablet  Commonly known as:  MS CONTIN  Take 1 tablet (60 mg total) by mouth every 12 (twelve) hours.     ondansetron 8 MG tablet  Commonly known as:  ZOFRAN  Take 1 tablet (8 mg total) by mouth every 12 (twelve) hours as needed for nausea.     oxyCODONE 15 MG immediate release tablet  Commonly known as:  ROXICODONE  Take 15 mg by mouth every 6 (six) hours as needed for pain.     oxyCODONE-acetaminophen 10-325 MG per tablet  Commonly known as:  PERCOCET  Take 1 tablet by mouth every 6 (six) hours as needed for pain.     pregabalin 100 MG capsule  Commonly known as:  LYRICA  Take 100-200 mg by mouth 4 (four) times daily. Take 100 mg by mouth in the morning, take 100 mg by mouth in the afternoon, take 100 mg by mouth in the evening and take 200 mg by mouth at bedtime.     silver sulfADIAZINE 1 % cream  Commonly known as:  SILVADENE  Apply 1 application topically 2 (two) times a week. Tuesday and thursday       Allergies  Allergen Reactions  . Cefuroxime Axetil Anaphylaxis  . Penicillins Anaphylaxis    ?can take amoxicillin?  [name]  [Benzonatate] Anaphylaxis  . Shellfish Allergy Itching       Follow-up Information   Follow up with [name], MD. (If symptoms worsen)    Specialty:  Family Medicine   Contact information:   [address] [place] [id number] [phone]        The results of significant diagnostics from this hospitalization (including imaging, microbiology, ancillary and laboratory) are listed below  for reference.    Significant Diagnostic Studies: Ct Abdomen Pelvis Wo Contrast  05/20/2014   CLINICAL DATA:  Hydronephrosis, UTIs, status post Levaquin for pneumonia  EXAM: CT ABDOMEN AND PELVIS WITHOUT CONTRAST  TECHNIQUE: Multidetector CT imaging of the abdomen and pelvis was performed following the standard protocol without IV contrast.  COMPARISON:  09/22/2013  FINDINGS: Mild ground-glass nodularity at the lung bases, likely post infectious/inflammatory.  Layering fluid within a distal esophagus/hiatal hernia (series 3/image 1).  Unenhanced liver, spleen, pancreas, and adrenal glands within normal limits.  Gallbladder is unremarkable. No intrahepatic or extrahepatic ductal dilatation.  Right kidney is notable for a faint 3 mm interpolar calcification (coronal image 51) and a 2 mm nonobstructing right lower pole renal calculus (coronal image 54). Left kidney is notable for a faint 3 mm interpolar calcification (coronal image 43). No hydronephrosis.  No evidence of bowel obstruction.  Normal appendix.  No evidence of abdominal aortic aneurysm.  No abdominopelvic ascites.  No suspicious abdominopelvic lymphadenopathy.  Prostatomegaly with enlargement of the central gland, suggesting BPH.  Bladder is thick-walled with indwelling Foley catheter.  Visualized osseous structures are within normal limits.  IMPRESSION: Bilateral nonobstructing renal calculi measuring up to 3 mm. No ureteral or bladder calculi. No hydronephrosis.  Prostatomegaly with enlargement of the central gland, suggesting BPH.   Thick-walled bladder with indwelling Foley catheter.  Additional ancillary findings, as above   Electronically Signed   By: Julian Hy M.D.   On: 05/20/2014 15:16   Dg Chest Port 1 View  05/28/2014   CLINICAL DATA:  Diabetic patient with fever and tachycardia. Leukocytosis.  EXAM: PORTABLE CHEST - 1 VIEW  COMPARISON:  Single view of the chest 05/28/2014 and 05/03/2014. Chest in two views abdomen 06/30/2013.  FINDINGS: Single view of the chest demonstrates patchy left basilar airspace disease. The right lung is clear. No pneumothorax or pleural effusion. Heart size is normal.  IMPRESSION: Patchy left basilar airspace disease could be due to atelectasis or pneumonia.   Electronically Signed   By: Inge Rise M.D.   On: 05/28/2014 07:35   Dg Chest Port 1 View  05/28/2014   CLINICAL DATA:  Fever.  EXAM: PORTABLE CHEST - 1 VIEW  COMPARISON:  05/03/2014  FINDINGS: There is interstitial coarsening, especially at the bases and on the left. The appearance is similar to prior, no definitive pneumonia. No edema, effusion, or pneumothorax. Normal heart size and upper mediastinal contours. Markedly dilated stomach containing gas and fluid material. Remote lateral left clavicle fracture.  IMPRESSION: 1. No definitive pneumonia. If ongoing concern, suggest followup to re-evaluate the left base. 2. Chronically and markedly dilated stomach, likely diabetic gastroparesis.   Electronically Signed   By: Jorje Guild M.D.   On: 05/28/2014 01:31   Dg Abd Portable 1v  05/28/2014   CLINICAL DATA:  Abdominal discomfort.  EXAM: PORTABLE ABDOMEN - 1 VIEW  COMPARISON:  CT abdomen and pelvis 05/20/2014. Single view of the abdomen 03/16/2014.  FINDINGS: The bowel gas pattern is nonobstructive. No abnormal abdominal calcification is seen. No focal bony abnormality is identified.  IMPRESSION: Negative exam.   Electronically Signed   By: Inge Rise M.D.   On: 05/28/2014 07:32    Microbiology: Recent Results (from  the past 240 hour(s))  MRSA PCR SCREENING     Status: None   Collection Time    05/28/14  4:42 AM      Result Value Ref Range Status   MRSA by PCR NEGATIVE  NEGATIVE Final  Comment:            The GeneXpert MRSA Assay (FDA     approved for NASAL specimens     only), is one component of a     comprehensive MRSA colonization     surveillance program. It is not     intended to diagnose MRSA     infection nor to guide or     monitor treatment for     MRSA infections.     Labs: Basic Metabolic Panel:  Recent Labs Lab 05/25/14 1444 05/28/14 0050 05/28/14 0940  NA 139 140 137  K 4.4 4.6 4.1  CL 102 99 103  CO2 30 28 24   GLUCOSE 109* 43* 165*  BUN 17 20 16   CREATININE 0.8 0.80 0.73  CALCIUM 11.0* 11.8* 9.9   Liver Function Tests:  Recent Labs Lab 05/28/14 0050 05/28/14 0940  AST 54* 71*  ALT 49 46  ALKPHOS 154* 131*  BILITOT 0.3 0.3  PROT 8.9* 6.7  ALBUMIN 3.7 2.8*   No results found for this basename: LIPASE, AMYLASE,  in the last 168 hours No results found for this basename: AMMONIA,  in the last 168 hours CBC:  Recent Labs Lab 05/25/14 1444 05/28/14 0050 05/28/14 0940  WBC 8.4 24.5* 20.1*  NEUTROABS 4.1 18.8* 14.3*  HGB 10.9* 11.8* 9.6*  HCT 34.1* 37.0* 30.3*  MCV 77.6* 77.6* 79.1  PLT 357.0 320 260   Cardiac Enzymes: No results found for this basename: CKTOTAL, CKMB, CKMBINDEX, TROPONINI,  in the last 168 hours BNP: BNP (last 3 results)  Recent Labs  09/28/13 0430 10/02/13 0445 01/29/14 2113  PROBNP 673.0* 2451.0* 1343.0*   CBG:  Recent Labs Lab 05/28/14 0213 05/28/14 0242 05/28/14 0339 05/28/14 0451 05/28/14 0703  GLUCAP 163* 174* 200* 272* 212*       Signed:  Uyen Eichholz L  [facility] 05/28/2014, 11:07 AM

## 2014-05-28 NOTE — Telephone Encounter (Signed)
Received call a nurse report on below. Note forward to MD. Waiting on response.

## 2014-05-28 NOTE — H&P (Signed)
[facility] History and Physical  Jason [name] QPY:[id number] DOB: [birth year] DOA: 05/28/2014  Referring physician: ER physician at [facility]. PCP: [name], MD   Chief Complaint: Low blood sugar.  HPI: Jason [name] is a 28 y.o. male with history of diabetes mellitus1, spinal cord infarct with tetraplegia, gastroparesis, asthma notice that he was having persistent hypoglycemia at home and called EMS. When EMS arrived patient's blood sugar was around 50 and was given oral glucose and brought to the ER and in the ER patient's blood sugar was found to be in the 40s and was given D50. Patient's blood sugar since then has come up and is presently 250. In the ER patient was found to be febrile and tachycardic and her blood work showed leukocytosis. Chest x-ray and UA unremarkable and since patient has been admitted recently at least 3 times for respiratory failure with aspiration patient was empirically start antibiotics and admitted for further management. On my exam patient presently is not in acute distress. Patient states that a large meal yesterday morning and since then was feeling bloated and did not eat anything. When he was taking his blood sugars consistently low in the evenings. Patient was recently treated on Flagyl for diarrhea by his gastroenterologist and also was on Lomotil. Patient has been taking Flagyl for last 7 days and the last dose was yesterday. Patient states his diarrhea is completely resolved and has had a normal bowel movement yesterday. Patient denies any chest pain or shortness of breath or productive cough. Patient has lower extremity wounds which are not actively draining.  Review of Systems: As presented in the history of presenting illness, rest negative.  Past Medical History  Diagnosis Date  . GERD (gastroesophageal reflux disease)   . Asthma   . Hx MRSA infection     on face  . Gastroparesis   . Diabetic neuropathy   . Seizures   .  Stroke     spinal stroke in 4/15  . Diabetes mellitus     sees Dr. Loanne Drilling    Past Surgical History  Procedure Laterality Date  . Tonsillectomy     Social History:  reports that he quit smoking about 3 months ago. His smoking use included Cigars. He has never used smokeless tobacco. He reports that he does not drink alcohol or use illicit drugs. Where does patient live home. Can patient participate in ADLs? No.  Allergies  Allergen Reactions  . Cefuroxime Axetil Anaphylaxis  . Penicillins Anaphylaxis    ?can take amoxicillin?  [name] [Benzonatate] Anaphylaxis  . Shellfish Allergy Itching    Family History:  Family History  Problem Relation Age of Onset  . Diabetes Father   . Hypertension Father   . Asthma      fhx  . Hypertension      fhx  . Stroke      fhx  . Heart disease Mother       Prior to Admission medications   Medication Sig Start Date End Date Taking? Authorizing Provider  albuterol (PROVENTIL) (2.5 MG/3ML) 0.083% nebulizer solution Take 3 mLs (2.5 mg total) by nebulization every 4 (four) hours as needed for wheezing or shortness of breath. 04/27/14   [name], MD  benzoyl peroxide 10 % gel Apply topically daily.    Historical Provider, MD  dexlansoprazole (DEXILANT) 60 MG capsule Take 60 mg by mouth at bedtime.    Historical Provider, MD  diclofenac sodium (VOLTAREN) 1 % GEL Apply  2 g topically daily as needed (for pain).    Historical Provider, MD  diphenoxylate-atropine (LOMOTIL) 2.5-0.025 MG per tablet Take 1 tab twice daily for diarrhea. 05/13/14   Willia Craze, NP  fluconazole (DIFLUCAN) 200 MG tablet Take 1 tablet (200 mg total) by mouth daily. 04/03/14   April K Palumbo-Rasch, MD  glucagon (GLUCAGON EMERGENCY) 1 MG injection Inject 1 mg into the vein once as needed. 04/20/14   [name], MD  insulin aspart (NOVOLOG) 100 UNIT/ML injection Inject 1-17 Units into the skin 4 (four) times daily. Per sliding scale 03/21/14   Jonetta Osgood, MD   insulin glargine (LANTUS) 100 UNIT/ML injection Inject 0.3 mLs (30 Units total) into the skin at bedtime. 03/21/14   Shanker Kristeen Mans, MD  Lido-Capsaicin-Men-Methyl Sal (MEDI-PATCH-LIDOCAINE) 0.5-0.035-5-20 % PTCH Apply topically.    Historical Provider, MD  lidocaine (LIDODERM) 5 % Place 1 patch onto the skin daily. Remove & Discard patch within 12 hours or as directed by MD 05/20/14   Danella Sensing, NP  lidocaine (XYLOCAINE) 2 % jelly Apply topically 4 (four) times daily as needed. To abrasion prn pain. 05/25/14   [name], MD  methocarbamol (ROBAXIN) 750 MG tablet Take 1 tablet (750 mg total) by mouth 4 (four) times daily as needed for muscle spasms. 03/05/14   Bary Leriche, PA-C  methocarbamol (ROBAXIN) 750 MG tablet Take 1 tablet (750 mg total) by mouth 3 (three) times daily. 04/03/14   April K Palumbo-Rasch, MD  metoCLOPramide (REGLAN) 5 MG tablet Take 5 mg by mouth once a week.  03/21/14   Shanker Kristeen Mans, MD  metroNIDAZOLE (FLAGYL) 250 MG tablet Take 1 tablet (250 mg total) by mouth 3 (three) times daily. 05/13/14   Willia Craze, NP  midodrine (PROAMATINE) 5 MG tablet Take 5 mg by mouth daily. To help maintain blood pressure ( may take up to twice a day ) 03/21/14   Jonetta Osgood, MD  morphine (MS CONTIN) 60 MG 12 hr tablet Take 1 tablet (60 mg total) by mouth every 12 (twelve) hours. 04/19/14   Meredith Staggers, MD  Omeprazole-Sodium Bicarbonate (ZEGERID OTC) 20-1100 MG CAPS Take 1 capsule by mouth at bedtime as needed (for heartburn when out of Dexilant).     Historical Provider, MD  ondansetron (ZOFRAN) 8 MG tablet Take 1 tablet (8 mg total) by mouth every 12 (twelve) hours as needed for nausea. 04/20/14   [name], MD  OVER THE COUNTER MEDICATION Take 1 tablet by mouth daily. Allergy medication Claritin    Historical Provider, MD  oxyCODONE (ROXICODONE) 15 MG immediate release tablet Take 15 mg by mouth every 4 (four) hours as needed for pain.    Historical Provider, MD   oxyCODONE-acetaminophen (PERCOCET) 10-325 MG per tablet Take 1 tablet by mouth every 8 (eight) hours as needed for pain. 05/24/14   Danella Sensing, NP  pregabalin (LYRICA) 100 MG capsule Take 1 capsule three times a day and take 2 capsules at bedtime 05/25/14   [name], MD  silver sulfADIAZINE (SILVADENE) 1 % cream Apply 1 application topically 2 (two) times a week. Tuesday and thursday    Historical Provider, MD    Physical Exam: Filed Vitals:   05/28/14 0220 05/28/14 0254 05/28/14 0343 05/28/14 0450  BP:  144/93 110/69 113/71  Pulse: 114 112 110 105  Temp: 99.8 F (37.7 C) 98.6 F (37 C) 99 F (37.2 C) 98 F (36.7 C)  TempSrc: Oral  Oral Oral Oral  Resp: 16 16 18 19   Height:      Weight:    63 kg (138 lb 14.2 oz)  SpO2: 95% 95% 92% 96%     General:  Poorly built and nourished.  Eyes: Anicteric no pallor.  ENT: No discharge from the ears eyes nose mouth.  Neck: No mass felt.  Cardiovascular: S1-S2 heard.  Respiratory: No rhonchi or crepitations.  Abdomen: Soft nontender bowel sounds febrile. No guarding rigidity.  Skin: Left shin ulcer which is dressed.  Musculoskeletal: No edema.  Psychiatric: Appears normal.  Neurologic: Alert awake oriented to time place and person. Patient has tetraplegia.  Labs on Admission:  Basic Metabolic Panel:  Recent Labs Lab 05/25/14 1444 05/28/14 0050  NA 139 140  K 4.4 4.6  CL 102 99  CO2 30 28  GLUCOSE 109* 43*  BUN 17 20  CREATININE 0.8 0.80  CALCIUM 11.0* 11.8*   Liver Function Tests:  Recent Labs Lab 05/28/14 0050  AST 54*  ALT 49  ALKPHOS 154*  BILITOT 0.3  PROT 8.9*  ALBUMIN 3.7   No results found for this basename: LIPASE, AMYLASE,  in the last 168 hours No results found for this basename: AMMONIA,  in the last 168 hours CBC:  Recent Labs Lab 05/25/14 1444 05/28/14 0050  WBC 8.4 24.5*  NEUTROABS 4.1 18.8*  HGB 10.9* 11.8*  HCT 34.1* 37.0*  MCV 77.6* 77.6*  PLT 357.0 320   Cardiac  Enzymes: No results found for this basename: CKTOTAL, CKMB, CKMBINDEX, TROPONINI,  in the last 168 hours  BNP (last 3 results)  Recent Labs  09/28/13 0430 10/02/13 0445 01/29/14 2113  PROBNP 673.0* 2451.0* 1343.0*   CBG:  Recent Labs Lab 05/28/14 0059 05/28/14 0213 05/28/14 0242 05/28/14 0339 05/28/14 0451  GLUCAP 44* 163* 174* 200* 272*    Radiological Exams on Admission: Dg Chest Port 1 View  05/28/2014   CLINICAL DATA:  Fever.  EXAM: PORTABLE CHEST - 1 VIEW  COMPARISON:  05/03/2014  FINDINGS: There is interstitial coarsening, especially at the bases and on the left. The appearance is similar to prior, no definitive pneumonia. No edema, effusion, or pneumothorax. Normal heart size and upper mediastinal contours. Markedly dilated stomach containing gas and fluid material. Remote lateral left clavicle fracture.  IMPRESSION: 1. No definitive pneumonia. If ongoing concern, suggest followup to re-evaluate the left base. 2. Chronically and markedly dilated stomach, likely diabetic gastroparesis.   Electronically Signed   By: Jorje Guild M.D.   On: 05/28/2014 01:31     Assessment/Plan Principal Problem:   SIRS (systemic inflammatory response syndrome) Active Problems:   Type 1 diabetes, uncontrolled, with neuropathy   Gastroparesis due to DM   Spinal cord infarction (history of)   Hypoglycemia   1. SIRS - source not clear. Since patient has had at least 3 admissions recently for pneumonia and the last was in July 21 at [facility] patient has been empirically placed on antibiotics after blood cultures have been a pain. Closely observe. Check sedimentation rate and if elevated may have to check for any osteomyelitis and lower extremity. Patient also has had recently diarrhea and was placed on Flagyl and patient stated that he has resolved. Closely observe. Continue gentle hydration. Recheck chest x-ray. 2. Hypoglycemia in a patient with diabetes mellitus1 -  blood sugar has improved. Hyperglycemia probably secondary to patient not eating after breakfast yesterday. Patient was feeling bloated. Check KUB and if there is no significant  signs of any ileus or obstruction then may try IV Reglan given history of gastroparesis. For now I have placed patient on every 4 CBG checks with sliding scale coverage and Lantus at bedtime. If patient has any further signs of hypoglycemia then may have to hold Lantus. 3. Diabetes mellitus type 2 with gastroparesis - see #2. 4. Spinal cord infarct with tetraplegia.    Code Status: Full code.  Family Communication: None.  Disposition Plan: Admit to inpatient.    Kianni Lheureux N. [facility] Pager [phone].  If 7PM-7AM, please contact night-coverage [email or web address] Password [facility] 05/28/2014, 6:23 AM

## 2014-05-28 NOTE — ED Provider Notes (Signed)
CSN: [id number]     Arrival date & time 05/28/14  0057 History   First MD Initiated Contact with Patient 05/28/14 0048     Chief Complaint  Patient presents with  . 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 States he took his blood sugar this evening and it was 61. EMS was called. Patient denies any nausea or vomiting. He states his abdomen feels full. He denies any cough or shortness of breath. He had his Foley catheter changed today. He has no new weakness or numbness. Denies any fevers, chills or diaphoresis.  Past Medical History  Diagnosis Date  . GERD (gastroesophageal reflux disease)   . Asthma   . Hx MRSA infection     on face  . Gastroparesis   . Diabetic neuropathy   . Seizures   . Stroke     spinal stroke in 4/15  . Diabetes mellitus     sees Dr. Loanne Drilling    Past Surgical History  Procedure Laterality Date  . Tonsillectomy     Family History  Problem Relation Age of Onset  . Diabetes Father   . Hypertension Father   . Asthma      fhx  . Hypertension      fhx  . Stroke      fhx  . Heart disease Mother    History  Substance Use Topics  . Smoking status: Former Smoker    Types: Cigars    Quit date: 01/29/2014  . Smokeless tobacco: Never Used     Comment: e-cigarette  . Alcohol Use: No     Comment: rarely    Review of Systems  Constitutional: Negative for fever and chills.  HENT: Negative for sore throat.   Respiratory: Negative for cough and shortness of breath.   Cardiovascular: Negative for chest pain.  Gastrointestinal: Positive for abdominal distention. Negative for nausea, vomiting, abdominal pain and diarrhea.  Genitourinary: Negative for dysuria and hematuria.  Musculoskeletal: Negative for back pain, myalgias, neck pain and neck stiffness.  Skin:  Negative for rash and wound.  Neurological: Negative for dizziness and headaches.  All other systems reviewed and are negative.     Allergies  Cefuroxime axetil; Penicillins; Tessalon; and Shellfish allergy  Home Medications   Prior to Admission medications   Medication Sig Start Date End Date Taking? Authorizing Provider  albuterol (PROVENTIL) (2.5 MG/3ML) 0.083% nebulizer solution Take 3 mLs (2.5 mg total) by nebulization every 4 (four) hours as needed for wheezing or shortness of breath. 04/27/14   [name], MD  benzoyl peroxide 10 % gel Apply topically daily.    Historical Provider, MD  dexlansoprazole (DEXILANT) 60 MG capsule Take 60 mg by mouth at bedtime.    Historical Provider, MD  diclofenac sodium (VOLTAREN) 1 % GEL Apply 2 g topically daily as needed (for pain).    Historical Provider, MD  diphenoxylate-atropine (LOMOTIL) 2.5-0.025 MG per tablet Take 1 tab twice daily for diarrhea. 05/13/14   Willia Craze, NP  fluconazole (DIFLUCAN) 200 MG tablet Take 1 tablet (200 mg total) by mouth daily. 04/03/14   April K Palumbo-Rasch, MD  glucagon (GLUCAGON EMERGENCY) 1 MG injection Inject 1 mg into the vein once as needed. 04/20/14   [name], MD  insulin aspart (NOVOLOG) 100 UNIT/ML injection Inject 1-17 Units into the skin 4 (four) times daily. Per sliding scale 03/21/14  Shanker Kristeen Mans, MD  insulin glargine (LANTUS) 100 UNIT/ML injection Inject 0.3 mLs (30 Units total) into the skin at bedtime. 03/21/14   Shanker Kristeen Mans, MD  Lido-Capsaicin-Men-Methyl Sal (MEDI-PATCH-LIDOCAINE) 0.5-0.035-5-20 % PTCH Apply topically.    Historical Provider, MD  lidocaine (LIDODERM) 5 % Place 1 patch onto the skin daily. Remove & Discard patch within 12 hours or as directed by MD 05/20/14   Danella Sensing, NP  lidocaine (XYLOCAINE) 2 % jelly Apply topically 4 (four) times daily as needed. To abrasion prn pain. 05/25/14   [name], MD  methocarbamol (ROBAXIN) 750 MG tablet Take 1 tablet (750  mg total) by mouth 4 (four) times daily as needed for muscle spasms. 03/05/14   Bary Leriche, PA-C  methocarbamol (ROBAXIN) 750 MG tablet Take 1 tablet (750 mg total) by mouth 3 (three) times daily. 04/03/14   April K Palumbo-Rasch, MD  metoCLOPramide (REGLAN) 5 MG tablet Take 5 mg by mouth once a week.  03/21/14   Shanker Kristeen Mans, MD  metroNIDAZOLE (FLAGYL) 250 MG tablet Take 1 tablet (250 mg total) by mouth 3 (three) times daily. 05/13/14   Willia Craze, NP  midodrine (PROAMATINE) 5 MG tablet Take 5 mg by mouth daily. To help maintain blood pressure ( may take up to twice a day ) 03/21/14   Jonetta Osgood, MD  morphine (MS CONTIN) 60 MG 12 hr tablet Take 1 tablet (60 mg total) by mouth every 12 (twelve) hours. 04/19/14   Meredith Staggers, MD  Omeprazole-Sodium Bicarbonate (ZEGERID OTC) 20-1100 MG CAPS Take 1 capsule by mouth at bedtime as needed (for heartburn when out of Dexilant).     Historical Provider, MD  ondansetron (ZOFRAN) 8 MG tablet Take 1 tablet (8 mg total) by mouth every 12 (twelve) hours as needed for nausea. 04/20/14   [name], MD  OVER THE COUNTER MEDICATION Take 1 tablet by mouth daily. Allergy medication Claritin    Historical Provider, MD  oxyCODONE (ROXICODONE) 15 MG immediate release tablet Take 15 mg by mouth every 4 (four) hours as needed for pain.    Historical Provider, MD  oxyCODONE-acetaminophen (PERCOCET) 10-325 MG per tablet Take 1 tablet by mouth every 8 (eight) hours as needed for pain. 05/24/14   Danella Sensing, NP  pregabalin (LYRICA) 100 MG capsule Take 1 capsule three times a day and take 2 capsules at bedtime 05/25/14   [name], MD  silver sulfADIAZINE (SILVADENE) 1 % cream Apply 1 application topically 2 (two) times a week. Tuesday and thursday    Historical Provider, MD   BP 120/81  Pulse 112  Temp(Src) 101.1 F (38.4 C) (Oral)  Resp 16  Ht 5\' 7"  (1.702 m)  Wt 145 lb (65.772 kg)  BMI 22.71 kg/m2  SpO2 94% Physical Exam  Nursing note and  vitals reviewed. Constitutional: He is oriented to person, place, and time. He appears well-developed and well-nourished. No distress.  HENT:  Head: Normocephalic and atraumatic.  Mouth/Throat: Oropharynx is clear and moist. No oropharyngeal exudate.  Eyes: EOM are normal. Pupils are equal, round, and reactive to light.  Neck: Normal range of motion. Neck supple.  No meningismus  Cardiovascular: Regular rhythm.   Tachycardia  Pulmonary/Chest: Effort normal. No respiratory distress. He has no wheezes. He has rales (rhonchi in bilateral bases worse on the left.).  Abdominal: Soft. Bowel sounds are normal. He exhibits distension (mildly distended abdomen). He exhibits no mass. There is no tenderness. There is  no rebound and no guarding.  Genitourinary:  Foley catheter draining clear yellow urine  Musculoskeletal: Normal range of motion. He exhibits no edema and no tenderness.  Neurological: He is alert and oriented to person, place, and time.  Patient with good movement of bilateral upper extremities. Has difficulty with fine motor skills of the upper extremities. 0/5 motor in bilateral lower extremities.  Skin: Skin is warm and dry. No rash noted. No erythema.  Psychiatric: He has a normal mood and affect. His behavior is normal.    ED Course  Procedures (including critical care time) Labs Review Labs Reviewed  CBG MONITORING, ED - Abnormal; Notable for the following:    Glucose-Capillary 44 (*)    All other components within normal limits  URINE CULTURE  CULTURE, BLOOD (ROUTINE X 2)  CULTURE, BLOOD (ROUTINE X 2)  CBC WITH DIFFERENTIAL  COMPREHENSIVE METABOLIC PANEL  URINALYSIS, ROUTINE W REFLEX MICROSCOPIC  I-STAT CG4 LACTIC ACID, ED    Imaging Review No results found.   EKG Interpretation None      MDM   Final diagnoses:  None      No source for fever identified. Questionable early pneumonia. Discussed with [facility]. Recommend treatment with broad spectrum  antibiotics and transferred to telemetry bed.  Julianne Rice, MD 05/28/14 [phone]

## 2014-05-28 NOTE — ED Notes (Signed)
MD notified of multiple attempts to obtains second blood culture.  Per MD, 1 blood culture will be enough at this time.  Pt is alert and oriented.  NAD.  Conversing normally with family. No vomiting. Mom stated that pt has had all of his evening meds tonight.

## 2014-05-28 NOTE — Progress Notes (Signed)
Utilization review completed. Missy Baksh, RN, BSN. 

## 2014-05-28 NOTE — Progress Notes (Signed)
Went over discharge instructions and new medications with patient. Patient expressed understanding of these. IV discontinued, patient taken off cardiac monitor. Patient discharged home via ambulance service per request of family due to patient paraplegic status. Roxan Hockey, RN

## 2014-05-28 NOTE — Care Management Note (Signed)
    Page 1 of 1   05/28/2014     2:23:23 PM CARE MANAGEMENT NOTE 05/28/2014  Patient:  Jason [name], Jason [name]   Account Number:  [id number]  Date Initiated:  05/28/2014  Documentation initiated by:  Gemini Beaumier  Subjective/Objective Assessment:   Pt adm on 05/28/14 with hypoglycemia.  PTA, pt resides at home and is cared for by family; pt is paraplegic.     Action/Plan:   CSW consulted for transportation home, as likely dc home today.   Anticipated DC Date:  05/28/2014   Anticipated DC Plan:  [place]  CM consult      Choice offered to / List presented to:             Status of service:  Completed, signed off Medicare Important Message given?   (If response is "NO", the following Medicare IM given date fields will be blank) Date Medicare IM given:   Medicare IM given by:   Date Additional Medicare IM given:   Additional Medicare IM given by:    Discharge Disposition:  HOME/SELF CARE  Per UR Regulation:  Reviewed for med. necessity/level of care/duration of stay  If discussed at [place] of Stay Meetings, dates discussed:    Comments:

## 2014-05-28 NOTE — Progress Notes (Signed)
ANTIBIOTIC CONSULT NOTE - INITIAL  Pharmacy Consult for Vancomycin and Aztreonam Indication: rule out sepsis  Allergies  Allergen Reactions  . Cefuroxime Axetil Anaphylaxis  . Penicillins Anaphylaxis    ?can take amoxicillin?  [name] [Benzonatate] Anaphylaxis  . Shellfish Allergy Itching    Patient Measurements: Height: 5\' 7"  (170.2 cm) Weight: 138 lb 14.2 oz (63 kg) IBW/kg (Calculated) : 66.1  Vital Signs: Temp: 98 F (36.7 C) (08/21 0450) Temp src: Oral (08/21 0450) BP: 113/71 mmHg (08/21 0450) Pulse Rate: 105 (08/21 0450) Intake/Output from previous day: 08/20 0701 - 08/21 0700 In: -  Out: 450 [Urine:450] Intake/Output from this shift: Total I/O In: -  Out: 450 [Urine:450]  Labs:  Recent Labs  05/25/14 1444 05/28/14 0050  WBC 8.4 24.5*  HGB 10.9* 11.8*  PLT 357.0 320  CREATININE 0.8 0.80   Estimated Creatinine Clearance: 121.4 ml/min (by C-G formula based on Cr of 0.8). No results found for this basename: VANCOTROUGH, Corlis Leak, VANCORANDOM, [place], GENTPEAK, GENTRANDOM, TOBRATROUGH, TOBRAPEAK, TOBRARND, AMIKACINPEAK, AMIKACINTROU, AMIKACIN,  in the last 72 hours   Microbiology: Recent Results (from the past 720 hour(s))  CLOSTRIDIUM DIFFICILE BY PCR     Status: None   Collection Time    05/14/14  4:13 PM      Result Value Ref Range Status   C difficile by pcr Not Detected  Not Detected Final   Comment: This test is for use only with liquid or soft stools;     performance characteristics of other clinical specimen     types have not been established.           This assay was performed by Cepheid GeneXpert(R) PCR.     The performance characteristics of this assay have     been determined by [employer]. Performance     characteristics refer to the analytical performance     of the test.    Medical History: Past Medical History  Diagnosis Date  . GERD (gastroesophageal reflux disease)   . Asthma   . Hx MRSA infection     on  face  . Gastroparesis   . Diabetic neuropathy   . Seizures   . Stroke     spinal stroke in 4/15  . Diabetes mellitus     sees Dr. Loanne Drilling     Medications:  Prescriptions prior to admission  Medication Sig Dispense Refill  . albuterol (PROVENTIL) (2.5 MG/3ML) 0.083% nebulizer solution Take 3 mLs (2.5 mg total) by nebulization every 4 (four) hours as needed for wheezing or shortness of breath.  75 mL  3  . benzoyl peroxide 10 % gel Apply topically daily.      Marland Kitchen dexlansoprazole (DEXILANT) 60 MG capsule Take 60 mg by mouth at bedtime.      . diclofenac sodium (VOLTAREN) 1 % GEL Apply 2 g topically daily as needed (for pain).      Marland Kitchen diphenoxylate-atropine (LOMOTIL) 2.5-0.025 MG per tablet Take 1 tab twice daily for diarrhea.  60 tablet  0  . fluconazole (DIFLUCAN) 200 MG tablet Take 1 tablet (200 mg total) by mouth daily.  7 tablet  0  . glucagon (GLUCAGON EMERGENCY) 1 MG injection Inject 1 mg into the vein once as needed.  1 each  12  . insulin aspart (NOVOLOG) 100 UNIT/ML injection Inject 1-17 Units into the skin 4 (four) times daily. Per sliding scale  10 mL  0  . insulin glargine (LANTUS) 100 UNIT/ML injection Inject 0.3 mLs (30  Units total) into the skin at bedtime.  10 mL  0  . Lido-Capsaicin-Men-Methyl Sal (MEDI-PATCH-LIDOCAINE) 0.5-0.035-5-20 % PTCH Apply topically.      . lidocaine (LIDODERM) 5 % Place 1 patch onto the skin daily. Remove & Discard patch within 12 hours or as directed by MD  30 patch  3  . lidocaine (XYLOCAINE) 2 % jelly Apply topically 4 (four) times daily as needed. To abrasion prn pain.  30 mL  11  . methocarbamol (ROBAXIN) 750 MG tablet Take 1 tablet (750 mg total) by mouth 4 (four) times daily as needed for muscle spasms.  120 tablet  1  . methocarbamol (ROBAXIN) 750 MG tablet Take 1 tablet (750 mg total) by mouth 3 (three) times daily.  21 tablet  0  . metoCLOPramide (REGLAN) 5 MG tablet Take 5 mg by mouth once a week.       . metroNIDAZOLE (FLAGYL) 250 MG tablet  Take 1 tablet (250 mg total) by mouth 3 (three) times daily.  30 tablet  0  . midodrine (PROAMATINE) 5 MG tablet Take 5 mg by mouth daily. To help maintain blood pressure ( may take up to twice a day )      . morphine (MS CONTIN) 60 MG 12 hr tablet Take 1 tablet (60 mg total) by mouth every 12 (twelve) hours.  60 tablet  0  . Omeprazole-Sodium Bicarbonate (ZEGERID OTC) 20-1100 MG CAPS Take 1 capsule by mouth at bedtime as needed (for heartburn when out of Dexilant).       . ondansetron (ZOFRAN) 8 MG tablet Take 1 tablet (8 mg total) by mouth every 12 (twelve) hours as needed for nausea.  60 tablet  5  . OVER THE COUNTER MEDICATION Take 1 tablet by mouth daily. Allergy medication Claritin      . oxyCODONE (ROXICODONE) 15 MG immediate release tablet Take 15 mg by mouth every 4 (four) hours as needed for pain.      Marland Kitchen oxyCODONE-acetaminophen (PERCOCET) 10-325 MG per tablet Take 1 tablet by mouth every 8 (eight) hours as needed for pain.  12 tablet  0  . pregabalin (LYRICA) 100 MG capsule Take 1 capsule three times a day and take 2 capsules at bedtime  150 capsule  5  . silver sulfADIAZINE (SILVADENE) 1 % cream Apply 1 application topically 2 (two) times a week. Tuesday and thursday       Assessment: 28 yo male with fevers, possible PNA, for empiric antibiotics Vancomycin 1 g IV given in ED at  0320  Goal of Therapy:  Vancomycin trough level 15-20 mcg/ml  Plan:  Vancomcyin 750 mg IV q12h Aztreonam 1 g IV q8h  Caryl Pina 05/28/2014,6:31 AM

## 2014-05-28 NOTE — ED Notes (Addendum)
Charge nurse notified of low blood sugar.

## 2014-05-28 NOTE — ED Notes (Signed)
Pt checked cbg at bedtime and it was 61.  Called EMS.  Their cbg was 54.  Gave pt oral glucose.

## 2014-05-29 LAB — URINE CULTURE
COLONY COUNT: NO GROWTH
Culture: NO GROWTH

## 2014-05-31 ENCOUNTER — Telehealth: Payer: Self-pay | Admitting: Endocrinology

## 2014-05-31 ENCOUNTER — Telehealth: Payer: Self-pay | Admitting: Family Medicine

## 2014-05-31 NOTE — Telephone Encounter (Signed)
I spoke with pt's mom and she is going to stop by the dentist office in person to get the form and then drop off here at our office.

## 2014-05-31 NOTE — Telephone Encounter (Signed)
See below, do any changes need to be made? Thanks!

## 2014-05-31 NOTE — Telephone Encounter (Signed)
Please advise pt's mother that ov is needed this week.  i don't know who this caller is.

## 2014-05-31 NOTE — Telephone Encounter (Signed)
Mom called to say Jason [name] was admitted to the hospital on 05/28/14 and had a chest xray. Dr Sarajane Jews has ordered a chest xray and Mom wants to know since he had one on Friday if he still needs to have the one Dr Sarajane Jews ordered.

## 2014-05-31 NOTE — Telephone Encounter (Signed)
[name] patient nurse called stated his b/s was 25 was sent to er was admitted to hospital, she would like to know the protocol of patients medication. Please advise [phone] [employer].

## 2014-05-31 NOTE — Telephone Encounter (Signed)
I spoke with pt's mom. 

## 2014-05-31 NOTE — Telephone Encounter (Signed)
No he would not need another CXR unless they ask [name] to get another one after this hospital stay

## 2014-06-01 NOTE — Telephone Encounter (Signed)
Called pt's mother. Pt is unable to come this week for ov. Pt scheduled for 06/09/2014. Pt's mother said pt's blood sugars have been doing better since coming home from the hospital. Readings have been above 70 since coming home.

## 2014-06-02 ENCOUNTER — Encounter: Payer: Self-pay | Admitting: Physical Medicine & Rehabilitation

## 2014-06-02 ENCOUNTER — Encounter (HOSPITAL_BASED_OUTPATIENT_CLINIC_OR_DEPARTMENT_OTHER): Payer: Medicaid Other | Admitting: Physical Medicine & Rehabilitation

## 2014-06-02 VITALS — BP 114/73 | HR 90 | Temp 97.0°F | Resp 14

## 2014-06-02 DIAGNOSIS — E1049 Type 1 diabetes mellitus with other diabetic neurological complication: Secondary | ICD-10-CM

## 2014-06-02 DIAGNOSIS — E119 Type 2 diabetes mellitus without complications: Secondary | ICD-10-CM

## 2014-06-02 DIAGNOSIS — N319 Neuromuscular dysfunction of bladder, unspecified: Secondary | ICD-10-CM | POA: Diagnosis present

## 2014-06-02 DIAGNOSIS — E1065 Type 1 diabetes mellitus with hyperglycemia: Secondary | ICD-10-CM

## 2014-06-02 DIAGNOSIS — G8250 Quadriplegia, unspecified: Secondary | ICD-10-CM

## 2014-06-02 DIAGNOSIS — E1149 Type 2 diabetes mellitus with other diabetic neurological complication: Secondary | ICD-10-CM | POA: Diagnosis present

## 2014-06-02 DIAGNOSIS — G9519 Other vascular myelopathies: Secondary | ICD-10-CM

## 2014-06-02 DIAGNOSIS — IMO0002 Reserved for concepts with insufficient information to code with codable children: Secondary | ICD-10-CM

## 2014-06-02 DIAGNOSIS — G9511 Acute infarction of spinal cord (embolic) (nonembolic): Secondary | ICD-10-CM

## 2014-06-02 DIAGNOSIS — E1142 Type 2 diabetes mellitus with diabetic polyneuropathy: Secondary | ICD-10-CM | POA: Diagnosis present

## 2014-06-02 DIAGNOSIS — Z79899 Other long term (current) drug therapy: Secondary | ICD-10-CM | POA: Diagnosis present

## 2014-06-02 DIAGNOSIS — Z5181 Encounter for therapeutic drug level monitoring: Secondary | ICD-10-CM | POA: Diagnosis present

## 2014-06-02 DIAGNOSIS — E1140 Type 2 diabetes mellitus with diabetic neuropathy, unspecified: Secondary | ICD-10-CM

## 2014-06-02 DIAGNOSIS — J690 Pneumonitis due to inhalation of food and vomit: Secondary | ICD-10-CM

## 2014-06-02 DIAGNOSIS — E1040 Type 1 diabetes mellitus with diabetic neuropathy, unspecified: Secondary | ICD-10-CM

## 2014-06-02 MED ORDER — OXYCODONE HCL 15 MG PO TABS: 15.0000 mg | ORAL_TABLET | Freq: Two times a day (BID) | ORAL | Status: DC | PRN

## 2014-06-02 MED ORDER — MORPHINE SULFATE ER 30 MG PO TBCR: 30.0000 mg | EXTENDED_RELEASE_TABLET | Freq: Two times a day (BID) | ORAL | Status: DC

## 2014-06-02 MED ORDER — LIDOCAINE 5 % EX PTCH: 1.0000 | MEDICATED_PATCH | CUTANEOUS | Status: DC

## 2014-06-02 NOTE — Progress Notes (Signed)
Subjective:    Patient ID: Jason [name], male    DOB: 03-05-1986, 28 y.o.   MRN: [id number]  Sidon is back regarding his spinal cord infarct. He had some difficulties with wound care and diarrhea---may have been infectious (was on flagyl). He also was at [facility] for respiratory depression which may have been related to the increase of his MS contin. He has since recovered and has been doing fairly well other than his sugars which are quite difficult to control. He continues under the care of Dr. Loanne Drilling for his diabetes.   His bowels have regulated and essentially he's moving his bowels at night. He typically has some indication of when he needs to go. Stool is typically formed. He occasionally empties during the day. His skin has cleared up.   His trunk control has improved. He can sit EOB for short periods of time. He has noticed some movement in his legs. He has tried some log roll transfers from his chair by moving the bed and chair to equal heights.    Pain Inventory Average Pain 9 Pain Right Now 9 My pain is sharp, burning, dull, stabbing, tingling and aching  In the last 24 hours, has pain interfered with the following? General activity 9 Relation with others 8 Enjoyment of life 8 What TIME of day is your pain at its worst? evening and night Sleep (in general) Poor  Pain is worse with: bending Pain improves with: therapy/exercise and medication Relief from Meds: 6  Mobility use a wheelchair needs help with transfers  Function disabled: date disabled . I need assistance with the following:  feeding, dressing, bathing, toileting, meal prep, household duties and shopping  Neuro/Psych bladder control problems bowel control problems  Prior Studies Any changes since last visit?  yes x-rays  Physicians involved in your care Any changes since last visit?  no   Family History  Problem Relation Age of Onset  . Diabetes Father   . Hypertension Father   .  Asthma      fhx  . Hypertension      fhx  . Stroke      fhx  . Heart disease Mother    History   Social History  . Marital Status: Single    Spouse Name: N/A    Number of Children: 0  . Years of Education: 11   Occupational History  . Disability    Social History Main Topics  . Smoking status: Former Smoker    Types: Cigars    Quit date: 01/29/2014  . Smokeless tobacco: Never Used     Comment: e-cigarette  . Alcohol Use: No     Comment: rarely  . Drug Use: No  . Sexual Activity: None   Other Topics Concern  . None   Social History Narrative   Patient lives at home with mother father.    Patient has 2 children.    Patient is single.    Patient was left hand but after stroke patient is now right handed.    Patient has  11th grade education.    Past Surgical History  Procedure Laterality Date  . Tonsillectomy     Past Medical History  Diagnosis Date  . GERD (gastroesophageal reflux disease)   . Asthma   . Hx MRSA infection     on face  . Gastroparesis   . Diabetic neuropathy   . Seizures   . Stroke     spinal stroke in  4/15  . Diabetes mellitus     sees Dr. Loanne Drilling    BP 114/73  Pulse 90  Resp 14  SpO2 98%  Opioid Risk Score:   Fall Risk Score: Low Fall Risk (0-5 points)  Review of Systems  Respiratory: Positive for shortness of breath.   Gastrointestinal: Positive for nausea and vomiting.       Bowel Control Problems  Endocrine:       Low blood sugar  Genitourinary:       Bladder control problems  All other systems reviewed and are negative.      Objective:   Physical Exam  HEENT: normal  Cardio: RRR and no murmur  Resp: CTA B/L and unlabored  GI: BS positive and mildly distended and tympanitic, mild tenderness  Extremity: No Edema  Skin: right elbow dressed.  Neuro: Alert/Oriented, Abnormal Sensory absent in feet and Abnormal Motor 0/5 in LLE RLE: tr to 1/5. ,tr 1/5 finger extension and tr hand intrinsic. Wrist extension 4+ to 5/5  right and 3+ on left, 4/5 tricep right, 3- left,  biceps 5/5. Decreased sensation below elbows bilaterally. Minimal trunk control. Cannot deep breath to cough. .  Musc/Skel: Generalized upper thoracic and lumbar spine tenderness.  Gen NAD. He has gained weight  Psych: generally alert, appropriate  GU: foley in place.    Assessment/Plan:  1. 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 Chronic Pain Management: increase MS contin to 60 mg bid  - heating pad for shoulder/neck symptoms, good posture when in bed and chair  -continue robaxin for muscle spasms.   -lyrica  100mg  tid and 200mg  qhs  -resume MS contin 30mg  q12--will need to be vigilant for neurosedation -use oxycodone 15mg  q12 prn -lidoderm patches if available---may be restricted due to medicaid 7. DM type 2 with gastroparesis: continues to be brittle---follow up per endocrine/pcp  8. Chronic wounds: continued wound mgt.  9. Constipation/diabetic gastroparesis/neurogenic bowel: reglan  10. Severe diabetic peripheral neuropathy: lyrica  11. Pulmonary: deep breathing, IS/FV, CPAP---was reinforced today.  -quad cough, assistive techniques  -keep hob elevated  -asked mom as above to be very observant of neurosedation with meds 13. CV---midodrine  14. Neurogenic bladder/urethral ulceration:  Urology followup----eventual [facility]  I will see him back in one month for follow up. Thirty minutes of face to face patient care time were spent during this visit. All questions were encouraged and answered.

## 2014-06-02 NOTE — Patient Instructions (Signed)
BE VERY OBSERVANT FOR SEDATION ON THE MS CONTIN---IF YOU NOTICE THIS HAPPENS, PLEASE STOP MS CONTIN AND CALL us HERE  PLEASE CALL ME WITH ANY PROBLEMS OR QUESTIONS (#[phone]).

## 2014-06-03 ENCOUNTER — Telehealth: Payer: Self-pay

## 2014-06-03 ENCOUNTER — Ambulatory Visit: Payer: Medicaid Other | Admitting: Occupational Therapy

## 2014-06-03 DIAGNOSIS — Z5189 Encounter for other specified aftercare: Secondary | ICD-10-CM | POA: Diagnosis present

## 2014-06-03 DIAGNOSIS — G9519 Other vascular myelopathies: Secondary | ICD-10-CM | POA: Diagnosis not present

## 2014-06-03 DIAGNOSIS — M6281 Muscle weakness (generalized): Secondary | ICD-10-CM | POA: Diagnosis not present

## 2014-06-03 DIAGNOSIS — J45909 Unspecified asthma, uncomplicated: Secondary | ICD-10-CM | POA: Diagnosis not present

## 2014-06-03 DIAGNOSIS — K3184 Gastroparesis: Secondary | ICD-10-CM | POA: Diagnosis not present

## 2014-06-03 DIAGNOSIS — M629 Disorder of muscle, unspecified: Secondary | ICD-10-CM | POA: Diagnosis not present

## 2014-06-03 DIAGNOSIS — R279 Unspecified lack of coordination: Secondary | ICD-10-CM | POA: Diagnosis not present

## 2014-06-03 DIAGNOSIS — M62838 Other muscle spasm: Secondary | ICD-10-CM | POA: Diagnosis not present

## 2014-06-03 DIAGNOSIS — R5381 Other malaise: Secondary | ICD-10-CM | POA: Diagnosis not present

## 2014-06-03 DIAGNOSIS — K219 Gastro-esophageal reflux disease without esophagitis: Secondary | ICD-10-CM | POA: Diagnosis not present

## 2014-06-03 DIAGNOSIS — E109 Type 1 diabetes mellitus without complications: Secondary | ICD-10-CM | POA: Diagnosis not present

## 2014-06-03 LAB — CULTURE, BLOOD (ROUTINE X 2): Culture: NO GROWTH

## 2014-06-03 NOTE — Telephone Encounter (Signed)
Home health care is expiring since pt has been in their care since May 2015.  This is a medicade issue.  Pt will be discharged next week.  The nurse [name] is calling to get Dr. Barbie Banner input on changing pt's catheter. Pt's mother feels like she can do it and the nurse feels like she is capable of doing it.  If this is not an option then pt can come into the office to have it done but there is a transportation issue with this.  Pls advise and give [name] a call back.

## 2014-06-08 ENCOUNTER — Telehealth: Payer: Self-pay | Admitting: Family Medicine

## 2014-06-08 NOTE — Telephone Encounter (Signed)
Tanya from [facility] [phone] would like to get a verbal order to teach pt's mom how to change catheter, this was ordered for every other week. Pt's insurance is not going to continue to cover the nurse coming out to home to take care of this. Per Dr. Sarajane Jews we can give a verbal order for nurse to teach mom how to change the catheter. I spoke with [name] and gave the verbal order.

## 2014-06-09 ENCOUNTER — Encounter: Payer: Self-pay | Admitting: Endocrinology

## 2014-06-09 ENCOUNTER — Ambulatory Visit (INDEPENDENT_AMBULATORY_CARE_PROVIDER_SITE_OTHER): Payer: Medicaid Other | Admitting: Endocrinology

## 2014-06-09 VITALS — BP 112/60 | HR 78 | Temp 97.8°F

## 2014-06-09 DIAGNOSIS — E1040 Type 1 diabetes mellitus with diabetic neuropathy, unspecified: Secondary | ICD-10-CM

## 2014-06-09 DIAGNOSIS — IMO0002 Reserved for concepts with insufficient information to code with codable children: Secondary | ICD-10-CM

## 2014-06-09 DIAGNOSIS — E1065 Type 1 diabetes mellitus with hyperglycemia: Principal | ICD-10-CM

## 2014-06-09 DIAGNOSIS — E1142 Type 2 diabetes mellitus with diabetic polyneuropathy: Secondary | ICD-10-CM

## 2014-06-09 DIAGNOSIS — E1049 Type 1 diabetes mellitus with other diabetic neurological complication: Secondary | ICD-10-CM

## 2014-06-09 MED ORDER — GLUCOSE BLOOD VI STRP: 1.0000 | ORAL_STRIP | Freq: Four times a day (QID) | Status: DC

## 2014-06-09 NOTE — Patient Instructions (Addendum)
check your blood sugar 4 times a day: before the 3 meals, and at bedtime.  also check if you have symptoms of your blood sugar being too high or too low.  please keep a record of the readings and bring it to your next appointment here.  You can write it on any piece of paper.  please call us sooner if your blood sugar goes below 70, or if you have a lot of readings over 200. For now, please reduce the lantus to 20 units at bedtime, and:  Take novolog 5 units 3 times a day (just before each meal) Also, take 2 extra units of novolog any time it is over 300.   Please call next week to tell us how the blood sugar is doing.  Please come back for a follow-up appointment in 1 month.

## 2014-06-09 NOTE — Progress Notes (Signed)
Subjective:    Patient ID: Jason [name], male    DOB: 16-May-1986, 28 y.o.   MRN: [id number]  HPI Hx is from patient and his mother: pt returns for f/u of insulin-requiring DM (dx'ed 2355; complicated by sensory neuropathy of the lower extremities, autonomic neuropathy, and a spinal cord infarction in early 2015; he has been on insulin since dx; exercise is severely limited by tetraplegia; he has had multiple episodes of severe hypoglycemia; most recent episode of DKA was approx 2011; he has never had pancreatitis). Since last ov, he was again hospitalized for severe hypoglycemia.  He was changed to lantus 25 units qhs, and PRN novolog (averages approx 20 units per day).  no cbg record, but states cbg's vary from 51-500.  It is in general higher as the day goes on.   Past Medical History  Diagnosis Date  . GERD (gastroesophageal reflux disease)   . Asthma   . Hx MRSA infection     on face  . Gastroparesis   . Diabetic neuropathy   . Seizures   . Stroke     spinal stroke in 4/15  . Diabetes mellitus     sees Dr. Loanne Drilling     Past Surgical History  Procedure Laterality Date  . Tonsillectomy      History   Social History  . Marital Status: Single    Spouse Name: N/A    Number of Children: 0  . Years of Education: 11   Occupational History  . Disability    Social History Main Topics  . Smoking status: Former Smoker    Types: Cigars    Quit date: 01/29/2014  . Smokeless tobacco: Never Used     Comment: e-cigarette  . Alcohol Use: No     Comment: rarely  . Drug Use: No  . Sexual Activity: Not on file   Other Topics Concern  . Not on file   Social History Narrative   Patient lives at home with mother father.    Patient has 2 children.    Patient is single.    Patient was left hand but after stroke patient is now right handed.    Patient has  11th grade education.     Current Outpatient Prescriptions on File Prior to Visit  Medication Sig Dispense Refill  .  acetaminophen (TYLENOL) 325 MG tablet Take 2 tablets (650 mg total) by mouth every 6 (six) hours as needed for mild pain (or Fever >/= 101).      Marland Kitchen albuterol (PROVENTIL) (2.5 MG/3ML) 0.083% nebulizer solution Take 3 mLs (2.5 mg total) by nebulization every 4 (four) hours as needed for wheezing or shortness of breath.  75 mL  3  . dexlansoprazole (DEXILANT) 60 MG capsule Take 60 mg by mouth daily.      . diclofenac sodium (VOLTAREN) 1 % GEL Apply 2 g topically daily as needed (for pain).      Marland Kitchen diphenoxylate-atropine (LOMOTIL) 2.5-0.025 MG per tablet Take 1 tablet by mouth 2 (two) times daily.      Marland Kitchen glucagon (GLUCAGON EMERGENCY) 1 MG injection Inject 1 mg into the vein once as needed.  1 each  12  . insulin glargine (LANTUS) 100 UNIT/ML injection Inject 20 Units into the skin at bedtime.       . lidocaine (LIDODERM) 5 % Place 1 patch onto the skin daily. Remove & Discard patch within 12 hours or as directed by MD  30 patch  3  . lidocaine (  XYLOCAINE) 2 % jelly Apply 1 application topically 3 (three) times daily as needed (for pain).      Marland Kitchen loratadine (CLARITIN) 10 MG tablet Take 10 mg by mouth 2 (two) times daily as needed for allergies.      . methocarbamol (ROBAXIN) 750 MG tablet Take 1 tablet (750 mg total) by mouth 4 (four) times daily as needed for muscle spasms.  120 tablet  1  . metoCLOPramide (REGLAN) 5 MG tablet Take 1 tablet (5 mg total) by mouth 2 (two) times a week. And as needed for nausea      . midodrine (PROAMATINE) 5 MG tablet Take 5 mg by mouth 2 (two) times daily as needed (for blood pressure).      . morphine (MS CONTIN) 30 MG 12 hr tablet Take 1 tablet (30 mg total) by mouth every 12 (twelve) hours.  60 tablet  0  . ondansetron (ZOFRAN) 8 MG tablet Take 1 tablet (8 mg total) by mouth every 12 (twelve) hours as needed for nausea.  60 tablet  5  . oxyCODONE (ROXICODONE) 15 MG immediate release tablet Take 1 tablet (15 mg total) by mouth every 12 (twelve) hours as needed for pain.   60 tablet  0  . oxyCODONE-acetaminophen (PERCOCET) 10-325 MG per tablet Take 1 tablet by mouth every 6 (six) hours as needed for pain.      . pregabalin (LYRICA) 100 MG capsule Take 100-200 mg by mouth 4 (four) times daily. Take 100 mg by mouth in the morning, take 100 mg by mouth in the afternoon, take 100 mg by mouth in the evening and take 200 mg by mouth at bedtime.      . silver sulfADIAZINE (SILVADENE) 1 % cream Apply 1 application topically 2 (two) times a week. Tuesday and thursday       No current facility-administered medications on file prior to visit.    Allergies  Allergen Reactions  . Cefuroxime Axetil Anaphylaxis  . Penicillins Anaphylaxis    ?can take amoxicillin?  [name] [Benzonatate] Anaphylaxis  . Shellfish Allergy Itching    Family History  Problem Relation Age of Onset  . Diabetes Father   . Hypertension Father   . Asthma      fhx  . Hypertension      fhx  . Stroke      fhx  . Heart disease Mother     BP 112/60  Pulse 78  Temp(Src) 97.8 F (36.6 C) (Oral)  SpO2 98%   Review of Systems He has gained a few lbs.  Denies n/v.      Objective:   Physical Exam VITAL SIGNS:  See vs page GENERAL: no distress SKIN:  Insulin injection sites at the anterior abdomen are normal.     Lab Results  Component Value Date   HGBA1C 9.0* 05/28/2014      Assessment & Plan:  Severe hypoglycemia: recurrent DM: The pattern of his cbg's indicates he needs some adjustment in his therapy.   Patient is advised the following: Patient Instructions  check your blood sugar 4 times a day: before the 3 meals, and at bedtime.  also check if you have symptoms of your blood sugar being too high or too low.  please keep a record of the readings and bring it to your next appointment here.  You can write it on any piece of paper.  please call us sooner if your blood sugar goes below 70, or if you have a lot  of readings over 200. For now, please reduce the lantus to 20 units  at bedtime, and:  Take novolog 5 units 3 times a day (just before each meal) Also, take 2 extra units of novolog any time it is over 300.   Please call next week to tell us how the blood sugar is doing.  Please come back for a follow-up appointment in 1 month.

## 2014-06-10 ENCOUNTER — Telehealth: Payer: Self-pay | Admitting: Family Medicine

## 2014-06-10 NOTE — Telephone Encounter (Signed)
[facility] called today requesting to speak w/ Dr Sarajane Jews concerning pt's appt today for dental work. Clinic states they received correspondence w/ dr fry's signature, but it did not state yes or no. Advised dr fry not in the office  they would need to fu w/ Dr Sarajane Jews for the final word .  Nurse advise she would relay message to the family and let them decide.

## 2014-06-10 NOTE — Telephone Encounter (Signed)
[facility] fax over another form and pt was waiting and had to reschedule to 06-15-14. Dr Elease Hashimoto did not feel comfortable signing form and pt is aware form will wait for dr fry to sign

## 2014-06-11 NOTE — Telephone Encounter (Signed)
I gave paper work to [employer].

## 2014-06-11 NOTE — Telephone Encounter (Signed)
Paper work was faxed on 06-11-14

## 2014-06-15 ENCOUNTER — Encounter: Payer: Medicaid Other | Admitting: Registered Nurse

## 2014-06-15 ENCOUNTER — Telehealth: Payer: Self-pay

## 2014-06-15 NOTE — Telephone Encounter (Signed)
Patient's Lidocaine patches were denied after submitting a Prior Authorization. [name] states he is now doing therapy and really needs something to the same effect as Lidocaine patches. Please advise.

## 2014-06-15 NOTE — Telephone Encounter (Signed)
Please increase novolog to 7 units 3 times a day (just before each meal). Please continue the extra novolog as needed.

## 2014-06-15 NOTE — Telephone Encounter (Signed)
Patient b/s is running extremely high, please advise

## 2014-06-15 NOTE — Telephone Encounter (Signed)
Called and spoke with pt's mother. She states that since last ov on 9/2 pt blood sugar has been very high and the meter not been able to read the exact number. Meter will read up to 500. Mother states pt has only had a few reading where the sugar was actually read by the meter and it was reading in the high 300's. Mother states that the readings have been consistently high in the morning, afternoon and evening. Confirmed insulin dosgae, pt is currently taking 20 units of Lantus at bedtime and Novolog 5 units 3 times a day and an extra 2 units for any blood sugar over 200.  Please advise, Thanks!

## 2014-06-15 NOTE — Telephone Encounter (Signed)
Pt's mother advised of new instructions.

## 2014-06-16 MED ORDER — LIDOCAINE 5 % EX OINT: 1.0000 "application " | TOPICAL_OINTMENT | Freq: Three times a day (TID) | CUTANEOUS | Status: DC | PRN

## 2014-06-16 NOTE — Telephone Encounter (Signed)
Notified Mrs Jason [name] of the med sent to pharmacy.

## 2014-06-16 NOTE — Telephone Encounter (Signed)
He can try lidocaine ointment 5% to same areas---apply TID.  #1 month supply, 4 rf

## 2014-06-16 NOTE — Telephone Encounter (Signed)
Left message to call office

## 2014-06-21 ENCOUNTER — Telehealth: Payer: Self-pay | Admitting: Family Medicine

## 2014-06-21 ENCOUNTER — Ambulatory Visit: Payer: Medicaid Other | Attending: Neurology | Admitting: Physical Therapy

## 2014-06-21 ENCOUNTER — Other Ambulatory Visit: Payer: Medicaid Other

## 2014-06-21 DIAGNOSIS — M2420 Disorder of ligament, unspecified site: Secondary | ICD-10-CM | POA: Diagnosis not present

## 2014-06-21 DIAGNOSIS — R5381 Other malaise: Secondary | ICD-10-CM | POA: Insufficient documentation

## 2014-06-21 DIAGNOSIS — M6281 Muscle weakness (generalized): Secondary | ICD-10-CM | POA: Insufficient documentation

## 2014-06-21 DIAGNOSIS — J45909 Unspecified asthma, uncomplicated: Secondary | ICD-10-CM | POA: Insufficient documentation

## 2014-06-21 DIAGNOSIS — G9519 Other vascular myelopathies: Secondary | ICD-10-CM | POA: Insufficient documentation

## 2014-06-21 DIAGNOSIS — M62838 Other muscle spasm: Secondary | ICD-10-CM | POA: Insufficient documentation

## 2014-06-21 DIAGNOSIS — E109 Type 1 diabetes mellitus without complications: Secondary | ICD-10-CM | POA: Insufficient documentation

## 2014-06-21 DIAGNOSIS — K3184 Gastroparesis: Secondary | ICD-10-CM | POA: Insufficient documentation

## 2014-06-21 DIAGNOSIS — K219 Gastro-esophageal reflux disease without esophagitis: Secondary | ICD-10-CM | POA: Diagnosis not present

## 2014-06-21 DIAGNOSIS — R279 Unspecified lack of coordination: Secondary | ICD-10-CM | POA: Diagnosis not present

## 2014-06-21 DIAGNOSIS — Z5189 Encounter for other specified aftercare: Secondary | ICD-10-CM | POA: Insufficient documentation

## 2014-06-21 DIAGNOSIS — M629 Disorder of muscle, unspecified: Secondary | ICD-10-CM | POA: Diagnosis not present

## 2014-06-21 NOTE — Telephone Encounter (Signed)
I have signed all orders that we received

## 2014-06-21 NOTE — Telephone Encounter (Signed)
[facility] following up on skilled nursing orders for pt. They have not received. Making sure you have them and will return soon.

## 2014-06-23 NOTE — Telephone Encounter (Signed)
I spoke with Tiffany at [facility] and yes she did receive the orders.

## 2014-06-25 ENCOUNTER — Other Ambulatory Visit: Payer: Self-pay | Admitting: Physical Medicine & Rehabilitation

## 2014-06-28 ENCOUNTER — Encounter: Payer: Self-pay | Admitting: Family Medicine

## 2014-06-28 ENCOUNTER — Ambulatory Visit: Payer: Medicaid Other | Admitting: Endocrinology

## 2014-06-28 ENCOUNTER — Ambulatory Visit (INDEPENDENT_AMBULATORY_CARE_PROVIDER_SITE_OTHER): Payer: Medicaid Other | Admitting: Family Medicine

## 2014-06-28 VITALS — BP 100/69 | HR 88 | Temp 98.1°F

## 2014-06-28 DIAGNOSIS — G9511 Acute infarction of spinal cord (embolic) (nonembolic): Secondary | ICD-10-CM

## 2014-06-28 DIAGNOSIS — IMO0002 Reserved for concepts with insufficient information to code with codable children: Secondary | ICD-10-CM

## 2014-06-28 DIAGNOSIS — E1065 Type 1 diabetes mellitus with hyperglycemia: Secondary | ICD-10-CM

## 2014-06-28 DIAGNOSIS — E1040 Type 1 diabetes mellitus with diabetic neuropathy, unspecified: Secondary | ICD-10-CM

## 2014-06-28 DIAGNOSIS — N390 Urinary tract infection, site not specified: Secondary | ICD-10-CM

## 2014-06-28 DIAGNOSIS — N319 Neuromuscular dysfunction of bladder, unspecified: Secondary | ICD-10-CM

## 2014-06-28 DIAGNOSIS — E1049 Type 1 diabetes mellitus with other diabetic neurological complication: Secondary | ICD-10-CM

## 2014-06-28 DIAGNOSIS — G9519 Other vascular myelopathies: Secondary | ICD-10-CM

## 2014-06-28 DIAGNOSIS — E1142 Type 2 diabetes mellitus with diabetic polyneuropathy: Secondary | ICD-10-CM

## 2014-06-28 LAB — POCT URINALYSIS DIPSTICK
BILIRUBIN UA: NEGATIVE
Glucose, UA: 2000
Ketones, UA: NEGATIVE
Spec Grav, UA: 1.015
Urobilinogen, UA: 0.2
pH, UA: 6

## 2014-06-28 MED ORDER — LEVOFLOXACIN 500 MG PO TABS: 500.0000 mg | ORAL_TABLET | Freq: Every day | ORAL | Status: AC

## 2014-06-28 NOTE — Progress Notes (Signed)
Pre visit review using our clinic review tool, if applicable. No additional management support is needed unless otherwise documented below in the visit note. 

## 2014-06-28 NOTE — Progress Notes (Signed)
   Subjective:    Patient ID: KEENEN ROESSNER, male    DOB: 08-Nov-1985, 28 y.o.   MRN: [id number]  HPI Here with mother to discuss his lipids and for a possible UTI. Unfortunately he ate lunch right before this appt so we cannot draw labs today. His mother noticed 2 days ago that his urine appeared dark and cloudy and she has given him levaquin for the past 2 days. He had no symptoms that he knew about. He had a fever of 99.3 degrees yesterday but not today.    Review of Systems  Constitutional: Positive for fever.  Respiratory: Negative.   Cardiovascular: Negative.   Gastrointestinal: Negative.        Objective:   Physical Exam  Constitutional: He appears well-developed and well-nourished.  Cardiovascular: Normal rate, regular rhythm, normal heart sounds and intact distal pulses.   Pulmonary/Chest: Effort normal and breath sounds normal.  Abdominal: Soft. Bowel sounds are normal. He exhibits no distension. There is no tenderness. There is no rebound and no guarding.          Assessment & Plan:  Treat with a 7 day course of Levaquin. Culture the urine. He will get fasting lipids drawn later this week

## 2014-06-28 NOTE — Addendum Note (Signed)
Addended by: Aggie Hacker A on: 06/28/2014 02:48 PM   Modules accepted: Orders

## 2014-06-29 ENCOUNTER — Ambulatory Visit: Payer: Medicaid Other | Admitting: Physical Therapy

## 2014-06-30 ENCOUNTER — Telehealth: Payer: Self-pay | Admitting: *Deleted

## 2014-06-30 DIAGNOSIS — E1065 Type 1 diabetes mellitus with hyperglycemia: Secondary | ICD-10-CM

## 2014-06-30 DIAGNOSIS — IMO0002 Reserved for concepts with insufficient information to code with codable children: Secondary | ICD-10-CM

## 2014-06-30 DIAGNOSIS — E1040 Type 1 diabetes mellitus with diabetic neuropathy, unspecified: Secondary | ICD-10-CM

## 2014-06-30 DIAGNOSIS — G8250 Quadriplegia, unspecified: Secondary | ICD-10-CM

## 2014-06-30 DIAGNOSIS — G9511 Acute infarction of spinal cord (embolic) (nonembolic): Secondary | ICD-10-CM

## 2014-06-30 DIAGNOSIS — N319 Neuromuscular dysfunction of bladder, unspecified: Secondary | ICD-10-CM

## 2014-06-30 DIAGNOSIS — E1140 Type 2 diabetes mellitus with diabetic neuropathy, unspecified: Secondary | ICD-10-CM

## 2014-06-30 LAB — URINE CULTURE

## 2014-06-30 MED ORDER — OXYCODONE HCL 15 MG PO TABS: 15.0000 mg | ORAL_TABLET | Freq: Two times a day (BID) | ORAL | Status: DC | PRN

## 2014-06-30 MED ORDER — MORPHINE SULFATE ER 30 MG PO TBCR: 30.0000 mg | EXTENDED_RELEASE_TABLET | Freq: Two times a day (BID) | ORAL | Status: DC

## 2014-06-30 NOTE — Telephone Encounter (Signed)
Will be out of pain medication before his appt with Dr Naaman Plummer..  Last fill 06/05/14 and appt is 07/16/14.  Rx printed for Zella Ball to sign.  He must keep appt with Dr Naaman Plummer  He needs his reglan refilled.  Last prescriber was Doree Barthel MD from hospital.  Do you want to refill this?

## 2014-06-30 NOTE — Telephone Encounter (Signed)
I'm ok with filling his reglan

## 2014-07-01 MED ORDER — METOCLOPRAMIDE HCL 5 MG PO TABS: 5.0000 mg | ORAL_TABLET | ORAL | Status: DC

## 2014-07-06 ENCOUNTER — Ambulatory Visit: Payer: Medicaid Other | Admitting: Physical Therapy

## 2014-07-06 ENCOUNTER — Encounter: Payer: Medicaid Other | Admitting: Occupational Therapy

## 2014-07-06 ENCOUNTER — Ambulatory Visit: Payer: Medicaid Other | Admitting: Occupational Therapy

## 2014-07-06 DIAGNOSIS — Z5189 Encounter for other specified aftercare: Secondary | ICD-10-CM | POA: Diagnosis not present

## 2014-07-07 MED ORDER — METHOCARBAMOL 750 MG PO TABS: 750.0000 mg | ORAL_TABLET | Freq: Four times a day (QID) | ORAL | Status: DC | PRN

## 2014-07-07 NOTE — Telephone Encounter (Signed)
Requesting refill on robaxin. Done.

## 2014-07-09 ENCOUNTER — Ambulatory Visit: Payer: Medicaid Other | Admitting: Endocrinology

## 2014-07-13 ENCOUNTER — Ambulatory Visit: Payer: Medicaid Other | Attending: Neurology | Admitting: Physical Therapy

## 2014-07-13 ENCOUNTER — Telehealth: Payer: Self-pay | Admitting: *Deleted

## 2014-07-13 DIAGNOSIS — R279 Unspecified lack of coordination: Secondary | ICD-10-CM | POA: Diagnosis present

## 2014-07-13 DIAGNOSIS — I69898 Other sequelae of other cerebrovascular disease: Secondary | ICD-10-CM | POA: Insufficient documentation

## 2014-07-13 DIAGNOSIS — M6281 Muscle weakness (generalized): Secondary | ICD-10-CM | POA: Insufficient documentation

## 2014-07-13 DIAGNOSIS — R5381 Other malaise: Secondary | ICD-10-CM | POA: Insufficient documentation

## 2014-07-13 DIAGNOSIS — E109 Type 1 diabetes mellitus without complications: Secondary | ICD-10-CM | POA: Insufficient documentation

## 2014-07-13 NOTE — Telephone Encounter (Signed)
Resubmitted PA for lidoderm 5%patches to medicaid.

## 2014-07-14 NOTE — Telephone Encounter (Signed)
Close encounter 

## 2014-07-15 ENCOUNTER — Ambulatory Visit: Payer: Medicaid Other

## 2014-07-15 ENCOUNTER — Telehealth: Payer: Self-pay | Admitting: Family Medicine

## 2014-07-15 DIAGNOSIS — M6281 Muscle weakness (generalized): Secondary | ICD-10-CM | POA: Diagnosis not present

## 2014-07-15 MED ORDER — LEVOFLOXACIN 500 MG PO TABS: 500.0000 mg | ORAL_TABLET | Freq: Every day | ORAL | Status: DC

## 2014-07-15 MED ORDER — FUROSEMIDE 40 MG PO TABS: 40.0000 mg | ORAL_TABLET | Freq: Every day | ORAL | Status: DC

## 2014-07-15 NOTE — Telephone Encounter (Signed)
I sent both scripts e-scribe and spoke with pt's mom [name].

## 2014-07-15 NOTE — Telephone Encounter (Signed)
Mom called to say Jason [name] has bacteria in catheter line and some swelling in his legs.He is not running a fever. She would like a call back concerning this

## 2014-07-15 NOTE — Telephone Encounter (Signed)
Call in Levaquin 500 mg daily for 10 days. Also Lasix 40 mg daily, #90 with 3 rf

## 2014-07-15 NOTE — Telephone Encounter (Signed)
I spoke with [name] pt's mom. She stated that pt's urine is milky & cloudy looking. He had some swelling in the genital area and it has now gone down. He still has swelling in both legs from the knee down. He did finish up a previous antibiotic, however when pt was seen here on 06/28/14 we were suppose to send in script for Levaquin. Pt has rehab today and seeing Dr. Harvin Hazel tomorrow ( pain management ).

## 2014-07-16 ENCOUNTER — Encounter: Payer: Self-pay | Admitting: Physical Medicine & Rehabilitation

## 2014-07-16 ENCOUNTER — Other Ambulatory Visit (INDEPENDENT_AMBULATORY_CARE_PROVIDER_SITE_OTHER): Payer: Medicaid Other

## 2014-07-16 ENCOUNTER — Encounter: Payer: Medicaid Other | Attending: Registered Nurse | Admitting: Physical Medicine & Rehabilitation

## 2014-07-16 VITALS — BP 133/89 | HR 89 | Resp 14

## 2014-07-16 DIAGNOSIS — N390 Urinary tract infection, site not specified: Secondary | ICD-10-CM | POA: Diagnosis present

## 2014-07-16 DIAGNOSIS — N319 Neuromuscular dysfunction of bladder, unspecified: Secondary | ICD-10-CM

## 2014-07-16 DIAGNOSIS — G9519 Other vascular myelopathies: Secondary | ICD-10-CM | POA: Diagnosis present

## 2014-07-16 DIAGNOSIS — G8250 Quadriplegia, unspecified: Secondary | ICD-10-CM

## 2014-07-16 DIAGNOSIS — Z5181 Encounter for therapeutic drug level monitoring: Secondary | ICD-10-CM

## 2014-07-16 DIAGNOSIS — G9511 Acute infarction of spinal cord (embolic) (nonembolic): Secondary | ICD-10-CM

## 2014-07-16 DIAGNOSIS — IMO0002 Reserved for concepts with insufficient information to code with codable children: Secondary | ICD-10-CM

## 2014-07-16 DIAGNOSIS — E1041 Type 1 diabetes mellitus with diabetic mononeuropathy: Secondary | ICD-10-CM

## 2014-07-16 DIAGNOSIS — E1040 Type 1 diabetes mellitus with diabetic neuropathy, unspecified: Secondary | ICD-10-CM

## 2014-07-16 DIAGNOSIS — G0489 Other myelitis: Secondary | ICD-10-CM

## 2014-07-16 DIAGNOSIS — Z79899 Other long term (current) drug therapy: Secondary | ICD-10-CM | POA: Diagnosis present

## 2014-07-16 DIAGNOSIS — E1065 Type 1 diabetes mellitus with hyperglycemia: Secondary | ICD-10-CM

## 2014-07-16 DIAGNOSIS — E1140 Type 2 diabetes mellitus with diabetic neuropathy, unspecified: Secondary | ICD-10-CM

## 2014-07-16 DIAGNOSIS — G373 Acute transverse myelitis in demyelinating disease of central nervous system: Secondary | ICD-10-CM

## 2014-07-16 LAB — LIPID PANEL
CHOL/HDL RATIO: 5
CHOLESTEROL: 136 mg/dL (ref 0–200)
HDL: 29.7 mg/dL — AB (ref >39.00)
LDL CALC: 85 mg/dL (ref 0–99)
NonHDL: 106.3
TRIGLYCERIDES: 108 mg/dL (ref 0.0–149.0)
VLDL: 21.6 mg/dL (ref 0.0–40.0)

## 2014-07-16 MED ORDER — MORPHINE SULFATE ER 30 MG PO TBCR: 30.0000 mg | EXTENDED_RELEASE_TABLET | Freq: Two times a day (BID) | ORAL | Status: DC

## 2014-07-16 MED ORDER — FLUCONAZOLE 100 MG PO TABS: 100.0000 mg | ORAL_TABLET | Freq: Every day | ORAL | Status: DC

## 2014-07-16 MED ORDER — BACLOFEN 10 MG PO TABS: 10.0000 mg | ORAL_TABLET | ORAL | Status: DC

## 2014-07-16 MED ORDER — OXYCODONE-ACETAMINOPHEN 10-325 MG PO TABS: 1.0000 | ORAL_TABLET | Freq: Three times a day (TID) | ORAL | Status: DC | PRN

## 2014-07-16 NOTE — Addendum Note (Signed)
Addended by: Valeria Batman on: 07/16/2014 01:21 PM   Modules accepted: Orders

## 2014-07-16 NOTE — Patient Instructions (Addendum)
PLEASE CALL ME WITH ANY PROBLEMS OR QUESTIONS (#[phone]).    I WOULD BE HAPPY TO WORK WITH THE [employer] REGARDING PRESCRIPTION.   BACLOFEN: (MUSCLE RELAXANT)- STOP ROBAXIN 10MG  AT NIGHT TIME FOR 4 DAYS THEN 1OMG TWICE DAILY FOR 4 DAYS THEN  10MG  3X DAILY FOR 4 DAYS, THEN  20MG  AT NIGHT AND 10MG  TWICE DAILY THEREAFTER

## 2014-07-16 NOTE — Progress Notes (Signed)
Subjective:    Patient ID: Jason [name], male    DOB: 10-Jun-1986, 28 y.o.   MRN: [id number]  HPI  Jason [name] is back regarding his spinal cord infarct. He continues to struggle with pain in his upper and low back. He feels that the back pain is largely muscular in nature than anything else. He feels locked down and tight all the time.   His mother is reporting that he's cold all the time. He has had some problems with UTI for which he was given levaquin. His urine culture was positive for yeast however, not bacteria.    Pain Inventory Average Pain 8 Pain Right Now 8 My pain is constant, sharp, stabbing and aching  In the last 24 hours, has pain interfered with the following? General activity 5 Relation with others 7 Enjoyment of life 8 What TIME of day is your pain at its worst? morning and night Sleep (in general) Poor  Pain is worse with: some activites Pain improves with: medication Relief from Meds: 5  Mobility use a wheelchair  Function disabled: date disabled 01/29/2014  Neuro/Psych bladder control problems bowel control problems weakness numbness tremor spasms depression  Prior Studies Any changes since last visit?  no  Physicians involved in your care Any changes since last visit?  no   Family History  Problem Relation Age of Onset  . Diabetes Father   . Hypertension Father   . Asthma      fhx  . Hypertension      fhx  . Stroke      fhx  . Heart disease Mother    History   Social History  . Marital Status: Single    Spouse Name: N/A    Number of Children: 0  . Years of Education: 11   Occupational History  . Disability    Social History Main Topics  . Smoking status: Former Smoker    Types: Cigars    Quit date: 01/29/2014  . Smokeless tobacco: Never Used     Comment: e-cigarette  . Alcohol Use: No     Comment: rarely  . Drug Use: No  . Sexual Activity: None   Other Topics Concern  . None   Social History Narrative   Patient lives at home with mother father.    Patient has 2 children.    Patient is single.    Patient was left hand but after stroke patient is now right handed.    Patient has  11th grade education.    Past Surgical History  Procedure Laterality Date  . Tonsillectomy     Past Medical History  Diagnosis Date  . GERD (gastroesophageal reflux disease)   . Asthma   . Hx MRSA infection     on face  . Gastroparesis   . Diabetic neuropathy   . Seizures   . Stroke     spinal stroke in 4/15  . Diabetes mellitus     sees Dr. Loanne Drilling    BP 133/89  Pulse 89  Resp 14  SpO2 99%  Opioid Risk Score:   Fall Risk Score: Low Fall Risk (0-5 points)   Review of Systems     Objective:   Physical Exam  HEENT: normal  Cardio: RRR and no murmur  Resp: CTA B/L and unlabored  GI: BS positive and mildly distended and tympanitic, mild tenderness  Extremity: No Edema  Skin: right elbow dressed.  Neuro: Alert/Oriented, Abnormal Sensory absent in feet and Abnormal Motor  0/5 in LLE RLE: tr to 1/5 at knee. ,tr 1/5 finger extension and tr hand intrinsic bilaterally. Wrist extension 4+ to 5/5 right and 3+ on left, 4/5 tricep right, 3- left, biceps 5/5. Decreased sensation below elbows bilaterally, minimal pain/LT at left hand. Poor trunk control.  Posture fair to poor .  Musc/Skel: Generalized upper thoracic and lumbar spine tenderness.  Gen NAD. He has gained weight  Psych: generally alert, appropriate  GU: foley in place.  Assessment/Plan:  1. 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 Chronic Pain Management: maintain MS contin to 60 mg bid  - heating pad for shoulder/neck symptoms, good posture when in bed and chair  -stop robaxin and begin baclofen for muscle spasms---ulimately will titrate to 10mg  bid and 20mg  qhs  -lyrica  100mg  tid and 200mg  qhs  -can continue MS contin 30mg  q12--will need to be vigilant for neurosedation  -change to percocet 10/325 for breakthrough pain, one q8 prn #90   7. DM type 2 with gastroparesis: continues to be brittle---follow up per endocrine/pcp  8. Chronic wounds: gradulal improvement.  9. Constipation/diabetic gastroparesis/neurogenic bowel: reglan. I refilled recnetly 10. Severe diabetic peripheral neuropathy: lyrica  11. Pulmonary: deep breathing, IS/FV, CPAP---was reinforced today.  -quad cough, assistive techniques  -keep hob elevated  -asked mom as above to be very observant of neurosedation with meds  13. CV---midodrine  14. Neurogenic bladder:  -diflucan for yeast  -recheck urine culture today   I will see him back in one month for follow up. Thirty minutes of face to face patient care time were spent during this visit. All questions were encouraged and answered.

## 2014-07-18 ENCOUNTER — Inpatient Hospital Stay (HOSPITAL_COMMUNITY)
Admission: AD | Admit: 2014-07-18 | Discharge: 2014-07-20 | DRG: 698 | Disposition: A | Discharge status: Home / Self Care | Payer: Medicaid Other | Source: Other Acute Inpatient Hospital | Attending: Internal Medicine | Admitting: Internal Medicine

## 2014-07-18 DIAGNOSIS — Z79899 Other long term (current) drug therapy: Secondary | ICD-10-CM | POA: Diagnosis not present

## 2014-07-18 DIAGNOSIS — E1143 Type 2 diabetes mellitus with diabetic autonomic (poly)neuropathy: Secondary | ICD-10-CM | POA: Diagnosis present

## 2014-07-18 DIAGNOSIS — E1065 Type 1 diabetes mellitus with hyperglycemia: Secondary | ICD-10-CM | POA: Diagnosis present

## 2014-07-18 DIAGNOSIS — I959 Hypotension, unspecified: Secondary | ICD-10-CM | POA: Diagnosis present

## 2014-07-18 DIAGNOSIS — Z87891 Personal history of nicotine dependence: Secondary | ICD-10-CM | POA: Diagnosis not present

## 2014-07-18 DIAGNOSIS — I9589 Other hypotension: Secondary | ICD-10-CM

## 2014-07-18 DIAGNOSIS — N390 Urinary tract infection, site not specified: Secondary | ICD-10-CM | POA: Diagnosis present

## 2014-07-18 DIAGNOSIS — Y738 Miscellaneous gastroenterology and urology devices associated with adverse incidents, not elsewhere classified: Secondary | ICD-10-CM | POA: Diagnosis not present

## 2014-07-18 DIAGNOSIS — T8351XA Infection and inflammatory reaction due to indwelling urinary catheter, initial encounter: Principal | ICD-10-CM | POA: Diagnosis present

## 2014-07-18 DIAGNOSIS — T428X1A Poisoning by antiparkinsonism drugs and other central muscle-tone depressants, accidental (unintentional), initial encounter: Secondary | ICD-10-CM | POA: Diagnosis present

## 2014-07-18 DIAGNOSIS — G9511 Acute infarction of spinal cord (embolic) (nonembolic): Secondary | ICD-10-CM

## 2014-07-18 DIAGNOSIS — B3741 Candidal cystitis and urethritis: Secondary | ICD-10-CM | POA: Diagnosis not present

## 2014-07-18 DIAGNOSIS — G9340 Encephalopathy, unspecified: Secondary | ICD-10-CM | POA: Diagnosis present

## 2014-07-18 DIAGNOSIS — K219 Gastro-esophageal reflux disease without esophagitis: Secondary | ICD-10-CM | POA: Diagnosis not present

## 2014-07-18 DIAGNOSIS — IMO0002 Reserved for concepts with insufficient information to code with codable children: Secondary | ICD-10-CM | POA: Diagnosis present

## 2014-07-18 DIAGNOSIS — E1041 Type 1 diabetes mellitus with diabetic mononeuropathy: Secondary | ICD-10-CM

## 2014-07-18 DIAGNOSIS — Z794 Long term (current) use of insulin: Secondary | ICD-10-CM | POA: Diagnosis not present

## 2014-07-18 DIAGNOSIS — G8250 Quadriplegia, unspecified: Secondary | ICD-10-CM | POA: Diagnosis not present

## 2014-07-18 DIAGNOSIS — K2100 Gastro-esophageal reflux disease with esophagitis, without bleeding: Secondary | ICD-10-CM

## 2014-07-18 DIAGNOSIS — N10 Acute tubulo-interstitial nephritis: Secondary | ICD-10-CM

## 2014-07-18 DIAGNOSIS — E1040 Type 1 diabetes mellitus with diabetic neuropathy, unspecified: Secondary | ICD-10-CM

## 2014-07-18 DIAGNOSIS — E1165 Type 2 diabetes mellitus with hyperglycemia: Secondary | ICD-10-CM

## 2014-07-18 DIAGNOSIS — E1043 Type 1 diabetes mellitus with diabetic autonomic (poly)neuropathy: Secondary | ICD-10-CM | POA: Diagnosis not present

## 2014-07-18 DIAGNOSIS — K3184 Gastroparesis: Secondary | ICD-10-CM | POA: Diagnosis not present

## 2014-07-18 DIAGNOSIS — Z880 Allergy status to penicillin: Secondary | ICD-10-CM | POA: Diagnosis not present

## 2014-07-18 DIAGNOSIS — Z8614 Personal history of Methicillin resistant Staphylococcus aureus infection: Secondary | ICD-10-CM | POA: Diagnosis not present

## 2014-07-18 DIAGNOSIS — J45909 Unspecified asthma, uncomplicated: Secondary | ICD-10-CM | POA: Diagnosis not present

## 2014-07-18 DIAGNOSIS — G92 Toxic encephalopathy: Secondary | ICD-10-CM | POA: Diagnosis not present

## 2014-07-18 DIAGNOSIS — J4520 Mild intermittent asthma, uncomplicated: Secondary | ICD-10-CM

## 2014-07-18 DIAGNOSIS — E785 Hyperlipidemia, unspecified: Secondary | ICD-10-CM

## 2014-07-18 DIAGNOSIS — R4182 Altered mental status, unspecified: Secondary | ICD-10-CM

## 2014-07-18 DIAGNOSIS — T83511A Infection and inflammatory reaction due to indwelling urethral catheter, initial encounter: Secondary | ICD-10-CM

## 2014-07-18 LAB — CBC WITH DIFFERENTIAL/PLATELET
Basophils Absolute: 0.1 10*3/uL (ref 0.0–0.1)
Basophils Relative: 0 % (ref 0–1)
Eosinophils Absolute: 0.2 10*3/uL (ref 0.0–0.7)
Eosinophils Relative: 1 % (ref 0–5)
HCT: 36.4 % — ABNORMAL LOW (ref 39.0–52.0)
HEMOGLOBIN: 11.9 g/dL — AB (ref 13.0–17.0)
LYMPHS PCT: 21 % (ref 12–46)
Lymphs Abs: 4 10*3/uL (ref 0.7–4.0)
MCH: 25.3 pg — ABNORMAL LOW (ref 26.0–34.0)
MCHC: 32.7 g/dL (ref 30.0–36.0)
MCV: 77.4 fL — ABNORMAL LOW (ref 78.0–100.0)
MONOS PCT: 11 % (ref 3–12)
Monocytes Absolute: 2.1 10*3/uL — ABNORMAL HIGH (ref 0.1–1.0)
Neutro Abs: 12.8 10*3/uL — ABNORMAL HIGH (ref 1.7–7.7)
Neutrophils Relative %: 67 % (ref 43–77)
PLATELETS: 450 10*3/uL — AB (ref 150–400)
RBC: 4.7 MIL/uL (ref 4.22–5.81)
RDW: 16.4 % — ABNORMAL HIGH (ref 11.5–15.5)
WBC: 19.2 10*3/uL — AB (ref 4.0–10.5)

## 2014-07-18 LAB — COMPREHENSIVE METABOLIC PANEL
ALBUMIN: 3 g/dL — AB (ref 3.5–5.2)
ALT: 41 U/L (ref 0–53)
AST: 26 U/L (ref 0–37)
Alkaline Phosphatase: 184 U/L — ABNORMAL HIGH (ref 39–117)
Anion gap: 16 — ABNORMAL HIGH (ref 5–15)
BUN: 23 mg/dL (ref 6–23)
CALCIUM: 9.8 mg/dL (ref 8.4–10.5)
CO2: 26 mEq/L (ref 19–32)
CREATININE: 1.18 mg/dL (ref 0.50–1.35)
Chloride: 100 mEq/L (ref 96–112)
GFR calc Af Amer: >90 mL/min (ref >90)
GFR, EST NON AFRICAN AMERICAN: 82 mL/min — AB (ref >90)
Glucose, Bld: 171 mg/dL — ABNORMAL HIGH (ref 70–99)
Potassium: 4.1 mEq/L (ref 3.7–5.3)
SODIUM: 142 meq/L (ref 137–147)
Total Bilirubin: 0.4 mg/dL (ref 0.3–1.2)
Total Protein: 8.4 g/dL — ABNORMAL HIGH (ref 6.0–8.3)

## 2014-07-18 LAB — MRSA PCR SCREENING: MRSA BY PCR: NEGATIVE

## 2014-07-18 LAB — URINE CULTURE: Colony Count: >=100000

## 2014-07-18 MED ORDER — GLUCAGON (RDNA) 1 MG IJ KIT
1.0000 mg | PACK | Freq: Once | INTRAMUSCULAR | Status: AC | PRN
  Filled 2014-07-18: qty 1

## 2014-07-18 MED ORDER — INSULIN ASPART 100 UNIT/ML ~~LOC~~ SOLN
0.0000 [IU] | Freq: Three times a day (TID) | SUBCUTANEOUS | Status: DC
  Administered 2014-07-19 (×2): 3 [IU] via SUBCUTANEOUS
  Administered 2014-07-19 – 2014-07-20 (×2): 2 [IU] via SUBCUTANEOUS

## 2014-07-18 MED ORDER — ACETAMINOPHEN 325 MG PO TABS: 650.0000 mg | ORAL_TABLET | Freq: Four times a day (QID) | ORAL | Status: DC | PRN

## 2014-07-18 MED ORDER — ALBUTEROL SULFATE (2.5 MG/3ML) 0.083% IN NEBU: 2.5000 mg | INHALATION_SOLUTION | RESPIRATORY_TRACT | Status: DC | PRN

## 2014-07-18 MED ORDER — FLUCONAZOLE 100 MG PO TABS
100.0000 mg | ORAL_TABLET | Freq: Every day | ORAL | Status: DC
  Administered 2014-07-19 – 2014-07-20 (×2): 100 mg via ORAL
  Filled 2014-07-18 (×2): qty 1

## 2014-07-18 MED ORDER — LORATADINE 10 MG PO TABS
10.0000 mg | ORAL_TABLET | Freq: Two times a day (BID) | ORAL | Status: DC | PRN
  Filled 2014-07-18: qty 1

## 2014-07-18 MED ORDER — METOCLOPRAMIDE HCL 5 MG PO TABS
5.0000 mg | ORAL_TABLET | ORAL | Status: DC
  Administered 2014-07-19: 5 mg via ORAL
  Filled 2014-07-18: qty 1

## 2014-07-18 MED ORDER — LIDOCAINE 5 % EX OINT
1.0000 "application " | TOPICAL_OINTMENT | Freq: Three times a day (TID) | CUTANEOUS | Status: DC | PRN
  Filled 2014-07-18: qty 35.44

## 2014-07-18 MED ORDER — DIPHENOXYLATE-ATROPINE 2.5-0.025 MG PO TABS
1.0000 | ORAL_TABLET | Freq: Two times a day (BID) | ORAL | Status: DC
  Administered 2014-07-18 – 2014-07-20 (×3): 1 via ORAL
  Filled 2014-07-18 (×3): qty 1

## 2014-07-18 MED ORDER — MIDODRINE HCL 5 MG PO TABS
5.0000 mg | ORAL_TABLET | Freq: Two times a day (BID) | ORAL | Status: DC | PRN
  Filled 2014-07-18: qty 1

## 2014-07-18 MED ORDER — SILVER SULFADIAZINE 1 % EX CREA
1.0000 "application " | TOPICAL_CREAM | CUTANEOUS | Status: DC
  Filled 2014-07-18: qty 85

## 2014-07-18 MED ORDER — HEPARIN SODIUM (PORCINE) 5000 UNIT/ML IJ SOLN
5000.0000 [IU] | Freq: Three times a day (TID) | INTRAMUSCULAR | Status: DC
  Administered 2014-07-18 – 2014-07-19 (×4): 5000 [IU] via SUBCUTANEOUS
  Filled 2014-07-18 (×7): qty 1

## 2014-07-18 MED ORDER — ONDANSETRON HCL 4 MG PO TABS: 8.0000 mg | ORAL_TABLET | Freq: Two times a day (BID) | ORAL | Status: DC | PRN

## 2014-07-18 MED ORDER — INSULIN GLARGINE 100 UNIT/ML ~~LOC~~ SOLN
15.0000 [IU] | Freq: Every day | SUBCUTANEOUS | Status: DC
  Administered 2014-07-18: 15 [IU] via SUBCUTANEOUS
  Filled 2014-07-18 (×2): qty 0.15

## 2014-07-18 MED ORDER — PANTOPRAZOLE SODIUM 40 MG PO TBEC
40.0000 mg | DELAYED_RELEASE_TABLET | Freq: Every day | ORAL | Status: DC
  Administered 2014-07-19 – 2014-07-20 (×2): 40 mg via ORAL
  Filled 2014-07-18 (×2): qty 1

## 2014-07-18 MED ORDER — OXYCODONE HCL 5 MG PO TABS
15.0000 mg | ORAL_TABLET | Freq: Two times a day (BID) | ORAL | Status: DC | PRN
  Administered 2014-07-18 – 2014-07-19 (×2): 15 mg via ORAL
  Filled 2014-07-18 (×2): qty 3

## 2014-07-18 MED ORDER — DICLOFENAC SODIUM 1 % TD GEL
2.0000 g | Freq: Every day | TRANSDERMAL | Status: DC | PRN
  Filled 2014-07-18: qty 100

## 2014-07-18 MED ORDER — MORPHINE SULFATE ER 15 MG PO TBCR
30.0000 mg | EXTENDED_RELEASE_TABLET | Freq: Two times a day (BID) | ORAL | Status: DC
  Administered 2014-07-18 – 2014-07-20 (×4): 30 mg via ORAL
  Filled 2014-07-18 (×4): qty 2

## 2014-07-18 MED ORDER — CIPROFLOXACIN IN D5W 400 MG/200ML IV SOLN
400.0000 mg | Freq: Two times a day (BID) | INTRAVENOUS | Status: DC
  Administered 2014-07-18 – 2014-07-19 (×2): 400 mg via INTRAVENOUS
  Filled 2014-07-18 (×3): qty 200

## 2014-07-18 MED ORDER — SODIUM CHLORIDE 0.9 % IJ SOLN
3.0000 mL | Freq: Two times a day (BID) | INTRAMUSCULAR | Status: DC
  Administered 2014-07-19: 3 mL via INTRAVENOUS

## 2014-07-18 NOTE — Progress Notes (Signed)
ANTIBIOTIC CONSULT NOTE - INITIAL  Pharmacy Consult for Ciprofloxacin Indication: UTI  Allergies  Allergen Reactions  . Cefuroxime Axetil Anaphylaxis  . Penicillins Anaphylaxis    ?can take amoxicillin?  [name] [Benzonatate] Anaphylaxis  . Shellfish Allergy Itching    Patient Measurements: Weight: 132 lb 15 oz (60.3 kg)  Vital Signs: Temp: 98.5 F (36.9 C) (10/11 2008) Temp Source: Oral (10/11 2008) BP: 149/95 mmHg (10/11 2008) Pulse Rate: 88 (10/11 2008) Intake/Output from previous day:   Intake/Output from this shift:    Labs: No results found for this basename: WBC, HGB, PLT, LABCREA, CREATININE,  in the last 72 hours The CrCl is unknown because both a height and weight (above a minimum accepted value) are required for this calculation. No results found for this basename: VANCOTROUGH, Corlis Leak, VANCORANDOM, [place], [place], [place], [place], TOBRAPEAK, TOBRARND, AMIKACINPEAK, AMIKACINTROU, AMIKACIN,  in the last 72 hours   Microbiology: Recent Results (from the past 720 hour(s))  URINE CULTURE     Status: None   Collection Time    06/28/14  2:00 PM      Result Value Ref Range Status   Colony Count >=100,000 COLONIES/ML   Final   Organism ID, Bacteria Yeast   Final  URINE CULTURE     Status: None   Collection Time    07/16/14 12:19 PM      Result Value Ref Range Status   Colony Count >=100,000 COLONIES/ML   Final   Organism ID, Bacteria Yeast   Final    Medical History: Past Medical History  Diagnosis Date  . GERD (gastroesophageal reflux disease)   . Asthma   . Hx MRSA infection     on face  . Gastroparesis   . Diabetic neuropathy   . Seizures   . Stroke     spinal stroke in 4/15  . Diabetes mellitus     sees Dr. Loanne Drilling     Medications:  Prescriptions prior to admission  Medication Sig Dispense Refill  . acetaminophen (TYLENOL) 325 MG tablet Take 2 tablets (650 mg total) by mouth every 6 (six) hours as needed for mild pain (or  Fever >/= 101).      Marland Kitchen albuterol (PROVENTIL) (2.5 MG/3ML) 0.083% nebulizer solution Take 3 mLs (2.5 mg total) by nebulization every 4 (four) hours as needed for wheezing or shortness of breath.  75 mL  3  . baclofen (LIORESAL) 10 MG tablet Take 1 tablet (10 mg total) by mouth as directed. Take one twice daily and two at bed time  120 tablet  3  . dexlansoprazole (DEXILANT) 60 MG capsule Take 60 mg by mouth daily.      . diclofenac sodium (VOLTAREN) 1 % GEL Apply 2 g topically daily as needed (for pain).      Marland Kitchen diphenoxylate-atropine (LOMOTIL) 2.5-0.025 MG per tablet Take 1 tablet by mouth 2 (two) times daily.      . fluconazole (DIFLUCAN) 100 MG tablet Take 1 tablet (100 mg total) by mouth daily.  10 tablet  1  . furosemide (LASIX) 40 MG tablet Take 1 tablet (40 mg total) by mouth daily.  90 tablet  3  . glucagon (GLUCAGON EMERGENCY) 1 MG injection Inject 1 mg into the vein once as needed.  1 each  12  . glucose blood (ACCU-CHEK SMARTVIEW) test strip 1 each by Other route 4 (four) times daily. And lancets 4/day 250.03.  variable cbg's  120 each  12  . insulin aspart (NOVOLOG) 100 UNIT/ML injection  Inject 5 Units into the skin 3 (three) times daily with meals.      . insulin glargine (LANTUS) 100 UNIT/ML injection Inject 20 Units into the skin at bedtime.       Marland Kitchen levofloxacin (LEVAQUIN) 500 MG tablet Take 1 tablet (500 mg total) by mouth daily.  10 tablet  0  . lidocaine (XYLOCAINE) 5 % ointment Apply 1 application topically 3 (three) times daily as needed.  35.44 g  4  . loratadine (CLARITIN) 10 MG tablet Take 10 mg by mouth 2 (two) times daily as needed for allergies.      Marland Kitchen metoCLOPramide (REGLAN) 5 MG tablet Take 1 tablet (5 mg total) by mouth 2 (two) times a week. And as needed for nausea  8 tablet  3  . midodrine (PROAMATINE) 5 MG tablet Take 5 mg by mouth 2 (two) times daily as needed (for blood pressure).      . morphine (MS CONTIN) 30 MG 12 hr tablet Take 1 tablet (30 mg total) by mouth  every 12 (twelve) hours.  60 tablet  0  . ondansetron (ZOFRAN) 8 MG tablet Take 1 tablet (8 mg total) by mouth every 12 (twelve) hours as needed for nausea.  60 tablet  5  . oxyCODONE (ROXICODONE) 15 MG immediate release tablet Take 1 tablet (15 mg total) by mouth every 12 (twelve) hours as needed for pain.  60 tablet  0  . oxyCODONE-acetaminophen (PERCOCET) 10-325 MG per tablet Take 1 tablet by mouth every 8 (eight) hours as needed for pain.  90 tablet  0  . pregabalin (LYRICA) 100 MG capsule Take 100-200 mg by mouth 4 (four) times daily. Take 100 mg by mouth in the morning, take 100 mg by mouth in the afternoon, take 100 mg by mouth in the evening and take 200 mg by mouth at bedtime.      . silver sulfADIAZINE (SILVADENE) 1 % cream Apply 1 application topically 2 (two) times a week. Tuesday and thursday       Assessment: 28 y.o. male to start ciprofloxacin for UTI. Noted pt on levaquin PTA. Pt with cephalosporin and PCN anaphylaxis allergies. Baseline Scr not done yet. Last SCr 0.72 on 05/28/14 (although may not be reflective of renal function in incomplete tetraplegic pt).  Goal of Therapy:  Resolution of infection  Plan:  1. Ciprofloxacin 400mg  IV q12h 2. Will f/u micro data, pt's clinical condition, and renal function.  Sherlon Handing, PharmD, BCPS Clinical pharmacist, pager [phone] 07/18/2014,8:26 PM

## 2014-07-18 NOTE — H&P (Addendum)
[facility] History and Physical  Jason [name] ZYS:[id number] DOB: 07-20-1986 DOA: 07/18/2014  Referring physician: ED physician PCP: [name], MD  Specialists:   Chief Complaint: AMS  HPI: Jason [name] is a 28 y.o. male with history of diabetes mellitus1, spinal cord infarct with tetraplegia, gastroparesis, asthma, who presents with AMS.   Due to the dwelling catheter, patient has recurrent UTI. He was recently treated for another episode of UTI with Levaquin. After that treatment, he developed bladder yeast infection. He was started with Diflucan for yeast infection on 07/16/14. He is also taking several pain or sedative medications, including baclofen, metoclopramide, MS Contin, Zofran, oxycodone, Lyrica for his chronic pain. Because of worsening leg pain, he took double doses of baclofen last night. After that, he became lethargic, which has been progressively worsening. Then he was brought to the [facility]. Patient was found to have leukocytosis with WBC 19.0, urinary tract infection with positive UA. After treated with IV fluids and started with Rocephin, patient is transferred to [name] for further evaluation and treatment.  Of note, patient's mother reports that he had urinary retention and severe abdominal distention yesterday, which had completely resolved after changing to a new catheter and retrieved 2200 ml of urine.  At arrival to the SDU floor, patient's mental status recovered back to his baseline. When I evaluated the patient, he is oriented x3, answered all questions appropriately. He could not remember why he was brought [facility]. He denies fever, chills, cough, chest pain, shortness of breath, nausea, vomiting, abdominal pain, diarrhea. He could not tell whether he has symptoms for UTI. He still has moderate pain over his bilateral legs, back and shoulders, which are his chronic issues without acute change in nature.   Review  of Systems: As presented in the history of presenting illness, rest negative.  Where does patient live?  Lives with his parents Can patient participate in ADLs? no  Allergy:  Allergies  Allergen Reactions  . Cefuroxime Axetil Anaphylaxis  . Penicillins Anaphylaxis    ?can take amoxicillin?  [name] [Benzonatate] Anaphylaxis  . Shellfish Allergy Itching    Past Medical History  Diagnosis Date  . GERD (gastroesophageal reflux disease)   . Asthma   . Hx MRSA infection     on face  . Gastroparesis   . Diabetic neuropathy   . Seizures   . Stroke     spinal stroke in 4/15  . Diabetes mellitus     sees Dr. Loanne Drilling     Past Surgical History  Procedure Laterality Date  . Tonsillectomy      Social History:  reports that he quit smoking about 5 months ago. His smoking use included Cigars. He has never used smokeless tobacco. He reports that he does not drink alcohol or use illicit drugs.  Family History:  Family History  Problem Relation Age of Onset  . Diabetes Father   . Hypertension Father   . Asthma      fhx  . Hypertension      fhx  . Stroke      fhx  . Heart disease Mother      Prior to Admission medications   Medication Sig Start Date End Date Taking? Authorizing Provider  acetaminophen (TYLENOL) 325 MG tablet Take 2 tablets (650 mg total) by mouth every 6 (six) hours as needed for mild pain (or Fever >/= 101). 05/28/14   Delfina Redwood, MD  albuterol (PROVENTIL) (2.5  MG/3ML) 0.083% nebulizer solution Take 3 mLs (2.5 mg total) by nebulization every 4 (four) hours as needed for wheezing or shortness of breath. 04/27/14   [name], MD  baclofen (LIORESAL) 10 MG tablet Take 1 tablet (10 mg total) by mouth as directed. Take one twice daily and two at bed time 07/16/14   Meredith Staggers, MD  dexlansoprazole (DEXILANT) 60 MG capsule Take 60 mg by mouth daily.    Historical Provider, MD  diclofenac sodium (VOLTAREN) 1 % GEL Apply 2 g topically daily as needed  (for pain).    Historical Provider, MD  diphenoxylate-atropine (LOMOTIL) 2.5-0.025 MG per tablet Take 1 tablet by mouth 2 (two) times daily.    Historical Provider, MD  fluconazole (DIFLUCAN) 100 MG tablet Take 1 tablet (100 mg total) by mouth daily. 07/16/14   Meredith Staggers, MD  furosemide (LASIX) 40 MG tablet Take 1 tablet (40 mg total) by mouth daily. 07/15/14   [name], MD  glucagon (GLUCAGON EMERGENCY) 1 MG injection Inject 1 mg into the vein once as needed. 04/20/14   [name], MD  glucose blood (ACCU-CHEK SMARTVIEW) test strip 1 each by Other route 4 (four) times daily. And lancets 4/day 250.03.  variable cbg's 06/09/14   Renato Shin, MD  insulin aspart (NOVOLOG) 100 UNIT/ML injection Inject 5 Units into the skin 3 (three) times daily with meals. 05/28/14   Delfina Redwood, MD  insulin glargine (LANTUS) 100 UNIT/ML injection Inject 20 Units into the skin at bedtime.     Historical Provider, MD  levofloxacin (LEVAQUIN) 500 MG tablet Take 1 tablet (500 mg total) by mouth daily. 07/15/14   [name], MD  lidocaine (XYLOCAINE) 5 % ointment Apply 1 application topically 3 (three) times daily as needed. 06/16/14   Meredith Staggers, MD  loratadine (CLARITIN) 10 MG tablet Take 10 mg by mouth 2 (two) times daily as needed for allergies.    Historical Provider, MD  metoCLOPramide (REGLAN) 5 MG tablet Take 1 tablet (5 mg total) by mouth 2 (two) times a week. And as needed for nausea 07/01/14   Meredith Staggers, MD  midodrine (PROAMATINE) 5 MG tablet Take 5 mg by mouth 2 (two) times daily as needed (for blood pressure).    Historical Provider, MD  morphine (MS CONTIN) 30 MG 12 hr tablet Take 1 tablet (30 mg total) by mouth every 12 (twelve) hours. 07/16/14   Meredith Staggers, MD  ondansetron (ZOFRAN) 8 MG tablet Take 1 tablet (8 mg total) by mouth every 12 (twelve) hours as needed for nausea. 04/20/14   [name], MD  oxyCODONE (ROXICODONE) 15 MG immediate release tablet Take 1 tablet (15 mg  total) by mouth every 12 (twelve) hours as needed for pain. 06/30/14   Danella Sensing, NP  oxyCODONE-acetaminophen (PERCOCET) 10-325 MG per tablet Take 1 tablet by mouth every 8 (eight) hours as needed for pain. 07/16/14   Meredith Staggers, MD  pregabalin (LYRICA) 100 MG capsule Take 100-200 mg by mouth 4 (four) times daily. Take 100 mg by mouth in the morning, take 100 mg by mouth in the afternoon, take 100 mg by mouth in the evening and take 200 mg by mouth at bedtime.    Historical Provider, MD  silver sulfADIAZINE (SILVADENE) 1 % cream Apply 1 application topically 2 (two) times a week. Tuesday and thursday    Historical Provider, MD    Physical Exam: Filed Vitals:   07/18/14  2008  BP: 149/95  Pulse: 88  Temp: 98.5 F (36.9 C)  TempSrc: Oral  Resp: 18  Weight: 60.3 kg (132 lb 15 oz)  SpO2: 100%   General: Not in acute distress. Dry mucous and membrane.  HEENT:       Eyes: PERRL, EOMI, no scleral icterus       ENT: No discharge from the ears and nose, no pharynx injection, no tonsillar enlargement.        Neck: No JVD, no bruit, no mass felt. Cardiac: S1/S2, RRR, No murmurs, gallops or rubs Pulm: Good air movement bilaterally. Clear to auscultation bilaterally. No rales, wheezing, rhonchi or rubs. Abd: Soft, nondistended, nontender, no rebound pain, no organomegaly, BS present Ext: No edema. 2+DP/PT pulse bilaterally Musculoskeletal: Generalized upper thoracic and lumbar spine tenderness.   Skin: tiny skin tears over his left front leg and right lateral thigh, but not infected. Neuro: Alert/Oriented X3, cranial nerves 2-12 grossly normal. Patient has tetraplegia. Psych: Patient is not psychotic, no suicidal or hemocidal ideation. GU: foley in place.    Labs on Admission:  Basic Metabolic Panel: No results found for this basename: NA, K, CL, CO2, GLUCOSE, BUN, CREATININE, CALCIUM, MG, PHOS,  in the last 168 hours Liver Function Tests: No results found for this basename: AST, ALT,  ALKPHOS, BILITOT, PROT, ALBUMIN,  in the last 168 hours No results found for this basename: LIPASE, AMYLASE,  in the last 168 hours No results found for this basename: AMMONIA,  in the last 168 hours CBC: No results found for this basename: WBC, NEUTROABS, HGB, HCT, MCV, PLT,  in the last 168 hours Cardiac Enzymes: No results found for this basename: CKTOTAL, CKMB, CKMBINDEX, TROPONINI,  in the last 168 hours  BNP (last 3 results)  Recent Labs  09/28/13 0430 10/02/13 0445 01/29/14 2113  PROBNP 673.0* 2451.0* 1343.0*   CBG: No results found for this basename: GLUCAP,  in the last 168 hours  Radiological Exams on Admission: No results found.  EKG: Independently reviewed.   Assessment/Plan Principal Problem:   Acute encephalopathy Active Problems:   Type 1 diabetes, uncontrolled, with neuropathy   Asthma   GERD   Spinal cord infarction (history of)   Tetraplegia   UTI (urinary tract infection)  #: Acute encephalopathy: Most likely due to polypharmacy. Patient was recently started with Diflucan, which may have decreased the metabolism of many of his drugs, and increased the blood concentrations. In addition, patient took double dose of baclofen yesterday. Urinary tract infection may have also contributed. Currently patient's mental status is back to his baseline.   - Continue to observe patient in SUD - blood culture X2 - treat UTI and yeast infection - follow up urine culture - limit his sedative and pain meds tonight: Hold the baclofen, Percocet, Lyrica. - hold lasix tonight due to not having taken food and mildly dehydrated.  #: UTI: - continue Diflucan for yeast infection - switch rocephin to cipro due to anaphylaxis reaction to penicillin - follow up urine culture  # Diabetes mellitus type 2 with gastroparesis: last A1c was 9.0. On lantus 20 U daily at home. - will decrease his lantus to 15 U due to decreased oral intake - SSI - Continue Reglan  #: Spinal cord  infarct with tetraplegia: continue MS Contin and when necessary oxycodone for pain.   DVT ppx: SQ Heparin    Code Status: Full code Family Communication: Yes, patient's parents at bed side Disposition Plan: Admit to inpatient  Date of Service 07/18/2014    Ivor Costa [facility] Pager [phone]  If 7PM-7AM, please contact night-coverage [email or web address] Password [facility] 07/18/2014, 8:54 PM

## 2014-07-19 ENCOUNTER — Encounter (HOSPITAL_COMMUNITY): Payer: Self-pay

## 2014-07-19 ENCOUNTER — Telehealth: Payer: Self-pay | Admitting: Physical Medicine & Rehabilitation

## 2014-07-19 DIAGNOSIS — G9340 Encephalopathy, unspecified: Secondary | ICD-10-CM

## 2014-07-19 LAB — COMPREHENSIVE METABOLIC PANEL
ALBUMIN: 2.6 g/dL — AB (ref 3.5–5.2)
ALK PHOS: 153 U/L — AB (ref 39–117)
ALT: 33 U/L (ref 0–53)
AST: 23 U/L (ref 0–37)
Anion gap: 14 (ref 5–15)
BUN: 21 mg/dL (ref 6–23)
CHLORIDE: 99 meq/L (ref 96–112)
CO2: 25 mEq/L (ref 19–32)
Calcium: 9.3 mg/dL (ref 8.4–10.5)
Creatinine, Ser: 1 mg/dL (ref 0.50–1.35)
GFR calc Af Amer: >90 mL/min (ref >90)
GFR calc non Af Amer: >90 mL/min (ref >90)
Glucose, Bld: 286 mg/dL — ABNORMAL HIGH (ref 70–99)
Potassium: 4 mEq/L (ref 3.7–5.3)
SODIUM: 138 meq/L (ref 137–147)
TOTAL PROTEIN: 7.1 g/dL (ref 6.0–8.3)
Total Bilirubin: 0.3 mg/dL (ref 0.3–1.2)

## 2014-07-19 LAB — GLUCOSE, CAPILLARY
GLUCOSE-CAPILLARY: 178 mg/dL — AB (ref 70–99)
GLUCOSE-CAPILLARY: 237 mg/dL — AB (ref 70–99)
GLUCOSE-CAPILLARY: 199 mg/dL — AB (ref 70–99)
GLUCOSE-CAPILLARY: 237 mg/dL — AB (ref 70–99)
Glucose-Capillary: 238 mg/dL — ABNORMAL HIGH (ref 70–99)
Glucose-Capillary: 274 mg/dL — ABNORMAL HIGH (ref 70–99)
Glucose-Capillary: 92 mg/dL (ref 70–99)

## 2014-07-19 LAB — CBC
HEMATOCRIT: 27.7 % — AB (ref 39.0–52.0)
HEMOGLOBIN: 9 g/dL — AB (ref 13.0–17.0)
MCH: 25.1 pg — ABNORMAL LOW (ref 26.0–34.0)
MCHC: 32.1 g/dL (ref 30.0–36.0)
MCV: 78.2 fL (ref 78.0–100.0)
Platelets: 415 10*3/uL — ABNORMAL HIGH (ref 150–400)
RBC: 3.54 MIL/uL — ABNORMAL LOW (ref 4.22–5.81)
RDW: 16.4 % — AB (ref 11.5–15.5)
WBC: 17.7 10*3/uL — ABNORMAL HIGH (ref 4.0–10.5)

## 2014-07-19 MED ORDER — OXYCODONE-ACETAMINOPHEN 10-325 MG PO TABS: 1.0000 | ORAL_TABLET | Freq: Three times a day (TID) | ORAL | Status: DC | PRN

## 2014-07-19 MED ORDER — OXYCODONE-ACETAMINOPHEN 5-325 MG PO TABS
1.0000 | ORAL_TABLET | Freq: Three times a day (TID) | ORAL | Status: DC | PRN
  Administered 2014-07-19: 1 via ORAL
  Filled 2014-07-19: qty 1

## 2014-07-19 MED ORDER — BACLOFEN 10 MG PO TABS
10.0000 mg | ORAL_TABLET | Freq: Two times a day (BID) | ORAL | Status: DC
  Administered 2014-07-20 (×2): 10 mg via ORAL
  Filled 2014-07-19 (×3): qty 1

## 2014-07-19 MED ORDER — INSULIN ASPART 100 UNIT/ML ~~LOC~~ SOLN
5.0000 [IU] | Freq: Three times a day (TID) | SUBCUTANEOUS | Status: DC
  Administered 2014-07-20: 5 [IU] via SUBCUTANEOUS

## 2014-07-19 MED ORDER — PREGABALIN 75 MG PO CAPS: 100.0000 mg | ORAL_CAPSULE | Freq: Three times a day (TID) | ORAL | Status: DC

## 2014-07-19 MED ORDER — INFLUENZA VAC SPLIT QUAD 0.5 ML IM SUSY
0.5000 mL | PREFILLED_SYRINGE | INTRAMUSCULAR | Status: AC
  Administered 2014-07-20: 0.5 mL via INTRAMUSCULAR
  Filled 2014-07-19: qty 0.5

## 2014-07-19 MED ORDER — GLUCERNA SHAKE PO LIQD
237.0000 mL | Freq: Three times a day (TID) | ORAL | Status: DC
  Administered 2014-07-19 – 2014-07-20 (×3): 237 mL via ORAL

## 2014-07-19 MED ORDER — INSULIN GLARGINE 100 UNIT/ML ~~LOC~~ SOLN: 15.0000 [IU] | Freq: Every day | SUBCUTANEOUS | Status: DC

## 2014-07-19 MED ORDER — OXYCODONE HCL 5 MG PO TABS
5.0000 mg | ORAL_TABLET | Freq: Three times a day (TID) | ORAL | Status: DC | PRN
  Administered 2014-07-20: 5 mg via ORAL
  Filled 2014-07-19: qty 1

## 2014-07-19 MED ORDER — PREGABALIN 100 MG PO CAPS: 100.0000 mg | ORAL_CAPSULE | Freq: Four times a day (QID) | ORAL | Status: DC

## 2014-07-19 MED ORDER — BACLOFEN 20 MG PO TABS
20.0000 mg | ORAL_TABLET | Freq: Every day | ORAL | Status: DC
  Administered 2014-07-19: 20 mg via ORAL
  Filled 2014-07-19 (×2): qty 1

## 2014-07-19 MED ORDER — INSULIN GLARGINE 100 UNIT/ML ~~LOC~~ SOLN
20.0000 [IU] | Freq: Every day | SUBCUTANEOUS | Status: DC
  Administered 2014-07-19: 20 [IU] via SUBCUTANEOUS
  Filled 2014-07-19 (×2): qty 0.2

## 2014-07-19 MED ORDER — PREGABALIN 25 MG PO CAPS
200.0000 mg | ORAL_CAPSULE | Freq: Every day | ORAL | Status: DC
  Administered 2014-07-19: 200 mg via ORAL
  Filled 2014-07-19 (×2): qty 2

## 2014-07-19 MED ORDER — OXYCODONE HCL 5 MG PO TABS
5.0000 mg | ORAL_TABLET | Freq: Once | ORAL | Status: AC
  Administered 2014-07-19: 5 mg via ORAL
  Filled 2014-07-19: qty 1

## 2014-07-19 MED ORDER — BACLOFEN 10 MG PO TABS: 10.0000 mg | ORAL_TABLET | Freq: Three times a day (TID) | ORAL | Status: DC

## 2014-07-19 NOTE — Progress Notes (Signed)
Inpatient Diabetes Program Recommendations  AACE/ADA: New Consensus Statement on Inpatient Glycemic Control (2013)  Target Ranges:  Prepandial:   less than 140 mg/dL      Peak postprandial:   less than 180 mg/dL (1-2 hours)      Critically ill patients:  140 - 180 mg/dL  Results for KEYMARION, BEARMAN (MRN [id number]) as of 07/19/2014 10:15  Ref. Range 07/19/2014 04:13  Glucose Latest Range: 70-99 mg/dL 286 (H)   Results for ABRAHAM, ENTWISTLE (MRN [id number]) as of 07/19/2014 10:15  Ref. Range 07/18/2014 19:33 07/19/2014 00:23  Glucose-Capillary Latest Range: 70-99 mg/dL 92 274 (H)   Diabetes history: DM1 Outpatient Diabetes medications: Lantus 20 units QHS, Novolog 5 units TID with meals plus Novolog 2 units if CBG > 300 mg/dl Current orders for Inpatient glycemic control: Lantus 15 units QHS, Novolog 0-9 units TID with meals  Inpatient Diabetes Program Recommendations Insulin - Basal: Please consider increasing Lantus to 18 units QHS. Correction (SSI): Please order Novolog bedtime correction scale. Insulin - Meal Coverage: Please consider ordering Novolog 3 units TID with meals for meal  coverage.  Note: In reviewing the chart noted patient is followed by Dr. Loanne Drilling and last saw him on 06/09/14 at which time Lantus was decreased to 20 units QHS and Novolog 5 units TID with meals plus Novolog 2 units if CBG > 300 mg/dl was prescribed for diabetes control.  Patient received Lantus 15 units last night and fasting glucose is over 200 mg/dl.  Thanks, Barnie Alderman, RN, MSN, CCRN Diabetes Coordinator Inpatient Diabetes Program [phone] (Team Pager) [phone] (AP office) [phone] [facility] office)

## 2014-07-19 NOTE — Progress Notes (Signed)
Utilization Review Completed.  

## 2014-07-19 NOTE — Telephone Encounter (Signed)
Please let Jason [name] and mother know that only yeast grew out of urine cultureand that he should continue on the diflucan as prescribed. If urine not clear by end of the rx, they may refill the rx for another 7 days of treatment. thx

## 2014-07-19 NOTE — Progress Notes (Signed)
[place] TEAM 1 - Stepdown/ICU TEAM Progress Note  Jason [name] [id number] DOB: 01-Mar-1986 DOA: 07/18/2014 PCP: [name], MD  Admit HPI / Brief Narrative: 28 y.o. male with history of diabetes mellitus 1, spinal cord infarct with tetraplegia, gastroparesis, and asthma, who presented with AMS.  Due to and indwelling catheter patient has recurrent UTIs.  He was recently treated for another episode of UTI with Levaquin. After that treatment, he developed a bladder yeast infection. He was started on Diflucan on 07/16/14. He also takes several pain or sedative medications, including baclofen, metoclopramide, MS Contin, Zofran, oxycodone, and Lyrica. Because of worsening leg pain, he took double doses of baclofen after which he became lethargic.   The patient was taken to the [facility] where he was found to have WBC 19.0, and evidence of a probable urinary tract infection. Additional hx revealed that he had also suffered an apparent bladder cath occlusion the day prior, with 2200cc of urine obtained via catheter change.    HPI/Subjective: Pt has returned to his baseline per family/friends in room w/ him.  He denies current new complaints.  Denies cp, n/v, abdom pain, or cp.    Assessment/Plan:  Acute encephalopathy Toxic metabolic encephalopathy due to UTI + excessive dosing of baclofen - resume usual home med regimen now that pt improving and follow mental status   UTI Cont diflucan as initiated as outpt - clinically improving   Diabetes mellitus type 2 with gastroparesis Poorly controlled - adjust tx and follow trend  Spinal cord infarct with tetraplegia  Code Status: FULL Family Communication: spoke w/ father at bedside  Disposition Plan: SDU - possible d/c in AM   Consultants: none  Procedures: none  Antibiotics: Cipro 10/11 > 10/12 Diflucan 1012 >  DVT prophylaxis: SQ heparin   Objective: Blood pressure 132/84, pulse 82, temperature 98.4 F (36.9  C), temperature source Oral, resp. rate 12, height 5\' 8"  (1.727 m), weight 61.6 kg (135 lb 12.9 oz), SpO2 99.00%.  Intake/Output Summary (Last 24 hours) at 07/19/14 1346 Last data filed at 07/19/14 0833  Gross per 24 hour  Intake    200 ml  Output    825 ml  Net   -625 ml   Exam: General: No acute respiratory distress Lungs: Clear to auscultation bilaterally without wheezes or crackles Cardiovascular: Regular rate and rhythm without murmur gallop or rub normal S1 and S2 Abdomen: Nontender, nondistended, soft, bowel sounds positive, no rebound, no ascites, no appreciable mass Extremities: No significant cyanosis, clubbing, or edema bilateral lower extremities  Data Reviewed: Basic Metabolic Panel:  Recent Labs Lab 07/18/14 2149 07/19/14 0413  NA 142 138  K 4.1 4.0  CL 100 99  CO2 26 25  GLUCOSE 171* 286*  BUN 23 21  CREATININE 1.18 1.00  CALCIUM 9.8 9.3    Liver Function Tests:  Recent Labs Lab 07/18/14 2149 07/19/14 0413  AST 26 23  ALT 41 33  ALKPHOS 184* 153*  BILITOT 0.4 0.3  PROT 8.4* 7.1  ALBUMIN 3.0* 2.6*   CBC:  Recent Labs Lab 07/18/14 2149 07/19/14 0413  WBC 19.2* 17.7*  NEUTROABS 12.8*  --   HGB 11.9* 9.0*  HCT 36.4* 27.7*  MCV 77.4* 78.2  PLT 450* 415*   BNP (last 3 results)  Recent Labs  09/28/13 0430 10/02/13 0445 01/29/14 2113  PROBNP 673.0* 2451.0* 1343.0*    CBG:  Recent Labs Lab 07/17/14 1614 07/18/14 1933 07/19/14 0023 07/19/14 0828 07/19/14 1241  GLUCAP 178*  92 274* 237* 238*    Recent Results (from the past 240 hour(s))  URINE CULTURE     Status: None   Collection Time    07/16/14 12:19 PM      Result Value Ref Range Status   Colony Count >=100,000 COLONIES/ML   Final   Organism ID, Bacteria Yeast   Final  MRSA PCR SCREENING     Status: None   Collection Time    07/18/14  8:10 PM      Result Value Ref Range Status   MRSA by PCR NEGATIVE  NEGATIVE Final   Comment:            The GeneXpert MRSA Assay (FDA      approved for NASAL specimens     only), is one component of a     comprehensive MRSA colonization     surveillance program. It is not     intended to diagnose MRSA     infection nor to guide or     monitor treatment for     MRSA infections.     Studies:  Recent x-ray studies have been reviewed in detail by the Attending Physician  Scheduled Meds:  Scheduled Meds: . ciprofloxacin  400 mg Intravenous Q12H  . diphenoxylate-atropine  1 tablet Oral BID  . feeding supplement (GLUCERNA SHAKE)  237 mL Oral TID BM  . fluconazole  100 mg Oral Daily  . heparin  5,000 Units Subcutaneous 3 times per day  . [START ON 07/20/2014] Influenza vac split quadrivalent PF  0.5 mL Intramuscular Tomorrow-1000  . insulin aspart  0-9 Units Subcutaneous TID WC  . insulin glargine  15 Units Subcutaneous QHS  . metoCLOPramide  5 mg Oral Once per day on Mon Thu  . morphine  30 mg Oral Q12H  . pantoprazole  40 mg Oral Daily  . [START ON 07/20/2014] silver sulfADIAZINE  1 application Topical Once per day on Tue Thu  . sodium chloride  3 mL Intravenous Q12H    Time spent on care of this patient: 35 mins   Yobany Vroom T , MD   [facility] Office  [phone] Pager - Text Page per Shea Evans as per below:  On-Call/Text Page:      [email or web address]      password [email or web address]  If 7PM-7AM, please contact night-coverage [email or web address] Password [email or web address] 07/19/2014, 1:46 PM   LOS: 1 day

## 2014-07-19 NOTE — Progress Notes (Signed)
INITIAL NUTRITION ASSESSMENT  DOCUMENTATION CODES Per approved criteria  -Non-severe (moderate) malnutrition in the context of chronic illness   INTERVENTION: Glucerna Shake po TID, each supplement provides 350 kcal and 13 grams of protein RD to follow for nutrition care plan  NUTRITION DIAGNOSIS: Increased nutrient needs related to acute illness, malnutrition as evidenced by estimated nutrition needs  Goal: Pt to meet >/= 90% of their estimated nutrition needs   Monitor:  PO & supplemental intake, weight, labs, I/O's  Reason for Assessment: Malnutrition Screening Tool Report  28 y.o. male  Admitting Dx: Acute encephalopathy  ASSESSMENT: 28 y.o. Male with history of diabetes mellitus1, spinal cord infarct with tetraplegia, gastroparesis, asthma, who presented with AMS; found to have leukocytosis with WBC 19.0, urinary tract infection with positive UA; after treated with IV fluids and started with Rocephin, patient transferred to [facility] for further evaluation and treatment.  Patient reports a good appetite at this time; no % PO intake available per flowsheet records; pt seen per Clinical Nutrition during previous hospitalizations; pt with hx of poor PO intake and weight loss; + mild to moderate wasting to upper & lower body; would like Glucerna Shake TID with meals; RD to order.  Nutrition Focused Physical Exam:   Subcutaneous Fat:  Orbital Region: N/A Upper Arm Region: moderate wasting  Thoracic and Lumbar Region: N/A  Muscle:  Temple Region: mild wasting  Clavicle Bone Region: moderate wasting  Clavicle and Acromion Bone Region: moderate wasting  Scapular Bone Region: N/A Dorsal Hand: mild wasting  Patellar Region: moderate wasting Anterior Thigh Region: moderate wasting Posterior Calf Region: moderate wasting  Edema: none   Patient meets criteria for non-severe (moderate) malnutrition in the context of chronic illness as evidenced by mild to moderate subcutaneous fat  loss & muscle loss.  Height: Ht Readings from Last 1 Encounters:  07/19/14 5\' 8"  (1.727 m)    Weight: Wt Readings from Last 1 Encounters:  07/19/14 135 lb 12.9 oz (61.6 kg)    Ideal Body Weight: 154 lb  % Ideal Body Weight: 87%  Wt Readings from Last 20 Encounters:  07/19/14 135 lb 12.9 oz (61.6 kg)  05/28/14 138 lb 14.2 oz (63 kg)  05/20/14 141 lb (63.957 kg)  04/19/14 140 lb (63.504 kg)  04/02/14 140 lb (63.504 kg)  03/22/14 140 lb (63.504 kg)  03/21/14 140 lb 9.6 oz (63.776 kg)  03/03/14 128 lb 12 oz (58.4 kg)  02/04/14 149 lb 11.1 oz (67.9 kg)  12/25/13 126 lb 15.8 oz (57.6 kg)  12/22/13 154 lb (69.854 kg)  10/13/13 154 lb (69.854 kg)  10/05/13 145 lb (65.772 kg)  09/13/13 136 lb (61.689 kg)  08/26/13 139 lb (63.05 kg)  08/21/13 125 lb (56.7 kg)  08/05/13 125 lb (56.7 kg)  07/06/13 125 lb (56.7 kg)  06/30/13 125 lb 3.5 oz (56.8 kg)  06/30/13 124 lb (56.246 kg)    Usual Body Weight: 140 lb   % Usual Body Weight: 96%  BMI:  Body mass index is 20.65 kg/(m^2).  Estimated Nutritional Needs: Kcal: 1800-2000 Protein: 90-100 gm Fluid: 1.8-2.0 L  Skin: Intact  Diet Order: Carb Control  EDUCATION NEEDS: -No education needs identified at this time   Intake/Output Summary (Last 24 hours) at 07/19/14 1152 Last data filed at 07/19/14 0833  Gross per 24 hour  Intake    200 ml  Output    825 ml  Net   -625 ml    Labs:   Recent Labs Lab 07/18/14 2149  07/19/14 0413  NA 142 138  K 4.1 4.0  CL 100 99  CO2 26 25  BUN 23 21  CREATININE 1.18 1.00  CALCIUM 9.8 9.3  GLUCOSE 171* 286*    CBG (last 3)   Recent Labs  07/18/14 1933 07/19/14 0023 07/19/14 0828  GLUCAP 92 274* 237*    Scheduled Meds: . ciprofloxacin  400 mg Intravenous Q12H  . diphenoxylate-atropine  1 tablet Oral BID  . fluconazole  100 mg Oral Daily  . heparin  5,000 Units Subcutaneous 3 times per day  . [START ON 07/20/2014] Influenza vac split quadrivalent PF  0.5 mL  Intramuscular Tomorrow-1000  . insulin aspart  0-9 Units Subcutaneous TID WC  . insulin glargine  15 Units Subcutaneous QHS  . metoCLOPramide  5 mg Oral Once per day on Mon Thu  . morphine  30 mg Oral Q12H  . pantoprazole  40 mg Oral Daily  . [START ON 07/20/2014] silver sulfADIAZINE  1 application Topical Once per day on Tue Thu  . sodium chloride  3 mL Intravenous Q12H    Continuous Infusions:   Past Medical History  Diagnosis Date  . GERD (gastroesophageal reflux disease)   . Asthma   . Hx MRSA infection     on face  . Gastroparesis   . Diabetic neuropathy   . Seizures   . Stroke     spinal stroke in 4/15  . Diabetes mellitus     sees Dr. Loanne Drilling     Past Surgical History  Procedure Laterality Date  . Tonsillectomy      Arthur Holms, RD, LDN Pager #: [phone] After-Hours Pager #: [phone]

## 2014-07-20 ENCOUNTER — Ambulatory Visit: Payer: Medicaid Other

## 2014-07-20 ENCOUNTER — Encounter: Payer: Medicaid Other | Admitting: Occupational Therapy

## 2014-07-20 ENCOUNTER — Telehealth: Payer: Self-pay | Admitting: *Deleted

## 2014-07-20 DIAGNOSIS — K3184 Gastroparesis: Secondary | ICD-10-CM

## 2014-07-20 DIAGNOSIS — E785 Hyperlipidemia, unspecified: Secondary | ICD-10-CM

## 2014-07-20 DIAGNOSIS — N10 Acute tubulo-interstitial nephritis: Secondary | ICD-10-CM

## 2014-07-20 DIAGNOSIS — E1165 Type 2 diabetes mellitus with hyperglycemia: Secondary | ICD-10-CM

## 2014-07-20 DIAGNOSIS — G9519 Other vascular myelopathies: Secondary | ICD-10-CM

## 2014-07-20 DIAGNOSIS — I9589 Other hypotension: Secondary | ICD-10-CM

## 2014-07-20 DIAGNOSIS — E1143 Type 2 diabetes mellitus with diabetic autonomic (poly)neuropathy: Secondary | ICD-10-CM

## 2014-07-20 LAB — COMPREHENSIVE METABOLIC PANEL
ALT: 29 U/L (ref 0–53)
ANION GAP: 11 (ref 5–15)
AST: 22 U/L (ref 0–37)
Albumin: 2.6 g/dL — ABNORMAL LOW (ref 3.5–5.2)
Alkaline Phosphatase: 143 U/L — ABNORMAL HIGH (ref 39–117)
BILIRUBIN TOTAL: 0.2 mg/dL — AB (ref 0.3–1.2)
BUN: 13 mg/dL (ref 6–23)
CO2: 28 meq/L (ref 19–32)
Calcium: 9.8 mg/dL (ref 8.4–10.5)
Chloride: 99 mEq/L (ref 96–112)
Creatinine, Ser: 0.76 mg/dL (ref 0.50–1.35)
GFR calc Af Amer: >90 mL/min (ref >90)
GFR calc non Af Amer: >90 mL/min (ref >90)
Glucose, Bld: 144 mg/dL — ABNORMAL HIGH (ref 70–99)
Potassium: 3.9 mEq/L (ref 3.7–5.3)
Sodium: 138 mEq/L (ref 137–147)
Total Protein: 7.1 g/dL (ref 6.0–8.3)

## 2014-07-20 LAB — CBC WITH DIFFERENTIAL/PLATELET
BASOS PCT: 1 % (ref 0–1)
Basophils Absolute: 0.1 10*3/uL (ref 0.0–0.1)
EOS PCT: 7 % — AB (ref 0–5)
Eosinophils Absolute: 0.7 10*3/uL (ref 0.0–0.7)
HEMATOCRIT: 28.6 % — AB (ref 39.0–52.0)
HEMOGLOBIN: 9.2 g/dL — AB (ref 13.0–17.0)
LYMPHS PCT: 45 % (ref 12–46)
Lymphs Abs: 4.5 10*3/uL — ABNORMAL HIGH (ref 0.7–4.0)
MCH: 24.8 pg — ABNORMAL LOW (ref 26.0–34.0)
MCHC: 32.2 g/dL (ref 30.0–36.0)
MCV: 77.1 fL — AB (ref 78.0–100.0)
MONO ABS: 1.1 10*3/uL — AB (ref 0.1–1.0)
Monocytes Relative: 12 % (ref 3–12)
Neutro Abs: 3.4 10*3/uL (ref 1.7–7.7)
Neutrophils Relative %: 35 % — ABNORMAL LOW (ref 43–77)
Platelets: 415 10*3/uL — ABNORMAL HIGH (ref 150–400)
RBC: 3.71 MIL/uL — ABNORMAL LOW (ref 4.22–5.81)
RDW: 16.3 % — AB (ref 11.5–15.5)
WBC: 9.7 10*3/uL (ref 4.0–10.5)

## 2014-07-20 LAB — MAGNESIUM: Magnesium: 1.4 mg/dL — ABNORMAL LOW (ref 1.5–2.5)

## 2014-07-20 LAB — URINE CULTURE

## 2014-07-20 LAB — GLUCOSE, CAPILLARY
GLUCOSE-CAPILLARY: 177 mg/dL — AB (ref 70–99)
Glucose-Capillary: 244 mg/dL — ABNORMAL HIGH (ref 70–99)

## 2014-07-20 MED ORDER — PREGABALIN 75 MG PO CAPS: 100.0000 mg | ORAL_CAPSULE | Freq: Three times a day (TID) | ORAL | Status: DC

## 2014-07-20 MED ORDER — ATORVASTATIN CALCIUM 20 MG PO TABS: 20.0000 mg | ORAL_TABLET | Freq: Every day | ORAL | Status: DC

## 2014-07-20 MED ORDER — PREGABALIN 75 MG PO CAPS
100.0000 mg | ORAL_CAPSULE | Freq: Three times a day (TID) | ORAL | Status: DC
  Administered 2014-07-20: 100 mg via ORAL
  Filled 2014-07-20 (×2): qty 1

## 2014-07-20 MED ORDER — FLUCONAZOLE 200 MG PO TABS: 200.0000 mg | ORAL_TABLET | Freq: Every day | ORAL | Status: DC

## 2014-07-20 MED ORDER — ATORVASTATIN CALCIUM 20 MG PO TABS
20.0000 mg | ORAL_TABLET | Freq: Every day | ORAL | Status: DC
  Filled 2014-07-20: qty 1

## 2014-07-20 MED ORDER — INSULIN GLARGINE 100 UNIT/ML ~~LOC~~ SOLN: 22.0000 [IU] | Freq: Every day | SUBCUTANEOUS | Status: DC

## 2014-07-20 NOTE — Plan of Care (Signed)
Problem: Phase I Progression Outcomes Goal: Voiding-avoid urinary catheter unless indicated Outcome: Not Met (add Reason) Has chronic foley

## 2014-07-20 NOTE — Clinical Social Work Note (Signed)
Patient will discharge to home Anticipated discharge date:07/20/14 Family notified: family in room with patient Transportation by [facility]AR- called at 3:55  [place] signing off.  Domenica Reamer, [place] Social Worker [phone]

## 2014-07-20 NOTE — Discharge Summary (Signed)
Physician Discharge Summary  Jason [name] [id number] DOB: 04-Dec-1985 DOA: 07/18/2014  PCP: [name], MD  Admit date: 07/18/2014 Discharge date: 07/20/2014  Time spent: 40 minutes  Recommendations for Outpatient Follow-up:   Acute encephalopathy  -Toxic metabolic encephalopathy due to UTI + excessive dosing of baclofen, resolved  - resume usual home med regimen now that pt improving and follow mental status   Hypotension  -Continue midodrine 5 BID PRN  -Followup with PCP  UTI (funguria)  -Cont diflucan 200 mg for 14 days  -Followup with PCP   Diabetes mellitus type 1 with gastroparesis  -8/21 hemoglobin A1c = 9.0  -Start patient on his home regimen which is 1 unit NovoLog/ 15 carbohydrates  -Discharge on Lantus 22 units daily  -Followup with PCP   HLD  -Not within ADA guidelines, start Lipitor 20 mg daily   Spinal cord infarct with paraplegia  -Patient able to use upper extremities    Discharge Diagnoses:  Principal Problem:   Acute encephalopathy Active Problems:   Type 1 diabetes, uncontrolled, with neuropathy   Asthma   GERD   Gastroparesis due to DM   Spinal cord infarction (history of)   Tetraplegia   UTI (urinary tract infection)   Discharge Condition: Stable  Diet recommendation: Carb modified  Filed Weights   07/18/14 2008 07/19/14 0351  Weight: 60.3 kg (132 lb 15 oz) 61.6 kg (135 lb 12.9 oz)    History of present illness:  28 y.o. BM PMHx diabetes mellitus 1, spinal cord infarct with tetraplegia, gastroparesis, and asthma, Presented with AMS. Due to and indwelling catheter patient has recurrent UTIs. He was recently treated for another episode of UTI with Levaquin. After that treatment, he developed a bladder yeast infection. He was started on Diflucan on 07/16/14. He also takes several pain or sedative medications, including baclofen, metoclopramide, MS Contin, Zofran, oxycodone, and Lyrica. Because of worsening leg pain, he took double  doses of baclofen after which he became lethargic.  The patient was taken to the [facility] where he was found to have WBC 19.0, and evidence of a probable urinary tract infection. Additional hx revealed that he had also suffered an apparent bladder cath occlusion the day prior, with 2200cc of urine obtained via catheter change.  HPI/Subjective:  10/13 A./O. x4, states feels significantly improved and ready to be discharged. States he's changed his insulin regimen to 1 unit/15 carbohydrates, which was controlling his CBGs much better. Also stated his Lantus has been more from 25 units to 20 units but felt this was too much of a decrease.  Consultants:  NA    Procedure/Significant Events:  NA    Culture  10/9 urine positive for yeast  10/11 blood right antecubital/hand NGTD  10/11 urine pending    Antibiotics:  Cipro 10/11 > 10/12  Diflucan 10/12 >    Discharge Exam: Filed Vitals:   07/20/14 0000 07/20/14 0430 07/20/14 0800 07/20/14 1212  BP:  111/74 99/74 138/99  Pulse:  74 69 78  Temp: 97.6 F (36.4 C) 98.2 F (36.8 C) 97.9 F (36.6 C) 97.6 F (36.4 C)  TempSrc: Oral Oral Oral Oral  Resp:  10 15 13   Height:      Weight:      SpO2:  100% 100% 100%    General: A/O x4, No acute respiratory distress, Sitting comfortably in bed eating breakfast.  Lungs: Clear to auscultation bilaterally without wheezes or crackles  Cardiovascular: Regular rate and rhythm without murmur gallop or  rub normal S1 and S2  Abdomen: Nontender, nondistended, soft, bowel sounds positive, no rebound, no ascites, no appreciable mass  Extremities: No significant cyanosis, clubbing, or edema bilateral lower extremities (paralyze). Bilateral upper extremities strength 4/5, decreased fine motor skills   Discharge Instructions     Medication List         albuterol (2.5 MG/3ML) 0.083% nebulizer solution  Commonly known as:  PROVENTIL  Take 3 mLs (2.5 mg total) by nebulization every 4  (four) hours as needed for wheezing or shortness of breath.     atorvastatin 20 MG tablet  Commonly known as:  LIPITOR  Take 1 tablet (20 mg total) by mouth daily.     baclofen 10 MG tablet  Commonly known as:  LIORESAL  Take 10-20 mg by mouth 3 (three) times daily. Takes 10mg  in morning and lunchtime and takes 20mg  at bedtime     DEXILANT 60 MG capsule  Generic drug:  dexlansoprazole  Take 60 mg by mouth daily.     diclofenac sodium 1 % Gel  Commonly known as:  VOLTAREN  Apply 2 g topically daily as needed (for pain).     diphenoxylate-atropine 2.5-0.025 MG per tablet  Commonly known as:  LOMOTIL  Take 1 tablet by mouth 2 (two) times daily as needed for diarrhea or loose stools.     fluconazole 200 MG tablet  Commonly known as:  DIFLUCAN  Take 1 tablet (200 mg total) by mouth daily.  Start taking on:  07/21/2014     furosemide 40 MG tablet  Commonly known as:  LASIX  Take 40 mg by mouth daily as needed for fluid.     glucagon 1 MG injection  Commonly known as:  GLUCAGON EMERGENCY  Inject 1 mg into the vein once as needed.     insulin aspart 100 UNIT/ML injection  Commonly known as:  novoLOG  Inject 5 Units into the skin 3 (three) times daily with meals.     insulin glargine 100 UNIT/ML injection  Commonly known as:  LANTUS  Inject 0.22 mLs (22 Units total) into the skin at bedtime.     loratadine 10 MG tablet  Commonly known as:  CLARITIN  Take 10 mg by mouth 2 (two) times daily as needed for allergies.     metoCLOPramide 5 MG tablet  Commonly known as:  REGLAN  Take 1 tablet (5 mg total) by mouth 2 (two) times a week. And as needed for nausea     morphine 30 MG 12 hr tablet  Commonly known as:  MS CONTIN  Take 1 tablet (30 mg total) by mouth every 12 (twelve) hours.     oxyCODONE-acetaminophen 10-325 MG per tablet  Commonly known as:  PERCOCET  Take 1 tablet by mouth every 8 (eight) hours as needed for pain.     pregabalin 100 MG capsule  Commonly known  as:  LYRICA  Take 100-200 mg by mouth 4 (four) times daily. Take 100 mg by mouth in the morning, take 100 mg by mouth in the afternoon, take 100 mg by mouth in the evening and take 200 mg by mouth at bedtime.     silver sulfADIAZINE 1 % cream  Commonly known as:  SILVADENE  Apply 1 application topically 2 (two) times a week. Tuesday and thursday       Allergies  Allergen Reactions  . Cefuroxime Axetil Anaphylaxis  . Penicillins Anaphylaxis    ?can take amoxicillin?  [name] [Benzonatate] Anaphylaxis  .  Shellfish Allergy Itching    Took benadryl to alleviate reaction       Follow-up Information   Follow up with FRY,STEPHEN A, MD. Schedule an appointment as soon as possible for a visit in 1 week. [facility] followup; acute encephalopathy, funguria, uncontrolled diabetes type 2. Evaluate patient's insulin regimen, and Shur-Clens of funguria)    Specialty:  Family Medicine   Contact information:   [place] [address] [phone]        The results of significant diagnostics from this hospitalization (including imaging, microbiology, ancillary and laboratory) are listed below for reference.    Significant Diagnostic Studies: No results found.  Microbiology: Recent Results (from the past 240 hour(s))  URINE CULTURE     Status: None   Collection Time    07/16/14 12:19 PM      Result Value Ref Range Status   Colony Count >=100,000 COLONIES/ML   Final   Organism ID, Bacteria Yeast   Final  MRSA PCR SCREENING     Status: None   Collection Time    07/18/14  8:10 PM      Result Value Ref Range Status   MRSA by PCR NEGATIVE  NEGATIVE Final   Comment:            The GeneXpert MRSA Assay (FDA     approved for NASAL specimens     only), is one component of a     comprehensive MRSA colonization     surveillance program. It is not     intended to diagnose MRSA     infection nor to guide or     monitor treatment for     MRSA infections.  CULTURE,  BLOOD (ROUTINE X 2)     Status: None   Collection Time    07/18/14  9:49 PM      Result Value Ref Range Status   Specimen Description BLOOD RIGHT ANTECUBITAL   Final   Special Requests     Final   Value: BOTTLES DRAWN AEROBIC AND ANAEROBIC 10CC BLUE 5CC RED   Culture  Setup Time     Final   Value: 07/19/2014 02:38     Performed at [facility]   Culture     Final   Value:        BLOOD CULTURE RECEIVED NO GROWTH TO DATE CULTURE WILL BE HELD FOR 5 DAYS BEFORE ISSUING A FINAL NEGATIVE REPORT     Performed at [facility]   Report Status PENDING   Incomplete  CULTURE, BLOOD (ROUTINE X 2)     Status: None   Collection Time    07/18/14 10:04 PM      Result Value Ref Range Status   Specimen Description BLOOD RIGHT HAND   Final   Special Requests BOTTLES DRAWN AEROBIC ONLY 5CC   Final   Culture  Setup Time     Final   Value: 07/19/2014 02:38     Performed at [facility]   Culture     Final   Value:        BLOOD CULTURE RECEIVED NO GROWTH TO DATE CULTURE WILL BE HELD FOR 5 DAYS BEFORE ISSUING A FINAL NEGATIVE REPORT     Performed at [facility]   Report Status PENDING   Incomplete  URINE CULTURE     Status: None   Collection Time    07/18/14 10:29 PM      Result Value Ref Range Status  Specimen Description URINE, CATHETERIZED   Final   Special Requests NONE   Final   Culture  Setup Time     Final   Value: 07/18/2014 22:25     Performed at [place] Count     Final   Value: 55,000 COLONIES/ML     Performed at [facility]   Culture     Final   Value: YEAST     Performed at [facility]   Report Status 07/20/2014 FINAL   Final     Labs: Basic Metabolic Panel:  Recent Labs Lab 07/18/14 2149 07/19/14 0413 07/20/14 0754  NA 142 138 138  K 4.1 4.0 3.9  CL 100 99 99  CO2 26 25 28   GLUCOSE 171* 286* 144*  BUN 23 21 13   CREATININE 1.18 1.00 0.76  CALCIUM 9.8 9.3 9.8  MG  --   --  1.4*   Liver Function  Tests:  Recent Labs Lab 07/18/14 2149 07/19/14 0413 07/20/14 0754  AST 26 23 22   ALT 41 33 29  ALKPHOS 184* 153* 143*  BILITOT 0.4 0.3 0.2*  PROT 8.4* 7.1 7.1  ALBUMIN 3.0* 2.6* 2.6*   No results found for this basename: LIPASE, AMYLASE,  in the last 168 hours No results found for this basename: AMMONIA,  in the last 168 hours CBC:  Recent Labs Lab 07/18/14 2149 07/19/14 0413 07/20/14 0754  WBC 19.2* 17.7* 9.7  NEUTROABS 12.8*  --  3.4  HGB 11.9* 9.0* 9.2*  HCT 36.4* 27.7* 28.6*  MCV 77.4* 78.2 77.1*  PLT 450* 415* 415*   Cardiac Enzymes: No results found for this basename: CKTOTAL, CKMB, CKMBINDEX, TROPONINI,  in the last 168 hours BNP: BNP (last 3 results)  Recent Labs  09/28/13 0430 10/02/13 0445 01/29/14 2113  PROBNP 673.0* 2451.0* 1343.0*   CBG:  Recent Labs Lab 07/19/14 1241 07/19/14 1537 07/19/14 2205 07/20/14 0758 07/20/14 1211  GLUCAP 238* 199* 237* 177* 244*       Signed:  Dia Crawford, MD [facility] [phone] pager

## 2014-07-20 NOTE — Telephone Encounter (Signed)
Spoke to patients mother  And Jason [name] is in the hospital Jason [name] 2C 13).  He has been since Sunday because the UTI made him unresponsive.  I told his mom to let us know his status

## 2014-07-20 NOTE — Progress Notes (Deleted)
[place] TEAM 1 - Stepdown/ICU TEAM Progress Note  Jason [name] VVO:[id number] DOB: 05/15/1986 DOA: 07/18/2014 PCP: [name], MD  Admit HPI / Brief Narrative: 27 y.o. BM PMHx diabetes mellitus 1, spinal cord infarct with tetraplegia, gastroparesis, and asthma, Presented with AMS. Due to and indwelling catheter patient has recurrent UTIs. He was recently treated for another episode of UTI with Levaquin. After that treatment, he developed a bladder yeast infection. He was started on Diflucan on 07/16/14. He also takes several pain or sedative medications, including baclofen, metoclopramide, MS Contin, Zofran, oxycodone, and Lyrica. Because of worsening leg pain, he took double doses of baclofen after which he became lethargic.  The patient was taken to the [facility] where he was found to have WBC 19.0, and evidence of a probable urinary tract infection. Additional hx revealed that he had also suffered an apparent bladder cath occlusion the day prior, with 2200cc of urine obtained via catheter change.   HPI/Subjective: 10/13 A./O. x4, states feels significantly improved and ready to be discharged. States he's changed his insulin regimen to 1 unit/15 carbohydrates, which was controlling his CBGs much better. Also stated his Lantus has been more from 25 units to 20 units but felt this was too much of a decrease.  Assessment/Plan: Acute encephalopathy  -Toxic metabolic encephalopathy due to UTI + excessive dosing of baclofen, resolved  - resume usual home med regimen now that pt improving and follow mental status   Hypotension -Continue midodrine 5 BID PRN   UTI (funguria)  -Cont diflucan 200 mg for 14 days -Followup with PCP  Diabetes mellitus type 1 with gastroparesis  -8/21 hemoglobin A1c = 9.0  -Start patient on his home regimen which is 1 unit NovoLog/ 15 carbohydrates  -Discharge on Lantus 22 units daily  -Followup with PCP  HLD -Not within ADA guidelines, start  Lipitor 20 mg daily  Spinal cord infarct with paraplegia  -Patient able to use upper extremities   Code Status: FULL Family Communication: no family present at time of exam Disposition Plan: Discharge home    Consultants: NA  Procedure/Significant Events: NA   Culture 10/9 urine positive for yeast 10/11 blood right antecubital/hand NGTD 10/11 urine pending    Antibiotics: Cipro 10/11 > 10/12  Diflucan 10/12 >    DVT prophylaxis: Subcutaneous heparin   Devices Chronic indwelling Foley, change on 10/11   LINES / TUBES:      Continuous Infusions:   Objective: VITAL SIGNS: Temp: 97.6 F (36.4 C) (10/13 1212) Temp Source: Oral (10/13 1212) BP: 138/99 mmHg (10/13 1212) Pulse Rate: 78 (10/13 1212) SPO2; 100% on room air FIO2:   Intake/Output Summary (Last 24 hours) at 07/20/14 1312 Last data filed at 07/20/14 0445  Gross per 24 hour  Intake    720 ml  Output   1750 ml  Net  -1030 ml     Exam: General: A/O x4,  No acute respiratory distress, Sitting comfortably in bed eating breakfast. Lungs: Clear to auscultation bilaterally without wheezes or crackles Cardiovascular: Regular rate and rhythm without murmur gallop or rub normal S1 and S2 Abdomen: Nontender, nondistended, soft, bowel sounds positive, no rebound, no ascites, no appreciable mass Extremities: No significant cyanosis, clubbing, or edema bilateral lower extremities  Data Reviewed: Basic Metabolic Panel:  Recent Labs Lab 07/18/14 2149 07/19/14 0413 07/20/14 0754  NA 142 138 138  K 4.1 4.0 3.9  CL 100 99 99  CO2 26 25 28   GLUCOSE 171* 286* 144*  BUN 23 21 13   CREATININE 1.18 1.00 0.76  CALCIUM 9.8 9.3 9.8  MG  --   --  1.4*   Liver Function Tests:  Recent Labs Lab 07/18/14 2149 07/19/14 0413 07/20/14 0754  AST 26 23 22   ALT 41 33 29  ALKPHOS 184* 153* 143*  BILITOT 0.4 0.3 0.2*  PROT 8.4* 7.1 7.1  ALBUMIN 3.0* 2.6* 2.6*   No results found for this basename:  LIPASE, AMYLASE,  in the last 168 hours No results found for this basename: AMMONIA,  in the last 168 hours CBC:  Recent Labs Lab 07/18/14 2149 07/19/14 0413 07/20/14 0754  WBC 19.2* 17.7* 9.7  NEUTROABS 12.8*  --  3.4  HGB 11.9* 9.0* 9.2*  HCT 36.4* 27.7* 28.6*  MCV 77.4* 78.2 77.1*  PLT 450* 415* 415*   Cardiac Enzymes: No results found for this basename: CKTOTAL, CKMB, CKMBINDEX, TROPONINI,  in the last 168 hours BNP (last 3 results)  Recent Labs  09/28/13 0430 10/02/13 0445 01/29/14 2113  PROBNP 673.0* 2451.0* 1343.0*   CBG:  Recent Labs Lab 07/19/14 1241 07/19/14 1537 07/19/14 2205 07/20/14 0758 07/20/14 1211  GLUCAP 238* 199* 237* 177* 244*    Recent Results (from the past 240 hour(s))  URINE CULTURE     Status: None   Collection Time    07/16/14 12:19 PM      Result Value Ref Range Status   Colony Count >=100,000 COLONIES/ML   Final   Organism ID, Bacteria Yeast   Final  MRSA PCR SCREENING     Status: None   Collection Time    07/18/14  8:10 PM      Result Value Ref Range Status   MRSA by PCR NEGATIVE  NEGATIVE Final   Comment:            The GeneXpert MRSA Assay (FDA     approved for NASAL specimens     only), is one component of a     comprehensive MRSA colonization     surveillance program. It is not     intended to diagnose MRSA     infection nor to guide or     monitor treatment for     MRSA infections.  CULTURE, BLOOD (ROUTINE X 2)     Status: None   Collection Time    07/18/14  9:49 PM      Result Value Ref Range Status   Specimen Description BLOOD RIGHT ANTECUBITAL   Final   Special Requests     Final   Value: BOTTLES DRAWN AEROBIC AND ANAEROBIC 10CC BLUE 5CC RED   Culture  Setup Time     Final   Value: 07/19/2014 02:38     Performed at [facility]   Culture     Final   Value:        BLOOD CULTURE RECEIVED NO GROWTH TO DATE CULTURE WILL BE HELD FOR 5 DAYS BEFORE ISSUING A FINAL NEGATIVE REPORT     Performed at [facility]   Report Status PENDING   Incomplete  CULTURE, BLOOD (ROUTINE X 2)     Status: None   Collection Time    07/18/14 10:04 PM      Result Value Ref Range Status   Specimen Description BLOOD RIGHT HAND   Final   Special Requests BOTTLES DRAWN AEROBIC ONLY 5CC   Final   Culture  Setup Time     Final   Value: 07/19/2014 02:38  Performed at [facility]     Final   Value:        BLOOD CULTURE RECEIVED NO GROWTH TO DATE CULTURE WILL BE HELD FOR 5 DAYS BEFORE ISSUING A FINAL NEGATIVE REPORT     Performed at [facility]   Report Status PENDING   Incomplete     Studies:  Recent x-ray studies have been reviewed in detail by the Attending Physician  Scheduled Meds:  Scheduled Meds: . atorvastatin  20 mg Oral q1800  . baclofen  10 mg Oral BID WC   And  . baclofen  20 mg Oral QHS  . diphenoxylate-atropine  1 tablet Oral BID  . feeding supplement (GLUCERNA SHAKE)  237 mL Oral TID BM  . [START ON 07/21/2014] fluconazole  200 mg Oral Daily  . heparin  5,000 Units Subcutaneous 3 times per day  . Influenza vac split quadrivalent PF  0.5 mL Intramuscular Tomorrow-1000  . insulin aspart  5 Units Subcutaneous TID WC  . insulin glargine  20 Units Subcutaneous QHS  . metoCLOPramide  5 mg Oral Once per day on Mon Thu  . morphine  30 mg Oral Q12H  . pantoprazole  40 mg Oral Daily  . pregabalin  100 mg Oral TID WC  . pregabalin  200 mg Oral QHS  . silver sulfADIAZINE  1 application Topical Once per day on Tue Thu  . sodium chloride  3 mL Intravenous Q12H    Time spent on care of this patient: 40 mins   Allie Bossier , MD   [facility] Office  [phone] Pager - [phone]  On-Call/Text Page:      [email or web address]      password [email or web address]  If 7PM-7AM, please contact night-coverage [email or web address] Password [email or web address] 07/20/2014, 1:12 PM   LOS: 2 days

## 2014-07-21 ENCOUNTER — Other Ambulatory Visit: Payer: Self-pay | Admitting: Nurse Practitioner

## 2014-07-21 ENCOUNTER — Other Ambulatory Visit: Payer: Self-pay

## 2014-07-21 MED ORDER — INSULIN GLARGINE 100 UNIT/ML ~~LOC~~ SOLN: 22.0000 [IU] | Freq: Every day | SUBCUTANEOUS | Status: DC

## 2014-07-21 MED ORDER — DIPHENOXYLATE-ATROPINE 2.5-0.025 MG PO TABS: 1.0000 | ORAL_TABLET | Freq: Two times a day (BID) | ORAL | Status: DC

## 2014-07-21 MED ORDER — INSULIN ASPART 100 UNIT/ML ~~LOC~~ SOLN: 5.0000 [IU] | Freq: Three times a day (TID) | SUBCUTANEOUS | Status: DC

## 2014-07-21 MED ORDER — GLUCOSE BLOOD VI STRP: ORAL_STRIP | Status: DC

## 2014-07-21 NOTE — Telephone Encounter (Signed)
Jason [name], this needs to be paper rx. We can give two refill but then he should have a follow up in 2-3 months. I don't know which doc he has seen here but please get him in with whoever that is.  Thanks

## 2014-07-21 NOTE — Telephone Encounter (Signed)
Jason [name],  Refill request from Pharmacy requesting refill of patients Lomotil Do you want to refill?

## 2014-07-22 ENCOUNTER — Other Ambulatory Visit: Payer: Self-pay | Admitting: Nurse Practitioner

## 2014-07-23 ENCOUNTER — Ambulatory Visit: Payer: Medicaid Other

## 2014-07-23 ENCOUNTER — Encounter: Payer: Medicaid Other | Admitting: Occupational Therapy

## 2014-07-23 ENCOUNTER — Telehealth: Payer: Self-pay | Admitting: Family Medicine

## 2014-07-23 NOTE — Telephone Encounter (Signed)
Call [place] at [phone].

## 2014-07-23 NOTE — Telephone Encounter (Signed)
Pt Mom called and said they are requesting orders to continue his physical and occupational therapy

## 2014-07-23 NOTE — Telephone Encounter (Signed)
Please put in these orders

## 2014-07-25 LAB — CULTURE, BLOOD (ROUTINE X 2)
CULTURE: NO GROWTH
Culture: NO GROWTH

## 2014-07-26 ENCOUNTER — Telehealth: Payer: Self-pay | Admitting: Physical Medicine & Rehabilitation

## 2014-07-26 ENCOUNTER — Encounter: Payer: Medicaid Other | Admitting: Occupational Therapy

## 2014-07-26 ENCOUNTER — Ambulatory Visit: Payer: Medicaid Other

## 2014-07-26 ENCOUNTER — Telehealth: Payer: Self-pay | Admitting: *Deleted

## 2014-07-26 NOTE — Telephone Encounter (Signed)
I faxed order to below number.  

## 2014-07-26 NOTE — Telephone Encounter (Signed)
Ok. Sounds good. thanks

## 2014-07-26 NOTE — Telephone Encounter (Signed)
Mother called - needs verbal order to restart PT/OT. Ok to resume - left message

## 2014-07-26 NOTE — Telephone Encounter (Signed)
I spoke with [facility], pt last had therapy in September 2015. We will have to write a script for more therapy with diagnosis code and fax to [phone].

## 2014-07-26 NOTE — Telephone Encounter (Signed)
OT to resume to Monday October 19,2015

## 2014-07-27 ENCOUNTER — Telehealth: Payer: Self-pay | Admitting: Family Medicine

## 2014-07-27 DIAGNOSIS — G9511 Acute infarction of spinal cord (embolic) (nonembolic): Secondary | ICD-10-CM

## 2014-07-27 NOTE — Telephone Encounter (Signed)
Melissa with [facility] got an order to continue OT and PT with outpatient therapy. Did you send to [facility] in [facility]?

## 2014-07-27 NOTE — Telephone Encounter (Signed)
I put order in Epic and also faxed to [phone] [facility], also spoke with pt's mom and gave below advice.

## 2014-07-27 NOTE — Telephone Encounter (Signed)
Please order OT and PT through [facility] and tell Nicola to take the Lipitor as prescribed

## 2014-07-27 NOTE — Telephone Encounter (Signed)
I spoke with Melissa from [facility] and she is working on getting below order approved. If it does not go through then we can send order to [facility] phone # is [phone]. Ask pt which place he prefers? Also pt's mom wants to know why the Lipitor 20 mg was prescribed on 07/20/14 from Dr. Dia Crawford at [place]? Pt already picked up script, however has not started on it yet?

## 2014-07-29 ENCOUNTER — Telehealth: Payer: Self-pay

## 2014-07-29 ENCOUNTER — Emergency Department (HOSPITAL_COMMUNITY): Payer: Medicaid Other

## 2014-07-29 ENCOUNTER — Encounter (HOSPITAL_COMMUNITY): Payer: Self-pay | Admitting: Emergency Medicine

## 2014-07-29 ENCOUNTER — Observation Stay (HOSPITAL_COMMUNITY)
Admission: EM | Admit: 2014-07-29 | Discharge: 2014-07-30 | Disposition: A | Discharge status: Home / Self Care | Payer: Medicaid Other | Attending: Internal Medicine | Admitting: Internal Medicine

## 2014-07-29 ENCOUNTER — Ambulatory Visit: Payer: Medicaid Other | Admitting: Occupational Therapy

## 2014-07-29 ENCOUNTER — Telehealth: Payer: Self-pay | Admitting: Family Medicine

## 2014-07-29 DIAGNOSIS — Z720 Tobacco use: Secondary | ICD-10-CM | POA: Insufficient documentation

## 2014-07-29 DIAGNOSIS — I69898 Other sequelae of other cerebrovascular disease: Secondary | ICD-10-CM | POA: Diagnosis not present

## 2014-07-29 DIAGNOSIS — M1990 Unspecified osteoarthritis, unspecified site: Secondary | ICD-10-CM | POA: Diagnosis not present

## 2014-07-29 DIAGNOSIS — J45909 Unspecified asthma, uncomplicated: Secondary | ICD-10-CM | POA: Insufficient documentation

## 2014-07-29 DIAGNOSIS — M797 Fibromyalgia: Secondary | ICD-10-CM | POA: Diagnosis not present

## 2014-07-29 DIAGNOSIS — G9511 Acute infarction of spinal cord (embolic) (nonembolic): Secondary | ICD-10-CM

## 2014-07-29 DIAGNOSIS — K3184 Gastroparesis: Secondary | ICD-10-CM | POA: Insufficient documentation

## 2014-07-29 DIAGNOSIS — R7401 Elevation of levels of liver transaminase levels: Secondary | ICD-10-CM

## 2014-07-29 DIAGNOSIS — I499 Cardiac arrhythmia, unspecified: Secondary | ICD-10-CM | POA: Insufficient documentation

## 2014-07-29 DIAGNOSIS — Z8673 Personal history of transient ischemic attack (TIA), and cerebral infarction without residual deficits: Secondary | ICD-10-CM | POA: Insufficient documentation

## 2014-07-29 DIAGNOSIS — Z880 Allergy status to penicillin: Secondary | ICD-10-CM | POA: Insufficient documentation

## 2014-07-29 DIAGNOSIS — Z794 Long term (current) use of insulin: Secondary | ICD-10-CM | POA: Diagnosis not present

## 2014-07-29 DIAGNOSIS — Z8614 Personal history of Methicillin resistant Staphylococcus aureus infection: Secondary | ICD-10-CM | POA: Diagnosis not present

## 2014-07-29 DIAGNOSIS — K219 Gastro-esophageal reflux disease without esophagitis: Secondary | ICD-10-CM | POA: Diagnosis not present

## 2014-07-29 DIAGNOSIS — Z79899 Other long term (current) drug therapy: Secondary | ICD-10-CM | POA: Diagnosis not present

## 2014-07-29 DIAGNOSIS — E119 Type 2 diabetes mellitus without complications: Secondary | ICD-10-CM

## 2014-07-29 DIAGNOSIS — IMO0001 Reserved for inherently not codable concepts without codable children: Secondary | ICD-10-CM

## 2014-07-29 DIAGNOSIS — E109 Type 1 diabetes mellitus without complications: Secondary | ICD-10-CM | POA: Diagnosis not present

## 2014-07-29 DIAGNOSIS — E1140 Type 2 diabetes mellitus with diabetic neuropathy, unspecified: Secondary | ICD-10-CM | POA: Diagnosis not present

## 2014-07-29 DIAGNOSIS — R5381 Other malaise: Secondary | ICD-10-CM | POA: Diagnosis not present

## 2014-07-29 DIAGNOSIS — K2100 Gastro-esophageal reflux disease with esophagitis, without bleeding: Secondary | ICD-10-CM

## 2014-07-29 DIAGNOSIS — E1065 Type 1 diabetes mellitus with hyperglycemia: Secondary | ICD-10-CM

## 2014-07-29 DIAGNOSIS — R531 Weakness: Secondary | ICD-10-CM | POA: Diagnosis not present

## 2014-07-29 DIAGNOSIS — Z8701 Personal history of pneumonia (recurrent): Secondary | ICD-10-CM | POA: Insufficient documentation

## 2014-07-29 DIAGNOSIS — I10 Essential (primary) hypertension: Principal | ICD-10-CM

## 2014-07-29 DIAGNOSIS — IMO0002 Reserved for concepts with insufficient information to code with codable children: Secondary | ICD-10-CM | POA: Diagnosis present

## 2014-07-29 DIAGNOSIS — E875 Hyperkalemia: Secondary | ICD-10-CM

## 2014-07-29 DIAGNOSIS — R740 Nonspecific elevation of levels of transaminase and lactic acid dehydrogenase [LDH]: Secondary | ICD-10-CM

## 2014-07-29 DIAGNOSIS — M6281 Muscle weakness (generalized): Secondary | ICD-10-CM | POA: Diagnosis present

## 2014-07-29 DIAGNOSIS — E1143 Type 2 diabetes mellitus with diabetic autonomic (poly)neuropathy: Secondary | ICD-10-CM

## 2014-07-29 DIAGNOSIS — E1040 Type 1 diabetes mellitus with diabetic neuropathy, unspecified: Secondary | ICD-10-CM | POA: Diagnosis present

## 2014-07-29 DIAGNOSIS — R29898 Other symptoms and signs involving the musculoskeletal system: Secondary | ICD-10-CM

## 2014-07-29 DIAGNOSIS — R279 Unspecified lack of coordination: Secondary | ICD-10-CM | POA: Diagnosis present

## 2014-07-29 DIAGNOSIS — G9519 Other vascular myelopathies: Secondary | ICD-10-CM

## 2014-07-29 LAB — BASIC METABOLIC PANEL
Anion gap: 10 (ref 5–15)
BUN: 26 mg/dL — ABNORMAL HIGH (ref 6–23)
CALCIUM: 8.4 mg/dL (ref 8.4–10.5)
CO2: 24 meq/L (ref 19–32)
CREATININE: 0.79 mg/dL (ref 0.50–1.35)
Chloride: 106 mEq/L (ref 96–112)
GFR calc non Af Amer: >90 mL/min (ref >90)
Glucose, Bld: 269 mg/dL — ABNORMAL HIGH (ref 70–99)
Potassium: 4.5 mEq/L (ref 3.7–5.3)
Sodium: 140 mEq/L (ref 137–147)

## 2014-07-29 LAB — COMPREHENSIVE METABOLIC PANEL
ALT: 82 U/L — ABNORMAL HIGH (ref 0–53)
ANION GAP: 12 (ref 5–15)
AST: 98 U/L — ABNORMAL HIGH (ref 0–37)
Albumin: 3.1 g/dL — ABNORMAL LOW (ref 3.5–5.2)
Alkaline Phosphatase: 224 U/L — ABNORMAL HIGH (ref 39–117)
BUN: 30 mg/dL — ABNORMAL HIGH (ref 6–23)
CALCIUM: 9.9 mg/dL (ref 8.4–10.5)
CO2: 26 mEq/L (ref 19–32)
CREATININE: 0.95 mg/dL (ref 0.50–1.35)
Chloride: 100 mEq/L (ref 96–112)
GFR calc non Af Amer: >90 mL/min (ref >90)
GLUCOSE: 303 mg/dL — AB (ref 70–99)
Potassium: 5.5 mEq/L — ABNORMAL HIGH (ref 3.7–5.3)
Sodium: 138 mEq/L (ref 137–147)
Total Bilirubin: 0.3 mg/dL (ref 0.3–1.2)
Total Protein: 8 g/dL (ref 6.0–8.3)

## 2014-07-29 LAB — URINALYSIS, ROUTINE W REFLEX MICROSCOPIC
Bilirubin Urine: NEGATIVE
GLUCOSE, UA: 500 mg/dL — AB
KETONES UR: NEGATIVE mg/dL
LEUKOCYTES UA: NEGATIVE
NITRITE: NEGATIVE
PH: 6.5 (ref 5.0–8.0)
Protein, ur: NEGATIVE mg/dL
SPECIFIC GRAVITY, URINE: 1.012 (ref 1.005–1.030)
Urobilinogen, UA: 0.2 mg/dL (ref 0.0–1.0)

## 2014-07-29 LAB — CBC WITH DIFFERENTIAL/PLATELET
BASOS PCT: 1 % (ref 0–1)
Basophils Absolute: 0.1 10*3/uL (ref 0.0–0.1)
Eosinophils Absolute: 0.5 10*3/uL (ref 0.0–0.7)
Eosinophils Relative: 5 % (ref 0–5)
HCT: 34.1 % — ABNORMAL LOW (ref 39.0–52.0)
Hemoglobin: 10.8 g/dL — ABNORMAL LOW (ref 13.0–17.0)
LYMPHS PCT: 32 % (ref 12–46)
Lymphs Abs: 3.7 10*3/uL (ref 0.7–4.0)
MCH: 25.1 pg — ABNORMAL LOW (ref 26.0–34.0)
MCHC: 31.7 g/dL (ref 30.0–36.0)
MCV: 79.1 fL (ref 78.0–100.0)
MONOS PCT: 11 % (ref 3–12)
Monocytes Absolute: 1.2 10*3/uL — ABNORMAL HIGH (ref 0.1–1.0)
NEUTROS ABS: 6 10*3/uL (ref 1.7–7.7)
Neutrophils Relative %: 51 % (ref 43–77)
Platelets: 452 10*3/uL — ABNORMAL HIGH (ref 150–400)
RBC: 4.31 MIL/uL (ref 4.22–5.81)
RDW: 17 % — ABNORMAL HIGH (ref 11.5–15.5)
WBC: 11.5 10*3/uL — AB (ref 4.0–10.5)

## 2014-07-29 LAB — URINE MICROSCOPIC-ADD ON

## 2014-07-29 LAB — POC OCCULT BLOOD, ED: FECAL OCCULT BLD: NEGATIVE

## 2014-07-29 MED ORDER — SODIUM CHLORIDE 0.9 % IV SOLN: 2.0000 g | Freq: Once | INTRAVENOUS | Status: DC

## 2014-07-29 MED ORDER — FUROSEMIDE 20 MG PO TABS: 40.0000 mg | ORAL_TABLET | Freq: Every day | ORAL | Status: DC | PRN

## 2014-07-29 MED ORDER — HYDROMORPHONE HCL 1 MG/ML IJ SOLN
1.0000 mg | Freq: Once | INTRAMUSCULAR | Status: AC
  Administered 2014-07-29: 1 mg via INTRAVENOUS
  Filled 2014-07-29: qty 1

## 2014-07-29 MED ORDER — MORPHINE SULFATE 4 MG/ML IJ SOLN
4.0000 mg | Freq: Once | INTRAMUSCULAR | Status: AC
  Administered 2014-07-29: 4 mg via INTRAVENOUS
  Filled 2014-07-29: qty 1

## 2014-07-29 MED ORDER — SODIUM CHLORIDE 0.9 % IV SOLN
1.0000 g | Freq: Once | INTRAVENOUS | Status: AC
  Administered 2014-07-29: 1 g via INTRAVENOUS
  Filled 2014-07-29: qty 10

## 2014-07-29 MED ORDER — INSULIN ASPART 100 UNIT/ML ~~LOC~~ SOLN
8.0000 [IU] | Freq: Once | SUBCUTANEOUS | Status: AC
  Administered 2014-07-29: 8 [IU] via SUBCUTANEOUS
  Filled 2014-07-29: qty 1

## 2014-07-29 MED ORDER — LORATADINE 10 MG PO TABS: 10.0000 mg | ORAL_TABLET | Freq: Two times a day (BID) | ORAL | Status: DC | PRN

## 2014-07-29 MED ORDER — OXYCODONE-ACETAMINOPHEN 5-325 MG PO TABS: 2.0000 | ORAL_TABLET | Freq: Once | ORAL | Status: DC

## 2014-07-29 MED ORDER — INSULIN ASPART 100 UNIT/ML ~~LOC~~ SOLN: 5.0000 [IU] | Freq: Once | SUBCUTANEOUS | Status: DC

## 2014-07-29 MED ORDER — DEXTROSE 50 % IV SOLN: 25.0000 mL | INTRAVENOUS | Status: DC | PRN

## 2014-07-29 MED ORDER — ATORVASTATIN CALCIUM 20 MG PO TABS: 20.0000 mg | ORAL_TABLET | Freq: Every day | ORAL | Status: DC

## 2014-07-29 NOTE — Telephone Encounter (Signed)
This was forwarded to the wrong office

## 2014-07-29 NOTE — ED Notes (Signed)
Patient was at rehab today for previous cva, staff took patient's vitals prior to exercise beginning and found patient blood pressure abnormally high compare to recent visits, patient with no c/o and states he feels no different, rehab center wanted patient evaluated

## 2014-07-29 NOTE — Telephone Encounter (Signed)
Noted  

## 2014-07-29 NOTE — Telephone Encounter (Signed)
Patient Information:  Caller Name: Jason [name]  Phone: [phone]  Patient: Jason [name], Jason [name]  Gender: Male  DOB: Mar 02, 1986  Age: 28 Years  PCP: [name])  Office Follow Up:  Does the office need to follow up with this patient?: No  Instructions For The Office: N/A  RN Note:  Mother instructed to call 911 or take to the ED if has transpotation.  Symptoms  Reason For Call & Symptoms: Jason [name] is a Stroke pt. Just discharged on 07/18/14. At 02:00, c/o weakness in the left arm. He could not lift the arm. Had therapy today. BP in Left.arm 145/101. Rt. arm  was 154/107. Recently started on Atorvastatin. Mother states BP is normally low.  Reviewed Health History In EMR: Yes  Reviewed Medications In EMR: Yes  Reviewed Allergies In EMR: Yes  Reviewed Surgeries / Procedures: Yes  Date of Onset of Symptoms: 07/29/2014  Guideline(s) Used:  Neurologic Deficit  Disposition Per Guideline:   Call EMS 911 Now  Reason For Disposition Reached:   New neurologic deficit that is present NOW, sudden onset of ANY of the following:   Weakness of the face, arm, or leg on one side of the body  Numbness of the face, arm, or leg on one side of the body  Loss of speech or garbled speech  Advice Given:  Call Back If:  You become worse.  Patient Will Follow Care Advice:  YES

## 2014-07-29 NOTE — H&P (Addendum)
[facility] History and Physical  ROMANO STIGGER TGG:[id number] DOB: Dec 07, 1985 DOA: 07/29/2014  Referring physician: ED physician PCP: [name], MD  Specialists:   Chief Complaint: left arm weakness  HPI: Jason [name] is a 28 y.o. male with history of diabetes mellitus1, spinal cord infarct with tetraplegia, gastroparesis, asthma, who presents with left arm weakness.  Patient has weakness over both hands and legs, but no significant weakness over his arms.  At about 2:00 AM, he had a transient, but clear episode of left arm weakness. He states that he could not raise his left arm above his head which he could normally do. His weakness resolved spontaneously when he woke up in the morning. He denies other symptoms, such as fever, chills, chest pain, shortness of breath, nausea, no vomiting, abdominal pain, diarrhea.   Patient also reports that his bp was elevated at about 174/104 yesterday. He states that his blood pressure normally running low. He is currently taking Lasix for his chronic bilateral leg edema. He states that he did not missed any doses of Lasix.  He did admit that he missed doses of other medications, including pain medications, and his back pain is worse than his baseline.  In ED, CT head is negative for acute abnormalities. He was found to have hyperkalemia with a potassium of 5.5 on admission. Elevated transaminases with AST 98, ALT 82. He is admitted to inpatient for further evaluation and treatment  Review of Systems: As presented in the history of presenting illness, rest negative.  Where does patient live?  Lives with parents at home Can patient participate in ADLs? none  Allergy:  Allergies  Allergen Reactions  . Cefuroxime Axetil Anaphylaxis  . Penicillins Anaphylaxis    ?can take amoxicillin?  [name] [Benzonatate] Anaphylaxis  . Shellfish Allergy Itching    Took benadryl to alleviate reaction    Past Medical History  Diagnosis Date   . GERD (gastroesophageal reflux disease)   . Asthma   . Hx MRSA infection     on face  . Gastroparesis   . Diabetic neuropathy   . Seizures   . Stroke     spinal stroke in 4/15  . Diabetes mellitus     sees Dr. Loanne Drilling     Past Surgical History  Procedure Laterality Date  . Tonsillectomy      Social History:  reports that he has been smoking Cigarettes.  He has been smoking about 0.00 packs per day. He has never used smokeless tobacco. He reports that he does not drink alcohol or use illicit drugs.  Family History:  Family History  Problem Relation Age of Onset  . Diabetes Father   . Hypertension Father   . Asthma      fhx  . Hypertension      fhx  . Stroke      fhx  . Heart disease Mother      Prior to Admission medications   Medication Sig Start Date End Date Taking? Authorizing Provider  albuterol (PROVENTIL) (2.5 MG/3ML) 0.083% nebulizer solution Take 3 mLs (2.5 mg total) by nebulization every 4 (four) hours as needed for wheezing or shortness of breath. 04/27/14  Yes [name], MD  atorvastatin (LIPITOR) 20 MG tablet Take 1 tablet (20 mg total) by mouth daily. 07/20/14  Yes Allie Bossier, MD  baclofen (LIORESAL) 10 MG tablet Take 10-20 mg by mouth 3 (three) times daily. Takes 10mg  in morning and lunchtime and takes 20mg  at bedtime  Yes Historical Provider, MD  dexlansoprazole (DEXILANT) 60 MG capsule Take 60 mg by mouth daily.   Yes Historical Provider, MD  diclofenac sodium (VOLTAREN) 1 % GEL Apply 2 g topically daily as needed (for pain).   Yes Historical Provider, MD  diphenoxylate-atropine (LOMOTIL) 2.5-0.025 MG per tablet Take 1 tablet by mouth 2 (two) times daily. 07/21/14  Yes Willia Craze, NP  fluconazole (DIFLUCAN) 200 MG tablet Take 1 tablet (200 mg total) by mouth daily. 07/21/14  Yes Allie Bossier, MD  furosemide (LASIX) 40 MG tablet Take 40 mg by mouth daily as needed for fluid.   Yes Historical Provider, MD  insulin aspart (NOVOLOG) 100  UNIT/ML injection Inject 5 Units into the skin 3 (three) times daily with meals. 07/21/14  Yes Renato Shin, MD  insulin glargine (LANTUS) 100 UNIT/ML injection Inject 0.22 mLs (22 Units total) into the skin at bedtime. 07/21/14  Yes Renato Shin, MD  loratadine (CLARITIN) 10 MG tablet Take 10 mg by mouth 2 (two) times daily as needed for allergies.   Yes Historical Provider, MD  metoCLOPramide (REGLAN) 5 MG tablet Take 1 tablet (5 mg total) by mouth 2 (two) times a week. And as needed for nausea 07/01/14  Yes Meredith Staggers, MD  morphine (MS CONTIN) 30 MG 12 hr tablet Take 1 tablet (30 mg total) by mouth every 12 (twelve) hours. 07/16/14  Yes Meredith Staggers, MD  oxyCODONE-acetaminophen (PERCOCET) 10-325 MG per tablet Take 1 tablet by mouth every 8 (eight) hours as needed for pain. 07/16/14  Yes Meredith Staggers, MD  pregabalin (LYRICA) 100 MG capsule Take 100-200 mg by mouth 4 (four) times daily. Take 100 mg by mouth in the morning, take 100 mg by mouth in the afternoon, take 100 mg by mouth in the evening and take 200 mg by mouth at bedtime.   Yes Historical Provider, MD  silver sulfADIAZINE (SILVADENE) 1 % cream Apply 1 application topically 2 (two) times a week. Tuesday and thursday   Yes Historical Provider, MD  glucagon (GLUCAGON EMERGENCY) 1 MG injection Inject 1 mg into the vein once as needed. 04/20/14   [name], MD  glucose blood (ACCU-CHEK AVIVA PLUS) test strip Use to check blood sugar 4 times per day 07/21/14   Renato Shin, MD    Physical Exam: Filed Vitals:   07/29/14 2349 07/30/14 0000 07/30/14 0117 07/30/14 0145  BP: 114/77 110/77 101/70 116/77  Pulse: 87 86 86 85  Temp:    99 F (37.2 C)  TempSrc:    Oral  Resp: 12 13 16 18   Weight:      SpO2: 99% 98% 98% 99%   General: Not in acute distress HEENT:       Eyes: PERRL, EOMI, no scleral icterus       ENT: No discharge from the ears and nose, no pharynx injection, no tonsillar enlargement.        Neck: No JVD, no  bruit, no mass felt. Cardiac: S1/S2, RRR, No murmurs, gallops or rubs Pulm: Good air movement bilaterally. Clear to auscultation bilaterally. No rales, wheezing, rhonchi or rubs. Abd: Soft, nondistended, nontender, no rebound pain, no organomegaly, BS present Ext: No edema. 2+DP/PT pulse bilaterally Musculoskeletal: No joint deformities, erythema, or stiffness, ROM full Skin: No rashes.  Neuro: Alert and oriented X3, cranial nerves II-XII grossly intact, muscle strength 5/5 in all extremeties, sensation to light touch intact. Brachial reflex 2+ bilaterally. Knee reflex 1+ bilaterally. Negative Babinski's sign. Normal  finger to nose test. Psych: Patient is not psychotic, no suicidal or hemocidal ideation.  Labs on Admission:  Basic Metabolic Panel:  Recent Labs Lab 07/29/14 1730 07/29/14 2231  NA 138 140  K 5.5* 4.5  CL 100 106  CO2 26 24  GLUCOSE 303* 269*  BUN 30* 26*  CREATININE 0.95 0.79  CALCIUM 9.9 8.4   Liver Function Tests:  Recent Labs Lab 07/29/14 1730  AST 98*  ALT 82*  ALKPHOS 224*  BILITOT 0.3  PROT 8.0  ALBUMIN 3.1*   No results found for this basename: LIPASE, AMYLASE,  in the last 168 hours No results found for this basename: AMMONIA,  in the last 168 hours CBC:  Recent Labs Lab 07/29/14 1730  WBC 11.5*  NEUTROABS 6.0  HGB 10.8*  HCT 34.1*  MCV 79.1  PLT 452*   Cardiac Enzymes: No results found for this basename: CKTOTAL, CKMB, CKMBINDEX, TROPONINI,  in the last 168 hours  BNP (last 3 results)  Recent Labs  09/28/13 0430 10/02/13 0445 01/29/14 2113  PROBNP 673.0* 2451.0* 1343.0*   CBG:  Recent Labs Lab 07/30/14 0116  GLUCAP 233*    Radiological Exams on Admission: Dg Chest 2 View  07/29/2014   CLINICAL DATA:  Hypotension, history of stroke, left arm weakness  EXAM: CHEST  2 VIEW  COMPARISON:  07/18/2014  FINDINGS: Cardiomediastinal silhouette is stable. No acute infiltrate or pleural effusion. No pulmonary edema. Stable old  fracture deformity distal left clavicle.  IMPRESSION: No active cardiopulmonary disease.   Electronically Signed   By: Lahoma Crocker M.D.   On: 07/29/2014 17:54   Ct Head Wo Contrast  07/29/2014   CLINICAL DATA:  Hypertension.  Previous stroke.  EXAM: CT HEAD WITHOUT CONTRAST  TECHNIQUE: Contiguous axial images were obtained from the base of the skull through the vertex without intravenous contrast.  COMPARISON:  04/27/2014.  Brain MR dated 02/04/2014.  FINDINGS: Previously noted Chiari 1 malformation. Otherwise, normal appearing cerebral hemispheres and posterior fossa structures. Normal size and position of the ventricles. No intracranial hemorrhage, mass lesion or CT evidence of acute infarction. Unremarkable bones and included paranasal sinuses.  IMPRESSION: No acute abnormality.  Previously noted Chiari 1 malformation.   Electronically Signed   By: Enrique Sack M.D.   On: 07/29/2014 20:57    EKG: Independently reviewed.   Assessment/Plan Principal Problem:   Hyperkalemia Active Problems:   Type 1 diabetes, uncontrolled, with neuropathy   GERD   IDDM (insulin dependent diabetes mellitus)   Spinal cord infarction (history of)  1. hyperkalemia: Potassium 5.5 on admission. Stat EKG has no change. Repeated BMP showed potassium 4.5. Therefore, no further treatment needed  2. Elevated blood pressure:  Patient does not carry diagnoses of hypertension. Actually his blood pressure was elevated when I saw him last time in hospital  due to [place] on 07/18/12. His elevated blood pressure is likely due to pain secondary to noncompliance to his pain medications. It is also possible that he has undiagnosed hypertension. - will watch his bp closely - if persists, may add ACEI for renal protection given his DM-I    3. DM-I: Last A1c was 9.0 on 05/28/14. -Continue home Lantus 20 units daily, -SSI  4. Spinal cord infarct with tetraplegia:  - continue MS Contin - will switch Percocet to oxycodone given his  elevated transaminases  5. GERD: PPi  6.  Elevated transaminases: Thank you etiology. His previous hepatitis paneal was negative on 02/02/14. It may be related to  lipitor and acetaminophen use - avoid tylenol - hold lipitor - repeat hepatitis panel  7. transient left arm weakness: Etiology is not clear. Ed physician discussed his symptoms with Dr. Nicole Kindred (neurology) who felt that symptoms were atypical of CVA/TIA (given lack of facial weakness, slurred speech, or difficulty swallowing). It is possible that he compressed his left arm during sleeping given he has tetraplegia.  -will not order any image now -watch to see whether reoccur or not.   DVT ppx: SQ Heparin  Code Status: Full code Family Communication:  Yes, patient's  parents   at bed side Disposition Plan: Admit to inpatient   Date of Service 07/30/2014    Ivor Costa [facility] Pager [phone]  If 7PM-7AM, please contact night-coverage [email or web address] Password [facility] 07/30/2014, 3:43 AM

## 2014-07-29 NOTE — Telephone Encounter (Signed)
Physical Therapist called and states:  Pt had arm weakness last night where he could not lift his left arm and this is a change from where it use to be.   Physical Therapist checked pt's blood pressure and in his left arm 145/101 and right arm 154/107 today.  PT had just done a few theraband exercises. Advised that pt needed an appt today.

## 2014-07-29 NOTE — ED Provider Notes (Signed)
Medical screening examination/treatment/procedure(s) were conducted as a shared visit with non-physician practitioner(s) and myself.  I personally evaluated the patient during the encounter.   EKG Interpretation   Date/Time:  Thursday July 29 2014 17:55:44 EDT Ventricular Rate:  86 PR Interval:  117 QRS Duration: 76 QT Interval:  346 QTC Calculation: 414 R Axis:   42 Text Interpretation:  Sinus rhythm Borderline short PR interval No  significant change was found Confirmed by [facility]  MD, TREY ([id number]) on  07/29/2014 6:36:31 PM      28 yo male with history of c6 Spinal cord infarction about 6 months ago who presents with hypertension noted while at rehab.  He usually has problems with hypotension.  He also reports that at 2am last night he had a transient, but clear episode of left arm weakness.  He normally has BUE grip weakness, but he is normally able to raise his arms above his head.  He could not do that on the left at 2am.  He went to sleep and woke up back to his normal self.  On exam, well appearing, nontoxic, not distressed, normal respiratory effort, normal perfusion, CN II-XII intact, decreased strength in BUE, but able to raise arms over head, abdomen soft and nontender.    10:28 PM CT head negative.  Labs show mild hyperkalemia and mildly elevated LFTs.  He is also hyperglycemic, likely around his baseline.  I discussed his symptoms with Dr. Nicole Kindred (neurology) who felt that symptoms were atypical of CVA/TIA (given lack of facial weakness, slurred speech, or difficulty swallowing.  Pt and mother denied these symptoms).  However, given constellation of symptoms and findings, have consulted internal medicine for admission and further workup.    Clinical Impression: 1. HTN (hypertension)   2. Left arm weakness        Artis Delay, MD 07/29/14 [phone]

## 2014-07-29 NOTE — ED Notes (Signed)
Patient returning from ct at this time, NAD noted.

## 2014-07-29 NOTE — ED Notes (Signed)
Bladder scan results:  Maximum fluid found 17 ml, avg, 14 ml x 5 scans

## 2014-07-29 NOTE — ED Provider Notes (Signed)
CSN: [id number]     Arrival date & time 07/29/14  1549 History   First MD Initiated Contact with Patient 07/29/14 1606     Chief Complaint  Patient presents with  . Hypertension     (Consider location/radiation/quality/duration/timing/severity/associated sxs/prior Treatment) HPI Jason [name] is a 28 y.o. male history of CVA--reported spinal infarct at level of C6-- in April of 2015, type 1 diabetes comes in for evaluation of high blood pressure. Patient states he was at physical therapy today when his physical therapist noted that blood pressure in his left arm was 782 systolic and blood pressure in his right arm was 956 systolic and recommended further evaluation in ED. Patient is accompanied by his mother who states this morning at 2:00 AM he was unable to raise his left arm above his head which is a new weakness for him. Deficit has since resolved. Deficits from his previous CVA include bilateral grip strength weakness, motor weakness in both lower extremities, but sensation is still intact. No facial droop cranial nerves II through XII grossly intact. Patient denies any fevers, headaches, changes in vision, chest pain, abdominal pain, changes in urination. Patient states "I feel fine, just hungry". Patient's blood pressure in the room 154/101. Of note, pt reports having eaten a lot of beef, broccoli and chicken from the local [employer].     Past Medical History  Diagnosis Date  . GERD (gastroesophageal reflux disease)   . Asthma   . Hx MRSA infection     on face  . Gastroparesis   . Diabetic neuropathy   . Seizures   . Stroke     spinal stroke in 4/15  . Diabetes mellitus     sees Dr. Loanne Drilling    Past Surgical History  Procedure Laterality Date  . Tonsillectomy     Family History  Problem Relation Age of Onset  . Diabetes Father   . Hypertension Father   . Asthma      fhx  . Hypertension      fhx  . Stroke      fhx  . Heart disease Mother    History   Substance Use Topics  . Smoking status: Current Every Day Smoker    Types: Cigarettes    Last Attempt to Quit: 01/29/2014  . Smokeless tobacco: Never Used     Comment: e-cigarette  . Alcohol Use: No     Comment: rarely    Review of Systems  Constitutional: Negative for fever.  HENT: Negative for sore throat.   Eyes: Negative for visual disturbance.  Respiratory: Negative for shortness of breath.   Cardiovascular: Negative for chest pain.       High blood pressure  Gastrointestinal: Negative for abdominal pain.  Endocrine: Negative for polyuria.  Genitourinary: Negative for dysuria.  Skin: Negative for rash.  Neurological: Negative for headaches.      Allergies  Cefuroxime axetil; Penicillins; Tessalon; and Shellfish allergy  Home Medications   Prior to Admission medications   Medication Sig Start Date End Date Taking? Authorizing Provider  albuterol (PROVENTIL) (2.5 MG/3ML) 0.083% nebulizer solution Take 3 mLs (2.5 mg total) by nebulization every 4 (four) hours as needed for wheezing or shortness of breath. 04/27/14   [name], MD  atorvastatin (LIPITOR) 20 MG tablet Take 1 tablet (20 mg total) by mouth daily. 07/20/14   Allie Bossier, MD  baclofen (LIORESAL) 10 MG tablet Take 10-20 mg by mouth 3 (three) times daily. Takes 10mg  in morning  and lunchtime and takes 20mg  at bedtime    Historical Provider, MD  dexlansoprazole (DEXILANT) 60 MG capsule Take 60 mg by mouth daily.    Historical Provider, MD  diclofenac sodium (VOLTAREN) 1 % GEL Apply 2 g topically daily as needed (for pain).    Historical Provider, MD  diphenoxylate-atropine (LOMOTIL) 2.5-0.025 MG per tablet Take 1 tablet by mouth 2 (two) times daily. 07/21/14   Willia Craze, NP  fluconazole (DIFLUCAN) 200 MG tablet Take 1 tablet (200 mg total) by mouth daily. 07/21/14   Allie Bossier, MD  furosemide (LASIX) 40 MG tablet Take 40 mg by mouth daily as needed for fluid.    Historical Provider, MD  glucagon  (GLUCAGON EMERGENCY) 1 MG injection Inject 1 mg into the vein once as needed. 04/20/14   [name], MD  glucose blood (ACCU-CHEK AVIVA PLUS) test strip Use to check blood sugar 4 times per day 07/21/14   Renato Shin, MD  insulin aspart (NOVOLOG) 100 UNIT/ML injection Inject 5 Units into the skin 3 (three) times daily with meals. 07/21/14   Renato Shin, MD  insulin glargine (LANTUS) 100 UNIT/ML injection Inject 0.22 mLs (22 Units total) into the skin at bedtime. 07/21/14   Renato Shin, MD  loratadine (CLARITIN) 10 MG tablet Take 10 mg by mouth 2 (two) times daily as needed for allergies.    Historical Provider, MD  metoCLOPramide (REGLAN) 5 MG tablet Take 1 tablet (5 mg total) by mouth 2 (two) times a week. And as needed for nausea 07/01/14   Meredith Staggers, MD  morphine (MS CONTIN) 30 MG 12 hr tablet Take 1 tablet (30 mg total) by mouth every 12 (twelve) hours. 07/16/14   Meredith Staggers, MD  oxyCODONE-acetaminophen (PERCOCET) 10-325 MG per tablet Take 1 tablet by mouth every 8 (eight) hours as needed for pain. 07/16/14   Meredith Staggers, MD  pregabalin (LYRICA) 100 MG capsule Take 100-200 mg by mouth 4 (four) times daily. Take 100 mg by mouth in the morning, take 100 mg by mouth in the afternoon, take 100 mg by mouth in the evening and take 200 mg by mouth at bedtime.    Historical Provider, MD  silver sulfADIAZINE (SILVADENE) 1 % cream Apply 1 application topically 2 (two) times a week. Tuesday and thursday    Historical Provider, MD   BP 175/107  Pulse 89  Temp(Src) 97.7 F (36.5 C) (Oral)  Resp 16  Wt 145 lb (65.772 kg)  SpO2 100% Physical Exam  Nursing note and vitals reviewed. Constitutional: He is oriented to person, place, and time. He appears well-developed and well-nourished.  HENT:  Head: Normocephalic and atraumatic.  Mouth/Throat: Oropharynx is clear and moist.  Eyes: Conjunctivae are normal. Pupils are equal, round, and reactive to light. Right eye exhibits no discharge.  Left eye exhibits no discharge. No scleral icterus.  Neck: Neck supple.  Cardiovascular: Normal rate, regular rhythm and normal heart sounds.   Pulmonary/Chest: Effort normal and breath sounds normal. No respiratory distress. He has no wheezes. He has no rales.  Abdominal: Soft. There is no tenderness.  Genitourinary:  Reduced sphincter tone with no evidence of fecal impaction.  Musculoskeletal: He exhibits no tenderness.  Neurological: He is alert and oriented to person, place, and time.  Cranial Nerves II-XII grossly intact. No new focal neurological deficits. Patient is at baseline per himself and family.  Skin: Skin is warm and dry. No rash noted.  Psychiatric: He has a normal  mood and affect.    ED Course  Procedures (including critical care time) Labs Review Labs Reviewed - No data to display  Imaging Review No results found.   EKG Interpretation None      MDM  Vitals stable  -afebrile.  Pt resting comfortably in ED. Pain managed in ED  Dr. Doy Mince was immediately involved in patient care. Neurology was consulted. They had low concern for Autonomic Dysreflexia PE not concerning for acute or emergent pathology. Catheter is draining urine with no apparent complications, DRE reveals no impaction. Decreased sphincter tone likely sequela from previous spinal infarct as he has had previously documented flaccid muscle tone. Labwork shows hyperkalemia (recheck 4.5), mildly elevated LFTs, bladder scan showed avg of 84mLs. CT head showed no acute pathology. Discussed case with Dr. Doy Mince and due to pts PMH, recent weakness and unidentifiable cause of HTN as well as abnormal lab values, will admit to hospital for further evaluation and management. Discussed plan for admission with pt and family, amenable.  Final diagnoses:  HTN (hypertension)  Left arm weakness        Verl Dicker, PA-C 07/30/14 0003

## 2014-07-29 NOTE — ED Notes (Signed)
Patient to ct at this time

## 2014-07-29 NOTE — Telephone Encounter (Signed)
Caller: [name]/Mother; Phone: [phone]; Reason for Call: Patient was at [facility]; blood pressure elevated; [facility] will call EMS to transport pt to ED and [facility] will use their [facility] to transport the patient's motorized wheelchair for the family.  Patients mother is grateful for the office's assistance. (NOTE: this information was also provided verbally to [place] at the front desk.)

## 2014-07-30 ENCOUNTER — Encounter (HOSPITAL_COMMUNITY): Payer: Self-pay | Admitting: *Deleted

## 2014-07-30 ENCOUNTER — Telehealth: Payer: Self-pay | Admitting: Family Medicine

## 2014-07-30 DIAGNOSIS — R740 Nonspecific elevation of levels of transaminase and lactic acid dehydrogenase [LDH]: Secondary | ICD-10-CM

## 2014-07-30 DIAGNOSIS — I10 Essential (primary) hypertension: Secondary | ICD-10-CM

## 2014-07-30 DIAGNOSIS — R29898 Other symptoms and signs involving the musculoskeletal system: Secondary | ICD-10-CM

## 2014-07-30 DIAGNOSIS — R7401 Elevation of levels of liver transaminase levels: Secondary | ICD-10-CM | POA: Diagnosis present

## 2014-07-30 DIAGNOSIS — E875 Hyperkalemia: Secondary | ICD-10-CM

## 2014-07-30 LAB — HEPATITIS PANEL, ACUTE
HCV Ab: NEGATIVE
HCV Ab: NEGATIVE
HEP A IGM: NONREACTIVE
HEP B C IGM: NONREACTIVE
HEP B C IGM: NONREACTIVE
Hep A IgM: NONREACTIVE
Hepatitis B Surface Ag: NEGATIVE
Hepatitis B Surface Ag: NEGATIVE

## 2014-07-30 LAB — RAPID URINE DRUG SCREEN, HOSP PERFORMED
Amphetamines: NOT DETECTED
BARBITURATES: NOT DETECTED
Benzodiazepines: NOT DETECTED
COCAINE: NOT DETECTED
OPIATES: POSITIVE — AB
Tetrahydrocannabinol: NOT DETECTED

## 2014-07-30 LAB — MRSA PCR SCREENING: MRSA by PCR: NEGATIVE

## 2014-07-30 LAB — COMPREHENSIVE METABOLIC PANEL
ALBUMIN: 3 g/dL — AB (ref 3.5–5.2)
ALT: 93 U/L — AB (ref 0–53)
AST: 204 U/L — AB (ref 0–37)
Alkaline Phosphatase: 229 U/L — ABNORMAL HIGH (ref 39–117)
Anion gap: 12 (ref 5–15)
BUN: 26 mg/dL — ABNORMAL HIGH (ref 6–23)
CALCIUM: 10 mg/dL (ref 8.4–10.5)
CO2: 26 meq/L (ref 19–32)
Chloride: 102 mEq/L (ref 96–112)
Creatinine, Ser: 0.9 mg/dL (ref 0.50–1.35)
GFR calc Af Amer: >90 mL/min (ref >90)
Glucose, Bld: 394 mg/dL — ABNORMAL HIGH (ref 70–99)
Potassium: 5 mEq/L (ref 3.7–5.3)
SODIUM: 140 meq/L (ref 137–147)
TOTAL PROTEIN: 7.5 g/dL (ref 6.0–8.3)
Total Bilirubin: 0.3 mg/dL (ref 0.3–1.2)

## 2014-07-30 LAB — GLUCOSE, CAPILLARY
GLUCOSE-CAPILLARY: 349 mg/dL — AB (ref 70–99)
GLUCOSE-CAPILLARY: 337 mg/dL — AB (ref 70–99)

## 2014-07-30 LAB — CBC
HEMATOCRIT: 31.1 % — AB (ref 39.0–52.0)
HEMOGLOBIN: 9.8 g/dL — AB (ref 13.0–17.0)
MCH: 25.7 pg — AB (ref 26.0–34.0)
MCHC: 31.5 g/dL (ref 30.0–36.0)
MCV: 81.4 fL (ref 78.0–100.0)
Platelets: 411 10*3/uL — ABNORMAL HIGH (ref 150–400)
RBC: 3.82 MIL/uL — AB (ref 4.22–5.81)
RDW: 16.9 % — ABNORMAL HIGH (ref 11.5–15.5)
WBC: 9.5 10*3/uL (ref 4.0–10.5)

## 2014-07-30 LAB — CBG MONITORING, ED: GLUCOSE-CAPILLARY: 233 mg/dL — AB (ref 70–99)

## 2014-07-30 LAB — TROPONIN I: Troponin I: <0.3 ng/mL (ref <0.30)

## 2014-07-30 MED ORDER — INSULIN GLARGINE 100 UNIT/ML ~~LOC~~ SOLN
20.0000 [IU] | Freq: Every day | SUBCUTANEOUS | Status: DC
  Filled 2014-07-30: qty 0.2

## 2014-07-30 MED ORDER — ALBUTEROL SULFATE (2.5 MG/3ML) 0.083% IN NEBU: 2.5000 mg | INHALATION_SOLUTION | RESPIRATORY_TRACT | Status: DC | PRN

## 2014-07-30 MED ORDER — MORPHINE SULFATE ER 15 MG PO TBCR
30.0000 mg | EXTENDED_RELEASE_TABLET | Freq: Two times a day (BID) | ORAL | Status: DC
Start: 2014-07-30 — End: 2014-07-30
  Administered 2014-07-30 (×2): 30 mg via ORAL
  Filled 2014-07-30 (×2): qty 2

## 2014-07-30 MED ORDER — FUROSEMIDE 40 MG PO TABS: 20.0000 mg | ORAL_TABLET | Freq: Every day | ORAL | Status: DC | PRN

## 2014-07-30 MED ORDER — FUROSEMIDE 40 MG PO TABS: 40.0000 mg | ORAL_TABLET | Freq: Every day | ORAL | Status: DC | PRN

## 2014-07-30 MED ORDER — PANTOPRAZOLE SODIUM 40 MG PO TBEC
40.0000 mg | DELAYED_RELEASE_TABLET | Freq: Every day | ORAL | Status: DC
  Administered 2014-07-30: 40 mg via ORAL
  Filled 2014-07-30: qty 1

## 2014-07-30 MED ORDER — GLUCAGON (RDNA) 1 MG IJ KIT
1.0000 mg | PACK | Freq: Once | INTRAMUSCULAR | Status: DC | PRN
  Filled 2014-07-30: qty 1

## 2014-07-30 MED ORDER — OXYCODONE HCL 5 MG PO TABS
10.0000 mg | ORAL_TABLET | Freq: Three times a day (TID) | ORAL | Status: DC | PRN
  Administered 2014-07-30: 10 mg via ORAL
  Filled 2014-07-30: qty 2

## 2014-07-30 MED ORDER — PREGABALIN 100 MG PO CAPS: 200.0000 mg | ORAL_CAPSULE | Freq: Every day | ORAL | Status: DC

## 2014-07-30 MED ORDER — BACLOFEN 20 MG PO TABS: 20.0000 mg | ORAL_TABLET | Freq: Every day | ORAL | Status: DC

## 2014-07-30 MED ORDER — INSULIN ASPART 100 UNIT/ML ~~LOC~~ SOLN
0.0000 [IU] | Freq: Three times a day (TID) | SUBCUTANEOUS | Status: DC
  Administered 2014-07-30: 7 [IU] via SUBCUTANEOUS

## 2014-07-30 MED ORDER — DIPHENOXYLATE-ATROPINE 2.5-0.025 MG PO TABS
1.0000 | ORAL_TABLET | Freq: Two times a day (BID) | ORAL | Status: DC
  Administered 2014-07-30: 1 via ORAL
  Filled 2014-07-30: qty 1

## 2014-07-30 MED ORDER — LORATADINE 10 MG PO TABS: 10.0000 mg | ORAL_TABLET | Freq: Two times a day (BID) | ORAL | Status: DC | PRN

## 2014-07-30 MED ORDER — METOCLOPRAMIDE HCL 5 MG PO TABS: 5.0000 mg | ORAL_TABLET | ORAL | Status: DC

## 2014-07-30 MED ORDER — BACLOFEN 10 MG PO TABS
10.0000 mg | ORAL_TABLET | ORAL | Status: DC
  Administered 2014-07-30: 10 mg via ORAL
  Filled 2014-07-30: qty 1

## 2014-07-30 MED ORDER — HEPARIN SODIUM (PORCINE) 5000 UNIT/ML IJ SOLN
5000.0000 [IU] | Freq: Three times a day (TID) | INTRAMUSCULAR | Status: DC
  Administered 2014-07-30: 5000 [IU] via SUBCUTANEOUS
  Filled 2014-07-30: qty 1

## 2014-07-30 MED ORDER — AMLODIPINE BESYLATE 2.5 MG PO TABS: 2.5000 mg | ORAL_TABLET | Freq: Every day | ORAL | Status: DC

## 2014-07-30 MED ORDER — ATORVASTATIN CALCIUM 20 MG PO TABS: 20.0000 mg | ORAL_TABLET | Freq: Every day | ORAL | Status: DC

## 2014-07-30 MED ORDER — PREGABALIN 100 MG PO CAPS
100.0000 mg | ORAL_CAPSULE | Freq: Three times a day (TID) | ORAL | Status: DC
  Administered 2014-07-30: 100 mg via ORAL
  Filled 2014-07-30: qty 1

## 2014-07-30 NOTE — Telephone Encounter (Signed)
emmi emailed °

## 2014-07-30 NOTE — Discharge Summary (Signed)
PATIENT DETAILS Name: Jason [name] Age: 28 y.o. Sex: male Date of Birth: November 24, 1985 MRN: [id number]. Admitting Physician: Ivor Costa, MD PCP:FRY,STEPHEN A, MD  Admit Date: 07/29/2014 Discharge date: 07/30/2014  Recommendations for Outpatient Follow-up:  1. New medications: Amlodipine 2.5 mg daily-please monitor blood pressure, has a history of autonomic dysfunction with hypotension he did allow some mild permissive hypertension. 2. Please check electrolytes in one week 3. Please repeat LFTs in one week-statin on hold.  PRIMARY DISCHARGE DIAGNOSIS:  Principal Problem:   Hyperkalemia Active Problems:   Type 1 diabetes, uncontrolled, with neuropathy   GERD   IDDM (insulin dependent diabetes mellitus)   Spinal cord infarction (history of)   Elevated transaminase level      PAST MEDICAL HISTORY: Past Medical History  Diagnosis Date  . GERD (gastroesophageal reflux disease)   . Asthma   . Hx MRSA infection     on face  . Gastroparesis   . Diabetic neuropathy   . Seizures   . Stroke     spinal stroke in 4/15  . Diabetes mellitus     sees Dr. Loanne Drilling     DISCHARGE MEDICATIONS:   Medication List    STOP taking these medications       atorvastatin 20 MG tablet  Commonly known as:  LIPITOR      TAKE these medications       albuterol (2.5 MG/3ML) 0.083% nebulizer solution  Commonly known as:  PROVENTIL  Take 3 mLs (2.5 mg total) by nebulization every 4 (four) hours as needed for wheezing or shortness of breath.     amLODipine 2.5 MG tablet  Commonly known as:  NORVASC  Take 1 tablet (2.5 mg total) by mouth daily.     baclofen 10 MG tablet  Commonly known as:  LIORESAL  Take 10-20 mg by mouth 3 (three) times daily. Takes 10mg  in morning and lunchtime and takes 20mg  at bedtime     DEXILANT 60 MG capsule  Generic drug:  dexlansoprazole  Take 60 mg by mouth daily.     diclofenac sodium 1 % Gel  Commonly known as:  VOLTAREN  Apply 2 g topically daily as  needed (for pain).     diphenoxylate-atropine 2.5-0.025 MG per tablet  Commonly known as:  LOMOTIL  Take 1 tablet by mouth 2 (two) times daily.     fluconazole 200 MG tablet  Commonly known as:  DIFLUCAN  Take 1 tablet (200 mg total) by mouth daily.     furosemide 40 MG tablet  Commonly known as:  LASIX  Take 0.5 tablets (20 mg total) by mouth daily as needed for fluid.     glucagon 1 MG injection  Commonly known as:  GLUCAGON EMERGENCY  Inject 1 mg into the vein once as needed.     glucose blood test strip  Commonly known as:  ACCU-CHEK AVIVA PLUS  Use to check blood sugar 4 times per day     insulin aspart 100 UNIT/ML injection  Commonly known as:  novoLOG  Inject 5 Units into the skin 3 (three) times daily with meals.     insulin glargine 100 UNIT/ML injection  Commonly known as:  LANTUS  Inject 0.22 mLs (22 Units total) into the skin at bedtime.     loratadine 10 MG tablet  Commonly known as:  CLARITIN  Take 10 mg by mouth 2 (two) times daily as needed for allergies.     metoCLOPramide 5 MG tablet  Commonly known  as:  REGLAN  Take 1 tablet (5 mg total) by mouth 2 (two) times a week. And as needed for nausea     morphine 30 MG 12 hr tablet  Commonly known as:  MS CONTIN  Take 1 tablet (30 mg total) by mouth every 12 (twelve) hours.     oxyCODONE-acetaminophen 10-325 MG per tablet  Commonly known as:  PERCOCET  Take 1 tablet by mouth every 8 (eight) hours as needed for pain.     pregabalin 100 MG capsule  Commonly known as:  LYRICA  Take 100-200 mg by mouth 4 (four) times daily. Take 100 mg by mouth in the morning, take 100 mg by mouth in the afternoon, take 100 mg by mouth in the evening and take 200 mg by mouth at bedtime.     silver sulfADIAZINE 1 % cream  Commonly known as:  SILVADENE  Apply 1 application topically 2 (two) times a week. Tuesday and thursday        ALLERGIES:   Allergies  Allergen Reactions  . Cefuroxime Axetil Anaphylaxis  .  Penicillins Anaphylaxis    ?can take amoxicillin?  [name] [Benzonatate] Anaphylaxis  . 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  CONSULTATIONS:   None  PERTINENT RADIOLOGIC STUDIES: Dg Chest 2 View  07/29/2014   CLINICAL DATA:  Hypotension, history of stroke, left arm weakness  EXAM: CHEST  2 VIEW  COMPARISON:  07/18/2014  FINDINGS: Cardiomediastinal silhouette is stable. No acute infiltrate or pleural effusion. No pulmonary edema. Stable old fracture deformity distal left clavicle.  IMPRESSION: No active cardiopulmonary disease.   Electronically Signed   By: Lahoma Crocker M.D.   On: 07/29/2014 17:54   Ct Head Wo Contrast  07/29/2014   CLINICAL DATA:  Hypertension.  Previous stroke.  EXAM: CT HEAD WITHOUT CONTRAST  TECHNIQUE: Contiguous axial images were obtained from the base of the skull through the vertex without intravenous contrast.  COMPARISON:  04/27/2014.  Brain MR dated 02/04/2014.  FINDINGS: Previously noted Chiari 1 malformation. Otherwise, normal appearing cerebral hemispheres and posterior fossa structures. Normal size and position of the ventricles. No intracranial hemorrhage, mass lesion or CT evidence of acute infarction. Unremarkable bones and included paranasal sinuses.  IMPRESSION: No acute abnormality.  Previously noted Chiari 1 malformation.   Electronically Signed   By: Enrique Sack M.D.   On: 07/29/2014 20:57     PERTINENT LAB RESULTS: CBC:  Recent Labs  07/29/14 1730  WBC 11.5*  HGB 10.8*  HCT 34.1*  PLT 452*   CMET CMP     Component Value Date/Time   NA 140 07/29/2014 2231   K 4.5 07/29/2014 2231   CL 106 07/29/2014 2231   CO2 24 07/29/2014 2231   GLUCOSE  269* 07/29/2014 2231   BUN 26* 07/29/2014 2231   CREATININE 0.79 07/29/2014 2231   CALCIUM 8.4 07/29/2014 2231   PROT 8.0 07/29/2014 1730   ALBUMIN 3.1* 07/29/2014 1730   AST 98* 07/29/2014 1730   ALT 82* 07/29/2014 1730   ALKPHOS 224* 07/29/2014 1730   BILITOT 0.3 07/29/2014 1730   GFRNONAA >90 07/29/2014 2231   GFRAA >90 07/29/2014 2231    GFR Estimated  Creatinine Clearance: 120.5 ml/min (by C-G formula based on Cr of 0.79). No results found for this basename: LIPASE, AMYLASE,  in the last 72 hours  Recent Labs  07/30/14 0408  TROPONINI <0.30   No components found with this basename: POCBNP,  No results found for this basename: DDIMER,  in the last 72 hours No results found for this basename: HGBA1C,  in the last 72 hours No results found for this basename: CHOL, HDL, LDLCALC, TRIG, CHOLHDL, LDLDIRECT,  in the last 72 hours No results found for this basename: TSH, T4TOTAL, FREET3, T3FREE, THYROIDAB,  in the last 72 hours No results found for this basename: VITAMINB12, FOLATE, FERRITIN, TIBC, IRON, RETICCTPCT,  in the last 72 hours Coags: No results found for this basename: PT, INR,  in the last 72 hours Microbiology: Recent Results (from the past 240 hour(s))  MRSA PCR SCREENING     Status: None   Collection Time    07/30/14  6:35 AM      Result Value Ref Range Status   MRSA by PCR NEGATIVE  NEGATIVE Final   Comment:            The GeneXpert MRSA Assay (FDA     approved for NASAL specimens     only), is one component of a     comprehensive MRSA colonization     surveillance program. It is not     intended to diagnose MRSA     infection nor to guide or     monitor treatment for     MRSA infections.     BRIEF HOSPITAL COURSE:   Principal Problem:  Mild Hyperkalemia - Resolved without any specific treatment. Repeat chemistry panel showed potassium of 4.5.  Active Problems: Hypertension - Start low-dose amlodipine. Will avoid ACEI for now given mild  hyperkalemia on admission. Has a history of autonomic dysfunction with hypo-/hypertension, follow BP closely as outpatient. Suspect will need to allow some amount of permissive hypertension given history of syncope from hypotension.  Transient left arm weakness - Occurred one night prior to admission. It lasted only for a few minutes. Given history of quadriparesis- not able to move lower extremities at all, has movement in arm/forearm-no movement in hand- said that this was likely secondary to compression - as it occurred while sleeping. This spontaneous resolved without treatment, and it has not recurred him I suspect no further treatment is required. ED M.D. spoke with neurology, who advised against further workup. CT of the head was negative.    Type 1 diabetes, uncontrolled, with neuropathy - Stable, resume Lantus.    GERD - Continue PPI  Mildly elevated liver enzymes - Continue to hold Lipitor on discharge. Hepatitis panel negative. Defer further workup to the outpatient setting.    Spinal cord infarction (history of) - Resume outpatient physical therapy services   TODAY-DAY OF DISCHARGE:  Subjective:   [facility] today has no headache,no chest abdominal pain,no new weakness tingling or numbness, feels much better wants to go home today.   Objective:   Blood pressure 154/99, pulse 85, temperature 98.6 F (37 C), temperature source Oral, resp. rate 16, height 5\' 7"  (1.702 m), weight 63.078 kg (139 lb 1 oz), SpO2 98.00%. No intake or output data in the 24 hours ending 07/30/14 1023 Filed Weights   07/29/14 1551 07/30/14 0500  Weight: 65.772 kg (145 lb) 63.078 kg (139 lb 1 oz)    Exam Awake Alert, Oriented *3, No new F.N deficits, Normal affect [place].AT,PERRAL  Supple Neck,No JVD, No cervical lymphadenopathy appriciated.  Symmetrical Chest wall movement, Good air movement bilaterally, CTAB RRR,No Gallops,Rubs or new Murmurs, No Parasternal Heave +ve B.Sounds, Abd Soft, Non  tender, No organomegaly appriciated, No rebound -guarding or rigidity. No Cyanosis, Clubbing or edema, No new Rash or bruise  DISCHARGE CONDITION: Stable  DISPOSITION: Home  DISCHARGE INSTRUCTIONS:    Activity:  As tolerated with Full fall precautions use walker/cane & assistance as needed  Diet recommendation: Diabetic Diet Heart Healthy diet  Discharge Instructions   Diet - low sodium heart healthy    Complete by:  As directed      Diet Carb Modified    Complete by:  As directed      Increase activity slowly    Complete by:  As directed            Follow-up Information   Follow up with FRY,STEPHEN A, MD. Schedule an appointment as soon as possible for a visit in 1 week. (for hospital follow up and lab work)    Specialty:  Family Medicine   Contact information:   [place] [address] [phone]         Total Time spent on discharge equals 45 minutes.  SignedOren Binet 07/30/2014 10:23 AM

## 2014-07-30 NOTE — ED Provider Notes (Signed)
Medical screening examination/treatment/procedure(s) were conducted as a shared visit with non-physician practitioner(s) and myself.  I personally evaluated the patient during the encounter.   EKG Interpretation   Date/Time:  Thursday July 29 2014 17:55:44 EDT Ventricular Rate:  86 PR Interval:  117 QRS Duration: 76 QT Interval:  346 QTC Calculation: 414 R Axis:   42 Text Interpretation:  Sinus rhythm Borderline short PR interval No  significant change was found Confirmed by [facility]  MD, TREY ([id number]) on  07/29/2014 6:36:31 PM        Artis Delay, MD 07/30/14 0003

## 2014-07-30 NOTE — Progress Notes (Signed)
CSW (Clinical Education officer, museum) made aware that pt will need transportation home. CSW spoke with pt and he informed CSW he usually uses non-emergent ambulance when leaving the hospital. CSW arranged PTAR and notified pt nurse. CSW signing off.  [place], [place]

## 2014-08-02 ENCOUNTER — Encounter (HOSPITAL_COMMUNITY): Payer: Self-pay | Admitting: *Deleted

## 2014-08-02 MED ORDER — CLINDAMYCIN PHOSPHATE 600 MG/50ML IV SOLN
600.0000 mg | Freq: Once | INTRAVENOUS | Status: AC
  Administered 2014-08-03: 600 mg via INTRAVENOUS
  Filled 2014-08-02: qty 50

## 2014-08-02 NOTE — Progress Notes (Signed)
08/02/14 1237  OBSTRUCTIVE SLEEP APNEA  Have you ever been diagnosed with sleep apnea through a sleep study? No  Do you snore loudly (loud enough to be heard through closed doors)?  1  Do you often feel tired, fatigued, or sleepy during the daytime? 1  Has anyone observed you stop breathing during your sleep? 1  Do you have, or are you being treated for high blood pressure? 0  BMI more than 35 kg/m2? 0  Age over 30 years old? 0  Gender: 1  Obstructive Sleep Apnea Score 4

## 2014-08-02 NOTE — Progress Notes (Signed)
Spoke with pt and he gave permission for his mother Jason [name] to complete the SDW- pre-op assessment call. According to pt mother, pt takes 20 units of Lantus at night and wakes up with an elevated BS daily " from 220-high." As per Ebony Hail, [place] ( anesthesia), pt instructed that " it is okay to take the full 20 units of Lantus as prescribed at bedtime. However, for an AM CBG greater than 220, pt is to take half of the usual correction dose. Examples were given and pt mother verbalized understanding.

## 2014-08-03 ENCOUNTER — Ambulatory Visit (HOSPITAL_COMMUNITY)
Admission: RE | Admit: 2014-08-03 | Discharge: 2014-08-03 | Disposition: A | Discharge status: Home / Self Care | Payer: Medicaid Other | Source: Ambulatory Visit | Attending: Oral Surgery | Admitting: Oral Surgery

## 2014-08-03 ENCOUNTER — Encounter (HOSPITAL_COMMUNITY): Payer: Self-pay | Admitting: Certified Registered Nurse Anesthetist

## 2014-08-03 ENCOUNTER — Ambulatory Visit: Payer: Medicaid Other | Admitting: Physical Therapy

## 2014-08-03 ENCOUNTER — Encounter (HOSPITAL_COMMUNITY): Payer: Medicaid Other | Admitting: Certified Registered Nurse Anesthetist

## 2014-08-03 ENCOUNTER — Ambulatory Visit: Payer: Medicaid Other | Admitting: Occupational Therapy

## 2014-08-03 ENCOUNTER — Ambulatory Visit (HOSPITAL_COMMUNITY): Payer: Medicaid Other | Admitting: Certified Registered Nurse Anesthetist

## 2014-08-03 ENCOUNTER — Encounter (HOSPITAL_COMMUNITY)
Admission: RE | Disposition: A | Discharge status: Home / Self Care | Payer: Self-pay | Source: Ambulatory Visit | Attending: Oral Surgery

## 2014-08-03 DIAGNOSIS — E08621 Diabetes mellitus due to underlying condition with foot ulcer: Secondary | ICD-10-CM

## 2014-08-03 DIAGNOSIS — E1140 Type 2 diabetes mellitus with diabetic neuropathy, unspecified: Secondary | ICD-10-CM | POA: Insufficient documentation

## 2014-08-03 DIAGNOSIS — R6510 Systemic inflammatory response syndrome (SIRS) of non-infectious origin without acute organ dysfunction: Secondary | ICD-10-CM

## 2014-08-03 DIAGNOSIS — E1065 Type 1 diabetes mellitus with hyperglycemia: Secondary | ICD-10-CM

## 2014-08-03 DIAGNOSIS — J189 Pneumonia, unspecified organism: Secondary | ICD-10-CM

## 2014-08-03 DIAGNOSIS — G9340 Encephalopathy, unspecified: Secondary | ICD-10-CM

## 2014-08-03 DIAGNOSIS — Z8614 Personal history of Methicillin resistant Staphylococcus aureus infection: Secondary | ICD-10-CM | POA: Diagnosis not present

## 2014-08-03 DIAGNOSIS — K011 Impacted teeth: Secondary | ICD-10-CM | POA: Insufficient documentation

## 2014-08-03 DIAGNOSIS — Z91013 Allergy to seafood: Secondary | ICD-10-CM | POA: Insufficient documentation

## 2014-08-03 DIAGNOSIS — G8250 Quadriplegia, unspecified: Secondary | ICD-10-CM

## 2014-08-03 DIAGNOSIS — Z888 Allergy status to other drugs, medicaments and biological substances status: Secondary | ICD-10-CM | POA: Diagnosis not present

## 2014-08-03 DIAGNOSIS — Z880 Allergy status to penicillin: Secondary | ICD-10-CM | POA: Insufficient documentation

## 2014-08-03 DIAGNOSIS — R569 Unspecified convulsions: Secondary | ICD-10-CM | POA: Diagnosis not present

## 2014-08-03 DIAGNOSIS — K219 Gastro-esophageal reflux disease without esophagitis: Secondary | ICD-10-CM | POA: Insufficient documentation

## 2014-08-03 DIAGNOSIS — M797 Fibromyalgia: Secondary | ICD-10-CM | POA: Diagnosis not present

## 2014-08-03 DIAGNOSIS — IMO0001 Reserved for inherently not codable concepts without codable children: Secondary | ICD-10-CM

## 2014-08-03 DIAGNOSIS — E43 Unspecified severe protein-calorie malnutrition: Secondary | ICD-10-CM

## 2014-08-03 DIAGNOSIS — R197 Diarrhea, unspecified: Secondary | ICD-10-CM

## 2014-08-03 DIAGNOSIS — K3184 Gastroparesis: Secondary | ICD-10-CM | POA: Diagnosis not present

## 2014-08-03 DIAGNOSIS — R7401 Elevation of levels of liver transaminase levels: Secondary | ICD-10-CM

## 2014-08-03 DIAGNOSIS — N319 Neuromuscular dysfunction of bladder, unspecified: Secondary | ICD-10-CM

## 2014-08-03 DIAGNOSIS — G9511 Acute infarction of spinal cord (embolic) (nonembolic): Secondary | ICD-10-CM

## 2014-08-03 DIAGNOSIS — J9601 Acute respiratory failure with hypoxia: Secondary | ICD-10-CM

## 2014-08-03 DIAGNOSIS — E119 Type 2 diabetes mellitus without complications: Secondary | ICD-10-CM

## 2014-08-03 DIAGNOSIS — Z794 Long term (current) use of insulin: Secondary | ICD-10-CM

## 2014-08-03 DIAGNOSIS — E875 Hyperkalemia: Secondary | ICD-10-CM

## 2014-08-03 DIAGNOSIS — L97519 Non-pressure chronic ulcer of other part of right foot with unspecified severity: Secondary | ICD-10-CM

## 2014-08-03 DIAGNOSIS — E1143 Type 2 diabetes mellitus with diabetic autonomic (poly)neuropathy: Secondary | ICD-10-CM

## 2014-08-03 DIAGNOSIS — E162 Hypoglycemia, unspecified: Secondary | ICD-10-CM

## 2014-08-03 DIAGNOSIS — J45909 Unspecified asthma, uncomplicated: Secondary | ICD-10-CM | POA: Diagnosis not present

## 2014-08-03 DIAGNOSIS — L97529 Non-pressure chronic ulcer of other part of left foot with unspecified severity: Secondary | ICD-10-CM

## 2014-08-03 DIAGNOSIS — IMO0002 Reserved for concepts with insufficient information to code with codable children: Secondary | ICD-10-CM

## 2014-08-03 DIAGNOSIS — K029 Dental caries, unspecified: Secondary | ICD-10-CM | POA: Diagnosis not present

## 2014-08-03 DIAGNOSIS — Z8673 Personal history of transient ischemic attack (TIA), and cerebral infarction without residual deficits: Secondary | ICD-10-CM | POA: Diagnosis not present

## 2014-08-03 DIAGNOSIS — R740 Nonspecific elevation of levels of transaminase and lactic acid dehydrogenase [LDH]: Secondary | ICD-10-CM

## 2014-08-03 DIAGNOSIS — E1040 Type 1 diabetes mellitus with diabetic neuropathy, unspecified: Secondary | ICD-10-CM

## 2014-08-03 HISTORY — DX: Fibromyalgia: M79.7

## 2014-08-03 HISTORY — DX: Family history of other specified conditions: Z84.89

## 2014-08-03 HISTORY — DX: Unspecified osteoarthritis, unspecified site: M19.90

## 2014-08-03 HISTORY — DX: Pneumonia, unspecified organism: J18.9

## 2014-08-03 HISTORY — PX: MULTIPLE EXTRACTIONS WITH ALVEOLOPLASTY: SHX5342

## 2014-08-03 HISTORY — DX: Cardiac arrhythmia, unspecified: I49.9

## 2014-08-03 LAB — COMPREHENSIVE METABOLIC PANEL
ALT: 52 U/L (ref 0–53)
AST: 28 U/L (ref 0–37)
Albumin: 3.4 g/dL — ABNORMAL LOW (ref 3.5–5.2)
Alkaline Phosphatase: 174 U/L — ABNORMAL HIGH (ref 39–117)
Anion gap: 12 (ref 5–15)
BUN: 19 mg/dL (ref 6–23)
CALCIUM: 10.4 mg/dL (ref 8.4–10.5)
CO2: 27 mEq/L (ref 19–32)
Chloride: 102 mEq/L (ref 96–112)
Creatinine, Ser: 0.76 mg/dL (ref 0.50–1.35)
GFR calc Af Amer: >90 mL/min (ref >90)
GFR calc non Af Amer: >90 mL/min (ref >90)
Glucose, Bld: 149 mg/dL — ABNORMAL HIGH (ref 70–99)
Potassium: 4.2 mEq/L (ref 3.7–5.3)
SODIUM: 141 meq/L (ref 137–147)
TOTAL PROTEIN: 8.2 g/dL (ref 6.0–8.3)
Total Bilirubin: 0.2 mg/dL — ABNORMAL LOW (ref 0.3–1.2)

## 2014-08-03 LAB — GLUCOSE, CAPILLARY
GLUCOSE-CAPILLARY: 150 mg/dL — AB (ref 70–99)
Glucose-Capillary: 164 mg/dL — ABNORMAL HIGH (ref 70–99)
Glucose-Capillary: 216 mg/dL — ABNORMAL HIGH (ref 70–99)

## 2014-08-03 SURGERY — MULTIPLE EXTRACTION WITH ALVEOLOPLASTY
Anesthesia: General | Site: Mouth

## 2014-08-03 MED ORDER — LIDOCAINE-EPINEPHRINE 2 %-1:100000 IJ SOLN
INTRAMUSCULAR | Status: DC | PRN
  Administered 2014-08-03: 20 mL

## 2014-08-03 MED ORDER — HYDROMORPHONE HCL 1 MG/ML IJ SOLN
INTRAMUSCULAR | Status: AC
  Filled 2014-08-03: qty 1

## 2014-08-03 MED ORDER — FENTANYL CITRATE 0.05 MG/ML IJ SOLN
INTRAMUSCULAR | Status: DC | PRN
  Administered 2014-08-03: 50 ug via INTRAVENOUS
  Administered 2014-08-03: 100 ug via INTRAVENOUS

## 2014-08-03 MED ORDER — FENTANYL CITRATE 0.05 MG/ML IJ SOLN
INTRAMUSCULAR | Status: AC
  Filled 2014-08-03: qty 5

## 2014-08-03 MED ORDER — SUCCINYLCHOLINE CHLORIDE 20 MG/ML IJ SOLN
INTRAMUSCULAR | Status: DC | PRN
  Administered 2014-08-03: 100 mg via INTRAVENOUS

## 2014-08-03 MED ORDER — ONDANSETRON HCL 4 MG/2ML IJ SOLN
INTRAMUSCULAR | Status: DC | PRN
  Administered 2014-08-03: 4 mg via INTRAVENOUS

## 2014-08-03 MED ORDER — 0.9 % SODIUM CHLORIDE (POUR BTL) OPTIME
TOPICAL | Status: DC | PRN
  Administered 2014-08-03: 1000 mL

## 2014-08-03 MED ORDER — OXYMETAZOLINE HCL 0.05 % NA SOLN
NASAL | Status: AC
  Filled 2014-08-03: qty 15

## 2014-08-03 MED ORDER — SODIUM CHLORIDE 0.9 % IR SOLN
Status: DC | PRN
  Administered 2014-08-03: 1000 mL

## 2014-08-03 MED ORDER — OXYCODONE HCL 5 MG PO TABS: 5.0000 mg | ORAL_TABLET | Freq: Once | ORAL | Status: DC | PRN

## 2014-08-03 MED ORDER — FENTANYL CITRATE 0.05 MG/ML IJ SOLN: 25.0000 ug | INTRAMUSCULAR | Status: DC | PRN

## 2014-08-03 MED ORDER — PROMETHAZINE HCL 25 MG/ML IJ SOLN: 6.2500 mg | INTRAMUSCULAR | Status: DC | PRN

## 2014-08-03 MED ORDER — LIDOCAINE HCL (CARDIAC) 20 MG/ML IV SOLN
INTRAVENOUS | Status: AC
  Filled 2014-08-03: qty 5

## 2014-08-03 MED ORDER — OXYMETAZOLINE HCL 0.05 % NA SOLN
NASAL | Status: DC | PRN
  Administered 2014-08-03: 1 via NASAL

## 2014-08-03 MED ORDER — METHOCARBAMOL 500 MG PO TABS
ORAL_TABLET | ORAL | Status: AC
  Filled 2014-08-03: qty 1

## 2014-08-03 MED ORDER — LIDOCAINE-EPINEPHRINE 2 %-1:100000 IJ SOLN
INTRAMUSCULAR | Status: AC
  Filled 2014-08-03: qty 1

## 2014-08-03 MED ORDER — PROPOFOL 10 MG/ML IV BOLUS
INTRAVENOUS | Status: DC | PRN
  Administered 2014-08-03: 100 mg via INTRAVENOUS

## 2014-08-03 MED ORDER — OXYCODONE HCL 5 MG/5ML PO SOLN
5.0000 mg | Freq: Once | ORAL | Status: DC | PRN
Start: 2014-08-03 — End: 2014-08-03

## 2014-08-03 MED ORDER — ONDANSETRON HCL 4 MG/2ML IJ SOLN
INTRAMUSCULAR | Status: AC
  Filled 2014-08-03: qty 2

## 2014-08-03 MED ORDER — PROPOFOL 10 MG/ML IV BOLUS
INTRAVENOUS | Status: AC
  Filled 2014-08-03: qty 20

## 2014-08-03 MED ORDER — LIDOCAINE HCL (CARDIAC) 20 MG/ML IV SOLN
INTRAVENOUS | Status: DC | PRN
  Administered 2014-08-03: 40 mg via INTRAVENOUS

## 2014-08-03 MED ORDER — LACTATED RINGERS IV SOLN
INTRAVENOUS | Status: DC | PRN
  Administered 2014-08-03: 09:00:00 via INTRAVENOUS

## 2014-08-03 MED ORDER — MIDAZOLAM HCL 2 MG/2ML IJ SOLN
INTRAMUSCULAR | Status: AC
  Filled 2014-08-03: qty 2

## 2014-08-03 MED ORDER — LACTATED RINGERS IV SOLN: INTRAVENOUS | Status: DC

## 2014-08-03 MED ORDER — CLINDAMYCIN HCL 300 MG PO CAPS: 300.0000 mg | ORAL_CAPSULE | Freq: Three times a day (TID) | ORAL | Status: DC

## 2014-08-03 SURGICAL SUPPLY — 32 items
BLADE SURG 15 STRL LF DISP TIS (BLADE) ×1 IMPLANT
BLADE SURG 15 STRL SS (BLADE) ×2
BUR CROSS CUT FISSURE 1.6 (BURR) ×2 IMPLANT
BUR CROSS CUT FISSURE 1.6MM (BURR) ×1
BUR EGG ELITE 4.0 (BURR) ×2 IMPLANT
BUR EGG ELITE 4.0MM (BURR) ×1
CANISTER SUCTION 2500CC (MISCELLANEOUS) ×3 IMPLANT
COVER SURGICAL LIGHT HANDLE (MISCELLANEOUS) ×3 IMPLANT
CRADLE DONUT ADULT HEAD (MISCELLANEOUS) ×3 IMPLANT
GAUZE PACKING FOLDED 2  STR (GAUZE/BANDAGES/DRESSINGS) ×2
GAUZE PACKING FOLDED 2 STR (GAUZE/BANDAGES/DRESSINGS) ×1 IMPLANT
GLOVE BIO SURGEON STRL SZ 6.5 (GLOVE) ×4 IMPLANT
GLOVE BIO SURGEON STRL SZ7.5 (GLOVE) ×3 IMPLANT
GLOVE BIO SURGEONS STRL SZ 6.5 (GLOVE) ×2
GLOVE BIOGEL PI IND STRL 7.0 (GLOVE) ×2 IMPLANT
GLOVE BIOGEL PI INDICATOR 7.0 (GLOVE) ×4
GOWN STRL REUS W/ TWL LRG LVL3 (GOWN DISPOSABLE) ×1 IMPLANT
GOWN STRL REUS W/ TWL XL LVL3 (GOWN DISPOSABLE) ×1 IMPLANT
GOWN STRL REUS W/TWL LRG LVL3 (GOWN DISPOSABLE) ×2
GOWN STRL REUS W/TWL XL LVL3 (GOWN DISPOSABLE) ×2
KIT BASIN OR (CUSTOM PROCEDURE TRAY) ×3 IMPLANT
KIT ROOM TURNOVER OR (KITS) ×3 IMPLANT
NEEDLE 22X1 1/2 (OR ONLY) (NEEDLE) ×3 IMPLANT
NS IRRIG 1000ML POUR BTL (IV SOLUTION) ×3 IMPLANT
PAD ARMBOARD 7.5X6 YLW CONV (MISCELLANEOUS) ×6 IMPLANT
SCRUB BETADINE 4OZ XXX (MISCELLANEOUS) IMPLANT
SUT CHROMIC 3 0 PS 2 (SUTURE) ×6 IMPLANT
SYRINGE 10CC LL (SYRINGE) ×3 IMPLANT
TOWEL OR 17X26 10 PK STRL BLUE (TOWEL DISPOSABLE) ×3 IMPLANT
TRAY ENT MC OR (CUSTOM PROCEDURE TRAY) ×3 IMPLANT
TUBING IRRIGATION (MISCELLANEOUS) ×3 IMPLANT
YANKAUER SUCT BULB TIP NO VENT (SUCTIONS) ×3 IMPLANT

## 2014-08-03 NOTE — Anesthesia Procedure Notes (Signed)
Procedure Name: Intubation Date/Time: 08/03/2014 9:17 AM Performed by: Ollen Bowl Pre-anesthesia Checklist: Patient identified, Emergency Drugs available, Suction available, Patient being monitored and Timeout performed Patient Re-evaluated:Patient Re-evaluated prior to inductionOxygen Delivery Method: Circle system utilized Preoxygenation: Pre-oxygenation with 100% oxygen Intubation Type: IV induction Ventilation: Mask ventilation without difficulty Laryngoscope Size: Mac and 4 Grade View: Grade I Nasal Tubes: Right Tube size: 6.5 mm Number of attempts: 1 Placement Confirmation: ETT inserted through vocal cords under direct vision,  positive ETCO2 and breath sounds checked- equal and bilateral Tube secured with: Tape Dental Injury: Teeth and Oropharynx as per pre-operative assessment

## 2014-08-03 NOTE — Transfer of Care (Signed)
Immediate Anesthesia Transfer of Care Note  Patient: Jason [name]  Procedure(s) Performed: Procedure(s): MULTIPLE EXTRACTIONS (N/A)  Patient Location: PACU  Anesthesia Type:General  Level of Consciousness: awake and alert   Airway & Oxygen Therapy: Patient Spontanous Breathing and Patient connected to face mask oxygen  Post-op Assessment: Report given to PACU RN and Post -op Vital signs reviewed and stable  Post vital signs: Reviewed and stable  Complications: No apparent anesthesia complications

## 2014-08-03 NOTE — Anesthesia Preprocedure Evaluation (Addendum)
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 CVA negative psych ROS   GI/Hepatic Neg liver ROS, GERD-  ,  Endo/Other  diabetes, Type 2  Renal/GU negative Renal ROS     Musculoskeletal  (+) Arthritis -, Fibromyalgia -  Abdominal   Peds  Hematology  (+) anemia ,   Anesthesia Other Findings   Reproductive/Obstetrics                            Anesthesia Physical Anesthesia Plan  ASA: III  Anesthesia Plan: General   Post-op Pain Management:    Induction: Intravenous  Airway Management Planned: Nasal ETT  Additional Equipment:   Intra-op Plan:   Post-operative Plan: Extubation in OR  Informed Consent:   Dental advisory given  Plan Discussed with: CRNA and Anesthesiologist  Anesthesia Plan Comments:         Anesthesia Quick Evaluation

## 2014-08-03 NOTE — Anesthesia Postprocedure Evaluation (Signed)
  Anesthesia Post-op Note  Patient: Jason [name]  Procedure(s) Performed: Procedure(s): MULTIPLE EXTRACTIONS (N/A)  Patient Location: PACU  Anesthesia Type:General  Level of Consciousness: awake, alert  and oriented  Airway and Oxygen Therapy: Patient Spontanous Breathing  Post-op Pain: none  Post-op Assessment: Post-op Vital signs reviewed  Post-op Vital Signs: Reviewed  Last Vitals:  Filed Vitals:   08/03/14 1200  BP: 103/77  Pulse: 83  Temp:   Resp:     Complications: No apparent anesthesia complications

## 2014-08-03 NOTE — Discharge Instructions (Signed)
General Anesthesia, Adult, Care After  Refer to this sheet in the next few weeks. These instructions provide you with information on caring for yourself after your procedure. Your health care provider may also give you more specific instructions. Your treatment has been planned according to current medical practices, but problems sometimes occur. Call your health care provider if you have any problems or questions after your procedure.  WHAT TO EXPECT AFTER THE PROCEDURE  After the procedure, it is typical to experience:  Sleepiness.  Nausea and vomiting. HOME CARE INSTRUCTIONS  For the first 24 hours after general anesthesia:  Have a responsible person with you.  Do not drive a car. If you are alone, do not take public transportation.  Do not drink alcohol.  Do not take medicine that has not been prescribed by your health care provider.  Do not sign important papers or make important decisions.  You may resume a normal diet and activities as directed by your health care provider.  Change bandages (dressings) as directed.  If you have questions or problems that seem related to general anesthesia, call the hospital and ask for the anesthetist or anesthesiologist on call. SEEK MEDICAL CARE IF:  You have nausea and vomiting that continue the day after anesthesia.  You develop a rash. SEEK IMMEDIATE MEDICAL CARE IF:  You have difficulty breathing.  You have chest pain.  You have any allergic problems. Document Released: 12/31/2000 Document Revised: 05/27/2013 Document Reviewed: 04/09/2013  [facility] Patient Information 2014 [place], [place].   What to Eat after Tooth extraction:     For your first meals, you should eat lightly; only small meals at first.   Avoid Sharp, Crunchy, and Hot foods.   If you do not have nausea, you may eat larger meals.  Avoid spicy, greasy and heavy food, as these may make you sick after the anesthesia.  Sinus precautions after surgery:  1. Avoid blowing your  nose.  2. Avoid sneezing. If you might sneeze, Keep her mouth open and do not pinch your nose closed.  3. Avoid sucking on straws. Avoid smoking.  4. Do not lift objects weighing more than 20 pounds  Call if questions arise.   MOUTH CARE AFTER SURGERY  FACTS:  Ice used in ice bag helps keep the swelling down, and can help lessen the pain.  It is easier to treat pain BEFORE it happens.  Spitting disturbs the clot and may cause bleeding to start again, or to get worse.  Smoking delays healing and can cause complications.  Sharing prescriptions can be dangerous. Do not take medications not recently prescribed for you.  Antibiotics may stop birth control pills from working. Use other means of birth control while on antibiotics.  Warm salt water rinses after the first 24 hours will help lessen the swelling: Use 1/2 teaspoonful of table salt per oz.of water. DO NOT:  Do not spit. Do not drink through a straw.  Strongly advised not to smoke, dip snuff or chew tobacco at least for 3 days.  Do not eat sharp or crunchy foods. Avoid the area of surgery when chewing.  Do not stop your antibiotics before your instructions say to do so.  Do not eat hot foods until bleeding has stopped. If you need to, let your food cool down to room temperature. EXPECT:  Some swelling, especially first 2-3 days.  Soreness or discomfort in varying degrees. Follow your dentist's instructions about how to handle pain before it starts.  Pinkish saliva  or light blood in saliva, or on your pillow in the morning. This can last around 24 hours.  Bruising inside or outside the mouth. This may not show up until 2-3 days after surgery. Don't worry, it will go away in time.  Pieces of "bone" may work themselves loose. It's OK. If they bother you, let us know. WHAT TO DO IMMEDIATELY AFTER SURGERY:  Bite on the gauze with steady pressure for 1-2 hours. Don't chew on the gauze.  Do not lie down flat. Raise your head support  especially for the first 24 hours.  Apply ice to your face on the side of the surgery. You may apply it 20 minutes on and a few minutes off. Ice for 8-12 hours. You may use ice up to 24 hours.  Before the numbness wears off, take a pain pill as instructed.  Prescription pain medication is not always required. SWELLING:  Expect swelling for the first couple of days. It should get better after that.  If swelling increases 3 days or so after surgery; let us know as soon as possible. FEVER:  Take Tylenol every 4 hours if needed to lower your temperature, especially if it is at 100F or higher.  Drink lots of fluids.  If the fever does not go away, let us know. BREATHING TROUBLE:  Any unusual difficulty breathing means you have to have someone bring you to the emergency room ASAP BLEEDING:  Light oozing is expected for 24 hours or so.  Prop head up with pillows  Avoid spitting  Do not confuse bright red fresh flowing blood with lots of saliva colored with a little bit of blood.  If you notice some bleeding, place gauze or a tea bag where it is bleeding and apply CONSTANT pressure by biting down for 1 hour. Avoid talking during this time. Do not remove the gauze or tea bag during this hour to "check" the bleeding.  If you notice bright RED bleeding FLOWING out of particular area, and filling the floor of your mouth, put a wad of gauze on that area, bite down firmly and constantly. Call us immediately. If we're closed, have someone bring you to the emergency room. ORAL HYGIENE:  Brush your teeth as usual after meals and before bedtime.  Use a soft toothbrush around the area of surgery.  DO NOT AVOID BRUSHING. Otherwise bacteria(germs) will grow and may delay healing or encourage infection.  Since you cannot spit, just gently rinse and let the water flow out of your mouth.  DO NOT SWISH HARD. EATING:  Cool liquids are a good point to start. Increase to soft foods as tolerated. PRESCRIPTIONS:    Follow the directions for your prescriptions exactly as written.  If your doctor gave you a narcotic pain medication, do not drive, operate machinery or drink alcohol when on that medication. QUESTIONS:  Call our office during office hours

## 2014-08-03 NOTE — Op Note (Signed)
NAMEMONTE, ZINNI             ACCOUNT NO.:  [id number]  MEDICAL RECORD NO.:  [id number]  LOCATION:  [facility]                         FACILITY:  [facility]  PHYSICIAN:  Gae Bon, M.D.  DATE OF BIRTH:  Jun 19, 1986  DATE OF PROCEDURE:  08/03/2014 DATE OF DISCHARGE:                              OPERATIVE REPORT   PREOPERATIVE DIAGNOSIS:  Nonrestorable teeth #2, 5, 13, 15; impacted teeth #1, 16, 17, 32.  POSTOPERATIVE DIAGNOSIS:  Nonrestorable teeth #2, 5, 13, 15; impacted teeth #1, 16, 17, 32.  PROCEDURE:  Extraction of teeth #1, 2, 5, 13, 15, 17, 32.  SURGEON:  Gae Bon, M.D.  ANESTHESIA:  General, nasal intubation.  DESCRIPTION OF PROCEDURE:  The patient was taken to the operating room, placed on the table in supine position.  General anesthesia was administered intravenously and nasal endotracheal tube was placed and secured.  The eyes were protected, and the patient was draped for the procedure.  Time-out was performed.  The posterior pharynx was suctioned and the throat pack was placed.  A 2% lidocaine with 1:100,000 epinephrine was infiltrated in an inferior alveolar block on the right and left side, and buccal and palatal infiltration in the maxilla around the teeth to be removed.  A bite block was placed in the right side of the mouth and a sweetheart retractor was used to retract the tongue.  A #15 blade was used to make an incision overlying tooth #17 and the surrounding teeth #16, 15, and 13.  The periosteum was reflected from around these teeth with a periosteal elevator.  Then, bone was removed in the left mandible.  Around tooth #17, the tooth was sectioned and removed in multiple pieces.  In the maxilla, bone was removed around tooth #16 and then the tooth was elevated and removed with a 301 elevator and upper universal forceps.  Tooth #15 required sectioning and each root was removed with the rongeur.  Tooth #13 required bone removal with a Stryker  handpiece and the tooth was eventually removed with a 301 elevator and rongeur.  Then, the left side was curetted with a #9 Molt curette and sockets were irrigated and closed with 3-0 chromic.  The bite block and sweetheart retractor were repositioned to the other side of the mouth, and the 15 blade was used to make an incision around teeth #1, 2, and 5 in the maxilla and overlying tooth #32 in the mandible. The periosteum was reflected from around these teeth using the periosteal elevator.  Bone was removed using the Stryker handpiece around tooth #32.  The tooth was sectioned and removed in multiple pieces with the 301 elevator.  Bone was removed around tooth #1 with the handpiece and then this tooth was elevated and removed with the 301 elevator and upper universal forceps.  Tooth #2 required sectioning and tooth #5 required bone removal with the Stryker handpiece.  These teeth were removed with the 301 elevator and rongeurs.  Then, the areas were curetted, irrigated, and closed with 3-0 chromic.  The oral cavity was inspected, found to have good contour and closure and hemostasis.  The oral cavity was irrigated and suctioned.  The throat pack was removed.  The patient was awakened, taken to the recovery room, breathing spontaneously in good condition.  EBL:  Minimal.  COMPLICATIONS:  None.  SPECIMENS:  None.     Gae Bon, M.D.     SMJ/MEDQ  D:  08/03/2014  T:  08/03/2014  Job:  [id number]

## 2014-08-03 NOTE — H&P (Signed)
HISTORY AND PHYSICAL  Jason [name] is a 28 y.o. male patient referred by general dentist for multiple dental extractions.  No diagnosis found.  Past Medical History  Diagnosis Date  . GERD (gastroesophageal reflux disease)   . Asthma   . Hx MRSA infection     on face  . Gastroparesis   . Diabetic neuropathy   . Seizures   . Stroke     spinal stroke in 4/15  . Diabetes mellitus     sees Dr. Loanne Drilling   . Family history of anesthesia complication     Pt mother can't have epidural procedures  . Dysrhythmia   . Pneumonia   . Arthritis   . Fibromyalgia     Current Facility-Administered Medications  Medication Dose Route Frequency Provider Last Rate Last Dose  . 0.9 % irrigation (POUR BTL)    PRN Gae Bon, DDS   1,000 mL at 08/03/14 0846  . clindamycin (CLEOCIN) IVPB 600 mg  600 mg Intravenous Once Gae Bon, DDS      . lactated ringers infusion   Intravenous Continuous [name], MD      . lidocaine-EPINEPHrine (XYLOCAINE W/EPI) 2 %-1:100000 (with pres) injection    PRN Gae Bon, DDS   20 mL at 08/03/14 0846  . oxymetazoline (AFRIN) 0.05 % nasal spray    PRN Gae Bon, DDS   1 application at 63/14/97 [phone]  . sodium chloride irrigation 0.9 %    PRN Gae Bon, DDS   1,000 mL at 08/03/14 0846   Allergies  Allergen Reactions  . Cefuroxime Axetil Anaphylaxis  . Penicillins Anaphylaxis and Other (See Comments)    ?can take amoxicillin?  [name] [Benzonatate] Anaphylaxis  . Shellfish Allergy Itching and Other (See Comments)    Took benadryl to alleviate reaction   Active Problems:   * No active hospital problems. *  Vitals: There were no vitals taken for this visit. Lab results:No results found for this or any previous visit (from the past 67 hour(s)). Radiology Results: No results found. General appearance: alert, cooperative and no distress Head: Normocephalic, without obvious abnormality, atraumatic Eyes: negative Nose: Nares  normal. Septum midline. Mucosa normal. No drainage or sinus tenderness. Throat: Dental caries teeth  2, 5, 13, 15,  Impacted teeth # 1, 16, 17, 32, no trismus or lesions Neck: no adenopathy and supple, symmetrical, trachea midline Resp: clear to auscultation bilaterally Cardio: regular rate and rhythm, S1, S2 normal, no murmur, click, rub or gallop  Assessment: Non-restorable/impacted teeth # 1, 2, 5, 13, 15, 16, 17, 32  Plan: extract teeth # 1, 2, 5, 13, 15, 26, 17, 32. General anesthesia. Day surgery or 24h obs.   Jason [name] M 08/03/2014

## 2014-08-03 NOTE — Op Note (Signed)
08/03/2014  9:52 AM  PATIENT:  Jason [name]  28 y.o. male  PRE-OPERATIVE DIAGNOSIS:  NON RESTORABLE TEETH #'s 2, 5, 13, 15, impacted teeth 1, 16, 17, 32  POST-OPERATIVE DIAGNOSIS:  SAME  PROCEDURE:  Procedure(s): MULTIPLE EXTRACTIONS teeth 1, 2, 5, 13, 15, 17, 32  SURGEON:  Surgeon(s): Gae Bon, DDS  ANESTHESIA:   local and general  EBL:  minimal  DRAINS: none   SPECIMEN:  No Specimen  COUNTS:  YES  PLAN OF CARE: Discharge to home after PACU  PATIENT DISPOSITION:  PACU - hemodynamically stable.   PROCEDURE DETAILS: Dictation # [id number]  Gae Bon, DMD 08/03/2014 9:52 AM

## 2014-08-04 ENCOUNTER — Encounter (HOSPITAL_COMMUNITY): Payer: Self-pay | Admitting: Oral Surgery

## 2014-08-05 ENCOUNTER — Telehealth: Payer: Self-pay | Admitting: Family Medicine

## 2014-08-05 ENCOUNTER — Encounter: Payer: Medicaid Other | Admitting: Occupational Therapy

## 2014-08-05 ENCOUNTER — Ambulatory Visit: Payer: Medicaid Other | Admitting: Physical Therapy

## 2014-08-05 NOTE — Telephone Encounter (Signed)
Patient Information:  Caller Name: Treasa School  Phone: [phone]  Patient: Jason [name], Jason [name]  Gender: Male  DOB: 07-14-1986  Age: 28 Years  PCP: [name] [facility])  Office Follow Up:  Does the office need to follow up with this patient?: Yes  Instructions For The Office: Please review with MD and call back ASAP to advise MD recommendations for tetraplegic pt unable to come to office.  RN Note:  Had 7 teeth extracted at [facility] 08/03/14.  Drank no fluids 08/03/14 after surgery because he slept all day after oral surgery and had packing in his mouth.  No palpable bladder distension.  Pt unable to feel pain in bladder.  Had no urine 08/04/14.  Instructed to push catheter up slightly and rotate tubing slightly to see if urine returns.   When [name] looked at catheter, she found 200 cc  darker yellow urine in bag and clear yellow urine in tubing.  Estimated he had about 64 oz of fluid in last 24 hours.  FBS 213 at 0430.  Instructed to offer water frequently today and measure how much fluid he drinks and urine output.  Advised to see MD now per orders for ED disposition when office is open for high risk (hx of tetraplegia & DM) per Urination- All Other Symptoms.  [place] office appointment 08/05/14 because transportation requires 3 days notice or an ambulance would be required for ED transport. Informed RN will review disposition with MD and call back made with  MD recommendation.  Symptoms  Reason For Call & Symptoms: Mimimal urine output (per catheter) since 0430 08/03/14 (> 52 hours).   Saw small amount of urine only in part of tubing  before recatherization 08/04/14 at 0430.  Reviewed Health History In EMR: Yes  Reviewed Medications In EMR: Yes  Reviewed Allergies In EMR: Yes  Reviewed Surgeries / Procedures: Yes  Date of Onset of Symptoms: 08/03/2014  Treatments Tried: offered fluids, re-catherized pt twice  Treatments Tried Worked: No  Guideline(s) Used:  No Protocol Available  - Sick Adult  Disposition Per Guideline:   Go to ED Now  Reason For Disposition Reached:   Nursing judgment  Advice Given:  N/A  Patient Refused Recommendation:  Patient Will Follow Up With Office Later  Unable to get transportation to office on same day.

## 2014-08-05 NOTE — Telephone Encounter (Signed)
I spoke with [name] pt's mom, she just check catheter bag and pt had put out 500 cc of urine since this triage call. I gave this information to Dr. Sarajane Jews and he said for mom to continue giving pt fluids and monitor the output, no need to ER now since pt is drinking and now has a urine output.

## 2014-08-05 NOTE — Telephone Encounter (Signed)
Patient Information:  Caller Name: Treasa School  Phone: [phone]  Patient: Jason [name], Jason [name]  Gender: Male  DOB: 09/03/1986  Age: 28 Years  PCP: [name] [facility])  Office Follow Up:  Does the office need to follow up with this patient?: Yes  Instructions For The Office: Please review with MD and call back ASAP to advise MD recommendations for tetraplegic pt unable to come to office.  RN Note:  Had 7 teeth extracted at [facility] 08/03/14.  Drank no fluids 08/03/14 after surgery because he slept all day after oral surgery and had packing in his mouth.  No palpable bladder distension.  Pt unable to feel pain in bladder.  Had no urine 08/03/14 and scant urine 08/04/14.  Instructed to push catheter up slightly and rotate tubing slightly to see if urine returns.  When [name] checked catheter during call , she found 200 cc  darker yellow urine in bag and clear yellow urine in tubing.  Estimated he had about 64 oz of fluid in last 24 hours.  FBS 213 at 0430.  Instructed to offer water frequently today and measure how much fluid he drinks and urine output.  Advised to see MD now per orders for ED disposition when office is open for high risk (hx of tetraplegia & DM) per Urination- All Other Symptoms.  [place] office appointment 08/05/14 because transportation requires 3 days notice or an ambulance would be required for ED transport. Informed RN will review disposition with MD and call back made with  MD recommendation.  Symptoms  Reason For Call & Symptoms: Mimimal urine output (per catheter) since 0430 08/03/14 (> 52 hours).   Saw small amount of urine only in part of tubing  before recatherization 08/04/14 at 0430.  Reviewed Health History In EMR: Yes  Reviewed Medications In EMR: Yes  Reviewed Allergies In EMR: Yes  Reviewed Surgeries / Procedures: Yes  Date of Onset of Symptoms: 08/03/2014  Treatments Tried: offered fluids, re-catherized pt twice  Treatments Tried Worked:  No  Guideline(s) Used:  No Protocol Available - Sick Adult  Disposition Per Guideline:   Go to ED Now  Reason For Disposition Reached:   Nursing judgment  Advice Given:  N/A  Patient Refused Recommendation:  Patient Will Follow Up With Office Later  Unable to get transportation to office on same day.

## 2014-08-09 ENCOUNTER — Ambulatory Visit: Payer: Medicaid Other | Admitting: Family Medicine

## 2014-08-10 ENCOUNTER — Ambulatory Visit: Payer: Medicaid Other

## 2014-08-10 ENCOUNTER — Telehealth: Payer: Self-pay

## 2014-08-11 ENCOUNTER — Telehealth: Payer: Self-pay | Admitting: Family Medicine

## 2014-08-11 DIAGNOSIS — G9511 Acute infarction of spinal cord (embolic) (nonembolic): Secondary | ICD-10-CM

## 2014-08-11 NOTE — Telephone Encounter (Signed)
Mom called and said that  [facility] need orders to resume Occupational  and Physical therapy

## 2014-08-11 NOTE — Telephone Encounter (Signed)
I put in order for referral in Epic.

## 2014-08-11 NOTE — Telephone Encounter (Signed)
Okay, per Dr. Sarajane Jews.

## 2014-08-12 ENCOUNTER — Inpatient Hospital Stay (HOSPITAL_BASED_OUTPATIENT_CLINIC_OR_DEPARTMENT_OTHER)
Admission: EM | Admit: 2014-08-12 | Discharge: 2014-08-26 | DRG: 871 | Disposition: A | Discharge status: Home / Self Care | Payer: Medicaid Other | Attending: Internal Medicine | Admitting: Internal Medicine

## 2014-08-12 ENCOUNTER — Ambulatory Visit: Payer: Medicaid Other

## 2014-08-12 ENCOUNTER — Emergency Department (HOSPITAL_BASED_OUTPATIENT_CLINIC_OR_DEPARTMENT_OTHER): Payer: Medicaid Other

## 2014-08-12 ENCOUNTER — Encounter (HOSPITAL_BASED_OUTPATIENT_CLINIC_OR_DEPARTMENT_OTHER): Payer: Self-pay

## 2014-08-12 ENCOUNTER — Telehealth: Payer: Self-pay | Admitting: Family Medicine

## 2014-08-12 DIAGNOSIS — D638 Anemia in other chronic diseases classified elsewhere: Secondary | ICD-10-CM | POA: Diagnosis present

## 2014-08-12 DIAGNOSIS — L8994 Pressure ulcer of unspecified site, stage 4: Secondary | ICD-10-CM | POA: Insufficient documentation

## 2014-08-12 DIAGNOSIS — K3184 Gastroparesis: Secondary | ICD-10-CM | POA: Diagnosis present

## 2014-08-12 DIAGNOSIS — R532 Functional quadriplegia: Secondary | ICD-10-CM | POA: Diagnosis present

## 2014-08-12 DIAGNOSIS — L89152 Pressure ulcer of sacral region, stage 2: Secondary | ICD-10-CM | POA: Diagnosis present

## 2014-08-12 DIAGNOSIS — Z91013 Allergy to seafood: Secondary | ICD-10-CM

## 2014-08-12 DIAGNOSIS — A419 Sepsis, unspecified organism: Secondary | ICD-10-CM | POA: Diagnosis present

## 2014-08-12 DIAGNOSIS — N189 Chronic kidney disease, unspecified: Secondary | ICD-10-CM | POA: Diagnosis present

## 2014-08-12 DIAGNOSIS — Z794 Long term (current) use of insulin: Secondary | ICD-10-CM | POA: Diagnosis not present

## 2014-08-12 DIAGNOSIS — M869 Osteomyelitis, unspecified: Secondary | ICD-10-CM | POA: Diagnosis present

## 2014-08-12 DIAGNOSIS — T68XXXA Hypothermia, initial encounter: Secondary | ICD-10-CM | POA: Diagnosis present

## 2014-08-12 DIAGNOSIS — Z880 Allergy status to penicillin: Secondary | ICD-10-CM | POA: Diagnosis not present

## 2014-08-12 DIAGNOSIS — E10649 Type 1 diabetes mellitus with hypoglycemia without coma: Secondary | ICD-10-CM | POA: Diagnosis present

## 2014-08-12 DIAGNOSIS — J189 Pneumonia, unspecified organism: Secondary | ICD-10-CM

## 2014-08-12 DIAGNOSIS — R7881 Bacteremia: Secondary | ICD-10-CM

## 2014-08-12 DIAGNOSIS — Z888 Allergy status to other drugs, medicaments and biological substances status: Secondary | ICD-10-CM | POA: Diagnosis not present

## 2014-08-12 DIAGNOSIS — J9602 Acute respiratory failure with hypercapnia: Secondary | ICD-10-CM | POA: Diagnosis present

## 2014-08-12 DIAGNOSIS — T424X5A Adverse effect of benzodiazepines, initial encounter: Secondary | ICD-10-CM | POA: Diagnosis not present

## 2014-08-12 DIAGNOSIS — G9340 Encephalopathy, unspecified: Secondary | ICD-10-CM

## 2014-08-12 DIAGNOSIS — E872 Acidosis: Secondary | ICD-10-CM | POA: Diagnosis present

## 2014-08-12 DIAGNOSIS — Z833 Family history of diabetes mellitus: Secondary | ICD-10-CM | POA: Diagnosis not present

## 2014-08-12 DIAGNOSIS — Y95 Nosocomial condition: Secondary | ICD-10-CM | POA: Diagnosis present

## 2014-08-12 DIAGNOSIS — J9811 Atelectasis: Secondary | ICD-10-CM | POA: Diagnosis present

## 2014-08-12 DIAGNOSIS — Z8249 Family history of ischemic heart disease and other diseases of the circulatory system: Secondary | ICD-10-CM

## 2014-08-12 DIAGNOSIS — J45909 Unspecified asthma, uncomplicated: Secondary | ICD-10-CM | POA: Diagnosis present

## 2014-08-12 DIAGNOSIS — Z01818 Encounter for other preprocedural examination: Secondary | ICD-10-CM

## 2014-08-12 DIAGNOSIS — G8250 Quadriplegia, unspecified: Secondary | ICD-10-CM | POA: Diagnosis present

## 2014-08-12 DIAGNOSIS — IMO0002 Reserved for concepts with insufficient information to code with codable children: Secondary | ICD-10-CM | POA: Diagnosis present

## 2014-08-12 DIAGNOSIS — G9511 Acute infarction of spinal cord (embolic) (nonembolic): Secondary | ICD-10-CM | POA: Diagnosis present

## 2014-08-12 DIAGNOSIS — J9601 Acute respiratory failure with hypoxia: Secondary | ICD-10-CM | POA: Diagnosis present

## 2014-08-12 DIAGNOSIS — B9561 Methicillin susceptible Staphylococcus aureus infection as the cause of diseases classified elsewhere: Secondary | ICD-10-CM | POA: Diagnosis present

## 2014-08-12 DIAGNOSIS — I129 Hypertensive chronic kidney disease with stage 1 through stage 4 chronic kidney disease, or unspecified chronic kidney disease: Secondary | ICD-10-CM | POA: Diagnosis present

## 2014-08-12 DIAGNOSIS — K219 Gastro-esophageal reflux disease without esophagitis: Secondary | ICD-10-CM | POA: Diagnosis present

## 2014-08-12 DIAGNOSIS — F17210 Nicotine dependence, cigarettes, uncomplicated: Secondary | ICD-10-CM | POA: Diagnosis present

## 2014-08-12 DIAGNOSIS — E1040 Type 1 diabetes mellitus with diabetic neuropathy, unspecified: Secondary | ICD-10-CM | POA: Diagnosis present

## 2014-08-12 DIAGNOSIS — L89159 Pressure ulcer of sacral region, unspecified stage: Secondary | ICD-10-CM

## 2014-08-12 DIAGNOSIS — T40605A Adverse effect of unspecified narcotics, initial encounter: Secondary | ICD-10-CM | POA: Diagnosis not present

## 2014-08-12 DIAGNOSIS — B952 Enterococcus as the cause of diseases classified elsewhere: Secondary | ICD-10-CM | POA: Diagnosis present

## 2014-08-12 DIAGNOSIS — L89219 Pressure ulcer of right hip, unspecified stage: Secondary | ICD-10-CM | POA: Diagnosis present

## 2014-08-12 DIAGNOSIS — G894 Chronic pain syndrome: Secondary | ICD-10-CM | POA: Diagnosis present

## 2014-08-12 DIAGNOSIS — Z8614 Personal history of Methicillin resistant Staphylococcus aureus infection: Secondary | ICD-10-CM

## 2014-08-12 DIAGNOSIS — J9690 Respiratory failure, unspecified, unspecified whether with hypoxia or hypercapnia: Secondary | ICD-10-CM

## 2014-08-12 DIAGNOSIS — J90 Pleural effusion, not elsewhere classified: Secondary | ICD-10-CM | POA: Diagnosis not present

## 2014-08-12 DIAGNOSIS — N390 Urinary tract infection, site not specified: Secondary | ICD-10-CM

## 2014-08-12 DIAGNOSIS — E785 Hyperlipidemia, unspecified: Secondary | ICD-10-CM | POA: Diagnosis present

## 2014-08-12 DIAGNOSIS — R4182 Altered mental status, unspecified: Secondary | ICD-10-CM | POA: Diagnosis present

## 2014-08-12 DIAGNOSIS — R6520 Severe sepsis without septic shock: Secondary | ICD-10-CM | POA: Diagnosis present

## 2014-08-12 DIAGNOSIS — M797 Fibromyalgia: Secondary | ICD-10-CM | POA: Diagnosis present

## 2014-08-12 DIAGNOSIS — E1065 Type 1 diabetes mellitus with hyperglycemia: Secondary | ICD-10-CM | POA: Diagnosis present

## 2014-08-12 DIAGNOSIS — E860 Dehydration: Secondary | ICD-10-CM | POA: Diagnosis present

## 2014-08-12 DIAGNOSIS — A4101 Sepsis due to Methicillin susceptible Staphylococcus aureus: Principal | ICD-10-CM | POA: Diagnosis present

## 2014-08-12 DIAGNOSIS — N179 Acute kidney failure, unspecified: Secondary | ICD-10-CM

## 2014-08-12 DIAGNOSIS — E875 Hyperkalemia: Secondary | ICD-10-CM | POA: Diagnosis not present

## 2014-08-12 DIAGNOSIS — L89319 Pressure ulcer of right buttock, unspecified stage: Secondary | ICD-10-CM | POA: Diagnosis present

## 2014-08-12 DIAGNOSIS — R509 Fever, unspecified: Secondary | ICD-10-CM

## 2014-08-12 LAB — CBC WITH DIFFERENTIAL/PLATELET
BASOS ABS: 0.2 10*3/uL — AB (ref 0.0–0.1)
Band Neutrophils: 4 % (ref 0–10)
Basophils Relative: 1 % (ref 0–1)
EOS ABS: 0.2 10*3/uL (ref 0.0–0.7)
Eosinophils Relative: 1 % (ref 0–5)
HCT: 27.8 % — ABNORMAL LOW (ref 39.0–52.0)
HEMOGLOBIN: 8.8 g/dL — AB (ref 13.0–17.0)
LYMPHS ABS: 2.6 10*3/uL (ref 0.7–4.0)
LYMPHS PCT: 11 % — AB (ref 12–46)
MCH: 25.9 pg — AB (ref 26.0–34.0)
MCHC: 31.7 g/dL (ref 30.0–36.0)
MCV: 81.8 fL (ref 78.0–100.0)
MONO ABS: 0.2 10*3/uL (ref 0.1–1.0)
Monocytes Relative: 1 % — ABNORMAL LOW (ref 3–12)
Neutro Abs: 20.6 10*3/uL — ABNORMAL HIGH (ref 1.7–7.7)
Neutrophils Relative %: 82 % — ABNORMAL HIGH (ref 43–77)
PLATELETS: 425 10*3/uL — AB (ref 150–400)
RBC: 3.4 MIL/uL — ABNORMAL LOW (ref 4.22–5.81)
RDW: 15.7 % — ABNORMAL HIGH (ref 11.5–15.5)
WBC: 23.8 10*3/uL — ABNORMAL HIGH (ref 4.0–10.5)

## 2014-08-12 LAB — I-STAT CG4 LACTIC ACID, ED: LACTIC ACID, VENOUS: 0.63 mmol/L (ref 0.5–2.2)

## 2014-08-12 LAB — COMPREHENSIVE METABOLIC PANEL
ALBUMIN: 2.7 g/dL — AB (ref 3.5–5.2)
ALT: 7 U/L (ref 0–53)
ANION GAP: 14 (ref 5–15)
AST: 10 U/L (ref 0–37)
Alkaline Phosphatase: 169 U/L — ABNORMAL HIGH (ref 39–117)
BUN: 41 mg/dL — AB (ref 6–23)
CALCIUM: 10.3 mg/dL (ref 8.4–10.5)
CO2: 23 mEq/L (ref 19–32)
CREATININE: 2.4 mg/dL — AB (ref 0.50–1.35)
Chloride: 100 mEq/L (ref 96–112)
GFR calc Af Amer: 40 mL/min — ABNORMAL LOW (ref >90)
GFR calc non Af Amer: 35 mL/min — ABNORMAL LOW (ref >90)
GLUCOSE: 301 mg/dL — AB (ref 70–99)
Potassium: 5.5 mEq/L — ABNORMAL HIGH (ref 3.7–5.3)
Sodium: 137 mEq/L (ref 137–147)
TOTAL PROTEIN: 7.9 g/dL (ref 6.0–8.3)
Total Bilirubin: 0.3 mg/dL (ref 0.3–1.2)

## 2014-08-12 LAB — URINALYSIS, ROUTINE W REFLEX MICROSCOPIC
Glucose, UA: NEGATIVE mg/dL
Ketones, ur: NEGATIVE mg/dL
NITRITE: NEGATIVE
Protein, ur: 100 mg/dL — AB
SPECIFIC GRAVITY, URINE: 1.018 (ref 1.005–1.030)
UROBILINOGEN UA: 1 mg/dL (ref 0.0–1.0)
pH: 7 (ref 5.0–8.0)

## 2014-08-12 LAB — LIPASE, BLOOD: LIPASE: 8 U/L — AB (ref 11–59)

## 2014-08-12 LAB — CBG MONITORING, ED: Glucose-Capillary: 261 mg/dL — ABNORMAL HIGH (ref 70–99)

## 2014-08-12 LAB — URINE MICROSCOPIC-ADD ON

## 2014-08-12 MED ORDER — ACETAMINOPHEN 650 MG RE SUPP: 650.0000 mg | Freq: Once | RECTAL | Status: AC

## 2014-08-12 MED ORDER — SODIUM CHLORIDE 0.9 % IV BOLUS (SEPSIS)
2000.0000 mL | Freq: Once | INTRAVENOUS | Status: AC
  Administered 2014-08-12: 2000 mL via INTRAVENOUS

## 2014-08-12 MED ORDER — SODIUM CHLORIDE 0.9 % IV SOLN
1.0000 g | Freq: Once | INTRAVENOUS | Status: AC
  Administered 2014-08-12: 1 g via INTRAVENOUS
  Filled 2014-08-12: qty 10

## 2014-08-12 MED ORDER — IMIPENEM-CILASTATIN 500 MG IV SOLR
500.0000 mg | Freq: Once | INTRAVENOUS | Status: AC
  Administered 2014-08-12: 500 mg via INTRAVENOUS
  Filled 2014-08-12: qty 500

## 2014-08-12 MED ORDER — NALOXONE HCL 0.4 MG/ML IJ SOLN
INTRAMUSCULAR | Status: AC
  Administered 2014-08-12: 0.4 mg
  Filled 2014-08-12: qty 1

## 2014-08-12 MED ORDER — VANCOMYCIN HCL IN DEXTROSE 1-5 GM/200ML-% IV SOLN
1000.0000 mg | Freq: Once | INTRAVENOUS | Status: AC
  Administered 2014-08-12: 1000 mg via INTRAVENOUS
  Filled 2014-08-12: qty 200

## 2014-08-12 MED ORDER — ACETAMINOPHEN 650 MG RE SUPP
650.0000 mg | RECTAL | Status: DC | PRN
  Administered 2014-08-12: 650 mg via RECTAL
  Filled 2014-08-12: qty 1

## 2014-08-12 NOTE — ED Notes (Signed)
Pt given 0.4 mg of narcan, pt now awake and arousable, talking at this time

## 2014-08-12 NOTE — ED Notes (Signed)
ER stay:  Admitted with a resp rate of 8.  IV x  2 started  1 20 rt wrist  2. 20 rt AC.  Narcan 0.4 IV given  resp rate improved and pt now awake.  Spitting up lg amtof sputum> suctioned several times.  Gave pt suction to use when needed,stated he could swallow OK.  Only needed to use it 2-3 times. Complains of pain all over.

## 2014-08-12 NOTE — ED Notes (Signed)
Code sepsis called to [facility] by staff

## 2014-08-12 NOTE — ED Provider Notes (Signed)
CSN: [id number]     Arrival date & time 08/12/14  6378 History   First MD Initiated Contact with Patient 08/12/14 [place]     Chief Complaint  Patient presents with  . Altered Mental Status     (Consider location/radiation/quality/duration/timing/severity/associated sxs/prior Treatment) Patient is a 28 y.o. male presenting with altered mental status. The history is provided by the EMS personnel.  Altered Mental Status Presenting symptoms: behavior changes, lethargy and unresponsiveness   Severity:  Moderate Most recent episode:  Today Timing:  Constant Progression:  Worsening Chronicity:  New Context: recent change in medication    Jason [name] is a 28 y.o. male who presents to the ED via [facility] for altered mental status. Patient is a poorly controlled diabetic who lives with his parents. He has an indwelling foley catheter. Hx of dental surgery a couple weeks ago. He has been taking two antibiotics and pain medication. Per EMS patient developed fever and has had a change in mental status today. Reports that patient has just been sleeping all day.   Past Medical History  Diagnosis Date  . GERD (gastroesophageal reflux disease)   . Asthma   . Hx MRSA infection     on face  . Gastroparesis   . Diabetic neuropathy   . Seizures   . Stroke     spinal stroke in 4/15  . Diabetes mellitus     sees Dr. Loanne Drilling   . Family history of anesthesia complication     Pt mother can't have epidural procedures  . Dysrhythmia   . Pneumonia   . Arthritis   . Fibromyalgia    Past Surgical History  Procedure Laterality Date  . Tonsillectomy    . Multiple extractions with alveoloplasty N/A 08/03/2014    Procedure: MULTIPLE EXTRACTIONS;  Surgeon: Gae Bon, DDS;  Location: [facility];  Service: Oral Surgery;  Laterality: N/A;   Family History  Problem Relation Age of Onset  . Diabetes Father   . Hypertension Father   . Asthma      fhx  . Hypertension      fhx  . Stroke      fhx  .  Heart disease Mother    History  Substance Use Topics  . Smoking status: Current Some Day Smoker    Types: Cigars, Cigarettes    Last Attempt to Quit: 01/29/2014  . Smokeless tobacco: Never Used     Comment: e-cigarette and cigars currently  . Alcohol Use: No     Comment: rarely    Review of Systems  Unable to perform ROS     Allergies  Cefuroxime axetil; Penicillins; Tessalon; and Shellfish allergy  Home Medications   Prior to Admission medications   Medication Sig Start Date End Date Taking? Authorizing Provider  albuterol (PROVENTIL) (2.5 MG/3ML) 0.083% nebulizer solution Take 3 mLs (2.5 mg total) by nebulization every 4 (four) hours as needed for wheezing or shortness of breath. 04/27/14   [name], MD  amLODipine (NORVASC) 2.5 MG tablet Take 1 tablet (2.5 mg total) by mouth daily. 07/30/14   Shanker Kristeen Mans, MD  baclofen (LIORESAL) 10 MG tablet Take 10-20 mg by mouth 3 (three) times daily. Takes 10mg  in morning and lunchtime and takes 20mg  at bedtime    Historical Provider, MD  clindamycin (CLEOCIN) 300 MG capsule Take 1 capsule (300 mg total) by mouth 3 (three) times daily. 08/03/14   Gae Bon, DDS  dexlansoprazole (DEXILANT) 60 MG capsule Take 60 mg  by mouth daily.    Historical Provider, MD  diclofenac sodium (VOLTAREN) 1 % GEL Apply 2 g topically daily as needed (for pain).    Historical Provider, MD  diphenoxylate-atropine (LOMOTIL) 2.5-0.025 MG per tablet Take 1 tablet by mouth 2 (two) times daily. 07/21/14   Willia Craze, NP  fluconazole (DIFLUCAN) 200 MG tablet Take 1 tablet (200 mg total) by mouth daily. 07/21/14   Allie Bossier, MD  furosemide (LASIX) 40 MG tablet Take 0.5 tablets (20 mg total) by mouth daily as needed for fluid. 07/30/14   Shanker Kristeen Mans, MD  glucagon (GLUCAGON EMERGENCY) 1 MG injection Inject 1 mg into the vein once as needed. 04/20/14   [name], MD  glucose blood (ACCU-CHEK AVIVA PLUS) test strip Use to check blood sugar  4 times per day 07/21/14   Renato Shin, MD  insulin aspart (NOVOLOG) 100 UNIT/ML injection Inject 5 Units into the skin 3 (three) times daily with meals. 07/21/14   Renato Shin, MD  insulin glargine (LANTUS) 100 UNIT/ML injection Inject 0.22 mLs (22 Units total) into the skin at bedtime. 07/21/14   Renato Shin, MD  loratadine (CLARITIN) 10 MG tablet Take 10 mg by mouth 2 (two) times daily as needed for allergies.    Historical Provider, MD  metoCLOPramide (REGLAN) 5 MG tablet Take 1 tablet (5 mg total) by mouth 2 (two) times a week. And as needed for nausea 07/01/14   Meredith Staggers, MD  morphine (MS CONTIN) 30 MG 12 hr tablet Take 1 tablet (30 mg total) by mouth every 12 (twelve) hours. 07/16/14   Meredith Staggers, MD  OxyCODONE (OXYCONTIN) 15 mg T12A 12 hr tablet Take 15 mg by mouth every 12 (twelve) hours.    Historical Provider, MD  oxyCODONE-acetaminophen (PERCOCET) 10-325 MG per tablet Take 1 tablet by mouth every 8 (eight) hours as needed for pain. 07/16/14   Meredith Staggers, MD  pregabalin (LYRICA) 100 MG capsule Take 100-200 mg by mouth 4 (four) times daily. Take 100 mg by mouth in the morning, take 100 mg by mouth in the afternoon, take 100 mg by mouth in the evening and take 200 mg by mouth at bedtime.    Historical Provider, MD  silver sulfADIAZINE (SILVADENE) 1 % cream Apply 1 application topically 2 (two) times a week. Tuesday and thursday    Historical Provider, MD   BP 123/65 mmHg  Pulse 102  Temp(Src) 101.3 F (38.5 C) (Oral)  Resp 16  Wt 125 lb (56.7 kg)  SpO2 91% Physical Exam  HENT:  Head: Atraumatic.  Multiple teeth extractions recently. No drainage from the sites.   Neck: Neck supple.  Cardiovascular: Normal rate.   Pulmonary/Chest: He has wheezes. He has rales.  Abdominal: There is tenderness. There is guarding.  Genitourinary:  Indwelling foley catheter with cloudy urine.   Neurological: He is unresponsive.  On arrival to the ED the patient has a fever of 101.3  and respirations 8. He is unresponsive to painful stimuli. Blood drawn and IV inserted without patient waking. Patient given Narcan and after Narcan is able to communicate verbally.  He states he has had a cough and abdominal pain.   ED Course  Procedures (including critical care time) Labs Review Results for orders placed or performed during the hospital encounter of 08/12/14 (from the past 24 hour(s))  CBC WITH DIFFERENTIAL     Status: Abnormal   Collection Time: 08/12/14  6:40 PM  Result Value Ref  Range   WBC 23.8 (H) 4.0 - 10.5 K/uL   RBC 3.40 (L) 4.22 - 5.81 MIL/uL   Hemoglobin 8.8 (L) 13.0 - 17.0 g/dL   HCT 27.8 (L) 39.0 - 52.0 %   MCV 81.8 78.0 - 100.0 fL   MCH 25.9 (L) 26.0 - 34.0 pg   MCHC 31.7 30.0 - 36.0 g/dL   RDW 15.7 (H) 11.5 - 15.5 %   Platelets 425 (H) 150 - 400 K/uL   Neutrophils Relative % 82 (H) 43 - 77 %   Lymphocytes Relative 11 (L) 12 - 46 %   Monocytes Relative 1 (L) 3 - 12 %   Eosinophils Relative 1 0 - 5 %   Basophils Relative 1 0 - 1 %   Band Neutrophils 4 0 - 10 %   Neutro Abs 20.6 (H) 1.7 - 7.7 K/uL   Lymphs Abs 2.6 0.7 - 4.0 K/uL   Monocytes Absolute 0.2 0.1 - 1.0 K/uL   Eosinophils Absolute 0.2 0.0 - 0.7 K/uL   Basophils Absolute 0.2 (H) 0.0 - 0.1 K/uL   RBC Morphology SPHEROCYTES    WBC Morphology ATYPICAL LYMPHOCYTES    Smear Review LARGE PLATELETS PRESENT   Comprehensive metabolic panel     Status: Abnormal   Collection Time: 08/12/14  6:40 PM  Result Value Ref Range   Sodium 137 137 - 147 mEq/L   Potassium 5.5 (H) 3.7 - 5.3 mEq/L   Chloride 100 96 - 112 mEq/L   CO2 23 19 - 32 mEq/L   Glucose, Bld 301 (H) 70 - 99 mg/dL   BUN 41 (H) 6 - 23 mg/dL   Creatinine, Ser 2.40 (H) 0.50 - 1.35 mg/dL   Calcium 10.3 8.4 - 10.5 mg/dL   Total Protein 7.9 6.0 - 8.3 g/dL   Albumin 2.7 (L) 3.5 - 5.2 g/dL   AST 10 0 - 37 U/L   ALT 7 0 - 53 U/L   Alkaline Phosphatase 169 (H) 39 - 117 U/L   Total Bilirubin 0.3 0.3 - 1.2 mg/dL   GFR calc non Af Amer 35 (L)  >90 mL/min   GFR calc Af Amer 40 (L) >90 mL/min   Anion gap 14 5 - 15  I-Stat CG4 Lactic Acid, ED     Status: None   Collection Time: 08/12/14  6:40 PM  Result Value Ref Range   Lactic Acid, Venous 0.63 0.5 - 2.2 mmol/L  Urinalysis, Routine w reflex microscopic     Status: Abnormal   Collection Time: 08/12/14  6:40 PM  Result Value Ref Range   Color, Urine YELLOW YELLOW   APPearance TURBID (A) CLEAR   Specific Gravity, Urine 1.018 1.005 - 1.030   pH 7.0 5.0 - 8.0   Glucose, UA NEGATIVE NEGATIVE mg/dL   Hgb urine dipstick LARGE (A) NEGATIVE   Bilirubin Urine SMALL (A) NEGATIVE   Ketones, ur NEGATIVE NEGATIVE mg/dL   Protein, ur 100 (A) NEGATIVE mg/dL   Urobilinogen, UA 1.0 0.0 - 1.0 mg/dL   Nitrite NEGATIVE NEGATIVE   Leukocytes, UA LARGE (A) NEGATIVE  Lipase, blood     Status: Abnormal   Collection Time: 08/12/14  6:40 PM  Result Value Ref Range   Lipase 8 (L) 11 - 59 U/L  Urine microscopic-add on     Status: Abnormal   Collection Time: 08/12/14  6:40 PM  Result Value Ref Range   Squamous Epithelial / LPF FEW (A) RARE   WBC, UA TOO NUMEROUS TO COUNT <3  WBC/hpf   RBC / HPF 11-20 <3 RBC/hpf   Bacteria, UA MANY (A) RARE   Dg Chest Port 1 View  08/12/2014   CLINICAL DATA:  Fever R50.9 (ICD-10-CM). Altered mental status. Recent dental surgery.  EXAM: PORTABLE CHEST - 1 VIEW  COMPARISON:  07/29/2014  FINDINGS: New subtle densities at the left lung base. Heart size is normal. Trachea is midline. Negative for a pneumothorax.  IMPRESSION: Subtle densities at the left lung base. Findings most likely represent atelectasis.   Electronically Signed   By: Markus Daft M.D.   On: 08/12/2014 19:08    EKG Interpretation  Sinus tachycardia Otherwise normal ECG Since last tracing rate faster Confirmed by YAO  MD, DAVID ([id number]) on 08/12/2014 7:34:12 PM  MDM  28 y.o. male with multiple medical problems arrived via EMS with decreased LOC that started earlier today and has progressed. Patient more alert  after Narcan. Continues to appear septic. Dr. Darl Householder in to examine the patient and will assume care and discuss with admitting physician for transfer.     [facility] Bunnie Pion, NP 08/12/14 2000  Wandra Arthurs, MD 08/13/14 [phone]

## 2014-08-12 NOTE — ED Notes (Signed)
Pt brought in by [facility], with altered mental status x today. Pt with respirations of 8 on arrival. Pt has foley in place. Pt with recent dental surgery, on clindamycin and another ABX. Reports fever up to 101.7 orally today. Motrin 400 mg given 4 hours ago. Reports pt is A&O x 4. Pt not arousable to painful stimuli. Reports decreased urine output in the past day

## 2014-08-12 NOTE — Telephone Encounter (Signed)
Patient Information:  Caller Name: Treasa School  Phone: [phone]  Patient: Jason [name], Jason [name]  Gender: Male  DOB: [birth year]  Age: 28 Years  PCP: [name] [facility])  Office Follow Up:  Does the office need to follow up with this patient?: Yes  Instructions For The Office: Mother states that they normally have to call EMS for ED patient that need to be seen today . Arranged Transportation is 3 days.  Please review. Patient with diffuse symptoms. Seeking guidance.  Please contact.  RN Note:  Mother states that they normally have to call EMS for ED patient that need to be seen today . Arranged Transportation is 3 days.  Please review. Patient with diffuse symptoms. Seeking guidance.  Please contact.  Symptoms  Reason For Call & Symptoms: Mother states that Darryn  (1)  temperature is 101.7 (o) . onset today. He is a paraplegia. (2)  Dark Urine in Foley catheter (last changed-08/03/2014). Chronic back pain (3)   Oral surgery on 08/03/14 - x7 teeth removed.  He has completed clindamycin and he has had decreased eating and drinking.  (4)  He has blisters on his left hand knuckle area.  (thumb, index finger and middle finger) onset 08/10/14 . Small blister today on stomach. NO sores on his mouth or lips. hands are  peeling.   Mother thinks he may have another yeast infection.    He has started Lipitor 07/22/14  and Amlodipine 07/29/14.  Reviewed Health History In EMR: Yes  Reviewed Medications In EMR: Yes  Reviewed Allergies In EMR: Yes  Reviewed Surgeries / Procedures: Yes  Date of Onset of Symptoms: 08/10/2014  Any Fever: Yes  Fever Taken: Oral  Fever Time Of Reading: 16:00:00  Fever Last Reading: 101.7  Guideline(s) Used:  Urination Pain - Male  Rash or Redness - Localized  Disposition Per Guideline:   Go to Office Now  Reason For Disposition Reached:   Fever > 100.5 F (38.1 C)  Advice Given:  Call Back If:   Fever lasts more than 24 hours on antibiotics  You become  worse.  Fluids  : Drink extra fluids (Reason: to produce a dilute, nonirritating urine).  RN Overrode Recommendation:  Document Patient  Mother states that they normally have to call EMS for ED patient that need to be seen today . Arranged Transportation is 3 days.  Please review. Patient with diffuse symptoms. Seeking guidance.  Please contact.

## 2014-08-13 ENCOUNTER — Inpatient Hospital Stay (HOSPITAL_COMMUNITY): Payer: Medicaid Other

## 2014-08-13 DIAGNOSIS — N390 Urinary tract infection, site not specified: Secondary | ICD-10-CM | POA: Insufficient documentation

## 2014-08-13 DIAGNOSIS — J9602 Acute respiratory failure with hypercapnia: Secondary | ICD-10-CM | POA: Diagnosis present

## 2014-08-13 DIAGNOSIS — N179 Acute kidney failure, unspecified: Secondary | ICD-10-CM | POA: Insufficient documentation

## 2014-08-13 DIAGNOSIS — J189 Pneumonia, unspecified organism: Secondary | ICD-10-CM | POA: Insufficient documentation

## 2014-08-13 DIAGNOSIS — J690 Pneumonitis due to inhalation of food and vomit: Secondary | ICD-10-CM

## 2014-08-13 DIAGNOSIS — A419 Sepsis, unspecified organism: Secondary | ICD-10-CM

## 2014-08-13 DIAGNOSIS — R319 Hematuria, unspecified: Secondary | ICD-10-CM

## 2014-08-13 DIAGNOSIS — N189 Chronic kidney disease, unspecified: Secondary | ICD-10-CM

## 2014-08-13 LAB — POCT I-STAT 3, ART BLOOD GAS (G3+)
Acid-base deficit: 9 mmol/L — ABNORMAL HIGH (ref 0.0–2.0)
Bicarbonate: 16.5 mEq/L — ABNORMAL LOW (ref 20.0–24.0)
O2 Saturation: 92 %
PH ART: 7.333 — AB (ref 7.350–7.450)
Patient temperature: 98.6
TCO2: 17 mmol/L (ref 0–100)
pCO2 arterial: 31 mmHg — ABNORMAL LOW (ref 35.0–45.0)
pO2, Arterial: 66 mmHg — ABNORMAL LOW (ref 80.0–100.0)

## 2014-08-13 LAB — BLOOD GAS, ARTERIAL
ACID-BASE DEFICIT: 9.2 mmol/L — AB (ref 0.0–2.0)
Acid-base deficit: 8.8 mmol/L — ABNORMAL HIGH (ref 0.0–2.0)
Bicarbonate: 18.6 mEq/L — ABNORMAL LOW (ref 20.0–24.0)
Bicarbonate: 19.3 mEq/L — ABNORMAL LOW (ref 20.0–24.0)
DRAWN BY: 39898
DRAWN BY: 39898
Delivery systems: POSITIVE
Delivery systems: POSITIVE
EXPIRATORY PAP: 8
Expiratory PAP: 8
FIO2: 0.8 %
FIO2: 0.8 %
INSPIRATORY PAP: 16
Inspiratory PAP: 16
O2 SAT: 92.9 %
O2 SAT: 91.8 %
PATIENT TEMPERATURE: 101.3
PCO2 ART: 72.6 mmHg — AB (ref 35.0–45.0)
PH ART: 7.151 — AB (ref 7.350–7.450)
PO2 ART: 86.8 mmHg (ref 80.0–100.0)
Patient temperature: 98.6
RATE: 12 resp/min
RATE: 12 resp/min
TCO2: 20.3 mmol/L (ref 0–100)
TCO2: 21.3 mmol/L (ref 0–100)
pCO2 arterial: 55.4 mmHg — ABNORMAL HIGH (ref 35.0–45.0)
pH, Arterial: 7.064 — CL (ref 7.350–7.450)
pO2, Arterial: 93.8 mmHg (ref 80.0–100.0)

## 2014-08-13 LAB — BASIC METABOLIC PANEL
Anion gap: 16 — ABNORMAL HIGH (ref 5–15)
BUN: 34 mg/dL — AB (ref 6–23)
CO2: 18 mEq/L — ABNORMAL LOW (ref 19–32)
Calcium: 9.4 mg/dL (ref 8.4–10.5)
Chloride: 104 mEq/L (ref 96–112)
Creatinine, Ser: 1.6 mg/dL — ABNORMAL HIGH (ref 0.50–1.35)
GFR calc Af Amer: 65 mL/min — ABNORMAL LOW (ref >90)
GFR calc non Af Amer: 56 mL/min — ABNORMAL LOW (ref >90)
GLUCOSE: 271 mg/dL — AB (ref 70–99)
POTASSIUM: 4.3 meq/L (ref 3.7–5.3)
Sodium: 138 mEq/L (ref 137–147)

## 2014-08-13 LAB — GLUCOSE, CAPILLARY
GLUCOSE-CAPILLARY: 197 mg/dL — AB (ref 70–99)
GLUCOSE-CAPILLARY: 200 mg/dL — AB (ref 70–99)
Glucose-Capillary: 245 mg/dL — ABNORMAL HIGH (ref 70–99)
Glucose-Capillary: 279 mg/dL — ABNORMAL HIGH (ref 70–99)
Glucose-Capillary: 291 mg/dL — ABNORMAL HIGH (ref 70–99)

## 2014-08-13 LAB — CBC
HCT: 26.8 % — ABNORMAL LOW (ref 39.0–52.0)
HEMOGLOBIN: 8.5 g/dL — AB (ref 13.0–17.0)
MCH: 25.8 pg — AB (ref 26.0–34.0)
MCHC: 31.7 g/dL (ref 30.0–36.0)
MCV: 81.5 fL (ref 78.0–100.0)
Platelets: 420 10*3/uL — ABNORMAL HIGH (ref 150–400)
RBC: 3.29 MIL/uL — ABNORMAL LOW (ref 4.22–5.81)
RDW: 15.9 % — ABNORMAL HIGH (ref 11.5–15.5)
WBC: 30 10*3/uL — AB (ref 4.0–10.5)

## 2014-08-13 LAB — LACTIC ACID, PLASMA: Lactic Acid, Venous: 1.8 mmol/L (ref 0.5–2.2)

## 2014-08-13 LAB — MRSA PCR SCREENING: MRSA by PCR: NEGATIVE

## 2014-08-13 MED ORDER — LIDOCAINE HCL (CARDIAC) 20 MG/ML IV SOLN
INTRAVENOUS | Status: AC
  Filled 2014-08-13: qty 5

## 2014-08-13 MED ORDER — ETOMIDATE 2 MG/ML IV SOLN
INTRAVENOUS | Status: AC
  Administered 2014-08-13: 20 mg
  Filled 2014-08-13: qty 20

## 2014-08-13 MED ORDER — FENTANYL CITRATE 0.05 MG/ML IJ SOLN: 50.0000 ug | Freq: Once | INTRAMUSCULAR | Status: DC

## 2014-08-13 MED ORDER — DEXTROSE 5 % IV SOLN
1.0000 g | Freq: Three times a day (TID) | INTRAVENOUS | Status: DC
  Administered 2014-08-13 – 2014-08-14 (×3): 1 g via INTRAVENOUS
  Filled 2014-08-13 (×5): qty 1

## 2014-08-13 MED ORDER — FENTANYL CITRATE 0.05 MG/ML IJ SOLN
25.0000 ug | INTRAMUSCULAR | Status: DC | PRN
  Administered 2014-08-14 (×3): 50 ug via INTRAVENOUS
  Filled 2014-08-13 (×3): qty 2

## 2014-08-13 MED ORDER — ALBUTEROL SULFATE (2.5 MG/3ML) 0.083% IN NEBU
2.5000 mg | INHALATION_SOLUTION | RESPIRATORY_TRACT | Status: DC | PRN
  Administered 2014-08-13: 2.5 mg via RESPIRATORY_TRACT

## 2014-08-13 MED ORDER — ONDANSETRON HCL 4 MG PO TABS: 4.0000 mg | ORAL_TABLET | Freq: Four times a day (QID) | ORAL | Status: DC | PRN

## 2014-08-13 MED ORDER — MIDAZOLAM HCL 2 MG/2ML IJ SOLN
INTRAMUSCULAR | Status: AC
  Administered 2014-08-13: 2 mg
  Filled 2014-08-13: qty 2

## 2014-08-13 MED ORDER — VANCOMYCIN HCL IN DEXTROSE 750-5 MG/150ML-% IV SOLN: 750.0000 mg | INTRAVENOUS | Status: DC

## 2014-08-13 MED ORDER — SODIUM CHLORIDE 0.9 % IV SOLN
INTRAVENOUS | Status: DC
  Administered 2014-08-13: 02:00:00 via INTRAVENOUS

## 2014-08-13 MED ORDER — SODIUM CHLORIDE 0.9 % IV BOLUS (SEPSIS)
500.0000 mL | Freq: Once | INTRAVENOUS | Status: AC
  Administered 2014-08-13: 500 mL via INTRAVENOUS

## 2014-08-13 MED ORDER — VITAL HIGH PROTEIN PO LIQD
1000.0000 mL | ORAL | Status: DC
  Filled 2014-08-13 (×2): qty 1000

## 2014-08-13 MED ORDER — PHENYLEPHRINE HCL 10 MG/ML IJ SOLN
30.0000 ug/min | INTRAMUSCULAR | Status: DC
  Filled 2014-08-13: qty 1

## 2014-08-13 MED ORDER — SODIUM CHLORIDE 0.9 % IV SOLN
INTRAVENOUS | Status: DC
Start: 2014-08-13 — End: 2014-08-13

## 2014-08-13 MED ORDER — VITAL AF 1.2 CAL PO LIQD
1000.0000 mL | ORAL | Status: DC
  Administered 2014-08-13: 1000 mL
  Filled 2014-08-13 (×5): qty 1000

## 2014-08-13 MED ORDER — SODIUM CHLORIDE 0.9 % IJ SOLN
3.0000 mL | Freq: Two times a day (BID) | INTRAMUSCULAR | Status: DC
  Administered 2014-08-13 – 2014-08-22 (×15): 3 mL via INTRAVENOUS

## 2014-08-13 MED ORDER — FENTANYL BOLUS VIA INFUSION
50.0000 ug | INTRAVENOUS | Status: DC | PRN
  Filled 2014-08-13: qty 100

## 2014-08-13 MED ORDER — ACETAMINOPHEN 160 MG/5ML PO SOLN
650.0000 mg | ORAL | Status: DC | PRN
Start: 2014-08-13 — End: 2014-08-14
  Administered 2014-08-13 – 2014-08-14 (×2): 650 mg
  Filled 2014-08-13 (×2): qty 20.3

## 2014-08-13 MED ORDER — FENTANYL 100 MCG/HR TD PT72
100.0000 ug | MEDICATED_PATCH | TRANSDERMAL | Status: DC
  Administered 2014-08-13: 100 ug via TRANSDERMAL
  Filled 2014-08-13: qty 1

## 2014-08-13 MED ORDER — INSULIN ASPART 100 UNIT/ML ~~LOC~~ SOLN
0.0000 [IU] | Freq: Three times a day (TID) | SUBCUTANEOUS | Status: DC
  Administered 2014-08-13: 5 [IU] via SUBCUTANEOUS

## 2014-08-13 MED ORDER — DEXMEDETOMIDINE HCL IN NACL 200 MCG/50ML IV SOLN
0.4000 ug/kg/h | INTRAVENOUS | Status: DC
  Administered 2014-08-13: 0.4 ug/kg/h via INTRAVENOUS
  Filled 2014-08-13: qty 50

## 2014-08-13 MED ORDER — ROCURONIUM BROMIDE 50 MG/5ML IV SOLN
INTRAVENOUS | Status: AC
  Filled 2014-08-13: qty 2

## 2014-08-13 MED ORDER — PANTOPRAZOLE SODIUM 40 MG PO PACK
40.0000 mg | PACK | Freq: Every day | ORAL | Status: DC
  Administered 2014-08-13: 40 mg
  Filled 2014-08-13 (×2): qty 20

## 2014-08-13 MED ORDER — MEROPENEM 1 G IV SOLR
1.0000 g | Freq: Two times a day (BID) | INTRAVENOUS | Status: DC
  Administered 2014-08-13: 1 g via INTRAVENOUS
  Filled 2014-08-13 (×2): qty 1

## 2014-08-13 MED ORDER — FLUCONAZOLE IN SODIUM CHLORIDE 200-0.9 MG/100ML-% IV SOLN
200.0000 mg | INTRAVENOUS | Status: DC
  Administered 2014-08-13: 200 mg via INTRAVENOUS
  Filled 2014-08-13: qty 100

## 2014-08-13 MED ORDER — INSULIN ASPART 100 UNIT/ML ~~LOC~~ SOLN
0.0000 [IU] | Freq: Every day | SUBCUTANEOUS | Status: DC
  Administered 2014-08-13: 2 [IU] via SUBCUTANEOUS

## 2014-08-13 MED ORDER — COLLAGENASE 250 UNIT/GM EX OINT
TOPICAL_OINTMENT | Freq: Every day | CUTANEOUS | Status: DC
Start: 2014-08-13 — End: 2014-08-26
  Administered 2014-08-13: 12:00:00 via TOPICAL
  Administered 2014-08-14: 1 via TOPICAL
  Administered 2014-08-15 – 2014-08-26 (×11): via TOPICAL
  Filled 2014-08-13: qty 30

## 2014-08-13 MED ORDER — MIDAZOLAM HCL 2 MG/2ML IJ SOLN
2.0000 mg | INTRAMUSCULAR | Status: DC | PRN
  Administered 2014-08-13: 2 mg via INTRAVENOUS
  Filled 2014-08-13: qty 2

## 2014-08-13 MED ORDER — SODIUM CHLORIDE 0.9 % IV SOLN
500.0000 mg | Freq: Two times a day (BID) | INTRAVENOUS | Status: DC
  Administered 2014-08-13 – 2014-08-14 (×4): 500 mg via INTRAVENOUS
  Filled 2014-08-13 (×6): qty 500

## 2014-08-13 MED ORDER — CHLORHEXIDINE GLUCONATE 0.12 % MT SOLN
15.0000 mL | Freq: Two times a day (BID) | OROMUCOSAL | Status: DC
  Administered 2014-08-13 – 2014-08-17 (×8): 15 mL via OROMUCOSAL
  Filled 2014-08-13 (×10): qty 15

## 2014-08-13 MED ORDER — INSULIN GLARGINE 100 UNIT/ML ~~LOC~~ SOLN
15.0000 [IU] | Freq: Every day | SUBCUTANEOUS | Status: DC
  Administered 2014-08-13 – 2014-08-21 (×9): 15 [IU] via SUBCUTANEOUS
  Filled 2014-08-13 (×9): qty 0.15

## 2014-08-13 MED ORDER — SODIUM CHLORIDE 0.9 % IV SOLN
20.0000 ug/h | INTRAVENOUS | Status: AC
  Administered 2014-08-13: 50 ug/h via INTRAVENOUS
  Filled 2014-08-13: qty 50

## 2014-08-13 MED ORDER — BUDESONIDE 0.25 MG/2ML IN SUSP
0.2500 mg | Freq: Four times a day (QID) | RESPIRATORY_TRACT | Status: DC
Start: 2014-08-13 — End: 2014-08-14
  Administered 2014-08-13 – 2014-08-14 (×2): 0.25 mg via RESPIRATORY_TRACT
  Filled 2014-08-13 (×7): qty 2

## 2014-08-13 MED ORDER — INSULIN ASPART 100 UNIT/ML ~~LOC~~ SOLN
0.0000 [IU] | SUBCUTANEOUS | Status: DC
  Administered 2014-08-13: 2 [IU] via SUBCUTANEOUS
  Administered 2014-08-13: 5 [IU] via SUBCUTANEOUS
  Administered 2014-08-13: 3 [IU] via SUBCUTANEOUS
  Administered 2014-08-13: 2 [IU] via SUBCUTANEOUS
  Administered 2014-08-14: 3 [IU] via SUBCUTANEOUS
  Administered 2014-08-14 (×2): 5 [IU] via SUBCUTANEOUS

## 2014-08-13 MED ORDER — ALBUTEROL SULFATE (2.5 MG/3ML) 0.083% IN NEBU
2.5000 mg | INHALATION_SOLUTION | Freq: Four times a day (QID) | RESPIRATORY_TRACT | Status: DC
  Administered 2014-08-13 – 2014-08-17 (×17): 2.5 mg via RESPIRATORY_TRACT
  Filled 2014-08-13 (×19): qty 3

## 2014-08-13 MED ORDER — SUCCINYLCHOLINE CHLORIDE 20 MG/ML IJ SOLN
INTRAMUSCULAR | Status: AC
  Filled 2014-08-13: qty 1

## 2014-08-13 MED ORDER — CETYLPYRIDINIUM CHLORIDE 0.05 % MT LIQD
7.0000 mL | Freq: Four times a day (QID) | OROMUCOSAL | Status: DC
  Administered 2014-08-13 – 2014-08-17 (×12): 7 mL via OROMUCOSAL

## 2014-08-13 MED ORDER — FREE WATER
200.0000 mL | Freq: Three times a day (TID) | Status: DC
  Administered 2014-08-13 – 2014-08-14 (×2): 200 mL

## 2014-08-13 MED ORDER — ONDANSETRON HCL 4 MG/2ML IJ SOLN
4.0000 mg | Freq: Four times a day (QID) | INTRAMUSCULAR | Status: DC | PRN
  Administered 2014-08-23: 4 mg via INTRAVENOUS
  Filled 2014-08-13: qty 2

## 2014-08-13 MED ORDER — FENTANYL CITRATE 0.05 MG/ML IJ SOLN
INTRAMUSCULAR | Status: AC
  Administered 2014-08-13: 100 ug
  Filled 2014-08-13: qty 2

## 2014-08-13 MED ORDER — LEVOFLOXACIN IN D5W 750 MG/150ML IV SOLN
750.0000 mg | INTRAVENOUS | Status: DC
  Administered 2014-08-13: 750 mg via INTRAVENOUS
  Filled 2014-08-13: qty 150

## 2014-08-13 MED ORDER — HEPARIN SODIUM (PORCINE) 5000 UNIT/ML IJ SOLN
5000.0000 [IU] | Freq: Three times a day (TID) | INTRAMUSCULAR | Status: DC
  Administered 2014-08-13 – 2014-08-14 (×4): 5000 [IU] via SUBCUTANEOUS
  Filled 2014-08-13 (×10): qty 1

## 2014-08-13 MED ORDER — MIDAZOLAM HCL 2 MG/2ML IJ SOLN: 2.0000 mg | INTRAMUSCULAR | Status: DC | PRN

## 2014-08-13 NOTE — Progress Notes (Signed)
Utilization review completed. Arianny Pun, RN, BSN. 

## 2014-08-13 NOTE — H&P (Signed)
[facility]                                                                                    Patient Demographics  Jason [name], is a 28 y.o. male  CSN: [id number]  MRN: [id number]  DOB - 11-29-1985  Admit Date - 08/12/2014  Outpatient Primary MD for the patient is [name], MD   With History of -  Past Medical History  Diagnosis Date  . GERD (gastroesophageal reflux disease)   . Asthma   . Hx MRSA infection     on face  . Gastroparesis   . Diabetic neuropathy   . Seizures   . Stroke     spinal stroke in 4/15  . Diabetes mellitus     sees Dr. Loanne Drilling   . Family history of anesthesia complication     Pt mother can't have epidural procedures  . Dysrhythmia   . Pneumonia   . Arthritis   . Fibromyalgia       Past Surgical History  Procedure Laterality Date  . Tonsillectomy    . Multiple extractions with alveoloplasty N/A 08/03/2014    Procedure: MULTIPLE EXTRACTIONS;  Surgeon: Gae Bon, DDS;  Location: [place];  Service: Oral Surgery;  Laterality: N/A;    in for   Chief Complaint  Patient presents with  . Altered Mental Status     HPI  Jason [name]  is a 28 y.o. male, with history of functional quadriplegia status post spinal infarct in the past who was brought by EMS squad to [facility] because of altered mental status and fever. Patient was started on vancomycin and meropenem in addition to IV fluids and transferred to our hospital. Patient is confused and cannot give anysignificant history.patient was on BiPAP and initially refused intubation for airway protection.patient was found to be hypothermic and he was started on bear hugger Called patient. Advised patient of provider's approval for requested procedure, as well as any comments/instructions from provider. Patient had 7 of is the removed 2 weeks ago.patient's respiratory rate was 7 and he received Narcan in the emergency room 2 prior To transfer       Review of Systems    Unable to obtain due to patient's condition ISocial History History  Substance Use Topics  . Smoking status: Current Some Day Smoker    Types: Cigars, Cigarettes    Last Attempt to Quit: 01/29/2014  . Smokeless tobacco: Never Used     Comment: e-cigarette and cigars currently  . Alcohol Use: No     Comment: rarely     Family History Family History  Problem Relation Age of Onset  . Diabetes Father   . Hypertension Father   . Asthma      fhx  . Hypertension      fhx  . Stroke      fhx  . Heart disease Mother      Prior to Admission medications   Medication Sig Start Date End Date Taking? Authorizing Provider  albuterol (PROVENTIL) (2.5 MG/3ML) 0.083% nebulizer solution Take 3 mLs (2.5 mg total) by nebulization every 4 (four) hours as needed for wheezing or shortness  of breath. 04/27/14   [name], MD  amLODipine (NORVASC) 2.5 MG tablet Take 1 tablet (2.5 mg total) by mouth daily. 07/30/14   Shanker Kristeen Mans, MD  baclofen (LIORESAL) 10 MG tablet Take 10-20 mg by mouth 3 (three) times daily. Takes 10mg  in morning and lunchtime and takes 20mg  at bedtime    Historical Provider, MD  clindamycin (CLEOCIN) 300 MG capsule Take 1 capsule (300 mg total) by mouth 3 (three) times daily. 08/03/14   Gae Bon, DDS  dexlansoprazole (DEXILANT) 60 MG capsule Take 60 mg by mouth daily.    Historical Provider, MD  diclofenac sodium (VOLTAREN) 1 % GEL Apply 2 g topically daily as needed (for pain).    Historical Provider, MD  diphenoxylate-atropine (LOMOTIL) 2.5-0.025 MG per tablet Take 1 tablet by mouth 2 (two) times daily. 07/21/14   Willia Craze, NP  fluconazole (DIFLUCAN) 200 MG tablet Take 1 tablet (200 mg total) by mouth daily. 07/21/14   Allie Bossier, MD  furosemide (LASIX) 40 MG tablet Take 0.5 tablets (20 mg total) by mouth daily as needed for fluid. 07/30/14   Shanker Kristeen Mans, MD  glucagon (GLUCAGON EMERGENCY) 1 MG injection Inject 1 mg into the vein once as needed.  04/20/14   [name], MD  glucose blood (ACCU-CHEK AVIVA PLUS) test strip Use to check blood sugar 4 times per day 07/21/14   Renato Shin, MD  insulin aspart (NOVOLOG) 100 UNIT/ML injection Inject 5 Units into the skin 3 (three) times daily with meals. 07/21/14   Renato Shin, MD  insulin glargine (LANTUS) 100 UNIT/ML injection Inject 0.22 mLs (22 Units total) into the skin at bedtime. 07/21/14   Renato Shin, MD  loratadine (CLARITIN) 10 MG tablet Take 10 mg by mouth 2 (two) times daily as needed for allergies.    Historical Provider, MD  metoCLOPramide (REGLAN) 5 MG tablet Take 1 tablet (5 mg total) by mouth 2 (two) times a week. And as needed for nausea 07/01/14   Meredith Staggers, MD  morphine (MS CONTIN) 30 MG 12 hr tablet Take 1 tablet (30 mg total) by mouth every 12 (twelve) hours. 07/16/14   Meredith Staggers, MD  OxyCODONE (OXYCONTIN) 15 mg T12A 12 hr tablet Take 15 mg by mouth every 12 (twelve) hours.    Historical Provider, MD  oxyCODONE-acetaminophen (PERCOCET) 10-325 MG per tablet Take 1 tablet by mouth every 8 (eight) hours as needed for pain. 07/16/14   Meredith Staggers, MD  pregabalin (LYRICA) 100 MG capsule Take 100-200 mg by mouth 4 (four) times daily. Take 100 mg by mouth in the morning, take 100 mg by mouth in the afternoon, take 100 mg by mouth in the evening and take 200 mg by mouth at bedtime.    Historical Provider, MD  silver sulfADIAZINE (SILVADENE) 1 % cream Apply 1 application topically 2 (two) times a week. Tuesday and thursday    Historical Provider, MD    Allergies  Allergen Reactions  . Cefuroxime Axetil Anaphylaxis  . Penicillins Anaphylaxis and Other (See Comments)    ?can take amoxicillin?  [name] [Benzonatate] Anaphylaxis  . Shellfish Allergy Itching and Other (See Comments)    Took benadryl to alleviate reaction    Physical Exam  Vitals  Blood pressure 109/69, pulse 86, temperature 101.3 F (38.5 C), temperature source Oral, resp. rate 18, weight  56.7 kg (125 lb), SpO2 96 %.   1. General well-developed male obtunded in bed, arousable ,responds  to simple commands   3. functionallyQuadriplegic but can move shoulders but not tense  4. Ears and Eyes appear Normal, Conjunctivae clear, PERRLA. Dry Oral Mucosa.BiPAP noted  5. Supple Neck, No JVD, No cervical lymphadenopathy appriciated, No Carotid Bruits.  6. Symmetrical Chest wall movement, Good air movement bilaterally, CTAB.  7. RRR, No Gallops, Rubs or Murmurs, No Parasternal Heave.  8. Positive Bowel Sounds, Abdomen Soft, Non tender, No organomegaly appriciated,No rebound -guarding or rigidity.  9.  No Cyanosis, Normal Skin Turgor, No Skin Rash or Bruise.  10. Good muscle tone,   Data Review  CBC  Recent Labs Lab 08/12/14 1840  WBC 23.8*  HGB 8.8*  HCT 27.8*  PLT 425*  MCV 81.8  MCH 25.9*  MCHC 31.7  RDW 15.7*  LYMPHSABS 2.6  MONOABS 0.2  EOSABS 0.2  BASOSABS 0.2*   ------------------------------------------------------------------------------------------------------------------  Chemistries   Recent Labs Lab 08/12/14 1840  NA 137  K 5.5*  CL 100  CO2 23  GLUCOSE 301*  BUN 41*  CREATININE 2.40*  CALCIUM 10.3  AST 10  ALT 7  ALKPHOS 169*  BILITOT 0.3   ------------------------------------------------------------------------------------------------------------------ estimated creatinine clearance is 36.1 mL/min (by C-G formula based on Cr of 2.4). ------------------------------------------------------------------------------------------------------------------ No results for input(s): TSH, T4TOTAL, T3FREE, THYROIDAB in the last 72 hours.  Invalid input(s): FREET3   Coagulation profile No results for input(s): INR, PROTIME in the last 168 hours. ------------------------------------------------------------------------------------------------------------------- No results for input(s): DDIMER in the last 72  hours. -------------------------------------------------------------------------------------------------------------------  Cardiac Enzymes No results for input(s): CKMB, TROPONINI, MYOGLOBIN in the last 168 hours.  Invalid input(s): CK ------------------------------------------------------------------------------------------------------------------ Invalid input(s): POCBNP   ---------------------------------------------------------------------------------------------------------------  Urinalysis    Component Value Date/Time   COLORURINE YELLOW 08/12/2014 1840   APPEARANCEUR TURBID* 08/12/2014 1840   LABSPEC 1.018 08/12/2014 1840   PHURINE 7.0 08/12/2014 1840   GLUCOSEU NEGATIVE 08/12/2014 1840   HGBUR LARGE* 08/12/2014 1840   BILIRUBINUR SMALL* 08/12/2014 1840   BILIRUBINUR neg 06/28/2014 [place] 08/12/2014 1840   PROTEINUR 100* 08/12/2014 1840   PROTEINUR + 06/28/2014 1445   UROBILINOGEN 1.0 08/12/2014 1840   UROBILINOGEN 0.2 06/28/2014 1445   NITRITE NEGATIVE 08/12/2014 1840   NITRITE + 06/28/2014 1445   LEUKOCYTESUR LARGE* 08/12/2014 1840    ----------------------------------------------------------------------------------------------------------------  ABG  Arterial Blood Gas result:  PH 7.130, PO2 95.1, PCO2 59.6, bicarbonate 18.6   Imaging results:   Dg Chest 2 View  07/29/2014   CLINICAL DATA:  Hypotension, history of stroke, left arm weakness  EXAM: CHEST  2 VIEW  COMPARISON:  07/18/2014  FINDINGS: Cardiomediastinal silhouette is stable. No acute infiltrate or pleural effusion. No pulmonary edema. Stable old fracture deformity distal left clavicle.  IMPRESSION: No active cardiopulmonary disease.   Electronically Signed   By: Lahoma Crocker M.D.   On: 07/29/2014 17:54   Ct Head Wo Contrast  07/29/2014   CLINICAL DATA:  Hypertension.  Previous stroke.  EXAM: CT HEAD WITHOUT CONTRAST  TECHNIQUE: Contiguous axial images were obtained from the base  of the skull through the vertex without intravenous contrast.  COMPARISON:  04/27/2014.  Brain MR dated 02/04/2014.  FINDINGS: Previously noted Chiari 1 malformation. Otherwise, normal appearing cerebral hemispheres and posterior fossa structures. Normal size and position of the ventricles. No intracranial hemorrhage, mass lesion or CT evidence of acute infarction. Unremarkable bones and included paranasal sinuses.  IMPRESSION: No acute abnormality.  Previously noted Chiari 1 malformation.   Electronically Signed   By: Georg Ruddle.D.  On: 07/29/2014 20:57   Dg Chest Port 1 View  08/12/2014   CLINICAL DATA:  Fever R50.9 (ICD-10-CM). Altered mental status. Recent dental surgery.  EXAM: PORTABLE CHEST - 1 VIEW  COMPARISON:  07/29/2014  FINDINGS: New subtle densities at the left lung base. Heart size is normal. Trachea is midline. Negative for a pneumothorax.  IMPRESSION: Subtle densities at the left lung base. Findings most likely represent atelectasis.   Electronically Signed   By: Markus Daft M.D.   On: 08/12/2014 19:08      Assessment & Plan  1. Sepsis with hypothermia and severe acidosis 2. Urinary tract infection probably the cause of 1 3. Dehydration and renal failure 4.insulin-dependent diabetes mellitus 5. History of the extraction 2 weeks ago status post treatment with clindamycin IV  Plan  IV vancomycin and meropenem and Diflucan IV fluids Bear hugger Change Foley catheter Patient refused intubation initially but he is considering it now, will observe closely in stepdown and intubate if needed.he is breathing better now. Keep BiPAP till AM  DVT Prophylaxis Heparin   AM Labs Ordered, also please review Full Orders  Family Communication: Admission, patients condition and plan of care including tests being ordered have been discussed with the parents who indicate understanding and agree with the plan and Code Status.  Code Status full code now  Disposition Plan: home  Time  spent in minutes : 42 minutes  Condition critical Case discussed with the CCM who will be consulted if a decision was made to intubate patient. Patient seems to be stable at this time with normal blood pressure and pulse. Will observe closely and consult  PCCM in case of intubation  @SIGNATURE @

## 2014-08-13 NOTE — Progress Notes (Signed)
INITIAL NUTRITION ASSESSMENT  Pt meets criteria for SEVERE MALNUTRITION in the context of acute as evidenced by 8% weight loss x 3 weeks and intake of </= 50% of his estimated needs in >/= 5 days.  DOCUMENTATION CODES Per approved criteria  -Severe malnutrition in the context of chronic illness   INTERVENTION: Initiate Vital AF 1.2 @ 25 ml/hr via OG tube and increase by 10 ml every 4 hours to goal rate of 65 ml/hr.   Tube feeding regimen provides 1807 kcal (104% of needs), 117 grams of protein, and 1265 ml of H2O. Total free water: 1865 ml   NUTRITION DIAGNOSIS: Inadequate oral intake related to inability to eat as evidenced by NPO status  Goal: Pt to meet >/= 90% of their estimated nutrition needs   Monitor:  Respiratory status, TF initiation and tolerance   Reason for Assessment: Consult received to initiate and manage enteral nutrition support.  28 y.o. male  Admitting Dx: Sepsis  ASSESSMENT: Pt with hx of type 1 DM, gastroparesis, spinal cord infarct and paraplegia who was admitted with AMS. Antibiotics started for UTI and PNA. Pt failed BiPAP and was intubated.  Pt had multiple teeth extractions 2 weeks ago.  Pt with 2 wounds present, WOC following.  Pt awake and alert on the vent. Nods that he has had weight loss due to pain after teeth removal. Spoke with pt's mom via telephone and she describes pt's intake as Breakfast: applesauce, egg; Lunch: soup, Dinner: soup. Pt drank a few ensures but not consistently. Per mom pt was put on a soft diet after surgery 3 weeks ago and just would not eat after surgery.   Patient is currently intubated on ventilator support MV: 9.5 L/min Temp (24hrs), Avg:97.7 F (36.5 C), Min:94.6 F (34.8 C), Max:101.3 F (38.5 C)  Propofol: none  Physical exam completed however unable to use for malnutrition as pt with paraplegia. Per RN pt can move upper extremities but no fine motor movements.  Height: Ht Readings from Last 1 Encounters:   08/13/14 5\' 8"  (1.727 m)    Weight: Wt Readings from Last 1 Encounters:  08/13/14 129 lb 6.6 oz (58.7 kg)    Ideal Body Weight: 131-139 lb   % Ideal Body Weight: 93-98%  Wt Readings from Last 10 Encounters:  08/13/14 129 lb 6.6 oz (58.7 kg)  08/03/14 139 lb (63.05 kg)  07/30/14 139 lb 1 oz (63.078 kg)  07/19/14 135 lb 12.9 oz (61.6 kg)  05/28/14 138 lb 14.2 oz (63 kg)  05/20/14 141 lb (63.957 kg)  04/19/14 140 lb (63.504 kg)  04/02/14 140 lb (63.504 kg)  03/22/14 140 lb (63.504 kg)  03/21/14 140 lb 9.6 oz (63.776 kg)    Usual Body Weight: 140 lb   % Usual Body Weight: 92%  BMI:  Body mass index is 19.68 kg/(m^2).  Estimated Nutritional Needs: Kcal: 1807 Protein: 100-120 grams Fluid: > 1.8 L/day  Skin:  Stage II wound on sacrum Ustageable wound to right ischium Scar tissue with intact skin to left buttocks  Diet Order:    EDUCATION NEEDS: -No education needs identified at this time   Intake/Output Summary (Last 24 hours) at 08/13/14 1114 Last data filed at 08/13/14 1000  Gross per 24 hour  Intake   1245 ml  Output   1830 ml  Net   -585 ml    Last BM: 11/6   Labs:   Recent Labs Lab 08/12/14 1840 08/13/14 0235  NA 137 138  K 5.5*  4.3  CL 100 104  CO2 23 18*  BUN 41* 34*  CREATININE 2.40* 1.60*  CALCIUM 10.3 9.4  GLUCOSE 301* 271*    CBG (last 3)   Recent Labs  08/12/14 2236 08/13/14 0712  GLUCAP 261* 279*    Scheduled Meds: . albuterol  2.5 mg Nebulization Q6H  . antiseptic oral rinse  7 mL Mouth Rinse QID  . aztreonam  1 g Intravenous 3 times per day  . chlorhexidine  15 mL Mouth Rinse BID  . collagenase   Topical Daily  . feeding supplement (VITAL HIGH PROTEIN)  1,000 mL Per Tube Q24H  . fentaNYL  100 mcg Transdermal Q72H  . free water  200 mL Per Tube 3 times per day  . heparin  5,000 Units Subcutaneous 3 times per day  . insulin aspart  0-9 Units Subcutaneous 6 times per day  . insulin glargine  15 Units Subcutaneous  Daily  . lidocaine (cardiac) 100 mg/74ml      . rocuronium      . sodium chloride  3 mL Intravenous Q12H  . succinylcholine      . vancomycin  500 mg Intravenous Q12H    Continuous Infusions: . sodium chloride 10 mL/hr at 08/13/14 1112  . dexmedetomidine    . fentaNYL infusion INTRAVENOUS 100 mcg/hr (08/13/14 0847)    Past Medical History  Diagnosis Date  . GERD (gastroesophageal reflux disease)   . Asthma   . Hx MRSA infection     on face  . Gastroparesis   . Diabetic neuropathy   . Seizures   . Stroke     spinal stroke in 4/15  . Diabetes mellitus     sees Dr. Loanne Drilling   . Family history of anesthesia complication     Pt mother can't have epidural procedures  . Dysrhythmia   . Pneumonia   . Arthritis   . Fibromyalgia     Past Surgical History  Procedure Laterality Date  . Tonsillectomy    . Multiple extractions with alveoloplasty N/A 08/03/2014    Procedure: MULTIPLE EXTRACTIONS;  Surgeon: Gae Bon, DDS;  Location: [place];  Service: Oral Surgery;  Laterality: N/A;    Maylon Peppers RD, [place], [place] Pager [phone] After Hours Pager

## 2014-08-13 NOTE — Progress Notes (Signed)
Merrem to change to Azactam now that CCM has seen pt.  SCr starting to trend down.  Will start Azactam 1g IV  Q8H and monitor CBC, Cx, and SCr.  Vanc may need to be changed also.  Will watch closely.  Wynona Neat, PharmD, BCPS 08/13/2014 6:45 AM

## 2014-08-13 NOTE — Telephone Encounter (Signed)
I spoke with pt's mom [employer] and she said that pt went to [facility] emergency room yesterday afternoon. I did not get this message until this morning, since Thursday we leave the office early. Pt is in ICU now due to infection ( in bladder ). I did apologize to mom due to the fact that no one handled the call when it came in yesterday afternoon. The call a nurse did not advise pt to go to the ER, there recommendation was to go to the office now.

## 2014-08-13 NOTE — Progress Notes (Signed)
Blood culture report called and reported to RN. Gram + Cocci in clusters, Gram + cocci in chains. Results reported to Dr. Elsworth Soho. Appropriate abx in place per MD. Roxan Hockey, RN

## 2014-08-13 NOTE — Progress Notes (Signed)
UR Completed.  Vergie Living [phone] 08/13/2014

## 2014-08-13 NOTE — Progress Notes (Signed)
Pt put on Venti mask 50% per MD. Pt unable to tolerate it. Put back on BIPAP.

## 2014-08-13 NOTE — Progress Notes (Signed)
eLink Physician-Brief Progress Note Patient Name: Jason [name] DOB: 1986-06-04 MRN: [id number]   Date of Service  08/13/2014  HPI/Events of Note  Worsening respiratory acidosis on BIPAP Urosepsis  eICU Interventions  Move to ICU Resident to see Start making arrangements for intubation     Intervention Category Major Interventions: Hypercarbia - evaluation and management;Respiratory failure - evaluation and management  Maralyn Witherell 08/13/2014, 4:30 AM

## 2014-08-13 NOTE — Progress Notes (Signed)
ANTIBIOTIC CONSULT NOTE - INITIAL  Pharmacy Consult for Vancocin and Merrem Indication: rule out sepsis  Allergies  Allergen Reactions  . Cefuroxime Axetil Anaphylaxis  . Penicillins Anaphylaxis and Other (See Comments)    ?can take amoxicillin?  [name] [Benzonatate] Anaphylaxis  . Shellfish Allergy Itching and Other (See Comments)    Took benadryl to alleviate reaction    Patient Measurements: Height: 5\' 8"  (172.7 cm) Weight: 129 lb 6.6 oz (58.7 kg) IBW/kg (Calculated) : 68.4  Vital Signs: Temp: 94.6 F (34.8 C) (11/06 0145) Temp Source: Core (Comment) (11/06 0145) BP: 119/76 mmHg (11/06 0145) Pulse Rate: 85 (11/06 0145)  Labs:  Recent Labs  08/12/14 1840  WBC 23.8*  HGB 8.8*  PLT 425*  CREATININE 2.40*   Estimated Creatinine Clearance: 37.4 mL/min (by C-G formula based on Cr of 2.4).   Microbiology: Recent Results (from the past 720 hour(s))  Urine culture     Status: None   Collection Time: 07/16/14 12:19 PM  Result Value Ref Range Status   Colony Count >=100,000 COLONIES/ML  Final   Organism ID, Bacteria Yeast  Final  MRSA PCR Screening     Status: None   Collection Time: 07/18/14  8:10 PM  Result Value Ref Range Status   MRSA by PCR NEGATIVE NEGATIVE Final    Comment:        The GeneXpert MRSA Assay (FDA approved for NASAL specimens only), is one component of a comprehensive MRSA colonization surveillance program. It is not intended to diagnose MRSA infection nor to guide or monitor treatment for MRSA infections.  Culture, blood (routine x 2)     Status: None   Collection Time: 07/18/14  9:49 PM  Result Value Ref Range Status   Specimen Description BLOOD RIGHT ANTECUBITAL  Final   Special Requests   Final    BOTTLES DRAWN AEROBIC AND ANAEROBIC 10CC BLUE 5CC RED   Culture  Setup Time   Final    07/19/2014 02:38 Performed at [place]   Final    NO GROWTH 5 DAYS Performed at [facility]   Report Status  07/25/2014 FINAL  Final  Culture, blood (routine x 2)     Status: None   Collection Time: 07/18/14 10:04 PM  Result Value Ref Range Status   Specimen Description BLOOD RIGHT HAND  Final   Special Requests BOTTLES DRAWN AEROBIC ONLY 5CC  Final   Culture  Setup Time   Final    07/19/2014 02:38 Performed at [place]   Final    NO GROWTH 5 DAYS Performed at [facility]   Report Status 07/25/2014 FINAL  Final  Urine culture     Status: None   Collection Time: 07/18/14 10:29 PM  Result Value Ref Range Status   Specimen Description URINE, CATHETERIZED  Final   Special Requests NONE  Final   Culture  Setup Time   Final    07/18/2014 22:25 Performed at [place]   Final    55,000 COLONIES/ML Performed at [place] Performed at [facility]  Final   Report Status 07/20/2014 FINAL  Final  MRSA PCR Screening     Status: None   Collection Time: 07/30/14  6:35 AM  Result Value Ref Range Status   MRSA by PCR NEGATIVE NEGATIVE Final    Comment:        The GeneXpert MRSA  Assay (FDA approved for NASAL specimens only), is one component of a comprehensive MRSA colonization surveillance program. It is not intended to diagnose MRSA infection nor to guide or monitor treatment for MRSA infections.    Medical History: Past Medical History  Diagnosis Date  . GERD (gastroesophageal reflux disease)   . Asthma   . Hx MRSA infection     on face  . Gastroparesis   . Diabetic neuropathy   . Seizures   . Stroke     spinal stroke in 4/15  . Diabetes mellitus     sees Dr. Loanne Drilling   . Family history of anesthesia complication     Pt mother can't have epidural procedures  . Dysrhythmia   . Pneumonia   . Arthritis   . Fibromyalgia     Medications:  Prescriptions prior to admission  Medication Sig Dispense Refill Last Dose  . albuterol (PROVENTIL) (2.5 MG/3ML) 0.083% nebulizer solution Take 3 mLs  (2.5 mg total) by nebulization every 4 (four) hours as needed for wheezing or shortness of breath. 75 mL 3 More than a month at Unknown time  . amLODipine (NORVASC) 2.5 MG tablet Take 1 tablet (2.5 mg total) by mouth daily. 30 tablet 0 08/02/2014 at Unknown time  . baclofen (LIORESAL) 10 MG tablet Take 10-20 mg by mouth 3 (three) times daily. Takes 10mg  in morning and lunchtime and takes 20mg  at bedtime   08/03/2014 at Unknown time  . clindamycin (CLEOCIN) 300 MG capsule Take 1 capsule (300 mg total) by mouth 3 (three) times daily. 21 capsule 0   . dexlansoprazole (DEXILANT) 60 MG capsule Take 60 mg by mouth daily.   08/02/2014 at Unknown time  . diclofenac sodium (VOLTAREN) 1 % GEL Apply 2 g topically daily as needed (for pain).   Past Week at Unknown time  . diphenoxylate-atropine (LOMOTIL) 2.5-0.025 MG per tablet Take 1 tablet by mouth 2 (two) times daily. 60 tablet 0 Past Week at Unknown time  . fluconazole (DIFLUCAN) 200 MG tablet Take 1 tablet (200 mg total) by mouth daily. 14 tablet 0 10/23  . furosemide (LASIX) 40 MG tablet Take 0.5 tablets (20 mg total) by mouth daily as needed for fluid. 30 tablet 0 10/24  . glucagon (GLUCAGON EMERGENCY) 1 MG injection Inject 1 mg into the vein once as needed. 1 each 12 More than a month at Unknown time  . glucose blood (ACCU-CHEK AVIVA PLUS) test strip Use to check blood sugar 4 times per day 400 each 2   . insulin aspart (NOVOLOG) 100 UNIT/ML injection Inject 5 Units into the skin 3 (three) times daily with meals. 10 mL 2 08/03/2014 at Unknown time  . insulin glargine (LANTUS) 100 UNIT/ML injection Inject 0.22 mLs (22 Units total) into the skin at bedtime. 10 mL 11 08/02/2014 at Unknown time  . loratadine (CLARITIN) 10 MG tablet Take 10 mg by mouth 2 (two) times daily as needed for allergies.   08/02/2014 at Unknown time  . metoCLOPramide (REGLAN) 5 MG tablet Take 1 tablet (5 mg total) by mouth 2 (two) times a week. And as needed for nausea 8 tablet 3 Past  Week at Unknown time  . morphine (MS CONTIN) 30 MG 12 hr tablet Take 1 tablet (30 mg total) by mouth every 12 (twelve) hours. 60 tablet 0 08/02/2014 at Unknown time  . OxyCODONE (OXYCONTIN) 15 mg T12A 12 hr tablet Take 15 mg by mouth every 12 (twelve) hours.     Marland Kitchen oxyCODONE-acetaminophen (PERCOCET)  10-325 MG per tablet Take 1 tablet by mouth every 8 (eight) hours as needed for pain. 90 tablet 0 08/03/2014 at Unknown time  . pregabalin (LYRICA) 100 MG capsule Take 100-200 mg by mouth 4 (four) times daily. Take 100 mg by mouth in the morning, take 100 mg by mouth in the afternoon, take 100 mg by mouth in the evening and take 200 mg by mouth at bedtime.   08/03/2014 at Unknown time  . silver sulfADIAZINE (SILVADENE) 1 % cream Apply 1 application topically 2 (two) times a week. Tuesday and thursday   Past Week at Unknown time   Scheduled:  . sodium chloride   Intravenous STAT  . fluconazole (DIFLUCAN) IV  200 mg Intravenous Q24H  . heparin  5,000 Units Subcutaneous 3 times per day  . insulin aspart  0-5 Units Subcutaneous QHS  . insulin aspart  0-9 Units Subcutaneous TID WC  . sodium chloride  3 mL Intravenous Q12H   Infusions:  . sodium chloride      Assessment: 28yo male presents to [facility] w/ AMS, had started on ABX for UTI after completing course of clinda for a dental procedure, became more alert in ED after Narcan though continues to look septic, now admitted, to begin IV ABX.  Noted that SCr = 2.4, baseline ~0.8.  Goal of Therapy:  Vancomycin trough level 15-20 mcg/ml  Plan:  Rec'd vanc 1g and Primaxin 500mg  IV in ED; will continue with vancomycin 750mg  IV Q24H (was previously therapeutic at 500mg  IV Q8H, consider changing when renal function improves) and Merrem 1g IV Q12H and monitor CBC, SCr, Cx, levels prn.  Wynona Neat, PharmD, BCPS  08/13/2014,2:05 AM

## 2014-08-13 NOTE — Progress Notes (Signed)
eLink Physician-Brief Progress Note Patient Name: Jason [name] DOB: [birth year] MRN: [id number]   Date of Service  08/13/2014  HPI/Events of Note  Presumed septic shock  eICU Interventions  Ns bolus Start neo if MAP remains low Chk rpt lactate     Intervention Category Major Interventions: Sepsis - evaluation and management  ALVA,RAKESH V. 08/13/2014, 8:15 PM

## 2014-08-13 NOTE — Consult Note (Signed)
PULMONARY / CRITICAL CARE MEDICINE   Name: Jason [name] MRN: [id number] DOB: Mar 01, 1986    ADMISSION DATE:  08/12/2014 CONSULTATION DATE:  08/13/14  REFERRING MD :  Georgiann Cocker   CHIEF COMPLAINT:  Respiratory distress   INITIAL PRESENTATION: 28 yo M, hx spinal cord infarct and paraplegia, that presented with altered mental status and fever to [facility]. He was given narcan and showed some improvement of his cognition. He was transferred to [facility].  Thought to have PNA based on x-ray as well as a UTI. Antibiotics were started and placed in SDU.  Found to be acidotic and BiPAP was started. Had worsening acidosis with ABG (7.064/72.6/93.8) so he was intubated.   STUDIES:  11/5: CXR >>Densities at left lung base  SIGNIFICANT EVENTS: 11/6: failed trial of BiPAP 2/2 hypercarbia >> intubated    HISTORY OF PRESENT ILLNESS:  Patient was encephalopathic and intubated so history obtained from mother.   He recently had surgery to remove 7 teeth and completed a 7 day course of clindamycin. His mother noticed that he started developing blisters on his left hand.  His mother lanced them and they re-appeared.  They then appeared on his right hand and his trunk. She thought it was an allergic reaction to the antibiotics or new anti-hypertensives. She called EMS and he was seen at [facility].  He started having fevers as of yesterday morning. Highest reported of 101.7. He's had a productive cough and had sick contacts.  PAST MEDICAL HISTORY :   has a past medical history of GERD (gastroesophageal reflux disease); Asthma; MRSA infection; Gastroparesis; Diabetic neuropathy; Seizures; Stroke; Diabetes mellitus; Family history of anesthesia complication; Dysrhythmia; Pneumonia; Arthritis; and Fibromyalgia.  has past surgical history that includes Tonsillectomy and Multiple extractions with alveoloplasty (N/A, 08/03/2014). Prior to Admission medications   Medication Sig Start Date End Date  Taking? Authorizing Provider  albuterol (PROVENTIL) (2.5 MG/3ML) 0.083% nebulizer solution Take 3 mLs (2.5 mg total) by nebulization every 4 (four) hours as needed for wheezing or shortness of breath. 04/27/14   [name], MD  amLODipine (NORVASC) 2.5 MG tablet Take 1 tablet (2.5 mg total) by mouth daily. 07/30/14   Shanker Kristeen Mans, MD  baclofen (LIORESAL) 10 MG tablet Take 10-20 mg by mouth 3 (three) times daily. Takes 10mg  in morning and lunchtime and takes 20mg  at bedtime    Historical Provider, MD  clindamycin (CLEOCIN) 300 MG capsule Take 1 capsule (300 mg total) by mouth 3 (three) times daily. 08/03/14   Gae Bon, DDS  dexlansoprazole (DEXILANT) 60 MG capsule Take 60 mg by mouth daily.    Historical Provider, MD  diclofenac sodium (VOLTAREN) 1 % GEL Apply 2 g topically daily as needed (for pain).    Historical Provider, MD  diphenoxylate-atropine (LOMOTIL) 2.5-0.025 MG per tablet Take 1 tablet by mouth 2 (two) times daily. 07/21/14   Willia Craze, NP  fluconazole (DIFLUCAN) 200 MG tablet Take 1 tablet (200 mg total) by mouth daily. 07/21/14   Allie Bossier, MD  furosemide (LASIX) 40 MG tablet Take 0.5 tablets (20 mg total) by mouth daily as needed for fluid. 07/30/14   Shanker Kristeen Mans, MD  glucagon (GLUCAGON EMERGENCY) 1 MG injection Inject 1 mg into the vein once as needed. 04/20/14   [name], MD  glucose blood (ACCU-CHEK AVIVA PLUS) test strip Use to check blood sugar 4 times per day 07/21/14   Renato Shin, MD  insulin aspart (NOVOLOG) 100  UNIT/ML injection Inject 5 Units into the skin 3 (three) times daily with meals. 07/21/14   Renato Shin, MD  insulin glargine (LANTUS) 100 UNIT/ML injection Inject 0.22 mLs (22 Units total) into the skin at bedtime. 07/21/14   Renato Shin, MD  loratadine (CLARITIN) 10 MG tablet Take 10 mg by mouth 2 (two) times daily as needed for allergies.    Historical Provider, MD  metoCLOPramide (REGLAN) 5 MG tablet Take 1 tablet (5 mg total)  by mouth 2 (two) times a week. And as needed for nausea 07/01/14   Meredith Staggers, MD  morphine (MS CONTIN) 30 MG 12 hr tablet Take 1 tablet (30 mg total) by mouth every 12 (twelve) hours. 07/16/14   Meredith Staggers, MD  OxyCODONE (OXYCONTIN) 15 mg T12A 12 hr tablet Take 15 mg by mouth every 12 (twelve) hours.    Historical Provider, MD  oxyCODONE-acetaminophen (PERCOCET) 10-325 MG per tablet Take 1 tablet by mouth every 8 (eight) hours as needed for pain. 07/16/14   Meredith Staggers, MD  pregabalin (LYRICA) 100 MG capsule Take 100-200 mg by mouth 4 (four) times daily. Take 100 mg by mouth in the morning, take 100 mg by mouth in the afternoon, take 100 mg by mouth in the evening and take 200 mg by mouth at bedtime.    Historical Provider, MD  silver sulfADIAZINE (SILVADENE) 1 % cream Apply 1 application topically 2 (two) times a week. Tuesday and thursday    Historical Provider, MD   Allergies  Allergen Reactions  . Cefuroxime Axetil Anaphylaxis  . Penicillins Anaphylaxis and Other (See Comments)    ?can take amoxicillin?  [name] [Benzonatate] Anaphylaxis  . Shellfish Allergy Itching and Other (See Comments)    Took benadryl to alleviate reaction    FAMILY HISTORY:  indicated that his mother is alive. He indicated that his father is alive.  SOCIAL HISTORY:  reports that he has been smoking Cigars and Cigarettes.  He has been smoking about 0.00 packs per day. He has never used smokeless tobacco. He reports that he does not drink alcohol or use illicit drugs.  REVIEW OF SYSTEMS:  Unable to complete as patient is sedated   SUBJECTIVE:   VITAL SIGNS: Temp:  [94.6 F (34.8 C)-101.3 F (38.5 C)] 95.9 F (35.5 C) (11/06 0330) Pulse Rate:  [83-102] 86 (11/06 0334) Resp:  [15-27] 22 (11/06 0334) BP: (92-123)/(49-79) 96/49 mmHg (11/06 0330) SpO2:  [84 %-100 %] 96 % (11/06 0334) FiO2 (%):  [50 %-80 %] 80 % (11/06 0334) Weight:  [125 lb (56.7 kg)-129 lb 6.6 oz (58.7 kg)] 129 lb 6.6 oz  (58.7 kg) (11/06 0045) HEMODYNAMICS:   VENTILATOR SETTINGS: Vent Mode:  [-] BIPAP FiO2 (%):  [50 %-80 %] 80 % Set Rate:  [12 bmp] 12 bmp PEEP:  [6 cmH20-8 cmH20] 8 cmH20 INTAKE / OUTPUT:  Intake/Output Summary (Last 24 hours) at 08/13/14 0443 Last data filed at 08/13/14 0200  Gross per 24 hour  Intake      0 ml  Output   1450 ml  Net  -1450 ml    PHYSICAL EXAMINATION: General:  Intubated, ill appearing  Neuro:  Spontaneous eye opening and response to painful stimuli  HEENT:  Poor dentition  Cardiovascular:  S1S2, RRR  Lungs:  Rhonchorous, no wheezes, extra effort to breath   Abdomen:  Mildly distended, +BS  Musculoskeletal:  Functionally quadriplegic but can move shoulders.  Skin:  Sacral decubitus ulcer   LABS:  CBC  Recent Labs Lab 08/12/14 1840 08/13/14 0235  WBC 23.8* 30.0*  HGB 8.8* 8.5*  HCT 27.8* 26.8*  PLT 425* 420*   Coag's No results for input(s): APTT, INR in the last 168 hours. BMET  Recent Labs Lab 08/12/14 1840 08/13/14 0235  NA 137 138  K 5.5* 4.3  CL 100 104  CO2 23 18*  BUN 41* 34*  CREATININE 2.40* 1.60*  GLUCOSE 301* 271*   Electrolytes  Recent Labs Lab 08/12/14 1840 08/13/14 0235  CALCIUM 10.3 9.4   Sepsis Markers  Recent Labs Lab 08/12/14 1840  LATICACIDVEN 0.63   ABG  Recent Labs Lab 08/13/14 0130 08/13/14 0500  PHART 7.151* 7.064*  PCO2ART 55.4* 72.6*  PO2ART 86.8 93.8   Liver Enzymes  Recent Labs Lab 08/12/14 1840  AST 10  ALT 7  ALKPHOS 169*  BILITOT 0.3  ALBUMIN 2.7*   Cardiac Enzymes No results for input(s): TROPONINI, PROBNP in the last 168 hours. Glucose  Recent Labs Lab 08/12/14 2236  GLUCAP 261*    Imaging Dg Chest Port 1 View  08/12/2014   CLINICAL DATA:  Fever R50.9 (ICD-10-CM). Altered mental status. Recent dental surgery.  EXAM: PORTABLE CHEST - 1 VIEW  COMPARISON:  07/29/2014  FINDINGS: New subtle densities at the left lung base. Heart size is normal. Trachea is midline.  Negative for a pneumothorax.  IMPRESSION: Subtle densities at the left lung base. Findings most likely represent atelectasis.   Electronically Signed   By: Markus Daft M.D.   On: 08/12/2014 19:08     ASSESSMENT / PLAN:  PULMONARY OETT 11/6 A:Acute respiratory failure  Acute worsening Hypercarbia Evolving LLL infiltrate suggesting PNA >>?HCAP recent admission (Oct) Asthma   P:   Full mechanical ventilation  Trend ABG, CXR   CARDIOVASCULAR  A:  Hx of HTN, Autonomic dysfunction (hypo/hypertension)   P:  Holding amlodipine   RENAL A:  Acute renal failure    P:   NS 150 mL/hr  Follow BMP and UOP  GASTROINTESTINAL A:  Transaminitis w/ hx of elevated liver enzymes  - hepatitis panel neg on prior admission   - previously held lipitor    GERD  Hx of Gastroparesis  P:   NPO  Consider starting tube feeds if to remain intubated  Holding reglan   HEMATOLOGIC A:  VTE Ppx  P:  Heparin subQ   INFECTIOUS A:  Urosepsis  Sacral decubitus ulcer (unstageable)  Leukocytosis  Suspected LLL HCAP P:   BCx2 11/5 UC  11/5 Sputum 11/6 Trend lactate Consider c/s to wound care  Abx: Primaxin 11/5 x 1  Abx: levaquin, Start date 11/6 Abx: vanc, start 11/5  Abx: aztreonam, start 11/6 Diflucan  > ? Duration of this, ? 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 Started on broad spectrum antibiotics. BiPAP was started due to sever acidosis. Trended worsening  acidosis by  ABG so was intubated.    [name], MD PGY-2, [facility] 08/13/2014, 4:43 AM  Attending Note:  I have examined patient, reviewed all labs, studies, notes. I have discussed the case with Dr Raeford Razor and agree with the note as amended above. Pt is chronically debilitated. On my initial exam he was unresponsive despite BiPAP, hemodynamically stable. He was intubated without incident for hypercapnia and obtundation. He is currently stable on MV, sedated / obtunded. Moves UE's to stim. UA consistent with UTI and his CXR post-intubation shows a LLL infiltrate. I will treat him for UTI and possible HCAP, change his abx to vanco + levaquin + aztreonam. Plan for WUA in am 11/6. Independent CC time 60 minutes.   Baltazar Apo, MD, PhD 08/13/2014, 6:51 AM [facility] [phone] or if no answer [phone]

## 2014-08-13 NOTE — ED Notes (Signed)
ER stay from 1945:  Pt is awake and oriented,is holding suction,complaining of pain all over.  O2 sats 100% .  Mom at bedside. IV rt AC occlulded>removed and another IV started  #20 left forearm.  B/P fluctuating.Resp therapy in due to sats dropping and the patient becoming more lethargic. Pt was responsive to voice but getting less responsive.  Mouth care given. Pt breathing more labored,retracting,MD notified.Bi pap ordered.  Another narcan given. Pt became a little more alert.  Attempted to retake rectal temp but pt continually having stool.  Cleaned almost constantly.  Care Link here.

## 2014-08-13 NOTE — Procedures (Signed)
Intubation Procedure Note Jason [name] [id number] [birth year]  Procedure: Intubation Indications: Respiratory insufficiency  Procedure Details Consent: Risks of procedure as well as the alternatives and risks of each were explained to the (patient/caregiver).  Consent for procedure obtained. Time Out: Verified patient identification, verified procedure, site/side was marked, verified correct patient position, special equipment/implants available, medications/allergies/relevent history reviewed, required imaging and test results available.  Performed  Maximum sterile technique was used including antiseptics, gloves and hand hygiene. Mac 4 Fentanyl 100 mcg Versed 2 mg  Etomidate 20 mg    Evaluation Hemodynamic Status: BP stable throughout; O2 sats: stable throughout Patient's Current Condition: stable Complications: Complications of urosepsis and PNA  Patient did tolerate procedure well. Chest X-ray ordered to verify placement.  CXR: pending.   Jason [name], PGY-2  08/13/2014  Attending Note:  I was present and participated in the entire procedure with Dr Venita Lick, MD, PhD 08/13/2014, 5:49 AM [facility] [phone] or if no answer [phone]

## 2014-08-13 NOTE — Progress Notes (Signed)
Inpatient Diabetes Program Recommendations  AACE/ADA: New Consensus Statement on Inpatient Glycemic Control (2013)  Target Ranges:  Prepandial:   less than 140 mg/dL      Peak postprandial:   less than 180 mg/dL (1-2 hours)      Critically ill patients:  140 - 180 mg/dL   Reason for Assessment:  Results for Jason [name], Jason [name] (MRN [id number]) as of 08/13/2014 12:07  Ref. Range 08/12/2014 22:36 08/13/2014 07:12  Glucose-Capillary Latest Range: 70-99 mg/dL 261 (H) 279 (H)   Diabetes history: Type 1 diabetes/ Outpatient Diabetes medications: Lantus 22 units daily, Novolog 5 units tid with meals Current orders for Inpatient glycemic control:   Lantus 15 units daily,  Novolog sensitive q 4 hours.  Agree with the addition of Lantus.  Note that Tube feeds will be started.  Consider adding Novolog 2 units q 4 hours to cover CHO in tube feeds.   Thanks, Adah Perl, RN, BC-ADM Inpatient Diabetes Coordinator Pager [phone]

## 2014-08-13 NOTE — Progress Notes (Signed)
PULMONARY / CRITICAL CARE MEDICINE   Name:Jason [name] HBZ:[id number] DOB:May 22, 1986   ADMISSION DATE: 08/12/2014 CONSULTATION DATE: 08/13/14  REFERRING MD : Georgiann Cocker   CHIEF COMPLAINT: Respiratory distress   INITIAL PRESENTATION: 28 yo M, hx spinal cord infarct and paraplegia presented with AMS, fever to [facility] and transferred to [facility]. Thought to have PNA based on x-ray as well as a UTI. Antibiotics were started and placed in SDU. Found to be acidotic and BiPAP was started. Had worsening resp acidosis requiring intubation.   STUDIES/SIGNIFICANT EVENTS: 11/06 Admitted by [facility] to SDU. Failed trial of BiPAP. Transferred to ICU and intubated. PCCM assumed primary duties 11/06 [place] consult for unstageable sacral pressure ulcer  INDWELLING DEVICES:: ETT 11/06 >>   MICRO DATA: Urine 11/05 >>  Resp 11/06 >>  Blood 11/05 >>    ANTIMICROBIALS:  Imipenem 11/05 >> 11/06 Levofloxacin 11/06 >> 11/06 Vanc 11/05 >>  Aztreonam 11/06 >>   SUBJ RASS 0. + F/C. CAM-ICU negative  Filed Vitals:   08/13/14 1100 08/13/14 1200 08/13/14 1205 08/13/14 1300  BP: 106/59  100/59 105/74  Pulse: 101 99 108 121  Temp: 100 F (37.8 C) 100.8 F (38.2 C) 100.8 F (38.2 C)   TempSrc:      Resp: 20 37 20 28  Height:      Weight:      SpO2: 96% 89% 91% 98%   Vent Mode:  [-] PRVC FiO2 (%):  [50 %-80 %] 50 % Set Rate:  [12 bmp-20 bmp] 20 bmp Vt Set:  [400 mL-550 mL] 400 mL PEEP:  [5 cmH20-8 cmH20] 5 cmH20 Plateau Pressure:  [30 cmH20] 30 cmH20   I/O last 3 completed shifts: In: 707.5 [I.V.:707.5] Out: 1450 [Urine:1450] Total I/O In: 647.7 [I.V.:397.7; IV Piggyback:250] Out: 595 [Urine:595]  Gen: RASS 0, CAM ICU neg Neuro: paraplegic, EOMI, PERRL HEENT: poor dentition, otherwise WNL Chest: severe RLL rhonchi, minimal LLL rhonchi, prolonged exp time, no wheezes Cardiac: Reg, no M Abdomen: soft, NT, +BS Ext: Bulla on dorsal surface of R  Skin:  Sacral decub ulcer  I have reviewed all of today's lab results. Relevant abnormalities are discussed in the A/P section  ASSESSMENT / PLAN: PULMONARY A:Acute hypercarbic and hypoxemic respiratory failure  LLL PNA Poor cough mechanics Asthmawith evidence of airflow obstruction P:  Cont full vent support - settings reviewed and/or adjusted Cont vent bundle Daily SBT if/when meets criteria Change nebulized BDs to scheduled Add nebulized steroids 11/06  CARDIOVASCULAR  A:  Hx of HTN, Autonomic dysfunction P:  Holding amlodipine Monitor BP and rhythm   RENAL A: Acute renal failure, nonoliguric - improving  P:  Monitor BMET intermittently Monitor I/Os Correct electrolytes as indicated DC IVFs  GASTROINTESTINAL A:  Elevated liver enzymes GERD  Hx of Gastroparesis  P:  SUP: enteral pantoprazole Begin TFs 11/06  HEMATOLOGIC A:   P:  Heparin subQ   INFECTIOUS A:  UTI Sacral decubitus ulcer (unstageable)  LLL HCAP P:  Micro and abx as above  ENDOCRINE A:  Type 1 DM  P:  Begin Lantus @ reduced dose (hoome dose is 22 units qHS)  Change SSI to q 4 hr, sens scale  NEUROLOGIC A: Acute encephalopathy, resolved 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  After 5:30 PM or weekends, call [phone]

## 2014-08-13 NOTE — Consult Note (Addendum)
WOC wound consult note Reason for Consult: Consult requested for right ischium, sacrum, and buttocks. Pt is frequently incontinent and it is difficult to keep wounds from becoming soiled. Wound type: Right ischium unstageable wound 4X3cm, 100% tightly adhered soft eschar, no odor or drainage.   Sacrum stage 2 wound, 2X3X.1cm, 100% pink and moist, no odor or drainage. Left buttocks with scar tissue and intact skin. Pressure Ulcer POA: Yes Dressing procedure/placement/frequency: Leave foam dressing off it sacrum and buttocks since pt is frequently incontinent.  Apply barrier cream PRN when turning or cleaning to protect skin. Santyl to right ischium for chemical debridement of nonviable tissue. Pt is on a Sport low-airloss bed to reduce pressure. Please re-consult if further assistance is needed.  Thank-you,  Julien Girt MSN, [place], [place], [place], [place]

## 2014-08-14 ENCOUNTER — Inpatient Hospital Stay (HOSPITAL_COMMUNITY): Payer: Medicaid Other

## 2014-08-14 DIAGNOSIS — R7881 Bacteremia: Secondary | ICD-10-CM

## 2014-08-14 DIAGNOSIS — L89159 Pressure ulcer of sacral region, unspecified stage: Secondary | ICD-10-CM

## 2014-08-14 DIAGNOSIS — E1041 Type 1 diabetes mellitus with diabetic mononeuropathy: Secondary | ICD-10-CM

## 2014-08-14 DIAGNOSIS — L89150 Pressure ulcer of sacral region, unstageable: Secondary | ICD-10-CM

## 2014-08-14 LAB — COMPREHENSIVE METABOLIC PANEL
ALBUMIN: 2 g/dL — AB (ref 3.5–5.2)
ALT: 7 U/L (ref 0–53)
ANION GAP: 16 — AB (ref 5–15)
AST: 10 U/L (ref 0–37)
Alkaline Phosphatase: 153 U/L — ABNORMAL HIGH (ref 39–117)
BILIRUBIN TOTAL: 0.2 mg/dL — AB (ref 0.3–1.2)
BUN: 29 mg/dL — ABNORMAL HIGH (ref 6–23)
CHLORIDE: 106 meq/L (ref 96–112)
CO2: 17 mEq/L — ABNORMAL LOW (ref 19–32)
Calcium: 9.5 mg/dL (ref 8.4–10.5)
Creatinine, Ser: 1.22 mg/dL (ref 0.50–1.35)
GFR calc Af Amer: >90 mL/min (ref >90)
GFR calc non Af Amer: 78 mL/min — ABNORMAL LOW (ref >90)
Glucose, Bld: 264 mg/dL — ABNORMAL HIGH (ref 70–99)
Potassium: 3.5 mEq/L — ABNORMAL LOW (ref 3.7–5.3)
Sodium: 139 mEq/L (ref 137–147)
Total Protein: 6.6 g/dL (ref 6.0–8.3)

## 2014-08-14 LAB — GLUCOSE, CAPILLARY
Glucose-Capillary: 254 mg/dL — ABNORMAL HIGH (ref 70–99)
Glucose-Capillary: 245 mg/dL — ABNORMAL HIGH (ref 70–99)
Glucose-Capillary: 257 mg/dL — ABNORMAL HIGH (ref 70–99)
Glucose-Capillary: 165 mg/dL — ABNORMAL HIGH (ref 70–99)
Glucose-Capillary: 121 mg/dL — ABNORMAL HIGH (ref 70–99)

## 2014-08-14 LAB — CBC
HEMATOCRIT: 23 % — AB (ref 39.0–52.0)
Hemoglobin: 7.4 g/dL — ABNORMAL LOW (ref 13.0–17.0)
MCH: 25.3 pg — ABNORMAL LOW (ref 26.0–34.0)
MCHC: 32.2 g/dL (ref 30.0–36.0)
MCV: 78.5 fL (ref 78.0–100.0)
PLATELETS: 416 10*3/uL — AB (ref 150–400)
RBC: 2.93 MIL/uL — ABNORMAL LOW (ref 4.22–5.81)
RDW: 16 % — ABNORMAL HIGH (ref 11.5–15.5)
WBC: 29.1 10*3/uL — ABNORMAL HIGH (ref 4.0–10.5)

## 2014-08-14 MED ORDER — ENOXAPARIN SODIUM 40 MG/0.4ML ~~LOC~~ SOLN
40.0000 mg | SUBCUTANEOUS | Status: DC
  Administered 2014-08-14 – 2014-08-15 (×2): 40 mg via SUBCUTANEOUS
  Filled 2014-08-14 (×4): qty 0.4

## 2014-08-14 MED ORDER — ACETAMINOPHEN 325 MG PO TABS
650.0000 mg | ORAL_TABLET | ORAL | Status: DC | PRN
  Administered 2014-08-15: 650 mg via ORAL
  Filled 2014-08-14: qty 2

## 2014-08-14 MED ORDER — KCL IN DEXTROSE-NACL 40-5-0.45 MEQ/L-%-% IV SOLN
INTRAVENOUS | Status: DC
  Administered 2014-08-14: 1000 mL via INTRAVENOUS
  Administered 2014-08-15: 05:00:00 via INTRAVENOUS
  Filled 2014-08-14 (×2): qty 1000

## 2014-08-14 MED ORDER — ACETAMINOPHEN 650 MG RE SUPP
650.0000 mg | RECTAL | Status: DC | PRN
  Administered 2014-08-19 – 2014-08-20 (×2): 650 mg via RECTAL
  Filled 2014-08-14 (×4): qty 1

## 2014-08-14 MED ORDER — PANTOPRAZOLE SODIUM 40 MG PO TBEC
40.0000 mg | DELAYED_RELEASE_TABLET | Freq: Every day | ORAL | Status: DC
  Administered 2014-08-15 – 2014-08-26 (×12): 40 mg via ORAL
  Filled 2014-08-14 (×13): qty 1

## 2014-08-14 MED ORDER — HYDROMORPHONE HCL 1 MG/ML IJ SOLN
0.5000 mg | INTRAMUSCULAR | Status: DC | PRN
  Administered 2014-08-14: 1 mg via INTRAVENOUS
  Administered 2014-08-14 (×3): 0.5 mg via INTRAVENOUS
  Administered 2014-08-14 – 2014-08-15 (×2): 1 mg via INTRAVENOUS
  Filled 2014-08-14 (×6): qty 1

## 2014-08-14 MED ORDER — INSULIN ASPART 100 UNIT/ML ~~LOC~~ SOLN
0.0000 [IU] | Freq: Three times a day (TID) | SUBCUTANEOUS | Status: DC
  Administered 2014-08-14: 3 [IU] via SUBCUTANEOUS
  Administered 2014-08-15: 2 [IU] via SUBCUTANEOUS
  Administered 2014-08-16 (×2): 3 [IU] via SUBCUTANEOUS
  Administered 2014-08-17: 5 [IU] via SUBCUTANEOUS
  Administered 2014-08-17: 3 [IU] via SUBCUTANEOUS
  Administered 2014-08-17 – 2014-08-18 (×2): 2 [IU] via SUBCUTANEOUS
  Administered 2014-08-18 – 2014-08-19 (×3): 5 [IU] via SUBCUTANEOUS
  Administered 2014-08-19: 3 [IU] via SUBCUTANEOUS
  Administered 2014-08-19: 8 [IU] via SUBCUTANEOUS
  Administered 2014-08-20: 2 [IU] via SUBCUTANEOUS
  Administered 2014-08-20: 8 [IU] via SUBCUTANEOUS
  Administered 2014-08-21: 3 [IU] via SUBCUTANEOUS
  Administered 2014-08-21: 8 [IU] via SUBCUTANEOUS
  Administered 2014-08-21: 11 [IU] via SUBCUTANEOUS

## 2014-08-14 MED ORDER — BUDESONIDE 0.25 MG/2ML IN SUSP
0.2500 mg | Freq: Four times a day (QID) | RESPIRATORY_TRACT | Status: DC
  Administered 2014-08-14 – 2014-08-26 (×47): 0.25 mg via RESPIRATORY_TRACT
  Filled 2014-08-14 (×54): qty 2

## 2014-08-14 MED ORDER — FENTANYL CITRATE 0.05 MG/ML IJ SOLN: 25.0000 ug | INTRAMUSCULAR | Status: DC | PRN

## 2014-08-14 MED ORDER — INSULIN ASPART 100 UNIT/ML ~~LOC~~ SOLN: 0.0000 [IU] | Freq: Three times a day (TID) | SUBCUTANEOUS | Status: DC

## 2014-08-14 NOTE — Consult Note (Signed)
[place] for Infectious Disease    Date of Admission:  08/12/2014   Total days of antibiotics 2               Reason for Consult: automatic consultation for staph aureus bacteremia     Principal Problem:   Staphylococcus aureus bacteremia Active Problems:   Type 1 diabetes, uncontrolled, with neuropathy   Asthma   GERD   Spinal cord infarction (history of)   Acute encephalopathy   Pneumonia   Sepsis   Acute respiratory failure with hypercapnia   UTI (lower urinary tract infection)   Healthcare-associated pneumonia   Renal failure (ARF), acute on chronic   Sacral pressure sore   . albuterol  2.5 mg Nebulization Q6H  . antiseptic oral rinse  7 mL Mouth Rinse QID  . aztreonam  1 g Intravenous 3 times per day  . budesonide  0.25 mg Nebulization Q6H  . chlorhexidine  15 mL Mouth Rinse BID  . collagenase   Topical Daily  . enoxaparin (LOVENOX) injection  40 mg Subcutaneous Q24H  . fentaNYL  100 mcg Transdermal Q72H  . insulin aspart  0-9 Units Subcutaneous 6 times per day  . insulin glargine  15 Units Subcutaneous Daily  . [START ON 08/15/2014] pantoprazole  40 mg Oral Q1200  . sodium chloride  3 mL Intravenous Q12H  . vancomycin  500 mg Intravenous Q12H    Recommendations: 1. Continue vancomycin 2. Discontinue aztreonam 3. Repeat blood cultures 4. Transthoracic echocardiogram   Assessment: He has staph aureus bacteremia. The source may be bibasilar healthcare associated pneumonia or a skin source. The blisters on his left hand do not appear to be infected. His urine culture is growing gram-negative rods but I suspect that this is asymptomatic colonization and will stop his aztreonam. Normally I would like to cover him with vancomycin and cefazolin pending susceptibility testing of his staph aureus but he has a history of anaphylaxis with beta-lactam antibiotics while we'll continue vancomycin alone.    HPI: Jason [name] is a 28 y.o. male with a  history of diabetes and paraplegia due to previous spinal cord infarction who was admitted 2 days ago with sudden onset of fever and confusion. Both admission blood cultures are growing staph aureus. He has had multiple recent admissions. He's also had recent dental extractions. He says he has a history of boils. He recently developed some blisters on his left hand. He states that his mother lanced one of them recently.   Review of Systems: Review of systems not obtained due to patient factors.  Past Medical History  Diagnosis Date  . GERD (gastroesophageal reflux disease)   . Asthma   . Hx MRSA infection     on face  . Gastroparesis   . Diabetic neuropathy   . Seizures   . Stroke     spinal stroke in 4/15  . Diabetes mellitus     sees Dr. Loanne Drilling   . Family history of anesthesia complication     Pt mother can't have epidural procedures  . Dysrhythmia   . Pneumonia   . Arthritis   . Fibromyalgia     History  Substance Use Topics  . Smoking status: Current Some Day Smoker    Types: Cigars, Cigarettes    Last Attempt to Quit: 01/29/2014  . Smokeless tobacco: Never Used     Comment: e-cigarette and cigars currently  . Alcohol Use: No  Comment: rarely    Family History  Problem Relation Age of Onset  . Diabetes Father   . Hypertension Father   . Asthma      fhx  . Hypertension      fhx  . Stroke      fhx  . Heart disease Mother    Allergies  Allergen Reactions  . Cefuroxime Axetil Anaphylaxis  . Penicillins Anaphylaxis and Other (See Comments)    ?can take amoxicillin?  [name] [Benzonatate] Anaphylaxis  . Shellfish Allergy Itching and Other (See Comments)    Took benadryl to alleviate reaction    OBJECTIVE: Blood pressure 113/70, pulse 109, temperature 99 F (37.2 C), temperature source Oral, resp. rate 21, height 5\' 8"  (1.727 m), weight 129 lb 6.6 oz (58.7 kg), SpO2 100 %. General: he is alert, talkative and in no distress Skin: he has blisters over  his PIP joints on his left thumb and index finger. He has dry skin on his palms. There is a dry circular area on his right forearm where he said he had a recent boil. He has scars on his shins. He has a sacral and right ischial pressure sore Oral: Some missing teeth Lungs: coarse bibasilar breath sounds Cor: regular S1 and S2 with no murmur Abdomen: soft and nontender  Lab Results Lab Results  Component Value Date   WBC 29.1* 08/14/2014   HGB 7.4* 08/14/2014   HCT 23.0* 08/14/2014   MCV 78.5 08/14/2014   PLT 416* 08/14/2014    Lab Results  Component Value Date   CREATININE 1.22 08/14/2014   BUN 29* 08/14/2014   NA 139 08/14/2014   K 3.5* 08/14/2014   CL 106 08/14/2014   CO2 17* 08/14/2014    Lab Results  Component Value Date   ALT 7 08/14/2014   AST 10 08/14/2014   ALKPHOS 153* 08/14/2014   BILITOT 0.2* 08/14/2014     Microbiology: Recent Results (from the past 240 hour(s))  Blood Culture (routine x 2)     Status: None (Preliminary result)   Collection Time: 08/12/14  6:40 PM  Result Value Ref Range Status   Specimen Description BLOOD RIGHT [facility]  Final   Special Requests BOTTLES DRAWN AEROBIC AND ANAEROBIC 5CC EACH  Final   Culture  Setup Time   Final    08/13/2014 01:00 Performed at [facility]    Culture   Final    STAPHYLOCOCCUS AUREUS Note: Gram Stain Report Called to,Read Back By and Verified With: [facility] WORK RN [id number] Performed at [facility]    Report Status PENDING  Incomplete  Urine culture     Status: None (Preliminary result)   Collection Time: 08/12/14  6:40 PM  Result Value Ref Range Status   Specimen Description URINE, CATHETERIZED  Final   Special Requests NONE  Final   Culture  Setup Time   Final    08/13/2014 00:43 Performed at [place]   Final    >=100,000 COLONIES/ML Performed at [facility]    Culture   Final    [place] Performed at [facility]    Report  Status PENDING  Incomplete  Blood Culture (routine x 2)     Status: None (Preliminary result)   Collection Time: 08/12/14  6:48 PM  Result Value Ref Range Status   Specimen Description BLOOD RIGHT WRIST  Final   Special Requests BOTTLES DRAWN AEROBIC AND ANAEROBIC [place]  Final   Culture  Setup Time   Final    08/13/2014 00:59 Performed at [facility]    Culture   Final    STAPHYLOCOCCUS AUREUS Note: RIFAMPIN AND GENTAMICIN SHOULD NOT BE USED AS SINGLE DRUGS FOR TREATMENT OF STAPH INFECTIONS. Note: Gram Stain Report Called to,Read Back By and Verified With: Paula Compton RN 44P Performed at [facility]    Report Status PENDING  Incomplete  MRSA PCR Screening     Status: None   Collection Time: 08/13/14 12:43 AM  Result Value Ref Range Status   MRSA by PCR NEGATIVE NEGATIVE Final    Comment:        The GeneXpert MRSA Assay (FDA approved for NASAL specimens only), is one component of a comprehensive MRSA colonization surveillance program. It is not intended to diagnose MRSA infection nor to guide or monitor treatment for MRSA infections.   Culture, respiratory (NON-Expectorated)     Status: None (Preliminary result)   Collection Time: 08/13/14 11:52 AM  Result Value Ref Range Status   Specimen Description TRACHEAL ASPIRATE  Final   Special Requests NONE  Final   Gram Stain   Final    FEW WBC PRESENT,BOTH PMN AND MONONUCLEAR RARE SQUAMOUS EPITHELIAL CELLS PRESENT FEW GRAM POSITIVE COCCI IN PAIRS IN CLUSTERS RARE GRAM NEGATIVE RODS Performed at [facility]    Culture   Final    Culture reincubated for better growth Performed at [facility]    Report Status PENDING  Incomplete    Michel Bickers, [place] for [facility] [phone] pager   [phone] cell 08/14/2014, 12:47 PM

## 2014-08-14 NOTE — Progress Notes (Signed)
[place] Progress Note Patient Name: RAMIE ERMAN DOB: Jul 01, 1986 MRN: [id number]   Date of Service  08/14/2014  HPI/Events of Note   Extubated. RN requesting diet order.   eICU Interventions   Diet ordered.       Intervention Category Minor Interventions: Routine modifications to care plan (e.g. PRN medications for pain, fever)  Leda [place] R. 08/14/2014, 3:50 PM

## 2014-08-14 NOTE — Progress Notes (Signed)
Left message with Warren Lacy for [facility] MD to address pt's diet, SSI and IV fluids.

## 2014-08-14 NOTE — Progress Notes (Signed)
Fentanyl IV infusion stopped per [facility] order. 150 ml of Fentanyl wasted in sink by [employer] Kinzly Pierrelouis, RN and Duanne Moron, RN. Also, E-link RN notified 3rd blood culture return positive with gram positive cocci in clusters. In addition patient temperature 102. E-link MD ordered start cooling blanket.

## 2014-08-14 NOTE — Progress Notes (Addendum)
Patient's heart rate in the 120's. Blood pressure 96/47 MAP 61. Respirations 33 and temperature 101.9. E-link MD aware. Orders for 500cc bolus, tylenol prn, and lactic acid lab placed. Will continue to monitor patient.

## 2014-08-14 NOTE — Progress Notes (Signed)
Patient requested that quad cough not be performed at this time due to his stomach feeling bloated.

## 2014-08-14 NOTE — Progress Notes (Signed)
[place] Progress Note Patient Name: Jason [name] DOB: [birth year] MRN: [id number]   Date of Service  08/14/2014  HPI/Events of Note   RN requests modifications to fluid administration rate and insulin/CBG regimen now that extubated and eating. Unclear why on continuous D5 with K although is hypokalemic.    eICU Interventions   Modifications made to insulin/CBG regimen. No changes to fluids, defer to AM rounder.       Intervention Category Minor Interventions: Routine modifications to care plan (e.g. PRN medications for pain, fever)  Mory Herrman R. 08/14/2014, 5:26 PM

## 2014-08-14 NOTE — Progress Notes (Signed)
Jason [name] Progress Note Patient Name: Jason [name] DOB: [birth year] MRN: [id number]   Date of Service  08/14/2014  HPI/Events of Note   Reviewed culture results.  Patient with known Staph aureus bacteremia. On Vanc and being followed by ID.   eICU Interventions   No intervention.      Intervention Category Minor Interventions: Routine modifications to care plan (e.g. PRN medications for pain, fever)  Jason [name] R. 08/14/2014, 4:47 PM

## 2014-08-14 NOTE — Progress Notes (Signed)
Patient temperature 101.1 an hour after giving tylenol. Heart rate 123. Blood pressure 106/56 MAP 66. Lactic acid 1.8. E-link MD notified. No new orders placed. Will continue to monitor patient.

## 2014-08-14 NOTE — Progress Notes (Signed)
Assessing patient's NG tube, aspirated 290 cc residual. Pt communicated that he did not want residual returned. Education given, pt still refused residual. Will continue to monitor.

## 2014-08-14 NOTE — Progress Notes (Signed)
Patient refused CPT at this time. States he will take the next scheduled one but he wants a break now. No distress noted. RN aware.

## 2014-08-14 NOTE — Progress Notes (Signed)
  Echocardiogram 2D Echocardiogram has been performed.  Mauricio Po 08/14/2014, 4:30 PM

## 2014-08-14 NOTE — Progress Notes (Signed)
PULMONARY / CRITICAL CARE MEDICINE   Name:Jason [name] QBH:[id number] DOB:12-May-1986   ADMISSION DATE: 08/12/2014 CONSULTATION DATE: 08/13/14  REFERRING MD : Georgiann Cocker   CHIEF COMPLAINT: Respiratory distress   INITIAL PRESENTATION: 28 yo M, hx spinal cord infarct and paraplegia presented with AMS, [facility] and [facility] to [facility], [facility] service. Thought to have PNA based on x-ray as well as a UTI. Antibiotics were started and placed in SDU. Found to be acidotic and BiPAP was started. Had worsening resp acidosis requiring intubation.   STUDIES/SIGNIFICANT EVENTS: 11/06 Admitted by [facility] to SDU. Failed trial of BiPAP. Transferred to ICU and intubated. PCCM assumed primary duties 11/06 [place] consult for unstageable sacral pressure ulcer  INDWELLING DEVICES:: ETT 11/06 >> 11/07  MICRO DATA: Urine 11/05 >> GNRs >>  Resp 11/06 >> GPCs, GNRs >>  Blood 11/05 >> GPC clusters >>    ANTIMICROBIALS:  Imipenem 11/05 >> 11/06 Levofloxacin 11/06 >> 11/06 Vanc 11/05 >>  Aztreonam 11/06 >>   SUBJ RASS 0. + F/C. CAM-ICU negative. Passed SBT. Extubated and looks good  Filed Vitals:   08/14/14 0800 08/14/14 1000 08/14/14 1100 08/14/14 1200  BP: 126/67 123/71 113/70 118/74  Pulse: 114 113 109 110  Temp: 98.8 F (37.1 C) 98.4 F (36.9 C) 99 F (37.2 C) 99.1 F (37.3 C)  TempSrc: Oral   Core (Comment)  Resp: 24 21 21 20   Height:      Weight:      SpO2: 99% 96% 100% 98%   Vent Mode:  [-] PSV;CPAP FiO2 (%):  [40 %-50 %] 40 % Set Rate:  [20 bmp] 20 bmp Vt Set:  [400 mL] 400 mL PEEP:  [5 cmH20] 5 cmH20 Pressure Support:  [5 cmH20] 5 cmH20 Plateau Pressure:  [19 cmH20-32 cmH20] 19 cmH20   I/O last 3 completed shifts: In: 3075.2 [I.V.:1965.2; NG/GT:610; IV Piggyback:500] Out: 2530 [Urine:2530] Total I/O In: 322.5 [I.V.:167.5; NG/GT:55; IV Piggyback:100] Out: 300 [Urine:300]  Gen: RASS 0, CAM ICU neg Neuro: paraplegic, EOMI, PERRL HEENT: poor  dentition, otherwise WNL Chest: much improved rhonchi Cardiac: Reg, no M Abdomen: soft, NT, +BS Ext: Bulla on dorsal surface of R  Skin: Sacral decub ulcer  I have reviewed all of today's lab results. Relevant abnormalities are discussed in the A/P section  CXR: improved aeration LLL  ASSESSMENT / PLAN: PULMONARY A:Acute hypercarbic and hypoxemic respiratory failure, resolved Poor cough mechanics Asthmawith evidence of airflow obstruction P:  Monitor in ICU post extubation Supp O2 to maintain SpO2 > 92% Cont nebulized BDs to scheduled Cont nebulized steroids  Cont chest percussion vest Quad cough QID  CARDIOVASCULAR A:  Hx of HTN, Autonomic dysfunction Hypotension, resolved P:  Holding amlodipine Monitor BP and rhythm   RENAL A: Acute renal failure, nonoliguric - improving  P:  Monitor BMET intermittently Monitor I/Os Correct electrolytes as indicated Resume IVFs while NPO  GASTROINTESTINAL A:  Elevated liver enzymes, mild - improving GERD, chronic PPI use Hx of Gastroparesis  P:  SUP: IV pantoprazole NPO post extubation   HEMATOLOGIC A:  Anemia without overt blood loss P:  DVTpx:  LMWH Monitor CBC intermittently Transfuse per usual ICU guidelines   INFECTIOUS A:  UTI Sacral decubitus ulcer (unstageable)  LLL HCAP Bacteremia P:  Micro and abx as above  ENDOCRINE A:  Type 1 DM  P:  Cont Lantus  Cont SSI, sens scale  When diet initiated, will need IVFs stopped, Lantus increased and SSI changes to ACHS  NEUROLOGIC  A: Acute encephalopathy, resolved 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  After 5:30 PM or weekends, call [phone]

## 2014-08-14 NOTE — Procedures (Signed)
Extubation Procedure Note  Patient Details:   Name: Jason [name] DOB: 05/07/1986 MRN: [id number]   Airway Documentation:     Evaluation  O2 sats: stable throughout Complications: No apparent complications Patient did tolerate procedure well. Bilateral Breath Sounds: Rhonchi Suctioning: Airway Yes   Patient extubated to 3L nasal cannula.  Positive cuff leak.  No evidence of stridor.  Patient able to speak after extubation.  Sats currently 98%.  Vitals are stable.  Alphia Moh N 08/14/2014, 9:26 AM

## 2014-08-15 DIAGNOSIS — R6520 Severe sepsis without septic shock: Secondary | ICD-10-CM

## 2014-08-15 DIAGNOSIS — A419 Sepsis, unspecified organism: Secondary | ICD-10-CM | POA: Insufficient documentation

## 2014-08-15 LAB — BASIC METABOLIC PANEL
Anion gap: 12 (ref 5–15)
BUN: 13 mg/dL (ref 6–23)
CALCIUM: 9.6 mg/dL (ref 8.4–10.5)
CO2: 20 mEq/L (ref 19–32)
Chloride: 110 mEq/L (ref 96–112)
Creatinine, Ser: 0.73 mg/dL (ref 0.50–1.35)
GFR calc Af Amer: >90 mL/min (ref >90)
GFR calc non Af Amer: >90 mL/min (ref >90)
GLUCOSE: 127 mg/dL — AB (ref 70–99)
Potassium: 3.4 mEq/L — ABNORMAL LOW (ref 3.7–5.3)
Sodium: 142 mEq/L (ref 137–147)

## 2014-08-15 LAB — GLUCOSE, CAPILLARY
GLUCOSE-CAPILLARY: 95 mg/dL (ref 70–99)
GLUCOSE-CAPILLARY: 67 mg/dL — AB (ref 70–99)
Glucose-Capillary: 130 mg/dL — ABNORMAL HIGH (ref 70–99)
Glucose-Capillary: 68 mg/dL — ABNORMAL LOW (ref 70–99)
Glucose-Capillary: 123 mg/dL — ABNORMAL HIGH (ref 70–99)
Glucose-Capillary: 85 mg/dL (ref 70–99)
Glucose-Capillary: 146 mg/dL — ABNORMAL HIGH (ref 70–99)

## 2014-08-15 LAB — CBC
HEMATOCRIT: 23.1 % — AB (ref 39.0–52.0)
HEMOGLOBIN: 7.6 g/dL — AB (ref 13.0–17.0)
MCH: 25.2 pg — ABNORMAL LOW (ref 26.0–34.0)
MCHC: 32.9 g/dL (ref 30.0–36.0)
MCV: 76.7 fL — AB (ref 78.0–100.0)
Platelets: 477 10*3/uL — ABNORMAL HIGH (ref 150–400)
RBC: 3.01 MIL/uL — ABNORMAL LOW (ref 4.22–5.81)
RDW: 15.8 % — AB (ref 11.5–15.5)
WBC: 28.6 10*3/uL — AB (ref 4.0–10.5)

## 2014-08-15 LAB — VANCOMYCIN, TROUGH: VANCOMYCIN TR: 13.2 ug/mL (ref 10.0–20.0)

## 2014-08-15 LAB — CLOSTRIDIUM DIFFICILE BY PCR: Toxigenic C. Difficile by PCR: NEGATIVE

## 2014-08-15 MED ORDER — MORPHINE SULFATE ER 15 MG PO TBCR
15.0000 mg | EXTENDED_RELEASE_TABLET | Freq: Two times a day (BID) | ORAL | Status: DC
  Administered 2014-08-15 – 2014-08-20 (×11): 15 mg via ORAL
  Filled 2014-08-15 (×11): qty 1

## 2014-08-15 MED ORDER — TRAZODONE HCL 50 MG PO TABS
50.0000 mg | ORAL_TABLET | Freq: Once | ORAL | Status: AC
  Administered 2014-08-15: 50 mg via ORAL
  Filled 2014-08-15: qty 1

## 2014-08-15 MED ORDER — AMLODIPINE BESYLATE 2.5 MG PO TABS
2.5000 mg | ORAL_TABLET | Freq: Every day | ORAL | Status: DC
  Administered 2014-08-15 – 2014-08-18 (×4): 2.5 mg via ORAL
  Filled 2014-08-15 (×4): qty 1

## 2014-08-15 MED ORDER — ATORVASTATIN CALCIUM 20 MG PO TABS
20.0000 mg | ORAL_TABLET | Freq: Every day | ORAL | Status: DC
  Administered 2014-08-15 – 2014-08-26 (×12): 20 mg via ORAL
  Filled 2014-08-15 (×12): qty 1

## 2014-08-15 MED ORDER — SODIUM CHLORIDE 0.9 % IV SOLN: INTRAVENOUS | Status: DC

## 2014-08-15 MED ORDER — PREGABALIN 100 MG PO CAPS
100.0000 mg | ORAL_CAPSULE | Freq: Three times a day (TID) | ORAL | Status: DC
  Administered 2014-08-15 – 2014-08-26 (×36): 100 mg via ORAL
  Filled 2014-08-15 (×14): qty 1
  Filled 2014-08-15: qty 2
  Filled 2014-08-15 (×8): qty 1
  Filled 2014-08-15: qty 2
  Filled 2014-08-15 (×2): qty 1
  Filled 2014-08-15: qty 2
  Filled 2014-08-15 (×5): qty 1
  Filled 2014-08-15: qty 2
  Filled 2014-08-15: qty 1
  Filled 2014-08-15: qty 2
  Filled 2014-08-15: qty 1

## 2014-08-15 MED ORDER — TRAZODONE HCL 100 MG PO TABS
100.0000 mg | ORAL_TABLET | Freq: Every evening | ORAL | Status: DC | PRN
  Administered 2014-08-15 – 2014-08-21 (×3): 100 mg via ORAL
  Filled 2014-08-15 (×6): qty 1

## 2014-08-15 MED ORDER — VANCOMYCIN HCL IN DEXTROSE 750-5 MG/150ML-% IV SOLN
750.0000 mg | Freq: Two times a day (BID) | INTRAVENOUS | Status: DC
  Administered 2014-08-15 – 2014-08-21 (×14): 750 mg via INTRAVENOUS
  Filled 2014-08-15 (×18): qty 150

## 2014-08-15 MED ORDER — POTASSIUM CHLORIDE CRYS ER 20 MEQ PO TBCR
40.0000 meq | EXTENDED_RELEASE_TABLET | Freq: Once | ORAL | Status: AC
  Administered 2014-08-15: 40 meq via ORAL
  Filled 2014-08-15: qty 2

## 2014-08-15 NOTE — Progress Notes (Signed)
PULMONARY / CRITICAL CARE MEDICINE   Name:Jason [name] XMI:[id number] DOB:06/23/1986   ADMISSION DATE: 08/12/2014 CONSULTATION DATE: 08/13/14  REFERRING MD : Georgiann Cocker   CHIEF COMPLAINT: Respiratory distress   INITIAL PRESENTATION: 28 yo M, hx spinal cord infarct and paraplegia presented with AMS, [facility] and transferred to [facility], [facility] service. Thought to have PNA based on x-ray as well as a UTI. Antibiotics were started and placed in SDU. Found to be acidotic and BiPAP was started. Had worsening resp acidosis requiring intubation.   STUDIES/SIGNIFICANT EVENTS: 11/06 Admitted by [facility] to SDU. Failed trial of BiPAP. Transferred to ICU and intubated. PCCM assumed primary duties 11/06 [place] consult for unstageable sacral pressure ulcer 11/07 [place] positive for GPC clusters. ID consultation  INDWELLING DEVICES:: ETT 11/06 >> 11/07  MICRO DATA: Urine 11/05 >> GNRs >>  Resp 11/06 >> GPCs, GNRs >>  Blood 11/05 >> Staph aureus >>    ANTIMICROBIALS:  Imipenem 11/05 >> 11/06 Levofloxacin 11/06 >> 11/06 Vanc 11/05 >>  Aztreonam 11/06 >> 11/07  SUBJ Flat affect. RASS 0. + F/C. CAM-ICU negative. Tolerating extubation  Filed Vitals:   08/15/14 1500 08/15/14 1600 08/15/14 1700 08/15/14 1800  BP:  144/89 153/87 115/72  Pulse: 89 89 87 88  Temp: 99.3 F (37.4 C) 99 F (37.2 C) 99.1 F (37.3 C) 98.4 F (36.9 C)  TempSrc:  Core (Comment)    Resp: 20 18 22 25   Height:      Weight:      SpO2: 97% 99% 99% 100%       I/O last 3 completed shifts: In: 2945.5 [P.O.:125; I.V.:1885.5; NG/GT:535; IV Piggyback:400] Out: 2105 [Urine:2105] Total I/O In: 1244.3 [P.O.:690; I.V.:404.3; IV Piggyback:150] Out: 1010 [Urine:1010]  Gen: Flat affect, NAD Neuro: paraplegic, EOMI, PERRL HEENT: poor dentition, otherwise WNL Chest: clear anteriorly Cardiac: Reg, no M Abdomen: soft, NT, +BS Ext: Bulla on dorsal surface of R  Skin: Sacral decub ulcer  I have  reviewed all of today's lab results. Relevant abnormalities are discussed in the A/P section  CXR: NNF  ASSESSMENT / PLAN: PULMONARY A:Acute hypercarbic and hypoxemic respiratory failure, resolved Poor cough mechanics Asthmawith evidence of airflow obstruction P:  Cont supp O2 to maintain SpO2 > 92% Cont nebulized BDs to scheduled Cont nebulized steroids  Cont chest percussion vest Cont quad cough   CARDIOVASCULAR A:  Hx of HTN, Autonomic dysfunction Hypotension, resolved P:  Resume home dose of amlodipine Monitor BP and rhythm   RENAL A: Acute renal failure, resolved Hyperkalemia P:  Monitor BMET intermittently Monitor I/Os Correct electrolytes as indicated  GASTROINTESTINAL A:  Elevated liver enzymes, resolved GERD, chronic PPI use Hx of Gastroparesis  P:  SUP: PO PPI Cont current diet  HEMATOLOGIC A:  Anemia without overt blood loss P:  DVTpx:  LMWH Monitor CBC intermittently Transfuse per usual guidelines Minimize phlebotomy  INFECTIOUS A:  UTI Sacral decubitus ulcer (unstageable)  LLL HCAP Bacteremia P:  Micro and abx as above ID managing abx  ENDOCRINE A:  Type 1 DM  P:  Cont Lantus  Cont SSI  NEUROLOGIC A: Acute encephalopathy, resolved 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  After 5:30 PM or weekends, call [phone]

## 2014-08-15 NOTE — Progress Notes (Signed)
Hypoglycemic Event  CBG: 68  Treatment: 15 GM carbohydrate snack  Symptoms: None  Follow-up CBG: Time:1723 CBG Result:67   Possible Reasons for Event: Inadequate meal intake  Comments/MD notified:Will repeat carb snack and check again.    Jason [name], Rafael Bihari  Remember to initiate Hypoglycemia Order Set & complete

## 2014-08-15 NOTE — Progress Notes (Signed)
ANTIBIOTIC CONSULT NOTE - INITIAL  Pharmacy Consult for vancomycin Indication: bacteremia  Allergies  Allergen Reactions  . Cefuroxime Axetil Anaphylaxis  . Penicillins Anaphylaxis and Other (See Comments)    ?can take amoxicillin?  [name] [Benzonatate] Anaphylaxis  . Shellfish Allergy Itching and Other (See Comments)    Took benadryl to alleviate reaction    Patient Measurements: Height: 5\' 8"  (172.7 cm) Weight: 129 lb 6.6 oz (58.7 kg) IBW/kg (Calculated) : 68.4 Adjusted Body Weight:   Vital Signs: Temp: 100.4 F (38 C) (11/08 0900) BP: 128/76 mmHg (11/08 0900) Pulse Rate: 106 (11/08 0830) Intake/Output from previous day: 11/07 0701 - 11/08 0700 In: 1497.5 [P.O.:125; I.V.:1117.5; NG/GT:55; IV Piggyback:200] Out: 1775 [Urine:1775] Intake/Output from this shift: Total I/O In: 110 [I.V.:110] Out: 200 [Urine:200]  Labs:  Recent Labs  08/13/14 0235 08/14/14 0329 08/15/14 0325  WBC 30.0* 29.1* 28.6*  HGB 8.5* 7.4* 7.6*  PLT 420* 416* 477*  CREATININE 1.60* 1.22 0.73   Estimated Creatinine Clearance: 112.1 mL/min (by C-G formula based on Cr of 0.73).  Recent Labs  08/15/14 0925  VANCOTROUGH 13.2      Medical History: Past Medical History  Diagnosis Date  . GERD (gastroesophageal reflux disease)   . Asthma   . Hx MRSA infection     on face  . Gastroparesis   . Diabetic neuropathy   . Seizures   . Stroke     spinal stroke in 4/15  . Diabetes mellitus     sees Dr. Loanne Drilling   . Family history of anesthesia complication     Pt mother can't have epidural procedures  . Dysrhythmia   . Pneumonia   . Arthritis   . Fibromyalgia     Medications:  Prescriptions prior to admission  Medication Sig Dispense Refill Last Dose  . albuterol (PROVENTIL) (2.5 MG/3ML) 0.083% nebulizer solution Take 3 mLs (2.5 mg total) by nebulization every 4 (four) hours as needed for wheezing or shortness of breath. 75 mL 3 08/11/2014 at Unknown time  . atorvastatin (LIPITOR)  20 MG tablet Take 20 mg by mouth daily.  0 Past Week at Unknown time  . baclofen (LIORESAL) 10 MG tablet Take 10-20 mg by mouth 3 (three) times daily. Takes 10mg  in morning and lunchtime and takes 20mg  at bedtime   08/12/2014 at Unknown time  . dexlansoprazole (DEXILANT) 60 MG capsule Take 60 mg by mouth daily.   08/12/2014 at Unknown time  . diclofenac sodium (VOLTAREN) 1 % GEL Apply 2 g topically daily as needed (for pain).   08/12/2014 at Unknown time  . diphenoxylate-atropine (LOMOTIL) 2.5-0.025 MG per tablet Take 1 tablet by mouth 2 (two) times daily. 60 tablet 0 Past Week at Unknown time  . fluconazole (DIFLUCAN) 200 MG tablet Take 1 tablet (200 mg total) by mouth daily. 14 tablet 0 08/12/2014 at Unknown time  . furosemide (LASIX) 40 MG tablet Take 0.5 tablets (20 mg total) by mouth daily as needed for fluid. 30 tablet 0 Past Week at Unknown time  . glucagon (GLUCAGON EMERGENCY) 1 MG injection Inject 1 mg into the vein once as needed. 1 each 12 unk  . insulin aspart (NOVOLOG) 100 UNIT/ML injection Inject 5 Units into the skin 3 (three) times daily with meals. 10 mL 2 08/11/14 at Unknown time  . insulin glargine (LANTUS) 100 UNIT/ML injection Inject 0.22 mLs (22 Units total) into the skin at bedtime. (Patient taking differently: Inject 20 Units into the skin at bedtime. ) 10 mL 11  08/12/2014 at Unknown time  . lidocaine (XYLOCAINE) 2 % jelly Apply 1 application topically 4 (four) times daily as needed (pain).   11 Past Week at Unknown time  . loratadine (CLARITIN) 10 MG tablet Take 10 mg by mouth 2 (two) times daily as needed for allergies.   08/12/2014 at Unknown time  . metaxalone (SKELAXIN) 800 MG tablet Take 800 mg by mouth 3 (three) times daily as needed for muscle spasms.   unk at Unknown time  . methocarbamol (ROBAXIN) 750 MG tablet Take 750 mg by mouth 4 (four) times daily as needed.  1 08/12/2014 at Unknown time  . metoCLOPramide (REGLAN) 5 MG tablet Take 1 tablet (5 mg total) by mouth 2 (two)  times a week. And as needed for nausea 8 tablet 3 Past Week at Unknown time  . morphine (MS CONTIN) 30 MG 12 hr tablet Take 1 tablet (30 mg total) by mouth every 12 (twelve) hours. 60 tablet 0 08/12/2014 at Unknown time  . ondansetron (ZOFRAN) 8 MG tablet Take 8 mg by mouth 2 (two) times daily as needed for nausea or vomiting.   5 08/04/14 at Unknown time  . OxyCODONE (OXYCONTIN) 15 mg T12A 12 hr tablet Take 15 mg by mouth every 12 (twelve) hours.   08/11/2014 at Unknown time  . oxyCODONE-acetaminophen (PERCOCET) 10-325 MG per tablet Take 1 tablet by mouth every 8 (eight) hours as needed for pain. 90 tablet 0 08/12/2014 at Unknown time  . pregabalin (LYRICA) 100 MG capsule Take 100-200 mg by mouth 4 (four) times daily. Take 100 mg by mouth in the morning, take 100 mg by mouth in the afternoon, take 100 mg by mouth in the evening and take 200 mg by mouth at bedtime.   08/12/2014 at Unknown time  . silver sulfADIAZINE (SILVADENE) 1 % cream Apply 1 application topically 2 (two) times a week. Tuesday and thursday   08/12/2014 at Unknown time  . amLODipine (NORVASC) 2.5 MG tablet Take 1 tablet (2.5 mg total) by mouth daily. 30 tablet 0 08/11/14  . clindamycin (CLEOCIN) 300 MG capsule Take 1 capsule (300 mg total) by mouth 3 (three) times daily. 21 capsule 0 Completed Course at Unknown time  . glucose blood (ACCU-CHEK AVIVA PLUS) test strip Use to check blood sugar 4 times per day 400 each 2    Assessment: 28 yo paraplegic male with 2/2 staph aureus bacteremia, source unknown could be due to PNA, recent dental procedure or skin/soft tissue.  Pt also is growing GNR in urine cultures, however this is thought to be asymptomatic bacteruria.  Fevers are improving, Tmax/24h 100.8, WBC still elevated at 28.6, renal function is improving.  Vancomycin trough this morning was subtherapeutic, will increase dose.     Goal of Therapy:  Vancomycin trough level 15-20 mcg/ml  Plan:  Increase vancomycin to 750/12h Re-check  trough at steady state Monitor speciation and sensitivities, renal fx, duration of therapy   Hughes Better, PharmD, BCPS Clinical Pharmacist Pager: [phone] 08/15/2014 10:34 AM

## 2014-08-15 NOTE — Progress Notes (Signed)
Patient ID: Jason [name], male   DOB: [birth year], 28 y.o.   MRN: [id number]         [place] [facility]    Date of Admission:  08/12/2014           Day 3 vancomycin  Principal Problem:   Staphylococcus aureus bacteremia Active Problems:   Type 1 diabetes, uncontrolled, with neuropathy   Asthma   GERD   Spinal cord infarction (history of)   Acute encephalopathy   Pneumonia   Sepsis   Acute respiratory failure with hypercapnia   UTI (lower urinary tract infection)   Healthcare-associated pneumonia   Renal failure (ARF), acute on chronic   Sacral pressure sore   . albuterol  2.5 mg Nebulization Q6H  . antiseptic oral rinse  7 mL Mouth Rinse QID  . budesonide  0.25 mg Nebulization Q6H  . chlorhexidine  15 mL Mouth Rinse BID  . collagenase   Topical Daily  . enoxaparin (LOVENOX) injection  40 mg Subcutaneous Q24H  . fentaNYL  100 mcg Transdermal Q72H  . insulin aspart  0-15 Units Subcutaneous TID WC  . insulin glargine  15 Units Subcutaneous Daily  . pantoprazole  40 mg Oral Q1200  . pregabalin  100 mg Oral TID  . sodium chloride  3 mL Intravenous Q12H  . vancomycin  750 mg Intravenous Q12H    Subjective: He states that the severe generalized pain he had upon admission has resolved.   OBJECTIVE: Blood pressure 141/88, pulse 101, temperature 99.9 F (37.7 C), temperature source Core (Comment), resp. rate 20, height 5\' 8"  (1.727 m), weight 129 lb 6.6 oz (58.7 kg), SpO2 99 %. General: some confusion reported overnight Skin: no rash Lungs: clear Cor: regular S1 and S2 with no murmur  Lab Results Lab Results  Component Value Date   WBC 28.6* 08/15/2014   HGB 7.6* 08/15/2014   HCT 23.1* 08/15/2014   MCV 76.7* 08/15/2014   PLT 477* 08/15/2014    Lab Results  Component Value Date   CREATININE 0.73 08/15/2014   BUN 13 08/15/2014   NA 142 08/15/2014   K 3.4* 08/15/2014   CL 110 08/15/2014   CO2 20 08/15/2014    Lab Results  Component  Value Date   ALT 7 08/14/2014   AST 10 08/14/2014   ALKPHOS 153* 08/14/2014   BILITOT 0.2* 08/14/2014     Microbiology: Recent Results (from the past 240 hour(s))  Blood Culture (routine x 2)     Status: None (Preliminary result)   Collection Time: 08/12/14  6:40 PM  Result Value Ref Range Status   Specimen Description BLOOD RIGHT [facility]  Final   Special Requests BOTTLES DRAWN AEROBIC AND ANAEROBIC 5CC EACH  Final   Culture  Setup Time   Final    08/13/2014 01:00 Performed at [facility]    Culture   Final    STAPHYLOCOCCUS AUREUS Note: Gram Stain Report Called to,Read Back By and Verified With: [facility] WORK RN [id number] Performed at [facility]    Report Status PENDING  Incomplete  Urine culture     Status: None (Preliminary result)   Collection Time: 08/12/14  6:40 PM  Result Value Ref Range Status   Specimen Description URINE, CATHETERIZED  Final   Special Requests NONE  Final   Culture  Setup Time   Final    08/13/2014 00:43 Performed at [place]   Final    >=  100,000 COLONIES/ML Performed at [facility] Performed at [facility]    Report Status PENDING  Incomplete  Blood Culture (routine x 2)     Status: None (Preliminary result)   Collection Time: 08/12/14  6:48 PM  Result Value Ref Range Status   Specimen Description BLOOD RIGHT WRIST  Final   Special Requests BOTTLES DRAWN AEROBIC AND ANAEROBIC 10CC EACH  Final   Culture  Setup Time   Final    08/13/2014 00:59 Performed at [facility]    Culture   Final    STAPHYLOCOCCUS AUREUS Note: RIFAMPIN AND GENTAMICIN SHOULD NOT BE USED AS SINGLE DRUGS FOR TREATMENT OF STAPH INFECTIONS. Note: Gram Stain Report Called to,Read Back By and Verified With: Paula Compton RN 57P Performed at [facility]    Report Status PENDING  Incomplete  MRSA PCR Screening     Status: None   Collection Time: 08/13/14  12:43 AM  Result Value Ref Range Status   MRSA by PCR NEGATIVE NEGATIVE Final    Comment:        The GeneXpert MRSA Assay (FDA approved for NASAL specimens only), is one component of a comprehensive MRSA colonization surveillance program. It is not intended to diagnose MRSA infection nor to guide or monitor treatment for MRSA infections.   Culture, respiratory (NON-Expectorated)     Status: None (Preliminary result)   Collection Time: 08/13/14 11:52 AM  Result Value Ref Range Status   Specimen Description TRACHEAL ASPIRATE  Final   Special Requests NONE  Final   Gram Stain   Final    FEW WBC PRESENT,BOTH PMN AND MONONUCLEAR RARE SQUAMOUS EPITHELIAL CELLS PRESENT FEW GRAM POSITIVE COCCI IN PAIRS IN CLUSTERS RARE GRAM NEGATIVE RODS Performed at [facility]    Culture   Final    Culture reincubated for better growth Performed at [facility]    Report Status PENDING  Incomplete   Transthoracic echocardiogram 08/14/2014: No valvular abnormalities noted  Assessment: He is responding to therapy for staph aureus bacteremia.  Plan: 1. Continue vancomycin 2. Await results of repeat blood cultures; hold off on PICC placement until blood cultures negative 3. He'll need a TEE this week  Michel Bickers, MD [facility] [place] [phone] pager   [phone] cell 08/15/2014, 12:51 PM

## 2014-08-15 NOTE — Progress Notes (Signed)
[facility] Progress Note Patient Name: Jason [name] DOB: [birth year] MRN: [id number]   Date of Service  08/15/2014  HPI/Events of Note  C/o neuropathic leg pain.  Takes lyrica at home.  eICU Interventions  Will order lyrica at 100 mg tid (lower than listed home dose) >> can then titrate up as needed.      Intervention Category Major Interventions: Other:  Jason [name] 08/15/2014, 2:28 AM

## 2014-08-15 NOTE — Progress Notes (Signed)
Patient refused CPT at this time. Right lung clear and left lung diminished. RN aware.

## 2014-08-15 NOTE — Progress Notes (Signed)
Hypoglycemic Event  CBG: 67  Treatment: 15 GM carbohydrate snack  Symptoms: None  Follow-up CBG: Time:1739 CBG Result:85  Possible Reasons for Event: Inadequate meal intake  Comments/MD notified:BS increasing will check again within reasonable amount of time    Lael Pilch, Rafael Bihari  Remember to initiate Hypoglycemia Order Set & complete

## 2014-08-15 NOTE — Progress Notes (Addendum)
Patient is unable to sleep, anxious and restless; Pt is A&O times four, but will get confused at times saying that people are in his room. RN is concerned that the patient may be sleep deprived. ELink Md notified and order fir Trazodone placed. Will continue to monitor pt.

## 2014-08-16 DIAGNOSIS — J9602 Acute respiratory failure with hypercapnia: Secondary | ICD-10-CM

## 2014-08-16 DIAGNOSIS — B9561 Methicillin susceptible Staphylococcus aureus infection as the cause of diseases classified elsewhere: Secondary | ICD-10-CM

## 2014-08-16 DIAGNOSIS — G9340 Encephalopathy, unspecified: Secondary | ICD-10-CM

## 2014-08-16 DIAGNOSIS — R7881 Bacteremia: Secondary | ICD-10-CM

## 2014-08-16 DIAGNOSIS — J4520 Mild intermittent asthma, uncomplicated: Secondary | ICD-10-CM

## 2014-08-16 LAB — BASIC METABOLIC PANEL
Anion gap: 14 (ref 5–15)
BUN: 7 mg/dL (ref 6–23)
CHLORIDE: 107 meq/L (ref 96–112)
CO2: 19 mEq/L (ref 19–32)
Calcium: 9.6 mg/dL (ref 8.4–10.5)
Creatinine, Ser: 0.66 mg/dL (ref 0.50–1.35)
GFR calc Af Amer: >90 mL/min (ref >90)
GFR calc non Af Amer: >90 mL/min (ref >90)
Glucose, Bld: 181 mg/dL — ABNORMAL HIGH (ref 70–99)
Potassium: 4.1 mEq/L (ref 3.7–5.3)
SODIUM: 140 meq/L (ref 137–147)

## 2014-08-16 LAB — CULTURE, RESPIRATORY

## 2014-08-16 LAB — CULTURE, BLOOD (ROUTINE X 2)

## 2014-08-16 LAB — GLUCOSE, CAPILLARY
GLUCOSE-CAPILLARY: 246 mg/dL — AB (ref 70–99)
GLUCOSE-CAPILLARY: 191 mg/dL — AB (ref 70–99)
GLUCOSE-CAPILLARY: 111 mg/dL — AB (ref 70–99)
Glucose-Capillary: 172 mg/dL — ABNORMAL HIGH (ref 70–99)
Glucose-Capillary: 136 mg/dL — ABNORMAL HIGH (ref 70–99)

## 2014-08-16 LAB — CULTURE, RESPIRATORY W GRAM STAIN

## 2014-08-16 LAB — URINE CULTURE: Colony Count: >=100000

## 2014-08-16 MED ORDER — METHOCARBAMOL 500 MG PO TABS
500.0000 mg | ORAL_TABLET | Freq: Four times a day (QID) | ORAL | Status: DC | PRN
  Administered 2014-08-19: 500 mg via ORAL
  Filled 2014-08-16 (×2): qty 1

## 2014-08-16 MED ORDER — HEPARIN SODIUM (PORCINE) 5000 UNIT/ML IJ SOLN
5000.0000 [IU] | Freq: Three times a day (TID) | INTRAMUSCULAR | Status: DC
  Administered 2014-08-16 – 2014-08-26 (×32): 5000 [IU] via SUBCUTANEOUS
  Filled 2014-08-16 (×36): qty 1

## 2014-08-16 MED ORDER — FUROSEMIDE 20 MG PO TABS
20.0000 mg | ORAL_TABLET | Freq: Every day | ORAL | Status: AC
  Administered 2014-08-16 – 2014-08-17 (×2): 20 mg via ORAL
  Filled 2014-08-16 (×3): qty 1

## 2014-08-16 MED ORDER — OXYCODONE-ACETAMINOPHEN 5-325 MG PO TABS
1.0000 | ORAL_TABLET | ORAL | Status: DC | PRN
  Administered 2014-08-16 – 2014-08-20 (×7): 2 via ORAL
  Filled 2014-08-16 (×8): qty 2

## 2014-08-16 MED ORDER — SODIUM CHLORIDE 0.9 % IV SOLN
INTRAVENOUS | Status: DC
  Administered 2014-08-16: 18:00:00 via INTRAVENOUS

## 2014-08-16 NOTE — Progress Notes (Signed)
Patient complaining of chronic, generalized pain asking for his home percocet dosing. MD contacted and orders received.

## 2014-08-16 NOTE — Progress Notes (Signed)
Report received for transfer to [phone]

## 2014-08-16 NOTE — Progress Notes (Signed)
    [facility] has been requested to perform a transesophageal echocardiogram on 08/17/2014 for bacteremia.  After careful review of history and examination, the risks and benefits of transesophageal echocardiogram have been explained including risks of esophageal damage, perforation (1:10,000 risk), bleeding, pharyngeal hematoma as well as other potential complications associated with conscious sedation including aspiration, arrhythmia, respiratory failure and death. Alternatives to treatment were discussed, questions were answered. Patient is willing to proceed. Mother at bedside, all questions answered.  Patient does have some anemia, with hgb 7.6, however appear to be stable.  Almyra [name], PA-C 08/16/2014 6:01 PM

## 2014-08-16 NOTE — Progress Notes (Signed)
PULMONARY / CRITICAL CARE MEDICINE  Name:Jason [name] CHE:[id number] DOB:06/09/1986   ADMISSION DATE: 08/12/2014 CONSULTATION DATE: 08/13/14  REFERRING MD : Georgiann Cocker   CHIEF COMPLAINT: Respiratory distress   INITIAL PRESENTATION: 28 yo M, hx spinal cord infarct and paraplegia presented with AMS, fever to [facility] and transferred to [facility], [facility] service. Thought to have PNA based on x-ray as well as a UTI. Antibiotics were started and placed in SDU. Found to be acidotic and BiPAP was started. Had worsening resp acidosis requiring intubation.   STUDIES/SIGNIFICANT EVENTS: 11/06 Admitted by [facility] to SDU. Failed trial of BiPAP. Transferred to ICU and intubated. PCCM assumed primary duties 11/06 [place] consult for unstageable sacral pressure ulcer 11/07 MSSA and enteroccus bacteremia noted,  ID consultation 11/7- echo 55% , trivial MR, TR, no veg  INDWELLING DEVICES:: ETT 11/06 >> 11/07  MICRO DATA: Urine 11/05 >> GNRs >>  Resp 11/06 >> GPCs, GNRs >>  Blood 11/05 >> Staph aureus >>   ANTIMICROBIALS:  Imipenem 11/05 >> 11/06 Levofloxacin 11/06 >> 11/06 Vanc 11/05 >>  Aztreonam 11/06 >> 11/07  SUBJ Awake,no distress, no pressors  Filed Vitals:   08/16/14 0600 08/16/14 0700 08/16/14 0723 08/16/14 0800  BP:  132/85  139/85  Pulse: 93 88  97  Temp: 99.1 F (37.3 C) 98.4 F (36.9 C)  98.6 F (37 C)  TempSrc:    Core (Comment)  Resp: 21 19  22   Height:      Weight:      SpO2: 94% 97% 99% 96%       I/O last 3 completed shifts: In: 2094.3 [P.O.:690; I.V.:1004.3; IV Piggyback:400] Out: 5361 [Urine:3185] Total I/O In: 10 [Other:10] Out: 215 [Urine:215]  Gen: Flat affect, NAD Neuro: paraplegic, PERRL HEENT: poor dentition, otherwise WNL Chest: clear lungs Cardiac: s1 s2 RRR Abdomen: soft, NT, +BS Ext: Bulla on dorsal surface of R  Skin: Sacral decub ulcer, no changes  I have reviewed all of today's lab results. Relevant abnormalities are  discussed in the A/P section  CXR: NNF  ASSESSMENT / PLAN: PULMONARY A:Acute hypercarbic and hypoxemic respiratory failure, resolved Pulm edema Asthmawith evidence of airflow obstruction P:  Cont supp O2 to maintain SpO2 > 92% Cont nebulized BDs to scheduled Cont nebulized steroids  Cont chest percussion vest Consider neg balance  CARDIOVASCULAR A:  Hx of HTN, Autonomic dysfunction Hypotension, resolved R/o endocarditis with presentation bacteremia and pulm edema P:  amlodipine Tele Consider TEE  RENAL A: Acute renal failure, resolved pulm edema P:  Chem in am  Consider lasix with prior edema if O2 needs worsen  GASTROINTESTINAL A:  Elevated liver enzymes, resolved GERD, chronic PPI use Hx of Gastroparesis  P:  SUP: PO PPI Cont current diet  HEMATOLOGIC A:  Anemia without overt blood loss P:  DVTpx:  LMWH, concerned about with such low body mass Monitor CBC intermittently Transfuse per usual guidelines Minimize phlebotomy  INFECTIOUS A:  UTI Sacral decubitus ulcer (unstageable)  LLL HCAP Bacteremia P:  Micro and abx as above ID managing abx Consider TEE May need vanc course  ENDOCRINE A:  Type 1 DM  P:  Cont Lantus , no changes needed Cont SSI  NEUROLOGIC A: Acute encephalopathy, resolved 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 Titus Mould, MD, [place] Pgr: [facility]

## 2014-08-16 NOTE — Progress Notes (Signed)
NURSING PROGRESS NOTE  Jason [name] [id number] Transfer Data: 08/16/2014 4:21 PM Attending Provider: Collene Gobble, MD PCP:FRY,STEPHEN A, MD Code Status: Full   Jason [name] is a 28 y.o. male patient transferred from [place] -No acute distress noted.  -No complaints of shortness of breath.  -No complaints of chest pain.   Blood pressure 124/80, pulse 84, temperature 98.3 F (36.8 C), temperature source Oral, resp. rate 20, height 5\' 8"  (1.727 m), weight 58.7 kg (129 lb 6.6 oz), SpO2 98 %.   IV Fluids:  IV in place, occlusive dsg intact without redness, IV cath   Allergies:  Cefuroxime axetil; Penicillins; Tessalon; and Shellfish allergy  Past Medical History:   has a past medical history of GERD (gastroesophageal reflux disease); Asthma; MRSA infection; Gastroparesis; Diabetic neuropathy; Seizures; Stroke; Diabetes mellitus; Family history of anesthesia complication; Dysrhythmia; Pneumonia; Arthritis; and Fibromyalgia.  Past Surgical History:   has past surgical history that includes Tonsillectomy and Multiple extractions with alveoloplasty (N/A, 08/03/2014).  Social History:   reports that he has been smoking Cigars and Cigarettes.  He has been smoking about 0.00 packs per day. He has never used smokeless tobacco. He reports that he does not drink alcohol or use illicit drugs.  Skin: pressure sores  Patient/Family orientated to room. Information packet given to patient/family. Admission inpatient armband information verified with patient/family to include name and date of birth and placed on patient arm. Side rails up x 2, fall assessment and education completed with patient/family. Patient/family able to verbalize understanding of risk associated with falls and verbalized understanding to call for assistance before getting out of bed. Call light within reach. Patient/family able to voice and demonstrate understanding of unit orientation instructions.    Will continue to evaluate  and treat per MD orders.
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
## 2014-08-17 ENCOUNTER — Encounter (HOSPITAL_COMMUNITY): Payer: Self-pay | Admitting: *Deleted

## 2014-08-17 ENCOUNTER — Encounter (HOSPITAL_COMMUNITY)
Admission: EM | Disposition: A | Discharge status: Home / Self Care | Payer: Self-pay | Source: Home / Self Care | Attending: Internal Medicine

## 2014-08-17 ENCOUNTER — Ambulatory Visit: Payer: Medicaid Other | Admitting: Occupational Therapy

## 2014-08-17 ENCOUNTER — Inpatient Hospital Stay (HOSPITAL_COMMUNITY): Payer: Medicaid Other

## 2014-08-17 ENCOUNTER — Ambulatory Visit: Payer: Medicaid Other

## 2014-08-17 ENCOUNTER — Ambulatory Visit: Payer: Medicaid Other | Admitting: Physical Medicine & Rehabilitation

## 2014-08-17 DIAGNOSIS — R7881 Bacteremia: Secondary | ICD-10-CM | POA: Insufficient documentation

## 2014-08-17 DIAGNOSIS — I341 Nonrheumatic mitral (valve) prolapse: Secondary | ICD-10-CM

## 2014-08-17 HISTORY — PX: TEE WITHOUT CARDIOVERSION: SHX5443

## 2014-08-17 LAB — GLUCOSE, CAPILLARY
GLUCOSE-CAPILLARY: 154 mg/dL — AB (ref 70–99)
GLUCOSE-CAPILLARY: 105 mg/dL — AB (ref 70–99)
Glucose-Capillary: 211 mg/dL — ABNORMAL HIGH (ref 70–99)
Glucose-Capillary: 135 mg/dL — ABNORMAL HIGH (ref 70–99)

## 2014-08-17 SURGERY — ECHOCARDIOGRAM, TRANSESOPHAGEAL
Anesthesia: Moderate Sedation

## 2014-08-17 MED ORDER — LORAZEPAM 0.5 MG PO TABS
0.5000 mg | ORAL_TABLET | Freq: Four times a day (QID) | ORAL | Status: DC | PRN
  Administered 2014-08-17 – 2014-08-20 (×5): 0.5 mg via ORAL
  Filled 2014-08-17 (×5): qty 1

## 2014-08-17 MED ORDER — DIPHENHYDRAMINE HCL 50 MG/ML IJ SOLN
INTRAMUSCULAR | Status: AC
  Filled 2014-08-17: qty 1

## 2014-08-17 MED ORDER — PRO-STAT SUGAR FREE PO LIQD
30.0000 mL | Freq: Every day | ORAL | Status: DC
  Administered 2014-08-18 – 2014-08-26 (×9): 30 mL via ORAL
  Filled 2014-08-17 (×9): qty 30

## 2014-08-17 MED ORDER — FENTANYL CITRATE 0.05 MG/ML IJ SOLN
INTRAMUSCULAR | Status: AC
  Filled 2014-08-17: qty 4

## 2014-08-17 MED ORDER — GLUCERNA SHAKE PO LIQD
237.0000 mL | Freq: Three times a day (TID) | ORAL | Status: DC
  Administered 2014-08-17 – 2014-08-26 (×24): 237 mL via ORAL
  Filled 2014-08-17 (×2): qty 237

## 2014-08-17 MED ORDER — GADOBENATE DIMEGLUMINE 529 MG/ML IV SOLN
10.0000 mL | Freq: Once | INTRAVENOUS | Status: AC | PRN
  Administered 2014-08-17: 10 mL via INTRAVENOUS

## 2014-08-17 MED ORDER — FENTANYL CITRATE 0.05 MG/ML IJ SOLN
INTRAMUSCULAR | Status: DC | PRN
  Administered 2014-08-17 (×2): 25 ug via INTRAVENOUS

## 2014-08-17 MED ORDER — MIDAZOLAM HCL 10 MG/2ML IJ SOLN
INTRAMUSCULAR | Status: DC | PRN
Start: 2014-08-17 — End: 2014-08-17
  Administered 2014-08-17: 2 mg via INTRAVENOUS

## 2014-08-17 MED ORDER — ALBUTEROL SULFATE (2.5 MG/3ML) 0.083% IN NEBU
2.5000 mg | INHALATION_SOLUTION | Freq: Three times a day (TID) | RESPIRATORY_TRACT | Status: DC
  Administered 2014-08-18 – 2014-08-26 (×26): 2.5 mg via RESPIRATORY_TRACT
  Filled 2014-08-17 (×26): qty 3

## 2014-08-17 MED ORDER — LIDOCAINE VISCOUS 2 % MT SOLN
OROMUCOSAL | Status: AC
  Filled 2014-08-17: qty 15

## 2014-08-17 MED ORDER — MIDAZOLAM HCL 5 MG/ML IJ SOLN
INTRAMUSCULAR | Status: AC
  Filled 2014-08-17: qty 3

## 2014-08-17 NOTE — Progress Notes (Signed)
NUTRITION FOLLOW-UP  Pt meets criteria for SEVERE MALNUTRITION in the context of acute as evidenced by 8% weight loss x 3 weeks and intake of </= 50% of his estimated needs in >/= 5 days.  DOCUMENTATION CODES Per approved criteria  -Severe malnutrition in the context of chronic illness   INTERVENTION: Glucerna Shake po TID, each supplement provides 220 kcal and 10 grams of protein  30 ml Prostat daily  NUTRITION DIAGNOSIS: Inadequate oral intake now related to a decreased appetite as evidenced by meal completion </= 50%; ongoing.   Goal: Pt to meet >/= 90% of their estimated nutrition needs; not met.    Monitor:  PO intake, supplement acceptance, labs  ASSESSMENT: Pt with hx of type 1 DM, gastroparesis, spinal cord infarct and paraplegia who was admitted with AMS. Antibiotics started for UTI and PNA. Pt failed BiPAP and was intubated.  Pt had multiple teeth extractions 2 weeks ago.  Pt with 2 wounds present, WOC following.   Pt extubated 11/7 and now on CHO Modified diet. Pt seen with lunch at bedside. Pt had consumed almost 1/2 of his cheeseburger. Pt and mom report decreased appetite but no nausea. Pt has glucerna shake ordered TID. Per pt he prefers glucerna over ensure and would like to continue with these.  Pt agreeable to trying prostat for additional protein.   Height: Ht Readings from Last 1 Encounters:  08/13/14 5' 8"  (1.727 m)    Weight: Wt Readings from Last 1 Encounters:  08/13/14 129 lb 6.6 oz (58.7 kg)    BMI:  Body mass index is 19.68 kg/(m^2).  Estimated Nutritional Needs: Kcal: 8453 Protein: 100-120 grams Fluid: > 1.8 L/day  Skin:  Stage II wound on sacrum Ustageable wound to right ischium Scar tissue with intact skin to left buttocks  Diet Order: Diet Carb Modified Meal Completion: 50%   Intake/Output Summary (Last 24 hours) at 08/17/14 1508 Last data filed at 08/17/14 1000  Gross per 24 hour  Intake      3 ml  Output   1950 ml  Net   -1947 ml    Last BM: 11/10  Labs:   Recent Labs Lab 08/14/14 0329 08/15/14 0325 08/16/14 0259  NA 139 142 140  K 3.5* 3.4* 4.1  CL 106 110 107  CO2 17* 20 19  BUN 29* 13 7  CREATININE 1.22 0.73 0.66  CALCIUM 9.5 9.6 9.6  GLUCOSE 264* 127* 181*    CBG (last 3)   Recent Labs  08/16/14 2143 08/17/14 0756 08/17/14 1206  GLUCAP 136* 211* 135*    Scheduled Meds: . albuterol  2.5 mg Nebulization Q6H  . amLODipine  2.5 mg Oral Daily  . atorvastatin  20 mg Oral Daily  . budesonide  0.25 mg Nebulization Q6H  . collagenase   Topical Daily  . feeding supplement (GLUCERNA SHAKE)  237 mL Oral TID BM  . heparin subcutaneous  5,000 Units Subcutaneous 3 times per day  . insulin aspart  0-15 Units Subcutaneous TID WC  . insulin glargine  15 Units Subcutaneous Daily  . morphine  15 mg Oral Q12H  . pantoprazole  40 mg Oral Q1200  . pregabalin  100 mg Oral TID  . sodium chloride  3 mL Intravenous Q12H  . vancomycin  750 mg Intravenous Q12H    Continuous Infusions:   Maylon Peppers RD, LDN, CNSC [phone] Pager [phone] After Hours Pager

## 2014-08-17 NOTE — Progress Notes (Signed)
PROGRESS NOTE  Jason [name] JEH:[id number] DOB: September 19, 1986 DOA: 08/12/2014 PCP: [name], MD  HPI: Jason [name] is a 28 y.o. 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 Patient was started on vancomycin and meropenem in addition to IV fluids and transferred to our hospital. Patient is confused and cannot give anysignificant history.patient was on BiPAP and initially refused intubation for airway protection.patient was found to be hypothermic and he was started on bear hugger Called patient. Advised patient of provider's approval for requested procedure, as well as any comments/instructions from provider. Patient had 7 of is the removed 2 weeks ago.patient's respiratory rate was 7 and he received Narcan in the emergency room 2 prior To transfer  28 yo M, hx spinal cord infarct and paraplegia presented with AMS, fever to [facility] and transferred to [facility] service. Thought to have PNA based on x-ray as well as a UTI. Antibiotics were started and placed in SDU. Found to be acidotic and BiPAP was started. Had worsening resp acidosis requiring intubation.   STUDIES/SIGNIFICANT EVENTS: 11/06 Admitted by [facility] to SDU. Failed trial of BiPAP. Transferred to ICU and intubated. PCCM assumed primary duties 11/06 [place] consult for unstageable sacral pressure ulcer 11/07 MSSA and enteroccus bacteremia noted, ID consultation 11/7- echo 55% , trivial MR, TR, no veg  Subjective/ 24 H Interval events No complaints this morning, awaiting his TEE  Assessment/Plan: Principal Problem:   Staphylococcus aureus bacteremia Active Problems:   Type 1 diabetes, uncontrolled, with neuropathy   Asthma   GERD   Spinal cord infarction (history of)   Acute encephalopathy   Pneumonia   Sepsis   Acute respiratory failure with hypercapnia   UTI (lower urinary tract  infection)   Healthcare-associated pneumonia   Renal failure (ARF), acute on chronic   Sacral pressure sore   Severe sepsis   Staph aureus bacteremia  - she had a TEE today without vegetations. Continue vancomycin. 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 Contaminant  Diabetes mellitus - continue Lantus 15 units daily and sliding scale insulin - Continue Lyrica for neuropathy  HCAP - he is growing Staphylococcus aureus in his sputum, continue vancomycin per ID.  Right hip wound - wound care consult, will obtain MRI hip per ID to evaluate for osteomyelitis  Acute encephalopathy - This is new per his parents, he was never encephalopathic in the past. Obtain brain MRI. - likely multifactorial due to recent ICU stay, medications  HLD - continue Lipitor  Chronic pain - continue her narcotics  Diet: diabetic diet Fluids: none DVT Prophylaxis: heparin  Code Status: Full code Family Communication: discussed with parents bedside  Disposition Plan: inpatient, home when ready   Consultants:  ID  PCCM  Cardiology   Procedures:  2D echo - Normal LV wall thickness and chamber size with LVEF 50-55%, Trivial mitral and tricuspid regurgitation. No obvious valvular vegetations.  TEE Study Conclusions - Left ventricle: Systolic function was normal. The estimatedejection fraction was in the range of 55% to 60%. Wall motion was normal; there were no regional wall motion abnormalities.- Aortic valve: No evidence of  vegetation.- Mitral valve: No evidence of vegetation. There was mild regurgitation. - Left atrium: No evidence of thrombus in the atrial cavity or appendage. No evidence of  thrombus in the atrial cavity or appendage. - Right atrium: No evidence of thrombus in the atrial cavity or appendage. No evidence of thrombus in the atrial cavity or appendage. - Atrial septum: No defect or patent foramen ovale was identified. Echo contrast study showed no right-to-left atrial level shunt, following an increase in RA pressure induced by provocative maneuvers. - Tricuspid valve: No evidence of vegetation. - Pulmonic valve: No evidence of vegetation.    Antibiotics  Anti-infectives    Start     Dose/Rate Route Frequency Ordered Stop   08/15/14 1100  [MAR Hold]  vancomycin (VANCOCIN) IVPB 750 mg/150 ml premix     (MAR Hold since 08/17/14 0954)   750 mg150 mL/hr over 60 Minutes Intravenous Every 12 hours 08/15/14 1031     08/13/14 2000  vancomycin (VANCOCIN) IVPB 750 mg/150 ml premix  Status:  Discontinued     750 mg150 mL/hr over 60 Minutes Intravenous Every 24 hours 08/13/14 0235 08/13/14 0750   08/13/14 1400  aztreonam (AZACTAM) 1 g in dextrose 5 % 50 mL IVPB  Status:  Discontinued     1 g100 mL/hr over 30 Minutes Intravenous 3 times per day 08/13/14 0646 08/14/14 1254   08/13/14 1000  vancomycin (VANCOCIN) 500 mg in sodium chloride 0.9 % 100 mL IVPB  Status:  Discontinued     500 mg100 mL/hr over 60 Minutes Intravenous Every 12 hours 08/13/14 0750 08/15/14 1031   08/13/14 0800  levofloxacin (LEVAQUIN) IVPB 750 mg  Status:  Discontinued     750 mg100 mL/hr over 90 Minutes Intravenous Every 24 hours 08/13/14 0639 08/13/14 1108   08/13/14 0400  fluconazole (DIFLUCAN) IVPB 200 mg  Status:  Discontinued     200 mg100 mL/hr over 60 Minutes Intravenous Every 24 hours 08/13/14 0148 08/13/14 1108   08/13/14 0245  meropenem (MERREM) 1 g in sodium chloride 0.9 % 100 mL IVPB  Status:  Discontinued     1 g200 mL/hr over 30 Minutes Intravenous Every 12 hours 08/13/14 0234 08/13/14 0639   08/12/14 1945  vancomycin (VANCOCIN) IVPB 1000 mg/200 mL premix     1,000 mg200 mL/hr over  60 Minutes Intravenous  Once 08/12/14 1935 08/12/14 2140   08/12/14 1945  imipenem-cilastatin (PRIMAXIN) 500 mg in sodium chloride 0.9 % 100 mL IVPB     500 mg200 mL/hr over 30 Minutes Intravenous  Once 08/12/14 1937 08/12/14 2036      Studies No results found.  Objective  Filed Vitals:   08/17/14 1100 08/17/14 1105 08/17/14 1110 08/17/14 1118  BP: 108/62 110/78 112/66 101/74  Pulse: 91 88 89 90  Temp:      TempSrc:      Resp: 19 19 19 22   Height:      Weight:      SpO2: 97% 100% 98% 95%    Intake/Output Summary (Last 24 hours) at 08/17/14 1140 Last data filed at 08/17/14 1000  Gross per 24 hour  Intake      3 ml  Output   2600 ml  Net  -2597 ml   Filed Weights   08/12/14 1829 08/13/14 0045  Weight: 56.7 kg (125 lb) 58.7 kg (129 lb 6.6 oz)   Exam:  General:  No acute distress, confused  Cardiovascular: regular rate and rhythm  Respiratory: coarse breath sounds from anterior auscultation  Abdomen: soft, nontender  MSK: no peripheral edema  Neuro: 0/5 strength in the lower extremities, spastic upper extremities  Data Reviewed: Basic Metabolic Panel:  Recent Labs Lab 08/12/14 1840 08/13/14 0235 08/14/14 0329 08/15/14 0325 08/16/14 0259  NA 137 138 139 142 140  K 5.5* 4.3 3.5* 3.4* 4.1  CL 100 104 106 110 107  CO2 23 18* 17* 20 19  GLUCOSE 301* 271* 264* 127* 181*  BUN 41* 34* 29* 13 7  CREATININE 2.40* 1.60* 1.22 0.73 0.66  CALCIUM 10.3 9.4 9.5 9.6 9.6   Liver Function Tests:  Recent Labs Lab 08/12/14 1840 08/14/14 0329  AST 10 10  ALT 7 7  ALKPHOS 169* 153*  BILITOT 0.3 0.2*  PROT 7.9 6.6  ALBUMIN 2.7* 2.0*    Recent Labs Lab 08/12/14 1840  LIPASE 8*   CBC:  Recent Labs Lab 08/12/14 1840 08/13/14 0235 08/14/14 0329 08/15/14 0325  WBC 23.8* 30.0* 29.1* 28.6*  NEUTROABS 20.6*  --   --   --   HGB 8.8* 8.5* 7.4* 7.6*  HCT 27.8* 26.8* 23.0* 23.1*  MCV 81.8 81.5 78.5 76.7*  PLT 425* 420* 416* 477*   BNP (last 3  results)  Recent Labs  09/28/13 0430 10/02/13 0445 01/29/14 2113  PROBNP 673.0* 2451.0* 1343.0*   CBG:  Recent Labs Lab 08/16/14 0738 08/16/14 1139 08/16/14 1646 08/16/14 2143 08/17/14 0756  GLUCAP 191* 172* 111* 136* 211*    Recent Results (from the past 240 hour(s))  Blood Culture (routine x 2)     Status: None   Collection Time: 08/12/14  6:40 PM  Result Value Ref Range Status   Specimen Description BLOOD RIGHT [facility]  Final   Special Requests BOTTLES DRAWN AEROBIC AND ANAEROBIC 5CC EACH  Final   Culture  Setup Time   Final    08/13/2014 01:00 Performed at [facility]    Culture   Final    STAPHYLOCOCCUS AUREUS Note: SUSCEPTIBILITIES PERFORMED ON PREVIOUS CULTURE WITHIN THE LAST 5 DAYS. Note: Gram Stain Report Called to,Read Back By and Verified With: [facility] WORK RN [phone] Performed at [facility]    Report Status 08/16/2014 FINAL  Final  Urine culture     Status: None   Collection Time: 08/12/14  6:40 PM  Result Value Ref Range Status   Specimen Description URINE, CATHETERIZED  Final   Special Requests NONE  Final   Culture  Setup Time   Final    08/13/2014 00:43 Performed at [place]   Final    >=100,000 COLONIES/ML Performed at [place]   Final    KLEBSIELLA PNEUMONIAE Performed at [facility]    Report Status 08/16/2014 FINAL  Final   Organism ID, Bacteria KLEBSIELLA PNEUMONIAE  Final      Susceptibility   Klebsiella pneumoniae - MIC*    AMPICILLIN >=32 RESISTANT Resistant     CEFAZOLIN <=4 SENSITIVE Sensitive     CEFTRIAXONE <=1 SENSITIVE Sensitive     CIPROFLOXACIN 0.5 SENSITIVE Sensitive     GENTAMICIN <=1 SENSITIVE Sensitive     LEVOFLOXACIN 2 SENSITIVE Sensitive     NITROFURANTOIN 64 INTERMEDIATE Intermediate     TOBRAMYCIN <=1 SENSITIVE Sensitive     TRIMETH/SULFA <=20 SENSITIVE Sensitive     PIP/TAZO 32 INTERMEDIATE Intermediate     * KLEBSIELLA PNEUMONIAE   Blood Culture (routine x 2)     Status: None   Collection Time:  08/12/14  6:48 PM  Result Value Ref Range Status   Specimen Description BLOOD RIGHT [place]RI[place]  Final   Special Requests BOTTLES DRAWN AEROBIC AND ANAEROBIC 10CC EACH  Final   Culture  Setup Time   Final    08/13/2014 00:59 Performed at [facility]    Culture   Final    STAPHYLOCOCCUS AUREUS Note: RIFAMPIN AND GENTAMICIN SHOULD NOT BE USED AS SINGLE DRUGS FOR TREATMENT OF STAPH INFECTIONS. ENTEROCOCCUS SPECIES Note: COMBINATION THERAPY OF HIGH DOSE AMPICILLIN OR VANCOMYCIN, PLUS AN AMINOGLYCOSIDE, IS USUALLY INDICATED FOR SERIOUS ENTEROCOCCAL INFECTIONS. Note: Gram Stain Report Called to,Read Back By and Verified With: Paula Compton RN 700P    Report Status 08/16/2014 FINAL  Final   Organism ID, Bacteria STAPHYLOCOCCUS AUREUS  Final   Organism ID, Bacteria ENTEROCOCCUS SPECIES  Final      Susceptibility   Staphylococcus aureus - MIC*    CLINDAMYCIN >=8 RESISTANT Resistant     ERYTHROMYCIN >=8 RESISTANT Resistant     GENTAMICIN <=0.5 SENSITIVE Sensitive     LEVOFLOXACIN 0.25 SENSITIVE Sensitive     OXACILLIN 1 SENSITIVE Sensitive     PENICILLIN >=0.5 RESISTANT Resistant     RIFAMPIN <=0.5 SENSITIVE Sensitive     TRIMETH/SULFA 20 SENSITIVE Sensitive     VANCOMYCIN <=0.5 SENSITIVE Sensitive     TETRACYCLINE >=16 RESISTANT Resistant     MOXIFLOXACIN* <=0.25 SENSITIVE Sensitive      * SET UP TIME:  [id number]    * STAPHYLOCOCCUS AUREUS   Enterococcus species - MIC*    AMPICILLIN <=2 SENSITIVE Sensitive     VANCOMYCIN 1 SENSITIVE Sensitive     * ENTEROCOCCUS SPECIES  MRSA PCR Screening     Status: None   Collection Time: 08/13/14 12:43 AM  Result Value Ref Range Status   MRSA by PCR NEGATIVE NEGATIVE Final    Comment:        The GeneXpert MRSA Assay (FDA approved for NASAL specimens only), is one component of a comprehensive MRSA colonization surveillance program. It is not intended to diagnose  MRSA infection nor to guide or monitor treatment for MRSA infections.   Culture, respiratory (NON-Expectorated)     Status: None   Collection Time: 08/13/14 11:52 AM  Result Value Ref Range Status   Specimen Description TRACHEAL ASPIRATE  Final   Special Requests NONE  Final   Gram Stain   Final    FEW WBC PRESENT,BOTH PMN AND MONONUCLEAR RARE SQUAMOUS EPITHELIAL CELLS PRESENT FEW GRAM POSITIVE COCCI IN PAIRS IN CLUSTERS RARE GRAM NEGATIVE RODS Performed at [facility]    Culture   Final    ABUNDANT STAPHYLOCOCCUS AUREUS Note: RIFAMPIN AND GENTAMICIN SHOULD NOT BE USED AS SINGLE DRUGS FOR TREATMENT OF STAPH INFECTIONS. Performed at [facility]    Report Status 08/16/2014 FINAL  Final   Organism ID, Bacteria STAPHYLOCOCCUS AUREUS  Final      Susceptibility   Staphylococcus aureus - MIC*    CLINDAMYCIN >=8 RESISTANT Resistant     ERYTHROMYCIN >=8 RESISTANT Resistant     GENTAMICIN <=0.5 SENSITIVE Sensitive     LEVOFLOXACIN 0.25 SENSITIVE Sensitive     OXACILLIN 1 SENSITIVE Sensitive     PENICILLIN >=0.5 RESISTANT Resistant     RIFAMPIN <=0.5 SENSITIVE Sensitive     TRIMETH/SULFA <=10 SENSITIVE Sensitive     VANCOMYCIN 1 SENSITIVE Sensitive     TETRACYCLINE >=16 RESISTANT Resistant     MOXIFLOXACIN <=0.25 SENSITIVE Sensitive     * ABUNDANT  STAPHYLOCOCCUS AUREUS  Culture, blood (routine x 2)     Status: None (Preliminary result)   Collection Time: 08/14/14  3:20 PM  Result Value Ref Range Status   Specimen Description BLOOD RIGHT ARM  Final   Special Requests BOTTLES DRAWN AEROBIC ONLY [place]  Final   Culture  Setup Time   Final    08/15/2014 00:57 Performed at [facility]    Culture   Final           BLOOD CULTURE RECEIVED NO GROWTH TO DATE CULTURE WILL BE HELD FOR 5 DAYS BEFORE ISSUING A FINAL NEGATIVE REPORT Performed at [facility]    Report Status PENDING  Incomplete  Culture, blood (routine x 2)     Status: None (Preliminary  result)   Collection Time: 08/14/14  3:30 PM  Result Value Ref Range Status   Specimen Description BLOOD RIGHT HAND  Final   Special Requests BOTTLES DRAWN AEROBIC ONLY 4CC  Final   Culture  Setup Time   Final    08/15/2014 00:57 Performed at [facility]    Culture   Final           BLOOD CULTURE RECEIVED NO GROWTH TO DATE CULTURE WILL BE HELD FOR 5 DAYS BEFORE ISSUING A FINAL NEGATIVE REPORT Performed at [facility]    Report Status PENDING  Incomplete  Clostridium Difficile by PCR     Status: None   Collection Time: 08/15/14  2:12 PM  Result Value Ref Range Status   C difficile by pcr NEGATIVE NEGATIVE Final     Scheduled Meds: . [MAR Hold] albuterol  2.5 mg Nebulization Q6H  . [MAR Hold] amLODipine  2.5 mg Oral Daily  . [MAR Hold] atorvastatin  20 mg Oral Daily  . [MAR Hold] budesonide  0.25 mg Nebulization Q6H  . [MAR Hold] collagenase   Topical Daily  . [MAR Hold] heparin subcutaneous  5,000 Units Subcutaneous 3 times per day  . [MAR Hold] insulin aspart  0-15 Units Subcutaneous TID WC  . [MAR Hold] insulin glargine  15 Units Subcutaneous Daily  . [MAR Hold] morphine  15 mg Oral Q12H  . [MAR Hold] pantoprazole  40 mg Oral Q1200  . [MAR Hold] pregabalin  100 mg Oral TID  . [MAR Hold] sodium chloride  3 mL Intravenous Q12H  . [MAR Hold] vancomycin  750 mg Intravenous Q12H   Continuous Infusions: . sodium chloride 20 mL/hr at 08/16/14 1806    Time spent: 45 minutes  Marzetta Board, MD [facility] Pager [phone]. If 7 PM - 7 AM, please contact night-coverage at [email or web address], password [facility] 08/17/2014, 11:40 AM  LOS: 5 days

## 2014-08-17 NOTE — Plan of Care (Signed)
Problem: Phase I Progression Outcomes Goal: Pain controlled with appropriate interventions Outcome: Completed/Met Date Met:  08/17/14

## 2014-08-17 NOTE — Progress Notes (Signed)
Contacted ENDO to clarify schedule for patient today. Patient on schedule for 2:30pm TEE, and ENDO stated that patient can have clear liquids until 8:30am and sips with meds until procedure. Dr. Cruzita Lederer notified and verbal orders received okay for patient to have clears until 0830 and change NPO to sips with meds.

## 2014-08-17 NOTE — Progress Notes (Signed)
  Echocardiogram Echocardiogram Transesophageal has been performed.  Lysle Rubens 08/17/2014, 11:48 AM

## 2014-08-17 NOTE — H&P (View-Only) (Signed)
    [facility] has been requested to perform a transesophageal echocardiogram on 08/17/2014 for bacteremia.  After careful review of history and examination, the risks and benefits of transesophageal echocardiogram have been explained including risks of esophageal damage, perforation (1:10,000 risk), bleeding, pharyngeal hematoma as well as other potential complications associated with conscious sedation including aspiration, arrhythmia, respiratory failure and death. Alternatives to treatment were discussed, questions were answered. Patient is willing to proceed. Mother at bedside, all questions answered.  Patient does have some anemia, with hgb 7.6, however appear to be stable.  Almyra [name], PA-C 08/16/2014 6:01 PM

## 2014-08-17 NOTE — Progress Notes (Signed)
Jason [name] for Infectious Disease     Day # 5 vancomycin     Subjective: Pt has been hallucinating per Mom   Antibiotics:  Anti-infectives    Start     Dose/Rate Route Frequency Ordered Stop   08/15/14 1100  vancomycin (VANCOCIN) IVPB 750 mg/150 ml premix     750 mg150 mL/hr over 60 Minutes Intravenous Every 12 hours 08/15/14 1031     08/13/14 2000  vancomycin (VANCOCIN) IVPB 750 mg/150 ml premix  Status:  Discontinued     750 mg150 mL/hr over 60 Minutes Intravenous Every 24 hours 08/13/14 0235 08/13/14 0750   08/13/14 1400  aztreonam (AZACTAM) 1 g in dextrose 5 % 50 mL IVPB  Status:  Discontinued     1 g100 mL/hr over 30 Minutes Intravenous 3 times per day 08/13/14 0646 08/14/14 1254   08/13/14 1000  vancomycin (VANCOCIN) 500 mg in sodium chloride 0.9 % 100 mL IVPB  Status:  Discontinued     500 mg100 mL/hr over 60 Minutes Intravenous Every 12 hours 08/13/14 0750 08/15/14 1031   08/13/14 0800  levofloxacin (LEVAQUIN) IVPB 750 mg  Status:  Discontinued     750 mg100 mL/hr over 90 Minutes Intravenous Every 24 hours 08/13/14 0639 08/13/14 1108   08/13/14 0400  fluconazole (DIFLUCAN) IVPB 200 mg  Status:  Discontinued     200 mg100 mL/hr over 60 Minutes Intravenous Every 24 hours 08/13/14 0148 08/13/14 1108   08/13/14 0245  meropenem (MERREM) 1 g in sodium chloride 0.9 % 100 mL IVPB  Status:  Discontinued     1 g200 mL/hr over 30 Minutes Intravenous Every 12 hours 08/13/14 0234 08/13/14 0639   08/12/14 1945  vancomycin (VANCOCIN) IVPB 1000 mg/200 mL premix     1,000 mg200 mL/hr over 60 Minutes Intravenous  Once 08/12/14 1935 08/12/14 2140   08/12/14 1945  imipenem-cilastatin (PRIMAXIN) 500 mg in sodium chloride 0.9 % 100 mL IVPB     500 mg200 mL/hr over 30 Minutes Intravenous  Once 08/12/14 1937 08/12/14 2036      Medications: Scheduled Meds: . albuterol  2.5 mg Nebulization Q6H  . amLODipine  2.5 mg Oral Daily  . atorvastatin  20 mg Oral Daily  . budesonide  0.25 mg  Nebulization Q6H  . collagenase   Topical Daily  . feeding supplement (GLUCERNA SHAKE)  237 mL Oral TID BM  . heparin subcutaneous  5,000 Units Subcutaneous 3 times per day  . insulin aspart  0-15 Units Subcutaneous TID WC  . insulin glargine  15 Units Subcutaneous Daily  . morphine  15 mg Oral Q12H  . pantoprazole  40 mg Oral Q1200  . pregabalin  100 mg Oral TID  . sodium chloride  3 mL Intravenous Q12H  . vancomycin  750 mg Intravenous Q12H   Continuous Infusions:   PRN Meds:.acetaminophen, methocarbamol, ondansetron **OR** ondansetron (ZOFRAN) IV, oxyCODONE-acetaminophen, traZODone    Objective: Weight change:   Intake/Output Summary (Last 24 hours) at 08/17/14 1432 Last data filed at 08/17/14 1000  Gross per 24 hour  Intake      3 ml  Output   1950 ml  Net  -1947 ml   Blood pressure 93/53, pulse 87, temperature 97.8 F (36.6 C), temperature source Axillary, resp. rate 18, height 5\' 8"  (1.727 m), weight 129 lb 6.6 oz (58.7 kg), SpO2 91 %. Temp:  [97.8 F (36.6 C)-99.9 F (37.7 C)] 97.8 F (36.6 C) (11/10 1408) Pulse Rate:  [84-95] 87 (11/10  1408) Resp:  [14-24] 18 (11/10 1327) BP: (93-137)/(53-84) 93/53 mmHg (11/10 1327) SpO2:  [91 %-100 %] 91 % (11/10 1406)  Physical Exam: General: Alert and awake, oriented x3, not in any acute distress. HEENT: anicteric sclera,, EOMI CVS regular rate, normal r,  no murmur rubs or gallops Chest: clear to auscultation bilaterally, no wheezing, rales or rhonchi Abdomen: soft nontender, nondistended, Extremities: contractures: he has decubitus ulcers see pictures below, sacral one:  I did not examine decubiti today   CBC:  Recent Labs Lab 08/12/14 1840 08/13/14 0235 08/14/14 0329 08/15/14 0325  HGB 8.8* 8.5* 7.4* 7.6*  HCT 27.8* 26.8* 23.0* 23.1*  PLT 425* 420* 416* 477*     BMET  Recent Labs  08/15/14 0325 08/16/14 0259  NA 142 140  K 3.4* 4.1  CL 110 107  CO2 20 19  GLUCOSE 127* 181*  BUN 13 7  CREATININE  0.73 0.66  CALCIUM 9.6 9.6     Liver Panel  No results for input(s): PROT, ALBUMIN, AST, ALT, ALKPHOS, BILITOT, BILIDIR, IBILI in the last 72 hours.     Sedimentation Rate No results for input(s): ESRSEDRATE in the last 72 hours. C-Reactive Protein No results for input(s): CRP in the last 72 hours.  Micro Results: Recent Results (from the past 720 hour(s))  MRSA PCR Screening     Status: None   Collection Time: 07/18/14  8:10 PM  Result Value Ref Range Status   MRSA by PCR NEGATIVE NEGATIVE Final    Comment:        The GeneXpert MRSA Assay (FDA approved for NASAL specimens only), is one component of a comprehensive MRSA colonization surveillance program. It is not intended to diagnose MRSA infection nor to guide or monitor treatment for MRSA infections.  Culture, blood (routine x 2)     Status: None   Collection Time: 07/18/14  9:49 PM  Result Value Ref Range Status   Specimen Description BLOOD RIGHT ANTECUBITAL  Final   Special Requests   Final    BOTTLES DRAWN AEROBIC AND ANAEROBIC 10CC BLUE 5CC RED   Culture  Setup Time   Final    07/19/2014 02:38 Performed at [place]   Final    NO GROWTH 5 DAYS Performed at [facility]   Report Status 07/25/2014 FINAL  Final  Culture, blood (routine x 2)     Status: None   Collection Time: 07/18/14 10:04 PM  Result Value Ref Range Status   Specimen Description BLOOD RIGHT HAND  Final   Special Requests BOTTLES DRAWN AEROBIC ONLY 5CC  Final   Culture  Setup Time   Final    07/19/2014 02:38 Performed at [place]   Final    NO GROWTH 5 DAYS Performed at [facility]   Report Status 07/25/2014 FINAL  Final  Urine culture     Status: None   Collection Time: 07/18/14 10:29 PM  Result Value Ref Range Status   Specimen Description URINE, CATHETERIZED  Final   Special Requests NONE  Final   Culture  Setup Time   Final    07/18/2014 22:25 Performed at [place]   Final    55,000 COLONIES/ML Performed at [place] Performed at [facility]  Final   Report Status 07/20/2014 FINAL  Final  MRSA PCR Screening     Status: None   Collection Time:  07/30/14  6:35 AM  Result Value Ref Range Status   MRSA by PCR NEGATIVE NEGATIVE Final    Comment:        The GeneXpert MRSA Assay (FDA approved for NASAL specimens only), is one component of a comprehensive MRSA colonization surveillance program. It is not intended to diagnose MRSA infection nor to guide or monitor treatment for MRSA infections.  Blood Culture (routine x 2)     Status: None   Collection Time: 08/12/14  6:40 PM  Result Value Ref Range Status   Specimen Description BLOOD RIGHT [facility]  Final   Special Requests BOTTLES DRAWN AEROBIC AND ANAEROBIC 5CC EACH  Final   Culture  Setup Time   Final    08/13/2014 01:00 Performed at [facility]    Culture   Final    STAPHYLOCOCCUS AUREUS Note: SUSCEPTIBILITIES PERFORMED ON PREVIOUS CULTURE WITHIN THE LAST 5 DAYS. Note: Gram Stain Report Called to,Read Back By and Verified With: [facility] WORK RN [phone] Performed at [facility]    Report Status 08/16/2014 FINAL  Final  Urine culture     Status: None   Collection Time: 08/12/14  6:40 PM  Result Value Ref Range Status   Specimen Description URINE, CATHETERIZED  Final   Special Requests NONE  Final   Culture  Setup Time   Final    08/13/2014 00:43 Performed at [place]   Final    >=100,000 COLONIES/ML Performed at [facility]    Culture   Final    KLEBSIELLA PNEUMONIAE Performed at [facility]    Report Status 08/16/2014 FINAL  Final   Organism ID, Bacteria KLEBSIELLA PNEUMONIAE  Final      Susceptibility   Klebsiella pneumoniae - MIC*    AMPICILLIN >=32 RESISTANT Resistant     CEFAZOLIN <=4 SENSITIVE Sensitive     CEFTRIAXONE <=1 SENSITIVE Sensitive      CIPROFLOXACIN 0.5 SENSITIVE Sensitive     GENTAMICIN <=1 SENSITIVE Sensitive     LEVOFLOXACIN 2 SENSITIVE Sensitive     NITROFURANTOIN 64 INTERMEDIATE Intermediate     TOBRAMYCIN <=1 SENSITIVE Sensitive     TRIMETH/SULFA <=20 SENSITIVE Sensitive     PIP/TAZO 32 INTERMEDIATE Intermediate     * KLEBSIELLA PNEUMONIAE  Blood Culture (routine x 2)     Status: None   Collection Time: 08/12/14  6:48 PM  Result Value Ref Range Status   Specimen Description BLOOD RIGHT W[place]  Final   Special Requests BOTTLES DRAWN AEROBIC AND ANAEROBIC 10CC EACH  Final   Culture  Setup Time   Final    08/13/2014 00:59 Performed at [facility]    Culture   Final    STAPHYLOCOCCUS AUREUS Note: RIFAMPIN AND GENTAMICIN SHOULD NOT BE USED AS SINGLE DRUGS FOR TREATMENT OF STAPH INFECTIONS. ENTEROCOCCUS SPECIES Note: COMBINATION THERAPY OF HIGH DOSE AMPICILLIN OR VANCOMYCIN, PLUS AN AMINOGLYCOSIDE, IS USUALLY INDICATED FOR SERIOUS ENTEROCOCCAL INFECTIONS. Note: Gram Stain Report Called to,Read Back By and Verified With: Paula Compton RN 700P    Report Status 08/16/2014 FINAL  Final   Organism ID, Bacteria STAPHYLOCOCCUS AUREUS  Final   Organism ID, Bacteria ENTEROCOCCUS SPECIES  Final      Susceptibility   Staphylococcus aureus - MIC*    CLINDAMYCIN >=8 RESISTANT Resistant     ERYTHROMYCIN >=8 RESISTANT Resistant     GENTAMICIN <=0.5 SENSITIVE Sensitive     LEVOFLOXACIN 0.25 SENSITIVE Sensitive     OXACILLIN  1 SENSITIVE Sensitive     PENICILLIN >=0.5 RESISTANT Resistant     RIFAMPIN <=0.5 SENSITIVE Sensitive     TRIMETH/SULFA 20 SENSITIVE Sensitive     VANCOMYCIN <=0.5 SENSITIVE Sensitive     TETRACYCLINE >=16 RESISTANT Resistant     MOXIFLOXACIN* <=0.25 SENSITIVE Sensitive      * SET UP TIME:  [id number]    * STAPHYLOCOCCUS AUREUS   Enterococcus species - MIC*    AMPICILLIN <=2 SENSITIVE Sensitive     VANCOMYCIN 1 SENSITIVE Sensitive     * ENTEROCOCCUS SPECIES  MRSA PCR Screening     Status:  None   Collection Time: 08/13/14 12:43 AM  Result Value Ref Range Status   MRSA by PCR NEGATIVE NEGATIVE Final    Comment:        The GeneXpert MRSA Assay (FDA approved for NASAL specimens only), is one component of a comprehensive MRSA colonization surveillance program. It is not intended to diagnose MRSA infection nor to guide or monitor treatment for MRSA infections.   Culture, respiratory (NON-Expectorated)     Status: None   Collection Time: 08/13/14 11:52 AM  Result Value Ref Range Status   Specimen Description TRACHEAL ASPIRATE  Final   Special Requests NONE  Final   Gram Stain   Final    FEW WBC PRESENT,BOTH PMN AND MONONUCLEAR RARE SQUAMOUS EPITHELIAL CELLS PRESENT FEW GRAM POSITIVE COCCI IN PAIRS IN CLUSTERS RARE GRAM NEGATIVE RODS Performed at [facility]    Culture   Final    ABUNDANT STAPHYLOCOCCUS AUREUS Note: RIFAMPIN AND GENTAMICIN SHOULD NOT BE USED AS SINGLE DRUGS FOR TREATMENT OF STAPH INFECTIONS. Performed at [facility]    Report Status 08/16/2014 FINAL  Final   Organism ID, Bacteria STAPHYLOCOCCUS AUREUS  Final      Susceptibility   Staphylococcus aureus - MIC*    CLINDAMYCIN >=8 RESISTANT Resistant     ERYTHROMYCIN >=8 RESISTANT Resistant     GENTAMICIN <=0.5 SENSITIVE Sensitive     LEVOFLOXACIN 0.25 SENSITIVE Sensitive     OXACILLIN 1 SENSITIVE Sensitive     PENICILLIN >=0.5 RESISTANT Resistant     RIFAMPIN <=0.5 SENSITIVE Sensitive     TRIMETH/SULFA <=10 SENSITIVE Sensitive     VANCOMYCIN 1 SENSITIVE Sensitive     TETRACYCLINE >=16 RESISTANT Resistant     MOXIFLOXACIN <=0.25 SENSITIVE Sensitive     * ABUNDANT STAPHYLOCOCCUS AUREUS  Culture, blood (routine x 2)     Status: None (Preliminary result)   Collection Time: 08/14/14  3:20 PM  Result Value Ref Range Status   Specimen Description BLOOD RIGHT ARM  Final   Special Requests BOTTLES DRAWN AEROBIC ONLY [place]  Final   Culture  Setup Time   Final    08/15/2014  00:57 Performed at [facility]    Culture   Final           BLOOD CULTURE RECEIVED NO GROWTH TO DATE CULTURE WILL BE HELD FOR 5 DAYS BEFORE ISSUING A FINAL NEGATIVE REPORT Performed at [facility]    Report Status PENDING  Incomplete  Culture, blood (routine x 2)     Status: None (Preliminary result)   Collection Time: 08/14/14  3:30 PM  Result Value Ref Range Status   Specimen Description BLOOD RIGHT HAND  Final   Special Requests BOTTLES DRAWN AEROBIC ONLY 4CC  Final   Culture  Setup Time   Final    08/15/2014 00:57 Performed at [facility]  Culture   Final           BLOOD CULTURE RECEIVED NO GROWTH TO DATE CULTURE WILL BE HELD FOR 5 DAYS BEFORE ISSUING A FINAL NEGATIVE REPORT Performed at [facility]    Report Status PENDING  Incomplete  Clostridium Difficile by PCR     Status: None   Collection Time: 08/15/14  2:12 PM  Result Value Ref Range Status   C difficile by pcr NEGATIVE NEGATIVE Final    Studies/Results: No results found.    Assessment/Plan:  Principal Problem:   Staphylococcus aureus bacteremia Active Problems:   Type 1 diabetes, uncontrolled, with neuropathy   Asthma   GERD   Spinal cord infarction (history of)   Acute encephalopathy   Pneumonia   Sepsis   Acute respiratory failure with hypercapnia   UTI (lower urinary tract infection)   Healthcare-associated pneumonia   Renal failure (ARF), acute on chronic   Sacral pressure sore   Severe sepsis    Jason [name] is a 28 y.o. male with  Hx of paraplegia spinal cord infarct with MSSA and Enterococcus (latter in only 1/2 blood cultures) without clear source other than open wounds   #1 Staphylococcus Aureus bacteremia       [place] [facility] Staphylococcus aureus bacteremia   Staphylococcus aureus bacteremia (SAB) is associated with a high rate of complications and mortality.  Specific aspects of clinical management are critical  to optimizing the outcome of patients with SAB.  Therefore, the [facility] Antimicrobial Management Team [facility]) has initiated an intervention aimed at improving the management of SAB at [facility].  To do so, Infectious Diseases physicians are providing an evidence-based consult for the management of all patients with SAB.     Yes No Comments  Perform follow-up blood cultures (even if the patient is afebrile) to ensure clearance of bacteremia [x]  []  08/14/14 BLOOD CULTURES COOKING NGTD  Remove vascular catheter and obtain follow-up blood cultures after the removal of the catheter []  []  DO NOT PLACE PICC UNTIL WE ARE 3-4 DAYS OUT FROM NEGATIVE BLOOD CULTURES  Perform echocardiography to evaluate for endocarditis (transthoracic ECHO is 40-50% sensitive, TEE is > 90% sensitive) []  []  Please keep in mind, that neither test can definitively EXCLUDE endocarditis, and that should clinical suspicion remain high for endocarditis the patient should then still be treated with an "endocarditis" duration of therapy = 6 weeks  TEE NEGATIVE FOR VEGETATIONS  Consult electrophysiologist to evaluate implanted cardiac device (pacemaker, ICD) []  []  NA  Ensure source control []  []  Have all abscesses been drained effectively? Have deep seeded infections (septic joints or osteomyelitis) had appropriate surgical debridement?  MRI RIGHT hip and pelvis  Investigate for "metastatic" sites of infection []  []  Does the patient have ANY symptom or physical exam finding that would suggest a deeper infection (back or neck pain that may be suggestive of vertebral osteomyelitis or epidural abscess, muscle pain that could be a symptom of pyomyositis)?  Keep in mind that for deep seeded infections MRI imaging with contrast is preferred rather than other often insensitive tests such as plain x-rays, especially early in a patient's presentation. SEE ABOVE DISCUSSION and fine to MRI brain given mother's concern re his confusion  Change  antibiotic therapy to [place] per Mom he has anaphylaxis []  []  Beta-lactam antibiotics are preferred for MSSA due to higher cure rates.   If on Vancomycin, goal trough should be 15 - 20 mcg/mL  Estimated duration of IV antibiotic therapy:   NEED  MORE AGGRESSIVE IMAGING, I WOULD STILL LIKELY GO WITH MINIMUM OF 4 WEEKS  []  []  Consult case management for probably prolonged outpatient IV antibiotic therapy   #2 Enterococcus growing from 1/2 cultures on admission, could be contaminant,   #3 Delirium: suspect is related to meds. But I think MRI brain prudent to rule out Brain abscesses.   LOS: 5 days   [name] 08/17/2014, 2:32 PM

## 2014-08-17 NOTE — CV Procedure (Signed)
     Transesophageal Echocardiogram Note  Jason [name] [id number] [birth year]  Procedure: Transesophageal Echocardiogram Indications: Staphylococcus bacteremia  Procedure Details Consent: Obtained Time Out: Verified patient identification, verified procedure, site/side was marked, verified correct patient position, special equipment/implants available, Radiology Safety Procedures followed,  medications/allergies/relevent history reviewed, required imaging and test results available.  Performed  Medications: Fentanyl: 50 mcg Versed: 4 mg  Left Ventrical:  Normal LVEF 55-60%, no regional wall motion abnormalities.  Mitral Valve: Normal, mild MR, no vegetation.   Aortic Valve: Normal, no AI, no vegetation.   Tricuspid Valve: Normal, no TR, no vegetation.   Pulmonic Valve: Normal, trace PR, no vegetation.   Left Atrium/ Left atrial appendage: Normal, no thrombus.  Atrial septum: Normal, no PFO or ASD.   Aorta: No plaque.   Complications: No apparent complications Patient did tolerate procedure well.  Dorothy Spark, MD, [facility] 08/17/2014, 7:56 AM  Left ventricle: The cavity size was normal. Wall thickness was normal. Systolic function was normal. The estimated ejection fraction was in the range of 50% to 55%. Wall motion was normal; there were no regional wall motion abnormalities. Left ventricular diastolic function parameters were normal. - Mitral valve: There was trivial regurgitation. - Right atrium: Central venous pressure (est): 3 mm Hg. - Atrial septum: No defect or patent foramen ovale was identified. - Tricuspid valve: There was trivial regurgitation. - Pulmonary arteries: Systolic pressure could not be accurately estimated. - Pericardium, extracardiac: There was no pericardial effusion.  Impressions:  - Normal LV wall thickness and chamber size with LVEF 50-55%, Trivial mitral and tricuspid regurgitation. No obvious  valvular vegetations.

## 2014-08-17 NOTE — Interval H&P Note (Signed)
History and Physical Interval Note:  08/17/2014 7:56 AM  [place]  has presented today for surgery, with the diagnosis of positive blood cultures  The various methods of treatment have been discussed with the patient and family. After consideration of risks, benefits and other options for treatment, the patient has consented to  Procedure(s): TRANSESOPHAGEAL ECHOCARDIOGRAM (TEE) (N/A) as a surgical intervention .  The patient's history has been reviewed, patient examined, no change in status, stable for surgery.  I have reviewed the patient's chart and labs.  Questions were answered to the patient's satisfaction.     Dorothy Spark

## 2014-08-18 ENCOUNTER — Encounter: Payer: Medicaid Other | Admitting: Physical Medicine & Rehabilitation

## 2014-08-18 ENCOUNTER — Encounter (HOSPITAL_COMMUNITY): Payer: Self-pay | Admitting: Cardiology

## 2014-08-18 DIAGNOSIS — M869 Osteomyelitis, unspecified: Secondary | ICD-10-CM | POA: Insufficient documentation

## 2014-08-18 DIAGNOSIS — G9519 Other vascular myelopathies: Secondary | ICD-10-CM

## 2014-08-18 DIAGNOSIS — L8994 Pressure ulcer of unspecified site, stage 4: Secondary | ICD-10-CM | POA: Insufficient documentation

## 2014-08-18 LAB — GLUCOSE, CAPILLARY
GLUCOSE-CAPILLARY: 203 mg/dL — AB (ref 70–99)
Glucose-Capillary: 202 mg/dL — ABNORMAL HIGH (ref 70–99)
Glucose-Capillary: 123 mg/dL — ABNORMAL HIGH (ref 70–99)
Glucose-Capillary: 209 mg/dL — ABNORMAL HIGH (ref 70–99)

## 2014-08-18 LAB — COMPREHENSIVE METABOLIC PANEL
ALT: 6 U/L (ref 0–53)
ANION GAP: 13 (ref 5–15)
AST: 8 U/L (ref 0–37)
Albumin: 2.2 g/dL — ABNORMAL LOW (ref 3.5–5.2)
Alkaline Phosphatase: 125 U/L — ABNORMAL HIGH (ref 39–117)
BUN: 9 mg/dL (ref 6–23)
CALCIUM: 8.7 mg/dL (ref 8.4–10.5)
CO2: 22 meq/L (ref 19–32)
Chloride: 101 mEq/L (ref 96–112)
Creatinine, Ser: 0.67 mg/dL (ref 0.50–1.35)
GLUCOSE: 143 mg/dL — AB (ref 70–99)
Potassium: 3.5 mEq/L — ABNORMAL LOW (ref 3.7–5.3)
SODIUM: 136 meq/L — AB (ref 137–147)
TOTAL PROTEIN: 6.9 g/dL (ref 6.0–8.3)
Total Bilirubin: 0.3 mg/dL (ref 0.3–1.2)

## 2014-08-18 LAB — CBC
HCT: 23.7 % — ABNORMAL LOW (ref 39.0–52.0)
Hemoglobin: 7.8 g/dL — ABNORMAL LOW (ref 13.0–17.0)
MCH: 25.4 pg — ABNORMAL LOW (ref 26.0–34.0)
MCHC: 32.9 g/dL (ref 30.0–36.0)
MCV: 77.2 fL — ABNORMAL LOW (ref 78.0–100.0)
Platelets: 501 10*3/uL — ABNORMAL HIGH (ref 150–400)
RBC: 3.07 MIL/uL — AB (ref 4.22–5.81)
RDW: 15.5 % (ref 11.5–15.5)
WBC: 24.9 10*3/uL — ABNORMAL HIGH (ref 4.0–10.5)

## 2014-08-18 LAB — VANCOMYCIN, TROUGH: Vancomycin Tr: 20.6 ug/mL — ABNORMAL HIGH (ref 10.0–20.0)

## 2014-08-18 LAB — HIV ANTIBODY (ROUTINE TESTING W REFLEX): HIV: NONREACTIVE

## 2014-08-18 LAB — VITAMIN B12: Vitamin B-12: 1390 pg/mL — ABNORMAL HIGH (ref 211–911)

## 2014-08-18 LAB — AMMONIA: Ammonia: 39 umol/L (ref 11–60)

## 2014-08-18 LAB — TSH: TSH: 2.09 u[IU]/mL (ref 0.350–4.500)

## 2014-08-18 NOTE — Progress Notes (Addendum)
PROGRESS NOTE  Jason [name] TMH:[id number] DOB: 1986-01-07 DOA: 08/12/2014 PCP: [name], MD  Assessment/Plan: MSSA bacteremia  - he had a TEE today without vegetations. Continue vancomycin. - Infectious diseases consulted, appreciate input -08/14/2014 surveillance blood cultures negative -suspected source is right hip decubitus ulcer  Right hip decubitus ulcer -MRI right hip reveals complex fluid collection -Consulted general surgery for opinion  Acute hypoxic respiratory failure - status post intubation, now on nasal cannula - Continue nebulizers with albuterol and Pulmicort -presently stable on 3 L nasal cannula -Repeat chest x-ray as the patient appears to be more dyspneic today  Acute encephalopathy -Multifactorial including the patient's sepsis/infectious process, opioids, hypoxemia -MR brain--new edema signal in the splenium of corpus callosum--> May be related to the patient's acute respiratory failure -Consult to neurology for opinion--spoke with Dr. 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 No acute infiltrate or pleural effusion. No pulmonary edema. Stable old fracture  deformity distal left clavicle.  IMPRESSION: No active cardiopulmonary disease.   Electronically Signed   By: Lahoma Crocker M.D.   On: 07/29/2014 17:54   Ct Head Wo Contrast  07/29/2014   CLINICAL DATA:  Hypertension.  Previous stroke.  EXAM: CT HEAD WITHOUT CONTRAST  TECHNIQUE: Contiguous axial images were obtained from the base of the skull through the vertex without intravenous contrast.  COMPARISON:  04/27/2014.  Brain MR dated 02/04/2014.  FINDINGS: Previously noted Chiari 1 malformation. Otherwise, normal appearing cerebral hemispheres and posterior fossa structures. Normal size and position of the ventricles. No intracranial hemorrhage, mass lesion or CT evidence of acute infarction. Unremarkable bones and included paranasal sinuses.  IMPRESSION: No acute abnormality.  Previously noted Chiari 1 malformation.   Electronically Signed   By: Enrique Sack M.D.   On: 07/29/2014 20:57   Mr Jeri Cos XQ Contrast  08/17/2014   CLINICAL DATA:  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 Confusion with fevers and sepsis. Assess for CNS infection.  EXAM: MRI HEAD WITHOUT AND WITH CONTRAST  TECHNIQUE: Multiplanar, multiecho pulse sequences of the brain and surrounding structures were obtained without and with intravenous contrast.  CONTRAST:  10mL MULTIHANCE GADOBENATE DIMEGLUMINE 529 MG/ML IV SOLN  COMPARISON:  Head CT 07/29/2014.  MRI 01/29/2014.  FINDINGS: The brainstem and cerebellum are normal. The cerebral hemispheres again show a few foci of chronic T2 signal within the white matter, possibly related to old shear injuries. Newly seen is edema in the splenium of the corpus callosum, more on the left than the right. This is associated with low level restricted diffusion. There is no contrast enhancement. No other active pathologic finding. After contrast administration, there is no abnormal enhancement of the brain or leptomeninges, including of the  splenium of the corpus callosum  No evidence of mass lesion,  hemorrhage, hydrocephalus or extra-axial collection. No pituitary mass. Sinuses are clear.  IMPRESSION: No evidence of intracranial abscess. The patient does have the new finding of edema signal within the splenium of the corpus callosum. The differential diagnosis includes the presenting lesion of multiple sclerosis, demyelination secondary to vitamin B12 deficiency (Marchiafava-Bignami disease), infarction due to hypoxia, and reversible lesions of the splenium associated with various infectious agents including influenza, rotavirus, E coli, and adenovirus. Other transient lesions can be caused by seizure, posterior reversible encephalopathy and hypoglycemia.   Electronically Signed   By: Nelson Chimes M.D.   On: 08/17/2014 19:26   Mr Hip Right W Wo Contrast  08/17/2014   CLINICAL DATA:  Quadriplegic, status post cord infarct, evaluate for osteomyelitis of the right hip from decubitus ulcer  EXAM: MRI PELVIS WITHOUT AND WITH CONTRAST  TECHNIQUE: Multiplanar multisequence MR imaging of the pelvis was performed both before and after administration of intravenous contrast.  CONTRAST:  72mL MULTIHANCE GADOBENATE DIMEGLUMINE 529 MG/ML IV SOLN  COMPARISON:  None.  FINDINGS: There is no focal marrow signal abnormality of bilateral hips. There is no fracture or dislocation. There is no avascular necrosis. Bilateral sacroiliac joints are normal. There is no joint effusion. There is no gross labral or paralabral abnormality.  There is a decubitus ulcer inferior to the right ischial tuberosity with a small amount air within the ulcer versus packing material. There is a mildly complex fluid collection extending from the decubitus ulcer to the posterior aspect of the right inferior pubic ramus measuring 1.8 x 1.6 x 3.7 cm. There is no definite cortical destruction. There is mild increased T2 signal within the posterior aspect of the right inferior pubic ramus just medial to the ischial tuberosity, which may be reactive  versus early osteomyelitis.  There is diffuse mild increased T2 signal throughout the proximal thigh and pelvic musculature which is likely neurogenic.  There is severe bladder wall thickening with a Foley catheter present. The bladder wall thickening is likely secondary to neurogenic bladder.  IMPRESSION: 1. There is a decubitus ulcer inferior to the right ischial tuberosity with a small amount air within the ulcer versus packing material. There is a mildly complex fluid collection extending from the decubitus ulcer to the posterior aspect of the right inferior pubic ramus measuring 1.8 x 1.6 x 3.7 cm without definite cortical destruction. There is mild increased T2 signal within the posterior aspect of the right inferior pubic ramus just medial to the ischial tuberosity, which may be reactive versus early osteomyelitis.   Electronically Signed   By: Kathreen Devoid   On: 08/17/2014 20:36   Dg Chest Port 1 View  08/14/2014   CLINICAL DATA:  Respiratory failure.  EXAM: PORTABLE CHEST - 1 VIEW  COMPARISON:  08/13/2014  FINDINGS: Endotracheal tube tip between the clavicular heads and carina. Gastric suction tube reaches the stomach.  Airspace disease in the left lower lobe is again noted, still extensive but less dense. Small left pleural effusion. Medial right basilar and right upper lobe (near the minor fissure) airspace disease again seen. No evidence of cavitation or pneumothorax.  IMPRESSION: 1. Bilateral pneumonia and small left pleural effusion. Aeration in the left lower lobe has improved since yesterday. 2. Endotracheal and orogastric tubes remain in good position.   Electronically Signed   By: Jorje Guild M.D.   On: 08/14/2014 07:47   Dg Chest Port 1 View  08/13/2014  CLINICAL DATA:  Intubation.  EXAM: PORTABLE CHEST - 1 VIEW  COMPARISON:  08/12/2014  FINDINGS: Interval placement of endotracheal tube with tip measuring 4.9 cm above the carinal. Enteric tube was placed. Tip is off the field of view  but below the left hemidiaphragm. Heart size and pulmonary vascularity are normal. There is developing small left pleural effusion with consolidation in the left lung base suggesting pneumonia.  IMPRESSION: Appliances appear in satisfactory location. Increasing consolidation and effusion in the left lung base.   Electronically Signed   By: Lucienne Capers M.D.   On: 08/13/2014 06:11   Dg Chest Port 1 View  08/12/2014   CLINICAL DATA:  Fever R50.9 (ICD-10-CM). Altered mental status. Recent dental surgery.  EXAM: PORTABLE CHEST - 1 VIEW  COMPARISON:  07/29/2014  FINDINGS: New subtle densities at the left lung base. Heart size is normal. Trachea is midline. Negative for a pneumothorax.  IMPRESSION: Subtle densities at the left lung base. Findings most likely represent atelectasis.   Electronically Signed   By: Markus Daft M.D.   On: 08/12/2014 19:08         Subjective: Patient is alert and answers questions appropriately, but falls asleep without further arousal or communication. Denies any chest pain, shortness breath, nausea, vomiting, abdominal pain.  Objective: Filed Vitals:   08/18/14 0930 08/18/14 1140 08/18/14 1330 08/18/14 1333  BP:  133/76 99/56   Pulse:   98   Temp:   99.5 F (37.5 C)   TempSrc:   Oral   Resp:      Height:      Weight:      SpO2: 100%  83% 100%    Intake/Output Summary (Last 24 hours) at 08/18/14 1349 Last data filed at 08/18/14 0900  Gross per 24 hour  Intake   1641 ml  Output   1500 ml  Net    141 ml   Weight change:  Exam:   General:  Pt is alert, follows commands appropriately, not in acute distress  HEENT: No icterus, No thrush, No meningismus, [place]/AT  Cardiovascular: RRR, S1/S2, no rubs, no gallops  Respiratory: bibasilar crackles. No wheezing  Abdomen: Soft/+BS, non tender, non distended, no guarding  Extremities: No edema, No lymphangitis, No petechiae, No rashes, no synovitis  Data Reviewed: Basic Metabolic Panel:  Recent Labs Lab  08/13/14 0235 08/14/14 0329 08/15/14 0325 08/16/14 0259 08/18/14 0520  NA 138 139 142 140 136*  K 4.3 3.5* 3.4* 4.1 3.5*  CL 104 106 110 107 101  CO2 18* 17* 20 19 22   GLUCOSE 271* 264* 127* 181* 143*  BUN 34* 29* 13 7 9   CREATININE 1.60* 1.22 0.73 0.66 0.67  CALCIUM 9.4 9.5 9.6 9.6 8.7   Liver Function Tests:  Recent Labs Lab 08/12/14 1840 08/14/14 0329 08/18/14 0520  AST 10 10 8   ALT 7 7 6   ALKPHOS 169* 153* 125*  BILITOT 0.3 0.2* 0.3  PROT 7.9 6.6 6.9  ALBUMIN 2.7* 2.0* 2.2*    Recent Labs Lab 08/12/14 1840  LIPASE 8*   No results for input(s): AMMONIA in the last 168 hours. CBC:  Recent Labs Lab 08/12/14 1840 08/13/14 0235 08/14/14 0329 08/15/14 0325 08/18/14 0520  WBC 23.8* 30.0* 29.1* 28.6* 24.9*  NEUTROABS 20.6*  --   --   --   --   HGB 8.8* 8.5* 7.4* 7.6* 7.8*  HCT 27.8* 26.8* 23.0* 23.1* 23.7*  MCV 81.8 81.5 78.5 76.7* 77.2*  PLT 425* 420* 416* 477*  501*   Cardiac Enzymes: No results for input(s): CKTOTAL, CKMB, CKMBINDEX, TROPONINI in the last 168 hours. BNP: Invalid input(s): POCBNP CBG:  Recent Labs Lab 08/17/14 1206 08/17/14 1810 08/17/14 2216 08/18/14 0755 08/18/14 1139  GLUCAP 135* 154* 105* 202* 123*    Recent Results (from the past 240 hour(s))  Blood Culture (routine x 2)     Status: None   Collection Time: 08/12/14  6:40 PM  Result Value Ref Range Status   Specimen Description BLOOD RIGHT [facility]  Final   Special Requests BOTTLES DRAWN AEROBIC AND ANAEROBIC 5CC EACH  Final   Culture  Setup Time   Final    08/13/2014 01:00 Performed at [facility]    Culture   Final    STAPHYLOCOCCUS AUREUS Note: SUSCEPTIBILITIES PERFORMED ON PREVIOUS CULTURE WITHIN THE LAST 5 DAYS. Note: Gram Stain Report Called to,Read Back By and Verified With: [facility] WORK RN [phone] Performed at [facility]    Report Status 08/16/2014 FINAL  Final  Urine culture     Status: None   Collection Time: 08/12/14  6:40 PM  Result Value  Ref Range Status   Specimen Description URINE, CATHETERIZED  Final   Special Requests NONE  Final   Culture  Setup Time   Final    08/13/2014 00:43 Performed at [facility]   Final    >=100,000 COLONIES/ML Performed at [facility]    Culture   Final    KLEBSIELLA PNEUMONIAE Performed at [facility]    Report Status 08/16/2014 FINAL  Final   Organism ID, Bacteria KLEBSIELLA PNEUMONIAE  Final      Susceptibility   Klebsiella pneumoniae - MIC*    AMPICILLIN >=32 RESISTANT Resistant     CEFAZOLIN <=4 SENSITIVE Sensitive     CEFTRIAXONE <=1 SENSITIVE Sensitive     CIPROFLOXACIN 0.5 SENSITIVE Sensitive     GENTAMICIN <=1 SENSITIVE Sensitive     LEVOFLOXACIN 2 SENSITIVE Sensitive     NITROFURANTOIN 64 INTERMEDIATE Intermediate     TOBRAMYCIN <=1 SENSITIVE Sensitive     TRIMETH/SULFA <=20 SENSITIVE Sensitive     PIP/TAZO 32 INTERMEDIATE Intermediate     * KLEBSIELLA PNEUMONIAE  Blood Culture (routine x 2)     Status: None   Collection Time: 08/12/14  6:48 PM  Result Value Ref Range Status   Specimen Description BLOOD RIGHT WRIST  Final   Special Requests BOTTLES DRAWN AEROBIC AND ANAEROBIC 10CC EACH  Final   Culture  Setup Time   Final    08/13/2014 00:59 Performed at [facility]    Culture   Final    STAPHYLOCOCCUS AUREUS Note: RIFAMPIN AND GENTAMICIN SHOULD NOT BE USED AS SINGLE DRUGS FOR TREATMENT OF STAPH INFECTIONS. ENTEROCOCCUS SPECIES Note: COMBINATION THERAPY OF HIGH DOSE AMPICILLIN OR VANCOMYCIN, PLUS AN AMINOGLYCOSIDE, IS USUALLY INDICATED FOR SERIOUS ENTEROCOCCAL INFECTIONS. Note: Gram Stain Report Called to,Read Back By and Verified With: Paula Compton RN 700P    Report Status 08/16/2014 FINAL  Final   Organism ID, Bacteria STAPHYLOCOCCUS AUREUS  Final   Organism ID, Bacteria ENTEROCOCCUS SPECIES  Final      Susceptibility   Staphylococcus aureus - MIC*    CLINDAMYCIN >=8 RESISTANT Resistant     ERYTHROMYCIN  >=8 RESISTANT Resistant     GENTAMICIN <=0.5 SENSITIVE Sensitive     LEVOFLOXACIN 0.25 SENSITIVE Sensitive     OXACILLIN 1 SENSITIVE Sensitive     PENICILLIN >=0.5 RESISTANT Resistant  RIFAMPIN <=0.5 SENSITIVE Sensitive     TRIMETH/SULFA 20 SENSITIVE Sensitive     VANCOMYCIN <=0.5 SENSITIVE Sensitive     TETRACYCLINE >=16 RESISTANT Resistant     MOXIFLOXACIN* <=0.25 SENSITIVE Sensitive      * SET UP TIME:  [id number]    * STAPHYLOCOCCUS AUREUS   Enterococcus species - MIC*    AMPICILLIN <=2 SENSITIVE Sensitive     VANCOMYCIN 1 SENSITIVE Sensitive     * ENTEROCOCCUS SPECIES  MRSA PCR Screening     Status: None   Collection Time: 08/13/14 12:43 AM  Result Value Ref Range Status   MRSA by PCR NEGATIVE NEGATIVE Final    Comment:        The GeneXpert MRSA Assay (FDA approved for NASAL specimens only), is one component of a comprehensive MRSA colonization surveillance program. It is not intended to diagnose MRSA infection nor to guide or monitor treatment for MRSA infections.   Culture, respiratory (NON-Expectorated)     Status: None   Collection Time: 08/13/14 11:52 AM  Result Value Ref Range Status   Specimen Description TRACHEAL ASPIRATE  Final   Special Requests NONE  Final   Gram Stain   Final    FEW WBC PRESENT,BOTH PMN AND MONONUCLEAR RARE SQUAMOUS EPITHELIAL CELLS PRESENT FEW GRAM POSITIVE COCCI IN PAIRS IN CLUSTERS RARE GRAM NEGATIVE RODS Performed at [facility]    Culture   Final    ABUNDANT STAPHYLOCOCCUS AUREUS Note: RIFAMPIN AND GENTAMICIN SHOULD NOT BE USED AS SINGLE DRUGS FOR TREATMENT OF STAPH INFECTIONS. Performed at [facility]    Report Status 08/16/2014 FINAL  Final   Organism ID, Bacteria STAPHYLOCOCCUS AUREUS  Final      Susceptibility   Staphylococcus aureus - MIC*    CLINDAMYCIN >=8 RESISTANT Resistant     ERYTHROMYCIN >=8 RESISTANT Resistant     GENTAMICIN <=0.5 SENSITIVE Sensitive     LEVOFLOXACIN 0.25 SENSITIVE  Sensitive     OXACILLIN 1 SENSITIVE Sensitive     PENICILLIN >=0.5 RESISTANT Resistant     RIFAMPIN <=0.5 SENSITIVE Sensitive     TRIMETH/SULFA <=10 SENSITIVE Sensitive     VANCOMYCIN 1 SENSITIVE Sensitive     TETRACYCLINE >=16 RESISTANT Resistant     MOXIFLOXACIN <=0.25 SENSITIVE Sensitive     * ABUNDANT STAPHYLOCOCCUS AUREUS  Culture, blood (routine x 2)     Status: None (Preliminary result)   Collection Time: 08/14/14  3:20 PM  Result Value Ref Range Status   Specimen Description BLOOD RIGHT ARM  Final   Special Requests BOTTLES DRAWN AEROBIC ONLY [place]  Final   Culture  Setup Time   Final    08/15/2014 00:57 Performed at [facility]    Culture   Final           BLOOD CULTURE RECEIVED NO GROWTH TO DATE CULTURE WILL BE HELD FOR 5 DAYS BEFORE ISSUING A FINAL NEGATIVE REPORT Performed at [facility]    Report Status PENDING  Incomplete  Culture, blood (routine x 2)     Status: None (Preliminary result)   Collection Time: 08/14/14  3:30 PM  Result Value Ref Range Status   Specimen Description BLOOD RIGHT HAND  Final   Special Requests BOTTLES DRAWN AEROBIC ONLY 4CC  Final   Culture  Setup Time   Final    08/15/2014 00:57 Performed at [facility]    Culture   Final           BLOOD CULTURE  RECEIVED NO GROWTH TO DATE CULTURE WILL BE HELD FOR 5 DAYS BEFORE ISSUING A FINAL NEGATIVE REPORT Performed at [facility]    Report Status PENDING  Incomplete  Clostridium Difficile by PCR     Status: None   Collection Time: 08/15/14  2:12 PM  Result Value Ref Range Status   C difficile by pcr NEGATIVE NEGATIVE Final     Scheduled Meds: . albuterol  2.5 mg Nebulization TID  . amLODipine  2.5 mg Oral Daily  . atorvastatin  20 mg Oral Daily  . budesonide  0.25 mg Nebulization Q6H  . collagenase   Topical Daily  . feeding supplement (GLUCERNA SHAKE)  237 mL Oral TID BM  . feeding supplement (PRO-STAT SUGAR FREE 64)  30 mL Oral Daily  . heparin  subcutaneous  5,000 Units Subcutaneous 3 times per day  . insulin aspart  0-15 Units Subcutaneous TID WC  . insulin glargine  15 Units Subcutaneous Daily  . morphine  15 mg Oral Q12H  . pantoprazole  40 mg Oral Q1200  . pregabalin  100 mg Oral TID  . sodium chloride  3 mL Intravenous Q12H  . vancomycin  750 mg Intravenous Q12H   Continuous Infusions:    Larenda Reedy, DO  [facility] Pager [phone]  If 7PM-7AM, please contact night-coverage [email or web address] Password [email or web address] 08/18/2014, 1:49 PM   LOS: 6 days

## 2014-08-18 NOTE — Progress Notes (Signed)
ANTIBIOTIC CONSULT NOTE - INITIAL  Pharmacy Consult for vancomycin Indication: sepsis  Allergies  Allergen Reactions  . Cefuroxime Axetil Anaphylaxis  . Penicillins Anaphylaxis and Other (See Comments)    ?can take amoxicillin?  [name] [Benzonatate] Anaphylaxis  . Shellfish Allergy Itching and Other (See Comments)    Took benadryl to alleviate reaction    Patient Measurements: Height: 5\' 8"  (172.7 cm) Weight: 129 lb 6.6 oz (58.7 kg) IBW/kg (Calculated) : 68.4 Adjusted Body Weight:   Vital Signs: Temp: 98.4 F (36.9 C) (11/11 0547) Temp Source: Oral (11/11 0547) BP: 133/76 mmHg (11/11 1140) Pulse Rate: 97 (11/11 0547) Intake/Output from previous day: 11/10 0701 - 11/11 0700 In: 1290 [P.O.:840; IV Piggyback:450] Out: 2500 [Urine:2500] Intake/Output from this shift: Total I/O In: 351 [P.O.:351] Out: -   Labs:  Recent Labs  08/16/14 0259 08/18/14 0520  WBC  --  24.9*  HGB  --  7.8*  PLT  --  501*  CREATININE 0.66 0.67   Estimated Creatinine Clearance: 112.1 mL/min (by C-G formula based on Cr of 0.67).  Recent Labs  08/18/14 1018  VANCOTROUGH 20.6*     Medical History: Past Medical History  Diagnosis Date  . GERD (gastroesophageal reflux disease)   . Asthma   . Hx MRSA infection     on face  . Gastroparesis   . Diabetic neuropathy   . Seizures   . Stroke     spinal stroke in 4/15  . Diabetes mellitus     sees Dr. Loanne Drilling   . Family history of anesthesia complication     Pt mother can't have epidural procedures  . Dysrhythmia   . Pneumonia   . Arthritis   . Fibromyalgia     Medications:  See EMR  Assessment: Mr. Seago continues on antibiotics for bacteremia.  Today is day #6 of vancomycin.  Pt has 2/2 blood cultures with MSSA and 1/2 blood cultures with enterococcus.  Vancomycin trough returned today at 20.6 on 750/12h, while this is on high end, previous trough was subtherapeutic (13.2) on 500/12h.  MRI of hip showed no definitive  destruction of hip, but osteomyelitis was not ruled out.  MRI brain showed no abscess.  Goal of Therapy:  Vancomycin trough level 15-20 mcg/ml  Plan:  Continue vancomycin 750 mg IV q12h Monitor renal fx closely and signs of accumulation  Repeat trough as indicated  Hughes Better, PharmD, BCPS Clinical Pharmacist Pager: [phone] 08/18/2014 12:16 PM

## 2014-08-18 NOTE — Progress Notes (Signed)
[place] for Infectious Disease   Day # 6 vancomycin   Subjective: Pt feels better. Has had several areas of skin where he had blistering burst   Antibiotics:  Anti-infectives    Start     Dose/Rate Route Frequency Ordered Stop   08/15/14 1100  vancomycin (VANCOCIN) IVPB 750 mg/150 ml premix     750 mg150 mL/hr over 60 Minutes Intravenous Every 12 hours 08/15/14 1031     08/13/14 2000  vancomycin (VANCOCIN) IVPB 750 mg/150 ml premix  Status:  Discontinued     750 mg150 mL/hr over 60 Minutes Intravenous Every 24 hours 08/13/14 0235 08/13/14 0750   08/13/14 1400  aztreonam (AZACTAM) 1 g in dextrose 5 % 50 mL IVPB  Status:  Discontinued     1 g100 mL/hr over 30 Minutes Intravenous 3 times per day 08/13/14 0646 08/14/14 1254   08/13/14 1000  vancomycin (VANCOCIN) 500 mg in sodium chloride 0.9 % 100 mL IVPB  Status:  Discontinued     500 mg100 mL/hr over 60 Minutes Intravenous Every 12 hours 08/13/14 0750 08/15/14 1031   08/13/14 0800  levofloxacin (LEVAQUIN) IVPB 750 mg  Status:  Discontinued     750 mg100 mL/hr over 90 Minutes Intravenous Every 24 hours 08/13/14 0639 08/13/14 1108   08/13/14 0400  fluconazole (DIFLUCAN) IVPB 200 mg  Status:  Discontinued     200 mg100 mL/hr over 60 Minutes Intravenous Every 24 hours 08/13/14 0148 08/13/14 1108   08/13/14 0245  meropenem (MERREM) 1 g in sodium chloride 0.9 % 100 mL IVPB  Status:  Discontinued     1 g200 mL/hr over 30 Minutes Intravenous Every 12 hours 08/13/14 0234 08/13/14 0639   08/12/14 1945  vancomycin (VANCOCIN) IVPB 1000 mg/200 mL premix     1,000 mg200 mL/hr over 60 Minutes Intravenous  Once 08/12/14 1935 08/12/14 2140   08/12/14 1945  imipenem-cilastatin (PRIMAXIN) 500 mg in sodium chloride 0.9 % 100 mL IVPB     500 mg200 mL/hr over 30 Minutes Intravenous  Once 08/12/14 1937 08/12/14 2036      Medications: Scheduled Meds: . albuterol  2.5 mg Nebulization TID  . atorvastatin  20 mg Oral Daily  . budesonide  0.25 mg  Nebulization Q6H  . collagenase   Topical Daily  . feeding supplement (GLUCERNA SHAKE)  237 mL Oral TID BM  . feeding supplement (PRO-STAT SUGAR FREE 64)  30 mL Oral Daily  . heparin subcutaneous  5,000 Units Subcutaneous 3 times per day  . insulin aspart  0-15 Units Subcutaneous TID WC  . insulin glargine  15 Units Subcutaneous Daily  . morphine  15 mg Oral Q12H  . pantoprazole  40 mg Oral Q1200  . pregabalin  100 mg Oral TID  . sodium chloride  3 mL Intravenous Q12H  . vancomycin  750 mg Intravenous Q12H   Continuous Infusions:   PRN Meds:.acetaminophen, LORazepam, methocarbamol, ondansetron **OR** ondansetron (ZOFRAN) IV, oxyCODONE-acetaminophen, traZODone    Objective: Weight change:   Intake/Output Summary (Last 24 hours) at 08/18/14 1814 Last data filed at 08/18/14 0900  Gross per 24 hour  Intake    711 ml  Output   1000 ml  Net   -289 ml   Blood pressure 99/56, pulse 98, temperature 99.5 F (37.5 C), temperature source Oral, resp. rate 28, height 5\' 8"  (1.727 m), weight 129 lb 6.6 oz (58.7 kg), SpO2 100 %. Temp:  [98.4 F (36.9 C)-99.5 F (37.5 C)] 99.5 F (37.5  C) (11/11 1330) Pulse Rate:  [97-98] 98 (11/11 1330) Resp:  [24-28] 28 (11/11 0547) BP: (93-133)/(56-96) 99/56 mmHg (11/11 1330) SpO2:  [83 %-100 %] 100 % (11/11 1457)  Physical Exam: General: Alert and awake, oriented x3, not in any acute distress. HEENT: anicteric sclera,, EOMI CVS regular rate, normal r,  no murmur rubs or gallops Chest: clear to auscultation bilaterally, no wheezing, rales or rhonchi Abdomen: soft nontender, nondistended, Extremities: contractures: he has decubitus ulcers see pictures previously He has area on left foot where blister had burst   CBC:  Recent Labs Lab 08/12/14 1840 08/13/14 0235 08/14/14 0329 08/15/14 0325 08/18/14 0520  HGB 8.8* 8.5* 7.4* 7.6* 7.8*  HCT 27.8* 26.8* 23.0* 23.1* 23.7*  PLT 425* 420* 416* 477* 501*     BMET  Recent Labs   08/16/14 0259 08/18/14 0520  NA 140 136*  K 4.1 3.5*  CL 107 101  CO2 19 22  GLUCOSE 181* 143*  BUN 7 9  CREATININE 0.66 0.67  CALCIUM 9.6 8.7     Liver Panel   Recent Labs  08/18/14 0520  PROT 6.9  ALBUMIN 2.2*  AST 8  ALT 6  ALKPHOS 125*  BILITOT 0.3       Sedimentation Rate No results for input(s): ESRSEDRATE in the last 72 hours. C-Reactive Protein No results for input(s): CRP in the last 72 hours.  Micro Results: Recent Results (from the past 720 hour(s))  MRSA PCR Screening     Status: None   Collection Time: 07/30/14  6:35 AM  Result Value Ref Range Status   MRSA by PCR NEGATIVE NEGATIVE Final    Comment:        The GeneXpert MRSA Assay (FDA approved for NASAL specimens only), is one component of a comprehensive MRSA colonization surveillance program. It is not intended to diagnose MRSA infection nor to guide or monitor treatment for MRSA infections.  Blood Culture (routine x 2)     Status: None   Collection Time: 08/12/14  6:40 PM  Result Value Ref Range Status   Specimen Description BLOOD RIGHT [facility]  Final   Special Requests BOTTLES DRAWN AEROBIC AND ANAEROBIC 5CC EACH  Final   Culture  Setup Time   Final    08/13/2014 01:00 Performed at [facility]    Culture   Final    STAPHYLOCOCCUS AUREUS Note: SUSCEPTIBILITIES PERFORMED ON PREVIOUS CULTURE WITHIN THE LAST 5 DAYS. Note: Gram Stain Report Called to,Read Back By and Verified With: [facility] WORK RN [phone] Performed at [facility]    Report Status 08/16/2014 FINAL  Final  Urine culture     Status: None   Collection Time: 08/12/14  6:40 PM  Result Value Ref Range Status   Specimen Description URINE, CATHETERIZED  Final   Special Requests NONE  Final   Culture  Setup Time   Final    08/13/2014 00:43 Performed at [place]   Final    >=100,000 COLONIES/ML Performed at [place] Performed at [facility]    Report Status 08/16/2014 FINAL  Final   Organism ID, Bacteria KLEBSIELLA PNEUMONIAE  Final      Susceptibility   Klebsiella pneumoniae - MIC*    AMPICILLIN >=32 RESISTANT Resistant     CEFAZOLIN <=4 SENSITIVE Sensitive     CEFTRIAXONE <=1 SENSITIVE Sensitive     CIPROFLOXACIN 0.5 SENSITIVE  Sensitive     GENTAMICIN <=1 SENSITIVE Sensitive     LEVOFLOXACIN 2 SENSITIVE Sensitive     NITROFURANTOIN 64 INTERMEDIATE Intermediate     TOBRAMYCIN <=1 SENSITIVE Sensitive     TRIMETH/SULFA <=20 SENSITIVE Sensitive     PIP/TAZO 32 INTERMEDIATE Intermediate     * KLEBSIELLA PNEUMONIAE  Blood Culture (routine x 2)     Status: None   Collection Time: 08/12/14  6:48 PM  Result Value Ref Range Status   Specimen Description BLOOD RIGHT WRIST  Final   Special Requests BOTTLES DRAWN AEROBIC AND ANAEROBIC 10CC EACH  Final   Culture  Setup Time   Final    08/13/2014 00:59 Performed at [facility]    Culture   Final    STAPHYLOCOCCUS AUREUS Note: RIFAMPIN AND GENTAMICIN SHOULD NOT BE USED AS SINGLE DRUGS FOR TREATMENT OF STAPH INFECTIONS. ENTEROCOCCUS SPECIES Note: COMBINATION THERAPY OF HIGH DOSE AMPICILLIN OR VANCOMYCIN, PLUS AN AMINOGLYCOSIDE, IS USUALLY INDICATED FOR SERIOUS ENTEROCOCCAL INFECTIONS. Note: Gram Stain Report Called to,Read Back By and Verified With: Paula Compton RN 700P    Report Status 08/16/2014 FINAL  Final   Organism ID, Bacteria STAPHYLOCOCCUS AUREUS  Final   Organism ID, Bacteria ENTEROCOCCUS SPECIES  Final      Susceptibility   Staphylococcus aureus - MIC*    CLINDAMYCIN >=8 RESISTANT Resistant     ERYTHROMYCIN >=8 RESISTANT Resistant     GENTAMICIN <=0.5 SENSITIVE Sensitive     LEVOFLOXACIN 0.25 SENSITIVE Sensitive     OXACILLIN 1 SENSITIVE Sensitive     PENICILLIN >=0.5 RESISTANT Resistant     RIFAMPIN <=0.5 SENSITIVE Sensitive     TRIMETH/SULFA 20 SENSITIVE Sensitive     VANCOMYCIN <=0.5 SENSITIVE Sensitive      TETRACYCLINE >=16 RESISTANT Resistant     MOXIFLOXACIN* <=0.25 SENSITIVE Sensitive      * SET UP TIME:  [id number]    * STAPHYLOCOCCUS AUREUS   Enterococcus species - MIC*    AMPICILLIN <=2 SENSITIVE Sensitive     VANCOMYCIN 1 SENSITIVE Sensitive     * ENTEROCOCCUS SPECIES  MRSA PCR Screening     Status: None   Collection Time: 08/13/14 12:43 AM  Result Value Ref Range Status   MRSA by PCR NEGATIVE NEGATIVE Final    Comment:        The GeneXpert MRSA Assay (FDA approved for NASAL specimens only), is one component of a comprehensive MRSA colonization surveillance program. It is not intended to diagnose MRSA infection nor to guide or monitor treatment for MRSA infections.   Culture, respiratory (NON-Expectorated)     Status: None   Collection Time: 08/13/14 11:52 AM  Result Value Ref Range Status   Specimen Description TRACHEAL ASPIRATE  Final   Special Requests NONE  Final   Gram Stain   Final    FEW WBC PRESENT,BOTH PMN AND MONONUCLEAR RARE SQUAMOUS EPITHELIAL CELLS PRESENT FEW GRAM POSITIVE COCCI IN PAIRS IN CLUSTERS RARE GRAM NEGATIVE RODS Performed at [facility]    Culture   Final    ABUNDANT STAPHYLOCOCCUS AUREUS Note: RIFAMPIN AND GENTAMICIN SHOULD NOT BE USED AS SINGLE DRUGS FOR TREATMENT OF STAPH INFECTIONS. Performed at [facility]    Report Status 08/16/2014 FINAL  Final   Organism ID, Bacteria STAPHYLOCOCCUS AUREUS  Final      Susceptibility   Staphylococcus aureus - MIC*    CLINDAMYCIN >=8 RESISTANT Resistant     ERYTHROMYCIN >=8 RESISTANT Resistant     GENTAMICIN <=0.5  SENSITIVE Sensitive     LEVOFLOXACIN 0.25 SENSITIVE Sensitive     OXACILLIN 1 SENSITIVE Sensitive     PENICILLIN >=0.5 RESISTANT Resistant     RIFAMPIN <=0.5 SENSITIVE Sensitive     TRIMETH/SULFA <=10 SENSITIVE Sensitive     VANCOMYCIN 1 SENSITIVE Sensitive     TETRACYCLINE >=16 RESISTANT Resistant     MOXIFLOXACIN <=0.25 SENSITIVE Sensitive     * ABUNDANT  STAPHYLOCOCCUS AUREUS  Culture, blood (routine x 2)     Status: None (Preliminary result)   Collection Time: 08/14/14  3:20 PM  Result Value Ref Range Status   Specimen Description BLOOD RIGHT ARM  Final   Special Requests BOTTLES DRAWN AEROBIC ONLY 6CC  Final   Culture  Setup Time   Final    08/15/2014 00:57 Performed at [facility]    Culture   Final           BLOOD CULTURE RECEIVED NO GROWTH TO DATE CULTURE WILL BE HELD FOR 5 DAYS BEFORE ISSUING A FINAL NEGATIVE REPORT Performed at [facility]    Report Status PENDING  Incomplete  Culture, blood (routine x 2)     Status: None (Preliminary result)   Collection Time: 08/14/14  3:30 PM  Result Value Ref Range Status   Specimen Description BLOOD RIGHT HAND  Final   Special Requests BOTTLES DRAWN AEROBIC ONLY 4CC  Final   Culture  Setup Time   Final    08/15/2014 00:57 Performed at [facility]    Culture   Final           BLOOD CULTURE RECEIVED NO GROWTH TO DATE CULTURE WILL BE HELD FOR 5 DAYS BEFORE ISSUING A FINAL NEGATIVE REPORT Performed at [facility]    Report Status PENDING  Incomplete  Clostridium Difficile by PCR     Status: None   Collection Time: 08/15/14  2:12 PM  Result Value Ref Range Status   C difficile by pcr NEGATIVE NEGATIVE Final    Studies/Results: Mr Kizzie Fantasia Contrast  08/17/2014   CLINICAL DATA:  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 Confusion with fevers and sepsis. Assess for CNS infection.  EXAM: MRI HEAD WITHOUT AND WITH CONTRAST  TECHNIQUE: Multiplanar, multiecho pulse sequences of the brain and surrounding structures were obtained without and with intravenous contrast.  CONTRAST:  78mL MULTIHANCE GADOBENATE DIMEGLUMINE 529 MG/ML IV SOLN  COMPARISON:  Head CT 07/29/2014.  MRI 01/29/2014.  FINDINGS: The brainstem and cerebellum are normal. The cerebral hemispheres again show a few foci of chronic T2 signal within the white matter, possibly related to old shear  injuries. Newly seen is edema in the splenium of the corpus callosum, more on the left than the right. This is associated with low level restricted diffusion. There is no contrast enhancement. No other active pathologic finding. After contrast administration, there is no abnormal enhancement of the brain or leptomeninges, including of the splenium of the corpus callosum  No evidence of mass lesion, hemorrhage, hydrocephalus or extra-axial collection. No pituitary mass. Sinuses are clear.  IMPRESSION: No evidence of intracranial abscess. The patient does have the new finding of edema signal within the splenium of the corpus callosum. The differential diagnosis includes the presenting lesion of multiple sclerosis, demyelination secondary to vitamin B12 deficiency (Marchiafava-Bignami disease), infarction due to hypoxia, and reversible lesions of the splenium associated with various infectious agents including influenza, rotavirus, E coli, and adenovirus. Other transient lesions can be caused by seizure, posterior reversible encephalopathy  and hypoglycemia.   Electronically Signed   By: Nelson Chimes M.D.   On: 08/17/2014 19:26   Mr Hip Right W Wo Contrast  08/17/2014   CLINICAL DATA:  Quadriplegic, status post cord infarct, evaluate for osteomyelitis of the right hip from decubitus ulcer  EXAM: MRI PELVIS WITHOUT AND WITH CONTRAST  TECHNIQUE: Multiplanar multisequence MR imaging of the pelvis was performed both before and after administration of intravenous contrast.  CONTRAST:  41mL MULTIHANCE GADOBENATE DIMEGLUMINE 529 MG/ML IV SOLN  COMPARISON:  None.  FINDINGS: There is no focal marrow signal abnormality of bilateral hips. There is no fracture or dislocation. There is no avascular necrosis. Bilateral sacroiliac joints are normal. There is no joint effusion. There is no gross labral or paralabral abnormality.  There is a decubitus ulcer inferior to the right ischial tuberosity with a small amount air within the  ulcer versus packing material. There is a mildly complex fluid collection extending from the decubitus ulcer to the posterior aspect of the right inferior pubic ramus measuring 1.8 x 1.6 x 3.7 cm. There is no definite cortical destruction. There is mild increased T2 signal within the posterior aspect of the right inferior pubic ramus just medial to the ischial tuberosity, which may be reactive versus early osteomyelitis.  There is diffuse mild increased T2 signal throughout the proximal thigh and pelvic musculature which is likely neurogenic.  There is severe bladder wall thickening with a Foley catheter present. The bladder wall thickening is likely secondary to neurogenic bladder.  IMPRESSION: 1. There is a decubitus ulcer inferior to the right ischial tuberosity with a small amount air within the ulcer versus packing material. There is a mildly complex fluid collection extending from the decubitus ulcer to the posterior aspect of the right inferior pubic ramus measuring 1.8 x 1.6 x 3.7 cm without definite cortical destruction. There is mild increased T2 signal within the posterior aspect of the right inferior pubic ramus just medial to the ischial tuberosity, which may be reactive versus early osteomyelitis.   Electronically Signed   By: Kathreen Devoid   On: 08/17/2014 20:36      Assessment/Plan:  Principal Problem:   Staphylococcus aureus bacteremia Active Problems:   Type 1 diabetes, uncontrolled, with neuropathy   Asthma   GERD   Spinal cord infarction (history of)   Acute encephalopathy   Pneumonia   Sepsis   Acute respiratory failure with hypercapnia   UTI (lower urinary tract infection)   Healthcare-associated pneumonia   Renal failure (ARF), acute on chronic   Sacral pressure sore   Severe sepsis   Bacteremia    Jason [name] is a 28 y.o. male with  Hx of paraplegia spinal cord infarct with MSSA and Enterococcus (latter in only 1/2 blood cultures) without clear source other  than open wounds   #1 Staphylococcus Aureus bacteremia       [place] [facility]icrobial Management Team Staphylococcus aureus bacteremia   Staphylococcus aureus bacteremia (SAB) is associated with a high rate of complications and mortality.  Specific aspects of clinical management are critical to optimizing the outcome of patients with SAB.  Therefore, the [facility] Antimicrobial Management Team [facility]) has initiated an intervention aimed at improving the management of SAB at [facility].  To do so, Infectious Diseases physicians are providing an evidence-based consult for the management of all patients with SAB.     Yes No Comments  Perform follow-up blood cultures (even if the patient is afebrile) to ensure clearance  of bacteremia [x]  []  08/14/14 BLOOD CULTURES COOKING NGTD  Remove vascular catheter and obtain follow-up blood cultures after the removal of the catheter []  []  DO NOT PLACE PICC UNTIL WE ARE 3-4 DAYS OUT FROM NEGATIVE BLOOD CULTURES  Perform echocardiography to evaluate for endocarditis (transthoracic ECHO is 40-50% sensitive, TEE is > 90% sensitive) []  []   TEE NEGATIVE FOR VEGETATIONS  Consult electrophysiologist to evaluate implanted cardiac device (pacemaker, ICD) []  []  NA  Ensure source control []  []  Have all abscesses been drained effectively? Have deep seeded infections (septic joints or osteomyelitis) had appropriate surgical debridement?  MRI RIGHT hip and pelvis suggest area under his eschar with fluid/necrotic debris next to an area of possible early osteomyelitis  Investigate for "metastatic" sites of infection []  []  Does the patient have ANY symptom or physical exam finding that would suggest a deeper infection (back or neck pain that may be suggestive of vertebral osteomyelitis or epidural abscess, muscle pain that could be a symptom of pyomyositis)?  Keep in mind that for deep seeded infections MRI imaging with contrast is preferred rather than other often  insensitive tests such as plain x-rays, especially early in a patient's presentation. SEE ABOVE DISCUSSION re his HIP.  THIS AREA NEEDS TO DRAIN AND HOPEFULLY HYDROTHERAPY ETC WILL ACCOMPLISH THIS BUT IF NOT I WOULD COME DOWN STRONGLY ON SIDE OF DEBRIDEMENT TO PREVENT RECURRENCE OF BACTEREMIA, WORSENING OF INFECTION IN BONE IF THAT IS INDEED WHAT WE ARE DEALING WITH  Change antibiotic therapy to [place] per Mom he has anaphylaxis []  []  Beta-lactam antibiotics are preferred for MSSA due to higher cure rates.   If on Vancomycin, goal trough should be 15 - 20 mcg/mL  Estimated duration of IV antibiotic therapy:    8 WEEKS WITH CONSIDERATION  TO REPEAT MRI HIP PRIOR TO STOPPING THERAPY  []  []  Consult case management for probably prolonged outpatient IV antibiotic therapy   #2 Enterococcus growing from 1/2 cultures on admission, LIKELY A  contaminant,      LOS: 6 days   [name] 08/18/2014, 6:14 PM

## 2014-08-18 NOTE — Progress Notes (Signed)
CARE MANAGEMENT NOTE 08/18/2014  Patient:  ISIDRO, MONKS   Account Number:  [id number]  Date Initiated:  08/16/2014  Documentation initiated by:  Elissa Hefty  Subjective/Objective Assessment:   adm w bacteremia     Action/Plan:   lives w fam, pcp dr Remo Lipps fry   Anticipated DC Date:     Anticipated DC Plan:  [place]  CM consult      Choice offered to / List presented to:             Status of service:  In process, will continue to follow Medicare Important Message given?   (If response is "NO", the following Medicare IM given date fields will be blank) Date Medicare IM given:   Medicare IM given by:   Date Additional Medicare IM given:   Additional Medicare IM given by:    Discharge Disposition:    Per UR Regulation:    If discussed at Long Length of Stay Meetings, dates discussed:   08/17/2014    Comments:  08/18/2014 1300 High Risk -pt is apart of [place], and assigned to [facility]. If dc home with need HH, [place] RN orders.  Jonnie Finner RN CCM Case Mgmt phone [phone]

## 2014-08-18 NOTE — Consult Note (Signed)
[place] November 11, 1985  [id number].   Requesting MD: Dr. 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  He has had some prior small sacral wounds, but he has been treated at the wound center with resolution.  About 3 weeks ago he developed this right ischial tuberosity wound.  His sacral wound is small and healing per mom.  The patient does have some pain sensation in this area, but no movement of his LEs.  He was admitted to the hospital with a Staph aureus bacteremia.  The source is thought to be from these wounds.  An MRI was completed as well.  This revealed a small fluid collection near the right pubic rami extending in from the right hip decub.  There was a possibility of some early osteo, but could just be inflammatory change as well.  We have been asked to see him in regards to wound management and the fluid collection seen on his MRI.  ROS: Please see HPI, otherwise negative  Family History  Problem Relation Age of Onset  . Diabetes Father   . Hypertension Father   . Asthma      fhx  . Hypertension      fhx  . Stroke      fhx  . Heart disease Mother     Past Medical History  Diagnosis Date  . GERD (gastroesophageal reflux disease)   . Asthma   . Hx MRSA infection     on face  . Gastroparesis   . Diabetic neuropathy   . Seizures   . Stroke     spinal stroke in 4/15  . Diabetes mellitus     sees Dr. Loanne Drilling   . Family history of anesthesia complication     Pt mother can't have epidural procedures  . Dysrhythmia   . Pneumonia   . Arthritis   . Fibromyalgia     Past Surgical History  Procedure Laterality Date  . Tonsillectomy    . Multiple extractions with alveoloplasty N/A 08/03/2014    Procedure: MULTIPLE EXTRACTIONS;  Surgeon: Gae Bon, DDS;  Location: [facility];  Service: Oral Surgery;   Laterality: N/A;  . Tee without cardioversion N/A 08/17/2014    Procedure: TRANSESOPHAGEAL ECHOCARDIOGRAM (TEE);  Surgeon: Dorothy Spark, MD;  Location: [facility] ENDOSCOPY;  Service: Cardiovascular;  Laterality: N/A;    Social History:  reports that he has been smoking Cigars and Cigarettes.  He has been smoking about 0.00 packs per day. He has never used smokeless tobacco. He reports that he does not drink alcohol or use illicit drugs.  Allergies:  Allergies  Allergen Reactions  . Cefuroxime Axetil Anaphylaxis  . Penicillins Anaphylaxis and Other (See Comments)    ?can take amoxicillin?  [name] [Benzonatate] Anaphylaxis  . Shellfish Allergy Itching and Other (See Comments)    Took benadryl to alleviate reaction    Medications Prior to Admission  Medication Sig Dispense Refill  . albuterol (PROVENTIL) (2.5 MG/3ML) 0.083% nebulizer solution Take 3 mLs (2.5 mg total) by nebulization every 4 (four) hours as needed for wheezing or shortness of breath. 75 mL 3  . atorvastatin (LIPITOR) 20 MG tablet Take 20 mg by mouth daily.  0  . baclofen (LIORESAL) 10 MG tablet Take 10-20 mg by mouth 3 (three) times daily. Takes 55m in morning and lunchtime and  takes 60m at bedtime    . dexlansoprazole (DEXILANT) 60 MG capsule Take 60 mg by mouth daily.    . diclofenac sodium (VOLTAREN) 1 % GEL Apply 2 g topically daily as needed (for pain).    .Marland Kitchendiphenoxylate-atropine (LOMOTIL) 2.5-0.025 MG per tablet Take 1 tablet by mouth 2 (two) times daily. 60 tablet 0  . fluconazole (DIFLUCAN) 200 MG tablet Take 1 tablet (200 mg total) by mouth daily. 14 tablet 0  . furosemide (LASIX) 40 MG tablet Take 0.5 tablets (20 mg total) by mouth daily as needed for fluid. 30 tablet 0  . glucagon (GLUCAGON EMERGENCY) 1 MG injection Inject 1 mg into the vein once as needed. 1 each 12  . insulin aspart (NOVOLOG) 100 UNIT/ML injection Inject 5 Units into the skin 3 (three) times daily with meals. 10 mL 2  . insulin glargine  (LANTUS) 100 UNIT/ML injection Inject 0.22 mLs (22 Units total) into the skin at bedtime. (Patient taking differently: Inject 20 Units into the skin at bedtime. ) 10 mL 11  . lidocaine (XYLOCAINE) 2 % jelly Apply 1 application topically 4 (four) times daily as needed (pain).   11  . loratadine (CLARITIN) 10 MG tablet Take 10 mg by mouth 2 (two) times daily as needed for allergies.    . metaxalone (SKELAXIN) 800 MG tablet Take 800 mg by mouth 3 (three) times daily as needed for muscle spasms.    . methocarbamol (ROBAXIN) 750 MG tablet Take 750 mg by mouth 4 (four) times daily as needed.  1  . metoCLOPramide (REGLAN) 5 MG tablet Take 1 tablet (5 mg total) by mouth 2 (two) times a week. And as needed for nausea 8 tablet 3  . morphine (MS CONTIN) 30 MG 12 hr tablet Take 1 tablet (30 mg total) by mouth every 12 (twelve) hours. 60 tablet 0  . ondansetron (ZOFRAN) 8 MG tablet Take 8 mg by mouth 2 (two) times daily as needed for nausea or vomiting.   5  . OxyCODONE (OXYCONTIN) 15 mg T12A 12 hr tablet Take 15 mg by mouth every 12 (twelve) hours.    .Marland KitchenoxyCODONE-acetaminophen (PERCOCET) 10-325 MG per tablet Take 1 tablet by mouth every 8 (eight) hours as needed for pain. 90 tablet 0  . pregabalin (LYRICA) 100 MG capsule Take 100-200 mg by mouth 4 (four) times daily. Take 100 mg by mouth in the morning, take 100 mg by mouth in the afternoon, take 100 mg by mouth in the evening and take 200 mg by mouth at bedtime.    . silver sulfADIAZINE (SILVADENE) 1 % cream Apply 1 application topically 2 (two) times a week. Tuesday and thursday    . amLODipine (NORVASC) 2.5 MG tablet Take 1 tablet (2.5 mg total) by mouth daily. 30 tablet 0  . clindamycin (CLEOCIN) 300 MG capsule Take 1 capsule (300 mg total) by mouth 3 (three) times daily. 21 capsule 0  . glucose blood (ACCU-CHEK AVIVA PLUS) test strip Use to check blood sugar 4 times per day 400 each 2    Blood pressure 99/56, pulse 98, temperature 99.5 F (37.5 C),  temperature source Oral, resp. rate 28, height _0  (1.727 m), weight 129 lb 6.6 oz (58.7 kg), SpO2 100 %. Physical Exam: General: pleasant, somewhat frail appearing black male who is laying in bed in NAD HEENT: head is normocephalic, atraumatic.  Sclera are noninjected.  PERRL.  Ears and nose without any masses or lesions.  Mouth is pink and moist  MS: all 4 extremities are symmetrical with no cyanosis, clubbing, or edema.  Significant atrophy is noted of his lower extremities.  He has healing blisters on his left 2nd and 3rd knuckles on his fingers as well as the dorsal side of his left foot.   Skin: warm and dry with no masses, lesions, or rashes.  He does however, have a small stage 2 wound on his sacrum.  This is not infected.  He has an unstageable right ischial tuberosity ulcer with white tight eschar.  There feels as if there is some crepitus to this eschar, which goes along with the air seen on MRI underneath this eschar.  No erythema or purulent drainage or foul smell is noted.  This wound measures about 2x3cm with no depth. Psych: A&Ox3 with an appropriate affect.    Results for orders placed or performed during the hospital encounter of 08/12/14 (from the past 48 hour(s))  Glucose, capillary     Status: Abnormal   Collection Time: 08/16/14  4:46 PM  Result Value Ref Range   Glucose-Capillary 111 (H) 70 - 99 mg/dL  Glucose, capillary     Status: Abnormal   Collection Time: 08/16/14  9:43 PM  Result Value Ref Range   Glucose-Capillary 136 (H) 70 - 99 mg/dL   Comment 1 Documented in Chart    Comment 2 Notify RN   Glucose, capillary     Status: Abnormal   Collection Time: 08/17/14  7:56 AM  Result Value Ref Range   Glucose-Capillary 211 (H) 70 - 99 mg/dL  Glucose, capillary     Status: Abnormal   Collection Time: 08/17/14 12:06 PM  Result Value Ref Range   Glucose-Capillary 135 (H) 70 - 99 mg/dL  Glucose, capillary     Status: Abnormal   Collection Time: 08/17/14  6:10 PM   Result Value Ref Range   Glucose-Capillary 154 (H) 70 - 99 mg/dL  Glucose, capillary     Status: Abnormal   Collection Time: 08/17/14 10:16 PM  Result Value Ref Range   Glucose-Capillary 105 (H) 70 - 99 mg/dL   Comment 1 Notify RN    Comment 2 Documented in Chart   Comprehensive metabolic panel     Status: Abnormal   Collection Time: 08/18/14  5:20 AM  Result Value Ref Range   Sodium 136 (L) 137 - 147 mEq/L   Potassium 3.5 (L) 3.7 - 5.3 mEq/L   Chloride 101 96 - 112 mEq/L   CO2 22 19 - 32 mEq/L   Glucose, Bld 143 (H) 70 - 99 mg/dL   BUN 9 6 - 23 mg/dL   Creatinine, Ser 0.67 0.50 - 1.35 mg/dL   Calcium 8.7 8.4 - 10.5 mg/dL   Total Protein 6.9 6.0 - 8.3 g/dL   Albumin 2.2 (L) 3.5 - 5.2 g/dL   AST 8 0 - 37 U/L   ALT 6 0 - 53 U/L   Alkaline Phosphatase 125 (H) 39 - 117 U/L   Total Bilirubin 0.3 0.3 - 1.2 mg/dL   GFR calc non Af Amer >90 >90 mL/min   GFR calc Af Amer >90 >90 mL/min    Comment: (NOTE) The eGFR has been calculated using the CKD EPI equation. This calculation has not been validated in all clinical situations. eGFR's persistently <90 mL/min signify possible Chronic Kidney Disease.    Anion gap 13 5 - 15  CBC     Status: Abnormal   Collection Time: 08/18/14  5:20 AM  Result Value  Ref Range   WBC 24.9 (H) 4.0 - 10.5 K/uL   RBC 3.07 (L) 4.22 - 5.81 MIL/uL   Hemoglobin 7.8 (L) 13.0 - 17.0 g/dL   HCT 23.7 (L) 39.0 - 52.0 %   MCV 77.2 (L) 78.0 - 100.0 fL   MCH 25.4 (L) 26.0 - 34.0 pg   MCHC 32.9 30.0 - 36.0 g/dL   RDW 15.5 11.5 - 15.5 %   Platelets 501 (H) 150 - 400 K/uL  Glucose, capillary     Status: Abnormal   Collection Time: 08/18/14  7:55 AM  Result Value Ref Range   Glucose-Capillary 202 (H) 70 - 99 mg/dL  Vancomycin, trough     Status: Abnormal   Collection Time: 08/18/14 10:18 AM  Result Value Ref Range   Vancomycin Tr 20.6 (H) 10.0 - 20.0 ug/mL  Glucose, capillary     Status: Abnormal   Collection Time: 08/18/14 11:39 AM  Result Value Ref Range    Glucose-Capillary 123 (H) 70 - 99 mg/dL  TSH     Status: None   Collection Time: 08/18/14  2:20 PM  Result Value Ref Range   TSH 2.090 0.350 - 4.500 uIU/mL  Ammonia     Status: None   Collection Time: 08/18/14  2:20 PM  Result Value Ref Range   Ammonia 39 11 - 60 umol/L   Mr Jeri Cos Wo Contrast  08/17/2014   CLINICAL DATA:  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 Confusion with fevers and sepsis. Assess for CNS infection.  EXAM: MRI HEAD WITHOUT AND WITH CONTRAST  TECHNIQUE: Multiplanar, multiecho pulse sequences of the brain and surrounding structures were obtained without and with intravenous contrast.  CONTRAST:  17m MULTIHANCE GADOBENATE DIMEGLUMINE 529 MG/ML IV SOLN  COMPARISON:  Head CT 07/29/2014.  MRI 01/29/2014.  FINDINGS: The brainstem and cerebellum are normal. The cerebral hemispheres again show a few foci of chronic T2 signal within the white matter, possibly related to old shear injuries. Newly seen is edema in the splenium of the corpus callosum, more on the left than the right. This is associated with low level restricted diffusion. There is no contrast enhancement. No other active pathologic finding. After contrast administration, there is no abnormal enhancement of the brain or leptomeninges, including of the splenium of the corpus callosum  No evidence of mass lesion, hemorrhage, hydrocephalus or extra-axial collection. No pituitary mass. Sinuses are clear.  IMPRESSION: No evidence of intracranial abscess. The patient does have the new finding of edema signal within the splenium of the corpus callosum. The differential diagnosis includes the presenting lesion of multiple sclerosis, demyelination secondary to vitamin B12 deficiency (Marchiafava-Bignami disease), infarction due to hypoxia, and reversible lesions of the splenium associated with various infectious agents including influenza, rotavirus, E coli, and adenovirus. Other transient lesions can be caused by seizure, posterior  reversible encephalopathy and hypoglycemia.   Electronically Signed   By: MNelson ChimesM.D.   On: 08/17/2014 19:26   Mr Hip Right W Wo Contrast  08/17/2014   CLINICAL DATA:  Quadriplegic, status post cord infarct, evaluate for osteomyelitis of the right hip from decubitus ulcer  EXAM: MRI PELVIS WITHOUT AND WITH CONTRAST  TECHNIQUE: Multiplanar multisequence MR imaging of the pelvis was performed both before and after administration of intravenous contrast.  CONTRAST:  138mMULTIHANCE GADOBENATE DIMEGLUMINE 529 MG/ML IV SOLN  COMPARISON:  None.  FINDINGS: There is no focal marrow signal abnormality of bilateral hips. There is no fracture or dislocation. There is no avascular necrosis.  Bilateral sacroiliac joints are normal. There is no joint effusion. There is no gross labral or paralabral abnormality.  There is a decubitus ulcer inferior to the right ischial tuberosity with a small amount air within the ulcer versus packing material. There is a mildly complex fluid collection extending from the decubitus ulcer to the posterior aspect of the right inferior pubic ramus measuring 1.8 x 1.6 x 3.7 cm. There is no definite cortical destruction. There is mild increased T2 signal within the posterior aspect of the right inferior pubic ramus just medial to the ischial tuberosity, which may be reactive versus early osteomyelitis.  There is diffuse mild increased T2 signal throughout the proximal thigh and pelvic musculature which is likely neurogenic.  There is severe bladder wall thickening with a Foley catheter present. The bladder wall thickening is likely secondary to neurogenic bladder.  IMPRESSION: 1. There is a decubitus ulcer inferior to the right ischial tuberosity with a small amount air within the ulcer versus packing material. There is a mildly complex fluid collection extending from the decubitus ulcer to the posterior aspect of the right inferior pubic ramus measuring 1.8 x 1.6 x 3.7 cm without definite  cortical destruction. There is mild increased T2 signal within the posterior aspect of the right inferior pubic ramus just medial to the ischial tuberosity, which may be reactive versus early osteomyelitis.   Electronically Signed   By: Kathreen Devoid   On: 08/17/2014 20:36       Assessment/Plan 1. Stage 2 sacral decubitus ulcer 2. unstageable right ischial tuberosity ulcer with associated fluid collection 3. Staph aureus bacteremia 4. 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 I suspect the fluid collection seen on MRI is likely a combination of fluid and possibly liquefied necrotic tissue.  Since the patient has sensation, I do not want to just sharply debride this at the bedside as that would likely be uncomfortable.  I would like to avoid a trip to the OR if possible.  Cont daily santyl and add PT hydrotherapy.  If we can get the eschar to loosen up some, then it may make it much easier to try and debride this area.  abx therapy and further treatment per the primary service and ID.  We will cont to follow along.   Dow Blahnik E 08/18/2014, 3:41 PM Pager: [phone]

## 2014-08-18 NOTE — Consult Note (Signed)
NEURO HOSPITALIST CONSULT NOTE    Reason for Consult: MRI findings  HPI:                                                                                                                                          Jason [name] is an 28 y.o. 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 He presented to [facility] with AMS, fever and possible PNA and UTI. While in ED patient became more tachypneic and hypoxic needing Bipap. Patient initially refused intubation. Patient then developed worsening respiratory acidosis ABG (7.064/72.6/93.8) requiring intubation. . Patient placed on bipap and had improved mental status.Patietn found to have enterococcus bacteremia and placed on appropriate ABX by ID. Follow up TEE was obtained to look for bacterial endocarditis which was negative. While hospitalized patient also had multiple hypoglycemic events on 11/8. Mother states while he has been in the hospital he has been having periods of confusion which she feels have not gotten worse but also not improved.  She states the confusion is worse at night and improves or is not present in the morning.  She states he is alert and aware of his surrounding but at times during conversation he will start going of on tangents and talking about things either not pertinent to conversation or do not make sense. She aslo notes he will at times see things such as "spiders" which are not in the room. Patient himself acknowledges that he will do this and feels it is due to his lack of sleep. Due to his confusion while in hospital MRI was obtained on 08/17/2014.  Neurology was called to explain new MRI findings of edema signal within the splenium of the corpus callosum to family.   TSH pending Ammonia pending B12 pending  Past Medical History  Diagnosis Date  . GERD (gastroesophageal reflux disease)   . Asthma   . Hx MRSA infection     on face  . Gastroparesis   . Diabetic neuropathy   .  Seizures   . Stroke     spinal stroke in 4/15  . Diabetes mellitus     sees Dr. Loanne Drilling   . Family history of anesthesia complication     Pt mother can't have epidural procedures  . Dysrhythmia   . Pneumonia   . Arthritis   . Fibromyalgia     Past Surgical History  Procedure Laterality Date  . Tonsillectomy    . Multiple extractions with alveoloplasty N/A 08/03/2014    Procedure: MULTIPLE EXTRACTIONS;  Surgeon: Gae Bon, DDS;  Location: [place] [facility];  Service: Oral Surgery;  Laterality: N/A;  . Tee without cardioversion N/A 08/17/2014    Procedure: TRANSESOPHAGEAL ECHOCARDIOGRAM (TEE);  Surgeon: Dorothy Spark, MD;  Location: [facility];  Service:  Cardiovascular;  Laterality: N/A;    Family History  Problem Relation Age of Onset  . Diabetes Father   . Hypertension Father   . Asthma      fhx  . Hypertension      fhx  . Stroke      fhx  . Heart disease Mother      Social History:  reports that he has been smoking Cigars and Cigarettes.  He has been smoking about 0.00 packs per day. He has never used smokeless tobacco. He reports that he does not drink alcohol or use illicit drugs.  Allergies  Allergen Reactions  . Cefuroxime Axetil Anaphylaxis  . Penicillins Anaphylaxis and Other (See Comments)    ?can take amoxicillin?  [name] [Benzonatate] Anaphylaxis  . Shellfish Allergy Itching and Other (See Comments)    Took benadryl to alleviate reaction    MEDICATIONS:                                                                                                                     Scheduled: . albuterol  2.5 mg Nebulization TID  . atorvastatin  20 mg Oral Daily  . budesonide  0.25 mg Nebulization Q6H  . collagenase   Topical Daily  . feeding supplement (GLUCERNA SHAKE)  237 mL Oral TID BM  . feeding supplement (PRO-STAT SUGAR FREE 64)  30 mL Oral Daily  . heparin subcutaneous  5,000 Units Subcutaneous 3 times per day  . insulin aspart  0-15 Units Subcutaneous  TID WC  . insulin glargine  15 Units Subcutaneous Daily  . morphine  15 mg Oral Q12H  . pantoprazole  40 mg Oral Q1200  . pregabalin  100 mg Oral TID  . sodium chloride  3 mL Intravenous Q12H  . vancomycin  750 mg Intravenous Q12H     ROS:                                                                                                                                       History obtained from the patient and and mother  General ROS: negative for - chills, fatigue, fever, night sweats, weight gain or weight loss Psychological ROS: negative for - behavioral disorder, hallucinations, memory difficulties, mood swings or suicidal ideation Ophthalmic ROS: negative for - blurry vision, double vision, eye pain or loss of vision ENT ROS: negative for - epistaxis, nasal discharge, oral lesions,  sore throat, tinnitus or vertigo Allergy and Immunology ROS: negative for - hives or itchy/watery eyes Hematological and Lymphatic ROS: negative for - bleeding problems, bruising or swollen lymph nodes Endocrine ROS: negative for - galactorrhea, hair pattern changes, polydipsia/polyuria or temperature intolerance Respiratory ROS: negative for - cough, hemoptysis, shortness of breath or wheezing Cardiovascular ROS: negative for - chest pain, dyspnea on exertion, edema or irregular heartbeat Gastrointestinal ROS: negative for - abdominal pain, diarrhea, hematemesis, nausea/vomiting or stool incontinence Genito-Urinary ROS: negative for - dysuria, hematuria, incontinence or urinary frequency/urgency Musculoskeletal ROS: negative for - joint swelling or muscular weakness Neurological ROS: as noted in HPI Dermatological ROS: negative for rash and skin lesion changes   Blood pressure 99/56, pulse 98, temperature 99.5 F (37.5 C), temperature source Oral, resp. rate 28, height 5\' 8"  (1.727 m), weight 58.7 kg (129 lb 6.6 oz), SpO2 100 %.   Neurologic Examination:                                                                                                       General: NAD Mental Status: Alert, oriented, thought content appropriate.  Speech fluent without evidence of aphasia.  Able to follow 3 step commands without difficulty. Cranial Nerves: II: Discs flat bilaterally; Visual fields grossly normal, pupils equal, round, reactive to light and accommodation III,IV, VI: ptosis not present, extra-ocular motions intact bilaterally V,VII: smile symmetric, facial light touch sensation normal bilaterally VIII: hearing normal bilaterally IX,X: gag reflex present XI: bilateral shoulder shrug XII: midline tongue extension without atrophy or fasciculations  Motor: Able to give 4/5 strength bicep flexion bilaterally with weak triceps extension bilaterallyand wrist extension 3/5 bilaterally, weak grip bilaterally. Unable to move lower extremities.  No increased tone was noted throughout.  Sensory: Pinprick and light touch intact throughout, bilaterally Deep Tendon Reflexes:  Right: Upper Extremity   Left: Upper extremity   biceps (C-5 to C-6) 2/4   biceps (C-5 to C-6) 2/4 tricep (C7) 2/4    triceps (C7) 2/4 Brachioradialis (C6) 2/4  Brachioradialis (C6) 2/4  Lower Extremity Lower Extremity  No LE DTR  Plantars: equivical Cerebellar: normal finger-to-nose, unable to test Gait: unable to test CV: pulses palpable throughout    Lab Results: Basic Metabolic Panel:  Recent Labs Lab 08/13/14 0235 08/14/14 0329 08/15/14 0325 08/16/14 0259 08/18/14 0520  NA 138 139 142 140 136*  K 4.3 3.5* 3.4* 4.1 3.5*  CL 104 106 110 107 101  CO2 18* 17* 20 19 22   GLUCOSE 271* 264* 127* 181* 143*  BUN 34* 29* 13 7 9   CREATININE 1.60* 1.22 0.73 0.66 0.67  CALCIUM 9.4 9.5 9.6 9.6 8.7    Liver Function Tests:  Recent Labs Lab 08/12/14 1840 08/14/14 0329 08/18/14 0520  AST 10 10 8   ALT 7 7 6   ALKPHOS 169* 153* 125*  BILITOT 0.3 0.2* 0.3  PROT 7.9 6.6 6.9  ALBUMIN 2.7* 2.0* 2.2*    Recent  Labs Lab 08/12/14 1840  LIPASE 8*   No results for input(s): AMMONIA in the last 168 hours.  CBC:  Recent Labs Lab 08/12/14 1840 08/13/14 0235 08/14/14 0329 08/15/14 0325 08/18/14 0520  WBC 23.8* 30.0* 29.1* 28.6* 24.9*  NEUTROABS 20.6*  --   --   --   --   HGB 8.8* 8.5* 7.4* 7.6* 7.8*  HCT 27.8* 26.8* 23.0* 23.1* 23.7*  MCV 81.8 81.5 78.5 76.7* 77.2*  PLT 425* 420* 416* 477* 501*    Cardiac Enzymes: No results for input(s): CKTOTAL, CKMB, CKMBINDEX, TROPONINI in the last 168 hours.  Lipid Panel: No results for input(s): CHOL, TRIG, HDL, CHOLHDL, VLDL, LDLCALC in the last 168 hours.  CBG:  Recent Labs Lab 08/17/14 1206 08/17/14 1810 08/17/14 2216 08/18/14 0755 08/18/14 1139  GLUCAP 135* 154* 105* 10* 5*    Microbiology: Results for orders placed or performed during the hospital encounter of 08/12/14  Blood Culture (routine x 2)     Status: None   Collection Time: 08/12/14  6:40 PM  Result Value Ref Range Status   Specimen Description BLOOD RIGHT [facility]  Final   Special Requests BOTTLES DRAWN AEROBIC AND ANAEROBIC 5CC EACH  Final   Culture  Setup Time   Final    08/13/2014 01:00 Performed at [facility]    Culture   Final    STAPHYLOCOCCUS AUREUS Note: SUSCEPTIBILITIES PERFORMED ON PREVIOUS CULTURE WITHIN THE LAST 5 DAYS. Note: Gram Stain Report Called to,Read Back By and Verified With: [facility] WORK RN [phone] Performed at [facility]    Report Status 08/16/2014 FINAL  Final  Urine culture     Status: None   Collection Time: 08/12/14  6:40 PM  Result Value Ref Range Status   Specimen Description URINE, CATHETERIZED  Final   Special Requests NONE  Final   Culture  Setup Time   Final    08/13/2014 00:43 Performed at [place]   Final    >=100,000 COLONIES/ML Performed at [facility]    Culture   Final    KLEBSIELLA PNEUMONIAE Performed at [facility]    Report Status 08/16/2014 FINAL   Final   Organism ID, Bacteria KLEBSIELLA PNEUMONIAE  Final      Susceptibility   Klebsiella pneumoniae - MIC*    AMPICILLIN >=32 RESISTANT Resistant     CEFAZOLIN <=4 SENSITIVE Sensitive     CEFTRIAXONE <=1 SENSITIVE Sensitive     CIPROFLOXACIN 0.5 SENSITIVE Sensitive     GENTAMICIN <=1 SENSITIVE Sensitive     LEVOFLOXACIN 2 SENSITIVE Sensitive     NITROFURANTOIN 64 INTERMEDIATE Intermediate     TOBRAMYCIN <=1 SENSITIVE Sensitive     TRIMETH/SULFA <=20 SENSITIVE Sensitive     PIP/TAZO 32 INTERMEDIATE Intermediate     * KLEBSIELLA PNEUMONIAE  Blood Culture (routine x 2)     Status: None   Collection Time: 08/12/14  6:48 PM  Result Value Ref Range Status   Specimen Description BLOOD RIGHT [facility]  Final   Special Requests BOTTLES DRAWN AEROBIC AND ANAEROBIC 10CC EACH  Final   Culture  Setup Time   Final    08/13/2014 00:59 Performed at [facility]    Culture   Final    STAPHYLOCOCCUS AUREUS Note: RIFAMPIN AND GENTAMICIN SHOULD NOT BE USED AS SINGLE DRUGS FOR TREATMENT OF STAPH INFECTIONS. ENTEROCOCCUS SPECIES Note: COMBINATION THERAPY OF HIGH DOSE AMPICILLIN OR VANCOMYCIN, PLUS AN AMINOGLYCOSIDE, IS USUALLY INDICATED FOR SERIOUS ENTEROCOCCAL INFECTIONS. Note: Gram Stain Report Called to,Read Back By and Verified With: Paula Compton RN [phone]  Report Status 08/16/2014 FINAL  Final   Organism ID, Bacteria STAPHYLOCOCCUS AUREUS  Final   Organism ID, Bacteria ENTEROCOCCUS SPECIES  Final      Susceptibility   Staphylococcus aureus - MIC*    CLINDAMYCIN >=8 RESISTANT Resistant     ERYTHROMYCIN >=8 RESISTANT Resistant     GENTAMICIN <=0.5 SENSITIVE Sensitive     LEVOFLOXACIN 0.25 SENSITIVE Sensitive     OXACILLIN 1 SENSITIVE Sensitive     PENICILLIN >=0.5 RESISTANT Resistant     RIFAMPIN <=0.5 SENSITIVE Sensitive     TRIMETH/SULFA 20 SENSITIVE Sensitive     VANCOMYCIN <=0.5 SENSITIVE Sensitive     TETRACYCLINE >=16 RESISTANT Resistant     MOXIFLOXACIN* <=0.25 SENSITIVE  Sensitive      * SET UP TIME:  [id number]    * STAPHYLOCOCCUS AUREUS   Enterococcus species - MIC*    AMPICILLIN <=2 SENSITIVE Sensitive     VANCOMYCIN 1 SENSITIVE Sensitive     * ENTEROCOCCUS SPECIES  MRSA PCR Screening     Status: None   Collection Time: 08/13/14 12:43 AM  Result Value Ref Range Status   MRSA by PCR NEGATIVE NEGATIVE Final    Comment:        The GeneXpert MRSA Assay (FDA approved for NASAL specimens only), is one component of a comprehensive MRSA colonization surveillance program. It is not intended to diagnose MRSA infection nor to guide or monitor treatment for MRSA infections.   Culture, respiratory (NON-Expectorated)     Status: None   Collection Time: 08/13/14 11:52 AM  Result Value Ref Range Status   Specimen Description TRACHEAL ASPIRATE  Final   Special Requests NONE  Final   Gram Stain   Final    FEW WBC PRESENT,BOTH PMN AND MONONUCLEAR RARE SQUAMOUS EPITHELIAL CELLS PRESENT FEW GRAM POSITIVE COCCI IN PAIRS IN CLUSTERS RARE GRAM NEGATIVE RODS Performed at [facility]    Culture   Final    ABUNDANT STAPHYLOCOCCUS AUREUS Note: RIFAMPIN AND GENTAMICIN SHOULD NOT BE USED AS SINGLE DRUGS FOR TREATMENT OF STAPH INFECTIONS. Performed at [facility]    Report Status 08/16/2014 FINAL  Final   Organism ID, Bacteria STAPHYLOCOCCUS AUREUS  Final      Susceptibility   Staphylococcus aureus - MIC*    CLINDAMYCIN >=8 RESISTANT Resistant     ERYTHROMYCIN >=8 RESISTANT Resistant     GENTAMICIN <=0.5 SENSITIVE Sensitive     LEVOFLOXACIN 0.25 SENSITIVE Sensitive     OXACILLIN 1 SENSITIVE Sensitive     PENICILLIN >=0.5 RESISTANT Resistant     RIFAMPIN <=0.5 SENSITIVE Sensitive     TRIMETH/SULFA <=10 SENSITIVE Sensitive     VANCOMYCIN 1 SENSITIVE Sensitive     TETRACYCLINE >=16 RESISTANT Resistant     MOXIFLOXACIN <=0.25 SENSITIVE Sensitive     * ABUNDANT STAPHYLOCOCCUS AUREUS  Culture, blood (routine x 2)     Status: None (Preliminary  result)   Collection Time: 08/14/14  3:20 PM  Result Value Ref Range Status   Specimen Description BLOOD RIGHT ARM  Final   Special Requests BOTTLES DRAWN AEROBIC ONLY [place]  Final   Culture  Setup Time   Final    08/15/2014 00:57 Performed at [facility]    Culture   Final           BLOOD CULTURE RECEIVED NO GROWTH TO DATE CULTURE WILL BE HELD FOR 5 DAYS BEFORE ISSUING A FINAL NEGATIVE REPORT Performed at [facility]    Report Status PENDING  Incomplete  Culture, blood (routine x 2)     Status: None (Preliminary result)   Collection Time: 08/14/14  3:30 PM  Result Value Ref Range Status   Specimen Description BLOOD RIGHT HAND  Final   Special Requests BOTTLES DRAWN AEROBIC ONLY 4CC  Final   Culture  Setup Time   Final    08/15/2014 00:57 Performed at [facility]    Culture   Final           BLOOD CULTURE RECEIVED NO GROWTH TO DATE CULTURE WILL BE HELD FOR 5 DAYS BEFORE ISSUING A FINAL NEGATIVE REPORT Performed at [facility]    Report Status PENDING  Incomplete  Clostridium Difficile by PCR     Status: None   Collection Time: 08/15/14  2:12 PM  Result Value Ref Range Status   C difficile by pcr NEGATIVE NEGATIVE Final    Coagulation Studies: No results for input(s): LABPROT, INR in the last 72 hours.  Imaging: Mr Jeri Cos [facility] Contrast  08/17/2014   CLINICAL DATA:  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 Confusion with fevers and sepsis. Assess for CNS infection.  EXAM: MRI HEAD WITHOUT AND WITH CONTRAST  TECHNIQUE: Multiplanar, multiecho pulse sequences of the brain and surrounding structures were obtained without and with intravenous contrast.  CONTRAST:  54mL MULTIHANCE GADOBENATE DIMEGLUMINE 529 MG/ML IV SOLN  COMPARISON:  Head CT 07/29/2014.  MRI 01/29/2014.  FINDINGS: The brainstem and cerebellum are normal. The cerebral hemispheres again show a few foci of chronic T2 signal within the white matter, possibly related to old shear  injuries. Newly seen is edema in the splenium of the corpus callosum, more on the left than the right. This is associated with low level restricted diffusion. There is no contrast enhancement. No other active pathologic finding. After contrast administration, there is no abnormal enhancement of the brain or leptomeninges, including of the splenium of the corpus callosum  No evidence of mass lesion, hemorrhage, hydrocephalus or extra-axial collection. No pituitary mass. Sinuses are clear.  IMPRESSION: No evidence of intracranial abscess. The patient does have the new finding of edema signal within the splenium of the corpus callosum. The differential diagnosis includes the presenting lesion of multiple sclerosis, demyelination secondary to vitamin B12 deficiency (Marchiafava-Bignami disease), infarction due to hypoxia, and reversible lesions of the splenium associated with various infectious agents including influenza, rotavirus, E coli, and adenovirus. Other transient lesions can be caused by seizure, posterior reversible encephalopathy and hypoglycemia.   Electronically Signed   By: Nelson Chimes M.D.   On: 08/17/2014 19:26   Mr Hip Right W Wo Contrast  08/17/2014   CLINICAL DATA:  Quadriplegic, status post cord infarct, evaluate for osteomyelitis of the right hip from decubitus ulcer  EXAM: MRI PELVIS WITHOUT AND WITH CONTRAST  TECHNIQUE: Multiplanar multisequence MR imaging of the pelvis was performed both before and after administration of intravenous contrast.  CONTRAST:  88mL MULTIHANCE GADOBENATE DIMEGLUMINE 529 MG/ML IV SOLN  COMPARISON:  None.  FINDINGS: There is no focal marrow signal abnormality of bilateral hips. There is no fracture or dislocation. There is no avascular necrosis. Bilateral sacroiliac joints are normal. There is no joint effusion. There is no gross labral or paralabral abnormality.  There is a decubitus ulcer inferior to the right ischial tuberosity with a small amount air within the  ulcer versus packing material. There is a mildly complex fluid collection extending from the decubitus ulcer to the posterior aspect of the right inferior pubic ramus  measuring 1.8 x 1.6 x 3.7 cm. There is no definite cortical destruction. There is mild increased T2 signal within the posterior aspect of the right inferior pubic ramus just medial to the ischial tuberosity, which may be reactive versus early osteomyelitis.  There is diffuse mild increased T2 signal throughout the proximal thigh and pelvic musculature which is likely neurogenic.  There is severe bladder wall thickening with a Foley catheter present. The bladder wall thickening is likely secondary to neurogenic bladder.  IMPRESSION: 1. There is a decubitus ulcer inferior to the right ischial tuberosity with a small amount air within the ulcer versus packing material. There is a mildly complex fluid collection extending from the decubitus ulcer to the posterior aspect of the right inferior pubic ramus measuring 1.8 x 1.6 x 3.7 cm without definite cortical destruction. There is mild increased T2 signal within the posterior aspect of the right inferior pubic ramus just medial to the ischial tuberosity, which may be reactive versus early osteomyelitis.   Electronically Signed   By: Kathreen Devoid   On: 08/17/2014 20:36    Etta Quill PA-C [facility] [phone]  08/18/2014, 2:42 PM   Patient seen and examined.  Clinical course and management discussed.  Necessary edits performed.  I agree with the above.  Assessment and plan of care developed and discussed below.    Assessment/Plan: 28 year old male with quadriplegia s/p spinal cord infarct admitted with sepsis.  Hospitalization has been complicated by periods of hypoxia and hypoglycemia.  Patient has also had an altered mental status since extubation.  MRI of the brain done in work up of this.  MRI reviewed and shows edema in the area of the splenium of the corpus callosum.  This  area was actually present on his MRI in April but is more prominent on this study.  Conversation had with mother.  This change may be secondary to his episodes of hypoglycemia and hypoxia and injury related to the same.  Doubt infection.  Patient on adequate antibiotics.  This may be the cause of the patient's mental status changes but can not rule out an encephalopathy due to his infection, wbc count remains elevated.  If encephalopathy causing mental status changes patient will likely improve significantly with time.    Recommendations: 1.  Repeat MRI in 1-2 months    Alexis Goodell, MD [facility] [phone]  08/18/2014  6:17 PM

## 2014-08-19 ENCOUNTER — Inpatient Hospital Stay (HOSPITAL_COMMUNITY): Payer: Medicaid Other

## 2014-08-19 DIAGNOSIS — R509 Fever, unspecified: Secondary | ICD-10-CM

## 2014-08-19 LAB — URINALYSIS, ROUTINE W REFLEX MICROSCOPIC
Bilirubin Urine: NEGATIVE
Bilirubin Urine: NEGATIVE
Glucose, UA: 100 mg/dL — AB
Glucose, UA: NEGATIVE mg/dL
KETONES UR: NEGATIVE mg/dL
Ketones, ur: NEGATIVE mg/dL
NITRITE: NEGATIVE
Nitrite: NEGATIVE
PROTEIN: 30 mg/dL — AB
Protein, ur: NEGATIVE mg/dL
Specific Gravity, Urine: 1.017 (ref 1.005–1.030)
Specific Gravity, Urine: 1.008 (ref 1.005–1.030)
Urobilinogen, UA: 0.2 mg/dL (ref 0.0–1.0)
Urobilinogen, UA: 0.2 mg/dL (ref 0.0–1.0)
pH: 5.5 (ref 5.0–8.0)
pH: 6 (ref 5.0–8.0)

## 2014-08-19 LAB — URINE MICROSCOPIC-ADD ON

## 2014-08-19 LAB — GLUCOSE, CAPILLARY
GLUCOSE-CAPILLARY: 214 mg/dL — AB (ref 70–99)
GLUCOSE-CAPILLARY: 195 mg/dL — AB (ref 70–99)
Glucose-Capillary: 297 mg/dL — ABNORMAL HIGH (ref 70–99)
Glucose-Capillary: 284 mg/dL — ABNORMAL HIGH (ref 70–99)

## 2014-08-19 LAB — CBC WITH DIFFERENTIAL/PLATELET
BASOS ABS: 0 10*3/uL (ref 0.0–0.1)
Basophils Relative: 0 % (ref 0–1)
EOS PCT: 1 % (ref 0–5)
Eosinophils Absolute: 0.3 10*3/uL (ref 0.0–0.7)
HCT: 22.9 % — ABNORMAL LOW (ref 39.0–52.0)
Hemoglobin: 7.5 g/dL — ABNORMAL LOW (ref 13.0–17.0)
LYMPHS ABS: 4.7 10*3/uL — AB (ref 0.7–4.0)
LYMPHS PCT: 17 % (ref 12–46)
MCH: 26.2 pg (ref 26.0–34.0)
MCHC: 32.8 g/dL (ref 30.0–36.0)
MCV: 80.1 fL (ref 78.0–100.0)
MONOS PCT: 11 % (ref 3–12)
Monocytes Absolute: 3 10*3/uL — ABNORMAL HIGH (ref 0.1–1.0)
Neutro Abs: 19.6 10*3/uL — ABNORMAL HIGH (ref 1.7–7.7)
Neutrophils Relative %: 71 % (ref 43–77)
PLATELETS: 515 10*3/uL — AB (ref 150–400)
RBC: 2.86 MIL/uL — AB (ref 4.22–5.81)
RDW: 15.7 % — ABNORMAL HIGH (ref 11.5–15.5)
WBC: 27.6 10*3/uL — AB (ref 4.0–10.5)

## 2014-08-19 LAB — BASIC METABOLIC PANEL
ANION GAP: 13 (ref 5–15)
BUN: 9 mg/dL (ref 6–23)
CO2: 22 meq/L (ref 19–32)
Calcium: 8.5 mg/dL (ref 8.4–10.5)
Chloride: 100 mEq/L (ref 96–112)
Creatinine, Ser: 0.67 mg/dL (ref 0.50–1.35)
GFR calc Af Amer: >90 mL/min (ref >90)
Glucose, Bld: 341 mg/dL — ABNORMAL HIGH (ref 70–99)
POTASSIUM: 4.2 meq/L (ref 3.7–5.3)
SODIUM: 135 meq/L — AB (ref 137–147)

## 2014-08-19 MED ORDER — INSULIN ASPART 100 UNIT/ML ~~LOC~~ SOLN
4.0000 [IU] | Freq: Three times a day (TID) | SUBCUTANEOUS | Status: DC
  Administered 2014-08-19 – 2014-08-21 (×5): 4 [IU] via SUBCUTANEOUS

## 2014-08-19 NOTE — Progress Notes (Signed)
Inpatient Diabetes Program Recommendations  AACE/ADA: New Consensus Statement on Inpatient Glycemic Control (2013)  Target Ranges:  Prepandial:   less than 140 mg/dL      Peak postprandial:   less than 180 mg/dL (1-2 hours)      Critically ill patients:  140 - 180 mg/dL   Glucose elevated and sustained in 200's and 300. Pt takes 5 units tidwc at home. Inpatient Diabetes Program Recommendations Insulin - Basal: xxxxxxxxxx Insulin - Meal Coverage: Pt takes 5 units meal coverage novolog at home tidwc Please add 5 units tidwc novolog here as well (Pt getting nutritional supplements throughout the day)  Thank you, Rosita Kea, RN, CNS, Diabetes Coordinator [phone])

## 2014-08-19 NOTE — Progress Notes (Signed)
PROGRESS NOTE  JAYVEON CONVEY HKV:[id number] DOB: Sep 28, 1986 DOA: 08/12/2014 PCP: [name], MD  Assessment/Plan: MSSA bacteremia  - he had a TEE today without vegetations. Continue vancomycin. - Infectious diseases consulted, appreciate input -08/14/2014 surveillance blood cultures negative -suspected source is right hip decubitus ulcer  Right hip decubitus ulcer -MRI right hip reveals complex fluid collection -Consulted general surgery for opinion -with new fever and persistent leukocytosis, may need to reconsider surgical debridement  Fever -Chest x-ray shows patchy opacities right middle lobe, left base with unchanged small left pleural effusion -08/19/2014--new Foley catheter placed--> Repeat UA and urine culture after new Foley placed -08/15/2014--C. Difficile PCR negative  Acute hypoxic respiratory failure - status post intubation, now on nasal cannula - Continue nebulizers with albuterol and Pulmicort -presently stable on 3 L nasal cannula  Acute encephalopathy -Multifactorial including the patient's sepsis/infectious process, opioids, hypoxemia -MR brain--new edema signal in the splenium of corpus callosum--> May be related to the patient's acute respiratory failure -Consulted neurology for opinion--appreciate Dr. [name] may be secondary to his episodes of hypoglycemia and hypoxia and injury related to the same. 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 No pulmonary edema. There is streaky right middle lobe and left basilar atelectasis or infiltrate. No pulmonary edema. Question small left pleural effusion.  IMPRESSION: Streaky right middle lobe and left basilar atelectasis or infiltrate. Question small left pleural effusion. No pulmonary edema.   Electronically Signed   By: Lahoma Crocker M.D.   On: 08/19/2014 12:58   Dg Chest 2 View  07/29/2014   CLINICAL DATA:  Hypotension, history of stroke, left arm weakness  EXAM: CHEST  2 VIEW  COMPARISON:  07/18/2014  FINDINGS: Cardiomediastinal silhouette is stable. No acute infiltrate or pleural effusion. No pulmonary edema. Stable old fracture deformity distal left clavicle.  IMPRESSION: No active cardiopulmonary disease.   Electronically Signed   By: Lahoma Crocker M.D.   On: 07/29/2014 17:54   Ct Head Wo Contrast  07/29/2014   CLINICAL DATA:  Hypertension.  Previous stroke.  EXAM: CT HEAD WITHOUT CONTRAST  TECHNIQUE: Contiguous axial images were obtained from the base of the skull through the vertex without intravenous contrast.  COMPARISON:  04/27/2014.  Brain MR dated 02/04/2014.  FINDINGS: Previously noted Chiari 1 malformation. Otherwise, normal appearing cerebral hemispheres and posterior fossa structures. Normal size and position of the ventricles. No intracranial hemorrhage, mass lesion or CT evidence of acute infarction. Unremarkable bones and included paranasal sinuses.  IMPRESSION: No acute abnormality.  Previously noted Chiari 1 malformation.   Electronically Signed   By: Enrique Sack M.D.   On:  07/29/2014 20:57  Mr Jeri Cos Wo Contrast  08/17/2014   CLINICAL DATA:  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 Confusion with fevers and sepsis. Assess for CNS infection.  EXAM: MRI HEAD WITHOUT AND WITH CONTRAST  TECHNIQUE: Multiplanar, multiecho pulse sequences of the brain and surrounding structures were obtained without and with intravenous contrast.  CONTRAST:  42mL MULTIHANCE GADOBENATE DIMEGLUMINE 529 MG/ML IV SOLN  COMPARISON:  Head CT 07/29/2014.  MRI 01/29/2014.  FINDINGS: The brainstem and cerebellum are normal. The cerebral hemispheres again show a few foci of chronic T2 signal within the white matter, possibly related to old shear injuries. Newly seen is edema in the splenium of the corpus callosum, more on the left than the right. This is associated with low level restricted diffusion. There is no contrast enhancement. No other active pathologic finding. After contrast administration, there is no abnormal enhancement of the brain or leptomeninges, including of the splenium of the corpus callosum  No evidence of mass lesion, hemorrhage, hydrocephalus or extra-axial collection. No pituitary mass. Sinuses are clear.  IMPRESSION: No evidence of intracranial abscess. The patient does have the new finding of edema signal within the splenium of the corpus callosum. The differential diagnosis includes the presenting lesion of multiple sclerosis, demyelination secondary to vitamin B12 deficiency (Marchiafava-Bignami disease), infarction due to hypoxia, and reversible lesions of the splenium associated with various infectious agents including influenza, rotavirus, E coli, and adenovirus. Other transient lesions can be caused by seizure, posterior reversible encephalopathy and hypoglycemia.   Electronically Signed   By: Nelson Chimes M.D.   On: 08/17/2014 19:26   Mr Hip Right W Wo Contrast  08/17/2014   CLINICAL DATA:  Quadriplegic, status post cord infarct, evaluate for osteomyelitis of the right hip  from decubitus ulcer  EXAM: MRI PELVIS WITHOUT AND WITH CONTRAST  TECHNIQUE: Multiplanar multisequence MR imaging of the pelvis was performed both before and after administration of intravenous contrast.  CONTRAST:  37mL MULTIHANCE GADOBENATE DIMEGLUMINE 529 MG/ML IV SOLN  COMPARISON:  None.  FINDINGS: There is no focal marrow signal abnormality of bilateral hips. There is no fracture or dislocation. There is no avascular necrosis. Bilateral sacroiliac joints are normal. There is no joint effusion. There is no gross labral or paralabral abnormality.  There is a decubitus ulcer inferior to the right ischial tuberosity with a small amount air within the ulcer versus packing material. There is a mildly complex fluid collection extending from the decubitus ulcer to the posterior aspect of the right inferior pubic ramus measuring 1.8 x 1.6 x 3.7 cm. There is no definite cortical destruction. There is mild increased T2 signal within the posterior aspect of the right inferior pubic ramus just medial to the ischial tuberosity, which may be reactive versus early osteomyelitis.  There is diffuse mild increased T2 signal throughout the proximal thigh and pelvic musculature which is likely neurogenic.  There is severe bladder wall thickening with a Foley catheter present. The bladder wall thickening is likely secondary to neurogenic bladder.  IMPRESSION: 1. There is a decubitus ulcer inferior to the right ischial tuberosity with a small amount air within the ulcer versus packing material. There is a mildly complex fluid collection extending from the decubitus ulcer to the posterior aspect of the right inferior pubic ramus measuring 1.8 x 1.6 x 3.7 cm without definite cortical destruction. There is mild increased T2 signal within the posterior aspect of the right inferior pubic ramus just medial to the ischial tuberosity, which may be reactive versus early osteomyelitis.  Electronically Signed   By: Kathreen Devoid   On:  08/17/2014 20:36   Dg Chest Port 1 View  08/14/2014   CLINICAL DATA:  Respiratory failure.  EXAM: PORTABLE CHEST - 1 VIEW  COMPARISON:  08/13/2014  FINDINGS: Endotracheal tube tip between the clavicular heads and carina. Gastric suction tube reaches the stomach.  Airspace disease in the left lower lobe is again noted, still extensive but less dense. Small left pleural effusion. Medial right basilar and right upper lobe (near the minor fissure) airspace disease again seen. No evidence of cavitation or pneumothorax.  IMPRESSION: 1. Bilateral pneumonia and small left pleural effusion. Aeration in the left lower lobe has improved since yesterday. 2. Endotracheal and orogastric tubes remain in good position.   Electronically Signed   By: Jorje Guild M.D.   On: 08/14/2014 07:47   Dg Chest Port 1 View  08/13/2014   CLINICAL DATA:  Intubation.  EXAM: PORTABLE CHEST - 1 VIEW  COMPARISON:  08/12/2014  FINDINGS: Interval placement of endotracheal tube with tip measuring 4.9 cm above the carinal. Enteric tube was placed. Tip is off the field of view but below the left hemidiaphragm. Heart size and pulmonary vascularity are normal. There is developing small left pleural effusion with consolidation in the left lung base suggesting pneumonia.  IMPRESSION: Appliances appear in satisfactory location. Increasing consolidation and effusion in the left lung base.   Electronically Signed   By: Lucienne Capers M.D.   On: 08/13/2014 06:11   Dg Chest Port 1 View  08/12/2014   CLINICAL DATA:  Fever R50.9 (ICD-10-CM). Altered mental status. Recent dental surgery.  EXAM: PORTABLE CHEST - 1 VIEW  COMPARISON:  07/29/2014  FINDINGS: New subtle densities at the left lung base. Heart size is normal. Trachea is midline. Negative for a pneumothorax.  IMPRESSION: Subtle densities at the left lung base. Findings most likely represent atelectasis.   Electronically Signed   By: Markus Daft M.D.   On: 08/12/2014 19:08          Subjective: Patient appears to be more lucid today. Denies headache, chest pain, sinus breath, nausea, vomiting, diarrhea, abdominal pain. He had one loose stool today.  Objective: Filed Vitals:   08/19/14 0516 08/19/14 1024 08/19/14 1347 08/19/14 1352  BP: 131/70  96/58   Pulse: 96  91   Temp: 101.4 F (38.6 C)  99.5 F (37.5 C)   TempSrc: Oral  Oral   Resp: 22  18   Height:      Weight:      SpO2: 100% 99% 100% 100%    Intake/Output Summary (Last 24 hours) at 08/19/14 1645 Last data filed at 08/19/14 1538  Gross per 24 hour  Intake   1200 ml  Output   3153 ml  Net  -1953 ml   Weight change:  Exam:   General:  Pt is alert, follows commands appropriately, not in acute distress  HEENT: No icterus, No thrush, No meningismus, [place]/AT  Cardiovascular: RRR, S1/S2, no rubs, no gallops  Respiratory: bibasilar rhonchi. No wheezing. Good air movement.  Abdomen: Soft/+BS, non tender, non distended, no guarding  Extremities: No edema, No lymphangitis, No petechiae, No rashes, no synovitis  Data Reviewed: Basic Metabolic Panel:  Recent Labs Lab 08/14/14 0329 08/15/14 0325 08/16/14 0259 08/18/14 0520 08/19/14 0358  NA 139 142 140 136* 135*  K 3.5* 3.4* 4.1 3.5* 4.2  CL 106 110 107 101 100  CO2 17* 20 19 22 22   GLUCOSE 264*  127* 181* 143* 341*  BUN 29* 13 7 9 9   CREATININE 1.22 0.73 0.66 0.67 0.67  CALCIUM 9.5 9.6 9.6 8.7 8.5   Liver Function Tests:  Recent Labs Lab 08/12/14 1840 08/14/14 0329 08/18/14 0520  AST 10 10 8   ALT 7 7 6   ALKPHOS 169* 153* 125*  BILITOT 0.3 0.2* 0.3  PROT 7.9 6.6 6.9  ALBUMIN 2.7* 2.0* 2.2*    Recent Labs Lab 08/12/14 1840  LIPASE 8*    Recent Labs Lab 08/18/14 1420  AMMONIA 39   CBC:  Recent Labs Lab 08/12/14 1840 08/13/14 0235 08/14/14 0329 08/15/14 0325 08/18/14 0520 08/19/14 0358  WBC 23.8* 30.0* 29.1* 28.6* 24.9* 27.6*  NEUTROABS 20.6*  --   --   --   --  19.6*  HGB 8.8* 8.5* 7.4* 7.6*  7.8* 7.5*  HCT 27.8* 26.8* 23.0* 23.1* 23.7* 22.9*  MCV 81.8 81.5 78.5 76.7* 77.2* 80.1  PLT 425* 420* 416* 477* 501* 515*   Cardiac Enzymes: No results for input(s): CKTOTAL, CKMB, CKMBINDEX, TROPONINI in the last 168 hours. BNP: Invalid input(s): POCBNP CBG:  Recent Labs Lab 08/18/14 1711 08/18/14 2309 08/19/14 0749 08/19/14 1148 08/19/14 1636  GLUCAP 209* 203* 297* 214* 195*    Recent Results (from the past 240 hour(s))  Blood Culture (routine x 2)     Status: None   Collection Time: 08/12/14  6:40 PM  Result Value Ref Range Status   Specimen Description BLOOD RIGHT [facility]  Final   Special Requests BOTTLES DRAWN AEROBIC AND ANAEROBIC 5CC EACH  Final   Culture  Setup Time   Final    08/13/2014 01:00 Performed at [facility]    Culture   Final    STAPHYLOCOCCUS AUREUS Note: SUSCEPTIBILITIES PERFORMED ON PREVIOUS CULTURE WITHIN THE LAST 5 DAYS. Note: Gram Stain Report Called to,Read Back By and Verified With: [facility] WORK RN [phone] Performed at [facility]    Report Status 08/16/2014 FINAL  Final  Urine culture     Status: None   Collection Time: 08/12/14  6:40 PM  Result Value Ref Range Status   Specimen Description URINE, CATHETERIZED  Final   Special Requests NONE  Final   Culture  Setup Time   Final    08/13/2014 00:43 Performed at [place]   Final    >=100,000 COLONIES/ML Performed at [facility]    Culture   Final    KLEBSIELLA PNEUMONIAE Performed at [facility]    Report Status 08/16/2014 FINAL  Final   Organism ID, Bacteria KLEBSIELLA PNEUMONIAE  Final      Susceptibility   Klebsiella pneumoniae - MIC*    AMPICILLIN >=32 RESISTANT Resistant     CEFAZOLIN <=4 SENSITIVE Sensitive     CEFTRIAXONE <=1 SENSITIVE Sensitive     CIPROFLOXACIN 0.5 SENSITIVE Sensitive     GENTAMICIN <=1 SENSITIVE Sensitive     LEVOFLOXACIN 2 SENSITIVE Sensitive     NITROFURANTOIN 64 INTERMEDIATE Intermediate      TOBRAMYCIN <=1 SENSITIVE Sensitive     TRIMETH/SULFA <=20 SENSITIVE Sensitive     PIP/TAZO 32 INTERMEDIATE Intermediate     * KLEBSIELLA PNEUMONIAE  Blood Culture (routine x 2)     Status: None   Collection Time: 08/12/14  6:48 PM  Result Value Ref Range Status   Specimen Description BLOOD RIGHT [place]  Final   Special Requests BOTTLES DRAWN AEROBIC AND ANAEROBIC 10CC EACH  Final   Culture  Setup Time   Final    08/13/2014 00:59 Performed at [facility]    Culture   Final    STAPHYLOCOCCUS AUREUS Note: RIFAMPIN AND GENTAMICIN SHOULD NOT BE USED AS SINGLE DRUGS FOR TREATMENT OF STAPH INFECTIONS. ENTEROCOCCUS SPECIES Note: COMBINATION THERAPY OF HIGH DOSE AMPICILLIN OR VANCOMYCIN, PLUS AN AMINOGLYCOSIDE, IS USUALLY INDICATED FOR SERIOUS ENTEROCOCCAL INFECTIONS. Note: Gram Stain Report Called to,Read Back By and Verified With: Paula Compton RN 700P    Report Status 08/16/2014 FINAL  Final   Organism ID, Bacteria STAPHYLOCOCCUS AUREUS  Final   Organism ID, Bacteria ENTEROCOCCUS SPECIES  Final      Susceptibility   Staphylococcus aureus - MIC*    CLINDAMYCIN >=8 RESISTANT Resistant     ERYTHROMYCIN >=8 RESISTANT Resistant     GENTAMICIN <=0.5 SENSITIVE Sensitive     LEVOFLOXACIN 0.25 SENSITIVE Sensitive     OXACILLIN 1 SENSITIVE Sensitive     PENICILLIN >=0.5 RESISTANT Resistant     RIFAMPIN <=0.5 SENSITIVE Sensitive     TRIMETH/SULFA 20 SENSITIVE Sensitive     VANCOMYCIN <=0.5 SENSITIVE Sensitive     TETRACYCLINE >=16 RESISTANT Resistant     MOXIFLOXACIN* <=0.25 SENSITIVE Sensitive      * SET UP TIME:  [id number]    * STAPHYLOCOCCUS AUREUS   Enterococcus species - MIC*    AMPICILLIN <=2 SENSITIVE Sensitive     VANCOMYCIN 1 SENSITIVE Sensitive     * ENTEROCOCCUS SPECIES  MRSA PCR Screening     Status: None   Collection Time: 08/13/14 12:43 AM  Result Value Ref Range Status   MRSA by PCR NEGATIVE NEGATIVE Final    Comment:        The GeneXpert MRSA Assay  (FDA approved for NASAL specimens only), is one component of a comprehensive MRSA colonization surveillance program. It is not intended to diagnose MRSA infection nor to guide or monitor treatment for MRSA infections.   Culture, respiratory (NON-Expectorated)     Status: None   Collection Time: 08/13/14 11:52 AM  Result Value Ref Range Status   Specimen Description TRACHEAL ASPIRATE  Final   Special Requests NONE  Final   Gram Stain   Final    FEW WBC PRESENT,BOTH PMN AND MONONUCLEAR RARE SQUAMOUS EPITHELIAL CELLS PRESENT FEW GRAM POSITIVE COCCI IN PAIRS IN CLUSTERS RARE GRAM NEGATIVE RODS Performed at [facility]    Culture   Final    ABUNDANT STAPHYLOCOCCUS AUREUS Note: RIFAMPIN AND GENTAMICIN SHOULD NOT BE USED AS SINGLE DRUGS FOR TREATMENT OF STAPH INFECTIONS. Performed at [facility]    Report Status 08/16/2014 FINAL  Final   Organism ID, Bacteria STAPHYLOCOCCUS AUREUS  Final      Susceptibility   Staphylococcus aureus - MIC*    CLINDAMYCIN >=8 RESISTANT Resistant     ERYTHROMYCIN >=8 RESISTANT Resistant     GENTAMICIN <=0.5 SENSITIVE Sensitive     LEVOFLOXACIN 0.25 SENSITIVE Sensitive     OXACILLIN 1 SENSITIVE Sensitive     PENICILLIN >=0.5 RESISTANT Resistant     RIFAMPIN <=0.5 SENSITIVE Sensitive     TRIMETH/SULFA <=10 SENSITIVE Sensitive     VANCOMYCIN 1 SENSITIVE Sensitive     TETRACYCLINE >=16 RESISTANT Resistant     MOXIFLOXACIN <=0.25 SENSITIVE Sensitive     * ABUNDANT STAPHYLOCOCCUS AUREUS  Culture, blood (routine x 2)     Status: None (Preliminary result)   Collection Time: 08/14/14  3:20 PM  Result Value Ref Range Status   Specimen Description BLOOD RIGHT  ARM  Final   Special Requests BOTTLES DRAWN AEROBIC ONLY [place]  Final   Culture  Setup Time   Final    08/15/2014 00:57 Performed at [facility]    Culture   Final           BLOOD CULTURE RECEIVED NO GROWTH TO DATE CULTURE WILL BE HELD FOR 5 DAYS BEFORE ISSUING A FINAL  NEGATIVE REPORT Performed at [facility]    Report Status PENDING  Incomplete  Culture, blood (routine x 2)     Status: None (Preliminary result)   Collection Time: 08/14/14  3:30 PM  Result Value Ref Range Status   Specimen Description BLOOD RIGHT HAND  Final   Special Requests BOTTLES DRAWN AEROBIC ONLY 4CC  Final   Culture  Setup Time   Final    08/15/2014 00:57 Performed at [facility]    Culture   Final           BLOOD CULTURE RECEIVED NO GROWTH TO DATE CULTURE WILL BE HELD FOR 5 DAYS BEFORE ISSUING A FINAL NEGATIVE REPORT Performed at [facility]    Report Status PENDING  Incomplete  Clostridium Difficile by PCR     Status: None   Collection Time: 08/15/14  2:12 PM  Result Value Ref Range Status   C difficile by pcr NEGATIVE NEGATIVE Final     Scheduled Meds: . albuterol  2.5 mg Nebulization TID  . atorvastatin  20 mg Oral Daily  . budesonide  0.25 mg Nebulization Q6H  . collagenase   Topical Daily  . feeding supplement (GLUCERNA SHAKE)  237 mL Oral TID BM  . feeding supplement (PRO-STAT SUGAR FREE 64)  30 mL Oral Daily  . heparin subcutaneous  5,000 Units Subcutaneous 3 times per day  . insulin aspart  0-15 Units Subcutaneous TID WC  . insulin glargine  15 Units Subcutaneous Daily  . morphine  15 mg Oral Q12H  . pantoprazole  40 mg Oral Q1200  . pregabalin  100 mg Oral TID  . sodium chloride  3 mL Intravenous Q12H  . vancomycin  750 mg Intravenous Q12H   Continuous Infusions:    Arick Mareno, DO  [facility] Pager [phone]  If 7PM-7AM, please contact night-coverage [email or web address] Password [email or web address] 08/19/2014, 4:45 PM   LOS: 7 days

## 2014-08-19 NOTE — Clinical Documentation Improvement (Signed)
Possible Clinical Conditions?  Severe Malnutrition   Protein Calorie Malnutrition Severe Protein Calorie Malnutrition Other Condition Cannot clinically determine  Supporting Information: ( As per RD note on 08-17-14) Pt meets criteria for SEVERE MALNUTRITION in the context of acute as evidenced by 8% weight loss x 3 weeks and intake of </= 50% of his estimated needs in >/= 5 days.   DOCUMENTATION CODES  Per approved criteria   -Severe malnutrition in the context of chronic illness   INTERVENTION:  Glucerna Shake po TID, each supplement provides 220 kcal and 10 grams of protein  30 ml Prostat daily     Thank You, Alessandra Grout, RN, BSN, CCDS,Clinical Documentation Specialist:  [phone]  [phone]=[facility]- Health Information Management

## 2014-08-19 NOTE — Progress Notes (Signed)
Physical Therapy Wound Treatment Patient Details  Name: QUANTAE MARTEL MRN: [id number] Date of Birth: 09/30/1986  Today's Date: 08/19/2014 Time: 7517-0017 Time Calculation (min): 51 min  Subjective     Pain Score: Pain Score: Asleep  Wound Assessment  Pressure Ulcer 08/13/14 Unstageable - Full thickness tissue loss in which the base of the ulcer is covered by slough (yellow, tan, gray, green or brown) and/or eschar (tan, brown or black) in the wound bed. 4cm x 3 cm  necrotic (Active)  Dressing Type Gauze (Comment);Moist to dry;ABD 08/19/2014 11:00 AM  Dressing Clean;Dry;Intact 08/19/2014 11:00 AM  Dressing Change Frequency Daily 08/19/2014 11:00 AM  State of Healing Eschar 08/19/2014 11:00 AM  Site / Wound Assessment Clean;Dry 08/19/2014 11:00 AM  % Wound base Red or Granulating 0% 08/19/2014 11:00 AM  % Wound base Yellow 100% 08/19/2014 11:00 AM  % Wound base Black 0% 08/19/2014 11:00 AM  % Wound base Other (Comment) 0% 08/19/2014 11:00 AM  Peri-wound Assessment Intact;Pink 08/19/2014 11:00 AM  Wound Length (cm) 4 cm 08/19/2014 11:00 AM  Wound Width (cm) 3.5 cm 08/19/2014 11:00 AM  Wound Depth (cm) 3 cm 08/19/2014 11:00 AM  Drainage Amount Scant 08/19/2014 11:00 AM  Drainage Description Serosanguineous 08/19/2014 11:00 AM  Treatment Cleansed;Debridement (Selective);Hydrotherapy (Pulse lavage);Packing (Saline gauze) 08/19/2014 11:00 AM     Pressure Ulcer 08/13/14 Stage II -  Partial thickness loss of dermis presenting as a shallow open ulcer with a red, pink wound bed without slough. 2cm x 3 cm necrotic (Active)  Dressing Type Foam 08/18/2014  8:10 PM  Dressing Clean;Dry;Intact 08/18/2014  8:10 PM  Dressing Change Frequency PRN 08/18/2014  8:10 PM  State of Healing Non-healing 08/17/2014  7:50 AM  Site / Wound Assessment Clean;Dry;Pink 08/17/2014  7:50 AM  % Wound base Black 20% 08/13/2014  8:00 AM  Peri-wound Assessment Intact;Pink 08/16/2014  3:27 PM  Drainage Amount None  08/19/2014  8:56 AM  Drainage Description Serous 08/19/2014  8:56 AM  Treatment Other (Comment) 08/15/2014 11:00 AM   Hydrotherapy Pulsed lavage therapy - wound location: R ischium Pulsed Lavage with Suction (psi): 8 psi (to 12) Pulsed Lavage with Suction - Normal Saline Used: 1000 mL Pulsed Lavage Tip: Tip with splash shield Selective Debridement Selective Debridement - Location: R ischium Selective Debridement - Tools Used: Forceps;Scalpel Selective Debridement - Tissue Removed: necrotic tissues/ eschar   Wound Assessment and Plan  Wound Therapy - Assess/Plan/Recommendations Wound Therapy - Clinical Statement: pt's wound will benefit from PLS to help decrease bacterial load soften tissues in prep for selective debridement to get down to healthy tissues Wound Therapy - Functional Problem List: quad,  Factors Delaying/Impairing Wound Healing: Diabetes Mellitus;Immobility;Tobacco use Hydrotherapy Plan: Debridement;Dressing change;Patient/family education;Pulsatile lavage with suction Wound Therapy - Frequency: 6X / week Wound Therapy - Follow Up Recommendations: Wound Care Center;Home health RN Wound Plan: see above  Wound Therapy Goals- Improve the function of patient's integumentary system by progressing the wound(s) through the phases of wound healing (inflammation - proliferation - remodeling) by: Decrease Necrotic Tissue to: 10% Decrease Necrotic Tissue - Progress: Goal set today Increase Granulation Tissue to: 90% Increase Granulation Tissue - Progress: Goal set today  Goals will be updated until maximal potential achieved or discharge criteria met.  Discharge criteria: when goals achieved, discharge from hospital, MD decision/surgical intervention, no progress towards goals, refusal/missing three consecutive treatments without notification or medical reason.  GP     Tersea Aulds, [name] 08/19/2014, 11:37 AM 08/19/2014  Donnella Sham, [place] [phone]   (  pager)

## 2014-08-19 NOTE — Progress Notes (Signed)
Chest vest not done this am due to pt. wanting to eat then gave nebulizer before receiving Hydrotherapy, started Flutter @1220p  prior to pt. Being transported to [place], has used before, can use as needed between scheduled CPT via vest, after nebulizer tx.'s.

## 2014-08-19 NOTE — Progress Notes (Signed)
[facility]   Day # 7 vancomycin   Subjective: Had fever last night   Antibiotics:  Anti-infectives    Start     Dose/Rate Route Frequency Ordered Stop   08/15/14 1100  vancomycin (VANCOCIN) IVPB 750 mg/150 ml premix     750 mg150 mL/hr over 60 Minutes Intravenous Every 12 hours 08/15/14 1031     08/13/14 2000  vancomycin (VANCOCIN) IVPB 750 mg/150 ml premix  Status:  Discontinued     750 mg150 mL/hr over 60 Minutes Intravenous Every 24 hours 08/13/14 0235 08/13/14 0750   08/13/14 1400  aztreonam (AZACTAM) 1 g in dextrose 5 % 50 mL IVPB  Status:  Discontinued     1 g100 mL/hr over 30 Minutes Intravenous 3 times per day 08/13/14 0646 08/14/14 1254   08/13/14 1000  vancomycin (VANCOCIN) 500 mg in sodium chloride 0.9 % 100 mL IVPB  Status:  Discontinued     500 mg100 mL/hr over 60 Minutes Intravenous Every 12 hours 08/13/14 0750 08/15/14 1031   08/13/14 0800  levofloxacin (LEVAQUIN) IVPB 750 mg  Status:  Discontinued     750 mg100 mL/hr over 90 Minutes Intravenous Every 24 hours 08/13/14 0639 08/13/14 1108   08/13/14 0400  fluconazole (DIFLUCAN) IVPB 200 mg  Status:  Discontinued     200 mg100 mL/hr over 60 Minutes Intravenous Every 24 hours 08/13/14 0148 08/13/14 1108   08/13/14 0245  meropenem (MERREM) 1 g in sodium chloride 0.9 % 100 mL IVPB  Status:  Discontinued     1 g200 mL/hr over 30 Minutes Intravenous Every 12 hours 08/13/14 0234 08/13/14 0639   08/12/14 1945  vancomycin (VANCOCIN) IVPB 1000 mg/200 mL premix     1,000 mg200 mL/hr over 60 Minutes Intravenous  Once 08/12/14 1935 08/12/14 2140   08/12/14 1945  imipenem-cilastatin (PRIMAXIN) 500 mg in sodium chloride 0.9 % 100 mL IVPB     500 mg200 mL/hr over 30 Minutes Intravenous  Once 08/12/14 1937 08/12/14 2036      Medications: Scheduled Meds: . albuterol  2.5 mg Nebulization TID  . atorvastatin  20 mg Oral Daily  . budesonide  0.25 mg Nebulization Q6H  . collagenase   Topical Daily  .  feeding supplement (GLUCERNA SHAKE)  237 mL Oral TID BM  . feeding supplement (PRO-STAT SUGAR FREE 64)  30 mL Oral Daily  . heparin subcutaneous  5,000 Units Subcutaneous 3 times per day  . insulin aspart  0-15 Units Subcutaneous TID WC  . insulin glargine  15 Units Subcutaneous Daily  . morphine  15 mg Oral Q12H  . pantoprazole  40 mg Oral Q1200  . pregabalin  100 mg Oral TID  . sodium chloride  3 mL Intravenous Q12H  . vancomycin  750 mg Intravenous Q12H   Continuous Infusions:   PRN Meds:.acetaminophen, LORazepam, methocarbamol, ondansetron **OR** ondansetron (ZOFRAN) IV, oxyCODONE-acetaminophen, traZODone    Objective: Weight change:   Intake/Output Summary (Last 24 hours) at 08/19/14 1048 Last data filed at 08/19/14 0907  Gross per 24 hour  Intake    960 ml  Output   2801 ml  Net  -1841 ml   Blood pressure 131/70, pulse 96, temperature 101.4 F (38.6 C), temperature source Oral, resp. rate 22, height 5\' 8"  (1.727 m), weight 129 lb 6.6 oz (58.7 kg), SpO2 100 %. Temp:  [99.5 F (37.5 C)-101.4 F (38.6 C)] 101.4 F (38.6 C) (11/12 0516) Pulse Rate:  [96-103] 96 (11/12 0516) Resp:  [  22-28] 22 (11/12 0516) BP: (99-133)/(56-85) 131/70 mmHg (11/12 0516) SpO2:  [83 %-100 %] 100 % (11/12 0516)  Physical Exam: General: Alert and awake, oriented x3, not in any acute distress. HEENT: anicteric sclera,, EOMI CVS regular rate, normal r,  no murmur rubs or gallops Chest: clear to auscultation bilaterally, no wheezing, rales or rhonchi Abdomen: soft nontender, nondistended, Extremities: contractures: he has decubitus ulcers see pictures previously He has area on left foot where blister had burst   CBC:  CBC Latest Ref Rng 08/19/2014 08/18/2014 08/15/2014  WBC 4.0 - 10.5 K/uL 27.6(H) 24.9(H) 28.6(H)  Hemoglobin 13.0 - 17.0 g/dL 7.5(L) 7.8(L) 7.6(L)  Hematocrit 39.0 - 52.0 % 22.9(L) 23.7(L) 23.1(L)  Platelets 150 - 400 K/uL 515(H) 501(H) 477(H)       BMET  Recent  Labs  08/18/14 0520 08/19/14 0358  NA 136* 135*  K 3.5* 4.2  CL 101 100  CO2 22 22  GLUCOSE 143* 341*  BUN 9 9  CREATININE 0.67 0.67  CALCIUM 8.7 8.5     Liver Panel   Recent Labs  08/18/14 0520  PROT 6.9  ALBUMIN 2.2*  AST 8  ALT 6  ALKPHOS 125*  BILITOT 0.3       Sedimentation Rate No results for input(s): ESRSEDRATE in the last 72 hours. C-Reactive Protein No results for input(s): CRP in the last 72 hours.  Micro Results: Recent Results (from the past 720 hour(s))  MRSA PCR Screening     Status: None   Collection Time: 07/30/14  6:35 AM  Result Value Ref Range Status   MRSA by PCR NEGATIVE NEGATIVE Final    Comment:        The GeneXpert MRSA Assay (FDA approved for NASAL specimens only), is one component of a comprehensive MRSA colonization surveillance program. It is not intended to diagnose MRSA infection nor to guide or monitor treatment for MRSA infections.  Blood Culture (routine x 2)     Status: None   Collection Time: 08/12/14  6:40 PM  Result Value Ref Range Status   Specimen Description BLOOD RIGHT [facility]  Final   Special Requests BOTTLES DRAWN AEROBIC AND ANAEROBIC 5CC EACH  Final   Culture  Setup Time   Final    08/13/2014 01:00 Performed at [facility]    Culture   Final    STAPHYLOCOCCUS AUREUS Note: SUSCEPTIBILITIES PERFORMED ON PREVIOUS CULTURE WITHIN THE LAST 5 DAYS. Note: Gram Stain Report Called to,Read Back By and Verified With: [facility] WORK RN [phone] Performed at [facility]    Report Status 08/16/2014 FINAL  Final  Urine culture     Status: None   Collection Time: 08/12/14  6:40 PM  Result Value Ref Range Status   Specimen Description URINE, CATHETERIZED  Final   Special Requests NONE  Final   Culture  Setup Time   Final    08/13/2014 00:43 Performed at [place]   Final    >=100,000 COLONIES/ML Performed at [place] Performed at [facility]    Report Status 08/16/2014 FINAL  Final   Organism ID, Bacteria KLEBSIELLA PNEUMONIAE  Final      Susceptibility   Klebsiella pneumoniae - MIC*    AMPICILLIN >=32 RESISTANT Resistant     CEFAZOLIN <=4 SENSITIVE Sensitive     CEFTRIAXONE <=1 SENSITIVE Sensitive     CIPROFLOXACIN 0.5 SENSITIVE Sensitive  GENTAMICIN <=1 SENSITIVE Sensitive     LEVOFLOXACIN 2 SENSITIVE Sensitive     NITROFURANTOIN 64 INTERMEDIATE Intermediate     TOBRAMYCIN <=1 SENSITIVE Sensitive     TRIMETH/SULFA <=20 SENSITIVE Sensitive     PIP/TAZO 32 INTERMEDIATE Intermediate     * KLEBSIELLA PNEUMONIAE  Blood Culture (routine x 2)     Status: None   Collection Time: 08/12/14  6:48 PM  Result Value Ref Range Status   Specimen Description BLOOD RIGHT WRIST  Final   Special Requests BOTTLES DRAWN AEROBIC AND ANAEROBIC 10CC EACH  Final   Culture  Setup Time   Final    08/13/2014 00:59 Performed at [facility]    Culture   Final    STAPHYLOCOCCUS AUREUS Note: RIFAMPIN AND GENTAMICIN SHOULD NOT BE USED AS SINGLE DRUGS FOR TREATMENT OF STAPH INFECTIONS. ENTEROCOCCUS SPECIES Note: COMBINATION THERAPY OF HIGH DOSE AMPICILLIN OR VANCOMYCIN, PLUS AN AMINOGLYCOSIDE, IS USUALLY INDICATED FOR SERIOUS ENTEROCOCCAL INFECTIONS. Note: Gram Stain Report Called to,Read Back By and Verified With: Paula Compton RN 700P    Report Status 08/16/2014 FINAL  Final   Organism ID, Bacteria STAPHYLOCOCCUS AUREUS  Final   Organism ID, Bacteria ENTEROCOCCUS SPECIES  Final      Susceptibility   Staphylococcus aureus - MIC*    CLINDAMYCIN >=8 RESISTANT Resistant     ERYTHROMYCIN >=8 RESISTANT Resistant     GENTAMICIN <=0.5 SENSITIVE Sensitive     LEVOFLOXACIN 0.25 SENSITIVE Sensitive     OXACILLIN 1 SENSITIVE Sensitive     PENICILLIN >=0.5 RESISTANT Resistant     RIFAMPIN <=0.5 SENSITIVE Sensitive     TRIMETH/SULFA 20 SENSITIVE Sensitive     VANCOMYCIN <=0.5 SENSITIVE Sensitive      TETRACYCLINE >=16 RESISTANT Resistant     MOXIFLOXACIN* <=0.25 SENSITIVE Sensitive      * SET UP TIME:  [id number]    * STAPHYLOCOCCUS AUREUS   Enterococcus species - MIC*    AMPICILLIN <=2 SENSITIVE Sensitive     VANCOMYCIN 1 SENSITIVE Sensitive     * ENTEROCOCCUS SPECIES  MRSA PCR Screening     Status: None   Collection Time: 08/13/14 12:43 AM  Result Value Ref Range Status   MRSA by PCR NEGATIVE NEGATIVE Final    Comment:        The GeneXpert MRSA Assay (FDA approved for NASAL specimens only), is one component of a comprehensive MRSA colonization surveillance program. It is not intended to diagnose MRSA infection nor to guide or monitor treatment for MRSA infections.   Culture, respiratory (NON-Expectorated)     Status: None   Collection Time: 08/13/14 11:52 AM  Result Value Ref Range Status   Specimen Description TRACHEAL ASPIRATE  Final   Special Requests NONE  Final   Gram Stain   Final    FEW WBC PRESENT,BOTH PMN AND MONONUCLEAR RARE SQUAMOUS EPITHELIAL CELLS PRESENT FEW GRAM POSITIVE COCCI IN PAIRS IN CLUSTERS RARE GRAM NEGATIVE RODS Performed at [facility]    Culture   Final    ABUNDANT STAPHYLOCOCCUS AUREUS Note: RIFAMPIN AND GENTAMICIN SHOULD NOT BE USED AS SINGLE DRUGS FOR TREATMENT OF STAPH INFECTIONS. Performed at [facility]    Report Status 08/16/2014 FINAL  Final   Organism ID, Bacteria STAPHYLOCOCCUS AUREUS  Final      Susceptibility   Staphylococcus aureus - MIC*    CLINDAMYCIN >=8 RESISTANT Resistant     ERYTHROMYCIN >=8 RESISTANT Resistant     GENTAMICIN <=0.5 SENSITIVE Sensitive  LEVOFLOXACIN 0.25 SENSITIVE Sensitive     OXACILLIN 1 SENSITIVE Sensitive     PENICILLIN >=0.5 RESISTANT Resistant     RIFAMPIN <=0.5 SENSITIVE Sensitive     TRIMETH/SULFA <=10 SENSITIVE Sensitive     VANCOMYCIN 1 SENSITIVE Sensitive     TETRACYCLINE >=16 RESISTANT Resistant     MOXIFLOXACIN <=0.25 SENSITIVE Sensitive     * ABUNDANT  STAPHYLOCOCCUS AUREUS  Culture, blood (routine x 2)     Status: None (Preliminary result)   Collection Time: 08/14/14  3:20 PM  Result Value Ref Range Status   Specimen Description BLOOD RIGHT ARM  Final   Special Requests BOTTLES DRAWN AEROBIC ONLY 6CC  Final   Culture  Setup Time   Final    08/15/2014 00:57 Performed at [facility]    Culture   Final           BLOOD CULTURE RECEIVED NO GROWTH TO DATE CULTURE WILL BE HELD FOR 5 DAYS BEFORE ISSUING A FINAL NEGATIVE REPORT Performed at [facility]    Report Status PENDING  Incomplete  Culture, blood (routine x 2)     Status: None (Preliminary result)   Collection Time: 08/14/14  3:30 PM  Result Value Ref Range Status   Specimen Description BLOOD RIGHT HAND  Final   Special Requests BOTTLES DRAWN AEROBIC ONLY 4CC  Final   Culture  Setup Time   Final    08/15/2014 00:57 Performed at [facility]    Culture   Final           BLOOD CULTURE RECEIVED NO GROWTH TO DATE CULTURE WILL BE HELD FOR 5 DAYS BEFORE ISSUING A FINAL NEGATIVE REPORT Performed at [facility]    Report Status PENDING  Incomplete  Clostridium Difficile by PCR     Status: None   Collection Time: 08/15/14  2:12 PM  Result Value Ref Range Status   C difficile by pcr NEGATIVE NEGATIVE Final    Studies/Results: Mr Kizzie Fantasia Contrast  08/17/2014   CLINICAL DATA:  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 Confusion with fevers and sepsis. Assess for CNS infection.  EXAM: MRI HEAD WITHOUT AND WITH CONTRAST  TECHNIQUE: Multiplanar, multiecho pulse sequences of the brain and surrounding structures were obtained without and with intravenous contrast.  CONTRAST:  73mL MULTIHANCE GADOBENATE DIMEGLUMINE 529 MG/ML IV SOLN  COMPARISON:  Head CT 07/29/2014.  MRI 01/29/2014.  FINDINGS: The brainstem and cerebellum are normal. The cerebral hemispheres again show a few foci of chronic T2 signal within the white matter, possibly related to old shear  injuries. Newly seen is edema in the splenium of the corpus callosum, more on the left than the right. This is associated with low level restricted diffusion. There is no contrast enhancement. No other active pathologic finding. After contrast administration, there is no abnormal enhancement of the brain or leptomeninges, including of the splenium of the corpus callosum  No evidence of mass lesion, hemorrhage, hydrocephalus or extra-axial collection. No pituitary mass. Sinuses are clear.  IMPRESSION: No evidence of intracranial abscess. The patient does have the new finding of edema signal within the splenium of the corpus callosum. The differential diagnosis includes the presenting lesion of multiple sclerosis, demyelination secondary to vitamin B12 deficiency (Marchiafava-Bignami disease), infarction due to hypoxia, and reversible lesions of the splenium associated with various infectious agents including influenza, rotavirus, E coli, and adenovirus. Other transient lesions can be caused by seizure, posterior reversible encephalopathy and hypoglycemia.   Electronically Signed  By: Nelson Chimes M.D.   On: 08/17/2014 19:26   Mr Hip Right W Wo Contrast  08/17/2014   CLINICAL DATA:  Quadriplegic, status post cord infarct, evaluate for osteomyelitis of the right hip from decubitus ulcer  EXAM: MRI PELVIS WITHOUT AND WITH CONTRAST  TECHNIQUE: Multiplanar multisequence MR imaging of the pelvis was performed both before and after administration of intravenous contrast.  CONTRAST:  2mL MULTIHANCE GADOBENATE DIMEGLUMINE 529 MG/ML IV SOLN  COMPARISON:  None.  FINDINGS: There is no focal marrow signal abnormality of bilateral hips. There is no fracture or dislocation. There is no avascular necrosis. Bilateral sacroiliac joints are normal. There is no joint effusion. There is no gross labral or paralabral abnormality.  There is a decubitus ulcer inferior to the right ischial tuberosity with a small amount air within the  ulcer versus packing material. There is a mildly complex fluid collection extending from the decubitus ulcer to the posterior aspect of the right inferior pubic ramus measuring 1.8 x 1.6 x 3.7 cm. There is no definite cortical destruction. There is mild increased T2 signal within the posterior aspect of the right inferior pubic ramus just medial to the ischial tuberosity, which may be reactive versus early osteomyelitis.  There is diffuse mild increased T2 signal throughout the proximal thigh and pelvic musculature which is likely neurogenic.  There is severe bladder wall thickening with a Foley catheter present. The bladder wall thickening is likely secondary to neurogenic bladder.  IMPRESSION: 1. There is a decubitus ulcer inferior to the right ischial tuberosity with a small amount air within the ulcer versus packing material. There is a mildly complex fluid collection extending from the decubitus ulcer to the posterior aspect of the right inferior pubic ramus measuring 1.8 x 1.6 x 3.7 cm without definite cortical destruction. There is mild increased T2 signal within the posterior aspect of the right inferior pubic ramus just medial to the ischial tuberosity, which may be reactive versus early osteomyelitis.   Electronically Signed   By: Kathreen Devoid   On: 08/17/2014 20:36      Assessment/Plan:  Principal Problem:   Staphylococcus aureus bacteremia Active Problems:   Type 1 diabetes, uncontrolled, with neuropathy   Asthma   GERD   Spinal cord infarction (history of)   Acute encephalopathy   Pneumonia   Sepsis   Acute respiratory failure with hypercapnia   UTI (lower urinary tract infection)   Healthcare-associated pneumonia   Renal failure (ARF), acute on chronic   Sacral pressure sore   Severe sepsis   Bacteremia   Osteomyelitis hip   Stage IV decubitus ulcer    Jason [name] is a 28 y.o. male with  Hx of paraplegia spinal cord infarct with MSSA and Enterococcus (latter in only  1/2 blood cultures) without clear source other than open wounds   #1 Staphylococcus Aureus bacteremia       [place] [facility] Staphylococcus aureus bacteremia   Staphylococcus aureus bacteremia (SAB) is associated with a high rate of complications and mortality.  Specific aspects of clinical management are critical to optimizing the outcome of patients with SAB.  Therefore, the [facility] Antimicrobial Management Team [facility]) has initiated an intervention aimed at improving the management of SAB at [facility].  To do so, Infectious Diseases physicians are providing an evidence-based consult for the management of all patients with SAB.     Yes No Comments  Perform follow-up blood cultures (even if the patient is afebrile) to  ensure clearance of bacteremia [x]  []  08/14/14 BLOOD CULTURES COOKING NGTD  Remove vascular catheter and obtain follow-up blood cultures after the removal of the catheter []  []  DO NOT PLACE PICC UNTIL WE ARE 3-4 DAYS OUT FROM NEGATIVE BLOOD CULTURES  Perform echocardiography to evaluate for endocarditis (transthoracic ECHO is 40-50% sensitive, TEE is > 90% sensitive) []  []   TEE NEGATIVE FOR VEGETATIONS  Consult electrophysiologist to evaluate implanted cardiac device (pacemaker, ICD) []  []  NA  Ensure source control []  []  Have all abscesses been drained effectively? Have deep seeded infections (septic joints or osteomyelitis) had appropriate surgical debridement?  MRI RIGHT hip and pelvis suggest area under his eschar with fluid/necrotic debris next to an area of possible early osteomyelitis  I WOULD FAVOR GOING FOR SURGERY HERE GIVEN PERSISTENT LEUKOCYTOSIS, NOW FEVERS AND TO ENSURE SOURCE CONTROL, WE DEFINITELY DO NOT WANT HIM HAVING RECURRENCE OF HIS BACTEREMIA OR WORSENING OSTEO IF HE INDEED HAS THIS  Investigate for "metastatic" sites of infection []  []  Does the patient have ANY symptom or physical exam finding that would suggest a deeper  infection (back or neck pain that may be suggestive of vertebral osteomyelitis or epidural abscess, muscle pain that could be a symptom of pyomyositis)?  Keep in mind that for deep seeded infections MRI imaging with contrast is preferred rather than other often insensitive tests such as plain x-rays, especially early in a patient's presentation. SEE ABOVE DISCUSSION re his HIP.  SURGERY IS TRYING HYDROTHERAPY BUT IF PT HAS PERSISTENT FEVERS I WOULD PUSH TO TAKE HIM FOR i AND D OF SITE TO ENSURE SOURCE CONTROL  Change antibiotic therapy to VANCOMYCIN GIVEN HIS PCN ALLEery per Mom he has anaphylaxis []  []  Beta-lactam antibiotics are preferred for MSSA due to higher cure rates.   If on Vancomycin, goal trough should be 15 - 20 mcg/mL  Estimated duration of IV antibiotic therapy:    8 WEEKS WITH CONSIDERATION  TO REPEAT MRI HIP PRIOR TO STOPPING THERAPY  []  []  Consult case management for probably prolonged outpatient IV antibiotic therapy   #2 Enterococcus growing from 1/2 cultures on admission, LIKELY A  contaminant,   #3 Fevers: As above I worry about his fluid collection and would vote for I and D given persistent fevers and high wbc. I will check a CXR as well to ensure his pleural effusion not larger. If it is worsening would consider diagnostic and therapeutic thoracocentesis     LOS: 7 days   [name] 08/19/2014, 10:48 AM

## 2014-08-20 ENCOUNTER — Encounter: Payer: Medicaid Other | Admitting: Occupational Therapy

## 2014-08-20 ENCOUNTER — Ambulatory Visit: Payer: Medicaid Other

## 2014-08-20 DIAGNOSIS — A4101 Sepsis due to Methicillin susceptible Staphylococcus aureus: Principal | ICD-10-CM

## 2014-08-20 LAB — CBC
HEMATOCRIT: 21.3 % — AB (ref 39.0–52.0)
HEMOGLOBIN: 7 g/dL — AB (ref 13.0–17.0)
MCH: 26.2 pg (ref 26.0–34.0)
MCHC: 32.9 g/dL (ref 30.0–36.0)
MCV: 79.8 fL (ref 78.0–100.0)
Platelets: 501 10*3/uL — ABNORMAL HIGH (ref 150–400)
RBC: 2.67 MIL/uL — ABNORMAL LOW (ref 4.22–5.81)
RDW: 16.2 % — ABNORMAL HIGH (ref 11.5–15.5)
WBC: 21.1 10*3/uL — AB (ref 4.0–10.5)

## 2014-08-20 LAB — GLUCOSE, CAPILLARY
GLUCOSE-CAPILLARY: 259 mg/dL — AB (ref 70–99)
GLUCOSE-CAPILLARY: 136 mg/dL — AB (ref 70–99)
GLUCOSE-CAPILLARY: 57 mg/dL — AB (ref 70–99)
GLUCOSE-CAPILLARY: 260 mg/dL — AB (ref 70–99)
Glucose-Capillary: 143 mg/dL — ABNORMAL HIGH (ref 70–99)
Glucose-Capillary: 61 mg/dL — ABNORMAL LOW (ref 70–99)
Glucose-Capillary: 76 mg/dL (ref 70–99)

## 2014-08-20 LAB — BLOOD GAS, ARTERIAL
Acid-Base Excess: 2.5 mmol/L — ABNORMAL HIGH (ref 0.0–2.0)
BICARBONATE: 26.1 meq/L — AB (ref 20.0–24.0)
Drawn by: 225631
O2 Content: 2 L/min
O2 Saturation: 96.9 %
PATIENT TEMPERATURE: 98.6
TCO2: 27.3 mmol/L (ref 0–100)
pCO2 arterial: 37.5 mmHg (ref 35.0–45.0)
pH, Arterial: 7.458 — ABNORMAL HIGH (ref 7.350–7.450)
pO2, Arterial: 90.1 mmHg (ref 80.0–100.0)

## 2014-08-20 LAB — URINE CULTURE
Colony Count: NO GROWTH
Culture: NO GROWTH

## 2014-08-20 LAB — CLOSTRIDIUM DIFFICILE BY PCR: Toxigenic C. Difficile by PCR: NEGATIVE

## 2014-08-20 MED ORDER — MORPHINE SULFATE 2 MG/ML IJ SOLN
1.0000 mg | Freq: Once | INTRAMUSCULAR | Status: DC
  Filled 2014-08-20: qty 1

## 2014-08-20 NOTE — Progress Notes (Signed)
Pt status: In to check pt and pt woke up was talking to me and does not recall the rectal tube being placed, ABG or the MD in to speak with him. But he is clear A&O X4. Only c/o of being cold.

## 2014-08-20 NOTE — Progress Notes (Signed)
Jason [name] for Infectious Disease   Day # 8 vancomycin   Subjective: Eschar came off and he is had necrotic material there is been removed per wound care now with granulation tissue visible in wound   Antibiotics:  Anti-infectives    Start     Dose/Rate Route Frequency Ordered Stop   08/15/14 1100  vancomycin (VANCOCIN) IVPB 750 mg/150 ml premix     750 mg150 mL/hr over 60 Minutes Intravenous Every 12 hours 08/15/14 1031     08/13/14 2000  vancomycin (VANCOCIN) IVPB 750 mg/150 ml premix  Status:  Discontinued     750 mg150 mL/hr over 60 Minutes Intravenous Every 24 hours 08/13/14 0235 08/13/14 0750   08/13/14 1400  aztreonam (AZACTAM) 1 g in dextrose 5 % 50 mL IVPB  Status:  Discontinued     1 g100 mL/hr over 30 Minutes Intravenous 3 times per day 08/13/14 0646 08/14/14 1254   08/13/14 1000  vancomycin (VANCOCIN) 500 mg in sodium chloride 0.9 % 100 mL IVPB  Status:  Discontinued     500 mg100 mL/hr over 60 Minutes Intravenous Every 12 hours 08/13/14 0750 08/15/14 1031   08/13/14 0800  levofloxacin (LEVAQUIN) IVPB 750 mg  Status:  Discontinued     750 mg100 mL/hr over 90 Minutes Intravenous Every 24 hours 08/13/14 0639 08/13/14 1108   08/13/14 0400  fluconazole (DIFLUCAN) IVPB 200 mg  Status:  Discontinued     200 mg100 mL/hr over 60 Minutes Intravenous Every 24 hours 08/13/14 0148 08/13/14 1108   08/13/14 0245  meropenem (MERREM) 1 g in sodium chloride 0.9 % 100 mL IVPB  Status:  Discontinued     1 g200 mL/hr over 30 Minutes Intravenous Every 12 hours 08/13/14 0234 08/13/14 0639   08/12/14 1945  vancomycin (VANCOCIN) IVPB 1000 mg/200 mL premix     1,000 mg200 mL/hr over 60 Minutes Intravenous  Once 08/12/14 1935 08/12/14 2140   08/12/14 1945  imipenem-cilastatin (PRIMAXIN) 500 mg in sodium chloride 0.9 % 100 mL IVPB     500 mg200 mL/hr over 30 Minutes Intravenous  Once 08/12/14 1937 08/12/14 2036      Medications: Scheduled Meds: . albuterol  2.5 mg Nebulization TID  .  atorvastatin  20 mg Oral Daily  . budesonide  0.25 mg Nebulization Q6H  . collagenase   Topical Daily  . feeding supplement (GLUCERNA SHAKE)  237 mL Oral TID BM  . feeding supplement (PRO-STAT SUGAR FREE 64)  30 mL Oral Daily  . heparin subcutaneous  5,000 Units Subcutaneous 3 times per day  . insulin aspart  0-15 Units Subcutaneous TID WC  . insulin aspart  4 Units Subcutaneous TID WC  . insulin glargine  15 Units Subcutaneous Daily  . pantoprazole  40 mg Oral Q1200  . pregabalin  100 mg Oral TID  . sodium chloride  3 mL Intravenous Q12H  . vancomycin  750 mg Intravenous Q12H   Continuous Infusions:   PRN Meds:.acetaminophen, ondansetron **OR** ondansetron (ZOFRAN) IV, traZODone    Objective: Weight change:   Intake/Output Summary (Last 24 hours) at 08/20/14 1515 Last data filed at 08/20/14 1245  Gross per 24 hour  Intake    840 ml  Output   3550 ml  Net  -2710 ml   Blood pressure 108/88, pulse 88, temperature 99 F (37.2 C), temperature source Oral, resp. rate 20, height 5\' 8"  (1.727 m), weight 129 lb 6.6 oz (58.7 kg), SpO2 99 %. Temp:  [99 F (37.2  C)-101.3 F (38.5 C)] 99 F (37.2 C) (11/13 1349) Pulse Rate:  [85-91] 88 (11/13 1349) Resp:  [20-24] 20 (11/13 1349) BP: (108-133)/(66-93) 108/88 mmHg (11/13 1349) SpO2:  [96 %-100 %] 99 % (11/13 1452)  Physical Exam: General: Alert and awake, oriented x3, not in any acute distress. He is being turned in having his wound care applied  CBC:  CBC Latest Ref Rng 08/20/2014 08/19/2014 08/18/2014  WBC 4.0 - 10.5 K/uL 21.1(H) 27.6(H) 24.9(H)  Hemoglobin 13.0 - 17.0 g/dL 7.0(L) 7.5(L) 7.8(L)  Hematocrit 39.0 - 52.0 % 21.3(L) 22.9(L) 23.7(L)  Platelets 150 - 400 K/uL 501(H) 515(H) 501(H)       BMET  Recent Labs  08/18/14 0520 08/19/14 0358  NA 136* 135*  K 3.5* 4.2  CL 101 100  CO2 22 22  GLUCOSE 143* 341*  BUN 9 9  CREATININE 0.67 0.67  CALCIUM 8.7 8.5     Liver Panel   Recent Labs  08/18/14 0520    PROT 6.9  ALBUMIN 2.2*  AST 8  ALT 6  ALKPHOS 125*  BILITOT 0.3       Sedimentation Rate No results for input(s): ESRSEDRATE in the last 72 hours. C-Reactive Protein No results for input(s): CRP in the last 72 hours.  Micro Results: Recent Results (from the past 720 hour(s))  MRSA PCR Screening     Status: None   Collection Time: 07/30/14  6:35 AM  Result Value Ref Range Status   MRSA by PCR NEGATIVE NEGATIVE Final    Comment:        The GeneXpert MRSA Assay (FDA approved for NASAL specimens only), is one component of a comprehensive MRSA colonization surveillance program. It is not intended to diagnose MRSA infection nor to guide or monitor treatment for MRSA infections.  Blood Culture (routine x 2)     Status: None   Collection Time: 08/12/14  6:40 PM  Result Value Ref Range Status   Specimen Description BLOOD RIGHT [facility]  Final   Special Requests BOTTLES DRAWN AEROBIC AND ANAEROBIC 5CC EACH  Final   Culture  Setup Time   Final    08/13/2014 01:00 Performed at [facility]    Culture   Final    STAPHYLOCOCCUS AUREUS Note: SUSCEPTIBILITIES PERFORMED ON PREVIOUS CULTURE WITHIN THE LAST 5 DAYS. Note: Gram Stain Report Called to,Read Back By and Verified With: [facility] WORK RN [id number] Performed at [facility]    Report Status 08/16/2014 FINAL  Final  Urine culture     Status: None   Collection Time: 08/12/14  6:40 PM  Result Value Ref Range Status   Specimen Description URINE, CATHETERIZED  Final   Special Requests NONE  Final   Culture  Setup Time   Final    08/13/2014 00:43 Performed at [place]   Final    >=100,000 COLONIES/ML Performed at [place] Performed at [facility]    Report Status 08/16/2014 FINAL  Final   Organism ID, Bacteria KLEBSIELLA PNEUMONIAE  Final      Susceptibility   Klebsiella pneumoniae - MIC*    AMPICILLIN >=32 RESISTANT  Resistant     CEFAZOLIN <=4 SENSITIVE Sensitive     CEFTRIAXONE <=1 SENSITIVE Sensitive     CIPROFLOXACIN 0.5 SENSITIVE Sensitive     GENTAMICIN <=1 SENSITIVE Sensitive     LEVOFLOXACIN 2 SENSITIVE Sensitive  NITROFURANTOIN 64 INTERMEDIATE Intermediate     TOBRAMYCIN <=1 SENSITIVE Sensitive     TRIMETH/SULFA <=20 SENSITIVE Sensitive     PIP/TAZO 32 INTERMEDIATE Intermediate     * KLEBSIELLA PNEUMONIAE  Blood Culture (routine x 2)     Status: None   Collection Time: 08/12/14  6:48 PM  Result Value Ref Range Status   Specimen Description BLOOD RIGHT WRIST  Final   Special Requests BOTTLES DRAWN AEROBIC AND ANAEROBIC 10CC EACH  Final   Culture  Setup Time   Final    08/13/2014 00:59 Performed at [facility]    Culture   Final    STAPHYLOCOCCUS AUREUS Note: RIFAMPIN AND GENTAMICIN SHOULD NOT BE USED AS SINGLE DRUGS FOR TREATMENT OF STAPH INFECTIONS. ENTEROCOCCUS SPECIES Note: COMBINATION THERAPY OF HIGH DOSE AMPICILLIN OR VANCOMYCIN, PLUS AN AMINOGLYCOSIDE, IS USUALLY INDICATED FOR SERIOUS ENTEROCOCCAL INFECTIONS. Note: Gram Stain Report Called to,Read Back By and Verified With: Paula Compton RN 700P    Report Status 08/16/2014 FINAL  Final   Organism ID, Bacteria STAPHYLOCOCCUS AUREUS  Final   Organism ID, Bacteria ENTEROCOCCUS SPECIES  Final      Susceptibility   Staphylococcus aureus - MIC*    CLINDAMYCIN >=8 RESISTANT Resistant     ERYTHROMYCIN >=8 RESISTANT Resistant     GENTAMICIN <=0.5 SENSITIVE Sensitive     LEVOFLOXACIN 0.25 SENSITIVE Sensitive     OXACILLIN 1 SENSITIVE Sensitive     PENICILLIN >=0.5 RESISTANT Resistant     RIFAMPIN <=0.5 SENSITIVE Sensitive     TRIMETH/SULFA 20 SENSITIVE Sensitive     VANCOMYCIN <=0.5 SENSITIVE Sensitive     TETRACYCLINE >=16 RESISTANT Resistant     MOXIFLOXACIN* <=0.25 SENSITIVE Sensitive      * SET UP TIME:  [id number]    * STAPHYLOCOCCUS AUREUS   Enterococcus species - MIC*    AMPICILLIN <=2 SENSITIVE Sensitive      VANCOMYCIN 1 SENSITIVE Sensitive     * ENTEROCOCCUS SPECIES  MRSA PCR Screening     Status: None   Collection Time: 08/13/14 12:43 AM  Result Value Ref Range Status   MRSA by PCR NEGATIVE NEGATIVE Final    Comment:        The GeneXpert MRSA Assay (FDA approved for NASAL specimens only), is one component of a comprehensive MRSA colonization surveillance program. It is not intended to diagnose MRSA infection nor to guide or monitor treatment for MRSA infections.   Culture, respiratory (NON-Expectorated)     Status: None   Collection Time: 08/13/14 11:52 AM  Result Value Ref Range Status   Specimen Description TRACHEAL ASPIRATE  Final   Special Requests NONE  Final   Gram Stain   Final    FEW WBC PRESENT,BOTH PMN AND MONONUCLEAR RARE SQUAMOUS EPITHELIAL CELLS PRESENT FEW GRAM POSITIVE COCCI IN PAIRS IN CLUSTERS RARE GRAM NEGATIVE RODS Performed at [facility]    Culture   Final    ABUNDANT STAPHYLOCOCCUS AUREUS Note: RIFAMPIN AND GENTAMICIN SHOULD NOT BE USED AS SINGLE DRUGS FOR TREATMENT OF STAPH INFECTIONS. Performed at [facility]    Report Status 08/16/2014 FINAL  Final   Organism ID, Bacteria STAPHYLOCOCCUS AUREUS  Final      Susceptibility   Staphylococcus aureus - MIC*    CLINDAMYCIN >=8 RESISTANT Resistant     ERYTHROMYCIN >=8 RESISTANT Resistant     GENTAMICIN <=0.5 SENSITIVE Sensitive     LEVOFLOXACIN 0.25 SENSITIVE Sensitive     OXACILLIN 1 SENSITIVE Sensitive  PENICILLIN >=0.5 RESISTANT Resistant     RIFAMPIN <=0.5 SENSITIVE Sensitive     TRIMETH/SULFA <=10 SENSITIVE Sensitive     VANCOMYCIN 1 SENSITIVE Sensitive     TETRACYCLINE >=16 RESISTANT Resistant     MOXIFLOXACIN <=0.25 SENSITIVE Sensitive     * ABUNDANT STAPHYLOCOCCUS AUREUS  Culture, blood (routine x 2)     Status: None (Preliminary result)   Collection Time: 08/14/14  3:20 PM  Result Value Ref Range Status   Specimen Description BLOOD RIGHT ARM  Final   Special Requests  BOTTLES DRAWN AEROBIC ONLY 6CC  Final   Culture  Setup Time   Final    08/15/2014 00:57 Performed at [facility]    Culture   Final           BLOOD CULTURE RECEIVED NO GROWTH TO DATE CULTURE WILL BE HELD FOR 5 DAYS BEFORE ISSUING A FINAL NEGATIVE REPORT Performed at [facility]    Report Status PENDING  Incomplete  Culture, blood (routine x 2)     Status: None (Preliminary result)   Collection Time: 08/14/14  3:30 PM  Result Value Ref Range Status   Specimen Description BLOOD RIGHT HAND  Final   Special Requests BOTTLES DRAWN AEROBIC ONLY 4CC  Final   Culture  Setup Time   Final    08/15/2014 00:57 Performed at [facility]    Culture   Final           BLOOD CULTURE RECEIVED NO GROWTH TO DATE CULTURE WILL BE HELD FOR 5 DAYS BEFORE ISSUING A FINAL NEGATIVE REPORT Performed at [facility]    Report Status PENDING  Incomplete  Clostridium Difficile by PCR     Status: None   Collection Time: 08/15/14  2:12 PM  Result Value Ref Range Status   C difficile by pcr NEGATIVE NEGATIVE Final  Clostridium Difficile by PCR     Status: None   Collection Time: 08/19/14  1:14 PM  Result Value Ref Range Status   C difficile by pcr NEGATIVE NEGATIVE Final    Studies/Results: Dg Chest 2 View  08/19/2014   CLINICAL DATA:  Fever, weakness  EXAM: CHEST  2 VIEW  COMPARISON:  08/14/2014  FINDINGS: Cardiomediastinal silhouette is stable. No pulmonary edema. There is streaky right middle lobe and left basilar atelectasis or infiltrate. No pulmonary edema. Question small left pleural effusion.  IMPRESSION: Streaky right middle lobe and left basilar atelectasis or infiltrate. Question small left pleural effusion. No pulmonary edema.   Electronically Signed   By: Lahoma Crocker M.D.   On: 08/19/2014 12:58      Assessment/Plan:  Principal Problem:   Staphylococcus aureus bacteremia Active Problems:   Type 1 diabetes, uncontrolled, with neuropathy   Asthma   GERD    Spinal cord infarction (history of)   Acute encephalopathy   Pneumonia   Sepsis   Acute respiratory failure with hypercapnia   UTI (lower urinary tract infection)   Healthcare-associated pneumonia   Renal failure (ARF), acute on chronic   Sacral pressure sore   Severe sepsis   Bacteremia   Osteomyelitis hip   Stage IV decubitus ulcer    Jason [name] is a 28 y.o. male with  Hx of paraplegia spinal cord infarct with MSSA and Enterococcus (latter in only 1/2 blood cultures) without clear source other than open wounds   #1 Staphylococcus Aureus bacteremia       [place] Antimicrobial Management Team Staphylococcus aureus bacteremia  Staphylococcus aureus bacteremia (SAB) is associated with a high rate of complications and mortality.  Specific aspects of clinical management are critical to optimizing the outcome of patients with SAB.  Therefore, the [facility] Antimicrobial Management Team [facility]) has initiated an intervention aimed at improving the management of SAB at [facility].  To do so, Infectious Diseases physicians are providing an evidence-based consult for the management of all patients with SAB.     Yes No Comments  Perform follow-up blood cultures (even if the patient is afebrile) to ensure clearance of bacteremia [x]  []  08/14/14 BLOOD CULTURES COOKING NGTD  Remove vascular catheter and obtain follow-up blood cultures after the removal of the catheter []  []  DO NOT PLACE PICC UNTIL WE ARE 3-4 DAYS OUT FROM NEGATIVE BLOOD CULTURES  Perform echocardiography to evaluate for endocarditis (transthoracic ECHO is 40-50% sensitive, TEE is > 90% sensitive) []  []   TEE NEGATIVE FOR VEGETATIONS  Consult electrophysiologist to evaluate implanted cardiac device (pacemaker, ICD) []  []  NA  Ensure source control []  []  Have all abscesses been drained effectively? Have deep seeded infections (septic joints or osteomyelitis) had appropriate surgical debridement?  MRI RIGHT hip and  pelvis suggest area under his eschar with fluid/necrotic debris next to an area of possible early osteomyelitis  After eschar came off necrotic material was removed per wound care now down to granulation tissue  Investigate for "metastatic" sites of infection []  []  Does the patient have ANY symptom or physical exam finding that would suggest a deeper infection (back or neck pain that may be suggestive of vertebral osteomyelitis or epidural abscess, muscle pain that could be a symptom of pyomyositis)?  Keep in mind that for deep seeded infections MRI imaging with contrast is preferred rather than other often insensitive tests such as plain x-rays, especially early in a patient's presentation. SEE ABOVE DISCUSSION   Change antibiotic therapy to VANCOMYCIN GIVEN HIS PCN ALLEery per Mom he has anaphylaxis []  []  Beta-lactam antibiotics are preferred for MSSA due to higher cure rates.   If on Vancomycin, goal trough should be 15 - 20 mcg/mL  Estimated duration of IV antibiotic therapy:    8 WEEKS WITH CONSIDERATION  TO REPEAT MRI HIP PRIOR TO STOPPING THERAPY  []  []  Consult case management for probably prolonged outpatient IV antibiotic therapy   #2 Enterococcus growing from 1/2 cultures on admission, LIKELY A  contaminant,   #3 Fevers:  CXR unrevealing. He has pyuria but difficult to interpret this , fevers and WBC down trending post removal of eschar  #4 Pyuria: not clear that he has genuine UTI, if he grows organism with colony count and their remains concern for UTI would give him something narrow that only acts at level of the bladder if possible such as fosfomycin if needed  Dr. Linus Salmons is available for questions this weekend  I will set him up for [facility] with [name] in the next 3 weeks      LOS: 8 days   [name] 08/20/2014, 3:15 PM

## 2014-08-20 NOTE — Care Management Note (Addendum)
    Page 1 of 2   08/25/2014     11:04:50 AM CARE MANAGEMENT NOTE 08/25/2014  Patient:  Jason [name], Jason [name]   Account Number:  [id number]  Date Initiated:  08/16/2014  Documentation initiated by:  Elissa Hefty  Subjective/Objective Assessment:   adm w bacteremia     Action/Plan:   lives w fam, pcp dr Remo Lipps fry   Anticipated DC Date:  08/25/2014   Anticipated DC Plan:  [place]  CM consult      [place]   Choice offered to / List presented to:  C-1 Patient        [place] arranged  HH-1 RN  IV Antibiotics      [place]   Status of service:  Completed, signed off Medicare Important Message given?  NO (If response is "NO", the following Medicare IM given date fields will be blank) Date Medicare IM given:   Medicare IM given by:   Date Additional Medicare IM given:   Additional Medicare IM given by:    Discharge Disposition:  [place]  Per UR Regulation:  Reviewed for med. necessity/level of care/duration of stay  If discussed at [place] of Stay Meetings, dates discussed:   08/17/2014    Comments:  08/25/14 [place], BSN [phone] patient is for dc today, Stanton Kidney with [facility] notified and Pam with [facility] notified. There is a referral for HHPT and aide but medicaid does not cover pt or aide for this patient's dx.  Soc will begin 24-48 hrs post dc.  Patient is to go to PCP office for  check up to make sure there is no infection before picc is pulled.  08/24/2014 1711 [name] aware of scheduled dc with HH RN for IV abx and wound care. Jonnie Finner RN CCM Case Mgmt phone [phone]  08/18/2014 1300 High Risk -pt is apart of [place], and assigned to [facility]. If dc home with need HH, [place] RN orders.  Jonnie Finner RN CCM Case Mgmt phone [phone]

## 2014-08-20 NOTE — Progress Notes (Signed)
Patient ID: Jason [name], male   DOB: [birth year], 28 y.o.   MRN: [id number] 3 Days Post-Op  Subjective: Feels ok today.  No complaints  Objective: Vital signs in last 24 hours: Temp:  [99.4 F (37.4 C)-99.6 F (37.6 C)] 99.4 F (37.4 C) (11/13 0510) Pulse Rate:  [85-91] 85 (11/13 0510) Resp:  [18-22] 22 (11/13 0510) BP: (96-133)/(58-93) 133/93 mmHg (11/13 0510) SpO2:  [96 %-100 %] 100 % (11/13 0804) Last BM Date: 08/17/14  Intake/Output from previous day: 11/12 0701 - 11/13 0700 In: 1440 [P.O.:1440] Out: 2552 [Urine:2550; Stool:2] Intake/Output this shift:    PE: Skin: right ischial ulcer is making good progress with hydrotherapy.  There is a small amount of granulation tissue noted around the edges.  Still with fibrin at the base, but debridement is getting good blood return.  No pockets of purulent drainage present.  Suspect findings on his MRI were air and necrotic tissue and no actual purulent drainage  Lab Results:   Recent Labs  08/19/14 0358 08/20/14 0540  WBC 27.6* 21.1*  HGB 7.5* 7.0*  HCT 22.9* 21.3*  PLT 515* 501*   BMET  Recent Labs  08/18/14 0520 08/19/14 0358  NA 136* 135*  K 3.5* 4.2  CL 101 100  CO2 22 22  GLUCOSE 143* 341*  BUN 9 9  CREATININE 0.67 0.67  CALCIUM 8.7 8.5   PT/INR No results for input(s): LABPROT, INR in the last 72 hours. CMP     Component Value Date/Time   NA 135* 08/19/2014 0358   K 4.2 08/19/2014 0358   CL 100 08/19/2014 0358   CO2 22 08/19/2014 0358   GLUCOSE 341* 08/19/2014 0358   BUN 9 08/19/2014 0358   CREATININE 0.67 08/19/2014 0358   CALCIUM 8.5 08/19/2014 0358   PROT 6.9 08/18/2014 0520   ALBUMIN 2.2* 08/18/2014 0520   AST 8 08/18/2014 0520   ALT 6 08/18/2014 0520   ALKPHOS 125* 08/18/2014 0520   BILITOT 0.3 08/18/2014 0520   GFRNONAA >90 08/19/2014 0358   GFRAA >90 08/19/2014 0358   Lipase     Component Value Date/Time   LIPASE 8* 08/12/2014 1840       Studies/Results: Dg Chest 2  View  08/19/2014   CLINICAL DATA:  Fever, weakness  EXAM: CHEST  2 VIEW  COMPARISON:  08/14/2014  FINDINGS: Cardiomediastinal silhouette is stable. No pulmonary edema. There is streaky right middle lobe and left basilar atelectasis or infiltrate. No pulmonary edema. Question small left pleural effusion.  IMPRESSION: Streaky right middle lobe and left basilar atelectasis or infiltrate. Question small left pleural effusion. No pulmonary edema.   Electronically Signed   By: Lahoma Crocker M.D.   On: 08/19/2014 12:58    Anti-infectives: Anti-infectives    Start     Dose/Rate Route Frequency Ordered Stop   08/15/14 1100  vancomycin (VANCOCIN) IVPB 750 mg/150 ml premix     750 mg150 mL/hr over 60 Minutes Intravenous Every 12 hours 08/15/14 1031     08/13/14 2000  vancomycin (VANCOCIN) IVPB 750 mg/150 ml premix  Status:  Discontinued     750 mg150 mL/hr over 60 Minutes Intravenous Every 24 hours 08/13/14 0235 08/13/14 0750   08/13/14 1400  aztreonam (AZACTAM) 1 g in dextrose 5 % 50 mL IVPB  Status:  Discontinued     1 g100 mL/hr over 30 Minutes Intravenous 3 times per day 08/13/14 0646 08/14/14 1254   08/13/14 1000  vancomycin (VANCOCIN) 500 mg in sodium chloride  0.9 % 100 mL IVPB  Status:  Discontinued     500 mg100 mL/hr over 60 Minutes Intravenous Every 12 hours 08/13/14 0750 08/15/14 1031   08/13/14 0800  levofloxacin (LEVAQUIN) IVPB 750 mg  Status:  Discontinued     750 mg100 mL/hr over 90 Minutes Intravenous Every 24 hours 08/13/14 0639 08/13/14 1108   08/13/14 0400  fluconazole (DIFLUCAN) IVPB 200 mg  Status:  Discontinued     200 mg100 mL/hr over 60 Minutes Intravenous Every 24 hours 08/13/14 0148 08/13/14 1108   08/13/14 0245  meropenem (MERREM) 1 g in sodium chloride 0.9 % 100 mL IVPB  Status:  Discontinued     1 g200 mL/hr over 30 Minutes Intravenous Every 12 hours 08/13/14 0234 08/13/14 0639   08/12/14 1945  vancomycin (VANCOCIN) IVPB 1000 mg/200 mL premix     1,000 mg200 mL/hr over 60  Minutes Intravenous  Once 08/12/14 1935 08/12/14 2140   08/12/14 1945  imipenem-cilastatin (PRIMAXIN) 500 mg in sodium chloride 0.9 % 100 mL IVPB     500 mg200 mL/hr over 30 Minutes Intravenous  Once 08/12/14 1937 08/12/14 2036       Assessment/Plan  1. right ischial decubitus ulcer 2. Staph bacteremia  Plan: 1. There is no acute infection noted in this ulcer.  He did have some necrotic tissue that is being debrided, but no actual pocket of purulent drainage is present.  Cont hydrotherapy and bedside debridements to clean this wound up.  He will be prn over the weekend.   LOS: 8 days    Geraline Halberstadt E 08/20/2014, 10:59 AM Pager: [phone]

## 2014-08-20 NOTE — Progress Notes (Signed)
Patient had just finished eating his dinner when RT entered the room for his 2000 treatments and chest vest. RT explained that will wait to do the chest vest on next rounds and patient agreed that would be best. RT advised patient to use the flutter valve  until next treatment .  RT will continue to monitor.

## 2014-08-20 NOTE — Progress Notes (Signed)
Pt status: Went in to give pt noon meds. Pt found to be snoring loudly and hot to touch. After sternal rub and calling pts name several times pt final opened eyes briefly. Vital signs taken and ax temp high, so decided to do rectal which was 101.3. Rectal tube and ABG done without pt wakening. Dr. Carles Collet made aware, and new orders carried out.

## 2014-08-20 NOTE — Plan of Care (Signed)
Problem: Phase II Progression Outcomes Goal: Discharge plan established Outcome: Completed/Met Date Met:  08/20/14

## 2014-08-20 NOTE — Progress Notes (Signed)
PROGRESS NOTE  Jason [name]:[id number] DOB: 1985/12/21 DOA: 08/12/2014 PCP: [name], MD  Assessment/Plan: MSSA bacteremia  - he had a TEE today without vegetations. Continue vancomycin. - Infectious diseases consulted, appreciate input -08/14/2014 surveillance blood cultures negative -suspected source is right hip decubitus ulcer -PICC line when cleared by ID  Right hip decubitus ulcer -MRI right hip reveals complex fluid collection -Consulted general surgery for opinion--appreciate followup -general surgery noted that no actual pocket of purulent drainage is present after bedside debridement. Cont hydrotherapy and bedside debridements to clean this wound up.  Fever -Chest x-ray shows patchy opacities right middle lobe, left base with unchanged small left pleural effusion -08/19/2014--new Foley catheter placed--> Repeat UA and urine culture after new Foley placed -08/15/2014 and 08/19/14--C. Difficile PCR negative -08/20/14--fever 101.51F Acute hypoxic respiratory failure - status post intubation, now on nasal cannula - Continue nebulizers with albuterol and Pulmicort -presently stable on 3 L nasal cannula--flutter valve -08/19/2014 chest x-ray shows right middle lobe and left basilar atelectasis with small left effusion  Acute encephalopathy -Multifactorial including the patient's sepsis/infectious process, opioids, hypoxemia -MR brain--new edema signal in the splenium of corpus callosum--> May be related to the patient's acute respiratory failure -Consulted neurology for opinion--appreciate Dr. [name] may be secondary to his episodes of hypoglycemia and hypoxia and injury related to the same. 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 No pulmonary edema. There is streaky right middle lobe and left basilar atelectasis or infiltrate. No pulmonary edema. Question small left pleural effusion.  IMPRESSION: Streaky right middle lobe and left basilar atelectasis or infiltrate. Question small left pleural effusion. No pulmonary edema.   Electronically Signed   By: Lahoma Crocker M.D.   On: 08/19/2014 12:58   Dg Chest 2 View  07/29/2014   CLINICAL DATA:  Hypotension, history of stroke, left arm weakness  EXAM: CHEST  2 VIEW  COMPARISON:  07/18/2014  FINDINGS: Cardiomediastinal silhouette is stable. No acute infiltrate or pleural effusion. No pulmonary edema. Stable old fracture deformity distal left clavicle.  IMPRESSION: No active cardiopulmonary disease.   Electronically Signed   By: Lahoma Crocker M.D.   On: 07/29/2014 17:54   Ct Head Wo Contrast  07/29/2014   CLINICAL DATA:  Hypertension.  Previous stroke.  EXAM: CT HEAD WITHOUT CONTRAST  TECHNIQUE: Contiguous axial images were  obtained from the base of the skull through the vertex without intravenous contrast.  COMPARISON:  04/27/2014.  Brain MR dated 02/04/2014.  FINDINGS: Previously noted Chiari 1 malformation. Otherwise, normal appearing cerebral hemispheres and posterior fossa structures. Normal size and position of the ventricles. No intracranial hemorrhage, mass lesion or CT evidence of acute infarction. Unremarkable bones and included paranasal sinuses.  IMPRESSION: No acute abnormality.  Previously noted Chiari 1 malformation.   Electronically Signed   By: Enrique Sack M.D.   On: 07/29/2014 20:57   Mr Jeri Cos FV Contrast  08/17/2014   CLINICAL DATA:  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 Confusion with fevers and sepsis. Assess for CNS infection.  EXAM: MRI HEAD WITHOUT AND WITH CONTRAST  TECHNIQUE: Multiplanar, multiecho pulse sequences of the brain and surrounding structures were obtained without and with intravenous contrast.  CONTRAST:  61mL MULTIHANCE GADOBENATE DIMEGLUMINE 529 MG/ML IV SOLN  COMPARISON:  Head CT 07/29/2014.  MRI 01/29/2014.  FINDINGS: The brainstem and cerebellum are normal. The cerebral hemispheres again show a few foci of chronic T2 signal within the white matter, possibly related to old shear injuries. Newly seen is edema in the splenium of the corpus callosum, more on the left than the right. This is associated with low level restricted diffusion. There is no contrast enhancement. No other active pathologic finding. After contrast administration, there is no abnormal enhancement of the brain or leptomeninges, including of the splenium of the corpus callosum  No evidence of mass lesion, hemorrhage, hydrocephalus or extra-axial collection. No pituitary mass. Sinuses are clear.  IMPRESSION: No evidence of intracranial abscess. The patient does have the new finding of edema signal within the splenium of the corpus callosum. The differential diagnosis includes the presenting lesion of multiple  sclerosis, demyelination secondary to vitamin B12 deficiency (Marchiafava-Bignami disease), infarction due to hypoxia, and reversible lesions of the splenium associated with various infectious agents including influenza, rotavirus, E coli, and adenovirus. Other transient lesions can be caused by seizure, posterior reversible encephalopathy and hypoglycemia.   Electronically Signed   By: Nelson Chimes M.D.   On: 08/17/2014 19:26   Mr Hip Right W Wo Contrast  08/17/2014   CLINICAL DATA:  Quadriplegic, status post cord infarct, evaluate for osteomyelitis of the right hip from decubitus ulcer  EXAM: MRI PELVIS WITHOUT AND WITH CONTRAST  TECHNIQUE: Multiplanar multisequence MR imaging of the pelvis was performed both before and after administration of intravenous contrast.  CONTRAST:  43mL MULTIHANCE GADOBENATE DIMEGLUMINE 529 MG/ML IV SOLN  COMPARISON:  None.  FINDINGS: There is no focal marrow signal abnormality of bilateral hips. There is no fracture or dislocation. There is no avascular necrosis. Bilateral sacroiliac joints are normal. There is no joint effusion. There is no gross labral or paralabral abnormality.  There is a decubitus ulcer inferior to the right ischial tuberosity with a small amount air within the ulcer versus packing material. There is a mildly complex fluid collection extending from the decubitus ulcer to the posterior aspect of the right inferior pubic ramus measuring 1.8 x 1.6 x 3.7 cm. There is no definite cortical destruction. There is mild increased T2 signal within the posterior aspect of the right inferior pubic ramus just medial to the ischial tuberosity, which may be reactive versus early osteomyelitis.  There is diffuse mild increased T2 signal throughout the proximal thigh and pelvic musculature which is likely neurogenic.  There is severe bladder wall thickening with a Foley catheter present. The bladder wall thickening is likely secondary to neurogenic bladder.  IMPRESSION: 1.  There is a decubitus ulcer inferior to the right ischial tuberosity  with a small amount air within the ulcer versus packing material. There is a mildly complex fluid collection extending from the decubitus ulcer to the posterior aspect of the right inferior pubic ramus measuring 1.8 x 1.6 x 3.7 cm without definite cortical destruction. There is mild increased T2 signal within the posterior aspect of the right inferior pubic ramus just medial to the ischial tuberosity, which may be reactive versus early osteomyelitis.   Electronically Signed   By: Kathreen Devoid   On: 08/17/2014 20:36   Dg Chest Port 1 View  08/14/2014   CLINICAL DATA:  Respiratory failure.  EXAM: PORTABLE CHEST - 1 VIEW  COMPARISON:  08/13/2014  FINDINGS: Endotracheal tube tip between the clavicular heads and carina. Gastric suction tube reaches the stomach.  Airspace disease in the left lower lobe is again noted, still extensive but less dense. Small left pleural effusion. Medial right basilar and right upper lobe (near the minor fissure) airspace disease again seen. No evidence of cavitation or pneumothorax.  IMPRESSION: 1. Bilateral pneumonia and small left pleural effusion. Aeration in the left lower lobe has improved since yesterday. 2. Endotracheal and orogastric tubes remain in good position.   Electronically Signed   By: Jorje Guild M.D.   On: 08/14/2014 07:47   Dg Chest Port 1 View  08/13/2014   CLINICAL DATA:  Intubation.  EXAM: PORTABLE CHEST - 1 VIEW  COMPARISON:  08/12/2014  FINDINGS: Interval placement of endotracheal tube with tip measuring 4.9 cm above the carinal. Enteric tube was placed. Tip is off the field of view but below the left hemidiaphragm. Heart size and pulmonary vascularity are normal. There is developing small left pleural effusion with consolidation in the left lung base suggesting pneumonia.  IMPRESSION: Appliances appear in satisfactory location. Increasing consolidation and effusion in the left lung base.    Electronically Signed   By: Lucienne Capers M.D.   On: 08/13/2014 06:11   Dg Chest Port 1 View  08/12/2014   CLINICAL DATA:  Fever R50.9 (ICD-10-CM). Altered mental status. Recent dental surgery.  EXAM: PORTABLE CHEST - 1 VIEW  COMPARISON:  07/29/2014  FINDINGS: New subtle densities at the left lung base. Heart size is normal. Trachea is midline. Negative for a pneumothorax.  IMPRESSION: Subtle densities at the left lung base. Findings most likely represent atelectasis.   Electronically Signed   By: Markus Daft M.D.   On: 08/12/2014 19:08         Subjective: Patient developed episode of unresponsiveness for short period of time this afternoon. He woke up spontaneously and was back to his usual baseline mentation. Patient had been previously given Ativan earlier in the morning. Presently, patient denies any fevers, chills, chest pain, shortness breath, abdominal pain. He has been having loose stools  Objective: Filed Vitals:   08/19/14 2313 08/20/14 0113 08/20/14 0510 08/20/14 0804  BP: 131/80  133/93   Pulse: 91  85   Temp: 99.6 F (37.6 C)  99.4 F (37.4 C)   TempSrc: Oral  Oral   Resp: 22  22   Height:      Weight:      SpO2: 97% 96% 100% 100%    Intake/Output Summary (Last 24 hours) at 08/20/14 1253 Last data filed at 08/20/14 1245  Gross per 24 hour  Intake   1080 ml  Output   3552 ml  Net  -2472 ml   Weight change:  Exam:   General:  Pt is alert, follows commands appropriately,  not in acute distress  HEENT: No icterus, No thrush, No neck mass, [place]/AT  Cardiovascular: RRR, S1/S2, no rubs, no gallops  Respiratory: CTA bilaterally, no wheezing, no crackles, no rhonchi  Abdomen: Soft/+BS, non tender, non distended, no guarding  Extremities: No edema, No lymphangitis, No petechiae, No rashes, no synovitis  Data Reviewed: Basic Metabolic Panel:  Recent Labs Lab 08/14/14 0329 08/15/14 0325 08/16/14 0259 08/18/14 0520 08/19/14 0358  NA 139 142 140 136* 135*    K 3.5* 3.4* 4.1 3.5* 4.2  CL 106 110 107 101 100  CO2 17* 20 19 22 22   GLUCOSE 264* 127* 181* 143* 341*  BUN 29* 13 7 9 9   CREATININE 1.22 0.73 0.66 0.67 0.67  CALCIUM 9.5 9.6 9.6 8.7 8.5   Liver Function Tests:  Recent Labs Lab 08/14/14 0329 08/18/14 0520  AST 10 8  ALT 7 6  ALKPHOS 153* 125*  BILITOT 0.2* 0.3  PROT 6.6 6.9  ALBUMIN 2.0* 2.2*   No results for input(s): LIPASE, AMYLASE in the last 168 hours.  Recent Labs Lab 08/18/14 1420  AMMONIA 39   CBC:  Recent Labs Lab 08/14/14 0329 08/15/14 0325 08/18/14 0520 08/19/14 0358 08/20/14 0540  WBC 29.1* 28.6* 24.9* 27.6* 21.1*  NEUTROABS  --   --   --  19.6*  --   HGB 7.4* 7.6* 7.8* 7.5* 7.0*  HCT 23.0* 23.1* 23.7* 22.9* 21.3*  MCV 78.5 76.7* 77.2* 80.1 79.8  PLT 416* 477* 501* 515* 501*   Cardiac Enzymes: No results for input(s): CKTOTAL, CKMB, CKMBINDEX, TROPONINI in the last 168 hours. BNP: Invalid input(s): POCBNP CBG:  Recent Labs Lab 08/19/14 1148 08/19/14 1636 08/19/14 2259 08/20/14 0810 08/20/14 1154  GLUCAP 214* 195* 284* 259* 143*    Recent Results (from the past 240 hour(s))  Blood Culture (routine x 2)     Status: None   Collection Time: 08/12/14  6:40 PM  Result Value Ref Range Status   Specimen Description BLOOD RIGHT [facility]  Final   Special Requests BOTTLES DRAWN AEROBIC AND ANAEROBIC 5CC EACH  Final   Culture  Setup Time   Final    08/13/2014 01:00 Performed at [facility]    Culture   Final    STAPHYLOCOCCUS AUREUS Note: SUSCEPTIBILITIES PERFORMED ON PREVIOUS CULTURE WITHIN THE LAST 5 DAYS. Note: Gram Stain Report Called to,Read Back By and Verified With: [facility] WORK RN [phone] Performed at [facility]    Report Status 08/16/2014 FINAL  Final  Urine culture     Status: None   Collection Time: 08/12/14  6:40 PM  Result Value Ref Range Status   Specimen Description URINE, CATHETERIZED  Final   Special Requests NONE  Final   Culture  Setup Time   Final     08/13/2014 00:43 Performed at [place]   Final    >=100,000 COLONIES/ML Performed at [facility] Performed at [facility]    Report Status 08/16/2014 FINAL  Final   Organism ID, Bacteria KLEBSIELLA PNEUMONIAE  Final      Susceptibility   Klebsiella pneumoniae - MIC*    AMPICILLIN >=32 RESISTANT Resistant     CEFAZOLIN <=4 SENSITIVE Sensitive     CEFTRIAXONE <=1 SENSITIVE Sensitive     CIPROFLOXACIN 0.5 SENSITIVE Sensitive     GENTAMICIN <=1 SENSITIVE Sensitive     LEVOFLOXACIN 2 SENSITIVE Sensitive  NITROFURANTOIN 64 INTERMEDIATE Intermediate     TOBRAMYCIN <=1 SENSITIVE Sensitive     TRIMETH/SULFA <=20 SENSITIVE Sensitive     PIP/TAZO 32 INTERMEDIATE Intermediate     * KLEBSIELLA PNEUMONIAE  Blood Culture (routine x 2)     Status: None   Collection Time: 08/12/14  6:48 PM  Result Value Ref Range Status   Specimen Description BLOOD RIGHT WRIST  Final   Special Requests BOTTLES DRAWN AEROBIC AND ANAEROBIC 10CC EACH  Final   Culture  Setup Time   Final    08/13/2014 00:59 Performed at [facility]    Culture   Final    STAPHYLOCOCCUS AUREUS Note: RIFAMPIN AND GENTAMICIN SHOULD NOT BE USED AS SINGLE DRUGS FOR TREATMENT OF STAPH INFECTIONS. ENTEROCOCCUS SPECIES Note: COMBINATION THERAPY OF HIGH DOSE AMPICILLIN OR VANCOMYCIN, PLUS AN AMINOGLYCOSIDE, IS USUALLY INDICATED FOR SERIOUS ENTEROCOCCAL INFECTIONS. Note: Gram Stain Report Called to,Read Back By and Verified With: Paula Compton RN 700P    Report Status 08/16/2014 FINAL  Final   Organism ID, Bacteria STAPHYLOCOCCUS AUREUS  Final   Organism ID, Bacteria ENTEROCOCCUS SPECIES  Final      Susceptibility   Staphylococcus aureus - MIC*    CLINDAMYCIN >=8 RESISTANT Resistant     ERYTHROMYCIN >=8 RESISTANT Resistant     GENTAMICIN <=0.5 SENSITIVE Sensitive     LEVOFLOXACIN 0.25 SENSITIVE Sensitive     OXACILLIN 1 SENSITIVE  Sensitive     PENICILLIN >=0.5 RESISTANT Resistant     RIFAMPIN <=0.5 SENSITIVE Sensitive     TRIMETH/SULFA 20 SENSITIVE Sensitive     VANCOMYCIN <=0.5 SENSITIVE Sensitive     TETRACYCLINE >=16 RESISTANT Resistant     MOXIFLOXACIN* <=0.25 SENSITIVE Sensitive      * SET UP TIME:  [id number]    * STAPHYLOCOCCUS AUREUS   Enterococcus species - MIC*    AMPICILLIN <=2 SENSITIVE Sensitive     VANCOMYCIN 1 SENSITIVE Sensitive     * ENTEROCOCCUS SPECIES  MRSA PCR Screening     Status: None   Collection Time: 08/13/14 12:43 AM  Result Value Ref Range Status   MRSA by PCR NEGATIVE NEGATIVE Final    Comment:        The GeneXpert MRSA Assay (FDA approved for NASAL specimens only), is one component of a comprehensive MRSA colonization surveillance program. It is not intended to diagnose MRSA infection nor to guide or monitor treatment for MRSA infections.   Culture, respiratory (NON-Expectorated)     Status: None   Collection Time: 08/13/14 11:52 AM  Result Value Ref Range Status   Specimen Description TRACHEAL ASPIRATE  Final   Special Requests NONE  Final   Gram Stain   Final    FEW WBC PRESENT,BOTH PMN AND MONONUCLEAR RARE SQUAMOUS EPITHELIAL CELLS PRESENT FEW GRAM POSITIVE COCCI IN PAIRS IN CLUSTERS RARE GRAM NEGATIVE RODS Performed at [facility]    Culture   Final    ABUNDANT STAPHYLOCOCCUS AUREUS Note: RIFAMPIN AND GENTAMICIN SHOULD NOT BE USED AS SINGLE DRUGS FOR TREATMENT OF STAPH INFECTIONS. Performed at [facility]    Report Status 08/16/2014 FINAL  Final   Organism ID, Bacteria STAPHYLOCOCCUS AUREUS  Final      Susceptibility   Staphylococcus aureus - MIC*    CLINDAMYCIN >=8 RESISTANT Resistant     ERYTHROMYCIN >=8 RESISTANT Resistant     GENTAMICIN <=0.5 SENSITIVE Sensitive     LEVOFLOXACIN 0.25 SENSITIVE Sensitive     OXACILLIN 1 SENSITIVE Sensitive  PENICILLIN >=0.5 RESISTANT Resistant     RIFAMPIN <=0.5 SENSITIVE Sensitive      TRIMETH/SULFA <=10 SENSITIVE Sensitive     VANCOMYCIN 1 SENSITIVE Sensitive     TETRACYCLINE >=16 RESISTANT Resistant     MOXIFLOXACIN <=0.25 SENSITIVE Sensitive     * ABUNDANT STAPHYLOCOCCUS AUREUS  Culture, blood (routine x 2)     Status: None (Preliminary result)   Collection Time: 08/14/14  3:20 PM  Result Value Ref Range Status   Specimen Description BLOOD RIGHT ARM  Final   Special Requests BOTTLES DRAWN AEROBIC ONLY 6CC  Final   Culture  Setup Time   Final    08/15/2014 00:57 Performed at [facility]    Culture   Final           BLOOD CULTURE RECEIVED NO GROWTH TO DATE CULTURE WILL BE HELD FOR 5 DAYS BEFORE ISSUING A FINAL NEGATIVE REPORT Performed at [facility]    Report Status PENDING  Incomplete  Culture, blood (routine x 2)     Status: None (Preliminary result)   Collection Time: 08/14/14  3:30 PM  Result Value Ref Range Status   Specimen Description BLOOD RIGHT HAND  Final   Special Requests BOTTLES DRAWN AEROBIC ONLY 4CC  Final   Culture  Setup Time   Final    08/15/2014 00:57 Performed at [facility]    Culture   Final           BLOOD CULTURE RECEIVED NO GROWTH TO DATE CULTURE WILL BE HELD FOR 5 DAYS BEFORE ISSUING A FINAL NEGATIVE REPORT Performed at [facility]    Report Status PENDING  Incomplete  Clostridium Difficile by PCR     Status: None   Collection Time: 08/15/14  2:12 PM  Result Value Ref Range Status   C difficile by pcr NEGATIVE NEGATIVE Final  Clostridium Difficile by PCR     Status: None   Collection Time: 08/19/14  1:14 PM  Result Value Ref Range Status   C difficile by pcr NEGATIVE NEGATIVE Final     Scheduled Meds: . albuterol  2.5 mg Nebulization TID  . atorvastatin  20 mg Oral Daily  . budesonide  0.25 mg Nebulization Q6H  . collagenase   Topical Daily  . feeding supplement (GLUCERNA SHAKE)  237 mL Oral TID BM  . feeding supplement (PRO-STAT SUGAR FREE 64)  30 mL Oral Daily  . heparin subcutaneous   5,000 Units Subcutaneous 3 times per day  . insulin aspart  0-15 Units Subcutaneous TID WC  . insulin aspart  4 Units Subcutaneous TID WC  . insulin glargine  15 Units Subcutaneous Daily  . morphine  15 mg Oral Q12H  .  morphine injection  1 mg Intravenous Once  . pantoprazole  40 mg Oral Q1200  . pregabalin  100 mg Oral TID  . sodium chloride  3 mL Intravenous Q12H  . vancomycin  750 mg Intravenous Q12H   Continuous Infusions:    Godwin Tedesco, DO  [facility] Pager [phone]  If 7PM-7AM, please contact night-coverage [email or web address] Password [email or web address] 08/20/2014, 12:53 PM   LOS: 8 days

## 2014-08-20 NOTE — Progress Notes (Signed)
Physical Therapy Wound Treatment Patient Details  Name: CADEL STAIRS MRN: [id number] Date of Birth: June 10, 1986  Today's Date: 08/20/2014 Time: 9024-0973 Time Calculation (min): 41 min  Subjective     Pain Score:  pt reports pain down his spine--Asking for meds--not rated  Wound Assessment  Pressure Ulcer 08/13/14 Unstageable - Full thickness tissue loss in which the base of the ulcer is covered by slough (yellow, tan, gray, green or brown) and/or eschar (tan, brown or black) in the wound bed. 4cm x 3 cm  necrotic (Active)  Dressing Type Gauze (Comment);Moist to dry;ABD 08/20/2014 10:47 AM  Dressing Clean;Dry;Intact 08/20/2014 10:47 AM  Dressing Change Frequency Daily 08/20/2014 10:47 AM  State of Healing Eschar 08/20/2014 10:47 AM  Site / Wound Assessment Clean;Dry 08/20/2014 10:47 AM  % Wound base Red or Granulating 70% 08/20/2014 10:47 AM  % Wound base Yellow 30% 08/20/2014 10:47 AM  % Wound base Black 0% 08/20/2014 10:47 AM  % Wound base Other (Comment) 0% 08/20/2014 10:47 AM  Peri-wound Assessment Intact;Pink 08/20/2014 10:47 AM  Wound Length (cm) 4 cm 08/19/2014 11:00 AM  Wound Width (cm) 3.5 cm 08/19/2014 11:00 AM  Wound Depth (cm) 3 cm 08/19/2014 11:00 AM  Drainage Amount Moderate 08/20/2014 10:47 AM  Drainage Description Serosanguineous 08/20/2014 10:47 AM  Treatment Cleansed;Debridement (Selective);Hydrotherapy (Pulse lavage);Packing (Saline gauze) 08/19/2014 11:00 AM     Pressure Ulcer 08/13/14 Stage II -  Partial thickness loss of dermis presenting as a shallow open ulcer with a red, pink wound bed without slough. 2cm x 3 cm necrotic (Active)  Dressing Type Foam 08/19/2014  7:20 PM  Dressing Clean;Dry;Intact 08/19/2014  7:20 PM  Dressing Change Frequency PRN 08/19/2014  7:20 PM  State of Healing Non-healing 08/17/2014  7:50 AM  Site / Wound Assessment Clean;Dry;Pink 08/17/2014  7:50 AM  % Wound base Black 20% 08/13/2014  8:00 AM  Peri-wound Assessment Intact;Pink  08/16/2014  3:27 PM  Drainage Amount None 08/19/2014  7:20 PM  Drainage Description Serous 08/19/2014  7:20 PM  Treatment Other (Comment) 08/15/2014 11:00 AM   Hydrotherapy Pulsed lavage therapy - wound location: R ischium Pulsed Lavage with Suction (psi): 8 psi ( to 4) Pulsed Lavage with Suction - Normal Saline Used: 1000 mL Pulsed Lavage Tip: Tip with splash shield Selective Debridement Selective Debridement - Location: R ischium Selective Debridement - Tools Used: Forceps;Scalpel Selective Debridement - Tissue Removed: necrotic tissues/ eschar   Wound Assessment and Plan  Wound Therapy - Assess/Plan/Recommendations Wound Therapy - Clinical Statement: pt's wound will benefit from PLS to help decrease bacterial load soften tissues in prep for selective debridement to get down to healthy tissues Wound Therapy - Functional Problem List: quad,  Factors Delaying/Impairing Wound Healing: Diabetes Mellitus;Immobility;Tobacco use Hydrotherapy Plan: Debridement;Dressing change;Patient/family education;Pulsatile lavage with suction Wound Therapy - Frequency: 6X / week Wound Therapy - Follow Up Recommendations: Wound Care Center;Home health RN Wound Plan: see above  Wound Therapy Goals- Improve the function of patient's integumentary system by progressing the wound(s) through the phases of wound healing (inflammation - proliferation - remodeling) by: Decrease Necrotic Tissue to: 10% Decrease Necrotic Tissue - Progress: Progressing toward goal Increase Granulation Tissue to: 90% Increase Granulation Tissue - Progress: Progressing toward goal Goals/treatment plan/discharge plan were made with and agreed upon by patient/family: Yes Time For Goal Achievement: 7 days Wound Therapy - Potential for Goals: Good  Goals will be updated until maximal potential achieved or discharge criteria met.  Discharge criteria: when goals achieved, discharge from hospital, MD decision/surgical  intervention, no  progress towards goals, refusal/missing three consecutive treatments without notification or medical reason.  GP     Debany Vantol, [name] 08/20/2014, 10:49 AM 08/20/2014  Donnella Sham, [place] [phone]  (pager)

## 2014-08-21 LAB — COMPREHENSIVE METABOLIC PANEL
ALBUMIN: 2.3 g/dL — AB (ref 3.5–5.2)
ALK PHOS: 114 U/L (ref 39–117)
ALT: 6 U/L (ref 0–53)
AST: 8 U/L (ref 0–37)
Anion gap: 11 (ref 5–15)
BUN: 8 mg/dL (ref 6–23)
CHLORIDE: 98 meq/L (ref 96–112)
CO2: 26 meq/L (ref 19–32)
CREATININE: 0.67 mg/dL (ref 0.50–1.35)
Calcium: 9.3 mg/dL (ref 8.4–10.5)
GFR calc Af Amer: >90 mL/min (ref >90)
Glucose, Bld: 335 mg/dL — ABNORMAL HIGH (ref 70–99)
POTASSIUM: 4.8 meq/L (ref 3.7–5.3)
SODIUM: 135 meq/L — AB (ref 137–147)
Total Bilirubin: 0.2 mg/dL — ABNORMAL LOW (ref 0.3–1.2)
Total Protein: 7.8 g/dL (ref 6.0–8.3)

## 2014-08-21 LAB — GLUCOSE, CAPILLARY
Glucose-Capillary: 340 mg/dL — ABNORMAL HIGH (ref 70–99)
Glucose-Capillary: 265 mg/dL — ABNORMAL HIGH (ref 70–99)
Glucose-Capillary: 157 mg/dL — ABNORMAL HIGH (ref 70–99)
Glucose-Capillary: 241 mg/dL — ABNORMAL HIGH (ref 70–99)

## 2014-08-21 LAB — CULTURE, BLOOD (ROUTINE X 2)
CULTURE: NO GROWTH
Culture: NO GROWTH

## 2014-08-21 LAB — CBC
HCT: 25 % — ABNORMAL LOW (ref 39.0–52.0)
Hemoglobin: 8 g/dL — ABNORMAL LOW (ref 13.0–17.0)
MCH: 26.2 pg (ref 26.0–34.0)
MCHC: 32 g/dL (ref 30.0–36.0)
MCV: 82 fL (ref 78.0–100.0)
PLATELETS: 645 10*3/uL — AB (ref 150–400)
RBC: 3.05 MIL/uL — AB (ref 4.22–5.81)
RDW: 16.6 % — AB (ref 11.5–15.5)
WBC: 21.2 10*3/uL — AB (ref 4.0–10.5)

## 2014-08-21 MED ORDER — IBUPROFEN 400 MG PO TABS
400.0000 mg | ORAL_TABLET | Freq: Once | ORAL | Status: AC
  Administered 2014-08-21: 400 mg via ORAL
  Filled 2014-08-21: qty 1

## 2014-08-21 MED ORDER — INSULIN ASPART 100 UNIT/ML ~~LOC~~ SOLN
5.0000 [IU] | Freq: Three times a day (TID) | SUBCUTANEOUS | Status: DC
Start: 2014-08-21 — End: 2014-08-22
  Administered 2014-08-21: 5 [IU] via SUBCUTANEOUS

## 2014-08-21 MED ORDER — SODIUM CHLORIDE 0.9 % IV SOLN
INTRAVENOUS | Status: DC
  Administered 2014-08-21 – 2014-08-24 (×2): via INTRAVENOUS

## 2014-08-21 MED ORDER — METHOCARBAMOL 500 MG PO TABS
500.0000 mg | ORAL_TABLET | Freq: Once | ORAL | Status: AC
  Administered 2014-08-21: 500 mg via ORAL
  Filled 2014-08-21 (×2): qty 1

## 2014-08-21 MED ORDER — INSULIN GLARGINE 100 UNIT/ML ~~LOC~~ SOLN
20.0000 [IU] | Freq: Every day | SUBCUTANEOUS | Status: DC
Start: 2014-08-22 — End: 2014-08-22
  Filled 2014-08-21: qty 0.2

## 2014-08-21 NOTE — Progress Notes (Signed)
Patient asked for something for pain- NP Rogue Bussing notified. Awaiting new orders

## 2014-08-21 NOTE — Progress Notes (Signed)
PROGRESS NOTE  Jason [name] DOB: April 19, 1986 DOA: 08/12/2014 PCP: [name], MD  Assessment/Plan: MSSA bacteremia  - he had a TEE today without vegetations. Continue vancomycin. - Infectious diseases consulted, appreciate input -08/14/2014 surveillance blood cultures negative -suspected source is right hip decubitus ulcer -PICC line--as the patient's surveillance blood cultures have been negative greater than 5 days  Right hip decubitus ulcer -MRI right hip reveals complex fluid collection -Consulted general surgery for opinion--appreciate followup -general surgery noted that no actual pocket of purulent drainage is present after bedside debridement. Cont hydrotherapy and bedside debridements to clean this wound up.  Fever -Chest x-ray shows patchy opacities right middle lobe, left base with unchanged small left pleural effusion -08/19/2014--new Foley catheter placed--> Repeat UA and urine culture after new Foley placed -08/15/2014 and 08/19/14--C. Difficile PCR negative -08/20/14--fever 101.31F -08/21/2014--temperature improving after necrotic hip Wound now debrided with hydrotherapy -although patient has pyuria on the most recent UA (08/18/2014), urine culture is negative--will not start additional antibiotics Acute hypoxic respiratory failure - status post intubation, now on nasal cannula - Continue nebulizers with albuterol and Pulmicort -presently stable on 2 L nasal cannula--flutter valve -08/19/2014 chest x-ray shows right middle lobe and left basilar atelectasis with small left effusion  Acute encephalopathy -Multifactorial including the patient's sepsis/infectious process, opioids, hypoxemia -MR brain--new edema signal in the splenium of corpus callosum--> May be related to the patient's acute respiratory failure -Consulted neurology for opinion--appreciate Dr. [name] may be secondary to his episodes of hypoglycemia and hypoxia  and injury related to the same. Doubt infection -Although less likely, we'll check serum B12, ammonia, TSH, HIV--all unremarkable -08/20/2014--patient developed an episode of decreased responsiveness--> CBG 136, ABG showed 7.45/37/90/26 on 2 L,--suspect due to opioids and benzodiazepines--patient subsequently became more alert back to his baseline spontaneously -Discontinue opioids and Ativan -08/21/2014--mentation is back to baseline according to the patient's fiance at the bedside Hypertension  -discontinue antihypertensive medications as the patient's blood pressure is soft -blood pressure remains labile, but stable off of antihypertensive medication  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 - Stable, continue PT  Enterococcal bacteremia - suspect Contaminant  Diabetes mellitus - continue Lantus  daily and sliding scale insulin - Continue Lyrica for neuropathy -CBGs increasing -start NovoLog 4 units with meals -increase lantus to 20 units daily HLD - continue Lipitor  Chronic pain - d/c narcotics due to encephalopathy   Family Communication: fiance updated at the bedside 08/21/2014 Disposition Plan: Home when medically stable         Procedures/Studies: Dg Chest 2 View  08/19/2014   CLINICAL DATA:  Fever, weakness  EXAM: CHEST  2 VIEW  COMPARISON:  08/14/2014  FINDINGS: Cardiomediastinal silhouette is stable. No pulmonary edema. There is streaky right middle lobe and left basilar atelectasis or infiltrate. No pulmonary edema. Question small left pleural effusion.  IMPRESSION: Streaky right middle lobe and left basilar atelectasis or infiltrate. Question small left pleural effusion. No pulmonary edema.   Electronically Signed   By: Lahoma Crocker M.D.   On: 08/19/2014 12:58   Dg Chest 2 View  07/29/2014   CLINICAL DATA:  Hypotension, history of stroke, left arm weakness  EXAM: CHEST  2 VIEW  COMPARISON:  07/18/2014  FINDINGS:  Cardiomediastinal silhouette is stable. No acute infiltrate or pleural effusion. No pulmonary edema. Stable old fracture deformity distal left clavicle.  IMPRESSION: No active cardiopulmonary disease.   Electronically Signed  By: Lahoma Crocker M.D.   On: 07/29/2014 17:54   Ct Head Wo Contrast  07/29/2014   CLINICAL DATA:  Hypertension.  Previous stroke.  EXAM: CT HEAD WITHOUT CONTRAST  TECHNIQUE: Contiguous axial images were obtained from the base of the skull through the vertex without intravenous contrast.  COMPARISON:  04/27/2014.  Brain MR dated 02/04/2014.  FINDINGS: Previously noted Chiari 1 malformation. Otherwise, normal appearing cerebral hemispheres and posterior fossa structures. Normal size and position of the ventricles. No intracranial hemorrhage, mass lesion or CT evidence of acute infarction. Unremarkable bones and included paranasal sinuses.  IMPRESSION: No acute abnormality.  Previously noted Chiari 1 malformation.   Electronically Signed   By: Enrique Sack M.D.   On: 07/29/2014 20:57   Mr Jeri Cos MW Contrast  08/17/2014   CLINICAL DATA:  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 Confusion with fevers and sepsis. Assess for CNS infection.  EXAM: MRI HEAD WITHOUT AND WITH CONTRAST  TECHNIQUE: Multiplanar, multiecho pulse sequences of the brain and surrounding structures were obtained without and with intravenous contrast.  CONTRAST:  8mL MULTIHANCE GADOBENATE DIMEGLUMINE 529 MG/ML IV SOLN  COMPARISON:  Head CT 07/29/2014.  MRI 01/29/2014.  FINDINGS: The brainstem and cerebellum are normal. The cerebral hemispheres again show a few foci of chronic T2 signal within the white matter, possibly related to old shear injuries. Newly seen is edema in the splenium of the corpus callosum, more on the left than the right. This is associated with low level restricted diffusion. There is no contrast enhancement. No other active pathologic finding. After contrast administration, there is no abnormal  enhancement of the brain or leptomeninges, including of the splenium of the corpus callosum  No evidence of mass lesion, hemorrhage, hydrocephalus or extra-axial collection. No pituitary mass. Sinuses are clear.  IMPRESSION: No evidence of intracranial abscess. The patient does have the new finding of edema signal within the splenium of the corpus callosum. The differential diagnosis includes the presenting lesion of multiple sclerosis, demyelination secondary to vitamin B12 deficiency (Marchiafava-Bignami disease), infarction due to hypoxia, and reversible lesions of the splenium associated with various infectious agents including influenza, rotavirus, E coli, and adenovirus. Other transient lesions can be caused by seizure, posterior reversible encephalopathy and hypoglycemia.   Electronically Signed   By: Nelson Chimes M.D.   On: 08/17/2014 19:26   Mr Hip Right W Wo Contrast  08/17/2014   CLINICAL DATA:  Quadriplegic, status post cord infarct, evaluate for osteomyelitis of the right hip from decubitus ulcer  EXAM: MRI PELVIS WITHOUT AND WITH CONTRAST  TECHNIQUE: Multiplanar multisequence MR imaging of the pelvis was performed both before and after administration of intravenous contrast.  CONTRAST:  29mL MULTIHANCE GADOBENATE DIMEGLUMINE 529 MG/ML IV SOLN  COMPARISON:  None.  FINDINGS: There is no focal marrow signal abnormality of bilateral hips. There is no fracture or dislocation. There is no avascular necrosis. Bilateral sacroiliac joints are normal. There is no joint effusion. There is no gross labral or paralabral abnormality.  There is a decubitus ulcer inferior to the right ischial tuberosity with a small amount air within the ulcer versus packing material. There is a mildly complex fluid collection extending from the decubitus ulcer to the posterior aspect of the right inferior pubic ramus measuring 1.8 x 1.6 x 3.7 cm. There is no definite cortical destruction. There is mild increased T2 signal within  the posterior aspect of the right inferior pubic ramus just medial to the ischial tuberosity, which may be reactive versus  early osteomyelitis.  There is diffuse mild increased T2 signal throughout the proximal thigh and pelvic musculature which is likely neurogenic.  There is severe bladder wall thickening with a Foley catheter present. The bladder wall thickening is likely secondary to neurogenic bladder.  IMPRESSION: 1. There is a decubitus ulcer inferior to the right ischial tuberosity with a small amount air within the ulcer versus packing material. There is a mildly complex fluid collection extending from the decubitus ulcer to the posterior aspect of the right inferior pubic ramus measuring 1.8 x 1.6 x 3.7 cm without definite cortical destruction. There is mild increased T2 signal within the posterior aspect of the right inferior pubic ramus just medial to the ischial tuberosity, which may be reactive versus early osteomyelitis.   Electronically Signed   By: Kathreen Devoid   On: 08/17/2014 20:36   Dg Chest Port 1 View  08/14/2014   CLINICAL DATA:  Respiratory failure.  EXAM: PORTABLE CHEST - 1 VIEW  COMPARISON:  08/13/2014  FINDINGS: Endotracheal tube tip between the clavicular heads and carina. Gastric suction tube reaches the stomach.  Airspace disease in the left lower lobe is again noted, still extensive but less dense. Small left pleural effusion. Medial right basilar and right upper lobe (near the minor fissure) airspace disease again seen. No evidence of cavitation or pneumothorax.  IMPRESSION: 1. Bilateral pneumonia and small left pleural effusion. Aeration in the left lower lobe has improved since yesterday. 2. Endotracheal and orogastric tubes remain in good position.   Electronically Signed   By: Jorje Guild M.D.   On: 08/14/2014 07:47   Dg Chest Port 1 View  08/13/2014   CLINICAL DATA:  Intubation.  EXAM: PORTABLE CHEST - 1 VIEW  COMPARISON:  08/12/2014  FINDINGS: Interval placement of  endotracheal tube with tip measuring 4.9 cm above the carinal. Enteric tube was placed. Tip is off the field of view but below the left hemidiaphragm. Heart size and pulmonary vascularity are normal. There is developing small left pleural effusion with consolidation in the left lung base suggesting pneumonia.  IMPRESSION: Appliances appear in satisfactory location. Increasing consolidation and effusion in the left lung base.   Electronically Signed   By: Lucienne Capers M.D.   On: 08/13/2014 06:11   Dg Chest Port 1 View  08/12/2014   CLINICAL DATA:  Fever R50.9 (ICD-10-CM). Altered mental status. Recent dental surgery.  EXAM: PORTABLE CHEST - 1 VIEW  COMPARISON:  07/29/2014  FINDINGS: New subtle densities at the left lung base. Heart size is normal. Trachea is midline. Negative for a pneumothorax.  IMPRESSION: Subtle densities at the left lung base. Findings most likely represent atelectasis.   Electronically Signed   By: Markus Daft M.D.   On: 08/12/2014 19:08         Subjective: Patient denies any fevers, chills, chest pain, shortness breath, nausea, vomiting, diarrhea, abdominal pain. He continues to complain of right hip pain.  Objective: Filed Vitals:   08/21/14 0249 08/21/14 0636 08/21/14 0717 08/21/14 1406  BP:  131/81  102/70  Pulse:  88    Temp:    98.4 F (36.9 C)  TempSrc:    Oral  Resp:  18  16  Height:      Weight:      SpO2: 99% 100% 99% 100%    Intake/Output Summary (Last 24 hours) at 08/21/14 1429 Last data filed at 08/21/14 1301  Gross per 24 hour  Intake    700 ml  Output  3103 ml  Net  -2403 ml   Weight change:  Exam:   General:  Pt is alert, follows commands appropriately, not in acute distress  HEENT: No icterus, No thrush, No meningismus,[place]/AT  Cardiovascular: RRR, S1/S2, no rubs, no gallops  Respiratory: bibasilar rales without any wheezing. Good air movement.  Abdomen: Soft/+BS, non tender, non distended, no guarding  Extremities: No edema, No  lymphangitis, No petechiae, No rashes, no synovitis  Data Reviewed: Basic Metabolic Panel:  Recent Labs Lab 08/15/14 0325 08/16/14 0259 08/18/14 0520 08/19/14 0358 08/21/14 0614  NA 142 140 136* 135* 135*  K 3.4* 4.1 3.5* 4.2 4.8  CL 110 107 101 100 98  CO2 20 19 22 22 26   GLUCOSE 127* 181* 143* 341* 335*  BUN 13 7 9 9 8   CREATININE 0.73 0.66 0.67 0.67 0.67  CALCIUM 9.6 9.6 8.7 8.5 9.3   Liver Function Tests:  Recent Labs Lab 08/18/14 0520 08/21/14 0614  AST 8 8  ALT 6 6  ALKPHOS 125* 114  BILITOT 0.3 0.2*  PROT 6.9 7.8  ALBUMIN 2.2* 2.3*   No results for input(s): LIPASE, AMYLASE in the last 168 hours.  Recent Labs Lab 08/18/14 1420  AMMONIA 39   CBC:  Recent Labs Lab 08/15/14 0325 08/18/14 0520 08/19/14 0358 08/20/14 0540 08/21/14 0614  WBC 28.6* 24.9* 27.6* 21.1* 21.2*  NEUTROABS  --   --  19.6*  --   --   HGB 7.6* 7.8* 7.5* 7.0* 8.0*  HCT 23.1* 23.7* 22.9* 21.3* 25.0*  MCV 76.7* 77.2* 80.1 79.8 82.0  PLT 477* 501* 515* 501* 645*   Cardiac Enzymes: No results for input(s): CKTOTAL, CKMB, CKMBINDEX, TROPONINI in the last 168 hours. BNP: Invalid input(s): POCBNP CBG:  Recent Labs Lab 08/20/14 1738 08/20/14 1756 08/20/14 2227 08/21/14 0750 08/21/14 1159  GLUCAP 61* 76 260* 340* 265*    Recent Results (from the past 240 hour(s))  Blood Culture (routine x 2)     Status: None   Collection Time: 08/12/14  6:40 PM  Result Value Ref Range Status   Specimen Description BLOOD RIGHT [facility]  Final   Special Requests BOTTLES DRAWN AEROBIC AND ANAEROBIC 5CC EACH  Final   Culture  Setup Time   Final    08/13/2014 01:00 Performed at [facility]    Culture   Final    STAPHYLOCOCCUS AUREUS Note: SUSCEPTIBILITIES PERFORMED ON PREVIOUS CULTURE WITHIN THE LAST 5 DAYS. Note: Gram Stain Report Called to,Read Back By and Verified With: [facility] WORK RN [phone] Performed at [facility]    Report Status 08/16/2014 FINAL  Final  Urine  culture     Status: None   Collection Time: 08/12/14  6:40 PM  Result Value Ref Range Status   Specimen Description URINE, CATHETERIZED  Final   Special Requests NONE  Final   Culture  Setup Time   Final    08/13/2014 00:43 Performed at [place]   Final    >=100,000 COLONIES/ML Performed at [place] Performed at [facility]    Report Status 08/16/2014 FINAL  Final   Organism ID, Bacteria KLEBSIELLA PNEUMONIAE  Final      Susceptibility   Klebsiella pneumoniae - MIC*    AMPICILLIN >=32 RESISTANT Resistant     CEFAZOLIN <=4 SENSITIVE Sensitive     CEFTRIAXONE <=1 SENSITIVE Sensitive     CIPROFLOXACIN  0.5 SENSITIVE Sensitive     GENTAMICIN <=1 SENSITIVE Sensitive     LEVOFLOXACIN 2 SENSITIVE Sensitive     NITROFURANTOIN 64 INTERMEDIATE Intermediate     TOBRAMYCIN <=1 SENSITIVE Sensitive     TRIMETH/SULFA <=20 SENSITIVE Sensitive     PIP/TAZO 32 INTERMEDIATE Intermediate     * KLEBSIELLA PNEUMONIAE  Blood Culture (routine x 2)     Status: None   Collection Time: 08/12/14  6:48 PM  Result Value Ref Range Status   Specimen Description BLOOD RIGHT WRIST  Final   Special Requests BOTTLES DRAWN AEROBIC AND ANAEROBIC 10CC EACH  Final   Culture  Setup Time   Final    08/13/2014 00:59 Performed at [facility]    Culture   Final    STAPHYLOCOCCUS AUREUS Note: RIFAMPIN AND GENTAMICIN SHOULD NOT BE USED AS SINGLE DRUGS FOR TREATMENT OF STAPH INFECTIONS. ENTEROCOCCUS SPECIES Note: COMBINATION THERAPY OF HIGH DOSE AMPICILLIN OR VANCOMYCIN, PLUS AN AMINOGLYCOSIDE, IS USUALLY INDICATED FOR SERIOUS ENTEROCOCCAL INFECTIONS. Note: Gram Stain Report Called to,Read Back By and Verified With: Paula Compton RN 700P    Report Status 08/16/2014 FINAL  Final   Organism ID, Bacteria STAPHYLOCOCCUS AUREUS  Final   Organism ID, Bacteria ENTEROCOCCUS SPECIES  Final      Susceptibility    Staphylococcus aureus - MIC*    CLINDAMYCIN >=8 RESISTANT Resistant     ERYTHROMYCIN >=8 RESISTANT Resistant     GENTAMICIN <=0.5 SENSITIVE Sensitive     LEVOFLOXACIN 0.25 SENSITIVE Sensitive     OXACILLIN 1 SENSITIVE Sensitive     PENICILLIN >=0.5 RESISTANT Resistant     RIFAMPIN <=0.5 SENSITIVE Sensitive     TRIMETH/SULFA 20 SENSITIVE Sensitive     VANCOMYCIN <=0.5 SENSITIVE Sensitive     TETRACYCLINE >=16 RESISTANT Resistant     MOXIFLOXACIN* <=0.25 SENSITIVE Sensitive      * SET UP TIME:  [id number]    * STAPHYLOCOCCUS AUREUS   Enterococcus species - MIC*    AMPICILLIN <=2 SENSITIVE Sensitive     VANCOMYCIN 1 SENSITIVE Sensitive     * ENTEROCOCCUS SPECIES  MRSA PCR Screening     Status: None   Collection Time: 08/13/14 12:43 AM  Result Value Ref Range Status   MRSA by PCR NEGATIVE NEGATIVE Final    Comment:        The GeneXpert MRSA Assay (FDA approved for NASAL specimens only), is one component of a comprehensive MRSA colonization surveillance program. It is not intended to diagnose MRSA infection nor to guide or monitor treatment for MRSA infections.   Culture, respiratory (NON-Expectorated)     Status: None   Collection Time: 08/13/14 11:52 AM  Result Value Ref Range Status   Specimen Description TRACHEAL ASPIRATE  Final   Special Requests NONE  Final   Gram Stain   Final    FEW WBC PRESENT,BOTH PMN AND MONONUCLEAR RARE SQUAMOUS EPITHELIAL CELLS PRESENT FEW GRAM POSITIVE COCCI IN PAIRS IN CLUSTERS RARE GRAM NEGATIVE RODS Performed at [facility]    Culture   Final    ABUNDANT STAPHYLOCOCCUS AUREUS Note: RIFAMPIN AND GENTAMICIN SHOULD NOT BE USED AS SINGLE DRUGS FOR TREATMENT OF STAPH INFECTIONS. Performed at [facility]    Report Status 08/16/2014 FINAL  Final   Organism ID, Bacteria STAPHYLOCOCCUS AUREUS  Final      Susceptibility   Staphylococcus aureus - MIC*    CLINDAMYCIN >=8 RESISTANT Resistant     ERYTHROMYCIN >=8 RESISTANT  Resistant  GENTAMICIN <=0.5 SENSITIVE Sensitive     LEVOFLOXACIN 0.25 SENSITIVE Sensitive     OXACILLIN 1 SENSITIVE Sensitive     PENICILLIN >=0.5 RESISTANT Resistant     RIFAMPIN <=0.5 SENSITIVE Sensitive     TRIMETH/SULFA <=10 SENSITIVE Sensitive     VANCOMYCIN 1 SENSITIVE Sensitive     TETRACYCLINE >=16 RESISTANT Resistant     MOXIFLOXACIN <=0.25 SENSITIVE Sensitive     * ABUNDANT STAPHYLOCOCCUS AUREUS  Culture, blood (routine x 2)     Status: None   Collection Time: 08/14/14  3:20 PM  Result Value Ref Range Status   Specimen Description BLOOD RIGHT ARM  Final   Special Requests BOTTLES DRAWN AEROBIC ONLY 6CC  Final   Culture  Setup Time   Final    08/15/2014 00:57 Performed at [place]   Final    NO GROWTH 5 DAYS Performed at [facility]    Report Status 08/21/2014 FINAL  Final  Culture, blood (routine x 2)     Status: None   Collection Time: 08/14/14  3:30 PM  Result Value Ref Range Status   Specimen Description BLOOD RIGHT HAND  Final   Special Requests BOTTLES DRAWN AEROBIC ONLY 4CC  Final   Culture  Setup Time   Final    08/15/2014 00:57 Performed at [place]   Final    NO GROWTH 5 DAYS Performed at [facility]    Report Status 08/21/2014 FINAL  Final  Clostridium Difficile by PCR     Status: None   Collection Time: 08/15/14  2:12 PM  Result Value Ref Range Status   C difficile by pcr NEGATIVE NEGATIVE Final  Clostridium Difficile by PCR     Status: None   Collection Time: 08/19/14  1:14 PM  Result Value Ref Range Status   C difficile by pcr NEGATIVE NEGATIVE Final  Urine culture     Status: None   Collection Time: 08/19/14  4:46 PM  Result Value Ref Range Status   Specimen Description URINE, CATHETERIZED  Final   Special Requests NONE  Final   Culture  Setup Time   Final    08/19/2014 22:41 Performed at [place] Performed at [facility]    Final   Culture NO GROWTH Performed at [facility]   Final   Report Status 08/20/2014 FINAL  Final     Scheduled Meds: . albuterol  2.5 mg Nebulization TID  . atorvastatin  20 mg Oral Daily  . budesonide  0.25 mg Nebulization Q6H  . collagenase   Topical Daily  . feeding supplement (GLUCERNA SHAKE)  237 mL Oral TID BM  . feeding supplement (PRO-STAT SUGAR FREE 64)  30 mL Oral Daily  . heparin subcutaneous  5,000 Units Subcutaneous 3 times per day  . insulin aspart  0-15 Units Subcutaneous TID WC  . insulin aspart  4 Units Subcutaneous TID WC  . insulin glargine  15 Units Subcutaneous Daily  . pantoprazole  40 mg Oral Q1200  . pregabalin  100 mg Oral TID  . sodium chloride  3 mL Intravenous Q12H  . vancomycin  750 mg Intravenous Q12H   Continuous Infusions:    Amaru Burroughs, DO  [facility] Pager [phone]  If 7PM-7AM, please contact night-coverage [email or web address] Password [email or web address] 08/21/2014, 2:29 PM   LOS: 9 days

## 2014-08-21 NOTE — Progress Notes (Signed)
Physical Therapy Wound Treatment Patient Details  Name: TEAGAN HEIDRICK MRN: [id number] Date of Birth: 24-Mar-1986  Today's Date: 08/21/2014 Time: 9242-6834 Time Calculation (min): 25 min  Subjective  Subjective: Minimal verbalization  Pain Score:  No report of pain  Wound Assessment  Pressure Ulcer 08/13/14 Unstageable - Full thickness tissue loss in which the base of the ulcer is covered by slough (yellow, tan, gray, green or brown) and/or eschar (tan, brown or black) in the wound bed. 4cm x 3 cm  necrotic (Active)  Dressing Type Gauze (Comment);Moist to dry;ABD;Tape dressing 08/21/2014  1:06 PM  Dressing Changed;Clean;Dry;Intact 08/21/2014  1:06 PM  Dressing Change Frequency Daily 08/21/2014  1:06 PM  State of Healing Eschar 08/21/2014  1:06 PM  Site / Wound Assessment Pink;Yellow 08/21/2014  1:06 PM  % Wound base Red or Granulating 30% 08/21/2014  1:06 PM  % Wound base Yellow 70% 08/21/2014  1:06 PM  % Wound base Black 0% 08/21/2014  1:06 PM  % Wound base Other (Comment) 0% 08/21/2014  1:06 PM  Peri-wound Assessment Intact;Pink 08/21/2014  1:06 PM  Margins Unattached edges (unapproximated) 08/21/2014  1:06 PM  Drainage Amount Moderate 08/21/2014  1:06 PM  Drainage Description Serosanguineous 08/21/2014  1:06 PM  Treatment Debridement (Selective);Hydrotherapy (Pulse lavage);Packing (Saline gauze) 08/21/2014  1:06 PM      Hydrotherapy Pulsed lavage therapy - wound location: R ischium Pulsed Lavage with Suction (psi): 8 psi ( to 4) Pulsed Lavage with Suction - Normal Saline Used: 1000 mL Pulsed Lavage Tip: Tip with splash shield Selective Debridement Selective Debridement - Location: R ischium Selective Debridement - Tools Used: Forceps;Scissors Selective Debridement - Tissue Removed: necrotic tissues/ eschar   Wound Assessment and Plan  Wound Therapy - Assess/Plan/Recommendations Wound Therapy - Clinical Statement: pt's wound will benefit from PLS to help decrease  bacterial load soften tissues in prep for selective debridement to get down to healthy tissues Wound Therapy - Functional Problem List: Decreased mobility Factors Delaying/Impairing Wound Healing: Diabetes Mellitus;Immobility;Tobacco use Hydrotherapy Plan: Debridement;Dressing change;Patient/family education;Pulsatile lavage with suction Wound Therapy - Frequency: 6X / week Wound Therapy - Follow Up Recommendations: Wound Care Center;Home health RN Wound Plan: see above  Wound Therapy Goals- Improve the function of patient's integumentary system by progressing the wound(s) through the phases of wound healing (inflammation - proliferation - remodeling) by: Decrease Necrotic Tissue to: 10% Decrease Necrotic Tissue - Progress: Progressing toward goal Increase Granulation Tissue to: 90% Increase Granulation Tissue - Progress: Progressing toward goal  Goals will be updated until maximal potential achieved or discharge criteria met.  Discharge criteria: when goals achieved, discharge from hospital, MD decision/surgical intervention, no progress towards goals, refusal/missing three consecutive treatments without notification or medical reason.  GP     Despina Pole 08/21/2014, 1:16 PM Carita Pian. Sanjuana Kava, [place] Pager [phone]

## 2014-08-21 NOTE — Progress Notes (Signed)
Patient's rectal tube came out. Spoke with employee for [employer]- he stated that every patient is only allowed 1 rectal tube per month. Patient is not allowed another tube until December. Will continue to wash and clean patient.

## 2014-08-21 NOTE — Progress Notes (Signed)
Patient was having some pain in his back and wanted to wait to complete the chest vest in the morning. RT will continue to monitor.

## 2014-08-21 NOTE — Progress Notes (Signed)
Subjective: Mother not in room this morning.  Patient reports he does not recall any recent hallucinations.  Nursing reports no hallucinations either.    Objective: Current vital signs: BP 131/81 mmHg  Pulse 88  Temp(Src) 99.6 F (37.6 C) (Oral)  Resp 18  Ht 5\' 8"  (1.727 m)  Wt 58.7 kg (129 lb 6.6 oz)  BMI 19.68 kg/m2  SpO2 99% Vital signs in last 24 hours: Temp:  [99 F (37.2 C)-101.3 F (38.5 C)] 99.6 F (37.6 C) (11/13 2011) Pulse Rate:  [88-101] 88 (11/14 0636) Resp:  [16-24] 18 (11/14 0636) BP: (108-131)/(66-88) 131/81 mmHg (11/14 0636) SpO2:  [99 %-100 %] 99 % (11/14 0717)  Intake/Output from previous day: 11/13 0701 - 11/14 0700 In: 666 [P.O.:666] Out: 3851 [Urine:3850; Stool:1] Intake/Output this shift: Total I/O In: 700 [P.O.:700] Out: 601 [Urine:600; Stool:1] Nutritional status: Diet Carb Modified  Neurologic Exam: Awake, alert.  Being fed breakfast.  Oriented.   Cranial Nerves: II: Discs flat bilaterally; Visual fields grossly normal, pupils equal, round, reactive to light and accommodation III,IV, VI: ptosis not present, extra-ocular motions intact bilaterally V,VII: smile symmetric, facial light touch sensation normal bilaterally VIII: hearing normal bilaterally IX,X: gag reflex present XI: bilateral shoulder shrug XII: midline tongue extension   Lab Results: Basic Metabolic Panel:  Recent Labs Lab 08/15/14 0325 08/16/14 0259 08/18/14 0520 08/19/14 0358 08/21/14 0614  NA 142 140 136* 135* 135*  K 3.4* 4.1 3.5* 4.2 4.8  CL 110 107 101 100 98  CO2 20 19 22 22 26   GLUCOSE 127* 181* 143* 341* 335*  BUN 13 7 9 9 8   CREATININE 0.73 0.66 0.67 0.67 0.67  CALCIUM 9.6 9.6 8.7 8.5 9.3    Liver Function Tests:  Recent Labs Lab 08/18/14 0520 08/21/14 0614  AST 8 8  ALT 6 6  ALKPHOS 125* 114  BILITOT 0.3 0.2*  PROT 6.9 7.8  ALBUMIN 2.2* 2.3*   No results for input(s): LIPASE, AMYLASE in the last 168 hours.  Recent Labs Lab  08/18/14 1420  AMMONIA 39    CBC:  Recent Labs Lab 08/15/14 0325 08/18/14 0520 08/19/14 0358 08/20/14 0540 08/21/14 0614  WBC 28.6* 24.9* 27.6* 21.1* 21.2*  NEUTROABS  --   --  19.6*  --   --   HGB 7.6* 7.8* 7.5* 7.0* 8.0*  HCT 23.1* 23.7* 22.9* 21.3* 25.0*  MCV 76.7* 77.2* 80.1 79.8 82.0  PLT 477* 501* 515* 501* 645*    Cardiac Enzymes: No results for input(s): CKTOTAL, CKMB, CKMBINDEX, TROPONINI in the last 168 hours.  Lipid Panel: No results for input(s): CHOL, TRIG, HDL, CHOLHDL, VLDL, LDLCALC in the last 168 hours.  CBG:  Recent Labs Lab 08/20/14 1738 08/20/14 1756 08/20/14 2227 08/21/14 0750 08/21/14 1159  GLUCAP 61* 76 260* 340* 265*    Microbiology: Results for orders placed or performed during the hospital encounter of 08/12/14  Blood Culture (routine x 2)     Status: None   Collection Time: 08/12/14  6:40 PM  Result Value Ref Range Status   Specimen Description BLOOD RIGHT [facility]  Final   Special Requests BOTTLES DRAWN AEROBIC AND ANAEROBIC 5CC EACH  Final   Culture  Setup Time   Final    08/13/2014 01:00 Performed at [facility]    Culture   Final    STAPHYLOCOCCUS AUREUS Note: SUSCEPTIBILITIES PERFORMED ON PREVIOUS CULTURE WITHIN THE LAST 5 DAYS. Note: Gram Stain Report Called to,Read Back By and Verified With: [facility] WORK RN  1050P Performed at [facility]    Report Status 08/16/2014 FINAL  Final  Urine culture     Status: None   Collection Time: 08/12/14  6:40 PM  Result Value Ref Range Status   Specimen Description URINE, CATHETERIZED  Final   Special Requests NONE  Final   Culture  Setup Time   Final    08/13/2014 00:43 Performed at [place]   Final    >=100,000 COLONIES/ML Performed at [facility]    Culture   Final    KLEBSIELLA PNEUMONIAE Performed at [facility]    Report Status 08/16/2014 FINAL  Final   Organism ID, Bacteria KLEBSIELLA PNEUMONIAE  Final       Susceptibility   Klebsiella pneumoniae - MIC*    AMPICILLIN >=32 RESISTANT Resistant     CEFAZOLIN <=4 SENSITIVE Sensitive     CEFTRIAXONE <=1 SENSITIVE Sensitive     CIPROFLOXACIN 0.5 SENSITIVE Sensitive     GENTAMICIN <=1 SENSITIVE Sensitive     LEVOFLOXACIN 2 SENSITIVE Sensitive     NITROFURANTOIN 64 INTERMEDIATE Intermediate     TOBRAMYCIN <=1 SENSITIVE Sensitive     TRIMETH/SULFA <=20 SENSITIVE Sensitive     PIP/TAZO 32 INTERMEDIATE Intermediate     * KLEBSIELLA PNEUMONIAE  Blood Culture (routine x 2)     Status: None   Collection Time: 08/12/14  6:48 PM  Result Value Ref Range Status   Specimen Description BLOOD RIGHT WRIST  Final   Special Requests BOTTLES DRAWN AEROBIC AND ANAEROBIC 10CC EACH  Final   Culture  Setup Time   Final    08/13/2014 00:59 Performed at [facility]    Culture   Final    STAPHYLOCOCCUS AUREUS Note: RIFAMPIN AND GENTAMICIN SHOULD NOT BE USED AS SINGLE DRUGS FOR TREATMENT OF STAPH INFECTIONS. ENTEROCOCCUS SPECIES Note: COMBINATION THERAPY OF HIGH DOSE AMPICILLIN OR VANCOMYCIN, PLUS AN AMINOGLYCOSIDE, IS USUALLY INDICATED FOR SERIOUS ENTEROCOCCAL INFECTIONS. Note: Gram Stain Report Called to,Read Back By and Verified With: Paula Compton RN 700P    Report Status 08/16/2014 FINAL  Final   Organism ID, Bacteria STAPHYLOCOCCUS AUREUS  Final   Organism ID, Bacteria ENTEROCOCCUS SPECIES  Final      Susceptibility   Staphylococcus aureus - MIC*    CLINDAMYCIN >=8 RESISTANT Resistant     ERYTHROMYCIN >=8 RESISTANT Resistant     GENTAMICIN <=0.5 SENSITIVE Sensitive     LEVOFLOXACIN 0.25 SENSITIVE Sensitive     OXACILLIN 1 SENSITIVE Sensitive     PENICILLIN >=0.5 RESISTANT Resistant     RIFAMPIN <=0.5 SENSITIVE Sensitive     TRIMETH/SULFA 20 SENSITIVE Sensitive     VANCOMYCIN <=0.5 SENSITIVE Sensitive     TETRACYCLINE >=16 RESISTANT Resistant     MOXIFLOXACIN* <=0.25 SENSITIVE Sensitive      * SET UP TIME:  [id number]    * STAPHYLOCOCCUS  AUREUS   Enterococcus species - MIC*    AMPICILLIN <=2 SENSITIVE Sensitive     VANCOMYCIN 1 SENSITIVE Sensitive     * ENTEROCOCCUS SPECIES  MRSA PCR Screening     Status: None   Collection Time: 08/13/14 12:43 AM  Result Value Ref Range Status   MRSA by PCR NEGATIVE NEGATIVE Final    Comment:        The GeneXpert MRSA Assay (FDA approved for NASAL specimens only), is one component of a comprehensive MRSA colonization surveillance program. It is not intended to diagnose MRSA infection nor to guide  or monitor treatment for MRSA infections.   Culture, respiratory (NON-Expectorated)     Status: None   Collection Time: 08/13/14 11:52 AM  Result Value Ref Range Status   Specimen Description TRACHEAL ASPIRATE  Final   Special Requests NONE  Final   Gram Stain   Final    FEW WBC PRESENT,BOTH PMN AND MONONUCLEAR RARE SQUAMOUS EPITHELIAL CELLS PRESENT FEW GRAM POSITIVE COCCI IN PAIRS IN CLUSTERS RARE GRAM NEGATIVE RODS Performed at [facility]    Culture   Final    ABUNDANT STAPHYLOCOCCUS AUREUS Note: RIFAMPIN AND GENTAMICIN SHOULD NOT BE USED AS SINGLE DRUGS FOR TREATMENT OF STAPH INFECTIONS. Performed at [facility]    Report Status 08/16/2014 FINAL  Final   Organism ID, Bacteria STAPHYLOCOCCUS AUREUS  Final      Susceptibility   Staphylococcus aureus - MIC*    CLINDAMYCIN >=8 RESISTANT Resistant     ERYTHROMYCIN >=8 RESISTANT Resistant     GENTAMICIN <=0.5 SENSITIVE Sensitive     LEVOFLOXACIN 0.25 SENSITIVE Sensitive     OXACILLIN 1 SENSITIVE Sensitive     PENICILLIN >=0.5 RESISTANT Resistant     RIFAMPIN <=0.5 SENSITIVE Sensitive     TRIMETH/SULFA <=10 SENSITIVE Sensitive     VANCOMYCIN 1 SENSITIVE Sensitive     TETRACYCLINE >=16 RESISTANT Resistant     MOXIFLOXACIN <=0.25 SENSITIVE Sensitive     * ABUNDANT STAPHYLOCOCCUS AUREUS  Culture, blood (routine x 2)     Status: None (Preliminary result)   Collection Time: 08/14/14  3:20 PM  Result Value Ref  Range Status   Specimen Description BLOOD RIGHT ARM  Final   Special Requests BOTTLES DRAWN AEROBIC ONLY [place]  Final   Culture  Setup Time   Final    08/15/2014 00:57 Performed at [facility]    Culture   Final           BLOOD CULTURE RECEIVED NO GROWTH TO DATE CULTURE WILL BE HELD FOR 5 DAYS BEFORE ISSUING A FINAL NEGATIVE REPORT Performed at [facility]    Report Status PENDING  Incomplete  Culture, blood (routine x 2)     Status: None (Preliminary result)   Collection Time: 08/14/14  3:30 PM  Result Value Ref Range Status   Specimen Description BLOOD RIGHT HAND  Final   Special Requests BOTTLES DRAWN AEROBIC ONLY 4CC  Final   Culture  Setup Time   Final    08/15/2014 00:57 Performed at [facility]    Culture   Final           BLOOD CULTURE RECEIVED NO GROWTH TO DATE CULTURE WILL BE HELD FOR 5 DAYS BEFORE ISSUING A FINAL NEGATIVE REPORT Performed at [facility]    Report Status PENDING  Incomplete  Clostridium Difficile by PCR     Status: None   Collection Time: 08/15/14  2:12 PM  Result Value Ref Range Status   C difficile by pcr NEGATIVE NEGATIVE Final  Clostridium Difficile by PCR     Status: None   Collection Time: 08/19/14  1:14 PM  Result Value Ref Range Status   C difficile by pcr NEGATIVE NEGATIVE Final  Urine culture     Status: None   Collection Time: 08/19/14  4:46 PM  Result Value Ref Range Status   Specimen Description URINE, CATHETERIZED  Final   Special Requests NONE  Final   Culture  Setup Time   Final    08/19/2014 22:41 Performed at [facility]    Colony Count NO GROWTH Performed at [facility]   Final   Culture NO GROWTH Performed at [facility]   Final   Report Status 08/20/2014 FINAL  Final    Coagulation Studies: No results for input(s): LABPROT, INR in the last 72 hours.  Imaging: Dg Chest 2 View  08/19/2014   CLINICAL DATA:  Fever, weakness  EXAM: CHEST  2 VIEW   COMPARISON:  08/14/2014  FINDINGS: Cardiomediastinal silhouette is stable. No pulmonary edema. There is streaky right middle lobe and left basilar atelectasis or infiltrate. No pulmonary edema. Question small left pleural effusion.  IMPRESSION: Streaky right middle lobe and left basilar atelectasis or infiltrate. Question small left pleural effusion. No pulmonary edema.   Electronically Signed   By: Lahoma Crocker M.D.   On: 08/19/2014 12:58    Medications:  I have reviewed the patient's current medications. Scheduled: . albuterol  2.5 mg Nebulization TID  . atorvastatin  20 mg Oral Daily  . budesonide  0.25 mg Nebulization Q6H  . collagenase   Topical Daily  . feeding supplement (GLUCERNA SHAKE)  237 mL Oral TID BM  . feeding supplement (PRO-STAT SUGAR FREE 64)  30 mL Oral Daily  . heparin subcutaneous  5,000 Units Subcutaneous 3 times per day  . insulin aspart  0-15 Units Subcutaneous TID WC  . insulin aspart  4 Units Subcutaneous TID WC  . insulin glargine  15 Units Subcutaneous Daily  . pantoprazole  40 mg Oral Q1200  . pregabalin  100 mg Oral TID  . sodium chloride  3 mL Intravenous Q12H  . vancomycin  750 mg Intravenous Q12H    Assessment/Plan: Appears to be experiencing some improvement mental status.    Recommendations: 1.  Follow up MRI in 1-2 months   LOS: 9 days   Alexis Goodell, MD [facility] [phone] 08/21/2014  12:01 PM

## 2014-08-22 DIAGNOSIS — E1065 Type 1 diabetes mellitus with hyperglycemia: Secondary | ICD-10-CM

## 2014-08-22 LAB — BASIC METABOLIC PANEL
Anion gap: 17 — ABNORMAL HIGH (ref 5–15)
BUN: 18 mg/dL (ref 6–23)
CALCIUM: 9.2 mg/dL (ref 8.4–10.5)
CHLORIDE: 90 meq/L — AB (ref 96–112)
CO2: 16 meq/L — AB (ref 19–32)
CREATININE: 0.76 mg/dL (ref 0.50–1.35)
GFR calc Af Amer: >90 mL/min (ref >90)
GFR calc non Af Amer: >90 mL/min (ref >90)
GLUCOSE: 597 mg/dL — AB (ref 70–99)
Potassium: 5 mEq/L (ref 3.7–5.3)
Sodium: 123 mEq/L — ABNORMAL LOW (ref 137–147)

## 2014-08-22 LAB — GLUCOSE, CAPILLARY
GLUCOSE-CAPILLARY: 555 mg/dL — AB (ref 70–99)
GLUCOSE-CAPILLARY: 114 mg/dL — AB (ref 70–99)
GLUCOSE-CAPILLARY: 344 mg/dL — AB (ref 70–99)
Glucose-Capillary: 505 mg/dL — ABNORMAL HIGH (ref 70–99)
Glucose-Capillary: 519 mg/dL — ABNORMAL HIGH (ref 70–99)
Glucose-Capillary: 320 mg/dL — ABNORMAL HIGH (ref 70–99)
Glucose-Capillary: 47 mg/dL — ABNORMAL LOW (ref 70–99)
Glucose-Capillary: 55 mg/dL — ABNORMAL LOW (ref 70–99)

## 2014-08-22 LAB — CBC
HEMATOCRIT: 25.6 % — AB (ref 39.0–52.0)
Hemoglobin: 8 g/dL — ABNORMAL LOW (ref 13.0–17.0)
MCH: 26.1 pg (ref 26.0–34.0)
MCHC: 31.3 g/dL (ref 30.0–36.0)
MCV: 83.4 fL (ref 78.0–100.0)
Platelets: 696 10*3/uL — ABNORMAL HIGH (ref 150–400)
RBC: 3.07 MIL/uL — ABNORMAL LOW (ref 4.22–5.81)
RDW: 16.7 % — AB (ref 11.5–15.5)
WBC: 29.3 10*3/uL — ABNORMAL HIGH (ref 4.0–10.5)

## 2014-08-22 LAB — VANCOMYCIN, TROUGH: Vancomycin Tr: 27.9 ug/mL (ref 10.0–20.0)

## 2014-08-22 MED ORDER — INSULIN ASPART 100 UNIT/ML ~~LOC~~ SOLN
0.0000 [IU] | Freq: Three times a day (TID) | SUBCUTANEOUS | Status: DC
  Administered 2014-08-22: 15 [IU] via SUBCUTANEOUS
  Administered 2014-08-23: 7 [IU] via SUBCUTANEOUS
  Administered 2014-08-23: 11 [IU] via SUBCUTANEOUS
  Administered 2014-08-23: 7 [IU] via SUBCUTANEOUS

## 2014-08-22 MED ORDER — VANCOMYCIN HCL 10 G IV SOLR
1250.0000 mg | INTRAVENOUS | Status: DC
  Administered 2014-08-23 – 2014-08-26 (×4): 1250 mg via INTRAVENOUS
  Filled 2014-08-22 (×7): qty 1250

## 2014-08-22 MED ORDER — LORAZEPAM 2 MG/ML IJ SOLN
0.5000 mg | Freq: Once | INTRAMUSCULAR | Status: AC
Start: 2014-08-22 — End: 2014-08-22
  Administered 2014-08-22: 0.5 mg via INTRAVENOUS
  Filled 2014-08-22: qty 1

## 2014-08-22 MED ORDER — INSULIN ASPART 100 UNIT/ML ~~LOC~~ SOLN
7.0000 [IU] | Freq: Three times a day (TID) | SUBCUTANEOUS | Status: DC
  Administered 2014-08-22: 7 [IU] via SUBCUTANEOUS

## 2014-08-22 MED ORDER — INSULIN ASPART 100 UNIT/ML ~~LOC~~ SOLN: 0.0000 [IU] | Freq: Every day | SUBCUTANEOUS | Status: DC

## 2014-08-22 MED ORDER — LORAZEPAM 0.5 MG PO TABS
0.5000 mg | ORAL_TABLET | Freq: Three times a day (TID) | ORAL | Status: DC | PRN
Start: 2014-08-22 — End: 2014-08-26
  Administered 2014-08-22 – 2014-08-25 (×4): 0.5 mg via ORAL
  Filled 2014-08-22 (×5): qty 1

## 2014-08-22 MED ORDER — SODIUM CHLORIDE 0.9 % IV SOLN
INTRAVENOUS | Status: DC
  Administered 2014-08-22 (×2): via INTRAVENOUS

## 2014-08-22 MED ORDER — INSULIN GLARGINE 100 UNIT/ML ~~LOC~~ SOLN
25.0000 [IU] | Freq: Every day | SUBCUTANEOUS | Status: DC
  Administered 2014-08-23: 25 [IU] via SUBCUTANEOUS
  Filled 2014-08-22: qty 0.25

## 2014-08-22 MED ORDER — INSULIN GLARGINE 100 UNIT/ML ~~LOC~~ SOLN
5.0000 [IU] | Freq: Once | SUBCUTANEOUS | Status: AC
  Administered 2014-08-22: 5 [IU] via SUBCUTANEOUS
  Filled 2014-08-22: qty 0.05

## 2014-08-22 MED ORDER — INSULIN ASPART 100 UNIT/ML ~~LOC~~ SOLN
27.0000 [IU] | Freq: Once | SUBCUTANEOUS | Status: AC
  Administered 2014-08-22: 27 [IU] via SUBCUTANEOUS

## 2014-08-22 MED ORDER — SODIUM CHLORIDE 0.9 % IV BOLUS (SEPSIS)
500.0000 mL | Freq: Once | INTRAVENOUS | Status: AC
  Administered 2014-08-22: 500 mL via INTRAVENOUS

## 2014-08-22 MED ORDER — INSULIN ASPART 100 UNIT/ML ~~LOC~~ SOLN
20.0000 [IU] | Freq: Once | SUBCUTANEOUS | Status: AC
  Administered 2014-08-22: 20 [IU] via SUBCUTANEOUS

## 2014-08-22 NOTE — Progress Notes (Signed)
Critical level called from lab Vancomycin trough at 27.9.

## 2014-08-22 NOTE — Progress Notes (Signed)
ANTIBIOTIC CONSULT NOTE - Follow Up  Pharmacy Consult for vancomycin Indication: sepsis  Allergies  Allergen Reactions  . Cefuroxime Axetil Anaphylaxis  . Penicillins Anaphylaxis and Other (See Comments)    ?can take amoxicillin?  Jason [name] [Benzonatate] Anaphylaxis  . Shellfish Allergy Itching and Other (See Comments)    Took benadryl to alleviate reaction   Patient Measurements: Height: 5\' 8"  (172.7 cm) Weight: 129 lb 6.6 oz (58.7 kg) IBW/kg (Calculated) : 68.4  Vital Signs: Temp: 98.3 F (36.8 C) (11/15 0538) Temp Source: Axillary (11/15 0538) BP: 108/67 mmHg (11/15 0538) Pulse Rate: 92 (11/15 0538) Intake/Output from previous day: 11/14 0701 - 11/15 0700 In: 1940 [P.O.:1940] Out: 2302 [Urine:2300; Stool:2] Intake/Output from this shift: Total I/O In: 580 [P.O.:580] Out: -   Labs:  Recent Labs  08/20/14 0540 08/21/14 0614 08/22/14 0435 08/22/14 0733  WBC 21.1* 21.2* 29.3*  --   HGB 7.0* 8.0* 8.0*  --   PLT 501* 645* 696*  --   CREATININE  --  0.67  --  0.76   Estimated Creatinine Clearance: 112.1 mL/min (by C-G formula based on Cr of 0.76).  Recent Labs  08/22/14 1025  VANCOTROUGH 27.9*    Medical History: Past Medical History  Diagnosis Date  . GERD (gastroesophageal reflux disease)   . Asthma   . Hx MRSA infection     on face  . Gastroparesis   . Diabetic neuropathy   . Seizures   . Stroke     spinal stroke in 4/15  . Diabetes mellitus     sees Dr. Loanne [name]   . Family history of anesthesia complication     Pt mother can't have epidural procedures  . Dysrhythmia   . Pneumonia   . Arthritis   . Fibromyalgia    Assessment: Jason [name] continues on antibiotics for bacteremia.  He is not clearing the Vancomycin as expected and seems to be accumulating.  I will adjust his regimen to provide a 24 hour dosing regimen for now but this may need to be changed to every 18 hours if he clears this better than expected.    Vanc Trough Summary: 11/8  VT = 13.2 mcg/ml on Vanc. 500mg  every 12 hours 11/11 VT = 20.6 mcg/ml on Vanc. 750mg  every 12 hours 11/15 VT = 27.9 mcg/ml on Vanc 750mg  every 12 hours    Goal of Therapy:  Vancomycin trough level 15-20 mcg/ml  Plan:  Change IV Vancomycin to 1250 mg IV q 24 hours Monitor renal fx closely and signs of accumulation  Repeat trough as indicated  Jason [name], PharmD., MS Clinical Pharmacist Pager:  [phone] Thank you for allowing pharmacy to be part of this patients care team. 08/22/2014 11:46 AM

## 2014-08-22 NOTE — Progress Notes (Signed)
Physician notified of CBG of 519. New insulin orders received. Will continue to monitor.

## 2014-08-22 NOTE — Progress Notes (Signed)
On-call provider notified of pt CBG 555. Orders given for 20 units of NovoLog. Insulin given. Will continue to monitor.

## 2014-08-22 NOTE — Progress Notes (Addendum)
PROGRESS NOTE  Jason [name] UDJ:[id number] DOB: November 09, 1985 DOA: 08/12/2014 PCP: [name], MD  Assessment/Plan: MSSA bacteremia  - he had a TEE 08/17/14 without vegetations. Continue vancomycin. - Infectious diseases consulted, appreciate input -08/14/2014 surveillance blood cultures negative -suspected source is right hip decubitus ulcer -PICC line--as the patient's surveillance blood cultures have been negative greater than 5 days  Right hip decubitus ulcer -MRI right hip reveals complex fluid collection -Consulted general surgery for opinion--appreciate followup -general surgery noted that no actual pocket of purulent drainage is present after bedside debridement/hydrotherapy. Cont hydrotherapy and bedside debridements to clean this wound up.  Fever -Chest x-ray shows patchy opacities right middle lobe, left base with unchanged small left pleural effusion -08/19/2014--new Foley catheter placed--> Repeat UA and urine culture after new Foley placed -08/15/2014 and 08/19/14--C. Difficile PCR negative -08/20/14--fever 101.29F -08/21/2014--temperature improving after necrotic hip Wound now debrided with hydrotherapy -although patient has pyuria on the most recent UA (08/18/2014), urine culture is negative--will not start additional antibiotics -08/22/2014--with increasing WBC and relative  troponins blood pressures signifying a degree of autonomic dysautonomia-->repeat blood cultures  Acute hypoxic respiratory failure - status post intubation, now on nasal cannula - Continue nebulizers with albuterol and Pulmicort -presently stable on 2 L nasal cannula--flutter valve -08/19/2014 chest x-ray shows right middle lobe and left basilar atelectasis with small left effusion  Acute encephalopathy -Multifactorial including the patient's sepsis/infectious process, opioids, hypoxemia -MR brain--new edema signal in the splenium of corpus callosum--> May be related to the  patient's acute respiratory failure -Consulted neurology for opinion--appreciate Dr. [name] may be secondary to his episodes of hypoglycemia and hypoxia and injury related to the same. Doubt infection -Although less likely, we'll check serum B12, ammonia, TSH, HIV--all unremarkable -08/20/2014--patient developed an episode of decreased responsiveness--> CBG 136, ABG showed 7.45/37/90/26 on 2 L,--suspect due to opioids and benzodiazepines--patient subsequently became more alert back to his baseline spontaneously -Discontinue opioids and Ativan -08/21/2014--mentation is back to baseline according to the patient's fiance at the bedside Hypertension  -discontinue antihypertensive medications as the patient's blood pressure is soft -blood pressure remains labile, but stable off of antihypertensive medication  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 - Stable, continue PT  Enterococcal bacteremia - suspect Contaminant  Diabetes mellitus - continue Lantus daily and sliding scale insulin - Continue Lyrica for neuropathy -CBGs increasing to 500s today due to pt eating outside snacks today -Increase NovoLog 7 units with meals -increase lantus to 25 units daily -start IVF -Later in the day 08/22/2014--patient's CBGs dropped into the 50s--> continue Lantus, but will discontinue pre-meal NovoLog -check hemoglobin A1c HLD - continue Lipitor  Chronic pain - d/c narcotics due to encephalopathy   Family Communication: father updated at the bedside 08/22/2014 Disposition Plan: Home when medically stable          Procedures/Studies: Dg Chest 2 View  08/19/2014   CLINICAL DATA:  Fever, weakness  EXAM: CHEST  2 VIEW  COMPARISON:  08/14/2014  FINDINGS: Cardiomediastinal silhouette is stable. No pulmonary edema. There is streaky right middle lobe and left basilar atelectasis or infiltrate. No pulmonary edema. Question small left pleural  effusion.  IMPRESSION: Streaky right middle lobe and left basilar atelectasis or infiltrate. Question small left pleural effusion. No pulmonary edema.   Electronically Signed   By: Lahoma Crocker M.D.   On: 08/19/2014 12:58   Dg Chest 2 View  07/29/2014   CLINICAL DATA:  Hypotension, history of stroke, left arm weakness  EXAM: CHEST  2 VIEW  COMPARISON:  07/18/2014  FINDINGS: Cardiomediastinal silhouette is stable. No acute infiltrate or pleural effusion. No pulmonary edema. Stable old fracture deformity distal left clavicle.  IMPRESSION: No active cardiopulmonary disease.   Electronically Signed   By: Lahoma Crocker M.D.   On: 07/29/2014 17:54   Ct Head Wo Contrast  07/29/2014   CLINICAL DATA:  Hypertension.  Previous stroke.  EXAM: CT HEAD WITHOUT CONTRAST  TECHNIQUE: Contiguous axial images were obtained from the base of the skull through the vertex without intravenous contrast.  COMPARISON:  04/27/2014.  Brain MR dated 02/04/2014.  FINDINGS: Previously noted Chiari 1 malformation. Otherwise, normal appearing cerebral hemispheres and posterior fossa structures. Normal size and position of the ventricles. No intracranial hemorrhage, mass lesion or CT evidence of acute infarction. Unremarkable bones and included paranasal sinuses.  IMPRESSION: No acute abnormality.  Previously noted Chiari 1 malformation.   Electronically Signed   By: Enrique Sack M.D.   On: 07/29/2014 20:57   Mr Jeri Cos [place] Contrast  08/17/2014   CLINICAL DATA:  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 Confusion with fevers and sepsis. Assess for CNS infection.  EXAM: MRI HEAD WITHOUT AND WITH CONTRAST  TECHNIQUE: Multiplanar, multiecho pulse sequences of the brain and surrounding structures were obtained without and with intravenous contrast.  CONTRAST:  66mL MULTIHANCE GADOBENATE DIMEGLUMINE 529 MG/ML IV SOLN  COMPARISON:  Head CT 07/29/2014.  MRI 01/29/2014.  FINDINGS: The brainstem and cerebellum are normal. The cerebral hemispheres again  show a few foci of chronic T2 signal within the white matter, possibly related to old shear injuries. Newly seen is edema in the splenium of the corpus callosum, more on the left than the right. This is associated with low level restricted diffusion. There is no contrast enhancement. No other active pathologic finding. After contrast administration, there is no abnormal enhancement of the brain or leptomeninges, including of the splenium of the corpus callosum  No evidence of mass lesion, hemorrhage, hydrocephalus or extra-axial collection. No pituitary mass. Sinuses are clear.  IMPRESSION: No evidence of intracranial abscess. The patient does have the new finding of edema signal within the splenium of the corpus callosum. The differential diagnosis includes the presenting lesion of multiple sclerosis, demyelination secondary to vitamin B12 deficiency (Marchiafava-Bignami disease), infarction due to hypoxia, and reversible lesions of the splenium associated with various infectious agents including influenza, rotavirus, E coli, and adenovirus. Other transient lesions can be caused by seizure, posterior reversible encephalopathy and hypoglycemia.   Electronically Signed   By: Nelson Chimes M.D.   On: 08/17/2014 19:26   Mr Hip Right W Wo Contrast  08/17/2014   CLINICAL DATA:  Quadriplegic, status post cord infarct, evaluate for osteomyelitis of the right hip from decubitus ulcer  EXAM: MRI PELVIS WITHOUT AND WITH CONTRAST  TECHNIQUE: Multiplanar multisequence MR imaging of the pelvis was performed both before and after administration of intravenous contrast.  CONTRAST:  58mL MULTIHANCE GADOBENATE DIMEGLUMINE 529 MG/ML IV SOLN  COMPARISON:  None.  FINDINGS: There is no focal marrow signal abnormality of bilateral hips. There is no fracture or dislocation. There is no avascular necrosis. Bilateral sacroiliac joints are normal. There is no joint effusion. There is no gross labral or paralabral abnormality.  There is a  decubitus ulcer inferior to the right ischial tuberosity with a small amount air within the ulcer versus packing material. There is a mildly complex fluid collection extending from the decubitus  ulcer to the posterior aspect of the right inferior pubic ramus measuring 1.8 x 1.6 x 3.7 cm. There is no definite cortical destruction. There is mild increased T2 signal within the posterior aspect of the right inferior pubic ramus just medial to the ischial tuberosity, which may be reactive versus early osteomyelitis.  There is diffuse mild increased T2 signal throughout the proximal thigh and pelvic musculature which is likely neurogenic.  There is severe bladder wall thickening with a Foley catheter present. The bladder wall thickening is likely secondary to neurogenic bladder.  IMPRESSION: 1. There is a decubitus ulcer inferior to the right ischial tuberosity with a small amount air within the ulcer versus packing material. There is a mildly complex fluid collection extending from the decubitus ulcer to the posterior aspect of the right inferior pubic ramus measuring 1.8 x 1.6 x 3.7 cm without definite cortical destruction. There is mild increased T2 signal within the posterior aspect of the right inferior pubic ramus just medial to the ischial tuberosity, which may be reactive versus early osteomyelitis.   Electronically Signed   By: Kathreen Devoid   On: 08/17/2014 20:36   Dg Chest Port 1 View  08/14/2014   CLINICAL DATA:  Respiratory failure.  EXAM: PORTABLE CHEST - 1 VIEW  COMPARISON:  08/13/2014  FINDINGS: Endotracheal tube tip between the clavicular heads and carina. Gastric suction tube reaches the stomach.  Airspace disease in the left lower lobe is again noted, still extensive but less dense. Small left pleural effusion. Medial right basilar and right upper lobe (near the minor fissure) airspace disease again seen. No evidence of cavitation or pneumothorax.  IMPRESSION: 1. Bilateral pneumonia and small left  pleural effusion. Aeration in the left lower lobe has improved since yesterday. 2. Endotracheal and orogastric tubes remain in good position.   Electronically Signed   By: Jorje Guild M.D.   On: 08/14/2014 07:47   Dg Chest Port 1 View  08/13/2014   CLINICAL DATA:  Intubation.  EXAM: PORTABLE CHEST - 1 VIEW  COMPARISON:  08/12/2014  FINDINGS: Interval placement of endotracheal tube with tip measuring 4.9 cm above the carinal. Enteric tube was placed. Tip is off the field of view but below the left hemidiaphragm. Heart size and pulmonary vascularity are normal. There is developing small left pleural effusion with consolidation in the left lung base suggesting pneumonia.  IMPRESSION: Appliances appear in satisfactory location. Increasing consolidation and effusion in the left lung base.   Electronically Signed   By: Lucienne Capers M.D.   On: 08/13/2014 06:11   Dg Chest Port 1 View  08/12/2014   CLINICAL DATA:  Fever R50.9 (ICD-10-CM). Altered mental status. Recent dental surgery.  EXAM: PORTABLE CHEST - 1 VIEW  COMPARISON:  07/29/2014  FINDINGS: New subtle densities at the left lung base. Heart size is normal. Trachea is midline. Negative for a pneumothorax.  IMPRESSION: Subtle densities at the left lung base. Findings most likely represent atelectasis.   Electronically Signed   By: Markus Daft M.D.   On: 08/12/2014 19:08         Subjective: Patient complains of some nausea but denies any fevers, chills, chest pain, shortness breath, vomiting, abdominal pain, diarrhea. He did have a bowel movement.  Objective: Filed Vitals:   08/22/14 0250 08/22/14 0538 08/22/14 0749 08/22/14 1344  BP:  108/67    Pulse:  92    Temp:  98.3 F (36.8 C)    TempSrc:  Axillary    Resp:  20    Height:      Weight:      SpO2: 98% 100% 99% 99%    Intake/Output Summary (Last 24 hours) at 08/22/14 1349 Last data filed at 08/22/14 2130  Gross per 24 hour  Intake   1820 ml  Output   1700 ml  Net    120 ml     Weight change:  Exam:   General:  Pt is alert, follows commands appropriately, not in acute distress  HEENT: No icterus, No thrush,[name]/AT  Cardiovascular: RRR, S1/S2, no rubs, no gallops  Respiratory: bibasilar rales without wheezing. Good air movement.  Abdomen: Soft/+BS, non tender, non distended, no guarding  Extremities: No edema, No lymphangitis, No petechiae, No rashes, no synovitis  Data Reviewed: Basic Metabolic Panel:  Recent Labs Lab 08/16/14 0259 08/18/14 0520 08/19/14 0358 08/21/14 0614 08/22/14 0733  NA 140 136* 135* 135* 123*  K 4.1 3.5* 4.2 4.8 5.0  CL 107 101 100 98 90*  CO2 19 22 22 26  16*  GLUCOSE 181* 143* 341* 335* 597*  BUN 7 9 9 8 18   CREATININE 0.66 0.67 0.67 0.67 0.76  CALCIUM 9.6 8.7 8.5 9.3 9.2   Liver Function Tests:  Recent Labs Lab 08/18/14 0520 08/21/14 0614  AST 8 8  ALT 6 6  ALKPHOS 125* 114  BILITOT 0.3 0.2*  PROT 6.9 7.8  ALBUMIN 2.2* 2.3*   No results for input(s): LIPASE, AMYLASE in the last 168 hours.  Recent Labs Lab 08/18/14 1420  AMMONIA 39   CBC:  Recent Labs Lab 08/18/14 0520 08/19/14 0358 08/20/14 0540 08/21/14 0614 08/22/14 0435  WBC 24.9* 27.6* 21.1* 21.2* 29.3*  NEUTROABS  --  19.6*  --   --   --   HGB 7.8* 7.5* 7.0* 8.0* 8.0*  HCT 23.7* 22.9* 21.3* 25.0* 25.6*  MCV 77.2* 80.1 79.8 82.0 83.4  PLT 501* 515* 501* 645* 696*   Cardiac Enzymes: No results for input(s): CKTOTAL, CKMB, CKMBINDEX, TROPONINI in the last 168 hours. BNP: Invalid input(s): POCBNP CBG:  Recent Labs Lab 08/21/14 2124 08/22/14 0638 08/22/14 0809 08/22/14 0823 08/22/14 1202  GLUCAP 241* 555* 519* 505* 320*    Recent Results (from the past 240 hour(s))  Blood Culture (routine x 2)     Status: None   Collection Time: 08/12/14  6:40 PM  Result Value Ref Range Status   Specimen Description BLOOD RIGHT [facility]  Final   Special Requests BOTTLES DRAWN AEROBIC AND ANAEROBIC 5CC EACH  Final   Culture  Setup Time   Final     08/13/2014 01:00 Performed at [facility]    Culture   Final    STAPHYLOCOCCUS AUREUS Note: SUSCEPTIBILITIES PERFORMED ON PREVIOUS CULTURE WITHIN THE LAST 5 DAYS. Note: Gram Stain Report Called to,Read Back By and Verified With: [facility] WORK RN [phone] Performed at [facility]    Report Status 08/16/2014 FINAL  Final  Urine culture     Status: None   Collection Time: 08/12/14  6:40 PM  Result Value Ref Range Status   Specimen Description URINE, CATHETERIZED  Final   Special Requests NONE  Final   Culture  Setup Time   Final    08/13/2014 00:43 Performed at [place]   Final    >=100,000 COLONIES/ML Performed at [place] Performed at [facility]    Report Status  08/16/2014 FINAL  Final   Organism ID, Bacteria KLEBSIELLA PNEUMONIAE  Final      Susceptibility   Klebsiella pneumoniae - MIC*    AMPICILLIN >=32 RESISTANT Resistant     CEFAZOLIN <=4 SENSITIVE Sensitive     CEFTRIAXONE <=1 SENSITIVE Sensitive     CIPROFLOXACIN 0.5 SENSITIVE Sensitive     GENTAMICIN <=1 SENSITIVE Sensitive     LEVOFLOXACIN 2 SENSITIVE Sensitive     NITROFURANTOIN 64 INTERMEDIATE Intermediate     TOBRAMYCIN <=1 SENSITIVE Sensitive     TRIMETH/SULFA <=20 SENSITIVE Sensitive     PIP/TAZO 32 INTERMEDIATE Intermediate     * KLEBSIELLA PNEUMONIAE  Blood Culture (routine x 2)     Status: None   Collection Time: 08/12/14  6:48 PM  Result Value Ref Range Status   Specimen Description BLOOD RIGHT [facility]  Final   Special Requests BOTTLES DRAWN AEROBIC AND ANAEROBIC 10CC EACH  Final   Culture  Setup Time   Final    08/13/2014 00:59 Performed at [facility]    Culture   Final    STAPHYLOCOCCUS AUREUS Note: RIFAMPIN AND GENTAMICIN SHOULD NOT BE USED AS SINGLE DRUGS FOR TREATMENT OF STAPH INFECTIONS. ENTEROCOCCUS SPECIES Note: COMBINATION THERAPY OF HIGH DOSE AMPICILLIN OR VANCOMYCIN,  PLUS AN AMINOGLYCOSIDE, IS USUALLY INDICATED FOR SERIOUS ENTEROCOCCAL INFECTIONS. Note: Gram Stain Report Called to,Read Back By and Verified With: Paula Compton RN 700P    Report Status 08/16/2014 FINAL  Final   Organism ID, Bacteria STAPHYLOCOCCUS AUREUS  Final   Organism ID, Bacteria ENTEROCOCCUS SPECIES  Final      Susceptibility   Staphylococcus aureus - MIC*    CLINDAMYCIN >=8 RESISTANT Resistant     ERYTHROMYCIN >=8 RESISTANT Resistant     GENTAMICIN <=0.5 SENSITIVE Sensitive     LEVOFLOXACIN 0.25 SENSITIVE Sensitive     OXACILLIN 1 SENSITIVE Sensitive     PENICILLIN >=0.5 RESISTANT Resistant     RIFAMPIN <=0.5 SENSITIVE Sensitive     TRIMETH/SULFA 20 SENSITIVE Sensitive     VANCOMYCIN <=0.5 SENSITIVE Sensitive     TETRACYCLINE >=16 RESISTANT Resistant     MOXIFLOXACIN* <=0.25 SENSITIVE Sensitive      * SET UP TIME:  [id number]    * STAPHYLOCOCCUS AUREUS   Enterococcus species - MIC*    AMPICILLIN <=2 SENSITIVE Sensitive     VANCOMYCIN 1 SENSITIVE Sensitive     * ENTEROCOCCUS SPECIES  MRSA PCR Screening     Status: None   Collection Time: 08/13/14 12:43 AM  Result Value Ref Range Status   MRSA by PCR NEGATIVE NEGATIVE Final    Comment:        The GeneXpert MRSA Assay (FDA approved for NASAL specimens only), is one component of a comprehensive MRSA colonization surveillance program. It is not intended to diagnose MRSA infection nor to guide or monitor treatment for MRSA infections.   Culture, respiratory (NON-Expectorated)     Status: None   Collection Time: 08/13/14 11:52 AM  Result Value Ref Range Status   Specimen Description TRACHEAL ASPIRATE  Final   Special Requests NONE  Final   Gram Stain   Final    FEW WBC PRESENT,BOTH PMN AND MONONUCLEAR RARE SQUAMOUS EPITHELIAL CELLS PRESENT FEW GRAM POSITIVE COCCI IN PAIRS IN CLUSTERS RARE GRAM NEGATIVE RODS Performed at [facility]    Culture   Final    ABUNDANT STAPHYLOCOCCUS AUREUS Note: RIFAMPIN  AND GENTAMICIN SHOULD NOT BE USED AS SINGLE DRUGS FOR TREATMENT OF STAPH INFECTIONS.  Performed at [facility]    Report Status 08/16/2014 FINAL  Final   Organism ID, Bacteria STAPHYLOCOCCUS AUREUS  Final      Susceptibility   Staphylococcus aureus - MIC*    CLINDAMYCIN >=8 RESISTANT Resistant     ERYTHROMYCIN >=8 RESISTANT Resistant     GENTAMICIN <=0.5 SENSITIVE Sensitive     LEVOFLOXACIN 0.25 SENSITIVE Sensitive     OXACILLIN 1 SENSITIVE Sensitive     PENICILLIN >=0.5 RESISTANT Resistant     RIFAMPIN <=0.5 SENSITIVE Sensitive     TRIMETH/SULFA <=10 SENSITIVE Sensitive     VANCOMYCIN 1 SENSITIVE Sensitive     TETRACYCLINE >=16 RESISTANT Resistant     MOXIFLOXACIN <=0.25 SENSITIVE Sensitive     * ABUNDANT STAPHYLOCOCCUS AUREUS  Culture, blood (routine x 2)     Status: None   Collection Time: 08/14/14  3:20 PM  Result Value Ref Range Status   Specimen Description BLOOD RIGHT ARM  Final   Special Requests BOTTLES DRAWN AEROBIC ONLY [place]  Final   Culture  Setup Time   Final    08/15/2014 00:57 Performed at [facility]    Culture   Final    NO GROWTH 5 DAYS Performed at [facility]    Report Status 08/21/2014 FINAL  Final  Culture, blood (routine x 2)     Status: None   Collection Time: 08/14/14  3:30 PM  Result Value Ref Range Status   Specimen Description BLOOD RIGHT HAND  Final   Special Requests BOTTLES DRAWN AEROBIC ONLY 4CC  Final   Culture  Setup Time   Final    08/15/2014 00:57 Performed at [place]   Final    NO GROWTH 5 DAYS Performed at [facility]    Report Status 08/21/2014 FINAL  Final  Clostridium Difficile by PCR     Status: None   Collection Time: 08/15/14  2:12 PM  Result Value Ref Range Status   C difficile by pcr NEGATIVE NEGATIVE Final  Clostridium Difficile by PCR     Status: None   Collection Time: 08/19/14  1:14 PM  Result Value Ref Range Status   C difficile by pcr NEGATIVE NEGATIVE  Final  Urine culture     Status: None   Collection Time: 08/19/14  4:46 PM  Result Value Ref Range Status   Specimen Description URINE, CATHETERIZED  Final   Special Requests NONE  Final   Culture  Setup Time   Final    08/19/2014 22:41 Performed at [place] Performed at [facility]   Final   Culture NO GROWTH Performed at [facility]   Final   Report Status 08/20/2014 FINAL  Final     Scheduled Meds: . albuterol  2.5 mg Nebulization TID  . atorvastatin  20 mg Oral Daily  . budesonide  0.25 mg Nebulization Q6H  . collagenase   Topical Daily  . feeding supplement (GLUCERNA SHAKE)  237 mL Oral TID BM  . feeding supplement (PRO-STAT SUGAR FREE 64)  30 mL Oral Daily  . heparin subcutaneous  5,000 Units Subcutaneous 3 times per day  . insulin aspart  0-20 Units Subcutaneous TID WC  . insulin aspart  0-5 Units Subcutaneous QHS  . insulin aspart  7 Units Subcutaneous TID WC  . [START ON 08/23/2014] insulin glargine  25 Units Subcutaneous Daily  . pantoprazole  40 mg Oral Q1200  .  pregabalin  100 mg Oral TID  . sodium chloride  3 mL Intravenous Q12H  . [START ON 08/23/2014] vancomycin  1,250 mg Intravenous Q24H   Continuous Infusions: . sodium chloride 10 mL/hr at 08/21/14 2326  . sodium chloride       Hadi Dubin, DO  [facility] Pager [phone]  If 7PM-7AM, please contact night-coverage [email or web address] Password [email or web address] 08/22/2014, 1:49 PM   LOS: 10 days

## 2014-08-22 NOTE — Progress Notes (Signed)
Patient has refused the chest vest for tonight. Patient states he does not feel like doing the CPT at this moment. Patient is currently resting comfortably, in no apparent distress. RT will continue to assist as needed.

## 2014-08-23 ENCOUNTER — Telehealth: Payer: Self-pay | Admitting: *Deleted

## 2014-08-23 DIAGNOSIS — E1065 Type 1 diabetes mellitus with hyperglycemia: Secondary | ICD-10-CM

## 2014-08-23 LAB — GLUCOSE, CAPILLARY
GLUCOSE-CAPILLARY: 581 mg/dL — AB (ref 70–99)
GLUCOSE-CAPILLARY: 321 mg/dL — AB (ref 70–99)
GLUCOSE-CAPILLARY: 201 mg/dL — AB (ref 70–99)
Glucose-Capillary: 471 mg/dL — ABNORMAL HIGH (ref 70–99)
Glucose-Capillary: 361 mg/dL — ABNORMAL HIGH (ref 70–99)
Glucose-Capillary: 274 mg/dL — ABNORMAL HIGH (ref 70–99)
Glucose-Capillary: 257 mg/dL — ABNORMAL HIGH (ref 70–99)
Glucose-Capillary: 246 mg/dL — ABNORMAL HIGH (ref 70–99)

## 2014-08-23 LAB — CBC
HCT: 22.9 % — ABNORMAL LOW (ref 39.0–52.0)
HEMOGLOBIN: 7.5 g/dL — AB (ref 13.0–17.0)
MCH: 26.8 pg (ref 26.0–34.0)
MCHC: 32.8 g/dL (ref 30.0–36.0)
MCV: 81.8 fL (ref 78.0–100.0)
Platelets: 701 10*3/uL — ABNORMAL HIGH (ref 150–400)
RBC: 2.8 MIL/uL — ABNORMAL LOW (ref 4.22–5.81)
RDW: 16.3 % — ABNORMAL HIGH (ref 11.5–15.5)
WBC: 28.7 10*3/uL — ABNORMAL HIGH (ref 4.0–10.5)

## 2014-08-23 LAB — BASIC METABOLIC PANEL
ANION GAP: 15 (ref 5–15)
Anion gap: 19 — ABNORMAL HIGH (ref 5–15)
BUN: 25 mg/dL — AB (ref 6–23)
BUN: 17 mg/dL (ref 6–23)
CALCIUM: 9.5 mg/dL (ref 8.4–10.5)
CALCIUM: 9.5 mg/dL (ref 8.4–10.5)
CO2: 12 mEq/L — ABNORMAL LOW (ref 19–32)
CO2: 15 meq/L — AB (ref 19–32)
CREATININE: 0.71 mg/dL (ref 0.50–1.35)
Chloride: 96 mEq/L (ref 96–112)
Chloride: 101 mEq/L (ref 96–112)
Creatinine, Ser: 0.86 mg/dL (ref 0.50–1.35)
GFR calc Af Amer: >90 mL/min (ref >90)
GFR calc Af Amer: >90 mL/min (ref >90)
GFR calc non Af Amer: >90 mL/min (ref >90)
GLUCOSE: 367 mg/dL — AB (ref 70–99)
Glucose, Bld: 262 mg/dL — ABNORMAL HIGH (ref 70–99)
Potassium: 4.7 mEq/L (ref 3.7–5.3)
Potassium: 4.1 mEq/L (ref 3.7–5.3)
Sodium: 127 mEq/L — ABNORMAL LOW (ref 137–147)
Sodium: 131 mEq/L — ABNORMAL LOW (ref 137–147)

## 2014-08-23 LAB — HEMOGLOBIN A1C
Hgb A1c MFr Bld: 10.1 % — ABNORMAL HIGH (ref <5.7)
MEAN PLASMA GLUCOSE: 243 mg/dL — AB (ref <117)

## 2014-08-23 MED ORDER — INSULIN ASPART 100 UNIT/ML ~~LOC~~ SOLN
16.0000 [IU] | Freq: Once | SUBCUTANEOUS | Status: AC
  Administered 2014-08-23: 16 [IU] via SUBCUTANEOUS

## 2014-08-23 MED ORDER — MORPHINE SULFATE ER 15 MG PO TBCR
15.0000 mg | EXTENDED_RELEASE_TABLET | Freq: Two times a day (BID) | ORAL | Status: DC
  Administered 2014-08-23 – 2014-08-26 (×6): 15 mg via ORAL
  Filled 2014-08-23 (×6): qty 1

## 2014-08-23 MED ORDER — SODIUM CHLORIDE 0.9 % IV SOLN
INTRAVENOUS | Status: DC
  Administered 2014-08-24: 3.6 [IU]/h via INTRAVENOUS
  Filled 2014-08-23: qty 2.5

## 2014-08-23 MED ORDER — INSULIN ASPART 100 UNIT/ML ~~LOC~~ SOLN: 4.0000 [IU] | Freq: Three times a day (TID) | SUBCUTANEOUS | Status: DC

## 2014-08-23 MED ORDER — DEXTROSE-NACL 5-0.45 % IV SOLN: INTRAVENOUS | Status: DC

## 2014-08-23 MED ORDER — INSULIN GLARGINE 100 UNIT/ML ~~LOC~~ SOLN: 26.0000 [IU] | Freq: Every day | SUBCUTANEOUS | Status: DC

## 2014-08-23 MED ORDER — DEXTROSE 50 % IV SOLN: 25.0000 mL | INTRAVENOUS | Status: DC | PRN

## 2014-08-23 MED ORDER — SODIUM CHLORIDE 0.9 % IV SOLN: INTRAVENOUS | Status: DC

## 2014-08-23 MED ORDER — OXYCODONE-ACETAMINOPHEN 5-325 MG PO TABS
1.0000 | ORAL_TABLET | Freq: Three times a day (TID) | ORAL | Status: DC | PRN
Start: 2014-08-23 — End: 2014-08-26
  Administered 2014-08-24 – 2014-08-25 (×2): 1 via ORAL
  Filled 2014-08-23 (×2): qty 1

## 2014-08-23 MED ORDER — INSULIN ASPART 100 UNIT/ML ~~LOC~~ SOLN
10.0000 [IU] | Freq: Once | SUBCUTANEOUS | Status: AC
  Administered 2014-08-23: 10 [IU] via SUBCUTANEOUS

## 2014-08-23 MED ORDER — INSULIN GLARGINE 100 UNIT/ML ~~LOC~~ SOLN: 30.0000 [IU] | Freq: Every day | SUBCUTANEOUS | Status: DC

## 2014-08-23 MED ORDER — INSULIN REGULAR BOLUS VIA INFUSION
0.0000 [IU] | Freq: Three times a day (TID) | INTRAVENOUS | Status: DC
  Administered 2014-08-24: 4.4 [IU] via INTRAVENOUS
  Administered 2014-08-24: 2.5 [IU] via INTRAVENOUS
  Filled 2014-08-23: qty 10

## 2014-08-23 NOTE — Progress Notes (Addendum)
Pt refusing CPT for this morning wants to sleep. Patient resting comfortably, no distress noted. SPO2 100% on 2 LPM. RT will continue to monitor pt.

## 2014-08-23 NOTE — Progress Notes (Signed)
NUTRITION FOLLOW UP  Intervention:   -Continue Glucerna Shake po TID, each supplement provides 220 kcal and 10 grams of protein  -Continue 30 ml Prostat daily  Nutrition Dx:   Inadequate oral intake now related to a decreased appetite as evidenced by meal completion </= 50%; progressing  Goal:   Pt to meet >/= 90% of their estimated nutrition needs; progressing   Monitor:   PO intake, supplement acceptance, labs  Assessment:   Pt with hx of type 1 DM, gastroparesis, spinal cord infarct and paraplegia who was admitted with AMS. Antibiotics started for UTI and PNA. Pt failed BiPAP and was intubated.  Pt had multiple teeth extractions 2 weeks ago.  Pt with 2 wounds present, WOC following.   Pt was sleepy at time of visit.  Per chart review, intake has improved, but variable. Noted 25-100% meal completion, averaging 50%. Observed pt ate 50-75% at lunch meal at bedside. He is taking Glucerna and Prostat supplements well.  WOC continues to follow- continues with bedside debridement and hydrotherapy.  Labs reviewed. Na: 127, CO2: 12, BUN: 25, Glucose: 367, CBGS: 257-321.   Height: Ht Readings from Last 1 Encounters:  08/13/14 5\' 8"  (1.727 m)    Weight Status:   Wt Readings from Last 1 Encounters:  08/13/14 129 lb 6.6 oz (58.7 kg)    Re-estimated needs:  Kcal: 1900-2100 Protein: 107-117 grams Fluid: 1.9-2.1 L  Skin: unstageable pressure ulcer ischial tuberosity, stage II pressure ulcer on lt mid sacrum  Diet Order: Diet Carb Modified   Intake/Output Summary (Last 24 hours) at 08/23/14 1146 Last data filed at 08/23/14 0900  Gross per 24 hour  Intake   1800 ml  Output   1250 ml  Net    550 ml    Last BM: 08/22/14   Labs:   Recent Labs Lab 08/21/14 0614 08/22/14 0733 08/23/14 0513  NA 135* 123* 127*  K 4.8 5.0 4.7  CL 98 90* 96  CO2 26 16* 12*  BUN 8 18 25*  CREATININE 0.67 0.76 0.86  CALCIUM 9.3 9.2 9.5  GLUCOSE 335* 597* 367*    CBG (last 3)    Recent Labs  08/23/14 0624 08/23/14 0654 08/23/14 0740  GLUCAP 321* 274* 257*    Scheduled Meds: . albuterol  2.5 mg Nebulization TID  . atorvastatin  20 mg Oral Daily  . budesonide  0.25 mg Nebulization Q6H  . collagenase   Topical Daily  . feeding supplement (GLUCERNA SHAKE)  237 mL Oral TID BM  . feeding supplement (PRO-STAT SUGAR FREE 64)  30 mL Oral Daily  . heparin subcutaneous  5,000 Units Subcutaneous 3 times per day  . insulin aspart  0-20 Units Subcutaneous TID WC  . insulin glargine  25 Units Subcutaneous Daily  . pantoprazole  40 mg Oral Q1200  . pregabalin  100 mg Oral TID  . sodium chloride  3 mL Intravenous Q12H  . vancomycin  1,250 mg Intravenous Q24H    Continuous Infusions: . sodium chloride 10 mL/hr at 08/21/14 2326  . sodium chloride 100 mL/hr at 08/22/14 1934    Rasheka Denard [name], RD, LDN Pager: [phone] After hours Pager: [phone]

## 2014-08-23 NOTE — Progress Notes (Signed)
Patient called out stating that his blood glucose was high. When RN went to room patient's father stated he checked patient with his own glucose meter and reading was over 500.  RN checked CBG with hospital meter and result was 581. Patient stated that at home it is normal for him to fluctuate a lot with his blood glucose. Patient stated that he did not think he needed more than 16 units of insulin to cover this blood sugar to make sure he did not "bottom out". On-call provider paged to notify of blood sugar and initially ordered 25 units of Novolog. RN informed provider that patient stated he would not take more than 16 units, so order was changed to 16 units. Will continue to monitor.

## 2014-08-23 NOTE — Telephone Encounter (Addendum)
Mother called, Jason [name] is currently staying at [facility] room number [phone], mom is concerned, Dr.'s have taken away his pain meds citing that he is currently over-medicated. She is requesting that Dr. Naaman Plummer stop by while he is doing his rounds

## 2014-08-23 NOTE — Progress Notes (Signed)
Pt and mother refusing chest pt at this time. Pt sick and vomiting.

## 2014-08-23 NOTE — Progress Notes (Signed)
Patient ID: Jason [name], male   DOB: 1986-02-27, 28 y.o.   MRN: [id number]   Wound seen with hydrotherapy.  Beginning to granulate.  Some fibrinous exudate, but otherwise clean.  Continue current therapy.  No surgical therapy needed.  Will sign off.  Imogene Burn. Georgette Dover, MD, [facility] Surgery  General/ Trauma Surgery  08/23/2014 9:39 AM

## 2014-08-23 NOTE — Progress Notes (Signed)
Physical Therapy Wound Treatment Patient Details  Name: Jason [name] MRN: [id number] Date of Birth: July 02, 1986  Today's Date: 08/23/2014 Time: 0891-0026 Time Calculation (min): 32 min  Subjective  Subjective: Minimal verbalization due to pt primarily sleeping  Pain Score: Pain Score: Asleep  Wound Assessment  Pressure Ulcer 08/13/14 Unstageable - Full thickness tissue loss in which the base of the ulcer is covered by slough (yellow, tan, gray, green or brown) and/or eschar (tan, brown or black) in the wound bed. 4cm x 3 cm  necrotic (Active)  Dressing Type Moist to dry;Gauze (Comment);Foam;Barrier Film (skin prep) 08/23/2014 10:31 AM  Dressing Changed 08/23/2014 10:31 AM  Dressing Change Frequency Daily 08/23/2014 10:31 AM  State of Healing Eschar 08/23/2014 10:31 AM  Site / Wound Assessment Pink;Red;Yellow 08/23/2014 10:31 AM  % Wound base Red or Granulating 35% 08/23/2014 10:31 AM  % Wound base Yellow 65% 08/23/2014 10:31 AM  % Wound base Black 0% 08/23/2014 10:31 AM  % Wound base Other (Comment) 0% 08/23/2014 10:31 AM  Peri-wound Assessment Intact 08/23/2014 10:31 AM  Wound Length (cm) 4 cm 08/19/2014 11:00 AM  Wound Width (cm) 3.5 cm 08/19/2014 11:00 AM  Wound Depth (cm) 3 cm 08/19/2014 11:00 AM  Margins Unattached edges (unapproximated) 08/23/2014 10:31 AM  Drainage Amount Minimal 08/23/2014 10:31 AM  Drainage Description Serosanguineous 08/23/2014 10:31 AM  Treatment Debridement (Selective);Hydrotherapy (Pulse lavage);Packing (Saline gauze) 08/23/2014 10:31 AM      Hydrotherapy Pulsed lavage therapy - wound location: R ischium Pulsed Lavage with Suction (psi): 8 psi Pulsed Lavage with Suction - Normal Saline Used: 1000 mL Pulsed Lavage Tip: Tip with splash shield Selective Debridement Selective Debridement - Location: R ischium Selective Debridement - Tools Used: Forceps;Scissors Selective Debridement - Tissue Removed: necrotic tissues/ eschar   Wound Assessment  and Plan  Wound Therapy - Assess/Plan/Recommendations Wound Therapy - Clinical Statement: Pt's wound continues to make steady progress.  Wound Therapy - Functional Problem List: Decreased mobility Factors Delaying/Impairing Wound Healing: Diabetes Mellitus;Immobility;Tobacco use Hydrotherapy Plan: Debridement;Dressing change;Patient/family education;Pulsatile lavage with suction Wound Therapy - Frequency: 6X / week Wound Therapy - Follow Up Recommendations: Wound Care Center;Home health RN Wound Plan: see above  Wound Therapy Goals- Improve the function of patient's integumentary system by progressing the wound(s) through the phases of wound healing (inflammation - proliferation - remodeling) by: Decrease Necrotic Tissue to: 10% Decrease Necrotic Tissue - Progress: Progressing toward goal Increase Granulation Tissue to: 90% Increase Granulation Tissue - Progress: Progressing toward goal  Goals will be updated until maximal potential achieved or discharge criteria met.  Discharge criteria: when goals achieved, discharge from hospital, MD decision/surgical intervention, no progress towards goals, refusal/missing three consecutive treatments without notification or medical reason.  [name] 08/23/2014, 10:37 AM  Suanne Marker PT [phone]

## 2014-08-23 NOTE — Progress Notes (Signed)
PROGRESS NOTE  Jason [name]:[id number] DOB: 1986/05/16 DOA: 08/12/2014 PCP: [name], MD  Assessment/Plan: MSSA bacteremia  - he had a TEE 08/17/14 without vegetations. Continue vancomycin. - Infectious diseases consulted, appreciate input -08/14/2014 surveillance blood cultures negative -suspected source is right hip decubitus ulcer -PICC line--as the patient's surveillance blood cultures have been negative greater than 5 days  Right hip decubitus ulcer -MRI right hip reveals complex fluid collection -Consulted general surgery for opinion--appreciate followup -general surgery noted that no actual pocket of purulent drainage is present after bedside debridement/hydrotherapy. Cont hydrotherapy and bedside debridements to clean this wound up.  Fever -Chest x-ray shows patchy opacities right middle lobe, left base with unchanged small left pleural effusion -08/19/2014--new Foley catheter placed--> Repeat UA and urine culture after new Foley placed -08/15/2014 and 08/19/14--C. Difficile PCR negative -08/20/14--fever 101.41F -08/21/2014--temperature improving after necrotic hip Wound now debrided with hydrotherapy -although patient has pyuria on the most recent UA (08/18/2014), urine culture is negative--will not start additional antibiotics -08/22/2014--with increasing WBC and relative troponins blood pressures signifying a degree of autonomic dysautonomia-->repeat blood cultures  Acute hypoxic respiratory failure - status post intubation, now on nasal cannula - Continue nebulizers with albuterol and Pulmicort -presently stable on 2 L nasal cannula--flutter valve -08/19/2014 chest x-ray shows right middle lobe and left basilar atelectasis with small left effusion  Acute encephalopathy -Multifactorial including the patient's sepsis/infectious process, opioids, hypoxemia -MR brain--new edema signal in the splenium of corpus callosum--> May be related to the  patient's acute respiratory failure -Consulted neurology for opinion--appreciate Dr. [name] may be secondary to his episodes of hypoglycemia and hypoxia and injury related to the same. Doubt infection -Although less likely, we'll check serum B12, ammonia, TSH, HIV--all unremarkable -08/20/2014--patient developed an episode of decreased responsiveness--> CBG 136, ABG showed 7.45/37/90/26 on 2 L,--suspect due to opioids and benzodiazepines--patient subsequently became more alert back to his baseline spontaneously -Discontinue opioids and Ativan -08/21/2014--mentation is back to baseline according to the patient's fiance at the bedside -08/23/14--restart MS contin and percocet prn pain Hypertension  -discontinue antihypertensive medications as the patient's blood pressure is soft -blood pressure remains labile, but stable off of antihypertensive medication  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 - Stable, continue PT  Enterococcal bacteremia - suspect Contaminant  Diabetes mellitus - continue Lantus daily and sliding scale insulin - Continue Lyrica for neuropathy -CBGs increasing to 500s today due to pt eating outside snacks today -Increase NovoLog 7 units with meals -increase lantus to 25 units daily -start IVF -Later in the day 08/22/2014--patient's CBGs dropped into the 50s--> continue Lantus, but will discontinue pre-meal NovoLog -check hemoglobin A1c HLD - continue Lipitor  Chronic pain - d/c narcotics due to encephalopathy   Family Communication: mother updated at bedside Disposition Plan: Home when medically stable         Procedures/Studies: Dg Chest 2 View  08/19/2014   CLINICAL DATA:  Fever, weakness  EXAM: CHEST  2 VIEW  COMPARISON:  08/14/2014  FINDINGS: Cardiomediastinal silhouette is stable. No pulmonary edema. There is streaky right middle lobe and left basilar atelectasis or infiltrate. No pulmonary edema.  Question small left pleural effusion.  IMPRESSION: Streaky right middle lobe and left basilar atelectasis or infiltrate. Question small left pleural effusion. No pulmonary edema.   Electronically Signed   By: Lahoma Crocker M.D.   On: 08/19/2014 12:58   Dg Chest 2 View  07/29/2014  CLINICAL DATA:  Hypotension, history of stroke, left arm weakness  EXAM: CHEST  2 VIEW  COMPARISON:  07/18/2014  FINDINGS: Cardiomediastinal silhouette is stable. No acute infiltrate or pleural effusion. No pulmonary edema. Stable old fracture deformity distal left clavicle.  IMPRESSION: No active cardiopulmonary disease.   Electronically Signed   By: Lahoma Crocker M.D.   On: 07/29/2014 17:54   Ct Head Wo Contrast  07/29/2014   CLINICAL DATA:  Hypertension.  Previous stroke.  EXAM: CT HEAD WITHOUT CONTRAST  TECHNIQUE: Contiguous axial images were obtained from the base of the skull through the vertex without intravenous contrast.  COMPARISON:  04/27/2014.  Brain MR dated 02/04/2014.  FINDINGS: Previously noted Chiari 1 malformation. Otherwise, normal appearing cerebral hemispheres and posterior fossa structures. Normal size and position of the ventricles. No intracranial hemorrhage, mass lesion or CT evidence of acute infarction. Unremarkable bones and included paranasal sinuses.  IMPRESSION: No acute abnormality.  Previously noted Chiari 1 malformation.   Electronically Signed   By: Enrique Sack M.D.   On: 07/29/2014 20:57   Mr Jeri Cos VP Contrast  08/17/2014   CLINICAL DATA:  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 Confusion with fevers and sepsis. Assess for CNS infection.  EXAM: MRI HEAD WITHOUT AND WITH CONTRAST  TECHNIQUE: Multiplanar, multiecho pulse sequences of the brain and surrounding structures were obtained without and with intravenous contrast.  CONTRAST:  59mL MULTIHANCE GADOBENATE DIMEGLUMINE 529 MG/ML IV SOLN  COMPARISON:  Head CT 07/29/2014.  MRI 01/29/2014.  FINDINGS: The brainstem and cerebellum are normal. The  cerebral hemispheres again show a few foci of chronic T2 signal within the white matter, possibly related to old shear injuries. Newly seen is edema in the splenium of the corpus callosum, more on the left than the right. This is associated with low level restricted diffusion. There is no contrast enhancement. No other active pathologic finding. After contrast administration, there is no abnormal enhancement of the brain or leptomeninges, including of the splenium of the corpus callosum  No evidence of mass lesion, hemorrhage, hydrocephalus or extra-axial collection. No pituitary mass. Sinuses are clear.  IMPRESSION: No evidence of intracranial abscess. The patient does have the new finding of edema signal within the splenium of the corpus callosum. The differential diagnosis includes the presenting lesion of multiple sclerosis, demyelination secondary to vitamin B12 deficiency (Marchiafava-Bignami disease), infarction due to hypoxia, and reversible lesions of the splenium associated with various infectious agents including influenza, rotavirus, E coli, and adenovirus. Other transient lesions can be caused by seizure, posterior reversible encephalopathy and hypoglycemia.   Electronically Signed   By: Nelson Chimes M.D.   On: 08/17/2014 19:26   Mr Hip Right W Wo Contrast  08/17/2014   CLINICAL DATA:  Quadriplegic, status post cord infarct, evaluate for osteomyelitis of the right hip from decubitus ulcer  EXAM: MRI PELVIS WITHOUT AND WITH CONTRAST  TECHNIQUE: Multiplanar multisequence MR imaging of the pelvis was performed both before and after administration of intravenous contrast.  CONTRAST:  79mL MULTIHANCE GADOBENATE DIMEGLUMINE 529 MG/ML IV SOLN  COMPARISON:  None.  FINDINGS: There is no focal marrow signal abnormality of bilateral hips. There is no fracture or dislocation. There is no avascular necrosis. Bilateral sacroiliac joints are normal. There is no joint effusion. There is no gross labral or  paralabral abnormality.  There is a decubitus ulcer inferior to the right ischial tuberosity with a small amount air within the ulcer versus packing material. There is a mildly complex fluid collection extending  from the decubitus ulcer to the posterior aspect of the right inferior pubic ramus measuring 1.8 x 1.6 x 3.7 cm. There is no definite cortical destruction. There is mild increased T2 signal within the posterior aspect of the right inferior pubic ramus just medial to the ischial tuberosity, which may be reactive versus early osteomyelitis.  There is diffuse mild increased T2 signal throughout the proximal thigh and pelvic musculature which is likely neurogenic.  There is severe bladder wall thickening with a Foley catheter present. The bladder wall thickening is likely secondary to neurogenic bladder.  IMPRESSION: 1. There is a decubitus ulcer inferior to the right ischial tuberosity with a small amount air within the ulcer versus packing material. There is a mildly complex fluid collection extending from the decubitus ulcer to the posterior aspect of the right inferior pubic ramus measuring 1.8 x 1.6 x 3.7 cm without definite cortical destruction. There is mild increased T2 signal within the posterior aspect of the right inferior pubic ramus just medial to the ischial tuberosity, which may be reactive versus early osteomyelitis.   Electronically Signed   By: Kathreen Devoid   On: 08/17/2014 20:36   Dg Chest Port 1 View  08/14/2014   CLINICAL DATA:  Respiratory failure.  EXAM: PORTABLE CHEST - 1 VIEW  COMPARISON:  08/13/2014  FINDINGS: Endotracheal tube tip between the clavicular heads and carina. Gastric suction tube reaches the stomach.  Airspace disease in the left lower lobe is again noted, still extensive but less dense. Small left pleural effusion. Medial right basilar and right upper lobe (near the minor fissure) airspace disease again seen. No evidence of cavitation or pneumothorax.  IMPRESSION: 1.  Bilateral pneumonia and small left pleural effusion. Aeration in the left lower lobe has improved since yesterday. 2. Endotracheal and orogastric tubes remain in good position.   Electronically Signed   By: Jorje Guild M.D.   On: 08/14/2014 07:47   Dg Chest Port 1 View  08/13/2014   CLINICAL DATA:  Intubation.  EXAM: PORTABLE CHEST - 1 VIEW  COMPARISON:  08/12/2014  FINDINGS: Interval placement of endotracheal tube with tip measuring 4.9 cm above the carinal. Enteric tube was placed. Tip is off the field of view but below the left hemidiaphragm. Heart size and pulmonary vascularity are normal. There is developing small left pleural effusion with consolidation in the left lung base suggesting pneumonia.  IMPRESSION: Appliances appear in satisfactory location. Increasing consolidation and effusion in the left lung base.   Electronically Signed   By: Lucienne Capers M.D.   On: 08/13/2014 06:11   Dg Chest Port 1 View  08/12/2014   CLINICAL DATA:  Fever R50.9 (ICD-10-CM). Altered mental status. Recent dental surgery.  EXAM: PORTABLE CHEST - 1 VIEW  COMPARISON:  07/29/2014  FINDINGS: New subtle densities at the left lung base. Heart size is normal. Trachea is midline. Negative for a pneumothorax.  IMPRESSION: Subtle densities at the left lung base. Findings most likely represent atelectasis.   Electronically Signed   By: Markus Daft M.D.   On: 08/12/2014 19:08         Subjective: Patient denies fevers, chills, headache, chest pain, dyspnea, nausea, vomiting, diarrhea, abdominal pain, dysuria, hematuria   Objective: Filed Vitals:   08/23/14 0500 08/23/14 0732 08/23/14 1437 08/23/14 1520  BP: 107/63  121/75   Pulse: 99 98 96   Temp: 100.1 F (37.8 C)  99.2 F (37.3 C)   TempSrc:   Oral   Resp: 15 14  20   Height:      Weight:      SpO2: 100% 100% 100% 100%    Intake/Output Summary (Last 24 hours) at 08/23/14 1744 Last data filed at 08/23/14 0900  Gross per 24 hour  Intake   1800 ml    Output    400 ml  Net   1400 ml   Weight change:  Exam:   General:  Pt is alert, follows commands appropriately, not in acute distress  HEENT: No icterus, No thrush, No neck mass, [place]/AT  Cardiovascular: RRR, S1/S2, no rubs, no gallops  Respiratory: CTA bilaterally, no wheezing, no crackles, no rhonchi  Abdomen: Soft/+BS, non tender, non distended, no guarding  Extremities: No edema, No lymphangitis, No petechiae, No rashes, no synovitis  Data Reviewed: Basic Metabolic Panel:  Recent Labs Lab 08/18/14 0520 08/19/14 0358 08/21/14 0614 08/22/14 0733 08/23/14 0513  NA 136* 135* 135* 123* 127*  K 3.5* 4.2 4.8 5.0 4.7  CL 101 100 98 90* 96  CO2 22 22 26  16* 12*  GLUCOSE 143* 341* 335* 597* 367*  BUN 9 9 8 18  25*  CREATININE 0.67 0.67 0.67 0.76 0.86  CALCIUM 8.7 8.5 9.3 9.2 9.5   Liver Function Tests:  Recent Labs Lab 08/18/14 0520 08/21/14 0614  AST 8 8  ALT 6 6  ALKPHOS 125* 114  BILITOT 0.3 0.2*  PROT 6.9 7.8  ALBUMIN 2.2* 2.3*   No results for input(s): LIPASE, AMYLASE in the last 168 hours.  Recent Labs Lab 08/18/14 1420  AMMONIA 39   CBC:  Recent Labs Lab 08/19/14 0358 08/20/14 0540 08/21/14 0614 08/22/14 0435 08/23/14 0513  WBC 27.6* 21.1* 21.2* 29.3* 28.7*  NEUTROABS 19.6*  --   --   --   --   HGB 7.5* 7.0* 8.0* 8.0* 7.5*  HCT 22.9* 21.3* 25.0* 25.6* 22.9*  MCV 80.1 79.8 82.0 83.4 81.8  PLT 515* 501* 645* 696* 701*   Cardiac Enzymes: No results for input(s): CKTOTAL, CKMB, CKMBINDEX, TROPONINI in the last 168 hours. BNP: Invalid input(s): POCBNP CBG:  Recent Labs Lab 08/23/14 0624 08/23/14 0654 08/23/14 0740 08/23/14 1142 08/23/14 1651  GLUCAP 321* 274* 257* 201* 246*    Recent Results (from the past 240 hour(s))  Culture, blood (routine x 2)     Status: None   Collection Time: 08/14/14  3:20 PM  Result Value Ref Range Status   Specimen Description BLOOD RIGHT ARM  Final   Special Requests BOTTLES DRAWN AEROBIC ONLY [place]   Final   Culture  Setup Time   Final    08/15/2014 00:57 Performed at [facility]    Culture   Final    NO GROWTH 5 DAYS Performed at [facility]    Report Status 08/21/2014 FINAL  Final  Culture, blood (routine x 2)     Status: None   Collection Time: 08/14/14  3:30 PM  Result Value Ref Range Status   Specimen Description BLOOD RIGHT HAND  Final   Special Requests BOTTLES DRAWN AEROBIC ONLY 4CC  Final   Culture  Setup Time   Final    08/15/2014 00:57 Performed at [place]   Final    NO GROWTH 5 DAYS Performed at [facility]    Report Status 08/21/2014 FINAL  Final  Clostridium Difficile by PCR     Status: None   Collection Time: 08/15/14  2:12 PM  Result Value Ref Range Status  C difficile by pcr NEGATIVE NEGATIVE Final  Clostridium Difficile by PCR     Status: None   Collection Time: 08/19/14  1:14 PM  Result Value Ref Range Status   C difficile by pcr NEGATIVE NEGATIVE Final  Urine culture     Status: None   Collection Time: 08/19/14  4:46 PM  Result Value Ref Range Status   Specimen Description URINE, CATHETERIZED  Final   Special Requests NONE  Final   Culture  Setup Time   Final    08/19/2014 22:41 Performed at [place] Performed at [facility]   Final   Culture NO GROWTH Performed at [facility]   Final   Report Status 08/20/2014 FINAL  Final     Scheduled Meds: . albuterol  2.5 mg Nebulization TID  . atorvastatin  20 mg Oral Daily  . budesonide  0.25 mg Nebulization Q6H  . collagenase   Topical Daily  . feeding supplement (GLUCERNA SHAKE)  237 mL Oral TID BM  . feeding supplement (PRO-STAT SUGAR FREE 64)  30 mL Oral Daily  . heparin subcutaneous  5,000 Units Subcutaneous 3 times per day  . insulin aspart  0-20 Units Subcutaneous TID WC  . [START ON 08/24/2014] insulin aspart  4 Units Subcutaneous TID WC  . [START ON 08/24/2014] insulin glargine   30 Units Subcutaneous Daily  . morphine  15 mg Oral Q12H  . pantoprazole  40 mg Oral Q1200  . pregabalin  100 mg Oral TID  . sodium chloride  3 mL Intravenous Q12H  . vancomycin  1,250 mg Intravenous Q24H   Continuous Infusions: . sodium chloride 10 mL/hr at 08/21/14 2326  . sodium chloride 100 mL/hr at 08/22/14 1934     Suhas Estis, DO  [facility] Pager [phone]  If 7PM-7AM, please contact night-coverage [email or web address] Password [email or web address] 08/23/2014, 5:44 PM   LOS: 11 days

## 2014-08-23 NOTE — Progress Notes (Addendum)
Inpatient Diabetes Program Recommendations  AACE/ADA: New Consensus Statement on Inpatient Glycemic Control (2013)  Target Ranges:  Prepandial:   less than 140 mg/dL      Peak postprandial:   less than 180 mg/dL (1-2 hours)      Critically ill patients:  140 - 180 mg/dL     Results for Jason [name], Jason [name] (MRN [id number]) as of 08/23/2014 09:25  Ref. Range 08/21/2014 07:50 08/21/2014 11:59 08/21/2014 17:07 08/21/2014 21:24  Glucose-Capillary Latest Range: 70-99 mg/dL 340 (H) 265 (H) 157 (H) 241 (H)    Results for Jason [name], Jason [name] (MRN [id number]) as of 08/23/2014 09:25  Ref. Range 08/22/2014 06:38 08/22/2014 08:09 08/22/2014 08:23 08/22/2014 12:02 08/22/2014 16:58 08/22/2014 17:17 08/22/2014 17:57 08/22/2014 21:16  Glucose-Capillary Latest Range: 70-99 mg/dL 555 (HH) 519 (H) 505 (H) 320 (H) 55 (L) 47 (L) 114 (H) 344 (H)    Results for Jason [name], Jason [name] (MRN [id number]) as of 08/23/2014 09:25  Ref. Range 08/23/2014 02:05 08/23/2014 03:25 08/23/2014 05:38 08/23/2014 06:24 08/23/2014 06:54 08/23/2014 07:40  Glucose-Capillary Latest Range: 70-99 mg/dL 581 (HH) 471 (H) 361 (H) 321 (H) 274 (H) 257 (H)     **Patient with extremely labile glucose levels.  Patient has Type 1 DM and needs both basal insulin and insulin for food coverage.  **Was elevated all day on 11/14 and patient had orders for Lantus 15 units daily + Novolog SSI + Novolog 7 units tidwc (meal coverage).  Likely needed more basal insulin this day (per records, patient takes Lantus 20 units QHS at home).  **Patient was again elevated on 11/15.  Received several large doses of Novolog (20 units at 6am, 27 units at 9am, 22 units 12pm).  Only received 5 units total of Lantus yesterday (11/15).  Patient then bottomed out and had Hypoglycemia at 5pm yesterday with CBG of 47 mg/dl.  Hypoglycemia was likely due to the huge doses of Novolog that patient received throughout the day.    **Patient again elevated this AM (CBG 581 mg/dl).   This is likely due to the fact that patient only received a total of 5 units Lantus yesterday.   **Note patient scheduled to get Lantus 25 units today at 10am.   MD- Please consider the following:  1. Decrease SSI to Sensitive scale tid ac + HS 2. Add Novolog Meal Coverage at lower dose- Novolog 5 units tid with meals    Will follow Wyn Quaker RN, MSN, CDE Diabetes Coordinator Inpatient Diabetes Program Team Pager: [phone] (8a-10p)

## 2014-08-24 ENCOUNTER — Ambulatory Visit: Payer: Medicaid Other | Admitting: Family Medicine

## 2014-08-24 LAB — BASIC METABOLIC PANEL
ANION GAP: 12 (ref 5–15)
Anion gap: 11 (ref 5–15)
Anion gap: 12 (ref 5–15)
Anion gap: 13 (ref 5–15)
Anion gap: 16 — ABNORMAL HIGH (ref 5–15)
Anion gap: 16 — ABNORMAL HIGH (ref 5–15)
Anion gap: 17 — ABNORMAL HIGH (ref 5–15)
BUN: 14 mg/dL (ref 6–23)
BUN: 10 mg/dL (ref 6–23)
BUN: 11 mg/dL (ref 6–23)
BUN: 13 mg/dL (ref 6–23)
BUN: 12 mg/dL (ref 6–23)
BUN: 15 mg/dL (ref 6–23)
BUN: 15 mg/dL (ref 6–23)
CALCIUM: 9.4 mg/dL (ref 8.4–10.5)
CALCIUM: 9.5 mg/dL (ref 8.4–10.5)
CALCIUM: 9.6 mg/dL (ref 8.4–10.5)
CHLORIDE: 107 meq/L (ref 96–112)
CHLORIDE: 107 meq/L (ref 96–112)
CHLORIDE: 108 meq/L (ref 96–112)
CHLORIDE: 100 meq/L (ref 96–112)
CO2: 11 mEq/L — ABNORMAL LOW (ref 19–32)
CO2: 16 mEq/L — ABNORMAL LOW (ref 19–32)
CO2: 15 mEq/L — ABNORMAL LOW (ref 19–32)
CO2: 16 mEq/L — ABNORMAL LOW (ref 19–32)
CO2: 16 mEq/L — ABNORMAL LOW (ref 19–32)
CO2: 13 meq/L — AB (ref 19–32)
CO2: 14 meq/L — AB (ref 19–32)
CREATININE: 0.59 mg/dL (ref 0.50–1.35)
CREATININE: 0.59 mg/dL (ref 0.50–1.35)
CREATININE: 0.6 mg/dL (ref 0.50–1.35)
CREATININE: 0.64 mg/dL (ref 0.50–1.35)
CREATININE: 0.66 mg/dL (ref 0.50–1.35)
CREATININE: 0.69 mg/dL (ref 0.50–1.35)
CREATININE: 0.71 mg/dL (ref 0.50–1.35)
Calcium: 9.6 mg/dL (ref 8.4–10.5)
Calcium: 9.8 mg/dL (ref 8.4–10.5)
Calcium: 9.6 mg/dL (ref 8.4–10.5)
Calcium: 9.5 mg/dL (ref 8.4–10.5)
Chloride: 99 mEq/L (ref 96–112)
Chloride: 98 mEq/L (ref 96–112)
Chloride: 105 mEq/L (ref 96–112)
GFR calc Af Amer: >90 mL/min (ref >90)
GFR calc Af Amer: >90 mL/min (ref >90)
GFR calc Af Amer: >90 mL/min (ref >90)
GFR calc Af Amer: >90 mL/min (ref >90)
GFR calc Af Amer: >90 mL/min (ref >90)
GFR calc non Af Amer: >90 mL/min (ref >90)
GFR calc non Af Amer: >90 mL/min (ref >90)
GFR calc non Af Amer: >90 mL/min (ref >90)
GFR calc non Af Amer: >90 mL/min (ref >90)
GFR calc non Af Amer: >90 mL/min (ref >90)
GFR calc non Af Amer: >90 mL/min (ref >90)
GLUCOSE: 367 mg/dL — AB (ref 70–99)
GLUCOSE: 400 mg/dL — AB (ref 70–99)
GLUCOSE: 114 mg/dL — AB (ref 70–99)
GLUCOSE: 124 mg/dL — AB (ref 70–99)
GLUCOSE: 158 mg/dL — AB (ref 70–99)
Glucose, Bld: 404 mg/dL — ABNORMAL HIGH (ref 70–99)
Glucose, Bld: 197 mg/dL — ABNORMAL HIGH (ref 70–99)
POTASSIUM: 4 meq/L (ref 3.7–5.3)
POTASSIUM: 3.8 meq/L (ref 3.7–5.3)
POTASSIUM: 4 meq/L (ref 3.7–5.3)
POTASSIUM: 4.1 meq/L (ref 3.7–5.3)
Potassium: 4.5 mEq/L (ref 3.7–5.3)
Potassium: 4.7 mEq/L (ref 3.7–5.3)
Potassium: 4.7 mEq/L (ref 3.7–5.3)
Sodium: 126 mEq/L — ABNORMAL LOW (ref 137–147)
Sodium: 129 mEq/L — ABNORMAL LOW (ref 137–147)
Sodium: 129 mEq/L — ABNORMAL LOW (ref 137–147)
Sodium: 133 mEq/L — ABNORMAL LOW (ref 137–147)
Sodium: 135 mEq/L — ABNORMAL LOW (ref 137–147)
Sodium: 135 mEq/L — ABNORMAL LOW (ref 137–147)
Sodium: 135 mEq/L — ABNORMAL LOW (ref 137–147)

## 2014-08-24 LAB — CBC
HEMATOCRIT: 22.5 % — AB (ref 39.0–52.0)
Hemoglobin: 7 g/dL — ABNORMAL LOW (ref 13.0–17.0)
MCH: 25.1 pg — AB (ref 26.0–34.0)
MCHC: 31.1 g/dL (ref 30.0–36.0)
MCV: 80.6 fL (ref 78.0–100.0)
Platelets: 694 10*3/uL — ABNORMAL HIGH (ref 150–400)
RBC: 2.79 MIL/uL — AB (ref 4.22–5.81)
RDW: 16.1 % — ABNORMAL HIGH (ref 11.5–15.5)
WBC: 23.5 10*3/uL — ABNORMAL HIGH (ref 4.0–10.5)

## 2014-08-24 LAB — GLUCOSE, CAPILLARY
GLUCOSE-CAPILLARY: 348 mg/dL — AB (ref 70–99)
GLUCOSE-CAPILLARY: 135 mg/dL — AB (ref 70–99)
GLUCOSE-CAPILLARY: 118 mg/dL — AB (ref 70–99)
GLUCOSE-CAPILLARY: 142 mg/dL — AB (ref 70–99)
GLUCOSE-CAPILLARY: 165 mg/dL — AB (ref 70–99)
GLUCOSE-CAPILLARY: 232 mg/dL — AB (ref 70–99)
Glucose-Capillary: 417 mg/dL — ABNORMAL HIGH (ref 70–99)
Glucose-Capillary: 433 mg/dL — ABNORMAL HIGH (ref 70–99)
Glucose-Capillary: 305 mg/dL — ABNORMAL HIGH (ref 70–99)
Glucose-Capillary: 241 mg/dL — ABNORMAL HIGH (ref 70–99)
Glucose-Capillary: 193 mg/dL — ABNORMAL HIGH (ref 70–99)
Glucose-Capillary: 143 mg/dL — ABNORMAL HIGH (ref 70–99)
Glucose-Capillary: 126 mg/dL — ABNORMAL HIGH (ref 70–99)
Glucose-Capillary: 177 mg/dL — ABNORMAL HIGH (ref 70–99)
Glucose-Capillary: 192 mg/dL — ABNORMAL HIGH (ref 70–99)

## 2014-08-24 MED ORDER — SODIUM CHLORIDE 0.9 % IV BOLUS (SEPSIS)
500.0000 mL | Freq: Once | INTRAVENOUS | Status: AC
  Administered 2014-08-24: 500 mL via INTRAVENOUS

## 2014-08-24 MED ORDER — INSULIN ASPART 100 UNIT/ML ~~LOC~~ SOLN
4.0000 [IU] | Freq: Three times a day (TID) | SUBCUTANEOUS | Status: DC
  Administered 2014-08-24 – 2014-08-26 (×5): 4 [IU] via SUBCUTANEOUS

## 2014-08-24 MED ORDER — DEXTROSE-NACL 5-0.9 % IV SOLN
INTRAVENOUS | Status: DC
  Administered 2014-08-24 (×3): via INTRAVENOUS

## 2014-08-24 MED ORDER — INSULIN GLARGINE 100 UNIT/ML ~~LOC~~ SOLN
26.0000 [IU] | Freq: Every day | SUBCUTANEOUS | Status: DC
  Administered 2014-08-24 – 2014-08-26 (×3): 26 [IU] via SUBCUTANEOUS
  Filled 2014-08-24 (×3): qty 0.26

## 2014-08-24 MED ORDER — INSULIN ASPART 100 UNIT/ML ~~LOC~~ SOLN
0.0000 [IU] | Freq: Three times a day (TID) | SUBCUTANEOUS | Status: DC
  Administered 2014-08-24: 3 [IU] via SUBCUTANEOUS
  Administered 2014-08-25: 2 [IU] via SUBCUTANEOUS
  Administered 2014-08-26: 3 [IU] via SUBCUTANEOUS

## 2014-08-24 MED ORDER — VANCOMYCIN HCL 10 G IV SOLR: 1250.0000 mg | INTRAVENOUS | Status: DC

## 2014-08-24 MED ORDER — INSULIN GLARGINE 100 UNIT/ML ~~LOC~~ SOLN: 26.0000 [IU] | Freq: Every day | SUBCUTANEOUS | Status: DC

## 2014-08-24 MED ORDER — SODIUM CHLORIDE 0.9 % IV SOLN
INTRAVENOUS | Status: DC
  Administered 2014-08-24: 03:00:00 via INTRAVENOUS

## 2014-08-24 MED ORDER — GLUCERNA SHAKE PO LIQD: 237.0000 mL | Freq: Three times a day (TID) | ORAL | Status: DC

## 2014-08-24 MED ORDER — SODIUM CHLORIDE 0.9 % IJ SOLN
10.0000 mL | INTRAMUSCULAR | Status: DC | PRN
  Administered 2014-08-26: 10 mL
  Filled 2014-08-24: qty 40

## 2014-08-24 MED ORDER — INSULIN ASPART 100 UNIT/ML ~~LOC~~ SOLN
0.0000 [IU] | Freq: Every day | SUBCUTANEOUS | Status: DC
  Administered 2014-08-24: 2 [IU] via SUBCUTANEOUS

## 2014-08-24 MED ORDER — PRO-STAT SUGAR FREE PO LIQD: 30.0000 mL | Freq: Every day | ORAL | Status: DC

## 2014-08-24 NOTE — Progress Notes (Signed)
Dr. Carles Collet made aware of patient's lab results and MD stated orders will be placed. Order to stop glucostabilizer one hour after Lantus dose given.

## 2014-08-24 NOTE — Progress Notes (Signed)
Physical Therapy Wound Treatment Patient Details  Name: Jason [name] MRN: [id number] Date of Birth: 09-12-1986  Today's Date: 08/24/2014 Time: 1035-1100 Time Calculation (min): 25 min  Subjective  Subjective: yeh, last night was pretty bad.  Pain Score: Pain Score: 0-No pain  Wound Assessment  Pressure Ulcer 08/13/14 Unstageable - Full thickness tissue loss in which the base of the ulcer is covered by slough (yellow, tan, gray, green or brown) and/or eschar (tan, brown or black) in the wound bed. 4cm x 3 cm  necrotic (Active)  Dressing Type Moist to dry;Gauze (Comment);Barrier Film (skin prep);Peripads;Opsite Post-Op Visible 08/24/2014 11:32 AM  Dressing Changed 08/24/2014 11:32 AM  Dressing Change Frequency Daily 08/24/2014 11:32 AM  State of Healing Eschar 08/24/2014 11:32 AM  Site / Wound Assessment Pink;Red;Yellow 08/24/2014 11:32 AM  % Wound base Red or Granulating 70% 08/24/2014 11:32 AM  % Wound base Yellow 30% 08/24/2014 11:32 AM  % Wound base Black 0% 08/24/2014 11:32 AM  % Wound base Other (Comment) 0% 08/24/2014 11:32 AM  Peri-wound Assessment Intact 08/24/2014 11:32 AM  Wound Length (cm) 4 cm 08/19/2014 11:00 AM  Wound Width (cm) 3.5 cm 08/19/2014 11:00 AM  Wound Depth (cm) 3 cm 08/19/2014 11:00 AM  Margins Unattached edges (unapproximated) 08/24/2014 11:32 AM  Drainage Amount Minimal 08/24/2014 11:32 AM  Drainage Description Serosanguineous 08/24/2014 11:32 AM  Treatment Cleansed 08/24/2014 10:48 AM     Pressure Ulcer 08/13/14 Stage II -  Partial thickness loss of dermis presenting as a shallow open ulcer with a red, pink wound bed without slough. 2cm x 3 cm necrotic (Active)  Dressing Type None 08/24/2014 10:48 AM  Dressing Clean;Dry;Intact 08/21/2014  9:35 AM  Dressing Change Frequency PRN 08/21/2014  9:35 AM  State of Healing Non-healing 08/17/2014  7:50 AM  Site / Wound Assessment Clean;Dry;Pink 08/24/2014 10:48 AM  % Wound base Red or Granulating 100%  08/21/2014  9:35 AM  % Wound base Black 20% 08/13/2014  8:00 AM  Peri-wound Assessment Intact;Pink 08/24/2014 10:48 AM  Margins Unattached edges (unapproximated) 08/21/2014  9:35 AM  Drainage Amount Scant 08/24/2014 10:48 AM  Drainage Description Serous 08/24/2014 10:48 AM  Treatment Cleansed 08/24/2014 10:48 AM     Wound / Incision (Open or Dehisced) 08/20/14 Other (Comment) Finger (Comment which one) Left middle, eschar looking wound (Active)  Dressing Type None 08/24/2014 10:48 AM  Site / Wound Assessment Dry 08/24/2014 10:48 AM  % Wound base Black 100% 08/20/2014 11:03 PM  Peri-wound Assessment Intact 08/24/2014 10:48 AM  Margins Unattached edges (unapproximated) 08/20/2014 11:03 PM  Drainage Amount None 08/24/2014 10:48 AM   Hydrotherapy Pulsed lavage therapy - wound location: R [place] Pulsed Lavage with Suction (psi): 8 psi Pulsed Lavage with Suction - Normal Saline Used: 1000 mL Pulsed Lavage Tip: Tip with splash shield Selective Debridement Selective Debridement - Location: R [place]ch[place] Selective Debridement - Tools Used: Forceps;Scissors Selective Debridement - Tissue Removed: necrotic tissues/ eschar   Wound Assessment and Plan  Wound Therapy - Assess/Plan/Recommendations Wound Therapy - Clinical Statement: Pt's wound continues to make steady progress.  Wound Therapy - Functional Problem List: Decreased mobility Factors Delaying/Impairing Wound Healing: Diabetes Mellitus;Immobility;Tobacco use Hydrotherapy Plan: Debridement;Dressing change;Patient/family education;Pulsatile lavage with suction Wound Therapy - Frequency: 6X / week Wound Therapy - Follow Up Recommendations: Wound Care Center;Home health RN Wound Plan: see above  Wound Therapy Goals- Improve the function of patient's integumentary system by progressing the wound(s) through the phases of wound healing (inflammation - proliferation - remodeling) by: Decrease Necrotic  Tissue to: 10% Decrease Necrotic Tissue -  Progress: Progressing toward goal Increase Granulation Tissue to: 90% Increase Granulation Tissue - Progress: Progressing toward goal  Goals will be updated until maximal potential achieved or discharge criteria met.  Discharge criteria: when goals achieved, discharge from hospital, MD decision/surgical intervention, no progress towards goals, refusal/missing three consecutive treatments without notification or medical reason.  GP     Shaquilla Kehres, [name] 08/24/2014, 11:37 AM

## 2014-08-24 NOTE — Progress Notes (Signed)
Peripherally Inserted Central Catheter/Midline Placement  The IV Nurse has discussed with the patient and/or persons authorized to consent for the patient, the purpose of this procedure and the potential benefits and risks involved with this procedure.  The benefits include less needle sticks, lab draws from the catheter and patient may be discharged home with the catheter.  Risks include, but not limited to, infection, bleeding, blood clot (thrombus formation), and puncture of an artery; nerve damage and irregular heat beat.  Alternatives to this procedure were also discussed.  PICC/Midline Placement Documentation  PICC / Midline Single Lumen 49/20/10 PICC Left Basilic 46 cm 1 cm (Active)  Indication for Insertion or Continuance of Line Home intravenous therapies (PICC only) 08/24/2014  6:00 PM  Exposed Catheter (cm) 1 cm 08/24/2014  6:00 PM  Dressing Change Due 08/31/14 08/24/2014  6:00 PM       Jule Economy Horton 08/24/2014, 6:02 PM

## 2014-08-24 NOTE — Progress Notes (Signed)
[facility]  Patient Status:   NEW PT FOR [facility] THIS ADMISSION  [facility] is providing the following services: [facility] PARTNERSHIP WITH [name].   If patient discharges after hours, please call [phone].   Larry Sierras 08/24/2014, 2:17 PM

## 2014-08-24 NOTE — Discharge Summary (Signed)
Physician Discharge Summary  Jason [name] [place] NUU:[id number] DOB: Jul 26, 1986 DOA: 08/12/2014  PCP: [name], MD  Admit date: 08/12/2014 Discharge date: 08/25/14 Recommendations for Outpatient Follow-up:  1. Pt will need to follow up with PCP in 2 weeks post discharge 2. Please obtain BMP, CBC, vancomycin trough every 7 days--send results to Dr. [name] 3. Patient will continue on vancomycin IV with the last dose on 09/24/2014 Discharge Diagnoses:  MSSA bacteremia  - he had a TEE 08/17/14 without vegetations. Continue vancomycin. - Infectious diseases consulted, appreciate input -08/14/2014 surveillance blood cultures negative -suspected source is right hip decubitus ulcer -PICC line--as the patient's surveillance blood cultures have been negative greater than 5 days -the patient will continue on antibiotics until 09/24/2014 which will mark 6 weeks. 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 Cont hydrotherapy and bedside debridements to clean this wound up. -presently, there is good granulation tissue -patient will go home with home health RN for wound care Fever -resolved -Chest x-ray shows patchy opacities right middle lobe, left base with unchanged small left pleural effusion -08/19/2014--new Foley catheter placed--> Repeat UA and urine culture after new Foley placed -08/15/2014 and 08/19/14--C. Difficile PCR negative -08/20/14--fever 101.47F -08/21/2014--temperature improving after necrotic hip Wound now debrided with hydrotherapy -although patient has pyuria on the most recent UA (08/18/2014), urine culture is negative--will not start additional  antibiotics -08/22/2014--with increasing WBC and relative troponins blood pressures signifying a degree of autonomic dysautonomia-->repeat blood cultures  Acute hypoxic respiratory failure - status post intubation, now on nasal cannula - Continue nebulizers with albuterol and Pulmicort -presently stable on 2 L nasal cannula--flutter valve -08/19/2014 chest x-ray shows right middle lobe and left basilar atelectasis with small left effusion  Acute encephalopathy -Multifactorial including the patient's sepsis/infectious process, opioids, hypoxemia -MR brain--new edema signal in the splenium of corpus callosum--> May be related to the patient's acute respiratory failure -Consulted neurology for opinion--appreciate Dr. [name] may be secondary to his episodes of hypoglycemia and hypoxia and injury related to the same. Doubt infection--repeat MRI in 2-3 month -Although less likely, we'll check serum B12, ammonia, TSH, HIV--all unremarkable -08/20/2014--patient developed an episode of decreased responsiveness--> CBG 136, ABG showed 7.45/37/90/26 on 2 L,--suspect due to opioids and benzodiazepines--patient subsequently became more alert back to his baseline spontaneously -Discontinue opioids and Ativan -08/21/2014--mentation is back to baseline according to the patient's fiance at the bedside -08/23/14--restart MS contin and percocet prn pain--patient's mental status remained at baseline--he follows Dr. 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 [name] for his pain management issues   Family Communication: mother updated at bedside Disposition Plan: Home when medically stable  Discharge Condition: stable  Disposition: home     Follow-up Information    Follow up with [facility].   Why:  Home Health RN   Contact information:   [name] [address] [phone]       Follow up with [name], MD In 3 weeks.   Specialty:  Infectious Diseases   Contact information:   [address] [place] [address] [phone]       Diet:carbohydrate modified Wt Readings from Last 3 Encounters:  08/13/14 58.7 kg (129 lb 6.6 oz)  08/03/14 63.05 kg (139 lb)  07/30/14 63.078 kg (139 lb 1 oz)    History of present illness:  Jason [name] is a 28 y.o. male, with history of functional quadriplegia status post spinal infarct in the past who was brought by EMS squad to [facility] because of altered mental status and fever. Patient was started on vancomycin and meropenem in addition to IV fluids and transferred to our hospital. Patient is confused and cannot give anysignificant history.patient was on BiPAP and initially refused intubation for airway protection.patient was found to be hypothermic and he was started on bear hugger Called patient. Advised patient of provider's approval for requested procedure, as well as any comments/instructions from provider. Patient had 7 of is the removed 2 weeks ago.patient's  respiratory rate was 7 and he received Narcan in the emergency room 2 prior To transfer  28 yo M, hx spinal cord infarct and paraplegia presented with AMS, fever to [facility] and transferred to [facility] service. Thought to have PNA based on x-ray as well as a UTI. Antibiotics were started and placed in SDU. Found to be acidotic and BiPAP was started. Had worsening resp acidosis requiring intubation.   STUDIES/SIGNIFICANT EVENTS: 11/06 Admitted by [facility] to SDU. Failed trial of BiPAP. Transferred to ICU and intubated. PCCM assumed primary duties 11/06 [place] consult for unstageable sacral pressure ulcer 11/07 MSSA and enteroccus bacteremia noted, ID consultation 11/7- echo 55% , trivial MR, TR, no veg  Consultants: General surgery Neurology ID  Discharge Exam: Filed Vitals:   08/24/14 1307  BP: 100/63  Pulse: 88  Temp: 97.8 F (36.6 C)  Resp: 18   Filed Vitals:   08/24/14 0304 08/24/14 0505 08/24/14 1307 08/24/14 1418  BP:  105/62 100/63   Pulse: 103 89 88   Temp:  99.1 F (37.3 C) 97.8 F (36.6 C)   TempSrc:  Oral Oral   Resp: 16 18 18    Height:      Weight:      SpO2: 100% 100% 100% 100%   General: A&O x 3, NAD, pleasant, cooperative Cardiovascular: RRR, no rub, no gallop, no S3 Respiratory: CTAB, no wheeze, no rhonchi Abdomen:soft, nontender, nondistended, positive bowel sounds Extremities: No edema, No lymphangitis, no petechiae  Discharge Instructions     Medication List    STOP taking these medications        clindamycin 300 MG capsule  Commonly known as:  CLEOCIN     fluconazole 200 MG tablet  Commonly known as:  DIFLUCAN      TAKE these medications        albuterol (2.5 MG/3ML) 0.083% nebulizer solution  Commonly known as:  PROVENTIL  Take 3 mLs (2.5 mg total) by nebulization every 4 (four) hours as needed for wheezing or shortness of breath.     amLODipine 2.5 MG tablet  Commonly known as:  NORVASC  Take 1 tablet (2.5 mg total) by  mouth daily.     atorvastatin 20 MG tablet  Commonly known as:  LIPITOR  Take 20 mg by mouth daily.     baclofen 10 MG tablet  Commonly known as:  LIORESAL  Take 10-20 mg by mouth 3 (three) times daily. Takes 10mg  in morning and lunchtime and takes 20mg  at bedtime     DEXILANT 60 MG capsule  Generic drug:  dexlansoprazole  Take 60 mg by mouth daily.     diclofenac sodium 1 % Gel  Commonly known as:  VOLTAREN  Apply 2 g topically daily as needed (for pain).     diphenoxylate-atropine 2.5-0.025 MG per tablet  Commonly known as:  LOMOTIL  Take 1 tablet by mouth 2 (two) times daily.     feeding supplement (GLUCERNA SHAKE) Liqd  Take 237 mLs by mouth 3 (three) times daily between meals.     feeding supplement (PRO-STAT SUGAR FREE 64) Liqd  Take 30 mLs by mouth daily.     furosemide 40 MG tablet  Commonly known as:  LASIX  Take 0.5 tablets (20 mg total) by mouth daily as needed for fluid.     glucagon 1 MG injection  Commonly known as:  GLUCAGON EMERGENCY  Inject 1 mg into the vein once as needed.     glucose blood test strip  Commonly known as:  ACCU-CHEK AVIVA PLUS  Use to check blood sugar 4 times per day     insulin aspart 100 UNIT/ML injection  Commonly known as:  novoLOG  Inject 5 Units into the skin 3 (three) times daily with meals.     insulin glargine 100 UNIT/ML injection  Commonly known as:  LANTUS  Inject 0.26 mLs (26 Units total) into the skin daily.     lidocaine 2 % jelly  Commonly known as:  XYLOCAINE  Apply 1 application topically 4 (four) times daily as needed (pain).     loratadine 10 MG tablet  Commonly known as:  CLARITIN  Take 10 mg by mouth 2 (two) times daily as needed for allergies.     metaxalone 800 MG tablet  Commonly known as:  SKELAXIN  Take 800 mg by mouth 3 (three) times daily as needed for muscle spasms.     methocarbamol 750 MG tablet  Commonly known as:  ROBAXIN  Take 750 mg by mouth 4 (four) times daily as needed.      metoCLOPramide 5 MG tablet  Commonly known as:  REGLAN  Take 1 tablet (5 mg total) by mouth 2 (two) times a week. And as needed for nausea     morphine 30 MG 12 hr tablet  Commonly known as:  MS CONTIN  Take 1 tablet (30 mg total) by mouth every 12 (twelve) hours.     ondansetron 8 MG tablet  Commonly known as:  ZOFRAN  Take 8 mg by mouth 2 (two) times daily as needed for nausea or vomiting.     OxyCODONE 15 mg T12a 12 hr tablet  Commonly known as:  OXYCONTIN  Take 15 mg by mouth every 12 (twelve) hours.     oxyCODONE-acetaminophen 10-325 MG per tablet  Commonly known as:  PERCOCET  Take 1 tablet by mouth every 8 (eight) hours as needed  for pain.     pregabalin 100 MG capsule  Commonly known as:  LYRICA  Take 100-200 mg by mouth 4 (four) times daily. Take 100 mg by mouth in the morning, take 100 mg by mouth in the afternoon, take 100 mg by mouth in the evening and take 200 mg by mouth at bedtime.     silver sulfADIAZINE 1 % cream  Commonly known as:  SILVADENE  Apply 1 application topically 2 (two) times a week. Tuesday and thursday     vancomycin 1,250 mg in sodium chloride 0.9 % 250 mL  Inject 1,250 mg into the vein daily. Last dose to be given on 09/24/14         The results of significant diagnostics from this hospitalization (including imaging, microbiology, ancillary and laboratory) are listed below for reference.    Significant Diagnostic Studies: Dg Chest 2 View  08/19/2014   CLINICAL DATA:  Fever, weakness  EXAM: CHEST  2 VIEW  COMPARISON:  08/14/2014  FINDINGS: Cardiomediastinal silhouette is stable. No pulmonary edema. There is streaky right middle lobe and left basilar atelectasis or infiltrate. No pulmonary edema. Question small left pleural effusion.  IMPRESSION: Streaky right middle lobe and left basilar atelectasis or infiltrate. Question small left pleural effusion. No pulmonary edema.   Electronically Signed   By: Lahoma Crocker M.D.   On: 08/19/2014 12:58   Dg  Chest 2 View  07/29/2014   CLINICAL DATA:  Hypotension, history of stroke, left arm weakness  EXAM: CHEST  2 VIEW  COMPARISON:  07/18/2014  FINDINGS: Cardiomediastinal silhouette is stable. No acute infiltrate or pleural effusion. No pulmonary edema. Stable old fracture deformity distal left clavicle.  IMPRESSION: No active cardiopulmonary disease.   Electronically Signed   By: Lahoma Crocker M.D.   On: 07/29/2014 17:54   Ct Head Wo Contrast  07/29/2014   CLINICAL DATA:  Hypertension.  Previous stroke.  EXAM: CT HEAD WITHOUT CONTRAST  TECHNIQUE: Contiguous axial images were obtained from the base of the skull through the vertex without intravenous contrast.  COMPARISON:  04/27/2014.  Brain MR dated 02/04/2014.  FINDINGS: Previously noted Chiari 1 malformation. Otherwise, normal appearing cerebral hemispheres and posterior fossa structures. Normal size and position of the ventricles. No intracranial hemorrhage, mass lesion or CT evidence of acute infarction. Unremarkable bones and included paranasal sinuses.  IMPRESSION: No acute abnormality.  Previously noted Chiari 1 malformation.   Electronically Signed   By: Enrique Sack M.D.   On: 07/29/2014 20:57   Mr Jeri Cos JY Contrast  08/17/2014   CLINICAL DATA:  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 Confusion with fevers and sepsis. Assess for CNS infection.  EXAM: MRI HEAD WITHOUT AND WITH CONTRAST  TECHNIQUE: Multiplanar, multiecho pulse sequences of the brain and surrounding structures were obtained without and with intravenous contrast.  CONTRAST:  2mL MULTIHANCE GADOBENATE DIMEGLUMINE 529 MG/ML IV SOLN  COMPARISON:  Head CT 07/29/2014.  MRI 01/29/2014.  FINDINGS: The brainstem and cerebellum are normal. The cerebral hemispheres again show a few foci of chronic T2 signal within the white matter, possibly related to old shear injuries. Newly seen is edema in the splenium of the corpus callosum, more on the left than the right. This is associated with low  level restricted diffusion. There is no contrast enhancement. No other active pathologic finding. After contrast administration, there is no abnormal enhancement of the brain or leptomeninges, including of the splenium of the corpus callosum  No evidence of mass lesion, hemorrhage, hydrocephalus  or extra-axial collection. No pituitary mass. Sinuses are clear.  IMPRESSION: No evidence of intracranial abscess. The patient does have the new finding of edema signal within the splenium of the corpus callosum. The differential diagnosis includes the presenting lesion of multiple sclerosis, demyelination secondary to vitamin B12 deficiency (Marchiafava-Bignami disease), infarction due to hypoxia, and reversible lesions of the splenium associated with various infectious agents including influenza, rotavirus, E coli, and adenovirus. Other transient lesions can be caused by seizure, posterior reversible encephalopathy and hypoglycemia.   Electronically Signed   By: Nelson Chimes M.D.   On: 08/17/2014 19:26   Mr Hip Right W Wo Contrast  08/17/2014   CLINICAL DATA:  Quadriplegic, status post cord infarct, evaluate for osteomyelitis of the right hip from decubitus ulcer  EXAM: MRI PELVIS WITHOUT AND WITH CONTRAST  TECHNIQUE: Multiplanar multisequence MR imaging of the pelvis was performed both before and after administration of intravenous contrast.  CONTRAST:  48mL MULTIHANCE GADOBENATE DIMEGLUMINE 529 MG/ML IV SOLN  COMPARISON:  None.  FINDINGS: There is no focal marrow signal abnormality of bilateral hips. There is no fracture or dislocation. There is no avascular necrosis. Bilateral sacroiliac joints are normal. There is no joint effusion. There is no gross labral or paralabral abnormality.  There is a decubitus ulcer inferior to the right ischial tuberosity with a small amount air within the ulcer versus packing material. There is a mildly complex fluid collection extending from the decubitus ulcer to the posterior  aspect of the right inferior pubic ramus measuring 1.8 x 1.6 x 3.7 cm. There is no definite cortical destruction. There is mild increased T2 signal within the posterior aspect of the right inferior pubic ramus just medial to the ischial tuberosity, which may be reactive versus early osteomyelitis.  There is diffuse mild increased T2 signal throughout the proximal thigh and pelvic musculature which is likely neurogenic.  There is severe bladder wall thickening with a Foley catheter present. The bladder wall thickening is likely secondary to neurogenic bladder.  IMPRESSION: 1. There is a decubitus ulcer inferior to the right ischial tuberosity with a small amount air within the ulcer versus packing material. There is a mildly complex fluid collection extending from the decubitus ulcer to the posterior aspect of the right inferior pubic ramus measuring 1.8 x 1.6 x 3.7 cm without definite cortical destruction. There is mild increased T2 signal within the posterior aspect of the right inferior pubic ramus just medial to the ischial tuberosity, which may be reactive versus early osteomyelitis.   Electronically Signed   By: Kathreen Devoid   On: 08/17/2014 20:36   Dg Chest Port 1 View  08/14/2014   CLINICAL DATA:  Respiratory failure.  EXAM: PORTABLE CHEST - 1 VIEW  COMPARISON:  08/13/2014  FINDINGS: Endotracheal tube tip between the clavicular heads and carina. Gastric suction tube reaches the stomach.  Airspace disease in the left lower lobe is again noted, still extensive but less dense. Small left pleural effusion. Medial right basilar and right upper lobe (near the minor fissure) airspace disease again seen. No evidence of cavitation or pneumothorax.  IMPRESSION: 1. Bilateral pneumonia and small left pleural effusion. Aeration in the left lower lobe has improved since yesterday. 2. Endotracheal and orogastric tubes remain in good position.   Electronically Signed   By: Jorje Guild M.D.   On: 08/14/2014 07:47    Dg Chest Port 1 View  08/13/2014   CLINICAL DATA:  Intubation.  EXAM: PORTABLE CHEST - 1 VIEW  COMPARISON:  08/12/2014  FINDINGS: Interval placement of endotracheal tube with tip measuring 4.9 cm above the carinal. Enteric tube was placed. Tip is off the field of view but below the left hemidiaphragm. Heart size and pulmonary vascularity are normal. There is developing small left pleural effusion with consolidation in the left lung base suggesting pneumonia.  IMPRESSION: Appliances appear in satisfactory location. Increasing consolidation and effusion in the left lung base.   Electronically Signed   By: Lucienne Capers M.D.   On: 08/13/2014 06:11   Dg Chest Port 1 View  08/12/2014   CLINICAL DATA:  Fever R50.9 (ICD-10-CM). Altered mental status. Recent dental surgery.  EXAM: PORTABLE CHEST - 1 VIEW  COMPARISON:  07/29/2014  FINDINGS: New subtle densities at the left lung base. Heart size is normal. Trachea is midline. Negative for a pneumothorax.  IMPRESSION: Subtle densities at the left lung base. Findings most likely represent atelectasis.   Electronically Signed   By: Markus Daft M.D.   On: 08/12/2014 19:08     Microbiology: Recent Results (from the past 240 hour(s))  Clostridium Difficile by PCR     Status: None   Collection Time: 08/15/14  2:12 PM  Result Value Ref Range Status   C difficile by pcr NEGATIVE NEGATIVE Final  Clostridium Difficile by PCR     Status: None   Collection Time: 08/19/14  1:14 PM  Result Value Ref Range Status   C difficile by pcr NEGATIVE NEGATIVE Final  Urine culture     Status: None   Collection Time: 08/19/14  4:46 PM  Result Value Ref Range Status   Specimen Description URINE, CATHETERIZED  Final   Special Requests NONE  Final   Culture  Setup Time   Final    08/19/2014 22:41 Performed at [place] Performed at [facility]   Final   Culture NO GROWTH Performed at [facility]   Final   Report  Status 08/20/2014 FINAL  Final  Culture, blood (routine x 2)     Status: None (Preliminary result)   Collection Time: 08/22/14  7:40 PM  Result Value Ref Range Status   Specimen Description BLOOD RIGHT HAND  Final   Special Requests BOTTLES DRAWN AEROBIC AND ANAEROBIC 5CC EA  Final   Culture  Setup Time   Final    08/23/2014 03:32 Performed at [facility]    Culture   Final           BLOOD CULTURE RECEIVED NO GROWTH TO DATE CULTURE WILL BE HELD FOR 5 DAYS BEFORE ISSUING A FINAL NEGATIVE REPORT Performed at [facility]    Report Status PENDING  Incomplete  Culture, blood (routine x 2)     Status: None (Preliminary result)   Collection Time: 08/22/14  8:40 PM  Result Value Ref Range Status   Specimen Description BLOOD RIGHT ARM  Final   Special Requests   Final    BOTTLES DRAWN AEROBIC AND ANAEROBIC 10CC BLUE AND 6CC PURPLE   Culture  Setup Time   Final    08/23/2014 10:05 Performed at [facility]    Culture   Final           BLOOD CULTURE RECEIVED NO GROWTH TO DATE CULTURE WILL BE HELD FOR 5 DAYS BEFORE ISSUING A FINAL NEGATIVE REPORT Performed at [facility]    Report Status PENDING  Incomplete     Labs: Basic Metabolic  Panel:  Recent Labs Lab 08/24/14 0319 08/24/14 0648 08/24/14 0935 08/24/14 1133 08/24/14 1233  NA 126* 133* 135* 135* 135*  K 4.5 4.0 3.8 4.0 4.1  CL 98 105 107 108 107  CO2 11* 16* 15* 16* 16*  GLUCOSE 400* 197* 114* 124* 158*  BUN 14 13 12 11 10   CREATININE 0.66 0.64 0.60 0.59 0.59  CALCIUM 9.5 9.6 9.8 9.6 9.5   Liver Function Tests:  Recent Labs Lab 08/18/14 0520 08/21/14 0614  AST 8 8  ALT 6 6  ALKPHOS 125* 114  BILITOT 0.3 0.2*  PROT 6.9 7.8  ALBUMIN 2.2* 2.3*   No results for input(s): LIPASE, AMYLASE in the last 168 hours.  Recent Labs Lab 08/18/14 1420  AMMONIA 39   CBC:  Recent Labs Lab 08/19/14 0358 08/20/14 0540 08/21/14 0614 08/22/14 0435 08/23/14 0513 08/24/14 0319  WBC  27.6* 21.1* 21.2* 29.3* 28.7* 23.5*  NEUTROABS 19.6*  --   --   --   --   --   HGB 7.5* 7.0* 8.0* 8.0* 7.5* 7.0*  HCT 22.9* 21.3* 25.0* 25.6* 22.9* 22.5*  MCV 80.1 79.8 82.0 83.4 81.8 80.6  PLT 515* 501* 645* 696* 701* 694*   Cardiac Enzymes: No results for input(s): CKTOTAL, CKMB, CKMBINDEX, TROPONINI in the last 168 hours. BNP: Invalid input(s): POCBNP CBG:  Recent Labs Lab 08/24/14 1113 08/24/14 1156 08/24/14 1302 08/24/14 1407 08/24/14 1801  GLUCAP 126* 142* 177* 165* 192*    Time coordinating discharge:  Greater than 30 minutes  Signed:  Ula Couvillon, DO [facility] Pager: [phone] 08/24/2014, 6:15 PM

## 2014-08-24 NOTE — Progress Notes (Signed)
CARE MANAGEMENT NOTE 08/24/2014  Patient:  Jason [name], Jason [name]   Account Number:  [id number]  Date Initiated:  08/16/2014  Documentation initiated by:  Elissa Hefty  Subjective/Objective Assessment:   adm w bacteremia     Action/Plan:   lives w fam, pcp dr Remo Lipps fry   Anticipated DC Date:  08/22/2014   Anticipated DC Plan:  [place]  CM consult      Choice offered to / List presented to:          [facility] arranged  HH-1 RN  IV Antibiotics      [place]   Status of service:  Completed, signed off Medicare Important Message given?   (If response is "NO", the following Medicare IM given date fields will be blank) Date Medicare IM given:   Medicare IM given by:   Date Additional Medicare IM given:   Additional Medicare IM given by:    Discharge Disposition:  [place]  Per UR Regulation:  Reviewed for med. necessity/level of care/duration of stay  If discussed at [place] of Stay Meetings, dates discussed:   08/17/2014    Comments:  08/24/2014 1711 [name] aware of scheduled dc with HH RN for IV abx and wound care. Jonnie Finner RN CCM Case Mgmt phone [phone]  08/18/2014 1300 [facility] -pt is apart of [place], and assigned to [facility]. If dc home with need HH, [place] RN orders.  Jonnie Finner RN CCM Case Mgmt phone [phone]

## 2014-08-25 LAB — RETICULOCYTES
RBC.: 2.43 MIL/uL — AB (ref 4.22–5.81)
RETIC COUNT ABSOLUTE: 51 10*3/uL (ref 19.0–186.0)
Retic Ct Pct: 2.1 % (ref 0.4–3.1)

## 2014-08-25 LAB — PREPARE RBC (CROSSMATCH)

## 2014-08-25 LAB — IRON AND TIBC
Iron: 38 ug/dL — ABNORMAL LOW (ref 42–135)
SATURATION RATIOS: 15 % — AB (ref 20–55)
TIBC: 257 ug/dL (ref 215–435)
UIBC: 219 ug/dL (ref 125–400)

## 2014-08-25 LAB — GLUCOSE, CAPILLARY
GLUCOSE-CAPILLARY: 77 mg/dL (ref 70–99)
GLUCOSE-CAPILLARY: 107 mg/dL — AB (ref 70–99)
Glucose-Capillary: 148 mg/dL — ABNORMAL HIGH (ref 70–99)
Glucose-Capillary: 99 mg/dL (ref 70–99)

## 2014-08-25 LAB — BASIC METABOLIC PANEL
Anion gap: 12 (ref 5–15)
BUN: 6 mg/dL (ref 6–23)
CO2: 16 mEq/L — ABNORMAL LOW (ref 19–32)
CREATININE: 0.59 mg/dL (ref 0.50–1.35)
Calcium: 9.6 mg/dL (ref 8.4–10.5)
Chloride: 107 mEq/L (ref 96–112)
Glucose, Bld: 107 mg/dL — ABNORMAL HIGH (ref 70–99)
Potassium: 3.9 mEq/L (ref 3.7–5.3)
Sodium: 135 mEq/L — ABNORMAL LOW (ref 137–147)

## 2014-08-25 LAB — CBC
HCT: 20 % — ABNORMAL LOW (ref 39.0–52.0)
Hemoglobin: 6.4 g/dL — CL (ref 13.0–17.0)
MCH: 25.4 pg — ABNORMAL LOW (ref 26.0–34.0)
MCHC: 32 g/dL (ref 30.0–36.0)
MCV: 79.4 fL (ref 78.0–100.0)
PLATELETS: 667 10*3/uL — AB (ref 150–400)
RBC: 2.52 MIL/uL — ABNORMAL LOW (ref 4.22–5.81)
RDW: 16.4 % — ABNORMAL HIGH (ref 11.5–15.5)
WBC: 14 10*3/uL — ABNORMAL HIGH (ref 4.0–10.5)

## 2014-08-25 LAB — FERRITIN: Ferritin: 158 ng/mL (ref 22–322)

## 2014-08-25 LAB — ABO/RH: ABO/RH(D): O POS

## 2014-08-25 LAB — VITAMIN B12: Vitamin B-12: 961 pg/mL — ABNORMAL HIGH (ref 211–911)

## 2014-08-25 LAB — FOLATE: FOLATE: 7.6 ng/mL

## 2014-08-25 MED ORDER — IBUPROFEN 800 MG PO TABS
800.0000 mg | ORAL_TABLET | Freq: Four times a day (QID) | ORAL | Status: DC | PRN
  Filled 2014-08-25: qty 1

## 2014-08-25 MED ORDER — ACETAMINOPHEN 500 MG PO TABS: 500.0000 mg | ORAL_TABLET | Freq: Four times a day (QID) | ORAL | Status: DC | PRN

## 2014-08-25 MED ORDER — FUROSEMIDE 10 MG/ML IJ SOLN
40.0000 mg | Freq: Once | INTRAMUSCULAR | Status: AC
  Administered 2014-08-26: 40 mg via INTRAVENOUS
  Filled 2014-08-25: qty 4

## 2014-08-25 MED ORDER — SODIUM CHLORIDE 0.9 % IV SOLN
Freq: Once | INTRAVENOUS | Status: AC
  Administered 2014-08-25: 10:00:00 via INTRAVENOUS

## 2014-08-25 MED ORDER — ACETAMINOPHEN 500 MG PO TABS
500.0000 mg | ORAL_TABLET | Freq: Three times a day (TID) | ORAL | Status: DC | PRN
  Administered 2014-08-25: 500 mg via ORAL
  Filled 2014-08-25: qty 1

## 2014-08-25 NOTE — Progress Notes (Signed)
Pt Temp 100.4. MD made aware. New order to give tylenol and motrin and hold the transfusion for one hour.

## 2014-08-25 NOTE — Progress Notes (Signed)
IV nurse up to draw lab for type and cross.

## 2014-08-25 NOTE — Progress Notes (Addendum)
Note to primary care physician. Once IV antibiotics are finished. Discontinue PICC line promptly.  Agent seen today, case discussed with him and his mother, due for discharge per discharge summary and plan documented by Dr. Shanon Brow Tat yesterday. No change. Look at discharge summary done by Dr. Carles Collet. Patient is symptom free with stable vital signs.  Filed Vitals:   08/24/14 2143 08/25/14 0312 08/25/14 0548 08/25/14 0734  BP:   117/76   Pulse:   88   Temp:   99.5 F (37.5 C)   TempSrc:   Oral   Resp:   15   Height:      Weight:      SpO2: 100% 100% 100% 98%     Addendum. Patient's hemoglobin came down to 6.4, he is chronically anemic, he's been getting normal saline nonstop for the last 4 days and I think much of his hemoglobin drop is due to dilution. We will check anemia panel and transfuse 1 unit. Hold discharge tomorrow.

## 2014-08-25 NOTE — Progress Notes (Signed)
ANTIBIOTIC CONSULT NOTE - Follow Up  Pharmacy Consult for Vancomycin Indication: sepsis / MSSA bacteremia  Allergies  Allergen Reactions  . Cefuroxime Axetil Anaphylaxis  . Penicillins Anaphylaxis and Other (See Comments)    ?can take amoxicillin?  [name] [Benzonatate] Anaphylaxis  . Shellfish Allergy Itching and Other (See Comments)    Took benadryl to alleviate reaction   Patient Measurements: Height: 5\' 8"  (172.7 cm) Weight: 129 lb 6.6 oz (58.7 kg) IBW/kg (Calculated) : 68.4  Vital Signs: Temp: 99.5 F (37.5 C) (11/18 0548) Temp Source: Oral (11/18 0548) BP: 117/76 mmHg (11/18 0548) Pulse Rate: 88 (11/18 0548) Intake/Output from previous day: 11/17 0701 - 11/18 0700 In: 4406.7 [P.O.:720; I.V.:2936.7; IV Piggyback:750] Out: 4950 [Urine:4950] Intake/Output from this shift:    Labs:  Recent Labs  08/23/14 0513  08/24/14 0319  08/24/14 0935 08/24/14 1133 08/24/14 1233  WBC 28.7*  --  23.5*  --   --   --   --   HGB 7.5*  --  7.0*  --   --   --   --   PLT 701*  --  694*  --   --   --   --   CREATININE 0.86  < > 0.66  < > 0.60 0.59 0.59  < > = values in this interval not displayed. Estimated Creatinine Clearance: 112.1 mL/min (by C-G formula based on Cr of 0.59).  Recent Labs  08/22/14 1025  VANCOTROUGH 27.9*    Medical History: Past Medical History  Diagnosis Date  . GERD (gastroesophageal reflux disease)   . Asthma   . Hx MRSA infection     on face  . Gastroparesis   . Diabetic neuropathy   . Seizures   . Stroke     spinal stroke in 4/15  . Diabetes mellitus     sees Dr. Loanne Drilling   . Family history of anesthesia complication     Pt mother can't have epidural procedures  . Dysrhythmia   . Pneumonia   . Arthritis   . Fibromyalgia    Assessment: Mr. Brookens continues on antibiotics for bacteremia.  Total of 6 weeks of vancomycin therapy planned (until 09/24/14)   Vanc Trough Summary: 11/8 VT = 13.2 mcg/ml on Vanc. 500mg  every 12 hours 11/11  VT = 20.6 mcg/ml on Vanc. 750mg  every 12 hours 11/15 VT = 27.9 mcg/ml on Vanc 750mg  every 12 hours  Dose adjusted to Vancomycin 1250 mg iv Q 24 hours  Goal of Therapy:  Vancomycin trough level 15-20 mcg/ml  Plan:  Continue  IV Vancomycin to 1250 mg IV q 24 hours Weekly trough levels  Thank you. Anette Guarneri, PharmD [phone] 08/25/2014 9:08 AM

## 2014-08-25 NOTE — Progress Notes (Signed)
CRITICAL VALUE ALERT  Critical value received:  Hgb 6.4  Date of notification:  08/25/14  Time of notification:  10:36 am  Critical value read back:Yes.    Nurse who received alert:  Claudia Desanctis  MD notified (1st page):  Candiss Norse  Time of first page:  10:36 am  MD notified (2nd page):  Time of second page:  Responding MD: Candiss Norse  Time MD responded: 10:37am

## 2014-08-25 NOTE — Plan of Care (Signed)
Problem: Phase II Progression Outcomes Goal: Vital signs remain stable Outcome: Completed/Met Date Met:  08/25/14     

## 2014-08-25 NOTE — Progress Notes (Signed)
Physical Therapy Wound Treatment Patient Details  Name: Jason [name] MRN: [id number] Date of Birth: 09-27-1986  Today's Date: 08/25/2014 Time: 1130-1202 Time Calculation (min): 32 min  Subjective     Pain Score: Pain Score: Asleep  Wound Assessment  Pressure Ulcer 08/13/14 Unstageable - Full thickness tissue loss in which the base of the ulcer is covered by slough (yellow, tan, gray, green or brown) and/or eschar (tan, brown or black) in the wound bed. 4cm x 3 cm  necrotic (Active)  Dressing Type Moist to dry;Gauze (Comment);Barrier Film (skin prep);Peripads;Opsite Post-Op Visible 08/25/2014  1:37 PM  Dressing Changed 08/25/2014  1:37 PM  Dressing Change Frequency Daily 08/25/2014  1:37 PM  State of Healing Eschar 08/25/2014  1:37 PM  Site / Wound Assessment Pink;Red;Yellow 08/25/2014  1:37 PM  % Wound base Red or Granulating 90% 08/25/2014  1:37 PM  % Wound base Yellow 10% 08/25/2014  1:37 PM  % Wound base Black 0% 08/25/2014  1:37 PM  % Wound base Other (Comment) 0% 08/25/2014  1:37 PM  Peri-wound Assessment Intact 08/25/2014  1:37 PM  Wound Length (cm) 4 cm 08/19/2014 11:00 AM  Wound Width (cm) 3.5 cm 08/19/2014 11:00 AM  Wound Depth (cm) 3 cm 08/19/2014 11:00 AM  Margins Unattached edges (unapproximated) 08/25/2014  1:37 PM  Drainage Amount Minimal 08/25/2014  1:37 PM  Drainage Description Serosanguineous 08/25/2014  1:37 PM  Treatment Cleansed 08/24/2014 10:48 AM     Pressure Ulcer 08/13/14 Stage II -  Partial thickness loss of dermis presenting as a shallow open ulcer with a red, pink wound bed without slough. 2cm x 3 cm necrotic (Active)  Dressing Type None 08/24/2014 10:48 AM  Dressing Clean;Dry;Intact 08/21/2014  9:35 AM  Dressing Change Frequency PRN 08/21/2014  9:35 AM  State of Healing Non-healing 08/17/2014  7:50 AM  Site / Wound Assessment Clean;Dry;Pink 08/24/2014 10:48 AM  % Wound base Red or Granulating 100% 08/21/2014  9:35 AM  % Wound base Black 20%  08/13/2014  8:00 AM  Peri-wound Assessment Intact;Pink 08/24/2014 10:48 AM  Margins Unattached edges (unapproximated) 08/21/2014  9:35 AM  Drainage Amount Scant 08/24/2014 10:48 AM  Drainage Description Serous 08/24/2014 10:48 AM  Treatment Cleansed 08/24/2014 10:48 AM     Wound / Incision (Open or Dehisced) 08/20/14 Other (Comment) Finger (Comment which one) Left middle, eschar looking wound (Active)  Dressing Type None 08/25/2014  7:47 AM  Site / Wound Assessment Dry 08/25/2014  7:47 AM  % Wound base Black 100% 08/25/2014  7:47 AM  Peri-wound Assessment Intact 08/25/2014  7:47 AM  Margins Unattached edges (unapproximated) 08/25/2014  7:47 AM  Drainage Amount None 08/25/2014  7:47 AM  Treatment Cleansed 08/25/2014 12:43 AM   Hydrotherapy Pulsed lavage therapy - wound location: R ischium Pulsed Lavage with Suction (psi): 4 psi Pulsed Lavage with Suction - Normal Saline Used: 1000 mL Pulsed Lavage Tip: Tip with splash shield Selective Debridement Selective Debridement - Location: R ischium Selective Debridement - Tools Used: Forceps;Scissors Selective Debridement - Tissue Removed: necrotic tissues/ eschar   Wound Assessment and Plan  Wound Therapy - Assess/Plan/Recommendations Wound Therapy - Clinical Statement: Pt's wound continues to make steady progress.  Wound Therapy - Functional Problem List: Decreased mobility Factors Delaying/Impairing Wound Healing: Diabetes Mellitus;Immobility;Tobacco use Hydrotherapy Plan: Debridement;Dressing change;Patient/family education;Pulsatile lavage with suction Wound Therapy - Frequency: 6X / week Wound Therapy - Follow Up Recommendations: Wound Care Center;Home health RN Wound Plan: see above  Wound Therapy Goals- Improve the function of patient's integumentary  system by progressing the wound(s) through the phases of wound healing (inflammation - proliferation - remodeling) by: Decrease Necrotic Tissue to: 0 Decrease Necrotic Tissue -  Progress: Goal set today Increase Granulation Tissue to: 100 Increase Granulation Tissue - Progress: Goal set today  Goals will be updated until maximal potential achieved or discharge criteria met.  Discharge criteria: when goals achieved, discharge from hospital, MD decision/surgical intervention, no progress towards goals, refusal/missing three consecutive treatments without notification or medical reason.  GP     , [name] 08/25/2014, 1:40 PM  08/25/2014  Donnella Sham, [place] [phone]  (pager)

## 2014-08-25 NOTE — Telephone Encounter (Signed)
i'll stop in tomorrow.

## 2014-08-26 LAB — GLUCOSE, CAPILLARY
GLUCOSE-CAPILLARY: 39 mg/dL — AB (ref 70–99)
GLUCOSE-CAPILLARY: 101 mg/dL — AB (ref 70–99)
GLUCOSE-CAPILLARY: 52 mg/dL — AB (ref 70–99)
GLUCOSE-CAPILLARY: 184 mg/dL — AB (ref 70–99)
Glucose-Capillary: 41 mg/dL — CL (ref 70–99)
Glucose-Capillary: 60 mg/dL — ABNORMAL LOW (ref 70–99)
Glucose-Capillary: 97 mg/dL (ref 70–99)

## 2014-08-26 LAB — HEMOGLOBIN AND HEMATOCRIT, BLOOD
HCT: 27 % — ABNORMAL LOW (ref 39.0–52.0)
Hemoglobin: 8.6 g/dL — ABNORMAL LOW (ref 13.0–17.0)

## 2014-08-26 MED ORDER — HEPARIN SOD (PORK) LOCK FLUSH 100 UNIT/ML IV SOLN
250.0000 [IU] | INTRAVENOUS | Status: AC | PRN
  Administered 2014-08-26: 250 [IU]

## 2014-08-26 NOTE — Progress Notes (Signed)
Patient discharge teaching given, including activity, diet, follow-up appoints, and medications. Patient verbalized understanding of all discharge instructions. PICC access was flushed and heparin flushed by IV team. Vitals are stable. Skin is intact except as charted in most recent assessments. Pt to be escorted out by EMT/PTAR, to be driven home by EMT/PTAR.Jason [name]

## 2014-08-26 NOTE — Discharge Summary (Signed)
On the day of discharge 08/25/2014. Patient's lab work indicated worsening anemia of chronic disease with a hemoglobin of 6.4. He was around 8-9 L positive fluid balance and this was anemia of chronic disease worse with hemodilution. His anemia panel was unremarkable. He received 1 unit of packed RBC transfusion followed by 40 g of IV Lasix and his hemoglobin has come up over 2 points, he feels much better. No history of nausea vomiting, no history of hematemesis, blood in stool or black stool. He will be discharged home as per plan indicated by discharge summary done by Dr.Tat.  Patient  today's symptom free with stable vital signs. No changes made. Request PCP to check a CBC, CMP in a week, discontinue PICC line once IV antibiotics have finished.  Today's Vitals   08/26/14 0132 08/26/14 0613 08/26/14 0815 08/26/14 0831  BP:  111/80    Pulse:  83    Temp:  99.4 F (37.4 C)    TempSrc:  Oral    Resp:  18    Height:      Weight:      SpO2: 100% 100%  100%  PainSc:   0-No pain

## 2014-08-26 NOTE — Progress Notes (Signed)
Pt to receive 1 unit of PRBCs. RN started blood at 2138 and soon after the start of infusion the pump states air in line. After numerous attempts to release air bubbles RN calls Charge nurse to assist. By the time pump is clear of air and running without alerts it was  2158. Time documented in flowsheets. Will continue to monitor pt.

## 2014-08-26 NOTE — Clinical Social Work Note (Signed)
Per MD patient ready to DC back to Home. RN, patient/family (address confirmed by family) notified of patient's DC. DC packet on patient's chart. Ambulance transport requested for patient. CSW signing off at this time.   Liz Beach MSW, [place], [place], [phone]

## 2014-08-26 NOTE — Progress Notes (Signed)
Physical Therapy Wound Treatment Patient Details  Name: Jason [name] MRN: [id number] Date of Birth: May 21, 1986  Today's Date: 08/26/2014 Time: 1607-3710 Time Calculation (min): 26 min  Subjective  Subjective: The plan is to go home today  Pain Score: Pain Score: 0-No pain  Wound Assessment  Pressure Ulcer 08/13/14 Unstageable - Full thickness tissue loss in which the base of the ulcer is covered by slough (yellow, tan, gray, green or brown) and/or eschar (tan, brown or black) in the wound bed. 4cm x 3 cm  necrotic (Active)  Dressing Type Moist to dry;Gauze (Comment);Barrier Film (skin prep);Peripads;Opsite Post-Op Visible 08/26/2014 10:31 AM  Dressing Changed 08/26/2014 10:31 AM  Dressing Change Frequency Daily 08/26/2014 10:31 AM  State of Healing Early/partial granulation 08/26/2014 10:31 AM  Site / Wound Assessment Pink;Red 08/26/2014 10:31 AM  % Wound base Red or Granulating 95% 08/26/2014 10:31 AM  % Wound base Yellow 5% 08/26/2014 10:31 AM  % Wound base Black 0% 08/26/2014 10:31 AM  % Wound base Other (Comment) 0% 08/26/2014 10:31 AM  Peri-wound Assessment Intact 08/26/2014 10:31 AM  Wound Length (cm) 4 cm 08/26/2014 10:31 AM  Wound Width (cm) 3.5 cm 08/26/2014 10:31 AM  Wound Depth (cm) 3.5 cm 08/26/2014 10:31 AM  Tunneling (cm) 3.5 08/26/2014 10:31 AM  Margins Unattached edges (unapproximated) 08/26/2014 10:31 AM  Drainage Amount Minimal 08/26/2014 10:31 AM  Drainage Description Serosanguineous 08/26/2014 10:31 AM  Treatment Cleansed;Debridement (Selective);Hydrotherapy (Pulse lavage);Packing (Saline gauze) 08/26/2014 10:31 AM     Pressure Ulcer 08/13/14 Stage II -  Partial thickness loss of dermis presenting as a shallow open ulcer with a red, pink wound bed without slough. 2cm x 3 cm necrotic (Active)  Dressing Type None 08/24/2014 10:48 AM  Dressing Clean;Dry;Intact 08/21/2014  9:35 AM  Dressing Change Frequency PRN 08/21/2014  9:35 AM  State of Healing  Non-healing 08/17/2014  7:50 AM  Site / Wound Assessment Clean;Dry;Pink 08/24/2014 10:48 AM  % Wound base Red or Granulating 100% 08/21/2014  9:35 AM  % Wound base Black 20% 08/13/2014  8:00 AM  Peri-wound Assessment Intact;Pink 08/24/2014 10:48 AM  Margins Unattached edges (unapproximated) 08/21/2014  9:35 AM  Drainage Amount Scant 08/24/2014 10:48 AM  Drainage Description Serous 08/24/2014 10:48 AM  Treatment Cleansed 08/24/2014 10:48 AM     Wound / Incision (Open or Dehisced) 08/20/14 Other (Comment) Finger (Comment which one) Left middle, eschar looking wound (Active)  Dressing Type None 08/26/2014  8:15 AM  Site / Wound Assessment Dry 08/26/2014  8:15 AM  % Wound base Black 100% 08/26/2014  8:15 AM  Peri-wound Assessment Intact 08/26/2014  8:15 AM  Margins Unattached edges (unapproximated) 08/26/2014  8:15 AM  Drainage Amount None 08/26/2014  8:15 AM  Treatment Cleansed 08/25/2014 12:43 AM   Hydrotherapy Pulsed lavage therapy - wound location: R ischium Pulsed Lavage with Suction (psi): 4 psi Pulsed Lavage with Suction - Normal Saline Used: 1000 mL Pulsed Lavage Tip: Tip with splash shield Selective Debridement Selective Debridement - Location: R ischium Selective Debridement - Tools Used: Forceps;Scissors Selective Debridement - Tissue Removed: necrotic tissues/ eschar   Wound Assessment and Plan  Wound Therapy - Assess/Plan/Recommendations Wound Therapy - Clinical Statement: Pt's wound continues to make steady progress.  Wound Therapy - Functional Problem List: Decreased mobility Factors Delaying/Impairing Wound Healing: Diabetes Mellitus;Immobility;Tobacco use Hydrotherapy Plan: Debridement;Dressing change;Patient/family education;Pulsatile lavage with suction Wound Therapy - Frequency: 6X / week Wound Therapy - Follow Up Recommendations: Wound Care Center;Home health RN Wound Plan: see above  Wound Therapy  Goals- Improve the function of patient's integumentary system  by progressing the wound(s) through the phases of wound healing (inflammation - proliferation - remodeling) by: Decrease Necrotic Tissue to: 0 Decrease Necrotic Tissue - Progress: Progressing toward goal Increase Granulation Tissue to: 100 Increase Granulation Tissue - Progress: Progressing toward goal Goals/treatment plan/discharge plan were made with and agreed upon by patient/family: Yes Wound Therapy - Potential for Goals: Good  Goals will be updated until maximal potential achieved or discharge criteria met.  Discharge criteria: when goals achieved, discharge from hospital, MD decision/surgical intervention, no progress towards goals, refusal/missing three consecutive treatments without notification or medical reason.  [name], [name] 08/26/2014, 10:35 AM 08/26/2014  Donnella Sham, [place] [phone]  (pager)

## 2014-08-27 ENCOUNTER — Telehealth: Payer: Self-pay | Admitting: Family Medicine

## 2014-08-27 MED ORDER — ALPRAZOLAM 1 MG PO TABS: 1.0000 mg | ORAL_TABLET | Freq: Four times a day (QID) | ORAL | Status: DC | PRN

## 2014-08-27 NOTE — Telephone Encounter (Signed)
Call in Xanax 1 mg to take every 6 hours prn anxiety, #120 with 2 rf

## 2014-08-27 NOTE — Telephone Encounter (Signed)
I called in script and spoke with pt's mom [name].

## 2014-08-27 NOTE — Telephone Encounter (Signed)
Olin Hauser needs pt wound care order and also  Pt has a new  wound on left foot on 4th toe 2 x 1 x 0.1 cm. Fax to [facility] [phone]

## 2014-08-27 NOTE — Telephone Encounter (Signed)
I faxed script to below number.

## 2014-08-27 NOTE — Telephone Encounter (Signed)
Pt's mom [employer] called and she is requesting something to help pt sleep and rest. Pt is not sleeping well at night, he just states that he cannot go to sleep or that he just does not feel sleepy at bedtime. Pt feels anxious at night.

## 2014-08-28 LAB — TYPE AND SCREEN
ABO/RH(D): O POS
Antibody Screen: NEGATIVE
UNIT DIVISION: 0
Unit division: 0

## 2014-08-29 LAB — CULTURE, BLOOD (ROUTINE X 2)
CULTURE: NO GROWTH
Culture: NO GROWTH

## 2014-08-30 ENCOUNTER — Telehealth: Payer: Self-pay | Admitting: Family Medicine

## 2014-08-30 MED ORDER — COLLAGENASE 250 UNIT/GM EX OINT: 1.0000 "application " | TOPICAL_OINTMENT | Freq: Every day | CUTANEOUS | Status: DC

## 2014-08-30 NOTE — Telephone Encounter (Signed)
I spoke with Oris Drone from [facility], script was sent e-scribe and she will let the family know.

## 2014-08-30 NOTE — Telephone Encounter (Signed)
Call in Santyl ointment to be applied once daily, 90 grams with 5 rf

## 2014-08-30 NOTE — Telephone Encounter (Signed)
Pt was sent home from hospital without a script for Santyl ointment for wound debridement. Can we send in a script to [facility] for pt?

## 2014-08-31 ENCOUNTER — Ambulatory Visit: Payer: Medicaid Other | Admitting: Family Medicine

## 2014-09-01 ENCOUNTER — Telehealth: Payer: Self-pay | Admitting: *Deleted

## 2014-09-01 NOTE — Telephone Encounter (Signed)
Mom called on behalf of pt, says Jason [name] has been without pain meds for two months, he has missed previous appt with Dr. Naaman Plummer due to hospitalization. Mom is pleading for [facility] to get percocet before this weekend. Last phone call from mom was on 08/23/2014 when [name] was in the hospital, He was discharged the 17th of November. Needs appt?

## 2014-09-03 NOTE — Telephone Encounter (Signed)
I will allow his mom to come in and pick up percocet to help him out. Jason [name] has been pretty sick the last few months. Further rx'es will require an appt

## 2014-09-06 ENCOUNTER — Encounter (HOSPITAL_COMMUNITY): Payer: Self-pay | Admitting: *Deleted

## 2014-09-06 ENCOUNTER — Telehealth: Payer: Self-pay | Admitting: *Deleted

## 2014-09-06 ENCOUNTER — Emergency Department (HOSPITAL_COMMUNITY)
Admission: EM | Admit: 2014-09-06 | Discharge: 2014-09-06 | Disposition: A | Discharge status: Home / Self Care | Payer: Medicaid Other | Attending: Emergency Medicine | Admitting: Emergency Medicine

## 2014-09-06 DIAGNOSIS — Z794 Long term (current) use of insulin: Secondary | ICD-10-CM | POA: Insufficient documentation

## 2014-09-06 DIAGNOSIS — Z8701 Personal history of pneumonia (recurrent): Secondary | ICD-10-CM | POA: Insufficient documentation

## 2014-09-06 DIAGNOSIS — Z792 Long term (current) use of antibiotics: Secondary | ICD-10-CM | POA: Insufficient documentation

## 2014-09-06 DIAGNOSIS — Z8614 Personal history of Methicillin resistant Staphylococcus aureus infection: Secondary | ICD-10-CM | POA: Insufficient documentation

## 2014-09-06 DIAGNOSIS — Z791 Long term (current) use of non-steroidal anti-inflammatories (NSAID): Secondary | ICD-10-CM | POA: Insufficient documentation

## 2014-09-06 DIAGNOSIS — Z95828 Presence of other vascular implants and grafts: Secondary | ICD-10-CM

## 2014-09-06 DIAGNOSIS — J45909 Unspecified asthma, uncomplicated: Secondary | ICD-10-CM | POA: Insufficient documentation

## 2014-09-06 DIAGNOSIS — Z452 Encounter for adjustment and management of vascular access device: Secondary | ICD-10-CM | POA: Diagnosis present

## 2014-09-06 DIAGNOSIS — Z79899 Other long term (current) drug therapy: Secondary | ICD-10-CM | POA: Diagnosis not present

## 2014-09-06 DIAGNOSIS — Z8673 Personal history of transient ischemic attack (TIA), and cerebral infarction without residual deficits: Secondary | ICD-10-CM | POA: Insufficient documentation

## 2014-09-06 DIAGNOSIS — M1990 Unspecified osteoarthritis, unspecified site: Secondary | ICD-10-CM | POA: Insufficient documentation

## 2014-09-06 DIAGNOSIS — K219 Gastro-esophageal reflux disease without esophagitis: Secondary | ICD-10-CM | POA: Insufficient documentation

## 2014-09-06 DIAGNOSIS — E1140 Type 2 diabetes mellitus with diabetic neuropathy, unspecified: Secondary | ICD-10-CM | POA: Insufficient documentation

## 2014-09-06 DIAGNOSIS — G40909 Epilepsy, unspecified, not intractable, without status epilepticus: Secondary | ICD-10-CM | POA: Diagnosis not present

## 2014-09-06 DIAGNOSIS — Z720 Tobacco use: Secondary | ICD-10-CM | POA: Insufficient documentation

## 2014-09-06 DIAGNOSIS — Z880 Allergy status to penicillin: Secondary | ICD-10-CM | POA: Insufficient documentation

## 2014-09-06 LAB — CBC WITH DIFFERENTIAL/PLATELET
BASOS PCT: 1 % (ref 0–1)
Basophils Absolute: 0.1 10*3/uL (ref 0.0–0.1)
Eosinophils Absolute: 0.6 10*3/uL (ref 0.0–0.7)
Eosinophils Relative: 5 % (ref 0–5)
HCT: 24.1 % — ABNORMAL LOW (ref 39.0–52.0)
Hemoglobin: 7.7 g/dL — ABNORMAL LOW (ref 13.0–17.0)
Lymphocytes Relative: 32 % (ref 12–46)
Lymphs Abs: 3.9 10*3/uL (ref 0.7–4.0)
MCH: 26.2 pg (ref 26.0–34.0)
MCHC: 32 g/dL (ref 30.0–36.0)
MCV: 82 fL (ref 78.0–100.0)
MONO ABS: 1.9 10*3/uL — AB (ref 0.1–1.0)
Monocytes Relative: 16 % — ABNORMAL HIGH (ref 3–12)
NEUTROS PCT: 46 % (ref 43–77)
Neutro Abs: 5.6 10*3/uL (ref 1.7–7.7)
PLATELETS: 593 10*3/uL — AB (ref 150–400)
RBC: 2.94 MIL/uL — ABNORMAL LOW (ref 4.22–5.81)
RDW: 15.3 % (ref 11.5–15.5)
WBC: 12.1 10*3/uL — ABNORMAL HIGH (ref 4.0–10.5)

## 2014-09-06 LAB — COMPREHENSIVE METABOLIC PANEL
ALK PHOS: 90 U/L (ref 39–117)
ALT: 8 U/L (ref 0–53)
AST: 10 U/L (ref 0–37)
Albumin: 2.6 g/dL — ABNORMAL LOW (ref 3.5–5.2)
Anion gap: 13 (ref 5–15)
BUN: 15 mg/dL (ref 6–23)
CO2: 21 meq/L (ref 19–32)
Calcium: 10.7 mg/dL — ABNORMAL HIGH (ref 8.4–10.5)
Chloride: 98 mEq/L (ref 96–112)
Creatinine, Ser: 1.07 mg/dL (ref 0.50–1.35)
GLUCOSE: 103 mg/dL — AB (ref 70–99)
POTASSIUM: 4.4 meq/L (ref 3.7–5.3)
SODIUM: 132 meq/L — AB (ref 137–147)
Total Bilirubin: 0.2 mg/dL — ABNORMAL LOW (ref 0.3–1.2)
Total Protein: 8.4 g/dL — ABNORMAL HIGH (ref 6.0–8.3)

## 2014-09-06 LAB — C-REACTIVE PROTEIN: CRP: 4 mg/dL — ABNORMAL HIGH (ref <0.60)

## 2014-09-06 LAB — SEDIMENTATION RATE: Sed Rate: 119 mm/hr — ABNORMAL HIGH (ref 0–16)

## 2014-09-06 LAB — VANCOMYCIN, TROUGH: VANCOMYCIN TR: 25.2 ug/mL — AB (ref 10.0–20.0)

## 2014-09-06 MED ORDER — SODIUM CHLORIDE 0.9 % IJ SOLN: 10.0000 mL | INTRAMUSCULAR | Status: DC | PRN

## 2014-09-06 MED ORDER — HEPARIN SOD (PORK) LOCK FLUSH 100 UNIT/ML IV SOLN
250.0000 [IU] | INTRAVENOUS | Status: AC | PRN
  Administered 2014-09-06: 250 [IU]

## 2014-09-06 MED ORDER — SODIUM CHLORIDE 0.9 % IV BOLUS (SEPSIS)
1000.0000 mL | Freq: Once | INTRAVENOUS | Status: AC
  Administered 2014-09-06: 1000 mL via INTRAVENOUS

## 2014-09-06 MED ORDER — SODIUM CHLORIDE 0.9 % IJ SOLN: 10.0000 mL | Freq: Two times a day (BID) | INTRAMUSCULAR | Status: DC

## 2014-09-06 NOTE — Progress Notes (Signed)
Peripherally Inserted Central Catheter/Midline Placement  The IV Nurse has discussed with the patient and/or persons authorized to consent for the patient, the purpose of this procedure and the potential benefits and risks involved with this procedure.  The benefits include less needle sticks, lab draws from the catheter and patient may be discharged home with the catheter.  Risks include, but not limited to, infection, bleeding, blood clot (thrombus formation), and puncture of an artery; nerve damage and irregular heat beat.  Alternatives to this procedure were also discussed. PICC consent obtained from mother PICC/Midline Placement Documentation  PICC / Midline Single Lumen 70/78/67 PICC Left Basilic 46 cm 1 cm (Active)  Indication for Insertion or Continuance of Line Home intravenous therapies (PICC only) 08/26/2014  8:15 AM  Exposed Catheter (cm) 1 cm 08/26/2014  2:52 AM  Site Assessment Clean;Dry;Intact 08/26/2014  4:11 PM  Line Status Flushed;Blood return noted 08/26/2014  4:11 PM  Dressing Type Transparent 08/26/2014  4:11 PM  Dressing Status Clean;Dry;Intact;Antimicrobial disc in place 08/26/2014  4:11 PM  Line Care Connections checked and tightened 08/26/2014  4:11 PM  Dressing Change Due 08/31/14 08/26/2014  2:52 AM       Murvin Natal 09/06/2014, 3:46 PM

## 2014-09-06 NOTE — ED Notes (Signed)
Charge Rn and Dr.Ray aware that pts mother is requesting percocet 10 refill. No new orders per MD. Mother is contacting pain management doctor

## 2014-09-06 NOTE — ED Notes (Signed)
IV team at bedside 

## 2014-09-06 NOTE — ED Notes (Signed)
Pt given diet ginger ale to drink  

## 2014-09-06 NOTE — ED Notes (Signed)
Paged PTAR for transport  

## 2014-09-06 NOTE — ED Provider Notes (Signed)
CSN: [id number]     Arrival date & time 09/06/14  1150 History   First MD Initiated Contact with Patient 09/06/14 1153     Chief Complaint  Patient presents with  . Vascular Access Problem    picc line problem     (Consider location/radiation/quality/duration/timing/severity/associated sxs/prior Treatment) The history is provided by the patient and a relative.  pt with hx spinal cord injury, quadriplegia, decubitus ulcers, osteomyelitis, on home iv abx.  Pt has picc line for 4 weeks iv abx, and family states picc line inadvertently came out this morning. Last had dose antibiotic yesterday.  No fevers. Since d/c from hospital, health and functional ability c/w baseline. No new symptoms or deterioration in condition.  Pt/family presenting requesting routine labs and new picc line.  No cp or sob.  Family states picc line came out in entirety, and did not break off.  That picc has been discarded and was not brought to ED.     Past Medical History  Diagnosis Date  . GERD (gastroesophageal reflux disease)   . Asthma   . Hx MRSA infection     on face  . Gastroparesis   . Diabetic neuropathy   . Seizures   . Stroke     spinal stroke in 4/15  . Diabetes mellitus     sees Dr. Loanne Drilling   . Family history of anesthesia complication     Pt mother can't have epidural procedures  . Dysrhythmia   . Pneumonia   . Arthritis   . Fibromyalgia    Past Surgical History  Procedure Laterality Date  . Tonsillectomy    . Multiple extractions with alveoloplasty N/A 08/03/2014    Procedure: MULTIPLE EXTRACTIONS;  Surgeon: Gae Bon, DDS;  Location: [facility];  Service: Oral Surgery;  Laterality: N/A;  . Tee without cardioversion N/A 08/17/2014    Procedure: TRANSESOPHAGEAL ECHOCARDIOGRAM (TEE);  Surgeon: Dorothy Spark, MD;  Location: [facility] ENDOSCOPY;  Service: Cardiovascular;  Laterality: N/A;   Family History  Problem Relation Age of Onset  . Diabetes Father   . Hypertension Father   . Asthma       fhx  . Hypertension      fhx  . Stroke      fhx  . Heart disease Mother    History  Substance Use Topics  . Smoking status: Current Some Day Smoker    Types: Cigars, Cigarettes    Last Attempt to Quit: 01/29/2014  . Smokeless tobacco: Never Used     Comment: e-cigarette and cigars currently  . Alcohol Use: No     Comment: rarely    Review of Systems  Constitutional: Negative for fever.  HENT: Negative for sore throat.   Eyes: Negative for redness.  Respiratory: Negative for shortness of breath.   Cardiovascular: Negative for chest pain.  Gastrointestinal: Negative for nausea, vomiting and abdominal pain.  Genitourinary:       Has foley, normal uo.   Musculoskeletal: Negative for back pain and neck pain.  Skin: Negative for rash.  Neurological: Negative for headaches.  Hematological: Does not bruise/bleed easily.  Psychiatric/Behavioral: Negative for confusion.      Allergies  Cefuroxime axetil; Penicillins; Tessalon; and Shellfish allergy  Home Medications   Prior to Admission medications   Medication Sig Start Date End Date Taking? Authorizing Provider  albuterol (PROVENTIL) (2.5 MG/3ML) 0.083% nebulizer solution Take 3 mLs (2.5 mg total) by nebulization every 4 (four) hours as needed for wheezing or shortness of breath.  04/27/14  Yes [name], MD  ALPRAZolam Duanne Moron) 1 MG tablet Take 1 tablet (1 mg total) by mouth every 6 (six) hours as needed for anxiety. 08/27/14  Yes [name], MD  amLODipine (NORVASC) 2.5 MG tablet Take 1 tablet (2.5 mg total) by mouth daily. 07/30/14  Yes Shanker Kristeen Mans, MD  atorvastatin (LIPITOR) 20 MG tablet Take 20 mg by mouth daily. 07/20/14  Yes Historical Provider, MD  baclofen (LIORESAL) 10 MG tablet Take 10-20 mg by mouth 3 (three) times daily. Takes 10mg  in morning and lunchtime and takes 20mg  at bedtime   Yes Historical Provider, MD  collagenase (SANTYL) ointment Apply 1 application topically daily. 08/30/14  Yes  [name], MD  dexlansoprazole (DEXILANT) 60 MG capsule Take 60 mg by mouth daily.   Yes Historical Provider, MD  diclofenac sodium (VOLTAREN) 1 % GEL Apply 2 g topically daily as needed (for pain).   Yes Historical Provider, MD  feeding supplement, GLUCERNA SHAKE, (GLUCERNA SHAKE) LIQD Take 237 mLs by mouth 3 (three) times daily between meals. 08/24/14  Yes Orson Eva, MD  furosemide (LASIX) 40 MG tablet Take 0.5 tablets (20 mg total) by mouth daily as needed for fluid. 07/30/14  Yes Shanker Kristeen Mans, MD  insulin aspart (NOVOLOG) 100 UNIT/ML injection Inject 5 Units into the skin 3 (three) times daily with meals. 07/21/14  Yes Renato Shin, MD  insulin glargine (LANTUS) 100 UNIT/ML injection Inject 0.26 mLs (26 Units total) into the skin daily. Patient taking differently: Inject 22 Units into the skin daily.  08/24/14  Yes Orson Eva, MD  loratadine (CLARITIN) 10 MG tablet Take 10 mg by mouth daily.    Yes Historical Provider, MD  morphine (MS CONTIN) 30 MG 12 hr tablet Take 1 tablet (30 mg total) by mouth every 12 (twelve) hours. 07/16/14  Yes Meredith Staggers, MD  ondansetron (ZOFRAN) 8 MG tablet Take 8 mg by mouth 2 (two) times daily as needed for nausea or vomiting.  07/07/14  Yes Historical Provider, MD  pregabalin (LYRICA) 100 MG capsule Take 100-200 mg by mouth 4 (four) times daily. Take 100 mg by mouth in the morning, take 100 mg by mouth in the afternoon, take 100 mg by mouth in the evening and take 200 mg by mouth at bedtime.   Yes Historical Provider, MD  silver sulfADIAZINE (SILVADENE) 1 % cream Apply 1 application topically 2 (two) times a week. Tuesday and thursday   Yes Historical Provider, MD  vancomycin 1,250 mg in sodium chloride 0.9 % 250 mL Inject 1,250 mg into the vein daily. Last dose to be given on 09/24/14 08/24/14  Yes Orson Eva, MD  Amino Acids-Protein Hydrolys (FEEDING SUPPLEMENT, PRO-STAT SUGAR FREE 64,) LIQD Take 30 mLs by mouth daily. Patient not taking: Reported on  09/06/2014 08/24/14   Orson Eva, MD  diphenoxylate-atropine (LOMOTIL) 2.5-0.025 MG per tablet Take 1 tablet by mouth 2 (two) times daily. Patient taking differently: Take 1 tablet by mouth 2 (two) times daily as needed for diarrhea or loose stools.  07/21/14   Willia Craze, NP  glucagon (GLUCAGON EMERGENCY) 1 MG injection Inject 1 mg into the vein once as needed. 04/20/14   [name], MD  glucose blood (ACCU-CHEK AVIVA PLUS) test strip Use to check blood sugar 4 times per day 07/21/14   Renato Shin, MD  lidocaine (XYLOCAINE) 2 % jelly Apply 1 application topically 4 (four) times daily as needed (pain).  06/05/14   Historical  Provider, MD  metaxalone (SKELAXIN) 800 MG tablet Take 800 mg by mouth 3 (three) times daily as needed for muscle spasms.    Historical Provider, MD  methocarbamol (ROBAXIN) 750 MG tablet Take 750 mg by mouth 4 (four) times daily as needed. 07/07/14   Historical Provider, MD  metoCLOPramide (REGLAN) 5 MG tablet Take 1 tablet (5 mg total) by mouth 2 (two) times a week. And as needed for nausea 07/01/14   Meredith Staggers, MD  OxyCODONE (OXYCONTIN) 15 mg T12A 12 hr tablet Take 15 mg by mouth every 12 (twelve) hours.    Historical Provider, MD  oxyCODONE-acetaminophen (PERCOCET) 10-325 MG per tablet Take 1 tablet by mouth every 8 (eight) hours as needed for pain. 07/16/14   Meredith Staggers, MD   BP 103/72 mmHg  Pulse 71  Temp(Src) 98.1 F (36.7 C) (Oral)  Resp 22  SpO2 99% Physical Exam  Constitutional: He is oriented to person, place, and time. He appears well-developed and well-nourished. No distress.  HENT:  Head: Atraumatic.  Eyes: Conjunctivae are normal. Pupils are equal, round, and reactive to light. No scleral icterus.  Neck: Neck supple. No tracheal deviation present.  Cardiovascular: Normal rate, regular rhythm, normal heart sounds and intact distal pulses.   Pulmonary/Chest: Effort normal and breath sounds normal. No accessory muscle usage. No respiratory  distress.  Abdominal: Soft. He exhibits no distension. There is no tenderness.  Musculoskeletal: Normal range of motion. He exhibits no edema.  picc line site left antecub area without fb or sign of infection. Decubitus ulcers right hip posteriorly and sacral area without cellulitis or purulent drainage.   Neurological: He is alert and oriented to person, place, and time.  Skin: Skin is warm and dry. He is not diaphoretic.  Psychiatric: He has a normal mood and affect.  Nursing note and vitals reviewed.   ED Course  Procedures (including critical care time) Labs Review  Results for orders placed or performed during the hospital encounter of 09/06/14  Comprehensive metabolic panel  Result Value Ref Range   Sodium 132 (L) 137 - 147 mEq/L   Potassium 4.4 3.7 - 5.3 mEq/L   Chloride 98 96 - 112 mEq/L   CO2 21 19 - 32 mEq/L   Glucose, Bld 103 (H) 70 - 99 mg/dL   BUN 15 6 - 23 mg/dL   Creatinine, Ser 1.07 0.50 - 1.35 mg/dL   Calcium 10.7 (H) 8.4 - 10.5 mg/dL   Total Protein 8.4 (H) 6.0 - 8.3 g/dL   Albumin 2.6 (L) 3.5 - 5.2 g/dL   AST 10 0 - 37 U/L   ALT 8 0 - 53 U/L   Alkaline Phosphatase 90 39 - 117 U/L   Total Bilirubin 0.2 (L) 0.3 - 1.2 mg/dL   GFR calc non Af Amer >90 >90 mL/min   GFR calc Af Amer >90 >90 mL/min   Anion gap 13 5 - 15  CBC with Differential  Result Value Ref Range   WBC 12.1 (H) 4.0 - 10.5 K/uL   RBC 2.94 (L) 4.22 - 5.81 MIL/uL   Hemoglobin 7.7 (L) 13.0 - 17.0 g/dL   HCT 24.1 (L) 39.0 - 52.0 %   MCV 82.0 78.0 - 100.0 fL   MCH 26.2 26.0 - 34.0 pg   MCHC 32.0 30.0 - 36.0 g/dL   RDW 15.3 11.5 - 15.5 %   Platelets 593 (H) 150 - 400 K/uL   Neutrophils Relative % 46 43 - 77 %  Lymphocytes Relative 32 12 - 46 %   Monocytes Relative 16 (H) 3 - 12 %   Eosinophils Relative 5 0 - 5 %   Basophils Relative 1 0 - 1 %   Neutro Abs 5.6 1.7 - 7.7 K/uL   Lymphs Abs 3.9 0.7 - 4.0 K/uL   Monocytes Absolute 1.9 (H) 0.1 - 1.0 K/uL   Eosinophils Absolute 0.6 0.0 - 0.7  K/uL   Basophils Absolute 0.1 0.0 - 0.1 K/uL   WBC Morphology ATYPICAL LYMPHOCYTES   Vancomycin, trough  Result Value Ref Range   Vancomycin Tr 25.2 (HH) 10.0 - 20.0 ug/mL  Sedimentation rate  Result Value Ref Range   Sed Rate 119 (H) 0 - 16 mm/hr    MDM  pts family member indicates labs due to today, has requisition sheet - will order in ED.  Reviewed nursing notes and prior charts for additional history.   picc line to be placed/iv team.  Ca++sl high.  Iv ns bolus.   Recheck alert, content, no new c/o.   Awaiting picc line.  Will provide copy of labs.  picc team to place new picc line.  todays iv dose vanc per pharmacy consult.  Recheck alert, content, no new c/o. No pain. No fever or chills. Feels at baseline. No distress.  Will plan d/c to home post ivf, iv abx, w plan for close outpt follow up.       Mirna Mires, MD 09/06/14 [phone]

## 2014-09-06 NOTE — ED Notes (Signed)
Patient states picc line became dislodged this am, patient has picc for antibiotics due to recent infection, site clean with no bleeding at time of arrival

## 2014-09-06 NOTE — ED Notes (Signed)
Patient PICC line not present at time of arrival, patient states it became dislodged this am while he was getting bath, no bleeding and site benign at time of arrival

## 2014-09-06 NOTE — Discharge Instructions (Signed)
It was our pleasure to provide your ER care today - we hope that you feel better.  Continue your antibiotics.  Follow up with your primary care provider in the next couple days for recheck.  Return to ER if worse, new symptoms, fevers, trouble breathing, change in mental status, worsening appearance of wounds, problems w picc line, other concern.       PICC Home Guide   A peripherally inserted central catheter (PICC) is a long, thin, flexible tube that is inserted into a vein in the upper arm. It is a form of intravenous (IV) access. It is considered to be a "central" line because the tip of the PICC ends in a large vein in your chest. This large vein is called the superior vena cava (SVC). The PICC tip ends in the SVC because there is a lot of blood flow in the SVC. This allows medicines and IV fluids to be quickly distributed throughout the body. The PICC is inserted using a sterile technique by a specially trained nurse or physician. After the PICC is inserted, a chest X-ray exam is done to be sure it is in the correct place.  A PICC may be placed for different reasons, such as:  To give medicines and liquid nutrition that can only be given through a central line. Examples are:  Certain antibiotic treatments.  Chemotherapy.  Total parenteral nutrition (TPN).  To take frequent blood samples.  To give IV fluids and blood products.  If there is difficulty placing a peripheral intravenous (PIV) catheter. If taken care of properly, a PICC can remain in place for several months. A PICC can also allow a person to go home from the hospital early. Medicine and PICC care can be managed at home by a family member or home health care team. [place] A PICC? Problems with a PICC can occasionally occur. These may include the following:  A blood clot (thrombus) forming in or at the tip of the PICC. This can cause the PICC to become clogged. A clot-dissolving  medicine called tissue plasminogen activator (tPA) can be given through the PICC to help break up the clot.  Inflammation of the vein (phlebitis) in which the PICC is placed. Signs of inflammation may include redness, pain at the insertion site, red streaks, or being able to feel a "cord" in the vein where the PICC is located.  Infection in the PICC or at the insertion site. Signs of infection may include fever, chills, redness, swelling, or pus drainage from the PICC insertion site.  PICC movement (malposition). The PICC tip may move from its original position due to excessive physical activity, forceful coughing, sneezing, or vomiting.  A break or cut in the PICC. It is important to not use scissors near the PICC.  Nerve or tendon irritation or injury during PICC insertion. WHAT SHOULD I KEEP IN MIND ABOUT ACTIVITIES WHEN I HAVE A PICC?  You may bend your arm and move it freely. If your PICC is near or at the bend of your elbow, avoid activity with repeated motion at the elbow.  Rest at home for the remainder of the day following PICC line insertion.  Avoid lifting heavy objects as instructed by your health care provider.  Avoid using a crutch with the arm on the same side as your PICC. You may need to use a walker. WHAT SHOULD I KNOW ABOUT MY PICC DRESSING?  Keep your PICC bandage (dressing) clean and  dry to prevent infection.  Ask your health care provider when you may shower. Ask your health care provider to teach you how to wrap the PICC when you do take a shower.  Change the PICC dressing as instructed by your health care provider.  Change your PICC dressing if it becomes loose or wet. WHAT SHOULD I KNOW ABOUT PICC CARE?  Check the PICC insertion site daily for leakage, redness, swelling, or pain.  Do not take a bath, swim, or use hot tubs when you have a PICC. Cover PICC line with clear plastic wrap and tape to keep it dry while showering.  Flush the PICC as directed by  your health care provider. Let your health care provider know right away if the PICC is difficult to flush or does not flush. Do not use force to flush the PICC.  Do not use a syringe that is less than 10 mL to flush the PICC.  Never pull or tug on the PICC.  Avoid blood pressure checks on the arm with the PICC.  Keep your PICC identification card with you at all times.  Do not take the PICC out yourself. Only a trained clinical professional should remove the PICC. SEEK IMMEDIATE MEDICAL CARE IF:  Your PICC is accidentally pulled all the way out. If this happens, cover the insertion site with a bandage or gauze dressing. Do not throw the PICC away. Your health care provider will need to inspect it.  Your PICC was tugged or pulled and has partially come out. Do not  push the PICC back in.  There is any type of drainage, redness, or swelling where the PICC enters the skin.  You cannot flush the PICC, it is difficult to flush, or the PICC leaks around the insertion site when it is flushed.  You hear a "flushing" sound when the PICC is flushed.  You have pain, discomfort, or numbness in your arm, shoulder, or jaw on the same side as the PICC.  You feel your heart "racing" or skipping beats.  You notice a hole or tear in the PICC.  You develop chills or a fever. MAKE SURE YOU:   Understand these instructions.  Will watch your condition.  Will get help right away if you are not doing well or get worse. Document Released: 03/31/2003 Document Revised: 02/08/2014 Document Reviewed: 06/01/2013 [employer] Patient Information 2015 [place], [place]. This information is not intended to replace advice given to you by your health care provider. Make sure you discuss any questions you have with your health care provider.

## 2014-09-06 NOTE — ED Notes (Signed)
Danella Sensing, NP from pain management clinic speaking to Dr.Ray via telephone concerning mother's request for pain medication. Pt's mother concerned that pt is out of pain medications until there followup appointment with PCP.

## 2014-09-06 NOTE — Telephone Encounter (Signed)
Just an FYI, the reason he was without pain meds for 2 months was because they took him off in hospital because he was overmedicated. (see 08/23/14 telephone call)

## 2014-09-06 NOTE — Progress Notes (Signed)
ANTIBIOTIC CONSULT NOTE - INITIAL  Pharmacy Consult for Vancomycin Indication: osteomyelitis  Allergies  Allergen Reactions  . Cefuroxime Axetil Anaphylaxis  . Penicillins Anaphylaxis and Other (See Comments)    ?can take amoxicillin?  [name] [Benzonatate] Anaphylaxis  . Shellfish Allergy Itching and Other (See Comments)    Took benadryl to alleviate reaction    Patient Measurements:  Vital Signs: Temp: 98.1 F (36.7 C) (11/30 1200) Temp Source: Oral (11/30 1200) BP: 111/77 mmHg (11/30 1600) Pulse Rate: 69 (11/30 1600) Intake/Output from previous day:   Intake/Output from this shift:    Labs:  Recent Labs  09/06/14 1305  WBC 12.1*  HGB 7.7*  PLT 593*  CREATININE 1.07   CrCl cannot be calculated (Unknown ideal weight.).  Recent Labs  09/06/14 1305  VANCOTROUGH 25.2*     Microbiology: Recent Results (from the past 720 hour(s))  Blood Culture (routine x 2)     Status: None   Collection Time: 08/12/14  6:40 PM  Result Value Ref Range Status   Specimen Description BLOOD RIGHT [facility]  Final   Special Requests BOTTLES DRAWN AEROBIC AND ANAEROBIC 5CC EACH  Final   Culture  Setup Time   Final    08/13/2014 01:00 Performed at [facility]    Culture   Final    STAPHYLOCOCCUS AUREUS Note: SUSCEPTIBILITIES PERFORMED ON PREVIOUS CULTURE WITHIN THE LAST 5 DAYS. Note: Gram Stain Report Called to,Read Back By and Verified With: [facility] WORK RN [phone] Performed at [facility]    Report Status 08/16/2014 FINAL  Final  Urine culture     Status: None   Collection Time: 08/12/14  6:40 PM  Result Value Ref Range Status   Specimen Description URINE, CATHETERIZED  Final   Special Requests NONE  Final   Culture  Setup Time   Final    08/13/2014 00:43 Performed at [place]   Final    >=100,000 COLONIES/ML Performed at [facility]    Culture   Final    KLEBSIELLA PNEUMONIAE Performed at [facility]     Report Status 08/16/2014 FINAL  Final   Organism ID, Bacteria KLEBSIELLA PNEUMONIAE  Final      Susceptibility   Klebsiella pneumoniae - MIC*    AMPICILLIN >=32 RESISTANT Resistant     CEFAZOLIN <=4 SENSITIVE Sensitive     CEFTRIAXONE <=1 SENSITIVE Sensitive     CIPROFLOXACIN 0.5 SENSITIVE Sensitive     GENTAMICIN <=1 SENSITIVE Sensitive     LEVOFLOXACIN 2 SENSITIVE Sensitive     NITROFURANTOIN 64 INTERMEDIATE Intermediate     TOBRAMYCIN <=1 SENSITIVE Sensitive     TRIMETH/SULFA <=20 SENSITIVE Sensitive     PIP/TAZO 32 INTERMEDIATE Intermediate     * KLEBSIELLA PNEUMONIAE  Blood Culture (routine x 2)     Status: None   Collection Time: 08/12/14  6:48 PM  Result Value Ref Range Status   Specimen Description BLOOD RIGHT W[facility]ST  Final   Special Requests BOTTLES DRAWN AEROBIC AND ANAEROBIC 10CC EACH  Final   Culture  Setup Time   Final    08/13/2014 00:59 Performed at [facility]    Culture   Final    STAPHYLOCOCCUS AUREUS Note: RIFAMPIN AND GENTAMICIN SHOULD NOT BE USED AS SINGLE DRUGS FOR TREATMENT OF STAPH INFECTIONS. ENTEROCOCCUS SPECIES Note: COMBINATION THERAPY OF HIGH DOSE AMPICILLIN OR VANCOMYCIN, PLUS AN AMINOGLYCOSIDE, IS USUALLY INDICATED FOR SERIOUS ENTEROCOCCAL INFECTIONS. Note: Gram Stain Report Called to,Read  Back By and Verified With: Paula Compton RN 700P    Report Status 08/16/2014 FINAL  Final   Organism ID, Bacteria STAPHYLOCOCCUS AUREUS  Final   Organism ID, Bacteria ENTEROCOCCUS SPECIES  Final      Susceptibility   Staphylococcus aureus - MIC*    CLINDAMYCIN >=8 RESISTANT Resistant     ERYTHROMYCIN >=8 RESISTANT Resistant     GENTAMICIN <=0.5 SENSITIVE Sensitive     LEVOFLOXACIN 0.25 SENSITIVE Sensitive     OXACILLIN 1 SENSITIVE Sensitive     PENICILLIN >=0.5 RESISTANT Resistant     RIFAMPIN <=0.5 SENSITIVE Sensitive     TRIMETH/SULFA 20 SENSITIVE Sensitive     VANCOMYCIN <=0.5 SENSITIVE Sensitive     TETRACYCLINE >=16 RESISTANT Resistant      MOXIFLOXACIN* <=0.25 SENSITIVE Sensitive      * SET UP TIME:  [id number]    * STAPHYLOCOCCUS AUREUS   Enterococcus species - MIC*    AMPICILLIN <=2 SENSITIVE Sensitive     VANCOMYCIN 1 SENSITIVE Sensitive     * ENTEROCOCCUS SPECIES  MRSA PCR Screening     Status: None   Collection Time: 08/13/14 12:43 AM  Result Value Ref Range Status   MRSA by PCR NEGATIVE NEGATIVE Final    Comment:        The GeneXpert MRSA Assay (FDA approved for NASAL specimens only), is one component of a comprehensive MRSA colonization surveillance program. It is not intended to diagnose MRSA infection nor to guide or monitor treatment for MRSA infections.   Culture, respiratory (NON-Expectorated)     Status: None   Collection Time: 08/13/14 11:52 AM  Result Value Ref Range Status   Specimen Description TRACHEAL ASPIRATE  Final   Special Requests NONE  Final   Gram Stain   Final    FEW WBC PRESENT,BOTH PMN AND MONONUCLEAR RARE SQUAMOUS EPITHELIAL CELLS PRESENT FEW GRAM POSITIVE COCCI IN PAIRS IN CLUSTERS RARE GRAM NEGATIVE RODS Performed at [facility]    Culture   Final    ABUNDANT STAPHYLOCOCCUS AUREUS Note: RIFAMPIN AND GENTAMICIN SHOULD NOT BE USED AS SINGLE DRUGS FOR TREATMENT OF STAPH INFECTIONS. Performed at [facility]    Report Status 08/16/2014 FINAL  Final   Organism ID, Bacteria STAPHYLOCOCCUS AUREUS  Final      Susceptibility   Staphylococcus aureus - MIC*    CLINDAMYCIN >=8 RESISTANT Resistant     ERYTHROMYCIN >=8 RESISTANT Resistant     GENTAMICIN <=0.5 SENSITIVE Sensitive     LEVOFLOXACIN 0.25 SENSITIVE Sensitive     OXACILLIN 1 SENSITIVE Sensitive     PENICILLIN >=0.5 RESISTANT Resistant     RIFAMPIN <=0.5 SENSITIVE Sensitive     TRIMETH/SULFA <=10 SENSITIVE Sensitive     VANCOMYCIN 1 SENSITIVE Sensitive     TETRACYCLINE >=16 RESISTANT Resistant     MOXIFLOXACIN <=0.25 SENSITIVE Sensitive     * ABUNDANT STAPHYLOCOCCUS AUREUS  Culture, blood (routine x  2)     Status: None   Collection Time: 08/14/14  3:20 PM  Result Value Ref Range Status   Specimen Description BLOOD RIGHT ARM  Final   Special Requests BOTTLES DRAWN AEROBIC ONLY [place]  Final   Culture  Setup Time   Final    08/15/2014 00:57 Performed at [place]   Final    NO GROWTH 5 DAYS Performed at [facility]    Report Status 08/21/2014 FINAL  Final  Culture, blood (routine x 2)  Status: None   Collection Time: 08/14/14  3:30 PM  Result Value Ref Range Status   Specimen Description BLOOD RIGHT HAND  Final   Special Requests BOTTLES DRAWN AEROBIC ONLY 4CC  Final   Culture  Setup Time   Final    08/15/2014 00:57 Performed at [place]   Final    NO GROWTH 5 DAYS Performed at [facility]    Report Status 08/21/2014 FINAL  Final  Clostridium Difficile by PCR     Status: None   Collection Time: 08/15/14  2:12 PM  Result Value Ref Range Status   C difficile by pcr NEGATIVE NEGATIVE Final  Clostridium Difficile by PCR     Status: None   Collection Time: 08/19/14  1:14 PM  Result Value Ref Range Status   C difficile by pcr NEGATIVE NEGATIVE Final  Urine culture     Status: None   Collection Time: 08/19/14  4:46 PM  Result Value Ref Range Status   Specimen Description URINE, CATHETERIZED  Final   Special Requests NONE  Final   Culture  Setup Time   Final    08/19/2014 22:41 Performed at [place] Performed at [facility]   Final   Culture NO GROWTH Performed at [facility]   Final   Report Status 08/20/2014 FINAL  Final  Culture, blood (routine x 2)     Status: None   Collection Time: 08/22/14  7:40 PM  Result Value Ref Range Status   Specimen Description BLOOD RIGHT HAND  Final   Special Requests BOTTLES DRAWN AEROBIC AND ANAEROBIC 5CC EA  Final   Culture  Setup Time   Final    08/23/2014 03:32 Performed at [facility]    Culture    Final    NO GROWTH 5 DAYS Performed at [facility]    Report Status 08/29/2014 FINAL  Final  Culture, blood (routine x 2)     Status: None   Collection Time: 08/22/14  8:40 PM  Result Value Ref Range Status   Specimen Description BLOOD RIGHT ARM  Final   Special Requests   Final    BOTTLES DRAWN AEROBIC AND ANAEROBIC 10CC BLUE AND 6CC PURPLE   Culture  Setup Time   Final    08/23/2014 10:05 Performed at [place]   Final    NO GROWTH 5 DAYS Performed at [facility]    Report Status 08/29/2014 FINAL  Final    Medical History: Past Medical History  Diagnosis Date  . GERD (gastroesophageal reflux disease)   . Asthma   . Hx MRSA infection     on face  . Gastroparesis   . Diabetic neuropathy   . Seizures   . Stroke     spinal stroke in 4/15  . Diabetes mellitus     sees Dr. Loanne Drilling   . Family history of anesthesia complication     Pt mother can't have epidural procedures  . Dysrhythmia   . Pneumonia   . Arthritis   . Fibromyalgia     Medications:  Scheduled:  . sodium chloride  10-40 mL Intracatheter Q12H   Assessment: 28 yo m who presented to the ED on 11/30 for a PICC line replacement.  Patient has a h/o spinal cord injury, quadriplegia and osteomyelitis on home IV antibiotics for 4 weeks.  Patient is on vancomycin  1,250 mg IV q24hrs at home, last dose 11/29 ~0900. He has not received his dose today.  A vancomycin trough was obtained in the ED and found to be supratherapeutic at 25.2 (trough drawn ~4 hours late).  Will hold today's dose and change regular dose to 750 mg IV q24hrs.  Home health [name]) was notified and the patient's information was updated and dose was changed to reflect 750 mg IV q24hrs. Patient was instructed not to give himself a dose tonight and wait for the new medication to be delivered tomorrow morning.    Goal of Therapy:  Vancomycin trough level 15-20 mcg/ml  Plan:  Hold today's vancomycin  dose Change home dose to 750 mg IV q24hrs Obtain vancomycin trough at [facility] (~4th dose) Monitor CBC, renal function, clinical course  Hao Dion L. Nicole Kindred, PharmD Clinical Pharmacy Resident Pager: [phone] 09/06/2014 5:16 PM

## 2014-09-06 NOTE — Telephone Encounter (Signed)
Patient is to follow up for [facility] at [facility] on 12/16.  HHRN Olin Hauser called to let us know she is sending patient to ED for PICC evaluation/replacement per protocol.  When she arrived at the house today, it had been pulled out "almost to his elbow."  RN states patient and his mother had difficulty getting the iv antibiotics to flow properly this weekend. [name], RN

## 2014-09-07 ENCOUNTER — Telehealth: Payer: Self-pay | Admitting: *Deleted

## 2014-09-07 ENCOUNTER — Ambulatory Visit: Payer: Medicaid Other | Admitting: Family Medicine

## 2014-09-07 NOTE — Telephone Encounter (Signed)
I called and spoke with Kacen's mother Treasa School about the request for medication that was called to our NP Danella Sensing last pm after hours.  Mrs Bromwell was claiming Akira had no pain medication.  NCCSR showed he filled MS Contin 30 mg #60 on 08/12/14.He should not be out of this medication until 09/11/14  If he had been hospitalized for 2 weeks he should have even more meds. She first said that he had no medication and when I brought the hospitalization up and should have medication she said she misspoke and he did have medication  # 28 pills that she had tucked back while he was in hospital.  But, she said [place] does not like to take the morphine.  I told her that he must take it and it is scheduled bid because that is what Dr Naaman Plummer had prescribed for him.  I told her that we have scheduled him an appt for 11:00 Friday with Dr Naaman Plummer and it can be discussed at that time.  We are not going to write a new prescription at this time.  She is in agreement with the plan.

## 2014-09-07 NOTE — Telephone Encounter (Signed)
Received a phone call from Mrs. Jason [name] regarding her son Jason [name] at 6:00 pm on 09/06/2014. She stated [name] was out of his medication. He was in the emergency room for new PICC line placement. I spoke to the Doctor on call she didn't want to medicate him, she stated [name] was already discharged an awaiting EMS for transportation. I reviewed the record in [facility] 09/06/2014,  she was instructed to call the office in the morning. After reviewing [facility] Controlled Substance Reporting System on 09/07/14. The staff placed a call to Jason [name] and she sated she mis spoken he has MS Contin. He will be seen in the office on 09/10/14 for follow up with Dr. Naaman Plummer

## 2014-09-10 ENCOUNTER — Encounter: Payer: Self-pay | Admitting: Physical Medicine & Rehabilitation

## 2014-09-10 ENCOUNTER — Encounter: Payer: Medicaid Other | Attending: Registered Nurse | Admitting: Physical Medicine & Rehabilitation

## 2014-09-10 ENCOUNTER — Telehealth: Payer: Self-pay | Admitting: Family Medicine

## 2014-09-10 VITALS — BP 89/57 | HR 83 | Resp 14

## 2014-09-10 DIAGNOSIS — G8250 Quadriplegia, unspecified: Secondary | ICD-10-CM

## 2014-09-10 DIAGNOSIS — G9519 Other vascular myelopathies: Secondary | ICD-10-CM | POA: Insufficient documentation

## 2014-09-10 DIAGNOSIS — N319 Neuromuscular dysfunction of bladder, unspecified: Secondary | ICD-10-CM

## 2014-09-10 DIAGNOSIS — Z5181 Encounter for therapeutic drug level monitoring: Secondary | ICD-10-CM | POA: Diagnosis present

## 2014-09-10 DIAGNOSIS — Z79899 Other long term (current) drug therapy: Secondary | ICD-10-CM | POA: Diagnosis present

## 2014-09-10 DIAGNOSIS — N390 Urinary tract infection, site not specified: Secondary | ICD-10-CM | POA: Diagnosis present

## 2014-09-10 DIAGNOSIS — IMO0002 Reserved for concepts with insufficient information to code with codable children: Secondary | ICD-10-CM

## 2014-09-10 DIAGNOSIS — E1140 Type 2 diabetes mellitus with diabetic neuropathy, unspecified: Secondary | ICD-10-CM | POA: Insufficient documentation

## 2014-09-10 DIAGNOSIS — E1065 Type 1 diabetes mellitus with hyperglycemia: Secondary | ICD-10-CM

## 2014-09-10 DIAGNOSIS — E1041 Type 1 diabetes mellitus with diabetic mononeuropathy: Secondary | ICD-10-CM | POA: Diagnosis present

## 2014-09-10 DIAGNOSIS — E1040 Type 1 diabetes mellitus with diabetic neuropathy, unspecified: Secondary | ICD-10-CM

## 2014-09-10 DIAGNOSIS — G9511 Acute infarction of spinal cord (embolic) (nonembolic): Secondary | ICD-10-CM

## 2014-09-10 DIAGNOSIS — L89159 Pressure ulcer of sacral region, unspecified stage: Secondary | ICD-10-CM

## 2014-09-10 MED ORDER — OXYCODONE HCL 15 MG PO TABS: 15.0000 mg | ORAL_TABLET | Freq: Four times a day (QID) | ORAL | Status: DC | PRN

## 2014-09-10 NOTE — Telephone Encounter (Signed)
Please see what they need  ?

## 2014-09-10 NOTE — Telephone Encounter (Signed)
Olin Hauser w/ [facility] would like you to call her about some wound care orders for pt.

## 2014-09-10 NOTE — Patient Instructions (Addendum)
YOU NEED TO KEEP YOUR SKIN MOISTENED (DO NOT APPLY LOTION AROUND OPEN WOUNDS HOWEVER)  CONTINUE TO WORK ON YOUR DIABETES CONTROL  YOU MAY TAKE UP TO 2500MG  OF TYLENOL PER DAY

## 2014-09-10 NOTE — Progress Notes (Signed)
Subjective:    Patient ID: Jason [name], male    DOB: 1986/07/18, 28 y.o.   MRN: [id number]  HPI Jason [name] is back regarding his SCI and chronic associated pain. He was admitted to the hospital last month for a worsening sacral wound which broke down and he became septic. He was at [facility] for about 2 weeks. He continues to have wound issues which are being followed by home health. He continues on iv abx.  He is afebrile and starting to feel better. Mom states that sugars are better but they are still peaking above 300.   He came off the ms contin prior to admission to the hospital and really wasn't taking it consistently anyway. He felt that the percocet seemed to work better for him. He continues on his other medications as presribed.   Pain Inventory Average Pain 8 Pain Right Now 7 My pain is constant and aching  In the last 24 hours, has pain interfered with the following? General activity 6 Relation with others 7 Enjoyment of life 8 What TIME of day is your pain at its worst? evening Sleep (in general) Poor  Pain is worse with: sitting, inactivity and laying down Pain improves with: therapy/exercise and medication Relief from Meds: depends  Mobility ability to climb steps?  no do you drive?  no use a wheelchair needs help with transfers Do you have any goals in this area?  yes  Function disabled: date disabled 01/29/2014 I need assistance with the following:  feeding, dressing, bathing, toileting, meal prep, household duties and shopping  Neuro/Psych bladder control problems bowel control problems weakness numbness tremor tingling spasms anxiety  Prior Studies Any changes since last visit?  yes x-rays CT/MRI  Physicians involved in your care Any changes since last visit?  no   Family History  Problem Relation Age of Onset  . Diabetes Father   . Hypertension Father   . Asthma      fhx  . Hypertension      fhx  . Stroke      fhx  . Heart disease  Mother    History   Social History  . Marital Status: Single    Spouse Name: N/A    Number of Children: 0  . Years of Education: 11   Occupational History  . Disability    Social History Main Topics  . Smoking status: Current Some Day Smoker    Types: Cigars, Cigarettes    Last Attempt to Quit: 01/29/2014  . Smokeless tobacco: Never Used     Comment: e-cigarette and cigars currently  . Alcohol Use: No     Comment: rarely  . Drug Use: No  . Sexual Activity: None   Other Topics Concern  . None   Social History Narrative   Patient lives at home with mother father.    Patient has 2 children.    Patient is single.    Patient was left hand but after stroke patient is now right handed.    Patient has  11th grade education.    Past Surgical History  Procedure Laterality Date  . Tonsillectomy    . Multiple extractions with alveoloplasty N/A 08/03/2014    Procedure: MULTIPLE EXTRACTIONS;  Surgeon: Gae Bon, DDS;  Location: [place];  Service: Oral Surgery;  Laterality: N/A;  . Tee without cardioversion N/A 08/17/2014    Procedure: TRANSESOPHAGEAL ECHOCARDIOGRAM (TEE);  Surgeon: Dorothy Spark, MD;  Location: [place];  Service: Cardiovascular;  Laterality:  N/A;   Past Medical History  Diagnosis Date  . GERD (gastroesophageal reflux disease)   . Asthma   . Hx MRSA infection     on face  . Gastroparesis   . Diabetic neuropathy   . Seizures   . Stroke     spinal stroke in 4/15  . Diabetes mellitus     sees Dr. Loanne Drilling   . Family history of anesthesia complication     Pt mother can't have epidural procedures  . Dysrhythmia   . Pneumonia   . Arthritis   . Fibromyalgia    BP 89/57 mmHg  Pulse 83  Resp 14  SpO2 98%  Opioid Risk Score:   Fall Risk Score: Moderate Fall Risk (6-13 points) (pt. has received pamphlet following release from hospital)  Review of Systems  Constitutional: Positive for appetite change and unexpected weight change.       Poor  appetite Weight loss  Gastrointestinal:       Bowel control problems  Genitourinary:       Bladder control problems  Neurological: Positive for tremors, weakness and numbness.       Spasms tingling  All other systems reviewed and are negative.      Objective:   Physical Exam  HEENT: normal  Cardio: RRR and no murmur  Resp: CTA B/L and unlabored  GI: BS positive and mildly distended and tympanitic, mild tenderness  Extremity: No Edema  Skin: wounds dressed. He has eschar over the knuckles of the left hand Neuro: Alert/Oriented, Abnormal Sensory absent in feet and Abnormal Motor 0/5 in LLE RLE: tr to 1/5 at knee. ,tr 1/5 finger extension and tr hand intrinsic bilaterally. Wrist extension 4+ to 5/5 right and 3- on left, 4/5 tricep right, 2+ left, biceps 5/5. Decreased sensation below elbows bilaterally, minimal pain/LT at left hand. Poor trunk control. Posture   poor.  Musc/Skel: Generalized upper thoracic and lumbar spine tenderness. He sits with poor/slumped posture in his w/c. His seat belt for w/c is about 6 inches too small (at least!).  Gen NAD. He has gained weight.  Psych: generally alert, appropriate  GU: foley in place.    Assessment/Plan:  1. 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 Chronic Pain Management: d  -  baclofen for muscle spasms---ulimately will titrate to 10mg  bid and 20mg  qhs  -lyrica 100mg  tid and 200mg  qhs  -dc ms contin  -will change him to oxycodone 15mg  q6 prn for breakthrough pain, one q6 prn #120  -he may take up to 2500mg  of tylenol per day 7. DM type 2 with gastroparesis: continues to be brittle---follow up per endocrine/pcp   -needs better control 8. Chronic wounds: home health continues to follow open  areas  -iv abx per ID  -eucerin cream to dry, intact areas.  9. Constipation/diabetic gastroparesis/neurogenic bowel: reglan. 10. Severe diabetic peripheral neuropathy: lyrica  11. Pulmonary: deep breathing, IS/FV, CPAP---was reinforced today.  -quad cough, assistive techniques  13. Power wheelchair: needs a reclining back chair with better lumbar and thoracic support, elevating leg rest and appropriate sized seat belt.  14. Neurogenic bladder: foley  I will see him back in one month for follow up. Thirty minutes of face to face patient care time were spent during this visit. All questions were  encouraged and answered.

## 2014-09-10 NOTE — Telephone Encounter (Signed)
Attempted to call number give but it is "not a working number".

## 2014-09-13 NOTE — Telephone Encounter (Signed)
Per Dr. Sarajane Jews, no do not use this cream, it will prolong healing. I spoke with Olin Hauser and she will give mom the message.

## 2014-09-13 NOTE — Telephone Encounter (Signed)
I spoke with Jason [name] and pt's mom would like to know if she can use Triamcinolone cream for blisters on pt's feet? She had a big tube of this and thought that it might help with his feet.

## 2014-09-13 NOTE — Telephone Encounter (Signed)
I left a voice message for Jason [name] at [phone] to return my call.

## 2014-09-15 ENCOUNTER — Ambulatory Visit (INDEPENDENT_AMBULATORY_CARE_PROVIDER_SITE_OTHER): Payer: Medicaid Other | Admitting: Family Medicine

## 2014-09-15 ENCOUNTER — Encounter: Payer: Self-pay | Admitting: Family Medicine

## 2014-09-15 VITALS — BP 96/60 | Temp 99.9°F

## 2014-09-15 DIAGNOSIS — L8994 Pressure ulcer of unspecified site, stage 4: Secondary | ICD-10-CM

## 2014-09-15 DIAGNOSIS — N319 Neuromuscular dysfunction of bladder, unspecified: Secondary | ICD-10-CM

## 2014-09-15 DIAGNOSIS — K089 Disorder of teeth and supporting structures, unspecified: Secondary | ICD-10-CM

## 2014-09-15 DIAGNOSIS — E1065 Type 1 diabetes mellitus with hyperglycemia: Secondary | ICD-10-CM

## 2014-09-15 DIAGNOSIS — N390 Urinary tract infection, site not specified: Secondary | ICD-10-CM

## 2014-09-15 MED ORDER — FLUCONAZOLE 200 MG PO TABS: 200.0000 mg | ORAL_TABLET | Freq: Every day | ORAL | Status: DC

## 2014-09-15 MED ORDER — BISACODYL 5 MG PO TBEC: 5.0000 mg | DELAYED_RELEASE_TABLET | Freq: Two times a day (BID) | ORAL | Status: DC | PRN

## 2014-09-15 MED ORDER — POLYETHYLENE GLYCOL 3350 17 GM/SCOOP PO POWD: 17.0000 g | Freq: Every day | ORAL | Status: DC

## 2014-09-15 NOTE — Progress Notes (Signed)
   Subjective:    Patient ID: Jason [name], male    DOB: October 11, 1985, 28 y.o.   MRN: [id number]  HPI Also noted per his mother is the fact that he has aneurogenic bladder with chronic urinary incontinence. He has an indwelling catheter that has to be changed twice a month to prevent him getting UTIs. Because he is immobile and confined to a wheelchair, he is at very high risk of getting UTIs from the catheter. Therefore these must be replaced frequently.    Review of Systems     Objective:   Physical Exam        Assessment & Plan:

## 2014-09-15 NOTE — Progress Notes (Signed)
   Subjective:    Patient ID: Jason [name], male    DOB: 09-Apr-1986, 28 y.o.   MRN: [id number]  HPI Here to follow up after a hospital stay in November for sepsis from a sacral pressure wound. He has been followed by Dr. Tommy Medal and he has been receiving IV Vancomycin daily. He is projected to get his last dose on 09-24-14 and he will see Dr. Tommy Medal again on 09-22-14. However he has continued to have fevers, and he was up to 101 degrees this am. He has poor oral food intake and part of this is due to very bad dental disease. His mother wants [name] to refer him to the [facility] program for this. He sees Dr. Loanne Drilling for his diabetes and he see Dr. Tamala Julian for rehab care and pain management.    Review of Systems  Constitutional: Negative.   Respiratory: Negative.   Cardiovascular: Negative.   Gastrointestinal: Negative.   Skin: Positive for wound.       Objective:   Physical Exam  HENT:  He has severe dental disease with many broken off teeth  Cardiovascular: Normal rate, regular rhythm, normal heart sounds and intact distal pulses.   Pulmonary/Chest: Effort normal and breath sounds normal.          Assessment & Plan:  We will refer him to [facility]. Stay on daily Diflucan to control urinary yeast infections. He will follow up with ID about the wound infections.

## 2014-09-15 NOTE — Progress Notes (Signed)
Pre visit review using our clinic review tool, if applicable. No additional management support is needed unless otherwise documented below in the visit note. 

## 2014-09-16 ENCOUNTER — Inpatient Hospital Stay (HOSPITAL_COMMUNITY)
Admission: EM | Admit: 2014-09-16 | Discharge: 2014-09-24 | DRG: 871 | Disposition: A | Discharge status: Home Health | Payer: Medicaid Other | Attending: Pulmonary Disease | Admitting: Pulmonary Disease

## 2014-09-16 ENCOUNTER — Emergency Department (HOSPITAL_COMMUNITY): Payer: Medicaid Other

## 2014-09-16 ENCOUNTER — Inpatient Hospital Stay (HOSPITAL_COMMUNITY): Payer: Medicaid Other

## 2014-09-16 ENCOUNTER — Encounter (HOSPITAL_COMMUNITY): Payer: Self-pay | Admitting: *Deleted

## 2014-09-16 DIAGNOSIS — R569 Unspecified convulsions: Secondary | ICD-10-CM | POA: Diagnosis present

## 2014-09-16 DIAGNOSIS — E08621 Diabetes mellitus due to underlying condition with foot ulcer: Secondary | ICD-10-CM

## 2014-09-16 DIAGNOSIS — E119 Type 2 diabetes mellitus without complications: Secondary | ICD-10-CM

## 2014-09-16 DIAGNOSIS — E162 Hypoglycemia, unspecified: Secondary | ICD-10-CM

## 2014-09-16 DIAGNOSIS — M797 Fibromyalgia: Secondary | ICD-10-CM | POA: Diagnosis present

## 2014-09-16 DIAGNOSIS — E1165 Type 2 diabetes mellitus with hyperglycemia: Secondary | ICD-10-CM | POA: Diagnosis present

## 2014-09-16 DIAGNOSIS — K3184 Gastroparesis: Secondary | ICD-10-CM | POA: Diagnosis present

## 2014-09-16 DIAGNOSIS — Z794 Long term (current) use of insulin: Secondary | ICD-10-CM

## 2014-09-16 DIAGNOSIS — E876 Hypokalemia: Secondary | ICD-10-CM | POA: Diagnosis present

## 2014-09-16 DIAGNOSIS — R7401 Elevation of levels of liver transaminase levels: Secondary | ICD-10-CM

## 2014-09-16 DIAGNOSIS — N390 Urinary tract infection, site not specified: Secondary | ICD-10-CM | POA: Diagnosis present

## 2014-09-16 DIAGNOSIS — G8220 Paraplegia, unspecified: Secondary | ICD-10-CM | POA: Diagnosis present

## 2014-09-16 DIAGNOSIS — N179 Acute kidney failure, unspecified: Secondary | ICD-10-CM | POA: Diagnosis present

## 2014-09-16 DIAGNOSIS — D649 Anemia, unspecified: Secondary | ICD-10-CM | POA: Diagnosis present

## 2014-09-16 DIAGNOSIS — I639 Cerebral infarction, unspecified: Secondary | ICD-10-CM | POA: Diagnosis present

## 2014-09-16 DIAGNOSIS — K7200 Acute and subacute hepatic failure without coma: Secondary | ICD-10-CM | POA: Diagnosis present

## 2014-09-16 DIAGNOSIS — J9601 Acute respiratory failure with hypoxia: Secondary | ICD-10-CM | POA: Diagnosis present

## 2014-09-16 DIAGNOSIS — J45909 Unspecified asthma, uncomplicated: Secondary | ICD-10-CM | POA: Diagnosis present

## 2014-09-16 DIAGNOSIS — N189 Chronic kidney disease, unspecified: Secondary | ICD-10-CM

## 2014-09-16 DIAGNOSIS — I10 Essential (primary) hypertension: Secondary | ICD-10-CM | POA: Diagnosis present

## 2014-09-16 DIAGNOSIS — J9602 Acute respiratory failure with hypercapnia: Secondary | ICD-10-CM

## 2014-09-16 DIAGNOSIS — N319 Neuromuscular dysfunction of bladder, unspecified: Secondary | ICD-10-CM

## 2014-09-16 DIAGNOSIS — L89322 Pressure ulcer of left buttock, stage 2: Secondary | ICD-10-CM | POA: Diagnosis present

## 2014-09-16 DIAGNOSIS — L89214 Pressure ulcer of right hip, stage 4: Secondary | ICD-10-CM | POA: Diagnosis present

## 2014-09-16 DIAGNOSIS — J189 Pneumonia, unspecified organism: Secondary | ICD-10-CM | POA: Diagnosis present

## 2014-09-16 DIAGNOSIS — M869 Osteomyelitis, unspecified: Secondary | ICD-10-CM | POA: Diagnosis present

## 2014-09-16 DIAGNOSIS — G9341 Metabolic encephalopathy: Secondary | ICD-10-CM | POA: Diagnosis present

## 2014-09-16 DIAGNOSIS — L97519 Non-pressure chronic ulcer of other part of right foot with unspecified severity: Secondary | ICD-10-CM

## 2014-09-16 DIAGNOSIS — E872 Acidosis: Secondary | ICD-10-CM | POA: Diagnosis present

## 2014-09-16 DIAGNOSIS — R6510 Systemic inflammatory response syndrome (SIRS) of non-infectious origin without acute organ dysfunction: Secondary | ICD-10-CM

## 2014-09-16 DIAGNOSIS — E1040 Type 1 diabetes mellitus with diabetic neuropathy, unspecified: Secondary | ICD-10-CM | POA: Diagnosis present

## 2014-09-16 DIAGNOSIS — R197 Diarrhea, unspecified: Secondary | ICD-10-CM

## 2014-09-16 DIAGNOSIS — E1143 Type 2 diabetes mellitus with diabetic autonomic (poly)neuropathy: Secondary | ICD-10-CM

## 2014-09-16 DIAGNOSIS — Z825 Family history of asthma and other chronic lower respiratory diseases: Secondary | ICD-10-CM

## 2014-09-16 DIAGNOSIS — G9340 Encephalopathy, unspecified: Secondary | ICD-10-CM

## 2014-09-16 DIAGNOSIS — R6520 Severe sepsis without septic shock: Secondary | ICD-10-CM | POA: Diagnosis present

## 2014-09-16 DIAGNOSIS — E1310 Other specified diabetes mellitus with ketoacidosis without coma: Secondary | ICD-10-CM | POA: Diagnosis present

## 2014-09-16 DIAGNOSIS — Z978 Presence of other specified devices: Secondary | ICD-10-CM

## 2014-09-16 DIAGNOSIS — R7881 Bacteremia: Secondary | ICD-10-CM

## 2014-09-16 DIAGNOSIS — L89899 Pressure ulcer of other site, unspecified stage: Secondary | ICD-10-CM | POA: Diagnosis present

## 2014-09-16 DIAGNOSIS — IMO0002 Reserved for concepts with insufficient information to code with codable children: Secondary | ICD-10-CM | POA: Diagnosis present

## 2014-09-16 DIAGNOSIS — E43 Unspecified severe protein-calorie malnutrition: Secondary | ICD-10-CM

## 2014-09-16 DIAGNOSIS — Y95 Nosocomial condition: Secondary | ICD-10-CM | POA: Diagnosis present

## 2014-09-16 DIAGNOSIS — IMO0001 Reserved for inherently not codable concepts without codable children: Secondary | ICD-10-CM | POA: Insufficient documentation

## 2014-09-16 DIAGNOSIS — R740 Nonspecific elevation of levels of transaminase and lactic acid dehydrogenase [LDH]: Secondary | ICD-10-CM | POA: Diagnosis present

## 2014-09-16 DIAGNOSIS — Z8673 Personal history of transient ischemic attack (TIA), and cerebral infarction without residual deficits: Secondary | ICD-10-CM | POA: Diagnosis not present

## 2014-09-16 DIAGNOSIS — A419 Sepsis, unspecified organism: Principal | ICD-10-CM | POA: Diagnosis present

## 2014-09-16 DIAGNOSIS — G8250 Quadriplegia, unspecified: Secondary | ICD-10-CM

## 2014-09-16 DIAGNOSIS — E1065 Type 1 diabetes mellitus with hyperglycemia: Secondary | ICD-10-CM

## 2014-09-16 DIAGNOSIS — G9511 Acute infarction of spinal cord (embolic) (nonembolic): Secondary | ICD-10-CM

## 2014-09-16 DIAGNOSIS — J9690 Respiratory failure, unspecified, unspecified whether with hypoxia or hypercapnia: Secondary | ICD-10-CM

## 2014-09-16 DIAGNOSIS — L89152 Pressure ulcer of sacral region, stage 2: Secondary | ICD-10-CM | POA: Diagnosis present

## 2014-09-16 DIAGNOSIS — L89210 Pressure ulcer of right hip, unstageable: Secondary | ICD-10-CM | POA: Diagnosis present

## 2014-09-16 DIAGNOSIS — L97529 Non-pressure chronic ulcer of other part of left foot with unspecified severity: Secondary | ICD-10-CM

## 2014-09-16 DIAGNOSIS — J690 Pneumonitis due to inhalation of food and vomit: Secondary | ICD-10-CM

## 2014-09-16 DIAGNOSIS — E1140 Type 2 diabetes mellitus with diabetic neuropathy, unspecified: Secondary | ICD-10-CM | POA: Diagnosis present

## 2014-09-16 DIAGNOSIS — J4520 Mild intermittent asthma, uncomplicated: Secondary | ICD-10-CM

## 2014-09-16 DIAGNOSIS — E875 Hyperkalemia: Secondary | ICD-10-CM

## 2014-09-16 DIAGNOSIS — B9561 Methicillin susceptible Staphylococcus aureus infection as the cause of diseases classified elsewhere: Secondary | ICD-10-CM | POA: Diagnosis present

## 2014-09-16 DIAGNOSIS — F17210 Nicotine dependence, cigarettes, uncomplicated: Secondary | ICD-10-CM | POA: Diagnosis present

## 2014-09-16 DIAGNOSIS — L8994 Pressure ulcer of unspecified site, stage 4: Secondary | ICD-10-CM

## 2014-09-16 DIAGNOSIS — R509 Fever, unspecified: Secondary | ICD-10-CM | POA: Diagnosis present

## 2014-09-16 DIAGNOSIS — L89159 Pressure ulcer of sacral region, unspecified stage: Secondary | ICD-10-CM

## 2014-09-16 DIAGNOSIS — A4101 Sepsis due to Methicillin susceptible Staphylococcus aureus: Secondary | ICD-10-CM

## 2014-09-16 DIAGNOSIS — K219 Gastro-esophageal reflux disease without esophagitis: Secondary | ICD-10-CM | POA: Diagnosis present

## 2014-09-16 DIAGNOSIS — J811 Chronic pulmonary edema: Secondary | ICD-10-CM

## 2014-09-16 DIAGNOSIS — Z4659 Encounter for fitting and adjustment of other gastrointestinal appliance and device: Secondary | ICD-10-CM | POA: Insufficient documentation

## 2014-09-16 LAB — URINALYSIS, ROUTINE W REFLEX MICROSCOPIC
BILIRUBIN URINE: NEGATIVE
Glucose, UA: 250 mg/dL — AB
KETONES UR: 15 mg/dL — AB
Nitrite: NEGATIVE
PROTEIN: 100 mg/dL — AB
Specific Gravity, Urine: 1.019 (ref 1.005–1.030)
UROBILINOGEN UA: 1 mg/dL (ref 0.0–1.0)
pH: 5.5 (ref 5.0–8.0)

## 2014-09-16 LAB — I-STAT ARTERIAL BLOOD GAS, ED
ACID-BASE DEFICIT: 3 mmol/L — AB (ref 0.0–2.0)
Bicarbonate: 22.3 mEq/L (ref 20.0–24.0)
O2 Saturation: 100 %
PO2 ART: 440 mmHg — AB (ref 80.0–100.0)
Patient temperature: 98.6
TCO2: 23 mmol/L (ref 0–100)
pCO2 arterial: 40.2 mmHg (ref 35.0–45.0)
pH, Arterial: 7.351 (ref 7.350–7.450)

## 2014-09-16 LAB — CBC WITH DIFFERENTIAL/PLATELET
Band Neutrophils: 0 % (ref 0–10)
Basophils Absolute: 0 10*3/uL (ref 0.0–0.1)
Basophils Relative: 0 % (ref 0–1)
Blasts: 0 %
EOS ABS: 0 10*3/uL (ref 0.0–0.7)
EOS PCT: 0 % (ref 0–5)
HCT: 24.8 % — ABNORMAL LOW (ref 39.0–52.0)
Hemoglobin: 7.9 g/dL — ABNORMAL LOW (ref 13.0–17.0)
LYMPHS ABS: 3.2 10*3/uL (ref 0.7–4.0)
LYMPHS PCT: 10 % — AB (ref 12–46)
MCH: 26.6 pg (ref 26.0–34.0)
MCHC: 31.9 g/dL (ref 30.0–36.0)
MCV: 83.5 fL (ref 78.0–100.0)
MYELOCYTES: 0 %
Metamyelocytes Relative: 0 %
Monocytes Absolute: 1.6 10*3/uL — ABNORMAL HIGH (ref 0.1–1.0)
Monocytes Relative: 5 % (ref 3–12)
NEUTROS ABS: 27.1 10*3/uL — AB (ref 1.7–7.7)
NEUTROS PCT: 85 % — AB (ref 43–77)
NRBC: 0 /100{WBCs}
PLATELETS: 527 10*3/uL — AB (ref 150–400)
Promyelocytes Absolute: 0 %
RBC: 2.97 MIL/uL — ABNORMAL LOW (ref 4.22–5.81)
RDW: 15.7 % — ABNORMAL HIGH (ref 11.5–15.5)
WBC: 31.9 10*3/uL — ABNORMAL HIGH (ref 4.0–10.5)

## 2014-09-16 LAB — URINE MICROSCOPIC-ADD ON

## 2014-09-16 LAB — GLUCOSE, CAPILLARY
GLUCOSE-CAPILLARY: 321 mg/dL — AB (ref 70–99)
GLUCOSE-CAPILLARY: 277 mg/dL — AB (ref 70–99)
GLUCOSE-CAPILLARY: 84 mg/dL (ref 70–99)
Glucose-Capillary: 252 mg/dL — ABNORMAL HIGH (ref 70–99)
Glucose-Capillary: 222 mg/dL — ABNORMAL HIGH (ref 70–99)
Glucose-Capillary: 178 mg/dL — ABNORMAL HIGH (ref 70–99)
Glucose-Capillary: 149 mg/dL — ABNORMAL HIGH (ref 70–99)
Glucose-Capillary: 117 mg/dL — ABNORMAL HIGH (ref 70–99)
Glucose-Capillary: 91 mg/dL (ref 70–99)
Glucose-Capillary: 78 mg/dL (ref 70–99)
Glucose-Capillary: 72 mg/dL (ref 70–99)

## 2014-09-16 LAB — I-STAT CHEM 8, ED
BUN: 27 mg/dL — ABNORMAL HIGH (ref 6–23)
Calcium, Ion: 1.41 mmol/L — ABNORMAL HIGH (ref 1.12–1.23)
Chloride: 103 mEq/L (ref 96–112)
Creatinine, Ser: 2 mg/dL — ABNORMAL HIGH (ref 0.50–1.35)
GLUCOSE: 299 mg/dL — AB (ref 70–99)
HEMATOCRIT: 27 % — AB (ref 39.0–52.0)
Hemoglobin: 9.2 g/dL — ABNORMAL LOW (ref 13.0–17.0)
Potassium: 5.2 mEq/L (ref 3.7–5.3)
SODIUM: 136 meq/L — AB (ref 137–147)
TCO2: 20 mmol/L (ref 0–100)

## 2014-09-16 LAB — COMPREHENSIVE METABOLIC PANEL
ALT: 113 U/L — ABNORMAL HIGH (ref 0–53)
ANION GAP: 15 (ref 5–15)
AST: 223 U/L — ABNORMAL HIGH (ref 0–37)
Albumin: 2.4 g/dL — ABNORMAL LOW (ref 3.5–5.2)
Alkaline Phosphatase: 120 U/L — ABNORMAL HIGH (ref 39–117)
BUN: 28 mg/dL — AB (ref 6–23)
CHLORIDE: 95 meq/L — AB (ref 96–112)
CO2: 21 mEq/L (ref 19–32)
CREATININE: 1.68 mg/dL — AB (ref 0.50–1.35)
Calcium: 10.6 mg/dL — ABNORMAL HIGH (ref 8.4–10.5)
GFR calc non Af Amer: 53 mL/min — ABNORMAL LOW (ref >90)
GFR, EST AFRICAN AMERICAN: 62 mL/min — AB (ref >90)
GLUCOSE: 282 mg/dL — AB (ref 70–99)
Potassium: 5.1 mEq/L (ref 3.7–5.3)
Sodium: 131 mEq/L — ABNORMAL LOW (ref 137–147)
Total Protein: 8 g/dL (ref 6.0–8.3)

## 2014-09-16 LAB — I-STAT CG4 LACTIC ACID, ED
Lactic Acid, Venous: 1.07 mmol/L (ref 0.5–2.2)
Lactic Acid, Venous: 1.66 mmol/L (ref 0.5–2.2)

## 2014-09-16 LAB — MRSA PCR SCREENING: MRSA by PCR: NEGATIVE

## 2014-09-16 LAB — VANCOMYCIN, RANDOM: VANCOMYCIN RM: 27 ug/mL

## 2014-09-16 LAB — PHOSPHORUS: PHOSPHORUS: 5.5 mg/dL — AB (ref 2.3–4.6)

## 2014-09-16 LAB — PROCALCITONIN: PROCALCITONIN: 5.63 ng/mL

## 2014-09-16 LAB — MAGNESIUM: Magnesium: 1.6 mg/dL (ref 1.5–2.5)

## 2014-09-16 LAB — AMMONIA: Ammonia: 24 umol/L (ref 11–60)

## 2014-09-16 MED ORDER — FENTANYL CITRATE 0.05 MG/ML IJ SOLN: 50.0000 ug | Freq: Once | INTRAMUSCULAR | Status: DC

## 2014-09-16 MED ORDER — ROCURONIUM BROMIDE 50 MG/5ML IV SOLN
INTRAVENOUS | Status: AC | PRN
  Administered 2014-09-16: 80 mg via INTRAVENOUS

## 2014-09-16 MED ORDER — SODIUM CHLORIDE 0.9 % IV SOLN: 250.0000 mL | INTRAVENOUS | Status: DC | PRN

## 2014-09-16 MED ORDER — INSULIN GLARGINE 100 UNIT/ML ~~LOC~~ SOLN
10.0000 [IU] | Freq: Every day | SUBCUTANEOUS | Status: DC
  Administered 2014-09-17 – 2014-09-20 (×4): 10 [IU] via SUBCUTANEOUS
  Filled 2014-09-16 (×6): qty 0.1

## 2014-09-16 MED ORDER — MIDAZOLAM HCL 2 MG/2ML IJ SOLN
2.0000 mg | INTRAMUSCULAR | Status: DC | PRN
  Filled 2014-09-16 (×3): qty 2

## 2014-09-16 MED ORDER — FENTANYL CITRATE 0.05 MG/ML IJ SOLN
50.0000 ug | Freq: Once | INTRAMUSCULAR | Status: AC
Start: 2014-09-16 — End: 2014-09-16
  Administered 2014-09-16: 50 ug via INTRAVENOUS
  Filled 2014-09-16: qty 2

## 2014-09-16 MED ORDER — SODIUM CHLORIDE 0.9 % IV SOLN
25.0000 ug/h | INTRAVENOUS | Status: DC
  Administered 2014-09-16: 25 ug/h via INTRAVENOUS
  Administered 2014-09-17 (×2): 300 ug/h via INTRAVENOUS
  Administered 2014-09-18: 400 ug/h via INTRAVENOUS
  Administered 2014-09-18: 150 ug/h via INTRAVENOUS
  Filled 2014-09-16 (×5): qty 50

## 2014-09-16 MED ORDER — MIDAZOLAM HCL 2 MG/2ML IJ SOLN
INTRAMUSCULAR | Status: AC
Start: 2014-09-16 — End: 2014-09-16
  Administered 2014-09-16: 2 mg via INTRAVENOUS
  Filled 2014-09-16: qty 2

## 2014-09-16 MED ORDER — LEVOFLOXACIN IN D5W 750 MG/150ML IV SOLN
750.0000 mg | Freq: Once | INTRAVENOUS | Status: AC
  Administered 2014-09-16: 750 mg via INTRAVENOUS
  Filled 2014-09-16: qty 150

## 2014-09-16 MED ORDER — ETOMIDATE 2 MG/ML IV SOLN
INTRAVENOUS | Status: AC | PRN
  Administered 2014-09-16: 20 mg via INTRAVENOUS

## 2014-09-16 MED ORDER — CHLORHEXIDINE GLUCONATE 0.12 % MT SOLN
15.0000 mL | Freq: Two times a day (BID) | OROMUCOSAL | Status: DC
Start: 2014-09-16 — End: 2014-09-21
  Administered 2014-09-16 – 2014-09-21 (×11): 15 mL via OROMUCOSAL
  Filled 2014-09-16 (×15): qty 15

## 2014-09-16 MED ORDER — MUPIROCIN CALCIUM 2 % EX CREA
TOPICAL_CREAM | Freq: Two times a day (BID) | CUTANEOUS | Status: DC
  Administered 2014-09-16 – 2014-09-19 (×7): via TOPICAL
  Administered 2014-09-19: 1 via TOPICAL
  Administered 2014-09-20: 22:00:00 via TOPICAL
  Administered 2014-09-21 (×2): 1 via TOPICAL
  Administered 2014-09-22 – 2014-09-24 (×5): via TOPICAL
  Filled 2014-09-16 (×2): qty 15

## 2014-09-16 MED ORDER — SODIUM CHLORIDE 0.9 % IV SOLN
INTRAVENOUS | Status: DC
  Administered 2014-09-16: 2.2 [IU]/h via INTRAVENOUS
  Filled 2014-09-16: qty 2.5

## 2014-09-16 MED ORDER — SODIUM CHLORIDE 0.9 % IV SOLN
INTRAVENOUS | Status: DC
  Administered 2014-09-16 – 2014-09-19 (×3): via INTRAVENOUS

## 2014-09-16 MED ORDER — CETYLPYRIDINIUM CHLORIDE 0.05 % MT LIQD
7.0000 mL | Freq: Four times a day (QID) | OROMUCOSAL | Status: DC
  Administered 2014-09-16 – 2014-09-21 (×21): 7 mL via OROMUCOSAL

## 2014-09-16 MED ORDER — PANTOPRAZOLE SODIUM 40 MG IV SOLR
40.0000 mg | Freq: Every day | INTRAVENOUS | Status: DC
  Administered 2014-09-16 – 2014-09-21 (×6): 40 mg via INTRAVENOUS
  Filled 2014-09-16 (×7): qty 40

## 2014-09-16 MED ORDER — MIDAZOLAM HCL 2 MG/2ML IJ SOLN
2.0000 mg | INTRAMUSCULAR | Status: DC | PRN
  Administered 2014-09-16 – 2014-09-18 (×8): 2 mg via INTRAVENOUS
  Filled 2014-09-16 (×3): qty 2

## 2014-09-16 MED ORDER — SODIUM CHLORIDE 0.9 % IV BOLUS (SEPSIS)
1000.0000 mL | INTRAVENOUS | Status: AC
  Administered 2014-09-16 (×2): 1000 mL via INTRAVENOUS

## 2014-09-16 MED ORDER — GADOBENATE DIMEGLUMINE 529 MG/ML IV SOLN
6.0000 mL | Freq: Once | INTRAVENOUS | Status: AC | PRN
  Administered 2014-09-16: 6 mL via INTRAVENOUS

## 2014-09-16 MED ORDER — COLLAGENASE 250 UNIT/GM EX OINT
TOPICAL_OINTMENT | Freq: Every day | CUTANEOUS | Status: DC
  Administered 2014-09-16 – 2014-09-20 (×5): via TOPICAL
  Administered 2014-09-21: 1 via TOPICAL
  Administered 2014-09-22 – 2014-09-24 (×3): via TOPICAL
  Filled 2014-09-16 (×2): qty 30

## 2014-09-16 MED ORDER — VANCOMYCIN HCL IN DEXTROSE 1-5 GM/200ML-% IV SOLN: 1000.0000 mg | Freq: Once | INTRAVENOUS | Status: DC

## 2014-09-16 MED ORDER — SODIUM CHLORIDE 0.9 % IV SOLN
1.0000 g | Freq: Three times a day (TID) | INTRAVENOUS | Status: DC
  Administered 2014-09-16 – 2014-09-20 (×13): 1 g via INTRAVENOUS
  Filled 2014-09-16 (×15): qty 1

## 2014-09-16 MED ORDER — DEXTROSE 50 % IV SOLN
INTRAVENOUS | Status: AC
  Administered 2014-09-16: 25 mL
  Filled 2014-09-16: qty 50

## 2014-09-16 MED ORDER — FENTANYL BOLUS VIA INFUSION
50.0000 ug | INTRAVENOUS | Status: DC | PRN
  Administered 2014-09-16 – 2014-09-17 (×7): 50 ug via INTRAVENOUS
  Filled 2014-09-16: qty 50

## 2014-09-16 MED ORDER — ALBUTEROL SULFATE (2.5 MG/3ML) 0.083% IN NEBU: 2.5000 mg | INHALATION_SOLUTION | RESPIRATORY_TRACT | Status: DC | PRN

## 2014-09-16 MED ORDER — INSULIN ASPART 100 UNIT/ML ~~LOC~~ SOLN
2.0000 [IU] | SUBCUTANEOUS | Status: DC
  Administered 2014-09-17 (×2): 2 [IU] via SUBCUTANEOUS
  Administered 2014-09-17: 6 [IU] via SUBCUTANEOUS
  Administered 2014-09-17 – 2014-09-18 (×2): 2 [IU] via SUBCUTANEOUS
  Administered 2014-09-18 – 2014-09-19 (×5): 4 [IU] via SUBCUTANEOUS
  Administered 2014-09-19: 2 [IU] via SUBCUTANEOUS
  Administered 2014-09-19 (×2): 4 [IU] via SUBCUTANEOUS
  Administered 2014-09-20: 2 [IU] via SUBCUTANEOUS
  Administered 2014-09-20: 4 [IU] via SUBCUTANEOUS
  Administered 2014-09-20 (×2): 2 [IU] via SUBCUTANEOUS
  Administered 2014-09-21: 4 [IU] via SUBCUTANEOUS
  Administered 2014-09-21 (×2): 6 [IU] via SUBCUTANEOUS

## 2014-09-16 MED ORDER — INSULIN ASPART 100 UNIT/ML ~~LOC~~ SOLN
2.0000 [IU] | SUBCUTANEOUS | Status: DC
Start: 2014-09-16 — End: 2014-09-16

## 2014-09-16 MED ORDER — FLUCONAZOLE IN SODIUM CHLORIDE 200-0.9 MG/100ML-% IV SOLN
200.0000 mg | INTRAVENOUS | Status: DC
  Administered 2014-09-16 – 2014-09-20 (×5): 200 mg via INTRAVENOUS
  Filled 2014-09-16 (×5): qty 100

## 2014-09-16 MED ORDER — PRO-STAT SUGAR FREE PO LIQD
30.0000 mL | Freq: Two times a day (BID) | ORAL | Status: AC
  Administered 2014-09-16: 30 mL
  Filled 2014-09-16 (×2): qty 30

## 2014-09-16 MED ORDER — DEXTROSE 10 % IV SOLN
INTRAVENOUS | Status: DC | PRN
  Administered 2014-09-19 – 2014-09-20 (×2): via INTRAVENOUS

## 2014-09-16 MED ORDER — VITAL AF 1.2 CAL PO LIQD
1000.0000 mL | ORAL | Status: DC
  Administered 2014-09-16 – 2014-09-18 (×3): 1000 mL
  Filled 2014-09-16 (×12): qty 1000

## 2014-09-16 NOTE — Progress Notes (Addendum)
INITIAL NUTRITION ASSESSMENT  DOCUMENTATION CODES Per approved criteria  -Severe malnutrition in the context of acute illness or injury   Pt meets criteria for severe MALNUTRITION in the context of acute illness/injury as evidenced by moderate depletion of muscle mass and 12% weight loss within 1.5 months.  INTERVENTION:  Initiate TF via OGT with Vital AF 1.2 at 25 ml/h and Prostat 30 ml BID on day 1; on day 2, d/c Prostat and increase to goal rate of 60 ml/h (1440 ml per day) to provide 1728 kcals, 108 gm protein, 1168 ml free water daily. Monitor magnesium, potassium, and phosphorus daily for at least 3 days, MD to replete as needed, as pt is at risk for refeeding syndrome given severe PCM.  NUTRITION DIAGNOSIS: Increased nutrient needs related to multiple wounds as evidenced by estimated calorie and protein needs.   Goal: Intake to meet >90% of estimated nutrition needs.  Monitor:  TF initiation/tolerance/adequacy, weight trend, labs, vent status.  Reason for Assessment: Consult for wound healing  28 y.o. male  Admitting Dx: AMS  ASSESSMENT: 28 year old with complex medical history, paraplegic, presented to [facility] ED 12/10 with altered mental status and fevers. He was unresponsive in ED requiring intubation. He was noted to have profound leukocytosis.  Patient with multiple wounds, requiring increased protein and calories to support healing. Per review of home medications, patient was receiving Glucerna Shakes TID and Prostat 30 ml daily PO. Reported poor oral intake PTA.  Discussed patient in ICU rounds today. RD to order TF today. OGT is in place. Plans for MRI today to r/o osteomyelitis.   Nutrition Focused Physical Exam:  Subcutaneous Fat:  Orbital Region: NA Upper Arm Region: NA Thoracic and Lumbar Region: NA  Muscle:  Temple Region: WNL Clavicle Bone Region: mild depletion Clavicle and Acromion Bone Region: moderate depletion Scapular Bone Region: NA Dorsal Hand:  NA Patellar Region: severe depletion (related to paraplegia) Anterior Thigh Region: severe depletion (related to paraplegia) Posterior Calf Region: NA  Edema: none  Patient is currently intubated on ventilator support MV: 8.3 L/min Temp (24hrs), Avg:98.4 F (36.9 C), Min:97.7 F (36.5 C), Max:100 F (37.8 C)  Propofol: none  Height: Ht Readings from Last 1 Encounters:  09/16/14 5\' 8"  (1.727 m)    Weight: Wt Readings from Last 1 Encounters:  09/16/14 123 lb 10.9 oz (56.1 kg)    Ideal Body Weight: ~66 kg  % Ideal Body Weight: 85%  Wt Readings from Last 10 Encounters:  09/16/14 123 lb 10.9 oz (56.1 kg)  08/13/14 129 lb 6.6 oz (58.7 kg)  08/03/14 139 lb (63.05 kg)  07/30/14 139 lb 1 oz (63.078 kg)  07/19/14 135 lb 12.9 oz (61.6 kg)  05/28/14 138 lb 14.2 oz (63 kg)  05/20/14 141 lb (63.957 kg)  04/19/14 140 lb (63.504 kg)  04/02/14 140 lb (63.504 kg)  03/22/14 140 lb (63.504 kg)    Usual Body Weight: 139 lb (1.5 months ago)  % Usual Body Weight: 88%  BMI:  Body mass index is 18.81 kg/(m^2).  Estimated Nutritional Needs: Kcal: 3536 Protein: 90-110 gm Fluid: 1.8-2 L  Skin: unstageable pressure ulcer to right ischium, stage 2 pressure ulcer to sacrum; full thickness wound to left finger; several full thickness wounds to left foot  Diet Order: Diet NPO time specified  EDUCATION NEEDS: -Education not appropriate at this time  No intake or output data in the 24 hours ending 09/16/14 0915  Last BM: PTA   Labs:   Recent  Labs Lab 09/16/14 0335 09/16/14 0342  NA 131* 136*  K 5.1 5.2  CL 95* 103  CO2 21  --   BUN 28* 27*  CREATININE 1.68* 2.00*  CALCIUM 10.6*  --   MG 1.6  --   PHOS 5.5*  --   GLUCOSE 282* 299*    CBG (last 3)   Recent Labs  09/16/14 0653  GLUCAP 321*    Scheduled Meds: . antiseptic oral rinse  7 mL Mouth Rinse QID  . chlorhexidine  15 mL Mouth Rinse BID  . collagenase   Topical Daily  . fentaNYL  50 mcg Intravenous Once   . fluconazole (DIFLUCAN) IV  200 mg Intravenous Q24H  . meropenem (MERREM) IV  1 g Intravenous 3 times per day  . mupirocin cream   Topical BID  . pantoprazole (PROTONIX) IV  40 mg Intravenous QHS    Continuous Infusions: . sodium chloride 75 mL/hr at 09/16/14 0615  . fentaNYL infusion INTRAVENOUS    . insulin (NOVOLIN-R) infusion 2.2 Units/hr (09/16/14 0912)    Past Medical History  Diagnosis Date  . GERD (gastroesophageal reflux disease)   . Asthma   . Hx MRSA infection     on face  . Gastroparesis   . Diabetic neuropathy   . Seizures   . Stroke     spinal stroke in 4/15  . Diabetes mellitus     sees Dr. Loanne Drilling   . Family history of anesthesia complication     Pt mother can't have epidural procedures  . Dysrhythmia   . Pneumonia   . Arthritis   . Fibromyalgia     Past Surgical History  Procedure Laterality Date  . Tonsillectomy    . Multiple extractions with alveoloplasty N/A 08/03/2014    Procedure: MULTIPLE EXTRACTIONS;  Surgeon: Gae Bon, DDS;  Location: [place];  Service: Oral Surgery;  Laterality: N/A;  . Tee without cardioversion N/A 08/17/2014    Procedure: TRANSESOPHAGEAL ECHOCARDIOGRAM (TEE);  Surgeon: Dorothy Spark, MD;  Location: [place];  Service: Cardiovascular;  Laterality: N/A;     Molli Barrows, [place], [place], [place] Pager [phone] After Hours Pager [phone]

## 2014-09-16 NOTE — Progress Notes (Addendum)
ANTIBIOTIC CONSULT NOTE - INITIAL  Pharmacy Consult for Vancomycin, Merrem, Fluconazole  Indication: rule out sepsis, UTI  Allergies  Allergen Reactions  . Cefuroxime Axetil Anaphylaxis  . Penicillins Anaphylaxis and Other (See Comments)    ?can take amoxicillin?  [name] [Benzonatate] Anaphylaxis  . Shellfish Allergy Itching and Other (See Comments)    Took benadryl to alleviate reaction    Vital Signs: Temp: 97.7 F (36.5 C) (12/10 0600) Temp Source: Rectal (12/10 0408) BP: 147/84 mmHg (12/10 0600) Pulse Rate: 88 (12/10 0600) Intake/Output from previous day:   Intake/Output from this shift:    Labs:  Recent Labs  09/16/14 0335 09/16/14 0342  WBC 31.9*  --   HGB 7.9* 9.2*  PLT 527*  --   CREATININE 1.68* 2.00*    Medical History: Past Medical History  Diagnosis Date  . GERD (gastroesophageal reflux disease)   . Asthma   . Hx MRSA infection     on face  . Gastroparesis   . Diabetic neuropathy   . Seizures   . Stroke     spinal stroke in 4/15  . Diabetes mellitus     sees Dr. Loanne Drilling   . Family history of anesthesia complication     Pt mother can't have epidural procedures  . Dysrhythmia   . Pneumonia   . Arthritis   . Fibromyalgia     Assessment: 28 y/o paraplegic male intubated in the ED due to AMS, pt is febrile, marked leukocytosis present, pt is in acute renal failure with Scr ~2 (baseline is around 0.6-0.7), pt is on VANCOMYCIN at home for leg wounds Noted drug allergies, tolerated Primaxin x 1 in the ED last month.   Goal of Therapy:  Vancomycin trough level 15-20 mcg/ml  Plan:  -Check random vancomycin level before dosing given acute renal failure -Merrem 1g IV q8h -Fluconazole 200 mg IV q24h -Trend WBC, temp, renal function  -Drug levels as indicated   Narda Bonds 09/16/2014,6:34 AM  Addendum: Random vancomycin level is supratherapeutic at 27. No need for redosing at this time. Unclear how patient will be clearing drug with  current acute renal failures.   Plan: 1. Hold vancomycin for now 2. F/u AM vancomycin level to assess how patient is clearing dru  Salome Arnt, PharmD, BCPS Pager # [phone] 09/16/2014 9:36 AM

## 2014-09-16 NOTE — ED Notes (Signed)
Urine collected and sent to lab.

## 2014-09-16 NOTE — ED Notes (Signed)
Dr.Oni at bedside for intubation

## 2014-09-16 NOTE — Progress Notes (Signed)
Pt intubated by Dr Claudine Mouton with one attempt using MAC 3 and 8.0 ETT. Placement confirmed by CXR, BBS and positive color change on end tidal. Secured at 25 at the lip.

## 2014-09-16 NOTE — Consult Note (Addendum)
WOC wound consult note Reason for Consult: Consult requested for multiple wounds. Pt familiar to [facility] team from previous admission.  Right ischium was followed recently for debridement and hydrotherapy by [facility] team; refer to progress notes on 11/16. Pt has several wounds and multiple systemic factors that can impair healing; immobile, incontinent, emecated, septic. Wound type: Sacrum/buttocks stage 2:4X4X.1cm, dark red with patchy open areas. Red and macerated. No odor or drainage, difficult to keep from becoming soiled related to frequent incontinent stools since rectum is located close to the site.  Right ischium unstageable; 4X6X4cm, 100% yellow slough.  Mod amt tan drainage with strong odor.   Scrotum and inner  Buttocks with patchy areas of partail thickness skin loss; appearance consistent with moisture associated skin damage. Left finger with 3rd digit near middle joint area; full thickness wound, 1X1cm 100% tightly adhered eschar.  No odor or drainage. Left foot with several full thickness wounds; appears r/t poor perfusion.  5th toe 2X1X.1cm, 100% red and moist.  No odor or drainage. 4th toe extending to anterior foot 4X2X.1cm; 50% dry tightly adhered eschar to toe, 50% red and moist without odor or drainage. Great toe 4X1X1.cm, 100% red and moist.  No odor or drainage. Pressure Ulcer POA: Yes Dressing procedure/placement/frequency:  Eschar to left finger is dry and stable; no topical treatment indicated at this time. Bactroban to left toes and foot wound to promote moist healing.  Float heels to reduce pressure.  Foam dressing to sacrum to protect from soiling, barrier cream to buttocks.scrotum to repel moisture when incontinent.  Santyl to chemically debride nonviable tissue to right ischium.  Pt is on a Sport low-airloss bed to reduce pressure.  Nutrition consult to optimize nutrition. One hour spent performing this consult.  If source of sepsis is a concern; consider MRI to right ischium to  r/o osteomyelitis If aggressive plan of care is desired, consider consult to hand surgeon for necrotic area on left finger since it could involve the joint. Please re-consult if further assistance is needed.  Thank-you,  Julien Girt MSN, [place], [place], [place], [place]

## 2014-09-16 NOTE — ED Notes (Signed)
Critical Care NP at bedside 

## 2014-09-16 NOTE — Plan of Care (Signed)
Problem: Consults Goal: Ventilated Patients Patient Education See Patient Education Module for education specifics. Outcome: Completed/Met Date Met:  09/16/14 Goal: Skin Care Protocol Initiated - if Braden Score 18 or less If consults are not indicated, leave blank or document N/A Outcome: Completed/Met Date Met:  09/16/14 Goal: Nutrition Consult-if indicated Outcome: Completed/Met Date Met:  09/16/14 Goal: Diabetes Guidelines if Diabetic/Glucose > 140 If diabetic or lab glucose is > 140 mg/dl - Initiate Diabetes/Hyperglycemia Guidelines & Document Interventions  Outcome: Completed/Met Date Met:  09/16/14

## 2014-09-16 NOTE — ED Notes (Signed)
Pt to ED for altered mental status. On arrival, snoring respirations noted. Pt only responsive to painful stimuli. PICC noted to L inner arm; yellow discharge noted under bandage. Mother reports PICC line would not flush today. Pt receives IV vancomycin for wounds to L foot. At baseline, pt A&O, conversates and can move upper extremities to some extent. Reddened wound noted to distal foot; no drainage noted. Two pressure ulcers noted to buttocks. Bandage noted to L leg.

## 2014-09-16 NOTE — ED Notes (Signed)
Pt to ED via [facility] from home c/o altered mental status. Pt responsive to painful stimuli only. Pt is on vancomycin at home for wounds to feet. Snoring respirations on arrival

## 2014-09-16 NOTE — H&P (Addendum)
PULMONARY / CRITICAL CARE MEDICINE   Name: Jason [name] MRN: [id number] DOB: 1986/02/17    ADMISSION DATE:  09/16/2014 CONSULTATION DATE:  121/0/2015  REFERRING MD :  EDP Oni  CHIEF COMPLAINT:  AMS  INITIAL PRESENTATION: 29 year old with complex medical history, paraplegic, presented to [facility] ED 12/10 with altered mental status and fevers. He was unresponsive in ED requiring intubation. He was noted to have profound leukocytosis,started on ABX and PCCM was asked to see for admission.   STUDIES:   SIGNIFICANT EVENTS:  HISTORY OF PRESENT ILLNESS:  28 year old male with PMH as below, which includes paraplegia s/p spinal cord infarct, DM with frequent DKA, CVA, Seizures, PNA. He was recently admitted to [facility] 08/2014 with MSSA bacteremia suspected due to decubitus ulcers of which he has several. He was discharged 11/19 with PICC and sent home on IV vancomycin. Since discharge his PICC was unintentionally pulled, and was replaced on 11/30. 12/8 he began to be irregularly fatigued and had a noticeable decrease in PO intake. Since that time he became progressively lethargic and developed fevers. 12/10 he was noted to be unresponsive at home by his mother. EMS was called and he was transported to ED. EMS reported a fever of 103. In ED he was unresponsive and was intubated for airway protection. He was noted to have profound leukocytosis, started on broad spectrum antibiotics, and PCCM was consulted for admission.   PAST MEDICAL HISTORY :   has a past medical history of GERD (gastroesophageal reflux disease); Asthma; MRSA infection; Gastroparesis; Diabetic neuropathy; Seizures; Stroke; Diabetes mellitus; Family history of anesthesia complication; Dysrhythmia; Pneumonia; Arthritis; and Fibromyalgia.  has past surgical history that includes Tonsillectomy; Multiple extractions with alveoloplasty (N/A, 08/03/2014); and TEE without cardioversion (N/A, 08/17/2014). Prior to Admission medications   Medication  Sig Start Date End Date Taking? Authorizing Provider  albuterol (PROVENTIL) (2.5 MG/3ML) 0.083% nebulizer solution Take 3 mLs (2.5 mg total) by nebulization every 4 (four) hours as needed for wheezing or shortness of breath. 04/27/14   [name], MD  ALPRAZolam Duanne Moron) 1 MG tablet Take 1 tablet (1 mg total) by mouth every 6 (six) hours as needed for anxiety. 08/27/14   [name], MD  Amino Acids-Protein Hydrolys (FEEDING SUPPLEMENT, PRO-STAT SUGAR FREE 64,) LIQD Take 30 mLs by mouth daily. 08/24/14   Orson Eva, MD  amLODipine (NORVASC) 2.5 MG tablet Take 1 tablet (2.5 mg total) by mouth daily. 07/30/14   Shanker Kristeen Mans, MD  atorvastatin (LIPITOR) 20 MG tablet Take 20 mg by mouth daily. 07/20/14   Historical Provider, MD  baclofen (LIORESAL) 10 MG tablet Take 10-20 mg by mouth 3 (three) times daily. Takes 10mg  in morning and lunchtime and takes 20mg  at bedtime    Historical Provider, MD  bisacodyl (DULCOLAX) 5 MG EC tablet Take 1 tablet (5 mg total) by mouth 2 (two) times daily as needed for moderate constipation. 09/15/14   [name], MD  collagenase (SANTYL) ointment Apply 1 application topically daily. 08/30/14   [name], MD  dexlansoprazole (DEXILANT) 60 MG capsule Take 60 mg by mouth daily.    Historical Provider, MD  diclofenac sodium (VOLTAREN) 1 % GEL Apply 2 g topically daily as needed (for pain).    Historical Provider, MD  diphenoxylate-atropine (LOMOTIL) 2.5-0.025 MG per tablet Take 1 tablet by mouth 2 (two) times daily. Patient taking differently: Take 1 tablet by mouth 2 (two) times daily as needed for diarrhea or loose stools.  07/21/14   Willia Craze, NP  feeding supplement, GLUCERNA SHAKE, (GLUCERNA SHAKE) LIQD Take 237 mLs by mouth 3 (three) times daily between meals. 08/24/14   Orson Eva, MD  fluconazole (DIFLUCAN) 200 MG tablet Take 1 tablet (200 mg total) by mouth daily. 09/15/14   [name], MD  furosemide (LASIX) 40 MG tablet Take 0.5 tablets (20 mg  total) by mouth daily as needed for fluid. 07/30/14   Shanker Kristeen Mans, MD  glucagon (GLUCAGON EMERGENCY) 1 MG injection Inject 1 mg into the vein once as needed. 04/20/14   [name], MD  glucose blood (ACCU-CHEK AVIVA PLUS) test strip Use to check blood sugar 4 times per day 07/21/14   Renato Shin, MD  insulin aspart (NOVOLOG) 100 UNIT/ML injection Inject 5 Units into the skin 3 (three) times daily with meals. 07/21/14   Renato Shin, MD  insulin glargine (LANTUS) 100 UNIT/ML injection Inject 0.26 mLs (26 Units total) into the skin daily. Patient taking differently: Inject 22 Units into the skin daily.  08/24/14   Orson Eva, MD  lidocaine (XYLOCAINE) 2 % jelly Apply 1 application topically 4 (four) times daily as needed (pain).  06/05/14   Historical Provider, MD  loratadine (CLARITIN) 10 MG tablet Take 10 mg by mouth daily.     Historical Provider, MD  metaxalone (SKELAXIN) 800 MG tablet Take 800 mg by mouth 3 (three) times daily as needed for muscle spasms.    Historical Provider, MD  methocarbamol (ROBAXIN) 750 MG tablet Take 750 mg by mouth 4 (four) times daily as needed. 07/07/14   Historical Provider, MD  metoCLOPramide (REGLAN) 5 MG tablet Take 1 tablet (5 mg total) by mouth 2 (two) times a week. And as needed for nausea 07/01/14   Meredith Staggers, MD  ondansetron [facility]) 8 MG tablet Take 8 mg by mouth 2 (two) times daily as needed for nausea or vomiting.  07/07/14   Historical Provider, MD  oxyCODONE (ROXICODONE) 15 MG immediate release tablet Take 1 tablet (15 mg total) by mouth every 6 (six) hours as needed for pain. 09/10/14   Meredith Staggers, MD  polyethylene glycol powder (GLYCOLAX/MIRALAX) powder Take 17 g by mouth daily. 09/15/14   [name], MD  pregabalin (LYRICA) 100 MG capsule Take 100-200 mg by mouth 4 (four) times daily. Take 100 mg by mouth in the morning, take 100 mg by mouth in the afternoon, take 100 mg by mouth in the evening and take 200 mg by mouth at bedtime.     Historical Provider, MD  silver sulfADIAZINE (SILVADENE) 1 % cream Apply 1 application topically 2 (two) times a week. Tuesday and thursday    Historical Provider, MD  vancomycin 1,250 mg in sodium chloride 0.9 % 250 mL Inject 1,250 mg into the vein daily. Last dose to be given on 09/24/14 08/24/14   Orson Eva, MD   Allergies  Allergen Reactions  . Cefuroxime Axetil Anaphylaxis  . Penicillins Anaphylaxis and Other (See Comments)    ?can take amoxicillin?  [name] [Benzonatate] Anaphylaxis  . Shellfish Allergy Itching and Other (See Comments)    Took benadryl to alleviate reaction    FAMILY HISTORY:  indicated that his mother is alive. He indicated that his father is alive.  SOCIAL HISTORY:  reports that he has been smoking Cigars and Cigarettes.  He has been smoking about 0.00 packs per day. He has never used smokeless tobacco. He reports that he does not drink alcohol  or use illicit drugs.  REVIEW OF SYSTEMS: obtained from mother Bolds are positive  Constitutional: weight loss, gain, night sweats, Fevers, chills, fatigue .  HEENT: headaches, Sore throat, sneezing, nasal congestion, post nasal drip, Difficulty swallowing, Tooth/dental problems, visual complaints visual changes, ear ache CV:  chest pain, radiates: ,Orthopnea, PND, swelling in lower extremities, dizziness, palpitations, syncope.  GI  heartburn, indigestion, abdominal pain, nausea, vomiting, diarrhea, change in bowel habits, loss of appetite, bloody stools.  Resp: cough, productive for thick clear secretions, hemoptysis, dyspnea, chest pain, pleuritic.  Skin: rash or itching or icterus GU: dysuria, change in color of urine, urgency or frequency. flank pain, hematuria  MS: Neck pain and rigidity. decreased range of motion  Psych: change in mood or affect. depression or anxiety.  Neuro: difficulty with speech, weakness, numbness, ataxia    SUBJECTIVE:  Having pain in his throat  VITAL SIGNS: Temp:  [97.7 F (36.5  C)-100 F (37.8 C)] 97.7 F (36.5 C) (12/10 0445) Pulse Rate:  [91-101] 91 (12/10 0445) Resp:  [0-19] 16 (12/10 0445) BP: (96-137)/(60-86) 134/79 mmHg (12/10 0445) SpO2:  [99 %-100 %] 100 % (12/10 0445) FiO2 (%):  [50 %-100 %] 50 % (12/10 0429) HEMODYNAMICS:   VENTILATOR SETTINGS: Vent Mode:  [-] PRVC FiO2 (%):  [50 %-100 %] 50 % Set Rate:  [16 bmp] 16 bmp Vt Set:  [530 mL] 530 mL PEEP:  [5 cmH20] 5 cmH20 Plateau Pressure:  [20 cmH20] 20 cmH20 INTAKE / OUTPUT: No intake or output data in the 24 hours ending 09/16/14 0517  PHYSICAL EXAMINATION: General:  Young thin male in NAD on vent.  Neuro:  Unresponsive on vent HEENT:  [place]/AT, PERRL, No JVD noted Cardiovascular:  Borderline tachy, regular, no MRG Lungs:  Coarse bilateral breath sounds. Respirations even, unlabored Abdomen:  Soft, non-distended. BS hypoactive x 4.  Musculoskeletal:  No acute deformity.  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    LABS:  CBC  Recent Labs Lab 09/16/14 0335 09/16/14 0342  WBC 31.9*  --   HGB 7.9* 9.2*  HCT 24.8* 27.0*  PLT 527*  --    Coag's No results for input(s): APTT, INR in the last 168 hours. BMET  Recent Labs Lab 09/16/14 0335 09/16/14 0342  NA 131* 136*  K 5.1 5.2  CL 95* 103  CO2 21  --   BUN 28* 27*  CREATININE 1.68* 2.00*  GLUCOSE 282* 299*   Electrolytes  Recent Labs Lab 09/16/14 0335  CALCIUM 10.6*   Sepsis Markers  Recent Labs Lab 09/16/14 0342  LATICACIDVEN 1.07   ABG  Recent Labs Lab 09/16/14 0424  PHART 7.351  PCO2ART 40.2  PO2ART 440.0*   Liver Enzymes  Recent Labs Lab 09/16/14 0335  AST 223*  ALT 113*  ALKPHOS 120*  BILITOT <0.2*  ALBUMIN 2.4*   Cardiac Enzymes No results for input(s): TROPONINI, PROBNP in the last 168 hours. Glucose No results for input(s): GLUCAP in the last 168 hours.  Imaging No results found.   ASSESSMENT / PLAN:  PULMONARY OETT 12/10 > A: Acute  hypoxic respiratory failure, in part intubated due to altered MS ? RML HCAP  P:   Full vent support, goal transition to PSV 12/10, assess for extubation if MS allows Follow CXR for evolving infiltrate VAP prevention per protocol PRN albuterol  CARDIOVASCULAR CVL LUE PICC 12/10 > A:  Hypotension 2/2 severe sepsis, improved with IVF H/o HTN P:  Tele MAP  goal > 33mm/Hg IVF hydration Hold amlodipine, statin, lasix  RENAL A:   AKI in setting sepsis P:   Hydrate Follow Bmet  GASTROINTESTINAL A:   Transaminitis P:   NPO SUP: IV Protonix Abdominal US Follow LFT  HEMATOLOGIC A:   Chronic anemia (negative workup on last admission)  P:  Follow CBC Hold VTE chemoprophylaxis in setting of anemia SCDs Transfuse per normal ICU guidelines  INFECTIOUS A:   Severe sepsis - potential etiologies include known MSSA bacteremia, HCAP, UTI (mother states usually fungal), ?meningitis. Note also transaminitis, etiology unclear  P:   BCx2 12/10> UC 12/10> Sputum 12/10>  Abx: Vancomycin, preadmission >>> Abx: Meropenem, start date 12/10, day 1 Diflucan: start date 12/10, day 1 Consider LP to r/o meningitis although MS has improved, other sources more likely Wound care consulting on sacral wound, L foot wound.  Consider ID consult  ENDOCRINE A:   DM with hyperglycemia    P:   CBG monitoring and SSI Holding home lantus  NEUROLOGIC A:   Acute metabolic encephalopathy in setting of sepsis H/o seizures, CVA  P:   RASS goal: -1 to 0 Fentanyl gtt and midazolam PRN per PAD protocol Monitor Holding preadmission xanax, baclofen, lyrica   FAMILY  - Updates:   - Inter-disciplinary family meet or Palliative Care meeting due by:  12/17   Georgann Housekeeper, Apolonio Schneiders Pulmonology/Critical Care Pager [phone] or [phone]   Attending Note:  I have examined patient, reviewed labs, studies and notes. I have discussed the case with Jaclynn Guarneri and I agree with the  data and plans as amended above. On my exam his MS is improving. Will assess SBT 12/10. Abd [name] ordered. Will likely also need MRI of sacrum, L foot for osteo. Independent CC time 45 minutes.   Baltazar Apo, MD, PhD 09/16/2014, 10:50 AM [facility] [phone] or if no answer [phone]

## 2014-09-16 NOTE — ED Notes (Signed)
PCCM at bedside,

## 2014-09-16 NOTE — Progress Notes (Signed)
Hypoglycemic Event  CBG: 65  Treatment: 1/2 amp d50  Symptoms: none  Follow-up CBG: Time:2320 CBG Result:128  Possible Reasons for Event:  npo    [name]  Remember to initiate Hypoglycemia Order Set & complete

## 2014-09-16 NOTE — ED Notes (Signed)
Family at bedside. 

## 2014-09-16 NOTE — ED Notes (Signed)
Previous foley removed; new temp foley catheter inserted; no urine returned; bladder scanner shows 96ml

## 2014-09-16 NOTE — ED Provider Notes (Signed)
CSN: [id number]     Arrival date & time 09/16/14  4888 History  This chart was scribed for Everlene Balls, MD by Marlowe Kays, ED Scribe. This patient was seen in room D33C/D33C and the patient's care was started at 3:18 AM.  No chief complaint on file.  The history is provided by the EMS personnel. No language interpreter was used.    LEVEL 5 CAVEAT- Full history could not be obtained due to unresponsiveness.  HPI Comments:  GRADY MOHABIR is a 28 y.o. 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 Parents report fever Tmax 101 degrees but EMS report Tmax 103 degrees. Pt has h/o MRSA, DM, fibromyalgia, asthma and arthritis.  Past Medical History  Diagnosis Date  . GERD (gastroesophageal reflux disease)   . Asthma   . Hx MRSA infection     on face  . Gastroparesis   . Diabetic neuropathy   . Seizures   . Stroke     spinal stroke in 4/15  . Diabetes mellitus     sees Dr. Loanne Drilling   . Family history of anesthesia complication     Pt mother can't have epidural procedures  . Dysrhythmia   . Pneumonia   . Arthritis   . Fibromyalgia    Past Surgical History  Procedure Laterality Date  . Tonsillectomy    . Multiple extractions with alveoloplasty N/A 08/03/2014    Procedure: MULTIPLE EXTRACTIONS;  Surgeon: Gae Bon, DDS;  Location: [place];  Service: Oral Surgery;  Laterality: N/A;  . Tee without cardioversion N/A 08/17/2014    Procedure: TRANSESOPHAGEAL ECHOCARDIOGRAM (TEE);  Surgeon: Dorothy Spark, MD;  Location: [facility] ENDOSCOPY;  Service: Cardiovascular;  Laterality: N/A;   Family History  Problem Relation Age of Onset  . Diabetes Father   . Hypertension Father   . Asthma      fhx  . Hypertension      fhx  . Stroke      fhx  . Heart disease Mother    History  Substance Use Topics  . Smoking status: Current Some Day Smoker    Types: Cigars,  Cigarettes    Last Attempt to Quit: 01/29/2014  . Smokeless tobacco: Never Used     Comment: e-cigarette and cigars currently  . Alcohol Use: No     Comment: rarely    LEVEL 5 CAVEAT- Full history could not be obtained due to unresponsiveness.   Review of Systems  Unable to perform ROS Constitutional: Positive for fever.  Psychiatric/Behavioral:       Worsening unresponsiveness.    Allergies  Cefuroxime axetil; Penicillins; Tessalon; and Shellfish allergy  Home Medications   Prior to Admission medications   Medication Sig Start Date End Date Taking? Authorizing Provider  albuterol (PROVENTIL) (2.5 MG/3ML) 0.083% nebulizer solution Take 3 mLs (2.5 mg total) by nebulization every 4 (four) hours as needed for wheezing or shortness of breath. 04/27/14   [name], MD  ALPRAZolam Duanne Moron) 1 MG tablet Take 1 tablet (1 mg total) by mouth every 6 (six) hours as needed for anxiety. 08/27/14   [name], MD  Amino Acids-Protein Hydrolys (FEEDING SUPPLEMENT, PRO-STAT SUGAR FREE 64,) LIQD Take 30 mLs by mouth daily. 08/24/14   Orson Eva, MD  amLODipine (NORVASC) 2.5 MG tablet Take 1 tablet (2.5 mg total) by mouth daily. 07/30/14   Shanker Kristeen Mans, MD  atorvastatin (  LIPITOR) 20 MG tablet Take 20 mg by mouth daily. 07/20/14   Historical Provider, MD  baclofen (LIORESAL) 10 MG tablet Take 10-20 mg by mouth 3 (three) times daily. Takes 10mg  in morning and lunchtime and takes 20mg  at bedtime    Historical Provider, MD  bisacodyl (DULCOLAX) 5 MG EC tablet Take 1 tablet (5 mg total) by mouth 2 (two) times daily as needed for moderate constipation. 09/15/14   [name], MD  collagenase (SANTYL) ointment Apply 1 application topically daily. 08/30/14   [name], MD  dexlansoprazole (DEXILANT) 60 MG capsule Take 60 mg by mouth daily.    Historical Provider, MD  diclofenac sodium (VOLTAREN) 1 % GEL Apply 2 g topically daily as needed (for pain).    Historical Provider, MD   diphenoxylate-atropine (LOMOTIL) 2.5-0.025 MG per tablet Take 1 tablet by mouth 2 (two) times daily. Patient taking differently: Take 1 tablet by mouth 2 (two) times daily as needed for diarrhea or loose stools.  07/21/14   Willia Craze, NP  feeding supplement, GLUCERNA SHAKE, (GLUCERNA SHAKE) LIQD Take 237 mLs by mouth 3 (three) times daily between meals. 08/24/14   Orson Eva, MD  fluconazole (DIFLUCAN) 200 MG tablet Take 1 tablet (200 mg total) by mouth daily. 09/15/14   [name], MD  furosemide (LASIX) 40 MG tablet Take 0.5 tablets (20 mg total) by mouth daily as needed for fluid. 07/30/14   Shanker Kristeen Mans, MD  glucagon (GLUCAGON EMERGENCY) 1 MG injection Inject 1 mg into the vein once as needed. 04/20/14   [name], MD  glucose blood (ACCU-CHEK AVIVA PLUS) test strip Use to check blood sugar 4 times per day 07/21/14   Renato Shin, MD  insulin aspart (NOVOLOG) 100 UNIT/ML injection Inject 5 Units into the skin 3 (three) times daily with meals. 07/21/14   Renato Shin, MD  insulin glargine (LANTUS) 100 UNIT/ML injection Inject 0.26 mLs (26 Units total) into the skin daily. Patient taking differently: Inject 22 Units into the skin daily.  08/24/14   Orson Eva, MD  lidocaine (XYLOCAINE) 2 % jelly Apply 1 application topically 4 (four) times daily as needed (pain).  06/05/14   Historical Provider, MD  loratadine (CLARITIN) 10 MG tablet Take 10 mg by mouth daily.     Historical Provider, MD  metaxalone (SKELAXIN) 800 MG tablet Take 800 mg by mouth 3 (three) times daily as needed for muscle spasms.    Historical Provider, MD  methocarbamol (ROBAXIN) 750 MG tablet Take 750 mg by mouth 4 (four) times daily as needed. 07/07/14   Historical Provider, MD  metoCLOPramide (REGLAN) 5 MG tablet Take 1 tablet (5 mg total) by mouth 2 (two) times a week. And as needed for nausea 07/01/14   Meredith Staggers, MD  ondansetron [facility]) 8 MG tablet Take 8 mg by mouth 2 (two) times daily as needed for  nausea or vomiting.  07/07/14   Historical Provider, MD  oxyCODONE (ROXICODONE) 15 MG immediate release tablet Take 1 tablet (15 mg total) by mouth every 6 (six) hours as needed for pain. 09/10/14   Meredith Staggers, MD  polyethylene glycol powder (GLYCOLAX/MIRALAX) powder Take 17 g by mouth daily. 09/15/14   [name], MD  pregabalin (LYRICA) 100 MG capsule Take 100-200 mg by mouth 4 (four) times daily. Take 100 mg by mouth in the morning, take 100 mg by mouth in the afternoon, take 100 mg by mouth in the evening and take  200 mg by mouth at bedtime.    Historical Provider, MD  silver sulfADIAZINE (SILVADENE) 1 % cream Apply 1 application topically 2 (two) times a week. Tuesday and thursday    Historical Provider, MD  vancomycin 1,250 mg in sodium chloride 0.9 % 250 mL Inject 1,250 mg into the vein daily. Last dose to be given on 09/24/14 08/24/14   Orson Eva, MD   Triage Vitals: BP 122/81 mmHg  Pulse 100  Temp(Src) 100 F (37.8 C) (Rectal)  Resp 0  SpO2 100% Physical Exam  Constitutional: Vital signs are normal. He appears well-developed and well-nourished.  Non-toxic appearance. He does not appear ill. No distress.  HENT:  Head: Normocephalic and atraumatic.  Nose: Nose normal.  Mouth/Throat: Oropharynx is clear and moist. Dental caries present. No oropharyngeal exudate.  Poor dentition throughout.  Eyes: Conjunctivae and EOM are normal. Pupils are equal, round, and reactive to light. No scleral icterus.  Neck: Normal range of motion. Neck supple. No tracheal deviation, no edema, no erythema and normal range of motion present. No thyroid mass and no thyromegaly present.  Cardiovascular: Regular rhythm, S1 normal, S2 normal, normal heart sounds, intact distal pulses and normal pulses.  Tachycardia present.  Exam reveals no gallop and no friction rub.   No murmur heard. Pulses:      Radial pulses are 2+ on the right side, and 2+ on the left side.       Dorsalis pedis pulses are 2+ on the  right side, and 2+ on the left side.  Pulmonary/Chest: He is in respiratory distress. He has wheezes. He has rhonchi. He has rales.  Labored breathing.  Abdominal: Soft. Normal appearance and bowel sounds are normal. He exhibits no distension, no ascites and no mass. There is no hepatosplenomegaly. There is no tenderness. There is no rebound, no guarding and no CVA tenderness.  Genitourinary:  Foley catheter in place with cloudy output.  Musculoskeletal: Normal range of motion. He exhibits no edema or tenderness.  PICC line noted in left upper extremity that does not appear infected.  Lymphadenopathy:    He has no cervical adenopathy.  Neurological: He has normal strength. No sensory deficit.  Minimal response to physical stimuli.  Skin: Skin is warm, dry and intact. No petechiae and no rash noted. He is not diaphoretic. No erythema. No pallor.  Chronic ulcerations to bilateral feet.  Nursing note and vitals reviewed.   ED Course  Procedures (including critical care time) DIAGNOSTIC STUDIES: Oxygen Saturation is 100% on RA, normal by my interpretation.   COORDINATION OF CARE: 3:34 AM- Will admit to ICU and start IV antibiotics. Will order blood cultures. Pt verbalizes understanding and agrees to plan.  INTUBATION Performed by: Everlene Balls  Required items: required blood products, implants, devices, and special equipment available Patient identity confirmed: provided demographic data and hospital-assigned identification number Time out: Immediately prior to procedure a "time out" was called to verify the correct patient, procedure, equipment, support staff and site/side marked as required.  Indications: Respiratory failure, altered mental status   Intubation method: Direct laryngoscopy    Preoxygenation: 100% nasal cannula and 15L BVM  Sedatives: 20 mg Etomidate Paralytic: 80 mg Rocuronium  Tube Size: 8-0 cuffed  Post-procedure assessment: chest rise and ETCO2  monitor Breath sounds: equal and absent over the epigastrium Tube secured with: ETT holder Chest x-ray interpreted by radiologist and me.  Chest x-ray findings: endotracheal tube in appropriate position  Patient tolerated the procedure well with no immediate complications.  CRITICAL CARE Performed by: Everlene Balls, MD  Total critical care time: 40 min  Critical care time was exclusive of separately billable procedures and treating other patients.  Critical care was necessary to treat or prevent imminent or life-threatening deterioration.  Critical care was time spent personally by me on the following activities: development of treatment plan with patient and/or surrogate as well as nursing, discussions with consultants, evaluation of patient's response to treatment, examination of patient, obtaining history from patient or surrogate, ordering and performing treatments and interventions, ordering and review of laboratory studies, ordering and review of radiographic studies, pulse oximetry and re-evaluation of patient's condition.    Medications  vancomycin (VANCOCIN) IVPB 1000 mg/200 mL premix (not administered)  levofloxacin (LEVAQUIN) IVPB 750 mg (750 mg Intravenous New Bag/Given 09/16/14 0401)  sodium chloride 0.9 % bolus 1,000 mL (1,000 mLs Intravenous New Bag/Given 09/16/14 0408)  etomidate (AMIDATE) injection (20 mg Intravenous Given 09/16/14 0329)  rocuronium (ZEMURON) injection (80 mg Intravenous Given 09/16/14 0329)    Labs Review Labs Reviewed  CBC WITH DIFFERENTIAL - Abnormal; Notable for the following:    WBC 31.9 (*)    RBC 2.97 (*)    Hemoglobin 7.9 (*)    HCT 24.8 (*)    RDW 15.7 (*)    Platelets 527 (*)    Neutrophils Relative % 85 (*)    Lymphocytes Relative 10 (*)    Neutro Abs 27.1 (*)    Monocytes Absolute 1.6 (*)    All other components within normal limits  COMPREHENSIVE METABOLIC PANEL - Abnormal; Notable for the following:    Sodium 131 (*)     Chloride 95 (*)    Glucose, Bld 282 (*)    BUN 28 (*)    Creatinine, Ser 1.68 (*)    Calcium 10.6 (*)    Albumin 2.4 (*)    AST 223 (*)    ALT 113 (*)    Alkaline Phosphatase 120 (*)    Total Bilirubin <0.2 (*)    GFR calc non Af Amer 53 (*)    GFR calc Af Amer 62 (*)    All other components within normal limits  URINALYSIS, ROUTINE W REFLEX MICROSCOPIC - Abnormal; Notable for the following:    APPearance TURBID (*)    Glucose, UA 250 (*)    Hgb urine dipstick LARGE (*)    Ketones, ur 15 (*)    Protein, ur 100 (*)    Leukocytes, UA LARGE (*)    All other components within normal limits  URINE MICROSCOPIC-ADD ON - Abnormal; Notable for the following:    Bacteria, UA MANY (*)    Casts GRANULAR CAST (*)    All other components within normal limits  I-STAT CHEM 8, ED - Abnormal; Notable for the following:    Sodium 136 (*)    BUN 27 (*)    Creatinine, Ser 2.00 (*)    Glucose, Bld 299 (*)    Calcium, Ion 1.41 (*)    Hemoglobin 9.2 (*)    HCT 27.0 (*)    All other components within normal limits  I-STAT ARTERIAL BLOOD GAS, ED - Abnormal; Notable for the following:    pO2, Arterial 440.0 (*)    Acid-base deficit 3.0 (*)    All other components within normal limits  CULTURE, BLOOD (ROUTINE X 2)  CULTURE, BLOOD (ROUTINE X 2)  URINE CULTURE  BLOOD GAS, ARTERIAL  I-STAT CG4 LACTIC ACID, ED    Imaging Review Dg Chest Portable  1 View  09/16/2014   CLINICAL DATA:  Assess endotracheal tube.  EXAM: PORTABLE CHEST - 1 VIEW  COMPARISON:  Chest radiograph August 19, 2014  FINDINGS: Endotracheal tube tip projects 4 cm above the carina. LEFT PICC distal tip projects at distal superior vena cava. No pneumothorax. Patchy RIGHT middle lobe and retrocardiac airspace opacities. No pleural effusions. Soft tissue planes and included osseous structures are nonsuspicious.  Nasogastric tube tip projects in proximal stomach, side port projects in distal esophagus.  IMPRESSION: Endotracheal tube tip  projects 4 cm above the carina. LEFT PICC distal tip projects at distal superior vena cava. Nasogastric tube side port projects in distal esophagus.  RIGHT middle lobe, retrocardiac presumed pneumonia.   Electronically Signed   By: Elon Alas   On: 09/16/2014 03:59     EKG Interpretation None      MDM   Final diagnoses:  Endotracheally intubated    Patient since emergency department for severe respiratory distress and altered mental status. He was 90% on room air, he cannot follow commands and only responds to physical stimuli minimally. Patient was emergently intubated in the emergency department. He was given broad-spectrum antibiotics including clindamycin and Levaquin. He has an allergy to penicillin and cephalosporins thus gram-negative coverage was not given. This will be discussed with the intensive care unit. Patient's chest x-ray shows a middle lobe pneumonia on the right. Mom also complains that he said he had neck pain, meningitis is also a possibility. Patient's hemodynamics remained stable throughout aside from mild tachycardia. Patient settings were adjusted to decrease FiO2 from ABG results. Patient will be admitted to the intensive care unit for continued treatment.  I personally performed the services described in this documentation, which was scribed in my presence. The recorded information has been reviewed and is accurate.    Everlene Balls, MD 09/16/14 [phone]

## 2014-09-16 NOTE — ED Notes (Signed)
Per mother, pt was c/o neck pain today and weakness in which he was having a hard time sitting up. Mother reports vancomycin doses were decreased on 11/30 due to increased vanc trough; dose was recently increased again.

## 2014-09-17 ENCOUNTER — Inpatient Hospital Stay (HOSPITAL_COMMUNITY): Payer: Medicaid Other

## 2014-09-17 DIAGNOSIS — J189 Pneumonia, unspecified organism: Secondary | ICD-10-CM

## 2014-09-17 DIAGNOSIS — J9601 Acute respiratory failure with hypoxia: Secondary | ICD-10-CM | POA: Diagnosis present

## 2014-09-17 DIAGNOSIS — E1041 Type 1 diabetes mellitus with diabetic mononeuropathy: Secondary | ICD-10-CM

## 2014-09-17 DIAGNOSIS — M869 Osteomyelitis, unspecified: Secondary | ICD-10-CM

## 2014-09-17 LAB — CBC
HCT: 23 % — ABNORMAL LOW (ref 39.0–52.0)
Hemoglobin: 7.4 g/dL — ABNORMAL LOW (ref 13.0–17.0)
MCH: 26.1 pg (ref 26.0–34.0)
MCHC: 32.2 g/dL (ref 30.0–36.0)
MCV: 81.3 fL (ref 78.0–100.0)
PLATELETS: 506 10*3/uL — AB (ref 150–400)
RBC: 2.83 MIL/uL — ABNORMAL LOW (ref 4.22–5.81)
RDW: 15.2 % (ref 11.5–15.5)
WBC: 20.1 10*3/uL — ABNORMAL HIGH (ref 4.0–10.5)

## 2014-09-17 LAB — GLUCOSE, CAPILLARY
GLUCOSE-CAPILLARY: 106 mg/dL — AB (ref 70–99)
GLUCOSE-CAPILLARY: 127 mg/dL — AB (ref 70–99)
GLUCOSE-CAPILLARY: 133 mg/dL — AB (ref 70–99)
GLUCOSE-CAPILLARY: 85 mg/dL (ref 70–99)
Glucose-Capillary: 128 mg/dL — ABNORMAL HIGH (ref 70–99)
Glucose-Capillary: 63 mg/dL — ABNORMAL LOW (ref 70–99)
Glucose-Capillary: 65 mg/dL — ABNORMAL LOW (ref 70–99)
Glucose-Capillary: 99 mg/dL (ref 70–99)
Glucose-Capillary: 94 mg/dL (ref 70–99)
Glucose-Capillary: 137 mg/dL — ABNORMAL HIGH (ref 70–99)

## 2014-09-17 LAB — PHOSPHORUS: Phosphorus: 2.8 mg/dL (ref 2.3–4.6)

## 2014-09-17 LAB — COMPREHENSIVE METABOLIC PANEL
ALT: 93 U/L — ABNORMAL HIGH (ref 0–53)
AST: 84 U/L — ABNORMAL HIGH (ref 0–37)
Albumin: 2 g/dL — ABNORMAL LOW (ref 3.5–5.2)
Alkaline Phosphatase: 88 U/L (ref 39–117)
Anion gap: 12 (ref 5–15)
BILIRUBIN TOTAL: 0.2 mg/dL — AB (ref 0.3–1.2)
BUN: 17 mg/dL (ref 6–23)
CALCIUM: 9.7 mg/dL (ref 8.4–10.5)
CO2: 22 meq/L (ref 19–32)
CREATININE: 0.88 mg/dL (ref 0.50–1.35)
Chloride: 110 mEq/L (ref 96–112)
GLUCOSE: 83 mg/dL (ref 70–99)
Potassium: 3.5 mEq/L — ABNORMAL LOW (ref 3.7–5.3)
Sodium: 144 mEq/L (ref 137–147)
Total Protein: 6.8 g/dL (ref 6.0–8.3)

## 2014-09-17 LAB — VANCOMYCIN, RANDOM: Vancomycin Rm: 15 ug/mL

## 2014-09-17 LAB — MAGNESIUM
MAGNESIUM: 1.4 mg/dL — AB (ref 1.5–2.5)
Magnesium: 2.3 mg/dL (ref 1.5–2.5)

## 2014-09-17 LAB — LACTIC ACID, PLASMA: LACTIC ACID, VENOUS: 0.9 mmol/L (ref 0.5–2.2)

## 2014-09-17 MED ORDER — VANCOMYCIN HCL IN DEXTROSE 1-5 GM/200ML-% IV SOLN
1000.0000 mg | INTRAVENOUS | Status: DC
  Administered 2014-09-17 – 2014-09-19 (×3): 1000 mg via INTRAVENOUS
  Filled 2014-09-17 (×3): qty 200

## 2014-09-17 MED ORDER — MAGNESIUM SULFATE 2 GM/50ML IV SOLN
INTRAVENOUS | Status: AC
  Filled 2014-09-17: qty 50

## 2014-09-17 MED ORDER — PREGABALIN 75 MG PO CAPS
200.0000 mg | ORAL_CAPSULE | Freq: Every day | ORAL | Status: DC
  Administered 2014-09-17 – 2014-09-24 (×6): 200 mg
  Filled 2014-09-17 (×3): qty 2
  Filled 2014-09-17: qty 1
  Filled 2014-09-17 (×5): qty 2

## 2014-09-17 MED ORDER — PREGABALIN 50 MG PO CAPS: 100.0000 mg | ORAL_CAPSULE | Freq: Four times a day (QID) | ORAL | Status: DC

## 2014-09-17 MED ORDER — SODIUM CHLORIDE 0.9 % IV SOLN
6.0000 g | Freq: Once | INTRAVENOUS | Status: AC
  Administered 2014-09-17: 6 g via INTRAVENOUS
  Filled 2014-09-17: qty 12

## 2014-09-17 MED ORDER — PREGABALIN 75 MG PO CAPS
100.0000 mg | ORAL_CAPSULE | ORAL | Status: DC
  Administered 2014-09-17 – 2014-09-24 (×19): 100 mg
  Filled 2014-09-17 (×5): qty 2
  Filled 2014-09-17 (×9): qty 1
  Filled 2014-09-17 (×2): qty 2
  Filled 2014-09-17: qty 1
  Filled 2014-09-17: qty 2
  Filled 2014-09-17 (×5): qty 1
  Filled 2014-09-17: qty 2
  Filled 2014-09-17 (×2): qty 1
  Filled 2014-09-17 (×2): qty 2
  Filled 2014-09-17: qty 1
  Filled 2014-09-17: qty 2
  Filled 2014-09-17: qty 1
  Filled 2014-09-17: qty 2
  Filled 2014-09-17: qty 1

## 2014-09-17 NOTE — Progress Notes (Signed)
Pt biting ett, bite block placed and taped in place.

## 2014-09-17 NOTE — Progress Notes (Signed)
K+ not replaced due to allergy alert

## 2014-09-17 NOTE — Progress Notes (Signed)
  Echocardiogram 2D Echocardiogram has been performed.  Jason [name] M 09/17/2014, 1:51 PM

## 2014-09-17 NOTE — Progress Notes (Signed)
PULMONARY / CRITICAL CARE MEDICINE   Name: Jason [name] MRN: [id number] DOB: Apr 21, 1986    ADMISSION DATE:  09/16/2014 CONSULTATION DATE:  121/0/2015  REFERRING MD :  EDP Oni  CHIEF COMPLAINT:  AMS  INITIAL PRESENTATION: 28 year old with complex medical history, paraplegic, presented to [facility] ED 12/10 with altered mental status and fevers. He was unresponsive in ED requiring intubation. He was noted to have profound leukocytosis,started on ABX and PCCM was asked to see for admission.   STUDIES:   SIGNIFICANT EVENTS:  SUBJECTIVE: Sedated this am, no c/o  VITAL SIGNS: Temp:  [97.2 F (36.2 C)-98.1 F (36.7 C)] 97.2 F (36.2 C) (12/11 0744) Pulse Rate:  [66-80] 73 (12/11 0720) Resp:  [0-22] 22 (12/11 0720) BP: (94-137)/(57-87) 137/87 mmHg (12/11 0720) SpO2:  [98 %-100 %] 100 % (12/11 0720) FiO2 (%):  [40 %-50 %] 40 % (12/11 0720) Weight:  [56.473 kg (124 lb 8 oz)] 56.473 kg (124 lb 8 oz) (12/11 0500) HEMODYNAMICS:   VENTILATOR SETTINGS: Vent Mode:  [-] PRVC FiO2 (%):  [40 %-50 %] 40 % Set Rate:  [12 bmp-16 bmp] 12 bmp Vt Set:  [530 mL] 530 mL PEEP:  [5 cmH20] 5 cmH20 Plateau Pressure:  [18 cmH20-21 cmH20] 20 cmH20 INTAKE / OUTPUT:  Intake/Output Summary (Last 24 hours) at 09/17/14 0936 Last data filed at 09/17/14 0700  Gross per 24 hour  Intake 2381.39 ml  Output   1875 ml  Net 506.39 ml    PHYSICAL EXAMINATION: General:  Young thin male in NAD on vent.  Neuro:  Unresponsive on vent HEENT:  [place]/AT, PERRL, No JVD noted Cardiovascular:  Borderline tachy, regular, no MRG Lungs:  Coarse bilateral breath sounds. Respirations even, unlabored Abdomen:  Soft, non-distended. BS hypoactive x 4.  Musculoskeletal:  No acute deformity.  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    LABS:  CBC  Recent Labs Lab 09/16/14 0335 09/16/14 0342 09/17/14 0427  WBC 31.9*  --  20.1*  HGB 7.9* 9.2* 7.4*  HCT 24.8* 27.0* 23.0*  PLT  527*  --  506*   Coag's No results for input(s): APTT, INR in the last 168 hours. BMET  Recent Labs Lab 09/16/14 0335 09/16/14 0342 09/17/14 0427  NA 131* 136* 144  K 5.1 5.2 3.5*  CL 95* 103 110  CO2 21  --  22  BUN 28* 27* 17  CREATININE 1.68* 2.00* 0.88  GLUCOSE 282* 299* 83   Electrolytes  Recent Labs Lab 09/16/14 0335 09/17/14 0427  CALCIUM 10.6* 9.7  MG 1.6 1.4*  PHOS 5.5* 2.8   Sepsis Markers  Recent Labs Lab 09/16/14 0335 09/16/14 0342 09/16/14 0626 09/17/14 0428  LATICACIDVEN  --  1.07 1.66 0.9  PROCALCITON 5.63  --   --   --    ABG  Recent Labs Lab 09/16/14 0424  PHART 7.351  PCO2ART 40.2  PO2ART 440.0*   Liver Enzymes  Recent Labs Lab 09/16/14 0335 09/17/14 0427  AST 223* 84*  ALT 113* 93*  ALKPHOS 120* 88  BILITOT <0.2* 0.2*  ALBUMIN 2.4* 2.0*   Cardiac Enzymes No results for input(s): TROPONINI, PROBNP in the last 168 hours. Glucose  Recent Labs Lab 09/16/14 2305 09/16/14 2323 09/17/14 0027 09/17/14 0130 09/17/14 0332 09/17/14 0742  GLUCAP 63* 128* 106* 99 85 94    Imaging Mr Pelvis W Wo Contrast  09/17/2014   CLINICAL DATA:  Osteomyelitis  EXAM: MRI PELVIS WITHOUT  AND WITH CONTRAST  TECHNIQUE: Multiplanar multisequence MR imaging of the pelvis was performed both before and after administration of intravenous contrast.  CONTRAST:  36mL MULTIHANCE GADOBENATE DIMEGLUMINE 529 MG/ML IV SOLN  COMPARISON:  08/17/2014  FINDINGS: There is no focal marrow signal abnormality of bilateral hips. There is no fracture, dislocation or avascular necrosis. Normal bilateral sacroiliac joints. No joint effusion. No gross labral or paralabral abnormality.  Decubitus ulcer overlying the inferior aspect of the right ischial tuberosity with a small amount of air within the ulcer extending down to the ischial tuberosity. There is no cortical destruction. There is mild marrow edema and enhancement of the ischial tuberosity and extending into the right  inferior pubic ramus. There is a 5 x 4.7 x 6 cm complex multiloculated peripherally enhancing fluid collection extending from the decubitus ulcer down to the ischial tuberosity and surrounding the ischial tuberosity.  There is diffuse increased T2 signal throughout the pelvic musculature. There is mild soft tissue edema within the subcutaneous fat diffusely throughout the pelvis.  There is diffuse bladder wall thickening with a Foley catheter present.  There is a large amount stool within the rectosigmoid colon.  IMPRESSION: 1. Decubitus ulcer overlying the right ischial tuberosity with a small amount of air within the ulcer. There is a 5 x 4.7 x 6 cm complex multiloculated peripherally enhancing fluid collection excision of a decubitus ulcer down to the ischial tuberosity and surrounding the ischial tuberosity with underlying mild marrow edema and enhancement without definite cortical destruction of the ischial tuberosity extending into the inferior pubic ramus. The complex fluid collection is enlarged and more loculated compared with the prior examination most consistent with an abscess. The osseous changes are mildly increased compared with the prior examination concerning for osteomyelitis versus reactive changes given the lack of cortical destruction. 2. Diffuse muscle edema throughout the pelvic musculature. The appearance is most consistent with nonspecific myositis which may be secondary to neurogenic or infectious etiology.   Electronically Signed   By: Kathreen Devoid   On: 09/17/2014 08:33   Mr Foot Left W Wo Contrast  09/17/2014   CLINICAL DATA:  Osteomyelitis of the foot. Initial encounter. Multiple foot ulcerations, most profound at the fifth toe.  EXAM: MRI OF THE LEFT FOREFOOT WITHOUT AND WITH CONTRAST  TECHNIQUE: Multiplanar, multisequence MR imaging was performed both before and after administration of intravenous contrast.  CONTRAST:  1mL MULTIHANCE GADOBENATE DIMEGLUMINE 529 MG/ML IV SOLN   COMPARISON:  None recent.  Radiographs 01/29/2014.  FINDINGS: There is no effacement of fatty marrow or bone marrow edema/enhancement to suggest osteomyelitis. There is abnormal marrow signal in the talar head and neck. 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 In the tibial plafond, there is enhancing T2 hyperintense line that extends transversely and is suspicious for an insufficiency fracture however this may represent an old healed fracture is well.  Phlegmon is present over the calcaneus on post gadolinium images compatible with soft tissue infection but there is no calcaneal osteomyelitis. There is loss of fat saturation along the small toe on the post gadolinium images. Plantar foot musculature shows diffuse atrophy and edema, likely associated with denervation.  IMPRESSION: 1. No evidence of osteomyelitis of the foot. 2. Phlegmon over the calcaneus, without osteomyelitis of the calcaneus. 3. Sclerosis of the head and neck of the talus, most compatible with AVN. This could also be posttraumatic. 4. Incomplete fracture plane through the tibial plafond suspicious for recent fracture  however this may represent residual abnormal signal from prior fracture.   Electronically Signed   By: Dereck Ligas M.D.   On: 09/17/2014 07:25   Dg Chest Portable 1 View  09/16/2014   CLINICAL DATA:  Assess endotracheal tube.  EXAM: PORTABLE CHEST - 1 VIEW  COMPARISON:  Chest radiograph August 19, 2014  FINDINGS: Endotracheal tube tip projects 4 cm above the carina. LEFT PICC distal tip projects at distal superior vena cava. No pneumothorax. Patchy RIGHT middle lobe and retrocardiac airspace opacities. No pleural effusions. Soft tissue planes and included osseous structures are nonsuspicious.  Nasogastric tube tip projects in proximal stomach, side port projects in distal esophagus.  IMPRESSION: Endotracheal tube tip projects 4 cm above the carina. LEFT PICC distal tip projects  at distal superior vena cava. Nasogastric tube side port projects in distal esophagus.  RIGHT middle lobe, retrocardiac presumed pneumonia.   Electronically Signed   By: Elon Alas   On: 09/16/2014 03:59   Dg Abd Portable 1v  09/16/2014   CLINICAL DATA:  OG tube placement.  EXAM: PORTABLE ABDOMEN - 1 VIEW  COMPARISON:  05/28/2014  FINDINGS: Limited field of view abdomen demonstrates enteric tube with tip to the right of midline consistent with location in the distal stomach. Additional tubing projected over the left upper quadrant may represent a percutaneous gastrostomy tube. This could also represent superficial structure. Visualized bowel gas pattern appears normal.  IMPRESSION: Enteric tube tip localizes over the lower stomach.   Electronically Signed   By: Lucienne Capers M.D.   On: 09/16/2014 21:24     ASSESSMENT / PLAN:  PULMONARY OETT 12/10 > A: Acute hypoxic respiratory failure, in part intubated due to altered MS ? RML HCAP  P:   Full vent support, has tolerated PSV poorly thus far, ? Whether this wil be a prolonged vent wean. May need to broach subject of trach if no change Follow CXR  VAP prevention per protocol PRN albuterol  CARDIOVASCULAR CVL LUE PICC 12/10 > A:  Hypotension 2/2 severe sepsis, improved with IVF H/o HTN P:  Tele MAP goal > 87mm/Hg IVF hydration Hold amlodipine, statin, lasix  RENAL A:   AKI in setting sepsis, improved P:   Hydrate Follow Bmet  GASTROINTESTINAL A:   Transaminitis, likely shock liver, improved.  - 12/10 Abd [name] with ? Sludge, no other acute findings P:   NPO, start TF 12/11 SUP: IV Protonix   HEMATOLOGIC A:   Chronic anemia (negative workup on last admission)  P:  Follow CBC Hold VTE chemoprophylaxis in setting of anemia SCDs Transfuse per normal ICU guidelines  INFECTIOUS A:   Severe sepsis - potential etiologies include known MSSA bacteremia, HCAP, UTI (mother states usually fungal), osteomyelitis.  Note also transaminitis, but Abd [name] reassuring 12/10. MRI sacrum with probable osteomyelitis 12/10  P:   BCx2 12/10> UC 12/10> Sputum 12/10>  Abx: Vancomycin, preadmission >>> Abx: Meropenem, start date 12/10, day 2 Diflucan: start date 12/10, day 2  Wound care consulting on sacral wound, L foot wound.  Abx as above Fungal coverage since freq hx fungal UTIs Will likely need surgical eval for the sacral wound, will discuss this with wound care  ENDOCRINE A:   DM with hyperglycemia    P:   CBG monitoring and SSI Holding home lantus  NEUROLOGIC A:   Acute metabolic encephalopathy in setting of sepsis H/o seizures, CVA  P:   RASS goal: -1 to 0 Fentanyl gtt and midazolam PRN per PAD  protocol Monitor Holding preadmission xanax, baclofen,  Restart lyrica on 12/11   FAMILY  - Updates: no family available on 12/11  - Inter-disciplinary family meet or Palliative Care meeting due by:  12/17   Independent CC time 40 minutes.   Baltazar Apo, MD, PhD 09/17/2014, 9:36 AM [facility] [phone] or if no answer [phone]

## 2014-09-17 NOTE — Consult Note (Addendum)
WOC follow-up: MRI indicates possible osteomylitis to right ischium unstageable wound, as well as fluid and air collections.  These complex medical problems are beyond [facility] scope of practice.  Please consult [facility] for further plan of care. Please re-consult if further assistance is needed.  Thank-you,  Julien Girt MSN, [place], Eugenio Saenz, [place], [place]

## 2014-09-17 NOTE — Progress Notes (Signed)
[facility]  Patient Status:  Active pt with [facility] prior to this admission  [facility] is providing the following services: HHRN and Home infusion pharmacy for home IV ABX.  [facility] hospital team will follow and support DC when ordered.  If patient discharges after hours, please call [phone].   Jason [name] 09/17/2014, 2:25 PM

## 2014-09-17 NOTE — Progress Notes (Addendum)
ANTIBIOTIC CONSULT NOTE - Follow-up  Pharmacy Consult for Vancomycin Indication: rule out sepsis, UTI  Allergies  Allergen Reactions  . Cefuroxime Axetil Anaphylaxis  . Penicillins Anaphylaxis and Other (See Comments)    ?can take amoxicillin?  [name] [Benzonatate] Anaphylaxis  . Shellfish Allergy Itching and Other (See Comments)    Took benadryl to alleviate reaction    Vital Signs: Temp: 97.2 F (36.2 C) (12/11 0744) Temp Source: Oral (12/11 0744) BP: 137/87 mmHg (12/11 0720) Pulse Rate: 73 (12/11 0720) Intake/Output from previous day: 12/10 0701 - 12/11 0700 In: 2731.4 [I.V.:1946.8; NG/GT:284.6; IV Piggyback:500] Out: 1875 [Urine:1875] Intake/Output from this shift:    Labs:  Recent Labs  09/16/14 0335 09/16/14 0342 09/17/14 0427  WBC 31.9*  --  20.1*  HGB 7.9* 9.2* 7.4*  PLT 527*  --  506*  CREATININE 1.68* 2.00* 0.88   Assessment: 28 y/o paraplegic male intubated in the ED due to AMS, continues on broad-spectrum antibiotics. Initial vancomycin level was elevated due worsening renal function with a Scr of 2. Scr has drastically improved today to 0.88 and a random vancomycin level this AM is 15. Pt is afebrile, WBC remains elevated but is trending down. Cultures are pending. An appropriate vancomycin dose is difficult to determine due to fluctuating renal function. MRI of foot was negative for osteomyelitis but pelvic MRI positive for osteo.   Vanc PTA>> Flucon PTA>> Meropenem 12/10>>  12/10 VR = 27 12/11 VR = 15  Goal of Therapy:  Vancomycin trough level 15-20 mcg/ml  Plan:  1. Vancomycin 1gm IV Q24H 2. Continue other antibiotics as ordered 3. F/u renal fxn, C&S, clinical status and trough at [facility], PharmD, BCPS Pager # [phone] 09/17/2014 7:47 AM

## 2014-09-18 DIAGNOSIS — R7881 Bacteremia: Secondary | ICD-10-CM

## 2014-09-18 DIAGNOSIS — Z4659 Encounter for fitting and adjustment of other gastrointestinal appliance and device: Secondary | ICD-10-CM

## 2014-09-18 LAB — CBC
HCT: 22.4 % — ABNORMAL LOW (ref 39.0–52.0)
Hemoglobin: 7.2 g/dL — ABNORMAL LOW (ref 13.0–17.0)
MCH: 26.5 pg (ref 26.0–34.0)
MCHC: 32.1 g/dL (ref 30.0–36.0)
MCV: 82.4 fL (ref 78.0–100.0)
Platelets: 534 10*3/uL — ABNORMAL HIGH (ref 150–400)
RBC: 2.72 MIL/uL — AB (ref 4.22–5.81)
RDW: 15.7 % — ABNORMAL HIGH (ref 11.5–15.5)
WBC: 20.5 10*3/uL — ABNORMAL HIGH (ref 4.0–10.5)

## 2014-09-18 LAB — BASIC METABOLIC PANEL
Anion gap: 9 (ref 5–15)
BUN: 12 mg/dL (ref 6–23)
CO2: 21 meq/L (ref 19–32)
Calcium: 9.3 mg/dL (ref 8.4–10.5)
Chloride: 113 mEq/L — ABNORMAL HIGH (ref 96–112)
Creatinine, Ser: 0.65 mg/dL (ref 0.50–1.35)
GFR calc Af Amer: >90 mL/min (ref >90)
GFR calc non Af Amer: >90 mL/min (ref >90)
GLUCOSE: 158 mg/dL — AB (ref 70–99)
POTASSIUM: 3.3 meq/L — AB (ref 3.7–5.3)
Sodium: 143 mEq/L (ref 137–147)

## 2014-09-18 LAB — GLUCOSE, CAPILLARY
GLUCOSE-CAPILLARY: 114 mg/dL — AB (ref 70–99)
GLUCOSE-CAPILLARY: 137 mg/dL — AB (ref 70–99)
GLUCOSE-CAPILLARY: 154 mg/dL — AB (ref 70–99)
Glucose-Capillary: 155 mg/dL — ABNORMAL HIGH (ref 70–99)
Glucose-Capillary: 171 mg/dL — ABNORMAL HIGH (ref 70–99)
Glucose-Capillary: 244 mg/dL — ABNORMAL HIGH (ref 70–99)

## 2014-09-18 LAB — URINE CULTURE

## 2014-09-18 MED ORDER — POTASSIUM CHLORIDE 20 MEQ/15ML (10%) PO SOLN
40.0000 meq | Freq: Once | ORAL | Status: AC
  Administered 2014-09-18: 40 meq
  Filled 2014-09-18 (×2): qty 30

## 2014-09-18 MED ORDER — SODIUM CHLORIDE 0.9 % IJ SOLN: 10.0000 mL | INTRAMUSCULAR | Status: DC | PRN

## 2014-09-18 MED ORDER — SODIUM CHLORIDE 0.9 % IV BOLUS (SEPSIS)
500.0000 mL | Freq: Once | INTRAVENOUS | Status: AC
  Administered 2014-09-18: 500 mL via INTRAVENOUS

## 2014-09-18 MED ORDER — SODIUM CHLORIDE 0.9 % IJ SOLN
10.0000 mL | Freq: Two times a day (BID) | INTRAMUSCULAR | Status: DC
  Administered 2014-09-18 – 2014-09-24 (×13): 10 mL

## 2014-09-18 NOTE — Progress Notes (Signed)
Peripherally Inserted Central Catheter/Midline Placement  The IV Nurse has discussed with the patient and/or persons authorized to consent for the patient, the purpose of this procedure and the potential benefits and risks involved with this procedure.  The benefits include less needle sticks, lab draws from the catheter and patient may be discharged home with the catheter.  Risks include, but not limited to, infection, bleeding, blood clot (thrombus formation), and puncture of an artery; nerve damage and irregular heat beat.  Alternatives to this procedure were also discussed. Consent obtained from S.O.  PICC/Midline Placement Documentation  PICC / Midline Single Lumen 25/49/82 PICC Right Basilic 40 cm 1 cm (Active)  Indication for Insertion or Continuance of Line Prolonged intravenous therapies 09/17/2014  8:00 PM  Exposed Catheter (cm) 1 cm 09/06/2014  3:59 PM  Site Assessment Clean;Dry;Intact 09/17/2014  8:00 PM  Line Status Infusing 09/17/2014  8:00 PM  Dressing Type Transparent;Occlusive 09/17/2014  8:00 PM  Dressing Status Clean;Dry;Intact 09/17/2014  8:00 PM  Line Care Connections checked and tightened 09/17/2014  8:00 PM  Dressing Change Due 09/13/14 09/06/2014  3:59 PM     PICC Triple Lumen 09/18/14 PICC Right Brachial 39 cm 0 cm (Active)  Indication for Insertion or Continuance of Line Vasoactive infusions;Prolonged intravenous therapies;Limited venous access - need for IV therapy >5 days (PICC only) 09/18/2014 10:41 AM  Exposed Catheter (cm) 0 cm 09/18/2014 10:41 AM  Site Assessment Clean;Dry;Intact 09/18/2014 10:41 AM  Lumen #1 Status Flushed;Saline locked;Blood return noted 09/18/2014 10:41 AM  Lumen #2 Status Flushed;Saline locked;Blood return noted 09/18/2014 10:41 AM  Lumen #3 Status Flushed;Saline locked;Blood return noted 09/18/2014 10:41 AM  Dressing Type Transparent 09/18/2014 10:41 AM  Dressing Status Clean;Intact;Dry 09/18/2014 10:41 AM  Line Care Connections checked  and tightened 09/18/2014 10:41 AM  Line Adjustment (NICU/IV Team Only) No 09/18/2014 10:41 AM  Dressing Intervention New dressing 09/18/2014 10:41 AM  Dressing Change Due 09/25/14 09/18/2014 10:41 AM       Rolena Infante 09/18/2014, 10:42 AM

## 2014-09-18 NOTE — Progress Notes (Signed)
PULMONARY / CRITICAL CARE MEDICINE   Name: Jason [name] MRN: [id number] DOB: January 09, 1986    ADMISSION DATE:  09/16/2014 CONSULTATION DATE:  121/0/2015  REFERRING MD :  EDP Oni  CHIEF COMPLAINT:  AMS  INITIAL PRESENTATION: 28 year old with complex medical history, paraplegic, recent admission for MSSA bacteremia, sent home on IV abx with PICC on 11/19.  Returned to [facility] ED 12/10 with altered mental status and fevers. He was unresponsive in ED requiring intubation. He was noted to have profound leukocytosis,started on ABX and PCCM was asked to see for admission.   STUDIES:  2D echo 12/12>>>  SIGNIFICANT EVENTS:  SUBJECTIVE: Sleepy on vent, sedation currently off.  Denies pain.   VITAL SIGNS: Temp:  [98.3 F (36.8 C)-99.5 F (37.5 C)] 98.7 F (37.1 C) (12/12 0725) Pulse Rate:  [71-100] 73 (12/12 0740) Resp:  [12-28] 20 (12/12 0740) BP: (83-121)/(54-94) 87/58 mmHg (12/12 0740) SpO2:  [100 %] 100 % (12/12 0740) FiO2 (%):  [40 %] 40 % (12/12 0740) Weight:  [128 lb 1.6 oz (58.106 kg)] 128 lb 1.6 oz (58.106 kg) (12/12 0446) HEMODYNAMICS:   VENTILATOR SETTINGS: Vent Mode:  [-] CPAP;PSV FiO2 (%):  [40 %] 40 % Set Rate:  [12 bmp] 12 bmp Vt Set:  [530 mL] 530 mL PEEP:  [5 cmH20] 5 cmH20 Pressure Support:  [10 cmH20] 10 cmH20 Plateau Pressure:  [13 cmH20-25 cmH20] 25 cmH20 INTAKE / OUTPUT:  Intake/Output Summary (Last 24 hours) at 09/18/14 1034 Last data filed at 09/18/14 4098  Gross per 24 hour  Intake 4066.1 ml  Output    810 ml  Net 3256.1 ml    PHYSICAL EXAMINATION: General:  Young thin male in NAD on vent.  Neuro:  Opens eyes, nods appropriately, RASS -1, paraplegia at baseline  HEENT:  [place]/AT, PERRL, No JVD noted Cardiovascular:  Borderline tachy, regular, no MRG Lungs:  Coarse bilateral breath sounds. Respirations even, unlabored Abdomen:  Soft, non-distended. BS hypoactive x 4.  Musculoskeletal:  No acute deformity.  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    LABS:  CBC  Recent Labs Lab 09/16/14 0335 09/16/14 0342 09/17/14 0427  WBC 31.9*  --  20.1*  HGB 7.9* 9.2* 7.4*  HCT 24.8* 27.0* 23.0*  PLT 527*  --  506*   Coag's No results for input(s): APTT, INR in the last 168 hours. BMET  Recent Labs Lab 09/16/14 0335 09/16/14 0342 09/17/14 0427  NA 131* 136* 144  K 5.1 5.2 3.5*  CL 95* 103 110  CO2 21  --  22  BUN 28* 27* 17  CREATININE 1.68* 2.00* 0.88  GLUCOSE 282* 299* 83   Electrolytes  Recent Labs Lab 09/16/14 0335 09/17/14 0427 09/17/14 2000  CALCIUM 10.6* 9.7  --   MG 1.6 1.4* 2.3  PHOS 5.5* 2.8  --    Sepsis Markers  Recent Labs Lab 09/16/14 0335 09/16/14 0342 09/16/14 0626 09/17/14 0428  LATICACIDVEN  --  1.07 1.66 0.9  PROCALCITON 5.63  --   --   --    ABG  Recent Labs Lab 09/16/14 0424  PHART 7.351  PCO2ART 40.2  PO2ART 440.0*   Liver Enzymes  Recent Labs Lab 09/16/14 0335 09/17/14 0427  AST 223* 84*  ALT 113* 93*  ALKPHOS 120* 88  BILITOT <0.2* 0.2*  ALBUMIN 2.4* 2.0*   Cardiac Enzymes No results for input(s): TROPONINI, PROBNP in the last 168 hours. Glucose  Recent Labs  Lab 09/17/14 1158 09/17/14 1536 09/17/14 1916 09/17/14 2337 09/18/14 0353 09/18/14 0713  GLUCAP 133* 127* 137* 244* 171* 114*    Imaging Dg Chest Port 1 View  09/17/2014   CLINICAL DATA:  Intubation.  EXAM: PORTABLE CHEST - 1 VIEW  COMPARISON:  09/16/2014.  FINDINGS: Endotracheal tube, left subclavian line, NG tube in stable position. Progressive left lower lobe infiltrate. No pleural effusion or pneumothorax. Heart size normal.  IMPRESSION: 1. Lines and tubes in stable position.  2. Progressive left lower lobe infiltrate consistent with pneumonia.   Electronically Signed   By: Olds   On: 09/17/2014 07:46   US Abdomen Limited Ruq  09/17/2014   CLINICAL DATA:  Elevated LFTs.  EXAM: US ABDOMEN LIMITED - RIGHT UPPER QUADRANT  COMPARISON:  None.   FINDINGS: Gallbladder:  Tiny amount of sludge in the gallbladder cannot be excluded. No gallstones noted gallbladder wall thickness 1.9 mm. Negative Murphy's sign.  Common bile duct:  Diameter: 5.6 mm  Liver:  No focal lesion identified. Within normal limits in parenchymal echogenicity.  IMPRESSION: Tiny amount of sludge in the gallbladder cannot be excluded. Exam is otherwise normal. Liver echotexture normal.   Electronically Signed   By: Marcello Moores  Register   On: 09/17/2014 07:42     ASSESSMENT / PLAN:  PULMONARY OETT 12/10 >>> A: Acute hypoxic respiratory failure, in part intubated due to altered MS ? HCAP  P:   -PS wean attempts, escalate PS if needed, cpap 5, goal 2 hr  -Follow CXR for LLL -VAP prevention per protocol -PRN albuterol - abx as below  -last ABg reviewed, keep same MV on rest  CARDIOVASCULAR CVL LUE PICC (pta) >>>12/12 RUE PICC 12/12>>> A:  Hypotension 2/2 severe sepsis, improved with IVF H/o HTN P:  Tele MAP goal > 28mm/Hg IVF hydration Hold amlodipine, statin, lasix  RENAL A:   AKI in setting sepsis, improved hypoK P:   Hydrate Follow Bmet K supp  GASTROINTESTINAL A:   Transaminitis, likely shock liver, improved.  - 12/10 Abd [name] with ? Sludge, no other acute findings P:   TF to goal SUP: IV Protonix  HEMATOLOGIC A:   Chronic anemia (negative workup on last admission) P:  Follow CBC Hold VTE chemoprophylaxis in setting of anemia SCDs Transfuse per normal ICU guidelines  INFECTIOUS A:   Severe sepsis - r/t previous known MSSA bacteremia now +/- HCAP, UTI, osteomyelitis. MRI sacrum with probable osteomyelitis 12/10  P:   BCx2 12/10> UC 12/10> GNR>>> Sputum 12/10>  Vancomycin (preadmission)>>> Meropenem 12/10 >>> Diflucan12/10>>>12/12  Wound care consulting on sacral wound, L foot wound.  Abx as above Fungal coverage dc, gram neg rod noted Will likely need surgical eval for the sacral wound, will discuss this with wound  care Exchange PICC 12/12, most important Needs TEE as prior org was staff regardless of if culture pos this time  ENDOCRINE A:   DM with hyperglycemia    P:   CBG monitoring and SSI Holding home lantus  NEUROLOGIC A:   Acute metabolic encephalopathy in setting of sepsis H/o seizures, CVA  P:   RASS goal: -1 to 0 Fentanyl gtt and midazolam PRN per PAD protocol Monitor Holding preadmission xanax, baclofen,  Restart lyrica on 12/11  FAMILY  - Updates: no family available on 12/12  - Inter-disciplinary family meet or Palliative Care meeting due by:  12/17   Nickolas Madrid, NP 09/18/2014  10:34 AM Pager: [phone] or [phone]  STAFF NOTE: Denice Bors  Titus Mould, [place] have personally reviewed patient's available data, including medical history, events of note, physical examination and test results as part of my evaluation. I have discussed with resident/NP and other care providers such as pharmacist, RN and RRT. In addition, I personally evaluated patient and elicited key findings FV:OHKGOVPC, slight coarse, weaning but apnea noted, repeat wean with continued fent off, needs TEE, may need trach The patient is critically ill with multiple organ systems failure and requires high complexity decision making for assessment and support, frequent evaluation and titration of therapies, application of advanced monitoring technologies and extensive interpretation of multiple databases.   Critical Care Time devoted to patient care services described in this note is 30 Minutes. This time reflects time of care of this signee: Merrie Roof, MD FACP. This critical care time does not reflect procedure time, or teaching time or supervisory time of PA/NP/Med student/Med Resident etc but could involve care discussion time. Rest per NP/medical resident whose note is outlined above and that I agree with   Lavon Paganini. Titus Mould, MD, [place] Pgr: [facility] 09/18/2014 11:39 AM

## 2014-09-18 NOTE — Consult Note (Signed)
  I was asked to see this patient because of possible sepsis related to a decubitus wound overlying his right ischium.  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 On his sacrum he has a stage III decubitus ulcer that is contaminated with local PVCs although the patient does have a rectal tube in place. It is very difficult on examination to understand the depth of the ulcer in the right ischial. MRI scan suggested an abscess cavity associated with this ulcer. I cannot express any pus from the wound.  The ulcer on his right hip is 4 x 6 x 4 cm in size and has what appears to be pale brown fascia and tissue at the base with no evidence of granulation tissue.  The patient does have a Klebsiella urinary tract infection as most likely source of his sepsis however the ulcer on his right hip may be a secondary cause. But the sacral ulcer and the ischial ulcer there is a possibility that further debridement may be necessary.  The patient is currently getting tube feedings and having bowel movements. We will stop his tube feedings at midnight in case he requires further debridement tomorrow however this may not be necessary until later on during hospitalization if he continues to respond to the antibiotics for his urosepsis.  Kathryne Eriksson. Dahlia Bailiff, MD, [place] [phone] [facility].

## 2014-09-19 ENCOUNTER — Inpatient Hospital Stay (HOSPITAL_COMMUNITY): Payer: Medicaid Other

## 2014-09-19 DIAGNOSIS — Z789 Other specified health status: Secondary | ICD-10-CM

## 2014-09-19 DIAGNOSIS — Z978 Presence of other specified devices: Secondary | ICD-10-CM | POA: Insufficient documentation

## 2014-09-19 LAB — PREPARE RBC (CROSSMATCH)

## 2014-09-19 LAB — BASIC METABOLIC PANEL
ANION GAP: 9 (ref 5–15)
BUN: 10 mg/dL (ref 6–23)
CHLORIDE: 113 meq/L — AB (ref 96–112)
CO2: 21 mEq/L (ref 19–32)
Calcium: 9.4 mg/dL (ref 8.4–10.5)
Creatinine, Ser: 0.59 mg/dL (ref 0.50–1.35)
GFR calc non Af Amer: >90 mL/min (ref >90)
Glucose, Bld: 203 mg/dL — ABNORMAL HIGH (ref 70–99)
POTASSIUM: 3.4 meq/L — AB (ref 3.7–5.3)
Sodium: 143 mEq/L (ref 137–147)

## 2014-09-19 LAB — MAGNESIUM: MAGNESIUM: 1.5 mg/dL (ref 1.5–2.5)

## 2014-09-19 LAB — PHOSPHORUS: Phosphorus: 2.6 mg/dL (ref 2.3–4.6)

## 2014-09-19 LAB — GLUCOSE, CAPILLARY
GLUCOSE-CAPILLARY: 106 mg/dL — AB (ref 70–99)
Glucose-Capillary: 146 mg/dL — ABNORMAL HIGH (ref 70–99)
Glucose-Capillary: 159 mg/dL — ABNORMAL HIGH (ref 70–99)
Glucose-Capillary: 164 mg/dL — ABNORMAL HIGH (ref 70–99)
Glucose-Capillary: 164 mg/dL — ABNORMAL HIGH (ref 70–99)
Glucose-Capillary: 193 mg/dL — ABNORMAL HIGH (ref 70–99)

## 2014-09-19 LAB — CBC
HCT: 21.6 % — ABNORMAL LOW (ref 39.0–52.0)
Hemoglobin: 6.8 g/dL — CL (ref 13.0–17.0)
MCH: 25.9 pg — ABNORMAL LOW (ref 26.0–34.0)
MCHC: 31.5 g/dL (ref 30.0–36.0)
MCV: 82.1 fL (ref 78.0–100.0)
Platelets: 556 10*3/uL — ABNORMAL HIGH (ref 150–400)
RBC: 2.63 MIL/uL — AB (ref 4.22–5.81)
RDW: 15.8 % — ABNORMAL HIGH (ref 11.5–15.5)
WBC: 17.4 10*3/uL — ABNORMAL HIGH (ref 4.0–10.5)

## 2014-09-19 MED ORDER — POTASSIUM CHLORIDE 10 MEQ/50ML IV SOLN
10.0000 meq | INTRAVENOUS | Status: AC
  Administered 2014-09-19 (×4): 10 meq via INTRAVENOUS
  Filled 2014-09-19 (×3): qty 50

## 2014-09-19 MED ORDER — SODIUM CHLORIDE 0.9 % IV SOLN
Freq: Once | INTRAVENOUS | Status: AC
  Administered 2014-09-19: 07:00:00 via INTRAVENOUS

## 2014-09-19 MED ORDER — SODIUM CHLORIDE 0.45 % IV SOLN
INTRAVENOUS | Status: DC
  Administered 2014-09-19 – 2014-09-20 (×2): via INTRAVENOUS

## 2014-09-19 MED ORDER — FENTANYL CITRATE 0.05 MG/ML IJ SOLN
50.0000 ug | INTRAMUSCULAR | Status: DC | PRN
  Administered 2014-09-19 – 2014-09-24 (×21): 50 ug via INTRAVENOUS
  Filled 2014-09-19 (×22): qty 2

## 2014-09-19 MED ORDER — MAGNESIUM SULFATE 50 % IJ SOLN
2.0000 g | Freq: Once | INTRAVENOUS | Status: AC
  Administered 2014-09-19: 2 g via INTRAVENOUS
  Filled 2014-09-19 (×2): qty 4

## 2014-09-19 NOTE — Procedures (Signed)
Extubation Procedure Note  Patient Details:   Name: Jason [name] DOB: Feb 02, 1986 MRN: [id number]   Airway Documentation:     Evaluation  O2 sats: stable throughout Complications: No apparent complications Patient did tolerate procedure well. Bilateral Breath Sounds: Clear, Diminished Suctioning: Oral, Airway Yes  Patient extubated to [place]. No complications. Vital signs stable at this time. Patient tolerated well.  RN at bedside. RT will continue to monitor.  Mcneil Sober 09/19/2014, 3:46 PM

## 2014-09-19 NOTE — Progress Notes (Signed)
PULMONARY / CRITICAL CARE MEDICINE   Name: Jason [name] MRN: [id number] DOB: 07/15/1986    ADMISSION DATE:  09/16/2014 CONSULTATION DATE:  121/0/2015  REFERRING MD :  EDP Oni  CHIEF COMPLAINT:  AMS  INITIAL PRESENTATION: 28 year old with complex medical history, paraplegic, recent admission for MSSA bacteremia, sent home on IV abx with PICC on 11/19.  Returned to [facility] ED 12/10 with altered mental status and fevers. He was unresponsive in ED requiring intubation. He was noted to have profound leukocytosis, started on ABX and PCCM was asked to see for admission.   STUDIES:  2D echo 12/12>>>  SIGNIFICANT EVENTS:  SUBJECTIVE: More awake, tol PS 10/5.   Off pressors  VITAL SIGNS: Temp:  [98.2 F (36.8 C)-98.8 F (37.1 C)] 98.4 F (36.9 C) (12/13 0930) Pulse Rate:  [67-88] 72 (12/13 0930) Resp:  [0-27] 18 (12/13 0930) BP: (100-160)/(61-112) 116/74 mmHg (12/13 0930) SpO2:  [99 %-100 %] 100 % (12/13 0930) FiO2 (%):  [40 %] 40 % (12/13 0729) Weight:  [135 lb 9.3 oz (61.5 kg)] 135 lb 9.3 oz (61.5 kg) (12/13 0400) HEMODYNAMICS:   VENTILATOR SETTINGS: Vent Mode:  [-] CPAP;PSV FiO2 (%):  [40 %] 40 % Set Rate:  [12 bmp] 12 bmp Vt Set:  [530 mL] 530 mL PEEP:  [5 cmH20] 5 cmH20 Pressure Support:  [10 cmH20] 10 cmH20 Plateau Pressure:  [14 cmH20-20 cmH20] 14 cmH20 INTAKE / OUTPUT:  Intake/Output Summary (Last 24 hours) at 09/19/14 0943 Last data filed at 09/19/14 0930  Gross per 24 hour  Intake 4116.17 ml  Output   1830 ml  Net 2286.17 ml    PHYSICAL EXAMINATION: General:  Young thin male in NAD on vent.  Neuro:  Opens eyes, follows commands, paraplegia at baseline  HEENT:  [place]/AT, PERRL, No JVD noted Cardiovascular:  s1s2, regular, no MRG Lungs:  resps even non labored on PS 10/5.  Scattered rhonchi Abdomen:  Soft, non-distended. BS hypoactive x 4.  Musculoskeletal:  No acute deformity.  Skin:  Several pressure/diabetic wounds, primarily to L foot and stage 4 on  sacrum.    LABS:  CBC  Recent Labs Lab 09/17/14 0427 09/18/14 1055 09/19/14 0421  WBC 20.1* 20.5* 17.4*  HGB 7.4* 7.2* 6.8*  HCT 23.0* 22.4* 21.6*  PLT 506* 534* 556*   Coag's No results for input(s): APTT, INR in the last 168 hours. BMET  Recent Labs Lab 09/17/14 0427 09/18/14 1055 09/19/14 0421  NA 144 143 143  K 3.5* 3.3* 3.4*  CL 110 113* 113*  CO2 22 21 21   BUN 17 12 10   CREATININE 0.88 0.65 0.59  GLUCOSE 83 158* 203*   Electrolytes  Recent Labs Lab 09/16/14 0335 09/17/14 0427 09/17/14 2000 09/18/14 1055 09/19/14 0421  CALCIUM 10.6* 9.7  --  9.3 9.4  MG 1.6 1.4* 2.3  --  1.5  PHOS 5.5* 2.8  --   --  2.6   Sepsis Markers  Recent Labs Lab 09/16/14 0335 09/16/14 0342 09/16/14 0626 09/17/14 0428  LATICACIDVEN  --  1.07 1.66 0.9  PROCALCITON 5.63  --   --   --    ABG  Recent Labs Lab 09/16/14 0424  PHART 7.351  PCO2ART 40.2  PO2ART 440.0*   Liver Enzymes  Recent Labs Lab 09/16/14 0335 09/17/14 0427  AST 223* 84*  ALT 113* 93*  ALKPHOS 120* 88  BILITOT <0.2* 0.2*  ALBUMIN 2.4* 2.0*   Cardiac Enzymes No results for input(s): TROPONINI, PROBNP in  the last 168 hours. Glucose  Recent Labs Lab 09/18/14 1113 09/18/14 1557 09/18/14 1935 09/18/14 2352 09/19/14 0340 09/19/14 0714  GLUCAP 137* 154* 155* 146* 193* 159*    Imaging No results found.   ASSESSMENT / PLAN:  PULMONARY OETT 12/10 >>> A: Acute hypoxic respiratory failure, in part intubated due to altered MS ? HCAP less impressed  P:   -PS wean attempts, improved 12/13, to PS 5, cpap 5, goal 1 hr, assess rsbi, abg, secretion control -VAP prevention per protocol -PRN albuterol -consider trial extubation if cont tol PS and no plans OR   CARDIOVASCULAR CVL LUE PICC (pta) >>>12/12 RUE PICC 12/12>>> A:  Hypotension 2/2 severe sepsis, improved with IVF H/o HTN P:  Tele MAP goal > 60 mm/Hg IVF hydration Hold amlodipine, statin, lasix  RENAL A:   AKI in  setting sepsis, improved HypoK, mag Small hyperchloremic acidosis from saline P:   Hydrate Follow Bmet K supp PRN mag  Dc saline Use 1./2 NS  GASTROINTESTINAL A:   Transaminitis, likely shock liver, improved.  - 12/10 Abd [name] with ? Sludge, no other acute findings P:   TF to goal, held possible OR, although he has done well overnight SUP: IV Protonix  HEMATOLOGIC A:   Chronic anemia (negative workup on last admission) Now some dilution P:  Follow CBC in am  Reduce fluids Hold VTE chemoprophylaxis in setting of anemia and possible OR SCDs Transfuse per normal ICU guidelines - 2 units PRBC 12/13  INFECTIOUS A:   Severe sepsis - r/t previous known MSSA bacteremia now +/- HCAP, UTI, osteomyelitis. MRI sacrum with probable osteomyelitis 12/10  P:   BCx2 12/10> UC 12/10> GNR>>>klebsiella Sputum 12/10>  Vancomycin (preadmission)>>>12/13 Meropenem 12/10 >>> Diflucan12/10>>>12/12  Wound care consulting on sacral wound, L foot wound -- ??OR, may hold off given clinical improvement Abx as above D/c vanc 12/13 Surgery following for sacral wound  PICC exchanged 12/12 urien seems to be source, hold off TEE  Keep imipenem for sacral wound for now  ENDOCRINE A:   DM with hyperglycemia    P:   CBG monitoring and SSI Holding home lantus  NEUROLOGIC A:   Acute metabolic encephalopathy in setting of sepsis H/o seizures, CVA  P:   RASS goal: -1 to 0 Fentanyl gtt and midazolam PRN per PAD protocol Monitor Holding preadmission xanax, baclofen,  Restart lyrica on 12/11  FAMILY  - Updates: no family available on 12/13  - Inter-disciplinary family meet or Palliative Care meeting due by:  12/17   Nickolas Madrid, NP 09/19/2014  9:43 AM Pager: [phone] or [phone]   STAFF NOTE: Linwood Dibbles, MD FACP have personally reviewed patient's available data, including medical history, events of note, physical examination and test results as part of my  evaluation. I have discussed with resident/NP and other care providers such as pharmacist, RN and RRT. In addition, I personally evaluated patient and elicited key findings of: more awake, weaning well, pressors off, urien source, no tee needed, may be able to hold off OR trip this am , would like to trial extubation and not straight to trach, replace mag, k The patient is critically ill with multiple organ systems failure and requires high complexity decision making for assessment and support, frequent evaluation and titration of therapies, application of advanced monitoring technologies and extensive interpretation of multiple databases.   Critical Care Time devoted to patient care services described in this note is30 Minutes. This time reflects time of care  of this signee: Merrie Roof, MD FACP. This critical care time does not reflect procedure time, or teaching time or supervisory time of PA/NP/Med student/Med Resident etc but could involve care discussion time. Rest per NP/medical resident whose note is outlined above and that I agree with   Lavon Paganini. Titus Mould, MD, [place] Pgr: [place] [facility] 09/19/2014 10:32 AM

## 2014-09-19 NOTE — Progress Notes (Signed)
Subjective: Pt with no acute changes.   Objective: Vital signs in last 24 hours: Temp:  [98.2 F (36.8 C)-98.8 F (37.1 C)] 98.6 F (37 C) (12/13 0945) Pulse Rate:  [67-88] 73 (12/13 1000) Resp:  [0-39] 39 (12/13 1000) BP: (101-160)/(61-112) 107/63 mmHg (12/13 1000) SpO2:  [99 %-100 %] 99 % (12/13 1000) FiO2 (%):  [40 %] 40 % (12/13 0729) Weight:  [135 lb 9.3 oz (61.5 kg)] 135 lb 9.3 oz (61.5 kg) (12/13 0400) Last BM Date: 09/18/14  Intake/Output from previous day: 12/12 0701 - 12/13 0700 In: 3813.2 [I.V.:1848.2; NG/GT:1265; IV Piggyback:700] Out: 1600 [Urine:1525; Stool:75] Intake/Output this shift: Total I/O In: 915.3 [I.V.:120.3; Blood:365; NG/GT:30; IV Piggyback:400] Out: 355 [Urine:355]  General appearance: alert Incision/Wound: sacral decubs with L gluteal ulcer, no pus  Lab Results:   Recent Labs  09/18/14 1055 09/19/14 0421  WBC 20.5* 17.4*  HGB 7.2* 6.8*  HCT 22.4* 21.6*  PLT 534* 556*   BMET  Recent Labs  09/18/14 1055 09/19/14 0421  NA 143 143  K 3.3* 3.4*  CL 113* 113*  CO2 21 21  GLUCOSE 158* 203*  BUN 12 10  CREATININE 0.65 0.59  CALCIUM 9.3 9.4   PT/INR No results for input(s): LABPROT, INR in the last 72 hours. ABG No results for input(s): PHART, HCO3 in the last 72 hours.  Invalid input(s): PCO2, PO2  Studies/Results: Dg Chest Portable 1 View  09/19/2014   CLINICAL DATA:  Ventilator dependent respiratory failure. Sepsis. Current history of uncontrolled type 1 diabetes. Quadriplegia. Follow-up left lower lobe pneumonia.  EXAM: PORTABLE CHEST - 1 VIEW  COMPARISON:  Portable chest x-rays 09/17/2014, 09/16/2014. Two-view chest x-ray 08/19/2014.  FINDINGS: Cardiomediastinal silhouette unremarkable, unchanged. Endotracheal tube tip in satisfactory position projecting approximately 6 cm above the carina. Right arm PICC tip projects at the cavoatrial junction. Nasogastric tube courses below the diaphragm into the stomach though its tip is  not included on the image. Interval improvement in aeration in the left lower lobe since 2 days ago, with moderate airspace opacity persisting. Improved aeration at the right lung base. Lungs now clear otherwise. No visible pleural effusions.  IMPRESSION: Support apparatus satisfactory. Improved left lower lobe pneumonia, though moderate airspace opacities persist. Resolution of right basilar atelectasis. No new abnormalities.   Electronically Signed   By: Evangeline Dakin M.D.   On: 09/19/2014 08:06    Anti-infectives: Anti-infectives    Start     Dose/Rate Route Frequency Ordered Stop   09/17/14 0800  vancomycin (VANCOCIN) IVPB 1000 mg/200 mL premix  Status:  Discontinued     1,000 mg200 mL/hr over 60 Minutes Intravenous Every 24 hours 09/17/14 0743 09/19/14 1005   09/16/14 0800  fluconazole (DIFLUCAN) IVPB 200 mg     200 mg100 mL/hr over 60 Minutes Intravenous Every 24 hours 09/16/14 0649     09/16/14 0700  meropenem (MERREM) 1 g in sodium chloride 0.9 % 100 mL IVPB     1 g200 mL/hr over 30 Minutes Intravenous 3 times per day 09/16/14 0649     09/16/14 0345  vancomycin (VANCOCIN) IVPB 1000 mg/200 mL premix  Status:  Discontinued     1,000 mg200 mL/hr over 60 Minutes Intravenous  Once 09/16/14 0337 09/16/14 0641   09/16/14 0345  levofloxacin (LEVAQUIN) IVPB 750 mg     750 mg100 mL/hr over 90 Minutes Intravenous  Once 09/16/14 3151 09/16/14 7616      Assessment/Plan: 28 y/o M para with STG IV sacral decubs  -con't  with wound care - no need at this time for surgical debridement  LOS: 3 days    Rosario Jacks., [facility] 09/19/2014

## 2014-09-19 NOTE — Progress Notes (Signed)
[facility] Physician-Brief Progress Note Patient Name: Jason [name] DOB: Aug 06, 1986 MRN: [id number]   Date of Service  09/19/2014  HPI/Events of Note   Recent Labs Lab 09/17/14 0427 09/18/14 1055 09/19/14 0421  HGB 7.4* 7.2* 6.8*  HCT 23.0* 22.4* 21.6*  WBC 20.1* 20.5* 17.4*  PLT 506* 534* 556*    Recent Labs Lab 09/16/14 0335 09/16/14 0342 09/17/14 0427 09/17/14 2000 09/18/14 1055 09/19/14 0421  NA 131* 136* 144  --  143 143  K 5.1 5.2 3.5*  --  3.3* 3.4*  CL 95* 103 110  --  113* 113*  CO2 21  --  22  --  21 21  GLUCOSE 282* 299* 83  --  158* 203*  BUN 28* 27* 17  --  12 10  CREATININE 1.68* 2.00* 0.88  --  0.65 0.59  CALCIUM 10.6*  --  9.7  --  9.3 9.4  MG 1.6  --  1.4* 2.3  --  1.5  PHOS 5.5*  --  2.8  --   --  2.6   Will give 2u PRBC in prep for OR debridement later today. Also replete K+  eICU Interventions       Intervention Category Intermediate Interventions: Other:  Jason [name] S. 09/19/2014, 5:03 AM

## 2014-09-20 ENCOUNTER — Inpatient Hospital Stay (HOSPITAL_COMMUNITY): Payer: Medicaid Other

## 2014-09-20 LAB — CBC
HCT: 34.3 % — ABNORMAL LOW (ref 39.0–52.0)
Hemoglobin: 11.5 g/dL — ABNORMAL LOW (ref 13.0–17.0)
MCH: 27.9 pg (ref 26.0–34.0)
MCHC: 33.5 g/dL (ref 30.0–36.0)
MCV: 83.3 fL (ref 78.0–100.0)
PLATELETS: 586 10*3/uL — AB (ref 150–400)
RBC: 4.12 MIL/uL — ABNORMAL LOW (ref 4.22–5.81)
RDW: 15.1 % (ref 11.5–15.5)
WBC: 18.2 10*3/uL — ABNORMAL HIGH (ref 4.0–10.5)

## 2014-09-20 LAB — BASIC METABOLIC PANEL
ANION GAP: 9 (ref 5–15)
BUN: 7 mg/dL (ref 6–23)
CO2: 22 mEq/L (ref 19–32)
Calcium: 10.3 mg/dL (ref 8.4–10.5)
Chloride: 104 mEq/L (ref 96–112)
Creatinine, Ser: 0.56 mg/dL (ref 0.50–1.35)
GFR calc non Af Amer: >90 mL/min (ref >90)
Glucose, Bld: 93 mg/dL (ref 70–99)
POTASSIUM: 3.9 meq/L (ref 3.7–5.3)
Sodium: 135 mEq/L — ABNORMAL LOW (ref 137–147)

## 2014-09-20 LAB — CULTURE, RESPIRATORY W GRAM STAIN

## 2014-09-20 LAB — TYPE AND SCREEN
ABO/RH(D): O POS
ANTIBODY SCREEN: NEGATIVE
UNIT DIVISION: 0
Unit division: 0

## 2014-09-20 LAB — GLUCOSE, CAPILLARY
GLUCOSE-CAPILLARY: 191 mg/dL — AB (ref 70–99)
Glucose-Capillary: 124 mg/dL — ABNORMAL HIGH (ref 70–99)
Glucose-Capillary: 152 mg/dL — ABNORMAL HIGH (ref 70–99)
Glucose-Capillary: 153 mg/dL — ABNORMAL HIGH (ref 70–99)
Glucose-Capillary: 76 mg/dL (ref 70–99)
Glucose-Capillary: 95 mg/dL (ref 70–99)

## 2014-09-20 LAB — CULTURE, RESPIRATORY

## 2014-09-20 MED ORDER — BACLOFEN 20 MG PO TABS
20.0000 mg | ORAL_TABLET | Freq: Every day | ORAL | Status: DC
  Administered 2014-09-20 – 2014-09-23 (×4): 20 mg via ORAL
  Filled 2014-09-20 (×6): qty 1

## 2014-09-20 MED ORDER — GLUCERNA SHAKE PO LIQD
237.0000 mL | Freq: Three times a day (TID) | ORAL | Status: DC
  Administered 2014-09-20 – 2014-09-24 (×12): 237 mL via ORAL
  Filled 2014-09-20 (×2): qty 237

## 2014-09-20 MED ORDER — BACLOFEN 10 MG PO TABS
10.0000 mg | ORAL_TABLET | Freq: Two times a day (BID) | ORAL | Status: DC
  Administered 2014-09-20 – 2014-09-24 (×8): 10 mg via ORAL
  Filled 2014-09-20 (×11): qty 1

## 2014-09-20 MED ORDER — HYDROMORPHONE HCL 1 MG/ML IJ SOLN
INTRAMUSCULAR | Status: AC
  Administered 2014-09-20: 0.5 mg
  Filled 2014-09-20: qty 1

## 2014-09-20 MED ORDER — HYDROMORPHONE HCL 1 MG/ML IJ SOLN
0.5000 mg | Freq: Once | INTRAMUSCULAR | Status: AC
  Administered 2014-09-20: 0.5 mg via INTRAVENOUS

## 2014-09-20 MED ORDER — CIPROFLOXACIN IN D5W 400 MG/200ML IV SOLN
400.0000 mg | Freq: Two times a day (BID) | INTRAVENOUS | Status: DC
  Administered 2014-09-20 – 2014-09-22 (×5): 400 mg via INTRAVENOUS
  Filled 2014-09-20 (×6): qty 200

## 2014-09-20 MED ORDER — FUROSEMIDE 10 MG/ML IJ SOLN
20.0000 mg | Freq: Two times a day (BID) | INTRAMUSCULAR | Status: DC
  Administered 2014-09-20 – 2014-09-24 (×9): 20 mg via INTRAVENOUS
  Filled 2014-09-20 (×11): qty 2

## 2014-09-20 MED ORDER — ALPRAZOLAM 0.5 MG PO TABS
1.0000 mg | ORAL_TABLET | Freq: Four times a day (QID) | ORAL | Status: DC | PRN
  Administered 2014-09-21 – 2014-09-24 (×2): 1 mg via ORAL
  Filled 2014-09-20 (×2): qty 2

## 2014-09-20 NOTE — Progress Notes (Signed)
Inpatient Diabetes Program Recommendations  AACE/ADA: New Consensus Statement on Inpatient Glycemic Control (2013)  Target Ranges:  Prepandial:   less than 140 mg/dL      Peak postprandial:   less than 180 mg/dL (1-2 hours)      Critically ill patients:  140 - 180 mg/dL   Reason for Assessment: Type 1 Diabetes  Results for Jason [name], Jason [name] (MRN [id number]) as of 09/20/2014 11:01  Ref. Range 09/19/2014 19:46 09/19/2014 23:39 09/20/2014 03:38  Glucose-Capillary Latest Range: 70-99 mg/dL 164 (H) 124 (H) 76   Diabetes history: Type 1 diabetes Outpatient Diabetes medications: Lantus 26 units daily, Novolog 5 units tid with meals Current orders for Inpatient glycemic control:  Lantus 10 units daily, Novolog (ICU Glycemic Control Protocol) 2-4-6 q 4 hours  Please consider decreasing Novolog correction to sensitive tid with meals and HS scale.  Also consider adding Novolog meal coverage 3 units tid with meals (to be held if patient eats less than 50%).  Thanks, Adah Perl, RN, BC-ADM Inpatient Diabetes Coordinator Pager [phone]

## 2014-09-20 NOTE — Progress Notes (Signed)
Not done at scheduled time, pt refused due to just being given pain medication and wanted to go to sleep. Pt stated he would do flutter at next scheduled time if awake.

## 2014-09-20 NOTE — Progress Notes (Signed)
[facility]  Patient Status: Active pharmacy pt with [facility] provider- [facility] is providing the following services: [place] for IV ABX- [facility].  If patient discharges after hours, please call [phone].   Jason [name] 09/20/2014, 2:19 PM

## 2014-09-20 NOTE — Progress Notes (Signed)
Wasted 105 cc of a fentanyll gtt that was in the room upon the start of my shift. Tillman Abide, RN watched me waste the med in the sink, and the tubing was placed in the appropriate trash dispenser.

## 2014-09-20 NOTE — Significant Event (Signed)
Pt requested to leave hospital.  He was concerned about issues with his mother and was upset he wasn't getting enough pain medications.    He was able to speak with his mother over the phone, and these issues were addressed.  Explained to him the process of transitioning off continuous infusions of pain medications after extubation.  Will give 0.5 mg dilaudid IV x one.  He has his other home meds being resumed.  After addressing these issues, the pt is no agreeable to remain in hospital.  Chesley Mires, MD [place] 09/20/2014, 5:08 PM Pager:  [phone] After 3pm call: [phone]

## 2014-09-20 NOTE — Progress Notes (Signed)
PULMONARY / CRITICAL CARE MEDICINE   Name: Jason [name] MRN: [id number] DOB: 08-11-1986    ADMISSION DATE:  09/16/2014 CONSULTATION DATE:  121/0/2015  REFERRING MD :  EDP Oni  CHIEF COMPLAINT:  AMS  INITIAL PRESENTATION: 28 year old with complex medical history, paraplegic, recent admission for MSSA bacteremia, sent home on IV abx with PICC on 11/19.  Returned to [facility] ED 12/10 with altered mental status and fevers. He was unresponsive in ED requiring intubation. He was noted to have profound leukocytosis, started on ABX and PCCM was asked to see for admission.   STUDIES:  2D echo 12/12>>>  SIGNIFICANT EVENTS: 12/13 extubated  SUBJECTIVE: No vent, secretions noted  VITAL SIGNS: Temp:  [97.9 F (36.6 C)-98.4 F (36.9 C)] 98.3 F (36.8 C) (12/14 0850) Pulse Rate:  [58-76] 73 (12/14 0700) Resp:  [14-26] 18 (12/14 0700) BP: (98-141)/(61-112) 132/86 mmHg (12/14 0700) SpO2:  [88 %-100 %] 100 % (12/14 0700) FiO2 (%):  [40 %] 40 % (12/13 1155) Weight:  [63.3 kg (139 lb 8.8 oz)] 63.3 kg (139 lb 8.8 oz) (12/14 0545) HEMODYNAMICS:   VENTILATOR SETTINGS: Vent Mode:  [-] CPAP;PSV FiO2 (%):  [40 %] 40 % PEEP:  [5 cmH20] 5 cmH20 Pressure Support:  [10 cmH20] 10 cmH20 INTAKE / OUTPUT:  Intake/Output Summary (Last 24 hours) at 09/20/14 1044 Last data filed at 09/20/14 0700  Gross per 24 hour  Intake   2617 ml  Output   2495 ml  Net    122 ml    PHYSICAL EXAMINATION: General:  Young thin male in NAD on vent.  Neuro:  Para, aake HEENT:  [place]/AT, PERRL, No JVD noted Cardiovascular:  s1s2, regular, no MRG Lungs:  ronchi bases Abdomen:  Soft, non-distended. BS hypoactive x 4.  Musculoskeletal:  No acute deformity.  Skin:  No changes sacrum  LABS:  CBC  Recent Labs Lab 09/18/14 1055 09/19/14 0421 09/20/14 0500  WBC 20.5* 17.4* 18.2*  HGB 7.2* 6.8* 11.5*  HCT 22.4* 21.6* 34.3*  PLT 534* 556* 586*   Coag's No results for input(s): APTT, INR in the last 168  hours. BMET  Recent Labs Lab 09/18/14 1055 09/19/14 0421 09/20/14 0500  NA 143 143 135*  K 3.3* 3.4* 3.9  CL 113* 113* 104  CO2 21 21 22   BUN 12 10 7   CREATININE 0.65 0.59 0.56  GLUCOSE 158* 203* 93   Electrolytes  Recent Labs Lab 09/16/14 0335 09/17/14 0427 09/17/14 2000 09/18/14 1055 09/19/14 0421 09/20/14 0500  CALCIUM 10.6* 9.7  --  9.3 9.4 10.3  MG 1.6 1.4* 2.3  --  1.5  --   PHOS 5.5* 2.8  --   --  2.6  --    Sepsis Markers  Recent Labs Lab 09/16/14 0335 09/16/14 0342 09/16/14 0626 09/17/14 0428  LATICACIDVEN  --  1.07 1.66 0.9  PROCALCITON 5.63  --   --   --    ABG  Recent Labs Lab 09/16/14 0424  PHART 7.351  PCO2ART 40.2  PO2ART 440.0*   Liver Enzymes  Recent Labs Lab 09/16/14 0335 09/17/14 0427  AST 223* 84*  ALT 113* 93*  ALKPHOS 120* 88  BILITOT <0.2* 0.2*  ALBUMIN 2.4* 2.0*   Cardiac Enzymes No results for input(s): TROPONINI, PROBNP in the last 168 hours. Glucose  Recent Labs Lab 09/19/14 0714 09/19/14 1142 09/19/14 1554 09/19/14 1946 09/19/14 2339 09/20/14 0338  GLUCAP 159* 164* 106* 164* 124* 76    Imaging Dg Chest Portable  1 View  09/19/2014   CLINICAL DATA:  Ventilator dependent respiratory failure. Sepsis. Current history of uncontrolled type 1 diabetes. Quadriplegia. Follow-up left lower lobe pneumonia.  EXAM: PORTABLE CHEST - 1 VIEW  COMPARISON:  Portable chest x-rays 09/17/2014, 09/16/2014. Two-view chest x-ray 08/19/2014.  FINDINGS: Cardiomediastinal silhouette unremarkable, unchanged. Endotracheal tube tip in satisfactory position projecting approximately 6 cm above the carina. Right arm PICC tip projects at the cavoatrial junction. Nasogastric tube courses below the diaphragm into the stomach though its tip is not included on the image. Interval improvement in aeration in the left lower lobe since 2 days ago, with moderate airspace opacity persisting. Improved aeration at the right lung base. Lungs now clear  otherwise. No visible pleural effusions.  IMPRESSION: Support apparatus satisfactory. Improved left lower lobe pneumonia, though moderate airspace opacities persist. Resolution of right basilar atelectasis. No new abnormalities.   Electronically Signed   By: Evangeline Dakin M.D.   On: 09/19/2014 08:06     ASSESSMENT / PLAN:  PULMONARY OETT 12/10 >>> A: Acute hypoxic respiratory failure, in part intubated due to altered MS ? HCAP less impressed  P:   -ATx noted, repeat pcxr in am  -IS, flutter valve -chest pt -suctioning as able, NTS -consider lasix  CARDIOVASCULAR CVL LUE PICC (pta) >>>12/12 RUE PICC 12/12>>> A:  Hypotension 2/2 severe sepsis, improved with IVF H/o HTN P:  Tele Start lasix Echo awaited, last TEE reviewed  RENAL A:   AKI in setting sepsis, improved HypoK, mag Small hyperchloremic acidosis from saline P:   lasix kvo Chem in am  GASTROINTESTINAL A:   Transaminitis, likely shock liver, improved.  - 12/10 Abd [name] with ? Sludge, no other acute findings P:   ppi Feed, slp  HEMATOLOGIC A:   Chronic anemia (negative workup on last admission) Now some dilution Transfuse per normal ICU guidelines - 2 units PRBC 12/13 P:  Cbc in am  lasix SCDs  INFECTIOUS A:   Severe sepsis - r/t previous known MSSA bacteremia now +/- HCAP, UTI, osteomyelitis. MRI sacrum with probable osteomyelitis 12/10  P:   BCx2 12/10> UC 12/10> GNR>>>klebsiella Sputum 12/10>staph few, pathogen? unimpressed  Vancomycin (preadmission)>>>12/13 Meropenem 12/10 >>> Diflucan12/10>>>12/12  Surgery following for sacral wound  PICC exchanged 12/12 urien seems to be source  Narrow imi to cipro, add stop date and extend for wound  ENDOCRINE A:   DM with hyperglycemia    P:   CBG monitoring and SSI Holding home lantus, glu low. Diet needed  NEUROLOGIC A:   Acute metabolic encephalopathy in setting of sepsis H/o seizures, CVA  P:   Upright as able PT Pre  admission preadmission xanax, baclofen,  Restart lyrica on 12/11  FAMILY  - Updates: no family available on 12/13  - Inter-disciplinary family meet or Palliative Care meeting due by:  12/17  To sdu  Lavon Paganini. Titus Mould, MD, [place] Pgr: [place] [facility] 09/20/2014 10:44 AM

## 2014-09-20 NOTE — Evaluation (Signed)
Clinical/Bedside Swallow Evaluation Patient Details  Name: Jason [name] MRN: [id number] Date of Birth: 1985-10-16  Today's Date: 09/20/2014 Time: 6606-3016 SLP Time Calculation (min) (ACUTE ONLY): 16 min  Past Medical History:  Past Medical History  Diagnosis Date  . GERD (gastroesophageal reflux disease)   . Asthma   . Hx MRSA infection     on face  . Gastroparesis   . Diabetic neuropathy   . Seizures   . Stroke     spinal stroke in 4/15  . Diabetes mellitus     sees Dr. Loanne Drilling   . Family history of anesthesia complication     Pt mother can't have epidural procedures  . Dysrhythmia   . Pneumonia   . Arthritis   . Fibromyalgia    Past Surgical History:  Past Surgical History  Procedure Laterality Date  . Tonsillectomy    . Multiple extractions with alveoloplasty N/A 08/03/2014    Procedure: MULTIPLE EXTRACTIONS;  Surgeon: Gae Bon, DDS;  Location: [facility];  Service: Oral Surgery;  Laterality: N/A;  . Tee without cardioversion N/A 08/17/2014    Procedure: TRANSESOPHAGEAL ECHOCARDIOGRAM (TEE);  Surgeon: Dorothy Spark, MD;  Location: 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 Returned to [facility] ED 12/10 with altered mental status and fevers. He was unresponsive in ED requiring intubation. He was noted to have profound leukocytosis. Extubated 12/13.    Assessment / Plan / Recommendation Clinical Impression  Patient presents with a functional appearing oropharyngeal swallow without overt evidence of dysphagia, penetration, or aspiration. Strength of cough noted to be weak and confirmed weak at bedside. Question impact of poor secretions clearance on recurrence of PNA. Educated patient on increased risk of aspiration with recent intubations and positioning limitations. No SLP f/u  indicated at this time. Signing off. Regular diet initiated.    Aspiration Risk  Moderate    Diet Recommendation Regular;Thin liquid   Liquid Administration via: Cup;Straw Medication Administration: Whole meds with liquid Supervision: Staff to assist with self feeding;Full supervision/cueing for compensatory strategies Compensations: Slow rate;Small sips/bites Postural Changes and/or Swallow Maneuvers: Seated upright 90 degrees;Upright 30-60 min after meal    Other  Recommendations Oral Care Recommendations: Oral care BID   Follow Up Recommendations  None       Pertinent Vitals/Pain n/a        Swallow Study    General HPI: 28 year old with complex medical history, 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 Returned to [facility] ED 12/10 with altered mental status and fevers. He was unresponsive in ED requiring intubation. He was noted to have profound leukocytosis. Extubated 12/13.  Type of Study: Bedside swallow evaluation Previous Swallow Assessment: none noted Diet Prior to this Study: NPO Temperature Spikes Noted: No Respiratory Status: Nasal cannula History of Recent Intubation: Yes Length of Intubations (days): 3 days Date extubated: 09/19/14 Behavior/Cognition: Alert;Cooperative;Pleasant mood Oral Cavity - Dentition: Adequate natural dentition Self-Feeding Abilities: Able to feed self;Needs assist Patient Positioning: Upright in bed Baseline Vocal Quality: Clear Volitional Cough: Weak (? weak at baseline) Volitional Swallow: Able to elicit    Oral/Motor/Sensory Function Overall Oral Motor/Sensory Function: Appears within functional limits for tasks assessed   Ice Chips Ice chips: Within functional  limits   Thin Liquid Thin Liquid: Within functional limits Presentation: Cup;Self Fed;Straw    Nectar Thick Nectar Thick Liquid: Not tested   Honey Thick Honey Thick Liquid: Not tested   Puree  Puree: Within functional limits Presentation: Spoon   Solid   GO   Jason Monts MA, CCC-SLP [phone]  Solid: Within functional limits Presentation: Self Fed       Jason [name] Jason [name] 09/20/2014,10:08 AM

## 2014-09-20 NOTE — Progress Notes (Signed)
NUTRITION FOLLOW UP  Intervention:   Glucerna Shake PO TID, each supplement provides 220 kcal and 10 grams of protein  Nutrition Dx:   Increased nutrient needs related to multiple wounds as evidenced by estimated calorie and protein needs, ongoing.  Goal:   Intake to meet >90% of estimated nutrition needs, unmet.  Monitor:   PO intake, labs, weight trend, wound healing.  Assessment:   28 year old with complex medical history, paraplegic, presented to [facility] ED 12/10 with altered mental status and fevers. He was unresponsive in ED requiring intubation. He was noted to have profound leukocytosis.  Discussed patient in ICU rounds today. Patient was extubated on 12/13. TF off since extubation. Diet has been advanced to CHO modified, S/P swallow evaluation with SLP this morning. Patient needs increased protein and calories to support wound healing. Sacral ulcer with probable osteomyelitis per MRI on 12/10.  Height: Ht Readings from Last 1 Encounters:  09/16/14 5\' 8"  (1.727 m)    Weight Status:   Wt Readings from Last 1 Encounters:  09/20/14 139 lb 8.8 oz (63.3 kg)   09/16/14 123 lb 10.9 oz (56.1 kg)    Re-estimated needs:  Kcal: 2000-2200 Protein: 100-120 gm Fluid: >/= 2 L  Skin: unstageable pressure ulcer to right ischium, stage 2 pressure ulcer to sacrum; full thickness wound to left finger; several full thickness wounds to left foot  Diet Order: Diet Carb Modified   Intake/Output Summary (Last 24 hours) at 09/20/14 1306 Last data filed at 09/20/14 0700  Gross per 24 hour  Intake   1723 ml  Output   2320 ml  Net   -597 ml    Last BM: 12/12   Labs:   Recent Labs Lab 09/16/14 0335  09/17/14 0427 09/17/14 2000 09/18/14 1055 09/19/14 0421 09/20/14 0500  NA 131*  < > 144  --  143 143 135*  K 5.1  < > 3.5*  --  3.3* 3.4* 3.9  CL 95*  < > 110  --  113* 113* 104  CO2 21  --  22  --  21 21 22   BUN 28*  < > 17  --  12 10 7   CREATININE 1.68*  < > 0.88  --  0.65  0.59 0.56  CALCIUM 10.6*  --  9.7  --  9.3 9.4 10.3  MG 1.6  --  1.4* 2.3  --  1.5  --   PHOS 5.5*  --  2.8  --   --  2.6  --   GLUCOSE 282*  < > 83  --  158* 203* 93  < > = values in this interval not displayed.  CBG (last 3)   Recent Labs  09/20/14 0338 09/20/14 0759 09/20/14 1156  GLUCAP 76 95 152*    Scheduled Meds: . antiseptic oral rinse  7 mL Mouth Rinse QID  . baclofen  10 mg Oral BID WC  . baclofen  20 mg Oral QHS  . chlorhexidine  15 mL Mouth Rinse BID  . ciprofloxacin  400 mg Intravenous Q12H  . collagenase   Topical Daily  . fentaNYL  50 mcg Intravenous Once  . furosemide  20 mg Intravenous BID  . insulin aspart  2-6 Units Subcutaneous 6 times per day  . insulin glargine  10 Units Subcutaneous QHS  . meropenem (MERREM) IV  1 g Intravenous 3 times per day  . mupirocin cream   Topical BID  . pantoprazole (PROTONIX) IV  40 mg Intravenous  QHS  . pregabalin  100 mg Per Tube 3 times per day  . pregabalin  200 mg Per Tube QHS  . sodium chloride  10-40 mL Intracatheter Q12H    Continuous Infusions: . sodium chloride 50 mL/hr at 09/20/14 0448  . dextrose 40 mL/hr at 09/20/14 0448  . feeding supplement (VITAL AF 1.2 CAL) Stopped (09/19/14 0000)    Molli Barrows, RD, LDN, [place] Pager [phone] After Hours Pager [phone]

## 2014-09-21 ENCOUNTER — Inpatient Hospital Stay (HOSPITAL_COMMUNITY): Payer: Medicaid Other

## 2014-09-21 DIAGNOSIS — IMO0001 Reserved for inherently not codable concepts without codable children: Secondary | ICD-10-CM | POA: Insufficient documentation

## 2014-09-21 LAB — CBC
HCT: 32.4 % — ABNORMAL LOW (ref 39.0–52.0)
HEMOGLOBIN: 10.6 g/dL — AB (ref 13.0–17.0)
MCH: 26.4 pg (ref 26.0–34.0)
MCHC: 32.7 g/dL (ref 30.0–36.0)
MCV: 80.6 fL (ref 78.0–100.0)
Platelets: 473 10*3/uL — ABNORMAL HIGH (ref 150–400)
RBC: 4.02 MIL/uL — AB (ref 4.22–5.81)
RDW: 14.9 % (ref 11.5–15.5)
WBC: 18.2 10*3/uL — ABNORMAL HIGH (ref 4.0–10.5)

## 2014-09-21 LAB — GLUCOSE, CAPILLARY
GLUCOSE-CAPILLARY: 201 mg/dL — AB (ref 70–99)
GLUCOSE-CAPILLARY: 324 mg/dL — AB (ref 70–99)
Glucose-Capillary: 197 mg/dL — ABNORMAL HIGH (ref 70–99)
Glucose-Capillary: 202 mg/dL — ABNORMAL HIGH (ref 70–99)
Glucose-Capillary: 232 mg/dL — ABNORMAL HIGH (ref 70–99)
Glucose-Capillary: 87 mg/dL (ref 70–99)

## 2014-09-21 LAB — BASIC METABOLIC PANEL
Anion gap: 9 (ref 5–15)
BUN: 9 mg/dL (ref 6–23)
CHLORIDE: 102 meq/L (ref 96–112)
CO2: 25 mEq/L (ref 19–32)
Calcium: 9.8 mg/dL (ref 8.4–10.5)
Creatinine, Ser: 0.55 mg/dL (ref 0.50–1.35)
GFR calc non Af Amer: >90 mL/min (ref >90)
Glucose, Bld: 151 mg/dL — ABNORMAL HIGH (ref 70–99)
POTASSIUM: 3.4 meq/L — AB (ref 3.7–5.3)
Sodium: 136 mEq/L — ABNORMAL LOW (ref 137–147)

## 2014-09-21 LAB — PHOSPHORUS: Phosphorus: 3.2 mg/dL (ref 2.3–4.6)

## 2014-09-21 LAB — MAGNESIUM: Magnesium: 1.5 mg/dL (ref 1.5–2.5)

## 2014-09-21 MED ORDER — INSULIN GLARGINE 100 UNIT/ML ~~LOC~~ SOLN: 5.0000 [IU] | Freq: Every day | SUBCUTANEOUS | Status: DC

## 2014-09-21 MED ORDER — POTASSIUM CHLORIDE 10 MEQ/100ML IV SOLN
10.0000 meq | INTRAVENOUS | Status: AC
  Administered 2014-09-21 (×3): 10 meq via INTRAVENOUS
  Filled 2014-09-21 (×3): qty 100

## 2014-09-21 MED ORDER — MAGNESIUM SULFATE 50 % IJ SOLN
4.0000 g | Freq: Once | INTRAVENOUS | Status: AC
  Administered 2014-09-21: 4 g via INTRAVENOUS
  Filled 2014-09-21: qty 8

## 2014-09-21 MED ORDER — INSULIN ASPART 100 UNIT/ML ~~LOC~~ SOLN
0.0000 [IU] | Freq: Three times a day (TID) | SUBCUTANEOUS | Status: DC
  Administered 2014-09-21: 11 [IU] via SUBCUTANEOUS
  Administered 2014-09-22: 15 [IU] via SUBCUTANEOUS
  Administered 2014-09-22 – 2014-09-23 (×3): 8 [IU] via SUBCUTANEOUS
  Administered 2014-09-23: 11 [IU] via SUBCUTANEOUS

## 2014-09-21 MED ORDER — INSULIN GLARGINE 100 UNIT/ML ~~LOC~~ SOLN
15.0000 [IU] | Freq: Every day | SUBCUTANEOUS | Status: DC
Start: 2014-09-21 — End: 2014-09-21
  Filled 2014-09-21: qty 0.15

## 2014-09-21 NOTE — Progress Notes (Signed)
Inpatient Diabetes Program Recommendations  AACE/ADA: New Consensus Statement on Inpatient Glycemic Control (2013)  Target Ranges:  Prepandial:   less than 140 mg/dL      Peak postprandial:   less than 180 mg/dL (1-2 hours)      Critically ill patients:  140 - 180 mg/dL   Diabetes history: Type 1 diabetes Outpatient Diabetes medications: Lantus 26 units daily, Novolog 5 units tid with meals Current orders for Inpatient glycemic control:  Lantus 10 units daily, Novolog (ICU Glycemic Control Protocol) 2-4-6 q 4 hours  Please consider stopping Novolog ICU glycemic control protocol and beginning Novolog correction sensitive tid with meals and HS scale. Also consider adding Novolog meal coverage 3 units tid with meals (to be held if patient eats less than 50%).   Note that CBG post prandial remains elevated despite current dosing.   Gentry Fitz, RN, BA, MHA, CDE Diabetes Coordinator Inpatient Diabetes Program  [phone] (Team Pager) [phone] [facility]) 09/21/2014 10:29 AM

## 2014-09-21 NOTE — Progress Notes (Signed)
PULMONARY / CRITICAL CARE MEDICINE   Name: Jason [name] MRN: [id number] DOB: [birth year]    ADMISSION DATE:  09/16/2014 CONSULTATION DATE:  121/0/2015  REFERRING MD :  EDP Oni  CHIEF COMPLAINT:  AMS  INITIAL PRESENTATION: 28 year old with complex medical history, paraplegic, recent admission for MSSA bacteremia, sent home on IV abx with PICC on 11/19.  Returned to [facility] ED 12/10 with altered mental status and fevers. He was unresponsive in ED requiring intubation. He was noted to have profound leukocytosis, started on ABX and PCCM was asked to see for admission.   STUDIES:  2D echo 12/12>>>  SIGNIFICANT EVENTS: 12/13 extubated  SUBJECTIVE: To sdu, no distress  VITAL SIGNS: Temp:  [98.2 F (36.8 C)-99.2 F (37.3 C)] 98.2 F (36.8 C) (12/15 1212) Pulse Rate:  [62-148] 73 (12/15 1212) Resp:  [15-25] 16 (12/15 1212) BP: (93-154)/(38-101) 122/81 mmHg (12/15 1212) SpO2:  [93 %-100 %] 98 % (12/15 1212) Weight:  [57 kg (125 lb 10.6 oz)-64.9 kg (143 lb 1.3 oz)] 57 kg (125 lb 10.6 oz) (12/15 1212) HEMODYNAMICS:   VENTILATOR SETTINGS:   INTAKE / OUTPUT:  Intake/Output Summary (Last 24 hours) at 09/21/14 1545 Last data filed at 09/21/14 1100  Gross per 24 hour  Intake   2630 ml  Output   4770 ml  Net  -2140 ml    PHYSICAL EXAMINATION: General:no distress Neuro:  Para, aake HEENT:  [place]/AT, PERRL Cardiovascular:  s1s2, regular, no MRG Lungs:  ronchi bases, reduced more left Abdomen:  Soft, non-distended. BS hypoactive x 4.  Musculoskeletal:  No acute deformity.  Skin:  No changes sacrum  LABS:  CBC  Recent Labs Lab 09/19/14 0421 09/20/14 0500 09/21/14 0635  WBC 17.4* 18.2* 18.2*  HGB 6.8* 11.5* 10.6*  HCT 21.6* 34.3* 32.4*  PLT 556* 586* 473*   Coag's No results for input(s): APTT, INR in the last 168 hours. BMET  Recent Labs Lab 09/19/14 0421 09/20/14 0500 09/21/14 0635  NA 143 135* 136*  K 3.4* 3.9 3.4*  CL 113* 104 102  CO2 21 22 25   BUN 10 7 9    CREATININE 0.59 0.56 0.55  GLUCOSE 203* 93 151*   Electrolytes  Recent Labs Lab 09/17/14 0427 09/17/14 2000  09/19/14 0421 09/20/14 0500 09/21/14 0635  CALCIUM 9.7  --   < > 9.4 10.3 9.8  MG 1.4* 2.3  --  1.5  --  1.5  PHOS 2.8  --   --  2.6  --  3.2  < > = values in this interval not displayed. Sepsis Markers  Recent Labs Lab 09/16/14 0335 09/16/14 0342 09/16/14 0626 09/17/14 0428  LATICACIDVEN  --  1.07 1.66 0.9  PROCALCITON 5.63  --   --   --    ABG  Recent Labs Lab 09/16/14 0424  PHART 7.351  PCO2ART 40.2  PO2ART 440.0*   Liver Enzymes  Recent Labs Lab 09/16/14 0335 09/17/14 0427  AST 223* 84*  ALT 113* 93*  ALKPHOS 120* 88  BILITOT <0.2* 0.2*  ALBUMIN 2.4* 2.0*   Cardiac Enzymes No results for input(s): TROPONINI, PROBNP in the last 168 hours. Glucose  Recent Labs Lab 09/20/14 1555 09/20/14 1937 09/20/14 2351 09/21/14 0400 09/21/14 0815 09/21/14 1308  GLUCAP 153* 191* 232* 197* 87 201*    Imaging Dg Chest Port 1 View  09/20/2014   CLINICAL DATA:  Respiratory failure and shortness of breath  EXAM: PORTABLE CHEST - 1 VIEW  COMPARISON:  09/19/2014  FINDINGS:  Interval tracheal and esophageal extubation. Right upper extremity PICC positioning is stable and unremarkable. Interval increased density of the lower lungs. The streaky opacity at the left base was present on previous studies. The opacity at the right base is new. Small left pleural effusion noted. No pneumothorax.  IMPRESSION: 1. Increased density of the lower lungs after extubation. This could represent progressive pneumonia or interval aspiration. 2. Small left pleural effusion.   Electronically Signed   By: Jorje Guild M.D.   On: 09/20/2014 05:53     ASSESSMENT / PLAN:  PULMONARY OETT 12/10 >>> A: Acute hypoxic respiratory failure, in part intubated due to altered MS ? HCAP less impressed Concern worsening atx left  P:   -ATx noted, repeat pcxr in am  -IS, flutter  valve -chest pt on going -suctioning as able, NTS -had neg balance, maintain -if distress ETT= trach needed  CARDIOVASCULAR CVL LUE PICC (pta) >>>12/12 RUE PICC 12/12>>> A:  Hypotension 2/2 severe sepsis, improved with IVF H/o HTN P:  Tele Lasix maintain  RENAL A:   AKI in setting sepsis, improved HypoK, mag Small hyperchloremic acidosis from saline Hypok, mag P:   Lasix maintain kvo Chem in am IV supp k ,mag  GASTROINTESTINAL A:   Transaminitis, likely shock liver, improved.  - 12/10 Abd [name] with ? Sludge, no other acute findings P:   ppi Feed, slp completed on diet  HEMATOLOGIC A:   Chronic anemia (negative workup on last admission) Now some dilution Transfuse per normal ICU guidelines - 2 units PRBC 12/13 P:  Cbc in am  SCDs  INFECTIOUS A:   Severe sepsis - r/t previous known MSSA bacteremia now +/- HCAP, UTI, osteomyelitis. MRI sacrum with probable osteomyelitis 12/10  P:   BCx2 12/10> UC 12/10> GNR>>>klebsiella Sputum 12/10>staph few, pathogen? unimpressed  Vancomycin (preadmission)>>>12/13 Meropenem 12/10 >>>12/14 cipro 12/14>>>stop date in place Diflucan12/10>>>12/12  Surgery following for sacral wound   Narrow imi to cipro, done to extend for wound  ENDOCRINE A:   DM with hyperglycemia    P:   CBG monitoring and SSI likely to need low dosel antus as eating  NEUROLOGIC A:   Acute metabolic encephalopathy in setting of sepsis H/o seizures, CVA  P:   Upright as able PT active as we can Pre admission preadmission xanax, baclofen,  Restart lyrica on 12/11  FAMILY  - Updates: no family available on 12/13  - Inter-disciplinary family meet or Palliative Care meeting due by:  12/17  Lavon Paganini. Titus Mould, MD, [place] Pgr: [place] [facility] [facility] 09/21/2014 3:45 PM

## 2014-09-21 NOTE — Progress Notes (Signed)
Rehab Admissions Coordinator Note:  Patient was screened by Retta Diones for appropriateness for an Inpatient Acute Rehab Consult.  At this time, we are recommending Inpatient Rehab consult.  Jodell Cipro M 09/21/2014, 11:46 AM  I can be reached at [phone].

## 2014-09-21 NOTE — Progress Notes (Signed)
Pt moved to air mattress bed per MD order.

## 2014-09-21 NOTE — Evaluation (Signed)
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 He was unresponsive in ED requiring intubation 12/10-12/13. He was noted to have profound leukocytosis,  Clinical Impression  Pt pleasant and willing to mobilize stating increasing weakness with decreased functional mobility over the last 2 weeks. Pt with all necessary equipment at home but decreased ability of mom to perform transfers. Pt will benefit from acute therapy to maximize mobility, function, strength, transfers and balance to decrease burden of care. Pt with Bil Prevalon end of session with education for need to monitor skin integrity of sacrum and feet as well as positioning. Pt encouraged to assist nursing with all mobility and continue bil UE motion for strengthening. Pt maintained sats 94% on RA throughout.     Follow Up Recommendations CIR;Supervision/Assistance - 24 hour    Equipment Recommendations  None recommended by PT    Recommendations for Other Services OT consult;Rehab consult     Precautions / Restrictions Precautions Precautions: Fall Precaution Comments: paraplegic, bil foot wounds, decubitus ulcers      Mobility  Bed Mobility Overal bed mobility: + 2 for safety/equipment;Needs Assistance Bed Mobility: Supine to Sit;Sit to Supine     Supine to sit: Max assist Sit to supine: Total assist;+2 for physical assistance   General bed mobility comments: cueing, assist and total assist to move lower body onto and off of bed with max assist to maneuver trunk. Pt with maintained flexed posture without trunk control and max assist for balance EOB. Total assist to scoot and position self at EOB and back in bed. Positioned with pillow under left hip end of  session  Transfers Overall transfer level:  (pt deferred lift OOB at this time and fatigued with 4 min sitting EOB)                  Ambulation/Gait                Stairs            Wheelchair Mobility    Modified Rankin (Stroke Patients Only)       Balance Overall balance assessment: Needs assistance   Sitting balance-Leahy Scale: Zero                                       Pertinent Vitals/Pain Pain Assessment: 0-10 Pain Score: 4  Pain Location: low back pain although pt states no sensation below T5 Pain Descriptors / Indicators: Aching Pain Intervention(s): Repositioned  BP supine 96/61 BP EOB 93/38 no dizziness but fatigued with sitting HR 74 sats 94% on RA , 4L at 100% on arrival and on 1L at 96% end of session    Home Living Family/patient expects to be discharged to:: Private residence Living Arrangements: Parent Available Help at Discharge: Family;Available 24 hours/day Type of Home: House Home Access: Level entry     Home Layout: One level Home Equipment: Bedside commode;Hospital bed;Wheelchair - power (air overlay, [employer] fast streamer, Veterinary surgeon)      Prior Function Level of Independence: Needs assistance   Gait / Transfers Assistance Needed: pt was performing sliding board transfers with min assist until 2 weeks ago and since then has  only been using [employer] fast streamer for [employer]  ADL's / Homemaking Assistance Needed: max assist for sponge bathing, toileting and ADLs        Hand Dominance   Dominant Hand: Left    Extremity/Trunk Assessment   Upper Extremity Assessment: RUE deficits/detail;LUE deficits/detail;Generalized weakness RUE Deficits / Details: not formally assessed with tenodesis grip and generalized bil UE weakness, Unable to support trunk in sitting with bil UE     LUE Deficits / Details: not formally assessed with tenodesis grip and generalized bil UE weakness, Unable to support trunk in sitting with bil  UE   Lower Extremity Assessment: RLE deficits/detail;LLE deficits/detail RLE Deficits / Details: no AROM bil LE PROM wFL limited dorsiflexion LLE Deficits / Details: no AROM bil LE PROM wFL limited dorsiflexion     Communication   Communication: No difficulties  Cognition Arousal/Alertness: Awake/alert Behavior During Therapy: WFL for tasks assessed/performed Overall Cognitive Status: Within Functional Limits for tasks assessed                      General Comments      Exercises        Assessment/Plan    PT Assessment Patient needs continued PT services  PT Diagnosis Other (comment);Generalized weakness (paraplegia)   PT Problem List Decreased strength;Decreased activity tolerance;Decreased balance;Decreased mobility;Impaired tone;Decreased skin integrity  PT Treatment Interventions Functional mobility training;Therapeutic activities;Therapeutic exercise;Balance training;Patient/family education   PT Goals (Current goals can be found in the Care Plan section) Acute Rehab PT Goals Patient Stated Goal: return home PT Goal Formulation: With patient Time For Goal Achievement: 10/05/14 Potential to Achieve Goals: Fair    Frequency Min 3X/week   Barriers to discharge Decreased caregiver support      Co-evaluation               End of Session   Activity Tolerance: Patient limited by fatigue Patient left: in bed;with call bell/phone within reach Nurse Communication: Mobility status;Need for lift equipment;Precautions         Time: 7510-2585 PT Time Calculation (min) (ACUTE ONLY): 21 min   Charges:   PT Evaluation $Initial PT Evaluation Tier I: 1 Procedure PT Treatments $Therapeutic Activity: 8-22 mins   PT G Codes:          Melford Aase 09/21/2014, 9:12 AM Elwyn Reach, [place]

## 2014-09-22 ENCOUNTER — Ambulatory Visit: Payer: Medicaid Other | Admitting: Infectious Disease

## 2014-09-22 LAB — CULTURE, BLOOD (ROUTINE X 2)
Culture: NO GROWTH
Culture: NO GROWTH

## 2014-09-22 LAB — GLUCOSE, CAPILLARY
GLUCOSE-CAPILLARY: 422 mg/dL — AB (ref 70–99)
GLUCOSE-CAPILLARY: 367 mg/dL — AB (ref 70–99)
GLUCOSE-CAPILLARY: 273 mg/dL — AB (ref 70–99)
GLUCOSE-CAPILLARY: 275 mg/dL — AB (ref 70–99)

## 2014-09-22 LAB — BASIC METABOLIC PANEL
Anion gap: 11 (ref 5–15)
BUN: 12 mg/dL (ref 6–23)
CO2: 24 mEq/L (ref 19–32)
CREATININE: 0.62 mg/dL (ref 0.50–1.35)
Calcium: 9.5 mg/dL (ref 8.4–10.5)
Chloride: 97 mEq/L (ref 96–112)
GFR calc non Af Amer: >90 mL/min (ref >90)
Glucose, Bld: 433 mg/dL — ABNORMAL HIGH (ref 70–99)
Potassium: 3.8 mEq/L (ref 3.7–5.3)
Sodium: 132 mEq/L — ABNORMAL LOW (ref 137–147)

## 2014-09-22 LAB — GLUCOSE, RANDOM: GLUCOSE: 437 mg/dL — AB (ref 70–99)

## 2014-09-22 LAB — CLOSTRIDIUM DIFFICILE BY PCR: Toxigenic C. Difficile by PCR: NEGATIVE

## 2014-09-22 MED ORDER — INSULIN GLARGINE 100 UNIT/ML ~~LOC~~ SOLN
15.0000 [IU] | Freq: Every day | SUBCUTANEOUS | Status: DC
  Administered 2014-09-22 – 2014-09-23 (×2): 15 [IU] via SUBCUTANEOUS
  Filled 2014-09-22 (×2): qty 0.15

## 2014-09-22 MED ORDER — INSULIN ASPART 100 UNIT/ML ~~LOC~~ SOLN
20.0000 [IU] | Freq: Once | SUBCUTANEOUS | Status: AC
  Administered 2014-09-22: 20 [IU] via SUBCUTANEOUS

## 2014-09-22 MED ORDER — PANTOPRAZOLE SODIUM 40 MG PO PACK
40.0000 mg | PACK | Freq: Every day | ORAL | Status: DC
  Administered 2014-09-22 – 2014-09-23 (×2): 40 mg
  Filled 2014-09-22 (×3): qty 20

## 2014-09-22 MED ORDER — CIPROFLOXACIN HCL 500 MG PO TABS
500.0000 mg | ORAL_TABLET | Freq: Two times a day (BID) | ORAL | Status: DC
  Administered 2014-09-22 – 2014-09-24 (×4): 500 mg via ORAL
  Filled 2014-09-22 (×6): qty 1

## 2014-09-22 NOTE — Progress Notes (Signed)
Pharmacy note: cipro  28 yo male on cipro IV for a sacral wound infection. WBC= 18.2, afebrile, MRI sacrum with probable osteomyelitis 12/10. Spoke to Dr. Lamonte Sakai about the length of therapy. For now, plans to continue cipro for a total antibiotic duration of 14 days. Pharmacy was asked to change cipro to po, He will also discuss with surgery and extend to antibioc regimen if needed.   Vanc PTA>>12/13 Flucon PTA>>12/14 Meropenem 12/10>>12/14 Cipro 12/14>>   12/10 VR = 27 12/11 VR = 15  12/10 Urine >100K Kleb, sens all except amp 12/10 Blood> 12/11 Trach Asp > MSSA (S to Levo, Vanc, bactrim)  Plan -Continue cipro as 500mg  po bid until 09/30/14 (14 days from start of meropenem) -The end date has been entered  Thank you, Hildred Laser, Pharm D 09/22/2014 2:32 PM

## 2014-09-22 NOTE — Progress Notes (Signed)
Blood glucose greater than 400. Stat lab blood glucose collected glucose 437. Dr. Alva Garnet paged per critical care pager. Dr. Alva Garnet states he is not the attending but gives orders for 20 units Novolog now and states to make sure attending is aware that patient was severely hyperglycemic.

## 2014-09-22 NOTE — Progress Notes (Addendum)
Patient was sleeping and previously requested not to do CPT.

## 2014-09-22 NOTE — Progress Notes (Signed)
Inpatient Diabetes Program Recommendations  AACE/ADA: New Consensus Statement on Inpatient Glycemic Control (2013)  Target Ranges:  Prepandial:   less than 140 mg/dL      Peak postprandial:   less than 180 mg/dL (1-2 hours)      Critically ill patients:  140 - 180 mg/dL   Reason for Visit: Patient with Type 1 diabetes/  CBG up to 433 mg/dL this morning.   Diabetes history: Type 1 diabetes Outpatient Diabetes medications: Novolog 5 units tid with meals, Lantus 26 units daily Current orders for Inpatient glycemic control:  Novolog moderate tid with meals  It appears that patient did not receive any basal insulin on 09/21/14.  Note that patient is fairly sensitive to insulin due to history of Type 1 diabetes.    Please consider restarting basal insulin such as Lantus 14 units daily (First dose today).  Also please decrease Novolog correction to sensitive and add Novolog meal coverage 3 units tid with meals.  Called and discussed with RN.  Also paged MD for further orders.    Will follow.  Thanks, Adah Perl, RN, BC-ADM Inpatient Diabetes Coordinator Pager [phone]

## 2014-09-22 NOTE — Progress Notes (Signed)
Cdif sample sent to the lab. 3 loose stool in a row since transfer

## 2014-09-22 NOTE — Progress Notes (Signed)
PULMONARY / CRITICAL CARE MEDICINE   Name: Jason [name] MRN: [id number] DOB: [birth year]    ADMISSION DATE:  09/16/2014 CONSULTATION DATE:  121/0/2015  REFERRING MD :  EDP Oni  CHIEF COMPLAINT:  AMS  INITIAL PRESENTATION: 28 year old with complex medical history, paraplegic, recent admission for MSSA bacteremia, sent home on IV abx with PICC on 11/19.  Returned to [facility] ED 12/10 with altered mental status and fevers. He was unresponsive in ED requiring intubation. He was noted to have profound leukocytosis, started on ABX and PCCM was asked to see for admission.   STUDIES:   SUBJECTIVE: To sdu, no distress  VITAL SIGNS: Temp:  [98.1 F (36.7 C)-98.8 F (37.1 C)] 98.1 F (36.7 C) (12/16 1303) Pulse Rate:  [83-89] 89 (12/16 0400) Resp:  [18-19] 19 (12/16 0400) BP: (121-150)/(76-86) 150/83 mmHg (12/16 0400) SpO2:  [98 %-99 %] 98 % (12/16 0400) Weight:  [56.2 kg (123 lb 14.4 oz)] 56.2 kg (123 lb 14.4 oz) (12/16 0500) HEMODYNAMICS:   VENTILATOR SETTINGS:   INTAKE / OUTPUT:  Intake/Output Summary (Last 24 hours) at 09/22/14 1351 Last data filed at 09/22/14 1304  Gross per 24 hour  Intake    980 ml  Output   6600 ml  Net  -5620 ml    PHYSICAL EXAMINATION: General:no distress Neuro:  Para, aake HEENT:  [place]/AT, PERRL Cardiovascular:  s1s2, regular, no MRG Lungs:  ronchi bases, reduced more left Abdomen:  Soft, non-distended. BS hypoactive x 4.  Musculoskeletal:  No acute deformity.  Skin:  No changes sacrum  LABS:  CBC  Recent Labs Lab 09/19/14 0421 09/20/14 0500 09/21/14 0635  WBC 17.4* 18.2* 18.2*  HGB 6.8* 11.5* 10.6*  HCT 21.6* 34.3* 32.4*  PLT 556* 586* 473*   Coag's No results for input(s): APTT, INR in the last 168 hours. BMET  Recent Labs Lab 09/20/14 0500 09/21/14 0635 09/22/14 0641 09/22/14 0854  NA 135* 136* 132*  --   K 3.9 3.4* 3.8  --   CL 104 102 97  --   CO2 22 25 24   --   BUN 7 9 12   --   CREATININE 0.56 0.55 0.62  --    GLUCOSE 93 151* 433* 437*   Electrolytes  Recent Labs Lab 09/17/14 0427 09/17/14 2000  09/19/14 0421 09/20/14 0500 09/21/14 0635 09/22/14 0641  CALCIUM 9.7  --   < > 9.4 10.3 9.8 9.5  MG 1.4* 2.3  --  1.5  --  1.5  --   PHOS 2.8  --   --  2.6  --  3.2  --   < > = values in this interval not displayed. Sepsis Markers  Recent Labs Lab 09/16/14 0335 09/16/14 0342 09/16/14 0626 09/17/14 0428  LATICACIDVEN  --  1.07 1.66 0.9  PROCALCITON 5.63  --   --   --    ABG  Recent Labs Lab 09/16/14 0424  PHART 7.351  PCO2ART 40.2  PO2ART 440.0*   Liver Enzymes  Recent Labs Lab 09/16/14 0335 09/17/14 0427  AST 223* 84*  ALT 113* 93*  ALKPHOS 120* 88  BILITOT <0.2* 0.2*  ALBUMIN 2.4* 2.0*   Cardiac Enzymes No results for input(s): TROPONINI, PROBNP in the last 168 hours. Glucose  Recent Labs Lab 09/21/14 0815 09/21/14 1151 09/21/14 1308 09/21/14 1628 09/22/14 0807 09/22/14 1301  GLUCAP 87 202* 201* 324* 422* 367*    Imaging Dg Chest Port 1 View  09/21/2014   CLINICAL DATA:  Shortness  of breath, pulmonary edema, history of asthma, diabetes, and cardiac dysrhythmia  EXAM: PORTABLE CHEST - 1 VIEW  COMPARISON:  Portable chest x-ray of September 20, 2014 at 4:54 a.m.  FINDINGS: The lungs are adequately inflated. There is persistent increased density at both lung bases worrisome for atelectasis or aspiration. The left hemidiaphragm is now obscured and the right hemidiaphragm is nearly obscured. The upper lobes are clear. There is pleural fluid on the left. The cardiac silhouette and pulmonary vascularity are normal. The right sided PICC line tip projects over the cavoatrial junction and is stable.  IMPRESSION: Slight interval worsening in the appearance of the lung bases consistent with progressive atelectasis or pneumonia. There is a small left pleural effusion.   Electronically Signed   By: David  Martinique   On: 09/21/2014 07:34     ASSESSMENT /  PLAN:  PULMONARY OETT 12/10 >>> 12/13 A: Acute hypoxic respiratory failure, in part intubated due to altered MS ? HCAP  Concern worsening atx left  P:   -IS, flutter valve -chest pt on going -suctioning as able, NTS -had neg balance, maintain -if distress ETT= trach needed  CARDIOVASCULAR CVL LUE PICC (pta) >>>12/12 RUE PICC 12/12>>> A:  Hypotension 2/2 severe sepsis, improved with IVF H/o HTN P:  Tele Continue lasix at current dose for another day  RENAL A:   AKI in setting sepsis, improved HypoK, mag Small hyperchloremic acidosis from saline Hypok, mag P:   Lasix, adjust dose 12/17 kvo Follow BMP  GASTROINTESTINAL A:   Transaminitis, likely shock liver, improved.  - 12/10 Abd [name] with ? Sludge, no other acute findings P:   ppi Feed, slp completed on diet  HEMATOLOGIC A:   Chronic anemia (negative workup on last admission) Now some dilution Transfuse per normal ICU guidelines - 2 units PRBC 12/13 P:  Cbc in am  SCDs  INFECTIOUS A:   Severe sepsis - r/t previous known MSSA bacteremia now +/- HCAP, UTI, osteomyelitis. MRI sacrum with probable osteomyelitis 12/10  P:   BCx2 12/10> UC 12/10> GNR>>>klebsiella Sputum 12/10>staph few, pathogen?   Vancomycin (preadmission)>>>12/13 Meropenem 12/10 >>>12/14 cipro 12/14>>>stop date in place Diflucan12/10>>>12/12  Surgery following for sacral wound  Extended coverage w Cipro  ENDOCRINE A:   DM with hyperglycemia    P:   CBG monitoring and SSI Add lantus 15u 12/16  NEUROLOGIC A:   Acute metabolic encephalopathy in setting of sepsis H/o seizures, CVA  P:   Upright as able PT active as we can Pre admission xanax, baclofen, lyrica  FAMILY  - Updates:   - Inter-disciplinary family meet or Palliative Care meeting due by:  12/17  Baltazar Apo, MD, PhD 09/22/2014, 1:57 PM [facility] [phone] or if no answer [phone]

## 2014-09-22 NOTE — Progress Notes (Signed)
Did not perform CPT per patient request while sleeping.

## 2014-09-23 DIAGNOSIS — G8250 Quadriplegia, unspecified: Secondary | ICD-10-CM

## 2014-09-23 DIAGNOSIS — N390 Urinary tract infection, site not specified: Secondary | ICD-10-CM

## 2014-09-23 LAB — PHOSPHORUS: Phosphorus: 2.6 mg/dL (ref 2.3–4.6)

## 2014-09-23 LAB — GLUCOSE, CAPILLARY
GLUCOSE-CAPILLARY: 301 mg/dL — AB (ref 70–99)
GLUCOSE-CAPILLARY: 393 mg/dL — AB (ref 70–99)
Glucose-Capillary: 276 mg/dL — ABNORMAL HIGH (ref 70–99)
Glucose-Capillary: 263 mg/dL — ABNORMAL HIGH (ref 70–99)

## 2014-09-23 LAB — MAGNESIUM: MAGNESIUM: 1.6 mg/dL (ref 1.5–2.5)

## 2014-09-23 LAB — BASIC METABOLIC PANEL
ANION GAP: 11 (ref 5–15)
BUN: 12 mg/dL (ref 6–23)
CO2: 25 meq/L (ref 19–32)
CREATININE: 0.65 mg/dL (ref 0.50–1.35)
Calcium: 8.8 mg/dL (ref 8.4–10.5)
Chloride: 96 mEq/L (ref 96–112)
GFR calc non Af Amer: >90 mL/min (ref >90)
Glucose, Bld: 265 mg/dL — ABNORMAL HIGH (ref 70–99)
Potassium: 3.5 mEq/L — ABNORMAL LOW (ref 3.7–5.3)
SODIUM: 132 meq/L — AB (ref 137–147)

## 2014-09-23 MED ORDER — MAGNESIUM SULFATE 2 GM/50ML IV SOLN
2.0000 g | Freq: Once | INTRAVENOUS | Status: AC
  Administered 2014-09-23: 2 g via INTRAVENOUS
  Filled 2014-09-23: qty 50

## 2014-09-23 MED ORDER — INSULIN ASPART 100 UNIT/ML ~~LOC~~ SOLN
0.0000 [IU] | Freq: Three times a day (TID) | SUBCUTANEOUS | Status: DC
  Administered 2014-09-23: 15 [IU] via SUBCUTANEOUS
  Administered 2014-09-24: 5 [IU] via SUBCUTANEOUS

## 2014-09-23 MED ORDER — OXYCODONE-ACETAMINOPHEN 5-325 MG PO TABS
1.0000 | ORAL_TABLET | ORAL | Status: DC | PRN
  Administered 2014-09-23 (×3): 1 via ORAL
  Filled 2014-09-23 (×3): qty 1

## 2014-09-23 MED ORDER — POTASSIUM CHLORIDE CRYS ER 20 MEQ PO TBCR
40.0000 meq | EXTENDED_RELEASE_TABLET | Freq: Once | ORAL | Status: AC
  Administered 2014-09-23: 40 meq via ORAL
  Filled 2014-09-23: qty 2

## 2014-09-23 MED ORDER — INSULIN GLARGINE 100 UNIT/ML ~~LOC~~ SOLN
20.0000 [IU] | Freq: Every day | SUBCUTANEOUS | Status: DC
  Administered 2014-09-24: 20 [IU] via SUBCUTANEOUS
  Filled 2014-09-23: qty 0.2

## 2014-09-23 NOTE — Progress Notes (Signed)
Per DR Lamonte Sakai, CPT BID now

## 2014-09-23 NOTE — Progress Notes (Signed)
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 ED 12/10 with altered mental status and fevers. He was unresponsive in ED requiring intubation 12/10-12/13. He was noted to have profound leukocytosis,    PT Comments    Patient received incontinent of bowels, assisted with hygiene and pericare.  Patient able to perform some upper body positioning and rolling with increased assist. Patient dressings soiled, removed, and new dressings applied (wet to dry with santyl), meplex to great toe. Patient BP assessed and some EOB activities performed prior to patient fatigue. Patient assisted OOB to chair via total assist. Recommend total assist or lift for return to bed, nsg aware. Educated patient on pressure relief, patient demonstrates performance of off loading and pressure relief activities. BP and VSS.    Follow Up Recommendations  CIR;Supervision/Assistance - 24 hour     Equipment Recommendations  None recommended by PT    Recommendations for Other Services OT consult;Rehab consult     Precautions / Restrictions Precautions Precautions: Fall Precaution Comments: paraplegic, bil foot wounds, decubitus ulcers Restrictions Weight Bearing Restrictions: No    Mobility  Bed Mobility Overal bed mobility: + 2 for safety/equipment;Needs Assistance Bed Mobility: Rolling;Supine to Sit;Sit to Supine Rolling: Max assist   Supine to sit: Max assist     General bed mobility comments: patient able to use UE to assist with trunk mobility, but requires full assist for LE movement and elevation to upright  Transfers Overall transfer level: Needs assistance   Transfers: Squat Pivot Transfers     Squat pivot transfers: Total assist     General transfer comment: total assist face  to face with gait belt and chuck pad, patient able to use UE to grasp onto therapist arm during transfer   Ambulation/Gait                 Stairs            Wheelchair Mobility    Modified Rankin (Stroke Patients Only)       Balance Overall balance assessment: Needs assistance Sitting-balance support: Bilateral upper extremity supported Sitting balance-Leahy Scale: Zero Sitting balance - Comments: max assist for sitting EOB secondary to UE fatigue, poor trunk control Postural control: Posterior lean                          Cognition Arousal/Alertness: Awake/alert Behavior During Therapy: WFL for tasks assessed/performed Overall Cognitive Status: Within Functional Limits for tasks assessed                      Exercises  Reaching activities attempted EOB poor trunk control at this time, assist required     General Comments General comments (skin integrity, edema, etc.): BP monitored and assessed various times throughout session, VSS, dressings saturated in stool, changed with wet to dry and santyl application.       Pertinent Vitals/Pain Pain Assessment: 0-10 Pain Score: 2  Pain Location: back region Pain Descriptors / Indicators: Constant Pain Intervention(s): Repositioned;Monitored during session    Home Living                      Prior Function            PT Goals (current goals can  now be found in the care plan section) Acute Rehab PT Goals Patient Stated Goal: return home PT Goal Formulation: With patient Time For Goal Achievement: 10/05/14 Potential to Achieve Goals: Fair Progress towards PT goals: Progressing toward goals    Frequency  Min 3X/week    PT Plan Current plan remains appropriate    Co-evaluation             End of Session Equipment Utilized During Treatment: Gait belt;Oxygen Activity Tolerance: Patient limited by fatigue Patient left: in chair;with call bell/phone within reach      Time: 1123-1217 PT Time Calculation (min) (ACUTE ONLY): 54 min  Charges:  $Therapeutic Activity: 8-22 mins $Self Care/Home Management: 23-37                    G CodesDuncan Dull 2014/10/23, 1:24 PM Alben Deeds, [place] DPT  [phone]

## 2014-09-23 NOTE — Progress Notes (Signed)
Patient is currently sleeping and requested not to be woke up to do CPT.

## 2014-09-23 NOTE — Progress Notes (Signed)
Patient is still asleep. RT did not wake up for CPT per patient request.

## 2014-09-23 NOTE — Progress Notes (Signed)
Placed patient on continuous pulse ox for overnight recording. Patients sats are currently 97 HR 80.

## 2014-09-23 NOTE — Consult Note (Signed)
Physical Medicine and Rehabilitation Consult  Reason for Consult: Deconditioning Referring Physician:  Dr. Alva Garnet.    HPI: Jason [name] is a 28 y.o. male with history of DM type 1 with diabetic neuropathy and chronic pain, multiple admission for DKA,  spinal cord infarct with tetraplegia and neurogenic bladder (CIR stay 02/2014), MRSA sepsis due to sacral decub being treated by IV vancomycin. He was admitted on 09/16/14 with unresponsiveness. Family reported poor po intake with decline in activity level, fevers with fever and progressive lethargy. He was intubated for airway protection and started on broad spectrum antibiotics due to profound leucocytosis and fever of 103. He was treated with IVF for AKI in setting of sepsis. UCS positive for Klebsiella and CXR with RLL PNA.   MRI of pelvis done showing decubitus ulcer with complex multiloculated fluid collection consistent with abscess and osseous changes concerning for osteomyelitis v/s reactive changes. MRI left foot without evidence of osteomyelitis. CCS consulted for input on I and D and recommended local wound care. Patient tolerated extubation on 12/13 and respiratory status has been stable. To continue cipro for 14 day antibiotic regimen. Therapy evaluation done 12/15 and CIR recommended for follow up therapy.   Patient well known to CIR team. He is wants to go home tomorrow and come back for rehab after christmas. Discussed insurance restrictions as well as need for therapy at current time. He wants to think about this and discuss with his mother prior to committing to rehab stay.   Seen by Dr Naaman Plummer in PM&R clinic 12/4 meds changed MSO4 to Oxycodone.   Has Been Using [name] lift at home for 2 wks PTA on the advice of HHRN since sliding board transfers have been interfering with sacral decubitus healing   Review of Systems  HENT: Negative for hearing loss.   Eyes: Positive for blurred vision.  Respiratory: Negative for cough and  shortness of breath.   Cardiovascular: Negative for chest pain and palpitations.  Gastrointestinal: Negative for heartburn, nausea and abdominal pain.  Musculoskeletal: Positive for myalgias.  Neurological: Positive for tingling, sensory change (sensory loss BUE. ) and focal weakness. Negative for headaches.      Past Medical History  Diagnosis Date  . GERD (gastroesophageal reflux disease)   . Asthma   . Hx MRSA infection     on face  . Gastroparesis   . Diabetic neuropathy   . Seizures   . Stroke     spinal stroke in 4/15  . Diabetes mellitus     sees Dr. Loanne Drilling   . Family history of anesthesia complication     Pt mother can't have epidural procedures  . Dysrhythmia   . Pneumonia   . Arthritis   . Fibromyalgia     Past Surgical History  Procedure Laterality Date  . Tonsillectomy    . Multiple extractions with alveoloplasty N/A 08/03/2014    Procedure: MULTIPLE EXTRACTIONS;  Surgeon: Gae Bon, DDS;  Location: [facility];  Service: Oral Surgery;  Laterality: N/A;  . Tee without cardioversion N/A 08/17/2014    Procedure: TRANSESOPHAGEAL ECHOCARDIOGRAM (TEE);  Surgeon: Dorothy Spark, MD;  Location: [facility];  Service: Cardiovascular;  Laterality: N/A;    Family History  Problem Relation Age of Onset  . Diabetes Father   . Hypertension Father   . Asthma      fhx  . Hypertension      fhx  . Stroke      fhx  .  Heart disease Mother     Social History:  Lives with parents. Was independent for SB transfer prior to 08/2014 admission. Per reports that he has been smoking Cigars and Cigarettes.  He has been smoking about 0.00 packs per day. He has never used smokeless tobacco. He reports that he does not drink alcohol or use illicit drugs.    Allergies  Allergen Reactions  . Cefuroxime Axetil Anaphylaxis  . Penicillins Anaphylaxis and Other (See Comments)    ?can take amoxicillin?  [name] [Benzonatate] Anaphylaxis  . Shellfish Allergy Itching and Other  (See Comments)    Took benadryl to alleviate reaction    Medications Prior to Admission  Medication Sig Dispense Refill  . albuterol (PROVENTIL) (2.5 MG/3ML) 0.083% nebulizer solution Take 3 mLs (2.5 mg total) by nebulization every 4 (four) hours as needed for wheezing or shortness of breath. 75 mL 3  . ALPRAZolam (XANAX) 1 MG tablet Take 1 tablet (1 mg total) by mouth every 6 (six) hours as needed for anxiety. 120 tablet 2  . Amino Acids-Protein Hydrolys (FEEDING SUPPLEMENT, PRO-STAT SUGAR FREE 64,) LIQD Take 30 mLs by mouth daily. 900 mL 0  . amLODipine (NORVASC) 2.5 MG tablet Take 1 tablet (2.5 mg total) by mouth daily. 30 tablet 0  . atorvastatin (LIPITOR) 20 MG tablet Take 20 mg by mouth daily.  0  . baclofen (LIORESAL) 10 MG tablet Take 10-20 mg by mouth 3 (three) times daily. Takes 10mg  in morning and lunchtime and takes 20mg  at bedtime    . collagenase (SANTYL) ointment Apply 1 application topically daily. 90 g 5  . dexlansoprazole (DEXILANT) 60 MG capsule Take 60 mg by mouth daily.    . diclofenac sodium (VOLTAREN) 1 % GEL Apply 2 g topically daily as needed (for pain).    Marland Kitchen diphenoxylate-atropine (LOMOTIL) 2.5-0.025 MG per tablet Take 1 tablet by mouth 2 (two) times daily. (Patient taking differently: Take 1 tablet by mouth 2 (two) times daily as needed for diarrhea or loose stools. ) 60 tablet 0  . feeding supplement, GLUCERNA SHAKE, (GLUCERNA SHAKE) LIQD Take 237 mLs by mouth 3 (three) times daily between meals. 237 mL 90  . fluconazole (DIFLUCAN) 200 MG tablet Take 1 tablet (200 mg total) by mouth daily. 30 tablet 5  . furosemide (LASIX) 40 MG tablet Take 0.5 tablets (20 mg total) by mouth daily as needed for fluid. 30 tablet 0  . glucagon (GLUCAGON EMERGENCY) 1 MG injection Inject 1 mg into the vein once as needed. 1 each 12  . insulin aspart (NOVOLOG) 100 UNIT/ML injection Inject 5 Units into the skin 3 (three) times daily with meals. 10 mL 2  . insulin glargine (LANTUS) 100  UNIT/ML injection Inject 0.26 mLs (26 Units total) into the skin daily. (Patient taking differently: Inject 22 Units into the skin daily. ) 10 mL 0  . lidocaine (XYLOCAINE) 2 % jelly Apply 1 application topically 4 (four) times daily as needed (pain).   11  . loratadine (CLARITIN) 10 MG tablet Take 10 mg by mouth daily.     . metaxalone (SKELAXIN) 800 MG tablet Take 800 mg by mouth 3 (three) times daily as needed for muscle spasms.    . methocarbamol (ROBAXIN) 750 MG tablet Take 750 mg by mouth 4 (four) times daily as needed for muscle spasms.   1  . metoCLOPramide (REGLAN) 5 MG tablet Take 1 tablet (5 mg total) by mouth 2 (two) times a week. And as needed for nausea  8 tablet 3  . ondansetron (ZOFRAN) 8 MG tablet Take 8 mg by mouth 2 (two) times daily as needed for nausea or vomiting.   5  . oxyCODONE (ROXICODONE) 15 MG immediate release tablet Take 1 tablet (15 mg total) by mouth every 6 (six) hours as needed for pain. 120 tablet 0  . pregabalin (LYRICA) 100 MG capsule Take 100-200 mg by mouth 4 (four) times daily. Take 100 mg by mouth in the morning, take 100 mg by mouth in the afternoon, take 100 mg by mouth in the evening and take 200 mg by mouth at bedtime.    . silver sulfADIAZINE (SILVADENE) 1 % cream Apply 1 application topically 2 (two) times a week. Tuesday and thursday    . vancomycin 1,000 mg in sodium chloride 0.9 % 250 mL Inject 1,000 mg into the vein daily.     . vancomycin 750 mg in sodium chloride 0.9 % 150 mL Inject 750 mg into the vein once.    . bisacodyl (DULCOLAX) 5 MG EC tablet Take 1 tablet (5 mg total) by mouth 2 (two) times daily as needed for moderate constipation. 60 tablet 5  . glucose blood (ACCU-CHEK AVIVA PLUS) test strip Use to check blood sugar 4 times per day 400 each 2  . polyethylene glycol powder (GLYCOLAX/MIRALAX) powder Take 17 g by mouth daily. 850 g 5  . vancomycin 1,250 mg in sodium chloride 0.9 % 250 mL Inject 1,250 mg into the vein daily. Last dose to be  given on 09/24/14 (Patient not taking: Reported on 09/16/2014) 32 vial 0    Home: [place] expects to be discharged to:: Private residence Living Arrangements: Parent Available Help at Discharge: Family, Available 24 hours/day Type of Home: House Home Access: Level entry Home Layout: One level Home Equipment: Bedside commode, Hospital bed, Wheelchair - power (air overlay, [employer] fast streamer, sliding board)  Functional History: Prior Function Level of Independence: Needs assistance Gait / Transfers Assistance Needed: pt was performing sliding board transfers with min assist until 2 weeks ago and since then has only been using [employer] fast streamer for OOB ADL's / Homemaking Assistance Needed: max assist for sponge bathing, toileting and ADLs Functional Status:  Mobility: Bed Mobility Overal bed mobility: + 2 for safety/equipment, Needs Assistance Bed Mobility: Rolling, Supine to Sit, Sit to Supine Rolling: Max assist Supine to sit: Max assist Sit to supine: Total assist, +2 for physical assistance General bed mobility comments: patient able to use UE to assist with trunk mobility, but requires full assist for LE movement and elevation to upright Transfers Overall transfer level: Needs assistance Transfers: Squat Pivot Transfers Squat pivot transfers: Total assist General transfer comment: total assist face to face with gait belt and chuck pad, patient able to use UE to grasp onto therapist arm during transfer       ADL:    Cognition: Cognition Overall Cognitive Status: Within Functional Limits for tasks assessed Orientation Level: Oriented X4 Cognition Arousal/Alertness: Awake/alert Behavior During Therapy: WFL for tasks assessed/performed Overall Cognitive Status: Within Functional Limits for tasks assessed  Blood pressure 122/77, pulse 81, temperature 98 F (36.7 C), temperature source Oral, resp. rate 20, height 5\' 8"  (1.727 m), weight 59.467 kg (131 lb 1.6 oz), SpO2  100 %. Physical Exam  Nursing note and vitals reviewed. Constitutional: He is oriented to person, place, and time.  Thin, ill appearing male.   HENT:  Head: Normocephalic.  Eyes: Pupils are equal, round, and reactive to light. Right conjunctiva  is injected. Left conjunctiva is injected.  Neck: Normal range of motion. Neck supple.  Cardiovascular: Normal rate and regular rhythm.   No murmur heard. Respiratory: Effort normal and breath sounds normal. No respiratory distress.  GI: Soft. Bowel sounds are normal. He exhibits no distension. There is no tenderness.  Musculoskeletal:  BUE with evidence of muscle wasting. Prefore on bilateral feet with multiple foam dressing on denuded areas left foot. Right elbow with large abraded area. Left 2nd and 3 rd fingers with breakdown on PIP joint.   Neurological: He is alert and oriented to person, place, and time.  Bilateral foot drop with dense paraparesis. BUE with distal> proximal weakness. Numbness BLE and bilateral hands.   Skin: Skin is warm and dry.  Motor strength is 4 minus bilateral deltoid biceps 3 at the triceps 0 at the finger flexors and extensors 3- wrist flexors and extensors Lower extremities are 0/5 bilateral hip flexor, knee extensor and ankle dorsiflex plantarflex her Sensation is reduced in the hands, absent in the right lower extremity, partially preserved to light touch in the left lower extremity  Results for orders placed or performed during the hospital encounter of 09/16/14 (from the past 24 hour(s))  Glucose, capillary     Status: Abnormal   Collection Time: 09/22/14  4:39 PM  Result Value Ref Range   Glucose-Capillary 273 (H) 70 - 99 mg/dL  Glucose, capillary     Status: Abnormal   Collection Time: 09/22/14 10:12 PM  Result Value Ref Range   Glucose-Capillary 275 (H) 70 - 99 mg/dL  Basic metabolic panel     Status: Abnormal   Collection Time: 09/23/14  5:00 AM  Result Value Ref Range   Sodium 132 (L) 137 - 147 mEq/L     Potassium 3.5 (L) 3.7 - 5.3 mEq/L   Chloride 96 96 - 112 mEq/L   CO2 25 19 - 32 mEq/L   Glucose, Bld 265 (H) 70 - 99 mg/dL   BUN 12 6 - 23 mg/dL   Creatinine, Ser 0.65 0.50 - 1.35 mg/dL   Calcium 8.8 8.4 - 10.5 mg/dL   GFR calc non Af Amer >90 >90 mL/min   GFR calc Af Amer >90 >90 mL/min   Anion gap 11 5 - 15  Magnesium     Status: None   Collection Time: 09/23/14  5:00 AM  Result Value Ref Range   Magnesium 1.6 1.5 - 2.5 mg/dL  Phosphorus     Status: None   Collection Time: 09/23/14  5:00 AM  Result Value Ref Range   Phosphorus 2.6 2.3 - 4.6 mg/dL  Glucose, capillary     Status: Abnormal   Collection Time: 09/23/14  8:21 AM  Result Value Ref Range   Glucose-Capillary 276 (H) 70 - 99 mg/dL  Glucose, capillary     Status: Abnormal   Collection Time: 09/23/14  1:22 PM  Result Value Ref Range   Glucose-Capillary 263 (H) 70 - 99 mg/dL   No results found.  Assessment/Plan: Diagnosis: Spinal cord infarct causing quadriparesis,C7 motor complete, sensory incomplete 1. Does the need for close, 24 hr/day medical supervision in concert with the patient's rehab needs make it unreasonable for this patient to be served in a less intensive setting? No 2. Co-Morbidities requiring supervision/potential complications: Uncontrolled diabetes, sacral pressure sore 3. Due to bladder management, bowel management, safety, skin/wound care, disease management, medication administration, pain management and patient education, does the patient require 24 hr/day rehab nursing? Potentially 4. Does the patient  require coordinated care of a physician, rehab nurse, PT,OT to address physical and functional deficits in the context of the above medical diagnosis(es)? No Addressing deficits in the following areas: transferring 5. Can the patient actively participate in an intensive therapy program of at least 3 hrs of therapy per day at least 5 days per week? Potentially 6. The potential for patient to make  measurable gains while on inpatient rehab is NA 7. Anticipated functional outcomes upon discharge from inpatient rehab are n/a  with PT, n/a with OT, n/a with SLP. 8. Estimated rehab length of stay to reach the above functional goals is: NA 9. Does the patient have adequate social supports and living environment to accommodate these discharge functional goals? Potentially 10. Anticipated D/C setting: Home 11. Anticipated post D/C treatments: [place] therapy 12. Overall Rehab/Functional Prognosis: fair  RECOMMENDATIONS: This patient's condition is appropriate for continued rehabilitative care in the following setting: [facility] Therapy Patient has agreed to participate in recommended program. Yes Note that insurance prior authorization may be required for reimbursement for recommended care.  Comment: Patient will need Hoyer lift transfers until sacral wound improves.  Do not think any transfer training needed at this time. Has Hoyer lift at home mother has been trained. Recommend follow-up with wound center. Will follow-up with Dr. Naaman Plummer physical medicine and rehabilitation clinic.    09/23/2014

## 2014-09-23 NOTE — Progress Notes (Signed)
PULMONARY / CRITICAL CARE MEDICINE   Name: Jason [name] MRN: [id number] DOB: May 01, 1986    ADMISSION DATE:  09/16/2014 CONSULTATION DATE:  121/0/2015  REFERRING MD :  EDP Oni  CHIEF COMPLAINT:  AMS  INITIAL PRESENTATION: 28 year old with complex medical history, paraplegic, recent admission for MSSA bacteremia, sent home on IV abx with PICC on 11/19.  Returned to [facility] ED 12/10 with altered mental status and fevers. He was unresponsive in ED requiring intubation. He was noted to have profound leukocytosis, started on ABX and PCCM was asked to see for admission.   STUDIES:   SUBJECTIVE:  Pt denies acute complaints, hopeful to go home soon.  RN Case mgr querying if he would be candidate for CIR to rehab upper body strength  VITAL SIGNS: Temp:  [98.1 F (36.7 C)-99.4 F (37.4 C)] 99 F (37.2 C) (12/17 0800) Pulse Rate:  [81-93] 81 (12/17 0800) Resp:  [14-29] 14 (12/17 0800) BP: (108-144)/(73-86) 118/77 mmHg (12/17 0800) SpO2:  [95 %-100 %] 100 % (12/17 0800)  INTAKE / OUTPUT:  Intake/Output Summary (Last 24 hours) at 09/23/14 1216 Last data filed at 09/23/14 0900  Gross per 24 hour  Intake   1190 ml  Output   6100 ml  Net  -4910 ml    PHYSICAL EXAMINATION: General: no distress Neuro:  Para, AAOx4 HEENT:  [place]/AT, PERRL Cardiovascular:  s1s2, regular, no MRG Lungs:  resp's even/non-labored, lungs bilaterally coarse Abdomen:  Soft, non-distended. BS hypoactive x 4.  Musculoskeletal:  No acute deformity.  Skin:  No changes sacrum  LABS:  CBC  Recent Labs Lab 09/19/14 0421 09/20/14 0500 09/21/14 0635  WBC 17.4* 18.2* 18.2*  HGB 6.8* 11.5* 10.6*  HCT 21.6* 34.3* 32.4*  PLT 556* 586* 473*   BMET  Recent Labs Lab 09/21/14 0635 09/22/14 0641 09/22/14 0854 09/23/14 0500  NA 136* 132*  --  132*  K 3.4* 3.8  --  3.5*  CL 102 97  --  96  CO2 25 24  --  25  BUN 9 12  --  12  CREATININE 0.55 0.62  --  0.65  GLUCOSE 151* 433* 437* 265*    Electrolytes  Recent Labs Lab 09/19/14 0421  09/21/14 0635 09/22/14 0641 09/23/14 0500  CALCIUM 9.4  < > 9.8 9.5 8.8  MG 1.5  --  1.5  --  1.6  PHOS 2.6  --  3.2  --  2.6  < > = values in this interval not displayed. Sepsis Markers  Recent Labs Lab 09/17/14 0428  LATICACIDVEN 0.9   Glucose  Recent Labs Lab 09/21/14 1628 09/22/14 0807 09/22/14 1301 09/22/14 1639 09/22/14 2212 09/23/14 0821  GLUCAP 324* 422* 367* 273* 275* 276*    Imaging No results found.   ASSESSMENT / PLAN:  PULMONARY OETT 12/10 >>> 12/13 A: Acute hypoxic respiratory failure, in part intubated due to altered MS. ? HCAP  Concern worsening atx left P:   -IS, flutter valve -chest pt on going -suctioning as able, NTS -negative balance as BP/Sr Cr permit -if he were to require ETT= would consider early trach  CARDIOVASCULAR CVL LUE PICC (pta) >>>12/12 RUE PICC 12/12>>> A:  Hypotension 2/2 severe sepsis, improved with IVF H/o HTN P:  Tele Lasix to 20 mg QD (home dose is PRN)  RENAL A:   AKI in setting sepsis, improved HypoK Hypomagnesemia  Small hyperchloremic acidosis from saline P:   Adjust lasix as above kvo Follow BMP Replace K, Mag  GASTROINTESTINAL A:   Transaminitis, likely shock liver, improved. 12/10 Abd [name] with ? Sludge, no other acute findings P:   ppi Feed, slp completed on diet Carb modified diet   HEMATOLOGIC A:   Chronic anemia (negative workup on last admission) Now some dilution P:  Cbc in am  SCDs Transfuse per normal ICU guidelines - 2 units PRBC 12/13  INFECTIOUS A:   Severe sepsis - r/t previous known MSSA bacteremia now +/- HCAP, UTI, osteomyelitis. MRI sacrum with probable osteomyelitis 12/10 P:   BCx2 12/10 >> neg UC 12/10> GNR>>>klebsiella Sputum 12/10>staph few, pathogen?   Vancomycin (preadmission)>>>12/13 Meropenem 12/10 >>>12/14 cipro 12/14>>>stop date in place (12/23) Diflucan12/10>>>12/12  Surgery following for sacral  wound  Extended coverage w Cipro  ENDOCRINE A:   DM with hyperglycemia P:   CBG monitoring and SSI Increase lantus to 20 u 12/17  NEUROLOGIC A:   Acute metabolic encephalopathy in setting of sepsis H/o seizures, CVA P:   Upright as able PT active as we can Pre admission xanax, baclofen, lyrica  FAMILY  - Updates: patient updated at bedside.  CIR to review for possible rehab efforts for upper extremities.    - Inter-disciplinary family meet or Palliative Care meeting due by:  12/17   Noe Gens, NP-C [place] Pgr: [phone] or [phone]  Attending Note:  I have examined patient, reviewed labs, studies and notes. I have discussed the case with B Ollis and I agree with the data and plans as amended above. Expect he will be discharged on 12/18, either to home or to CIR.   Baltazar Apo, MD, PhD 09/23/2014, 12:31 PM [facility] [phone] or if no answer [phone]

## 2014-09-24 ENCOUNTER — Inpatient Hospital Stay (HOSPITAL_COMMUNITY): Payer: Medicaid Other

## 2014-09-24 LAB — BASIC METABOLIC PANEL
Anion gap: 8 (ref 5–15)
BUN: 15 mg/dL (ref 6–23)
CHLORIDE: 96 meq/L (ref 96–112)
CO2: 27 mEq/L (ref 19–32)
Calcium: 8.9 mg/dL (ref 8.4–10.5)
Creatinine, Ser: 0.72 mg/dL (ref 0.50–1.35)
GFR calc Af Amer: >90 mL/min (ref >90)
GFR calc non Af Amer: >90 mL/min (ref >90)
Glucose, Bld: 86 mg/dL (ref 70–99)
POTASSIUM: 4.2 meq/L (ref 3.7–5.3)
SODIUM: 131 meq/L — AB (ref 137–147)

## 2014-09-24 LAB — CBC
HCT: 30 % — ABNORMAL LOW (ref 39.0–52.0)
HEMOGLOBIN: 9.7 g/dL — AB (ref 13.0–17.0)
MCH: 26.2 pg (ref 26.0–34.0)
MCHC: 32.3 g/dL (ref 30.0–36.0)
MCV: 81.1 fL (ref 78.0–100.0)
Platelets: 376 10*3/uL (ref 150–400)
RBC: 3.7 MIL/uL — AB (ref 4.22–5.81)
RDW: 15.8 % — ABNORMAL HIGH (ref 11.5–15.5)
WBC: 20.2 10*3/uL — ABNORMAL HIGH (ref 4.0–10.5)

## 2014-09-24 LAB — GLUCOSE, CAPILLARY
GLUCOSE-CAPILLARY: 75 mg/dL (ref 70–99)
Glucose-Capillary: 210 mg/dL — ABNORMAL HIGH (ref 70–99)

## 2014-09-24 LAB — MAGNESIUM: Magnesium: 1.7 mg/dL (ref 1.5–2.5)

## 2014-09-24 MED ORDER — INSULIN GLARGINE 100 UNIT/ML ~~LOC~~ SOLN: 22.0000 [IU] | Freq: Every day | SUBCUTANEOUS | Status: DC

## 2014-09-24 MED ORDER — INSULIN GLARGINE 100 UNIT/ML ~~LOC~~ SOLN: 25.0000 [IU] | Freq: Every day | SUBCUTANEOUS | Status: DC

## 2014-09-24 MED ORDER — CIPROFLOXACIN HCL 500 MG PO TABS
500.0000 mg | ORAL_TABLET | Freq: Two times a day (BID) | ORAL | Status: AC
Start: 2014-09-24 — End: 2014-09-29

## 2014-09-24 MED ORDER — OXYCODONE HCL 15 MG PO TABS: 7.5000 mg | ORAL_TABLET | Freq: Four times a day (QID) | ORAL | Status: DC | PRN

## 2014-09-24 MED ORDER — MUPIROCIN CALCIUM 2 % EX CREA: TOPICAL_CREAM | Freq: Every day | CUTANEOUS | Status: DC

## 2014-09-24 NOTE — Progress Notes (Signed)
Inpatient Diabetes Program Recommendations  AACE/ADA: New Consensus Statement on Inpatient Glycemic Control (2013)  Target Ranges:  Prepandial:   less than 140 mg/dL      Peak postprandial:   less than 180 mg/dL (1-2 hours)      Critically ill patients:  140 - 180 mg/dL    Pt with high cbg's following meals-requiring high doses of correction which in turn appear to be causing eventual hypoglycemia - most fastings. Please consider using meal coverage at minimal dose of 3 units tidwc (pt getting supplemental nutrition in addition to carb mod diet) In order to prevent post-prandial hyperglycemia, please add the meal coverage and reduce correction to sensitive tidwc and HS scale (if glucose greater than 200 mg/dL at [facility])  Inpatient Diabetes Program Recommendations Insulin - Basal: Pt takes 27 units lantus at home. Noted increase in lantus to 20 units to start today.  Correction (SSI): Please decrease to sensitive tidwc and HS scale Insulin - Meal Coverage: Please add meal coverage of at least 3 units tidwc (takes 5 units tidwc at home)  Thank you, Rosita Kea, RN, CNS, Diabetes Coordinator [phone])

## 2014-09-24 NOTE — Progress Notes (Signed)
Patient discharge teaching performed. Patient acknowledged understanding verbally.  [name] present for education and signed discharge papers.  Patient is stable and able to discharge home at this time.

## 2014-09-24 NOTE — Progress Notes (Signed)
Pt most appropriate for [facility] therapy with hoyer lift transfers due to sliding board transfers not conducive to healing of sacral wound. Also to follow up with Dr. Naaman Plummer in his office as before. [phone]

## 2014-09-24 NOTE — Progress Notes (Signed)
NUTRITION FOLLOW UP  Intervention:   Continue Glucerna Shake PO TID, each supplement provides 220 kcal and 10 grams of protein  Nutrition Dx:   Increased nutrient needs related to multiple wounds as evidenced by estimated calorie and protein needs, ongoing  Goal:   Intake to meet >90% of estimated nutrition needs, met  Monitor:   PO & supplemental intake, weight, labs, I/O's  Assessment:   28 year old with complex medical history, paraplegic, presented to [facility] ED 12/10 with altered mental status and fevers. He was unresponsive in ED requiring intubation. He was noted to have profound leukocytosis.  Patient transferred from 29M-MICU to 2C-Stepdown 12/15.  Patient reports a good appetite.  PO intake 75-100% per flowsheet records.  Increased nutrient needs ongoing given wound healing.  Drinking his Glucerna Shakes.  Height: Ht Readings from Last 1 Encounters:  09/21/14 $RemoveB'5\' 8"'[id number]$  (1.727 m)    Weight Status:   Wt Readings from Last 1 Encounters:  09/23/14 131 lb 1.6 oz (59.467 kg)   09/16/14 123 lb 10.9 oz (56.1 kg)   Re-estimated needs:  Kcal: 2000-2200 Protein: 100-120 gm Fluid: >/= 2 L  Skin: unstageable pressure ulcer to right ischium, stage 2 pressure ulcer to sacrum; full thickness wound to left finger; several full thickness wounds to left foot  Diet Order: Carbohydrate Modified    Intake/Output Summary (Last 24 hours) at 09/24/14 1255 Last data filed at 09/24/14 1100  Gross per 24 hour  Intake    890 ml  Output    850 ml  Net     40 ml    Labs:   Recent Labs Lab 09/19/14 0421  09/21/14 0635 09/22/14 0641 09/22/14 0854 09/23/14 0500 09/24/14 0320  NA 143  < > 136* 132*  --  132* 131*  K 3.4*  < > 3.4* 3.8  --  3.5* 4.2  CL 113*  < > 102 97  --  96 96  CO2 21  < > 25 24  --  25 27  BUN 10  < > 9 12  --  12 15  CREATININE 0.59  < > 0.55 0.62  --  0.65 0.72  CALCIUM 9.4  < > 9.8 9.5  --  8.8 8.9  MG 1.5  --  1.5  --   --  1.6 1.7  PHOS 2.6  --  3.2  --    --  2.6  --   GLUCOSE 203*  < > 151* 433* 437* 265* 86  < > = values in this interval not displayed.   CBG (last 3)   Recent Labs  09/23/14 2104 09/24/14 0321 09/24/14 0759  GLUCAP 393* 75 210*    Scheduled Meds: . baclofen  10 mg Oral BID WC  . baclofen  20 mg Oral QHS  . ciprofloxacin  500 mg Oral BID  . collagenase   Topical Daily  . feeding supplement (GLUCERNA SHAKE)  237 mL Oral TID BM  . furosemide  20 mg Intravenous BID  . insulin aspart  0-15 Units Subcutaneous TID WC & HS  . insulin glargine  20 Units Subcutaneous Daily  . mupirocin cream   Topical BID  . pantoprazole sodium  40 mg Per Tube QHS  . pregabalin  100 mg Per Tube 3 times per day  . pregabalin  200 mg Per Tube QHS  . sodium chloride  10-40 mL Intracatheter Q12H    Continuous Infusions: . sodium chloride 50 mL/hr at 09/24/14 0830  Arthur Holms, RD, LDN Pager #: [phone] After-Hours Pager #: [phone]

## 2014-09-24 NOTE — Progress Notes (Signed)
RT went to gather overnight pulse ox. When entering room machine was unplugged. When spoke to the nurse she stated she had unplugged the machine at 00:00. Informed nurse RT would re-attempt test tonight.

## 2014-09-24 NOTE — Clinical Social Work Note (Signed)
Patient will discharge to home Anticipated discharge date:09/24/14 Family notified: family at bedside Transportation by [facility]- scheduled for 1:45  CSW signing off.  Domenica Reamer, [place] Social Worker [phone]

## 2014-09-24 NOTE — Discharge Summary (Signed)
Physician Discharge Summary  Patient ID: Jason [name] MRN: [id number] DOB/AGE: 11-Dec-1985 28 y.o.  Admit date: 09/16/2014 Discharge date: 09/24/2014    Discharge Diagnoses:  Acute Hypoxic Respiratory Failure Severe Sepsis secondary to HCAP, UTI and Sacral Osteomyelitis Hx HTN  Acute Kidney Injury  Hypokalemia  Hypomagnesemia  Hyperchloremic Acidosis  Transaminitis / Shock Liver Chronic Anemia Diabetes Mellitus  Acute Metabolic Encephalopathy  Seizure Hx CVA Hx with Spinal Cord Infarct                                                                          DISCHARGE PLAN BY DIAGNOSIS     Acute Hypoxic Respiratory Failure  Discharge Plan: -Resolved.  -Consider overnight oximetry as an outpatient vs sleep study  Severe Sepsis secondary to HCAP, UTI and Sacral Osteomyelitis Stage IV Sacral Decubitus  L Foot Stage IV ulcers (multiple) Hx HTN  Recent MSSA Bacteremia (completed abx for full 6 weeks 12/18).  Discharge Plan: -No role for surgical debridement at this time -Bactroban to L toes with foam dressing Q5 days + PRN  -Foam dressing to sacrum, barrier cream to buttocks, santyl to chemically debride non-viable tissue to R ischium.   -Low airloss bed, pressure reduction as able  -D/C PICC line prior to discharge  Acute Kidney Injury  Hypokalemia  Hypomagnesemia  Hyperchloremic Acidosis   Discharge Plan: -Resume home PRN lasix  -Follow up BMP PRN  Transaminitis / Shock Liver  Discharge Plan: -Resolved, no acute follow up  Chronic Anemia  Discharge Plan: -PRN monitoring of CBC  Diabetes Mellitus   Discharge Plan: -SSI  -Lantus 25 units QD  -pt instructed to record glucose and take with him to next visit   Acute Metabolic Encephalopathy  Seizure Hx Hx Spinal Stroke with Spinal Cord Infarct   Discharge Plan: -follow up with Dr. Naaman Plummer for outpatient rehab / upper body strengthening                   DISCHARGE SUMMARY   Jason [name] is a 28 y.o. 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  The family had noted decreased PO intake and fatigue.    The patient was significantly altered with respiratory distress in the ER with low oxygen saturations.  He was emergently intubated in the ER.  Initial eval noted leukocytosis, acute kidney injury, transaminitis, anemia  and concern for HCAP on CXR.  The patient was admitted to ICU, treated with broad spectrum antibiotics (multiple allergies noted) and volume resuscitaiton.  Prior PICC line was discontinued and new placed in RUE.  Blood pressures improved with IVF hydration.  MRI of left foot and sacrum were obtained and negative for osteo of the foot but positive for osteo in the sacrum.  Cultures were assessed and positive for Klebsiella UTI & MSSA PNA.  Blood cultures were negative.  He met extubation criteria  on 12/13 and was liberated from mechanical ventilation.  The patient made slow progress with return of labs to normal limits.  He was evaluated by PT & CIR with recommendations of outpatient rehab with Dr. Naaman Plummer.  Antibiotics were narrowed to CIPRO with planned stop date of 12/23.  Attempts were made to evaluate patient for nocturnal O2 need at patients request but he discontinued monitoring before it could be completed.  During day, he is on RA with saturations > 95%.  See discharge plans as above.    LAST WOC Wound Documentation:  Wound type: Sacrum/buttocks stage 2:4X4X.1cm, dark red with patchy open areas. Red and macerated. No odor or drainage, difficult to keep from becoming soiled related to frequent incontinent stools  since rectum is located close to the site.   Right ischium unstageable; 4X6X4cm, 100% yellow slough. Mod amt tan drainage with strong odor.   Scrotum and innerbuttocks with patchy areas of partail thickness skin loss; appearance consistent with moisture associated skin damage.  Left finger with 3rd digit near middle joint area; full thickness wound, 1X1cm 100% tightly adhered eschar. No odor or drainage.  Left foot with several full thickness wounds; appears r/t poor perfusion. 5th toe 2X1X.1cm, 100% red and moist. No odor or drainage.  4th toe extending to anterior foot 4X2X.1cm; 50% dry tightly adhered eschar to toe, 50% red and moist without odor or drainage.  Great toe 4X1X1.cm, 100% red and moist. No odor or drainage.  Dressing procedure/placement/frequency:  Eschar to left finger is dry and stable; no topical treatment indicated at this time. Bactroban to left toes and foot wound to promote moist healing. Float heels to reduce pressure. Foam dressing to sacrum to protect from soiling, barrier cream to buttocks.scrotum to repel moisture when incontinent. Santyl to chemically debride nonviable tissue to right ischium.  low-airloss bed to reduce pressure.              SIGNIFICANT DIAGNOSTIC STUDIES 12/10  MRI L Foot >> no evidence of osteo in foot 12/10  MRI Pelvis >> 5 x 4.7 x 6 cm complex multi-loculated peripherally enhancing fluid collection excision of decub down to ischial tuberosity, osseous changes concerning for osteomyelitis, diffuse muscle edema throughout pelvis 12/18  CXR >> improved bilateral lower lobe infiltrates  MICRO DATA  BCx2 12/10 >> neg UC 12/10> GNR>>>klebsiella Sputum 12/10>> MSSA  ANTIBIOTICS Vancomycin (preadmission)>>>12/13 Meropenem 12/10 >>>12/14 cipro 12/14>>>stop date in place (12/23) Diflucan12/10>>>12/12  CONSULTS Wound Ostomy RN Diabetes Coordinator  CIR  Physical Therapy   TUBES / LINES OETT 12/10 >>> 12/13 LUE PICC  (pta) >>>12/12 RUE PICC 12/12>>>  Discharge Exam: General: chronically ill young adult male in NAD Neuro: AAOx4, para, gross mother of UE's CV: s1s2 rrr, no m/r/g PULM: even/non-labored, CTA GI: NTND, bsx4 active Extremities: warm/dry, sacral & foot wounds unchanged.    Filed Vitals:   09/23/14 1943 09/23/14 2344 09/24/14 0403 09/24/14 0800  BP: 113/68 102/65 100/61 125/85  Pulse: 79 81 79 81  Temp: 99.6 F (37.6 C) 99.9 F (37.7 C) 99.9 F (37.7 C) 98.9 F (37.2 C)  TempSrc: Oral Oral Oral Oral  Resp: 16 17 18 21   Height:      Weight:      SpO2: 100% 96% 96% 95%     Discharge Labs  BMET  Recent Labs Lab 09/17/14 2000  09/19/14 0421 09/20/14 0500 09/21/14 0635 09/22/14 0641 09/22/14 0854 09/23/14 0500 09/24/14 0320  NA  --   < > 143 135* 136* 132*  --  132* 131*  K  --   < > 3.4* 3.9 3.4* 3.8  --  3.5* 4.2  CL  --   < > 113* 104 102 97  --  96 96  CO2  --   < > 21 22 25 24   --  25 27  GLUCOSE  --   < > 203* 93 151* 433* 437* 265* 86  BUN  --   < > 10 7 9 12   --  12 15  CREATININE  --   < > 0.59 0.56 0.55 0.62  --  0.65 0.72  CALCIUM  --   < > 9.4 10.3 9.8 9.5  --  8.8 8.9  MG 2.3  --  1.5  --  1.5  --   --  1.6 1.7  PHOS  --   --  2.6  --  3.2  --   --  2.6  --   < > = values in this interval not displayed.  CBC  Recent Labs Lab 09/20/14 0500 09/21/14 0635 09/24/14 0320  HGB 11.5* 10.6* 9.7*  HCT 34.3* 32.4* 30.0*  WBC 18.2* 18.2* 20.2*  PLT 586* 473* 376      Medication List    STOP taking these medications        fluconazole 200 MG tablet  Commonly known as:  DIFLUCAN     metaxalone 800 MG tablet  Commonly known as:  SKELAXIN     vancomycin 1,000 mg in sodium chloride 0.9 % 250 mL     vancomycin 1,250 mg in sodium chloride 0.9 % 250 mL     vancomycin 750 mg in sodium chloride 0.9 % 150 mL      TAKE these medications        albuterol (2.5 MG/3ML) 0.083% nebulizer solution  Commonly known as:  PROVENTIL  Take 3 mLs (2.5 mg  total) by nebulization every 4 (four) hours as needed for wheezing or shortness of breath.     ALPRAZolam 1 MG tablet  Commonly known as:  XANAX  Take 1 tablet (1 mg total) by mouth every 6 (six) hours as needed for anxiety.     amLODipine 2.5 MG tablet  Commonly known as:  NORVASC  Take 1 tablet (2.5 mg total) by mouth daily.     atorvastatin 20 MG tablet  Commonly known as:  LIPITOR  Take 20 mg by mouth daily.     baclofen 10 MG tablet  Commonly known as:  LIORESAL  Take 10-20 mg by mouth 3 (three) times daily. Takes 29m in morning and lunchtime and takes 247mat bedtime     bisacodyl 5 MG EC tablet  Commonly known as:  DULCOLAX  Take 1 tablet (5 mg total) by mouth 2 (two) times daily as needed for moderate constipation.     ciprofloxacin 500 MG tablet  Commonly known as:  CIPRO  Take 1 tablet (500 mg total) by mouth 2 (two) times daily.     collagenase ointment  Commonly known as:  SANTYL  Apply 1 application topically daily.     DEXILANT 60 MG capsule  Generic drug:  dexlansoprazole  Take 60 mg by mouth daily.     diclofenac sodium 1 % Gel  Commonly known as:  VOLTAREN  Apply 2 g topically daily as needed (for pain).     diphenoxylate-atropine 2.5-0.025 MG per tablet  Commonly known as:  LOMOTIL  Take 1 tablet by mouth 2 (two) times daily.  feeding supplement (GLUCERNA SHAKE) Liqd  Take 237 mLs by mouth 3 (three) times daily between meals.     feeding supplement (PRO-STAT SUGAR FREE 64) Liqd  Take 30 mLs by mouth daily.     furosemide 40 MG tablet  Commonly known as:  LASIX  Take 0.5 tablets (20 mg total) by mouth daily as needed for fluid.     glucagon 1 MG injection  Commonly known as:  GLUCAGON EMERGENCY  Inject 1 mg into the vein once as needed.     glucose blood test strip  Commonly known as:  ACCU-CHEK AVIVA PLUS  Use to check blood sugar 4 times per day     insulin aspart 100 UNIT/ML injection  Commonly known as:  novoLOG  Inject 5 Units  into the skin 3 (three) times daily with meals.     insulin glargine 100 UNIT/ML injection  Commonly known as:  LANTUS  Inject 0.25 mLs (25 Units total) into the skin daily.     lidocaine 2 % jelly  Commonly known as:  XYLOCAINE  Apply 1 application topically 4 (four) times daily as needed (pain).     loratadine 10 MG tablet  Commonly known as:  CLARITIN  Take 10 mg by mouth daily.     methocarbamol 750 MG tablet  Commonly known as:  ROBAXIN  Take 750 mg by mouth 4 (four) times daily as needed for muscle spasms.     metoCLOPramide 5 MG tablet  Commonly known as:  REGLAN  Take 1 tablet (5 mg total) by mouth 2 (two) times a week. And as needed for nausea     mupirocin cream 2 %  Commonly known as:  BACTROBAN  Apply topically daily. Apply Bactroban to left toes/foot wounds Q day, then cover with foam dressing.  Change foam dressing Q 5 days or PRN soiling.     ondansetron 8 MG tablet  Commonly known as:  ZOFRAN  Take 8 mg by mouth 2 (two) times daily as needed for nausea or vomiting.     oxyCODONE 15 MG immediate release tablet  Commonly known as:  ROXICODONE  Take 0.5 tablets (7.5 mg total) by mouth every 6 (six) hours as needed for pain.     polyethylene glycol powder powder  Commonly known as:  GLYCOLAX/MIRALAX  Take 17 g by mouth daily.     pregabalin 100 MG capsule  Commonly known as:  LYRICA  Take 100-200 mg by mouth 4 (four) times daily. Take 100 mg by mouth in the morning, take 100 mg by mouth in the afternoon, take 100 mg by mouth in the evening and take 200 mg by mouth at bedtime.     silver sulfADIAZINE 1 % cream  Commonly known as:  SILVADENE  Apply 1 application topically 2 (two) times a week. Tuesday and thursday       Follow-up Information    Follow up with FRY,STEPHEN A, MD. Schedule an appointment as soon as possible for a visit in 1 week.   Specialty:  Family Medicine   Why:  Hospital follow up   Contact information:   [place] [address] [phone]       Follow up with Meredith Staggers, MD.   Specialty:  Physical Medicine and Rehabilitation   Why:  For outpatient rehab   Contact information:   [address], [place]  [address] [phone]         Disposition:  Home.  No new home health needs identified.   Discharged Condition: Jason [name] has met maximum benefit of inpatient care and is medically stable and cleared for discharge.  Patient is pending follow up as above.      Time spent on disposition:  Greater than 35 minutes.   Signed: Noe Gens, NP-C [facility] Pgr: [phone] Office: [phone]    Baltazar Apo, MD, PhD 09/24/2014, 12:53 PM [facility] [phone] or if no answer [phone]

## 2014-09-27 ENCOUNTER — Telehealth: Payer: Self-pay | Admitting: Family Medicine

## 2014-09-27 ENCOUNTER — Ambulatory Visit: Payer: Medicaid Other | Admitting: Family Medicine

## 2014-09-27 NOTE — Telephone Encounter (Signed)
Patient came home from hospital on 09/24/14 and his mom states he's been sleeping a lot. Olin Hauser would like to talk about patient's orders.   Patient needs mupirocin cream (BACTROBAN) 2 % sent to [id number] - [place], [place] - [address] AT [place]

## 2014-09-27 NOTE — Care Management Note (Signed)
    Page 1 of 2   09/27/2014     8:14:06 AM CARE MANAGEMENT NOTE 09/27/2014  Patient:  Jason [name], Jason [name]   Account Number:  [id number]  Date Initiated:  09/16/2014  Documentation initiated by:  [facility]  Subjective/Objective Assessment:   Admitted with AMS and resp failure requiring intubation.     Action/Plan:   Anticipated DC Date:  09/24/2014   Anticipated DC Plan:  [place]  In-house referral  Clinical Social Worker      DC Planning Services  CM consult      Choice offered to / List presented to:  C-1 Patient        [place] arranged  HH-1 RN      [place].   Status of service:  Completed, signed off Medicare Important Message given?  NO (If response is "NO", the following Medicare IM given date fields will be blank) Date Medicare IM given:   Medicare IM given by:   Date Additional Medicare IM given:   Additional Medicare IM given by:    Discharge Disposition:  [place]  Per UR Regulation:  Reviewed for med. necessity/level of care/duration of stay  If discussed at Long Length of Stay Meetings, dates discussed:    Comments:  ContactSims, [place] Mother [phone]      Steed,[name] Significant other [phone]     Nels, Munn [phone]     09/24/14 [place] RN MSN BSN CCM Pt to d/c home and will need home health nursing for wound care, CIR declined admission and home therapies will not be covered by [facility].  Pt requests referral to [facility] as he has had their services previously.  Referral made.  09/21/14 [place] MSN BSN CCM Transferred to [place], CIR consult recommended by PT.  09-16-14 8:40am  Luz Lex, [place] Readmission - was at home with [facility] on IV antibiotics.  May need higher level initially on dc.

## 2014-09-28 MED ORDER — MUPIROCIN CALCIUM 2 % EX CREA: 1.0000 "application " | TOPICAL_CREAM | Freq: Two times a day (BID) | CUTANEOUS | Status: DC

## 2014-09-28 NOTE — Telephone Encounter (Signed)
I tried to reach Jason [name], left her a voice message. I will try again later to reach her.

## 2014-09-28 NOTE — Telephone Encounter (Signed)
Olin Hauser called back from [place] and would like Sunday Spillers to give her a call [phone]

## 2014-09-28 NOTE — Telephone Encounter (Signed)
Per Dr. Sarajane Jews, send in script for 30 grams with 1 refill, fax order to [facility] for oximetry evaluation, give verbal order for a UA & culture. I sent script e-scribe, faxed order to [facility], gave verbal orders to [facility] for the UA & culture.

## 2014-09-29 ENCOUNTER — Emergency Department (HOSPITAL_COMMUNITY): Payer: Medicaid Other

## 2014-09-29 ENCOUNTER — Inpatient Hospital Stay (HOSPITAL_COMMUNITY)
Admission: EM | Admit: 2014-09-29 | Discharge: 2014-10-14 | DRG: 853 | Disposition: A | Discharge status: Home Health | Payer: Medicaid Other | Attending: Internal Medicine | Admitting: Internal Medicine

## 2014-09-29 ENCOUNTER — Encounter (HOSPITAL_COMMUNITY): Payer: Self-pay | Admitting: *Deleted

## 2014-09-29 DIAGNOSIS — M549 Dorsalgia, unspecified: Secondary | ICD-10-CM | POA: Diagnosis present

## 2014-09-29 DIAGNOSIS — A4101 Sepsis due to Methicillin susceptible Staphylococcus aureus: Secondary | ICD-10-CM | POA: Insufficient documentation

## 2014-09-29 DIAGNOSIS — E11649 Type 2 diabetes mellitus with hypoglycemia without coma: Secondary | ICD-10-CM | POA: Diagnosis present

## 2014-09-29 DIAGNOSIS — N390 Urinary tract infection, site not specified: Secondary | ICD-10-CM | POA: Diagnosis present

## 2014-09-29 DIAGNOSIS — L02419 Cutaneous abscess of limb, unspecified: Secondary | ICD-10-CM | POA: Insufficient documentation

## 2014-09-29 DIAGNOSIS — K660 Peritoneal adhesions (postprocedural) (postinfection): Secondary | ICD-10-CM | POA: Diagnosis present

## 2014-09-29 DIAGNOSIS — E860 Dehydration: Secondary | ICD-10-CM | POA: Diagnosis present

## 2014-09-29 DIAGNOSIS — E875 Hyperkalemia: Secondary | ICD-10-CM | POA: Diagnosis present

## 2014-09-29 DIAGNOSIS — Q549 Hypospadias, unspecified: Secondary | ICD-10-CM

## 2014-09-29 DIAGNOSIS — D72829 Elevated white blood cell count, unspecified: Secondary | ICD-10-CM

## 2014-09-29 DIAGNOSIS — L89214 Pressure ulcer of right hip, stage 4: Secondary | ICD-10-CM | POA: Diagnosis present

## 2014-09-29 DIAGNOSIS — F17210 Nicotine dependence, cigarettes, uncomplicated: Secondary | ICD-10-CM | POA: Diagnosis present

## 2014-09-29 DIAGNOSIS — K219 Gastro-esophageal reflux disease without esophagitis: Secondary | ICD-10-CM | POA: Diagnosis present

## 2014-09-29 DIAGNOSIS — L03119 Cellulitis of unspecified part of limb: Secondary | ICD-10-CM | POA: Diagnosis present

## 2014-09-29 DIAGNOSIS — A419 Sepsis, unspecified organism: Secondary | ICD-10-CM

## 2014-09-29 DIAGNOSIS — B351 Tinea unguium: Secondary | ICD-10-CM | POA: Diagnosis present

## 2014-09-29 DIAGNOSIS — Z794 Long term (current) use of insulin: Secondary | ICD-10-CM | POA: Diagnosis not present

## 2014-09-29 DIAGNOSIS — J189 Pneumonia, unspecified organism: Secondary | ICD-10-CM

## 2014-09-29 DIAGNOSIS — D473 Essential (hemorrhagic) thrombocythemia: Secondary | ICD-10-CM | POA: Diagnosis present

## 2014-09-29 DIAGNOSIS — M869 Osteomyelitis, unspecified: Secondary | ICD-10-CM | POA: Diagnosis present

## 2014-09-29 DIAGNOSIS — L97529 Non-pressure chronic ulcer of other part of left foot with unspecified severity: Secondary | ICD-10-CM | POA: Diagnosis present

## 2014-09-29 DIAGNOSIS — I10 Essential (primary) hypertension: Secondary | ICD-10-CM | POA: Diagnosis present

## 2014-09-29 DIAGNOSIS — E1165 Type 2 diabetes mellitus with hyperglycemia: Secondary | ICD-10-CM | POA: Diagnosis present

## 2014-09-29 DIAGNOSIS — G8250 Quadriplegia, unspecified: Secondary | ICD-10-CM | POA: Diagnosis present

## 2014-09-29 DIAGNOSIS — IMO0001 Reserved for inherently not codable concepts without codable children: Secondary | ICD-10-CM

## 2014-09-29 DIAGNOSIS — G8929 Other chronic pain: Secondary | ICD-10-CM | POA: Diagnosis present

## 2014-09-29 DIAGNOSIS — E1140 Type 2 diabetes mellitus with diabetic neuropathy, unspecified: Secondary | ICD-10-CM | POA: Diagnosis present

## 2014-09-29 DIAGNOSIS — J9811 Atelectasis: Secondary | ICD-10-CM | POA: Diagnosis not present

## 2014-09-29 DIAGNOSIS — L8990 Pressure ulcer of unspecified site, unspecified stage: Secondary | ICD-10-CM

## 2014-09-29 DIAGNOSIS — L0291 Cutaneous abscess, unspecified: Secondary | ICD-10-CM

## 2014-09-29 DIAGNOSIS — E871 Hypo-osmolality and hyponatremia: Secondary | ICD-10-CM | POA: Diagnosis present

## 2014-09-29 DIAGNOSIS — L02415 Cutaneous abscess of right lower limb: Secondary | ICD-10-CM | POA: Diagnosis present

## 2014-09-29 DIAGNOSIS — N179 Acute kidney failure, unspecified: Secondary | ICD-10-CM | POA: Diagnosis present

## 2014-09-29 DIAGNOSIS — M4628 Osteomyelitis of vertebra, sacral and sacrococcygeal region: Secondary | ICD-10-CM

## 2014-09-29 DIAGNOSIS — G9511 Acute infarction of spinal cord (embolic) (nonembolic): Secondary | ICD-10-CM | POA: Diagnosis present

## 2014-09-29 DIAGNOSIS — L97409 Non-pressure chronic ulcer of unspecified heel and midfoot with unspecified severity: Secondary | ICD-10-CM

## 2014-09-29 DIAGNOSIS — N319 Neuromuscular dysfunction of bladder, unspecified: Secondary | ICD-10-CM | POA: Diagnosis present

## 2014-09-29 DIAGNOSIS — L89154 Pressure ulcer of sacral region, stage 4: Secondary | ICD-10-CM | POA: Diagnosis present

## 2014-09-29 DIAGNOSIS — D649 Anemia, unspecified: Secondary | ICD-10-CM | POA: Diagnosis present

## 2014-09-29 DIAGNOSIS — G9340 Encephalopathy, unspecified: Secondary | ICD-10-CM | POA: Diagnosis present

## 2014-09-29 DIAGNOSIS — E119 Type 2 diabetes mellitus without complications: Secondary | ICD-10-CM

## 2014-09-29 DIAGNOSIS — R509 Fever, unspecified: Secondary | ICD-10-CM

## 2014-09-29 DIAGNOSIS — E43 Unspecified severe protein-calorie malnutrition: Secondary | ICD-10-CM | POA: Diagnosis present

## 2014-09-29 LAB — URINALYSIS, ROUTINE W REFLEX MICROSCOPIC
BILIRUBIN URINE: NEGATIVE
Glucose, UA: >1000 mg/dL — AB
KETONES UR: 15 mg/dL — AB
Nitrite: NEGATIVE
Protein, ur: NEGATIVE mg/dL
Specific Gravity, Urine: 1.017 (ref 1.005–1.030)
UROBILINOGEN UA: 0.2 mg/dL (ref 0.0–1.0)
pH: 6 (ref 5.0–8.0)

## 2014-09-29 LAB — COMPREHENSIVE METABOLIC PANEL
ALT: 11 U/L (ref 0–53)
AST: 11 U/L (ref 0–37)
Albumin: 2.5 g/dL — ABNORMAL LOW (ref 3.5–5.2)
Alkaline Phosphatase: 76 U/L (ref 39–117)
Anion gap: 10 (ref 5–15)
BUN: 39 mg/dL — ABNORMAL HIGH (ref 6–23)
CO2: 17 mmol/L — ABNORMAL LOW (ref 19–32)
Calcium: 9.2 mg/dL (ref 8.4–10.5)
Chloride: 104 mEq/L (ref 96–112)
Creatinine, Ser: 1.47 mg/dL — ABNORMAL HIGH (ref 0.50–1.35)
GFR calc Af Amer: 72 mL/min — ABNORMAL LOW (ref >90)
GFR calc non Af Amer: 62 mL/min — ABNORMAL LOW (ref >90)
Glucose, Bld: 338 mg/dL — ABNORMAL HIGH (ref 70–99)
Potassium: 4.6 mmol/L (ref 3.5–5.1)
Sodium: 131 mmol/L — ABNORMAL LOW (ref 135–145)
Total Bilirubin: 0.5 mg/dL (ref 0.3–1.2)
Total Protein: 7.7 g/dL (ref 6.0–8.3)

## 2014-09-29 LAB — CBC WITH DIFFERENTIAL/PLATELET
Basophils Absolute: 0 10*3/uL (ref 0.0–0.1)
Basophils Relative: 0 % (ref 0–1)
Eosinophils Absolute: 0 10*3/uL (ref 0.0–0.7)
Eosinophils Relative: 0 % (ref 0–5)
HCT: 29.7 % — ABNORMAL LOW (ref 39.0–52.0)
Hemoglobin: 9.6 g/dL — ABNORMAL LOW (ref 13.0–17.0)
Lymphocytes Relative: 18 % (ref 12–46)
Lymphs Abs: 4.9 10*3/uL — ABNORMAL HIGH (ref 0.7–4.0)
MCH: 27.1 pg (ref 26.0–34.0)
MCHC: 32.3 g/dL (ref 30.0–36.0)
MCV: 83.9 fL (ref 78.0–100.0)
Monocytes Absolute: 2.2 10*3/uL — ABNORMAL HIGH (ref 0.1–1.0)
Monocytes Relative: 8 % (ref 3–12)
Neutro Abs: 20.1 10*3/uL — ABNORMAL HIGH (ref 1.7–7.7)
Neutrophils Relative %: 74 % (ref 43–77)
Platelets: 477 10*3/uL — ABNORMAL HIGH (ref 150–400)
RBC: 3.54 MIL/uL — ABNORMAL LOW (ref 4.22–5.81)
RDW: 16.3 % — ABNORMAL HIGH (ref 11.5–15.5)
WBC: 27.2 10*3/uL — ABNORMAL HIGH (ref 4.0–10.5)

## 2014-09-29 LAB — CBG MONITORING, ED
GLUCOSE-CAPILLARY: 432 mg/dL — AB (ref 70–99)
GLUCOSE-CAPILLARY: 404 mg/dL — AB (ref 70–99)

## 2014-09-29 LAB — GLUCOSE, CAPILLARY
GLUCOSE-CAPILLARY: 249 mg/dL — AB (ref 70–99)
Glucose-Capillary: 316 mg/dL — ABNORMAL HIGH (ref 70–99)
Glucose-Capillary: 272 mg/dL — ABNORMAL HIGH (ref 70–99)
Glucose-Capillary: 195 mg/dL — ABNORMAL HIGH (ref 70–99)

## 2014-09-29 LAB — URINE MICROSCOPIC-ADD ON

## 2014-09-29 LAB — I-STAT CG4 LACTIC ACID, ED
Lactic Acid, Venous: 1.11 mmol/L (ref 0.5–2.2)
Lactic Acid, Venous: 1.66 mmol/L (ref 0.5–2.2)
Lactic Acid, Venous: 1.62 mmol/L (ref 0.5–2.2)

## 2014-09-29 LAB — PHOSPHORUS: Phosphorus: 3.2 mg/dL (ref 2.3–4.6)

## 2014-09-29 LAB — MAGNESIUM: MAGNESIUM: 1.5 mg/dL (ref 1.5–2.5)

## 2014-09-29 MED ORDER — VANCOMYCIN HCL IN DEXTROSE 1-5 GM/200ML-% IV SOLN
1000.0000 mg | Freq: Once | INTRAVENOUS | Status: AC
  Administered 2014-09-29: 1000 mg via INTRAVENOUS
  Filled 2014-09-29: qty 200

## 2014-09-29 MED ORDER — PREGABALIN 100 MG PO CAPS
100.0000 mg | ORAL_CAPSULE | Freq: Four times a day (QID) | ORAL | Status: DC
  Administered 2014-09-30 – 2014-10-14 (×56): 100 mg via ORAL
  Filled 2014-09-29 (×64): qty 1

## 2014-09-29 MED ORDER — METOCLOPRAMIDE HCL 5 MG PO TABS
5.0000 mg | ORAL_TABLET | ORAL | Status: DC
  Administered 2014-09-30 – 2014-10-14 (×4): 5 mg via ORAL
  Filled 2014-09-29 (×6): qty 1

## 2014-09-29 MED ORDER — PANTOPRAZOLE SODIUM 40 MG PO TBEC
40.0000 mg | DELAYED_RELEASE_TABLET | Freq: Every day | ORAL | Status: DC
  Administered 2014-09-30 – 2014-10-14 (×13): 40 mg via ORAL
  Filled 2014-09-29 (×12): qty 1

## 2014-09-29 MED ORDER — DEXTROSE 5 % IV SOLN
2.0000 g | INTRAVENOUS | Status: AC
  Administered 2014-09-29: 2 g via INTRAVENOUS
  Filled 2014-09-29: qty 2

## 2014-09-29 MED ORDER — OXYCODONE HCL 5 MG PO TABS
7.5000 mg | ORAL_TABLET | Freq: Four times a day (QID) | ORAL | Status: DC | PRN
Start: 2014-09-29 — End: 2014-09-29

## 2014-09-29 MED ORDER — VANCOMYCIN HCL IN DEXTROSE 1-5 GM/200ML-% IV SOLN: 1000.0000 mg | INTRAVENOUS | Status: DC

## 2014-09-29 MED ORDER — HEPARIN SODIUM (PORCINE) 5000 UNIT/ML IJ SOLN
5000.0000 [IU] | Freq: Three times a day (TID) | INTRAMUSCULAR | Status: AC
  Administered 2014-09-30 – 2014-10-11 (×34): 5000 [IU] via SUBCUTANEOUS
  Filled 2014-09-29 (×43): qty 1

## 2014-09-29 MED ORDER — OXYCODONE HCL 5 MG PO TABS
7.5000 mg | ORAL_TABLET | Freq: Four times a day (QID) | ORAL | Status: DC | PRN
  Administered 2014-09-29 – 2014-10-14 (×19): 7.5 mg via ORAL
  Filled 2014-09-29 (×20): qty 2

## 2014-09-29 MED ORDER — SODIUM CHLORIDE 0.9 % IV SOLN
INTRAVENOUS | Status: DC
  Administered 2014-09-29: 3.7 [IU]/h via INTRAVENOUS
  Filled 2014-09-29: qty 2.5

## 2014-09-29 MED ORDER — SODIUM CHLORIDE 0.9 % IV BOLUS (SEPSIS)
1000.0000 mL | INTRAVENOUS | Status: AC
  Administered 2014-09-29 (×2): 1000 mL via INTRAVENOUS

## 2014-09-29 MED ORDER — INSULIN GLARGINE 100 UNIT/ML ~~LOC~~ SOLN
25.0000 [IU] | Freq: Every day | SUBCUTANEOUS | Status: DC
  Administered 2014-09-30 – 2014-10-04 (×5): 25 [IU] via SUBCUTANEOUS
  Filled 2014-09-29 (×7): qty 0.25

## 2014-09-29 MED ORDER — SODIUM CHLORIDE 0.9 % IJ SOLN
3.0000 mL | Freq: Two times a day (BID) | INTRAMUSCULAR | Status: DC
  Administered 2014-09-30 – 2014-10-10 (×15): 3 mL via INTRAVENOUS

## 2014-09-29 MED ORDER — DEXTROSE 5 % IV SOLN
1.0000 g | Freq: Three times a day (TID) | INTRAVENOUS | Status: DC
  Administered 2014-09-30 – 2014-10-01 (×6): 1 g via INTRAVENOUS
  Filled 2014-09-29 (×8): qty 1

## 2014-09-29 MED ORDER — METHOCARBAMOL 750 MG PO TABS
750.0000 mg | ORAL_TABLET | Freq: Four times a day (QID) | ORAL | Status: DC | PRN
  Administered 2014-09-30 – 2014-10-13 (×19): 750 mg via ORAL
  Filled 2014-09-29 (×23): qty 1

## 2014-09-29 MED ORDER — SODIUM CHLORIDE 0.9 % IV SOLN
INTRAVENOUS | Status: DC
  Administered 2014-09-30: 05:00:00 via INTRAVENOUS

## 2014-09-29 MED ORDER — ACETAMINOPHEN 650 MG RE SUPP
650.0000 mg | Freq: Once | RECTAL | Status: AC
  Administered 2014-09-29: 650 mg via RECTAL
  Filled 2014-09-29: qty 1

## 2014-09-29 MED ORDER — DEXTROSE 5 % IV SOLN
1.0000 g | Freq: Three times a day (TID) | INTRAVENOUS | Status: DC
  Filled 2014-09-29 (×2): qty 1

## 2014-09-29 MED ORDER — SODIUM CHLORIDE 0.9 % IV SOLN
1000.0000 mL | INTRAVENOUS | Status: DC
  Administered 2014-09-29 (×2): 1000 mL via INTRAVENOUS

## 2014-09-29 MED ORDER — ALBUTEROL SULFATE (2.5 MG/3ML) 0.083% IN NEBU: 2.5000 mg | INHALATION_SOLUTION | RESPIRATORY_TRACT | Status: DC | PRN

## 2014-09-29 MED ORDER — ACETAMINOPHEN 325 MG PO TABS
650.0000 mg | ORAL_TABLET | Freq: Four times a day (QID) | ORAL | Status: DC | PRN
Start: 2014-09-29 — End: 2014-10-15
  Administered 2014-09-30: 650 mg via ORAL
  Filled 2014-09-29: qty 2

## 2014-09-29 MED ORDER — MORPHINE SULFATE 2 MG/ML IJ SOLN
1.0000 mg | INTRAMUSCULAR | Status: AC | PRN
  Administered 2014-09-29 – 2014-09-30 (×4): 1 mg via INTRAVENOUS
  Filled 2014-09-29 (×4): qty 1

## 2014-09-29 MED ORDER — VANCOMYCIN HCL IN DEXTROSE 1-5 GM/200ML-% IV SOLN
1000.0000 mg | INTRAVENOUS | Status: DC
  Filled 2014-09-29: qty 200

## 2014-09-29 MED ORDER — INSULIN ASPART 100 UNIT/ML ~~LOC~~ SOLN
0.0000 [IU] | Freq: Three times a day (TID) | SUBCUTANEOUS | Status: DC
  Administered 2014-09-30: 5 [IU] via SUBCUTANEOUS
  Administered 2014-09-30: 2 [IU] via SUBCUTANEOUS

## 2014-09-29 MED ORDER — ACETAMINOPHEN 650 MG RE SUPP
650.0000 mg | Freq: Four times a day (QID) | RECTAL | Status: DC | PRN
Start: 2014-09-29 — End: 2014-10-15

## 2014-09-29 MED ORDER — ONDANSETRON HCL 4 MG PO TABS
8.0000 mg | ORAL_TABLET | Freq: Two times a day (BID) | ORAL | Status: DC | PRN
  Administered 2014-09-30: 8 mg via ORAL
  Filled 2014-09-29: qty 2

## 2014-09-29 MED ORDER — GLUCERNA SHAKE PO LIQD
237.0000 mL | Freq: Three times a day (TID) | ORAL | Status: DC
  Administered 2014-09-30 – 2014-10-14 (×30): 237 mL via ORAL

## 2014-09-29 NOTE — H&P (Signed)
[facility] History and Physical  Jason [name] YHC:[id number] DOB: [birth year] DOA: 09/29/2014  Referring physician: PA: Noland Fordyce PCP: [name], MD   Chief Complaint: Weakness and shortness of breath  HPI: Jason [name] is a 28 y.o. 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 Who presents to the ED complaining of the above symptoms. The mother states that after discharge he did not take any more antibiotics. Reportedly on EMR patient has a history of MSSA bacteremia of which mother denies patient getting full 6 week antibiotic course. The patient is unable to provide history as he is currently hypersomnolent and much of the history is obtained from EMR and mother at bedside. Mother states that after his discharge the patient has progressively gotten worse   Review of Systems:  Unable to obtain due to hypersomnolence   Past Medical History  Diagnosis Date  . GERD (gastroesophageal reflux disease)   . Asthma   . Hx MRSA infection     on face  . Gastroparesis   . Diabetic neuropathy   . Seizures   . Stroke     spinal stroke in 4/15  . Diabetes mellitus     sees Dr. Loanne Drilling   . Family history of anesthesia complication     Pt mother can't have epidural procedures  . Dysrhythmia   . Pneumonia   . Arthritis   . Fibromyalgia    Past Surgical History  Procedure Laterality Date  . Tonsillectomy    . Multiple extractions with alveoloplasty N/A 08/03/2014    Procedure: MULTIPLE EXTRACTIONS;  Surgeon: Gae Bon, DDS;  Location: [place];  Service: Oral Surgery;  Laterality: N/A;  . Tee without cardioversion N/A 08/17/2014    Procedure: TRANSESOPHAGEAL ECHOCARDIOGRAM (TEE);  Surgeon: Dorothy Spark, MD;  Location: [facility] ENDOSCOPY;  Service: Cardiovascular;  Laterality: N/A;   Social History:  reports that he has been smoking Cigars and Cigarettes.  He has been  smoking about 0.00 packs per day. He has never used smokeless tobacco. He reports that he does not drink alcohol or use illicit drugs.  Allergies  Allergen Reactions  . Cefuroxime Axetil Anaphylaxis  . Penicillins Anaphylaxis and Other (See Comments)    ?can take amoxicillin?  [name] [Benzonatate] Anaphylaxis  . Shellfish Allergy Itching and Other (See Comments)    Took benadryl to alleviate reaction    Family History  Problem Relation Age of Onset  . Diabetes Father   . Hypertension Father   . Asthma      fhx  . Hypertension      fhx  . Stroke      fhx  . Heart disease Mother      Prior to Admission medications   Medication Sig Start Date End Date Taking? Authorizing Provider  albuterol (PROVENTIL) (2.5 MG/3ML) 0.083% nebulizer solution Take 3 mLs (2.5 mg total) by nebulization every 4 (four) hours as needed for wheezing or shortness of breath. 04/27/14  Yes [name], MD  ALPRAZolam Duanne Moron) 1 MG tablet Take 1 tablet (1 mg total) by mouth every 6 (six) hours as needed for anxiety. 08/27/14  Yes [name], MD  amLODipine (NORVASC) 2.5 MG tablet Take 1 tablet (2.5 mg total) by mouth daily. 07/30/14  Yes Shanker Kristeen Mans, MD  atorvastatin (LIPITOR) 20 MG tablet Take 20 mg by mouth daily. 07/20/14  Yes Historical Provider, MD  baclofen (LIORESAL) 10 MG  tablet Take 10-20 mg by mouth 3 (three) times daily. Takes 10mg  in morning and lunchtime and takes 20mg  at bedtime   Yes Historical Provider, MD  collagenase (SANTYL) ointment Apply 1 application topically daily. 08/30/14  Yes [name], MD  dexlansoprazole (DEXILANT) 60 MG capsule Take 60 mg by mouth daily.   Yes Historical Provider, MD  diclofenac sodium (VOLTAREN) 1 % GEL Apply 2 g topically daily as needed (for pain).   Yes Historical Provider, MD  diphenoxylate-atropine (LOMOTIL) 2.5-0.025 MG per tablet Take 1 tablet by mouth 2 (two) times daily. Patient taking differently: Take 1 tablet by mouth 2 (two) times  daily as needed for diarrhea or loose stools.  07/21/14  Yes Willia Craze, NP  feeding supplement, GLUCERNA SHAKE, (GLUCERNA SHAKE) LIQD Take 237 mLs by mouth 3 (three) times daily between meals. 08/24/14  Yes Orson Eva, MD  fluconazole (DIFLUCAN) 200 MG tablet Take 200 mg by mouth daily. Started 12.20.15 for 30 days 09/15/14  Yes Historical Provider, MD  furosemide (LASIX) 40 MG tablet Take 0.5 tablets (20 mg total) by mouth daily as needed for fluid. 07/30/14  Yes Shanker Kristeen Mans, MD  glucagon (GLUCAGON EMERGENCY) 1 MG injection Inject 1 mg into the vein once as needed. 04/20/14  Yes [name], MD  insulin aspart (NOVOLOG) 100 UNIT/ML injection Inject 5 Units into the skin 3 (three) times daily with meals. 07/21/14  Yes Renato Shin, MD  insulin glargine (LANTUS) 100 UNIT/ML injection Inject 0.25 mLs (25 Units total) into the skin daily. 09/24/14  Yes Donita Brooks, NP  loratadine (CLARITIN) 10 MG tablet Take 10 mg by mouth daily.    Yes Historical Provider, MD  methocarbamol (ROBAXIN) 750 MG tablet Take 750 mg by mouth 4 (four) times daily as needed for muscle spasms.  07/07/14  Yes Historical Provider, MD  metoCLOPramide (REGLAN) 5 MG tablet Take 1 tablet (5 mg total) by mouth 2 (two) times a week. And as needed for nausea 07/01/14  Yes Meredith Staggers, MD  ondansetron [facility]) 8 MG tablet Take 8 mg by mouth 2 (two) times daily as needed for nausea or vomiting.  07/07/14  Yes Historical Provider, MD  oxyCODONE (ROXICODONE) 15 MG immediate release tablet Take 0.5 tablets (7.5 mg total) by mouth every 6 (six) hours as needed for pain. 09/24/14  Yes Donita Brooks, NP  polyethylene glycol powder (GLYCOLAX/MIRALAX) powder Take 17 g by mouth daily. 09/15/14  Yes [name], MD  pregabalin (LYRICA) 100 MG capsule Take 100-200 mg by mouth 4 (four) times daily. Take 100 mg by mouth in the morning, take 100 mg by mouth in the afternoon, take 100 mg by mouth in the evening and take 200 mg by mouth at  bedtime.   Yes Historical Provider, MD  Amino Acids-Protein Hydrolys (FEEDING SUPPLEMENT, PRO-STAT SUGAR FREE 64,) LIQD Take 30 mLs by mouth daily. Patient not taking: Reported on 09/29/2014 08/24/14   Orson Eva, MD  bisacodyl (DULCOLAX) 5 MG EC tablet Take 1 tablet (5 mg total) by mouth 2 (two) times daily as needed for moderate constipation. Patient not taking: Reported on 09/29/2014 09/15/14   [name], MD  ciprofloxacin (CIPRO) 500 MG tablet Take 1 tablet (500 mg total) by mouth 2 (two) times daily. Patient not taking: Reported on 09/29/2014 09/24/14 09/29/14  Donita Brooks, NP  glucose blood (ACCU-CHEK AVIVA PLUS) test strip Use to check blood sugar 4 times per day 07/21/14   Hilliard Clark  Loanne Drilling, MD  mupirocin cream (BACTROBAN) 2 % Apply topically daily. Apply Bactroban to left toes/foot wounds Q day, then cover with foam dressing.  Change foam dressing Q 5 days or PRN soiling. 09/24/14   Donita Brooks, NP  mupirocin cream (BACTROBAN) 2 % Apply 1 application topically 2 (two) times daily. 09/28/14   [name], MD   Physical Exam: Filed Vitals:   09/29/14 1435 09/29/14 1500 09/29/14 1517 09/29/14 1530  BP:  104/67 111/67 107/68  Pulse:  102 99 98  Temp: 98.9 F (37.2 C)     TempSrc: Oral     Resp:  11 10 10   SpO2:  99% 100% 99%    Wt Readings from Last 3 Encounters:  09/23/14 59.467 kg (131 lb 1.6 oz)  08/13/14 58.7 kg (129 lb 6.6 oz)  08/03/14 63.05 kg (139 lb)    General:  Resting. Minimal response to examiner Eyes: PERRL, normal lids, irises & conjunctiva ENT: grossly normal hearing, lips & tongue Neck: no LAD, masses or thyromegaly Cardiovascular: RRR, no m/r/g. No LE edema. Respiratory: CTA bilaterally, no w/r/r. Normal respiratory effort. Abdomen: soft, nt, nd Skin: no rash or induration seen on limited exam. Was not able to examine sacrum Musculoskeletal: no cyanosis Psychiatric: Unable to assess due to hypersomnolence Neurologic: No facial asymmetry. Unable to  fully assess due to hypersomnolence          Labs on Admission:  Basic Metabolic Panel:  Recent Labs Lab 09/23/14 0500 09/24/14 0320 09/29/14 1221  NA 132* 131* 131*  K 3.5* 4.2 4.6  CL 96 96 104  CO2 25 27 17*  GLUCOSE 265* 86 338*  BUN 12 15 39*  CREATININE 0.65 0.72 1.47*  CALCIUM 8.8 8.9 9.2  MG 1.6 1.7  --   PHOS 2.6  --   --    Liver Function Tests:  Recent Labs Lab 09/29/14 1221  AST 11  ALT 11  ALKPHOS 76  BILITOT 0.5  PROT 7.7  ALBUMIN 2.5*   No results for input(s): LIPASE, AMYLASE in the last 168 hours. No results for input(s): AMMONIA in the last 168 hours. CBC:  Recent Labs Lab 09/24/14 0320 09/29/14 1221  WBC 20.2* 27.2*  NEUTROABS  --  20.1*  HGB 9.7* 9.6*  HCT 30.0* 29.7*  MCV 81.1 83.9  PLT 376 477*   Cardiac Enzymes: No results for input(s): CKTOTAL, CKMB, CKMBINDEX, TROPONINI in the last 168 hours.  BNP (last 3 results)  Recent Labs  10/02/13 0445 01/29/14 2113  PROBNP 2451.0* 1343.0*   CBG:  Recent Labs Lab 09/23/14 1322 09/23/14 1625 09/23/14 2104 09/24/14 0321 09/24/14 0759  GLUCAP 263* 301* 393* 75 210*    Radiological Exams on Admission: Dg Chest Port 1 View  09/29/2014   CLINICAL DATA:  Shortness of Breath  EXAM: PORTABLE CHEST - 1 VIEW  COMPARISON:  September 24, 2014  FINDINGS: There is persistent left lower lobe consolidation. Right lung is now clear. Heart size and pulmonary vascularity are normal. No adenopathy. No bone lesions.  IMPRESSION: Persistent left lower lobe consolidation. Right lung clear. Lungs elsewhere clear. No change in cardiac silhouette.   Electronically Signed   By: Lowella Grip M.D.   On: 09/29/2014 12:44    Assessment/Plan   Sepsis - Given many sources will consult ID. -  Patient will be placed on broad-spectrum antibiotics. - Wound care consult for sacral decub ulcer -  Monitor WBC levels  Active Problems:   GERD -Will stable  continue Protonix    Protein-calorie  malnutrition, severe -Continue glucerna while in house - RD to evaluate    IDDM (insulin dependent diabetes mellitus) - diabetic diet - SSI and home lantus regimen    Spinal cord infarction (history of) - Will continue home regimen antispasmodics   History of  UTI (lower urinary tract infection) - ED physician that this is source. Currently no urinalysis or urine culture available. Currently pending  Hyponatremia - Will place on normal saline - reassess next am.  Code Status: full DVT Prophylaxis: heparin Family Communication: Discussed with Mother at bedside Disposition Plan: Stepdown unit  Time spent: > 55 minutes  Velvet Bathe [facility] Pager [phone]

## 2014-09-29 NOTE — ED Notes (Signed)
IV team at bedside 

## 2014-09-29 NOTE — Progress Notes (Signed)
ANTIBIOTIC CONSULT NOTE - INITIAL  Pharmacy Consult for vancomycin + aztreonam Indication: rule out pneumonia  Allergies  Allergen Reactions  . Cefuroxime Axetil Anaphylaxis  . Penicillins Anaphylaxis and Other (See Comments)    ?can take amoxicillin?  [name] [Benzonatate] Anaphylaxis  . Shellfish Allergy Itching and Other (See Comments)    Took benadryl to alleviate reaction    Patient Measurements:   Adjusted Body Weight:   Vital Signs: Temp: 103.2 F (39.6 C) (12/23 1210) Temp Source: Rectal (12/23 1210) BP: 115/54 mmHg (12/23 1230) Pulse Rate: 125 (12/23 1230) Intake/Output from previous day:   Intake/Output from this shift:    Labs:  Recent Labs  09/29/14 1221  WBC 27.2*  HGB 9.6*  PLT 477*  CREATININE 1.47*   Estimated Creatinine Clearance: 61.8 mL/min (by C-G formula based on Cr of 1.47). No results for input(s): VANCOTROUGH, VANCOPEAK, VANCORANDOM, GENTTROUGH, GENTPEAK, GENTRANDOM, TOBRATROUGH, TOBRAPEAK, TOBRARND, AMIKACINPEAK, AMIKACINTROU, AMIKACIN in the last 72 hours.   Microbiology: Recent Results (from the past 720 hour(s))  Blood Culture (routine x 2)     Status: None   Collection Time: 09/16/14  3:25 AM  Result Value Ref Range Status   Specimen Description BLOOD RIGHT ARM  Final   Special Requests BOTTLES DRAWN AEROBIC AND ANAEROBIC 10CC  Final   Culture  Setup Time   Final    09/16/2014 08:58 Performed at [facility]   Final    NO GROWTH 5 DAYS Performed at [facility]    Report Status 09/22/2014 FINAL  Final  Blood Culture (routine x 2)     Status: None   Collection Time: 09/16/14  3:30 AM  Result Value Ref Range Status   Specimen Description BLOOD LEFT ARM  Final   Special Requests BOTTLES DRAWN AEROBIC ONLY 10CC  Final   Culture  Setup Time   Final    09/16/2014 08:58 Performed at [place]   Final    NO GROWTH 5 DAYS Performed at [facility]    Report Status  09/22/2014 FINAL  Final  Urine culture     Status: None   Collection Time: 09/16/14  4:09 AM  Result Value Ref Range Status   Specimen Description URINE, RANDOM  Final   Special Requests NONE  Final   Culture  Setup Time   Final    09/16/2014 08:45 Performed at [place]   Final    >=100,000 COLONIES/ML Performed at [facility]    Culture   Final    KLEBSIELLA SPECIES Performed at [facility]    Report Status 09/18/2014 FINAL  Final   Organism ID, Bacteria KLEBSIELLA SPECIES  Final      Susceptibility   Klebsiella species - MIC*    AMPICILLIN >=32 RESISTANT Resistant     CEFAZOLIN <=4 SENSITIVE Sensitive     CEFTRIAXONE <=1 SENSITIVE Sensitive     CIPROFLOXACIN <=0.25 SENSITIVE Sensitive     GENTAMICIN <=1 SENSITIVE Sensitive     NITROFURANTOIN <=16 SENSITIVE Sensitive     TOBRAMYCIN <=1 SENSITIVE Sensitive     TRIMETH/SULFA <=20 SENSITIVE Sensitive     PIP/TAZO 8 SENSITIVE Sensitive     * KLEBSIELLA SPECIES  MRSA PCR Screening     Status: None   Collection Time: 09/16/14  7:08 AM  Result Value Ref Range Status   MRSA by PCR NEGATIVE NEGATIVE Final    Comment:  The GeneXpert MRSA Assay (FDA approved for NASAL specimens only), is one component of a comprehensive MRSA colonization surveillance program. It is not intended to diagnose MRSA infection nor to guide or monitor treatment for MRSA infections.   Culture, respiratory (tracheal aspirate)     Status: None   Collection Time: 09/17/14  6:19 AM  Result Value Ref Range Status   Specimen Description TRACHEAL ASPIRATE  Final   Special Requests NONE  Final   Gram Stain   Final    FEW WBC PRESENT,BOTH PMN AND MONONUCLEAR RARE SQUAMOUS EPITHELIAL CELLS PRESENT NO ORGANISMS SEEN Performed at [facility]    Culture   Final    FEW STAPHYLOCOCCUS AUREUS Note: RIFAMPIN AND GENTAMICIN SHOULD NOT BE USED AS SINGLE DRUGS FOR TREATMENT OF STAPH  INFECTIONS. Performed at [facility]    Report Status 09/20/2014 FINAL  Final   Organism ID, Bacteria STAPHYLOCOCCUS AUREUS  Final      Susceptibility   Staphylococcus aureus - MIC*    CLINDAMYCIN >=8 RESISTANT Resistant     ERYTHROMYCIN >=8 RESISTANT Resistant     GENTAMICIN <=0.5 SENSITIVE Sensitive     LEVOFLOXACIN 0.25 SENSITIVE Sensitive     OXACILLIN 1 SENSITIVE Sensitive     PENICILLIN >=0.5 RESISTANT Resistant     RIFAMPIN <=0.5 SENSITIVE Sensitive     TRIMETH/SULFA <=10 SENSITIVE Sensitive     VANCOMYCIN 1 SENSITIVE Sensitive     TETRACYCLINE >=16 RESISTANT Resistant     MOXIFLOXACIN <=0.25 SENSITIVE Sensitive     * FEW STAPHYLOCOCCUS AUREUS  Clostridium Difficile by PCR     Status: None   Collection Time: 09/21/14 10:10 PM  Result Value Ref Range Status   C difficile by pcr NEGATIVE NEGATIVE Final    Medical History: Past Medical History  Diagnosis Date  . GERD (gastroesophageal reflux disease)   . Asthma   . Hx MRSA infection     on face  . Gastroparesis   . Diabetic neuropathy   . Seizures   . Stroke     spinal stroke in 4/15  . Diabetes mellitus     sees Dr. Loanne Drilling   . Family history of anesthesia complication     Pt mother can't have epidural procedures  . Dysrhythmia   . Pneumonia   . Arthritis   . Fibromyalgia     Medications:  Anti-infectives    Start     Dose/Rate Route Frequency Ordered Stop   09/30/14 1400  vancomycin (VANCOCIN) IVPB 1000 mg/200 mL premix     1,000 mg200 mL/hr over 60 Minutes Intravenous Every 24 hours 09/29/14 1326     09/29/14 2200  aztreonam (AZACTAM) 1 g in dextrose 5 % 50 mL IVPB     1 g100 mL/hr over 30 Minutes Intravenous 3 times per day 09/29/14 1326     09/29/14 1300  aztreonam (AZACTAM) 2 g in dextrose 5 % 50 mL IVPB     2 g100 mL/hr over 30 Minutes Intravenous STAT 09/29/14 1239 09/30/14 1300   09/29/14 1245  vancomycin (VANCOCIN) IVPB 1000 mg/200 mL premix     1,000 mg200 mL/hr over 60 Minutes  Intravenous  Once 09/29/14 1239       Assessment: 67 yom with hx of paraplegia presented to the ED with weakness and SOB. He was recently discharged on cipro. Now to broaden therapy to vancomycin + aztreonam (PCN allergy). Pt with Tmax of 103.2 and WBC 27.2. Scr is elevated at 1.47. Has been on multiple  vancomycin doses recently requiring adjustment d/t renal dysfunction. Recent osteo and bacteremia.   Vanc 12/23>> Aztreo 12/23>>  Goal of Therapy:  Vancomycin trough level 15-20 mcg/ml  Plan:  1. Vancomycin 1gm IV Q24H 2. Aztreonam 1gm IV Q8H 3. F/u renal fxn, C&S, clinical status and trough atSS  Doshie Maggi, Rande Lawman 09/29/2014,1:26 PM

## 2014-09-29 NOTE — ED Notes (Signed)
Attmepted x2 to start a 2nd IV, unable to obtain.  Pt tolerated well.

## 2014-09-29 NOTE — ED Notes (Signed)
Belfi, MD aware of sediment in urinary cath & gives a verbal order to replace foley cath

## 2014-09-29 NOTE — ED Notes (Signed)
Pt in from home via [facility], per report pt c/o weakness onset x 3 days, pt c/o SOB today, pt received 10 mg Albuterol, 0.5 mg Atrovent, 350 mL NS, pt in ST prior to arrival, pt paraplegic r/t CVA 2012, pt alert to normal, pt A&O x4, follows commands, speaks in complete sentences

## 2014-09-29 NOTE — ED Provider Notes (Signed)
CSN: [id number]     Arrival date & time 09/29/14  1150 History   First MD Initiated Contact with Patient 09/29/14 1153     Chief Complaint  Patient presents with  . Shortness of Breath  . Weakness     (Consider location/radiation/quality/duration/timing/severity/associated sxs/prior Treatment) Patient is a 28 y.o. male presenting with shortness of breath and weakness. The history is provided by the patient and the EMS personnel. No language interpreter was used.  Shortness of Breath Associated symptoms: fever   Associated symptoms: no abdominal pain, no chest pain, no cough and no vomiting   Weakness Associated symptoms include arthralgias, chills, fatigue, a fever, myalgias and weakness ( generalized). Pertinent negatives include no abdominal pain, chest pain, coughing, nausea or vomiting.   Pt is a 28yo male with hx of paraplegia r/t spinal stroke, DM, MRSA, gastroparesis, seizures, arthritis, fibromyalgia, admitted on 09/16/14 discharged home 12/18.15. Pt was septic at the time, tx for HCAP, scaral osteomyeltitis and urosepsis.  Today, pt presenting to ED with c/o generalized weakness x3 days, gradually worsening and SOB that started today, associated with decreased appetite, fever, chills, fatigue and body shakes which pt states is not normal for him.  Pt received 10mg  Albuterol, 0.5mg  atrovent, and 380mL NS PTA. Pt states he hurts all over.  States he has been taking diclofinac for pain w/o relief. He is not currently taking antibiotics as his mother states he was not discharged home on any.   Past Medical History  Diagnosis Date  . GERD (gastroesophageal reflux disease)   . Asthma   . Hx MRSA infection     on face  . Gastroparesis   . Diabetic neuropathy   . Seizures   . Stroke     spinal stroke in 4/15  . Diabetes mellitus     sees Dr. Loanne Drilling   . Family history of anesthesia complication     Pt mother can't have epidural procedures  . Dysrhythmia   . Pneumonia   .  Arthritis   . Fibromyalgia    Past Surgical History  Procedure Laterality Date  . Tonsillectomy    . Multiple extractions with alveoloplasty N/A 08/03/2014    Procedure: MULTIPLE EXTRACTIONS;  Surgeon: Gae Bon, DDS;  Location: [facility];  Service: Oral Surgery;  Laterality: N/A;  . Tee without cardioversion N/A 08/17/2014    Procedure: TRANSESOPHAGEAL ECHOCARDIOGRAM (TEE);  Surgeon: Dorothy Spark, MD;  Location: [facility] ENDOSCOPY;  Service: Cardiovascular;  Laterality: N/A;   Family History  Problem Relation Age of Onset  . Diabetes Father   . Hypertension Father   . Asthma      fhx  . Hypertension      fhx  . Stroke      fhx  . Heart disease Mother    History  Substance Use Topics  . Smoking status: Current Some Day Smoker    Types: Cigars, Cigarettes    Last Attempt to Quit: 01/29/2014  . Smokeless tobacco: Never Used     Comment: e-cigarette and cigars currently  . Alcohol Use: No     Comment: rarely    Review of Systems  Constitutional: Positive for fever, chills, appetite change and fatigue.  Respiratory: Positive for shortness of breath. Negative for cough.   Cardiovascular: Negative for chest pain and palpitations.  Gastrointestinal: Negative for nausea, vomiting and abdominal pain.  Musculoskeletal: Positive for myalgias and arthralgias.  Neurological: Positive for weakness ( generalized).  All other systems reviewed and are negative.  Allergies  Cefuroxime axetil; Penicillins; Tessalon; and Shellfish allergy  Home Medications   Prior to Admission medications   Medication Sig Start Date End Date Taking? Authorizing Provider  albuterol (PROVENTIL) (2.5 MG/3ML) 0.083% nebulizer solution Take 3 mLs (2.5 mg total) by nebulization every 4 (four) hours as needed for wheezing or shortness of breath. 04/27/14  Yes [name], MD  ALPRAZolam Duanne Moron) 1 MG tablet Take 1 tablet (1 mg total) by mouth every 6 (six) hours as needed for anxiety. 08/27/14  Yes  [name], MD  amLODipine (NORVASC) 2.5 MG tablet Take 1 tablet (2.5 mg total) by mouth daily. 07/30/14  Yes Shanker Kristeen Mans, MD  atorvastatin (LIPITOR) 20 MG tablet Take 20 mg by mouth daily. 07/20/14  Yes Historical Provider, MD  baclofen (LIORESAL) 10 MG tablet Take 10-20 mg by mouth 3 (three) times daily. Takes 10mg  in morning and lunchtime and takes 20mg  at bedtime   Yes Historical Provider, MD  collagenase (SANTYL) ointment Apply 1 application topically daily. 08/30/14  Yes [name], MD  dexlansoprazole (DEXILANT) 60 MG capsule Take 60 mg by mouth daily.   Yes Historical Provider, MD  diclofenac sodium (VOLTAREN) 1 % GEL Apply 2 g topically daily as needed (for pain).   Yes Historical Provider, MD  diphenoxylate-atropine (LOMOTIL) 2.5-0.025 MG per tablet Take 1 tablet by mouth 2 (two) times daily. Patient taking differently: Take 1 tablet by mouth 2 (two) times daily as needed for diarrhea or loose stools.  07/21/14  Yes Willia Craze, NP  feeding supplement, GLUCERNA SHAKE, (GLUCERNA SHAKE) LIQD Take 237 mLs by mouth 3 (three) times daily between meals. 08/24/14  Yes Orson Eva, MD  fluconazole (DIFLUCAN) 200 MG tablet Take 200 mg by mouth daily. Started 12.20.15 for 30 days 09/15/14  Yes Historical Provider, MD  furosemide (LASIX) 40 MG tablet Take 0.5 tablets (20 mg total) by mouth daily as needed for fluid. 07/30/14  Yes Shanker Kristeen Mans, MD  glucagon (GLUCAGON EMERGENCY) 1 MG injection Inject 1 mg into the vein once as needed. 04/20/14  Yes [name], MD  insulin aspart (NOVOLOG) 100 UNIT/ML injection Inject 5 Units into the skin 3 (three) times daily with meals. 07/21/14  Yes Renato Shin, MD  insulin glargine (LANTUS) 100 UNIT/ML injection Inject 0.25 mLs (25 Units total) into the skin daily. 09/24/14  Yes Donita Brooks, NP  loratadine (CLARITIN) 10 MG tablet Take 10 mg by mouth daily.    Yes Historical Provider, MD  methocarbamol (ROBAXIN) 750 MG tablet Take 750 mg by  mouth 4 (four) times daily as needed for muscle spasms.  07/07/14  Yes Historical Provider, MD  metoCLOPramide (REGLAN) 5 MG tablet Take 1 tablet (5 mg total) by mouth 2 (two) times a week. And as needed for nausea 07/01/14  Yes Meredith Staggers, MD  ondansetron [facility]) 8 MG tablet Take 8 mg by mouth 2 (two) times daily as needed for nausea or vomiting.  07/07/14  Yes Historical Provider, MD  oxyCODONE (ROXICODONE) 15 MG immediate release tablet Take 0.5 tablets (7.5 mg total) by mouth every 6 (six) hours as needed for pain. 09/24/14  Yes Donita Brooks, NP  polyethylene glycol powder (GLYCOLAX/MIRALAX) powder Take 17 g by mouth daily. 09/15/14  Yes [name], MD  pregabalin (LYRICA) 100 MG capsule Take 100-200 mg by mouth 4 (four) times daily. Take 100 mg by mouth in the morning, take 100 mg by mouth in the afternoon, take 100  mg by mouth in the evening and take 200 mg by mouth at bedtime.   Yes Historical Provider, MD  Amino Acids-Protein Hydrolys (FEEDING SUPPLEMENT, PRO-STAT SUGAR FREE 64,) LIQD Take 30 mLs by mouth daily. Patient not taking: Reported on 09/29/2014 08/24/14   Orson Eva, MD  bisacodyl (DULCOLAX) 5 MG EC tablet Take 1 tablet (5 mg total) by mouth 2 (two) times daily as needed for moderate constipation. Patient not taking: Reported on 09/29/2014 09/15/14   [name], MD  ciprofloxacin (CIPRO) 500 MG tablet Take 1 tablet (500 mg total) by mouth 2 (two) times daily. Patient not taking: Reported on 09/29/2014 09/24/14 09/29/14  Donita Brooks, NP  glucose blood (ACCU-CHEK AVIVA PLUS) test strip Use to check blood sugar 4 times per day 07/21/14   Renato Shin, MD  mupirocin cream (BACTROBAN) 2 % Apply topically daily. Apply Bactroban to left toes/foot wounds Q day, then cover with foam dressing.  Change foam dressing Q 5 days or PRN soiling. 09/24/14   Donita Brooks, NP  mupirocin cream (BACTROBAN) 2 % Apply 1 application topically 2 (two) times daily. 09/28/14   [name], MD    BP 106/62 mmHg  Pulse 90  Temp(Src) 98.9 F (37.2 C) (Oral)  Resp 10  SpO2 100% Physical Exam  Constitutional: He appears distressed.  Thin male lying in exam bed, appears acutely ill. Non-rebreather in place, diffuse body twitching  HENT:  Head: Normocephalic and atraumatic.  Mouth/Throat: Mucous membranes are dry.  Eyes: Conjunctivae are normal. No scleral icterus.  Neck: Normal range of motion.  Cardiovascular: Regular rhythm and normal heart sounds.  Tachycardia present.   Pulmonary/Chest: Effort normal. No respiratory distress. He has wheezes. He has rales. He exhibits no tenderness.  Abdominal: Soft. Bowel sounds are normal. He exhibits no distension and no mass. There is no tenderness. There is no rebound and no guarding.  Musculoskeletal: Normal range of motion.  Neurological: He is alert.  Diffuse body weakness, pt is paraplegic at baseline. Alert and oriented to person place and time. Able to complete full sentences w/o difficulty.  Skin: Skin is warm and dry. He is not diaphoretic.  Stage IV pressure ulcer on right buttock near perineum 8x5cm. Stage II pressure ulcer over sacrum near gluteal cleft, appears to be healing well.    Nursing note and vitals reviewed.   ED Course  Procedures (including critical care time) Labs Review Labs Reviewed  CBC WITH DIFFERENTIAL - Abnormal; Notable for the following:    WBC 27.2 (*)    RBC 3.54 (*)    Hemoglobin 9.6 (*)    HCT 29.7 (*)    RDW 16.3 (*)    Platelets 477 (*)    Neutro Abs 20.1 (*)    Lymphs Abs 4.9 (*)    Monocytes Absolute 2.2 (*)    All other components within normal limits  COMPREHENSIVE METABOLIC PANEL - Abnormal; Notable for the following:    Sodium 131 (*)    CO2 17 (*)    Glucose, Bld 338 (*)    BUN 39 (*)    Creatinine, Ser 1.47 (*)    Albumin 2.5 (*)    GFR calc non Af Amer 62 (*)    GFR calc Af Amer 72 (*)    All other components within normal limits  CBG MONITORING, ED - Abnormal; Notable for  the following:    Glucose-Capillary 432 (*)    All other components within normal limits  CULTURE, BLOOD (  ROUTINE X 2)  CULTURE, BLOOD (ROUTINE X 2)  URINE CULTURE  URINALYSIS, ROUTINE W REFLEX MICROSCOPIC  I-STAT CG4 LACTIC ACID, ED  I-STAT CG4 LACTIC ACID, ED  I-STAT CG4 LACTIC ACID, ED  I-STAT CG4 LACTIC ACID, ED    Imaging Review Dg Chest Port 1 View  09/29/2014   CLINICAL DATA:  Shortness of Breath  EXAM: PORTABLE CHEST - 1 VIEW  COMPARISON:  September 24, 2014  FINDINGS: There is persistent left lower lobe consolidation. Right lung is now clear. Heart size and pulmonary vascularity are normal. No adenopathy. No bone lesions.  IMPRESSION: Persistent left lower lobe consolidation. Right lung clear. Lungs elsewhere clear. No change in cardiac silhouette.   Electronically Signed   By: Lowella Grip M.D.   On: 09/29/2014 12:44     EKG Interpretation None      MDM   Final diagnoses:  Sepsis due to pneumonia    Pt is a 28yo male was admitted on 09/16/14 discharged home 12/18.15. Pt was septic at the time, tx for HCAP, scaral osteomyeltitis and urosepsis.  Today, pt presenting to ED, septic, Temp 103, tachycardic, course breath sounds.    Sepsis protocol order set used. IV fluids started, will start pt on Azactam and vancomycin for HCAP and suspected UTI.   CXR: persistent LLL consolidation, Right lung is clear.   CBC: leukocytosis with WBC of 27.2, CMP: elevated Cr to 1.47 Lactic acid: WNL Blood cultures and urine pending. UA pending.   Discussed pt with Dr. Tamera Punt, will admit pt for sepsis from HCAP and likely urosepsis.  Vitals have responded well to IV fluids, HR 111 down from 125 and BP up from initial 86/68 to 115/69 for at least 1 hour.  2:03 PM Consulted with Dr. Wendee Beavers, [facility] who also reviewed pt's medical records, agreed pt is stable for admission to step-down unit.  Temporary admission orders placed.   4:09 PM CBG 432, pt still in ED, bed assignment  still pending. Glucostabilizer order set for insulin placed.    Noland Fordyce, PA-C 09/29/14 [place], MD 09/29/14 [phone]

## 2014-09-29 NOTE — ED Notes (Signed)
EMS reports pt experiencing SOB, RR 6 upon their arrival.  Pt received 10mg  albuterol, RR came up to 12.  Pt mother reports febrile x3 days, calling out in pain.  Pt recently discharged for osteomyelitis r/t pressure ulcers, not currently on antibiotic.  Foley in placed, urine appears opaque and creamy.

## 2014-09-29 NOTE — Consult Note (Signed)
[facility]ious [facility]     Reason for Consult: SIRS    Referring Physician: Dr. Wendee Beavers  Active Problems:   GERD   Protein-calorie malnutrition, severe   IDDM (insulin dependent diabetes mellitus)   Spinal cord infarction (history of)   Sepsis   UTI (lower urinary tract infection)      Recommendations: Continue with current antibiotics pending cultures   Assessment: He has recently been treated for MSSA bacteremia, has a decubitus ulcer, foot ulcers and concerns for active infection with increased WBC, fever.  No particular symptoms including no sob, no cough or productive sputum, no urinary symptoms, no pus in ulcers or surrounding erythema.    Mother asks about getting 'oximetry' test while here since he desats at home while asleep.   Antibiotics: Vancomycin and aztreonam  HPI: Jason [name] is a 28 y.o. male with spinal infarct in April 2015, poorly controlled diabetes, history of spinal cord infarct and paraplegia and recently with MSSA bacteremia and at SNF for vancomycin, now two weeks off, comes in with somnolence, increased WBC.  He has essentially declined since leaving SNF.  + fever and chills. Was recently treated empirically for yeast in urine by PCP.  Most of the history obtained from mother, though he also answered questions.    Review of Systems: A comprehensive review of systems was negative.  Past Medical History  Diagnosis Date  . GERD (gastroesophageal reflux disease)   . Asthma   . Hx MRSA infection     on face  . Gastroparesis   . Diabetic neuropathy   . Seizures   . Stroke     spinal stroke in 4/15  . Diabetes mellitus     sees Dr. Loanne Drilling   . Family history of anesthesia complication     Pt mother can't have epidural procedures  . Dysrhythmia   . Pneumonia   . Arthritis   . Fibromyalgia     History  Substance Use Topics  . Smoking status: Current Some Day Smoker    Types: Cigars, Cigarettes    Last Attempt to  Quit: 01/29/2014  . Smokeless tobacco: Never Used     Comment: e-cigarette and cigars currently  . Alcohol Use: No     Comment: rarely    Family History  Problem Relation Age of Onset  . Diabetes Father   . Hypertension Father   . Asthma      fhx  . Hypertension      fhx  . Stroke      fhx  . Heart disease Mother    Allergies  Allergen Reactions  . Cefuroxime Axetil Anaphylaxis  . Penicillins Anaphylaxis and Other (See Comments)    ?can take amoxicillin?  [name] [Benzonatate] Anaphylaxis  . Shellfish Allergy Itching and Other (See Comments)    Took benadryl to alleviate reaction    OBJECTIVE: Blood pressure 111/66, pulse 93, temperature 98.9 F (37.2 C), temperature source Oral, resp. rate 15, SpO2 100 %. General: awake, alert Skin: area of blister on back, arm, torso Lungs: CTA B Cor: RRR Abdomen: soft, nt, nd Ext: thin, legs in boots  Microbiology: Recent Results (from the past 240 hour(s))  Clostridium Difficile by PCR     Status: None   Collection Time: 09/21/14 10:10 PM  Result Value Ref Range Status   C difficile by pcr NEGATIVE NEGATIVE Final    Scharlene Gloss, [facility] [email or web address] [phone] pager  [phone] 09/29/2014, 4:29 PM

## 2014-09-29 NOTE — ED Notes (Signed)
Belfi, MD notified that 2nd set of blood cultures was not obtained, verbal order to admin abx with only one set collected

## 2014-09-29 NOTE — ED Notes (Signed)
Family at bedside. 

## 2014-09-29 NOTE — ED Notes (Signed)
Vancomycin not given with Azactam d/t IV compatibility message to use with caution per [employer]

## 2014-09-29 NOTE — ED Notes (Signed)
Pt had mod amt of thick brown, pus like drainage from penis area when removing urinary cath, area cleaned thoroughly with soap & water, sterile technique maintained for new urinary cath insertion

## 2014-09-30 DIAGNOSIS — R7881 Bacteremia: Secondary | ICD-10-CM

## 2014-09-30 DIAGNOSIS — B9562 Methicillin resistant Staphylococcus aureus infection as the cause of diseases classified elsewhere: Secondary | ICD-10-CM

## 2014-09-30 DIAGNOSIS — N179 Acute kidney failure, unspecified: Secondary | ICD-10-CM

## 2014-09-30 LAB — BASIC METABOLIC PANEL
ANION GAP: 7 (ref 5–15)
BUN: 24 mg/dL — ABNORMAL HIGH (ref 6–23)
CALCIUM: 9.6 mg/dL (ref 8.4–10.5)
CHLORIDE: 110 meq/L (ref 96–112)
CO2: 20 mmol/L (ref 19–32)
Creatinine, Ser: 0.8 mg/dL (ref 0.50–1.35)
GFR calc non Af Amer: >90 mL/min (ref >90)
Glucose, Bld: 132 mg/dL — ABNORMAL HIGH (ref 70–99)
Potassium: 4 mmol/L (ref 3.5–5.1)
SODIUM: 137 mmol/L (ref 135–145)

## 2014-09-30 LAB — CBC
HCT: 29.2 % — ABNORMAL LOW (ref 39.0–52.0)
Hemoglobin: 9.5 g/dL — ABNORMAL LOW (ref 13.0–17.0)
MCH: 26.8 pg (ref 26.0–34.0)
MCHC: 32.5 g/dL (ref 30.0–36.0)
MCV: 82.5 fL (ref 78.0–100.0)
PLATELETS: 496 10*3/uL — AB (ref 150–400)
RBC: 3.54 MIL/uL — ABNORMAL LOW (ref 4.22–5.81)
RDW: 16 % — AB (ref 11.5–15.5)
WBC: 25.6 10*3/uL — AB (ref 4.0–10.5)

## 2014-09-30 LAB — URINE CULTURE
COLONY COUNT: NO GROWTH
Culture: NO GROWTH

## 2014-09-30 LAB — GLUCOSE, CAPILLARY
GLUCOSE-CAPILLARY: 141 mg/dL — AB (ref 70–99)
GLUCOSE-CAPILLARY: 168 mg/dL — AB (ref 70–99)
Glucose-Capillary: 112 mg/dL — ABNORMAL HIGH (ref 70–99)
Glucose-Capillary: 119 mg/dL — ABNORMAL HIGH (ref 70–99)
Glucose-Capillary: 125 mg/dL — ABNORMAL HIGH (ref 70–99)
Glucose-Capillary: 214 mg/dL — ABNORMAL HIGH (ref 70–99)
Glucose-Capillary: 243 mg/dL — ABNORMAL HIGH (ref 70–99)

## 2014-09-30 MED ORDER — INSULIN ASPART 100 UNIT/ML ~~LOC~~ SOLN
0.0000 [IU] | Freq: Every day | SUBCUTANEOUS | Status: DC
  Administered 2014-09-30 – 2014-10-03 (×2): 2 [IU] via SUBCUTANEOUS
  Administered 2014-10-04: 3 [IU] via SUBCUTANEOUS

## 2014-09-30 MED ORDER — MORPHINE SULFATE 2 MG/ML IJ SOLN
1.0000 mg | INTRAMUSCULAR | Status: DC | PRN
  Administered 2014-09-30 – 2014-10-05 (×15): 1 mg via INTRAVENOUS
  Filled 2014-09-30 (×15): qty 1

## 2014-09-30 MED ORDER — SODIUM CHLORIDE 0.9 % IV SOLN
INTRAVENOUS | Status: DC
  Administered 2014-09-30: 15:00:00 via INTRAVENOUS

## 2014-09-30 MED ORDER — CETYLPYRIDINIUM CHLORIDE 0.05 % MT LIQD
7.0000 mL | Freq: Two times a day (BID) | OROMUCOSAL | Status: DC
  Administered 2014-09-30 – 2014-10-14 (×26): 7 mL via OROMUCOSAL

## 2014-09-30 MED ORDER — BACITRACIN ZINC 500 UNIT/GM EX OINT
TOPICAL_OINTMENT | Freq: Two times a day (BID) | CUTANEOUS | Status: DC
  Administered 2014-09-30 (×2): 1 via TOPICAL
  Administered 2014-10-01 – 2014-10-06 (×9): via TOPICAL
  Administered 2014-10-07: 1 via TOPICAL
  Administered 2014-10-07 – 2014-10-14 (×11): via TOPICAL
  Filled 2014-09-30 (×2): qty 28.35

## 2014-09-30 MED ORDER — MUPIROCIN CALCIUM 2 % EX CREA
TOPICAL_CREAM | Freq: Two times a day (BID) | CUTANEOUS | Status: DC
  Administered 2014-09-30 – 2014-10-01 (×3): 1 via TOPICAL
  Administered 2014-10-02 – 2014-10-03 (×3): via TOPICAL
  Administered 2014-10-03: 1 via TOPICAL
  Administered 2014-10-05 – 2014-10-06 (×3): via TOPICAL
  Administered 2014-10-07: 1 via TOPICAL
  Administered 2014-10-07 – 2014-10-14 (×12): via TOPICAL
  Filled 2014-09-30 (×3): qty 15

## 2014-09-30 MED ORDER — COLLAGENASE 250 UNIT/GM EX OINT
TOPICAL_OINTMENT | Freq: Every day | CUTANEOUS | Status: DC
  Administered 2014-09-30: 1 via TOPICAL
  Administered 2014-10-01 – 2014-10-02 (×2): via TOPICAL
  Administered 2014-10-03: 1 via TOPICAL
  Administered 2014-10-05 – 2014-10-09 (×3): via TOPICAL
  Administered 2014-10-10: 1 via TOPICAL
  Administered 2014-10-11 – 2014-10-14 (×3): via TOPICAL
  Filled 2014-09-30 (×2): qty 30

## 2014-09-30 MED ORDER — VANCOMYCIN HCL IN DEXTROSE 750-5 MG/150ML-% IV SOLN
750.0000 mg | Freq: Two times a day (BID) | INTRAVENOUS | Status: DC
  Administered 2014-09-30 – 2014-10-07 (×15): 750 mg via INTRAVENOUS
  Filled 2014-09-30 (×17): qty 150

## 2014-09-30 MED ORDER — INSULIN ASPART 100 UNIT/ML ~~LOC~~ SOLN
0.0000 [IU] | Freq: Three times a day (TID) | SUBCUTANEOUS | Status: DC
  Administered 2014-09-30 – 2014-10-01 (×2): 2 [IU] via SUBCUTANEOUS
  Administered 2014-10-01 – 2014-10-02 (×3): 1 [IU] via SUBCUTANEOUS
  Administered 2014-10-02: 2 [IU] via SUBCUTANEOUS
  Administered 2014-10-03 (×2): 3 [IU] via SUBCUTANEOUS
  Administered 2014-10-03: 5 [IU] via SUBCUTANEOUS
  Administered 2014-10-04: 2 [IU] via SUBCUTANEOUS
  Administered 2014-10-05 (×2): 5 [IU] via SUBCUTANEOUS

## 2014-09-30 NOTE — Progress Notes (Signed)
PROGRESS NOTE    Jason [name] [id number] DOB: 01/08/1986 DOA: 09/29/2014 PCP: [name], MD  HPI/Brief narrative 28 year old male patient with history of spinal infarct in April 2015, quadriparesis/paraplegia, poorly controlled type II DM, recently treated for MSSA bacteremia at SNF for vancomycin, now 2 weeks off, chronic indwelling Foley catheter, not on home oxygen, presented to ED with altered mental status/somnolence, leukocytosis, fever and chills. Infectious disease consulting.   Assessment/Plan:  1. Sepsis/fever: Exact source not known but possibly UTI given history of intermittent purulent drainage from the penis. Foley catheter changed since admission. Blood cultures 2 negative to date. Persistent left lower lobe consolidation on chest x-ray but no respiratory symptoms. Recently treated for MSSA bacteremia. Recent TEE was negative. Patient has a decubitus ulcer and will get wound care to consult. Infectious disease follow-up appreciated. Continue IV vancomycin and aztreonam for now.  2. Possible UTI: Continue IV aztreonam pending culture results. 3. Acute encephalopathy: Secondary to problem #1. Resolved. No focal deficits. 4. Acute kidney injury: Related to sepsis and dehydration. Resolved. 5. Type II DM/IDDM: Continue Lantus and SSI and adjust as needed. 6. History of severe protein calorie malnutrition: Continue nutritional supplements. 7. Sacral decubitus ulcer: Wound care consultation. 8. History of GERD: Continue PPI. 9. History of spinal cord infarction and quadriparesis: Paraplegic in lower extremities and weak in the upper extremities. 10. Dehydration with hyponatremia: Improved. Continue IV normal saline for additional 24 hours. 11. Leukocytosis: Secondary to problem #1. Follow CBCs. 12. History of recently treated MSSA bacteremia: Continue above antibiotics and follow blood culture results. 13. Hypertension: Soft blood pressures. Hold  antihypertensives. 14. Chronic benzodiazepine use: Continue same. 15. Chronic Foley catheter and traumatic hypospadias: Patient states that urology had consulted in the past and recommended suprapubic catheter but he was not medically stable to get it at that time. Will discuss with urology.   Code Status:  Full Family Communication:  None at bedside. Disposition Plan:  Home when medically stable   Consultants:   Infectious disease  Procedures:   Changed Foley catheter  Antibiotics:   IV vancomycin and 12/23 >  IV aztreonam 12/23 >   Subjective:  Feels much better. Minimal occasional dry cough but no dyspnea. Denies pain issues.  Objective: Filed Vitals:   09/30/14 0800 09/30/14 0900 09/30/14 1000 09/30/14 1129  BP: 95/55 114/54 99/64 93/51   Pulse: 86 86 84 85  Temp:    98.8 F (37.1 C)  TempSrc:    Oral  Resp: 14 26 15 16   Height:      Weight:      SpO2: 100% 100% 100% 100%    Intake/Output Summary (Last 24 hours) at 09/30/14 1213 Last data filed at 09/30/14 1022  Gross per 24 hour  Intake   3210 ml  Output   2900 ml  Net    310 ml   Filed Weights   09/30/14 0428  Weight: 53.8 kg (118 lb 9.7 oz)     Exam:  General exam:  Moderately built and thinly nourished chronically ill-looking young male lying comfortably in bed Respiratory system: Clear. No increased work of breathing. Cardiovascular system: S1 & S2 heard, RRR. No JVD, murmurs, gallops, clicks or pedal edema. telemetry: Sinus rhythm. Gastrointestinal system: Abdomen is nondistended, soft and nontender. Normal bowel sounds heard. Central nervous system: Alert and oriented. No focal neurological deficits. Extremities:  Grade 0 x 5 power in lower extremities and grade 3 x 5 power in upper extremities proximally and 0 x 5  power distally with contractures of fingers.   Data Reviewed: Basic Metabolic Panel:  Recent Labs Lab 09/24/14 0320 09/29/14 1221 09/29/14 1916 09/30/14 0411  NA 131* 131*   --  137  K 4.2 4.6  --  4.0  CL 96 104  --  110  CO2 27 17*  --  20  GLUCOSE 86 338*  --  132*  BUN 15 39*  --  24*  CREATININE 0.72 1.47*  --  0.80  CALCIUM 8.9 9.2  --  9.6  MG 1.7  --  1.5  --   PHOS  --   --  3.2  --    Liver Function Tests:  Recent Labs Lab 09/29/14 1221  AST 11  ALT 11  ALKPHOS 76  BILITOT 0.5  PROT 7.7  ALBUMIN 2.5*   No results for input(s): LIPASE, AMYLASE in the last 168 hours. No results for input(s): AMMONIA in the last 168 hours. CBC:  Recent Labs Lab 09/24/14 0320 09/29/14 1221 09/30/14 0411  WBC 20.2* 27.2* 25.6*  NEUTROABS  --  20.1*  --   HGB 9.7* 9.6* 9.5*  HCT 30.0* 29.7* 29.2*  MCV 81.1 83.9 82.5  PLT 376 477* 496*   Cardiac Enzymes: No results for input(s): CKTOTAL, CKMB, CKMBINDEX, TROPONINI in the last 168 hours. BNP (last 3 results)  Recent Labs  10/02/13 0445 01/29/14 2113  PROBNP 2451.0* 1343.0*   CBG:  Recent Labs Lab 09/30/14 0044 09/30/14 0207 09/30/14 0425 09/30/14 0752 09/30/14 1129  GLUCAP 119* 112* 125* 141* 214*    Recent Results (from the past 240 hour(s))  Clostridium Difficile by PCR     Status: None   Collection Time: 09/21/14 10:10 PM  Result Value Ref Range Status   C difficile by pcr NEGATIVE NEGATIVE Final  Blood Culture (routine x 2)     Status: None (Preliminary result)   Collection Time: 09/29/14 12:16 PM  Result Value Ref Range Status   Specimen Description BLOOD LEFT HAND  Final   Special Requests BOTTLES DRAWN AEROBIC AND ANAEROBIC 5CC  Final   Culture  Setup Time   Final    09/29/2014 18:08 Performed at [facility]    Culture   Final           BLOOD CULTURE RECEIVED NO GROWTH TO DATE CULTURE WILL BE HELD FOR 5 DAYS BEFORE ISSUING A FINAL NEGATIVE REPORT Performed at [facility]    Report Status PENDING  Incomplete  Blood Culture (routine x 2)     Status: None (Preliminary result)   Collection Time: 09/29/14  1:20 PM  Result Value Ref Range Status    Specimen Description BLOOD RIGHT ANTECUBITAL  Final   Special Requests BOTTLES DRAWN AEROBIC AND ANAEROBIC 5ML  Final   Culture  Setup Time   Final    09/29/2014 18:07 Performed at [facility]    Culture   Final           BLOOD CULTURE RECEIVED NO GROWTH TO DATE CULTURE WILL BE HELD FOR 5 DAYS BEFORE ISSUING A FINAL NEGATIVE REPORT Performed at [facility]    Report Status PENDING  Incomplete         Studies: Dg Chest Port 1 View  09/29/2014   CLINICAL DATA:  Shortness of Breath  EXAM: PORTABLE CHEST - 1 VIEW  COMPARISON:  September 24, 2014  FINDINGS: There is persistent left lower lobe consolidation. Right lung is now clear. Heart size and pulmonary  vascularity are normal. No adenopathy. No bone lesions.  IMPRESSION: Persistent left lower lobe consolidation. Right lung clear. Lungs elsewhere clear. No change in cardiac silhouette.   Electronically Signed   By: Lowella Grip M.D.   On: 09/29/2014 12:44        Scheduled Meds: . antiseptic oral rinse  7 mL Mouth Rinse BID  . aztreonam  1 g Intravenous 3 times per day  . feeding supplement (GLUCERNA SHAKE)  237 mL Oral TID BM  . heparin  5,000 Units Subcutaneous 3 times per day  . insulin aspart  0-15 Units Subcutaneous TID WC  . insulin glargine  25 Units Subcutaneous QHS  . metoCLOPramide  5 mg Oral Once per day on Mon Thu  . pantoprazole  40 mg Oral Daily  . pregabalin  100 mg Oral QID  . sodium chloride  3 mL Intravenous Q12H  . vancomycin  750 mg Intravenous Q12H   Continuous Infusions: . sodium chloride 125 mL/hr at 09/30/14 5597    Active Problems:   GERD   Protein-calorie malnutrition, severe   IDDM (insulin dependent diabetes mellitus)   Spinal cord infarction (history of)   Sepsis   UTI (lower urinary tract infection)    Time spent: 45 minutes.    Vernell Leep, MD, FACP, FHM. [facility] Pager [phone]  If 7PM-7AM, please contact  night-coverage [email or web address] Password [email or web address] 09/30/2014, 12:13 PM    LOS: 1 day

## 2014-09-30 NOTE — Progress Notes (Addendum)
[facility] for Infectious Disease  Date of Admission:  09/29/2014  Antibiotics: Vanco/aztreonam day 2  Subjective: Feels ok, drinking, eating  Objective: Temp:  [97.5 F (36.4 C)-103.2 F (39.6 C)] 98.6 F (37 C) (12/24 0752) Pulse Rate:  [81-125] 86 (12/24 0900) Resp:  [9-26] 26 (12/24 0900) BP: (86-123)/(48-73) 114/54 mmHg (12/24 0900) SpO2:  [97 %-100 %] 100 % (12/24 0900) Weight:  [118 lb 9.7 oz (53.8 kg)] 118 lb 9.7 oz (53.8 kg) (12/24 0428)  General: awake, slowed but nad Skin: no rashes, small blister on torso Lungs: CTA B, anterior exam Cor: RRR Abdomen: soft, nt, nd Ext: contracted, no edema  Lab Results Lab Results  Component Value Date   WBC 25.6* 09/30/2014   HGB 9.5* 09/30/2014   HCT 29.2* 09/30/2014   MCV 82.5 09/30/2014   PLT 496* 09/30/2014    Lab Results  Component Value Date   CREATININE 0.80 09/30/2014   BUN 24* 09/30/2014   NA 137 09/30/2014   K 4.0 09/30/2014   CL 110 09/30/2014   CO2 20 09/30/2014    Lab Results  Component Value Date   ALT 11 09/29/2014   AST 11 09/29/2014   ALKPHOS 76 09/29/2014   BILITOT 0.5 09/29/2014      Microbiology: Recent Results (from the past 240 hour(s))  Clostridium Difficile by PCR     Status: None   Collection Time: 09/21/14 10:10 PM  Result Value Ref Range Status   C difficile by pcr NEGATIVE NEGATIVE Final  Blood Culture (routine x 2)     Status: None (Preliminary result)   Collection Time: 09/29/14 12:16 PM  Result Value Ref Range Status   Specimen Description BLOOD LEFT HAND  Final   Special Requests BOTTLES DRAWN AEROBIC AND ANAEROBIC 5CC  Final   Culture  Setup Time   Final    09/29/2014 18:08 Performed at [facility]    Culture   Final           BLOOD CULTURE RECEIVED NO GROWTH TO DATE CULTURE WILL BE HELD FOR 5 DAYS BEFORE ISSUING A FINAL NEGATIVE REPORT Performed at [facility]    Report Status PENDING  Incomplete  Blood Culture (routine x 2)     Status:  None (Preliminary result)   Collection Time: 09/29/14  1:20 PM  Result Value Ref Range Status   Specimen Description BLOOD RIGHT ANTECUBITAL  Final   Special Requests BOTTLES DRAWN AEROBIC AND ANAEROBIC 5ML  Final   Culture  Setup Time   Final    09/29/2014 18:07 Performed at [facility]    Culture   Final           BLOOD CULTURE RECEIVED NO GROWTH TO DATE CULTURE WILL BE HELD FOR 5 DAYS BEFORE ISSUING A FINAL NEGATIVE REPORT Performed at [facility]    Report Status PENDING  Incomplete    Studies/Results: Dg Chest Port 1 View  09/29/2014   CLINICAL DATA:  Shortness of Breath  EXAM: PORTABLE CHEST - 1 VIEW  COMPARISON:  September 24, 2014  FINDINGS: There is persistent left lower lobe consolidation. Right lung is now clear. Heart size and pulmonary vascularity are normal. No adenopathy. No bone lesions.  IMPRESSION: Persistent left lower lobe consolidation. Right lung clear. Lungs elsewhere clear. No change in cardiac silhouette.   Electronically Signed   By: Lowella Grip M.D.   On: 09/29/2014 12:44    Assessment/Plan:  1) fever - no obvious source  at this time.  I don't think it is pneumonia with no respiratory symptoms, he did get prolonged treatment for his MRSA bacteremia even if not the full duration indicated, his TEE was negative and he did get about 4 weeks.  Urine may be the source.  Decubitus ulcer.  His UA shows WBCs, though noted pus-like discharge at end, so may be local.  New catheter placed.   ?suprapubic  WBC improving, now afebrile since admission -continue with vancomycin and aztreonam for now  Dr. Tommy Medal to follow up tomorrow  Scharlene Gloss, [place] [facility] [place] [email or web address] [phone] pager   [phone] cell 09/30/2014, 10:29 AM

## 2014-09-30 NOTE — Progress Notes (Signed)
ANTIBIOTIC CONSULT NOTE - Follow-up  Pharmacy Consult for vancomycin + aztreonam Indication: rule out pneumonia  Allergies  Allergen Reactions  . Cefuroxime Axetil Anaphylaxis  . Penicillins Anaphylaxis and Other (See Comments)    ?can take amoxicillin?  [name] [Benzonatate] Anaphylaxis  . Shellfish Allergy Itching and Other (See Comments)    Took benadryl to alleviate reaction    Patient Measurements: Height: 5\' 8"  (172.7 cm) Weight: 118 lb 9.7 oz (53.8 kg) IBW/kg (Calculated) : 68.4 Adjusted Body Weight:   Vital Signs: Temp: 98.6 F (37 C) (12/24 0752) Temp Source: Oral (12/24 0752) BP: 114/64 mmHg (12/24 0752) Pulse Rate: 87 (12/24 0752) Intake/Output from previous day: 12/23 0701 - 12/24 0700 In: 2300 [I.V.:2300] Out: 1200 [Urine:1200] Intake/Output from this shift:    Labs:  Recent Labs  09/29/14 1221 09/30/14 0411  WBC 27.2* 25.6*  HGB 9.6* 9.5*  PLT 477* 496*  CREATININE 1.47* 0.80   Estimated Creatinine Clearance: 102.7 mL/min (by C-G formula based on Cr of 0.8). No results for input(s): VANCOTROUGH, VANCOPEAK, VANCORANDOM, GENTTROUGH, GENTPEAK, GENTRANDOM, TOBRATROUGH, TOBRAPEAK, TOBRARND, AMIKACINPEAK, AMIKACINTROU, AMIKACIN in the last 72 hours.   Microbiology: Recent Results (from the past 720 hour(s))  Blood Culture (routine x 2)     Status: None   Collection Time: 09/16/14  3:25 AM  Result Value Ref Range Status   Specimen Description BLOOD RIGHT ARM  Final   Special Requests BOTTLES DRAWN AEROBIC AND ANAEROBIC 10CC  Final   Culture  Setup Time   Final    09/16/2014 08:58 Performed at [place]   Final    NO GROWTH 5 DAYS Performed at [facility]    Report Status 09/22/2014 FINAL  Final  Blood Culture (routine x 2)     Status: None   Collection Time: 09/16/14  3:30 AM  Result Value Ref Range Status   Specimen Description BLOOD LEFT ARM  Final   Special Requests BOTTLES DRAWN AEROBIC ONLY 10CC  Final    Culture  Setup Time   Final    09/16/2014 08:58 Performed at [place]   Final    NO GROWTH 5 DAYS Performed at [facility]    Report Status 09/22/2014 FINAL  Final  Urine culture     Status: None   Collection Time: 09/16/14  4:09 AM  Result Value Ref Range Status   Specimen Description URINE, RANDOM  Final   Special Requests NONE  Final   Culture  Setup Time   Final    09/16/2014 08:45 Performed at [place]   Final    >=100,000 COLONIES/ML Performed at [place]   Final    KLEBSIELLA SPECIES Performed at [facility]    Report Status 09/18/2014 FINAL  Final   Organism ID, Bacteria KLEBSIELLA SPECIES  Final      Susceptibility   Klebsiella species - MIC*    AMPICILLIN >=32 RESISTANT Resistant     CEFAZOLIN <=4 SENSITIVE Sensitive     CEFTRIAXONE <=1 SENSITIVE Sensitive     CIPROFLOXACIN <=0.25 SENSITIVE Sensitive     GENTAMICIN <=1 SENSITIVE Sensitive     NITROFURANTOIN <=16 SENSITIVE Sensitive     TOBRAMYCIN <=1 SENSITIVE Sensitive     TRIMETH/SULFA <=20 SENSITIVE Sensitive     PIP/TAZO 8 SENSITIVE Sensitive     * KLEBSIELLA SPECIES  MRSA PCR Screening  Status: None   Collection Time: 09/16/14  7:08 AM  Result Value Ref Range Status   MRSA by PCR NEGATIVE NEGATIVE Final    Comment:        The GeneXpert MRSA Assay (FDA approved for NASAL specimens only), is one component of a comprehensive MRSA colonization surveillance program. It is not intended to diagnose MRSA infection nor to guide or monitor treatment for MRSA infections.   Culture, respiratory (tracheal aspirate)     Status: None   Collection Time: 09/17/14  6:19 AM  Result Value Ref Range Status   Specimen Description TRACHEAL ASPIRATE  Final   Special Requests NONE  Final   Gram Stain   Final    FEW WBC PRESENT,BOTH PMN AND MONONUCLEAR RARE SQUAMOUS EPITHELIAL CELLS PRESENT NO ORGANISMS SEEN Performed  at [facility]    Culture   Final    FEW STAPHYLOCOCCUS AUREUS Note: RIFAMPIN AND GENTAMICIN SHOULD NOT BE USED AS SINGLE DRUGS FOR TREATMENT OF STAPH INFECTIONS. Performed at [facility]    Report Status 09/20/2014 FINAL  Final   Organism ID, Bacteria STAPHYLOCOCCUS AUREUS  Final      Susceptibility   Staphylococcus aureus - MIC*    CLINDAMYCIN >=8 RESISTANT Resistant     ERYTHROMYCIN >=8 RESISTANT Resistant     GENTAMICIN <=0.5 SENSITIVE Sensitive     LEVOFLOXACIN 0.25 SENSITIVE Sensitive     OXACILLIN 1 SENSITIVE Sensitive     PENICILLIN >=0.5 RESISTANT Resistant     RIFAMPIN <=0.5 SENSITIVE Sensitive     TRIMETH/SULFA <=10 SENSITIVE Sensitive     VANCOMYCIN 1 SENSITIVE Sensitive     TETRACYCLINE >=16 RESISTANT Resistant     MOXIFLOXACIN <=0.25 SENSITIVE Sensitive     * FEW STAPHYLOCOCCUS AUREUS  Clostridium Difficile by PCR     Status: None   Collection Time: 09/21/14 10:10 PM  Result Value Ref Range Status   C difficile by pcr NEGATIVE NEGATIVE Final    Medications:  Anti-infectives    Start     Dose/Rate Route Frequency Ordered Stop   09/30/14 1400  vancomycin (VANCOCIN) IVPB 1000 mg/200 mL premix  Status:  Discontinued     1,000 mg200 mL/hr over 60 Minutes Intravenous Every 24 hours 09/29/14 1326 09/29/14 1848   09/30/14 1400  vancomycin (VANCOCIN) IVPB 1000 mg/200 mL premix  Status:  Discontinued     1,000 mg200 mL/hr over 60 Minutes Intravenous Every 24 hours 09/29/14 1916 09/30/14 0845   09/30/14 1000  vancomycin (VANCOCIN) IVPB 750 mg/150 ml premix     750 mg150 mL/hr over 60 Minutes Intravenous Every 12 hours 09/30/14 0845     09/29/14 2200  aztreonam (AZACTAM) 1 g in dextrose 5 % 50 mL IVPB  Status:  Discontinued     1 g100 mL/hr over 30 Minutes Intravenous 3 times per day 09/29/14 1326 09/29/14 1848   09/29/14 2200  aztreonam (AZACTAM) 1 g in dextrose 5 % 50 mL IVPB     1 g100 mL/hr over 30 Minutes Intravenous 3 times per day 09/29/14 1916      09/29/14 1300  aztreonam (AZACTAM) 2 g in dextrose 5 % 50 mL IVPB     2 g100 mL/hr over 30 Minutes Intravenous STAT 09/29/14 1239 09/29/14 1355   09/29/14 1245  vancomycin (VANCOCIN) IVPB 1000 mg/200 mL premix     1,000 mg200 mL/hr over 60 Minutes Intravenous  Once 09/29/14 1239 09/29/14 1530     Assessment: 5 yom with hx of paraplegia presented  to the ED with weakness and SOB. He was recently discharged on cipro but broadened therapy to vancomycin + aztreonam (PCN allergy) upon admission. Pt with Tmax of 103.2 and WBC 25.6. Scr dropped to 0.8. Unclear dosing requirements d/t fluctuating renal fxn and multiple different doses in past admission.  Vanc 12/23>> Aztreo 12/23>>  Goal of Therapy:  Vancomycin trough level 15-20 mcg/ml  Plan:  1. Change vancomycin to 750mg  IV Q12H 2. Continue Aztreonam 1gm IV Q8H 3. F/u renal fxn, C&S, clinical status and trough atSS  Adelard Sanon, Rande Lawman 09/30/2014,8:46 AM

## 2014-09-30 NOTE — Consult Note (Addendum)
WOC wound consult note Reason for Consult: consult requested for sacrum and right ischium wounds.  Pt familiar to [facility] team from several previous admissions.  Was followed by [facility] team during last admission for osteomyelitis and they ordered Santyl for chemical debridement. Pt has several wounds and multiple systemic factors that can impair healing; immobile, incontinent, emecated, septic. Wound type: Sacrum/buttocks stage 3; 2 separate areas on each side of buttocks separated by small island of skin; each is 5X2X.2cm, 80% red and moist, 20% yellow, small amt yellow drainage, no odor.  rectum is located close to the site.  Left outer buttock with partial thickness abrasion; 4X1X,1cm, pink and moist. Right ischium stage 4; bone palpable; 4X2X4cm, 90% yellow slough, 10% red. Mod amt tan drainage with some odor. Left foot with several full thickness wounds; appears r/t poor perfusion. 5th toe 1X1X.1cm, 100% red and moist. No odor or drainage. 4th toe extending to anterior foot 4X2X.1cm; 50% dry tightly adhered eschar to toe, 50% red and moist without odor or drainage. Great toe 1X1X1.cm, 100% red and moist. No odor or drainage. Pressure Ulcer POA: Yes Dressing procedure/placement/frequency:  Bactroban to left toes and foot wound to promote moist healing. Float heels to reduce pressure. Foam dressing to sacrum and right buttock to protect from soiling, Santyl to chemically debride nonviable tissue to right ischium and buttock wounds. Air mattress replacement ordered to reduce pressure Nutrition consult to optimize nutrition. Please re-consult if further assistance is needed.  Thank-you,  Julien Girt MSN, [place], [place], [place], [place]

## 2014-10-01 ENCOUNTER — Encounter (HOSPITAL_COMMUNITY): Payer: Self-pay | Admitting: *Deleted

## 2014-10-01 DIAGNOSIS — L02419 Cutaneous abscess of limb, unspecified: Secondary | ICD-10-CM | POA: Insufficient documentation

## 2014-10-01 DIAGNOSIS — K219 Gastro-esophageal reflux disease without esophagitis: Secondary | ICD-10-CM

## 2014-10-01 DIAGNOSIS — E119 Type 2 diabetes mellitus without complications: Secondary | ICD-10-CM

## 2014-10-01 DIAGNOSIS — M4628 Osteomyelitis of vertebra, sacral and sacrococcygeal region: Secondary | ICD-10-CM

## 2014-10-01 DIAGNOSIS — E43 Unspecified severe protein-calorie malnutrition: Secondary | ICD-10-CM

## 2014-10-01 DIAGNOSIS — Z794 Long term (current) use of insulin: Secondary | ICD-10-CM

## 2014-10-01 DIAGNOSIS — A419 Sepsis, unspecified organism: Principal | ICD-10-CM

## 2014-10-01 DIAGNOSIS — D649 Anemia, unspecified: Secondary | ICD-10-CM

## 2014-10-01 DIAGNOSIS — L03119 Cellulitis of unspecified part of limb: Secondary | ICD-10-CM

## 2014-10-01 DIAGNOSIS — N390 Urinary tract infection, site not specified: Secondary | ICD-10-CM

## 2014-10-01 LAB — BASIC METABOLIC PANEL
ANION GAP: 5 (ref 5–15)
BUN: 11 mg/dL (ref 6–23)
CO2: 20 mmol/L (ref 19–32)
Calcium: 9.1 mg/dL (ref 8.4–10.5)
Chloride: 106 mEq/L (ref 96–112)
Creatinine, Ser: 0.65 mg/dL (ref 0.50–1.35)
Glucose, Bld: 221 mg/dL — ABNORMAL HIGH (ref 70–99)
POTASSIUM: 4.2 mmol/L (ref 3.5–5.1)
SODIUM: 131 mmol/L — AB (ref 135–145)

## 2014-10-01 LAB — GLUCOSE, CAPILLARY
Glucose-Capillary: 150 mg/dL — ABNORMAL HIGH (ref 70–99)
Glucose-Capillary: 174 mg/dL — ABNORMAL HIGH (ref 70–99)
Glucose-Capillary: 128 mg/dL — ABNORMAL HIGH (ref 70–99)
Glucose-Capillary: 151 mg/dL — ABNORMAL HIGH (ref 70–99)

## 2014-10-01 LAB — CBC
HEMATOCRIT: 27.7 % — AB (ref 39.0–52.0)
Hemoglobin: 8.9 g/dL — ABNORMAL LOW (ref 13.0–17.0)
MCH: 26.4 pg (ref 26.0–34.0)
MCHC: 32.1 g/dL (ref 30.0–36.0)
MCV: 82.2 fL (ref 78.0–100.0)
PLATELETS: 479 10*3/uL — AB (ref 150–400)
RBC: 3.37 MIL/uL — ABNORMAL LOW (ref 4.22–5.81)
RDW: 15.9 % — AB (ref 11.5–15.5)
WBC: 16.5 10*3/uL — ABNORMAL HIGH (ref 4.0–10.5)

## 2014-10-01 LAB — VANCOMYCIN, TROUGH: VANCOMYCIN TR: 19 ug/mL (ref 10.0–20.0)

## 2014-10-01 MED ORDER — LORATADINE 10 MG PO TABS
10.0000 mg | ORAL_TABLET | Freq: Every day | ORAL | Status: DC
  Administered 2014-10-01 – 2014-10-14 (×12): 10 mg via ORAL
  Filled 2014-10-01 (×15): qty 1

## 2014-10-01 MED ORDER — POLYETHYLENE GLYCOL 3350 17 GM/SCOOP PO POWD
17.0000 g | Freq: Every day | ORAL | Status: DC
  Filled 2014-10-01: qty 255

## 2014-10-01 MED ORDER — POLYETHYLENE GLYCOL 3350 17 G PO PACK
17.0000 g | PACK | Freq: Every day | ORAL | Status: DC
  Administered 2014-10-03 – 2014-10-11 (×4): 17 g via ORAL
  Filled 2014-10-01 (×14): qty 1

## 2014-10-01 MED ORDER — ALPRAZOLAM 0.5 MG PO TABS
1.0000 mg | ORAL_TABLET | Freq: Three times a day (TID) | ORAL | Status: DC | PRN
  Administered 2014-10-03: 1 mg via ORAL
  Filled 2014-10-01: qty 2

## 2014-10-01 MED ORDER — INSULIN ASPART 100 UNIT/ML ~~LOC~~ SOLN
4.0000 [IU] | Freq: Three times a day (TID) | SUBCUTANEOUS | Status: DC
  Administered 2014-10-01 – 2014-10-05 (×11): 4 [IU] via SUBCUTANEOUS

## 2014-10-01 MED ORDER — ATORVASTATIN CALCIUM 20 MG PO TABS
20.0000 mg | ORAL_TABLET | Freq: Every day | ORAL | Status: DC
  Administered 2014-10-01 – 2014-10-14 (×13): 20 mg via ORAL
  Filled 2014-10-01 (×14): qty 1

## 2014-10-01 NOTE — Progress Notes (Signed)
ANTIBIOTIC CONSULT NOTE - FOLLOW UP  Pharmacy Consult for Vancomycin  Indication: rule out pneumonia  Allergies  Allergen Reactions  . Cefuroxime Axetil Anaphylaxis  . Penicillins Anaphylaxis and Other (See Comments)    ?can take amoxicillin?  [name] [Benzonatate] Anaphylaxis  . Shellfish Allergy Itching and Other (See Comments)    Took benadryl to alleviate reaction    Patient Measurements: Height: 5\' 8"  (172.7 cm) Weight: 122 lb 2.2 oz (55.4 kg) IBW/kg (Calculated) : 68.4  Labs:  Recent Labs  09/29/14 1221 09/30/14 0411 10/01/14 0345  WBC 27.2* 25.6* 16.5*  HGB 9.6* 9.5* 8.9*  PLT 477* 496* 479*  CREATININE 1.47* 0.80 0.65   Estimated Creatinine Clearance: 105.8 mL/min (by C-G formula based on Cr of 0.65).  Recent Labs  10/01/14 2135  [place] 19.0     Microbiology: Recent Results (from the past 720 hour(s))  Blood Culture (routine x 2)     Status: None   Collection Time: 09/16/14  3:25 AM  Result Value Ref Range Status   Specimen Description BLOOD RIGHT ARM  Final   Special Requests BOTTLES DRAWN AEROBIC AND ANAEROBIC 10CC  Final   Culture  Setup Time   Final    09/16/2014 08:58 Performed at [place]   Final    NO GROWTH 5 DAYS Performed at [facility]    Report Status 09/22/2014 FINAL  Final  Blood Culture (routine x 2)     Status: None   Collection Time: 09/16/14  3:30 AM  Result Value Ref Range Status   Specimen Description BLOOD LEFT ARM  Final   Special Requests BOTTLES DRAWN AEROBIC ONLY 10CC  Final   Culture  Setup Time   Final    09/16/2014 08:58 Performed at [place]   Final    NO GROWTH 5 DAYS Performed at [facility]    Report Status 09/22/2014 FINAL  Final  Urine culture     Status: None   Collection Time: 09/16/14  4:09 AM  Result Value Ref Range Status   Specimen Description URINE, RANDOM  Final   Special Requests NONE  Final   Culture  Setup Time   Final     09/16/2014 08:45 Performed at [place]   Final    >=100,000 COLONIES/ML Performed at [facility]    Culture   Final    [place] Performed at [facility]    Report Status 09/18/2014 FINAL  Final   Organism ID, Bacteria KLEBSIELLA SPECIES  Final      Susceptibility   Klebsiella species - MIC*    AMPICILLIN >=32 RESISTANT Resistant     CEFAZOLIN <=4 SENSITIVE Sensitive     CEFTRIAXONE <=1 SENSITIVE Sensitive     CIPROFLOXACIN <=0.25 SENSITIVE Sensitive     GENTAMICIN <=1 SENSITIVE Sensitive     NITROFURANTOIN <=16 SENSITIVE Sensitive     TOBRAMYCIN <=1 SENSITIVE Sensitive     TRIMETH/SULFA <=20 SENSITIVE Sensitive     PIP/TAZO 8 SENSITIVE Sensitive     * KLEBSIELLA SPECIES  MRSA PCR Screening     Status: None   Collection Time: 09/16/14  7:08 AM  Result Value Ref Range Status   MRSA by PCR NEGATIVE NEGATIVE Final    Comment:        The GeneXpert MRSA Assay (FDA approved for NASAL specimens only), is one component of a comprehensive MRSA colonization  surveillance program. It is not intended to diagnose MRSA infection nor to guide or monitor treatment for MRSA infections.   Culture, respiratory (tracheal aspirate)     Status: None   Collection Time: 09/17/14  6:19 AM  Result Value Ref Range Status   Specimen Description TRACHEAL ASPIRATE  Final   Special Requests NONE  Final   Gram Stain   Final    FEW WBC PRESENT,BOTH PMN AND MONONUCLEAR RARE SQUAMOUS EPITHELIAL CELLS PRESENT NO ORGANISMS SEEN Performed at [facility]    Culture   Final    FEW STAPHYLOCOCCUS AUREUS Note: RIFAMPIN AND GENTAMICIN SHOULD NOT BE USED AS SINGLE DRUGS FOR TREATMENT OF STAPH INFECTIONS. Performed at [facility]    Report Status 09/20/2014 FINAL  Final   Organism ID, Bacteria STAPHYLOCOCCUS AUREUS  Final      Susceptibility   Staphylococcus aureus - MIC*    CLINDAMYCIN >=8 RESISTANT Resistant      ERYTHROMYCIN >=8 RESISTANT Resistant     GENTAMICIN <=0.5 SENSITIVE Sensitive     LEVOFLOXACIN 0.25 SENSITIVE Sensitive     OXACILLIN 1 SENSITIVE Sensitive     PENICILLIN >=0.5 RESISTANT Resistant     RIFAMPIN <=0.5 SENSITIVE Sensitive     TRIMETH/SULFA <=10 SENSITIVE Sensitive     VANCOMYCIN 1 SENSITIVE Sensitive     TETRACYCLINE >=16 RESISTANT Resistant     MOXIFLOXACIN <=0.25 SENSITIVE Sensitive     * FEW STAPHYLOCOCCUS AUREUS  Clostridium Difficile by PCR     Status: None   Collection Time: 09/21/14 10:10 PM  Result Value Ref Range Status   C difficile by pcr NEGATIVE NEGATIVE Final  Blood Culture (routine x 2)     Status: None (Preliminary result)   Collection Time: 09/29/14 12:16 PM  Result Value Ref Range Status   Specimen Description BLOOD LEFT HAND  Final   Special Requests BOTTLES DRAWN AEROBIC AND ANAEROBIC 5CC  Final   Culture  Setup Time   Final    09/29/2014 18:08 Performed at [facility]    Culture   Final           BLOOD CULTURE RECEIVED NO GROWTH TO DATE CULTURE WILL BE HELD FOR 5 DAYS BEFORE ISSUING A FINAL NEGATIVE REPORT Performed at [facility]    Report Status PENDING  Incomplete  Blood Culture (routine x 2)     Status: None (Preliminary result)   Collection Time: 09/29/14  1:20 PM  Result Value Ref Range Status   Specimen Description BLOOD RIGHT ANTECUBITAL  Final   Special Requests BOTTLES DRAWN AEROBIC AND ANAEROBIC 5ML  Final   Culture  Setup Time   Final    09/29/2014 18:07 Performed at [facility]    Culture   Final           BLOOD CULTURE RECEIVED NO GROWTH TO DATE CULTURE WILL BE HELD FOR 5 DAYS BEFORE ISSUING A FINAL NEGATIVE REPORT Performed at [facility]    Report Status PENDING  Incomplete  Urine culture     Status: None   Collection Time: 09/29/14  6:28 PM  Result Value Ref Range Status   Specimen Description URINE, CLEAN CATCH  Final   Special Requests NONE  Final   Culture  Setup Time   Final     09/30/2014 02:39 Performed at [place] Performed at [facility]   Final   Culture NO GROWTH Performed at [facility]   Final   Report Status 09/30/2014 FINAL  Final    Anti-infectives    Start     Dose/Rate Route Frequency Ordered Stop   09/30/14 1400  vancomycin (VANCOCIN) IVPB 1000 mg/200 mL premix  Status:  Discontinued     1,000 mg200 mL/hr over 60 Minutes Intravenous Every 24 hours 09/29/14 1326 09/29/14 1848   09/30/14 1400  vancomycin (VANCOCIN) IVPB 1000 mg/200 mL premix  Status:  Discontinued     1,000 mg200 mL/hr over 60 Minutes Intravenous Every 24 hours 09/29/14 1916 09/30/14 0845   09/30/14 1000  vancomycin (VANCOCIN) IVPB 750 mg/150 ml premix     750 mg150 mL/hr over 60 Minutes Intravenous Every 12 hours 09/30/14 0845     09/29/14 2200  aztreonam (AZACTAM) 1 g in dextrose 5 % 50 mL IVPB  Status:  Discontinued     1 g100 mL/hr over 30 Minutes Intravenous 3 times per day 09/29/14 1326 09/29/14 1848   09/29/14 2200  aztreonam (AZACTAM) 1 g in dextrose 5 % 50 mL IVPB  Status:  Discontinued     1 g100 mL/hr over 30 Minutes Intravenous 3 times per day 09/29/14 1916 10/01/14 1426   09/29/14 1300  aztreonam (AZACTAM) 2 g in dextrose 5 % 50 mL IVPB     2 g100 mL/hr over 30 Minutes Intravenous STAT 09/29/14 1239 09/29/14 1355   09/29/14 1245  vancomycin (VANCOCIN) IVPB 1000 mg/200 mL premix     1,000 mg200 mL/hr over 60 Minutes Intravenous  Once 09/29/14 1239 09/29/14 1530       Assessment: Therapeutic vancomycin trough (drawn correctly)  Goal of Therapy:  Vancomycin trough level 15-20 mcg/ml  Plan:  -Continue vancomycin 750 mg IV q12h -Monitor UOP along with SCr trend -Re-check vancomycin trough as clinically indicated   Narda Bonds 10/01/2014,10:56 PM

## 2014-10-01 NOTE — Progress Notes (Signed)
Jason [name] for Infectious Disease         Day # 3 vancomycin Day # 3 aztreonam  Subjective: No new complaints, he was visiting with his mother and several relatives and exchanging gifts.   Antibiotics:  Anti-infectives    Start     Dose/Rate Route Frequency Ordered Stop   09/30/14 1400  vancomycin (VANCOCIN) IVPB 1000 mg/200 mL premix  Status:  Discontinued     1,000 mg200 mL/hr over 60 Minutes Intravenous Every 24 hours 09/29/14 1326 09/29/14 1848   09/30/14 1400  vancomycin (VANCOCIN) IVPB 1000 mg/200 mL premix  Status:  Discontinued     1,000 mg200 mL/hr over 60 Minutes Intravenous Every 24 hours 09/29/14 1916 09/30/14 0845   09/30/14 1000  vancomycin (VANCOCIN) IVPB 750 mg/150 ml premix     750 mg150 mL/hr over 60 Minutes Intravenous Every 12 hours 09/30/14 0845     09/29/14 2200  aztreonam (AZACTAM) 1 g in dextrose 5 % 50 mL IVPB  Status:  Discontinued     1 g100 mL/hr over 30 Minutes Intravenous 3 times per day 09/29/14 1326 09/29/14 1848   09/29/14 2200  aztreonam (AZACTAM) 1 g in dextrose 5 % 50 mL IVPB     1 g100 mL/hr over 30 Minutes Intravenous 3 times per day 09/29/14 1916     09/29/14 1300  aztreonam (AZACTAM) 2 g in dextrose 5 % 50 mL IVPB     2 g100 mL/hr over 30 Minutes Intravenous STAT 09/29/14 1239 09/29/14 1355   09/29/14 1245  vancomycin (VANCOCIN) IVPB 1000 mg/200 mL premix     1,000 mg200 mL/hr over 60 Minutes Intravenous  Once 09/29/14 1239 09/29/14 1530      Medications: Scheduled Meds: . antiseptic oral rinse  7 mL Mouth Rinse BID  . atorvastatin  20 mg Oral Daily  . aztreonam  1 g Intravenous 3 times per day  . bacitracin   Topical BID  . collagenase   Topical Daily  . feeding supplement (GLUCERNA SHAKE)  237 mL Oral TID BM  . heparin  5,000 Units Subcutaneous 3 times per day  . insulin aspart  0-5 Units Subcutaneous QHS  . insulin aspart  0-9 Units Subcutaneous TID WC  . insulin aspart  4 Units Subcutaneous TID WC  . insulin glargine   25 Units Subcutaneous QHS  . loratadine  10 mg Oral Daily  . metoCLOPramide  5 mg Oral Once per day on Mon Thu  . mupirocin cream   Topical BID  . pantoprazole  40 mg Oral Daily  . polyethylene glycol  17 g Oral Daily  . pregabalin  100 mg Oral QID  . sodium chloride  3 mL Intravenous Q12H  . vancomycin  750 mg Intravenous Q12H   Continuous Infusions:  PRN Meds:.acetaminophen **OR** acetaminophen, albuterol, ALPRAZolam, methocarbamol, morphine injection, ondansetron, oxyCODONE    Objective: Weight change: 3 lb 8.4 oz (1.6 kg)  Intake/Output Summary (Last 24 hours) at 10/01/14 1410 Last data filed at 10/01/14 1316  Gross per 24 hour  Intake 3297.67 ml  Output   2550 ml  Net 747.67 ml   Blood pressure 113/77, pulse 72, temperature 98.3 F (36.8 C), temperature source Oral, resp. rate 16, height 5\' 8"  (1.727 m), weight 122 lb 2.2 oz (55.4 kg), SpO2 100 %. Temp:  [98.3 F (36.8 C)-101 F (38.3 C)] 98.3 F (36.8 C) (12/25 1221) Pulse Rate:  [72-101] 72 (12/25 1221) Resp:  [16] 16 (12/24 1639) BP: (93-117)/(60-77)  113/77 mmHg (12/25 1221) SpO2:  [100 %] 100 % (12/25 1221) Weight:  [122 lb 2.2 oz (55.4 kg)] 122 lb 2.2 oz (55.4 kg) (12/25 0408)  Physical Exam: General: Alert and awake, oriented x3, not in any acute distress. HEENT: anicteric sclera, p, EOMI CVS regular rate, normal  Chest:no wheezing,no respiratory distress Abdomen: soft  nondistended,  Extremities: I did not examine his sacral wound, since he was in the middle of visiting with family. I did examine his feet and picture is given below for comparison the last time I saw him.    todays picture 10/01/14:      Neuro: paraplegic  CBC:  Recent Labs Lab 09/29/14 1221 09/30/14 0411 10/01/14 0345  HGB 9.6* 9.5* 8.9*  HCT 29.7* 29.2* 27.7*  PLT 477* 496* 479*     BMET  Recent Labs  09/30/14 0411 10/01/14 0345  NA 137 131*  K 4.0 4.2  CL 110 106  CO2 20 20  GLUCOSE 132* 221*  BUN 24* 11    CREATININE 0.80 0.65  CALCIUM 9.6 9.1     Liver Panel   Recent Labs  09/29/14 1221  PROT 7.7  ALBUMIN 2.5*  AST 11  ALT 11  ALKPHOS 76  BILITOT 0.5       Sedimentation Rate No results for input(s): ESRSEDRATE in the last 72 hours. C-Reactive Protein No results for input(s): CRP in the last 72 hours.  Micro Results: Recent Results (from the past 720 hour(s))  Blood Culture (routine x 2)     Status: None   Collection Time: 09/16/14  3:25 AM  Result Value Ref Range Status   Specimen Description BLOOD RIGHT ARM  Final   Special Requests BOTTLES DRAWN AEROBIC AND ANAEROBIC 10CC  Final   Culture  Setup Time   Final    09/16/2014 08:58 Performed at [place]   Final    NO GROWTH 5 DAYS Performed at [facility]    Report Status 09/22/2014 FINAL  Final  Blood Culture (routine x 2)     Status: None   Collection Time: 09/16/14  3:30 AM  Result Value Ref Range Status   Specimen Description BLOOD LEFT ARM  Final   Special Requests BOTTLES DRAWN AEROBIC ONLY 10CC  Final   Culture  Setup Time   Final    09/16/2014 08:58 Performed at [place]   Final    NO GROWTH 5 DAYS Performed at [facility]    Report Status 09/22/2014 FINAL  Final  Urine culture     Status: None   Collection Time: 09/16/14  4:09 AM  Result Value Ref Range Status   Specimen Description URINE, RANDOM  Final   Special Requests NONE  Final   Culture  Setup Time   Final    09/16/2014 08:45 Performed at [place]   Final    >=100,000 COLONIES/ML Performed at [place] Performed at [facility]    Report Status 09/18/2014 FINAL  Final   Organism ID, Bacteria KLEBSIELLA SPECIES  Final      Susceptibility   Klebsiella species - MIC*    AMPICILLIN >=32 RESISTANT Resistant     CEFAZOLIN <=4 SENSITIVE Sensitive     CEFTRIAXONE <=1 SENSITIVE  Sensitive     CIPROFLOXACIN <=0.25 SENSITIVE Sensitive  GENTAMICIN <=1 SENSITIVE Sensitive     NITROFURANTOIN <=16 SENSITIVE Sensitive     TOBRAMYCIN <=1 SENSITIVE Sensitive     TRIMETH/SULFA <=20 SENSITIVE Sensitive     PIP/TAZO 8 SENSITIVE Sensitive     * KLEBSIELLA SPECIES  MRSA PCR Screening     Status: None   Collection Time: 09/16/14  7:08 AM  Result Value Ref Range Status   MRSA by PCR NEGATIVE NEGATIVE Final    Comment:        The GeneXpert MRSA Assay (FDA approved for NASAL specimens only), is one component of a comprehensive MRSA colonization surveillance program. It is not intended to diagnose MRSA infection nor to guide or monitor treatment for MRSA infections.   Culture, respiratory (tracheal aspirate)     Status: None   Collection Time: 09/17/14  6:19 AM  Result Value Ref Range Status   Specimen Description TRACHEAL ASPIRATE  Final   Special Requests NONE  Final   Gram Stain   Final    FEW WBC PRESENT,BOTH PMN AND MONONUCLEAR RARE SQUAMOUS EPITHELIAL CELLS PRESENT NO ORGANISMS SEEN Performed at [facility]    Culture   Final    FEW STAPHYLOCOCCUS AUREUS Note: RIFAMPIN AND GENTAMICIN SHOULD NOT BE USED AS SINGLE DRUGS FOR TREATMENT OF STAPH INFECTIONS. Performed at [facility]    Report Status 09/20/2014 FINAL  Final   Organism ID, Bacteria STAPHYLOCOCCUS AUREUS  Final      Susceptibility   Staphylococcus aureus - MIC*    CLINDAMYCIN >=8 RESISTANT Resistant     ERYTHROMYCIN >=8 RESISTANT Resistant     GENTAMICIN <=0.5 SENSITIVE Sensitive     LEVOFLOXACIN 0.25 SENSITIVE Sensitive     OXACILLIN 1 SENSITIVE Sensitive     PENICILLIN >=0.5 RESISTANT Resistant     RIFAMPIN <=0.5 SENSITIVE Sensitive     TRIMETH/SULFA <=10 SENSITIVE Sensitive     VANCOMYCIN 1 SENSITIVE Sensitive     TETRACYCLINE >=16 RESISTANT Resistant     MOXIFLOXACIN <=0.25 SENSITIVE Sensitive     * FEW STAPHYLOCOCCUS AUREUS  Clostridium Difficile by PCR     Status:  None   Collection Time: 09/21/14 10:10 PM  Result Value Ref Range Status   C difficile by pcr NEGATIVE NEGATIVE Final  Blood Culture (routine x 2)     Status: None (Preliminary result)   Collection Time: 09/29/14 12:16 PM  Result Value Ref Range Status   Specimen Description BLOOD LEFT HAND  Final   Special Requests BOTTLES DRAWN AEROBIC AND ANAEROBIC 5CC  Final   Culture  Setup Time   Final    09/29/2014 18:08 Performed at [facility]    Culture   Final           BLOOD CULTURE RECEIVED NO GROWTH TO DATE CULTURE WILL BE HELD FOR 5 DAYS BEFORE ISSUING A FINAL NEGATIVE REPORT Performed at [facility]    Report Status PENDING  Incomplete  Blood Culture (routine x 2)     Status: None (Preliminary result)   Collection Time: 09/29/14  1:20 PM  Result Value Ref Range Status   Specimen Description BLOOD RIGHT ANTECUBITAL  Final   Special Requests BOTTLES DRAWN AEROBIC AND ANAEROBIC 5ML  Final   Culture  Setup Time   Final    09/29/2014 18:07 Performed at [facility]    Culture   Final           BLOOD CULTURE RECEIVED NO GROWTH TO DATE CULTURE WILL BE HELD FOR  5 DAYS BEFORE ISSUING A FINAL NEGATIVE REPORT Performed at [facility]    Report Status PENDING  Incomplete  Urine culture     Status: None   Collection Time: 09/29/14  6:28 PM  Result Value Ref Range Status   Specimen Description URINE, CLEAN CATCH  Final   Special Requests NONE  Final   Culture  Setup Time   Final    09/30/2014 02:39 Performed at [place] Performed at [facility]   Final   Culture NO GROWTH Performed at [facility]   Final   Report Status 09/30/2014 FINAL  Final    Studies/Results: No results found.    Assessment/Plan:  Active Problems:   GERD   Protein-calorie malnutrition, severe   IDDM (insulin dependent diabetes mellitus)   Spinal cord infarction (history of)   Sepsis   UTI (lower urinary tract  infection)    Jason [name] is a 28 y.o. male with  Admission for MSSA bacteremia In November. 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 He did not undergo debridement but was followed closely General surgery and had some purulent material drained spontaneous once the eschar was removed while he was receiving hydrotherapy. There also been concern for possible infection of his feet. He was supposed to receive 6-8 weeks of IV antibiotics but only received 4 weeks. He was again admitted in December to the ICU and MRI done during that admission of pelvis which had shown an enlarging multiloculated fluid collection around his ischial his tuberosity. He was seen again by general surgery but they felt that he did not need debridement of this site. Here MRI performed of his left foot which had suggested an abscess near his calcaneus. I do not see that we were reconsulted at that time. Ultimately was discharged home on the 18th apparently without any antibiotics at all. He has been readmitted only 5 days later with a septic picture.  #1 Sepsis: the OBVIOUS PRIME SUSPECT is his ischial area which per "mother" is not looking to be progressing well. Radiology described a multiloculated abscess in this area although general surgery seem to have been less impressed at the bedside.  I would like to examine it but did not do so today because he was in the middle of exchanging gets with his family I will come back tomorrow to reexamine it. We can get yet another MRI to see what this area looks like as well but I suspect he is going to need some deep debridement by general surgery if not orthopedic surgery because the patient has had osteomyelitis by imaging and has been readmitted with a septic picture. I suspect this source is what seeded his blood with Streptococcus aureus.  I also wonder about his foot although on exam he does not have an obvious abscess.  I will  discontinue his aspirin and continue vancomycin for now.    LOS: 2 days   Jason [name] 10/01/2014, 2:10 PM

## 2014-10-01 NOTE — Progress Notes (Addendum)
PROGRESS NOTE    Jason [name] WUX:[id number] DOB: [birth year] DOA: 09/29/2014 PCP: Jason Morale, MD  HPI/Brief narrative 28 year old male patient with history of spinal infarct in April 2015, quadriparesis/paraplegia, poorly controlled type II DM, recently treated for MSSA bacteremia at SNF for vancomycin, now 2 weeks off, chronic indwelling Foley catheter, not on home oxygen, presented to ED with altered mental status/somnolence, leukocytosis, fever and chills. Infectious disease consulting.   Assessment/Plan:  1. Sepsis/fever: Exact source not known but possibly UTI given history of intermittent purulent drainage from the penis. Foley catheter changed this admission. Blood cultures 2 negative to date. Persistent left lower lobe consolidation on chest x-ray but no respiratory symptoms. Recently treated for MSSA bacteremia. Recent TEE was negative. Wound care consultation appreciated. Infectious disease follow-up appreciated. Continue IV vancomycin and aztreonam for now. Spiked low-grade temperature of 101F. 2. Possible UTI: Continue IV aztreonam pending culture results. Urine cultures negative but had purulent discharge from penis. 3. Acute encephalopathy: Secondary to problem #1. Resolved. No focal deficits. 4. Acute kidney injury: Related to sepsis and dehydration. Resolved. 5. Type II DM/IDDM: Continue Lantus and SSI and adjust as needed. 6. History of severe protein calorie malnutrition: Continue nutritional supplements. 7. Sacral decubitus ulcer: Wound care consultation on 12/24 appreciated. Patient has multiple sacral/buttock stage III ulcers, right ischial stage IV ulcer, left foot with several full-thickness wounds, fifth toe and fourth toe wound. Management per wound care team 8. History of GERD: Continue PPI. 9. History of spinal cord infarction and quadriparesis: Paraplegic in lower extremities and weak in the upper extremities. 10. Dehydration with hyponatremia: resolved.  DC IVF 11. Leukocytosis: Secondary to problem #1. Follow CBCs. Improving. 12. History of recently treated MSSA bacteremia: Continue above antibiotics and follow blood culture results. 13. Hypertension: Soft blood pressures. Hold antihypertensives. 14. Chronic benzodiazepine use: Continue same. 15. Chronic Foley catheter and traumatic hypospadias: Patient states that urology had consulted in the past and recommended suprapubic catheter but he was not medically stable to get it at that time. Discussed with urologist on call Jason [name] on 12/24 would recommended looping the Foley catheter and taping it to abdomen to reduce gravity related further trauma to penis and decreasing the chance of urethral stricture. He also recommended Neosporin ointment or the junction of the Foley catheter in the penis and outpatient follow-up with urology to evaluate whether he would benefit from a suprapubic catheter. 16. Acute on chronic Anemia: Jason [name] secondary to acute illness and dilutional. No reported bleeding. Follow CBC in a.m. and transfuse if hemoglobin less than 7 g per DL. Baseline hemoglobin probably in the mid 9 g per DL range   Code Status:  Full Family Communication:  None at bedside. Disposition Plan:  Home when medically stable   Consultants:   Infectious disease  Procedures:   Changed Foley catheter  Antibiotics:   IV vancomycin and 12/23 >  IV aztreonam 12/23 >   Subjective: Denies complaints. As per nursing no acute events.  Objective: Filed Vitals:   10/01/14 0026 10/01/14 0355 10/01/14 0408 10/01/14 0756  BP:  108/65  93/65  Pulse: 84 83  73  Temp:  101 F (38.3 C)  98.6 F (37 C)  TempSrc:  Oral  Oral  Resp:      Height:   5\' 8"  (1.727 m)   Weight:   55.4 kg (122 lb 2.2 oz)   SpO2: 100% 100%  100%    Intake/Output Summary (Last 24 hours) at 10/01/14 1018 Last data  filed at 10/01/14 0854  Gross per 24 hour  Intake 3404.67 ml  Output   4250 ml  Net -845.33 ml    Filed Weights   09/30/14 0428 10/01/14 0408  Weight: 53.8 kg (118 lb 9.7 oz) 55.4 kg (122 lb 2.2 oz)     Exam:  General exam:  Moderately built and thinly nourished chronically ill-looking young male lying comfortably in bed. Respiratory system: Clear. No increased work of breathing. Cardiovascular system: S1 & S2 heard, RRR. No JVD, murmurs, gallops, clicks or pedal edema. Telemetry: Sinus rhythm. Gastrointestinal system: Abdomen is nondistended, soft and nontender. Normal bowel sounds heard. Central nervous system: Alert and oriented. No focal neurological deficits. Extremities:  Grade 0 x 5 power in lower extremities and grade 3 x 5 power in upper extremities proximally and 0 x 5 power distally with contractures of fingers. Skin: Multiple wounds > evaluation as per [place] notes 12/24   Data Reviewed: Basic Metabolic Panel:  Recent Labs Lab 09/29/14 1221 09/29/14 1916 09/30/14 0411 10/01/14 0345  NA 131*  --  137 131*  K 4.6  --  4.0 4.2  CL 104  --  110 106  CO2 17*  --  20 20  GLUCOSE 338*  --  132* 221*  BUN 39*  --  24* 11  CREATININE 1.47*  --  0.80 0.65  CALCIUM 9.2  --  9.6 9.1  MG  --  1.5  --   --   PHOS  --  3.2  --   --    Liver Function Tests:  Recent Labs Lab 09/29/14 1221  AST 11  ALT 11  ALKPHOS 76  BILITOT 0.5  PROT 7.7  ALBUMIN 2.5*   No results for input(s): LIPASE, AMYLASE in the last 168 hours. No results for input(s): AMMONIA in the last 168 hours. CBC:  Recent Labs Lab 09/29/14 1221 09/30/14 0411 10/01/14 0345  WBC 27.2* 25.6* 16.5*  NEUTROABS 20.1*  --   --   HGB 9.6* 9.5* 8.9*  HCT 29.7* 29.2* 27.7*  MCV 83.9 82.5 82.2  PLT 477* 496* 479*   Cardiac Enzymes: No results for input(s): CKTOTAL, CKMB, CKMBINDEX, TROPONINI in the last 168 hours. BNP (last 3 results)  Recent Labs  10/02/13 0445 01/29/14 2113  PROBNP 2451.0* 1343.0*   CBG:  Recent Labs Lab 09/30/14 0752 09/30/14 1129 09/30/14 1638  09/30/14 2206 10/01/14 0755  GLUCAP 141* 214* 168* 243* 150*    Recent Results (from the past 240 hour(s))  Clostridium Difficile by PCR     Status: None   Collection Time: 09/21/14 10:10 PM  Result Value Ref Range Status   C difficile by pcr NEGATIVE NEGATIVE Final  Blood Culture (routine x 2)     Status: None (Preliminary result)   Collection Time: 09/29/14 12:16 PM  Result Value Ref Range Status   Specimen Description BLOOD LEFT HAND  Final   Special Requests BOTTLES DRAWN AEROBIC AND ANAEROBIC 5CC  Final   Culture  Setup Time   Final    09/29/2014 18:08 Performed at [facility]    Culture   Final           BLOOD CULTURE RECEIVED NO GROWTH TO DATE CULTURE WILL BE HELD FOR 5 DAYS BEFORE ISSUING A FINAL NEGATIVE REPORT Performed at [facility]    Report Status PENDING  Incomplete  Blood Culture (routine x 2)     Status: None (Preliminary result)   Collection Time: 09/29/14  1:20 PM  Result Value Ref Range Status   Specimen Description BLOOD RIGHT ANTECUBITAL  Final   Special Requests BOTTLES DRAWN AEROBIC AND ANAEROBIC 5ML  Final   Culture  Setup Time   Final    09/29/2014 18:07 Performed at [facility]    Culture   Final           BLOOD CULTURE RECEIVED NO GROWTH TO DATE CULTURE WILL BE HELD FOR 5 DAYS BEFORE ISSUING A FINAL NEGATIVE REPORT Performed at [facility]    Report Status PENDING  Incomplete  Urine culture     Status: None   Collection Time: 09/29/14  6:28 PM  Result Value Ref Range Status   Specimen Description URINE, CLEAN CATCH  Final   Special Requests NONE  Final   Culture  Setup Time   Final    09/30/2014 02:39 Performed at [place] Performed at [facility]   Final   Culture NO GROWTH Performed at [facility]   Final   Report Status 09/30/2014 FINAL  Final         Studies: Dg Chest Port 1 View  09/29/2014   CLINICAL DATA:  Shortness of Breath   EXAM: PORTABLE CHEST - 1 VIEW  COMPARISON:  September 24, 2014  FINDINGS: There is persistent left lower lobe consolidation. Right lung is now clear. Heart size and pulmonary vascularity are normal. No adenopathy. No bone lesions.  IMPRESSION: Persistent left lower lobe consolidation. Right lung clear. Lungs elsewhere clear. No change in cardiac silhouette.   Electronically Signed   By: Lowella Grip M.D.   On: 09/29/2014 12:44        Scheduled Meds: . antiseptic oral rinse  7 mL Mouth Rinse BID  . aztreonam  1 g Intravenous 3 times per day  . bacitracin   Topical BID  . collagenase   Topical Daily  . feeding supplement (GLUCERNA SHAKE)  237 mL Oral TID BM  . heparin  5,000 Units Subcutaneous 3 times per day  . insulin aspart  0-5 Units Subcutaneous QHS  . insulin aspart  0-9 Units Subcutaneous TID WC  . insulin glargine  25 Units Subcutaneous QHS  . metoCLOPramide  5 mg Oral Once per day on Mon Thu  . mupirocin cream   Topical BID  . pantoprazole  40 mg Oral Daily  . pregabalin  100 mg Oral QID  . sodium chloride  3 mL Intravenous Q12H  . vancomycin  750 mg Intravenous Q12H   Continuous Infusions:    Active Problems:   GERD   Protein-calorie malnutrition, severe   IDDM (insulin dependent diabetes mellitus)   Spinal cord infarction (history of)   Sepsis   UTI (lower urinary tract infection)    Time spent: 40 minutes.    Vernell Leep, MD, FACP, FHM. [facility] Pager [phone]  If 7PM-7AM, please contact night-coverage [email or web address] Password [email or web address] 10/01/2014, 10:18 AM    LOS: 2 days

## 2014-10-02 ENCOUNTER — Inpatient Hospital Stay (HOSPITAL_COMMUNITY): Payer: Medicaid Other

## 2014-10-02 DIAGNOSIS — L0291 Cutaneous abscess, unspecified: Secondary | ICD-10-CM | POA: Insufficient documentation

## 2014-10-02 DIAGNOSIS — L03119 Cellulitis of unspecified part of limb: Secondary | ICD-10-CM

## 2014-10-02 DIAGNOSIS — M869 Osteomyelitis, unspecified: Secondary | ICD-10-CM

## 2014-10-02 DIAGNOSIS — L02419 Cutaneous abscess of limb, unspecified: Secondary | ICD-10-CM

## 2014-10-02 LAB — GLUCOSE, CAPILLARY
GLUCOSE-CAPILLARY: 138 mg/dL — AB (ref 70–99)
Glucose-Capillary: 151 mg/dL — ABNORMAL HIGH (ref 70–99)
Glucose-Capillary: 73 mg/dL (ref 70–99)
Glucose-Capillary: 151 mg/dL — ABNORMAL HIGH (ref 70–99)

## 2014-10-02 LAB — BASIC METABOLIC PANEL
Anion gap: 6 (ref 5–15)
BUN: 10 mg/dL (ref 6–23)
CHLORIDE: 105 meq/L (ref 96–112)
CO2: 22 mmol/L (ref 19–32)
Calcium: 9.4 mg/dL (ref 8.4–10.5)
Creatinine, Ser: 0.6 mg/dL (ref 0.50–1.35)
GFR calc Af Amer: >90 mL/min (ref >90)
GFR calc non Af Amer: >90 mL/min (ref >90)
Glucose, Bld: 173 mg/dL — ABNORMAL HIGH (ref 70–99)
Potassium: 4 mmol/L (ref 3.5–5.1)
Sodium: 133 mmol/L — ABNORMAL LOW (ref 135–145)

## 2014-10-02 LAB — CBC
HEMATOCRIT: 26.5 % — AB (ref 39.0–52.0)
Hemoglobin: 8.5 g/dL — ABNORMAL LOW (ref 13.0–17.0)
MCH: 26.2 pg (ref 26.0–34.0)
MCHC: 32.1 g/dL (ref 30.0–36.0)
MCV: 81.8 fL (ref 78.0–100.0)
Platelets: 567 10*3/uL — ABNORMAL HIGH (ref 150–400)
RBC: 3.24 MIL/uL — ABNORMAL LOW (ref 4.22–5.81)
RDW: 15.4 % (ref 11.5–15.5)
WBC: 15.2 10*3/uL — AB (ref 4.0–10.5)

## 2014-10-02 MED ORDER — ALPRAZOLAM 0.5 MG PO TABS: 1.0000 mg | ORAL_TABLET | Freq: Once | ORAL | Status: AC | PRN

## 2014-10-02 MED ORDER — GADOBENATE DIMEGLUMINE 529 MG/ML IV SOLN
12.0000 mL | Freq: Once | INTRAVENOUS | Status: AC | PRN
  Administered 2014-10-02: 12 mL via INTRAVENOUS

## 2014-10-02 NOTE — Progress Notes (Signed)
Patient went to MRI per hospital bed.  Report was already given to Joellen Jersey, RN- receiving nurse in 5W 27.

## 2014-10-02 NOTE — Progress Notes (Addendum)
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 Infectious disease consulting.   Assessment/Plan:  1. Sepsis/fever: Exact source not known but possibly UTI given history of intermittent purulent drainage from the penis or ischial/foot wound infection. Foley catheter changed this admission. Blood cultures 2 negative to date. Persistent left lower lobe consolidation on chest x-ray but no respiratory symptoms. Recently treated for MSSA bacteremia. Recent TEE was negative. Wound care consultation appreciated. Infectious disease follow-up 12/25 appreciated-plan to further evaluate potential skin sources for infection-will await further recommendations. Continue IV vancomycin. Aztreonam seems to have been discontinued by ID on 12/26. No fevers in the last 24 hours.  2. Possible UTI: Continue IV aztreonam pending culture results. Urine cultures negative but had purulent discharge from penis. 3. Acute encephalopathy: Secondary to problem #1. Resolved. No focal deficits. 4. Acute kidney injury: Related to sepsis and dehydration. Resolved. 5. Type II DM/IDDM: Continue Lantus and SSI and adjust as needed. Good inpatient control. 6. History of severe protein calorie malnutrition: Continue nutritional supplements. 7. Sacral decubitus ulcer: Wound care consultation on 12/24 appreciated. Patient has multiple sacral/buttock stage III ulcers, right ischial stage IV ulcer, left foot with several full-thickness wounds, fifth toe and fourth toe wound. Management per wound care team. ID plans to  further evaluate. 8. History of GERD: Continue PPI. 9. History of spinal cord infarction and quadriparesis: Paraplegic in lower extremities and weak in the upper extremities. 10. Dehydration with hyponatremia: resolved. DC IVF 11. Leukocytosis: Secondary to problem #1. Follow CBCs. Improving. 12. History of recently treated MSSA bacteremia: Continue above antibiotics and follow blood culture results. 13. Hypertension: Soft blood pressures. Hold antihypertensives. 14. Chronic benzodiazepine use: Continue same. 15. Chronic Foley catheter and traumatic hypospadias: Patient states that urology had consulted in the past and recommended suprapubic catheter but he was not medically stable to get it at that time. Discussed with urologist on call Dr. Era Bumpers on 12/24 would recommended looping the Foley catheter and taping it to abdomen to reduce gravity related further trauma to penis and decreasing the chance of urethral stricture. He also recommended Neosporin ointment or the junction of the Foley catheter in the penis and outpatient follow-up with urology to evaluate whether he would benefit from a suprapubic catheter. 16. Acute on chronic Anemia: [name] secondary to acute illness and dilutional. No reported bleeding. Follow CBC in a.m. and transfuse if hemoglobin less than 7 g per DL. Baseline hemoglobin probably in the mid 9 g per DL range. Hemoglobin relatively stable.   Code Status:  Full Family Communication:  None at bedside. Disposition Plan:  Transfer to medical bed.   Consultants:   Infectious disease  Procedures:   Changed Foley catheter  Antibiotics:   IV vancomycin and 12/23 >  IV aztreonam 12/23 >   Subjective: Denies complaints. As per nursing no acute events.  Objective: Filed Vitals:   10/01/14 2050 10/01/14 2347 10/02/14 0455 10/02/14 0731  BP: 125/78 118/78 102/66 100/70  Pulse: 73 74 75 75  Temp: 97.9 F (36.6 C) 98.1 F (36.7 C) 98.1 F (36.7 C) 97.1 F (36.2  C)  TempSrc: Oral Oral Oral Oral  Resp:  16  Height:      Weight:   55.3 kg (121 lb 14.6 oz)   SpO2: 100% 100% 100% 100%    Intake/Output Summary (Last 24 hours) at 10/02/14 1313 Last data filed at 10/02/14 0919  Gross per 24 hour  Intake   1333 ml  Output   4150 ml  Net  -2817 ml   Filed Weights   09/30/14 0428 10/01/14 0408 10/02/14 0455  Weight: 53.8 kg (118 lb 9.7 oz) 55.4 kg (122 lb 2.2 oz) 55.3 kg (121 lb 14.6 oz)     Exam:  General exam:  Moderately built and thinly nourished chronically ill-looking young male lying comfortably in bed. Respiratory system: Clear. No increased work of breathing. Cardiovascular system: S1 & S2 heard, RRR. No JVD, murmurs, gallops, clicks or pedal edema. Telemetry: Sinus rhythm. Gastrointestinal system: Abdomen is nondistended, soft and nontender. Normal bowel sounds heard. Central nervous system: Alert and oriented. No focal neurological deficits. Extremities:  Grade 0 x 5 power in lower extremities and grade 3 x 5 power in upper extremities proximally and 0 x 5 power distally with contractures of fingers. Skin: Multiple wounds > evaluation as per [name] notes 12/24   Data Reviewed: Basic Metabolic Panel:  Recent Labs Lab 09/29/14 1221 09/29/14 1916 09/30/14 0411 10/01/14 0345 10/02/14 0334  NA 131*  --  137 131* 133*  K 4.6  --  4.0 4.2 4.0  CL 104  --  110 106 105  CO2 17*  --  20 20 22   GLUCOSE 338*  --  132* 221* 173*  BUN 39*  --  24* 11 10  CREATININE 1.47*  --  0.80 0.65 0.60  CALCIUM 9.2  --  9.6 9.1 9.4  MG  --  1.5  --   --   --   PHOS  --  3.2  --   --   --    Liver Function Tests:  Recent Labs Lab 09/29/14 1221  AST 11  ALT 11  ALKPHOS 76  BILITOT 0.5  PROT 7.7  ALBUMIN 2.5*   No results for input(s): LIPASE, AMYLASE in the last 168 hours. No results for input(s): AMMONIA in the last 168 hours. CBC:  Recent Labs Lab 09/29/14 1221 09/30/14 0411 10/01/14 0345 10/02/14 0334  WBC 27.2*  25.6* 16.5* 15.2*  NEUTROABS 20.1*  --   --   --   HGB 9.6* 9.5* 8.9* 8.5*  HCT 29.7* 29.2* 27.7* 26.5*  MCV 83.9 82.5 82.2 81.8  PLT 477* 496* 479* 567*   Cardiac Enzymes: No results for input(s): CKTOTAL, CKMB, CKMBINDEX, TROPONINI in the last 168 hours. BNP (last 3 results)  Recent Labs  01/29/14 2113  PROBNP 1343.0*   CBG:  Recent Labs Lab 10/01/14 1218 10/01/14 1640 10/01/14 2217 10/02/14 0753 10/02/14 1226  GLUCAP 174* 128* 151* 138* 151*    Recent Results (from the past 240 hour(s))  Blood Culture (routine x 2)     Status: None (Preliminary result)   Collection Time: 09/29/14 12:16 PM  Result Value Ref Range Status   Specimen Description BLOOD LEFT HAND  Final   Special Requests BOTTLES DRAWN AEROBIC AND ANAEROBIC 5CC  Final   Culture  Setup Time   Final    09/29/2014 18:08 Performed at [facility]    Culture   Final           BLOOD CULTURE RECEIVED NO GROWTH TO DATE CULTURE WILL BE HELD FOR 5 DAYS BEFORE  ISSUING A FINAL NEGATIVE REPORT Performed at [facility]    Report Status PENDING  Incomplete  Blood Culture (routine x 2)     Status: None (Preliminary result)   Collection Time: 09/29/14  1:20 PM  Result Value Ref Range Status   Specimen Description BLOOD RIGHT ANTECUBITAL  Final   Special Requests BOTTLES DRAWN AEROBIC AND ANAEROBIC 5ML  Final   Culture  Setup Time   Final    09/29/2014 18:07 Performed at [facility]    Culture   Final           BLOOD CULTURE RECEIVED NO GROWTH TO DATE CULTURE WILL BE HELD FOR 5 DAYS BEFORE ISSUING A FINAL NEGATIVE REPORT Performed at [facility]    Report Status PENDING  Incomplete  Urine culture     Status: None   Collection Time: 09/29/14  6:28 PM  Result Value Ref Range Status   Specimen Description URINE, CLEAN CATCH  Final   Special Requests NONE  Final   Culture  Setup Time   Final    09/30/2014 02:39 Performed at [place] Performed at [facility]   Final   Culture NO GROWTH Performed at [facility]   Final   Report Status 09/30/2014 FINAL  Final         Studies: No results found.      Scheduled Meds: . antiseptic oral rinse  7 mL Mouth Rinse BID  . atorvastatin  20 mg Oral Daily  . bacitracin   Topical BID  . collagenase   Topical Daily  . feeding supplement (GLUCERNA SHAKE)  237 mL Oral TID BM  . heparin  5,000 Units Subcutaneous 3 times per day  . insulin aspart  0-5 Units Subcutaneous QHS  . insulin aspart  0-9 Units Subcutaneous TID WC  . insulin aspart  4 Units Subcutaneous TID WC  . insulin glargine  25 Units Subcutaneous QHS  . loratadine  10 mg Oral Daily  . metoCLOPramide  5 mg Oral Once per day on Mon Thu  . mupirocin cream   Topical BID  . pantoprazole  40 mg Oral Daily  . polyethylene glycol  17 g Oral Daily  . pregabalin  100 mg Oral QID  . sodium chloride  3 mL Intravenous Q12H  . vancomycin  750 mg Intravenous Q12H   Continuous Infusions:    Active Problems:   GERD   Protein-calorie malnutrition, severe   IDDM (insulin dependent diabetes mellitus)   Spinal cord infarction (history of)   Sepsis   UTI (lower urinary tract infection)   Osteomyelitis, pelvis   Cellulitis and abscess of leg, except foot    Time spent: 30 minutes.    Vernell Leep, MD, FACP, FHM. [facility] Pager [phone]  If 7PM-7AM, please contact night-coverage [email or web address] Password [email or web address] 10/02/2014, 1:13 PM    LOS: 3 days

## 2014-10-02 NOTE — Progress Notes (Signed)
Patient's fiancee is notified of this transfer.

## 2014-10-02 NOTE — Progress Notes (Addendum)
NURSING PROGRESS NOTE  Jason [name] [id number] Transfer Data: 10/02/2014 6:57 PM Attending Provider: Modena Jansky, MD PCP:FRY,STEPHEN A, MD Code Status: FULL  Jason [name] is a 28 y.o. male patient transferred from 2c  -No acute distress noted.  -No complaints of shortness of breath.  -No complaints of chest pain.    Blood pressure 116/76, pulse 70, temperature 98 F (36.7 C), temperature source Oral, resp. rate 17, height 5\' 8"  (1.727 m), weight 57 kg (125 lb 10.6 oz), SpO2 97 %.   IV Fluids:  IV in place, occlusive dsg intact without redness, IV cath on Right arm, SL.  Allergies:  Cefuroxime axetil; Penicillins; Tessalon; and Shellfish allergy  Past Medical History:   has a past medical history of GERD (gastroesophageal reflux disease); Asthma; MRSA infection; Gastroparesis; Diabetic neuropathy; Seizures; Stroke; Diabetes mellitus; Family history of anesthesia complication; Dysrhythmia; Pneumonia; Arthritis; and Fibromyalgia.  Past Surgical History:   has past surgical history that includes Tonsillectomy; Multiple extractions with alveoloplasty (N/A, 08/03/2014); and TEE without cardioversion (N/A, 08/17/2014).  Social History:   reports that he has been smoking Cigars and Cigarettes.  He has been smoking about 0.00 packs per day. He has never used smokeless tobacco. He reports that he does not drink alcohol or use illicit drugs.  Skin: Skin tear noted to the back, Right side, covered with foam. Also, wound to sacrum noted and covered with foam, unable to assess at this time.   Patient/Family orientated to room. Information packet given to patient/family. Admission inpatient armband information verified with patient/family to include name and date of birth and placed on patient arm. Side rails up x 2, fall assessment and education completed with patient/family. Patient/family able to verbalize understanding of risk associated with falls and verbalized understanding to call  for assistance before getting out of bed. Call light within reach. Patient/family able to voice and demonstrate understanding of unit orientation instructions.    Will continue to evaluate and treat per MD orders.

## 2014-10-02 NOTE — Progress Notes (Signed)
[place] for Infectious Disease         Day # 4 vancomycin  Also received 3 days of aztreonam  Subjective: No new complaints   Antibiotics:  Anti-infectives    Start     Dose/Rate Route Frequency Ordered Stop   09/30/14 1400  vancomycin (VANCOCIN) IVPB 1000 mg/200 mL premix  Status:  Discontinued     1,000 mg200 mL/hr over 60 Minutes Intravenous Every 24 hours 09/29/14 1326 09/29/14 1848   09/30/14 1400  vancomycin (VANCOCIN) IVPB 1000 mg/200 mL premix  Status:  Discontinued     1,000 mg200 mL/hr over 60 Minutes Intravenous Every 24 hours 09/29/14 1916 09/30/14 0845   09/30/14 1000  vancomycin (VANCOCIN) IVPB 750 mg/150 ml premix     750 mg150 mL/hr over 60 Minutes Intravenous Every 12 hours 09/30/14 0845     09/29/14 2200  aztreonam (AZACTAM) 1 g in dextrose 5 % 50 mL IVPB  Status:  Discontinued     1 g100 mL/hr over 30 Minutes Intravenous 3 times per day 09/29/14 1326 09/29/14 1848   09/29/14 2200  aztreonam (AZACTAM) 1 g in dextrose 5 % 50 mL IVPB  Status:  Discontinued     1 g100 mL/hr over 30 Minutes Intravenous 3 times per day 09/29/14 1916 10/01/14 1426   09/29/14 1300  aztreonam (AZACTAM) 2 g in dextrose 5 % 50 mL IVPB     2 g100 mL/hr over 30 Minutes Intravenous STAT 09/29/14 1239 09/29/14 1355   09/29/14 1245  vancomycin (VANCOCIN) IVPB 1000 mg/200 mL premix     1,000 mg200 mL/hr over 60 Minutes Intravenous  Once 09/29/14 1239 09/29/14 1530      Medications: Scheduled Meds: . antiseptic oral rinse  7 mL Mouth Rinse BID  . atorvastatin  20 mg Oral Daily  . bacitracin   Topical BID  . collagenase   Topical Daily  . feeding supplement (GLUCERNA SHAKE)  237 mL Oral TID BM  . heparin  5,000 Units Subcutaneous 3 times per day  . insulin aspart  0-5 Units Subcutaneous QHS  . insulin aspart  0-9 Units Subcutaneous TID WC  . insulin aspart  4 Units Subcutaneous TID WC  . insulin glargine  25 Units Subcutaneous QHS  . loratadine  10 mg Oral Daily  .  metoCLOPramide  5 mg Oral Once per day on Mon Thu  . mupirocin cream   Topical BID  . pantoprazole  40 mg Oral Daily  . polyethylene glycol  17 g Oral Daily  . pregabalin  100 mg Oral QID  . sodium chloride  3 mL Intravenous Q12H  . vancomycin  750 mg Intravenous Q12H   Continuous Infusions:  PRN Meds:.acetaminophen **OR** acetaminophen, albuterol, ALPRAZolam, ALPRAZolam, methocarbamol, morphine injection, ondansetron, oxyCODONE    Objective: Weight change: -3.5 oz (-0.1 kg)  Intake/Output Summary (Last 24 hours) at 10/02/14 1705 Last data filed at 10/02/14 1300  Gross per 24 hour  Intake   1213 ml  Output   4850 ml  Net  -3637 ml   Blood pressure 100/65, pulse 70, temperature 97.8 F (36.6 C), temperature source Oral, resp. rate 16, height 5\' 8"  (1.727 m), weight 121 lb 14.6 oz (55.3 kg), SpO2 100 %. Temp:  [97.1 F (36.2 C)-98.3 F (36.8 C)] 97.8 F (36.6 C) (12/26 1603) Pulse Rate:  [70-75] 70 (12/26 1603) Resp:  [16] 16 (12/26 1603) BP: (100-125)/(65-78) 100/65 mmHg (12/26 1603) SpO2:  [100 %] 100 % (12/26 1603) Weight:  [  121 lb 14.6 oz (55.3 kg)] 121 lb 14.6 oz (55.3 kg) (12/26 0455)  Physical Exam: General: Alert and awake, oriented x3, not in any acute distress. HEENT: anicteric sclera, p, EOMI CVS regular rate, normal  Chest:no wheezing,no respiratory distress Abdomen: soft  nondistended,  Extremities:  Sacral decubitus ulcers past pictures and today:  08/12/2014:       todays picture sacrum 10/02/14:         Left foot 10/01/2014:       Neuro: paraplegic  CBC:  Recent Labs Lab 09/29/14 1221 09/30/14 0411 10/01/14 0345 10/02/14 0334  HGB 9.6* 9.5* 8.9* 8.5*  HCT 29.7* 29.2* 27.7* 26.5*  PLT 477* 496* 479* 567*     BMET  Recent Labs  10/01/14 0345 10/02/14 0334  NA 131* 133*  K 4.2 4.0  CL 106 105  CO2 20 22  GLUCOSE 221* 173*  BUN 11 10  CREATININE 0.65 0.60  CALCIUM 9.1 9.4     Liver Panel  No results for  input(s): PROT, ALBUMIN, AST, ALT, ALKPHOS, BILITOT, BILIDIR, IBILI in the last 72 hours.     Sedimentation Rate No results for input(s): ESRSEDRATE in the last 72 hours. C-Reactive Protein No results for input(s): CRP in the last 72 hours.  Micro Results: Recent Results (from the past 720 hour(s))  Blood Culture (routine x 2)     Status: None   Collection Time: 09/16/14  3:25 AM  Result Value Ref Range Status   Specimen Description BLOOD RIGHT ARM  Final   Special Requests BOTTLES DRAWN AEROBIC AND ANAEROBIC 10CC  Final   Culture  Setup Time   Final    09/16/2014 08:58 Performed at [place]   Final    NO GROWTH 5 DAYS Performed at [facility]    Report Status 09/22/2014 FINAL  Final  Blood Culture (routine x 2)     Status: None   Collection Time: 09/16/14  3:30 AM  Result Value Ref Range Status   Specimen Description BLOOD LEFT ARM  Final   Special Requests BOTTLES DRAWN AEROBIC ONLY 10CC  Final   Culture  Setup Time   Final    09/16/2014 08:58 Performed at [place]   Final    NO GROWTH 5 DAYS Performed at [facility]    Report Status 09/22/2014 FINAL  Final  Urine culture     Status: None   Collection Time: 09/16/14  4:09 AM  Result Value Ref Range Status   Specimen Description URINE, RANDOM  Final   Special Requests NONE  Final   Culture  Setup Time   Final    09/16/2014 08:45 Performed at [place]   Final    >=100,000 COLONIES/ML Performed at [place]   Final    KLEBSIELLA SPECIES Performed at [facility]    Report Status 09/18/2014 FINAL  Final   Organism ID, Bacteria KLEBSIELLA SPECIES  Final      Susceptibility   Klebsiella species - MIC*    AMPICILLIN >=32 RESISTANT Resistant     CEFAZOLIN <=4 SENSITIVE Sensitive     CEFTRIAXONE <=1 SENSITIVE Sensitive     CIPROFLOXACIN <=0.25 SENSITIVE Sensitive     GENTAMICIN <=1  SENSITIVE Sensitive     NITROFURANTOIN <=16 SENSITIVE Sensitive     TOBRAMYCIN <=1 SENSITIVE Sensitive     TRIMETH/SULFA <=20 SENSITIVE Sensitive  PIP/TAZO 8 SENSITIVE Sensitive     * KLEBSIELLA SPECIES  MRSA PCR Screening     Status: None   Collection Time: 09/16/14  7:08 AM  Result Value Ref Range Status   MRSA by PCR NEGATIVE NEGATIVE Final    Comment:        The GeneXpert MRSA Assay (FDA approved for NASAL specimens only), is one component of a comprehensive MRSA colonization surveillance program. It is not intended to diagnose MRSA infection nor to guide or monitor treatment for MRSA infections.   Culture, respiratory (tracheal aspirate)     Status: None   Collection Time: 09/17/14  6:19 AM  Result Value Ref Range Status   Specimen Description TRACHEAL ASPIRATE  Final   Special Requests NONE  Final   Gram Stain   Final    FEW WBC PRESENT,BOTH PMN AND MONONUCLEAR RARE SQUAMOUS EPITHELIAL CELLS PRESENT NO ORGANISMS SEEN Performed at [facility]    Culture   Final    FEW STAPHYLOCOCCUS AUREUS Note: RIFAMPIN AND GENTAMICIN SHOULD NOT BE USED AS SINGLE DRUGS FOR TREATMENT OF STAPH INFECTIONS. Performed at [facility]    Report Status 09/20/2014 FINAL  Final   Organism ID, Bacteria STAPHYLOCOCCUS AUREUS  Final      Susceptibility   Staphylococcus aureus - MIC*    CLINDAMYCIN >=8 RESISTANT Resistant     ERYTHROMYCIN >=8 RESISTANT Resistant     GENTAMICIN <=0.5 SENSITIVE Sensitive     LEVOFLOXACIN 0.25 SENSITIVE Sensitive     OXACILLIN 1 SENSITIVE Sensitive     PENICILLIN >=0.5 RESISTANT Resistant     RIFAMPIN <=0.5 SENSITIVE Sensitive     TRIMETH/SULFA <=10 SENSITIVE Sensitive     VANCOMYCIN 1 SENSITIVE Sensitive     TETRACYCLINE >=16 RESISTANT Resistant     MOXIFLOXACIN <=0.25 SENSITIVE Sensitive     * FEW STAPHYLOCOCCUS AUREUS  Clostridium Difficile by PCR     Status: None   Collection Time: 09/21/14 10:10 PM  Result Value Ref Range Status     C difficile by pcr NEGATIVE NEGATIVE Final  Blood Culture (routine x 2)     Status: None (Preliminary result)   Collection Time: 09/29/14 12:16 PM  Result Value Ref Range Status   Specimen Description BLOOD LEFT HAND  Final   Special Requests BOTTLES DRAWN AEROBIC AND ANAEROBIC 5CC  Final   Culture  Setup Time   Final    09/29/2014 18:08 Performed at [facility]    Culture   Final           BLOOD CULTURE RECEIVED NO GROWTH TO DATE CULTURE WILL BE HELD FOR 5 DAYS BEFORE ISSUING A FINAL NEGATIVE REPORT Performed at [facility]    Report Status PENDING  Incomplete  Blood Culture (routine x 2)     Status: None (Preliminary result)   Collection Time: 09/29/14  1:20 PM  Result Value Ref Range Status   Specimen Description BLOOD RIGHT ANTECUBITAL  Final   Special Requests BOTTLES DRAWN AEROBIC AND ANAEROBIC 5ML  Final   Culture  Setup Time   Final    09/29/2014 18:07 Performed at [facility]    Culture   Final           BLOOD CULTURE RECEIVED NO GROWTH TO DATE CULTURE WILL BE HELD FOR 5 DAYS BEFORE ISSUING A FINAL NEGATIVE REPORT Performed at [facility]    Report Status PENDING  Incomplete  Urine culture     Status: None  Collection Time: 09/29/14  6:28 PM  Result Value Ref Range Status   Specimen Description URINE, CLEAN CATCH  Final   Special Requests NONE  Final   Culture  Setup Time   Final    09/30/2014 02:39 Performed at [place] Performed at [facility]   Final   Culture NO GROWTH Performed at [facility]   Final   Report Status 09/30/2014 FINAL  Final    Studies/Results: No results found.    Assessment/Plan:  Active Problems:   GERD   Protein-calorie malnutrition, severe   IDDM (insulin dependent diabetes mellitus)   Spinal cord infarction (history of)   Sepsis   UTI (lower urinary tract infection)   Osteomyelitis, pelvis   Cellulitis and abscess of leg,  except foot    Jason [name] is a 28 y.o. male with  Admission for MSSA bacteremia In November. 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 He did not undergo debridement but was followed closely General surgery and had some purulent material drained spontaneous once the eschar was removed while he was receiving hydrotherapy. There also been concern for possible infection of his feet. He was supposed to receive 6-8 weeks of IV antibiotics but only received 4 weeks. He was again admitted in December to the ICU and MRI done during that admission of pelvis which had shown an enlarging multiloculated fluid collection around his ischial his tuberosity. He was seen again by general surgery but they felt that he did not need debridement of this site. Here MRI performed of his left foot which had suggested an abscess near his calcaneus. I do not see that we were reconsulted at that time. Ultimately was discharged home on the 18th apparently without any antibiotics at all. He has been readmitted only 5 days later with a septic picture.  #1 Sepsis: the OBVIOUS PRIME SUSPECT is his ischial area which per mother is not looking to be progressing well. Radiology described a multiloculated abscess in this area although general surgery seem to have been less impressed at the bedside.  The wound is certainly MUCH larger than when I saw him in November and clearly goes to bone which is palpable in the bed  I suspect this source is what seeded his blood with Streptococcus aureus.   -- I will check an  MRI with contrast of pelvis today --I suspect he will need debridement by general or perhaps orthopedics given that he has osteomyelitis   LOS: 3 days   [name] 10/02/2014, 5:05 PM

## 2014-10-03 DIAGNOSIS — A4101 Sepsis due to Methicillin susceptible Staphylococcus aureus: Secondary | ICD-10-CM | POA: Insufficient documentation

## 2014-10-03 LAB — CBC
HEMATOCRIT: 28.9 % — AB (ref 39.0–52.0)
HEMOGLOBIN: 9.4 g/dL — AB (ref 13.0–17.0)
MCH: 26.4 pg (ref 26.0–34.0)
MCHC: 32.5 g/dL (ref 30.0–36.0)
MCV: 81.2 fL (ref 78.0–100.0)
Platelets: 597 10*3/uL — ABNORMAL HIGH (ref 150–400)
RBC: 3.56 MIL/uL — ABNORMAL LOW (ref 4.22–5.81)
RDW: 15.3 % (ref 11.5–15.5)
WBC: 13.7 10*3/uL — AB (ref 4.0–10.5)

## 2014-10-03 LAB — GLUCOSE, CAPILLARY
GLUCOSE-CAPILLARY: 291 mg/dL — AB (ref 70–99)
GLUCOSE-CAPILLARY: 217 mg/dL — AB (ref 70–99)
Glucose-Capillary: 237 mg/dL — ABNORMAL HIGH (ref 70–99)
Glucose-Capillary: 225 mg/dL — ABNORMAL HIGH (ref 70–99)

## 2014-10-03 MED ORDER — SODIUM CHLORIDE 0.45 % IV SOLN
INTRAVENOUS | Status: DC
  Administered 2014-10-03 – 2014-10-05 (×3): via INTRAVENOUS
  Filled 2014-10-03 (×5): qty 1000

## 2014-10-03 NOTE — Progress Notes (Addendum)
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 Infectious disease consulting.   Assessment/Plan:  1. Sepsis/fever: Exact source not known but ID is suspecting more from right ischial tuberosity decubitus ulcer with underlying osteomyelitis. Foley catheter changed this admission. Blood cultures 2 negative to date. Persistent left lower lobe consolidation on chest x-ray but no respiratory symptoms. Recently treated for MSSA bacteremia. Recent TEE was negative. Wound care consultation appreciated. Continue IV vancomycin. Aztreonam seems to have been discontinued by ID on 12/26. No fevers. Pelvic MRI results appreciated. Discussed with ID who recommends general surgery consultation for possible debridement. 2. Acute encephalopathy: Secondary to problem #1. Resolved. No focal deficits. 3. Acute kidney injury: Related to sepsis and dehydration. Resolved. 4. Type II DM/IDDM: Continue Lantus and SSI and adjust as needed. Fluctuating inpatient control. 5. History of severe protein calorie malnutrition: Continue nutritional supplements. 6. Sacral decubitus ulcer/pelvic OM: Wound care consultation on 12/24 appreciated. Patient has multiple sacral/buttock stage III ulcers, right ischial stage IV ulcer, left foot with several full-thickness wounds, fifth toe and fourth toe wound. Management per wound care team. General surgery consulted to evaluate right ischial tuberosity wound. 7. History of GERD: Continue PPI. 8. History of spinal cord infarction and quadriparesis: Paraplegic in lower  extremities and weak in the upper extremities. 9. Dehydration with hyponatremia: resolved. DC IVF 10. Leukocytosis: Secondary to problem #1. Follow CBCs. Improving. 11. History of recently treated MSSA bacteremia: Continue above antibiotics and follow blood culture results. 12. Hypertension: Soft blood pressures. Hold antihypertensives. 13. Chronic benzodiazepine use: Continue same. 14. Chronic Foley catheter and traumatic hypospadias: Patient states that urology had consulted in the past and recommended suprapubic catheter but he was not medically stable to get it at that time. Discussed with urologist on call Dr. Era Bumpers on 12/24 would recommended looping the Foley catheter and taping it to abdomen to reduce gravity related further trauma to penis and decreasing the chance of urethral stricture. He also recommended Neosporin ointment or the junction of the Foley catheter in the penis and outpatient follow-up with urology to evaluate whether he would benefit from a suprapubic catheter. 15. Acute on chronic Anemia: Likely secondary to acute illness and dilutional. No reported bleeding. Stable   Code Status:  Full Family Communication:  None at bedside. Disposition Plan:  Not medically stable for discharge.   Consultants:  Infectious disease  General surgery  Procedures:   Changed Foley catheter  Antibiotics:   IV vancomycin and 12/23 >  IV aztreonam 12/23 > 12/26  Subjective: Denies complaints.  Objective: Filed Vitals:   10/02/14 1603 10/02/14 1841 10/02/14 2131 10/03/14 0525  BP: 100/65 116/76 110/73 103/62  Pulse: 70 70 72 80  Temp: 97.8 F (36.6 C) 98 F (36.7 C) 98.8 F (37.1 C) 100.3 F (37.9 C)  TempSrc: Oral Oral Oral Oral  Resp: 16 17 18 18   Height:      Weight:  57 kg (125 lb 10.6 oz)    SpO2: 100% 97% 100% 100%    Intake/Output Summary (Last 24 hours) at 10/03/14 1354 Last data filed at 10/03/14 0525  Gross per 24 hour  Intake  3 ml  Output    1025 ml  Net  -1022 ml   Filed Weights   10/01/14 0408 10/02/14 0455 10/02/14 1841  Weight: 55.4 kg (122 lb 2.2 oz) 55.3 kg (121 lb 14.6 oz) 57 kg (125 lb 10.6 oz)     Exam:  General exam:  Moderately built and thinly nourished chronically ill-looking young male lying comfortably in bed. Respiratory system: Clear. No increased work of breathing. Cardiovascular system: S1 & S2 heard, RRR. No JVD, murmurs, gallops, clicks or pedal edema. Gastrointestinal system: Abdomen is nondistended, soft and nontender. Normal bowel sounds heard. Central nervous system: Alert and oriented. No focal neurological deficits. Extremities:  Grade 0 x 5 power in lower extremities and grade 3 x 5 power in upper extremities proximally and 0 x 5 power distally with contractures of fingers. Skin: Multiple wounds > evaluation as per [facility] notes 12/24   Data Reviewed: Basic Metabolic Panel:  Recent Labs Lab 09/29/14 1221 09/29/14 1916 09/30/14 0411 10/01/14 0345 10/02/14 0334  NA 131*  --  137 131* 133*  K 4.6  --  4.0 4.2 4.0  CL 104  --  110 106 105  CO2 17*  --  20 20 22   GLUCOSE 338*  --  132* 221* 173*  BUN 39*  --  24* 11 10  CREATININE 1.47*  --  0.80 0.65 0.60  CALCIUM 9.2  --  9.6 9.1 9.4  MG  --  1.5  --   --   --   PHOS  --  3.2  --   --   --    Liver Function Tests:  Recent Labs Lab 09/29/14 1221  AST 11  ALT 11  ALKPHOS 76  BILITOT 0.5  PROT 7.7  ALBUMIN 2.5*   No results for input(s): LIPASE, AMYLASE in the last 168 hours. No results for input(s): AMMONIA in the last 168 hours. CBC:  Recent Labs Lab 09/29/14 1221 09/30/14 0411 10/01/14 0345 10/02/14 0334 10/03/14 0717  WBC 27.2* 25.6* 16.5* 15.2* 13.7*  NEUTROABS 20.1*  --   --   --   --   HGB 9.6* 9.5* 8.9* 8.5* 9.4*  HCT 29.7* 29.2* 27.7* 26.5* 28.9*  MCV 83.9 82.5 82.2 81.8 81.2  PLT 477* 496* 479* 567* 597*   Cardiac Enzymes: No results for input(s): CKTOTAL, CKMB, CKMBINDEX, TROPONINI in the last  168 hours. BNP (last 3 results)  Recent Labs  01/29/14 2113  PROBNP 1343.0*   CBG:  Recent Labs Lab 10/02/14 1226 10/02/14 1612 10/02/14 2128 10/03/14 0816 10/03/14 1155  GLUCAP 151* 73 151* 237* 291*    Recent Results (from the past 240 hour(s))  Blood Culture (routine x 2)     Status: None (Preliminary result)   Collection Time: 09/29/14 12:16 PM  Result Value Ref Range Status   Specimen Description BLOOD LEFT HAND  Final   Special Requests BOTTLES DRAWN AEROBIC AND ANAEROBIC 5CC  Final   Culture  Setup Time   Final    09/29/2014 18:08 Performed at [facility]    Culture   Final           BLOOD CULTURE RECEIVED NO GROWTH TO DATE CULTURE WILL BE HELD FOR 5 DAYS BEFORE ISSUING A FINAL NEGATIVE REPORT Performed at [facility]    Report Status PENDING  Incomplete  Blood Culture (routine x 2)     Status: None (Preliminary result)   Collection Time: 09/29/14  1:20 PM  Result  Value Ref Range Status   Specimen Description BLOOD RIGHT ANTECUBITAL  Final   Special Requests BOTTLES DRAWN AEROBIC AND ANAEROBIC 5ML  Final   Culture  Setup Time   Final    09/29/2014 18:07 Performed at [facility]    Culture   Final           BLOOD CULTURE RECEIVED NO GROWTH TO DATE CULTURE WILL BE HELD FOR 5 DAYS BEFORE ISSUING A FINAL NEGATIVE REPORT Performed at [facility]    Report Status PENDING  Incomplete  Urine culture     Status: None   Collection Time: 09/29/14  6:28 PM  Result Value Ref Range Status   Specimen Description URINE, CLEAN CATCH  Final   Special Requests NONE  Final   Culture  Setup Time   Final    09/30/2014 02:39 Performed at [place] Performed at [facility]   Final   Culture NO GROWTH Performed at [facility]   Final   Report Status 09/30/2014 FINAL  Final         Studies: Mr Pelvis W Wo Contrast  10/03/2014   CLINICAL DATA:  Quadriplegic patient with  methicillin sensitive bacteremia and decubitus ulcers over the ischial tuberosities.  EXAM: MRI PELVIS WITHOUT AND WITH CONTRAST  TECHNIQUE: Multiplanar multisequence MR imaging of the pelvis was performed both before and after administration of intravenous contrast.  CONTRAST:  12 mL MULTIHANCE GADOBENATE DIMEGLUMINE 529 MG/ML IV SOLN  COMPARISON:  MRI of the pelvis with and without contrast 09/16/2014.  FINDINGS: As on the prior study, the patient has decubitus ulcers over the ischial tuberosities, larger on the right. The decubitus ulcer on the right extends to bone. There is marrow edema and enhancement in these ischial tuberosity and right inferior pubic ramus consistent with osteomyelitis. The extent of marrow signal change appears slightly increased compared to the prior exam. There is likely cortical destructive change in the ischial tuberosity. No rim enhancing fluid collection is seen in this location although there is intense underlying edema and enhancement in the gluteal musculature consistent with myositis. A rind of soft tissue about the wound does not enhance consistent with necrosis.  Marrow edema in the left ischial tuberosity is normal without evidence of osteomyelitis. As seen on the right, there is an edema and enhancement in the gluteal musculature about the wound consistent with myositis. A rind of soft tissue about the wound which does not enhance compatible with necrosis.  Diffuse subcutaneous edema is seen about the pelvis. There is no hip joint effusion. No evidence of bursitis is identified.  IMPRESSION: Large decubitus ulcer over the right distal tuberosity with edema and enhancement in the right inferior pubic ramus consistent with osteomyelitis. No abscess is identified although a rind of necrotic tissue surrounds the wound. Edema and enhancement in the gluteal musculature surrounding the wound is consistent with myositis  Smaller decubitus ulcer over the left distal tuberosity without  underlying abscess or osteomyelitis. Rind of nonenhancing tissue in the wound is consistent with necrosis.   Electronically Signed   By: Inge Rise M.D.   On: 10/03/2014 13:07        Scheduled Meds: . antiseptic oral rinse  7 mL Mouth Rinse BID  . atorvastatin  20 mg Oral Daily  . bacitracin   Topical BID  . collagenase   Topical Daily  . feeding supplement (GLUCERNA SHAKE)  237 mL Oral TID BM  .  heparin  5,000 Units Subcutaneous 3 times per day  . insulin aspart  0-5 Units Subcutaneous QHS  . insulin aspart  0-9 Units Subcutaneous TID WC  . insulin aspart  4 Units Subcutaneous TID WC  . insulin glargine  25 Units Subcutaneous QHS  . loratadine  10 mg Oral Daily  . metoCLOPramide  5 mg Oral Once per day on Mon Thu  . mupirocin cream   Topical BID  . pantoprazole  40 mg Oral Daily  . polyethylene glycol  17 g Oral Daily  . pregabalin  100 mg Oral QID  . sodium chloride  3 mL Intravenous Q12H  . vancomycin  750 mg Intravenous Q12H   Continuous Infusions:    Active Problems:   GERD   Protein-calorie malnutrition, severe   IDDM (insulin dependent diabetes mellitus)   Spinal cord infarction (history of)   Sepsis   UTI (lower urinary tract infection)   Osteomyelitis, pelvis   Cellulitis and abscess of leg, except foot   Abscess   Staphylococcus aureus bacteremia with sepsis    Time spent: 30 minutes.    Vernell Leep, MD, FACP, FHM. [facility] Pager [phone]  If 7PM-7AM, please contact night-coverage [email or web address] Password [email or web address] 10/03/2014, 1:54 PM    LOS: 4 days

## 2014-10-03 NOTE — Progress Notes (Signed)
Patient ID: Jason [name], male   DOB: [birth year], 28 y.o.   MRN: [id number]   LOS: 4 days   Subjective: Readmitted for sepsis, w/u negative for source except for MR showing osteo in right pelvic ring.   Objective: Vital signs in last 24 hours: Temp:  [98 F (36.7 C)-100.3 F (37.9 C)] 100.3 F (37.9 C) (12/27 0525) Pulse Rate:  [70-80] 80 (12/27 0525) Resp:  [17-18] 18 (12/27 0525) BP: (103-116)/(62-76) 103/62 mmHg (12/27 0525) SpO2:  [97 %-100 %] 100 % (12/27 0525) Weight:  [125 lb 10.6 oz (57 kg)] 125 lb 10.6 oz (57 kg) (12/26 1841) Last BM Date: 10/01/14   Laboratory  CBC  Recent Labs  10/02/14 0334 10/03/14 0717  WBC 15.2* 13.7*  HGB 8.5* 9.4*  HCT 26.5* 28.9*  PLT 567* 597*      Assessment/Plan: Decubitus ulcers -- I suspect the right ulcer needs debridement. Will prep pt for OR tomorrow, ultimate decision to be made by DOW MD in the am.    Lisette Abu, PA-C Pager: [phone] 10/03/2014

## 2014-10-03 NOTE — Progress Notes (Signed)
[place] for Infectious [facility]         Day # 5  vancomycin  Also received 3 days of aztreonam  Subjective: No new complaints   Antibiotics:  Anti-infectives    Start     Dose/Rate Route Frequency Ordered Stop   09/30/14 1400  vancomycin (VANCOCIN) IVPB 1000 mg/200 mL premix  Status:  Discontinued     1,000 mg200 mL/hr over 60 Minutes Intravenous Every 24 hours 09/29/14 1326 09/29/14 1848   09/30/14 1400  vancomycin (VANCOCIN) IVPB 1000 mg/200 mL premix  Status:  Discontinued     1,000 mg200 mL/hr over 60 Minutes Intravenous Every 24 hours 09/29/14 1916 09/30/14 0845   09/30/14 1000  vancomycin (VANCOCIN) IVPB 750 mg/150 ml premix     750 mg150 mL/hr over 60 Minutes Intravenous Every 12 hours 09/30/14 0845     09/29/14 2200  aztreonam (AZACTAM) 1 g in dextrose 5 % 50 mL IVPB  Status:  Discontinued     1 g100 mL/hr over 30 Minutes Intravenous 3 times per day 09/29/14 1326 09/29/14 1848   09/29/14 2200  aztreonam (AZACTAM) 1 g in dextrose 5 % 50 mL IVPB  Status:  Discontinued     1 g100 mL/hr over 30 Minutes Intravenous 3 times per day 09/29/14 1916 10/01/14 1426   09/29/14 1300  aztreonam (AZACTAM) 2 g in dextrose 5 % 50 mL IVPB     2 g100 mL/hr over 30 Minutes Intravenous STAT 09/29/14 1239 09/29/14 1355   09/29/14 1245  vancomycin (VANCOCIN) IVPB 1000 mg/200 mL premix     1,000 mg200 mL/hr over 60 Minutes Intravenous  Once 09/29/14 1239 09/29/14 1530      Medications: Scheduled Meds: . antiseptic oral rinse  7 mL Mouth Rinse BID  . atorvastatin  20 mg Oral Daily  . bacitracin   Topical BID  . collagenase   Topical Daily  . feeding supplement (GLUCERNA SHAKE)  237 mL Oral TID BM  . heparin  5,000 Units Subcutaneous 3 times per day  . insulin aspart  0-5 Units Subcutaneous QHS  . insulin aspart  0-9 Units Subcutaneous TID WC  . insulin aspart  4 Units Subcutaneous TID WC  . insulin glargine  25 Units Subcutaneous QHS  . loratadine  10 mg Oral Daily  .  metoCLOPramide  5 mg Oral Once per day on Mon Thu  . mupirocin cream   Topical BID  . pantoprazole  40 mg Oral Daily  . polyethylene glycol  17 g Oral Daily  . pregabalin  100 mg Oral QID  . sodium chloride  3 mL Intravenous Q12H  . vancomycin  750 mg Intravenous Q12H   Continuous Infusions:  PRN Meds:.acetaminophen **OR** acetaminophen, albuterol, ALPRAZolam, methocarbamol, morphine injection, ondansetron, oxyCODONE    Objective: Weight change: 3 lb 12 oz (1.7 kg)  Intake/Output Summary (Last 24 hours) at 10/03/14 1331 Last data filed at 10/03/14 0525  Gross per 24 hour  Intake      3 ml  Output   1025 ml  Net  -1022 ml   Blood pressure 103/62, pulse 80, temperature 100.3 F (37.9 C), temperature source Oral, resp. rate 18, height 5\' 8"  (1.727 m), weight 125 lb 10.6 oz (57 kg), SpO2 100 %. Temp:  [97.8 F (36.6 C)-100.3 F (37.9 C)] 100.3 F (37.9 C) (12/27 0525) Pulse Rate:  [70-80] 80 (12/27 0525) Resp:  [16-18] 18 (12/27 0525) BP: (100-116)/(62-76) 103/62 mmHg (12/27 0525) SpO2:  [97 %-100 %]  100 % (12/27 0525) Weight:  [125 lb 10.6 oz (57 kg)] 125 lb 10.6 oz (57 kg) (12/26 1841)  Physical Exam: General: Alert and awake, oriented x3, not in any acute distress. HEENT: anicteric sclera, p, EOMI CVS regular rate, normal  Chest:no wheezing,no respiratory distress Abdomen: soft  nondistended,  Extremities:  Did not examine today  Sacral decubitus ulcers past pictures and yesterday  08/12/2014:       todays picture sacrum 10/02/14:         Left foot 10/01/2014:       Neuro: paraplegic  CBC:  CBC Latest Ref Rng 10/03/2014 10/02/2014 10/01/2014  WBC 4.0 - 10.5 K/uL 13.7(H) 15.2(H) 16.5(H)  Hemoglobin 13.0 - 17.0 g/dL 9.4(L) 8.5(L) 8.9(L)  Hematocrit 39.0 - 52.0 % 28.9(L) 26.5(L) 27.7(L)  Platelets 150 - 400 K/uL 597(H) 567(H) 479(H)      BMET  Recent Labs  10/01/14 0345 10/02/14 0334  NA 131* 133*  K 4.2 4.0  CL 106 105  CO2 20  22  GLUCOSE 221* 173*  BUN 11 10  CREATININE 0.65 0.60  CALCIUM 9.1 9.4     Liver Panel  No results for input(s): PROT, ALBUMIN, AST, ALT, ALKPHOS, BILITOT, BILIDIR, IBILI in the last 72 hours.     Sedimentation Rate No results for input(s): ESRSEDRATE in the last 72 hours. C-Reactive Protein No results for input(s): CRP in the last 72 hours.  Micro Results: Recent Results (from the past 720 hour(s))  Blood Culture (routine x 2)     Status: None   Collection Time: 09/16/14  3:25 AM  Result Value Ref Range Status   Specimen Description BLOOD RIGHT ARM  Final   Special Requests BOTTLES DRAWN AEROBIC AND ANAEROBIC 10CC  Final   Culture  Setup Time   Final    09/16/2014 08:58 Performed at [place]   Final    NO GROWTH 5 DAYS Performed at [facility]    Report Status 09/22/2014 FINAL  Final  Blood Culture (routine x 2)     Status: None   Collection Time: 09/16/14  3:30 AM  Result Value Ref Range Status   Specimen Description BLOOD LEFT ARM  Final   Special Requests BOTTLES DRAWN AEROBIC ONLY 10CC  Final   Culture  Setup Time   Final    09/16/2014 08:58 Performed at [place]   Final    NO GROWTH 5 DAYS Performed at [facility]    Report Status 09/22/2014 FINAL  Final  Urine culture     Status: None   Collection Time: 09/16/14  4:09 AM  Result Value Ref Range Status   Specimen Description URINE, RANDOM  Final   Special Requests NONE  Final   Culture  Setup Time   Final    09/16/2014 08:45 Performed at [place]   Final    >=100,000 COLONIES/ML Performed at [place] Performed at [facility]    Report Status 09/18/2014 FINAL  Final   Organism ID, Bacteria KLEBSIELLA SPECIES  Final      Susceptibility   Klebsiella species - MIC*    AMPICILLIN >=32 RESISTANT Resistant     CEFAZOLIN <=4 SENSITIVE Sensitive      CEFTRIAXONE <=1 SENSITIVE Sensitive     CIPROFLOXACIN <=0.25 SENSITIVE Sensitive     GENTAMICIN <=1  SENSITIVE Sensitive     NITROFURANTOIN <=16 SENSITIVE Sensitive     TOBRAMYCIN <=1 SENSITIVE Sensitive     TRIMETH/SULFA <=20 SENSITIVE Sensitive     PIP/TAZO 8 SENSITIVE Sensitive     * KLEBSIELLA SPECIES  MRSA PCR Screening     Status: None   Collection Time: 09/16/14  7:08 AM  Result Value Ref Range Status   MRSA by PCR NEGATIVE NEGATIVE Final    Comment:        The GeneXpert MRSA Assay (FDA approved for NASAL specimens only), is one component of a comprehensive MRSA colonization surveillance program. It is not intended to diagnose MRSA infection nor to guide or monitor treatment for MRSA infections.   Culture, respiratory (tracheal aspirate)     Status: None   Collection Time: 09/17/14  6:19 AM  Result Value Ref Range Status   Specimen Description TRACHEAL ASPIRATE  Final   Special Requests NONE  Final   Gram Stain   Final    FEW WBC PRESENT,BOTH PMN AND MONONUCLEAR RARE SQUAMOUS EPITHELIAL CELLS PRESENT NO ORGANISMS SEEN Performed at [facility]    Culture   Final    FEW STAPHYLOCOCCUS AUREUS Note: RIFAMPIN AND GENTAMICIN SHOULD NOT BE USED AS SINGLE DRUGS FOR TREATMENT OF STAPH INFECTIONS. Performed at [facility]    Report Status 09/20/2014 FINAL  Final   Organism ID, Bacteria STAPHYLOCOCCUS AUREUS  Final      Susceptibility   Staphylococcus aureus - MIC*    CLINDAMYCIN >=8 RESISTANT Resistant     ERYTHROMYCIN >=8 RESISTANT Resistant     GENTAMICIN <=0.5 SENSITIVE Sensitive     LEVOFLOXACIN 0.25 SENSITIVE Sensitive     OXACILLIN 1 SENSITIVE Sensitive     PENICILLIN >=0.5 RESISTANT Resistant     RIFAMPIN <=0.5 SENSITIVE Sensitive     TRIMETH/SULFA <=10 SENSITIVE Sensitive     VANCOMYCIN 1 SENSITIVE Sensitive     TETRACYCLINE >=16 RESISTANT Resistant     MOXIFLOXACIN <=0.25 SENSITIVE Sensitive     * FEW STAPHYLOCOCCUS AUREUS  Clostridium  Difficile by PCR     Status: None   Collection Time: 09/21/14 10:10 PM  Result Value Ref Range Status   C difficile by pcr NEGATIVE NEGATIVE Final  Blood Culture (routine x 2)     Status: None (Preliminary result)   Collection Time: 09/29/14 12:16 PM  Result Value Ref Range Status   Specimen Description BLOOD LEFT HAND  Final   Special Requests BOTTLES DRAWN AEROBIC AND ANAEROBIC 5CC  Final   Culture  Setup Time   Final    09/29/2014 18:08 Performed at [facility]    Culture   Final           BLOOD CULTURE RECEIVED NO GROWTH TO DATE CULTURE WILL BE HELD FOR 5 DAYS BEFORE ISSUING A FINAL NEGATIVE REPORT Performed at [facility]    Report Status PENDING  Incomplete  Blood Culture (routine x 2)     Status: None (Preliminary result)   Collection Time: 09/29/14  1:20 PM  Result Value Ref Range Status   Specimen Description BLOOD RIGHT ANTECUBITAL  Final   Special Requests BOTTLES DRAWN AEROBIC AND ANAEROBIC 5ML  Final   Culture  Setup Time   Final    09/29/2014 18:07 Performed at [facility]    Culture   Final           BLOOD CULTURE RECEIVED NO GROWTH TO DATE CULTURE WILL BE HELD FOR 5 DAYS  BEFORE ISSUING A FINAL NEGATIVE REPORT Performed at [facility]    Report Status PENDING  Incomplete  Urine culture     Status: None   Collection Time: 09/29/14  6:28 PM  Result Value Ref Range Status   Specimen Description URINE, CLEAN CATCH  Final   Special Requests NONE  Final   Culture  Setup Time   Final    09/30/2014 02:39 Performed at [place] Performed at [facility]   Final   Culture NO GROWTH Performed at [facility]   Final   Report Status 09/30/2014 FINAL  Final    Studies/Results: Mr Pelvis W Wo Contrast  10/03/2014   CLINICAL DATA:  Quadriplegic patient with methicillin sensitive bacteremia and decubitus ulcers over the ischial tuberosities.  EXAM: MRI PELVIS WITHOUT AND WITH  CONTRAST  TECHNIQUE: Multiplanar multisequence MR imaging of the pelvis was performed both before and after administration of intravenous contrast.  CONTRAST:  12 mL MULTIHANCE GADOBENATE DIMEGLUMINE 529 MG/ML IV SOLN  COMPARISON:  MRI of the pelvis with and without contrast 09/16/2014.  FINDINGS: As on the prior study, the patient has decubitus ulcers over the ischial tuberosities, larger on the right. The decubitus ulcer on the right extends to bone. There is marrow edema and enhancement in these ischial tuberosity and right inferior pubic ramus consistent with osteomyelitis. The extent of marrow signal change appears slightly increased compared to the prior exam. There is likely cortical destructive change in the ischial tuberosity. No rim enhancing fluid collection is seen in this location although there is intense underlying edema and enhancement in the gluteal musculature consistent with myositis. A rind of soft tissue about the wound does not enhance consistent with necrosis.  Marrow edema in the left ischial tuberosity is normal without evidence of osteomyelitis. As seen on the right, there is an edema and enhancement in the gluteal musculature about the wound consistent with myositis. A rind of soft tissue about the wound which does not enhance compatible with necrosis.  Diffuse subcutaneous edema is seen about the pelvis. There is no hip joint effusion. No evidence of bursitis is identified.  IMPRESSION: Large decubitus ulcer over the right distal tuberosity with edema and enhancement in the right inferior pubic ramus consistent with osteomyelitis. No abscess is identified although a rind of necrotic tissue surrounds the wound. Edema and enhancement in the gluteal musculature surrounding the wound is consistent with myositis  Smaller decubitus ulcer over the left distal tuberosity without underlying abscess or osteomyelitis. Rind of nonenhancing tissue in the wound is consistent with necrosis.    Electronically Signed   By: Inge Rise M.D.   On: 10/03/2014 13:07      Assessment/Plan:  Active Problems:   GERD   Protein-calorie malnutrition, severe   IDDM (insulin dependent diabetes mellitus)   Spinal cord infarction (history of)   Sepsis   UTI (lower urinary tract infection)   Osteomyelitis, pelvis   Cellulitis and abscess of leg, except foot   Abscess    Jason [name] is a 28 y.o. male with  Admission for MSSA bacteremia In November. 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 He did not undergo debridement but was followed closely General surgery and had some purulent material drained spontaneous once the eschar was removed while he was receiving hydrotherapy. There also been concern for possible infection of his feet. He was  supposed to receive 6-8 weeks of IV antibiotics but only received 4 weeks. He was again admitted in December to the ICU and MRI done during that admission of pelvis which had shown an enlarging multiloculated fluid collection around his ischial his tuberosity. He was seen again by general surgery but they felt that he did not need debridement of this site. Here MRI performed of his left foot which had suggested an abscess near his calcaneus. I do not see that we were reconsulted at that time. Ultimately was discharged home on the 18th apparently without any antibiotics at all. He has been readmitted only 5 days later with a septic picture. MRI done yesterday shows osteomyelitis with increased edema in ischium and pubic ramus vs prior study. No abscess but edema and enhancement in surrounding tissue c/w myositis  #1 Sepsis: the OBVIOUS PRIME SUSPECT is his ischial area I suspect this source is what seeded his blood with Streptococcus aureus. Imaging shows worsening of marrow edema in these areas  --Patient has been followed by [facility] in the past but I would also consider touching base with orthopedics and or  plastics  Re possible further debridement and operative options if there are any --I would be inclined to retreathim with IV vancomycin for another 6-8 weeks post a debridement    LOS: 4 days   [name] 10/03/2014, 1:31 PM

## 2014-10-04 ENCOUNTER — Telehealth: Payer: Self-pay | Admitting: Family Medicine

## 2014-10-04 ENCOUNTER — Encounter (HOSPITAL_COMMUNITY): Payer: Self-pay | Admitting: Certified Registered Nurse Anesthetist

## 2014-10-04 ENCOUNTER — Inpatient Hospital Stay (HOSPITAL_COMMUNITY): Payer: Medicaid Other | Admitting: Anesthesiology

## 2014-10-04 ENCOUNTER — Telehealth: Payer: Self-pay | Admitting: *Deleted

## 2014-10-04 ENCOUNTER — Encounter (HOSPITAL_COMMUNITY)
Admission: EM | Disposition: A | Discharge status: Home Health | Payer: Self-pay | Source: Home / Self Care | Attending: Internal Medicine

## 2014-10-04 DIAGNOSIS — B9561 Methicillin susceptible Staphylococcus aureus infection as the cause of diseases classified elsewhere: Secondary | ICD-10-CM

## 2014-10-04 HISTORY — PX: DEBRIDMENT OF DECUBITUS ULCER: SHX6276

## 2014-10-04 LAB — GLUCOSE, CAPILLARY
GLUCOSE-CAPILLARY: 111 mg/dL — AB (ref 70–99)
GLUCOSE-CAPILLARY: 124 mg/dL — AB (ref 70–99)
GLUCOSE-CAPILLARY: 144 mg/dL — AB (ref 70–99)
Glucose-Capillary: 131 mg/dL — ABNORMAL HIGH (ref 70–99)
Glucose-Capillary: 181 mg/dL — ABNORMAL HIGH (ref 70–99)
Glucose-Capillary: 300 mg/dL — ABNORMAL HIGH (ref 70–99)

## 2014-10-04 LAB — MRSA PCR SCREENING: MRSA BY PCR: NEGATIVE

## 2014-10-04 SURGERY — DEBRIDMENT OF DECUBITUS ULCER
Anesthesia: General | Site: Buttocks

## 2014-10-04 MED ORDER — ONDANSETRON HCL 4 MG/2ML IJ SOLN
INTRAMUSCULAR | Status: AC
  Filled 2014-10-04: qty 2

## 2014-10-04 MED ORDER — MIDAZOLAM HCL 2 MG/2ML IJ SOLN
INTRAMUSCULAR | Status: AC
  Filled 2014-10-04: qty 2

## 2014-10-04 MED ORDER — ROCURONIUM BROMIDE 100 MG/10ML IV SOLN
INTRAVENOUS | Status: DC | PRN
  Administered 2014-10-04: 30 mg via INTRAVENOUS

## 2014-10-04 MED ORDER — GLYCOPYRROLATE 0.2 MG/ML IJ SOLN
INTRAMUSCULAR | Status: DC | PRN
  Administered 2014-10-04: .7 mg via INTRAVENOUS

## 2014-10-04 MED ORDER — PROPOFOL 10 MG/ML IV BOLUS
INTRAVENOUS | Status: AC
  Filled 2014-10-04: qty 20

## 2014-10-04 MED ORDER — PHENYLEPHRINE HCL 10 MG/ML IJ SOLN
INTRAMUSCULAR | Status: DC | PRN
  Administered 2014-10-04: 40 ug via INTRAVENOUS

## 2014-10-04 MED ORDER — FENTANYL CITRATE 0.05 MG/ML IJ SOLN
INTRAMUSCULAR | Status: DC | PRN
  Administered 2014-10-04: 100 ug via INTRAVENOUS
  Administered 2014-10-04: 50 ug via INTRAVENOUS

## 2014-10-04 MED ORDER — FENTANYL CITRATE 0.05 MG/ML IJ SOLN
25.0000 ug | INTRAMUSCULAR | Status: DC | PRN
  Administered 2014-10-04 (×2): 50 ug via INTRAVENOUS

## 2014-10-04 MED ORDER — FENTANYL CITRATE 0.05 MG/ML IJ SOLN
INTRAMUSCULAR | Status: AC
  Filled 2014-10-04: qty 2

## 2014-10-04 MED ORDER — PROPOFOL 10 MG/ML IV BOLUS
INTRAVENOUS | Status: DC | PRN
  Administered 2014-10-04: 130 mg via INTRAVENOUS

## 2014-10-04 MED ORDER — FENTANYL CITRATE 0.05 MG/ML IJ SOLN: 25.0000 ug | INTRAMUSCULAR | Status: DC | PRN

## 2014-10-04 MED ORDER — NEOSTIGMINE METHYLSULFATE 10 MG/10ML IV SOLN
INTRAVENOUS | Status: AC
  Filled 2014-10-04: qty 1

## 2014-10-04 MED ORDER — FENTANYL CITRATE 0.05 MG/ML IJ SOLN
INTRAMUSCULAR | Status: AC
  Filled 2014-10-04: qty 5

## 2014-10-04 MED ORDER — LACTATED RINGERS IV SOLN
INTRAVENOUS | Status: DC | PRN
Start: 2014-10-04 — End: 2014-10-04
  Administered 2014-10-04: 13:00:00 via INTRAVENOUS

## 2014-10-04 MED ORDER — 0.9 % SODIUM CHLORIDE (POUR BTL) OPTIME
TOPICAL | Status: DC | PRN
  Administered 2014-10-04: 1000 mL

## 2014-10-04 MED ORDER — PROMETHAZINE HCL 25 MG/ML IJ SOLN: 6.2500 mg | INTRAMUSCULAR | Status: DC | PRN

## 2014-10-04 MED ORDER — LIDOCAINE HCL (CARDIAC) 20 MG/ML IV SOLN
INTRAVENOUS | Status: DC | PRN
  Administered 2014-10-04: 100 mg via INTRAVENOUS

## 2014-10-04 MED ORDER — SODIUM CHLORIDE 0.9 % IJ SOLN
10.0000 mL | INTRAMUSCULAR | Status: DC | PRN
  Administered 2014-10-06 – 2014-10-14 (×8): 10 mL
  Filled 2014-10-04 (×8): qty 40

## 2014-10-04 MED ORDER — MEPERIDINE HCL 25 MG/ML IJ SOLN: 6.2500 mg | INTRAMUSCULAR | Status: DC | PRN

## 2014-10-04 MED ORDER — ONDANSETRON HCL 4 MG/2ML IJ SOLN
INTRAMUSCULAR | Status: DC | PRN
  Administered 2014-10-04: 4 mg via INTRAVENOUS

## 2014-10-04 MED ORDER — GLYCOPYRROLATE 0.2 MG/ML IJ SOLN
INTRAMUSCULAR | Status: AC
  Filled 2014-10-04: qty 4

## 2014-10-04 MED ORDER — PHENYLEPHRINE 40 MCG/ML (10ML) SYRINGE FOR IV PUSH (FOR BLOOD PRESSURE SUPPORT)
PREFILLED_SYRINGE | INTRAVENOUS | Status: AC
  Filled 2014-10-04: qty 20

## 2014-10-04 MED ORDER — NEOSTIGMINE METHYLSULFATE 10 MG/10ML IV SOLN
INTRAVENOUS | Status: DC | PRN
  Administered 2014-10-04: 4 mg via INTRAVENOUS

## 2014-10-04 SURGICAL SUPPLY — 48 items
BAG URIMETER 350ML BARDEX IC (UROLOGICAL SUPPLIES) ×1
BAG URIMETER BARDEX IC 350 (UROLOGICAL SUPPLIES) ×2 IMPLANT
BENZOIN TINCTURE PRP APPL 2/3 (GAUZE/BANDAGES/DRESSINGS) IMPLANT
BLADE SURG ROTATE 9660 (MISCELLANEOUS) IMPLANT
BNDG GAUZE ELAST 4 BULKY (GAUZE/BANDAGES/DRESSINGS) ×3 IMPLANT
CANISTER SUCTION 2500CC (MISCELLANEOUS) ×3 IMPLANT
CONT SPEC 4OZ CLIKSEAL STRL BL (MISCELLANEOUS) ×3 IMPLANT
COVER SURGICAL LIGHT HANDLE (MISCELLANEOUS) ×3 IMPLANT
DRAPE LAPAROTOMY T 102X78X121 (DRAPES) ×3 IMPLANT
DRAPE UTILITY XL STRL (DRAPES) ×3 IMPLANT
DRSG PAD ABDOMINAL 8X10 ST (GAUZE/BANDAGES/DRESSINGS) ×6 IMPLANT
ELECT CAUTERY BLADE 6.4 (BLADE) ×3 IMPLANT
ELECT REM PT RETURN 9FT ADLT (ELECTROSURGICAL) ×3
ELECTRODE REM PT RTRN 9FT ADLT (ELECTROSURGICAL) ×1 IMPLANT
GAUZE SPONGE 4X4 12PLY STRL (GAUZE/BANDAGES/DRESSINGS) IMPLANT
GLOVE BIO SURGEON STRL SZ7 (GLOVE) ×3 IMPLANT
GLOVE BIO SURGEON STRL SZ8 (GLOVE) ×3 IMPLANT
GLOVE BIOGEL PI IND STRL 7.0 (GLOVE) ×3 IMPLANT
GLOVE BIOGEL PI IND STRL 7.5 (GLOVE) IMPLANT
GLOVE BIOGEL PI IND STRL 8 (GLOVE) ×1 IMPLANT
GLOVE BIOGEL PI INDICATOR 7.0 (GLOVE) ×6
GLOVE BIOGEL PI INDICATOR 7.5 (GLOVE)
GLOVE BIOGEL PI INDICATOR 8 (GLOVE) ×2
GOWN STRL REUS W/ TWL LRG LVL3 (GOWN DISPOSABLE) ×2 IMPLANT
GOWN STRL REUS W/ TWL XL LVL3 (GOWN DISPOSABLE) ×1 IMPLANT
GOWN STRL REUS W/TWL LRG LVL3 (GOWN DISPOSABLE) ×4
GOWN STRL REUS W/TWL XL LVL3 (GOWN DISPOSABLE) ×2
KIT BASIN OR (CUSTOM PROCEDURE TRAY) ×3 IMPLANT
KIT ROOM TURNOVER OR (KITS) ×3 IMPLANT
MARKER SKIN DUAL TIP RULER LAB (MISCELLANEOUS) ×3 IMPLANT
NEEDLE 22X1 1/2 (OR ONLY) (NEEDLE) ×3 IMPLANT
NS IRRIG 1000ML POUR BTL (IV SOLUTION) ×3 IMPLANT
PACK SURGICAL SETUP 50X90 (CUSTOM PROCEDURE TRAY) ×3 IMPLANT
PAD ARMBOARD 7.5X6 YLW CONV (MISCELLANEOUS) ×9 IMPLANT
PENCIL BUTTON HOLSTER BLD 10FT (ELECTRODE) ×3 IMPLANT
SPECIMEN JAR MEDIUM (MISCELLANEOUS) ×6 IMPLANT
SPECIMEN JAR SMALL (MISCELLANEOUS) IMPLANT
SPONGE LAP 18X18 X RAY DECT (DISPOSABLE) ×3 IMPLANT
SUT ETHILON 2 0 FS 18 (SUTURE) IMPLANT
SUT VIC AB 3-0 SH 18 (SUTURE) IMPLANT
SYR BULB 3OZ (MISCELLANEOUS) ×3 IMPLANT
SYR CONTROL 10ML LL (SYRINGE) ×3 IMPLANT
TOWEL OR 17X24 6PK STRL BLUE (TOWEL DISPOSABLE) IMPLANT
TOWEL OR 17X26 10 PK STRL BLUE (TOWEL DISPOSABLE) ×3 IMPLANT
TUBE CONNECTING 12'X1/4 (SUCTIONS) ×1
TUBE CONNECTING 12X1/4 (SUCTIONS) ×2 IMPLANT
WATER STERILE IRR 1000ML POUR (IV SOLUTION) IMPLANT
YANKAUER SUCT BULB TIP NO VENT (SUCTIONS) ×3 IMPLANT

## 2014-10-04 NOTE — Progress Notes (Signed)
Day of Surgery  Subjective: Not answering questions, got some MM relaxer recently. I spoke at length with his mother.  Objective: Vital signs in last 24 hours: Temp:  [98.3 F (36.8 C)-99.6 F (37.6 C)] 98.3 F (36.8 C) (12/28 0503) Pulse Rate:  [73-83] 73 (12/28 0503) Resp:  [15] 15 (12/28 0503) BP: (101-122)/(65-76) 101/65 mmHg (12/28 0503) SpO2:  [98 %-100 %] 98 % (12/28 0503) Last BM Date: 10/01/14  Intake/Output from previous day: 12/27 0701 - 12/28 0700 In: 1111 [P.O.:148; I.V.:513; IV Piggyback:450] Out: 2277 [Urine:2277] Intake/Output this shift:    General appearance: cooperative Resp: clear to auscultation bilaterally GI: soft Incision/Wound:R iscial decub with necrotic tissue  Lab Results:   Recent Labs  10/02/14 0334 10/03/14 0717  WBC 15.2* 13.7*  HGB 8.5* 9.4*  HCT 26.5* 28.9*  PLT 567* 597*   BMET  Recent Labs  10/02/14 0334  NA 133*  K 4.0  CL 105  CO2 22  GLUCOSE 173*  BUN 10  CREATININE 0.60  CALCIUM 9.4   PT/INR No results for input(s): LABPROT, INR in the last 72 hours. ABG No results for input(s): PHART, HCO3 in the last 72 hours.  Invalid input(s): PCO2, PO2  Studies/Results: Mr Pelvis W Wo Contrast  10/03/2014   CLINICAL DATA:  Quadriplegic patient with methicillin sensitive bacteremia and decubitus ulcers over the ischial tuberosities.  EXAM: MRI PELVIS WITHOUT AND WITH CONTRAST  TECHNIQUE: Multiplanar multisequence MR imaging of the pelvis was performed both before and after administration of intravenous contrast.  CONTRAST:  12 mL MULTIHANCE GADOBENATE DIMEGLUMINE 529 MG/ML IV SOLN  COMPARISON:  MRI of the pelvis with and without contrast 09/16/2014.  FINDINGS: As on the prior study, the patient has decubitus ulcers over the ischial tuberosities, larger on the right. The decubitus ulcer on the right extends to bone. There is marrow edema and enhancement in these ischial tuberosity and right inferior pubic ramus consistent with  osteomyelitis. The extent of marrow signal change appears slightly increased compared to the prior exam. There is likely cortical destructive change in the ischial tuberosity. No rim enhancing fluid collection is seen in this location although there is intense underlying edema and enhancement in the gluteal musculature consistent with myositis. A rind of soft tissue about the wound does not enhance consistent with necrosis.  Marrow edema in the left ischial tuberosity is normal without evidence of osteomyelitis. As seen on the right, there is an edema and enhancement in the gluteal musculature about the wound consistent with myositis. A rind of soft tissue about the wound which does not enhance compatible with necrosis.  Diffuse subcutaneous edema is seen about the pelvis. There is no hip joint effusion. No evidence of bursitis is identified.  IMPRESSION: Large decubitus ulcer over the right distal tuberosity with edema and enhancement in the right inferior pubic ramus consistent with osteomyelitis. No abscess is identified although a rind of necrotic tissue surrounds the wound. Edema and enhancement in the gluteal musculature surrounding the wound is consistent with myositis  Smaller decubitus ulcer over the left distal tuberosity without underlying abscess or osteomyelitis. Rind of nonenhancing tissue in the wound is consistent with necrosis.   Electronically Signed   By: Inge Rise M.D.   On: 10/03/2014 13:07    Anti-infectives: Anti-infectives    Start     Dose/Rate Route Frequency Ordered Stop   09/30/14 1400  vancomycin (VANCOCIN) IVPB 1000 mg/200 mL premix  Status:  Discontinued     1,000 mg200 mL/hr  over 60 Minutes Intravenous Every 24 hours 09/29/14 1326 09/29/14 1848   09/30/14 1400  vancomycin (VANCOCIN) IVPB 1000 mg/200 mL premix  Status:  Discontinued     1,000 mg200 mL/hr over 60 Minutes Intravenous Every 24 hours 09/29/14 1916 09/30/14 0845   09/30/14 1000  vancomycin (VANCOCIN) IVPB  750 mg/150 ml premix     750 mg150 mL/hr over 60 Minutes Intravenous Every 12 hours 09/30/14 0845     09/29/14 2200  aztreonam (AZACTAM) 1 g in dextrose 5 % 50 mL IVPB  Status:  Discontinued     1 g100 mL/hr over 30 Minutes Intravenous 3 times per day 09/29/14 1326 09/29/14 1848   09/29/14 2200  aztreonam (AZACTAM) 1 g in dextrose 5 % 50 mL IVPB  Status:  Discontinued     1 g100 mL/hr over 30 Minutes Intravenous 3 times per day 09/29/14 1916 10/01/14 1426   09/29/14 1300  aztreonam (AZACTAM) 2 g in dextrose 5 % 50 mL IVPB     2 g100 mL/hr over 30 Minutes Intravenous STAT 09/29/14 1239 09/29/14 1355   09/29/14 1245  vancomycin (VANCOCIN) IVPB 1000 mg/200 mL premix     1,000 mg200 mL/hr over 60 Minutes Intravenous  Once 09/29/14 1239 09/29/14 1530      Assessment/Plan: s/p Procedure(s): DEBRIDMENT OF DECUBITUS ULCER (N/A) Iscial decubitus - to OR for debridement and CX. Procedure, risks, and benefits D/W mother who signed consent. She is a CNA and RT who is his full time care giver.  LOS: 5 days    Jihan Rudy E 10/04/2014

## 2014-10-04 NOTE — Progress Notes (Signed)
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 Infectious disease consulting.   Assessment/Plan:  1. Sepsis/fever: Exact source not known but ID is suspecting more from right ischial tuberosity decubitus ulcer with underlying osteomyelitis. Foley catheter changed this admission. Blood cultures 2 negative to date. Persistent left lower lobe consolidation on chest x-ray but no respiratory symptoms. Recently treated for MSSA bacteremia. Recent TEE was negative. Wound care consultation appreciated. Continue IV vancomycin. Aztreonam seems to have been discontinued by ID on 12/26. No fevers. Pelvic MRI results appreciated. Status post debridement of decubitus ulcer on 12/28. As per ID, IV vancomycin for 6-8 weeks. Will need PICC line placed. 2. Acute encephalopathy: Secondary to problem #1. Resolved. No focal deficits. 3. Acute kidney injury: Related to sepsis and dehydration. Resolved. 4. Type II DM/IDDM: Continue Lantus and SSI and adjust as needed. Fluctuating inpatient control. 5. History of severe protein calorie malnutrition: Continue nutritional supplements. 6. Sacral decubitus ulcer/pelvic OM: Wound care consultation on 12/24 appreciated. Patient has multiple sacral/buttock stage III ulcers, right ischial stage IV ulcer, left foot with several full-thickness wounds, fifth toe and fourth toe wound. Management per wound care team. Status post right ischemic tuberosity decubitus ulcer debridement 12/28. 7. History of GERD: Continue PPI. 8. History of spinal cord infarction and  quadriparesis: Paraplegic in lower extremities and weak in the upper extremities. 9. Dehydration with hyponatremia: resolved. DC IVF 10. Leukocytosis: Secondary to problem #1. Follow CBCs. Improving. 11. History of recently treated MSSA bacteremia: Continue above antibiotics and follow blood culture results. 12. Hypertension: Soft blood pressures. Hold antihypertensives. 13. Chronic benzodiazepine use: Continue same. 14. Chronic Foley catheter and traumatic hypospadias: Patient states that urology had consulted in the past and recommended suprapubic catheter but he was not medically stable to get it at that time. Discussed with urologist on call Dr. Era Bumpers on 12/24 would recommended looping the Foley catheter and taping it to abdomen to reduce gravity related further trauma to penis and decreasing the chance of urethral stricture. He also recommended Neosporin ointment or the junction of the Foley catheter in the penis and outpatient follow-up with urology to evaluate whether he would benefit from a suprapubic catheter. As per mother, follows with Dr. [facility] 15. Acute on chronic Anemia: Likely secondary to acute illness and dilutional. No reported bleeding. Stable   Code Status:  Full Family Communication:  Discussed with mother at bedside prior to surgery. Disposition Plan:  Not medically stable for discharge.   Consultants:  Infectious disease  General surgery  Procedures:   Changed Foley catheter  Debridement of decubitus ulcer 12/28  Antibiotics:   IV vancomycin and 12/23 >  IV aztreonam 12/23 > 12/26  Subjective: Denies complaints. Seen prior to surgery.  Objective: Filed Vitals:   10/04/14 1445 10/04/14 1500 10/04/14 1513 10/04/14 1533  BP: 99/67 113/77 121/83 95/69  Pulse: 73 73 72 71  Temp:   97.8 F (36.6 C) 97.4 F (36.3 C)  TempSrc:    Oral  Resp: 16 12 11 16   Height:      Weight:      SpO2: 100% 100% 100% 100%    Intake/Output  Summary (Last  24 hours) at 10/04/14 1709 Last data filed at 10/04/14 1426  Gross per 24 hour  Intake   2011 ml  Output   3952 ml  Net  -1941 ml   Filed Weights   10/01/14 0408 10/02/14 0455 10/02/14 1841  Weight: 55.4 kg (122 lb 2.2 oz) 55.3 kg (121 lb 14.6 oz) 57 kg (125 lb 10.6 oz)     Exam:  General exam:  Moderately built and thinly nourished chronically ill-looking young male lying comfortably in bed. Patient was seen prior to surgery. Respiratory system: Clear. No increased work of breathing. Cardiovascular system: S1 & S2 heard, RRR. No JVD, murmurs, gallops, clicks or pedal edema. Gastrointestinal system: Abdomen is nondistended, soft and nontender. Normal bowel sounds heard. Central nervous system: Alert and oriented. No focal neurological deficits. Extremities:  Grade 0 x 5 power in lower extremities and grade 3 x 5 power in upper extremities proximally and 0 x 5 power distally with contractures of fingers. Skin: Multiple wounds > evaluation as per [facility] notes 12/24   Data Reviewed: Basic Metabolic Panel:  Recent Labs Lab 09/29/14 1221 09/29/14 1916 09/30/14 0411 10/01/14 0345 10/02/14 0334  NA 131*  --  137 131* 133*  K 4.6  --  4.0 4.2 4.0  CL 104  --  110 106 105  CO2 17*  --  20 20 22   GLUCOSE 338*  --  132* 221* 173*  BUN 39*  --  24* 11 10  CREATININE 1.47*  --  0.80 0.65 0.60  CALCIUM 9.2  --  9.6 9.1 9.4  MG  --  1.5  --   --   --   PHOS  --  3.2  --   --   --    Liver Function Tests:  Recent Labs Lab 09/29/14 1221  AST 11  ALT 11  ALKPHOS 76  BILITOT 0.5  PROT 7.7  ALBUMIN 2.5*   No results for input(s): LIPASE, AMYLASE in the last 168 hours. No results for input(s): AMMONIA in the last 168 hours. CBC:  Recent Labs Lab 09/29/14 1221 09/30/14 0411 10/01/14 0345 10/02/14 0334 10/03/14 0717  WBC 27.2* 25.6* 16.5* 15.2* 13.7*  NEUTROABS 20.1*  --   --   --   --   HGB 9.6* 9.5* 8.9* 8.5* 9.4*  HCT 29.7* 29.2* 27.7* 26.5* 28.9*  MCV 83.9  82.5 82.2 81.8 81.2  PLT 477* 496* 479* 567* 597*   Cardiac Enzymes: No results for input(s): CKTOTAL, CKMB, CKMBINDEX, TROPONINI in the last 168 hours. BNP (last 3 results)  Recent Labs  01/29/14 2113  PROBNP 1343.0*   CBG:  Recent Labs Lab 10/03/14 2159 10/04/14 0803 10/04/14 1005 10/04/14 1202 10/04/14 1436  GLUCAP 217* 111* 124* 131* 144*    Recent Results (from the past 240 hour(s))  Blood Culture (routine x 2)     Status: None (Preliminary result)   Collection Time: 09/29/14 12:16 PM  Result Value Ref Range Status   Specimen Description BLOOD LEFT HAND  Final   Special Requests BOTTLES DRAWN AEROBIC AND ANAEROBIC 5CC  Final   Culture  Setup Time   Final    09/29/2014 18:08 Performed at [facility]    Culture   Final           BLOOD CULTURE RECEIVED NO GROWTH TO DATE CULTURE WILL BE HELD FOR 5 DAYS BEFORE ISSUING A FINAL NEGATIVE REPORT Performed at [facility]    Report  Status PENDING  Incomplete  Blood Culture (routine x 2)     Status: None (Preliminary result)   Collection Time: 09/29/14  1:20 PM  Result Value Ref Range Status   Specimen Description BLOOD RIGHT ANTECUBITAL  Final   Special Requests BOTTLES DRAWN AEROBIC AND ANAEROBIC 5ML  Final   Culture  Setup Time   Final    09/29/2014 18:07 Performed at [facility]    Culture   Final           BLOOD CULTURE RECEIVED NO GROWTH TO DATE CULTURE WILL BE HELD FOR 5 DAYS BEFORE ISSUING A FINAL NEGATIVE REPORT Performed at [facility]    Report Status PENDING  Incomplete  Urine culture     Status: None   Collection Time: 09/29/14  6:28 PM  Result Value Ref Range Status   Specimen Description URINE, CLEAN CATCH  Final   Special Requests NONE  Final   Culture  Setup Time   Final    09/30/2014 02:39 Performed at [place] Performed at [facility]   Final   Culture NO GROWTH Performed at [facility]   Final    Report Status 09/30/2014 FINAL  Final  MRSA PCR Screening     Status: None   Collection Time: 10/04/14  3:22 AM  Result Value Ref Range Status   MRSA by PCR NEGATIVE NEGATIVE Final    Comment:        The GeneXpert MRSA Assay (FDA approved for NASAL specimens only), is one component of a comprehensive MRSA colonization surveillance program. It is not intended to diagnose MRSA infection nor to guide or monitor treatment for MRSA infections.          Studies: Mr Pelvis W Wo Contrast  10/03/2014   CLINICAL DATA:  Quadriplegic patient with methicillin sensitive bacteremia and decubitus ulcers over the ischial tuberosities.  EXAM: MRI PELVIS WITHOUT AND WITH CONTRAST  TECHNIQUE: Multiplanar multisequence MR imaging of the pelvis was performed both before and after administration of intravenous contrast.  CONTRAST:  12 mL MULTIHANCE GADOBENATE DIMEGLUMINE 529 MG/ML IV SOLN  COMPARISON:  MRI of the pelvis with and without contrast 09/16/2014.  FINDINGS: As on the prior study, the patient has decubitus ulcers over the ischial tuberosities, larger on the right. The decubitus ulcer on the right extends to bone. There is marrow edema and enhancement in these ischial tuberosity and right inferior pubic ramus consistent with osteomyelitis. The extent of marrow signal change appears slightly increased compared to the prior exam. There is likely cortical destructive change in the ischial tuberosity. No rim enhancing fluid collection is seen in this location although there is intense underlying edema and enhancement in the gluteal musculature consistent with myositis. A rind of soft tissue about the wound does not enhance consistent with necrosis.  Marrow edema in the left ischial tuberosity is normal without evidence of osteomyelitis. As seen on the right, there is an edema and enhancement in the gluteal musculature about the wound consistent with myositis. A rind of soft tissue about the wound which does  not enhance compatible with necrosis.  Diffuse subcutaneous edema is seen about the pelvis. There is no hip joint effusion. No evidence of bursitis is identified.  IMPRESSION: Large decubitus ulcer over the right distal tuberosity with edema and enhancement in the right inferior pubic ramus consistent with osteomyelitis. No abscess is identified although a rind of necrotic tissue surrounds the  wound. Edema and enhancement in the gluteal musculature surrounding the wound is consistent with myositis  Smaller decubitus ulcer over the left distal tuberosity without underlying abscess or osteomyelitis. Rind of nonenhancing tissue in the wound is consistent with necrosis.   Electronically Signed   By: Inge Rise M.D.   On: 10/03/2014 13:07        Scheduled Meds: . antiseptic oral rinse  7 mL Mouth Rinse BID  . atorvastatin  20 mg Oral Daily  . bacitracin   Topical BID  . collagenase   Topical Daily  . feeding supplement (GLUCERNA SHAKE)  237 mL Oral TID BM  . fentaNYL      . fentaNYL      . heparin  5,000 Units Subcutaneous 3 times per day  . insulin aspart  0-5 Units Subcutaneous QHS  . insulin aspart  0-9 Units Subcutaneous TID WC  . insulin aspart  4 Units Subcutaneous TID WC  . insulin glargine  25 Units Subcutaneous QHS  . loratadine  10 mg Oral Daily  . metoCLOPramide  5 mg Oral Once per day on Mon Thu  . mupirocin cream   Topical BID  . pantoprazole  40 mg Oral Daily  . polyethylene glycol  17 g Oral Daily  . pregabalin  100 mg Oral QID  . sodium chloride  3 mL Intravenous Q12H  . vancomycin  750 mg Intravenous Q12H   Continuous Infusions: . sodium chloride 0.45 % 1,000 mL with potassium chloride 20 mEq infusion 75 mL/hr at 10/03/14 2312    Active Problems:   GERD   Protein-calorie malnutrition, severe   IDDM (insulin dependent diabetes mellitus)   Spinal cord infarction (history of)   Sepsis   UTI (lower urinary tract infection)   Osteomyelitis, pelvis   Cellulitis  and abscess of leg, except foot   Abscess   Staphylococcus aureus bacteremia with sepsis    Time spent: 30 minutes.    Vernell Leep, MD, FACP, FHM. [facility] Pager [phone]  If 7PM-7AM, please contact night-coverage [email or web address] Password [email or web address] 10/04/2014, 5:09 PM    LOS: 5 days

## 2014-10-04 NOTE — Telephone Encounter (Signed)
FYI: [name] states patient is in the hospital and is having surgery today at 2 pm, deep tissue debridement.

## 2014-10-04 NOTE — Progress Notes (Signed)
Peripherally Inserted Central Catheter/Midline Placement  The IV Nurse has discussed with the patient and/or persons authorized to consent for the patient, the purpose of this procedure and the potential benefits and risks involved with this procedure.  The benefits include less needle sticks, lab draws from the catheter and patient may be discharged home with the catheter.  Risks include, but not limited to, infection, bleeding, blood clot (thrombus formation), and puncture of an artery; nerve damage and irregular heat beat.  Alternatives to this procedure were also discussed.  PICC/Midline Placement Documentation  PICC / Midline Single Lumen 97/53/00 PICC Right Basilic 38 cm 0 cm (Active)  Indication for Insertion or Continuance of Line Home intravenous therapies (PICC only) 10/04/2014  9:50 PM  Exposed Catheter (cm) 0 cm 10/04/2014  9:50 PM  Site Assessment Clean;Dry;Intact 10/04/2014  9:50 PM  Line Status Flushed;Saline locked 10/04/2014  9:50 PM  Dressing Type Transparent 10/04/2014  9:50 PM  Dressing Status Clean;Dry;Intact;Antimicrobial disc in place 10/04/2014  9:50 PM  Dressing Change Due 10/11/14 10/04/2014  9:50 PM       Aldona Lento L 10/04/2014, 10:03 PM

## 2014-10-04 NOTE — Transfer of Care (Signed)
Immediate Anesthesia Transfer of Care Note  Patient: Jason [name]  Procedure(s) Performed: Procedure(s): DEBRIDMENT OF DECUBITUS ULCER (N/A)  Patient Location: PACU  Anesthesia Type:General  Level of Consciousness: awake and alert   Airway & Oxygen Therapy: Patient Spontanous Breathing and Patient connected to nasal cannula oxygen  Post-op Assessment: Report given to PACU RN, Post -op Vital signs reviewed and stable and Patient moving all extremities X 4  Post vital signs: Reviewed and stable  Complications: No apparent anesthesia complications

## 2014-10-04 NOTE — Progress Notes (Signed)
[name]         Day # 6  vancomycin  Also received 3 days of aztreonam  Subjective: No new complaints, sleeping comfortably when I visited   Antibiotics:  Anti-infectives    Start     Dose/Rate Route Frequency Ordered Stop   09/30/14 1400  vancomycin (VANCOCIN) IVPB 1000 mg/200 mL premix  Status:  Discontinued     1,000 mg200 mL/hr over 60 Minutes Intravenous Every 24 hours 09/29/14 1326 09/29/14 1848   09/30/14 1400  vancomycin (VANCOCIN) IVPB 1000 mg/200 mL premix  Status:  Discontinued     1,000 mg200 mL/hr over 60 Minutes Intravenous Every 24 hours 09/29/14 1916 09/30/14 0845   09/30/14 1000  vancomycin (VANCOCIN) IVPB 750 mg/150 ml premix     750 mg150 mL/hr over 60 Minutes Intravenous Every 12 hours 09/30/14 0845     09/29/14 2200  aztreonam (AZACTAM) 1 g in dextrose 5 % 50 mL IVPB  Status:  Discontinued     1 g100 mL/hr over 30 Minutes Intravenous 3 times per day 09/29/14 1326 09/29/14 1848   09/29/14 2200  aztreonam (AZACTAM) 1 g in dextrose 5 % 50 mL IVPB  Status:  Discontinued     1 g100 mL/hr over 30 Minutes Intravenous 3 times per day 09/29/14 1916 10/01/14 1426   09/29/14 1300  aztreonam (AZACTAM) 2 g in dextrose 5 % 50 mL IVPB     2 g100 mL/hr over 30 Minutes Intravenous STAT 09/29/14 1239 09/29/14 1355   09/29/14 1245  vancomycin (VANCOCIN) IVPB 1000 mg/200 mL premix     1,000 mg200 mL/hr over 60 Minutes Intravenous  Once 09/29/14 1239 09/29/14 1530      Medications: Scheduled Meds: . antiseptic oral rinse  7 mL Mouth Rinse BID  . atorvastatin  20 mg Oral Daily  . bacitracin   Topical BID  . collagenase   Topical Daily  . feeding supplement (GLUCERNA SHAKE)  237 mL Oral TID BM  . fentaNYL      . fentaNYL      . heparin  5,000 Units Subcutaneous 3 times per day  . insulin aspart  0-5 Units Subcutaneous QHS  . insulin aspart  0-9 Units Subcutaneous TID WC  . insulin aspart  4 Units Subcutaneous TID WC  . insulin  glargine  25 Units Subcutaneous QHS  . loratadine  10 mg Oral Daily  . metoCLOPramide  5 mg Oral Once per day on Mon Thu  . mupirocin cream   Topical BID  . pantoprazole  40 mg Oral Daily  . polyethylene glycol  17 g Oral Daily  . pregabalin  100 mg Oral QID  . sodium chloride  3 mL Intravenous Q12H  . vancomycin  750 mg Intravenous Q12H   Continuous Infusions: . sodium chloride 0.45 % 1,000 mL with potassium chloride 20 mEq infusion 75 mL/hr at 10/03/14 2312   PRN Meds:.acetaminophen **OR** acetaminophen, albuterol, ALPRAZolam, fentaNYL, methocarbamol, morphine injection, ondansetron, oxyCODONE    Objective: Weight change:   Intake/Output Summary (Last 24 hours) at 10/04/14 1555 Last data filed at 10/04/14 1426  Gross per 24 hour  Intake   2011 ml  Output   3952 ml  Net  -1941 ml   Blood pressure 95/69, pulse 71, temperature 97.4 F (36.3 C), temperature source Oral, resp. rate 16, height 5\' 8"  (1.727 m), weight 125 lb 10.6 oz (57 kg), SpO2 100 %. Temp:  [97.4 F (36.3 C)-99.6 F (37.6  C)] 97.4 F (36.3 C) (12/28 1533) Pulse Rate:  [70-83] 71 (12/28 1533) Resp:  [11-16] 16 (12/28 1533) BP: (95-122)/(63-83) 95/69 mmHg (12/28 1533) SpO2:  [98 %-100 %] 100 % (12/28 1533)  Physical Exam: General: sleeping comfortably answered questions from mother with whom he had requested I discuss his case yesterday Did not examine today  Sacral decubitus ulcers past pictures and yesterday  08/12/2014:        picture sacrum 10/02/14:         Left foot 10/01/2014:       Neuro: paraplegic  CBC:  CBC Latest Ref Rng 10/03/2014 10/02/2014 10/01/2014  WBC 4.0 - 10.5 K/uL 13.7(H) 15.2(H) 16.5(H)  Hemoglobin 13.0 - 17.0 g/dL 9.4(L) 8.5(L) 8.9(L)  Hematocrit 39.0 - 52.0 % 28.9(L) 26.5(L) 27.7(L)  Platelets 150 - 400 K/uL 597(H) 567(H) 479(H)      BMET  Recent Labs  10/02/14 0334  NA 133*  K 4.0  CL 105  CO2 22  GLUCOSE 173*  BUN 10  CREATININE 0.60    CALCIUM 9.4     Liver Panel  No results for input(s): PROT, ALBUMIN, AST, ALT, ALKPHOS, BILITOT, BILIDIR, IBILI in the last 72 hours.     Sedimentation Rate No results for input(s): ESRSEDRATE in the last 72 hours. C-Reactive Protein No results for input(s): CRP in the last 72 hours.  Micro Results: Recent Results (from the past 720 hour(s))  Blood Culture (routine x 2)     Status: None   Collection Time: 09/16/14  3:25 AM  Result Value Ref Range Status   Specimen Description BLOOD RIGHT ARM  Final   Special Requests BOTTLES DRAWN AEROBIC AND ANAEROBIC 10CC  Final   Culture  Setup Time   Final    09/16/2014 08:58 Performed at [place]   Final    NO GROWTH 5 DAYS Performed at [facility]    Report Status 09/22/2014 FINAL  Final  Blood Culture (routine x 2)     Status: None   Collection Time: 09/16/14  3:30 AM  Result Value Ref Range Status   Specimen Description BLOOD LEFT ARM  Final   Special Requests BOTTLES DRAWN AEROBIC ONLY 10CC  Final   Culture  Setup Time   Final    09/16/2014 08:58 Performed at [place]   Final    NO GROWTH 5 DAYS Performed at [facility]    Report Status 09/22/2014 FINAL  Final  Urine culture     Status: None   Collection Time: 09/16/14  4:09 AM  Result Value Ref Range Status   Specimen Description URINE, RANDOM  Final   Special Requests NONE  Final   Culture  Setup Time   Final    09/16/2014 08:45 Performed at [place]   Final    >=100,000 COLONIES/ML Performed at [place]   Final    KLEBSIELLA SPECIES Performed at [facility]    Report Status 09/18/2014 FINAL  Final   Organism ID, Bacteria KLEBSIELLA SPECIES  Final      Susceptibility   Klebsiella species - MIC*    AMPICILLIN >=32 RESISTANT Resistant     CEFAZOLIN <=4 SENSITIVE Sensitive     CEFTRIAXONE <=1 SENSITIVE Sensitive     CIPROFLOXACIN  <=0.25 SENSITIVE Sensitive     GENTAMICIN <=1 SENSITIVE Sensitive     NITROFURANTOIN <=16 SENSITIVE  Sensitive     TOBRAMYCIN <=1 SENSITIVE Sensitive     TRIMETH/SULFA <=20 SENSITIVE Sensitive     PIP/TAZO 8 SENSITIVE Sensitive     * KLEBSIELLA SPECIES  MRSA PCR Screening     Status: None   Collection Time: 09/16/14  7:08 AM  Result Value Ref Range Status   MRSA by PCR NEGATIVE NEGATIVE Final    Comment:        The GeneXpert MRSA Assay (FDA approved for NASAL specimens only), is one component of a comprehensive MRSA colonization surveillance program. It is not intended to diagnose MRSA infection nor to guide or monitor treatment for MRSA infections.   Culture, respiratory (tracheal aspirate)     Status: None   Collection Time: 09/17/14  6:19 AM  Result Value Ref Range Status   Specimen Description TRACHEAL ASPIRATE  Final   Special Requests NONE  Final   Gram Stain   Final    FEW WBC PRESENT,BOTH PMN AND MONONUCLEAR RARE SQUAMOUS EPITHELIAL CELLS PRESENT NO ORGANISMS SEEN Performed at [facility]    Culture   Final    FEW STAPHYLOCOCCUS AUREUS Note: RIFAMPIN AND GENTAMICIN SHOULD NOT BE USED AS SINGLE DRUGS FOR TREATMENT OF STAPH INFECTIONS. Performed at [facility]    Report Status 09/20/2014 FINAL  Final   Organism ID, Bacteria STAPHYLOCOCCUS AUREUS  Final      Susceptibility   Staphylococcus aureus - MIC*    CLINDAMYCIN >=8 RESISTANT Resistant     ERYTHROMYCIN >=8 RESISTANT Resistant     GENTAMICIN <=0.5 SENSITIVE Sensitive     LEVOFLOXACIN 0.25 SENSITIVE Sensitive     OXACILLIN 1 SENSITIVE Sensitive     PENICILLIN >=0.5 RESISTANT Resistant     RIFAMPIN <=0.5 SENSITIVE Sensitive     TRIMETH/SULFA <=10 SENSITIVE Sensitive     VANCOMYCIN 1 SENSITIVE Sensitive     TETRACYCLINE >=16 RESISTANT Resistant     MOXIFLOXACIN <=0.25 SENSITIVE Sensitive     * FEW STAPHYLOCOCCUS AUREUS  Clostridium Difficile by PCR     Status: None   Collection Time:  09/21/14 10:10 PM  Result Value Ref Range Status   C difficile by pcr NEGATIVE NEGATIVE Final  Blood Culture (routine x 2)     Status: None (Preliminary result)   Collection Time: 09/29/14 12:16 PM  Result Value Ref Range Status   Specimen Description BLOOD LEFT HAND  Final   Special Requests BOTTLES DRAWN AEROBIC AND ANAEROBIC 5CC  Final   Culture  Setup Time   Final    09/29/2014 18:08 Performed at [facility]    Culture   Final           BLOOD CULTURE RECEIVED NO GROWTH TO DATE CULTURE WILL BE HELD FOR 5 DAYS BEFORE ISSUING A FINAL NEGATIVE REPORT Performed at [facility]    Report Status PENDING  Incomplete  Blood Culture (routine x 2)     Status: None (Preliminary result)   Collection Time: 09/29/14  1:20 PM  Result Value Ref Range Status   Specimen Description BLOOD RIGHT ANTECUBITAL  Final   Special Requests BOTTLES DRAWN AEROBIC AND ANAEROBIC 5ML  Final   Culture  Setup Time   Final    09/29/2014 18:07 Performed at [facility]    Culture   Final           BLOOD CULTURE RECEIVED NO GROWTH TO DATE CULTURE WILL BE HELD FOR 5 DAYS BEFORE ISSUING A FINAL NEGATIVE REPORT Performed at [facility]    Report Status PENDING  Incomplete  Urine culture     Status: None   Collection Time: 09/29/14  6:28 PM  Result Value Ref Range Status   Specimen Description URINE, CLEAN CATCH  Final   Special Requests NONE  Final   Culture  Setup Time   Final    09/30/2014 02:39 Performed at [place] Performed at [facility]   Final   Culture NO GROWTH Performed at [facility]   Final   Report Status 09/30/2014 FINAL  Final  MRSA PCR Screening     Status: None   Collection Time: 10/04/14  3:22 AM  Result Value Ref Range Status   MRSA by PCR NEGATIVE NEGATIVE Final    Comment:        The GeneXpert MRSA Assay (FDA approved for NASAL specimens only), is one component of a comprehensive MRSA  colonization surveillance program. It is not intended to diagnose MRSA infection nor to guide or monitor treatment for MRSA infections.     Studies/Results: Mr Pelvis W Wo Contrast  10/03/2014   CLINICAL DATA:  Quadriplegic patient with methicillin sensitive bacteremia and decubitus ulcers over the ischial tuberosities.  EXAM: MRI PELVIS WITHOUT AND WITH CONTRAST  TECHNIQUE: Multiplanar multisequence MR imaging of the pelvis was performed both before and after administration of intravenous contrast.  CONTRAST:  12 mL MULTIHANCE GADOBENATE DIMEGLUMINE 529 MG/ML IV SOLN  COMPARISON:  MRI of the pelvis with and without contrast 09/16/2014.  FINDINGS: As on the prior study, the patient has decubitus ulcers over the ischial tuberosities, larger on the right. The decubitus ulcer on the right extends to bone. There is marrow edema and enhancement in these ischial tuberosity and right inferior pubic ramus consistent with osteomyelitis. The extent of marrow signal change appears slightly increased compared to the prior exam. There is likely cortical destructive change in the ischial tuberosity. No rim enhancing fluid collection is seen in this location although there is intense underlying edema and enhancement in the gluteal musculature consistent with myositis. A rind of soft tissue about the wound does not enhance consistent with necrosis.  Marrow edema in the left ischial tuberosity is normal without evidence of osteomyelitis. As seen on the right, there is an edema and enhancement in the gluteal musculature about the wound consistent with myositis. A rind of soft tissue about the wound which does not enhance compatible with necrosis.  Diffuse subcutaneous edema is seen about the pelvis. There is no hip joint effusion. No evidence of bursitis is identified.  IMPRESSION: Large decubitus ulcer over the right distal tuberosity with edema and enhancement in the right inferior pubic ramus consistent with  osteomyelitis. No abscess is identified although a rind of necrotic tissue surrounds the wound. Edema and enhancement in the gluteal musculature surrounding the wound is consistent with myositis  Smaller decubitus ulcer over the left distal tuberosity without underlying abscess or osteomyelitis. Rind of nonenhancing tissue in the wound is consistent with necrosis.   Electronically Signed   By: Inge Rise M.D.   On: 10/03/2014 13:07      Assessment/Plan:  Active Problems:   GERD   Protein-calorie malnutrition, severe   IDDM (insulin dependent diabetes mellitus)   Spinal cord infarction (history of)   Sepsis   UTI (lower urinary tract infection)   Osteomyelitis, pelvis   Cellulitis and abscess of leg, except foot   Abscess   Staphylococcus aureus  bacteremia with sepsis    Jason [name] is a 28 y.o. male with  Admission for MSSA bacteremia In November. 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 He did not undergo debridement but was followed closely General surgery and had some purulent material drained spontaneous once the eschar was removed while he was receiving hydrotherapy. There also been concern for possible infection of his feet. He was supposed to receive 6-8 weeks of IV antibiotics but only received 4 weeks. He was again admitted in December to the ICU and MRI done during that admission of pelvis which had shown an enlarging multiloculated fluid collection around his ischial his tuberosity. He was seen again by general surgery but they felt that he did not need debridement of this site. Here MRI performed of his left foot which had suggested an abscess near his calcaneus. I do not see that we were reconsulted at that time. Ultimately was discharged home on the 18th apparently without any antibiotics at all. He has been readmitted only 5 days later with a septic picture. MRI done  shows osteomyelitis with increased edema in ischium  and pubic ramus vs prior study. No abscess but edema and enhancement in surrounding tissue c/w myositis  #1 Sepsis: the OBVIOUS PRIME SUSPECT is his ischial area I suspect this source is what seeded his blood with Streptococcus aureus. Imaging shows worsening of marrow edema in these areas  GREATLY APPRECIATE DR THOMPSON's help here with debridement including sending bone tissue for culture  Would plan retreate  IV vancomycin for another 6-8 weeks post a debridement  BUT OBVIOUSLY followup BONE CULTURES  IN TALKING TO MOM THE PATIENT IS NOT VERY COOPERATIVE ABOUT TURNING FREQUENTLY AT HOME AND SPENDS MOST OF HIS DAY IN A CHAIR THAT DOES NOT OFF-LOAD PRESSURE TO HIS DECUBITUS ULCERS--CLEARLY THIS IS SOMETHING THAT HE WILL HAVE TO DO IF THIS IS TO EVER BE CORRECTED!  Dr. Baxter Flattery is covering for the next 4 days thru January 1st and will be available for questions on those days.    LOS: 5 days   [name] 10/04/2014, 3:55 PM

## 2014-10-04 NOTE — Op Note (Signed)
09/29/2014 - 10/04/2014  2:16 PM  PATIENT:  Jason [name]  28 y.o. male  PRE-OPERATIVE DIAGNOSIS:  DECUBITUS ULCER  POST-OPERATIVE DIAGNOSIS: Sacral and right issue-year-old decubitus ulcers with necrotic tissue, ischial wound extends to ischial tuberosity  PROCEDURE:  Procedure(s): DEBRIDMENT OF DECUBITUS ULCER BONE BIOPSY R ISCHIUM  SURGEON:  Surgeon(s): Georganna Skeans, MD  ASSISTANTS: none   ANESTHESIA:   general  EBL:  Total I/O In: 0  Out: 2836 [Urine:1650; Blood:25]  BLOOD ADMINISTERED:none  DRAINS: none   SPECIMEN:  Excision, bone biopsy for culture  DISPOSITION OF SPECIMEN:  PATHOLOGY  COUNTS:  YES  DICTATION: .Dragon Dictation  1.  Patient is brought for debridement of decubiti. Informed consent was obtained from his mother. He is on antibiotic protocol intravenously. He was identified in the preop holding area. He is brought to the operating room and general endotracheal anesthesia was administered by the anesthesia staff. He was placed in prone position. Posterior buttocks and sacral area was prepped and draped in sterile fashion. Time out procedure was performed. There is a small ulcer over the left ischium that did not appear to require debridement. Next the sacrum was debrided sharply of all devitalized tissue using both cautery and scissors back to viable tissue. Hemostasis was achieved. Tissue was sent to pathology. Area was copiously irrigated. This tissue appeared necrotic but not frankly infected. Next, attention was directed to the right ischium. This appeared infected and has MR evidence of osteomyelitis. This wound was debrided of devitalized and purulent infected tissue with cautery and sharp scissor dissection. In light of the findings on MR. I also used a Rominger were to biopsy the ischial tuberosity. This was sent for culture. Hemostasis was obtained with cautery. Wound was further irrigated. Hemostasis was ensured. Some other scattered necrotic  tissue and infected tissue was removed with total dimensions as below. Wound was again irrigated. Hemostasis was ensured. Both wounds were packed with sterile saline soaked gauze and covered by bulky dressings. He tolerated procedure well without apparent competitions taken recovery in stable condition.  2.  Tool used for debridement (curette, scapel, etc.)  cautery, sharp scissor dissection, [place]  3.  Frequency of surgical debridement.   Once  4.  Measurement of total devitalized tissue (wound surface) before and after surgical debridement.   Sacrum 6 x 4 x 0.5 cm before. Sacrum 7 x 5 x 2 cm after. If she 7 x 4 x 3 cm before. Initiate 9 x 5 x 5 cm after.  5.  Area and depth of devitalized tissue removed from wound.  See above.  6.  Blood loss and description of tissue removed.  5 mL.  7.  Evidence of the progress of the wound's response to treatment.  A.  Current wound volume (current dimensions and depth).  See above.  B.  Presence (and extent of) of infection.  See above.  C.  Presence (and extent of) of non viable tissue.  See above.  D.  Other material in the wound that is expected to inhibit healing.  See above.  8.  Was there any viable tissue removed (measurements): Minimal rim.    PATIENT DISPOSITION:  PACU - hemodynamically stable.   Delay start of Pharmacological VTE agent (>24hrs) due to surgical blood loss or risk of bleeding:  no  Georganna Skeans, MD, MPH, FACS Pager: [phone]  12/28/20152:16 PM

## 2014-10-04 NOTE — Anesthesia Procedure Notes (Signed)
Procedure Name: Intubation Date/Time: 10/04/2014 1:28 PM Performed by: Ollen Bowl Pre-anesthesia Checklist: Patient identified, Emergency Drugs available, Suction available, Patient being monitored and Timeout performed Patient Re-evaluated:Patient Re-evaluated prior to inductionOxygen Delivery Method: Circle system utilized and Simple face mask Preoxygenation: Pre-oxygenation with 100% oxygen Intubation Type: IV induction Ventilation: Mask ventilation without difficulty Laryngoscope Size: Miller and 2 Grade View: Grade I Tube type: Oral Tube size: 7.5 mm Number of attempts: 1 Airway Equipment and Method: Patient positioned with wedge pillow and Stylet Placement Confirmation: ETT inserted through vocal cords under direct vision,  positive ETCO2 and breath sounds checked- equal and bilateral Secured at: 22 cm Tube secured with: Tape Dental Injury: Teeth and Oropharynx as per pre-operative assessment

## 2014-10-04 NOTE — Telephone Encounter (Signed)
Attempted to reach [facility] but no answer.  Dr Naaman Plummer is planning on seeing pt today if possible.

## 2014-10-04 NOTE — Progress Notes (Signed)
INITIAL NUTRITION ASSESSMENT  DOCUMENTATION CODES Per approved criteria  -Not Applicable   INTERVENTION: -Continue Glucerna Shake po TID, each supplement provides 220 kcal and 10 grams of protein  NUTRITION DIAGNOSIS: Increased nutrient needs related to wound healing as evidenced by estimated needs.   Goal: Pt will meet >90% of estimated nutritional needs  Monitor:  PO/supplement intake, labs, weight changes, I/O's  Reason for Assessment: Low braden  28 y.o. 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 Infectious disease consulting.  ASSESSMENT: s/p Procedure(s) on 10/04/14: DEBRIDMENT OF DECUBITUS ULCER (N/A) Iscial decubitus - to OR for debridement and CX.  Pt currently in OR at time of visit for debridement of wounds. Nutrition-focused physical exam deferred at this time.  Pt is currently NPO for procedure, but was previously on a carb modified diet. Intake has been good- noted 40-100% of meal completion. Pt is also receiving Glucerna Shakes for additional nutrition support. RD will continue supplements due to increased nutrient needs for wound healing.  Noted hx of weight loss, however, weight has fluctuated over the past year. UBW appears to be in the 135-140# range.Noted a 10.7% wt loss x 6 months.  Labs reviewed. Na: 133, Glucose: 173.   Height: Ht Readings from Last 1 Encounters:  10/01/14 5\' 8"  (1.727 m)    Weight: Wt Readings from Last 1 Encounters:  10/02/14 125 lb 10.6 oz (57 kg)    Ideal Body Weight: 139#  % Ideal Body Weight: 90%  Wt Readings from Last 50 Encounters:  10/02/14 125 lb 10.6 oz (57 kg)   09/23/14 131 lb 1.6 oz (59.467 kg)  08/13/14 129 lb 6.6 oz (58.7 kg)  08/03/14 139 lb (63.05 kg)  07/30/14 139 lb 1 oz (63.078 kg)  07/19/14 135 lb 12.9 oz (61.6 kg)  05/28/14 138 lb 14.2 oz (63 kg)  05/20/14 141 lb (63.957 kg)  04/19/14 140 lb (63.504 kg)  04/02/14 140 lb (63.504 kg)  03/22/14 140 lb (63.504 kg)  03/21/14 140 lb 9.6 oz (63.776 kg)  03/03/14 128 lb 12 oz (58.4 kg)  02/04/14 149 lb 11.1 oz (67.9 kg)  12/25/13 126 lb 15.8 oz (57.6 kg)  12/22/13 154 lb (69.854 kg)  10/13/13 154 lb (69.854 kg)  10/05/13 145 lb (65.772 kg)  09/13/13 136 lb (61.689 kg)  08/26/13 139 lb (63.05 kg)  08/21/13 125 lb (56.7 kg)  08/05/13 125 lb (56.7 kg)  07/06/13 125 lb (56.7 kg)  06/30/13 125 lb 3.5 oz (56.8 kg)  06/30/13 124 lb (56.246 kg)  06/26/13 127 lb (57.607 kg)  06/02/13 121 lb (54.885 kg)  05/21/13 127 lb (57.607 kg)  04/27/13 127 lb (57.607 kg)  04/23/13 127 lb 4 oz (57.72 kg)  04/12/13 129 lb 13.6 oz (58.9 kg)  04/10/13 129 lb (58.514 kg)  04/01/13 129 lb (58.514 kg)  03/31/13 129 lb 2 oz (58.571 kg)  03/28/13 140 lb (63.504 kg)  03/20/13 131 lb 9.6 oz (59.693 kg)  01/28/13 128 lb 4.8 oz (58.196 kg)  01/12/13 157 lb (71.215 kg)  12/09/12 130 lb (58.968 kg)  10/02/12 126 lb (57.153 kg)  12/18/11 138 lb (62.596 kg)  11/27/11 148 lb (67.132 kg)  11/15/11 138 lb (62.596  kg)  08/27/09 152 lb (68.947 kg)  06/09/08 151 lb (68.493 kg)  11/26/07 162 lb (73.483 kg)  09/15/07 152 lb (68.947 kg)   Usual Body Weight: 140#  % Usual Body Weight: 89%  BMI:  Body mass index is 19.11 kg/(m^2). Normal weight range  Estimated Nutritional Needs: Kcal: 2100-2300 Protein: 104-114 grams Fluid: 2.1-2.3 L  Skin: open wound on lt toe, eschar wound on left middle finger, 2 stage III pressure ulcer on sacrum, stage IV pressure ulcer on ischium  Diet Order: Diet Carb Modified  EDUCATION NEEDS: -Education not appropriate at this time   Intake/Output Summary (Last 24 hours) at  10/04/14 1600 Last data filed at 10/04/14 1426  Gross per 24 hour  Intake   2011 ml  Output   3952 ml  Net  -1941 ml    Last BM: 10/01/14  Labs:   Recent Labs Lab 09/29/14 1916 09/30/14 0411 10/01/14 0345 10/02/14 0334  NA  --  137 131* 133*  K  --  4.0 4.2 4.0  CL  --  110 106 105  CO2  --  20 20 22   BUN  --  24* 11 10  CREATININE  --  0.80 0.65 0.60  CALCIUM  --  9.6 9.1 9.4  MG 1.5  --   --   --   PHOS 3.2  --   --   --   GLUCOSE  --  132* 221* 173*    CBG (last 3)   Recent Labs  10/04/14 1005 10/04/14 1202 10/04/14 1436  GLUCAP 124* 131* 144*    Scheduled Meds: . antiseptic oral rinse  7 mL Mouth Rinse BID  . atorvastatin  20 mg Oral Daily  . bacitracin   Topical BID  . collagenase   Topical Daily  . feeding supplement (GLUCERNA SHAKE)  237 mL Oral TID BM  . fentaNYL      . fentaNYL      . heparin  5,000 Units Subcutaneous 3 times per day  . insulin aspart  0-5 Units Subcutaneous QHS  . insulin aspart  0-9 Units Subcutaneous TID WC  . insulin aspart  4 Units Subcutaneous TID WC  . insulin glargine  25 Units Subcutaneous QHS  . loratadine  10 mg Oral Daily  . metoCLOPramide  5 mg Oral Once per day on Mon Thu  . mupirocin cream   Topical BID  . pantoprazole  40 mg Oral Daily  . polyethylene glycol  17 g Oral Daily  . pregabalin  100 mg Oral QID  . sodium chloride  3 mL Intravenous Q12H  . vancomycin  750 mg Intravenous Q12H    Continuous Infusions: . sodium chloride 0.45 % 1,000 mL with potassium chloride 20 mEq infusion 75 mL/hr at 10/03/14 2312    Past Medical History  Diagnosis Date  . GERD (gastroesophageal reflux disease)   . Asthma   . Hx MRSA infection     on face  . Gastroparesis   . Diabetic neuropathy   . Seizures   . Stroke     spinal stroke in 4/15  . Diabetes mellitus     sees Dr. Loanne Drilling   . Family history of anesthesia complication     Pt mother can't have epidural procedures  . Dysrhythmia   . Pneumonia   .  Arthritis   . Fibromyalgia     Past Surgical History  Procedure Laterality Date  . Tonsillectomy    . Multiple extractions with alveoloplasty  N/A 08/03/2014    Procedure: MULTIPLE EXTRACTIONS;  Surgeon: Gae Bon, DDS;  Location: [facility];  Service: Oral Surgery;  Laterality: N/A;  . Tee without cardioversion N/A 08/17/2014    Procedure: TRANSESOPHAGEAL ECHOCARDIOGRAM (TEE);  Surgeon: Dorothy Spark, MD;  Location: [facility];  Service: Cardiovascular;  Laterality: N/A;    Luda Charbonneau [name], RD, LDN Pager: [phone] After hours Pager: [phone]

## 2014-10-04 NOTE — Telephone Encounter (Signed)
i will stop by and see him today, surgery permitting

## 2014-10-04 NOTE — Progress Notes (Signed)
ANTIBIOTIC CONSULT NOTE - FOLLOW UP  Pharmacy Consult for Vancomycin Indication: Osteo  Allergies  Allergen Reactions  . Cefuroxime Axetil Anaphylaxis  . Penicillins Anaphylaxis and Other (See Comments)    ?can take amoxicillin?  Jason [name] [Benzonatate] Anaphylaxis  . Shellfish Allergy Itching and Other (See Comments)    Took benadryl to alleviate reaction    Patient Measurements: Height: 5\' 8"  (172.7 cm) Weight: 125 lb 10.6 oz (57 kg) IBW/kg (Calculated) : 68.4 Adjusted Body Weight:   Vital Signs: Temp: 98.3 F (36.8 C) (12/28 0503) Temp Source: Oral (12/28 0503) BP: 101/65 mmHg (12/28 0503) Pulse Rate: 73 (12/28 0503) Intake/Output from previous day: 12/27 0701 - 12/28 0700 In: 1111 [P.O.:148; I.V.:513; IV Piggyback:450] Out: 2277 [Urine:2277] Intake/Output from this shift: Total I/O In: 0  Out: 950 [Urine:950]  Labs:  Recent Labs  10/02/14 0334 10/03/14 0717  WBC 15.2* 13.7*  HGB 8.5* 9.4*  PLT 567* 597*  CREATININE 0.60  --    Estimated Creatinine Clearance: 108.9 mL/min (by C-G formula based on Cr of 0.6).  Recent Labs  10/01/14 2135  [place] 19.0     Microbiology:   Anti-infectives    Start     Dose/Rate Route Frequency Ordered Stop   09/30/14 1400  vancomycin (VANCOCIN) IVPB 1000 mg/200 mL premix  Status:  Discontinued     1,000 mg200 mL/hr over 60 Minutes Intravenous Every 24 hours 09/29/14 1326 09/29/14 1848   09/30/14 1400  vancomycin (VANCOCIN) IVPB 1000 mg/200 mL premix  Status:  Discontinued     1,000 mg200 mL/hr over 60 Minutes Intravenous Every 24 hours 09/29/14 1916 09/30/14 0845   09/30/14 1000  vancomycin (VANCOCIN) IVPB 750 mg/150 ml premix     750 mg150 mL/hr over 60 Minutes Intravenous Every 12 hours 09/30/14 0845     09/29/14 2200  aztreonam (AZACTAM) 1 g in dextrose 5 % 50 mL IVPB  Status:  Discontinued     1 g100 mL/hr over 30 Minutes Intravenous 3 times per day 09/29/14 1326 09/29/14 1848   09/29/14 2200  aztreonam  (AZACTAM) 1 g in dextrose 5 % 50 mL IVPB  Status:  Discontinued     1 g100 mL/hr over 30 Minutes Intravenous 3 times per day 09/29/14 1916 10/01/14 1426   09/29/14 1300  aztreonam (AZACTAM) 2 g in dextrose 5 % 50 mL IVPB     2 g100 mL/hr over 30 Minutes Intravenous STAT 09/29/14 1239 09/29/14 1355   09/29/14 1245  vancomycin (VANCOCIN) IVPB 1000 mg/200 mL premix     1,000 mg200 mL/hr over 60 Minutes Intravenous  Once 09/29/14 1239 09/29/14 1530      Assessment: CC: 28 yo M admitted on 09/29/2014 with weakness, SOB. Admitted in November for MSSA bacteremia with MRI c/w OM under eschar on right side. Only received 4 weeks of planned 6-8 weeks IV abx. Now readmitted with likely sepsis 2/2 ischial abscess.  PMH: GERD, asthma, diabetic neuropathy, seizures, CVA, DM, fibromyalgia, paraplegia d/t CVA  AC: None PTA, heparin SQ for VTE proph. CBC ok.  ID: Vanc D#6 for sepsis/ischial abscess of decub ulcer - Tmax 99.6. LA 1.11, WBC trend down to 13.7, Recently completed courses of vancomycin and cipro, PTA fluconazole. 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 12/28: deep tissues debridement today.  Vanc 12/23 >>  - 12/25 VT: 19.0 -- (  no change) Aztreo 12/23 >> 12/25  12/23 urine- neg 12/23 blood x2- ngtd  CV: VSS -- on atorva20  Endo: Hx DM - CBGs 111-291 with SSI + lantus  GI/Nutr: Hx GERD - carb modified diet, PPI, reglan, Miralax  Neuro: Hx neuropathy, seizures, CVA, paraplegia (spinal infarct 4/15), fibromyalgia - lyrica (PTA baclofen)  Renal: CrCl>100  Pulm: Hx asthma. RA. Claritin  Heme/Onc: H/H stable, plt elevated; no reported s/s bleeding  Best practices: heparin SQ, PPI  Goal of Therapy:  Vancomycin trough level 15-20 mcg/ml  Plan:  Continue vanc 750mg  IV Q12H   Jason [name], PharmD, BCPS Clinical Staff  Pharmacist Pager [place], [place] 10/04/2014,12:02 PM

## 2014-10-04 NOTE — Telephone Encounter (Signed)
Mother called, Markon is back in the hospital, [name] [phone]), scheduled to have surgery today (unspecified), asking for a nurse call back ASAP

## 2014-10-04 NOTE — Anesthesia Postprocedure Evaluation (Signed)
  Anesthesia Post-op Note  Patient: Jason [name]  Procedure(s) Performed: Procedure(s): DEBRIDMENT OF DECUBITUS ULCER (N/A)  Patient Location: PACU  Anesthesia Type:General  Level of Consciousness: awake  Airway and Oxygen Therapy: Patient Spontanous Breathing and Patient connected to nasal cannula oxygen  Post-op Pain: mild  Post-op Assessment: Post-op Vital signs reviewed, Patient's Cardiovascular Status Stable, Respiratory Function Stable, Patent Airway, No signs of Nausea or vomiting and Pain level controlled  Post-op Vital Signs: Reviewed and stable  Last Vitals:  Filed Vitals:   10/04/14 1445  BP: 99/67  Pulse: 73  Temp:   Resp: 16    Complications: No apparent anesthesia complications

## 2014-10-04 NOTE — Anesthesia Preprocedure Evaluation (Addendum)
Anesthesia Evaluation  Patient identified by MRN, date of birth, ID band Patient awake    Reviewed: Allergy & Precautions, H&P , NPO status , Patient's Chart, lab work & pertinent test results, reviewed documented beta blocker date and time   Airway Mallampati: II  TM Distance: >3 FB Neck ROM: Limited    Dental  (+) Missing, Dental Advisory Given, Chipped, Poor Dentition,    Pulmonary Current Smoker,  breath sounds clear to auscultation        Cardiovascular Rhythm:Regular  ECHO 08/2014 EF 55% no vegetations   Neuro/Psych Seizures -, Well Controlled,     GI/Hepatic Neg liver ROS, GERD-  Medicated,  Endo/Other  diabetes, Poorly Controlled, Insulin Dependent  Renal/GU GFR 90     Musculoskeletal   Abdominal (+)  Abdomen: soft.    Peds  Hematology  (+) anemia , 9/28 H/H   Anesthesia Other Findings   Reproductive/Obstetrics                           Anesthesia Physical Anesthesia Plan  ASA: III  Anesthesia Plan: General   Post-op Pain Management:    Induction: Intravenous  Airway Management Planned: Oral ETT  Additional Equipment:   Intra-op Plan:   Post-operative Plan: Extubation in OR  Informed Consent: I have reviewed the patients History and Physical, chart, labs and discussed the procedure including the risks, benefits and alternatives for the proposed anesthesia with the patient or authorized representative who has indicated his/her understanding and acceptance.   Dental advisory given  Plan Discussed with:   Anesthesia Plan Comments:        Anesthesia Quick Evaluation

## 2014-10-05 ENCOUNTER — Ambulatory Visit: Payer: Medicaid Other | Admitting: Family Medicine

## 2014-10-05 ENCOUNTER — Encounter (HOSPITAL_COMMUNITY): Payer: Self-pay | Admitting: General Surgery

## 2014-10-05 DIAGNOSIS — N179 Acute kidney failure, unspecified: Secondary | ICD-10-CM | POA: Insufficient documentation

## 2014-10-05 LAB — COMPREHENSIVE METABOLIC PANEL
ALBUMIN: 2.1 g/dL — AB (ref 3.5–5.2)
ALT: 8 U/L (ref 0–53)
AST: 12 U/L (ref 0–37)
Alkaline Phosphatase: 87 U/L (ref 39–117)
Anion gap: 4 — ABNORMAL LOW (ref 5–15)
BUN: 13 mg/dL (ref 6–23)
CALCIUM: 9.1 mg/dL (ref 8.4–10.5)
CO2: 26 mmol/L (ref 19–32)
Chloride: 101 mEq/L (ref 96–112)
Creatinine, Ser: 0.62 mg/dL (ref 0.50–1.35)
GFR calc Af Amer: >90 mL/min (ref >90)
Glucose, Bld: 270 mg/dL — ABNORMAL HIGH (ref 70–99)
Potassium: 4.3 mmol/L (ref 3.5–5.1)
SODIUM: 131 mmol/L — AB (ref 135–145)
Total Bilirubin: 0.2 mg/dL — ABNORMAL LOW (ref 0.3–1.2)
Total Protein: 6.8 g/dL (ref 6.0–8.3)

## 2014-10-05 LAB — CBC
HCT: 28 % — ABNORMAL LOW (ref 39.0–52.0)
Hemoglobin: 8.9 g/dL — ABNORMAL LOW (ref 13.0–17.0)
MCH: 26 pg (ref 26.0–34.0)
MCHC: 31.8 g/dL (ref 30.0–36.0)
MCV: 81.9 fL (ref 78.0–100.0)
Platelets: 718 10*3/uL — ABNORMAL HIGH (ref 150–400)
RBC: 3.42 MIL/uL — AB (ref 4.22–5.81)
RDW: 15.7 % — ABNORMAL HIGH (ref 11.5–15.5)
WBC: 12.5 10*3/uL — ABNORMAL HIGH (ref 4.0–10.5)

## 2014-10-05 LAB — CULTURE, BLOOD (ROUTINE X 2)
Culture: NO GROWTH
Culture: NO GROWTH

## 2014-10-05 LAB — GLUCOSE, CAPILLARY
GLUCOSE-CAPILLARY: 259 mg/dL — AB (ref 70–99)
Glucose-Capillary: 277 mg/dL — ABNORMAL HIGH (ref 70–99)
Glucose-Capillary: 164 mg/dL — ABNORMAL HIGH (ref 70–99)
Glucose-Capillary: 207 mg/dL — ABNORMAL HIGH (ref 70–99)

## 2014-10-05 MED ORDER — INSULIN ASPART 100 UNIT/ML ~~LOC~~ SOLN
0.0000 [IU] | Freq: Every day | SUBCUTANEOUS | Status: DC
  Administered 2014-10-05: 2 [IU] via SUBCUTANEOUS
  Administered 2014-10-07 – 2014-10-10 (×2): 3 [IU] via SUBCUTANEOUS
  Administered 2014-10-12: 4 [IU] via SUBCUTANEOUS

## 2014-10-05 MED ORDER — INSULIN GLARGINE 100 UNIT/ML ~~LOC~~ SOLN
30.0000 [IU] | Freq: Every day | SUBCUTANEOUS | Status: DC
  Administered 2014-10-05 – 2014-10-06 (×2): 30 [IU] via SUBCUTANEOUS
  Filled 2014-10-05 (×3): qty 0.3

## 2014-10-05 MED ORDER — INSULIN ASPART 100 UNIT/ML ~~LOC~~ SOLN
0.0000 [IU] | Freq: Three times a day (TID) | SUBCUTANEOUS | Status: DC
  Administered 2014-10-05 – 2014-10-09 (×5): 3 [IU] via SUBCUTANEOUS
  Administered 2014-10-10: 2 [IU] via SUBCUTANEOUS
  Administered 2014-10-11: 8 [IU] via SUBCUTANEOUS
  Administered 2014-10-11: 2 [IU] via SUBCUTANEOUS
  Administered 2014-10-13: 8 [IU] via SUBCUTANEOUS
  Administered 2014-10-13: 2 [IU] via SUBCUTANEOUS
  Administered 2014-10-13: 3 [IU] via SUBCUTANEOUS
  Administered 2014-10-14: 5 [IU] via SUBCUTANEOUS
  Administered 2014-10-14: 2 [IU] via SUBCUTANEOUS

## 2014-10-05 MED ORDER — INSULIN ASPART 100 UNIT/ML ~~LOC~~ SOLN
6.0000 [IU] | Freq: Three times a day (TID) | SUBCUTANEOUS | Status: DC
  Administered 2014-10-05 – 2014-10-10 (×12): 6 [IU] via SUBCUTANEOUS

## 2014-10-05 MED ORDER — HYDROMORPHONE HCL 1 MG/ML IJ SOLN
1.0000 mg | INTRAMUSCULAR | Status: DC | PRN
  Administered 2014-10-05 – 2014-10-13 (×35): 1 mg via INTRAVENOUS
  Filled 2014-10-05 (×37): qty 1

## 2014-10-05 NOTE — Progress Notes (Signed)
Inpatient Diabetes Program Recommendations  AACE/ADA: New Consensus Statement on Inpatient Glycemic Control (2013)  Target Ranges:  Prepandial:   less than 140 mg/dL      Peak postprandial:   less than 180 mg/dL (1-2 hours)      Critically ill patients:  140 - 180 mg/dL   Results for Jason [name], Jason [name] (MRN [id number]) as of 10/05/2014 14:19  Ref. Range 10/04/2014 14:36 10/04/2014 16:59 10/04/2014 22:35 10/05/2014 07:39 10/05/2014 11:40  Glucose-Capillary Latest Range: 70-99 mg/dL 144 (H) 181 (H) 300 (H) 277 (H) 259 (H)    Reason for assessment: elevated CBG  Diabetes history: Type 1 Outpatient Diabetes medications: Novolog 4 units tid with meals, Lantus 25 units qhs Current orders for Inpatient glycemic control: Novolog 4 units tid with meals, Lantus 25 units qhs, Novolog sensitive correction 0-9units tid with meals and 0-5 units qhs  Elevated CBG today, may want to consider increasing Novolog correction to moderate tid with meals, and Lantus to 30 units qhs.   Gentry Fitz, RN, BA, MHA, CDE Diabetes Coordinator Inpatient Diabetes Program  [phone] (Team Pager) [phone] [facility] Office) 10/05/2014 2:25 PM

## 2014-10-05 NOTE — Progress Notes (Signed)
Utilization review completed. Khadijatou Borak, RN, BSN. 

## 2014-10-05 NOTE — Progress Notes (Signed)
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 Infectious disease consulting. Patient sepsis attributed to right ischial tuberosity decubitus ulcer. Surgery consulted and patient underwent debridement and bone biopsy for osteomyelitis on 12/28. Bone biopsy results pending. PICC line placed. ID recommending 6-8 weeks of IV vancomycin. Patient wishes to go home but not sure if we'll have adequate resources for continued wound care. This has to be further explored.  Assessment/Plan:  1. Sepsis/fever: Likely from infected right ischial tuberosity decubitus ulcer with underlying osteomyelitis. Foley catheter changed this admission. Blood cultures 2 negative to date. Persistent left lower lobe consolidation on chest x-ray but no respiratory symptoms. Recently treated for MSSA bacteremia. Recent TEE was negative. Wound care consultation appreciated. Continue IV vancomycin. Aztreonam discontinued by ID on 12/26. No fevers. Pelvic MRI results appreciated. Status post debridement of decubitus ulcer on 12/28. As per ID, IV vancomycin for 6-8 weeks. PICC line placed 12/28. As per CCS discussion with Ortho, no further Ortho intervention needed. 2. Acute encephalopathy: Secondary to problem #1. Resolved. No focal deficits. 3. Acute kidney injury: Related to sepsis and dehydration. Resolved. 4. Type II DM/IDDM: Continue Lantus and SSI and adjust as needed. Will adjust insulins for uncontrolled DM 2. 5. History of severe protein calorie malnutrition: Continue nutritional supplements. 6. Sacral  decubitus ulcer/pelvic OM: Wound care consultation on 12/24 appreciated. Patient has multiple sacral/buttock stage III ulcers, right ischial stage IV ulcer, left foot with several full-thickness wounds, fifth toe and fourth toe wound. Management per wound care team. Status post right ischemic tuberosity decubitus ulcer debridement 12/28. 7. History of GERD: Continue PPI. 8. History of spinal cord infarction and quadriparesis: Paraplegic in lower extremities and weak in the upper extremities. Patient has had an indwelling Foley catheter since April 2015. 9. Dehydration with hyponatremia: resolved. DC IVF 10. Leukocytosis: Secondary to problem #1. Follow CBCs. Improving. 11. History of recently treated MSSA bacteremia: Continue above antibiotics and follow blood culture results. 12. Hypertension: Soft blood pressures. Hold antihypertensives. 13. Chronic benzodiazepine use: Continue same. 14. Chronic Foley catheter and traumatic hypospadias: Patient states that urology had consulted in the past and recommended suprapubic catheter but he was not medically stable to get it at that time. Discussed with urologist on call Dr. Era Bumpers on 12/24 would recommended looping the Foley catheter and taping it to abdomen to reduce gravity related further trauma to penis and decreasing the chance of urethral stricture. He also recommended Neosporin ointment or the junction of the Foley catheter in the penis and outpatient follow-up with urology to evaluate whether he would benefit from a suprapubic catheter. As per mother, follows with Dr. [facility] 15. Acute on chronic Anemia: Likely secondary to acute illness and dilutional. No reported bleeding. Stable   Code Status:  Full Family Communication:  Discussed with fianc at bedside. Disposition Plan:  Not medically stable for discharge.   Consultants:  Infectious disease  General surgery  Procedures:   Changed Foley catheter  Debridement of  decubitus ulcer 12/28  PICC line placed 12/28  Antibiotics:   IV vancomycin and 12/23 >  IV aztreonam 12/23 > 12/26  Subjective: States that he does  not "like morphine" and requests that this be changed to Dilaudid as his pain is not currently controlled. Denies any other complaints.  Objective: Filed Vitals:   10/04/14 1533 10/04/14 2229 10/05/14 0508 10/05/14 1322  BP: 95/69 137/87 114/74 108/85  Pulse: 71 79 78 80  Temp: 97.4 F (36.3 C) 98.3 F (36.8 C) 99.4 F (37.4 C) 99.6 F (37.6 C)  TempSrc: Oral Oral Oral Oral  Resp: 16 12 12 20   Height:      Weight:      SpO2: 100% 100% 100% 100%    Intake/Output Summary (Last 24 hours) at 10/05/14 1638 Last data filed at 10/05/14 1534  Gross per 24 hour  Intake    819 ml  Output   2625 ml  Net  -1806 ml   Filed Weights   10/01/14 0408 10/02/14 0455 10/02/14 1841  Weight: 55.4 kg (122 lb 2.2 oz) 55.3 kg (121 lb 14.6 oz) 57 kg (125 lb 10.6 oz)     Exam:  General exam:  Moderately built and thinly nourished chronically ill-looking young male lying comfortably in bed.  Respiratory system: Clear. No increased work of breathing. Cardiovascular system: S1 & S2 heard, RRR. No JVD, murmurs, gallops, clicks or pedal edema. Gastrointestinal system: Abdomen is nondistended, soft and nontender. Normal bowel sounds heard. Central nervous system: Alert and oriented. No focal neurological deficits. Extremities:  Grade 0 x 5 power in lower extremities and grade 3 x 5 power in upper extremities proximally and 0 x 5 power distally with contractures of fingers. Skin: Decubitus/surgical site dressing clean and dry.   Data Reviewed: Basic Metabolic Panel:  Recent Labs Lab 09/29/14 1221 09/29/14 1916 09/30/14 0411 10/01/14 0345 10/02/14 0334 10/05/14 0500  NA 131*  --  137 131* 133* 131*  K 4.6  --  4.0 4.2 4.0 4.3  CL 104  --  110 106 105 101  CO2 17*  --  20 20 22 26   GLUCOSE 338*  --  132* 221* 173* 270*  BUN 39*  --  24*  11 10 13   CREATININE 1.47*  --  0.80 0.65 0.60 0.62  CALCIUM 9.2  --  9.6 9.1 9.4 9.1  MG  --  1.5  --   --   --   --   PHOS  --  3.2  --   --   --   --    Liver Function Tests:  Recent Labs Lab 09/29/14 1221 10/05/14 0500  AST 11 12  ALT 11 8  ALKPHOS 76 87  BILITOT 0.5 0.2*  PROT 7.7 6.8  ALBUMIN 2.5* 2.1*   No results for input(s): LIPASE, AMYLASE in the last 168 hours. No results for input(s): AMMONIA in the last 168 hours. CBC:  Recent Labs Lab 09/29/14 1221 09/30/14 0411 10/01/14 0345 10/02/14 0334 10/03/14 0717 10/05/14 0500  WBC 27.2* 25.6* 16.5* 15.2* 13.7* 12.5*  NEUTROABS 20.1*  --   --   --   --   --   HGB 9.6* 9.5* 8.9* 8.5* 9.4* 8.9*  HCT 29.7* 29.2* 27.7* 26.5* 28.9* 28.0*  MCV 83.9 82.5 82.2 81.8 81.2 81.9  PLT 477* 496* 479* 567* 597* 718*   Cardiac Enzymes: No results for input(s): CKTOTAL, CKMB, CKMBINDEX, TROPONINI in the last 168 hours. BNP (last 3 results)  Recent Labs  01/29/14 2113  PROBNP 1343.0*   CBG:  Recent Labs Lab 10/04/14 1436 10/04/14 1659 10/04/14 2235 10/05/14 0739 10/05/14 1140  GLUCAP 144* 181* 300*  277* 259*    Recent Results (from the past 240 hour(s))  Blood Culture (routine x 2)     Status: None   Collection Time: 09/29/14 12:16 PM  Result Value Ref Range Status   Specimen Description BLOOD LEFT HAND  Final   Special Requests BOTTLES DRAWN AEROBIC AND ANAEROBIC 5CC  Final   Culture  Setup Time   Final    09/29/2014 18:08 Performed at [facility]    Culture   Final    NO GROWTH 5 DAYS Performed at [facility]    Report Status 10/05/2014 FINAL  Final  Blood Culture (routine x 2)     Status: None   Collection Time: 09/29/14  1:20 PM  Result Value Ref Range Status   Specimen Description BLOOD RIGHT ANTECUBITAL  Final   Special Requests BOTTLES DRAWN AEROBIC AND ANAEROBIC 5ML  Final   Culture  Setup Time   Final    09/29/2014 18:07 Performed at [place]    Final    NO GROWTH 5 DAYS Performed at [facility]    Report Status 10/05/2014 FINAL  Final  Urine culture     Status: None   Collection Time: 09/29/14  6:28 PM  Result Value Ref Range Status   Specimen Description URINE, CLEAN CATCH  Final   Special Requests NONE  Final   Culture  Setup Time   Final    09/30/2014 02:39 Performed at [place] Performed at [facility]   Final   Culture NO GROWTH Performed at [facility]   Final   Report Status 09/30/2014 FINAL  Final  MRSA PCR Screening     Status: None   Collection Time: 10/04/14  3:22 AM  Result Value Ref Range Status   MRSA by PCR NEGATIVE NEGATIVE Final    Comment:        The GeneXpert MRSA Assay (FDA approved for NASAL specimens only), is one component of a comprehensive MRSA colonization surveillance program. It is not intended to diagnose MRSA infection nor to guide or monitor treatment for MRSA infections.   Tissue culture     Status: None (Preliminary result)   Collection Time: 10/04/14  1:46 PM  Result Value Ref Range Status   Specimen Description TISSUE SACRAL  Final   Special Requests NONE  Final   Gram Stain   Final    NO WBC SEEN NO ORGANISMS SEEN Performed at [facility]    Culture NO GROWTH Performed at [facility]   Final   Report Status PENDING  Incomplete  Anaerobic culture     Status: None (Preliminary result)   Collection Time: 10/04/14  1:46 PM  Result Value Ref Range Status   Specimen Description TISSUE SACRAL  Final   Special Requests NONE  Final   Gram Stain   Final    NO WBC SEEN NO ORGANISMS SEEN Performed at [facility]    Culture   Final    NO ANAEROBES ISOLATED; CULTURE IN PROGRESS FOR 5 DAYS Performed at [facility]    Report Status PENDING  Incomplete         Studies: No results found.      Scheduled Meds: . antiseptic oral rinse  7 mL Mouth Rinse BID  .  atorvastatin  20 mg Oral Daily  . bacitracin   Topical BID  . collagenase  Topical Daily  . feeding supplement (GLUCERNA SHAKE)  237 mL Oral TID BM  . heparin  5,000 Units Subcutaneous 3 times per day  . insulin aspart  0-5 Units Subcutaneous QHS  . insulin aspart  0-9 Units Subcutaneous TID WC  . insulin aspart  4 Units Subcutaneous TID WC  . insulin glargine  25 Units Subcutaneous QHS  . loratadine  10 mg Oral Daily  . metoCLOPramide  5 mg Oral Once per day on Mon Thu  . mupirocin cream   Topical BID  . pantoprazole  40 mg Oral Daily  . polyethylene glycol  17 g Oral Daily  . pregabalin  100 mg Oral QID  . sodium chloride  3 mL Intravenous Q12H  . vancomycin  750 mg Intravenous Q12H   Continuous Infusions: . sodium chloride 0.45 % 1,000 mL with potassium chloride 20 mEq infusion 75 mL/hr at 10/05/14 3491    Active Problems:   GERD   Protein-calorie malnutrition, severe   IDDM (insulin dependent diabetes mellitus)   Spinal cord infarction (history of)   Sepsis   UTI (lower urinary tract infection)   Osteomyelitis, pelvis   Cellulitis and abscess of leg, except foot   Abscess   Staphylococcus aureus bacteremia with sepsis    Time spent: 30 minutes.    Vernell Leep, MD, FACP, FHM. [facility] Pager [phone]  If 7PM-7AM, please contact night-coverage [email or web address] Password [facility] 10/05/2014, 4:38 PM    LOS: 6 days

## 2014-10-05 NOTE — Progress Notes (Signed)
Patient ID: Jason [name], male   DOB: [birth year], 28 y.o.   MRN: [id number]   LOS: 6 days   POD#1  Subjective: [place] in pt condition.   Objective: Vital signs in last 24 hours: Temp:  [97.4 F (36.3 C)-99.4 F (37.4 C)] 99.4 F (37.4 C) (12/29 0508) Pulse Rate:  [70-79] 78 (12/29 0508) Resp:  [11-16] 12 (12/29 0508) BP: (95-137)/(63-87) 114/74 mmHg (12/29 0508) SpO2:  [100 %] 100 % (12/29 0508) Last BM Date: 10/04/14   Laboratory  CBC  Recent Labs  10/03/14 0717 10/05/14 0500  WBC 13.7* 12.5*  HGB 9.4* 8.9*  HCT 28.9* 28.0*  PLT 597* 718*   BMET  Recent Labs  10/05/14 0500  NA 131*  K 4.3  CL 101  CO2 26  GLUCOSE 270*  BUN 13  CREATININE 0.62  CALCIUM 9.1    Physical Exam General appearance: alert and no distress Incision/Wound:Packed well, no surrounding erythema   Assessment/Plan: [place] -- WOC consult. Will have ortho consult given bony involvement and also for consideration of ischial tuberosity resection.    Lisette Abu, PA-C Pager: [phone] 10/05/2014

## 2014-10-05 NOTE — Consult Note (Signed)
WOC re-consulted after surgical debridement of the ischial and sacrum 10/04/14.  I have added conservative dressing change orders for now until orthopedic consultation complete pending any further surgery on the ischial wound.  I will follow up after the orthopedic consultation to determine if any other type of topical care needed.   [place] team will follow along with you for weekly wound assessments.  Please notify me of any acute changes in the wounds or any new areas of concerns Para March RN,CWOCN [phone]

## 2014-10-06 DIAGNOSIS — A4101 Sepsis due to Methicillin susceptible Staphylococcus aureus: Secondary | ICD-10-CM

## 2014-10-06 LAB — GLUCOSE, CAPILLARY
GLUCOSE-CAPILLARY: 153 mg/dL — AB (ref 70–99)
GLUCOSE-CAPILLARY: 78 mg/dL (ref 70–99)
GLUCOSE-CAPILLARY: 86 mg/dL (ref 70–99)
Glucose-Capillary: 50 mg/dL — ABNORMAL LOW (ref 70–99)
Glucose-Capillary: 146 mg/dL — ABNORMAL HIGH (ref 70–99)

## 2014-10-06 LAB — CBC
HCT: 26.7 % — ABNORMAL LOW (ref 39.0–52.0)
Hemoglobin: 8.4 g/dL — ABNORMAL LOW (ref 13.0–17.0)
MCH: 25.9 pg — ABNORMAL LOW (ref 26.0–34.0)
MCHC: 31.5 g/dL (ref 30.0–36.0)
MCV: 82.4 fL (ref 78.0–100.0)
Platelets: 690 10*3/uL — ABNORMAL HIGH (ref 150–400)
RBC: 3.24 MIL/uL — AB (ref 4.22–5.81)
RDW: 16 % — ABNORMAL HIGH (ref 11.5–15.5)
WBC: 14.5 10*3/uL — ABNORMAL HIGH (ref 4.0–10.5)

## 2014-10-06 LAB — BASIC METABOLIC PANEL
Anion gap: 4 — ABNORMAL LOW (ref 5–15)
BUN: 14 mg/dL (ref 6–23)
CALCIUM: 8.8 mg/dL (ref 8.4–10.5)
CO2: 26 mmol/L (ref 19–32)
Chloride: 102 mEq/L (ref 96–112)
Creatinine, Ser: 0.61 mg/dL (ref 0.50–1.35)
GFR calc Af Amer: >90 mL/min (ref >90)
GFR calc non Af Amer: >90 mL/min (ref >90)
GLUCOSE: 246 mg/dL — AB (ref 70–99)
Potassium: 5 mmol/L (ref 3.5–5.1)
Sodium: 132 mmol/L — ABNORMAL LOW (ref 135–145)

## 2014-10-06 LAB — CLOSTRIDIUM DIFFICILE BY PCR: Toxigenic C. Difficile by PCR: NEGATIVE

## 2014-10-06 NOTE — Clinical Social Work Psychosocial (Signed)
Clinical Social Work Department BRIEF PSYCHOSOCIAL ASSESSMENT 10/06/2014  Patient:  Jason [name], Jason [name]     Account Number:  [id number]     Admit date:  09/29/2014  Clinical Social Worker:  Lovey Newcomer  Date/Time:  10/06/2014 10:30 AM  Referred by:  Physician  Date Referred:  10/06/2014 Referred for  SNF Placement   Other Referral:   NA   Interview type:  Patient Other interview type:   Patient and patient's mother Jason [name] interviewed.    PSYCHOSOCIAL DATA Living Status:  PARENTS Admitted from facility:   Level of care:   Primary support name:  Jason [name] Primary support relationship to patient:  PARENT Degree of support available:   Support is strong.    CURRENT CONCERNS Current Concerns  Post-Acute Placement   Other Concerns:   NA    SOCIAL WORK ASSESSMENT / PLAN CSW met with patient, patient's mother, and RNCM at bedside. Initially patient and Jason [name] stated that the plan would be for the patient to return home when medically stable with [facility] services. Patient and Jason [name] state that the patient has NEVER been in a SNF, which conflicts with what is documented in the patient's chart. After initial assessment, Jason [name] shared that she does have concerns about taking the patient home again if he is going to require even more assistance after this hospitalization. CSW and RNCM have informed her that the patient could potentially need more assistance with care (wound vac etc.). Jason [name] states that she is capable of a lot of things but would really have to think about whether or not she could manage anything more.    Jason [name] states that the patient has been against any kind of placement in the past and will likely resist SNF placement at discharge. CSW made it clear to Jason [name] that the patient must be agreeable to SNF search/placement in order for CSW to place patient in SNF. CSW will revisit conversation of disposition with patient once his post DC medical needs are more clearly defined. CSW  will only make SNF referrals if patient is agreeable. Patient and mother appear clam and engaged in assessment.   Assessment/plan status:  Psychosocial Support/Ongoing Assessment of Needs Other assessment/ plan:   Complete Fl2, Fax, PASRR if necessary.   Information/referral to community resources:   CSW contact information and SNF list will be given if referrals are made.    PATIENT'S/FAMILY'S RESPONSE TO PLAN OF CARE: Patient and Jason [name] are currently planning for discharge home when medically stable. If patient is agreeable, patient may end up discharging to SNF. CSW will follow up with patient and Jason [name].       Liz Beach MSW, [place], [place], [phone]

## 2014-10-06 NOTE — Consult Note (Addendum)
WOC wound consult note Reason for Consult: reevaluation of the sacral and right ischial wound. post debridement of the right ischial and sacral wound.Ortho is not planning any further surgical intervention at this time.    Wound type: Stage IV Pressure ulcers sacrum and right ischium Pressure Ulcer POA: Yes Measurement:  Sacrum: 7.5cm x 5.0cm x 1.5cm (post hydrotherapy measurements) R ischial: 10cm x 7.0cm x 2cm (post hydrotherapy measurements) Wound bed: sacrum: mostly clean, some fibrinous material present less than 20%; right ischial is 50-60% yellow slough some pink tissue.   Addendum: 1145am Pt seen after hydrotherapy and the right ischial wound is now clean with 20 % yellow in the deepest part of the base of the wound at the bone.  Drainage (amount, consistency, odor) moderate to heavy serosanguinous  Periwound: intact Dressing procedure/placement/frequency: Discussed options for NPWT with CCS.  At this time I do not feel the right ischial wound is clean enough for the VAC.  The sacrum is clean, however the location of these wounds pose a problems if he continues to have frequent stools.  When I discussed this with the patient and his mother, they stated that he has no real bowel program and that he normally has a stool each evening.  The bedside nurse reports he had large stool during the night and at the time of my assessment he has a large stool that has undermined his dressings and stool is present in the right ischial wound.   Questioning whether a diverting ostomy is an option for this patient as he will probably suffer with long term issues with pressure ulcers due to his underlying problems. His mother is very interested in pursuing this if surgery feels he is a good candidate. I have made the surgery team aware of this as well. I discussed the case with PT to determine the avaiablibity to coordinate NPWT VAC placement for the sacrum wound with the hydrotherapy to be performed on the  right ischial wound while we have adequate exposure.  We will coordinate VAC dressings for the sacrum with hydrotherapy on the right ischial wound.    Addendum: 6789 Met with PT at the bedside post hydrotherapy and CSWD.  R ischial wound is now most clean and I feel appropriate to place a VAC dressing in addition to the sacrum.  Dressing procedure/placement/frequency:  After applying skin prep to all the periwound skin I placed drape between the two wound and up to the right trochanter to protect the skin from the bridges between the wounds and away from the wound.  I placed one pc of black foam in each wound with a bridge between the two and a bridge to the right hip. 1/2 of an ostomy barrier ring used at the wound edge nearest the rectum to aid in the seal of the dressing and to protect the dressing from stool. However after a seal was obtained at 136mHG, it is noted that the dressing/drape is only 0.5 cm from the rectum and this patient has no regular bowel pattern at this time.  Additionally he has been placed on Miralax which is causing him to have more frequent stools. I am not sure that the nursing staff will be able to maintain the seal on this dressing and additionally maintaining the seal at home will be challenging.  I will reassess the wound dressing in the am.  I have updated the orders in case [facility].  WOC will follow  along with you for wound care and NPWT VAC dressings M/W/F. Will request PT to perform VAC change on Friday of this week if seal has been maintained during hydrotherapy tx.  Vaishnav Demartin Industry RN,CWOCN [phone]

## 2014-10-06 NOTE — Progress Notes (Signed)
[facility] Progress Note  2 Days Post-Op  Subjective: Pt doing okay, minimal discomfort.  Wounds are looking cleaner.  Mom at bedside asking about diverting colostomy secondary to stool contamination of the wound.  Tolerating diet.   Objective: Vital signs in last 24 hours: Temp:  [98.6 F (37 C)-100 F (37.8 C)] 98.6 F (37 C) (12/30 0646) Pulse Rate:  [80-90] 82 (12/30 0612) Resp:  [15-22] 22 (12/30 0612) BP: (99-108)/(61-85) 99/61 mmHg (12/30 0612) SpO2:  [100 %] 100 % (12/30 0612) Last BM Date: 10/04/14  Intake/Output from previous day: 12/29 0701 - 12/30 0700 In: 2754 [P.O.:1404; I.V.:900; IV Piggyback:450] Out: 1771 [Urine:3850] Intake/Output this shift:    PE: Gen:  Alert, NAD, pleasant Skin:  Right and left ischial decubitus and sacrum, appear pretty clean, right ischium looks like subq fat with some necrosis, moderate sanguinous drainage Abd:  Suprapubic tube in place   Lab Results:   Recent Labs  10/05/14 0500 10/06/14 0513  WBC 12.5* 14.5*  HGB 8.9* 8.4*  HCT 28.0* 26.7*  PLT 718* 690*   BMET  Recent Labs  10/05/14 0500 10/06/14 0513  NA 131* 132*  K 4.3 5.0  CL 101 102  CO2 26 26  GLUCOSE 270* 246*  BUN 13 14  CREATININE 0.62 0.61  CALCIUM 9.1 8.8   PT/INR No results for input(s): LABPROT, INR in the last 72 hours. CMP     Component Value Date/Time   NA 132* 10/06/2014 0513   K 5.0 10/06/2014 0513   CL 102 10/06/2014 0513   CO2 26 10/06/2014 0513   GLUCOSE 246* 10/06/2014 0513   BUN 14 10/06/2014 0513   CREATININE 0.61 10/06/2014 0513   CALCIUM 8.8 10/06/2014 0513   PROT 6.8 10/05/2014 0500   ALBUMIN 2.1* 10/05/2014 0500   AST 12 10/05/2014 0500   ALT 8 10/05/2014 0500   ALKPHOS 87 10/05/2014 0500   BILITOT 0.2* 10/05/2014 0500   GFRNONAA >90 10/06/2014 0513   GFRAA >90 10/06/2014 0513   Lipase     Component Value Date/Time   LIPASE 8* 08/12/2014 1840       Studies/Results: No results  found.  Anti-infectives: Anti-infectives    Start     Dose/Rate Route Frequency Ordered Stop   09/30/14 1400  vancomycin (VANCOCIN) IVPB 1000 mg/200 mL premix  Status:  Discontinued     1,000 mg200 mL/hr over 60 Minutes Intravenous Every 24 hours 09/29/14 1326 09/29/14 1848   09/30/14 1400  vancomycin (VANCOCIN) IVPB 1000 mg/200 mL premix  Status:  Discontinued     1,000 mg200 mL/hr over 60 Minutes Intravenous Every 24 hours 09/29/14 1916 09/30/14 0845   09/30/14 1000  vancomycin (VANCOCIN) IVPB 750 mg/150 ml premix     750 mg150 mL/hr over 60 Minutes Intravenous Every 12 hours 09/30/14 0845     09/29/14 2200  aztreonam (AZACTAM) 1 g in dextrose 5 % 50 mL IVPB  Status:  Discontinued     1 g100 mL/hr over 30 Minutes Intravenous 3 times per day 09/29/14 1326 09/29/14 1848   09/29/14 2200  aztreonam (AZACTAM) 1 g in dextrose 5 % 50 mL IVPB  Status:  Discontinued     1 g100 mL/hr over 30 Minutes Intravenous 3 times per day 09/29/14 1916 10/01/14 1426   09/29/14 1300  aztreonam (AZACTAM) 2 g in dextrose 5 % 50 mL IVPB     2 g100 mL/hr over 30 Minutes Intravenous STAT 09/29/14 1239 09/29/14 1355   09/29/14 1245  vancomycin (VANCOCIN) IVPB 1000 mg/200 mL premix     1,000 mg200 mL/hr over 60 Minutes Intravenous  Once 09/29/14 1239 09/29/14 1530       Assessment/Plan Ischial and sacral decubitus ulcers POD #2 s/p debridement of decubitus ulcer and bone biopsy right ischium Staphococcus aureus bacteremia Leukocytosis - 14.5  Plan: 1.  WOC following 2.  Ortho contacted and Dr. Mardelle Matte said no further Ortho interventions needed 3.  ID following, continue IV antibiotics per their recommendations - IV Vancomycin  6-8 weeks post debridement 4.  Bone culture no growth to date 5.  Will discuss with Dr. Grandville Silos, [place]  has recommended diverting colostomy secondary to stool contamination in the wound.  Discussed this may need to be permanent.  Unsure on timing since he has staph bacteremia.  The mother  also mentions Dr. Matilde Sprang is also following for permanent suprapubic catheter at some point, she wonders if this could be coordinated.    LOS: 7 days    [name] 10/06/2014, 9:50 AM Pager: [phone]

## 2014-10-06 NOTE — Progress Notes (Signed)
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 4cm x 6 cm  necrotic (Active)  Dressing Type Moist to dry 10/01/2014  8:00 AM  Dressing Clean;Dry;Intact 10/01/2014  8:00 AM  Dressing Change Frequency Daily 10/01/2014  8:00 AM  State of Healing Eschar 10/01/2014 11:00 AM  Site / Wound Assessment Black;Pink 10/01/2014 11:00 AM  % Wound base Red or Granulating 95% 10/01/2014 11:00 AM  % Wound base Yellow 0% 10/01/2014 11:00 AM  % Wound base Black 5% 10/01/2014 11:00 AM  Peri-wound Assessment Intact;Pink 10/01/2014 11:00 AM  Margins Unattached edges (unapproximated) 10/01/2014 11:00 AM  Drainage Amount Scant 10/01/2014 11:00 AM  Drainage Description Purulent 09/23/2014 11:00 AM  Treatment Debridement (Selective) 10/01/2014 11:00 AM     Pressure Ulcer 08/13/14 Stage III -  Full thickness tissue loss. Subcutaneous fat may be visible but bone, tendon or muscle are NOT exposed. sacrum and bilat buttocks (Active)  Dressing Type Foam;Gauze (Comment) 10/01/2014 11:00 AM  Dressing Changed 10/01/2014 11:00 AM  Dressing Change Frequency Daily 10/01/2014 11:00 AM  State of Healing Non-healing 09/22/2014  8:00 AM  Site / Wound Assessment Red;Yellow 10/01/2014 11:00 AM  % Wound base Red or Granulating 85% 10/01/2014 11:00 AM  % Wound base Yellow 15% 10/01/2014 11:00 AM  Peri-wound Assessment Intact;Pink 10/01/2014 11:00 AM  Margins Unattached edges (unapproximated) 10/01/2014 11:00 AM  Drainage Amount None 10/01/2014 11:00 AM  Drainage Description Serous 09/23/2014 11:00 AM  Treatment Cleansed  10/01/2014 11:00 AM     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 (Active)  Dressing Type ABD;Moist to dry 10/06/2014  2:28 AM  Dressing Intact;Clean;Dry;Changed 10/06/2014  2:28 AM  Dressing Change Frequency Twice a day 10/06/2014  2:28 AM  Site / Wound Assessment Bleeding;Yellow;Pink 10/06/2014  2:28 AM  % Wound base Red or Granulating 40% 10/06/2014  2:28 AM  % Wound base Yellow 50% 10/06/2014  2:28 AM  % Wound base Black 10% 10/06/2014  2:28 AM  Peri-wound Assessment Bleeding 10/06/2014  2:28 AM  Wound Length (cm) 5.5 cm 10/06/2014  2:28 AM  Wound Width (cm) 7 cm 10/06/2014  2:28 AM  Wound Depth (cm) 1.5 cm 10/06/2014  2:28 AM  Margins Unattached edges (unapproximated) 10/06/2014  2:28 AM  Drainage Amount Scant 10/06/2014  2:28 AM     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 (Active)  Dressing Type ABD;Moist to dry 10/06/2014  2:28 AM  Dressing Clean;Dry;Intact;Changed 10/06/2014 12:36 PM  Dressing Change Frequency Monday, Wednesday, Friday 10/06/2014 12:36 PM  Site / Wound Assessment Red;Pink;Yellow;Bleeding 10/06/2014 12:36 PM  % Wound base Red or Granulating 70% 10/06/2014 12:36 PM  % Wound base Yellow 5% 10/06/2014 12:36 PM  % Wound base Other (Comment) 25% 10/06/2014 12:36 PM  Peri-wound Assessment Erythema (blanchable) 10/06/2014 12:36 PM  Wound Length (cm) 10 cm 10/06/2014 12:36 PM  Wound Width (cm) 7 cm 10/06/2014 12:36 PM  Wound Depth (cm) 2 cm 10/06/2014 12:36 PM  Tunneling (cm) 3.6 10/06/2014  2:28 AM  Margins Unattached edges (unapproximated)  10/06/2014 12:36 PM  Drainage Amount Minimal 10/06/2014 12:36 PM  Drainage Description Serous 10/06/2014 12:36 PM  Treatment Cleansed;Debridement (Selective);Hydrotherapy (Pulse lavage) 10/06/2014 12:36 PM     Wound / Incision (Open or Dehisced) 08/20/14 Other (Comment) Finger (Comment which one) Left middle,  eschar looking wound (Active)  Dressing Type None 09/24/2014  8:35 AM  Dressing Status None 09/22/2014  3:30 AM  Site / Wound Assessment Dry;Red 09/24/2014  8:35 AM  Peri-wound Assessment Intact 09/19/2014  9:51 PM  Margins Unattached edges (unapproximated) 09/21/2014  8:00 AM  Closure None 09/21/2014  8:00 AM  Drainage Amount None 09/21/2014  8:00 AM  Treatment Cleansed 09/16/2014  8:00 PM     Wound / Incision (Open or Dehisced) 09/16/14 Other (Comment) Toe (Comment  which one) Left (Active)  Dressing Type Gauze (Comment) 10/01/2014  8:00 AM  Dressing Changed Changed 09/30/2014  8:16 PM  Dressing Status New drainage 10/01/2014  8:00 AM  Dressing Change Frequency Daily 10/01/2014  8:00 AM  Site / Wound Assessment Dressing in place / Unable to assess 10/06/2014  7:55 AM  % Wound base Red or Granulating 95% 10/01/2014  8:00 AM  Peri-wound Assessment Pink 09/23/2014 11:00 AM  Margins Unattached edges (unapproximated) 10/01/2014  8:00 AM  Drainage Amount Scant 10/01/2014  8:00 AM  Drainage Description Serosanguineous 10/01/2014  8:00 AM  Non-staged Wound Description Partial thickness 10/01/2014  8:00 AM  Treatment Cleansed 09/30/2014  8:16 PM     Wound / Incision (Open or Dehisced) 09/30/14 Other (Comment) Sacrum Medial Reddened , Excoriated (Active)  Dressing Type Foam 10/04/2014  8:40 AM  Site / Wound Assessment Dressing in place / Unable to assess 10/06/2014  7:55 AM     Incision (Closed) 10/04/14 Buttocks Other (Comment) (Active)  Dressing Type Gauze (Comment) 10/06/2014  7:55 AM  Dressing Intact;Clean;Dry 10/06/2014  7:55 AM  Site / Wound Assessment Dressing in place / Unable to assess 10/06/2014  7:55 AM  Drainage Amount None 10/04/2014  3:15 PM   Hydrotherapy Pulsed lavage therapy - wound location: L ischium Pulsed Lavage with Suction (psi): 4 psi (to 8) Pulsed Lavage with Suction - Normal Saline Used: 1000 mL Pulsed Lavage Tip: Tip with splash shield Selective  Debridement Selective Debridement - Location: L ischium Selective Debridement - Tools Used: Forceps;Scalpel;Scissors Selective Debridement - Tissue Removed: necrotic muscle and connective tissues   Wound Assessment and Plan  Wound Therapy - Assess/Plan/Recommendations Wound Therapy - Clinical Statement: Pt benefited from PLS to soften tissues for selective debridement and to cleanse and hopefully decrease bacterial load. Wound Therapy - Functional Problem List: quadriplegia Factors Delaying/Impairing Wound Healing: Incontinence;Infection - systemic/local;Immobility Hydrotherapy Plan: Debridement;Dressing change;Patient/family education;Pulsatile lavage with suction Wound Therapy - Frequency: 3X / week Wound Plan: see above  Wound Therapy Goals- Improve the function of patient's integumentary system by progressing the wound(s) through the phases of wound healing (inflammation - proliferation - remodeling) by: Decrease Necrotic Tissue to: 0% Decrease Necrotic Tissue - Progress: Goal set today Increase Granulation Tissue to: 100% including any healthy connective tissue Increase Granulation Tissue - Progress: Goal set today Goals/treatment plan/discharge plan were made with and agreed upon by patient/family: Yes Time For Goal Achievement: 7 days Wound Therapy - Potential for Goals: Good  Goals will be updated until maximal potential achieved or discharge criteria met.  Discharge criteria: when goals achieved, discharge from hospital, MD decision/surgical intervention, no progress towards goals, refusal/missing three consecutive treatments without notification or medical reason.  GP     Dreanna Kyllo, [name]  10/06/2014, 12:47 PM 10/06/2014  Donnella Sham, [place] [phone]  (pager)

## 2014-10-06 NOTE — Progress Notes (Signed)
CARE MANAGEMENT NOTE 10/06/2014  Patient:  ODAS, OZER   Account Number:  [id number]  Date Initiated:  10/06/2014  Documentation initiated by:  [facility]  Subjective/Objective Assessment:   decubitis wounds     Action/Plan:   Anticipated DC Date:     Anticipated DC Plan:  [facility] referral  Clinical Social Worker      DC Forensic scientist  CM consult      [facility] Choice  HOME HEALTH   Choice offered to / List presented to:  C-6 Parent           Status of service:  In process, will continue to follow Medicare Important Message given?   (If response is "NO", the following Medicare IM given date fields will be blank) Date Medicare IM given:   Medicare IM given by:   Date Additional Medicare IM given:   Additional Medicare IM given by:    Discharge Disposition:    Per UR Regulation:    If discussed at Long Length of Stay Meetings, dates discussed:    Comments:  10/06/2014 1030 NCM spoke to pt and gave permission to speak to Mother, Geroge Gilliam [phone]. Mother requesting [facility] RN and IV meds if pt dc home. States she had issues with getting pt IV meds on schedule. Pt has a hospital bed at home but states she will need a alternating pressure mattress pad. Waiting final recommendations for home. Pt will possible need wound vac for home. CSW following for SNF placement. Jonnie Finner RN CCM Case Mgmt phone [phone]

## 2014-10-06 NOTE — Progress Notes (Signed)
Pt had several large and loose BMS today. The wound vac lost its seal with the first BM after it was applied. Per orders, we removed the wound vac and redressed the wound with wet to dry dressing. Cdiff test was negative.

## 2014-10-06 NOTE — Plan of Care (Signed)
Problem: Phase II Progression Outcomes Goal: Obtain order to discontinue catheter if appropriate Outcome: Not Met (add Reason) Patient's foley catheter is chronic.
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
## 2014-10-06 NOTE — Clinical Social Work Note (Signed)
CSW met with patient, patient's mother, and RNCM at bedside to start discussion of disposition planning. Patient and mother have decided that patient will go home with [facility] services. Patient and mother confirm that PATIENT HAS NEVER BEEN IN [place], which conflicts with what is documented in the patient's chart. The patient has strong support at home. CSW signing off at this time.   Liz Beach MSW, [place], [place], [phone]

## 2014-10-06 NOTE — Progress Notes (Signed)
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  Now 2 weeks off vancomycin, chronic indwelling Foley catheter, not on home oxygen, presented to ED with altered mental status/somnolence, leukocytosis, fever and chills. Infectious disease consulting. Patient sepsis attributed to right ischial tuberosity decubitus ulcer. Surgery consulted and patient underwent debridement and bone biopsy for osteomyelitis on 12/28. Bone biopsy results pending. PICC line placed. ID recommending 6-8 weeks of IV vancomycin.   Subjective: Mother discusses diverting ostomy and pain control.  Does not want patient over sedated.  Assessment/Plan:  Sepsis/fever:  Likely from infected right ischial tuberosity decubitus ulcer with underlying osteomyelitis. Foley catheter changed this admission. Blood cultures 2 negative to date. Persistent left lower lobe consolidation on chest x-ray but no respiratory symptoms. Recently treated for MSSA bacteremia. Recent TEE was negative. Wound care consultation appreciated. Continue IV vancomycin. Aztreonam discontinued by ID on 12/26. No fevers.  Sacral decubitus ulcer/pelvic OM:  MRI shows osteomyelitis of right inferior pubic [name], but no osteo in smaller left distal tuberosity.  Status post right decubitus ulcer debridement 12/28. Patient has multiple sacral/buttock stage III ulcers, right ischial stage IV ulcer, left foot with several full-thickness wounds, fifth toe and fourth toe wound. Management per wound care team. WOC has recommended hydrotherapy and wound vacs as appropriate.  Surgery to consider diverting ostomy  Acute encephalopathy: Secondary to problem #1. Resolved. No focal deficits.  Acute kidney injury: Related  to sepsis and dehydration. Resolved.  Type II DM/IDDM: Continue Lantus and SSI and adjust as needed. Will adjust insulins for uncontrolled DM 2.  History of severe protein calorie malnutrition: Continue nutritional supplements.  History of GERD: Continue PPI.  History of spinal cord infarction and quadriparesis: Paraplegic in lower extremities and weak in the upper extremities. Patient has had an indwelling Foley catheter since April 2015.  Dehydration with hyponatremia: resolved. DC IVF  Leukocytosis: Secondary to problem #1. Follow CBCs. Improving.  History of recently treated MSSA bacteremia: Continue above antibiotics and follow blood culture results.  Hypertension: Soft blood pressures. Hold antihypertensives.  Chronic benzodiazepine use: Continue same.  Chronic Foley catheter and traumatic hypospadias: Patient states that urology had consulted in the past and recommended suprapubic catheter but he was not medically stable to get it at that time. Discussed with urologist on call Jason [name] on 12/24 would recommended looping the Foley catheter and taping it to abdomen to reduce gravity related further trauma to penis and decreasing the chance of urethral stricture. He also recommended Neosporin ointment or the junction of the Foley catheter in the penis and outpatient follow-up with urology to evaluate whether he would benefit from a suprapubic catheter. As per mother, follows with [name]  Acute on chronic Anemia: Likely secondary to acute illness and dilutional. No reported bleeding. Stable  Code Status:  Full Family Communication:  Discussed with mother Disposition Plan:  Inpatient. D/c to home with full home health services when ready.  Consultants:  Infectious disease  General surgery  Procedures:  Changed Foley catheter  Debridement of decubitus ulcer 12/28  PICC line placed 12/28  Antibiotics:  IV vancomycin and 12/23 >  IV aztreonam 12/23 >  12/26  Objective: Filed Vitals:   10/05/14 1322 10/05/14 2245 10/06/14 0612 10/06/14 0646  BP: 108/85 108/68 99/61   Pulse: 80  90 82   Temp: 99.6 F (37.6 C) 100 F (37.8 C)  98.6 F (37 C)  TempSrc: Oral Oral  Oral  Resp: 20 15 22    Height:      Weight:      SpO2: 100% 100% 100%     Intake/Output Summary (Last 24 hours) at 10/06/14 1318 Last data filed at 10/06/14 4098  Gross per 24 hour  Intake   1944 ml  Output   3500 ml  Net  -1556 ml   Filed Weights   10/01/14 0408 10/02/14 0455 10/02/14 1841  Weight: 55.4 kg (122 lb 2.2 oz) 55.3 kg (121 lb 14.6 oz) 57 kg (125 lb 10.6 oz)     Exam:  General exam:   thinly nourished chronically ill-looking young male lying comfortably in bed.  Respiratory system: Clear. No increased work of breathing. Cardiovascular system: S1 & S2 heard, RRR. No JVD, murmurs, gallops, clicks or pedal edema. Gastrointestinal system: Abdomen is nondistended, soft and nontender. Normal bowel sounds heard. Central nervous system: Alert and oriented. No new focal deficits.   Extremities:  Grade 0 x 5 power in lower extremities and grade 3 x 5 power in upper extremities proximally and 0 x 5 power distally with contractures of fingers. Skin: Decubitus/surgical site dressing clean and dry.   Data Reviewed: Basic Metabolic Panel:  Recent Labs Lab 09/29/14 1916 09/30/14 0411 10/01/14 0345 10/02/14 0334 10/05/14 0500 10/06/14 0513  NA  --  137 131* 133* 131* 132*  K  --  4.0 4.2 4.0 4.3 5.0  CL  --  110 106 105 101 102  CO2  --  20 20 22 26 26   GLUCOSE  --  132* 221* 173* 270* 246*  BUN  --  24* 11 10 13 14   CREATININE  --  0.80 0.65 0.60 0.62 0.61  CALCIUM  --  9.6 9.1 9.4 9.1 8.8  MG 1.5  --   --   --   --   --   PHOS 3.2  --   --   --   --   --    Liver Function Tests:  Recent Labs Lab 10/05/14 0500  AST 12  ALT 8  ALKPHOS 87  BILITOT 0.2*  PROT 6.8  ALBUMIN 2.1*    CBC:  Recent Labs Lab 10/01/14 0345 10/02/14 0334  10/03/14 0717 10/05/14 0500 10/06/14 0513  WBC 16.5* 15.2* 13.7* 12.5* 14.5*  HGB 8.9* 8.5* 9.4* 8.9* 8.4*  HCT 27.7* 26.5* 28.9* 28.0* 26.7*  MCV 82.2 81.8 81.2 81.9 82.4  PLT 479* 567* 597* 718* 690*   BNP (last 3 results)  Recent Labs  01/29/14 2113  PROBNP 1343.0*   CBG:  Recent Labs Lab 10/05/14 1140 10/05/14 1701 10/05/14 2241 10/06/14 0742 10/06/14 1201  GLUCAP 259* 164* 207* 153* 78    Recent Results (from the past 240 hour(s))  Blood Culture (routine x 2)     Status: None   Collection Time: 09/29/14 12:16 PM  Result Value Ref Range Status   Specimen Description BLOOD LEFT HAND  Final   Special Requests BOTTLES DRAWN AEROBIC AND ANAEROBIC 5CC  Final   Culture  Setup Time   Final    09/29/2014 18:08 Performed at [facility]    Culture   Final    NO GROWTH 5 DAYS Performed at [facility]    Report Status 10/05/2014 FINAL  Final  Blood Culture (routine x 2)     Status: None  Collection Time: 09/29/14  1:20 PM  Result Value Ref Range Status   Specimen Description BLOOD RIGHT ANTECUBITAL  Final   Special Requests BOTTLES DRAWN AEROBIC AND ANAEROBIC 5ML  Final   Culture  Setup Time   Final    09/29/2014 18:07 Performed at [place]   Final    NO GROWTH 5 DAYS Performed at [facility]    Report Status 10/05/2014 FINAL  Final  Urine culture     Status: None   Collection Time: 09/29/14  6:28 PM  Result Value Ref Range Status   Specimen Description URINE, CLEAN CATCH  Final   Special Requests NONE  Final   Culture  Setup Time   Final    09/30/2014 02:39 Performed at [place] Performed at [facility]   Final   Culture NO GROWTH Performed at [facility]   Final   Report Status 09/30/2014 FINAL  Final  MRSA PCR Screening     Status: None   Collection Time: 10/04/14  3:22 AM  Result Value Ref Range Status   MRSA by PCR NEGATIVE NEGATIVE Final     Comment:        The GeneXpert MRSA Assay (FDA approved for NASAL specimens only), is one component of a comprehensive MRSA colonization surveillance program. It is not intended to diagnose MRSA infection nor to guide or monitor treatment for MRSA infections.   Tissue culture     Status: None (Preliminary result)   Collection Time: 10/04/14  1:46 PM  Result Value Ref Range Status   Specimen Description TISSUE SACRAL  Final   Special Requests NONE  Final   Gram Stain   Final    NO WBC SEEN NO ORGANISMS SEEN Performed at [facility]    Culture   Final    Culture reincubated for better growth Performed at [facility]    Report Status PENDING  Incomplete  Anaerobic culture     Status: None (Preliminary result)   Collection Time: 10/04/14  1:46 PM  Result Value Ref Range Status   Specimen Description TISSUE SACRAL  Final   Special Requests NONE  Final   Gram Stain   Final    NO WBC SEEN NO ORGANISMS SEEN Performed at [facility]    Culture   Final    NO ANAEROBES ISOLATED; CULTURE IN PROGRESS FOR 5 DAYS Performed at [facility]    Report Status PENDING  Incomplete         Studies: No results found.      Scheduled Meds: . antiseptic oral rinse  7 mL Mouth Rinse BID  . atorvastatin  20 mg Oral Daily  . bacitracin   Topical BID  . collagenase   Topical Daily  . feeding supplement (GLUCERNA SHAKE)  237 mL Oral TID BM  . heparin  5,000 Units Subcutaneous 3 times per day  . insulin aspart  0-15 Units Subcutaneous TID WC  . insulin aspart  0-5 Units Subcutaneous QHS  . insulin aspart  6 Units Subcutaneous TID WC  . insulin glargine  30 Units Subcutaneous QHS  . loratadine  10 mg Oral Daily  . metoCLOPramide  5 mg Oral Once per day on Mon Thu  . mupirocin cream   Topical BID  . pantoprazole  40 mg Oral Daily  . polyethylene glycol  17 g Oral Daily  . pregabalin  100 mg Oral QID  . sodium chloride  3 mL Intravenous Q12H    . vancomycin  750 mg Intravenous Q12H   Continuous Infusions:    Principal Problem:   Osteomyelitis, pelvis Active Problems:   GERD   Protein-calorie malnutrition, severe   IDDM (insulin dependent diabetes mellitus)   Spinal cord infarction (history of)   Sepsis   UTI (lower urinary tract infection)   Cellulitis and abscess of leg, except foot   Abscess   Staphylococcus aureus bacteremia with sepsis   AKI (acute kidney injury)  Karen Kitchens [facility] Pager [phone]  If 7PM-7AM, please contact night-coverage [email or web address] Password [email or web address] 10/06/2014, 1:18 PM    LOS: 7 days   Patient was seen, examined,treatment plan was discussed with the Advance Practice Provider.  I have directly reviewed the clinical findings, lab, imaging studies and management of this patient in detail. I have made the necessary changes to the above noted documentation, and agree with the documentation, as recorded by the Advance Practice Provider.    Marzetta Board, MD [facility] [phone]

## 2014-10-07 LAB — CBC
HEMATOCRIT: 27 % — AB (ref 39.0–52.0)
HEMOGLOBIN: 8.5 g/dL — AB (ref 13.0–17.0)
MCH: 26.6 pg (ref 26.0–34.0)
MCHC: 31.5 g/dL (ref 30.0–36.0)
MCV: 84.6 fL (ref 78.0–100.0)
Platelets: 712 10*3/uL — ABNORMAL HIGH (ref 150–400)
RBC: 3.19 MIL/uL — AB (ref 4.22–5.81)
RDW: 16.3 % — ABNORMAL HIGH (ref 11.5–15.5)
WBC: 12 10*3/uL — ABNORMAL HIGH (ref 4.0–10.5)

## 2014-10-07 LAB — GLUCOSE, CAPILLARY
Glucose-Capillary: 154 mg/dL — ABNORMAL HIGH (ref 70–99)
Glucose-Capillary: 85 mg/dL (ref 70–99)
Glucose-Capillary: 105 mg/dL — ABNORMAL HIGH (ref 70–99)
Glucose-Capillary: 254 mg/dL — ABNORMAL HIGH (ref 70–99)

## 2014-10-07 LAB — BASIC METABOLIC PANEL
Anion gap: 6 (ref 5–15)
BUN: 11 mg/dL (ref 6–23)
CHLORIDE: 102 meq/L (ref 96–112)
CO2: 25 mmol/L (ref 19–32)
CREATININE: 0.62 mg/dL (ref 0.50–1.35)
Calcium: 9.5 mg/dL (ref 8.4–10.5)
GFR calc non Af Amer: >90 mL/min (ref >90)
Glucose, Bld: 195 mg/dL — ABNORMAL HIGH (ref 70–99)
POTASSIUM: 5.3 mmol/L — AB (ref 3.5–5.1)
Sodium: 133 mmol/L — ABNORMAL LOW (ref 135–145)

## 2014-10-07 LAB — TISSUE CULTURE: Gram Stain: NONE SEEN

## 2014-10-07 LAB — VANCOMYCIN, TROUGH: Vancomycin Tr: 24 ug/mL — ABNORMAL HIGH (ref 10.0–20.0)

## 2014-10-07 MED ORDER — INSULIN GLARGINE 100 UNIT/ML ~~LOC~~ SOLN
26.0000 [IU] | Freq: Every day | SUBCUTANEOUS | Status: DC
  Administered 2014-10-07 – 2014-10-13 (×6): 26 [IU] via SUBCUTANEOUS
  Filled 2014-10-07 (×7): qty 0.26

## 2014-10-07 MED ORDER — VANCOMYCIN HCL 500 MG IV SOLR
500.0000 mg | Freq: Two times a day (BID) | INTRAVENOUS | Status: DC
  Administered 2014-10-08 – 2014-10-13 (×11): 500 mg via INTRAVENOUS
  Filled 2014-10-07 (×13): qty 500

## 2014-10-07 MED ORDER — SODIUM POLYSTYRENE SULFONATE 15 GM/60ML PO SUSP
30.0000 g | Freq: Once | ORAL | Status: AC
  Administered 2014-10-07: 30 g via ORAL
  Filled 2014-10-07: qty 120

## 2014-10-07 NOTE — Care Management Note (Unsigned)
    Page 1 of 2   10/13/2014     1:48:22 PM CARE MANAGEMENT NOTE 10/13/2014  Patient:  Jason [name], Jason [name]   Account Number:  [id number]  Date Initiated:  10/06/2014  Documentation initiated by:  [facility]  Subjective/Objective Assessment:   decubitis wounds  patient is active with [name], and [name] is providing [place] for IV ABX.     Action/Plan:   will need wound vac   Anticipated DC Date:  10/14/2014   Anticipated DC Plan:  [place]  In-house referral  Clinical Social Worker      DC Forensic scientist  CM consult      [facility] Choice  HOME HEALTH   Choice offered to / List presented to:  C-6 Parent   DME arranged  [place]      DME agency  [place] arranged  HH-1 RN      [place]   Status of service:  In process, will continue to follow Medicare Important Message given?  NO (If response is "NO", the following Medicare IM given date fields will be blank) Date Medicare IM given:   Medicare IM given by:   Date Additional Medicare IM given:   Additional Medicare IM given by:    Discharge Disposition:    Per UR Regulation:  Reviewed for med. necessity/level of care/duration of stay  If discussed at [place] of Stay Meetings, dates discussed:    Comments:  10/13/14 [place], BSN [phone] plan is go go home per patient and  family member.  Patient is active with [name] and [name]-( for abx) .  Patient will need ostomy care for new ostomy and wound vac and patient has new suprpubic catherter as well with diverting ostomy. NCM will fax information to [facility].  10-07-14 1438 Jacqlyn Krauss, RN,BSN [phone] CM will continue to monitor for disposition needs. Plan for diverting laparoscopic loop colostomy. Referral for [name] services placed: however family wishes to decide on plan of care after procedure. Possible SNF vs home.   10/06/2014 1030 NCM spoke to pt and gave permission  to speak to Mother, Jason [name] [phone]. Mother requesting [facility] for [facility] RN and IV meds if pt dc home. States she had issues with getting pt IV meds on schedule. Pt has a hospital bed at home but states she will need a alternating pressure mattress pad. Waiting final recommendations for home. Pt will possible need wound vac for home. CSW following for SNF placement. Jonnie Finner RN CCM Case Mgmt phone [phone]

## 2014-10-07 NOTE — Progress Notes (Signed)
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  Now 2 weeks off vancomycin, chronic indwelling Foley catheter, not on home oxygen, presented to ED with altered mental status/somnolence, leukocytosis, fever and chills. Infectious disease consulting. Patient sepsis attributed to right ischial tuberosity decubitus ulcer. Surgery consulted and patient underwent debridement and bone biopsy for osteomyelitis on 12/28. Bone biopsy results pending. PICC line placed. ID recommending 6-8 weeks of IV vancomycin.   Subjective: Patient sleeping soundly with ear phones on.  I did not wake.  According to his mother he was up late talking with his girlfriend on the phone. - seen again later, alert and eating breakfast   Assessment/Plan:  Sepsis/fever:  Likely from infected right ischial tuberosity decubitus ulcer with underlying osteomyelitis. Foley catheter changed this admission. Blood cultures 2 negative to date. Persistent left lower lobe consolidation on chest x-ray but no respiratory symptoms. Recently treated for MSSA bacteremia. Recent TEE was negative. Wound care consultation appreciated. Continue IV vancomycin. Aztreonam discontinued by ID on 12/26. No fevers.  Sacral decubitus ulcer/pelvic OM:  MRI shows osteomyelitis of right inferior pubic ramus, but no osteo in smaller left distal tuberosity.  Status post right decubitus ulcer debridement 12/28. Patient has multiple sacral/buttock stage III ulcers, right ischial stage IV ulcer, left foot with several full-thickness wounds, fifth toe and fourth toe wound. Management per wound care team. WOC has recommended hydrotherapy and wound vacs as appropriate.  Surgery planning diverting ostomy for next  week.  Pathology results are below.  Mild hyperkalemia  Potassium trending up despite good kidney function.  Will give one dose of kayexalate.  Acute encephalopathy: Secondary to problem #1. Resolved. No focal deficits.  Acute kidney injury: Related to sepsis and dehydration. Resolved.  Type II DM/IDDM: Brittle with some episodes of hypoglycemia this admission.  Continue Lantus and SSI and adjust as needed. Will adjust insulins for uncontrolled DM 2. Will decrease lantus from 30 to 26 units on 12/31.  History of severe protein calorie malnutrition: Continue nutritional supplements.  History of GERD: Continue PPI.  History of spinal cord infarction and quadriparesis: Paraplegic in lower extremities and weak in the upper extremities. Patient has had an indwelling Foley catheter since April 2015.  Dehydration with hyponatremia: resolved. DC IVF  Leukocytosis: Secondary to problem #1. Follow CBCs. Improving.  Thrombocytosis:  Likely reactive to wounds and wound treatment.  History of recently treated MSSA bacteremia: Continue above antibiotics and follow blood culture results.  Hypertension: Soft blood pressures. Hold antihypertensives.  Chronic benzodiazepine use: Continue same.  Chronic Foley catheter and traumatic hypospadias: Patient states that urology had consulted in the past and recommended suprapubic catheter but he was not medically stable to get it at that time. Discussed with urologist on call Dr. Era Bumpers on 12/24 would recommended looping the Foley catheter and taping it to abdomen to reduce gravity related further trauma to penis and decreasing the chance of urethral stricture. He also recommended Neosporin ointment or the junction of the Foley catheter in the penis and outpatient follow-up with urology to evaluate whether he would benefit from a suprapubic catheter. As per mother, follows with Dr. [facility]  Acute on chronic Anemia: Likely secondary to acute  illness and dilutional. No reported bleeding. Stable  Code Status:  Full Family Communication:  Discussed with  mother Disposition Plan:  Inpatient. D/c to home with full home health services when ready.  Consultants:  Infectious disease  General surgery  Procedures:  Changed Foley catheter  Debridement of decubitus ulcer 12/28  PICC line placed 12/28  Antibiotics:  IV vancomycin and 12/23 >  IV aztreonam 12/23 > 12/26  Objective: Filed Vitals:   10/06/14 0646 10/06/14 2027 10/07/14 0604 10/07/14 1310  BP:  99/64 95/70 97/64   Pulse:  89 87 84  Temp: 98.6 F (37 C) 98.9 F (37.2 C) 98.6 F (37 C) 99.1 F (37.3 C)  TempSrc: Oral Oral Oral Oral  Resp:  18 18 20   Height:      Weight:      SpO2:  100% 100% 100%    Intake/Output Summary (Last 24 hours) at 10/07/14 1558 Last data filed at 10/07/14 1105  Gross per 24 hour  Intake    630 ml  Output   1900 ml  Net  -1270 ml   Filed Weights   10/01/14 0408 10/02/14 0455 10/02/14 1841  Weight: 55.4 kg (122 lb 2.2 oz) 55.3 kg (121 lb 14.6 oz) 57 kg (125 lb 10.6 oz)   Exam: General exam:   thinly nourished chronically ill-looking young male,  Sleeping with headphones on.  Mother at bedside. Respiratory system: Clear. No increased work of breathing. Cardiovascular system: S1 & S2 heard, RRR. No JVD, murmurs, gallops, clicks or pedal edema. Gastrointestinal system: Abdomen is nondistended, soft and nontender. Normal bowel sounds heard. Extremities:  Grade 0 x 5 power in lower extremities and grade 3 x 5 power in upper extremities proximally and 0 x 5 power distally with contractures of fingers. Skin: Decubitus/surgical site dressing clean and dry.  Data Reviewed: Basic Metabolic Panel:  Recent Labs Lab 10/01/14 0345 10/02/14 0334 10/05/14 0500 10/06/14 0513 10/07/14 0545  NA 131* 133* 131* 132* 133*  K 4.2 4.0 4.3 5.0 5.3*  CL 106 105 101 102 102  CO2 20 22 26 26 25   GLUCOSE 221* 173* 270* 246* 195*  BUN 11  10 13 14 11   CREATININE 0.65 0.60 0.62 0.61 0.62  CALCIUM 9.1 9.4 9.1 8.8 9.5   Liver Function Tests:  Recent Labs Lab 10/05/14 0500  AST 12  ALT 8  ALKPHOS 87  BILITOT 0.2*  PROT 6.8  ALBUMIN 2.1*   CBC:  Recent Labs Lab 10/02/14 0334 10/03/14 0717 10/05/14 0500 10/06/14 0513 10/07/14 0545  WBC 15.2* 13.7* 12.5* 14.5* 12.0*  HGB 8.5* 9.4* 8.9* 8.4* 8.5*  HCT 26.5* 28.9* 28.0* 26.7* 27.0*  MCV 81.8 81.2 81.9 82.4 84.6  PLT 567* 597* 718* 690* 712*   BNP (last 3 results)  Recent Labs  01/29/14 2113  PROBNP 1343.0*   CBG:  Recent Labs Lab 10/06/14 1705 10/06/14 2133 10/06/14 2310 10/07/14 0751 10/07/14 1137  GLUCAP 86 50* 146* 154* 85   Surgical Pathology 10/04/2014 1.  Debridement, Sacral decubitus - BENIGN SKIN AND SUBCUTANEOUS SOFT TISSUE WITH FIBROSIS, NECROSIS, AND ACUTE INFLAMMATION. - NEGATIVE FOR MALIGNANCY. 2. Debridement, Right ischium - BENIGN SKIN AND SUBCUTANEOUS SOFT TISSUE WITH NECROSIS, FIBROSIS, ACUTE AND CHRONIC INFLAMMATION AND ABSCESS FORMATION. - NEGATIVE FOR MALIGNANCY.   Recent Results (from the past 240 hour(s))  Blood Culture (routine x 2)     Status: None   Collection Time: 09/29/14 12:16 PM  Result Value Ref Range Status   Specimen Description BLOOD LEFT HAND  Final   Special Requests BOTTLES DRAWN AEROBIC AND ANAEROBIC 5CC  Final  Culture  Setup Time   Final    09/29/2014 18:08 Performed at [place]   Final    NO GROWTH 5 DAYS Performed at [facility]    Report Status 10/05/2014 FINAL  Final  Blood Culture (routine x 2)     Status: None   Collection Time: 09/29/14  1:20 PM  Result Value Ref Range Status   Specimen Description BLOOD RIGHT ANTECUBITAL  Final   Special Requests BOTTLES DRAWN AEROBIC AND ANAEROBIC 5ML  Final   Culture  Setup Time   Final    09/29/2014 18:07 Performed at [place]   Final    NO GROWTH 5 DAYS Performed at [facility]    Report Status 10/05/2014 FINAL  Final  Urine culture     Status: None   Collection Time: 09/29/14  6:28 PM  Result Value Ref Range Status   Specimen Description URINE, CLEAN CATCH  Final   Special Requests NONE  Final   Culture  Setup Time   Final    09/30/2014 02:39 Performed at [place] Performed at [facility]   Final   Culture NO GROWTH Performed at [facility]   Final   Report Status 09/30/2014 FINAL  Final  MRSA PCR Screening     Status: None   Collection Time: 10/04/14  3:22 AM  Result Value Ref Range Status   MRSA by PCR NEGATIVE NEGATIVE Final    Comment:        The GeneXpert MRSA Assay (FDA approved for NASAL specimens only), is one component of a comprehensive MRSA colonization surveillance program. It is not intended to diagnose MRSA infection nor to guide or monitor treatment for MRSA infections.   Tissue culture     Status: None   Collection Time: 10/04/14  1:46 PM  Result Value Ref Range Status   Specimen Description TISSUE SACRAL  Final   Special Requests NONE  Final   Gram Stain   Final    NO WBC SEEN NO ORGANISMS SEEN Performed at [facility]    Culture   Final    MULTIPLE ORGANISMS PRESENT, NONE PREDOMINANT Note: NO STAPHYLOCOCCUS AUREUS ISOLATED NO GROUP A STREP (S.PYOGENES) ISOLATED Performed at [facility]    Report Status 10/07/2014 FINAL  Final  Anaerobic culture     Status: None (Preliminary result)   Collection Time: 10/04/14  1:46 PM  Result Value Ref Range Status   Specimen Description TISSUE SACRAL  Final   Special Requests NONE  Final   Gram Stain   Final    NO WBC SEEN NO ORGANISMS SEEN Performed at [facility]    Culture   Final    NO ANAEROBES ISOLATED; CULTURE IN PROGRESS FOR 5 DAYS Performed at [facility]    Report Status PENDING  Incomplete  Clostridium Difficile by PCR     Status: None   Collection Time:  10/06/14  2:58 PM  Result Value Ref Range Status   C difficile by pcr NEGATIVE NEGATIVE Final      Studies: No results found.   Scheduled Meds: . antiseptic oral rinse  7 mL Mouth Rinse BID  . atorvastatin  20 mg Oral Daily  . bacitracin   Topical BID  . collagenase   Topical Daily  . feeding supplement (GLUCERNA SHAKE)  237 mL Oral TID BM  .  heparin  5,000 Units Subcutaneous 3 times per day  . insulin aspart  0-15 Units Subcutaneous TID WC  . insulin aspart  0-5 Units Subcutaneous QHS  . insulin aspart  6 Units Subcutaneous TID WC  . insulin glargine  26 Units Subcutaneous QHS  . loratadine  10 mg Oral Daily  . metoCLOPramide  5 mg Oral Once per day on Mon Thu  . mupirocin cream   Topical BID  . pantoprazole  40 mg Oral Daily  . polyethylene glycol  17 g Oral Daily  . pregabalin  100 mg Oral QID  . sodium chloride  3 mL Intravenous Q12H  . sodium polystyrene  30 g Oral Once  . vancomycin  750 mg Intravenous Q12H   Continuous Infusions:    Principal Problem:   Osteomyelitis, pelvis Active Problems:   GERD   Protein-calorie malnutrition, severe   IDDM (insulin dependent diabetes mellitus)   Spinal cord infarction (history of)   Sepsis   UTI (lower urinary tract infection)   Cellulitis and abscess of leg, except foot   Abscess   Staphylococcus aureus bacteremia with sepsis   AKI (acute kidney injury)  Karen Kitchens [facility] Pager [phone]  If 7PM-7AM, please contact night-coverage [email or web address] Password [email or web address] 10/07/2014, 3:58 PM    LOS: 8 days    Patient was seen, examined,treatment plan was discussed with the Advance Practice Provider.  I have directly reviewed the clinical findings, lab, imaging studies and management of this patient in detail. I have made the necessary changes to the above noted documentation, and agree with the documentation, as recorded by the Advance Practice Provider.   Marzetta Board, MD [facility] [phone]

## 2014-10-07 NOTE — Progress Notes (Signed)
ANTIBIOTIC CONSULT NOTE - FOLLOW UP  Pharmacy Consult for Vancomycin Indication: wound infection  Allergies  Allergen Reactions  . Cefuroxime Axetil Anaphylaxis  . Penicillins Anaphylaxis and Other (See Comments)    ?can take amoxicillin?  [name] [Benzonatate] Anaphylaxis  . Shellfish Allergy Itching and Other (See Comments)    Took benadryl to alleviate reaction    Patient Measurements: Height: 5\' 8"  (172.7 cm) Weight: 125 lb 10.6 oz (57 kg) IBW/kg (Calculated) : 68.4 Adjusted Body Weight:   Vital Signs: Temp: 98.6 F (37 C) (12/31 0604) Temp Source: Oral (12/31 0604) BP: 95/70 mmHg (12/31 0604) Pulse Rate: 87 (12/31 0604) Intake/Output from previous day: 12/30 0701 - 12/31 0700 In: 630 [P.O.:480; IV Piggyback:150] Out: 1350 [Urine:1350] Intake/Output from this shift: Total I/O In: -  Out: 550 [Urine:550]  Labs:  Recent Labs  10/05/14 0500 10/06/14 0513 10/07/14 0545  WBC 12.5* 14.5* 12.0*  HGB 8.9* 8.4* 8.5*  PLT 718* 690* 712*  CREATININE 0.62 0.61 0.62   Estimated Creatinine Clearance: 108.9 mL/min (by C-G formula based on Cr of 0.62).  Recent Labs  10/07/14 0840  VANCOTROUGH 24.0*     Microbiology: Recent Results (from the past 720 hour(s))  Blood Culture (routine x 2)     Status: None   Collection Time: 09/16/14  3:25 AM  Result Value Ref Range Status   Specimen Description BLOOD RIGHT ARM  Final   Special Requests BOTTLES DRAWN AEROBIC AND ANAEROBIC 10CC  Final   Culture  Setup Time   Final    09/16/2014 08:58 Performed at [place]   Final    NO GROWTH 5 DAYS Performed at [facility]    Report Status 09/22/2014 FINAL  Final  Blood Culture (routine x 2)     Status: None   Collection Time: 09/16/14  3:30 AM  Result Value Ref Range Status   Specimen Description BLOOD LEFT ARM  Final   Special Requests BOTTLES DRAWN AEROBIC ONLY 10CC  Final   Culture  Setup Time   Final    09/16/2014 08:58 Performed  at [place]   Final    NO GROWTH 5 DAYS Performed at [facility]    Report Status 09/22/2014 FINAL  Final  Urine culture     Status: None   Collection Time: 09/16/14  4:09 AM  Result Value Ref Range Status   Specimen Description URINE, RANDOM  Final   Special Requests NONE  Final   Culture  Setup Time   Final    09/16/2014 08:45 Performed at [place]   Final    >=100,000 COLONIES/ML Performed at [place]   Final    KLEBSIELLA SPECIES Performed at [facility]    Report Status 09/18/2014 FINAL  Final   Organism ID, Bacteria KLEBSIELLA SPECIES  Final      Susceptibility   Klebsiella species - MIC*    AMPICILLIN >=32 RESISTANT Resistant     CEFAZOLIN <=4 SENSITIVE Sensitive     CEFTRIAXONE <=1 SENSITIVE Sensitive     CIPROFLOXACIN <=0.25 SENSITIVE Sensitive     GENTAMICIN <=1 SENSITIVE Sensitive     NITROFURANTOIN <=16 SENSITIVE Sensitive     TOBRAMYCIN <=1 SENSITIVE Sensitive     TRIMETH/SULFA <=20 SENSITIVE Sensitive     PIP/TAZO 8 SENSITIVE Sensitive     * KLEBSIELLA SPECIES  MRSA PCR Screening  Status: None   Collection Time: 09/16/14  7:08 AM  Result Value Ref Range Status   MRSA by PCR NEGATIVE NEGATIVE Final    Comment:        The GeneXpert MRSA Assay (FDA approved for NASAL specimens only), is one component of a comprehensive MRSA colonization surveillance program. It is not intended to diagnose MRSA infection nor to guide or monitor treatment for MRSA infections.   Culture, respiratory (tracheal aspirate)     Status: None   Collection Time: 09/17/14  6:19 AM  Result Value Ref Range Status   Specimen Description TRACHEAL ASPIRATE  Final   Special Requests NONE  Final   Gram Stain   Final    FEW WBC PRESENT,BOTH PMN AND MONONUCLEAR RARE SQUAMOUS EPITHELIAL CELLS PRESENT NO ORGANISMS SEEN Performed at [facility]    Culture   Final    FEW  STAPHYLOCOCCUS AUREUS Note: RIFAMPIN AND GENTAMICIN SHOULD NOT BE USED AS SINGLE DRUGS FOR TREATMENT OF STAPH INFECTIONS. Performed at [facility]    Report Status 09/20/2014 FINAL  Final   Organism ID, Bacteria STAPHYLOCOCCUS AUREUS  Final      Susceptibility   Staphylococcus aureus - MIC*    CLINDAMYCIN >=8 RESISTANT Resistant     ERYTHROMYCIN >=8 RESISTANT Resistant     GENTAMICIN <=0.5 SENSITIVE Sensitive     LEVOFLOXACIN 0.25 SENSITIVE Sensitive     OXACILLIN 1 SENSITIVE Sensitive     PENICILLIN >=0.5 RESISTANT Resistant     RIFAMPIN <=0.5 SENSITIVE Sensitive     TRIMETH/SULFA <=10 SENSITIVE Sensitive     VANCOMYCIN 1 SENSITIVE Sensitive     TETRACYCLINE >=16 RESISTANT Resistant     MOXIFLOXACIN <=0.25 SENSITIVE Sensitive     * FEW STAPHYLOCOCCUS AUREUS  Clostridium Difficile by PCR     Status: None   Collection Time: 09/21/14 10:10 PM  Result Value Ref Range Status   C difficile by pcr NEGATIVE NEGATIVE Final  Blood Culture (routine x 2)     Status: None   Collection Time: 09/29/14 12:16 PM  Result Value Ref Range Status   Specimen Description BLOOD LEFT HAND  Final   Special Requests BOTTLES DRAWN AEROBIC AND ANAEROBIC 5CC  Final   Culture  Setup Time   Final    09/29/2014 18:08 Performed at [place]   Final    NO GROWTH 5 DAYS Performed at [facility]    Report Status 10/05/2014 FINAL  Final  Blood Culture (routine x 2)     Status: None   Collection Time: 09/29/14  1:20 PM  Result Value Ref Range Status   Specimen Description BLOOD RIGHT ANTECUBITAL  Final   Special Requests BOTTLES DRAWN AEROBIC AND ANAEROBIC 5ML  Final   Culture  Setup Time   Final    09/29/2014 18:07 Performed at [place]   Final    NO GROWTH 5 DAYS Performed at [facility]    Report Status 10/05/2014 FINAL  Final  Urine culture     Status: None   Collection Time: 09/29/14  6:28 PM  Result Value Ref Range Status    Specimen Description URINE, CLEAN CATCH  Final   Special Requests NONE  Final   Culture  Setup Time   Final    09/30/2014 02:39 Performed at [place] Performed at [facility]   Final   Culture NO  GROWTH Performed at [facility]   Final   Report Status 09/30/2014 FINAL  Final  MRSA PCR Screening     Status: None   Collection Time: 10/04/14  3:22 AM  Result Value Ref Range Status   MRSA by PCR NEGATIVE NEGATIVE Final    Comment:        The GeneXpert MRSA Assay (FDA approved for NASAL specimens only), is one component of a comprehensive MRSA colonization surveillance program. It is not intended to diagnose MRSA infection nor to guide or monitor treatment for MRSA infections.   Tissue culture     Status: None   Collection Time: 10/04/14  1:46 PM  Result Value Ref Range Status   Specimen Description TISSUE SACRAL  Final   Special Requests NONE  Final   Gram Stain   Final    NO WBC SEEN NO ORGANISMS SEEN Performed at [facility]    Culture   Final    MULTIPLE ORGANISMS PRESENT, NONE PREDOMINANT Note: NO STAPHYLOCOCCUS AUREUS ISOLATED NO GROUP A STREP (S.PYOGENES) ISOLATED Performed at [facility]    Report Status 10/07/2014 FINAL  Final  Anaerobic culture     Status: None (Preliminary result)   Collection Time: 10/04/14  1:46 PM  Result Value Ref Range Status   Specimen Description TISSUE SACRAL  Final   Special Requests NONE  Final   Gram Stain   Final    NO WBC SEEN NO ORGANISMS SEEN Performed at [facility]    Culture   Final    NO ANAEROBES ISOLATED; CULTURE IN PROGRESS FOR 5 DAYS Performed at [facility]    Report Status PENDING  Incomplete  Clostridium Difficile by PCR     Status: None   Collection Time: 10/06/14  2:58 PM  Result Value Ref Range Status   C difficile by pcr NEGATIVE NEGATIVE Final    Anti-infectives    Start     Dose/Rate Route Frequency  Ordered Stop   09/30/14 1400  vancomycin (VANCOCIN) IVPB 1000 mg/200 mL premix  Status:  Discontinued     1,000 mg200 mL/hr over 60 Minutes Intravenous Every 24 hours 09/29/14 1326 09/29/14 1848   09/30/14 1400  vancomycin (VANCOCIN) IVPB 1000 mg/200 mL premix  Status:  Discontinued     1,000 mg200 mL/hr over 60 Minutes Intravenous Every 24 hours 09/29/14 1916 09/30/14 0845   09/30/14 1000  vancomycin (VANCOCIN) IVPB 750 mg/150 ml premix     750 mg150 mL/hr over 60 Minutes Intravenous Every 12 hours 09/30/14 0845     09/29/14 2200  aztreonam (AZACTAM) 1 g in dextrose 5 % 50 mL IVPB  Status:  Discontinued     1 g100 mL/hr over 30 Minutes Intravenous 3 times per day 09/29/14 1326 09/29/14 1848   09/29/14 2200  aztreonam (AZACTAM) 1 g in dextrose 5 % 50 mL IVPB  Status:  Discontinued     1 g100 mL/hr over 30 Minutes Intravenous 3 times per day 09/29/14 1916 10/01/14 1426   09/29/14 1300  aztreonam (AZACTAM) 2 g in dextrose 5 % 50 mL IVPB     2 g100 mL/hr over 30 Minutes Intravenous STAT 09/29/14 1239 09/29/14 1355   09/29/14 1245  vancomycin (VANCOCIN) IVPB 1000 mg/200 mL premix     1,000 mg200 mL/hr over 60 Minutes Intravenous  Once 09/29/14 1239 09/29/14 1530      Assessment: CC: 28 yo M admitted on 09/29/2014 with weakness, SOB. Admitted in November for MSSA  bacteremia with MRI c/w OM under eschar on right side. Only received 4 weeks of planned 6-8 weeks IV abx. Now readmitted with likely sepsis 2/2 ischial abscess.  PMH: GERD, asthma, diabetic neuropathy, seizures, CVA, DM, fibromyalgia, paraplegia d/t CVA  AC: Heparin SQ for VTE proph. CBC 9.4>>8.9>>8.5. Watch.  ID: Vanc D#9 for sepsis/ischial abscess of decub ulcer - Tmax 100. LA 1.11, WBC 12, Recently completed Vancomycin (09/17/14 VR= 15) and Cipro, PTA fluconazole. Osteo in R pelvic ring. ID feels strongly that sepsis is likely 2/2 ischial abscess and would like to cont tx with vanc but d/c aztreonam. 12/28: deep tissues debridement fo  ischial and sacrum. Renal stable. Stools are contaminating wounds.  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 Chronic foley (changed this admit)  Pulm: Hx asthma. RA. Claritin  Heme/Onc: Hgb 8.5 low, plt elevated; no reported s/s bleeding  Best practices: heparin SQ, PPI  Goal of Therapy:  Vancomycin trough level 15-20 mcg/ml  Plan:  Decrease Vancomycin 500mg  IV q12h Recommend Vanco trough once weekly.   Murat Rideout S. Alford [place], PharmD, BCPS Clinical Staff Pharmacist Pager [phone]  [place], [place] 10/07/2014,12:45 PM

## 2014-10-07 NOTE — Consult Note (Addendum)
WOC noted that NPWT VAC dressing was removed yesterday due to several large, loose BMs which affected seal on the VAC dressing. Due to proximity of these wounds to the rectum will not restart the VAC at this time. Will order NS packing until wounds are smaller and could be more appropriately managed with a NPWT and/or patient has diverting ostomy.  Will ask PT to continue hydrotherapy while inpatient M/W/F.   Will update mother and patient on wound care. OK for [facility] to continue NS dressings for these areas, foam for the left ischial wound, and Bactroban for the left toes and foot wound. Would definitely recommend low air loss mattress in the home if not already in place and no more than one hour up in [facility] during the day to limit additional pressure on the right ischial wound.   [place] team will follow along with you for weekly wound assessments.  Please notify me of any acute changes in the wounds or any new areas of concerns Para March RN,CWOCN [phone]

## 2014-10-08 DIAGNOSIS — E875 Hyperkalemia: Secondary | ICD-10-CM

## 2014-10-08 LAB — BASIC METABOLIC PANEL
Anion gap: 5 (ref 5–15)
BUN: 8 mg/dL (ref 6–23)
CALCIUM: 9.1 mg/dL (ref 8.4–10.5)
CHLORIDE: 100 meq/L (ref 96–112)
CO2: 27 mmol/L (ref 19–32)
Creatinine, Ser: 0.6 mg/dL (ref 0.50–1.35)
GFR calc Af Amer: >90 mL/min (ref >90)
GLUCOSE: 235 mg/dL — AB (ref 70–99)
Potassium: 4.3 mmol/L (ref 3.5–5.1)
Sodium: 132 mmol/L — ABNORMAL LOW (ref 135–145)

## 2014-10-08 LAB — GLUCOSE, CAPILLARY
GLUCOSE-CAPILLARY: 162 mg/dL — AB (ref 70–99)
GLUCOSE-CAPILLARY: 104 mg/dL — AB (ref 70–99)
Glucose-Capillary: 133 mg/dL — ABNORMAL HIGH (ref 70–99)
Glucose-Capillary: 156 mg/dL — ABNORMAL HIGH (ref 70–99)

## 2014-10-08 LAB — CBC
HEMATOCRIT: 25.2 % — AB (ref 39.0–52.0)
HEMOGLOBIN: 8 g/dL — AB (ref 13.0–17.0)
MCH: 27.1 pg (ref 26.0–34.0)
MCHC: 31.7 g/dL (ref 30.0–36.0)
MCV: 85.4 fL (ref 78.0–100.0)
Platelets: 716 10*3/uL — ABNORMAL HIGH (ref 150–400)
RBC: 2.95 MIL/uL — AB (ref 4.22–5.81)
RDW: 16.3 % — ABNORMAL HIGH (ref 11.5–15.5)
WBC: 11.6 10*3/uL — ABNORMAL HIGH (ref 4.0–10.5)

## 2014-10-08 NOTE — Progress Notes (Signed)
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  Now 2 weeks off vancomycin, chronic indwelling Foley catheter, not on home oxygen, presented to ED with altered mental status/somnolence, leukocytosis, fever and chills. Infectious disease consulting. Patient sepsis attributed to right ischial tuberosity decubitus ulcer. Surgery consulted and patient underwent debridement and bone biopsy for osteomyelitis on 12/28. Bone biopsy results pending. PICC line placed. ID recommending 6-8 weeks of IV vancomycin.   Subjective: - no complaints this morning, eating  Assessment/Plan:  Sepsis/fever:  Likely from infected right ischial tuberosity decubitus ulcer with underlying osteomyelitis. Foley catheter changed this admission. Blood cultures 2 negative to date. Persistent left lower lobe consolidation on chest x-ray but no respiratory symptoms. Recently treated for MSSA bacteremia. Recent TEE was negative. Wound care consultation appreciated. Continue IV vancomycin. Aztreonam discontinued by ID on 12/26. No fevers, sepsis physiology resolved  Sacral decubitus ulcer/pelvic OM:  MRI shows osteomyelitis of right inferior pubic ramus, but no osteo in smaller left distal tuberosity.  Status post right decubitus ulcer debridement 12/28. Patient has multiple sacral/buttock stage III ulcers, right ischial stage IV ulcer, left foot with several full-thickness wounds, fifth toe and fourth toe wound. Management per wound care team. WOC has recommended hydrotherapy and wound vacs as appropriate.  Surgery planning diverting ostomy for next week.  Pathology results are below.  Mild hyperkalemia  Potassium trending up despite good kidney function, unclear etiology,  CBGs with good control, renal function intact, ? Multiple po supplements and increased intake - s/p Kayexelate 12/31, K better today   Acute encephalopathy: Secondary to problem #1. Resolved. No focal deficits.  Acute kidney injury: Related to sepsis and dehydration. Resolved.  Type II DM/IDDM: Brittle with some episodes of hypoglycemia this admission.  Continue Lantus and SSI and adjust as needed. Will adjust insulins for uncontrolled DM 2. Will decrease lantus from 30 to 26 units on 12/31.  History of severe protein calorie malnutrition: Continue nutritional supplements.  History of GERD: Continue PPI.  History of spinal cord infarction and quadriparesis: Paraplegic in lower extremities and weak in the upper extremities. Patient has had an indwelling Foley catheter since April 2015.  Dehydration with hyponatremia: resolved. DC IVF  Leukocytosis: Secondary to problem #1. Follow CBCs. Improving.  Thrombocytosis:  Likely reactive to wounds and wound treatment.  History of recently treated MSSA bacteremia: Continue above antibiotics and follow blood culture results.  Hypertension: Soft blood pressures. Hold antihypertensives.  Chronic benzodiazepine use: Continue same.  Chronic Foley catheter and traumatic hypospadias: Patient states that urology had consulted in the past and recommended suprapubic catheter but he was not medically stable to get it at that time. Discussed with urologist on call Dr. Era Bumpers on 12/24 would recommended looping the Foley catheter and taping it to abdomen to reduce gravity related further trauma to penis and decreasing the chance of urethral stricture. He also recommended Neosporin ointment or the junction of the Foley catheter in the penis and outpatient follow-up with urology to evaluate whether he would benefit from a suprapubic catheter. As per mother, follows with Dr. [facility]  Acute on chronic Anemia: Likely secondary to acute illness and  dilutional. No reported bleeding. Stable  Code Status:  Full Family Communication:  Discussed with mother Disposition Plan:  Inpatient. D/c to home with full home  health services when ready.  Consultants:  Infectious disease  General surgery  Procedures:  Changed Foley catheter  Debridement of decubitus ulcer 12/28  PICC line placed 12/28  Antibiotics:  IV vancomycin and 12/23 >  IV aztreonam 12/23 > 12/26  Objective: Filed Vitals:   10/07/14 1310 10/07/14 2224 10/08/14 0500 10/08/14 0550  BP: 97/64 109/68  97/58  Pulse: 84 90  92  Temp: 99.1 F (37.3 C) 99.3 F (37.4 C) 99.4 F (37.4 C) 99.1 F (37.3 C)  TempSrc: Oral Oral  Oral  Resp: 20 18  18   Height:      Weight:      SpO2: 100% 100%  100%    Intake/Output Summary (Last 24 hours) at 10/08/14 1036 Last data filed at 10/08/14 0200  Gross per 24 hour  Intake    240 ml  Output   1750 ml  Net  -1510 ml   Filed Weights   10/01/14 0408 10/02/14 0455 10/02/14 1841  Weight: 55.4 kg (122 lb 2.2 oz) 55.3 kg (121 lb 14.6 oz) 57 kg (125 lb 10.6 oz)   Exam: General exam:   thinly nourished chronically ill-looking young male,  Sleeping with headphones on.  Mother at bedside. Respiratory system: Clear. No increased work of breathing. Cardiovascular system: S1 & S2 heard, RRR. No JVD, murmurs, gallops, clicks or pedal edema. Gastrointestinal system: Abdomen is nondistended, soft and nontender. Normal bowel sounds heard. Extremities:  Grade 0 x 5 power in lower extremities and grade 3 x 5 power in upper extremities proximally and 0 x 5 power distally with contractures of fingers. Skin: Decubitus/surgical site dressing clean and dry.  Data Reviewed: Basic Metabolic Panel:  Recent Labs Lab 10/02/14 0334 10/05/14 0500 10/06/14 0513 10/07/14 0545 10/08/14 0515  NA 133* 131* 132* 133* 132*  K 4.0 4.3 5.0 5.3* 4.3  CL 105 101 102 102 100  CO2 22 26 26 25 27   GLUCOSE 173* 270* 246* 195* 235*  BUN 10 13 14 11 8    CREATININE 0.60 0.62 0.61 0.62 0.60  CALCIUM 9.4 9.1 8.8 9.5 9.1   Liver Function Tests:  Recent Labs Lab 10/05/14 0500  AST 12  ALT 8  ALKPHOS 87  BILITOT 0.2*  PROT 6.8  ALBUMIN 2.1*   CBC:  Recent Labs Lab 10/03/14 0717 10/05/14 0500 10/06/14 0513 10/07/14 0545 10/08/14 0515  WBC 13.7* 12.5* 14.5* 12.0* 11.6*  HGB 9.4* 8.9* 8.4* 8.5* 8.0*  HCT 28.9* 28.0* 26.7* 27.0* 25.2*  MCV 81.2 81.9 82.4 84.6 85.4  PLT 597* 718* 690* 712* 716*   BNP (last 3 results)  Recent Labs  01/29/14 2113  PROBNP 1343.0*   CBG:  Recent Labs Lab 10/07/14 0751 10/07/14 1137 10/07/14 1656 10/07/14 2224 10/08/14 0756  GLUCAP 154* 85 105* 254* 133*   Surgical Pathology 10/04/2014 1.  Debridement, Sacral decubitus - BENIGN SKIN AND SUBCUTANEOUS SOFT TISSUE WITH FIBROSIS, NECROSIS, AND ACUTE INFLAMMATION. - NEGATIVE FOR MALIGNANCY. 2. Debridement, Right ischium - BENIGN SKIN AND SUBCUTANEOUS SOFT TISSUE WITH NECROSIS, FIBROSIS, ACUTE AND CHRONIC INFLAMMATION AND ABSCESS FORMATION. - NEGATIVE FOR MALIGNANCY.   Recent Results (from the past 240 hour(s))  Blood Culture (routine x 2)     Status: None   Collection Time: 09/29/14 12:16 PM  Result Value Ref Range Status   Specimen Description BLOOD LEFT HAND  Final   Special Requests BOTTLES DRAWN AEROBIC AND ANAEROBIC 5CC  Final   Culture  Setup Time   Final    09/29/2014  18:08 Performed at [facility]   Final    NO GROWTH 5 DAYS Performed at [facility]    Report Status 10/05/2014 FINAL  Final  Blood Culture (routine x 2)     Status: None   Collection Time: 09/29/14  1:20 PM  Result Value Ref Range Status   Specimen Description BLOOD RIGHT ANTECUBITAL  Final   Special Requests BOTTLES DRAWN AEROBIC AND ANAEROBIC 5ML  Final   Culture  Setup Time   Final    09/29/2014 18:07 Performed at [facility]   Final    NO GROWTH 5 DAYS Performed at [facility]     Report Status 10/05/2014 FINAL  Final  Urine culture     Status: None   Collection Time: 09/29/14  6:28 PM  Result Value Ref Range Status   Specimen Description URINE, CLEAN CATCH  Final   Special Requests NONE  Final   Culture  Setup Time   Final    09/30/2014 02:39 Performed at [facility] Performed at [facility]   Final   Culture NO GROWTH Performed at [facility]   Final   Report Status 09/30/2014 FINAL  Final  MRSA PCR Screening     Status: None   Collection Time: 10/04/14  3:22 AM  Result Value Ref Range Status   MRSA by PCR NEGATIVE NEGATIVE Final    Comment:        The GeneXpert MRSA Assay (FDA approved for NASAL specimens only), is one component of a comprehensive MRSA colonization surveillance program. It is not intended to diagnose MRSA infection nor to guide or monitor treatment for MRSA infections.   Tissue culture     Status: None   Collection Time: 10/04/14  1:46 PM  Result Value Ref Range Status   Specimen Description TISSUE SACRAL  Final   Special Requests NONE  Final   Gram Stain   Final    NO WBC SEEN NO ORGANISMS SEEN Performed at [facility]    Culture   Final    MULTIPLE ORGANISMS PRESENT, NONE PREDOMINANT Note: NO STAPHYLOCOCCUS AUREUS ISOLATED NO GROUP A STREP (S.PYOGENES) ISOLATED Performed at [facility]    Report Status 10/07/2014 FINAL  Final  Anaerobic culture     Status: None (Preliminary result)   Collection Time: 10/04/14  1:46 PM  Result Value Ref Range Status   Specimen Description TISSUE SACRAL  Final   Special Requests NONE  Final   Gram Stain   Final    NO WBC SEEN NO ORGANISMS SEEN Performed at [facility]    Culture   Final    NO ANAEROBES ISOLATED; CULTURE IN PROGRESS FOR 5 DAYS Performed at [facility]    Report Status PENDING  Incomplete  Clostridium Difficile by PCR     Status: None   Collection Time: 10/06/14  2:58 PM    Result Value Ref Range Status   C difficile by pcr NEGATIVE NEGATIVE Final      Studies: No results found.   Scheduled Meds: . antiseptic oral rinse  7 mL Mouth Rinse BID  . atorvastatin  20 mg Oral Daily  . bacitracin   Topical BID  . collagenase   Topical Daily  . feeding supplement (GLUCERNA SHAKE)  237 mL Oral TID BM  . heparin  5,000 Units Subcutaneous 3 times per day  .  insulin aspart  0-15 Units Subcutaneous TID WC  . insulin aspart  0-5 Units Subcutaneous QHS  . insulin aspart  6 Units Subcutaneous TID WC  . insulin glargine  26 Units Subcutaneous QHS  . loratadine  10 mg Oral Daily  . metoCLOPramide  5 mg Oral Once per day on Mon Thu  . mupirocin cream   Topical BID  . pantoprazole  40 mg Oral Daily  . polyethylene glycol  17 g Oral Daily  . pregabalin  100 mg Oral QID  . sodium chloride  3 mL Intravenous Q12H  . vancomycin  500 mg Intravenous Q12H   Continuous Infusions:    Principal Problem:   Osteomyelitis, pelvis Active Problems:   GERD   Protein-calorie malnutrition, severe   IDDM (insulin dependent diabetes mellitus)   Spinal cord infarction (history of)   Sepsis   UTI (lower urinary tract infection)   Cellulitis and abscess of leg, except foot   Abscess   Staphylococcus aureus bacteremia with sepsis   AKI (acute kidney injury)  Liset Mcmonigle M. Cruzita Lederer, MD [facility] [phone]  If 7PM-7AM, please contact night-coverage [email or web address] Password [email or web address] 10/08/2014, 10:36 AM    LOS: 9 days

## 2014-10-08 NOTE — Clinical Social Work Note (Signed)
Met with patient and mother at bedside to check in. Patient and mother would like to meet early next to discuss disposition options.   Bryant  MSW, LCSWA, LCASA, [phone] 

## 2014-10-08 NOTE — Progress Notes (Signed)
Inpatient Diabetes Program Recommendations  AACE/ADA: New Consensus Statement on Inpatient Glycemic Control (2013)  Target Ranges:  Prepandial:   less than 140 mg/dL      Peak postprandial:   less than 180 mg/dL (1-2 hours)      Critically ill patients:  140 - 180 mg/dL   Reason for Visit: Labile Blood Sugars  Results for PAO, HAFFEY (MRN [id number]) as of 10/08/2014 13:51  Ref. Range 10/05/2014 05:00 10/06/2014 05:13 10/07/2014 05:45 10/08/2014 05:15  Glucose Latest Range: 70-99 mg/dL 270 (H) 246 (H) 195 (H) 235 (H)   FBS >200 mg/dL with Lantus 26 units QHS.  Consider increasing Lantus to 28 units QHS. Post-prandial blood sugars trending well today.  Will continue to follow. Thank you. Lorenda Peck, RD, LDN, CDE Inpatient Diabetes Coordinator [phone]

## 2014-10-08 NOTE — Progress Notes (Signed)
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 4cm x 6 cm  necrotic (Active)  Dressing Type Moist to dry 10/01/2014  8:00 AM  Dressing Clean;Dry;Intact 10/01/2014  8:00 AM  Dressing Change Frequency Daily 10/01/2014  8:00 AM  State of Healing Eschar 10/01/2014 11:00 AM  Site / Wound Assessment Black;Pink 10/01/2014 11:00 AM  % Wound base Red or Granulating 95% 10/01/2014 11:00 AM  % Wound base Yellow 0% 10/01/2014 11:00 AM  % Wound base Black 5% 10/01/2014 11:00 AM  Peri-wound Assessment Intact;Pink 10/01/2014 11:00 AM  Margins Unattached edges (unapproximated) 10/01/2014 11:00 AM  Drainage Amount Scant 10/01/2014 11:00 AM  Drainage Description Purulent 09/23/2014 11:00 AM  Treatment Debridement (Selective) 10/01/2014 11:00 AM     Pressure Ulcer 08/13/14 Stage III -  Full thickness tissue loss. Subcutaneous fat may be visible but bone, tendon or muscle are NOT exposed. sacrum and bilat buttocks (Active)  Dressing Type Foam;Gauze (Comment) 10/01/2014 11:00 AM  Dressing Changed 10/01/2014 11:00 AM  Dressing Change Frequency Daily 10/01/2014 11:00 AM  State of Healing Non-healing 09/22/2014  8:00 AM  Site / Wound Assessment Red;Yellow 10/01/2014 11:00 AM  % Wound base Red or Granulating 85% 10/01/2014 11:00 AM  % Wound base Yellow 15% 10/01/2014 11:00 AM  Peri-wound Assessment Intact;Pink 10/01/2014 11:00 AM  Margins Unattached edges (unapproximated) 10/01/2014 11:00 AM  Drainage Amount None 10/01/2014 11:00 AM  Drainage Description Serous 09/23/2014 11:00 AM  Treatment Cleansed 10/01/2014 11:00 AM     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 (Active)  Dressing Type ABD;Moist to dry 10/08/2014  9:47 AM  Dressing Clean;Dry;Intact 10/08/2014  9:47 AM  Dressing Change Frequency Twice a day 10/08/2014  9:47 AM  Site / Wound Assessment Bleeding;Yellow;Pink 10/06/2014  2:28 AM  % Wound base Red or Granulating 40% 10/06/2014  2:28 AM  % Wound base Yellow 50% 10/06/2014  2:28 AM  % Wound base Black 10% 10/06/2014  2:28 AM  Peri-wound Assessment Bleeding 10/06/2014  2:28 AM  Wound Length (cm) 5.5 cm 10/06/2014  2:28 AM  Wound Width (cm) 7 cm 10/06/2014  2:28 AM  Wound Depth (cm) 1.5 cm 10/06/2014  2:28 AM  Margins Unattached edges (unapproximated) 10/06/2014  2:28 AM  Drainage Amount Scant 10/06/2014  2:28 AM  Treatment Other (Comment) 10/07/2014  8:28 PM     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 (Active)  Dressing Type Gauze (Comment);Moist to dry 10/08/2014 12:00 PM  Dressing Clean;Dry;Intact;Changed 10/08/2014 12:00 PM  Dressing Change Frequency Monday, Wednesday, Friday 10/08/2014 12:00 PM  Site / Wound Assessment Red;Pink;Yellow;Bleeding 10/08/2014 12:00 PM  % Wound base Red or Granulating 70% 10/08/2014 12:00 PM  % Wound base Yellow 5% 10/08/2014 12:00 PM  % Wound base Other (Comment) 25% 10/08/2014 12:00 PM  Peri-wound Assessment Erythema (blanchable) 10/08/2014 12:00 PM  Wound Length (cm) 10 cm 10/06/2014 12:36 PM  Wound Width (cm) 7 cm 10/06/2014 12:36 PM  Wound Depth (cm) 2 cm 10/06/2014 12:36 PM  Tunneling (cm) 3.6 10/06/2014  2:28 AM  Margins Unattached edges (unapproximated) 10/08/2014 12:00 PM  Drainage Amount Minimal 10/08/2014 12:00 PM  Drainage Description Serous 10/08/2014 12:00 PM  Treatment Cleansed;Debridement (Selective);Hydrotherapy (Pulse lavage) 10/08/2014 12:00 PM     Wound / Incision (Open or Dehisced) 08/20/14 Other (Comment) Finger (Comment which one) Left middle, eschar  looking wound (Active)  Dressing Type None 10/08/2014  9:47 AM  Dressing Status None 09/22/2014  3:30 AM  Site / Wound Assessment Dry;Red 09/24/2014  8:35 AM  Peri-wound Assessment Intact 09/19/2014  9:51 PM  Margins Unattached edges (unapproximated) 09/21/2014  8:00 AM  Closure None 09/21/2014  8:00 AM  Drainage Amount None 09/21/2014  8:00 AM  Treatment Cleansed 09/16/2014  8:00 PM     Wound / Incision (Open or Dehisced) 09/16/14 Other (Comment) Toe (Comment  which one) Left (Active)  Dressing Type Other (Comment) 10/08/2014  9:47 AM  Dressing Changed Other (Comment) 10/07/2014 10:27 PM  Dressing Status New drainage 10/08/2014  9:47 AM  Dressing Change Frequency Daily 10/01/2014  8:00 AM  Site / Wound Assessment Red;Other (Comment) 10/08/2014  9:47 AM  % Wound base Red or Granulating 95% 10/01/2014  8:00 AM  Peri-wound Assessment Pink 09/23/2014 11:00 AM  Margins Unattached edges (unapproximated) 10/01/2014  8:00 AM  Drainage Amount Scant 10/01/2014  8:00 AM  Drainage Description Serosanguineous 10/01/2014  8:00 AM  Non-staged Wound Description Partial thickness 10/01/2014  8:00 AM  Treatment Cleansed 09/30/2014  8:16 PM     Wound / Incision (Open or Dehisced) 09/30/14 Other (Comment) Sacrum Medial Reddened , Excoriated (Active)  Dressing Type Foam 10/04/2014  8:40 AM  Site / Wound Assessment Dressing in place / Unable to assess 10/06/2014  7:55 AM     Incision (Closed) 10/04/14 Buttocks Other (Comment) (Active)  Dressing Type Gauze (Comment) 10/06/2014  7:55 AM  Dressing Intact;Clean;Dry 10/06/2014  7:55 AM  Site / Wound Assessment Dressing in place / Unable to assess 10/06/2014  7:55 AM  Drainage Amount None 10/04/2014  3:15 PM   Hydrotherapy Pulsed lavage therapy - wound location: R Ischium Pulsed Lavage with Suction (psi): 4 psi (-8) Pulsed Lavage with Suction - Normal Saline Used: 1000 mL Pulsed Lavage Tip: Tip with splash shield Selective Debridement Selective Debridement -  Location: R Ischium Selective Debridement - Tools Used: Forceps;Scissors Selective Debridement - Tissue Removed: necrotic muscle and connective tissues   Wound Assessment and Plan  Wound Therapy - Assess/Plan/Recommendations Wound Therapy - Clinical Statement: Pt benefited from PLS to soften tissues for selective debridement and to cleanse and hopefully decrease bacterial load. Wound Therapy - Functional Problem List: quadriplegia Factors Delaying/Impairing Wound Healing: Incontinence;Infection - systemic/local;Immobility Hydrotherapy Plan: Debridement;Dressing change;Patient/family education;Pulsatile lavage with suction Wound Therapy - Frequency: 3X / week Wound Plan: see above  Wound Therapy Goals- Improve the function of patient's integumentary system by progressing the wound(s) through the phases of wound healing (inflammation - proliferation - remodeling) by: Decrease Necrotic Tissue to: 0% Decrease Necrotic Tissue - Progress: Progressing toward goal Increase Granulation Tissue to: 100% including any healthy connective tissue Increase Granulation Tissue - Progress: Progressing toward goal  Goals will be updated until maximal potential achieved or discharge criteria met.  Discharge criteria: when goals achieved, discharge from hospital, MD decision/surgical intervention, no progress towards goals, refusal/missing three consecutive treatments without notification or medical reason.  GP     Helene Bernstein, Thornton Papas, [place] 10/08/2014, 1:04 PM

## 2014-10-09 LAB — BASIC METABOLIC PANEL
Anion gap: 11 (ref 5–15)
BUN: 8 mg/dL (ref 6–23)
CO2: 22 mmol/L (ref 19–32)
Calcium: 9.4 mg/dL (ref 8.4–10.5)
Chloride: 102 mEq/L (ref 96–112)
Creatinine, Ser: 0.54 mg/dL (ref 0.50–1.35)
GFR calc non Af Amer: >90 mL/min (ref >90)
GLUCOSE: 75 mg/dL (ref 70–99)
Potassium: 4.3 mmol/L (ref 3.5–5.1)
SODIUM: 135 mmol/L (ref 135–145)

## 2014-10-09 LAB — GLUCOSE, CAPILLARY
GLUCOSE-CAPILLARY: 66 mg/dL — AB (ref 70–99)
Glucose-Capillary: 83 mg/dL (ref 70–99)
Glucose-Capillary: 96 mg/dL (ref 70–99)
Glucose-Capillary: 197 mg/dL — ABNORMAL HIGH (ref 70–99)
Glucose-Capillary: 125 mg/dL — ABNORMAL HIGH (ref 70–99)

## 2014-10-09 NOTE — Progress Notes (Signed)
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  Now 2 weeks off vancomycin, chronic indwelling Foley catheter, not on home oxygen, presented to ED with altered mental status/somnolence, leukocytosis, fever and chills. Infectious disease consulting. Patient sepsis attributed to right ischial tuberosity decubitus ulcer. Surgery consulted and patient underwent debridement and bone biopsy for osteomyelitis on 12/28. Bone biopsy results pending. PICC line placed. ID recommending 6-8 weeks of IV vancomycin.   Subjective: - feels well, no chest pain/abdominal pain/shortness of breath  Assessment/Plan:  Sepsis/fever:  Likely from infected right ischial tuberosity decubitus ulcer with underlying osteomyelitis. Foley catheter changed this admission. Blood cultures 2 negative to date. Persistent left lower lobe consolidation on chest x-ray but no respiratory symptoms. Recently treated for MSSA bacteremia. Recent TEE was negative. Wound care consultation appreciated. Continue IV vancomycin. Aztreonam discontinued by ID on 12/26. No fevers, sepsis physiology resolved  Sacral decubitus ulcer/pelvic OM:  MRI shows osteomyelitis of right inferior pubic ramus, but no osteo in smaller left distal tuberosity.  Status post right decubitus ulcer debridement 12/28. Patient has multiple sacral/buttock stage III ulcers, right ischial stage IV ulcer, left foot with several full-thickness wounds, fifth toe and fourth toe wound. Management per wound care team. WOC has recommended hydrotherapy and wound vacs as appropriate.  Surgery planning diverting ostomy for next week.  Pathology results are below.  Mild hyperkalemia  Potassium trending up despite good kidney  function, unclear etiology, CBGs with good control, renal function intact, ? Multiple po supplements and increased intake - s/p Kayexelate 12/31, K better and stable  Acute encephalopathy: Secondary to problem #1. Resolved. No focal deficits.  Acute kidney injury: Related to sepsis and dehydration. Resolved.  Type II DM/IDDM: Brittle with some episodes of hypoglycemia this admission.  Continue Lantus and SSI and adjust as needed. Will adjust insulins for uncontrolled DM 2. Will decrease lantus from 30 to 26 units on 12/31.  History of severe protein calorie malnutrition: Continue nutritional supplements.  History of GERD: Continue PPI.  History of spinal cord infarction and quadriparesis: Paraplegic in lower extremities and weak in the upper extremities. Patient has had an indwelling Foley catheter since April 2015.  Dehydration with hyponatremia: resolved. DC IVF  Leukocytosis: Secondary to problem #1. Follow CBCs. Improving.  Thrombocytosis:  Likely reactive to wounds and wound treatment.  History of recently treated MSSA bacteremia: Continue above antibiotics and follow blood culture results.  Hypertension: Soft blood pressures. Hold antihypertensives.  Chronic benzodiazepine use: Continue same.  Chronic Foley catheter and traumatic hypospadias: Patient states that urology had consulted in the past and recommended suprapubic catheter but he was not medically stable to get it at that time. Discussed with urologist on call Dr. Era Bumpers on 12/24 would recommended looping the Foley catheter and taping it to abdomen to reduce gravity related further trauma to penis and decreasing the chance of urethral stricture. He also recommended Neosporin ointment or the junction of the Foley catheter in the penis and outpatient follow-up with urology to evaluate whether he would benefit from a suprapubic catheter. As per mother, follows with Dr. [facility]Diarm[facility]  Acute on chronic Anemia: Likely  secondary to acute illness and dilutional. No reported bleeding. Stable  Code Status:  Full Family Communication:  Discussed with mother Disposition Plan:  Inpatient. D/c to home  with full home health services when ready.  Consultants:  Infectious disease  General surgery  Procedures:  Changed Foley catheter  Debridement of decubitus ulcer 12/28  PICC line placed 12/28  Antibiotics:  IV vancomycin and 12/23 >  IV aztreonam 12/23 > 12/26  Objective: Filed Vitals:   10/08/14 1251 10/08/14 2220 10/09/14 0557 10/09/14 1442  BP: 101/62 109/69 102/66 109/74  Pulse: 88 87 84 95  Temp: 99.1 F (37.3 C) 99.2 F (37.3 C) 98.9 F (37.2 C) 99.1 F (37.3 C)  TempSrc: Oral Oral Oral Oral  Resp: 20 20 20 15   Height:      Weight:      SpO2: 98% 98% 98% 100%    Intake/Output Summary (Last 24 hours) at 10/09/14 1442 Last data filed at 10/09/14 0700  Gross per 24 hour  Intake    440 ml  Output   2350 ml  Net  -1910 ml   Filed Weights   10/01/14 0408 10/02/14 0455 10/02/14 1841  Weight: 55.4 kg (122 lb 2.2 oz) 55.3 kg (121 lb 14.6 oz) 57 kg (125 lb 10.6 oz)   Exam: General exam:   thinly nourished chronically ill-looking young male,  Sleeping with headphones on.  Mother at bedside. Respiratory system: Clear. No increased work of breathing. Cardiovascular system: S1 & S2 heard, RRR. No JVD, murmurs, gallops, clicks or pedal edema. Gastrointestinal system: Abdomen is nondistended, soft and nontender. Normal bowel sounds heard. Extremities:  Grade 0 x 5 power in lower extremities and grade 3 x 5 power in upper extremities proximally and 0 x 5 power distally with contractures of fingers. Skin: Decubitus/surgical site dressing clean and dry.  Data Reviewed: Basic Metabolic Panel:  Recent Labs Lab 10/05/14 0500 10/06/14 0513 10/07/14 0545 10/08/14 0515 10/09/14 1125  NA 131* 132* 133* 132* 135  K 4.3 5.0 5.3* 4.3 4.3  CL 101 102 102 100 102  CO2 26 26 25 27 22     GLUCOSE 270* 246* 195* 235* 75  BUN 13 14 11 8 8   CREATININE 0.62 0.61 0.62 0.60 0.54  CALCIUM 9.1 8.8 9.5 9.1 9.4   Liver Function Tests:  Recent Labs Lab 10/05/14 0500  AST 12  ALT 8  ALKPHOS 87  BILITOT 0.2*  PROT 6.8  ALBUMIN 2.1*   CBC:  Recent Labs Lab 10/03/14 0717 10/05/14 0500 10/06/14 0513 10/07/14 0545 10/08/14 0515  WBC 13.7* 12.5* 14.5* 12.0* 11.6*  HGB 9.4* 8.9* 8.4* 8.5* 8.0*  HCT 28.9* 28.0* 26.7* 27.0* 25.2*  MCV 81.2 81.9 82.4 84.6 85.4  PLT 597* 718* 690* 712* 716*   BNP (last 3 results)  Recent Labs  01/29/14 2113  PROBNP 1343.0*   CBG:  Recent Labs Lab 10/08/14 1148 10/08/14 1628 10/08/14 2245 10/09/14 0814 10/09/14 1205  GLUCAP 156* 162* 104* 83 96   Surgical Pathology 10/04/2014 1.  Debridement, Sacral decubitus - BENIGN SKIN AND SUBCUTANEOUS SOFT TISSUE WITH FIBROSIS, NECROSIS, AND ACUTE INFLAMMATION. - NEGATIVE FOR MALIGNANCY. 2. Debridement, Right ischium - BENIGN SKIN AND SUBCUTANEOUS SOFT TISSUE WITH NECROSIS, FIBROSIS, ACUTE AND CHRONIC INFLAMMATION AND ABSCESS FORMATION. - NEGATIVE FOR MALIGNANCY.   Recent Results (from the past 240 hour(s))  Urine culture     Status: None   Collection Time: 09/29/14  6:28 PM  Result Value Ref Range Status   Specimen Description URINE, CLEAN CATCH  Final   Special Requests NONE  Final   Culture  Setup Time   Final    09/30/2014 02:39 Performed at  [facility]    Colony Count NO GROWTH Performed at [facility]   Final   Culture NO GROWTH Performed at [facility]   Final   Report Status 09/30/2014 FINAL  Final  MRSA PCR Screening     Status: None   Collection Time: 10/04/14  3:22 AM  Result Value Ref Range Status   MRSA by PCR NEGATIVE NEGATIVE Final    Comment:        The GeneXpert MRSA Assay (FDA approved for NASAL specimens only), is one component of a comprehensive MRSA colonization surveillance program. It is not intended to diagnose  MRSA infection nor to guide or monitor treatment for MRSA infections.   Tissue culture     Status: None   Collection Time: 10/04/14  1:46 PM  Result Value Ref Range Status   Specimen Description TISSUE SACRAL  Final   Special Requests NONE  Final   Gram Stain   Final    NO WBC SEEN NO ORGANISMS SEEN Performed at [facility]    Culture   Final    MULTIPLE ORGANISMS PRESENT, NONE PREDOMINANT Note: NO STAPHYLOCOCCUS AUREUS ISOLATED NO GROUP A STREP (S.PYOGENES) ISOLATED Performed at [facility]    Report Status 10/07/2014 FINAL  Final  Anaerobic culture     Status: None (Preliminary result)   Collection Time: 10/04/14  1:46 PM  Result Value Ref Range Status   Specimen Description TISSUE SACRAL  Final   Special Requests NONE  Final   Gram Stain   Final    NO WBC SEEN NO ORGANISMS SEEN Performed at [facility]    Culture   Final    NO ANAEROBES ISOLATED; CULTURE IN PROGRESS FOR 5 DAYS Performed at [facility]    Report Status PENDING  Incomplete  Clostridium Difficile by PCR     Status: None   Collection Time: 10/06/14  2:58 PM  Result Value Ref Range Status   C difficile by pcr NEGATIVE NEGATIVE Final      Studies: No results found.   Scheduled Meds: . antiseptic oral rinse  7 mL Mouth Rinse BID  . atorvastatin  20 mg Oral Daily  . bacitracin   Topical BID  . collagenase   Topical Daily  . feeding supplement (GLUCERNA SHAKE)  237 mL Oral TID BM  . heparin  5,000 Units Subcutaneous 3 times per day  . insulin aspart  0-15 Units Subcutaneous TID WC  . insulin aspart  0-5 Units Subcutaneous QHS  . insulin aspart  6 Units Subcutaneous TID WC  . insulin glargine  26 Units Subcutaneous QHS  . loratadine  10 mg Oral Daily  . metoCLOPramide  5 mg Oral Once per day on Mon Thu  . mupirocin cream   Topical BID  . pantoprazole  40 mg Oral Daily  . polyethylene glycol  17 g Oral Daily  . pregabalin  100 mg Oral QID  . sodium chloride   3 mL Intravenous Q12H  . vancomycin  500 mg Intravenous Q12H   Continuous Infusions:    Principal Problem:   Osteomyelitis, pelvis Active Problems:   GERD   Protein-calorie malnutrition, severe   IDDM (insulin dependent diabetes mellitus)   Spinal cord infarction (history of)   Sepsis   UTI (lower urinary tract infection)   Cellulitis and abscess of leg, except foot   Abscess   Staphylococcus aureus bacteremia with sepsis   AKI (acute kidney injury)  Costin M.  Cruzita Lederer, MD [facility] [phone]  If 7PM-7AM, please contact night-coverage [email or web address] Password [email or web address] 10/09/2014, 2:42 PM    LOS: 10 days

## 2014-10-09 NOTE — Progress Notes (Signed)
Hypoglycemic Event  CBG: 66  Treatment: 15 GM carbohydrate snack  Symptoms: None  Follow-up CBG: Time:2244 CBG Result:125  Possible Reasons for Event: Unknown  Comments/MD notified:On-call provider T Callahan notified via textpage. Order received to hold tonight lantus.    Jason [name]  Remember to initiate Hypoglycemia Order Set & complete

## 2014-10-10 ENCOUNTER — Inpatient Hospital Stay (HOSPITAL_COMMUNITY): Payer: Medicaid Other

## 2014-10-10 LAB — ANAEROBIC CULTURE: GRAM STAIN: NONE SEEN

## 2014-10-10 LAB — BASIC METABOLIC PANEL
Anion gap: 4 — ABNORMAL LOW (ref 5–15)
BUN: 8 mg/dL (ref 6–23)
CO2: 28 mmol/L (ref 19–32)
Calcium: 9.2 mg/dL (ref 8.4–10.5)
Chloride: 99 mEq/L (ref 96–112)
Creatinine, Ser: 0.67 mg/dL (ref 0.50–1.35)
GFR calc non Af Amer: >90 mL/min (ref >90)
Glucose, Bld: 121 mg/dL — ABNORMAL HIGH (ref 70–99)
Potassium: 4.8 mmol/L (ref 3.5–5.1)
Sodium: 131 mmol/L — ABNORMAL LOW (ref 135–145)

## 2014-10-10 LAB — URINALYSIS, ROUTINE W REFLEX MICROSCOPIC
BILIRUBIN URINE: NEGATIVE
Glucose, UA: NEGATIVE mg/dL
Ketones, ur: NEGATIVE mg/dL
NITRITE: NEGATIVE
PH: 7 (ref 5.0–8.0)
PROTEIN: NEGATIVE mg/dL
SPECIFIC GRAVITY, URINE: 1.009 (ref 1.005–1.030)
Urobilinogen, UA: 0.2 mg/dL (ref 0.0–1.0)

## 2014-10-10 LAB — URINE MICROSCOPIC-ADD ON

## 2014-10-10 LAB — GLUCOSE, CAPILLARY
Glucose-Capillary: 138 mg/dL — ABNORMAL HIGH (ref 70–99)
Glucose-Capillary: 88 mg/dL (ref 70–99)
Glucose-Capillary: 78 mg/dL (ref 70–99)
Glucose-Capillary: 262 mg/dL — ABNORMAL HIGH (ref 70–99)

## 2014-10-10 MED ORDER — INSULIN ASPART 100 UNIT/ML ~~LOC~~ SOLN
3.0000 [IU] | Freq: Three times a day (TID) | SUBCUTANEOUS | Status: DC
  Administered 2014-10-11 – 2014-10-14 (×8): 3 [IU] via SUBCUTANEOUS

## 2014-10-10 MED ORDER — LEVOFLOXACIN 500 MG PO TABS
500.0000 mg | ORAL_TABLET | Freq: Every day | ORAL | Status: DC
  Administered 2014-10-10 – 2014-10-13 (×3): 500 mg via ORAL
  Filled 2014-10-10 (×4): qty 1

## 2014-10-10 NOTE — Progress Notes (Signed)
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  Now 2 weeks off vancomycin, chronic indwelling Foley catheter, not on home oxygen, presented to ED with altered mental status/somnolence, leukocytosis, fever and chills. Infectious disease consulting. Patient sepsis attributed to right ischial tuberosity decubitus ulcer. Surgery consulted and patient underwent debridement and bone biopsy for osteomyelitis on 12/28. Bone biopsy results pending. PICC line placed. ID recommending 6-8 weeks of IV vancomycin.   Subjective: - feels well, no chest pain/abdominal pain/shortness of breath  Assessment/Plan:  Sepsis/fever:  Likely from infected right ischial tuberosity decubitus ulcer with underlying osteomyelitis. Foley catheter changed this admission. Blood cultures 2 negative to date. Persistent left lower lobe consolidation on chest x-ray but no respiratory symptoms. Recently treated for MSSA bacteremia. Recent TEE was negative. Wound care consultation appreciated. Continue IV vancomycin. Aztreonam discontinued by ID on 12/26.  - His fever curve has improved, however in the last couple of days has been trending up, temperature was 100 this morning, patient with a mild cough, nonproductive. Obtain a chest x-ray and a urinalysis to further investigate  Sacral decubitus ulcer/pelvic OM:  MRI shows osteomyelitis of right inferior pubic ramus, but no osteo in smaller left distal tuberosity.  Status post right decubitus ulcer debridement 12/28. Patient has multiple sacral/buttock stage III ulcers, right ischial stage IV ulcer, left foot with several full-thickness wounds, fifth toe and fourth toe wound. Management per wound care team. WOC has recommended  hydrotherapy and wound vacs as appropriate.  Surgery planning diverting ostomy for next week.  Pathology results are below.  Mild hyperkalemia  Potassium trending up despite good kidney function, unclear etiology, CBGs with good control, renal function intact, ? Multiple po supplements and increased intake - s/p Kayexelate 12/31, K better and stable  Acute encephalopathy: Secondary to problem #1. Resolved. No focal deficits.  Acute kidney injury: Related to sepsis and dehydration. Resolved.  Type II DM/IDDM: Brittle with some episodes of hypoglycemia this admission.  Continue Lantus and SSI and adjust as needed. Will adjust insulins for uncontrolled DM 2. Will decrease lantus from 30 to 26 units on 12/31.  History of severe protein calorie malnutrition: Continue nutritional supplements.  History of GERD: Continue PPI.  History of spinal cord infarction and quadriparesis: Paraplegic in lower extremities and weak in the upper extremities. Patient has had an indwelling Foley catheter since April 2015.  Dehydration with hyponatremia: resolved. DC IVF  Leukocytosis: Secondary to problem #1. Follow CBCs. Improving.  Thrombocytosis:  Likely reactive to wounds and wound treatment.  History of recently treated MSSA bacteremia: Continue above antibiotics and follow blood culture results.  Hypertension: Soft blood pressures. Hold antihypertensives.  Chronic benzodiazepine use: Continue same.  Chronic Foley catheter and traumatic hypospadias: Patient states that urology had consulted in the past and recommended suprapubic catheter but he was not medically stable to get it at that time. Discussed with urologist on call Dr. Era Bumpers on 12/24 would recommended looping the Foley catheter and taping it to abdomen to reduce gravity related further trauma to penis and decreasing the chance of urethral stricture. He also recommended Neosporin ointment or the junction of the Foley catheter in the penis and  outpatient follow-up with urology to evaluate whether he would benefit from a suprapubic catheter. As per mother, follows with Dr. McDiarmid [facility]  Acute on chronic Anemia: Likely secondary to acute illness and dilutional. No reported bleeding. Stable  Code Status:  Full Family Communication:  Discussed with mother Disposition Plan:  Inpatient. D/c to home with full home health services when ready.  Consultants:  Infectious disease  General surgery  Procedures:  Changed Foley catheter  Debridement of decubitus ulcer 12/28  PICC line placed 12/28  Antibiotics:  IV vancomycin and 12/23 >  IV aztreonam 12/23 > 12/26  Objective: Filed Vitals:   10/09/14 1442 10/09/14 2117 10/09/14 2256 10/10/14 0626  BP: 109/74 96/60 120/72 110/67  Pulse: 95 90 112 96  Temp: 99.1 F (37.3 C) 99.7 F (37.6 C) 98 F (36.7 C) 100 F (37.8 C)  TempSrc: Oral Oral Oral Oral  Resp: 15 20  20   Height:      Weight:      SpO2: 100% 100% 100% 100%    Intake/Output Summary (Last 24 hours) at 10/10/14 0832 Last data filed at 10/10/14 1610  Gross per 24 hour  Intake    740 ml  Output   1450 ml  Net   -710 ml   Filed Weights   10/01/14 0408 10/02/14 0455 10/02/14 1841  Weight: 55.4 kg (122 lb 2.2 oz) 55.3 kg (121 lb 14.6 oz) 57 kg (125 lb 10.6 oz)   Exam: General exam:   thinly nourished chronically ill-looking young male Respiratory system: Clear. No increased work of breathing. Cardiovascular system: S1 & S2 heard, RRR. No JVD, murmurs, gallops, clicks or pedal edema. Gastrointestinal system: Abdomen is nondistended, soft and nontender. Normal bowel sounds heard. Extremities:  Grade 0 x 5 power in lower extremities and grade 3 x 5 power in upper extremities proximally and 0 x 5 power distally with contractures of fingers. Skin: Decubitus/surgical site dressing clean and dry.  Data Reviewed: Basic Metabolic Panel:  Recent Labs Lab 10/06/14 0513 10/07/14 0545 10/08/14 0515  10/09/14 1125 10/10/14 0605  NA 132* 133* 132* 135 131*  K 5.0 5.3* 4.3 4.3 4.8  CL 102 102 100 102 99  CO2 26 25 27 22 28   GLUCOSE 246* 195* 235* 75 121*  BUN 14 11 8 8 8   CREATININE 0.61 0.62 0.60 0.54 0.67  CALCIUM 8.8 9.5 9.1 9.4 9.2   Liver Function Tests:  Recent Labs Lab 10/05/14 0500  AST 12  ALT 8  ALKPHOS 87  BILITOT 0.2*  PROT 6.8  ALBUMIN 2.1*   CBC:  Recent Labs Lab 10/05/14 0500 10/06/14 0513 10/07/14 0545 10/08/14 0515  WBC 12.5* 14.5* 12.0* 11.6*  HGB 8.9* 8.4* 8.5* 8.0*  HCT 28.0* 26.7* 27.0* 25.2*  MCV 81.9 82.4 84.6 85.4  PLT 718* 690* 712* 716*   BNP (last 3 results)  Recent Labs  01/29/14 2113  PROBNP 1343.0*   CBG:  Recent Labs Lab 10/09/14 0814 10/09/14 1205 10/09/14 1648 10/09/14 2219 10/09/14 2244  GLUCAP 83 96 197* 66* 125*   Surgical Pathology 10/04/2014 1.  Debridement, Sacral decubitus - BENIGN SKIN AND SUBCUTANEOUS SOFT TISSUE WITH FIBROSIS, NECROSIS, AND ACUTE INFLAMMATION. - NEGATIVE FOR MALIGNANCY. 2. Debridement, Right ischium - BENIGN SKIN AND SUBCUTANEOUS SOFT TISSUE WITH NECROSIS, FIBROSIS, ACUTE AND CHRONIC INFLAMMATION AND ABSCESS FORMATION. - NEGATIVE FOR MALIGNANCY.   Recent Results (from the past 240 hour(s))  MRSA PCR Screening     Status: None   Collection Time: 10/04/14  3:22 AM  Result Value Ref Range Status   MRSA by PCR NEGATIVE NEGATIVE Final    Comment:  The GeneXpert MRSA Assay (FDA approved for NASAL specimens only), is one component of a comprehensive MRSA colonization surveillance program. It is not intended to diagnose MRSA infection nor to guide or monitor treatment for MRSA infections.   Tissue culture     Status: None   Collection Time: 10/04/14  1:46 PM  Result Value Ref Range Status   Specimen Description TISSUE SACRAL  Final   Special Requests NONE  Final   Gram Stain   Final    NO WBC SEEN NO ORGANISMS SEEN Performed at [facility]    Culture    Final    MULTIPLE ORGANISMS PRESENT, NONE PREDOMINANT Note: NO STAPHYLOCOCCUS AUREUS ISOLATED NO GROUP A STREP (S.PYOGENES) ISOLATED Performed at [facility]    Report Status 10/07/2014 FINAL  Final  Anaerobic culture     Status: None (Preliminary result)   Collection Time: 10/04/14  1:46 PM  Result Value Ref Range Status   Specimen Description TISSUE SACRAL  Final   Special Requests NONE  Final   Gram Stain   Final    NO WBC SEEN NO ORGANISMS SEEN Performed at [facility]    Culture   Final    NO ANAEROBES ISOLATED; CULTURE IN PROGRESS FOR 5 DAYS Performed at [facility]    Report Status PENDING  Incomplete  Clostridium Difficile by PCR     Status: None   Collection Time: 10/06/14  2:58 PM  Result Value Ref Range Status   C difficile by pcr NEGATIVE NEGATIVE Final      Studies: No results found.   Scheduled Meds: . antiseptic oral rinse  7 mL Mouth Rinse BID  . atorvastatin  20 mg Oral Daily  . bacitracin   Topical BID  . collagenase   Topical Daily  . feeding supplement (GLUCERNA SHAKE)  237 mL Oral TID BM  . heparin  5,000 Units Subcutaneous 3 times per day  . insulin aspart  0-15 Units Subcutaneous TID WC  . insulin aspart  0-5 Units Subcutaneous QHS  . insulin aspart  6 Units Subcutaneous TID WC  . insulin glargine  26 Units Subcutaneous QHS  . loratadine  10 mg Oral Daily  . metoCLOPramide  5 mg Oral Once per day on Mon Thu  . mupirocin cream   Topical BID  . pantoprazole  40 mg Oral Daily  . polyethylene glycol  17 g Oral Daily  . pregabalin  100 mg Oral QID  . sodium chloride  3 mL Intravenous Q12H  . vancomycin  500 mg Intravenous Q12H   Continuous Infusions:    Principal Problem:   Osteomyelitis, pelvis Active Problems:   GERD   Protein-calorie malnutrition, severe   IDDM (insulin dependent diabetes mellitus)   Spinal cord infarction (history of)   Sepsis   UTI (lower urinary tract infection)   Cellulitis and abscess  of leg, except foot   Abscess   Staphylococcus aureus bacteremia with sepsis   AKI (acute kidney injury)  Costin M. Cruzita Lederer, MD [facility] [phone]  If 7PM-7AM, please contact night-coverage [email or web address] Password [email or web address] 10/10/2014, 8:32 AM    LOS: 11 days

## 2014-10-10 NOTE — Progress Notes (Signed)
ANTIBIOTIC CONSULT NOTE - FOLLOW UP  Pharmacy Consult for Vancomycin Indication: wound infection  Allergies  Allergen Reactions  . Cefuroxime Axetil Anaphylaxis  . Penicillins Anaphylaxis and Other (See Comments)    ?can take amoxicillin?  [name] [Benzonatate] Anaphylaxis  . Shellfish Allergy Itching and Other (See Comments)    Took benadryl to alleviate reaction    Patient Measurements: Height: 5\' 8"  (172.7 cm) Weight: 125 lb 10.6 oz (57 kg) IBW/kg (Calculated) : 68.4  Vital Signs: Temp: 100 F (37.8 C) (01/03 0626) Temp Source: Oral (01/03 0626) BP: 110/67 mmHg (01/03 0626) Pulse Rate: 96 (01/03 0626) Intake/Output from previous day: 01/02 0701 - 01/03 0700 In: 740 [P.O.:540; IV Piggyback:200] Out: 1450 [Urine:1450] Intake/Output from this shift: Total I/O In: 360 [P.O.:360] Out: -   Labs:  Recent Labs  10/08/14 0515 10/09/14 1125 10/10/14 0605  WBC 11.6*  --   --   HGB 8.0*  --   --   PLT 716*  --   --   CREATININE 0.60 0.54 0.67   Estimated Creatinine Clearance: 108.9 mL/min (by C-G formula based on Cr of 0.67). No results for input(s): VANCOTROUGH, VANCOPEAK, VANCORANDOM, GENTTROUGH, GENTPEAK, GENTRANDOM, TOBRATROUGH, TOBRAPEAK, TOBRARND, AMIKACINPEAK, AMIKACINTROU, AMIKACIN in the last 72 hours.   Microbiology: Recent Results (from the past 720 hour(s))  Blood Culture (routine x 2)     Status: None   Collection Time: 09/16/14  3:25 AM  Result Value Ref Range Status   Specimen Description BLOOD RIGHT ARM  Final   Special Requests BOTTLES DRAWN AEROBIC AND ANAEROBIC 10CC  Final   Culture  Setup Time   Final    09/16/2014 08:58 Performed at [place]   Final    NO GROWTH 5 DAYS Performed at [facility]    Report Status 09/22/2014 FINAL  Final  Blood Culture (routine x 2)     Status: None   Collection Time: 09/16/14  3:30 AM  Result Value Ref Range Status   Specimen Description BLOOD LEFT ARM  Final   Special  Requests BOTTLES DRAWN AEROBIC ONLY 10CC  Final   Culture  Setup Time   Final    09/16/2014 08:58 Performed at [place]   Final    NO GROWTH 5 DAYS Performed at [facility]    Report Status 09/22/2014 FINAL  Final  Urine culture     Status: None   Collection Time: 09/16/14  4:09 AM  Result Value Ref Range Status   Specimen Description URINE, RANDOM  Final   Special Requests NONE  Final   Culture  Setup Time   Final    09/16/2014 08:45 Performed at [place]   Final    >=100,000 COLONIES/ML Performed at [place]   Final    KLEBSIELLA SPECIES Performed at [facility]    Report Status 09/18/2014 FINAL  Final   Organism ID, Bacteria KLEBSIELLA SPECIES  Final      Susceptibility   Klebsiella species - MIC*    AMPICILLIN >=32 RESISTANT Resistant     CEFAZOLIN <=4 SENSITIVE Sensitive     CEFTRIAXONE <=1 SENSITIVE Sensitive     CIPROFLOXACIN <=0.25 SENSITIVE Sensitive     GENTAMICIN <=1 SENSITIVE Sensitive     NITROFURANTOIN <=16 SENSITIVE Sensitive     TOBRAMYCIN <=1 SENSITIVE Sensitive     TRIMETH/SULFA <=20 SENSITIVE Sensitive  PIP/TAZO 8 SENSITIVE Sensitive     * KLEBSIELLA SPECIES  MRSA PCR Screening     Status: None   Collection Time: 09/16/14  7:08 AM  Result Value Ref Range Status   MRSA by PCR NEGATIVE NEGATIVE Final    Comment:        The GeneXpert MRSA Assay (FDA approved for NASAL specimens only), is one component of a comprehensive MRSA colonization surveillance program. It is not intended to diagnose MRSA infection nor to guide or monitor treatment for MRSA infections.   Culture, respiratory (tracheal aspirate)     Status: None   Collection Time: 09/17/14  6:19 AM  Result Value Ref Range Status   Specimen Description TRACHEAL ASPIRATE  Final   Special Requests NONE  Final   Gram Stain   Final    FEW WBC PRESENT,BOTH PMN AND MONONUCLEAR RARE SQUAMOUS  EPITHELIAL CELLS PRESENT NO ORGANISMS SEEN Performed at [facility]    Culture   Final    FEW STAPHYLOCOCCUS AUREUS Note: RIFAMPIN AND GENTAMICIN SHOULD NOT BE USED AS SINGLE DRUGS FOR TREATMENT OF STAPH INFECTIONS. Performed at [facility]    Report Status 09/20/2014 FINAL  Final   Organism ID, Bacteria STAPHYLOCOCCUS AUREUS  Final      Susceptibility   Staphylococcus aureus - MIC*    CLINDAMYCIN >=8 RESISTANT Resistant     ERYTHROMYCIN >=8 RESISTANT Resistant     GENTAMICIN <=0.5 SENSITIVE Sensitive     LEVOFLOXACIN 0.25 SENSITIVE Sensitive     OXACILLIN 1 SENSITIVE Sensitive     PENICILLIN >=0.5 RESISTANT Resistant     RIFAMPIN <=0.5 SENSITIVE Sensitive     TRIMETH/SULFA <=10 SENSITIVE Sensitive     VANCOMYCIN 1 SENSITIVE Sensitive     TETRACYCLINE >=16 RESISTANT Resistant     MOXIFLOXACIN <=0.25 SENSITIVE Sensitive     * FEW STAPHYLOCOCCUS AUREUS  Clostridium Difficile by PCR     Status: None   Collection Time: 09/21/14 10:10 PM  Result Value Ref Range Status   C difficile by pcr NEGATIVE NEGATIVE Final  Blood Culture (routine x 2)     Status: None   Collection Time: 09/29/14 12:16 PM  Result Value Ref Range Status   Specimen Description BLOOD LEFT HAND  Final   Special Requests BOTTLES DRAWN AEROBIC AND ANAEROBIC 5CC  Final   Culture  Setup Time   Final    09/29/2014 18:08 Performed at [place]   Final    NO GROWTH 5 DAYS Performed at [facility]    Report Status 10/05/2014 FINAL  Final  Blood Culture (routine x 2)     Status: None   Collection Time: 09/29/14  1:20 PM  Result Value Ref Range Status   Specimen Description BLOOD RIGHT ANTECUBITAL  Final   Special Requests BOTTLES DRAWN AEROBIC AND ANAEROBIC 5ML  Final   Culture  Setup Time   Final    09/29/2014 18:07 Performed at [place]   Final    NO GROWTH 5 DAYS Performed at [facility]    Report Status 10/05/2014 FINAL   Final  Urine culture     Status: None   Collection Time: 09/29/14  6:28 PM  Result Value Ref Range Status   Specimen Description URINE, CLEAN CATCH  Final   Special Requests NONE  Final   Culture  Setup Time   Final    09/30/2014 02:39 Performed at [facility]  Colony Count NO GROWTH Performed at [facility]   Final   Culture NO GROWTH Performed at [facility]   Final   Report Status 09/30/2014 FINAL  Final  MRSA PCR Screening     Status: None   Collection Time: 10/04/14  3:22 AM  Result Value Ref Range Status   MRSA by PCR NEGATIVE NEGATIVE Final    Comment:        The GeneXpert MRSA Assay (FDA approved for NASAL specimens only), is one component of a comprehensive MRSA colonization surveillance program. It is not intended to diagnose MRSA infection nor to guide or monitor treatment for MRSA infections.   Tissue culture     Status: None   Collection Time: 10/04/14  1:46 PM  Result Value Ref Range Status   Specimen Description TISSUE SACRAL  Final   Special Requests NONE  Final   Gram Stain   Final    NO WBC SEEN NO ORGANISMS SEEN Performed at [facility]    Culture   Final    MULTIPLE ORGANISMS PRESENT, NONE PREDOMINANT Note: NO STAPHYLOCOCCUS AUREUS ISOLATED NO GROUP A STREP (S.PYOGENES) ISOLATED Performed at [facility]    Report Status 10/07/2014 FINAL  Final  Anaerobic culture     Status: None (Preliminary result)   Collection Time: 10/04/14  1:46 PM  Result Value Ref Range Status   Specimen Description TISSUE SACRAL  Final   Special Requests NONE  Final   Gram Stain   Final    NO WBC SEEN NO ORGANISMS SEEN Performed at [facility]    Culture   Final    NO ANAEROBES ISOLATED; CULTURE IN PROGRESS FOR 5 DAYS Performed at [facility]    Report Status PENDING  Incomplete  Clostridium Difficile by PCR     Status: None   Collection Time: 10/06/14  2:58 PM  Result Value Ref Range Status    C difficile by pcr NEGATIVE NEGATIVE Final    Anti-infectives    Start     Dose/Rate Route Frequency Ordered Stop   10/08/14 1000  vancomycin (VANCOCIN) 500 mg in sodium chloride 0.9 % 100 mL IVPB     500 mg100 mL/hr over 60 Minutes Intravenous Every 12 hours 10/07/14 2225     09/30/14 1400  vancomycin (VANCOCIN) IVPB 1000 mg/200 mL premix  Status:  Discontinued     1,000 mg200 mL/hr over 60 Minutes Intravenous Every 24 hours 09/29/14 1326 09/29/14 1848   09/30/14 1400  vancomycin (VANCOCIN) IVPB 1000 mg/200 mL premix  Status:  Discontinued     1,000 mg200 mL/hr over 60 Minutes Intravenous Every 24 hours 09/29/14 1916 09/30/14 0845   09/30/14 1000  vancomycin (VANCOCIN) IVPB 750 mg/150 ml premix  Status:  Discontinued     750 mg150 mL/hr over 60 Minutes Intravenous Every 12 hours 09/30/14 0845 10/07/14 2225   09/29/14 2200  aztreonam (AZACTAM) 1 g in dextrose 5 % 50 mL IVPB  Status:  Discontinued     1 g100 mL/hr over 30 Minutes Intravenous 3 times per day 09/29/14 1326 09/29/14 1848   09/29/14 2200  aztreonam (AZACTAM) 1 g in dextrose 5 % 50 mL IVPB  Status:  Discontinued     1 g100 mL/hr over 30 Minutes Intravenous 3 times per day 09/29/14 1916 10/01/14 1426   09/29/14 1300  aztreonam (AZACTAM) 2 g in dextrose 5 % 50 mL IVPB     2 g100 mL/hr over 30 Minutes Intravenous  STAT 09/29/14 1239 09/29/14 1355   09/29/14 1245  vancomycin (VANCOCIN) IVPB 1000 mg/200 mL premix     1,000 mg200 mL/hr over 60 Minutes Intravenous  Once 09/29/14 1239 09/29/14 1530      Assessment: 29 yo m admitted on 12/23 for weakness and SOB. Admitted in November for MSSA bacteremia with MRI concerns for osteo under eschar on right side. Only received 4 weeks of planned 6-8 weeks of IV antibiotics.  Pharmacy is consulted to dose vancomycin. Wbc 11.6, afebrile, renal function stable.  Vanc 12/23 >> 12/25 VT 19 continue on 750 mg IV q12h 12/31 VT 24 decrease to 500 mg IV q12h  12/23 UCx: negative 12/23 Bld x2:  negative 12/31 tissue: negative  Goal of Therapy:  Vancomycin trough level 15-20 mcg/ml  Plan:  Continue vancomycin 500 mg IV q12h Monitor trough once weekly  Cassie L. Nicole Kindred, PharmD Clinical Pharmacy Resident Pager: [phone] 10/10/2014 11:23 AM

## 2014-10-11 LAB — CBC
HCT: 26.5 % — ABNORMAL LOW (ref 39.0–52.0)
Hemoglobin: 8.2 g/dL — ABNORMAL LOW (ref 13.0–17.0)
MCH: 25.8 pg — ABNORMAL LOW (ref 26.0–34.0)
MCHC: 30.9 g/dL (ref 30.0–36.0)
MCV: 83.3 fL (ref 78.0–100.0)
PLATELETS: 651 10*3/uL — AB (ref 150–400)
RBC: 3.18 MIL/uL — ABNORMAL LOW (ref 4.22–5.81)
RDW: 15.8 % — AB (ref 11.5–15.5)
WBC: 16.6 10*3/uL — ABNORMAL HIGH (ref 4.0–10.5)

## 2014-10-11 LAB — GLUCOSE, CAPILLARY
GLUCOSE-CAPILLARY: 132 mg/dL — AB (ref 70–99)
Glucose-Capillary: 269 mg/dL — ABNORMAL HIGH (ref 70–99)
Glucose-Capillary: 92 mg/dL (ref 70–99)
Glucose-Capillary: 155 mg/dL — ABNORMAL HIGH (ref 70–99)

## 2014-10-11 LAB — BASIC METABOLIC PANEL
Anion gap: 3 — ABNORMAL LOW (ref 5–15)
BUN: 12 mg/dL (ref 6–23)
CO2: 31 mmol/L (ref 19–32)
Calcium: 9.5 mg/dL (ref 8.4–10.5)
Chloride: 99 mEq/L (ref 96–112)
Creatinine, Ser: 0.78 mg/dL (ref 0.50–1.35)
GFR calc Af Amer: >90 mL/min (ref >90)
GFR calc non Af Amer: >90 mL/min (ref >90)
GLUCOSE: 266 mg/dL — AB (ref 70–99)
POTASSIUM: 4.7 mmol/L (ref 3.5–5.1)
Sodium: 133 mmol/L — ABNORMAL LOW (ref 135–145)

## 2014-10-11 MED ORDER — SIMETHICONE 80 MG PO CHEW
160.0000 mg | CHEWABLE_TABLET | Freq: Four times a day (QID) | ORAL | Status: DC | PRN
  Filled 2014-10-11: qty 2

## 2014-10-11 MED ORDER — SODIUM CHLORIDE 0.9 % IV SOLN: INTRAVENOUS | Status: DC

## 2014-10-11 MED ORDER — HEPARIN SODIUM (PORCINE) 5000 UNIT/ML IJ SOLN
5000.0000 [IU] | Freq: Three times a day (TID) | INTRAMUSCULAR | Status: DC
  Filled 2014-10-11: qty 1

## 2014-10-11 NOTE — Progress Notes (Addendum)
Paged on-call provider to confirm order for Lantus 26 units. Pt is NPO after MN for colostomy surgery tomorrow. Will await instruction from on-call provider. Will continue to monitor.

## 2014-10-11 NOTE — Consult Note (Signed)
WOC requested for preoperative stoma site marking  Discussed surgical procedure and stoma creation with patient and family.  Explained role of the [place] nurse team.  Provided the patient with educational booklet and provided samples of pouching options.  Answered patient and family questions.   Examined patient lying, sitting, (pt is unable to stand secondary to paraplegia) in order to place the marking in the patient's visual field, away from any creases or abdominal contour issues and within the rectus muscle.  Attempted to mark below the patient's belt line.    Marked for colostomy in the LLQ 5.5 cm to the left of the umbilicus and 3.0 cm below the umbilicus.  Wound note:  Pt to have hydrotherapy today and then we will DC this treatment.  Will re-evaluated wounds Wednesday after diverting ostomy complete to determine if the [facility] can be replaced on both the sacral and right ischial wounds.  I will plan to place a NPWT VAC dressing on the sacrum at least and with the next change any remaining stool should be out of the rectum and we can add the VAC to the right ischial wound at that time.   [place] team will follow up with patient after surgery for continue ostomy care and teaching.  Myla Mauriello [place] RN,CWOCN [phone]

## 2014-10-11 NOTE — Progress Notes (Signed)
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 (Active)  Dressing Type Moist to dry 10/11/2014  7:45 AM  Dressing Changed 10/11/2014  3:00 AM  Dressing Change Frequency Twice a day 10/11/2014  7:45 AM  Site / Wound Assessment Yellow;Red;Black 10/11/2014  3:00 AM  % Wound base Red or Granulating 40% 10/06/2014  2:28 AM  % Wound base Yellow 50% 10/06/2014  2:28 AM  % Wound base Black 10% 10/06/2014  2:28 AM  Peri-wound Assessment Bleeding 10/06/2014  2:28 AM  Wound Length (cm) 5.5 cm 10/06/2014  2:28 AM  Wound Width (cm) 7 cm 10/06/2014  2:28 AM  Wound Depth (cm) 1.5 cm 10/06/2014  2:28 AM  Margins Unattached edges (unapproximated) 10/11/2014  3:00 AM  Drainage Amount Moderate 10/11/2014  3:00 AM  Drainage Description Serosanguineous 10/11/2014  3:00 AM  Treatment Other (Comment) 10/11/2014  3:00 AM     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 (Active)  Dressing Type Gauze (Comment);Moist to dry 10/11/2014 11:47 AM  Dressing Clean;Dry;Intact;Changed 10/11/2014 11:47 AM  Dressing Change Frequency Monday, Wednesday, Friday 10/11/2014 11:47 AM  Site / Wound Assessment Red;Pink;Yellow;Bleeding 10/11/2014 11:47 AM  % Wound base Red or Granulating 75% 10/11/2014 11:47 AM  % Wound base Yellow 0% 10/11/2014 11:47 AM  % Wound base Other (Comment) 25% 10/11/2014 11:47 AM  Peri-wound Assessment Erythema (blanchable) 10/11/2014 11:47 AM  Wound Length (cm) 10 cm 10/06/2014 12:36 PM  Wound Width (cm) 7 cm  10/06/2014 12:36 PM  Wound Depth (cm) 2 cm 10/06/2014 12:36 PM  Tunneling (cm) 3.6 10/06/2014  2:28 AM  Margins Unattached edges (unapproximated) 10/11/2014 11:47 AM  Drainage Amount Minimal 10/11/2014 11:47 AM  Drainage Description Serous 10/11/2014 11:47 AM  Treatment Cleansed;Debridement (Selective);Hydrotherapy (Pulse lavage);Packing (Saline gauze) 10/11/2014 11:47 AM     Wound / Incision (Open or Dehisced) 09/16/14 Other (Comment) Toe (Comment  which one) Left (Active)  Dressing Type Gauze (Comment) 10/11/2014  7:45 AM  Dressing Changed Changed 10/10/2014  5:10 PM  Dressing Status Clean;Dry 10/11/2014  7:45 AM  Dressing Change Frequency Daily 10/10/2014  8:06 PM  Site / Wound Assessment Red 10/10/2014  5:10 PM  % Wound base Red or Granulating 95% 10/01/2014  8:00 AM  Peri-wound Assessment Pink 09/23/2014 11:00 AM  Margins Unattached edges (unapproximated) 10/10/2014  5:10 PM  Drainage Amount Scant 10/10/2014  5:10 PM  Drainage Description Serosanguineous 10/10/2014  5:10 PM  Non-staged Wound Description Partial thickness 10/01/2014  8:00 AM  Treatment Other (Comment) 10/10/2014  5:10 PM     Wound / Incision (Open or Dehisced) 09/30/14 Other (Comment) Sacrum Medial Reddened , Excoriated (Active)  Dressing Type Foam 10/04/2014  8:40 AM  Site / Wound Assessment Dressing in place / Unable to assess 10/06/2014  7:55 AM     Incision (Closed) 10/04/14 Buttocks Other (Comment) (Active)  Dressing Type Gauze (Comment) 10/06/2014  7:55 AM  Dressing Intact;Clean;Dry 10/06/2014  7:55 AM  Site / Wound Assessment Dressing in place / Unable to assess 10/06/2014  7:55 AM  Drainage Amount None 10/04/2014  3:15 PM   Hydrotherapy Pulsed lavage therapy - wound location: R [place] Pulsed Lavage with Suction (psi): 4 psi (-8) Pulsed Lavage with Suction - Normal Saline Used: 1000 mL Pulsed Lavage Tip: Tip with splash shield Selective Debridement Selective Debridement - Location: R [place] Selective Debridement - Tools  Used: Forceps;Scissors Selective Debridement - Tissue Removed: non viable connective tissues   Wound Assessment and Plan  Wound Therapy - Assess/Plan/Recommendations Wound Therapy - Clinical Statement: Pt benefited from PLS to soften tissues for selective debridement and to cleanse and hopefully decrease bacterial load. Wound Therapy - Functional Problem List: quadriplegia Factors Delaying/Impairing Wound Healing: Incontinence;Infection - systemic/local;Immobility Hydrotherapy Plan: Debridement;Dressing change;Patient/family education;Pulsatile lavage with suction Wound Therapy - Frequency: 3X / week Wound Plan: see above  Wound Therapy Goals- Improve the function of patient's integumentary system by progressing the wound(s) through the phases of wound healing (inflammation - proliferation - remodeling) by: Decrease Necrotic Tissue to: 0% Decrease Necrotic Tissue - Progress: Met Increase Granulation Tissue to: 100% including any healthy connective tissue Increase Granulation Tissue - Progress: Met  Goals will be updated until maximal potential achieved or discharge criteria met.  Discharge criteria: when goals achieved, discharge from hospital, MD decision/surgical intervention, no progress towards goals, refusal/missing three consecutive treatments without notification or medical reason.  [name], Jason [name] 10/11/2014, 11:50 AM  10/11/2014  Jason [name], [place] [phone]  (pager)

## 2014-10-11 NOTE — Progress Notes (Signed)
Hydrotherapy D/C Note.  At this time, pt's ischial wound is free of overt eschar and necrotic tissues.  Will sign off from acute hydrotherapy at this time.  Nursing and WOC, RN will continue management of the wound(s). 10/11/2014  Donnella Sham, [place] [phone]  (pager)

## 2014-10-11 NOTE — Progress Notes (Signed)
On-call provider instructed RN to give Lantus. Will give and continue to monitor.

## 2014-10-11 NOTE — Progress Notes (Signed)
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  Now 2 weeks off vancomycin, chronic indwelling Foley catheter, not on home oxygen, presented to ED with altered mental status/somnolence, leukocytosis, fever and chills. Infectious disease consulting. Patient sepsis attributed to right ischial tuberosity decubitus ulcer. Surgery consulted and patient underwent debridement and bone biopsy for osteomyelitis on 12/28. PICC line placed. ID recommending 6-8 weeks of IV vancomycin.   Subjective: Complains of bringing up thick yellow sputum yesterday morning, but none this morning.  Also complains of a feeling of abdominal fullness.  Has no appetite.  Assessment/Plan:  Sepsis/fever:  Likely from infected right ischial tuberosity decubitus ulcer with underlying osteomyelitis. Foley catheter changed this admission. Blood cultures 2 negative to date. Persistent left lower lobe consolidation on chest x-ray but no respiratory symptoms. Recently treated for MSSA bacteremia. Recent TEE was negative. Wound care consultation appreciated. Continue IV vancomycin. Aztreonam discontinued by ID on 12/26.   His fever curve has improved, however in the last couple of days has been trending up, temperature was 99.8 this morning, chest xray with infiltrate vs atelectasis.  Levaquin was started on 1/3.  U/A mildly positive.  Urine culture ordered.  Sacral decubitus ulcer/pelvic OM:  MRI shows osteomyelitis of right inferior pubic ramus, but no osteo in smaller left distal tuberosity.  Status post right decubitus ulcer debridement 12/28. Patient has multiple sacral/buttock stage III ulcers, right ischial stage IV ulcer, left foot with several full-thickness wounds, fifth  toe and fourth toe wound. Management per wound care team. WOC has recommended hydrotherapy and wound vacs as appropriate.  Surgery planning diverting ostomy for 1/5.  Infectious disease recommends 4-6 weeks of IV vancomycin.  PICC line in place.  Pathology results are below.  Chronic Foley catheter and traumatic hypospadias: Patient states that urology had consulted in the past and recommended suprapubic catheter but he was not medically stable to get it at that time. Discussed with urologist on call Dr. Era Bumpers on 12/24 would recommended looping the Foley catheter and taping it to abdomen to reduce gravity related further trauma to penis and decreasing the chance of urethral stricture. He also recommended Neosporin ointment or the junction of the Foley catheter in the penis and outpatient follow-up with urology to evaluate whether he would benefit from a suprapubic catheter. As per mother, follows with Dr. [facility]Diarm[facility].  Dr. Cruzita Lederer contacting Dr. McDiarmid regarding suprapubic catheter placement in combination with diverting ostomy placement. - patient will likely get suprapubic and diverting ostomy tomorrow.   Mild hyperkalemia  Potassium trending up despite good kidney function, unclear etiology, CBGs with good control, renal function intact, ? Multiple po supplements and increased intake.  S/p Kayexelate 12/31, K better but trends up slowly.  Will continue to monitor.  Acute encephalopathy: Secondary to problem #1. Resolved. No focal deficits.  Acute kidney injury: Related to sepsis and dehydration. Resolved.  Type II DM/IDDM: Brittle with some episodes of hypoglycemia this admission.  Continue Lantus and SSI and adjust as needed. Will adjust insulins for uncontrolled DM 2. Will decrease lantus from 30 to 26 units on 12/31.  History of severe protein calorie malnutrition: Continue nutritional supplements.  History of GERD: Continue PPI.  History of spinal cord infarction and  quadriparesis: Paraplegic in lower extremities and weak in the upper extremities.  Patient has had an indwelling Foley catheter since April 2015.  Dehydration with hyponatremia: resolved. DC IVF  Leukocytosis: Secondary to problem #1. Follow CBCs. Improving.  Thrombocytosis:  Likely reactive to wounds and wound treatment.  History of recently treated MSSA bacteremia: Continue above antibiotics and follow blood culture results.  Hypertension: Soft blood pressures. Hold antihypertensives.  Chronic benzodiazepine use: Continue same.  Acute on chronic Anemia: Likely secondary to acute illness and dilutional. No reported bleeding. Stable.    Code Status:  Full Family Communication:  Discussed with mother Disposition Plan:  Inpatient. D/c to home with full home health services when ready.  Consultants:  Infectious disease  General surgery  Urology   Procedures:  Changed Foley catheter  Debridement of decubitus ulcer 12/28  PICC line placed 12/28  Antibiotics:  IV vancomycin and 12/23 >  IV aztreonam 12/23 > 12/26  Levofloxacin 1/3 >>   Objective: Filed Vitals:   10/10/14 1343 10/10/14 2121 10/11/14 0517 10/11/14 0519  BP: 109/73 135/83  117/76  Pulse: 89 94 87 88  Temp: 99.1 F (37.3 C) 98.5 F (36.9 C)  99.8 F (37.7 C)  TempSrc: Oral   Oral  Resp: 16 18  18   Height:      Weight:      SpO2: 100% 91% 98% 97%    Intake/Output Summary (Last 24 hours) at 10/11/14 1404 Last data filed at 10/11/14 1017  Gross per 24 hour  Intake    580 ml  Output   4200 ml  Net  -3620 ml   Filed Weights   10/01/14 0408 10/02/14 0455 10/02/14 1841  Weight: 55.4 kg (122 lb 2.2 oz) 55.3 kg (121 lb 14.6 oz) 57 kg (125 lb 10.6 oz)   Exam: General exam:   thinly nourished chronically ill-looking young male.  Alert, orientated with clear speech. Respiratory system: Clear. No increased work of breathing. Cardiovascular system: S1 & S2 heard, RRR. No JVD, murmurs, gallops, clicks  or pedal edema. Gastrointestinal system: Abdomen is thin, nondistended, soft and nontender. Normal bowel sounds heard. Extremities:  Grade 0 x 5 power in lower extremities and grade 3 x 5 power in upper extremities proximally and 0 x 5 power distally with contractures of fingers. Skin: Decubitus/surgical site dressing clean and dry.  Data Reviewed: Basic Metabolic Panel:  Recent Labs Lab 10/07/14 0545 10/08/14 0515 10/09/14 1125 10/10/14 0605 10/11/14 0510  NA 133* 132* 135 131* 133*  K 5.3* 4.3 4.3 4.8 4.7  CL 102 100 102 99 99  CO2 25 27 22 28 31   GLUCOSE 195* 235* 75 121* 266*  BUN 11 8 8 8 12   CREATININE 0.62 0.60 0.54 0.67 0.78  CALCIUM 9.5 9.1 9.4 9.2 9.5   Liver Function Tests:  Recent Labs Lab 10/05/14 0500  AST 12  ALT 8  ALKPHOS 87  BILITOT 0.2*  PROT 6.8  ALBUMIN 2.1*   CBC:  Recent Labs Lab 10/05/14 0500 10/06/14 0513 10/07/14 0545 10/08/14 0515 10/11/14 0510  WBC 12.5* 14.5* 12.0* 11.6* 16.6*  HGB 8.9* 8.4* 8.5* 8.0* 8.2*  HCT 28.0* 26.7* 27.0* 25.2* 26.5*  MCV 81.9 82.4 84.6 85.4 83.3  PLT 718* 690* 712* 716* 651*   BNP (last 3 results)  Recent Labs  01/29/14 2113  PROBNP 1343.0*   CBG:  Recent Labs Lab 10/10/14 1151 10/10/14 1719 10/10/14 2153 10/11/14 0757 10/11/14 1144  GLUCAP 88 78 262* 269* 132*   Surgical Pathology 10/04/2014 1.  Debridement, Sacral decubitus - BENIGN SKIN AND  SUBCUTANEOUS SOFT TISSUE WITH FIBROSIS, NECROSIS, AND ACUTE INFLAMMATION. - NEGATIVE FOR MALIGNANCY. 2. Debridement, Right ischium - BENIGN SKIN AND SUBCUTANEOUS SOFT TISSUE WITH NECROSIS, FIBROSIS, ACUTE AND CHRONIC INFLAMMATION AND ABSCESS FORMATION. - NEGATIVE FOR MALIGNANCY.  Recent Results (from the past 240 hour(s))  MRSA PCR Screening     Status: None   Collection Time: 10/04/14  3:22 AM  Result Value Ref Range Status   MRSA by PCR NEGATIVE NEGATIVE Final    Comment:        The GeneXpert MRSA Assay (FDA approved for NASAL  specimens only), is one component of a comprehensive MRSA colonization surveillance program. It is not intended to diagnose MRSA infection nor to guide or monitor treatment for MRSA infections.   Tissue culture     Status: None   Collection Time: 10/04/14  1:46 PM  Result Value Ref Range Status   Specimen Description TISSUE SACRAL  Final   Special Requests NONE  Final   Gram Stain   Final    NO WBC SEEN NO ORGANISMS SEEN Performed at [facility]    Culture   Final    MULTIPLE ORGANISMS PRESENT, NONE PREDOMINANT Note: NO STAPHYLOCOCCUS AUREUS ISOLATED NO GROUP A STREP (S.PYOGENES) ISOLATED Performed at [facility]    Report Status 10/07/2014 FINAL  Final  Anaerobic culture     Status: None   Collection Time: 10/04/14  1:46 PM  Result Value Ref Range Status   Specimen Description TISSUE SACRAL  Final   Special Requests NONE  Final   Gram Stain   Final    NO WBC SEEN NO ORGANISMS SEEN Performed at [facility]    Culture   Final    NO ANAEROBES ISOLATED Performed at [facility]    Report Status 10/10/2014 FINAL  Final  Clostridium Difficile by PCR     Status: None   Collection Time: 10/06/14  2:58 PM  Result Value Ref Range Status   C difficile by pcr NEGATIVE NEGATIVE Final    Studies: Dg Chest Port 1 View  10/10/2014   CLINICAL DATA:  Pt has fever, chills, and altered mental status. Hx asthma and pneumonia  EXAM: PORTABLE CHEST - 1 VIEW  COMPARISON:  09/29/2014  FINDINGS: Right arm PICC has been placed to the cavoatrial junction. Patchy infiltrate or atelectasis in the inferior lingula, obscuring a portion of the left heart border. Right lung clear. No effusion. Old healed right clavicle fracture.  IMPRESSION: 1. PICC line to the cavoatrial junction. 2. New lingular infiltrate or atelectasis.   Electronically Signed   By: Arne Cleveland M.D.   On: 10/10/2014 12:26   Scheduled Meds: . antiseptic oral rinse  7 mL Mouth Rinse BID  .  atorvastatin  20 mg Oral Daily  . bacitracin   Topical BID  . collagenase   Topical Daily  . feeding supplement (GLUCERNA SHAKE)  237 mL Oral TID BM  . heparin  5,000 Units Subcutaneous 3 times per day  . [START ON 10/13/2014] heparin  5,000 Units Subcutaneous 3 times per day  . insulin aspart  0-15 Units Subcutaneous TID WC  . insulin aspart  0-5 Units Subcutaneous QHS  . insulin aspart  3 Units Subcutaneous TID WC  . insulin glargine  26 Units Subcutaneous QHS  . levofloxacin  500 mg Oral Daily  . loratadine  10 mg Oral Daily  . metoCLOPramide  5 mg Oral Once per day on Mon Thu  . mupirocin cream  Topical BID  . pantoprazole  40 mg Oral Daily  . polyethylene glycol  17 g Oral Daily  . pregabalin  100 mg Oral QID  . sodium chloride  3 mL Intravenous Q12H  . vancomycin  500 mg Intravenous Q12H   Continuous Infusions:   Principal Problem:   Osteomyelitis, pelvis Active Problems:   GERD   Protein-calorie malnutrition, severe   IDDM (insulin dependent diabetes mellitus)   Spinal cord infarction (history of)   Sepsis   UTI (lower urinary tract infection)   Cellulitis and abscess of leg, except foot   Abscess   Staphylococcus aureus bacteremia with sepsis   AKI (acute kidney injury)  Imogene Burn, PA-C [facility] [phone]  If 7PM-7AM, please contact night-coverage [email or web address] Password [email or web address] 10/11/2014, 2:04 PM    LOS: 12 days   Patient was seen, examined,treatment plan was discussed with the Advance Practice Provider.  I have directly reviewed the clinical findings, lab, imaging studies and management of this patient in detail. I have made the necessary changes to the above noted documentation, and agree with the documentation, as recorded by the Advance Practice Provider.   Marzetta Board, MD [facility] [phone]

## 2014-10-11 NOTE — Progress Notes (Signed)
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 Infectious disease consulting.  s/p Procedure(s) on 10/04/14: DEBRIDMENT OF DECUBITUS ULCER (N/A) Iscial decubitus - to OR for debridement and CX.  Pt continues to eat well; PO: 100% of meals. Pt also receives Glucerna TID, however, refused today due to decreased appetite.  Pt continues with multiple wounds and is followed closely by [facility] RN and PT. Hydrotherapy was d/c today. Pt continues on wound vac.  Pt is preparing for diverting loop colostomy procedure to assist with wound healing.  Labs reviewed. Na: 133, Glucose: 266, CBGS: 132-269.   Height: Ht Readings from Last 1 Encounters:  10/01/14 5' 8"  (1.727 m)    Weight Status:   Wt Readings from Last 1 Encounters:  10/02/14 125 lb 10.6 oz (57 kg)    Re-estimated needs:  Kcal: 2100-2300 Protein: 104-114 grams Fluid: 2.1-2.3 L  Skin: open wound on lt toe, eschar wound on left middle finger, 2 stage III pressure ulcer on sacrum, stage IV pressure ulcer on ischium  Diet Order: Diet regular Diet NPO time specified   Intake/Output Summary (Last 24 hours) at 10/11/14 1437 Last data filed at 10/11/14 1017  Gross per 24 hour  Intake    580 ml  Output   4200 ml  Net  -3620 ml    Last BM:  10/11/14   Labs:   Recent Labs Lab 10/09/14 1125 10/10/14 0605 10/11/14 0510  NA 135 131* 133*  K 4.3 4.8 4.7  CL 102 99 99  CO2 22 28 31   BUN 8 8 12   CREATININE 0.54 0.67 0.78  CALCIUM 9.4 9.2 9.5  GLUCOSE 75 121* 266*    CBG (last 3)   Recent Labs  10/10/14 2153 10/11/14 0757 10/11/14 1144  GLUCAP 262* 269* 132*    Scheduled Meds: . antiseptic oral rinse  7 mL Mouth Rinse BID  . atorvastatin  20 mg Oral Daily  . bacitracin   Topical BID  . collagenase   Topical Daily  . feeding supplement (GLUCERNA SHAKE)  237 mL Oral TID BM  . heparin  5,000 Units Subcutaneous 3 times per day  . [START ON 10/13/2014] heparin  5,000 Units Subcutaneous 3 times per day  . insulin aspart  0-15 Units Subcutaneous TID WC  . insulin aspart  0-5 Units Subcutaneous QHS  . insulin aspart  3 Units Subcutaneous TID WC  . insulin glargine  26 Units Subcutaneous QHS  . levofloxacin  500 mg Oral Daily  . loratadine  10 mg Oral Daily  . metoCLOPramide  5 mg Oral Once per day on Mon Thu  . mupirocin cream   Topical BID  . pantoprazole  40 mg Oral Daily  . polyethylene glycol  17 g Oral Daily  . pregabalin  100 mg Oral QID  . sodium chloride  3 mL Intravenous Q12H  .  vancomycin  500 mg Intravenous Q12H    Continuous Infusions:   Othon Guardia [name], RD, LDN, CDE Pager: [phone] After hours Pager: [phone]

## 2014-10-11 NOTE — Progress Notes (Signed)
Patient ID: Jason [name], male   DOB: July 23, 1986, 29 y.o.   MRN: [id number]  I met with the patient and his mother.  We discussed the possibility of diverting loop colostomy.  He is agreeable to surgery.  Plan laparoscopic-assisted loop colostomy tomorrow.  The surgical procedure has been discussed with the patient.  Potential risks, benefits, alternative treatments, and expected outcomes have been explained.  All of the patient's questions at this time have been answered.  The likelihood of reaching the patient's treatment goal is good.  The patient understand the proposed surgical procedure and wishes to proceed.  NPO p MN Hold anticoagulation after midnight IV fluids beginning tonight.  Imogene Burn. Georgette Dover, MD, [facility]  General/ Trauma Surgery  10/11/2014 8:59 AM

## 2014-10-11 NOTE — Progress Notes (Signed)
7 Days Post-Op  Subjective: Pt with no acute events.  Objective: Vital signs in last 24 hours: Temp:  [98.5 F (36.9 C)-99.8 F (37.7 C)] 99.8 F (37.7 C) (01/04 0519) Pulse Rate:  [87-94] 88 (01/04 0519) Resp:  [16-18] 18 (01/04 0519) BP: (109-135)/(73-83) 117/76 mmHg (01/04 0519) SpO2:  [91 %-100 %] 97 % (01/04 0519) Last BM Date: 10/10/14  Intake/Output from previous day: 01/03 0701 - 01/04 0700 In: 600 [P.O.:600] Out: 4700 [Urine:4700] Intake/Output this shift:   Gen: Alert, NAD, pleasant Skin: dressing in place Abd: Suprapubic tube in place  Lab Results:   Recent Labs  10/11/14 0510  WBC 16.6*  HGB 8.2*  HCT 26.5*  PLT 651*   BMET  Recent Labs  10/10/14 0605 10/11/14 0510  NA 131* 133*  K 4.8 4.7  CL 99 99  CO2 28 31  GLUCOSE 121* 266*  BUN 8 12  CREATININE 0.67 0.78  CALCIUM 9.2 9.5    Studies/Results: Dg Chest Port 1 View  10/10/2014   CLINICAL DATA:  Pt has fever, chills, and altered mental status. Hx asthma and pneumonia  EXAM: PORTABLE CHEST - 1 VIEW  COMPARISON:  09/29/2014  FINDINGS: Right arm PICC has been placed to the cavoatrial junction. Patchy infiltrate or atelectasis in the inferior lingula, obscuring a portion of the left heart border. Right lung clear. No effusion. Old healed right clavicle fracture.  IMPRESSION: 1. PICC line to the cavoatrial junction. 2. New lingular infiltrate or atelectasis.   Electronically Signed   By: Arne Cleveland M.D.   On: 10/10/2014 12:26    Anti-infectives: Anti-infectives    Start     Dose/Rate Route Frequency Ordered Stop   10/10/14 1930  levofloxacin (LEVAQUIN) tablet 500 mg     500 mg Oral Daily 10/10/14 1821     10/08/14 1000  vancomycin (VANCOCIN) 500 mg in sodium chloride 0.9 % 100 mL IVPB     500 mg100 mL/hr over 60 Minutes Intravenous Every 12 hours 10/07/14 2225     09/30/14 1400  vancomycin (VANCOCIN) IVPB 1000 mg/200 mL premix  Status:  Discontinued     1,000 mg200 mL/hr over 60 Minutes  Intravenous Every 24 hours 09/29/14 1326 09/29/14 1848   09/30/14 1400  vancomycin (VANCOCIN) IVPB 1000 mg/200 mL premix  Status:  Discontinued     1,000 mg200 mL/hr over 60 Minutes Intravenous Every 24 hours 09/29/14 1916 09/30/14 0845   09/30/14 1000  vancomycin (VANCOCIN) IVPB 750 mg/150 ml premix  Status:  Discontinued     750 mg150 mL/hr over 60 Minutes Intravenous Every 12 hours 09/30/14 0845 10/07/14 2225   09/29/14 2200  aztreonam (AZACTAM) 1 g in dextrose 5 % 50 mL IVPB  Status:  Discontinued     1 g100 mL/hr over 30 Minutes Intravenous 3 times per day 09/29/14 1326 09/29/14 1848   09/29/14 2200  aztreonam (AZACTAM) 1 g in dextrose 5 % 50 mL IVPB  Status:  Discontinued     1 g100 mL/hr over 30 Minutes Intravenous 3 times per day 09/29/14 1916 10/01/14 1426   09/29/14 1300  aztreonam (AZACTAM) 2 g in dextrose 5 % 50 mL IVPB     2 g100 mL/hr over 30 Minutes Intravenous STAT 09/29/14 1239 09/29/14 1355   09/29/14 1245  vancomycin (VANCOCIN) IVPB 1000 mg/200 mL premix     1,000 mg200 mL/hr over 60 Minutes Intravenous  Once 09/29/14 1239 09/29/14 1530      Assessment/Plan: Ischial and sacral decubitus ulcers  s/p debridement of decubitus ulcer and bone biopsy right ischium Staphococcus aureus bacteremia Leukocytosis   Con't current abx as per primary. Cdiff neg Pt is unable to keep vac in place 2/2 to BMs Pt will likely need a diverting ostomy to assist with healing of ulcers.  Will d/w Dr. Georgette Dover    LOS: 12 days    Rosario Jacks., [facility] 10/11/2014

## 2014-10-12 ENCOUNTER — Inpatient Hospital Stay (HOSPITAL_COMMUNITY): Payer: Medicaid Other | Admitting: Certified Registered"

## 2014-10-12 ENCOUNTER — Encounter (HOSPITAL_COMMUNITY): Payer: Self-pay | Admitting: Certified Registered"

## 2014-10-12 ENCOUNTER — Encounter (HOSPITAL_COMMUNITY)
Admission: EM | Disposition: A | Discharge status: Home Health | Payer: Self-pay | Source: Home / Self Care | Attending: Internal Medicine

## 2014-10-12 HISTORY — PX: INSERTION OF SUPRAPUBIC CATHETER: SHX5870

## 2014-10-12 HISTORY — PX: LAPAROSCOPIC DIVERTED COLOSTOMY: SHX5892

## 2014-10-12 LAB — BASIC METABOLIC PANEL
Anion gap: 7 (ref 5–15)
BUN: 9 mg/dL (ref 6–23)
CO2: 30 mmol/L (ref 19–32)
Calcium: 10 mg/dL (ref 8.4–10.5)
Chloride: 98 mEq/L (ref 96–112)
Creatinine, Ser: 0.69 mg/dL (ref 0.50–1.35)
Glucose, Bld: 81 mg/dL (ref 70–99)
POTASSIUM: 4 mmol/L (ref 3.5–5.1)
Sodium: 135 mmol/L (ref 135–145)

## 2014-10-12 LAB — URINE CULTURE
Colony Count: NO GROWTH
Culture: NO GROWTH

## 2014-10-12 LAB — CBC
HCT: 26.3 % — ABNORMAL LOW (ref 39.0–52.0)
Hemoglobin: 8.3 g/dL — ABNORMAL LOW (ref 13.0–17.0)
MCH: 26.8 pg (ref 26.0–34.0)
MCHC: 31.6 g/dL (ref 30.0–36.0)
MCV: 84.8 fL (ref 78.0–100.0)
PLATELETS: 642 10*3/uL — AB (ref 150–400)
RBC: 3.1 MIL/uL — ABNORMAL LOW (ref 4.22–5.81)
RDW: 15.9 % — ABNORMAL HIGH (ref 11.5–15.5)
WBC: 13.7 10*3/uL — ABNORMAL HIGH (ref 4.0–10.5)

## 2014-10-12 LAB — SURGICAL PCR SCREEN
MRSA, PCR: NEGATIVE
STAPHYLOCOCCUS AUREUS: NEGATIVE

## 2014-10-12 LAB — GLUCOSE, CAPILLARY
GLUCOSE-CAPILLARY: 85 mg/dL (ref 70–99)
Glucose-Capillary: 88 mg/dL (ref 70–99)
Glucose-Capillary: 73 mg/dL (ref 70–99)
Glucose-Capillary: 251 mg/dL — ABNORMAL HIGH (ref 70–99)
Glucose-Capillary: 305 mg/dL — ABNORMAL HIGH (ref 70–99)

## 2014-10-12 SURGERY — CREATION, COLOSTOMY, DIVERTING, LAPAROSCOPIC
Anesthesia: General | Site: Abdomen

## 2014-10-12 MED ORDER — SODIUM CHLORIDE 0.9 % IR SOLN
Status: DC | PRN
  Administered 2014-10-12: 1000 mL

## 2014-10-12 MED ORDER — ALBUMIN HUMAN 5 % IV SOLN
INTRAVENOUS | Status: DC | PRN
  Administered 2014-10-12: 12:00:00 via INTRAVENOUS

## 2014-10-12 MED ORDER — LIDOCAINE HCL (CARDIAC) 20 MG/ML IV SOLN
INTRAVENOUS | Status: AC
  Filled 2014-10-12: qty 5

## 2014-10-12 MED ORDER — FENTANYL CITRATE 0.05 MG/ML IJ SOLN
INTRAMUSCULAR | Status: DC | PRN
  Administered 2014-10-12: 50 ug via INTRAVENOUS
  Administered 2014-10-12: 100 ug via INTRAVENOUS

## 2014-10-12 MED ORDER — GLYCOPYRROLATE 0.2 MG/ML IJ SOLN
INTRAMUSCULAR | Status: DC | PRN
  Administered 2014-10-12: 0.4 mg via INTRAVENOUS

## 2014-10-12 MED ORDER — BUPIVACAINE-EPINEPHRINE 0.25% -1:200000 IJ SOLN
INTRAMUSCULAR | Status: DC | PRN
  Administered 2014-10-12: 3 mL

## 2014-10-12 MED ORDER — BUPIVACAINE-EPINEPHRINE (PF) 0.25% -1:200000 IJ SOLN
INTRAMUSCULAR | Status: AC
  Filled 2014-10-12: qty 30

## 2014-10-12 MED ORDER — PHENYLEPHRINE HCL 10 MG/ML IJ SOLN
INTRAMUSCULAR | Status: DC | PRN
  Administered 2014-10-12 (×2): 80 ug via INTRAVENOUS

## 2014-10-12 MED ORDER — CIPROFLOXACIN IN D5W 400 MG/200ML IV SOLN
400.0000 mg | INTRAVENOUS | Status: AC
  Administered 2014-10-12: 400 mg via INTRAVENOUS
  Filled 2014-10-12: qty 200

## 2014-10-12 MED ORDER — STERILE WATER FOR IRRIGATION IR SOLN
Status: DC | PRN
  Administered 2014-10-12: 1000 mL via INTRAVESICAL
  Administered 2014-10-12: 3000 mL

## 2014-10-12 MED ORDER — HYDROMORPHONE HCL 1 MG/ML IJ SOLN: 0.2500 mg | INTRAMUSCULAR | Status: DC | PRN

## 2014-10-12 MED ORDER — LACTATED RINGERS IV SOLN
INTRAVENOUS | Status: DC
  Administered 2014-10-12 – 2014-10-14 (×4): via INTRAVENOUS

## 2014-10-12 MED ORDER — NEOSTIGMINE METHYLSULFATE 10 MG/10ML IV SOLN
INTRAVENOUS | Status: DC | PRN
  Administered 2014-10-12: 3 mg via INTRAVENOUS

## 2014-10-12 MED ORDER — HEPARIN SODIUM (PORCINE) 5000 UNIT/ML IJ SOLN
5000.0000 [IU] | Freq: Three times a day (TID) | INTRAMUSCULAR | Status: DC
  Administered 2014-10-13 – 2014-10-14 (×4): 5000 [IU] via SUBCUTANEOUS
  Filled 2014-10-12 (×6): qty 1

## 2014-10-12 MED ORDER — ONDANSETRON HCL 4 MG/2ML IJ SOLN
INTRAMUSCULAR | Status: DC | PRN
  Administered 2014-10-12: 4 mg via INTRAVENOUS

## 2014-10-12 MED ORDER — MIDAZOLAM HCL 2 MG/2ML IJ SOLN
INTRAMUSCULAR | Status: AC
  Filled 2014-10-12: qty 2

## 2014-10-12 MED ORDER — FENTANYL CITRATE 0.05 MG/ML IJ SOLN
INTRAMUSCULAR | Status: AC
  Filled 2014-10-12: qty 5

## 2014-10-12 MED ORDER — ROCURONIUM BROMIDE 50 MG/5ML IV SOLN
INTRAVENOUS | Status: AC
  Filled 2014-10-12: qty 1

## 2014-10-12 MED ORDER — ROCURONIUM BROMIDE 100 MG/10ML IV SOLN
INTRAVENOUS | Status: DC | PRN
Start: 2014-10-12 — End: 2014-10-12
  Administered 2014-10-12: 35 mg via INTRAVENOUS
  Administered 2014-10-12: 10 mg via INTRAVENOUS

## 2014-10-12 MED ORDER — LIDOCAINE HCL (CARDIAC) 20 MG/ML IV SOLN
INTRAVENOUS | Status: DC | PRN
  Administered 2014-10-12: 90 mg via INTRAVENOUS

## 2014-10-12 MED ORDER — DEXAMETHASONE SODIUM PHOSPHATE 4 MG/ML IJ SOLN
INTRAMUSCULAR | Status: DC | PRN
  Administered 2014-10-12: 4 mg via INTRAVENOUS

## 2014-10-12 MED ORDER — PROPOFOL 10 MG/ML IV BOLUS
INTRAVENOUS | Status: DC | PRN
  Administered 2014-10-12: 110 mg via INTRAVENOUS

## 2014-10-12 MED ORDER — PROMETHAZINE HCL 25 MG/ML IJ SOLN: 6.2500 mg | INTRAMUSCULAR | Status: DC | PRN

## 2014-10-12 MED ORDER — PROPOFOL 10 MG/ML IV BOLUS
INTRAVENOUS | Status: AC
  Filled 2014-10-12: qty 20

## 2014-10-12 MED ORDER — STERILE WATER FOR IRRIGATION IR SOLN
Status: DC | PRN
  Administered 2014-10-12: 1000 mL

## 2014-10-12 SURGICAL SUPPLY — 80 items
APPLIER CLIP 5 13 M/L LIGAMAX5 (MISCELLANEOUS)
APPLIER CLIP ROT 10 11.4 M/L (STAPLE)
BAG URIMETER 350ML BARDEX IC (UROLOGICAL SUPPLIES) ×1
BAG URIMETER BARDEX IC 350 (UROLOGICAL SUPPLIES) ×2 IMPLANT
BAG URO CATCHER STRL LF (DRAPE) ×3 IMPLANT
BLADE SURG ROTATE 9660 (MISCELLANEOUS) ×3 IMPLANT
CANISTER SUCTION 2500CC (MISCELLANEOUS) ×3 IMPLANT
CATH FOLEY 2WAY SLVR 18FR 30CC (CATHETERS) ×3 IMPLANT
CATH FOLEY INTRO SUPRA 16F (CATHETERS) ×3 IMPLANT
CELLS DAT CNTRL 66122 CELL SVR (MISCELLANEOUS) IMPLANT
CHLORAPREP W/TINT 26ML (MISCELLANEOUS) ×3 IMPLANT
CLIP APPLIE 5 13 M/L LIGAMAX5 (MISCELLANEOUS) IMPLANT
CLIP APPLIE ROT 10 11.4 M/L (STAPLE) IMPLANT
COVER MAYO STAND STRL (DRAPES) IMPLANT
COVER SURGICAL LIGHT HANDLE (MISCELLANEOUS) ×6 IMPLANT
DRAPE LAPAROSCOPIC ABDOMINAL (DRAPES) ×3 IMPLANT
DRAPE PROXIMA HALF (DRAPES) IMPLANT
DRAPE UTILITY XL STRL (DRAPES) IMPLANT
DRSG OPSITE POSTOP 4X10 (GAUZE/BANDAGES/DRESSINGS) IMPLANT
DRSG OPSITE POSTOP 4X8 (GAUZE/BANDAGES/DRESSINGS) IMPLANT
ELECT BLADE 6.5 EXT (BLADE) ×3 IMPLANT
ELECT CAUTERY BLADE 6.4 (BLADE) ×6 IMPLANT
ELECT REM PT RETURN 9FT ADLT (ELECTROSURGICAL) ×3
ELECTRODE REM PT RTRN 9FT ADLT (ELECTROSURGICAL) ×1 IMPLANT
GEL ULTRASOUND 20GR AQUASONIC (MISCELLANEOUS) IMPLANT
GLOVE BIO SURGEON STRL SZ 6.5 (GLOVE) ×2 IMPLANT
GLOVE BIO SURGEON STRL SZ7 (GLOVE) ×12 IMPLANT
GLOVE BIO SURGEON STRL SZ7.5 (GLOVE) ×9 IMPLANT
GLOVE BIO SURGEONS STRL SZ 6.5 (GLOVE) ×1
GLOVE BIOGEL PI IND STRL 7.0 (GLOVE) ×3 IMPLANT
GLOVE BIOGEL PI IND STRL 7.5 (GLOVE) ×3 IMPLANT
GLOVE BIOGEL PI INDICATOR 7.0 (GLOVE) ×6
GLOVE BIOGEL PI INDICATOR 7.5 (GLOVE) ×6
GOWN STRL REUS W/ TWL LRG LVL3 (GOWN DISPOSABLE) ×7 IMPLANT
GOWN STRL REUS W/ TWL XL LVL3 (GOWN DISPOSABLE) ×1 IMPLANT
GOWN STRL REUS W/TWL LRG LVL3 (GOWN DISPOSABLE) ×14
GOWN STRL REUS W/TWL XL LVL3 (GOWN DISPOSABLE) ×2
KIT BASIN OR (CUSTOM PROCEDURE TRAY) ×3 IMPLANT
KIT ROOM TURNOVER OR (KITS) ×3 IMPLANT
LEGGING LITHOTOMY PAIR STRL (DRAPES) IMPLANT
LIGASURE 5MM LAPAROSCOPIC (INSTRUMENTS) IMPLANT
LIGASURE IMPACT 36 18CM CVD LR (INSTRUMENTS) IMPLANT
LIQUID BAND (GAUZE/BANDAGES/DRESSINGS) ×3 IMPLANT
NS IRRIG 1000ML POUR BTL (IV SOLUTION) ×3 IMPLANT
PAD ARMBOARD 7.5X6 YLW CONV (MISCELLANEOUS) ×6 IMPLANT
PENCIL BUTTON HOLSTER BLD 10FT (ELECTRODE) ×3 IMPLANT
RTRCTR WOUND ALEXIS 18CM MED (MISCELLANEOUS)
SCALPEL HARMONIC ACE (MISCELLANEOUS) ×3 IMPLANT
SCISSORS LAP 5X35 DISP (ENDOMECHANICALS) ×3 IMPLANT
SET IRRIG TUBING LAPAROSCOPIC (IRRIGATION / IRRIGATOR) ×3 IMPLANT
SLEEVE ENDOPATH XCEL 5M (ENDOMECHANICALS) ×6 IMPLANT
SPECIMEN JAR LARGE (MISCELLANEOUS) IMPLANT
STAPLER VISISTAT 35W (STAPLE) IMPLANT
SURGILUBE 2OZ TUBE FLIPTOP (MISCELLANEOUS) IMPLANT
SUT ETHILON 2 0 FS 18 (SUTURE) ×3 IMPLANT
SUT MNCRL AB 4-0 PS2 18 (SUTURE) ×3 IMPLANT
SUT PROLENE 2 0 CT2 30 (SUTURE) IMPLANT
SUT PROLENE 2 0 KS (SUTURE) IMPLANT
SUT SILK 0 FSL (SUTURE) ×3 IMPLANT
SUT SILK 2 0 SH (SUTURE) ×3 IMPLANT
SUT SILK 2 0 SH CR/8 (SUTURE) ×3 IMPLANT
SUT SILK 2 0 TIES 10X30 (SUTURE) ×6 IMPLANT
SUT SILK 3 0 SH CR/8 (SUTURE) ×3 IMPLANT
SUT SILK 3 0 TIES 10X30 (SUTURE) ×3 IMPLANT
SUT VIC AB 3-0 SH 18 (SUTURE) ×3 IMPLANT
SYR BULB IRRIGATION 50ML (SYRINGE) ×3 IMPLANT
SYS LAPSCP GELPORT 120MM (MISCELLANEOUS)
SYSTEM LAPSCP GELPORT 120MM (MISCELLANEOUS) IMPLANT
TOWEL OR 17X26 10 PK STRL BLUE (TOWEL DISPOSABLE) ×6 IMPLANT
TRAY FOLEY CATH 16FRSI W/METER (SET/KITS/TRAYS/PACK) IMPLANT
TRAY LAPAROSCOPIC (CUSTOM PROCEDURE TRAY) ×3 IMPLANT
TRAY PROCTOSCOPIC FIBER OPTIC (SET/KITS/TRAYS/PACK) IMPLANT
TROCAR XCEL 12X100 BLDLESS (ENDOMECHANICALS) IMPLANT
TROCAR XCEL BLUNT TIP 100MML (ENDOMECHANICALS) ×3 IMPLANT
TROCAR XCEL NON-BLD 11X100MML (ENDOMECHANICALS) IMPLANT
TROCAR XCEL NON-BLD 5MMX100MML (ENDOMECHANICALS) ×3 IMPLANT
TUBE CONNECTING 12'X1/4 (SUCTIONS) ×2
TUBE CONNECTING 12X1/4 (SUCTIONS) ×4 IMPLANT
TUBING INSUFFLATION (TUBING) ×3 IMPLANT
YANKAUER SUCT BULB TIP NO VENT (SUCTIONS) ×6 IMPLANT

## 2014-10-12 NOTE — Transfer of Care (Signed)
Immediate Anesthesia Transfer of Care Note  Patient: Jason [name]  Procedure(s) Performed: Procedure(s): LAPAROSCOPIC DIVERTING COLOSTOMY (N/A) INSERTION OF SUPRAPUBIC CATHETER (N/A)  Patient Location: PACU  Anesthesia Type:General  Level of Consciousness: awake  Airway & Oxygen Therapy: Patient Spontanous Breathing and Patient connected to nasal cannula oxygen  Post-op Assessment: Report given to PACU RN, Post -op Vital signs reviewed and stable and Patient moving all extremities  Post vital signs: Reviewed and stable  Complications: No apparent anesthesia complications

## 2014-10-12 NOTE — Anesthesia Preprocedure Evaluation (Addendum)
Anesthesia Evaluation  Patient identified by MRN, date of birth, ID band Patient awake  General Assessment Comment:Recent sepsis, resp failure, decubitus ulcer. Paraplegia  Reviewed: Allergy & Precautions, H&P , NPO status , Patient's Chart, lab work & pertinent test results, reviewed documented beta blocker date and time   Airway Mallampati: II  TM Distance: >3 FB Neck ROM: Limited    Dental  (+) Missing, Dental Advisory Given, Chipped, Poor Dentition   Pulmonary asthma , Current Smoker,  breath sounds clear to auscultation        Cardiovascular Rhythm:Regular  ECHO 08/2014 EF 55% no vegetations   Neuro/Psych Seizures -, Well Controlled,  CVA    GI/Hepatic Neg liver ROS, GERD-  Medicated,  Endo/Other  diabetes, Poorly Controlled, Insulin Dependent  Renal/GU GFR 90     Musculoskeletal  (+) Arthritis -,   Abdominal   Peds  Hematology  (+) anemia , 9/28 H/H   Anesthesia Other Findings   Reproductive/Obstetrics                            Anesthesia Physical Anesthesia Plan  ASA: IV  Anesthesia Plan: General   Post-op Pain Management:    Induction: Intravenous  Airway Management Planned: Oral ETT  Additional Equipment:   Intra-op Plan:   Post-operative Plan: Extubation in OR  Informed Consent: I have reviewed the patients History and Physical, chart, labs and discussed the procedure including the risks, benefits and alternatives for the proposed anesthesia with the patient or authorized representative who has indicated his/her understanding and acceptance.     Plan Discussed with: CRNA and Surgeon  Anesthesia Plan Comments:        Anesthesia Quick Evaluation

## 2014-10-12 NOTE — Anesthesia Postprocedure Evaluation (Signed)
  Anesthesia Post-op Note  Patient: Jason [name]  Procedure(s) Performed: Procedure(s): LAPAROSCOPIC DIVERTING COLOSTOMY (N/A) INSERTION OF SUPRAPUBIC CATHETER (N/A)  Patient Location: PACU  Anesthesia Type:General  Level of Consciousness: awake and alert   Airway and Oxygen Therapy: Patient Spontanous Breathing  Post-op Pain: mild  Post-op Assessment: Post-op Vital signs reviewed  Post-op Vital Signs: stable  Last Vitals:  Filed Vitals:   10/12/14 1415  BP: 101/60  Pulse: 74  Temp: 36.7 C  Resp: 18    Complications: No apparent anesthesia complications

## 2014-10-12 NOTE — Anesthesia Procedure Notes (Signed)
Procedure Name: Intubation Date/Time: 10/12/2014 11:44 AM Performed by: Melina Copa, Cyler Kappes R Pre-anesthesia Checklist: Patient identified, Emergency Drugs available, Suction available, Patient being monitored and Timeout performed Patient Re-evaluated:Patient Re-evaluated prior to inductionOxygen Delivery Method: Circle system utilized Preoxygenation: Pre-oxygenation with 100% oxygen Intubation Type: IV induction Ventilation: Mask ventilation without difficulty Laryngoscope Size: Mac and 4 Tube type: Oral Tube size: 7.5 mm Number of attempts: 1 Airway Equipment and Method: Stylet Placement Confirmation: ETT inserted through vocal cords under direct vision and positive ETCO2 Secured at: 22 cm Tube secured with: Tape Dental Injury: Teeth and Oropharynx as per pre-operative assessment

## 2014-10-12 NOTE — Plan of Care (Signed)
Problem: Phase III Progression Outcomes Goal: Foley discontinued Outcome: Not Met (add Reason) Patient has chronic foley

## 2014-10-12 NOTE — Progress Notes (Signed)
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  Now 2 weeks off vancomycin, chronic indwelling Foley catheter, not on home oxygen, presented to ED with altered mental status/somnolence, leukocytosis, fever and chills. Infectious disease consulting. Patient sepsis attributed to right ischial tuberosity decubitus ulcer. Surgery consulted and patient underwent debridement and bone biopsy for osteomyelitis on 12/28. PICC line placed. ID recommending 6-8 weeks of IV vancomycin. General surgery and Urology are involved in patient's dare and to help with his ulcers patient will undergo a diverting colostomy and suprapubic catheter on 10/12/2013.  Subjective: - no complaints this morning, awaiting surgery  Assessment/Plan:  Sepsis/fever:  Likely from infected right ischial tuberosity decubitus ulcer with underlying osteomyelitis. Foley catheter changed this admission. Blood cultures 2 negative to date. Persistent left lower lobe consolidation on chest x-ray but no respiratory symptoms. Recently treated for MSSA bacteremia. Recent TEE was negative. Wound care consultation appreciated. Continue IV vancomycin. Aztreonam discontinued by ID on 12/26.   - His fever curve has improved, however over the weekend has been trending up, chest xray with infiltrate vs atelectasis.  Levaquin was started on 1/3.  U/A mildly positive.  Urine culture ordered.  Sacral decubitus ulcer/pelvic OM:  MRI shows osteomyelitis of right inferior pubic ramus, but no osteo in smaller left distal tuberosity.  Status post right decubitus ulcer debridement 12/28. Patient has multiple sacral/buttock stage III ulcers, right ischial stage IV ulcer, left foot with several full-thickness  wounds, fifth toe and fourth toe wound. Management per wound care team. WOC has recommended hydrotherapy and wound vacs as appropriate.  Surgery planning diverting ostomy for 1/5.  Infectious disease recommends 4-6 weeks of IV vancomycin.  PICC line in place.  Pathology results are below.  Chronic Foley catheter and traumatic hypospadias: Patient states that urology had consulted in the past and recommended suprapubic catheter but he was not medically stable to get it at that time. Discussed with urologist on call Dr. Era Bumpers on 12/24 would recommended looping the Foley catheter and taping it to abdomen to reduce gravity related further trauma to penis and decreasing the chance of urethral stricture. He also recommended Neosporin ointment or the junction of the Foley catheter in the penis and outpatient follow-up with urology to evaluate whether he would benefit from a suprapubic catheter. As per mother, follows with Dr. McDiarmid [facility]. I discussed with Dr. McDiarmid on 1/4 regarding suprapubic catheter placement in combination with diverting ostomy placement. - patient will likely get suprapubic and diverting ostomy today.   Mild hyperkalemia  Potassium trending up despite good kidney function, unclear etiology, CBGs with good control, renal function intact, ? Multiple po supplements and increased intake.  S/p Kayexelate 12/31, K better but trends up slowly.  Will continue to monitor.  Acute encephalopathy: Secondary to problem #1. Resolved. No focal deficits.  Acute kidney injury: Related to sepsis and dehydration. Resolved.  Type II DM/IDDM: Brittle with some episodes of hypoglycemia this admission.  Continue Lantus and SSI and adjust as needed. Will adjust insulins for uncontrolled DM 2. Will decrease lantus from 30 to 26 units on 12/31.  History of severe protein calorie malnutrition: Continue nutritional supplements.  History of GERD: Continue PPI.  History of spinal cord infarction and  quadriparesis: Paraplegic in lower extremities and weak in the upper extremities.  Patient has had an indwelling Foley catheter since April 2015 which will be transitioned to suprapubic catheter on 1/5.  Dehydration with hyponatremia: resolved. DC IVF  Leukocytosis: Secondary to problem #1. Follow CBCs. Improving.  Thrombocytosis:  Likely reactive to wounds and wound treatment.  History of recently treated MSSA bacteremia: Continue above antibiotics and follow blood culture results.  Hypertension: Soft blood pressures. Hold antihypertensives.  Chronic benzodiazepine use: Continue same.  Acute on chronic Anemia: Likely secondary to acute illness and dilutional. No reported bleeding. Stable.     Code Status:  Full Family Communication:  Discussed with mother Disposition Plan:  Inpatient. D/c to home with full home health services when ready.  Consultants:  Infectious disease  General surgery  Urology   Procedures:  Changed Foley catheter  Debridement of decubitus ulcer 12/28  PICC line placed 12/28  Antibiotics:  IV vancomycin and 12/23 >  IV aztreonam 12/23 > 12/26  Levofloxacin 1/3 >>  Objective: Filed Vitals:   10/11/14 0519 10/11/14 1435 10/11/14 2155 10/12/14 0530  BP: 117/76 91/63 116/74 113/72  Pulse: 88 90 90 87  Temp: 99.8 F (37.7 C) 98.4 F (36.9 C) 100 F (37.8 C) 98.8 F (37.1 C)  TempSrc: Oral Oral Oral Oral  Resp: 18 16 24 24   Height:      Weight:      SpO2: 97% 97% 100% 98%    Intake/Output Summary (Last 24 hours) at 10/12/14 0700 Last data filed at 10/12/14 0543  Gross per 24 hour  Intake    690 ml  Output   1100 ml  Net   -410 ml   Filed Weights   10/01/14 0408 10/02/14 0455 10/02/14 1841  Weight: 55.4 kg (122 lb 2.2 oz) 55.3 kg (121 lb 14.6 oz) 57 kg (125 lb 10.6 oz)   Exam: General exam:   thinly nourished chronically ill-looking young male.  Alert, orientated with clear speech. Respiratory system: Clear. No increased work of  breathing. Cardiovascular system: S1 & S2 heard, RRR. No JVD, murmurs, gallops, clicks or pedal edema. Gastrointestinal system: Abdomen is thin, nondistended, soft and nontender. Normal bowel sounds heard. Extremities:  Grade 0 x 5 power in lower extremities and grade 3 x 5 power in upper extremities proximally and 0 x 5 power distally with contractures of fingers. Skin: Decubitus/surgical site dressing clean and dry.  Data Reviewed: Basic Metabolic Panel:  Recent Labs Lab 10/07/14 0545 10/08/14 0515 10/09/14 1125 10/10/14 0605 10/11/14 0510  NA 133* 132* 135 131* 133*  K 5.3* 4.3 4.3 4.8 4.7  CL 102 100 102 99 99  CO2 25 27 22 28 31   GLUCOSE 195* 235* 75 121* 266*  BUN 11 8 8 8 12   CREATININE 0.62 0.60 0.54 0.67 0.78  CALCIUM 9.5 9.1 9.4 9.2 9.5   CBC:  Recent Labs Lab 10/06/14 0513 10/07/14 0545 10/08/14 0515 10/11/14 0510 10/12/14 0544  WBC 14.5* 12.0* 11.6* 16.6* 13.7*  HGB 8.4* 8.5* 8.0* 8.2* 8.3*  HCT 26.7* 27.0* 25.2* 26.5* 26.3*  MCV 82.4 84.6 85.4 83.3 84.8  PLT 690* 712* 716* 651* 642*   BNP (last 3 results)  Recent Labs  01/29/14 2113  PROBNP 1343.0*   CBG:  Recent Labs Lab 10/10/14 2153 10/11/14 0757 10/11/14 1144 10/11/14 1656 10/11/14 2158  GLUCAP 262* 269* 132* 92 155*   Surgical Pathology 10/04/2014 1.  Debridement, Sacral decubitus - BENIGN SKIN AND SUBCUTANEOUS SOFT TISSUE WITH FIBROSIS, NECROSIS, AND ACUTE INFLAMMATION. - NEGATIVE FOR MALIGNANCY. 2. Debridement, Right  ischium - BENIGN SKIN AND SUBCUTANEOUS SOFT TISSUE WITH NECROSIS, FIBROSIS, ACUTE AND CHRONIC INFLAMMATION AND ABSCESS FORMATION. - NEGATIVE FOR MALIGNANCY.  Recent Results (from the past 240 hour(s))  MRSA PCR Screening     Status: None   Collection Time: 10/04/14  3:22 AM  Result Value Ref Range Status   MRSA by PCR NEGATIVE NEGATIVE Final    Comment:        The GeneXpert MRSA Assay (FDA approved for NASAL specimens only), is one component of a comprehensive  MRSA colonization surveillance program. It is not intended to diagnose MRSA infection nor to guide or monitor treatment for MRSA infections.   Tissue culture     Status: None   Collection Time: 10/04/14  1:46 PM  Result Value Ref Range Status   Specimen Description TISSUE SACRAL  Final   Special Requests NONE  Final   Gram Stain   Final    NO WBC SEEN NO ORGANISMS SEEN Performed at [facility]    Culture   Final    MULTIPLE ORGANISMS PRESENT, NONE PREDOMINANT Note: NO STAPHYLOCOCCUS AUREUS ISOLATED NO GROUP A STREP (S.PYOGENES) ISOLATED Performed at [facility]    Report Status 10/07/2014 FINAL  Final  Anaerobic culture     Status: None   Collection Time: 10/04/14  1:46 PM  Result Value Ref Range Status   Specimen Description TISSUE SACRAL  Final   Special Requests NONE  Final   Gram Stain   Final    NO WBC SEEN NO ORGANISMS SEEN Performed at [facility]    Culture   Final    NO ANAEROBES ISOLATED Performed at [facility]    Report Status 10/10/2014 FINAL  Final  Clostridium Difficile by PCR     Status: None   Collection Time: 10/06/14  2:58 PM  Result Value Ref Range Status   C difficile by pcr NEGATIVE NEGATIVE Final  Surgical pcr screen     Status: None   Collection Time: 10/12/14  2:38 AM  Result Value Ref Range Status   MRSA, PCR NEGATIVE NEGATIVE Final   Staphylococcus aureus NEGATIVE NEGATIVE Final    Comment:        The Xpert SA Assay (FDA approved for NASAL specimens in patients over 20 years of age), is one component of a comprehensive surveillance program.  Test performance has been validated by [employer] for patients greater than or equal to 20 year old. It is not intended to diagnose infection nor to guide or monitor treatment.     Studies: Dg Chest Port 1 View  10/10/2014   CLINICAL DATA:  Pt has fever, chills, and altered mental status. Hx asthma and pneumonia  EXAM: PORTABLE CHEST - 1 VIEW   COMPARISON:  09/29/2014  FINDINGS: Right arm PICC has been placed to the cavoatrial junction. Patchy infiltrate or atelectasis in the inferior lingula, obscuring a portion of the left heart border. Right lung clear. No effusion. Old healed right clavicle fracture.  IMPRESSION: 1. PICC line to the cavoatrial junction. 2. New lingular infiltrate or atelectasis.   Electronically Signed   By: Arne Cleveland M.D.   On: 10/10/2014 12:26   Scheduled Meds: . antiseptic oral rinse  7 mL Mouth Rinse BID  . atorvastatin  20 mg Oral Daily  . bacitracin   Topical BID  . collagenase   Topical Daily  . feeding supplement (GLUCERNA SHAKE)  237 mL Oral TID BM  . [START ON 10/13/2014] heparin  5,000 Units Subcutaneous 3 times per day  . insulin aspart  0-15 Units Subcutaneous TID WC  . insulin aspart  0-5 Units Subcutaneous QHS  . insulin aspart  3 Units Subcutaneous TID WC  . insulin glargine  26 Units Subcutaneous QHS  . levofloxacin  500 mg Oral Daily  . loratadine  10 mg Oral Daily  . metoCLOPramide  5 mg Oral Once per day on Mon Thu  . mupirocin cream   Topical BID  . pantoprazole  40 mg Oral Daily  . polyethylene glycol  17 g Oral Daily  . pregabalin  100 mg Oral QID  . sodium chloride  3 mL Intravenous Q12H  . vancomycin  500 mg Intravenous Q12H   Continuous Infusions:   Principal Problem:   Osteomyelitis, pelvis Active Problems:   GERD   Protein-calorie malnutrition, severe   IDDM (insulin dependent diabetes mellitus)   Spinal cord infarction (history of)   Sepsis   UTI (lower urinary tract infection)   Cellulitis and abscess of leg, except foot   Abscess   Staphylococcus aureus bacteremia with sepsis   AKI (acute kidney injury)  Costin M. Cruzita Lederer, MD [facility] [phone]   If 7PM-7AM, please contact night-coverage [email or web address] Password [email or web address] 10/12/2014, 7:00 AM    LOS: 13 days

## 2014-10-12 NOTE — Op Note (Signed)
Pre-op diagnosis:  Sacral decubitus ulcer with osteomyelitis Post-op diagnosis:  Same Procedure:  Laparoscopic assisted diverting loop colostomy Surgeon:Johne Buckle K. Assistant:  [name] PA-C Anesthesia: GETT Indications:  This is a 29 year old male who is quadriplegic and has developed a severe sacral decubitus ulcer. He has exposed sacrum with osteomyelitis. The wound has been debrided but continues to get contaminated with bowel movements as well as urine. We have been asked to perform a diverting loop colostomy. Simultaneously, the urologist are performing a suprapubic tube placement. They have completed their portion of the case.  Description of procedure: The patient was under general anesthesia in a supine position. His abdomen was prepped with ChloraPrep and draped in sterile fashion. A timeout was taken to ensure the proper patient and proper procedure. A 5 mm Optiview port was used to cannulate the peritoneal cavity. This was done in the right upper quadrant. We insufflated CO2 maintaining 15 mmHg pressure. 2 additional 5 mm ports were placed on the right side of the abdomen. The sigmoid colon was identified. There were some adhesions to the lateral abdominal wall. These were taken down with the harmonic scalpel. The descending and sigmoid colon seemed fairly mobile. The patient had previously been marked by the ostomy nurse. The colon reach this area with no tension. A circle of skin was excised at the ostomy site. Dissection was carried down to the fascia. The fascia was incised vertically. We into the peritoneal cavity and released pneumoperitoneum. The laparoscope was removed as we grasped the mobile area of sigmoid colon and exteriorized through the ostomy site. A red rubber catheter was used to create a bridge underneath the loop colostomy to prevent retraction. The laparoscopic trocars were removed. The 5 mm port sites were all closed with 4-0 Monocryl. The colostomy was then opened  and matured with 3-0 Vicryl sutures. The red rubber catheter was secured with 2-0 ethilon.  An ostomy appliance was cut to fit and applied to the skin.  He was extubated and brought to the recovery room in stable condition.  Imogene Burn. Georgette Dover, MD, [facility] Surgery  General/ Trauma Surgery  10/12/2014 1:29 PM

## 2014-10-12 NOTE — Op Note (Signed)
Preoperative Diagnosis: neurogenic bladder  Postoperative Diagnosis:  same  Procedure(s) Performed:  Cystourethroscopy  Suprapubic tube placement  Teaching Surgeon:  Bjorn Loser, MD  Resident Surgeon:  Marta Antu, MD  Assistant(s):  none  Anesthesia:  General via endotracheal tube  Fluids:  See anesthesia record  Estimated blood loss:  0 mL  Specimens:  Urine for culture  Drains:  18Fr foley with 10cc water in balloon as suprapubic tube  Complications:  none  Indications: 28yM with 65month history of paraplegia and neurogenic bladder with indwelling foley comlicated by ventral glans erosion. After discussion of risks/benefits/alternative therapies, he agreed to the above procedure.  Findings:   1. Cystourethroscopy revealed a urethral erosion in the proximal bulb, likely from catheter trauma. This was not full thickness on mucosa, but will be prone to stricture. 2. Bladder was good capacity (probably >800cc). No mucosal abnormalities. No stones.  Description:  The patient was correctly identified in the preop holding area where written informed consent as well potential risk and complication reviewed. He agreed. They were brought back to the operative suite where a preinduction timeout was performed. Once correct information was verified, general anesthesia was induced via endotracheal tube. They were then gently placed into dorsal lithotomy position with SCDs in place for VTE prophylaxis. They were prepped and draped in the usual sterile fashion and given appropriate preoperative antibiotics with cipro.   We inserted a 55F rigid cystoscope per urethra with copious lubrication and normal saline irrigation running. This demonstrated findings as described above.    A 1cm incision was made in the midline in the suprapubic region two finger breadths above the pubis. Through this, and under cystoscopic vision, the Rusche suprapubic tube needle was inserted into the  bladder, through which an 18Fr foley was inserted. The balloon was inflated. The catheter was tied in place with 0-silk.  The patient's bladder was emptied and they were turned over to our General Surgery Colleagues for lap colostomy to try to improve his sacral decubitus ulcer.   Post Op Plan:   1. He will need SPT change in the office in 1 month.  Attestation:  Dr. Matilde Sprang was present and scrubbed for the entirety of the procedure.    James "Jed" Glo Herring, MD Resident, Department of Urology

## 2014-10-12 NOTE — Plan of Care (Signed)
Problem: Phase III Progression Outcomes Goal: Voiding independently Outcome: Not Met (add Reason) Patient has chronic foley

## 2014-10-12 NOTE — Progress Notes (Signed)
Patient alert and oriented and denies pain. Dressing site to suprapubic catheter is clean, dry, and intact. And colostomy  Clean and intact. No sign of bleeding at site.

## 2014-10-12 NOTE — Progress Notes (Signed)
8 Days Post-Op  Subjective: Patient awake, alert No questions about surgery Had one BM yesterday  Objective: Vital signs in last 24 hours: Temp:  [98.4 F (36.9 C)-100 F (37.8 C)] 98.8 F (37.1 C) (01/05 0530) Pulse Rate:  [87-90] 87 (01/05 0530) Resp:  [16-24] 24 (01/05 0530) BP: (91-116)/(63-74) 113/72 mmHg (01/05 0530) SpO2:  [97 %-100 %] 98 % (01/05 0530) Last BM Date: 10/11/14  Intake/Output from previous day: 01/04 0701 - 01/05 0700 In: 1210 [P.O.:680; I.V.:130; IV Piggyback:400] Out: 1100 [Urine:1100] Intake/Output this shift:     Lab Results:   Recent Labs  10/11/14 0510 10/12/14 0544  WBC 16.6* 13.7*  HGB 8.2* 8.3*  HCT 26.5* 26.3*  PLT 651* 642*   BMET  Recent Labs  10/11/14 0510 10/12/14 0544  NA 133* 135  K 4.7 4.0  CL 99 98  CO2 31 30  GLUCOSE 266* 81  BUN 12 9  CREATININE 0.78 0.69  CALCIUM 9.5 10.0   PT/INR No results for input(s): LABPROT, INR in the last 72 hours. ABG No results for input(s): PHART, HCO3 in the last 72 hours.  Invalid input(s): PCO2, PO2  Studies/Results: Dg Chest Port 1 View  10/10/2014   CLINICAL DATA:  Pt has fever, chills, and altered mental status. Hx asthma and pneumonia  EXAM: PORTABLE CHEST - 1 VIEW  COMPARISON:  09/29/2014  FINDINGS: Right arm PICC has been placed to the cavoatrial junction. Patchy infiltrate or atelectasis in the inferior lingula, obscuring a portion of the left heart border. Right lung clear. No effusion. Old healed right clavicle fracture.  IMPRESSION: 1. PICC line to the cavoatrial junction. 2. New lingular infiltrate or atelectasis.   Electronically Signed   By: Arne Cleveland M.D.   On: 10/10/2014 12:26    Anti-infectives: Anti-infectives    Start     Dose/Rate Route Frequency Ordered Stop   10/10/14 1930  levofloxacin (LEVAQUIN) tablet 500 mg     500 mg Oral Daily 10/10/14 1821     10/08/14 1000  vancomycin (VANCOCIN) 500 mg in sodium chloride 0.9 % 100 mL IVPB     500 mg100 mL/hr  over 60 Minutes Intravenous Every 12 hours 10/07/14 2225     09/30/14 1400  vancomycin (VANCOCIN) IVPB 1000 mg/200 mL premix  Status:  Discontinued     1,000 mg200 mL/hr over 60 Minutes Intravenous Every 24 hours 09/29/14 1326 09/29/14 1848   09/30/14 1400  vancomycin (VANCOCIN) IVPB 1000 mg/200 mL premix  Status:  Discontinued     1,000 mg200 mL/hr over 60 Minutes Intravenous Every 24 hours 09/29/14 1916 09/30/14 0845   09/30/14 1000  vancomycin (VANCOCIN) IVPB 750 mg/150 ml premix  Status:  Discontinued     750 mg150 mL/hr over 60 Minutes Intravenous Every 12 hours 09/30/14 0845 10/07/14 2225   09/29/14 2200  aztreonam (AZACTAM) 1 g in dextrose 5 % 50 mL IVPB  Status:  Discontinued     1 g100 mL/hr over 30 Minutes Intravenous 3 times per day 09/29/14 1326 09/29/14 1848   09/29/14 2200  aztreonam (AZACTAM) 1 g in dextrose 5 % 50 mL IVPB  Status:  Discontinued     1 g100 mL/hr over 30 Minutes Intravenous 3 times per day 09/29/14 1916 10/01/14 1426   09/29/14 1300  aztreonam (AZACTAM) 2 g in dextrose 5 % 50 mL IVPB     2 g100 mL/hr over 30 Minutes Intravenous STAT 09/29/14 1239 09/29/14 1355   09/29/14 1245  vancomycin (VANCOCIN) IVPB  1000 mg/200 mL premix     1,000 mg200 mL/hr over 60 Minutes Intravenous  Once 09/29/14 1239 09/29/14 1530      Assessment/Plan: s/p Procedure(s): DEBRIDMENT OF DECUBITUS ULCER (N/A) Plan laparoscopic-assisted diverting loop colostomy as well as suprapubic catheter placement by Urology  The surgical procedure has been discussed with the patient.  Potential risks, benefits, alternative treatments, and expected outcomes have been explained.  All of the patient's questions at this time have been answered.  The likelihood of reaching the patient's treatment goal is good.  The patient understand the proposed surgical procedure and wishes to proceed.   LOS: 13 days    Jaretzi Droz K. 10/12/2014

## 2014-10-13 LAB — CBC
HEMATOCRIT: 25.5 % — AB (ref 39.0–52.0)
Hemoglobin: 8 g/dL — ABNORMAL LOW (ref 13.0–17.0)
MCH: 25.9 pg — AB (ref 26.0–34.0)
MCHC: 31.4 g/dL (ref 30.0–36.0)
MCV: 82.5 fL (ref 78.0–100.0)
PLATELETS: 647 10*3/uL — AB (ref 150–400)
RBC: 3.09 MIL/uL — ABNORMAL LOW (ref 4.22–5.81)
RDW: 15.4 % (ref 11.5–15.5)
WBC: 17.3 10*3/uL — ABNORMAL HIGH (ref 4.0–10.5)

## 2014-10-13 LAB — URINE CULTURE
COLONY COUNT: NO GROWTH
Culture: NO GROWTH

## 2014-10-13 LAB — BASIC METABOLIC PANEL
Anion gap: 10 (ref 5–15)
BUN: 8 mg/dL (ref 6–23)
CO2: 25 mmol/L (ref 19–32)
Calcium: 9.5 mg/dL (ref 8.4–10.5)
Chloride: 99 mEq/L (ref 96–112)
Creatinine, Ser: 0.72 mg/dL (ref 0.50–1.35)
GFR calc Af Amer: >90 mL/min (ref >90)
GFR calc non Af Amer: >90 mL/min (ref >90)
Glucose, Bld: 253 mg/dL — ABNORMAL HIGH (ref 70–99)
Potassium: 4.1 mmol/L (ref 3.5–5.1)
Sodium: 134 mmol/L — ABNORMAL LOW (ref 135–145)

## 2014-10-13 LAB — GLUCOSE, CAPILLARY
Glucose-Capillary: 275 mg/dL — ABNORMAL HIGH (ref 70–99)
Glucose-Capillary: 175 mg/dL — ABNORMAL HIGH (ref 70–99)
Glucose-Capillary: 142 mg/dL — ABNORMAL HIGH (ref 70–99)
Glucose-Capillary: 182 mg/dL — ABNORMAL HIGH (ref 70–99)

## 2014-10-13 LAB — VANCOMYCIN, TROUGH: VANCOMYCIN TR: 12.5 ug/mL (ref 10.0–20.0)

## 2014-10-13 MED ORDER — VANCOMYCIN HCL IN DEXTROSE 750-5 MG/150ML-% IV SOLN
750.0000 mg | Freq: Two times a day (BID) | INTRAVENOUS | Status: DC
  Administered 2014-10-13 – 2014-10-14 (×2): 750 mg via INTRAVENOUS
  Filled 2014-10-13 (×3): qty 150

## 2014-10-13 MED ORDER — FENTANYL 12 MCG/HR TD PT72
12.5000 ug | MEDICATED_PATCH | TRANSDERMAL | Status: DC
  Administered 2014-10-13: 12.5 ug via TRANSDERMAL
  Filled 2014-10-13: qty 1

## 2014-10-13 MED ORDER — FENTANYL 25 MCG/HR TD PT72: 25.0000 ug | MEDICATED_PATCH | TRANSDERMAL | Status: DC

## 2014-10-13 MED ORDER — VANCOMYCIN HCL 500 MG IV SOLR: 500.0000 mg | Freq: Two times a day (BID) | INTRAVENOUS | Status: DC

## 2014-10-13 NOTE — Progress Notes (Signed)
Patient ID: QUINTEZ MASELLI, male   DOB: Jun 13, 1986, 29 y.o.   MRN: [id number] 1 Day Post-Op  Subjective: Pt feels well this morning.  Having some flatus in his ostomy.    Objective: Vital signs in last 24 hours: Temp:  [97 F (36.1 C)-98 F (36.7 C)] 98 F (36.7 C) (01/05 1415) Pulse Rate:  [73-86] 86 (01/06 0700) Resp:  [17-20] 20 (01/06 0700) BP: (101-132)/(60-83) 109/70 mmHg (01/06 0700) SpO2:  [100 %] 100 % (01/06 0700) Last BM Date: 10/11/14  Intake/Output from previous day: 01/05 0701 - 01/06 0700 In: 1950 [I.V.:1600; IV Piggyback:350] Out: 7681 [Urine:3425] Intake/Output this shift:    PE: Abd: soft, incisions c/d/i, ostomy with sweat output and quite a bit of flatus already.  Stoma is pink and viable  Lab Results:   Recent Labs  10/12/14 0544 10/13/14 0555  WBC 13.7* 17.3*  HGB 8.3* 8.0*  HCT 26.3* 25.5*  PLT 642* 647*   BMET  Recent Labs  10/12/14 0544 10/13/14 0555  NA 135 134*  K 4.0 4.1  CL 98 99  CO2 30 25  GLUCOSE 81 253*  BUN 9 8  CREATININE 0.69 0.72  CALCIUM 10.0 9.5   PT/INR No results for input(s): LABPROT, INR in the last 72 hours. CMP     Component Value Date/Time   NA 134* 10/13/2014 0555   K 4.1 10/13/2014 0555   CL 99 10/13/2014 0555   CO2 25 10/13/2014 0555   GLUCOSE 253* 10/13/2014 0555   BUN 8 10/13/2014 0555   CREATININE 0.72 10/13/2014 0555   CALCIUM 9.5 10/13/2014 0555   PROT 6.8 10/05/2014 0500   ALBUMIN 2.1* 10/05/2014 0500   AST 12 10/05/2014 0500   ALT 8 10/05/2014 0500   ALKPHOS 87 10/05/2014 0500   BILITOT 0.2* 10/05/2014 0500   GFRNONAA >90 10/13/2014 0555   GFRAA >90 10/13/2014 0555   Lipase     Component Value Date/Time   LIPASE 8* 08/12/2014 1840       Studies/Results: No results found.  Anti-infectives: Anti-infectives    Start     Dose/Rate Route Frequency Ordered Stop   10/12/14 1215  ciprofloxacin (CIPRO) IVPB 400 mg     400 mg200 mL/hr over 60 Minutes Intravenous To Surgery  10/12/14 1203 10/12/14 1210   10/10/14 1930  levofloxacin (LEVAQUIN) tablet 500 mg     500 mg Oral Daily 10/10/14 1821     10/08/14 1000  vancomycin (VANCOCIN) 500 mg in sodium chloride 0.9 % 100 mL IVPB     500 mg100 mL/hr over 60 Minutes Intravenous Every 12 hours 10/07/14 2225     09/30/14 1400  vancomycin (VANCOCIN) IVPB 1000 mg/200 mL premix  Status:  Discontinued     1,000 mg200 mL/hr over 60 Minutes Intravenous Every 24 hours 09/29/14 1326 09/29/14 1848   09/30/14 1400  vancomycin (VANCOCIN) IVPB 1000 mg/200 mL premix  Status:  Discontinued     1,000 mg200 mL/hr over 60 Minutes Intravenous Every 24 hours 09/29/14 1916 09/30/14 0845   09/30/14 1000  vancomycin (VANCOCIN) IVPB 750 mg/150 ml premix  Status:  Discontinued     750 mg150 mL/hr over 60 Minutes Intravenous Every 12 hours 09/30/14 0845 10/07/14 2225   09/29/14 2200  aztreonam (AZACTAM) 1 g in dextrose 5 % 50 mL IVPB  Status:  Discontinued     1 g100 mL/hr over 30 Minutes Intravenous 3 times per day 09/29/14 1326 09/29/14 1848   09/29/14 2200  aztreonam (AZACTAM) 1  g in dextrose 5 % 50 mL IVPB  Status:  Discontinued     1 g100 mL/hr over 30 Minutes Intravenous 3 times per day 09/29/14 1916 10/01/14 1426   09/29/14 1300  aztreonam (AZACTAM) 2 g in dextrose 5 % 50 mL IVPB     2 g100 mL/hr over 30 Minutes Intravenous STAT 09/29/14 1239 09/29/14 1355   09/29/14 1245  vancomycin (VANCOCIN) IVPB 1000 mg/200 mL premix     1,000 mg200 mL/hr over 60 Minutes Intravenous  Once 09/29/14 1239 09/29/14 1530       Assessment/Plan  1. POD 1, s/p laparoscopic loop diverting colostomy  2. 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Plan: 1. Patient doing well from surgical standpoint.  His diet is being advanced as tolerates with no issues.    LOS: 14 days    Boss Danielsen E 10/13/2014, 8:47 AM Pager: [phone]

## 2014-10-13 NOTE — Consult Note (Signed)
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  S/P debridement per CCS. Attempted VAC dressing previously and hydrotherapy has been provided.  Due to large amount of loose stools VAC was not successful. New diverting ostomy performed yesterday.    Measurement: Sacrum: 8cm x 5cm x 3cm  Did not measure right ischial wound today, will obtain new measurements on Friday.  Wound bed: Sacrum: 50% yellow 50% pink, non granular at this point. Some minor skin necrosis at proximal wound edge Drainage (amount, consistency, odor) moderate, non purulent Periwound:intact, one area just adjacent to the sacral wound that is full thickness   Dressing procedure/placement/frequency: 1pc of black granufoam used to fill the wound bed, 1pc of black granufoam used to bridge the wound to the right trochanter.  Continue NS dressings for the right ischial wound until complete empty of the rectal area.  Plan to add R ischial wound with Friday VAC change or HHRN can add if DC prior to this.   WOC ostomy consult note Stoma type/location:  LLQ, loop colostomy  Stomal assessment/size: 2" round, pink, budded from the skin Peristomal assessment: pouch intact from OR 10/12/14 Treatment options for stomal/peristomal skin: none needed  Output flatus, some minimal bloody drainage Ostomy pouching: 2pc. In place from [place]  Education provided: Met prior to surgery to explain ostomy.  Demonstrated gas evacuation from his pouch today.  Will perform pouch change with patient and mother tomorrow.  Supplies to the room for ostomy care.   [place] team will follow along with you for ostomy education, care, and NPWT VAC dressings.  Dafna Romo [place] RN,CWOCN [phone]

## 2014-10-13 NOTE — Progress Notes (Signed)
ANTIBIOTIC CONSULT NOTE - FOLLOW UP  Pharmacy Consult:  Vancomycin Indication:  Ischial abscess of decubitus ulcer  Allergies  Allergen Reactions  . Cefuroxime Axetil Anaphylaxis  . Penicillins Anaphylaxis and Other (See Comments)    ?can take amoxicillin?  [name] [Benzonatate] Anaphylaxis  . Shellfish Allergy Itching and Other (See Comments)    Took benadryl to alleviate reaction    Patient Measurements: Height: 5\' 8"  (172.7 cm) Weight: 125 lb 10.6 oz (57 kg) IBW/kg (Calculated) : 68.4  Vital Signs: Temp: 98.3 F (36.8 C) (01/06 1512) Temp Source: Oral (01/06 1512) BP: 109/66 mmHg (01/06 1512) Pulse Rate: 89 (01/06 1512) Intake/Output from previous day: 01/05 0701 - 01/06 0700 In: 1950 [I.V.:1600; IV Piggyback:350] Out: 7062 [Urine:3425] Intake/Output from this shift: Total I/O In: -  Out: 325 [Urine:325]  Labs:  Recent Labs  10/11/14 0510 10/12/14 0544 10/13/14 0555  WBC 16.6* 13.7* 17.3*  HGB 8.2* 8.3* 8.0*  PLT 651* 642* 647*  CREATININE 0.78 0.69 0.72   Estimated Creatinine Clearance: 108.9 mL/min (by C-G formula based on Cr of 0.72).  Recent Labs  10/13/14 2105  [place] 12.5     Microbiology: Recent Results (from the past 720 hour(s))  Blood Culture (routine x 2)     Status: None   Collection Time: 09/16/14  3:25 AM  Result Value Ref Range Status   Specimen Description BLOOD RIGHT ARM  Final   Special Requests BOTTLES DRAWN AEROBIC AND ANAEROBIC 10CC  Final   Culture  Setup Time   Final    09/16/2014 08:58 Performed at [place]   Final    NO GROWTH 5 DAYS Performed at [facility]    Report Status 09/22/2014 FINAL  Final  Blood Culture (routine x 2)     Status: None   Collection Time: 09/16/14  3:30 AM  Result Value Ref Range Status   Specimen Description BLOOD LEFT ARM  Final   Special Requests BOTTLES DRAWN AEROBIC ONLY 10CC  Final   Culture  Setup Time   Final    09/16/2014 08:58 Performed at  [place]   Final    NO GROWTH 5 DAYS Performed at [facility]    Report Status 09/22/2014 FINAL  Final  Urine culture     Status: None   Collection Time: 09/16/14  4:09 AM  Result Value Ref Range Status   Specimen Description URINE, RANDOM  Final   Special Requests NONE  Final   Culture  Setup Time   Final    09/16/2014 08:45 Performed at [place]   Final    >=100,000 COLONIES/ML Performed at [place]   Final    KLEBSIELLA SPECIES Performed at [facility]    Report Status 09/18/2014 FINAL  Final   Organism ID, Bacteria KLEBSIELLA SPECIES  Final      Susceptibility   Klebsiella species - MIC*    AMPICILLIN >=32 RESISTANT Resistant     CEFAZOLIN <=4 SENSITIVE Sensitive     CEFTRIAXONE <=1 SENSITIVE Sensitive     CIPROFLOXACIN <=0.25 SENSITIVE Sensitive     GENTAMICIN <=1 SENSITIVE Sensitive     NITROFURANTOIN <=16 SENSITIVE Sensitive     TOBRAMYCIN <=1 SENSITIVE Sensitive     TRIMETH/SULFA <=20 SENSITIVE Sensitive     PIP/TAZO 8 SENSITIVE Sensitive     * KLEBSIELLA SPECIES  MRSA PCR Screening  Status: None   Collection Time: 09/16/14  7:08 AM  Result Value Ref Range Status   MRSA by PCR NEGATIVE NEGATIVE Final    Comment:        The GeneXpert MRSA Assay (FDA approved for NASAL specimens only), is one component of a comprehensive MRSA colonization surveillance program. It is not intended to diagnose MRSA infection nor to guide or monitor treatment for MRSA infections.   Culture, respiratory (tracheal aspirate)     Status: None   Collection Time: 09/17/14  6:19 AM  Result Value Ref Range Status   Specimen Description TRACHEAL ASPIRATE  Final   Special Requests NONE  Final   Gram Stain   Final    FEW WBC PRESENT,BOTH PMN AND MONONUCLEAR RARE SQUAMOUS EPITHELIAL CELLS PRESENT NO ORGANISMS SEEN Performed at [facility]    Culture   Final    FEW  STAPHYLOCOCCUS AUREUS Note: RIFAMPIN AND GENTAMICIN SHOULD NOT BE USED AS SINGLE DRUGS FOR TREATMENT OF STAPH INFECTIONS. Performed at [facility]    Report Status 09/20/2014 FINAL  Final   Organism ID, Bacteria STAPHYLOCOCCUS AUREUS  Final      Susceptibility   Staphylococcus aureus - MIC*    CLINDAMYCIN >=8 RESISTANT Resistant     ERYTHROMYCIN >=8 RESISTANT Resistant     GENTAMICIN <=0.5 SENSITIVE Sensitive     LEVOFLOXACIN 0.25 SENSITIVE Sensitive     OXACILLIN 1 SENSITIVE Sensitive     PENICILLIN >=0.5 RESISTANT Resistant     RIFAMPIN <=0.5 SENSITIVE Sensitive     TRIMETH/SULFA <=10 SENSITIVE Sensitive     VANCOMYCIN 1 SENSITIVE Sensitive     TETRACYCLINE >=16 RESISTANT Resistant     MOXIFLOXACIN <=0.25 SENSITIVE Sensitive     * FEW STAPHYLOCOCCUS AUREUS  Clostridium Difficile by PCR     Status: None   Collection Time: 09/21/14 10:10 PM  Result Value Ref Range Status   C difficile by pcr NEGATIVE NEGATIVE Final  Blood Culture (routine x 2)     Status: None   Collection Time: 09/29/14 12:16 PM  Result Value Ref Range Status   Specimen Description BLOOD LEFT HAND  Final   Special Requests BOTTLES DRAWN AEROBIC AND ANAEROBIC 5CC  Final   Culture  Setup Time   Final    09/29/2014 18:08 Performed at [place]   Final    NO GROWTH 5 DAYS Performed at [facility]    Report Status 10/05/2014 FINAL  Final  Blood Culture (routine x 2)     Status: None   Collection Time: 09/29/14  1:20 PM  Result Value Ref Range Status   Specimen Description BLOOD RIGHT ANTECUBITAL  Final   Special Requests BOTTLES DRAWN AEROBIC AND ANAEROBIC 5ML  Final   Culture  Setup Time   Final    09/29/2014 18:07 Performed at [place]   Final    NO GROWTH 5 DAYS Performed at [facility]    Report Status 10/05/2014 FINAL  Final  Urine culture     Status: None   Collection Time: 09/29/14  6:28 PM  Result Value Ref Range Status    Specimen Description URINE, CLEAN CATCH  Final   Special Requests NONE  Final   Culture  Setup Time   Final    09/30/2014 02:39 Performed at [place] Performed at [facility]   Final   Culture NO  GROWTH Performed at [facility]   Final   Report Status 09/30/2014 FINAL  Final  MRSA PCR Screening     Status: None   Collection Time: 10/04/14  3:22 AM  Result Value Ref Range Status   MRSA by PCR NEGATIVE NEGATIVE Final    Comment:        The GeneXpert MRSA Assay (FDA approved for NASAL specimens only), is one component of a comprehensive MRSA colonization surveillance program. It is not intended to diagnose MRSA infection nor to guide or monitor treatment for MRSA infections.   Tissue culture     Status: None   Collection Time: 10/04/14  1:46 PM  Result Value Ref Range Status   Specimen Description TISSUE SACRAL  Final   Special Requests NONE  Final   Gram Stain   Final    NO WBC SEEN NO ORGANISMS SEEN Performed at [facility]    Culture   Final    MULTIPLE ORGANISMS PRESENT, NONE PREDOMINANT Note: NO STAPHYLOCOCCUS AUREUS ISOLATED NO GROUP A STREP (S.PYOGENES) ISOLATED Performed at [facility]    Report Status 10/07/2014 FINAL  Final  Anaerobic culture     Status: None   Collection Time: 10/04/14  1:46 PM  Result Value Ref Range Status   Specimen Description TISSUE SACRAL  Final   Special Requests NONE  Final   Gram Stain   Final    NO WBC SEEN NO ORGANISMS SEEN Performed at [facility]    Culture   Final    NO ANAEROBES ISOLATED Performed at [facility]    Report Status 10/10/2014 FINAL  Final  Clostridium Difficile by PCR     Status: None   Collection Time: 10/06/14  2:58 PM  Result Value Ref Range Status   C difficile by pcr NEGATIVE NEGATIVE Final  Culture, Urine     Status: None   Collection Time: 10/11/14  9:00 AM  Result Value Ref Range Status   Specimen  Description URINE, RANDOM  Final   Special Requests NONE  Final   Colony Count NO GROWTH Performed at [facility]   Final   Culture NO GROWTH Performed at [facility]   Final   Report Status 10/12/2014 FINAL  Final  Surgical pcr screen     Status: None   Collection Time: 10/12/14  2:38 AM  Result Value Ref Range Status   MRSA, PCR NEGATIVE NEGATIVE Final   Staphylococcus aureus NEGATIVE NEGATIVE Final    Comment:        The Xpert SA Assay (FDA approved for NASAL specimens in patients over 75 years of age), is one component of a comprehensive surveillance program.  Test performance has been validated by [employer] for patients greater than or equal to 3 year old. It is not intended to diagnose infection nor to guide or monitor treatment.   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  His renal function is stable and vancomycin trough  is sub-therapeutic.  Noted patient is quadriplegic.  Aztreo 12/23 >> 12/25 LVQ 1/3 >> Vanc 12/23 >> - 12/25 VT: 19 mcg/mL >> con't 750mg  q12 - 12/31: VT: 24 mcg/mL  >> dec 500mg  q12 - 1/6 VT: 12.5 mcg/mL on 500mg  q12h >> 750mg  q12  12/23 urine- neg 12/23 blood x2- neg 12/31 tissue: neg    Goal of Therapy:  Vancomycin trough level 15-20 mcg/ml   Plan:  - Increase vanc back to 750mg  IV Q12H - Monitor renal fxn, clinical progress, and vanc trough PRN   Flonnie Wierman D. Mina Marble, PharmD, BCPS Pager:  [phone] 10/13/2014, 10:02 PM

## 2014-10-13 NOTE — Progress Notes (Signed)
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  Now 2 weeks off vancomycin, chronic indwelling Foley catheter, not on home oxygen, presented to ED with altered mental status/somnolence, leukocytosis, fever and chills. Infectious disease consulting. Patient sepsis attributed to right ischial tuberosity decubitus ulcer. Surgery consulted and patient underwent debridement and bone biopsy for osteomyelitis on 12/28. PICC line placed. ID recommending 6-8 weeks of IV vancomycin. General surgery and Urology performed a joint procedure on 1/5 to place a diverting colostomy and suprapubic catheter.  The patient is planned for discharge to home with home health services and wound vac as well as IV vancomycin on 1/7.   Subjective: - no complaints this morning.  Patient smiling.  Appears relaxed.  Mother at bedside.  Assessment/Plan:  Sepsis/fever:  Likely from infected right ischial tuberosity decubitus ulcer with underlying osteomyelitis. Foley catheter changed this admission. Blood cultures 2 negative to date. Persistent left lower lobe consolidation on chest x-ray but no respiratory symptoms. Recently treated for MSSA bacteremia. Recent TEE was negative. Wound care consultation appreciated. Continue IV vancomycin. Aztreonam discontinued by ID on 12/26.   Levaquin was started on 1/3 as patient was running a low grade fever for several days.  Likely due to atelectasis.  Patient has been afebrile for 48 hours.  Will discontinue levaquin on 1/6.  Sacral decubitus ulcer/pelvic OM:  MRI shows osteomyelitis of right inferior pubic ramus, but no osteo in smaller left distal tuberosity.  Status post right decubitus ulcer debridement 12/28. Patient has  multiple sacral/buttock stage III ulcers, right ischial stage IV ulcer, left foot with several full-thickness wounds, fifth toe and fourth toe wound. Management per wound care team. WOC has recommended hydrotherapy and wound vacs as appropriate.  Surgery completed diverting ostomy 1/5.  Infectious disease recommends 6 weeks of IV vancomycin.  PICC line in place.  Pathology results are below.  1/6 Adjusting pain medications in preparation for d/c to home.  Patient has been taking IV dilaudid.  Refuses morphine products and states the oxy does not work for him.  Will trial a fentanyl patch 12.5 mcg this evening.  Chronic Foley catheter and traumatic hypospadias: Patient states that urology had consulted in the past and recommended suprapubic catheter but he was not medically stable to get it at that time. Suprapubic catheter was placed in a combination operation at the same time he received the diverting colostomy on 1/5.  Catheter working well.  Mild hyperkalemia  Potassium trending up despite good kidney function, unclear etiology, CBGs with good control, renal function intact, ? Multiple po supplements and increased intake.  S/p Kayexelate 12/31, K better but trends up slowly.  Will continue to monitor.  Acute encephalopathy: Secondary to problem #1. Resolved. No focal deficits.  Acute kidney injury: Related to sepsis and dehydration. Resolved.  Type II DM/IDDM: Brittle with some episodes of hypoglycemia this admission.  Continue Lantus and SSI and adjust as needed. Will adjust insulins for uncontrolled DM 2. Will decrease lantus from 30 to 26 units on 12/31.  1/6 CBGs appear to be climbing again.  Will monitor over night but will likely resume previous dose of 30 units of insulin.  History of severe protein calorie malnutrition: Continue nutritional supplements.  History of GERD: Continue PPI.  History of spinal cord infarction and quadriparesis:  Paraplegic in lower extremities and weak in the  upper extremities. Patient has had an indwelling Foley catheter since April 2015 which was transitioned to suprapubic catheter on 1/5.  Dehydration with hyponatremia: resolved. DC IVF  Leukocytosis: Secondary to problem #1. Follow CBCs. Improving.  Thrombocytosis:  Likely reactive to wounds and wound treatment.  History of recently treated MSSA bacteremia: Continue above antibiotics and follow blood culture results.  Hypertension: Soft blood pressures remain. Hold antihypertensives.  Chronic benzodiazepine use: Continue same.  Acute on chronic Anemia: Likely secondary to acute illness and dilutional. No reported bleeding. Stable.     Code Status:  Full Family Communication:  Discussed with mother Disposition Plan:  Inpatient. D/c to home with full home health services 1/7  Consultants:  Infectious disease  General surgery  Urology   Procedures:  Changed Foley catheter  Debridement of decubitus ulcer 12/28  PICC line placed 12/28  Antibiotics:  IV vancomycin and 12/23 >  IV aztreonam 12/23 > 12/26  Levofloxacin 1/3 >>  Objective: Filed Vitals:   10/12/14 1348 10/12/14 1415 10/13/14 0700 10/13/14 1512  BP: 123/76 101/60 109/70 109/66  Pulse: 74 74 86 89  Temp:  98 F (36.7 C)  98.3 F (36.8 C)  TempSrc:  Oral Oral Oral  Resp: 17 18 20 18   Height:      Weight:      SpO2: 100% 100% 100% 100%    Intake/Output Summary (Last 24 hours) at 10/13/14 1631 Last data filed at 10/13/14 1405  Gross per 24 hour  Intake   1480 ml  Output   1450 ml  Net     30 ml   Filed Weights   10/01/14 0408 10/02/14 0455 10/02/14 1841  Weight: 55.4 kg (122 lb 2.2 oz) 55.3 kg (121 lb 14.6 oz) 57 kg (125 lb 10.6 oz)   Exam: General exam:   thinly nourished chronically ill-looking young male.  Alert, orientated with clear speech. Mother at bedside. Respiratory system: Clear. No increased work of breathing. Cardiovascular system: S1 & S2 heard, RRR. No JVD, murmurs, gallops,  clicks or pedal edema. Gastrointestinal system: Abdomen is thin, nondistended, soft and nontender. Normal bowel sounds heard.  New colostomy placed.  Site looks good, air in colostomy bag.   Extremities:  Grade 0 x 5 power in lower extremities and grade 3 x 5 power in upper extremities proximally and 0 x 5 power distally with contractures of fingers. Skin: no rash, bruises or lesions, sacral area not examined.  Data Reviewed: Basic Metabolic Panel:  Recent Labs Lab 10/09/14 1125 10/10/14 0605 10/11/14 0510 10/12/14 0544 10/13/14 0555  NA 135 131* 133* 135 134*  K 4.3 4.8 4.7 4.0 4.1  CL 102 99 99 98 99  CO2 22 28 31 30 25   GLUCOSE 75 121* 266* 81 253*  BUN 8 8 12 9 8   CREATININE 0.54 0.67 0.78 0.69 0.72  CALCIUM 9.4 9.2 9.5 10.0 9.5   CBC:  Recent Labs Lab 10/07/14 0545 10/08/14 0515 10/11/14 0510 10/12/14 0544 10/13/14 0555  WBC 12.0* 11.6* 16.6* 13.7* 17.3*  HGB 8.5* 8.0* 8.2* 8.3* 8.0*  HCT 27.0* 25.2* 26.5* 26.3* 25.5*  MCV 84.6 85.4 83.3 84.8 82.5  PLT 712* 716* 651* 642* 647*   BNP (last 3 results)  Recent Labs  01/29/14 2113  PROBNP 1343.0*   CBG:  Recent Labs Lab 10/12/14 1713 10/12/14 2218 10/12/14 2329 10/13/14 0758 10/13/14 1200  GLUCAP 85 251* 305* 275* 175*   Surgical Pathology 10/04/2014  1.  Debridement, Sacral decubitus - BENIGN SKIN AND SUBCUTANEOUS SOFT TISSUE WITH FIBROSIS, NECROSIS, AND ACUTE INFLAMMATION. - NEGATIVE FOR MALIGNANCY. 2. Debridement, Right ischium - BENIGN SKIN AND SUBCUTANEOUS SOFT TISSUE WITH NECROSIS, FIBROSIS, ACUTE AND CHRONIC INFLAMMATION AND ABSCESS FORMATION. - NEGATIVE FOR MALIGNANCY.  Recent Results (from the past 240 hour(s))  MRSA PCR Screening     Status: None   Collection Time: 10/04/14  3:22 AM  Result Value Ref Range Status   MRSA by PCR NEGATIVE NEGATIVE Final    Comment:        The GeneXpert MRSA Assay (FDA approved for NASAL specimens only), is one component of a comprehensive MRSA  colonization surveillance program. It is not intended to diagnose MRSA infection nor to guide or monitor treatment for MRSA infections.   Tissue culture     Status: None   Collection Time: 10/04/14  1:46 PM  Result Value Ref Range Status   Specimen Description TISSUE SACRAL  Final   Special Requests NONE  Final   Gram Stain   Final    NO WBC SEEN NO ORGANISMS SEEN Performed at [facility]    Culture   Final    MULTIPLE ORGANISMS PRESENT, NONE PREDOMINANT Note: NO STAPHYLOCOCCUS AUREUS ISOLATED NO GROUP A STREP (S.PYOGENES) ISOLATED Performed at [facility]    Report Status 10/07/2014 FINAL  Final  Anaerobic culture     Status: None   Collection Time: 10/04/14  1:46 PM  Result Value Ref Range Status   Specimen Description TISSUE SACRAL  Final   Special Requests NONE  Final   Gram Stain   Final    NO WBC SEEN NO ORGANISMS SEEN Performed at [facility]    Culture   Final    NO ANAEROBES ISOLATED Performed at [facility]    Report Status 10/10/2014 FINAL  Final  Clostridium Difficile by PCR     Status: None   Collection Time: 10/06/14  2:58 PM  Result Value Ref Range Status   C difficile by pcr NEGATIVE NEGATIVE Final  Culture, Urine     Status: None   Collection Time: 10/11/14  9:00 AM  Result Value Ref Range Status   Specimen Description URINE, RANDOM  Final   Special Requests NONE  Final   Colony Count NO GROWTH Performed at [facility]   Final   Culture NO GROWTH Performed at [facility]   Final   Report Status 10/12/2014 FINAL  Final  Surgical pcr screen     Status: None   Collection Time: 10/12/14  2:38 AM  Result Value Ref Range Status   MRSA, PCR NEGATIVE NEGATIVE Final   Staphylococcus aureus NEGATIVE NEGATIVE Final    Comment:        The Xpert SA Assay (FDA approved for NASAL specimens in patients over 18 years of age), is one component of a comprehensive surveillance program.  Test  performance has been validated by [employer] for patients greater than or equal to 51 year old. It is not intended to diagnose infection nor to guide or monitor treatment.   Urine culture     Status: None   Collection Time: 10/12/14 12:41 PM  Result Value Ref Range Status   Specimen Description URINE, RANDOM CYSTOSCOPE  Final   Special Requests NONE  Final   Colony Count NO GROWTH Performed at [facility]   Final   Culture NO GROWTH Performed at [facility]  Final   Report Status 10/13/2014 FINAL  Final    Studies: No results found. Scheduled Meds: . antiseptic oral rinse  7 mL Mouth Rinse BID  . atorvastatin  20 mg Oral Daily  . bacitracin   Topical BID  . collagenase   Topical Daily  . feeding supplement (GLUCERNA SHAKE)  237 mL Oral TID BM  . heparin  5,000 Units Subcutaneous 3 times per day  . insulin aspart  0-15 Units Subcutaneous TID WC  . insulin aspart  0-5 Units Subcutaneous QHS  . insulin aspart  3 Units Subcutaneous TID WC  . insulin glargine  26 Units Subcutaneous QHS  . levofloxacin  500 mg Oral Daily  . loratadine  10 mg Oral Daily  . metoCLOPramide  5 mg Oral Once per day on Mon Thu  . mupirocin cream   Topical BID  . pantoprazole  40 mg Oral Daily  . polyethylene glycol  17 g Oral Daily  . pregabalin  100 mg Oral QID  . sodium chloride  3 mL Intravenous Q12H  . vancomycin  500 mg Intravenous Q12H   Continuous Infusions: . lactated ringers 50 mL/hr at 10/13/14 0715   Principal Problem:   Osteomyelitis, pelvis Active Problems:   GERD   Protein-calorie malnutrition, severe   IDDM (insulin dependent diabetes mellitus)   Spinal cord infarction (history of)   Sepsis   UTI (lower urinary tract infection)   Cellulitis and abscess of leg, except foot   Abscess   Staphylococcus aureus bacteremia with sepsis   AKI (acute kidney injury)  Imogene Burn, PA-C [facility] Pager: [phone]    If 7PM-7AM, please contact  night-coverage [email or web address] Password [email or web address] 10/13/2014, 4:31 PM    LOS: 14 days   I have seen and evaluated this patient and agree with the PA notes. Patient feeling better, happy with the fentanyl patch. Patient's mom (primary care giver) updated, all questions answered. Both agree with d/c home tomorrow.

## 2014-10-13 NOTE — Clinical Social Work Note (Signed)
CSW continues to follow for any CSW related DC needs.    Liz Beach MSW, [place], [place], [phone]

## 2014-10-14 ENCOUNTER — Encounter (HOSPITAL_COMMUNITY): Payer: Self-pay | Admitting: Surgery

## 2014-10-14 DIAGNOSIS — L0291 Cutaneous abscess, unspecified: Secondary | ICD-10-CM

## 2014-10-14 LAB — GLUCOSE, CAPILLARY
Glucose-Capillary: 104 mg/dL — ABNORMAL HIGH (ref 70–99)
Glucose-Capillary: 215 mg/dL — ABNORMAL HIGH (ref 70–99)
Glucose-Capillary: 139 mg/dL — ABNORMAL HIGH (ref 70–99)

## 2014-10-14 LAB — CBC
HEMATOCRIT: 25.3 % — AB (ref 39.0–52.0)
HEMOGLOBIN: 7.8 g/dL — AB (ref 13.0–17.0)
MCH: 25.7 pg — ABNORMAL LOW (ref 26.0–34.0)
MCHC: 30.8 g/dL (ref 30.0–36.0)
MCV: 83.5 fL (ref 78.0–100.0)
Platelets: 604 10*3/uL — ABNORMAL HIGH (ref 150–400)
RBC: 3.03 MIL/uL — AB (ref 4.22–5.81)
RDW: 15.5 % (ref 11.5–15.5)
WBC: 12.7 10*3/uL — AB (ref 4.0–10.5)

## 2014-10-14 LAB — PREPARE RBC (CROSSMATCH)

## 2014-10-14 MED ORDER — HEPARIN SOD (PORK) LOCK FLUSH 100 UNIT/ML IV SOLN
250.0000 [IU] | INTRAVENOUS | Status: AC | PRN
  Administered 2014-10-14: 250 [IU]

## 2014-10-14 MED ORDER — SODIUM CHLORIDE 0.9 % IV SOLN
Freq: Once | INTRAVENOUS | Status: AC
  Administered 2014-10-14: 15:00:00 via INTRAVENOUS

## 2014-10-14 MED ORDER — INSULIN GLARGINE 100 UNIT/ML ~~LOC~~ SOLN
28.0000 [IU] | Freq: Every day | SUBCUTANEOUS | Status: DC
  Filled 2014-10-14: qty 0.28

## 2014-10-14 MED ORDER — BACLOFEN 10 MG PO TABS: 5.0000 mg | ORAL_TABLET | Freq: Three times a day (TID) | ORAL | Status: DC

## 2014-10-14 MED ORDER — FENTANYL 12 MCG/HR TD PT72: 12.5000 ug | MEDICATED_PATCH | TRANSDERMAL | Status: DC

## 2014-10-14 MED ORDER — INSULIN GLARGINE 100 UNIT/ML ~~LOC~~ SOLN: 28.0000 [IU] | Freq: Every day | SUBCUTANEOUS | Status: DC

## 2014-10-14 NOTE — Discharge Summary (Signed)
Physician Discharge Summary  Jason [name] BJY:[id number] DOB: 1986/03/28 DOA: 09/29/2014  PCP: [name], MD  Admit date: 09/29/2014 Discharge date: 10/14/2014  Time spent: 60 minutes  Recommendations for Outpatient Follow-up:  1. [place] Appointment Scheduled. [place] for wound care, PICC maintenance, and Medication administration 3. Patient will be on IV Vanc.  Draw vanc levels as appropriate. 4.   CBC / BMET in 1 week in PCP office. (Thrombocytosis, leukocytosis, anemia, hyponatremia, hyperkalemia) 5.   Follow up with [facility] in 1 month 6.   Follow up with [facility] general surgery as requested.  Discharge Diagnoses:  Principal Problem:   Osteomyelitis, pelvis Active Problems:   GERD   Protein-calorie malnutrition, severe   IDDM (insulin dependent diabetes mellitus)   Spinal cord infarction (history of)   Sepsis   UTI (lower urinary tract infection)   Cellulitis and abscess of leg, except foot   Abscess   Staphylococcus aureus bacteremia with sepsis   AKI (acute kidney injury)   Wound Care Recommendations: HHRN will need to add NPWT dressings to the right ischial wound with the next dressing change on Friday 10/15/14.  Address wound Vacs as appropriate.  Discharge Condition: stable.  Diet recommendation: regular  Filed Weights   10/01/14 0408 10/02/14 0455 10/02/14 1841  Weight: 55.4 kg (122 lb 2.2 oz) 55.3 kg (121 lb 14.6 oz) 57 kg (125 lb 10.6 oz)    History of present illness:  29 year old male patient with history of spinal infarct in April 2015, quadriparesis/paraplegia, poorly controlled type II DM, recently completed treatment for MSSA bacteremia. Presented to ED with altered mental status/somnolence, leukocytosis, fever and chills. Infectious disease consulted. Patient's sepsis attributed to right ischial tuberosity decubitus ulcer. Surgery consulted and patient underwent debridement and bone biopsy for osteomyelitis on 12/28.  PICC line placed. ID recommended 6-8 weeks of IV vancomycin. General surgery and Urology performed a joint procedure on 1/5 to place a diverting colostomy and suprapubic catheter. The patient is planned for discharge to home with home health services and wound vac as well as IV vancomycin on 1/7.  Hospital Course:   Sepsis/fever:  Likely from infected right ischial tuberosity decubitus ulcer with underlying osteomyelitis.  Blood cultures 2 negative. Persistent left lower lobe consolidation on chest x-ray but no respiratory symptoms. Recently treated for MSSA bacteremia. Recent TEE was negative. Wound care consultation appreciated. Continue IV vancomycin. Aztreonam discontinued by ID on 12/26.   Levaquin was started on 1/3 as patient was running a low grade fever for several days. After he had been afebrile for 48 hours the Levaquin was discontinued. In hindsight, it appears that the low-grade fever was likely due to atelectasis.  Sacral decubitus ulcer/pelvic OM:  MRI shows osteomyelitis of right inferior pubic ramus, but no osteo in smaller left distal tuberosity. Status post right decubitus ulcer debridement 12/28. Patient has multiple sacral/buttock stage III ulcers, right ischial stage IV ulcer, left foot with several full-thickness wounds, fifth toe and fourth toe wound. Management per wound care team. WOC has recommended hydrotherapy and wound vacs as appropriate. Surgery completed diverting ostomy 1/5. Infectious disease recommends 6 weeks of IV vancomycin. PICC line in place. Pathology results are below.  Adjusted pain medications in preparation for d/c to home. Patient has been taking IV dilaudid in the hospital. Refuses morphine products and states the oxy does not work for him. Mr. [name] pain was controlled with a 12.5 g fentanyl patch. His baclofen dosage was reduced to 5 mg  3 times a day when necessary. Percocet/oxycodone was discontinued.  Chronic Foley catheter and  traumatic hypospadias: Patient states that urology had consulted in the past and recommended suprapubic catheter but he was not medically stable to get it at that time. Suprapubic catheter was placed in a combined operation when he received the diverting colostomy on 1/5. Catheter working well.  Mild hyperkalemia  Potassium trending up despite good kidney function, unclear etiology.   S/p Kayexelate 12/31, K better but trends up slowly. Recommend continued intermittent monitoring.   Acute encephalopathy: Secondary to problem #1. Resolved. No focal deficits.  Acute kidney injury: Related to sepsis and dehydration. Resolved.  Type II DM/IDDM: Brittle with some episodes of hypoglycemia and hyperglycemia this admission. As an inpatient, he was continued on Lantus and NovoLog. Will discharge on a slightly higher dose of Lantus as patient is eating well.  History of severe protein calorie malnutrition: Continue nutritional supplements.  History of GERD: Continue PPI.  History of spinal cord infarction and quadriparesis: Paraplegic in lower extremities and weak in the upper extremities. Patient has had an indwelling Foley catheter since April 2015 which was transitioned to suprapubic catheter on 1/5.  Dehydration with hyponatremia: resolved. DC IVF  Leukocytosis: Secondary to problem #1. Follow CBCs. Improving.  Thrombocytosis: Likely reactive to wounds and wound treatment.  Platelet trend is improving. 604,000 at discharge.  History of recently treated MSSA bacteremia:  Blood cultures negative this admission.  Hypertension: Soft blood pressures remain. Held antihypertensives. Amlodipine discontinued.  Chronic benzodiazepine use: Continue same.  Acute on chronic Anemia: Likely secondary to acute illness and dilution. No reported bleeding. Hgb slowly trended down to 7.8.  Patient received 1 unit of PRBCs on 1/7 prior to discharge.   Procedures:  Surgical wound debridment  Diverting  colostomy placement  Suprapubic catheter placement  PICC placement  1 unit of PRBCs transfused.  Consultations:  Surgery  Infectious Disease  Urology  Discharge Exam: Filed Vitals:   10/13/14 2205 10/14/14 1400 10/14/14 1446 10/14/14 1518  BP: 104/66 102/62 90/54 92/56   Pulse: 87 86 88 86  Temp: 99.1 F (37.3 C) 98.4 F (36.9 C) 99.3 F (37.4 C) 99 F (37.2 C)  TempSrc: Oral Oral Oral Oral  Resp: 18 20 20 20   Height:      Weight:      SpO2: 100% 100% 100% 100%   General exam: thinly nourished chronically ill-looking young male. Alert, orientated with clear speech. Mother at bedside. Patient smiling. Respiratory system: Clear. No increased work of breathing. Cardiovascular system: S1 & S2 heard, RRR. No JVD, murmurs, gallops, clicks or pedal edema. Gastrointestinal system: Abdomen is thin, nondistended, soft and nontender. Normal bowel sounds heard. New colostomy placed. Site looks good, air in colostomy bag.  Extremities: Grade 0 x 5 power in lower extremities and grade 3 x 5 power in upper extremities proximally and 0 x 5 power distally with contractures of fingers. Skin: no rash, bruises or lesions, sacral area not examined.    Discharge Instructions    Current Discharge Medication List    START taking these medications   Details  fentaNYL (DURAGESIC - DOSED MCG/HR) 12 MCG/HR Place 1 patch (12.5 mcg total) onto the skin every 3 (three) days. Qty: 10 patch, Refills: 0    vancomycin 500 mg in sodium chloride 0.9 % 100 mL Inject 500 mg into the vein every 12 (twelve) hours. Qty: 41000 mg, Refills: 0      CONTINUE these medications which have CHANGED  Details  baclofen (LIORESAL) 10 MG tablet Take 0.5 tablets (5 mg total) by mouth 3 (three) times daily. Takes 10mg  in morning and lunchtime and takes 20mg  at bedtime Qty: 30 each, Refills: 0    insulin glargine (LANTUS) 100 UNIT/ML injection Inject 0.28 mLs (28 Units total) into the skin daily. Qty:  10 mL, Refills: 0      CONTINUE these medications which have NOT CHANGED   Details  albuterol (PROVENTIL) (2.5 MG/3ML) 0.083% nebulizer solution Take 3 mLs (2.5 mg total) by nebulization every 4 (four) hours as needed for wheezing or shortness of breath. Qty: 75 mL, Refills: 3    ALPRAZolam (XANAX) 1 MG tablet Take 1 tablet (1 mg total) by mouth every 6 (six) hours as needed for anxiety. Qty: 120 tablet, Refills: 2    atorvastatin (LIPITOR) 20 MG tablet Take 20 mg by mouth daily. Refills: 0    collagenase (SANTYL) ointment Apply 1 application topically daily. Qty: 90 g, Refills: 5    dexlansoprazole (DEXILANT) 60 MG capsule Take 60 mg by mouth daily.    diclofenac sodium (VOLTAREN) 1 % GEL Apply 2 g topically daily as needed (for pain).    feeding supplement, GLUCERNA SHAKE, (GLUCERNA SHAKE) LIQD Take 237 mLs by mouth 3 (three) times daily between meals. Qty: 237 mL, Refills: 90    glucagon (GLUCAGON EMERGENCY) 1 MG injection Inject 1 mg into the vein once as needed. Qty: 1 each, Refills: 12    insulin aspart (NOVOLOG) 100 UNIT/ML injection Inject 5 Units into the skin 3 (three) times daily with meals. Qty: 10 mL, Refills: 2    loratadine (CLARITIN) 10 MG tablet Take 10 mg by mouth daily.     metoCLOPramide (REGLAN) 5 MG tablet Take 1 tablet (5 mg total) by mouth 2 (two) times a week. And as needed for nausea Qty: 8 tablet, Refills: 3    ondansetron (ZOFRAN) 8 MG tablet Take 8 mg by mouth 2 (two) times daily as needed for nausea or vomiting.  Refills: 5    polyethylene glycol powder (GLYCOLAX/MIRALAX) powder Take 17 g by mouth daily. Qty: 850 g, Refills: 5    pregabalin (LYRICA) 100 MG capsule Take 100-200 mg by mouth 4 (four) times daily. Take 100 mg by mouth in the morning, take 100 mg by mouth in the afternoon, take 100 mg by mouth in the evening and take 200 mg by mouth at bedtime.    Amino Acids-Protein Hydrolys (FEEDING SUPPLEMENT, PRO-STAT SUGAR FREE 64,) LIQD Take  30 mLs by mouth daily. Qty: 900 mL, Refills: 0    bisacodyl (DULCOLAX) 5 MG EC tablet Take 1 tablet (5 mg total) by mouth 2 (two) times daily as needed for moderate constipation. Qty: 60 tablet, Refills: 5    glucose blood (ACCU-CHEK AVIVA PLUS) test strip Use to check blood sugar 4 times per day Qty: 400 each, Refills: 2    mupirocin cream (BACTROBAN) 2 % Apply 1 application topically 2 (two) times daily. Qty: 30 g, Refills: 1      STOP taking these medications     amLODipine (NORVASC) 2.5 MG tablet      diphenoxylate-atropine (LOMOTIL) 2.5-0.025 MG per tablet      fluconazole (DIFLUCAN) 200 MG tablet      furosemide (LASIX) 40 MG tablet      methocarbamol (ROBAXIN) 750 MG tablet      oxyCODONE (ROXICODONE) 15 MG immediate release tablet      ciprofloxacin (CIPRO) 500 MG tablet  Allergies  Allergen Reactions  . Cefuroxime Axetil Anaphylaxis  . Penicillins Anaphylaxis and Other (See Comments)    ?can take amoxicillin?  [name] [Benzonatate] Anaphylaxis  . Shellfish Allergy Itching and Other (See Comments)    Took benadryl to alleviate reaction   Follow-up Information    Follow up with [facility]              On 10/18/2014.   Why:  12:45 may want to get there earlier for paper work   Contact information:   [place] [place] [address] [phone]      Follow up with [facility].   Why:  HHRN for wound care, ostomy care and ostomy supplies   Contact information:   [place] [address] [place] [address] [phone]       Follow up with [facility].   Why:  supplies iv antibiotic for [facility]   Contact information:   [address] [place] [address] [phone]       Follow up with [name] A, MD In 1 week.   Specialty:  Family Medicine   Contact information:   [place] [address] [phone]         The results of significant diagnostics from this hospitalization (including imaging, microbiology, ancillary and laboratory) are listed below for reference.    Significant Diagnostic Studies: Mr Pelvis W Wo Contrast  10/03/2014   CLINICAL DATA:  Quadriplegic patient with methicillin sensitive bacteremia and decubitus ulcers over the ischial tuberosities.  EXAM: MRI PELVIS WITHOUT AND WITH CONTRAST  TECHNIQUE: Multiplanar multisequence MR imaging of the pelvis was performed both before and after administration of intravenous contrast.  CONTRAST:  12 mL MULTIHANCE GADOBENATE DIMEGLUMINE 529 MG/ML IV SOLN  COMPARISON:  MRI of the pelvis with and without contrast 09/16/2014.  FINDINGS: As on the prior study, the patient has decubitus ulcers over the ischial tuberosities, larger on the right. The decubitus ulcer on the right extends to bone. There is marrow edema and enhancement in these ischial tuberosity and right inferior pubic ramus consistent with osteomyelitis. The extent of marrow signal change appears slightly increased compared to the prior exam. There is likely cortical destructive change in the ischial tuberosity. No rim enhancing fluid collection is seen in this location although there is intense underlying edema and enhancement in the gluteal musculature consistent with myositis. A rind of soft tissue about the wound does not enhance consistent with necrosis.  Marrow edema in the left ischial tuberosity is normal without evidence of osteomyelitis. As seen on the right, there is an edema and enhancement in the gluteal musculature about the wound consistent with myositis. A rind of soft tissue about the wound which does not enhance compatible with necrosis.  Diffuse subcutaneous edema is seen about the pelvis. There is no hip joint effusion. No evidence of bursitis is identified.  IMPRESSION: Large decubitus ulcer over the right distal tuberosity with edema and enhancement in the right inferior  pubic ramus consistent with osteomyelitis. No abscess is identified although a rind of necrotic tissue surrounds the wound. Edema and enhancement in the gluteal musculature surrounding the wound is consistent with myositis  Smaller decubitus ulcer over the left distal tuberosity without underlying abscess or osteomyelitis. Rind of nonenhancing tissue in the wound is consistent with necrosis.   Electronically Signed   By: Inge Rise M.D.   On: 10/03/2014 13:07  Dg Chest Port 1 View  10/10/2014   CLINICAL DATA:  Pt has fever, chills, and altered mental status. Hx asthma and pneumonia  EXAM: PORTABLE CHEST - 1 VIEW  COMPARISON:  09/29/2014  FINDINGS: Right arm PICC has been placed to the cavoatrial junction. Patchy infiltrate or atelectasis in the inferior lingula, obscuring a portion of the left heart border. Right lung clear. No effusion. Old healed right clavicle fracture.  IMPRESSION: 1. PICC line to the cavoatrial junction. 2. New lingular infiltrate or atelectasis.   Electronically Signed   By: Arne Cleveland M.D.   On: 10/10/2014 12:26   Dg Chest Port 1 View  09/29/2014   CLINICAL DATA:  Shortness of Breath  EXAM: PORTABLE CHEST - 1 VIEW  COMPARISON:  September 24, 2014  FINDINGS: There is persistent left lower lobe consolidation. Right lung is now clear. Heart size and pulmonary vascularity are normal. No adenopathy. No bone lesions.  IMPRESSION: Persistent left lower lobe consolidation. Right lung clear. Lungs elsewhere clear. No change in cardiac silhouette.   Electronically Signed   By: Lowella Grip M.D.   On: 09/29/2014 12:44      Microbiology: Recent Results (from the past 240 hour(s))  Clostridium Difficile by PCR     Status: None   Collection Time: 10/06/14  2:58 PM  Result Value Ref Range Status   C difficile by pcr NEGATIVE NEGATIVE Final  Culture, Urine     Status: None   Collection Time: 10/11/14  9:00 AM  Result Value Ref Range Status   Specimen Description URINE,  RANDOM  Final   Special Requests NONE  Final   Colony Count NO GROWTH Performed at [facility]   Final   Culture NO GROWTH Performed at [facility]   Final   Report Status 10/12/2014 FINAL  Final  Surgical pcr screen     Status: None   Collection Time: 10/12/14  2:38 AM  Result Value Ref Range Status   MRSA, PCR NEGATIVE NEGATIVE Final   Staphylococcus aureus NEGATIVE NEGATIVE Final    Comment:        The Xpert SA Assay (FDA approved for NASAL specimens in patients over 43 years of age), is one component of a comprehensive surveillance program.  Test performance has been validated by [employer] for patients greater than or equal to 77 year old. It is not intended to diagnose infection nor to guide or monitor treatment.   Urine culture     Status: None   Collection Time: 10/12/14 12:41 PM  Result Value Ref Range Status   Specimen Description URINE, RANDOM CYSTOSCOPE  Final   Special Requests NONE  Final   Colony Count NO GROWTH Performed at [facility]   Final   Culture NO GROWTH Performed at [facility]   Final   Report Status 10/13/2014 FINAL  Final     Labs: Basic Metabolic Panel:  Recent Labs Lab 10/09/14 1125 10/10/14 0605 10/11/14 0510 10/12/14 0544 10/13/14 0555  NA 135 131* 133* 135 134*  K 4.3 4.8 4.7 4.0 4.1  CL 102 99 99 98 99  CO2 22 28 31 30 25   GLUCOSE 75 121* 266* 81 253*  BUN 8 8 12 9 8   CREATININE 0.54 0.67 0.78 0.69 0.72  CALCIUM 9.4 9.2 9.5 10.0 9.5   CBC:  Recent Labs Lab 10/08/14 0515 10/11/14 0510 10/12/14 0544 10/13/14 0555 10/14/14 0520  WBC 11.6* 16.6* 13.7* 17.3* 12.7*  HGB 8.0* 8.2*  8.3* 8.0* 7.8*  HCT 25.2* 26.5* 26.3* 25.5* 25.3*  MCV 85.4 83.3 84.8 82.5 83.5  PLT 716* 651* 642* 647* 604*   BNP: BNP (last 3 results)  Recent Labs  01/29/14 2113  PROBNP 1343.0*   CBG:  Recent Labs Lab 10/13/14 1200 10/13/14 1722 10/13/14 2203 10/14/14 0807 10/14/14 1227  GLUCAP  175* 142* 182* 104* 215*    Signed:  York, Marianne L, PA-C  [facility] 10/14/2014, 4:00 PM   I have seen and evaluated this patient and agree with the PA note. Patient feeling better, pain meds adjusted. Patient to be discharged home after blood transfusion. Mom aware.

## 2014-10-14 NOTE — Progress Notes (Signed)
Patient ID: Jason [name], male   DOB: 01/05/1986, 29 y.o.   MRN: [id number] 2 Days Post-Op  Subjective: Pt is sleeping.  Mom says he's eating well with no issues.  Still having chronic back pain  Objective: Vital signs in last 24 hours: Temp:  [98.3 F (36.8 C)-99.1 F (37.3 C)] 99.1 F (37.3 C) (01/06 2205) Pulse Rate:  [87-89] 87 (01/06 2205) Resp:  [18] 18 (01/06 2205) BP: (104-109)/(66) 104/66 mmHg (01/06 2205) SpO2:  [100 %] 100 % (01/06 2205) Last BM Date: 10/11/14  Intake/Output from previous day: 01/06 0701 - 01/07 0700 In: 880 [P.O.:880] Out: 1725 [Urine:1725] Intake/Output this shift:    PE: Abd: soft, incisions c/d/i, ostomy is viable with some edema, but with flatus present in bag.  Sweat present as well  Lab Results:   Recent Labs  10/13/14 0555 10/14/14 0520  WBC 17.3* 12.7*  HGB 8.0* 7.8*  HCT 25.5* 25.3*  PLT 647* 604*   BMET  Recent Labs  10/12/14 0544 10/13/14 0555  NA 135 134*  K 4.0 4.1  CL 98 99  CO2 30 25  GLUCOSE 81 253*  BUN 9 8  CREATININE 0.69 0.72  CALCIUM 10.0 9.5   PT/INR No results for input(s): LABPROT, INR in the last 72 hours. CMP     Component Value Date/Time   NA 134* 10/13/2014 0555   K 4.1 10/13/2014 0555   CL 99 10/13/2014 0555   CO2 25 10/13/2014 0555   GLUCOSE 253* 10/13/2014 0555   BUN 8 10/13/2014 0555   CREATININE 0.72 10/13/2014 0555   CALCIUM 9.5 10/13/2014 0555   PROT 6.8 10/05/2014 0500   ALBUMIN 2.1* 10/05/2014 0500   AST 12 10/05/2014 0500   ALT 8 10/05/2014 0500   ALKPHOS 87 10/05/2014 0500   BILITOT 0.2* 10/05/2014 0500   GFRNONAA >90 10/13/2014 0555   GFRAA >90 10/13/2014 0555   Lipase     Component Value Date/Time   LIPASE 8* 08/12/2014 1840       Studies/Results: No results found.  Anti-infectives: Anti-infectives    Start     Dose/Rate Route Frequency Ordered Stop   10/14/14 0000  vancomycin 500 mg in sodium chloride 0.9 % 100 mL     500 mg100 mL/hr over 60 Minutes  Intravenous Every 12 hours 10/13/14 1438 11/24/14 2359   10/13/14 2300  vancomycin (VANCOCIN) IVPB 750 mg/150 ml premix     750 mg150 mL/hr over 60 Minutes Intravenous Every 12 hours 10/13/14 2203     10/12/14 1215  ciprofloxacin (CIPRO) IVPB 400 mg     400 mg200 mL/hr over 60 Minutes Intravenous To Surgery 10/12/14 1203 10/12/14 1210   10/10/14 1930  levofloxacin (LEVAQUIN) tablet 500 mg  Status:  Discontinued     500 mg Oral Daily 10/10/14 1821 10/13/14 1648   10/08/14 1000  vancomycin (VANCOCIN) 500 mg in sodium chloride 0.9 % 100 mL IVPB  Status:  Discontinued     500 mg100 mL/hr over 60 Minutes Intravenous Every 12 hours 10/07/14 2225 10/13/14 2203   09/30/14 1400  vancomycin (VANCOCIN) IVPB 1000 mg/200 mL premix  Status:  Discontinued     1,000 mg200 mL/hr over 60 Minutes Intravenous Every 24 hours 09/29/14 1326 09/29/14 1848   09/30/14 1400  vancomycin (VANCOCIN) IVPB 1000 mg/200 mL premix  Status:  Discontinued     1,000 mg200 mL/hr over 60 Minutes Intravenous Every 24 hours 09/29/14 1916 09/30/14 0845   09/30/14 1000  vancomycin (VANCOCIN) IVPB  750 mg/150 ml premix  Status:  Discontinued     750 mg150 mL/hr over 60 Minutes Intravenous Every 12 hours 09/30/14 0845 10/07/14 2225   09/29/14 2200  aztreonam (AZACTAM) 1 g in dextrose 5 % 50 mL IVPB  Status:  Discontinued     1 g100 mL/hr over 30 Minutes Intravenous 3 times per day 09/29/14 1326 09/29/14 1848   09/29/14 2200  aztreonam (AZACTAM) 1 g in dextrose 5 % 50 mL IVPB  Status:  Discontinued     1 g100 mL/hr over 30 Minutes Intravenous 3 times per day 09/29/14 1916 10/01/14 1426   09/29/14 1300  aztreonam (AZACTAM) 2 g in dextrose 5 % 50 mL IVPB     2 g100 mL/hr over 30 Minutes Intravenous STAT 09/29/14 1239 09/29/14 1355   09/29/14 1245  vancomycin (VANCOCIN) IVPB 1000 mg/200 mL premix     1,000 mg200 mL/hr over 60 Minutes Intravenous  Once 09/29/14 1239 09/29/14 1530       Assessment/Plan  1. POD 2, s/p lap diverting loop  descending colostomy 2. Multiple decubiti  3. 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Plan: 1. Patient doing well surgically.  Tolerating solid diet.  Await feculent output from ostomy, but otherwise doing well.   2. VAC on wounds per [place] 3. He will need follow up at the wound center for these chronic wounds.   LOS: 15 days    Caliann Leckrone E 10/14/2014, 9:18 AM Pager: [phone]

## 2014-10-14 NOTE — Clinical Documentation Improvement (Signed)
MD's, NP's, and PA's    Per OP note debridement was performed for accuracy of the record please clarify type of debridement.  Thank you   ? Excisional vs. nonexcisional?  Thank You,  Ree Kida ,RN Clinical Documentation Specialist:  [phone]  [facility] Information Management

## 2014-10-14 NOTE — Consult Note (Signed)
WOC ostomy follow up Per Hospitalist notes patient for discharge home today.  Still no output via stoma except bloody drainage and mucous. + flatus no other fecal material yet.  Stoma type/location: LLQ, loop colostomy with red rubber catheter under stoma for support  Stomal assessment/size: 2" round, nicely budded, pink, moist  Peristomal assessment: intact, note red robinson sutured per surgeon notes, must manipulate the stoma a bit to see the catheter.  Treatment options for stomal/peristomal skin: none needed  Output see above  Ostomy pouching: 2pc. 2 3/4" flat, drainable  Education provided:  Met with patient and his mother and reviewed and demonstrated all aspects of pouch change with mother.  She practiced attaching 2pc pouch and emptying with lock and roll closure.  I demonstrated 1pc closed end pouch to them as well as I think Mumin's desire will be to use closed end pouches if possible.  Demonstrated cleaning the spout of the pouch after emptying. Mother has educational materials she has reviewed and the patient and mother ask appropriate questions today.  Marked [facility] catelog for mother and reviewed that today with her. She has already contacted their DME provider and will need script at DC for ostomy supplies.  Pt sent home with 3 2pc pouching systems and several samples of closed end pouches for use at home until [facility] and DME can be arranged.  Enrolled patient in [facility] Discharge program: Yes  Note: VAC intact to the sacrum that was placed yesterday.  CM aware and attempting to obtain home unit for patient.  Bedside nursing staff to connect to home unit for DC.  HHRN will need to add NPWT dressings to the right ischial wound with the next dressing change on Friday 10/15/14.   Zaydon Kinser Liane Comber RN,CWOCN [phone]

## 2014-10-14 NOTE — Progress Notes (Signed)
Patient discharge teaching given, including activity, diet, follow-up appoints, and medications. Patient verbalized understanding of all discharge instructions. PICC line flushed and saline locked by IV team. Vitals are stable. Skin is intact except as charted in most recent assessments. Portable wound vac applied. [facility] called for transport. Pt to be escorted out by EMT, to be driven home by EMT via ambulance.

## 2014-10-14 NOTE — Progress Notes (Signed)
Pt left unit via stretcher with [facility]. In good condition.

## 2014-10-15 ENCOUNTER — Telehealth: Payer: Self-pay | Admitting: Family Medicine

## 2014-10-15 LAB — TYPE AND SCREEN
ABO/RH(D): O POS
ANTIBODY SCREEN: NEGATIVE
Unit division: 0

## 2014-10-15 MED ORDER — FENTANYL 12 MCG/HR TD PT72: 12.5000 ug | MEDICATED_PATCH | TRANSDERMAL | Status: DC

## 2014-10-15 MED ORDER — BACLOFEN 10 MG PO TABS: 5.0000 mg | ORAL_TABLET | Freq: Three times a day (TID) | ORAL | Status: DC

## 2014-10-15 NOTE — Telephone Encounter (Signed)
Per Dr. Sarajane Jews okay to give the verbal order. I did call and leave a voice message for Olin Hauser with the verbal order to resume care.

## 2014-10-15 NOTE — Telephone Encounter (Signed)
Jason [name] [facility] needs verbal order to resume pt care since pt was last  In hospital. Ok to leave on vm

## 2014-10-18 ENCOUNTER — Encounter (HOSPITAL_BASED_OUTPATIENT_CLINIC_OR_DEPARTMENT_OTHER): Payer: Medicaid Other | Attending: Plastic Surgery

## 2014-10-18 DIAGNOSIS — L97423 Non-pressure chronic ulcer of left heel and midfoot with necrosis of muscle: Secondary | ICD-10-CM | POA: Insufficient documentation

## 2014-10-18 DIAGNOSIS — L98493 Non-pressure chronic ulcer of skin of other sites with necrosis of muscle: Secondary | ICD-10-CM | POA: Insufficient documentation

## 2014-10-18 DIAGNOSIS — L98413 Non-pressure chronic ulcer of buttock with necrosis of muscle: Secondary | ICD-10-CM | POA: Diagnosis not present

## 2014-10-19 NOTE — Consult Note (Signed)
NAME:  Jason, [name]                  ACCOUNT NO.:  MEDICAL RECORD NO.:  [id number]  LOCATION:                                 FACILITY:  PHYSICIAN:  Irene Limbo, MD   DATE OF BIRTH:  [birth year]  DATE OF CONSULTATION:  10/18/2014                                CONSULTATION   CHIEF COMPLAINT:  Multiple pressure ulcerations of buttock and non-pressure ulcerations of hand and foot.  HISTORY OF PRESENT ILLNESS:  The patient is a 29 year old, African American male, who is accompanied by his mother, who is his primary caretaker.  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  The underlying cause for his spinal infarct was never fully determined and was thought possibly to be related to his diabetes mellitus.  During his recent hospitalization, the patient underwent surgical debridement of his ischial and sacral ulcers by General Surgery and suprapubic catheter was also placed as well as a diverting ostomy completed.  The patient was discharged with a wound VAC over his sacral and ischial ulcers.  He is accompanied by his mother, who notes that following surgical debridement, his wounds were all pink in nature; however, they have developed recurrent necrotic tissue.  With regards to his hand and foot ulcerations, they report that these were not related to pressure and developed blisters during his period of acute sepsis.  There is no current wound care for these.  Prior to his recent hospitalization, the patient states that he was spending approximately 95% of his day in his wheelchair.  He was discharged with a low air loss mattress for home use.  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  During his hospital stay, he  was receiving hydrotherapy.  The patient is a prior wound center patient prior to his previous admission.  PAST MEDICAL HISTORY:  As per HPI and also includes gastroesophageal reflux, diabetic neuropathy, history of seizures, history of pneumonia, and fibromyalgia.  PAST SURGICAL HISTORY:  As per HPI.  He has also undergone multiple tooth extractions and tonsillectomy.  SOCIAL HISTORY:  The patient has had recent use of e-cigarettes and cigars and none since his recent hospitalization.  LABORATORY DATA:  Review of the chart indicates CBC dated October 13, 2014, with hemoglobin of 8 and platelets of 647, and white blood cell count was 17.3.  Basic metabolic panel was significant for blood glucose of 253.  There is no pre-albumin noted during his hospital stay.  There is also no hemoglobin A1c available for review.  He is being followed by an infectious disease and was discharged with IV vancomycin.  PHYSICAL EXAMINATION:  Vital Signs:  Weight is 147 pounds, height is 5 feet 8 inches, blood pressure is 94/68, pulse 79, and temperature is 98.2.  Over his left posterior heel, there is a stage II ulceration measured as 2.5 x 2 x 0.1.  The wound is pink and completely granulated and no  eschar is present over his left anterior foot.  The patient is asensate in the area and tangential debridement was performed with knife until viable tissue was found.  Total wound size was measured as 5.5 x 4.5 x 0.1.  Following debridement, there was exposure of extensor tendon in one area a few mm in size.  The patient has no movement of his toes at baseline.  Over the left hand second digit, there is open wound that measured as 0.7 x 0.8 x 0.1 cm and over the left third digit there was open wound measured as 1.5 x 2 x 0.1 cm.  Eschar was present over both of these wounds and this was excised with scissors over his left third digit exposure of the extensor tendon at the level of the DIP joint was noted.   There appears to be paratenon in place.  We will institute collagen dressings for both of the digit ulcerations as well as his foot ulceration.  He does has exposure of tendons at both locations; however, the patient has no movement at either of these locations.  Over his sacrum, he has an open wound which measured 7 x 4.7 x 2.0.  Over his right ischial ulcer, he has an open wound that measured 9.2 x 6.9 x 3.0 cm.  Both wounds have evidence of necrotic tissue at its base as well as the surrounding skin margins over the ischial ulcer. After application of topical anesthetic, knife was used to sharply excise the necrotic skin, subcutaneous tissue, and fascia for total area of 20 cm2 involving both of these wounds.  Hemostasis was obtained with pressure.  PLAN:  We will plan to continue the wound VAC over both of these areas with a bridge dressing and counsel the patient and his mother that there were areas of further necrosis present.  In the setting of recent surgical debridement, this likely represents continued pressure in the area, and we reviewed the importance of offloading, and that he must remain in his bed for the majority of the day, he may sit on his chair for meals.  We will plan for a followup in 2 weeks time, and we will obtain hemoglobin A1c as well as a pre-albumin.          ______________________________ Irene Limbo, MD MBA     BT/MEDQ  D:  10/18/2014  T:  10/19/2014  Job:  [id number]

## 2014-10-22 ENCOUNTER — Encounter: Payer: Self-pay | Admitting: Family Medicine

## 2014-10-22 ENCOUNTER — Ambulatory Visit (INDEPENDENT_AMBULATORY_CARE_PROVIDER_SITE_OTHER): Payer: Medicaid Other | Admitting: Family Medicine

## 2014-10-22 ENCOUNTER — Ambulatory Visit: Payer: Self-pay | Admitting: Family Medicine

## 2014-10-22 VITALS — BP 91/60 | HR 74 | Temp 98.1°F

## 2014-10-22 DIAGNOSIS — A4101 Sepsis due to Methicillin susceptible Staphylococcus aureus: Secondary | ICD-10-CM

## 2014-10-22 DIAGNOSIS — M869 Osteomyelitis, unspecified: Secondary | ICD-10-CM

## 2014-10-22 DIAGNOSIS — Z794 Long term (current) use of insulin: Secondary | ICD-10-CM

## 2014-10-22 DIAGNOSIS — E119 Type 2 diabetes mellitus without complications: Secondary | ICD-10-CM

## 2014-10-22 DIAGNOSIS — L8994 Pressure ulcer of unspecified site, stage 4: Secondary | ICD-10-CM

## 2014-10-22 DIAGNOSIS — IMO0001 Reserved for inherently not codable concepts without codable children: Secondary | ICD-10-CM

## 2014-10-22 DIAGNOSIS — D649 Anemia, unspecified: Secondary | ICD-10-CM

## 2014-10-22 LAB — CBC WITH DIFFERENTIAL/PLATELET
Basophils Absolute: 0 10*3/uL (ref 0.0–0.1)
Basophils Relative: 0.2 % (ref 0.0–3.0)
Eosinophils Absolute: 0.6 10*3/uL (ref 0.0–0.7)
Eosinophils Relative: 3.4 % (ref 0.0–5.0)
HEMATOCRIT: 28.6 % — AB (ref 39.0–52.0)
Hemoglobin: 9.1 g/dL — ABNORMAL LOW (ref 13.0–17.0)
LYMPHS ABS: 3.5 10*3/uL (ref 0.7–4.0)
Lymphocytes Relative: 19.9 % (ref 12.0–46.0)
MCHC: 31.9 g/dL (ref 30.0–36.0)
MCV: 83.6 fl (ref 78.0–100.0)
MONOS PCT: 10.2 % (ref 3.0–12.0)
Monocytes Absolute: 1.8 10*3/uL — ABNORMAL HIGH (ref 0.1–1.0)
NEUTROS PCT: 66.3 % (ref 43.0–77.0)
Neutro Abs: 11.7 10*3/uL — ABNORMAL HIGH (ref 1.4–7.7)
Platelets: 562 10*3/uL — ABNORMAL HIGH (ref 150.0–400.0)
RBC: 3.42 Mil/uL — ABNORMAL LOW (ref 4.22–5.81)
RDW: 16.4 % — ABNORMAL HIGH (ref 11.5–15.5)
WBC: 17.6 10*3/uL — ABNORMAL HIGH (ref 4.0–10.5)

## 2014-10-22 LAB — BASIC METABOLIC PANEL
BUN: 8 mg/dL (ref 6–23)
CHLORIDE: 109 meq/L (ref 96–112)
CO2: 24 meq/L (ref 19–32)
CREATININE: 0.62 mg/dL (ref 0.40–1.50)
Calcium: 9.6 mg/dL (ref 8.4–10.5)
GFR: 195.51 mL/min (ref >60.00)
Glucose, Bld: 119 mg/dL — ABNORMAL HIGH (ref 70–99)
Potassium: 3.1 mEq/L — ABNORMAL LOW (ref 3.5–5.1)
SODIUM: 137 meq/L (ref 135–145)

## 2014-10-22 MED ORDER — FENTANYL 25 MCG/HR TD PT72: 25.0000 ug | MEDICATED_PATCH | TRANSDERMAL | Status: DC

## 2014-10-22 NOTE — Progress Notes (Signed)
   Subjective:    Patient ID: Jason [name], male    DOB: [birth year], 29 y.o.   MRN: [id number]  HPI Here with bot parents to follow up a hospital stay from 09-29-14 to 10-14-14 for non-MRSA sepsis originating from an ischial decubitus and pelvic osteomyelitis. He has been managed by Infectious Disease, and currently Dr. Wendie Agreste is managing his Vancomycin dosing through a PICC line. He has not been febrile at all since his DC home. His appetite is good. He has been supplementing his diet with Glucerna shakes and Pro-Stat, but his insurance will no longer cover the Pro-Stat. The home health team suggested they use OTC whey protein as an alternative, and I think this is a good idea. He had a suprapubic catheter place and had a diverting colostomy done at the same time. The colostomy is functioning properly and his urine has appeared to be clear. He was given a 12.5 mcg Fentanyl patch in the hospital for pain, but this only helped for the first day. They are asking if the dose on the patch can be increased. He will be seeing Urology sometime next week, and he is scheduled to see Dr. Naaman Plummer on 10-29-14. He has been anemic with HGb consistently in the 8 or 9 range, and he has received several transfusions of PRBC. His mother has thallassemia and her side of the family has a lot of thallasemia, so she wonders if [place] may have this too.    Review of Systems  Constitutional: Negative for fever, chills and diaphoresis.  Respiratory: Negative.   Cardiovascular: Negative.   Gastrointestinal: Negative.        Objective:   Physical Exam  Constitutional: He is oriented to person, place, and time.  In his wheelchair, alert   Cardiovascular: Normal rate, regular rhythm, normal heart sounds and intact distal pulses.   Pulmonary/Chest: Effort normal. No respiratory distress. He has no wheezes. He has no rales.  Lungs are clear except for soft dry crackles at both bases   Abdominal: Soft. Bowel sounds are  normal. He exhibits no distension and no mass. There is no tenderness. There is no rebound and no guarding.  Musculoskeletal: He exhibits no edema.  Neurological: He is alert and oriented to person, place, and time.          Assessment & Plan:  He seems to be stable at this point. Get a CBC and BMET today. I reminded him to use his incentive spirometer 4 times daily. We increased the dose of his Fentanyl patch to 25 mcg. 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

## 2014-10-22 NOTE — Progress Notes (Signed)
Pre visit review using our clinic review tool, if applicable. No additional management support is needed unless otherwise documented below in the visit note. 

## 2014-10-25 MED ORDER — POTASSIUM CHLORIDE CRYS ER 20 MEQ PO TBCR: 20.0000 meq | EXTENDED_RELEASE_TABLET | Freq: Every day | ORAL | Status: DC

## 2014-10-25 NOTE — Addendum Note (Signed)
Addended by: Aggie Hacker A on: 10/25/2014 05:16 PM   Modules accepted: Orders

## 2014-10-29 ENCOUNTER — Encounter: Payer: Medicaid Other | Admitting: Physical Medicine & Rehabilitation

## 2014-11-02 ENCOUNTER — Ambulatory Visit: Payer: Medicaid Other | Admitting: Physical Medicine & Rehabilitation

## 2014-11-04 ENCOUNTER — Telehealth: Payer: Self-pay | Admitting: Licensed Clinical Social Worker

## 2014-11-04 NOTE — Telephone Encounter (Signed)
That is reasonable.

## 2014-11-04 NOTE — Telephone Encounter (Signed)
Patient had a Vanc trough level of 26.8, labs will be rechecked again tomorrow. Scenario is that the patient  infused his antibiotics around 1:30 am yesterday, and RN came out to draw blood around 9:30. Labs and infusion was too close together and pharmacist believes this is why the trough is high.

## 2014-11-05 ENCOUNTER — Encounter: Payer: Medicaid Other | Attending: Registered Nurse | Admitting: Registered Nurse

## 2014-11-05 ENCOUNTER — Encounter: Payer: Self-pay | Admitting: Registered Nurse

## 2014-11-05 ENCOUNTER — Telehealth: Payer: Self-pay | Admitting: Family Medicine

## 2014-11-05 VITALS — BP 114/72 | HR 72 | Resp 14

## 2014-11-05 DIAGNOSIS — G9511 Acute infarction of spinal cord (embolic) (nonembolic): Secondary | ICD-10-CM

## 2014-11-05 DIAGNOSIS — G9519 Other vascular myelopathies: Secondary | ICD-10-CM | POA: Diagnosis present

## 2014-11-05 DIAGNOSIS — N319 Neuromuscular dysfunction of bladder, unspecified: Secondary | ICD-10-CM | POA: Diagnosis present

## 2014-11-05 DIAGNOSIS — N390 Urinary tract infection, site not specified: Secondary | ICD-10-CM | POA: Insufficient documentation

## 2014-11-05 DIAGNOSIS — E1065 Type 1 diabetes mellitus with hyperglycemia: Secondary | ICD-10-CM

## 2014-11-05 DIAGNOSIS — Z5181 Encounter for therapeutic drug level monitoring: Secondary | ICD-10-CM | POA: Diagnosis present

## 2014-11-05 DIAGNOSIS — IMO0002 Reserved for concepts with insufficient information to code with codable children: Secondary | ICD-10-CM

## 2014-11-05 DIAGNOSIS — E1041 Type 1 diabetes mellitus with diabetic mononeuropathy: Secondary | ICD-10-CM | POA: Insufficient documentation

## 2014-11-05 DIAGNOSIS — E1140 Type 2 diabetes mellitus with diabetic neuropathy, unspecified: Secondary | ICD-10-CM | POA: Insufficient documentation

## 2014-11-05 DIAGNOSIS — E1040 Type 1 diabetes mellitus with diabetic neuropathy, unspecified: Secondary | ICD-10-CM

## 2014-11-05 DIAGNOSIS — L89159 Pressure ulcer of sacral region, unspecified stage: Secondary | ICD-10-CM

## 2014-11-05 DIAGNOSIS — Z79899 Other long term (current) drug therapy: Secondary | ICD-10-CM | POA: Diagnosis present

## 2014-11-05 DIAGNOSIS — G8250 Quadriplegia, unspecified: Secondary | ICD-10-CM | POA: Insufficient documentation

## 2014-11-05 MED ORDER — FENTANYL 12 MCG/HR TD PT72: 12.5000 ug | MEDICATED_PATCH | TRANSDERMAL | Status: DC

## 2014-11-05 MED ORDER — OXYCODONE-ACETAMINOPHEN 10-325 MG PO TABS: 1.0000 | ORAL_TABLET | Freq: Three times a day (TID) | ORAL | Status: DC | PRN

## 2014-11-05 NOTE — Telephone Encounter (Signed)
I left a voice message with mom and Jason [name] with [name], just checking on pt to see how blood glucose is?

## 2014-11-05 NOTE — Progress Notes (Signed)
Subjective:    Patient ID: Jason [name], male    DOB: [birth year], 29 y.o.   MRN: [id number]  HPI: [name] is a 29 year old male who returns for chronic pain and medication refill. He says his pain is located in his lower back and buttocks he has a sacral decubitus. Has a wound vac. He follows with the wound center and receiving home care nurses for  Dressing changes.Infectious diseases following.  Mother states he has a right thigh wound. He rates his pain 9. He was hospitalized in [place] in December and discharged in January 2016. On 10/12/2014 he had insertion of supra pubic catheter and Laparoscopic diverting colostomy for sacral decubitus ulcer with osteomyelitis. He states he's in excruciating pain, while in the hospital they discontinued his oxycodone and placed him on Fentanyl . He says the the fentanyl patches aren't working. He seen Dr. Sharlene Motts increased his Fentanyly to 25 mcg. Educated on the Narcotic contract and all his pain medications are  prescribed through this office. He and his mother verbalizes understanding. Was in contact with Dr. Naaman Plummer we will prescribed his oxycodone and weaned his Fentanyl. He verbalizes understanding.   He's not following an exercise routine. His mother in the room. Arrived in wheelchair.  Pain Inventory Average Pain 8 Pain Right Now 9 My pain is constant, sharp and aching  In the last 24 hours, has pain interfered with the following? General activity 0 Relation with others 0 Enjoyment of life 0 What TIME of day is your pain at its worst? evening, night Sleep (in general) Fair  Pain is worse with: sitting Pain improves with: heat/ice and medication Relief from Meds: 8  Mobility use a wheelchair needs help with transfers Function disabled: date disabled 01/29/2014 I need assistance with the following:  feeding, dressing, bathing, toileting, meal prep, household duties and shopping  Neuro/Psych bladder control  problems bowel control problems weakness numbness tremor spasms anxiety  Prior Studies Any changes since last visit?  yes bone scan x-rays CT/MRI  Physicians involved in your care Any changes since last visit?  no   Family History  Problem Relation Age of Onset  . Diabetes Father   . Hypertension Father   . Asthma      fhx  . Hypertension      fhx  . Stroke      fhx  . Heart disease Mother    History   Social History  . Marital Status: Single    Spouse Name: N/A    Number of Children: 0  . Years of Education: 11   Occupational History  . Disability    Social History Main Topics  . Smoking status: Current Some Day Smoker    Types: Cigars, Cigarettes    Last Attempt to Quit: 01/29/2014  . Smokeless tobacco: Never Used     Comment: e-cigarette and cigars currently  . Alcohol Use: No     Comment: rarely  . Drug Use: No  . Sexual Activity: None   Other Topics Concern  . None   Social History Narrative   Patient lives at home with mother father.    Patient has 2 children.    Patient is single.    Patient was left hand but after stroke patient is now right handed.    Patient has  11th grade education.    Past Surgical History  Procedure Laterality Date  . Tonsillectomy    . Multiple extractions with alveoloplasty  N/A 08/03/2014    Procedure: MULTIPLE EXTRACTIONS;  Surgeon: Gae Bon, DDS;  Location: [facility];  Service: Oral Surgery;  Laterality: N/A;  . Tee without cardioversion N/A 08/17/2014    Procedure: TRANSESOPHAGEAL ECHOCARDIOGRAM (TEE);  Surgeon: Dorothy Spark, MD;  Location: [facility] ENDOSCOPY;  Service: Cardiovascular;  Laterality: N/A;  . Debridment of decubitus ulcer N/A 10/04/2014    Procedure: DEBRIDMENT OF DECUBITUS ULCER;  Surgeon: Georganna Skeans, MD;  Location: [facility];  Service: General;  Laterality: N/A;  . Laparoscopic diverted colostomy N/A 10/12/2014    Procedure: LAPAROSCOPIC DIVERTING COLOSTOMY;  Surgeon: Donnie Mesa, MD;   Location: [facility];  Service: General;  Laterality: N/A;  . Insertion of suprapubic catheter N/A 10/12/2014    Procedure: INSERTION OF SUPRAPUBIC CATHETER;  Surgeon: Reece Packer, MD;  Location: [facility];  Service: Urology;  Laterality: N/A;   Past Medical History  Diagnosis Date  . GERD (gastroesophageal reflux disease)   . Asthma   . Hx MRSA infection     on face  . Gastroparesis   . Diabetic neuropathy   . Seizures   . Stroke     spinal stroke in 4/15  . Diabetes mellitus     sees Dr. Loanne Drilling   . Family history of anesthesia complication     Pt mother can't have epidural procedures  . Dysrhythmia   . Pneumonia   . Arthritis   . Fibromyalgia    BP 114/72 mmHg  Pulse 72  Resp 14  SpO2 99%  Opioid Risk Score:   Fall Risk Score: Moderate Fall Risk (6-13 points) Review of Systems  Gastrointestinal:       Bowel control problems  Endocrine:       High blood sugar  Genitourinary:       Bladder control problems  Skin: Positive for rash.  Neurological: Positive for tremors, weakness and numbness.       Spasms  Psychiatric/Behavioral: The patient is nervous/anxious.   All other systems reviewed and are negative.      Objective:   Physical Exam  Constitutional: He is oriented to person, place, and time. He appears well-developed and well-nourished.  HENT:  Head: Normocephalic and atraumatic.  Neck: Normal range of motion. Neck supple.  Cardiovascular: Normal rate and regular rhythm.   Pulmonary/Chest: Effort normal and breath sounds normal.  Musculoskeletal:  Waisting Muscle: Muscle Testing Reveals: Upper Extremities: Decreased ROM and Muscle Strength 0/5 Lumbar Paraspinal tenderness: L-3- L-5 Lower extremities: Paraplegic Arrived in wheelchair  Neurological: He is alert and oriented to person, place, and time.  Skin: Skin is warm and dry.  Psychiatric: He has a normal mood and affect.  Nursing note and vitals reviewed.         Assessment & Plan:  1.  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 Chronic Pain Management: RX: Oxycodone 10/325 mg one tablet every 8 hours as needed #90 and Fentanyl 31mcg one patch every three days. #10 ( weaning) Continue Lyrica, Voltaren gel, and Robaxin 3. Spinal cord infarct: Continue current medication regime 4. DM type 2 with gastroparesis: PCP Following 5. ChronicBLE wounds/ Sacral Decubitus: Infectious Disease Following/ Wound Care Following. S/ P Diverting Colostomy and Supra Pubic Catheter  6. Severe diabetic peripheral neuropathy: Continue Lyrica 7. Neurogenic bladder/urethral ulceration:  S/P Supra Pubic Catheter Urology Following  23minutes of face to face patient care time was spent during this visit. All questions were encouraged and answered.  F/U 1 month.

## 2014-11-05 NOTE — Telephone Encounter (Signed)
Nurse w/ [name] visited pt this am and his blood sugar was 40. They gave pt yogurt, OJ, ginger ale.  Pt has appt w/ pain clinic this am and went on to that. She was unable to draw his blood, and set up appt w/ [facility] for later this am  Olin Hauser states she is going to call and check on pt later on today.

## 2014-11-08 ENCOUNTER — Emergency Department (HOSPITAL_COMMUNITY): Payer: Medicaid Other

## 2014-11-08 ENCOUNTER — Telehealth: Payer: Self-pay | Admitting: Family Medicine

## 2014-11-08 ENCOUNTER — Encounter (HOSPITAL_COMMUNITY): Payer: Self-pay | Admitting: Emergency Medicine

## 2014-11-08 ENCOUNTER — Telehealth: Payer: Self-pay | Admitting: *Deleted

## 2014-11-08 ENCOUNTER — Inpatient Hospital Stay (HOSPITAL_COMMUNITY)
Admission: EM | Admit: 2014-11-08 | Discharge: 2014-11-19 | DRG: 853 | Disposition: A | Discharge status: Home / Self Care | Payer: Medicaid Other | Attending: Internal Medicine | Admitting: Internal Medicine

## 2014-11-08 ENCOUNTER — Encounter (HOSPITAL_BASED_OUTPATIENT_CLINIC_OR_DEPARTMENT_OTHER): Payer: Medicaid Other | Attending: Plastic Surgery

## 2014-11-08 DIAGNOSIS — L89154 Pressure ulcer of sacral region, stage 4: Secondary | ICD-10-CM | POA: Diagnosis present

## 2014-11-08 DIAGNOSIS — K3184 Gastroparesis: Secondary | ICD-10-CM | POA: Diagnosis present

## 2014-11-08 DIAGNOSIS — Z79899 Other long term (current) drug therapy: Secondary | ICD-10-CM

## 2014-11-08 DIAGNOSIS — IMO0002 Reserved for concepts with insufficient information to code with codable children: Secondary | ICD-10-CM | POA: Diagnosis present

## 2014-11-08 DIAGNOSIS — Z7951 Long term (current) use of inhaled steroids: Secondary | ICD-10-CM

## 2014-11-08 DIAGNOSIS — L89150 Pressure ulcer of sacral region, unstageable: Secondary | ICD-10-CM | POA: Diagnosis not present

## 2014-11-08 DIAGNOSIS — G8250 Quadriplegia, unspecified: Secondary | ICD-10-CM | POA: Diagnosis present

## 2014-11-08 DIAGNOSIS — Z833 Family history of diabetes mellitus: Secondary | ICD-10-CM

## 2014-11-08 DIAGNOSIS — M4628 Osteomyelitis of vertebra, sacral and sacrococcygeal region: Secondary | ICD-10-CM | POA: Diagnosis present

## 2014-11-08 DIAGNOSIS — L89623 Pressure ulcer of left heel, stage 3: Secondary | ICD-10-CM | POA: Diagnosis present

## 2014-11-08 DIAGNOSIS — E43 Unspecified severe protein-calorie malnutrition: Secondary | ICD-10-CM | POA: Diagnosis present

## 2014-11-08 DIAGNOSIS — E1041 Type 1 diabetes mellitus with diabetic mononeuropathy: Secondary | ICD-10-CM | POA: Diagnosis not present

## 2014-11-08 DIAGNOSIS — M869 Osteomyelitis, unspecified: Secondary | ICD-10-CM | POA: Diagnosis present

## 2014-11-08 DIAGNOSIS — Z8673 Personal history of transient ischemic attack (TIA), and cerebral infarction without residual deficits: Secondary | ICD-10-CM

## 2014-11-08 DIAGNOSIS — J690 Pneumonitis due to inhalation of food and vomit: Secondary | ICD-10-CM | POA: Diagnosis present

## 2014-11-08 DIAGNOSIS — E1143 Type 2 diabetes mellitus with diabetic autonomic (poly)neuropathy: Secondary | ICD-10-CM | POA: Diagnosis present

## 2014-11-08 DIAGNOSIS — A498 Other bacterial infections of unspecified site: Secondary | ICD-10-CM | POA: Insufficient documentation

## 2014-11-08 DIAGNOSIS — R05 Cough: Secondary | ICD-10-CM

## 2014-11-08 DIAGNOSIS — B961 Klebsiella pneumoniae [K. pneumoniae] as the cause of diseases classified elsewhere: Secondary | ICD-10-CM | POA: Diagnosis present

## 2014-11-08 DIAGNOSIS — N179 Acute kidney failure, unspecified: Secondary | ICD-10-CM | POA: Diagnosis present

## 2014-11-08 DIAGNOSIS — Z823 Family history of stroke: Secondary | ICD-10-CM

## 2014-11-08 DIAGNOSIS — E1065 Type 1 diabetes mellitus with hyperglycemia: Secondary | ICD-10-CM | POA: Diagnosis present

## 2014-11-08 DIAGNOSIS — L89894 Pressure ulcer of other site, stage 4: Secondary | ICD-10-CM | POA: Diagnosis present

## 2014-11-08 DIAGNOSIS — L89322 Pressure ulcer of left buttock, stage 2: Secondary | ICD-10-CM | POA: Diagnosis present

## 2014-11-08 DIAGNOSIS — G894 Chronic pain syndrome: Secondary | ICD-10-CM | POA: Diagnosis present

## 2014-11-08 DIAGNOSIS — J45909 Unspecified asthma, uncomplicated: Secondary | ICD-10-CM | POA: Diagnosis present

## 2014-11-08 DIAGNOSIS — E1043 Type 1 diabetes mellitus with diabetic autonomic (poly)neuropathy: Secondary | ICD-10-CM | POA: Diagnosis present

## 2014-11-08 DIAGNOSIS — R532 Functional quadriplegia: Secondary | ICD-10-CM | POA: Diagnosis present

## 2014-11-08 DIAGNOSIS — L89314 Pressure ulcer of right buttock, stage 4: Secondary | ICD-10-CM

## 2014-11-08 DIAGNOSIS — R7881 Bacteremia: Secondary | ICD-10-CM | POA: Insufficient documentation

## 2014-11-08 DIAGNOSIS — R059 Cough, unspecified: Secondary | ICD-10-CM

## 2014-11-08 DIAGNOSIS — A419 Sepsis, unspecified organism: Secondary | ICD-10-CM | POA: Diagnosis present

## 2014-11-08 DIAGNOSIS — N319 Neuromuscular dysfunction of bladder, unspecified: Secondary | ICD-10-CM | POA: Diagnosis present

## 2014-11-08 DIAGNOSIS — M009 Pyogenic arthritis, unspecified: Secondary | ICD-10-CM | POA: Insufficient documentation

## 2014-11-08 DIAGNOSIS — J90 Pleural effusion, not elsewhere classified: Secondary | ICD-10-CM | POA: Insufficient documentation

## 2014-11-08 DIAGNOSIS — L8990 Pressure ulcer of unspecified site, unspecified stage: Secondary | ICD-10-CM

## 2014-11-08 DIAGNOSIS — L89159 Pressure ulcer of sacral region, unspecified stage: Secondary | ICD-10-CM | POA: Insufficient documentation

## 2014-11-08 DIAGNOSIS — F17210 Nicotine dependence, cigarettes, uncomplicated: Secondary | ICD-10-CM | POA: Diagnosis present

## 2014-11-08 DIAGNOSIS — Z8614 Personal history of Methicillin resistant Staphylococcus aureus infection: Secondary | ICD-10-CM

## 2014-11-08 DIAGNOSIS — G9511 Acute infarction of spinal cord (embolic) (nonembolic): Secondary | ICD-10-CM | POA: Diagnosis present

## 2014-11-08 DIAGNOSIS — M797 Fibromyalgia: Secondary | ICD-10-CM | POA: Diagnosis present

## 2014-11-08 DIAGNOSIS — K219 Gastro-esophageal reflux disease without esophagitis: Secondary | ICD-10-CM | POA: Diagnosis present

## 2014-11-08 DIAGNOSIS — M60009 Infective myositis, unspecified site: Secondary | ICD-10-CM | POA: Diagnosis not present

## 2014-11-08 DIAGNOSIS — L8994 Pressure ulcer of unspecified site, stage 4: Secondary | ICD-10-CM | POA: Diagnosis present

## 2014-11-08 DIAGNOSIS — R6520 Severe sepsis without septic shock: Secondary | ICD-10-CM | POA: Diagnosis present

## 2014-11-08 DIAGNOSIS — N390 Urinary tract infection, site not specified: Secondary | ICD-10-CM | POA: Diagnosis present

## 2014-11-08 DIAGNOSIS — A4101 Sepsis due to Methicillin susceptible Staphylococcus aureus: Secondary | ICD-10-CM | POA: Diagnosis not present

## 2014-11-08 DIAGNOSIS — Z9359 Other cystostomy status: Secondary | ICD-10-CM | POA: Insufficient documentation

## 2014-11-08 DIAGNOSIS — E10621 Type 1 diabetes mellitus with foot ulcer: Secondary | ICD-10-CM | POA: Diagnosis present

## 2014-11-08 DIAGNOSIS — Z7401 Bed confinement status: Secondary | ICD-10-CM | POA: Diagnosis not present

## 2014-11-08 DIAGNOSIS — L89899 Pressure ulcer of other site, unspecified stage: Secondary | ICD-10-CM | POA: Diagnosis not present

## 2014-11-08 DIAGNOSIS — Z794 Long term (current) use of insulin: Secondary | ICD-10-CM | POA: Diagnosis not present

## 2014-11-08 DIAGNOSIS — E1040 Type 1 diabetes mellitus with diabetic neuropathy, unspecified: Secondary | ICD-10-CM | POA: Diagnosis present

## 2014-11-08 DIAGNOSIS — M25451 Effusion, right hip: Secondary | ICD-10-CM

## 2014-11-08 DIAGNOSIS — R21 Rash and other nonspecific skin eruption: Secondary | ICD-10-CM | POA: Diagnosis not present

## 2014-11-08 DIAGNOSIS — Z933 Colostomy status: Secondary | ICD-10-CM

## 2014-11-08 DIAGNOSIS — L98499 Non-pressure chronic ulcer of skin of other sites with unspecified severity: Secondary | ICD-10-CM | POA: Insufficient documentation

## 2014-11-08 DIAGNOSIS — L97529 Non-pressure chronic ulcer of other part of left foot with unspecified severity: Secondary | ICD-10-CM

## 2014-11-08 DIAGNOSIS — M549 Dorsalgia, unspecified: Secondary | ICD-10-CM | POA: Diagnosis not present

## 2014-11-08 DIAGNOSIS — E08621 Diabetes mellitus due to underlying condition with foot ulcer: Secondary | ICD-10-CM | POA: Insufficient documentation

## 2014-11-08 LAB — CBC WITH DIFFERENTIAL/PLATELET
BASOS ABS: 0 10*3/uL (ref 0.0–0.1)
Basophils Relative: 0 % (ref 0–1)
Eosinophils Absolute: 0.3 10*3/uL (ref 0.0–0.7)
Eosinophils Relative: 1 % (ref 0–5)
HCT: 24.7 % — ABNORMAL LOW (ref 39.0–52.0)
Hemoglobin: 7.9 g/dL — ABNORMAL LOW (ref 13.0–17.0)
LYMPHS ABS: 1.5 10*3/uL (ref 0.7–4.0)
Lymphocytes Relative: 6 % — ABNORMAL LOW (ref 12–46)
MCH: 25.6 pg — AB (ref 26.0–34.0)
MCHC: 32 g/dL (ref 30.0–36.0)
MCV: 80.2 fL (ref 78.0–100.0)
Monocytes Absolute: 3.4 10*3/uL — ABNORMAL HIGH (ref 0.1–1.0)
Monocytes Relative: 14 % — ABNORMAL HIGH (ref 3–12)
NEUTROS PCT: 79 % — AB (ref 43–77)
Neutro Abs: 19.4 10*3/uL — ABNORMAL HIGH (ref 1.7–7.7)
Platelets: 508 10*3/uL — ABNORMAL HIGH (ref 150–400)
RBC: 3.08 MIL/uL — ABNORMAL LOW (ref 4.22–5.81)
RDW: 16 % — AB (ref 11.5–15.5)
WBC: 24.5 10*3/uL — AB (ref 4.0–10.5)

## 2014-11-08 LAB — COMPREHENSIVE METABOLIC PANEL
ALBUMIN: 1.9 g/dL — AB (ref 3.5–5.2)
ALK PHOS: 144 U/L — AB (ref 39–117)
ALT: 10 U/L (ref 0–53)
ANION GAP: 5 (ref 5–15)
AST: 10 U/L (ref 0–37)
BILIRUBIN TOTAL: 0.6 mg/dL (ref 0.3–1.2)
BUN: 21 mg/dL (ref 6–23)
CALCIUM: 10.6 mg/dL — AB (ref 8.4–10.5)
CHLORIDE: 109 mmol/L (ref 96–112)
CO2: 20 mmol/L (ref 19–32)
Creatinine, Ser: 1.55 mg/dL — ABNORMAL HIGH (ref 0.50–1.35)
GFR, EST AFRICAN AMERICAN: 68 mL/min — AB (ref >90)
GFR, EST NON AFRICAN AMERICAN: 59 mL/min — AB (ref >90)
GLUCOSE: 152 mg/dL — AB (ref 70–99)
Potassium: 4 mmol/L (ref 3.5–5.1)
Sodium: 134 mmol/L — ABNORMAL LOW (ref 135–145)
TOTAL PROTEIN: 6.8 g/dL (ref 6.0–8.3)

## 2014-11-08 LAB — URINE MICROSCOPIC-ADD ON

## 2014-11-08 LAB — URINALYSIS, ROUTINE W REFLEX MICROSCOPIC
BILIRUBIN URINE: NEGATIVE
GLUCOSE, UA: NEGATIVE mg/dL
KETONES UR: NEGATIVE mg/dL
Nitrite: NEGATIVE
PH: 5 (ref 5.0–8.0)
Protein, ur: 30 mg/dL — AB
SPECIFIC GRAVITY, URINE: 1.016 (ref 1.005–1.030)
UROBILINOGEN UA: 0.2 mg/dL (ref 0.0–1.0)

## 2014-11-08 LAB — I-STAT CHEM 8, ED
BUN: 21 mg/dL (ref 6–23)
CHLORIDE: 108 mmol/L (ref 96–112)
Calcium, Ion: 1.63 mmol/L — ABNORMAL HIGH (ref 1.12–1.23)
Creatinine, Ser: 1.6 mg/dL — ABNORMAL HIGH (ref 0.50–1.35)
GLUCOSE: 149 mg/dL — AB (ref 70–99)
HCT: 26 % — ABNORMAL LOW (ref 39.0–52.0)
Hemoglobin: 8.8 g/dL — ABNORMAL LOW (ref 13.0–17.0)
Potassium: 4 mmol/L (ref 3.5–5.1)
Sodium: 138 mmol/L (ref 135–145)
TCO2: 17 mmol/L (ref 0–100)

## 2014-11-08 LAB — RETICULOCYTES
RBC.: 3 MIL/uL — ABNORMAL LOW (ref 4.22–5.81)
Retic Count, Absolute: 27 10*3/uL (ref 19.0–186.0)
Retic Ct Pct: 0.9 % (ref 0.4–3.1)

## 2014-11-08 LAB — GLUCOSE, CAPILLARY: Glucose-Capillary: 151 mg/dL — ABNORMAL HIGH (ref 70–99)

## 2014-11-08 LAB — CREATININE, URINE, RANDOM: Creatinine, Urine: 75.73 mg/dL

## 2014-11-08 LAB — SODIUM, URINE, RANDOM: SODIUM UR: 43 mmol/L

## 2014-11-08 MED ORDER — PREGABALIN 100 MG PO CAPS
200.0000 mg | ORAL_CAPSULE | Freq: Every day | ORAL | Status: DC
  Administered 2014-11-08 – 2014-11-17 (×10): 200 mg via ORAL
  Filled 2014-11-08 (×11): qty 2

## 2014-11-08 MED ORDER — GLUCERNA SHAKE PO LIQD
237.0000 mL | Freq: Three times a day (TID) | ORAL | Status: DC
  Administered 2014-11-09 – 2014-11-19 (×23): 237 mL via ORAL

## 2014-11-08 MED ORDER — POTASSIUM CHLORIDE CRYS ER 20 MEQ PO TBCR
20.0000 meq | EXTENDED_RELEASE_TABLET | Freq: Every day | ORAL | Status: DC
  Administered 2014-11-09 – 2014-11-14 (×5): 20 meq via ORAL
  Filled 2014-11-08 (×7): qty 1

## 2014-11-08 MED ORDER — VANCOMYCIN HCL 10 G IV SOLR
1250.0000 mg | Freq: Once | INTRAVENOUS | Status: DC
  Filled 2014-11-08: qty 1250

## 2014-11-08 MED ORDER — INSULIN GLARGINE 100 UNIT/ML ~~LOC~~ SOLN: 28.0000 [IU] | Freq: Every day | SUBCUTANEOUS | Status: DC

## 2014-11-08 MED ORDER — OXYCODONE HCL 5 MG PO TABS
5.0000 mg | ORAL_TABLET | Freq: Three times a day (TID) | ORAL | Status: DC | PRN
  Administered 2014-11-08 – 2014-11-19 (×9): 5 mg via ORAL
  Filled 2014-11-08 (×11): qty 1

## 2014-11-08 MED ORDER — INSULIN GLARGINE 100 UNIT/ML ~~LOC~~ SOLN
25.0000 [IU] | Freq: Every day | SUBCUTANEOUS | Status: DC
  Administered 2014-11-08 – 2014-11-11 (×4): 25 [IU] via SUBCUTANEOUS
  Filled 2014-11-08 (×5): qty 0.25

## 2014-11-08 MED ORDER — INSULIN ASPART 100 UNIT/ML ~~LOC~~ SOLN
0.0000 [IU] | Freq: Three times a day (TID) | SUBCUTANEOUS | Status: DC
  Administered 2014-11-10: 1 [IU] via SUBCUTANEOUS
  Administered 2014-11-11: 2 [IU] via SUBCUTANEOUS
  Administered 2014-11-11 – 2014-11-13 (×3): 3 [IU] via SUBCUTANEOUS
  Administered 2014-11-13: 5 [IU] via SUBCUTANEOUS
  Administered 2014-11-15 – 2014-11-16 (×4): 2 [IU] via SUBCUTANEOUS
  Administered 2014-11-17: 1 [IU] via SUBCUTANEOUS
  Administered 2014-11-17: 3 [IU] via SUBCUTANEOUS
  Administered 2014-11-17: 1 [IU] via SUBCUTANEOUS
  Administered 2014-11-18: 3 [IU] via SUBCUTANEOUS
  Administered 2014-11-18: 5 [IU] via SUBCUTANEOUS
  Administered 2014-11-19 (×2): 3 [IU] via SUBCUTANEOUS
  Administered 2014-11-19: 2 [IU] via SUBCUTANEOUS

## 2014-11-08 MED ORDER — METOCLOPRAMIDE HCL 10 MG PO TABS
5.0000 mg | ORAL_TABLET | Freq: Every day | ORAL | Status: DC | PRN
  Filled 2014-11-08: qty 1

## 2014-11-08 MED ORDER — LORATADINE 10 MG PO TABS
10.0000 mg | ORAL_TABLET | Freq: Every day | ORAL | Status: DC
  Administered 2014-11-09 – 2014-11-18 (×9): 10 mg via ORAL
  Filled 2014-11-08 (×11): qty 1

## 2014-11-08 MED ORDER — OXYCODONE-ACETAMINOPHEN 5-325 MG PO TABS
1.0000 | ORAL_TABLET | Freq: Three times a day (TID) | ORAL | Status: DC | PRN
  Administered 2014-11-08 – 2014-11-19 (×10): 1 via ORAL
  Filled 2014-11-08 (×11): qty 1

## 2014-11-08 MED ORDER — INSULIN ASPART 100 UNIT/ML ~~LOC~~ SOLN
0.0000 [IU] | Freq: Every day | SUBCUTANEOUS | Status: DC
  Administered 2014-11-16 – 2014-11-17 (×2): 3 [IU] via SUBCUTANEOUS

## 2014-11-08 MED ORDER — BACLOFEN 10 MG PO TABS
10.0000 mg | ORAL_TABLET | Freq: Two times a day (BID) | ORAL | Status: DC
  Administered 2014-11-09 – 2014-11-18 (×15): 10 mg via ORAL
  Filled 2014-11-08 (×21): qty 2

## 2014-11-08 MED ORDER — FENTANYL 12 MCG/HR TD PT72: 12.5000 ug | MEDICATED_PATCH | TRANSDERMAL | Status: DC

## 2014-11-08 MED ORDER — HYDROMORPHONE HCL 1 MG/ML IJ SOLN
0.5000 mg | INTRAMUSCULAR | Status: DC | PRN
  Administered 2014-11-09 – 2014-11-10 (×3): 0.5 mg via INTRAVENOUS
  Filled 2014-11-08 (×3): qty 1

## 2014-11-08 MED ORDER — OXYCODONE-ACETAMINOPHEN 10-325 MG PO TABS: 1.0000 | ORAL_TABLET | Freq: Three times a day (TID) | ORAL | Status: DC | PRN

## 2014-11-08 MED ORDER — MORPHINE SULFATE 2 MG/ML IJ SOLN
2.0000 mg | INTRAMUSCULAR | Status: DC | PRN
  Administered 2014-11-08: 2 mg via INTRAVENOUS
  Filled 2014-11-08: qty 1

## 2014-11-08 MED ORDER — HYDROMORPHONE HCL 1 MG/ML IJ SOLN
1.0000 mg | Freq: Once | INTRAMUSCULAR | Status: AC
  Administered 2014-11-08: 1 mg via INTRAVENOUS
  Filled 2014-11-08: qty 1

## 2014-11-08 MED ORDER — POLYETHYLENE GLYCOL 3350 17 GM/SCOOP PO POWD: 17.0000 g | Freq: Every day | ORAL | Status: DC

## 2014-11-08 MED ORDER — SODIUM CHLORIDE 0.9 % IV SOLN
INTRAVENOUS | Status: DC
  Administered 2014-11-08 – 2014-11-09 (×2): via INTRAVENOUS

## 2014-11-08 MED ORDER — VANCOMYCIN HCL 500 MG IV SOLR
500.0000 mg | Freq: Two times a day (BID) | INTRAVENOUS | Status: DC
  Filled 2014-11-08: qty 500

## 2014-11-08 MED ORDER — BISACODYL 5 MG PO TBEC: 5.0000 mg | DELAYED_RELEASE_TABLET | Freq: Two times a day (BID) | ORAL | Status: DC | PRN

## 2014-11-08 MED ORDER — VANCOMYCIN HCL IN DEXTROSE 750-5 MG/150ML-% IV SOLN
750.0000 mg | INTRAVENOUS | Status: DC
  Administered 2014-11-08: 750 mg via INTRAVENOUS
  Filled 2014-11-08 (×2): qty 150

## 2014-11-08 MED ORDER — HEPARIN SODIUM (PORCINE) 5000 UNIT/ML IJ SOLN
5000.0000 [IU] | Freq: Three times a day (TID) | INTRAMUSCULAR | Status: DC
  Administered 2014-11-08 – 2014-11-17 (×27): 5000 [IU] via SUBCUTANEOUS
  Filled 2014-11-08 (×37): qty 1

## 2014-11-08 MED ORDER — ATORVASTATIN CALCIUM 20 MG PO TABS
20.0000 mg | ORAL_TABLET | Freq: Every day | ORAL | Status: DC
  Administered 2014-11-09 – 2014-11-18 (×10): 20 mg via ORAL
  Filled 2014-11-08 (×10): qty 1

## 2014-11-08 MED ORDER — SODIUM CHLORIDE 0.9 % IV BOLUS (SEPSIS)
1000.0000 mL | Freq: Once | INTRAVENOUS | Status: AC
  Administered 2014-11-08: 1000 mL via INTRAVENOUS

## 2014-11-08 MED ORDER — PREGABALIN 100 MG PO CAPS
100.0000 mg | ORAL_CAPSULE | Freq: Three times a day (TID) | ORAL | Status: DC
  Administered 2014-11-09 – 2014-11-18 (×23): 100 mg via ORAL
  Filled 2014-11-08 (×23): qty 1

## 2014-11-08 MED ORDER — FENTANYL 25 MCG/HR TD PT72: 25.0000 ug | MEDICATED_PATCH | TRANSDERMAL | Status: DC

## 2014-11-08 MED ORDER — POLYETHYLENE GLYCOL 3350 17 G PO PACK
17.0000 g | PACK | Freq: Two times a day (BID) | ORAL | Status: DC | PRN
  Filled 2014-11-08: qty 1

## 2014-11-08 MED ORDER — BACLOFEN 20 MG PO TABS
20.0000 mg | ORAL_TABLET | Freq: Every day | ORAL | Status: DC
  Administered 2014-11-08 – 2014-11-17 (×10): 20 mg via ORAL
  Filled 2014-11-08 (×12): qty 1

## 2014-11-08 MED ORDER — PANTOPRAZOLE SODIUM 40 MG PO TBEC
40.0000 mg | DELAYED_RELEASE_TABLET | Freq: Every day | ORAL | Status: DC
  Administered 2014-11-09 – 2014-11-18 (×9): 40 mg via ORAL
  Filled 2014-11-08 (×10): qty 1

## 2014-11-08 MED ORDER — GUAIFENESIN-DM 100-10 MG/5ML PO SYRP
5.0000 mL | ORAL_SOLUTION | ORAL | Status: DC | PRN
  Filled 2014-11-08: qty 5

## 2014-11-08 MED ORDER — ONDANSETRON HCL 4 MG/2ML IJ SOLN: 4.0000 mg | Freq: Four times a day (QID) | INTRAMUSCULAR | Status: DC | PRN

## 2014-11-08 MED ORDER — PRO-STAT SUGAR FREE PO LIQD
30.0000 mL | Freq: Every day | ORAL | Status: DC
  Administered 2014-11-10: 30 mL via ORAL
  Administered 2014-11-11: 10:00:00 via ORAL
  Administered 2014-11-13 – 2014-11-18 (×6): 30 mL via ORAL
  Filled 2014-11-08 (×10): qty 30

## 2014-11-08 MED ORDER — SODIUM CHLORIDE 0.9 % IJ SOLN
10.0000 mL | INTRAMUSCULAR | Status: DC | PRN
  Administered 2014-11-09: 20 mL
  Administered 2014-11-09 – 2014-11-14 (×8): 10 mL
  Filled 2014-11-08 (×9): qty 40

## 2014-11-08 MED ORDER — SODIUM CHLORIDE 0.9 % IV SOLN
1.0000 g | Freq: Three times a day (TID) | INTRAVENOUS | Status: DC
  Administered 2014-11-08 – 2014-11-18 (×32): 1 g via INTRAVENOUS
  Filled 2014-11-08 (×34): qty 1

## 2014-11-08 MED ORDER — DIPHENHYDRAMINE HCL 50 MG/ML IJ SOLN
25.0000 mg | Freq: Once | INTRAMUSCULAR | Status: AC
  Administered 2014-11-08: 25 mg via INTRAVENOUS
  Filled 2014-11-08: qty 1

## 2014-11-08 MED ORDER — ALPRAZOLAM 0.5 MG PO TABS
1.0000 mg | ORAL_TABLET | Freq: Four times a day (QID) | ORAL | Status: DC | PRN
  Administered 2014-11-11 – 2014-11-12 (×2): 1 mg via ORAL
  Filled 2014-11-08 (×2): qty 2

## 2014-11-08 MED ORDER — ONDANSETRON HCL 4 MG PO TABS: 4.0000 mg | ORAL_TABLET | Freq: Four times a day (QID) | ORAL | Status: DC | PRN

## 2014-11-08 NOTE — Telephone Encounter (Signed)
RN reviewed labs from [facility] this morning.  Labs from 1/29 show increase in WBC (12.4 on 1/27 -> 24.8), RBC=9.2, PLT=572, ESR=79, CRP-146.5. Vanc trough was 29.8, but was not an accurate trough due to timing of nurse visit and last vancomycin administration time.  [name] advised holding one dose of vancomycin 1/29, advised patient's mother to administer 737m q 24 hours with redraw of labs 1/31.  Labs 1/31 were WBC=23.0, RBC=8.0, PLT=544 (no ESR/CRP).  Vanc trough = 24.2, but again was not true trough due to lab being drawn 12 hours after last dose.  BUN now = 22, Creatinine = 1.74.   Spoke with both Dr. VTommy Medal Dr. CMegan Salon and patient's mother.  Patient complaining of not feeling well and a stomach ache (denies N/V/D).  Mother reports patient has new temperature of 100, BP 94/60, new blister/peeling skin on his back.  [name] is advised to bring him to the ED for evaluation of sepsis. Mother agreed. HLandis Gandy RN

## 2014-11-08 NOTE — ED Provider Notes (Signed)
5:12 PM Patient signed out to me by Piepenbrink, PA-C.    Patient PMH of paraplegia, presents because of lab abnormality.  Patient getting vanc through PICC for sacral decubitus ulcer.  Required extensive debridement.  Followed by Dr. Tommy Medal for this.  PCP is Dr. Sarajane Jews.  Reportedly when wound care team drew labs, Creatinine and BUN were markedly elevated.  Dr. Tommy Medal recommended coming to the ED for admission for AKI.  Patient's pain is tolerable.  He is alert and oriented.  Cr is now 1.55, up from 0.62.  Will admit.  5:21 PM Appreciate Dr. Candiss Norse, from [facility], who will admit.  Montine Circle, PA-C 11/08/14 [place], MD 11/08/14 1728

## 2014-11-08 NOTE — Progress Notes (Signed)
Patient arrived to the unit alert and oriented x4, with wound vac leaking and malodorous.  Wound Vac removed wet to dry and foam dressing applied to sacral and right ischial wounds.  No complaints of pain will continue to monitor patient

## 2014-11-08 NOTE — H&P (Addendum)
Patient Demographics  Jason [name], is a 29 y.o. male  MRN: [id number]   DOB - 1986-04-24  Admit Date - 11/08/2014  Outpatient Primary MD for the patient is [name], MD   With History of -  Past Medical History  Diagnosis Date  . GERD (gastroesophageal reflux disease)   . Asthma   . Hx MRSA infection     on face  . Gastroparesis   . Diabetic neuropathy   . Seizures   . Stroke     spinal stroke in 4/15  . Diabetes mellitus     sees Dr. Loanne Drilling   . Family history of anesthesia complication     Pt mother can't have epidural procedures  . Dysrhythmia   . Pneumonia   . Arthritis   . Fibromyalgia       Past Surgical History  Procedure Laterality Date  . Tonsillectomy    . Multiple extractions with alveoloplasty N/A 08/03/2014    Procedure: MULTIPLE EXTRACTIONS;  Surgeon: Gae Bon, DDS;  Location: [place];  Service: Oral Surgery;  Laterality: N/A;  . Tee without cardioversion N/A 08/17/2014    Procedure: TRANSESOPHAGEAL ECHOCARDIOGRAM (TEE);  Surgeon: Dorothy Spark, MD;  Location: [facility] ENDOSCOPY;  Service: Cardiovascular;  Laterality: N/A;  . Debridment of decubitus ulcer N/A 10/04/2014    Procedure: DEBRIDMENT OF DECUBITUS ULCER;  Surgeon: Georganna Skeans, MD;  Location: [place];  Service: General;  Laterality: N/A;  . Laparoscopic diverted colostomy N/A 10/12/2014    Procedure: LAPAROSCOPIC DIVERTING COLOSTOMY;  Surgeon: Donnie Mesa, MD;  Location: [place];  Service: General;  Laterality: N/A;  . Insertion of suprapubic catheter N/A 10/12/2014    Procedure: INSERTION OF SUPRAPUBIC CATHETER;  Surgeon: Reece Packer, MD;  Location: [place];  Service: Urology;  Laterality: N/A;    in for   Chief Complaint  Patient presents with  . Abnormal Lab     HPI  Jason [name]  is a 29 y.o. male,  is true for essential quadriplegia, age for sacral decubitus ulcer with pelvic ostium mellitus who has a PICC line and undergoing IV vancomycin treatment since first week of January, suprapubic catheter placed 3 weeks ago by Dr. Vikki Ports, fibromyalgia, spinal stroke, colostomy, who had routine blood work done by infectious disease office as he is getting vancomycin, on blood work he was noted to have acute renal failure requested to come to the ER.   In the ER he was found to have sepsis secondary to likely decubitus ulcer infection, white count was elevated from a baseline of around 16,000-24, he had some foul-smelling discharge from the decubitus ulcer, he also had her new blister on his upper back which is typical for him when he gets infection according to the mother, there is some subjective low-grade fevers at home. Patient looks nontoxic. I was called to admit the patient.    Review of Systems    In addition to the HPI above,  No Fever-chills, No Headache, No changes with Vision or hearing, No problems swallowing food or Liquids, No Chest pain, Cough or Shortness of Breath, No Abdominal pain, No Nausea or Vommitting, Bowel movements are regular, No Blood in stool or Urine, No dysuria, No new skin rashes or bruises, except some foul-smelling discharge from his sacral decubitus ulcers and a new blister on his upper back hasn't picture below. No new joints pains-aches,  No new weakness, tingling, numbness in any extremity, No recent weight gain or loss, No polyuria, polydypsia or polyphagia, No significant Mental Stressors.  A full 10 point Review of Systems was done, except as stated above, all other Review of Systems were negative.   Social History History  Substance Use Topics  . Smoking status: Current Some Day Smoker    Types: Cigars, Cigarettes    Last Attempt to Quit: 01/29/2014  . Smokeless tobacco: Never Used     Comment: e-cigarette and cigars currently  . Alcohol  Use: No     Comment: rarely      Family History Family History  Problem Relation Age of Onset  . Diabetes Father   . Hypertension Father   . Asthma      fhx  . Hypertension      fhx  . Stroke      fhx  . Heart disease Mother       Prior to Admission medications   Medication Sig Start Date End Date Taking? Authorizing Provider  albuterol (PROVENTIL) (2.5 MG/3ML) 0.083% nebulizer solution Take 3 mLs (2.5 mg total) by nebulization every 4 (four) hours as needed for wheezing or shortness of breath. 04/27/14   [name], MD  ALPRAZolam Duanne Moron) 1 MG tablet Take 1 tablet (1 mg total) by mouth every 6 (six) hours as needed for anxiety. 08/27/14   [name], MD  Amino Acids-Protein Hydrolys (FEEDING SUPPLEMENT, PRO-STAT SUGAR FREE 64,) LIQD Take 30 mLs by mouth daily. 08/24/14   Orson Eva, MD  atorvastatin (LIPITOR) 20 MG tablet Take 20 mg by mouth daily. 07/20/14   Historical Provider, MD  baclofen (LIORESAL) 10 MG tablet Take 0.5 tablets (5 mg total) by mouth 3 (three) times daily. Takes 10mg  in morning and lunchtime and takes 20mg  at bedtime 10/15/14   Florencia Reasons, MD  bisacodyl (DULCOLAX) 5 MG EC tablet Take 1 tablet (5 mg total) by mouth 2 (two) times daily as needed for moderate constipation. 09/15/14   [name], MD  collagenase (SANTYL) ointment Apply 1 application topically daily. 08/30/14   [name], MD  dexlansoprazole (DEXILANT) 60 MG capsule Take 60 mg by mouth daily.    Historical Provider, MD  diclofenac sodium (VOLTAREN) 1 % GEL Apply 2 g topically daily as needed (for pain).    Historical Provider, MD  feeding supplement, GLUCERNA SHAKE, (GLUCERNA SHAKE) LIQD Take 237 mLs by mouth 3 (three) times daily between meals. 08/24/14   Orson Eva, MD  fentaNYL (DURAGESIC - DOSED MCG/HR) 12 MCG/HR Place 1 patch (12.5 mcg total) onto the skin every 3 (three) days. 11/05/14   Danella Sensing, NP  glucagon (GLUCAGON EMERGENCY) 1 MG injection Inject 1 mg into the vein once as needed.  04/20/14   [name], MD  glucose blood (ACCU-CHEK AVIVA PLUS) test strip Use to check blood sugar 4 times per day 07/21/14   Renato Shin, MD  insulin glargine (LANTUS) 100 UNIT/ML injection Inject 0.28 mLs (28 Units total) into the skin daily. 10/14/14  [name] York, PA-C  loratadine (CLARITIN) 10 MG tablet Take 10 mg by mouth daily.     Historical Provider, MD  metoCLOPramide (REGLAN) 5 MG tablet Take 1 tablet (5 mg total) by mouth 2 (two) times a week. And as needed for nausea 07/01/14   Meredith Staggers, MD  mupirocin cream (BACTROBAN) 2 % Apply 1 application topically 2 (two) times daily. 09/28/14   [name], MD  ondansetron (ZOFRAN) 8 MG tablet Take 8 mg by mouth 2 (two) times daily as needed for nausea or vomiting.  07/07/14   Historical Provider, MD  oxyCODONE-acetaminophen (PERCOCET) 10-325 MG per tablet Take 1 tablet by mouth every 8 (eight) hours as needed for pain. 11/05/14   Danella Sensing, NP  polyethylene glycol powder (GLYCOLAX/MIRALAX) powder Take 17 g by mouth daily. 09/15/14   [name], MD  potassium chloride SA (KLOR-CON M20) 20 MEQ tablet Take 1 tablet (20 mEq total) by mouth daily. 10/25/14   [name], MD  pregabalin (LYRICA) 100 MG capsule Take 100-200 mg by mouth 4 (four) times daily. Take 100 mg by mouth in the morning, take 100 mg by mouth in the afternoon, take 100 mg by mouth in the evening and take 200 mg by mouth at bedtime.    Historical Provider, MD  vancomycin 500 mg in sodium chloride 0.9 % 100 mL Inject 500 mg into the vein every 12 (twelve) hours. Patient taking differently: Inject 750 mg into the vein every 12 (twelve) hours.  10/14/14 11/24/14  Melton Alar, PA-C    Allergies  Allergen Reactions  . Cefuroxime Axetil Anaphylaxis  . Penicillins Anaphylaxis and Other (See Comments)    ?can take amoxicillin?  [name] [Benzonatate] Anaphylaxis  . Shellfish Allergy Itching and Other (See Comments)    Took benadryl to alleviate reaction  .  Morphine And Related     Changed mental status, confusion    Physical Exam  Vitals  Blood pressure 98/64, pulse 76, temperature 98.8 F (37.1 C), temperature source Oral, resp. rate 15, height 5\' 8"  (1.727 m), weight 63.504 kg (140 lb), SpO2 94 %.   1. General Young African-American male lying in bed in NAD,    2. Normal affect and insight, Not Suicidal or Homicidal, Awake Alert, Oriented X 3.  3. No F.N deficits, ALL C.Nerves Intact, Strength 5/5 all 4 extremities, Sensation intact all 4 extremities, Plantars down going.  4. Ears and Eyes appear Normal, Conjunctivae clear, PERRLA. Moist Oral Mucosa.  5. Supple Neck, No JVD, No cervical lymphadenopathy appriciated, No Carotid Bruits.  6. Symmetrical Chest wall movement, Good air movement bilaterally, CTAB.  7. RRR, No Gallops, Rubs or Murmurs, No Parasternal Heave.  8. Positive Bowel Sounds, Abdomen Soft, No tenderness, No organomegaly appriciated,No rebound -guarding or rigidity. Suprapubic catheter in place. Colostomy bag stable.  9.  No Cyanosis, Normal Skin Turgor, No Skin Rash or Bruise.  10. Good muscle tone,  joints appear normal , no effusions, Normal ROM.  11. No Palpable Lymph Nodes in Neck or Axillae, sacral decubitus ulcer and skin blister below.     Data Review  CBC  Recent Labs Lab 11/08/14 1534 11/08/14 1646  WBC 24.5*  --   HGB 7.9* 8.8*  HCT 24.7* 26.0*  PLT 508*  --   MCV 80.2  --   MCH 25.6*  --   MCHC 32.0  --   RDW 16.0*  --   LYMPHSABS 1.5  --   MONOABS  3.4*  --   EOSABS 0.3  --   BASOSABS 0.0  --    ------------------------------------------------------------------------------------------------------------------  Chemistries   Recent Labs Lab 11/08/14 1534 11/08/14 1646  NA 134* 138  K 4.0 4.0  CL 109 108  CO2 20  --   GLUCOSE 152* 149*  BUN 21 21  CREATININE 1.55* 1.60*  CALCIUM 10.6*  --   AST 10  --   ALT 10  --   ALKPHOS 144*  --   BILITOT 0.6  --     ------------------------------------------------------------------------------------------------------------------ estimated creatinine clearance is 60.6 mL/min (by C-G formula based on Cr of 1.6). ------------------------------------------------------------------------------------------------------------------ No results for input(s): TSH, T4TOTAL, T3FREE, THYROIDAB in the last 72 hours.  Invalid input(s): FREET3   Coagulation profile No results for input(s): INR, PROTIME in the last 168 hours. ------------------------------------------------------------------------------------------------------------------- No results for input(s): DDIMER in the last 72 hours. -------------------------------------------------------------------------------------------------------------------  Cardiac Enzymes No results for input(s): CKMB, TROPONINI, MYOGLOBIN in the last 168 hours.  Invalid input(s): CK ------------------------------------------------------------------------------------------------------------------ Invalid input(s): POCBNP   ---------------------------------------------------------------------------------------------------------------  Urinalysis    Component Value Date/Time   COLORURINE YELLOW 11/08/2014 1513   APPEARANCEUR CLOUDY* 11/08/2014 1513   LABSPEC 1.016 11/08/2014 1513   PHURINE 5.0 11/08/2014 1513   GLUCOSEU NEGATIVE 11/08/2014 1513   HGBUR SMALL* 11/08/2014 1513   BILIRUBINUR NEGATIVE 11/08/2014 1513   BILIRUBINUR neg 06/28/2014 1445   KETONESUR NEGATIVE 11/08/2014 1513   PROTEINUR 30* 11/08/2014 1513   PROTEINUR + 06/28/2014 1445   UROBILINOGEN 0.2 11/08/2014 1513   UROBILINOGEN 0.2 06/28/2014 1445   NITRITE NEGATIVE 11/08/2014 1513   NITRITE + 06/28/2014 1445   LEUKOCYTESUR LARGE* 11/08/2014 1513    ----------------------------------------------------------------------------------------------------------------  Imaging results:   No results  found.       Assessment & Plan   1. 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 Patient currently on IV Vanco for the last 3 weeks, will be admitted to a MedSurg bed, blood cultures, will place on IV vancomycin along with meropenem, he has remote history of MSSA bacteremia. Wound care and ostomy nurses will be consulted. We will consult ID in the morning. Likely will require 6-8 weeks of prolonged IV antibiotics.   2. Protein calorie malnutrition. Continue protein supplementation.   3. Spinal cord infarction causing functional quadriplegia and neurogenic bladder. Supportive care. He has received suprapubic catheter 3 weeks ago by Dr. Vikki Ports, due for change on fifth of February. UA has been obtained follow cultures.   4. History of smoking. Counseled to quit.    5. DM type 1. Check A1c, continue Lantus at sliding scale.    6. ARF. Hydrate and monitor.    7. Anemia. Likely of chronic disease, check anemia panel.     DVT Prophylaxis Heparin    AM Labs Ordered, also please review Full Orders  Family Communication: Admission, patients condition and plan of care including tests being ordered have been discussed with the patient and mother who indicate understanding and agree with the plan and Code Status.  Code Status Full  Likely DC to  TBD  Condition GUARDED     Time spent in minutes : 35    SINGH,PRASHANT K M.D on 11/08/2014 at 5:47 PM  Between 7am to 7pm - Pager - [phone]  After 7pm go to [email or web address] - [facility] Office  [phone]

## 2014-11-08 NOTE — ED Notes (Signed)
Spoke with IV team about pts picc line. Reported to them that pt has not been able to pull blood back recently for lab work. IV team said if the PICC flushed okay that this RN could assess it.

## 2014-11-08 NOTE — ED Notes (Signed)
Per [facility], pt coming in from home today due to abnormal lab work. Pt sent in by PCP due to elevated BUN and Creatinine. Pt also currently being seen for sacral wound at the wound center. Pt has wound vac on at this time. Pt is paraplegic. Pt reports that he feels fine.

## 2014-11-08 NOTE — Telephone Encounter (Signed)
Pt has been hospitalized again. Mom called

## 2014-11-08 NOTE — ED Provider Notes (Signed)
CSN: [id number]     Arrival date & time 11/08/14  1412 History   First MD Initiated Contact with Patient 11/08/14 1413     Chief Complaint  Patient presents with  . Abnormal Lab     (Consider location/radiation/quality/duration/timing/severity/associated sxs/prior Treatment) HPI Comments: Patient is a 29 year old male past medical history significant for GERD, asthma, spinal stroke, DM, fibromyalgia, paraplegia presenting to the emergency department after he was called by his primary care doctor regarding increasing creatinine and BUN. He states he has not had any fevers, chills, nausea, vomiting, diarrhea. States his colostomy site is mildly irritating otherwise no abdominal pain. Patient is also currently undergoing outpatient treatment for 2 posterior decubitus ulcers. He has a wound VAC placed. He is on Vancomycin via PICC line at home.     Past Medical History  Diagnosis Date  . GERD (gastroesophageal reflux disease)   . Asthma   . Hx MRSA infection     on face  . Gastroparesis   . Diabetic neuropathy   . Seizures   . Stroke     spinal stroke in 4/15  . Diabetes mellitus     sees Dr. Loanne Drilling   . Family history of anesthesia complication     Pt mother can't have epidural procedures  . Dysrhythmia   . Pneumonia   . Arthritis   . Fibromyalgia    Past Surgical History  Procedure Laterality Date  . Tonsillectomy    . Multiple extractions with alveoloplasty N/A 08/03/2014    Procedure: MULTIPLE EXTRACTIONS;  Surgeon: Gae Bon, DDS;  Location: [place];  Service: Oral Surgery;  Laterality: N/A;  . Tee without cardioversion N/A 08/17/2014    Procedure: TRANSESOPHAGEAL ECHOCARDIOGRAM (TEE);  Surgeon: Dorothy Spark, MD;  Location: [facility] ENDOSCOPY;  Service: Cardiovascular;  Laterality: N/A;  . Debridment of decubitus ulcer N/A 10/04/2014    Procedure: DEBRIDMENT OF DECUBITUS ULCER;  Surgeon: Georganna Skeans, MD;  Location: [place];  Service: General;  Laterality: N/A;  .  Laparoscopic diverted colostomy N/A 10/12/2014    Procedure: LAPAROSCOPIC DIVERTING COLOSTOMY;  Surgeon: Donnie Mesa, MD;  Location: [place];  Service: General;  Laterality: N/A;  . Insertion of suprapubic catheter N/A 10/12/2014    Procedure: INSERTION OF SUPRAPUBIC CATHETER;  Surgeon: Reece Packer, MD;  Location: [place];  Service: Urology;  Laterality: N/A;   Family History  Problem Relation Age of Onset  . Diabetes Father   . Hypertension Father   . Asthma      fhx  . Hypertension      fhx  . Stroke      fhx  . Heart disease Mother    History  Substance Use Topics  . Smoking status: Current Some Day Smoker    Types: Cigars, Cigarettes    Last Attempt to Quit: 01/29/2014  . Smokeless tobacco: Never Used     Comment: e-cigarette and cigars currently  . Alcohol Use: No     Comment: rarely    Review of Systems  Respiratory: Negative for shortness of breath.   Cardiovascular: Negative for chest pain.  Gastrointestinal: Negative for nausea, vomiting and abdominal pain.  Skin: Positive for wound.  All other systems reviewed and are negative.     Allergies  Cefuroxime axetil; Penicillins; Tessalon; Shellfish allergy; and Morphine and related  Home Medications   Prior to Admission medications   Medication Sig Start Date End Date Taking? Authorizing Provider  albuterol (PROVENTIL) (2.5 MG/3ML) 0.083% nebulizer solution Take 3 mLs (  2.5 mg total) by nebulization every 4 (four) hours as needed for wheezing or shortness of breath. 04/27/14   [name], MD  ALPRAZolam Duanne Moron) 1 MG tablet Take 1 tablet (1 mg total) by mouth every 6 (six) hours as needed for anxiety. 08/27/14   [name], MD  Amino Acids-Protein Hydrolys (FEEDING SUPPLEMENT, PRO-STAT SUGAR FREE 64,) LIQD Take 30 mLs by mouth daily. 08/24/14   Orson Eva, MD  atorvastatin (LIPITOR) 20 MG tablet Take 20 mg by mouth daily. 07/20/14   Historical Provider, MD  baclofen (LIORESAL) 10 MG tablet Take 0.5 tablets (5  mg total) by mouth 3 (three) times daily. Takes 10mg  in morning and lunchtime and takes 20mg  at bedtime 10/15/14   Florencia Reasons, MD  bisacodyl (DULCOLAX) 5 MG EC tablet Take 1 tablet (5 mg total) by mouth 2 (two) times daily as needed for moderate constipation. 09/15/14   [name], MD  collagenase (SANTYL) ointment Apply 1 application topically daily. 08/30/14   [name], MD  dexlansoprazole (DEXILANT) 60 MG capsule Take 60 mg by mouth daily.    Historical Provider, MD  diclofenac sodium (VOLTAREN) 1 % GEL Apply 2 g topically daily as needed (for pain).    Historical Provider, MD  feeding supplement, GLUCERNA SHAKE, (GLUCERNA SHAKE) LIQD Take 237 mLs by mouth 3 (three) times daily between meals. 08/24/14   Orson Eva, MD  fentaNYL (DURAGESIC - DOSED MCG/HR) 12 MCG/HR Place 1 patch (12.5 mcg total) onto the skin every 3 (three) days. 11/05/14   Danella Sensing, NP  glucagon (GLUCAGON EMERGENCY) 1 MG injection Inject 1 mg into the vein once as needed. 04/20/14   [name], MD  glucose blood (ACCU-CHEK AVIVA PLUS) test strip Use to check blood sugar 4 times per day 07/21/14   Renato Shin, MD  insulin aspart (NOVOLOG) 100 UNIT/ML injection Inject 5 Units into the skin 3 (three) times daily with meals. 07/21/14   Renato Shin, MD  insulin glargine (LANTUS) 100 UNIT/ML injection Inject 0.28 mLs (28 Units total) into the skin daily. 10/14/14   [name] York, PA-C  loratadine (CLARITIN) 10 MG tablet Take 10 mg by mouth daily.     Historical Provider, MD  metoCLOPramide (REGLAN) 5 MG tablet Take 1 tablet (5 mg total) by mouth 2 (two) times a week. And as needed for nausea 07/01/14   Meredith Staggers, MD  mupirocin cream (BACTROBAN) 2 % Apply 1 application topically 2 (two) times daily. 09/28/14   [name], MD  ondansetron (ZOFRAN) 8 MG tablet Take 8 mg by mouth 2 (two) times daily as needed for nausea or vomiting.  07/07/14   Historical Provider, MD  oxyCODONE-acetaminophen (PERCOCET) 10-325 MG per tablet  Take 1 tablet by mouth every 8 (eight) hours as needed for pain. 11/05/14   Danella Sensing, NP  polyethylene glycol powder (GLYCOLAX/MIRALAX) powder Take 17 g by mouth daily. 09/15/14   [name], MD  potassium chloride SA (KLOR-CON M20) 20 MEQ tablet Take 1 tablet (20 mEq total) by mouth daily. 10/25/14   [name], MD  pregabalin (LYRICA) 100 MG capsule Take 100-200 mg by mouth 4 (four) times daily. Take 100 mg by mouth in the morning, take 100 mg by mouth in the afternoon, take 100 mg by mouth in the evening and take 200 mg by mouth at bedtime.    Historical Provider, MD  vancomycin 500 mg in sodium chloride 0.9 % 100 mL Inject 500  mg into the vein every 12 (twelve) hours. Patient taking differently: Inject 750 mg into the vein every 12 (twelve) hours.  10/14/14 11/24/14  Marianne L York, PA-C   BP 102/62 mmHg  Pulse 86  Temp(Src) 99.6 F (37.6 C) (Oral)  Resp 15  Ht 5\' 8"  (1.727 m)  Wt 140 lb (63.504 kg)  BMI 21.29 kg/m2  SpO2 100% Physical Exam  Constitutional: He is oriented to person, place, and time. He appears well-developed and well-nourished. No distress.  HENT:  Head: Normocephalic and atraumatic.  Right Ear: External ear normal.  Left Ear: External ear normal.  Nose: Nose normal.  Mouth/Throat: Oropharynx is clear and moist.  Eyes: Conjunctivae are normal.  Neck: Neck supple.  Cardiovascular: Normal rate, regular rhythm and normal heart sounds.   Pulmonary/Chest: Effort normal and breath sounds normal.  Abdominal: Soft. There is no tenderness.  Colostomy site without erythema, warmth or drainage. Brown stool noted in bag.   Neurological: He is alert and oriented to person, place, and time.  Skin: Skin is warm and dry. He is not diaphoretic.     Psychiatric: He has a normal mood and affect.  Nursing note and vitals reviewed.   ED Course  Procedures (including critical care time) Medications  sodium chloride 0.9 % bolus 1,000 mL (1,000 mLs Intravenous New  Bag/Given 11/08/14 1530)  HYDROmorphone (DILAUDID) injection 1 mg (1 mg Intravenous Given 11/08/14 1527)    Labs Review Labs Reviewed  CBC WITH DIFFERENTIAL/PLATELET - Abnormal; Notable for the following:    WBC 24.5 (*)    RBC 3.08 (*)    Hemoglobin 7.9 (*)    HCT 24.7 (*)    MCH 25.6 (*)    RDW 16.0 (*)    Platelets 508 (*)    Neutrophils Relative % 79 (*)    Neutro Abs 19.4 (*)    Lymphocytes Relative 6 (*)    Monocytes Relative 14 (*)    Monocytes Absolute 3.4 (*)    All other components within normal limits  URINALYSIS, ROUTINE W REFLEX MICROSCOPIC - Abnormal; Notable for the following:    APPearance CLOUDY (*)    Hgb urine dipstick SMALL (*)    Protein, ur 30 (*)    Leukocytes, UA LARGE (*)    All other components within normal limits  URINE MICROSCOPIC-ADD ON - Abnormal; Notable for the following:    Squamous Epithelial / LPF FEW (*)    Bacteria, UA FEW (*)    Casts HYALINE CASTS (*)    All other components within normal limits  I-STAT CHEM 8, ED - Abnormal; Notable for the following:    Creatinine, Ser 1.60 (*)    Glucose, Bld 149 (*)    Calcium, Ion 1.63 (*)    Hemoglobin 8.8 (*)    HCT 26.0 (*)    All other components within normal limits  URINE CULTURE  COMPREHENSIVE METABOLIC PANEL  CREATININE, URINE, RANDOM  SODIUM, URINE, RANDOM    Imaging Review No results found.   EKG Interpretation None      MDM   Final diagnoses:  None    Filed Vitals:   11/08/14 1545  BP: 102/62  Pulse: 86  Temp:   Resp:     CMP pending. Labs reviewed. Patient will likely need to be admitted for AKI as sent in by PCP. Will sign out to Montine Circle, PA-C pending lab results. Patient d/w with Dr. Venora Maples, agrees with plan.      George,  PA-C 11/08/14 [place], MD 11/08/14 [phone]

## 2014-11-08 NOTE — Progress Notes (Signed)
ANTIBIOTIC CONSULT NOTE - INITIAL  Pharmacy Consult for Merrem, Vancomycin  Indication: Sepsis/decubitus ulcers   Allergies  Allergen Reactions  . Cefuroxime Axetil Anaphylaxis  . Penicillins Anaphylaxis and Other (See Comments)    ?can take amoxicillin?  [name] [Benzonatate] Anaphylaxis  . Shellfish Allergy Itching and Other (See Comments)    Took benadryl to alleviate reaction  . Morphine And Related     Changed mental status, confusion    Patient Measurements: Height: 5\' 8"  (172.7 cm) Weight: 140 lb (63.504 kg) IBW/kg (Calculated) : 68.4  Vital Signs: Temp: 98.8 F (37.1 C) (02/01 1723) Temp Source: Oral (02/01 1723) BP: 102/62 mmHg (02/01 1545) Pulse Rate: 86 (02/01 1545) Intake/Output from previous day:   Intake/Output from this shift:    Labs:  Recent Labs  11/08/14 1513 11/08/14 1534 11/08/14 1646  WBC  --  24.5*  --   HGB  --  7.9* 8.8*  PLT  --  508*  --   LABCREA 75.73  --   --   CREATININE  --  1.55* 1.60*   Estimated Creatinine Clearance: 60.6 mL/min (by C-G formula based on Cr of 1.6). No results for input(s): VANCOTROUGH, VANCOPEAK, VANCORANDOM, GENTTROUGH, GENTPEAK, GENTRANDOM, TOBRATROUGH, TOBRAPEAK, TOBRARND, AMIKACINPEAK, AMIKACINTROU, AMIKACIN in the last 72 hours.   Microbiology: Recent Results (from the past 720 hour(s))  Culture, Urine     Status: None   Collection Time: 10/11/14  9:00 AM  Result Value Ref Range Status   Specimen Description URINE, RANDOM  Final   Special Requests NONE  Final   Colony Count NO GROWTH Performed at [facility]   Final   Culture NO GROWTH Performed at [facility]   Final   Report Status 10/12/2014 FINAL  Final  Surgical pcr screen     Status: None   Collection Time: 10/12/14  2:38 AM  Result Value Ref Range Status   MRSA, PCR NEGATIVE NEGATIVE Final   Staphylococcus aureus NEGATIVE NEGATIVE Final    Comment:        The Xpert SA Assay (FDA approved for NASAL specimens in  patients over 59 years of age), is one component of a comprehensive surveillance program.  Test performance has been validated by [employer] for patients greater than or equal to 74 year old. It is not intended to diagnose infection nor to guide or monitor treatment.   Urine culture     Status: None   Collection Time: 10/12/14 12:41 PM  Result Value Ref Range Status   Specimen Description URINE, RANDOM CYSTOSCOPE  Final   Special Requests NONE  Final   Colony Count NO GROWTH Performed at [facility]   Final   Culture NO GROWTH Performed at [facility]   Final   Report Status 10/13/2014 FINAL  Final    Medical History: Past Medical History  Diagnosis Date  . GERD (gastroesophageal reflux disease)   . Asthma   . Hx MRSA infection     on face  . Gastroparesis   . Diabetic neuropathy   . Seizures   . Stroke     spinal stroke in 4/15  . Diabetes mellitus     sees Dr. Loanne Drilling   . Family history of anesthesia complication     Pt mother can't have epidural procedures  . Dysrhythmia   . Pneumonia   . Arthritis   . Fibromyalgia     Medications:   (Not in a hospital admission) Assessment: 28 YOM with  paraplegia, presents with a decubitus ulcer that has required extensive debridement. He was being followed by Dr. Tommy Medal prior to admission and was receiving IV vancomycin. Per patient's relative, he was started on Vancomycin 750 mg twice daily and then decreased to 750 mg IV of which patient has received 2 doses so far. Last Vanc dose was last night at 2200. WBC on admission was 24.5. Pharmacy consulted to resume IV vancomycin and start IV Merrem for sepsis/decubitus ulcer. SCr elevated at 1.6. CrCl ~ 60 mL/min although not sure how reliable this is given quadriplegia.   Goal of Therapy:  Vancomycin trough level 15-20 mcg/ml  Plan:  -Resume vancomycin 750 mg IV Q 24 hours starting at 2200 -Start Merrem 1 gm IV Q 8 hours -Monitor renal fx closely. If  SCr continues to trend up, may need to decrease Merrem dose  -Monitor CBC, and clinical progres -VT at prior to third inpatient dose (since pt has already received 2 doses)  Albertina Parr, PharmD., BCPS Clinical Pharmacist Pager [phone]

## 2014-11-09 ENCOUNTER — Encounter (HOSPITAL_COMMUNITY): Payer: Self-pay | Admitting: *Deleted

## 2014-11-09 ENCOUNTER — Inpatient Hospital Stay: Payer: Medicaid Other | Admitting: Internal Medicine

## 2014-11-09 ENCOUNTER — Telehealth: Payer: Self-pay | Admitting: Hematology & Oncology

## 2014-11-09 DIAGNOSIS — A4101 Sepsis due to Methicillin susceptible Staphylococcus aureus: Secondary | ICD-10-CM

## 2014-11-09 DIAGNOSIS — N179 Acute kidney failure, unspecified: Secondary | ICD-10-CM

## 2014-11-09 DIAGNOSIS — L8990 Pressure ulcer of unspecified site, unspecified stage: Secondary | ICD-10-CM | POA: Insufficient documentation

## 2014-11-09 LAB — TYPE AND SCREEN
ABO/RH(D): O POS
Antibody Screen: NEGATIVE

## 2014-11-09 LAB — BASIC METABOLIC PANEL
ANION GAP: 5 (ref 5–15)
BUN: 18 mg/dL (ref 6–23)
CHLORIDE: 111 mmol/L (ref 96–112)
CO2: 20 mmol/L (ref 19–32)
Calcium: 10.2 mg/dL (ref 8.4–10.5)
Creatinine, Ser: 1.36 mg/dL — ABNORMAL HIGH (ref 0.50–1.35)
GFR calc non Af Amer: 69 mL/min — ABNORMAL LOW (ref >90)
GFR, EST AFRICAN AMERICAN: 80 mL/min — AB (ref >90)
GLUCOSE: 164 mg/dL — AB (ref 70–99)
POTASSIUM: 3.9 mmol/L (ref 3.5–5.1)
Sodium: 136 mmol/L (ref 135–145)

## 2014-11-09 LAB — IRON AND TIBC
IRON: 10 ug/dL — AB (ref 42–165)
Saturation Ratios: 6 % — ABNORMAL LOW (ref 20–55)
TIBC: 175 ug/dL — AB (ref 215–435)
UIBC: 165 ug/dL (ref 125–400)

## 2014-11-09 LAB — CBC
HCT: 23 % — ABNORMAL LOW (ref 39.0–52.0)
Hemoglobin: 7.3 g/dL — ABNORMAL LOW (ref 13.0–17.0)
MCH: 26.2 pg (ref 26.0–34.0)
MCHC: 31.7 g/dL (ref 30.0–36.0)
MCV: 82.4 fL (ref 78.0–100.0)
Platelets: 526 10*3/uL — ABNORMAL HIGH (ref 150–400)
RBC: 2.79 MIL/uL — ABNORMAL LOW (ref 4.22–5.81)
RDW: 16.2 % — ABNORMAL HIGH (ref 11.5–15.5)
WBC: 24.1 10*3/uL — ABNORMAL HIGH (ref 4.0–10.5)

## 2014-11-09 LAB — GLUCOSE, CAPILLARY
GLUCOSE-CAPILLARY: 57 mg/dL — AB (ref 70–99)
Glucose-Capillary: 106 mg/dL — ABNORMAL HIGH (ref 70–99)
Glucose-Capillary: 103 mg/dL — ABNORMAL HIGH (ref 70–99)
Glucose-Capillary: 143 mg/dL — ABNORMAL HIGH (ref 70–99)
Glucose-Capillary: 139 mg/dL — ABNORMAL HIGH (ref 70–99)

## 2014-11-09 LAB — FERRITIN: Ferritin: 558 ng/mL — ABNORMAL HIGH (ref 22–322)

## 2014-11-09 LAB — FOLATE: Folate: 10.1 ng/mL

## 2014-11-09 LAB — VITAMIN B12: Vitamin B-12: 858 pg/mL (ref 211–911)

## 2014-11-09 MED ORDER — SODIUM CHLORIDE 0.9 % IV SOLN
INTRAVENOUS | Status: AC
  Administered 2014-11-10: via INTRAVENOUS

## 2014-11-09 MED ORDER — HYDROMORPHONE HCL 1 MG/ML IJ SOLN
1.0000 mg | Freq: Once | INTRAMUSCULAR | Status: AC
  Administered 2014-11-09: 1 mg via INTRAVENOUS
  Filled 2014-11-09: qty 1

## 2014-11-09 MED ORDER — SODIUM CHLORIDE 0.9 % IV BOLUS (SEPSIS)
500.0000 mL | Freq: Once | INTRAVENOUS | Status: AC
  Administered 2014-11-09: 500 mL via INTRAVENOUS

## 2014-11-09 MED ORDER — FENTANYL 25 MCG/HR TD PT72
25.0000 ug | MEDICATED_PATCH | TRANSDERMAL | Status: DC
  Administered 2014-11-09 – 2014-11-16 (×3): 25 ug via TRANSDERMAL
  Filled 2014-11-09 (×3): qty 1

## 2014-11-09 MED ORDER — GI COCKTAIL ~~LOC~~
30.0000 mL | Freq: Three times a day (TID) | ORAL | Status: DC | PRN
  Administered 2014-11-09 (×2): 30 mL via ORAL
  Filled 2014-11-09 (×3): qty 30

## 2014-11-09 MED ORDER — LINEZOLID 600 MG PO TABS
600.0000 mg | ORAL_TABLET | Freq: Two times a day (BID) | ORAL | Status: DC
  Administered 2014-11-09 – 2014-11-15 (×12): 600 mg via ORAL
  Filled 2014-11-09 (×14): qty 1

## 2014-11-09 MED ORDER — DEXTROSE 50 % IV SOLN
INTRAVENOUS | Status: AC
  Administered 2014-11-09: 25 mL
  Filled 2014-11-09: qty 50

## 2014-11-09 MED ORDER — CALCIUM CARBONATE ANTACID 500 MG PO CHEW
400.0000 mg | CHEWABLE_TABLET | Freq: Once | ORAL | Status: DC
  Filled 2014-11-09: qty 2

## 2014-11-09 NOTE — Telephone Encounter (Signed)
noted 

## 2014-11-09 NOTE — Evaluation (Signed)
Physical Therapy Evaluation Patient Details Name: Jason [name] MRN: [id number] DOB: 1985/11/07 Today's Date: 11/09/2014   History of Present Illness  29 y.o. male admitted from home to [facility] on 11/08/14 for decubitus ulcers infection management and osteomyelitis.  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   Clinical Impression  Pt presents with c-level quadriplegia and significant sacral, ischial, and trunkal wounds as well as wounds on feet and fingers.  Mom is managing most of his care at home and pt was [facility] until October of 2015 when the wounds started getting worse.  Per mom, pt has now gained significant weight since he was originally fitted for his electric WC on CIR and the WC is no longer appropriate for his size and his wounds.  I contacted Corene Cornea at [facility] (who fitted him for his original [facility]) to see what we could do about re-assessing him for a WC or making modifications to his current chair.  Per mom, pt will adamantly refuse SNF placement, but was verbally agreeable for me to see if he would be an inpatient rehab candidate.  PT will follow acutely and mobilize as best his pain and wounds allow.  When attempting EOB next session, it may be beneficial (if his air mattress overlay has arrived) to keep the air overlay on to decrease shearing on his bottom.   PT to follow acutely for deficits listed below.       Follow Up Recommendations CIR    Equipment Recommendations  Other (comment) (pt is in need of a new or updated WC.  Left message for stal)    Recommendations for Other Services Rehab consult     Precautions / Restrictions Precautions Precautions: Fall;Other (comment) (multiple wounds) Precaution Comments: Per mom, wound care MD PTA did not want him up  for longer than an hour at a time due to his wounds.       Mobility  Bed Mobility Overal bed mobility: Needs Assistance Bed Mobility: Rolling Rolling: Mod assist         General bed mobility comments: Mod assist to position leg in flexion and to initiate upper extremity and trunk rotation. Pt able to hook with bil arms around bed rails to complete roll bil to assist with upper body and trunk.    Transfers                 General transfer comment: NT due to two person assist needed, pt too painful, BP and Hgb too low today.             Pertinent Vitals/Pain Pain Assessment: Faces Faces Pain Scale: Hurts whole lot Pain Location: per pt he hurst the worst on his buttocks.  Pain Descriptors / Indicators: Burning Pain Intervention(s): Limited activity within patient's tolerance;Monitored during session;Repositioned    Home Living Family/patient expects to be discharged to:: Private residence Living Arrangements: Parent Available Help at Discharge: Family;Available 24 hours/day Type of Home: House Home Access: Level entry     Home Layout: One level Home Equipment: Bedside commode;Hospital bed;Wheelchair - power (air overlay, [employer] fast streamer, [employer])      Prior Function Level of Independence: Needs assistance   Gait / Transfers Assistance Needed: Per mom he has been physically declining since the wounds got worse. She at one point could consistantly slide board transfer him.  She reports he has gained weight and become very deconditioned compared to when he was working with [facility] PT [facility] (October was their last appointment)  ADL's / Homemaking Assistance Needed: total assist        Hand Dominance   Dominant Hand: Left    Extremity/Trunk Assessment   Upper Extremity Assessment: RUE deficits/detail;LUE deficits/detail RUE Deficits / Details: bil arms with only tenodesis grip, per mom no splints or braces for arms, elbow 2/5 flex/ext, shoulder abduction  3-/5 to ~90 degrees bil, multiple finger wounds.  Pt is able to differentiate fingers when touched.  Per mom peripherial neuropathy in hands and feet.     RUE Sensation: history of peripheral neuropathy LUE Deficits / Details: bil arms with only tenodesis grip, per mom no splints or braces for arms, elbow 2/5 flex/ext, shoulder abduction 3-/5 to ~90 degrees bil, multiple finger wounds.  Pt is able to differentiate fingers when touched.  Per mom peripherial neuropathy in hands and feet.     Lower Extremity Assessment: RLE deficits/detail;LLE deficits/detail RLE Deficits / Details: no active movement bil legs, good ROM maintained ankles past neutral, knee WNL, hips only tested in flexion, but WNL.  Mom reports she does frequent ROM to legs.  LLE Deficits / Details: no active movement bil legs, good ROM maintained ankles past neutral, knee WNL, hips only tested in flexion, but WNL.  Mom reports she does frequent ROM to legs.   Cervical / Trunk Assessment: Other exceptions  Communication   Communication: No difficulties;Other (comment) (per mom, weak lung strength- he can't yell if he needs help)  Cognition Arousal/Alertness: Lethargic (PT woke him up for assessment. ) Behavior During Therapy: Restless Overall Cognitive Status: Difficult to assess                      General Comments General comments (skin integrity, edema, etc.): Multiple wounds not only on his sacrum and ischiums, but also on his trunk.           Assessment/Plan    PT Assessment Patient needs continued PT services  PT Diagnosis Acute pain;Quadraplegia;Generalized weakness   PT Problem List Decreased strength;Decreased activity tolerance;Decreased balance;Decreased mobility;Decreased coordination;Impaired sensation;Impaired tone;Pain  PT Treatment Interventions DME instruction;Functional mobility training;Therapeutic activities;Therapeutic exercise;Balance training;Neuromuscular re-education;Patient/family  education;Wheelchair mobility training;Modalities   PT Goals (Current goals can be found in the Care Plan section) Acute Rehab PT Goals Patient Stated Goal: to decrease pain, go home PT Goal Formulation: With patient/family Time For Goal Achievement: 11/23/14 Potential to Achieve Goals: Fair    Frequency Min 3X/week           End of Session   Activity Tolerance: Patient limited by pain Patient left: in bed;with call bell/phone within reach;with family/visitor present (positioned on his right side for rotation (he was on left))           Time: 2952-8413 PT Time Calculation (min) (ACUTE ONLY): 44 min   Charges:   PT Evaluation $Initial PT Evaluation Tier I: 1 Procedure PT Treatments $Therapeutic Activity: 8-22 mins $Self Care/Home Management: 8-22        Tyheem Boughner B. Jaasiel [place], PT, DPT [phone]   11/09/2014, 5:23 PM

## 2014-11-09 NOTE — Consult Note (Addendum)
[facility] for Infectious Disease    Date of Admission:  11/08/2014  Date of Consult:  11/09/2014  Reason for Consult: Worsening decubitus ulcers concern for progressive infection and osteomyelitis underlying them Referring Physician: Dr. Candiss Norse   HPI: Jason [name] is an 29 y.o. male well known to me from prior admissions to [facility].  I first met Jason [name] in November when he was admitted with methicillin sensitive Staphylococcus aureus bacteremia   At that time he had an MRI that showed osteomyelitis under his eschar on the right side. He did not undergo debridement but was followed closely General surgery and had some purulent material drained spontaneous once the eschar was removed while he was receiving hydrotherapy. There also been concern for possible infection of his feet. He was supposed to receive 6-8 weeks of IV antibiotics but only received 4 weeks.   He was again admitted in December to the ICU and MRI done during that admission of pelvis which had shown an enlarging multiloculated fluid collection around his ischial his tuberosity. He was seen again at that time by general surgery but they felt that he did not need debridement of this site.  Ultimately was discharged home on the 18th apparently without any antibiotics at all. He has been readmitted only 5 days later with a septic picture. MRI done had shown  osteomyelitis with increased edema in ischium and pubic ramus vs prior study. No abscess but edema and enhancement in surrounding tissue c/w myositis  He was readmitted on December 23rd and during that admission underwent I and D by Dr. Grandville Silos with bone debridement and material sent for culture. He had been on only vancomycin. The bone culture grew " multiple organisms but no staph or strep."  He is apparent set out on vancomycin alone without more aggressive gram-negative or anaerobic coverage. I do not know what these other multiple organisms might have been  that grew from the bone culture but they were not worked up by microbiology. In any case he was at home receiving IV vancomycin with labs being faxed to me. Last week we had noted a supratherapeutic vancomycin level but the home infusion couple he felt this was due to ill timing of the collection of the vancomycin trough. This week on repeat blood work the serum creatinine was noted to be elevated and his white blood cell count had doubled.  My nurse had called the patient after discussing case with [facility] and myself and found the patient was doing worse with worsening foul-smelling which-year-old from his decub is ulcer.  He had been followed closely by Dr. Margaretha [name] with Plastics in [place] had not been seen for 2 weeks. He had a vacuum dressing overlying his with and that when the winter weather hit and the wound could not be addressed by home health nurse his mother applied wet-to-dry dressings which remained in place for several days due to the poor weather and inability of home health agency to get to the patient's house. In the interim he developed blistering of the skin superior to the wound on Saturday. He also began to develop foul-smelling drainage from the wound and after call from [name] came to the emergencydepartment for admission. He was then placed on meropenem in addition to vancomycin he has been receiving. At present he is fairly confused but has received Dilantin for pain.   Past Medical History  Diagnosis Date  . GERD (gastroesophageal reflux disease)   .  Asthma   . Hx MRSA infection     on face  . Gastroparesis   . Diabetic neuropathy   . Seizures   . Stroke     spinal stroke in 4/15  . Diabetes mellitus     sees Dr. Loanne Drilling   . Family history of anesthesia complication     Pt mother can't have epidural procedures  . Dysrhythmia   . Pneumonia   . Arthritis   . Fibromyalgia     Past Surgical History  Procedure Laterality Date  . Tonsillectomy      . Multiple extractions with alveoloplasty N/A 08/03/2014    Procedure: MULTIPLE EXTRACTIONS;  Surgeon: Gae Bon, DDS;  Location: [place];  Service: Oral Surgery;  Laterality: N/A;  . Tee without cardioversion N/A 08/17/2014    Procedure: TRANSESOPHAGEAL ECHOCARDIOGRAM (TEE);  Surgeon: Dorothy Spark, MD;  Location: [facility] ENDOSCOPY;  Service: Cardiovascular;  Laterality: N/A;  . Debridment of decubitus ulcer N/A 10/04/2014    Procedure: DEBRIDMENT OF DECUBITUS ULCER;  Surgeon: Georganna Skeans, MD;  Location: [place];  Service: General;  Laterality: N/A;  . Laparoscopic diverted colostomy N/A 10/12/2014    Procedure: LAPAROSCOPIC DIVERTING COLOSTOMY;  Surgeon: Donnie Mesa, MD;  Location: [place];  Service: General;  Laterality: N/A;  . Insertion of suprapubic catheter N/A 10/12/2014    Procedure: INSERTION OF SUPRAPUBIC CATHETER;  Surgeon: Reece Packer, MD;  Location: [place];  Service: Urology;  Laterality: N/A;  ergies:   Allergies  Allergen Reactions  . Cefuroxime Axetil Anaphylaxis  . Morphine And Related Other (See Comments)    Changed mental status, confusion, headache, visual hallucination  . Penicillins Anaphylaxis and Other (See Comments)    ?can take amoxicillin?  . Sulfa Antibiotics Anaphylaxis, Shortness Of Breath and Other (See Comments)  . Tessalon [Benzonatate] Anaphylaxis  . Shellfish Allergy Itching and Other (See Comments)    Took benadryl to alleviate reaction     Medications: I have reviewed patients current medications as documented in Epic Anti-infectives    Start     Dose/Rate Route Frequency Ordered Stop   11/09/14 1400  linezolid (ZYVOX) tablet 600 mg     600 mg Oral Every 12 hours 11/09/14 1319     11/09/14 0700  vancomycin (VANCOCIN) 500 mg in sodium chloride 0.9 % 100 mL IVPB  Status:  Discontinued     500 mg100 mL/hr over 60 Minutes Intravenous Every 12 hours 11/08/14 1732 11/08/14 1737   11/08/14 2200  vancomycin (VANCOCIN) IVPB 750 mg/150 ml premix   Status:  Discontinued     750 mg150 mL/hr over 60 Minutes Intravenous Every 24 hours 11/08/14 1737 11/09/14 1319   11/08/14 1800  meropenem (MERREM) 1 g in sodium chloride 0.9 % 100 mL IVPB     1 g200 mL/hr over 30 Minutes Intravenous Every 8 hours 11/08/14 1732     11/08/14 1745  vancomycin (VANCOCIN) 1,250 mg in sodium chloride 0.9 % 250 mL IVPB  Status:  Discontinued     1,250 mg166.7 mL/hr over 90 Minutes Intravenous  Once 11/08/14 1732 11/08/14 1734      Social History:  reports that he has been smoking Cigars and Cigarettes.  He has never used smokeless tobacco. He reports that he does not drink alcohol or use illicit drugs.  Family History  Problem Relation Age of Onset  . Diabetes Father   . Hypertension Father   . Asthma      fhx  . Hypertension  fhx  . Stroke      fhx  . Heart disease Mother     As in HPI and primary teams notes otherwise 12 point review of systems is negative  Blood pressure 100/48, pulse 79, temperature 98.5 F (36.9 C), temperature source Oral, resp. rate 18, height 5' 8"  (1.727 m), weight 136 lb 7.4 oz (61.9 kg), SpO2 99 %. General: quite sleepy and snoring through most of the time that I spoke with his mother he awoke slightly while we examined his decubitus ulcers but was still in a fairly deep sleep. CVS regular rate, normal r,  no murmur rubs or gallops Chest: clear to auscultation bilaterally, no wheezing, rales or rhonchi Abdomen: soft nontender, nondistended, normal bowel sounds,  Skin:   Decubitus ulcers:   Pictures from 09/29/2014:       Pictures from today 11/09/14:      Blistering area on back superior to wound 11/08/14:      Results for orders placed or performed during the hospital encounter of 11/08/14 (from the past 48 hour(s))  Urinalysis, Routine w reflex microscopic     Status: Abnormal   Collection Time: 11/08/14  3:13 PM  Result Value Ref Range   Color, Urine YELLOW YELLOW   APPearance CLOUDY (A) CLEAR    Specific Gravity, Urine 1.016 1.005 - 1.030   pH 5.0 5.0 - 8.0   Glucose, UA NEGATIVE NEGATIVE mg/dL   Hgb urine dipstick SMALL (A) NEGATIVE   Bilirubin Urine NEGATIVE NEGATIVE   Ketones, ur NEGATIVE NEGATIVE mg/dL   Protein, ur 30 (A) NEGATIVE mg/dL   Urobilinogen, UA 0.2 0.0 - 1.0 mg/dL   Nitrite NEGATIVE NEGATIVE   Leukocytes, UA LARGE (A) NEGATIVE  Creatinine, urine, random     Status: None   Collection Time: 11/08/14  3:13 PM  Result Value Ref Range   Creatinine, Urine 75.73 mg/dL  Sodium, urine, random     Status: None   Collection Time: 11/08/14  3:13 PM  Result Value Ref Range   Sodium, Ur 43 mmol/L  Urine microscopic-add on     Status: Abnormal   Collection Time: 11/08/14  3:13 PM  Result Value Ref Range   Squamous Epithelial / LPF FEW (A) RARE   WBC, UA 11-20 <3 WBC/hpf   RBC / HPF 3-6 <3 RBC/hpf   Bacteria, UA FEW (A) RARE   Casts HYALINE CASTS (A) NEGATIVE   Urine-Other MANY YEAST   Comprehensive metabolic panel     Status: Abnormal   Collection Time: 11/08/14  3:34 PM  Result Value Ref Range   Sodium 134 (L) 135 - 145 mmol/L   Potassium 4.0 3.5 - 5.1 mmol/L   Chloride 109 96 - 112 mmol/L   CO2 20 19 - 32 mmol/L   Glucose, Bld 152 (H) 70 - 99 mg/dL   BUN 21 6 - 23 mg/dL   Creatinine, Ser 1.55 (H) 0.50 - 1.35 mg/dL   Calcium 10.6 (H) 8.4 - 10.5 mg/dL   Total Protein 6.8 6.0 - 8.3 g/dL   Albumin 1.9 (L) 3.5 - 5.2 g/dL   AST 10 0 - 37 U/L   ALT 10 0 - 53 U/L   Alkaline Phosphatase 144 (H) 39 - 117 U/L   Total Bilirubin 0.6 0.3 - 1.2 mg/dL   GFR calc non Af Amer 59 (L) >90 mL/min   GFR calc Af Amer 68 (L) >90 mL/min    Comment: (NOTE) The eGFR has been calculated using the CKD EPI  equation. This calculation has not been validated in all clinical situations. eGFR's persistently <90 mL/min signify possible Chronic Kidney Disease.    Anion gap 5 5 - 15  CBC with Differential     Status: Abnormal   Collection Time: 11/08/14  3:34 PM  Result Value Ref Range     WBC 24.5 (H) 4.0 - 10.5 K/uL   RBC 3.08 (L) 4.22 - 5.81 MIL/uL   Hemoglobin 7.9 (L) 13.0 - 17.0 g/dL   HCT 24.7 (L) 39.0 - 52.0 %   MCV 80.2 78.0 - 100.0 fL   MCH 25.6 (L) 26.0 - 34.0 pg   MCHC 32.0 30.0 - 36.0 g/dL   RDW 16.0 (H) 11.5 - 15.5 %   Platelets 508 (H) 150 - 400 K/uL   Neutrophils Relative % 79 (H) 43 - 77 %   Neutro Abs 19.4 (H) 1.7 - 7.7 K/uL   Lymphocytes Relative 6 (L) 12 - 46 %   Lymphs Abs 1.5 0.7 - 4.0 K/uL   Monocytes Relative 14 (H) 3 - 12 %   Monocytes Absolute 3.4 (H) 0.1 - 1.0 K/uL   Eosinophils Relative 1 0 - 5 %   Eosinophils Absolute 0.3 0.0 - 0.7 K/uL   Basophils Relative 0 0 - 1 %   Basophils Absolute 0.0 0.0 - 0.1 K/uL  I-stat Chem 8, ED     Status: Abnormal   Collection Time: 11/08/14  4:46 PM  Result Value Ref Range   Sodium 138 135 - 145 mmol/L   Potassium 4.0 3.5 - 5.1 mmol/L   Chloride 108 96 - 112 mmol/L   BUN 21 6 - 23 mg/dL   Creatinine, Ser 1.60 (H) 0.50 - 1.35 mg/dL   Glucose, Bld 149 (H) 70 - 99 mg/dL   Calcium, Ion 1.63 (H) 1.12 - 1.23 mmol/L   TCO2 17 0 - 100 mmol/L   Hemoglobin 8.8 (L) 13.0 - 17.0 g/dL   HCT 26.0 (L) 39.0 - 52.0 %  Vitamin B12     Status: None   Collection Time: 11/08/14  5:38 PM  Result Value Ref Range   Vitamin B-12 858 211 - 911 pg/mL    Comment: Performed at [facility]  Folate     Status: None   Collection Time: 11/08/14  5:38 PM  Result Value Ref Range   Folate 10.1 ng/mL    Comment: (NOTE) Reference Ranges        Deficient:       0.4 - 3.3 ng/mL        Indeterminate:   3.4 - 5.4 ng/mL        Normal:              > 5.4 ng/mL Performed at [facility]   Iron and TIBC     Status: Abnormal   Collection Time: 11/08/14  5:38 PM  Result Value Ref Range   Iron 10 (L) 42 - 165 ug/dL   TIBC 175 (L) 215 - 435 ug/dL   Saturation Ratios 6 (L) 20 - 55 %   UIBC 165 125 - 400 ug/dL    Comment: Performed at [facility]  Ferritin     Status: Abnormal   Collection Time: 11/08/14   5:38 PM  Result Value Ref Range   Ferritin 558 (H) 22 - 322 ng/mL    Comment: Performed at [facility]  Reticulocytes     Status: Abnormal   Collection Time: 11/08/14  5:38 PM  Result Value Ref Range   Retic Ct Pct 0.9 0.4 - 3.1 %   RBC. 3.00 (L) 4.22 - 5.81 MIL/uL   Retic Count, Manual 27.0 19.0 - 186.0 K/uL  Glucose, capillary     Status: Abnormal   Collection Time: 11/08/14  9:03 PM  Result Value Ref Range   Glucose-Capillary 151 (H) 70 - 99 mg/dL  Basic metabolic panel     Status: Abnormal   Collection Time: 11/09/14  4:25 AM  Result Value Ref Range   Sodium 136 135 - 145 mmol/L   Potassium 3.9 3.5 - 5.1 mmol/L   Chloride 111 96 - 112 mmol/L   CO2 20 19 - 32 mmol/L   Glucose, Bld 164 (H) 70 - 99 mg/dL   BUN 18 6 - 23 mg/dL   Creatinine, Ser 1.36 (H) 0.50 - 1.35 mg/dL   Calcium 10.2 8.4 - 10.5 mg/dL   GFR calc non Af Amer 69 (L) >90 mL/min   GFR calc Af Amer 80 (L) >90 mL/min    Comment: (NOTE) The eGFR has been calculated using the CKD EPI equation. This calculation has not been validated in all clinical situations. eGFR's persistently <90 mL/min signify possible Chronic Kidney Disease.    Anion gap 5 5 - 15  CBC     Status: Abnormal   Collection Time: 11/09/14  4:25 AM  Result Value Ref Range   WBC 24.1 (H) 4.0 - 10.5 K/uL   RBC 2.79 (L) 4.22 - 5.81 MIL/uL   Hemoglobin 7.3 (L) 13.0 - 17.0 g/dL    Comment: REPEATED TO VERIFY DELTA CHECK NOTED    HCT 23.0 (L) 39.0 - 52.0 %   MCV 82.4 78.0 - 100.0 fL   MCH 26.2 26.0 - 34.0 pg   MCHC 31.7 30.0 - 36.0 g/dL   RDW 16.2 (H) 11.5 - 15.5 %   Platelets 526 (H) 150 - 400 K/uL  Glucose, capillary     Status: Abnormal   Collection Time: 11/09/14  7:44 AM  Result Value Ref Range   Glucose-Capillary 106 (H) 70 - 99 mg/dL  Glucose, capillary     Status: Abnormal   Collection Time: 11/09/14 11:58 AM  Result Value Ref Range   Glucose-Capillary 57 (L) 70 - 99 mg/dL  Glucose, capillary     Status: Abnormal    Collection Time: 11/09/14 12:37 PM  Result Value Ref Range   Glucose-Capillary 103 (H) 70 - 99 mg/dL  Type and screen     Status: None   Collection Time: 11/09/14  1:10 PM  Result Value Ref Range   ABO/RH(D) O POS    Antibody Screen NEG    Sample Expiration 11/12/2014    @BRIEFLABTABLE (sdes,specrequest,cult,reptstatus)   ) Recent Results (from the past 720 hour(s))  Culture, Urine     Status: None   Collection Time: 10/11/14  9:00 AM  Result Value Ref Range Status   Specimen Description URINE, RANDOM  Final   Special Requests NONE  Final   Colony Count NO GROWTH Performed at [facility]   Final   Culture NO GROWTH Performed at [facility]   Final   Report Status 10/12/2014 FINAL  Final  Surgical pcr screen     Status: None   Collection Time: 10/12/14  2:38 AM  Result Value Ref Range Status   MRSA, PCR NEGATIVE NEGATIVE Final   Staphylococcus aureus NEGATIVE NEGATIVE Final    Comment:        The Xpert SA  Assay (FDA approved for NASAL specimens in patients over 107 years of age), is one component of a comprehensive surveillance program.  Test performance has been validated by [employer] for patients greater than or equal to 38 year old. It is not intended to diagnose infection nor to guide or monitor treatment.   Urine culture     Status: None   Collection Time: 10/12/14 12:41 PM  Result Value Ref Range Status   Specimen Description URINE, RANDOM CYSTOSCOPE  Final   Special Requests NONE  Final   Colony Count NO GROWTH Performed at [facility]   Final   Culture NO GROWTH Performed at [facility]   Final   Report Status 10/13/2014 FINAL  Final     Impression/Recommendation  Active Problems:   Type 1 diabetes, uncontrolled, with neuropathy   GERD   Gastroparesis due to DM   Protein-calorie malnutrition, severe   Spinal cord infarction (history of)   Tetraplegia   Neurogenic bladder   Sepsis   Severe sepsis    Stage IV decubitus ulcer   Osteomyelitis   Osteomyelitis, pelvis   AKI (acute kidney injury)   Jason [name] is a 29 y.o. male with  Paraplegia, prior MSSA bacteremia, decubitus ulcers, pelvic osteomyelitis sp I and D by Dr Grandville Silos with bone cultures sent in December on vancomycin which grew multiple organisms but no staph or strep. Patient went home on IV vancomycin alone.  In the interim he appears to have developed a blistering of his skin which occurred while his wound had packing material in place for protracted amount of time due to the fact that he did not have a home health nurse to come visit him due to the winter weather. Since that is yet also had worsening foul-smelling drainage from his midline large decubitus ulcer that appears worse on exam today than when I saw him last.  #1 Decubitus ulcers pelvic osteomyelitis:  --would get another MRI of the pelvis with contrast to re-evaluate this area --would consult General Surgery  And Plastic surgery to see him  --I am changing the vancomycin to zyvox (see below)   --I may consider holding merrem if there is a chance we could again get deep cultures from bone and tissue  I spent greater than 25 minutes with the patient including greater than 50% of time in face to face counsel of the patient and in coordination of their care.   #2 Foot: will examined again tomorrow his mother says his foot is doing better since last time I saw him and he is also been seen by Dr. Jacqualyn Posey with podiatry   11/09/2014, 2:38 PM   Thank you so much for this interesting consult  [place] [facility] [place] [phone] (pager) [phone] (office) 11/09/2014, 2:38 PM  Jason [name] 11/09/2014, 2:38 PM

## 2014-11-09 NOTE — Progress Notes (Addendum)
Pt given Morphine 2mg . Pt had no c/o of shortness of breath, skin rash, nausea and vomiting. MD made aware. Order placed for 25 mg of benadryl IV, given. Will continue to monitor pt.

## 2014-11-09 NOTE — Progress Notes (Signed)
Pt bp was 89/58, rechecked manual bp 88/58. Paged MD awaiting callback, will make day RN aware. Will continue to monitor.

## 2014-11-09 NOTE — Progress Notes (Signed)
Pt c/o of stomach pain. MD notified, Orders placed.

## 2014-11-09 NOTE — Progress Notes (Signed)
[facility]  Patient Status: Active pt with [name] and [facility] prior to this readmission  [facility] is providing the following services: [name] is providing [facility] services and [facility] is supporting home IV ABX.  [name] and [facility] team will follow while inpatient to support DC to home when ordered.  If patient discharges after hours, please call [phone].   Larry Sierras 11/09/2014, 8:16 AM

## 2014-11-09 NOTE — Consult Note (Addendum)
WOC wound consult note Reason for Consult: Pt is familiar to the [place] team from multiple previous admissions.  Mom is very familiar with wounds and wound care recommendations, and patient is seen at the outpatient wound clinic for management of blistering areas.  Patient mother reports blistering areas as receiving collagen at the outpatient clinic, however, this is not available within the [facility] system.  Aquacel will be used as a substitute during this admission. Pt mother also helps manage wound VAC and ostomy at home.    Consulted for Multiple decubitus ulcers as well as blistering areas to various parts of the body; Ostomy to LLQ  Wound type: Pressure ulcer located to right ischial area, sacrum, and left buttocks; blistering noted to left middle finger, left heel, left fourth and fifth toe, dorsal area left foot, and large area to back  Pressure Ulcer POA: YES, PRESSURE ULCERS WERE PRESENT UPON ADMISSION  Measurement/Wound Bed/Drainage/Periwound: Stage 4 Sacrum: 8X8X2cm;60% red, 40% yellow slough; bone palpable, odor present, moderate amount of purulent drainage noted when palpated; suspect communicates under the surface of the skin to the ischial wound, periwound boggy  Stage 4 Right Ischium: 8X7.5X3cm;tunneling at 10:00 5cm depth, 85% red, bone visible, minimal yellow slough, no odor, suspect communicates under the surface of the skin to the sacral wound, periwound boggy Stage 2 Left Buttocks: 4X3X0.1cm; scant serosanguinous drainage noted, no odor, periwound reddened but blancheable Stage 3 Left Heel: 2.3X1.7X0.1cm; minimal bloody drainage noted, no odor, periwound reddened but blancheable Full thickness blistering wounds: Left middle finger 1.3X1X0.1cm, 100% red wound bed, minimal bleeding, no odor; left fourth toe: 1.5X1X0.1cm, 100% red wound bed, minimal bleeding, no odor; left fifth toe: 3X2X0.1cm, 100% red wound bed, minimal bleeding, no odor; left anterior foot (above 4th and 5th toes)  1.2X1X0.2cm, 100% red wound bed, minimal bleeding, no odor noted; Back: 17X19X0.1cm, 100% pink wound bed, no drainage or odor noted  Dressing procedure/placement/frequency: Pt endorses receiving treatment for blistering wounds at the outpatient wound clinic, and has had a VAC in place at home to the right ischial and sacral ulcers.  Pt and his mom also self manage his ostomy.  Recommendations:  Full thickness blistering wounds:  Back: Xeroform gauze to entirety of wound and wrap with disposable mesh underwear (skin is fragile and tape may pull skin off).  Change xeroform gauze daily. Left Middle Finger, Left fourth toe, Left fifth toe, Left anterior foot: Cut and apply small squares of aquacel to wound and cover with gauze. Wrap with kerlix. Change dressings daily. Stage Three Left Heel: Cut and apply small square of aquacel to wound.  Reapply foam heel protector.  Wrap with kerlix. Change daily. Stage Two Left Buttocks: Mepilex to wound, change Q days or prn for soiling. Stage Four Sacrum: Wet to dry dressing--moisten kerlix and apply to wound, cover with ABD pad.  Change Daily. Stage Four Right Ischium: Wet to dry dressing--moisten kerlix and apply to wound, cover with ABD pad. Change Daily.  Recommend leaving VAC off until infectious disease sees patient.  Pt is already on IV abx.  There could be an abcess located to the sacral and right ischial areas that the wound VAC could worsen.  Wound has excessive amounts of slough, VAC is not appropriate in areas with greater than 20% of slough present. Recommend CT scan to r/o abcess in this area.  Low air loss bed ordered.  Frequent repositioning encouraged as well.  Patient and mother were very involved in wound assessment and asked questions  and provided input appropriately.  Education provided on dressing change recommendations as well as frequent repositioning.  Both verbalized understanding.     Orson Gear, BSN, RN-BC, Graduate Student  Julien Girt MSN, RN, [place], [place], [place]

## 2014-11-09 NOTE — Telephone Encounter (Signed)
[name] called and cx 11/11/14 apt due to patient being in the hospital

## 2014-11-09 NOTE — Progress Notes (Signed)
Patient Demographics  Jason [name], is a 29 y.o. male, DOB - 1985/10/10, [place] date - 11/08/2014   Admitting Physician Thurnell Lose, MD  Outpatient Primary MD for the patient is [name], MD  LOS - 1   Chief Complaint  Patient presents with  . Abnormal Lab        Subjective:   [facility] today has, No headache, No chest pain,  No Nausea, No new weakness tingling or numbness, No Cough - SOB. Some Chronic abdomen pain.    Assessment & Plan    1. 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 Failed IV Vanco for the last 3 weeks via PICC, monitor blood cultures, placed on IV vancomycin along with Meropenem, he has remote history of MSSA bacteremia. Wound care and ostomy nurses consulted. ID called. Likely will require 6-8 weeks of prolonged IV antibiotics.   2. Protein calorie malnutrition. Continue protein supplementation.   3. Spinal cord infarction causing functional quadriplegia and neurogenic bladder. Supportive care. He has received suprapubic catheter 3 weeks ago by Dr. Vikki Ports, due for change on 5th of February. UA has been obtained follow cultures.   4. History of smoking. Counseled to quit.    5. DM type 1. pending A1c, continue Lantus at sliding scale.  Lab Results  Component Value Date   HGBA1C 10.1* 08/23/2014    CBG (last 3)   Recent Labs  11/08/14 2103 11/09/14 0744  GLUCAP 151* 106*      6. ARF. Hydrate Improving.    7. Anemia. Likely of chronic disease, inconclusive anemia panel. Monitor, type screen.      Code Status: Full  Family Communication: Mother  Disposition Plan: TBD   Procedures     Consults  ID, W Care   Medications  Scheduled Meds: . atorvastatin  20 mg Oral  q1800  . baclofen  10 mg Oral BID WC  . baclofen  20 mg Oral QHS  . calcium carbonate  400 mg of elemental calcium Oral Once  . feeding supplement (GLUCERNA SHAKE)  237 mL Oral TID BM  . feeding supplement (PRO-STAT SUGAR FREE 64)  30 mL Oral Daily  . [START ON 11/10/2014] fentaNYL  25 mcg Transdermal Q72H  . heparin  5,000 Units Subcutaneous 3 times per day  . insulin aspart  0-5 Units Subcutaneous QHS  . insulin aspart  0-9 Units Subcutaneous TID WC  . insulin glargine  25 Units Subcutaneous QHS  . loratadine  10 mg Oral Daily  . meropenem (MERREM) IV  1 g Intravenous Q8H  . pantoprazole  40 mg Oral Daily  . potassium chloride SA  20 mEq Oral Daily  . pregabalin  100 mg Oral TID AC  . pregabalin  200 mg Oral QHS  . vancomycin  750 mg Intravenous Q24H   Continuous Infusions: . sodium chloride 100 mL/hr at 11/08/14 2303   PRN Meds:.ALPRAZolam, bisacodyl, gi cocktail, guaiFENesin-dextromethorphan, HYDROmorphone (DILAUDID) injection, metoCLOPramide, ondansetron **OR** ondansetron (ZOFRAN) IV, oxyCODONE-acetaminophen **AND** oxyCODONE, polyethylene glycol, sodium chloride  DVT Prophylaxis   Heparin   Lab Results  Component Value Date   PLT 526* 11/09/2014    Antibiotics    Anti-infectives    Start  Dose/Rate Route Frequency Ordered Stop   11/09/14 0700  vancomycin (VANCOCIN) 500 mg in sodium chloride 0.9 % 100 mL IVPB  Status:  Discontinued     500 mg100 mL/hr over 60 Minutes Intravenous Every 12 hours 11/08/14 1732 11/08/14 1737   11/08/14 2200  vancomycin (VANCOCIN) IVPB 750 mg/150 ml premix     750 mg150 mL/hr over 60 Minutes Intravenous Every 24 hours 11/08/14 1737     11/08/14 1800  meropenem (MERREM) 1 g in sodium chloride 0.9 % 100 mL IVPB     1 g200 mL/hr over 30 Minutes Intravenous Every 8 hours 11/08/14 1732     11/08/14 1745  vancomycin (VANCOCIN) 1,250 mg in sodium chloride 0.9 % 250 mL IVPB  Status:  Discontinued     1,250 mg166.7 mL/hr over 90 Minutes Intravenous   Once 11/08/14 1732 11/08/14 1734          Objective:   Filed Vitals:   11/08/14 1829 11/08/14 2109 11/09/14 0600 11/09/14 0625  BP:  90/56 89/58 88/58   Pulse:  79    Temp:  97.6 F (36.4 C) 99 F (37.2 C)   TempSrc:  Oral Oral   Resp:  18 18   Height: 5\' 8"  (1.727 m)     Weight: 61.825 kg (136 lb 4.8 oz)  61.9 kg (136 lb 7.4 oz)   SpO2:  100% 100%     Wt Readings from Last 3 Encounters:  11/09/14 61.9 kg (136 lb 7.4 oz)  10/02/14 57 kg (125 lb 10.6 oz)  09/23/14 59.467 kg (131 lb 1.6 oz)     Intake/Output Summary (Last 24 hours) at 11/09/14 1102 Last data filed at 11/09/14 1035  Gross per 24 hour  Intake    240 ml  Output      0 ml  Net    240 ml     Physical Exam  Awake Alert, Oriented X 3, No new F.N deficits, Normal affect [place].AT,PERRAL Supple Neck,No JVD, No cervical lymphadenopathy appriciated.  Symmetrical Chest wall movement, Good air movement bilaterally, CTAB RRR,No Gallops,Rubs or new Murmurs, No Parasternal Heave +ve B.Sounds, Abd Soft, No tenderness, No organomegaly appriciated, No rebound - guarding or rigidity. Colostomy site stable. Suprapubic catheter in place.  No Cyanosis, Clubbing or edema, No new Rash or bruise     Please see W Care note for details on multiple Decubiti    Data Review   Micro Results No results found for this or any previous visit (from the past 240 hour(s)).  Radiology Reports Dg Chest Port 1 View  11/08/2014   CLINICAL DATA:  Cough, elevated white count  EXAM: PORTABLE CHEST - 1 VIEW  COMPARISON:  10/10/2014  FINDINGS: Consolidation in the left lower lobe, similar to prior study. Increasing right medial basilar airspace opacity also noted. Findings compatible with pneumonia. No effusions. Right PICC are remains in place, unchanged.  IMPRESSION: Stable left lower lobe pneumonia. Increasing right medial basilar density, also likely pneumonia.   Electronically Signed   By: Rolm Baptise M.D.   On: 11/08/2014 18:48   Dg  Chest Port 1 View  10/10/2014   CLINICAL DATA:  Pt has fever, chills, and altered mental status. Hx asthma and pneumonia  EXAM: PORTABLE CHEST - 1 VIEW  COMPARISON:  09/29/2014  FINDINGS: Right arm PICC has been placed to the cavoatrial junction. Patchy infiltrate or atelectasis in the inferior lingula, obscuring a portion of the left heart border. Right lung clear. No effusion. Old healed right  clavicle fracture.  IMPRESSION: 1. PICC line to the cavoatrial junction. 2. New lingular infiltrate or atelectasis.   Electronically Signed   By: Arne Cleveland M.D.   On: 10/10/2014 12:26     CBC  Recent Labs Lab 11/08/14 1534 11/08/14 1646 11/09/14 0425  WBC 24.5*  --  24.1*  HGB 7.9* 8.8* 7.3*  HCT 24.7* 26.0* 23.0*  PLT 508*  --  526*  MCV 80.2  --  82.4  MCH 25.6*  --  26.2  MCHC 32.0  --  31.7  RDW 16.0*  --  16.2*  LYMPHSABS 1.5  --   --   MONOABS 3.4*  --   --   EOSABS 0.3  --   --   BASOSABS 0.0  --   --     Chemistries   Recent Labs Lab 11/08/14 1534 11/08/14 1646 11/09/14 0425  NA 134* 138 136  K 4.0 4.0 3.9  CL 109 108 111  CO2 20  --  20  GLUCOSE 152* 149* 164*  BUN 21 21 18   CREATININE 1.55* 1.60* 1.36*  CALCIUM 10.6*  --  10.2  AST 10  --   --   ALT 10  --   --   ALKPHOS 144*  --   --   BILITOT 0.6  --   --    ------------------------------------------------------------------------------------------------------------------ estimated creatinine clearance is 69.5 mL/min (by C-G formula based on Cr of 1.36). ------------------------------------------------------------------------------------------------------------------ No results for input(s): HGBA1C in the last 72 hours. ------------------------------------------------------------------------------------------------------------------ No results for input(s): CHOL, HDL, LDLCALC, TRIG, CHOLHDL, LDLDIRECT in the last 72  hours. ------------------------------------------------------------------------------------------------------------------ No results for input(s): TSH, T4TOTAL, T3FREE, THYROIDAB in the last 72 hours.  Invalid input(s): FREET3 ------------------------------------------------------------------------------------------------------------------  Recent Labs  11/08/14 1738  VITAMINB12 858  FOLATE 10.1  FERRITIN 558*  TIBC 175*  IRON 10*  RETICCTPCT 0.9    Coagulation profile No results for input(s): INR, PROTIME in the last 168 hours.  No results for input(s): DDIMER in the last 72 hours.  Cardiac Enzymes No results for input(s): CKMB, TROPONINI, MYOGLOBIN in the last 168 hours.  Invalid input(s): CK ------------------------------------------------------------------------------------------------------------------ Invalid input(s): POCBNP     Time Spent in minutes   35   SINGH,PRASHANT K M.D on 11/09/2014 at 11:02 AM  Between 7am to 7pm - Pager - [phone]  After 7pm go to [email or web address] - [facility] Office  [phone]

## 2014-11-10 ENCOUNTER — Inpatient Hospital Stay (HOSPITAL_COMMUNITY): Payer: Medicaid Other

## 2014-11-10 DIAGNOSIS — E43 Unspecified severe protein-calorie malnutrition: Secondary | ICD-10-CM

## 2014-11-10 DIAGNOSIS — M869 Osteomyelitis, unspecified: Secondary | ICD-10-CM | POA: Insufficient documentation

## 2014-11-10 DIAGNOSIS — M60009 Infective myositis, unspecified site: Secondary | ICD-10-CM

## 2014-11-10 DIAGNOSIS — M009 Pyogenic arthritis, unspecified: Secondary | ICD-10-CM | POA: Insufficient documentation

## 2014-11-10 DIAGNOSIS — L8990 Pressure ulcer of unspecified site, unspecified stage: Secondary | ICD-10-CM

## 2014-11-10 DIAGNOSIS — R21 Rash and other nonspecific skin eruption: Secondary | ICD-10-CM

## 2014-11-10 LAB — SEDIMENTATION RATE: SED RATE: 87 mm/h — AB (ref 0–16)

## 2014-11-10 LAB — GLUCOSE, CAPILLARY
GLUCOSE-CAPILLARY: 111 mg/dL — AB (ref 70–99)
GLUCOSE-CAPILLARY: 146 mg/dL — AB (ref 70–99)
Glucose-Capillary: 106 mg/dL — ABNORMAL HIGH (ref 70–99)
Glucose-Capillary: 142 mg/dL — ABNORMAL HIGH (ref 70–99)

## 2014-11-10 LAB — CBC
HCT: 21.4 % — ABNORMAL LOW (ref 39.0–52.0)
Hemoglobin: 7 g/dL — ABNORMAL LOW (ref 13.0–17.0)
MCH: 26.4 pg (ref 26.0–34.0)
MCHC: 32.2 g/dL (ref 30.0–36.0)
MCV: 82 fL (ref 78.0–100.0)
PLATELETS: 542 10*3/uL — AB (ref 150–400)
RBC: 2.61 MIL/uL — ABNORMAL LOW (ref 4.22–5.81)
RDW: 16.3 % — ABNORMAL HIGH (ref 11.5–15.5)
WBC: 21.8 10*3/uL — AB (ref 4.0–10.5)

## 2014-11-10 LAB — BASIC METABOLIC PANEL
Anion gap: 2 — ABNORMAL LOW (ref 5–15)
BUN: 12 mg/dL (ref 6–23)
CO2: 22 mmol/L (ref 19–32)
CREATININE: 1.3 mg/dL (ref 0.50–1.35)
Calcium: 10.1 mg/dL (ref 8.4–10.5)
Chloride: 112 mmol/L (ref 96–112)
GFR calc Af Amer: 84 mL/min — ABNORMAL LOW (ref >90)
GFR, EST NON AFRICAN AMERICAN: 72 mL/min — AB (ref >90)
Glucose, Bld: 127 mg/dL — ABNORMAL HIGH (ref 70–99)
Potassium: 3.9 mmol/L (ref 3.5–5.1)
Sodium: 136 mmol/L (ref 135–145)

## 2014-11-10 LAB — C-REACTIVE PROTEIN: CRP: 22.1 mg/dL — ABNORMAL HIGH (ref <0.60)

## 2014-11-10 LAB — HEMOGLOBIN A1C
Hgb A1c MFr Bld: 8.8 % — ABNORMAL HIGH (ref 4.8–5.6)
MEAN PLASMA GLUCOSE: 206 mg/dL

## 2014-11-10 MED ORDER — GADOBENATE DIMEGLUMINE 529 MG/ML IV SOLN
13.0000 mL | Freq: Once | INTRAVENOUS | Status: AC | PRN
  Administered 2014-11-10: 13 mL via INTRAVENOUS

## 2014-11-10 MED ORDER — SALINE SPRAY 0.65 % NA SOLN
1.0000 | NASAL | Status: DC | PRN
  Administered 2014-11-10: 1 via NASAL
  Filled 2014-11-10: qty 44

## 2014-11-10 MED ORDER — OXYCODONE HCL 5 MG PO TABS
5.0000 mg | ORAL_TABLET | Freq: Once | ORAL | Status: AC
  Administered 2014-11-10: 5 mg via ORAL
  Filled 2014-11-10: qty 1

## 2014-11-10 NOTE — Progress Notes (Signed)
Pt morning Hgb 7.0. Schorr,NP made aware. No orders made at this time. We will continue to monitor.

## 2014-11-10 NOTE — Progress Notes (Signed)
Rehab Admissions Coordinator Note:  Patient was screened by Cleatrice Burke for appropriateness for an Inpatient Acute Rehab Consult per PT recommendation  At this time, we are recommending Inpatient Rehab consult if you feel appropriate. Dr Naaman Plummer sees pt as an outpatient.  Cleatrice Burke 11/10/2014, 3:49 PM  I can be reached at [phone].

## 2014-11-10 NOTE — Progress Notes (Signed)
[name] for Infectious Disease    Subjective: C/o back pain   Antibiotics:  Anti-infectives    Start     Dose/Rate Route Frequency Ordered Stop   11/09/14 1400  linezolid (ZYVOX) tablet 600 mg     600 mg Oral Every 12 hours 11/09/14 1319     11/09/14 0700  vancomycin (VANCOCIN) 500 mg in sodium chloride 0.9 % 100 mL IVPB  Status:  Discontinued     500 mg100 mL/hr over 60 Minutes Intravenous Every 12 hours 11/08/14 1732 11/08/14 1737   11/08/14 2200  vancomycin (VANCOCIN) IVPB 750 mg/150 ml premix  Status:  Discontinued     750 mg150 mL/hr over 60 Minutes Intravenous Every 24 hours 11/08/14 1737 11/09/14 1319   11/08/14 1800  meropenem (MERREM) 1 g in sodium chloride 0.9 % 100 mL IVPB     1 g200 mL/hr over 30 Minutes Intravenous Every 8 hours 11/08/14 1732     11/08/14 1745  vancomycin (VANCOCIN) 1,250 mg in sodium chloride 0.9 % 250 mL IVPB  Status:  Discontinued     1,250 mg166.7 mL/hr over 90 Minutes Intravenous  Once 11/08/14 1732 11/08/14 1734      Medications: Scheduled Meds: . atorvastatin  20 mg Oral q1800  . baclofen  10 mg Oral BID WC  . baclofen  20 mg Oral QHS  . calcium carbonate  400 mg of elemental calcium Oral Once  . feeding supplement (GLUCERNA SHAKE)  237 mL Oral TID BM  . feeding supplement (PRO-STAT SUGAR FREE 64)  30 mL Oral Daily  . fentaNYL  25 mcg Transdermal Q72H  . heparin  5,000 Units Subcutaneous 3 times per day  . insulin aspart  0-5 Units Subcutaneous QHS  . insulin aspart  0-9 Units Subcutaneous TID WC  . insulin glargine  25 Units Subcutaneous QHS  . linezolid  600 mg Oral Q12H  . loratadine  10 mg Oral Daily  . meropenem (MERREM) IV  1 g Intravenous Q8H  . pantoprazole  40 mg Oral Daily  . potassium chloride SA  20 mEq Oral Daily  . pregabalin  100 mg Oral TID AC  . pregabalin  200 mg Oral QHS   Continuous Infusions:  PRN Meds:.ALPRAZolam, bisacodyl, gi cocktail, guaiFENesin-dextromethorphan, metoCLOPramide, ondansetron **OR**  ondansetron (ZOFRAN) IV, oxyCODONE-acetaminophen **AND** oxyCODONE, polyethylene glycol, sodium chloride, sodium chloride    Objective: Weight change: -3 lb 8.6 oz (-1.603 kg)  Intake/Output Summary (Last 24 hours) at 11/10/14 2111 Last data filed at 11/10/14 1533  Gross per 24 hour  Intake    370 ml  Output   1200 ml  Net   -830 ml   Blood pressure 130/72, pulse 91, temperature 97.9 F (36.6 C), temperature source Oral, resp. rate 20, height 5\' 8"  (1.727 m), weight 136 lb 7.4 oz (61.9 kg), SpO2 97 %. Temp:  [97.9 F (36.6 C)-99.5 F (37.5 C)] 97.9 F (36.6 C) (02/03 1356) Pulse Rate:  [91-95] 91 (02/03 1356) Resp:  [18-20] 20 (02/03 1356) BP: (83-130)/(47-72) 130/72 mmHg (02/03 1356) SpO2:  [96 %-100 %] 97 % (02/03 1356) Weight:  [136 lb 7.4 oz (61.9 kg)] 136 lb 7.4 oz (61.9 kg) (02/03 0500)  Physical Exam: General: Alert and awake, oriented x3, not in any acute distress. Skin: wound vacuum in place, area where blistered skin has denuded is stable Neuro: paraplegic   CBC:  CBC Latest Ref Rng 11/10/2014 11/09/2014 11/08/2014  WBC 4.0 - 10.5 K/uL 21.8(H) 24.1(H) -  Hemoglobin  13.0 - 17.0 g/dL 7.0(L) 7.3(L) 8.8(L)  Hematocrit 39.0 - 52.0 % 21.4(L) 23.0(L) 26.0(L)  Platelets 150 - 400 K/uL 542(H) 526(H) -       BMET  Recent Labs  11/09/14 0425 11/10/14 0425  NA 136 136  K 3.9 3.9  CL 111 112  CO2 20 22  GLUCOSE 164* 127*  BUN 18 12  CREATININE 1.36* 1.30  CALCIUM 10.2 10.1     Liver Panel   Recent Labs  11/08/14 1534  PROT 6.8  ALBUMIN 1.9*  AST 10  ALT 10  ALKPHOS 144*  BILITOT 0.6       Sedimentation Rate  Recent Labs  11/10/14 0425  ESRSEDRATE 87*   C-Reactive Protein  Recent Labs  11/10/14 0425  CRP 22.1*    Micro Results: Recent Results (from the past 720 hour(s))  Surgical pcr screen     Status: None   Collection Time: 10/12/14  2:38 AM  Result Value Ref Range Status   MRSA, PCR NEGATIVE NEGATIVE Final   Staphylococcus  aureus NEGATIVE NEGATIVE Final    Comment:        The Xpert SA Assay (FDA approved for NASAL specimens in patients over 36 years of age), is one component of a comprehensive surveillance program.  Test performance has been validated by [employer] for patients greater than or equal to 2 year old. It is not intended to diagnose infection nor to guide or monitor treatment.   Urine culture     Status: None   Collection Time: 10/12/14 12:41 PM  Result Value Ref Range Status   Specimen Description URINE, RANDOM CYSTOSCOPE  Final   Special Requests NONE  Final   Colony Count NO GROWTH Performed at [facility]   Final   Culture NO GROWTH Performed at [facility]   Final   Report Status 10/13/2014 FINAL  Final  Urine culture     Status: None (Preliminary result)   Collection Time: 11/08/14  3:13 PM  Result Value Ref Range Status   Specimen Description URINE, CATHETERIZED  Final   Special Requests NONE  Final   Colony Count   Final    >=100,000 COLONIES/ML Performed at [facility]    Culture   Final    [place] Performed at [facility]    Report Status PENDING  Incomplete  Culture, blood (routine x 2)     Status: None (Preliminary result)   Collection Time: 11/08/14  5:25 PM  Result Value Ref Range Status   Specimen Description BLOOD ARM LEFT  Final   Special Requests BOTTLES DRAWN AEROBIC AND ANAEROBIC 10CC  Final   Culture   Final           BLOOD CULTURE RECEIVED NO GROWTH TO DATE CULTURE WILL BE HELD FOR 5 DAYS BEFORE ISSUING A FINAL NEGATIVE REPORT Note: Culture results may be compromised due to an excessive volume of blood received in culture bottles. Performed at [facility]    Report Status PENDING  Incomplete  Culture, blood (routine x 2)     Status: None (Preliminary result)   Collection Time: 11/08/14  5:35 PM  Result Value Ref Range Status   Specimen Description BLOOD ARM LEFT  Final   Special Requests  BOTTLES DRAWN AEROBIC AND ANAEROBIC 10CC  Final   Culture   Final           BLOOD CULTURE RECEIVED NO GROWTH TO DATE CULTURE WILL BE HELD  FOR 5 DAYS BEFORE ISSUING A FINAL NEGATIVE REPORT Performed at [facility]    Report Status PENDING  Incomplete    Studies/Results: Mr Pelvis W Wo Contrast  11/10/2014   CLINICAL DATA:  Quadriplegia. Sacral decubitus ulcer. Sepsis. Foul-smelling discharge from the decubitus ulcer.  EXAM: MRI PELVIS WITHOUT AND WITH CONTRAST  TECHNIQUE: Multiplanar multisequence MR imaging of the pelvis was performed both before and after administration of intravenous contrast.  CONTRAST:  56mL MULTIHANCE GADOBENATE DIMEGLUMINE 529 MG/ML IV SOLN  COMPARISON:  10/02/2014  FINDINGS: Again noted are decubitus ulcers over the ischial tuberosities, right larger than left. The right decubitus ulcer extends down to the ischial tuberosity with underlying marrow edema and cortical irregularity consistent with osteomyelitis. There is mild diffuse edema of the pelvic musculature with mild enhancement. There are interval development of multiple small nonenhancing fluid collections within the right adductor magnus and adductor brevis muscles extending from the right ischial tuberosity most consistent with intramuscular abscesses. The conglomeration of the area measures approximately 4.8 x 2 cm, but each individual collection measures significantly smaller than this. There is a rind of nonenhancement around the wound consistent with necrosis.  There is no marrow signal abnormality involving the left ischial tuberosity.  Bilateral hips demonstrate no fracture, dislocation or avascular necrosis. There is a small right hip joint effusion with mild synovial enhancement. There is no bursa formation. There is generalized edema within the subcutaneous fat throughout the pelvis.  IMPRESSION: 1. Again noted is a decubitus ulcer over the ischial tuberosities bilaterally, right larger than left. The right  decubitus ulcer extends down to the ischial tuberosity with underlying marrow edema, cortical irregularity and enhancement consistent with osteomyelitis similar in appearance to the prior exam. There is mild diffuse edema throughout the pelvic musculature with mild enhancement with interval development of small nonenhancing fluid collections within the right adductor magnus and adductor brevis muscles extending from the right ischial tuberosity most consistent with intramuscular abscesses. 2. Small right joint effusion with mild synovial enhancement. Septic arthritis cannot be excluded. If there is clinical concern recommend arthrocentesis. 3. Decubitus ulcer overlying the left ischial tuberosity without underlying osseous abnormality. 4. There is diffuse mild edema of the pelvic musculature which is likely neurogenic. There is a concern of an element of infectious myositis especially in the right adductor and gluteal compartment.   Electronically Signed   By: Kathreen Devoid   On: 11/10/2014 17:12      Assessment/Plan:  Active Problems:   Type 1 diabetes, uncontrolled, with neuropathy   GERD   Gastroparesis due to DM   Protein-calorie malnutrition, severe   Spinal cord infarction (history of)   Tetraplegia   Neurogenic bladder   Sepsis   Severe sepsis   Stage IV decubitus ulcer   Osteomyelitis   Osteomyelitis, pelvis   AKI (acute kidney injury)   Decubitus ulcer    Jason [name] is a 29 y.o. male with Paraplegia, prior MSSA bacteremia, decubitus ulcers, pelvic osteomyelitis sp I and D by Dr Grandville Silos with bone cultures sent in December on vancomycin which grew multiple organisms but no staph or strep. Patient went home on IV vancomycin alone.  In the interim he appears to have developed a blistering of his skin which occurred while his wound had packing material in place for protracted amount of time due to the fact that he did not have a home health nurse to come visit him due to the  winter weather. Since that is yet also  had worsening foul-smelling drainage from his midline large decubitus ulcer that appears worse on exam today than when I saw him last. MRI done today shows ischial osteomyelitis on the right that is stable on exam, nonenhancing fluid collections withinthe right adductor magnus and adductor brevis muscles extending fromthe right ischial tuberosity consistent with intramuscular Abscesses, Small right joint effusion with mild synovial enhancement, ulcer of the left ischial tuberosity without underlying bone abnormalities, edema of the pelvic musculature.    #1 Decubitus ulcers pelvic osteomyelitis with intrramuscular abscesses;  --would consult General Surgery, Plastics re management of this wound and would  consider debridement of the abscesses, potential bone as well for cullture  --would also consider aspiration of the right hip joint for cell count and differential and consultation with orthopedic surgery  --I have left him on  zyvox and meropenem for now   #2 Blistering rash; I had concern this could have been due to Staph Toxic shock syndrome due to packing in the wound and therefore exchanged his vancomycin for zyvox for toxin inhibition  #3 GNR in urine: hard to know if this is pathogen given his paraplegia      LOS: 2 days   [name] 11/10/2014, 9:11 PM

## 2014-11-10 NOTE — Clinical Social Work Psychosocial (Signed)
Clinical Social Work Department BRIEF PSYCHOSOCIAL ASSESSMENT 11/10/2014  Patient:  Jason [name], Jason [name]     Account Number:  [id number]     Admit date:  11/08/2014  Clinical Social Worker:  Lovey Newcomer  Date/Time:  11/10/2014 01:30 PM  Referred by:  Physician  Date Referred:  11/10/2014 Referred for  SNF Placement   Other Referral:   NA   Interview type:  Patient Other interview type:   Patient and patient's mother interviewed at bedside.    PSYCHOSOCIAL DATA Living Status:  FAMILY Admitted from facility:   Level of care:   Primary support name:  [name] Primary support relationship to patient:  PARENT Degree of support available:   Support is strong.    CURRENT CONCERNS Current Concerns  Post-Acute Placement   Other Concerns:   NA    SOCIAL WORK ASSESSMENT / PLAN CSW met with patient at bedside to complete assessment. [name] [employer] also at bedside. CSW was consulted for SNF placement as backup to CIR admission. Patient was admitted from home where he lives with his mother [employer]. [employer] is able to provide necessary level of care for patient with the assistance from [place]. PT has worked with patient and has recommended CIR. Patient and patient's mother agreeable to this plan but they are unwilling to consider SNF backup. Patient was limited in his engagement with CSW and appeared indifferent about discussing discharge plan. Patient's wife appreciative of visit and will plan to take patient home at discharge if CIR does not admit patient.   Assessment/plan status:  No Further Intervention Required Other assessment/ plan:   NONE   Information/referral to community resources:   NONE    PATIENT'S/FAMILY'S RESPONSE TO PLAN OF CARE: Patient will return home with mother at discharge if CIR does not accept patient. CSW will assist with ambulance transport once discharged.       Liz Beach MSW, [place], [place], [phone]

## 2014-11-10 NOTE — Progress Notes (Signed)
Patient with some mental status changes. Spoke to Dr TAT about findings. Mother at beside and stated that She notice a difference in patient demeanor and he has been waking up screaming. Patient denies hallunination. VSS. Notified Rapid Response as well.

## 2014-11-10 NOTE — Progress Notes (Addendum)
PROGRESS NOTE  Jason [name] YIR:[id number] DOB: 1985/10/21 DOA: 11/08/2014 PCP: Jason Morale, MD  Assessment/Plan: Sepsis  -secondary to sacral wound infection/osteomyelitis -appreciate ID consult -abx per ID -IVF Delirium -secondary to opioids -pt had this issue on previous admissions -fine balance between pain control and delirium -discussed with mother at bedside-->d/c Dilaudid, restart home percocet Pelvic osteomyelitis -Failed IV Vanco for the last 3 weeks via PICC -Continue Zyvox and meropenem -consulted plastic surgery--spoke with Dr. Migdalia [name] -MRI pelvis Protein calorie malnutrition -Continue protein supplement Diabetes mellitus type 1 -Continue Lantus and NovoLog sliding scale - hemoglobin A1c--8.8 Spinal cord infarction history  -Patient has suprapubic catheter placed by Dr. Vikki [name] 3 weeks ago Tobacco abuse -counseled to quit Iron deficiency anemia  -Iron saturation 6%  -Start ferrous sulfate    Family Communication:   Mother updated at beside Disposition Plan:   Remain inpt--likely needs debridement       Procedures/Studies: Dg Chest Port 1 View  11/08/2014   CLINICAL DATA:  Cough, elevated white count  EXAM: PORTABLE CHEST - 1 VIEW  COMPARISON:  10/10/2014  FINDINGS: Consolidation in the left lower lobe, similar to prior study. Increasing right medial basilar airspace opacity also noted. Findings compatible with pneumonia. No effusions. Right PICC are remains in place, unchanged.  IMPRESSION: Stable left lower lobe pneumonia. Increasing right medial basilar density, also likely pneumonia.   Electronically Signed   By: Rolm Baptise M.D.   On: 11/08/2014 18:48         Subjective:  patient is confused but able to answer cautions appropriately. Denies any fevers, chills, chest pain, shortness breath, vomiting,, pain. He has stool in his ostomy. No distress.  Objective: Filed Vitals:   11/10/14 0339 11/10/14 0500 11/10/14 0553 11/10/14  0815  BP: 103/61  98/60 118/72  Pulse:   95 92  Temp:    99.5 F (37.5 C)  TempSrc:    Oral  Resp:   20   Height:      Weight:  61.9 kg (136 lb 7.4 oz)    SpO2:   96% 100%    Intake/Output Summary (Last 24 hours) at 11/10/14 0845 Last data filed at 11/10/14 0423  Gross per 24 hour  Intake    250 ml  Output   1200 ml  Net   -950 ml   Weight change: -1.603 kg (-3 lb 8.6 oz) Exam:   General:  Pt is alert, follows commands appropriately, not in acute distress  HEENT: No icterus, No thrush, [place]/AT  Cardiovascular: RRR, S1/S2, no rubs, no gallops  Respiratory: CTA bilaterally, no wheezing, no crackles, no rhonchi  Abdomen: Soft/+BS, non tender, non distended, no guarding; ostomy with stool    Extremities: No edema, No lymphangitis, No petechiae, No rashes, no synovitis  Data Reviewed: Basic Metabolic Panel:  Recent Labs Lab 11/08/14 1534 11/08/14 1646 11/09/14 0425 11/10/14 0425  NA 134* 138 136 136  K 4.0 4.0 3.9 3.9  CL 109 108 111 112  CO2 20  --  20 22  GLUCOSE 152* 149* 164* 127*  BUN 21 21 18 12   CREATININE 1.55* 1.60* 1.36* 1.30  CALCIUM 10.6*  --  10.2 10.1   Liver Function Tests:  Recent Labs Lab 11/08/14 1534  AST 10  ALT 10  ALKPHOS 144*  BILITOT 0.6  PROT 6.8  ALBUMIN 1.9*   No results for input(s): LIPASE, AMYLASE in the last 168 hours. No results for input(s):  AMMONIA in the last 168 hours. CBC:  Recent Labs Lab 11/08/14 1534 11/08/14 1646 11/09/14 0425 11/10/14 0425  WBC 24.5*  --  24.1* 21.8*  NEUTROABS 19.4*  --   --   --   HGB 7.9* 8.8* 7.3* 7.0*  HCT 24.7* 26.0* 23.0* 21.4*  MCV 80.2  --  82.4 82.0  PLT 508*  --  526* 542*   Cardiac Enzymes: No results for input(s): CKTOTAL, CKMB, CKMBINDEX, TROPONINI in the last 168 hours. BNP: Invalid input(s): POCBNP CBG:  Recent Labs Lab 11/09/14 1158 11/09/14 1237 11/09/14 1634 11/09/14 2207 11/10/14 0755  GLUCAP 57* 103* 143* 139* 106*    Recent Results (from the past  240 hour(s))  Urine culture     Status: None (Preliminary result)   Collection Time: 11/08/14  3:13 PM  Result Value Ref Range Status   Specimen Description URINE, CATHETERIZED  Final   Special Requests NONE  Final   Colony Count   Final    >=100,000 COLONIES/ML Performed at [place] Performed at [facility]    Report Status PENDING  Incomplete     Scheduled Meds: . atorvastatin  20 mg Oral q1800  . baclofen  10 mg Oral BID WC  . baclofen  20 mg Oral QHS  . calcium carbonate  400 mg of elemental calcium Oral Once  . feeding supplement (GLUCERNA SHAKE)  237 mL Oral TID BM  . feeding supplement (PRO-STAT SUGAR FREE 64)  30 mL Oral Daily  . fentaNYL  25 mcg Transdermal Q72H  . heparin  5,000 Units Subcutaneous 3 times per day  . insulin aspart  0-5 Units Subcutaneous QHS  . insulin aspart  0-9 Units Subcutaneous TID WC  . insulin glargine  25 Units Subcutaneous QHS  . linezolid  600 mg Oral Q12H  . loratadine  10 mg Oral Daily  . meropenem (MERREM) IV  1 g Intravenous Q8H  . pantoprazole  40 mg Oral Daily  . potassium chloride SA  20 mEq Oral Daily  . pregabalin  100 mg Oral TID AC  . pregabalin  200 mg Oral QHS   Continuous Infusions: . sodium chloride 75 mL/hr at 11/10/14 0006     Jason Delany, DO  [facility] Pager [phone]  If 7PM-7AM, please contact night-coverage [email or web address] Password [email or web address] 11/10/2014, 8:45 AM   LOS: 2 days

## 2014-11-10 NOTE — Consult Note (Signed)
WOC follow-up: MRI indicates fluid collection to right ischium wound and possible osteomyelitis.Suspect sacrum and right ischium wounds communicate under the surface of the skin. This is beyond [facility] scope of practice.Please consult surgical team for further plan of care. Thank-you, Mora, Morley

## 2014-11-10 NOTE — Progress Notes (Signed)
Physical Therapy Treatment Patient Details Name: Jason [name] MRN: [id number] DOB: 09/12/1986 Today's Date: 11/10/2014    History of Present Illness 29 y.o. male admitted from home to [facility] on 11/08/14 for decubitus ulcers infection management and osteomyelitis.  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     PT Comments    Pt participated well in transition to and tolerating sitting EOB, with emphasis on tolerance and attaining modified (long sitting) lock elbowed UE assist.  Follow Up Recommendations  CIR     Equipment Recommendations  Other (comment) (needs updated w/c--seating system)    Recommendations for Other Services Rehab consult     Precautions / Restrictions Precautions Precaution Comments: Per mom, wound care MD PTA did not want him up for longer than an hour at a time due to his wounds.     Mobility  Bed Mobility Overal bed mobility: Needs Assistance;+2 for physical assistance Bed Mobility: Rolling;Sidelying to Sit;Sit to Sidelying Rolling: Mod assist Sidelying to sit: Max assist;+2 for physical assistance     Sit to sidelying: Max assist;+2 for physical assistance General bed mobility comments: Good arm hook assist to roll and initiation and minimal effort of UE's to assist coming to sit. Assist needed to support his trunk in extension  Transfers                    Ambulation/Gait                 Stairs            Wheelchair Mobility    Modified Rankin (Stroke Patients Only)       Balance Overall balance assessment: Needs assistance Sitting-balance support: Feet supported;Bilateral upper extremity supported Sitting balance-Leahy Scale: Zero Sitting balance - Comments: Worked at EOB x >10 min on upright sitting with UE assist in a  modified elbow locked position bilaterally.  Pt able to lock and extend arms helping to extend trunk, but unable to hold for long without collapse.                            Cognition Arousal/Alertness: Awake/alert Behavior During Therapy: WFL for tasks assessed/performed Overall Cognitive Status: Within Functional Limits for tasks assessed                      Exercises      General Comments General comments (skin integrity, edema, etc.): multiple wounds--sacral, ischial, legs and large back wound than makes handling difficult.      Pertinent Vitals/Pain Pain Assessment: Faces Faces Pain Scale: Hurts whole lot Pain Location: buttock Pain Descriptors / Indicators: Burning Pain Intervention(s): Monitored during session;Repositioned    Home Living                      Prior Function            PT Goals (current goals can now be found in the care plan section) Acute Rehab PT Goals Patient Stated Goal: to decrease pain, go home PT Goal Formulation: With patient/family Time For Goal Achievement: 11/23/14 Potential to Achieve Goals: Fair Progress towards PT goals: Progressing toward goals    Frequency  Min 3X/week    PT Plan Current plan remains appropriate    Co-evaluation  End of Session   Activity Tolerance: Patient limited by pain Patient left: in bed;with call bell/phone within reach;with family/visitor present     Time: 1150-1229 PT Time Calculation (min) (ACUTE ONLY): 39 min  Charges:  $Therapeutic Activity: 38-52 mins                    G Codes:      Beverlyann Broxterman, [name] 11/10/2014, 12:46 PM 11/10/2014  Donnella Sham, PT [phone]  (pager)

## 2014-11-10 NOTE — Progress Notes (Signed)
Per Mom and RN staff patient waking up screaming, delirious.  Then later alert and oriented.  VSS. Per Mom and notes Dr Tat at bedside to assess patient this am. Dilaudid D/c'd Patient currently asleep, mom at bedside. RN to continue to monitor patient mental status. RN to call if assistance needed.

## 2014-11-11 ENCOUNTER — Other Ambulatory Visit: Payer: Medicaid Other | Admitting: Lab

## 2014-11-11 ENCOUNTER — Other Ambulatory Visit: Payer: Self-pay | Admitting: Plastic Surgery

## 2014-11-11 ENCOUNTER — Ambulatory Visit: Payer: Medicaid Other | Admitting: Family

## 2014-11-11 ENCOUNTER — Ambulatory Visit: Payer: Medicaid Other

## 2014-11-11 DIAGNOSIS — A498 Other bacterial infections of unspecified site: Secondary | ICD-10-CM

## 2014-11-11 DIAGNOSIS — L89314 Pressure ulcer of right buttock, stage 4: Secondary | ICD-10-CM

## 2014-11-11 DIAGNOSIS — L89899 Pressure ulcer of other site, unspecified stage: Secondary | ICD-10-CM

## 2014-11-11 DIAGNOSIS — Z9359 Other cystostomy status: Secondary | ICD-10-CM | POA: Insufficient documentation

## 2014-11-11 DIAGNOSIS — N390 Urinary tract infection, site not specified: Secondary | ICD-10-CM

## 2014-11-11 DIAGNOSIS — E1165 Type 2 diabetes mellitus with hyperglycemia: Secondary | ICD-10-CM

## 2014-11-11 DIAGNOSIS — E1040 Type 1 diabetes mellitus with diabetic neuropathy, unspecified: Secondary | ICD-10-CM

## 2014-11-11 DIAGNOSIS — L89154 Pressure ulcer of sacral region, stage 4: Secondary | ICD-10-CM

## 2014-11-11 DIAGNOSIS — G8220 Paraplegia, unspecified: Secondary | ICD-10-CM

## 2014-11-11 DIAGNOSIS — M549 Dorsalgia, unspecified: Secondary | ICD-10-CM

## 2014-11-11 LAB — BASIC METABOLIC PANEL
Anion gap: 3 — ABNORMAL LOW (ref 5–15)
BUN: 12 mg/dL (ref 6–23)
CHLORIDE: 113 mmol/L — AB (ref 96–112)
CO2: 21 mmol/L (ref 19–32)
Calcium: 10.7 mg/dL — ABNORMAL HIGH (ref 8.4–10.5)
Creatinine, Ser: 1.06 mg/dL (ref 0.50–1.35)
GFR calc Af Amer: >90 mL/min (ref >90)
GFR calc non Af Amer: >90 mL/min (ref >90)
Glucose, Bld: 127 mg/dL — ABNORMAL HIGH (ref 70–99)
Potassium: 4.1 mmol/L (ref 3.5–5.1)
Sodium: 137 mmol/L (ref 135–145)

## 2014-11-11 LAB — URINE CULTURE

## 2014-11-11 LAB — CBC
HCT: 22.3 % — ABNORMAL LOW (ref 39.0–52.0)
HEMOGLOBIN: 7.1 g/dL — AB (ref 13.0–17.0)
MCH: 25.7 pg — AB (ref 26.0–34.0)
MCHC: 31.8 g/dL (ref 30.0–36.0)
MCV: 80.8 fL (ref 78.0–100.0)
Platelets: 590 10*3/uL — ABNORMAL HIGH (ref 150–400)
RBC: 2.76 MIL/uL — ABNORMAL LOW (ref 4.22–5.81)
RDW: 16.4 % — AB (ref 11.5–15.5)
WBC: 19.5 10*3/uL — AB (ref 4.0–10.5)

## 2014-11-11 LAB — GLUCOSE, CAPILLARY
GLUCOSE-CAPILLARY: 118 mg/dL — AB (ref 70–99)
Glucose-Capillary: 202 mg/dL — ABNORMAL HIGH (ref 70–99)
Glucose-Capillary: 154 mg/dL — ABNORMAL HIGH (ref 70–99)
Glucose-Capillary: 158 mg/dL — ABNORMAL HIGH (ref 70–99)

## 2014-11-11 NOTE — Progress Notes (Signed)
PROGRESS NOTE  Jason [name] QRF:[id number] DOB: Jun 20, 1986 DOA: 11/08/2014 PCP: [name], MD  Assessment/Plan: Sepsis  -secondary to sacral wound infection/pelvic osteomyelitis/intramuscular abscesses -appreciate ID consult -abx per ID -IVF -stool for Cdiff Delirium -secondary to opioids -More alert since discontinuation of intravenous opioids -pt had this issue on previous admissions -fine balance between pain control and delirium -discussed with mother at bedside-->d/c Dilaudid, restart home percocet Pelvic osteomyelitis/intramuscular abscess -Failed IV Vanco for the last 3 weeks via PICC -Continue Zyvox and meropenem -consulted plastic surgery--appreciate Dr. Henrene Hawking noted for debridement 11/12/14 -MRI pelvis--right if she'll tuberosity with marrow edema, intramuscular abscesses, small right joint effusion with enhancement -consulted ortho--Dr. Marlou Sa for opinion on debridement of right hip Burkholderia Bacteruria -unusual organism in urine -will speak with pt's urologist regarding feasibility of changing suprapubic catheter AKI -secondary to sepsis -improving Protein calorie malnutrition -Continue protein supplement Diabetes mellitus type 1 -Continue Lantus and NovoLog sliding scale - hemoglobin A1c--8.8 Spinal cord infarction history  -Patient has suprapubic catheter placed by Dr. Vikki Ports 3 weeks ago Tobacco abuse -counseled to quit Iron deficiency anemia  -Iron saturation 6%  -Start ferrous sulfate    Family Communication:   Mother updated at beside Disposition Plan:   Home when medically stable      Procedures/Studies: Mr Pelvis W Wo Contrast  11/10/2014   CLINICAL DATA:  Quadriplegia. Sacral decubitus ulcer. Sepsis. Foul-smelling discharge from the decubitus ulcer.  EXAM: MRI PELVIS WITHOUT AND WITH CONTRAST  TECHNIQUE: Multiplanar multisequence MR imaging of the pelvis was performed both before and after administration of  intravenous contrast.  CONTRAST:  30mL MULTIHANCE GADOBENATE DIMEGLUMINE 529 MG/ML IV SOLN  COMPARISON:  10/02/2014  FINDINGS: Again noted are decubitus ulcers over the ischial tuberosities, right larger than left. The right decubitus ulcer extends down to the ischial tuberosity with underlying marrow edema and cortical irregularity consistent with osteomyelitis. There is mild diffuse edema of the pelvic musculature with mild enhancement. There are interval development of multiple small nonenhancing fluid collections within the right adductor magnus and adductor brevis muscles extending from the right ischial tuberosity most consistent with intramuscular abscesses. The conglomeration of the area measures approximately 4.8 x 2 cm, but each individual collection measures significantly smaller than this. There is a rind of nonenhancement around the wound consistent with necrosis.  There is no marrow signal abnormality involving the left ischial tuberosity.  Bilateral hips demonstrate no fracture, dislocation or avascular necrosis. There is a small right hip joint effusion with mild synovial enhancement. There is no bursa formation. There is generalized edema within the subcutaneous fat throughout the pelvis.  IMPRESSION: 1. Again noted is a decubitus ulcer over the ischial tuberosities bilaterally, right larger than left. The right decubitus ulcer extends down to the ischial tuberosity with underlying marrow edema, cortical irregularity and enhancement consistent with osteomyelitis similar in appearance to the prior exam. There is mild diffuse edema throughout the pelvic musculature with mild enhancement with interval development of small nonenhancing fluid collections within the right adductor magnus and adductor brevis muscles extending from the right ischial tuberosity most consistent with intramuscular abscesses. 2. Small right joint effusion with mild synovial enhancement. Septic arthritis cannot be excluded. If  there is clinical concern recommend arthrocentesis. 3. Decubitus ulcer overlying the left ischial tuberosity without underlying osseous abnormality. 4. There is diffuse mild edema of the pelvic musculature which is likely neurogenic. There is a concern of an element of infectious myositis especially in  the right adductor and gluteal compartment.   Electronically Signed   By: Kathreen Devoid   On: 11/10/2014 17:12   Dg Chest Port 1 View  11/08/2014   CLINICAL DATA:  Cough, elevated white count  EXAM: PORTABLE CHEST - 1 VIEW  COMPARISON:  10/10/2014  FINDINGS: Consolidation in the left lower lobe, similar to prior study. Increasing right medial basilar airspace opacity also noted. Findings compatible with pneumonia. No effusions. Right PICC are remains in place, unchanged.  IMPRESSION: Stable left lower lobe pneumonia. Increasing right medial basilar density, also likely pneumonia.   Electronically Signed   By: Rolm Baptise M.D.   On: 11/08/2014 18:48         Subjective:  patient complains of intermittent abdominal discomfort. He is having regular stools. Denies any fevers, chills, chest pain, shortness breath, nausea, vomiting, rashes, headache.   Objective: Filed Vitals:   11/10/14 2204 11/11/14 0621 11/11/14 0625 11/11/14 1424  BP: 117/71 94/70  96/65  Pulse: 95 78  81  Temp: 98.3 F (36.8 C) 98.4 F (36.9 C)  98.5 F (36.9 C)  TempSrc: Oral Oral  Oral  Resp: 14 15  16   Height:      Weight:   70.4 kg (155 lb 3.3 oz)   SpO2: 100% 100%  100%    Intake/Output Summary (Last 24 hours) at 11/11/14 1834 Last data filed at 11/11/14 1739  Gross per 24 hour  Intake    392 ml  Output   2000 ml  Net  -1608 ml   Weight change: 8.5 kg (18 lb 11.8 oz) Exam:   General:  Pt is alert, follows commands appropriately, not in acute distress  HEENT: No icterus, No thrush,/AT  Cardiovascular: RRR, S1/S2, no rubs, no gallops  Respiratory: CTA bilaterally, no wheezing, no crackles, no  rhonchi  Abdomen: Soft/+BS, , non upper abdominal pain without any guarding or rebound distended, no guarding  Extremities: No edema, No lymphangitis, No petechiae, No rashes, no synovitis  Data Reviewed: Basic Metabolic Panel:  Recent Labs Lab 11/08/14 1534 11/08/14 1646 11/09/14 0425 11/10/14 0425 11/11/14 0500  NA 134* 138 136 136 137  K 4.0 4.0 3.9 3.9 4.1  CL 109 108 111 112 113*  CO2 20  --  20 22 21   GLUCOSE 152* 149* 164* 127* 127*  BUN 21 21 18 12 12   CREATININE 1.55* 1.60* 1.36* 1.30 1.06  CALCIUM 10.6*  --  10.2 10.1 10.7*   Liver Function Tests:  Recent Labs Lab 11/08/14 1534  AST 10  ALT 10  ALKPHOS 144*  BILITOT 0.6  PROT 6.8  ALBUMIN 1.9*   No results for input(s): LIPASE, AMYLASE in the last 168 hours. No results for input(s): AMMONIA in the last 168 hours. CBC:  Recent Labs Lab 11/08/14 1534 11/08/14 1646 11/09/14 0425 11/10/14 0425 11/11/14 0500  WBC 24.5*  --  24.1* 21.8* 19.5*  NEUTROABS 19.4*  --   --   --   --   HGB 7.9* 8.8* 7.3* 7.0* 7.1*  HCT 24.7* 26.0* 23.0* 21.4* 22.3*  MCV 80.2  --  82.4 82.0 80.8  PLT 508*  --  526* 542* 590*   Cardiac Enzymes: No results for input(s): CKTOTAL, CKMB, CKMBINDEX, TROPONINI in the last 168 hours. BNP: Invalid input(s): POCBNP CBG:  Recent Labs Lab 11/10/14 1721 11/10/14 2209 11/11/14 0805 11/11/14 1211 11/11/14 1713  GLUCAP 142* 146* 118* 202* 154*    Recent Results (from the past 240 hour(s))  Urine  culture     Status: None   Collection Time: 11/08/14  3:13 PM  Result Value Ref Range Status   Specimen Description URINE, CATHETERIZED  Final   Special Requests NONE  Final   Colony Count   Final    >=100,000 COLONIES/ML Performed at [place]   Final    BURKHOLDERIA CEPACIA Performed at [facility]    Report Status 11/11/2014 FINAL  Final   Organism ID, Bacteria BURKHOLDERIA CEPACIA  Final      Susceptibility   Burkholderia cepacia - MIC*     LEVOFLOXACIN 4 INTERMEDIATE Intermediate     TRIMETH/SULFA 160 RESISTANT Resistant     CEFTAZIDIME 8 SENSITIVE Sensitive     * BURKHOLDERIA CEPACIA  Culture, blood (routine x 2)     Status: None (Preliminary result)   Collection Time: 11/08/14  5:25 PM  Result Value Ref Range Status   Specimen Description BLOOD ARM LEFT  Final   Special Requests BOTTLES DRAWN AEROBIC AND ANAEROBIC 10CC  Final   Culture   Final           BLOOD CULTURE RECEIVED NO GROWTH TO DATE CULTURE WILL BE HELD FOR 5 DAYS BEFORE ISSUING A FINAL NEGATIVE REPORT Note: Culture results may be compromised due to an excessive volume of blood received in culture bottles. Performed at [facility]    Report Status PENDING  Incomplete  Culture, blood (routine x 2)     Status: None (Preliminary result)   Collection Time: 11/08/14  5:35 PM  Result Value Ref Range Status   Specimen Description BLOOD ARM LEFT  Final   Special Requests BOTTLES DRAWN AEROBIC AND ANAEROBIC 10CC  Final   Culture   Final           BLOOD CULTURE RECEIVED NO GROWTH TO DATE CULTURE WILL BE HELD FOR 5 DAYS BEFORE ISSUING A FINAL NEGATIVE REPORT Performed at [facility]    Report Status PENDING  Incomplete     Scheduled Meds: . atorvastatin  20 mg Oral q1800  . baclofen  10 mg Oral BID WC  . baclofen  20 mg Oral QHS  . feeding supplement (GLUCERNA SHAKE)  237 mL Oral TID BM  . feeding supplement (PRO-STAT SUGAR FREE 64)  30 mL Oral Daily  . fentaNYL  25 mcg Transdermal Q72H  . heparin  5,000 Units Subcutaneous 3 times per day  . insulin aspart  0-5 Units Subcutaneous QHS  . insulin aspart  0-9 Units Subcutaneous TID WC  . insulin glargine  25 Units Subcutaneous QHS  . linezolid  600 mg Oral Q12H  . loratadine  10 mg Oral Daily  . meropenem (MERREM) IV  1 g Intravenous Q8H  . pantoprazole  40 mg Oral Daily  . potassium chloride SA  20 mEq Oral Daily  . pregabalin  100 mg Oral TID AC  . pregabalin  200 mg Oral QHS    Continuous Infusions:    Kathi Dohn, DO  [facility] Pager [phone]  If 7PM-7AM, please contact night-coverage [email or web address] Password [facility] 11/11/2014, 6:34 PM   LOS: 3 days

## 2014-11-11 NOTE — Progress Notes (Signed)
[facility]    Subjective: C/o back pain   Antibiotics:  Anti-infectives    Start     Dose/Rate Route Frequency Ordered Stop   11/09/14 1400  linezolid (ZYVOX) tablet 600 mg     600 mg Oral Every 12 hours 11/09/14 1319     11/09/14 0700  vancomycin (VANCOCIN) 500 mg in sodium chloride 0.9 % 100 mL IVPB  Status:  Discontinued     500 mg100 mL/hr over 60 Minutes Intravenous Every 12 hours 11/08/14 1732 11/08/14 1737   11/08/14 2200  vancomycin (VANCOCIN) IVPB 750 mg/150 ml premix  Status:  Discontinued     750 mg150 mL/hr over 60 Minutes Intravenous Every 24 hours 11/08/14 1737 11/09/14 1319   11/08/14 1800  meropenem (MERREM) 1 g in sodium chloride 0.9 % 100 mL IVPB     1 g200 mL/hr over 30 Minutes Intravenous Every 8 hours 11/08/14 1732     11/08/14 1745  vancomycin (VANCOCIN) 1,250 mg in sodium chloride 0.9 % 250 mL IVPB  Status:  Discontinued     1,250 mg166.7 mL/hr over 90 Minutes Intravenous  Once 11/08/14 1732 11/08/14 1734      Medications: Scheduled Meds: . atorvastatin  20 mg Oral q1800  . baclofen  10 mg Oral BID WC  . baclofen  20 mg Oral QHS  . feeding supplement (GLUCERNA SHAKE)  237 mL Oral TID BM  . feeding supplement (PRO-STAT SUGAR FREE 64)  30 mL Oral Daily  . fentaNYL  25 mcg Transdermal Q72H  . heparin  5,000 Units Subcutaneous 3 times per day  . insulin aspart  0-5 Units Subcutaneous QHS  . insulin aspart  0-9 Units Subcutaneous TID WC  . insulin glargine  25 Units Subcutaneous QHS  . linezolid  600 mg Oral Q12H  . loratadine  10 mg Oral Daily  . meropenem (MERREM) IV  1 g Intravenous Q8H  . pantoprazole  40 mg Oral Daily  . potassium chloride SA  20 mEq Oral Daily  . pregabalin  100 mg Oral TID AC  . pregabalin  200 mg Oral QHS   Continuous Infusions:  PRN Meds:.ALPRAZolam, bisacodyl, gi cocktail, guaiFENesin-dextromethorphan, metoCLOPramide, ondansetron **OR** ondansetron (ZOFRAN) IV, oxyCODONE-acetaminophen **AND**  oxyCODONE, polyethylene glycol, sodium chloride, sodium chloride    Objective: Weight change: 18 lb 11.8 oz (8.5 kg)  Intake/Output Summary (Last 24 hours) at 11/11/14 1643 Last data filed at 11/11/14 1135  Gross per 24 hour  Intake    272 ml  Output   1500 ml  Net  -1228 ml   Blood pressure 96/65, pulse 81, temperature 98.5 F (36.9 C), temperature source Oral, resp. rate 16, height 5\' 8"  (1.727 m), weight 155 lb 3.3 oz (70.4 kg), SpO2 100 %. Temp:  [98.3 F (36.8 C)-98.5 F (36.9 C)] 98.5 F (36.9 C) (02/04 1424) Pulse Rate:  [78-95] 81 (02/04 1424) Resp:  [14-17] 16 (02/04 1424) BP: (94-117)/(65-71) 96/65 mmHg (02/04 1424) SpO2:  [100 %] 100 % (02/04 1424) Weight:  [155 lb 3.3 oz (70.4 kg)] 155 lb 3.3 oz (70.4 kg) (02/04 9371)  Physical Exam: General: Alert and awake, oriented x3, not in any acute distress. Skin: wound vacuum in place, area where blistered skin has denuded is stable Neuro: paraplegic   CBC:  CBC Latest Ref Rng 11/11/2014 11/10/2014 11/09/2014  WBC 4.0 - 10.5 K/uL 19.5(H) 21.8(H) 24.1(H)  Hemoglobin 13.0 - 17.0 g/dL 7.1(L) 7.0(L) 7.3(L)  Hematocrit 39.0 - 52.0 % 22.3(L)  21.4(L) 23.0(L)  Platelets 150 - 400 K/uL 590(H) 542(H) 526(H)       BMET  Recent Labs  11/10/14 0425 11/11/14 0500  NA 136 137  K 3.9 4.1  CL 112 113*  CO2 22 21  GLUCOSE 127* 127*  BUN 12 12  CREATININE 1.30 1.06  CALCIUM 10.1 10.7*     Liver Panel  No results for input(s): PROT, ALBUMIN, AST, ALT, ALKPHOS, BILITOT, BILIDIR, IBILI in the last 72 hours.     Sedimentation Rate  Recent Labs  11/10/14 0425  ESRSEDRATE 87*   C-Reactive Protein  Recent Labs  11/10/14 0425  CRP 22.1*    Micro Results: Recent Results (from the past 720 hour(s))  Urine culture     Status: None   Collection Time: 11/08/14  3:13 PM  Result Value Ref Range Status   Specimen Description URINE, CATHETERIZED  Final   Special Requests NONE  Final   Colony Count   Final     >=100,000 COLONIES/ML Performed at [place] Performed at [facility]    Report Status 11/11/2014 FINAL  Final   Organism ID, Bacteria BURKHOLDERIA CEPACIA  Final      Susceptibility   Burkholderia cepacia - MIC*    LEVOFLOXACIN 4 INTERMEDIATE Intermediate     TRIMETH/SULFA 160 RESISTANT Resistant     CEFTAZIDIME 8 SENSITIVE Sensitive     * BURKHOLDERIA CEPACIA  Culture, blood (routine x 2)     Status: None (Preliminary result)   Collection Time: 11/08/14  5:25 PM  Result Value Ref Range Status   Specimen Description BLOOD ARM LEFT  Final   Special Requests BOTTLES DRAWN AEROBIC AND ANAEROBIC 10CC  Final   Culture   Final           BLOOD CULTURE RECEIVED NO GROWTH TO DATE CULTURE WILL BE HELD FOR 5 DAYS BEFORE ISSUING A FINAL NEGATIVE REPORT Note: Culture results may be compromised due to an excessive volume of blood received in culture bottles. Performed at [facility]    Report Status PENDING  Incomplete  Culture, blood (routine x 2)     Status: None (Preliminary result)   Collection Time: 11/08/14  5:35 PM  Result Value Ref Range Status   Specimen Description BLOOD ARM LEFT  Final   Special Requests BOTTLES DRAWN AEROBIC AND ANAEROBIC 10CC  Final   Culture   Final           BLOOD CULTURE RECEIVED NO GROWTH TO DATE CULTURE WILL BE HELD FOR 5 DAYS BEFORE ISSUING A FINAL NEGATIVE REPORT Performed at [facility]    Report Status PENDING  Incomplete    Studies/Results: Mr Pelvis W Wo Contrast  11/10/2014   CLINICAL DATA:  Quadriplegia. Sacral decubitus ulcer. Sepsis. Foul-smelling discharge from the decubitus ulcer.  EXAM: MRI PELVIS WITHOUT AND WITH CONTRAST  TECHNIQUE: Multiplanar multisequence MR imaging of the pelvis was performed both before and after administration of intravenous contrast.  CONTRAST:  9mL MULTIHANCE GADOBENATE DIMEGLUMINE 529 MG/ML IV SOLN  COMPARISON:  10/02/2014  FINDINGS:  Again noted are decubitus ulcers over the ischial tuberosities, right larger than left. The right decubitus ulcer extends down to the ischial tuberosity with underlying marrow edema and cortical irregularity consistent with osteomyelitis. There is mild diffuse edema of the pelvic musculature with mild enhancement. There are interval development of multiple small nonenhancing fluid collections within the right adductor magnus  and adductor brevis muscles extending from the right ischial tuberosity most consistent with intramuscular abscesses. The conglomeration of the area measures approximately 4.8 x 2 cm, but each individual collection measures significantly smaller than this. There is a rind of nonenhancement around the wound consistent with necrosis.  There is no marrow signal abnormality involving the left ischial tuberosity.  Bilateral hips demonstrate no fracture, dislocation or avascular necrosis. There is a small right hip joint effusion with mild synovial enhancement. There is no bursa formation. There is generalized edema within the subcutaneous fat throughout the pelvis.  IMPRESSION: 1. Again noted is a decubitus ulcer over the ischial tuberosities bilaterally, right larger than left. The right decubitus ulcer extends down to the ischial tuberosity with underlying marrow edema, cortical irregularity and enhancement consistent with osteomyelitis similar in appearance to the prior exam. There is mild diffuse edema throughout the pelvic musculature with mild enhancement with interval development of small nonenhancing fluid collections within the right adductor magnus and adductor brevis muscles extending from the right ischial tuberosity most consistent with intramuscular abscesses. 2. Small right joint effusion with mild synovial enhancement. Septic arthritis cannot be excluded. If there is clinical concern recommend arthrocentesis. 3. Decubitus ulcer overlying the left ischial tuberosity without underlying  osseous abnormality. 4. There is diffuse mild edema of the pelvic musculature which is likely neurogenic. There is a concern of an element of infectious myositis especially in the right adductor and gluteal compartment.   Electronically Signed   By: Kathreen Devoid   On: 11/10/2014 17:12      Assessment/Plan:  Active Problems:   Type 1 diabetes, uncontrolled, with neuropathy   GERD   Gastroparesis due to DM   Protein-calorie malnutrition, severe   Spinal cord infarction (history of)   Tetraplegia   Neurogenic bladder   Sepsis   Severe sepsis   Stage IV decubitus ulcer   Osteomyelitis   Osteomyelitis, pelvis   AKI (acute kidney injury)   Decubitus ulcer   Osteomyelitis, pelvic region and thigh   Septic hip    Jason [name] is a 29 y.o. male with Paraplegia, prior MSSA bacteremia, decubitus ulcers, pelvic osteomyelitis sp I and D by Dr Grandville Silos with bone cultures sent in December on vancomycin which grew multiple organisms but no staph or strep. Patient went home on IV vancomycin alone.  In the interim he appears to have developed a blistering of his skin which occurred while his wound had packing material in place for protracted amount of time due to the fact that he did not have a home health nurse to come visit him due to the winter weather. Since that is yet also had worsening foul-smelling drainage from his midline large decubitus ulcer that appears worse on exam today than when I saw him last. MRI done today shows ischial osteomyelitis on the right that is stable on exam, nonenhancing fluid collections withinthe right adductor magnus and adductor brevis muscles extending fromthe right ischial tuberosity consistent with intramuscular Abscesses, Small right joint effusion with mild synovial enhancement, ulcer of the left ischial tuberosity without underlying bone abnormalities, edema of the pelvic musculature.    #1 Decubitus ulcers pelvic osteomyelitis with intrramuscular  abscesses: GREATLY appreciate Dr. Migdalia Dk taking patient to the OR tomorrow for surgery  --would also consider aspiration of the right hip joint for cell count and differential and consultation with orthopedic surgery  --I have left him on  zyvox and meropenem for now   #2 Blistering rash; I had concern  this could have been due to Staph Toxic shock syndrome due to packing in the wound and therefore exchanged his vancomycin for zyvox for toxin inhibition, more simple explanation Plastics suggested this could be burn from a heating pad  #3 Burkholderia cepacia UTI:   This is a MDR pathogen typically and NOT typically found in urine but rather in the lungs of CF patients, Pol[facility] immunocompromised patients. I did find right report of an outbreak associated with lubricating jelly was used to insert catheters.  --I wouldwith Dr. McDiarmid with Urology to consider whether worth exchanging this suprapubic catheter during this admission (it was due to be replaced in 2 weeks)      LOS: 3 days   [name] 11/11/2014, 4:43 PM

## 2014-11-11 NOTE — Consult Note (Signed)
Reason for Consult:Sacral ulcers Referring Physician: Primary team  Jason [name] is an 29 y.o. male.  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  He has been dealing with sacral / ischial ulcers for several months.  The areas seemed to get worse recently and mom was concerned about the smell and size.  He has a burn looking area on his back that looks like a heating bad burn but the family does not think this was the cause.  He has multiple medical problems contributing to his overall status.  The ulcers on the sacral ischial area are ~ 5 cm and track to bone with non viable tissue at the base.  Past Medical History  Diagnosis Date  . GERD (gastroesophageal reflux disease)   . Asthma   . Hx MRSA infection     on face  . Gastroparesis   . Diabetic neuropathy   . Seizures   . Stroke     spinal stroke in 4/15  . Diabetes mellitus     sees Dr. Loanne Drilling   . Family history of anesthesia complication     Pt mother can't have epidural procedures  . Dysrhythmia   . Pneumonia   . Arthritis   . Fibromyalgia     Past Surgical History  Procedure Laterality Date  . Tonsillectomy    . Multiple extractions with alveoloplasty N/A 08/03/2014    Procedure: MULTIPLE EXTRACTIONS;  Surgeon: Gae Bon, DDS;  Location: [place];  Service: Oral Surgery;  Laterality: N/A;  . Tee without cardioversion N/A 08/17/2014    Procedure: TRANSESOPHAGEAL ECHOCARDIOGRAM (TEE);  Surgeon: Dorothy Spark, MD;  Location: [facility] ENDOSCOPY;  Service: Cardiovascular;  Laterality: N/A;  . Debridment of decubitus ulcer N/A 10/04/2014    Procedure: DEBRIDMENT OF DECUBITUS ULCER;  Surgeon: Georganna Skeans, MD;  Location: [place];  Service: General;  Laterality: N/A;  . Laparoscopic diverted colostomy N/A 10/12/2014    Procedure: LAPAROSCOPIC DIVERTING COLOSTOMY;  Surgeon: Donnie Mesa, MD;  Location: [place];  Service: General;  Laterality: N/A;  . Insertion of suprapubic  catheter N/A 10/12/2014    Procedure: INSERTION OF SUPRAPUBIC CATHETER;  Surgeon: Reece Packer, MD;  Location: [place];  Service: Urology;  Laterality: N/A;    Family History  Problem Relation Age of Onset  . Diabetes Father   . Hypertension Father   . Asthma      fhx  . Hypertension      fhx  . Stroke      fhx  . Heart disease Mother     Social History:  reports that he has been smoking Cigars and Cigarettes.  He has never used smokeless tobacco. He reports that he does not drink alcohol or use illicit drugs.  Allergies:  Allergies  Allergen Reactions  . Cefuroxime Axetil Anaphylaxis  . Morphine And Related Other (See Comments)    Changed mental status, confusion, headache, visual hallucination  . Penicillins Anaphylaxis and Other (See Comments)    ?can take amoxicillin?  . Sulfa Antibiotics Anaphylaxis, Shortness Of Breath and Other (See Comments)  . Tessalon [Benzonatate] Anaphylaxis  . Shellfish Allergy Itching and Other (See Comments)    Took benadryl to alleviate reaction    Medications: I have reviewed the patient's current medications.  Results for orders placed or performed during the hospital encounter of 11/08/14 (from the past 48 hour(s))  Glucose, capillary     Status: Abnormal  Collection Time: 11/09/14  4:34 PM  Result Value Ref Range   Glucose-Capillary 143 (H) 70 - 99 mg/dL  Glucose, capillary     Status: Abnormal   Collection Time: 11/09/14 10:07 PM  Result Value Ref Range   Glucose-Capillary 139 (H) 70 - 99 mg/dL   Comment 1 Documented in Chart    Comment 2 Notify RN   CBC     Status: Abnormal   Collection Time: 11/10/14  4:25 AM  Result Value Ref Range   WBC 21.8 (H) 4.0 - 10.5 K/uL   RBC 2.61 (L) 4.22 - 5.81 MIL/uL   Hemoglobin 7.0 (L) 13.0 - 17.0 g/dL   HCT 21.4 (L) 39.0 - 52.0 %   MCV 82.0 78.0 - 100.0 fL   MCH 26.4 26.0 - 34.0 pg   MCHC 32.2 30.0 - 36.0 g/dL   RDW 16.3 (H) 11.5 - 15.5 %   Platelets 542 (H) 150 - 400 K/uL  Basic  metabolic panel     Status: Abnormal   Collection Time: 11/10/14  4:25 AM  Result Value Ref Range   Sodium 136 135 - 145 mmol/L   Potassium 3.9 3.5 - 5.1 mmol/L   Chloride 112 96 - 112 mmol/L   CO2 22 19 - 32 mmol/L   Glucose, Bld 127 (H) 70 - 99 mg/dL   BUN 12 6 - 23 mg/dL   Creatinine, Ser 1.30 0.50 - 1.35 mg/dL   Calcium 10.1 8.4 - 10.5 mg/dL   GFR calc non Af Amer 72 (L) >90 mL/min   GFR calc Af Amer 84 (L) >90 mL/min    Comment: (NOTE) The eGFR has been calculated using the CKD EPI equation. This calculation has not been validated in all clinical situations. eGFR's persistently <90 mL/min signify possible Chronic Kidney Disease.    Anion gap 2 (L) 5 - 15    Comment: REPEATED TO VERIFY  Sedimentation rate     Status: Abnormal   Collection Time: 11/10/14  4:25 AM  Result Value Ref Range   Sed Rate 87 (H) 0 - 16 mm/hr  C-reactive protein     Status: Abnormal   Collection Time: 11/10/14  4:25 AM  Result Value Ref Range   CRP 22.1 (H) <0.60 mg/dL    Comment: Performed at [facility]  Glucose, capillary     Status: Abnormal   Collection Time: 11/10/14  7:55 AM  Result Value Ref Range   Glucose-Capillary 106 (H) 70 - 99 mg/dL  Glucose, capillary     Status: Abnormal   Collection Time: 11/10/14 12:19 PM  Result Value Ref Range   Glucose-Capillary 111 (H) 70 - 99 mg/dL  Glucose, capillary     Status: Abnormal   Collection Time: 11/10/14  5:21 PM  Result Value Ref Range   Glucose-Capillary 142 (H) 70 - 99 mg/dL  Glucose, capillary     Status: Abnormal   Collection Time: 11/10/14 10:09 PM  Result Value Ref Range   Glucose-Capillary 146 (H) 70 - 99 mg/dL   Comment 1 Documented in Chart    Comment 2 Notify RN   CBC     Status: Abnormal   Collection Time: 11/11/14  5:00 AM  Result Value Ref Range   WBC 19.5 (H) 4.0 - 10.5 K/uL   RBC 2.76 (L) 4.22 - 5.81 MIL/uL   Hemoglobin 7.1 (L) 13.0 - 17.0 g/dL   HCT 22.3 (L) 39.0 - 52.0 %   MCV 80.8 78.0 - 100.0 fL  MCH  25.7 (L) 26.0 - 34.0 pg   MCHC 31.8 30.0 - 36.0 g/dL   RDW 16.4 (H) 11.5 - 15.5 %   Platelets 590 (H) 150 - 400 K/uL  Basic metabolic panel     Status: Abnormal   Collection Time: 11/11/14  5:00 AM  Result Value Ref Range   Sodium 137 135 - 145 mmol/L   Potassium 4.1 3.5 - 5.1 mmol/L   Chloride 113 (H) 96 - 112 mmol/L   CO2 21 19 - 32 mmol/L   Glucose, Bld 127 (H) 70 - 99 mg/dL   BUN 12 6 - 23 mg/dL   Creatinine, Ser 1.06 0.50 - 1.35 mg/dL   Calcium 10.7 (H) 8.4 - 10.5 mg/dL   GFR calc non Af Amer >90 >90 mL/min   GFR calc Af Amer >90 >90 mL/min    Comment: (NOTE) The eGFR has been calculated using the CKD EPI equation. This calculation has not been validated in all clinical situations. eGFR's persistently <90 mL/min signify possible Chronic Kidney Disease.    Anion gap 3 (L) 5 - 15  Glucose, capillary     Status: Abnormal   Collection Time: 11/11/14  8:05 AM  Result Value Ref Range   Glucose-Capillary 118 (H) 70 - 99 mg/dL  Glucose, capillary     Status: Abnormal   Collection Time: 11/11/14 12:11 PM  Result Value Ref Range   Glucose-Capillary 202 (H) 70 - 99 mg/dL    Mr Pelvis W Wo Contrast  11/10/2014   CLINICAL DATA:  Quadriplegia. Sacral decubitus ulcer. Sepsis. Foul-smelling discharge from the decubitus ulcer.  EXAM: MRI PELVIS WITHOUT AND WITH CONTRAST  TECHNIQUE: Multiplanar multisequence MR imaging of the pelvis was performed both before and after administration of intravenous contrast.  CONTRAST:  25m MULTIHANCE GADOBENATE DIMEGLUMINE 529 MG/ML IV SOLN  COMPARISON:  10/02/2014  FINDINGS: Again noted are decubitus ulcers over the ischial tuberosities, right larger than left. The right decubitus ulcer extends down to the ischial tuberosity with underlying marrow edema and cortical irregularity consistent with osteomyelitis. There is mild diffuse edema of the pelvic musculature with mild enhancement. There are interval development of multiple small nonenhancing fluid  collections within the right adductor magnus and adductor brevis muscles extending from the right ischial tuberosity most consistent with intramuscular abscesses. The conglomeration of the area measures approximately 4.8 x 2 cm, but each individual collection measures significantly smaller than this. There is a rind of nonenhancement around the wound consistent with necrosis.  There is no marrow signal abnormality involving the left ischial tuberosity.  Bilateral hips demonstrate no fracture, dislocation or avascular necrosis. There is a small right hip joint effusion with mild synovial enhancement. There is no bursa formation. There is generalized edema within the subcutaneous fat throughout the pelvis.  IMPRESSION: 1. Again noted is a decubitus ulcer over the ischial tuberosities bilaterally, right larger than left. The right decubitus ulcer extends down to the ischial tuberosity with underlying marrow edema, cortical irregularity and enhancement consistent with osteomyelitis similar in appearance to the prior exam. There is mild diffuse edema throughout the pelvic musculature with mild enhancement with interval development of small nonenhancing fluid collections within the right adductor magnus and adductor brevis muscles extending from the right ischial tuberosity most consistent with intramuscular abscesses. 2. Small right joint effusion with mild synovial enhancement. Septic arthritis cannot be excluded. If there is clinical concern recommend arthrocentesis. 3. Decubitus ulcer overlying the left ischial tuberosity without underlying osseous abnormality. 4. There is  diffuse mild edema of the pelvic musculature which is likely neurogenic. There is a concern of an element of infectious myositis especially in the right adductor and gluteal compartment.   Electronically Signed   By: Kathreen Devoid   On: 11/10/2014 17:12    Review of Systems  Constitutional: Negative.   HENT: Negative.   Eyes: Negative.     Respiratory: Negative.   Cardiovascular: Negative.   Gastrointestinal: Negative.   Genitourinary: Negative.   Skin: Negative.   Psychiatric/Behavioral: Negative.    Blood pressure 96/65, pulse 81, temperature 98.5 F (36.9 C), temperature source Oral, resp. rate 16, height _0  (1.727 m), weight 70.4 kg (155 lb 3.3 oz), SpO2 100 %. Physical Exam  Constitutional: He is oriented to person, place, and time. He appears well-developed.  HENT:  Head: Normocephalic and atraumatic.  Eyes: Conjunctivae and EOM are normal. Pupils are equal, round, and reactive to light.  Respiratory: Effort normal.    GI: Soft.  Musculoskeletal:       Back:  Neurological: He is alert and oriented to person, place, and time.  Psychiatric: He has a normal mood and affect. His behavior is normal. Judgment and thought content normal.    Assessment/Plan: Recommend debridement with Acell and VAC placement.  Off loading is very important.  Continue with the air mattress bed, maximize protein intact and continue multivitamins daily, Vit C 500 mg twice daily and zinc 220 mg daily.  Will add to OR schedule.  [place] 11/11/2014, 2:52 PM

## 2014-11-11 NOTE — Progress Notes (Signed)
ANTIBIOTIC CONSULT NOTE - FOLLOW UP  Pharmacy Consult for meropenem Indication: decubitis ulcer, pelvic osteo with abscess  Allergies  Allergen Reactions  . Cefuroxime Axetil Anaphylaxis  . Morphine And Related Other (See Comments)    Changed mental status, confusion, headache, visual hallucination  . Penicillins Anaphylaxis and Other (See Comments)    ?can take amoxicillin?  . Sulfa Antibiotics Anaphylaxis, Shortness Of Breath and Other (See Comments)  . Tessalon [Benzonatate] Anaphylaxis  . Shellfish Allergy Itching and Other (See Comments)    Took benadryl to alleviate reaction    Patient Measurements: Height: 5\' 8"  (172.7 cm) Weight: 155 lb 3.3 oz (70.4 kg) (will check again.) IBW/kg (Calculated) : 68.4  Vital Signs: Temp: 98.4 F (36.9 C) (02/04 0621) Temp Source: Oral (02/04 0621) BP: 94/70 mmHg (02/04 0621) Pulse Rate: 78 (02/04 0621) Intake/Output from previous day: 02/03 0701 - 02/04 0700 In: 360 [P.O.:360] Out: 1950 [Urine:1950] Intake/Output from this shift: Total I/O In: 272 [P.O.:272] Out: 750 [Urine:550; Stool:200]  Labs:  Recent Labs  11/08/14 1513  11/09/14 0425 11/10/14 0425 11/11/14 0500  WBC  --   < > 24.1* 21.8* 19.5*  HGB  --   < > 7.3* 7.0* 7.1*  PLT  --   < > 526* 542* 590*  LABCREA 75.73  --   --   --   --   CREATININE  --   < > 1.36* 1.30 1.06  < > = values in this interval not displayed. Estimated Creatinine Clearance: 98.6 mL/min (by C-G formula based on Cr of 1.06). No results for input(s): VANCOTROUGH, VANCOPEAK, VANCORANDOM, GENTTROUGH, GENTPEAK, GENTRANDOM, TOBRATROUGH, TOBRAPEAK, TOBRARND, AMIKACINPEAK, AMIKACINTROU, AMIKACIN in the last 72 hours.   Microbiology: Recent Results (from the past 720 hour(s))  Urine culture     Status: None   Collection Time: 11/08/14  3:13 PM  Result Value Ref Range Status   Specimen Description URINE, CATHETERIZED  Final   Special Requests NONE  Final   Colony Count   Final    >=100,000  COLONIES/ML Performed at [facility]   Final    BURKHOLDERIA CEPACIA Performed at [facility]    Report Status 11/11/2014 FINAL  Final   Organism ID, Bacteria BURKHOLDERIA CEPACIA  Final      Susceptibility   Burkholderia cepacia - MIC*    LEVOFLOXACIN 4 INTERMEDIATE Intermediate     TRIMETH/SULFA 160 RESISTANT Resistant     CEFTAZIDIME 8 SENSITIVE Sensitive     * BURKHOLDERIA CEPACIA  Culture, blood (routine x 2)     Status: None (Preliminary result)   Collection Time: 11/08/14  5:25 PM  Result Value Ref Range Status   Specimen Description BLOOD ARM LEFT  Final   Special Requests BOTTLES DRAWN AEROBIC AND ANAEROBIC 10CC  Final   Culture   Final           BLOOD CULTURE RECEIVED NO GROWTH TO DATE CULTURE WILL BE HELD FOR 5 DAYS BEFORE ISSUING A FINAL NEGATIVE REPORT Note: Culture results may be compromised due to an excessive volume of blood received in culture bottles. Performed at [facility]    Report Status PENDING  Incomplete  Culture, blood (routine x 2)     Status: None (Preliminary result)   Collection Time: 11/08/14  5:35 PM  Result Value Ref Range Status   Specimen Description BLOOD ARM LEFT  Final   Special Requests BOTTLES DRAWN AEROBIC AND ANAEROBIC 10CC  Final   Culture  Final           BLOOD CULTURE RECEIVED NO GROWTH TO DATE CULTURE WILL BE HELD FOR 5 DAYS BEFORE ISSUING A FINAL NEGATIVE REPORT Performed at [facility]    Report Status PENDING  Incomplete    Anti-infectives    Start     Dose/Rate Route Frequency Ordered Stop   11/09/14 1400  linezolid (ZYVOX) tablet 600 mg     600 mg Oral Every 12 hours 11/09/14 1319     11/09/14 0700  vancomycin (VANCOCIN) 500 mg in sodium chloride 0.9 % 100 mL IVPB  Status:  Discontinued     500 mg100 mL/hr over 60 Minutes Intravenous Every 12 hours 11/08/14 1732 11/08/14 1737   11/08/14 2200  vancomycin (VANCOCIN) IVPB 750 mg/150 ml premix  Status:  Discontinued     750  mg150 mL/hr over 60 Minutes Intravenous Every 24 hours 11/08/14 1737 11/09/14 1319   11/08/14 1800  meropenem (MERREM) 1 g in sodium chloride 0.9 % 100 mL IVPB     1 g200 mL/hr over 30 Minutes Intravenous Every 8 hours 11/08/14 1732     11/08/14 1745  vancomycin (VANCOCIN) 1,250 mg in sodium chloride 0.9 % 250 mL IVPB  Status:  Discontinued     1,250 mg166.7 mL/hr over 90 Minutes Intravenous  Once 11/08/14 1732 11/08/14 1734      Assessment: 54 YOM with paraplegia, presents with a decubitus ulcer that has required extensive debridement. He was being followed by Dr. Tommy Medal prior to admission and was receiving IV vancomycin. 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 He also continues on meropenem. He is afebrile, WBC are elevated, and AKI has improved.  Zyvox 2/2>> Meropenem 2/1>> Vanc PTA >>2/1  Goal of Therapy:  Eradication of infection  Plan:  - Continue meropenem 1 g IV q8h - Monitor renal function and clinical progress  [facility], Pharm.D., BCPS Clinical Pharmacist Pager: [phone] 11/11/2014 1:58 PM

## 2014-11-11 NOTE — Consult Note (Addendum)
Reason for Consult: Right hip and pelvic infection Referring Physician: Dr. Jeremy Johann is an 29 y.o. male.  HPI: Read is a 29 year old patient admitted for sepsis. 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 He is tentatively scheduled tomorrow for debridement of sacral and ischeal l decubitus ulcers. MRI scan has been performed which shows osteomyelitis of the sacrum and ischeal tuberosity along with some contiguous areas of fluid enhancing lesions within the muscle. Also present on the MRI scan is a fluid collection in the right hip which is mild. There is no such fluid collection the left hip. He has been treated with antibiotics but has become septic while on IV antibiotics.  Past Medical History  Diagnosis Date  . GERD (gastroesophageal reflux disease)   . Asthma   . Hx MRSA infection     on face  . Gastroparesis   . Diabetic neuropathy   . Seizures   . Stroke     spinal stroke in 4/15  . Diabetes mellitus     sees Dr. Loanne Drilling   . Family history of anesthesia complication     Pt mother can't have epidural procedures  . Dysrhythmia   . Pneumonia   . Arthritis   . Fibromyalgia     Past Surgical History  Procedure Laterality Date  . Tonsillectomy    . Multiple extractions with alveoloplasty N/A 08/03/2014    Procedure: MULTIPLE EXTRACTIONS;  Surgeon: Gae Bon, DDS;  Location: [facility];  Service: Oral Surgery;  Laterality: N/A;  . Tee without cardioversion N/A 08/17/2014    Procedure: TRANSESOPHAGEAL ECHOCARDIOGRAM (TEE);  Surgeon: Dorothy Spark, MD;  Location: [facility] ENDOSCOPY;  Service: Cardiovascular;  Laterality: N/A;  . Debridment of decubitus ulcer N/A 10/04/2014    Procedure: DEBRIDMENT OF DECUBITUS ULCER;  Surgeon: Georganna Skeans, MD;  Location: [place];  Service: General;  Laterality: N/A;  . Laparoscopic diverted colostomy N/A 10/12/2014    Procedure: LAPAROSCOPIC DIVERTING COLOSTOMY;  Surgeon: Donnie Mesa, MD;  Location:  [place];  Service: General;  Laterality: N/A;  . Insertion of suprapubic catheter N/A 10/12/2014    Procedure: INSERTION OF SUPRAPUBIC CATHETER;  Surgeon: Reece Packer, MD;  Location: [place];  Service: Urology;  Laterality: N/A;    Family History  Problem Relation Age of [place]  . Diabetes Father   . Hypertension Father   . Asthma      fhx  . Hypertension      fhx  . Stroke      fhx  . Heart disease Mother     Social History:  reports that he has been smoking Cigars and Cigarettes.  He has never used smokeless tobacco. He reports that he does not drink alcohol or use illicit drugs.  Allergies:  Allergies  Allergen Reactions  . Cefuroxime Axetil Anaphylaxis  . Morphine And Related Other (See Comments)    Changed mental status, confusion, headache, visual hallucination  . Penicillins Anaphylaxis and Other (See Comments)    ?can take amoxicillin?  . Sulfa Antibiotics Anaphylaxis, Shortness Of Breath and Other (See Comments)  . Tessalon [Benzonatate] Anaphylaxis  . Shellfish Allergy Itching and Other (See Comments)    Took benadryl to alleviate reaction    Medications: I have reviewed the patient's current medications.  Results for orders placed or performed during the hospital encounter of 11/08/14 (from the past 48 hour(s))  Glucose, capillary     Status: Abnormal   Collection Time:  11/09/14 10:07 PM  Result Value Ref Range   Glucose-Capillary 139 (H) 70 - 99 mg/dL   Comment 1 Documented in Chart    Comment 2 Notify RN   CBC     Status: Abnormal   Collection Time: 11/10/14  4:25 AM  Result Value Ref Range   WBC 21.8 (H) 4.0 - 10.5 K/uL   RBC 2.61 (L) 4.22 - 5.81 MIL/uL   Hemoglobin 7.0 (L) 13.0 - 17.0 g/dL   HCT 21.4 (L) 39.0 - 52.0 %   MCV 82.0 78.0 - 100.0 fL   MCH 26.4 26.0 - 34.0 pg   MCHC 32.2 30.0 - 36.0 g/dL   RDW 16.3 (H) 11.5 - 15.5 %   Platelets 542 (H) 150 - 400 K/uL  Basic metabolic panel     Status: Abnormal   Collection Time: 11/10/14  4:25 AM   Result Value Ref Range   Sodium 136 135 - 145 mmol/L   Potassium 3.9 3.5 - 5.1 mmol/L   Chloride 112 96 - 112 mmol/L   CO2 22 19 - 32 mmol/L   Glucose, Bld 127 (H) 70 - 99 mg/dL   BUN 12 6 - 23 mg/dL   Creatinine, Ser 1.30 0.50 - 1.35 mg/dL   Calcium 10.1 8.4 - 10.5 mg/dL   GFR calc non Af Amer 72 (L) >90 mL/min   GFR calc Af Amer 84 (L) >90 mL/min    Comment: (NOTE) The eGFR has been calculated using the CKD EPI equation. This calculation has not been validated in all clinical situations. eGFR's persistently <90 mL/min signify possible Chronic Kidney Disease.    Anion gap 2 (L) 5 - 15    Comment: REPEATED TO VERIFY  Sedimentation rate     Status: Abnormal   Collection Time: 11/10/14  4:25 AM  Result Value Ref Range   Sed Rate 87 (H) 0 - 16 mm/hr  C-reactive protein     Status: Abnormal   Collection Time: 11/10/14  4:25 AM  Result Value Ref Range   CRP 22.1 (H) <0.60 mg/dL    Comment: Performed at [facility]  Glucose, capillary     Status: Abnormal   Collection Time: 11/10/14  7:55 AM  Result Value Ref Range   Glucose-Capillary 106 (H) 70 - 99 mg/dL  Glucose, capillary     Status: Abnormal   Collection Time: 11/10/14 12:19 PM  Result Value Ref Range   Glucose-Capillary 111 (H) 70 - 99 mg/dL  Glucose, capillary     Status: Abnormal   Collection Time: 11/10/14  5:21 PM  Result Value Ref Range   Glucose-Capillary 142 (H) 70 - 99 mg/dL  Glucose, capillary     Status: Abnormal   Collection Time: 11/10/14 10:09 PM  Result Value Ref Range   Glucose-Capillary 146 (H) 70 - 99 mg/dL   Comment 1 Documented in Chart    Comment 2 Notify RN   CBC     Status: Abnormal   Collection Time: 11/11/14  5:00 AM  Result Value Ref Range   WBC 19.5 (H) 4.0 - 10.5 K/uL   RBC 2.76 (L) 4.22 - 5.81 MIL/uL   Hemoglobin 7.1 (L) 13.0 - 17.0 g/dL   HCT 22.3 (L) 39.0 - 52.0 %   MCV 80.8 78.0 - 100.0 fL   MCH 25.7 (L) 26.0 - 34.0 pg   MCHC 31.8 30.0 - 36.0 g/dL   RDW 16.4 (H) 11.5 -  15.5 %   Platelets 590 (H) 150 -  400 K/uL  Basic metabolic panel     Status: Abnormal   Collection Time: 11/11/14  5:00 AM  Result Value Ref Range   Sodium 137 135 - 145 mmol/L   Potassium 4.1 3.5 - 5.1 mmol/L   Chloride 113 (H) 96 - 112 mmol/L   CO2 21 19 - 32 mmol/L   Glucose, Bld 127 (H) 70 - 99 mg/dL   BUN 12 6 - 23 mg/dL   Creatinine, Ser 1.06 0.50 - 1.35 mg/dL   Calcium 10.7 (H) 8.4 - 10.5 mg/dL   GFR calc non Af Amer >90 >90 mL/min   GFR calc Af Amer >90 >90 mL/min    Comment: (NOTE) The eGFR has been calculated using the CKD EPI equation. This calculation has not been validated in all clinical situations. eGFR's persistently <90 mL/min signify possible Chronic Kidney Disease.    Anion gap 3 (L) 5 - 15  Glucose, capillary     Status: Abnormal   Collection Time: 11/11/14  8:05 AM  Result Value Ref Range   Glucose-Capillary 118 (H) 70 - 99 mg/dL  Glucose, capillary     Status: Abnormal   Collection Time: 11/11/14 12:11 PM  Result Value Ref Range   Glucose-Capillary 202 (H) 70 - 99 mg/dL  Glucose, capillary     Status: Abnormal   Collection Time: 11/11/14  5:13 PM  Result Value Ref Range   Glucose-Capillary 154 (H) 70 - 99 mg/dL    Mr Pelvis W Wo Contrast  11/10/2014   CLINICAL DATA:  Quadriplegia. Sacral decubitus ulcer. Sepsis. Foul-smelling discharge from the decubitus ulcer.  EXAM: MRI PELVIS WITHOUT AND WITH CONTRAST  TECHNIQUE: Multiplanar multisequence MR imaging of the pelvis was performed both before and after administration of intravenous contrast.  CONTRAST:  83m MULTIHANCE GADOBENATE DIMEGLUMINE 529 MG/ML IV SOLN  COMPARISON:  10/02/2014  FINDINGS: Again noted are decubitus ulcers over the ischial tuberosities, right larger than left. The right decubitus ulcer extends down to the ischial tuberosity with underlying marrow edema and cortical irregularity consistent with osteomyelitis. There is mild diffuse edema of the pelvic musculature with mild enhancement. There  are interval development of multiple small nonenhancing fluid collections within the right adductor magnus and adductor brevis muscles extending from the right ischial tuberosity most consistent with intramuscular abscesses. The conglomeration of the area measures approximately 4.8 x 2 cm, but each individual collection measures significantly smaller than this. There is a rind of nonenhancement around the wound consistent with necrosis.  There is no marrow signal abnormality involving the left ischial tuberosity.  Bilateral hips demonstrate no fracture, dislocation or avascular necrosis. There is a small right hip joint effusion with mild synovial enhancement. There is no bursa formation. There is generalized edema within the subcutaneous fat throughout the pelvis.  IMPRESSION: 1. Again noted is a decubitus ulcer over the ischial tuberosities bilaterally, right larger than left. The right decubitus ulcer extends down to the ischial tuberosity with underlying marrow edema, cortical irregularity and enhancement consistent with osteomyelitis similar in appearance to the prior exam. There is mild diffuse edema throughout the pelvic musculature with mild enhancement with interval development of small nonenhancing fluid collections within the right adductor magnus and adductor brevis muscles extending from the right ischial tuberosity most consistent with intramuscular abscesses. 2. Small right joint effusion with mild synovial enhancement. Septic arthritis cannot be excluded. If there is clinical concern recommend arthrocentesis. 3. Decubitus ulcer overlying the left ischial tuberosity without underlying osseous abnormality. 4. There is diffuse  mild edema of the pelvic musculature which is likely neurogenic. There is a concern of an element of infectious myositis especially in the right adductor and gluteal compartment.   Electronically Signed   By: Kathreen Devoid   On: 11/10/2014 17:12    Review of Systems   Constitutional: Positive for fever and chills.  HENT: Negative.   Eyes: Negative.   Respiratory: Negative.   Cardiovascular: Negative.   Gastrointestinal: Negative.   Skin: Negative.   Neurological: Negative.   Psychiatric/Behavioral: Negative.    Blood pressure 96/65, pulse 81, temperature 98.5 F (36.9 C), temperature source Oral, resp. rate 16, height 5' 8"  (1.727 m), weight 70.4 kg (155 lb 3.3 oz), SpO2 100 %. Physical Exam  Constitutional: He appears well-developed.  HENT:  Head: Normocephalic.  Eyes: Pupils are equal, round, and reactive to light.  Neck: Normal range of motion.  Cardiovascular: Normal rate.   Respiratory: Effort normal.   patient has 2 sacral decubitus ulcers posteriorly. These were not examined. Exam is right thigh area and I could not palpate any areas of fluctuance or crepitus. He has diminished sensation below the waist and no real asymmetry of pain response with logrolling of the right or left hip.  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 There are some contiguous areas of potential small muscle abscesses contiguous with the initial tuberosity infection. Patient also has a new right hip fluid collection which may be his only true closed space infection. Plan at this time in talking with Hershel's mother is for plastic surgery to perform a debridement on the sacrum tomorrow with application of wound VAC. In regards to the hip I think an aspiration and examination of the fluid is indicated. Because of his antibiotics it is unlikely anything will grow out of culture. Nonetheless think with an increased white count in the fluid and anterior hip approach would potentially be indicated for drainage. We will try to arrange the hip aspiration under fluoroscopy within the next 1-2 days.  Dashawn Golda SCOTT 11/11/2014, 9:06 PM

## 2014-11-12 ENCOUNTER — Encounter (HOSPITAL_COMMUNITY)
Admission: EM | Disposition: A | Discharge status: Home / Self Care | Payer: Self-pay | Source: Home / Self Care | Attending: Internal Medicine

## 2014-11-12 ENCOUNTER — Inpatient Hospital Stay (HOSPITAL_COMMUNITY): Payer: Medicaid Other | Admitting: Certified Registered Nurse Anesthetist

## 2014-11-12 ENCOUNTER — Encounter (HOSPITAL_COMMUNITY): Payer: Self-pay | Admitting: Plastic Surgery

## 2014-11-12 DIAGNOSIS — L89159 Pressure ulcer of sacral region, unspecified stage: Secondary | ICD-10-CM | POA: Insufficient documentation

## 2014-11-12 DIAGNOSIS — R7881 Bacteremia: Secondary | ICD-10-CM | POA: Insufficient documentation

## 2014-11-12 HISTORY — PX: APPLICATION OF A-CELL OF BACK: SHX6301

## 2014-11-12 HISTORY — PX: INCISION AND DRAINAGE OF WOUND: SHX1803

## 2014-11-12 LAB — POCT I-STAT 4, (NA,K, GLUC, HGB,HCT)
Glucose, Bld: 56 mg/dL — ABNORMAL LOW (ref 70–99)
HCT: 23 % — ABNORMAL LOW (ref 39.0–52.0)
HEMOGLOBIN: 7.8 g/dL — AB (ref 13.0–17.0)
POTASSIUM: 4.8 mmol/L (ref 3.5–5.1)
SODIUM: 141 mmol/L (ref 135–145)

## 2014-11-12 LAB — BASIC METABOLIC PANEL
ANION GAP: 4 — AB (ref 5–15)
BUN: 15 mg/dL (ref 6–23)
CO2: 23 mmol/L (ref 19–32)
Calcium: 10.3 mg/dL (ref 8.4–10.5)
Chloride: 111 mmol/L (ref 96–112)
Creatinine, Ser: 1.15 mg/dL (ref 0.50–1.35)
GFR calc Af Amer: >90 mL/min (ref >90)
GFR, EST NON AFRICAN AMERICAN: 84 mL/min — AB (ref >90)
Glucose, Bld: 160 mg/dL — ABNORMAL HIGH (ref 70–99)
Potassium: 4.6 mmol/L (ref 3.5–5.1)
Sodium: 138 mmol/L (ref 135–145)

## 2014-11-12 LAB — GLUCOSE, CAPILLARY
GLUCOSE-CAPILLARY: 91 mg/dL (ref 70–99)
GLUCOSE-CAPILLARY: 111 mg/dL — AB (ref 70–99)
GLUCOSE-CAPILLARY: 97 mg/dL (ref 70–99)
Glucose-Capillary: 35 mg/dL — CL (ref 70–99)
Glucose-Capillary: 130 mg/dL — ABNORMAL HIGH (ref 70–99)
Glucose-Capillary: 31 mg/dL — CL (ref 70–99)
Glucose-Capillary: 78 mg/dL (ref 70–99)

## 2014-11-12 LAB — CBC
HEMATOCRIT: 22.1 % — AB (ref 39.0–52.0)
HEMOGLOBIN: 7 g/dL — AB (ref 13.0–17.0)
MCH: 25.7 pg — ABNORMAL LOW (ref 26.0–34.0)
MCHC: 31.7 g/dL (ref 30.0–36.0)
MCV: 81.3 fL (ref 78.0–100.0)
Platelets: 565 10*3/uL — ABNORMAL HIGH (ref 150–400)
RBC: 2.72 MIL/uL — ABNORMAL LOW (ref 4.22–5.81)
RDW: 16.4 % — ABNORMAL HIGH (ref 11.5–15.5)
WBC: 17.5 10*3/uL — ABNORMAL HIGH (ref 4.0–10.5)

## 2014-11-12 LAB — PREALBUMIN: PREALBUMIN: 5.9 mg/dL — AB (ref 17.0–34.0)

## 2014-11-12 LAB — CLOSTRIDIUM DIFFICILE BY PCR: Toxigenic C. Difficile by PCR: NEGATIVE

## 2014-11-12 SURGERY — IRRIGATION AND DEBRIDEMENT WOUND
Anesthesia: General | Site: Buttocks

## 2014-11-12 MED ORDER — DEXTROSE 50 % IV SOLN
INTRAVENOUS | Status: AC
  Administered 2014-11-12: 50 mL
  Filled 2014-11-12: qty 50

## 2014-11-12 MED ORDER — LACTATED RINGERS IV BOLUS (SEPSIS)
500.0000 mL | Freq: Once | INTRAVENOUS | Status: AC
  Administered 2014-11-12: 500 mL via INTRAVENOUS

## 2014-11-12 MED ORDER — LACTATED RINGERS IV SOLN
INTRAVENOUS | Status: DC | PRN
  Administered 2014-11-12: 12:00:00 via INTRAVENOUS

## 2014-11-12 MED ORDER — MIDAZOLAM HCL 2 MG/2ML IJ SOLN
INTRAMUSCULAR | Status: AC
  Filled 2014-11-12: qty 2

## 2014-11-12 MED ORDER — DEXTROSE 50 % IV SOLN
INTRAVENOUS | Status: DC | PRN
  Administered 2014-11-12: 50 mL via INTRAVENOUS

## 2014-11-12 MED ORDER — INSULIN GLARGINE 100 UNIT/ML ~~LOC~~ SOLN
20.0000 [IU] | Freq: Every day | SUBCUTANEOUS | Status: DC
  Filled 2014-11-12 (×2): qty 0.2

## 2014-11-12 MED ORDER — DOUBLE ANTIBIOTIC 500-10000 UNIT/GM EX OINT
TOPICAL_OINTMENT | CUTANEOUS | Status: AC
  Filled 2014-11-12: qty 1

## 2014-11-12 MED ORDER — ONDANSETRON HCL 4 MG/2ML IJ SOLN
INTRAMUSCULAR | Status: AC
  Filled 2014-11-12: qty 2

## 2014-11-12 MED ORDER — BACITRACIN ZINC 500 UNIT/GM EX OINT
TOPICAL_OINTMENT | CUTANEOUS | Status: DC | PRN
  Administered 2014-11-12: 1 via TOPICAL

## 2014-11-12 MED ORDER — ROCURONIUM BROMIDE 100 MG/10ML IV SOLN
INTRAVENOUS | Status: DC | PRN
  Administered 2014-11-12: 10 mg via INTRAVENOUS

## 2014-11-12 MED ORDER — LIDOCAINE HCL 4 % MT SOLN
OROMUCOSAL | Status: DC | PRN
  Administered 2014-11-12: 3 mL via TOPICAL

## 2014-11-12 MED ORDER — INSULIN ASPART 100 UNIT/ML ~~LOC~~ SOLN
4.0000 [IU] | Freq: Three times a day (TID) | SUBCUTANEOUS | Status: DC
  Administered 2014-11-13 – 2014-11-17 (×12): 4 [IU] via SUBCUTANEOUS

## 2014-11-12 MED ORDER — OXYCODONE HCL 5 MG PO TABS: 15.0000 mg | ORAL_TABLET | Freq: Four times a day (QID) | ORAL | Status: DC

## 2014-11-12 MED ORDER — FENTANYL CITRATE 0.05 MG/ML IJ SOLN
INTRAMUSCULAR | Status: AC
  Filled 2014-11-12: qty 5

## 2014-11-12 MED ORDER — GLUCAGON HCL RDNA (DIAGNOSTIC) 1 MG IJ SOLR
INTRAMUSCULAR | Status: AC
  Filled 2014-11-12: qty 1

## 2014-11-12 MED ORDER — SODIUM CHLORIDE 0.9 % IV BOLUS (SEPSIS)
500.0000 mL | Freq: Once | INTRAVENOUS | Status: AC
  Administered 2014-11-12: 500 mL via INTRAVENOUS

## 2014-11-12 MED ORDER — DEXTROSE 50 % IV SOLN
INTRAVENOUS | Status: AC
Start: 2014-11-12 — End: 2014-11-12
  Administered 2014-11-12: 25 mL
  Filled 2014-11-12: qty 50

## 2014-11-12 MED ORDER — SODIUM CHLORIDE 0.9 % IR SOLN
Status: DC | PRN
  Administered 2014-11-12: 1000 mL

## 2014-11-12 MED ORDER — SODIUM CHLORIDE 0.9 % IR SOLN
Status: DC | PRN
  Administered 2014-11-12: 500 mL

## 2014-11-12 MED ORDER — NEOSTIGMINE METHYLSULFATE 10 MG/10ML IV SOLN
INTRAVENOUS | Status: DC | PRN
Start: 2014-11-12 — End: 2014-11-12
  Administered 2014-11-12: 3 mg via INTRAVENOUS

## 2014-11-12 MED ORDER — LIDOCAINE HCL (CARDIAC) 20 MG/ML IV SOLN
INTRAVENOUS | Status: AC
  Filled 2014-11-12: qty 5

## 2014-11-12 MED ORDER — PROPOFOL 10 MG/ML IV BOLUS
INTRAVENOUS | Status: AC
  Filled 2014-11-12: qty 20

## 2014-11-12 MED ORDER — GLYCOPYRROLATE 0.2 MG/ML IJ SOLN
INTRAMUSCULAR | Status: DC | PRN
  Administered 2014-11-12: 0.4 mg via INTRAVENOUS

## 2014-11-12 MED ORDER — ONDANSETRON HCL 4 MG/2ML IJ SOLN
INTRAMUSCULAR | Status: DC | PRN
  Administered 2014-11-12: 4 mg via INTRAVENOUS

## 2014-11-12 MED ORDER — LIDOCAINE HCL (CARDIAC) 20 MG/ML IV SOLN
INTRAVENOUS | Status: DC | PRN
  Administered 2014-11-12: 80 mg via INTRAVENOUS

## 2014-11-12 MED ORDER — PROPOFOL 10 MG/ML IV BOLUS
INTRAVENOUS | Status: DC | PRN
  Administered 2014-11-12: 90 mg via INTRAVENOUS

## 2014-11-12 SURGICAL SUPPLY — 49 items
APPLICATOR COTTON TIP 6IN STRL (MISCELLANEOUS) ×3 IMPLANT
BAG DECANTER FOR FLEXI CONT (MISCELLANEOUS) IMPLANT
BENZOIN TINCTURE PRP APPL 2/3 (GAUZE/BANDAGES/DRESSINGS) ×9 IMPLANT
BLADE 10 SAFETY STRL DISP (BLADE) ×3 IMPLANT
BLADE SURG ROTATE 9660 (MISCELLANEOUS) IMPLANT
BNDG GAUZE ELAST 4 BULKY (GAUZE/BANDAGES/DRESSINGS) IMPLANT
CANISTER SUCTION 2500CC (MISCELLANEOUS) ×3 IMPLANT
CANISTER WOUND CARE 500ML ATS (WOUND CARE) ×3 IMPLANT
CHLORAPREP W/TINT 26ML (MISCELLANEOUS) IMPLANT
CONT SPEC 4OZ CLIKSEAL STRL BL (MISCELLANEOUS) ×3 IMPLANT
CONT SPEC STER OR (MISCELLANEOUS) IMPLANT
COVER SURGICAL LIGHT HANDLE (MISCELLANEOUS) ×3 IMPLANT
DRAPE IMP U-DRAPE 54X76 (DRAPES) ×3 IMPLANT
DRAPE INCISE IOBAN 66X45 STRL (DRAPES) IMPLANT
DRAPE INCISE IOBAN 85X60 (DRAPES) ×3 IMPLANT
DRAPE PED LAPAROTOMY (DRAPES) ×3 IMPLANT
DRAPE PROXIMA HALF (DRAPES) IMPLANT
DRSG ADAPTIC 3X8 NADH LF (GAUZE/BANDAGES/DRESSINGS) ×3 IMPLANT
DRSG PAD ABDOMINAL 8X10 ST (GAUZE/BANDAGES/DRESSINGS) ×3 IMPLANT
DRSG VAC ATS LRG SENSATRAC (GAUZE/BANDAGES/DRESSINGS) IMPLANT
DRSG VAC ATS MED SENSATRAC (GAUZE/BANDAGES/DRESSINGS) ×3 IMPLANT
DRSG VAC ATS SM SENSATRAC (GAUZE/BANDAGES/DRESSINGS) IMPLANT
ELECT CAUTERY BLADE 6.4 (BLADE) ×3 IMPLANT
ELECT REM PT RETURN 9FT ADLT (ELECTROSURGICAL) ×3
ELECTRODE REM PT RTRN 9FT ADLT (ELECTROSURGICAL) ×1 IMPLANT
GAUZE SPONGE 4X4 12PLY STRL (GAUZE/BANDAGES/DRESSINGS) ×3 IMPLANT
GAUZE XEROFORM 5X9 LF (GAUZE/BANDAGES/DRESSINGS) ×6 IMPLANT
GLOVE BIO SURGEON STRL SZ 6.5 (GLOVE) ×2 IMPLANT
GLOVE BIO SURGEONS STRL SZ 6.5 (GLOVE) ×1
GOWN STRL REUS W/ TWL LRG LVL3 (GOWN DISPOSABLE) ×2 IMPLANT
GOWN STRL REUS W/TWL LRG LVL3 (GOWN DISPOSABLE) ×4
H R LUBE JELLY XXX (MISCELLANEOUS) ×3 IMPLANT
KIT BASIN OR (CUSTOM PROCEDURE TRAY) ×3 IMPLANT
KIT ROOM TURNOVER OR (KITS) ×3 IMPLANT
MATRIX SURGICAL PSM 7X10CM (Tissue) ×3 IMPLANT
MICROMATRIX 500MG (Tissue) ×9 IMPLANT
NS IRRIG 1000ML POUR BTL (IV SOLUTION) ×3 IMPLANT
PACK GENERAL/GYN (CUSTOM PROCEDURE TRAY) ×3 IMPLANT
PACK UNIVERSAL I (CUSTOM PROCEDURE TRAY) ×3 IMPLANT
PAD ARMBOARD 7.5X6 YLW CONV (MISCELLANEOUS) ×6 IMPLANT
SOLUTION PARTIC MCRMTRX 500MG (Tissue) ×3 IMPLANT
SURGILUBE 2OZ TUBE FLIPTOP (MISCELLANEOUS) IMPLANT
SUT VIC AB 5-0 PS2 18 (SUTURE) ×6 IMPLANT
SWAB COLLECTION DEVICE MRSA (MISCELLANEOUS) IMPLANT
TAPE CLOTH SURG 4X10 WHT LF (GAUZE/BANDAGES/DRESSINGS) ×3 IMPLANT
TOWEL OR 17X24 6PK STRL BLUE (TOWEL DISPOSABLE) ×3 IMPLANT
TOWEL OR 17X26 10 PK STRL BLUE (TOWEL DISPOSABLE) ×3 IMPLANT
TUBE ANAEROBIC SPECIMEN COL (MISCELLANEOUS) IMPLANT
UNDERPAD 30X30 INCONTINENT (UNDERPADS AND DIAPERS) ×3 IMPLANT

## 2014-11-12 NOTE — Op Note (Addendum)
Operative Note   DATE OF OPERATION: 11/12/2014  LOCATION: [name]  SURGICAL DIVISION: Plastic Surgery  PREOPERATIVE DIAGNOSES:  Sacral / ischial ulcer x 2 (5 x 5 x 1.5 cm sacral and 4 x 6 x 3 cm ischial)  POSTOPERATIVE DIAGNOSES:  same  PROCEDURE:  Preparation of sacral ischial ulcers with placement of Acell (1.5 gm and sheets 7 x 10 cm) and VAC placement. Biopsy of coccyx bone and debridement of skin, muscle and soft tissue.  SURGEON: Theodoro Kos, DO  ANESTHESIA:  General.   COMPLICATIONS: None.   INDICATIONS FOR PROCEDURE:  The patient, Jason [name] is a 29 y.o. male born on [birth year], is here for treatment of sacral ischial ulcers. MRN: [id number]  CONSENT:  Informed consent was obtained directly from the patient. Risks, benefits and alternatives were fully discussed. Specific risks including but not limited to bleeding, infection, hematoma, seroma, scarring, pain, infection, contracture, asymmetry, wound healing problems, and need for further surgery were all discussed. The patient did have an ample opportunity to have questions answered to satisfaction.   DESCRIPTION OF PROCEDURE:  The patient was taken to the operating room. The patient's operative site was prepped and draped in a sterile fashion. A time out was performed and all information was confirmed to be correct.  General anesthesia was administered.  The #10 blade and scissors were used to debride the area of the sacral and ischial ulcers (5 x 5 x 1.5 cm sacral and 4 x 6 x 3 cm ischial).  The ronguer was used to debride the coccyx bone and send for microbiology. The area was irrigated with antibiotic solution.  The bovie was used for hemostasis.  The Acell powder 1.5 gm and sheet 7 x 10 cm were applied and secured with 5-0 Vicryl.  An adaptic and VAC were placed and there was an excellent seal.  The patient tolerated the procedure well.  There were no complications. The patient was allowed to wake from  anesthesia, extubated and taken to the recovery room in satisfactory condition.

## 2014-11-12 NOTE — Consult Note (Signed)
Reason for Consult: Bacteruria, Neurogenic Bladder  Referring Physician: Orson Eva MD  Jason [name] is an 29 y.o. male.   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 SPT placed about 6 weeks ago by Dr. Matilde Sprang, has not yet been changed. Cr <1.5 this admission. Renal imaging withen past 56mo w/o stones or hydro.   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 New bone CX / wound CX from sacral wound pending. No evidence of GU fistula by MRI this admission.   Today TKirbyis seen in consultation for above, specifically for change of SP tube. He reports no problems whatsoever with current SP tube but ID team may be nidus for recurrent infectino and he is due for change about now anyway. His mother who is very active in his care ultimately wants to learn to do changes herself at home.   Past Medical History  Diagnosis Date  . GERD (gastroesophageal reflux disease)   . Asthma   . Hx MRSA infection     on face  . Gastroparesis   . Diabetic neuropathy   . Seizures   . Stroke     spinal stroke in 4/15  . Diabetes mellitus     sees Dr. ELoanne Drilling  . Family history of anesthesia complication     Pt mother can't have epidural procedures  . Dysrhythmia   . Pneumonia   . Arthritis   . Fibromyalgia     Past Surgical History  Procedure Laterality Date  . Tonsillectomy    . Multiple extractions with alveoloplasty N/A 08/03/2014    Procedure: MULTIPLE EXTRACTIONS;  Surgeon: SGae Bon DDS;  [place] [facility]  Service: Oral Surgery;  Laterality: N/A;  . Tee without cardioversion N/A 08/17/2014    Procedure: TRANSESOPHAGEAL ECHOCARDIOGRAM (TEE);  Surgeon: KDorothy Spark MD;  [facility]  Service: Cardiovascular;  Laterality: N/A;  . Debridment of decubitus ulcer N/A 10/04/2014    Procedure: DEBRIDMENT OF DECUBITUS ULCER;  Surgeon: BGeorganna Skeans MD;  [place] [facility]  Service: General;  Laterality: N/A;  . Laparoscopic diverted colostomy N/A 10/12/2014    Procedure: LAPAROSCOPIC DIVERTING COLOSTOMY;  Surgeon: MDonnie Mesa MD;  [place] [facility]  Service: General;  Laterality: N/A;  . Insertion of suprapubic catheter N/A 10/12/2014    Procedure: INSERTION OF SUPRAPUBIC CATHETER;  Surgeon: SReece Packer MD;  [facility] [facility]  Service: Urology;  Laterality: N/A;    Family History  Problem Relation Age of Onset  . Diabetes Father   . Hypertension Father   . Asthma      fhx  . Hypertension      fhx  . Stroke      fhx  . Heart disease Mother     Social History:  reports that he has been smoking Cigars and Cigarettes.  He has never used smokeless tobacco. He reports that he does not drink alcohol or use illicit drugs.  Allergies:  Allergies  Allergen Reactions  . Cefuroxime Axetil Anaphylaxis  . Morphine And Related Other (See Comments)    Changed mental status, confusion, headache, visual hallucination  . Penicillins Anaphylaxis and Other (See Comments)    ?can take amoxicillin?  . Sulfa Antibiotics Anaphylaxis, Shortness Of Breath and Other (See Comments)  . Tessalon [Benzonatate] Anaphylaxis  . Shellfish Allergy Itching and Other (See Comments)  Took benadryl to alleviate reaction    Medications: I have reviewed the patient's current medications.  Results for orders placed or performed during the hospital encounter of 11/08/14 (from the past 48 hour(s))  Glucose, capillary     Status: Abnormal   Collection Time: 11/10/14 10:09 PM  Result Value Ref Range   Glucose-Capillary 146 (H) 70 - 99 mg/dL   Comment 1 Documented in Chart    Comment 2 Notify RN   CBC     Status: Abnormal   Collection Time: 11/11/14  5:00 AM  Result Value Ref Range   WBC 19.5 (H) 4.0 - 10.5 K/uL   RBC 2.76 (L) 4.22 - 5.81 MIL/uL   Hemoglobin 7.1 (L) 13.0 - 17.0 g/dL   HCT 22.3 (L) 39.0 - 52.0 %   MCV 80.8 78.0 - 100.0 fL    MCH 25.7 (L) 26.0 - 34.0 pg   MCHC 31.8 30.0 - 36.0 g/dL   RDW 16.4 (H) 11.5 - 15.5 %   Platelets 590 (H) 150 - 400 K/uL  Basic metabolic panel     Status: Abnormal   Collection Time: 11/11/14  5:00 AM  Result Value Ref Range   Sodium 137 135 - 145 mmol/L   Potassium 4.1 3.5 - 5.1 mmol/L   Chloride 113 (H) 96 - 112 mmol/L   CO2 21 19 - 32 mmol/L   Glucose, Bld 127 (H) 70 - 99 mg/dL   BUN 12 6 - 23 mg/dL   Creatinine, Ser 1.06 0.50 - 1.35 mg/dL   Calcium 10.7 (H) 8.4 - 10.5 mg/dL   GFR calc non Af Amer >90 >90 mL/min   GFR calc Af Amer >90 >90 mL/min    Comment: (NOTE) The eGFR has been calculated using the CKD EPI equation. This calculation has not been validated in all clinical situations. eGFR's persistently <90 mL/min signify possible Chronic Kidney Disease.    Anion gap 3 (L) 5 - 15  Glucose, capillary     Status: Abnormal   Collection Time: 11/11/14  8:05 AM  Result Value Ref Range   Glucose-Capillary 118 (H) 70 - 99 mg/dL  Glucose, capillary     Status: Abnormal   Collection Time: 11/11/14 12:11 PM  Result Value Ref Range   Glucose-Capillary 202 (H) 70 - 99 mg/dL  Prealbumin     Status: Abnormal   Collection Time: 11/11/14  4:10 PM  Result Value Ref Range   Prealbumin 5.9 (L) 17.0 - 34.0 mg/dL    Comment: Performed at [facility]  Glucose, capillary     Status: Abnormal   Collection Time: 11/11/14  5:13 PM  Result Value Ref Range   Glucose-Capillary 154 (H) 70 - 99 mg/dL  Glucose, capillary     Status: Abnormal   Collection Time: 11/11/14  9:59 PM  Result Value Ref Range   Glucose-Capillary 158 (H) 70 - 99 mg/dL  CBC     Status: Abnormal   Collection Time: 11/12/14  4:55 AM  Result Value Ref Range   WBC 17.5 (H) 4.0 - 10.5 K/uL   RBC 2.72 (L) 4.22 - 5.81 MIL/uL   Hemoglobin 7.0 (L) 13.0 - 17.0 g/dL   HCT 22.1 (L) 39.0 - 52.0 %   MCV 81.3 78.0 - 100.0 fL   MCH 25.7 (L) 26.0 - 34.0 pg   MCHC 31.7 30.0 - 36.0 g/dL   RDW 16.4 (H) 11.5 - 15.5 %    Platelets 565 (H) 150 - 400 K/uL  Basic  metabolic panel     Status: Abnormal   Collection Time: 11/12/14  4:55 AM  Result Value Ref Range   Sodium 138 135 - 145 mmol/L   Potassium 4.6 3.5 - 5.1 mmol/L   Chloride 111 96 - 112 mmol/L   CO2 23 19 - 32 mmol/L   Glucose, Bld 160 (H) 70 - 99 mg/dL   BUN 15 6 - 23 mg/dL   Creatinine, Ser 1.15 0.50 - 1.35 mg/dL   Calcium 10.3 8.4 - 10.5 mg/dL   GFR calc non Af Amer 84 (L) >90 mL/min   GFR calc Af Amer >90 >90 mL/min    Comment: (NOTE) The eGFR has been calculated using the CKD EPI equation. This calculation has not been validated in all clinical situations. eGFR's persistently <90 mL/min signify possible Chronic Kidney Disease.    Anion gap 4 (L) 5 - 15  Clostridium Difficile by PCR     Status: None   Collection Time: 11/12/14  8:41 AM  Result Value Ref Range   C difficile by pcr NEGATIVE NEGATIVE  Glucose, capillary     Status: None   Collection Time: 11/12/14  8:44 AM  Result Value Ref Range   Glucose-Capillary 91 70 - 99 mg/dL  Glucose, capillary     Status: Abnormal   Collection Time: 11/12/14 11:40 AM  Result Value Ref Range   Glucose-Capillary 35 (LL) 70 - 99 mg/dL   Comment 1 Notify RN   Glucose, capillary     Status: Abnormal   Collection Time: 11/12/14 12:15 PM  Result Value Ref Range   Glucose-Capillary 111 (H) 70 - 99 mg/dL  I-STAT 4, (NA,K, GLUC, HGB,HCT)     Status: Abnormal   Collection Time: 11/12/14  2:08 PM  Result Value Ref Range   Sodium 141 135 - 145 mmol/L   Potassium 4.8 3.5 - 5.1 mmol/L   Glucose, Bld 56 (L) 70 - 99 mg/dL   HCT 23.0 (L) 39.0 - 52.0 %   Hemoglobin 7.8 (L) 13.0 - 17.0 g/dL  Glucose, capillary     Status: Abnormal   Collection Time: 11/12/14  2:31 PM  Result Value Ref Range   Glucose-Capillary 130 (H) 70 - 99 mg/dL   Comment 1 Documented in Chart    Comment 2 Notify RN   Glucose, capillary     Status: Abnormal   Collection Time: 11/12/14  5:20 PM  Result Value Ref Range    Glucose-Capillary 31 (LL) 70 - 99 mg/dL   Comment 1 Notify RN   Glucose, capillary     Status: None   Collection Time: 11/12/14  5:29 PM  Result Value Ref Range   Glucose-Capillary 97 70 - 99 mg/dL    No results found.  Review of Systems  Constitutional: Negative.  Negative for fever and chills.  HENT: Negative.   Eyes: Negative.   Respiratory: Negative.   Cardiovascular: Negative.   Gastrointestinal: Negative.  Negative for nausea.  Genitourinary: Negative.  Negative for flank pain.  Musculoskeletal: Positive for joint pain.  Skin: Negative.   Neurological: Positive for focal weakness.  Endo/Heme/Allergies: Negative.   Psychiatric/Behavioral: Negative.    Blood pressure 89/63, pulse 68, temperature 97.7 F (36.5 C), temperature source Oral, resp. rate 26, height 5' 8"  (1.727 m), weight 70.4 kg (155 lb 3.3 oz), SpO2 100 %. Physical Exam  Constitutional:  Ill-appearing, stigmata of chronic illness. Mother at bedside  HENT:  Head: Normocephalic.  Eyes: Pupils are equal, round, and reactive to light.  Neck: Normal range of motion.  Cardiovascular: Normal rate.   Respiratory: Effort normal.  GI: Soft.  colosomty in place  Genitourinary:  SPT in place with clear urine. Mild penoscrotal edema c/w prolonged supine.   Musculoskeletal:  Unna boots on bilat LE. woudn vac apparatus on sacrum.   Neurological: He is alert.  Skin: Skin is warm.  Psychiatric: He has a normal mood and affect. His behavior is normal.    Current SPT removed. Using aseptic techniqe betadine applied to area of SPT and new 49F silver lined SPT placed to 4 inches inflated 10cc sterile water. Irrigated quantitiatively with 30cc NS to confirm position and connected to gravity drain ensuring SPT off tension. Mother showed process step by step to begin familiarizing her with process.   Assessment/Plan:  1 - Neurogenic Bladder - Continue SPT. Changed today as per above. Explained to mother and RN need to keep  of tension.   2-  Bacteruria - Unlikely source of osteo in this case, but possible. WCX / BCX pending. Either way SPT changed today.   3 - WIll follow prn while in house, call with questions.   Jason [name] 11/12/2014, 5:56 PM

## 2014-11-12 NOTE — OR Nursing (Signed)
Hypoglycemic event CBG 31 on arrival to 5W 30 from PACU. D50W 25 ml given IV. Repeat CBG 97

## 2014-11-12 NOTE — Interval H&P Note (Signed)
History and Physical Interval Note:  11/12/2014 12:00 PM  Thayer  has presented today for surgery, with the diagnosis of ISCHIAL AND SACRAL ULCER   The various methods of treatment have been discussed with the patient and family. After consideration of risks, benefits and other options for treatment, the patient has consented to  Procedure(s): IRRIGATION AND DEBRIDEMENT OF WOUNDS WITH BONE BIOPSY AND SURGICAL PREP  (N/A) APPLICATION A CELL AND VAC  (N/A) as a surgical intervention .  The patient's history has been reviewed, patient examined, no change in status, stable for surgery.  I have reviewed the patient's chart and labs.  Questions were answered to the patient's satisfaction.     SANGER,Sheza Strickland

## 2014-11-12 NOTE — Anesthesia Preprocedure Evaluation (Addendum)
Anesthesia Evaluation  Patient identified by MRN, date of birth, ID band Patient awake    Reviewed: Allergy & Precautions, NPO status , Patient's Chart, lab work & pertinent test results  Airway Mallampati: III  TM Distance: >3 FB Neck ROM: Full    Dental  (+) Missing, Dental Advisory Given Extremely poor dentition:   Pulmonary asthma , Current Smoker,          Cardiovascular negative cardio ROS      Neuro/Psych Seizures -,  CVA (Spinal stroke 01/2014 with resultant tetraplegia), Residual Symptoms    GI/Hepatic Neg liver ROS, GERD-  ,  Endo/Other  diabetes, Poorly Controlled, Type 1, Insulin Dependent  Renal/GU ARFRenal disease     Musculoskeletal  (+) Arthritis -, Fibromyalgia -, narcotic dependent  Abdominal   Peds  Hematology  (+) anemia ,   Anesthesia Other Findings   Reproductive/Obstetrics                          Anesthesia Physical Anesthesia Plan  ASA: III  Anesthesia Plan: General   Post-op Pain Management:    Induction: Intravenous  Airway Management Planned: Oral ETT  Additional Equipment:   Intra-op Plan:   Post-operative Plan: Extubation in OR  Informed Consent: I have reviewed the patients History and Physical, chart, labs and discussed the procedure including the risks, benefits and alternatives for the proposed anesthesia with the patient or authorized representative who has indicated his/her understanding and acceptance.   Dental advisory given  Plan Discussed with: CRNA, Anesthesiologist and Surgeon  Anesthesia Plan Comments:      Anesthesia Quick Evaluation

## 2014-11-12 NOTE — Progress Notes (Signed)
[facility] for Infectious Disease    Subjective: Very sleepy today Antibiotics:  Anti-infectives    Start     Dose/Rate Route Frequency Ordered Stop   11/12/14 1317  polymyxin B 500,000 Units, bacitracin 50,000 Units in sodium chloride irrigation 0.9 % 500 mL irrigation       As needed 11/12/14 1319     11/09/14 1400  linezolid (ZYVOX) tablet 600 mg     600 mg Oral Every 12 hours 11/09/14 1319     11/09/14 0700  vancomycin (VANCOCIN) 500 mg in sodium chloride 0.9 % 100 mL IVPB  Status:  Discontinued     500 mg100 mL/hr over 60 Minutes Intravenous Every 12 hours 11/08/14 1732 11/08/14 1737   11/08/14 2200  vancomycin (VANCOCIN) IVPB 750 mg/150 ml premix  Status:  Discontinued     750 mg150 mL/hr over 60 Minutes Intravenous Every 24 hours 11/08/14 1737 11/09/14 1319   11/08/14 1800  meropenem (MERREM) 1 g in sodium chloride 0.9 % 100 mL IVPB     1 g200 mL/hr over 30 Minutes Intravenous Every 8 hours 11/08/14 1732     11/08/14 1745  vancomycin (VANCOCIN) 1,250 mg in sodium chloride 0.9 % 250 mL IVPB  Status:  Discontinued     1,250 mg166.7 mL/hr over 90 Minutes Intravenous  Once 11/08/14 1732 11/08/14 1734      Medications: Scheduled Meds: . atorvastatin  20 mg Oral q1800  . baclofen  10 mg Oral BID WC  . baclofen  20 mg Oral QHS  . feeding supplement (GLUCERNA SHAKE)  237 mL Oral TID BM  . feeding supplement (PRO-STAT SUGAR FREE 64)  30 mL Oral Daily  . fentaNYL  25 mcg Transdermal Q72H  . glucagon (human recombinant)      . heparin  5,000 Units Subcutaneous 3 times per day  . insulin aspart  0-5 Units Subcutaneous QHS  . insulin aspart  0-9 Units Subcutaneous TID WC  . insulin glargine  25 Units Subcutaneous QHS  . linezolid  600 mg Oral Q12H  . loratadine  10 mg Oral Daily  . meropenem (MERREM) IV  1 g Intravenous Q8H  . pantoprazole  40 mg Oral Daily  . potassium chloride SA  20 mEq Oral Daily  . pregabalin  100 mg Oral TID AC  . pregabalin  200 mg Oral QHS    Continuous Infusions:  PRN Meds:.ALPRAZolam, bacitracin, bisacodyl, gi cocktail, guaiFENesin-dextromethorphan, metoCLOPramide, ondansetron **OR** ondansetron (ZOFRAN) IV, oxyCODONE-acetaminophen **AND** oxyCODONE, polyethylene glycol, polymyxin / bacitracin (DOUBLE ANTIBIOTIC) irrigation, sodium chloride, sodium chloride, sodium chloride irrigation    Objective: Weight change:   Intake/Output Summary (Last 24 hours) at 11/12/14 1405 Last data filed at 11/12/14 9528  Gross per 24 hour  Intake    130 ml  Output   3025 ml  Net  -2895 ml   Blood pressure 96/61, pulse 87, temperature 98.4 F (36.9 C), temperature source Oral, resp. rate 16, height 5\' 8"  (1.727 m), weight 155 lb 3.3 oz (70.4 kg), SpO2 100 %. Temp:  [98.4 F (36.9 C)-98.8 F (37.1 C)] 98.4 F (36.9 C) (02/05 0603) Pulse Rate:  [81-87] 87 (02/05 0603) Resp:  [13-17] 16 (02/05 0603) BP: (96-105)/(61-73) 96/61 mmHg (02/05 0603) SpO2:  [100 %] 100 % (02/05 0603)  Physical Exam: General: Sleepy but arousable getting ready go to the operating room. Skin: wound vacuum in place, area where blistered skin has denuded is stable Neuro: paraplegic   CBC:  CBC Latest Ref  Rng 11/12/2014 11/11/2014 11/10/2014  WBC 4.0 - 10.5 K/uL 17.5(H) 19.5(H) 21.8(H)  Hemoglobin 13.0 - 17.0 g/dL 7.0(L) 7.1(L) 7.0(L)  Hematocrit 39.0 - 52.0 % 22.1(L) 22.3(L) 21.4(L)  Platelets 150 - 400 K/uL 565(H) 590(H) 542(H)       BMET  Recent Labs  11/11/14 0500 11/12/14 0455  NA 137 138  K 4.1 4.6  CL 113* 111  CO2 21 23  GLUCOSE 127* 160*  BUN 12 15  CREATININE 1.06 1.15  CALCIUM 10.7* 10.3     Liver Panel  No results for input(s): PROT, ALBUMIN, AST, ALT, ALKPHOS, BILITOT, BILIDIR, IBILI in the last 72 hours.     Sedimentation Rate  Recent Labs  11/10/14 0425  ESRSEDRATE 87*   C-Reactive Protein  Recent Labs  11/10/14 0425  CRP 22.1*    Micro Results: Recent Results (from the past 720 hour(s))  Urine culture      Status: None   Collection Time: 11/08/14  3:13 PM  Result Value Ref Range Status   Specimen Description URINE, CATHETERIZED  Final   Special Requests NONE  Final   Colony Count   Final    >=100,000 COLONIES/ML Performed at [facility] [place] Performed at [facility]    Report Status 11/11/2014 FINAL  Final   Organism ID, Bacteria BURKHOLDERIA CEPACIA  Final      Susceptibility   Burkholderia cepacia - MIC*    LEVOFLOXACIN 4 INTERMEDIATE Intermediate     TRIMETH/SULFA 160 RESISTANT Resistant     CEFTAZIDIME 8 SENSITIVE Sensitive     * BURKHOLDERIA CEPACIA  Culture, blood (routine x 2)     Status: None (Preliminary result)   Collection Time: 11/08/14  5:25 PM  Result Value Ref Range Status   Specimen Description BLOOD ARM LEFT  Final   Special Requests BOTTLES DRAWN AEROBIC AND ANAEROBIC 10CC  Final   Culture   Final    GRAM NEGATIVE RODS Note: Culture results may be compromised due to an excessive volume of blood received in culture bottles. Gram Stain Report Called to,Read Back By and Verified With: Juanetta Beets 11/12/14 0144A [place] Performed at [facility]    Report Status PENDING  Incomplete  Culture, blood (routine x 2)     Status: None (Preliminary result)   Collection Time: 11/08/14  5:35 PM  Result Value Ref Range Status   Specimen Description BLOOD ARM LEFT  Final   Special Requests BOTTLES DRAWN AEROBIC AND ANAEROBIC 10CC  Final   Culture   Final           BLOOD CULTURE RECEIVED NO GROWTH TO DATE CULTURE WILL BE HELD FOR 5 DAYS BEFORE ISSUING A FINAL NEGATIVE REPORT Performed at [facility]    Report Status PENDING  Incomplete  Clostridium Difficile by PCR     Status: None   Collection Time: 11/12/14  8:41 AM  Result Value Ref Range Status   C difficile by pcr NEGATIVE NEGATIVE Final    Studies/Results: Mr Pelvis W Wo Contrast  11/10/2014   CLINICAL DATA:  Quadriplegia. Sacral decubitus  ulcer. Sepsis. Foul-smelling discharge from the decubitus ulcer.  EXAM: MRI PELVIS WITHOUT AND WITH CONTRAST  TECHNIQUE: Multiplanar multisequence MR imaging of the pelvis was performed both before and after administration of intravenous contrast.  CONTRAST:  60mL MULTIHANCE GADOBENATE DIMEGLUMINE 529 MG/ML IV SOLN  COMPARISON:  10/02/2014  FINDINGS: Again noted are decubitus ulcers over  the ischial tuberosities, right larger than left. The right decubitus ulcer extends down to the ischial tuberosity with underlying marrow edema and cortical irregularity consistent with osteomyelitis. There is mild diffuse edema of the pelvic musculature with mild enhancement. There are interval development of multiple small nonenhancing fluid collections within the right adductor magnus and adductor brevis muscles extending from the right ischial tuberosity most consistent with intramuscular abscesses. The conglomeration of the area measures approximately 4.8 x 2 cm, but each individual collection measures significantly smaller than this. There is a rind of nonenhancement around the wound consistent with necrosis.  There is no marrow signal abnormality involving the left ischial tuberosity.  Bilateral hips demonstrate no fracture, dislocation or avascular necrosis. There is a small right hip joint effusion with mild synovial enhancement. There is no bursa formation. There is generalized edema within the subcutaneous fat throughout the pelvis.  IMPRESSION: 1. Again noted is a decubitus ulcer over the ischial tuberosities bilaterally, right larger than left. The right decubitus ulcer extends down to the ischial tuberosity with underlying marrow edema, cortical irregularity and enhancement consistent with osteomyelitis similar in appearance to the prior exam. There is mild diffuse edema throughout the pelvic musculature with mild enhancement with interval development of small nonenhancing fluid collections within the right adductor  magnus and adductor brevis muscles extending from the right ischial tuberosity most consistent with intramuscular abscesses. 2. Small right joint effusion with mild synovial enhancement. Septic arthritis cannot be excluded. If there is clinical concern recommend arthrocentesis. 3. Decubitus ulcer overlying the left ischial tuberosity without underlying osseous abnormality. 4. There is diffuse mild edema of the pelvic musculature which is likely neurogenic. There is a concern of an element of infectious myositis especially in the right adductor and gluteal compartment.   Electronically Signed   By: Kathreen Devoid   On: 11/10/2014 17:12      Assessment/Plan:  Active Problems:   Type 1 diabetes, uncontrolled, with neuropathy   GERD   Gastroparesis due to DM   Protein-calorie malnutrition, severe   Spinal cord infarction (history of)   Tetraplegia   Neurogenic bladder   Sepsis   Severe sepsis   Stage IV decubitus ulcer   Osteomyelitis   Osteomyelitis, pelvis   AKI (acute kidney injury)   Decubitus ulcer   Osteomyelitis, pelvic region and thigh   Septic hip   Burkholderia cepacia infection   Urinary tract infectious disease   Suprapubic catheter    Jason [name] is a 29 y.o. male with Paraplegia, prior MSSA bacteremia, decubitus ulcers, pelvic osteomyelitis sp I and D by Dr Grandville Silos with bone cultures sent in December on vancomycin which grew multiple organisms but no staph or strep. Patient went home on IV vancomycin alone.  In the interim he appears to have developed a blistering of his skin which occurred while his wound had packing material in place for protracted amount of time due to the fact that he did not have a home health nurse to come visit him due to the winter weather. Since that is yet also had worsening foul-smelling drainage from his midline large decubitus ulcer that appears worse on exam today than when I saw him last. MRI done today shows ischial osteomyelitis on the  right that is stable on exam, nonenhancing fluid collections withinthe right adductor magnus and adductor brevis muscles extending fromthe right ischial tuberosity consistent with intramuscular Abscesses, Small right joint effusion with mild synovial enhancement, ulcer of the left ischial tuberosity without underlying bone  abnormalities, edema of the pelvic musculature.    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 Migdalia Dk taking patient to the OR today surgery and obtaining deep cultures. Also Greatly appreciate Dr. Marlou Sa from Orthopedics seeing the patient  --followup OPERATIVE CULTURES  --Agree with ASPIRATE BY FLUORO OF HIP FOR CELL COUNT AND DIFFERETIAL, CULTURE IF THERE IS ANY FLUID LEFT AFTER CELL COUNT AN DIFF PERFORMED   --I have left him on  zyvox and meropenem for now   #2  GNR in 1/2 blood cultures: Does also be Burkholderia continue meropenem and follow-up blood cultures I have repeated blood cultures today  #3 Burkholderia cepacia UTI:   This is a MDR pathogen typically and NOT typically found in urine but rather in the lungs of CF patients, Polly immunocompromised patients. I did find right report of an outbreak associated with lubricating jelly was used to insert catheters.  --I wouldwith Dr. McDiarmid with Urology to consider whether worth exchanging this suprapubic catheter during this admission (it was due to be replaced in 2 weeks)    #4 Blistering rash; I had concern this could have been due to Staph Toxic shock syndrome due to packing in the wound and therefore exchanged his vancomycin for zyvox for toxin inhibition, more simple explanation Plastics suggested this could be burn from a heating pad  #5 Diabetic foot: I will re-examine this on Monday and consider it he needs any further studies done for this  Dr. Baxter Flattery will be covering this weekend and is available for questions.        LOS: 4 days   [name] 11/12/2014, 2:05 PM

## 2014-11-12 NOTE — Clinical Social Work Note (Signed)
CSW continues to follow patient as patient will require ambulance transport at discharge if he is denied CIR admission.   Liz Beach MSW, [place], [place], [phone]

## 2014-11-12 NOTE — Anesthesia Procedure Notes (Signed)
Procedure Name: Intubation Date/Time: 11/12/2014 1:00 PM Performed by: Carney Living Pre-anesthesia Checklist: Patient identified, Emergency Drugs available, Suction available, Patient being monitored and Timeout performed Patient Re-evaluated:Patient Re-evaluated prior to inductionOxygen Delivery Method: Circle system utilized Preoxygenation: Pre-oxygenation with 100% oxygen Intubation Type: IV induction Ventilation: Mask ventilation without difficulty Laryngoscope Size: Mac and 4 Grade View: Grade II Tube type: Oral Tube size: 7.5 mm Number of attempts: 1 Airway Equipment and Method: Stylet Placement Confirmation: ETT inserted through vocal cords under direct vision,  positive ETCO2 and breath sounds checked- equal and bilateral Secured at: 22 cm Tube secured with: Tape Dental Injury: Teeth and Oropharynx as per pre-operative assessment

## 2014-11-12 NOTE — Anesthesia Postprocedure Evaluation (Signed)
  Anesthesia Post-op Note  Patient: Jason [name]  Procedure(s) Performed: Procedure(s): IRRIGATION AND DEBRIDEMENT OF WOUNDS WITH BONE BIOPSY AND SURGICAL PREP  (N/A) APPLICATION A CELL AND VAC  (N/A)  Patient Location: PACU  Anesthesia Type:General  Level of Consciousness: awake and alert   Airway and Oxygen Therapy: Patient Spontanous Breathing  Post-op Pain: none  Post-op Assessment: Post-op Vital signs reviewed  Post-op Vital Signs: Reviewed  Last Vitals:  Filed Vitals:   11/12/14 1600  BP:   Pulse: 69  Temp:   Resp: 15    Complications: No apparent anesthesia complications

## 2014-11-12 NOTE — Transfer of Care (Signed)
Immediate Anesthesia Transfer of Care Note  Patient: Jason [name]  Procedure(s) Performed: Procedure(s): IRRIGATION AND DEBRIDEMENT OF WOUNDS WITH BONE BIOPSY AND SURGICAL PREP  (N/A) APPLICATION A CELL AND VAC  (N/A)  Patient Location: PACU  Anesthesia Type:General  Level of Consciousness: awake and patient cooperative  Airway & Oxygen Therapy: Patient Spontanous Breathing and Patient connected to nasal cannula oxygen  Post-op Assessment: Report given to RN and Post -op Vital signs reviewed and stable  Post vital signs: Reviewed and stable  Last Vitals:  Filed Vitals:   11/12/14 0603  BP: 96/61  Pulse: 87  Temp: 36.9 C  Resp: 16    Complications: No apparent anesthesia complications

## 2014-11-12 NOTE — Progress Notes (Signed)
CRITICAL VALUE ALERT  Critical value received:  Blood culture (Gram Negative Rods)  Date of notification:  11/12/2014  Time of notification:  144  Critical value read back: Yes  Nurse who received alert:  Juanetta Beets RN  MD notified (1st page):  Rogue Bussing  Time of first page:  209  MD notified (2nd page):  Time of second page:  Responding MD:    Time MD responded:

## 2014-11-12 NOTE — Progress Notes (Signed)
Hypoglycemic Event  CBG: 35  Treatment: D50 IV 50 mL  Symptoms: None  Follow-up CBG: Time: reported to the OR nurse to recheck  CBG Result:111  Possible Reasons for Event: Inadequate meal intake     Jason [name]  Remember to initiate Hypoglycemia Order Set & complete

## 2014-11-12 NOTE — Progress Notes (Signed)
PROGRESS NOTE  Jason [name] HAL:[id number] DOB: Mar 09, 1986 DOA: 11/08/2014 PCP: [name], MD  Assessment/Plan: Sepsis  -secondary to sacral wound infection/pelvic osteomyelitis/intramuscular abscesses -appreciate ID consult -abx per ID -IVF -stool for Cdiff--neg -lactic acid in am -WBC trending down Bacteremia -GNR from 2 of 2 sets on 11/08/14 -continue merrem pending culture Delirium -secondary to opioids -More alert since discontinuation of intravenous opioids -pt had this issue on previous admissions -fine balance between pain control and delirium -discussed with mother at bedside-->d/c Dilaudid, restart home percocet Pelvic osteomyelitis/intramuscular abscess/septic arthritis -Failed IV Vanco for the last 3 weeks via PICC -Continue Zyvox and meropenem -appreciate plastic surgery--appreciate Dr. Vevelyn Francois 11/12/14 with VAC -MRI pelvis--right if she'll tuberosity with marrow edema, intramuscular abscesses, small right joint effusion with enhancement -appreciate Dr. Dennison Mascot noted for aspiration under fluoro -please send aspirate for cell count and differential---if any fluid left over send for culture Burkholderia Bacteruria -unusual organism in urine -spoke with urology, Dr. Tresa Moore, who will try to arrange exchange of suprapubic cath AKI -secondary to sepsis -improving Protein calorie malnutrition -Continue protein supplement Diabetes mellitus type 1 -Continue Lantus and NovoLog sliding scale - hemoglobin A1c--8.8 -11/12/14--pt had hypoglycemia today due to being npo for surgery-->decrease lantus to 20 units Spinal cord infarction history  -Patient has suprapubic catheter placed by Dr. McDiarmid 3 weeks ago Tobacco abuse -counseled to quit Iron deficiency anemia  -Iron saturation 6%  -Start ferrous sulfate    Family Communication: Mother updated at beside Disposition Plan: Home when medically  stable        Procedures/Studies: Mr Pelvis W Wo Contrast  11/10/2014   CLINICAL DATA:  Quadriplegia. Sacral decubitus ulcer. Sepsis. Foul-smelling discharge from the decubitus ulcer.  EXAM: MRI PELVIS WITHOUT AND WITH CONTRAST  TECHNIQUE: Multiplanar multisequence MR imaging of the pelvis was performed both before and after administration of intravenous contrast.  CONTRAST:  30mL MULTIHANCE GADOBENATE DIMEGLUMINE 529 MG/ML IV SOLN  COMPARISON:  10/02/2014  FINDINGS: Again noted are decubitus ulcers over the ischial tuberosities, right larger than left. The right decubitus ulcer extends down to the ischial tuberosity with underlying marrow edema and cortical irregularity consistent with osteomyelitis. There is mild diffuse edema of the pelvic musculature with mild enhancement. There are interval development of multiple small nonenhancing fluid collections within the right adductor magnus and adductor brevis muscles extending from the right ischial tuberosity most consistent with intramuscular abscesses. The conglomeration of the area measures approximately 4.8 x 2 cm, but each individual collection measures significantly smaller than this. There is a rind of nonenhancement around the wound consistent with necrosis.  There is no marrow signal abnormality involving the left ischial tuberosity.  Bilateral hips demonstrate no fracture, dislocation or avascular necrosis. There is a small right hip joint effusion with mild synovial enhancement. There is no bursa formation. There is generalized edema within the subcutaneous fat throughout the pelvis.  IMPRESSION: 1. Again noted is a decubitus ulcer over the ischial tuberosities bilaterally, right larger than left. The right decubitus ulcer extends down to the ischial tuberosity with underlying marrow edema, cortical irregularity and enhancement consistent with osteomyelitis similar in appearance to the prior exam. There is mild diffuse edema throughout the pelvic  musculature with mild enhancement with interval development of small nonenhancing fluid collections within the right adductor magnus and adductor brevis muscles extending from the right ischial tuberosity most consistent with intramuscular abscesses. 2. Small right joint effusion with mild synovial enhancement. Septic arthritis  cannot be excluded. If there is clinical concern recommend arthrocentesis. 3. Decubitus ulcer overlying the left ischial tuberosity without underlying osseous abnormality. 4. There is diffuse mild edema of the pelvic musculature which is likely neurogenic. There is a concern of an element of infectious myositis especially in the right adductor and gluteal compartment.   Electronically Signed   By: Kathreen Devoid   On: 11/10/2014 17:12   Dg Chest Port 1 View  11/08/2014   CLINICAL DATA:  Cough, elevated white count  EXAM: PORTABLE CHEST - 1 VIEW  COMPARISON:  10/10/2014  FINDINGS: Consolidation in the left lower lobe, similar to prior study. Increasing right medial basilar airspace opacity also noted. Findings compatible with pneumonia. No effusions. Right PICC are remains in place, unchanged.  IMPRESSION: Stable left lower lobe pneumonia. Increasing right medial basilar density, also likely pneumonia.   Electronically Signed   By: Rolm Baptise M.D.   On: 11/08/2014 18:48         Subjective:   Objective: Filed Vitals:   11/12/14 1615 11/12/14 1630 11/12/14 1645 11/12/14 1805  BP:   109/76 96/64  Pulse: 71 71 68 66  Temp:    97.5 F (36.4 C)  TempSrc:    Oral  Resp: 14 13 26 14   Height:      Weight:      SpO2: 100% 100% 100% 100%    Intake/Output Summary (Last 24 hours) at 11/12/14 1827 Last data filed at 11/12/14 1645  Gross per 24 hour  Intake   1410 ml  Output   3250 ml  Net  -1840 ml   Weight change:  Exam:   General:  Pt is alert, follows commands appropriately, not in acute distress  HEENT: No icterus, No thrush, No neck mass, [employer]/AT  Cardiovascular:  RRR, S1/S2, no rubs, no gallops  Respiratory: CTA bilaterally, no wheezing, no crackles, no rhonchi  Abdomen: Soft/+BS, non tender, non distended, no guarding  Extremities: No edema, No lymphangitis, No petechiae, No rashes, no synovitis  Data Reviewed: Basic Metabolic Panel:  Recent Labs Lab 11/08/14 1534 11/08/14 1646 11/09/14 0425 11/10/14 0425 11/11/14 0500 11/12/14 0455 11/12/14 1408  NA 134* 138 136 136 137 138 141  K 4.0 4.0 3.9 3.9 4.1 4.6 4.8  CL 109 108 111 112 113* 111  --   CO2 20  --  20 22 21 23   --   GLUCOSE 152* 149* 164* 127* 127* 160* 56*  BUN 21 21 18 12 12 15   --   CREATININE 1.55* 1.60* 1.36* 1.30 1.06 1.15  --   CALCIUM 10.6*  --  10.2 10.1 10.7* 10.3  --    Liver Function Tests:  Recent Labs Lab 11/08/14 1534  AST 10  ALT 10  ALKPHOS 144*  BILITOT 0.6  PROT 6.8  ALBUMIN 1.9*   No results for input(s): LIPASE, AMYLASE in the last 168 hours. No results for input(s): AMMONIA in the last 168 hours. CBC:  Recent Labs Lab 11/08/14 1534  11/09/14 0425 11/10/14 0425 11/11/14 0500 11/12/14 0455 11/12/14 1408  WBC 24.5*  --  24.1* 21.8* 19.5* 17.5*  --   NEUTROABS 19.4*  --   --   --   --   --   --   HGB 7.9*  < > 7.3* 7.0* 7.1* 7.0* 7.8*  HCT 24.7*  < > 23.0* 21.4* 22.3* 22.1* 23.0*  MCV 80.2  --  82.4 82.0 80.8 81.3  --   PLT 508*  --  526* 542* 590* 565*  --   < > = values in this interval not displayed. Cardiac Enzymes: No results for input(s): CKTOTAL, CKMB, CKMBINDEX, TROPONINI in the last 168 hours. BNP: Invalid input(s): POCBNP CBG:  Recent Labs Lab 11/12/14 1140 11/12/14 1215 11/12/14 1431 11/12/14 1720 11/12/14 1729  GLUCAP 35* 111* 130* 31* 97    Recent Results (from the past 240 hour(s))  Urine culture     Status: None   Collection Time: 11/08/14  3:13 PM  Result Value Ref Range Status   Specimen Description URINE, CATHETERIZED  Final   Special Requests NONE  Final   Colony Count   Final    >=100,000  COLONIES/ML Performed at [place]   Final    BURKHOLDERIA CEPACIA Performed at [facility]    Report Status 11/11/2014 FINAL  Final   Organism ID, Bacteria BURKHOLDERIA CEPACIA  Final      Susceptibility   Burkholderia cepacia - MIC*    LEVOFLOXACIN 4 INTERMEDIATE Intermediate     TRIMETH/SULFA 160 RESISTANT Resistant     CEFTAZIDIME 8 SENSITIVE Sensitive     * BURKHOLDERIA CEPACIA  Culture, blood (routine x 2)     Status: None (Preliminary result)   Collection Time: 11/08/14  5:25 PM  Result Value Ref Range Status   Specimen Description BLOOD ARM LEFT  Final   Special Requests BOTTLES DRAWN AEROBIC AND ANAEROBIC 10CC  Final   Culture   Final    GRAM NEGATIVE RODS Note: Culture results may be compromised due to an excessive volume of blood received in culture bottles. Gram Stain Report Called to,Read Back By and Verified With: Juanetta Beets 11/12/14 0144A [place] Performed at [facility]    Report Status PENDING  Incomplete  Culture, blood (routine x 2)     Status: None (Preliminary result)   Collection Time: 11/08/14  5:35 PM  Result Value Ref Range Status   Specimen Description BLOOD ARM LEFT  Final   Special Requests BOTTLES DRAWN AEROBIC AND ANAEROBIC 10CC  Final   Culture   Final    GRAM NEGATIVE RODS Note: Gram Stain Report Called to,Read Back By and Verified With: KATIE B RN @ [id number] 11/12/14 Performed at [facility]    Report Status PENDING  Incomplete  Clostridium Difficile by PCR     Status: None   Collection Time: 11/12/14  8:41 AM  Result Value Ref Range Status   C difficile by pcr NEGATIVE NEGATIVE Final     Scheduled Meds: . atorvastatin  20 mg Oral q1800  . baclofen  10 mg Oral BID WC  . baclofen  20 mg Oral QHS  . feeding supplement (GLUCERNA SHAKE)  237 mL Oral TID BM  . feeding supplement (PRO-STAT SUGAR FREE 64)  30 mL Oral Daily  . fentaNYL  25 mcg Transdermal Q72H  . glucagon (human  recombinant)      . heparin  5,000 Units Subcutaneous 3 times per day  . insulin aspart  0-5 Units Subcutaneous QHS  . insulin aspart  0-9 Units Subcutaneous TID WC  . insulin aspart  4 Units Subcutaneous TID AC  . insulin glargine  25 Units Subcutaneous QHS  . linezolid  600 mg Oral Q12H  . loratadine  10 mg Oral Daily  . meropenem (MERREM) IV  1 g Intravenous Q8H  . oxyCODONE  15 mg Oral Q6H  . pantoprazole  40 mg Oral Daily  . potassium  chloride SA  20 mEq Oral Daily  . pregabalin  100 mg Oral TID AC  . pregabalin  200 mg Oral QHS  . sodium chloride  500 mL Intravenous Once   Continuous Infusions:    Bhavya Eschete, DO  [facility] Pager [phone]  If 7PM-7AM, please contact night-coverage [email or web address] Password [email or web address] 11/12/2014, 6:27 PM   LOS: 4 days

## 2014-11-12 NOTE — Brief Op Note (Addendum)
11/08/2014 - 11/12/2014  1:50 PM  PATIENT:  Jason [name]  29 y.o. male  PRE-OPERATIVE DIAGNOSIS:  ISCHIAL AND SACRAL ULCER   POST-OPERATIVE DIAGNOSIS:  ISCHIAL AND SACRAL ULCER   PROCEDURE:  Procedure(s): IRRIGATION AND DEBRIDEMENT OF WOUNDS WITH BONE BIOPSY AND SURGICAL PREP  (N/A) APPLICATION A CELL AND VAC  (N/A)  SURGEON:  Surgeon(s) and Role:    * Reedy Biernat Sanger, DO - Primary  PHYSICIAN ASSISTANT:   ASSISTANTS: none   ANESTHESIA:   general  EBL:  Total I/O In: -  Out: 800 [Urine:800]  BLOOD ADMINISTERED:none  DRAINS: none   LOCAL MEDICATIONS USED:  NONE  SPECIMEN:  Source of Specimen:  bone  DISPOSITION OF SPECIMEN:  micro  COUNTS:  YES  TOURNIQUET:  * No tourniquets in log *  DICTATION: .Dragon Dictation  PLAN OF CARE: Admit to inpatient   PATIENT DISPOSITION:  PACU - hemodynamically stable.   Delay start of Pharmacological VTE agent (>24hrs) due to surgical blood loss or risk of bleeding: no

## 2014-11-12 NOTE — Consult Note (Addendum)
WOC: Plastics team plans to debride sacrum and ischium wounds in OR according to the EMR.  Please refer to their team for further assessment and plan of care. Please re-consult if further assistance is needed.  Thank-you,  Julien Girt MSN, [place], [place], [place], [place]

## 2014-11-12 NOTE — Progress Notes (Signed)
MD notified of blood cultures and blood sugars. Will continue to monitor.

## 2014-11-12 NOTE — H&P (View-Only) (Signed)
Reason for Consult:Sacral ulcers Referring Physician: Primary team  Jason [name] is an 29 y.o. male.  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  He has been dealing with sacral / ischial ulcers for several months.  The areas seemed to get worse recently and mom was concerned about the smell and size.  He has a burn looking area on his back that looks like a heating bad burn but the family does not think this was the cause.  He has multiple medical problems contributing to his overall status.  The ulcers on the sacral ischial area are ~ 5 cm and track to bone with non viable tissue at the base.  Past Medical History  Diagnosis Date  . GERD (gastroesophageal reflux disease)   . Asthma   . Hx MRSA infection     on face  . Gastroparesis   . Diabetic neuropathy   . Seizures   . Stroke     spinal stroke in 4/15  . Diabetes mellitus     sees Dr. Ellison   . Family history of anesthesia complication     Pt mother can't have epidural procedures  . Dysrhythmia   . Pneumonia   . Arthritis   . Fibromyalgia     Past Surgical History  Procedure Laterality Date  . Tonsillectomy    . Multiple extractions with alveoloplasty N/A 08/03/2014    Procedure: MULTIPLE EXTRACTIONS;  Surgeon: Scott M Jensen, DDS;  Location: [facility] OR;  Service: Oral Surgery;  Laterality: N/A;  . Tee without cardioversion N/A 08/17/2014    Procedure: TRANSESOPHAGEAL ECHOCARDIOGRAM (TEE);  Surgeon: Katarina H Nelson, MD;  Location: [facility];  Service: Cardiovascular;  Laterality: N/A;  . Debridment of decubitus ulcer N/A 10/04/2014    Procedure: DEBRIDMENT OF DECUBITUS ULCER;  Surgeon: Burke Thompson, MD;  Location: [facility] OR;  Service: General;  Laterality: N/A;  . Laparoscopic diverted colostomy N/A 10/12/2014    Procedure: LAPAROSCOPIC DIVERTING COLOSTOMY;  Surgeon: Matthew Tsuei, MD;  Location: [facility] OR;  Service: General;  Laterality: N/A;  . Insertion of suprapubic  catheter N/A 10/12/2014    Procedure: INSERTION OF SUPRAPUBIC CATHETER;  Surgeon: Scott A MacDiarmid, MD;  Location: [facility] OR;  Service: Urology;  Laterality: N/A;    Family History  Problem Relation Age of Onset  . Diabetes Father   . Hypertension Father   . Asthma      fhx  . Hypertension      fhx  . Stroke      fhx  . Heart disease Mother     Social History:  reports that he has been smoking Cigars and Cigarettes.  He has never used smokeless tobacco. He reports that he does not drink alcohol or use illicit drugs.  Allergies:  Allergies  Allergen Reactions  . Cefuroxime Axetil Anaphylaxis  . Morphine And Related Other (See Comments)    Changed mental status, confusion, headache, visual hallucination  . Penicillins Anaphylaxis and Other (See Comments)    ?can take amoxicillin?  . Sulfa Antibiotics Anaphylaxis, Shortness Of Breath and Other (See Comments)  . Tessalon [Benzonatate] Anaphylaxis  . Shellfish Allergy Itching and Other (See Comments)    Took benadryl to alleviate reaction    Medications: I have reviewed the patient's current medications.  Results for orders placed or performed during the hospital encounter of 11/08/14 (from the past 48 hour(s))  Glucose, capillary     Status: Abnormal     Collection Time: 11/09/14  4:34 PM  Result Value Ref Range   Glucose-Capillary 143 (H) 70 - 99 mg/dL  Glucose, capillary     Status: Abnormal   Collection Time: 11/09/14 10:07 PM  Result Value Ref Range   Glucose-Capillary 139 (H) 70 - 99 mg/dL   Comment 1 Documented in Chart    Comment 2 Notify RN   CBC     Status: Abnormal   Collection Time: 11/10/14  4:25 AM  Result Value Ref Range   WBC 21.8 (H) 4.0 - 10.5 K/uL   RBC 2.61 (L) 4.22 - 5.81 MIL/uL   Hemoglobin 7.0 (L) 13.0 - 17.0 g/dL   HCT 21.4 (L) 39.0 - 52.0 %   MCV 82.0 78.0 - 100.0 fL   MCH 26.4 26.0 - 34.0 pg   MCHC 32.2 30.0 - 36.0 g/dL   RDW 16.3 (H) 11.5 - 15.5 %   Platelets 542 (H) 150 - 400 K/uL  Basic  metabolic panel     Status: Abnormal   Collection Time: 11/10/14  4:25 AM  Result Value Ref Range   Sodium 136 135 - 145 mmol/L   Potassium 3.9 3.5 - 5.1 mmol/L   Chloride 112 96 - 112 mmol/L   CO2 22 19 - 32 mmol/L   Glucose, Bld 127 (H) 70 - 99 mg/dL   BUN 12 6 - 23 mg/dL   Creatinine, Ser 1.30 0.50 - 1.35 mg/dL   Calcium 10.1 8.4 - 10.5 mg/dL   GFR calc non Af Amer 72 (L) >90 mL/min   GFR calc Af Amer 84 (L) >90 mL/min    Comment: (NOTE) The eGFR has been calculated using the CKD EPI equation. This calculation has not been validated in all clinical situations. eGFR's persistently <90 mL/min signify possible Chronic Kidney Disease.    Anion gap 2 (L) 5 - 15    Comment: REPEATED TO VERIFY  Sedimentation rate     Status: Abnormal   Collection Time: 11/10/14  4:25 AM  Result Value Ref Range   Sed Rate 87 (H) 0 - 16 mm/hr  C-reactive protein     Status: Abnormal   Collection Time: 11/10/14  4:25 AM  Result Value Ref Range   CRP 22.1 (H) <0.60 mg/dL    Comment: Performed at [facility]  Glucose, capillary     Status: Abnormal   Collection Time: 11/10/14  7:55 AM  Result Value Ref Range   Glucose-Capillary 106 (H) 70 - 99 mg/dL  Glucose, capillary     Status: Abnormal   Collection Time: 11/10/14 12:19 PM  Result Value Ref Range   Glucose-Capillary 111 (H) 70 - 99 mg/dL  Glucose, capillary     Status: Abnormal   Collection Time: 11/10/14  5:21 PM  Result Value Ref Range   Glucose-Capillary 142 (H) 70 - 99 mg/dL  Glucose, capillary     Status: Abnormal   Collection Time: 11/10/14 10:09 PM  Result Value Ref Range   Glucose-Capillary 146 (H) 70 - 99 mg/dL   Comment 1 Documented in Chart    Comment 2 Notify RN   CBC     Status: Abnormal   Collection Time: 11/11/14  5:00 AM  Result Value Ref Range   WBC 19.5 (H) 4.0 - 10.5 K/uL   RBC 2.76 (L) 4.22 - 5.81 MIL/uL   Hemoglobin 7.1 (L) 13.0 - 17.0 g/dL   HCT 22.3 (L) 39.0 - 52.0 %   MCV 80.8 78.0 - 100.0 fL     MCH  25.7 (L) 26.0 - 34.0 pg   MCHC 31.8 30.0 - 36.0 g/dL   RDW 16.4 (H) 11.5 - 15.5 %   Platelets 590 (H) 150 - 400 K/uL  Basic metabolic panel     Status: Abnormal   Collection Time: 11/11/14  5:00 AM  Result Value Ref Range   Sodium 137 135 - 145 mmol/L   Potassium 4.1 3.5 - 5.1 mmol/L   Chloride 113 (H) 96 - 112 mmol/L   CO2 21 19 - 32 mmol/L   Glucose, Bld 127 (H) 70 - 99 mg/dL   BUN 12 6 - 23 mg/dL   Creatinine, Ser 1.06 0.50 - 1.35 mg/dL   Calcium 10.7 (H) 8.4 - 10.5 mg/dL   GFR calc non Af Amer >90 >90 mL/min   GFR calc Af Amer >90 >90 mL/min    Comment: (NOTE) The eGFR has been calculated using the CKD EPI equation. This calculation has not been validated in all clinical situations. eGFR's persistently <90 mL/min signify possible Chronic Kidney Disease.    Anion gap 3 (L) 5 - 15  Glucose, capillary     Status: Abnormal   Collection Time: 11/11/14  8:05 AM  Result Value Ref Range   Glucose-Capillary 118 (H) 70 - 99 mg/dL  Glucose, capillary     Status: Abnormal   Collection Time: 11/11/14 12:11 PM  Result Value Ref Range   Glucose-Capillary 202 (H) 70 - 99 mg/dL    Mr Pelvis W Wo Contrast  11/10/2014   CLINICAL DATA:  Quadriplegia. Sacral decubitus ulcer. Sepsis. Foul-smelling discharge from the decubitus ulcer.  EXAM: MRI PELVIS WITHOUT AND WITH CONTRAST  TECHNIQUE: Multiplanar multisequence MR imaging of the pelvis was performed both before and after administration of intravenous contrast.  CONTRAST:  13mL MULTIHANCE GADOBENATE DIMEGLUMINE 529 MG/ML IV SOLN  COMPARISON:  10/02/2014  FINDINGS: Again noted are decubitus ulcers over the ischial tuberosities, right larger than left. The right decubitus ulcer extends down to the ischial tuberosity with underlying marrow edema and cortical irregularity consistent with osteomyelitis. There is mild diffuse edema of the pelvic musculature with mild enhancement. There are interval development of multiple small nonenhancing fluid  collections within the right adductor magnus and adductor brevis muscles extending from the right ischial tuberosity most consistent with intramuscular abscesses. The conglomeration of the area measures approximately 4.8 x 2 cm, but each individual collection measures significantly smaller than this. There is a rind of nonenhancement around the wound consistent with necrosis.  There is no marrow signal abnormality involving the left ischial tuberosity.  Bilateral hips demonstrate no fracture, dislocation or avascular necrosis. There is a small right hip joint effusion with mild synovial enhancement. There is no bursa formation. There is generalized edema within the subcutaneous fat throughout the pelvis.  IMPRESSION: 1. Again noted is a decubitus ulcer over the ischial tuberosities bilaterally, right larger than left. The right decubitus ulcer extends down to the ischial tuberosity with underlying marrow edema, cortical irregularity and enhancement consistent with osteomyelitis similar in appearance to the prior exam. There is mild diffuse edema throughout the pelvic musculature with mild enhancement with interval development of small nonenhancing fluid collections within the right adductor magnus and adductor brevis muscles extending from the right ischial tuberosity most consistent with intramuscular abscesses. 2. Small right joint effusion with mild synovial enhancement. Septic arthritis cannot be excluded. If there is clinical concern recommend arthrocentesis. 3. Decubitus ulcer overlying the left ischial tuberosity without underlying osseous abnormality. 4. There is   diffuse mild edema of the pelvic musculature which is likely neurogenic. There is a concern of an element of infectious myositis especially in the right adductor and gluteal compartment.   Electronically Signed   By: Hetal  Patel   On: 11/10/2014 17:12    Review of Systems  Constitutional: Negative.   HENT: Negative.   Eyes: Negative.     Respiratory: Negative.   Cardiovascular: Negative.   Gastrointestinal: Negative.   Genitourinary: Negative.   Skin: Negative.   Psychiatric/Behavioral: Negative.    Blood pressure 96/65, pulse 81, temperature 98.5 F (36.9 C), temperature source Oral, resp. rate 16, height 5' 8" (1.727 m), weight 70.4 kg (155 lb 3.3 oz), SpO2 100 %. Physical Exam  Constitutional: He is oriented to person, place, and time. He appears well-developed.  HENT:  Head: Normocephalic and atraumatic.  Eyes: Conjunctivae and EOM are normal. Pupils are equal, round, and reactive to light.  Respiratory: Effort normal.    GI: Soft.  Musculoskeletal:       Back:  Neurological: He is alert and oriented to person, place, and time.  Psychiatric: He has a normal mood and affect. His behavior is normal. Judgment and thought content normal.    Assessment/Plan: Recommend debridement with Acell and VAC placement.  Off loading is very important.  Continue with the air mattress bed, maximize protein intact and continue multivitamins daily, Vit C 500 mg twice daily and zinc 220 mg daily.  Will add to OR schedule.  SANGER,CLAIRE 11/11/2014, 2:52 PM      

## 2014-11-12 NOTE — Progress Notes (Signed)
Physical Therapy Treatment Patient Details Name: Jason [name] MRN: [id number] DOB: 1986-08-23 Today's Date: 11/12/2014    History of Present Illness 29 y.o. male admitted from home to [facility] on 11/08/14 for decubitus ulcers infection management and osteomyelitis.  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 Pt underwent surgical cleaning and debridement of his sacral and ischial wounds on 11/12/14 with palcement of Acell and biopsy of coccx bone.  Wound vac placed as well.     PT Comments    Corene Cornea from [facility] present today to re-eval for modifications to pt's home WC.  Pt was lethargic (per mom suspected due to meds), but still participatory during our session to the best of his abilities.  He is scheduled to go to surgery for wound management later this AM.  PT will continue to follow acutely and continue to endorse that pt discharge to CIR for rehab before returning home.    Follow Up Recommendations  CIR     Equipment Recommendations  Other (comment) (working with Birdie Riddle for modifications to [facility] at home)    Recommendations for Other Services Rehab consult     Precautions / Restrictions Precautions Precautions: Fall;Other (comment) (multiple wounds) Precaution Comments: Per mom, wound care MD PTA did not want him up for longer than an hour at a time due to his wounds.     Mobility  Bed Mobility Overal bed mobility: +2 for physical assistance;Needs Assistance Bed Mobility: Supine to Sit;Sit to Sidelying;Rolling Rolling: Max assist Sidelying to sit: +2 for physical assistance;Max assist     Sit to sidelying: +2 for physical assistance;Max assist General bed mobility comments: Two person assist for supine to sit and sit to side lying transfers to help manage bil legs and assist  with movement of trunk.  Pt, due to being lethargic is less helpful with bil upper extremities today than in previous session with this therapist, but he continues to be willing to participate to his best ability.          Balance Overall balance assessment: Needs assistance Sitting-balance support: Feet supported;Bilateral upper extremity supported Sitting balance-Leahy Scale: Zero Sitting balance - Comments: Pt sat EOB with two person max assist working on sitting balance, manual placement of hands to try to support his trunk more, and hook pulling with alternating elbows to pull to more upright sitting posture and then push back using elbow extesors to his support surface.  We also worked on him lifting his head and sitting up tall (posture and postural muscle training).  We did leave the air mattress inflated due to wanting to decrease the shearing on his ischial and sacral wounds during transitions, however, this likely made sitting balance harder as he was on a more compliant surface.                             Cognition Arousal/Alertness: Lethargic Behavior During Therapy: WFL for tasks assessed/performed Overall Cognitive Status: Within Functional Limits for tasks assessed                          General Comments General comments (skin integrity, edema, etc.): Jason from [facility] here for [facility] re-eval with PT. Discussed with PT and mom different chair options and took measurements for new chair componenets. Corene Cornea and PT  to work together to draft a Leisure centre manager and LMN for new [employer] for [employer].       Pertinent Vitals/Pain Pain Assessment: Faces Faces Pain Scale: Hurts even more Pain Location: buttocks Pain Descriptors / Indicators: Burning Pain Intervention(s): Limited activity within patient's tolerance;Monitored during session;Repositioned           PT Goals (current goals can now be found in the care plan section) Acute Rehab PT Goals Patient  Stated Goal: to decrease pain, go home Progress towards PT goals: Not progressing toward goals - comment (limited by lethargy today)    Frequency  Min 3X/week    PT Plan Current plan remains appropriate       End of Session   Activity Tolerance: Patient limited by pain;Patient limited by lethargy Patient left: in bed;with call bell/phone within reach;with family/visitor present     Time: 1005-1044 PT Time Calculation (min) (ACUTE ONLY): 39 min  Charges:  $Therapeutic Activity: 23-37 mins $Self Care/Home Management: 8-22            Neale Marzette B. [place], [place], DPT [phone]   11/12/2014, 7:24 PM

## 2014-11-13 DIAGNOSIS — R7881 Bacteremia: Secondary | ICD-10-CM

## 2014-11-13 DIAGNOSIS — A4150 Gram-negative sepsis, unspecified: Secondary | ICD-10-CM

## 2014-11-13 LAB — CBC
HCT: 22 % — ABNORMAL LOW (ref 39.0–52.0)
HEMATOCRIT: 25.2 % — AB (ref 39.0–52.0)
HEMOGLOBIN: 6.9 g/dL — AB (ref 13.0–17.0)
Hemoglobin: 8.1 g/dL — ABNORMAL LOW (ref 13.0–17.0)
MCH: 25.9 pg — ABNORMAL LOW (ref 26.0–34.0)
MCH: 26.4 pg (ref 26.0–34.0)
MCHC: 31.4 g/dL (ref 30.0–36.0)
MCHC: 32.1 g/dL (ref 30.0–36.0)
MCV: 82.7 fL (ref 78.0–100.0)
MCV: 82.1 fL (ref 78.0–100.0)
PLATELETS: 582 10*3/uL — AB (ref 150–400)
Platelets: 600 10*3/uL — ABNORMAL HIGH (ref 150–400)
RBC: 2.66 MIL/uL — ABNORMAL LOW (ref 4.22–5.81)
RBC: 3.07 MIL/uL — ABNORMAL LOW (ref 4.22–5.81)
RDW: 16.5 % — ABNORMAL HIGH (ref 11.5–15.5)
RDW: 15.9 % — AB (ref 11.5–15.5)
WBC: 21.8 10*3/uL — AB (ref 4.0–10.5)
WBC: 22.4 10*3/uL — ABNORMAL HIGH (ref 4.0–10.5)

## 2014-11-13 LAB — GLUCOSE, CAPILLARY
GLUCOSE-CAPILLARY: 213 mg/dL — AB (ref 70–99)
GLUCOSE-CAPILLARY: 164 mg/dL — AB (ref 70–99)
Glucose-Capillary: 211 mg/dL — ABNORMAL HIGH (ref 70–99)
Glucose-Capillary: 299 mg/dL — ABNORMAL HIGH (ref 70–99)

## 2014-11-13 LAB — BASIC METABOLIC PANEL
Anion gap: 4 — ABNORMAL LOW (ref 5–15)
BUN: 12 mg/dL (ref 6–23)
CHLORIDE: 109 mmol/L (ref 96–112)
CO2: 22 mmol/L (ref 19–32)
Calcium: 10 mg/dL (ref 8.4–10.5)
Creatinine, Ser: 0.81 mg/dL (ref 0.50–1.35)
GFR calc Af Amer: >90 mL/min (ref >90)
GLUCOSE: 209 mg/dL — AB (ref 70–99)
Potassium: 4.9 mmol/L (ref 3.5–5.1)
Sodium: 135 mmol/L (ref 135–145)

## 2014-11-13 LAB — LACTIC ACID, PLASMA: Lactic Acid, Venous: 1.2 mmol/L (ref 0.5–2.0)

## 2014-11-13 LAB — PREPARE RBC (CROSSMATCH)

## 2014-11-13 MED ORDER — INSULIN GLARGINE 100 UNIT/ML ~~LOC~~ SOLN
25.0000 [IU] | Freq: Every day | SUBCUTANEOUS | Status: DC
  Administered 2014-11-13 – 2014-11-17 (×5): 25 [IU] via SUBCUTANEOUS
  Filled 2014-11-13 (×6): qty 0.25

## 2014-11-13 MED ORDER — SODIUM CHLORIDE 0.9 % IV SOLN
Freq: Once | INTRAVENOUS | Status: AC
  Administered 2014-11-18: 14:00:00 via INTRAVENOUS

## 2014-11-13 MED ORDER — OXYCODONE HCL 5 MG PO TABS
5.0000 mg | ORAL_TABLET | Freq: Once | ORAL | Status: AC
  Administered 2014-11-14: 5 mg via ORAL

## 2014-11-13 NOTE — Care Management Note (Unsigned)
    Page 1 of 1   11/13/2014     2:18:22 PM CARE MANAGEMENT NOTE 11/13/2014  Patient:  Jason [name], Jason [name]   Account Number:  [id number]  Date Initiated:  11/13/2014  Documentation initiated by:  GRAVES-BIGELOW,Rector [place]ons[place]  Subjective/Objective Assessment:   Pt admitted for sepsis 2/2 likely decubitus ulcer infection, white count was elevated from a baseline of around 16,000-24, he had some foul-smelling discharge from the decubitus ulcer, he also had her new blister on his upper back.     Action/Plan:   Pt is active with [facility] for [facility] services. Order will be needed for resumption. CM will continue to monitor.   Anticipated DC Date:  11/15/2014   Anticipated DC Plan:  [place]  CM consult      Choice offered to / List presented to:             Status of service:  In process, will continue to follow Medicare Important Message given?  NO (If response is "NO", the following Medicare IM given date fields will be blank) Date Medicare IM given:   Medicare IM given by:   Date Additional Medicare IM given:   Additional Medicare IM given by:    Discharge Disposition:    Per UR Regulation:  Reviewed for med. necessity/level of care/duration of stay  If discussed at [place] of Stay Meetings, dates discussed:    Comments:

## 2014-11-13 NOTE — Progress Notes (Signed)
PROGRESS NOTE  Jason [name]:[id number] DOB: [birth year] DOA: 11/08/2014 PCP: [name], MD  Assessment/Plan: Sepsis  -secondary to sacral wound infection/pelvic osteomyelitis/intramuscular abscesses -appreciate ID consult -abx per ID -IVF -stool for Cdiff--neg -lactic acid--1.2  Bacteremia -GNR from 2 of 2 sets on 11/08/14 -continue merrem pending culture -May need to remove PICC line Delirium -secondary to opioids -More alert since discontinuation of intravenous opioids--> Back to baseline -pt had this issue on previous admissions -fine balance between pain control and delirium -discussed with mother at bedside-->d/c Dilaudid, restart home percocet Pelvic osteomyelitis/intramuscular abscess/septic arthritis -Failed IV Vanco for the last 3 weeks via PICC -Continue Zyvox and meropenem -appreciate plastic surgery--appreciate Dr. Vevelyn Francois 11/12/14 with VAC -MRI pelvis--right ischial tuberosity with marrow edema, intramuscular abscesses, small right joint effusion with enhancement -appreciate Dr. Dennison Mascot noted for aspiration under fluoro -please send aspirate for cell count and differential---if any fluid left over send for culture Burkholderia Bacteruria -unusual organism in urine -appreciate Dr. Tresa Moore-- suprapubic cath changed 11/12/14 AKI -secondary to sepsis -improved Protein calorie malnutrition -Continue protein supplement Diabetes mellitus type 1 -Continue Lantus and NovoLog sliding scale - hemoglobin A1c--8.8 -11/12/14--pt had hypoglycemia today due to being npo for surgery-->decrease lantus to 20 units -11/13/14--increase Lantus to 25 units at bedtime as the patient's oral intake is improved Spinal cord infarction history  -Patient has suprapubic catheter placed by Dr. McDiarmid 3 weeks ago Tobacco abuse -counseled to quit Iron deficiency anemia  -Iron saturation 6%  -Start ferrous sulfate    Family Communication: Mother updated  at beside Disposition Plan: Home when medically stable         Procedures/Studies: Mr Pelvis W Wo Contrast  11/10/2014   CLINICAL DATA:  Quadriplegia. Sacral decubitus ulcer. Sepsis. Foul-smelling discharge from the decubitus ulcer.  EXAM: MRI PELVIS WITHOUT AND WITH CONTRAST  TECHNIQUE: Multiplanar multisequence MR imaging of the pelvis was performed both before and after administration of intravenous contrast.  CONTRAST:  63mL MULTIHANCE GADOBENATE DIMEGLUMINE 529 MG/ML IV SOLN  COMPARISON:  10/02/2014  FINDINGS: Again noted are decubitus ulcers over the ischial tuberosities, right larger than left. The right decubitus ulcer extends down to the ischial tuberosity with underlying marrow edema and cortical irregularity consistent with osteomyelitis. There is mild diffuse edema of the pelvic musculature with mild enhancement. There are interval development of multiple small nonenhancing fluid collections within the right adductor magnus and adductor brevis muscles extending from the right ischial tuberosity most consistent with intramuscular abscesses. The conglomeration of the area measures approximately 4.8 x 2 cm, but each individual collection measures significantly smaller than this. There is a rind of nonenhancement around the wound consistent with necrosis.  There is no marrow signal abnormality involving the left ischial tuberosity.  Bilateral hips demonstrate no fracture, dislocation or avascular necrosis. There is a small right hip joint effusion with mild synovial enhancement. There is no bursa formation. There is generalized edema within the subcutaneous fat throughout the pelvis.  IMPRESSION: 1. Again noted is a decubitus ulcer over the ischial tuberosities bilaterally, right larger than left. The right decubitus ulcer extends down to the ischial tuberosity with underlying marrow edema, cortical irregularity and enhancement consistent with osteomyelitis similar in appearance to the prior  exam. There is mild diffuse edema throughout the pelvic musculature with mild enhancement with interval development of small nonenhancing fluid collections within the right adductor magnus and adductor brevis muscles extending from the right ischial tuberosity most consistent with intramuscular  abscesses. 2. Small right joint effusion with mild synovial enhancement. Septic arthritis cannot be excluded. If there is clinical concern recommend arthrocentesis. 3. Decubitus ulcer overlying the left ischial tuberosity without underlying osseous abnormality. 4. There is diffuse mild edema of the pelvic musculature which is likely neurogenic. There is a concern of an element of infectious myositis especially in the right adductor and gluteal compartment.   Electronically Signed   By: Kathreen Devoid   On: 11/10/2014 17:12   Dg Chest Port 1 View  11/08/2014   CLINICAL DATA:  Cough, elevated white count  EXAM: PORTABLE CHEST - 1 VIEW  COMPARISON:  10/10/2014  FINDINGS: Consolidation in the left lower lobe, similar to prior study. Increasing right medial basilar airspace opacity also noted. Findings compatible with pneumonia. No effusions. Right PICC are remains in place, unchanged.  IMPRESSION: Stable left lower lobe pneumonia. Increasing right medial basilar density, also likely pneumonia.   Electronically Signed   By: Rolm Baptise M.D.   On: 11/08/2014 18:48         Subjective: Patient denies fevers, chills, headache, chest pain, dyspnea, nausea, vomiting, diarrhea, abdominal pain, dysuria, hematuria   Objective: Filed Vitals:   11/13/14 0643 11/13/14 1145 11/13/14 1222 11/13/14 1500  BP:  107/72 125/77 125/86  Pulse:  114 103 104  Temp:  100.1 F (37.8 C) 99.6 F (37.6 C) 100.4 F (38 C)  TempSrc:  Oral Oral Oral  Resp:  16 16 18   Height:      Weight: 75.3 kg (166 lb 0.1 oz)     SpO2:        Intake/Output Summary (Last 24 hours) at 11/13/14 1817 Last data filed at 11/13/14 1658  Gross per 24  hour  Intake    446 ml  Output   1800 ml  Net  -1354 ml   Weight change:  Exam:   General:  Pt is alert, follows commands appropriately, not in acute distress  HEENT: No icterus, No thrush,  [place]/AT  Cardiovascular: RRR, S1/S2, no rubs, no gallops  Respiratory: CTA bilaterally, no wheezing, no crackles, no rhonchi  Abdomen: Soft/+BS, non tender, non distended, no guarding  Extremities: No edema, No lymphangitis, No petechiae, No rashes, no synovitis; right foot without any draining wounds, lymphangitis, gangrene  Data Reviewed: Basic Metabolic Panel:  Recent Labs Lab 11/09/14 0425 11/10/14 0425 11/11/14 0500 11/12/14 0455 11/12/14 1408 11/13/14 0507  NA 136 136 137 138 141 135  K 3.9 3.9 4.1 4.6 4.8 4.9  CL 111 112 113* 111  --  109  CO2 20 22 21 23   --  22  GLUCOSE 164* 127* 127* 160* 56* 209*  BUN 18 12 12 15   --  12  CREATININE 1.36* 1.30 1.06 1.15  --  0.81  CALCIUM 10.2 10.1 10.7* 10.3  --  10.0   Liver Function Tests:  Recent Labs Lab 11/08/14 1534  AST 10  ALT 10  ALKPHOS 144*  BILITOT 0.6  PROT 6.8  ALBUMIN 1.9*   No results for input(s): LIPASE, AMYLASE in the last 168 hours. No results for input(s): AMMONIA in the last 168 hours. CBC:  Recent Labs Lab 11/08/14 1534  11/09/14 0425 11/10/14 0425 11/11/14 0500 11/12/14 0455 11/12/14 1408 11/13/14 0507  WBC 24.5*  --  24.1* 21.8* 19.5* 17.5*  --  21.8*  NEUTROABS 19.4*  --   --   --   --   --   --   --   HGB 7.9*  < >  7.3* 7.0* 7.1* 7.0* 7.8* 6.9*  HCT 24.7*  < > 23.0* 21.4* 22.3* 22.1* 23.0* 22.0*  MCV 80.2  --  82.4 82.0 80.8 81.3  --  82.7  PLT 508*  --  526* 542* 590* 565*  --  600*  < > = values in this interval not displayed. Cardiac Enzymes: No results for input(s): CKTOTAL, CKMB, CKMBINDEX, TROPONINI in the last 168 hours. BNP: Invalid input(s): POCBNP CBG:  Recent Labs Lab 11/12/14 1729 11/12/14 2058 11/13/14 0755 11/13/14 1202 11/13/14 1651  GLUCAP 97 78 211* 213*  299*    Recent Results (from the past 240 hour(s))  Urine culture     Status: None   Collection Time: 11/08/14  3:13 PM  Result Value Ref Range Status   Specimen Description URINE, CATHETERIZED  Final   Special Requests NONE  Final   Colony Count   Final    >=100,000 COLONIES/ML Performed at [place]   Final    BURKHOLDERIA CEPACIA Performed at [facility]    Report Status 11/11/2014 FINAL  Final   Organism ID, Bacteria BURKHOLDERIA CEPACIA  Final      Susceptibility   Burkholderia cepacia - MIC*    LEVOFLOXACIN 4 INTERMEDIATE Intermediate     TRIMETH/SULFA 160 RESISTANT Resistant     CEFTAZIDIME 8 SENSITIVE Sensitive     * BURKHOLDERIA CEPACIA  Culture, blood (routine x 2)     Status: None (Preliminary result)   Collection Time: 11/08/14  5:25 PM  Result Value Ref Range Status   Specimen Description BLOOD ARM LEFT  Final   Special Requests BOTTLES DRAWN AEROBIC AND ANAEROBIC 10CC  Final   Culture   Final    GRAM NEGATIVE RODS Note: Culture results may be compromised due to an excessive volume of blood received in culture bottles. Gram Stain Report Called to,Read Back By and Verified With: Juanetta Beets 11/12/14 0144A [place] Performed at [facility]    Report Status PENDING  Incomplete  Culture, blood (routine x 2)     Status: None (Preliminary result)   Collection Time: 11/08/14  5:35 PM  Result Value Ref Range Status   Specimen Description BLOOD ARM LEFT  Final   Special Requests BOTTLES DRAWN AEROBIC AND ANAEROBIC 10CC  Final   Culture   Final    GRAM NEGATIVE RODS Note: Gram Stain Report Called to,Read Back By and Verified With: Iran Sizer RN @ [facility] 11/12/14 Performed at [facility]    Report Status PENDING  Incomplete  Clostridium Difficile by PCR     Status: None   Collection Time: 11/12/14  8:41 AM  Result Value Ref Range Status   C difficile by pcr NEGATIVE NEGATIVE Final     Scheduled Meds: . sodium  chloride   Intravenous Once  . atorvastatin  20 mg Oral q1800  . baclofen  10 mg Oral BID WC  . baclofen  20 mg Oral QHS  . feeding supplement (GLUCERNA SHAKE)  237 mL Oral TID BM  . feeding supplement (PRO-STAT SUGAR FREE 64)  30 mL Oral Daily  . fentaNYL  25 mcg Transdermal Q72H  . heparin  5,000 Units Subcutaneous 3 times per day  . insulin aspart  0-5 Units Subcutaneous QHS  . insulin aspart  0-9 Units Subcutaneous TID WC  . insulin aspart  4 Units Subcutaneous TID AC  . insulin glargine  25 Units Subcutaneous QHS  . linezolid  600 mg Oral  Q12H  . loratadine  10 mg Oral Daily  . meropenem (MERREM) IV  1 g Intravenous Q8H  . pantoprazole  40 mg Oral Daily  . potassium chloride SA  20 mEq Oral Daily  . pregabalin  100 mg Oral TID AC  . pregabalin  200 mg Oral QHS   Continuous Infusions:    Alexxander Kurt, DO  [facility] Pager [phone]  If 7PM-7AM, please contact night-coverage [email or web address] Password [email or web address] 11/13/2014, 6:17 PM   LOS: 5 days

## 2014-11-13 NOTE — Progress Notes (Signed)
CRITICAL VALUE ALERT  Critical value received:  Hgb 6.9  Date of notification:  02/06  Time of notification:  5750  Critical value read back:Yes.    Nurse who received alert:  Dow Adolph   MD notified (1st page):  Kathline Magic, NP  Time of first page:  0603  MD notified (2nd page):  Time of second page:  Responding MD:  Kathline Magic, NP  Time MD responded:  [phone]

## 2014-11-13 NOTE — Progress Notes (Signed)
   11/13/14 1145  Vitals  Temp 100.1 F (37.8 C)  Temp Source Oral  Pulse Rate (!) 114  Resp 16  BP 107/72 mmHg  Doc notified of temp order to give blood anyway.  Will monitor during administration.

## 2014-11-14 LAB — BASIC METABOLIC PANEL
Anion gap: 2 — ABNORMAL LOW (ref 5–15)
BUN: 11 mg/dL (ref 6–23)
CHLORIDE: 109 mmol/L (ref 96–112)
CO2: 24 mmol/L (ref 19–32)
Calcium: 9.6 mg/dL (ref 8.4–10.5)
Creatinine, Ser: 0.74 mg/dL (ref 0.50–1.35)
GFR calc Af Amer: >90 mL/min (ref >90)
GFR calc non Af Amer: >90 mL/min (ref >90)
Glucose, Bld: 145 mg/dL — ABNORMAL HIGH (ref 70–99)
Potassium: 4.9 mmol/L (ref 3.5–5.1)
Sodium: 135 mmol/L (ref 135–145)

## 2014-11-14 LAB — TYPE AND SCREEN
ABO/RH(D): O POS
Antibody Screen: NEGATIVE
UNIT DIVISION: 0

## 2014-11-14 LAB — CBC
HCT: 24.8 % — ABNORMAL LOW (ref 39.0–52.0)
Hemoglobin: 8.1 g/dL — ABNORMAL LOW (ref 13.0–17.0)
MCH: 26.8 pg (ref 26.0–34.0)
MCHC: 32.7 g/dL (ref 30.0–36.0)
MCV: 82.1 fL (ref 78.0–100.0)
Platelets: 584 10*3/uL — ABNORMAL HIGH (ref 150–400)
RBC: 3.02 MIL/uL — ABNORMAL LOW (ref 4.22–5.81)
RDW: 15.9 % — ABNORMAL HIGH (ref 11.5–15.5)
WBC: 23.1 10*3/uL — ABNORMAL HIGH (ref 4.0–10.5)

## 2014-11-14 LAB — CULTURE, BLOOD (ROUTINE X 2)

## 2014-11-14 LAB — GLUCOSE, CAPILLARY
GLUCOSE-CAPILLARY: 109 mg/dL — AB (ref 70–99)
GLUCOSE-CAPILLARY: 86 mg/dL (ref 70–99)
GLUCOSE-CAPILLARY: 193 mg/dL — AB (ref 70–99)
Glucose-Capillary: 82 mg/dL (ref 70–99)

## 2014-11-14 LAB — TROPONIN I: Troponin I: 0.04 ng/mL — ABNORMAL HIGH (ref <0.031)

## 2014-11-14 NOTE — Progress Notes (Signed)
PROGRESS NOTE  Jason [name] KGY:[id number] DOB: 1986-08-02 DOA: 11/08/2014 PCP: [name], MD  Assessment/Plan: Sepsis  -secondary to sacral wound infection/pelvic osteomyelitis/intramuscular abscesses -appreciate ID consult -abx per ID -IVF -stool for Cdiff--neg -lactic acid--1.2  -WBC up in part due to PRBC transfusion on 11/13/14 Bacteremia--Klebsiella PNA -GNR from 2 of 2 sets on 11/08/14 -antibiotic changes per ID -May need to remove PICC line--surveillance blood cultures remain neg Delirium -secondary to opioids -More alert since discontinuation of intravenous opioids--> Back to baseline -pt had this issue on previous admissions -fine balance between pain control and delirium -discussed with mother at bedside-->d/c Dilaudid, restart home percocet Pelvic osteomyelitis/intramuscular abscess/septic arthritis -Failed IV Vanco for the last 3 weeks via PICC -Continue Zyvox and meropenem -abx changes per ID -appreciate plastic surgery--appreciate Dr. Vevelyn Francois 11/12/14 with VAC -MRI pelvis--right ischial tuberosity with marrow edema, intramuscular abscesses, small right joint effusion with enhancement -appreciate Dr. Dennison Mascot noted for aspiration under fluoro -please send aspirate for cell count and differential---if any fluid left over send for culture Burkholderia Bacteruria -unusual organism in urine -appreciate Dr. Tresa Moore-- suprapubic cath changed 11/12/14 Atypical Chest pain -cycle troponins -EKG--no concerning ischemic changes AKI -secondary to sepsis and volume depletion; early repolarization -improved Protein calorie malnutrition -Continue protein supplement Diabetes mellitus type 1 -Continue Lantus and NovoLog sliding scale - hemoglobin A1c--8.8 -11/12/14--pt had hypoglycemia today due to being npo for surgery-->decrease lantus to 20 units -11/13/14--increase Lantus to 25 units at bedtime as the patient's oral intake is improved Spinal  cord infarction history  -Patient has suprapubic catheter placed by Dr. McDiarmid 3 weeks ago Tobacco abuse -counseled to quit Iron deficiency anemia  -Iron saturation 6%  -Start ferrous sulfate    Family Communication: Mother updated at beside Disposition Plan: Home when medically stable         Procedures/Studies: Mr Pelvis W Wo Contrast  11/10/2014   CLINICAL DATA:  Quadriplegia. Sacral decubitus ulcer. Sepsis. Foul-smelling discharge from the decubitus ulcer.  EXAM: MRI PELVIS WITHOUT AND WITH CONTRAST  TECHNIQUE: Multiplanar multisequence MR imaging of the pelvis was performed both before and after administration of intravenous contrast.  CONTRAST:  72mL MULTIHANCE GADOBENATE DIMEGLUMINE 529 MG/ML IV SOLN  COMPARISON:  10/02/2014  FINDINGS: Again noted are decubitus ulcers over the ischial tuberosities, right larger than left. The right decubitus ulcer extends down to the ischial tuberosity with underlying marrow edema and cortical irregularity consistent with osteomyelitis. There is mild diffuse edema of the pelvic musculature with mild enhancement. There are interval development of multiple small nonenhancing fluid collections within the right adductor magnus and adductor brevis muscles extending from the right ischial tuberosity most consistent with intramuscular abscesses. The conglomeration of the area measures approximately 4.8 x 2 cm, but each individual collection measures significantly smaller than this. There is a rind of nonenhancement around the wound consistent with necrosis.  There is no marrow signal abnormality involving the left ischial tuberosity.  Bilateral hips demonstrate no fracture, dislocation or avascular necrosis. There is a small right hip joint effusion with mild synovial enhancement. There is no bursa formation. There is generalized edema within the subcutaneous fat throughout the pelvis.  IMPRESSION: 1. Again noted is a decubitus ulcer over the ischial  tuberosities bilaterally, right larger than left. The right decubitus ulcer extends down to the ischial tuberosity with underlying marrow edema, cortical irregularity and enhancement consistent with osteomyelitis similar in appearance to the prior exam. There is mild diffuse edema throughout  the pelvic musculature with mild enhancement with interval development of small nonenhancing fluid collections within the right adductor magnus and adductor brevis muscles extending from the right ischial tuberosity most consistent with intramuscular abscesses. 2. Small right joint effusion with mild synovial enhancement. Septic arthritis cannot be excluded. If there is clinical concern recommend arthrocentesis. 3. Decubitus ulcer overlying the left ischial tuberosity without underlying osseous abnormality. 4. There is diffuse mild edema of the pelvic musculature which is likely neurogenic. There is a concern of an element of infectious myositis especially in the right adductor and gluteal compartment.   Electronically Signed   By: Kathreen Devoid   On: 11/10/2014 17:12   Dg Chest Port 1 View  11/08/2014   CLINICAL DATA:  Cough, elevated white count  EXAM: PORTABLE CHEST - 1 VIEW  COMPARISON:  10/10/2014  FINDINGS: Consolidation in the left lower lobe, similar to prior study. Increasing right medial basilar airspace opacity also noted. Findings compatible with pneumonia. No effusions. Right PICC are remains in place, unchanged.  IMPRESSION: Stable left lower lobe pneumonia. Increasing right medial basilar density, also likely pneumonia.   Electronically Signed   By: Rolm Baptise M.D.   On: 11/08/2014 18:48         Subjective: Patient denies fevers, chills, headache, chest pain, dyspnea, nausea, vomiting, diarrhea, abdominal pain, dysuria, hematuria   Objective: Filed Vitals:   11/13/14 2121 11/14/14 0737 11/14/14 1338 11/14/14 1423  BP: 121/73 106/67 111/73 109/65  Pulse: 93 88 88 88  Temp: 99.1 F (37.3 C) 99.7  F (37.6 C) 98.4 F (36.9 C) 98.7 F (37.1 C)  TempSrc: Oral Oral Oral Axillary  Resp: 20 13 16 18   Height:      Weight:  71.7 kg (158 lb 1.1 oz)    SpO2: 100% 100% 100% 100%    Intake/Output Summary (Last 24 hours) at 11/14/14 1811 Last data filed at 11/14/14 1515  Gross per 24 hour  Intake    300 ml  Output   4650 ml  Net  -4350 ml   Weight change:  Exam:   General:  Pt is alert, follows commands appropriately, not in acute distress  HEENT: No icterus, No thrush,  [place]/AT  Cardiovascular: RRR, S1/S2, no rubs, no gallops  Respiratory: CTA bilaterally, no wheezing, no crackles, no rhonchi  Abdomen: Soft/+BS, non tender, non distended, no guarding  Extremities: No edema, No lymphangitis, No petechiae, No rashes, no synovitis  Data Reviewed: Basic Metabolic Panel:  Recent Labs Lab 11/10/14 0425 11/11/14 0500 11/12/14 0455 11/12/14 1408 11/13/14 0507 11/14/14 0500  NA 136 137 138 141 135 135  K 3.9 4.1 4.6 4.8 4.9 4.9  CL 112 113* 111  --  109 109  CO2 22 21 23   --  22 24  GLUCOSE 127* 127* 160* 56* 209* 145*  BUN 12 12 15   --  12 11  CREATININE 1.30 1.06 1.15  --  0.81 0.74  CALCIUM 10.1 10.7* 10.3  --  10.0 9.6   Liver Function Tests:  Recent Labs Lab 11/08/14 1534  AST 10  ALT 10  ALKPHOS 144*  BILITOT 0.6  PROT 6.8  ALBUMIN 1.9*   No results for input(s): LIPASE, AMYLASE in the last 168 hours. No results for input(s): AMMONIA in the last 168 hours. CBC:  Recent Labs Lab 11/08/14 1534  11/11/14 0500 11/12/14 0455 11/12/14 1408 11/13/14 0507 11/13/14 1930 11/14/14 0500  WBC 24.5*  < > 19.5* 17.5*  --  21.8* 22.4*  23.1*  NEUTROABS 19.4*  --   --   --   --   --   --   --   HGB 7.9*  < > 7.1* 7.0* 7.8* 6.9* 8.1* 8.1*  HCT 24.7*  < > 22.3* 22.1* 23.0* 22.0* 25.2* 24.8*  MCV 80.2  < > 80.8 81.3  --  82.7 82.1 82.1  PLT 508*  < > 590* 565*  --  600* 582* 584*  < > = values in this interval not displayed. Cardiac Enzymes:  Recent Labs Lab  11/14/14 1351  TROPONINI <0.03   BNP: Invalid input(s): POCBNP CBG:  Recent Labs Lab 11/13/14 1651 11/13/14 2151 11/14/14 0811 11/14/14 1204 11/14/14 1651  GLUCAP 299* 164* 109* 82 86    Recent Results (from the past 240 hour(s))  Urine culture     Status: None   Collection Time: 11/08/14  3:13 PM  Result Value Ref Range Status   Specimen Description URINE, CATHETERIZED  Final   Special Requests NONE  Final   Colony Count   Final    >=100,000 COLONIES/ML Performed at [place] Performed at [facility]    Report Status 11/11/2014 FINAL  Final   Organism ID, Bacteria BURKHOLDERIA CEPACIA  Final      Susceptibility   Burkholderia cepacia - MIC*    LEVOFLOXACIN 4 INTERMEDIATE Intermediate     TRIMETH/SULFA 160 RESISTANT Resistant     CEFTAZIDIME 8 SENSITIVE Sensitive     * BURKHOLDERIA CEPACIA  Culture, blood (routine x 2)     Status: None   Collection Time: 11/08/14  5:25 PM  Result Value Ref Range Status   Specimen Description BLOOD ARM LEFT  Final   Special Requests BOTTLES DRAWN AEROBIC AND ANAEROBIC 10CC  Final   Culture   Final    KLEBSIELLA PNEUMONIAE Note: Culture results may be compromised due to an excessive volume of blood received in culture bottles. Gram Stain Report Called to,Read Back By and Verified With: Juanetta Beets 11/12/14 0144A [place] Performed at [facility]    Report Status 11/14/2014 FINAL  Final   Organism ID, Bacteria KLEBSIELLA PNEUMONIAE  Final      Susceptibility   Klebsiella pneumoniae - MIC*    AMPICILLIN RESISTANT      AMPICILLIN/SULBACTAM 4 SENSITIVE Sensitive     CEFAZOLIN <=4 SENSITIVE Sensitive     CEFEPIME <=1 SENSITIVE Sensitive     CEFTAZIDIME <=1 SENSITIVE Sensitive     CEFTRIAXONE <=1 SENSITIVE Sensitive     CIPROFLOXACIN <=0.25 SENSITIVE Sensitive     GENTAMICIN <=1 SENSITIVE Sensitive     IMIPENEM <=0.25 SENSITIVE Sensitive     PIP/TAZO <=4  SENSITIVE Sensitive     TOBRAMYCIN <=1 SENSITIVE Sensitive     TRIMETH/SULFA <=20 SENSITIVE Sensitive     * KLEBSIELLA PNEUMONIAE  Culture, blood (routine x 2)     Status: None   Collection Time: 11/08/14  5:35 PM  Result Value Ref Range Status   Specimen Description BLOOD ARM LEFT  Final   Special Requests BOTTLES DRAWN AEROBIC AND ANAEROBIC 10CC  Final   Culture   Final    KLEBSIELLA PNEUMONIAE Note: SUSCEPTIBILITIES PERFORMED ON PREVIOUS CULTURE WITHIN THE LAST 5 DAYS. Gram Stain Report Called to,Read Back By and Verified With: Iran Sizer RN @ [facility] 11/12/14 Performed at [facility]    Report Status 11/14/2014 FINAL  Final  Clostridium Difficile by  PCR     Status: None   Collection Time: 11/12/14  8:41 AM  Result Value Ref Range Status   C difficile by pcr NEGATIVE NEGATIVE Final  Culture, blood (routine x 2)     Status: None (Preliminary result)   Collection Time: 11/12/14  8:04 PM  Result Value Ref Range Status   Specimen Description BLOOD LEFT ARM  Final   Special Requests BOTTLES DRAWN AEROBIC AND ANAEROBIC 10CC  Final   Culture   Final           BLOOD CULTURE RECEIVED NO GROWTH TO DATE CULTURE WILL BE HELD FOR 5 DAYS BEFORE ISSUING A FINAL NEGATIVE REPORT Note: Culture results may be compromised due to an excessive volume of blood received in culture bottles. Performed at [facility]    Report Status PENDING  Incomplete  Culture, blood (routine x 2)     Status: None (Preliminary result)   Collection Time: 11/12/14  8:19 PM  Result Value Ref Range Status   Specimen Description BLOOD LEFT ARM  Final   Special Requests   Final    BOTTLES DRAWN AEROBIC AND ANAEROBIC BLUE 10CC RED 5CC   Culture   Final           BLOOD CULTURE RECEIVED NO GROWTH TO DATE CULTURE WILL BE HELD FOR 5 DAYS BEFORE ISSUING A FINAL NEGATIVE REPORT Performed at [facility]    Report Status PENDING  Incomplete     Scheduled Meds: . sodium chloride   Intravenous Once    . atorvastatin  20 mg Oral q1800  . baclofen  10 mg Oral BID WC  . baclofen  20 mg Oral QHS  . feeding supplement (GLUCERNA SHAKE)  237 mL Oral TID BM  . feeding supplement (PRO-STAT SUGAR FREE 64)  30 mL Oral Daily  . fentaNYL  25 mcg Transdermal Q72H  . heparin  5,000 Units Subcutaneous 3 times per day  . insulin aspart  0-5 Units Subcutaneous QHS  . insulin aspart  0-9 Units Subcutaneous TID WC  . insulin aspart  4 Units Subcutaneous TID AC  . insulin glargine  25 Units Subcutaneous QHS  . linezolid  600 mg Oral Q12H  . loratadine  10 mg Oral Daily  . meropenem (MERREM) IV  1 g Intravenous Q8H  . pantoprazole  40 mg Oral Daily  . potassium chloride SA  20 mEq Oral Daily  . pregabalin  100 mg Oral TID AC  . pregabalin  200 mg Oral QHS   Continuous Infusions:    Trei Schoch, DO  [facility] Pager [phone]  If 7PM-7AM, please contact night-coverage [email or web address] Password [email or web address] 11/14/2014, 6:11 PM   LOS: 6 days

## 2014-11-14 NOTE — Progress Notes (Signed)
Dr. Carles Collet notified that pt. C/O chest tightness.  New orders received.

## 2014-11-15 ENCOUNTER — Inpatient Hospital Stay (HOSPITAL_COMMUNITY): Payer: Medicaid Other

## 2014-11-15 ENCOUNTER — Encounter (HOSPITAL_COMMUNITY): Payer: Self-pay | Admitting: Plastic Surgery

## 2014-11-15 DIAGNOSIS — A498 Other bacterial infections of unspecified site: Secondary | ICD-10-CM | POA: Insufficient documentation

## 2014-11-15 DIAGNOSIS — B961 Klebsiella pneumoniae [K. pneumoniae] as the cause of diseases classified elsewhere: Secondary | ICD-10-CM

## 2014-11-15 DIAGNOSIS — M25451 Effusion, right hip: Secondary | ICD-10-CM | POA: Insufficient documentation

## 2014-11-15 DIAGNOSIS — E11621 Type 2 diabetes mellitus with foot ulcer: Secondary | ICD-10-CM

## 2014-11-15 LAB — CBC
HEMATOCRIT: 26.3 % — AB (ref 39.0–52.0)
HEMOGLOBIN: 8.5 g/dL — AB (ref 13.0–17.0)
MCH: 26.6 pg (ref 26.0–34.0)
MCHC: 32.3 g/dL (ref 30.0–36.0)
MCV: 82.4 fL (ref 78.0–100.0)
PLATELETS: 582 10*3/uL — AB (ref 150–400)
RBC: 3.19 MIL/uL — ABNORMAL LOW (ref 4.22–5.81)
RDW: 16.1 % — ABNORMAL HIGH (ref 11.5–15.5)
WBC: 28.5 10*3/uL — ABNORMAL HIGH (ref 4.0–10.5)

## 2014-11-15 LAB — SYNOVIAL CELL COUNT + DIFF, W/ CRYSTALS
Crystals, Fluid: NONE SEEN
WBC, Synovial: 4 /mm3 (ref 0–200)

## 2014-11-15 LAB — BASIC METABOLIC PANEL
ANION GAP: 4 — AB (ref 5–15)
Anion gap: 2 — ABNORMAL LOW (ref 5–15)
BUN: 11 mg/dL (ref 6–23)
BUN: 14 mg/dL (ref 6–23)
CHLORIDE: 104 mmol/L (ref 96–112)
CO2: 23 mmol/L (ref 19–32)
CO2: 24 mmol/L (ref 19–32)
Calcium: 9.9 mg/dL (ref 8.4–10.5)
Calcium: 10 mg/dL (ref 8.4–10.5)
Chloride: 104 mmol/L (ref 96–112)
Creatinine, Ser: 0.75 mg/dL (ref 0.50–1.35)
Creatinine, Ser: 0.83 mg/dL (ref 0.50–1.35)
GFR calc Af Amer: >90 mL/min (ref >90)
GFR calc Af Amer: >90 mL/min (ref >90)
GFR calc non Af Amer: >90 mL/min (ref >90)
GFR calc non Af Amer: >90 mL/min (ref >90)
Glucose, Bld: 174 mg/dL — ABNORMAL HIGH (ref 70–99)
Glucose, Bld: 153 mg/dL — ABNORMAL HIGH (ref 70–99)
Potassium: 5.5 mmol/L — ABNORMAL HIGH (ref 3.5–5.1)
Potassium: 5.3 mmol/L — ABNORMAL HIGH (ref 3.5–5.1)
SODIUM: 130 mmol/L — AB (ref 135–145)
Sodium: 131 mmol/L — ABNORMAL LOW (ref 135–145)

## 2014-11-15 LAB — TROPONIN I: Troponin I: 0.05 ng/mL — ABNORMAL HIGH (ref <0.031)

## 2014-11-15 LAB — GLUCOSE, CAPILLARY: Glucose-Capillary: 148 mg/dL — ABNORMAL HIGH (ref 70–99)

## 2014-11-15 MED ORDER — VANCOMYCIN HCL IN DEXTROSE 750-5 MG/150ML-% IV SOLN
750.0000 mg | Freq: Three times a day (TID) | INTRAVENOUS | Status: DC
  Administered 2014-11-15 – 2014-11-16 (×2): 750 mg via INTRAVENOUS
  Filled 2014-11-15 (×5): qty 150

## 2014-11-15 MED ORDER — IOHEXOL 180 MG/ML  SOLN
10.0000 mL | Freq: Once | INTRAMUSCULAR | Status: AC | PRN
  Administered 2014-11-15: 10 mL via INTRATHECAL

## 2014-11-15 MED ORDER — IOHEXOL 300 MG/ML  SOLN
80.0000 mL | Freq: Once | INTRAMUSCULAR | Status: AC | PRN
  Administered 2014-11-15: 80 mL via INTRAVENOUS

## 2014-11-15 MED ORDER — SODIUM POLYSTYRENE SULFONATE 15 GM/60ML PO SUSP
30.0000 g | Freq: Once | ORAL | Status: AC
  Administered 2014-11-15: 30 g via ORAL
  Filled 2014-11-15: qty 120

## 2014-11-15 MED ORDER — SODIUM CHLORIDE 0.9 % IV SOLN
INTRAVENOUS | Status: DC
  Administered 2014-11-15 – 2014-11-18 (×7): via INTRAVENOUS

## 2014-11-15 NOTE — Clinical Social Work Note (Signed)
CSW continues to follow patient for possible need for ambulance transport home.  Liz Beach MSW, [place], [place], [phone]

## 2014-11-15 NOTE — Progress Notes (Signed)
ANTIBIOTIC CONSULT NOTE - FOLLOW UP  Pharmacy Consult for Meropenem Indication: Bacteremia and sacral wound/osteo/abscess  Allergies  Allergen Reactions  . Cefuroxime Axetil Anaphylaxis  . Morphine And Related Other (See Comments)    Changed mental status, confusion, headache, visual hallucination  . Penicillins Anaphylaxis and Other (See Comments)    ?can take amoxicillin?  . Sulfa Antibiotics Anaphylaxis, Shortness Of Breath and Other (See Comments)  . Tessalon [Benzonatate] Anaphylaxis  . Shellfish Allergy Itching and Other (See Comments)    Took benadryl to alleviate reaction    Patient Measurements: Height: 5\' 8"  (172.7 cm) Weight: 151 lb 10.8 oz (68.8 kg) IBW/kg (Calculated) : 68.4  Vital Signs: Temp: 100.4 F (38 C) (02/08 0651) Temp Source: Oral (02/08 0651) BP: 102/72 mmHg (02/08 0653) Pulse Rate: 92 (02/08 0651) Intake/Output from previous day: 02/07 0701 - 02/08 0700 In: 510 [P.O.:160; I.V.:50; IV Piggyback:300] Out: 5350 [Urine:4100; Drains:250; Stool:1000] Intake/Output from this shift:    Labs:  Recent Labs  11/13/14 0507 11/13/14 1930 11/14/14 0500 11/15/14 0225  WBC 21.8* 22.4* 23.1* 28.5*  HGB 6.9* 8.1* 8.1* 8.5*  PLT 600* 582* 584* 582*  CREATININE 0.81  --  0.74 0.75   Estimated Creatinine Clearance: 130.6 mL/min (by C-G formula based on Cr of 0.75). No results for input(s): VANCOTROUGH, VANCOPEAK, VANCORANDOM, GENTTROUGH, GENTPEAK, GENTRANDOM, TOBRATROUGH, TOBRAPEAK, TOBRARND, AMIKACINPEAK, AMIKACINTROU, AMIKACIN in the last 72 hours.   Microbiology: Recent Results (from the past 720 hour(s))  Urine culture     Status: None   Collection Time: 11/08/14  3:13 PM  Result Value Ref Range Status   Specimen Description URINE, CATHETERIZED  Final   Special Requests NONE  Final   Colony Count   Final    >=100,000 COLONIES/ML Performed at [facility]   Final    BURKHOLDERIA CEPACIA Performed at [facility]    Report Status 11/11/2014 FINAL  Final   Organism ID, Bacteria BURKHOLDERIA CEPACIA  Final      Susceptibility   Burkholderia cepacia - MIC*    LEVOFLOXACIN 4 INTERMEDIATE Intermediate     TRIMETH/SULFA 160 RESISTANT Resistant     CEFTAZIDIME 8 SENSITIVE Sensitive     * BURKHOLDERIA CEPACIA  Culture, blood (routine x 2)     Status: None   Collection Time: 11/08/14  5:25 PM  Result Value Ref Range Status   Specimen Description BLOOD ARM LEFT  Final   Special Requests BOTTLES DRAWN AEROBIC AND ANAEROBIC 10CC  Final   Culture   Final    KLEBSIELLA PNEUMONIAE Note: Culture results may be compromised due to an excessive volume of blood received in culture bottles. Gram Stain Report Called to,Read Back By and Verified With: Juanetta Beets 11/12/14 0144A [place] Performed at [facility]    Report Status 11/14/2014 FINAL  Final   Organism ID, Bacteria KLEBSIELLA PNEUMONIAE  Final      Susceptibility   Klebsiella pneumoniae - MIC*    AMPICILLIN RESISTANT      AMPICILLIN/SULBACTAM 4 SENSITIVE Sensitive     CEFAZOLIN <=4 SENSITIVE Sensitive     CEFEPIME <=1 SENSITIVE Sensitive     CEFTAZIDIME <=1 SENSITIVE Sensitive     CEFTRIAXONE <=1 SENSITIVE Sensitive     CIPROFLOXACIN <=0.25 SENSITIVE Sensitive     GENTAMICIN <=1 SENSITIVE Sensitive     IMIPENEM <=0.25 SENSITIVE Sensitive     PIP/TAZO <=4 SENSITIVE Sensitive     TOBRAMYCIN <=1 SENSITIVE Sensitive     TRIMETH/SULFA <=20 SENSITIVE  Sensitive     * KLEBSIELLA PNEUMONIAE  Culture, blood (routine x 2)     Status: None   Collection Time: 11/08/14  5:35 PM  Result Value Ref Range Status   Specimen Description BLOOD ARM LEFT  Final   Special Requests BOTTLES DRAWN AEROBIC AND ANAEROBIC 10CC  Final   Culture   Final    KLEBSIELLA PNEUMONIAE Note: SUSCEPTIBILITIES PERFORMED ON PREVIOUS CULTURE WITHIN THE LAST 5 DAYS. Gram Stain Report Called to,Read Back By and Verified With: Iran Sizer RN @ [id number] 11/12/14 Performed at [facility]    Report Status 11/14/2014 FINAL  Final  Clostridium Difficile by PCR     Status: None   Collection Time: 11/12/14  8:41 AM  Result Value Ref Range Status   C difficile by pcr NEGATIVE NEGATIVE Final  Culture, blood (routine x 2)     Status: None (Preliminary result)   Collection Time: 11/12/14  8:04 PM  Result Value Ref Range Status   Specimen Description BLOOD LEFT ARM  Final   Special Requests BOTTLES DRAWN AEROBIC AND ANAEROBIC 10CC  Final   Culture   Final           BLOOD CULTURE RECEIVED NO GROWTH TO DATE CULTURE WILL BE HELD FOR 5 DAYS BEFORE ISSUING A FINAL NEGATIVE REPORT Note: Culture results may be compromised due to an excessive volume of blood received in culture bottles. Performed at [facility]    Report Status PENDING  Incomplete  Culture, blood (routine x 2)     Status: None (Preliminary result)   Collection Time: 11/12/14  8:19 PM  Result Value Ref Range Status   Specimen Description BLOOD LEFT ARM  Final   Special Requests   Final    BOTTLES DRAWN AEROBIC AND ANAEROBIC BLUE 10CC RED 5CC   Culture   Final           BLOOD CULTURE RECEIVED NO GROWTH TO DATE CULTURE WILL BE HELD FOR 5 DAYS BEFORE ISSUING A FINAL NEGATIVE REPORT Performed at [facility]    Report Status PENDING  Incomplete    Anti-infectives    Start     Dose/Rate Route Frequency Ordered Stop   11/12/14 1317  polymyxin B 500,000 Units, bacitracin 50,000 Units in sodium chloride irrigation 0.9 % 500 mL irrigation  Status:  Discontinued       As needed 11/12/14 1319 11/12/14 1417   11/09/14 1400  linezolid (ZYVOX) tablet 600 mg     600 mg Oral Every 12 hours 11/09/14 1319     11/09/14 0700  vancomycin (VANCOCIN) 500 mg in sodium chloride 0.9 % 100 mL IVPB  Status:  Discontinued     500 mg100 mL/hr over 60 Minutes Intravenous Every 12 hours 11/08/14 1732 11/08/14 1737   11/08/14 2200  vancomycin (VANCOCIN) IVPB 750 mg/150 ml premix  Status:  Discontinued     750 mg150  mL/hr over 60 Minutes Intravenous Every 24 hours 11/08/14 1737 11/09/14 1319   11/08/14 1800  meropenem (MERREM) 1 g in sodium chloride 0.9 % 100 mL IVPB     1 g200 mL/hr over 30 Minutes Intravenous Every 8 hours 11/08/14 1732     11/08/14 1745  vancomycin (VANCOCIN) 1,250 mg in sodium chloride 0.9 % 250 mL IVPB  Status:  Discontinued     1,250 mg166.7 mL/hr over 90 Minutes Intravenous  Once 11/08/14 1732 11/08/14 1734      Assessment: 37 YOM with paraplegia, presents with  a decubitus ulcer that has required extensive debridement. He was being followed by Dr. Tommy Medal prior to admission and was receiving IV vancomycin. 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   The patient's renal function remains stable. Tmax/24h: 100.4, WBC up to 28.5 - Meropenem dose remains appropriate however will follow-up with any recommended changes per ID given sensitivities of blood cultures and fevers/WBC elevation.    Goal of Therapy:  Proper antibiotics for infection/cultures adjusted for renal/hepatic function   Plan:  1. Continue Meropenem 1g IV every 8 hours 2. Will continue to follow renal function, culture results, LOT, and antibiotic de-escalation plans   Alycia Rossetti, PharmD, BCPS Clinical Pharmacist Pager: [phone] 11/15/2014 2:43 PM

## 2014-11-15 NOTE — Progress Notes (Signed)
PT Cancellation Note  Patient Details Name: Jason [name] MRN: [id number] DOB: 1986/02/04   Cancelled Treatment:    Reason Eval/Treat Not Completed: Patient at procedure or test/unavailable.  Pt leaving with transporters for a test.  PT will attempt to check back later this PM as time allows.   Thanks,   Barbarann Ehlers. Emmaclaire Switala, PT, DPT [phone]   11/15/2014, 11:33 AM

## 2014-11-15 NOTE — Progress Notes (Addendum)
INITIAL NUTRITION ASSESSMENT  DOCUMENTATION CODES Per approved criteria  -Not Applicable   INTERVENTION: Continue Glucerna Shake po TID, each supplement provides 220 kcal and 10 grams of protein  Continue Prostat daily  Provide diet education as appropriate.   NUTRITION DIAGNOSIS: Increased nutrient needs related to wounds as evidenced by estimated needs.   Goal: Pt to meet >/= 90% of their estimated nutrition needs   Monitor:  PO intake, supplement acceptance, diet compliance  Reason for Assessment: Wounds  29 y.o. male  Admitting Dx: Sepsis  ASSESSMENT: Pt with sacral wound, infection, osteomyelitis, abscesses, pt had been on IV vanc x 3 weeks. 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  ID following for abx changes. S/P debridement 2/5 with VAC.   Unable to speak with pt x 2, pt working with therapies. Unable to complete nutrition-focused physical exam at this time.   Height: Ht Readings from Last 1 Encounters:  11/08/14 5\' 8"  (1.727 m)    Weight: Wt Readings from Last 1 Encounters:  11/15/14 151 lb 10.8 oz (68.8 kg)    Ideal Body Weight: 63 kg   % Ideal Body Weight: 109%  Wt Readings from Last 10 Encounters:  11/15/14 151 lb 10.8 oz (68.8 kg)  10/02/14 125 lb 10.6 oz (57 kg)  09/23/14 131 lb 1.6 oz (59.467 kg)  08/13/14 129 lb 6.6 oz (58.7 kg)  08/03/14 139 lb (63.05 kg)  07/30/14 139 lb 1 oz (63.078 kg)  07/19/14 135 lb 12.9 oz (61.6 kg)  05/28/14 138 lb 14.2 oz (63 kg)  05/20/14 141 lb (63.957 kg)  04/19/14 140 lb (63.504 kg)    Usual Body Weight: 140 lb   % Usual Body Weight: > 100%  BMI:  Body mass index is 23.07 kg/(m^2).  Estimated Nutritional Needs: Kcal: 2100-2300 Protein: 104-114 grams Fluid: 2.1-2.3 L  Skin: stage II finger, stage II buttocks, stage III heel, stage IV buttocks, stage IV ishial tuberosity, incisions  Diet Order: Diet Carb Modified  EDUCATION NEEDS: -Education not appropriate at this  time   Intake/Output Summary (Last 24 hours) at 11/15/14 1257 Last data filed at 11/15/14 0700  Gross per 24 hour  Intake    510 ml  Output   3850 ml  Net  -3340 ml    Last BM: 2/8 via colostomy   Labs:   Recent Labs Lab 11/13/14 0507 11/14/14 0500 11/15/14 0225  NA 135 135 131*  K 4.9 4.9 5.5*  CL 109 109 104  CO2 22 24 23   BUN 12 11 11   CREATININE 0.81 0.74 0.75  CALCIUM 10.0 9.6 9.9  GLUCOSE 209* 145* 174*    CBG (last 3)   Recent Labs  11/14/14 1651 11/14/14 2202 11/15/14 0743  GLUCAP 86 193* 148*    Scheduled Meds: . sodium chloride   Intravenous Once  . atorvastatin  20 mg Oral q1800  . baclofen  10 mg Oral BID WC  . baclofen  20 mg Oral QHS  . feeding supplement (GLUCERNA SHAKE)  237 mL Oral TID BM  . feeding supplement (PRO-STAT SUGAR FREE 64)  30 mL Oral Daily  . fentaNYL  25 mcg Transdermal Q72H  . heparin  5,000 Units Subcutaneous 3 times per day  . insulin aspart  0-5 Units Subcutaneous QHS  . insulin aspart  0-9 Units Subcutaneous TID WC  . insulin aspart  4 Units Subcutaneous TID AC  . insulin glargine  25 Units Subcutaneous QHS  . linezolid  600 mg Oral Q12H  . loratadine  10 mg Oral Daily  . meropenem (MERREM) IV  1 g Intravenous Q8H  . pantoprazole  40 mg Oral Daily  . pregabalin  100 mg Oral TID AC  . pregabalin  200 mg Oral QHS    Continuous Infusions:   Past Medical History  Diagnosis Date  . GERD (gastroesophageal reflux disease)   . Asthma   . Hx MRSA infection     on face  . Gastroparesis   . Diabetic neuropathy   . Seizures   . Stroke     spinal stroke in 4/15  . Diabetes mellitus     sees Dr. Loanne Drilling   . Family history of anesthesia complication     Pt mother can't have epidural procedures  . Dysrhythmia   . Pneumonia   . Arthritis   . Fibromyalgia     Past Surgical History  Procedure Laterality Date  . Tonsillectomy    . Multiple extractions with alveoloplasty N/A 08/03/2014    Procedure: MULTIPLE  EXTRACTIONS;  Surgeon: Gae Bon, DDS;  Location: [place];  Service: Oral Surgery;  Laterality: N/A;  . Tee without cardioversion N/A 08/17/2014    Procedure: TRANSESOPHAGEAL ECHOCARDIOGRAM (TEE);  Surgeon: Dorothy Spark, MD;  Location[facility] ENDOSCOPY;  Service: Cardiovascular;  Laterality: N/A;  . Debridment of decubitus ulcer N/A 10/04/2014    Procedure: DEBRIDMENT OF DECUBITUS ULCER;  Surgeon: Georganna Skeans, MD;  Location: [facility];  Service: General;  Laterality: N/A;  . Laparoscopic diverted colostomy N/A 10/12/2014    Procedure: LAPAROSCOPIC DIVERTING COLOSTOMY;  Surgeon: Donnie Mesa, MD;  Location: [place];  Service: General;  Laterality: N/A;  . Insertion of suprapubic catheter N/A 10/12/2014    Procedure: INSERTION OF SUPRAPUBIC CATHETER;  Surgeon: Reece Packer, MD;  Location: [place];  Service: Urology;  Laterality: N/A;  . Incision and drainage of wound N/A 11/12/2014    Procedure: IRRIGATION AND DEBRIDEMENT OF WOUNDS WITH BONE BIOPSY AND SURGICAL PREP ;  Surgeon: Theodoro Kos, DO;  Location: [place];  Service: Plastics;  Laterality: N/A;  . Application of a-cell of back N/A 11/12/2014    Procedure: APPLICATION A CELL AND VAC ;  Surgeon: Theodoro Kos, DO;  Location: [facility];  Service: Plastics;  Laterality: N/A;   Maylon Peppers RD, [place], [place] Pager [phone] After Hours Pager

## 2014-11-15 NOTE — Progress Notes (Signed)
Physical Therapy Treatment Patient Details Name: Jason [name] MRN: [id number] DOB: 11/30/1985 Today's Date: 11/15/2014    History of Present Illness 29 y.o. male admitted from home to [facility] on 11/08/14 for decubitus ulcers infection management and osteomyelitis.  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 Pt underwent surgical cleaning and debridement of his sacral and ischial wounds on 11/12/14 with palcement of Acell and biopsy of coccx bone.  Wound vac placed as well.     PT Comments    Pt with significant improvements with participation and mental alertness today.  He was able to sit for a longer period of time EOB and took rest breaks as needed.  His overall assist level was also less today than previous sessions.  I still believe he would benefit from CIR level therapies to get him back to being able to transfer to his WC with minimal assist from his mom and dad.  PT will continue to follow acutely.    Follow Up Recommendations  CIR     Equipment Recommendations  Other (comment) (working with Jason [name] at [facility] to get updated WC )    Recommendations for [facility] consult     Precautions / Restrictions Precautions Precautions: Fall;Other (comment) (multiple wounds) Precaution Comments: Per mom, wound care MD PTA did not want him up for longer than an hour at a time due to his wounds.   Pt also now has wound vac.  Has PTA left ostomy bag and suprapubic catheter.     Mobility  Bed Mobility Overal bed mobility: Needs Assistance Bed Mobility: Rolling;Sidelying to Sit;Sit to Sidelying Rolling: Mod assist Sidelying to sit: Mod assist;+2 for safety/equipment     Sit to sidelying: +2 for safety/equipment;Mod assist General bed mobility comments: Two person assist for safety today as pt  is a bit unsteady on compliant airmattress during transitions, but overall mod assist to help position legs and then hook arms to push up to sitting.  Pt is able to get onto left elbow in sidelying, but has difficulty pushing all the way up to sitting EOB without assist.    Transfers                 General transfer comment: Pt not agreeable to do transfer OOB to chair today (did not really state why), but is agreeable next session.           Balance Overall balance assessment: Needs assistance Sitting-balance support: Feet supported;Bilateral upper extremity supported Sitting balance-Leahy Scale: Poor Sitting balance - Comments: Pt sat EOB multiple times with sidelying on elbow rest breaks x 3 with min to mod assist support at trunk for balance and manual assist for hand placement to find balance point.  Pt better able to maintain sitting balance EOB and did so until fatigue x 3.  We left air mattress inflated in hopes to decrease pressure on wounds and decrease shearing during transitions to sit, however, it does make for a more challenging sitting surface.                              Cognition Arousal/Alertness: Awake/alert Behavior During Therapy: WFL for tasks assessed/performed Overall Cognitive Status: Within Functional Limits for tasks assessed  General Comments General comments (skin integrity, edema, etc.): Pt with wound vac on sacral wound.      Pertinent Vitals/Pain Pain Assessment: No/denies pain           PT Goals (current goals can now be found in the care plan section) Acute Rehab PT Goals Patient Stated Goal: to decrease pain, go home Progress towards PT goals: Progressing toward goals    Frequency  Min 3X/week    PT Plan Current plan remains appropriate       End of Session   Activity Tolerance: Patient limited by fatigue Patient left: in bed;with call bell/phone within reach;with family/visitor  present     Time: 4818-5909 PT Time Calculation (min) (ACUTE ONLY): 40 min  Charges:  $Therapeutic Activity: 38-52 mins                      Jason [name] B. [place], [place], DPT [phone]   11/15/2014, 5:11 PM

## 2014-11-15 NOTE — Progress Notes (Addendum)
PROGRESS NOTE  Jason [name] QQV:[id number] DOB: 04-30-1986 DOA: 11/08/2014 PCP: [name], MD   Assessment/Plan: Sepsis  -secondary to sacral wound infection/pelvic osteomyelitis/intramuscular abscesses -appreciate ID consult -abx per ID -IVF -stool for Cdiff--neg -lactic acid--1.2  -WBC continues to rise -11/15/14 CXR with LLL opacity/effusion -plan CT chest--if effusion-->possible thoracocentesis Bacteremia--Klebsiella PNA -GNR from 2 of 2 sets on 11/08/14 -antibiotic changes per ID -11/15/14 removed PICC line--surveillance blood cultures remain neg Delirium -secondary to opioids -More alert since discontinuation of intravenous opioids--> Back to baseline -pt had this issue on previous admissions -fine balance between pain control and delirium -discussed with mother at bedside-->d/c Dilaudid, restart home percocet Pelvic osteomyelitis/intramuscular abscess/septic arthritis -Failed IV Vanco for the last 3 weeks via PICC -Continue Zyvox and meropenem -abx changes per ID -appreciate plastic surgery--appreciate Dr. Vevelyn Francois 11/12/14 with [facility] -MRI pelvis--right ischial tuberosity with marrow edema, intramuscular abscesses, small right joint effusion with enhancement -appreciate Dr. Dennison Mascot noted for aspiration under fluoro -11/15/14 hip aspirate-->only 4 WBC Burkholderia Bacteruria -unusual organism in urine -appreciate Dr. Tresa Moore-- suprapubic cath changed 11/12/14 Atypical Chest pain -cycle troponins -EKG--no concerning ischemic changes AKI -secondary to sepsis and volume depletion; early repolarization -improved Protein calorie malnutrition -Continue protein supplement Hyperkalemia -Kayexalate -IVF Diabetes mellitus type 1 -Continue Lantus and NovoLog sliding scale - hemoglobin A1c--8.8 -11/12/14--pt had hypoglycemia today due to being npo for surgery-->decrease lantus to 20 units -11/13/14--increase Lantus to 25 units at bedtime as the  patient's oral intake is improved Spinal cord infarction history  -Patient has suprapubic catheter placed by Dr. McDiarmid 3 weeks ago Tobacco abuse -counseled to quit Iron deficiency anemia  -Iron saturation 6%  -Start ferrous sulfate    Family Communication: Mother updated at beside Disposition Plan: CIR when cleared by ID       Procedures/Studies: Mr Pelvis W Wo Contrast  11/10/2014   CLINICAL DATA:  Quadriplegia. Sacral decubitus ulcer. Sepsis. Foul-smelling discharge from the decubitus ulcer.  EXAM: MRI PELVIS WITHOUT AND WITH CONTRAST  TECHNIQUE: Multiplanar multisequence MR imaging of the pelvis was performed both before and after administration of intravenous contrast.  CONTRAST:  56mL MULTIHANCE GADOBENATE DIMEGLUMINE 529 MG/ML IV SOLN  COMPARISON:  10/02/2014  FINDINGS: Again noted are decubitus ulcers over the ischial tuberosities, right larger than left. The right decubitus ulcer extends down to the ischial tuberosity with underlying marrow edema and cortical irregularity consistent with osteomyelitis. There is mild diffuse edema of the pelvic musculature with mild enhancement. There are interval development of multiple small nonenhancing fluid collections within the right adductor magnus and adductor brevis muscles extending from the right ischial tuberosity most consistent with intramuscular abscesses. The conglomeration of the area measures approximately 4.8 x 2 cm, but each individual collection measures significantly smaller than this. There is a rind of nonenhancement around the wound consistent with necrosis.  There is no marrow signal abnormality involving the left ischial tuberosity.  Bilateral hips demonstrate no fracture, dislocation or avascular necrosis. There is a small right hip joint effusion with mild synovial enhancement. There is no bursa formation. There is generalized edema within the subcutaneous fat throughout the pelvis.  IMPRESSION: 1. Again noted is a  decubitus ulcer over the ischial tuberosities bilaterally, right larger than left. The right decubitus ulcer extends down to the ischial tuberosity with underlying marrow edema, cortical irregularity and enhancement consistent with osteomyelitis similar in appearance to the prior exam. There is mild diffuse edema throughout the pelvic musculature with mild  enhancement with interval development of small nonenhancing fluid collections within the right adductor magnus and adductor brevis muscles extending from the right ischial tuberosity most consistent with intramuscular abscesses. 2. Small right joint effusion with mild synovial enhancement. Septic arthritis cannot be excluded. If there is clinical concern recommend arthrocentesis. 3. Decubitus ulcer overlying the left ischial tuberosity without underlying osseous abnormality. 4. There is diffuse mild edema of the pelvic musculature which is likely neurogenic. There is a concern of an element of infectious myositis especially in the right adductor and gluteal compartment.   Electronically Signed   By: Kathreen Devoid   On: 11/10/2014 17:12   Dg Chest Port 1 View  11/15/2014   CLINICAL DATA:  Subsequent encounter for pneumonia.  EXAM: PORTABLE CHEST - 1 VIEW  COMPARISON:  11/08/2014  FINDINGS: The right PICC line is stable. The tip is near the cavoatrial junction. The cardiac silhouette, mediastinal and hilar contours are within normal limits and stable. Persistent left lower lobe process, likely pneumonia and small parapneumonic effusion. The right lung remains clear.  IMPRESSION: Persistent left lower lobe process likely a combination of infiltrate and effusion.   Electronically Signed   By: Kalman Jewels M.D.   On: 11/15/2014 12:47   Dg Chest Port 1 View  11/08/2014   CLINICAL DATA:  Cough, elevated white count  EXAM: PORTABLE CHEST - 1 VIEW  COMPARISON:  10/10/2014  FINDINGS: Consolidation in the left lower lobe, similar to prior study. Increasing right medial  basilar airspace opacity also noted. Findings compatible with pneumonia. No effusions. Right PICC are remains in place, unchanged.  IMPRESSION: Stable left lower lobe pneumonia. Increasing right medial basilar density, also likely pneumonia.   Electronically Signed   By: Rolm Baptise M.D.   On: 11/08/2014 18:48   Dg Fluoro Guide Ndl Plc/bx  11/15/2014   CLINICAL DATA:  RIGHT HIP ASPIRATION INFECTION. PATIENT HAS DECUBITUS ULCERS.  EXAM: RIGHT HIP ASPIRATION UNDER FLUOROSCOPY  FLUOROSCOPY TIME:  If the device does not provide the exposure index:  Fluoroscopy Time (in minutes and seconds):  7 SECONDS  Number of Acquired Images:  0  PROCEDURE: Overlying skin prepped with Betadine, draped in the usual sterile fashion, and infiltrated locally with buffered Lidocaine. Curved 22 gauge spinal needle advanced to the superolateral margin of the RIGHT femoral head. Diagnostic injection of iodinated contrast demonstrates intra-articular spread without intravascular component.  Aspiration was attempted with no return of fluid. Lavage of the right hip joint was performed with 5 mL of sterile saline with subsequent aspiration of 1 mL of saline from the right hip joint. The spinal needle was removed.  The patient tolerated the procedure well. There were no immediate complications. Hemostasis was achieved.  IMPRESSION: Technically successful right hip aspiration under fluoroscopy. 1 mL of clear right hip joint aspirate was sent for laboratory analysis.   Electronically Signed   By: Kathreen Devoid   On: 11/15/2014 11:34         Subjective: Patient denies fevers, chills, headache, chest pain, dyspnea, nausea, vomiting, diarrhea, abdominal pain, dysuria, hematuria   Objective: Filed Vitals:   11/15/14 0651 11/15/14 0653 11/15/14 0653 11/15/14 1733  BP: 95/68  102/72 122/81  Pulse: 92   84  Temp: 100.4 F (38 C)   98.2 F (36.8 C)  TempSrc: Oral   Oral  Resp: 19   16  Height:      Weight:  68.8 kg (151 lb 10.8 oz)     SpO2: 100%  99%    Intake/Output Summary (Last 24 hours) at 11/15/14 1757 Last data filed at 11/15/14 1740  Gross per 24 hour  Intake    990 ml  Output   5450 ml  Net  -4460 ml   Weight change:  Exam:   General:  Pt is alert, follows commands appropriately, not in acute distress  HEENT: No icterus, No thrush, [place]/AT  Cardiovascular: RRR, S1/S2, no rubs, no gallops  Respiratory: Bibasilar crackles, left greater than right. No wheezing.  Abdomen: Soft/+BS, non tender, non distended, no guarding  Extremities: No edema, No lymphangitis, No petechiae, No rashes, no synovitis  Data Reviewed: Basic Metabolic Panel:  Recent Labs Lab 11/11/14 0500 11/12/14 0455 11/12/14 1408 11/13/14 0507 11/14/14 0500 11/15/14 0225  NA 137 138 141 135 135 131*  K 4.1 4.6 4.8 4.9 4.9 5.5*  CL 113* 111  --  109 109 104  CO2 21 23  --  22 24 23   GLUCOSE 127* 160* 56* 209* 145* 174*  BUN 12 15  --  12 11 11   CREATININE 1.06 1.15  --  0.81 0.74 0.75  CALCIUM 10.7* 10.3  --  10.0 9.6 9.9   Liver Function Tests: No results for input(s): AST, ALT, ALKPHOS, BILITOT, PROT, ALBUMIN in the last 168 hours. No results for input(s): LIPASE, AMYLASE in the last 168 hours. No results for input(s): AMMONIA in the last 168 hours. CBC:  Recent Labs Lab 11/12/14 0455 11/12/14 1408 11/13/14 0507 11/13/14 1930 11/14/14 0500 11/15/14 0225  WBC 17.5*  --  21.8* 22.4* 23.1* 28.5*  HGB 7.0* 7.8* 6.9* 8.1* 8.1* 8.5*  HCT 22.1* 23.0* 22.0* 25.2* 24.8* 26.3*  MCV 81.3  --  82.7 82.1 82.1 82.4  PLT 565*  --  600* 582* 584* 582*   Cardiac Enzymes:  Recent Labs Lab 11/14/14 1351 11/14/14 1958 11/15/14 0223  TROPONINI <0.03 0.04* 0.05*   BNP: Invalid input(s): POCBNP CBG:  Recent Labs Lab 11/14/14 0811 11/14/14 1204 11/14/14 1651 11/14/14 2202 11/15/14 0743  GLUCAP 109* 82 86 193* 148*    Recent Results (from the past 240 hour(s))  Urine culture     Status: None   Collection Time:  11/08/14  3:13 PM  Result Value Ref Range Status   Specimen Description URINE, CATHETERIZED  Final   Special Requests NONE  Final   Colony Count   Final    >=100,000 COLONIES/ML Performed at [place] Performed at [facility]    Report Status 11/11/2014 FINAL  Final   Organism ID, Bacteria BURKHOLDERIA CEPACIA  Final      Susceptibility   Burkholderia cepacia - MIC*    LEVOFLOXACIN 4 INTERMEDIATE Intermediate     TRIMETH/SULFA 160 RESISTANT Resistant     CEFTAZIDIME 8 SENSITIVE Sensitive     * BURKHOLDERIA CEPACIA  Culture, blood (routine x 2)     Status: None   Collection Time: 11/08/14  5:25 PM  Result Value Ref Range Status   Specimen Description BLOOD ARM LEFT  Final   Special Requests BOTTLES DRAWN AEROBIC AND ANAEROBIC 10CC  Final   Culture   Final    KLEBSIELLA PNEUMONIAE Note: Culture results may be compromised due to an excessive volume of blood received in culture bottles. Gram Stain Report Called to,Read Back By and Verified With: Juanetta Beets 11/12/14 0144A [place] Performed at [facility]    Report Status 11/14/2014 FINAL  Final   Organism ID, Bacteria KLEBSIELLA PNEUMONIAE  Final      Susceptibility   Klebsiella pneumoniae - MIC*    AMPICILLIN RESISTANT      AMPICILLIN/SULBACTAM 4 SENSITIVE Sensitive     CEFAZOLIN <=4 SENSITIVE Sensitive     CEFEPIME <=1 SENSITIVE Sensitive     CEFTAZIDIME <=1 SENSITIVE Sensitive     CEFTRIAXONE <=1 SENSITIVE Sensitive     CIPROFLOXACIN <=0.25 SENSITIVE Sensitive     GENTAMICIN <=1 SENSITIVE Sensitive     IMIPENEM <=0.25 SENSITIVE Sensitive     PIP/TAZO <=4 SENSITIVE Sensitive     TOBRAMYCIN <=1 SENSITIVE Sensitive     TRIMETH/SULFA <=20 SENSITIVE Sensitive     * KLEBSIELLA PNEUMONIAE  Culture, blood (routine x 2)     Status: None   Collection Time: 11/08/14  5:35 PM  Result Value Ref Range Status   Specimen Description BLOOD ARM LEFT  Final    Special Requests BOTTLES DRAWN AEROBIC AND ANAEROBIC 10CC  Final   Culture   Final    KLEBSIELLA PNEUMONIAE Note: SUSCEPTIBILITIES PERFORMED ON PREVIOUS CULTURE WITHIN THE LAST 5 DAYS. Gram Stain Report Called to,Read Back By and Verified With: Iran Sizer RN @ 564PP [facility] 11/12/14 Performed at [facility]    Report Status 11/14/2014 FINAL  Final  Clostridium Difficile by PCR     Status: None   Collection Time: 11/12/14  8:41 AM  Result Value Ref Range Status   C difficile by pcr NEGATIVE NEGATIVE Final  Culture, blood (routine x 2)     Status: None (Preliminary result)   Collection Time: 11/12/14  8:04 PM  Result Value Ref Range Status   Specimen Description BLOOD LEFT ARM  Final   Special Requests BOTTLES DRAWN AEROBIC AND ANAEROBIC 10CC  Final   Culture   Final           BLOOD CULTURE RECEIVED NO GROWTH TO DATE CULTURE WILL BE HELD FOR 5 DAYS BEFORE ISSUING A FINAL NEGATIVE REPORT Note: Culture results may be compromised due to an excessive volume of blood received in culture bottles. Performed at [facility]    Report Status PENDING  Incomplete  Culture, blood (routine x 2)     Status: None (Preliminary result)   Collection Time: 11/12/14  8:19 PM  Result Value Ref Range Status   Specimen Description BLOOD LEFT ARM  Final   Special Requests   Final    BOTTLES DRAWN AEROBIC AND ANAEROBIC BLUE 10CC RED 5CC   Culture   Final           BLOOD CULTURE RECEIVED NO GROWTH TO DATE CULTURE WILL BE HELD FOR 5 DAYS BEFORE ISSUING A FINAL NEGATIVE REPORT Performed at [facility]    Report Status PENDING  Incomplete     Scheduled Meds: . sodium chloride   Intravenous Once  . atorvastatin  20 mg Oral q1800  . baclofen  10 mg Oral BID WC  . baclofen  20 mg Oral QHS  . feeding supplement (GLUCERNA SHAKE)  237 mL Oral TID BM  . feeding supplement (PRO-STAT SUGAR FREE 64)  30 mL Oral Daily  . fentaNYL  25 mcg Transdermal Q72H  . heparin  5,000 Units Subcutaneous 3  times per day  . insulin aspart  0-5 Units Subcutaneous QHS  . insulin aspart  0-9 Units Subcutaneous TID WC  . insulin aspart  4 Units Subcutaneous TID AC  . insulin glargine  25 Units Subcutaneous QHS  .  loratadine  10 mg Oral Daily  . meropenem (MERREM) IV  1 g Intravenous Q8H  . pantoprazole  40 mg Oral Daily  . pregabalin  100 mg Oral TID AC  . pregabalin  200 mg Oral QHS  . vancomycin  750 mg Intravenous Q8H   Continuous Infusions:    Nykeria Mealing, DO  [facility] Pager [phone]  If 7PM-7AM, please contact night-coverage [email or web address] Password [email or web address] 11/15/2014, 5:57 PM   LOS: 7 days

## 2014-11-15 NOTE — Progress Notes (Signed)
ANTIBIOTIC CONSULT NOTE - INITIAL  Pharmacy Consult for vancomycin Indication: Decubitus ulcers pelvic osteomyelitis with intrramuscular abscesses  Allergies  Allergen Reactions  . Cefuroxime Axetil Anaphylaxis  . Morphine And Related Other (See Comments)    Changed mental status, confusion, headache, visual hallucination  . Penicillins Anaphylaxis and Other (See Comments)    ?can take amoxicillin?  . Sulfa Antibiotics Anaphylaxis, Shortness Of Breath and Other (See Comments)  . Tessalon [Benzonatate] Anaphylaxis  . Shellfish Allergy Itching and Other (See Comments)    Took benadryl to alleviate reaction    Patient Measurements: Height: 5\' 8"  (172.7 cm) Weight: 151 lb 10.8 oz (68.8 kg) IBW/kg (Calculated) : 68.4 Adjusted Body Weight:   Vital Signs: Temp: 100.4 F (38 C) (02/08 0651) Temp Source: Oral (02/08 0651) BP: 102/72 mmHg (02/08 0653) Pulse Rate: 92 (02/08 0651) Intake/Output from previous day: 02/07 0701 - 02/08 0700 In: 510 [P.O.:160; I.V.:50; IV Piggyback:300] Out: 5350 [Urine:4100; Drains:250; Stool:1000] Intake/Output from this shift:    Labs:  Recent Labs  11/13/14 0507 11/13/14 1930 11/14/14 0500 11/15/14 0225  WBC 21.8* 22.4* 23.1* 28.5*  HGB 6.9* 8.1* 8.1* 8.5*  PLT 600* 582* 584* 582*  CREATININE 0.81  --  0.74 0.75   Estimated Creatinine Clearance: 130.6 mL/min (by C-G formula based on Cr of 0.75). No results for input(s): VANCOTROUGH, VANCOPEAK, VANCORANDOM, GENTTROUGH, GENTPEAK, GENTRANDOM, TOBRATROUGH, TOBRAPEAK, TOBRARND, AMIKACINPEAK, AMIKACINTROU, AMIKACIN in the last 72 hours.   Microbiology: Recent Results (from the past 720 hour(s))  Urine culture     Status: None   Collection Time: 11/08/14  3:13 PM  Result Value Ref Range Status   Specimen Description URINE, CATHETERIZED  Final   Special Requests NONE  Final   Colony Count   Final    >=100,000 COLONIES/ML Performed at [place]   Final    BURKHOLDERIA  CEPACIA Performed at [facility]    Report Status 11/11/2014 FINAL  Final   Organism ID, Bacteria BURKHOLDERIA CEPACIA  Final      Susceptibility   Burkholderia cepacia - MIC*    LEVOFLOXACIN 4 INTERMEDIATE Intermediate     TRIMETH/SULFA 160 RESISTANT Resistant     CEFTAZIDIME 8 SENSITIVE Sensitive     * BURKHOLDERIA CEPACIA  Culture, blood (routine x 2)     Status: None   Collection Time: 11/08/14  5:25 PM  Result Value Ref Range Status   Specimen Description BLOOD ARM LEFT  Final   Special Requests BOTTLES DRAWN AEROBIC AND ANAEROBIC 10CC  Final   Culture   Final    KLEBSIELLA PNEUMONIAE Note: Culture results may be compromised due to an excessive volume of blood received in culture bottles. Gram Stain Report Called to,Read Back By and Verified With: Juanetta Beets 11/12/14 0144A [place] Performed at [facility]    Report Status 11/14/2014 FINAL  Final   Organism ID, Bacteria KLEBSIELLA PNEUMONIAE  Final      Susceptibility   Klebsiella pneumoniae - MIC*    AMPICILLIN RESISTANT      AMPICILLIN/SULBACTAM 4 SENSITIVE Sensitive     CEFAZOLIN <=4 SENSITIVE Sensitive     CEFEPIME <=1 SENSITIVE Sensitive     CEFTAZIDIME <=1 SENSITIVE Sensitive     CEFTRIAXONE <=1 SENSITIVE Sensitive     CIPROFLOXACIN <=0.25 SENSITIVE Sensitive     GENTAMICIN <=1 SENSITIVE Sensitive     IMIPENEM <=0.25 SENSITIVE Sensitive     PIP/TAZO <=4 SENSITIVE Sensitive     TOBRAMYCIN <=1 SENSITIVE Sensitive  TRIMETH/SULFA <=20 SENSITIVE Sensitive     * KLEBSIELLA PNEUMONIAE  Culture, blood (routine x 2)     Status: None   Collection Time: 11/08/14  5:35 PM  Result Value Ref Range Status   Specimen Description BLOOD ARM LEFT  Final   Special Requests BOTTLES DRAWN AEROBIC AND ANAEROBIC 10CC  Final   Culture   Final    KLEBSIELLA PNEUMONIAE Note: SUSCEPTIBILITIES PERFORMED ON PREVIOUS CULTURE WITHIN THE LAST 5 DAYS. Gram Stain Report Called to,Read Back By and Verified With: Iran Sizer RN @  892[facility] 11/12/14 Performed at [facility]    Report Status 11/14/2014 FINAL  Final  Clostridium Difficile by PCR     Status: None   Collection Time: 11/12/14  8:41 AM  Result Value Ref Range Status   C difficile by pcr NEGATIVE NEGATIVE Final  Culture, blood (routine x 2)     Status: None (Preliminary result)   Collection Time: 11/12/14  8:04 PM  Result Value Ref Range Status   Specimen Description BLOOD LEFT ARM  Final   Special Requests BOTTLES DRAWN AEROBIC AND ANAEROBIC 10CC  Final   Culture   Final           BLOOD CULTURE RECEIVED NO GROWTH TO DATE CULTURE WILL BE HELD FOR 5 DAYS BEFORE ISSUING A FINAL NEGATIVE REPORT Note: Culture results may be compromised due to an excessive volume of blood received in culture bottles. Performed at [facility]    Report Status PENDING  Incomplete  Culture, blood (routine x 2)     Status: None (Preliminary result)   Collection Time: 11/12/14  8:19 PM  Result Value Ref Range Status   Specimen Description BLOOD LEFT ARM  Final   Special Requests   Final    BOTTLES DRAWN AEROBIC AND ANAEROBIC BLUE 10CC RED 5CC   Culture   Final           BLOOD CULTURE RECEIVED NO GROWTH TO DATE CULTURE WILL BE HELD FOR 5 DAYS BEFORE ISSUING A FINAL NEGATIVE REPORT Performed at [facility]    Report Status PENDING  Incomplete    Medical History: Past Medical History  Diagnosis Date  . GERD (gastroesophageal reflux disease)   . Asthma   . Hx MRSA infection     on face  . Gastroparesis   . Diabetic neuropathy   . Seizures   . Stroke     spinal stroke in 4/15  . Diabetes mellitus     sees Dr. Loanne Drilling   . Family history of anesthesia complication     Pt mother can't have epidural procedures  . Dysrhythmia   . Pneumonia   . Arthritis   . Fibromyalgia     Medications:  Scheduled:  . sodium chloride   Intravenous Once  . atorvastatin  20 mg Oral q1800  . baclofen  10 mg Oral BID WC  . baclofen  20 mg Oral QHS  .  feeding supplement (GLUCERNA SHAKE)  237 mL Oral TID BM  . feeding supplement (PRO-STAT SUGAR FREE 64)  30 mL Oral Daily  . fentaNYL  25 mcg Transdermal Q72H  . heparin  5,000 Units Subcutaneous 3 times per day  . insulin aspart  0-5 Units Subcutaneous QHS  . insulin aspart  0-9 Units Subcutaneous TID WC  . insulin aspart  4 Units Subcutaneous TID AC  . insulin glargine  25 Units Subcutaneous QHS  . loratadine  10 mg Oral Daily  .  meropenem (MERREM) IV  1 g Intravenous Q8H  . pantoprazole  40 mg Oral Daily  . pregabalin  100 mg Oral TID AC  . pregabalin  200 mg Oral QHS   Infusions:   Assessment: 29 yo male with decubitus ulcers pelvic osteomyelitis with intrramuscular abscesses will be changed from linezolid to vancomycin.  Renal function is better now at 0.75 with a CrCl > 100.  Patient was on 750 mg iv q24h last month and trough was subtherapeutic but renal function at that time was not as good.  Goal of Therapy:  Vancomycin trough level 15-20 mcg/ml  Plan:  - vancomycin 750 mg iv q8h - check vancomycin trough at steady state - monitor renal function closely  Glorianne Proctor, [place]-[place] 11/15/2014,4:05 PM

## 2014-11-15 NOTE — Progress Notes (Signed)
[facility]    Subjective: Sleep again Antibiotics:  Anti-infectives    Start     Dose/Rate Route Frequency Ordered Stop   11/12/14 1317  polymyxin B 500,000 Units, bacitracin 50,000 Units in sodium chloride irrigation 0.9 % 500 mL irrigation  Status:  Discontinued       As needed 11/12/14 1319 11/12/14 1417   11/09/14 1400  linezolid (ZYVOX) tablet 600 mg     600 mg Oral Every 12 hours 11/09/14 1319     11/09/14 0700  vancomycin (VANCOCIN) 500 mg in sodium chloride 0.9 % 100 mL IVPB  Status:  Discontinued     500 mg100 mL/hr over 60 Minutes Intravenous Every 12 hours 11/08/14 1732 11/08/14 1737   11/08/14 2200  vancomycin (VANCOCIN) IVPB 750 mg/150 ml premix  Status:  Discontinued     750 mg150 mL/hr over 60 Minutes Intravenous Every 24 hours 11/08/14 1737 11/09/14 1319   11/08/14 1800  meropenem (MERREM) 1 g in sodium chloride 0.9 % 100 mL IVPB     1 g200 mL/hr over 30 Minutes Intravenous Every 8 hours 11/08/14 1732     11/08/14 1745  vancomycin (VANCOCIN) 1,250 mg in sodium chloride 0.9 % 250 mL IVPB  Status:  Discontinued     1,250 mg166.7 mL/hr over 90 Minutes Intravenous  Once 11/08/14 1732 11/08/14 1734      Medications: Scheduled Meds: . sodium chloride   Intravenous Once  . atorvastatin  20 mg Oral q1800  . baclofen  10 mg Oral BID WC  . baclofen  20 mg Oral QHS  . feeding supplement (GLUCERNA SHAKE)  237 mL Oral TID BM  . feeding supplement (PRO-STAT SUGAR FREE 64)  30 mL Oral Daily  . fentaNYL  25 mcg Transdermal Q72H  . heparin  5,000 Units Subcutaneous 3 times per day  . insulin aspart  0-5 Units Subcutaneous QHS  . insulin aspart  0-9 Units Subcutaneous TID WC  . insulin aspart  4 Units Subcutaneous TID AC  . insulin glargine  25 Units Subcutaneous QHS  . linezolid  600 mg Oral Q12H  . loratadine  10 mg Oral Daily  . meropenem (MERREM) IV  1 g Intravenous Q8H  . pantoprazole  40 mg Oral Daily  . pregabalin  100 mg Oral TID AC  .  pregabalin  200 mg Oral QHS   Continuous Infusions:  PRN Meds:.bisacodyl, gi cocktail, guaiFENesin-dextromethorphan, metoCLOPramide, ondansetron **OR** ondansetron (ZOFRAN) IV, oxyCODONE-acetaminophen **AND** oxyCODONE, polyethylene glycol, sodium chloride, sodium chloride    Objective: Weight change:   Intake/Output Summary (Last 24 hours) at 11/15/14 1538 Last data filed at 11/15/14 0700  Gross per 24 hour  Intake    510 ml  Output   3450 ml  Net  -2940 ml   Blood pressure 102/72, pulse 92, temperature 100.4 F (38 C), temperature source Oral, resp. rate 19, height 5\' 8"  (1.727 m), weight 151 lb 10.8 oz (68.8 kg), SpO2 100 %. Temp:  [99.1 F (37.3 C)-100.4 F (38 C)] 100.4 F (38 C) (02/08 0651) Pulse Rate:  [86-94] 92 (02/08 0651) Resp:  [15-19] 19 (02/08 0651) BP: (95-124)/(68-83) 102/72 mmHg (02/08 0653) SpO2:  [98 %-100 %] 100 % (02/08 0651) Weight:  [151 lb 10.8 oz (68.8 kg)] 151 lb 10.8 oz (68.8 kg) (02/08 2952)  Physical Exam: General: Sleepy but arousable getting ready go to the operating room. Skin: wound vacuum in place, area where blistered skin has denuded is stable Neuro:  paraplegic   CBC:  CBC Latest Ref Rng 11/15/2014 11/14/2014 11/13/2014  WBC 4.0 - 10.5 K/uL 28.5(H) 23.1(H) 22.4(H)  Hemoglobin 13.0 - 17.0 g/dL 8.5(L) 8.1(L) 8.1(L)  Hematocrit 39.0 - 52.0 % 26.3(L) 24.8(L) 25.2(L)  Platelets 150 - 400 K/uL 582(H) 584(H) 582(H)       BMET  Recent Labs  11/14/14 0500 11/15/14 0225  NA 135 131*  K 4.9 5.5*  CL 109 104  CO2 24 23  GLUCOSE 145* 174*  BUN 11 11  CREATININE 0.74 0.75  CALCIUM 9.6 9.9     Liver Panel  No results for input(s): PROT, ALBUMIN, AST, ALT, ALKPHOS, BILITOT, BILIDIR, IBILI in the last 72 hours.     Sedimentation Rate No results for input(s): ESRSEDRATE in the last 72 hours. C-Reactive Protein No results for input(s): CRP in the last 72 hours.  Micro Results: Recent Results (from the past 720 hour(s))  Urine  culture     Status: None   Collection Time: 11/08/14  3:13 PM  Result Value Ref Range Status   Specimen Description URINE, CATHETERIZED  Final   Special Requests NONE  Final   Colony Count   Final    >=100,000 COLONIES/ML Performed at [place]   Final    BURKHOLDERIA CEPACIA Performed at [facility]    Report Status 11/11/2014 FINAL  Final   Organism ID, Bacteria BURKHOLDERIA CEPACIA  Final      Susceptibility   Burkholderia cepacia - MIC*    LEVOFLOXACIN 4 INTERMEDIATE Intermediate     TRIMETH/SULFA 160 RESISTANT Resistant     CEFTAZIDIME 8 SENSITIVE Sensitive     * BURKHOLDERIA CEPACIA  Culture, blood (routine x 2)     Status: None   Collection Time: 11/08/14  5:25 PM  Result Value Ref Range Status   Specimen Description BLOOD ARM LEFT  Final   Special Requests BOTTLES DRAWN AEROBIC AND ANAEROBIC 10CC  Final   Culture   Final    KLEBSIELLA PNEUMONIAE Note: Culture results may be compromised due to an excessive volume of blood received in culture bottles. Gram Stain Report Called to,Read Back By and Verified With: Juanetta Beets 11/12/14 0144A [place] Performed at [facility]    Report Status 11/14/2014 FINAL  Final   Organism ID, Bacteria KLEBSIELLA PNEUMONIAE  Final      Susceptibility   Klebsiella pneumoniae - MIC*    AMPICILLIN RESISTANT      AMPICILLIN/SULBACTAM 4 SENSITIVE Sensitive     CEFAZOLIN <=4 SENSITIVE Sensitive     CEFEPIME <=1 SENSITIVE Sensitive     CEFTAZIDIME <=1 SENSITIVE Sensitive     CEFTRIAXONE <=1 SENSITIVE Sensitive     CIPROFLOXACIN <=0.25 SENSITIVE Sensitive     GENTAMICIN <=1 SENSITIVE Sensitive     IMIPENEM <=0.25 SENSITIVE Sensitive     PIP/TAZO <=4 SENSITIVE Sensitive     TOBRAMYCIN <=1 SENSITIVE Sensitive     TRIMETH/SULFA <=20 SENSITIVE Sensitive     * KLEBSIELLA PNEUMONIAE  Culture, blood (routine x 2)     Status: None   Collection Time: 11/08/14  5:35 PM  Result Value Ref Range Status    Specimen Description BLOOD ARM LEFT  Final   Special Requests BOTTLES DRAWN AEROBIC AND ANAEROBIC 10CC  Final   Culture   Final    KLEBSIELLA PNEUMONIAE Note: SUSCEPTIBILITIES PERFORMED ON PREVIOUS CULTURE WITHIN THE LAST 5 DAYS. Gram Stain Report Called to,Read Back By and Verified With: Iran Sizer RN @  [place] 11/12/14 Performed at [facility]    Report Status 11/14/2014 FINAL  Final  Clostridium Difficile by PCR     Status: None   Collection Time: 11/12/14  8:41 AM  Result Value Ref Range Status   C difficile by pcr NEGATIVE NEGATIVE Final  Culture, blood (routine x 2)     Status: None (Preliminary result)   Collection Time: 11/12/14  8:04 PM  Result Value Ref Range Status   Specimen Description BLOOD LEFT ARM  Final   Special Requests BOTTLES DRAWN AEROBIC AND ANAEROBIC 10CC  Final   Culture   Final           BLOOD CULTURE RECEIVED NO GROWTH TO DATE CULTURE WILL BE HELD FOR 5 DAYS BEFORE ISSUING A FINAL NEGATIVE REPORT Note: Culture results may be compromised due to an excessive volume of blood received in culture bottles. Performed at [facility]    Report Status PENDING  Incomplete  Culture, blood (routine x 2)     Status: None (Preliminary result)   Collection Time: 11/12/14  8:19 PM  Result Value Ref Range Status   Specimen Description BLOOD LEFT ARM  Final   Special Requests   Final    BOTTLES DRAWN AEROBIC AND ANAEROBIC BLUE 10CC RED 5CC   Culture   Final           BLOOD CULTURE RECEIVED NO GROWTH TO DATE CULTURE WILL BE HELD FOR 5 DAYS BEFORE ISSUING A FINAL NEGATIVE REPORT Performed at [facility]    Report Status PENDING  Incomplete    Studies/Results: Dg Chest Port 1 View  11/15/2014   CLINICAL DATA:  Subsequent encounter for pneumonia.  EXAM: PORTABLE CHEST - 1 VIEW  COMPARISON:  11/08/2014  FINDINGS: The right PICC line is stable. The tip is near the cavoatrial junction. The cardiac silhouette, mediastinal and hilar contours are within  normal limits and stable. Persistent left lower lobe process, likely pneumonia and small parapneumonic effusion. The right lung remains clear.  IMPRESSION: Persistent left lower lobe process likely a combination of infiltrate and effusion.   Electronically Signed   By: Kalman Jewels M.D.   On: 11/15/2014 12:47   Dg Fluoro Guide Ndl Plc/bx  11/15/2014   CLINICAL DATA:  RIGHT HIP ASPIRATION INFECTION. PATIENT HAS DECUBITUS ULCERS.  EXAM: RIGHT HIP ASPIRATION UNDER FLUOROSCOPY  FLUOROSCOPY TIME:  If the device does not provide the exposure index:  Fluoroscopy Time (in minutes and seconds):  7 SECONDS  Number of Acquired Images:  0  PROCEDURE: Overlying skin prepped with Betadine, draped in the usual sterile fashion, and infiltrated locally with buffered Lidocaine. Curved 22 gauge spinal needle advanced to the superolateral margin of the RIGHT femoral head. Diagnostic injection of iodinated contrast demonstrates intra-articular spread without intravascular component.  Aspiration was attempted with no return of fluid. Lavage of the right hip joint was performed with 5 mL of sterile saline with subsequent aspiration of 1 mL of saline from the right hip joint. The spinal needle was removed.  The patient tolerated the procedure well. There were no immediate complications. Hemostasis was achieved.  IMPRESSION: Technically successful right hip aspiration under fluoroscopy. 1 mL of clear right hip joint aspirate was sent for laboratory analysis.   Electronically Signed   By: Kathreen Devoid   On: 11/15/2014 11:34      Assessment/Plan:  Active Problems:   Type 1 diabetes, uncontrolled, with neuropathy   GERD   Gastroparesis due to DM  Protein-calorie malnutrition, severe   Spinal cord infarction (history of)   Tetraplegia   Neurogenic bladder   Sepsis   Severe sepsis   Stage IV decubitus ulcer   Osteomyelitis   Osteomyelitis, pelvis   AKI (acute kidney injury)   Decubitus ulcer   Osteomyelitis, pelvic  region and thigh   Septic hip   Burkholderia cepacia infection   Urinary tract infectious disease   Suprapubic catheter   Decubitus ulcer of ischial area   Sacral decubitus ulcer   Gram-negative bacteremia    Jason [name] is a 29 y.o. male with Paraplegia, prior MSSA bacteremia, decubitus ulcers, pelvic osteomyelitis sp I and D by Dr Grandville Silos with bone cultures sent in December on vancomycin which grew multiple organisms but no staph or strep. Patient went home on IV vancomycin alone.  In the interim he appears to have developed a blistering of his skin which occurred while his wound had packing material in place for protracted amount of time due to the fact that he did not have a home health nurse to come visit him due to the winter weather. Since that is yet also had worsening foul-smelling drainage from his midline large decubitus ulcer that appears worse on exam today than when I saw him last. MRI done today shows ischial osteomyelitis on the right that is stable on exam, nonenhancing fluid collections withinthe right adductor magnus and adductor brevis muscles extending fromthe right ischial tuberosity consistent with intramuscular Abscesses, Small right joint effusion with mild synovial enhancement, ulcer of the left ischial tuberosity without underlying bone abnormalities, edema of the pelvic musculature.    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 Migdalia Dk taking patient to the OR today surgery and obtaining deep cultures. Also Greatly appreciate Dr. Marlou Sa from Orthopedics seeing the patient  --followup OPERATIVE CULTURES  --ASPIRATE BY FLUORO OF HIP  Only 4 WBC  --will change to IV vancomycin with merrem  #2  Klebsiella PNA bacteremia  22 blood cultures:  Need to DC PICC Get peripheral IV  Holiday from central line  #3 Burkholderia cepacia UTI:   This is a MDR pathogen typically and NOT typically found in urine but  rather in the lungs of CF patients, Polly immunocompromised patients. I did find right report of an outbreak associated with lubricating jelly was used to insert catheters.  Suprapubic catheter has been exchanged    #4 Blistering rash; I had concern this could have been due to Staph Toxic shock syndrome due to packing in the wound and therefore exchanged his vancomycin for zyvox for toxin inhibition, more simple explanation Plastics suggested this could be burn from a heating pad I will go back to vancomycin  #5 Diabetic foot: still need to look at this more closely         LOS: 7 days   [name] 11/15/2014, 3:38 PM

## 2014-11-16 DIAGNOSIS — L98499 Non-pressure chronic ulcer of skin of other sites with unspecified severity: Secondary | ICD-10-CM | POA: Insufficient documentation

## 2014-11-16 DIAGNOSIS — A4101 Sepsis due to Methicillin susceptible Staphylococcus aureus: Secondary | ICD-10-CM

## 2014-11-16 DIAGNOSIS — L97529 Non-pressure chronic ulcer of other part of left foot with unspecified severity: Secondary | ICD-10-CM

## 2014-11-16 DIAGNOSIS — B961 Klebsiella pneumoniae [K. pneumoniae] as the cause of diseases classified elsewhere: Secondary | ICD-10-CM

## 2014-11-16 DIAGNOSIS — J690 Pneumonitis due to inhalation of food and vomit: Secondary | ICD-10-CM | POA: Insufficient documentation

## 2014-11-16 DIAGNOSIS — E08621 Diabetes mellitus due to underlying condition with foot ulcer: Secondary | ICD-10-CM | POA: Insufficient documentation

## 2014-11-16 DIAGNOSIS — J90 Pleural effusion, not elsewhere classified: Secondary | ICD-10-CM | POA: Insufficient documentation

## 2014-11-16 LAB — CBC
HEMATOCRIT: 25.4 % — AB (ref 39.0–52.0)
Hemoglobin: 8.2 g/dL — ABNORMAL LOW (ref 13.0–17.0)
MCH: 26.7 pg (ref 26.0–34.0)
MCHC: 32.3 g/dL (ref 30.0–36.0)
MCV: 82.7 fL (ref 78.0–100.0)
Platelets: 520 10*3/uL — ABNORMAL HIGH (ref 150–400)
RBC: 3.07 MIL/uL — ABNORMAL LOW (ref 4.22–5.81)
RDW: 16.5 % — AB (ref 11.5–15.5)
WBC: 23.1 10*3/uL — AB (ref 4.0–10.5)

## 2014-11-16 LAB — GLUCOSE, CAPILLARY
GLUCOSE-CAPILLARY: 120 mg/dL — AB (ref 70–99)
GLUCOSE-CAPILLARY: 261 mg/dL — AB (ref 70–99)
Glucose-Capillary: 88 mg/dL (ref 70–99)
Glucose-Capillary: 152 mg/dL — ABNORMAL HIGH (ref 70–99)
Glucose-Capillary: 182 mg/dL — ABNORMAL HIGH (ref 70–99)

## 2014-11-16 MED ORDER — ZOLPIDEM TARTRATE 5 MG PO TABS
5.0000 mg | ORAL_TABLET | Freq: Once | ORAL | Status: AC
  Administered 2014-11-16: 5 mg via ORAL
  Filled 2014-11-16: qty 1

## 2014-11-16 MED ORDER — VANCOMYCIN HCL IN DEXTROSE 750-5 MG/150ML-% IV SOLN
750.0000 mg | Freq: Two times a day (BID) | INTRAVENOUS | Status: DC
  Administered 2014-11-16 – 2014-11-18 (×5): 750 mg via INTRAVENOUS
  Filled 2014-11-16 (×6): qty 150

## 2014-11-16 NOTE — Progress Notes (Signed)
ANTIBIOTIC CONSULT NOTE - INITIAL  Pharmacy Consult for vancomycin Indication: Decubitus ulcers pelvic osteomyelitis with intrramuscular abscesses  Patient Measurements: Height: 5\' 8"  (172.7 cm) Weight: 150 lb (68.04 kg) IBW/kg (Calculated) : 68.4  Vital Signs: Temp: 98.8 F (37.1 C) (02/09 0651) Temp Source: Oral (02/09 0651) BP: 109/68 mmHg (02/09 0651) Pulse Rate: 92 (02/09 0651) Intake/Output from previous day: 02/08 0701 - 02/09 0700 In: 2308.7 [P.O.:837; I.V.:871.7; IV Piggyback:600] Out: 3646 [Urine:2000; Drains:300; Stool:2250] Intake/Output from this shift:    Labs:  Recent Labs  11/13/14 1930 11/14/14 0500 11/15/14 0225 11/15/14 1917  WBC 22.4* 23.1* 28.5*  --   HGB 8.1* 8.1* 8.5*  --   PLT 582* 584* 582*  --   CREATININE  --  0.74 0.75 0.83   Estimated Creatinine Clearance: 125.2 mL/min (by C-G formula based on Cr of 0.83). No results for input(s): VANCOTROUGH, VANCOPEAK, VANCORANDOM, GENTTROUGH, GENTPEAK, GENTRANDOM, TOBRATROUGH, TOBRAPEAK, TOBRARND, AMIKACINPEAK, AMIKACINTROU, AMIKACIN in the last 72 hours.   Microbiology: Recent Results (from the past 720 hour(s))  Urine culture     Status: None   Collection Time: 11/08/14  3:13 PM  Result Value Ref Range Status   Specimen Description URINE, CATHETERIZED  Final   Special Requests NONE  Final   Colony Count   Final    >=100,000 COLONIES/ML Performed at [place]   Final    BURKHOLDERIA CEPACIA Performed at [facility]    Report Status 11/11/2014 FINAL  Final   Organism ID, Bacteria BURKHOLDERIA CEPACIA  Final      Susceptibility   Burkholderia cepacia - MIC*    LEVOFLOXACIN 4 INTERMEDIATE Intermediate     TRIMETH/SULFA 160 RESISTANT Resistant     CEFTAZIDIME 8 SENSITIVE Sensitive     * BURKHOLDERIA CEPACIA  Culture, blood (routine x 2)     Status: None   Collection Time: 11/08/14  5:25 PM  Result Value Ref Range Status   Specimen Description BLOOD ARM LEFT   Final   Special Requests BOTTLES DRAWN AEROBIC AND ANAEROBIC 10CC  Final   Culture   Final    KLEBSIELLA PNEUMONIAE Note: Culture results may be compromised due to an excessive volume of blood received in culture bottles. Gram Stain Report Called to,Read Back By and Verified With: Juanetta Beets 11/12/14 0144A [place] Performed at [facility]    Report Status 11/14/2014 FINAL  Final   Organism ID, Bacteria KLEBSIELLA PNEUMONIAE  Final      Susceptibility   Klebsiella pneumoniae - MIC*    AMPICILLIN RESISTANT      AMPICILLIN/SULBACTAM 4 SENSITIVE Sensitive     CEFAZOLIN <=4 SENSITIVE Sensitive     CEFEPIME <=1 SENSITIVE Sensitive     CEFTAZIDIME <=1 SENSITIVE Sensitive     CEFTRIAXONE <=1 SENSITIVE Sensitive     CIPROFLOXACIN <=0.25 SENSITIVE Sensitive     GENTAMICIN <=1 SENSITIVE Sensitive     IMIPENEM <=0.25 SENSITIVE Sensitive     PIP/TAZO <=4 SENSITIVE Sensitive     TOBRAMYCIN <=1 SENSITIVE Sensitive     TRIMETH/SULFA <=20 SENSITIVE Sensitive     * KLEBSIELLA PNEUMONIAE  Culture, blood (routine x 2)     Status: None   Collection Time: 11/08/14  5:35 PM  Result Value Ref Range Status   Specimen Description BLOOD ARM LEFT  Final   Special Requests BOTTLES DRAWN AEROBIC AND ANAEROBIC 10CC  Final   Culture   Final    KLEBSIELLA PNEUMONIAE Note: SUSCEPTIBILITIES PERFORMED ON PREVIOUS  CULTURE WITHIN THE LAST 5 DAYS. Gram Stain Report Called to,Read Back By and Verified With: Iran Sizer RN @ [id number] [facility] 11/12/14 Performed at [facility]    Report Status 11/14/2014 FINAL  Final  Clostridium Difficile by PCR     Status: None   Collection Time: 11/12/14  8:41 AM  Result Value Ref Range Status   C difficile by pcr NEGATIVE NEGATIVE Final  Culture, blood (routine x 2)     Status: None (Preliminary result)   Collection Time: 11/12/14  8:04 PM  Result Value Ref Range Status   Specimen Description BLOOD LEFT ARM  Final   Special Requests BOTTLES DRAWN AEROBIC AND ANAEROBIC  10CC  Final   Culture   Final           BLOOD CULTURE RECEIVED NO GROWTH TO DATE CULTURE WILL BE HELD FOR 5 DAYS BEFORE ISSUING A FINAL NEGATIVE REPORT Note: Culture results may be compromised due to an excessive volume of blood received in culture bottles. Performed at [facility]    Report Status PENDING  Incomplete  Culture, blood (routine x 2)     Status: None (Preliminary result)   Collection Time: 11/12/14  8:19 PM  Result Value Ref Range Status   Specimen Description BLOOD LEFT ARM  Final   Special Requests   Final    BOTTLES DRAWN AEROBIC AND ANAEROBIC BLUE 10CC RED 5CC   Culture   Final           BLOOD CULTURE RECEIVED NO GROWTH TO DATE CULTURE WILL BE HELD FOR 5 DAYS BEFORE ISSUING A FINAL NEGATIVE REPORT Performed at [facility]    Report Status PENDING  Incomplete    Assessment: 29 yo male with decubitus ulcers pelvic osteomyelitis with intrramuscular abscesses will be changed from linezolid to vancomycin.  Dosing last month was variable due to changes in renal function.  Anticipate a long length of therapy.  Merrem also continues per ID service.  Goal of Therapy:  Vancomycin trough level 15-20 mcg/ml  Plan:  - vancomycin 750 mg iv q12h - check vancomycin trough at steady state - monitor renal function closely  Candie Mile 11/16/2014,9:57 AM

## 2014-11-16 NOTE — Progress Notes (Signed)
Hip aspirate on right likely not infection based on wbccount from aspirate

## 2014-11-16 NOTE — Progress Notes (Signed)
Thank you for consult on [name]. He's well known to CIR from prior stays/consults. Chart reviewed and note that patient is showing improvement in alertness but continues to have limited activity tolerance. He is limited to a hour at time for activity OOB due to sacral wound and  need pressure relief measures and wound healing.  Doubt that would be able to participate with intensive therapy on CIR. He has a supportive family and would recommend HHPT v/s LTAC for follow up therapy past discharge.

## 2014-11-16 NOTE — Progress Notes (Signed)
NUTRITION FOLLOW UP  Intervention:   Continue Glucerna Shake po TID, each supplement provides 220 kcal and 10 grams of protein  Continue Prostat daily  Nutrition Dx:   Increased nutrient needs related to wounds as evidenced by estimated needs; ongoing  Goal:   Pt to meet >/= 90% of their estimated nutrition needs; goal met  Monitor:   PO intake, supplement acceptance, diet compliance  Assessment:   Pt with sacral wound, infection, osteomyelitis, abscesses, pt had been on IV vanc x 3 weeks. 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  ID following for abx changes. S/P debridement 2/5 with VAC.   Spoke with RN who reports that pt has a good appetite. However, RN reports non-compliance with diet restrictions ("he eats whatever he wants"). RN confirms that pt takes supplements well.  Pt drowsy at time of visit. Hx obtained by pt mother at bedside. She reports pt is frustrated with diet restrictions, as he is not always able to eat whatever he wants. She has a good understanding of diabetic diet principles and rationale for his diet. She confirms pt's good appetite. She reports that pt enjoys having a salad at each meal, however, is concerned that pt requests multiple salads and fruits. Additionally, she reveals that pt likes brown sugar in his oatmeal, but is amenable to adding additional packs of splenda to tray. RN notified nutritional services staff of additional preferences.  Reviewed rationale for diet principles as well as importance of good PO intake to promote healing.  No signs of fat or muscle depletion noted. Noted progression of wt gain.  Labs reviewed. Na: 130, K: 5.3, Glucose: 153, CBGS: 86-193.   Height: Ht Readings from Last 1 Encounters:  11/08/14 5' 8"  (1.727 m)    Weight Status:   Wt Readings from Last 1 Encounters:  11/16/14 150 lb (68.04 kg)    Re-estimated needs:  Kcal: 2100-2300 Protein: 104-114 grams Fluid: 2.1-2.3 L  Skin: stage II finger, stage  II buttocks, stage III heel, stage IV buttocks, stage IV ishial tuberosity, incisions  Diet Order: Diet Carb Modified   Intake/Output Summary (Last 24 hours) at 11/16/14 0936 Last data filed at 11/16/14 0511  Gross per 24 hour  Intake 2308.67 ml  Output   4550 ml  Net -2241.33 ml    Last BM: 11/16/14   Labs:   Recent Labs Lab 11/14/14 0500 11/15/14 0225 11/15/14 1917  NA 135 131* 130*  K 4.9 5.5* 5.3*  CL 109 104 104  CO2 24 23 24   BUN 11 11 14   CREATININE 0.74 0.75 0.83  CALCIUM 9.6 9.9 10.0  GLUCOSE 145* 174* 153*    CBG (last 3)   Recent Labs  11/14/14 1651 11/14/14 2202 11/15/14 0743  GLUCAP 86 193* 148*    Scheduled Meds: . sodium chloride   Intravenous Once  . atorvastatin  20 mg Oral q1800  . baclofen  10 mg Oral BID WC  . baclofen  20 mg Oral QHS  . feeding supplement (GLUCERNA SHAKE)  237 mL Oral TID BM  . feeding supplement (PRO-STAT SUGAR FREE 64)  30 mL Oral Daily  . fentaNYL  25 mcg Transdermal Q72H  . heparin  5,000 Units Subcutaneous 3 times per day  . insulin aspart  0-5 Units Subcutaneous QHS  . insulin aspart  0-9 Units Subcutaneous TID WC  . insulin aspart  4 Units Subcutaneous TID AC  . insulin glargine  25 Units Subcutaneous QHS  .  loratadine  10 mg Oral Daily  . meropenem (MERREM) IV  1 g Intravenous Q8H  . pantoprazole  40 mg Oral Daily  . pregabalin  100 mg Oral TID AC  . pregabalin  200 mg Oral QHS  . vancomycin  750 mg Intravenous Q8H    Continuous Infusions: . sodium chloride 100 mL/hr at 11/16/14 0921    Lesette Frary [name], RD, LDN, CDE Pager: [phone] After hours Pager: [phone]

## 2014-11-16 NOTE — Progress Notes (Signed)
Inpatient Rehabilitation  I spoke with pt. and his mother regarding recommendation of HH vs. LTACH following his acute hospitalization, per Algis Liming, PA with rehab.  I explained that pt. has limitations in being able to sit due to wound and healing needs, pt not able to participate in intensive rehab.  Mom is concerned that pt. Is "losing his skills" while spending most of time in bed.  I reiterated that PT would address her concerns .  I will sign off.  Please call if questions.  [facility] Admissions Coordinator Cell [phone] Office [phone]

## 2014-11-16 NOTE — Progress Notes (Addendum)
[facility]    Subjective: Slept again thru most of my visit in which Mom talked to me fairly extensively  Antibiotics:  Anti-infectives    Start     Dose/Rate Route Frequency Ordered Stop   11/16/14 1000  vancomycin (VANCOCIN) IVPB 750 mg/150 ml premix     750 mg150 mL/hr over 60 Minutes Intravenous Every 12 hours 11/16/14 0957     11/15/14 1700  vancomycin (VANCOCIN) IVPB 750 mg/150 ml premix  Status:  Discontinued     750 mg150 mL/hr over 60 Minutes Intravenous Every 8 hours 11/15/14 1610 11/16/14 0957   11/12/14 1317  polymyxin B 500,000 Units, bacitracin 50,000 Units in sodium chloride irrigation 0.9 % 500 mL irrigation  Status:  Discontinued       As needed 11/12/14 1319 11/12/14 1417   11/09/14 1400  linezolid (ZYVOX) tablet 600 mg  Status:  Discontinued     600 mg Oral Every 12 hours 11/09/14 1319 11/15/14 1542   11/09/14 0700  vancomycin (VANCOCIN) 500 mg in sodium chloride 0.9 % 100 mL IVPB  Status:  Discontinued     500 mg100 mL/hr over 60 Minutes Intravenous Every 12 hours 11/08/14 1732 11/08/14 1737   11/08/14 2200  vancomycin (VANCOCIN) IVPB 750 mg/150 ml premix  Status:  Discontinued     750 mg150 mL/hr over 60 Minutes Intravenous Every 24 hours 11/08/14 1737 11/09/14 1319   11/08/14 1800  meropenem (MERREM) 1 g in sodium chloride 0.9 % 100 mL IVPB     1 g200 mL/hr over 30 Minutes Intravenous Every 8 hours 11/08/14 1732     11/08/14 1745  vancomycin (VANCOCIN) 1,250 mg in sodium chloride 0.9 % 250 mL IVPB  Status:  Discontinued     1,250 mg166.7 mL/hr over 90 Minutes Intravenous  Once 11/08/14 1732 11/08/14 1734      Medications: Scheduled Meds: . sodium chloride   Intravenous Once  . atorvastatin  20 mg Oral q1800  . baclofen  10 mg Oral BID WC  . baclofen  20 mg Oral QHS  . feeding supplement (GLUCERNA SHAKE)  237 mL Oral TID BM  . feeding supplement (PRO-STAT SUGAR FREE 64)  30 mL Oral Daily  . fentaNYL  25 mcg Transdermal Q72H  .  heparin  5,000 Units Subcutaneous 3 times per day  . insulin aspart  0-5 Units Subcutaneous QHS  . insulin aspart  0-9 Units Subcutaneous TID WC  . insulin aspart  4 Units Subcutaneous TID AC  . insulin glargine  25 Units Subcutaneous QHS  . loratadine  10 mg Oral Daily  . meropenem (MERREM) IV  1 g Intravenous Q8H  . pantoprazole  40 mg Oral Daily  . pregabalin  100 mg Oral TID AC  . pregabalin  200 mg Oral QHS  . vancomycin  750 mg Intravenous Q12H   Continuous Infusions: . sodium chloride 100 mL/hr at 11/16/14 0921   PRN Meds:.bisacodyl, gi cocktail, guaiFENesin-dextromethorphan, metoCLOPramide, ondansetron **OR** ondansetron (ZOFRAN) IV, oxyCODONE-acetaminophen **AND** oxyCODONE, polyethylene glycol, sodium chloride, sodium chloride    Objective: Weight change: -8 lb 1.1 oz (-3.661 kg)  Intake/Output Summary (Last 24 hours) at 11/16/14 2003 Last data filed at 11/16/14 1910  Gross per 24 hour  Intake 3415.34 ml  Output   4350 ml  Net -934.66 ml   Blood pressure 114/78, pulse 87, temperature 98.3 F (36.8 C), temperature source Oral, resp. rate 18, height 5\' 8"  (1.727 m), weight 150 lb (68.04 kg),  SpO2 100 %. Temp:  [98.3 F (36.8 C)-98.8 F (37.1 C)] 98.3 F (36.8 C) (02/09 1450) Pulse Rate:  [87-92] 87 (02/09 1450) Resp:  [18] 18 (02/09 1450) BP: (109-114)/(68-78) 114/78 mmHg (02/09 1450) SpO2:  [100 %] 100 % (02/09 0651) Weight:  [150 lb (68.04 kg)] 150 lb (68.04 kg) (02/09 0500)  Physical Exam: General: Sleepy but arousable  CV: rr Pulm: fairly clear. Skin:   Left foot   09/29/2014:      11/16/14:           Right foot      11/16/14      Left hand where he had blister per Mom in November with exposed tendon:         Neuro: paraplegic   CBC:  CBC Latest Ref Rng 11/16/2014 11/15/2014 11/14/2014  WBC 4.0 - 10.5 K/uL 23.1(H) 28.5(H) 23.1(H)  Hemoglobin 13.0 - 17.0 g/dL 8.2(L) 8.5(L) 8.1(L)  Hematocrit 39.0 - 52.0 % 25.4(L) 26.3(L)  24.8(L)  Platelets 150 - 400 K/uL 520(H) 582(H) 584(H)       BMET  Recent Labs  11/15/14 0225 11/15/14 1917  NA 131* 130*  K 5.5* 5.3*  CL 104 104  CO2 23 24  GLUCOSE 174* 153*  BUN 11 14  CREATININE 0.75 0.83  CALCIUM 9.9 10.0     Liver Panel  No results for input(s): PROT, ALBUMIN, AST, ALT, ALKPHOS, BILITOT, BILIDIR, IBILI in the last 72 hours.     Sedimentation Rate No results for input(s): ESRSEDRATE in the last 72 hours. C-Reactive Protein No results for input(s): CRP in the last 72 hours.  Micro Results: Recent Results (from the past 720 hour(s))  Urine culture     Status: None   Collection Time: 11/08/14  3:13 PM  Result Value Ref Range Status   Specimen Description URINE, CATHETERIZED  Final   Special Requests NONE  Final   Colony Count   Final    >=100,000 COLONIES/ML Performed at [facility]   Final    BURKHOLDERIA CEPACIA Performed at [facility]    Report Status 11/11/2014 FINAL  Final   Organism ID, Bacteria BURKHOLDERIA CEPACIA  Final      Susceptibility   Burkholderia cepacia - MIC*    LEVOFLOXACIN 4 INTERMEDIATE Intermediate     TRIMETH/SULFA 160 RESISTANT Resistant     CEFTAZIDIME 8 SENSITIVE Sensitive     * BURKHOLDERIA CEPACIA  Culture, blood (routine x 2)     Status: None   Collection Time: 11/08/14  5:25 PM  Result Value Ref Range Status   Specimen Description BLOOD ARM LEFT  Final   Special Requests BOTTLES DRAWN AEROBIC AND ANAEROBIC 10CC  Final   Culture   Final    KLEBSIELLA PNEUMONIAE Note: Culture results may be compromised due to an excessive volume of blood received in culture bottles. Gram Stain Report Called to,Read Back By and Verified With: Juanetta Beets 11/12/14 0144A [place] Performed at [facility]    Report Status 11/14/2014 FINAL  Final   Organism ID, Bacteria KLEBSIELLA PNEUMONIAE  Final      Susceptibility   Klebsiella pneumoniae - MIC*    AMPICILLIN RESISTANT       AMPICILLIN/SULBACTAM 4 SENSITIVE Sensitive     CEFAZOLIN <=4 SENSITIVE Sensitive     CEFEPIME <=1 SENSITIVE Sensitive     CEFTAZIDIME <=1 SENSITIVE Sensitive     CEFTRIAXONE <=1 SENSITIVE Sensitive     CIPROFLOXACIN <=0.25 SENSITIVE Sensitive  GENTAMICIN <=1 SENSITIVE Sensitive     IMIPENEM <=0.25 SENSITIVE Sensitive     PIP/TAZO <=4 SENSITIVE Sensitive     TOBRAMYCIN <=1 SENSITIVE Sensitive     TRIMETH/SULFA <=20 SENSITIVE Sensitive     * KLEBSIELLA PNEUMONIAE  Culture, blood (routine x 2)     Status: None   Collection Time: 11/08/14  5:35 PM  Result Value Ref Range Status   Specimen Description BLOOD ARM LEFT  Final   Special Requests BOTTLES DRAWN AEROBIC AND ANAEROBIC 10CC  Final   Culture   Final    KLEBSIELLA PNEUMONIAE Note: SUSCEPTIBILITIES PERFORMED ON PREVIOUS CULTURE WITHIN THE LAST 5 DAYS. Gram Stain Report Called to,Read Back By and Verified With: Iran Sizer RN @ [address]FI [facility] 11/12/14 Performed at [facility]    Report Status 11/14/2014 FINAL  Final  Clostridium Difficile by PCR     Status: None   Collection Time: 11/12/14  8:41 AM  Result Value Ref Range Status   C difficile by pcr NEGATIVE NEGATIVE Final  Culture, blood (routine x 2)     Status: None (Preliminary result)   Collection Time: 11/12/14  8:04 PM  Result Value Ref Range Status   Specimen Description BLOOD LEFT ARM  Final   Special Requests BOTTLES DRAWN AEROBIC AND ANAEROBIC 10CC  Final   Culture   Final           BLOOD CULTURE RECEIVED NO GROWTH TO DATE CULTURE WILL BE HELD FOR 5 DAYS BEFORE ISSUING A FINAL NEGATIVE REPORT Note: Culture results may be compromised due to an excessive volume of blood received in culture bottles. Performed at [facility]    Report Status PENDING  Incomplete  Culture, blood (routine x 2)     Status: None (Preliminary result)   Collection Time: 11/12/14  8:19 PM  Result Value Ref Range Status   Specimen Description BLOOD LEFT ARM  Final   Special  Requests   Final    BOTTLES DRAWN AEROBIC AND ANAEROBIC BLUE 10CC RED 5CC   Culture   Final           BLOOD CULTURE RECEIVED NO GROWTH TO DATE CULTURE WILL BE HELD FOR 5 DAYS BEFORE ISSUING A FINAL NEGATIVE REPORT Performed at [facility]    Report Status PENDING  Incomplete  Body fluid culture     Status: None (Preliminary result)   Collection Time: 11/15/14 11:03 AM  Result Value Ref Range Status   Specimen Description FLUID HIP RIGHT SYNOVIAL  Final   Special Requests NONE  Final   Gram Stain   Final    RARE WBC PRESENT, PREDOMINANTLY MONONUCLEAR NO ORGANISMS SEEN Performed at [facility]    Culture PENDING  Incomplete   Report Status PENDING  Incomplete    Studies/Results: Ct Chest W Contrast  11/15/2014   CLINICAL DATA:  Pleural effusion. Left lower lobe opacity on chest radiograph. Increasing white count on appropriate antibiotics.  EXAM: CT CHEST WITH CONTRAST  TECHNIQUE: Multidetector CT imaging of the chest was performed during intravenous contrast administration.  CONTRAST:  1mL OMNIPAQUE IOHEXOL 300 MG/ML  SOLN  COMPARISON:  Chest 11/15/2014.  FINDINGS: Focal areas of consolidation in both lung bases, greater on the left, with small left pleural effusion. No evidence of loculated fluid to suggest empyema. Air bronchograms are present with suggestion of mild bronchiectasis. Findings likely represent multifocal pneumonia. No pneumothorax.  Normal heart size. Normal caliber thoracic aorta. Great vessel origins are patent. No significant  lymphadenopathy in the chest. Esophagus is mildly dilated with fluid and debris demonstrated throughout the esophagus. This may be due to reflux or dysmotility.  Included portions of the upper abdominal organs are grossly unremarkable. No destructive bone lesions.  IMPRESSION: Focal areas of consolidation in both lung bases, greater on the left. Small left pleural effusion. Findings likely to represent pneumonia. No evidence of  empyema. Mildly dilated esophagus filled with fluid and debris. This could be due to reflux or dysmotility.   Electronically Signed   By: Lucienne Capers M.D.   On: 11/15/2014 22:08   Dg Chest Port 1 View  11/15/2014   CLINICAL DATA:  Subsequent encounter for pneumonia.  EXAM: PORTABLE CHEST - 1 VIEW  COMPARISON:  11/08/2014  FINDINGS: The right PICC line is stable. The tip is near the cavoatrial junction. The cardiac silhouette, mediastinal and hilar contours are within normal limits and stable. Persistent left lower lobe process, likely pneumonia and small parapneumonic effusion. The right lung remains clear.  IMPRESSION: Persistent left lower lobe process likely a combination of infiltrate and effusion.   Electronically Signed   By: Kalman Jewels M.D.   On: 11/15/2014 12:47   Dg Fluoro Guide Ndl Plc/bx  11/15/2014   CLINICAL DATA:  RIGHT HIP ASPIRATION INFECTION. PATIENT HAS DECUBITUS ULCERS.  EXAM: RIGHT HIP ASPIRATION UNDER FLUOROSCOPY  FLUOROSCOPY TIME:  If the device does not provide the exposure index:  Fluoroscopy Time (in minutes and seconds):  7 SECONDS  Number of Acquired Images:  0  PROCEDURE: Overlying skin prepped with Betadine, draped in the usual sterile fashion, and infiltrated locally with buffered Lidocaine. Curved 22 gauge spinal needle advanced to the superolateral margin of the RIGHT femoral head. Diagnostic injection of iodinated contrast demonstrates intra-articular spread without intravascular component.  Aspiration was attempted with no return of fluid. Lavage of the right hip joint was performed with 5 mL of sterile saline with subsequent aspiration of 1 mL of saline from the right hip joint. The spinal needle was removed.  The patient tolerated the procedure well. There were no immediate complications. Hemostasis was achieved.  IMPRESSION: Technically successful right hip aspiration under fluoroscopy. 1 mL of clear right hip joint aspirate was sent for laboratory analysis.    Electronically Signed   By: Kathreen Devoid   On: 11/15/2014 11:34      Assessment/Plan:  Active Problems:   Type 1 diabetes, uncontrolled, with neuropathy   GERD   Gastroparesis due to DM   Protein-calorie malnutrition, severe   Spinal cord infarction (history of)   Tetraplegia   Neurogenic bladder   Sepsis   Severe sepsis   Stage IV decubitus ulcer   Osteomyelitis   Osteomyelitis, pelvis   AKI (acute kidney injury)   Decubitus ulcer   Osteomyelitis, pelvic region and thigh   Septic hip   Burkholderia cepacia infection   Urinary tract infectious disease   Suprapubic catheter   Decubitus ulcer of ischial area   Sacral decubitus ulcer   Gram-negative bacteremia   Right hip joint effusion   Klebsiella pneumoniae infection    Jason [name] is a 29 y.o. male with Paraplegia, prior MSSA bacteremia, decubitus ulcers, pelvic osteomyelitis sp I and D by Dr Grandville Silos with bone cultures sent in December on vancomycin which grew multiple organisms but no staph or strep. Patient went home on IV vancomycin alone.  In the interim he appeared to have developed a blistering of his skin which occurred while his wound had packing  material in place for protracted amount of time due to the fact that he did not have a home health nurse to come visit him due to the winter weather. Since that is yet also had worsening foul-smelling drainage from his midline large decubitus ulcer that appears worse on exam today than when I saw him last. MRI done today shows ischial osteomyelitis on the right that is stable on exam, nonenhancing fluid collections withinthe right adductor magnus and adductor brevis muscles extending fromthe right ischial tuberosity consistent with intramuscular Abscesses, Small right joint effusion with mild synovial enhancement, ulcer of the left ischial tuberosity without underlying bone abnormalities, edema of the pelvic musculature.    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 Migdalia Dk took patient to the OR  For surgery and obtained deep cultures. Also Greatly appreciate Dr. Marlou Sa from Orthopedics seeing the patient  --followup OPERATIVE CULTURES  --ASPIRATE BY FLUORO OF HIP  Only 4 WBC  --continue  IV vancomycin with merrem  #2  Klebsiella PNA bacteremia  22 blood cultures:  DC'd PICC peripheral IV Will repeat blood cultures today  Holiday from central line  Do not place new one until NGTD on blood cultures from today x 3 days  #3 Burkholderia cepacia UTI:   This is a MDR pathogen typically and NOT typically found in urine but rather in the lungs of CF patients, Polly immunocompromised patients. I did find right report of an outbreak associated with lubricating jelly was used to insert catheters.  Suprapubic catheter has been exchanged    #4 Blistering rash; he has other areas and per Mom develops such exfoliating rash when he is sick  #4 Diabetic Feet: Has some ulcerations on left foot with plantar and dorsal aspects  --will plain films, low suspicion for osteomyelitis here at present, MRI done in December  Right foot with no visible ulcers but will check plain films  #5  Left hand with exposed tendon:  Check plain films of the left hand  #6 Pneumonia: likely a component of aspiration He is more than adequately covered  I think he is sedated too much of the time and constantly aspirating       LOS: 8 days   Rhina Brackett Dam 11/16/2014, 8:03 PM

## 2014-11-16 NOTE — Consult Note (Signed)
Dr. Migdalia Dk is following this patient now for NPWT VAC changes.  I have contacted the bedside nurse to notify her of this and that Dr. Migdalia Dk would need to be contacted for [facility] change schedule. Bedside nursing is managing this patients ostomy care.    Re consult if needed, will not follow at this time. Thanks  Oanh Devivo Kellogg, [place] [phone])

## 2014-11-16 NOTE — Progress Notes (Signed)
PROGRESS NOTE  Jason [name] DXI:[id number] DOB: 06/05/1986 DOA: 11/08/2014 PCP: [name], MD   Brief History 29 y.o. male with functional quadriplegia,  sacral decubitus ulcer with pelvic osteomyelitis who has a PICC line and undergoing IV vancomycin treatment since first week of January, suprapubic catheter placed by Dr. Vikki Ports 3 week prior to admit, fibromyalgia, spinal cord infarct, colostomy, who had routine blood work done by infectious disease office as he is getting vancomycin, on blood work he was noted to have acute renal failure requested to come to the ER.   In the ER he was found to have sepsis secondary to likely decubitus ulcer infection, white count was elevated from a baseline of around 16,000-24, he had some foul-smelling discharge from the decubitus ulcer, he also had her new blister on his upper back which is typical for him when he gets infection according to the mother, there is some subjective low-grade fevers at home. Assessment/Plan: Sepsis  -secondary to sacral wound infection/pelvic osteomyelitis/intramuscular abscesses -appreciate ID consult -abx per ID -IVF -stool for Cdiff--neg -lactic acid--1.2  -WBC continues to rise, but improved today -11/15/14 CXR with LLL opacity/effusion -CT chest--small L-effusion with areas of consolidation-->possible thoracocentesis--defer to ID Bacteremia--Klebsiella PNA -GNR from 2 of 2 sets on 11/08/14 -antibiotic changes per ID -11/15/14 removed PICC line--surveillance blood cultures remain neg (2/5) -11/16/14 repeat blood cultures Delirium -secondary to opioids -More alert since discontinuation of intravenous opioids--> Back to baseline -pt had this issue on previous admissions -fine balance between pain control and delirium -discussed with mother at bedside-->d/c Dilaudid, restart home percocet Pelvic osteomyelitis/intramuscular abscess/septic arthritis -Failed IV Vanco for the last 3 weeks via  PICC -Continue Zyvox and meropenem -abx changes per ID -appreciate plastic surgery--appreciate Dr. Vevelyn Francois 11/12/14 with [facility] -MRI pelvis--right ischial tuberosity with marrow edema, intramuscular abscesses, small right joint effusion with enhancement -appreciate Dr. Dennison Mascot noted for aspiration under fluoro -11/15/14 hip aspirate-->only 4 WBC -11/16/14-reconsulted plastic surgery--called Dr. Vito Berger of wound VAC change Burkholderia Bacteruria -unusual organism in urine -appreciate Dr. Tresa Moore-- suprapubic cath changed 11/12/14 Atypical Chest pain -cycle troponins -EKG--no concerning ischemic changes AKI -secondary to sepsis and volume depletion; early repolarization -improved Protein calorie malnutrition -Continue protein supplement Hyperkalemia -Kayexalate -IVF Diabetes mellitus type 1 -Continue Lantus and NovoLog sliding scale - hemoglobin A1c--8.8 -11/12/14--pt had hypoglycemia today due to being npo for surgery-->decrease lantus to 20 units -11/13/14--increase Lantus to 25 units at bedtime as the patient's oral intake is improved -pt eating outside food  Spinal cord infarction history  -Patient has suprapubic catheter placed by Dr. McDiarmid 3 weeks ago Tobacco abuse -counseled to quit Iron deficiency anemia  -Iron saturation 6%  -Start ferrous sulfate    Family Communication: Mother updated at beside 2/9 Disposition Plan: Home when cleared by ID        Procedures/Studies: Ct Chest W Contrast  11/15/2014   CLINICAL DATA:  Pleural effusion. Left lower lobe opacity on chest radiograph. Increasing white count on appropriate antibiotics.  EXAM: CT CHEST WITH CONTRAST  TECHNIQUE: Multidetector CT imaging of the chest was performed during intravenous contrast administration.  CONTRAST:  29mL OMNIPAQUE IOHEXOL 300 MG/ML  SOLN  COMPARISON:  Chest 11/15/2014.  FINDINGS: Focal areas of consolidation in both lung bases, greater on the left, with small  left pleural effusion. No evidence of loculated fluid to suggest empyema. Air bronchograms are present with suggestion of mild bronchiectasis. Findings likely represent multifocal pneumonia. No pneumothorax.  Normal heart  size. Normal caliber thoracic aorta. Great vessel origins are patent. No significant lymphadenopathy in the chest. Esophagus is mildly dilated with fluid and debris demonstrated throughout the esophagus. This may be due to reflux or dysmotility.  Included portions of the upper abdominal organs are grossly unremarkable. No destructive bone lesions.  IMPRESSION: Focal areas of consolidation in both lung bases, greater on the left. Small left pleural effusion. Findings likely to represent pneumonia. No evidence of empyema. Mildly dilated esophagus filled with fluid and debris. This could be due to reflux or dysmotility.   Electronically Signed   By: Lucienne Capers M.D.   On: 11/15/2014 22:08   Mr Pelvis W Wo Contrast  11/10/2014   CLINICAL DATA:  Quadriplegia. Sacral decubitus ulcer. Sepsis. Foul-smelling discharge from the decubitus ulcer.  EXAM: MRI PELVIS WITHOUT AND WITH CONTRAST  TECHNIQUE: Multiplanar multisequence MR imaging of the pelvis was performed both before and after administration of intravenous contrast.  CONTRAST:  81mL MULTIHANCE GADOBENATE DIMEGLUMINE 529 MG/ML IV SOLN  COMPARISON:  10/02/2014  FINDINGS: Again noted are decubitus ulcers over the ischial tuberosities, right larger than left. The right decubitus ulcer extends down to the ischial tuberosity with underlying marrow edema and cortical irregularity consistent with osteomyelitis. There is mild diffuse edema of the pelvic musculature with mild enhancement. There are interval development of multiple small nonenhancing fluid collections within the right adductor magnus and adductor brevis muscles extending from the right ischial tuberosity most consistent with intramuscular abscesses. The conglomeration of the area  measures approximately 4.8 x 2 cm, but each individual collection measures significantly smaller than this. There is a rind of nonenhancement around the wound consistent with necrosis.  There is no marrow signal abnormality involving the left ischial tuberosity.  Bilateral hips demonstrate no fracture, dislocation or avascular necrosis. There is a small right hip joint effusion with mild synovial enhancement. There is no bursa formation. There is generalized edema within the subcutaneous fat throughout the pelvis.  IMPRESSION: 1. Again noted is a decubitus ulcer over the ischial tuberosities bilaterally, right larger than left. The right decubitus ulcer extends down to the ischial tuberosity with underlying marrow edema, cortical irregularity and enhancement consistent with osteomyelitis similar in appearance to the prior exam. There is mild diffuse edema throughout the pelvic musculature with mild enhancement with interval development of small nonenhancing fluid collections within the right adductor magnus and adductor brevis muscles extending from the right ischial tuberosity most consistent with intramuscular abscesses. 2. Small right joint effusion with mild synovial enhancement. Septic arthritis cannot be excluded. If there is clinical concern recommend arthrocentesis. 3. Decubitus ulcer overlying the left ischial tuberosity without underlying osseous abnormality. 4. There is diffuse mild edema of the pelvic musculature which is likely neurogenic. There is a concern of an element of infectious myositis especially in the right adductor and gluteal compartment.   Electronically Signed   By: Kathreen Devoid   On: 11/10/2014 17:12   Dg Chest Port 1 View  11/15/2014   CLINICAL DATA:  Subsequent encounter for pneumonia.  EXAM: PORTABLE CHEST - 1 VIEW  COMPARISON:  11/08/2014  FINDINGS: The right PICC line is stable. The tip is near the cavoatrial junction. The cardiac silhouette, mediastinal and hilar contours are  within normal limits and stable. Persistent left lower lobe process, likely pneumonia and small parapneumonic effusion. The right lung remains clear.  IMPRESSION: Persistent left lower lobe process likely a combination of infiltrate and effusion.   Electronically Signed   By: Elta Guadeloupe  Gallerani M.D.   On: 11/15/2014 12:47   Dg Chest Port 1 View  11/08/2014   CLINICAL DATA:  Cough, elevated white count  EXAM: PORTABLE CHEST - 1 VIEW  COMPARISON:  10/10/2014  FINDINGS: Consolidation in the left lower lobe, similar to prior study. Increasing right medial basilar airspace opacity also noted. Findings compatible with pneumonia. No effusions. Right PICC are remains in place, unchanged.  IMPRESSION: Stable left lower lobe pneumonia. Increasing right medial basilar density, also likely pneumonia.   Electronically Signed   By: Rolm Baptise M.D.   On: 11/08/2014 18:48   Dg Fluoro Guide Ndl Plc/bx  11/15/2014   CLINICAL DATA:  RIGHT HIP ASPIRATION INFECTION. PATIENT HAS DECUBITUS ULCERS.  EXAM: RIGHT HIP ASPIRATION UNDER FLUOROSCOPY  FLUOROSCOPY TIME:  If the device does not provide the exposure index:  Fluoroscopy Time (in minutes and seconds):  7 SECONDS  Number of Acquired Images:  0  PROCEDURE: Overlying skin prepped with Betadine, draped in the usual sterile fashion, and infiltrated locally with buffered Lidocaine. Curved 22 gauge spinal needle advanced to the superolateral margin of the RIGHT femoral head. Diagnostic injection of iodinated contrast demonstrates intra-articular spread without intravascular component.  Aspiration was attempted with no return of fluid. Lavage of the right hip joint was performed with 5 mL of sterile saline with subsequent aspiration of 1 mL of saline from the right hip joint. The spinal needle was removed.  The patient tolerated the procedure well. There were no immediate complications. Hemostasis was achieved.  IMPRESSION: Technically successful right hip aspiration under fluoroscopy. 1 mL  of clear right hip joint aspirate was sent for laboratory analysis.   Electronically Signed   By: Kathreen Devoid   On: 11/15/2014 11:34         Subjective:   Objective: Filed Vitals:   11/15/14 2242 11/16/14 0500 11/16/14 0651 11/16/14 1450  BP: 110/75  109/68 114/78  Pulse:   92 87  Temp: 98.7 F (37.1 C)  98.8 F (37.1 C) 98.3 F (36.8 C)  TempSrc: Oral  Oral Oral  Resp: 18  18 18   Height:      Weight:  68.04 kg (150 lb)    SpO2: 100%  100%     Intake/Output Summary (Last 24 hours) at 11/16/14 1957 Last data filed at 11/16/14 1910  Gross per 24 hour  Intake 3415.34 ml  Output   4350 ml  Net -934.66 ml   Weight change: -3.661 kg (-8 lb 1.1 oz) Exam:   General:  Pt is alert, follows commands appropriately, not in acute distress  HEENT: No icterus, No thrush, No neck mass, [place]/AT  Cardiovascular: RRR, S1/S2, no rubs, no gallops  Respiratory: CTA bilaterally, no wheezing, no crackles, no rhonchi  Abdomen: Soft/+BS, non tender, non distended, no guarding  Extremities: No edema, No lymphangitis, No petechiae, No rashes, no synovitis  Data Reviewed: Basic Metabolic Panel:  Recent Labs Lab 11/12/14 0455 11/12/14 1408 11/13/14 0507 11/14/14 0500 11/15/14 0225 11/15/14 1917  NA 138 141 135 135 131* 130*  K 4.6 4.8 4.9 4.9 5.5* 5.3*  CL 111  --  109 109 104 104  CO2 23  --  22 24 23 24   GLUCOSE 160* 56* 209* 145* 174* 153*  BUN 15  --  12 11 11 14   CREATININE 1.15  --  0.81 0.74 0.75 0.83  CALCIUM 10.3  --  10.0 9.6 9.9 10.0   Liver Function Tests: No results for input(s): AST, ALT, ALKPHOS,  BILITOT, PROT, ALBUMIN in the last 168 hours. No results for input(s): LIPASE, AMYLASE in the last 168 hours. No results for input(s): AMMONIA in the last 168 hours. CBC:  Recent Labs Lab 11/13/14 0507 11/13/14 1930 11/14/14 0500 11/15/14 0225 11/16/14 0500  WBC 21.8* 22.4* 23.1* 28.5* 23.1*  HGB 6.9* 8.1* 8.1* 8.5* 8.2*  HCT 22.0* 25.2* 24.8* 26.3* 25.4*   MCV 82.7 82.1 82.1 82.4 82.7  PLT 600* 582* 584* 582* 520*   Cardiac Enzymes:  Recent Labs Lab 11/14/14 1351 11/14/14 1958 11/15/14 0223  TROPONINI <0.03 0.04* 0.05*   BNP: Invalid input(s): POCBNP CBG:  Recent Labs Lab 11/15/14 0743 11/15/14 1228 11/15/14 1657 11/16/14 1139 11/16/14 1749  GLUCAP 148* 88 152* 120* 182*    Recent Results (from the past 240 hour(s))  Urine culture     Status: None   Collection Time: 11/08/14  3:13 PM  Result Value Ref Range Status   Specimen Description URINE, CATHETERIZED  Final   Special Requests NONE  Final   Colony Count   Final    >=100,000 COLONIES/ML Performed at [facility] Performed at [facility]    Report Status 11/11/2014 FINAL  Final   Organism ID, Bacteria BURKHOLDERIA CEPACIA  Final      Susceptibility   Burkholderia cepacia - MIC*    LEVOFLOXACIN 4 INTERMEDIATE Intermediate     TRIMETH/SULFA 160 RESISTANT Resistant     CEFTAZIDIME 8 SENSITIVE Sensitive     * BURKHOLDERIA CEPACIA  Culture, blood (routine x 2)     Status: None   Collection Time: 11/08/14  5:25 PM  Result Value Ref Range Status   Specimen Description BLOOD ARM LEFT  Final   Special Requests BOTTLES DRAWN AEROBIC AND ANAEROBIC 10CC  Final   Culture   Final    KLEBSIELLA PNEUMONIAE Note: Culture results may be compromised due to an excessive volume of blood received in culture bottles. Gram Stain Report Called to,Read Back By and Verified With: Juanetta Beets 11/12/14 0144A [place] Performed at [facility]    Report Status 11/14/2014 FINAL  Final   Organism ID, Bacteria KLEBSIELLA PNEUMONIAE  Final      Susceptibility   Klebsiella pneumoniae - MIC*    AMPICILLIN RESISTANT      AMPICILLIN/SULBACTAM 4 SENSITIVE Sensitive     CEFAZOLIN <=4 SENSITIVE Sensitive     CEFEPIME <=1 SENSITIVE Sensitive     CEFTAZIDIME <=1 SENSITIVE Sensitive     CEFTRIAXONE <=1 SENSITIVE Sensitive      CIPROFLOXACIN <=0.25 SENSITIVE Sensitive     GENTAMICIN <=1 SENSITIVE Sensitive     IMIPENEM <=0.25 SENSITIVE Sensitive     PIP/TAZO <=4 SENSITIVE Sensitive     TOBRAMYCIN <=1 SENSITIVE Sensitive     TRIMETH/SULFA <=20 SENSITIVE Sensitive     * KLEBSIELLA PNEUMONIAE  Culture, blood (routine x 2)     Status: None   Collection Time: 11/08/14  5:35 PM  Result Value Ref Range Status   Specimen Description BLOOD ARM LEFT  Final   Special Requests BOTTLES DRAWN AEROBIC AND ANAEROBIC 10CC  Final   Culture   Final    KLEBSIELLA PNEUMONIAE Note: SUSCEPTIBILITIES PERFORMED ON PREVIOUS CULTURE WITHIN THE LAST 5 DAYS. Gram Stain Report Called to,Read Back By and Verified With: Iran Sizer RN @ [id number] 11/12/14 Performed at [facility]    Report Status 11/14/2014 FINAL  Final  Clostridium Difficile by  PCR     Status: None   Collection Time: 11/12/14  8:41 AM  Result Value Ref Range Status   C difficile by pcr NEGATIVE NEGATIVE Final  Culture, blood (routine x 2)     Status: None (Preliminary result)   Collection Time: 11/12/14  8:04 PM  Result Value Ref Range Status   Specimen Description BLOOD LEFT ARM  Final   Special Requests BOTTLES DRAWN AEROBIC AND ANAEROBIC 10CC  Final   Culture   Final           BLOOD CULTURE RECEIVED NO GROWTH TO DATE CULTURE WILL BE HELD FOR 5 DAYS BEFORE ISSUING A FINAL NEGATIVE REPORT Note: Culture results may be compromised due to an excessive volume of blood received in culture bottles. Performed at [facility]    Report Status PENDING  Incomplete  Culture, blood (routine x 2)     Status: None (Preliminary result)   Collection Time: 11/12/14  8:19 PM  Result Value Ref Range Status   Specimen Description BLOOD LEFT ARM  Final   Special Requests   Final    BOTTLES DRAWN AEROBIC AND ANAEROBIC BLUE 10CC RED 5CC   Culture   Final           BLOOD CULTURE RECEIVED NO GROWTH TO DATE CULTURE WILL BE HELD FOR 5 DAYS BEFORE ISSUING A FINAL NEGATIVE  REPORT Performed at [facility]    Report Status PENDING  Incomplete  Body fluid culture     Status: None (Preliminary result)   Collection Time: 11/15/14 11:03 AM  Result Value Ref Range Status   Specimen Description FLUID HIP RIGHT SYNOVIAL  Final   Special Requests NONE  Final   Gram Stain   Final    RARE WBC PRESENT, PREDOMINANTLY MONONUCLEAR NO ORGANISMS SEEN Performed at [facility]    Culture PENDING  Incomplete   Report Status PENDING  Incomplete     Scheduled Meds: . sodium chloride   Intravenous Once  . atorvastatin  20 mg Oral q1800  . baclofen  10 mg Oral BID WC  . baclofen  20 mg Oral QHS  . feeding supplement (GLUCERNA SHAKE)  237 mL Oral TID BM  . feeding supplement (PRO-STAT SUGAR FREE 64)  30 mL Oral Daily  . fentaNYL  25 mcg Transdermal Q72H  . heparin  5,000 Units Subcutaneous 3 times per day  . insulin aspart  0-5 Units Subcutaneous QHS  . insulin aspart  0-9 Units Subcutaneous TID WC  . insulin aspart  4 Units Subcutaneous TID AC  . insulin glargine  25 Units Subcutaneous QHS  . loratadine  10 mg Oral Daily  . meropenem (MERREM) IV  1 g Intravenous Q8H  . pantoprazole  40 mg Oral Daily  . pregabalin  100 mg Oral TID AC  . pregabalin  200 mg Oral QHS  . vancomycin  750 mg Intravenous Q12H   Continuous Infusions: . sodium chloride 100 mL/hr at 11/16/14 0921     Axcel Horsch, DO  [facility] Pager [phone]  If 7PM-7AM, please contact night-coverage [email or web address] Password [email or web address] 11/16/2014, 7:57 PM   LOS: 8 days

## 2014-11-17 ENCOUNTER — Inpatient Hospital Stay (HOSPITAL_COMMUNITY): Payer: Medicaid Other

## 2014-11-17 ENCOUNTER — Other Ambulatory Visit: Payer: Self-pay | Admitting: Plastic Surgery

## 2014-11-17 DIAGNOSIS — L89154 Pressure ulcer of sacral region, stage 4: Secondary | ICD-10-CM

## 2014-11-17 LAB — GLUCOSE, CAPILLARY
COMMENT 3: 3
GLUCOSE-CAPILLARY: 145 mg/dL — AB (ref 70–99)
GLUCOSE-CAPILLARY: 204 mg/dL — AB (ref 70–99)
Glucose-Capillary: 156 mg/dL — ABNORMAL HIGH (ref 70–99)
Glucose-Capillary: 158 mg/dL — ABNORMAL HIGH (ref 70–99)
Glucose-Capillary: 145 mg/dL — ABNORMAL HIGH (ref 70–99)
Glucose-Capillary: 253 mg/dL — ABNORMAL HIGH (ref 70–99)

## 2014-11-17 LAB — PREALBUMIN: PREALBUMIN: 13.9 mg/dL — AB (ref 17.0–34.0)

## 2014-11-17 NOTE — Progress Notes (Signed)
Physical Therapy Treatment Patient Details Name: Jason [name] MRN: [id number] DOB: 1985/12/11 Today's Date: 11/17/2014    History of Present Illness 29 y.o. male admitted from home to [facility] on 11/08/14 for decubitus ulcers infection management and osteomyelitis.  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 Pt underwent surgical cleaning and debridement of his sacral and ischial wounds on 11/12/14 with palcement of Acell and biopsy of coccx bone.  Wound vac placed as well.     PT Comments    Progressing slowly.  Emphasis continuing on sitting balance and progressed today to scoot transfer to the chair via sliding board, pt needing significant truncal assist.   Follow Up Recommendations  Other (comment) (pt will be going home  without services)     Equipment Recommendations  Other (comment) (Hoping to finalize w/c upgrade)    Recommendations for Other Services       Precautions / Restrictions Precautions Precautions: Fall;Other (comment) Precaution Comments: Per mom, wound care MD PTA did not want him up for longer than an hour at a time due to his wounds.   Pt also now has wound vac.  Has PTA left ostomy bag and suprapubic catheter.  Restrictions Weight Bearing Restrictions: No    Mobility  Bed Mobility Overal bed mobility: Needs Assistance Bed Mobility: Rolling;Sidelying to Sit Rolling: Mod assist Sidelying to sit: Mod assist;+2 for safety/equipment       General bed mobility comments: 2 person assist for safety and allow for some support so pt can shift his hands for better UE assist  Transfers Overall transfer level: Needs assistance   Transfers: Lateral/Scoot Transfers          Lateral/Scoot Transfers: Max assist;+2 physical assistance;+2 safety/equipment;With slide  board General transfer comment: Needed assist for w/shift and balance support to be able to optimize hand placement.  Ambulation/Gait                 Stairs            Wheelchair Mobility    Modified Rankin (Stroke Patients Only)       Balance Overall balance assessment: Needs assistance Sitting-balance support: Bilateral upper extremity supported Sitting balance-Leahy Scale: Poor Sitting balance - Comments: sat EOB x 7 min working balance in midline with bilateral UE assist                            Cognition Arousal/Alertness: Awake/alert Behavior During Therapy: WFL for tasks assessed/performed Overall Cognitive Status: Within Functional Limits for tasks assessed                      Exercises      General Comments        Pertinent Vitals/Pain Pain Score: 6  Pain Location: buttock and back Pain Descriptors / Indicators: Burning Pain Intervention(s): Monitored during session;Repositioned    Home Living                      Prior Function            PT Goals (current goals can now be found in the care plan section) Acute Rehab PT Goals Patient Stated Goal: to decrease pain, go home PT Goal Formulation: With patient/family Time For Goal Achievement: 11/23/14 Potential to Achieve Goals: Fair Progress towards PT goals:  Progressing toward goals    Frequency  Min 3X/week    PT Plan Discharge plan needs to be updated    Co-evaluation             End of Session   Activity Tolerance: Patient limited by fatigue Patient left: in chair;with call bell/phone within reach     Time: 1350-1428 PT Time Calculation (min) (ACUTE ONLY): 38 min  Charges:  $Therapeutic Activity: 23-37 mins                    G Codes:      Charnette Younkin, [name] 11/17/2014, 3:49 PM 11/17/2014  Donnella Sham, PT [phone]  (pager)

## 2014-11-17 NOTE — Progress Notes (Signed)
[facility]    Subjective: Awake sitting in chair  Antibiotics:  Anti-infectives    Start     Dose/Rate Route Frequency Ordered Stop   11/16/14 1000  vancomycin (VANCOCIN) IVPB 750 mg/150 ml premix     750 mg 150 mL/hr over 60 Minutes Intravenous Every 12 hours 11/16/14 0957     11/15/14 1700  vancomycin (VANCOCIN) IVPB 750 mg/150 ml premix  Status:  Discontinued     750 mg 150 mL/hr over 60 Minutes Intravenous Every 8 hours 11/15/14 1610 11/16/14 0957   11/12/14 1317  polymyxin B 500,000 Units, bacitracin 50,000 Units in sodium chloride irrigation 0.9 % 500 mL irrigation  Status:  Discontinued       As needed 11/12/14 1319 11/12/14 1417   11/09/14 1400  linezolid (ZYVOX) tablet 600 mg  Status:  Discontinued     600 mg Oral Every 12 hours 11/09/14 1319 11/15/14 1542   11/09/14 0700  vancomycin (VANCOCIN) 500 mg in sodium chloride 0.9 % 100 mL IVPB  Status:  Discontinued     500 mg 100 mL/hr over 60 Minutes Intravenous Every 12 hours 11/08/14 1732 11/08/14 1737   11/08/14 2200  vancomycin (VANCOCIN) IVPB 750 mg/150 ml premix  Status:  Discontinued     750 mg 150 mL/hr over 60 Minutes Intravenous Every 24 hours 11/08/14 1737 11/09/14 1319   11/08/14 1800  meropenem (MERREM) 1 g in sodium chloride 0.9 % 100 mL IVPB     1 g 200 mL/hr over 30 Minutes Intravenous Every 8 hours 11/08/14 1732     11/08/14 1745  vancomycin (VANCOCIN) 1,250 mg in sodium chloride 0.9 % 250 mL IVPB  Status:  Discontinued     1,250 mg 166.7 mL/hr over 90 Minutes Intravenous  Once 11/08/14 1732 11/08/14 1734      Medications: Scheduled Meds: . sodium chloride   Intravenous Once  . atorvastatin  20 mg Oral q1800  . baclofen  10 mg Oral BID WC  . baclofen  20 mg Oral QHS  . feeding supplement (GLUCERNA SHAKE)  237 mL Oral TID BM  . feeding supplement (PRO-STAT SUGAR FREE 64)  30 mL Oral Daily  . fentaNYL  25 mcg Transdermal Q72H  . heparin  5,000 Units Subcutaneous 3 times  per day  . insulin aspart  0-5 Units Subcutaneous QHS  . insulin aspart  0-9 Units Subcutaneous TID WC  . insulin aspart  4 Units Subcutaneous TID AC  . insulin glargine  25 Units Subcutaneous QHS  . loratadine  10 mg Oral Daily  . meropenem (MERREM) IV  1 g Intravenous Q8H  . pantoprazole  40 mg Oral Daily  . pregabalin  100 mg Oral TID AC  . pregabalin  200 mg Oral QHS  . vancomycin  750 mg Intravenous Q12H   Continuous Infusions: . sodium chloride 50 mL/hr at 11/17/14 1511   PRN Meds:.bisacodyl, gi cocktail, guaiFENesin-dextromethorphan, metoCLOPramide, ondansetron **OR** ondansetron (ZOFRAN) IV, oxyCODONE-acetaminophen **AND** oxyCODONE, polyethylene glycol, sodium chloride, sodium chloride    Objective: Weight change: 6.4 oz (0.181 kg)  Intake/Output Summary (Last 24 hours) at 11/17/14 1908 Last data filed at 11/17/14 1850  Gross per 24 hour  Intake 2800.83 ml  Output   4050 ml  Net -1249.17 ml   Blood pressure 106/64, pulse 85, temperature 98.2 F (36.8 C), temperature source Oral, resp. rate 17, height 5\' 8"  (1.727 m), weight 150 lb 6.4 oz (68.221 kg), SpO2 100 %. Temp:  [  98.2 F (36.8 C)-98.3 F (36.8 C)] 98.2 F (36.8 C) (02/10 0825) Pulse Rate:  [83-109] 85 (02/10 0825) Resp:  [12-18] 17 (02/10 0825) BP: (106-112)/(64-75) 106/64 mmHg (02/10 0825) SpO2:  [100 %] 100 % (02/10 0825) Weight:  [150 lb 6.4 oz (68.221 kg)] 150 lb 6.4 oz (68.221 kg) (02/10 0500)  Physical Exam: General: awake and alert, feeling better  CV: rr Pulm: fairly clear. Skin:   Left foot   09/29/2014:      11/16/14:           Right foot      11/16/14      Left hand where he had blister per Mom in November with exposed tendon:         Neuro: paraplegic   CBC:  CBC Latest Ref Rng 11/16/2014 11/15/2014 11/14/2014  WBC 4.0 - 10.5 K/uL 23.1(H) 28.5(H) 23.1(H)  Hemoglobin 13.0 - 17.0 g/dL 8.2(L) 8.5(L) 8.1(L)  Hematocrit 39.0 - 52.0 % 25.4(L) 26.3(L) 24.8(L)    Platelets 150 - 400 K/uL 520(H) 582(H) 584(H)       BMET  Recent Labs  11/15/14 0225 11/15/14 1917  NA 131* 130*  K 5.5* 5.3*  CL 104 104  CO2 23 24  GLUCOSE 174* 153*  BUN 11 14  CREATININE 0.75 0.83  CALCIUM 9.9 10.0     Liver Panel  No results for input(s): PROT, ALBUMIN, AST, ALT, ALKPHOS, BILITOT, BILIDIR, IBILI in the last 72 hours.     Sedimentation Rate No results for input(s): ESRSEDRATE in the last 72 hours. C-Reactive Protein No results for input(s): CRP in the last 72 hours.  Micro Results: Recent Results (from the past 720 hour(s))  Urine culture     Status: None   Collection Time: 11/08/14  3:13 PM  Result Value Ref Range Status   Specimen Description URINE, CATHETERIZED  Final   Special Requests NONE  Final   Colony Count   Final    >=100,000 COLONIES/ML Performed at [place]   Final    BURKHOLDERIA CEPACIA Performed at [facility]    Report Status 11/11/2014 FINAL  Final   Organism ID, Bacteria BURKHOLDERIA CEPACIA  Final      Susceptibility   Burkholderia cepacia - MIC*    LEVOFLOXACIN 4 INTERMEDIATE Intermediate     TRIMETH/SULFA 160 RESISTANT Resistant     CEFTAZIDIME 8 SENSITIVE Sensitive     * BURKHOLDERIA CEPACIA  Culture, blood (routine x 2)     Status: None   Collection Time: 11/08/14  5:25 PM  Result Value Ref Range Status   Specimen Description BLOOD ARM LEFT  Final   Special Requests BOTTLES DRAWN AEROBIC AND ANAEROBIC 10CC  Final   Culture   Final    KLEBSIELLA PNEUMONIAE Note: Culture results may be compromised due to an excessive volume of blood received in culture bottles. Gram Stain Report Called to,Read Back By and Verified With: Juanetta Beets 11/12/14 0144A [place] Performed at [facility]    Report Status 11/14/2014 FINAL  Final   Organism ID, Bacteria KLEBSIELLA PNEUMONIAE  Final      Susceptibility   Klebsiella pneumoniae - MIC*    AMPICILLIN RESISTANT       AMPICILLIN/SULBACTAM 4 SENSITIVE Sensitive     CEFAZOLIN <=4 SENSITIVE Sensitive     CEFEPIME <=1 SENSITIVE Sensitive     CEFTAZIDIME <=1 SENSITIVE Sensitive     CEFTRIAXONE <=1 SENSITIVE Sensitive     CIPROFLOXACIN <=0.25 SENSITIVE  Sensitive     GENTAMICIN <=1 SENSITIVE Sensitive     IMIPENEM <=0.25 SENSITIVE Sensitive     PIP/TAZO <=4 SENSITIVE Sensitive     TOBRAMYCIN <=1 SENSITIVE Sensitive     TRIMETH/SULFA <=20 SENSITIVE Sensitive     * KLEBSIELLA PNEUMONIAE  Culture, blood (routine x 2)     Status: None   Collection Time: 11/08/14  5:35 PM  Result Value Ref Range Status   Specimen Description BLOOD ARM LEFT  Final   Special Requests BOTTLES DRAWN AEROBIC AND ANAEROBIC 10CC  Final   Culture   Final    KLEBSIELLA PNEUMONIAE Note: SUSCEPTIBILITIES PERFORMED ON PREVIOUS CULTURE WITHIN THE LAST 5 DAYS. Gram Stain Report Called to,Read Back By and Verified With: Iran Sizer RN @ [id number] 11/12/14 Performed at [facility]    Report Status 11/14/2014 FINAL  Final  Clostridium Difficile by PCR     Status: None   Collection Time: 11/12/14  8:41 AM  Result Value Ref Range Status   C difficile by pcr NEGATIVE NEGATIVE Final  Culture, blood (routine x 2)     Status: None (Preliminary result)   Collection Time: 11/12/14  8:04 PM  Result Value Ref Range Status   Specimen Description BLOOD LEFT ARM  Final   Special Requests BOTTLES DRAWN AEROBIC AND ANAEROBIC 10CC  Final   Culture   Final           BLOOD CULTURE RECEIVED NO GROWTH TO DATE CULTURE WILL BE HELD FOR 5 DAYS BEFORE ISSUING A FINAL NEGATIVE REPORT Note: Culture results may be compromised due to an excessive volume of blood received in culture bottles. Performed at [facility]    Report Status PENDING  Incomplete  Culture, blood (routine x 2)     Status: None (Preliminary result)   Collection Time: 11/12/14  8:19 PM  Result Value Ref Range Status   Specimen Description BLOOD LEFT ARM  Final   Special  Requests   Final    BOTTLES DRAWN AEROBIC AND ANAEROBIC BLUE 10CC RED 5CC   Culture   Final           BLOOD CULTURE RECEIVED NO GROWTH TO DATE CULTURE WILL BE HELD FOR 5 DAYS BEFORE ISSUING A FINAL NEGATIVE REPORT Performed at [facility]    Report Status PENDING  Incomplete  Body fluid culture     Status: None (Preliminary result)   Collection Time: 11/15/14 11:03 AM  Result Value Ref Range Status   Specimen Description FLUID HIP RIGHT SYNOVIAL  Final   Special Requests NONE  Final   Gram Stain   Final    RARE WBC PRESENT, PREDOMINANTLY MONONUCLEAR NO ORGANISMS SEEN Performed at [facility]    Culture   Final    NO GROWTH 1 DAY Performed at [facility]    Report Status PENDING  Incomplete    Studies/Results: Ct Chest W Contrast  11/15/2014   CLINICAL DATA:  Pleural effusion. Left lower lobe opacity on chest radiograph. Increasing white count on appropriate antibiotics.  EXAM: CT CHEST WITH CONTRAST  TECHNIQUE: Multidetector CT imaging of the chest was performed during intravenous contrast administration.  CONTRAST:  49mL OMNIPAQUE IOHEXOL 300 MG/ML  SOLN  COMPARISON:  Chest 11/15/2014.  FINDINGS: Focal areas of consolidation in both lung bases, greater on the left, with small left pleural effusion. No evidence of loculated fluid to suggest empyema. Air bronchograms are present with suggestion of mild bronchiectasis. Findings likely represent multifocal  pneumonia. No pneumothorax.  Normal heart size. Normal caliber thoracic aorta. Great vessel origins are patent. No significant lymphadenopathy in the chest. Esophagus is mildly dilated with fluid and debris demonstrated throughout the esophagus. This may be due to reflux or dysmotility.  Included portions of the upper abdominal organs are grossly unremarkable. No destructive bone lesions.  IMPRESSION: Focal areas of consolidation in both lung bases, greater on the left. Small left pleural effusion. Findings likely  to represent pneumonia. No evidence of empyema. Mildly dilated esophagus filled with fluid and debris. This could be due to reflux or dysmotility.   Electronically Signed   By: Jason [name] M.D.   On: 11/15/2014 22:08   Dg Hand Complete Left  11/17/2014   CLINICAL DATA:  Nonhealing ulcer in left middle finger.  EXAM: LEFT HAND - COMPLETE 3+ VIEW  COMPARISON:  None.  FINDINGS: There is no evidence of fracture or dislocation. No lytic destruction is seen to suggest osteomyelitis. There is no evidence of arthropathy or other focal bone abnormality. Vascular calcifications are noted.  IMPRESSION: No significant abnormality seen in the left hand.   Electronically Signed   By: Jason [name], M.D.   On: 11/17/2014 11:49   Dg Foot Complete Left  11/17/2014   CLINICAL DATA:  Nonhealing diabetic ulcers.  EXAM: LEFT FOOT - COMPLETE 3+ VIEW  COMPARISON:  None.  FINDINGS: There is no evidence of fracture or dislocation. There is no evidence of arthropathy or other focal bone abnormality. Vascular calcifications are noted. No lytic destruction is seen to suggest osteomyelitis.  IMPRESSION: No evidence of osteomyelitis.   Electronically Signed   By: Jason [name], M.D.   On: 11/17/2014 11:58   Dg Foot Complete Right  11/17/2014   CLINICAL DATA:  Diabetic ulcers.  EXAM: RIGHT FOOT COMPLETE - 3+ VIEW  COMPARISON:  None.  FINDINGS: There is no evidence of fracture or dislocation. There is no evidence of arthropathy or other focal bone abnormality. Vascular calcifications are noted. No radiopaque foreign body is noted. No lytic destruction is seen to suggest osteomyelitis.  IMPRESSION: No acute abnormality seen in the right foot.   Electronically Signed   By: Jason [name], M.D.   On: 11/17/2014 11:55      Assessment/Plan:  Active Problems:   Type 1 diabetes, uncontrolled, with neuropathy   GERD   Gastroparesis due to DM   Protein-calorie malnutrition, severe   Spinal cord infarction (history of)    Tetraplegia   Neurogenic bladder   Sepsis   Severe sepsis   Stage IV decubitus ulcer   Osteomyelitis   Osteomyelitis, pelvis   AKI (acute kidney injury)   Decubitus ulcer   Osteomyelitis, pelvic region and thigh   Septic hip   Burkholderia cepacia infection   Urinary tract infectious disease   Suprapubic catheter   Decubitus ulcer of ischial area   Sacral decubitus ulcer   Gram-negative bacteremia   Right hip joint effusion   Klebsiella pneumoniae infection   Finger ulcer   Pleural effusion   Diabetic ulcer of left foot associated with diabetes mellitus due to underlying condition   Aspiration pneumonia    Jason [name] is a 29 y.o. male with Paraplegia, prior MSSA bacteremia, decubitus ulcers, pelvic osteomyelitis sp I and D by Dr [place] Silos with bone cultures sent in December on vancomycin which grew multiple organisms but no staph or strep. Patient went home on IV vancomycin alone.  In the interim he appeared to have  developed a blistering of his skin which occurred while his wound had packing material in place for protracted amount of time due to the fact that he did not have a home health nurse to come visit him due to the winter weather. Since that is yet also had worsening foul-smelling drainage from his midline large decubitus ulcer that appears worse on exam today than when I saw him last. MRI done today shows ischial osteomyelitis on the right that is stable on exam, nonenhancing fluid collections withinthe right adductor magnus and adductor brevis muscles extending fromthe right ischial tuberosity consistent with intramuscular Abscesses, Small right joint effusion with mild synovial enhancement, ulcer of the left ischial tuberosity without underlying bone abnormalities, edema of the pelvic musculature.    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 Migdalia Dk took patient to the OR  For surgery and obtained deep cultures.  Also Greatly appreciate Dr. Marlou Sa from Orthopedics seeing the patient  --followup OPERATIVE CULTURES  --ASPIRATE BY FLUORO OF HIP  Only 4 WBC  --continue  IV vancomycin with merrem  #2  Klebsiella PNA bacteremia  22 blood cultures:  DC'd PICC peripheral IV  repeat blood cultures taken  Holiday from central line  Do not place new one until NGTD on blood cultures from today x 3 days  #3 Burkholderia cepacia UTI:   This is a MDR pathogen typically and NOT typically found in urine but rather in the lungs of CF patients, Polly immunocompromised patients. I did find right report of an outbreak associated with lubricating jelly was used to insert catheters.  Suprapubic catheter has been exchanged    #4 Blistering rash; he has other areas and per Mom develops such exfoliating rash when he is sick  #4 Diabetic Feet: Has some ulcerations on left foot with plantar and dorsal aspects  - plain films without osteomyelitis    #5  Left hand with exposed tendon: plain films fine  #6 Pneumonia: likely a component of aspiration He is more than adequately covered  I think he is sedated too much of the time and constantly aspirating       LOS: 9 days   Rhina Brackett Dam 11/17/2014, 7:08 PM

## 2014-11-17 NOTE — Progress Notes (Signed)
PATIENT DETAILS Name: Jason [name] Age: 29 y.o. Sex: male Date of Birth: 05-21-1986 Admit Date: 11/08/2014 Admitting Physician Thurnell Lose, MD PCP:FRY,STEPHEN A, MD  Subjective: No major complaints  Assessment/Plan: Active Problems:   Sepsis: secondary to multiple foci of infetion-Klebsiella Bacteremia,Pelvic osteomyelitis with intramuscular abscess, HCAP and UTI.On empiric Abx as outlined below.    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 It also showed multiple fluid collections within the abductor magnus and abductor brevis muscles likely consistent with intramuscular abscesses. Subsequently seen by plastic surgery, underwent debridement on 11/12/14 with VAC. Also seen by orthopedics (Dr Marlou Sa), underwent hip aspiration on 2/8-neg culture so far. Current recommendations from infectious disease to continue with current antibiotics as outlined below.Plastic plans on VAC change in OR.   Klebsiella pneumoniae bacteremia: Blood culture on 11/08/14 positive for Klebsiella pneumoniae. Had a PICC line in place that has  been discontinued. Repeat blood cultures on 2/5 negative so far, blood culture on 2/9 pending-per ID to replace PICC line once cultures from 2/9 neg for 3 days.   Burkholderia UTI: per ID  unusual organism in urine.Suprapubic Catheter has been changed on 2/5, on antibiotics as outlined below    Healthcare associated pneumonia: Antibiotics as above. Cultures as above. Remains afebrile.Repeat CBC in am.   Delirium: Resolved. Secondary to opioids. Avoid IV opioids in any further escalation in current dosing.   Type 1 diabetes: CBGs stable. Continue with Lantus and assess side.    Functional quadriplegia with neurogenic bladder: Bedbound, suprapubic catheter in place. Secondary to spinal cord infarction.    Severe Protein calorie  malnutrition: Continue with supplements   History of gastroparesis: Currently without any vomiting, continue with as needed Reglan.    Chronic pain syndrome: Continue with transdermal fentanyl and when necessary oxycodone. Avoid further escalation and narcotic dosing.    GERD: Continue PPI   Disposition: Remain inpatient-Home with Home health services in 2-3 days  Antibiotics:  See below   Anti-infectives    Start     Dose/Rate Route Frequency Ordered Stop   11/16/14 1000  vancomycin (VANCOCIN) IVPB 750 mg/150 ml premix     750 mg 150 mL/hr over 60 Minutes Intravenous Every 12 hours 11/16/14 0957     11/15/14 1700  vancomycin (VANCOCIN) IVPB 750 mg/150 ml premix  Status:  Discontinued     750 mg 150 mL/hr over 60 Minutes Intravenous Every 8 hours 11/15/14 1610 11/16/14 0957   11/12/14 1317  polymyxin B 500,000 Units, bacitracin 50,000 Units in sodium chloride irrigation 0.9 % 500 mL irrigation  Status:  Discontinued       As needed 11/12/14 1319 11/12/14 1417   11/09/14 1400  linezolid (ZYVOX) tablet 600 mg  Status:  Discontinued     600 mg Oral Every 12 hours 11/09/14 1319 11/15/14 1542   11/09/14 0700  vancomycin (VANCOCIN) 500 mg in sodium chloride 0.9 % 100 mL IVPB  Status:  Discontinued     500 mg 100 mL/hr over 60 Minutes Intravenous Every 12 hours 11/08/14 1732 11/08/14 1737   11/08/14 2200  vancomycin (VANCOCIN) IVPB 750 mg/150 ml premix  Status:  Discontinued     750 mg 150 mL/hr over 60 Minutes Intravenous Every 24 hours 11/08/14 1737 11/09/14 1319   11/08/14 1800  meropenem (MERREM) 1 g in sodium chloride 0.9 % 100 mL IVPB  1 g 200 mL/hr over 30 Minutes Intravenous Every 8 hours 11/08/14 1732     11/08/14 1745  vancomycin (VANCOCIN) 1,250 mg in sodium chloride 0.9 % 250 mL IVPB  Status:  Discontinued     1,250 mg 166.7 mL/hr over 90 Minutes Intravenous  Once 11/08/14 1732 11/08/14 1734      DVT Prophylaxis: Prophylactic  Heparin  Code Status: Full code    Family Communication Mother at bedside  Procedures:  2/5 Debridement of decubitus  2/8 Hip aspirate  CONSULTS:  ID and orthopedic surgery  Plastics  MEDICATIONS: Scheduled Meds: . sodium chloride   Intravenous Once  . atorvastatin  20 mg Oral q1800  . baclofen  10 mg Oral BID WC  . baclofen  20 mg Oral QHS  . feeding supplement (GLUCERNA SHAKE)  237 mL Oral TID BM  . feeding supplement (PRO-STAT SUGAR FREE 64)  30 mL Oral Daily  . fentaNYL  25 mcg Transdermal Q72H  . heparin  5,000 Units Subcutaneous 3 times per day  . insulin aspart  0-5 Units Subcutaneous QHS  . insulin aspart  0-9 Units Subcutaneous TID WC  . insulin aspart  4 Units Subcutaneous TID AC  . insulin glargine  25 Units Subcutaneous QHS  . loratadine  10 mg Oral Daily  . meropenem (MERREM) IV  1 g Intravenous Q8H  . pantoprazole  40 mg Oral Daily  . pregabalin  100 mg Oral TID AC  . pregabalin  200 mg Oral QHS  . vancomycin  750 mg Intravenous Q12H   Continuous Infusions: . sodium chloride 100 mL/hr at 11/17/14 1337   PRN Meds:.bisacodyl, gi cocktail, guaiFENesin-dextromethorphan, metoCLOPramide, ondansetron **OR** ondansetron (ZOFRAN) IV, oxyCODONE-acetaminophen **AND** oxyCODONE, polyethylene glycol, sodium chloride, sodium chloride    PHYSICAL EXAM: Vital signs in last 24 hours: Filed Vitals:   11/16/14 1450 11/16/14 2229 11/17/14 0500 11/17/14 0825  BP: 114/78 108/74 112/75 106/64  Pulse: 87 109 83 85  Temp: 98.3 F (36.8 C) 98.3 F (36.8 C) 98.2 F (36.8 C) 98.2 F (36.8 C)  TempSrc: Oral Oral  Oral  Resp: 18 18 12 17   Height:      Weight:   68.221 kg (150 lb 6.4 oz)   SpO2:  100% 100% 100%    Weight change: 0.181 kg (6.4 oz) Filed Weights   11/15/14 0653 11/16/14 0500 11/17/14 0500  Weight: 68.8 kg (151 lb 10.8 oz) 68.04 kg (150 lb) 68.221 kg (150 lb 6.4 oz)   Body mass index is 22.87 kg/(m^2).   Gen Exam: Awake and alert with clear speech.   Neck: Supple, No JVD.   Chest:  B/L Clear.   CVS: S1 S2 Regular, no murmurs.  Abdomen: soft, BS +, non tender, non distended.   Intake/Output from previous day:  Intake/Output Summary (Last 24 hours) at 11/17/14 1441 Last data filed at 11/17/14 1107  Gross per 24 hour  Intake   3100 ml  Output   4400 ml  Net  -1300 ml     LAB RESULTS: CBC  Recent Labs Lab 11/13/14 0507 11/13/14 1930 11/14/14 0500 11/15/14 0225 11/16/14 0500  WBC 21.8* 22.4* 23.1* 28.5* 23.1*  HGB 6.9* 8.1* 8.1* 8.5* 8.2*  HCT 22.0* 25.2* 24.8* 26.3* 25.4*  PLT 600* 582* 584* 582* 520*  MCV 82.7 82.1 82.1 82.4 82.7  MCH 25.9* 26.4 26.8 26.6 26.7  MCHC 31.4 32.1 32.7 32.3 32.3  RDW 16.5* 15.9* 15.9* 16.1* 16.5*    Chemistries   Recent Labs  Lab 11/12/14 0455 11/12/14 1408 11/13/14 0507 11/14/14 0500 11/15/14 0225 11/15/14 1917  NA 138 141 135 135 131* 130*  K 4.6 4.8 4.9 4.9 5.5* 5.3*  CL 111  --  109 109 104 104  CO2 23  --  22 24 23 24   GLUCOSE 160* 56* 209* 145* 174* 153*  BUN 15  --  12 11 11 14   CREATININE 1.15  --  0.81 0.74 0.75 0.83  CALCIUM 10.3  --  10.0 9.6 9.9 10.0    CBG:  Recent Labs Lab 11/16/14 1139 11/16/14 1749 11/16/14 2226 11/17/14 0822 11/17/14 1201  GLUCAP 120* 182* 261* 145* 145*    GFR Estimated Creatinine Clearance: 125.5 mL/min (by C-G formula based on Cr of 0.83).  Coagulation profile No results for input(s): INR, PROTIME in the last 168 hours.  Cardiac Enzymes  Recent Labs Lab 11/14/14 1351 11/14/14 1958 11/15/14 0223  TROPONINI <0.03 0.04* 0.05*    Invalid input(s): POCBNP No results for input(s): DDIMER in the last 72 hours. No results for input(s): HGBA1C in the last 72 hours. No results for input(s): CHOL, HDL, LDLCALC, TRIG, CHOLHDL, LDLDIRECT in the last 72 hours. No results for input(s): TSH, T4TOTAL, T3FREE, THYROIDAB in the last 72 hours.  Invalid input(s): FREET3 No results for input(s): VITAMINB12, FOLATE, FERRITIN, TIBC, IRON, RETICCTPCT in the last 72  hours. No results for input(s): LIPASE, AMYLASE in the last 72 hours.  Urine Studies No results for input(s): UHGB, CRYS in the last 72 hours.  Invalid input(s): UACOL, UAPR, USPG, UPH, UTP, UGL, UKET, UBIL, UNIT, UROB, ULEU, UEPI, UWBC, URBC, UBAC, CAST, UCOM, BILUA  MICROBIOLOGY: Recent Results (from the past 240 hour(s))  Urine culture     Status: None   Collection Time: 11/08/14  3:13 PM  Result Value Ref Range Status   Specimen Description URINE, CATHETERIZED  Final   Special Requests NONE  Final   Colony Count   Final    >=100,000 COLONIES/ML Performed at [place] Performed at [facility]    Report Status 11/11/2014 FINAL  Final   Organism ID, Bacteria BURKHOLDERIA CEPACIA  Final      Susceptibility   Burkholderia cepacia - MIC*    LEVOFLOXACIN 4 INTERMEDIATE Intermediate     TRIMETH/SULFA 160 RESISTANT Resistant     CEFTAZIDIME 8 SENSITIVE Sensitive     * BURKHOLDERIA CEPACIA  Culture, blood (routine x 2)     Status: None   Collection Time: 11/08/14  5:25 PM  Result Value Ref Range Status   Specimen Description BLOOD ARM LEFT  Final   Special Requests BOTTLES DRAWN AEROBIC AND ANAEROBIC 10CC  Final   Culture   Final    KLEBSIELLA PNEUMONIAE Note: Culture results may be compromised due to an excessive volume of blood received in culture bottles. Gram Stain Report Called to,Read Back By and Verified With: Juanetta Beets 11/12/14 0144A [place] Performed at [facility]    Report Status 11/14/2014 FINAL  Final   Organism ID, Bacteria KLEBSIELLA PNEUMONIAE  Final      Susceptibility   Klebsiella pneumoniae - MIC*    AMPICILLIN RESISTANT      AMPICILLIN/SULBACTAM 4 SENSITIVE Sensitive     CEFAZOLIN <=4 SENSITIVE Sensitive     CEFEPIME <=1 SENSITIVE Sensitive     CEFTAZIDIME <=1 SENSITIVE Sensitive     CEFTRIAXONE <=1 SENSITIVE Sensitive     CIPROFLOXACIN <=0.25 SENSITIVE  Sensitive     GENTAMICIN  <=1 SENSITIVE Sensitive     IMIPENEM <=0.25 SENSITIVE Sensitive     PIP/TAZO <=4 SENSITIVE Sensitive     TOBRAMYCIN <=1 SENSITIVE Sensitive     TRIMETH/SULFA <=20 SENSITIVE Sensitive     * KLEBSIELLA PNEUMONIAE  Culture, blood (routine x 2)     Status: None   Collection Time: 11/08/14  5:35 PM  Result Value Ref Range Status   Specimen Description BLOOD ARM LEFT  Final   Special Requests BOTTLES DRAWN AEROBIC AND ANAEROBIC 10CC  Final   Culture   Final    KLEBSIELLA PNEUMONIAE Note: SUSCEPTIBILITIES PERFORMED ON PREVIOUS CULTURE WITHIN THE LAST 5 DAYS. Gram Stain Report Called to,Read Back By and Verified With: Iran Sizer RN @ [id number] [facility] 11/12/14 Performed at [facility]    Report Status 11/14/2014 FINAL  Final  Clostridium Difficile by PCR     Status: None   Collection Time: 11/12/14  8:41 AM  Result Value Ref Range Status   C difficile by pcr NEGATIVE NEGATIVE Final  Culture, blood (routine x 2)     Status: None (Preliminary result)   Collection Time: 11/12/14  8:04 PM  Result Value Ref Range Status   Specimen Description BLOOD LEFT ARM  Final   Special Requests BOTTLES DRAWN AEROBIC AND ANAEROBIC 10CC  Final   Culture   Final           BLOOD CULTURE RECEIVED NO GROWTH TO DATE CULTURE WILL BE HELD FOR 5 DAYS BEFORE ISSUING A FINAL NEGATIVE REPORT Note: Culture results may be compromised due to an excessive volume of blood received in culture bottles. Performed at [facility]    Report Status PENDING  Incomplete  Culture, blood (routine x 2)     Status: None (Preliminary result)   Collection Time: 11/12/14  8:19 PM  Result Value Ref Range Status   Specimen Description BLOOD LEFT ARM  Final   Special Requests   Final    BOTTLES DRAWN AEROBIC AND ANAEROBIC BLUE 10CC RED 5CC   Culture   Final           BLOOD CULTURE RECEIVED NO GROWTH TO DATE CULTURE WILL BE HELD FOR 5 DAYS BEFORE ISSUING A FINAL NEGATIVE REPORT Performed at [facility]    Report Status  PENDING  Incomplete  Body fluid culture     Status: None (Preliminary result)   Collection Time: 11/15/14 11:03 AM  Result Value Ref Range Status   Specimen Description FLUID HIP RIGHT SYNOVIAL  Final   Special Requests NONE  Final   Gram Stain   Final    RARE WBC PRESENT, PREDOMINANTLY MONONUCLEAR NO ORGANISMS SEEN Performed at [facility]    Culture   Final    NO GROWTH 1 DAY Performed at [facility]    Report Status PENDING  Incomplete    RADIOLOGY STUDIES/RESULTS: Ct Chest W Contrast  11/15/2014   CLINICAL DATA:  Pleural effusion. Left lower lobe opacity on chest radiograph. Increasing white count on appropriate antibiotics.  EXAM: CT CHEST WITH CONTRAST  TECHNIQUE: Multidetector CT imaging of the chest was performed during intravenous contrast administration.  CONTRAST:  77mL OMNIPAQUE IOHEXOL 300 MG/ML  SOLN  COMPARISON:  Chest 11/15/2014.  FINDINGS: Focal areas of consolidation in both lung bases, greater on the left, with small left pleural effusion. No evidence of loculated fluid to suggest empyema. Air bronchograms are present with suggestion of mild bronchiectasis. Findings likely  represent multifocal pneumonia. No pneumothorax.  Normal heart size. Normal caliber thoracic aorta. Great vessel origins are patent. No significant lymphadenopathy in the chest. Esophagus is mildly dilated with fluid and debris demonstrated throughout the esophagus. This may be due to reflux or dysmotility.  Included portions of the upper abdominal organs are grossly unremarkable. No destructive bone lesions.  IMPRESSION: Focal areas of consolidation in both lung bases, greater on the left. Small left pleural effusion. Findings likely to represent pneumonia. No evidence of empyema. Mildly dilated esophagus filled with fluid and debris. This could be due to reflux or dysmotility.   Electronically Signed   By: Lucienne Capers M.D.   On: 11/15/2014 22:08   Mr Pelvis W Wo Contrast  11/10/2014    CLINICAL DATA:  Quadriplegia. Sacral decubitus ulcer. Sepsis. Foul-smelling discharge from the decubitus ulcer.  EXAM: MRI PELVIS WITHOUT AND WITH CONTRAST  TECHNIQUE: Multiplanar multisequence MR imaging of the pelvis was performed both before and after administration of intravenous contrast.  CONTRAST:  88mL MULTIHANCE GADOBENATE DIMEGLUMINE 529 MG/ML IV SOLN  COMPARISON:  10/02/2014  FINDINGS: Again noted are decubitus ulcers over the ischial tuberosities, right larger than left. The right decubitus ulcer extends down to the ischial tuberosity with underlying marrow edema and cortical irregularity consistent with osteomyelitis. There is mild diffuse edema of the pelvic musculature with mild enhancement. There are interval development of multiple small nonenhancing fluid collections within the right adductor magnus and adductor brevis muscles extending from the right ischial tuberosity most consistent with intramuscular abscesses. The conglomeration of the area measures approximately 4.8 x 2 cm, but each individual collection measures significantly smaller than this. There is a rind of nonenhancement around the wound consistent with necrosis.  There is no marrow signal abnormality involving the left ischial tuberosity.  Bilateral hips demonstrate no fracture, dislocation or avascular necrosis. There is a small right hip joint effusion with mild synovial enhancement. There is no bursa formation. There is generalized edema within the subcutaneous fat throughout the pelvis.  IMPRESSION: 1. Again noted is a decubitus ulcer over the ischial tuberosities bilaterally, right larger than left. The right decubitus ulcer extends down to the ischial tuberosity with underlying marrow edema, cortical irregularity and enhancement consistent with osteomyelitis similar in appearance to the prior exam. There is mild diffuse edema throughout the pelvic musculature with mild enhancement with interval development of small  nonenhancing fluid collections within the right adductor magnus and adductor brevis muscles extending from the right ischial tuberosity most consistent with intramuscular abscesses. 2. Small right joint effusion with mild synovial enhancement. Septic arthritis cannot be excluded. If there is clinical concern recommend arthrocentesis. 3. Decubitus ulcer overlying the left ischial tuberosity without underlying osseous abnormality. 4. There is diffuse mild edema of the pelvic musculature which is likely neurogenic. There is a concern of an element of infectious myositis especially in the right adductor and gluteal compartment.   Electronically Signed   By: Kathreen Devoid   On: 11/10/2014 17:12   Dg Chest Port 1 View  11/15/2014   CLINICAL DATA:  Subsequent encounter for pneumonia.  EXAM: PORTABLE CHEST - 1 VIEW  COMPARISON:  11/08/2014  FINDINGS: The right PICC line is stable. The tip is near the cavoatrial junction. The cardiac silhouette, mediastinal and hilar contours are within normal limits and stable. Persistent left lower lobe process, likely pneumonia and small parapneumonic effusion. The right lung remains clear.  IMPRESSION: Persistent left lower lobe process likely a combination of infiltrate and effusion.  Electronically Signed   By: Kalman Jewels M.D.   On: 11/15/2014 12:47   Dg Chest Port 1 View  11/08/2014   CLINICAL DATA:  Cough, elevated white count  EXAM: PORTABLE CHEST - 1 VIEW  COMPARISON:  10/10/2014  FINDINGS: Consolidation in the left lower lobe, similar to prior study. Increasing right medial basilar airspace opacity also noted. Findings compatible with pneumonia. No effusions. Right PICC are remains in place, unchanged.  IMPRESSION: Stable left lower lobe pneumonia. Increasing right medial basilar density, also likely pneumonia.   Electronically Signed   By: Rolm Baptise M.D.   On: 11/08/2014 18:48   Dg Hand Complete Left  11/17/2014   CLINICAL DATA:  Nonhealing ulcer in left middle  finger.  EXAM: LEFT HAND - COMPLETE 3+ VIEW  COMPARISON:  None.  FINDINGS: There is no evidence of fracture or dislocation. No lytic destruction is seen to suggest osteomyelitis. There is no evidence of arthropathy or other focal bone abnormality. Vascular calcifications are noted.  IMPRESSION: No significant abnormality seen in the left hand.   Electronically Signed   By: Marijo Conception, M.D.   On: 11/17/2014 11:49   Dg Foot Complete Left  11/17/2014   CLINICAL DATA:  Nonhealing diabetic ulcers.  EXAM: LEFT FOOT - COMPLETE 3+ VIEW  COMPARISON:  None.  FINDINGS: There is no evidence of fracture or dislocation. There is no evidence of arthropathy or other focal bone abnormality. Vascular calcifications are noted. No lytic destruction is seen to suggest osteomyelitis.  IMPRESSION: No evidence of osteomyelitis.   Electronically Signed   By: Marijo Conception, M.D.   On: 11/17/2014 11:58   Dg Foot Complete Right  11/17/2014   CLINICAL DATA:  Diabetic ulcers.  EXAM: RIGHT FOOT COMPLETE - 3+ VIEW  COMPARISON:  None.  FINDINGS: There is no evidence of fracture or dislocation. There is no evidence of arthropathy or other focal bone abnormality. Vascular calcifications are noted. No radiopaque foreign body is noted. No lytic destruction is seen to suggest osteomyelitis.  IMPRESSION: No acute abnormality seen in the right foot.   Electronically Signed   By: Marijo Conception, M.D.   On: 11/17/2014 11:55   Dg Fluoro Guide Ndl Plc/bx  11/15/2014   CLINICAL DATA:  RIGHT HIP ASPIRATION INFECTION. PATIENT HAS DECUBITUS ULCERS.  EXAM: RIGHT HIP ASPIRATION UNDER FLUOROSCOPY  FLUOROSCOPY TIME:  If the device does not provide the exposure index:  Fluoroscopy Time (in minutes and seconds):  7 SECONDS  Number of Acquired Images:  0  PROCEDURE: Overlying skin prepped with Betadine, draped in the usual sterile fashion, and infiltrated locally with buffered Lidocaine. Curved 22 gauge spinal needle advanced to the superolateral margin  of the RIGHT femoral head. Diagnostic injection of iodinated contrast demonstrates intra-articular spread without intravascular component.  Aspiration was attempted with no return of fluid. Lavage of the right hip joint was performed with 5 mL of sterile saline with subsequent aspiration of 1 mL of saline from the right hip joint. The spinal needle was removed.  The patient tolerated the procedure well. There were no immediate complications. Hemostasis was achieved.  IMPRESSION: Technically successful right hip aspiration under fluoroscopy. 1 mL of clear right hip joint aspirate was sent for laboratory analysis.   Electronically Signed   By: Kathreen Devoid   On: 11/15/2014 11:34    Oren Binet, MD  [facility] Pager:[phone]  If 7PM-7AM, please contact night-coverage [email or web address] Password [email or web address] 11/17/2014, 2:41 PM   LOS: 9  days

## 2014-11-17 NOTE — Progress Notes (Signed)
5 Days Post-Op  Subjective: The patient is in the bed with no complaints at present.  VAC is working and has a seal.  Objective: Vital signs in last 24 hours: Temp:  [98.2 F (36.8 C)-98.3 F (36.8 C)] 98.2 F (36.8 C) (02/10 0825) Pulse Rate:  [83-109] 85 (02/10 0825) Resp:  [12-18] 17 (02/10 0825) BP: (106-114)/(64-78) 106/64 mmHg (02/10 0825) SpO2:  [100 %] 100 % (02/10 0825) Weight:  [68.221 kg (150 lb 6.4 oz)] 68.221 kg (150 lb 6.4 oz) (02/10 0500) Last BM Date: 11/16/14  Intake/Output from previous day: 02/09 0701 - 02/10 0700 In: 2100 [P.O.:100; I.V.:1500; IV Piggyback:500] Out: 4600 [Urine:3200; BSWHQ:7591] Intake/Output this shift:    General appearance: alert, cooperative and no distress Incision/Wound: VAC in place.  Lab Results:   Recent Labs  11/15/14 0225 11/16/14 0500  WBC 28.5* 23.1*  HGB 8.5* 8.2*  HCT 26.3* 25.4*  PLT 582* 520*   BMET  Recent Labs  11/15/14 0225 11/15/14 1917  NA 131* 130*  K 5.5* 5.3*  CL 104 104  CO2 23 24  GLUCOSE 174* 153*  BUN 11 14  CREATININE 0.75 0.83  CALCIUM 9.9 10.0   PT/INR No results for input(s): LABPROT, INR in the last 72 hours. ABG No results for input(s): PHART, HCO3 in the last 72 hours.  Invalid input(s): PCO2, PO2  Studies/Results: Ct Chest W Contrast  11/15/2014   CLINICAL DATA:  Pleural effusion. Left lower lobe opacity on chest radiograph. Increasing white count on appropriate antibiotics.  EXAM: CT CHEST WITH CONTRAST  TECHNIQUE: Multidetector CT imaging of the chest was performed during intravenous contrast administration.  CONTRAST:  70mL OMNIPAQUE IOHEXOL 300 MG/ML  SOLN  COMPARISON:  Chest 11/15/2014.  FINDINGS: Focal areas of consolidation in both lung bases, greater on the left, with small left pleural effusion. No evidence of loculated fluid to suggest empyema. Air bronchograms are present with suggestion of mild bronchiectasis. Findings likely represent multifocal pneumonia. No  pneumothorax.  Normal heart size. Normal caliber thoracic aorta. Great vessel origins are patent. No significant lymphadenopathy in the chest. Esophagus is mildly dilated with fluid and debris demonstrated throughout the esophagus. This may be due to reflux or dysmotility.  Included portions of the upper abdominal organs are grossly unremarkable. No destructive bone lesions.  IMPRESSION: Focal areas of consolidation in both lung bases, greater on the left. Small left pleural effusion. Findings likely to represent pneumonia. No evidence of empyema. Mildly dilated esophagus filled with fluid and debris. This could be due to reflux or dysmotility.   Electronically Signed   By: Lucienne Capers M.D.   On: 11/15/2014 22:08   Dg Chest Port 1 View  11/15/2014   CLINICAL DATA:  Subsequent encounter for pneumonia.  EXAM: PORTABLE CHEST - 1 VIEW  COMPARISON:  11/08/2014  FINDINGS: The right PICC line is stable. The tip is near the cavoatrial junction. The cardiac silhouette, mediastinal and hilar contours are within normal limits and stable. Persistent left lower lobe process, likely pneumonia and small parapneumonic effusion. The right lung remains clear.  IMPRESSION: Persistent left lower lobe process likely a combination of infiltrate and effusion.   Electronically Signed   By: Kalman Jewels M.D.   On: 11/15/2014 12:47    Anti-infectives: Anti-infectives    Start     Dose/Rate Route Frequency Ordered Stop   11/16/14 1000  vancomycin (VANCOCIN) IVPB 750 mg/150 ml premix     750 mg 150 mL/hr over 60 Minutes Intravenous Every 12  hours 11/16/14 0957     11/15/14 1700  vancomycin (VANCOCIN) IVPB 750 mg/150 ml premix  Status:  Discontinued     750 mg 150 mL/hr over 60 Minutes Intravenous Every 8 hours 11/15/14 1610 11/16/14 0957   11/12/14 1317  polymyxin B 500,000 Units, bacitracin 50,000 Units in sodium chloride irrigation 0.9 % 500 mL irrigation  Status:  Discontinued       As needed 11/12/14 1319 11/12/14  1417   11/09/14 1400  linezolid (ZYVOX) tablet 600 mg  Status:  Discontinued     600 mg Oral Every 12 hours 11/09/14 1319 11/15/14 1542   11/09/14 0700  vancomycin (VANCOCIN) 500 mg in sodium chloride 0.9 % 100 mL IVPB  Status:  Discontinued     500 mg 100 mL/hr over 60 Minutes Intravenous Every 12 hours 11/08/14 1732 11/08/14 1737   11/08/14 2200  vancomycin (VANCOCIN) IVPB 750 mg/150 ml premix  Status:  Discontinued     750 mg 150 mL/hr over 60 Minutes Intravenous Every 24 hours 11/08/14 1737 11/09/14 1319   11/08/14 1800  meropenem (MERREM) 1 g in sodium chloride 0.9 % 100 mL IVPB     1 g 200 mL/hr over 30 Minutes Intravenous Every 8 hours 11/08/14 1732     11/08/14 1745  vancomycin (VANCOCIN) 1,250 mg in sodium chloride 0.9 % 250 mL IVPB  Status:  Discontinued     1,250 mg 166.7 mL/hr over 90 Minutes Intravenous  Once 11/08/14 1732 11/08/14 1734      Assessment/Plan: s/p Procedure(s): IRRIGATION AND DEBRIDEMENT OF WOUNDS WITH BONE BIOPSY AND SURGICAL PREP  (N/A) APPLICATION A CELL AND VAC  (N/A) Plan for OR for VAC change.  LOS: 9 days    [facility] 11/17/2014

## 2014-11-18 ENCOUNTER — Encounter (HOSPITAL_COMMUNITY)
Admission: EM | Disposition: A | Discharge status: Home / Self Care | Payer: Self-pay | Source: Home / Self Care | Attending: Internal Medicine

## 2014-11-18 ENCOUNTER — Inpatient Hospital Stay (HOSPITAL_COMMUNITY): Payer: Medicaid Other | Admitting: Critical Care Medicine

## 2014-11-18 ENCOUNTER — Encounter (HOSPITAL_COMMUNITY): Payer: Self-pay | Admitting: Plastic Surgery

## 2014-11-18 DIAGNOSIS — J690 Pneumonitis due to inhalation of food and vomit: Secondary | ICD-10-CM

## 2014-11-18 HISTORY — PX: INCISION AND DRAINAGE OF WOUND: SHX1803

## 2014-11-18 LAB — GLUCOSE, CAPILLARY
GLUCOSE-CAPILLARY: 226 mg/dL — AB (ref 70–99)
GLUCOSE-CAPILLARY: 274 mg/dL — AB (ref 70–99)
GLUCOSE-CAPILLARY: 278 mg/dL — AB (ref 70–99)
Glucose-Capillary: 127 mg/dL — ABNORMAL HIGH (ref 70–99)

## 2014-11-18 LAB — CBC WITH DIFFERENTIAL/PLATELET
BASOS PCT: 0 % (ref 0–1)
Basophils Absolute: 0.1 10*3/uL (ref 0.0–0.1)
EOS ABS: 0.6 10*3/uL (ref 0.0–0.7)
EOS PCT: 3 % (ref 0–5)
HEMATOCRIT: 25 % — AB (ref 39.0–52.0)
Hemoglobin: 8 g/dL — ABNORMAL LOW (ref 13.0–17.0)
LYMPHS ABS: 4.1 10*3/uL — AB (ref 0.7–4.0)
Lymphocytes Relative: 20 % (ref 12–46)
MCH: 26.7 pg (ref 26.0–34.0)
MCHC: 32 g/dL (ref 30.0–36.0)
MCV: 83.3 fL (ref 78.0–100.0)
Monocytes Absolute: 1.7 10*3/uL — ABNORMAL HIGH (ref 0.1–1.0)
Monocytes Relative: 9 % (ref 3–12)
NEUTROS PCT: 68 % (ref 43–77)
Neutro Abs: 13.6 10*3/uL — ABNORMAL HIGH (ref 1.7–7.7)
PLATELETS: 572 10*3/uL — AB (ref 150–400)
RBC: 3 MIL/uL — ABNORMAL LOW (ref 4.22–5.81)
RDW: 16.8 % — ABNORMAL HIGH (ref 11.5–15.5)
WBC: 20 10*3/uL — AB (ref 4.0–10.5)

## 2014-11-18 LAB — VANCOMYCIN, TROUGH: Vancomycin Tr: 29.4 ug/mL (ref 10.0–20.0)

## 2014-11-18 LAB — HEMOGLOBIN A1C
HEMOGLOBIN A1C: 7.9 % — AB (ref 4.8–5.6)
Mean Plasma Glucose: 180 mg/dL

## 2014-11-18 SURGERY — IRRIGATION AND DEBRIDEMENT WOUND
Anesthesia: General | Site: Coccyx

## 2014-11-18 MED ORDER — LORAZEPAM 0.5 MG PO TABS
0.5000 mg | ORAL_TABLET | Freq: Once | ORAL | Status: AC
  Administered 2014-11-18: 0.5 mg via ORAL
  Filled 2014-11-18: qty 1

## 2014-11-18 MED ORDER — ONDANSETRON HCL 4 MG/2ML IJ SOLN
INTRAMUSCULAR | Status: AC
  Filled 2014-11-18: qty 2

## 2014-11-18 MED ORDER — FENTANYL CITRATE 0.05 MG/ML IJ SOLN
INTRAMUSCULAR | Status: AC
  Filled 2014-11-18: qty 5

## 2014-11-18 MED ORDER — FENTANYL 25 MCG/HR TD PT72
25.0000 ug | MEDICATED_PATCH | TRANSDERMAL | Status: DC
  Administered 2014-11-19: 25 ug via TRANSDERMAL
  Filled 2014-11-18: qty 1

## 2014-11-18 MED ORDER — MIDAZOLAM HCL 2 MG/2ML IJ SOLN
INTRAMUSCULAR | Status: AC
  Filled 2014-11-18: qty 2

## 2014-11-18 MED ORDER — ONDANSETRON HCL 4 MG/2ML IJ SOLN
INTRAMUSCULAR | Status: DC | PRN
  Administered 2014-11-18: 4 mg via INTRAVENOUS

## 2014-11-18 MED ORDER — PREGABALIN 100 MG PO CAPS
200.0000 mg | ORAL_CAPSULE | Freq: Every day | ORAL | Status: DC
  Administered 2014-11-19: 200 mg via ORAL
  Filled 2014-11-18: qty 2

## 2014-11-18 MED ORDER — NEOSTIGMINE METHYLSULFATE 10 MG/10ML IV SOLN
INTRAVENOUS | Status: AC
  Filled 2014-11-18: qty 1

## 2014-11-18 MED ORDER — PROPOFOL 10 MG/ML IV BOLUS
INTRAVENOUS | Status: DC | PRN
  Administered 2014-11-18: 150 mg via INTRAVENOUS

## 2014-11-18 MED ORDER — GLYCOPYRROLATE 0.2 MG/ML IJ SOLN
INTRAMUSCULAR | Status: AC
  Filled 2014-11-18: qty 3

## 2014-11-18 MED ORDER — PHENYLEPHRINE 40 MCG/ML (10ML) SYRINGE FOR IV PUSH (FOR BLOOD PRESSURE SUPPORT)
PREFILLED_SYRINGE | INTRAVENOUS | Status: AC
  Filled 2014-11-18: qty 10

## 2014-11-18 MED ORDER — GLYCOPYRROLATE 0.2 MG/ML IJ SOLN
INTRAMUSCULAR | Status: DC | PRN
  Administered 2014-11-18: 0.6 mg via INTRAVENOUS

## 2014-11-18 MED ORDER — SODIUM CHLORIDE 0.9 % IV SOLN
1.0000 g | Freq: Three times a day (TID) | INTRAVENOUS | Status: DC
  Administered 2014-11-19 (×2): 1 g via INTRAVENOUS
  Filled 2014-11-18 (×3): qty 1

## 2014-11-18 MED ORDER — BACLOFEN 20 MG PO TABS
20.0000 mg | ORAL_TABLET | Freq: Every day | ORAL | Status: DC
  Administered 2014-11-19: 20 mg via ORAL
  Filled 2014-11-18 (×2): qty 1

## 2014-11-18 MED ORDER — LACTATED RINGERS IV SOLN
INTRAVENOUS | Status: DC | PRN
  Administered 2014-11-18: 17:00:00 via INTRAVENOUS

## 2014-11-18 MED ORDER — VANCOMYCIN HCL IN DEXTROSE 1-5 GM/200ML-% IV SOLN
1000.0000 mg | INTRAVENOUS | Status: DC
  Administered 2014-11-19: 1000 mg via INTRAVENOUS
  Filled 2014-11-18 (×2): qty 200

## 2014-11-18 MED ORDER — FENTANYL CITRATE 0.05 MG/ML IJ SOLN
INTRAMUSCULAR | Status: DC | PRN
  Administered 2014-11-18: 75 ug via INTRAVENOUS

## 2014-11-18 MED ORDER — PHENYLEPHRINE HCL 10 MG/ML IJ SOLN
INTRAMUSCULAR | Status: DC | PRN
  Administered 2014-11-18 (×2): 80 ug via INTRAVENOUS

## 2014-11-18 MED ORDER — LIDOCAINE HCL (CARDIAC) 20 MG/ML IV SOLN
INTRAVENOUS | Status: DC | PRN
  Administered 2014-11-18: 70 mg via INTRAVENOUS

## 2014-11-18 MED ORDER — DOUBLE ANTIBIOTIC 500-10000 UNIT/GM EX OINT
TOPICAL_OINTMENT | CUTANEOUS | Status: AC
  Filled 2014-11-18: qty 1

## 2014-11-18 MED ORDER — LIDOCAINE HCL (CARDIAC) 20 MG/ML IV SOLN
INTRAVENOUS | Status: AC
Start: 2014-11-18 — End: 2014-11-18
  Filled 2014-11-18: qty 5

## 2014-11-18 MED ORDER — ROCURONIUM BROMIDE 100 MG/10ML IV SOLN
INTRAVENOUS | Status: DC | PRN
  Administered 2014-11-18: 30 mg via INTRAVENOUS

## 2014-11-18 MED ORDER — SODIUM CHLORIDE 0.9 % IR SOLN
Status: DC | PRN
  Administered 2014-11-18: 1000 mL

## 2014-11-18 MED ORDER — ONDANSETRON HCL 4 MG/2ML IJ SOLN: 4.0000 mg | Freq: Four times a day (QID) | INTRAMUSCULAR | Status: DC | PRN

## 2014-11-18 MED ORDER — ROCURONIUM BROMIDE 50 MG/5ML IV SOLN
INTRAVENOUS | Status: AC
  Filled 2014-11-18: qty 1

## 2014-11-18 MED ORDER — MIDAZOLAM HCL 5 MG/5ML IJ SOLN
INTRAMUSCULAR | Status: DC | PRN
  Administered 2014-11-18: 1 mg via INTRAVENOUS

## 2014-11-18 MED ORDER — FENTANYL CITRATE 0.05 MG/ML IJ SOLN
25.0000 ug | INTRAMUSCULAR | Status: DC | PRN
  Administered 2014-11-18 (×2): 50 ug via INTRAVENOUS

## 2014-11-18 MED ORDER — FENTANYL CITRATE 0.05 MG/ML IJ SOLN
INTRAMUSCULAR | Status: AC
  Administered 2014-11-18: 50 ug via INTRAVENOUS
  Filled 2014-11-18: qty 2

## 2014-11-18 MED ORDER — NEOSTIGMINE METHYLSULFATE 10 MG/10ML IV SOLN
INTRAVENOUS | Status: DC | PRN
  Administered 2014-11-18: 4 mg via INTRAVENOUS

## 2014-11-18 MED ORDER — ARTIFICIAL TEARS OP OINT
TOPICAL_OINTMENT | OPHTHALMIC | Status: AC
  Filled 2014-11-18: qty 3.5

## 2014-11-18 MED ORDER — INSULIN GLARGINE 100 UNIT/ML ~~LOC~~ SOLN
25.0000 [IU] | Freq: Every day | SUBCUTANEOUS | Status: DC
  Administered 2014-11-19: 25 [IU] via SUBCUTANEOUS
  Filled 2014-11-18 (×2): qty 0.25

## 2014-11-18 SURGICAL SUPPLY — 45 items
BAG DECANTER FOR FLEXI CONT (MISCELLANEOUS) IMPLANT
BENZOIN TINCTURE PRP APPL 2/3 (GAUZE/BANDAGES/DRESSINGS) ×3 IMPLANT
BLADE 10 SAFETY STRL DISP (BLADE) IMPLANT
BLADE SURG ROTATE 9660 (MISCELLANEOUS) IMPLANT
BNDG GAUZE ELAST 4 BULKY (GAUZE/BANDAGES/DRESSINGS) IMPLANT
CANISTER SUCTION 2500CC (MISCELLANEOUS) ×3 IMPLANT
CANISTER WOUND CARE 500ML ATS (WOUND CARE) ×3 IMPLANT
CHLORAPREP W/TINT 26ML (MISCELLANEOUS) IMPLANT
CONT SPEC STER OR (MISCELLANEOUS) IMPLANT
COVER SURGICAL LIGHT HANDLE (MISCELLANEOUS) ×3 IMPLANT
DRAPE IMP U-DRAPE 54X76 (DRAPES) ×3 IMPLANT
DRAPE INCISE IOBAN 66X45 STRL (DRAPES) ×6 IMPLANT
DRAPE PED LAPAROTOMY (DRAPES) IMPLANT
DRAPE PROXIMA HALF (DRAPES) IMPLANT
DRSG ADAPTIC 3X8 NADH LF (GAUZE/BANDAGES/DRESSINGS) ×6 IMPLANT
DRSG PAD ABDOMINAL 8X10 ST (GAUZE/BANDAGES/DRESSINGS) ×6 IMPLANT
DRSG VAC ATS LRG SENSATRAC (GAUZE/BANDAGES/DRESSINGS) IMPLANT
DRSG VAC ATS MED SENSATRAC (GAUZE/BANDAGES/DRESSINGS) ×3 IMPLANT
DRSG VAC ATS SM SENSATRAC (GAUZE/BANDAGES/DRESSINGS) IMPLANT
ELECT CAUTERY BLADE 6.4 (BLADE) ×3 IMPLANT
ELECT REM PT RETURN 9FT ADLT (ELECTROSURGICAL) ×3
ELECTRODE REM PT RTRN 9FT ADLT (ELECTROSURGICAL) ×1 IMPLANT
GAUZE SPONGE 4X4 12PLY STRL (GAUZE/BANDAGES/DRESSINGS) ×6 IMPLANT
GAUZE XEROFORM 5X9 LF (GAUZE/BANDAGES/DRESSINGS) ×6 IMPLANT
GLOVE BIO SURGEON STRL SZ 6.5 (GLOVE) ×2 IMPLANT
GLOVE BIO SURGEONS STRL SZ 6.5 (GLOVE) ×1
GOWN STRL REUS W/ TWL LRG LVL3 (GOWN DISPOSABLE) ×5 IMPLANT
GOWN STRL REUS W/TWL LRG LVL3 (GOWN DISPOSABLE) ×10
KIT BASIN OR (CUSTOM PROCEDURE TRAY) ×3 IMPLANT
KIT ROOM TURNOVER OR (KITS) ×3 IMPLANT
MATRIX SURGICAL PSM 7X10CM (Tissue) ×3 IMPLANT
MICROMATRIX 1000MG (Tissue) ×3 IMPLANT
NS IRRIG 1000ML POUR BTL (IV SOLUTION) ×3 IMPLANT
PACK GENERAL/GYN (CUSTOM PROCEDURE TRAY) ×3 IMPLANT
PACK UNIVERSAL I (CUSTOM PROCEDURE TRAY) ×3 IMPLANT
PAD ARMBOARD 7.5X6 YLW CONV (MISCELLANEOUS) ×6 IMPLANT
SOLUTION PARTIC MCRMTRX 1000MG (Tissue) ×1 IMPLANT
SURGILUBE 2OZ TUBE FLIPTOP (MISCELLANEOUS) IMPLANT
SUT VIC AB 5-0 PS2 18 (SUTURE) ×3 IMPLANT
SWAB COLLECTION DEVICE MRSA (MISCELLANEOUS) IMPLANT
TAPE CLOTH SURG 6X10 WHT LF (GAUZE/BANDAGES/DRESSINGS) ×3 IMPLANT
TOWEL OR 17X24 6PK STRL BLUE (TOWEL DISPOSABLE) ×3 IMPLANT
TOWEL OR 17X26 10 PK STRL BLUE (TOWEL DISPOSABLE) ×3 IMPLANT
TUBE ANAEROBIC SPECIMEN COL (MISCELLANEOUS) IMPLANT
UNDERPAD 30X30 INCONTINENT (UNDERPADS AND DIAPERS) IMPLANT

## 2014-11-18 NOTE — Anesthesia Preprocedure Evaluation (Addendum)
Anesthesia Evaluation  Patient identified by MRN, date of birth, ID band Patient awake    Reviewed: Allergy & Precautions, NPO status , Patient's Chart, lab work & pertinent test results  Airway Mallampati: II  TM Distance: >3 FB     Dental  (+) Dental Advisory Given, Poor Dentition   Pulmonary asthma , Current Smoker,  Pt states that he has an inhaler but does not use it, that he does not often need it.  Unable to smoke since November. breath sounds clear to auscultation        Cardiovascular Rhythm:regular     Neuro/Psych Seizures -,  PSYCHIATRIC DISORDERS CVA    GI/Hepatic GERD-  Controlled and Medicated,  Endo/Other  diabetes, Poorly Controlled, Insulin Dependent  Renal/GU      Musculoskeletal  (+) Arthritis -, Fibromyalgia -  Abdominal   Peds  Hematology  (+) anemia ,   Anesthesia Other Findings Recent multiple extractions and several broken and decayed teeth still remain.  Pt denies that any teeth are loose.  Reproductive/Obstetrics                          Anesthesia Physical Anesthesia Plan  ASA: III  Anesthesia Plan: General   Post-op Pain Management:    Induction: Intravenous  Airway Management Planned: Oral ETT  Additional Equipment:   Intra-op Plan:   Post-operative Plan: Extubation in OR  Informed Consent: I have reviewed the patients History and Physical, chart, labs and discussed the procedure including the risks, benefits and alternatives for the proposed anesthesia with the patient or authorized representative who has indicated his/her understanding and acceptance.   Dental advisory given  Plan Discussed with: Anesthesiologist and Surgeon  Anesthesia Plan Comments:         Anesthesia Quick Evaluation

## 2014-11-18 NOTE — Progress Notes (Signed)
[facility] hospitalist notified that pt had a request for Ativan for anxiety. New order was given will continue to monitor. Arthor Captain LPN

## 2014-11-18 NOTE — Anesthesia Procedure Notes (Signed)
Procedure Name: Intubation Date/Time: 11/18/2014 3:38 PM Performed by: Williemae Area B Pre-anesthesia Checklist: Patient identified, Emergency Drugs available, Suction available and Patient being monitored Patient Re-evaluated:Patient Re-evaluated prior to inductionOxygen Delivery Method: Circle system utilized Preoxygenation: Pre-oxygenation with 100% oxygen Intubation Type: IV induction Ventilation: Mask ventilation without difficulty Laryngoscope Size: Mac and 3 Grade View: Grade II Tube type: Oral Tube size: 7.5 mm Number of attempts: 1 Airway Equipment and Method: Stylet Placement Confirmation: ETT inserted through vocal cords under direct vision,  breath sounds checked- equal and bilateral and positive ETCO2 Secured at: 22 (cm at teeth) cm Tube secured with: Tape Dental Injury: Teeth and Oropharynx as per pre-operative assessment

## 2014-11-18 NOTE — Progress Notes (Signed)
Inpatient Diabetes Program Recommendations  AACE/ADA: New Consensus Statement on Inpatient Glycemic Control (2013)  Target Ranges:  Prepandial:   less than 140 mg/dL      Peak postprandial:   less than 180 mg/dL (1-2 hours)      Critically ill patients:  140 - 180 mg/dL  Results for JAVION, HOLMER (MRN [id number]) as of 11/18/2014 13:27  Ref. Range 11/17/2014 16:54 11/17/2014 22:28 11/18/2014 08:00 11/18/2014 12:01  Glucose-Capillary Latest Range: 70-99 mg/dL 204 (H) 253 (H) 278 (H) 226 (H)   Inpatient Diabetes Program Recommendations Correction (SSI): increase to Moderate scale correction  Thank you  Raoul Pitch BSN, RN,CDE Inpatient Diabetes Coordinator [phone] (team pager)

## 2014-11-18 NOTE — Progress Notes (Addendum)
ANTIBIOTIC CONSULT NOTE - INITIAL  Pharmacy Consult for vancomycin Indication: osteomyelitis  Allergies  Allergen Reactions  . Cefuroxime Axetil Anaphylaxis  . Morphine And Related Other (See Comments)    Changed mental status, confusion, headache, visual hallucination  . Penicillins Anaphylaxis and Other (See Comments)    ?can take amoxicillin?  . Sulfa Antibiotics Anaphylaxis, Shortness Of Breath and Other (See Comments)  . Tessalon [Benzonatate] Anaphylaxis  . Shellfish Allergy Itching and Other (See Comments)    Took benadryl to alleviate reaction    Patient Measurements: Height: 5\' 8"  (172.7 cm) Weight: 152 lb 11.2 oz (69.264 kg) IBW/kg (Calculated) : 68.4 Adjusted Body Weight:   Vital Signs: Temp: 99.3 F (37.4 C) (02/11 0723) Temp Source: Oral (02/11 0723) BP: 122/76 mmHg (02/11 0723) Pulse Rate: 91 (02/11 0723) Intake/Output from previous day: 02/10 0701 - 02/11 0700 In: 1800.8 [I.V.:1000.8; IV Piggyback:800] Out: 5035 [Urine:2800; Stool:2175] Intake/Output from this shift: Total I/O In: -  Out: 600 [Urine:350; Stool:250]  Labs:  Recent Labs  11/15/14 1917 11/16/14 0500 11/18/14 0748  WBC  --  23.1* 20.0*  HGB  --  8.2* 8.0*  PLT  --  520* 572*  CREATININE 0.83  --   --    Estimated Creatinine Clearance: 125.9 mL/min (by C-G formula based on Cr of 0.83).  Recent Labs  11/18/14 1054  VANCOTROUGH 29.4*     Medical History: Past Medical History  Diagnosis Date  . GERD (gastroesophageal reflux disease)   . Asthma   . Hx MRSA infection     on face  . Gastroparesis   . Diabetic neuropathy   . Seizures   . Stroke     spinal stroke in 4/15  . Diabetes mellitus     sees Dr. Loanne Drilling   . Family history of anesthesia complication     Pt mother can't have epidural procedures  . Dysrhythmia   . Pneumonia   . Arthritis   . 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  WBC remains elevated at 20, afebrile. Most recent Scr holding stable, ordered bmp for tomorrow. Vancomycin trough was drawn late and still returned fairly SUPRAtherapeutic.  Pt rec'd part of next dose (~half) before infusion was terminated.  2/1 UCx - BURKHOLDERIA CEPACIA, sens ceftaz (ID not sure if true pathogen given paraplegia)  2/1 BCx2- Klebsiella, pan S except for amp 2/5 BCx2- ngtd 2/8 Synovial fluid cx - no result 2/9 BCx2- ngtd  Zyvox 2/2 > 2/8 Meropenem 2/1 > Vanc PTA >> 2/1, resumed 2/9 >  Goal of Therapy:  Vancomycin trough level 15-20 mcg/ml  Plan:  Continue meropenem 1 g IV q8h Reduce vancomycin to 1 g IV q24h Check vanc trough at steady state F/u BMP tomorrow     Hughes Better, PharmD, BCPS Clinical Pharmacist Pager: [phone] 11/18/2014 12:49 PM

## 2014-11-18 NOTE — H&P (View-Only) (Signed)
5 Days Post-Op  Subjective: The patient is in the bed with no complaints at present.  VAC is working and has a seal.  Objective: Vital signs in last 24 hours: Temp:  [98.2 F (36.8 C)-98.3 F (36.8 C)] 98.2 F (36.8 C) (02/10 0825) Pulse Rate:  [83-109] 85 (02/10 0825) Resp:  [12-18] 17 (02/10 0825) BP: (106-114)/(64-78) 106/64 mmHg (02/10 0825) SpO2:  [100 %] 100 % (02/10 0825) Weight:  [68.221 kg (150 lb 6.4 oz)] 68.221 kg (150 lb 6.4 oz) (02/10 0500) Last BM Date: 11/16/14  Intake/Output from previous day: 02/09 0701 - 02/10 0700 In: 2100 [P.O.:100; I.V.:1500; IV Piggyback:500] Out: 4600 [Urine:3200; HOZYY:4825] Intake/Output this shift:    General appearance: alert, cooperative and no distress Incision/Wound: VAC in place.  Lab Results:   Recent Labs  11/15/14 0225 11/16/14 0500  WBC 28.5* 23.1*  HGB 8.5* 8.2*  HCT 26.3* 25.4*  PLT 582* 520*   BMET  Recent Labs  11/15/14 0225 11/15/14 1917  NA 131* 130*  K 5.5* 5.3*  CL 104 104  CO2 23 24  GLUCOSE 174* 153*  BUN 11 14  CREATININE 0.75 0.83  CALCIUM 9.9 10.0   PT/INR No results for input(s): LABPROT, INR in the last 72 hours. ABG No results for input(s): PHART, HCO3 in the last 72 hours.  Invalid input(s): PCO2, PO2  Studies/Results: Ct Chest W Contrast  11/15/2014   CLINICAL DATA:  Pleural effusion. Left lower lobe opacity on chest radiograph. Increasing white count on appropriate antibiotics.  EXAM: CT CHEST WITH CONTRAST  TECHNIQUE: Multidetector CT imaging of the chest was performed during intravenous contrast administration.  CONTRAST:  2mL OMNIPAQUE IOHEXOL 300 MG/ML  SOLN  COMPARISON:  Chest 11/15/2014.  FINDINGS: Focal areas of consolidation in both lung bases, greater on the left, with small left pleural effusion. No evidence of loculated fluid to suggest empyema. Air bronchograms are present with suggestion of mild bronchiectasis. Findings likely represent multifocal pneumonia. No  pneumothorax.  Normal heart size. Normal caliber thoracic aorta. Great vessel origins are patent. No significant lymphadenopathy in the chest. Esophagus is mildly dilated with fluid and debris demonstrated throughout the esophagus. This may be due to reflux or dysmotility.  Included portions of the upper abdominal organs are grossly unremarkable. No destructive bone lesions.  IMPRESSION: Focal areas of consolidation in both lung bases, greater on the left. Small left pleural effusion. Findings likely to represent pneumonia. No evidence of empyema. Mildly dilated esophagus filled with fluid and debris. This could be due to reflux or dysmotility.   Electronically Signed   By: Lucienne Capers M.D.   On: 11/15/2014 22:08   Dg Chest Port 1 View  11/15/2014   CLINICAL DATA:  Subsequent encounter for pneumonia.  EXAM: PORTABLE CHEST - 1 VIEW  COMPARISON:  11/08/2014  FINDINGS: The right PICC line is stable. The tip is near the cavoatrial junction. The cardiac silhouette, mediastinal and hilar contours are within normal limits and stable. Persistent left lower lobe process, likely pneumonia and small parapneumonic effusion. The right lung remains clear.  IMPRESSION: Persistent left lower lobe process likely a combination of infiltrate and effusion.   Electronically Signed   By: Kalman Jewels M.D.   On: 11/15/2014 12:47    Anti-infectives: Anti-infectives    Start     Dose/Rate Route Frequency Ordered Stop   11/16/14 1000  vancomycin (VANCOCIN) IVPB 750 mg/150 ml premix     750 mg 150 mL/hr over 60 Minutes Intravenous Every 12  hours 11/16/14 0957     11/15/14 1700  vancomycin (VANCOCIN) IVPB 750 mg/150 ml premix  Status:  Discontinued     750 mg 150 mL/hr over 60 Minutes Intravenous Every 8 hours 11/15/14 1610 11/16/14 0957   11/12/14 1317  polymyxin B 500,000 Units, bacitracin 50,000 Units in sodium chloride irrigation 0.9 % 500 mL irrigation  Status:  Discontinued       As needed 11/12/14 1319 11/12/14  1417   11/09/14 1400  linezolid (ZYVOX) tablet 600 mg  Status:  Discontinued     600 mg Oral Every 12 hours 11/09/14 1319 11/15/14 1542   11/09/14 0700  vancomycin (VANCOCIN) 500 mg in sodium chloride 0.9 % 100 mL IVPB  Status:  Discontinued     500 mg 100 mL/hr over 60 Minutes Intravenous Every 12 hours 11/08/14 1732 11/08/14 1737   11/08/14 2200  vancomycin (VANCOCIN) IVPB 750 mg/150 ml premix  Status:  Discontinued     750 mg 150 mL/hr over 60 Minutes Intravenous Every 24 hours 11/08/14 1737 11/09/14 1319   11/08/14 1800  meropenem (MERREM) 1 g in sodium chloride 0.9 % 100 mL IVPB     1 g 200 mL/hr over 30 Minutes Intravenous Every 8 hours 11/08/14 1732     11/08/14 1745  vancomycin (VANCOCIN) 1,250 mg in sodium chloride 0.9 % 250 mL IVPB  Status:  Discontinued     1,250 mg 166.7 mL/hr over 90 Minutes Intravenous  Once 11/08/14 1732 11/08/14 1734      Assessment/Plan: s/p Procedure(s): IRRIGATION AND DEBRIDEMENT OF WOUNDS WITH BONE BIOPSY AND SURGICAL PREP  (N/A) APPLICATION A CELL AND VAC  (N/A) Plan for OR for VAC change.  LOS: 9 days    [facility] 11/17/2014

## 2014-11-18 NOTE — Progress Notes (Signed)
PATIENT DETAILS Name: Jason [name] Age: 29 y.o. Sex: male Date of Birth: [birth year] Admit Date: 11/08/2014 Admitting Physician Thurnell Lose, MD PCP:FRY,STEPHEN A, MD  Subjective: No major complaints-npo for OR VAC change later today  Assessment/Plan: Active Problems:   Sepsis: secondary to multiple foci of infetion-Klebsiella Bacteremia,Pelvic osteomyelitis with intramuscular abscess, HCAP and UTI.On empiric Abx as outlined below.Sepsis pathophysiology has resolved.    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 It also showed multiple fluid collections within the abductor magnus and abductor brevis muscles likely consistent with intramuscular abscesses. Subsequently seen by plastic surgery, underwent debridement on 11/12/14 with VAC. Also seen by orthopedics (Dr Marlou Sa), underwent hip aspiration on 2/8-neg culture so far. Current recommendations from infectious disease to continue with current antibiotics as outlined below.Plastic plans on VAC change in OR later today.   Klebsiella pneumoniae bacteremia: Blood culture on 11/08/14 positive for Klebsiella pneumoniae. Had a PICC line in place that has  been discontinued. Repeat blood cultures on 2/5 negative so far, blood culture on 2/9 pending-per ID to replace PICC line once cultures from 2/9 neg for 3 days.   Burkholderia UTI: per ID  unusual organism in urine.Suprapubic Catheter has been changed on 2/5, on antibiotics as outlined below    Healthcare associated pneumonia: Antibiotics as above. Cultures as above. Remains afebrile.Repeat CBC in am.   Delirium: Resolved. Secondary to opioids. Avoid IV opioids and any further escalation in current dosing.   Type 1 diabetes: CBGs stable. Continue with Lantus and SSI.    Functional quadriplegia with neurogenic bladder: Bedbound, suprapubic catheter in  place. Secondary to spinal cord infarction.    Severe Protein calorie malnutrition: Continue with supplements   History of gastroparesis: Currently without any vomiting, continue with as needed Reglan.    Chronic pain syndrome: Continue with transdermal fentanyl and when necessary oxycodone. Avoid further escalation and narcotic dosing.    GERD: Continue PPI   Disposition: Remain inpatient-Home with Home health services in 2-3 days  Antibiotics:  See below   Anti-infectives    Start     Dose/Rate Route Frequency Ordered Stop   11/16/14 1000  vancomycin (VANCOCIN) IVPB 750 mg/150 ml premix     750 mg 150 mL/hr over 60 Minutes Intravenous Every 12 hours 11/16/14 0957     11/15/14 1700  vancomycin (VANCOCIN) IVPB 750 mg/150 ml premix  Status:  Discontinued     750 mg 150 mL/hr over 60 Minutes Intravenous Every 8 hours 11/15/14 1610 11/16/14 0957   11/12/14 1317  polymyxin B 500,000 Units, bacitracin 50,000 Units in sodium chloride irrigation 0.9 % 500 mL irrigation  Status:  Discontinued       As needed 11/12/14 1319 11/12/14 1417   11/09/14 1400  linezolid (ZYVOX) tablet 600 mg  Status:  Discontinued     600 mg Oral Every 12 hours 11/09/14 1319 11/15/14 1542   11/09/14 0700  vancomycin (VANCOCIN) 500 mg in sodium chloride 0.9 % 100 mL IVPB  Status:  Discontinued     500 mg 100 mL/hr over 60 Minutes Intravenous Every 12 hours 11/08/14 1732 11/08/14 1737   11/08/14 2200  vancomycin (VANCOCIN) IVPB 750 mg/150 ml premix  Status:  Discontinued     750 mg 150 mL/hr over 60 Minutes Intravenous Every 24 hours 11/08/14 1737 11/09/14 1319   11/08/14 1800  meropenem (MERREM) 1 g in sodium  chloride 0.9 % 100 mL IVPB     1 g 200 mL/hr over 30 Minutes Intravenous Every 8 hours 11/08/14 1732     11/08/14 1745  vancomycin (VANCOCIN) 1,250 mg in sodium chloride 0.9 % 250 mL IVPB  Status:  Discontinued     1,250 mg 166.7 mL/hr over 90 Minutes Intravenous  Once 11/08/14 1732 11/08/14 1734       DVT Prophylaxis: Prophylactic  Heparin  Code Status: Full code   Family Communication Mother at bedside  Procedures:  2/5 Debridement of decubitus  2/8 Hip aspirate  CONSULTS:  ID and orthopedic surgery  Plastics  MEDICATIONS: Scheduled Meds: . sodium chloride   Intravenous Once  . atorvastatin  20 mg Oral q1800  . baclofen  10 mg Oral BID WC  . baclofen  20 mg Oral QHS  . feeding supplement (GLUCERNA SHAKE)  237 mL Oral TID BM  . feeding supplement (PRO-STAT SUGAR FREE 64)  30 mL Oral Daily  . fentaNYL  25 mcg Transdermal Q72H  . heparin  5,000 Units Subcutaneous 3 times per day  . insulin aspart  0-5 Units Subcutaneous QHS  . insulin aspart  0-9 Units Subcutaneous TID WC  . insulin aspart  4 Units Subcutaneous TID AC  . insulin glargine  25 Units Subcutaneous QHS  . loratadine  10 mg Oral Daily  . meropenem (MERREM) IV  1 g Intravenous Q8H  . pantoprazole  40 mg Oral Daily  . pregabalin  100 mg Oral TID AC  . pregabalin  200 mg Oral QHS  . vancomycin  750 mg Intravenous Q12H   Continuous Infusions: . sodium chloride 50 mL/hr at 11/18/14 0854   PRN Meds:.bisacodyl, gi cocktail, guaiFENesin-dextromethorphan, metoCLOPramide, ondansetron **OR** ondansetron (ZOFRAN) IV, oxyCODONE-acetaminophen **AND** oxyCODONE, polyethylene glycol, sodium chloride, sodium chloride    PHYSICAL EXAM: Vital signs in last 24 hours: Filed Vitals:   11/17/14 0825 11/17/14 2140 11/18/14 0723 11/18/14 0729  BP: 106/64 96/63 122/76   Pulse: 85 117 91   Temp: 98.2 F (36.8 C) 97.8 F (36.6 C) 99.3 F (37.4 C)   TempSrc: Oral Oral Oral   Resp: 17 18 17    Height:      Weight:    69.264 kg (152 lb 11.2 oz)  SpO2: 100% 100% 100%     Weight change:  Filed Weights   11/16/14 0500 11/17/14 0500 11/18/14 0729  Weight: 68.04 kg (150 lb) 68.221 kg (150 lb 6.4 oz) 69.264 kg (152 lb 11.2 oz)   Body mass index is 23.22 kg/(m^2).   Gen Exam: Awake and alert with clear speech.    Neck: Supple, No JVD.   Chest: B/L Clear.   CVS: S1 S2 Regular, no murmurs.  Abdomen: soft, BS +, non tender, non distended.   Intake/Output from previous day:  Intake/Output Summary (Last 24 hours) at 11/18/14 1151 Last data filed at 11/18/14 0730  Gross per 24 hour  Intake 1450.83 ml  Output   4975 ml  Net -3524.17 ml     LAB RESULTS: CBC  Recent Labs Lab 11/13/14 1930 11/14/14 0500 11/15/14 0225 11/16/14 0500 11/18/14 0748  WBC 22.4* 23.1* 28.5* 23.1* 20.0*  HGB 8.1* 8.1* 8.5* 8.2* 8.0*  HCT 25.2* 24.8* 26.3* 25.4* 25.0*  PLT 582* 584* 582* 520* 572*  MCV 82.1 82.1 82.4 82.7 83.3  MCH 26.4 26.8 26.6 26.7 26.7  MCHC 32.1 32.7 32.3 32.3 32.0  RDW 15.9* 15.9* 16.1* 16.5* 16.8*  LYMPHSABS  --   --   --   --  4.1*  MONOABS  --   --   --   --  1.7*  EOSABS  --   --   --   --  0.6  BASOSABS  --   --   --   --  0.1    Chemistries   Recent Labs Lab 11/12/14 0455 11/12/14 1408 11/13/14 0507 11/14/14 0500 11/15/14 0225 11/15/14 1917  NA 138 141 135 135 131* 130*  K 4.6 4.8 4.9 4.9 5.5* 5.3*  CL 111  --  109 109 104 104  CO2 23  --  22 24 23 24   GLUCOSE 160* 56* 209* 145* 174* 153*  BUN 15  --  12 11 11 14   CREATININE 1.15  --  0.81 0.74 0.75 0.83  CALCIUM 10.3  --  10.0 9.6 9.9 10.0    CBG:  Recent Labs Lab 11/17/14 0822 11/17/14 1201 11/17/14 1654 11/17/14 2228 11/18/14 0800  GLUCAP 145* 145* 204* 253* 278*    GFR Estimated Creatinine Clearance: 125.9 mL/min (by C-G formula based on Cr of 0.83).  Coagulation profile No results for input(s): INR, PROTIME in the last 168 hours.  Cardiac Enzymes  Recent Labs Lab 11/14/14 1351 11/14/14 1958 11/15/14 0223  TROPONINI <0.03 0.04* 0.05*    Invalid input(s): POCBNP No results for input(s): DDIMER in the last 72 hours.  Recent Labs  11/17/14 0815  HGBA1C 7.9*   No results for input(s): CHOL, HDL, LDLCALC, TRIG, CHOLHDL, LDLDIRECT in the last 72 hours. No results for input(s): TSH,  T4TOTAL, T3FREE, THYROIDAB in the last 72 hours.  Invalid input(s): FREET3 No results for input(s): VITAMINB12, FOLATE, FERRITIN, TIBC, IRON, RETICCTPCT in the last 72 hours. No results for input(s): LIPASE, AMYLASE in the last 72 hours.  Urine Studies No results for input(s): UHGB, CRYS in the last 72 hours.  Invalid input(s): UACOL, UAPR, USPG, UPH, UTP, UGL, UKET, UBIL, UNIT, UROB, ULEU, UEPI, UWBC, URBC, UBAC, CAST, UCOM, BILUA  MICROBIOLOGY: Recent Results (from the past 240 hour(s))  Urine culture     Status: None   Collection Time: 11/08/14  3:13 PM  Result Value Ref Range Status   Specimen Description URINE, CATHETERIZED  Final   Special Requests NONE  Final   Colony Count   Final    >=100,000 COLONIES/ML Performed at [place] Performed at [facility]    Report Status 11/11/2014 FINAL  Final   Organism ID, Bacteria BURKHOLDERIA CEPACIA  Final      Susceptibility   Burkholderia cepacia - MIC*    LEVOFLOXACIN 4 INTERMEDIATE Intermediate     TRIMETH/SULFA 160 RESISTANT Resistant     CEFTAZIDIME 8 SENSITIVE Sensitive     * BURKHOLDERIA CEPACIA  Culture, blood (routine x 2)     Status: None   Collection Time: 11/08/14  5:25 PM  Result Value Ref Range Status   Specimen Description BLOOD ARM LEFT  Final   Special Requests BOTTLES DRAWN AEROBIC AND ANAEROBIC 10CC  Final   Culture   Final    KLEBSIELLA PNEUMONIAE Note: Culture results may be compromised due to an excessive volume of blood received in culture bottles. Gram Stain Report Called to,Read Back By and Verified With: Juanetta Beets 11/12/14 0144A [place] Performed at [facility]    Report Status 11/14/2014 FINAL  Final   Organism ID, Bacteria KLEBSIELLA PNEUMONIAE  Final      Susceptibility   Klebsiella pneumoniae -  MIC*    AMPICILLIN RESISTANT      AMPICILLIN/SULBACTAM 4 SENSITIVE Sensitive     CEFAZOLIN <=4 SENSITIVE Sensitive      CEFEPIME <=1 SENSITIVE Sensitive     CEFTAZIDIME <=1 SENSITIVE Sensitive     CEFTRIAXONE <=1 SENSITIVE Sensitive     CIPROFLOXACIN <=0.25 SENSITIVE Sensitive     GENTAMICIN <=1 SENSITIVE Sensitive     IMIPENEM <=0.25 SENSITIVE Sensitive     PIP/TAZO <=4 SENSITIVE Sensitive     TOBRAMYCIN <=1 SENSITIVE Sensitive     TRIMETH/SULFA <=20 SENSITIVE Sensitive     * KLEBSIELLA PNEUMONIAE  Culture, blood (routine x 2)     Status: None   Collection Time: 11/08/14  5:35 PM  Result Value Ref Range Status   Specimen Description BLOOD ARM LEFT  Final   Special Requests BOTTLES DRAWN AEROBIC AND ANAEROBIC 10CC  Final   Culture   Final    KLEBSIELLA PNEUMONIAE Note: SUSCEPTIBILITIES PERFORMED ON PREVIOUS CULTURE WITHIN THE LAST 5 DAYS. Gram Stain Report Called to,Read Back By and Verified With: Iran Sizer RN @ [address] [facility] 11/12/14 Performed at [facility]    Report Status 11/14/2014 FINAL  Final  Clostridium Difficile by PCR     Status: None   Collection Time: 11/12/14  8:41 AM  Result Value Ref Range Status   C difficile by pcr NEGATIVE NEGATIVE Final  Culture, blood (routine x 2)     Status: None (Preliminary result)   Collection Time: 11/12/14  8:04 PM  Result Value Ref Range Status   Specimen Description BLOOD LEFT ARM  Final   Special Requests BOTTLES DRAWN AEROBIC AND ANAEROBIC 10CC  Final   Culture   Final           BLOOD CULTURE RECEIVED NO GROWTH TO DATE CULTURE WILL BE HELD FOR 5 DAYS BEFORE ISSUING A FINAL NEGATIVE REPORT Note: Culture results may be compromised due to an excessive volume of blood received in culture bottles. Performed at [facility]    Report Status PENDING  Incomplete  Culture, blood (routine x 2)     Status: None (Preliminary result)   Collection Time: 11/12/14  8:19 PM  Result Value Ref Range Status   Specimen Description BLOOD LEFT ARM  Final   Special Requests   Final    BOTTLES DRAWN AEROBIC AND ANAEROBIC BLUE 10CC RED 5CC   Culture   Final            BLOOD CULTURE RECEIVED NO GROWTH TO DATE CULTURE WILL BE HELD FOR 5 DAYS BEFORE ISSUING A FINAL NEGATIVE REPORT Performed at [facility]    Report Status PENDING  Incomplete  Body fluid culture     Status: None (Preliminary result)   Collection Time: 11/15/14 11:03 AM  Result Value Ref Range Status   Specimen Description FLUID HIP RIGHT SYNOVIAL  Final   Special Requests NONE  Final   Gram Stain   Final    RARE WBC PRESENT, PREDOMINANTLY MONONUCLEAR NO ORGANISMS SEEN Performed at [facility]    Culture   Final    NO GROWTH 1 DAY Performed at [facility]    Report Status PENDING  Incomplete  Blood Cultures x 2 sites     Status: None (Preliminary result)   Collection Time: 11/16/14  7:00 PM  Result Value Ref Range Status   Specimen Description BLOOD RIGHT ARM  Final   Special Requests BOTTLES DRAWN AEROBIC ONLY 10CC  Final  Culture   Final           BLOOD CULTURE RECEIVED NO GROWTH TO DATE CULTURE WILL BE HELD FOR 5 DAYS BEFORE ISSUING A FINAL NEGATIVE REPORT Performed at [facility]    Report Status PENDING  Incomplete  Blood Cultures x 2 sites     Status: None (Preliminary result)   Collection Time: 11/16/14  7:12 PM  Result Value Ref Range Status   Specimen Description BLOOD LEFT HAND  Final   Special Requests BOTTLES DRAWN AEROBIC ONLY 1CC  Final   Culture   Final           BLOOD CULTURE RECEIVED NO GROWTH TO DATE CULTURE WILL BE HELD FOR 5 DAYS BEFORE ISSUING A FINAL NEGATIVE REPORT Performed at [facility]    Report Status PENDING  Incomplete    RADIOLOGY STUDIES/RESULTS: Ct Chest W Contrast  11/15/2014   CLINICAL DATA:  Pleural effusion. Left lower lobe opacity on chest radiograph. Increasing white count on appropriate antibiotics.  EXAM: CT CHEST WITH CONTRAST  TECHNIQUE: Multidetector CT imaging of the chest was performed during intravenous contrast administration.  CONTRAST:  69mL OMNIPAQUE IOHEXOL 300 MG/ML   SOLN  COMPARISON:  Chest 11/15/2014.  FINDINGS: Focal areas of consolidation in both lung bases, greater on the left, with small left pleural effusion. No evidence of loculated fluid to suggest empyema. Air bronchograms are present with suggestion of mild bronchiectasis. Findings likely represent multifocal pneumonia. No pneumothorax.  Normal heart size. Normal caliber thoracic aorta. Great vessel origins are patent. No significant lymphadenopathy in the chest. Esophagus is mildly dilated with fluid and debris demonstrated throughout the esophagus. This may be due to reflux or dysmotility.  Included portions of the upper abdominal organs are grossly unremarkable. No destructive bone lesions.  IMPRESSION: Focal areas of consolidation in both lung bases, greater on the left. Small left pleural effusion. Findings likely to represent pneumonia. No evidence of empyema. Mildly dilated esophagus filled with fluid and debris. This could be due to reflux or dysmotility.   Electronically Signed   By: Lucienne Capers M.D.   On: 11/15/2014 22:08   Mr Pelvis W Wo Contrast  11/10/2014   CLINICAL DATA:  Quadriplegia. Sacral decubitus ulcer. Sepsis. Foul-smelling discharge from the decubitus ulcer.  EXAM: MRI PELVIS WITHOUT AND WITH CONTRAST  TECHNIQUE: Multiplanar multisequence MR imaging of the pelvis was performed both before and after administration of intravenous contrast.  CONTRAST:  73mL MULTIHANCE GADOBENATE DIMEGLUMINE 529 MG/ML IV SOLN  COMPARISON:  10/02/2014  FINDINGS: Again noted are decubitus ulcers over the ischial tuberosities, right larger than left. The right decubitus ulcer extends down to the ischial tuberosity with underlying marrow edema and cortical irregularity consistent with osteomyelitis. There is mild diffuse edema of the pelvic musculature with mild enhancement. There are interval development of multiple small nonenhancing fluid collections within the right adductor magnus and adductor brevis muscles  extending from the right ischial tuberosity most consistent with intramuscular abscesses. The conglomeration of the area measures approximately 4.8 x 2 cm, but each individual collection measures significantly smaller than this. There is a rind of nonenhancement around the wound consistent with necrosis.  There is no marrow signal abnormality involving the left ischial tuberosity.  Bilateral hips demonstrate no fracture, dislocation or avascular necrosis. There is a small right hip joint effusion with mild synovial enhancement. There is no bursa formation. There is generalized edema within the subcutaneous fat throughout the pelvis.  IMPRESSION: 1. Again noted is  a decubitus ulcer over the ischial tuberosities bilaterally, right larger than left. The right decubitus ulcer extends down to the ischial tuberosity with underlying marrow edema, cortical irregularity and enhancement consistent with osteomyelitis similar in appearance to the prior exam. There is mild diffuse edema throughout the pelvic musculature with mild enhancement with interval development of small nonenhancing fluid collections within the right adductor magnus and adductor brevis muscles extending from the right ischial tuberosity most consistent with intramuscular abscesses. 2. Small right joint effusion with mild synovial enhancement. Septic arthritis cannot be excluded. If there is clinical concern recommend arthrocentesis. 3. Decubitus ulcer overlying the left ischial tuberosity without underlying osseous abnormality. 4. There is diffuse mild edema of the pelvic musculature which is likely neurogenic. There is a concern of an element of infectious myositis especially in the right adductor and gluteal compartment.   Electronically Signed   By: Kathreen Devoid   On: 11/10/2014 17:12   Dg Chest Port 1 View  11/15/2014   CLINICAL DATA:  Subsequent encounter for pneumonia.  EXAM: PORTABLE CHEST - 1 VIEW  COMPARISON:  11/08/2014  FINDINGS: The right  PICC line is stable. The tip is near the cavoatrial junction. The cardiac silhouette, mediastinal and hilar contours are within normal limits and stable. Persistent left lower lobe process, likely pneumonia and small parapneumonic effusion. The right lung remains clear.  IMPRESSION: Persistent left lower lobe process likely a combination of infiltrate and effusion.   Electronically Signed   By: Kalman Jewels M.D.   On: 11/15/2014 12:47   Dg Chest Port 1 View  11/08/2014   CLINICAL DATA:  Cough, elevated white count  EXAM: PORTABLE CHEST - 1 VIEW  COMPARISON:  10/10/2014  FINDINGS: Consolidation in the left lower lobe, similar to prior study. Increasing right medial basilar airspace opacity also noted. Findings compatible with pneumonia. No effusions. Right PICC are remains in place, unchanged.  IMPRESSION: Stable left lower lobe pneumonia. Increasing right medial basilar density, also likely pneumonia.   Electronically Signed   By: Rolm Baptise M.D.   On: 11/08/2014 18:48   Dg Hand Complete Left  11/17/2014   CLINICAL DATA:  Nonhealing ulcer in left middle finger.  EXAM: LEFT HAND - COMPLETE 3+ VIEW  COMPARISON:  None.  FINDINGS: There is no evidence of fracture or dislocation. No lytic destruction is seen to suggest osteomyelitis. There is no evidence of arthropathy or other focal bone abnormality. Vascular calcifications are noted.  IMPRESSION: No significant abnormality seen in the left hand.   Electronically Signed   By: Marijo Conception, M.D.   On: 11/17/2014 11:49   Dg Foot Complete Left  11/17/2014   CLINICAL DATA:  Nonhealing diabetic ulcers.  EXAM: LEFT FOOT - COMPLETE 3+ VIEW  COMPARISON:  None.  FINDINGS: There is no evidence of fracture or dislocation. There is no evidence of arthropathy or other focal bone abnormality. Vascular calcifications are noted. No lytic destruction is seen to suggest osteomyelitis.  IMPRESSION: No evidence of osteomyelitis.   Electronically Signed   By: Marijo Conception,  M.D.   On: 11/17/2014 11:58   Dg Foot Complete Right  11/17/2014   CLINICAL DATA:  Diabetic ulcers.  EXAM: RIGHT FOOT COMPLETE - 3+ VIEW  COMPARISON:  None.  FINDINGS: There is no evidence of fracture or dislocation. There is no evidence of arthropathy or other focal bone abnormality. Vascular calcifications are noted. No radiopaque foreign body is noted. No lytic destruction is seen to suggest osteomyelitis.  IMPRESSION: No acute abnormality seen in the right foot.   Electronically Signed   By: Marijo Conception, M.D.   On: 11/17/2014 11:55   Dg Fluoro Guide Ndl Plc/bx  11/15/2014   CLINICAL DATA:  RIGHT HIP ASPIRATION INFECTION. PATIENT HAS DECUBITUS ULCERS.  EXAM: RIGHT HIP ASPIRATION UNDER FLUOROSCOPY  FLUOROSCOPY TIME:  If the device does not provide the exposure index:  Fluoroscopy Time (in minutes and seconds):  7 SECONDS  Number of Acquired Images:  0  PROCEDURE: Overlying skin prepped with Betadine, draped in the usual sterile fashion, and infiltrated locally with buffered Lidocaine. Curved 22 gauge spinal needle advanced to the superolateral margin of the RIGHT femoral head. Diagnostic injection of iodinated contrast demonstrates intra-articular spread without intravascular component.  Aspiration was attempted with no return of fluid. Lavage of the right hip joint was performed with 5 mL of sterile saline with subsequent aspiration of 1 mL of saline from the right hip joint. The spinal needle was removed.  The patient tolerated the procedure well. There were no immediate complications. Hemostasis was achieved.  IMPRESSION: Technically successful right hip aspiration under fluoroscopy. 1 mL of clear right hip joint aspirate was sent for laboratory analysis.   Electronically Signed   By: Kathreen Devoid   On: 11/15/2014 11:34    Oren Binet, MD  [facility] Pager:[phone]  If 7PM-7AM, please contact night-coverage [email or web address] Password [email or web address] 11/18/2014, 11:51 AM   LOS: 10 days

## 2014-11-18 NOTE — Op Note (Signed)
Operative Note   DATE OF OPERATION: 11/18/2014  LOCATION: [name]  SURGICAL DIVISION: Plastic Surgery  PREOPERATIVE DIAGNOSES:  Sacral / ischial ulcer x 2 (5 x 5 x 1.5 cm sacral and 4 x 6 x 3 cm ischial)  POSTOPERATIVE DIAGNOSES:  same  PROCEDURE:  Preparation of sacral ischial ulcers with placement of Acell (1 gm and sheets 7 x 10 cm) and VAC. Debridement of skin and soft tissue.  SURGEON: Theodoro Kos, DO  ANESTHESIA:  General.   COMPLICATIONS: None.   INDICATIONS FOR PROCEDURE:  The patient, Jason [name] is a 29 y.o. male born on 01-Aug-1986, is here for treatment of sacral ischial ulcers. MRN: [id number]  CONSENT:  Informed consent was obtained directly from the patient. Risks, benefits and alternatives were fully discussed. Specific risks including but not limited to bleeding, infection, hematoma, seroma, scarring, pain, infection, contracture, asymmetry, wound healing problems, and need for further surgery were all discussed. The patient did have an ample opportunity to have questions answered to satisfaction.   DESCRIPTION OF PROCEDURE:  The patient was taken to the operating room. The patient's operative site was prepped and draped in a sterile fashion. A time out was performed and all information was confirmed to be correct.  General  anesthesia was administered.  The #10 blade and scissors were used to debride the area of the sacral and ischial ulcers (5 x 5 x 1.5 cm sacral and 4 x 6 x 3 cm ischial).  The area was irrigated with antibiotic solution.  The bovie was used for hemostasis.  The Acell powder 1 gm and sheet 7 x 10 cm were applied and secured with 5-0 Vicryl.  An adaptic and VAC were placed and there was an excellent seal.  The patient tolerated the procedure well.  There were no complications. The patient was allowed to wake from anesthesia, extubated and taken to the recovery room in satisfactory condition.

## 2014-11-18 NOTE — Transfer of Care (Signed)
Immediate Anesthesia Transfer of Care Note  Patient: Jason [name]  Procedure(s) Performed: Procedure(s): IRRIGATION AND DEBRIDEMENT OF SACRAL ULCER ONLY WITH PLACEMENT OF A CELL AND VAC/ DRESSING CHANGE TO UPPER BACK AREA. (N/A)  Patient Location: PACU  Anesthesia Type:General  Level of Consciousness: awake, alert , oriented and patient cooperative  Airway & Oxygen Therapy: Patient Spontanous Breathing and Patient connected to nasal cannula oxygen  Post-op Assessment: Report given to RN and Post -op Vital signs reviewed and stable  Post vital signs: Reviewed and stable  Last Vitals:  Filed Vitals:   11/18/14 0723  BP: 122/76  Pulse: 91  Temp: 37.4 C  Resp: 17    Complications: No apparent anesthesia complications

## 2014-11-18 NOTE — Brief Op Note (Signed)
11/08/2014 - 11/18/2014  4:25 PM  PATIENT:  Jason [name]  29 y.o. male  PRE-OPERATIVE DIAGNOSIS:  sacral ulcer  POST-OPERATIVE DIAGNOSIS:  sacral ulcer  PROCEDURE:  Procedure(s): IRRIGATION AND DEBRIDEMENT OF SACRAL ULCER ONLY WITH PLACEMENT OF A CELL AND VAC/ DRESSING CHANGE TO UPPER BACK AREA. (N/A)  SURGEON:  Surgeon(s) and Role:    * Claire Sanger, DO - Primary  PHYSICIAN ASSISTANT: Shawn Rayburn, PA  ASSISTANTS: none   ANESTHESIA:   general  EBL:  Total I/O In: -  Out: 600 [Urine:350; Stool:250]  BLOOD ADMINISTERED:none  DRAINS: none   LOCAL MEDICATIONS USED:  NONE  SPECIMEN:  No Specimen  DISPOSITION OF SPECIMEN:  N/A  COUNTS:  YES  TOURNIQUET:  * No tourniquets in log *  DICTATION: .Dragon Dictation  PLAN OF CARE: Admit to inpatient   PATIENT DISPOSITION:  PACU - hemodynamically stable.   Delay start of Pharmacological VTE agent (>24hrs) due to surgical blood loss or risk of bleeding: no

## 2014-11-18 NOTE — Interval H&P Note (Signed)
History and Physical Interval Note:  11/18/2014 7:13 AM  [name]  has presented today for surgery, with the diagnosis of ulcers  The various methods of treatment have been discussed with the patient and family. After consideration of risks, benefits and other options for treatment, the patient has consented to  Procedure(s): IRRIGATION AND DEBRIDEMENT OF SACRAL ULCER ONLY WITH PLACEMENT OF A CELL AND VAC (N/A) as a surgical intervention .  The patient's history has been reviewed, patient examined, no change in status, stable for surgery.  I have reviewed the patient's chart and labs.  Questions were answered to the patient's satisfaction.     SANGER,Jason [name]

## 2014-11-19 ENCOUNTER — Other Ambulatory Visit: Payer: Self-pay | Admitting: Plastic Surgery

## 2014-11-19 ENCOUNTER — Encounter (HOSPITAL_COMMUNITY): Payer: Self-pay | Admitting: Plastic Surgery

## 2014-11-19 ENCOUNTER — Inpatient Hospital Stay (HOSPITAL_COMMUNITY): Payer: Medicaid Other

## 2014-11-19 DIAGNOSIS — J189 Pneumonia, unspecified organism: Secondary | ICD-10-CM

## 2014-11-19 DIAGNOSIS — E10621 Type 1 diabetes mellitus with foot ulcer: Secondary | ICD-10-CM

## 2014-11-19 DIAGNOSIS — L89150 Pressure ulcer of sacral region, unstageable: Secondary | ICD-10-CM

## 2014-11-19 DIAGNOSIS — L97529 Non-pressure chronic ulcer of other part of left foot with unspecified severity: Secondary | ICD-10-CM

## 2014-11-19 DIAGNOSIS — L98499 Non-pressure chronic ulcer of skin of other sites with unspecified severity: Secondary | ICD-10-CM

## 2014-11-19 DIAGNOSIS — L89159 Pressure ulcer of sacral region, unspecified stage: Secondary | ICD-10-CM

## 2014-11-19 DIAGNOSIS — L89154 Pressure ulcer of sacral region, stage 4: Secondary | ICD-10-CM

## 2014-11-19 LAB — CULTURE, BLOOD (ROUTINE X 2)
Culture: NO GROWTH
Culture: NO GROWTH

## 2014-11-19 LAB — BODY FLUID CULTURE: CULTURE: NO GROWTH

## 2014-11-19 LAB — BASIC METABOLIC PANEL
Anion gap: 4 — ABNORMAL LOW (ref 5–15)
BUN: 11 mg/dL (ref 6–23)
CO2: 19 mmol/L (ref 19–32)
CREATININE: 0.66 mg/dL (ref 0.50–1.35)
Calcium: 9.6 mg/dL (ref 8.4–10.5)
Chloride: 107 mmol/L (ref 96–112)
GFR calc Af Amer: >90 mL/min (ref >90)
GFR calc non Af Amer: >90 mL/min (ref >90)
Glucose, Bld: 211 mg/dL — ABNORMAL HIGH (ref 70–99)
Potassium: 5 mmol/L (ref 3.5–5.1)
Sodium: 130 mmol/L — ABNORMAL LOW (ref 135–145)

## 2014-11-19 LAB — GLUCOSE, CAPILLARY
GLUCOSE-CAPILLARY: 206 mg/dL — AB (ref 70–99)
Glucose-Capillary: 222 mg/dL — ABNORMAL HIGH (ref 70–99)
Glucose-Capillary: 174 mg/dL — ABNORMAL HIGH (ref 70–99)

## 2014-11-19 MED ORDER — VANCOMYCIN HCL IN DEXTROSE 1-5 GM/200ML-% IV SOLN: 1000.0000 mg | INTRAVENOUS | Status: DC

## 2014-11-19 MED ORDER — DEXLANSOPRAZOLE 60 MG PO CPDR: 60.0000 mg | DELAYED_RELEASE_CAPSULE | Freq: Every day | ORAL | Status: DC

## 2014-11-19 MED ORDER — SODIUM CHLORIDE 0.9 % IJ SOLN: 10.0000 mL | Freq: Two times a day (BID) | INTRAMUSCULAR | Status: DC

## 2014-11-19 MED ORDER — INSULIN ASPART 100 UNIT/ML ~~LOC~~ SOLN: 4.0000 [IU] | Freq: Three times a day (TID) | SUBCUTANEOUS | Status: DC

## 2014-11-19 MED ORDER — GLUCERNA SHAKE PO LIQD: 237.0000 mL | Freq: Three times a day (TID) | ORAL | Status: DC

## 2014-11-19 MED ORDER — SODIUM CHLORIDE 0.9 % IV SOLN: 1.0000 g | INTRAVENOUS | Status: DC

## 2014-11-19 MED ORDER — HEPARIN SOD (PORK) LOCK FLUSH 100 UNIT/ML IV SOLN
250.0000 [IU] | INTRAVENOUS | Status: AC | PRN
  Administered 2014-11-19: 250 [IU]

## 2014-11-19 MED ORDER — SODIUM CHLORIDE 0.9 % IV SOLN
1.0000 g | INTRAVENOUS | Status: DC
  Administered 2014-11-19: 1 g via INTRAVENOUS
  Filled 2014-11-19: qty 1

## 2014-11-19 MED ORDER — SODIUM CHLORIDE 0.9 % IJ SOLN
10.0000 mL | INTRAMUSCULAR | Status: DC | PRN
  Administered 2014-11-19: 10 mL
  Filled 2014-11-19: qty 40

## 2014-11-19 MED ORDER — INSULIN GLARGINE 100 UNIT/ML ~~LOC~~ SOLN: 25.0000 [IU] | Freq: Every day | SUBCUTANEOUS | Status: DC

## 2014-11-19 MED ORDER — DICLOFENAC SODIUM 1 % TD GEL: 2.0000 g | Freq: Every day | TRANSDERMAL | Status: DC | PRN

## 2014-11-19 NOTE — Clinical Social Work Note (Signed)
Clinical Social Worker arranged transport home via [facility] per RN request.  CSW spoke with patient and patient mother at bedside who confirmed address and desire to return home.  Clinical Social Worker will sign off for now as social work intervention is no longer needed. Please consult [name] again if new need arises.  Barbette Or, [place]

## 2014-11-19 NOTE — Progress Notes (Signed)
NURSING PROGRESS NOTE  [facility] [id number] Discharge Data: 11/19/2014 5:41 PM Attending Provider: Jonetta Osgood, MD PCP:FRY,STEPHEN A, MD   Jason [name] to be D/C'd Home per MD order.    All IV's discontinued and monitored for bleeding.  All belongings will be returned to patient for patient to take home including hard copies of all prescriptions.   PICC placed and heparin locked by IV team per MD order prior to discharge.  Patient sent home via EMS by [facility] from [place].  Last Documented Vital Signs:  Blood pressure 142/86, pulse 95, temperature 98.8 F (37.1 C), temperature source Oral, resp. rate 16, height 5\' 8"  (1.727 m), weight 70.7 kg (155 lb 13.8 oz), SpO2 100 %.  Hendricks Limes RN, BS, BSN

## 2014-11-19 NOTE — Progress Notes (Signed)
Physical Therapy Treatment Patient Details Name: Jason [name] MRN: [id number] DOB: 02-06-1986 Today's Date: 11/19/2014    History of Present Illness 29 y.o. male admitted from home to [facility] on 11/08/14 for decubitus ulcers infection management and osteomyelitis.  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 Pt underwent surgical cleaning and debridement of his sacral and ischial wounds on 11/12/14 with palcement of Acell and biopsy of coccx bone.  Wound vac placed as well.     PT Comments    Emphasis today was validating pt's Mom's skills and helping her know what the progression should be for her to help him.  Follow Up Recommendations  No PT follow up     Equipment Recommendations  None recommended by PT;Other (comment) (The w/c update is in the works)    Recommendations for Other Services       Precautions / Restrictions Precautions Precautions: Fall    Mobility  Bed Mobility                  Transfers                    Ambulation/Gait                 Stairs            Wheelchair Mobility    Modified Rankin (Stroke Patients Only)       Balance                                    Cognition                            Exercises      General Comments General comments (skin integrity, edema, etc.): Pt's Mom and therapist discussed a progression of activity to start building pt up.  Pt's Mom described how she did ROM beautifully.  We discussed a progression of working toward EOB and a trunk strengthening/balance progression EOB.  Also discussed activities that she needed to pass off to him and not assisting him so much.ie eating.      Pertinent Vitals/Pain      Home Living                      Prior Function             PT Goals (current goals can now be found in the care plan section) Acute Rehab PT Goals PT Goal Formulation: With patient/family Time For Goal Achievement: 11/23/14 Potential to Achieve Goals: Fair Progress towards PT goals: Progressing toward goals    Frequency  Min 3X/week    PT Plan Current plan remains appropriate    Co-evaluation             End of Session   Activity Tolerance:  (pt too tired and painful to work today.  This session was Mo) Patient left: in bed;with call bell/phone within reach;with family/visitor present     Time: 5643-3295 PT Time Calculation (min) (ACUTE ONLY): 22 min  Charges:  $Self Care/Home Management: 8-22                    G Codes:      Raney Koeppen, [name]  11/19/2014, 5:49 PM  11/19/2014  Donnella Sham, [place] [phone]  (pager)

## 2014-11-19 NOTE — Progress Notes (Signed)
29yo male to change from [place] to [facility] for osteo and sacral ulcer, now s/p I&D w/ wound vanc placed.  Will start Invanz 1g IV Q24H (ok'd by Dr Tommy Medal) for CrCl >100 ml/min and monitor CBC and Cx.  Wynona Neat, PharmD, BCPS 11/19/2014 7:46 AM

## 2014-11-19 NOTE — Progress Notes (Signed)
[place] for Infectious Disease    Subjective: no complaints  Antibiotics:  Anti-infectives    Start     Dose/Rate Route Frequency Ordered Stop   11/19/14 1200  ertapenem (INVANZ) 1 g in sodium chloride 0.9 % 50 mL IVPB  Status:  Discontinued     1 g 100 mL/hr over 30 Minutes Intravenous Every 24 hours 11/19/14 0744 11/19/14 2124   11/19/14 0600  vancomycin (VANCOCIN) IVPB 1000 mg/200 mL premix  Status:  Discontinued     1,000 mg 200 mL/hr over 60 Minutes Intravenous Every 24 hours 11/18/14 1309 11/19/14 2124   11/19/14 0000  ertapenem 1 g in sodium chloride 0.9 % 50 mL     1 g 100 mL/hr over 30 Minutes Intravenous Every 24 hours 11/19/14 1110     11/19/14 0000  vancomycin (VANCOCIN) 1 GM/200ML SOLN  Status:  Discontinued    Comments:  For 6 weeks from 11/12/14   1,000 mg 200 mL/hr over 60 Minutes Intravenous Every 24 hours 11/19/14 1110 11/19/14    11/19/14 0000  vancomycin (VANCOCIN) 1 GM/200ML SOLN    Comments:  For 6 weeks from 11/12/14   1,000 mg 200 mL/hr over 60 Minutes Intravenous Every 24 hours 11/19/14 1358     11/18/14 2345  meropenem (MERREM) 1 g in sodium chloride 0.9 % 100 mL IVPB  Status:  Discontinued     1 g 200 mL/hr over 30 Minutes Intravenous 3 times per day 11/18/14 2330 11/19/14 0743   11/16/14 1000  vancomycin (VANCOCIN) IVPB 750 mg/150 ml premix  Status:  Discontinued     750 mg 150 mL/hr over 60 Minutes Intravenous Every 12 hours 11/16/14 0957 11/18/14 1248   11/15/14 1700  vancomycin (VANCOCIN) IVPB 750 mg/150 ml premix  Status:  Discontinued     750 mg 150 mL/hr over 60 Minutes Intravenous Every 8 hours 11/15/14 1610 11/16/14 0957   11/12/14 1317  polymyxin B 500,000 Units, bacitracin 50,000 Units in sodium chloride irrigation 0.9 % 500 mL irrigation  Status:  Discontinued       As needed 11/12/14 1319 11/12/14 1417   11/09/14 1400  linezolid (ZYVOX) tablet 600 mg  Status:  Discontinued     600 mg Oral Every 12 hours 11/09/14 1319 11/15/14 1542     11/09/14 0700  vancomycin (VANCOCIN) 500 mg in sodium chloride 0.9 % 100 mL IVPB  Status:  Discontinued     500 mg 100 mL/hr over 60 Minutes Intravenous Every 12 hours 11/08/14 1732 11/08/14 1737   11/08/14 2200  vancomycin (VANCOCIN) IVPB 750 mg/150 ml premix  Status:  Discontinued     750 mg 150 mL/hr over 60 Minutes Intravenous Every 24 hours 11/08/14 1737 11/09/14 1319   11/08/14 1800  meropenem (MERREM) 1 g in sodium chloride 0.9 % 100 mL IVPB  Status:  Discontinued     1 g 200 mL/hr over 30 Minutes Intravenous Every 8 hours 11/08/14 1732 11/18/14 2018   11/08/14 1745  vancomycin (VANCOCIN) 1,250 mg in sodium chloride 0.9 % 250 mL IVPB  Status:  Discontinued     1,250 mg 166.7 mL/hr over 90 Minutes Intravenous  Once 11/08/14 1732 11/08/14 1734      Medications: Scheduled Meds:  Continuous Infusions:  PRN Meds:.    Objective: Weight change:   Intake/Output Summary (Last 24 hours) at 11/19/14 2258 Last data filed at 11/19/14 1751  Gross per 24 hour  Intake    770 ml  Output  3125 ml  Net  -2355 ml   Blood pressure 142/86, pulse 95, temperature 98.8 F (37.1 C), temperature source Oral, resp. rate 16, height 5\' 8"  (1.727 m), weight 155 lb 13.8 oz (70.7 kg), SpO2 100 %. Temp:  [97.6 F (36.4 C)-99.4 F (37.4 C)] 98.8 F (37.1 C) (02/12 1553) Pulse Rate:  [85-95] 95 (02/12 1553) Resp:  [16] 16 (02/12 1553) BP: (110-142)/(65-86) 142/86 mmHg (02/12 1553) SpO2:  [100 %] 100 % (02/12 1553) Weight:  [155 lb 13.8 oz (70.7 kg)] 155 lb 13.8 oz (70.7 kg) (02/12 0525)  Physical Exam: General: awake and alert, feeling better  CV: rr Pulm: fairly clear. Skin:   Left foot   09/29/2014:      11/16/14:           Right foot      11/16/14      Left hand where he had blister per Mom in November with exposed tendon:         Neuro: paraplegic   CBC:  CBC Latest Ref Rng 11/18/2014 11/16/2014 11/15/2014  WBC 4.0 - 10.5 K/uL 20.0(H) 23.1(H) 28.5(H)   Hemoglobin 13.0 - 17.0 g/dL 8.0(L) 8.2(L) 8.5(L)  Hematocrit 39.0 - 52.0 % 25.0(L) 25.4(L) 26.3(L)  Platelets 150 - 400 K/uL 572(H) 520(H) 582(H)       BMET  Recent Labs  11/19/14 0622  NA 130*  K 5.0  CL 107  CO2 19  GLUCOSE 211*  BUN 11  CREATININE 0.66  CALCIUM 9.6     Liver Panel  No results for input(s): PROT, ALBUMIN, AST, ALT, ALKPHOS, BILITOT, BILIDIR, IBILI in the last 72 hours.     Sedimentation Rate No results for input(s): ESRSEDRATE in the last 72 hours. C-Reactive Protein No results for input(s): CRP in the last 72 hours.  Micro Results: Recent Results (from the past 720 hour(s))  Urine culture     Status: None   Collection Time: 11/08/14  3:13 PM  Result Value Ref Range Status   Specimen Description URINE, CATHETERIZED  Final   Special Requests NONE  Final   Colony Count   Final    >=100,000 COLONIES/ML Performed at [facility]   Final    BURKHOLDERIA CEPACIA Performed at [facility]    Report Status 11/11/2014 FINAL  Final   Organism ID, Bacteria BURKHOLDERIA CEPACIA  Final      Susceptibility   Burkholderia cepacia - MIC*    LEVOFLOXACIN 4 INTERMEDIATE Intermediate     TRIMETH/SULFA 160 RESISTANT Resistant     CEFTAZIDIME 8 SENSITIVE Sensitive     * BURKHOLDERIA CEPACIA  Culture, blood (routine x 2)     Status: None   Collection Time: 11/08/14  5:25 PM  Result Value Ref Range Status   Specimen Description BLOOD ARM LEFT  Final   Special Requests BOTTLES DRAWN AEROBIC AND ANAEROBIC 10CC  Final   Culture   Final    KLEBSIELLA PNEUMONIAE Note: Culture results may be compromised due to an excessive volume of blood received in culture bottles. Gram Stain Report Called to,Read Back By and Verified With: Juanetta Beets 11/12/14 0144A [place] Performed at [facility]    Report Status 11/14/2014 FINAL  Final   Organism ID, Bacteria KLEBSIELLA PNEUMONIAE  Final      Susceptibility   Klebsiella  pneumoniae - MIC*    AMPICILLIN RESISTANT      AMPICILLIN/SULBACTAM 4 SENSITIVE Sensitive     CEFAZOLIN <=4 SENSITIVE Sensitive  CEFEPIME <=1 SENSITIVE Sensitive     CEFTAZIDIME <=1 SENSITIVE Sensitive     CEFTRIAXONE <=1 SENSITIVE Sensitive     CIPROFLOXACIN <=0.25 SENSITIVE Sensitive     GENTAMICIN <=1 SENSITIVE Sensitive     IMIPENEM <=0.25 SENSITIVE Sensitive     PIP/TAZO <=4 SENSITIVE Sensitive     TOBRAMYCIN <=1 SENSITIVE Sensitive     TRIMETH/SULFA <=20 SENSITIVE Sensitive     * KLEBSIELLA PNEUMONIAE  Culture, blood (routine x 2)     Status: None   Collection Time: 11/08/14  5:35 PM  Result Value Ref Range Status   Specimen Description BLOOD ARM LEFT  Final   Special Requests BOTTLES DRAWN AEROBIC AND ANAEROBIC 10CC  Final   Culture   Final    KLEBSIELLA PNEUMONIAE Note: SUSCEPTIBILITIES PERFORMED ON PREVIOUS CULTURE WITHIN THE LAST 5 DAYS. Gram Stain Report Called to,Read Back By and Verified With: Iran Sizer RN @ 030SP [facility] 11/12/14 Performed at [facility]    Report Status 11/14/2014 FINAL  Final  Clostridium Difficile by PCR     Status: None   Collection Time: 11/12/14  8:41 AM  Result Value Ref Range Status   C difficile by pcr NEGATIVE NEGATIVE Final  Culture, blood (routine x 2)     Status: None   Collection Time: 11/12/14  8:04 PM  Result Value Ref Range Status   Specimen Description BLOOD LEFT ARM  Final   Special Requests BOTTLES DRAWN AEROBIC AND ANAEROBIC 10CC  Final   Culture   Final    NO GROWTH 5 DAYS Note: Culture results may be compromised due to an excessive volume of blood received in culture bottles. Performed at [facility]    Report Status 11/19/2014 FINAL  Final  Culture, blood (routine x 2)     Status: None   Collection Time: 11/12/14  8:19 PM  Result Value Ref Range Status   Specimen Description BLOOD LEFT ARM  Final   Special Requests   Final    BOTTLES DRAWN AEROBIC AND ANAEROBIC BLUE 10CC RED 5CC   Culture   Final     NO GROWTH 5 DAYS Performed at [facility]    Report Status 11/19/2014 FINAL  Final  Body fluid culture     Status: None   Collection Time: 11/15/14 11:03 AM  Result Value Ref Range Status   Specimen Description FLUID HIP RIGHT SYNOVIAL  Final   Special Requests NONE  Final   Gram Stain   Final    RARE WBC PRESENT, PREDOMINANTLY MONONUCLEAR NO ORGANISMS SEEN Performed at [facility]    Culture   Final    NO GROWTH 3 DAYS Performed at [facility]    Report Status 11/19/2014 FINAL  Final  Blood Cultures x 2 sites     Status: None (Preliminary result)   Collection Time: 11/16/14  7:00 PM  Result Value Ref Range Status   Specimen Description BLOOD RIGHT ARM  Final   Special Requests BOTTLES DRAWN AEROBIC ONLY 10CC  Final   Culture   Final           BLOOD CULTURE RECEIVED NO GROWTH TO DATE CULTURE WILL BE HELD FOR 5 DAYS BEFORE ISSUING A FINAL NEGATIVE REPORT Performed at [facility]    Report Status PENDING  Incomplete  Blood Cultures x 2 sites     Status: None (Preliminary result)   Collection Time: 11/16/14  7:12 PM  Result Value Ref Range Status  Specimen Description BLOOD LEFT HAND  Final   Special Requests BOTTLES DRAWN AEROBIC ONLY 1CC  Final   Culture   Final           BLOOD CULTURE RECEIVED NO GROWTH TO DATE CULTURE WILL BE HELD FOR 5 DAYS BEFORE ISSUING A FINAL NEGATIVE REPORT Performed at [facility]    Report Status PENDING  Incomplete    Studies/Results: Dg Chest Port 1 View  11/19/2014   CLINICAL DATA:  PICC line placement.  Pneumonia.  EXAM: PORTABLE CHEST - 1 VIEW  COMPARISON:  11/15/2014  FINDINGS: Right arm PICC line tip overlies the distal SVC.  Decreased airspace disease is seen in the left retrocardiac lung base. There is mild persistent atelectasis or infiltrate seen in the medial right lung base. No evidence of pleural effusion. Heart size is normal.  IMPRESSION: Right arm PICC line tip overlies the distal SVC.   Decreased airspace disease in left retrocardiac lung base. Mild atelectasis or infiltrate persists in medial right lung base.   Electronically Signed   By: Earle Gell M.D.   On: 11/19/2014 15:28      Assessment/Plan:  Active Problems:   Type 1 diabetes, uncontrolled, with neuropathy   GERD   Gastroparesis due to DM   Protein-calorie malnutrition, severe   Spinal cord infarction (history of)   Tetraplegia   Neurogenic bladder   Sepsis   Severe sepsis   Stage IV decubitus ulcer   Osteomyelitis   Osteomyelitis, pelvis   AKI (acute kidney injury)   Decubitus ulcer   Osteomyelitis, pelvic region and thigh   Septic hip   Burkholderia cepacia infection   Urinary tract infectious disease   Suprapubic catheter   Decubitus ulcer of ischial area   Sacral decubitus ulcer   Gram-negative bacteremia   Right hip joint effusion   Klebsiella pneumoniae infection   Finger ulcer   Pleural effusion   Diabetic ulcer of left foot associated with diabetes mellitus due to underlying condition   Aspiration pneumonia    Jason [name] is a 29 y.o. male with Paraplegia, prior MSSA bacteremia, decubitus ulcers, pelvic osteomyelitis sp I and D by Dr Grandville Silos with bone cultures sent in December on vancomycin which grew multiple organisms but no staph or strep. Patient went home on IV vancomycin alone.  In the interim he appeared to have developed a blistering of his skin which occurred while his wound had packing material in place for protracted amount of time due to the fact that he did not have a home health nurse to come visit him due to the winter weather. Since that is yet also had worsening foul-smelling drainage from his midline large decubitus ulcer that appears worse on exam today than when I saw him last. MRI done today shows ischial osteomyelitis on the right that is stable on exam, nonenhancing fluid collections withinthe right adductor magnus and adductor brevis muscles extending fromthe  right ischial tuberosity consistent with intramuscular Abscesses, Small right joint effusion with mild synovial enhancement, ulcer of the left ischial tuberosity without underlying bone abnormalities, edema of the pelvic musculature.    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 Migdalia Dk took patient to the OR  For surgery and obtained deep cultures. Also Greatly appreciate Dr. Marlou Sa from Orthopedics seeing the patient  --followup OPERATIVE CULTURES  --ASPIRATE BY FLUORO OF HIP  Only 4 WBC  --continue  IV vancomycin and fine to change to [facility] 1 GRAM daily  COMPLETE 6 WEEKS OF POSTOPERATIVE ANTIBIOTICS  #2  Klebsiella PNA bacteremia  22 blood cultures:  DC'd PICC peripheral IV  repeat blood cultures taken  OK TO REPLACE PICC  #3 Burkholderia cepacia UTI:   This is a MDR pathogen typically and NOT typically found in urine but rather in the lungs of CF patients, Polly immunocompromised patients. I did find right report of an outbreak associated with lubricating jelly was used to insert catheters.  Suprapubic catheter has been exchanged    #4 Blistering rash; he has other areas and per Mom develops such exfoliating rash when he is sick  #4 Diabetic Feet: Has some ulcerations on left foot with plantar and dorsal aspects  - plain films without osteomyelitis negative    #5  Left hand with exposed tendon: plain films fine  #6 Pneumonia: likely a component of aspiration He is more than adequately covered  We will have him followup with [name] in [facility] in next 4 weeks.       LOS: 11 days   [name] 11/19/2014, 10:58 PM

## 2014-11-19 NOTE — Progress Notes (Signed)
Peripherally Inserted Central Catheter/Midline Placement  The IV Nurse has discussed with the patient and/or persons authorized to consent for the patient, the purpose of this procedure and the potential benefits and risks involved with this procedure.  The benefits include less needle sticks, lab draws from the catheter and patient may be discharged home with the catheter.  Risks include, but not limited to, infection, bleeding, blood clot (thrombus formation), and puncture of an artery; nerve damage and irregular heat beat.  Alternatives to this procedure were also discussed.  PICC/Midline Placement Documentation  PICC / Midline Single Lumen 33/54/56 PICC Right Basilic 38 cm 1 cm (Active)  Indication for Insertion or Continuance of Line Home intravenous therapies (PICC only) 11/19/2014  2:31 PM  Exposed Catheter (cm) 1 cm 11/19/2014  2:31 PM  Line Status Flushed;Saline locked;Blood return noted 11/19/2014  2:31 PM  Dressing Change Due 11/26/14 11/19/2014  2:31 PM       [name] 11/19/2014, 2:33 PM

## 2014-11-19 NOTE — Discharge Summary (Addendum)
PATIENT DETAILS Name: Jason [name] Age: 29 y.o. Sex: male Date of Birth: [birth year] MRN: [id number]. Admitting Physician: Thurnell Lose, MD PCP:FRY,STEPHEN A, MD  Admit Date: 11/08/2014 Discharge date: 11/19/2014  Recommendations for Outpatient Follow-up:  1. 6 weeks of vancomycin and ertapenem from 11/12/14 2. Please check Vanco trough levels, CBC and chemistries every week and fax results to Dr. Tommy Medal in the infectious disease clinic 3. Wound care as instructed by Dr. Migdalia Dk  PRIMARY DISCHARGE DIAGNOSIS:  Active Problems:   Type 1 diabetes, uncontrolled, with neuropathy   GERD   Gastroparesis due to DM   Protein-calorie malnutrition, severe   Spinal cord infarction (history of)   Tetraplegia   Neurogenic bladder   Sepsis   Severe sepsis   Stage IV decubitus ulcer   Osteomyelitis   Osteomyelitis, pelvis   AKI (acute kidney injury)   Decubitus ulcer   Osteomyelitis, pelvic region and thigh   Septic hip   Burkholderia cepacia infection   Urinary tract infectious disease   Suprapubic catheter   Decubitus ulcer of ischial area   Sacral decubitus ulcer   Gram-negative bacteremia   Right hip joint effusion   Klebsiella pneumoniae infection   Finger ulcer   Pleural effusion   Diabetic ulcer of left foot associated with diabetes mellitus due to underlying condition   Aspiration pneumonia      PAST MEDICAL HISTORY: Past Medical History  Diagnosis Date  . GERD (gastroesophageal reflux disease)   . Asthma   . Hx MRSA infection     on face  . Gastroparesis   . Diabetic neuropathy   . Seizures   . Stroke     spinal stroke in 4/15  . Diabetes mellitus     sees Dr. Loanne Drilling   . Family history of anesthesia complication     Pt mother can't have epidural procedures  . Dysrhythmia   . Pneumonia   . Arthritis   . Fibromyalgia     DISCHARGE MEDICATIONS: Current Discharge Medication List    START taking these medications   Details  ertapenem 1 g in sodium  chloride 0.9 % 50 mL Inject 1 g into the vein daily. For 6 weeks from 11/12/14 Qty: 35 vial, Refills: 0    vancomycin (VANCOCIN) 1 GM/200ML SOLN Inject 200 mLs (1,000 mg total) into the vein daily. Qty: 7000 mL, Refills: 0      CONTINUE these medications which have CHANGED   Details  dexlansoprazole (DEXILANT) 60 MG capsule Take 1 capsule (60 mg total) by mouth daily. Qty: 30 capsule, Refills: 0    diclofenac sodium (VOLTAREN) 1 % GEL Apply 2 g topically daily as needed (for pain). Qty: 100 g, Refills: 0    feeding supplement, GLUCERNA SHAKE, (GLUCERNA SHAKE) LIQD Take 237 mLs by mouth 3 (three) times daily between meals. Qty: 237 mL, Refills: 90    insulin aspart (NOVOLOG) 100 UNIT/ML injection Inject 4 Units into the skin 3 (three) times daily before meals. Qty: 10 mL, Refills: 0    insulin glargine (LANTUS) 100 UNIT/ML injection Inject 0.25 mLs (25 Units total) into the skin at bedtime. Qty: 10 mL, Refills: 0      CONTINUE these medications which have NOT CHANGED   Details  albuterol (PROVENTIL) (2.5 MG/3ML) 0.083% nebulizer solution Take 3 mLs (2.5 mg total) by nebulization every 4 (four) hours as needed for wheezing or shortness of breath. Qty: 75 mL, Refills: 3    baclofen (LIORESAL) 10 MG tablet Take  0.5 tablets (5 mg total) by mouth 3 (three) times daily. Takes 10mg  in morning and lunchtime and takes 20mg  at bedtime Qty: 30 each, Refills: 0    collagenase (SANTYL) ointment Apply 1 application topically daily. Qty: 90 g, Refills: 5    fentaNYL (DURAGESIC - DOSED MCG/HR) 25 MCG/HR patch Place 25 mcg onto the skin every 3 (three) days.    loratadine (CLARITIN) 10 MG tablet Take 10 mg by mouth daily.     potassium chloride SA (KLOR-CON M20) 20 MEQ tablet Take 1 tablet (20 mEq total) by mouth daily. Qty: 90 tablet, Refills: 3    pregabalin (LYRICA) 100 MG capsule Take 100-200 mg by mouth 4 (four) times daily. Take 100 mg by mouth in the morning, take 100 mg by mouth in the  afternoon, take 100 mg by mouth in the evening and take 200 mg by mouth at bedtime.    glucagon (GLUCAGON EMERGENCY) 1 MG injection Inject 1 mg into the vein once as needed. Qty: 1 each, Refills: 12      STOP taking these medications     oxyCODONE (ROXICODONE) 15 MG immediate release tablet      vancomycin 500 mg in sodium chloride 0.9 % 100 mL         ALLERGIES:   Allergies  Allergen Reactions  . Cefuroxime Axetil Anaphylaxis  . Morphine And Related Other (See Comments)    Changed mental status, confusion, headache, visual hallucination  . Penicillins Anaphylaxis and Other (See Comments)    ?can take amoxicillin?  . Sulfa Antibiotics Anaphylaxis, Shortness Of Breath and Other (See Comments)  . Tessalon [Benzonatate] Anaphylaxis  . Shellfish Allergy Itching and Other (See Comments)    Took benadryl to alleviate reaction    BRIEF HPI:  See H&P, Labs, Consult and Test reports for all details in brief, patient is 29 year old male with functional quadriplegia, stage IV sacral decubitus ulcer and known osteomyelitis admitted for evaluation of sepsis likely secondary to wound infection.  CONSULTATIONS:   ID and Plastic surgery   Orthopedics  PERTINENT RADIOLOGIC STUDIES: Ct Chest W Contrast  11/15/2014   CLINICAL DATA:  Pleural effusion. Left lower lobe opacity on chest radiograph. Increasing white count on appropriate antibiotics.  EXAM: CT CHEST WITH CONTRAST  TECHNIQUE: Multidetector CT imaging of the chest was performed during intravenous contrast administration.  CONTRAST:  24mL OMNIPAQUE IOHEXOL 300 MG/ML  SOLN  COMPARISON:  Chest 11/15/2014.  FINDINGS: Focal areas of consolidation in both lung bases, greater on the left, with small left pleural effusion. No evidence of loculated fluid to suggest empyema. Air bronchograms are present with suggestion of mild bronchiectasis. Findings likely represent multifocal pneumonia. No pneumothorax.  Normal heart size. Normal caliber  thoracic aorta. Great vessel origins are patent. No significant lymphadenopathy in the chest. Esophagus is mildly dilated with fluid and debris demonstrated throughout the esophagus. This may be due to reflux or dysmotility.  Included portions of the upper abdominal organs are grossly unremarkable. No destructive bone lesions.  IMPRESSION: Focal areas of consolidation in both lung bases, greater on the left. Small left pleural effusion. Findings likely to represent pneumonia. No evidence of empyema. Mildly dilated esophagus filled with fluid and debris. This could be due to reflux or dysmotility.   Electronically Signed   By: Lucienne Capers M.D.   On: 11/15/2014 22:08   Mr Pelvis W Wo Contrast  11/10/2014   CLINICAL DATA:  Quadriplegia. Sacral decubitus ulcer. Sepsis. Foul-smelling discharge from the decubitus ulcer.  EXAM: MRI PELVIS WITHOUT AND WITH CONTRAST  TECHNIQUE: Multiplanar multisequence MR imaging of the pelvis was performed both before and after administration of intravenous contrast.  CONTRAST:  73mL MULTIHANCE GADOBENATE DIMEGLUMINE 529 MG/ML IV SOLN  COMPARISON:  10/02/2014  FINDINGS: Again noted are decubitus ulcers over the ischial tuberosities, right larger than left. The right decubitus ulcer extends down to the ischial tuberosity with underlying marrow edema and cortical irregularity consistent with osteomyelitis. There is mild diffuse edema of the pelvic musculature with mild enhancement. There are interval development of multiple small nonenhancing fluid collections within the right adductor magnus and adductor brevis muscles extending from the right ischial tuberosity most consistent with intramuscular abscesses. The conglomeration of the area measures approximately 4.8 x 2 cm, but each individual collection measures significantly smaller than this. There is a rind of nonenhancement around the wound consistent with necrosis.  There is no marrow signal abnormality involving the left ischial  tuberosity.  Bilateral hips demonstrate no fracture, dislocation or avascular necrosis. There is a small right hip joint effusion with mild synovial enhancement. There is no bursa formation. There is generalized edema within the subcutaneous fat throughout the pelvis.  IMPRESSION: 1. Again noted is a decubitus ulcer over the ischial tuberosities bilaterally, right larger than left. The right decubitus ulcer extends down to the ischial tuberosity with underlying marrow edema, cortical irregularity and enhancement consistent with osteomyelitis similar in appearance to the prior exam. There is mild diffuse edema throughout the pelvic musculature with mild enhancement with interval development of small nonenhancing fluid collections within the right adductor magnus and adductor brevis muscles extending from the right ischial tuberosity most consistent with intramuscular abscesses. 2. Small right joint effusion with mild synovial enhancement. Septic arthritis cannot be excluded. If there is clinical concern recommend arthrocentesis. 3. Decubitus ulcer overlying the left ischial tuberosity without underlying osseous abnormality. 4. There is diffuse mild edema of the pelvic musculature which is likely neurogenic. There is a concern of an element of infectious myositis especially in the right adductor and gluteal compartment.   Electronically Signed   By: Kathreen Devoid   On: 11/10/2014 17:12   Dg Chest Port 1 View  11/15/2014   CLINICAL DATA:  Subsequent encounter for pneumonia.  EXAM: PORTABLE CHEST - 1 VIEW  COMPARISON:  11/08/2014  FINDINGS: The right PICC line is stable. The tip is near the cavoatrial junction. The cardiac silhouette, mediastinal and hilar contours are within normal limits and stable. Persistent left lower lobe process, likely pneumonia and small parapneumonic effusion. The right lung remains clear.  IMPRESSION: Persistent left lower lobe process likely a combination of infiltrate and effusion.    Electronically Signed   By: Kalman Jewels M.D.   On: 11/15/2014 12:47   Dg Chest Port 1 View  11/08/2014   CLINICAL DATA:  Cough, elevated white count  EXAM: PORTABLE CHEST - 1 VIEW  COMPARISON:  10/10/2014  FINDINGS: Consolidation in the left lower lobe, similar to prior study. Increasing right medial basilar airspace opacity also noted. Findings compatible with pneumonia. No effusions. Right PICC are remains in place, unchanged.  IMPRESSION: Stable left lower lobe pneumonia. Increasing right medial basilar density, also likely pneumonia.   Electronically Signed   By: Rolm Baptise M.D.   On: 11/08/2014 18:48   Dg Hand Complete Left  11/17/2014   CLINICAL DATA:  Nonhealing ulcer in left middle finger.  EXAM: LEFT HAND - COMPLETE 3+ VIEW  COMPARISON:  None.  FINDINGS: There is no  evidence of fracture or dislocation. No lytic destruction is seen to suggest osteomyelitis. There is no evidence of arthropathy or other focal bone abnormality. Vascular calcifications are noted.  IMPRESSION: No significant abnormality seen in the left hand.   Electronically Signed   By: Marijo Conception, M.D.   On: 11/17/2014 11:49   Dg Foot Complete Left  11/17/2014   CLINICAL DATA:  Nonhealing diabetic ulcers.  EXAM: LEFT FOOT - COMPLETE 3+ VIEW  COMPARISON:  None.  FINDINGS: There is no evidence of fracture or dislocation. There is no evidence of arthropathy or other focal bone abnormality. Vascular calcifications are noted. No lytic destruction is seen to suggest osteomyelitis.  IMPRESSION: No evidence of osteomyelitis.   Electronically Signed   By: Marijo Conception, M.D.   On: 11/17/2014 11:58   Dg Foot Complete Right  11/17/2014   CLINICAL DATA:  Diabetic ulcers.  EXAM: RIGHT FOOT COMPLETE - 3+ VIEW  COMPARISON:  None.  FINDINGS: There is no evidence of fracture or dislocation. There is no evidence of arthropathy or other focal bone abnormality. Vascular calcifications are noted. No radiopaque foreign body is noted. No lytic  destruction is seen to suggest osteomyelitis.  IMPRESSION: No acute abnormality seen in the right foot.   Electronically Signed   By: Marijo Conception, M.D.   On: 11/17/2014 11:55   Dg Fluoro Guide Ndl Plc/bx  11/15/2014   CLINICAL DATA:  RIGHT HIP ASPIRATION INFECTION. PATIENT HAS DECUBITUS ULCERS.  EXAM: RIGHT HIP ASPIRATION UNDER FLUOROSCOPY  FLUOROSCOPY TIME:  If the device does not provide the exposure index:  Fluoroscopy Time (in minutes and seconds):  7 SECONDS  Number of Acquired Images:  0  PROCEDURE: Overlying skin prepped with Betadine, draped in the usual sterile fashion, and infiltrated locally with buffered Lidocaine. Curved 22 gauge spinal needle advanced to the superolateral margin of the RIGHT femoral head. Diagnostic injection of iodinated contrast demonstrates intra-articular spread without intravascular component.  Aspiration was attempted with no return of fluid. Lavage of the right hip joint was performed with 5 mL of sterile saline with subsequent aspiration of 1 mL of saline from the right hip joint. The spinal needle was removed.  The patient tolerated the procedure well. There were no immediate complications. Hemostasis was achieved.  IMPRESSION: Technically successful right hip aspiration under fluoroscopy. 1 mL of clear right hip joint aspirate was sent for laboratory analysis.   Electronically Signed   By: Kathreen Devoid   On: 11/15/2014 11:34     PERTINENT LAB RESULTS: CBC:  Recent Labs  11/18/14 0748  WBC 20.0*  HGB 8.0*  HCT 25.0*  PLT 572*   CMET CMP     Component Value Date/Time   NA 130* 11/19/2014 0622   K 5.0 11/19/2014 0622   CL 107 11/19/2014 0622   CO2 19 11/19/2014 0622   GLUCOSE 211* 11/19/2014 0622   BUN 11 11/19/2014 0622   CREATININE 0.66 11/19/2014 0622   CALCIUM 9.6 11/19/2014 0622   PROT 6.8 11/08/2014 1534   ALBUMIN 1.9* 11/08/2014 1534   AST 10 11/08/2014 1534   ALT 10 11/08/2014 1534   ALKPHOS 144* 11/08/2014 1534   BILITOT 0.6  11/08/2014 1534   GFRNONAA >90 11/19/2014 0622   GFRAA >90 11/19/2014 0622    GFR Estimated Creatinine Clearance: 130.6 mL/min (by C-G formula based on Cr of 0.66). No results for input(s): LIPASE, AMYLASE in the last 72 hours. No results for input(s): CKTOTAL, CKMB, CKMBINDEX, TROPONINI  in the last 72 hours. Invalid input(s): POCBNP No results for input(s): DDIMER in the last 72 hours.  Recent Labs  11/17/14 0815  HGBA1C 7.9*   No results for input(s): CHOL, HDL, LDLCALC, TRIG, CHOLHDL, LDLDIRECT in the last 72 hours. No results for input(s): TSH, T4TOTAL, T3FREE, THYROIDAB in the last 72 hours.  Invalid input(s): FREET3 No results for input(s): VITAMINB12, FOLATE, FERRITIN, TIBC, IRON, RETICCTPCT in the last 72 hours. Coags: No results for input(s): INR in the last 72 hours.  Invalid input(s): PT Microbiology: Recent Results (from the past 240 hour(s))  Clostridium Difficile by PCR     Status: None   Collection Time: 11/12/14  8:41 AM  Result Value Ref Range Status   C difficile by pcr NEGATIVE NEGATIVE Final  Culture, blood (routine x 2)     Status: None   Collection Time: 11/12/14  8:04 PM  Result Value Ref Range Status   Specimen Description BLOOD LEFT ARM  Final   Special Requests BOTTLES DRAWN AEROBIC AND ANAEROBIC 10CC  Final   Culture   Final    NO GROWTH 5 DAYS Note: Culture results may be compromised due to an excessive volume of blood received in culture bottles. Performed at [facility]    Report Status 11/19/2014 FINAL  Final  Culture, blood (routine x 2)     Status: None   Collection Time: 11/12/14  8:19 PM  Result Value Ref Range Status   Specimen Description BLOOD LEFT ARM  Final   Special Requests   Final    BOTTLES DRAWN AEROBIC AND ANAEROBIC BLUE 10CC RED 5CC   Culture   Final    NO GROWTH 5 DAYS Performed at [facility]    Report Status 11/19/2014 FINAL  Final  Body fluid culture     Status: None (Preliminary result)    Collection Time: 11/15/14 11:03 AM  Result Value Ref Range Status   Specimen Description FLUID HIP RIGHT SYNOVIAL  Final   Special Requests NONE  Final   Gram Stain   Final    RARE WBC PRESENT, PREDOMINANTLY MONONUCLEAR NO ORGANISMS SEEN Performed at [facility]    Culture   Final    NO GROWTH 2 DAYS Performed at [facility]    Report Status PENDING  Incomplete  Blood Cultures x 2 sites     Status: None (Preliminary result)   Collection Time: 11/16/14  7:00 PM  Result Value Ref Range Status   Specimen Description BLOOD RIGHT ARM  Final   Special Requests BOTTLES DRAWN AEROBIC ONLY 10CC  Final   Culture   Final           BLOOD CULTURE RECEIVED NO GROWTH TO DATE CULTURE WILL BE HELD FOR 5 DAYS BEFORE ISSUING A FINAL NEGATIVE REPORT Performed at [facility]    Report Status PENDING  Incomplete  Blood Cultures x 2 sites     Status: None (Preliminary result)   Collection Time: 11/16/14  7:12 PM  Result Value Ref Range Status   Specimen Description BLOOD LEFT HAND  Final   Special Requests BOTTLES DRAWN AEROBIC ONLY 1CC  Final   Culture   Final           BLOOD CULTURE RECEIVED NO GROWTH TO DATE CULTURE WILL BE HELD FOR 5 DAYS BEFORE ISSUING A FINAL NEGATIVE REPORT Performed at [facility]    Report Status PENDING  Incomplete     BRIEF HOSPITAL COURSE:  Sepsis: secondary to multiple foci of infetion-Klebsiella Bacteremia,Pelvic osteomyelitis with intramuscular abscess, HCAP and UTI.sepsis pathophysiology has resolved, on empiric antibiotics including vancomycin and meropenem during this hospital stay, this M.D. spoke with infectious disease-Dr. Lucianne Lei dam-on 2/11, recommendations are for vancomycin and ertapenem for 6 weeks.   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 It also showed multiple fluid collections within the  abductor magnus and abductor brevis muscles likely consistent with intramuscular abscesses. Subsequently seen by plastic surgery, underwent debridement on 11/12/14 and on 11/18/14 with VAC placement. Also seen by orthopedics (Dr Marlou Sa), underwent hip aspiration on 2/8-neg culture so far. Current recommendations from infectious disease   are for vancomycin and ertapenem for 6 weeks from date of last debridement-11/12/14. Patient will need weekly chemistries, CBC and Vanco trough levels faxed to Dr. Lucianne Lei dam at the infectious disease clinic.  Klebsiella pneumoniae bacteremia: Blood culture on 11/08/14 positive for Klebsiella pneumoniae. Had a PICC line in place that has been discontinued. Repeat blood cultures on 2/5 and 2/9 negative so far, will place PICC line on 2/12 and then discharged on the above noted antibiotics.  Burkholderia UTI: per ID unusual organism in urine.Suprapubic Catheter has been changed on 2/5, on antibiotics as outlined above.  Healthcare associated pneumonia: Antibiotics as above. Cultures as above. Remains afebrile.  Delirium: Resolved. Secondary to IV opioids. Vision was placed back on usual home dosing of chronic opioids, delirium completely resolved following that.  Type 1 diabetes: CBGs stable. Continue with Lantus and SSI.   Functional quadriplegia with neurogenic bladder: Bedbound, suprapubic catheter in place. Secondary to spinal cord infarction.   Severe Protein calorie malnutrition: Continue with supplements  History of gastroparesis: Currently without any vomiting   Chronic pain syndrome: Continue with transdermal fentanyl and when necessary oxycodone. Avoid further escalation and narcotic dosing.   GERD: Continue PPI   TODAY-DAY OF DISCHARGE:  Subjective:   [employer] today has no headache,no chest abdominal pain,no new weakness tingling or numbness, feels much better wants to go home today.   Objective:   Blood pressure 110/65, pulse 85,  temperature 99.4 F (37.4 C), temperature source Oral, resp. rate 16, height 5\' 8"  (1.727 m), weight 70.7 kg (155 lb 13.8 oz), SpO2 100 %.  Intake/Output Summary (Last 24 hours) at 11/19/14 1110 Last data filed at 11/19/14 1049  Gross per 24 hour  Intake   1040 ml  Output   2075 ml  Net  -1035 ml   Filed Weights   11/17/14 0500 11/18/14 0729 11/19/14 0525  Weight: 68.221 kg (150 lb 6.4 oz) 69.264 kg (152 lb 11.2 oz) 70.7 kg (155 lb 13.8 oz)    Exam Awake Alert, Oriented *3, No new F.N deficits, Normal affect [place].AT,PERRAL Supple Neck,No JVD, No cervical lymphadenopathy appriciated.  Symmetrical Chest wall movement, Good air movement bilaterally, CTAB RRR,No Gallops,Rubs or new Murmurs, No Parasternal Heave +ve B.Sounds, Abd Soft, Non tender, No organomegaly appriciated, No rebound -guarding or rigidity. No Cyanosis, Clubbing or edema, No new Rash or bruise  DISCHARGE CONDITION: Stable  DISPOSITION: Home with home health services  DISCHARGE INSTRUCTIONS:    Activity:  As tolerated  Diet recommendation: Diabetic Diet  Discharge Instructions    Call MD for:  redness, tenderness, or signs of infection (pain, swelling, redness, odor or green/yellow discharge around incision site)    Complete by:  As directed      Diet Carb Modified  Complete by:  As directed      Discharge wound care:    Complete by:  As directed   Continue wound VAC per Dr Migdalia Dk orders     Increase activity slowly    Complete by:  As directed            Follow-up Information    Follow up with [facility]              In 1 week.   Contact information:   [address] [address] [phone]      Follow up with [name], MD. Schedule an appointment as soon as possible for a visit in 1 week.   Specialty:  Family Medicine   Contact information:   [place] [address] [phone]       Follow  up with [name], MD. Schedule an appointment as soon as possible for a visit in 2 weeks.   Specialty:  Infectious Diseases   Contact information:   [address] [place] [address] [phone]       Total Time spent on discharge equals 45 minutes.  SignedOren Binet 11/19/2014 11:10 AM

## 2014-11-19 NOTE — Progress Notes (Addendum)
Left arm fentanyl patch removed and right arm fentanyl patched placed.

## 2014-11-19 NOTE — Progress Notes (Addendum)
HO Ghimire paged, night shift nurse made me aware orders put in by surgeon yesterday expired last night and subsequently locked under her name only. Night shift RN stated she addressed this with night MD and were unable to unlock them. HO Ghimire paged to address this same issue as many of the patients day medications are still discontinued.

## 2014-11-22 ENCOUNTER — Telehealth: Payer: Self-pay | Admitting: Family Medicine

## 2014-11-22 NOTE — Anesthesia Postprocedure Evaluation (Signed)
Anesthesia Post Note  Patient: Jason [name]  Procedure(s) Performed: Procedure(s) (LRB): IRRIGATION AND DEBRIDEMENT OF SACRAL ULCER ONLY WITH PLACEMENT OF A CELL AND VAC/ DRESSING CHANGE TO UPPER BACK AREA. (N/A)  Anesthesia type: General  Patient location: PACU  Post pain: Pain level controlled and Adequate analgesia  Post assessment: Post-op Vital signs reviewed, Patient's Cardiovascular Status Stable, Respiratory Function Stable, Patent Airway and Pain level controlled  Last Vitals:  Filed Vitals:   11/19/14 1553  BP: 142/86  Pulse: 95  Temp: 37.1 C  Resp: 16    Post vital signs: Reviewed and stable  Level of consciousness: awake, alert  and oriented  Complications: No apparent anesthesia complications

## 2014-11-22 NOTE — Telephone Encounter (Signed)
Pt's lab will not be drawn today. Mom has already given pt his vancomycin today as well.  [facility] is closed.

## 2014-11-22 NOTE — Telephone Encounter (Signed)
noted 

## 2014-11-23 ENCOUNTER — Inpatient Hospital Stay: Payer: Medicaid Other | Admitting: Infectious Disease

## 2014-11-23 LAB — CULTURE, BLOOD (ROUTINE X 2)
Culture: NO GROWTH
Culture: NO GROWTH

## 2014-11-24 ENCOUNTER — Encounter: Payer: Self-pay | Admitting: Family Medicine

## 2014-11-25 ENCOUNTER — Encounter (HOSPITAL_BASED_OUTPATIENT_CLINIC_OR_DEPARTMENT_OTHER): Admission: RE | Payer: Self-pay | Source: Ambulatory Visit

## 2014-11-25 ENCOUNTER — Other Ambulatory Visit: Payer: Self-pay | Admitting: Plastic Surgery

## 2014-11-25 ENCOUNTER — Ambulatory Visit (HOSPITAL_COMMUNITY)
Admission: RE | Admit: 2014-11-25 | Discharge: 2014-11-25 | Disposition: A | Discharge status: Home / Self Care | Payer: Medicaid Other | Source: Ambulatory Visit | Attending: Plastic Surgery | Admitting: Plastic Surgery

## 2014-11-25 ENCOUNTER — Encounter: Payer: Self-pay | Admitting: Plastic Surgery

## 2014-11-25 ENCOUNTER — Ambulatory Visit (HOSPITAL_COMMUNITY): Payer: Medicaid Other | Admitting: Anesthesiology

## 2014-11-25 ENCOUNTER — Ambulatory Visit (HOSPITAL_BASED_OUTPATIENT_CLINIC_OR_DEPARTMENT_OTHER): Admission: RE | Admit: 2014-11-25 | Payer: Medicaid Other | Source: Ambulatory Visit | Admitting: Plastic Surgery

## 2014-11-25 ENCOUNTER — Telehealth: Payer: Self-pay | Admitting: Family Medicine

## 2014-11-25 ENCOUNTER — Encounter (HOSPITAL_COMMUNITY)
Admission: RE | Disposition: A | Discharge status: Home / Self Care | Payer: Self-pay | Source: Ambulatory Visit | Attending: Plastic Surgery

## 2014-11-25 DIAGNOSIS — L89159 Pressure ulcer of sacral region, unspecified stage: Secondary | ICD-10-CM | POA: Diagnosis not present

## 2014-11-25 DIAGNOSIS — T2134XD Burn of third degree of lower back, subsequent encounter: Secondary | ICD-10-CM | POA: Insufficient documentation

## 2014-11-25 DIAGNOSIS — L89154 Pressure ulcer of sacral region, stage 4: Secondary | ICD-10-CM

## 2014-11-25 HISTORY — PX: INCISION AND DRAINAGE OF WOUND: SHX1803

## 2014-11-25 HISTORY — PX: MINOR APPLICATION OF WOUND VAC: SHX6243

## 2014-11-25 LAB — BASIC METABOLIC PANEL
Anion gap: 3 — ABNORMAL LOW (ref 5–15)
Anion gap: 3 — ABNORMAL LOW (ref 5–15)
BUN: 8 mg/dL (ref 6–23)
BUN: 5 mg/dL — AB (ref 6–23)
CHLORIDE: 114 mmol/L — AB (ref 96–112)
CO2: 22 mmol/L (ref 19–32)
CO2: 20 mmol/L (ref 19–32)
CREATININE: 0.53 mg/dL (ref 0.50–1.35)
CREATININE: 0.49 mg/dL — AB (ref 0.50–1.35)
Calcium: 9.9 mg/dL (ref 8.4–10.5)
Calcium: 9 mg/dL (ref 8.4–10.5)
Chloride: 116 mmol/L — ABNORMAL HIGH (ref 96–112)
GFR calc Af Amer: >90 mL/min (ref >90)
GFR calc Af Amer: >90 mL/min (ref >90)
GFR calc non Af Amer: >90 mL/min (ref >90)
GFR calc non Af Amer: >90 mL/min (ref >90)
Glucose, Bld: 171 mg/dL — ABNORMAL HIGH (ref 70–99)
Glucose, Bld: 75 mg/dL (ref 70–99)
Potassium: 5.3 mmol/L — ABNORMAL HIGH (ref 3.5–5.1)
Potassium: 4.9 mmol/L (ref 3.5–5.1)
Sodium: 139 mmol/L (ref 135–145)
Sodium: 139 mmol/L (ref 135–145)

## 2014-11-25 LAB — CBC
HEMATOCRIT: 24.4 % — AB (ref 39.0–52.0)
Hemoglobin: 7.7 g/dL — ABNORMAL LOW (ref 13.0–17.0)
MCH: 26.6 pg (ref 26.0–34.0)
MCHC: 31.6 g/dL (ref 30.0–36.0)
MCV: 84.4 fL (ref 78.0–100.0)
Platelets: 666 10*3/uL — ABNORMAL HIGH (ref 150–400)
RBC: 2.89 MIL/uL — ABNORMAL LOW (ref 4.22–5.81)
RDW: 17.4 % — ABNORMAL HIGH (ref 11.5–15.5)
WBC: 14.2 10*3/uL — ABNORMAL HIGH (ref 4.0–10.5)

## 2014-11-25 LAB — GLUCOSE, CAPILLARY
GLUCOSE-CAPILLARY: 125 mg/dL — AB (ref 70–99)
Glucose-Capillary: 184 mg/dL — ABNORMAL HIGH (ref 70–99)
Glucose-Capillary: 100 mg/dL — ABNORMAL HIGH (ref 70–99)
Glucose-Capillary: 72 mg/dL (ref 70–99)
Glucose-Capillary: 81 mg/dL (ref 70–99)

## 2014-11-25 SURGERY — IRRIGATION AND DEBRIDEMENT WOUND
Anesthesia: General | Site: Coccyx

## 2014-11-25 SURGERY — IRRIGATION AND DEBRIDEMENT EXTREMITY
Anesthesia: General

## 2014-11-25 MED ORDER — INSULIN GLARGINE 100 UNIT/ML ~~LOC~~ SOLN
12.0000 [IU] | Freq: Once | SUBCUTANEOUS | Status: AC
  Administered 2014-11-25: 12 [IU] via SUBCUTANEOUS
  Filled 2014-11-25: qty 0.12

## 2014-11-25 MED ORDER — FENTANYL CITRATE 0.05 MG/ML IJ SOLN
INTRAMUSCULAR | Status: AC
  Filled 2014-11-25: qty 2

## 2014-11-25 MED ORDER — SODIUM CHLORIDE 0.9 % IV SOLN
INTRAVENOUS | Status: DC | PRN
  Administered 2014-11-25: 14:00:00 via INTRAVENOUS

## 2014-11-25 MED ORDER — PROPOFOL 10 MG/ML IV BOLUS
INTRAVENOUS | Status: DC | PRN
  Administered 2014-11-25: 120 mg via INTRAVENOUS

## 2014-11-25 MED ORDER — FENTANYL CITRATE 0.05 MG/ML IJ SOLN
25.0000 ug | INTRAMUSCULAR | Status: DC | PRN
  Administered 2014-11-25 (×4): 25 ug via INTRAVENOUS

## 2014-11-25 MED ORDER — ONDANSETRON HCL 4 MG/2ML IJ SOLN
INTRAMUSCULAR | Status: DC | PRN
  Administered 2014-11-25: 4 mg via INTRAVENOUS

## 2014-11-25 MED ORDER — VANCOMYCIN HCL 1000 MG IV SOLR: 1000.0000 mg | INTRAVENOUS | Status: DC

## 2014-11-25 MED ORDER — 0.9 % SODIUM CHLORIDE (POUR BTL) OPTIME
TOPICAL | Status: DC | PRN
  Administered 2014-11-25 (×2): 1000 mL

## 2014-11-25 MED ORDER — NEOSTIGMINE METHYLSULFATE 10 MG/10ML IV SOLN
INTRAVENOUS | Status: DC | PRN
  Administered 2014-11-25: 4 mg via INTRAVENOUS

## 2014-11-25 MED ORDER — HEPARIN SOD (PORK) LOCK FLUSH 10 UNIT/ML IV SOLN
INTRAVENOUS | Status: AC
  Filled 2014-11-25: qty 1

## 2014-11-25 MED ORDER — FENTANYL CITRATE 0.05 MG/ML IJ SOLN
100.0000 ug | Freq: Once | INTRAMUSCULAR | Status: AC
  Administered 2014-11-25: 100 ug via INTRAVENOUS

## 2014-11-25 MED ORDER — FENTANYL CITRATE 0.05 MG/ML IJ SOLN
INTRAMUSCULAR | Status: DC | PRN
  Administered 2014-11-25: 50 ug via INTRAVENOUS

## 2014-11-25 MED ORDER — MINERAL OIL LIGHT 100 % EX OIL
TOPICAL_OIL | CUTANEOUS | Status: AC
  Filled 2014-11-25: qty 25

## 2014-11-25 MED ORDER — VANCOMYCIN HCL IN DEXTROSE 1-5 GM/200ML-% IV SOLN
1000.0000 mg | Freq: Once | INTRAVENOUS | Status: AC
  Administered 2014-11-25: 1000 mg via INTRAVENOUS

## 2014-11-25 MED ORDER — ROCURONIUM BROMIDE 100 MG/10ML IV SOLN
INTRAVENOUS | Status: DC | PRN
  Administered 2014-11-25: 30 mg via INTRAVENOUS

## 2014-11-25 MED ORDER — VANCOMYCIN HCL IN DEXTROSE 1-5 GM/200ML-% IV SOLN
INTRAVENOUS | Status: AC
  Administered 2014-11-25: 1000 mg via INTRAVENOUS
  Filled 2014-11-25: qty 200

## 2014-11-25 MED ORDER — SODIUM CHLORIDE 0.9 % IR SOLN
Status: DC | PRN
  Administered 2014-11-25: 500 mL

## 2014-11-25 MED ORDER — GLYCOPYRROLATE 0.2 MG/ML IJ SOLN
INTRAMUSCULAR | Status: DC | PRN
  Administered 2014-11-25: 0.6 mg via INTRAVENOUS

## 2014-11-25 MED ORDER — MINERAL OIL LIGHT 100 % EX OIL
TOPICAL_OIL | CUTANEOUS | Status: DC | PRN
  Administered 2014-11-25: 1 via TOPICAL

## 2014-11-25 MED ORDER — HEPARIN SOD (PORK) LOCK FLUSH 10 UNIT/ML IV SOLN
10.0000 [IU] | Freq: Once | INTRAVENOUS | Status: AC
  Administered 2014-11-25: 10 [IU]

## 2014-11-25 MED ORDER — CIPROFLOXACIN IN D5W 400 MG/200ML IV SOLN
INTRAVENOUS | Status: AC
  Filled 2014-11-25: qty 200

## 2014-11-25 MED ORDER — MIDAZOLAM HCL 2 MG/2ML IJ SOLN
INTRAMUSCULAR | Status: DC | PRN
  Administered 2014-11-25: 2 mg via INTRAVENOUS

## 2014-11-25 MED ORDER — LACTATED RINGERS IV SOLN
INTRAVENOUS | Status: DC
  Administered 2014-11-25: 12:00:00 via INTRAVENOUS

## 2014-11-25 MED ORDER — LIDOCAINE HCL (CARDIAC) 20 MG/ML IV SOLN
INTRAVENOUS | Status: DC | PRN
  Administered 2014-11-25: 80 mg via INTRAVENOUS

## 2014-11-25 MED ORDER — CIPROFLOXACIN IN D5W 400 MG/200ML IV SOLN: 400.0000 mg | INTRAVENOUS | Status: DC

## 2014-11-25 MED ORDER — ONDANSETRON HCL 4 MG/2ML IJ SOLN: 4.0000 mg | Freq: Once | INTRAMUSCULAR | Status: DC | PRN

## 2014-11-25 SURGICAL SUPPLY — 58 items
BAG DECANTER FOR FLEXI CONT (MISCELLANEOUS) IMPLANT
BENZOIN TINCTURE PRP APPL 2/3 (GAUZE/BANDAGES/DRESSINGS) ×8 IMPLANT
BLADE 10 SAFETY STRL DISP (BLADE) IMPLANT
BLADE DERMATOME II (BLADE) ×8 IMPLANT
BLADE SURG ROTATE 9660 (MISCELLANEOUS) IMPLANT
BNDG GAUZE ELAST 4 BULKY (GAUZE/BANDAGES/DRESSINGS) IMPLANT
CANISTER SUCTION 2500CC (MISCELLANEOUS) ×4 IMPLANT
CHLORAPREP W/TINT 26ML (MISCELLANEOUS) IMPLANT
CONT SPEC STER OR (MISCELLANEOUS) IMPLANT
COVER SURGICAL LIGHT HANDLE (MISCELLANEOUS) ×4 IMPLANT
DEPRESSOR TONGUE BLADE STERILE (MISCELLANEOUS) ×4 IMPLANT
DERMACARRIERS GRAFT 1 TO 1.5 (DISPOSABLE) ×4
DRAPE IMP U-DRAPE 54X76 (DRAPES) IMPLANT
DRAPE INCISE IOBAN 66X45 STRL (DRAPES) ×4 IMPLANT
DRAPE LAPAROSCOPIC ABDOMINAL (DRAPES) ×4 IMPLANT
DRAPE PED LAPAROTOMY (DRAPES) IMPLANT
DRAPE PROXIMA HALF (DRAPES) IMPLANT
DRSG ADAPTIC 3X8 NADH LF (GAUZE/BANDAGES/DRESSINGS) ×20 IMPLANT
DRSG PAD ABDOMINAL 8X10 ST (GAUZE/BANDAGES/DRESSINGS) IMPLANT
DRSG VAC ATS LRG SENSATRAC (GAUZE/BANDAGES/DRESSINGS) IMPLANT
DRSG VAC ATS MED SENSATRAC (GAUZE/BANDAGES/DRESSINGS) ×4 IMPLANT
DRSG VAC ATS SM SENSATRAC (GAUZE/BANDAGES/DRESSINGS) IMPLANT
ELECT CAUTERY BLADE 6.4 (BLADE) ×4 IMPLANT
ELECT REM PT RETURN 9FT ADLT (ELECTROSURGICAL) ×4
ELECTRODE REM PT RTRN 9FT ADLT (ELECTROSURGICAL) ×2 IMPLANT
GAUZE SPONGE 4X4 12PLY STRL (GAUZE/BANDAGES/DRESSINGS) IMPLANT
GLOVE BIO SURGEON STRL SZ 6.5 (GLOVE) ×21 IMPLANT
GLOVE BIO SURGEON STRL SZ7.5 (GLOVE) ×4 IMPLANT
GLOVE BIO SURGEONS STRL SZ 6.5 (GLOVE) ×7
GLOVE BIOGEL PI IND STRL 6.5 (GLOVE) ×2 IMPLANT
GLOVE BIOGEL PI IND STRL 7.5 (GLOVE) ×2 IMPLANT
GLOVE BIOGEL PI INDICATOR 6.5 (GLOVE) ×2
GLOVE BIOGEL PI INDICATOR 7.5 (GLOVE) ×2
GLOVE ECLIPSE 7.5 STRL STRAW (GLOVE) ×4 IMPLANT
GOWN STRL REUS W/ TWL LRG LVL3 (GOWN DISPOSABLE) ×8 IMPLANT
GOWN STRL REUS W/TWL LRG LVL3 (GOWN DISPOSABLE) ×8
GRAFT DERMACARRIERS 1 TO 1.5 (DISPOSABLE) ×2 IMPLANT
KIT BASIN OR (CUSTOM PROCEDURE TRAY) ×4 IMPLANT
KIT ROOM TURNOVER OR (KITS) ×4 IMPLANT
MATRIX SURGICAL PSM 7X10CM (Tissue) ×4 IMPLANT
MATRIX SURGICAL PSMX 10X15CM (Tissue) ×4 IMPLANT
MICROMATRIX 1000MG (Tissue) ×4 IMPLANT
NS IRRIG 1000ML POUR BTL (IV SOLUTION) ×4 IMPLANT
PACK GENERAL/GYN (CUSTOM PROCEDURE TRAY) ×4 IMPLANT
PACK UNIVERSAL I (CUSTOM PROCEDURE TRAY) IMPLANT
PAD ABD 8X10 STRL (GAUZE/BANDAGES/DRESSINGS) ×12 IMPLANT
PAD ARMBOARD 7.5X6 YLW CONV (MISCELLANEOUS) ×8 IMPLANT
SOLUTION PARTIC MCRMTRX 1000MG (Tissue) ×2 IMPLANT
SPONGE LAP 18X18 X RAY DECT (DISPOSABLE) ×4 IMPLANT
STAPLER VISISTAT 35W (STAPLE) ×4 IMPLANT
SURGILUBE 2OZ TUBE FLIPTOP (MISCELLANEOUS) ×8 IMPLANT
SUT VIC AB 5-0 PS2 18 (SUTURE) ×20 IMPLANT
SWAB COLLECTION DEVICE MRSA (MISCELLANEOUS) IMPLANT
SYR BULB IRRIGATION 50ML (SYRINGE) ×4 IMPLANT
TOWEL OR 17X24 6PK STRL BLUE (TOWEL DISPOSABLE) IMPLANT
TOWEL OR 17X26 10 PK STRL BLUE (TOWEL DISPOSABLE) ×4 IMPLANT
TUBE ANAEROBIC SPECIMEN COL (MISCELLANEOUS) IMPLANT
UNDERPAD 30X30 INCONTINENT (UNDERPADS AND DIAPERS) ×4 IMPLANT

## 2014-11-25 NOTE — Op Note (Signed)
Operative Note   DATE OF OPERATION: 11/25/2014  LOCATION: [name]  SURGICAL DIVISION: Plastic Surgery  PREOPERATIVE DIAGNOSES:  Sacral / ischial ulcer x 2 (5 x 5 x 1.5 cm sacral and 4 x 6 x 3 cm ischial) and full thickness Back burn 15 x 15 cm  POSTOPERATIVE DIAGNOSES:  same  PROCEDURE:  Preparation of sacral ischial ulcers with placement of Acell (1 gm and sheets 7 x 10 cm) and VAC. Debridement of skin and soft tissue. Excision of full thickness skin burn on the back 15 x 15 cm. Placement of Acell to back burn 10 x 15 cm sheet.  SURGEON: Theodoro Kos, DO  ASSISTANT: Shawn Rayburn, PA  ANESTHESIA:  General.   COMPLICATIONS: None.   INDICATIONS FOR PROCEDURE:  The patient, Jason [name] is a 29 y.o. male born on 07-Sep-1986, is here for treatment of sacral ischial ulcers. MRN: [id number]  CONSENT:  Informed consent was obtained directly from the patient. Risks, benefits and alternatives were fully discussed. Specific risks including but not limited to bleeding, infection, hematoma, seroma, scarring, pain, infection, contracture, asymmetry, wound healing problems, and need for further surgery were all discussed. The patient did have an ample opportunity to have questions answered to satisfaction.   DESCRIPTION OF PROCEDURE:  The patient was taken to the operating room. The patient's operative site was prepped and draped in a sterile fashion. A time out was performed and all information was confirmed to be correct.  General  anesthesia was administered.  The #10 blade and scissors were used to debride the area of the sacral and ischial ulcers (5 x 5 x 1.5 cm sacral and 4 x 6 x 3 cm ischial).  The area was irrigated with antibiotic solution.  The bovie was used for hemostasis.  The Acell powder 1 gm and sheet 7 x 10 cm were applied and secured with 5-0 Vicryl.  An adaptic, KY gel and VAC were placed and there was an excellent seal.    A dermatome was used to excise  the back burn 15 x 15 cm.  The Acell sheet was then applied and secured with 5-0 Vicryl.  Two adaptic sheet and KY gel were placed.  ABDs were placed The patient tolerated the procedure well.  There were no complications. The patient was allowed to wake from anesthesia, extubated and taken to the recovery room in satisfactory condition.

## 2014-11-25 NOTE — Anesthesia Postprocedure Evaluation (Signed)
  Anesthesia Post-op Note  Patient: Jason [name]  Procedure(s) Performed: Procedure(s): IRRIGATION AND DEBRIDEMENT OF SACRAL ULCER AND BACK BURN WITH PLACEMENT OF A-CELL (N/A)  WOUND VAC CHANGE (N/A)  Patient Location: PACU  Anesthesia Type:General  Level of Consciousness: awake and alert   Airway and Oxygen Therapy: Patient Spontanous Breathing  Post-op Pain: none  Post-op Assessment: Post-op Vital signs reviewed  Post-op Vital Signs: Reviewed  Last Vitals:  Filed Vitals:   11/25/14 2200  BP: 109/90  Pulse: 78  Temp: 36.4 C  Resp: 16    Complications: No apparent anesthesia complications

## 2014-11-25 NOTE — Progress Notes (Signed)
Dr. Migdalia Dk at stretcher side. Patients mother voiced concern about consent form. Dr. Migdalia Dk then gave a verbal order for nurse to update consent form to "Debridement of sacral ulcer with placement of Acell and VAC also back and right ischial" reason "Sacral ulcer and back ulcer and right ischial."

## 2014-11-25 NOTE — Anesthesia Preprocedure Evaluation (Signed)
Anesthesia Evaluation  Patient identified by MRN, date of birth, ID band Patient awake    Reviewed: Allergy & Precautions, NPO status , Patient's Chart, lab work & pertinent test results, Unable to perform ROS - Chart review only  Airway Mallampati: II   Neck ROM: Full    Dental   Pulmonary Current Smoker,  breath sounds clear to auscultation        Cardiovascular Rhythm:Regular Rate:Normal     Neuro/Psych    GI/Hepatic   Endo/Other  diabetes  Renal/GU      Musculoskeletal   Abdominal   Peds  Hematology   Anesthesia Other Findings   Reproductive/Obstetrics                             Anesthesia Physical Anesthesia Plan  ASA: II  Anesthesia Plan: General   Post-op Pain Management:    Induction: Intravenous  Airway Management Planned: Oral ETT  Additional Equipment:   Intra-op Plan:   Post-operative Plan:   Informed Consent: I have reviewed the patients History and Physical, chart, labs and discussed the procedure including the risks, benefits and alternatives for the proposed anesthesia with the patient or authorized representative who has indicated his/her understanding and acceptance.   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

## 2014-11-25 NOTE — Transfer of Care (Signed)
Immediate Anesthesia Transfer of Care Note  Patient: Jason [name]  Procedure(s) Performed: Procedure(s): IRRIGATION AND DEBRIDEMENT OF SACRAL ULCER AND BACK BURN WITH PLACEMENT OF A-CELL (N/A)  WOUND VAC CHANGE (N/A)  Patient Location: PACU  Anesthesia Type:General  Level of Consciousness: awake, alert  and oriented  Airway & Oxygen Therapy: Patient Spontanous Breathing and Patient connected to nasal cannula oxygen  Post-op Assessment: Report given to RN and Post -op Vital signs reviewed and stable  Post vital signs: Reviewed and stable  Last Vitals:  Filed Vitals:   11/25/14 0956  BP: 88/58  Pulse: 80  Temp: 36.7 C  Resp: 18    Complications: No apparent anesthesia complications

## 2014-11-25 NOTE — Brief Op Note (Signed)
11/25/2014  2:10 PM  PATIENT:  Jason [name]  29 y.o. male  PRE-OPERATIVE DIAGNOSIS:  sacral ulcer  POST-OPERATIVE DIAGNOSIS:  sacral ulcer  PROCEDURE:  Procedure(s): IRRIGATION AND DEBRIDEMENT OF SACRAL ULCER AND BACK BURN WITH PLACEMENT OF A-CELL (N/A)  WOUND VAC CHANGE (N/A)  SURGEON:  Surgeon(s) and Role:    * Tahari Clabaugh Sanger, DO - Primary  PHYSICIAN ASSISTANT: Shawn Rayburn, PA  ASSISTANTS: none   ANESTHESIA:   general  EBL:     BLOOD ADMINISTERED:none  DRAINS: none   LOCAL MEDICATIONS USED:  NONE  SPECIMEN:  No Specimen  DISPOSITION OF SPECIMEN:  N/A  COUNTS:  YES  TOURNIQUET:  * No tourniquets in log *  DICTATION: .Dragon Dictation  PLAN OF CARE: Discharge to home after PACU  PATIENT DISPOSITION:  PACU - hemodynamically stable.   Delay start of Pharmacological VTE agent (>24hrs) due to surgical blood loss or risk of bleeding: no

## 2014-11-25 NOTE — Progress Notes (Signed)
Dr. Migdalia Dk notified, ok to continue with 1L of fluid bolus.  Requested to call back after the fluids, and if patient is still hypotensive to call back

## 2014-11-25 NOTE — Interval H&P Note (Signed)
History and Physical Interval Note:  11/25/2014 7:03 AM  [place]  has presented today for surgery, with the diagnosis of sacral ulcer  The various methods of treatment have been discussed with the patient and family. After consideration of risks, benefits and other options for treatment, the patient has consented to  Procedure(s): IRRIGATION AND DEBRIDEMENT SACRAL ULCER PLACEMENT OF A-CELL (N/A)  WOUND VAC CHANGE (N/A) as a surgical intervention .  The patient's history has been reviewed, patient examined, no change in status, stable for surgery.  I have reviewed the patient's chart and labs.  Questions were answered to the patient's satisfaction.     SANGER,Mannie Wineland

## 2014-11-25 NOTE — Progress Notes (Signed)
Mother at bedside/ [facility] contacted for stretcher transport, dispatcher staes pick up time will be between now and 7pm, mother made aware and dinner tray requested for patient

## 2014-11-25 NOTE — Progress Notes (Signed)
Dr Linna Caprice informed of low bp.  GIve 1L NS and insulin, continue to monitor

## 2014-11-25 NOTE — Telephone Encounter (Signed)
Order to resume previous nurising orders- wound care & vac, weekly blood draw and pic line care. Call Helene Kelp at [phone] with verbal orders.

## 2014-11-25 NOTE — H&P (View-Only) (Signed)
5 Days Post-Op  Subjective: The patient is in the bed with no complaints at present.  VAC is working and has a seal.  Objective: Vital signs in last 24 hours: Temp:  [98.2 F (36.8 C)-98.3 F (36.8 C)] 98.2 F (36.8 C) (02/10 0825) Pulse Rate:  [83-109] 85 (02/10 0825) Resp:  [12-18] 17 (02/10 0825) BP: (106-114)/(64-78) 106/64 mmHg (02/10 0825) SpO2:  [100 %] 100 % (02/10 0825) Weight:  [68.221 kg (150 lb 6.4 oz)] 68.221 kg (150 lb 6.4 oz) (02/10 0500) Last BM Date: 11/16/14  Intake/Output from previous day: 02/09 0701 - 02/10 0700 In: 2100 [P.O.:100; I.V.:1500; IV Piggyback:500] Out: 4600 [Urine:3200; PTWSF:6812] Intake/Output this shift:    General appearance: alert, cooperative and no distress Incision/Wound: VAC in place.  Lab Results:   Recent Labs  11/15/14 0225 11/16/14 0500  WBC 28.5* 23.1*  HGB 8.5* 8.2*  HCT 26.3* 25.4*  PLT 582* 520*   BMET  Recent Labs  11/15/14 0225 11/15/14 1917  NA 131* 130*  K 5.5* 5.3*  CL 104 104  CO2 23 24  GLUCOSE 174* 153*  BUN 11 14  CREATININE 0.75 0.83  CALCIUM 9.9 10.0   PT/INR No results for input(s): LABPROT, INR in the last 72 hours. ABG No results for input(s): PHART, HCO3 in the last 72 hours.  Invalid input(s): PCO2, PO2  Studies/Results: Ct Chest W Contrast  11/15/2014   CLINICAL DATA:  Pleural effusion. Left lower lobe opacity on chest radiograph. Increasing white count on appropriate antibiotics.  EXAM: CT CHEST WITH CONTRAST  TECHNIQUE: Multidetector CT imaging of the chest was performed during intravenous contrast administration.  CONTRAST:  59mL OMNIPAQUE IOHEXOL 300 MG/ML  SOLN  COMPARISON:  Chest 11/15/2014.  FINDINGS: Focal areas of consolidation in both lung bases, greater on the left, with small left pleural effusion. No evidence of loculated fluid to suggest empyema. Air bronchograms are present with suggestion of mild bronchiectasis. Findings likely represent multifocal pneumonia. No  pneumothorax.  Normal heart size. Normal caliber thoracic aorta. Great vessel origins are patent. No significant lymphadenopathy in the chest. Esophagus is mildly dilated with fluid and debris demonstrated throughout the esophagus. This may be due to reflux or dysmotility.  Included portions of the upper abdominal organs are grossly unremarkable. No destructive bone lesions.  IMPRESSION: Focal areas of consolidation in both lung bases, greater on the left. Small left pleural effusion. Findings likely to represent pneumonia. No evidence of empyema. Mildly dilated esophagus filled with fluid and debris. This could be due to reflux or dysmotility.   Electronically Signed   By: Lucienne Capers M.D.   On: 11/15/2014 22:08   Dg Chest Port 1 View  11/15/2014   CLINICAL DATA:  Subsequent encounter for pneumonia.  EXAM: PORTABLE CHEST - 1 VIEW  COMPARISON:  11/08/2014  FINDINGS: The right PICC line is stable. The tip is near the cavoatrial junction. The cardiac silhouette, mediastinal and hilar contours are within normal limits and stable. Persistent left lower lobe process, likely pneumonia and small parapneumonic effusion. The right lung remains clear.  IMPRESSION: Persistent left lower lobe process likely a combination of infiltrate and effusion.   Electronically Signed   By: Kalman Jewels M.D.   On: 11/15/2014 12:47    Anti-infectives: Anti-infectives    Start     Dose/Rate Route Frequency Ordered Stop   11/16/14 1000  vancomycin (VANCOCIN) IVPB 750 mg/150 ml premix     750 mg 150 mL/hr over 60 Minutes Intravenous Every 12  hours 11/16/14 0957     11/15/14 1700  vancomycin (VANCOCIN) IVPB 750 mg/150 ml premix  Status:  Discontinued     750 mg 150 mL/hr over 60 Minutes Intravenous Every 8 hours 11/15/14 1610 11/16/14 0957   11/12/14 1317  polymyxin B 500,000 Units, bacitracin 50,000 Units in sodium chloride irrigation 0.9 % 500 mL irrigation  Status:  Discontinued       As needed 11/12/14 1319 11/12/14  1417   11/09/14 1400  linezolid (ZYVOX) tablet 600 mg  Status:  Discontinued     600 mg Oral Every 12 hours 11/09/14 1319 11/15/14 1542   11/09/14 0700  vancomycin (VANCOCIN) 500 mg in sodium chloride 0.9 % 100 mL IVPB  Status:  Discontinued     500 mg 100 mL/hr over 60 Minutes Intravenous Every 12 hours 11/08/14 1732 11/08/14 1737   11/08/14 2200  vancomycin (VANCOCIN) IVPB 750 mg/150 ml premix  Status:  Discontinued     750 mg 150 mL/hr over 60 Minutes Intravenous Every 24 hours 11/08/14 1737 11/09/14 1319   11/08/14 1800  meropenem (MERREM) 1 g in sodium chloride 0.9 % 100 mL IVPB     1 g 200 mL/hr over 30 Minutes Intravenous Every 8 hours 11/08/14 1732     11/08/14 1745  vancomycin (VANCOCIN) 1,250 mg in sodium chloride 0.9 % 250 mL IVPB  Status:  Discontinued     1,250 mg 166.7 mL/hr over 90 Minutes Intravenous  Once 11/08/14 1732 11/08/14 1734      Assessment/Plan: s/p Procedure(s): IRRIGATION AND DEBRIDEMENT OF WOUNDS WITH BONE BIOPSY AND SURGICAL PREP  (N/A) APPLICATION A CELL AND VAC  (N/A) Plan for OR for VAC change.  LOS: 9 days    [facility] 11/17/2014

## 2014-11-25 NOTE — Progress Notes (Signed)
Came to PACU to cap off PICC for discharge however his BP is very low.  Staff will notify IV Team later when pt is ready to be discharged.

## 2014-11-25 NOTE — Telephone Encounter (Signed)
Per Dr. Sarajane Jews okay to give verbal order to resume care. I did speak with Helene Kelp and gave this information.

## 2014-11-25 NOTE — Discharge Instructions (Addendum)
Apply KY gel to the back daily starting on Saturday. Do not change VAC Keep off the back  What to eat:  For your first meals, you should eat lightly; only small meals initially.  If you do not have nausea, you may eat larger meals.  Avoid spicy, greasy and heavy food.    General Anesthesia, Adult, Care After  Refer to this sheet in the next few weeks. These instructions provide you with information on caring for yourself after your procedure. Your health care provider may also give you more specific instructions. Your treatment has been planned according to current medical practices, but problems sometimes occur. Call your health care provider if you have any problems or questions after your procedure.  WHAT TO EXPECT AFTER THE PROCEDURE  After the procedure, it is typical to experience:  Sleepiness.  Nausea and vomiting. HOME CARE INSTRUCTIONS  For the first 24 hours after general anesthesia:  Have a responsible person with you.  Do not drive a car. If you are alone, do not take public transportation.  Do not drink alcohol.  Do not take medicine that has not been prescribed by your health care provider.  Do not sign important papers or make important decisions.  You may resume a normal diet and activities as directed by your health care provider.  Change bandages (dressings) as directed.  If you have questions or problems that seem related to general anesthesia, call the hospital and ask for the anesthetist or anesthesiologist on call. SEEK MEDICAL CARE IF:  You have nausea and vomiting that continue the day after anesthesia.  You develop a rash. SEEK IMMEDIATE MEDICAL CARE IF:  You have difficulty breathing.  You have chest pain.  You have any allergic problems. Document Released: 12/31/2000 Document Revised: 05/27/2013 Document Reviewed: 04/09/2013  [facility] Patient Information 2014 [place], [place].   Sore Throat    A sore throat is a painful, burning, sore, or scratchy  feeling of the throat. There may be pain or tenderness when swallowing or talking. You may have other symptoms with a sore throat. These include coughing, sneezing, fever, or a swollen neck. A sore throat is often the first sign of another sickness. These sicknesses may include a cold, flu, strep throat, or an infection called mono. Most sore throats go away without medical treatment.  HOME CARE  Only take medicine as told by your doctor.  Drink enough fluids to keep your pee (urine) clear or pale yellow.  Rest as needed.  Try using throat sprays, lozenges, or suck on hard candy (if older than 4 years or as told).  Sip warm liquids, such as broth, herbal tea, or warm water with honey. Try sucking on frozen ice pops or drinking cold liquids.  Rinse the mouth (gargle) with salt water. Mix 1 teaspoon salt with 8 ounces of water.  Do not smoke. Avoid being around others when they are smoking.  Put a humidifier in your bedroom at night to moisten the air. You can also turn on a hot shower and sit in the bathroom for 5-10 minutes. Be sure the bathroom door is closed. GET HELP RIGHT AWAY IF:  You have trouble breathing.  You cannot swallow fluids, soft foods, or your spit (saliva).  You have more puffiness (swelling) in the throat.  Your sore throat does not get better in 7 days.  You feel sick to your stomach (nauseous) and throw up (vomit).  You have a fever or lasting symptoms for more than  2-3 days.  You have a fever and your symptoms suddenly get worse. MAKE SURE YOU:  Understand these instructions.  Will watch your condition.  Will get help right away if you are not doing well or get worse. Document Released: 07/03/2008 Document Revised: 06/18/2012 Document Reviewed: 06/01/2012  [facility] Patient Information 2015 [place], [place]. This information is not intended to replace advice given to you by your health care provider. Make sure you discuss any questions you have with your health care  provider.

## 2014-11-27 ENCOUNTER — Encounter (HOSPITAL_COMMUNITY): Payer: Self-pay | Admitting: Plastic Surgery

## 2014-11-29 ENCOUNTER — Encounter: Payer: Medicaid Other | Attending: Registered Nurse | Admitting: Physical Medicine & Rehabilitation

## 2014-11-29 ENCOUNTER — Encounter: Payer: Self-pay | Admitting: Physical Medicine & Rehabilitation

## 2014-11-29 VITALS — BP 107/64 | HR 87 | Resp 14

## 2014-11-29 DIAGNOSIS — G9519 Other vascular myelopathies: Secondary | ICD-10-CM | POA: Diagnosis present

## 2014-11-29 DIAGNOSIS — G9511 Acute infarction of spinal cord (embolic) (nonembolic): Secondary | ICD-10-CM

## 2014-11-29 DIAGNOSIS — N390 Urinary tract infection, site not specified: Secondary | ICD-10-CM | POA: Insufficient documentation

## 2014-11-29 DIAGNOSIS — E1140 Type 2 diabetes mellitus with diabetic neuropathy, unspecified: Secondary | ICD-10-CM | POA: Diagnosis present

## 2014-11-29 DIAGNOSIS — Z79899 Other long term (current) drug therapy: Secondary | ICD-10-CM | POA: Insufficient documentation

## 2014-11-29 DIAGNOSIS — Z5181 Encounter for therapeutic drug level monitoring: Secondary | ICD-10-CM | POA: Diagnosis present

## 2014-11-29 DIAGNOSIS — N319 Neuromuscular dysfunction of bladder, unspecified: Secondary | ICD-10-CM | POA: Insufficient documentation

## 2014-11-29 DIAGNOSIS — E1041 Type 1 diabetes mellitus with diabetic mononeuropathy: Secondary | ICD-10-CM | POA: Diagnosis present

## 2014-11-29 DIAGNOSIS — M869 Osteomyelitis, unspecified: Secondary | ICD-10-CM

## 2014-11-29 DIAGNOSIS — IMO0002 Reserved for concepts with insufficient information to code with codable children: Secondary | ICD-10-CM

## 2014-11-29 DIAGNOSIS — G8250 Quadriplegia, unspecified: Secondary | ICD-10-CM | POA: Insufficient documentation

## 2014-11-29 DIAGNOSIS — E1065 Type 1 diabetes mellitus with hyperglycemia: Secondary | ICD-10-CM

## 2014-11-29 DIAGNOSIS — E1040 Type 1 diabetes mellitus with diabetic neuropathy, unspecified: Secondary | ICD-10-CM

## 2014-11-29 DIAGNOSIS — L89159 Pressure ulcer of sacral region, unspecified stage: Secondary | ICD-10-CM

## 2014-11-29 MED ORDER — OXYCODONE-ACETAMINOPHEN 10-325 MG PO TABS: 1.0000 | ORAL_TABLET | Freq: Three times a day (TID) | ORAL | Status: DC

## 2014-11-29 MED ORDER — BACLOFEN 10 MG PO TABS: 10.0000 mg | ORAL_TABLET | Freq: Three times a day (TID) | ORAL | Status: DC

## 2014-11-29 MED ORDER — PREGABALIN 100 MG PO CAPS: 100.0000 mg | ORAL_CAPSULE | Freq: Four times a day (QID) | ORAL | Status: DC

## 2014-11-29 MED ORDER — FENTANYL 25 MCG/HR TD PT72: 25.0000 ug | MEDICATED_PATCH | TRANSDERMAL | Status: DC

## 2014-11-29 NOTE — Progress Notes (Signed)
Subjective:    Patient ID: Jason [name], male    DOB: March 20, 1986, 29 y.o.   MRN: [id number]  Jason [name] is back regarding his SCI. He has been out of the hospital for a week or so after his wound debridement. He is now on a vacuum therapy as well as calcium alginate in areas where the vac is not attached. Mom tells me that he developed a blister on his back--these tend to appear when he has an infection. He is followed by ID for his osteomyelitis and other various infections.    His pain is most severe in his low back and around his shoulders. He has been wearing a 56mcg fentanyl patch. He also takes percocet 10/325 q8 prn.  Baclofen remains scheduled TID. Lyrica is 100mg  TID and 200mg  at HS.   He continues with ostomy, foley.       Pain Inventory Average Pain 9 Pain Right Now 9 My pain is sharp, burning and aching  In the last 24 hours, has pain interfered with the following? General activity 1 Relation with others 3 Enjoyment of life 3 What TIME of day is your pain at its worst? morning, night  Sleep (in general) Fair  Pain is worse with: sitting and inactivity Pain improves with: medication Relief from Meds: 7  Mobility ability to climb steps?  no do you drive?  no use a wheelchair needs help with transfers Do you have any goals in this area?  yes  Function disabled: date disabled 01/29/2014 I need assistance with the following:  feeding, dressing, bathing, toileting, meal prep, household duties and shopping Do you have any goals in this area?  yes  Neuro/Psych No problems in this area  Prior Studies Any changes since last visit?  yes bone scan CT/MRI  Physicians involved in your care Any changes since last visit?  yes   Family History  Problem Relation Age of Onset  . Diabetes Father   . Hypertension Father   . Asthma      fhx  . Hypertension      fhx  . Stroke      fhx  . Heart disease Mother    History   Social History  .  Marital Status: Single    Spouse Name: N/A  . Number of Children: 0  . Years of Education: 11   Occupational History  . Disability    Social History Main Topics  . Smoking status: Current Some Day Smoker    Types: Cigars, Cigarettes    Last Attempt to Quit: 01/29/2014  . Smokeless tobacco: Never Used     Comment: e-cigarette and cigars currently  . Alcohol Use: No     Comment: rarely  . Drug Use: No  . Sexual Activity: Not on file   Other Topics Concern  . None   Social History Narrative   Patient lives at home with mother father.    Patient has 2 children.    Patient is single.    Patient was left hand but after stroke patient is now right handed.    Patient has  11th grade education.    Past Surgical History  Procedure Laterality Date  . Tonsillectomy    . Multiple extractions with alveoloplasty N/A 08/03/2014    Procedure: MULTIPLE EXTRACTIONS;  Surgeon: Gae Bon, DDS;  Location: [facility];  Service: Oral Surgery;  Laterality: N/A;  . Tee without cardioversion N/A 08/17/2014    Procedure: TRANSESOPHAGEAL ECHOCARDIOGRAM (  TEE);  Surgeon: Dorothy Spark, MD;  Location: [facility];  Service: Cardiovascular;  Laterality: N/A;  . Debridment of decubitus ulcer N/A 10/04/2014    Procedure: DEBRIDMENT OF DECUBITUS ULCER;  Surgeon: Georganna Skeans, MD;  Location: [facility];  Service: General;  Laterality: N/A;  . Laparoscopic diverted colostomy N/A 10/12/2014    Procedure: LAPAROSCOPIC DIVERTING COLOSTOMY;  Surgeon: Donnie Mesa, MD;  Location: [facility];  Service: General;  Laterality: N/A;  . Insertion of suprapubic catheter N/A 10/12/2014    Procedure: INSERTION OF SUPRAPUBIC CATHETER;  Surgeon: Reece Packer, MD;  Location: [facility];  Service: Urology;  Laterality: N/A;  . Incision and drainage of wound N/A 11/12/2014    Procedure: IRRIGATION AND DEBRIDEMENT OF WOUNDS WITH BONE BIOPSY AND SURGICAL PREP ;  Surgeon: Theodoro Kos, DO;  Location: [facility];  Service: Plastics;   Laterality: N/A;  . Application of a-cell of back N/A 11/12/2014    Procedure: APPLICATION A CELL AND VAC ;  Surgeon: Theodoro Kos, DO;  Location: [facility];  Service: Plastics;  Laterality: N/A;  . Incision and drainage of wound N/A 11/18/2014    Procedure: IRRIGATION AND DEBRIDEMENT OF SACRAL ULCER ONLY WITH PLACEMENT OF A CELL AND VAC/ DRESSING CHANGE TO UPPER BACK AREA.;  Surgeon: Theodoro Kos, DO;  Location: [facility];  Service: Plastics;  Laterality: N/A;  . Incision and drainage of wound N/A 11/25/2014    Procedure: IRRIGATION AND DEBRIDEMENT OF SACRAL ULCER AND BACK BURN WITH PLACEMENT OF A-CELL;  Surgeon: Theodoro Kos, DO;  Location: [facility];  Service: Plastics;  Laterality: N/A;  . Minor application of wound vac N/A 11/25/2014    Procedure:  WOUND VAC CHANGE;  Surgeon: Theodoro Kos, DO;  Location: [facility];  Service: Plastics;  Laterality: N/A;   Past Medical History  Diagnosis Date  . GERD (gastroesophageal reflux disease)   . Asthma   . Hx MRSA infection     on face  . Gastroparesis   . Diabetic neuropathy   . Seizures   . Stroke     spinal stroke in 4/15  . Diabetes mellitus     sees Dr. Loanne Drilling   . Family history of anesthesia complication     Pt mother can't have epidural procedures  . Dysrhythmia   . Pneumonia   . Arthritis   . Fibromyalgia    BP 107/64 mmHg  Pulse 87  Resp 14  SpO2 100%  Opioid Risk Score:   Fall Risk Score: Moderate Fall Risk (6-13 points)  Review of Systems  Respiratory: Positive for cough.   All other systems reviewed and are negative.      Objective:   Physical Exam  HEENT: normal  Cardio: RRR and no murmur  Resp: CTA B/L and unlabored  GI: BS positive and mildly distended and tympanitic, mild tenderness  Extremity: No Edema  Skin: wounds dressed on feet, left hand, back, sacrum. Vac in place Neuro: Alert/Oriented, Abnormal Sensory absent in feet and Abnormal Motor 0/5 in LLE RLE: tr to 1/5 at knee. ,tr 1/5 finger extension and tr hand  intrinsic bilaterally. Wrist extension 4+ to 5/5 right and 3- on left, 4/5 tricep right, 2+ left, biceps 5/5. Decreased sensation below elbows bilaterally, minimal pain/LT at left hand. Poor trunk control. Posture poor.  Musc/Skel: Generalized upper thoracic and lumbar spine tenderness. He sits with poor/slumped posture in his w/c. His seat belt for w/c is about 6 inches too small (at least!).  Gen NAD. He has lost  weight.  Psych: fairly alert, appropriate  GU: foley in place.    Assessment/Plan:  1. 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 Chronic Pain Management:  - baclofen for muscle spasms---ulimately will titrate to 10mg  bid and 20mg  qhs  -lyrica 100mg  tid and 200mg  qhs  -can continue with percocet 10/325 one q8 prn #90 -fentanyl patch 84mcg. Although, it's not my first choice given his skin issues, it seems to be working for him. Also, if he becomes ill, he can still receive the medication because it's transdermal.  7. DM type 2 with gastroparesis: continues to be brittle---follow up per endocrine/pcp  -needs better control  8. Chronic wounds: home health continues to follow open areas  -iv abx per ID  -local care per wound care nurse.  -reviewed the importance of better nutrition---latest albumin 1.9!!! 9. Constipation/diabetic gastroparesis/neurogenic bowel. ostomy 10. Severe diabetic peripheral neuropathy: lyrica---continue 100mg  TID and 200mg  QHS 11. Pulmonary: deep breathing, IS/FV, CPAP .  -quad cough, assistive techniques  13. Power wheelchair: needs a reclining back chair with better lumbar and thoracic support, elevating leg rest and appropriate sized seat belt.   -has not received the new chair yet---the process is still underway  -i will help in anyway i can 14. Neurogenic bladder: foley    I or NP will see him back in one month for follow up. Thirty minutes of face to face patient care time  were spent during this visit. All questions were encouraged and answered.

## 2014-11-29 NOTE — Patient Instructions (Signed)
PLEASE CALL ME WITH ANY PROBLEMS OR QUESTIONS (#[phone]).     WORK ON YOUR PROTEIN AND WEIGHT GAIN!!!!

## 2014-12-01 ENCOUNTER — Encounter (HOSPITAL_BASED_OUTPATIENT_CLINIC_OR_DEPARTMENT_OTHER): Payer: Self-pay | Admitting: Emergency Medicine

## 2014-12-01 ENCOUNTER — Telehealth: Payer: Self-pay | Admitting: Family Medicine

## 2014-12-01 ENCOUNTER — Emergency Department (HOSPITAL_BASED_OUTPATIENT_CLINIC_OR_DEPARTMENT_OTHER): Payer: Medicaid Other

## 2014-12-01 ENCOUNTER — Inpatient Hospital Stay (HOSPITAL_BASED_OUTPATIENT_CLINIC_OR_DEPARTMENT_OTHER)
Admission: EM | Admit: 2014-12-01 | Discharge: 2014-12-24 | DRG: 904 | Disposition: A | Discharge status: Home Health | Payer: Medicaid Other | Attending: Internal Medicine | Admitting: Internal Medicine

## 2014-12-01 DIAGNOSIS — G4700 Insomnia, unspecified: Secondary | ICD-10-CM | POA: Diagnosis present

## 2014-12-01 DIAGNOSIS — G9340 Encephalopathy, unspecified: Secondary | ICD-10-CM | POA: Diagnosis present

## 2014-12-01 DIAGNOSIS — G894 Chronic pain syndrome: Secondary | ICD-10-CM | POA: Diagnosis present

## 2014-12-01 DIAGNOSIS — L89322 Pressure ulcer of left buttock, stage 2: Secondary | ICD-10-CM | POA: Diagnosis present

## 2014-12-01 DIAGNOSIS — G8250 Quadriplegia, unspecified: Secondary | ICD-10-CM

## 2014-12-01 DIAGNOSIS — E1143 Type 2 diabetes mellitus with diabetic autonomic (poly)neuropathy: Secondary | ICD-10-CM | POA: Diagnosis present

## 2014-12-01 DIAGNOSIS — L89314 Pressure ulcer of right buttock, stage 4: Secondary | ICD-10-CM | POA: Diagnosis present

## 2014-12-01 DIAGNOSIS — L89154 Pressure ulcer of sacral region, stage 4: Secondary | ICD-10-CM | POA: Diagnosis present

## 2014-12-01 DIAGNOSIS — N319 Neuromuscular dysfunction of bladder, unspecified: Secondary | ICD-10-CM

## 2014-12-01 DIAGNOSIS — L8994 Pressure ulcer of unspecified site, stage 4: Secondary | ICD-10-CM | POA: Diagnosis not present

## 2014-12-01 DIAGNOSIS — J9601 Acute respiratory failure with hypoxia: Secondary | ICD-10-CM | POA: Diagnosis not present

## 2014-12-01 DIAGNOSIS — D72829 Elevated white blood cell count, unspecified: Secondary | ICD-10-CM | POA: Diagnosis present

## 2014-12-01 DIAGNOSIS — M869 Osteomyelitis, unspecified: Secondary | ICD-10-CM

## 2014-12-01 DIAGNOSIS — A419 Sepsis, unspecified organism: Secondary | ICD-10-CM | POA: Diagnosis not present

## 2014-12-01 DIAGNOSIS — E08621 Diabetes mellitus due to underlying condition with foot ulcer: Secondary | ICD-10-CM | POA: Diagnosis not present

## 2014-12-01 DIAGNOSIS — I9589 Other hypotension: Secondary | ICD-10-CM | POA: Diagnosis present

## 2014-12-01 DIAGNOSIS — E43 Unspecified severe protein-calorie malnutrition: Secondary | ICD-10-CM | POA: Diagnosis present

## 2014-12-01 DIAGNOSIS — B9689 Other specified bacterial agents as the cause of diseases classified elsewhere: Secondary | ICD-10-CM | POA: Diagnosis present

## 2014-12-01 DIAGNOSIS — R6520 Severe sepsis without septic shock: Secondary | ICD-10-CM | POA: Diagnosis not present

## 2014-12-01 DIAGNOSIS — R509 Fever, unspecified: Secondary | ICD-10-CM | POA: Diagnosis not present

## 2014-12-01 DIAGNOSIS — J690 Pneumonitis due to inhalation of food and vomit: Secondary | ICD-10-CM | POA: Diagnosis not present

## 2014-12-01 DIAGNOSIS — L89300 Pressure ulcer of unspecified buttock, unstageable: Secondary | ICD-10-CM | POA: Diagnosis not present

## 2014-12-01 DIAGNOSIS — E10621 Type 1 diabetes mellitus with foot ulcer: Secondary | ICD-10-CM | POA: Diagnosis present

## 2014-12-01 DIAGNOSIS — Z452 Encounter for adjustment and management of vascular access device: Secondary | ICD-10-CM

## 2014-12-01 DIAGNOSIS — J9600 Acute respiratory failure, unspecified whether with hypoxia or hypercapnia: Secondary | ICD-10-CM | POA: Diagnosis not present

## 2014-12-01 DIAGNOSIS — L8990 Pressure ulcer of unspecified site, unspecified stage: Secondary | ICD-10-CM | POA: Diagnosis not present

## 2014-12-01 DIAGNOSIS — G9511 Acute infarction of spinal cord (embolic) (nonembolic): Secondary | ICD-10-CM

## 2014-12-01 DIAGNOSIS — Z792 Long term (current) use of antibiotics: Secondary | ICD-10-CM

## 2014-12-01 DIAGNOSIS — M86159 Other acute osteomyelitis, unspecified femur: Secondary | ICD-10-CM | POA: Diagnosis not present

## 2014-12-01 DIAGNOSIS — Z87891 Personal history of nicotine dependence: Secondary | ICD-10-CM

## 2014-12-01 DIAGNOSIS — E1041 Type 1 diabetes mellitus with diabetic mononeuropathy: Secondary | ICD-10-CM | POA: Diagnosis not present

## 2014-12-01 DIAGNOSIS — K219 Gastro-esophageal reflux disease without esophagitis: Secondary | ICD-10-CM | POA: Diagnosis present

## 2014-12-01 DIAGNOSIS — A498 Other bacterial infections of unspecified site: Secondary | ICD-10-CM | POA: Diagnosis present

## 2014-12-01 DIAGNOSIS — T368X5A Adverse effect of other systemic antibiotics, initial encounter: Principal | ICD-10-CM | POA: Diagnosis present

## 2014-12-01 DIAGNOSIS — Z794 Long term (current) use of insulin: Secondary | ICD-10-CM | POA: Diagnosis not present

## 2014-12-01 DIAGNOSIS — R410 Disorientation, unspecified: Secondary | ICD-10-CM | POA: Diagnosis present

## 2014-12-01 DIAGNOSIS — L89623 Pressure ulcer of left heel, stage 3: Secondary | ICD-10-CM | POA: Diagnosis present

## 2014-12-01 DIAGNOSIS — M797 Fibromyalgia: Secondary | ICD-10-CM | POA: Diagnosis present

## 2014-12-01 DIAGNOSIS — Z8669 Personal history of other diseases of the nervous system and sense organs: Secondary | ICD-10-CM | POA: Diagnosis not present

## 2014-12-01 DIAGNOSIS — D649 Anemia, unspecified: Secondary | ICD-10-CM | POA: Diagnosis present

## 2014-12-01 DIAGNOSIS — IMO0002 Reserved for concepts with insufficient information to code with codable children: Secondary | ICD-10-CM | POA: Diagnosis present

## 2014-12-01 DIAGNOSIS — Z7401 Bed confinement status: Secondary | ICD-10-CM

## 2014-12-01 DIAGNOSIS — N390 Urinary tract infection, site not specified: Secondary | ICD-10-CM | POA: Diagnosis present

## 2014-12-01 DIAGNOSIS — D6489 Other specified anemias: Secondary | ICD-10-CM | POA: Diagnosis not present

## 2014-12-01 DIAGNOSIS — G8220 Paraplegia, unspecified: Secondary | ICD-10-CM | POA: Diagnosis not present

## 2014-12-01 DIAGNOSIS — M4628 Osteomyelitis of vertebra, sacral and sacrococcygeal region: Secondary | ICD-10-CM | POA: Diagnosis present

## 2014-12-01 DIAGNOSIS — E1165 Type 2 diabetes mellitus with hyperglycemia: Secondary | ICD-10-CM | POA: Diagnosis not present

## 2014-12-01 DIAGNOSIS — R532 Functional quadriplegia: Secondary | ICD-10-CM | POA: Diagnosis present

## 2014-12-01 DIAGNOSIS — K210 Gastro-esophageal reflux disease with esophagitis: Secondary | ICD-10-CM | POA: Diagnosis not present

## 2014-12-01 DIAGNOSIS — E876 Hypokalemia: Secondary | ICD-10-CM | POA: Diagnosis not present

## 2014-12-01 DIAGNOSIS — F419 Anxiety disorder, unspecified: Secondary | ICD-10-CM | POA: Diagnosis present

## 2014-12-01 DIAGNOSIS — J45909 Unspecified asthma, uncomplicated: Secondary | ICD-10-CM | POA: Diagnosis present

## 2014-12-01 DIAGNOSIS — Z936 Other artificial openings of urinary tract status: Secondary | ICD-10-CM | POA: Diagnosis not present

## 2014-12-01 DIAGNOSIS — Z9289 Personal history of other medical treatment: Secondary | ICD-10-CM

## 2014-12-01 DIAGNOSIS — E1043 Type 1 diabetes mellitus with diabetic autonomic (poly)neuropathy: Secondary | ICD-10-CM | POA: Diagnosis present

## 2014-12-01 DIAGNOSIS — R0603 Acute respiratory distress: Secondary | ICD-10-CM

## 2014-12-01 DIAGNOSIS — L89150 Pressure ulcer of sacral region, unstageable: Secondary | ICD-10-CM

## 2014-12-01 DIAGNOSIS — L89899 Pressure ulcer of other site, unspecified stage: Secondary | ICD-10-CM | POA: Diagnosis not present

## 2014-12-01 DIAGNOSIS — E1040 Type 1 diabetes mellitus with diabetic neuropathy, unspecified: Secondary | ICD-10-CM | POA: Diagnosis not present

## 2014-12-01 DIAGNOSIS — Z681 Body mass index (BMI) 19 or less, adult: Secondary | ICD-10-CM | POA: Diagnosis not present

## 2014-12-01 DIAGNOSIS — K3184 Gastroparesis: Secondary | ICD-10-CM | POA: Diagnosis present

## 2014-12-01 DIAGNOSIS — E1065 Type 1 diabetes mellitus with hyperglycemia: Secondary | ICD-10-CM | POA: Diagnosis present

## 2014-12-01 DIAGNOSIS — R000 Tachycardia, unspecified: Secondary | ICD-10-CM | POA: Diagnosis not present

## 2014-12-01 DIAGNOSIS — L97529 Non-pressure chronic ulcer of other part of left foot with unspecified severity: Secondary | ICD-10-CM

## 2014-12-01 DIAGNOSIS — L89159 Pressure ulcer of sacral region, unspecified stage: Secondary | ICD-10-CM | POA: Diagnosis present

## 2014-12-01 DIAGNOSIS — Z9359 Other cystostomy status: Secondary | ICD-10-CM

## 2014-12-01 DIAGNOSIS — J4520 Mild intermittent asthma, uncomplicated: Secondary | ICD-10-CM | POA: Diagnosis not present

## 2014-12-01 HISTORY — DX: Type 1 diabetes mellitus without complications: E10.9

## 2014-12-01 LAB — URINALYSIS, ROUTINE W REFLEX MICROSCOPIC
Bilirubin Urine: NEGATIVE
Glucose, UA: NEGATIVE mg/dL
KETONES UR: NEGATIVE mg/dL
Nitrite: NEGATIVE
PROTEIN: 30 mg/dL — AB
Specific Gravity, Urine: 1.016 (ref 1.005–1.030)
Urobilinogen, UA: 0.2 mg/dL (ref 0.0–1.0)
pH: 5.5 (ref 5.0–8.0)

## 2014-12-01 LAB — URINE MICROSCOPIC-ADD ON

## 2014-12-01 LAB — RAPID URINE DRUG SCREEN, HOSP PERFORMED
Amphetamines: NOT DETECTED
BARBITURATES: NOT DETECTED
Benzodiazepines: NOT DETECTED
COCAINE: NOT DETECTED
OPIATES: NOT DETECTED
TETRAHYDROCANNABINOL: NOT DETECTED

## 2014-12-01 LAB — COMPREHENSIVE METABOLIC PANEL
ALK PHOS: 128 U/L — AB (ref 39–117)
ALT: 19 U/L (ref 0–53)
AST: 25 U/L (ref 0–37)
Albumin: 2.7 g/dL — ABNORMAL LOW (ref 3.5–5.2)
Anion gap: 0 — ABNORMAL LOW (ref 5–15)
BUN: 9 mg/dL (ref 6–23)
CHLORIDE: 115 mmol/L — AB (ref 96–112)
CO2: 22 mmol/L (ref 19–32)
Calcium: 8.9 mg/dL (ref 8.4–10.5)
Creatinine, Ser: 0.71 mg/dL (ref 0.50–1.35)
GFR calc Af Amer: >90 mL/min (ref >90)
GFR calc non Af Amer: >90 mL/min (ref >90)
GLUCOSE: 126 mg/dL — AB (ref 70–99)
POTASSIUM: 4.6 mmol/L (ref 3.5–5.1)
Sodium: 134 mmol/L — ABNORMAL LOW (ref 135–145)
TOTAL PROTEIN: 7.8 g/dL (ref 6.0–8.3)
Total Bilirubin: 0.4 mg/dL (ref 0.3–1.2)

## 2014-12-01 LAB — AMMONIA: AMMONIA: 17 umol/L (ref 11–32)

## 2014-12-01 LAB — I-STAT ARTERIAL BLOOD GAS, ED
ACID-BASE DEFICIT: 6 mmol/L — AB (ref 0.0–2.0)
BICARBONATE: 18.9 meq/L — AB (ref 20.0–24.0)
O2 Saturation: 97 %
PO2 ART: 96 mmHg (ref 80.0–100.0)
TCO2: 20 mmol/L (ref 0–100)
pCO2 arterial: 34.8 mmHg — ABNORMAL LOW (ref 35.0–45.0)
pH, Arterial: 7.342 — ABNORMAL LOW (ref 7.350–7.450)

## 2014-12-01 LAB — CBC
HCT: 27.7 % — ABNORMAL LOW (ref 39.0–52.0)
Hemoglobin: 8.8 g/dL — ABNORMAL LOW (ref 13.0–17.0)
MCH: 26.8 pg (ref 26.0–34.0)
MCHC: 31.8 g/dL (ref 30.0–36.0)
MCV: 84.5 fL (ref 78.0–100.0)
PLATELETS: 640 10*3/uL — AB (ref 150–400)
RBC: 3.28 MIL/uL — AB (ref 4.22–5.81)
RDW: 16.9 % — AB (ref 11.5–15.5)
WBC: 13 10*3/uL — ABNORMAL HIGH (ref 4.0–10.5)

## 2014-12-01 LAB — GLUCOSE, CAPILLARY: Glucose-Capillary: 78 mg/dL (ref 70–99)

## 2014-12-01 LAB — ACETAMINOPHEN LEVEL: Acetaminophen (Tylenol), Serum: <10 ug/mL — ABNORMAL LOW (ref 10–30)

## 2014-12-01 LAB — SALICYLATE LEVEL: Salicylate Lvl: <4 mg/dL (ref 2.8–20.0)

## 2014-12-01 LAB — I-STAT CG4 LACTIC ACID, ED: LACTIC ACID, VENOUS: 1.06 mmol/L (ref 0.5–2.0)

## 2014-12-01 LAB — ETHANOL: Alcohol, Ethyl (B): <5 mg/dL (ref 0–9)

## 2014-12-01 MED ORDER — SODIUM CHLORIDE 0.9 % IJ SOLN
10.0000 mL | INTRAMUSCULAR | Status: DC | PRN
  Administered 2014-12-02 – 2014-12-24 (×8): 10 mL
  Filled 2014-12-01 (×8): qty 40

## 2014-12-01 MED ORDER — OXYCODONE-ACETAMINOPHEN 5-325 MG PO TABS
2.0000 | ORAL_TABLET | Freq: Once | ORAL | Status: AC
  Administered 2014-12-01: 2 via ORAL
  Filled 2014-12-01: qty 2

## 2014-12-01 NOTE — Telephone Encounter (Signed)
[facility] Night - Client TELEPHONE [facility] Patient Name: Jason [name] Gender: Male DOB: 10-Apr-1986 Age: 29 Y 34 M 23 D Return Phone Number: [phone] (Primary) Address: City/State/Zip: [place] [address] Client [facility] Night - Client Client Site [facility] - Night Physician [name] Contact Type Call Call Type Triage / Clinical Caller Name [place] [facility]rage Relationship To Patient Mother Return Phone Number [phone] (Primary) Chief Complaint CONFUSION - new onset Initial Comment Caller states son is hallucinating. confusion PreDisposition Home Care Nurse Assessment Nurse: Roosvelt Maser, RN, Barnetta Chapel Date/Time (Eastern Time): 11/30/2014 8:33:49 PM Confirm and document reason for call. If symptomatic, describe symptoms. ---mom states son has not been asleep since yesterday morning, is acting confused and hallucinating. xanax for anxiety , was released from hospital last week for sepsis (blood, urine, bone) is on two abx at home. no fever. mom is calling to see what she can do to help him sleep, thinks he is hallucinating because of sleep deprivation, already tried his PRN xanax and didnt help. Has the patient traveled out of the country within the last 30 days? ---Not Applicable Does the patient require triage? ---Yes Related visit to physician within the last 2 weeks? ---Yes Does the PT have any chronic conditions? (i.e. diabetes, asthma, etc.) ---Yes List chronic conditions. ---Quad, suprapubic cath, osteomy, DM Guidelines Guideline Title Affirmed Question Affirmed Notes Nurse Date/Time Eilene Ghazi Time) Confusion - Delirium [1] Difficult to awaken or acting confused (disoriented, slurred speech) AND [2] present now AND [3] diabetic Toya Smothers 11/30/2014 8:35:15 PM Disp. Time Eilene Ghazi Time) Disposition Final User 11/30/2014 8:32:20 PM Send to Urgent  Sharman Crate 11/30/2014 8:47:48 PM Call EMS 911 Now Yes Roosvelt Maser, RN, Barnetta Chapel PLEASE NOTE: All timestamps contained within this report are represented as [place] Standard Time. CONFIDENTIALTY NOTICE: This fax transmission is intended only for the addressee. It contains information that is legally privileged, confidential or otherwise protected from use or disclosure. If you are not the intended recipient, you are strictly prohibited from reviewing, disclosing, copying using or disseminating any of this information or taking any action in reliance on or regarding this information. If you have received this fax in error, please notify us immediately by telephone so that we can arrange for its return to [name]. Phone: [phone], Toll-Free: [phone], Fax: [phone] Page: 2 of 2 Call Id: [id number] Caller Understands: Yes Disagree/Comply: Disagree Disagree/Comply Reason: Wait and see Care Advice Given Per Guideline mom states sons blood sugars have been running fine, he is saying things that dont make sense but he can tell you where he is and who he is, if i call EMS he will jsut tell them he doesnt want to go and will be able to answer all of their questions. just want to know what i can do for him tonight to help him sleep? advised to call ems, states she will tyr to give him some benadryl and if he doesnt go to sleep she will call ems. CALL EMS 911 NOW: Immediate medical attention is needed. You need to hang up and call 911 (or an ambulance). ([facility] Discretion: I'll call you back in a few minutes to be sure you were able to reach them.) After Care Instructions Given Call Event Type User Date / Time Description

## 2014-12-01 NOTE — Telephone Encounter (Signed)
I spoke with mom and pt has not slept in 3 days, took Xanax yesterday morning and it did not help, pt is talking to people and seeing things that are not there. No fever, blood pressure normal.

## 2014-12-01 NOTE — Telephone Encounter (Signed)
noted 

## 2014-12-01 NOTE — Telephone Encounter (Signed)
Olin Hauser rn  from [facility] her cell. [phone]. Olin Hauser was at pt home when paramedic pick him upand they are taking [facility] to [facility]. Olin Hauser notice pt had a fine rash on forehead and loose stool and BS 415

## 2014-12-01 NOTE — ED Notes (Signed)
While in room with pt, pt talking about "hooks in eyes." Pt unable to explain this. Per mother, pt has been altered for the past 3 days after he got home from his PCP appointment.

## 2014-12-01 NOTE — ED Provider Notes (Signed)
CSN: [id number]     Arrival date & time 12/01/14  1443 History   First MD Initiated Contact with Patient 12/01/14 1451     Chief Complaint  Patient presents with  . Allergic Reaction     (Consider location/radiation/quality/duration/timing/severity/associated sxs/prior Treatment) The history is provided by the patient and medical records. No language interpreter was used.      Jason [name] is a 29 y.o. male  with a hx of asthma, MRSA infection, seizures, spinal stroke in 4/15, PNA presents to the Emergency Department from home with concerns for an allergic reaction after Invanz infusion.  Pt was admitted last week for sacral wound with osteomyelitis.  He has been taking Invanz and vancomycin since he was d/c home 1 week ago without adverse side effects.  Pt's mother reports that he has been hallucinating and not acting himself for 3 days.  Mother reports pt has not been sleeping at all either.  Pt was evaluated by home health today and noted a rash on his face prompting his transfer to the ED.  Mother reports the rash has been present for > 3 days, but she has not previously noticed it.  Family denies nausea, vomiting, diarrhea, swelling of the face or lips.  Pt reports back pain but denies CP, SOB or other associated symptoms. Nothing makes it better or worse.      Past Medical History  Diagnosis Date  . GERD (gastroesophageal reflux disease)   . Asthma   . Hx MRSA infection     on face  . Gastroparesis   . Diabetic neuropathy   . Seizures   . Stroke     spinal stroke in 4/15  . Diabetes mellitus     sees Dr. Loanne Drilling   . Family history of anesthesia complication     Pt mother can't have epidural procedures  . Dysrhythmia   . Pneumonia   . Arthritis   . Fibromyalgia    Past Surgical History  Procedure Laterality Date  . Tonsillectomy    . Multiple extractions with alveoloplasty N/A 08/03/2014    Procedure: MULTIPLE EXTRACTIONS;  Surgeon: Gae Bon, DDS;  Location:  [place];  Service: Oral Surgery;  Laterality: N/A;  . Tee without cardioversion N/A 08/17/2014    Procedure: TRANSESOPHAGEAL ECHOCARDIOGRAM (TEE);  Surgeon: Dorothy Spark, MD;  Location: [facility] ENDOSCOPY;  Service: Cardiovascular;  Laterality: N/A;  . Debridment of decubitus ulcer N/A 10/04/2014    Procedure: DEBRIDMENT OF DECUBITUS ULCER;  Surgeon: Georganna Skeans, MD;  Location: [place];  Service: General;  Laterality: N/A;  . Laparoscopic diverted colostomy N/A 10/12/2014    Procedure: LAPAROSCOPIC DIVERTING COLOSTOMY;  Surgeon: Donnie Mesa, MD;  Location: [place];  Service: General;  Laterality: N/A;  . Insertion of suprapubic catheter N/A 10/12/2014    Procedure: INSERTION OF SUPRAPUBIC CATHETER;  Surgeon: Reece Packer, MD;  Location: [place];  Service: Urology;  Laterality: N/A;  . Incision and drainage of wound N/A 11/12/2014    Procedure: IRRIGATION AND DEBRIDEMENT OF WOUNDS WITH BONE BIOPSY AND SURGICAL PREP ;  Surgeon: Theodoro Kos, DO;  Location: [place];  Service: Plastics;  Laterality: N/A;  . Application of a-cell of back N/A 11/12/2014    Procedure: APPLICATION A CELL AND VAC ;  Surgeon: Theodoro Kos, DO;  Location: [place];  Service: Plastics;  Laterality: N/A;  . Incision and drainage of wound N/A 11/18/2014    Procedure: IRRIGATION AND DEBRIDEMENT OF SACRAL ULCER ONLY WITH PLACEMENT OF  A CELL AND VAC/ DRESSING CHANGE TO UPPER BACK AREA.;  Surgeon: Theodoro Kos, DO;  Location: [place];  Service: Plastics;  Laterality: N/A;  . Incision and drainage of wound N/A 11/25/2014    Procedure: IRRIGATION AND DEBRIDEMENT OF SACRAL ULCER AND BACK BURN WITH PLACEMENT OF A-CELL;  Surgeon: Theodoro Kos, DO;  Location: [place];  Service: Plastics;  Laterality: N/A;  . Minor application of wound vac N/A 11/25/2014    Procedure:  WOUND VAC CHANGE;  Surgeon: Theodoro Kos, DO;  Location: [place];  Service: Plastics;  Laterality: N/A;   Family History  Problem Relation Age of Onset  . Diabetes Father   .  Hypertension Father   . Asthma      fhx  . Hypertension      fhx  . Stroke      fhx  . Heart disease Mother    History  Substance Use Topics  . Smoking status: Current Some Day Smoker    Types: Cigars, Cigarettes    Last Attempt to Quit: 01/29/2014  . Smokeless tobacco: Never Used     Comment: e-cigarette and cigars currently  . Alcohol Use: No     Comment: rarely    Review of Systems  Constitutional: Negative for fever, diaphoresis, appetite change, fatigue and unexpected weight change.  HENT: Negative for mouth sores.   Eyes: Negative for visual disturbance.  Respiratory: Negative for cough, chest tightness, shortness of breath and wheezing.   Cardiovascular: Negative for chest pain.  Gastrointestinal: Negative for nausea, vomiting, abdominal pain, diarrhea and constipation.  Endocrine: Negative for polydipsia, polyphagia and polyuria.  Genitourinary: Negative for dysuria, urgency, frequency and hematuria.  Musculoskeletal: Negative for back pain and neck stiffness.  Skin: Negative for rash.  Allergic/Immunologic: Negative for immunocompromised state.  Neurological: Negative for syncope, light-headedness and headaches.  Hematological: Does not bruise/bleed easily.  Psychiatric/Behavioral: Positive for confusion. Negative for sleep disturbance. The patient is not nervous/anxious.       Allergies  Cefuroxime axetil; Morphine and related; Penicillins; Sulfa antibiotics; Tessalon; and Shellfish allergy  Home Medications   Prior to Admission medications   Medication Sig Start Date End Date Taking? Authorizing Provider  albuterol (PROVENTIL) (2.5 MG/3ML) 0.083% nebulizer solution Take 3 mLs (2.5 mg total) by nebulization every 4 (four) hours as needed for wheezing or shortness of breath. 04/27/14   [name], MD  atorvastatin (LIPITOR) 20 MG tablet Take 20 mg by mouth daily.    Historical Provider, MD  baclofen (LIORESAL) 10 MG tablet Take 1-2 tablets (10-20 mg total)  by mouth 3 (three) times daily. Takes 10mg  in morning and lunchtime and takes 20mg  at bedtime 11/29/14   Meredith Staggers, MD  collagenase (SANTYL) ointment Apply 1 application topically daily. 08/30/14   [name], MD  dexlansoprazole (DEXILANT) 60 MG capsule Take 1 capsule (60 mg total) by mouth daily. 11/19/14   Shanker Kristeen Mans, MD  diclofenac sodium (VOLTAREN) 1 % GEL Apply 2 g topically daily as needed (for pain). 11/19/14   Shanker Kristeen Mans, MD  ertapenem 1 g in sodium chloride 0.9 % 50 mL Inject 1 g into the vein daily. For 6 weeks from 11/12/14 11/19/14   Jonetta Osgood, MD  feeding supplement, GLUCERNA SHAKE, (GLUCERNA SHAKE) LIQD Take 237 mLs by mouth 3 (three) times daily between meals. Patient taking differently: Take 237 mLs by mouth 2 (two) times daily as needed (feeding supplement).  11/19/14   Shanker Kristeen Mans, MD  fentaNYL (  DURAGESIC - DOSED MCG/HR) 25 MCG/HR patch Place 1 patch (25 mcg total) onto the skin every 3 (three) days. 11/29/14   Meredith Staggers, MD  glucagon (GLUCAGON EMERGENCY) 1 MG injection Inject 1 mg into the vein once as needed. 04/20/14   [name], MD  insulin aspart (NOVOLOG) 100 UNIT/ML injection Inject 4 Units into the skin 3 (three) times daily before meals. Patient taking differently: Inject 5 Units into the skin 4 (four) times daily.  11/19/14   Shanker Kristeen Mans, MD  insulin glargine (LANTUS) 100 UNIT/ML injection Inject 0.25 mLs (25 Units total) into the skin at bedtime. 11/19/14   Shanker Kristeen Mans, MD  loratadine (CLARITIN) 10 MG tablet Take 10 mg by mouth daily.     Historical Provider, MD  ondansetron (ZOFRAN) 4 MG tablet Take 4 mg by mouth every 8 (eight) hours as needed for nausea or vomiting.    Historical Provider, MD  oxyCODONE-acetaminophen (PERCOCET) 10-325 MG per tablet Take 1 tablet by mouth every 8 (eight) hours. 11/29/14   Meredith Staggers, MD  potassium chloride SA (KLOR-CON M20) 20 MEQ tablet Take 1 tablet (20 mEq total) by mouth daily.  10/25/14   [name], MD  pregabalin (LYRICA) 100 MG capsule Take 1-2 capsules (100-200 mg total) by mouth 4 (four) times daily. Take 100 mg by mouth in the morning, take 100 mg by mouth in the afternoon, take 100 mg by mouth in the evening and take 200 mg by mouth at bedtime. 11/29/14   Meredith Staggers, MD  sodium chloride (OCEAN) 0.65 % SOLN nasal spray Place 1 spray into both nostrils as needed for congestion.    Historical Provider, MD  vancomycin (VANCOCIN) 1 GM/200ML SOLN Inject 200 mLs (1,000 mg total) into the vein daily. 11/19/14   Shanker Kristeen Mans, MD   BP 127/94 mmHg  Pulse 92  Temp(Src) 98.5 F (36.9 C) (Oral)  Resp 16  SpO2 99% Physical Exam  Constitutional: He appears well-developed and well-nourished. No distress.  Awake, alert, nontoxic appearance  HENT:  Head: Normocephalic and atraumatic.  Nose: Nose normal.  Mouth/Throat: Oropharynx is clear and moist. No oropharyngeal exudate.  Eyes: Conjunctivae and EOM are normal. Pupils are equal, round, and reactive to light. No scleral icterus.  Neck: Normal range of motion. Neck supple.  Full range of motion without pain No nuchal rigidity or meningeal signs  Cardiovascular: Normal rate, regular rhythm, normal heart sounds and intact distal pulses.   No murmur heard. No tachycardia  Pulmonary/Chest: Effort normal and breath sounds normal. No respiratory distress. He has no wheezes.  Equal chest expansion Clear and equal breath sounds  Abdominal: Soft. Bowel sounds are normal. He exhibits no mass. There is no tenderness. There is no rebound and no guarding.  Soft and nontender No erythema around the colostomy site  Musculoskeletal: Normal range of motion. He exhibits no edema.  Neurological: He is alert.  Patient's speech is clear. He is able to orient 3 without difficulty and carries on adequate conversation however when he has not fully engaged in conversation he is hallucinating, talking to people in the room and  discussing things that are not there.  Skin: Skin is warm and dry. He is not diaphoretic.  Ulceration to the dorsum of the left foot and heel of the left foot without induration or evidence of infection Large decubitus ulcer with wound VAC in place. No surrounding erythema or induration Large wound to the spine covered with dressing  without surrounding erythema or increased warmth  Psychiatric: He has a normal mood and affect.  Nursing note and vitals reviewed.   ED Course  Procedures (including critical care time) Labs Review Labs Reviewed  CBC - Abnormal; Notable for the following:    WBC 13.0 (*)    RBC 3.28 (*)    Hemoglobin 8.8 (*)    HCT 27.7 (*)    RDW 16.9 (*)    Platelets 640 (*)    All other components within normal limits  COMPREHENSIVE METABOLIC PANEL - Abnormal; Notable for the following:    Sodium 134 (*)    Chloride 115 (*)    Glucose, Bld 126 (*)    Albumin 2.7 (*)    Alkaline Phosphatase 128 (*)    Anion gap 0.0 (*)    All other components within normal limits  URINALYSIS, ROUTINE W REFLEX MICROSCOPIC - Abnormal; Notable for the following:    APPearance CLOUDY (*)    Hgb urine dipstick SMALL (*)    Protein, ur 30 (*)    Leukocytes, UA MODERATE (*)    All other components within normal limits  URINE MICROSCOPIC-ADD ON - Abnormal; Notable for the following:    Bacteria, UA FEW (*)    All other components within normal limits  ACETAMINOPHEN LEVEL - Abnormal; Notable for the following:    Acetaminophen (Tylenol), Serum <10.0 (*)    All other components within normal limits  I-STAT ARTERIAL BLOOD GAS, ED - Abnormal; Notable for the following:    pH, Arterial 7.342 (*)    pCO2 arterial 34.8 (*)    Bicarbonate 18.9 (*)    Acid-base deficit 6.0 (*)    All other components within normal limits  CULTURE, BLOOD (ROUTINE X 2)  SALICYLATE LEVEL  ETHANOL  URINE RAPID DRUG SCREEN (HOSP PERFORMED)  AMMONIA  I-STAT CG4 LACTIC ACID, ED    Imaging Review Ct Head  Wo Contrast  12/01/2014   CLINICAL DATA:  Allergic reaction to pain meds withdrawal. Pt has had hallucinations and altered mental status x 3 days per mom  EXAM: CT HEAD WITHOUT CONTRAST  TECHNIQUE: Contiguous axial images were obtained from the base of the skull through the vertex without intravenous contrast.  COMPARISON:  Head CT 07/29/2014, MRI 11 07/2014  FINDINGS: No acute intracranial hemorrhage. No focal mass lesion. No CT evidence of acute infarction. No midline shift or mass effect. No hydrocephalus. Basilar cisterns are patent. Paranasal sinuses and mastoid air cells are clear.  IMPRESSION: History no acute   Electronically Signed   By: Suzy Bouchard M.D.   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  Patient being treated at home with IV antibiotics for osteomyelitis. Wound VAC in place. No evidence of extending infection around patient's wound sites. Patient alert and oriented 3 however actively hallucinating. We'll begin workup.  6PM Blood gas with pH of 7.3, lactic acid within normal limits, urinalysis without evidence of urinary tract infection.  Leukocyte ptosis of 13.0 however this is trending down from patient's previous hospital stay earlier this month with leukocytosis of 28. This is compared to patient's last  blood work on 11/25/2014 with a leukocytosis of 14.6.   7PM CT head without acute abnormality.  No source of patient's confusion. Patient is supposed to be taking fentanyl patches and Percocet. Mother reports holding Percocet for the last 2 days secondary to altered mental status. She reports  that he has a fentanyl patch on him however I have been unable to locate this on physical exam. Patient's urine drug screen is without opiates.  Patient will need admission.   7:18 PM Discussed with Dr. Hal Hope who will admit to Tele.  The patient was discussed with and seen by Dr. Wyvonnia Dusky who agrees with the treatment plan.   Jason Soho Kahliya Fraleigh, PA-C 12/01/14 1918  Ezequiel Essex, MD 12/02/14 [phone]

## 2014-12-01 NOTE — Telephone Encounter (Signed)
I see they did not take him to the ED last night as advised. Please call his mother and find out how he is doing today

## 2014-12-01 NOTE — ED Notes (Signed)
Pt comes in today with EMS for a c/o allergic reaction. Home health nurse administered Invanz 1 gram around 1130 today and over time the nurse noticed redness and hives to his face. Pt did have some confusion as well. EMS gave 50mg  benadryl and 50mg  of Zantac.

## 2014-12-01 NOTE — H&P (Signed)
[facility] History and Physical  Jason [name] ZOX:[id number] DOB: 11/27/1985 DOA: 12/01/2014  Referring physician: ED physician PCP: [name], MD  Specialists:   Chief Complaint: Hallucination and altered mental status  HPI: Jason [name] is a 29 y.o. 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  Patient was recently hospitalized from 2/1 to 2/12. He was treated for ischial osteomyelitis, pneumonia, Klebsiella bacteremia, and Burkhoderia UTI in hospital. Patient was discharged on IV vancomycin plus ertapenem, which are supposed to be continued for total of 6 weeks per ID. Patient has been doing okay until 3 days ago when he started hallucinating. It seems that he is on the phone talking to somebody who is not there. Mother reports pt has not been sleeping at all in the past 3 days. Mother states that patient had hallucination 3 months ago, which was likely caused by morphine, since his symptoms resolved after discontinuation of morphine. This time, mother discontinued patient's Percocet, Lyrica and baclofen without any help. His mother states that patient developed new rashes over his forehead and upper face. He has back pain. No headache, neck rigidity or photophobia. Patient denies fever, chills, cough, chest pain, SOB, nausea, vomiting, abdominal pain, diarrhea, dysuria, urgency, frequency, hematuria or leg swelling.   In ED, patient was found to have negative CT head for acute abnormalities. Leukocytosis with WBC 13.0, negative UDS, lactate 1.06, positive urinalysis for moderate leukocytes, negative salicylate and acetaminophen level, ammonia 17, temperature normal. Electrolytes okay. Patient's admitted to inpatient for further evaluation and treatment.  Review of Systems: As  presented in the history of presenting illness, rest negative.  Where does patient live?  At home with parents Can patient participate in ADLs? none  Allergy:  Allergies  Allergen Reactions  . Cefuroxime Axetil Anaphylaxis  . Morphine And Related Other (See Comments)    Changed mental status, confusion, headache, visual hallucination  . Penicillins Anaphylaxis and Other (See Comments)    ?can take amoxicillin?  . Sulfa Antibiotics Anaphylaxis, Shortness Of Breath and Other (See Comments)  . Tessalon [Benzonatate] Anaphylaxis  . Shellfish Allergy Itching and Other (See Comments)    Took benadryl to alleviate reaction    Past Medical History  Diagnosis Date  . GERD (gastroesophageal reflux disease)   . Asthma   . Hx MRSA infection     on face  . Gastroparesis   . Diabetic neuropathy   . Seizures   . Stroke     spinal stroke in 4/15  . Diabetes mellitus     sees Dr. Loanne Drilling   . Family history of anesthesia complication     Pt mother can't have epidural procedures  . Dysrhythmia   . Pneumonia   . Arthritis   . Fibromyalgia     Past Surgical History  Procedure Laterality Date  . Tonsillectomy    . Multiple extractions with alveoloplasty N/A 08/03/2014    Procedure: MULTIPLE EXTRACTIONS;  Surgeon: Gae Bon, DDS;  Location: [place];  Service: Oral Surgery;  Laterality: N/A;  . Tee without cardioversion N/A 08/17/2014    Procedure: TRANSESOPHAGEAL ECHOCARDIOGRAM (TEE);  Surgeon: Dorothy Spark, MD;  Location: [facility] ENDOSCOPY;  Service: Cardiovascular;  Laterality: N/A;  . Debridment of decubitus ulcer N/A 10/04/2014    Procedure: DEBRIDMENT OF DECUBITUS ULCER;  Surgeon: Georganna Skeans, MD;  Location: [facility]  OR;  Service: General;  Laterality: N/A;  . Laparoscopic diverted colostomy N/A 10/12/2014    Procedure: LAPAROSCOPIC DIVERTING COLOSTOMY;  Surgeon: Donnie Mesa, MD;  Location: [place];  Service: General;  Laterality: N/A;  . Insertion of suprapubic catheter N/A 10/12/2014     Procedure: INSERTION OF SUPRAPUBIC CATHETER;  Surgeon: Reece Packer, MD;  Location: [place];  Service: Urology;  Laterality: N/A;  . Incision and drainage of wound N/A 11/12/2014    Procedure: IRRIGATION AND DEBRIDEMENT OF WOUNDS WITH BONE BIOPSY AND SURGICAL PREP ;  Surgeon: Theodoro Kos, DO;  Location: [place];  Service: Plastics;  Laterality: N/A;  . Application of a-cell of back N/A 11/12/2014    Procedure: APPLICATION A CELL AND VAC ;  Surgeon: Theodoro Kos, DO;  Location: [place];  Service: Plastics;  Laterality: N/A;  . Incision and drainage of wound N/A 11/18/2014    Procedure: IRRIGATION AND DEBRIDEMENT OF SACRAL ULCER ONLY WITH PLACEMENT OF A CELL AND VAC/ DRESSING CHANGE TO UPPER BACK AREA.;  Surgeon: Theodoro Kos, DO;  Location: [place];  Service: Plastics;  Laterality: N/A;  . Incision and drainage of wound N/A 11/25/2014    Procedure: IRRIGATION AND DEBRIDEMENT OF SACRAL ULCER AND BACK BURN WITH PLACEMENT OF A-CELL;  Surgeon: Theodoro Kos, DO;  Location: [place];  Service: Plastics;  Laterality: N/A;  . Minor application of wound vac N/A 11/25/2014    Procedure:  WOUND VAC CHANGE;  Surgeon: Theodoro Kos, DO;  Location: [place];  Service: Plastics;  Laterality: N/A;    Social History:  reports that he has been smoking Cigars and Cigarettes.  He has never used smokeless tobacco. He reports that he does not drink alcohol or use illicit drugs.  Family History:  Family History  Problem Relation Age of Onset  . Diabetes Father   . Hypertension Father   . Asthma      fhx  . Hypertension      fhx  . Stroke      fhx  . Heart disease Mother      Prior to Admission medications   Medication Sig Start Date End Date Taking? Authorizing Provider  albuterol (PROVENTIL) (2.5 MG/3ML) 0.083% nebulizer solution Take 3 mLs (2.5 mg total) by nebulization every 4 (four) hours as needed for wheezing or shortness of breath. 04/27/14   [name], MD  atorvastatin (LIPITOR) 20 MG tablet Take 20 mg  by mouth daily.    Historical Provider, MD  baclofen (LIORESAL) 10 MG tablet Take 1-2 tablets (10-20 mg total) by mouth 3 (three) times daily. Takes 10mg  in morning and lunchtime and takes 20mg  at bedtime 11/29/14   Meredith Staggers, MD  collagenase (SANTYL) ointment Apply 1 application topically daily. 08/30/14   [name], MD  dexlansoprazole (DEXILANT) 60 MG capsule Take 1 capsule (60 mg total) by mouth daily. 11/19/14   Shanker Kristeen Mans, MD  diclofenac sodium (VOLTAREN) 1 % GEL Apply 2 g topically daily as needed (for pain). 11/19/14   Shanker Kristeen Mans, MD  ertapenem 1 g in sodium chloride 0.9 % 50 mL Inject 1 g into the vein daily. For 6 weeks from 11/12/14 11/19/14   Jonetta Osgood, MD  feeding supplement, GLUCERNA SHAKE, (GLUCERNA SHAKE) LIQD Take 237 mLs by mouth 3 (three) times daily between meals. Patient taking differently: Take 237 mLs by mouth 2 (two) times daily as needed (feeding supplement).  11/19/14   Shanker Kristeen Mans, MD  fentaNYL ([place] -  DOSED MCG/HR) 25 MCG/HR patch Place 1 patch (25 mcg total) onto the skin every 3 (three) days. 11/29/14   Meredith Staggers, MD  glucagon (GLUCAGON EMERGENCY) 1 MG injection Inject 1 mg into the vein once as needed. 04/20/14   [name], MD  insulin aspart (NOVOLOG) 100 UNIT/ML injection Inject 4 Units into the skin 3 (three) times daily before meals. Patient taking differently: Inject 5 Units into the skin 4 (four) times daily.  11/19/14   Shanker Kristeen Mans, MD  insulin glargine (LANTUS) 100 UNIT/ML injection Inject 0.25 mLs (25 Units total) into the skin at bedtime. 11/19/14   Shanker Kristeen Mans, MD  loratadine (CLARITIN) 10 MG tablet Take 10 mg by mouth daily.     Historical Provider, MD  ondansetron (ZOFRAN) 4 MG tablet Take 4 mg by mouth every 8 (eight) hours as needed for nausea or vomiting.    Historical Provider, MD  oxyCODONE-acetaminophen (PERCOCET) 10-325 MG per tablet Take 1 tablet by mouth every 8 (eight) hours. 11/29/14   Meredith Staggers, MD  potassium chloride SA (KLOR-CON M20) 20 MEQ tablet Take 1 tablet (20 mEq total) by mouth daily. 10/25/14   [name], MD  pregabalin (LYRICA) 100 MG capsule Take 1-2 capsules (100-200 mg total) by mouth 4 (four) times daily. Take 100 mg by mouth in the morning, take 100 mg by mouth in the afternoon, take 100 mg by mouth in the evening and take 200 mg by mouth at bedtime. 11/29/14   Meredith Staggers, MD  sodium chloride (OCEAN) 0.65 % SOLN nasal spray Place 1 spray into both nostrils as needed for congestion.    Historical Provider, MD  vancomycin (VANCOCIN) 1 GM/200ML SOLN Inject 200 mLs (1,000 mg total) into the vein daily. 11/19/14   Jonetta Osgood, MD    Physical Exam: Filed Vitals:   12/01/14 2024 12/01/14 2032 12/01/14 2040 12/01/14 2146  BP: 120/94   130/86  Pulse:  91  90  Temp:   98.4 F (36.9 C) 98.1 F (36.7 C)  TempSrc:    Oral  Resp:      SpO2:  100%  100%   General: Not in acute distress. Dry mucous and membrane HEENT:       Eyes: PERRL, EOMI, no scleral icterus       ENT: No discharge from the ears and nose, no pharynx injection, no tonsillar enlargement.        Neck: No JVD, no bruit, no mass felt. Cardiac: S1/S2, RRR, No murmurs, No gallops or rubs Pulm: Good air movement bilaterally. Clear to auscultation bilaterally. No rales, wheezing, rhonchi or rubs. Abd: Soft, nondistended, nontender, no rebound pain, no organomegaly, BS present Ext: No edema bilaterally. 2+DP/PT pulse bilaterally Musculoskeletal: No joint deformities, erythema, or stiffness, ROM full Skin: Ulceration to the dorsum of the left foot and heel of the left foot without induration or evidence of infection, large decubitus ulcer with wound VAC in place. No surrounding erythema or induration. Large wound to the spine covered with dressing without surrounding erythema or increased warmth There are some small black colored rashes over his forehead and upper face. Neuro: Alert and oriented  X3, cranial nerves II-XII grossly intact, follows commands and answers questions, however when he is hallucinating, talking to people in the room and discussing things that are not there. Could not mover legs, but moves arms.  Psych: Patient is not psychotic, no suicidal or hemocidal ideation.  Labs on Admission:  Basic Metabolic Panel:  Recent Labs Lab 11/25/14 1038 11/25/14 1945 12/01/14 1605  NA 139 139 134*  K 5.3* 4.9 4.6  CL 114* 116* 115*  CO2 22 20 22   GLUCOSE 171* 75 126*  BUN 8 5* 9  CREATININE 0.53 0.49* 0.71  CALCIUM 9.9 9.0 8.9   Liver Function Tests:  Recent Labs Lab 12/01/14 1605  AST 25  ALT 19  ALKPHOS 128*  BILITOT 0.4  PROT 7.8  ALBUMIN 2.7*   No results for input(s): LIPASE, AMYLASE in the last 168 hours.  Recent Labs Lab 12/01/14 2008  AMMONIA 17   CBC:  Recent Labs Lab 11/25/14 1038 12/01/14 1605  WBC 14.2* 13.0*  HGB 7.7* 8.8*  HCT 24.4* 27.7*  MCV 84.4 84.5  PLT 666* 640*   Cardiac Enzymes: No results for input(s): CKTOTAL, CKMB, CKMBINDEX, TROPONINI in the last 168 hours.  BNP (last 3 results) No results for input(s): BNP in the last 8760 hours.  ProBNP (last 3 results)  Recent Labs  01/29/14 2113  PROBNP 1343.0*    CBG:  Recent Labs Lab 11/25/14 1000 11/25/14 1235 11/25/14 1434 11/25/14 1611 11/25/14 2006  GLUCAP 184* 125* 100* 72 81    Radiological Exams on Admission: Ct Head Wo Contrast  12/01/2014   CLINICAL DATA:  Allergic reaction to pain meds withdrawal. Pt has had hallucinations and altered mental status x 3 days per mom  EXAM: CT HEAD WITHOUT CONTRAST  TECHNIQUE: Contiguous axial images were obtained from the base of the skull through the vertex without intravenous contrast.  COMPARISON:  Head CT 07/29/2014, MRI 11 07/2014  FINDINGS: No acute intracranial hemorrhage. No focal mass lesion. No CT evidence of acute infarction. No midline shift or mass effect. No hydrocephalus. Basilar cisterns are patent.  Paranasal sinuses and mastoid air cells are clear.  IMPRESSION: History no acute   Electronically Signed   By: Suzy Bouchard M.D.   On: 12/01/2014 18:32    EKG: will get one   Assessment/Plan Principal Problem:   Acute encephalopathy Active Problems:   Type 1 diabetes, uncontrolled, with neuropathy   Asthma   GERD   Gastroparesis due to DM   Protein-calorie malnutrition, severe   Spinal cord infarction (history of)   Tetraplegia   Neurogenic bladder   UTI (urinary tract infection)   Sacral pressure sore   Stage IV decubitus ulcer   Osteomyelitis, pelvis   Anemia   Burkholderia cepacia infection   Disorientation  Acute encephalopathy: Patient has hallucinations and altered mental status. Etiology is not clear. He has complicated infection, including pneumonia, ischial osteomyelitis, Klebsiella bacteremia and Burkhoderia UTI, but infection seems to be controlled well by currently IV vancomycin and ertapenem. He has mild leukocytosis with WBC 13.0, but he has normal lactate and no fever. CT head is negative for acute abnormalities. Patient does not have signs of meningitis. A potential differential diagnosis is side effects of ertapenem. According to UpToDate, ertapenem can cause altered mental status in 3-5% of the people. His new skin rashes could also be due to ertapenem side effects.   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  It also showed multiple fluid collections within the abductor magnus and abductor brevis muscles likely consistent with  intramuscular abscesses. Seen by plastic surgery, underwent debridement on 11/12/14, 11/18/14 and 11/25/14 with VAC placement. Infectious disease was consulted. per Dr. Lucianne Lei dam, pt needs vancomycin and ertapenem for 6 weeks from date of last debridement-11/12/14. -Switch ertapenem to meropenem and continue IV vanco as above -Follow-up for blood culture, urine culture -consult to wound care team  Klebsiella pneumoniae bacteremia: Blood culture on 11/08/14 positive for Klebsiella pneumoniae. Patient does not have cough or chest pain on this admission. -On meropenem and continue IV vanco  Burkholderia UTI: Patient is asymptomatic, still has moderate leukocytes on urinalysis.  -On meropenem and continue IV vanco -follow Ux  Type 1 diabetes: A1c 7.9 on 11/17/14. Patient is on Lantus at home. -Decrease the dose of Lantus from 25 units to 18 units due to decreased oral intake -SSI.  Functional quadriplegia with neurogenic bladder: Bedbound, Secondary to spinal cord infarction.  -suprapubic catheter in place.  Severe Protein calorie malnutrition:  -Continue with supplements  History of gastroparesis: Currently without any vomiting  GERD:  Continue PPI   DVT ppx: SQ Heparin        Code Status: Full code Family Communication:   Yes, patient's mother  at bed side Disposition Plan: Admit to inpatient   Date of Service 12/01/2014    Ivor Costa [facility] Pager [phone]  If 7PM-7AM, please contact night-coverage [email or web address] Password [facility] 12/01/2014, 11:43 PM

## 2014-12-02 ENCOUNTER — Encounter (HOSPITAL_COMMUNITY): Payer: Self-pay | Admitting: General Practice

## 2014-12-02 ENCOUNTER — Inpatient Hospital Stay (HOSPITAL_COMMUNITY): Payer: Medicaid Other

## 2014-12-02 DIAGNOSIS — N319 Neuromuscular dysfunction of bladder, unspecified: Secondary | ICD-10-CM

## 2014-12-02 LAB — COMPREHENSIVE METABOLIC PANEL
ALT: 18 U/L (ref 0–53)
ANION GAP: 4 — AB (ref 5–15)
AST: 23 U/L (ref 0–37)
Albumin: 2.4 g/dL — ABNORMAL LOW (ref 3.5–5.2)
Alkaline Phosphatase: 122 U/L — ABNORMAL HIGH (ref 39–117)
BUN: 6 mg/dL (ref 6–23)
CO2: 24 mmol/L (ref 19–32)
Calcium: 9.6 mg/dL (ref 8.4–10.5)
Chloride: 109 mmol/L (ref 96–112)
Creatinine, Ser: 0.62 mg/dL (ref 0.50–1.35)
GFR calc non Af Amer: >90 mL/min (ref >90)
Glucose, Bld: 158 mg/dL — ABNORMAL HIGH (ref 70–99)
Potassium: 4.8 mmol/L (ref 3.5–5.1)
SODIUM: 137 mmol/L (ref 135–145)
TOTAL PROTEIN: 7.6 g/dL (ref 6.0–8.3)
Total Bilirubin: 0.6 mg/dL (ref 0.3–1.2)

## 2014-12-02 LAB — GLUCOSE, CAPILLARY
GLUCOSE-CAPILLARY: 114 mg/dL — AB (ref 70–99)
GLUCOSE-CAPILLARY: 204 mg/dL — AB (ref 70–99)
Glucose-Capillary: 202 mg/dL — ABNORMAL HIGH (ref 70–99)
Glucose-Capillary: 192 mg/dL — ABNORMAL HIGH (ref 70–99)
Glucose-Capillary: 243 mg/dL — ABNORMAL HIGH (ref 70–99)

## 2014-12-02 LAB — CBC
HCT: 26.9 % — ABNORMAL LOW (ref 39.0–52.0)
HEMOGLOBIN: 8.6 g/dL — AB (ref 13.0–17.0)
MCH: 26.2 pg (ref 26.0–34.0)
MCHC: 32 g/dL (ref 30.0–36.0)
MCV: 82 fL (ref 78.0–100.0)
PLATELETS: 621 10*3/uL — AB (ref 150–400)
RBC: 3.28 MIL/uL — AB (ref 4.22–5.81)
RDW: 17.2 % — ABNORMAL HIGH (ref 11.5–15.5)
WBC: 13.6 10*3/uL — ABNORMAL HIGH (ref 4.0–10.5)

## 2014-12-02 LAB — TSH: TSH: 8.342 u[IU]/mL — ABNORMAL HIGH (ref 0.350–4.500)

## 2014-12-02 LAB — MAGNESIUM: MAGNESIUM: 1.2 mg/dL — AB (ref 1.5–2.5)

## 2014-12-02 LAB — PROTIME-INR
INR: 1.1 (ref 0.00–1.49)
Prothrombin Time: 14.3 seconds (ref 11.6–15.2)

## 2014-12-02 LAB — VANCOMYCIN, TROUGH: VANCOMYCIN TR: 11.3 ug/mL (ref 10.0–20.0)

## 2014-12-02 MED ORDER — GLUCERNA SHAKE PO LIQD
237.0000 mL | Freq: Two times a day (BID) | ORAL | Status: DC | PRN
  Administered 2014-12-02 (×2): 237 mL via ORAL
  Filled 2014-12-02 (×2): qty 237

## 2014-12-02 MED ORDER — SALINE SPRAY 0.65 % NA SOLN: 1.0000 | NASAL | Status: DC | PRN

## 2014-12-02 MED ORDER — SODIUM CHLORIDE 0.9 % IJ SOLN
3.0000 mL | Freq: Two times a day (BID) | INTRAMUSCULAR | Status: DC
  Administered 2014-12-02 – 2014-12-22 (×19): 3 mL via INTRAVENOUS

## 2014-12-02 MED ORDER — SODIUM CHLORIDE 0.9 % IV BOLUS (SEPSIS)
1000.0000 mL | Freq: Once | INTRAVENOUS | Status: AC
  Administered 2014-12-02: 1000 mL via INTRAVENOUS

## 2014-12-02 MED ORDER — ALBUTEROL SULFATE (2.5 MG/3ML) 0.083% IN NEBU: 2.5000 mg | INHALATION_SOLUTION | RESPIRATORY_TRACT | Status: DC | PRN

## 2014-12-02 MED ORDER — INSULIN ASPART 100 UNIT/ML ~~LOC~~ SOLN
0.0000 [IU] | Freq: Three times a day (TID) | SUBCUTANEOUS | Status: DC
Start: 2014-12-02 — End: 2014-12-14
  Administered 2014-12-02: 3 [IU] via SUBCUTANEOUS
  Administered 2014-12-02: 2 [IU] via SUBCUTANEOUS
  Administered 2014-12-02: 3 [IU] via SUBCUTANEOUS
  Administered 2014-12-03: 1 [IU] via SUBCUTANEOUS
  Administered 2014-12-03: 5 [IU] via SUBCUTANEOUS
  Administered 2014-12-04: 2 [IU] via SUBCUTANEOUS
  Administered 2014-12-04: 7 [IU] via SUBCUTANEOUS
  Administered 2014-12-04: 9 [IU] via SUBCUTANEOUS
  Administered 2014-12-05: 7 [IU] via SUBCUTANEOUS
  Administered 2014-12-05: 3 [IU] via SUBCUTANEOUS
  Administered 2014-12-05: 7 [IU] via SUBCUTANEOUS
  Administered 2014-12-06 (×2): 3 [IU] via SUBCUTANEOUS
  Administered 2014-12-06: 1 [IU] via SUBCUTANEOUS
  Administered 2014-12-07: 2 [IU] via SUBCUTANEOUS
  Administered 2014-12-07: 5 [IU] via SUBCUTANEOUS
  Administered 2014-12-07: 2 [IU] via SUBCUTANEOUS
  Administered 2014-12-09: 1 [IU] via SUBCUTANEOUS
  Administered 2014-12-09 (×2): 2 [IU] via SUBCUTANEOUS
  Administered 2014-12-10: 1 [IU] via SUBCUTANEOUS
  Administered 2014-12-10: 2 [IU] via SUBCUTANEOUS
  Administered 2014-12-11 – 2014-12-12 (×2): 3 [IU] via SUBCUTANEOUS
  Administered 2014-12-12: 5 [IU] via SUBCUTANEOUS
  Administered 2014-12-12: 2 [IU] via SUBCUTANEOUS
  Administered 2014-12-13 (×3): 9 [IU] via SUBCUTANEOUS
  Administered 2014-12-14: 5 [IU] via SUBCUTANEOUS
  Administered 2014-12-14: 2 [IU] via SUBCUTANEOUS

## 2014-12-02 MED ORDER — LORAZEPAM 2 MG/ML IJ SOLN
2.0000 mg | Freq: Four times a day (QID) | INTRAMUSCULAR | Status: DC | PRN
Start: 2014-12-02 — End: 2014-12-06
  Administered 2014-12-02 – 2014-12-06 (×7): 2 mg via INTRAVENOUS
  Filled 2014-12-02 (×7): qty 1

## 2014-12-02 MED ORDER — PANTOPRAZOLE SODIUM 40 MG PO TBEC
40.0000 mg | DELAYED_RELEASE_TABLET | Freq: Every day | ORAL | Status: DC
  Administered 2014-12-02 – 2014-12-15 (×12): 40 mg via ORAL
  Filled 2014-12-02 (×14): qty 1

## 2014-12-02 MED ORDER — GLUCERNA SHAKE PO LIQD
237.0000 mL | Freq: Three times a day (TID) | ORAL | Status: DC
  Administered 2014-12-02 – 2014-12-13 (×26): 237 mL via ORAL

## 2014-12-02 MED ORDER — DICLOFENAC SODIUM 1 % TD GEL: 2.0000 g | Freq: Every day | TRANSDERMAL | Status: DC | PRN

## 2014-12-02 MED ORDER — VANCOMYCIN HCL 10 G IV SOLR
1250.0000 mg | INTRAVENOUS | Status: DC
  Administered 2014-12-03 – 2014-12-06 (×4): 1250 mg via INTRAVENOUS
  Filled 2014-12-02 (×7): qty 1250

## 2014-12-02 MED ORDER — INSULIN GLARGINE 100 UNIT/ML ~~LOC~~ SOLN
18.0000 [IU] | Freq: Every day | SUBCUTANEOUS | Status: DC
  Administered 2014-12-02 – 2014-12-04 (×4): 18 [IU] via SUBCUTANEOUS
  Filled 2014-12-02 (×5): qty 0.18

## 2014-12-02 MED ORDER — HEPARIN SODIUM (PORCINE) 5000 UNIT/ML IJ SOLN
5000.0000 [IU] | Freq: Three times a day (TID) | INTRAMUSCULAR | Status: DC
  Administered 2014-12-02 – 2014-12-18 (×44): 5000 [IU] via SUBCUTANEOUS
  Filled 2014-12-02 (×49): qty 1

## 2014-12-02 MED ORDER — GLUCAGON (RDNA) 1 MG IJ KIT
1.0000 mg | PACK | Freq: Once | INTRAMUSCULAR | Status: AC | PRN
  Filled 2014-12-02: qty 1

## 2014-12-02 MED ORDER — VANCOMYCIN HCL IN DEXTROSE 1-5 GM/200ML-% IV SOLN
1000.0000 mg | INTRAVENOUS | Status: DC
  Administered 2014-12-02: 1000 mg via INTRAVENOUS
  Filled 2014-12-02: qty 200

## 2014-12-02 MED ORDER — COLLAGENASE 250 UNIT/GM EX OINT
1.0000 "application " | TOPICAL_OINTMENT | Freq: Every day | CUTANEOUS | Status: DC
  Administered 2014-12-02 – 2014-12-07 (×6): 1 via TOPICAL
  Filled 2014-12-02 (×2): qty 30

## 2014-12-02 MED ORDER — ENSURE COMPLETE PO LIQD: 237.0000 mL | Freq: Two times a day (BID) | ORAL | Status: DC

## 2014-12-02 MED ORDER — SODIUM CHLORIDE 0.9 % IV SOLN
INTRAVENOUS | Status: DC
  Administered 2014-12-02: 150 mL/h via INTRAVENOUS
  Administered 2014-12-02 – 2014-12-03 (×3): via INTRAVENOUS

## 2014-12-02 MED ORDER — SODIUM CHLORIDE 0.9 % IV SOLN
1.0000 g | Freq: Three times a day (TID) | INTRAVENOUS | Status: DC
  Administered 2014-12-02 – 2014-12-03 (×6): 1 g via INTRAVENOUS
  Filled 2014-12-02 (×10): qty 1

## 2014-12-02 MED ORDER — ATORVASTATIN CALCIUM 20 MG PO TABS
20.0000 mg | ORAL_TABLET | Freq: Every day | ORAL | Status: DC
  Administered 2014-12-02 – 2014-12-18 (×15): 20 mg via ORAL
  Filled 2014-12-02: qty 2
  Filled 2014-12-02: qty 1
  Filled 2014-12-02 (×2): qty 2
  Filled 2014-12-02: qty 1
  Filled 2014-12-02 (×2): qty 2
  Filled 2014-12-02: qty 1
  Filled 2014-12-02 (×6): qty 2
  Filled 2014-12-02: qty 1
  Filled 2014-12-02 (×2): qty 2
  Filled 2014-12-02 (×2): qty 1
  Filled 2014-12-02: qty 2

## 2014-12-02 NOTE — Progress Notes (Signed)
PT Cancellation Note  Patient Details Name: Jason [name] MRN: [id number] DOB: 01/02/1986   Cancelled Treatment:    Reason Eval/Treat Not Completed: Received orders for [facility] team consult however it does not appear that patient has an acute TBI at this time. Also note patient on strict bedrest and mental status changes are medication related.   MD, please clarify therapy orders (PT, OT, SLP) and if services are needed?  Judyth Demarais 12/02/2014, 8:35 AM Pager [phone]
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
## 2014-12-02 NOTE — Progress Notes (Signed)
Utilization review completed. Jawana Reagor, RN, BSN. 

## 2014-12-02 NOTE — Progress Notes (Signed)
SLP Cancellation Note  Patient Details Name: Jason [name] MRN: [id number] DOB: 1986/01/05   Cancelled treatment:       Reason Eval/Treat Not Completed: Other (comment). Received orders for [facility] team consult however it does not appear that patient has an acute TBI at this time. Also note patient on strict bedrest and mental status changes are medication related. MD, please clarify therapy orders (PT, OT, SLP). Are services needed?  [place], CCC-SLP [phone]  Theodus Ran Meryl 12/02/2014, 8:28 AM

## 2014-12-02 NOTE — Progress Notes (Signed)
Pt alert and oriented to person, place and situation but intermittently is confused and hallucinate stating he has hooks on him. Pt however follows command.

## 2014-12-02 NOTE — Progress Notes (Addendum)
INITIAL NUTRITION ASSESSMENT  DOCUMENTATION CODES Per approved criteria  -Not Applicable   INTERVENTION: Provide Glucerna Shake po TID, each supplement provides 220 kcal and 10 grams of protein.  Encourage adequate PO intake.  NUTRITION DIAGNOSIS: Increased nutrient needs related to wound healing as evidenced by estimated nutrition needs.   Goal: Pt to meet >/= 90% of their estimated nutrition needs   Monitor:  PO intake, weight trends, labs, I/O's  Reason for Assessment: MST  29 y.o. 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  Pt was unavailable during time of visit. Unable to obtain nutrition hx or perform nutrition focused physical exam during this time. Pt however familiar to the RD team. Per last RD note 2/9, pt had a good appetite and has been taking supplements well (Glucerna Shake). Current meal completion has been 50-100%. RD to order Glucerna Shake and Prostat to aid in wound healing. Per Epic weight records, pt with a 19% weight loss in 1 week. Question accuracy?   RD to perform nutrition focused physical exam at next visit.   Labs: Low magnesium. High alkaline phosphatase.  Height: Ht Readings from Last 1 Encounters:  12/02/14 5\' 8"  (1.727 m)    Weight: Wt Readings from Last 1 Encounters:  12/02/14 125 lb 4.8 oz (56.836 kg)    Ideal Body Weight: 154 lbs  % Ideal Body Weight: 81%  Wt Readings from Last 10 Encounters:  12/02/14 125 lb 4.8 oz (56.836 kg)  11/25/14 155 lb (70.308 kg)  11/19/14 155 lb 13.8 oz (70.7 kg)  10/02/14 125 lb 10.6 oz (57 kg)  09/23/14 131 lb 1.6 oz (59.467 kg)  08/13/14 129 lb 6.6 oz (58.7 kg)  08/03/14 139 lb (63.05 kg)  07/30/14 139 lb 1 oz (63.078 kg)  07/19/14 135 lb  12.9 oz (61.6 kg)  05/28/14 138 lb 14.2 oz (63 kg)    Usual Body Weight: 140 lbs (per Epic weight hx)  % Usual Body Weight: 89%  BMI:  Body mass index is 19.06 kg/(m^2).  Estimated Nutritional Needs: Kcal: 2100-2300 Protein: 105-115 grams Fluid: 2.1 - 2.3 L/day  Skin: Stage II pressure ulcer on finger, left buttocks, Stage III on L heel, incision on coccyx, back, sacrum, +1 LE edema  Diet Order: Diet Carb Modified Diet NPO time specified  EDUCATION NEEDS: -No education needs identified at this time   Intake/Output Summary (Last 24 hours) at 12/02/14 1039 Last data filed at 12/02/14 0914  Gross per 24 hour  Intake   1450 ml  Output   1000 ml  Net    450 ml    Last BM: 2/23-ostomy  Labs:   Recent Labs Lab 11/25/14 1945 12/01/14 1605 12/02/14 0410  NA 139 134* 137  K 4.9 4.6 4.8  CL 116* 115* 109  CO2 20 22 24   BUN 5* 9 6  CREATININE 0.49* 0.71 0.62  CALCIUM 9.0 8.9 9.6  MG  --   --  1.2*  GLUCOSE 75 126* 158*    CBG (last 3)   Recent Labs  12/01/14 2341 12/02/14 0145 12/02/14 0645  GLUCAP 78 114* 202*    Scheduled Meds: . atorvastatin  20 mg Oral Daily  . collagenase  1 application Topical Daily  . heparin  5,000 Units Subcutaneous 3 times per day  .  insulin aspart  0-9 Units Subcutaneous TID WC  . insulin glargine  18 Units Subcutaneous QHS  . meropenem (MERREM) IV  1 g Intravenous 3 times per day  . pantoprazole  40 mg Oral Daily  . sodium chloride  3 mL Intravenous Q12H  . vancomycin  1,000 mg Intravenous Q24H    Continuous Infusions: . sodium chloride 150 mL/hr at 12/02/14 0158    Past Medical History  Diagnosis Date  . GERD (gastroesophageal reflux disease)   . Asthma   . Hx MRSA infection     on face  . Gastroparesis   . Diabetic neuropathy   . Seizures   . Stroke     spinal stroke in 4/15  . Diabetes mellitus     sees Dr. Loanne Drilling   . Family history of anesthesia complication     Pt mother can't have epidural procedures   . Dysrhythmia   . Pneumonia   . Arthritis   . Fibromyalgia     Past Surgical History  Procedure Laterality Date  . Tonsillectomy    . Multiple extractions with alveoloplasty N/A 08/03/2014    Procedure: MULTIPLE EXTRACTIONS;  Surgeon: Gae Bon, DDS;  Location: [place];  Service: Oral Surgery;  Laterality: N/A;  . Tee without cardioversion N/A 08/17/2014    Procedure: TRANSESOPHAGEAL ECHOCARDIOGRAM (TEE);  Surgeon: Dorothy Spark, MD;  Location: [facility] ENDOSCOPY;  Service: Cardiovascular;  Laterality: N/A;  . Debridment of decubitus ulcer N/A 10/04/2014    Procedure: DEBRIDMENT OF DECUBITUS ULCER;  Surgeon: Georganna Skeans, MD;  Location: [place];  Service: General;  Laterality: N/A;  . Laparoscopic diverted colostomy N/A 10/12/2014    Procedure: LAPAROSCOPIC DIVERTING COLOSTOMY;  Surgeon: Donnie Mesa, MD;  Location: [place];  Service: General;  Laterality: N/A;  . Insertion of suprapubic catheter N/A 10/12/2014    Procedure: INSERTION OF SUPRAPUBIC CATHETER;  Surgeon: Reece Packer, MD;  Location: [place];  Service: Urology;  Laterality: N/A;  . Incision and drainage of wound N/A 11/12/2014    Procedure: IRRIGATION AND DEBRIDEMENT OF WOUNDS WITH BONE BIOPSY AND SURGICAL PREP ;  Surgeon: Theodoro Kos, DO;  Location: [place];  Service: Plastics;  Laterality: N/A;  . Application of a-cell of back N/A 11/12/2014    Procedure: APPLICATION A CELL AND VAC ;  Surgeon: Theodoro Kos, DO;  Location: [place];  Service: Plastics;  Laterality: N/A;  . Incision and drainage of wound N/A 11/18/2014    Procedure: IRRIGATION AND DEBRIDEMENT OF SACRAL ULCER ONLY WITH PLACEMENT OF A CELL AND VAC/ DRESSING CHANGE TO UPPER BACK AREA.;  Surgeon: Theodoro Kos, DO;  Location: [place];  Service: Plastics;  Laterality: N/A;  . Incision and drainage of wound N/A 11/25/2014    Procedure: IRRIGATION AND DEBRIDEMENT OF SACRAL ULCER AND BACK BURN WITH PLACEMENT OF A-CELL;  Surgeon: Theodoro Kos, DO;  Location: [place];  Service:  Plastics;  Laterality: N/A;  . Minor application of wound vac N/A 11/25/2014    Procedure:  WOUND VAC CHANGE;  Surgeon: Theodoro Kos, DO;  Location: [place];  Service: Plastics;  Laterality: N/A;    Kallie Locks, MS, RD, LDN Pager # [phone] After hours/ weekend pager # [phone]

## 2014-12-02 NOTE — Progress Notes (Signed)
Pt  Admitted from [facility] center with AMS the patient 51 yrs Quadriplegic with Medical diagnoses as HDpertention with multiple wounds

## 2014-12-02 NOTE — Progress Notes (Signed)
Brain Injury Team consult  As per previous PT and SLP notes, does not appear that this order was appropriate for this patient and he remains on strict bedrest. Order has been discontinued.   If PT, OT, or SLP therapies indicated, please order these as needed.   12/02/2014 Barry Brunner, PT Pager: [phone]

## 2014-12-02 NOTE — Progress Notes (Signed)
[facility] PROGRESS NOTE  Jason [name] WEX:[id number] DOB: 03-28-1986 DOA: 12/01/2014 PCP: [name], MD  Assessment/Plan: 1. Altered mental status- patient has complicated history with pneumonia ischial osteomyelitis, Klebsiella bacteremia and Burkhoderia UTI. Infection seems to be controlled with IV antibiotics. [name] has now been stopped, which can supposedly cause altered mental status. EEG has been ordered. 2. 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 It showed multiple food collections within the abductor magnus and abductor brevis muscles likely consistent with intramuscular abscesses. Patient was seen by plastic surgery underwent debridement on 11/12/2014 11/18/2014 and 11/25/2014 with VAC placement. Patient started on vancomycin and ertapenem as per infectious disease now the ertapenem has been changed to meropenem. Blood culture urine culture has been collected. We'll follow the culture results 3. Klebsiella pneumonia bacteremia- blood culture 116 was positive for Klebsiella pneumonia, currently patient on meropenem and vancomycin 4. Burkholderia UTI- continue meropenem and vancomycin 5. Type 1 diabetes mellitus- Lantus 18 units, does decrease from 25 units due to poor by mouth intake. Continue sliding scale insulin 6. Functional quadriplegia, neurogenic bladder- bedbound, secondary to spinal cord infarction  Code Status: Full code Family Communication: *Spoke to patient's mother at bedside Disposition Plan: Home when stable   Consultants:  None  Procedures:  *None  Antibiotics:  Vancomycin  Meropenem  HPI/Subjective: 29 y.o. 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 Patient was recently hospitalized from 2/1 to 2/12. He was treated for ischial osteomyelitis, pneumonia, Klebsiella bacteremia, and Burkhoderia UTI in hospital. Patient was discharged on IV vancomycin plus ertapenem, which are supposed to be continued for total of 6 weeks per ID. Patient has been doing okay until 3 days ago when he started hallucinating. It seems that he is on the phone talking to somebody who is not there. Mother reports pt has not been sleeping at all in the past 3 days. Mother states that patient had hallucination 3 months ago, which was likely caused by morphine, since his symptoms resolved after discontinuation of morphine. This time, mother discontinued patient's Percocet, Lyrica and baclofen without any help. His mother states that patient developed new rashes over his forehead and upper face. He has back pain. No headache, neck rigidity or photophobia. Patient denies fever, chills, cough, chest pain, SOB, nausea, vomiting, abdominal pain, diarrhea, dysuria, urgency, frequency, hematuria or leg swelling.  In ED, patient was found to have negative CT head for acute abnormalities. Leukocytosis with WBC 13.0, negative UDS, lactate 1.06, positive urinalysis for moderate leukocytes, negative salicylate and acetaminophen level, ammonia 17, temperature normal. Electrolytes okay. Patient's admitted to inpatient for further evaluation and treatment.  Patient seen and examined, mother at bedside, he is still having hallucinations  Objective: Filed Vitals:   12/02/14 1300  BP: 121/80  Pulse: 112  Temp: 99 F (37.2 C)  Resp:     Intake/Output Summary (Last 24 hours) at 12/02/14 1752 Last data filed at 12/02/14 1518  Gross per 24 hour  Intake   2227 ml  Output   1000 ml  Net   1227 ml   Filed Weights   12/02/14 0114 12/02/14 0646  Weight: 53.887 kg (118 lb 12.8 oz) 56.836 kg (125 lb 4.8 oz)    Exam:   General:  *Appear in no acute distress  Cardiovascular:  S1-S2  regular  Respiratory: Clear bilaterally  Abdomen: Soft nontender no organomegaly  Musculoskeletal: No edema    Data Reviewed: Basic Metabolic Panel:  Recent Labs Lab 11/25/14 1945 12/01/14 1605 12/02/14 0410  NA 139 134* 137  K 4.9 4.6 4.8  CL 116* 115* 109  CO2 20 22 24   GLUCOSE 75 126* 158*  BUN 5* 9 6  CREATININE 0.49* 0.71 0.62  CALCIUM 9.0 8.9 9.6  MG  --   --  1.2*   Liver Function Tests:  Recent Labs Lab 12/01/14 1605 12/02/14 0410  AST 25 23  ALT 19 18  ALKPHOS 128* 122*  BILITOT 0.4 0.6  PROT 7.8 7.6  ALBUMIN 2.7* 2.4*   No results for input(s): LIPASE, AMYLASE in the last 168 hours.  Recent Labs Lab 12/01/14 2008  AMMONIA 17   CBC:  Recent Labs Lab 12/01/14 1605 12/02/14 0410  WBC 13.0* 13.6*  HGB 8.8* 8.6*  HCT 27.7* 26.9*  MCV 84.5 82.0  PLT 640* 621*   Cardiac Enzymes: No results for input(s): CKTOTAL, CKMB, CKMBINDEX, TROPONINI in the last 168 hours. BNP (last 3 results) No results for input(s): BNP in the last 8760 hours.  ProBNP (last 3 results)  Recent Labs  01/29/14 2113  PROBNP 1343.0*    CBG:  Recent Labs Lab 12/01/14 2341 12/02/14 0145 12/02/14 0645 12/02/14 1108 12/02/14 1621  GLUCAP 78 114* 202* 192* 204*    Recent Results (from the past 240 hour(s))  Blood culture (routine x 2)     Status: None (Preliminary result)   Collection Time: 12/01/14  4:05 PM  Result Value Ref Range Status   Specimen Description BLOOD  LAC  Final   Special Requests BOTTLES DRAWN AEROBIC AND ANAEROBIC 5ML EACH  Final   Culture   Final           BLOOD CULTURE RECEIVED NO GROWTH TO DATE CULTURE WILL BE HELD FOR 5 DAYS BEFORE ISSUING A FINAL NEGATIVE REPORT Performed at [facility]    Report Status PENDING  Incomplete     Studies: Ct Head Wo Contrast  12/01/2014   CLINICAL DATA:  Allergic reaction to pain meds withdrawal. Pt has had hallucinations and altered mental status x 3 days per mom  EXAM: CT HEAD WITHOUT  CONTRAST  TECHNIQUE: Contiguous axial images were obtained from the base of the skull through the vertex without intravenous contrast.  COMPARISON:  Head CT 07/29/2014, MRI 11 07/2014  FINDINGS: No acute intracranial hemorrhage. No focal mass lesion. No CT evidence of acute infarction. No midline shift or mass effect. No hydrocephalus. Basilar cisterns are patent. Paranasal sinuses and mastoid air cells are clear.  IMPRESSION: History no acute   Electronically Signed   By: Suzy Bouchard M.D.   On: 12/01/2014 18:32   Dg Chest Port 1 View  12/02/2014   CLINICAL DATA:  Shortness of breath.  PICC in place.  EXAM: PORTABLE CHEST - 1 VIEW  COMPARISON:  11/19/2014  FINDINGS: Tip of the right upper extremity PICC in the mid-distal SVC. Cardiomediastinal contours are normal. Decreased left basilar airspace disease with minimal residual linear opacities. The lungs are otherwise clear. There is no pleural effusion or pneumothorax. Unchanged appearance of the right distal clavicle with probable posttraumatic deformity.  IMPRESSION: 1. Tip of the right upper extremity PICC in the mid distal SVC. 2. Improved left basilar aeration with minimal residual linear opacity, likely atelectasis.   Electronically Signed   By: Fonnie Birkenhead.D.  On: 12/02/2014 03:28    Scheduled Meds: . atorvastatin  20 mg Oral Daily  . collagenase  1 application Topical Daily  . feeding supplement (GLUCERNA SHAKE)  237 mL Oral TID BM  . heparin  5,000 Units Subcutaneous 3 times per day  . insulin aspart  0-9 Units Subcutaneous TID WC  . insulin glargine  18 Units Subcutaneous QHS  . meropenem (MERREM) IV  1 g Intravenous 3 times per day  . pantoprazole  40 mg Oral Daily  . sodium chloride  3 mL Intravenous Q12H  . [START ON 12/03/2014] vancomycin  1,250 mg Intravenous Q24H   Continuous Infusions: . sodium chloride 150 mL/hr at 12/02/14 0158    Principal Problem:   Acute encephalopathy Active Problems:   Type 1 diabetes,  uncontrolled, with neuropathy   Asthma   GERD   Gastroparesis due to DM   Protein-calorie malnutrition, severe   Spinal cord infarction (history of)   Tetraplegia   Neurogenic bladder   UTI (urinary tract infection)   Sacral pressure sore   Stage IV decubitus ulcer   Osteomyelitis, pelvis   Anemia   Burkholderia cepacia infection   Disorientation    Time spent: 20 minutes    [facility] Pager [phone]. If 7PM-7AM, please contact night-coverage at [email or web address], password [facility] 12/02/2014, 5:52 PM  LOS: 1 day

## 2014-12-02 NOTE — Evaluation (Signed)
SLP Cancellation Note  Patient Details Name: Jason [name] MRN: [id number] DOB: Jan 28, 1986   Cancelled treatment:       Reason Eval/Treat Not Completed:  (note seeking clarification from MD for order)  Luanna Salk, [facility] SLP [phone]

## 2014-12-02 NOTE — Progress Notes (Addendum)
ANTIBIOTIC CONSULT NOTE - INITIAL  Pharmacy Consult for Meropenem Indication: Ischial osteomyelitis and Burkholderia UTI  Allergies  Allergen Reactions  . Cefuroxime Axetil Anaphylaxis  . Morphine And Related Other (See Comments)    Changed mental status, confusion, headache, visual hallucination  . Penicillins Anaphylaxis and Other (See Comments)    ?can take amoxicillin?  . Sulfa Antibiotics Anaphylaxis, Shortness Of Breath and Other (See Comments)  . Tessalon [Benzonatate] Anaphylaxis  . Shellfish Allergy Itching and Other (See Comments)    Took benadryl to alleviate reaction    Patient Measurements: Height: 5\' 8"  (172.7 cm) (from 11/08/14) Weight: 118 lb 12.8 oz (53.887 kg) IBW/kg (Calculated) : 68.4   Vital Signs: Temp: 98.1 F (36.7 C) (02/24 2146) Temp Source: Oral (02/24 2146) BP: 130/86 mmHg (02/24 2146) Pulse Rate: 90 (02/24 2146) Intake/Output from previous day:   Intake/Output from this shift:    Labs:  Recent Labs  12/01/14 1605  WBC 13.0*  HGB 8.8*  PLT 640*  CREATININE 0.71   Estimated Creatinine Clearance: 102.9 mL/min (by C-G formula based on Cr of 0.71). No results for input(s): VANCOTROUGH, VANCOPEAK, VANCORANDOM, GENTTROUGH, GENTPEAK, GENTRANDOM, TOBRATROUGH, TOBRAPEAK, TOBRARND, AMIKACINPEAK, AMIKACINTROU, AMIKACIN in the last 72 hours.   Microbiology: Recent Results (from the past 720 hour(s))  Urine culture     Status: None   Collection Time: 11/08/14  3:13 PM  Result Value Ref Range Status   Specimen Description URINE, CATHETERIZED  Final   Special Requests NONE  Final   Colony Count   Final    >=100,000 COLONIES/ML Performed at [facility]   Final    BURKHOLDERIA CEPACIA Performed at [facility]    Report Status 11/11/2014 FINAL  Final   Organism ID, Bacteria BURKHOLDERIA CEPACIA  Final      Susceptibility   Burkholderia cepacia - MIC*    LEVOFLOXACIN 4 INTERMEDIATE Intermediate     TRIMETH/SULFA  160 RESISTANT Resistant     CEFTAZIDIME 8 SENSITIVE Sensitive     * BURKHOLDERIA CEPACIA  Culture, blood (routine x 2)     Status: None   Collection Time: 11/08/14  5:25 PM  Result Value Ref Range Status   Specimen Description BLOOD ARM LEFT  Final   Special Requests BOTTLES DRAWN AEROBIC AND ANAEROBIC 10CC  Final   Culture   Final    KLEBSIELLA PNEUMONIAE Note: Culture results may be compromised due to an excessive volume of blood received in culture bottles. Gram Stain Report Called to,Read Back By and Verified With: Juanetta Beets 11/12/14 0144A Rives Performed at [facility]    Report Status 11/14/2014 FINAL  Final   Organism ID, Bacteria KLEBSIELLA PNEUMONIAE  Final      Susceptibility   Klebsiella pneumoniae - MIC*    AMPICILLIN RESISTANT      AMPICILLIN/SULBACTAM 4 SENSITIVE Sensitive     CEFAZOLIN <=4 SENSITIVE Sensitive     CEFEPIME <=1 SENSITIVE Sensitive     CEFTAZIDIME <=1 SENSITIVE Sensitive     CEFTRIAXONE <=1 SENSITIVE Sensitive     CIPROFLOXACIN <=0.25 SENSITIVE Sensitive     GENTAMICIN <=1 SENSITIVE Sensitive     IMIPENEM <=0.25 SENSITIVE Sensitive     PIP/TAZO <=4 SENSITIVE Sensitive     TOBRAMYCIN <=1 SENSITIVE Sensitive     TRIMETH/SULFA <=20 SENSITIVE Sensitive     * KLEBSIELLA PNEUMONIAE  Culture, blood (routine x 2)     Status: None   Collection Time: 11/08/14  5:35 PM  Result  Value Ref Range Status   Specimen Description BLOOD ARM LEFT  Final   Special Requests BOTTLES DRAWN AEROBIC AND ANAEROBIC 10CC  Final   Culture   Final    KLEBSIELLA PNEUMONIAE Note: SUSCEPTIBILITIES PERFORMED ON PREVIOUS CULTURE WITHIN THE LAST 5 DAYS. Gram Stain Report Called to,Read Back By and Verified With: Iran Sizer RN @ [id number] [facility] 11/12/14 Performed at [facility]    Report Status 11/14/2014 FINAL  Final  Clostridium Difficile by PCR     Status: None   Collection Time: 11/12/14  8:41 AM  Result Value Ref Range Status   C difficile by pcr NEGATIVE  NEGATIVE Final  Culture, blood (routine x 2)     Status: None   Collection Time: 11/12/14  8:04 PM  Result Value Ref Range Status   Specimen Description BLOOD LEFT ARM  Final   Special Requests BOTTLES DRAWN AEROBIC AND ANAEROBIC 10CC  Final   Culture   Final    NO GROWTH 5 DAYS Note: Culture results may be compromised due to an excessive volume of blood received in culture bottles. Performed at [facility]    Report Status 11/19/2014 FINAL  Final  Culture, blood (routine x 2)     Status: None   Collection Time: 11/12/14  8:19 PM  Result Value Ref Range Status   Specimen Description BLOOD LEFT ARM  Final   Special Requests   Final    BOTTLES DRAWN AEROBIC AND ANAEROBIC BLUE 10CC RED 5CC   Culture   Final    NO GROWTH 5 DAYS Performed at [facility]    Report Status 11/19/2014 FINAL  Final  Body fluid culture     Status: None   Collection Time: 11/15/14 11:03 AM  Result Value Ref Range Status   Specimen Description FLUID HIP RIGHT SYNOVIAL  Final   Special Requests NONE  Final   Gram Stain   Final    RARE WBC PRESENT, PREDOMINANTLY MONONUCLEAR NO ORGANISMS SEEN Performed at [facility]    Culture   Final    NO GROWTH 3 DAYS Performed at [facility]    Report Status 11/19/2014 FINAL  Final  Blood Cultures x 2 sites     Status: None   Collection Time: 11/16/14  7:00 PM  Result Value Ref Range Status   Specimen Description BLOOD RIGHT ARM  Final   Special Requests BOTTLES DRAWN AEROBIC ONLY 10CC  Final   Culture   Final    NO GROWTH 5 DAYS Performed at [facility]    Report Status 11/23/2014 FINAL  Final  Blood Cultures x 2 sites     Status: None   Collection Time: 11/16/14  7:12 PM  Result Value Ref Range Status   Specimen Description BLOOD LEFT HAND  Final   Special Requests BOTTLES DRAWN AEROBIC ONLY 1CC  Final   Culture   Final    NO GROWTH 5 DAYS Performed at [facility]    Report Status 11/23/2014 FINAL   Final    Medical History: Past Medical History  Diagnosis Date  . GERD (gastroesophageal reflux disease)   . Asthma   . Hx MRSA infection     on face  . Gastroparesis   . Diabetic neuropathy   . Seizures   . Stroke     spinal stroke in 4/15  . Diabetes mellitus     sees Dr. Loanne Drilling   . Family history of anesthesia complication  Pt mother can't have epidural procedures  . Dysrhythmia   . Pneumonia   . Arthritis   . Fibromyalgia     Medications:  See medication reconciliation when completed  Assessment: 29 y.o male ischial osteomyelitis currently being treated with IV vancomycin and ertapenem prior to this admission. Pharmacist asked to dose Meropenem for ischial osteomyelitis and Burkholderia UTI.  SCr 0.71, CrCl ~ 100 ml/min. AFebrile.  Weight 118 lb  Patient has a hx of asthma, MRSA infection, seizures, spinal stroke in 4/15, PNA, UTI, ischial osteomyelitis (PTA being treated with IV vancomycin and ertapenem), diabetes mellitus, GERD, who presents with hallucinations and altered mental status.  Urine and blood cultures pending.   Goal of Therapy:  Dose appropriate for renal function  Plan:  Meropenem 1gm IV q8h Monitor renal function, clinical status daily. Follow up culture results. MD has continued his  vancomycin 1 gm IV q24hr.-MD dosing. Need to f/u with [facility] regarding dose, levels.    Nicole Cella, RPh Clinical Pharmacist Pager: [phone] 12/02/2014,2:06 AM   Addendum: Vancomycin to be managed by pharmacy. A trough was checked today and it was slightly low (goal 15-20). Dosing and levels have been variable in the past.   Plan: 1. Change vanc to 1250mg  IV Q24H 2. F/u renal fxn, C&S, clinical status and trough at [facility], PharmD, BCPS Pager # [phone] 12/02/2014 11:59 AM

## 2014-12-03 DIAGNOSIS — G9519 Other vascular myelopathies: Secondary | ICD-10-CM

## 2014-12-03 LAB — BASIC METABOLIC PANEL
ANION GAP: 5 (ref 5–15)
BUN: 6 mg/dL (ref 6–23)
CHLORIDE: 110 mmol/L (ref 96–112)
CO2: 22 mmol/L (ref 19–32)
Calcium: 9.1 mg/dL (ref 8.4–10.5)
Creatinine, Ser: 0.62 mg/dL (ref 0.50–1.35)
GFR calc Af Amer: >90 mL/min (ref >90)
GFR calc non Af Amer: >90 mL/min (ref >90)
Glucose, Bld: 149 mg/dL — ABNORMAL HIGH (ref 70–99)
Potassium: 3.8 mmol/L (ref 3.5–5.1)
Sodium: 137 mmol/L (ref 135–145)

## 2014-12-03 LAB — CBC
HCT: 25.2 % — ABNORMAL LOW (ref 39.0–52.0)
HEMOGLOBIN: 8 g/dL — AB (ref 13.0–17.0)
MCH: 25.9 pg — ABNORMAL LOW (ref 26.0–34.0)
MCHC: 31.7 g/dL (ref 30.0–36.0)
MCV: 81.6 fL (ref 78.0–100.0)
Platelets: 540 10*3/uL — ABNORMAL HIGH (ref 150–400)
RBC: 3.09 MIL/uL — ABNORMAL LOW (ref 4.22–5.81)
RDW: 17.3 % — AB (ref 11.5–15.5)
WBC: 14.2 10*3/uL — ABNORMAL HIGH (ref 4.0–10.5)

## 2014-12-03 LAB — GLUCOSE, CAPILLARY
GLUCOSE-CAPILLARY: 125 mg/dL — AB (ref 70–99)
GLUCOSE-CAPILLARY: 86 mg/dL (ref 70–99)
Glucose-Capillary: 295 mg/dL — ABNORMAL HIGH (ref 70–99)
Glucose-Capillary: 361 mg/dL — ABNORMAL HIGH (ref 70–99)

## 2014-12-03 LAB — URINE CULTURE
Colony Count: NO GROWTH
Culture: NO GROWTH

## 2014-12-03 LAB — TSH: TSH: 7.887 u[IU]/mL — AB (ref 0.350–4.500)

## 2014-12-03 MED ORDER — HALOPERIDOL LACTATE 5 MG/ML IJ SOLN
INTRAMUSCULAR | Status: AC
  Filled 2014-12-03: qty 1

## 2014-12-03 MED ORDER — HALOPERIDOL LACTATE 5 MG/ML IJ SOLN
2.0000 mg | Freq: Four times a day (QID) | INTRAMUSCULAR | Status: DC | PRN
Start: 2014-12-03 — End: 2014-12-08
  Administered 2014-12-03 – 2014-12-04 (×4): 2 mg via INTRAVENOUS
  Filled 2014-12-03 (×2): qty 1

## 2014-12-03 MED ORDER — HYDRALAZINE HCL 25 MG PO TABS
25.0000 mg | ORAL_TABLET | Freq: Four times a day (QID) | ORAL | Status: DC | PRN
  Administered 2014-12-03 – 2014-12-14 (×2): 25 mg via ORAL
  Filled 2014-12-03 (×4): qty 1

## 2014-12-03 NOTE — Progress Notes (Signed)
Late entry:  VAC Dressing change done 12/02/14 3:30 pm Subjective: Events over past few days noted.  Patient seen for [facility] dressing change S/P Acell and VAC placement last week. Patient agitated and hallucinating during dressing change.   The VAC dressing over the sacrum and R ischial area was not sealed upon my arrival.  The dressing was removed and the sacral and R ischial wound beds show dark pink tissue and minimal to no residual Acell present. There is no odor and scant serous drainage.  The VAC dressing was reapplied to the wound bed.  He will need another dressing change early next week and we would like to take him back to the OR next Wed or Thursday for application of more Acell while he is hospitalized if okay with primary team.   Objective: Vital signs in last 24 hours: Temp:  [98.8 F (37.1 C)-99 F (37.2 C)] 98.9 F (37.2 C) (02/26 0522) Pulse Rate:  [90-112] 98 (02/26 0522) Resp:  [17] 17 (02/26 0522) BP: (120-144)/(72-98) 138/82 mmHg (02/26 0522) SpO2:  [100 %] 100 % (02/26 0522) Weight:  [51.256 kg (113 lb)] 51.256 kg (113 lb) (02/26 0500) Last BM Date: 12/02/14  Intake/Output from previous day: 02/25 0701 - 02/26 0700 In: 1257 [P.O.:957; IV Piggyback:300] Out: 3700 [Urine:3200; Drains:100; Stool:400] Intake/Output this shift:    General appearance: Agitated at times during dressing change. Mom assists with holding and calming patient The sacral and R ischial wounds are dark pink and there is minimal residual Acell present in the wound beds.  Lab Results:   Recent Labs  12/02/14 0410 12/03/14 0558  WBC 13.6* 14.2*  HGB 8.6* 8.0*  HCT 26.9* 25.2*  PLT 621* 540*   BMET  Recent Labs  12/02/14 0410 12/03/14 0558  NA 137 137  K 4.8 3.8  CL 109 110  CO2 24 22  GLUCOSE 158* 149*  BUN 6 6  CREATININE 0.62 0.62  CALCIUM 9.6 9.1   PT/INR  Recent Labs  12/02/14 0410  LABPROT 14.3  INR 1.10   ABG  Recent Labs  12/01/14 1755  PHART 7.342*   HCO3 18.9*    Studies/Results: Ct Head Wo Contrast  12/01/2014   CLINICAL DATA:  Allergic reaction to pain meds withdrawal. Pt has had hallucinations and altered mental status x 3 days per mom  EXAM: CT HEAD WITHOUT CONTRAST  TECHNIQUE: Contiguous axial images were obtained from the base of the skull through the vertex without intravenous contrast.  COMPARISON:  Head CT 07/29/2014, MRI 11 07/2014  FINDINGS: No acute intracranial hemorrhage. No focal mass lesion. No CT evidence of acute infarction. No midline shift or mass effect. No hydrocephalus. Basilar cisterns are patent. Paranasal sinuses and mastoid air cells are clear.  IMPRESSION: History no acute   Electronically Signed   By: Suzy Bouchard M.D.   On: 12/01/2014 18:32   Dg Chest Port 1 View  12/02/2014   CLINICAL DATA:  Shortness of breath.  PICC in place.  EXAM: PORTABLE CHEST - 1 VIEW  COMPARISON:  11/19/2014  FINDINGS: Tip of the right upper extremity PICC in the mid-distal SVC. Cardiomediastinal contours are normal. Decreased left basilar airspace disease with minimal residual linear opacities. The lungs are otherwise clear. There is no pleural effusion or pneumothorax. Unchanged appearance of the right distal clavicle with probable posttraumatic deformity.  IMPRESSION: 1. Tip of the right upper extremity PICC in the mid distal SVC. 2. Improved left basilar aeration with minimal residual linear opacity, likely  atelectasis.   Electronically Signed   By: Jeb Levering M.D.   On: 12/02/2014 03:28    Anti-infectives: Anti-infectives    Start     Dose/Rate Route Frequency Ordered Stop   12/03/14 0800  vancomycin (VANCOCIN) 1,250 mg in sodium chloride 0.9 % 250 mL IVPB     1,250 mg 166.7 mL/hr over 90 Minutes Intravenous Every 24 hours 12/02/14 1158     12/02/14 1130  vancomycin (VANCOCIN) IVPB 1000 mg/200 mL premix  Status:  Discontinued     1,000 mg 200 mL/hr over 60 Minutes Intravenous Every 24 hours 12/02/14 0119 12/02/14 1155    12/02/14 0300  meropenem (MERREM) 1 g in sodium chloride 0.9 % 100 mL IVPB     1 g 200 mL/hr over 30 Minutes Intravenous 3 times per day 12/02/14 0212        Assessment/Plan: s/p * No surgery found * VAC dressing was applied to the sacral and ischial wounds  Would like to take the patient back to the OR next Wed or Thursday for more application of Acell Will need another VAC dressing change on Monday.   LOS: 2 days    Morrill Bomkamp,PA-C Plastic Surgery [phone]

## 2014-12-03 NOTE — Progress Notes (Signed)
[facility] PROGRESS NOTE  Jason [name] NOB:[id number] DOB: 04-13-1986 DOA: 12/01/2014 PCP: [name], MD  Assessment/Plan: 1. Altered mental status- patient has complicated history with pneumonia ischial osteomyelitis, Klebsiella bacteremia and Burkhoderia UTI. Infection seems to be controlled with IV antibiotics. [name] has now been stopped, which can supposedly cause altered mental status. EEG has been ordered. Will obtain neurology consultation 2. 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 It showed multiple food collections within the abductor magnus and abductor brevis muscles likely consistent with intramuscular abscesses. Patient was seen by plastic surgery underwent debridement on 11/12/2014 11/18/2014 and 11/25/2014 with VAC placement. Patient started on vancomycin and ertapenem as per infectious disease now the ertapenem has been changed to meropenem. Blood culture urine culture has been collected. We'll follow the culture results 3. Klebsiella pneumonia bacteremia- blood culture 116 was positive for Klebsiella pneumonia, currently patient on meropenem and vancomycin 4. Burkholderia UTI- continue meropenem and vancomycin 5. Type 1 diabetes mellitus- Lantus 18 units, does decrease from 25 units due to poor by mouth intake. Continue sliding scale insulin 6. Functional quadriplegia, neurogenic bladder- bedbound, secondary to spinal cord infarction  Code Status: Full code Family Communication: *Spoke to patient's mother at bedside Disposition Plan: Home when stable   Consultants:  None  Procedures:  *None  Antibiotics:  Vancomycin  Meropenem  HPI/Subjective: 29 y.o. 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 Patient was recently hospitalized from 2/1 to 2/12. He was treated for ischial osteomyelitis, pneumonia, Klebsiella bacteremia, and Bur[place]oderia UTI in hospital. Patient was discharged on IV vancomycin plus ertapenem, which are supposed to be continued for total of 6 weeks per ID. Patient has been doing okay until 3 days ago when he started hallucinating. It seems that he is on the phone talking to somebody who is not there. Mother reports pt has not been sleeping at all in the past 3 days. Mother states that patient had hallucination 3 months ago, which was likely caused by morphine, since his symptoms resolved after discontinuation of morphine. This time, mother discontinued patient's Percocet, Lyrica and baclofen without any help. His mother states that patient developed new rashes over his forehead and upper face. He has back pain. No headache, neck rigidity or photophobia. Patient denies fever, chills, cough, chest pain, SOB, nausea, vomiting, abdominal pain, diarrhea, dysuria, urgency, frequency, hematuria or leg swelling.  In ED, patient was found to have negative CT head for acute abnormalities. Leukocytosis with WBC 13.0, negative UDS, lactate 1.06, positive urinalysis for moderate leukocytes, negative salicylate and acetaminophen level, ammonia 17, temperature normal. Electrolytes okay. Patient's admitted to inpatient for further evaluation and treatment.  Patient seen and examined, mother at bedside, he is still having hallucinations  Objective: Filed Vitals:   12/03/14 1300  BP: 185/84  Pulse: 91  Temp: 98.7 F (37.1 C)  Resp: 16    Intake/Output Summary (Last 24 hours) at 12/03/14 1626 Last data filed at 12/03/14 1424  Gross per 24 hour  Intake   1070 ml  Output   4300 ml  Net  -3230 ml   Filed Weights   12/02/14 0114 12/02/14 0646 12/03/14 0500  Weight: 53.887 kg (118 lb 12.8 oz) 56.836 kg (125 lb 4.8 oz) 51.256 kg (113 lb)     Exam:  General:  *Appear in no acute distress  Cardiovascular: S1-S2 regular  Respiratory: Clear bilaterally  Abdomen: Soft nontender no organomegaly  Musculoskeletal: No edema    Data Reviewed: Basic Metabolic Panel:  Recent Labs Lab 12/01/14 1605 12/02/14 0410 12/03/14 0558  NA 134* 137 137  K 4.6 4.8 3.8  CL 115* 109 110  CO2 22 24 22   GLUCOSE 126* 158* 149*  BUN 9 6 6   CREATININE 0.71 0.62 0.62  CALCIUM 8.9 9.6 9.1  MG  --  1.2*  --    Liver Function Tests:  Recent Labs Lab 12/01/14 1605 12/02/14 0410  AST 25 23  ALT 19 18  ALKPHOS 128* 122*  BILITOT 0.4 0.6  PROT 7.8 7.6  ALBUMIN 2.7* 2.4*   No results for input(s): LIPASE, AMYLASE in the last 168 hours.  Recent Labs Lab 12/01/14 2008  AMMONIA 17   CBC:  Recent Labs Lab 12/01/14 1605 12/02/14 0410 12/03/14 0558  WBC 13.0* 13.6* 14.2*  HGB 8.8* 8.6* 8.0*  HCT 27.7* 26.9* 25.2*  MCV 84.5 82.0 81.6  PLT 640* 621* 540*   Cardiac Enzymes: No results for input(s): CKTOTAL, CKMB, CKMBINDEX, TROPONINI in the last 168 hours. BNP (last 3 results) No results for input(s): BNP in the last 8760 hours.  ProBNP (last 3 results)  Recent Labs  01/29/14 2113  PROBNP 1343.0*    CBG:  Recent Labs Lab 12/02/14 1108 12/02/14 1621 12/02/14 2120 12/03/14 0634 12/03/14 1116  GLUCAP 192* 204* 243* 125* 86    Recent Results (from the past 240 hour(s))  Blood culture (routine x 2)     Status: None (Preliminary result)   Collection Time: 12/01/14  4:05 PM  Result Value Ref Range Status   Specimen Description BLOOD  LAC  Final   Special Requests BOTTLES DRAWN AEROBIC AND ANAEROBIC 5ML EACH  Final   Culture   Final           BLOOD CULTURE RECEIVED NO GROWTH TO DATE CULTURE WILL BE HELD FOR 5 DAYS BEFORE ISSUING A FINAL NEGATIVE REPORT Performed at [facility]    Report Status PENDING  Incomplete  Urine culture     Status: None   Collection Time: 12/02/14  2:25 AM  Result  Value Ref Range Status   Specimen Description URINE, CATHETERIZED  Final   Special Requests NONE  Final   Colony Count NO GROWTH Performed at [facility]   Final   Culture NO GROWTH Performed at [facility]   Final   Report Status 12/03/2014 FINAL  Final     Studies: Ct Head Wo Contrast  12/01/2014   CLINICAL DATA:  Allergic reaction to pain meds withdrawal. Pt has had hallucinations and altered mental status x 3 days per mom  EXAM: CT HEAD WITHOUT CONTRAST  TECHNIQUE: Contiguous axial images were obtained from the base of the skull through the vertex without intravenous contrast.  COMPARISON:  Head CT 07/29/2014, MRI 11 07/2014  FINDINGS: No acute intracranial hemorrhage. No focal mass lesion. No CT evidence of acute infarction. No midline shift or mass effect. No hydrocephalus. Basilar cisterns are patent. Paranasal sinuses and mastoid air cells are clear.  IMPRESSION: History no acute   Electronically Signed   By: Suzy Bouchard M.D.   On: 12/01/2014 18:32   Dg Chest Port 1 View  12/02/2014   CLINICAL DATA:  Shortness of breath.  PICC in place.  EXAM: PORTABLE CHEST - 1 VIEW  COMPARISON:  11/19/2014  FINDINGS: Tip of the right upper extremity PICC in the mid-distal SVC. Cardiomediastinal contours are normal. Decreased left basilar airspace disease with minimal residual linear opacities. The lungs are otherwise clear. There is no pleural effusion or pneumothorax. Unchanged appearance of the right distal clavicle with probable posttraumatic deformity.  IMPRESSION: 1. Tip of the right upper extremity PICC in the mid distal SVC. 2. Improved left basilar aeration with minimal residual linear opacity, likely atelectasis.   Electronically Signed   By: Jeb Levering M.D.   On: 12/02/2014 03:28    Scheduled Meds: . atorvastatin  20 mg Oral Daily  . collagenase  1 application Topical Daily  . feeding supplement (GLUCERNA SHAKE)  237 mL Oral TID BM  . heparin  5,000 Units  Subcutaneous 3 times per day  . insulin aspart  0-9 Units Subcutaneous TID WC  . insulin glargine  18 Units Subcutaneous QHS  . meropenem (MERREM) IV  1 g Intravenous 3 times per day  . pantoprazole  40 mg Oral Daily  . sodium chloride  3 mL Intravenous Q12H  . vancomycin  1,250 mg Intravenous Q24H   Continuous Infusions: . sodium chloride 20 mL/hr at 12/03/14 1300    Principal Problem:   Acute encephalopathy Active Problems:   Type 1 diabetes, uncontrolled, with neuropathy   Asthma   GERD   Gastroparesis due to DM   Protein-calorie malnutrition, severe   Spinal cord infarction (history of)   Tetraplegia   Neurogenic bladder   UTI (urinary tract infection)   Sacral pressure sore   Stage IV decubitus ulcer   Osteomyelitis, pelvis   Anemia   Burkholderia cepacia infection   Disorientation    Time spent: 20 minutes    [facility] Pager [phone]. If 7PM-7AM, please contact night-coverage at [email or web address], password [facility] 12/03/2014, 4:26 PM  LOS: 2 days

## 2014-12-03 NOTE — Progress Notes (Signed)
Patients BP elevated 185/84. Pt extremely agitated and speech garbled with hallucinations. MD notified and ordered PRN hydralizine PO when BP > 160/100. Ativan having no effect on patient as reported by previous nurses and patients mother at bedside. Haldol ordered PRN q6.

## 2014-12-03 NOTE — Consult Note (Signed)
WOC reviewed chart, seen by plastic surgery. They are managing the sacral, ischial, and back wounds. Please contact plastic surgery for any questions regarding these wounds or the NPWT VAC dressings.  Nicolas Banh [facility] RN,CWOCN

## 2014-12-03 NOTE — Progress Notes (Signed)
NUTRITION FOLLOW UP  Pt meets criteria for SEVERE MALNUTRITION in the context of chronic illness as evidenced by a 9.6% weight loss in 2 months and severe muscle mass loss.  DOCUMENTATION CODES Per approved criteria  -Severe Malnutrition in the context of chronic illness -Underweight   INTERVENTION: Continue Glucerna Shake po TID, each supplement provides 220 kcal and 10 grams of protein.  Encourage adequate PO intake.  NUTRITION DIAGNOSIS: Increased nutrient needs related to wound healing as evidenced by estimated nutrition needs; ongoing  Goal: Pt to meet >/= 90% of their estimated nutrition needs; met  Monitor:  PO intake, weight trends, labs, I/O's  29 y.o. 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  Meal completion has been 50-100%. Pt however needs assist with feedings. Family member at bedside as pt with AMS. She reports pt usually has a great appetite with no other difficulties until Tuesday 2/23 when she noticed the pt had a lack of interest in food. She reports however pt ha been eating well as she would feed him. She reports pt has lost weight with his usual body weight of 130-140 lbs. Per Epic weight records, pt with a 9.6% weight loss in 2 months. She reports pt usually drinks Glucerna Shake 2-3 times daily. Pt currently has been consuming his Glucerna Shake fine. Family member was educated about pt's increase in nutrient needs especially protein needs to aid in wound healing.   Nutrition Focused Physical Exam:  Subcutaneous Fat:  Orbital Region: N/A Upper Arm Region: WNL Thoracic and Lumbar Region: Moderate depletion  Muscle:  Temple Region: WNL Clavicle Bone Region: WNL Clavicle and Acromion Bone Region:  Moderate depletion Scapular Bone Region: N/A Dorsal Hand: WNL Patellar Region: Severe depletion Anterior Thigh Region: Severe depletion Posterior Calf Region: Severe depletion  Edema: +1 LE   Labs and medication reviewed.  Height: Ht Readings from Last 1 Encounters:  12/02/14 5' 8"  (1.727 m)    Weight: Wt Readings from Last 1 Encounters:  12/03/14 113 lb (51.256 kg)    BMI:  Body mass index is 17.19 kg/(m^2). Underweight  Re-Estimated Nutritional Needs: Kcal: 2100-2300 Protein: 105-115 grams Fluid: 2.1 - 2.3 L/day  Skin: Stage II pressure ulcer on finger, left buttocks, Stage III on L heel, incision on coccyx, back, sacrum, +1 LE edema  Diet Order: Diet Carb Modified   Intake/Output Summary (Last 24 hours) at 12/03/14 1447 Last data filed at 12/03/14 1424  Gross per 24 hour  Intake   1170 ml  Output   4300 ml  Net  -3130 ml    Last BM: 2/25-ostomy  Labs:   Recent Labs Lab 12/01/14 1605 12/02/14 0410 12/03/14 0558  NA 134* 137 137  K 4.6 4.8 3.8  CL 115* 109 110  CO2 22 24 22   BUN 9 6 6   CREATININE 0.71 0.62 0.62  CALCIUM 8.9 9.6 9.1  MG  --  1.2*  --   GLUCOSE 126* 158* 149*    CBG (last 3)   Recent Labs  12/02/14 2120 12/03/14 0634 12/03/14 1116  GLUCAP 243* 125* 86    Scheduled Meds: . atorvastatin  20 mg Oral Daily  . collagenase  1 application Topical Daily  . feeding supplement (GLUCERNA SHAKE)  237 mL Oral TID BM  . heparin  5,000 Units Subcutaneous 3 times per day  . insulin aspart  0-9 Units Subcutaneous TID WC  . insulin glargine  18 Units Subcutaneous QHS  . meropenem (MERREM) IV  1 g Intravenous 3 times per day  . pantoprazole  40 mg Oral Daily  . sodium chloride  3 mL Intravenous Q12H  . vancomycin  1,250 mg Intravenous Q24H    Continuous Infusions: . sodium chloride 20 mL/hr at 12/03/14 1300    Past Medical History  Diagnosis Date  . GERD (gastroesophageal reflux disease)   . Asthma   . Hx MRSA infection     on  face  . Gastroparesis   . Diabetic neuropathy   . Seizures   . Family history of anesthesia complication     Pt mother can't have epidural procedures  . Dysrhythmia   . Pneumonia   . Arthritis   . Fibromyalgia   . 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 Type I diabetes mellitus     sees Dr. Loanne Drilling     Past Surgical History  Procedure Laterality Date  . Tonsillectomy    . Multiple extractions with alveoloplasty N/A 08/03/2014    Procedure: MULTIPLE EXTRACTIONS;  Surgeon: Gae Bon, DDS;  Location: [place];  Service: Oral Surgery;  Laterality: N/A;  . Tee without cardioversion N/A 08/17/2014    Procedure: TRANSESOPHAGEAL ECHOCARDIOGRAM (TEE);  Surgeon: Dorothy Spark, MD;  Location: [facility] ENDOSCOPY;  Service: Cardiovascular;  Laterality: N/A;  . Debridment of decubitus ulcer N/A 10/04/2014    Procedure: DEBRIDMENT OF DECUBITUS ULCER;  Surgeon: Georganna Skeans, MD;  Location: [place];  Service: General;  Laterality: N/A;  . Laparoscopic diverted colostomy N/A 10/12/2014    Procedure: LAPAROSCOPIC DIVERTING COLOSTOMY;  Surgeon: Donnie Mesa, MD;  Location: [place];  Service: General;  Laterality: N/A;  . Insertion of suprapubic catheter N/A 10/12/2014    Procedure: INSERTION OF SUPRAPUBIC CATHETER;  Surgeon: Reece Packer, MD;  Location: [place];  Service: Urology;  Laterality: N/A;  . Incision and drainage of wound N/A 11/12/2014    Procedure: IRRIGATION AND DEBRIDEMENT OF WOUNDS WITH BONE BIOPSY AND SURGICAL PREP ;  Surgeon: Theodoro Kos, DO;  Location: [place];  Service: Plastics;  Laterality: N/A;  . Application of a-cell of back N/A 11/12/2014    Procedure: APPLICATION A CELL AND VAC ;  Surgeon: Theodoro Kos, DO;  Location: [place];  Service: Plastics;  Laterality: N/A;  . Incision and drainage of wound N/A 11/18/2014    Procedure: IRRIGATION AND DEBRIDEMENT OF SACRAL ULCER ONLY WITH PLACEMENT OF A CELL AND VAC/ DRESSING CHANGE TO UPPER BACK AREA.;  Surgeon: Theodoro Kos, DO;  Location: [place];  Service: Plastics;  Laterality: N/A;  . Incision and drainage of wound N/A 11/25/2014    Procedure: IRRIGATION AND DEBRIDEMENT OF SACRAL ULCER AND BACK BURN WITH PLACEMENT OF A-CELL;  Surgeon: Theodoro Kos, DO;  Location: [place];  Service: Plastics;  Laterality: N/A;  . Minor application of wound vac N/A 11/25/2014    Procedure:  WOUND VAC CHANGE;  Surgeon: Theodoro Kos, DO;  Location: [place];  Service: Plastics;  Laterality: N/A;    Kallie Locks, MS, RD, LDN Pager # [phone] After hours/ weekend pager # [phone]

## 2014-12-03 NOTE — Consult Note (Signed)
NEURO HOSPITALIST CONSULT NOTE    Reason for Consult: AMS and hallucinations.   HPI:                                                                                                                                          Jason [name] is an 29 y.o. 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  Patient was recently hospitalized from 2/1 to 2/12. He was treated for ischial osteomyelitis, pneumonia, Klebsiella bacteremia, and [place] UTI in hospital. Patient was discharged on IV vancomycin plus ertapenem, which are supposed to be continued for total of 6 weeks per ID. Per notes, Patient has been doing okay until 3 days ago when he started hallucinating. It seems that he is on the phone talking to somebody who is not there. Mother reports pt has not been sleeping at all in the past 3 days. Mother states that patient had hallucination 3 months ago, which was likely caused by morphine, since his symptoms resolved after discontinuation of morphine. This time, mother discontinued patient's Percocet, Lyrica and baclofen without any help. Patient currently on Vancomycin and Meropenem for his recent positive blood cultures of Klebsiella.   Patient was on Invanz for his osteomyelitis but this has been stopped due to SE including insomnia and confusion.   Patient is unable to provide good history. When confronted about the abnormal behavior, he stated "he always wears his blue tooth head phone and when he sleeps people often feel he is hallucinating".  In further discussion, he then told me he had coins in his hand (when in actuality there are no coins in his hand).   Neurology was consulted for recent hallucinations.   Blood cultures pending.   Past Medical History  Diagnosis Date  . GERD (gastroesophageal reflux disease)   . Asthma   . Hx MRSA infection     on face  . Gastroparesis   . Diabetic neuropathy   . Seizures   . Family history of anesthesia  complication     Pt mother can't have epidural procedures  . Dysrhythmia   . Pneumonia   . Arthritis   . Fibromyalgia   . 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 Type I diabetes mellitus     sees Dr. Loanne Drilling     Past Surgical History  Procedure Laterality Date  . Tonsillectomy    . Multiple extractions with alveoloplasty N/A 08/03/2014    Procedure: MULTIPLE EXTRACTIONS;  Surgeon: Gae Bon, DDS;  Location: [facility];  Service: Oral Surgery;  Laterality: N/A;  . Tee without cardioversion N/A 08/17/2014    Procedure: TRANSESOPHAGEAL ECHOCARDIOGRAM (TEE);  Surgeon: Dorothy Spark, MD;  Location: [facility];  Service: Cardiovascular;  Laterality: N/A;  .  Debridment of decubitus ulcer N/A 10/04/2014    Procedure: DEBRIDMENT OF DECUBITUS ULCER;  Surgeon: Georganna Skeans, MD;  Location: [facility];  Service: General;  Laterality: N/A;  . Laparoscopic diverted colostomy N/A 10/12/2014    Procedure: LAPAROSCOPIC DIVERTING COLOSTOMY;  Surgeon: Donnie Mesa, MD;  Location: [facility];  Service: General;  Laterality: N/A;  . Insertion of suprapubic catheter N/A 10/12/2014    Procedure: INSERTION OF SUPRAPUBIC CATHETER;  Surgeon: Reece Packer, MD;  Location: [facility];  Service: Urology;  Laterality: N/A;  . Incision and drainage of wound N/A 11/12/2014    Procedure: IRRIGATION AND DEBRIDEMENT OF WOUNDS WITH BONE BIOPSY AND SURGICAL PREP ;  Surgeon: Theodoro Kos, DO;  Location: [facility];  Service: Plastics;  Laterality: N/A;  . Application of a-cell of back N/A 11/12/2014    Procedure: APPLICATION A CELL AND VAC ;  Surgeon: Theodoro Kos, DO;  Location: [facility];  Service: Plastics;  Laterality: N/A;  . Incision and drainage of wound N/A 11/18/2014    Procedure: IRRIGATION AND DEBRIDEMENT OF SACRAL ULCER ONLY WITH PLACEMENT OF A CELL AND VAC/ DRESSING CHANGE TO UPPER BACK AREA.;  Surgeon: Theodoro Kos, DO;  Location: [facility];  Service: Plastics;  Laterality: N/A;  . Incision and drainage of  wound N/A 11/25/2014    Procedure: IRRIGATION AND DEBRIDEMENT OF SACRAL ULCER AND BACK BURN WITH PLACEMENT OF A-CELL;  Surgeon: Theodoro Kos, DO;  Location: [facility];  Service: Plastics;  Laterality: N/A;  . Minor application of wound vac N/A 11/25/2014    Procedure:  WOUND VAC CHANGE;  Surgeon: Theodoro Kos, DO;  Location: [facility];  Service: Plastics;  Laterality: N/A;    Family History  Problem Relation Age of Onset  . Diabetes Father   . Hypertension Father   . Asthma      fhx  . Hypertension      fhx  . Stroke      fhx  . Heart disease Mother     Social History:  reports that he quit smoking about 2 months ago. His smoking use included Cigars and Cigarettes. He quit after 12 years of use. He has never used smokeless tobacco. He reports that he does not drink alcohol or use illicit drugs.  Allergies  Allergen Reactions  . Cefuroxime Axetil Anaphylaxis  . Ertapenem Other (See Comments)    Rash and confusion  . Morphine And Related Other (See Comments)    Changed mental status, confusion, headache, visual hallucination  . Penicillins Anaphylaxis and Other (See Comments)    ?can take amoxicillin?  . Sulfa Antibiotics Anaphylaxis, Shortness Of Breath and Other (See Comments)  . Tessalon [Benzonatate] Anaphylaxis  . Shellfish Allergy Itching and Other (See Comments)    Took benadryl to alleviate reaction    MEDICATIONS:                                                                                                                     Prior to Admission:  Prescriptions  prior to admission  Medication Sig Dispense Refill Last Dose  . acetaminophen (TYLENOL) 500 MG tablet Take 1,000 mg by mouth every 6 (six) hours as needed for headache.     . albuterol (PROVENTIL) (2.5 MG/3ML) 0.083% nebulizer solution Take 3 mLs (2.5 mg total) by nebulization every 4 (four) hours as needed for wheezing or shortness of breath. 75 mL 3 Past Month at Unknown time  . diclofenac sodium (VOLTAREN) 1 % GEL  Apply 2 g topically daily as needed (for pain). 100 g 0 Past Month at Unknown time  . diphenoxylate-atropine (LOMOTIL) 2.5-0.025 MG per tablet Take 1 tablet by mouth 4 (four) times daily as needed for diarrhea or loose stools.   12/01/2014  . fentaNYL (DURAGESIC - DOSED MCG/HR) 25 MCG/HR patch Place 1 patch (25 mcg total) onto the skin every 3 (three) days. 10 patch 0 12/01/2014 at Unknown time  . insulin aspart (NOVOLOG) 100 UNIT/ML injection Inject 4 Units into the skin 3 (three) times daily before meals. (Patient taking differently: Inject 5 Units into the skin 4 (four) times daily. ) 10 mL 0 12/01/2014 at Unknown time  . lidocaine (XYLOCAINE) 2 % jelly Apply 1 application topically 3 (three) times a week.   Past Month at Unknown time  . lidocaine (XYLOCAINE) 5 % ointment Apply 1 application topically as needed (mouth sores).   Past Month at Unknown time  . midodrine (PROAMATINE) 5 MG tablet Take 5 mg by mouth as needed (Systolic blood pressure <79).     . mupirocin ointment (BACTROBAN) 2 % Place 1 application into the nose 3 (three) times a week.   11/29/2014  . ondansetron (ZOFRAN) 4 MG tablet Take 4 mg by mouth every 8 (eight) hours as needed for nausea or vomiting.   Past Month at Unknown time  . Protein POWD Take 1 scoop by mouth 2 (two) times daily. Add whey powder to yogurt as a protein supplement.   12/01/2014 at Unknown time  . sodium chloride (OCEAN) 0.65 % SOLN nasal spray Place 1 spray into both nostrils as needed for congestion.   Past Month at Unknown time  . vancomycin (VANCOCIN) 1 GM/200ML SOLN Inject 200 mLs (1,000 mg total) into the vein daily. 7000 mL 0 12/01/2014 at Unknown time  . atorvastatin (LIPITOR) 20 MG tablet Take 20 mg by mouth daily.   11/30/2014  . baclofen (LIORESAL) 10 MG tablet Take 1-2 tablets (10-20 mg total) by mouth 3 (three) times daily. Takes 10mg  in morning and lunchtime and takes 20mg  at bedtime 30 each 0 11/30/2014  . collagenase (SANTYL) ointment Apply 1  application topically daily. 90 g 5 11/29/2014  . dexlansoprazole (DEXILANT) 60 MG capsule Take 1 capsule (60 mg total) by mouth daily. 30 capsule 0 12/01/2014  . feeding supplement, GLUCERNA SHAKE, (GLUCERNA SHAKE) LIQD Take 237 mLs by mouth 3 (three) times daily between meals. (Patient taking differently: Take 237 mLs by mouth 3 (three) times daily with meals. ) 237 mL 90 11/30/2014  . glucagon (GLUCAGON EMERGENCY) 1 MG injection Inject 1 mg into the vein once as needed. (Patient taking differently: Inject 1 mg into the vein once as needed (hypoglycemia). ) 1 each 12 Taking  . insulin glargine (LANTUS) 100 UNIT/ML injection Inject 0.25 mLs (25 Units total) into the skin at bedtime. 10 mL 0 11/30/2014  . loratadine (CLARITIN) 10 MG tablet Take 10 mg by mouth daily.    11/30/2014  . oxyCODONE-acetaminophen (PERCOCET) 10-325 MG per tablet Take 1 tablet by  mouth every 8 (eight) hours. 90 tablet 0 11/30/2014  . potassium chloride SA (KLOR-CON M20) 20 MEQ tablet Take 1 tablet (20 mEq total) by mouth daily. 90 tablet 3 11/30/2014  . pregabalin (LYRICA) 100 MG capsule Take 1-2 capsules (100-200 mg total) by mouth 4 (four) times daily. Take 100 mg by mouth in the morning, take 100 mg by mouth in the afternoon, take 100 mg by mouth in the evening and take 200 mg by mouth at bedtime. 150 capsule 4 11/30/2014   Scheduled: . atorvastatin  20 mg Oral Daily  . collagenase  1 application Topical Daily  . feeding supplement (GLUCERNA SHAKE)  237 mL Oral TID BM  . heparin  5,000 Units Subcutaneous 3 times per day  . insulin aspart  0-9 Units Subcutaneous TID WC  . insulin glargine  18 Units Subcutaneous QHS  . meropenem (MERREM) IV  1 g Intravenous 3 times per day  . pantoprazole  40 mg Oral Daily  . sodium chloride  3 mL Intravenous Q12H  . vancomycin  1,250 mg Intravenous Q24H   LOV:FIEPPIRJJ, diclofenac sodium, feeding supplement (GLUCERNA SHAKE), haloperidol lactate, hydrALAZINE, LORazepam, sodium chloride, sodium  chloride   ROS:                                                                                                                                       History obtained from the patient  General ROS: negative for - chills, fatigue, fever, night sweats, weight gain or weight loss Psychological ROS: negative for - behavioral disorder, hallucinations, memory difficulties, mood swings or suicidal ideation Ophthalmic ROS: negative for - blurry vision, double vision, eye pain or loss of vision ENT ROS: negative for - epistaxis, nasal discharge, oral lesions, sore throat, tinnitus or vertigo Allergy and Immunology ROS: negative for - hives or itchy/watery eyes Hematological and Lymphatic ROS: negative for - bleeding problems, bruising or swollen lymph nodes Endocrine ROS: negative for - galactorrhea, hair pattern changes, polydipsia/polyuria or temperature intolerance Respiratory ROS: negative for - cough, hemoptysis, shortness of breath or wheezing Cardiovascular ROS: negative for - chest pain, dyspnea on exertion, edema or irregular heartbeat Gastrointestinal ROS: negative for - abdominal pain, diarrhea, hematemesis, nausea/vomiting or stool incontinence Genito-Urinary ROS: negative for - dysuria, hematuria, incontinence or urinary frequency/urgency Musculoskeletal ROS: negative for - joint swelling or muscular weakness Neurological ROS: as noted in HPI Dermatological ROS: negative for rash and skin lesion changes   Blood pressure 185/84, pulse 91, temperature 98.7 F (37.1 C), temperature source Oral, resp. rate 16, height 5\' 8"  (1.727 m), weight 51.256 kg (113 lb), SpO2 100 %.   Neurologic Examination:  HEENT-  Normocephalic, no lesions, without obvious abnormality.  Normal external eye and conjunctiva.  Normal TM's bilaterally.  Normal auditory canals and external ears. Normal external nose,  mucus membranes and septum.  Normal pharynx. Cardiovascular- S1, S2 normal, pulses palpable throughout   Lungs- chest clear, no wheezing, rales, normal symmetric air entry Abdomen- normal findings: bowel sounds normal Extremities- no joint deformities, effusion, or inflammation Lymph-no adenopathy palpable Musculoskeletal-no joint tenderness, deformity or swelling Skin-warm and dry, no hyperpigmentation, vitiligo, or suspicious lesions  Neurological Examination Mental Status: Alert, oriented to hospital, year, month and date. He is aware he is in the hospital for infection but then will tell me he would like to give me coins that are in his hand (he has no actual coins in his hand).  Speech fluent without evidence of aphasia.  Able to follow simple commands such as squeezing hands, lifting arms, facial movements.. Cranial Nerves: II: Visual fields grossly normal, pupils equal, round, reactive to light and accommodation III,IV, VI: ptosis not present, extra-ocular motions intact bilaterally V,VII: smile symmetric, facial light touch sensation normal bilaterally VIII: hearing normal bilaterally IX,X: gag reflex present XI: bilateral shoulder shrug XII: midline tongue extension Motor: Moving bilateral arms antigravity with spastic movements.  He holds both arms in flexion, however he shows no significant increased tone and I am able to extend both arms with no problem. HE has no movement of his LE.  Sensory: Pinprick and light touch intact throughout bilateral UE. LE was inconsistent.  Deep Tendon Reflexes: 2+ and symmetric throughout UE no LE KJ or AJ Plantars: mute Cerebellar: Spastic movements with finger-to-nose bilaterally, unable to obtain heel-to-shin test Gait: unable to obtain      Lab Results: Basic Metabolic Panel:  Recent Labs Lab 12/01/14 1605 12/02/14 0410 12/03/14 0558  NA 134* 137 137  K 4.6 4.8 3.8  CL 115* 109 110  CO2 22 24 22   GLUCOSE 126* 158* 149*  BUN  9 6 6   CREATININE 0.71 0.62 0.62  CALCIUM 8.9 9.6 9.1  MG  --  1.2*  --     Liver Function Tests:  Recent Labs Lab 12/01/14 1605 12/02/14 0410  AST 25 23  ALT 19 18  ALKPHOS 128* 122*  BILITOT 0.4 0.6  PROT 7.8 7.6  ALBUMIN 2.7* 2.4*   No results for input(s): LIPASE, AMYLASE in the last 168 hours.  Recent Labs Lab 12/01/14 2008  AMMONIA 17    CBC:  Recent Labs Lab 12/01/14 1605 12/02/14 0410 12/03/14 0558  WBC 13.0* 13.6* 14.2*  HGB 8.8* 8.6* 8.0*  HCT 27.7* 26.9* 25.2*  MCV 84.5 82.0 81.6  PLT 640* 621* 540*    Cardiac Enzymes: No results for input(s): CKTOTAL, CKMB, CKMBINDEX, TROPONINI in the last 168 hours.  Lipid Panel: No results for input(s): CHOL, TRIG, HDL, CHOLHDL, VLDL, LDLCALC in the last 168 hours.  CBG:  Recent Labs Lab 12/02/14 1108 12/02/14 1621 12/02/14 2120 12/03/14 0634 12/03/14 1116  GLUCAP 192* 204* 243* 125* 16    Microbiology: Results for orders placed or performed during the hospital encounter of 12/01/14  Blood culture (routine x 2)     Status: None (Preliminary result)   Collection Time: 12/01/14  4:05 PM  Result Value Ref Range Status   Specimen Description BLOOD  LAC  Final   Special Requests BOTTLES DRAWN AEROBIC AND ANAEROBIC 5ML EACH  Final   Culture   Final           BLOOD CULTURE  RECEIVED NO GROWTH TO DATE CULTURE WILL BE HELD FOR 5 DAYS BEFORE ISSUING A FINAL NEGATIVE REPORT Performed at [facility]    Report Status PENDING  Incomplete  Urine culture     Status: None   Collection Time: 12/02/14  2:25 AM  Result Value Ref Range Status   Specimen Description URINE, CATHETERIZED  Final   Special Requests NONE  Final   Colony Count NO GROWTH Performed at [facility]   Final   Culture NO GROWTH Performed at [facility]   Final   Report Status 12/03/2014 FINAL  Final    Coagulation Studies:  Recent Labs  12/02/14 0410  LABPROT 14.3  INR 1.10    Imaging: Ct Head Wo  Contrast  12/01/2014   CLINICAL DATA:  Allergic reaction to pain meds withdrawal. Pt has had hallucinations and altered mental status x 3 days per mom  EXAM: CT HEAD WITHOUT CONTRAST  TECHNIQUE: Contiguous axial images were obtained from the base of the skull through the vertex without intravenous contrast.  COMPARISON:  Head CT 07/29/2014, MRI 11 07/2014  FINDINGS: No acute intracranial hemorrhage. No focal mass lesion. No CT evidence of acute infarction. No midline shift or mass effect. No hydrocephalus. Basilar cisterns are patent. Paranasal sinuses and mastoid air cells are clear.  IMPRESSION: History no acute   Electronically Signed   By: Suzy Bouchard M.D.   On: 12/01/2014 18:32   Dg Chest Port 1 View  12/02/2014   CLINICAL DATA:  Shortness of breath.  PICC in place.  EXAM: PORTABLE CHEST - 1 VIEW  COMPARISON:  11/19/2014  FINDINGS: Tip of the right upper extremity PICC in the mid-distal SVC. Cardiomediastinal contours are normal. Decreased left basilar airspace disease with minimal residual linear opacities. The lungs are otherwise clear. There is no pleural effusion or pneumothorax. Unchanged appearance of the right distal clavicle with probable posttraumatic deformity.  IMPRESSION: 1. Tip of the right upper extremity PICC in the mid distal SVC. 2. Improved left basilar aeration with minimal residual linear opacity, likely atelectasis.   Electronically Signed   By: Jeb Levering M.D.   On: 12/02/2014 03:28     Etta Quill PA-C [facility] [phone]  12/03/2014, 4:18 PM    Patient seen and examined.  Clinical course and management discussed.  Necessary edits performed.  I agree with the above.  Assessment and plan of care developed and discussed below.      Assessment/Plan: 29 year old quadriplegic with altered mental status.  Has been seen in the past with the same complaints.  This has been in the setting of infection as well.  Patient again with infection,  osteomyelitis, pneumonia, bacteremia, UTI.  White blood cell count remains elevated.  Patient on Vancomycin and Meropenem.  Along with infection meropenem has been shown to have side effects of mental status change as well.    Recommendations: 1.  Would discuss antibiotic options with ID  2.  Agree with continued treatment of infection.   3.  Will continue to follow with you   Alexis Goodell, MD [facility] [phone]  12/03/2014  6:42 PM

## 2014-12-04 ENCOUNTER — Inpatient Hospital Stay (HOSPITAL_COMMUNITY): Payer: Medicaid Other

## 2014-12-04 DIAGNOSIS — F29 Unspecified psychosis not due to a substance or known physiological condition: Secondary | ICD-10-CM

## 2014-12-04 DIAGNOSIS — G4700 Insomnia, unspecified: Secondary | ICD-10-CM

## 2014-12-04 LAB — GLUCOSE, CAPILLARY
GLUCOSE-CAPILLARY: 193 mg/dL — AB (ref 70–99)
GLUCOSE-CAPILLARY: 364 mg/dL — AB (ref 70–99)
Glucose-Capillary: 311 mg/dL — ABNORMAL HIGH (ref 70–99)
Glucose-Capillary: 180 mg/dL — ABNORMAL HIGH (ref 70–99)

## 2014-12-04 MED ORDER — METRONIDAZOLE IN NACL 5-0.79 MG/ML-% IV SOLN
500.0000 mg | Freq: Three times a day (TID) | INTRAVENOUS | Status: DC
  Administered 2014-12-04 – 2014-12-09 (×15): 500 mg via INTRAVENOUS
  Filled 2014-12-04 (×19): qty 100

## 2014-12-04 MED ORDER — OXYCODONE-ACETAMINOPHEN 5-325 MG PO TABS
1.0000 | ORAL_TABLET | Freq: Four times a day (QID) | ORAL | Status: DC | PRN
  Administered 2014-12-05 – 2014-12-06 (×5): 1 via ORAL
  Filled 2014-12-04 (×6): qty 1

## 2014-12-04 MED ORDER — MIRTAZAPINE 15 MG PO TABS
15.0000 mg | ORAL_TABLET | Freq: Every day | ORAL | Status: DC
  Administered 2014-12-04 – 2014-12-23 (×17): 15 mg via ORAL
  Filled 2014-12-04 (×20): qty 1

## 2014-12-04 MED ORDER — HYDROCODONE-ACETAMINOPHEN 5-325 MG PO TABS: 1.0000 | ORAL_TABLET | Freq: Four times a day (QID) | ORAL | Status: DC | PRN

## 2014-12-04 MED ORDER — LEVOFLOXACIN IN D5W 500 MG/100ML IV SOLN
500.0000 mg | INTRAVENOUS | Status: DC
  Administered 2014-12-04 – 2014-12-11 (×7): 500 mg via INTRAVENOUS
  Filled 2014-12-04 (×10): qty 100

## 2014-12-04 MED ORDER — QUETIAPINE FUMARATE 25 MG PO TABS
25.0000 mg | ORAL_TABLET | Freq: Three times a day (TID) | ORAL | Status: DC
  Administered 2014-12-04 – 2014-12-12 (×21): 25 mg via ORAL
  Filled 2014-12-04 (×29): qty 1

## 2014-12-04 NOTE — Progress Notes (Signed)
Routine EEG completed, results pending. 

## 2014-12-04 NOTE — Procedures (Signed)
ELECTROENCEPHALOGRAM REPORT   Patient: Jason [name]       Room #: 5N30 EEG No. ID: [id number] Age: 29 y.o.        Sex: male Referring Physician: Darrick Meigs Report Date:  12/04/2014        Interpreting Physician: Alexis Goodell  History: [name] is an 29 y.o. male with altered mental status  Medications:  Scheduled: . atorvastatin  20 mg Oral Daily  . collagenase  1 application Topical Daily  . feeding supplement (GLUCERNA SHAKE)  237 mL Oral TID BM  . heparin  5,000 Units Subcutaneous 3 times per day  . insulin aspart  0-9 Units Subcutaneous TID WC  . insulin glargine  18 Units Subcutaneous QHS  . levofloxacin (LEVAQUIN) IV  500 mg Intravenous Q24H  . metronidazole  500 mg Intravenous Q8H  . mirtazapine  15 mg Oral QHS  . pantoprazole  40 mg Oral Daily  . QUEtiapine  25 mg Oral TID  . sodium chloride  3 mL Intravenous Q12H  . vancomycin  1,250 mg Intravenous Q24H    Conditions of Recording:  This is a 16 channel EEG carried out with the patient in the awake and drowsy states.  Description:  During wakefulness the background activity is dominated by artifact.  There are brief periods when the background activity can be evaluated and consists of a posterior background rhythm that is a low voltage, symmetrical, fairly well organized, 10 Hz alpha activity, seen from the parieto-occipital and posterior temporal regions.  Low voltage fast activity, poorly organized, is seen anteriorly and is at times superimposed on more posterior regions.  A mixture of theta and alpha rhythms are seen from the central and temporal regions. There are other periods when the patient appears to drowse.  Artifact is less prominent during these times and there is a slowing of the background rhythm and the appearance of a diffusely distributed polymorphic delta activity.  Stage II sleep was not obtained. Hyperventilation and intermittent photic stimulation were not performed.   IMPRESSION: This is a  normal awake and drowsy electroencephalogram.  No epileptiform activity is noted.     Alexis Goodell, MD [facility] [phone] 12/04/2014, 4:44 PM

## 2014-12-04 NOTE — Progress Notes (Addendum)
[facility] PROGRESS NOTE  JOEANGEL JEANPAUL PIR:[id number] DOB: 06-Nov-1985 DOA: 12/01/2014 PCP: [name], MD  Assessment/Plan: 1. Altered mental status- patient has complicated history with pneumonia ischial osteomyelitis, Klebsiella bacteremia and Burkhoderia UTI. Infection seems to be controlled with IV antibiotics. [name] has now been stopped, which can supposedly cause altered mental status. EEG has been ordered. Neurology again recommended to change the meropenem to some other antibiotic. I called and discussed with infectious disease Dr. Michel Bickers, who recommends to stop meropenem and start Flagyl as well as Levaquin along with vancomycin. He is 2. Hallucinations - Will obtain psych consultation for starting medication for hallucinations . 3. 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 It showed multiple food collections within the abductor magnus and abductor brevis muscles likely consistent with intramuscular abscesses. Patient was seen by plastic surgery underwent debridement on 11/12/2014 11/18/2014 and 11/25/2014 with VAC placement. Patient started on vancomycin and ertapenem as per infectious disease now the ertapenem was changed to meropenem. Blood culture urine culture has been collected. We'll follow the culture results, continue vancomycin, Flagyl, Levaquin. Will discontinue meropenem. 4. Klebsiella pneumonia bacteremia- blood culture  was positive for Klebsiella pneumonia, currently patient on meropenem and vancomycin 5. Burkholderia UTI- patient has been treated for UTI. No need to treat with more antibiotics as per ID. 6. Type 1 diabetes mellitus- Lantus 18 units, does decrease from 25 units due to poor by mouth intake. Continue sliding scale insulin 7. Functional quadriplegia, neurogenic bladder- bedbound, secondary to spinal cord infarction. 8. DVT prophylaxis- heparin  Code  Status: Full code Family Communication: *Spoke to patient's mother at bedside Disposition Plan: Home when stable   Consultants:  None  Procedures:  *None  Antibiotics:  Vancomycin  Meropenem  HPI/Subjective: 29 y.o. 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 Patient was recently hospitalized from 2/1 to 2/12. He was treated for ischial osteomyelitis, pneumonia, Klebsiella bacteremia, and Burkhoderia UTI in hospital. Patient was discharged on IV vancomycin plus ertapenem, which are supposed to be continued for total of 6 weeks per ID. Patient has been doing okay until 3 days ago when he started hallucinating. It seems that he is on the phone talking to somebody who is not there. Mother reports pt has not been sleeping at all in the past 3 days. Mother states that patient had hallucination 3 months ago, which was likely caused by morphine, since his symptoms resolved after discontinuation of morphine. This time, mother discontinued patient's Percocet, Lyrica and baclofen without any help. His mother states that patient developed new rashes over his forehead and upper face. He has back pain. No headache, neck rigidity or photophobia. Patient denies fever, chills, cough, chest pain, SOB, nausea, vomiting, abdominal pain, diarrhea, dysuria, urgency, frequency, hematuria or leg swelling.  In ED, patient was found to have negative CT head for acute abnormalities. Leukocytosis with WBC 13.0, negative UDS, lactate 1.06, positive urinalysis for moderate leukocytes, negative salicylate and acetaminophen level, ammonia 17, temperature normal. Electrolytes okay. Patient's admitted to inpatient for further evaluation and treatment.  Patient seen and examined, still having anxiety,  hallucinations.  Objective: Filed Vitals:   12/04/14 0629  BP: 107/64  Pulse: 109  Temp: 97.5 F (36.4 C)  Resp: 18    Intake/Output Summary (Last 24 hours) at  12/04/14 1104 Last data filed at 12/04/14 9381  Gross per 24 hour  Intake    700 ml  Output   6225 ml  Net  -5525 ml   Filed Weights   12/02/14 0646 12/03/14 0500 12/04/14 0500  Weight: 56.836 kg (125 lb 4.8 oz) 51.256 kg (113 lb) 54.432 kg (120 lb)    Exam:   General:  *Appear in no acute distress  Cardiovascular: S1-S2 regular  Respiratory: Clear bilaterally  Abdomen: Soft nontender no organomegaly  Musculoskeletal: No edema    Data Reviewed: Basic Metabolic Panel:  Recent Labs Lab 12/01/14 1605 12/02/14 0410 12/03/14 0558  NA 134* 137 137  K 4.6 4.8 3.8  CL 115* 109 110  CO2 22 24 22   GLUCOSE 126* 158* 149*  BUN 9 6 6   CREATININE 0.71 0.62 0.62  CALCIUM 8.9 9.6 9.1  MG  --  1.2*  --    Liver Function Tests:  Recent Labs Lab 12/01/14 1605 12/02/14 0410  AST 25 23  ALT 19 18  ALKPHOS 128* 122*  BILITOT 0.4 0.6  PROT 7.8 7.6  ALBUMIN 2.7* 2.4*   No results for input(s): LIPASE, AMYLASE in the last 168 hours.  Recent Labs Lab 12/01/14 2008  AMMONIA 17   CBC:  Recent Labs Lab 12/01/14 1605 12/02/14 0410 12/03/14 0558  WBC 13.0* 13.6* 14.2*  HGB 8.8* 8.6* 8.0*  HCT 27.7* 26.9* 25.2*  MCV 84.5 82.0 81.6  PLT 640* 621* 540*   Cardiac Enzymes: No results for input(s): CKTOTAL, CKMB, CKMBINDEX, TROPONINI in the last 168 hours. BNP (last 3 results) No results for input(s): BNP in the last 8760 hours.  ProBNP (last 3 results)  Recent Labs  01/29/14 2113  PROBNP 1343.0*    CBG:  Recent Labs Lab 12/03/14 0634 12/03/14 1116 12/03/14 1637 12/03/14 2121 12/04/14 0634  GLUCAP 125* 86 295* 361* 311*    Recent Results (from the past 240 hour(s))  Blood culture (routine x 2)     Status: None (Preliminary result)   Collection Time: 12/01/14  4:05 PM  Result Value  Ref Range Status   Specimen Description BLOOD  LAC  Final   Special Requests BOTTLES DRAWN AEROBIC AND ANAEROBIC 5ML EACH  Final   Culture   Final           BLOOD CULTURE RECEIVED NO GROWTH TO DATE CULTURE WILL BE HELD FOR 5 DAYS BEFORE ISSUING A FINAL NEGATIVE REPORT Performed at [facility]    Report Status PENDING  Incomplete  Urine culture     Status: None   Collection Time: 12/02/14  2:25 AM  Result Value Ref Range Status   Specimen Description URINE, CATHETERIZED  Final   Special Requests NONE  Final   Colony Count NO GROWTH Performed at [facility]   Final   Culture NO GROWTH Performed at [facility]   Final   Report Status 12/03/2014 FINAL  Final     Studies: No results found.  Scheduled Meds: . atorvastatin  20 mg Oral Daily  . collagenase  1 application Topical Daily  . feeding supplement (GLUCERNA SHAKE)  237 mL Oral TID BM  . heparin  5,000 Units Subcutaneous 3 times per day  . insulin aspart  0-9 Units Subcutaneous TID WC  . insulin glargine  18 Units Subcutaneous QHS  . meropenem (MERREM) IV  1 g Intravenous 3 times per day  . pantoprazole  40 mg Oral Daily  . sodium chloride  3 mL Intravenous Q12H  . vancomycin  1,250 mg Intravenous Q24H   Continuous Infusions: . sodium chloride 150 mL/hr at 12/03/14 2049    Principal Problem:   Acute encephalopathy Active Problems:   Type 1 diabetes, uncontrolled, with neuropathy   Asthma   GERD   Gastroparesis due to DM   Protein-calorie malnutrition, severe   Spinal cord infarction (history of)   Tetraplegia   Neurogenic bladder   UTI (urinary tract infection)   Sacral pressure sore   Stage IV decubitus ulcer   Osteomyelitis, pelvis   Anemia   Burkholderia cepacia infection   Disorientation    Time spent: 20 minutes    [facility] Pager [phone]. If 7PM-7AM, please contact night-coverage at [email or web address], password [facility] 12/04/2014, 11:04 AM  LOS: 3  days

## 2014-12-04 NOTE — Progress Notes (Signed)
  Subjective: The patient is resting in bed and mom is at bedside.  He is very confused but answers to his name.    Objective: Vital signs in last 24 hours: Temp:  [97.5 F (36.4 C)-98.7 F (37.1 C)] 97.5 F (36.4 C) (02/27 0629) Pulse Rate:  [78-109] 109 (02/27 0629) Resp:  [16-18] 18 (02/27 0629) BP: (107-185)/(64-88) 107/64 mmHg (02/27 0629) SpO2:  [100 %] 100 % (02/27 0629) Weight:  [54.432 kg (120 lb)] 54.432 kg (120 lb) (02/27 0500) Last BM Date: 12/03/14  Intake/Output from previous day: 02/26 0701 - 02/27 0700 In: 1190 [P.O.:840; IV Piggyback:350] Out: 1610 [Urine:3350; Stool:2875] Intake/Output this shift:    No acute distress but is confused. VAC in place   Lab Results:   Recent Labs  12/02/14 0410 12/03/14 0558  WBC 13.6* 14.2*  HGB 8.6* 8.0*  HCT 26.9* 25.2*  PLT 621* 540*   BMET  Recent Labs  12/02/14 0410 12/03/14 0558  NA 137 137  K 4.8 3.8  CL 109 110  CO2 24 22  GLUCOSE 158* 149*  BUN 6 6  CREATININE 0.62 0.62  CALCIUM 9.6 9.1   PT/INR  Recent Labs  12/02/14 0410  LABPROT 14.3  INR 1.10   ABG  Recent Labs  12/01/14 1755  PHART 7.342*  HCO3 18.9*    Studies/Results: No results found.  Anti-infectives: Anti-infectives    Start     Dose/Rate Route Frequency Ordered Stop   12/03/14 0800  vancomycin (VANCOCIN) 1,250 mg in sodium chloride 0.9 % 250 mL IVPB     1,250 mg 166.7 mL/hr over 90 Minutes Intravenous Every 24 hours 12/02/14 1158     12/02/14 1130  vancomycin (VANCOCIN) IVPB 1000 mg/200 mL premix  Status:  Discontinued     1,000 mg 200 mL/hr over 60 Minutes Intravenous Every 24 hours 12/02/14 0119 12/02/14 1155   12/02/14 0300  meropenem (MERREM) 1 g in sodium chloride 0.9 % 100 mL IVPB     1 g 200 mL/hr over 30 Minutes Intravenous 3 times per day 12/02/14 0212        Assessment/Plan: s/p Procedure(s): IRRIGATION AND DEBRIDEMENT SACRAL WOUND AND RIGHT ISCHIAL WOUND  (Right) APPLICATION OF A-CELL AND WOUND  VAC  (N/A) Plan for return to OR next week for further Acell VAC placement.  LOS: 3 days    [facility] 12/04/2014

## 2014-12-04 NOTE — Consult Note (Signed)
[facility] Face-to-Face Psychiatry Consult   Reason for Consult:  Halllucinations Referring Physician:  Dr. Darrick Meigs Patient Identification: Jason [name] MRN:  [id number] Principal Diagnosis: Acute encephalopathy Diagnosis:   Patient Active Problem List   Diagnosis Date Noted  . Disorientation [R41.0] 12/01/2014  . Finger ulcer [L98.499]   . Pleural effusion [J90]   . Diabetic ulcer of left foot associated with diabetes mellitus due to underlying condition [E08.621, L97.529]   . Aspiration pneumonia [J69.0]   . Right hip joint effusion [M25.451]   . Klebsiella pneumoniae infection [B96.1]   . Decubitus ulcer of ischial area [L89.309]   . Sacral decubitus ulcer [L89.159]   . Gram-negative bacteremia [R78.81, A41.50]   . 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 Urinary tract infectious disease [N39.0]   . Suprapubic catheter [Z93.59]   . Osteomyelitis, pelvic region and thigh [M86.9]   . Septic hip [M00.9]   . Decubitus ulcer [L89.90]   . Anemia [D64.9] 10/22/2014  . AKI (acute kidney injury) [N17.9]   . Staphylococcus aureus bacteremia with sepsis [A41.01]   . Abscess [L02.91]   . Osteomyelitis, pelvis [M86.9]   . Cellulitis and abscess of leg, except foot [L02.419, L03.119]   . Blood poisoning [A41.9]   . Endotracheally intubated [Z78.9]   . Encounter for orogastric (OG) tube placement [Z46.59]   . Acute respiratory failure with hypoxia [J96.01] 09/17/2014  . Osteomyelitis [M86.9]   . Type 1 diabetes mellitus with hyperglycemia [E10.65] 08/22/2014  . Osteomyelitis hip [M86.9]   . 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 Bacteremia [R78.81]   . Severe sepsis [A41.9, R65.20]   . Staphylococcus aureus bacteremia [R78.81, B95.61] 08/14/2014  . Sacral pressure sore [L89.159] 08/14/2014  . Sepsis [A41.9] 08/13/2014  . Acute respiratory failure with hypercapnia [J96.02] 08/13/2014  . UTI (lower urinary tract infection) [N39.0]   . Healthcare-associated pneumonia [J18.9]   . Renal  failure (ARF), acute on chronic [N17.9, N18.9]   . Pneumonia [J18.9] 08/12/2014  . Elevated transaminase level [R74.0] 07/30/2014  . Hyperkalemia [E87.5] 07/29/2014  . Acute encephalopathy [G93.40] 07/18/2014  . UTI (urinary tract infection) [N39.0] 07/18/2014  . Fever [R50.9] 05/28/2014  . SIRS (systemic inflammatory response syndrome) [A41.9] 05/28/2014  . Hypoglycemia [E16.2] 05/28/2014  . Diarrhea [R19.7] 05/14/2014  . Tetraplegia [G82.50] 04/19/2014  . Neurogenic bladder [N31.9] 04/19/2014  . Spinal cord infarction (history of) [G95.19] 02/05/2014  . Diabetic foot ulcer [E11.621, L97.509] 02/04/2014  . IDDM (insulin dependent diabetes mellitus) [E11.9, Z79.4] 10/04/2013  . Protein-calorie malnutrition, severe [E43] 09/24/2013  . Gastroparesis due to DM [E11.43] 06/02/2013  . VIRAL URI [J06.9] 09/15/2007  . GERD [K21.9] 09/15/2007  . Type 1 diabetes, uncontrolled, with neuropathy [E10.41] 05/29/2007  . Asthma [J45.909] 05/29/2007    Total Time spent with patient: 45 minutes  Subjective:   Jason [name] is a 30 y.o. male patient admitted with hallucinations.  HPI:  Jason [name] is a 29 y.o. male seen, chart reviewed and case discussed with the Dr. Darrick Meigs and patient mother who is at bedside. Psychiatric consultation was called in for the evaluation of new onset psychosis including visual, auditory and tactile hallucinations since he was hospitalized and receiving antibiotic medication or infections. Patient mother reported patient has been suffering with severe anxiety, irritability and agitation constantly fighting in the a with his hands and could not sleep more than 5 days. Patient reported he is seeing dogs coming into his room and also snakes crawling on him and hearing dog barking. 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  Patient was recently hospitalized from 2/1 to 2/12. He was treated for ischial osteomyelitis, pneumonia, Klebsiella bacteremia, and [place] UTI in hospital. Patient was discharged on IV vancomycin plus ertapenem, which are supposed to be continued for total of 6 weeks per ID. Patient has been doing okay until 3 days ago when he started hallucinating. It seems that he is on the phone talking to somebody who is not there. Mother reports pt has not been sleeping at all in the past 3 days. Mother states that patient had hallucination 3 months ago, which was likely caused by morphine, since his symptoms resolved after discontinuation of morphine. This time, mother discontinued patient's Percocet, Lyrica and baclofen without any help. His mother states that patient developed new rashes over his forehead and upper face. He has back pain. No headache, neck rigidity or photophobia. Patient denies fever, chills, cough, chest pain, SOB, nausea, vomiting, abdominal pain, diarrhea, dysuria, urgency, frequency, hematuria or leg swelling.   In ED, patient was found to have negative CT head for acute abnormalities. Leukocytosis with WBC 13.0, negative UDS, lactate 1.06, positive urinalysis for moderate leukocytes, negative salicylate and acetaminophen level, ammonia 17, temperature normal. Electrolytes okay. Patient's admitted to inpatient for further evaluation and treatment.  Review of Systems: As presented in the history of presenting illness, rest negative  HPI Elements:   Location:  Auditory and visual hallucinations. Quality:  Poor. Severity:  Unable to sleep and constantly fighting in the a. Timing:  Infections and being sick. Duration:  Few weeks. Context:  Patient mom believes medication might be causing some hallucinations.  Past Medical  History:  Past Medical History  Diagnosis Date  . GERD (gastroesophageal reflux disease)   . Asthma   . Hx MRSA infection     on face  . Gastroparesis   . Diabetic neuropathy   . Seizures   . Family history of anesthesia complication     Pt mother can't have epidural procedures  . Dysrhythmia   . Pneumonia   . Arthritis   . Fibromyalgia   . 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 Type I diabetes mellitus     sees Dr. Loanne Drilling     Past Surgical History  Procedure Laterality Date  . Tonsillectomy    . Multiple extractions with alveoloplasty N/A 08/03/2014    Procedure: MULTIPLE EXTRACTIONS;  Surgeon: Gae Bon, DDS;  Location: [place];  Service: Oral Surgery;  Laterality: N/A;  . Tee without cardioversion N/A 08/17/2014    Procedure: TRANSESOPHAGEAL ECHOCARDIOGRAM (TEE);  Surgeon: Dorothy Spark, MD;  Location[facility];  Service: Cardiovascular;  Laterality: N/A;  . Debridment of decubitus ulcer N/A 10/04/2014    Procedure: DEBRIDMENT OF DECUBITUS ULCER;  Surgeon: Georganna Skeans, MD;  Location: [place];  Service: General;  Laterality: N/A;  . Laparoscopic diverted colostomy N/A 10/12/2014    Procedure: LAPAROSCOPIC DIVERTING COLOSTOMY;  Surgeon: Donnie Mesa, MD;  Location: [place];  Service: General;  Laterality: N/A;  . Insertion of suprapubic catheter N/A 10/12/2014    Procedure: INSERTION OF SUPRAPUBIC CATHETER;  Surgeon: Reece Packer, MD;  Location: [place];  Service: Urology;  Laterality: N/A;  .  Incision and drainage of wound N/A 11/12/2014    Procedure: IRRIGATION AND DEBRIDEMENT OF WOUNDS WITH BONE BIOPSY AND SURGICAL PREP ;  Surgeon: Theodoro Kos, DO;  Location: [place];  Service: Plastics;  Laterality: N/A;  . Application of a-cell of back N/A 11/12/2014    Procedure: APPLICATION A CELL AND VAC ;  Surgeon: Theodoro Kos, DO;  Location: [place];  Service: Plastics;  Laterality: N/A;  . Incision and drainage of wound N/A 11/18/2014    Procedure:  IRRIGATION AND DEBRIDEMENT OF SACRAL ULCER ONLY WITH PLACEMENT OF A CELL AND VAC/ DRESSING CHANGE TO UPPER BACK AREA.;  Surgeon: Theodoro Kos, DO;  Location: [place];  Service: Plastics;  Laterality: N/A;  . Incision and drainage of wound N/A 11/25/2014    Procedure: IRRIGATION AND DEBRIDEMENT OF SACRAL ULCER AND BACK BURN WITH PLACEMENT OF A-CELL;  Surgeon: Theodoro Kos, DO;  Location: [place];  Service: Plastics;  Laterality: N/A;  . Minor application of wound vac N/A 11/25/2014    Procedure:  WOUND VAC CHANGE;  Surgeon: Theodoro Kos, DO;  Location: [place];  Service: Plastics;  Laterality: N/A;   Family History:  Family History  Problem Relation Age of Onset  . Diabetes Father   . Hypertension Father   . Asthma      fhx  . Hypertension      fhx  . Stroke      fhx  . Heart disease Mother    Social History:  History  Alcohol Use No    Comment: rarely     History  Drug Use No    History   Social History  . Marital Status: Single    Spouse Name: N/A  . Number of Children: 0  . Years of Education: 11   Occupational History  . Disability    Social History Main Topics  . Smoking status: Former Smoker -- 12 years    Types: Cigars, Cigarettes    Quit date: 09/07/2014  . Smokeless tobacco: Never Used     Comment: e-cigarette and cigars currently  . Alcohol Use: No     Comment: rarely  . Drug Use: No  . Sexual Activity: Not Currently   Other Topics Concern  . None   Social History Narrative   Patient lives at home with mother father.    Patient has 2 children.    Patient is single.    Patient was left hand but after stroke patient is now right handed.    Patient has  11th grade education.    Additional Social History:                          Allergies:   Allergies  Allergen Reactions  . Cefuroxime Axetil Anaphylaxis  . Ertapenem Other (See Comments)    Rash and confusion  . Morphine And Related Other (See Comments)    Changed mental status,  confusion, headache, visual hallucination  . Penicillins Anaphylaxis and Other (See Comments)    ?can take amoxicillin?  . Sulfa Antibiotics Anaphylaxis, Shortness Of Breath and Other (See Comments)  . Tessalon [Benzonatate] Anaphylaxis  . Shellfish Allergy Itching and Other (See Comments)    Took benadryl to alleviate reaction    Vitals: Blood pressure 107/64, pulse 109, temperature 97.5 F (36.4 C), temperature source Oral, resp. rate 18, height 5\' 8"  (1.727 m), weight 54.432 kg (120 lb), SpO2 100 %.  Risk to Self: Is patient at risk for  suicide?: No Risk to Others:   Prior Inpatient Therapy:   Prior Outpatient Therapy:    Current Facility-Administered Medications  Medication Dose Route Frequency Provider Last Rate Last Dose  . 0.9 %  sodium chloride infusion   Intravenous Continuous Ivor Costa, MD 150 mL/hr at 12/03/14 2049    . albuterol (PROVENTIL) (2.5 MG/3ML) 0.083% nebulizer solution 2.5 mg  2.5 mg Nebulization Q4H PRN Ivor Costa, MD      . atorvastatin (LIPITOR) tablet 20 mg  20 mg Oral Daily Ivor Costa, MD   20 mg at 12/04/14 1135  . collagenase (SANTYL) ointment 1 application  1 application Topical Daily Ivor Costa, MD   1 application at 20/25/42 1500  . diclofenac sodium (VOLTAREN) 1 % transdermal gel 2 g  2 g Topical Daily PRN Ivor Costa, MD      . feeding supplement (GLUCERNA SHAKE) (GLUCERNA SHAKE) liquid 237 mL  237 mL Oral BID PRN Ivor Costa, MD   237 mL at 12/02/14 1236  . feeding supplement (GLUCERNA SHAKE) (GLUCERNA SHAKE) liquid 237 mL  237 mL Oral TID BM Kallie Locks, RD   237 mL at 12/04/14 1142  . haloperidol lactate (HALDOL) injection 2 mg  2 mg Intravenous Q6H PRN Oswald Hillock, MD   2 mg at 12/04/14 1137  . heparin injection 5,000 Units  5,000 Units Subcutaneous 3 times per day Ivor Costa, MD   5,000 Units at 12/04/14 [phone]  . hydrALAZINE (APRESOLINE) tablet 25 mg  25 mg Oral Q6H PRN Oswald Hillock, MD   25 mg at 12/03/14 1424  . insulin aspart (novoLOG) injection 0-9  Units  0-9 Units Subcutaneous TID WC Ivor Costa, MD   7 Units at 12/04/14 [phone]  . insulin glargine (LANTUS) injection 18 Units  18 Units Subcutaneous QHS Ivor Costa, MD   18 Units at 12/03/14 2156  . levofloxacin (LEVAQUIN) IVPB 500 mg  500 mg Intravenous Q24H Oswald Hillock, MD      . LORazepam (ATIVAN) injection 2 mg  2 mg Intravenous Q6H PRN Oswald Hillock, MD   2 mg at 12/04/14 0208  . metroNIDAZOLE (FLAGYL) IVPB 500 mg  500 mg Intravenous Q8H Oswald Hillock, MD      . pantoprazole (PROTONIX) EC tablet 40 mg  40 mg Oral Daily Ivor Costa, MD   40 mg at 12/04/14 1135  . sodium chloride (OCEAN) 0.65 % nasal spray 1 spray  1 spray Each Nare PRN Ivor Costa, MD      . sodium chloride 0.9 % injection 10-40 mL  10-40 mL Intracatheter PRN Ivor Costa, MD   10 mL at 12/03/14 0557  . sodium chloride 0.9 % injection 3 mL  3 mL Intravenous Q12H Ivor Costa, MD   3 mL at 12/02/14 0155  . vancomycin (VANCOCIN) 1,250 mg in sodium chloride 0.9 % 250 mL IVPB  1,250 mg Intravenous Q24H Rande Lawman Rumbarger, RPH   1,250 mg at 12/04/14 1135    Musculoskeletal: Strength & Muscle Tone: flaccid and atrophy Gait & Station: unable to stand Patient leans: N/A  Psychiatric Specialty Exam: Physical Exam as per history and physical   ROS hallucinations, insomnia and agitation   Blood pressure 107/64, pulse 109, temperature 97.5 F (36.4 C), temperature source Oral, resp. rate 18, height 5\' 8"  (1.727 m), weight 54.432 kg (120 lb), SpO2 100 %.Body mass index is 18.25 kg/(m^2).  General Appearance: Casual  Eye Contact::  Good  Speech:  Clear and Coherent  Volume:  Decreased  Mood:  Anxious and Irritable  Affect:  Appropriate and Congruent  Thought Process:  Coherent and Goal Directed  Orientation:  Full (Time, Place, and Person)  Thought Content:  Hallucinations: Auditory Tactile Visual  Suicidal Thoughts:  No  Homicidal Thoughts:  No  Memory:  Immediate;   Fair Recent;   Fair  Judgement:  Fair  Insight:  Fair   Psychomotor Activity:  Restlessness  Concentration:  Fair  Recall:  [employer] of Knowledge:Fair  Language: Good  Akathisia:  NA  Handed:  Right  AIMS (if indicated):     Assets:  Communication Skills Desire for Improvement Housing Intimacy Leisure Time Social Support  ADL's:  Impaired  Cognition: Impaired,  Mild  Sleep:      Medical Decision Making: New problem, with additional work up planned, Review of Psycho-Social Stressors (1), Review or order clinical lab tests (1), Established Problem, Worsening (2), Review or order medicine tests (1), Review of Medication Regimen & Side Effects (2) and Review of New Medication or Change in Dosage (2)  Treatment Plan Summary: Daily contact with patient to assess and evaluate symptoms and progress in treatment and Medication management  Plan:  We provide Seroquel 25 mg 3 times a day for agitation and hallucinations and Remeron 7.5 mg at bedtime for insomnia Patient does not meet criteria for psychiatric inpatient admission. Supportive therapy provided about ongoing stressors.  Appreciate psychiatric consultation and follow up as clinically required Please contact [phone] or [phone] if needs further assistance  Disposition: May be discharged to home when medically stable.  Meghan Tiemann,JANARDHAHA R. 12/04/2014 12:04 PM

## 2014-12-05 LAB — GLUCOSE, CAPILLARY
GLUCOSE-CAPILLARY: 312 mg/dL — AB (ref 70–99)
GLUCOSE-CAPILLARY: 312 mg/dL — AB (ref 70–99)
Glucose-Capillary: 209 mg/dL — ABNORMAL HIGH (ref 70–99)
Glucose-Capillary: 226 mg/dL — ABNORMAL HIGH (ref 70–99)

## 2014-12-05 MED ORDER — FENTANYL 25 MCG/HR TD PT72
25.0000 ug | MEDICATED_PATCH | TRANSDERMAL | Status: DC
  Administered 2014-12-05 – 2014-12-11 (×3): 25 ug via TRANSDERMAL
  Filled 2014-12-05 (×4): qty 1

## 2014-12-05 MED ORDER — SODIUM CHLORIDE 0.9 % IV SOLN
INTRAVENOUS | Status: DC
  Administered 2014-12-08: 07:00:00 via INTRAVENOUS

## 2014-12-05 MED ORDER — INSULIN GLARGINE 100 UNIT/ML ~~LOC~~ SOLN
25.0000 [IU] | Freq: Every day | SUBCUTANEOUS | Status: DC
Start: 2014-12-05 — End: 2014-12-10
  Administered 2014-12-05 – 2014-12-09 (×5): 25 [IU] via SUBCUTANEOUS
  Filled 2014-12-05 (×7): qty 0.25

## 2014-12-05 NOTE — Progress Notes (Signed)
ANTIBIOTIC CONSULT NOTE - FOLLOW UP  Pharmacy Consult:  Vancomycin Indication:  Osteomyelitis  Allergies  Allergen Reactions  . Cefuroxime Axetil Anaphylaxis  . Ertapenem Other (See Comments)    Rash and confusion  . Morphine And Related Other (See Comments)    Changed mental status, confusion, headache, visual hallucination  . Penicillins Anaphylaxis and Other (See Comments)    ?can take amoxicillin?  . Sulfa Antibiotics Anaphylaxis, Shortness Of Breath and Other (See Comments)  . Tessalon [Benzonatate] Anaphylaxis  . Shellfish Allergy Itching and Other (See Comments)    Took benadryl to alleviate reaction    Patient Measurements: Height: 5\' 8"  (172.7 cm) (from 11/08/14) Weight: 125 lb (56.7 kg) IBW/kg (Calculated) : 68.4  Vital Signs: Temp: 98.7 F (37.1 C) (02/28 0528) Temp Source: Oral (02/28 0528) BP: 110/40 mmHg (02/28 0528) Pulse Rate: 91 (02/28 0528) Intake/Output from previous day: 02/27 0701 - 02/28 0700 In: 480 [P.O.:480] Out: 1475 [Urine:925; Drains:50; Stool:500]  Labs:  Recent Labs  12/03/14 0558  WBC 14.2*  HGB 8.0*  PLT 540*  CREATININE 0.62   Estimated Creatinine Clearance: 108.3 mL/min (by C-G formula based on Cr of 0.62). No results for input(s): VANCOTROUGH, VANCOPEAK, VANCORANDOM, GENTTROUGH, GENTPEAK, GENTRANDOM, TOBRATROUGH, TOBRAPEAK, TOBRARND, AMIKACINPEAK, AMIKACINTROU, AMIKACIN in the last 72 hours.   Microbiology: Recent Results (from the past 720 hour(s))  Urine culture     Status: None   Collection Time: 11/08/14  3:13 PM  Result Value Ref Range Status   Specimen Description URINE, CATHETERIZED  Final   Special Requests NONE  Final   Colony Count   Final    >=100,000 COLONIES/ML Performed at [facility]   Final    BURKHOLDERIA CEPACIA Performed at [facility]    Report Status 11/11/2014 FINAL  Final   Organism ID, Bacteria BURKHOLDERIA CEPACIA  Final      Susceptibility   Burkholderia cepacia -  MIC*    LEVOFLOXACIN 4 INTERMEDIATE Intermediate     TRIMETH/SULFA 160 RESISTANT Resistant     CEFTAZIDIME 8 SENSITIVE Sensitive     * BURKHOLDERIA CEPACIA  Culture, blood (routine x 2)     Status: None   Collection Time: 11/08/14  5:25 PM  Result Value Ref Range Status   Specimen Description BLOOD ARM LEFT  Final   Special Requests BOTTLES DRAWN AEROBIC AND ANAEROBIC 10CC  Final   Culture   Final    KLEBSIELLA PNEUMONIAE Note: Culture results may be compromised due to an excessive volume of blood received in culture bottles. Gram Stain Report Called to,Read Back By and Verified With: Juanetta Beets 11/12/14 0144A [place] Performed at [facility]    Report Status 11/14/2014 FINAL  Final   Organism ID, Bacteria KLEBSIELLA PNEUMONIAE  Final      Susceptibility   Klebsiella pneumoniae - MIC*    AMPICILLIN RESISTANT      AMPICILLIN/SULBACTAM 4 SENSITIVE Sensitive     CEFAZOLIN <=4 SENSITIVE Sensitive     CEFEPIME <=1 SENSITIVE Sensitive     CEFTAZIDIME <=1 SENSITIVE Sensitive     CEFTRIAXONE <=1 SENSITIVE Sensitive     CIPROFLOXACIN <=0.25 SENSITIVE Sensitive     GENTAMICIN <=1 SENSITIVE Sensitive     IMIPENEM <=0.25 SENSITIVE Sensitive     PIP/TAZO <=4 SENSITIVE Sensitive     TOBRAMYCIN <=1 SENSITIVE Sensitive     TRIMETH/SULFA <=20 SENSITIVE Sensitive     * KLEBSIELLA PNEUMONIAE  Culture, blood (routine x 2)  Status: None   Collection Time: 11/08/14  5:35 PM  Result Value Ref Range Status   Specimen Description BLOOD ARM LEFT  Final   Special Requests BOTTLES DRAWN AEROBIC AND ANAEROBIC 10CC  Final   Culture   Final    KLEBSIELLA PNEUMONIAE Note: SUSCEPTIBILITIES PERFORMED ON PREVIOUS CULTURE WITHIN THE LAST 5 DAYS. Gram Stain Report Called to,Read Back By and Verified With: Iran Sizer RN @ 937DS [facility] 11/12/14 Performed at [facility]    Report Status 11/14/2014 FINAL  Final  Clostridium Difficile by PCR     Status: None   Collection Time: 11/12/14  8:41 AM   Result Value Ref Range Status   C difficile by pcr NEGATIVE NEGATIVE Final  Culture, blood (routine x 2)     Status: None   Collection Time: 11/12/14  8:04 PM  Result Value Ref Range Status   Specimen Description BLOOD LEFT ARM  Final   Special Requests BOTTLES DRAWN AEROBIC AND ANAEROBIC 10CC  Final   Culture   Final    NO GROWTH 5 DAYS Note: Culture results may be compromised due to an excessive volume of blood received in culture bottles. Performed at [facility]    Report Status 11/19/2014 FINAL  Final  Culture, blood (routine x 2)     Status: None   Collection Time: 11/12/14  8:19 PM  Result Value Ref Range Status   Specimen Description BLOOD LEFT ARM  Final   Special Requests   Final    BOTTLES DRAWN AEROBIC AND ANAEROBIC BLUE 10CC RED 5CC   Culture   Final    NO GROWTH 5 DAYS Performed at [facility]    Report Status 11/19/2014 FINAL  Final  Body fluid culture     Status: None   Collection Time: 11/15/14 11:03 AM  Result Value Ref Range Status   Specimen Description FLUID HIP RIGHT SYNOVIAL  Final   Special Requests NONE  Final   Gram Stain   Final    RARE WBC PRESENT, PREDOMINANTLY MONONUCLEAR NO ORGANISMS SEEN Performed at [facility]    Culture   Final    NO GROWTH 3 DAYS Performed at [facility]    Report Status 11/19/2014 FINAL  Final  Blood Cultures x 2 sites     Status: None   Collection Time: 11/16/14  7:00 PM  Result Value Ref Range Status   Specimen Description BLOOD RIGHT ARM  Final   Special Requests BOTTLES DRAWN AEROBIC ONLY 10CC  Final   Culture   Final    NO GROWTH 5 DAYS Performed at [facility]    Report Status 11/23/2014 FINAL  Final  Blood Cultures x 2 sites     Status: None   Collection Time: 11/16/14  7:12 PM  Result Value Ref Range Status   Specimen Description BLOOD LEFT HAND  Final   Special Requests BOTTLES DRAWN AEROBIC ONLY 1CC  Final   Culture   Final    NO GROWTH 5  DAYS Performed at [facility]    Report Status 11/23/2014 FINAL  Final  Blood culture (routine x 2)     Status: None (Preliminary result)   Collection Time: 12/01/14  4:05 PM  Result Value Ref Range Status   Specimen Description BLOOD  LAC  Final   Special Requests BOTTLES DRAWN AEROBIC AND ANAEROBIC 5ML EACH  Final   Culture   Final  BLOOD CULTURE RECEIVED NO GROWTH TO DATE CULTURE WILL BE HELD FOR 5 DAYS BEFORE ISSUING A FINAL NEGATIVE REPORT Performed at [facility]    Report Status PENDING  Incomplete  Urine culture     Status: None   Collection Time: 12/02/14  2:25 AM  Result Value Ref Range Status   Specimen Description URINE, CATHETERIZED  Final   Special Requests NONE  Final   Colony Count NO GROWTH Performed at [facility]   Final   Culture NO GROWTH Performed at [facility]   Final   Report Status 12/03/2014 FINAL  Final      Assessment: 42 YOM continues on vancomycin, levofloxacin, and metronidazole for osteomyelitis.  His renal function has been stable, but would be cautious with interpreting CrCL given quadriplegic status.   Vanc PTA >> (initally planning 6 weeks from 2/5) Merrem 2/25 >> 2/27 LVQ 2/27 >> Flagyl 2/27 >>  2/25 VT = 11.3 mcg/mL 1gm q24  2/25 UCx - negative 2/24 BCx x2 - NGTD   Goal of Therapy:  Vancomycin trough level 15-20 mcg/ml   Plan:  - Continue vanc 1250mg  IV Q24H - Continue LVQ 500mg  IV Q24H and Flagyl 500mg  IV Q8H as ordered - Monitor renal fxn, clinical progress, repeat vanc trough soon - Consider checking free T3/T4 and increase Lantus for better glycemic control (Lantus 25 units QHS PTA) - BMET in AM   Jason [name] D. Mina Marble, PharmD, BCPS Pager:  [phone] 12/05/2014, 11:17 AM

## 2014-12-05 NOTE — Progress Notes (Signed)
[facility] PROGRESS NOTE  Jason [name] KXF:[id number] DOB: 07-22-1986 DOA: 12/01/2014 PCP: [name], MD  Assessment/Plan: 1. Altered mental status- patient has complicated history with pneumonia ischial osteomyelitis, Klebsiella bacteremia and Burkhoderia UTI. Infection seems to be controlled with IV antibiotics. [name] has now been stopped, which can supposedly cause altered mental status. EEG has been ordered. Neurology again recommended to change the meropenem to some other antibiotic. I called and discussed with infectious disease Dr. Michel Bickers, who recommends to stop meropenem and start Flagyl as well as Levaquin along with vancomycin. 2. Hallucinations - improved after patient was started on Seroquel 25 mg 3 times a day per psych.  3. 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 It showed multiple food collections within the abductor magnus and abductor brevis muscles likely consistent with intramuscular abscesses. Patient was seen by plastic surgery underwent debridement on 11/12/2014 11/18/2014 and 11/25/2014 with VAC placement. Patient started on vancomycin and ertapenem as per infectious disease now the ertapenem was changed to meropenem. Blood culture urine culture has been collected. We'll follow the culture results, continue vancomycin, Flagyl, Levaquin. Will discontinue meropenem. Will start fentanyl patch 25 g every 72 hours 4. Klebsiella pneumonia bacteremia- blood culture  was positive for Klebsiella pneumonia, currently patient on meropenem and vancomycin 5. Burkholderia UTI- patient has been treated for UTI. No need to treat with more antibiotics as per ID. 6. Type 1 diabetes mellitus- will change the Lantus to 25 units subcutaneous daily. Continue sliding scale insulin 7. Functional quadriplegia, neurogenic bladder- bedbound, secondary to spinal cord infarction. 8. DVT  prophylaxis- heparin  Code Status: Full code Family Communication: *Spoke to patient's mother at bedside Disposition Plan: Home when stable   Consultants:  None  Procedures:  *None  Antibiotics:  Vancomycin  Meropenem  HPI/Subjective: 29 y.o. 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 Patient was recently hospitalized from 2/1 to 2/12. He was treated for ischial osteomyelitis, pneumonia, Klebsiella bacteremia, and Burkhoderia UTI in hospital. Patient was discharged on IV vancomycin plus ertapenem, which are supposed to be continued for total of 6 weeks per ID. Patient has been doing okay until 3 days ago when he started hallucinating. It seems that he is on the phone talking to somebody who is not there. Mother reports pt has not been sleeping at all in the past 3 days. Mother states that patient had hallucination 3 months ago, which was likely caused by morphine, since his symptoms resolved after discontinuation of morphine. This time, mother discontinued patient's Percocet, Lyrica and baclofen without any help. His mother states that patient developed new rashes over his forehead and upper face. He has back pain. No headache, neck rigidity or photophobia. Patient denies fever, chills, cough, chest pain, SOB, nausea, vomiting, abdominal pain, diarrhea, dysuria, urgency, frequency, hematuria or leg swelling.  In ED, patient was found to have negative CT head for acute abnormalities. Leukocytosis with WBC 13.0, negative UDS, lactate 1.06, positive urinalysis for moderate leukocytes, negative salicylate and acetaminophen level, ammonia 17, temperature normal. Electrolytes okay. Patient's admitted to inpatient for further evaluation and treatment.  Patient seen and examined, sleeping this  morning. Mother says that his mental status has improved after starting the psych medications. She also has been insisting on restarting fentanyl patch for pain  Objective: Filed Vitals:   12/05/14 0528  BP: 110/40  Pulse: 91  Temp: 98.7 F (37.1 C)  Resp: 18    Intake/Output Summary (Last 24 hours) at 12/05/14 1240 Last data filed at 12/05/14 0144  Gross per 24 hour  Intake    480 ml  Output   1425 ml  Net   -945 ml   Filed Weights   12/03/14 0500 12/04/14 0500 12/05/14 0500  Weight: 51.256 kg (113 lb) 54.432 kg (120 lb) 56.7 kg (125 lb)    Exam:   General:  *Appear in no acute distress  Cardiovascular: S1-S2 regular  Respiratory: Clear bilaterally  Abdomen: Soft nontender no organomegaly  Musculoskeletal: No edema    Data Reviewed: Basic Metabolic Panel:  Recent Labs Lab 12/01/14 1605 12/02/14 0410 12/03/14 0558  NA 134* 137 137  K 4.6 4.8 3.8  CL 115* 109 110  CO2 22 24 22   GLUCOSE 126* 158* 149*  BUN 9 6 6   CREATININE 0.71 0.62 0.62  CALCIUM 8.9 9.6 9.1  MG  --  1.2*  --    Liver Function Tests:  Recent Labs Lab 12/01/14 1605 12/02/14 0410  AST 25 23  ALT 19 18  ALKPHOS 128* 122*  BILITOT 0.4 0.6  PROT 7.8 7.6  ALBUMIN 2.7* 2.4*   No results for input(s): LIPASE, AMYLASE in the last 168 hours.  Recent Labs Lab 12/01/14 2008  AMMONIA 17   CBC:  Recent Labs Lab 12/01/14 1605 12/02/14 0410 12/03/14 0558  WBC 13.0* 13.6* 14.2*  HGB 8.8* 8.6* 8.0*  HCT 27.7* 26.9* 25.2*  MCV 84.5 82.0 81.6  PLT 640* 621* 540*   Cardiac Enzymes: No results for input(s): CKTOTAL, CKMB, CKMBINDEX, TROPONINI in the last 168 hours. BNP (last 3 results) No results for input(s): BNP in the last 8760 hours.  ProBNP (last 3 results)  Recent Labs  01/29/14 2113  PROBNP 1343.0*    CBG:  Recent Labs Lab 12/04/14 1152 12/04/14 1723 12/04/14 2123 12/05/14 0631 12/05/14 1136  GLUCAP 193* 364* 180* 312* 209*    Recent Results (from the  past 240 hour(s))  Blood culture (routine x 2)     Status: None (Preliminary result)   Collection Time: 12/01/14  4:05 PM  Result Value Ref Range Status   Specimen Description BLOOD  LAC  Final   Special Requests BOTTLES DRAWN AEROBIC AND ANAEROBIC 5ML EACH  Final   Culture   Final           BLOOD CULTURE RECEIVED NO GROWTH TO DATE CULTURE WILL BE HELD FOR 5 DAYS BEFORE ISSUING A FINAL NEGATIVE REPORT Performed at [facility]    Report Status PENDING  Incomplete  Urine culture     Status: None   Collection Time: 12/02/14  2:25 AM  Result Value Ref Range Status   Specimen Description URINE, CATHETERIZED  Final   Special Requests NONE  Final   Colony Count NO GROWTH Performed at [facility]   Final   Culture NO GROWTH Performed at [facility]   Final   Report Status 12/03/2014 FINAL  Final     Studies: No results found.  Scheduled Meds: . atorvastatin  20 mg Oral Daily  . collagenase  1 application Topical Daily  . feeding supplement (GLUCERNA SHAKE)  237 mL Oral TID BM  . fentaNYL  25 mcg Transdermal Q72H  . heparin  5,000 Units Subcutaneous 3 times per day  . insulin aspart  0-9  Units Subcutaneous TID WC  . insulin glargine  18 Units Subcutaneous QHS  . levofloxacin (LEVAQUIN) IV  500 mg Intravenous Q24H  . metronidazole  500 mg Intravenous Q8H  . mirtazapine  15 mg Oral QHS  . pantoprazole  40 mg Oral Daily  . QUEtiapine  25 mg Oral TID  . sodium chloride  3 mL Intravenous Q12H  . vancomycin  1,250 mg Intravenous Q24H   Continuous Infusions: . sodium chloride 20 mL/hr at 12/04/14 1720    Principal Problem:   Acute encephalopathy Active Problems:   Type 1 diabetes, uncontrolled, with neuropathy   Asthma   GERD   Gastroparesis due to DM   Protein-calorie malnutrition, severe   Spinal cord infarction (history of)   Tetraplegia   Neurogenic bladder   UTI (urinary tract infection)   Sacral pressure sore   Stage IV decubitus ulcer    Osteomyelitis, pelvis   Anemia   Burkholderia cepacia infection   Disorientation    Time spent: 20 minutes    [facility] Pager [phone]. If 7PM-7AM, please contact night-coverage at [email or web address], password [facility] 12/05/2014, 12:40 PM  LOS: 4 days

## 2014-12-06 ENCOUNTER — Other Ambulatory Visit: Payer: Self-pay | Admitting: Plastic Surgery

## 2014-12-06 DIAGNOSIS — L89154 Pressure ulcer of sacral region, stage 4: Secondary | ICD-10-CM

## 2014-12-06 DIAGNOSIS — G9340 Encephalopathy, unspecified: Secondary | ICD-10-CM

## 2014-12-06 DIAGNOSIS — R443 Hallucinations, unspecified: Secondary | ICD-10-CM

## 2014-12-06 LAB — GLUCOSE, CAPILLARY
GLUCOSE-CAPILLARY: 206 mg/dL — AB (ref 70–99)
Glucose-Capillary: 220 mg/dL — ABNORMAL HIGH (ref 70–99)
Glucose-Capillary: 139 mg/dL — ABNORMAL HIGH (ref 70–99)
Glucose-Capillary: 219 mg/dL — ABNORMAL HIGH (ref 70–99)

## 2014-12-06 LAB — BASIC METABOLIC PANEL
ANION GAP: 4 — AB (ref 5–15)
BUN: 12 mg/dL (ref 6–23)
CO2: 21 mmol/L (ref 19–32)
Calcium: 9.4 mg/dL (ref 8.4–10.5)
Chloride: 112 mmol/L (ref 96–112)
Creatinine, Ser: 0.83 mg/dL (ref 0.50–1.35)
GFR calc Af Amer: >90 mL/min (ref >90)
GFR calc non Af Amer: >90 mL/min (ref >90)
GLUCOSE: 246 mg/dL — AB (ref 70–99)
POTASSIUM: 4.3 mmol/L (ref 3.5–5.1)
SODIUM: 137 mmol/L (ref 135–145)

## 2014-12-06 MED ORDER — OXYCODONE-ACETAMINOPHEN 5-325 MG PO TABS
1.0000 | ORAL_TABLET | Freq: Four times a day (QID) | ORAL | Status: DC | PRN
  Administered 2014-12-07 – 2014-12-09 (×8): 2 via ORAL
  Filled 2014-12-06 (×9): qty 2

## 2014-12-06 MED ORDER — LORAZEPAM 2 MG/ML IJ SOLN
1.0000 mg | Freq: Four times a day (QID) | INTRAMUSCULAR | Status: DC | PRN
  Filled 2014-12-06: qty 1

## 2014-12-06 MED ORDER — LIDOCAINE 5 % EX OINT
1.0000 "application " | TOPICAL_OINTMENT | CUTANEOUS | Status: DC | PRN
  Administered 2014-12-07: 1 via TOPICAL
  Filled 2014-12-06 (×2): qty 35.44

## 2014-12-06 NOTE — Consult Note (Signed)
Psychiatry Consult Follow up  Reason for Consult:  Halllucinations Referring Physician:  Dr. Darrick Meigs Patient Identification: Jason [name] MRN:  [id number] Principal Diagnosis: Acute encephalopathy Diagnosis:   Patient Active Problem List   Diagnosis Date Noted  . Disorientation [R41.0] 12/01/2014  . Finger ulcer [L98.499]   . Pleural effusion [J90]   . Diabetic ulcer of left foot associated with diabetes mellitus due to underlying condition [E08.621, L97.529]   . Aspiration pneumonia [J69.0]   . Right hip joint effusion [M25.451]   . Klebsiella pneumoniae infection [B96.1]   . Decubitus ulcer of ischial area [L89.309]   . Sacral decubitus ulcer [L89.159]   . Gram-negative bacteremia [R78.81, A41.50]   . 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 Urinary tract infectious disease [N39.0]   . Suprapubic catheter [Z93.59]   . Osteomyelitis, pelvic region and thigh [M86.9]   . Septic hip [M00.9]   . Decubitus ulcer [L89.90]   . Anemia [D64.9] 10/22/2014  . AKI (acute kidney injury) [N17.9]   . Staphylococcus aureus bacteremia with sepsis [A41.01]   . Abscess [L02.91]   . Osteomyelitis, pelvis [M86.9]   . Cellulitis and abscess of leg, except foot [L02.419, L03.119]   . Blood poisoning [A41.9]   . Endotracheally intubated [Z78.9]   . Encounter for orogastric (OG) tube placement [Z46.59]   . Acute respiratory failure with hypoxia [J96.01] 09/17/2014  . Osteomyelitis [M86.9]   . Type 1 diabetes mellitus with hyperglycemia [E10.65] 08/22/2014  . Osteomyelitis hip [M86.9]   . 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 Bacteremia [R78.81]   . Severe sepsis [A41.9, R65.20]   . Staphylococcus aureus bacteremia [R78.81, B95.61] 08/14/2014  . Sacral pressure sore [L89.159] 08/14/2014  . Sepsis [A41.9] 08/13/2014  . Acute respiratory failure with hypercapnia [J96.02] 08/13/2014  . UTI (lower urinary tract infection) [N39.0]   . Healthcare-associated pneumonia [J18.9]   . Renal failure  (ARF), acute on chronic [N17.9, N18.9]   . Pneumonia [J18.9] 08/12/2014  . Elevated transaminase level [R74.0] 07/30/2014  . Hyperkalemia [E87.5] 07/29/2014  . Acute encephalopathy [G93.40] 07/18/2014  . UTI (urinary tract infection) [N39.0] 07/18/2014  . Fever [R50.9] 05/28/2014  . SIRS (systemic inflammatory response syndrome) [A41.9] 05/28/2014  . Hypoglycemia [E16.2] 05/28/2014  . Diarrhea [R19.7] 05/14/2014  . Tetraplegia [G82.50] 04/19/2014  . Neurogenic bladder [N31.9] 04/19/2014  . Spinal cord infarction (history of) [G95.19] 02/05/2014  . Diabetic foot ulcer [E11.621, L97.509] 02/04/2014  . IDDM (insulin dependent diabetes mellitus) [E11.9, Z79.4] 10/04/2013  . Protein-calorie malnutrition, severe [E43] 09/24/2013  . Gastroparesis due to DM [E11.43] 06/02/2013  . VIRAL URI [J06.9] 09/15/2007  . GERD [K21.9] 09/15/2007  . Type 1 diabetes, uncontrolled, with neuropathy [E10.41] 05/29/2007  . Asthma [J45.909] 05/29/2007    Total Time spent with patient: 20 minutes  Subjective:   Jason [name] is a 29 y.o. male patient admitted with hallucinations.  HPI:  Jason [name] is a 29 y.o. male seen, chart reviewed and case discussed with the Dr. Darrick Meigs and patient mother who is at bedside. Psychiatric consultation was called in for the evaluation of new onset psychosis including visual, auditory and tactile hallucinations since he was hospitalized and receiving antibiotic medication or infections. Patient mother reported patient has been suffering with severe anxiety, irritability and agitation constantly fighting in the a with his hands and could not sleep more than 5 days. Patient reported he is seeing dogs coming into his room and also snakes crawling on him and hearing dog barking. Patient has  a multiple medical problems and quadriplegic secondary to spinal stroke.  Interval History: Patient has been compliant with his medications and has no side effects. Patient and his mother  states that he has been sleeping better and no hallucinations and throwing his hands and trying to grab as if he is seeing some thing in air. Patient mom states that he received seroquel and ativan together this morning, since than he seems to be confused or over sedated and asks to adjust medications to prevent further sedation. She also noted that he has been talking himself and remember about his trucks and making plans of going out in 15 minutes etc, patient told his mother he likes to talk to himself some time. He denied suicide or homicide ideations and has no evidence of acute psychosis.    Past Medical History:  Past Medical History  Diagnosis Date  . GERD (gastroesophageal reflux disease)   . Asthma   . Hx MRSA infection     on face  . Gastroparesis   . Diabetic neuropathy   . Seizures   . Family history of anesthesia complication     Pt mother can't have epidural procedures  . Dysrhythmia   . Pneumonia   . Arthritis   . Fibromyalgia   . 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 Type I diabetes mellitus     sees Dr. Loanne Drilling     Past Surgical History  Procedure Laterality Date  . Tonsillectomy    . Multiple extractions with alveoloplasty N/A 08/03/2014    Procedure: MULTIPLE EXTRACTIONS;  Surgeon: Gae Bon, DDS;  Location: [place];  Service: Oral Surgery;  Laterality: N/A;  . Tee without cardioversion N/A 08/17/2014    Procedure: TRANSESOPHAGEAL ECHOCARDIOGRAM (TEE);  Surgeon: Dorothy Spark, MD;  Location: [facility] ENDOSCOPY;  Service: Cardiovascular;  Laterality: N/A;  . Debridment of decubitus ulcer N/A 10/04/2014    Procedure: DEBRIDMENT OF DECUBITUS ULCER;  Surgeon: Georganna Skeans, MD;  Location: [place];  Service: General;  Laterality: N/A;  . Laparoscopic diverted colostomy N/A 10/12/2014    Procedure: LAPAROSCOPIC DIVERTING COLOSTOMY;  Surgeon: Donnie Mesa, MD;  Location: [place];  Service: General;  Laterality: N/A;  . Insertion of suprapubic  catheter N/A 10/12/2014    Procedure: INSERTION OF SUPRAPUBIC CATHETER;  Surgeon: Reece Packer, MD;  Location: [place];  Service: Urology;  Laterality: N/A;  . Incision and drainage of wound N/A 11/12/2014    Procedure: IRRIGATION AND DEBRIDEMENT OF WOUNDS WITH BONE BIOPSY AND SURGICAL PREP ;  Surgeon: Theodoro Kos, DO;  Location: [place];  Service: Plastics;  Laterality: N/A;  . Application of a-cell of back N/A 11/12/2014    Procedure: APPLICATION A CELL AND VAC ;  Surgeon: Theodoro Kos, DO;  Location: [place];  Service: Plastics;  Laterality: N/A;  . Incision and drainage of wound N/A 11/18/2014    Procedure: IRRIGATION AND DEBRIDEMENT OF SACRAL ULCER ONLY WITH PLACEMENT OF A CELL AND VAC/ DRESSING CHANGE TO UPPER BACK AREA.;  Surgeon: Theodoro Kos, DO;  Location: [place];  Service: Plastics;  Laterality: N/A;  . Incision and drainage of wound N/A 11/25/2014    Procedure: IRRIGATION AND DEBRIDEMENT OF SACRAL ULCER AND BACK BURN WITH PLACEMENT OF A-CELL;  Surgeon: Theodoro Kos, DO;  Location: [place];  Service: Plastics;  Laterality: N/A;  . Minor application of wound vac N/A 11/25/2014    Procedure:  WOUND VAC CHANGE;  Surgeon: Theodoro Kos, DO;  Location:  [place] [facility];  Service: Plastics;  Laterality: N/A;   Family History:  Family History  Problem Relation Age of Onset  . Diabetes Father   . Hypertension Father   . Asthma      fhx  . Hypertension      fhx  . Stroke      fhx  . Heart disease Mother    Social History:  History  Alcohol Use No    Comment: rarely     History  Drug Use No    History   Social History  . Marital Status: Single    Spouse Name: N/A  . Number of Children: 0  . Years of Education: 11   Occupational History  . Disability    Social History Main Topics  . Smoking status: Former Smoker -- 12 years    Types: Cigars, Cigarettes    Quit date: 09/07/2014  . Smokeless tobacco: Never Used     Comment: e-cigarette and cigars currently  . Alcohol Use: No      Comment: rarely  . Drug Use: No  . Sexual Activity: Not Currently   Other Topics Concern  . None   Social History Narrative   Patient lives at home with mother father.    Patient has 2 children.    Patient is single.    Patient was left hand but after stroke patient is now right handed.    Patient has  11th grade education.    Additional Social History:  Allergies:   Allergies  Allergen Reactions  . Cefuroxime Axetil Anaphylaxis  . Ertapenem Other (See Comments)    Rash and confusion  . Morphine And Related Other (See Comments)    Changed mental status, confusion, headache, visual hallucination  . Penicillins Anaphylaxis and Other (See Comments)    ?can take amoxicillin?  . Sulfa Antibiotics Anaphylaxis, Shortness Of Breath and Other (See Comments)  . Tessalon [Benzonatate] Anaphylaxis  . Shellfish Allergy Itching and Other (See Comments)    Took benadryl to alleviate reaction    Vitals: Blood pressure 100/65, pulse 86, temperature 99.3 F (37.4 C), temperature source Oral, resp. rate 18, height 5\' 8"  (1.727 m), weight 56.7 kg (125 lb), SpO2 100 %.  Risk to Self: Is patient at risk for suicide?: No Risk to Others:   Prior Inpatient Therapy:   Prior Outpatient Therapy:    Current Facility-Administered Medications  Medication Dose Route Frequency Provider Last Rate Last Dose  . 0.9 %  sodium chloride infusion   Intravenous Continuous Oswald Hillock, MD 100 mL/hr at 12/05/14 1419    . albuterol (PROVENTIL) (2.5 MG/3ML) 0.083% nebulizer solution 2.5 mg  2.5 mg Nebulization Q4H PRN Ivor Costa, MD      . atorvastatin (LIPITOR) tablet 20 mg  20 mg Oral Daily Ivor Costa, MD   20 mg at 12/06/14 1011  . collagenase (SANTYL) ointment 1 application  1 application Topical Daily Ivor Costa, MD   1 application at 35/00/93 1015  . diclofenac sodium (VOLTAREN) 1 % transdermal gel 2 g  2 g Topical Daily PRN Ivor Costa, MD      . feeding supplement (GLUCERNA SHAKE) (GLUCERNA SHAKE) liquid 237  mL  237 mL Oral BID PRN Ivor Costa, MD   237 mL at 12/02/14 1236  . feeding supplement (GLUCERNA SHAKE) (GLUCERNA SHAKE) liquid 237 mL  237 mL Oral TID BM Stephanie La, RD   237 mL at 12/06/14 1314  . fentaNYL (DURAGESIC - dosed mcg/hr) patch 25  mcg  25 mcg Transdermal Q72H Oswald Hillock, MD   25 mcg at 12/05/14 1227  . haloperidol lactate (HALDOL) injection 2 mg  2 mg Intravenous Q6H PRN Oswald Hillock, MD   2 mg at 12/04/14 1137  . heparin injection 5,000 Units  5,000 Units Subcutaneous 3 times per day Ivor Costa, MD   5,000 Units at 12/06/14 1314  . hydrALAZINE (APRESOLINE) tablet 25 mg  25 mg Oral Q6H PRN Oswald Hillock, MD   25 mg at 12/03/14 1424  . insulin aspart (novoLOG) injection 0-9 Units  0-9 Units Subcutaneous TID WC Ivor Costa, MD   3 Units at 12/06/14 1302  . insulin glargine (LANTUS) injection 25 Units  25 Units Subcutaneous QHS Oswald Hillock, MD   25 Units at 12/05/14 2150  . levofloxacin (LEVAQUIN) IVPB 500 mg  500 mg Intravenous Q24H Oswald Hillock, MD   500 mg at 12/05/14 1418  . LORazepam (ATIVAN) injection 2 mg  2 mg Intravenous Q6H PRN Oswald Hillock, MD   2 mg at 12/06/14 1010  . metroNIDAZOLE (FLAGYL) IVPB 500 mg  500 mg Intravenous Q8H Oswald Hillock, MD   500 mg at 12/06/14 0348  . mirtazapine (REMERON) tablet 15 mg  15 mg Oral QHS Durward Parcel, MD   15 mg at 12/04/14 2243  . oxyCODONE-acetaminophen (PERCOCET/ROXICET) 5-325 MG per tablet 1 tablet  1 tablet Oral Q6H PRN Oswald Hillock, MD   1 tablet at 12/06/14 0347  . pantoprazole (PROTONIX) EC tablet 40 mg  40 mg Oral Daily Ivor Costa, MD   40 mg at 12/06/14 1010  . QUEtiapine (SEROQUEL) tablet 25 mg  25 mg Oral TID Durward Parcel, MD   25 mg at 12/06/14 1011  . sodium chloride (OCEAN) 0.65 % nasal spray 1 spray  1 spray Each Nare PRN Ivor Costa, MD      . sodium chloride 0.9 % injection 10-40 mL  10-40 mL Intracatheter PRN Ivor Costa, MD   10 mL at 12/03/14 0557  . sodium chloride 0.9 % injection 3 mL  3 mL  Intravenous Q12H Ivor Costa, MD   3 mL at 12/05/14 2150  . vancomycin (VANCOCIN) 1,250 mg in sodium chloride 0.9 % 250 mL IVPB  1,250 mg Intravenous Q24H Rande Lawman Rumbarger, RPH   1,250 mg at 12/06/14 1302    Musculoskeletal: Strength & Muscle Tone: flaccid and atrophy Gait & Station: unable to stand Patient leans: N/A  Psychiatric Specialty Exam: Physical Exam as per history and physical   ROS hallucinations, insomnia and agitation   Blood pressure 100/65, pulse 86, temperature 99.3 F (37.4 C), temperature source Oral, resp. rate 18, height 5\' 8"  (1.727 m), weight 56.7 kg (125 lb), SpO2 100 %.Body mass index is 19.01 kg/(m^2).  General Appearance: Casual  Eye Contact::  Good  Speech:  Clear and Coherent  Volume:  Decreased  Mood:  Anxious and Irritable  Affect:  Appropriate and Congruent  Thought Process:  Coherent and Goal Directed  Orientation:  Full (Time, Place, and Person)  Thought Content:  Hallucinations: Auditory Tactile Visual  Suicidal Thoughts:  No  Homicidal Thoughts:  No  Memory:  Immediate;   Fair Recent;   Fair  Judgement:  Fair  Insight:  Fair  Psychomotor Activity:  Restlessness  Concentration:  Fair  Recall:  [employer] of Knowledge:Fair  Language: Good  Akathisia:  NA  Handed:  Right  AIMS (if indicated):  Assets:  Communication Skills Desire for Improvement Housing Intimacy Leisure Time Social Support  ADL's:  Impaired  Cognition: Impaired,  Mild  Sleep:      Medical Decision Making: New problem, with additional work up planned, Review of Psycho-Social Stressors (1), Review or order clinical lab tests (1), Established Problem, Worsening (2), Review or order medicine tests (1), Review of Medication Regimen & Side Effects (2) and Review of New Medication or Change in Dosage (2)  Treatment Plan Summary: Daily contact with patient to assess and evaluate symptoms and progress in treatment and Medication management  Plan:  Decrease Ativan to  1 mg IV Q6hrs PRN for anxiety and sedation Continue Seroquel 25 mg 3 times a day for agitation and hallucinations  Continue Remeron 7.5 mg at bedtime for insomnia Patient does not meet criteria for psychiatric inpatient admission. Supportive therapy provided about ongoing stressors.  Appreciate psychiatric consultation and follow up as clinically required Please contact [phone] or [phone] if needs further assistance  Disposition: May be discharged to home when medically stable.  Janelle Culton,JANARDHAHA R. 12/06/2014 3:47 PM

## 2014-12-06 NOTE — Progress Notes (Signed)
Inpatient Diabetes Program Recommendations  AACE/ADA: New Consensus Statement on Inpatient Glycemic Control (2013)  Target Ranges:  Prepandial:   less than 140 mg/dL      Peak postprandial:   less than 180 mg/dL (1-2 hours)      Critically ill patients:  140 - 180 mg/dL   Reason for Visit: Hyperglycemia  Results for Jason [name], Jason [name] (MRN [id number]) as of 12/06/2014 09:56  Ref. Range 12/05/2014 06:31 12/05/2014 11:36 12/05/2014 17:49 12/05/2014 21:18 12/06/2014 06:36  Glucose-Capillary Latest Range: 70-99 mg/dL 312 (H) 209 (H) 312 (H) 226 (H) 220 (H)    Would benefit from tighter glycemic control for healing.  Inpatient Diabetes Program Recommendations Insulin - Basal: Increase Lantus to 28 units QHS Correction (SSI): Increase Novolog to moderate tidwc and hs Insulin - Meal Coverage: Add Novolog 4 units tidwc for meal coverage insulin if pt eats >50% meal  Note: Will continue to follow.  Thank you. Lorenda Peck, RD, LDN, CDE Inpatient Diabetes Coordinator [phone]

## 2014-12-06 NOTE — Progress Notes (Signed)
[facility] PROGRESS NOTE  Jason [name] IDP:[id number] DOB: 1986/06/09 DOA: 12/01/2014 PCP: [name], MD  Assessment/Plan: 1. Altered mental status- resolved , patient has complicated history with pneumonia ischial osteomyelitis, Klebsiella bacteremia and Burkhoderia UTI. Infection seems to be controlled with IV antibiotics. [name] has now been stopped, which can supposedly cause altered mental status. EEG has been ordered. Neurology again recommended to change the meropenem to some other antibiotic. I called and discussed with infectious disease Dr. Michel Bickers, who recommends to stop meropenem and start Flagyl as well as Levaquin along with vancomycin. 2. Hallucinations - improved after patient was started on Seroquel 25 mg 3 times a day per psych.  3. 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 It showed multiple food collections within the abductor magnus and abductor brevis muscles likely consistent with intramuscular abscesses. Patient was seen by plastic surgery underwent debridement on 11/12/2014 11/18/2014 and 11/25/2014 with VAC placement. Patient started on vancomycin and ertapenem as per infectious disease now the ertapenem was changed to meropenem. Blood culture urine culture has been collected. We'll follow the culture results, continue vancomycin, Flagyl, Levaquin. Will discontinue meropenem. Will start fentanyl patch 25 g every 72 hours. Patient has debridement of decubitus ulcer scheduled by plastic surgery on Wednesday 4. Klebsiella pneumonia bacteremia- blood culture  was positive for Klebsiella pneumonia, currently patient on meropenem and vancomycin 5. Burkholderia UTI- patient has been treated for UTI. No need to treat with more antibiotics as per ID. 6. Type 1 diabetes mellitus- will change the Lantus to 25 units subcutaneous daily. Continue sliding scale  insulin 7. Functional quadriplegia, neurogenic bladder- bedbound, secondary to spinal cord infarction. 8. DVT prophylaxis- heparin  Code Status: Full code Family Communication: *Spoke to patient's mother at bedside Disposition Plan: Home when stable   Consultants:  None  Procedures:  *None  Antibiotics:  Vancomycin  Meropenem  HPI/Subjective: 29 y.o. 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 Patient was recently hospitalized from 2/1 to 2/12. He was treated for ischial osteomyelitis, pneumonia, Klebsiella bacteremia, and Burkhoderia UTI in hospital. Patient was discharged on IV vancomycin plus ertapenem, which are supposed to be continued for total of 6 weeks per ID. Patient has been doing okay until 3 days ago when he started hallucinating. It seems that he is on the phone talking to somebody who is not there. Mother reports pt has not been sleeping at all in the past 3 days. Mother states that patient had hallucination 3 months ago, which was likely caused by morphine, since his symptoms resolved after discontinuation of morphine. This time, mother discontinued patient's Percocet, Lyrica and baclofen without any help. His mother states that patient developed new rashes over his forehead and upper face. He has back pain. No headache, neck rigidity or photophobia. Patient denies fever, chills, cough, chest pain, SOB, nausea, vomiting, abdominal pain, diarrhea, dysuria, urgency, frequency, hematuria or leg swelling.  In ED, patient was found to have negative CT head for acute abnormalities. Leukocytosis with WBC 13.0, negative UDS, lactate 1.06, positive urinalysis for moderate leukocytes, negative salicylate and acetaminophen level, ammonia 17, temperature normal. Electrolytes okay.  Patient's admitted to inpatient for further evaluation and treatment.  Patient seen and examined, no new complaints. Patient's mental status has  improved  Objective: Filed Vitals:  12/06/14 0531  BP: 123/77  Pulse: 88  Temp: 98.7 F (37.1 C)  Resp: 18    Intake/Output Summary (Last 24 hours) at 12/06/14 1439 Last data filed at 12/06/14 0532  Gross per 24 hour  Intake    480 ml  Output   3000 ml  Net  -2520 ml   Filed Weights   12/03/14 0500 12/04/14 0500 12/05/14 0500  Weight: 51.256 kg (113 lb) 54.432 kg (120 lb) 56.7 kg (125 lb)    Exam:   General:  *Appear in no acute distress  Cardiovascular: S1-S2 regular  Respiratory: Clear bilaterally  Abdomen: Soft nontender no organomegaly  Musculoskeletal: No edema    Data Reviewed: Basic Metabolic Panel:  Recent Labs Lab 12/01/14 1605 12/02/14 0410 12/03/14 0558 12/06/14 0535  NA 134* 137 137 137  K 4.6 4.8 3.8 4.3  CL 115* 109 110 112  CO2 22 24 22 21   GLUCOSE 126* 158* 149* 246*  BUN 9 6 6 12   CREATININE 0.71 0.62 0.62 0.83  CALCIUM 8.9 9.6 9.1 9.4  MG  --  1.2*  --   --    Liver Function Tests:  Recent Labs Lab 12/01/14 1605 12/02/14 0410  AST 25 23  ALT 19 18  ALKPHOS 128* 122*  BILITOT 0.4 0.6  PROT 7.8 7.6  ALBUMIN 2.7* 2.4*   No results for input(s): LIPASE, AMYLASE in the last 168 hours.  Recent Labs Lab 12/01/14 2008  AMMONIA 17   CBC:  Recent Labs Lab 12/01/14 1605 12/02/14 0410 12/03/14 0558  WBC 13.0* 13.6* 14.2*  HGB 8.8* 8.6* 8.0*  HCT 27.7* 26.9* 25.2*  MCV 84.5 82.0 81.6  PLT 640* 621* 540*   Cardiac Enzymes: No results for input(s): CKTOTAL, CKMB, CKMBINDEX, TROPONINI in the last 168 hours. BNP (last 3 results) No results for input(s): BNP in the last 8760 hours.  ProBNP (last 3 results)  Recent Labs  01/29/14 2113  PROBNP 1343.0*    CBG:  Recent Labs Lab 12/05/14 1136 12/05/14 1749 12/05/14 2118 12/06/14 0636 12/06/14 1111  GLUCAP 209* 312*  226* 220* 206*    Recent Results (from the past 240 hour(s))  Blood culture (routine x 2)     Status: None (Preliminary result)   Collection Time: 12/01/14  4:05 PM  Result Value Ref Range Status   Specimen Description BLOOD  LAC  Final   Special Requests BOTTLES DRAWN AEROBIC AND ANAEROBIC 5ML EACH  Final   Culture   Final           BLOOD CULTURE RECEIVED NO GROWTH TO DATE CULTURE WILL BE HELD FOR 5 DAYS BEFORE ISSUING A FINAL NEGATIVE REPORT Performed at [facility]    Report Status PENDING  Incomplete  Urine culture     Status: None   Collection Time: 12/02/14  2:25 AM  Result Value Ref Range Status   Specimen Description URINE, CATHETERIZED  Final   Special Requests NONE  Final   Colony Count NO GROWTH Performed at [facility]   Final   Culture NO GROWTH Performed at [facility]   Final   Report Status 12/03/2014 FINAL  Final     Studies: No results found.  Scheduled Meds: . atorvastatin  20 mg Oral Daily  . collagenase  1 application Topical Daily  . feeding supplement (GLUCERNA SHAKE)  237 mL Oral TID BM  . fentaNYL  25 mcg Transdermal Q72H  . heparin  5,000 Units Subcutaneous 3 times per  day  . insulin aspart  0-9 Units Subcutaneous TID WC  . insulin glargine  25 Units Subcutaneous QHS  . levofloxacin (LEVAQUIN) IV  500 mg Intravenous Q24H  . metronidazole  500 mg Intravenous Q8H  . mirtazapine  15 mg Oral QHS  . pantoprazole  40 mg Oral Daily  . QUEtiapine  25 mg Oral TID  . sodium chloride  3 mL Intravenous Q12H  . vancomycin  1,250 mg Intravenous Q24H   Continuous Infusions: . sodium chloride 100 mL/hr at 12/05/14 1419    Principal Problem:   Acute encephalopathy Active Problems:   Type 1 diabetes, uncontrolled, with neuropathy   Asthma   GERD   Gastroparesis due to DM   Protein-calorie malnutrition, severe   Spinal cord infarction (history of)   Tetraplegia   Neurogenic bladder   UTI (urinary tract infection)    Sacral pressure sore   Stage IV decubitus ulcer   Osteomyelitis, pelvis   Anemia   Burkholderia cepacia infection   Disorientation    Time spent: 20 minutes    [facility] Pager [phone]. If 7PM-7AM, please contact night-coverage at [email or web address], password [facility] 12/06/2014, 2:39 PM  LOS: 5 days

## 2014-12-06 NOTE — Progress Notes (Signed)
Subjective: Patient alert and oriented.  No longer having upper extremity dystonic movements.  Per mother confusion has improved significantly to point it is "few, and far in between".   Objective: Current vital signs: BP 123/77 mmHg  Pulse 88  Temp(Src) 98.7 F (37.1 C) (Oral)  Resp 18  Ht 5\' 8"  (1.727 m)  Wt 56.7 kg (125 lb)  BMI 19.01 kg/m2  SpO2 100% Vital signs in last 24 hours: Temp:  [98.6 F (37 C)-98.9 F (37.2 C)] 98.7 F (37.1 C) (02/29 0531) Pulse Rate:  [88-92] 88 (02/29 0531) Resp:  [18] 18 (02/29 0531) BP: (114-123)/(70-77) 123/77 mmHg (02/29 0531) SpO2:  [100 %] 100 % (02/29 0531)  Intake/Output from previous day: 02/28 0701 - 02/29 0700 In: 480 [P.O.:480] Out: 3000 [Urine:2200; Stool:800] Intake/Output this shift:   Nutritional status: Diet Carb Modified  Neurologic Exam: General: Mental Status: Alert, oriented, thought content appropriate.  Speech fluent without evidence of aphasia.  Able to follow 3 step commands without difficulty. Cranial Nerves: II:  Visual fields grossly normal, pupils equal, round, reactive to light and accommodation III,IV, VI: ptosis not present, extra-ocular motions intact bilaterally V,VII: smile symmetric, facial light touch sensation normal bilaterally VIII: hearing normal bilaterally IX,X: gag reflex present XI: bilateral shoulder shrug XII: midline tongue extension without atrophy or fasciculations  Motor: Right : Upper extremity   4-/5    Left:     Upper extremity   4-/5 --no spatic movements when holding arms antigravity but has dysmetria due to decreased strength.   Lower extremity   0/5     Lower extremity   0/5 Tone and bulk:normal tone throughout; no atrophy noted Sensory: Pinprick and light touch intact throughout, bilaterally Deep Tendon Reflexes:  Right: Upper Extremity   Left: Upper extremity   biceps (C-5 to C-6) 2/4   biceps (C-5 to C-6) 2/4 tricep (C7) 2/4    triceps (C7) 2/4 Brachioradialis (C6)  2/4  Brachioradialis (C6) 2/4  No KJ or AJ  Plantars: Mute bilaterally Cerebellar: Dysmetric finger-to-nose due to strength.   Lab Results: Basic Metabolic Panel:  Recent Labs Lab 12/01/14 1605 12/02/14 0410 12/03/14 0558 12/06/14 0535  NA 134* 137 137 137  K 4.6 4.8 3.8 4.3  CL 115* 109 110 112  CO2 22 24 22 21   GLUCOSE 126* 158* 149* 246*  BUN 9 6 6 12   CREATININE 0.71 0.62 0.62 0.83  CALCIUM 8.9 9.6 9.1 9.4  MG  --  1.2*  --   --     Liver Function Tests:  Recent Labs Lab 12/01/14 1605 12/02/14 0410  AST 25 23  ALT 19 18  ALKPHOS 128* 122*  BILITOT 0.4 0.6  PROT 7.8 7.6  ALBUMIN 2.7* 2.4*   No results for input(s): LIPASE, AMYLASE in the last 168 hours.  Recent Labs Lab 12/01/14 2008  AMMONIA 17    CBC:  Recent Labs Lab 12/01/14 1605 12/02/14 0410 12/03/14 0558  WBC 13.0* 13.6* 14.2*  HGB 8.8* 8.6* 8.0*  HCT 27.7* 26.9* 25.2*  MCV 84.5 82.0 81.6  PLT 640* 621* 540*    Cardiac Enzymes: No results for input(s): CKTOTAL, CKMB, CKMBINDEX, TROPONINI in the last 168 hours.  Lipid Panel: No results for input(s): CHOL, TRIG, HDL, CHOLHDL, VLDL, LDLCALC in the last 168 hours.  CBG:  Recent Labs Lab 12/05/14 0631 12/05/14 1136 12/05/14 1749 12/05/14 2118 12/06/14 0636  GLUCAP 312* 209* 312* 226* 220*    Microbiology: Results for orders placed or performed during  the hospital encounter of 12/01/14  Blood culture (routine x 2)     Status: None (Preliminary result)   Collection Time: 12/01/14  4:05 PM  Result Value Ref Range Status   Specimen Description BLOOD  LAC  Final   Special Requests BOTTLES DRAWN AEROBIC AND ANAEROBIC 5ML EACH  Final   Culture   Final           BLOOD CULTURE RECEIVED NO GROWTH TO DATE CULTURE WILL BE HELD FOR 5 DAYS BEFORE ISSUING A FINAL NEGATIVE REPORT Performed at [facility]    Report Status PENDING  Incomplete  Urine culture     Status: None   Collection Time: 12/02/14  2:25 AM  Result Value  Ref Range Status   Specimen Description URINE, CATHETERIZED  Final   Special Requests NONE  Final   Colony Count NO GROWTH Performed at [facility]   Final   Culture NO GROWTH Performed at [facility]   Final   Report Status 12/03/2014 FINAL  Final    Coagulation Studies: No results for input(s): LABPROT, INR in the last 72 hours.  Imaging: No results found.  Medications:  Scheduled: . atorvastatin  20 mg Oral Daily  . collagenase  1 application Topical Daily  . feeding supplement (GLUCERNA SHAKE)  237 mL Oral TID BM  . fentaNYL  25 mcg Transdermal Q72H  . heparin  5,000 Units Subcutaneous 3 times per day  . insulin aspart  0-9 Units Subcutaneous TID WC  . insulin glargine  25 Units Subcutaneous QHS  . levofloxacin (LEVAQUIN) IV  500 mg Intravenous Q24H  . metronidazole  500 mg Intravenous Q8H  . mirtazapine  15 mg Oral QHS  . pantoprazole  40 mg Oral Daily  . QUEtiapine  25 mg Oral TID  . sodium chloride  3 mL Intravenous Q12H  . vancomycin  1,250 mg Intravenous Q24H    Assessment/Plan:  29 year old quadriplegic with altered mental status. Has been seen in the past with the same complaints. This has been in the setting of infection as well. Patient again presenting with infection, osteomyelitis, pneumonia, bacteremia, UTI. both Haldol and Meropenem have been D/C'd with significant improvement of both AMS and movements. EEG obtained and shows no epileptiform activity.   Recommend: 1) Continue to treat underlying infections.  2) No  Further neurological recommendations.    Neurology will S/O  Etta Quill PA-C [facility] [phone]  12/06/2014, 9:54 AM

## 2014-12-07 ENCOUNTER — Ambulatory Visit: Payer: Self-pay | Admitting: Family Medicine

## 2014-12-07 LAB — GLUCOSE, CAPILLARY
GLUCOSE-CAPILLARY: 170 mg/dL — AB (ref 70–99)
Glucose-Capillary: 195 mg/dL — ABNORMAL HIGH (ref 70–99)
Glucose-Capillary: 269 mg/dL — ABNORMAL HIGH (ref 70–99)
Glucose-Capillary: 164 mg/dL — ABNORMAL HIGH (ref 70–99)

## 2014-12-07 LAB — CULTURE, BLOOD (ROUTINE X 2): CULTURE: NO GROWTH

## 2014-12-07 LAB — VANCOMYCIN, TROUGH: VANCOMYCIN TR: 11.8 ug/mL (ref 10.0–20.0)

## 2014-12-07 MED ORDER — VANCOMYCIN HCL 10 G IV SOLR
1500.0000 mg | INTRAVENOUS | Status: DC
  Administered 2014-12-07 – 2014-12-16 (×10): 1500 mg via INTRAVENOUS
  Filled 2014-12-07 (×12): qty 1500

## 2014-12-07 MED ORDER — ACETAMINOPHEN 500 MG PO TABS
1000.0000 mg | ORAL_TABLET | Freq: Four times a day (QID) | ORAL | Status: DC | PRN
  Administered 2014-12-10: 1000 mg via ORAL
  Filled 2014-12-07: qty 2

## 2014-12-07 MED ORDER — CIPROFLOXACIN IN D5W 400 MG/200ML IV SOLN: 400.0000 mg | INTRAVENOUS | Status: DC

## 2014-12-07 MED ORDER — VANCOMYCIN HCL 10 G IV SOLR: 1500.0000 mg | INTRAVENOUS | Status: DC

## 2014-12-07 NOTE — Progress Notes (Signed)
[facility] PROGRESS NOTE  Jason [name] VFI:[id number] DOB: [birth year] DOA: 12/01/2014 PCP: [name], MD  Assessment/Plan: 1. Altered mental status- resolved , patient has complicated history with pneumonia ischial osteomyelitis, Klebsiella bacteremia and Burkhoderia UTI. Infection seems to be controlled with IV antibiotics. Jason [name] has now been stopped, which can supposedly cause altered mental status. EEG was negative for seizure Neurology again recommended to change the meropenem to some other antibiotic. I called and discussed with infectious disease Dr. Michel Bickers, who recommends to stop meropenem and start Flagyl as well as Levaquin along with vancomycin. Patient's mental status improved significantly after changing the above antibiotics and starting psych medications. 2. Hallucinations -  resolved after patient was started on Seroquel 25 mg 3 times a day per psych. Continue Seroquel 25 mg 3 times a day as per psych recommendation. Patient also has been started on Remeron 7.5 mg by mouth daily  3. 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 It showed multiple food collections within the abductor magnus and abductor brevis muscles likely consistent with intramuscular abscesses. Patient was seen by plastic surgery underwent debridement on 11/12/2014 11/18/2014 and 11/25/2014 with VAC placement. Patient started on vancomycin and ertapenem as per infectious disease now the ertapenem was changed to meropenem. Blood culture urine culture has been collected. We'll follow the culture results, continue vancomycin, Flagyl, Levaquin. Will discontinue meropenem. Will start fentanyl patch 25 g every 72 hours. Patient has debridement of decubitus ulcer scheduled by plastic surgery on Wednesday. Continue antibiotics as above. 4. Chronic pain syndrome- patient's fentanyl patch restarted yesterday, continue  Percocet 1-2 tablets every 4 hours when necessary 5. Klebsiella pneumonia bacteremia- blood culture on 11/08/2014  was positive for Klebsiella pneumonia, currently patient on  Vancomycin, Levaquin and Flagyl 6. Burkholderia UTI- patient has been treated for UTI. No need to treat with more antibiotics as per ID. 7. Type 1 diabetes mellitus- we changed the Lantus to 25 units subcutaneous daily. Continue sliding scale insulin. Blood glucose is stable 8. Functional quadriplegia, neurogenic bladder- bedbound, secondary to spinal cord infarction. Stable 9. DVT prophylaxis- heparin  Code Status: Full code Family Communication: *Spoke to patient's mother at bedside Disposition Plan: Home when stable   Consultants:  None  Procedures:  *None  Antibiotics:  Vancomycin 11/2514  Meropenem 11/2514- 12/04/2014  Flagyl-12/04/2014 --  Levaquin-12/04/2014--  HPI/Subjective: 29 y.o. 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 Patient was recently hospitalized from 2/1 to 2/12. He was treated for ischial osteomyelitis, pneumonia, Klebsiella bacteremia, and Burkhoderia UTI in hospital. Patient was discharged on IV vancomycin plus ertapenem, which are supposed to be continued for total of 6 weeks per ID. Patient has been doing okay until 3 days ago when he started hallucinating. It seems that he is on the phone talking to somebody who is not there. Mother reports pt has not been sleeping at all in the past 3 days. Mother states that patient had hallucination 3 months ago, which was likely caused by morphine, since his symptoms resolved after discontinuation of morphine. This time, mother discontinued patient's Percocet, Lyrica and baclofen without any help. His mother states that patient developed new  rashes over his forehead and upper face. He has back pain. No headache, neck rigidity or photophobia. Patient denies fever, chills, cough, chest pain,  SOB, nausea, vomiting, abdominal pain, diarrhea, dysuria, urgency, frequency, hematuria or leg swelling.  In ED, patient was found to have negative CT head for acute abnormalities. Leukocytosis with WBC 13.0, negative UDS, lactate 1.06, positive urinalysis for moderate leukocytes, negative salicylate and acetaminophen level, ammonia 17, temperature normal. Electrolytes okay. Patient's admitted to inpatient for further evaluation and treatment.  Patient seen and examined, today complains that he is not getting enough food in the hospital. Mother says he likes salad. Patient pain is now better controlled after starting on fentanyl patch 25 g yesterday along with Percocet when necessary. Patient is awaiting to get  surgical procedure by plastic surgery on Wednesday  Objective: Filed Vitals:   12/07/14 0600  BP: 117/73  Pulse: 89  Temp: 98.6 F (37 C)  Resp:     Intake/Output Summary (Last 24 hours) at 12/07/14 1436 Last data filed at 12/07/14 0700  Gross per 24 hour  Intake   1540 ml  Output   4400 ml  Net  -2860 ml   Filed Weights   12/03/14 0500 12/04/14 0500 12/05/14 0500  Weight: 51.256 kg (113 lb) 54.432 kg (120 lb) 56.7 kg (125 lb)    Exam:   General:  *Appear in no acute distress  Cardiovascular: S1-S2 regular  Respiratory: Clear bilaterally  Abdomen: Soft nontender no organomegaly  Musculoskeletal: No edema  of the lower extremities  Neuro- alert oriented 3, no hallucinations  Data Reviewed: Basic Metabolic Panel:  Recent Labs Lab 12/01/14 1605 12/02/14 0410 12/03/14 0558 12/06/14 0535  NA 134* 137 137 137  K 4.6 4.8 3.8 4.3  CL 115* 109 110 112  CO2 22 24 22 21   GLUCOSE 126* 158* 149* 246*  BUN 9 6 6 12   CREATININE 0.71 0.62 0.62 0.83  CALCIUM 8.9 9.6 9.1 9.4  MG  --  1.2*  --   --    Liver Function  Tests:  Recent Labs Lab 12/01/14 1605 12/02/14 0410  AST 25 23  ALT 19 18  ALKPHOS 128* 122*  BILITOT 0.4 0.6  PROT 7.8 7.6  ALBUMIN 2.7* 2.4*   No results for input(s): LIPASE, AMYLASE in the last 168 hours.  Recent Labs Lab 12/01/14 2008  AMMONIA 17   CBC:  Recent Labs Lab 12/01/14 1605 12/02/14 0410 12/03/14 0558  WBC 13.0* 13.6* 14.2*  HGB 8.8* 8.6* 8.0*  HCT 27.7* 26.9* 25.2*  MCV 84.5 82.0 81.6  PLT 640* 621* 540*   Cardiac Enzymes: No results for input(s): CKTOTAL, CKMB, CKMBINDEX, TROPONINI in the last 168 hours. BNP (last 3 results) No results for input(s): BNP in the last 8760 hours.  ProBNP (last 3 results)  Recent Labs  01/29/14 2113  PROBNP 1343.0*    CBG:  Recent Labs Lab 12/06/14 1111 12/06/14 1651 12/06/14 2117 12/07/14 0635 12/07/14 1145  GLUCAP 206* 139* 219* 195* 170*    Recent Results (from the past 240 hour(s))  Blood culture (routine x 2)     Status: None   Collection Time: 12/01/14  4:05 PM  Result Value Ref Range Status   Specimen Description BLOOD  LAC  Final   Special Requests BOTTLES DRAWN AEROBIC AND ANAEROBIC 5ML EACH  Final   Culture   Final    NO GROWTH 5 DAYS Performed at [facility]    Report Status 12/07/2014 FINAL  Final  Urine culture     Status: None   Collection Time: 12/02/14  2:25 AM  Result Value Ref Range Status  Specimen Description URINE, CATHETERIZED  Final   Special Requests NONE  Final   Colony Count NO GROWTH Performed at [facility]   Final   Culture NO GROWTH Performed at [facility]   Final   Report Status 12/03/2014 FINAL  Final     Studies: No results found.  Scheduled Meds: . atorvastatin  20 mg Oral Daily  . collagenase  1 application Topical Daily  . feeding supplement (GLUCERNA SHAKE)  237 mL Oral TID BM  . fentaNYL  25 mcg Transdermal Q72H  . heparin  5,000 Units Subcutaneous 3 times per day  . insulin aspart  0-9 Units Subcutaneous TID WC   . insulin glargine  25 Units Subcutaneous QHS  . levofloxacin (LEVAQUIN) IV  500 mg Intravenous Q24H  . metronidazole  500 mg Intravenous Q8H  . mirtazapine  15 mg Oral QHS  . pantoprazole  40 mg Oral Daily  . QUEtiapine  25 mg Oral TID  . sodium chloride  3 mL Intravenous Q12H  . vancomycin  1,500 mg Intravenous Q24H   Continuous Infusions: . sodium chloride 100 mL/hr at 12/05/14 1419    Principal Problem:   Acute encephalopathy Active Problems:   Type 1 diabetes, uncontrolled, with neuropathy   Asthma   GERD   Gastroparesis due to DM   Protein-calorie malnutrition, severe   Spinal cord infarction (history of)   Tetraplegia   Neurogenic bladder   UTI (urinary tract infection)   Sacral pressure sore   Stage IV decubitus ulcer   Osteomyelitis, pelvis   Anemia   Burkholderia cepacia infection   Disorientation    Time spent: 20 minutes    [facility] Pager [phone]. If 7PM-7AM, please contact night-coverage at [email or web address], password [facility] 12/07/2014, 2:36 PM  LOS: 6 days

## 2014-12-07 NOTE — Consult Note (Signed)
WOC ostomy follow up Stoma type/location: LLQ, loop colostomy Stomal assessment/size: 1 1/4" round, budded from the skin  Peristomal assessment: red robinson support rod still in place from surgery 10/12/14, I have contacted surgery team today to request removal  Treatment options for stomal/peristomal skin: none needed, areas of tape blistering that mother was worried about at the time of admission have healed and are intact  Output liquid, green effluent and large amounts of gas  Ostomy pouching: 2pc. 2 3/4" used today, however I have suggested to his mom that they may be able to switch to a smaller wafer.  I have ordered both the current size and the 2 1/4" wafer to see if mother and pt may prefer this.   Education provided: Mother and pt independent with ostomy care, however today mother slept during my visit.   When I arrived pt ostomy pouch was leaking everywhere, unclear when it started leaking but I feel the pouch was overfull with both gas and stool.  Will request staff to monitor a bit closer for need to burp gas or empty.  Enrolled patient in [place] Start Discharge program: Yes previously.  WOC will follow along with you for ostomy support.  Plastic surgery is managing sacral and ischial wounds.    Lilliam Chamblee [place] [employer] RN,CWOCN [phone]

## 2014-12-07 NOTE — Progress Notes (Addendum)
ANTIBIOTIC CONSULT NOTE - FOLLOW UP  Pharmacy Consult for vancomycin Indication: Osteomyelitis  Allergies  Allergen Reactions  . Cefuroxime Axetil Anaphylaxis  . Ertapenem Other (See Comments)    Rash and confusion  . Morphine And Related Other (See Comments)    Changed mental status, confusion, headache, visual hallucination  . Penicillins Anaphylaxis and Other (See Comments)    ?can take amoxicillin?  . Sulfa Antibiotics Anaphylaxis, Shortness Of Breath and Other (See Comments)  . Tessalon [Benzonatate] Anaphylaxis  . Shellfish Allergy Itching and Other (See Comments)    Took benadryl to alleviate reaction    Patient Measurements: Height: 5\' 8"  (172.7 cm) (from 11/08/14) Weight: 125 lb (56.7 kg) IBW/kg (Calculated) : 68.4   Vital Signs: Temp: 98.6 F (37 C) (03/01 0600) Temp Source: Axillary (03/01 0600) BP: 117/73 mmHg (03/01 0600) Pulse Rate: 89 (03/01 0600) Intake/Output from previous day: 02/29 0701 - 03/01 0700 In: 1540 [P.O.:240; I.V.:1200; IV Piggyback:100] Out: 4400 [Urine:3700; Drains:100; Stool:600] Intake/Output from this shift:    Labs:  Recent Labs  12/06/14 0535  CREATININE 0.83   Estimated Creatinine Clearance: 104.4 mL/min (by C-G formula based on Cr of 0.83). No results for input(s): VANCOTROUGH, VANCOPEAK, VANCORANDOM, GENTTROUGH, GENTPEAK, GENTRANDOM, TOBRATROUGH, TOBRAPEAK, TOBRARND, AMIKACINPEAK, AMIKACINTROU, AMIKACIN in the last 72 hours.   Microbiology: Recent Results (from the past 720 hour(s))  Urine culture     Status: None   Collection Time: 11/08/14  3:13 PM  Result Value Ref Range Status   Specimen Description URINE, CATHETERIZED  Final   Special Requests NONE  Final   Colony Count   Final    >=100,000 COLONIES/ML Performed at [place]   Final    BURKHOLDERIA CEPACIA Performed at [facility]    Report Status 11/11/2014 FINAL  Final   Organism ID, Bacteria BURKHOLDERIA CEPACIA  Final   Susceptibility   Burkholderia cepacia - MIC*    LEVOFLOXACIN 4 INTERMEDIATE Intermediate     TRIMETH/SULFA 160 RESISTANT Resistant     CEFTAZIDIME 8 SENSITIVE Sensitive     * BURKHOLDERIA CEPACIA  Culture, blood (routine x 2)     Status: None   Collection Time: 11/08/14  5:25 PM  Result Value Ref Range Status   Specimen Description BLOOD ARM LEFT  Final   Special Requests BOTTLES DRAWN AEROBIC AND ANAEROBIC 10CC  Final   Culture   Final    KLEBSIELLA PNEUMONIAE Note: Culture results may be compromised due to an excessive volume of blood received in culture bottles. Gram Stain Report Called to,Read Back By and Verified With: Juanetta Beets 11/12/14 0144A [place] Performed at [facility]    Report Status 11/14/2014 FINAL  Final   Organism ID, Bacteria KLEBSIELLA PNEUMONIAE  Final      Susceptibility   Klebsiella pneumoniae - MIC*    AMPICILLIN RESISTANT      AMPICILLIN/SULBACTAM 4 SENSITIVE Sensitive     CEFAZOLIN <=4 SENSITIVE Sensitive     CEFEPIME <=1 SENSITIVE Sensitive     CEFTAZIDIME <=1 SENSITIVE Sensitive     CEFTRIAXONE <=1 SENSITIVE Sensitive     CIPROFLOXACIN <=0.25 SENSITIVE Sensitive     GENTAMICIN <=1 SENSITIVE Sensitive     IMIPENEM <=0.25 SENSITIVE Sensitive     PIP/TAZO <=4 SENSITIVE Sensitive     TOBRAMYCIN <=1 SENSITIVE Sensitive     TRIMETH/SULFA <=20 SENSITIVE Sensitive     * KLEBSIELLA PNEUMONIAE  Culture, blood (routine x 2)     Status: None  Collection Time: 11/08/14  5:35 PM  Result Value Ref Range Status   Specimen Description BLOOD ARM LEFT  Final   Special Requests BOTTLES DRAWN AEROBIC AND ANAEROBIC 10CC  Final   Culture   Final    KLEBSIELLA PNEUMONIAE Note: SUSCEPTIBILITIES PERFORMED ON PREVIOUS CULTURE WITHIN THE LAST 5 DAYS. Gram Stain Report Called to,Read Back By and Verified With: Iran Sizer RN @ [facility] 11/12/14 Performed at [facility]    Report Status 11/14/2014 FINAL  Final  Clostridium Difficile by PCR     Status:  None   Collection Time: 11/12/14  8:41 AM  Result Value Ref Range Status   C difficile by pcr NEGATIVE NEGATIVE Final  Culture, blood (routine x 2)     Status: None   Collection Time: 11/12/14  8:04 PM  Result Value Ref Range Status   Specimen Description BLOOD LEFT ARM  Final   Special Requests BOTTLES DRAWN AEROBIC AND ANAEROBIC 10CC  Final   Culture   Final    NO GROWTH 5 DAYS Note: Culture results may be compromised due to an excessive volume of blood received in culture bottles. Performed at [facility]    Report Status 11/19/2014 FINAL  Final  Culture, blood (routine x 2)     Status: None   Collection Time: 11/12/14  8:19 PM  Result Value Ref Range Status   Specimen Description BLOOD LEFT ARM  Final   Special Requests   Final    BOTTLES DRAWN AEROBIC AND ANAEROBIC BLUE 10CC RED 5CC   Culture   Final    NO GROWTH 5 DAYS Performed at [facility]    Report Status 11/19/2014 FINAL  Final  Body fluid culture     Status: None   Collection Time: 11/15/14 11:03 AM  Result Value Ref Range Status   Specimen Description FLUID HIP RIGHT SYNOVIAL  Final   Special Requests NONE  Final   Gram Stain   Final    RARE WBC PRESENT, PREDOMINANTLY MONONUCLEAR NO ORGANISMS SEEN Performed at [facility]    Culture   Final    NO GROWTH 3 DAYS Performed at [facility]    Report Status 11/19/2014 FINAL  Final  Blood Cultures x 2 sites     Status: None   Collection Time: 11/16/14  7:00 PM  Result Value Ref Range Status   Specimen Description BLOOD RIGHT ARM  Final   Special Requests BOTTLES DRAWN AEROBIC ONLY 10CC  Final   Culture   Final    NO GROWTH 5 DAYS Performed at [facility]    Report Status 11/23/2014 FINAL  Final  Blood Cultures x 2 sites     Status: None   Collection Time: 11/16/14  7:12 PM  Result Value Ref Range Status   Specimen Description BLOOD LEFT HAND  Final   Special Requests BOTTLES DRAWN AEROBIC ONLY 1CC  Final    Culture   Final    NO GROWTH 5 DAYS Performed at [facility]    Report Status 11/23/2014 FINAL  Final  Blood culture (routine x 2)     Status: None   Collection Time: 12/01/14  4:05 PM  Result Value Ref Range Status   Specimen Description BLOOD  LAC  Final   Special Requests BOTTLES DRAWN AEROBIC AND ANAEROBIC 5ML EACH  Final   Culture   Final    NO GROWTH 5 DAYS Performed at [facility]  Report Status 12/07/2014 FINAL  Final  Urine culture     Status: None   Collection Time: 12/02/14  2:25 AM  Result Value Ref Range Status   Specimen Description URINE, CATHETERIZED  Final   Special Requests NONE  Final   Colony Count NO GROWTH Performed at [facility]   Final   Culture NO GROWTH Performed at [facility]   Final   Report Status 12/03/2014 FINAL  Final    Anti-infectives    Start     Dose/Rate Route Frequency Ordered Stop   12/04/14 1300  levofloxacin (LEVAQUIN) IVPB 500 mg     500 mg 100 mL/hr over 60 Minutes Intravenous Every 24 hours 12/04/14 1108     12/04/14 1200  metroNIDAZOLE (FLAGYL) IVPB 500 mg     500 mg 100 mL/hr over 60 Minutes Intravenous Every 8 hours 12/04/14 1108     12/03/14 0800  vancomycin (VANCOCIN) 1,250 mg in sodium chloride 0.9 % 250 mL IVPB     1,250 mg 166.7 mL/hr over 90 Minutes Intravenous Every 24 hours 12/02/14 1158     12/02/14 1130  vancomycin (VANCOCIN) IVPB 1000 mg/200 mL premix  Status:  Discontinued     1,000 mg 200 mL/hr over 60 Minutes Intravenous Every 24 hours 12/02/14 0119 12/02/14 1155   12/02/14 0300  meropenem (MERREM) 1 g in sodium chloride 0.9 % 100 mL IVPB  Status:  Discontinued     1 g 200 mL/hr over 30 Minutes Intravenous 3 times per day 12/02/14 0212 12/04/14 1108      Assessment: 28 YOM continues on vancomycin, levofloxacin, and metronidazole for osteomyelitis. His renal function has been stable, but would be cautious with interpreting CrCL given quadriplegic status. S/p VAC  placement on 2/18 with plan to take him back to the  OR on Wednesday for debridement of decubitus ulcer.  Last vancomycin trough level is below goal at 11.3 with dose increased to 1250mg  IV q24h.  Vanc PTA >> (initally planning 6 weeks from 2/5) Merrem 2/25 >> 2/27 LVQ 2/27 >> Flagyl 2/27 >>  2/25 VT = 11.3 mcg/mL (1gm q24h)  Goal of Therapy:  Vancomycin trough level 15-20 mcg/ml  Plan:  - Continue vanc 1250mg  IV Q24H - Continue LVQ 500mg  IV Q24H and Flagyl 500mg  IV Q8H as ordered - Monitor renal fxn, clinical progress - will check vancomycin trough level today to assess current regimen   Jason [name] P 12/07/2014,10:19 AM  Adden (12/07/14 at 1400): vancomycin trough level now back sub-therapeutic at 11.8.  Will increase dose to 1500mg  IV q24h Jason [name], PharmD, BCPS

## 2014-12-08 ENCOUNTER — Encounter (HOSPITAL_COMMUNITY)
Admission: EM | Disposition: A | Discharge status: Home Health | Payer: Self-pay | Source: Home / Self Care | Attending: Pulmonary Disease

## 2014-12-08 ENCOUNTER — Encounter (HOSPITAL_COMMUNITY): Payer: Self-pay | Admitting: Plastic Surgery

## 2014-12-08 ENCOUNTER — Inpatient Hospital Stay (HOSPITAL_COMMUNITY): Payer: Medicaid Other | Admitting: Anesthesiology

## 2014-12-08 HISTORY — PX: INCISION AND DRAINAGE OF WOUND: SHX1803

## 2014-12-08 HISTORY — PX: APPLICATION OF A-CELL OF BACK: SHX6301

## 2014-12-08 LAB — GLUCOSE, CAPILLARY
GLUCOSE-CAPILLARY: 184 mg/dL — AB (ref 70–99)
GLUCOSE-CAPILLARY: 88 mg/dL (ref 70–99)
GLUCOSE-CAPILLARY: 53 mg/dL — AB (ref 70–99)
GLUCOSE-CAPILLARY: 78 mg/dL (ref 70–99)
Glucose-Capillary: 80 mg/dL (ref 70–99)
Glucose-Capillary: 22 mg/dL — CL (ref 70–99)
Glucose-Capillary: 50 mg/dL — ABNORMAL LOW (ref 70–99)
Glucose-Capillary: 150 mg/dL — ABNORMAL HIGH (ref 70–99)
Glucose-Capillary: 108 mg/dL — ABNORMAL HIGH (ref 70–99)

## 2014-12-08 LAB — BASIC METABOLIC PANEL
Anion gap: 3 — ABNORMAL LOW (ref 5–15)
BUN: 8 mg/dL (ref 6–23)
CALCIUM: 9.2 mg/dL (ref 8.4–10.5)
CO2: 22 mmol/L (ref 19–32)
Chloride: 112 mmol/L (ref 96–112)
Creatinine, Ser: 0.62 mg/dL (ref 0.50–1.35)
GFR calc Af Amer: >90 mL/min (ref >90)
GLUCOSE: 86 mg/dL (ref 70–99)
Potassium: 3.8 mmol/L (ref 3.5–5.1)
Sodium: 137 mmol/L (ref 135–145)

## 2014-12-08 LAB — CBC
HCT: 22.8 % — ABNORMAL LOW (ref 39.0–52.0)
HEMOGLOBIN: 7.3 g/dL — AB (ref 13.0–17.0)
MCH: 26.9 pg (ref 26.0–34.0)
MCHC: 32 g/dL (ref 30.0–36.0)
MCV: 84.1 fL (ref 78.0–100.0)
PLATELETS: 416 10*3/uL — AB (ref 150–400)
RBC: 2.71 MIL/uL — ABNORMAL LOW (ref 4.22–5.81)
RDW: 17.3 % — ABNORMAL HIGH (ref 11.5–15.5)
WBC: 13.5 10*3/uL — ABNORMAL HIGH (ref 4.0–10.5)

## 2014-12-08 LAB — SURGICAL PCR SCREEN
MRSA, PCR: NEGATIVE
STAPHYLOCOCCUS AUREUS: NEGATIVE

## 2014-12-08 SURGERY — IRRIGATION AND DEBRIDEMENT WOUND
Anesthesia: General | Laterality: Right

## 2014-12-08 MED ORDER — FENTANYL CITRATE 0.05 MG/ML IJ SOLN
INTRAMUSCULAR | Status: DC | PRN
  Administered 2014-12-08: 150 ug via INTRAVENOUS

## 2014-12-08 MED ORDER — CHLORHEXIDINE GLUCONATE 0.12 % MT SOLN
15.0000 mL | Freq: Two times a day (BID) | OROMUCOSAL | Status: DC
  Administered 2014-12-09 – 2014-12-18 (×16): 15 mL via OROMUCOSAL
  Filled 2014-12-08 (×23): qty 15

## 2014-12-08 MED ORDER — BACITRACIN ZINC 500 UNIT/GM EX OINT
TOPICAL_OINTMENT | CUTANEOUS | Status: AC
  Filled 2014-12-08: qty 28.35

## 2014-12-08 MED ORDER — HYDROMORPHONE HCL 1 MG/ML IJ SOLN
INTRAMUSCULAR | Status: AC
  Filled 2014-12-08: qty 1

## 2014-12-08 MED ORDER — LACTATED RINGERS IV SOLN
INTRAVENOUS | Status: DC | PRN
  Administered 2014-12-08: 16:00:00 via INTRAVENOUS

## 2014-12-08 MED ORDER — ROCURONIUM BROMIDE 50 MG/5ML IV SOLN
INTRAVENOUS | Status: AC
  Filled 2014-12-08: qty 1

## 2014-12-08 MED ORDER — HYDROMORPHONE HCL 1 MG/ML IJ SOLN
0.2500 mg | INTRAMUSCULAR | Status: DC | PRN
  Administered 2014-12-08 (×4): 0.5 mg via INTRAVENOUS

## 2014-12-08 MED ORDER — SODIUM CHLORIDE 0.9 % IR SOLN
Status: DC | PRN
  Administered 2014-12-08: 1

## 2014-12-08 MED ORDER — MIDAZOLAM HCL 5 MG/5ML IJ SOLN
INTRAMUSCULAR | Status: DC | PRN
  Administered 2014-12-08: 2 mg via INTRAVENOUS

## 2014-12-08 MED ORDER — PHENYLEPHRINE HCL 10 MG/ML IJ SOLN
INTRAMUSCULAR | Status: DC | PRN
  Administered 2014-12-08: 80 ug via INTRAVENOUS

## 2014-12-08 MED ORDER — MUPIROCIN 2 % EX OINT
1.0000 "application " | TOPICAL_OINTMENT | CUTANEOUS | Status: DC
  Administered 2014-12-08 – 2014-12-15 (×3): 1 via NASAL
  Filled 2014-12-08 (×2): qty 22

## 2014-12-08 MED ORDER — BACITRACIN ZINC 500 UNIT/GM EX OINT
TOPICAL_OINTMENT | CUTANEOUS | Status: DC | PRN
  Administered 2014-12-08: 1 via TOPICAL

## 2014-12-08 MED ORDER — LACTATED RINGERS IV SOLN
INTRAVENOUS | Status: DC
  Administered 2014-12-08 – 2014-12-15 (×4): via INTRAVENOUS

## 2014-12-08 MED ORDER — FENTANYL CITRATE 0.05 MG/ML IJ SOLN
INTRAMUSCULAR | Status: AC
  Filled 2014-12-08: qty 5

## 2014-12-08 MED ORDER — GLYCOPYRROLATE 0.2 MG/ML IJ SOLN
INTRAMUSCULAR | Status: DC | PRN
  Administered 2014-12-08: 0.6 mg via INTRAVENOUS

## 2014-12-08 MED ORDER — SODIUM CHLORIDE 0.9 % IR SOLN
Status: DC | PRN
  Administered 2014-12-08: 500 mL

## 2014-12-08 MED ORDER — DEXTROSE 50 % IV SOLN
INTRAVENOUS | Status: AC
  Filled 2014-12-08: qty 50

## 2014-12-08 MED ORDER — ROCURONIUM BROMIDE 100 MG/10ML IV SOLN
INTRAVENOUS | Status: DC | PRN
  Administered 2014-12-08: 40 mg via INTRAVENOUS

## 2014-12-08 MED ORDER — HYDROMORPHONE HCL 1 MG/ML IJ SOLN
INTRAMUSCULAR | Status: AC
  Administered 2014-12-08: 0.5 mg via INTRAVENOUS
  Filled 2014-12-08: qty 1

## 2014-12-08 MED ORDER — NEOSTIGMINE METHYLSULFATE 10 MG/10ML IV SOLN
INTRAVENOUS | Status: DC | PRN
  Administered 2014-12-08: 4 mg via INTRAVENOUS

## 2014-12-08 MED ORDER — LIDOCAINE HCL 2 % EX GEL
1.0000 "application " | CUTANEOUS | Status: DC
  Administered 2014-12-10 – 2014-12-16 (×3): 1 via TOPICAL
  Filled 2014-12-08 (×10): qty 5

## 2014-12-08 MED ORDER — DEXTROSE 50 % IV SOLN
1.0000 | Freq: Once | INTRAVENOUS | Status: AC
  Administered 2014-12-08: 50 mL via INTRAVENOUS

## 2014-12-08 MED ORDER — ONDANSETRON HCL 4 MG/2ML IJ SOLN
INTRAMUSCULAR | Status: DC | PRN
  Administered 2014-12-08: 4 mg via INTRAVENOUS

## 2014-12-08 MED ORDER — ONDANSETRON HCL 4 MG/2ML IJ SOLN
INTRAMUSCULAR | Status: AC
  Filled 2014-12-08: qty 2

## 2014-12-08 MED ORDER — MIDAZOLAM HCL 2 MG/2ML IJ SOLN
INTRAMUSCULAR | Status: AC
  Filled 2014-12-08: qty 2

## 2014-12-08 MED ORDER — PROTEIN PO POWD
1.0000 | Freq: Two times a day (BID) | ORAL | Status: DC
Start: 2014-12-08 — End: 2014-12-08

## 2014-12-08 MED ORDER — ONDANSETRON HCL 4 MG/2ML IJ SOLN: 4.0000 mg | Freq: Once | INTRAMUSCULAR | Status: DC | PRN

## 2014-12-08 MED ORDER — MEPERIDINE HCL 25 MG/ML IJ SOLN: 6.2500 mg | INTRAMUSCULAR | Status: DC | PRN

## 2014-12-08 MED ORDER — LIDOCAINE HCL (CARDIAC) 20 MG/ML IV SOLN
INTRAVENOUS | Status: AC
  Filled 2014-12-08: qty 5

## 2014-12-08 MED ORDER — GLYCOPYRROLATE 0.2 MG/ML IJ SOLN
INTRAMUSCULAR | Status: AC
  Filled 2014-12-08: qty 4

## 2014-12-08 MED ORDER — PRO-STAT SUGAR FREE PO LIQD
30.0000 mL | Freq: Two times a day (BID) | ORAL | Status: DC
  Administered 2014-12-08 – 2014-12-15 (×10): 30 mL via ORAL
  Filled 2014-12-08 (×16): qty 30

## 2014-12-08 MED ORDER — PROPOFOL 10 MG/ML IV BOLUS
INTRAVENOUS | Status: AC
  Filled 2014-12-08: qty 20

## 2014-12-08 MED ORDER — CETYLPYRIDINIUM CHLORIDE 0.05 % MT LIQD
7.0000 mL | Freq: Two times a day (BID) | OROMUCOSAL | Status: DC
  Administered 2014-12-10 – 2014-12-17 (×12): 7 mL via OROMUCOSAL

## 2014-12-08 MED ORDER — LIDOCAINE HCL (CARDIAC) 20 MG/ML IV SOLN
INTRAVENOUS | Status: DC | PRN
  Administered 2014-12-08: 100 mg via INTRAVENOUS

## 2014-12-08 MED ORDER — MIDODRINE HCL 5 MG PO TABS
5.0000 mg | ORAL_TABLET | ORAL | Status: DC | PRN
  Filled 2014-12-08: qty 1

## 2014-12-08 MED ORDER — PHENYLEPHRINE 40 MCG/ML (10ML) SYRINGE FOR IV PUSH (FOR BLOOD PRESSURE SUPPORT)
PREFILLED_SYRINGE | INTRAVENOUS | Status: AC
  Filled 2014-12-08: qty 10

## 2014-12-08 MED ORDER — NEOSTIGMINE METHYLSULFATE 10 MG/10ML IV SOLN
INTRAVENOUS | Status: AC
  Filled 2014-12-08: qty 1

## 2014-12-08 MED ORDER — PROPOFOL 10 MG/ML IV BOLUS
INTRAVENOUS | Status: DC | PRN
  Administered 2014-12-08: 100 mg via INTRAVENOUS

## 2014-12-08 SURGICAL SUPPLY — 45 items
BAG DECANTER FOR FLEXI CONT (MISCELLANEOUS) IMPLANT
BLADE 10 SAFETY STRL DISP (BLADE) ×4 IMPLANT
BLADE SURG ROTATE 9660 (MISCELLANEOUS) IMPLANT
BNDG GAUZE ELAST 4 BULKY (GAUZE/BANDAGES/DRESSINGS) IMPLANT
CANISTER SUCTION 2500CC (MISCELLANEOUS) ×4 IMPLANT
CANISTER WOUND CARE 500ML ATS (WOUND CARE) ×4 IMPLANT
CHLORAPREP W/TINT 26ML (MISCELLANEOUS) IMPLANT
CONT SPEC STER OR (MISCELLANEOUS) IMPLANT
COVER SURGICAL LIGHT HANDLE (MISCELLANEOUS) ×4 IMPLANT
DRAPE IMP U-DRAPE 54X76 (DRAPES) ×4 IMPLANT
DRAPE INCISE IOBAN 66X45 STRL (DRAPES) IMPLANT
DRAPE PED LAPAROTOMY (DRAPES) ×4 IMPLANT
DRAPE PROXIMA HALF (DRAPES) IMPLANT
DRSG ADAPTIC 3X8 NADH LF (GAUZE/BANDAGES/DRESSINGS) IMPLANT
DRSG PAD ABDOMINAL 8X10 ST (GAUZE/BANDAGES/DRESSINGS) ×8 IMPLANT
DRSG VAC ATS LRG SENSATRAC (GAUZE/BANDAGES/DRESSINGS) IMPLANT
DRSG VAC ATS MED SENSATRAC (GAUZE/BANDAGES/DRESSINGS) ×4 IMPLANT
DRSG VAC ATS SM SENSATRAC (GAUZE/BANDAGES/DRESSINGS) IMPLANT
ELECT CAUTERY BLADE 6.4 (BLADE) ×4 IMPLANT
ELECT REM PT RETURN 9FT ADLT (ELECTROSURGICAL) ×4
ELECTRODE REM PT RTRN 9FT ADLT (ELECTROSURGICAL) ×2 IMPLANT
GAUZE SPONGE 4X4 12PLY STRL (GAUZE/BANDAGES/DRESSINGS) IMPLANT
GAUZE XEROFORM 5X9 LF (GAUZE/BANDAGES/DRESSINGS) ×4 IMPLANT
GLOVE BIO SURGEON STRL SZ 6.5 (GLOVE) ×3 IMPLANT
GLOVE BIO SURGEONS STRL SZ 6.5 (GLOVE) ×1
GOWN STRL REUS W/ TWL LRG LVL3 (GOWN DISPOSABLE) ×4 IMPLANT
GOWN STRL REUS W/TWL LRG LVL3 (GOWN DISPOSABLE) ×4
KIT BASIN OR (CUSTOM PROCEDURE TRAY) ×4 IMPLANT
KIT ROOM TURNOVER OR (KITS) ×4 IMPLANT
MATRIX SURGICAL PSM 7X10CM (Tissue) ×4 IMPLANT
MICROMATRIX 1000MG (Tissue) ×4 IMPLANT
NS IRRIG 1000ML POUR BTL (IV SOLUTION) ×4 IMPLANT
PACK GENERAL/GYN (CUSTOM PROCEDURE TRAY) ×4 IMPLANT
PACK UNIVERSAL I (CUSTOM PROCEDURE TRAY) ×4 IMPLANT
PAD ARMBOARD 7.5X6 YLW CONV (MISCELLANEOUS) ×8 IMPLANT
SOLUTION PARTIC MCRMTRX 1000MG (Tissue) ×2 IMPLANT
SURGILUBE 2OZ TUBE FLIPTOP (MISCELLANEOUS) IMPLANT
SUT VIC AB 5-0 PS2 18 (SUTURE) IMPLANT
SWAB COLLECTION DEVICE MRSA (MISCELLANEOUS) IMPLANT
SYR BULB IRRIGATION 50ML (SYRINGE) ×4 IMPLANT
TAPE CLOTH SURG 4X10 WHT LF (GAUZE/BANDAGES/DRESSINGS) ×8 IMPLANT
TOWEL OR 17X24 6PK STRL BLUE (TOWEL DISPOSABLE) ×4 IMPLANT
TOWEL OR 17X26 10 PK STRL BLUE (TOWEL DISPOSABLE) ×4 IMPLANT
TUBE ANAEROBIC SPECIMEN COL (MISCELLANEOUS) IMPLANT
UNDERPAD 30X30 INCONTINENT (UNDERPADS AND DIAPERS) ×4 IMPLANT

## 2014-12-08 NOTE — Progress Notes (Signed)
[facility] PROGRESS NOTE  Jason [name] VQQ:[id number] DOB: Jan 22, 1986 DOA: 12/01/2014 PCP: [name], MD  Assessment/Plan: 1. Altered mental status- improving , patient has complicated history with pneumonia ischial osteomyelitis, Klebsiella bacteremia and Burkhoderia UTI. Infection seems to be controlled with IV antibiotics. [name] has now been stopped, which can supposedly cause altered mental status. EEG has been ordered. Neurology again recommended to change the meropenem to some other antibiotic. I called and discussed with infectious disease Dr. Michel Bickers, who recommends to stop meropenem and start Flagyl as well as Levaquin along with vancomycin. 2. Hallucinations - improved after patient was started on Seroquel 25 mg 3 times a day per psych. Patient's mom requested to stop prn haldol and ativan. Patient's mom reported good response from seroquel and remeron 3. 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 It showed multiple food collections within the abductor magnus and abductor brevis muscles likely consistent with intramuscular abscesses. Patient was seen by plastic surgery underwent debridement on 11/12/2014 11/18/2014 and 11/25/2014 with VAC placement. Patient started on vancomycin and ertapenem as per infectious disease now the ertapenem was changed to meropenem. Blood culture urine culture has been collected. We'll follow the culture results, continue vancomycin, Flagyl, Levaquin. Will discontinue meropenem. Will start fentanyl patch 25 g every 72 hours. Patient has debridement of decubitus ulcer scheduled by plastic surgery on Wednesday 4. Klebsiella pneumonia bacteremia- blood culture  was positive for Klebsiella pneumonia, currently patient on meropenem and vancomycin 5. Burkholderia UTI- patient has been treated for UTI. No need to treat with more antibiotics as per ID. 6. Type 1  diabetes mellitus- will change the Lantus to 25 units subcutaneous daily. Continue sliding scale insulin 7. Functional quadriplegia, neurogenic bladder- bedbound, secondary to spinal cord infarction. 8. DVT prophylaxis- heparin  Code Status: Full code Family Communication: *Spoke to patient's mother at bedside Disposition Plan: Home when stable   Consultants:  Psychiatry  Neurology  Plastic surgery  Phone coversation with ID  Procedures:  Pending debridement of decubitus ulcer  Antibiotics:  Vancomycin/flagyl/levaquin  Meropenem stopped  HPI/Subjective: 29 y.o. 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 Patient was recently hospitalized from 2/1 to 2/12. He was treated for ischial osteomyelitis, pneumonia, Klebsiella bacteremia, and Burkhoderia UTI in hospital. Patient was discharged on IV vancomycin plus ertapenem, which are supposed to be continued for total of 6 weeks per ID. Patient has been doing okay until 3 days ago when he started hallucinating. It seems that he is on the phone talking to somebody who is not there. Mother reports pt has not been sleeping at all in the past 3 days. Mother states that patient had hallucination 3 months ago, which was likely caused by morphine, since his symptoms resolved after discontinuation of morphine. This time, mother discontinued patient's Percocet, Lyrica and baclofen without any help. His mother states that patient developed new rashes over his forehead and upper face. He has back pain. No headache, neck rigidity or photophobia. Patient denies fever, chills, cough, chest pain, SOB, nausea, vomiting, abdominal pain, diarrhea, dysuria, urgency, frequency, hematuria or leg swelling.  In ED, patient was found to have negative CT head for  acute abnormalities. Leukocytosis with WBC 13.0, negative UDS, lactate 1.06, positive urinalysis for moderate leukocytes, negative salicylate and acetaminophen level, ammonia 17,  temperature normal. Electrolytes okay. Patient's admitted to inpatient for further evaluation and treatment.  Patient seen and examined, sleepy, mom at bedside, reported patient has chronic back pain, some skin irritation around clostomy bag, mom reported patient states seeing dog in his room last night, but overall, mental status has improved, mom requested stop prn haldol/ativan. Npo awaiting for surgery today.  Objective: Filed Vitals:   12/08/14 0452  BP: 116/71  Pulse: 87  Temp: 98.6 F (37 C)  Resp: 20    Intake/Output Summary (Last 24 hours) at 12/08/14 1105 Last data filed at 12/08/14 0900  Gross per 24 hour  Intake   1760 ml  Output   6575 ml  Net  -4815 ml   Filed Weights   12/04/14 0500 12/05/14 0500 12/08/14 0635  Weight: 54.432 kg (120 lb) 56.7 kg (125 lb) 56.7 kg (125 lb)    Exam:   General:  *Appear in no acute distress, confused, sleepy, does not cooperate with exam  Cardiovascular: S1-S2 regular  Respiratory: Clear bilaterally  Abdomen: Soft nontender no organomegaly, colostomy bag in place refuse to be examined.  Musculoskeletal: No edema    Data Reviewed: Basic Metabolic Panel:  Recent Labs Lab 12/01/14 1605 12/02/14 0410 12/03/14 0558 12/06/14 0535 12/08/14 0514  NA 134* 137 137 137 137  K 4.6 4.8 3.8 4.3 3.8  CL 115* 109 110 112 112  CO2 22 24 22 21 22   GLUCOSE 126* 158* 149* 246* 86  BUN 9 6 6 12 8   CREATININE 0.71 0.62 0.62 0.83 0.62  CALCIUM 8.9 9.6 9.1 9.4 9.2  MG  --  1.2*  --   --   --    Liver Function Tests:  Recent Labs Lab 12/01/14 1605 12/02/14 0410  AST 25 23  ALT 19 18  ALKPHOS 128* 122*  BILITOT 0.4 0.6  PROT 7.8 7.6  ALBUMIN 2.7* 2.4*   No results for input(s): LIPASE, AMYLASE in the last 168 hours.  Recent Labs Lab 12/01/14 2008   AMMONIA 17   CBC:  Recent Labs Lab 12/01/14 1605 12/02/14 0410 12/03/14 0558 12/08/14 0514  WBC 13.0* 13.6* 14.2* 13.5*  HGB 8.8* 8.6* 8.0* 7.3*  HCT 27.7* 26.9* 25.2* 22.8*  MCV 84.5 82.0 81.6 84.1  PLT 640* 621* 540* 416*   Cardiac Enzymes: No results for input(s): CKTOTAL, CKMB, CKMBINDEX, TROPONINI in the last 168 hours. BNP (last 3 results) No results for input(s): BNP in the last 8760 hours.  ProBNP (last 3 results)  Recent Labs  01/29/14 2113  PROBNP 1343.0*    CBG:  Recent Labs Lab 12/07/14 0635 12/07/14 1145 12/07/14 1639 12/07/14 2128 12/08/14 0634  GLUCAP 195* 170* 269* 164* 80    Recent Results (from the past 240 hour(s))  Blood culture (routine x 2)     Status: None   Collection Time: 12/01/14  4:05 PM  Result Value Ref Range Status   Specimen Description BLOOD  LAC  Final   Special Requests BOTTLES DRAWN AEROBIC AND ANAEROBIC 5ML EACH  Final   Culture   Final    NO GROWTH 5 DAYS Performed at [facility]    Report Status 12/07/2014 FINAL  Final  Urine culture     Status: None   Collection Time: 12/02/14  2:25 AM  Result Value Ref Range Status   Specimen Description URINE, CATHETERIZED  Final   Special Requests NONE  Final   Colony Count NO GROWTH Performed at [facility]   Final  Culture NO GROWTH Performed at [facility]   Final   Report Status 12/03/2014 FINAL  Final     Studies: No results found.  Scheduled Meds: . antiseptic oral rinse  7 mL Mouth Rinse q12n4p  . atorvastatin  20 mg Oral Daily  . chlorhexidine  15 mL Mouth Rinse BID  . collagenase  1 application Topical Daily  . feeding supplement (GLUCERNA SHAKE)  237 mL Oral TID BM  . fentaNYL  25 mcg Transdermal Q72H  . heparin  5,000 Units Subcutaneous 3 times per day  . insulin aspart  0-9 Units Subcutaneous TID WC  . insulin glargine  25 Units Subcutaneous QHS  . levofloxacin (LEVAQUIN) IV  500 mg Intravenous Q24H  . metronidazole  500  mg Intravenous Q8H  . mirtazapine  15 mg Oral QHS  . pantoprazole  40 mg Oral Daily  . QUEtiapine  25 mg Oral TID  . sodium chloride  3 mL Intravenous Q12H  . vancomycin  1,500 mg Intravenous Q24H   Continuous Infusions: . sodium chloride 100 mL/hr at 12/08/14 7510    Principal Problem:   Acute encephalopathy Active Problems:   Type 1 diabetes, uncontrolled, with neuropathy   Asthma   GERD   Gastroparesis due to DM   Protein-calorie malnutrition, severe   Spinal cord infarction (history of)   Tetraplegia   Neurogenic bladder   UTI (urinary tract infection)   Sacral pressure sore   Stage IV decubitus ulcer   Osteomyelitis, pelvis   Anemia   Burkholderia cepacia infection   Disorientation      Keandria Berrocal  [facility] Pager [phone]. If 7PM-7AM, please contact night-coverage at [email or web address], password [facility] 12/08/2014, 11:05 AM  LOS: 7 days

## 2014-12-08 NOTE — Anesthesia Preprocedure Evaluation (Signed)
Anesthesia Evaluation  Patient identified by MRN, date of birth, ID band Patient awake    Reviewed: Allergy & Precautions, Patient's Chart, lab work & pertinent test results  Airway Mallampati: I  TM Distance: >3 FB Neck ROM: Full    Dental   Pulmonary former smoker,          Cardiovascular     Neuro/Psych    GI/Hepatic   Endo/Other  diabetes, Type 2, Insulin Dependent, Oral Hypoglycemic Agents  Renal/GU      Musculoskeletal   Abdominal   Peds  Hematology   Anesthesia Other Findings   Reproductive/Obstetrics                             Anesthesia Physical Anesthesia Plan  ASA: III  Anesthesia Plan: General   Post-op Pain Management:    Induction: Intravenous  Airway Management Planned: LMA  Additional Equipment:   Intra-op Plan:   Post-operative Plan: Extubation in OR  Informed Consent: I have reviewed the patients History and Physical, chart, labs and discussed the procedure including the risks, benefits and alternatives for the proposed anesthesia with the patient or authorized representative who has indicated his/her understanding and acceptance.     Plan Discussed with: CRNA and Surgeon  Anesthesia Plan Comments:         Anesthesia Quick Evaluation

## 2014-12-08 NOTE — Anesthesia Postprocedure Evaluation (Signed)
  Anesthesia Post-op Note  Patient: Jason [name]  Procedure(s) Performed: Procedure(s): IRRIGATION AND DEBRIDEMENT SACRAL WOUND AND RIGHT ISCHIAL WOUND  (Right) APPLICATION OF A-CELL AND WOUND VAC  (N/A)  Patient Location: PACU  Anesthesia Type: General   Level of Consciousness: awake, alert  and oriented  Airway and Oxygen Therapy: Patient Spontanous Breathing  Post-op Pain: mild  Post-op Assessment: Post-op Vital signs reviewed  Post-op Vital Signs: Reviewed  Last Vitals:  Filed Vitals:   12/08/14 1745  BP: 114/70  Pulse: 66  Temp:   Resp: 13    Complications: No apparent anesthesia complications

## 2014-12-08 NOTE — H&P (View-Only) (Signed)
  Subjective: The patient is resting in bed and mom is at bedside.  He is very confused but answers to his name.    Objective: Vital signs in last 24 hours: Temp:  [97.5 F (36.4 C)-98.7 F (37.1 C)] 97.5 F (36.4 C) (02/27 0629) Pulse Rate:  [78-109] 109 (02/27 0629) Resp:  [16-18] 18 (02/27 0629) BP: (107-185)/(64-88) 107/64 mmHg (02/27 0629) SpO2:  [100 %] 100 % (02/27 0629) Weight:  [54.432 kg (120 lb)] 54.432 kg (120 lb) (02/27 0500) Last BM Date: 12/03/14  Intake/Output from previous day: 02/26 0701 - 02/27 0700 In: 1190 [P.O.:840; IV Piggyback:350] Out: 7673 [Urine:3350; Stool:2875] Intake/Output this shift:    No acute distress but is confused. VAC in place   Lab Results:   Recent Labs  12/02/14 0410 12/03/14 0558  WBC 13.6* 14.2*  HGB 8.6* 8.0*  HCT 26.9* 25.2*  PLT 621* 540*   BMET  Recent Labs  12/02/14 0410 12/03/14 0558  NA 137 137  K 4.8 3.8  CL 109 110  CO2 24 22  GLUCOSE 158* 149*  BUN 6 6  CREATININE 0.62 0.62  CALCIUM 9.6 9.1   PT/INR  Recent Labs  12/02/14 0410  LABPROT 14.3  INR 1.10   ABG  Recent Labs  12/01/14 1755  PHART 7.342*  HCO3 18.9*    Studies/Results: No results found.  Anti-infectives: Anti-infectives    Start     Dose/Rate Route Frequency Ordered Stop   12/03/14 0800  vancomycin (VANCOCIN) 1,250 mg in sodium chloride 0.9 % 250 mL IVPB     1,250 mg 166.7 mL/hr over 90 Minutes Intravenous Every 24 hours 12/02/14 1158     12/02/14 1130  vancomycin (VANCOCIN) IVPB 1000 mg/200 mL premix  Status:  Discontinued     1,000 mg 200 mL/hr over 60 Minutes Intravenous Every 24 hours 12/02/14 0119 12/02/14 1155   12/02/14 0300  meropenem (MERREM) 1 g in sodium chloride 0.9 % 100 mL IVPB     1 g 200 mL/hr over 30 Minutes Intravenous 3 times per day 12/02/14 0212        Assessment/Plan: s/p Procedure(s): IRRIGATION AND DEBRIDEMENT SACRAL WOUND AND RIGHT ISCHIAL WOUND  (Right) APPLICATION OF A-CELL AND WOUND  VAC  (N/A) Plan for return to OR next week for further Acell VAC placement.  LOS: 3 days    [facility] 12/04/2014

## 2014-12-08 NOTE — Progress Notes (Signed)
Hypoglycemic Event  CBG: 53 50  Treatment: D50 IV 50 mL  Symptoms: None  Follow-up CBG: Time:1722  CBG Result:150   Possible Reasons for Event: Inadequate meal intake  Comments/MD notified:none    Jason [name]  Remember to initiate Hypoglycemia Order Set & complete

## 2014-12-08 NOTE — Progress Notes (Signed)
Hypoglycemic Event  CBG: 22  Treatment: D50 IV 50 mL  Symptoms: Shaky  Follow-up CBG: Time:1130 CBG Result:184  Possible Reasons for Event: Inadequate meal intake  Comments/MD notified:initiated hypoglycemic protocol    Jason [name] P  Remember to initiate Hypoglycemia Order Set & complete

## 2014-12-08 NOTE — Brief Op Note (Signed)
12/01/2014 - 12/08/2014  4:51 PM  PATIENT:  Watchung  29 y.o. male  PRE-OPERATIVE DIAGNOSIS:  SACRAL WOUND , RIGHT ISCHIAL WOUND   POST-OPERATIVE DIAGNOSIS:  SACRAL WOUND , RIGHT ISCHIAL WOUND   PROCEDURE:  Procedure(s): IRRIGATION AND DEBRIDEMENT SACRAL WOUND AND RIGHT ISCHIAL WOUND  (Right) APPLICATION OF A-CELL AND WOUND VAC  (N/A)  SURGEON:  Surgeon(s) and Role:    * Curstin Schmale Sanger, DO - Primary  PHYSICIAN ASSISTANT: Shawn Rayburn, PA  ASSISTANTS: none   ANESTHESIA:   general  EBL:     BLOOD ADMINISTERED:none  DRAINS: none   LOCAL MEDICATIONS USED:  NONE  SPECIMEN:  No Specimen  DISPOSITION OF SPECIMEN:  N/A  COUNTS:  YES  TOURNIQUET:  * No tourniquets in log *  DICTATION: .Dragon Dictation  PLAN OF CARE: Admit to inpatient   PATIENT DISPOSITION:  PACU - hemodynamically stable.   Delay start of Pharmacological VTE agent (>24hrs) due to surgical blood loss or risk of bleeding: no

## 2014-12-08 NOTE — Transfer of Care (Signed)
Immediate Anesthesia Transfer of Care Note  Patient: Jason [name]  Procedure(s) Performed: Procedure(s): IRRIGATION AND DEBRIDEMENT SACRAL WOUND AND RIGHT ISCHIAL WOUND  (Right) APPLICATION OF A-CELL AND WOUND VAC  (N/A)  Patient Location: PACU  Anesthesia Type:General  Level of Consciousness: patient cooperative and responds to stimulation  Airway & Oxygen Therapy: Patient Spontanous Breathing and Patient connected to nasal cannula oxygen  Post-op Assessment: Report given to RN and Post -op Vital signs reviewed and stable  Post vital signs: Reviewed and stable  Last Vitals:  Filed Vitals:   12/08/14 1300  BP: 114/59  Pulse: 77  Temp: 36.8 C  Resp: 20    Complications: No apparent anesthesia complications

## 2014-12-08 NOTE — Op Note (Signed)
Operative Note   DATE OF OPERATION: 12/08/2014  LOCATION: [name]  SURGICAL DIVISION: Plastic Surgery  PREOPERATIVE DIAGNOSES:  Sacral / ischial ulcer x 2 (5 x 5 x 1.5 cm sacral and 4 x 6 x 3 cm ischial)   POSTOPERATIVE DIAGNOSES:  same  PROCEDURE:  Preparation of sacral ischial ulcers with placement of Acell (1 gm and sheets 7 x 10 cm) and VAC. Debridement of skin and soft tissue 2 cm.  SURGEON: Theodoro Kos, DO  ASSISTANT: Shawn Rayburn, PA  ANESTHESIA:  General.   COMPLICATIONS: None.   INDICATIONS FOR PROCEDURE:  The patient, Jason [name] is a 29 y.o. male born on 03-Sep-1986, is here for treatment of sacral ischial ulcers. MRN: [id number]  CONSENT:  Informed consent was obtained directly from the patient. Risks, benefits and alternatives were fully discussed. Specific risks including but not limited to bleeding, infection, hematoma, seroma, scarring, pain, infection, contracture, asymmetry, wound healing problems, and need for further surgery were all discussed. The patient did have an ample opportunity to have questions answered to satisfaction.   DESCRIPTION OF PROCEDURE:  The patient was taken to the operating room. The patient's operative site was prepped and draped in a sterile fashion. A time out was performed and all information was confirmed to be correct.  General  anesthesia was administered.  The #10 blade and scissors were used to debride the area of the sacral and ischial ulcers (5 x 5 x 1.5 cm sacral and 4 x 6 x 3 cm ischial).  The area was irrigated with antibiotic solution.  The bovie was used for hemostasis.  The Acell powder 1 gm and sheet 7 x 10 cm were applied and secured with 5-0 Vicryl to the entire wound of equal size.  An adaptic, KY gel and VAC were placed and there was an excellent seal.    Xeroform and antibiotic ointment with ABDs were placed on the back wound. The patient tolerated the procedure well.  There were no complications.  The patient was allowed to wake from anesthesia, extubated and taken to the recovery room in satisfactory condition.

## 2014-12-08 NOTE — Interval H&P Note (Signed)
History and Physical Interval Note:  12/08/2014 3:17 PM  [employer]  has presented today for surgery, with the diagnosis of SACRAL WOUND , RIGHT ISCHIAL WOUND   The various methods of treatment have been discussed with the patient and family. After consideration of risks, benefits and other options for treatment, the patient has consented to  Procedure(s): IRRIGATION AND DEBRIDEMENT SACRAL WOUND AND RIGHT ISCHIAL WOUND  (Right) APPLICATION OF A-CELL AND WOUND VAC  (N/A) as a surgical intervention .  The patient's history has been reviewed, patient examined, no change in status, stable for surgery.  I have reviewed the patient's chart and labs.  Questions were answered to the patient's satisfaction.     SANGER,CLAIRE

## 2014-12-09 ENCOUNTER — Encounter (HOSPITAL_COMMUNITY): Payer: Self-pay | Admitting: Plastic Surgery

## 2014-12-09 LAB — GLUCOSE, CAPILLARY
GLUCOSE-CAPILLARY: 165 mg/dL — AB (ref 70–99)
GLUCOSE-CAPILLARY: 98 mg/dL (ref 70–99)
Glucose-Capillary: 178 mg/dL — ABNORMAL HIGH (ref 70–99)
Glucose-Capillary: 125 mg/dL — ABNORMAL HIGH (ref 70–99)

## 2014-12-09 MED ORDER — OXYCODONE-ACETAMINOPHEN 5-325 MG PO TABS
1.0000 | ORAL_TABLET | ORAL | Status: DC | PRN
Start: 2014-12-09 — End: 2014-12-14
  Administered 2014-12-09 – 2014-12-12 (×8): 2 via ORAL
  Administered 2014-12-13 (×2): 1 via ORAL
  Filled 2014-12-09: qty 2
  Filled 2014-12-09: qty 1
  Filled 2014-12-09 (×6): qty 2
  Filled 2014-12-09: qty 1
  Filled 2014-12-09 (×3): qty 2

## 2014-12-09 MED ORDER — METRONIDAZOLE IN NACL 5-0.79 MG/ML-% IV SOLN
500.0000 mg | Freq: Three times a day (TID) | INTRAVENOUS | Status: DC
  Administered 2014-12-09 – 2014-12-12 (×9): 500 mg via INTRAVENOUS
  Filled 2014-12-09 (×11): qty 100

## 2014-12-09 MED ORDER — ONDANSETRON HCL 4 MG/2ML IJ SOLN
INTRAMUSCULAR | Status: AC
Start: 2014-12-09 — End: 2014-12-09
  Filled 2014-12-09: qty 4

## 2014-12-09 MED ORDER — ZINC OXIDE 40 % EX OINT
TOPICAL_OINTMENT | Freq: Two times a day (BID) | CUTANEOUS | Status: DC
Start: 2014-12-09 — End: 2014-12-24
  Administered 2014-12-10: via TOPICAL
  Administered 2014-12-10: 1 via TOPICAL
  Administered 2014-12-10 – 2014-12-15 (×10): via TOPICAL
  Administered 2014-12-15: 1 via TOPICAL
  Administered 2014-12-16 – 2014-12-21 (×11): via TOPICAL
  Administered 2014-12-21: 1 via TOPICAL
  Administered 2014-12-22: 10:00:00 via TOPICAL
  Administered 2014-12-22 – 2014-12-23 (×2): 1 via TOPICAL
  Administered 2014-12-23 – 2014-12-24 (×2): via TOPICAL
  Filled 2014-12-09 (×2): qty 114

## 2014-12-09 MED ORDER — PREGABALIN 100 MG PO CAPS
100.0000 mg | ORAL_CAPSULE | Freq: Three times a day (TID) | ORAL | Status: DC
  Administered 2014-12-09 – 2014-12-24 (×43): 100 mg via ORAL
  Filled 2014-12-09 (×24): qty 1
  Filled 2014-12-09: qty 4
  Filled 2014-12-09 (×18): qty 1

## 2014-12-09 MED ORDER — ONDANSETRON HCL 4 MG/2ML IJ SOLN
4.0000 mg | Freq: Four times a day (QID) | INTRAMUSCULAR | Status: DC | PRN
  Administered 2014-12-09 – 2014-12-13 (×4): 4 mg via INTRAVENOUS
  Filled 2014-12-09 (×3): qty 2

## 2014-12-09 MED ORDER — BACLOFEN 10 MG PO TABS
10.0000 mg | ORAL_TABLET | Freq: Two times a day (BID) | ORAL | Status: DC
  Administered 2014-12-09 – 2014-12-24 (×27): 10 mg via ORAL
  Filled 2014-12-09 (×32): qty 1

## 2014-12-09 MED ORDER — DIPHENHYDRAMINE HCL 25 MG PO CAPS
25.0000 mg | ORAL_CAPSULE | Freq: Three times a day (TID) | ORAL | Status: DC | PRN
  Administered 2014-12-09: 25 mg via ORAL
  Filled 2014-12-09: qty 1

## 2014-12-09 MED ORDER — PREGABALIN 100 MG PO CAPS
200.0000 mg | ORAL_CAPSULE | Freq: Every day | ORAL | Status: DC
  Administered 2014-12-09 – 2014-12-23 (×13): 200 mg via ORAL
  Filled 2014-12-09 (×13): qty 2

## 2014-12-09 MED ORDER — BACLOFEN 20 MG PO TABS
20.0000 mg | ORAL_TABLET | Freq: Every day | ORAL | Status: DC
  Administered 2014-12-09 – 2014-12-23 (×13): 20 mg via ORAL
  Filled 2014-12-09 (×5): qty 1
  Filled 2014-12-09: qty 2
  Filled 2014-12-09 (×3): qty 1
  Filled 2014-12-09: qty 2
  Filled 2014-12-09 (×6): qty 1

## 2014-12-09 MED ORDER — SILVER NITRATE-POT NITRATE 75-25 % EX MISC
10.0000 | CUTANEOUS | Status: DC | PRN
  Filled 2014-12-09: qty 10

## 2014-12-09 MED ORDER — DIPHENHYDRAMINE HCL 50 MG/ML IJ SOLN: 12.5000 mg | Freq: Three times a day (TID) | INTRAMUSCULAR | Status: DC | PRN

## 2014-12-09 NOTE — Progress Notes (Signed)
[facility] PROGRESS NOTE  Jason [name] UUV:[id number] DOB: Feb 22, 1986 DOA: 12/01/2014 PCP: [name], MD  Assessment/Plan: 1. Altered mental status- much improved 3/3, mom reported patient back to his baseline , patient has complicated history with pneumonia ischial osteomyelitis, Klebsiella bacteremia and Burkhoderia UTI.  Dr. Michel Bickers recommends to stop meropenem and start Flagyl as well as Levaquin along with vancomycin.  2. Hallucinations - non today on 3/3, improved after patient was started on Seroquel 25 mg 3 times a day per psych. Patient's mom requested to stop prn haldol and ativan. Patient's mom reported good response from seroquel and remeron 3. 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 It showed multiple food collections within the abductor magnus and abductor brevis muscles likely consistent with intramuscular abscesses. Patient was seen by plastic surgery underwent debridement on 11/12/2014 11/18/2014 and 11/25/2014 with VAC placement. Patient started on vancomycin and ertapenem as per infectious disease now the ertapenem was changed to meropenem. Blood culture urine culture has been collected. We'll follow the culture results, continue vancomycin, Flagyl, Levaquin. Will discontinue meropenem. Will start fentanyl patch 25 g every 72 hours. decubitus ulcer debrided again by plastic surgery on 3/2. Restart baclofen and lyrica on 3/3 for pain control. 4. Klebsiella pneumonia bacteremia- blood culture  was positive for Klebsiella pneumonia, currently patient on levaquin/flagyl/ vancomycin 5. Burkholderia UTI- patient has been treated for UTI. No need to treat with more antibiotics as per ID. 6. Type 1 diabetes mellitus- will change the Lantus to 25 units subcutaneous daily. Continue sliding scale insulin 7. Functional quadriplegia, neurogenic bladder- bedbound, secondary to spinal  cord infarction. 8. DVT prophylaxis- heparin  Code Status: Full code Family Communication: *Spoke to patient's mother at bedside Disposition Plan: Home with home health when stable   Consultants:  Psychiatry  Neurology  Plastic surgery  Phone coversation with ID  Procedures:   debridement of decubitus ulcer 3/2  Antibiotics:  Vancomycin/flagyl/levaquin  Meropenem stopped  HPI/Subjective: 29 y.o. 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 Patient was recently hospitalized from 2/1 to 2/12. He was treated for ischial osteomyelitis, pneumonia, Klebsiella bacteremia, and Burkhoderia UTI in hospital. Patient was discharged on IV vancomycin plus ertapenem, which are supposed to be continued for total of 6 weeks per ID. Patient has been doing okay until 3 days ago when he started hallucinating. It seems that he is on the phone talking to somebody who is not there. Mother reports pt has not been sleeping at all in the past 3 days. Mother states that patient had hallucination 3 months ago, which was likely caused by morphine, since his symptoms resolved after discontinuation of morphine. This time, mother discontinued patient's Percocet, Lyrica and baclofen without any help. His mother states that patient developed new rashes over his forehead and upper face. He has back pain. No headache, neck rigidity or photophobia. Patient denies fever, chills, cough, chest pain, SOB, nausea, vomiting, abdominal pain, diarrhea, dysuria, urgency, frequency, hematuria or leg swelling.  In ED, patient was found to have negative CT head for acute abnormalities. Leukocytosis with WBC 13.0, negative UDS, lactate 1.06, positive urinalysis for moderate leukocytes, negative salicylate and acetaminophen level,  ammonia 17, temperature normal. Electrolytes okay. Patient's admitted to inpatient for further evaluation and treatment.  Patient seen and examined, much  better on 3/3,  mom at bedside, mom states like to go home tomorrow if continue do well. Objective: Filed Vitals:   12/09/14 0423  BP: 124/76  Pulse: 94  Temp: 99.1 F (37.3 C)  Resp: 15    Intake/Output Summary (Last 24 hours) at 12/09/14 1139 Last data filed at 12/09/14 0900  Gross per 24 hour  Intake    440 ml  Output    300 ml  Net    140 ml   Filed Weights   12/05/14 0500 12/08/14 0635 12/09/14 0500  Weight: 56.7 kg (125 lb) 56.7 kg (125 lb) 58.968 kg (130 lb)    Exam:   General:  *Appear in no acute distress, very pleasant , fully alert and appropriate  Cardiovascular: S1-S2 regular  Respiratory: Clear bilaterally  Abdomen: Soft nontender no organomegaly, +colostomy . +Wound vac  Musculoskeletal: No edema  , quadriplegia  Data Reviewed: Basic Metabolic Panel:  Recent Labs Lab 12/03/14 0558 12/06/14 0535 12/08/14 0514  NA 137 137 137  K 3.8 4.3 3.8  CL 110 112 112  CO2 22 21 22   GLUCOSE 149* 246* 86  BUN 6 12 8   CREATININE 0.62 0.83 0.62  CALCIUM 9.1 9.4 9.2   Liver Function Tests: No results for input(s): AST, ALT, ALKPHOS, BILITOT, PROT, ALBUMIN in the last 168 hours. No results for input(s): LIPASE, AMYLASE in the last 168 hours. No results for input(s): AMMONIA in the last 168 hours. CBC:  Recent Labs Lab 12/03/14 0558 12/08/14 0514  WBC 14.2* 13.5*  HGB 8.0* 7.3*  HCT 25.2* 22.8*  MCV 81.6 84.1  PLT 540* 416*   Cardiac Enzymes: No results for input(s): CKTOTAL, CKMB, CKMBINDEX, TROPONINI in the last 168 hours. BNP (last 3 results) No results for input(s): BNP in the last 8760 hours.  ProBNP (last 3 results)  Recent Labs  01/29/14 2113  PROBNP 1343.0*    CBG:  Recent Labs Lab 12/08/14 1721 12/08/14 1745 12/08/14 2112 12/08/14 2236 12/09/14 0633  GLUCAP 50* 150* 78  108* 178*    Recent Results (from the past 240 hour(s))  Blood culture (routine x 2)     Status: None   Collection Time: 12/01/14  4:05 PM  Result Value Ref Range Status   Specimen Description BLOOD  LAC  Final   Special Requests BOTTLES DRAWN AEROBIC AND ANAEROBIC 5ML EACH  Final   Culture   Final    NO GROWTH 5 DAYS Performed at [facility]    Report Status 12/07/2014 FINAL  Final  Urine culture     Status: None   Collection Time: 12/02/14  2:25 AM  Result Value Ref Range Status   Specimen Description URINE, CATHETERIZED  Final   Special Requests NONE  Final   Colony Count NO GROWTH Performed at [facility]   Final   Culture NO GROWTH Performed at [facility]   Final   Report Status 12/03/2014 FINAL  Final  Surgical pcr screen     Status: None   Collection Time: 12/08/14  8:39 AM  Result Value Ref Range Status   MRSA, PCR NEGATIVE NEGATIVE Final   Staphylococcus aureus NEGATIVE NEGATIVE Final    Comment:        The Xpert SA Assay (FDA approved for NASAL specimens in patients over 53 years of age), is one component of a comprehensive surveillance program.  Test performance has been validated by [facility] for patients greater than or equal to 1 year  old. It is not intended to diagnose infection nor to guide or monitor treatment.      Studies: No results found.  Scheduled Meds: . antiseptic oral rinse  7 mL Mouth Rinse q12n4p  . atorvastatin  20 mg Oral Daily  . baclofen  10 mg Oral BID WC  . baclofen  20 mg Oral QHS  . chlorhexidine  15 mL Mouth Rinse BID  . feeding supplement (GLUCERNA SHAKE)  237 mL Oral TID BM  . feeding supplement (PRO-STAT SUGAR FREE 64)  30 mL Oral BID  . fentaNYL  25 mcg Transdermal Q72H  . heparin  5,000 Units Subcutaneous 3 times per day  . insulin aspart  0-9 Units Subcutaneous TID WC  . insulin glargine  25 Units Subcutaneous QHS  . levofloxacin (LEVAQUIN) IV  500 mg Intravenous Q24H  . lidocaine   1 application Topical Once per day on Mon Wed Fri  . metronidazole  500 mg Intravenous Q8H  . mirtazapine  15 mg Oral QHS  . mupirocin ointment  1 application Nasal Once per day on Mon Wed Fri  . pantoprazole  40 mg Oral Daily  . pregabalin  100 mg Oral TID AC  . pregabalin  200 mg Oral QHS  . QUEtiapine  25 mg Oral TID  . sodium chloride  3 mL Intravenous Q12H  . vancomycin  1,500 mg Intravenous Q24H   Continuous Infusions: . lactated ringers 10 mL/hr at 12/08/14 1532    Principal Problem:   Acute encephalopathy Active Problems:   Type 1 diabetes, uncontrolled, with neuropathy   Asthma   GERD   Gastroparesis due to DM   Protein-calorie malnutrition, severe   Spinal cord infarction (history of)   Tetraplegia   Neurogenic bladder   UTI (urinary tract infection)   Sacral pressure sore   Stage IV decubitus ulcer   Osteomyelitis, pelvis   Anemia   Burkholderia cepacia infection   Disorientation      Jason [name]  [facility] Pager [phone]. If 7PM-7AM, please contact night-coverage at [email or web address], password [facility] 12/09/2014, 11:39 AM  LOS: 8 days

## 2014-12-09 NOTE — Progress Notes (Addendum)
NUTRITION FOLLOW UP  Pt meets criteria for SEVERE MALNUTRITION in the context of chronic illness as evidenced by a 9.6% weight loss in 2 months and severe muscle mass loss.  DOCUMENTATION CODES Per approved criteria  -Severe Malnutrition in the context of chronic illness   INTERVENTION: Continue Glucerna Shake po TID, each supplement provides 220 kcal and 10 grams of protein.  Continue 30 ml Prostat po BID, each supplement provides 100 kcal and 15 grams of protein.  Provide nourishment snack (sandwich). Ordered.  Encourage adequate PO intake.  NUTRITION DIAGNOSIS: Increased nutrient needs related to wound healing as evidenced by estimated nutrition needs; ongoing  Goal: Pt to meet >/= 90% of their estimated nutrition needs; met  Monitor:  PO intake, weight trends, labs, I/O's  29 y.o. 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  Meal completion has been 100%. Pt reports his appetite is good with no other difficulties. Pt is oriented and alert. Pt has been drinking his Glucerna Shake and consuming his Prostat. Will continue with current orders. Pt additionally requested nourishment snacks (turkey/roast beef sandwiches). RD to order. Pt was encouraged to eat his food at meals and to consume his supplements.   Labs and medication reviewed.  Height: Ht Readings from Last 1 Encounters:  12/02/14 5' 8"  (1.727 m)    Weight: Wt Readings from Last 1 Encounters:  12/09/14 130 lb (58.968 kg)    BMI:  Body mass index is 19.77 kg/(m^2).   Re-Estimated Nutritional Needs: Kcal: 2100-2300 Protein: 105-115 grams Fluid: 2.1 - 2.3 L/day  Skin: Stage II pressure ulcer on finger, left buttocks, Stage III on L heel, incision on  coccyx, back, sacrum, +1 LE edema  Diet Order: Diet Carb Modified   Intake/Output Summary (Last 24 hours) at 12/09/14 1432 Last data filed at 12/09/14 1300  Gross per 24 hour  Intake    800 ml  Output    300 ml  Net    500 ml    Last BM: 3/2-ostomy  Labs:   Recent Labs Lab 12/03/14 0558 12/06/14 0535 12/08/14 0514  NA 137 137 137  K 3.8 4.3 3.8  CL 110 112 112  CO2 22 21 22   BUN 6 12 8   CREATININE 0.62 0.83 0.62  CALCIUM 9.1 9.4 9.2  GLUCOSE 149* 246* 86    CBG (last 3)   Recent Labs  12/08/14 2236 12/09/14 0633 12/09/14 1152  GLUCAP 108* 178* 125*    Scheduled Meds: . antiseptic oral rinse  7 mL Mouth Rinse q12n4p  . atorvastatin  20 mg Oral Daily  . baclofen  10 mg Oral BID WC  . baclofen  20 mg Oral QHS  . chlorhexidine  15 mL Mouth Rinse BID  . feeding supplement (GLUCERNA SHAKE)  237 mL Oral TID BM  . feeding supplement (PRO-STAT SUGAR FREE 64)  30 mL Oral BID  . fentaNYL  25 mcg Transdermal Q72H  . heparin  5,000 Units Subcutaneous 3 times per day  . insulin aspart  0-9 Units Subcutaneous TID WC  . insulin glargine  25 Units Subcutaneous QHS  . levofloxacin (LEVAQUIN) IV  500 mg Intravenous Q24H  . lidocaine  1 application Topical Once per day on Mon Wed Fri  . metronidazole  500 mg Intravenous Q8H  .  mirtazapine  15 mg Oral QHS  . mupirocin ointment  1 application Nasal Once per day on Mon Wed Fri  . pantoprazole  40 mg Oral Daily  . pregabalin  100 mg Oral TID AC  . pregabalin  200 mg Oral QHS  . QUEtiapine  25 mg Oral TID  . sodium chloride  3 mL Intravenous Q12H  . vancomycin  1,500 mg Intravenous Q24H    Continuous Infusions: . lactated ringers 10 mL/hr at 12/08/14 1532    Past Medical History  Diagnosis Date  . GERD (gastroesophageal reflux disease)   . Asthma   . Hx MRSA infection     on face  . Gastroparesis   . Diabetic neuropathy   . Seizures   . Family history of anesthesia complication     Pt mother can't have  epidural procedures  . Dysrhythmia   . Pneumonia   . Arthritis   . Fibromyalgia   . 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 Type I diabetes mellitus     sees Dr. Loanne Drilling     Past Surgical History  Procedure Laterality Date  . Tonsillectomy    . Multiple extractions with alveoloplasty N/A 08/03/2014    Procedure: MULTIPLE EXTRACTIONS;  Surgeon: Gae Bon, DDS;  Location: [place];  Service: Oral Surgery;  Laterality: N/A;  . Tee without cardioversion N/A 08/17/2014    Procedure: TRANSESOPHAGEAL ECHOCARDIOGRAM (TEE);  Surgeon: Dorothy Spark, MD;  Location: [facility] ENDOSCOPY;  Service: Cardiovascular;  Laterality: N/A;  . Debridment of decubitus ulcer N/A 10/04/2014    Procedure: DEBRIDMENT OF DECUBITUS ULCER;  Surgeon: Georganna Skeans, MD;  Location: [place];  Service: General;  Laterality: N/A;  . Laparoscopic diverted colostomy N/A 10/12/2014    Procedure: LAPAROSCOPIC DIVERTING COLOSTOMY;  Surgeon: Donnie Mesa, MD;  Location: [place];  Service: General;  Laterality: N/A;  . Insertion of suprapubic catheter N/A 10/12/2014    Procedure: INSERTION OF SUPRAPUBIC CATHETER;  Surgeon: Reece Packer, MD;  Location: [place];  Service: Urology;  Laterality: N/A;  . Incision and drainage of wound N/A 11/12/2014    Procedure: IRRIGATION AND DEBRIDEMENT OF WOUNDS WITH BONE BIOPSY AND SURGICAL PREP ;  Surgeon: Theodoro Kos, DO;  Location: [place];  Service: Plastics;  Laterality: N/A;  . Application of a-cell of back N/A 11/12/2014    Procedure: APPLICATION A CELL AND VAC ;  Surgeon: Theodoro Kos, DO;  Location: [place];  Service: Plastics;  Laterality: N/A;  . Incision and drainage of wound N/A 11/18/2014    Procedure: IRRIGATION AND DEBRIDEMENT OF SACRAL ULCER ONLY WITH PLACEMENT OF A CELL AND VAC/ DRESSING CHANGE TO UPPER BACK AREA.;  Surgeon: Theodoro Kos, DO;  Location: [place];  Service: Plastics;  Laterality: N/A;  . Incision and drainage of wound N/A 11/25/2014    Procedure:  IRRIGATION AND DEBRIDEMENT OF SACRAL ULCER AND BACK BURN WITH PLACEMENT OF A-CELL;  Surgeon: Theodoro Kos, DO;  Location: [name];  Service: Plastics;  Laterality: N/A;  . Minor application of wound vac N/A 11/25/2014    Procedure:  WOUND VAC CHANGE;  Surgeon: Theodoro Kos, DO;  Location: [place];  Service: Plastics;  Laterality: N/A;    Kallie Locks, MS, RD, LDN Pager # [phone] After hours/ weekend pager # [phone]

## 2014-12-09 NOTE — Consult Note (Addendum)
WOC wound follow up Wound type: pressure ulcers left foot and left finger follow up Measurement: Left foot/left heel unchanged Left middle finger 0.5cm x 0.5cm x0.1cm  Wound bed: all are clean, pink, and moist. The foot wounds are a bit hypergranulated today so I will stop the hydrogel and switch to silicone foam for this reason.  Drainage (amount, consistency, odor) minimal at all sites (finger, left foot) Periwound: intact  Dressing procedure/placement/frequency Silicone foam to the left foot wounds, change every 3 days.  I have ordered new Prevalon boots as I have asked the mother to bring in ones from home and she has yet to do so and I am concerned for further breakdown and/or new breakdown for this reason. Hydrogel for the left finger wound for moist wound healing, change daily.  WOC ostomy follow up Stoma type/location: LLQ, loop diverting ostomy Stomal assessment/size: 1 5/8" round, budded Peristomal assessment: two areas of full thickness skin erosion from the support rod (red robinson) cath that was sutured in place and has not been removed since his surgery.  He has not had any follow up with surgery team since his surgery, therefore the rod was sill in place. Verified with [facility] this week to remove. Removed support rod without difficulty. Treatment options for stomal/peristomal skin: liquid skin prep used due to the high tendency of this patient to have medical adhesive related injury and blistering at times.  Output liquid brown effluent  Ostomy pouching: 2pc. 2 1/4" used.  Education provided: had mom to visualize the site now that rod out and explained peristomal appearance, she will monitor these sites for healing.  They should heal up now that rod has been removed.   Enrolled patient in [place] Start Discharge program: Yes, mother reports that she never received SS information at the time of his surgery.  I will enroll now.   Please note plastic surgery is managing the  sacral wound, ischial wound, and back wound.  For any assistance or questions for the care of these wounds please contact Dr. Migdalia Dk.   WOC noted pts difficulty with eating today and asked for OT to evaluate for any splints or assistive devices that may help [place] with eating.   [place] team will follow along with you for weekly wound  And ostomy assessments.  Please notify me of any acute changes in the wounds or any new areas of concerns Para March RN,CWOCN [phone]

## 2014-12-10 ENCOUNTER — Inpatient Hospital Stay (HOSPITAL_COMMUNITY): Payer: Medicaid Other

## 2014-12-10 DIAGNOSIS — L89300 Pressure ulcer of unspecified buttock, unstageable: Secondary | ICD-10-CM

## 2014-12-10 DIAGNOSIS — L8990 Pressure ulcer of unspecified site, unspecified stage: Secondary | ICD-10-CM

## 2014-12-10 DIAGNOSIS — R509 Fever, unspecified: Secondary | ICD-10-CM

## 2014-12-10 DIAGNOSIS — Z9359 Other cystostomy status: Secondary | ICD-10-CM

## 2014-12-10 DIAGNOSIS — E1140 Type 2 diabetes mellitus with diabetic neuropathy, unspecified: Secondary | ICD-10-CM

## 2014-12-10 DIAGNOSIS — E1165 Type 2 diabetes mellitus with hyperglycemia: Secondary | ICD-10-CM

## 2014-12-10 DIAGNOSIS — Z452 Encounter for adjustment and management of vascular access device: Secondary | ICD-10-CM | POA: Diagnosis present

## 2014-12-10 LAB — URINALYSIS, ROUTINE W REFLEX MICROSCOPIC
Bilirubin Urine: NEGATIVE
GLUCOSE, UA: NEGATIVE mg/dL
KETONES UR: NEGATIVE mg/dL
Nitrite: NEGATIVE
PROTEIN: NEGATIVE mg/dL
Specific Gravity, Urine: 1.015 (ref 1.005–1.030)
Urobilinogen, UA: 0.2 mg/dL (ref 0.0–1.0)
pH: 6 (ref 5.0–8.0)

## 2014-12-10 LAB — CBC
HCT: 22.7 % — ABNORMAL LOW (ref 39.0–52.0)
Hemoglobin: 7.3 g/dL — ABNORMAL LOW (ref 13.0–17.0)
MCH: 26.8 pg (ref 26.0–34.0)
MCHC: 32.2 g/dL (ref 30.0–36.0)
MCV: 83.5 fL (ref 78.0–100.0)
PLATELETS: 482 10*3/uL — AB (ref 150–400)
RBC: 2.72 MIL/uL — ABNORMAL LOW (ref 4.22–5.81)
RDW: 17.2 % — AB (ref 11.5–15.5)
WBC: 14.5 10*3/uL — AB (ref 4.0–10.5)

## 2014-12-10 LAB — BASIC METABOLIC PANEL
Anion gap: 4 — ABNORMAL LOW (ref 5–15)
BUN: 7 mg/dL (ref 6–23)
CALCIUM: 9.1 mg/dL (ref 8.4–10.5)
CO2: 22 mmol/L (ref 19–32)
Chloride: 112 mmol/L (ref 96–112)
Creatinine, Ser: 0.68 mg/dL (ref 0.50–1.35)
Glucose, Bld: 127 mg/dL — ABNORMAL HIGH (ref 70–99)
Potassium: 3.4 mmol/L — ABNORMAL LOW (ref 3.5–5.1)
SODIUM: 138 mmol/L (ref 135–145)

## 2014-12-10 LAB — URINE MICROSCOPIC-ADD ON

## 2014-12-10 LAB — GLUCOSE, CAPILLARY
Glucose-Capillary: 127 mg/dL — ABNORMAL HIGH (ref 70–99)
Glucose-Capillary: 174 mg/dL — ABNORMAL HIGH (ref 70–99)
Glucose-Capillary: 205 mg/dL — ABNORMAL HIGH (ref 70–99)
Glucose-Capillary: 214 mg/dL — ABNORMAL HIGH (ref 70–99)
Glucose-Capillary: 227 mg/dL — ABNORMAL HIGH (ref 70–99)

## 2014-12-10 MED ORDER — INSULIN GLARGINE 100 UNIT/ML ~~LOC~~ SOLN
13.0000 [IU] | Freq: Once | SUBCUTANEOUS | Status: AC
  Administered 2014-12-11: 13 [IU] via SUBCUTANEOUS
  Filled 2014-12-10: qty 0.13

## 2014-12-10 MED ORDER — OXYCODONE-ACETAMINOPHEN 5-325 MG PO TABS: 1.0000 | ORAL_TABLET | ORAL | Status: DC | PRN

## 2014-12-10 MED ORDER — LEVOFLOXACIN IN D5W 500 MG/100ML IV SOLN: INTRAVENOUS | Status: DC

## 2014-12-10 MED ORDER — METRONIDAZOLE IN NACL 5-0.79 MG/ML-% IV SOLN: 500.0000 mg | Freq: Three times a day (TID) | INTRAVENOUS | Status: DC

## 2014-12-10 MED ORDER — SODIUM CHLORIDE 0.9 % IV BOLUS (SEPSIS)
250.0000 mL | Freq: Once | INTRAVENOUS | Status: AC
  Administered 2014-12-10: 250 mL via INTRAVENOUS

## 2014-12-10 MED ORDER — PREGABALIN 200 MG PO CAPS: 200.0000 mg | ORAL_CAPSULE | Freq: Every day | ORAL | Status: DC

## 2014-12-10 MED ORDER — ZINC OXIDE 40 % EX OINT: TOPICAL_OINTMENT | Freq: Two times a day (BID) | CUTANEOUS | Status: DC

## 2014-12-10 MED ORDER — FESOTERODINE FUMARATE ER 4 MG PO TB24
4.0000 mg | ORAL_TABLET | Freq: Every day | ORAL | Status: DC | PRN
  Administered 2014-12-10: 4 mg via ORAL
  Filled 2014-12-10 (×2): qty 1

## 2014-12-10 MED ORDER — GLUCERNA SHAKE PO LIQD: 237.0000 mL | Freq: Three times a day (TID) | ORAL | Status: DC

## 2014-12-10 MED ORDER — VANCOMYCIN HCL 10 G IV SOLR: 1500.0000 mg | INTRAVENOUS | Status: DC

## 2014-12-10 MED ORDER — QUETIAPINE FUMARATE 25 MG PO TABS: 25.0000 mg | ORAL_TABLET | Freq: Three times a day (TID) | ORAL | Status: DC

## 2014-12-10 MED ORDER — INSULIN GLARGINE 100 UNIT/ML ~~LOC~~ SOLN
25.0000 [IU] | Freq: Every day | SUBCUTANEOUS | Status: DC
  Filled 2014-12-10: qty 0.25

## 2014-12-10 MED ORDER — LEVOFLOXACIN IN D5W 500 MG/100ML IV SOLN: 500.0000 mg | INTRAVENOUS | Status: DC

## 2014-12-10 MED ORDER — METRONIDAZOLE IN NACL 5-0.79 MG/ML-% IV SOLN: INTRAVENOUS | Status: DC

## 2014-12-10 NOTE — Progress Notes (Signed)
Called per RN to see pt with worsening AMS, Fever. Pt received 289ml NS fluid bolus earlier this evening. Oral temp 99.5, ST 110s, BP now 109/63. Po2 100% on 3 LNC. Pt lungs mildly congested in upper lobes. Pt lethargic but follows simple commands. . RN advised to monitor pt closely, may need additional fluid bolus if BP drops again. Pt currently on ABX IV and Infectious Disease MD consulting. Wil follow up on Pt later tonight.

## 2014-12-10 NOTE — Evaluation (Signed)
Occupational Therapy Evaluation Patient Details Name: Jason [name] MRN: [id number] DOB: Nov 01, 1985 Today's Date: 12/10/2014    History of Present Illness is a 29 y.o. 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 Pt underwent IRRIGATION AND DEBRIDEMENT SACRAL WOUND AND RIGHT ISCHIAL WOUND (Right) on 3/216 with placement of wound vac.   Clinical Impression   OT order for self feeding. Issued blue mug with handle as well as dorsal wrist support with universal cuff. Pt able to drink better from this cup using the handle. Assisted pt to apply wrist support and placement of utensil. Did not see pt at meal time to try the support. Encouraged pt and family to try wrist support with next meal. Also educated nursing on AE provided. Will follow for OT to see if AE is helping with self feeding. Encouraged pt and mother that if wrist support is rubbing his skin on L Ue, to place a soft barrier between brace and skin. Will assess for any skin irritation while pt in the hospital next visit as able and if anything needs to placed on forearm.     Follow Up Recommendations  No OT follow up;Supervision/Assistance - 24 hour    Equipment Recommendations  None recommended by OT    Recommendations for Other Services       Precautions / Restrictions Precautions Precautions: Fall Precaution Comments: wound vac      Mobility Bed Mobility                  Transfers                      Balance                                            ADL Overall ADL's : Needs assistance/impaired Eating/Feeding: Set up;Bed level;Moderate assistance                                     General ADL Comments: Discussed at length new issues with feeding mostly with  mother as pt very lethargic during session. She states pt usually able to grip own utensl without any AE needed and feed self. She has noticed feeding as become more difficult during this time of the hallucinations being present. She states pt usually clasps drink cup with both hands and lifts to mouth  and usually uses tumbler/mug that has a grip band/rubber material around the middle to help grasp. He has a universal cuff but she states he gets frustrated with it and has difficulty manuevering it well enough to scoop heavier foods. Pt able to bring wrists into neutral but appears he doesnt have enough strength to maintain for scooping food. Introduced idea of the dorsal wrist support with universal cuff and mother is interested in trying. Also explained a plastic mug with handle and lid for straw. Brought supplies to room and pt did well with positoning own mug in his hands and bringing to mouth to sip out of straw with bilateral hands/Ue. The first attempt he needed min assist for placement of R hand around handle and then the second attempt  he was able to do on his own.  Would have tried handle on Left hand but pt with a thicker bandage. Placed dorsal wrist support on L hand with utensil and pt feels this gives him more support and was able to simulate the motion of scooping food with better control than regular cuff per clinical judgement. Would like to try with meal observation. Informed nursing to encourage use of AE with meals as well as the mom. Pt pleased with AE so far.     Vision     Perception     Praxis      Pertinent Vitals/Pain Pain Assessment: Faces Faces Pain Scale: Hurts even more Pain Location: buttock Pain Intervention(s): Patient requesting pain meds-RN notified     Hand Dominance Left   Extremity/Trunk Assessment Upper Extremity Assessment RUE Deficits / Details: mother reports no use of adaptive utensils to feed self at home. has had more weakness in the last week or so  with trying to feed self. Difficult to assess as pt lethargic during much of session but would wake up for brief periods to assess. Elbow 2/5. Minimally lifted  Ues off pillow. tends to use both hands to grasp around cup to hold.  RUE Coordination: decreased fine motor;decreased gross motor LUE Deficits / Details: note bandage on 3rd digit. Noted hardened lump area on dorsal side of wrist. informed nursing.  LUE Coordination: decreased fine motor;decreased gross motor           Communication Communication Communication: No difficulties   Cognition Arousal/Alertness: Lethargic (on and off awake/asleep during eval) Behavior During Therapy: WFL for tasks assessed/performed Overall Cognitive Status: Within Functional Limits for tasks assessed                     General Comments       Exercises       Shoulder Instructions      Home Living Family/patient expects to be discharged to:: Private residence   Available Help at Discharge: Family;Available 24 hours/day                                    Prior Functioning/Environment Level of Independence: Needs assistance    ADL's / Homemaking Assistance Needed: pt's mom states pt can usually feed himself but has gotten weaker recently and she has had to help feed him. Since the hallucinations have happened.        OT Diagnosis: Generalized weakness   OT Problem List: Decreased strength;Decreased knowledge of use of DME or AE   OT Treatment/Interventions: Self-care/ADL training;Patient/family education;DME and/or AE instruction    OT Goals(Current goals can be found in the care plan section) Acute Rehab OT Goals Patient Stated Goal: mother stated for him to feed himself better. OT Goal Formulation: With family Time For Goal Achievement: 12/17/14 Potential to Achieve Goals: Good  OT Frequency: Min 2X/week   Barriers to D/C:            Co-evaluation              End of Session Equipment  Utilized During Treatment: Other (comment) (adaptive feeding utensils)  Activity Tolerance: Patient limited by lethargy Patient left: in bed;with call bell/phone within reach;with family/visitor present   Time: 1120-1151 OT Time Calculation (min): 31 min Charges:  OT General Charges $OT Visit: 1 Procedure OT Evaluation $Initial OT Evaluation Tier I: 1 Procedure OT Treatments $Self  Care/Home Management : 8-22 mins G-Codes:    Jules Schick  [phone] 12/10/2014, 1:05 PM

## 2014-12-10 NOTE — Plan of Care (Signed)
Problem: Phase I Progression Outcomes Goal: Voiding-avoid urinary catheter unless indicated Outcome: Not Met (add Reason) Chronic catheter use d/t quadraplegia.

## 2014-12-10 NOTE — Progress Notes (Signed)
Ambulance transport arranged for 5:00 pm via [facility].

## 2014-12-10 NOTE — Progress Notes (Signed)
[facility] cancelled for pt this pm.  Pt with elevated temperature per RN. CSW will be available to re-schedule ambulance this weekend as appropriate.

## 2014-12-10 NOTE — Progress Notes (Signed)
ANTIBIOTIC CONSULT NOTE - FOLLOW UP  Pharmacy Consult for Vancomycin Indication: Osteo  Allergies  Allergen Reactions  . Cefuroxime Axetil Anaphylaxis  . Ertapenem Other (See Comments)    Rash and confusion  . Morphine And Related Other (See Comments)    Changed mental status, confusion, headache, visual hallucination  . Penicillins Anaphylaxis and Other (See Comments)    ?can take amoxicillin?  . Sulfa Antibiotics Anaphylaxis, Shortness Of Breath and Other (See Comments)  . Tessalon [Benzonatate] Anaphylaxis  . Shellfish Allergy Itching and Other (See Comments)    Took benadryl to alleviate reaction    Patient Measurements: Height: 5' 8" (172.7 cm) (from 11/08/14) Weight: 130 lb (58.968 kg) IBW/kg (Calculated) : 68.4 Adjusted Body Weight:   Vital Signs: Temp: 98.2 F (36.8 C) (03/04 0603) BP: 101/64 mmHg (03/04 0603) Pulse Rate: 85 (03/04 0603) Intake/Output from previous day: 03/03 0701 - 03/04 0700 In: 75 [P.O.:960] Out: 1900 [Urine:1300; Drains:100; Stool:500] Intake/Output from this shift: Total I/O In: 764.7 [P.O.:360; I.V.:404.7] Out: -   Labs:  Recent Labs  12/08/14 0514 12/10/14 0435  WBC 13.5* 14.5*  HGB 7.3* 7.3*  PLT 416* 482*  CREATININE 0.62 0.68   Estimated Creatinine Clearance: 112.7 mL/min (by C-G formula based on Cr of 0.68). No results for input(s): VANCOTROUGH, VANCOPEAK, VANCORANDOM, GENTTROUGH, GENTPEAK, GENTRANDOM, TOBRATROUGH, TOBRAPEAK, TOBRARND, AMIKACINPEAK, AMIKACINTROU, AMIKACIN in the last 72 hours.   Assessment: hallucinations, AMS 28 YOM on abx for osteomyelitis.His renal function has been stable, but would be cautious with interpreting CrCL given quadriplegic status.   ID: Vanc/Flagyl/LVQ for osteo, vanc levels and doses have been all over the place during past admissions. Afebrile, WBC 13.5>14.5.S/p I&D 2/18, 3/2. Neuro suspected Merrem causing mental status change so d/c.  Vanc PTA >> (initally planning 6 weeks from  2/5) LVQ 2/27 >> Flagyl 2/27 >> Merrem 2/25 >> 2/27  2/25 VT = 11.3 on 1gm q24 - inc to 12106m q24 3/1 VT = 11.8 on 12599mq24h (but some doses weren't given at correct time) - inc to 150045m24  2/25 UCx - neg FINAL 2/24 BCx x2 - neg FINAL  CV: no hx - VSS - Lipitor, PRN hydralazine  Endo: TSH elevated at 7.887 (down from 8.342) -?free T3/T4. DM - CBGs 98-178 on SSI + Lantus 25/d. No further hypoglycemia  GI/Nutr: GERD, gastroparesis - 2/25 LFTs WNL except alk phos - carb mod diet, PPI PO  Neuro: neuropathy, seizures, fibromyaglia, CVA, quadriplegia - confusion improved -pt A&O (neuro s/o). EEG normal. Meds: Baclofen, Fent patch, Remeron, Lyrica, Seroquel  Renal: quadriplegia - 3/2 SCr 0.68, CrCL 104 ml/min (likely not reflective in quadriplegic), K=3.4  Pulm: asthma - RA  Heme/Onc: 3/2 hgb down to 7.3, plts elevated  Other: midodrine, po PPI, MC  Best practices: heparin SQ  Goal of Therapy:  Vancomycin trough level 15-20 mcg/ml  Plan:  Levaquin 500m38m Q24H and Flagyl 500mg49mQ8H Vancomycin 1500mg 13m24h. Cancelled trough ordered 2hrs ago, RN hung dose.   Gabrial Poppell S. RobertAlford HighlandmD, BCPS Clinical Staff Pharmacist Pager [phone] Ghaziinger 12/10/2014,1:14 PM

## 2014-12-10 NOTE — Consult Note (Addendum)
[facility]  Total days of antibiotics 10        Day 7 vanco        Day 7 cipro        Day 7 metronidazole       Reason for Consult: fever     Referring Physician: xu  Principal Problem:   Acute encephalopathy Active Problems:   Type 1 diabetes, uncontrolled, with neuropathy   Asthma   GERD   Gastroparesis due to DM   Protein-calorie malnutrition, severe   Spinal cord infarction (history of)   Tetraplegia   Neurogenic bladder   UTI (urinary tract infection)   Sacral pressure sore   Stage IV decubitus ulcer   Osteomyelitis, pelvis   Anemia   Burkholderia cepacia infection   Disorientation   PICC (peripherally inserted central catheter) in place    HPI: Jason [name] is a 29 y.o. male  with history of spinal infarct resulting in quadriplegia s/p colostomy,s/p suprapubic catheter c/b decubitus ulcer, and pelvic osteomyelitis. He was decribed  In December 2015, discharged on vancomycin and followed by Dr Tommy Medal in the ID clinic. He was hospitalized in early february  for having worsening fouls smelling drainage to large decubitus ulcer of ischium. MRI done at that time that should fluid collection that required I x D by Dr. Migdalia Dk.He was treated for ischial osteomyelitis, pneumonia, Klebsiella bacteremia, and Burkhoderia UTI during the hospitalization. Patient was discharged on IV vancomycin plus ertapenem, which are supposed to be continued for total of 6 weeks per ID. In addition to his known decubitus ulcer to sacral and right ischial wounds, he has also developed a large full thickness skin burn on his back measuring 15 x 15 cm as a result of  Friction rub creating a large blister on his back.   Per notes, Patient was doing well until Feb 20th when he started hallucinating and having sleep deprivation. He was admitted on 2/24 for further evaluation. He was seen by neurology, plastics, and wound care. Mother states that patient had hallucination  roughly 3 months ago, which was likely caused by morphine, since his symptoms resolved after discontinuation of morphine. This time, mother discontinued patient's Percocet, Lyrica and baclofen without any help. Patient currently on Vancomycin and Meropenem for his recent positive blood cultures of Klebsiella. Decision was made to discontinue carbapenem and place him on ciprofloxacin, metronidazole plus vancomycin. He was doing well without further hallucinations, and undergoing serial debridement by Dr. Migdalia Dk. He was last taken to hte OR on 3/2 for placement of ACell and VAC in adiion to 2cm of debridement of skin and soft tissue to sacral/ischial ulcers. ( 5 x 5 x 1.5cm sacral and 4 x 6 x3 cm ischial lesion) and adaptic dressing placed on large back lesion. In the last 24hr, he has had increasing solomnence but wakes up crying in pain, startles, and has had <200 ml UOP in the last 8-10hrs. He had temp today of 101.65F which had been the first in several days. His mother reports urine leaking around catheter but then manipulated by nurses last, since then very little UOP. She states that her sone in the past when he becomes soloment could be a sign of new infection. With recent fever, new sent of blood cx sent off.  Patient is unable to provide good history, thus this has been taken from notes and history of mom who cares for him and had been with him over night.  WOC wound consult note on admit:  Stage IV Pressure ulcer sacrum Stage IV Pressure ulcer right ischium  Stage II Pressure ulcer left buttock Full thickness wound back that was 15 x 15 cm Stage III Pressure ulcer left heel: 2.5cm x 1.5cm x 0.1cm Full thickness ulcer left dorsal foot (3cm x 2cm x 0.1c), left lateral fifth toe (1cm x 1.5cm x 0.1cm), dorsal 4th toe (0.5cm x 0.5cm x0.1cm) .  Wound bed: Left foot wounds are all clean, 100% pink granulation tissue, minimal drainage   Past Medical History  Diagnosis Date  . GERD  (gastroesophageal reflux disease)   . Asthma   . Hx MRSA infection     on face  . Gastroparesis   . Diabetic neuropathy   . Seizures   . Family history of anesthesia complication     Pt mother can't have epidural procedures  . Dysrhythmia   . Pneumonia   . Arthritis   . Fibromyalgia   . 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 Type I diabetes mellitus     sees Dr. Loanne Drilling     Allergies:  Allergies  Allergen Reactions  . Cefuroxime Axetil Anaphylaxis  . Ertapenem Other (See Comments)    Rash and confusion  . Morphine And Related Other (See Comments)    Changed mental status, confusion, headache, visual hallucination  . Penicillins Anaphylaxis and Other (See Comments)    ?can take amoxicillin?  . Sulfa Antibiotics Anaphylaxis, Shortness Of Breath and Other (See Comments)  . Tessalon [Benzonatate] Anaphylaxis  . Shellfish Allergy Itching and Other (See Comments)    Took benadryl to alleviate reaction     MEDICATIONS: . antiseptic oral rinse  7 mL Mouth Rinse q12n4p  . atorvastatin  20 mg Oral Daily  . baclofen  10 mg Oral BID WC  . baclofen  20 mg Oral QHS  . chlorhexidine  15 mL Mouth Rinse BID  . feeding supplement (GLUCERNA SHAKE)  237 mL Oral TID BM  . feeding supplement (PRO-STAT SUGAR FREE 64)  30 mL Oral BID  . fentaNYL  25 mcg Transdermal Q72H  . heparin  5,000 Units Subcutaneous 3 times per day  . insulin aspart  0-9 Units Subcutaneous TID WC  . insulin glargine  25 Units Subcutaneous QHS  . levofloxacin (LEVAQUIN) IV  500 mg Intravenous Q24H  . lidocaine  1 application Topical Once per day on Mon Wed Fri  . liver oil-zinc oxide   Topical BID  . metronidazole  500 mg Intravenous Q8H  . mirtazapine  15 mg Oral QHS  . mupirocin ointment  1 application Nasal Once per day on Mon Wed Fri  . pantoprazole  40 mg Oral Daily  . pregabalin  100 mg Oral TID AC  . pregabalin  200 mg Oral QHS  . QUEtiapine  25 mg Oral TID  . sodium chloride  3 mL  Intravenous Q12H  . vancomycin  1,500 mg Intravenous Q24H    History  Substance Use Topics  . Smoking status: Former Smoker -- 12 years    Types: Cigars, Cigarettes    Quit date: 09/07/2014  . Smokeless tobacco: Never Used     Comment: e-cigarette and cigars currently  . Alcohol Use: No     Comment: rarely    Family History  Problem Relation Age of Onset  . Diabetes Father   . Hypertension Father   . Asthma      fhx  . Hypertension  fhx  . Stroke      fhx  . Heart disease Mother     Review of Systems -  Unable to obtain from patient since solomnent  OBJECTIVE: Temp:  [98.2 F (36.8 C)-101.5 F (38.6 C)] 99.8 F (37.7 C) (03/04 1842) Pulse Rate:  [83-104] 104 (03/04 1648) Resp:  [16] 16 (03/04 1422) BP: (93-116)/(63-64) 116/64 mmHg (03/04 1422) SpO2:  [100 %] 100 % (03/04 1422) gen = opens eyes to verbal stimuli but then goes to sleep quickly heent = dry oral mucosa pulm = ctab no w/c/r Cors= tachy, nl s1, nl s2. No g/m/r abd = + ostomy and +suprapubic catheter in place, little urine in foley bag Neuro = lower extremity flaccid. Loss of tone Skin =  Warm, dry, no rash. Wound vac on ischium. Left hip wound wrapped, left foot wrapped  LABS: Results for orders placed or performed during the hospital encounter of 12/01/14 (from the past 48 hour(s))  Glucose, capillary     Status: None   Collection Time: 12/08/14  9:12 PM  Result Value Ref Range   Glucose-Capillary 78 70 - 99 mg/dL   Comment 1 Notify RN   Glucose, capillary     Status: Abnormal   Collection Time: 12/08/14 10:36 PM  Result Value Ref Range   Glucose-Capillary 108 (H) 70 - 99 mg/dL   Comment 1 Notify RN   Glucose, capillary     Status: Abnormal   Collection Time: 12/09/14  6:33 AM  Result Value Ref Range   Glucose-Capillary 178 (H) 70 - 99 mg/dL   Comment 1 Notify RN   Glucose, capillary     Status: Abnormal   Collection Time: 12/09/14 11:52 AM  Result Value Ref Range   Glucose-Capillary  125 (H) 70 - 99 mg/dL   Comment 1 Notify RN   Glucose, capillary     Status: Abnormal   Collection Time: 12/09/14  4:07 PM  Result Value Ref Range   Glucose-Capillary 165 (H) 70 - 99 mg/dL   Comment 1 Notify RN   Glucose, capillary     Status: None   Collection Time: 12/09/14  9:40 PM  Result Value Ref Range   Glucose-Capillary 98 70 - 99 mg/dL  CBC     Status: Abnormal   Collection Time: 12/10/14  4:35 AM  Result Value Ref Range   WBC 14.5 (H) 4.0 - 10.5 K/uL   RBC 2.72 (L) 4.22 - 5.81 MIL/uL   Hemoglobin 7.3 (L) 13.0 - 17.0 g/dL   HCT 22.7 (L) 39.0 - 52.0 %   MCV 83.5 78.0 - 100.0 fL   MCH 26.8 26.0 - 34.0 pg   MCHC 32.2 30.0 - 36.0 g/dL   RDW 17.2 (H) 11.5 - 15.5 %   Platelets 482 (H) 150 - 400 K/uL  Basic metabolic panel     Status: Abnormal   Collection Time: 12/10/14  4:35 AM  Result Value Ref Range   Sodium 138 135 - 145 mmol/L   Potassium 3.4 (L) 3.5 - 5.1 mmol/L   Chloride 112 96 - 112 mmol/L   CO2 22 19 - 32 mmol/L   Glucose, Bld 127 (H) 70 - 99 mg/dL   BUN 7 6 - 23 mg/dL   Creatinine, Ser 0.68 0.50 - 1.35 mg/dL   Calcium 9.1 8.4 - 10.5 mg/dL   GFR calc non Af Amer >90 >90 mL/min   GFR calc Af Amer >90 >90 mL/min    Comment: (NOTE) The eGFR has  been calculated using the CKD EPI equation. This calculation has not been validated in all clinical situations. eGFR's persistently <90 mL/min signify possible Chronic Kidney Disease.    Anion gap 4 (L) 5 - 15  Glucose, capillary     Status: Abnormal   Collection Time: 12/10/14  6:28 AM  Result Value Ref Range   Glucose-Capillary 127 (H) 70 - 99 mg/dL  Glucose, capillary     Status: Abnormal   Collection Time: 12/10/14 11:42 AM  Result Value Ref Range   Glucose-Capillary 174 (H) 70 - 99 mg/dL  Glucose, capillary     Status: Abnormal   Collection Time: 12/10/14  4:24 PM  Result Value Ref Range   Glucose-Capillary 205 (H) 70 - 99 mg/dL  Culture, blood (routine x 2)     Status: None (Preliminary result)    Collection Time: 12/10/14  5:00 PM  Result Value Ref Range   Specimen Description BLOOD LEFT ARM    Special Requests BOTTLES DRAWN AEROBIC ONLY 1 CC    Culture PENDING    Report Status PENDING   Urinalysis, Routine w reflex microscopic     Status: Abnormal   Collection Time: 12/10/14  6:12 PM  Result Value Ref Range   Color, Urine YELLOW YELLOW   APPearance CLEAR CLEAR   Specific Gravity, Urine 1.015 1.005 - 1.030   pH 6.0 5.0 - 8.0   Glucose, UA NEGATIVE NEGATIVE mg/dL   Hgb urine dipstick SMALL (A) NEGATIVE   Bilirubin Urine NEGATIVE NEGATIVE   Ketones, ur NEGATIVE NEGATIVE mg/dL   Protein, ur NEGATIVE NEGATIVE mg/dL   Urobilinogen, UA 0.2 0.0 - 1.0 mg/dL   Nitrite NEGATIVE NEGATIVE   Leukocytes, UA TRACE (A) NEGATIVE  Urine microscopic-add on     Status: Abnormal   Collection Time: 12/10/14  6:12 PM  Result Value Ref Range   Squamous Epithelial / LPF RARE RARE   WBC, UA 3-6 <3 WBC/hpf   RBC / HPF 0-2 <3 RBC/hpf   Bacteria, UA MANY (A) RARE   Urine-Other RARE YEAST     MICRO: 2/24 blood cx x 1 ngtd 2/25 urine cx x ngtd  Historic micro: 2/1 blood cx x 2: kleb pneumo (amp R, but otherwise sensitive) 2/1 urine cx burkholderia ( FQ intermediate) IMAGING: Dg Chest Port 1 View  12/10/2014   CLINICAL DATA:  Fevers, weakness  EXAM: PORTABLE CHEST - 1 VIEW  COMPARISON:  2/20 5/6 seen  FINDINGS: Cardiac shadow is prominent but accentuated by the portable technique. A right-sided PICC line is again seen in the superior right atrium. The lungs are well aerated bilaterally. Increased density is noted in the left retrocardiac region likely representing acute infiltrate.  IMPRESSION: Left retrocardiac infiltrate.   Electronically Signed   By: Inez Catalina M.D.   On: 12/10/2014 17:23    Assessment/Plan:  29yo M with spinal cord injury, paraplegia s/p colostomy s/p suprapubic catheter who has been undergoing surgical debridement and IV antibiotics for ischial/sacral osteomyelitis, in  addition to care for decubitus ulcer to back, left hip, and left foot. He was receiving vancomycin and meropenem for kleb pneumonia bacteremia, presumed involvement in osteomyelitis but had developed hallucinations nearly 3 weeks into the course. He is now on cipro, metronidazole plus vancomycin for the past 7 days. He has 1 day history of solomnence, fever and possible urinary obstruction.  Pelvic osteo- continue with ciprofloxacin, metronidazole and vancomycin Fever - repeat blood cx, ua, urine cx (From new suprapubic catheter), and cxr as part  of new fever work up - if he has persistent fevers would change cipro to aztreonam. He has anaphylaxis reported to cephalosporins, and hallunications to carbapenem thus have limited choices for him - he is currently day 30 of 42 of IV therapy for osteomyelitis Neurogenic bladder= his isolated fever could possibly be due to urinary retention for recent catheter manipulation. It is planned for exchange this evening. - decub wounds = defer to wound care and plastic surgery for recommendations on management  Dr. Linus Salmons to provide further [place] [phone]

## 2014-12-10 NOTE — Discharge Summary (Signed)
Discharge Summary  Jason [name] [place] PIR:[id number] DOB: 1986-02-01  PCP: [name], MD  Admit date: 12/01/2014 Discharge date: 12/10/2014  Time spent: >43mins  Recommendations for Outpatient Follow-up:  1. F/u with pcp in 1week, pcp to repeat cbc/bmp in one week. pcp to coordinate patient's care. Complicated case, patient on long term iv abx through picc line, needing multiple subspecialty care. 2. F/u with urology, for superpubic catheter care 3. F/u with cardiology for palpitation, to discuss holter monitor 4. F/u with infectious disease 5. F/u with wound care center 6. Continue home health   Discharge Diagnoses:  Active Hospital Problems   Diagnosis Date Noted  . Acute encephalopathy 07/18/2014  . Disorientation 12/01/2014  . Burkholderia cepacia infection   . Anemia 10/22/2014  . Osteomyelitis, pelvis   . Stage IV decubitus ulcer   . Sacral pressure sore 08/14/2014  . UTI (urinary tract infection) 07/18/2014  . Tetraplegia 04/19/2014  . Neurogenic bladder 04/19/2014  . Spinal cord infarction (history of) 02/05/2014  . Protein-calorie malnutrition, severe 09/24/2013  . Gastroparesis due to DM 06/02/2013  . GERD 09/15/2007  . Type 1 diabetes, uncontrolled, with neuropathy 05/29/2007  . Asthma 05/29/2007    Resolved Hospital Problems   Diagnosis Date Noted Date Resolved  No resolved problems to display.    Discharge Condition: stable, much improved  Diet recommendation: heart healthy/carb modified  Filed Weights   12/05/14 0500 12/08/14 0635 12/09/14 0500  Weight: 56.7 kg (125 lb) 56.7 kg (125 lb) 58.968 kg (130 lb)    History of present illness:  LAURENT CARGILE is a 29 y.o. 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  Patient was recently hospitalized  from 2/1 to 2/12. He was treated for ischial osteomyelitis, pneumonia, Klebsiella bacteremia, and Burkhoderia UTI in hospital. Patient was discharged on IV vancomycin plus ertapenem, which are supposed to be continued for total of 6 weeks per ID. Patient has been doing okay until 3 days ago when he started hallucinating. It seems that he is on the phone talking to somebody who is not there. Mother reports pt has not been sleeping at all in the past 3 days. Mother states that patient had hallucination 3 months ago, which was likely caused by morphine, since his symptoms resolved after discontinuation of morphine. This time, mother discontinued patient's Percocet, Lyrica and baclofen without any help. His mother states that patient developed new rashes over his forehead and upper face. He has back pain. No headache, neck rigidity or photophobia. Patient denies fever, chills, cough, chest pain, SOB, nausea, vomiting, abdominal pain, diarrhea, dysuria, urgency, frequency, hematuria or leg swelling.   In ED, patient was found to have negative CT head for acute abnormalities. Leukocytosis with WBC 13.0, negative UDS, lactate 1.06, positive urinalysis for moderate leukocytes, negative salicylate and acetaminophen level, ammonia 17, temperature normal. Electrolytes okay. Patient's admitted to inpatient for further evaluation and treatment.  Hospital Course:  Principal Problem:   Acute encephalopathy Active Problems:   Type 1 diabetes, uncontrolled, with neuropathy   Asthma   GERD   Gastroparesis due to DM   Protein-calorie malnutrition, severe   Spinal cord infarction (history of)   Tetraplegia   Neurogenic bladder   UTI (urinary tract infection)   Sacral pressure sore   Stage IV decubitus ulcer   Osteomyelitis, pelvis   Anemia  Burkholderia cepacia infection   Disorientation   Altered mental status- much improved  Back to baseline at discharge,  patient has complicated history with pneumonia  ischial osteomyelitis, Klebsiella bacteremia and Burkhoderia UTI. Dr. Michel Bickers recommends to stop meropenem and start Flagyl as well as Levaquin along with vancomycin.  2. Hallucinations - resolved. started on Seroquel 25 mg 3 times a day per psych. Patient's mom requested to stop prn haldol and ativan. Patient's mom reported good response from seroquel. 3. 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 It showed multiple food collections within the abductor magnus and abductor brevis muscles likely consistent with intramuscular abscesses. Patient was seen by plastic surgery underwent debridement on 11/12/2014 11/18/2014 and 11/25/2014 with VAC placement. Patient started on vancomycin and ertapenem as per infectious disease now the ertapenem was changed to meropenem. Blood culture urine culture has been collected. We'll follow the culture results, continue vancomycin, Flagyl, Levaquin. Will discontinue meropenem. Will start fentanyl patch 25 g every 72 hours. decubitus ulcer debrided again by plastic surgery on 3/2. Restart baclofen and lyrica on 3/3 for pain control. 4. Klebsiella pneumonia bacteremia- blood culture was positive for Klebsiella pneumonia, currently patient on levaquin/flagyl/ vancomycin 5. Burkholderia UTI- patient has been treated for UTI. No need to treat with more antibiotics as per ID. 6. Type 1 diabetes mellitus- will change the Lantus to 25 units subcutaneous daily. Continue sliding scale insulin 7. Functional quadriplegia, neurogenic bladder- bedbound, secondary to spinal cord infarction. 8. superpubic catheter, reported urine leak on 3/3 night, no leak, catheter drain well at discharge,  Talked to urology [name], he recommended close outpatient urology follow up. 9. Intermittent palpitation, reported started while he was in the hospital, non in the last two days. Outpatient  cardiology follow up arranged to discuss holter monitor. 10. DVT prophylaxis- heparin  Code Status: Full code Family Communication: *Spoke to patient's mother at bedside Disposition Plan: Home with home health    Consultants:  Psychiatry  Neurology  Plastic surgery  Phone coversation with ID  Procedures:  debridement of decubitus ulcer 3/2  Antibiotics:  Vancomycin/flagyl/levaquin through PICC line till 3/25 to finish total of 6wks treatment  Meropenem stopped during this hospitalization per ID recommendation.   Discharge Exam: BP 101/64 mmHg  Pulse 85  Temp(Src) 98.2 F (36.8 C) (Axillary)  Resp 16  Ht 5\' 8"  (1.727 m)  Wt 58.968 kg (130 lb)  BMI 19.77 kg/m2  SpO2 100%   General: no acute distress, very pleasant , fully alert and appropriate, in good spirit today  Cardiovascular: S1-S2 regular  Respiratory: Clear bilaterally  Abdomen: Soft nontender no organomegaly, +colostomy . +Wound vac  Musculoskeletal: No edema , quadriplegia  Skin: multiple chronic wounds, please see surgery and wound care note     Discharge Instructions You were cared for by a hospitalist during your hospital stay. If you have any questions about your discharge medications or the care you received while you were in the hospital after you are discharged, you can call the unit and asked to speak with the hospitalist on call if the hospitalist that took care of you is not available. Once you are discharged, your primary care physician will handle any further medical issues. Please note that NO REFILLS for any discharge medications will be authorized once you are discharged, as it is imperative that you return to your primary care physician (or establish a relationship with a primary care physician if you  do not have one) for your aftercare needs so that they can reassess your need for medications and monitor your lab values.      Discharge Instructions    Diet - low sodium heart  healthy    Complete by:  As directed      Face-to-face encounter (required for Medicare/Medicaid patients)    Complete by:  As directed   I Keara Pagliarulo certify that this patient is under my care and that I, or a nurse practitioner or physician's assistant working with me, had a face-to-face encounter that meets the physician face-to-face encounter requirements with this patient on 12/10/2014. The encounter with the patient was in whole, or in part for the following medical condition(s) which is the primary reason for home health care (List medical condition): quadraplegia/osteomyelitis/decubitus ulcer  The encounter with the patient was in whole, or in part, for the following medical condition, which is the primary reason for home health care:  quadraplegia/osteomyelitis/decubitus ulcer  I certify that, based on my findings, the following services are medically necessary home health services:  Nursing  Reason for Medically Necessary Home Health Services:  Skilled Nursing- Change/Decline in Patient Status  My clinical findings support the need for the above services:  Bedbound  Further, I certify that my clinical findings support that this patient is homebound due to:  Open/draining pressure/stasis ulcer     Home Health    Complete by:  As directed   To provide the following care/treatments:   [place] work       Increase activity slowly    Complete by:  As directed             Medication List    TAKE these medications        acetaminophen 500 MG tablet  Commonly known as:  TYLENOL  Take 1,000 mg by mouth every 6 (six) hours as needed for headache.     albuterol (2.5 MG/3ML) 0.083% nebulizer solution  Commonly known as:  PROVENTIL  Take 3 mLs (2.5 mg total) by nebulization every 4 (four) hours as needed for wheezing or shortness of breath.     atorvastatin 20 MG tablet  Commonly known as:  LIPITOR  Take 20 mg by mouth daily.     baclofen 10 MG tablet  Commonly  known as:  LIORESAL  Take 1-2 tablets (10-20 mg total) by mouth 3 (three) times daily. Takes 10mg  in morning and lunchtime and takes 20mg  at bedtime     collagenase ointment  Commonly known as:  SANTYL  Apply 1 application topically daily.     dexlansoprazole 60 MG capsule  Commonly known as:  DEXILANT  Take 1 capsule (60 mg total) by mouth daily.     diclofenac sodium 1 % Gel  Commonly known as:  VOLTAREN  Apply 2 g topically daily as needed (for pain).     diphenoxylate-atropine 2.5-0.025 MG per tablet  Commonly known as:  LOMOTIL  Take 1 tablet by mouth 4 (four) times daily as needed for diarrhea or loose stools.     feeding supplement (GLUCERNA SHAKE) Liqd  Take 237 mLs by mouth 3 (three) times daily between meals.     feeding supplement (GLUCERNA SHAKE) Liqd  Take 237 mLs by mouth 3 (three) times daily between meals.     fentaNYL 25 MCG/HR patch  Commonly known as:  DURAGESIC - dosed mcg/hr  Place 1 patch (25 mcg total) onto the skin every 3 (three) days.  glucagon 1 MG injection  Commonly known as:  GLUCAGON EMERGENCY  Inject 1 mg into the vein once as needed.     insulin aspart 100 UNIT/ML injection  Commonly known as:  novoLOG  Inject 4 Units into the skin 3 (three) times daily before meals.     insulin glargine 100 UNIT/ML injection  Commonly known as:  LANTUS  Inject 0.25 mLs (25 Units total) into the skin at bedtime.     levofloxacin 500 MG/100ML Soln  Commonly known as:  LEVAQUIN  To finish iv antibiotic on 3/25 for total of 6wks (iv abx started on 2/12), call infectious disease Dr. Lucianne Lei dam for questions.     lidocaine 2 % jelly  Commonly known as:  XYLOCAINE  Apply 1 application topically 3 (three) times a week.     lidocaine 5 % ointment  Commonly known as:  XYLOCAINE  Apply 1 application topically as needed (mouth sores).     liver oil-zinc oxide 40 % ointment  Commonly known as:  DESITIN  Apply topically 2 (two) times daily.     loratadine  10 MG tablet  Commonly known as:  CLARITIN  Take 10 mg by mouth daily.     metroNIDAZOLE 5-0.79 MG/ML-% IVPB  Commonly known as:  FLAGYL  To finish abx treatment on 3/25 ( abx started on 2/12), call infectious disease for questions.     midodrine 5 MG tablet  Commonly known as:  PROAMATINE  Take 5 mg by mouth as needed (Systolic blood pressure <10).     mupirocin ointment 2 %  Commonly known as:  BACTROBAN  Place 1 application into the nose 3 (three) times a week.     ondansetron 4 MG tablet  Commonly known as:  ZOFRAN  Take 4 mg by mouth every 8 (eight) hours as needed for nausea or vomiting.     oxyCODONE-acetaminophen 10-325 MG per tablet  Commonly known as:  PERCOCET  Take 1 tablet by mouth every 8 (eight) hours.     oxyCODONE-acetaminophen 5-325 MG per tablet  Commonly known as:  PERCOCET/ROXICET  Take 1-2 tablets by mouth every 4 (four) hours as needed for severe pain.     potassium chloride SA 20 MEQ tablet  Commonly known as:  KLOR-CON M20  Take 1 tablet (20 mEq total) by mouth daily.     pregabalin 100 MG capsule  Commonly known as:  LYRICA  Take 1-2 capsules (100-200 mg total) by mouth 4 (four) times daily. Take 100 mg by mouth in the morning, take 100 mg by mouth in the afternoon, take 100 mg by mouth in the evening and take 200 mg by mouth at bedtime.     pregabalin 200 MG capsule  Commonly known as:  LYRICA  Take 1 capsule (200 mg total) by mouth at bedtime.     Protein Powd  Take 1 scoop by mouth 2 (two) times daily. Add whey powder to yogurt as a protein supplement.     QUEtiapine 25 MG tablet  Commonly known as:  SEROQUEL  Take 1 tablet (25 mg total) by mouth 3 (three) times daily.     sodium chloride 0.65 % Soln nasal spray  Commonly known as:  OCEAN  Place 1 spray into both nostrils as needed for congestion.     vancomycin 1 GM/200ML Soln  Commonly known as:  VANCOCIN  Inject 200 mLs (1,000 mg total) into the vein daily.     vancomycin 1,500 mg  in sodium  chloride 0.9 % 500 mL  Inject 1,500 mg into the vein daily. vanc level monitor by home health/infectious disease       Allergies  Allergen Reactions  . Cefuroxime Axetil Anaphylaxis  . Ertapenem Other (See Comments)    Rash and confusion  . Morphine And Related Other (See Comments)    Changed mental status, confusion, headache, visual hallucination  . Penicillins Anaphylaxis and Other (See Comments)    ?can take amoxicillin?  . Sulfa Antibiotics Anaphylaxis, Shortness Of Breath and Other (See Comments)  . Tessalon [Benzonatate] Anaphylaxis  . Shellfish Allergy Itching and Other (See Comments)    Took benadryl to alleviate reaction   Follow-up Information    Follow up with [facility].   Why:  [name] will resume patient care , will contact you with start date and time.   Contact information:   [address] [place] [address] [phone]       Follow up with FRY,STEPHEN A, MD In 1 week.   Specialty:  Family Medicine   Contact information:   [place] [address] [phone]       Follow up with [name], MD In 2 weeks.   Specialty:  Infectious Diseases   Contact information:   [address] [place] [address] [phone]       Follow up with Laverda Page, MD In 1 week.   Specialty:  Cardiology   Why:  to discuss holter monitor due to itnermittent palpitation/chest discomfort   Contact information:   [address] [place] [address] [phone]        The results of significant diagnostics from this hospitalization (including imaging, microbiology, ancillary and laboratory) are listed below for reference.    Significant Diagnostic Studies: Ct Head Wo Contrast  12/01/2014   CLINICAL DATA:  Allergic reaction to pain meds withdrawal. Pt has had hallucinations and altered mental status x 3 days per mom  EXAM: CT HEAD WITHOUT CONTRAST  TECHNIQUE:  Contiguous axial images were obtained from the base of the skull through the vertex without intravenous contrast.  COMPARISON:  Head CT 07/29/2014, MRI 11 07/2014  FINDINGS: No acute intracranial hemorrhage. No focal mass lesion. No CT evidence of acute infarction. No midline shift or mass effect. No hydrocephalus. Basilar cisterns are patent. Paranasal sinuses and mastoid air cells are clear.  IMPRESSION: History no acute   Electronically Signed   By: Suzy Bouchard M.D.   On: 12/01/2014 18:32   Ct Chest W Contrast  11/15/2014   CLINICAL DATA:  Pleural effusion. Left lower lobe opacity on chest radiograph. Increasing white count on appropriate antibiotics.  EXAM: CT CHEST WITH CONTRAST  TECHNIQUE: Multidetector CT imaging of the chest was performed during intravenous contrast administration.  CONTRAST:  59mL OMNIPAQUE IOHEXOL 300 MG/ML  SOLN  COMPARISON:  Chest 11/15/2014.  FINDINGS: Focal areas of consolidation in both lung bases, greater on the left, with small left pleural effusion. No evidence of loculated fluid to suggest empyema. Air bronchograms are present with suggestion of mild bronchiectasis. Findings likely represent multifocal pneumonia. No pneumothorax.  Normal heart size. Normal caliber thoracic aorta. Great vessel origins are patent. No significant lymphadenopathy in the chest. Esophagus is mildly dilated with fluid and debris demonstrated throughout the esophagus. This may be due to reflux or dysmotility.  Included portions of the upper abdominal organs are grossly unremarkable. No destructive bone lesions.  IMPRESSION: Focal  areas of consolidation in both lung bases, greater on the left. Small left pleural effusion. Findings likely to represent pneumonia. No evidence of empyema. Mildly dilated esophagus filled with fluid and debris. This could be due to reflux or dysmotility.   Electronically Signed   By: Lucienne Capers M.D.   On: 11/15/2014 22:08   Mr Pelvis W Wo Contrast  11/10/2014    CLINICAL DATA:  Quadriplegia. Sacral decubitus ulcer. Sepsis. Foul-smelling discharge from the decubitus ulcer.  EXAM: MRI PELVIS WITHOUT AND WITH CONTRAST  TECHNIQUE: Multiplanar multisequence MR imaging of the pelvis was performed both before and after administration of intravenous contrast.  CONTRAST:  35mL MULTIHANCE GADOBENATE DIMEGLUMINE 529 MG/ML IV SOLN  COMPARISON:  10/02/2014  FINDINGS: Again noted are decubitus ulcers over the ischial tuberosities, right larger than left. The right decubitus ulcer extends down to the ischial tuberosity with underlying marrow edema and cortical irregularity consistent with osteomyelitis. There is mild diffuse edema of the pelvic musculature with mild enhancement. There are interval development of multiple small nonenhancing fluid collections within the right adductor magnus and adductor brevis muscles extending from the right ischial tuberosity most consistent with intramuscular abscesses. The conglomeration of the area measures approximately 4.8 x 2 cm, but each individual collection measures significantly smaller than this. There is a rind of nonenhancement around the wound consistent with necrosis.  There is no marrow signal abnormality involving the left ischial tuberosity.  Bilateral hips demonstrate no fracture, dislocation or avascular necrosis. There is a small right hip joint effusion with mild synovial enhancement. There is no bursa formation. There is generalized edema within the subcutaneous fat throughout the pelvis.  IMPRESSION: 1. Again noted is a decubitus ulcer over the ischial tuberosities bilaterally, right larger than left. The right decubitus ulcer extends down to the ischial tuberosity with underlying marrow edema, cortical irregularity and enhancement consistent with osteomyelitis similar in appearance to the prior exam. There is mild diffuse edema throughout the pelvic musculature with mild enhancement with interval development of small nonenhancing  fluid collections within the right adductor magnus and adductor brevis muscles extending from the right ischial tuberosity most consistent with intramuscular abscesses. 2. Small right joint effusion with mild synovial enhancement. Septic arthritis cannot be excluded. If there is clinical concern recommend arthrocentesis. 3. Decubitus ulcer overlying the left ischial tuberosity without underlying osseous abnormality. 4. There is diffuse mild edema of the pelvic musculature which is likely neurogenic. There is a concern of an element of infectious myositis especially in the right adductor and gluteal compartment.   Electronically Signed   By: Kathreen Devoid   On: 11/10/2014 17:12   Dg Chest Port 1 View  12/02/2014   CLINICAL DATA:  Shortness of breath.  PICC in place.  EXAM: PORTABLE CHEST - 1 VIEW  COMPARISON:  11/19/2014  FINDINGS: Tip of the right upper extremity PICC in the mid-distal SVC. Cardiomediastinal contours are normal. Decreased left basilar airspace disease with minimal residual linear opacities. The lungs are otherwise clear. There is no pleural effusion or pneumothorax. Unchanged appearance of the right distal clavicle with probable posttraumatic deformity.  IMPRESSION: 1. Tip of the right upper extremity PICC in the mid distal SVC. 2. Improved left basilar aeration with minimal residual linear opacity, likely atelectasis.   Electronically Signed   By: Jeb Levering M.D.   On: 12/02/2014 03:28   Dg Chest Port 1 View  11/19/2014   CLINICAL DATA:  PICC line placement.  Pneumonia.  EXAM: PORTABLE CHEST - 1  VIEW  COMPARISON:  11/15/2014  FINDINGS: Right arm PICC line tip overlies the distal SVC.  Decreased airspace disease is seen in the left retrocardiac lung base. There is mild persistent atelectasis or infiltrate seen in the medial right lung base. No evidence of pleural effusion. Heart size is normal.  IMPRESSION: Right arm PICC line tip overlies the distal SVC.  Decreased airspace disease in  left retrocardiac lung base. Mild atelectasis or infiltrate persists in medial right lung base.   Electronically Signed   By: Earle Gell M.D.   On: 11/19/2014 15:28   Dg Chest Port 1 View  11/15/2014   CLINICAL DATA:  Subsequent encounter for pneumonia.  EXAM: PORTABLE CHEST - 1 VIEW  COMPARISON:  11/08/2014  FINDINGS: The right PICC line is stable. The tip is near the cavoatrial junction. The cardiac silhouette, mediastinal and hilar contours are within normal limits and stable. Persistent left lower lobe process, likely pneumonia and small parapneumonic effusion. The right lung remains clear.  IMPRESSION: Persistent left lower lobe process likely a combination of infiltrate and effusion.   Electronically Signed   By: Kalman Jewels M.D.   On: 11/15/2014 12:47   Dg Hand Complete Left  11/17/2014   CLINICAL DATA:  Nonhealing ulcer in left middle finger.  EXAM: LEFT HAND - COMPLETE 3+ VIEW  COMPARISON:  None.  FINDINGS: There is no evidence of fracture or dislocation. No lytic destruction is seen to suggest osteomyelitis. There is no evidence of arthropathy or other focal bone abnormality. Vascular calcifications are noted.  IMPRESSION: No significant abnormality seen in the left hand.   Electronically Signed   By: Marijo Conception, M.D.   On: 11/17/2014 11:49   Dg Foot Complete Left  11/17/2014   CLINICAL DATA:  Nonhealing diabetic ulcers.  EXAM: LEFT FOOT - COMPLETE 3+ VIEW  COMPARISON:  None.  FINDINGS: There is no evidence of fracture or dislocation. There is no evidence of arthropathy or other focal bone abnormality. Vascular calcifications are noted. No lytic destruction is seen to suggest osteomyelitis.  IMPRESSION: No evidence of osteomyelitis.   Electronically Signed   By: Marijo Conception, M.D.   On: 11/17/2014 11:58   Dg Foot Complete Right  11/17/2014   CLINICAL DATA:  Diabetic ulcers.  EXAM: RIGHT FOOT COMPLETE - 3+ VIEW  COMPARISON:  None.  FINDINGS: There is no evidence of fracture or  dislocation. There is no evidence of arthropathy or other focal bone abnormality. Vascular calcifications are noted. No radiopaque foreign body is noted. No lytic destruction is seen to suggest osteomyelitis.  IMPRESSION: No acute abnormality seen in the right foot.   Electronically Signed   By: Marijo Conception, M.D.   On: 11/17/2014 11:55   Dg Fluoro Guide Ndl Plc/bx  11/15/2014   CLINICAL DATA:  RIGHT HIP ASPIRATION INFECTION. PATIENT HAS DECUBITUS ULCERS.  EXAM: RIGHT HIP ASPIRATION UNDER FLUOROSCOPY  FLUOROSCOPY TIME:  If the device does not provide the exposure index:  Fluoroscopy Time (in minutes and seconds):  7 SECONDS  Number of Acquired Images:  0  PROCEDURE: Overlying skin prepped with Betadine, draped in the usual sterile fashion, and infiltrated locally with buffered Lidocaine. Curved 22 gauge spinal needle advanced to the superolateral margin of the RIGHT femoral head. Diagnostic injection of iodinated contrast demonstrates intra-articular spread without intravascular component.  Aspiration was attempted with no return of fluid. Lavage of the right hip joint was performed with 5 mL of sterile saline with subsequent aspiration  of 1 mL of saline from the right hip joint. The spinal needle was removed.  The patient tolerated the procedure well. There were no immediate complications. Hemostasis was achieved.  IMPRESSION: Technically successful right hip aspiration under fluoroscopy. 1 mL of clear right hip joint aspirate was sent for laboratory analysis.   Electronically Signed   By: Kathreen Devoid   On: 11/15/2014 11:34    Microbiology: Recent Results (from the past 240 hour(s))  Blood culture (routine x 2)     Status: None   Collection Time: 12/01/14  4:05 PM  Result Value Ref Range Status   Specimen Description BLOOD  LAC  Final   Special Requests BOTTLES DRAWN AEROBIC AND ANAEROBIC 5ML EACH  Final   Culture   Final    NO GROWTH 5 DAYS Performed at [facility]    Report Status  12/07/2014 FINAL  Final  Urine culture     Status: None   Collection Time: 12/02/14  2:25 AM  Result Value Ref Range Status   Specimen Description URINE, CATHETERIZED  Final   Special Requests NONE  Final   Colony Count NO GROWTH Performed at [facility]   Final   Culture NO GROWTH Performed at [facility]   Final   Report Status 12/03/2014 FINAL  Final  Surgical pcr screen     Status: None   Collection Time: 12/08/14  8:39 AM  Result Value Ref Range Status   MRSA, PCR NEGATIVE NEGATIVE Final   Staphylococcus aureus NEGATIVE NEGATIVE Final    Comment:        The Xpert SA Assay (FDA approved for NASAL specimens in patients over 31 years of age), is one component of a comprehensive surveillance program.  Test performance has been validated by [facility] for patients greater than or equal to 55 year old. It is not intended to diagnose infection nor to guide or monitor treatment.      Labs: Basic Metabolic Panel:  Recent Labs Lab 12/06/14 0535 12/08/14 0514 12/10/14 0435  NA 137 137 138  K 4.3 3.8 3.4*  CL 112 112 112  CO2 21 22 22   GLUCOSE 246* 86 127*  BUN 12 8 7   CREATININE 0.83 0.62 0.68  CALCIUM 9.4 9.2 9.1   Liver Function Tests: No results for input(s): AST, ALT, ALKPHOS, BILITOT, PROT, ALBUMIN in the last 168 hours. No results for input(s): LIPASE, AMYLASE in the last 168 hours. No results for input(s): AMMONIA in the last 168 hours. CBC:  Recent Labs Lab 12/08/14 0514 12/10/14 0435  WBC 13.5* 14.5*  HGB 7.3* 7.3*  HCT 22.8* 22.7*  MCV 84.1 83.5  PLT 416* 482*   Cardiac Enzymes: No results for input(s): CKTOTAL, CKMB, CKMBINDEX, TROPONINI in the last 168 hours. BNP: BNP (last 3 results) No results for input(s): BNP in the last 8760 hours.  ProBNP (last 3 results)  Recent Labs  01/29/14 2113  PROBNP 1343.0*    CBG:  Recent Labs Lab 12/09/14 1152 12/09/14 1607 12/09/14 2140 12/10/14 0628 12/10/14 1142    GLUCAP 125* 165* 98 127* 174*       Signed:  Thoma Paulsen  [facility] 12/10/2014, 2:28 PM

## 2014-12-10 NOTE — Progress Notes (Addendum)
Notified Dr. Erlinda Hong of patient's temperature of 101.5. Discharge cancelled. No further orders at this time per Dr. Erlinda Hong.

## 2014-12-10 NOTE — Care Management Note (Addendum)
CARE MANAGEMENT NOTE 12/10/2014  Patient:  Jason [name], Jason [name]   Account Number:  [id number]  Date Initiated:  12/10/2014  Documentation initiated by:  Ricki Miller  Subjective/Objective Assessment:   29 yr old male admitted with right ischial sacral wound. patient has hx. of spinal stroke , enchephalopathy. Bedbound.     Action/Plan:   Case manager spoke with patient and mom concerning discharge needs. Mom states patient will return home with family care.Have received HH through [facility] and will continue to do so. IV antibiotics at discharge. Not interested in SNF.   Anticipated DC Date:  12/10/2014   Anticipated DC Plan:  [place]  CM consult      [facility] Choice  HOME HEALTH   Choice offered to / List presented to:  C-1 Patient        [place] arranged  HH-1 RN  IV Antibiotics PT OT Aide       [place]   Status of service:  In process, will continue to follow Medicare Important Message given?   (If response is "NO", the following Medicare IM given date fields will be blank) Date Medicare IM given:   Medicare IM given by:   Date Additional Medicare IM given:   Additional Medicare IM given by:    Discharge Disposition:  [place]

## 2014-12-10 NOTE — Progress Notes (Signed)
Notified Dr. Erlinda Hong that patient very drowsy and only 140cc out this shift.

## 2014-12-10 NOTE — Progress Notes (Signed)
Plastic Surgery Wound Care Follow up  Pt should follow up in the [facility] at [facility] in 2 weeks with Dr. Migdalia Dk. Please call the clinic to schedule the appointment [place] RN should change the VAC dressing to the sacrum starting next Wednesday if the dressing will stay intact that long. If the dressing starts to leak, they will have to change it earlier.   Sundee Garland,PA-C Plastic Surgery [phone]

## 2014-12-10 NOTE — Progress Notes (Signed)
[facility] PROGRESS NOTE  Jason [name] DXA:[id number] DOB: Sep 03, 1986 DOA: 12/01/2014 PCP: [name], MD  Assessment/Plan:   fever: patient was admitted with altered mental status, skin wound, was doing better this morning, getting ready to discharge home, however, spiking fever this pm, discharge cancelled, pan culture ordered. ID consulted.   Altered mental status- much improved , mom reported patient back to his baseline , patient has complicated history with pneumonia ischial osteomyelitis, Klebsiella bacteremia and Burkhoderia UTI.  Dr. Michel Bickers recommends to stop meropenem and start Flagyl as well as Levaquin along with vancomycin.  Hallucinations - no hallucination since 3/3, improved after patient was started on Seroquel 25 mg 3 times a day per psych. Patient's mom requested to stop prn haldol and ativan. 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 It showed multiple food collections within the abductor magnus and abductor brevis muscles likely consistent with intramuscular abscesses. Patient was seen by plastic surgery underwent debridement on 11/12/2014 11/18/2014 and 11/25/2014 with VAC placement. Patient started on vancomycin and ertapenem as per infectious disease now the ertapenem was changed to meropenem. Blood culture urine culture has been collected. We'll follow the culture results, continue vancomycin, Flagyl, Levaquin. Will discontinue meropenem. Will start fentanyl patch 25 g every 72 hours. decubitus ulcer debrided again by plastic surgery on 3/2. Restart baclofen and lyrica on 3/3 for pain control.  Klebsiella pneumonia bacteremia- blood culture  was positive for Klebsiella pneumonia, currently patient on levaquin/flagyl/ vancomycin  Burkholderia UTI- patient has been treated for UTI. No need to treat  with more antibiotics as per ID.  Type 1 diabetes mellitus- will change the Lantus to 25 units subcutaneous daily. Continue sliding scale insulin  Functional quadriplegia, neurogenic bladder- bedbound, secondary to spinal cord infarction. Reported leaking urine from superpubic catheter last night, no more urine leakage today on 3/4. Urology consulted, talked to [name] over the phone, he will put in ordered.  DVT prophylaxis- heparin  Code Status: Full code Family Communication: *Spoke to patient's mother at bedside Disposition Plan: Home with home health when stable   Consultants:  Psychiatry  Neurology  Plastic surgery   ID  urology  Procedures:   debridement of decubitus ulcer 3/2  Antibiotics:  Vancomycin/flagyl/levaquin  Meropenem stopped  HPI/Subjective: 29 y.o. 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 Patient was recently hospitalized from 2/1 to 2/12. He was treated for ischial osteomyelitis, pneumonia, Klebsiella bacteremia, and Burkhoderia UTI in hospital. Patient was discharged on IV vancomycin plus ertapenem, which are supposed to be continued for total of 6 weeks per ID. Patient has been doing okay until 3 days ago when he started hallucinating. It seems that he is on the phone talking to somebody who is not there. Mother reports pt has not been sleeping at all in the past 3 days. Mother states that patient had hallucination 3 months ago, which was likely caused by morphine, since his symptoms resolved after discontinuation of morphine. This time, mother discontinued patient's Percocet, Lyrica and baclofen without any help. His mother states that patient developed new rashes over his forehead and upper face. He has back pain. No headache, neck rigidity or  photophobia. Patient denies fever, chills, cough, chest pain, SOB, nausea, vomiting,  abdominal pain, diarrhea, dysuria, urgency, frequency, hematuria or leg swelling.  In ED, patient was found to have negative CT head for acute abnormalities. Leukocytosis with WBC 13.0, negative UDS, lactate 1.06, positive urinalysis for moderate leukocytes, negative salicylate and acetaminophen level, ammonia 17, temperature normal. Electrolytes okay. Patient's admitted to inpatient for further evaluation and treatment.  patient was doing well on 3/4 was getting ready to be discharged, however spiked fever in the afternoon, discharge cancelled.  Objective: Filed Vitals:   12/10/14 1602  BP:   Pulse:   Temp: 101.5 F (38.6 C)  Resp:     Intake/Output Summary (Last 24 hours) at 12/10/14 1631 Last data filed at 12/10/14 1300  Gross per 24 hour  Intake 1364.67 ml  Output   1300 ml  Net  64.67 ml   Filed Weights   12/05/14 0500 12/08/14 0635 12/09/14 0500  Weight: 56.7 kg (125 lb) 56.7 kg (125 lb) 58.968 kg (130 lb)    Exam:   General:  *Appear in no acute distress, very pleasant , fully alert and appropriate  Cardiovascular: S1-S2 regular  Respiratory: Clear bilaterally  Abdomen: Soft nontender no organomegaly, +colostomy . +Wound vac  Musculoskeletal: No edema  , quadriplegia  Data Reviewed: Basic Metabolic Panel:  Recent Labs Lab 12/06/14 0535 12/08/14 0514 12/10/14 0435  NA 137 137 138  K 4.3 3.8 3.4*  CL 112 112 112  CO2 21 22 22   GLUCOSE 246* 86 127*  BUN 12 8 7   CREATININE 0.83 0.62 0.68  CALCIUM 9.4 9.2 9.1   Liver Function Tests: No results for input(s): AST, ALT, ALKPHOS, BILITOT, PROT, ALBUMIN in the last 168 hours. No results for input(s): LIPASE, AMYLASE in the last 168 hours. No results for input(s): AMMONIA in the last 168 hours. CBC:  Recent Labs Lab 12/08/14 0514 12/10/14 0435  WBC 13.5* 14.5*  HGB 7.3* 7.3*  HCT 22.8* 22.7*  MCV 84.1 83.5  PLT 416*  482*   Cardiac Enzymes: No results for input(s): CKTOTAL, CKMB, CKMBINDEX, TROPONINI in the last 168 hours. BNP (last 3 results) No results for input(s): BNP in the last 8760 hours.  ProBNP (last 3 results)  Recent Labs  01/29/14 2113  PROBNP 1343.0*    CBG:  Recent Labs Lab 12/09/14 1607 12/09/14 2140 12/10/14 0628 12/10/14 1142 12/10/14 1624  GLUCAP 165* 98 127* 174* 205*    Recent Results (from the past 240 hour(s))  Blood culture (routine x 2)     Status: None   Collection Time: 12/01/14  4:05 PM  Result Value Ref Range Status   Specimen Description BLOOD  LAC  Final   Special Requests BOTTLES DRAWN AEROBIC AND ANAEROBIC 5ML EACH  Final   Culture   Final    NO GROWTH 5 DAYS Performed at [facility]    Report Status 12/07/2014 FINAL  Final  Urine culture     Status: None   Collection Time: 12/02/14  2:25 AM  Result Value Ref Range Status   Specimen Description URINE, CATHETERIZED  Final   Special Requests NONE  Final   Colony Count NO GROWTH Performed at [facility]   Final   Culture NO GROWTH Performed at [facility]   Final   Report Status 12/03/2014 FINAL  Final  Surgical pcr screen     Status: None   Collection Time: 12/08/14  8:39 AM  Result Value Ref Range Status   MRSA, PCR NEGATIVE NEGATIVE Final   Staphylococcus aureus NEGATIVE  NEGATIVE Final    Comment:        The Xpert SA Assay (FDA approved for NASAL specimens in patients over 55 years of age), is one component of a comprehensive surveillance program.  Test performance has been validated by [facility] for patients greater than or equal to 65 year old. It is not intended to diagnose infection nor to guide or monitor treatment.      Studies: No results found.  Scheduled Meds: . antiseptic oral rinse  7 mL Mouth Rinse q12n4p  . atorvastatin  20 mg Oral Daily  . baclofen  10 mg Oral BID WC  . baclofen  20 mg Oral QHS  . chlorhexidine  15 mL Mouth  Rinse BID  . feeding supplement (GLUCERNA SHAKE)  237 mL Oral TID BM  . feeding supplement (PRO-STAT SUGAR FREE 64)  30 mL Oral BID  . fentaNYL  25 mcg Transdermal Q72H  . heparin  5,000 Units Subcutaneous 3 times per day  . insulin aspart  0-9 Units Subcutaneous TID WC  . insulin glargine  25 Units Subcutaneous QHS  . levofloxacin (LEVAQUIN) IV  500 mg Intravenous Q24H  . lidocaine  1 application Topical Once per day on Mon Wed Fri  . liver oil-zinc oxide   Topical BID  . metronidazole  500 mg Intravenous Q8H  . mirtazapine  15 mg Oral QHS  . mupirocin ointment  1 application Nasal Once per day on Mon Wed Fri  . pantoprazole  40 mg Oral Daily  . pregabalin  100 mg Oral TID AC  . pregabalin  200 mg Oral QHS  . QUEtiapine  25 mg Oral TID  . sodium chloride  3 mL Intravenous Q12H  . vancomycin  1,500 mg Intravenous Q24H   Continuous Infusions: . lactated ringers 10 mL/hr at 12/08/14 1532    Principal Problem:   Acute encephalopathy Active Problems:   Type 1 diabetes, uncontrolled, with neuropathy   Asthma   GERD   Gastroparesis due to DM   Protein-calorie malnutrition, severe   Spinal cord infarction (history of)   Tetraplegia   Neurogenic bladder   UTI (urinary tract infection)   Sacral pressure sore   Stage IV decubitus ulcer   Osteomyelitis, pelvis   Anemia   Burkholderia cepacia infection   Disorientation      Fatumata Kashani  [facility] Pager [phone]. If 7PM-7AM, please contact night-coverage at [email or web address], password [facility] 12/10/2014, 4:31 PM  LOS: 9 days

## 2014-12-10 NOTE — Consult Note (Signed)
Urology Consult  CC: Referring physician: Dr. Erlinda Hong Reason for referral: Leakage around suprapubic tube.   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 He underwent suprapubic tube placement by Dr. Matilde Sprang and last night there was some leakage around the suprapubic tube. It has been draining and occlusion of the suprapubic tube has not been a chronic problem in the past although he has not had the tube very long. It was last changed almost 1 month ago. There is not been any hematuria. The patient is unable to give any history and all history was obtained from his mother.  Past Medical History  Diagnosis Date  . GERD (gastroesophageal reflux disease)   . Asthma   . Hx MRSA infection     on face  . Gastroparesis   . Diabetic neuropathy   . Seizures   . Family history of anesthesia complication     Pt mother can't have epidural procedures  . Dysrhythmia   . Pneumonia   . Arthritis   . Fibromyalgia   . 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 Type I diabetes mellitus     sees Dr. Loanne Drilling    Past Surgical History  Procedure Laterality Date  . Tonsillectomy    . Multiple extractions with alveoloplasty N/A 08/03/2014    Procedure: MULTIPLE EXTRACTIONS;  Surgeon: Gae Bon, DDS;  Location: [place];  Service: Oral Surgery;  Laterality: N/A;  . Tee without cardioversion N/A 08/17/2014    Procedure: TRANSESOPHAGEAL ECHOCARDIOGRAM (TEE);  Surgeon: Dorothy Spark, MD;  Location: [facility] ENDOSCOPY;  Service: Cardiovascular;  Laterality: N/A;  . Debridment of decubitus ulcer N/A 10/04/2014    Procedure: DEBRIDMENT OF DECUBITUS ULCER;  Surgeon: Georganna Skeans, MD;  Location: [place];  Service: General;  Laterality: N/A;  . Laparoscopic diverted colostomy N/A 10/12/2014    Procedure: LAPAROSCOPIC DIVERTING COLOSTOMY;  Surgeon: Donnie Mesa, MD;  Location: [place];  Service: General;  Laterality: N/A;  . Insertion of suprapubic  catheter N/A 10/12/2014    Procedure: INSERTION OF SUPRAPUBIC CATHETER;  Surgeon: Reece Packer, MD;  Location: [place];  Service: Urology;  Laterality: N/A;  . Incision and drainage of wound N/A 11/12/2014    Procedure: IRRIGATION AND DEBRIDEMENT OF WOUNDS WITH BONE BIOPSY AND SURGICAL PREP ;  Surgeon: Theodoro Kos, DO;  Location: [place];  Service: Plastics;  Laterality: N/A;  . Application of a-cell of back N/A 11/12/2014    Procedure: APPLICATION A CELL AND VAC ;  Surgeon: Theodoro Kos, DO;  Location: [place];  Service: Plastics;  Laterality: N/A;  . Incision and drainage of wound N/A 11/18/2014    Procedure: IRRIGATION AND DEBRIDEMENT OF SACRAL ULCER ONLY WITH PLACEMENT OF A CELL AND VAC/ DRESSING CHANGE TO UPPER BACK AREA.;  Surgeon: Theodoro Kos, DO;  Location: [place];  Service: Plastics;  Laterality: N/A;  . Incision and drainage of wound N/A 11/25/2014    Procedure: IRRIGATION AND DEBRIDEMENT OF SACRAL ULCER AND BACK BURN WITH PLACEMENT OF A-CELL;  Surgeon: Theodoro Kos, DO;  Location: [place];  Service: Plastics;  Laterality: N/A;  . Minor application of wound vac N/A 11/25/2014    Procedure:  WOUND VAC CHANGE;  Surgeon: Theodoro Kos, DO;  Location: [place];  Service: Plastics;  Laterality: N/A;  . Incision and drainage of wound Right 12/08/2014    Procedure: IRRIGATION AND DEBRIDEMENT SACRAL WOUND AND RIGHT ISCHIAL WOUND ;  Surgeon: Theodoro Kos,  DO;  Location: [place];  Service: Plastics;  Laterality: Right;  . Application of a-cell of back N/A 12/08/2014    Procedure: APPLICATION OF A-CELL AND WOUND VAC ;  Surgeon: Theodoro Kos, DO;  Location: [place];  Service: Plastics;  Laterality: N/A;    Medications:  Prior to Admission:  Prescriptions prior to admission  Medication Sig Dispense Refill Last Dose  . acetaminophen (TYLENOL) 500 MG tablet Take 1,000 mg by mouth every 6 (six) hours as needed for headache.     . albuterol (PROVENTIL) (2.5 MG/3ML) 0.083% nebulizer solution Take 3 mLs (2.5 mg  total) by nebulization every 4 (four) hours as needed for wheezing or shortness of breath. 75 mL 3 Past Month at Unknown time  . diclofenac sodium (VOLTAREN) 1 % GEL Apply 2 g topically daily as needed (for pain). 100 g 0 Past Month at Unknown time  . diphenoxylate-atropine (LOMOTIL) 2.5-0.025 MG per tablet Take 1 tablet by mouth 4 (four) times daily as needed for diarrhea or loose stools.   12/01/2014  . fentaNYL (DURAGESIC - DOSED MCG/HR) 25 MCG/HR patch Place 1 patch (25 mcg total) onto the skin every 3 (three) days. 10 patch 0 12/01/2014 at Unknown time  . insulin aspart (NOVOLOG) 100 UNIT/ML injection Inject 4 Units into the skin 3 (three) times daily before meals. (Patient taking differently: Inject 5 Units into the skin 4 (four) times daily. ) 10 mL 0 12/01/2014 at Unknown time  . lidocaine (XYLOCAINE) 2 % jelly Apply 1 application topically 3 (three) times a week.   Past Month at Unknown time  . lidocaine (XYLOCAINE) 5 % ointment Apply 1 application topically as needed (mouth sores).   Past Month at Unknown time  . midodrine (PROAMATINE) 5 MG tablet Take 5 mg by mouth as needed (Systolic blood pressure <67).     . mupirocin ointment (BACTROBAN) 2 % Place 1 application into the nose 3 (three) times a week.   11/29/2014  . ondansetron (ZOFRAN) 4 MG tablet Take 4 mg by mouth every 8 (eight) hours as needed for nausea or vomiting.   Past Month at Unknown time  . Protein POWD Take 1 scoop by mouth 2 (two) times daily. Add whey powder to yogurt as a protein supplement.   12/01/2014 at Unknown time  . sodium chloride (OCEAN) 0.65 % SOLN nasal spray Place 1 spray into both nostrils as needed for congestion.   Past Month at Unknown time  . vancomycin (VANCOCIN) 1 GM/200ML SOLN Inject 200 mLs (1,000 mg total) into the vein daily. 7000 mL 0 12/01/2014 at Unknown time  . atorvastatin (LIPITOR) 20 MG tablet Take 20 mg by mouth daily.   11/30/2014  . baclofen (LIORESAL) 10 MG tablet Take 1-2 tablets (10-20 mg  total) by mouth 3 (three) times daily. Takes 10mg  in morning and lunchtime and takes 20mg  at bedtime 30 each 0 11/30/2014  . collagenase (SANTYL) ointment Apply 1 application topically daily. 90 g 5 11/29/2014  . dexlansoprazole (DEXILANT) 60 MG capsule Take 1 capsule (60 mg total) by mouth daily. 30 capsule 0 12/01/2014  . feeding supplement, GLUCERNA SHAKE, (GLUCERNA SHAKE) LIQD Take 237 mLs by mouth 3 (three) times daily between meals. (Patient taking differently: Take 237 mLs by mouth 3 (three) times daily with meals. ) 237 mL 90 11/30/2014  . glucagon (GLUCAGON EMERGENCY) 1 MG injection Inject 1 mg into the vein once as needed. (Patient taking differently: Inject 1 mg into the vein once as needed (hypoglycemia). )  1 each 12 Taking  . insulin glargine (LANTUS) 100 UNIT/ML injection Inject 0.25 mLs (25 Units total) into the skin at bedtime. 10 mL 0 11/30/2014  . loratadine (CLARITIN) 10 MG tablet Take 10 mg by mouth daily.    11/30/2014  . oxyCODONE-acetaminophen (PERCOCET) 10-325 MG per tablet Take 1 tablet by mouth every 8 (eight) hours. 90 tablet 0 11/30/2014  . potassium chloride SA (KLOR-CON M20) 20 MEQ tablet Take 1 tablet (20 mEq total) by mouth daily. 90 tablet 3 11/30/2014  . pregabalin (LYRICA) 100 MG capsule Take 1-2 capsules (100-200 mg total) by mouth 4 (four) times daily. Take 100 mg by mouth in the morning, take 100 mg by mouth in the afternoon, take 100 mg by mouth in the evening and take 200 mg by mouth at bedtime. 150 capsule 4 11/30/2014   Scheduled: . antiseptic oral rinse  7 mL Mouth Rinse q12n4p  . atorvastatin  20 mg Oral Daily  . baclofen  10 mg Oral BID WC  . baclofen  20 mg Oral QHS  . chlorhexidine  15 mL Mouth Rinse BID  . feeding supplement (GLUCERNA SHAKE)  237 mL Oral TID BM  . feeding supplement (PRO-STAT SUGAR FREE 64)  30 mL Oral BID  . fentaNYL  25 mcg Transdermal Q72H  . heparin  5,000 Units Subcutaneous 3 times per day  . insulin aspart  0-9 Units Subcutaneous TID  WC  . insulin glargine  25 Units Subcutaneous QHS  . levofloxacin (LEVAQUIN) IV  500 mg Intravenous Q24H  . lidocaine  1 application Topical Once per day on Mon Wed Fri  . liver oil-zinc oxide   Topical BID  . metronidazole  500 mg Intravenous Q8H  . mirtazapine  15 mg Oral QHS  . mupirocin ointment  1 application Nasal Once per day on Mon Wed Fri  . pantoprazole  40 mg Oral Daily  . pregabalin  100 mg Oral TID AC  . pregabalin  200 mg Oral QHS  . QUEtiapine  25 mg Oral TID  . sodium chloride  3 mL Intravenous Q12H  . vancomycin  1,500 mg Intravenous Q24H   Continuous: . lactated ringers 10 mL/hr at 12/08/14 1532    Allergies:  Allergies  Allergen Reactions  . Cefuroxime Axetil Anaphylaxis  . Ertapenem Other (See Comments)    Rash and confusion  . Morphine And Related Other (See Comments)    Changed mental status, confusion, headache, visual hallucination  . Penicillins Anaphylaxis and Other (See Comments)    ?can take amoxicillin?  . Sulfa Antibiotics Anaphylaxis, Shortness Of Breath and Other (See Comments)  . Tessalon [Benzonatate] Anaphylaxis  . Shellfish Allergy Itching and Other (See Comments)    Took benadryl to alleviate reaction    Family History  Problem Relation Age of Onset  . Diabetes Father   . Hypertension Father   . Asthma      fhx  . Hypertension      fhx  . Stroke      fhx  . Heart disease Mother     Social History:  reports that he quit smoking about 3 months ago. His smoking use included Cigars and Cigarettes. He quit after 12 years of use. He has never used smokeless tobacco. He reports that he does not drink alcohol or use illicit drugs.  Review of Systems: Pertinent items are noted in HPI. A comprehensive review of systems was unobtainable from the patient.  Physical Exam:  Vital signs in last  24 hours: Temp:  [98.2 F (36.8 C)-101.5 F (38.6 C)] 101.5 F (38.6 C) (03/04 1602) Pulse Rate:  [83-104] 104 (03/04 1648) Resp:  [16] 16  (03/04 1422) BP: (93-116)/(63-64) 116/64 mmHg (03/04 1422) SpO2:  [100 %] 100 % (03/04 1422) General appearance: Appears stated age Head: Normocephalic, without obvious abnormality, atraumatic Resp: normal respiratory effort Cardio: regular rate and rhythm GI: soft, slightly distended with a colostomy in the left lower quadrant and a suprapubic tube.  Male genitalia: penis: normal male phallus with no lesions or discharge.Testes: bilaterally descended with no masses or tenderness. no hernias  Extremities: extremities normal, atraumatic, no cyanosis or edema Skin: Skin color normal. No visible rashes or lesions   Laboratory Data:   Recent Labs  12/08/14 0514 12/10/14 0435  WBC 13.5* 14.5*  HGB 7.3* 7.3*  HCT 22.8* 22.7*   BMET  Recent Labs  12/08/14 0514 12/10/14 0435  NA 137 138  K 3.8 3.4*  CL 112 112  CO2 22 22  GLUCOSE 86 127*  BUN 8 7  CREATININE 0.62 0.68  CALCIUM 9.2 9.1   No results for input(s): LABPT, INR in the last 72 hours. No results for input(s): LABURIN in the last 72 hours. Results for orders placed or performed during the hospital encounter of 12/01/14  Blood culture (routine x 2)     Status: None   Collection Time: 12/01/14  4:05 PM  Result Value Ref Range Status   Specimen Description BLOOD  LAC  Final   Special Requests BOTTLES DRAWN AEROBIC AND ANAEROBIC 5ML EACH  Final   Culture   Final    NO GROWTH 5 DAYS Performed at [facility]    Report Status 12/07/2014 FINAL  Final  Urine culture     Status: None   Collection Time: 12/02/14  2:25 AM  Result Value Ref Range Status   Specimen Description URINE, CATHETERIZED  Final   Special Requests NONE  Final   Colony Count NO GROWTH Performed at [facility]   Final   Culture NO GROWTH Performed at [facility]   Final   Report Status 12/03/2014 FINAL  Final  Surgical pcr screen     Status: None   Collection Time: 12/08/14  8:39 AM  Result Value Ref Range Status    MRSA, PCR NEGATIVE NEGATIVE Final   Staphylococcus aureus NEGATIVE NEGATIVE Final    Comment:        The Xpert SA Assay (FDA approved for NASAL specimens in patients over 25 years of age), is one component of a comprehensive surveillance program.  Test performance has been validated by [facility] for patients greater than or equal to 39 year old. It is not intended to diagnose infection nor to guide or monitor treatment.    Creatinine:  Recent Labs  12/06/14 0535 12/08/14 0514 12/10/14 0435  CREATININE 0.83 0.62 0.68    Imaging: Dg Chest Port 1 View  12/10/2014   CLINICAL DATA:  Fevers, weakness  EXAM: PORTABLE CHEST - 1 VIEW  COMPARISON:  2/20 5/6 seen  FINDINGS: Cardiac shadow is prominent but accentuated by the portable technique. A right-sided PICC line is again seen in the superior right atrium. The lungs are well aerated bilaterally. Increased density is noted in the left retrocardiac region likely representing acute infiltrate.  IMPRESSION: Left retrocardiac infiltrate.   Electronically Signed   By: Inez Catalina M.D.   On: 12/10/2014 17:23    Impression/Assessment:  1. Leakage around suprapubic  tube - I had a long discussion with the patient's mother about suprapubic tube drainage and leaking. We discussed the fact that if she should see leakage occurring around the suprapubic tube she first would need to make sure that urine is draining from the tube and it is not kinked or occluded in some way. If that is the case then the catheter should be gently irrigated. We also discussed the formation of sediment that can cause occlusion of the suprapubic tube and the use of one half strength acetic acid solution intravesically and left indwelling for 30-60 minutes on a daily basis to help reduce formation of bladder sediment that could result in catheter occlusion. He has an 107 French suprapubic tube in. This should be adequate but may need to be sized if he should have difficulty  in the future with catheter occlusion. We also discussed the fact that if the catheter is not occluded and is draining properly but leakage is still occurring this is almost certainly secondary to bladder spasms. This can be controlled with anticholinergic or beta 3 agonist medication.  2. Fever - His urine from his suprapubic tube was completely clear and a urine culture done on 12/02/14 was negative. I did obtain a specimen from the suprapubic tube that can be sent for culture and sensitivity.  3. Suprapubic tube change - I performed the procedure of suprapubic tube change today by first prepping the suprapubic tube site with Betadine and removing his old suprapubic tube. A new suprapubic tube was then inserted. I used an 53 French Foley catheter. The balloon was blown up and the tube was connected to closed system drainage. This was well-tolerated with no complication.  Plan:  1. Catheterized specimen for culture and sensitivity. 2. I have placed in order for Toviaz 8 mg by mouth every 24 hours when necessary bladder spasm/suprapubic tube leakage. 2. He will follow-up with Dr. Matilde Sprang as an outpatient for his further urologic care.  Claybon Jabs 12/10/2014, 5:49 PM

## 2014-12-11 ENCOUNTER — Inpatient Hospital Stay (HOSPITAL_COMMUNITY): Payer: Medicaid Other

## 2014-12-11 LAB — CBC
HEMATOCRIT: 22.5 % — AB (ref 39.0–52.0)
Hemoglobin: 7.2 g/dL — ABNORMAL LOW (ref 13.0–17.0)
MCH: 26.7 pg (ref 26.0–34.0)
MCHC: 32 g/dL (ref 30.0–36.0)
MCV: 83.3 fL (ref 78.0–100.0)
PLATELETS: 487 10*3/uL — AB (ref 150–400)
RBC: 2.7 MIL/uL — ABNORMAL LOW (ref 4.22–5.81)
RDW: 17.2 % — AB (ref 11.5–15.5)
WBC: 16.6 10*3/uL — ABNORMAL HIGH (ref 4.0–10.5)

## 2014-12-11 LAB — BASIC METABOLIC PANEL
Anion gap: 1 — ABNORMAL LOW (ref 5–15)
BUN: 11 mg/dL (ref 6–23)
CALCIUM: 9.3 mg/dL (ref 8.4–10.5)
CHLORIDE: 114 mmol/L — AB (ref 96–112)
CO2: 25 mmol/L (ref 19–32)
Creatinine, Ser: 0.75 mg/dL (ref 0.50–1.35)
GFR calc Af Amer: >90 mL/min (ref >90)
GFR calc non Af Amer: >90 mL/min (ref >90)
Glucose, Bld: 73 mg/dL (ref 70–99)
POTASSIUM: 3.5 mmol/L (ref 3.5–5.1)
SODIUM: 140 mmol/L (ref 135–145)

## 2014-12-11 LAB — GLUCOSE, CAPILLARY
GLUCOSE-CAPILLARY: 233 mg/dL — AB (ref 70–99)
GLUCOSE-CAPILLARY: 190 mg/dL — AB (ref 70–99)
Glucose-Capillary: 117 mg/dL — ABNORMAL HIGH (ref 70–99)
Glucose-Capillary: 100 mg/dL — ABNORMAL HIGH (ref 70–99)

## 2014-12-11 MED ORDER — FLORANEX PO PACK
1.0000 g | PACK | Freq: Three times a day (TID) | ORAL | Status: DC
  Administered 2014-12-11 – 2014-12-18 (×15): 1 g via ORAL
  Filled 2014-12-11 (×23): qty 1

## 2014-12-11 MED ORDER — IPRATROPIUM-ALBUTEROL 0.5-2.5 (3) MG/3ML IN SOLN
3.0000 mL | Freq: Four times a day (QID) | RESPIRATORY_TRACT | Status: DC
  Administered 2014-12-11 – 2014-12-13 (×9): 3 mL via RESPIRATORY_TRACT
  Filled 2014-12-11 (×9): qty 3

## 2014-12-11 MED ORDER — FLUCONAZOLE IN SODIUM CHLORIDE 200-0.9 MG/100ML-% IV SOLN
200.0000 mg | Freq: Once | INTRAVENOUS | Status: AC
  Administered 2014-12-11: 200 mg via INTRAVENOUS
  Filled 2014-12-11: qty 100

## 2014-12-11 MED ORDER — GUAIFENESIN ER 600 MG PO TB12
600.0000 mg | ORAL_TABLET | Freq: Two times a day (BID) | ORAL | Status: DC
  Administered 2014-12-11 – 2014-12-18 (×12): 600 mg via ORAL
  Filled 2014-12-11 (×16): qty 1

## 2014-12-11 MED ORDER — INSULIN GLARGINE 100 UNIT/ML ~~LOC~~ SOLN
5.0000 [IU] | Freq: Every day | SUBCUTANEOUS | Status: DC
  Administered 2014-12-11: 5 [IU] via SUBCUTANEOUS
  Filled 2014-12-11 (×2): qty 0.05

## 2014-12-11 MED ORDER — ACETAMINOPHEN 500 MG PO TABS
500.0000 mg | ORAL_TABLET | Freq: Four times a day (QID) | ORAL | Status: DC | PRN
  Administered 2014-12-11 – 2014-12-23 (×7): 500 mg via ORAL
  Filled 2014-12-11 (×7): qty 1

## 2014-12-11 MED ORDER — IOHEXOL 300 MG/ML  SOLN
100.0000 mL | Freq: Once | INTRAMUSCULAR | Status: AC | PRN
  Administered 2014-12-11: 100 mL via INTRAVENOUS

## 2014-12-11 NOTE — Progress Notes (Addendum)
[facility] PROGRESS NOTE  Jason [name]:[id number] DOB: November 10, 1985 DOA: 12/01/2014 PCP: [name], MD  Assessment/Plan:   fever:  --patient was admitted with altered mental status, skin wound, was doing better on 3/4 was getting ready to be discharged home, however, spiking fever 3/4 pm, discharge cancelled, pan culture ordered. ID consulted. Ct chest/ab/pelvic pending.  --On 3/5, patient looks drowsy, no appetite, but aaox3, cooperative. No confusion, no hallucination. Awaiting for ct chest/ab/pel. Will check c diff. Pan culture pending, repeat ua unremarkable.   Altered mental status-  back to his baseline on 3/4, however, drowsy on 3/5 after fever , patient has complicated history with pneumonia ischial osteomyelitis, Klebsiella bacteremia and Burkhoderia UTI.  Dr. Michel Bickers recommends to stop meropenem and start Flagyl as well as Levaquin along with vancomycin.  Hallucinations - no hallucination since 3/3, continue Seroquel 25 mg 3 times a day per psych. Patient's mom requested to stop prn haldol and ativan. 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 It showed multiple food collections within the abductor magnus and abductor brevis muscles likely consistent with intramuscular abscesses. Patient was seen by plastic surgery underwent debridement on 11/12/2014 11/18/2014 and 11/25/2014 with VAC placement. Patient started on vancomycin and ertapenem as per infectious disease now the ertapenem was changed to meropenem. Blood culture urine culture has been collected. We'll follow the culture results, continue vancomycin, Flagyl, Levaquin. Will discontinue meropenem. Will start fentanyl patch 25 g every 72 hours. decubitus ulcer debrided again by plastic surgery on 3/2. Restart baclofen and lyrica on 3/3 for pain  control. Wound vac malfunction, plastic surgery aware on 3/5  Klebsiella pneumonia bacteremia- blood culture  was positive for Klebsiella pneumonia, currently patient on levaquin/flagyl/ vancomycin  Burkholderia UTI- patient has been treated for UTI. No need to treat with more antibiotics as per ID.  Type 1 diabetes mellitus- will change the Lantus to 25 units subcutaneous daily. Continue sliding scale insulin  Functional quadriplegia, neurogenic bladder- bedbound, secondary to spinal cord infarction. Reported leaking urine from superpubic catheter last night, no more urine leakage today on 3/4. Urology consulted, [name] switched suprapubic catheter, per patient mom, there were  Two liter of urine retained in the bladder that was drained at the time.  DVT prophylaxis- heparin  Code Status: Full code Family Communication: *Spoke to patient's mother/father at bedside Disposition Plan: Home with home health when stable   Consultants:  Psychiatry  Neurology  Plastic surgery   ID  urology  Procedures:   debridement of decubitus ulcer 3/2  Antibiotics:  Vancomycin/flagyl/levaquin  Meropenem stopped  HPI/Subjective: 29 y.o. 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 Patient was recently hospitalized from 2/1 to 2/12. He was treated for ischial osteomyelitis, pneumonia, Klebsiella bacteremia, and Burkhoderia UTI in hospital. Patient was discharged on IV vancomycin plus ertapenem, which are supposed to be continued for total of 6 weeks per ID. Patient has been doing okay until 3 days ago when he started hallucinating. It seems that he is on the phone talking to somebody who is not there. Mother reports pt has not been sleeping at all in the past 3 days. Mother states that  patient had hallucination 3 months ago, which was likely caused by morphine,  since his symptoms resolved after discontinuation of morphine. This time, mother discontinued patient's Percocet, Lyrica and baclofen without any help. His mother states that patient developed new rashes over his forehead and upper face. He has back pain. No headache, neck rigidity or photophobia. Patient denies fever, chills, cough, chest pain, SOB, nausea, vomiting, abdominal pain, diarrhea, dysuria, urgency, frequency, hematuria or leg swelling.  In ED, patient was found to have negative CT head for acute abnormalities. Leukocytosis with WBC 13.0, negative UDS, lactate 1.06, positive urinalysis for moderate leukocytes, negative salicylate and acetaminophen level, ammonia 17, temperature normal. Electrolytes okay. Patient's admitted to inpatient for further evaluation and treatment.  Interval history: Patient reported feeling weak and cold, no hallucination, no confusion, but drowsy. Wound vac detached from wound site this am, plastic surgery made aware.  Objective: Filed Vitals:   12/11/14 0500  BP: 107/67  Pulse: 92  Temp: 98.6 F (37 C)  Resp: 18    Intake/Output Summary (Last 24 hours) at 12/11/14 1405 Last data filed at 12/11/14 1400  Gross per 24 hour  Intake    410 ml  Output   5640 ml  Net  -5230 ml   Filed Weights   12/05/14 0500 12/08/14 0635 12/09/14 0500  Weight: 56.7 kg (125 lb) 56.7 kg (125 lb) 58.968 kg (130 lb)    Exam:   General:  drowsy  Cardiovascular: S1-S2 regular  Respiratory: Clear bilaterally  Abdomen: Soft nontender no organomegaly, +colostomy .+suprapubic catheter  Musculoskeletal: No edema  , quadriplegia  Skin: multiple wounds, detail  per wound care rn notes and plastic surgery note.  Data Reviewed: Basic Metabolic Panel:  Recent Labs Lab 12/06/14 0535 12/08/14 0514 12/10/14 0435 12/11/14 0845  NA 137 137 138 140  K 4.3 3.8 3.4* 3.5  CL 112 112 112 114*   CO2 21 22 22 25   GLUCOSE 246* 86 127* 73  BUN 12 8 7 11   CREATININE 0.83 0.62 0.68 0.75  CALCIUM 9.4 9.2 9.1 9.3   Liver Function Tests: No results for input(s): AST, ALT, ALKPHOS, BILITOT, PROT, ALBUMIN in the last 168 hours. No results for input(s): LIPASE, AMYLASE in the last 168 hours. No results for input(s): AMMONIA in the last 168 hours. CBC:  Recent Labs Lab 12/08/14 0514 12/10/14 0435 12/11/14 0845  WBC 13.5* 14.5* 16.6*  HGB 7.3* 7.3* 7.2*  HCT 22.8* 22.7* 22.5*  MCV 84.1 83.5 83.3  PLT 416* 482* 487*   Cardiac Enzymes: No results for input(s): CKTOTAL, CKMB, CKMBINDEX, TROPONINI in the last 168 hours. BNP (last 3 results) No results for input(s): BNP in the last 8760 hours.  ProBNP (last 3 results)  Recent Labs  01/29/14 2113  PROBNP 1343.0*    CBG:  Recent Labs Lab 12/10/14 1624 12/10/14 2118 12/10/14 2345 12/11/14 0621 12/11/14 1309  GLUCAP 205* 214* 227* 117* 100*    Recent Results (from the past 240 hour(s))  Blood culture (routine x 2)     Status: None   Collection Time: 12/01/14  4:05 PM  Result Value Ref Range Status   Specimen Description BLOOD  LAC  Final   Special Requests BOTTLES DRAWN AEROBIC AND ANAEROBIC 5ML EACH  Final   Culture   Final    NO GROWTH 5 DAYS Performed at [facility]    Report Status 12/07/2014 FINAL  Final  Urine culture     Status: None   Collection Time: 12/02/14  2:25 AM  Result Value Ref Range Status   Specimen  Description URINE, CATHETERIZED  Final   Special Requests NONE  Final   Colony Count NO GROWTH Performed at [facility]   Final   Culture NO GROWTH Performed at [facility]   Final   Report Status 12/03/2014 FINAL  Final  Surgical pcr screen     Status: None   Collection Time: 12/08/14  8:39 AM  Result Value Ref Range Status   MRSA, PCR NEGATIVE NEGATIVE Final   Staphylococcus aureus NEGATIVE NEGATIVE Final    Comment:        The Xpert SA Assay (FDA approved  for NASAL specimens in patients over 68 years of age), is one component of a comprehensive surveillance program.  Test performance has been validated by [facility] for patients greater than or equal to 68 year old. It is not intended to diagnose infection nor to guide or monitor treatment.   Culture, blood (routine x 2)     Status: None (Preliminary result)   Collection Time: 12/10/14  5:00 PM  Result Value Ref Range Status   Specimen Description BLOOD LEFT ARM  Final   Special Requests BOTTLES DRAWN AEROBIC ONLY 1 CC  Final   Culture PENDING  Incomplete   Report Status PENDING  Incomplete     Studies: Dg Chest Port 1 View  12/10/2014   CLINICAL DATA:  Fevers, weakness  EXAM: PORTABLE CHEST - 1 VIEW  COMPARISON:  2/20 5/6 seen  FINDINGS: Cardiac shadow is prominent but accentuated by the portable technique. A right-sided PICC line is again seen in the superior right atrium. The lungs are well aerated bilaterally. Increased density is noted in the left retrocardiac region likely representing acute infiltrate.  IMPRESSION: Left retrocardiac infiltrate.   Electronically Signed   By: Inez Catalina M.D.   On: 12/10/2014 17:23    Scheduled Meds: . antiseptic oral rinse  7 mL Mouth Rinse q12n4p  . atorvastatin  20 mg Oral Daily  . baclofen  10 mg Oral BID WC  . baclofen  20 mg Oral QHS  . chlorhexidine  15 mL Mouth Rinse BID  . feeding supplement (GLUCERNA SHAKE)  237 mL Oral TID BM  . feeding supplement (PRO-STAT SUGAR FREE 64)  30 mL Oral BID  . fentaNYL  25 mcg Transdermal Q72H  . heparin  5,000 Units Subcutaneous 3 times per day  . insulin aspart  0-9 Units Subcutaneous TID WC  . insulin glargine  25 Units Subcutaneous QHS  . levofloxacin (LEVAQUIN) IV  500 mg Intravenous Q24H  . lidocaine  1 application Topical Once per day on Mon Wed Fri  . liver oil-zinc oxide   Topical BID  . metronidazole  500 mg Intravenous Q8H  . mirtazapine  15 mg Oral QHS  . mupirocin ointment  1  application Nasal Once per day on Mon Wed Fri  . pantoprazole  40 mg Oral Daily  . pregabalin  100 mg Oral TID AC  . pregabalin  200 mg Oral QHS  . QUEtiapine  25 mg Oral TID  . sodium chloride  3 mL Intravenous Q12H  . vancomycin  1,500 mg Intravenous Q24H   Continuous Infusions: . lactated ringers 10 mL/hr at 12/11/14 0020    Principal Problem:   Acute encephalopathy Active Problems:   Type 1 diabetes, uncontrolled, with neuropathy   Asthma   GERD   Gastroparesis due to DM   Protein-calorie malnutrition, severe   Spinal cord infarction (history of)   Tetraplegia   Neurogenic  bladder   UTI (urinary tract infection)   Sacral pressure sore   Stage IV decubitus ulcer   Osteomyelitis, pelvis   Anemia   Burkholderia cepacia infection   Disorientation   PICC (peripherally inserted central catheter) in place      Jason [name]  [facility] Pager [phone]. If 7PM-7AM, please contact night-coverage at [email or web address], password [facility] 12/11/2014, 2:05 PM  LOS: 10 days     Addendum:  Will check c diff, though cdiff less likely. Urine +yeast, will add diflucan.

## 2014-12-11 NOTE — Progress Notes (Signed)
Attempted to page wound nurse x 2 with no response at this time regarding detached wound vac from both sacral pressure ulcer sites. Dr. Migdalia Dk to be paged at this time.

## 2014-12-11 NOTE — Progress Notes (Signed)
Upon examination patient was lethargic, BP 95/55, HR 105.  Patient was moaning with pain.  I called Resident on call and we discussed his situation, she suggested I call the Rapid RN for a second opinion, and she gave an order for 250 mL bolus of fluid.  Richardean Sale came and examined the patient and we will obtain vitals more frequent and monitor his status.

## 2014-12-11 NOTE — Progress Notes (Signed)
Dr. Migdalia Dk aware of pt's wound vac sponge completely detatched from wound sites. Dr. Migdalia Dk ordered to apply Hydrogel to both sacral pressure ulcers and cover with dry dressing. Orders received and carried out.

## 2014-12-12 DIAGNOSIS — A498 Other bacterial infections of unspecified site: Secondary | ICD-10-CM

## 2014-12-12 DIAGNOSIS — R6520 Severe sepsis without septic shock: Secondary | ICD-10-CM

## 2014-12-12 DIAGNOSIS — D6489 Other specified anemias: Secondary | ICD-10-CM

## 2014-12-12 DIAGNOSIS — A419 Sepsis, unspecified organism: Secondary | ICD-10-CM

## 2014-12-12 DIAGNOSIS — R509 Fever, unspecified: Secondary | ICD-10-CM | POA: Diagnosis present

## 2014-12-12 LAB — URINALYSIS, ROUTINE W REFLEX MICROSCOPIC
Bilirubin Urine: NEGATIVE
Bilirubin Urine: NEGATIVE
Glucose, UA: 250 mg/dL — AB
Glucose, UA: NEGATIVE mg/dL
KETONES UR: NEGATIVE mg/dL
Ketones, ur: NEGATIVE mg/dL
NITRITE: NEGATIVE
Nitrite: NEGATIVE
PH: 5 (ref 5.0–8.0)
PROTEIN: 30 mg/dL — AB
Protein, ur: 30 mg/dL — AB
Specific Gravity, Urine: 1.02 (ref 1.005–1.030)
Specific Gravity, Urine: 1.015 (ref 1.005–1.030)
Urobilinogen, UA: 0.2 mg/dL (ref 0.0–1.0)
Urobilinogen, UA: 0.2 mg/dL (ref 0.0–1.0)
pH: 5 (ref 5.0–8.0)

## 2014-12-12 LAB — POCT I-STAT 3, ART BLOOD GAS (G3+)
Acid-base deficit: 7 mmol/L — ABNORMAL HIGH (ref 0.0–2.0)
BICARBONATE: 18.1 meq/L — AB (ref 20.0–24.0)
O2 SAT: 87 %
Patient temperature: 100.4
TCO2: 19 mmol/L (ref 0–100)
pCO2 arterial: 36.3 mmHg (ref 35.0–45.0)
pH, Arterial: 7.31 — ABNORMAL LOW (ref 7.350–7.450)
pO2, Arterial: 60 mmHg — ABNORMAL LOW (ref 80.0–100.0)

## 2014-12-12 LAB — CBC
HCT: 22.2 % — ABNORMAL LOW (ref 39.0–52.0)
Hemoglobin: 7.2 g/dL — ABNORMAL LOW (ref 13.0–17.0)
MCH: 27 pg (ref 26.0–34.0)
MCHC: 32.4 g/dL (ref 30.0–36.0)
MCV: 83.1 fL (ref 78.0–100.0)
Platelets: 492 10*3/uL — ABNORMAL HIGH (ref 150–400)
RBC: 2.67 MIL/uL — ABNORMAL LOW (ref 4.22–5.81)
RDW: 17.4 % — ABNORMAL HIGH (ref 11.5–15.5)
WBC: 30.5 10*3/uL — ABNORMAL HIGH (ref 4.0–10.5)

## 2014-12-12 LAB — HEPATIC FUNCTION PANEL
ALBUMIN: 2.1 g/dL — AB (ref 3.5–5.2)
ALT: 13 U/L (ref 0–53)
AST: 16 U/L (ref 0–37)
Alkaline Phosphatase: 83 U/L (ref 39–117)
Bilirubin, Direct: <0.1 mg/dL (ref 0.0–0.5)
Total Bilirubin: 0.7 mg/dL (ref 0.3–1.2)
Total Protein: 6.4 g/dL (ref 6.0–8.3)

## 2014-12-12 LAB — BASIC METABOLIC PANEL
Anion gap: 5 (ref 5–15)
BUN: 12 mg/dL (ref 6–23)
CO2: 22 mmol/L (ref 19–32)
Calcium: 9.3 mg/dL (ref 8.4–10.5)
Chloride: 109 mmol/L (ref 96–112)
Creatinine, Ser: 0.85 mg/dL (ref 0.50–1.35)
GFR calc Af Amer: >90 mL/min (ref >90)
GFR calc non Af Amer: >90 mL/min (ref >90)
Glucose, Bld: 307 mg/dL — ABNORMAL HIGH (ref 70–99)
Potassium: 3.7 mmol/L (ref 3.5–5.1)
Sodium: 136 mmol/L (ref 135–145)

## 2014-12-12 LAB — GLUCOSE, CAPILLARY
GLUCOSE-CAPILLARY: 188 mg/dL — AB (ref 70–99)
Glucose-Capillary: 291 mg/dL — ABNORMAL HIGH (ref 70–99)
Glucose-Capillary: 202 mg/dL — ABNORMAL HIGH (ref 70–99)

## 2014-12-12 LAB — MRSA PCR SCREENING: MRSA by PCR: NEGATIVE

## 2014-12-12 LAB — URINE CULTURE
Colony Count: NO GROWTH
Culture: NO GROWTH

## 2014-12-12 LAB — URINE MICROSCOPIC-ADD ON

## 2014-12-12 LAB — VANCOMYCIN, TROUGH: Vancomycin Tr: 19.6 ug/mL (ref 10.0–20.0)

## 2014-12-12 LAB — LACTIC ACID, PLASMA: Lactic Acid, Venous: 0.9 mmol/L (ref 0.5–2.0)

## 2014-12-12 LAB — CLOSTRIDIUM DIFFICILE BY PCR: CDIFFPCR: NEGATIVE

## 2014-12-12 MED ORDER — SODIUM CHLORIDE 0.9 % IV BOLUS (SEPSIS)
500.0000 mL | Freq: Once | INTRAVENOUS | Status: AC
  Administered 2014-12-12: 500 mL via INTRAVENOUS

## 2014-12-12 MED ORDER — INSULIN GLARGINE 100 UNIT/ML ~~LOC~~ SOLN
10.0000 [IU] | Freq: Every day | SUBCUTANEOUS | Status: DC
  Administered 2014-12-12: 10 [IU] via SUBCUTANEOUS
  Filled 2014-12-12 (×2): qty 0.1

## 2014-12-12 MED ORDER — DEXTROSE 5 % IV SOLN
1.0000 g | Freq: Three times a day (TID) | INTRAVENOUS | Status: DC
  Administered 2014-12-12 – 2014-12-17 (×16): 1 g via INTRAVENOUS
  Filled 2014-12-12 (×20): qty 1

## 2014-12-12 NOTE — Progress Notes (Signed)
Patient being transferred to ICU. Report called to [facility] in ICU.

## 2014-12-12 NOTE — Progress Notes (Signed)
[facility] for Infectious Disease  Date of Admission:  12/01/2014  Antibiotics: Vancomycin, levaquin, flagyl  Subjective: AMS  Objective: Temp:  [97.7 F (36.5 C)-102 F (38.9 C)] 98.6 F (37 C) (03/06 0604) Pulse Rate:  [99-129] 123 (03/06 0604) Resp:  [18-20] 18 (03/06 0604) BP: (102-126)/(55-67) 115/64 mmHg (03/06 0604) SpO2:  [98 %-100 %] 100 % (03/06 0604)  General: sleeping, minimally arousable Skin: no rashes Lungs: diffuse rhonchi Cor: tachy RR Abdomen: soft, nt, nd   Lab Results Lab Results  Component Value Date   WBC 30.5* 12/12/2014   HGB 7.2* 12/12/2014   HCT 22.2* 12/12/2014   MCV 83.1 12/12/2014   PLT 492* 12/12/2014    Lab Results  Component Value Date   CREATININE 0.85 12/12/2014   BUN 12 12/12/2014   NA 136 12/12/2014   K 3.7 12/12/2014   CL 109 12/12/2014   CO2 22 12/12/2014    Lab Results  Component Value Date   ALT 13 12/12/2014   AST 16 12/12/2014   ALKPHOS 83 12/12/2014   BILITOT 0.7 12/12/2014      Microbiology: Recent Results (from the past 240 hour(s))  Surgical pcr screen     Status: None   Collection Time: 12/08/14  8:39 AM  Result Value Ref Range Status   MRSA, PCR NEGATIVE NEGATIVE Final   Staphylococcus aureus NEGATIVE NEGATIVE Final    Comment:        The Xpert SA Assay (FDA approved for NASAL specimens in patients over 64 years of age), is one component of a comprehensive surveillance program.  Test performance has been validated by [facility] for patients greater than or equal to 34 year old. It is not intended to diagnose infection nor to guide or monitor treatment.   Culture, blood (routine x 2)     Status: None (Preliminary result)   Collection Time: 12/10/14  4:55 PM  Result Value Ref Range Status   Specimen Description Blood  Final   Special Requests NONE  Final   Culture   Final           BLOOD CULTURE RECEIVED NO GROWTH TO DATE CULTURE WILL BE HELD FOR 5 DAYS BEFORE ISSUING A FINAL NEGATIVE  REPORT Performed at [facility]    Report Status PENDING  Incomplete  Culture, blood (routine x 2)     Status: None (Preliminary result)   Collection Time: 12/10/14  5:00 PM  Result Value Ref Range Status   Specimen Description BLOOD LEFT ARM  Final   Special Requests BOTTLES DRAWN AEROBIC ONLY 1 CC  Final   Culture   Final           BLOOD CULTURE RECEIVED NO GROWTH TO DATE CULTURE WILL BE HELD FOR 5 DAYS BEFORE ISSUING A FINAL NEGATIVE REPORT Performed at [facility]    Report Status PENDING  Incomplete  Urine culture     Status: None   Collection Time: 12/10/14  6:11 PM  Result Value Ref Range Status   Specimen Description URINE, CATHETERIZED  Final   Special Requests NONE  Final   Colony Count NO GROWTH Performed at [facility]   Final   Culture NO GROWTH Performed at [facility]   Final   Report Status 12/12/2014 FINAL  Final  Clostridium Difficile by PCR     Status: None   Collection Time: 12/12/14  4:22 AM  Result Value Ref Range Status   C difficile by pcr  NEGATIVE NEGATIVE Final    Studies/Results: Ct Chest W Contrast  12/11/2014   CLINICAL DATA:  Initial evaluation for fever, patient is paraplegic due to spinal cord infarct  EXAM: CT CHEST, ABDOMEN, AND PELVIS WITH CONTRAST  TECHNIQUE: Multidetector CT imaging of the chest, abdomen and pelvis was performed following the standard protocol during bolus administration of intravenous contrast.  CONTRAST:  171mL OMNIPAQUE IOHEXOL 300 MG/ML  SOLN  COMPARISON:  Multiple prior studies including 11/15/2014 CT thorax  FINDINGS: CT CHEST FINDINGS  Right lower lobe fairly extensive consolidation with air bronchograms. In the left lower lobe, consolidation is again identified, showing moderate improvement when compared to the prior study. 11 mm triangular right upper lobe opacity series 3, image number 11 and 12, stable from 11/15/2014. Faint patchy right upper lobe infiltrate new from prior study.   Residual thymic tissue anterior mediastinum. Numerous likely reactive mediastinal lymph nodes, similar to prior study. Patulous fluid-filled esophagus noted.  Small right pleural effusion. Tiny left pleural effusions similar to prior study.  CT ABDOMEN AND PELVIS FINDINGS  Significant gastric distention with air and fluid. Liver and gallbladder are normal. Spleen is normal. Pancreas is normal.  Adrenal glands are normal. No evidence of hydronephrosis on the left side, although the urothelium of the renal pelvis and ureter enhances mildly. Mild perinephric inflammatory change on the left. On the right, there is evidence of medial lower pole scarring. Mild urothelial enhancement of the renal pelvis and ureter identified on the right as well.  Numerous phleboliths along the course of the distal ureters. There is a suprapubic catheter in the bladder. There is bladder wall thickening.  Left lower quadrant ostomy noted. Nonobstructive bowel gas pattern. Distal colon decompressed.  Decubitus ulcer identified over the inferior sacrum and coccyx. Also evidence of decubitus ulcer with gas within it over the right posterior ischium with evidence of osteomyelitis involving the ischium, consisting of cortical destruction periosteal reaction. No definite osteomyelitis involving the inferior sacrum and coccyx although ulcer extends deep down nearly to the level of bone.  IMPRESSION: 1. Improving left lower lobe pneumonia 2. Pneumonia right lower lobe worse. New faint right upper lobe infiltrate consistent with pneumonia as well. 3. 11 mm nodular opacity right upper lobe. Follow-up chest CT in 3-6 months recommended. This recommendation follows the consensus statement: Guidelines for Management of Small Pulmonary Nodules Detected on CT Scans: A Statement from the [place] as published in Radiology 2005; 237:395-400. 4. Distended stomach similar to prior studies likely due to gastroparesis. 5. Suprapubic catheter with  bladder wall thickening. Enhancement of bilateral urothelium. Cannot exclude possibility of ascending infection causing pyelonephritis. 6. Decubitus ulcers as described above. Appearance consistent with osteomyelitis right ischium.   Electronically Signed   By: Skipper Cliche M.D.   On: 12/11/2014 16:54   Ct Abdomen Pelvis W Contrast  12/11/2014   CLINICAL DATA:  Initial evaluation for fever, patient is paraplegic due to spinal cord infarct  EXAM: CT CHEST, ABDOMEN, AND PELVIS WITH CONTRAST  TECHNIQUE: Multidetector CT imaging of the chest, abdomen and pelvis was performed following the standard protocol during bolus administration of intravenous contrast.  CONTRAST:  165mL OMNIPAQUE IOHEXOL 300 MG/ML  SOLN  COMPARISON:  Multiple prior studies including 11/15/2014 CT thorax  FINDINGS: CT CHEST FINDINGS  Right lower lobe fairly extensive consolidation with air bronchograms. In the left lower lobe, consolidation is again identified, showing moderate improvement when compared to the prior study. 11 mm triangular right upper lobe opacity series 3, image  number 11 and 12, stable from 11/15/2014. Faint patchy right upper lobe infiltrate new from prior study.  Residual thymic tissue anterior mediastinum. Numerous likely reactive mediastinal lymph nodes, similar to prior study. Patulous fluid-filled esophagus noted.  Small right pleural effusion. Tiny left pleural effusions similar to prior study.  CT ABDOMEN AND PELVIS FINDINGS  Significant gastric distention with air and fluid. Liver and gallbladder are normal. Spleen is normal. Pancreas is normal.  Adrenal glands are normal. No evidence of hydronephrosis on the left side, although the urothelium of the renal pelvis and ureter enhances mildly. Mild perinephric inflammatory change on the left. On the right, there is evidence of medial lower pole scarring. Mild urothelial enhancement of the renal pelvis and ureter identified on the right as well.  Numerous phleboliths  along the course of the distal ureters. There is a suprapubic catheter in the bladder. There is bladder wall thickening.  Left lower quadrant ostomy noted. Nonobstructive bowel gas pattern. Distal colon decompressed.  Decubitus ulcer identified over the inferior sacrum and coccyx. Also evidence of decubitus ulcer with gas within it over the right posterior ischium with evidence of osteomyelitis involving the ischium, consisting of cortical destruction periosteal reaction. No definite osteomyelitis involving the inferior sacrum and coccyx although ulcer extends deep down nearly to the level of bone.  IMPRESSION: 1. Improving left lower lobe pneumonia 2. Pneumonia right lower lobe worse. New faint right upper lobe infiltrate consistent with pneumonia as well. 3. 11 mm nodular opacity right upper lobe. Follow-up chest CT in 3-6 months recommended. This recommendation follows the consensus statement: Guidelines for Management of Small Pulmonary Nodules Detected on CT Scans: A Statement from the [place] as published in Radiology 2005; 237:395-400. 4. Distended stomach similar to prior studies likely due to gastroparesis. 5. Suprapubic catheter with bladder wall thickening. Enhancement of bilateral urothelium. Cannot exclude possibility of ascending infection causing pyelonephritis. 6. Decubitus ulcers as described above. Appearance consistent with osteomyelitis right ischium.   Electronically Signed   By: Skipper Cliche M.D.   On: 12/11/2014 16:54   Dg Chest Port 1 View  12/10/2014   CLINICAL DATA:  Fevers, weakness  EXAM: PORTABLE CHEST - 1 VIEW  COMPARISON:  2/20 5/6 seen  FINDINGS: Cardiac shadow is prominent but accentuated by the portable technique. A right-sided PICC line is again seen in the superior right atrium. The lungs are well aerated bilaterally. Increased density is noted in the left retrocardiac region likely representing acute infiltrate.  IMPRESSION: Left retrocardiac infiltrate.    Electronically Signed   By: Inez Catalina M.D.   On: 12/10/2014 17:23    Assessment/Plan:  1) Fever, elevated WBC - repeat UA, change antibiotics to aztreonam.  Consider meropenem again if no improvement.  Also discussed with CCM.  To go to ICU.  Possible HCAP vs urinary source.  Increased opacity on CT, possible pyelo on Ct.    Scharlene Gloss, [place] [place] for Infectious Disease [place] [email or web address] [phone] pager   [phone] cell 12/12/2014, 2:47 PM

## 2014-12-12 NOTE — Progress Notes (Signed)
OT Cancellation Note  Patient Details Name: Jason [name] MRN: [id number] DOB: 09-02-1986   Cancelled Treatment:    Reason Eval/Treat Not Completed: Medical issues which prohibited therapy. Pt currently moving to stepdown unit and is NPO. OT will follow up to address goals of self-feeding as appropriate. Note that pt has an NPO order and SLP eval for swallowing.   [name]   Cyndie Chime, OTR/L Occupational Therapist [phone] (pager)  12/12/2014, 2:57 PM

## 2014-12-12 NOTE — Progress Notes (Signed)
CCM called to question if fent patch on right should still needed in current pt condition, MD wanted to keep patch at this time, also ABG results given pt was on RA at this time with sats reading 94-100%, After ABG collected pt was placed on 4 L nasal cannula stats still remains 100%, will cont to monitor and assess pt. CCM MD to room to discuss ABG results with MOM whom was at the bedside

## 2014-12-12 NOTE — Progress Notes (Signed)
[facility] PROGRESS NOTE  Jason [name]:[id number] DOB: 06/28/1986 DOA: 12/01/2014 PCP: Jason Morale, MD  Assessment/Plan:   fever:  --patient was admitted on 2/24 with altered mental status, hallucination,decubitus ulcers, was doing better on 3/4 was getting ready to be discharged home, however, spiking fever 3/4 pm, discharge cancelled, pan culture ordered. ID consulted. --Ct chest/ab/pelvic persistent lower lobe low infiltrates/pyelo? --On 3/5, patient looks drowsy, no appetite, but aaox3, cooperative. No confusion, no hallucination. Repeat c diff negative. Pan culture pending, repeat ua ? Yeast? Diflucan started on 3/5.  --3/6 worsening of mental status, very lethargic. Worsening of leukocytosis. Keep spiking fever. Repeat cdiff negative, repeat blood culture no growth. Pan ct as above, Discussed with ID, will changed abx to aztreonam. Will keep npo, will likely need ng tube if not improving. Induced sputum culture ordered.  Will likely need to transfer to stepdown, PCCM consulted as well due to deterioration. Had lengthy discussion with patient's mom who is here 24/7 in the hospital. Mom agrees with all above.   Altered mental status-   --admitted with hallucination, patient has complicated history with pneumonia ischial osteomyelitis, Klebsiella bacteremia and Burkhoderia UTI.  ID recommended on admission to stop meropenem and start Flagyl as well as Levaquin along with vancomycin. --back to his baseline on 3/4,  --however, drowsy on 3/5 after fever ,  --very lethargic on 3/6  Hallucinations -  --no hallucination since 3/3, continue Seroquel 25 mg 3 times a day per psych. Patient's mom requested to stop prn haldol and ativan. Patient's mom reported good response from seroquel and remeron --lethargic from 3/5 after spiking fever, made npo on 3/6 due to worsening of lethargy, see above.   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 It showed multiple food collections within the abductor magnus and abductor brevis muscles likely consistent with intramuscular abscesses. Patient was seen by plastic surgery underwent debridement on 11/12/2014 11/18/2014 and 11/25/2014 with VAC placement. Patient started on vancomycin and ertapenem as per infectious disease then changed to meropenem, then changed to vancomycin, Flagyl, Levaquin due to hallucination.  - decubitus ulcer debrided again by plastic surgery on 3/2.  -Wound vac malfunction/detatched on 3/5, made plastic surgery aware on 3/5  Klebsiella pneumonia bacteremia-  --blood culture  was positive for Klebsiella pneumonia during last admission,  --on levaquin/flagyl/ vancomycin this admission before 3/6 --on aztreonam/vanc since 3/6 due to fever, worsening leukocytosis  Burkholderia UTI- during previous admission, finished treatment per ID  Type 1 diabetes mellitus- need to close adjust insulin, fluctuating mental status, inconsistent oral intake.  Functional quadriplegia, neurogenic bladder-  --bedbound, secondary to spinal cord infarction.  -Reported leaking urine from superpubic catheter 3/3-3/4nightt, - Urology consulted 3/4, Dr. Loel [name] switched suprapubic catheter, per patient mom, there were  Two liter of urine retained in the bladder that was drained at the time.  DVT prophylaxis- heparin  Code Status: Full code Family Communication: *Spoke to patient's mother at bedside Disposition Plan: discharge cancelled due to deterioration, likely need stepdown/icu   Consultants:  Psychiatry, signed off  Neurology, signed off  Plastic surgery   ID  Urology  PCCM  Procedures:   debridement of decubitus ulcer 3/2  Antibiotics:  Vancomycin/flagyl/levaquin from admission to 3/6  Vanc/aztreonam from 3/6   HPI/Subjective: 29 y.o. 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 Patient was recently hospitalized from 2/1 to 2/12. He was treated for ischial osteomyelitis, pneumonia, Klebsiella bacteremia, and Burkhoderia UTI in hospital. Patient was discharged on IV vancomycin plus ertapenem, which are supposed to be continued for total of 6 weeks per ID. Patient has been doing okay until 3 days ago when he started hallucinating. It seems that he is on the phone talking to somebody who is not there. Mother reports pt has not been sleeping at all in the past 3 days. Mother states that patient had hallucination 3 months ago, which was likely caused by morphine, since his symptoms resolved after discontinuation of morphine. This time, mother discontinued patient's Percocet, Lyrica and baclofen without any help. His mother states that patient developed new rashes over his forehead and upper face. He has back pain. No headache, neck rigidity or photophobia. Patient denies fever, chills, cough, chest pain, SOB, nausea, vomiting, abdominal pain, diarrhea, dysuria, urgency, frequency, hematuria or leg swelling.  In ED, patient was found to have negative CT head for acute abnormalities. Leukocytosis with WBC 13.0, negative UDS, lactate 1.06, positive urinalysis for moderate leukocytes, negative salicylate and acetaminophen level, ammonia 17, temperature normal. Electrolytes okay. Patient's admitted to inpatient for further evaluation and treatment.  Interval history: Very lethargic, continue spiking fever, worsening of leukocytosis Objective: Filed Vitals:   12/12/14 0604  BP: 115/64  Pulse: 123  Temp: 98.6 F (37 C)  Resp: 18    Intake/Output Summary (Last 24 hours) at 12/12/14 1106 Last data filed at 12/12/14 0412  Gross per 24 hour  Intake   1040 ml  Output   3350 ml  Net  -2310 ml   Filed Weights    12/05/14 0500 12/08/14 0635 12/09/14 0500  Weight: 56.7 kg (125 lb) 56.7 kg (125 lb) 58.968 kg (130 lb)    Exam:   General:  drowsy  Cardiovascular: S1-S2 regular  Respiratory: diminished at bases  Abdomen: Soft nontender no organomegaly, +colostomy .+suprapubic catheter  Musculoskeletal: No edema  , quadriplegia  Skin: multiple wounds, detail  per wound care rn notes and plastic surgery note.  Data Reviewed: Basic Metabolic Panel:  Recent Labs Lab 12/06/14 0535 12/08/14 0514 12/10/14 0435 12/11/14 0845 12/12/14 0458  NA 137 137 138 140 136  K 4.3 3.8 3.4* 3.5 3.7  CL 112 112 112 114* 109  CO2 21 22 22 25 22   GLUCOSE 246* 86 127* 73 307*  BUN 12 8 7 11 12   CREATININE 0.83 0.62 0.68 0.75 0.85  CALCIUM 9.4 9.2 9.1 9.3 9.3   Liver Function Tests:  Recent Labs Lab 12/12/14 0458  AST 16  ALT 13  ALKPHOS 83  BILITOT 0.7  PROT 6.4  ALBUMIN 2.1*   No results for input(s): LIPASE, AMYLASE in the last 168 hours. No results for input(s): AMMONIA in the last 168 hours. CBC:  Recent Labs Lab 12/08/14 0514 12/10/14 0435 12/11/14 0845 12/12/14 0458  WBC 13.5* 14.5* 16.6* 30.5*  HGB 7.3* 7.3* 7.2* 7.2*  HCT 22.8* 22.7* 22.5* 22.2*  MCV 84.1 83.5 83.3 83.1  PLT 416* 482* 487* 492*   Cardiac Enzymes: No results for input(s): CKTOTAL, CKMB, CKMBINDEX, TROPONINI in the last 168 hours. BNP (last 3 results) No results for input(s): BNP in the last 8760 hours.  ProBNP (last 3 results)  Recent Labs  01/29/14 2113  PROBNP 1343.0*    CBG:  Recent Labs Lab 12/11/14 0621 12/11/14 1309 12/11/14 1659 12/11/14 2204 12/12/14  0629  GLUCAP 117* 100* 233* 190* 291*    Recent Results (from the past 240 hour(s))  Surgical pcr screen     Status: None   Collection Time: 12/08/14  8:39 AM  Result Value Ref Range Status   MRSA, PCR NEGATIVE NEGATIVE Final   Staphylococcus aureus NEGATIVE NEGATIVE Final    Comment:        The Xpert SA Assay (FDA approved for  NASAL specimens in patients over 13 years of age), is one component of a comprehensive surveillance program.  Test performance has been validated by [facility] for patients greater than or equal to 19 year old. It is not intended to diagnose infection nor to guide or monitor treatment.   Culture, blood (routine x 2)     Status: None (Preliminary result)   Collection Time: 12/10/14  4:55 PM  Result Value Ref Range Status   Specimen Description Blood  Final   Special Requests NONE  Final   Culture   Final           BLOOD CULTURE RECEIVED NO GROWTH TO DATE CULTURE WILL BE HELD FOR 5 DAYS BEFORE ISSUING A FINAL NEGATIVE REPORT Performed at [facility]    Report Status PENDING  Incomplete  Culture, blood (routine x 2)     Status: None (Preliminary result)   Collection Time: 12/10/14  5:00 PM  Result Value Ref Range Status   Specimen Description BLOOD LEFT ARM  Final   Special Requests BOTTLES DRAWN AEROBIC ONLY 1 CC  Final   Culture   Final           BLOOD CULTURE RECEIVED NO GROWTH TO DATE CULTURE WILL BE HELD FOR 5 DAYS BEFORE ISSUING A FINAL NEGATIVE REPORT Performed at [facility]    Report Status PENDING  Incomplete  Urine culture     Status: None   Collection Time: 12/10/14  6:11 PM  Result Value Ref Range Status   Specimen Description URINE, CATHETERIZED  Final   Special Requests NONE  Final   Colony Count NO GROWTH Performed at [facility]   Final   Culture NO GROWTH Performed at [facility]   Final   Report Status 12/12/2014 FINAL  Final  Clostridium Difficile by PCR     Status: None   Collection Time: 12/12/14  4:22 AM  Result Value Ref Range Status   C difficile by pcr NEGATIVE NEGATIVE Final     Studies: Ct Chest W Contrast  12/11/2014   CLINICAL DATA:  Initial evaluation for fever, patient is paraplegic due to spinal cord infarct  EXAM: CT CHEST, ABDOMEN, AND PELVIS WITH CONTRAST  TECHNIQUE: Multidetector CT imaging of the  chest, abdomen and pelvis was performed following the standard protocol during bolus administration of intravenous contrast.  CONTRAST:  178mL OMNIPAQUE IOHEXOL 300 MG/ML  SOLN  COMPARISON:  Multiple prior studies including 11/15/2014 CT thorax  FINDINGS: CT CHEST FINDINGS  Right lower lobe fairly extensive consolidation with air bronchograms. In the left lower lobe, consolidation is again identified, showing moderate improvement when compared to the prior study. 11 mm triangular right upper lobe opacity series 3, image number 11 and 12, stable from 11/15/2014. Faint patchy right upper lobe infiltrate new from prior study.  Residual thymic tissue anterior mediastinum. Numerous likely reactive mediastinal lymph nodes, similar to prior study. Patulous fluid-filled esophagus noted.  Small right pleural effusion. Tiny left pleural effusions similar to prior study.  CT ABDOMEN AND PELVIS  FINDINGS  Significant gastric distention with air and fluid. Liver and gallbladder are normal. Spleen is normal. Pancreas is normal.  Adrenal glands are normal. No evidence of hydronephrosis on the left side, although the urothelium of the renal pelvis and ureter enhances mildly. Mild perinephric inflammatory change on the left. On the right, there is evidence of medial lower pole scarring. Mild urothelial enhancement of the renal pelvis and ureter identified on the right as well.  Numerous phleboliths along the course of the distal ureters. There is a suprapubic catheter in the bladder. There is bladder wall thickening.  Left lower quadrant ostomy noted. Nonobstructive bowel gas pattern. Distal colon decompressed.  Decubitus ulcer identified over the inferior sacrum and coccyx. Also evidence of decubitus ulcer with gas within it over the right posterior ischium with evidence of osteomyelitis involving the ischium, consisting of cortical destruction periosteal reaction. No definite osteomyelitis involving the inferior sacrum and coccyx  although ulcer extends deep down nearly to the level of bone.  IMPRESSION: 1. Improving left lower lobe pneumonia 2. Pneumonia right lower lobe worse. New faint right upper lobe infiltrate consistent with pneumonia as well. 3. 11 mm nodular opacity right upper lobe. Follow-up chest CT in 3-6 months recommended. This recommendation follows the consensus statement: Guidelines for Management of Small Pulmonary Nodules Detected on CT Scans: A Statement from the [place] as published in Radiology 2005; 237:395-400. 4. Distended stomach similar to prior studies likely due to gastroparesis. 5. Suprapubic catheter with bladder wall thickening. Enhancement of bilateral urothelium. Cannot exclude possibility of ascending infection causing pyelonephritis. 6. Decubitus ulcers as described above. Appearance consistent with osteomyelitis right ischium.   Electronically Signed   By: Skipper Cliche M.D.   On: 12/11/2014 16:54   Ct Abdomen Pelvis W Contrast  12/11/2014   CLINICAL DATA:  Initial evaluation for fever, patient is paraplegic due to spinal cord infarct  EXAM: CT CHEST, ABDOMEN, AND PELVIS WITH CONTRAST  TECHNIQUE: Multidetector CT imaging of the chest, abdomen and pelvis was performed following the standard protocol during bolus administration of intravenous contrast.  CONTRAST:  167mL OMNIPAQUE IOHEXOL 300 MG/ML  SOLN  COMPARISON:  Multiple prior studies including 11/15/2014 CT thorax  FINDINGS: CT CHEST FINDINGS  Right lower lobe fairly extensive consolidation with air bronchograms. In the left lower lobe, consolidation is again identified, showing moderate improvement when compared to the prior study. 11 mm triangular right upper lobe opacity series 3, image number 11 and 12, stable from 11/15/2014. Faint patchy right upper lobe infiltrate new from prior study.  Residual thymic tissue anterior mediastinum. Numerous likely reactive mediastinal lymph nodes, similar to prior study. Patulous fluid-filled  esophagus noted.  Small right pleural effusion. Tiny left pleural effusions similar to prior study.  CT ABDOMEN AND PELVIS FINDINGS  Significant gastric distention with air and fluid. Liver and gallbladder are normal. Spleen is normal. Pancreas is normal.  Adrenal glands are normal. No evidence of hydronephrosis on the left side, although the urothelium of the renal pelvis and ureter enhances mildly. Mild perinephric inflammatory change on the left. On the right, there is evidence of medial lower pole scarring. Mild urothelial enhancement of the renal pelvis and ureter identified on the right as well.  Numerous phleboliths along the course of the distal ureters. There is a suprapubic catheter in the bladder. There is bladder wall thickening.  Left lower quadrant ostomy noted. Nonobstructive bowel gas pattern. Distal colon decompressed.  Decubitus ulcer identified over the inferior sacrum and coccyx. Also evidence  of decubitus ulcer with gas within it over the right posterior ischium with evidence of osteomyelitis involving the ischium, consisting of cortical destruction periosteal reaction. No definite osteomyelitis involving the inferior sacrum and coccyx although ulcer extends deep down nearly to the level of bone.  IMPRESSION: 1. Improving left lower lobe pneumonia 2. Pneumonia right lower lobe worse. New faint right upper lobe infiltrate consistent with pneumonia as well. 3. 11 mm nodular opacity right upper lobe. Follow-up chest CT in 3-6 months recommended. This recommendation follows the consensus statement: Guidelines for Management of Small Pulmonary Nodules Detected on CT Scans: A Statement from the [place] as published in Radiology 2005; 237:395-400. 4. Distended stomach similar to prior studies likely due to gastroparesis. 5. Suprapubic catheter with bladder wall thickening. Enhancement of bilateral urothelium. Cannot exclude possibility of ascending infection causing pyelonephritis. 6.  Decubitus ulcers as described above. Appearance consistent with osteomyelitis right ischium.   Electronically Signed   By: Skipper Cliche M.D.   On: 12/11/2014 16:54   Dg Chest Port 1 View  12/10/2014   CLINICAL DATA:  Fevers, weakness  EXAM: PORTABLE CHEST - 1 VIEW  COMPARISON:  2/20 5/6 seen  FINDINGS: Cardiac shadow is prominent but accentuated by the portable technique. A right-sided PICC line is again seen in the superior right atrium. The lungs are well aerated bilaterally. Increased density is noted in the left retrocardiac region likely representing acute infiltrate.  IMPRESSION: Left retrocardiac infiltrate.   Electronically Signed   By: Inez Catalina M.D.   On: 12/10/2014 17:23    Scheduled Meds: . antiseptic oral rinse  7 mL Mouth Rinse q12n4p  . atorvastatin  20 mg Oral Daily  . baclofen  10 mg Oral BID WC  . baclofen  20 mg Oral QHS  . chlorhexidine  15 mL Mouth Rinse BID  . feeding supplement (GLUCERNA SHAKE)  237 mL Oral TID BM  . feeding supplement (PRO-STAT SUGAR FREE 64)  30 mL Oral BID  . fentaNYL  25 mcg Transdermal Q72H  . guaiFENesin  600 mg Oral BID  . heparin  5,000 Units Subcutaneous 3 times per day  . insulin aspart  0-9 Units Subcutaneous TID WC  . insulin glargine  10 Units Subcutaneous QHS  . ipratropium-albuterol  3 mL Nebulization Q6H  . lactobacillus  1 g Oral TID WC  . levofloxacin (LEVAQUIN) IV  500 mg Intravenous Q24H  . lidocaine  1 application Topical Once per day on Mon Wed Fri  . liver oil-zinc oxide   Topical BID  . metronidazole  500 mg Intravenous Q8H  . mirtazapine  15 mg Oral QHS  . mupirocin ointment  1 application Nasal Once per day on Mon Wed Fri  . pantoprazole  40 mg Oral Daily  . pregabalin  100 mg Oral TID AC  . pregabalin  200 mg Oral QHS  . QUEtiapine  25 mg Oral TID  . sodium chloride  3 mL Intravenous Q12H  . vancomycin  1,500 mg Intravenous Q24H   Continuous Infusions: . lactated ringers 20 mL/hr at 12/11/14 1849     Principal Problem:   Acute encephalopathy Active Problems:   Type 1 diabetes, uncontrolled, with neuropathy   Asthma   GERD   Gastroparesis due to DM   Protein-calorie malnutrition, severe   Spinal cord infarction (history of)   Tetraplegia   Neurogenic bladder   UTI (urinary tract infection)   Sacral pressure sore   Stage IV decubitus ulcer   Osteomyelitis,  pelvis   Anemia   Burkholderia cepacia infection   Disorientation   PICC (peripherally inserted central catheter) in place   Time >1hr, including coordinating care and talk to family.   Jennifermarie Franzen  [facility] Pager [phone]. If 7PM-7AM, please contact night-coverage at [email or web address], password [facility] 12/12/2014, 11:06 AM  LOS: 11 days

## 2014-12-12 NOTE — Progress Notes (Signed)
ANTIBIOTIC CONSULT NOTE - FOLLOW UP  Pharmacy Consult for vancomycin, azactam Indication: sepsis/HCAP  Allergies  Allergen Reactions  . Cefuroxime Axetil Anaphylaxis  . Ertapenem Other (See Comments)    Rash and confusion  . Morphine And Related Other (See Comments)    Changed mental status, confusion, headache, visual hallucination  . Penicillins Anaphylaxis and Other (See Comments)    ?can take amoxicillin?  . Sulfa Antibiotics Anaphylaxis, Shortness Of Breath and Other (See Comments)  . Tessalon [Benzonatate] Anaphylaxis  . Shellfish Allergy Itching and Other (See Comments)    Took benadryl to alleviate reaction    Patient Measurements: Height: 5\' 8"  (172.7 cm) (from 11/08/14) Weight: 130 lb (58.968 kg) IBW/kg (Calculated) : 68.4  Vital Signs: Temp: 99.9 F (37.7 C) (03/06 1447) Temp Source: Oral (03/06 1447) BP: 108/71 mmHg (03/06 1700) Pulse Rate: 114 (03/06 1700) Intake/Output from previous day: 03/05 0701 - 03/06 0700 In: 1140 [I.V.:240; IV Piggyback:900] Out: 4600 [Urine:3500; Drains:100; Stool:1000] Intake/Output from this shift: Total I/O In: 60.5 [I.V.:10.5; IV Piggyback:50] Out: 725 [Urine:225; Stool:500]  Labs:  Recent Labs  12/10/14 0435 12/11/14 0845 12/12/14 0458  WBC 14.5* 16.6* 30.5*  HGB 7.3* 7.2* 7.2*  PLT 482* 487* 492*  CREATININE 0.68 0.75 0.85   Estimated Creatinine Clearance: 106 mL/min (by C-G formula based on Cr of 0.85).  Recent Labs  12/12/14 1400  VANCOTROUGH 19.6     Microbiology: Recent Results (from the past 720 hour(s))  Culture, blood (routine x 2)     Status: None   Collection Time: 11/12/14  8:04 PM  Result Value Ref Range Status   Specimen Description BLOOD LEFT ARM  Final   Special Requests BOTTLES DRAWN AEROBIC AND ANAEROBIC 10CC  Final   Culture   Final    NO GROWTH 5 DAYS Note: Culture results may be compromised due to an excessive volume of blood received in culture bottles. Performed at [facility]    Report Status 11/19/2014 FINAL  Final  Culture, blood (routine x 2)     Status: None   Collection Time: 11/12/14  8:19 PM  Result Value Ref Range Status   Specimen Description BLOOD LEFT ARM  Final   Special Requests   Final    BOTTLES DRAWN AEROBIC AND ANAEROBIC BLUE 10CC RED 5CC   Culture   Final    NO GROWTH 5 DAYS Performed at [facility]    Report Status 11/19/2014 FINAL  Final  Body fluid culture     Status: None   Collection Time: 11/15/14 11:03 AM  Result Value Ref Range Status   Specimen Description FLUID HIP RIGHT SYNOVIAL  Final   Special Requests NONE  Final   Gram Stain   Final    RARE WBC PRESENT, PREDOMINANTLY MONONUCLEAR NO ORGANISMS SEEN Performed at [facility]    Culture   Final    NO GROWTH 3 DAYS Performed at [facility]    Report Status 11/19/2014 FINAL  Final  Blood Cultures x 2 sites     Status: None   Collection Time: 11/16/14  7:00 PM  Result Value Ref Range Status   Specimen Description BLOOD RIGHT ARM  Final   Special Requests BOTTLES DRAWN AEROBIC ONLY 10CC  Final   Culture   Final    NO GROWTH 5 DAYS Performed at [facility]    Report Status 11/23/2014 FINAL  Final  Blood Cultures x 2 sites     Status: None  Collection Time: 11/16/14  7:12 PM  Result Value Ref Range Status   Specimen Description BLOOD LEFT HAND  Final   Special Requests BOTTLES DRAWN AEROBIC ONLY 1CC  Final   Culture   Final    NO GROWTH 5 DAYS Performed at [facility]    Report Status 11/23/2014 FINAL  Final  Blood culture (routine x 2)     Status: None   Collection Time: 12/01/14  4:05 PM  Result Value Ref Range Status   Specimen Description BLOOD  LAC  Final   Special Requests BOTTLES DRAWN AEROBIC AND ANAEROBIC 5ML EACH  Final   Culture   Final    NO GROWTH 5 DAYS Performed at [facility]    Report Status 12/07/2014 FINAL  Final  Urine culture     Status: None   Collection Time: 12/02/14   2:25 AM  Result Value Ref Range Status   Specimen Description URINE, CATHETERIZED  Final   Special Requests NONE  Final   Colony Count NO GROWTH Performed at [facility]   Final   Culture NO GROWTH Performed at [facility]   Final   Report Status 12/03/2014 FINAL  Final  Surgical pcr screen     Status: None   Collection Time: 12/08/14  8:39 AM  Result Value Ref Range Status   MRSA, PCR NEGATIVE NEGATIVE Final   Staphylococcus aureus NEGATIVE NEGATIVE Final    Comment:        The Xpert SA Assay (FDA approved for NASAL specimens in patients over 61 years of age), is one component of a comprehensive surveillance program.  Test performance has been validated by [facility] for patients greater than or equal to 62 year old. It is not intended to diagnose infection nor to guide or monitor treatment.   Culture, blood (routine x 2)     Status: None (Preliminary result)   Collection Time: 12/10/14  4:55 PM  Result Value Ref Range Status   Specimen Description Blood  Final   Special Requests NONE  Final   Culture   Final           BLOOD CULTURE RECEIVED NO GROWTH TO DATE CULTURE WILL BE HELD FOR 5 DAYS BEFORE ISSUING A FINAL NEGATIVE REPORT Performed at [facility]    Report Status PENDING  Incomplete  Culture, blood (routine x 2)     Status: None (Preliminary result)   Collection Time: 12/10/14  5:00 PM  Result Value Ref Range Status   Specimen Description BLOOD LEFT ARM  Final   Special Requests BOTTLES DRAWN AEROBIC ONLY 1 CC  Final   Culture   Final           BLOOD CULTURE RECEIVED NO GROWTH TO DATE CULTURE WILL BE HELD FOR 5 DAYS BEFORE ISSUING A FINAL NEGATIVE REPORT Performed at [facility]    Report Status PENDING  Incomplete  Urine culture     Status: None   Collection Time: 12/10/14  6:11 PM  Result Value Ref Range Status   Specimen Description URINE, CATHETERIZED  Final   Special Requests NONE  Final   Colony Count NO  GROWTH Performed at [facility]   Final   Culture NO GROWTH Performed at [facility]   Final   Report Status 12/12/2014 FINAL  Final  Clostridium Difficile by PCR     Status: None   Collection Time: 12/12/14  4:22 AM  Result Value Ref  Range Status   C difficile by pcr NEGATIVE NEGATIVE Final    Anti-infectives    Start     Dose/Rate Route Frequency Ordered Stop   12/12/14 1400  aztreonam (AZACTAM) 1 g in dextrose 5 % 50 mL IVPB     1 g 100 mL/hr over 30 Minutes Intravenous 3 times per day 12/12/14 1258     12/11/14 1830  fluconazole (DIFLUCAN) IVPB 200 mg     200 mg 100 mL/hr over 60 Minutes Intravenous  Once 12/11/14 1747 12/11/14 1949   12/10/14 0000  levofloxacin (LEVAQUIN) 500 MG/100ML SOLN  Status:  Discontinued     over 60 Minutes   12/10/14 1403 12/10/14    12/10/14 0000  metroNIDAZOLE (FLAGYL) 5-0.79 MG/ML-% IVPB  Status:  Discontinued     100 mL/hr over 60 Minutes   12/10/14 1403 12/10/14    12/10/14 0000  vancomycin 1,500 mg in sodium chloride 0.9 % 500 mL     1,500 mg 250 mL/hr over 120 Minutes Intravenous Every 24 hours 12/10/14 1403 12/31/14 2359   12/10/14 0000  levofloxacin (LEVAQUIN) 500 MG/100ML SOLN  Status:  Discontinued     over 60 Minutes   12/10/14 1545 12/10/14    12/10/14 0000  levofloxacin (LEVAQUIN) 500 MG/100ML SOLN     500 mg 100 mL/hr over 60 Minutes Intravenous Every 24 hours 12/10/14 1654     12/10/14 0000  metroNIDAZOLE (FLAGYL) 5-0.79 MG/ML-% IVPB     500 mg 100 mL/hr over 60 Minutes Intravenous Every 8 hours 12/10/14 1654     12/09/14 1600  metroNIDAZOLE (FLAGYL) IVPB 500 mg  Status:  Discontinued     500 mg 100 mL/hr over 60 Minutes Intravenous Every 8 hours 12/09/14 1415 12/12/14 1118   12/08/14 1627  polymyxin B 500,000 Units, bacitracin 50,000 Units in sodium chloride irrigation 0.9 % 500 mL irrigation  Status:  Discontinued       As needed 12/08/14 1628 12/08/14 1702   12/08/14 1430  vancomycin (VANCOCIN) 1,500 mg in  sodium chloride 0.9 % 500 mL IVPB  Status:  Discontinued     1,500 mg 250 mL/hr over 120 Minutes Intravenous Every 24 hours 12/07/14 1405 12/07/14 1406   12/08/14 0600  ciprofloxacin (CIPRO) IVPB 400 mg  Status:  Discontinued     400 mg 200 mL/hr over 60 Minutes Intravenous On call to O.R. 12/07/14 1256 12/07/14 1259   12/07/14 1430  vancomycin (VANCOCIN) 1,500 mg in sodium chloride 0.9 % 500 mL IVPB     1,500 mg 250 mL/hr over 120 Minutes Intravenous Every 24 hours 12/07/14 1406     12/04/14 1300  levofloxacin (LEVAQUIN) IVPB 500 mg  Status:  Discontinued     500 mg 100 mL/hr over 60 Minutes Intravenous Every 24 hours 12/04/14 1108 12/12/14 1118   12/04/14 1200  metroNIDAZOLE (FLAGYL) IVPB 500 mg  Status:  Discontinued     500 mg 100 mL/hr over 60 Minutes Intravenous Every 8 hours 12/04/14 1108 12/09/14 1415   12/03/14 0800  vancomycin (VANCOCIN) 1,250 mg in sodium chloride 0.9 % 250 mL IVPB  Status:  Discontinued     1,250 mg 166.7 mL/hr over 90 Minutes Intravenous Every 24 hours 12/02/14 1158 12/07/14 1405   12/02/14 1130  vancomycin (VANCOCIN) IVPB 1000 mg/200 mL premix  Status:  Discontinued     1,000 mg 200 mL/hr over 60 Minutes Intravenous Every 24 hours 12/02/14 0119 12/02/14 1155   12/02/14 0300  meropenem (MERREM) 1 g in sodium  chloride 0.9 % 100 mL IVPB  Status:  Discontinued     1 g 200 mL/hr over 30 Minutes Intravenous 3 times per day 12/02/14 3833 12/04/14 1108      Assessment: 29 yo male with fever and concern for sepsis from pyelonephritis vs HCAP on vancomycin and levaquin.  He he noted with recent fever and WBC= 30.5.  ID is also following. Pharmacy has been consulted for change to azactam. SCr= 0.85 and CrCl ~ 100.  Vancomycin trough is at goal (VT= 19.6) on vancomycin 1500mg  IV q24h.   Vanc PTA >> (initally planning 6 weeks from 2/5) LVQ 2/27 >> 3/6 Flagyl 2/27 >> 3/6 Merrem 2/25 >> 2/27 Aztreonam 3/6 >> Fluc x 1 3/5  2/25 UCx - neg FINAL 2/24 BCx x2 - neg  FINAL 3/4 BCx x 2 ngtd 3/4 urine neg 3/6 cdiff neg  Goal of Therapy:  Vancomycin trough level 15-20 mcg/ml  Plan:  -Azactam 1gm IV q8h -No vancomycin dose changes needed -Will follow renal function and preogress  Hildred Laser, Pharm D 12/12/2014 5:31 PM
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
## 2014-12-12 NOTE — Progress Notes (Addendum)
SLP Cancellation Note  Patient Details Name: Jason [name] MRN: [id number] DOB: 24-Feb-1986   Cancelled treatment:       Reason Eval/Treat Not Completed: Other (comment);Medical issues which prohibited therapy. Pt being transferred to ICU, per MD note tachypneic with high O2 needs. Will f/u when pt is more stable.    Adine Heimann, Katherene Ponto 12/12/2014, 3:10 PM

## 2014-12-13 DIAGNOSIS — E1041 Type 1 diabetes mellitus with diabetic mononeuropathy: Secondary | ICD-10-CM

## 2014-12-13 DIAGNOSIS — K210 Gastro-esophageal reflux disease with esophagitis: Secondary | ICD-10-CM

## 2014-12-13 DIAGNOSIS — R410 Disorientation, unspecified: Secondary | ICD-10-CM

## 2014-12-13 LAB — CBC
HCT: 22.2 % — ABNORMAL LOW (ref 39.0–52.0)
HEMOGLOBIN: 7.1 g/dL — AB (ref 13.0–17.0)
MCH: 26.7 pg (ref 26.0–34.0)
MCHC: 32 g/dL (ref 30.0–36.0)
MCV: 83.5 fL (ref 78.0–100.0)
PLATELETS: 527 10*3/uL — AB (ref 150–400)
RBC: 2.66 MIL/uL — ABNORMAL LOW (ref 4.22–5.81)
RDW: 17.8 % — ABNORMAL HIGH (ref 11.5–15.5)
WBC: 32 10*3/uL — ABNORMAL HIGH (ref 4.0–10.5)

## 2014-12-13 LAB — DIFFERENTIAL
Basophils Absolute: 0.1 10*3/uL (ref 0.0–0.1)
Basophils Relative: 0 % (ref 0–1)
Eosinophils Absolute: 0.1 10*3/uL (ref 0.0–0.7)
Eosinophils Relative: 0 % (ref 0–5)
Lymphocytes Relative: 8 % — ABNORMAL LOW (ref 12–46)
Lymphs Abs: 2 10*3/uL (ref 0.7–4.0)
Monocytes Absolute: 3 10*3/uL — ABNORMAL HIGH (ref 0.1–1.0)
Monocytes Relative: 11 % (ref 3–12)
Neutro Abs: 21.7 10*3/uL — ABNORMAL HIGH (ref 1.7–7.7)
Neutrophils Relative %: 81 % — ABNORMAL HIGH (ref 43–77)

## 2014-12-13 LAB — BASIC METABOLIC PANEL
Anion gap: 9 (ref 5–15)
BUN: 15 mg/dL (ref 6–23)
CO2: 16 mmol/L — ABNORMAL LOW (ref 19–32)
CREATININE: 1.08 mg/dL (ref 0.50–1.35)
Calcium: 9.6 mg/dL (ref 8.4–10.5)
Chloride: 112 mmol/L (ref 96–112)
Glucose, Bld: 358 mg/dL — ABNORMAL HIGH (ref 70–99)
Potassium: 3.7 mmol/L (ref 3.5–5.1)
Sodium: 137 mmol/L (ref 135–145)

## 2014-12-13 LAB — GLUCOSE, CAPILLARY
GLUCOSE-CAPILLARY: 362 mg/dL — AB (ref 70–99)
GLUCOSE-CAPILLARY: 362 mg/dL — AB (ref 70–99)
Glucose-Capillary: 433 mg/dL — ABNORMAL HIGH (ref 70–99)
Glucose-Capillary: 443 mg/dL — ABNORMAL HIGH (ref 70–99)
Glucose-Capillary: 443 mg/dL — ABNORMAL HIGH (ref 70–99)

## 2014-12-13 MED ORDER — METRONIDAZOLE IN NACL 5-0.79 MG/ML-% IV SOLN
500.0000 mg | Freq: Three times a day (TID) | INTRAVENOUS | Status: DC
  Administered 2014-12-13 – 2014-12-24 (×34): 500 mg via INTRAVENOUS
  Filled 2014-12-13 (×41): qty 100

## 2014-12-13 MED ORDER — IPRATROPIUM BROMIDE HFA 17 MCG/ACT IN AERS: 2.0000 | INHALATION_SPRAY | Freq: Four times a day (QID) | RESPIRATORY_TRACT | Status: DC

## 2014-12-13 MED ORDER — INSULIN GLARGINE 100 UNIT/ML ~~LOC~~ SOLN
18.0000 [IU] | Freq: Every day | SUBCUTANEOUS | Status: DC
  Administered 2014-12-14: 18 [IU] via SUBCUTANEOUS
  Filled 2014-12-13 (×3): qty 0.18

## 2014-12-13 MED ORDER — IPRATROPIUM BROMIDE 0.02 % IN SOLN
0.5000 mg | Freq: Four times a day (QID) | RESPIRATORY_TRACT | Status: DC
  Administered 2014-12-13 – 2014-12-18 (×19): 0.5 mg via RESPIRATORY_TRACT
  Filled 2014-12-13 (×19): qty 2.5

## 2014-12-13 MED ORDER — LEVALBUTEROL HCL 1.25 MG/0.5ML IN NEBU
1.2500 mg | INHALATION_SOLUTION | Freq: Four times a day (QID) | RESPIRATORY_TRACT | Status: DC
  Administered 2014-12-13 – 2014-12-18 (×19): 1.25 mg via RESPIRATORY_TRACT
  Filled 2014-12-13 (×23): qty 0.5

## 2014-12-13 MED ORDER — FENTANYL 25 MCG/HR TD PT72
25.0000 ug | MEDICATED_PATCH | TRANSDERMAL | Status: DC
  Administered 2014-12-13 – 2014-12-22 (×4): 25 ug via TRANSDERMAL
  Filled 2014-12-13 (×4): qty 1
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
## 2014-12-13 NOTE — Progress Notes (Signed)
[facility] for Infectious Disease  Date of Admission:  12/01/2014  Antibiotics: Vancomycin, day #2 of aztreonam,  flagyl  Subjective: Remains febrile up to 100.9 last night, thus has had fevers for the past 3 days. This afternoon he is more appropriate. Awake, watching TV. He states that after he cleared phlegm this morning, he is feeling better. No cough. He underwent bedside dressing change by RN today.   Objective: Temp:  [98.1 F (36.7 C)-100.9 F (38.3 C)] 100.8 F (38.2 C) (03/07 0738) Pulse Rate:  [105-127] 127 (03/07 0900) Resp:  [13-28] 26 (03/07 0900) BP: (94-125)/(55-75) 100/60 mmHg (03/07 0900) SpO2:  [94 %-100 %] 100 % (03/07 0900)  General: awake, watching tv Skin: no rashes Lungs: diffuse rhonchi Cor: tachy RR Abdomen: soft, nt, nd Ext: wound on left leg, and hip covered   Lab Results Lab Results  Component Value Date   WBC 32.0* 12/13/2014   HGB 7.1* 12/13/2014   HCT 22.2* 12/13/2014   MCV 83.5 12/13/2014   PLT 527* 12/13/2014    Lab Results  Component Value Date   CREATININE 1.08 12/13/2014   BUN 15 12/13/2014   NA 137 12/13/2014   K 3.7 12/13/2014   CL 112 12/13/2014   CO2 16* 12/13/2014    Lab Results  Component Value Date   ALT 13 12/12/2014   AST 16 12/12/2014   ALKPHOS 83 12/12/2014   BILITOT 0.7 12/12/2014      Microbiology: Recent Results (from the past 240 hour(s))  Surgical pcr screen     Status: None   Collection Time: 12/08/14  8:39 AM  Result Value Ref Range Status   MRSA, PCR NEGATIVE NEGATIVE Final   Staphylococcus aureus NEGATIVE NEGATIVE Final    Comment:        The Xpert SA Assay (FDA approved for NASAL specimens in patients over 38 years of age), is one component of a comprehensive surveillance program.  Test performance has been validated by [facility] for patients greater than or equal to 17 year old. It is not intended to diagnose infection nor to guide or monitor treatment.   Culture, blood  (routine x 2)     Status: None (Preliminary result)   Collection Time: 12/10/14  4:55 PM  Result Value Ref Range Status   Specimen Description Blood  Final   Special Requests NONE  Final   Culture   Final           BLOOD CULTURE RECEIVED NO GROWTH TO DATE CULTURE WILL BE HELD FOR 5 DAYS BEFORE ISSUING A FINAL NEGATIVE REPORT Performed at [facility]    Report Status PENDING  Incomplete  Culture, blood (routine x 2)     Status: None (Preliminary result)   Collection Time: 12/10/14  5:00 PM  Result Value Ref Range Status   Specimen Description BLOOD LEFT ARM  Final   Special Requests BOTTLES DRAWN AEROBIC ONLY 1 CC  Final   Culture   Final           BLOOD CULTURE RECEIVED NO GROWTH TO DATE CULTURE WILL BE HELD FOR 5 DAYS BEFORE ISSUING A FINAL NEGATIVE REPORT Performed at [facility]    Report Status PENDING  Incomplete  Urine culture     Status: None   Collection Time: 12/10/14  6:11 PM  Result Value Ref Range Status   Specimen Description URINE, CATHETERIZED  Final   Special Requests NONE  Final   Colony Count NO GROWTH  Performed at [facility]   Final   Culture NO GROWTH Performed at [facility]   Final   Report Status 12/12/2014 FINAL  Final  Clostridium Difficile by PCR     Status: None   Collection Time: 12/12/14  4:22 AM  Result Value Ref Range Status   C difficile by pcr NEGATIVE NEGATIVE Final  MRSA PCR Screening     Status: None   Collection Time: 12/12/14  3:27 PM  Result Value Ref Range Status   MRSA by PCR NEGATIVE NEGATIVE Final    Comment:        The GeneXpert MRSA Assay (FDA approved for NASAL specimens only), is one component of a comprehensive MRSA colonization surveillance program. It is not intended to diagnose MRSA infection nor to guide or monitor treatment for MRSA infections.     Studies/Results: Ct Chest W Contrast  12/11/2014   CLINICAL DATA:  Initial evaluation for fever, patient is paraplegic due to  spinal cord infarct  EXAM: CT CHEST, ABDOMEN, AND PELVIS WITH CONTRAST  TECHNIQUE: Multidetector CT imaging of the chest, abdomen and pelvis was performed following the standard protocol during bolus administration of intravenous contrast.  CONTRAST:  138mL OMNIPAQUE IOHEXOL 300 MG/ML  SOLN  COMPARISON:  Multiple prior studies including 11/15/2014 CT thorax  FINDINGS: CT CHEST FINDINGS  Right lower lobe fairly extensive consolidation with air bronchograms. In the left lower lobe, consolidation is again identified, showing moderate improvement when compared to the prior study. 11 mm triangular right upper lobe opacity series 3, image number 11 and 12, stable from 11/15/2014. Faint patchy right upper lobe infiltrate new from prior study.  Residual thymic tissue anterior mediastinum. Numerous likely reactive mediastinal lymph nodes, similar to prior study. Patulous fluid-filled esophagus noted.  Small right pleural effusion. Tiny left pleural effusions similar to prior study.  CT ABDOMEN AND PELVIS FINDINGS  Significant gastric distention with air and fluid. Liver and gallbladder are normal. Spleen is normal. Pancreas is normal.  Adrenal glands are normal. No evidence of hydronephrosis on the left side, although the urothelium of the renal pelvis and ureter enhances mildly. Mild perinephric inflammatory change on the left. On the right, there is evidence of medial lower pole scarring. Mild urothelial enhancement of the renal pelvis and ureter identified on the right as well.  Numerous phleboliths along the course of the distal ureters. There is a suprapubic catheter in the bladder. There is bladder wall thickening.  Left lower quadrant ostomy noted. Nonobstructive bowel gas pattern. Distal colon decompressed.  Decubitus ulcer identified over the inferior sacrum and coccyx. Also evidence of decubitus ulcer with gas within it over the right posterior ischium with evidence of osteomyelitis involving the ischium,  consisting of cortical destruction periosteal reaction. No definite osteomyelitis involving the inferior sacrum and coccyx although ulcer extends deep down nearly to the level of bone.  IMPRESSION: 1. Improving left lower lobe pneumonia 2. Pneumonia right lower lobe worse. New faint right upper lobe infiltrate consistent with pneumonia as well. 3. 11 mm nodular opacity right upper lobe. Follow-up chest CT in 3-6 months recommended. This recommendation follows the consensus statement: Guidelines for Management of Small Pulmonary Nodules Detected on CT Scans: A Statement from the [place] as published in Radiology 2005; 237:395-400. 4. Distended stomach similar to prior studies likely due to gastroparesis. 5. Suprapubic catheter with bladder wall thickening. Enhancement of bilateral urothelium. Cannot exclude possibility of ascending infection causing pyelonephritis. 6. Decubitus ulcers as described above. Appearance consistent  with osteomyelitis right ischium.   Electronically Signed   By: Skipper Cliche M.D.   On: 12/11/2014 16:54   Ct Abdomen Pelvis W Contrast  12/11/2014   CLINICAL DATA:  Initial evaluation for fever, patient is paraplegic due to spinal cord infarct  EXAM: CT CHEST, ABDOMEN, AND PELVIS WITH CONTRAST  TECHNIQUE: Multidetector CT imaging of the chest, abdomen and pelvis was performed following the standard protocol during bolus administration of intravenous contrast.  CONTRAST:  177mL OMNIPAQUE IOHEXOL 300 MG/ML  SOLN  COMPARISON:  Multiple prior studies including 11/15/2014 CT thorax  FINDINGS: CT CHEST FINDINGS  Right lower lobe fairly extensive consolidation with air bronchograms. In the left lower lobe, consolidation is again identified, showing moderate improvement when compared to the prior study. 11 mm triangular right upper lobe opacity series 3, image number 11 and 12, stable from 11/15/2014. Faint patchy right upper lobe infiltrate new from prior study.  Residual thymic tissue  anterior mediastinum. Numerous likely reactive mediastinal lymph nodes, similar to prior study. Patulous fluid-filled esophagus noted.  Small right pleural effusion. Tiny left pleural effusions similar to prior study.  CT ABDOMEN AND PELVIS FINDINGS  Significant gastric distention with air and fluid. Liver and gallbladder are normal. Spleen is normal. Pancreas is normal.  Adrenal glands are normal. No evidence of hydronephrosis on the left side, although the urothelium of the renal pelvis and ureter enhances mildly. Mild perinephric inflammatory change on the left. On the right, there is evidence of medial lower pole scarring. Mild urothelial enhancement of the renal pelvis and ureter identified on the right as well.  Numerous phleboliths along the course of the distal ureters. There is a suprapubic catheter in the bladder. There is bladder wall thickening.  Left lower quadrant ostomy noted. Nonobstructive bowel gas pattern. Distal colon decompressed.  Decubitus ulcer identified over the inferior sacrum and coccyx. Also evidence of decubitus ulcer with gas within it over the right posterior ischium with evidence of osteomyelitis involving the ischium, consisting of cortical destruction periosteal reaction. No definite osteomyelitis involving the inferior sacrum and coccyx although ulcer extends deep down nearly to the level of bone.  IMPRESSION: 1. Improving left lower lobe pneumonia 2. Pneumonia right lower lobe worse. New faint right upper lobe infiltrate consistent with pneumonia as well. 3. 11 mm nodular opacity right upper lobe. Follow-up chest CT in 3-6 months recommended. This recommendation follows the consensus statement: Guidelines for Management of Small Pulmonary Nodules Detected on CT Scans: A Statement from the [place] as published in Radiology 2005; 237:395-400. 4. Distended stomach similar to prior studies likely due to gastroparesis. 5. Suprapubic catheter with bladder wall thickening.  Enhancement of bilateral urothelium. Cannot exclude possibility of ascending infection causing pyelonephritis. 6. Decubitus ulcers as described above. Appearance consistent with osteomyelitis right ischium.   Electronically Signed   By: Skipper Cliche M.D.   On: 12/11/2014 16:54    Assessment/Plan:  1) fevers with leukocytosis = recent decompensation could be due to new pneumonia ? Aspiration vs. Pyelonephritis.  Favoring pneumonia.Will add differential onto cbc from today. C.difficile has been ruled out. Repeat urine and blood cx thus far are negative from fevers from 3/4. 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  Carlyle Basques, [facility] Infectious Disease [facility] [email or web address] [phone] pager   [phone] cell 12/13/2014, 9:40 AM

## 2014-12-13 NOTE — Progress Notes (Signed)
Inpatient Diabetes Program Recommendations  AACE/ADA: New Consensus Statement on Inpatient Glycemic Control (2013)  Target Ranges:  Prepandial:   less than 140 mg/dL      Peak postprandial:   less than 180 mg/dL (1-2 hours)      Critically ill patients:  140 - 180 mg/dL   Reason for Assessment:  Results for Jason [name], Jason [name] (MRN [id number]) as of 12/13/2014 09:25  Ref. Range 12/12/2014 06:29 12/12/2014 11:21 12/12/2014 17:08 12/13/2014 07:35  Glucose-Capillary Latest Range: 70-99 mg/dL 291 (H) 202 (H) 188 (H) 362 (H)    Note that patient currently not eating.  Consider increasing Novolog sensitive correction to q 4 hours.  Also please increase Lantus to 16 units daily.    Thanks, Adah Perl, RN, BC-ADM Inpatient Diabetes Coordinator Pager [phone]

## 2014-12-13 NOTE — Evaluation (Signed)
Clinical/Bedside Swallow Evaluation Patient Details  Name: Jason [name] MRN: [id number] Date of Birth: 12-Apr-1986  Today's Date: 12/13/2014 Time: SLP Start Time (ACUTE ONLY): 0940 SLP Stop Time (ACUTE ONLY): 1006 SLP Time Calculation (min) (ACUTE ONLY): 26 min  Past Medical History:  Past Medical History  Diagnosis Date  . GERD (gastroesophageal reflux disease)   . Asthma   . Hx MRSA infection     on face  . Gastroparesis   . Diabetic neuropathy   . Seizures   . Family history of anesthesia complication     Pt mother can't have epidural procedures  . Dysrhythmia   . Pneumonia   . Arthritis   . Fibromyalgia   . 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 Type I diabetes mellitus     sees Dr. Loanne Drilling    Past Surgical History:  Past Surgical History  Procedure Laterality Date  . Tonsillectomy    . Multiple extractions with alveoloplasty N/A 08/03/2014    Procedure: MULTIPLE EXTRACTIONS;  Surgeon: Gae Bon, DDS;  Location: [place];  Service: Oral Surgery;  Laterality: N/A;  . Tee without cardioversion N/A 08/17/2014    Procedure: TRANSESOPHAGEAL ECHOCARDIOGRAM (TEE);  Surgeon: Dorothy Spark, MD;  Location: [facility] ENDOSCOPY;  Service: Cardiovascular;  Laterality: N/A;  . Debridment of decubitus ulcer N/A 10/04/2014    Procedure: DEBRIDMENT OF DECUBITUS ULCER;  Surgeon: Georganna Skeans, MD;  Location: [place];  Service: General;  Laterality: N/A;  . Laparoscopic diverted colostomy N/A 10/12/2014    Procedure: LAPAROSCOPIC DIVERTING COLOSTOMY;  Surgeon: Donnie Mesa, MD;  Location: [place];  Service: General;  Laterality: N/A;  . Insertion of suprapubic catheter N/A 10/12/2014    Procedure: INSERTION OF SUPRAPUBIC CATHETER;  Surgeon: Reece Packer, MD;  Location: [place];  Service: Urology;  Laterality: N/A;  . Incision and drainage of wound N/A 11/12/2014    Procedure: IRRIGATION AND DEBRIDEMENT OF WOUNDS WITH BONE BIOPSY AND SURGICAL PREP ;  Surgeon: Theodoro Kos, DO;  Location: [name];  Service: Plastics;  Laterality: N/A;  . Application of a-cell of back N/A 11/12/2014    Procedure: APPLICATION A CELL AND VAC ;  Surgeon: Theodoro Kos, DO;  Location: [place];  Service: Plastics;  Laterality: N/A;  . Incision and drainage of wound N/A 11/18/2014    Procedure: IRRIGATION AND DEBRIDEMENT OF SACRAL ULCER ONLY WITH PLACEMENT OF A CELL AND VAC/ DRESSING CHANGE TO UPPER BACK AREA.;  Surgeon: Theodoro Kos, DO;  Location: [place];  Service: Plastics;  Laterality: N/A;  . Incision and drainage of wound N/A 11/25/2014    Procedure: IRRIGATION AND DEBRIDEMENT OF SACRAL ULCER AND BACK BURN WITH PLACEMENT OF A-CELL;  Surgeon: Theodoro Kos, DO;  Location: [place];  Service: Plastics;  Laterality: N/A;  . Minor application of wound vac N/A 11/25/2014    Procedure:  WOUND VAC CHANGE;  Surgeon: Theodoro Kos, DO;  Location: [place];  Service: Plastics;  Laterality: N/A;  . Incision and drainage of wound Right 12/08/2014    Procedure: IRRIGATION AND DEBRIDEMENT SACRAL WOUND AND RIGHT ISCHIAL WOUND ;  Surgeon: Theodoro Kos, DO;  Location: [place];  Service: Plastics;  Laterality: Right;  . Application of a-cell of back N/A 12/08/2014    Procedure: APPLICATION OF A-CELL AND WOUND VAC ;  Surgeon: Theodoro Kos, DO;  Location: [place];  Service: Plastics;  Laterality: N/A;   HPI:  29 year old male with a history of GERD, spinal cord infarct,  and paraplegia who has had multiple recent infections was hospitalized on 12/01/2014 for hallucinations believed to be related to meropenem. He has a lengthy history of multiple urinary tract infections, skin and soft tissue infections, decubitus ulcers, and was recently being treated for osteomyelitis, Klebsiella bacteremia, and Burkholderia UTI with IV vancomycin and ertapenem, with plans for 6 weeks of treatment. However, he developed hallucinations approximately 3 weeks into treatment.His mental status improved but he developed worsening sepsis and  recurrent altered mental status. CXR showed worsening right lower lobe PNA.    Assessment / Plan / Recommendation Clinical Impression  Patient known to this SLP from previous admissions. As with during previous swallow evaluations, patient presents with a functional appearing oropharyngeal swallow without overt evidence of dysphagia, penetration, or aspiration. Strength of cough weak at baseline due to paraplegia. Discussed results of evaluation with CCM MD. As with during previous evaluations, question impact of poor secretions clearance on recurrence of PNA (7 in 1 year per mother). MD in agreement that no instrumental testing needed at this time. Educated patient and mother on increased risk with positioning limitations and strength of cough. No SLP f/u indicated at this time. Signing off. Regular diet initiated.    Aspiration Risk  Moderate    Diet Recommendation Regular;Thin liquid   Liquid Administration via: Cup;Straw Medication Administration: Whole meds with liquid Supervision: Staff to assist with self feeding;Full supervision/cueing for compensatory strategies Compensations: Slow rate;Small sips/bites Postural Changes and/or Swallow Maneuvers: Seated upright 90 degrees;Upright 30-60 min after meal    Other  Recommendations Oral Care Recommendations: Oral care BID   Follow Up Recommendations  None            Swallow Study    General HPI: 29 year old male with a history of GERD, spinal cord infarct, and paraplegia who has had multiple recent infections was hospitalized on 12/01/2014 for hallucinations believed to be related to meropenem. He has a lengthy history of multiple urinary tract infections, skin and soft tissue infections, decubitus ulcers, and was recently being treated for osteomyelitis, Klebsiella bacteremia, and Burkholderia UTI with IV vancomycin and ertapenem, with plans for 6 weeks of treatment. However, he developed hallucinations approximately 3 weeks into  treatment.His mental status improved but he developed worsening sepsis and recurrent altered mental status. CXR showed worsening right lower lobe PNA.  Type of Study: Bedside swallow evaluation Previous Swallow Assessment: bedside evaluation 12/14; recommended regular diet, thin liquid Diet Prior to this Study: NPO Temperature Spikes Noted: No Respiratory Status: Nasal cannula History of Recent Intubation: No Behavior/Cognition: Alert;Cooperative;Pleasant mood Oral Cavity - Dentition: Poor condition;Missing dentition Self-Feeding Abilities: Needs assist Patient Positioning: Upright in bed Baseline Vocal Quality: Clear Volitional Cough: Weak Volitional Swallow: Able to elicit    Oral/Motor/Sensory Function Overall Oral Motor/Sensory Function: Appears within functional limits for tasks assessed   Ice Chips Ice chips: Not tested   Thin Liquid Thin Liquid: Within functional limits Presentation: Cup;Straw    Nectar Thick Nectar Thick Liquid: Not tested   Honey Thick Honey Thick Liquid: Not tested   Puree Puree: Within functional limits Presentation: Spoon   Solid   GO   Krystol Rocco MA, CCC-SLP [phone]  Solid: Within functional limits       Lyndell Allaire Meryl 12/13/2014,10:13 AM

## 2014-12-13 NOTE — Progress Notes (Signed)
[facility] PROGRESS NOTE  Jason [name] YCX:[id number] DOB: September 12, 1986 DOA: 12/01/2014 PCP: [name], MD  Assessment/Plan:   fever: (started from 3/4) --patient was admitted on 2/24 with confusion/hallucination,decubitus ulcers, was doing better and getting ready to be discharged home on 3/4, however, spiked fever 3/4 pm, discharge cancelled, pan culture ordered. ID consulted. --Ct chest/ab/pelvic persistent lower lobe low infiltrates/pyelo? --On 3/5, patient looks drowsy, no appetite, but aaox3, cooperative. No confusion, no hallucination. Repeat c diff negative. Pan culture pending, repeat ua ? Yeast? Diflucanx1 on 3/5. --3/6 worsening of mental status, very lethargic. Worsening of leukocytosis. Keep spiking fever. Repeat cdiff negative, repeat blood culture no growth. Pan ct as above, Discussed with ID, changed abx to aztreonam/continue vanc/flagyl. PCCM evaluated patient and transferred patient to ICU on 3/6. --3/7 improving, more alert, passed swallow eval, fever trending down, persistent leukocytosis/sinus tachycardia. Awaiting sputum culture. Change nebulizer to xopenex/atrovent due to tachycardia.  Altered mental status-   --admitted with hallucination, patient has complicated history with pneumonia ischial osteomyelitis, Klebsiella bacteremia and Burkhoderia UTI.  ID recommended on admission to stop meropenem and start Flagyl as well as Levaquin along with vancomycin. --back to his baseline on 3/4,  --however, drowsy on 3/5 after fever ,  --very lethargic on 3/6 -more alert 3/7. No hallucination.  Hallucinations -  --no hallucination since 3/3, continue Seroquel 25 mg 3 times a day per psych. Patient's mom requested to stop prn haldol and ativan. 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 It showed multiple food  collections within the abductor magnus and abductor brevis muscles likely consistent with intramuscular abscesses. Patient was seen by plastic surgery underwent debridement on 11/12/2014 11/18/2014 and 11/25/2014 with VAC placement. Patient started on vancomycin and ertapenem as per infectious disease then changed to meropenem, then changed to vancomycin, Flagyl, Levaquin due to hallucination.  - decubitus ulcer debrided again by plastic surgery on 3/2.  -Wound vac malfunction/detatched on 3/5, made plastic surgery aware on 3/5 -on vanc/azetronam/flagyl  Klebsiella pneumonia bacteremia-  --blood culture  was positive for Klebsiella pneumonia during last admission,  --on levaquin/flagyl/ vancomycin this admission before 3/6,  --on aztreonam/vanc/flagyl since 3/6 due to fever, worsening leukocytosis  Burkholderia UTI- during previous admission, finished treatment per ID  Type 1 diabetes mellitus- need to close adjust insulin, fluctuating mental status, inconsistent oral intake.  Functional quadriplegia, neurogenic bladder-  --bedbound, secondary to spinal cord infarction.  -Reported leaking urine from superpubic catheter 3/3-3/4nightt, - Urology consulted 3/4, [name] switched suprapubic catheter, per patient mom, there were  Two liter of urine retained in the bladder that was drained at the time.  DVT prophylaxis- heparin  Code Status: Full code Family Communication: *Spoke to patient's mother at bedside at length  Disposition Plan:  stepdown/icu   Consultants:  Psychiatry, signed off  Neurology, signed off  Plastic surgery   ID  Urology  PCCM  Procedures:   debridement of decubitus ulcer 3/2  Antibiotics:  Vancomycin/flagyl/levaquin from admission to 3/6  Vanc/aztreonam/flagyl from 3/6  diflucanx1 on 3/5   HPI/Subjective: 29 y.o. male with hx of asthma, MRSA infection, seizures, spinal stroke in 4/15, PNA, UTI, ischial osteomyelitis (currently being treated  with IV vancomycin and ertapenem), diabetes mellitus, GERD, who presents with hallucinations and altered mental status. Patient was recently hospitalized from 2/1 to 2/12. He was treated for ischial osteomyelitis, pneumonia, Klebsiella bacteremia, and Burkhoderia UTI  in hospital. Patient was discharged on IV vancomycin plus ertapenem, which are supposed to be continued for total of 6 weeks per ID. Patient has been doing okay until 3 days ago when he started hallucinating. It seems that he is on the phone talking to somebody who is not there. Mother reports pt has not been sleeping at all in the past 3 days. Mother states that patient had hallucination 3 months ago, which was likely caused by morphine, since his symptoms resolved after discontinuation of morphine. This time, mother discontinued patient's Percocet, Lyrica and baclofen without any help. His mother states that patient developed new rashes over his forehead and upper face. He has back pain. No headache, neck rigidity or photophobia. Patient denies fever, chills, cough, chest pain, SOB, nausea, vomiting, abdominal pain, diarrhea, dysuria, urgency, frequency, hematuria or leg swelling.  In ED, patient was found to have negative CT head for acute abnormalities. Leukocytosis with WBC 13.0, negative UDS, lactate 1.06, positive urinalysis for moderate leukocytes, negative salicylate and acetaminophen level, ammonia 17, temperature normal. Electrolytes okay. Patient's admitted to inpatient for further evaluation and treatment.  Interval history: Better, more alert, aaox3, reported feeling better, fever trending down, persistent leukocytosis/sinus tachycardia. Objective: Filed Vitals:   12/13/14 1300  BP: 102/59  Pulse: 116  Temp:   Resp: 18    Intake/Output Summary (Last 24 hours) at 12/13/14 1659 Last data filed at 12/13/14 1300  Gross per 24 hour  Intake    800 ml  Output   1150 ml  Net   -350 ml   Filed Weights   12/05/14 0500 12/08/14  0635 12/09/14 0500  Weight: 56.7 kg (125 lb) 56.7 kg (125 lb) 58.968 kg (130 lb)    Exam:   General:  drowsy  Cardiovascular: S1-S2 regular  Respiratory: diminished at bases  Abdomen: Soft nontender no organomegaly, +colostomy .+suprapubic catheter  Musculoskeletal: No edema  , quadriplegia  Skin: multiple wounds, detail  per wound care rn notes and plastic surgery note.  Data Reviewed: Basic Metabolic Panel:  Recent Labs Lab 12/08/14 0514 12/10/14 0435 12/11/14 0845 12/12/14 0458 12/13/14 0345  NA 137 138 140 136 137  K 3.8 3.4* 3.5 3.7 3.7  CL 112 112 114* 109 112  CO2 22 22 25 22  16*  GLUCOSE 86 127* 73 307* 358*  BUN 8 7 11 12 15   CREATININE 0.62 0.68 0.75 0.85 1.08  CALCIUM 9.2 9.1 9.3 9.3 9.6   Liver Function Tests:  Recent Labs Lab 12/12/14 0458  AST 16  ALT 13  ALKPHOS 83  BILITOT 0.7  PROT 6.4  ALBUMIN 2.1*   No results for input(s): LIPASE, AMYLASE in the last 168 hours. No results for input(s): AMMONIA in the last 168 hours. CBC:  Recent Labs Lab 12/08/14 0514 12/10/14 0435 12/11/14 0845 12/12/14 0458 12/13/14 0345  WBC 13.5* 14.5* 16.6* 30.5* 32.0*  HGB 7.3* 7.3* 7.2* 7.2* 7.1*  HCT 22.8* 22.7* 22.5* 22.2* 22.2*  MCV 84.1 83.5 83.3 83.1 83.5  PLT 416* 482* 487* 492* 527*   Cardiac Enzymes: No results for input(s): CKTOTAL, CKMB, CKMBINDEX, TROPONINI in the last 168 hours. BNP (last 3 results) No results for input(s): BNP in the last 8760 hours.  ProBNP (last 3 results)  Recent Labs  01/29/14 2113  PROBNP 1343.0*    CBG:  Recent Labs Lab 12/12/14 0629 12/12/14 1121 12/12/14 1708 12/13/14 0735 12/13/14 1235  GLUCAP 291* 202* 188* 362* 362*    Recent Results (from the past 240  hour(s))  Surgical pcr screen     Status: None   Collection Time: 12/08/14  8:39 AM  Result Value Ref Range Status   MRSA, PCR NEGATIVE NEGATIVE Final   Staphylococcus aureus NEGATIVE NEGATIVE Final    Comment:        The Xpert SA Assay  (FDA approved for NASAL specimens in patients over 91 years of age), is one component of a comprehensive surveillance program.  Test performance has been validated by [facility] for patients greater than or equal to 74 year old. It is not intended to diagnose infection nor to guide or monitor treatment.   Culture, blood (routine x 2)     Status: None (Preliminary result)   Collection Time: 12/10/14  4:55 PM  Result Value Ref Range Status   Specimen Description Blood  Final   Special Requests NONE  Final   Culture   Final           BLOOD CULTURE RECEIVED NO GROWTH TO DATE CULTURE WILL BE HELD FOR 5 DAYS BEFORE ISSUING A FINAL NEGATIVE REPORT Performed at [facility]    Report Status PENDING  Incomplete  Culture, blood (routine x 2)     Status: None (Preliminary result)   Collection Time: 12/10/14  5:00 PM  Result Value Ref Range Status   Specimen Description BLOOD LEFT ARM  Final   Special Requests BOTTLES DRAWN AEROBIC ONLY 1 CC  Final   Culture   Final           BLOOD CULTURE RECEIVED NO GROWTH TO DATE CULTURE WILL BE HELD FOR 5 DAYS BEFORE ISSUING A FINAL NEGATIVE REPORT Performed at [facility]    Report Status PENDING  Incomplete  Urine culture     Status: None   Collection Time: 12/10/14  6:11 PM  Result Value Ref Range Status   Specimen Description URINE, CATHETERIZED  Final   Special Requests NONE  Final   Colony Count NO GROWTH Performed at [facility]   Final   Culture NO GROWTH Performed at [facility]   Final   Report Status 12/12/2014 FINAL  Final  Clostridium Difficile by PCR     Status: None   Collection Time: 12/12/14  4:22 AM  Result Value Ref Range Status   C difficile by pcr NEGATIVE NEGATIVE Final  MRSA PCR Screening     Status: None   Collection Time: 12/12/14  3:27 PM  Result Value Ref Range Status   MRSA by PCR NEGATIVE NEGATIVE Final    Comment:        The GeneXpert MRSA Assay (FDA approved for NASAL  specimens only), is one component of a comprehensive MRSA colonization surveillance program. It is not intended to diagnose MRSA infection nor to guide or monitor treatment for MRSA infections.      Studies: No results found.  Scheduled Meds: . antiseptic oral rinse  7 mL Mouth Rinse q12n4p  . atorvastatin  20 mg Oral Daily  . aztreonam  1 g Intravenous 3 times per day  . baclofen  10 mg Oral BID WC  . baclofen  20 mg Oral QHS  . chlorhexidine  15 mL Mouth Rinse BID  . feeding supplement (GLUCERNA SHAKE)  237 mL Oral TID BM  . feeding supplement (PRO-STAT SUGAR FREE 64)  30 mL Oral BID  . fentaNYL  25 mcg Transdermal Q72H  . guaiFENesin  600 mg Oral BID  . heparin  5,000 Units Subcutaneous  3 times per day  . insulin aspart  0-9 Units Subcutaneous TID WC  . insulin glargine  18 Units Subcutaneous QHS  . ipratropium  0.5 mg Nebulization Q6H  . lactobacillus  1 g Oral TID WC  . levalbuterol  1.25 mg Nebulization 4 times per day  . lidocaine  1 application Topical Once per day on Mon Wed Fri  . liver oil-zinc oxide   Topical BID  . metronidazole  500 mg Intravenous Q8H  . mirtazapine  15 mg Oral QHS  . mupirocin ointment  1 application Nasal Once per day on Mon Wed Fri  . pantoprazole  40 mg Oral Daily  . pregabalin  100 mg Oral TID AC  . pregabalin  200 mg Oral QHS  . sodium chloride  3 mL Intravenous Q12H  . vancomycin  1,500 mg Intravenous Q24H   Continuous Infusions: . lactated ringers 10 mL/hr at 12/12/14 1557    Principal Problem:   Acute encephalopathy Active Problems:   Type 1 diabetes, uncontrolled, with neuropathy   Asthma   GERD   Gastroparesis due to DM   Protein-calorie malnutrition, severe   Spinal cord infarction (history of)   Tetraplegia   Neurogenic bladder   UTI (urinary tract infection)   Sacral pressure sore   Stage IV decubitus ulcer   Osteomyelitis, pelvis   Anemia   Burkholderia cepacia infection   Disorientation   PICC (peripherally  inserted central catheter) in place   Temperature elevated     Deneisha Dade  [facility] Pager [phone]. If 7PM-7AM, please contact night-coverage at [email or web address], password [facility] 12/13/2014, 4:59 PM  LOS: 12 days

## 2014-12-13 NOTE — Progress Notes (Signed)
UR Completed.  [phone]  

## 2014-12-14 ENCOUNTER — Encounter (HOSPITAL_COMMUNITY): Payer: Self-pay | Admitting: Plastic Surgery

## 2014-12-14 ENCOUNTER — Inpatient Hospital Stay (HOSPITAL_COMMUNITY): Payer: Medicaid Other

## 2014-12-14 LAB — BASIC METABOLIC PANEL
ANION GAP: 2 — AB (ref 5–15)
BUN: 19 mg/dL (ref 6–23)
CO2: 22 mmol/L (ref 19–32)
Calcium: 9.4 mg/dL (ref 8.4–10.5)
Chloride: 112 mmol/L (ref 96–112)
Creatinine, Ser: 1.35 mg/dL (ref 0.50–1.35)
GFR calc non Af Amer: 69 mL/min — ABNORMAL LOW (ref >90)
GFR, EST AFRICAN AMERICAN: 80 mL/min — AB (ref >90)
GLUCOSE: 319 mg/dL — AB (ref 70–99)
POTASSIUM: 3.3 mmol/L — AB (ref 3.5–5.1)
Sodium: 136 mmol/L (ref 135–145)

## 2014-12-14 LAB — GLUCOSE, CAPILLARY
GLUCOSE-CAPILLARY: 293 mg/dL — AB (ref 70–99)
GLUCOSE-CAPILLARY: 178 mg/dL — AB (ref 70–99)
Glucose-Capillary: 157 mg/dL — ABNORMAL HIGH (ref 70–99)
Glucose-Capillary: 170 mg/dL — ABNORMAL HIGH (ref 70–99)
Glucose-Capillary: 198 mg/dL — ABNORMAL HIGH (ref 70–99)
Glucose-Capillary: 300 mg/dL — ABNORMAL HIGH (ref 70–99)

## 2014-12-14 LAB — BLOOD GAS, ARTERIAL
Acid-base deficit: 6.4 mmol/L — ABNORMAL HIGH (ref 0.0–2.0)
Bicarbonate: 18.8 mEq/L — ABNORMAL LOW (ref 20.0–24.0)
DRAWN BY: 39899
FIO2: 1 %
LHR: 16 {breaths}/min
MECHVT: 500 mL
O2 SAT: 99.7 %
PATIENT TEMPERATURE: 98.6
PEEP: 5 cmH2O
TCO2: 20 mmol/L (ref 0–100)
pCO2 arterial: 38.6 mmHg (ref 35.0–45.0)
pH, Arterial: 7.308 — ABNORMAL LOW (ref 7.350–7.450)
pO2, Arterial: 496 mmHg — ABNORMAL HIGH (ref 80.0–100.0)

## 2014-12-14 LAB — POCT I-STAT 3, ART BLOOD GAS (G3+)
ACID-BASE DEFICIT: 9 mmol/L — AB (ref 0.0–2.0)
Bicarbonate: 18.6 mEq/L — ABNORMAL LOW (ref 20.0–24.0)
O2 Saturation: 79 %
PH ART: 7.207 — AB (ref 7.350–7.450)
PO2 ART: 53 mmHg — AB (ref 80.0–100.0)
Patient temperature: 98.6
TCO2: 20 mmol/L (ref 0–100)
pCO2 arterial: 46.7 mmHg — ABNORMAL HIGH (ref 35.0–45.0)

## 2014-12-14 LAB — MAGNESIUM: Magnesium: 1.4 mg/dL — ABNORMAL LOW (ref 1.5–2.5)

## 2014-12-14 LAB — CBC
HEMATOCRIT: 19.7 % — AB (ref 39.0–52.0)
Hemoglobin: 6.4 g/dL — CL (ref 13.0–17.0)
MCH: 26.8 pg (ref 26.0–34.0)
MCHC: 32.5 g/dL (ref 30.0–36.0)
MCV: 82.4 fL (ref 78.0–100.0)
Platelets: 478 10*3/uL — ABNORMAL HIGH (ref 150–400)
RBC: 2.39 MIL/uL — ABNORMAL LOW (ref 4.22–5.81)
RDW: 17.4 % — ABNORMAL HIGH (ref 11.5–15.5)
WBC: 26.9 10*3/uL — ABNORMAL HIGH (ref 4.0–10.5)

## 2014-12-14 LAB — PHOSPHORUS: PHOSPHORUS: 3.1 mg/dL (ref 2.3–4.6)

## 2014-12-14 LAB — PREPARE RBC (CROSSMATCH)

## 2014-12-14 MED ORDER — MIDAZOLAM HCL 2 MG/2ML IJ SOLN
2.0000 mg | Freq: Once | INTRAMUSCULAR | Status: AC
  Administered 2014-12-14: 2 mg via INTRAVENOUS

## 2014-12-14 MED ORDER — FENTANYL CITRATE 0.05 MG/ML IJ SOLN
100.0000 ug | INTRAMUSCULAR | Status: DC | PRN
  Administered 2014-12-14 (×3): 100 ug via INTRAVENOUS
  Filled 2014-12-14 (×2): qty 2

## 2014-12-14 MED ORDER — MIDAZOLAM HCL 2 MG/2ML IJ SOLN
INTRAMUSCULAR | Status: AC
  Administered 2014-12-14: 2 mg via INTRAVENOUS
  Filled 2014-12-14: qty 4

## 2014-12-14 MED ORDER — INSULIN ASPART 100 UNIT/ML ~~LOC~~ SOLN
0.0000 [IU] | Freq: Three times a day (TID) | SUBCUTANEOUS | Status: DC
  Administered 2014-12-14: 2 [IU] via SUBCUTANEOUS
  Administered 2014-12-15: 3 [IU] via SUBCUTANEOUS
  Administered 2014-12-15: 2 [IU] via SUBCUTANEOUS
  Administered 2014-12-15: 5 [IU] via SUBCUTANEOUS

## 2014-12-14 MED ORDER — FENTANYL CITRATE 0.05 MG/ML IJ SOLN
100.0000 ug | Freq: Once | INTRAMUSCULAR | Status: AC
  Administered 2014-12-14: 100 ug via INTRAVENOUS

## 2014-12-14 MED ORDER — DOCUSATE SODIUM 100 MG PO CAPS: 100.0000 mg | ORAL_CAPSULE | Freq: Two times a day (BID) | ORAL | Status: DC | PRN

## 2014-12-14 MED ORDER — FENTANYL CITRATE 0.05 MG/ML IJ SOLN: 50.0000 ug | Freq: Once | INTRAMUSCULAR | Status: DC

## 2014-12-14 MED ORDER — SODIUM CHLORIDE 0.9 % IV SOLN
25.0000 ug/h | INTRAVENOUS | Status: DC
  Filled 2014-12-14: qty 50

## 2014-12-14 MED ORDER — FENTANYL BOLUS VIA INFUSION
50.0000 ug | INTRAVENOUS | Status: DC | PRN
  Administered 2014-12-15 (×4): 50 ug via INTRAVENOUS
  Filled 2014-12-14: qty 50

## 2014-12-14 MED ORDER — FENTANYL BOLUS VIA INFUSION
50.0000 ug | INTRAVENOUS | Status: DC | PRN
  Filled 2014-12-14: qty 50

## 2014-12-14 MED ORDER — FENTANYL CITRATE 0.05 MG/ML IJ SOLN
100.0000 ug | INTRAMUSCULAR | Status: DC | PRN
  Filled 2014-12-14: qty 2

## 2014-12-14 MED ORDER — SODIUM CHLORIDE 0.9 % IV BOLUS (SEPSIS)
500.0000 mL | Freq: Once | INTRAVENOUS | Status: AC
  Administered 2014-12-14: 500 mL via INTRAVENOUS

## 2014-12-14 MED ORDER — DOCUSATE SODIUM 50 MG/5ML PO LIQD
100.0000 mg | Freq: Two times a day (BID) | ORAL | Status: DC | PRN
  Filled 2014-12-14: qty 10

## 2014-12-14 MED ORDER — INSULIN GLARGINE 100 UNIT/ML ~~LOC~~ SOLN
5.0000 [IU] | Freq: Every day | SUBCUTANEOUS | Status: DC
  Administered 2014-12-14 – 2014-12-15 (×2): 5 [IU] via SUBCUTANEOUS
  Filled 2014-12-14 (×2): qty 0.05

## 2014-12-14 MED ORDER — ETOMIDATE 2 MG/ML IV SOLN
10.0000 mg | Freq: Once | INTRAVENOUS | Status: AC
  Administered 2014-12-14: 10 mg via INTRAVENOUS

## 2014-12-14 MED ORDER — FENTANYL CITRATE 0.05 MG/ML IJ SOLN
INTRAMUSCULAR | Status: AC
  Administered 2014-12-14: 100 ug via INTRAVENOUS
  Filled 2014-12-14: qty 4

## 2014-12-14 MED ORDER — POTASSIUM CHLORIDE CRYS ER 20 MEQ PO TBCR
20.0000 meq | EXTENDED_RELEASE_TABLET | Freq: Once | ORAL | Status: AC
  Administered 2014-12-14: 20 meq via ORAL
  Filled 2014-12-14: qty 1

## 2014-12-14 MED ORDER — SODIUM CHLORIDE 0.9 % IV SOLN
25.0000 ug/h | INTRAVENOUS | Status: DC
  Administered 2014-12-14: 100 ug/h via INTRAVENOUS
  Administered 2014-12-15: 125 ug/h via INTRAVENOUS
  Administered 2014-12-16: 100 ug/h via INTRAVENOUS
  Administered 2014-12-18: 25 ug/h via INTRAVENOUS
  Filled 2014-12-14 (×5): qty 50

## 2014-12-14 MED ORDER — VITAL HIGH PROTEIN PO LIQD
1000.0000 mL | ORAL | Status: DC
  Administered 2014-12-14: 1000 mL
  Administered 2014-12-14 (×3)
  Filled 2014-12-14 (×3): qty 1000

## 2014-12-14 MED ORDER — FENTANYL CITRATE 0.05 MG/ML IJ SOLN
50.0000 ug | Freq: Once | INTRAMUSCULAR | Status: AC
  Administered 2014-12-14: 50 ug via INTRAVENOUS

## 2014-12-14 MED ORDER — SODIUM CHLORIDE 0.9 % IV SOLN: Freq: Once | INTRAVENOUS | Status: DC

## 2014-12-14 NOTE — Progress Notes (Signed)
CRITICAL VALUE ALERT  Critical value received:  Hemoglobin 6.4  Date of notification:  12/14/14  Time of notification:  0515  Critical value read back:Yes.    Nurse who received alert:  Archie Endo, RN  MD notified (1st page):  Dr. Lissa Hoard  Time of first page:  [phone]  Responding MD:  Dr. Jimmy Footman  Time MD responded:  [phone]  Orders received.  Will continue to monitor closely.

## 2014-12-14 NOTE — Procedures (Signed)
Intubation Procedure Note Jason [name] [id number] 07-02-1986  Procedure: Intubation Indications: Respiratory insufficiency  Procedure Details Consent: Risks of procedure as well as the alternatives and risks of each were explained to the (patient/caregiver).  Consent for procedure obtained. Time Out: Verified patient identification, verified procedure, site/side was marked, verified correct patient position, special equipment/implants available, medications/allergies/relevent history reviewed, required imaging and test results available.  Performed  Maximum sterile technique was used including gloves, hand hygiene and mask.  MAC and 3    Evaluation Hemodynamic Status: BP stable throughout; O2 sats: transiently fell during during procedure Patient's Current Condition: stable Complications: No apparent complications Patient did tolerate procedure well. Chest X-ray ordered to verify placement.  CXR: pending.   Performed by Noe Gens, NP with glidescope.  I was present for procedure.  Chesley Mires, MD [facility] Pulmonary/Critical Care 12/14/2014, 1:31 PM Pager:  [phone] After 3pm call: [phone]

## 2014-12-14 NOTE — Progress Notes (Signed)
MD and NP at bedside reviewing chest x-ray and ABG. Discussed with family present the need to intubate patient.

## 2014-12-14 NOTE — Progress Notes (Signed)
PULMONARY / CRITICAL CARE MEDICINE   Name: Jason [name] MRN: [id number] DOB: 1985/12/14    ADMISSION DATE:  12/01/2014 CONSULTATION DATE:  12/12/2014  REFERRING MD :  Erlinda Hong  CHIEF COMPLAINT:  Confusion  INITIAL PRESENTATION: 29 year old male with a history of spinal cord infarct and paraplegia who has had multiple recent infections was hospitalized on 12/01/2014 for hallucinations believed to be related to meropenem. His mental status improved but he developed worsening sepsis and recurrent altered mental status at pulmonary and critical care medicine was consulted on 12/12/2014.  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  See ABG   VITAL SIGNS: Temp:  [97.5 F (36.4 C)-102.7 F (39.3 C)] 97.5 F (36.4 C) (03/08 1117) Pulse Rate:  [87-123] 88 (03/08 1150) Resp:  [10-21] 13 (03/08 1150) BP: (87-121)/(52-73) 107/69 mmHg (03/08 1100) SpO2:  [84 %-100 %] 91 % (03/08 1150) FiO2 (%):  [40 %-55 %] 55 % (03/08 1150) Weight:  [128 lb 4.9 oz (58.2 kg)] 128 lb 4.9 oz (58.2 kg) (03/08 0545)   HEMODYNAMICS:     VENTILATOR SETTINGS: Vent Mode:  [-]  FiO2 (%):  [40 %-55 %] 55 %   INTAKE / OUTPUT:  Intake/Output Summary (Last 24 hours) at 12/14/14 1249 Last data filed at 12/14/14 1100  Gross per 24 hour  Intake 1548.33 ml  Output   1015 ml  Net 533.33 ml    PHYSICAL EXAMINATION: General:  Chronically ill young adult Neuro:  lethargic but opens eyes to voice, transiently focuses but does not verbalize or follow commands, can move arms  bilaterally but wrist and hands are in contracture, legs in boots, no lower extremity movement HEENT:  NCAT Cardiovascular:  s1s2 rrr, no m/r/g Lungs:  Shallow, increased accessory muscle usage, diminished on L Abdomen:  NTND, bsx4 active  Musculoskeletal:  Atrophy noted throughout Skin:  Unable to examine sacral wounds, dressings c/d/i  LABS:  CBC  Recent Labs Lab 12/12/14 0458 12/13/14 0345 12/14/14 0415  WBC 30.5* 32.0* 26.9*  HGB 7.2* 7.1* 6.4*  HCT 22.2* 22.2* 19.7*  PLT 492* 527* 478*   Coag's No results for input(s): APTT, INR in the last 168 hours.   BMET  Recent Labs Lab 12/12/14 0458 12/13/14 0345 12/14/14 0415  NA 136 137 136  K 3.7 3.7 3.3*  CL 109 112 112  CO2 22 16* 22  BUN 12 15 19   CREATININE 0.85 1.08 1.35  GLUCOSE 307* 358* 319*   Electrolytes  Recent Labs Lab 12/12/14 0458 12/13/14 0345 12/14/14 0415  CALCIUM 9.3 9.6 9.4  MG  --   --  1.4*  PHOS  --   --  3.1   Sepsis Markers  Recent Labs Lab 12/12/14 1400  LATICACIDVEN 0.9   ABG  Recent Labs Lab 12/12/14 1554  PHART 7.310*  PCO2ART 36.3  PO2ART 60.0*   Liver Enzymes  Recent Labs Lab 12/12/14 0458  AST 16  ALT 13  ALKPHOS 83  BILITOT 0.7  ALBUMIN 2.1*   Cardiac Enzymes No results for input(s): TROPONINI, PROBNP in the last 168 hours.   Glucose  Recent Labs Lab  12/13/14 1653 12/13/14 1656 12/13/14 2155 12/14/14 0453 12/14/14 0713 12/14/14 1115  GLUCAP 433* 443* 443* 293* 300* 198*   Imaging No results found.  ASSESSMENT / PLAN:  PULMONARY A:  Acute Respiratory Failure - concern for new RLL infiltrate / possible aspiration in setting of sepsis  Healthcare Associated Pneumonia P:   Now intubation  F/U ABG / CXR post intubation  Trend CXR  Wean PEEP / FiO2 for saturations > 90%  CARDIOVASCULAR PICC line February 12 >>  A:  Sepsis without shock Chronic Hypotension - on midodrine  P:  Telemetry monitoring. KVO IVF. Continue midodrine    RENAL A:   Chronic suprapubic catheter P:   Urinalysis WBC TNT with positive nitrites.  Yeast and bacteria present. Monitor BMET and UOP. Replace electrolytes as needed. KCL 20 mEq x1  GASTROINTESTINAL A:   Aspiration Risk P:   NPO OGT Begin TF  Will need swallow evaluation once extubated  HEMATOLOGIC A:   Leukocytosis - in setting of severe sepsis  Chronic anemia without evidence of bleeding P:  Monitor for bleeding. Heparin SQ for DVT   INFECTIOUS A:   Severe sepsis - developed over the weekend 3/4-3/6, etiology may be pyelonephritis versus new right lower lobe pneumonia (HCAP) Chronic infections include wound infection and osteomyelitis of the ischium   P:   3/06 C. difficile >> negative  3 3/04 blood culture> negative 2/24 blood culture> negative 2/25 urine culture> no growth  ID to see  Antibiotics per ID  Continue vancomycin and azactam 3/6>>> Agree with addition of gram-negative coverage for (HCAP)  ENDOCRINE A:   DM2 P:  SSI, CBG Q4 while intubated Reduce lantus to 5 units QHS after intubation (was on 18 units)   NEUROLOGIC A:   Acute encephalopathy > in setting of sepsis Recent hallucinations felt to be due to carbapenem P:   Continue current regimen Attempt PRN sedation only Continue fentanyl patch for now   FAMILY  - Updates:  Mother & Father updated at length regarding clinical status.     Noe Gens, NP-C  Pulmonary & Critical Care Pgr: [phone] or [phone]    12/14/2014, 12:49 PM   Reviewed above, examined.  He developed progressive dyspnea with increased work of breath.  This was associated with decline in mental status.  He was somnolent, and had minimal air movement.  He required intubation.  Will f/u CXR, ABG.  Continue Abx per ID.     Pt's parents update about plan.  CC time by me independent of APP/procedure time is 35 minutes.  Chesley Mires, MD [facility] Pulmonary/Critical Care 12/14/2014, 1:29 PM Pager:   [phone] After 3pm call: [phone]

## 2014-12-14 NOTE — Progress Notes (Signed)
NP brandi called regarding sudden drop in pts BP 70s-80s/40s-50. And desat to 77% when FIO2 was decreased from 100%-50% per RT. PT has not received any sedation since intubated and has not been having issues with maintaining a BP.  Order to give 500cc NS bolus. If that helped then give another 500cc NS bolus if needed afterwards to increase pressure more.

## 2014-12-14 NOTE — Progress Notes (Signed)
Inpatient Diabetes Program Recommendations  AACE/ADA: New Consensus Statement on Inpatient Glycemic Control (2013)  Target Ranges:  Prepandial:   less than 140 mg/dL      Peak postprandial:   less than 180 mg/dL (1-2 hours)      Critically ill patients:  140 - 180 mg/dL   Reason for Assessment:  Results for CORNELIS, KLUVER (MRN [id number]) as of 12/14/2014 08:31  Ref. Range 12/13/2014 16:53 12/13/2014 16:56 12/13/2014 21:55 12/14/2014 04:53 12/14/2014 07:13  Glucose-Capillary Latest Range: 70-99 mg/dL 433 (H) 443 (H) 443 (H) 293 (H) 300 (H)    Diabetes history:  Type 1 diabetes Outpatient Diabetes medications: Lantus 25 units q HS, Novolog 5 units tid with meals Current orders for Inpatient glycemic control:  Lantus 18 units q HS, Novolog sensitive tid with meals  Consider increasing Lantus to 22 units q HS.  Also please add Novolog meal coverage 4 units tid with meals.  Will follow.  Thanks, Adah Perl, RN, BC-ADM Inpatient Diabetes Coordinator Pager [phone]

## 2014-12-14 NOTE — Progress Notes (Signed)
eLink Physician-Brief Progress Note Patient Name: WADSWORTH SKOLNICK DOB: 10/23/1985 MRN: [id number]   Date of Service  12/14/2014  HPI/Events of Note  Hgb this AM 6.4 down from 7.1  eICU Interventions  Plan: Transfuse 1 unit pRBC Post-transfusion CBC        Roniesha Hollingshead 12/14/2014, 5:27 AM

## 2014-12-14 NOTE — Progress Notes (Signed)
Called by RN regarding patient complaint of shortness of breath.  Received unit of blood approximately one hour prior to complaints.  O2 requirements increased to 40% VM.     Plan: Assess CXR now Consider lasix after cxr review Oxygen to support sats >90% Pulmonary hygiene as able, upright positioning.  ABG now   Jason Gens, NP-C [place] Pulmonary & Critical Care Pgr: [phone] or [phone]  Chesley Mires, MD [place] 12/14/2014, 1:30 PM Pager:  [phone] After 3pm call: [phone]

## 2014-12-14 NOTE — Progress Notes (Signed)
OT Cancellation Note  Patient Details Name: Jason [name] MRN: [id number] DOB: 02/23/1986   Cancelled Treatment:    Reason Eval/Treat Not Completed: Medical issues which prohibited therapy (Pt intubated.)  Malka So 12/14/2014, 2:09 PM

## 2014-12-14 NOTE — Progress Notes (Signed)
Noe Gens NP for ccm notified of increases work of breathing and desat to low 80s. Pt stated it was harder to breath and his breathing felt "cold". Pt was increased to 6L via [place] with no improvement. Venti Mask placed at 40% still no improvement, was then increased to 55% and sats increased to mid 90s. NP ordered ABG and stat chest xray.  Will cont to monitor and assess

## 2014-12-14 NOTE — Progress Notes (Signed)
Bite block inserted in patient's mouth, due to patient continuously biting down on ETT.

## 2014-12-15 ENCOUNTER — Inpatient Hospital Stay (HOSPITAL_COMMUNITY): Payer: Medicaid Other

## 2014-12-15 DIAGNOSIS — J9601 Acute respiratory failure with hypoxia: Secondary | ICD-10-CM

## 2014-12-15 DIAGNOSIS — R000 Tachycardia, unspecified: Secondary | ICD-10-CM

## 2014-12-15 DIAGNOSIS — R0600 Dyspnea, unspecified: Secondary | ICD-10-CM

## 2014-12-15 DIAGNOSIS — J4520 Mild intermittent asthma, uncomplicated: Secondary | ICD-10-CM

## 2014-12-15 LAB — GLUCOSE, CAPILLARY
GLUCOSE-CAPILLARY: 180 mg/dL — AB (ref 70–99)
GLUCOSE-CAPILLARY: 323 mg/dL — AB (ref 70–99)
Glucose-Capillary: 196 mg/dL — ABNORMAL HIGH (ref 70–99)
Glucose-Capillary: 244 mg/dL — ABNORMAL HIGH (ref 70–99)
Glucose-Capillary: 265 mg/dL — ABNORMAL HIGH (ref 70–99)
Glucose-Capillary: 324 mg/dL — ABNORMAL HIGH (ref 70–99)
Glucose-Capillary: 376 mg/dL — ABNORMAL HIGH (ref 70–99)

## 2014-12-15 LAB — BASIC METABOLIC PANEL
Anion gap: 6 (ref 5–15)
BUN: 21 mg/dL (ref 6–23)
CALCIUM: 9.8 mg/dL (ref 8.4–10.5)
CHLORIDE: 114 mmol/L — AB (ref 96–112)
CO2: 19 mmol/L (ref 19–32)
Creatinine, Ser: 0.95 mg/dL (ref 0.50–1.35)
GFR calc non Af Amer: >90 mL/min (ref >90)
Glucose, Bld: 194 mg/dL — ABNORMAL HIGH (ref 70–99)
Potassium: 3.4 mmol/L — ABNORMAL LOW (ref 3.5–5.1)
Sodium: 139 mmol/L (ref 135–145)

## 2014-12-15 LAB — CBC
HCT: 24.1 % — ABNORMAL LOW (ref 39.0–52.0)
HEMOGLOBIN: 7.9 g/dL — AB (ref 13.0–17.0)
MCH: 27 pg (ref 26.0–34.0)
MCHC: 32.8 g/dL (ref 30.0–36.0)
MCV: 82.3 fL (ref 78.0–100.0)
PLATELETS: 499 10*3/uL — AB (ref 150–400)
RBC: 2.93 MIL/uL — ABNORMAL LOW (ref 4.22–5.81)
RDW: 16.9 % — ABNORMAL HIGH (ref 11.5–15.5)
WBC: 24.6 10*3/uL — AB (ref 4.0–10.5)

## 2014-12-15 MED ORDER — INSULIN GLARGINE 100 UNIT/ML ~~LOC~~ SOLN: 15.0000 [IU] | Freq: Every day | SUBCUTANEOUS | Status: DC

## 2014-12-15 MED ORDER — SODIUM CHLORIDE 0.9 % IV SOLN
INTRAVENOUS | Status: DC
  Administered 2014-12-17 – 2014-12-22 (×4): via INTRAVENOUS

## 2014-12-15 MED ORDER — INSULIN ASPART 100 UNIT/ML ~~LOC~~ SOLN
0.0000 [IU] | SUBCUTANEOUS | Status: DC
  Administered 2014-12-15: 11 [IU] via SUBCUTANEOUS
  Administered 2014-12-16: 3 [IU] via SUBCUTANEOUS
  Administered 2014-12-16: 8 [IU] via SUBCUTANEOUS
  Administered 2014-12-16: 5 [IU] via SUBCUTANEOUS
  Administered 2014-12-16 (×2): 8 [IU] via SUBCUTANEOUS
  Administered 2014-12-16: 11 [IU] via SUBCUTANEOUS
  Administered 2014-12-17: 3 [IU] via SUBCUTANEOUS
  Administered 2014-12-17: 8 [IU] via SUBCUTANEOUS
  Administered 2014-12-17 (×4): 5 [IU] via SUBCUTANEOUS
  Administered 2014-12-18 (×2): 3 [IU] via SUBCUTANEOUS
  Administered 2014-12-18: 8 [IU] via SUBCUTANEOUS
  Administered 2014-12-18 (×2): 5 [IU] via SUBCUTANEOUS
  Administered 2014-12-18 – 2014-12-19 (×3): 3 [IU] via SUBCUTANEOUS

## 2014-12-15 MED ORDER — POTASSIUM CHLORIDE CRYS ER 20 MEQ PO TBCR
40.0000 meq | EXTENDED_RELEASE_TABLET | Freq: Once | ORAL | Status: AC
  Administered 2014-12-15: 40 meq via ORAL
  Filled 2014-12-15: qty 2

## 2014-12-15 MED ORDER — IBUPROFEN 100 MG/5ML PO SUSP
400.0000 mg | Freq: Four times a day (QID) | ORAL | Status: DC | PRN
  Administered 2014-12-15 – 2014-12-17 (×2): 400 mg
  Filled 2014-12-15 (×4): qty 20

## 2014-12-15 MED ORDER — VITAL AF 1.2 CAL PO LIQD
1000.0000 mL | ORAL | Status: DC
  Administered 2014-12-15 – 2014-12-17 (×3): 1000 mL
  Filled 2014-12-15 (×6): qty 1000

## 2014-12-15 MED ORDER — INSULIN GLARGINE 100 UNIT/ML ~~LOC~~ SOLN
15.0000 [IU] | Freq: Every day | SUBCUTANEOUS | Status: DC
  Administered 2014-12-16 – 2014-12-19 (×4): 15 [IU] via SUBCUTANEOUS
  Filled 2014-12-15 (×4): qty 0.15

## 2014-12-15 NOTE — Progress Notes (Signed)
Reported UOP slowing down to approximately 30 ml/hr and heart rate increasing to 110's.  Ordered to increase fluids to 50 ml/hr.

## 2014-12-15 NOTE — Progress Notes (Signed)
[place] Progress Note Patient Name: Jason [name] DOB: March 18, 1986 MRN: [id number]   Date of Service  12/15/2014  HPI/Events of Note  Fever, tachycardia. Recent cultures reviewed. Current abx reviewed. Has received acetaminophen  eICU Interventions  PRN ibuprofen        Merton Border 12/15/2014, 4:42 PM

## 2014-12-15 NOTE — Progress Notes (Signed)
[place] for Infectious Disease  Date of Admission:  12/01/2014  Antibiotics: Vancomycin, day #4 of aztreonam,  flagyl  Subjective: Respiratory decompensation yesterday requiring intubation. Remains afebrile. Periods of apnea with pain medications  Objective: Temp:  [96.8 F (36 C)-99 F (37.2 C)] 97.1 F (36.2 C) (03/09 0800) Pulse Rate:  [80-116] 116 (03/09 0825) Resp:  [11-37] 25 (03/09 0825) BP: (74-170)/(45-95) 120/71 mmHg (03/09 0800) SpO2:  [84 %-100 %] 100 % (03/09 0836) FiO2 (%):  [40 %-100 %] 40 % (03/09 0846) Weight:  [127 lb 6.8 oz (57.8 kg)] 127 lb 6.8 oz (57.8 kg) (03/09 0600)  General: intubated Skin: no rashes Lungs: CTAB in anterior lung fields Cor: tachy RR Abdomen: soft, nt, nd Ext: wound on left leg, and hip covered   Lab Results Lab Results  Component Value Date   WBC 24.6* 12/15/2014   HGB 7.9* 12/15/2014   HCT 24.1* 12/15/2014   MCV 82.3 12/15/2014   PLT 499* 12/15/2014    Lab Results  Component Value Date   CREATININE 0.95 12/15/2014   BUN 21 12/15/2014   NA 139 12/15/2014   K 3.4* 12/15/2014   CL 114* 12/15/2014   CO2 19 12/15/2014    Lab Results  Component Value Date   ALT 13 12/12/2014   AST 16 12/12/2014   ALKPHOS 83 12/12/2014   BILITOT 0.7 12/12/2014      Microbiology: Recent Results (from the past 240 hour(s))  Surgical pcr screen     Status: None   Collection Time: 12/08/14  8:39 AM  Result Value Ref Range Status   MRSA, PCR NEGATIVE NEGATIVE Final   Staphylococcus aureus NEGATIVE NEGATIVE Final    Comment:        The Xpert SA Assay (FDA approved for NASAL specimens in patients over 76 years of age), is one component of a comprehensive surveillance program.  Test performance has been validated by [facility] for patients greater than or equal to 85 year old. It is not intended to diagnose infection nor to guide or monitor treatment.   Culture, blood (routine x 2)     Status: None (Preliminary result)    Collection Time: 12/10/14  4:55 PM  Result Value Ref Range Status   Specimen Description Blood  Final   Special Requests NONE  Final   Culture   Final           BLOOD CULTURE RECEIVED NO GROWTH TO DATE CULTURE WILL BE HELD FOR 5 DAYS BEFORE ISSUING A FINAL NEGATIVE REPORT Performed at [facility]    Report Status PENDING  Incomplete  Culture, blood (routine x 2)     Status: None (Preliminary result)   Collection Time: 12/10/14  5:00 PM  Result Value Ref Range Status   Specimen Description BLOOD LEFT ARM  Final   Special Requests BOTTLES DRAWN AEROBIC ONLY 1 CC  Final   Culture   Final           BLOOD CULTURE RECEIVED NO GROWTH TO DATE CULTURE WILL BE HELD FOR 5 DAYS BEFORE ISSUING A FINAL NEGATIVE REPORT Performed at [facility]    Report Status PENDING  Incomplete  Urine culture     Status: None   Collection Time: 12/10/14  6:11 PM  Result Value Ref Range Status   Specimen Description URINE, CATHETERIZED  Final   Special Requests NONE  Final   Colony Count NO GROWTH Performed at [facility]   Final  Culture NO GROWTH Performed at [facility]   Final   Report Status 12/12/2014 FINAL  Final  Clostridium Difficile by PCR     Status: None   Collection Time: 12/12/14  4:22 AM  Result Value Ref Range Status   C difficile by pcr NEGATIVE NEGATIVE Final  MRSA PCR Screening     Status: None   Collection Time: 12/12/14  3:27 PM  Result Value Ref Range Status   MRSA by PCR NEGATIVE NEGATIVE Final    Comment:        The GeneXpert MRSA Assay (FDA approved for NASAL specimens only), is one component of a comprehensive MRSA colonization surveillance program. It is not intended to diagnose MRSA infection nor to guide or monitor treatment for MRSA infections.     Studies/Results: Dg Chest Port 1 View  12/15/2014   CLINICAL DATA:  Respiratory failure  EXAM: PORTABLE CHEST - 1 VIEW  COMPARISON:  12/14/2014  FINDINGS: The cardiac shadow is  stable. Bibasilar infiltrates are again identified and stable. A right-sided PICC line, endotracheal tube and nasogastric catheter are all again seen and stable. No acute abnormality is noted.  IMPRESSION: Stable bilateral lower lobe infiltrates.   Electronically Signed   By: Inez Catalina M.D.   On: 12/15/2014 07:35   Dg Chest Port 1 View  12/14/2014   CLINICAL DATA:  29 year old male intubated. Respiratory distress. Initial encounter.  EXAM: PORTABLE CHEST - 1 VIEW  COMPARISON:  12/14/2014 and earlier.  FINDINGS: Portable AP supine view at 1239 hrs. Endotracheal tube tip in good position between the level the clavicles and carina. Enteric tube placed in courses to the left upper quadrant, tip not included. Stable right PICC line.  Stable lung volumes. Bilateral lower lobe airspace disease appears not significantly changed. No pneumothorax, large effusion or edema. Stable cardiac size and mediastinal contours.  IMPRESSION: 1. Endotracheal tube and enteric tube appear probe early place. 2. Bilateral lower lobe pneumonia not significantly changed.   Electronically Signed   By: Genevie Ann M.D.   On: 12/14/2014 13:20   Dg Chest Port 1 View  12/14/2014   CLINICAL DATA:  Sudden onset respiratory distress. Decreased oxygen saturation.  EXAM: PORTABLE CHEST - 1 VIEW  COMPARISON:  CT 12/11/2014  FINDINGS: Right upper extremity PICC line tip projects over the superior vena cava. Monitoring leads overlie the patient. Stable enlarged cardiac and mediastinal contours. Heterogeneous opacities right lung base, increased. Persistent minimal heterogeneous opacities left lung base. Small right pleural effusion. No definite pneumothorax.  IMPRESSION: Increased right lung base heterogeneous opacities concerning for pneumonia. There is an adjacent small pleural effusion.  Persistent heterogeneous opacities left lung base concerning for infection. Probable small left pleural effusion.   Electronically Signed   By: Lovey Newcomer M.D.   On:  12/14/2014 12:37    Assessment/Plan:  1) fevers with leukocytosis = recent decompensation could be due to new pneumonia ? Aspiration vs. Pyelonephritis.  Favoring pneumonia. 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  3) respiratory distress requiring vent support = being managed by PCCM.  4) tachycardia = possibly due to pain. Will continue to monitor  Carlyle Basques, [place] for Infectious Disease [place] [email or web address] [phone] pager   [phone] cell 12/15/2014, 10:52 AM

## 2014-12-15 NOTE — Progress Notes (Signed)
Inpatient Diabetes Program Recommendations  AACE/ADA: New Consensus Statement on Inpatient Glycemic Control (2013)  Target Ranges:  Prepandial:   less than 140 mg/dL      Peak postprandial:   less than 180 mg/dL (1-2 hours)      Critically ill patients:  140 - 180 mg/dL   Reason for Assessment:  Results for SANTO, ZAHRADNIK (MRN [id number]) as of 12/15/2014 10:10  Ref. Range 12/14/2014 17:18 12/14/2014 19:29 12/14/2014 23:42 12/15/2014 04:01 12/15/2014 07:44  Glucose-Capillary Latest Range: 70-99 mg/dL 178 (H) 170 (H) 157 (H) 196 (H) 180 (H)    Note events of 12/14/14.  Please increase frequency of Novolog correction to q 4 hours.  If tube feeds are started, will likely need more insulin.  Will follow.  Thanks, Adah Perl, RN, BC-ADM Inpatient Diabetes Coordinator Pager [phone]

## 2014-12-15 NOTE — Progress Notes (Signed)
PULMONARY / CRITICAL CARE MEDICINE   Name: Jason [name] MRN: [id number] DOB: Apr 18, 1986    ADMISSION DATE:  12/01/2014 CONSULTATION DATE:  12/12/2014  REFERRING MD :  Erlinda Hong  CHIEF COMPLAINT:  Confusion  INITIAL PRESENTATION: 29 year old male with a history of spinal cord infarct and paraplegia who has had multiple recent infections was hospitalized on 12/01/2014 for hallucinations believed to be related to meropenem. His mental status improved but he developed worsening sepsis and recurrent altered mental status at pulmonary and critical care medicine was consulted on 12/12/2014.  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  Comfortable on vent. Apnea with SBT / WUA (was on fentanyl gtt)  VITAL SIGNS: Temp:  [96.8 F (36 C)-99 F (37.2 C)] 97.2 F (36.2 C) (03/09 1100) Pulse Rate:  [80-116] 116 (03/09 0825) Resp:  [12-37] 25 (03/09 0825) BP: (74-170)/(45-95) 120/71 mmHg (03/09 0800) SpO2:  [84 %-100 %] 100 % (03/09 0836) FiO2 (%):  [40 %-100 %] 40 % (03/09 1119) Weight:  [127 lb 6.8 oz (57.8 kg)] 127 lb 6.8 oz (57.8 kg) (03/09 0600)   HEMODYNAMICS:     VENTILATOR SETTINGS: Vent Mode:  [-] PRVC FiO2 (%):  [40 %-100 %] 40 % Set Rate:  [16 bmp] 16 bmp Vt Set:  [500 mL] 500 mL PEEP:  [5 cmH20] 5 cmH20 Pressure Support:  [12 cmH20] 12 cmH20 Plateau Pressure:  [10 cmH20-26 cmH20] 16 cmH20   INTAKE / OUTPUT:  Intake/Output Summary (Last 24 hours) at 12/15/14 1132 Last data filed at 12/15/14 0800  Gross per 24 hour   Intake 2003.5 ml  Output    650 ml  Net 1353.5 ml    PHYSICAL EXAMINATION: General:  Chronically ill young adult Neuro: opens eyes to voice, follows commands (sticks tongue out), can move arms bilaterally but wrist and hands are in contracture, legs in boots, no lower extremity movement HEENT:  NCAT Cardiovascular:  s1s2 rrr, no m/r/g Lungs:  Even/non-labored, lungs bilaterally clear with good air movement Abdomen:  NTND, bsx4 active  Musculoskeletal:  Atrophy noted throughout Skin:  Unable to examine sacral wounds, dressings c/d/i  LABS:  CBC  Recent Labs Lab 12/13/14 0345 12/14/14 0415 12/15/14 0607  WBC 32.0* 26.9* 24.6*  HGB 7.1* 6.4* 7.9*  HCT 22.2* 19.7* 24.1*  PLT 527* 478* 499*   Coag's No results for input(s): APTT, INR in the last 168 hours.   BMET  Recent Labs Lab 12/13/14 0345 12/14/14 0415 12/15/14 0607  NA 137 136 139  K 3.7 3.3* 3.4*  CL 112 112 114*  CO2 16* 22 19  BUN 15 19 21   CREATININE 1.08 1.35 0.95  GLUCOSE 358* 319* 194*   Electrolytes  Recent Labs Lab 12/13/14 0345 12/14/14 0415 12/15/14 0607  CALCIUM 9.6 9.4 9.8  MG  --  1.4*  --   PHOS  --  3.1  --    Sepsis Markers  Recent Labs Lab 12/12/14 1400  LATICACIDVEN 0.9   ABG  Recent Labs Lab 12/12/14 1554 12/14/14 1205 12/14/14 1414  PHART 7.310* 7.207* 7.308*  PCO2ART 36.3 46.7*  38.6  PO2ART 60.0* 53.0* 496.0*   Liver Enzymes  Recent Labs Lab 12/12/14 0458  AST 16  ALT 13  ALKPHOS 83  BILITOT 0.7  ALBUMIN 2.1*   Cardiac Enzymes No results for input(s): TROPONINI, PROBNP in the last 168 hours.   Glucose  Recent Labs Lab 12/14/14 1115 12/14/14 1718 12/14/14 1929 12/14/14 2342 12/15/14 0401 12/15/14 0744  GLUCAP 198* 178* 170* 157* 196* 180*   Imaging Dg Chest Port 1 View  12/14/2014   CLINICAL DATA:  29 year old male intubated. Respiratory distress. Initial encounter.  EXAM: PORTABLE CHEST - 1 VIEW  COMPARISON:  12/14/2014 and earlier.   FINDINGS: Portable AP supine view at 1239 hrs. Endotracheal tube tip in good position between the level the clavicles and carina. Enteric tube placed in courses to the left upper quadrant, tip not included. Stable right PICC line.  Stable lung volumes. Bilateral lower lobe airspace disease appears not significantly changed. No pneumothorax, large effusion or edema. Stable cardiac size and mediastinal contours.  IMPRESSION: 1. Endotracheal tube and enteric tube appear probe early place. 2. Bilateral lower lobe pneumonia not significantly changed.   Electronically Signed   By: Genevie Ann M.D.   On: 12/14/2014 13:20   Dg Chest Port 1 View  12/14/2014   CLINICAL DATA:  Sudden onset respiratory distress. Decreased oxygen saturation.  EXAM: PORTABLE CHEST - 1 VIEW  COMPARISON:  CT 12/11/2014  FINDINGS: Right upper extremity PICC line tip projects over the superior vena cava. Monitoring leads overlie the patient. Stable enlarged cardiac and mediastinal contours. Heterogeneous opacities right lung base, increased. Persistent minimal heterogeneous opacities left lung base. Small right pleural effusion. No definite pneumothorax.  IMPRESSION: Increased right lung base heterogeneous opacities concerning for pneumonia. There is an adjacent small pleural effusion.  Persistent heterogeneous opacities left lung base concerning for infection. Probable small left pleural effusion.   Electronically Signed   By: Lovey Newcomer M.D.   On: 12/14/2014 12:37    ASSESSMENT / PLAN:  PULMONARY A:  Acute Respiratory Failure - concern for new RLL infiltrate / possible aspiration in setting of sepsis  Healthcare Associated Pneumonia P:   Trend CXR  Wean PEEP / FiO2 for saturations > 90% May need trach given recurrent critical illness and paraplegia.  Would wait to discuss with patient until he is off fentanyl.  Daily SBT / WUA   CARDIOVASCULAR PICC line February 12 >>  A:  Sepsis without shock Chronic Hypotension - on midodrine   P:  Telemetry monitoring. KVO IVF. Continue midodrine   RENAL A:   Chronic suprapubic catheter Hypokalemia  Hyperchloremia  P:   Urinalysis WBC TNT with positive nitrites.  Yeast and bacteria present. Monitor BMET and UOP. Replace electrolytes as needed. KCL 40 mEq x1  GASTROINTESTINAL A:   Aspiration Risk Protein Calorie Malnutrition  P:   NPO OGT TF per nutrition  Will need swallow evaluation once extubated  HEMATOLOGIC A:   Leukocytosis - in setting of severe sepsis  Chronic anemia - without evidence of bleeding.  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 difficile >> negative x3 3/04 blood culture> negative 2/24 blood culture> negative 2/25 urine culture> no growth  ID to  see  Antibiotics per ID  Continue vancomycin and azactam 3/6>>> Agree with addition of gram-negative coverage for (HCAP)  ENDOCRINE A:   DM2 P:  SSI, CBG Q4 while intubated Reduce lantus to 5 units QHS after intubation (was on 18 units)   NEUROLOGIC A:   Acute encephalopathy > in setting of sepsis Recent hallucinations felt to be due to carbapenem P:   Continue current regimen Fentanyl gtt for pain  Continue fentanyl patch for now   FAMILY  - Updates:  Mother & Father updated at length regarding clinical status.     Noe Gens, NP-C [facility] Pgr: [phone] or [phone]  Intubated overnight, very weak cough, PNA, spoke with patient's parents and patient regarding need for trach.  They are currently contemplating the idea.  Will follow.  Continue abx for now, follow up on cultures.  Will discuss again in AM.  The patient is critically ill with multiple organ systems failure and  requires high complexity decision making for assessment and support, frequent evaluation and titration of therapies, application of advanced monitoring technologies and extensive interpretation of multiple databases.   Critical Care Time devoted to patient care services described in this note is  35  Minutes. This time reflects time of care of this signee Dr Jennet Maduro. This critical care time does not reflect procedure time, or teaching time or supervisory time of PA/NP/Med student/Med Resident etc but could involve care discussion time.  Rush Farmer, M.D. [facility]Critical [facility] Medicine. Pager: [phone]. After hours pager: [phone].  12/15/2014, 11:32 AM

## 2014-12-15 NOTE — Progress Notes (Signed)
Dr. Alva Garnet notified [facility] of 323 at 2150, orders received, will continue to monitor.

## 2014-12-15 NOTE — Progress Notes (Signed)
Called and spoke with [facility] nurse Rosann Auerbach to inform CCM team of change in status. After speaking to Sallye Ober NP at 1430; increased temp monitoring to hourly, and patient's temp increased to 102.6; PRN tylenol given per order; UOP still 29ml/hr; Blood pressure 111/71, pulse 133, temperature 102.6 F (39.2 C), temperature source Oral, resp. rate 18, height 5\' 8"  (1.727 m), weight 57.8 kg (127 lb 6.8 oz), SpO2 100 %.

## 2014-12-15 NOTE — Progress Notes (Signed)
NUTRITION FOLLOW UP  Pt meets criteria for SEVERE MALNUTRITION in the context of chronic illness as evidenced by a 9.6% weight loss in 2 months and severe muscle mass loss.  DOCUMENTATION CODES Per approved criteria  -Severe Malnutrition in the context of chronic illness   INTERVENTION: Discontinue Vital High Protein and oral supplements.  Initiate Vital AF 1.2 via OGT at 25 ml and increase by 10 ml every 4 hours to goal rate of 55 ml/hr to provide 1584 kcal (provides 99% of estimated kcal needs), 99 grams of protein (100% of protein needs), and 1069 ml of free water.  Will continue to monitor.  NUTRITION DIAGNOSIS: Increased nutrient needs related to wound healing as evidenced by estimated nutrition needs; ongoing  Goal: Pt to meet >/= 90% of their estimated nutrition needs; progressing  Monitor:  Vent status, TF tolerance, weight trends, labs, I/O's  29 y.o. 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  Patient is currently intubated on ventilator support MV: 9.2 L/min Temp (24hrs), Avg:97.5 F (36.4 C), Min:96.8 F (36 C), Max:99 F (37.2 C)  Propofol: none  Pt developed severe sepsis with worsening O2 requirements, intubated 3/8. RD consulted for enteral/tube feeding initiation and management. Pt currently has Vital High Protein infusing at 40 ml/hr which provides 960 kcal provides (60% of estimated kcal needs), 84 grams of protein (provides 93% of estimated protein needs), and 806 ml of free water. Per RN, pt has been tolerating his tube feedings fine. RD to switch tube feeding formula to better meet nutrition needs.  Labs: Low potassium. High chloride.  Height: Ht Readings from Last 1 Encounters:  12/02/14 5\' 8"   (1.727 m)    Weight: Wt Readings from Last 1 Encounters:  12/15/14 127 lb 6.8 oz (57.8 kg)    BMI:  Body mass index is 19.38 kg/(m^2).   Re-Estimated Nutritional Needs: Kcal: 1606 Protein: 90-110 grams Fluid: >/= 1.5 L/day  Skin: Stage II pressure ulcer on finger, left buttocks, Stage III on L heel, incision on coccyx, back, sacrum, +1 LE edema  Diet Order: Diet - low sodium heart healthy   Intake/Output Summary (Last 24 hours) at 12/15/14 0907 Last data filed at 12/15/14 0800  Gross per 24 hour  Intake 2303.5 ml  Output    700 ml  Net 1603.5 ml    Last BM: 3/8-ostomy  Labs:   Recent Labs Lab 12/13/14 0345 12/14/14 0415 12/15/14 0607  NA 137 136 139  K 3.7 3.3* 3.4*  CL 112 112 114*  CO2 16* 22 19  BUN 15 19 21   CREATININE 1.08 1.35 0.95  CALCIUM 9.6 9.4 9.8  MG  --  1.4*  --   PHOS  --  3.1  --   GLUCOSE 358* 319* 194*    CBG (last 3)   Recent Labs  12/14/14 2342 12/15/14 0401 12/15/14 0744  GLUCAP 157* 196* 180*    Scheduled Meds: . sodium chloride   Intravenous Once  . antiseptic oral rinse  7 mL Mouth Rinse q12n4p  . atorvastatin  20 mg Oral Daily  . aztreonam  1 g Intravenous 3 times per day  . baclofen  10 mg Oral BID WC  . baclofen  20 mg Oral QHS  . chlorhexidine  15 mL Mouth Rinse BID  .  feeding supplement (GLUCERNA SHAKE)  237 mL Oral TID BM  . feeding supplement (PRO-STAT SUGAR FREE 64)  30 mL Oral BID  . feeding supplement (VITAL HIGH PROTEIN)  1,000 mL Per Tube Q24H  . fentaNYL  25 mcg Transdermal Q72H  . guaiFENesin  600 mg Oral BID  . heparin  5,000 Units Subcutaneous 3 times per day  . insulin aspart  0-9 Units Subcutaneous TID WC  . insulin glargine  5 Units Subcutaneous QHS  . ipratropium  0.5 mg Nebulization Q6H  . lactobacillus  1 g Oral TID WC  . levalbuterol  1.25 mg Nebulization 4 times per day  . lidocaine  1 application Topical Once per day on Mon Wed Fri  . liver oil-zinc oxide   Topical BID  . metronidazole   500 mg Intravenous Q8H  . mirtazapine  15 mg Oral QHS  . mupirocin ointment  1 application Nasal Once per day on Mon Wed Fri  . pantoprazole  40 mg Oral Daily  . pregabalin  100 mg Oral TID AC  . pregabalin  200 mg Oral QHS  . sodium chloride  3 mL Intravenous Q12H  . vancomycin  1,500 mg Intravenous Q24H    Continuous Infusions: . fentaNYL infusion INTRAVENOUS 100 mcg/hr (12/15/14 0825)  . lactated ringers 10 mL/hr at 12/15/14 3295    Past Medical History  Diagnosis Date  . GERD (gastroesophageal reflux disease)   . Asthma   . Hx MRSA infection     on face  . Gastroparesis   . Diabetic neuropathy   . Seizures   . Family history of anesthesia complication     Pt mother can't have epidural procedures  . Dysrhythmia   . Pneumonia   . Arthritis   . Fibromyalgia   . 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 Type I diabetes mellitus     sees Dr. Loanne Drilling     Past Surgical History  Procedure Laterality Date  . Tonsillectomy    . Multiple extractions with alveoloplasty N/A 08/03/2014    Procedure: MULTIPLE EXTRACTIONS;  Surgeon: Gae Bon, DDS;  Location: [place];  Service: Oral Surgery;  Laterality: N/A;  . Tee without cardioversion N/A 08/17/2014    Procedure: TRANSESOPHAGEAL ECHOCARDIOGRAM (TEE);  Surgeon: Dorothy Spark, MD;  Location: [facility] ENDOSCOPY;  Service: Cardiovascular;  Laterality: N/A;  . Debridment of decubitus ulcer N/A 10/04/2014    Procedure: DEBRIDMENT OF DECUBITUS ULCER;  Surgeon: Georganna Skeans, MD;  Location: [place];  Service: General;  Laterality: N/A;  . Laparoscopic diverted colostomy N/A 10/12/2014    Procedure: LAPAROSCOPIC DIVERTING COLOSTOMY;  Surgeon: Donnie Mesa, MD;  Location: [place];  Service: General;  Laterality: N/A;  . Insertion of suprapubic catheter N/A 10/12/2014    Procedure: INSERTION OF SUPRAPUBIC CATHETER;  Surgeon: Reece Packer, MD;  Location: [place];  Service: Urology;  Laterality: N/A;  . Incision and  drainage of wound N/A 11/12/2014    Procedure: IRRIGATION AND DEBRIDEMENT OF WOUNDS WITH BONE BIOPSY AND SURGICAL PREP ;  Surgeon: Theodoro Kos, DO;  Location: [place];  Service: Plastics;  Laterality: N/A;  . Application of a-cell of back N/A 11/12/2014    Procedure: APPLICATION A CELL AND VAC ;  Surgeon: Theodoro Kos, DO;  Location: [place];  Service: Plastics;  Laterality: N/A;  . Incision and drainage of wound N/A 11/18/2014    Procedure: IRRIGATION AND DEBRIDEMENT OF SACRAL ULCER ONLY WITH PLACEMENT OF A CELL  AND VAC/ DRESSING CHANGE TO UPPER BACK AREA.;  Surgeon: Theodoro Kos, DO;  Location: [place];  Service: Plastics;  Laterality: N/A;  . Incision and drainage of wound N/A 11/25/2014    Procedure: IRRIGATION AND DEBRIDEMENT OF SACRAL ULCER AND BACK BURN WITH PLACEMENT OF A-CELL;  Surgeon: Theodoro Kos, DO;  Location: [place];  Service: Plastics;  Laterality: N/A;  . Minor application of wound vac N/A 11/25/2014    Procedure:  WOUND VAC CHANGE;  Surgeon: Theodoro Kos, DO;  Location: [place];  Service: Plastics;  Laterality: N/A;  . Incision and drainage of wound Right 12/08/2014    Procedure: IRRIGATION AND DEBRIDEMENT SACRAL WOUND AND RIGHT ISCHIAL WOUND ;  Surgeon: Theodoro Kos, DO;  Location: [place];  Service: Plastics;  Laterality: Right;  . Application of a-cell of back N/A 12/08/2014    Procedure: APPLICATION OF A-CELL AND WOUND VAC ;  Surgeon: Theodoro Kos, DO;  Location: [place];  Service: Plastics;  Laterality: N/A;    Kallie Locks, MS, RD, LDN Pager # [phone] After hours/ weekend pager # [phone]

## 2014-12-15 NOTE — Progress Notes (Signed)
Paged Sallye Ober; Returned page at 1435 - notified that patients heart rate was still increasing; up to 130's at this time;Temp was 98.9; BP 123/74; told to keep monitoring patient and notify of any other changes.

## 2014-12-15 NOTE — Progress Notes (Signed)
ANTIBIOTIC CONSULT NOTE - FOLLOW UP  Pharmacy Consult for vancomycin, azactam Indication: sepsis/HCAP  Allergies  Allergen Reactions  . Cefuroxime Axetil Anaphylaxis  . Ertapenem Other (See Comments)    Rash and confusion  . Morphine And Related Other (See Comments)    Changed mental status, confusion, headache, visual hallucination  . Penicillins Anaphylaxis and Other (See Comments)    ?can take amoxicillin?  . Sulfa Antibiotics Anaphylaxis, Shortness Of Breath and Other (See Comments)  . Tessalon [Benzonatate] Anaphylaxis  . Shellfish Allergy Itching and Other (See Comments)    Took benadryl to alleviate reaction    Patient Measurements: Height: 5\' 8"  (172.7 cm) (from 11/08/14) Weight: 127 lb 6.8 oz (57.8 kg) IBW/kg (Calculated) : 68.4  Vital Signs: Temp: 96 F (35.6 C) (03/09 1500) Temp Source: Axillary (03/09 1500) BP: 127/74 mmHg (03/09 1400) Pulse Rate: 128 (03/09 1400) Intake/Output from previous day: 03/08 0701 - 03/09 0700 In: 2331.8 [I.V.:322.8; Blood:275; NG/GT:784; IV Piggyback:950] Out: 750 [Urine:750] Intake/Output from this shift: Total I/O In: 1233.3 [I.V.:258.3; NG/GT:325; IV Piggyback:650] Out: 400 [Urine:200; Stool:200]  Labs:  Recent Labs  12/13/14 0345 12/14/14 0415 12/15/14 0607  WBC 32.0* 26.9* 24.6*  HGB 7.1* 6.4* 7.9*  PLT 527* 478* 499*  CREATININE 1.08 1.35 0.95   Estimated Creatinine Clearance: 93 mL/min (by C-G formula based on Cr of 0.95). No results for input(s): VANCOTROUGH, VANCOPEAK, VANCORANDOM, GENTTROUGH, GENTPEAK, GENTRANDOM, TOBRATROUGH, TOBRAPEAK, TOBRARND, AMIKACINPEAK, AMIKACINTROU, AMIKACIN in the last 72 hours.   Anti-infectives    Start     Dose/Rate Route Frequency Ordered Stop   12/13/14 1500  metroNIDAZOLE (FLAGYL) IVPB 500 mg     500 mg 100 mL/hr over 60 Minutes Intravenous Every 8 hours 12/13/14 1446     12/12/14 1400  aztreonam (AZACTAM) 1 g in dextrose 5 % 50 mL IVPB     1 g 100 mL/hr over 30 Minutes  Intravenous 3 times per day 12/12/14 1258     12/11/14 1830  fluconazole (DIFLUCAN) IVPB 200 mg     200 mg 100 mL/hr over 60 Minutes Intravenous  Once 12/11/14 1747 12/11/14 1949   12/10/14 0000  levofloxacin (LEVAQUIN) 500 MG/100ML SOLN  Status:  Discontinued     over 60 Minutes   12/10/14 1403 12/10/14    12/10/14 0000  metroNIDAZOLE (FLAGYL) 5-0.79 MG/ML-% IVPB  Status:  Discontinued     100 mL/hr over 60 Minutes   12/10/14 1403 12/10/14    12/10/14 0000  vancomycin 1,500 mg in sodium chloride 0.9 % 500 mL     1,500 mg 250 mL/hr over 120 Minutes Intravenous Every 24 hours 12/10/14 1403 12/31/14 2359   12/10/14 0000  levofloxacin (LEVAQUIN) 500 MG/100ML SOLN  Status:  Discontinued     over 60 Minutes   12/10/14 1545 12/10/14    12/10/14 0000  levofloxacin (LEVAQUIN) 500 MG/100ML SOLN     500 mg 100 mL/hr over 60 Minutes Intravenous Every 24 hours 12/10/14 1654     12/10/14 0000  metroNIDAZOLE (FLAGYL) 5-0.79 MG/ML-% IVPB     500 mg 100 mL/hr over 60 Minutes Intravenous Every 8 hours 12/10/14 1654     12/09/14 1600  metroNIDAZOLE (FLAGYL) IVPB 500 mg  Status:  Discontinued     500 mg 100 mL/hr over 60 Minutes Intravenous Every 8 hours 12/09/14 1415 12/12/14 1118   12/08/14 1627  polymyxin B 500,000 Units, bacitracin 50,000 Units in sodium chloride irrigation 0.9 % 500 mL irrigation  Status:  Discontinued  As needed 12/08/14 1628 12/08/14 1702   12/08/14 1430  vancomycin (VANCOCIN) 1,500 mg in sodium chloride 0.9 % 500 mL IVPB  Status:  Discontinued     1,500 mg 250 mL/hr over 120 Minutes Intravenous Every 24 hours 12/07/14 1405 12/07/14 1406   12/08/14 0600  ciprofloxacin (CIPRO) IVPB 400 mg  Status:  Discontinued     400 mg 200 mL/hr over 60 Minutes Intravenous On call to O.R. 12/07/14 1256 12/07/14 1259   12/07/14 1430  vancomycin (VANCOCIN) 1,500 mg in sodium chloride 0.9 % 500 mL IVPB     1,500 mg 250 mL/hr over 120 Minutes Intravenous Every 24 hours 12/07/14 1406      12/04/14 1300  levofloxacin (LEVAQUIN) IVPB 500 mg  Status:  Discontinued     500 mg 100 mL/hr over 60 Minutes Intravenous Every 24 hours 12/04/14 1108 12/12/14 1118   12/04/14 1200  metroNIDAZOLE (FLAGYL) IVPB 500 mg  Status:  Discontinued     500 mg 100 mL/hr over 60 Minutes Intravenous Every 8 hours 12/04/14 1108 12/09/14 1415   12/03/14 0800  vancomycin (VANCOCIN) 1,250 mg in sodium chloride 0.9 % 250 mL IVPB  Status:  Discontinued     1,250 mg 166.7 mL/hr over 90 Minutes Intravenous Every 24 hours 12/02/14 1158 12/07/14 1405   12/02/14 1130  vancomycin (VANCOCIN) IVPB 1000 mg/200 mL premix  Status:  Discontinued     1,000 mg 200 mL/hr over 60 Minutes Intravenous Every 24 hours 12/02/14 0119 12/02/14 1155   12/02/14 0300  meropenem (MERREM) 1 g in sodium chloride 0.9 % 100 mL IVPB  Status:  Discontinued     1 g 200 mL/hr over 30 Minutes Intravenous 3 times per day 12/02/14 8832 12/04/14 1108      Assessment: 29 yo male with fever and concern for sepsis from pyelonephritis vs HCAP on vancomycin and levaquin.  He he noted with recent fever and WBC= 30.5.  ID is also following. Pharmacy has been consulted for change to azactam. SCr= 0.85 and CrCl ~ 100.  Vancomycin trough is at goal (VT= 19.6) on vancomycin 1500mg  IV q24h.   Vanc PTA >> (initally planning 6 weeks from 2/5) LVQ 2/27 >> 3/6 Flagyl 2/27 >> 3/6 Merrem 2/25 >> 2/27 Aztreonam 3/6 >> Fluc x 1 3/5  2/25 UCx - neg FINAL 2/24 BCx x2 - neg FINAL 3/4 BCx x 2 ngtd 3/4 urine neg 3/6 cdiff neg  Goal of Therapy:  Vancomycin trough level 15-20 mcg/ml  Plan:  Start Aztreonam 1gm IV q8h Continue Vancomycin 1500mg  IV q24h Flagyl 500/8h Watch renal fx, cultures, duration of therapy Check free T3/T4 ? Check VT Fri  Hughes Better, PharmD, BCPS Clinical Pharmacist Pager: [phone] 12/15/2014 3:37 PM

## 2014-12-16 ENCOUNTER — Inpatient Hospital Stay (HOSPITAL_COMMUNITY): Payer: Medicaid Other

## 2014-12-16 LAB — CBC
HCT: 22.7 % — ABNORMAL LOW (ref 39.0–52.0)
Hemoglobin: 7.7 g/dL — ABNORMAL LOW (ref 13.0–17.0)
MCH: 27.7 pg (ref 26.0–34.0)
MCHC: 33.9 g/dL (ref 30.0–36.0)
MCV: 81.7 fL (ref 78.0–100.0)
PLATELETS: 497 10*3/uL — AB (ref 150–400)
RBC: 2.78 MIL/uL — ABNORMAL LOW (ref 4.22–5.81)
RDW: 17.2 % — ABNORMAL HIGH (ref 11.5–15.5)
WBC: 21 10*3/uL — ABNORMAL HIGH (ref 4.0–10.5)

## 2014-12-16 LAB — GLUCOSE, CAPILLARY
GLUCOSE-CAPILLARY: 325 mg/dL — AB (ref 70–99)
GLUCOSE-CAPILLARY: 230 mg/dL — AB (ref 70–99)
Glucose-Capillary: 254 mg/dL — ABNORMAL HIGH (ref 70–99)
Glucose-Capillary: 194 mg/dL — ABNORMAL HIGH (ref 70–99)
Glucose-Capillary: 270 mg/dL — ABNORMAL HIGH (ref 70–99)
Glucose-Capillary: 296 mg/dL — ABNORMAL HIGH (ref 70–99)

## 2014-12-16 LAB — BASIC METABOLIC PANEL
Anion gap: 7 (ref 5–15)
BUN: 28 mg/dL — ABNORMAL HIGH (ref 6–23)
CHLORIDE: 115 mmol/L — AB (ref 96–112)
CO2: 16 mmol/L — ABNORMAL LOW (ref 19–32)
CREATININE: 1.04 mg/dL (ref 0.50–1.35)
Calcium: 8.9 mg/dL (ref 8.4–10.5)
GLUCOSE: 316 mg/dL — AB (ref 70–99)
POTASSIUM: 3.5 mmol/L (ref 3.5–5.1)
Sodium: 138 mmol/L (ref 135–145)

## 2014-12-16 MED ORDER — POTASSIUM CHLORIDE 20 MEQ/15ML (10%) PO SOLN
40.0000 meq | Freq: Three times a day (TID) | ORAL | Status: AC
  Administered 2014-12-16 (×2): 40 meq
  Filled 2014-12-16 (×2): qty 30

## 2014-12-16 MED ORDER — FUROSEMIDE 10 MG/ML IJ SOLN
40.0000 mg | Freq: Once | INTRAMUSCULAR | Status: AC
  Administered 2014-12-16: 40 mg via INTRAVENOUS
  Filled 2014-12-16: qty 4

## 2014-12-16 MED ORDER — PANTOPRAZOLE SODIUM 40 MG PO PACK
40.0000 mg | PACK | Freq: Every day | ORAL | Status: DC
  Administered 2014-12-16 – 2014-12-18 (×3): 40 mg
  Filled 2014-12-16 (×3): qty 20

## 2014-12-16 NOTE — Progress Notes (Signed)
PULMONARY / CRITICAL CARE MEDICINE   Name: Jason [name] MRN: [id number] DOB: 04/16/1986    ADMISSION DATE:  12/01/2014 CONSULTATION DATE:  12/12/2014  REFERRING MD :  Erlinda Hong  CHIEF COMPLAINT:  Confusion  INITIAL PRESENTATION: 29 year old male with a history of spinal cord infarct and paraplegia who has had multiple recent infections was hospitalized on 12/01/2014 for hallucinations believed to be related to meropenem. His mental status improved but he developed worsening sepsis and recurrent altered mental status at pulmonary and critical care medicine was consulted on 12/12/2014.  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  Comfortable on vent. Apnea with SBT / WUA (was on fentanyl gtt)  VITAL SIGNS: Temp:  [96.5 F (35.8 C)-104.7 F (40.4 C)] 96.5 F (35.8 C) (03/10 0700) Pulse Rate:  [26-133] 90 (03/10 1000) Resp:  [13-23] 16 (03/10 1000) BP: (108-135)/(63-84) 124/69 mmHg (03/10 1000) SpO2:  [97 %-100 %] 97 % (03/10 1000) FiO2 (%):  [40 %] 40 % (03/10 0807) Weight:  [58.9 kg (129 lb 13.6 oz)] 58.9 kg (129 lb 13.6 oz) (03/10 0500)   HEMODYNAMICS:     VENTILATOR SETTINGS: Vent Mode:  [-] PRVC FiO2 (%):  [40 %] 40 % Set Rate:  [16 bmp] 16 bmp Vt Set:  [500 mL] 500 mL PEEP:  [5 cmH20] 5 cmH20 Plateau Pressure:  [15 cmH20-18 cmH20] 17 cmH20   INTAKE / OUTPUT:  Intake/Output Summary (Last 24 hours) at 12/16/14 1013 Last data filed at 12/16/14 1000  Gross per 24 hour  Intake 3651.58 ml  Output   1720 ml  Net  1931.58 ml    PHYSICAL EXAMINATION: General:  Chronically ill young adult Neuro: opens eyes to voice, follows commands (sticks tongue out), can move arms bilaterally but wrist and hands are in contracture, legs in boots, no lower extremity movement HEENT:  NCAT Cardiovascular:  s1s2 rrr, no m/r/g Lungs:  Even/non-labored, lungs bilaterally clear with good air movement Abdomen:  NTND, bsx4 active  Musculoskeletal:  Atrophy noted throughout Skin:  Unable to examine sacral wounds, dressings c/d/i  LABS:  CBC  Recent Labs Lab 12/14/14 0415 12/15/14 0607 12/16/14 0509  WBC 26.9* 24.6* 21.0*  HGB 6.4* 7.9* 7.7*  HCT 19.7* 24.1* 22.7*  PLT 478* 499* 497*   Coag's No results for input(s): APTT, INR in the last 168 hours.   BMET  Recent Labs Lab 12/14/14 0415 12/15/14 0607 12/16/14 0509  NA 136 139 138  K 3.3* 3.4* 3.5  CL 112 114* 115*  CO2 22 19 16*  BUN 19 21 28*  CREATININE 1.35 0.95 1.04  GLUCOSE 319* 194* 316*   Electrolytes  Recent Labs Lab 12/14/14 0415 12/15/14 0607 12/16/14 0509  CALCIUM 9.4 9.8 8.9  MG 1.4*  --   --   PHOS 3.1  --   --    Sepsis Markers  Recent Labs Lab 12/12/14 1400  LATICACIDVEN 0.9   ABG  Recent Labs Lab 12/12/14 1554 12/14/14 1205 12/14/14 1414  PHART 7.310* 7.207* 7.308*  PCO2ART 36.3 46.7* 38.6  PO2ART 60.0* 53.0* 496.0*   Liver Enzymes  Recent Labs Lab 12/12/14 0458  AST 16  ALT 13  ALKPHOS 83  BILITOT 0.7  ALBUMIN 2.1*   Cardiac Enzymes No results for input(s): TROPONINI, PROBNP in the last 168 hours.   Glucose  Recent Labs Lab 12/15/14 1522 12/15/14 1913 12/15/14 2150 12/15/14 2319 12/16/14 0313 12/16/14 0745  GLUCAP 265* 324* 323* 376* 325* 254*   Imaging Dg Chest Port 1 View  12/15/2014   CLINICAL DATA:  Respiratory failure  EXAM: PORTABLE CHEST - 1 VIEW  COMPARISON:  12/14/2014  FINDINGS: The cardiac shadow is stable. Bibasilar infiltrates are again identified and stable. A right-sided  PICC line, endotracheal tube and nasogastric catheter are all again seen and stable. No acute abnormality is noted.  IMPRESSION: Stable bilateral lower lobe infiltrates.   Electronically Signed   By: Inez Catalina M.D.   On: 12/15/2014 07:35    ASSESSMENT / PLAN:  PULMONARY A:  Acute Respiratory Failure - concern for new RLL infiltrate / possible aspiration in setting of sepsis  Healthcare Associated Pneumonia P:   Trend CXR. Wean PEEP / FiO2 for saturations > 90%. PS trials but no extubation given secretions. Daily SBT / WUA.  CARDIOVASCULAR PICC line February 12 >>  A:  Sepsis without shock Chronic Hypotension - on midodrine  P:  Telemetry monitoring. KVO IVF. Continue midodrine   RENAL A:   Chronic suprapubic catheter Hypokalemia  Hyperchloremia  P:   Urinalysis WBC TNT with positive nitrites.  Yeast and bacteria present. Monitor BMET and UOP. Replace electrolytes as needed. Single dose of lasix.  GASTROINTESTINAL A:   Aspiration Risk Protein Calorie Malnutrition  P:   OGT TF per nutrition  Will need swallow evaluation once extubated  HEMATOLOGIC A:   Leukocytosis - in setting of severe sepsis  Chronic anemia - without evidence of bleeding.  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 difficile >> negative x3 3/04 blood culture> negative 2/24 blood culture> negative 2/25 urine culture> no growth  ID to see  Antibiotics per ID  Continue vancomycin and azactam 3/6>>> Agree with addition of gram-negative coverage for (HCAP)  ENDOCRINE A:   DM2 P:  SSI, CBG Q4 while intubated Reduce lantus to 5 units QHS after intubation (was on 18 units)  NEUROLOGIC A:   Acute encephalopathy >  in setting of sepsis Recent hallucinations felt to be due to carbapenem P:   Continue current regimen Fentanyl gtt for pain  Continue fentanyl patch for now  FAMILY  - Updates: Maintain on PS trials but no extubation given sepsis and increased secretion.  Need to continue discussing trach need.    The patient is critically ill with multiple organ systems failure and requires high complexity decision making for assessment and support, frequent evaluation and titration of therapies, application of advanced monitoring technologies and extensive interpretation of multiple databases.   Critical Care Time devoted to patient care services described in this note is  35  Minutes. This time reflects time of care of this signee Dr Jennet Maduro. This critical care time does not reflect procedure time, or teaching time or supervisory time of PA/NP/Med student/Med Resident etc but could involve care discussion time.  Rush Farmer, M.D.  [place]. Pager: [phone]. After hours pager: [phone].  12/16/2014, 10:13 AM

## 2014-12-16 NOTE — Progress Notes (Signed)
OT Cancellation Note  Patient Details Name: Jason [name] MRN: [id number] DOB: 1986-10-02   Cancelled Treatment:    Reason Eval/Treat Not Completed: Medical issues which prohibited therapy (Pt remains intubated, signing off.)  Malka So 12/16/2014, 8:08 AM

## 2014-12-17 ENCOUNTER — Inpatient Hospital Stay (HOSPITAL_COMMUNITY): Payer: Medicaid Other

## 2014-12-17 DIAGNOSIS — J9600 Acute respiratory failure, unspecified whether with hypoxia or hypercapnia: Secondary | ICD-10-CM

## 2014-12-17 DIAGNOSIS — M86159 Other acute osteomyelitis, unspecified femur: Secondary | ICD-10-CM

## 2014-12-17 LAB — CBC
HEMATOCRIT: 21.6 % — AB (ref 39.0–52.0)
Hemoglobin: 6.9 g/dL — CL (ref 13.0–17.0)
MCH: 27.2 pg (ref 26.0–34.0)
MCHC: 31.9 g/dL (ref 30.0–36.0)
MCV: 85 fL (ref 78.0–100.0)
Platelets: 529 10*3/uL — ABNORMAL HIGH (ref 150–400)
RBC: 2.54 MIL/uL — ABNORMAL LOW (ref 4.22–5.81)
RDW: 18 % — AB (ref 11.5–15.5)
WBC: 24.4 10*3/uL — AB (ref 4.0–10.5)

## 2014-12-17 LAB — BLOOD GAS, ARTERIAL
Acid-base deficit: 7.3 mmol/L — ABNORMAL HIGH (ref 0.0–2.0)
Bicarbonate: 18.2 mEq/L — ABNORMAL LOW (ref 20.0–24.0)
Drawn by: 252031
FIO2: 0.4 %
MECHVT: 500 mL
O2 Saturation: 98.4 %
PATIENT TEMPERATURE: 98.6
PCO2 ART: 40.3 mmHg (ref 35.0–45.0)
PEEP: 5 cmH2O
PH ART: 7.278 — AB (ref 7.350–7.450)
PO2 ART: 123 mmHg — AB (ref 80.0–100.0)
RATE: 16 resp/min
TCO2: 19.5 mmol/L (ref 0–100)

## 2014-12-17 LAB — MAGNESIUM: Magnesium: 1.4 mg/dL — ABNORMAL LOW (ref 1.5–2.5)

## 2014-12-17 LAB — CULTURE, BLOOD (ROUTINE X 2)
Culture: NO GROWTH
Culture: NO GROWTH

## 2014-12-17 LAB — GLUCOSE, CAPILLARY
Glucose-Capillary: 256 mg/dL — ABNORMAL HIGH (ref 70–99)
Glucose-Capillary: 241 mg/dL — ABNORMAL HIGH (ref 70–99)
Glucose-Capillary: 209 mg/dL — ABNORMAL HIGH (ref 70–99)
Glucose-Capillary: 183 mg/dL — ABNORMAL HIGH (ref 70–99)
Glucose-Capillary: 201 mg/dL — ABNORMAL HIGH (ref 70–99)

## 2014-12-17 LAB — BASIC METABOLIC PANEL
Anion gap: 7 (ref 5–15)
BUN: 26 mg/dL — ABNORMAL HIGH (ref 6–23)
CHLORIDE: 115 mmol/L — AB (ref 96–112)
CO2: 19 mmol/L (ref 19–32)
CREATININE: 0.95 mg/dL (ref 0.50–1.35)
Calcium: 9.3 mg/dL (ref 8.4–10.5)
GFR calc Af Amer: >90 mL/min (ref >90)
GFR calc non Af Amer: >90 mL/min (ref >90)
GLUCOSE: 282 mg/dL — AB (ref 70–99)
Potassium: 4.7 mmol/L (ref 3.5–5.1)
Sodium: 141 mmol/L (ref 135–145)

## 2014-12-17 LAB — VANCOMYCIN, TROUGH: Vancomycin Tr: 39.8 ug/mL (ref 10.0–20.0)

## 2014-12-17 LAB — PHOSPHORUS: Phosphorus: 2.8 mg/dL (ref 2.3–4.6)

## 2014-12-17 MED ORDER — FUROSEMIDE 10 MG/ML IJ SOLN
20.0000 mg | Freq: Four times a day (QID) | INTRAMUSCULAR | Status: AC
  Administered 2014-12-17 (×3): 20 mg via INTRAVENOUS
  Filled 2014-12-17 (×3): qty 2

## 2014-12-17 MED ORDER — SODIUM CHLORIDE 0.9 % IV SOLN
1.0000 g | Freq: Three times a day (TID) | INTRAVENOUS | Status: DC
  Administered 2014-12-17 – 2014-12-20 (×9): 1 g via INTRAVENOUS
  Filled 2014-12-17 (×12): qty 1

## 2014-12-17 NOTE — Progress Notes (Signed)
Results for YUE, GLASHEEN (MRN [id number]) as of 12/17/2014 09:56  Ref. Range 12/16/2014 15:26 12/16/2014 19:33 12/16/2014 23:16 12/17/2014 03:31 12/17/2014 07:17  Glucose-Capillary Latest Range: 70-99 mg/dL 230 (H) 270 (H) 296 (H) 256 (H) 241 (H)  CBGs continue to be greater than 180 mg/dl. Recommend increasing Lantus to 20-25 units daily. Patient takes Lantus 25 units at home. Continue Novolog MODERATE correction scale every 4 hours.  Will continue to follow while in hospital. Harvel Ricks RN BSN CDE

## 2014-12-17 NOTE — Progress Notes (Signed)
PULMONARY / CRITICAL CARE MEDICINE   Name: Jason [name] MRN: [id number] DOB: [birth year]    ADMISSION DATE:  12/01/2014 CONSULTATION DATE:  12/12/2014  REFERRING MD :  Erlinda Hong  CHIEF COMPLAINT:  Confusion  INITIAL PRESENTATION: 29 year old male with a history of spinal cord infarct and paraplegia who has had multiple recent infections was hospitalized on 12/01/2014 for hallucinations believed to be related to meropenem. His mental status improved but he developed worsening sepsis and recurrent altered mental status at pulmonary and critical care medicine was consulted on 12/12/2014.  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  Comfortable on vent.  VITAL SIGNS: Temp:  [97.4 F (36.3 C)-99.1 F (37.3 C)] 99.1 F (37.3 C) (03/11 0721) Pulse Rate:  [78-108] 108 (03/11 0900) Resp:  [10-24] 18 (03/11 0900) BP: (119-138)/(58-78) 133/67 mmHg (03/11 0900) SpO2:  [95 %-100 %] 100 % (03/11 0900) FiO2 (%):  [40 %] 40 % (03/11 0743) Weight:  [59.2 kg (130 lb 8.2 oz)] 59.2 kg (130 lb 8.2 oz) (03/11 0258)   HEMODYNAMICS:    VENTILATOR SETTINGS: Vent Mode:  [-] PRVC FiO2 (%):  [40 %] 40 % Set Rate:  [16 bmp] 16 bmp Vt Set:  [500 mL] 500 mL PEEP:  [5 cmH20] 5 cmH20 Plateau Pressure:  [16 cmH20-20 cmH20] 17 cmH20   INTAKE / OUTPUT:  Intake/Output Summary (Last 24 hours) at 12/17/14 0915 Last data filed at 12/17/14 0900  Gross per 24 hour  Intake 3589.58 ml  Output   2775 ml  Net 814.58 ml    PHYSICAL EXAMINATION: General:   Chronically ill young adult Neuro: Opens eyes to physical stimulation but falls asleep very easily, not completely alert, on fentanyl drip. HEENT:  NCAT, PERRL, EOM-I and MMM Cardiovascular:  s1s2 rrr, no m/r/g Lungs:  Coarse BS diffusely. Abdomen:  NTND, bsx4 active  Musculoskeletal:  Atrophy noted throughout Skin:  Unable to examine sacral wounds, dressings c/d/i  LABS:  CBC  Recent Labs Lab 12/15/14 0607 12/16/14 0509 12/17/14 0228  WBC 24.6* 21.0* 24.4*  HGB 7.9* 7.7* 6.9*  HCT 24.1* 22.7* 21.6*  PLT 499* 497* 529*   Coag's No results for input(s): APTT, INR in the last 168 hours.   BMET  Recent Labs Lab 12/15/14 0607 12/16/14 0509 12/17/14 0228  NA 139 138 141  K 3.4* 3.5 4.7  CL 114* 115* 115*  CO2 19 16* 19  BUN 21 28* 26*  CREATININE 0.95 1.04 0.95  GLUCOSE 194* 316* 282*   Electrolytes  Recent Labs Lab 12/14/14 0415 12/15/14 0607 12/16/14 0509 12/17/14 0228  CALCIUM 9.4 9.8 8.9 9.3  MG 1.4*  --   --  1.4*  PHOS 3.1  --   --  2.8   Sepsis Markers  Recent Labs Lab 12/12/14 1400  LATICACIDVEN 0.9   ABG  Recent Labs Lab 12/14/14 1205 12/14/14 1414 12/17/14 0301  PHART 7.207* 7.308* 7.278*  PCO2ART 46.7* 38.6 40.3  PO2ART 53.0* 496.0* 123.0*   Liver Enzymes  Recent Labs Lab 12/12/14 0458  AST 16  ALT 13  ALKPHOS 83  BILITOT  0.7  ALBUMIN 2.1*   Cardiac Enzymes No results for input(s): TROPONINI, PROBNP in the last 168 hours.   Glucose  Recent Labs Lab 12/16/14 1127 12/16/14 1526 12/16/14 1933 12/16/14 2316 12/17/14 0331 12/17/14 0717  GLUCAP 194* 230* 270* 296* 256* 241*   Imaging Dg Chest Port 1 View  12/16/2014   CLINICAL DATA:  Ventilator dependent respiratory failure  EXAM: PORTABLE CHEST - 1 VIEW  COMPARISON:  Portable chest x-ray of 12/15/2014  FINDINGS: There is slight worsening of opacity at the right lung base with little change in the opacity medially at the left lung base most consistent with pneumonia.  No definite effusion is seen. The tip of the endotracheal tube is approximately 5.5 cm above the carina. Right central venous line tip overlies the mid lower SVC and no pneumothorax is seen. An NG tube is present.  IMPRESSION: Slight worsening of lower lobe infiltrates most consistent with pneumonia.   Electronically Signed   By: [name]   On: 12/16/2014 07:30   ASSESSMENT / PLAN:  PULMONARY A:  Acute Respiratory Failure - concern for new RLL infiltrate / possible aspiration in setting of sepsis  Healthcare Associated Pneumonia P:   Trend CXR. PEEP down to 5 and FiO2 to 40%. PS trials but no extubation given secretions and mental status. Daily SBT / WUA.  CARDIOVASCULAR PICC line February 12 >>  A:  Sepsis without shock Chronic Hypotension - on midodrine  P:  Telemetry monitoring. KVO IVF. Continue midodrine   RENAL A:   Chronic suprapubic catheter Hypokalemia  Hyperchloremia  P:   Urinalysis WBC TNT with positive nitrites.  Yeast and bacteria present. Monitor BMET and UOP. Replace electrolytes as needed. Active diureses today.  GASTROINTESTINAL A:   Aspiration Risk Protein Calorie Malnutrition  P:   OGT TF per nutrition  Will need swallow evaluation once extubated  HEMATOLOGIC A:   Leukocytosis - in setting of severe sepsis  Chronic anemia - without evidence of bleeding.  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 difficile >> negative x3 3/04 blood culture> negative 2/24 blood culture> negative 2/25 urine culture> no growth  ID to see  Antibiotics per ID  Continue vancomycin and azactam 3/6>>> Agree with addition of gram-negative coverage for (HCAP)  ENDOCRINE A:    DM2 P:  SSI, CBG Q4 while intubated Reduced lantus to 5 units QHS after intubation (was on 18 units)  NEUROLOGIC A:   Acute encephalopathy > in setting of sepsis Recent hallucinations felt to be due to carbapenem P:   Continue current regimen Fentanyl gtt for pain, will attempt to drop dose given mental status.  FAMILY  - Updates: Maintain on PS trials but no extubation given sepsis, mental status and increased secretion.  Need to continue discussing trach need.    The patient is critically ill with multiple organ systems failure and requires high complexity decision making for assessment and support, frequent evaluation and titration of therapies, application of advanced monitoring technologies and extensive interpretation of multiple databases.   Critical Care Time devoted to patient care services described in this note is  35  Minutes. This time reflects time of care of this signee Dr  Jennet Maduro. This critical care time does not reflect procedure time, or teaching time or supervisory time of PA/NP/Med student/Med Resident etc but could involve care discussion time.  Rush Farmer, M.D. [facility] Pulmonary/Critical Care Medicine. Pager: [phone]. After hours pager: [phone].  12/17/2014, 9:15 AM

## 2014-12-17 NOTE — Progress Notes (Signed)
[facility] for Infectious Disease  Date of Admission:  12/01/2014  Antibiotics: Vancomycin, day #6  of aztreonam,  flagyl  Subjective: Fever up to 101.40F last night adn still febrile this afternoon up to 102, improved with ibuprofen, remains intubated.   Objective: Temp:  [97.4 F (36.3 C)-101.2 F (38.4 C)] 101.2 F (38.4 C) (03/11 1129) Pulse Rate:  [84-110] 106 (03/11 1300) Resp:  [10-24] 14 (03/11 1400) BP: (116-148)/(55-78) 128/70 mmHg (03/11 1400) SpO2:  [100 %] 100 % (03/11 1300) FiO2 (%):  [40 %] 40 % (03/11 1131) Weight:  [130 lb 8.2 oz (59.2 kg)] 130 lb 8.2 oz (59.2 kg) (03/11 0258)  General: intubated, sedated Skin: no rashes Lungs: CTAB in anterior lung fields Cor: tachy RR Abdomen: soft, nt, nd Ext: wound on left leg, and hip covered   Lab Results Lab Results  Component Value Date   WBC 24.4* 12/17/2014   HGB 6.9* 12/17/2014   HCT 21.6* 12/17/2014   MCV 85.0 12/17/2014   PLT 529* 12/17/2014    Lab Results  Component Value Date   CREATININE 0.95 12/17/2014   BUN 26* 12/17/2014   NA 141 12/17/2014   K 4.7 12/17/2014   CL 115* 12/17/2014   CO2 19 12/17/2014    Lab Results  Component Value Date   ALT 13 12/12/2014   AST 16 12/12/2014   ALKPHOS 83 12/12/2014   BILITOT 0.7 12/12/2014      Microbiology: Recent Results (from the past 240 hour(s))  Surgical pcr screen     Status: None   Collection Time: 12/08/14  8:39 AM  Result Value Ref Range Status   MRSA, PCR NEGATIVE NEGATIVE Final   Staphylococcus aureus NEGATIVE NEGATIVE Final    Comment:        The Xpert SA Assay (FDA approved for NASAL specimens in patients over 76 years of age), is one component of a comprehensive surveillance program.  Test performance has been validated by [facility] for patients greater than or equal to 59 year old. It is not intended to diagnose infection nor to guide or monitor treatment.   Culture, blood (routine x 2)     Status: None   Collection Time: 12/10/14  4:55 PM  Result Value Ref Range Status   Specimen Description BLOOD  Final   Special Requests NONE  Final   Culture   Final    NO GROWTH 5 DAYS Performed at [facility]    Report Status 12/17/2014 FINAL  Final  Culture, blood (routine x 2)     Status: None   Collection Time: 12/10/14  5:00 PM  Result Value Ref Range Status   Specimen Description BLOOD LEFT ARM  Final   Special Requests BOTTLES DRAWN AEROBIC ONLY 1 CC  Final   Culture   Final    NO GROWTH 5 DAYS Performed at [facility]    Report Status 12/17/2014 FINAL  Final  Urine culture     Status: None   Collection Time: 12/10/14  6:11 PM  Result Value Ref Range Status   Specimen Description URINE, CATHETERIZED  Final   Special Requests NONE  Final   Colony Count NO GROWTH Performed at [facility]   Final   Culture NO GROWTH Performed at [facility]   Final   Report Status 12/12/2014 FINAL  Final  Clostridium Difficile by PCR     Status: None   Collection Time: 12/12/14  4:22 AM  Result Value  Ref Range Status   C difficile by pcr NEGATIVE NEGATIVE Final  MRSA PCR Screening     Status: None   Collection Time: 12/12/14  3:27 PM  Result Value Ref Range Status   MRSA by PCR NEGATIVE NEGATIVE Final    Comment:        The GeneXpert MRSA Assay (FDA approved for NASAL specimens only), is one component of a comprehensive MRSA colonization surveillance program. It is not intended to diagnose MRSA infection nor to guide or monitor treatment for MRSA infections.     Studies/Results: Dg Chest Port 1 View  12/17/2014   CLINICAL DATA:  Respiratory failure.  EXAM: PORTABLE CHEST - 1 VIEW  COMPARISON:  12/16/2014.  FINDINGS: Tracheostomy tube, NG tube, right PICC line in stable position. Persistent prominent bibasilar pulmonary infiltrates. Heart size stable. No pleural effusion or pneumothorax. No acute bony abnormality .  IMPRESSION: 1. Lines and tubes in  stable position. 2. Persistent prominent bibasilar pulmonary infiltrates most consistent with pneumonia. No significant interval clearing.   Electronically Signed   By: Marcello Moores  Register   On: 12/17/2014 07:49   Dg Chest Port 1 View  12/16/2014   CLINICAL DATA:  Ventilator dependent respiratory failure  EXAM: PORTABLE CHEST - 1 VIEW  COMPARISON:  Portable chest x-ray of 12/15/2014  FINDINGS: There is slight worsening of opacity at the right lung base with little change in the opacity medially at the left lung base most consistent with pneumonia. No definite effusion is seen. The tip of the endotracheal tube is approximately 5.5 cm above the carina. Right central venous line tip overlies the mid lower SVC and no pneumothorax is seen. An NG tube is present.  IMPRESSION: Slight worsening of lower lobe infiltrates most consistent with pneumonia.   Electronically Signed   By: [name]   On: 12/16/2014 07:30    Assessment/Plan:  1) fevers with leukocytosis = thought to be due to Aspiration  pneumonia. He is currently vancomycin, aztreonam, plus metronidazole to cover ischial osteomyelitis as well as aspiration. It appears that he had brief response but now wbc increasing again in concert  with fevers. We will change back to meropenem. He had hallucinations with it, but maybe worth re-challenging, and potentially not happen again.  2) multiple decubitus ulcers and ischial osteomyelitis = continue with antibiotic regimen.   3) respiratory distress requiring vent support 2/2 pneumonia = being managed by PCCM.   Carlyle Basques, [place] for Infectious Disease [facility] [email or web address] [phone] pager   [phone] cell 12/17/2014, 2:01 PM

## 2014-12-17 NOTE — Progress Notes (Signed)
CRITICAL VALUE ALERT  Critical value received:  Hgb 6.9  Date of notification:  12-17-14  Time of notification:  0350  Critical value read back:Yes.    Nurse who received alert:  Emerson Monte  MD notified (1st page):  n/a consistent with previous results

## 2014-12-17 NOTE — Progress Notes (Signed)
ANTIBIOTIC CONSULT NOTE - FOLLOW UP  Pharmacy Consult for meorpnem Indication: osteomyelitis and pneumonia  Allergies  Allergen Reactions  . Cefuroxime Axetil Anaphylaxis  . Ertapenem Other (See Comments)    Rash and confusion  . Morphine And Related Other (See Comments)    Changed mental status, confusion, headache, visual hallucination  . Penicillins Anaphylaxis and Other (See Comments)    ?can take amoxicillin?  . Sulfa Antibiotics Anaphylaxis, Shortness Of Breath and Other (See Comments)  . Tessalon [Benzonatate] Anaphylaxis  . Shellfish Allergy Itching and Other (See Comments)    Took benadryl to alleviate reaction    Patient Measurements: Height: 5\' 8"  (172.7 cm) (from 11/08/14) Weight: 130 lb 8.2 oz (59.2 kg) IBW/kg (Calculated) : 68.4 Adjusted Body Weight:   Vital Signs: Temp: 100.6 F (38.1 C) (03/11 1530) Temp Source: Axillary (03/11 1530) BP: 127/67 mmHg (03/11 1500) Pulse Rate: 104 (03/11 1500) Intake/Output from previous day: 03/10 0701 - 03/11 0700 In: 3768.7 [I.V.:1108.7; NG/GT:1710; IV Piggyback:950] Out: 2750 [Urine:2525; Stool:225] Intake/Output from this shift: Total I/O In: 795 [I.V.:165; NG/GT:580; IV Piggyback:50] Out: 1550 [Urine:1550]  Labs:  Recent Labs  12/15/14 0607 12/16/14 0509 12/17/14 0228  WBC 24.6* 21.0* 24.4*  HGB 7.9* 7.7* 6.9*  PLT 499* 497* 529*  CREATININE 0.95 1.04 0.95   Estimated Creatinine Clearance: 95.2 mL/min (by C-G formula based on Cr of 0.95).  Recent Labs  12/17/14 1419  VANCOTROUGH 39.8*     Microbiology: Recent Results (from the past 720 hour(s))  Blood culture (routine x 2)     Status: None   Collection Time: 12/01/14  4:05 PM  Result Value Ref Range Status   Specimen Description BLOOD  LAC  Final   Special Requests BOTTLES DRAWN AEROBIC AND ANAEROBIC 5ML EACH  Final   Culture   Final    NO GROWTH 5 DAYS Performed at [facility]    Report Status 12/07/2014 FINAL  Final  Urine culture      Status: None   Collection Time: 12/02/14  2:25 AM  Result Value Ref Range Status   Specimen Description URINE, CATHETERIZED  Final   Special Requests NONE  Final   Colony Count NO GROWTH Performed at [facility]   Final   Culture NO GROWTH Performed at [facility]   Final   Report Status 12/03/2014 FINAL  Final  Surgical pcr screen     Status: None   Collection Time: 12/08/14  8:39 AM  Result Value Ref Range Status   MRSA, PCR NEGATIVE NEGATIVE Final   Staphylococcus aureus NEGATIVE NEGATIVE Final    Comment:        The Xpert SA Assay (FDA approved for NASAL specimens in patients over 47 years of age), is one component of a comprehensive surveillance program.  Test performance has been validated by [facility] for patients greater than or equal to 34 year old. It is not intended to diagnose infection nor to guide or monitor treatment.   Culture, blood (routine x 2)     Status: None   Collection Time: 12/10/14  4:55 PM  Result Value Ref Range Status   Specimen Description BLOOD  Final   Special Requests NONE  Final   Culture   Final    NO GROWTH 5 DAYS Performed at [facility]    Report Status 12/17/2014 FINAL  Final  Culture, blood (routine x 2)     Status: None   Collection Time: 12/10/14  5:00 PM  Result Value Ref Range Status   Specimen Description BLOOD LEFT ARM  Final   Special Requests BOTTLES DRAWN AEROBIC ONLY 1 CC  Final   Culture   Final    NO GROWTH 5 DAYS Performed at [facility]    Report Status 12/17/2014 FINAL  Final  Urine culture     Status: None   Collection Time: 12/10/14  6:11 PM  Result Value Ref Range Status   Specimen Description URINE, CATHETERIZED  Final   Special Requests NONE  Final   Colony Count NO GROWTH Performed at [facility]   Final   Culture NO GROWTH Performed at [facility]   Final   Report Status 12/12/2014 FINAL  Final  Clostridium Difficile by PCR     Status:  None   Collection Time: 12/12/14  4:22 AM  Result Value Ref Range Status   C difficile by pcr NEGATIVE NEGATIVE Final  MRSA PCR Screening     Status: None   Collection Time: 12/12/14  3:27 PM  Result Value Ref Range Status   MRSA by PCR NEGATIVE NEGATIVE Final    Comment:        The GeneXpert MRSA Assay (FDA approved for NASAL specimens only), is one component of a comprehensive MRSA colonization surveillance program. It is not intended to diagnose MRSA infection nor to guide or monitor treatment for MRSA infections.     Anti-infectives    Start     Dose/Rate Route Frequency Ordered Stop   12/13/14 1500  metroNIDAZOLE (FLAGYL) IVPB 500 mg     500 mg 100 mL/hr over 60 Minutes Intravenous Every 8 hours 12/13/14 1446     12/12/14 1400  aztreonam (AZACTAM) 1 g in dextrose 5 % 50 mL IVPB  Status:  Discontinued     1 g 100 mL/hr over 30 Minutes Intravenous 3 times per day 12/12/14 1258 12/17/14 1537   12/11/14 1830  fluconazole (DIFLUCAN) IVPB 200 mg     200 mg 100 mL/hr over 60 Minutes Intravenous  Once 12/11/14 1747 12/11/14 1949   12/10/14 0000  levofloxacin (LEVAQUIN) 500 MG/100ML SOLN  Status:  Discontinued     over 60 Minutes   12/10/14 1403 12/10/14    12/10/14 0000  metroNIDAZOLE (FLAGYL) 5-0.79 MG/ML-% IVPB  Status:  Discontinued     100 mL/hr over 60 Minutes   12/10/14 1403 12/10/14    12/10/14 0000  vancomycin 1,500 mg in sodium chloride 0.9 % 500 mL     1,500 mg 250 mL/hr over 120 Minutes Intravenous Every 24 hours 12/10/14 1403 12/31/14 2359   12/10/14 0000  levofloxacin (LEVAQUIN) 500 MG/100ML SOLN  Status:  Discontinued     over 60 Minutes   12/10/14 1545 12/10/14    12/10/14 0000  levofloxacin (LEVAQUIN) 500 MG/100ML SOLN     500 mg 100 mL/hr over 60 Minutes Intravenous Every 24 hours 12/10/14 1654     12/10/14 0000  metroNIDAZOLE (FLAGYL) 5-0.79 MG/ML-% IVPB     500 mg 100 mL/hr over 60 Minutes Intravenous Every 8 hours 12/10/14 1654     12/09/14 1600   metroNIDAZOLE (FLAGYL) IVPB 500 mg  Status:  Discontinued     500 mg 100 mL/hr over 60 Minutes Intravenous Every 8 hours 12/09/14 1415 12/12/14 1118   12/08/14 1627  polymyxin B 500,000 Units, bacitracin 50,000 Units in sodium chloride irrigation 0.9 % 500 mL irrigation  Status:  Discontinued       As needed 12/08/14 1628  12/08/14 1702   12/08/14 1430  vancomycin (VANCOCIN) 1,500 mg in sodium chloride 0.9 % 500 mL IVPB  Status:  Discontinued     1,500 mg 250 mL/hr over 120 Minutes Intravenous Every 24 hours 12/07/14 1405 12/07/14 1406   12/08/14 0600  ciprofloxacin (CIPRO) IVPB 400 mg  Status:  Discontinued     400 mg 200 mL/hr over 60 Minutes Intravenous On call to O.R. 12/07/14 1256 12/07/14 1259   12/07/14 1430  vancomycin (VANCOCIN) 1,500 mg in sodium chloride 0.9 % 500 mL IVPB  Status:  Discontinued     1,500 mg 250 mL/hr over 120 Minutes Intravenous Every 24 hours 12/07/14 1406 12/17/14 1542   12/04/14 1300  levofloxacin (LEVAQUIN) IVPB 500 mg  Status:  Discontinued     500 mg 100 mL/hr over 60 Minutes Intravenous Every 24 hours 12/04/14 1108 12/12/14 1118   12/04/14 1200  metroNIDAZOLE (FLAGYL) IVPB 500 mg  Status:  Discontinued     500 mg 100 mL/hr over 60 Minutes Intravenous Every 8 hours 12/04/14 1108 12/09/14 1415   12/03/14 0800  vancomycin (VANCOCIN) 1,250 mg in sodium chloride 0.9 % 250 mL IVPB  Status:  Discontinued     1,250 mg 166.7 mL/hr over 90 Minutes Intravenous Every 24 hours 12/02/14 1158 12/07/14 1405   12/02/14 1130  vancomycin (VANCOCIN) IVPB 1000 mg/200 mL premix  Status:  Discontinued     1,000 mg 200 mL/hr over 60 Minutes Intravenous Every 24 hours 12/02/14 0119 12/02/14 1155   12/02/14 0300  meropenem (MERREM) 1 g in sodium chloride 0.9 % 100 mL IVPB  Status:  Discontinued     1 g 200 mL/hr over 30 Minutes Intravenous 3 times per day 12/02/14 8115 12/04/14 1108      Assessment: 29 yo male with osteomyelitis and pneumonia will be switched back to  meropenem.  Patient was on it before.  CrCl ~95.2  Goal of Therapy:  -Resolution of infection  Plan:  - meropenem 1g iv q8h  Marjorie Deprey, Tsz-Yin 12/17/2014,3:49 PM

## 2014-12-17 NOTE — Progress Notes (Signed)
Jason [name] for vancomycin, flagyl, aztreonam  Indication: osteomyelitis  Allergies  Allergen Reactions  . Cefuroxime Axetil Anaphylaxis  . Ertapenem Other (See Comments)    Rash and confusion  . Morphine And Related Other (See Comments)    Changed mental status, confusion, headache, visual hallucination  . Penicillins Anaphylaxis and Other (See Comments)    ?can take amoxicillin?  . Sulfa Antibiotics Anaphylaxis, Shortness Of Breath and Other (See Comments)  . Tessalon [Benzonatate] Anaphylaxis  . Shellfish Allergy Itching and Other (See Comments)    Took benadryl to alleviate reaction    Patient Measurements: Height: 5\' 8"  (172.7 cm) (from 11/08/14) Weight: 130 lb 8.2 oz (59.2 kg) IBW/kg (Calculated) : 68.4 Adjusted Body Weight:   Vital Signs: Temp: 100.6 F (38.1 C) (03/11 1530) Temp Source: Axillary (03/11 1530) BP: 127/67 mmHg (03/11 1500) Pulse Rate: 104 (03/11 1500) Intake/Output from previous day: 03/10 0701 - 03/11 0700 In: 3768.7 [I.V.:1108.7; NG/GT:1710; IV Piggyback:950] Out: 2750 [Urine:2525; Stool:225] Intake/Output from this shift: Total I/O In: 795 [I.V.:165; NG/GT:580; IV Piggyback:50] Out: 1550 [Urine:1550]  Labs:  Recent Labs  12/15/14 0607 12/16/14 0509 12/17/14 0228  WBC 24.6* 21.0* 24.4*  HGB 7.9* 7.7* 6.9*  PLT 499* 497* 529*  CREATININE 0.95 1.04 0.95   Estimated Creatinine Clearance: 95.2 mL/min (by C-G formula based on Cr of 0.95).  Recent Labs  12/17/14 1419  VANCOTROUGH 39.8*     Medical History: Past Medical History  Diagnosis Date  . GERD (gastroesophageal reflux disease)   . Asthma   . Hx MRSA infection     on face  . Gastroparesis   . Diabetic neuropathy   . Seizures   . Family history of anesthesia complication     Pt mother can't have epidural procedures  . Dysrhythmia   . Pneumonia   . Arthritis   . Fibromyalgia   . 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  Type I diabetes mellitus     sees Dr. Loanne Drilling     Medications:  See EMR  Assessment: 29 yo male continues on antibiotics for osteomyelitis, decub ulcers, pneumonia.  Continues to be febrile despite adequate antibiotic coverage. Pt was previously therapeutic on this vancomycin dose and frequency (1500mg /24h), however he is now accumulating vanc, likely due to paraplegia and reduced muscle mass. SCr ~0.6 at baseline, now up to 0.95, eCrCl difficult to estimate.  Vanc PTA >>  Flagyl 2/27 >>  LVQ 2/27 >> 3/6 Merrem 2/25 >> 2/27 Aztreonam 3/6 >> Fluc x 1 3/5  2/25 VT = 11.3 on 1gm q24 - inc to 1250mg  q24 3/1 VT = 11.8 on 1250mg  q24h - inc to 1500mg  q24 3/6: VT = 19.6 3/11 VT = 39.8  2/25 UCx - neg FINAL 2/24 BCx x2 - neg FINAL 3/4 BCx x 2 ngF 3/6 mrsa pcr: neg 3/4 urine neg 3/6 cdiff neg  Goal of Therapy:  Vancomycin trough level 15-20 mcg/ml  Plan:  -Aztreonam 1gm IV q8h -Flagyl 500 IV q8h -Watch renal fx, cultures, duration of therapy -Hold vanc, recheck VR on Sun with am labs     Hughes Better, PharmD, BCPS Clinical Pharmacist Pager: [phone] 12/17/2014 3:49 PM

## 2014-12-17 NOTE — Progress Notes (Signed)
CRITICAL VALUE ALERT  Critical value received:  Vancomycin level 39.8  Date of notification:  12/17/14  Time of notification: 0263  Critical value read back:yes  Nurse who received alert:  Sherry Ruffing RN  MD notified (1st page):  J. SO pharmacist  Time of first page:  1535  MD notified (2nd page):  Time of second page:  Responding MD:  J SO pharmacist  Time MD responded:  1535

## 2014-12-17 NOTE — Plan of Care (Signed)
Patient remians very sleepy, will arouse to voice.

## 2014-12-18 ENCOUNTER — Inpatient Hospital Stay (HOSPITAL_COMMUNITY): Payer: Medicaid Other

## 2014-12-18 DIAGNOSIS — L89899 Pressure ulcer of other site, unspecified stage: Secondary | ICD-10-CM

## 2014-12-18 DIAGNOSIS — J9600 Acute respiratory failure, unspecified whether with hypoxia or hypercapnia: Secondary | ICD-10-CM | POA: Diagnosis present

## 2014-12-18 DIAGNOSIS — K219 Gastro-esophageal reflux disease without esophagitis: Secondary | ICD-10-CM

## 2014-12-18 DIAGNOSIS — E1040 Type 1 diabetes mellitus with diabetic neuropathy, unspecified: Secondary | ICD-10-CM

## 2014-12-18 DIAGNOSIS — J45909 Unspecified asthma, uncomplicated: Secondary | ICD-10-CM

## 2014-12-18 DIAGNOSIS — G8220 Paraplegia, unspecified: Secondary | ICD-10-CM

## 2014-12-18 LAB — GLUCOSE, CAPILLARY
GLUCOSE-CAPILLARY: 188 mg/dL — AB (ref 70–99)
GLUCOSE-CAPILLARY: 222 mg/dL — AB (ref 70–99)
Glucose-Capillary: 209 mg/dL — ABNORMAL HIGH (ref 70–99)
Glucose-Capillary: 261 mg/dL — ABNORMAL HIGH (ref 70–99)
Glucose-Capillary: 202 mg/dL — ABNORMAL HIGH (ref 70–99)
Glucose-Capillary: 187 mg/dL — ABNORMAL HIGH (ref 70–99)

## 2014-12-18 LAB — POCT I-STAT 3, ART BLOOD GAS (G3+)
Acid-base deficit: 4 mmol/L — ABNORMAL HIGH (ref 0.0–2.0)
Bicarbonate: 21.3 mEq/L (ref 20.0–24.0)
O2 SAT: 99 %
PCO2 ART: 38.7 mmHg (ref 35.0–45.0)
PO2 ART: 151 mmHg — AB (ref 80.0–100.0)
Patient temperature: 100
TCO2: 22 mmol/L (ref 0–100)
pH, Arterial: 7.352 (ref 7.350–7.450)

## 2014-12-18 LAB — TYPE AND SCREEN
ABO/RH(D): O POS
Antibody Screen: NEGATIVE
Unit division: 0
Unit division: 0

## 2014-12-18 LAB — CBC
HEMATOCRIT: 23.2 % — AB (ref 39.0–52.0)
HEMOGLOBIN: 7.4 g/dL — AB (ref 13.0–17.0)
MCH: 26.7 pg (ref 26.0–34.0)
MCHC: 31.9 g/dL (ref 30.0–36.0)
MCV: 83.8 fL (ref 78.0–100.0)
PLATELETS: 586 10*3/uL — AB (ref 150–400)
RBC: 2.77 MIL/uL — AB (ref 4.22–5.81)
RDW: 17.8 % — ABNORMAL HIGH (ref 11.5–15.5)
WBC: 19.1 10*3/uL — ABNORMAL HIGH (ref 4.0–10.5)

## 2014-12-18 LAB — BASIC METABOLIC PANEL
Anion gap: 5 (ref 5–15)
BUN: 23 mg/dL (ref 6–23)
CO2: 24 mmol/L (ref 19–32)
Calcium: 9.8 mg/dL (ref 8.4–10.5)
Chloride: 115 mmol/L — ABNORMAL HIGH (ref 96–112)
Creatinine, Ser: 0.84 mg/dL (ref 0.50–1.35)
GFR calc Af Amer: >90 mL/min (ref >90)
GFR calc non Af Amer: >90 mL/min (ref >90)
GLUCOSE: 222 mg/dL — AB (ref 70–99)
Potassium: 3.6 mmol/L (ref 3.5–5.1)
Sodium: 144 mmol/L (ref 135–145)

## 2014-12-18 LAB — PHOSPHORUS: PHOSPHORUS: 3 mg/dL (ref 2.3–4.6)

## 2014-12-18 LAB — MAGNESIUM: Magnesium: 1.4 mg/dL — ABNORMAL LOW (ref 1.5–2.5)

## 2014-12-18 MED ORDER — POTASSIUM CHLORIDE 10 MEQ/50ML IV SOLN: 10.0000 meq | INTRAVENOUS | Status: DC

## 2014-12-18 MED ORDER — FENTANYL CITRATE 0.05 MG/ML IJ SOLN
12.5000 ug | Freq: Once | INTRAMUSCULAR | Status: AC
  Administered 2014-12-18: 12.5 ug via INTRAVENOUS

## 2014-12-18 MED ORDER — IPRATROPIUM-ALBUTEROL 0.5-2.5 (3) MG/3ML IN SOLN
3.0000 mL | Freq: Four times a day (QID) | RESPIRATORY_TRACT | Status: DC
  Administered 2014-12-18 – 2014-12-19 (×4): 3 mL via RESPIRATORY_TRACT
  Filled 2014-12-18 (×4): qty 3

## 2014-12-18 MED ORDER — MAGNESIUM SULFATE 50 % IJ SOLN
6.0000 g | Freq: Once | INTRAMUSCULAR | Status: AC
  Administered 2014-12-18: 6 g via INTRAVENOUS
  Filled 2014-12-18: qty 12

## 2014-12-18 MED ORDER — ENOXAPARIN SODIUM 40 MG/0.4ML ~~LOC~~ SOLN
40.0000 mg | SUBCUTANEOUS | Status: DC
  Administered 2014-12-18 – 2014-12-24 (×7): 40 mg via SUBCUTANEOUS
  Filled 2014-12-18 (×7): qty 0.4

## 2014-12-18 MED ORDER — FENTANYL CITRATE 0.05 MG/ML IJ SOLN
12.5000 ug | INTRAMUSCULAR | Status: DC | PRN
  Administered 2014-12-18 – 2014-12-19 (×4): 25 ug via INTRAVENOUS
  Administered 2014-12-19: 12.5 ug via INTRAVENOUS
  Administered 2014-12-21 – 2014-12-24 (×11): 25 ug via INTRAVENOUS
  Filled 2014-12-18 (×19): qty 2

## 2014-12-18 MED ORDER — POTASSIUM CHLORIDE 20 MEQ/15ML (10%) PO SOLN
20.0000 meq | ORAL | Status: AC
  Administered 2014-12-18 (×2): 20 meq
  Filled 2014-12-18 (×2): qty 15

## 2014-12-18 NOTE — Progress Notes (Signed)
[facility] ADULT ICU REPLACEMENT PROTOCOL FOR AM LAB REPLACEMENT ONLY  The patient does apply for the [facility] Adult ICU Electrolyte Replacment Protocol based on the criteria listed below:   1. Is GFR >/= 40 ml/min? Yes.    Patient's GFR today is >90 2. Is urine output >/= 0.5 ml/kg/hr for the last 6 hours? Yes.   Patient's UOP is 2.0 ml/kg/hr 3. Is BUN < 60 mg/dL? Yes.    Patient's BUN today is 23 4. Abnormal electrolyte(s): K 3.6, Magnesium 1.4 5. Ordered repletion with: [facility] Adult ICU replacement protocol 6. If a panic level lab has been reported, has the CCM MD in charge been notified? Yes.  .   Physician:  Dr. Benjamine Mola Deterding  [facility], Darrick Huntsman E 12/18/2014 5:40 AM

## 2014-12-18 NOTE — Procedures (Signed)
Extubation Procedure Note  Patient Details:   Name: Jason [name] DOB: 08/04/1986 MRN: [id number]   Airway Documentation:     Evaluation  O2 sats: stable throughout Complications: No apparent complications Patient did tolerate procedure well. Bilateral Breath Sounds: Clear Suctioning: Airway Yes  Patient tolerated wean. MD ordered to extubate. Positive for cuff leak. Patient extubated to 3 Lpm nasal cannula. No signs of dyspnea or stridor. Instructed on the Incentive Spirometer achieving 600 mL, 8 times. RN at bedside. Patient resting comfortably.  Myrtie Neither 12/18/2014, 10:00 AM

## 2014-12-18 NOTE — Progress Notes (Addendum)
INFECTIOUS DISEASE PROGRESS NOTE  ID: Jason [name] is a 29 y.o. male with  Principal Problem:   Acute encephalopathy Active Problems:   Type 1 diabetes, uncontrolled, with neuropathy   Asthma   GERD   Gastroparesis due to DM   Protein-calorie malnutrition, severe   Spinal cord infarction (history of)   Tetraplegia   Neurogenic bladder   UTI (urinary tract infection)   Sacral pressure sore   Stage IV decubitus ulcer   Osteomyelitis, pelvis   Anemia   Burkholderia cepacia infection   Disorientation   PICC (peripherally inserted central catheter) in place   Temperature elevated  Subjective: Without complaints  Abtx:  Anti-infectives    Start     Dose/Rate Route Frequency Ordered Stop   12/17/14 1700  meropenem (MERREM) 1 g in sodium chloride 0.9 % 100 mL IVPB     1 g 200 mL/hr over 30 Minutes Intravenous Every 8 hours 12/17/14 1550     12/13/14 1500  metroNIDAZOLE (FLAGYL) IVPB 500 mg     500 mg 100 mL/hr over 60 Minutes Intravenous Every 8 hours 12/13/14 1446     12/12/14 1400  aztreonam (AZACTAM) 1 g in dextrose 5 % 50 mL IVPB  Status:  Discontinued     1 g 100 mL/hr over 30 Minutes Intravenous 3 times per day 12/12/14 1258 12/17/14 1537   12/11/14 1830  fluconazole (DIFLUCAN) IVPB 200 mg     200 mg 100 mL/hr over 60 Minutes Intravenous  Once 12/11/14 1747 12/11/14 1949   12/10/14 0000  levofloxacin (LEVAQUIN) 500 MG/100ML SOLN  Status:  Discontinued     over 60 Minutes   12/10/14 1403 12/10/14    12/10/14 0000  metroNIDAZOLE (FLAGYL) 5-0.79 MG/ML-% IVPB  Status:  Discontinued     100 mL/hr over 60 Minutes   12/10/14 1403 12/10/14    12/10/14 0000  vancomycin 1,500 mg in sodium chloride 0.9 % 500 mL     1,500 mg 250 mL/hr over 120 Minutes Intravenous Every 24 hours 12/10/14 1403 12/31/14 2359   12/10/14 0000  levofloxacin (LEVAQUIN) 500 MG/100ML SOLN  Status:  Discontinued     over 60 Minutes   12/10/14 1545 12/10/14    12/10/14 0000  levofloxacin (LEVAQUIN)  500 MG/100ML SOLN     500 mg 100 mL/hr over 60 Minutes Intravenous Every 24 hours 12/10/14 1654     12/10/14 0000  metroNIDAZOLE (FLAGYL) 5-0.79 MG/ML-% IVPB     500 mg 100 mL/hr over 60 Minutes Intravenous Every 8 hours 12/10/14 1654     12/09/14 1600  metroNIDAZOLE (FLAGYL) IVPB 500 mg  Status:  Discontinued     500 mg 100 mL/hr over 60 Minutes Intravenous Every 8 hours 12/09/14 1415 12/12/14 1118   12/08/14 1627  polymyxin B 500,000 Units, bacitracin 50,000 Units in sodium chloride irrigation 0.9 % 500 mL irrigation  Status:  Discontinued       As needed 12/08/14 1628 12/08/14 1702   12/08/14 1430  vancomycin (VANCOCIN) 1,500 mg in sodium chloride 0.9 % 500 mL IVPB  Status:  Discontinued     1,500 mg 250 mL/hr over 120 Minutes Intravenous Every 24 hours 12/07/14 1405 12/07/14 1406   12/08/14 0600  ciprofloxacin (CIPRO) IVPB 400 mg  Status:  Discontinued     400 mg 200 mL/hr over 60 Minutes Intravenous On call to O.R. 12/07/14 1256 12/07/14 1259   12/07/14 1430  vancomycin (VANCOCIN) 1,500 mg in sodium chloride 0.9 % 500 mL IVPB  Status:  Discontinued     1,500 mg 250 mL/hr over 120 Minutes Intravenous Every 24 hours 12/07/14 1406 12/17/14 1542   12/04/14 1300  levofloxacin (LEVAQUIN) IVPB 500 mg  Status:  Discontinued     500 mg 100 mL/hr over 60 Minutes Intravenous Every 24 hours 12/04/14 1108 12/12/14 1118   12/04/14 1200  metroNIDAZOLE (FLAGYL) IVPB 500 mg  Status:  Discontinued     500 mg 100 mL/hr over 60 Minutes Intravenous Every 8 hours 12/04/14 1108 12/09/14 1415   12/03/14 0800  vancomycin (VANCOCIN) 1,250 mg in sodium chloride 0.9 % 250 mL IVPB  Status:  Discontinued     1,250 mg 166.7 mL/hr over 90 Minutes Intravenous Every 24 hours 12/02/14 1158 12/07/14 1405   12/02/14 1130  vancomycin (VANCOCIN) IVPB 1000 mg/200 mL premix  Status:  Discontinued     1,000 mg 200 mL/hr over 60 Minutes Intravenous Every 24 hours 12/02/14 0119 12/02/14 1155   12/02/14 0300  meropenem  (MERREM) 1 g in sodium chloride 0.9 % 100 mL IVPB  Status:  Discontinued     1 g 200 mL/hr over 30 Minutes Intravenous 3 times per day 12/02/14 0212 12/04/14 1108      Medications:  Scheduled: . baclofen  10 mg Oral BID WC  . baclofen  20 mg Oral QHS  . enoxaparin (LOVENOX) injection  40 mg Subcutaneous Q24H  . fentaNYL  25 mcg Transdermal Q72H  . insulin aspart  0-15 Units Subcutaneous 6 times per day  . insulin glargine  15 Units Subcutaneous Daily  . ipratropium-albuterol  3 mL Nebulization Q6H  . lidocaine  1 application Topical Once per day on Mon Wed Fri  . liver oil-zinc oxide   Topical BID  . magnesium sulfate LVP 250-500 ml  6 g Intravenous Once  . meropenem (MERREM) IV  1 g Intravenous Q8H  . metronidazole  500 mg Intravenous Q8H  . mirtazapine  15 mg Oral QHS  . mupirocin ointment  1 application Nasal Once per day on Mon Wed Fri  . pregabalin  100 mg Oral TID AC  . pregabalin  200 mg Oral QHS  . sodium chloride  3 mL Intravenous Q12H    Objective: Vital signs in last 24 hours: Temp:  [98.6 F (37 C)-100.6 F (38.1 C)] 98.6 F (37 C) (03/12 1127) Pulse Rate:  [99-111] 108 (03/12 1100) Resp:  [12-24] 19 (03/12 1100) BP: (116-150)/(53-77) 144/77 mmHg (03/12 1100) SpO2:  [100 %] 100 % (03/12 1100) FiO2 (%):  [40 %] 40 % (03/12 0804) Weight:  [57 kg (125 lb 10.6 oz)] 57 kg (125 lb 10.6 oz) (03/12 0406)   General appearance: alert, cooperative and no distress Resp: rhonchi bilaterally Cardio: regular rate and rhythm GI: normal findings: soft, non-tender and abnormal findings:  hypoactive bowel sounds  Lab Results  Recent Labs  12/17/14 0228 12/18/14 0313  WBC 24.4* 19.1*  HGB 6.9* 7.4*  HCT 21.6* 23.2*  NA 141 144  K 4.7 3.6  CL 115* 115*  CO2 19 24  BUN 26* 23  CREATININE 0.95 0.84   Liver Panel No results for input(s): PROT, ALBUMIN, AST, ALT, ALKPHOS, BILITOT, BILIDIR, IBILI in the last 72 hours. Sedimentation Rate No results for input(s):  ESRSEDRATE in the last 72 hours. C-Reactive Protein No results for input(s): CRP in the last 72 hours.  Microbiology: Recent Results (from the past 240 hour(s))  Culture, blood (routine x 2)     Status: None  Collection Time: 12/10/14  4:55 PM  Result Value Ref Range Status   Specimen Description BLOOD  Final   Special Requests NONE  Final   Culture   Final    NO GROWTH 5 DAYS Performed at [facility]    Report Status 12/17/2014 FINAL  Final  Culture, blood (routine x 2)     Status: None   Collection Time: 12/10/14  5:00 PM  Result Value Ref Range Status   Specimen Description BLOOD LEFT ARM  Final   Special Requests BOTTLES DRAWN AEROBIC ONLY 1 CC  Final   Culture   Final    NO GROWTH 5 DAYS Performed at [facility]    Report Status 12/17/2014 FINAL  Final  Urine culture     Status: None   Collection Time: 12/10/14  6:11 PM  Result Value Ref Range Status   Specimen Description URINE, CATHETERIZED  Final   Special Requests NONE  Final   Colony Count NO GROWTH Performed at [facility]   Final   Culture NO GROWTH Performed at [facility]   Final   Report Status 12/12/2014 FINAL  Final  Clostridium Difficile by PCR     Status: None   Collection Time: 12/12/14  4:22 AM  Result Value Ref Range Status   C difficile by pcr NEGATIVE NEGATIVE Final  MRSA PCR Screening     Status: None   Collection Time: 12/12/14  3:27 PM  Result Value Ref Range Status   MRSA by PCR NEGATIVE NEGATIVE Final    Comment:        The GeneXpert MRSA Assay (FDA approved for NASAL specimens only), is one component of a comprehensive MRSA colonization surveillance program. It is not intended to diagnose MRSA infection nor to guide or monitor treatment for MRSA infections.     Studies/Results: Dg Chest Port 1 View  12/18/2014   CLINICAL DATA:  29 year old male with a history of shortness of breath.  EXAM: PORTABLE CHEST - 1 VIEW  COMPARISON:  Multiple  prior chest x-ray, most recent 12/17/2014, 12/16/2014  FINDINGS: Cardiomediastinal silhouette unchanged in size and contour.  Airspace disease persists in the bilateral lower lungs, though overall improved from the comparison plain film.  No large pleural effusion.  No pneumothorax.  Unchanged position of the endotracheal tube, which appears to terminate approximately 4.9 cm above the carina.  Enteric tube projects over the mediastinum.  Unchanged position of right approach PICC.  IMPRESSION: Improving bibasilar airspace disease.  Unchanged position of endotracheal tube enteric tube and right upper extremity PICC.  Signed,  Dulcy Fanny. Earleen Newport, DO  [facility]  [facility]   Electronically Signed   By: Corrie Mckusick D.O.   On: 12/18/2014 07:18   Dg Chest Port 1 View  12/17/2014   CLINICAL DATA:  Respiratory failure.  EXAM: PORTABLE CHEST - 1 VIEW  COMPARISON:  12/16/2014.  FINDINGS: Tracheostomy tube, NG tube, right PICC line in stable position. Persistent prominent bibasilar pulmonary infiltrates. Heart size stable. No pleural effusion or pneumothorax. No acute bony abnormality .  IMPRESSION: 1. Lines and tubes in stable position. 2. Persistent prominent bibasilar pulmonary infiltrates most consistent with pneumonia. No significant interval clearing.   Electronically Signed   By: Marcello Moores  Register   On: 12/17/2014 07:49     Assessment/Plan: Sacral debubitus Suspected aspiration pna Extubated Paraplegia Protein calorie malnutrition, severe  No further issues with his anbx.  Not sure he needs double anaerobe  coverage.  Will be available if questions on Sunday  Total days of antibiotics: 5 flagyl, 3 merrem         [name] Infectious Diseases (pager) [phone] [email or web address] 12/18/2014, 11:34 AM  LOS: 17 days

## 2014-12-18 NOTE — Progress Notes (Signed)
PULMONARY / CRITICAL CARE MEDICINE   Name: Jason [name] MRN: [id number] DOB: [birth year]    ADMISSION DATE:  12/01/2014 CONSULTATION DATE:  12/12/2014  REFERRING MD :  Erlinda Hong  CHIEF COMPLAINT:  Confusion  INITIAL PRESENTATION: 29 year old male with a history of spinal cord infarct and paraplegia who has had multiple recent infections was hospitalized on 12/01/2014 for hallucinations believed to be related to meropenem. His mental status improved but he developed worsening sepsis and recurrent altered mental status at pulmonary and critical care medicine was consulted on 12/12/2014.  STUDIES:  3/05  CT chest abdomen pelvis >> left lower lobe pneumonia improved, right lower lobe pneumonia worse, 11 mm pulmonary nodule right upper lobe, bladder wall thickening around suprapubic catheter, cannot exclude pyelonephritis, osteomyelitis right ischium associated with decubitus ulcers  SIGNIFICANT EVENTS: 3/02  debridement of decubitus ulcers and wound VAC 3/07  SDU orders 3/08  Respiratory decompensation, intubated  3/08 one unit PRBCs for Hgb 6.4 3/09  Stable on vent, no distress  3/12 Extubated  SUBJECTIVE:  Passed SBT. Extubated and comfortable. Cough mechanics poor. Cognition intact  VITAL SIGNS: Temp:  [98.6 F (37 C)-100.6 F (38.1 C)] 98.6 F (37 C) (03/12 1127) Pulse Rate:  [99-111] 99 (03/12 1300) Resp:  [12-24] 16 (03/12 1300) BP: (125-150)/(53-77) 149/74 mmHg (03/12 1300) SpO2:  [100 %] 100 % (03/12 1416) FiO2 (%):  [40 %] 40 % (03/12 0804) Weight:  [57 kg (125 lb 10.6 oz)] 57 kg (125 lb 10.6 oz) (03/12 0406)   HEMODYNAMICS:    VENTILATOR SETTINGS: Vent Mode:  [-] PSV;CPAP FiO2 (%):  [40 %] 40 % Set Rate:  [16 bmp] 16 bmp Vt Set:  [500 mL] 500 mL PEEP:  [5 cmH20] 5 cmH20 Pressure Support:  [5 cmH20-10 cmH20] 5 cmH20 Plateau Pressure:  [16 cmH20-17 cmH20] 17 cmH20   INTAKE / OUTPUT:  Intake/Output Summary (Last 24 hours) at 12/18/14 1424 Last data filed at 12/18/14  1300  Gross per 24 hour  Intake 2518.82 ml  Output   4575 ml  Net -2056.18 ml    PHYSICAL EXAMINATION: General: NAD Neuro: RASS 0, + F/C. quadriparesis HEENT: WNL Cardiovascular: Reg, no M Lungs: scatterd rhonchi Abdomen:  Soft, + BS Ext: warm, no edema  LABS: I have reviewed all of today's lab results. Relevant abnormalities are discussed in the A/P section  CXR: improved aeration  ASSESSMENT / PLAN:  PULMONARY A:  Acute Respiratory Failure  Atelectasis Possible HCAP  Impaired cough mechanics due to quad status P:   Monitor in ICU post extubation Supp O2 Careful attention to airway hygiene Quad cough PRN  CARDIOVASCULAR PICC line February 12 >>  A:  Chronic Hypotension - on midodrine  P:  Cont midodrine PRN Monitor  RENAL A:   Chronic suprapubic catheter Hypokalemia, resolved P:   Monitor BMET intermittently Monitor I/Os Correct electrolytes as indicated  GASTROINTESTINAL A:   Protein Calorie Malnutrition  P:   SUP: N/I post extubation Advance diet as able  HEMATOLOGIC A:   Chronic anemia - without evidence of bleeding.  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 difficile >> negative x3 3/04 blood culture> negative 2/24 blood culture> negative 2/25 urine culture> no growth  ID following  Antibiotics per ID   ENDOCRINE A:   DM I, controlled P:  Cont Lantus Cont SSI  NEUROLOGIC A:   Acute encephalopathy, resolved P:   Minimize sedative/analgesics   CCM X 30 mins  Merton Border, MD ; [facility] service Mobile [phone].  After 5:30 PM or weekends, call [phone]

## 2014-12-18 NOTE — Plan of Care (Signed)
Problem: Phase III Progression Outcomes Goal: Foley discontinued Outcome: Not Applicable Date Met:  39/67/28 Suprapubic catheter chronic

## 2014-12-19 LAB — BASIC METABOLIC PANEL
ANION GAP: 5 (ref 5–15)
BUN: 16 mg/dL (ref 6–23)
CALCIUM: 9.5 mg/dL (ref 8.4–10.5)
CHLORIDE: 110 mmol/L (ref 96–112)
CO2: 25 mmol/L (ref 19–32)
CREATININE: 0.77 mg/dL (ref 0.50–1.35)
GFR calc Af Amer: >90 mL/min (ref >90)
GFR calc non Af Amer: >90 mL/min (ref >90)
Glucose, Bld: 167 mg/dL — ABNORMAL HIGH (ref 70–99)
Potassium: 3.7 mmol/L (ref 3.5–5.1)
Sodium: 140 mmol/L (ref 135–145)

## 2014-12-19 LAB — GLUCOSE, CAPILLARY
GLUCOSE-CAPILLARY: 180 mg/dL — AB (ref 70–99)
GLUCOSE-CAPILLARY: 155 mg/dL — AB (ref 70–99)
GLUCOSE-CAPILLARY: 308 mg/dL — AB (ref 70–99)
Glucose-Capillary: 168 mg/dL — ABNORMAL HIGH (ref 70–99)
Glucose-Capillary: 188 mg/dL — ABNORMAL HIGH (ref 70–99)
Glucose-Capillary: 268 mg/dL — ABNORMAL HIGH (ref 70–99)

## 2014-12-19 LAB — MAGNESIUM: Magnesium: 1.9 mg/dL (ref 1.5–2.5)

## 2014-12-19 MED ORDER — POTASSIUM CHLORIDE CRYS ER 20 MEQ PO TBCR
20.0000 meq | EXTENDED_RELEASE_TABLET | ORAL | Status: AC
  Administered 2014-12-19 (×2): 20 meq via ORAL
  Filled 2014-12-19: qty 1

## 2014-12-19 MED ORDER — IPRATROPIUM-ALBUTEROL 0.5-2.5 (3) MG/3ML IN SOLN: 3.0000 mL | RESPIRATORY_TRACT | Status: DC | PRN

## 2014-12-19 MED ORDER — INSULIN ASPART 100 UNIT/ML ~~LOC~~ SOLN
0.0000 [IU] | Freq: Three times a day (TID) | SUBCUTANEOUS | Status: DC
  Administered 2014-12-19: 7 [IU] via SUBCUTANEOUS
  Administered 2014-12-20: 3 [IU] via SUBCUTANEOUS
  Administered 2014-12-20: 2 [IU] via SUBCUTANEOUS
  Administered 2014-12-20 – 2014-12-21 (×2): 1 [IU] via SUBCUTANEOUS
  Administered 2014-12-21: 7 [IU] via SUBCUTANEOUS
  Administered 2014-12-22: 1 [IU] via SUBCUTANEOUS
  Administered 2014-12-22 – 2014-12-23 (×4): 3 [IU] via SUBCUTANEOUS
  Administered 2014-12-24: 9 [IU] via SUBCUTANEOUS
  Administered 2014-12-24: 5 [IU] via SUBCUTANEOUS
  Administered 2014-12-24: 3 [IU] via SUBCUTANEOUS

## 2014-12-19 MED ORDER — INSULIN GLARGINE 100 UNIT/ML ~~LOC~~ SOLN
15.0000 [IU] | Freq: Every day | SUBCUTANEOUS | Status: DC
  Administered 2014-12-19: 15 [IU] via SUBCUTANEOUS
  Filled 2014-12-19 (×2): qty 0.15

## 2014-12-19 MED ORDER — POTASSIUM CHLORIDE CRYS ER 20 MEQ PO TBCR
EXTENDED_RELEASE_TABLET | ORAL | Status: AC
  Filled 2014-12-19: qty 1

## 2014-12-19 NOTE — Progress Notes (Signed)
100cc fentanyl wasted with Carlus Pavlov, RN. Jason [name]

## 2014-12-19 NOTE — Progress Notes (Signed)
[facility] ADULT ICU REPLACEMENT PROTOCOL FOR AM LAB REPLACEMENT ONLY  The patient does apply for the [facility] Adult ICU Electrolyte Replacment Protocol based on the criteria listed below:   1. Is GFR >/= 40 ml/min? Yes.    Patient's GFR today is >90 2. Is urine output >/= 0.5 ml/kg/hr for the last 6 hours? Yes.   Patient's UOP is 2 ml/kg/hr 3. Is BUN < 60 mg/dL? Yes.    Patient's BUN today is 16 4. Abnormal electrolyte(s): K 3.7 5. Ordered repletion with: [facility] adult ICU replacement protocol 6. If a panic level lab has been reported, has the CCM MD in charge been notified? Yes.  .   Physician:  Dr. Tyler Pita Deterding  Josem Kaufmann E 12/19/2014 6:41 AM

## 2014-12-19 NOTE — Progress Notes (Signed)
Report given to 2C-RN Tish. Pt to transfer to 2C-04 via bed. Family notified of room change. Meds in chart, belongings at bedside, VS stable at time of transfer. No current questions or complaints at this time. Bedside handoff to [place], Therapist, sports.  Jalaila Caradonna L

## 2014-12-19 NOTE — Progress Notes (Addendum)
PULMONARY / CRITICAL CARE MEDICINE   Name: Jason [name] MRN: [id number] DOB: July 02, 1986    ADMISSION DATE:  12/01/2014 CONSULTATION DATE:  12/12/2014  REFERRING MD :  Erlinda Hong  CHIEF COMPLAINT:  Confusion  INITIAL PRESENTATION: 29 year old male with a history of spinal cord infarct and paraplegia who has had multiple recent infections was hospitalized on 12/01/2014 for hallucinations believed to be related to meropenem. His mental status improved but he developed worsening sepsis and recurrent altered mental status at pulmonary and critical care medicine was consulted on 12/12/2014.  STUDIES:  3/05  CT chest abdomen pelvis >> left lower lobe pneumonia improved, right lower lobe pneumonia worse, 11 mm pulmonary nodule right upper lobe, bladder wall thickening around suprapubic catheter, cannot exclude pyelonephritis, osteomyelitis right ischium associated with decubitus ulcers  SIGNIFICANT EVENTS: 3/02  debridement of decubitus ulcers and wound VAC 3/07  SDU orders 3/08  Respiratory decompensation, intubated  3/08 one unit PRBCs for Hgb 6.4 3/09  Stable on vent, no distress  3/12 Extubated  3/13 transfer to SDU ordered  SUBJECTIVE:  Comfortable. Up in chair. No new complaints  VITAL SIGNS: Temp:  [98.3 F (36.8 C)-100 F (37.8 C)] 99 F (37.2 C) (03/13 1519) Pulse Rate:  [90-104] 90 (03/13 1600) Resp:  [14-30] 22 (03/13 1600) BP: (126-163)/(67-96) 154/94 mmHg (03/13 1600) SpO2:  [97 %-100 %] 100 % (03/13 1600) Weight:  [59 kg (130 lb 1.1 oz)] 59 kg (130 lb 1.1 oz) (03/13 0500)   HEMODYNAMICS:    VENTILATOR SETTINGS:     INTAKE / OUTPUT:  Intake/Output Summary (Last 24 hours) at 12/19/14 1834 Last data filed at 12/19/14 1600  Gross per 24 hour  Intake   2070 ml  Output   3500 ml  Net  -1430 ml    PHYSICAL EXAMINATION: General: NAD Neuro: RASS 0, + F/C. quadriparesis HEENT: WNL Cardiovascular: Reg, no M Lungs: scatterd rhonchi Abdomen:  Soft, + BS Ext: warm, no  edema  LABS: I have reviewed all of today's lab results. Relevant abnormalities are discussed in the A/P section  CXR: NNF  ASSESSMENT / PLAN:  PULMONARY A:  Acute Respiratory Failure  Atelectasis Possible HCAP  Impaired cough mechanics due to quad status P:   Cont supp O2 Careful attention to airway hygiene Quad cough PRN  CARDIOVASCULAR RUE PICC line February 12 >>  A:  Chronic Hypotension - on midodrine  P:  Cont midodrine PRN Monitor  RENAL A:   Chronic suprapubic catheter Hypokalemia, resolved P:   Monitor BMET intermittently Monitor I/Os Correct electrolytes as indicated  GASTROINTESTINAL A:   Protein Calorie Malnutrition  P:   SUP: N/I post extubation Advance diet  HEMATOLOGIC A:   Chronic anemia - without evidence of bleeding.  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 difficile >> negative x3 3/04 blood culture> negative 2/24 blood culture> negative 2/25 urine culture> no growth  Antibiotics per ID   ENDOCRINE A:   DM I, controlled P:  Cont Lantus Cont SSI - change to ACHS  NEUROLOGIC A:   Acute encephalopathy, resolved P:   Minimize sedative/analgesics  Transfer to SDU. TRH to resume primary duties 3/14 and PCCM to sign off  Merton Border, MD ; [facility] [address] [phone].  After 5:30 PM or weekends, call [phone]

## 2014-12-19 NOTE — Progress Notes (Signed)
[place] Progress Note Patient Name: Jason [name] DOB: 01/15/1986 MRN: [id number]   Date of Service  12/19/2014  HPI/Events of Note  Called d/t no orders for Lantus and Novolog SSI. Review of Dr. Alva Garnet note reveals he intended to continue Lantus and AC/HS Novolog SSI. Patient is eating a diet. Last blood glucose = 308.  eICU Interventions  Will order Lantus 15 units [place] Q PM, blood glucose AC and HS and Novolog sensitive dose SSI coverage.     Intervention Category Intermediate Interventions: Hyperglycemia - evaluation and treatment  Mathieu Schloemer Cornelia Copa 12/19/2014, 5:14 PM

## 2014-12-20 DIAGNOSIS — J690 Pneumonitis due to inhalation of food and vomit: Secondary | ICD-10-CM

## 2014-12-20 LAB — BASIC METABOLIC PANEL
Anion gap: 4 — ABNORMAL LOW (ref 5–15)
BUN: 12 mg/dL (ref 6–23)
CALCIUM: 9.5 mg/dL (ref 8.4–10.5)
CO2: 27 mmol/L (ref 19–32)
CREATININE: 0.76 mg/dL (ref 0.50–1.35)
Chloride: 107 mmol/L (ref 96–112)
GFR calc Af Amer: >90 mL/min (ref >90)
GFR calc non Af Amer: >90 mL/min (ref >90)
Glucose, Bld: 225 mg/dL — ABNORMAL HIGH (ref 70–99)
Potassium: 4.2 mmol/L (ref 3.5–5.1)
Sodium: 138 mmol/L (ref 135–145)

## 2014-12-20 LAB — GLUCOSE, CAPILLARY
GLUCOSE-CAPILLARY: 146 mg/dL — AB (ref 70–99)
GLUCOSE-CAPILLARY: 197 mg/dL — AB (ref 70–99)
GLUCOSE-CAPILLARY: 214 mg/dL — AB (ref 70–99)
GLUCOSE-CAPILLARY: 226 mg/dL — AB (ref 70–99)
GLUCOSE-CAPILLARY: 269 mg/dL — AB (ref 70–99)
Glucose-Capillary: >600 mg/dL (ref 70–99)

## 2014-12-20 MED ORDER — DEXTROSE 5 % IV SOLN
1.0000 g | Freq: Three times a day (TID) | INTRAVENOUS | Status: DC
  Administered 2014-12-20 – 2014-12-22 (×6): 1 g via INTRAVENOUS
  Filled 2014-12-20 (×7): qty 1

## 2014-12-20 MED ORDER — VANCOMYCIN HCL 10 G IV SOLR
1250.0000 mg | INTRAVENOUS | Status: DC
  Administered 2014-12-20 – 2014-12-23 (×4): 1250 mg via INTRAVENOUS
  Filled 2014-12-20 (×5): qty 1250

## 2014-12-20 MED ORDER — INSULIN GLARGINE 100 UNIT/ML ~~LOC~~ SOLN
25.0000 [IU] | Freq: Every day | SUBCUTANEOUS | Status: DC
  Administered 2014-12-20 – 2014-12-22 (×3): 25 [IU] via SUBCUTANEOUS
  Filled 2014-12-20 (×4): qty 0.25

## 2014-12-20 NOTE — Progress Notes (Signed)
[facility] for Infectious Disease  Date of Admission:  12/01/2014  Antibiotics: Vancomycin, day #3 of aztreonam,  flagyl  Subjective: tmax 100.4, the highest it has been in 48 hrs, extubated roughly 2 days ago was doing well, but mother thinks his thinking is not clear, maybe starting to have hallucinations again. He underwent wound care at bedside by nursing staff, sacral wound has heavy slough reported by his mom.   Objective: Temp:  [98.8 F (37.1 C)-100.4 F (38 C)] 98.8 F (37.1 C) (03/14 1607) Pulse Rate:  [86-96] 92 (03/14 1607) Resp:  [13-30] 23 (03/14 1607) BP: (96-126)/(61-82) 108/77 mmHg (03/14 1607) SpO2:  [98 %-100 %] 100 % (03/14 1607)  General: alert, answering questions appropriately Skin: no rashes Lungs: CTAB in anterior lung fields Cor: RRR, no g/m/r Abdomen: soft, nt, nd Ext: wound on left leg, and hip covered   Lab Results Lab Results  Component Value Date   WBC 19.1* 12/18/2014   HGB 7.4* 12/18/2014   HCT 23.2* 12/18/2014   MCV 83.8 12/18/2014   PLT 586* 12/18/2014    Lab Results  Component Value Date   CREATININE 0.76 12/20/2014   BUN 12 12/20/2014   NA 138 12/20/2014   K 4.2 12/20/2014   CL 107 12/20/2014   CO2 27 12/20/2014    Lab Results  Component Value Date   ALT 13 12/12/2014   AST 16 12/12/2014   ALKPHOS 83 12/12/2014   BILITOT 0.7 12/12/2014      Microbiology: Recent Results (from the past 240 hour(s))  Urine culture     Status: None   Collection Time: 12/10/14  6:11 PM  Result Value Ref Range Status   Specimen Description URINE, CATHETERIZED  Final   Special Requests NONE  Final   Colony Count NO GROWTH Performed at [facility]   Final   Culture NO GROWTH Performed at [facility]   Final   Report Status 12/12/2014 FINAL  Final  Clostridium Difficile by PCR     Status: None   Collection Time: 12/12/14  4:22 AM  Result Value Ref Range Status   C difficile by pcr NEGATIVE NEGATIVE Final    MRSA PCR Screening     Status: None   Collection Time: 12/12/14  3:27 PM  Result Value Ref Range Status   MRSA by PCR NEGATIVE NEGATIVE Final    Comment:        The GeneXpert MRSA Assay (FDA approved for NASAL specimens only), is one component of a comprehensive MRSA colonization surveillance program. It is not intended to diagnose MRSA infection nor to guide or monitor treatment for MRSA infections.     Studies/Results: No results found.  Assessment/Plan:  1) fevers with leukocytosis = improved after switching to meropenem, however now concerned for recurrent hallucinations. We will switch him back to  vancomycin, aztreonam, plus metronidazole to cover ischial osteomyelitis as well as aspiration pneumonia  2) multiple decubitus ulcers and ischial osteomyelitis = continue with antibiotic regimen. Recommend to have dr. Migdalia Dk and her team assess wounds and provide recs, previously he had been on wound vac that had been dislodged, concern that he may need more debridement as he has had fluctuating leukocytosis and thrombocytosis  3) aspiration pneumonia = now extubated, on day #6 of antibitocs, (longer for osteo)  4) thrombocytosis = sign of underlying inflammation from infection. Will continue to monitor   Carlyle Basques, [place] for Infectious Disease [place] [email or web address] [phone]  pager   [phone] cell 12/20/2014, 5:44 PM

## 2014-12-20 NOTE — Progress Notes (Signed)
PROGRESS NOTE    Jason [name] VHQ:[id number] DOB: 1986/05/12 DOA: 12/01/2014 PCP: [name], MD  HPI/Brief narrative 29 year old male with a history of spinal cord infarct and paraplegia who has had multiple recent infections was hospitalized on 12/01/2014 for hallucinations believed to be related to meropenem. His mental status improved but he developed worsening sepsis and recurrent altered mental status. He was intubated for respiratory decompensation and transferred to ICU on 12/14/14. After stabilization, transferred back to SDU & [facility] service on 3/14.   Assessment/Plan:  1. Acute respiratory failure with hypoxia : Due to atelectasis, possible HCAP & impaired cough mechanics due to quadriplegic status. Extubated 3/12. Pulmonary hygiene. Stable. 2. Severe sepsis: Secondary to suspected aspiration pneumonia, chronic wound infection and osteomyelitis of the ischium: Antibiotics per ID.  Sepsis physiology resolved. 3. Suspected aspiration pneumonia: Continue Flagyl and meropenem. 4. 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 Status post debridement 3/2 5. Chronic hypotension: Continue midodrine 6. Chronic suprapubic catheter :  7. Hypokalemia: Replaced 8. Severe Protein calorie malnutrition: 9. Chronic anemia: Without evidence of bleeding. S/p 1 unit of PRBC on 3/8. Stable. Transfuse for hemoglobin of less than 7 g per DL.  10. Uncontrolled DM 1: Adjust insulins and monitor.  11. Acute encephalopathy: Resolved. Minimize sedatives and opioids. 12. Neurogenic bladder: Now suprapubic catheter. 13. Spinal cord infarction and quadriplegia: 14. S/p Diverting Colostomy.   Code Status: Full Family Communication: None at bedside Disposition Plan: DC home when medically stable   SIGNIFICANT EVENTS: 3/02 debridement of decubitus ulcers and wound VAC 3/07 SDU orders 3/08 Respiratory decompensation, intubated  3/08 one unit PRBCs for  Hgb 6.4 3/09 Stable on vent, no distress  3/12 Extubated  3/13 transfer to SDU ordered   Consultants:  CCM- signed off   Infectious disease  Urology  Procedures:  S/p mechanical ventilation  Antibiotics:  Meropenem   Metronidazole  Subjective: Denies complaints.  Objective: Filed Vitals:   12/20/14 1000 12/20/14 1100 12/20/14 1123 12/20/14 1200  BP: 106/62 96/68 96/68  110/67  Pulse: 93 94 96 93  Temp:   99.5 F (37.5 C)   TempSrc:   Oral   Resp: 20 23 21 13   Height:      Weight:      SpO2: 98% 100%  98%    Intake/Output Summary (Last 24 hours) at 12/20/14 1251 Last data filed at 12/20/14 1200  Gross per 24 hour  Intake   1020 ml  Output   2500 ml  Net  -1480 ml   Filed Weights   12/17/14 0258 12/18/14 0406 12/19/14 0500  Weight: 59.2 kg (130 lb 8.2 oz) 57 kg (125 lb 10.6 oz) 59 kg (130 lb 1.1 oz)     Exam:  General exam: pleasant young male sitting up comfortably in bed.  Respiratory system: slightly diminished breath sounds in the bases but otherwise clear to auscultation. No increased work of breathing. Cardiovascular system: S1 & S2 heard, RRR. No JVD, murmurs, gallops, clicks or pedal edema. telemetry: Sinus rhythm in the 90s.  Gastrointestinal system: Abdomen is nondistended, soft and nontender. Normal bowel sounds heard. Diverting colostomy with small liquid brown stools in bag. Suprapubic catheter site intact. Central nervous system: Alert and oriented. No focal neurological deficits. Extremities: chronic quadriparesis.   Data Reviewed: Basic Metabolic Panel:  Recent Labs Lab 12/14/14 0415  12/16/14 0509 12/17/14 0228 12/18/14 0313 12/19/14 0335 12/20/14 0415  NA 136  < > 138 141 144 140 138  K 3.3*  < >  3.5 4.7 3.6 3.7 4.2  CL 112  < > 115* 115* 115* 110 107  CO2 22  < > 16* 19 24 25 27   GLUCOSE 319*  < > 316* 282* 222* 167* 225*  BUN 19  < > 28* 26* 23 16 12   CREATININE 1.35  < > 1.04 0.95 0.84 0.77 0.76  CALCIUM 9.4  < >  8.9 9.3 9.8 9.5 9.5  MG 1.4*  --   --  1.4* 1.4* 1.9  --   PHOS 3.1  --   --  2.8 3.0  --   --   < > = values in this interval not displayed. Liver Function Tests: No results for input(s): AST, ALT, ALKPHOS, BILITOT, PROT, ALBUMIN in the last 168 hours. No results for input(s): LIPASE, AMYLASE in the last 168 hours. No results for input(s): AMMONIA in the last 168 hours. CBC:  Recent Labs Lab 12/13/14 2255 12/14/14 0415 12/15/14 0607 12/16/14 0509 12/17/14 0228 12/18/14 0313  WBC  --  26.9* 24.6* 21.0* 24.4* 19.1*  NEUTROABS 21.7*  --   --   --   --   --   HGB  --  6.4* 7.9* 7.7* 6.9* 7.4*  HCT  --  19.7* 24.1* 22.7* 21.6* 23.2*  MCV  --  82.4 82.3 81.7 85.0 83.8  PLT  --  478* 499* 497* 529* 586*   Cardiac Enzymes: No results for input(s): CKTOTAL, CKMB, CKMBINDEX, TROPONINI in the last 168 hours. BNP (last 3 results)  Recent Labs  01/29/14 2113  PROBNP 1343.0*   CBG:  Recent Labs Lab 12/19/14 2126 12/20/14 12/20/14 0024 12/20/14 0747 12/20/14 1125  GLUCAP 268* >600* 269* 214* 146*    Recent Results (from the past 240 hour(s))  Culture, blood (routine x 2)     Status: None   Collection Time: 12/10/14  4:55 PM  Result Value Ref Range Status   Specimen Description BLOOD  Final   Special Requests NONE  Final   Culture   Final    NO GROWTH 5 DAYS Performed at [facility]    Report Status 12/17/2014 FINAL  Final  Culture, blood (routine x 2)     Status: None   Collection Time: 12/10/14  5:00 PM  Result Value Ref Range Status   Specimen Description BLOOD LEFT ARM  Final   Special Requests BOTTLES DRAWN AEROBIC ONLY 1 CC  Final   Culture   Final    NO GROWTH 5 DAYS Performed at [facility]    Report Status 12/17/2014 FINAL  Final  Urine culture     Status: None   Collection Time: 12/10/14  6:11 PM  Result Value Ref Range Status   Specimen Description URINE, CATHETERIZED  Final   Special Requests NONE  Final   Colony Count NO  GROWTH Performed at [facility]   Final   Culture NO GROWTH Performed at [facility]   Final   Report Status 12/12/2014 FINAL  Final  Clostridium Difficile by PCR     Status: None   Collection Time: 12/12/14  4:22 AM  Result Value Ref Range Status   C difficile by pcr NEGATIVE NEGATIVE Final  MRSA PCR Screening     Status: None   Collection Time: 12/12/14  3:27 PM  Result Value Ref Range Status   MRSA by PCR NEGATIVE NEGATIVE Final    Comment:        The GeneXpert MRSA Assay (FDA approved for  NASAL specimens only), is one component of a comprehensive MRSA colonization surveillance program. It is not intended to diagnose MRSA infection nor to guide or monitor treatment for MRSA infections.           Studies: No results found.      Scheduled Meds: . baclofen  10 mg Oral BID WC  . baclofen  20 mg Oral QHS  . enoxaparin (LOVENOX) injection  40 mg Subcutaneous Q24H  . fentaNYL  25 mcg Transdermal Q72H  . insulin aspart  0-9 Units Subcutaneous TID WC  . insulin glargine  15 Units Subcutaneous QHS  . lidocaine  1 application Topical Once per day on Mon Wed Fri  . liver oil-zinc oxide   Topical BID  . meropenem (MERREM) IV  1 g Intravenous Q8H  . metronidazole  500 mg Intravenous Q8H  . mirtazapine  15 mg Oral QHS  . mupirocin ointment  1 application Nasal Once per day on Mon Wed Fri  . pregabalin  100 mg Oral TID AC  . pregabalin  200 mg Oral QHS  . sodium chloride  3 mL Intravenous Q12H   Continuous Infusions: . sodium chloride 10 mL/hr at 12/20/14 0009  . lactated ringers 10 mL/hr at 12/19/14 1100    Principal Problem:   Acute encephalopathy Active Problems:   Type 1 diabetes, uncontrolled, with neuropathy   Asthma   GERD   Gastroparesis due to DM   Protein-calorie malnutrition, severe   Spinal cord infarction (history of)   Tetraplegia   Neurogenic bladder   UTI (urinary tract infection)   Sacral pressure sore   Stage IV  decubitus ulcer   Osteomyelitis, pelvis   Anemia   Burkholderia cepacia infection   Disorientation   PICC (peripherally inserted central catheter) in place   Temperature elevated   Acute respiratory failure    Time spent: 30 minutes.    Vernell Leep, MD, FACP, FHM. [facility] Pager [phone]  If 7PM-7AM, please contact night-coverage [email or web address] Password [email or web address] 12/20/2014, 12:51 PM    LOS: 19 days

## 2014-12-20 NOTE — Progress Notes (Signed)
ANTIBIOTIC CONSULT NOTE - FOLLOW UP  Pharmacy Consult for Meropenem Indication: asp pna  Allergies  Allergen Reactions  . Cefuroxime Axetil Anaphylaxis  . Ertapenem Other (See Comments)    Rash and confusion  . Morphine And Related Other (See Comments)    Changed mental status, confusion, headache, visual hallucination  . Penicillins Anaphylaxis and Other (See Comments)    ?can take amoxicillin?  . Sulfa Antibiotics Anaphylaxis, Shortness Of Breath and Other (See Comments)  . Tessalon [Benzonatate] Anaphylaxis  . Shellfish Allergy Itching and Other (See Comments)    Took benadryl to alleviate reaction    Patient Measurements: Height: 5\' 8"  (172.7 cm) (from 11/08/14) Weight: 130 lb 1.1 oz (59 kg) IBW/kg (Calculated) : 68.4  Vital Signs: Temp: 99.5 F (37.5 C) (03/14 1123) Temp Source: Oral (03/14 1123) BP: 110/67 mmHg (03/14 1200) Pulse Rate: 93 (03/14 1200) Intake/Output from previous day: 03/13 0701 - 03/14 0700 In: 1430 [P.O.:480; I.V.:350; IV Piggyback:600] Out: 3600 [Urine:2900; Stool:700] Intake/Output from this shift: Total I/O In: 360 [P.O.:120; I.V.:40; IV Piggyback:200] Out: -   Labs:  Recent Labs  12/18/14 0313 12/19/14 0335 12/20/14 0415  WBC 19.1*  --   --   HGB 7.4*  --   --   PLT 586*  --   --   CREATININE 0.84 0.77 0.76   Estimated Creatinine Clearance: 112.7 mL/min (by C-G formula based on Cr of 0.76).  Recent Labs  12/17/14 1419  VANCOTROUGH 39.8*     Microbiology: Recent Results (from the past 720 hour(s))  Blood culture (routine x 2)     Status: None   Collection Time: 12/01/14  4:05 PM  Result Value Ref Range Status   Specimen Description BLOOD  LAC  Final   Special Requests BOTTLES DRAWN AEROBIC AND ANAEROBIC 5ML EACH  Final   Culture   Final    NO GROWTH 5 DAYS Performed at [facility]    Report Status 12/07/2014 FINAL  Final  Urine culture     Status: None   Collection Time: 12/02/14  2:25 AM  Result Value Ref  Range Status   Specimen Description URINE, CATHETERIZED  Final   Special Requests NONE  Final   Colony Count NO GROWTH Performed at [facility]   Final   Culture NO GROWTH Performed at [facility]   Final   Report Status 12/03/2014 FINAL  Final  Surgical pcr screen     Status: None   Collection Time: 12/08/14  8:39 AM  Result Value Ref Range Status   MRSA, PCR NEGATIVE NEGATIVE Final   Staphylococcus aureus NEGATIVE NEGATIVE Final    Comment:        The Xpert SA Assay (FDA approved for NASAL specimens in patients over 71 years of age), is one component of a comprehensive surveillance program.  Test performance has been validated by [facility] for patients greater than or equal to 14 year old. It is not intended to diagnose infection nor to guide or monitor treatment.   Culture, blood (routine x 2)     Status: None   Collection Time: 12/10/14  4:55 PM  Result Value Ref Range Status   Specimen Description BLOOD  Final   Special Requests NONE  Final   Culture   Final    NO GROWTH 5 DAYS Performed at [facility]    Report Status 12/17/2014 FINAL  Final  Culture, blood (routine x 2)     Status: None  Collection Time: 12/10/14  5:00 PM  Result Value Ref Range Status   Specimen Description BLOOD LEFT ARM  Final   Special Requests BOTTLES DRAWN AEROBIC ONLY 1 CC  Final   Culture   Final    NO GROWTH 5 DAYS Performed at [facility]    Report Status 12/17/2014 FINAL  Final  Urine culture     Status: None   Collection Time: 12/10/14  6:11 PM  Result Value Ref Range Status   Specimen Description URINE, CATHETERIZED  Final   Special Requests NONE  Final   Colony Count NO GROWTH Performed at [facility]   Final   Culture NO GROWTH Performed at [facility]   Final   Report Status 12/12/2014 FINAL  Final  Clostridium Difficile by PCR     Status: None   Collection Time: 12/12/14  4:22 AM  Result Value Ref Range  Status   C difficile by pcr NEGATIVE NEGATIVE Final  MRSA PCR Screening     Status: None   Collection Time: 12/12/14  3:27 PM  Result Value Ref Range Status   MRSA by PCR NEGATIVE NEGATIVE Final    Comment:        The GeneXpert MRSA Assay (FDA approved for NASAL specimens only), is one component of a comprehensive MRSA colonization surveillance program. It is not intended to diagnose MRSA infection nor to guide or monitor treatment for MRSA infections.     Anti-infectives    Start     Dose/Rate Route Frequency Ordered Stop   12/17/14 1700  meropenem (MERREM) 1 g in sodium chloride 0.9 % 100 mL IVPB     1 g 200 mL/hr over 30 Minutes Intravenous Every 8 hours 12/17/14 1550     12/13/14 1500  metroNIDAZOLE (FLAGYL) IVPB 500 mg     500 mg 100 mL/hr over 60 Minutes Intravenous Every 8 hours 12/13/14 1446     12/12/14 1400  aztreonam (AZACTAM) 1 g in dextrose 5 % 50 mL IVPB  Status:  Discontinued     1 g 100 mL/hr over 30 Minutes Intravenous 3 times per day 12/12/14 1258 12/17/14 1537   12/11/14 1830  fluconazole (DIFLUCAN) IVPB 200 mg     200 mg 100 mL/hr over 60 Minutes Intravenous  Once 12/11/14 1747 12/11/14 1949   12/10/14 0000  levofloxacin (LEVAQUIN) 500 MG/100ML SOLN  Status:  Discontinued     over 60 Minutes   12/10/14 1403 12/10/14    12/10/14 0000  metroNIDAZOLE (FLAGYL) 5-0.79 MG/ML-% IVPB  Status:  Discontinued     100 mL/hr over 60 Minutes   12/10/14 1403 12/10/14    12/10/14 0000  vancomycin 1,500 mg in sodium chloride 0.9 % 500 mL     1,500 mg 250 mL/hr over 120 Minutes Intravenous Every 24 hours 12/10/14 1403 12/31/14 2359   12/10/14 0000  levofloxacin (LEVAQUIN) 500 MG/100ML SOLN  Status:  Discontinued     over 60 Minutes   12/10/14 1545 12/10/14    12/10/14 0000  levofloxacin (LEVAQUIN) 500 MG/100ML SOLN     500 mg 100 mL/hr over 60 Minutes Intravenous Every 24 hours 12/10/14 1654     12/10/14 0000  metroNIDAZOLE (FLAGYL) 5-0.79 MG/ML-% IVPB     500 mg 100  mL/hr over 60 Minutes Intravenous Every 8 hours 12/10/14 1654     12/09/14 1600  metroNIDAZOLE (FLAGYL) IVPB 500 mg  Status:  Discontinued     500 mg 100 mL/hr over 60 Minutes  Intravenous Every 8 hours 12/09/14 1415 12/12/14 1118   12/08/14 1627  polymyxin B 500,000 Units, bacitracin 50,000 Units in sodium chloride irrigation 0.9 % 500 mL irrigation  Status:  Discontinued       As needed 12/08/14 1628 12/08/14 1702   12/08/14 1430  vancomycin (VANCOCIN) 1,500 mg in sodium chloride 0.9 % 500 mL IVPB  Status:  Discontinued     1,500 mg 250 mL/hr over 120 Minutes Intravenous Every 24 hours 12/07/14 1405 12/07/14 1406   12/08/14 0600  ciprofloxacin (CIPRO) IVPB 400 mg  Status:  Discontinued     400 mg 200 mL/hr over 60 Minutes Intravenous On call to O.R. 12/07/14 1256 12/07/14 1259   12/07/14 1430  vancomycin (VANCOCIN) 1,500 mg in sodium chloride 0.9 % 500 mL IVPB  Status:  Discontinued     1,500 mg 250 mL/hr over 120 Minutes Intravenous Every 24 hours 12/07/14 1406 12/17/14 1542   12/04/14 1300  levofloxacin (LEVAQUIN) IVPB 500 mg  Status:  Discontinued     500 mg 100 mL/hr over 60 Minutes Intravenous Every 24 hours 12/04/14 1108 12/12/14 1118   12/04/14 1200  metroNIDAZOLE (FLAGYL) IVPB 500 mg  Status:  Discontinued     500 mg 100 mL/hr over 60 Minutes Intravenous Every 8 hours 12/04/14 1108 12/09/14 1415   12/03/14 0800  vancomycin (VANCOCIN) 1,250 mg in sodium chloride 0.9 % 250 mL IVPB  Status:  Discontinued     1,250 mg 166.7 mL/hr over 90 Minutes Intravenous Every 24 hours 12/02/14 1158 12/07/14 1405   12/02/14 1130  vancomycin (VANCOCIN) IVPB 1000 mg/200 mL premix  Status:  Discontinued     1,000 mg 200 mL/hr over 60 Minutes Intravenous Every 24 hours 12/02/14 0119 12/02/14 1155   12/02/14 0300  meropenem (MERREM) 1 g in sodium chloride 0.9 % 100 mL IVPB  Status:  Discontinued     1 g 200 mL/hr over 30 Minutes Intravenous 3 times per day 12/02/14 7262 12/04/14 1108       Assessment: 28 y.o. male continues on Merrem D#4 and Flagyl D#6 for sacral decub, suspected new asp pna. Has been on Vanc for osteo. Tm 100.4, 3/12 WBC 19.1.S/p I&D 2/18, 3/2.ID following.  Vanc PTA >> 3/11 Flagyl 2/27 >>  LVQ 2/27 >> 3/6 Merrem 2/25 >> 2/27; restart 3/11 > Aztreonam 3/6 >> 3/11 Fluc x 1 3/5  2/25 VT = 11.3 on 1gm q24 - inc to 1250mg  q24 3/1 VT = 11.8 on 1250mg  q24h - inc to 1500mg  q24 3/6: VT = 19.6 3/11 VT = 39.8 (d/c vanc)  2/25 UCx - neg 2/24 BCx x2 - neg  3/4 BCx x 2 neg 3/6 mrsa pcr: neg 3/4 urine neg 3/6 cdiff neg   Goal of Therapy:  Resolution of infection  Plan:  1. Merrem 1gm IV q8h 2. Will f/u SCr, UOP, planned LOT   Sherlon Handing, PharmD, BCPS Clinical pharmacist, pager [phone] 12/20/2014,12:54 PM

## 2014-12-20 NOTE — Progress Notes (Signed)
Inpatient Diabetes Program Recommendations  AACE/ADA: New Consensus Statement on Inpatient Glycemic Control (2013)  Target Ranges:  Prepandial:   less than 140 mg/dL      Peak postprandial:   less than 180 mg/dL (1-2 hours)      Critically ill patients:  140 - 180 mg/dL    Results for ELIUS, ETHEREDGE (MRN [id number]) as of 12/20/2014 10:50  Ref. Range 12/20/2014 00:24 12/20/2014 07:47  Glucose-Capillary Latest Range: 70-99 mg/dL 269 (H) 214 (H)    Current orders for Inpatient glycemic control: Lantus 15 units QHS, Novolog 0-9 units TID  Inpatient Diabetes Program Recommendations  Insulin - Basal: Patient recieved Lantus 15 units x2 doses yesterday for a total of 30 units. Fasting Glucose this am 214 mg/dl. Please consider increasing basal insulin to home dose Lantus 25 units QHS. Insulin - Meal Coverage: Add Novolog 3 units tidwc for meal coverage insulin if pt eats >50% meal  Thanks,  Tama Headings RN, MSN, [facility] Inpatient Diabetes Coordinator Team Pager [phone]

## 2014-12-20 NOTE — Consult Note (Signed)
WOC reviewed chart, patient now intubated.  Assessment of foot and finger wounds deferred this week.  At this time no one was in the room at the time of my visit, pt resting comfortably on the vent.  Will re-assess wounds (finger and left foot next week) Nursing staff to notify me of any acute changes.  Plastics is following the sacral wound and ischial wound.  [place] team will follow along with you for weekly wound assessments.  Please notify me of any acute changes in the wounds or any new areas of concerns Para March RN,CWOCN [phone]

## 2014-12-20 NOTE — Progress Notes (Signed)
ANTIBIOTIC CONSULT NOTE - FOLLOW UP  Pharmacy Consult for Aztreonam and Vancomycin Indication: osteomyelitis  Allergies  Allergen Reactions  . Cefuroxime Axetil Anaphylaxis  . Ertapenem Other (See Comments)    Rash and confusion  . Morphine And Related Other (See Comments)    Changed mental status, confusion, headache, visual hallucination  . Penicillins Anaphylaxis and Other (See Comments)    ?can take amoxicillin?  . Sulfa Antibiotics Anaphylaxis, Shortness Of Breath and Other (See Comments)  . Tessalon [Benzonatate] Anaphylaxis  . Shellfish Allergy Itching and Other (See Comments)    Took benadryl to alleviate reaction    Patient Measurements: Height: 5\' 8"  (172.7 cm) (from 11/08/14) Weight: 130 lb 1.1 oz (59 kg) IBW/kg (Calculated) : 68.4  Vital Signs: Temp: 98.8 F (37.1 C) (03/14 1607) Temp Source: Oral (03/14 1607) BP: 108/77 mmHg (03/14 1607) Pulse Rate: 92 (03/14 1607) Intake/Output from previous day: 03/13 0701 - 03/14 0700 In: 1430 [P.O.:480; I.V.:350; IV Piggyback:600] Out: 3600 [Urine:2900; Stool:700]  Labs:  Recent Labs  12/18/14 0313 12/19/14 0335 12/20/14 0415  WBC 19.1*  --   --   HGB 7.4*  --   --   PLT 586*  --   --   CREATININE 0.84 0.77 0.76   Estimated Creatinine Clearance: 112.7 mL/min (by C-G formula based on Cr of 0.76).  Assessment:  29 y.o. male on Merrem D#4 and Flagyl D#6 for sacral decub, suspected new asp pna. Had been on Vanc for osteo.    Meropenem stopped due to concerned for recurrent halluninations; Aztreonam and Vanc resumed.  Last Vanc dose was on 3/11. Trough level supratherapeutic (39.8 mcg/ml) on 3/11, though had previously had a therapeutic trough (19.6 mcg/ml) on same dose of 1500 mg IV q24hrs.  BUN 26/Scr 0.95 on 3/11, now BUN 12/Scr 0.76.  Paraplegic and reduced muscle mass, so difficult to estimate crcl.   Vanc PTA >> 3/11; resume 3/14>>  Flagyl 2/27 >>   LVQ 2/27 >> 3/6  Merrem 2/25 >> 2/27; restart 3/11 > 3/14  Aztreonam 3/6 >> 3/11; resume 3/14>>  Fluconazole x 1 3/5   2/25 VT = 11.3 on 1gm q24 - inc to 1250mg  q24  3/1 VT = 11.8 on 1250mg  q24h - inc to 1500mg  q24  3/6: VT = 19.6  3/11 VT = 39.8   Goal of Therapy:  Vancomycin trough level 15-20 mcg/ml appropriate Azactam dose for renal function and infection  Plan:   Aztreonam 1 gram IV q8hrs.  Vancomycin 1250 mg IV q24hrs.  Follow renal function, progress.  Vancomycin trough level at steady-state.    Arty Baumgartner, [place] Pager: [phone] 12/20/2014,7:19 PM

## 2014-12-21 DIAGNOSIS — D473 Essential (hemorrhagic) thrombocythemia: Secondary | ICD-10-CM

## 2014-12-21 LAB — CBC
HCT: 23.7 % — ABNORMAL LOW (ref 39.0–52.0)
Hemoglobin: 7.4 g/dL — ABNORMAL LOW (ref 13.0–17.0)
MCH: 26.8 pg (ref 26.0–34.0)
MCHC: 31.2 g/dL (ref 30.0–36.0)
MCV: 85.9 fL (ref 78.0–100.0)
Platelets: 605 10*3/uL — ABNORMAL HIGH (ref 150–400)
RBC: 2.76 MIL/uL — AB (ref 4.22–5.81)
RDW: 17.5 % — ABNORMAL HIGH (ref 11.5–15.5)
WBC: 10.3 10*3/uL (ref 4.0–10.5)

## 2014-12-21 LAB — BASIC METABOLIC PANEL
Anion gap: 4 — ABNORMAL LOW (ref 5–15)
BUN: 11 mg/dL (ref 6–23)
CO2: 30 mmol/L (ref 19–32)
CREATININE: 0.57 mg/dL (ref 0.50–1.35)
Calcium: 9.4 mg/dL (ref 8.4–10.5)
Chloride: 106 mmol/L (ref 96–112)
GFR calc Af Amer: >90 mL/min (ref >90)
GLUCOSE: 131 mg/dL — AB (ref 70–99)
Potassium: 3.8 mmol/L (ref 3.5–5.1)
Sodium: 140 mmol/L (ref 135–145)

## 2014-12-21 LAB — GLUCOSE, CAPILLARY
GLUCOSE-CAPILLARY: 146 mg/dL — AB (ref 70–99)
GLUCOSE-CAPILLARY: 322 mg/dL — AB (ref 70–99)
GLUCOSE-CAPILLARY: 527 mg/dL — AB (ref 70–99)
Glucose-Capillary: 120 mg/dL — ABNORMAL HIGH (ref 70–99)

## 2014-12-21 LAB — GLUCOSE, RANDOM: GLUCOSE: 559 mg/dL — AB (ref 70–99)

## 2014-12-21 MED ORDER — GLUCERNA SHAKE PO LIQD
237.0000 mL | Freq: Three times a day (TID) | ORAL | Status: DC
  Administered 2014-12-21 (×2): 237 mL via ORAL

## 2014-12-21 MED ORDER — PRO-STAT SUGAR FREE PO LIQD
30.0000 mL | Freq: Two times a day (BID) | ORAL | Status: DC
  Administered 2014-12-21 – 2014-12-22 (×2): 30 mL via ORAL
  Filled 2014-12-21 (×4): qty 30

## 2014-12-21 MED ORDER — INSULIN ASPART 100 UNIT/ML ~~LOC~~ SOLN
7.0000 [IU] | Freq: Once | SUBCUTANEOUS | Status: AC
  Administered 2014-12-21: 7 [IU] via SUBCUTANEOUS

## 2014-12-21 NOTE — Progress Notes (Signed)
NUTRITION FOLLOW UP  Pt meets criteria for SEVERE MALNUTRITION in the context of chronic illness as evidenced by a 9.6% weight loss in 2 months and severe muscle mass loss.  DOCUMENTATION CODES Per approved criteria  -Severe Malnutrition in the context of chronic illness   INTERVENTION: Provide Glucerna Shake po TID, each supplement provides 220 kcal and 10 grams of protein  Provide 30 ml Prostat po BID, each supplement provides 100 kcal and 15 grams of protein.   Provide nourishment snacks ([place] sandwich, yogurt). Ordered.  Encourage adequate PO intake.   NUTRITION DIAGNOSIS: Increased nutrient needs related to wound healing as evidenced by estimated nutrition needs; ongoing  Goal: Pt to meet >/= 90% of their estimated nutrition needs; progressing  Monitor:  PO intake, weight trends, labs, I/O's  29 y.o. 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  Pt extubated 3/12. RD consulted for assessment of nutrition requirements/status. Pt is currently on a carb modified diet with meal completion of 40-80%. Pt reports appetite is fine. 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 RD to order Glucerna Shake and Prostat to aid in healing. Pt additionally requests nourishment snacks. RD to order. Pt was encouraged to eat his food at meals and to drink his supplements.  Labs and medications reviewed.  Height: Ht Readings from Last 1 Encounters:  12/02/14 5\' 8"  (1.727 m)    Weight: Wt Readings from Last 1 Encounters:  12/19/14 130 lb 1.1 oz (59 kg)    BMI:  Body mass index is 19.78 kg/(m^2).   Re-Estimated Nutritional Needs: Kcal: 2100-2300 Protein: 105- 120 grams Fluid: 2.1 - 2.3 L/day  Skin: +1  RLE edema Stage IV Pressure Ulcer: sacrum-8cm x 8cm x 0.5cm with undermining from 11-2 o'clock  Stage IV Pressure Ulcer: right ischial-7cm x 5cm x 2.5cm  Stage III Pressure Ulcer: left ischial- 3cm x 2cm x 0.2cm  Stage III Pressure Ulcer: left heel-1.0cm x 1.0cm x 0.1cm  Stage III Pressure Ulcer: left lateral foot-4cm x 1cm x 0.1cm  Stage III Pressure Ulcer: left middle finger 1cm x 1cm x 0.1cm but dry and crusted over  Diet Order: Diet - low sodium heart healthy Diet Carb Modified   Intake/Output Summary (Last 24 hours) at 12/21/14 1249 Last data filed at 12/21/14 1149  Gross per 24 hour  Intake   1470 ml  Output   3600 ml  Net  -2130 ml    Last BM: 3/15-ostomy  Labs:   Recent Labs Lab 12/17/14 0228 12/18/14 0313 12/19/14 0335 12/20/14 0415 12/21/14 0710  NA 141 144 140 138 140  K 4.7 3.6 3.7 4.2 3.8  CL 115* 115* 110 107 106  CO2 19 24 25 27 30   BUN 26* 23 16 12 11   CREATININE 0.95 0.84 0.77 0.76 0.57  CALCIUM 9.3 9.8 9.5 9.5 9.4  MG 1.4* 1.4* 1.9  --   --   PHOS 2.8 3.0  --   --   --   GLUCOSE 282* 222* 167* 225* 131*    CBG (last 3)   Recent Labs  12/20/14 2118 12/21/14 0742 12/21/14 1144  GLUCAP 226* 120* 146*    Scheduled Meds: . aztreonam  1 g Intravenous Q8H  . baclofen  10 mg Oral BID WC  . baclofen  20 mg Oral QHS  . enoxaparin (LOVENOX) injection  40 mg Subcutaneous Q24H  . feeding supplement (GLUCERNA SHAKE)  237 mL Oral TID BM  . feeding supplement (PRO-STAT SUGAR FREE 64)  30 mL Oral BID  . fentaNYL  25 mcg Transdermal Q72H  . insulin aspart  0-9 Units Subcutaneous TID WC  . insulin glargine  25 Units Subcutaneous QHS  . lidocaine  1 application Topical Once per day on Mon Wed Fri  . liver oil-zinc oxide   Topical BID  . metronidazole  500 mg Intravenous Q8H  . mirtazapine  15 mg Oral QHS  . mupirocin ointment  1 application Nasal Once per day on Mon Wed Fri  . pregabalin  100 mg Oral TID AC  . pregabalin  200 mg Oral QHS  .  sodium chloride  3 mL Intravenous Q12H  . vancomycin  1,250 mg Intravenous Q24H    Continuous Infusions: . sodium chloride 10 mL/hr at 12/21/14 0302  . lactated ringers 10 mL/hr at 12/19/14 1100    Past Medical History  Diagnosis Date  . GERD (gastroesophageal reflux disease)   . Asthma   . Hx MRSA infection     on face  . Gastroparesis   . Diabetic neuropathy   . Seizures   . Family history of anesthesia complication     Pt mother can't have epidural procedures  . Dysrhythmia   . Pneumonia   . Arthritis   . Fibromyalgia   . 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 Type I diabetes mellitus     sees Dr. Loanne Drilling     Past Surgical History  Procedure Laterality Date  . Tonsillectomy    . Multiple extractions with alveoloplasty N/A 08/03/2014    Procedure: MULTIPLE EXTRACTIONS;  Surgeon: Gae Bon, DDS;  Location: [place];  Service: Oral Surgery;  Laterality: N/A;  . Tee without cardioversion N/A 08/17/2014    Procedure: TRANSESOPHAGEAL ECHOCARDIOGRAM (TEE);  Surgeon: Dorothy Spark, MD;  [facility] END[facility]COPY;  Service: Cardiovascular;  Laterality: N/A;  . Debridment of decubitus ulcer N/A 10/04/2014    Procedure: DEBRIDMENT OF DECUBITUS ULCER;  Surgeon: Georganna Skeans, MD;  Location: [place];  Service: General;  Laterality: N/A;  . Laparoscopic diverted colostomy N/A 10/12/2014    Procedure: LAPAROSCOPIC DIVERTING COLOSTOMY;  Surgeon: Donnie Mesa, MD;  Location: [place];  Service: General;  Laterality: N/A;  . Insertion of suprapubic catheter N/A 10/12/2014    Procedure: INSERTION OF SUPRAPUBIC CATHETER;  Surgeon: Reece Packer, MD;  Location: [place];  Service: Urology;  Laterality: N/A;  . Incision and drainage of wound N/A 11/12/2014    Procedure: IRRIGATION AND DEBRIDEMENT OF WOUNDS WITH BONE BIOPSY AND SURGICAL PREP ;  Surgeon: Theodoro Kos, DO;  Location: [place];  Service: Plastics;  Laterality: N/A;  . Application of a-cell of back N/A 11/12/2014     Procedure: APPLICATION A CELL AND VAC ;  Surgeon: Theodoro Kos, DO;  Location: [place];  Service: Plastics;  Laterality: N/A;  . Incision and drainage of wound N/A 11/18/2014    Procedure: IRRIGATION AND DEBRIDEMENT OF SACRAL ULCER ONLY WITH PLACEMENT OF A CELL AND VAC/ DRESSING CHANGE TO UPPER BACK AREA.;  Surgeon: Theodoro Kos, DO;  Location: [place];  Service: Plastics;  Laterality: N/A;  . Incision and drainage of wound N/A 11/25/2014    Procedure: IRRIGATION AND DEBRIDEMENT OF SACRAL ULCER AND BACK BURN WITH PLACEMENT OF A-CELL;  Surgeon: Lyndee Leo  Sanger, DO;  Location: [place];  Service: Clinical cytogeneticist;  Laterality: N/A;  . Minor application of wound vac N/A 11/25/2014    Procedure:  WOUND VAC CHANGE;  Surgeon: Theodoro Kos, DO;  Location: [place];  Service: Plastics;  Laterality: N/A;  . Incision and drainage of wound Right 12/08/2014    Procedure: IRRIGATION AND DEBRIDEMENT SACRAL WOUND AND RIGHT ISCHIAL WOUND ;  Surgeon: Theodoro Kos, DO;  Location: [place];  Service: Plastics;  Laterality: Right;  . Application of a-cell of back N/A 12/08/2014    Procedure: APPLICATION OF A-CELL AND WOUND VAC ;  Surgeon: Theodoro Kos, DO;  Location: [place];  Service: Plastics;  Laterality: N/A;    Kallie Locks, MS, RD, LDN Pager # [phone] After hours/ weekend pager # [phone]

## 2014-12-21 NOTE — Progress Notes (Signed)
    [place] for Infectious Disease  Date of Admission:  12/01/2014  Antibiotics: total abtx day #41 Day #8 of Vancomycin,  aztreonam,  flagyl  Subjective: tmax 100.4 yesterday, but mainly afebrile. He is no longer confused per his mother, and appropriate this morning. He just underwent wound care evaluation and treatment by wound care RN.  Objective: Temp:  [97.5 F (36.4 C)-99.9 F (37.7 C)] 97.5 F (36.4 C) (03/15 1146) Pulse Rate:  [72-93] 72 (03/15 1200) Resp:  [13-25] 21 (03/15 1200) BP: (94-127)/(56-85) 96/62 mmHg (03/15 1200) SpO2:  [89 %-100 %] 100 % (03/15 1200)  General: alert, answering questions appropriately Skin: no rashes Lungs: course rhonchi bilaterally anterior lung fields Cor: RRR, no g/m/r Abdomen: soft, nt, nd Ext: wound on left leg, and bilateral hip covered. Please see Dr. Algis Liming notes for photos of wounds   Lab Results Lab Results  Component Value Date   WBC 10.3 12/21/2014   HGB 7.4* 12/21/2014   HCT 23.7* 12/21/2014   MCV 85.9 12/21/2014   PLT 605* 12/21/2014    Lab Results  Component Value Date   CREATININE 0.57 12/21/2014   BUN 11 12/21/2014   NA 140 12/21/2014   K 3.8 12/21/2014   CL 106 12/21/2014   CO2 30 12/21/2014    Lab Results  Component Value Date   ALT 13 12/12/2014   AST 16 12/12/2014   ALKPHOS 83 12/12/2014   BILITOT 0.7 12/12/2014      Microbiology: Recent Results (from the past 240 hour(s))  Clostridium Difficile by PCR     Status: None   Collection Time: 12/12/14  4:22 AM  Result Value Ref Range Status   C difficile by pcr NEGATIVE NEGATIVE Final  MRSA PCR Screening     Status: None   Collection Time: 12/12/14  3:27 PM  Result Value Ref Range Status   MRSA by PCR NEGATIVE NEGATIVE Final    Comment:        The GeneXpert MRSA Assay (FDA approved for NASAL specimens only), is one component of a comprehensive MRSA colonization surveillance program. It is not intended to diagnose MRSA infection nor to  guide or monitor treatment for MRSA infections.     Studies/Results: No results found.  Assessment/Plan:  1) fevers with leukocytosis = improved after switching to meropenem, however now concerned for recurrent hallucinations. We will switch him back to  vancomycin, aztreonam, plus metronidazole to cover ischial osteomyelitis as well as aspiration pneumonia. Recommend to treat for addn 2 more weeks for a total of 8 wks. Then convert to oral regimen if needed  2) multiple decubitus ulcers and ischial osteomyelitis = continue with antibiotic regimen of vanco, aztreonam plus oral metronidazole. Will treat for a total of 8 wks, currently on 41 of 56 days.   3) aspiration pneumonia = now extubated, recommend RT to evaluate for vibratory vest to help with bringing up sputum  4) thrombocytosis = sign of underlying inflammation from infection. Will continue to monitor   Carlyle Basques, [place] for Infectious Disease [place] [email or web address] [phone] pager   [phone] cell 12/21/2014, 1:08 PM

## 2014-12-21 NOTE — Consult Note (Signed)
WOC wound follow up  Pt has been followed by [facility] for left foot wounds and left finger wounds.  I have discussed case with Plastics (Dr. Migdalia Dk) and they do not plan any further OR debridements at this time an no further placement of ACell, for this reason they have asked me to evaluated the wounds today and determine wound care order needs. All of patient's wound examined today with hospitalist at bedside.  Dr. Algis Liming has taken images of the wounds to place in the patient's record.  Wound type/ Measurement Stage IV Pressure Ulcer: sacrum-8cm x 8cm x 0.5cm with undermining from 11-2 o'clock  Stage IV Pressure Ulcer: right ischial-7cm x 5cm x 2.5cm  Stage III Pressure Ulcer: left ischial- 3cm x 2cm x 0.2cm  Stage III Pressure Ulcer: left heel-1.0cm x 1.0cm x 0.1cm  Stage III Pressure Ulcer: left lateral foot-4cm x 1cm x 0.1cm  Stage III Pressure Ulcer: left middle finger 1cm x 1cm x 0.1cm but dry and crusted over Unroofed bulla secondary to medication reaction: 15cm x 11cm x 0.1cm Wound bed: see flow sheet documentation from [facility] nurse Drainage (amount, consistency, odor) see flow sheet  Periwound:see flow sheet Dressing procedure/placement/frequency: Foam dressing to the left foot wounds, change every 3 days Vaseline gauze to the back wound, cover with ABD pad, change every other day Left finger leave open to air Sacrum/right and left ischium hydrogel, fluff gauze, change daily. Continue LALM for pressure redistribution.  Continue Prevalon boots bilaterally for offloading.  [place] [facility] team will follow along with you for weekly wound assessments.  Please notify me of any acute changes in the wounds or any new areas of concerns Para March RN,CWOCN [phone]

## 2014-12-21 NOTE — Progress Notes (Signed)
PROGRESS NOTE    Jason [name]:[id number] DOB: 12-22-1985 DOA: 12/01/2014 PCP: [name], MD  HPI/Brief narrative 29 year old male with a history of spinal cord infarct and paraplegia who has had multiple recent infections was hospitalized on 12/01/2014 for hallucinations believed to be related to meropenem. His mental status improved but he developed worsening sepsis and recurrent altered mental status. He was intubated for respiratory decompensation and transferred to ICU on 12/14/14. After stabilization, transferred back to SDU & [place] service on 3/14.   Assessment/Plan:  1. Acute respiratory failure with hypoxia : Due to atelectasis, possible HCAP & impaired cough mechanics due to quadriplegic status. Extubated 3/12. Pulmonary hygiene. Stable. 2. Severe sepsis: Secondary to suspected aspiration pneumonia, chronic wound infection and osteomyelitis of the ischium: Antibiotics per ID.  Sepsis physiology resolved. 3. Suspected aspiration pneumonia: Continue Flagyl. Meropenem discontinued due to concerns for hallucinations. Started on vancomycin and aztreonam 3/14 to cover for PNA and wound infections 4. 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 Status post debridement 3/2 5. Chronic hypotension: Continue midodrine 6. Chronic suprapubic catheter :  7. Hypokalemia: Replaced 8. Severe Protein calorie malnutrition: Mx per dietitian recommendation. 9. Chronic anemia: Without evidence of bleeding. S/p 1 unit of PRBC on 3/8. Stable. Transfuse for hemoglobin of less than 7 g per DL.  10. Uncontrolled DM 1: Adjust insulins and monitor. Fluctuating.  11. Acute encephalopathy: Resolved. Minimize sedatives and opioids. 12. Neurogenic bladder: Now suprapubic catheter. 13. Spinal cord infarction and quadriplegia: 14. S/p Diverting Colostomy. 15. Reactive thrombocytosis   Code Status: Full Family Communication: Discussed with mother on  3/14 Disposition Plan: DC home when medically stable   SIGNIFICANT EVENTS: 3/02 debridement of decubitus ulcers and wound VAC 3/07 SDU orders 3/08 Respiratory decompensation, intubated  3/08 one unit PRBCs for Hgb 6.4 3/09 Stable on vent, no distress  3/12 Extubated  3/13 transfer to SDU ordered   Consultants:  CCM- signed off   Infectious disease  Urology  Plastic surgery  Procedures:  S/p mechanical ventilation  Antibiotics:  Meropenem-DC'd   Metronidazole  Vancomycin  Aztreonam  Subjective: Denies complaints.  Objective: Filed Vitals:   12/21/14 1500 12/21/14 1553 12/21/14 1600 12/21/14 1700  BP: 107/70 107/70 99/80 109/66  Pulse: 81 82 81   Temp:  98.4 F (36.9 C)    TempSrc:  Oral    Resp: 22 21 27 20   Height:      Weight:      SpO2: 100% 99% 100%     Intake/Output Summary (Last 24 hours) at 12/21/14 1741 Last data filed at 12/21/14 1523  Gross per 24 hour  Intake   1100 ml  Output   4000 ml  Net  -2900 ml   Filed Weights   12/17/14 0258 12/18/14 0406 12/19/14 0500  Weight: 59.2 kg (130 lb 8.2 oz) 57 kg (125 lb 10.6 oz) 59 kg (130 lb 1.1 oz)     Exam:  General exam: pleasant young male sitting up comfortably in bed.  Respiratory system: slightly diminished breath sounds in the bases but otherwise clear to auscultation. No increased work of breathing. Cardiovascular system: S1 & S2 heard, RRR. No JVD, murmurs, gallops, clicks or pedal edema. telemetry: Sinus rhythm.  Gastrointestinal system: Abdomen is nondistended, soft and nontender. Normal bowel sounds heard. Diverting colostomy with small liquid brown stools in bag. Suprapubic catheter site intact. Central nervous system: Alert and oriented. No focal neurological deficits. Extremities: chronic quadriparesis. Skin: as in pictures below -  examined with WOC.   Sacral   Sacral  Back    Left heel  left foot     Data Reviewed: Basic Metabolic Panel:  Recent  Labs Lab 12/17/14 0228 12/18/14 0313 12/19/14 0335 12/20/14 0415 12/21/14 0710  NA 141 144 140 138 140  K 4.7 3.6 3.7 4.2 3.8  CL 115* 115* 110 107 106  CO2 19 24 25 27 30   GLUCOSE 282* 222* 167* 225* 131*  BUN 26* 23 16 12 11   CREATININE 0.95 0.84 0.77 0.76 0.57  CALCIUM 9.3 9.8 9.5 9.5 9.4  MG 1.4* 1.4* 1.9  --   --   PHOS 2.8 3.0  --   --   --    Liver Function Tests: No results for input(s): AST, ALT, ALKPHOS, BILITOT, PROT, ALBUMIN in the last 168 hours. No results for input(s): LIPASE, AMYLASE in the last 168 hours. No results for input(s): AMMONIA in the last 168 hours. CBC:  Recent Labs Lab 12/15/14 0607 12/16/14 0509 12/17/14 0228 12/18/14 0313 12/21/14 0710  WBC 24.6* 21.0* 24.4* 19.1* 10.3  HGB 7.9* 7.7* 6.9* 7.4* 7.4*  HCT 24.1* 22.7* 21.6* 23.2* 23.7*  MCV 82.3 81.7 85.0 83.8 85.9  PLT 499* 497* 529* 586* 605*   Cardiac Enzymes: No results for input(s): CKTOTAL, CKMB, CKMBINDEX, TROPONINI in the last 168 hours. BNP (last 3 results)  Recent Labs  01/29/14 2113  PROBNP 1343.0*   CBG:  Recent Labs Lab 12/20/14 1606 12/20/14 2118 12/21/14 0742 12/21/14 1144 12/21/14 1551  GLUCAP 197* 226* 120* 146* 322*    Recent Results (from the past 240 hour(s))  Clostridium Difficile by PCR     Status: None   Collection Time: 12/12/14  4:22 AM  Result Value Ref Range Status   C difficile by pcr NEGATIVE NEGATIVE Final  MRSA PCR Screening     Status: None   Collection Time: 12/12/14  3:27 PM  Result Value Ref Range Status   MRSA by PCR NEGATIVE NEGATIVE Final    Comment:        The GeneXpert MRSA Assay (FDA approved for NASAL specimens only), is one component of a comprehensive MRSA colonization surveillance program. It is not intended to diagnose MRSA infection nor to guide or monitor treatment for MRSA infections.           Studies: No results found.      Scheduled Meds: . aztreonam  1 g Intravenous Q8H  . baclofen  10 mg Oral  BID WC  . baclofen  20 mg Oral QHS  . enoxaparin (LOVENOX) injection  40 mg Subcutaneous Q24H  . feeding supplement (GLUCERNA SHAKE)  237 mL Oral TID BM  . feeding supplement (PRO-STAT SUGAR FREE 64)  30 mL Oral BID  . fentaNYL  25 mcg Transdermal Q72H  . insulin aspart  0-9 Units Subcutaneous TID WC  . insulin glargine  25 Units Subcutaneous QHS  . lidocaine  1 application Topical Once per day on Mon Wed Fri  . liver oil-zinc oxide   Topical BID  . metronidazole  500 mg Intravenous Q8H  . mirtazapine  15 mg Oral QHS  . mupirocin ointment  1 application Nasal Once per day on Mon Wed Fri  . pregabalin  100 mg Oral TID AC  . pregabalin  200 mg Oral QHS  . sodium chloride  3 mL Intravenous Q12H  . vancomycin  1,250 mg Intravenous Q24H   Continuous Infusions: . sodium chloride 10 mL/hr at 12/21/14 0302  .  lactated ringers 10 mL/hr at 12/19/14 1100    Principal Problem:   Acute encephalopathy Active Problems:   Type 1 diabetes, uncontrolled, with neuropathy   Asthma   GERD   Gastroparesis due to DM   Protein-calorie malnutrition, severe   Spinal cord infarction (history of)   Tetraplegia   Neurogenic bladder   UTI (urinary tract infection)   Sacral pressure sore   Stage IV decubitus ulcer   Osteomyelitis, pelvis   Anemia   Burkholderia cepacia infection   Disorientation   PICC (peripherally inserted central catheter) in place   Temperature elevated   Acute respiratory failure    Time spent: 30 minutes.    Vernell Leep, MD, FACP, FHM. [facility] Pager [phone]  If 7PM-7AM, please contact night-coverage [email or web address] Password [email or web address] 12/21/2014, 5:41 PM    LOS: 20 days

## 2014-12-22 ENCOUNTER — Inpatient Hospital Stay: Payer: Medicaid Other | Admitting: Infectious Disease

## 2014-12-22 DIAGNOSIS — D72829 Elevated white blood cell count, unspecified: Secondary | ICD-10-CM | POA: Diagnosis present

## 2014-12-22 DIAGNOSIS — E08621 Diabetes mellitus due to underlying condition with foot ulcer: Secondary | ICD-10-CM

## 2014-12-22 DIAGNOSIS — L97529 Non-pressure chronic ulcer of other part of left foot with unspecified severity: Secondary | ICD-10-CM

## 2014-12-22 LAB — GLUCOSE, CAPILLARY
GLUCOSE-CAPILLARY: 390 mg/dL — AB (ref 70–99)
Glucose-Capillary: 442 mg/dL — ABNORMAL HIGH (ref 70–99)
Glucose-Capillary: 267 mg/dL — ABNORMAL HIGH (ref 70–99)
Glucose-Capillary: 212 mg/dL — ABNORMAL HIGH (ref 70–99)
Glucose-Capillary: 121 mg/dL — ABNORMAL HIGH (ref 70–99)
Glucose-Capillary: 120 mg/dL — ABNORMAL HIGH (ref 70–99)
Glucose-Capillary: 151 mg/dL — ABNORMAL HIGH (ref 70–99)

## 2014-12-22 LAB — BASIC METABOLIC PANEL
Anion gap: 4 — ABNORMAL LOW (ref 5–15)
BUN: 15 mg/dL (ref 6–23)
CO2: 27 mmol/L (ref 19–32)
Calcium: 9.6 mg/dL (ref 8.4–10.5)
Chloride: 104 mmol/L (ref 96–112)
Creatinine, Ser: 0.69 mg/dL (ref 0.50–1.35)
GFR calc Af Amer: >90 mL/min (ref >90)
GLUCOSE: 228 mg/dL — AB (ref 70–99)
POTASSIUM: 4 mmol/L (ref 3.5–5.1)
SODIUM: 135 mmol/L (ref 135–145)

## 2014-12-22 LAB — CBC
HCT: 24.1 % — ABNORMAL LOW (ref 39.0–52.0)
Hemoglobin: 7.5 g/dL — ABNORMAL LOW (ref 13.0–17.0)
MCH: 26.7 pg (ref 26.0–34.0)
MCHC: 31.1 g/dL (ref 30.0–36.0)
MCV: 85.8 fL (ref 78.0–100.0)
PLATELETS: 627 10*3/uL — AB (ref 150–400)
RBC: 2.81 MIL/uL — ABNORMAL LOW (ref 4.22–5.81)
RDW: 17.3 % — AB (ref 11.5–15.5)
WBC: 10.5 10*3/uL (ref 4.0–10.5)

## 2014-12-22 MED ORDER — AZTREONAM 1 G IJ SOLR
Freq: Three times a day (TID) | INTRAMUSCULAR | Status: DC
Start: 2014-12-22 — End: 2014-12-24
  Administered 2014-12-22 – 2014-12-24 (×6): via INTRAVENOUS
  Filled 2014-12-22 (×9): qty 50

## 2014-12-22 MED ORDER — INSULIN ASPART 100 UNIT/ML ~~LOC~~ SOLN
5.0000 [IU] | Freq: Once | SUBCUTANEOUS | Status: AC
  Administered 2014-12-22: 5 [IU] via SUBCUTANEOUS

## 2014-12-22 MED ORDER — INSULIN ASPART 100 UNIT/ML ~~LOC~~ SOLN
7.0000 [IU] | Freq: Once | SUBCUTANEOUS | Status: AC
  Administered 2014-12-22: 7 [IU] via SUBCUTANEOUS

## 2014-12-22 MED ORDER — PRO-STAT SUGAR FREE PO LIQD
60.0000 mL | Freq: Two times a day (BID) | ORAL | Status: DC
  Administered 2014-12-22 – 2014-12-24 (×4): 60 mL via ORAL
  Filled 2014-12-22 (×5): qty 60

## 2014-12-22 MED ORDER — INSULIN ASPART 100 UNIT/ML ~~LOC~~ SOLN: 10.0000 [IU] | Freq: Once | SUBCUTANEOUS | Status: DC

## 2014-12-22 NOTE — Progress Notes (Signed)
PROGRESS NOTE    Jason [name] DJS:[id number] DOB: 1986/03/04 DOA: 12/01/2014 PCP: [name], MD  HPI/Brief narrative 29 year old male with a history of spinal cord infarct and paraplegia who has had multiple recent infections was hospitalized on 12/01/2014 for hallucinations believed to be related to meropenem. His mental status improved but he developed worsening sepsis and recurrent altered mental status. He was intubated for respiratory decompensation and transferred to ICU on 12/14/14. After stabilization, transferred back to SDU & [place] service on 3/14.  Subjective: Sick complains about pain in his bottom and his back. Appears to be oriented and back to baseline in terms of mentation. Has had temperature of 100.7 this morning, continue current antibiotics  Assessment/Plan:  Acute respiratory failure with hypoxia :  Due to atelectasis, possible HCAP & impaired cough mechanics due to quadriplegic status. Extubated 3/12. Pulmonary hygiene. Stable.  Severe sepsis: Secondary to suspected aspiration pneumonia, chronic wound infection and osteomyelitis of the ischium: Antibiotics per ID.  Sepsis physiology resolved.  Suspected aspiration pneumonia:  Continue Flagyl. Meropenem discontinued due to concerns for hallucinations. 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 Status post debridement 3/2  Chronic hypotension: Continue midodrine  Chronic suprapubic catheter :   Hypokalemia: Replaced  Severe Protein calorie malnutrition: Mx per dietitian recommendation.  Chronic anemia: Without evidence of bleeding. S/p 1 unit of PRBC on 3/8. Stable. Transfuse for hemoglobin of less than 7 g per DL.   Uncontrolled DM 1:  Adjust insulins and monitor. Fluctuating.  Patient is not following carbohydrate modified diet, getting 50 mL of IV dextrose with the aztreonam  every 8 hours  Acute encephalopathy: Resolved. Minimize sedatives and opioids.  Neurogenic bladder: Suprapubic catheter.  Spinal cord infarction and quadriplegia:  S/p Diverting Colostomy.  Reactive thrombocytosis   Code Status: Full Family Communication: Discussed with mother on 3/14 Disposition Plan: DC home when medically stable   SIGNIFICANT EVENTS: 3/02 debridement of decubitus ulcers and wound VAC 3/07 SDU orders 3/08 Respiratory decompensation, intubated  3/08 one unit PRBCs for Hgb 6.4 3/09 Stable on vent, no distress  3/12 Extubated  3/13 transfer to SDU ordered   Consultants:  CCM- signed off   Infectious disease  Urology  Plastic surgery  Procedures:  S/p mechanical ventilation  Antibiotics:  Meropenem-DC'd   Metronidazole  Vancomycin  Aztreonam   Objective: Filed Vitals:   12/22/14 0400 12/22/14 0819 12/22/14 1000 12/22/14 1035  BP: 109/66 95/62 88/76  105/65  Pulse: 90 89 90 90  Temp: 99.1 F (37.3 C) 100.7 F (38.2 C)    TempSrc: Oral Oral    Resp: 23 22 27 16   Height:      Weight:      SpO2: 98% 97% 98% 100%    Intake/Output Summary (Last 24 hours) at 12/22/14 1138 Last data filed at 12/22/14 0820  Gross per 24 hour  Intake    890 ml  Output   4400 ml  Net  -3510 ml   Filed Weights   12/17/14 0258 12/18/14 0406 12/19/14 0500  Weight: 59.2 kg (130 lb 8.2 oz) 57 kg (125 lb 10.6 oz) 59 kg (130 lb 1.1 oz)     Exam:  General exam: pleasant young male sitting up comfortably in bed.  Respiratory system: slightly diminished breath sounds in the bases but otherwise clear to auscultation. No increased work of breathing. Cardiovascular system: S1 & S2 heard, RRR. No  JVD, murmurs, gallops, clicks or pedal edema. telemetry: Sinus rhythm.  Gastrointestinal system: Abdomen is nondistended, soft and nontender. Normal bowel sounds heard. Diverting colostomy with small liquid brown stools in bag. Suprapubic catheter site  intact. Central nervous system: Alert and oriented. No focal neurological deficits. Extremities: chronic quadriparesis. Skin see previous exam from Dr. Janelle Floor noted.  Data Reviewed: Basic Metabolic Panel:  Recent Labs Lab 12/17/14 0228 12/18/14 0313 12/19/14 0335 12/20/14 0415 12/21/14 0710 12/21/14 2229 12/22/14 0810  NA 141 144 140 138 140  --  135  K 4.7 3.6 3.7 4.2 3.8  --  4.0  CL 115* 115* 110 107 106  --  104  CO2 19 24 25 27 30   --  27  GLUCOSE 282* 222* 167* 225* 131* 559* 228*  BUN 26* 23 16 12 11   --  15  CREATININE 0.95 0.84 0.77 0.76 0.57  --  0.69  CALCIUM 9.3 9.8 9.5 9.5 9.4  --  9.6  MG 1.4* 1.4* 1.9  --   --   --   --   PHOS 2.8 3.0  --   --   --   --   --    Liver Function Tests: No results for input(s): AST, ALT, ALKPHOS, BILITOT, PROT, ALBUMIN in the last 168 hours. No results for input(s): LIPASE, AMYLASE in the last 168 hours. No results for input(s): AMMONIA in the last 168 hours. CBC:  Recent Labs Lab 12/16/14 0509 12/17/14 0228 12/18/14 0313 12/21/14 0710 12/22/14 0810  WBC 21.0* 24.4* 19.1* 10.3 10.5  HGB 7.7* 6.9* 7.4* 7.4* 7.5*  HCT 22.7* 21.6* 23.2* 23.7* 24.1*  MCV 81.7 85.0 83.8 85.9 85.8  PLT 497* 529* 586* 605* 627*   Cardiac Enzymes: No results for input(s): CKTOTAL, CKMB, CKMBINDEX, TROPONINI in the last 168 hours. BNP (last 3 results)  Recent Labs  01/29/14 2113  PROBNP 1343.0*   CBG:  Recent Labs Lab 12/21/14 2131 12/22/14 0054 12/22/14 0307 12/22/14 0628 12/22/14 0813  GLUCAP 527* 442* 390* 267* 212*    Recent Results (from the past 240 hour(s))  MRSA PCR Screening     Status: None   Collection Time: 12/12/14  3:27 PM  Result Value Ref Range Status   MRSA by PCR NEGATIVE NEGATIVE Final    Comment:        The GeneXpert MRSA Assay (FDA approved for NASAL specimens only), is one component of a comprehensive MRSA colonization surveillance program. It is not intended to diagnose MRSA infection nor to  guide or monitor treatment for MRSA infections.           Studies: No results found.      Scheduled Meds: . aztreonam  1 g Intravenous Q8H  . baclofen  10 mg Oral BID WC  . baclofen  20 mg Oral QHS  . enoxaparin (LOVENOX) injection  40 mg Subcutaneous Q24H  . feeding supplement (GLUCERNA SHAKE)  237 mL Oral TID BM  . feeding supplement (PRO-STAT SUGAR FREE 64)  30 mL Oral BID  . fentaNYL  25 mcg Transdermal Q72H  . insulin aspart  0-9 Units Subcutaneous TID WC  . insulin glargine  25 Units Subcutaneous QHS  . lidocaine  1 application Topical Once per day on Mon Wed Fri  . liver oil-zinc oxide   Topical BID  . metronidazole  500 mg Intravenous Q8H  . mirtazapine  15 mg Oral QHS  . mupirocin ointment  1 application Nasal Once per day on Mon Wed  Fri  . pregabalin  100 mg Oral TID AC  . pregabalin  200 mg Oral QHS  . sodium chloride  3 mL Intravenous Q12H  . vancomycin  1,250 mg Intravenous Q24H   Continuous Infusions: . sodium chloride 10 mL/hr at 12/22/14 0615  . lactated ringers 10 mL/hr at 12/19/14 1100    Principal Problem:   Acute encephalopathy Active Problems:   Type 1 diabetes, uncontrolled, with neuropathy   Asthma   GERD   Gastroparesis due to DM   Protein-calorie malnutrition, severe   Spinal cord infarction (history of)   Tetraplegia   Neurogenic bladder   UTI (urinary tract infection)   Sacral pressure sore   Stage IV decubitus ulcer   Osteomyelitis, pelvis   Anemia   Burkholderia cepacia infection   Disorientation   PICC (peripherally inserted central catheter) in place   Temperature elevated   Acute respiratory failure   Leukocytosis    Time spent: 30 minutes.    Birdie Hopes, MD [facility] Pager [phone]  If 7PM-7AM, please contact night-coverage [email or web address] Password [email or web address] 12/22/2014, 11:38 AM    LOS: 21 days

## 2014-12-22 NOTE — Progress Notes (Signed)
Patient ID: Jason [name], male   DOB: 21-May-1986, 29 y.o.   MRN: [id number]         [facility]    Date of Admission:  12/01/2014   Total days of antibiotics 45        Day 19 metronidazole        Day 3 aztreonam        Day 3 vancomycin  Principal Problem:   Acute encephalopathy Active Problems:   Type 1 diabetes, uncontrolled, with neuropathy   Asthma   GERD   Gastroparesis due to DM   Protein-calorie malnutrition, severe   Spinal cord infarction (history of)   Tetraplegia   Neurogenic bladder   UTI (urinary tract infection)   Sacral pressure sore   Stage IV decubitus ulcer   Osteomyelitis, pelvis   Anemia   Burkholderia cepacia infection   Disorientation   PICC (peripherally inserted central catheter) in place   Temperature elevated   Acute respiratory failure   Leukocytosis   . aztreonam  1 g Intravenous Q8H  . baclofen  10 mg Oral BID WC  . baclofen  20 mg Oral QHS  . enoxaparin (LOVENOX) injection  40 mg Subcutaneous Q24H  . feeding supplement (GLUCERNA SHAKE)  237 mL Oral TID BM  . feeding supplement (PRO-STAT SUGAR FREE 64)  30 mL Oral BID  . fentaNYL  25 mcg Transdermal Q72H  . insulin aspart  0-9 Units Subcutaneous TID WC  . insulin glargine  25 Units Subcutaneous QHS  . lidocaine  1 application Topical Once per day on Mon Wed Fri  . liver oil-zinc oxide   Topical BID  . metronidazole  500 mg Intravenous Q8H  . mirtazapine  15 mg Oral QHS  . mupirocin ointment  1 application Nasal Once per day on Mon Wed Fri  . pregabalin  100 mg Oral TID AC  . pregabalin  200 mg Oral QHS  . sodium chloride  3 mL Intravenous Q12H  . vancomycin  1,250 mg Intravenous Q24H    Subjective: He states that he is feeling better today.  Review of Systems: Pertinent items are noted in HPI.  Past Medical History  Diagnosis Date  . GERD (gastroesophageal reflux disease)   . Asthma   . Hx MRSA infection     on face  . Gastroparesis   .  Diabetic neuropathy   . Seizures   . Family history of anesthesia complication     Pt mother can't have epidural procedures  . Dysrhythmia   . Pneumonia   . Arthritis   . Fibromyalgia   . 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 Type I diabetes mellitus     sees Dr. Loanne Drilling     History  Substance Use Topics  . Smoking status: Former Smoker -- 12 years    Types: Cigars, Cigarettes    Quit date: 09/07/2014  . Smokeless tobacco: Never Used     Comment: e-cigarette and cigars currently  . Alcohol Use: No     Comment: rarely    Family History  Problem Relation Age of Onset  . Diabetes Father   . Hypertension Father   . Asthma      fhx  . Hypertension      fhx  . Stroke      fhx  . Heart disease Mother    Allergies  Allergen Reactions  . Cefuroxime Axetil Anaphylaxis  . Ertapenem Other (See  Comments)    Rash and confusion  . Morphine And Related Other (See Comments)    Changed mental status, confusion, headache, visual hallucination  . Penicillins Anaphylaxis and Other (See Comments)    ?can take amoxicillin?  . Sulfa Antibiotics Anaphylaxis, Shortness Of Breath and Other (See Comments)  . Tessalon [Benzonatate] Anaphylaxis  . Shellfish Allergy Itching and Other (See Comments)    Took benadryl to alleviate reaction    OBJECTIVE: Blood pressure 105/65, pulse 90, temperature 100.7 F (38.2 C), temperature source Oral, resp. rate 16, height 5\' 8"  (1.727 m), weight 130 lb 1.1 oz (59 kg), SpO2 100 %. General: Alert and comfortable. He does not appear confused Skin: No rash Lungs: Clear Cor: Regular S1 and S2 with no murmur Abdomen: Soft and nontender. Colostomy and suprapubic catheter in place Wounds: Wound care notes and pictures from yesterday's exams noted  Lab Results Lab Results  Component Value Date   WBC 10.5 12/22/2014   HGB 7.5* 12/22/2014   HCT 24.1* 12/22/2014   MCV 85.8 12/22/2014   PLT 627* 12/22/2014    Lab Results    Component Value Date   CREATININE 0.69 12/22/2014   BUN 15 12/22/2014   NA 135 12/22/2014   K 4.0 12/22/2014   CL 104 12/22/2014   CO2 27 12/22/2014    Lab Results  Component Value Date   ALT 13 12/12/2014   AST 16 12/12/2014   ALKPHOS 83 12/12/2014   BILITOT 0.7 12/12/2014     Microbiology: Recent Results (from the past 240 hour(s))  MRSA PCR Screening     Status: None   Collection Time: 12/12/14  3:27 PM  Result Value Ref Range Status   MRSA by PCR NEGATIVE NEGATIVE Final    Comment:        The GeneXpert MRSA Assay (FDA approved for NASAL specimens only), is one component of a comprehensive MRSA colonization surveillance program. It is not intended to diagnose MRSA infection nor to guide or monitor treatment for MRSA infections.     Assessment: He continues to have some low-grade fever but it appears that his mental status has improved off of meropenem.  Plan: 1. Continue current antimicrobial regimen  Michel Bickers, MD [facility] [place] [phone] pager   [phone] cell 12/22/2014, 11:16 AM

## 2014-12-22 NOTE — Progress Notes (Signed)
CRITICAL VALUE ALERT  Critical value received:  Glucose 559  Date of notification:  12/21/14  Time of notification:  2345  Critical value read back:YES  Nurse who received alert:  Sherryl Manges  MD notified (1st page):  Alene Mires  Time of first page:  0004  MD notified (2nd page):  Time of second page:  Responding MD:    Time MD responded:

## 2014-12-22 NOTE — Progress Notes (Signed)
Inpatient Diabetes Program Recommendations  AACE/ADA: New Consensus Statement on Inpatient Glycemic Control (2013)  Target Ranges:  Prepandial:   less than 140 mg/dL      Peak postprandial:   less than 180 mg/dL (1-2 hours)      Critically ill patients:  140 - 180 mg/dL   Reason for Assessment:  Results for Jason [name], Jason [name] (MRN [id number]) as of 12/22/2014 10:24  Ref. Range 12/21/2014 07:42 12/21/2014 11:44 12/21/2014 15:51 12/21/2014 21:31 12/22/2014 00:54 12/22/2014 03:07 12/22/2014 06:28 12/22/2014 08:13  Glucose-Capillary Latest Range: 70-99 mg/dL 120 (H) 146 (H) 322 (H) 527 (H) 442 (H) 390 (H) 267 (H) 212 (H)    Diabetes history: Type 1 diabetes Outpatient Diabetes medications: Novolog 4 units tid with meals, Lantus 25 units daily Current orders for Inpatient glycemic control:  Novolog sensitive tid with meals, Lantus 25 units q HS  Note that CBG went up greater than 500 mg/dL last PM.  Due to Type 1 diabetes patient will need carbohydrate coverage. May consider adding Novolog 4 units tid with meals-Hold if patient eats less than 50%.  Thanks, Adah Perl, RN, BC-ADM Inpatient Diabetes Coordinator Pager [phone]

## 2014-12-22 NOTE — Progress Notes (Addendum)
Shift Event: CBGs elevated in 400s despite Novolog 7U X2, and scheduled Lantus.  Will give 5U Novolog.   Lacy Duverney [facility]

## 2014-12-23 DIAGNOSIS — D72829 Elevated white blood cell count, unspecified: Secondary | ICD-10-CM

## 2014-12-23 LAB — URINALYSIS W MICROSCOPIC (NOT AT ARMC)
BILIRUBIN URINE: NEGATIVE
Bilirubin Urine: NEGATIVE
Glucose, UA: 250 mg/dL — AB
Glucose, UA: 250 mg/dL — AB
HGB URINE DIPSTICK: NEGATIVE
HGB URINE DIPSTICK: NEGATIVE
KETONES UR: NEGATIVE mg/dL
Ketones, ur: NEGATIVE mg/dL
NITRITE: NEGATIVE
Nitrite: NEGATIVE
PROTEIN: NEGATIVE mg/dL
Protein, ur: NEGATIVE mg/dL
SPECIFIC GRAVITY, URINE: 1.013 (ref 1.005–1.030)
SPECIFIC GRAVITY, URINE: 1.015 (ref 1.005–1.030)
Urobilinogen, UA: 0.2 mg/dL (ref 0.0–1.0)
Urobilinogen, UA: 0.2 mg/dL (ref 0.0–1.0)
pH: 5.5 (ref 5.0–8.0)
pH: 5.5 (ref 5.0–8.0)

## 2014-12-23 LAB — CBC
HCT: 23.9 % — ABNORMAL LOW (ref 39.0–52.0)
HEMOGLOBIN: 7.3 g/dL — AB (ref 13.0–17.0)
MCH: 26.5 pg (ref 26.0–34.0)
MCHC: 30.5 g/dL (ref 30.0–36.0)
MCV: 86.9 fL (ref 78.0–100.0)
PLATELETS: 613 10*3/uL — AB (ref 150–400)
RBC: 2.75 MIL/uL — AB (ref 4.22–5.81)
RDW: 17.5 % — ABNORMAL HIGH (ref 11.5–15.5)
WBC: 8.8 10*3/uL (ref 4.0–10.5)

## 2014-12-23 LAB — BASIC METABOLIC PANEL
ANION GAP: 5 (ref 5–15)
BUN: 18 mg/dL (ref 6–23)
CALCIUM: 9.3 mg/dL (ref 8.4–10.5)
CO2: 26 mmol/L (ref 19–32)
Chloride: 105 mmol/L (ref 96–112)
Creatinine, Ser: 0.68 mg/dL (ref 0.50–1.35)
GLUCOSE: 268 mg/dL — AB (ref 70–99)
POTASSIUM: 4 mmol/L (ref 3.5–5.1)
SODIUM: 136 mmol/L (ref 135–145)

## 2014-12-23 LAB — GLUCOSE, CAPILLARY
GLUCOSE-CAPILLARY: 244 mg/dL — AB (ref 70–99)
GLUCOSE-CAPILLARY: 304 mg/dL — AB (ref 70–99)
Glucose-Capillary: 240 mg/dL — ABNORMAL HIGH (ref 70–99)
Glucose-Capillary: 215 mg/dL — ABNORMAL HIGH (ref 70–99)

## 2014-12-23 LAB — PREPARE RBC (CROSSMATCH)

## 2014-12-23 LAB — VANCOMYCIN, TROUGH: Vancomycin Tr: 15.1 ug/mL (ref 10.0–20.0)

## 2014-12-23 MED ORDER — DIPHENHYDRAMINE HCL 50 MG/ML IJ SOLN
25.0000 mg | Freq: Once | INTRAMUSCULAR | Status: AC
  Administered 2014-12-23: 25 mg via INTRAVENOUS
  Filled 2014-12-23: qty 1

## 2014-12-23 MED ORDER — METHYLPREDNISOLONE SODIUM SUCC 125 MG IJ SOLR
80.0000 mg | Freq: Once | INTRAMUSCULAR | Status: AC
  Administered 2014-12-23: 80 mg via INTRAVENOUS
  Filled 2014-12-23: qty 2

## 2014-12-23 MED ORDER — INSULIN GLARGINE 100 UNIT/ML ~~LOC~~ SOLN
28.0000 [IU] | Freq: Every day | SUBCUTANEOUS | Status: DC
  Administered 2014-12-23: 28 [IU] via SUBCUTANEOUS
  Filled 2014-12-23 (×2): qty 0.28

## 2014-12-23 MED ORDER — SODIUM CHLORIDE 0.9 % IV SOLN
Freq: Once | INTRAVENOUS | Status: AC
  Administered 2014-12-23: 13:00:00 via INTRAVENOUS

## 2014-12-23 MED ORDER — OXYCODONE-ACETAMINOPHEN 5-325 MG PO TABS: 1.0000 | ORAL_TABLET | ORAL | Status: DC | PRN

## 2014-12-23 MED ORDER — SODIUM CHLORIDE 0.9 % IV SOLN
Freq: Once | INTRAVENOUS | Status: AC
  Administered 2014-12-24: 03:00:00 via INTRAVENOUS

## 2014-12-23 NOTE — Progress Notes (Signed)
Blood started at 1535 at 125 ml/hr. Jason [name] had a temp of 99.41F before blood was given, after 31mls transfused temp went up to 101F, began to hear rhonchi and expiratory wheezes in the lungs bilaterally. Blood stopped and NS started at 20. Doctor called and gave orders to give tylenol, benadryl, and solumedrol.  Quincy Sheehan, RN

## 2014-12-23 NOTE — Progress Notes (Signed)
Patient ID: Jason [name], male   DOB: [birth year], 29 y.o.   MRN: [id number]         [facility] for Infectious Disease    Date of Admission:  12/01/2014   Total days of antibiotics 46        Day 20 metronidazole        Day 4 aztreonam        Day 4 vancomycin  Principal Problem:   Acute encephalopathy Active Problems:   Type 1 diabetes, uncontrolled, with neuropathy   Asthma   GERD   Gastroparesis due to DM   Protein-calorie malnutrition, severe   Spinal cord infarction (history of)   Tetraplegia   Neurogenic bladder   UTI (urinary tract infection)   Sacral pressure sore   Stage IV decubitus ulcer   Osteomyelitis, pelvis   Anemia   Burkholderia cepacia infection   Disorientation   PICC (peripherally inserted central catheter) in place   Temperature elevated   Acute respiratory failure   Leukocytosis   . sodium chloride   Intravenous Once  . baclofen  10 mg Oral BID WC  . baclofen  20 mg Oral QHS  . enoxaparin (LOVENOX) injection  40 mg Subcutaneous Q24H  . feeding supplement (PRO-STAT SUGAR FREE 64)  60 mL Oral BID  . fentaNYL  25 mcg Transdermal Q72H  . insulin aspart  0-9 Units Subcutaneous TID WC  . insulin glargine  28 Units Subcutaneous QHS  . lidocaine  1 application Topical Once per day on Mon Wed Fri  . liver oil-zinc oxide   Topical BID  . metronidazole  500 mg Intravenous Q8H  . mirtazapine  15 mg Oral QHS  . mupirocin ointment  1 application Nasal Once per day on Mon Wed Fri  . pregabalin  100 mg Oral TID AC  . pregabalin  200 mg Oral QHS  . sodium chloride 0.9 % 50 mL with aztreonam (AZACTAM) 1 g infusion   Intravenous 3 times per day  . sodium chloride  3 mL Intravenous Q12H  . vancomycin  1,250 mg Intravenous Q24H    Subjective: He states that he is feeling much better today.  Review of Systems: Pertinent items are noted in HPI.  Past Medical History  Diagnosis Date  . GERD (gastroesophageal reflux disease)   . Asthma   . Hx MRSA  infection     on face  . Gastroparesis   . Diabetic neuropathy   . Seizures   . Family history of anesthesia complication     Pt mother can't have epidural procedures  . Dysrhythmia   . Pneumonia   . Arthritis   . Fibromyalgia   . 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 Type I diabetes mellitus     sees Dr. Loanne Drilling     History  Substance Use Topics  . Smoking status: Former Smoker -- 12 years    Types: Cigars, Cigarettes    Quit date: 09/07/2014  . Smokeless tobacco: Never Used     Comment: e-cigarette and cigars currently  . Alcohol Use: No     Comment: rarely    Family History  Problem Relation Age of Onset  . Diabetes Father   . Hypertension Father   . Asthma      fhx  . Hypertension      fhx  . Stroke      fhx  . Heart disease Mother    Allergies  Allergen Reactions  .  Cefuroxime Axetil Anaphylaxis  . Ertapenem Other (See Comments)    Rash and confusion  . Morphine And Related Other (See Comments)    Changed mental status, confusion, headache, visual hallucination  . Penicillins Anaphylaxis and Other (See Comments)    ?can take amoxicillin?  . Sulfa Antibiotics Anaphylaxis, Shortness Of Breath and Other (See Comments)  . Tessalon [Benzonatate] Anaphylaxis  . Shellfish Allergy Itching and Other (See Comments)    Took benadryl to alleviate reaction    OBJECTIVE: Blood pressure 111/66, pulse 89, temperature 99.6 F (37.6 C), temperature source Oral, resp. rate 22, height 5\' 8"  (1.727 m), weight 130 lb 1.1 oz (59 kg), SpO2 95 %. General: Alert and comfortable. He does not appear confused. His mother is present and confirms he is back to his usual baseline. Lungs: Clear Cor: Regular S1 and S2 with no murmur Abdomen: Soft and nontender. Colostomy and suprapubic catheter in place  Lab Results Lab Results  Component Value Date   WBC 8.8 12/23/2014   HGB 7.3* 12/23/2014   HCT 23.9* 12/23/2014   MCV 86.9 12/23/2014   PLT 613*  12/23/2014    Lab Results  Component Value Date   CREATININE 0.68 12/23/2014   BUN 18 12/23/2014   NA 136 12/23/2014   K 4.0 12/23/2014   CL 105 12/23/2014   CO2 26 12/23/2014    Lab Results  Component Value Date   ALT 13 12/12/2014   AST 16 12/12/2014   ALKPHOS 83 12/12/2014   BILITOT 0.7 12/12/2014     Microbiology: No results found for this or any previous visit (from the past 240 hour(s)).  Assessment: His confusion resolved shortly after stopping meropenem. He needs another 10 days of his current antibiotic regimen to complete therapy for pelvic osteomyelitis. He can take metronidazole by mouth and continue IV aztreonam and vancomycin.  Plan: 1. Change metronidazole to oral form 2. Continue IV vancomycin and aztreonam 3. Continue antibiotics 10 more days through 01/02/2015 4. I will sign off now. He can followup in our clinic as needed.  Michel Bickers, MD [facility] for [facility] [phone] pager   [phone] cell 12/23/2014, 12:19 PM

## 2014-12-23 NOTE — Progress Notes (Signed)
PROGRESS NOTE    Jason [name] OFB:[id number] DOB: 1985-11-04 DOA: 12/01/2014 PCP: [name], MD  HPI/Brief narrative 29 year old male with a history of spinal cord infarct and paraplegia who has had multiple recent infections was hospitalized on 12/01/2014 for hallucinations believed to be related to meropenem. His mental status improved but he developed worsening sepsis and recurrent altered mental status. He was intubated for respiratory decompensation and transferred to ICU on 12/14/14. After stabilization, transferred back to SDU & [place] service on 3/14.  Subjective: Seen with mother at bedside, denies any complaints. Had prolonged discussion about his diabetes regimen as well as antibiotics.  Assessment/Plan:  Acute respiratory failure with hypoxia :  Due to atelectasis, possible HCAP & impaired cough mechanics due to quadriplegic status.  Extubated 3/12. Pulmonary hygiene. Stable.  Severe sepsis: Secondary to suspected aspiration pneumonia, chronic wound infection and osteomyelitis of the ischium: Antibiotics per ID.  Sepsis physiology resolved. ID recommended aztreonam, metronidazole and vancomycin for total of 45 days. She has PICC line in.  Suspected aspiration pneumonia:  Continue Flagyl. Meropenem discontinued due to concerns for hallucinations. 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  Status post debridement 3/2 done by Dr. Migdalia Dk and placement of Acell.  Chronic hypotension: Continue midodrine  Chronic suprapubic catheter :   Hypokalemia: Replaced  Severe Protein calorie malnutrition: Mx per dietitian recommendation.  Chronic anemia:  Without evidence of bleeding. S/p 1 unit of PRBC on 3/8. Stable. Transfuse another unit of blood.  Uncontrolled DM 1:  Adjust insulins and monitor. Fluctuating.  Discontinued close Cerner  nutritional supplements, increase Lantus insulin to 28 units per night.  Acute encephalopathy: Resolved. Minimize sedatives and opioids.  Neurogenic bladder: Suprapubic catheter.  Spinal cord infarction and quadriplegia:  S/p Diverting Colostomy.  Reactive thrombocytosis   Code Status: Full Family Communication: Discussed with mother on 3/16 Disposition Plan: Likely to be discharged in a.m.   SIGNIFICANT EVENTS: 3/02 debridement of decubitus ulcers and wound VAC 3/07 SDU orders 3/08 Respiratory decompensation, intubated  3/08 one unit PRBCs for Hgb 6.4 3/09 Stable on vent, no distress  3/12 Extubated  3/13 transfer to SDU ordered   Consultants:  CCM- signed off   Infectious disease  Urology  Plastic surgery  Procedures:  S/p mechanical ventilation  Antibiotics:  Meropenem-DC'd   Metronidazole  Vancomycin  Aztreonam   Objective: Filed Vitals:   12/22/14 1957 12/22/14 2346 12/23/14 0358 12/23/14 0726  BP: 104/62 116/69 101/64 103/68  Pulse: 87 86 87 81  Temp: 98.6 F (37 C) 97.8 F (36.6 C) 98.2 F (36.8 C) 98.4 F (36.9 C)  TempSrc: Oral Oral Oral Oral  Resp: 16 19 13 16   Height:      Weight:      SpO2: 99% 100% 100% 99%    Intake/Output Summary (Last 24 hours) at 12/23/14 1139 Last data filed at 12/23/14 1000  Gross per 24 hour  Intake   2855 ml  Output   2600 ml  Net    255 ml   Filed Weights   12/17/14 0258 12/18/14 0406 12/19/14 0500  Weight: 59.2 kg (130 lb 8.2 oz) 57 kg (125 lb 10.6 oz) 59 kg (130 lb 1.1 oz)     Exam:  General exam: pleasant young male sitting up comfortably in bed.  Respiratory system: slightly diminished breath sounds in the bases but otherwise clear to auscultation. No increased work  of breathing. Cardiovascular system: S1 & S2 heard, RRR. No JVD, murmurs, gallops, clicks or pedal edema. telemetry: Sinus rhythm.  Gastrointestinal system: Abdomen is nondistended, soft and nontender. Normal bowel  sounds heard. Diverting colostomy with small liquid brown stools in bag. Suprapubic catheter site intact. Central nervous system: Alert and oriented. No focal neurological deficits. Extremities: chronic quadriparesis. Skin see previous exam from Dr. Janelle Floor noted.  Data Reviewed: Basic Metabolic Panel:  Recent Labs Lab 12/17/14 0228 12/18/14 0313 12/19/14 0335 12/20/14 0415 12/21/14 0710 12/21/14 2229 12/22/14 0810 12/23/14 0532  NA 141 144 140 138 140  --  135 136  K 4.7 3.6 3.7 4.2 3.8  --  4.0 4.0  CL 115* 115* 110 107 106  --  104 105  CO2 19 24 25 27 30   --  27 26  GLUCOSE 282* 222* 167* 225* 131* 559* 228* 268*  BUN 26* 23 16 12 11   --  15 18  CREATININE 0.95 0.84 0.77 0.76 0.57  --  0.69 0.68  CALCIUM 9.3 9.8 9.5 9.5 9.4  --  9.6 9.3  MG 1.4* 1.4* 1.9  --   --   --   --   --   PHOS 2.8 3.0  --   --   --   --   --   --    Liver Function Tests: No results for input(s): AST, ALT, ALKPHOS, BILITOT, PROT, ALBUMIN in the last 168 hours. No results for input(s): LIPASE, AMYLASE in the last 168 hours. No results for input(s): AMMONIA in the last 168 hours. CBC:  Recent Labs Lab 12/17/14 0228 12/18/14 0313 12/21/14 0710 12/22/14 0810 12/23/14 0532  WBC 24.4* 19.1* 10.3 10.5 8.8  HGB 6.9* 7.4* 7.4* 7.5* 7.3*  HCT 21.6* 23.2* 23.7* 24.1* 23.9*  MCV 85.0 83.8 85.9 85.8 86.9  PLT 529* 586* 605* 627* 613*   Cardiac Enzymes: No results for input(s): CKTOTAL, CKMB, CKMBINDEX, TROPONINI in the last 168 hours. BNP (last 3 results)  Recent Labs  01/29/14 2113  PROBNP 1343.0*   CBG:  Recent Labs Lab 12/22/14 0813 12/22/14 1240 12/22/14 1720 12/22/14 2115 12/23/14 0728  GLUCAP 212* 121* 120* 151* 244*    No results found for this or any previous visit (from the past 240 hour(s)).        Studies: No results found.      Scheduled Meds: . baclofen  10 mg Oral BID WC  . baclofen  20 mg Oral QHS  . enoxaparin (LOVENOX) injection  40 mg  Subcutaneous Q24H  . feeding supplement (PRO-STAT SUGAR FREE 64)  60 mL Oral BID  . fentaNYL  25 mcg Transdermal Q72H  . insulin aspart  0-9 Units Subcutaneous TID WC  . insulin glargine  25 Units Subcutaneous QHS  . lidocaine  1 application Topical Once per day on Mon Wed Fri  . liver oil-zinc oxide   Topical BID  . metronidazole  500 mg Intravenous Q8H  . mirtazapine  15 mg Oral QHS  . mupirocin ointment  1 application Nasal Once per day on Mon Wed Fri  . pregabalin  100 mg Oral TID AC  . pregabalin  200 mg Oral QHS  . sodium chloride 0.9 % 50 mL with aztreonam (AZACTAM) 1 g infusion   Intravenous 3 times per day  . sodium chloride  3 mL Intravenous Q12H  . vancomycin  1,250 mg Intravenous Q24H   Continuous Infusions: . sodium chloride 10 mL/hr at 12/22/14 0615  .  lactated ringers 10 mL/hr at 12/19/14 1100    Principal Problem:   Acute encephalopathy Active Problems:   Type 1 diabetes, uncontrolled, with neuropathy   Asthma   GERD   Gastroparesis due to DM   Protein-calorie malnutrition, severe   Spinal cord infarction (history of)   Tetraplegia   Neurogenic bladder   UTI (urinary tract infection)   Sacral pressure sore   Stage IV decubitus ulcer   Osteomyelitis, pelvis   Anemia   Burkholderia cepacia infection   Disorientation   PICC (peripherally inserted central catheter) in place   Temperature elevated   Acute respiratory failure   Leukocytosis    Time spent: 30 minutes.    Birdie Hopes, MD [facility] Pager [phone]  If 7PM-7AM, please contact night-coverage [email or web address] Password [email or web address] 12/23/2014, 11:39 AM    LOS: 22 days

## 2014-12-23 NOTE — Progress Notes (Addendum)
ANTIBIOTIC CONSULT NOTE - FOLLOW UP  Pharmacy Consult for Aztreonam and Vancomycin Indication: osteomyelitis  Allergies  Allergen Reactions  . Cefuroxime Axetil Anaphylaxis  . Ertapenem Other (See Comments)    Rash and confusion  . Morphine And Related Other (See Comments)    Changed mental status, confusion, headache, visual hallucination  . Penicillins Anaphylaxis and Other (See Comments)    ?can take amoxicillin?  . Sulfa Antibiotics Anaphylaxis, Shortness Of Breath and Other (See Comments)  . Tessalon [Benzonatate] Anaphylaxis  . Shellfish Allergy Itching and Other (See Comments)    Took benadryl to alleviate reaction    Patient Measurements: Height: 5\' 8"  (172.7 cm) (from 11/08/14) Weight: 130 lb 1.1 oz (59 kg) IBW/kg (Calculated) : 68.4  Vital Signs: Temp: 99.6 F (37.6 C) (03/17 1202) Temp Source: Oral (03/17 1202) BP: 111/66 mmHg (03/17 1202) Pulse Rate: 89 (03/17 1202) Intake/Output from previous day: 03/16 0701 - 03/17 0700 In: 2845 [P.O.:1020; I.V.:1325; IV Piggyback:500] Out: 3250 [Urine:2550; Stool:700]  Labs:  Recent Labs  12/21/14 0710 12/22/14 0810 12/23/14 0532  WBC 10.3 10.5 8.8  HGB 7.4* 7.5* 7.3*  PLT 605* 627* 613*  CREATININE 0.57 0.69 0.68   Estimated Creatinine Clearance: 112.7 mL/min (by C-G formula based on Cr of 0.68).  Assessment:  29 y.o. male continues on Vancomcyin, Aztreonam, and Flagyl for ischial osteomyelitis as well as asp pna. Tm 100.7. ID signing off today but recommends continuing current antibiotics until 3/27. Pt is quadriplegic. SCr 0.68 (peak this admit at 1.04), CrCl > 90 (likely not reflective of true renal function in quadriplegic). UOP good.  Goal of Therapy:  Vancomycin trough level 15-20 mcg/ml appropriate Azactam dose for renal function and infection  Plan:   Aztreonam 1 gram IV q8hrs.  Vancomycin 1250 mg IV q24hrs.  Follow renal function, progress.  Vancomycin trough level tonight at steady state.   Sherlon Handing, PharmD, BCPS Clinical pharmacist, pager [phone] 12/23/2014,1:31 PM   =============================  Addendum: - VT 15.1 mcg/mL, therapeutic   Plan: - Continue vanc 1250mg  IV Q24H - Monitor renal fxn, UOP, clinical progress    Kasem Mozer D. Mina Marble, PharmD, BCPS Pager:  [phone] 12/23/2014, 10:20 PM

## 2014-12-24 ENCOUNTER — Ambulatory Visit: Payer: Medicaid Other | Admitting: Physical Medicine & Rehabilitation

## 2014-12-24 DIAGNOSIS — E1143 Type 2 diabetes mellitus with diabetic autonomic (poly)neuropathy: Secondary | ICD-10-CM

## 2014-12-24 LAB — CBC
HEMATOCRIT: 29.2 % — AB (ref 39.0–52.0)
HEMOGLOBIN: 9.3 g/dL — AB (ref 13.0–17.0)
MCH: 27.5 pg (ref 26.0–34.0)
MCHC: 31.8 g/dL (ref 30.0–36.0)
MCV: 86.4 fL (ref 78.0–100.0)
Platelets: 642 10*3/uL — ABNORMAL HIGH (ref 150–400)
RBC: 3.38 MIL/uL — ABNORMAL LOW (ref 4.22–5.81)
RDW: 16.5 % — AB (ref 11.5–15.5)
WBC: 11.6 10*3/uL — ABNORMAL HIGH (ref 4.0–10.5)

## 2014-12-24 LAB — BASIC METABOLIC PANEL
ANION GAP: 5 (ref 5–15)
BUN: 27 mg/dL — ABNORMAL HIGH (ref 6–23)
CALCIUM: 9.2 mg/dL (ref 8.4–10.5)
CO2: 25 mmol/L (ref 19–32)
CREATININE: 0.7 mg/dL (ref 0.50–1.35)
Chloride: 106 mmol/L (ref 96–112)
GFR calc Af Amer: >90 mL/min (ref >90)
GFR calc non Af Amer: >90 mL/min (ref >90)
Glucose, Bld: 346 mg/dL — ABNORMAL HIGH (ref 70–99)
Potassium: 4.4 mmol/L (ref 3.5–5.1)
Sodium: 136 mmol/L (ref 135–145)

## 2014-12-24 LAB — GLUCOSE, CAPILLARY
GLUCOSE-CAPILLARY: 256 mg/dL — AB (ref 70–99)
Glucose-Capillary: 356 mg/dL — ABNORMAL HIGH (ref 70–99)
Glucose-Capillary: 206 mg/dL — ABNORMAL HIGH (ref 70–99)

## 2014-12-24 LAB — TRANSFUSION REACTION
DAT C3: NEGATIVE
Post RXN DAT IgG: NEGATIVE

## 2014-12-24 MED ORDER — INSULIN GLARGINE 100 UNIT/ML ~~LOC~~ SOLN: 28.0000 [IU] | Freq: Every day | SUBCUTANEOUS | Status: DC

## 2014-12-24 MED ORDER — HEPARIN SOD (PORK) LOCK FLUSH 100 UNIT/ML IV SOLN
250.0000 [IU] | INTRAVENOUS | Status: AC | PRN
  Administered 2014-12-24: 250 [IU]

## 2014-12-24 MED ORDER — METRONIDAZOLE 500 MG PO TABS: 500.0000 mg | ORAL_TABLET | Freq: Three times a day (TID) | ORAL | Status: DC

## 2014-12-24 MED ORDER — AZTREONAM 1 G IJ SOLR: 1.0000 g | Freq: Three times a day (TID) | INTRAMUSCULAR | Status: DC

## 2014-12-24 MED ORDER — VANCOMYCIN HCL IN DEXTROSE 1-5 GM/200ML-% IV SOLN: 1250.0000 mg | INTRAVENOUS | Status: DC

## 2014-12-24 MED ORDER — VANCOMYCIN HCL IN DEXTROSE 1-5 GM/200ML-% IV SOLN: 1500.0000 mg | INTRAVENOUS | Status: DC

## 2014-12-24 NOTE — Clinical Social Work Note (Signed)
Patient will discharge to home Anticipated discharge date:12/24/14 Family notified: at bedside Transportation by [facility]- called at [place] signing off.  Domenica Reamer, [place] Social Worker [phone]

## 2014-12-24 NOTE — Progress Notes (Addendum)
Spoke to Dr. Willeen Niece from the pathology department of the blood bank concerning pts. febrile reaction to blood earlier in the day. Dr. Avis Epley said it was ok to transfuse blood. One unit RBC ordered. Will closely monitor pt while transfusion is being completed.

## 2014-12-24 NOTE — Progress Notes (Signed)
16 Days Post-Op  Subjective: The patient was seen two days ago in his room with his mother and father at his bedside.  He was alert and answered questions appropriately.    Objective: Vital signs in last 24 hours: Temp:  [97.8 F (36.6 C)-101 F (38.3 C)] 98.8 F (37.1 C) (03/18 0443) Pulse Rate:  [85-98] 85 (03/18 0443) Resp:  [18-31] 31 (03/18 0443) BP: (105-127)/(62-81) 114/71 mmHg (03/18 0443) SpO2:  [94 %-100 %] 100 % (03/18 0443) Last BM Date: 12/23/14  Intake/Output from previous day: 03/17 0701 - 03/18 0700 In: 3816.7 [P.O.:360; I.V.:2641.7; Blood:365; IV Piggyback:450] Out: 1657 [Urine:2450; XUXYB:338] Intake/Output this shift:    General appearance: alert, cooperative and no distress Incision/Wound: no significant deterioration but no significant improvement either.  Bone exposed at the sacral area.  Lab Results:   Recent Labs  12/23/14 0532 12/24/14 0640  WBC 8.8 11.6*  HGB 7.3* 9.3*  HCT 23.9* 29.2*  PLT 613* 642*   BMET  Recent Labs  12/23/14 0532 12/24/14 0640  NA 136 PENDING  K 4.0 PENDING  CL 105 PENDING  CO2 26 PENDING  GLUCOSE 268* 346*  BUN 18 27*  CREATININE 0.68 0.70  CALCIUM 9.3 9.2   PT/INR No results for input(s): LABPROT, INR in the last 72 hours. ABG No results for input(s): PHART, HCO3 in the last 72 hours.  Invalid input(s): PCO2, PO2  Studies/Results: No results found.  Anti-infectives: Anti-infectives    Start     Dose/Rate Route Frequency Ordered Stop   12/22/14 1400  sodium chloride 0.9 % 50 mL with aztreonam (AZACTAM) 1 g infusion     100 mL/hr  Intravenous 3 times per day 12/22/14 1238     12/20/14 2200  vancomycin (VANCOCIN) 1,250 mg in sodium chloride 0.9 % 250 mL IVPB     1,250 mg 166.7 mL/hr over 90 Minutes Intravenous Every 24 hours 12/20/14 2032     12/20/14 1800  aztreonam (AZACTAM) 1 g in dextrose 5 % 50 mL IVPB  Status:  Discontinued     1 g 100 mL/hr over 30 Minutes Intravenous Every 8 hours 12/20/14  1732 12/22/14 1238   12/17/14 1700  meropenem (MERREM) 1 g in sodium chloride 0.9 % 100 mL IVPB  Status:  Discontinued     1 g 200 mL/hr over 30 Minutes Intravenous Every 8 hours 12/17/14 1550 12/20/14 1713   12/13/14 1500  metroNIDAZOLE (FLAGYL) IVPB 500 mg     500 mg 100 mL/hr over 60 Minutes Intravenous Every 8 hours 12/13/14 1446     12/12/14 1400  aztreonam (AZACTAM) 1 g in dextrose 5 % 50 mL IVPB  Status:  Discontinued     1 g 100 mL/hr over 30 Minutes Intravenous 3 times per day 12/12/14 1258 12/17/14 1537   12/11/14 1830  fluconazole (DIFLUCAN) IVPB 200 mg     200 mg 100 mL/hr over 60 Minutes Intravenous  Once 12/11/14 1747 12/11/14 1949   12/10/14 0000  levofloxacin (LEVAQUIN) 500 MG/100ML SOLN  Status:  Discontinued     over 60 Minutes   12/10/14 1403 12/10/14    12/10/14 0000  metroNIDAZOLE (FLAGYL) 5-0.79 MG/ML-% IVPB  Status:  Discontinued     100 mL/hr over 60 Minutes   12/10/14 1403 12/10/14    12/10/14 0000  vancomycin 1,500 mg in sodium chloride 0.9 % 500 mL     1,500 mg 250 mL/hr over 120 Minutes Intravenous Every 24 hours 12/10/14 1403 12/31/14 2359   12/10/14  0000  levofloxacin (LEVAQUIN) 500 MG/100ML SOLN  Status:  Discontinued     over 60 Minutes   12/10/14 1545 12/10/14    12/10/14 0000  levofloxacin (LEVAQUIN) 500 MG/100ML SOLN     500 mg 100 mL/hr over 60 Minutes Intravenous Every 24 hours 12/10/14 1654     12/10/14 0000  metroNIDAZOLE (FLAGYL) 5-0.79 MG/ML-% IVPB     500 mg 100 mL/hr over 60 Minutes Intravenous Every 8 hours 12/10/14 1654     12/09/14 1600  metroNIDAZOLE (FLAGYL) IVPB 500 mg  Status:  Discontinued     500 mg 100 mL/hr over 60 Minutes Intravenous Every 8 hours 12/09/14 1415 12/12/14 1118   12/08/14 1627  polymyxin B 500,000 Units, bacitracin 50,000 Units in sodium chloride irrigation 0.9 % 500 mL irrigation  Status:  Discontinued       As needed 12/08/14 1628 12/08/14 1702   12/08/14 1430  vancomycin (VANCOCIN) 1,500 mg in sodium chloride 0.9  % 500 mL IVPB  Status:  Discontinued     1,500 mg 250 mL/hr over 120 Minutes Intravenous Every 24 hours 12/07/14 1405 12/07/14 1406   12/08/14 0600  ciprofloxacin (CIPRO) IVPB 400 mg  Status:  Discontinued     400 mg 200 mL/hr over 60 Minutes Intravenous On call to O.R. 12/07/14 1256 12/07/14 1259   12/07/14 1430  vancomycin (VANCOCIN) 1,500 mg in sodium chloride 0.9 % 500 mL IVPB  Status:  Discontinued     1,500 mg 250 mL/hr over 120 Minutes Intravenous Every 24 hours 12/07/14 1406 12/17/14 1542   12/04/14 1300  levofloxacin (LEVAQUIN) IVPB 500 mg  Status:  Discontinued     500 mg 100 mL/hr over 60 Minutes Intravenous Every 24 hours 12/04/14 1108 12/12/14 1118   12/04/14 1200  metroNIDAZOLE (FLAGYL) IVPB 500 mg  Status:  Discontinued     500 mg 100 mL/hr over 60 Minutes Intravenous Every 8 hours 12/04/14 1108 12/09/14 1415   12/03/14 0800  vancomycin (VANCOCIN) 1,250 mg in sodium chloride 0.9 % 250 mL IVPB  Status:  Discontinued     1,250 mg 166.7 mL/hr over 90 Minutes Intravenous Every 24 hours 12/02/14 1158 12/07/14 1405   12/02/14 1130  vancomycin (VANCOCIN) IVPB 1000 mg/200 mL premix  Status:  Discontinued     1,000 mg 200 mL/hr over 60 Minutes Intravenous Every 24 hours 12/02/14 0119 12/02/14 1155   12/02/14 0300  meropenem (MERREM) 1 g in sodium chloride 0.9 % 100 mL IVPB  Status:  Discontinued     1 g 200 mL/hr over 30 Minutes Intravenous 3 times per day 12/02/14 5643 12/04/14 1108      Assessment/Plan: s/p Procedure(s): IRRIGATION AND DEBRIDEMENT SACRAL WOUND AND RIGHT ISCHIAL WOUND  (Right) APPLICATION OF A-CELL AND WOUND VAC  (N/A) PT hydrotherapy, off loading, protein intake and vitamins.  The challenge with this wound is that there is a limit to the depth of debridement possible.  He is down to bone and futher debridement would expose his nerves. Debridement is only warranted if there is wide necrotic tissue for this situation.  LOS: 23 days     [facility] 12/24/2014

## 2014-12-24 NOTE — Discharge Summary (Addendum)
Physician Discharge Summary  Jason [name] [place] ZOX:[id number] DOB: 1986-03-23 DOA: 12/01/2014  PCP: [name], MD  Admit date: 12/01/2014 Discharge date: 12/24/2014  Time spent: 40 minutes  Recommendations for Outpatient Follow-up:  1. Follow-up with primary care physician within one week. 2. Follow-up with wound care center, follow-up with infectious disease in 1-2 weeks. 3. Continue home health. 4. Obtain CBC and BMP in 1 week, patient is on vancomycin. 5. Discharge home on oral Flagyl, IV aztreonam and vancomycin, end date is 01/02/2015  Discharge Diagnoses:  Principal Problem:   Acute encephalopathy Active Problems:   Type 1 diabetes, uncontrolled, with neuropathy   Asthma   GERD   Gastroparesis due to DM   Protein-calorie malnutrition, severe   Spinal cord infarction (history of)   Tetraplegia   Neurogenic bladder   UTI (urinary tract infection)   Sacral pressure sore   Stage IV decubitus ulcer   Osteomyelitis, pelvis   Anemia   Burkholderia cepacia infection   Disorientation   PICC (peripherally inserted central catheter) in place   Temperature elevated   Acute respiratory failure   Leukocytosis   Discharge Condition: Stable  Diet recommendation: Carbohydrate modified diet  Filed Weights   12/17/14 0258 12/18/14 0406 12/19/14 0500  Weight: 59.2 kg (130 lb 8.2 oz) 57 kg (125 lb 10.6 oz) 59 kg (130 lb 1.1 oz)    History of present illness:  Jason [name] is a 29 y.o. 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  Patient was recently hospitalized from 2/1 to 2/12. He was treated for ischial osteomyelitis, pneumonia, Klebsiella bacteremia, and Burkhoderia UTI in hospital. Patient was discharged on IV vancomycin plus ertapenem, which are supposed to be  continued for total of 6 weeks per ID. Patient has been doing okay until 3 days ago when he started hallucinating. It seems that he is on the phone talking to somebody who is not there. Mother reports pt has not been sleeping at all in the past 3 days. Mother states that patient had hallucination 3 months ago, which was likely caused by morphine, since his symptoms resolved after discontinuation of morphine. This time, mother discontinued patient's Percocet, Lyrica and baclofen without any help. His mother states that patient developed new rashes over his forehead and upper face. He has back pain. No headache, neck rigidity or photophobia. Patient denies fever, chills, cough, chest pain, SOB, nausea, vomiting, abdominal pain, diarrhea, dysuria, urgency, frequency, hematuria or leg swelling.   In ED, patient was found to have negative CT head for acute abnormalities. Leukocytosis with WBC 13.0, negative UDS, lactate 1.06, positive urinalysis for moderate leukocytes, negative salicylate and acetaminophen level, ammonia 17, temperature normal. Electrolytes okay. Patient's admitted to inpatient for further evaluation and treatment.  Hospital Course:   Acute respiratory failure with hypoxia :  Ventilator dependent respiratory failure likely secondary to pneumonia and sepsis Due to atelectasis, possible HCAP & impaired cough mechanics due to quadriplegic status.  Extubated 3/12. Pulmonary hygiene. Stable.  Severe sepsis: Secondary to suspected aspiration pneumonia, chronic wound infection and osteomyelitis of the ischium.  Antibiotics per ID. Sepsis physiology resolved. ID recommended aztreonam, metronidazole and vancomycin for total of 45 days. Has PICC line, and date is 01/02/2015.  Suspected aspiration pneumonia:  Continue Flagyl. Meropenem discontinued due to concerns for hallucinations. Started on vancomycin and aztreonam 3/14 to  cover for PNA and wound infections  Multiple Sacral decubitus  ulcers and ischial osteomyelitis:  Continue above antibiotics.  Status post debridement 3/2 done by Dr. Migdalia Dk and placement of Acell. Per last note from Dr. Migdalia Dk, debridement is down to the bone, follow-up as outpatient with them, not sure if candidate for more debridement. Continue air mattress at home and frequent turning.  Chronic hypotension: Continue midodrine  Chronic suprapubic catheter :   Hypokalemia: Replaced  Severe Protein calorie malnutrition: Mx per dietitian recommendation.  Chronic anemia: Without evidence of bleeding. S/p 1 unit of PRBC on 3/8. Stable. Transfuse another unit of blood.  Uncontrolled DM 1:  Adjust insulins and monitor. Fluctuating.  Had a long discussion with mother at bedside, she basically feeds him regular food and compensate with higher prandial insulin. Increased Lantus insulin to 28 units per night.  Acute encephalopathy: Resolved. Minimize sedatives and opioids.  Neurogenic bladder:  Had Klebsiella and BURKHOLDERIA UTI on previous admission in February.  Spinal cord infarction and quadriplegia: Secondary to spinal cord infarction, patient awake alert and oriented 3. 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  S/p Diverting Colostomy.  Reactive thrombocytosis   Procedures:  None  Consultations:  Psychiatry.  Neurology.  Plastic surgery.  Infectious disease  Discharge Exam: Filed Vitals:   12/24/14 1140  BP: 98/63  Pulse: 75  Temp: 98 F (36.7 C)  Resp: 18   General: Alert and awake, oriented x3, not in any acute distress. HEENT: anicteric sclera, pupils reactive to light and accommodation, EOMI CVS: S1-S2 clear, no murmur rubs or gallops Chest: clear to auscultation bilaterally, no wheezing, rales or rhonchi Abdomen: soft nontender, nondistended, normal bowel sounds, no organomegaly Extremities: no cyanosis, clubbing or edema noted bilaterally Neuro: Cranial nerves II-XII intact, no focal neurological  deficits.  These pictures taken by Dr. Algis Liming on 12/20/2013           Discharge Instructions   Discharge Instructions    Diet - low sodium heart healthy    Complete by:  As directed      Diet Carb Modified    Complete by:  As directed      Face-to-face encounter (required for Medicare/Medicaid patients)    Complete by:  As directed   I Xu,Fang certify that this patient is under my care and that I, or a nurse practitioner or physician's assistant working with me, had a face-to-face encounter that meets the physician face-to-face encounter requirements with this patient on 12/10/2014. The encounter with the patient was in whole, or in part for the following medical condition(s) which is the primary reason for home health care (List medical condition): quadraplegia/osteomyelitis/decubitus ulcer  The encounter with the patient was in whole, or in part, for the following medical condition, which is the primary reason for home health care:  quadraplegia/osteomyelitis/decubitus ulcer  I certify that, based on my findings, the following services are medically necessary home health services:  Nursing  Reason for Medically Necessary Home Health Services:  Skilled Nursing- Change/Decline in Patient Status  My clinical findings support the need for the above services:  Bedbound  Further, I certify that my clinical findings support that this patient is homebound due to:  Open/draining pressure/stasis ulcer     Home Health    Complete by:  As directed   To provide the following care/treatments:   OT [place] work       Increase activity slowly    Complete by:  As directed  Increase activity slowly    Complete by:  As directed           Current Discharge Medication List    START taking these medications   Details  aztreonam 1 g in sodium chloride 0.9 % 50 mL Inject 1 g into the vein every 8 (eight) hours. Qty: 1 ampule, Refills: 0    !! feeding supplement,  GLUCERNA SHAKE, (GLUCERNA SHAKE) LIQD Take 237 mLs by mouth 3 (three) times daily between meals. Qty: 30 Can, Refills: 0    liver oil-zinc oxide (DESITIN) 40 % ointment Apply topically 2 (two) times daily. Qty: 56.7 g, Refills: 0    metroNIDAZOLE (FLAGYL) 500 MG tablet Take 1 tablet (500 mg total) by mouth 3 (three) times daily. Qty: 30 tablet, Refills: 0    QUEtiapine (SEROQUEL) 25 MG tablet Take 1 tablet (25 mg total) by mouth 3 (three) times daily. Qty: 90 tablet, Refills: 0     !! - Potential duplicate medications found. Please discuss with provider.    CONTINUE these medications which have CHANGED   Details  insulin glargine (LANTUS) 100 UNIT/ML injection Inject 0.28 mLs (28 Units total) into the skin at bedtime. Qty: 10 mL, Refills: 0    !! pregabalin (LYRICA) 200 MG capsule Take 1 capsule (200 mg total) by mouth at bedtime. Qty: 30 capsule, Refills: 0    vancomycin (VANCOCIN) 1 GM/200ML SOLN Inject 250 mLs (1,250 mg total) into the vein daily. Qty: 7000 mL, Refills: 0     !! - Potential duplicate medications found. Please discuss with provider.    CONTINUE these medications which have NOT CHANGED   Details  acetaminophen (TYLENOL) 500 MG tablet Take 1,000 mg by mouth every 6 (six) hours as needed for headache.    albuterol (PROVENTIL) (2.5 MG/3ML) 0.083% nebulizer solution Take 3 mLs (2.5 mg total) by nebulization every 4 (four) hours as needed for wheezing or shortness of breath. Qty: 75 mL, Refills: 3    diclofenac sodium (VOLTAREN) 1 % GEL Apply 2 g topically daily as needed (for pain). Qty: 100 g, Refills: 0    diphenoxylate-atropine (LOMOTIL) 2.5-0.025 MG per tablet Take 1 tablet by mouth 4 (four) times daily as needed for diarrhea or loose stools.    fentaNYL (DURAGESIC - DOSED MCG/HR) 25 MCG/HR patch Place 1 patch (25 mcg total) onto the skin every 3 (three) days. Qty: 10 patch, Refills: 0   Associated Diagnoses: Spinal cord infarction; Type 1 diabetes,  uncontrolled, with neuropathy; Sacral pressure sore, unspecified pressure ulcer stage; Tetraplegia    insulin aspart (NOVOLOG) 100 UNIT/ML injection Inject 4 Units into the skin 3 (three) times daily before meals. Qty: 10 mL, Refills: 0    lidocaine (XYLOCAINE) 2 % jelly Apply 1 application topically 3 (three) times a week.    lidocaine (XYLOCAINE) 5 % ointment Apply 1 application topically as needed (mouth sores).    midodrine (PROAMATINE) 5 MG tablet Take 5 mg by mouth as needed (Systolic blood pressure <14).    mupirocin ointment (BACTROBAN) 2 % Place 1 application into the nose 3 (three) times a week.    ondansetron (ZOFRAN) 4 MG tablet Take 4 mg by mouth every 8 (eight) hours as needed for nausea or vomiting.    Protein POWD Take 1 scoop by mouth 2 (two) times daily. Add whey powder to yogurt as a protein supplement.    sodium chloride (OCEAN) 0.65 % SOLN nasal spray Place 1 spray into both nostrils as needed for congestion.  atorvastatin (LIPITOR) 20 MG tablet Take 20 mg by mouth daily.    baclofen (LIORESAL) 10 MG tablet Take 1-2 tablets (10-20 mg total) by mouth 3 (three) times daily. Takes 10mg  in morning and lunchtime and takes 20mg  at bedtime Qty: 30 each, Refills: 0   Associated Diagnoses: Spinal cord infarction; Type 1 diabetes, uncontrolled, with neuropathy; Sacral pressure sore, unspecified pressure ulcer stage; Tetraplegia    collagenase (SANTYL) ointment Apply 1 application topically daily. Qty: 90 g, Refills: 5    dexlansoprazole (DEXILANT) 60 MG capsule Take 1 capsule (60 mg total) by mouth daily. Qty: 30 capsule, Refills: 0    !! feeding supplement, GLUCERNA SHAKE, (GLUCERNA SHAKE) LIQD Take 237 mLs by mouth 3 (three) times daily between meals. Qty: 237 mL, Refills: 90    glucagon (GLUCAGON EMERGENCY) 1 MG injection Inject 1 mg into the vein once as needed. Qty: 1 each, Refills: 12    loratadine (CLARITIN) 10 MG tablet Take 10 mg by mouth daily.       oxyCODONE-acetaminophen (PERCOCET) 10-325 MG per tablet Take 1 tablet by mouth every 8 (eight) hours. Qty: 90 tablet, Refills: 0   Associated Diagnoses: Spinal cord infarction; Type 1 diabetes, uncontrolled, with neuropathy; Sacral pressure sore, unspecified pressure ulcer stage; Tetraplegia    potassium chloride SA (KLOR-CON M20) 20 MEQ tablet Take 1 tablet (20 mEq total) by mouth daily. Qty: 90 tablet, Refills: 3    !! pregabalin (LYRICA) 100 MG capsule Take 1-2 capsules (100-200 mg total) by mouth 4 (four) times daily. Take 100 mg by mouth in the morning, take 100 mg by mouth in the afternoon, take 100 mg by mouth in the evening and take 200 mg by mouth at bedtime. Qty: 150 capsule, Refills: 4   Associated Diagnoses: Spinal cord infarction; Type 1 diabetes, uncontrolled, with neuropathy; Sacral pressure sore, unspecified pressure ulcer stage; Tetraplegia     !! - Potential duplicate medications found. Please discuss with provider.     Allergies  Allergen Reactions  . Cefuroxime Axetil Anaphylaxis  . Ertapenem Other (See Comments)    Rash and confusion  . Morphine And Related Other (See Comments)    Changed mental status, confusion, headache, visual hallucination  . Penicillins Anaphylaxis and Other (See Comments)    ?can take amoxicillin?  . Sulfa Antibiotics Anaphylaxis, Shortness Of Breath and Other (See Comments)  . Tessalon [Benzonatate] Anaphylaxis  . Shellfish Allergy Itching and Other (See Comments)    Took benadryl to alleviate reaction   Follow-up Information    Follow up with [facility]   Why:  [name] will resume patient care , will contact you with start date and time.   Contact information:   [address] [place] [address] [phone]       Follow up with FRY,STEPHEN A, MD In 1 week.   Specialty:  Family Medicine   Contact information:   [place] [address] [phone]       Follow up with [name], MD In 2 weeks.   Specialty:  Infectious Diseases   Contact information:   [address] [place] [address] [phone]       Follow up with Laverda Page, MD In 1 week.   Specialty:  Cardiology   Why:  to discuss holter monitor due to itnermittent palpitation/chest discomfort   Contact information:   [address] [place] [address] [phone]  Follow up with MACDIARMID,SCOTT A, MD.   Specialty:  Urology   Why:  to schedule an appointment in 2-3 weeks.   Contact information:   [place] [address] [phone]        The results of significant diagnostics from this hospitalization (including imaging, microbiology, ancillary and laboratory) are listed below for reference.    Significant Diagnostic Studies: Ct Head Wo Contrast  12/01/2014   CLINICAL DATA:  Allergic reaction to pain meds withdrawal. Pt has had hallucinations and altered mental status x 3 days per mom  EXAM: CT HEAD WITHOUT CONTRAST  TECHNIQUE: Contiguous axial images were obtained from the base of the skull through the vertex without intravenous contrast.  COMPARISON:  Head CT 07/29/2014, MRI 11 07/2014  FINDINGS: No acute intracranial hemorrhage. No focal mass lesion. No CT evidence of acute infarction. No midline shift or mass effect. No hydrocephalus. Basilar cisterns are patent. Paranasal sinuses and mastoid air cells are clear.  IMPRESSION: History no acute   Electronically Signed   By: Suzy Bouchard M.D.   On: 12/01/2014 18:32   Ct Chest W Contrast  12/11/2014   CLINICAL DATA:  Initial evaluation for fever, patient is paraplegic due to spinal cord infarct  EXAM: CT CHEST, ABDOMEN, AND PELVIS WITH CONTRAST  TECHNIQUE: Multidetector CT imaging of the chest, abdomen and pelvis was performed following the standard protocol during bolus administration of intravenous contrast.  CONTRAST:  145mL OMNIPAQUE IOHEXOL 300 MG/ML  SOLN   COMPARISON:  Multiple prior studies including 11/15/2014 CT thorax  FINDINGS: CT CHEST FINDINGS  Right lower lobe fairly extensive consolidation with air bronchograms. In the left lower lobe, consolidation is again identified, showing moderate improvement when compared to the prior study. 11 mm triangular right upper lobe opacity series 3, image number 11 and 12, stable from 11/15/2014. Faint patchy right upper lobe infiltrate new from prior study.  Residual thymic tissue anterior mediastinum. Numerous likely reactive mediastinal lymph nodes, similar to prior study. Patulous fluid-filled esophagus noted.  Small right pleural effusion. Tiny left pleural effusions similar to prior study.  CT ABDOMEN AND PELVIS FINDINGS  Significant gastric distention with air and fluid. Liver and gallbladder are normal. Spleen is normal. Pancreas is normal.  Adrenal glands are normal. No evidence of hydronephrosis on the left side, although the urothelium of the renal pelvis and ureter enhances mildly. Mild perinephric inflammatory change on the left. On the right, there is evidence of medial lower pole scarring. Mild urothelial enhancement of the renal pelvis and ureter identified on the right as well.  Numerous phleboliths along the course of the distal ureters. There is a suprapubic catheter in the bladder. There is bladder wall thickening.  Left lower quadrant ostomy noted. Nonobstructive bowel gas pattern. Distal colon decompressed.  Decubitus ulcer identified over the inferior sacrum and coccyx. Also evidence of decubitus ulcer with gas within it over the right posterior ischium with evidence of osteomyelitis involving the ischium, consisting of cortical destruction periosteal reaction. No definite osteomyelitis involving the inferior sacrum and coccyx although ulcer extends deep down nearly to the level of bone.  IMPRESSION: 1. Improving left lower lobe pneumonia 2. Pneumonia right lower lobe worse. New faint right upper lobe  infiltrate consistent with pneumonia as well. 3. 11 mm nodular opacity right upper lobe. Follow-up chest CT in 3-6 months recommended. This recommendation follows the consensus statement: Guidelines for Management of Small Pulmonary Nodules Detected on CT Scans: A Statement from the [employer] as  published in Radiology 2005; 237:395-400. 4. Distended stomach similar to prior studies likely due to gastroparesis. 5. Suprapubic catheter with bladder wall thickening. Enhancement of bilateral urothelium. Cannot exclude possibility of ascending infection causing pyelonephritis. 6. Decubitus ulcers as described above. Appearance consistent with osteomyelitis right ischium.   Electronically Signed   By: Skipper Cliche M.D.   On: 12/11/2014 16:54   Ct Abdomen Pelvis W Contrast  12/11/2014   CLINICAL DATA:  Initial evaluation for fever, patient is paraplegic due to spinal cord infarct  EXAM: CT CHEST, ABDOMEN, AND PELVIS WITH CONTRAST  TECHNIQUE: Multidetector CT imaging of the chest, abdomen and pelvis was performed following the standard protocol during bolus administration of intravenous contrast.  CONTRAST:  158mL OMNIPAQUE IOHEXOL 300 MG/ML  SOLN  COMPARISON:  Multiple prior studies including 11/15/2014 CT thorax  FINDINGS: CT CHEST FINDINGS  Right lower lobe fairly extensive consolidation with air bronchograms. In the left lower lobe, consolidation is again identified, showing moderate improvement when compared to the prior study. 11 mm triangular right upper lobe opacity series 3, image number 11 and 12, stable from 11/15/2014. Faint patchy right upper lobe infiltrate new from prior study.  Residual thymic tissue anterior mediastinum. Numerous likely reactive mediastinal lymph nodes, similar to prior study. Patulous fluid-filled esophagus noted.  Small right pleural effusion. Tiny left pleural effusions similar to prior study.  CT ABDOMEN AND PELVIS FINDINGS  Significant gastric distention with air and  fluid. Liver and gallbladder are normal. Spleen is normal. Pancreas is normal.  Adrenal glands are normal. No evidence of hydronephrosis on the left side, although the urothelium of the renal pelvis and ureter enhances mildly. Mild perinephric inflammatory change on the left. On the right, there is evidence of medial lower pole scarring. Mild urothelial enhancement of the renal pelvis and ureter identified on the right as well.  Numerous phleboliths along the course of the distal ureters. There is a suprapubic catheter in the bladder. There is bladder wall thickening.  Left lower quadrant ostomy noted. Nonobstructive bowel gas pattern. Distal colon decompressed.  Decubitus ulcer identified over the inferior sacrum and coccyx. Also evidence of decubitus ulcer with gas within it over the right posterior ischium with evidence of osteomyelitis involving the ischium, consisting of cortical destruction periosteal reaction. No definite osteomyelitis involving the inferior sacrum and coccyx although ulcer extends deep down nearly to the level of bone.  IMPRESSION: 1. Improving left lower lobe pneumonia 2. Pneumonia right lower lobe worse. New faint right upper lobe infiltrate consistent with pneumonia as well. 3. 11 mm nodular opacity right upper lobe. Follow-up chest CT in 3-6 months recommended. This recommendation follows the consensus statement: Guidelines for Management of Small Pulmonary Nodules Detected on CT Scans: A Statement from the [employer] as published in Radiology 2005; 237:395-400. 4. Distended stomach similar to prior studies likely due to gastroparesis. 5. Suprapubic catheter with bladder wall thickening. Enhancement of bilateral urothelium. Cannot exclude possibility of ascending infection causing pyelonephritis. 6. Decubitus ulcers as described above. Appearance consistent with osteomyelitis right ischium.   Electronically Signed   By: Skipper Cliche M.D.   On: 12/11/2014 16:54   Dg Chest  Port 1 View  12/18/2014   CLINICAL DATA:  29 year old male with a history of shortness of breath.  EXAM: PORTABLE CHEST - 1 VIEW  COMPARISON:  Multiple prior chest x-ray, most recent 12/17/2014, 12/16/2014  FINDINGS: Cardiomediastinal silhouette unchanged in size and contour.  Airspace disease persists in the bilateral lower lungs, though overall improved  from the comparison plain film.  No large pleural effusion.  No pneumothorax.  Unchanged position of the endotracheal tube, which appears to terminate approximately 4.9 cm above the carina.  Enteric tube projects over the mediastinum.  Unchanged position of right approach PICC.  IMPRESSION: Improving bibasilar airspace disease.  Unchanged position of endotracheal tube enteric tube and right upper extremity PICC.  Signed,  Dulcy Fanny. Earleen Newport, DO  [facility]  [facility]   Electronically Signed   By: Corrie Mckusick D.O.   On: 12/18/2014 07:18   Dg Chest Port 1 View  12/17/2014   CLINICAL DATA:  Respiratory failure.  EXAM: PORTABLE CHEST - 1 VIEW  COMPARISON:  12/16/2014.  FINDINGS: Tracheostomy tube, NG tube, right PICC line in stable position. Persistent prominent bibasilar pulmonary infiltrates. Heart size stable. No pleural effusion or pneumothorax. No acute bony abnormality .  IMPRESSION: 1. Lines and tubes in stable position. 2. Persistent prominent bibasilar pulmonary infiltrates most consistent with pneumonia. No significant interval clearing.   Electronically Signed   By: Marcello Moores  Register   On: 12/17/2014 07:49   Dg Chest Port 1 View  12/16/2014   CLINICAL DATA:  Ventilator dependent respiratory failure  EXAM: PORTABLE CHEST - 1 VIEW  COMPARISON:  Portable chest x-ray of 12/15/2014  FINDINGS: There is slight worsening of opacity at the right lung base with little change in the opacity medially at the left lung base most consistent with pneumonia. No definite effusion is seen. The tip of the endotracheal tube  is approximately 5.5 cm above the carina. Right central venous line tip overlies the mid lower SVC and no pneumothorax is seen. An NG tube is present.  IMPRESSION: Slight worsening of lower lobe infiltrates most consistent with pneumonia.   Electronically Signed   By: [name]   On: 12/16/2014 07:30   Dg Chest Port 1 View  12/15/2014   CLINICAL DATA:  Respiratory failure  EXAM: PORTABLE CHEST - 1 VIEW  COMPARISON:  12/14/2014  FINDINGS: The cardiac shadow is stable. Bibasilar infiltrates are again identified and stable. A right-sided PICC line, endotracheal tube and nasogastric catheter are all again seen and stable. No acute abnormality is noted.  IMPRESSION: Stable bilateral lower lobe infiltrates.   Electronically Signed   By: Inez Catalina M.D.   On: 12/15/2014 07:35   Dg Chest Port 1 View  12/14/2014   CLINICAL DATA:  29 year old male intubated. Respiratory distress. Initial encounter.  EXAM: PORTABLE CHEST - 1 VIEW  COMPARISON:  12/14/2014 and earlier.  FINDINGS: Portable AP supine view at 1239 hrs. Endotracheal tube tip in good position between the level the clavicles and carina. Enteric tube placed in courses to the left upper quadrant, tip not included. Stable right PICC line.  Stable lung volumes. Bilateral lower lobe airspace disease appears not significantly changed. No pneumothorax, large effusion or edema. Stable cardiac size and mediastinal contours.  IMPRESSION: 1. Endotracheal tube and enteric tube appear probe early place. 2. Bilateral lower lobe pneumonia not significantly changed.   Electronically Signed   By: Genevie Ann M.D.   On: 12/14/2014 13:20   Dg Chest Port 1 View  12/14/2014   CLINICAL DATA:  Sudden onset respiratory distress. Decreased oxygen saturation.  EXAM: PORTABLE CHEST - 1 VIEW  COMPARISON:  CT 12/11/2014  FINDINGS: Right upper extremity PICC line tip projects over the superior vena cava. Monitoring leads overlie the patient. Stable enlarged cardiac and mediastinal  contours. Heterogeneous opacities right lung  base, increased. Persistent minimal heterogeneous opacities left lung base. Small right pleural effusion. No definite pneumothorax.  IMPRESSION: Increased right lung base heterogeneous opacities concerning for pneumonia. There is an adjacent small pleural effusion.  Persistent heterogeneous opacities left lung base concerning for infection. Probable small left pleural effusion.   Electronically Signed   By: Lovey Newcomer M.D.   On: 12/14/2014 12:37   Dg Chest Port 1 View  12/10/2014   CLINICAL DATA:  Fevers, weakness  EXAM: PORTABLE CHEST - 1 VIEW  COMPARISON:  2/20 5/6 seen  FINDINGS: Cardiac shadow is prominent but accentuated by the portable technique. A right-sided PICC line is again seen in the superior right atrium. The lungs are well aerated bilaterally. Increased density is noted in the left retrocardiac region likely representing acute infiltrate.  IMPRESSION: Left retrocardiac infiltrate.   Electronically Signed   By: Inez Catalina M.D.   On: 12/10/2014 17:23   Dg Chest Port 1 View  12/02/2014   CLINICAL DATA:  Shortness of breath.  PICC in place.  EXAM: PORTABLE CHEST - 1 VIEW  COMPARISON:  11/19/2014  FINDINGS: Tip of the right upper extremity PICC in the mid-distal SVC. Cardiomediastinal contours are normal. Decreased left basilar airspace disease with minimal residual linear opacities. The lungs are otherwise clear. There is no pleural effusion or pneumothorax. Unchanged appearance of the right distal clavicle with probable posttraumatic deformity.  IMPRESSION: 1. Tip of the right upper extremity PICC in the mid distal SVC. 2. Improved left basilar aeration with minimal residual linear opacity, likely atelectasis.   Electronically Signed   By: Jeb Levering M.D.   On: 12/02/2014 03:28    Microbiology: No results found for this or any previous visit (from the past 240 hour(s)).   Labs: Basic Metabolic Panel:  Recent Labs Lab 12/18/14 0313  12/19/14 0335 12/20/14 0415 12/21/14 0710 12/21/14 2229 12/22/14 0810 12/23/14 0532 12/24/14 0640  NA 144 140 138 140  --  135 136 136  K 3.6 3.7 4.2 3.8  --  4.0 4.0 4.4  CL 115* 110 107 106  --  104 105 106  CO2 24 25 27 30   --  27 26 25   GLUCOSE 222* 167* 225* 131* 559* 228* 268* 346*  BUN 23 16 12 11   --  15 18 27*  CREATININE 0.84 0.77 0.76 0.57  --  0.69 0.68 0.70  CALCIUM 9.8 9.5 9.5 9.4  --  9.6 9.3 9.2  MG 1.4* 1.9  --   --   --   --   --   --   PHOS 3.0  --   --   --   --   --   --   --    Liver Function Tests: No results for input(s): AST, ALT, ALKPHOS, BILITOT, PROT, ALBUMIN in the last 168 hours. No results for input(s): LIPASE, AMYLASE in the last 168 hours. No results for input(s): AMMONIA in the last 168 hours. CBC:  Recent Labs Lab 12/18/14 0313 12/21/14 0710 12/22/14 0810 12/23/14 0532 12/24/14 0640  WBC 19.1* 10.3 10.5 8.8 11.6*  HGB 7.4* 7.4* 7.5* 7.3* 9.3*  HCT 23.2* 23.7* 24.1* 23.9* 29.2*  MCV 83.8 85.9 85.8 86.9 86.4  PLT 586* 605* 627* 613* 642*   Cardiac Enzymes: No results for input(s): CKTOTAL, CKMB, CKMBINDEX, TROPONINI in the last 168 hours. BNP: BNP (last 3 results) No results for input(s): BNP in the last 8760 hours.  ProBNP (last 3 results)  Recent Labs  01/29/14 2113  PROBNP 1343.0*    CBG:  Recent Labs Lab 12/23/14 1222 12/23/14 1733 12/23/14 2213 12/24/14 0849 12/24/14 1139  GLUCAP 240* 215* 304* 356* 256*       Signed:  Eliodoro Gullett A  [facility] 12/24/2014, 12:49 PM

## 2014-12-24 NOTE — Progress Notes (Signed)
Inpatient Diabetes Program Recommendations  AACE/ADA: New Consensus Statement on Inpatient Glycemic Control (2013)  Target Ranges:  Prepandial:   less than 140 mg/dL      Peak postprandial:   less than 180 mg/dL (1-2 hours)      Critically ill patients:  140 - 180 mg/dL   Results for Jason [name], Jason [name] (MRN [id number]) as of 12/24/2014 07:45  Ref. Range 12/23/2014 07:28 12/23/2014 12:22 12/23/2014 17:33 12/23/2014 22:13  Glucose-Capillary Latest Range: 70-99 mg/dL 244 (H) 240 (H) 215 (H) 304 (H)    Diabetes history: Type 1 diabetes Outpatient Diabetes medications: Novolog 4 units tid with meals, Lantus 25 units daily Current orders for Inpatient glycemic control:  Novolog sensitive tid with meals, Lantus 28 units q HS  Inpatient Diabetes Program Recommendations  Insulin - Basal: Patient's lab glucose was 346 mg/dl. Please increase basal insulin to Lantus 32 units QHS. Correction (SSI): Please consider Novolog 0-5 units QHS for bedtime coverage. Insulin - Meal Coverage: Due to Type 1 diabetes patient will need carbohydrate coverage. May consider adding Novolog 4 units tid with meals in addition to correction-Hold if patient eats less than 50%.  Thanks,  Tama Headings RN, MSN, [facility] Inpatient Diabetes Coordinator Team Pager [phone]

## 2014-12-24 NOTE — Progress Notes (Signed)
Dced home by [facility]; Mom present. PICC line flushed per protocol by IV team nurse

## 2014-12-25 LAB — TYPE AND SCREEN
ABO/RH(D): O POS
ANTIBODY SCREEN: NEGATIVE
Unit division: 0
Unit division: 0

## 2014-12-27 ENCOUNTER — Telehealth: Payer: Self-pay | Admitting: Family Medicine

## 2014-12-27 ENCOUNTER — Telehealth: Payer: Self-pay | Admitting: Internal Medicine

## 2014-12-27 NOTE — Telephone Encounter (Signed)
   Reason for call:   I spoke to [name], an Therapist, sports with [name] regarding [name] at 10:35  PM indicating that that patient had labs drawn today, and his serum glucose was 700.   Pertinent Data:   The patient is on 28u Lantus qhs and Novolog mealtime coverage TID of 4u.   Per RN, the patient does check his CBGs at home, but she is unsure of recent CBG levels  The patient's PCP is [name], a Family Medicine Physician with [name]  The patient is not an IMTS patient and has only been seen by ID as an inpatient on the hospitalist service earlier this month, who subsequently signed off and recommended PRN f/u as an outpatient.    Assessment / Plan / Recommendations:   I recommended that the RN call the patient to have him check his CBG level, and if his blood sugar remains elevated, he should call his PCP's office for further recommendations, as he is not an ID nor IMTS patient, and we will not be following him in the outpatient setting.   As always, pt is advised that if symptoms worsen or new symptoms arise, he should be advised to go to an urgent care facility or to to ER for further evaluation.   [name], MD   12/27/2014, 10:34 PM

## 2014-12-27 NOTE — Telephone Encounter (Signed)
I spoke with Jason [name] and she is going to contact wound care clinic to get orders to start back care, pt just came back home from hospital.

## 2014-12-27 NOTE — Telephone Encounter (Signed)
Helene Kelp would like to speak with you directly about the patient.  She saw him on Saturday.

## 2014-12-27 NOTE — Telephone Encounter (Signed)
I spoke with Helene Kelp.

## 2014-12-27 NOTE — Telephone Encounter (Signed)
Jason [name] please return teresa call today

## 2014-12-29 ENCOUNTER — Encounter: Payer: Self-pay | Admitting: Family Medicine

## 2014-12-29 ENCOUNTER — Ambulatory Visit (INDEPENDENT_AMBULATORY_CARE_PROVIDER_SITE_OTHER): Payer: Medicaid Other | Admitting: Family Medicine

## 2014-12-29 VITALS — BP 110/68 | Temp 98.8°F

## 2014-12-29 DIAGNOSIS — R7881 Bacteremia: Secondary | ICD-10-CM | POA: Diagnosis not present

## 2014-12-29 DIAGNOSIS — L89309 Pressure ulcer of unspecified buttock, unspecified stage: Secondary | ICD-10-CM | POA: Diagnosis not present

## 2014-12-29 DIAGNOSIS — A4150 Gram-negative sepsis, unspecified: Secondary | ICD-10-CM

## 2014-12-29 DIAGNOSIS — J690 Pneumonitis due to inhalation of food and vomit: Secondary | ICD-10-CM

## 2014-12-29 DIAGNOSIS — J9601 Acute respiratory failure with hypoxia: Secondary | ICD-10-CM

## 2014-12-29 DIAGNOSIS — M869 Osteomyelitis, unspecified: Secondary | ICD-10-CM | POA: Diagnosis not present

## 2014-12-29 DIAGNOSIS — E1065 Type 1 diabetes mellitus with hyperglycemia: Secondary | ICD-10-CM

## 2014-12-29 MED ORDER — ONDANSETRON 8 MG PO TBDP: 8.0000 mg | ORAL_TABLET | Freq: Three times a day (TID) | ORAL | Status: DC | PRN

## 2014-12-29 NOTE — Progress Notes (Signed)
   Subjective:    Patient ID: Jason [name], male    DOB: 10-Sep-1986, 29 y.o.   MRN: [id number]  HPI Here with his mother to follow up on another hospital stay from 12-01-14 to 12-24-14 for encephalopathy secondary to Klebsiella bacteremia and a Burkhoderia UTI. He also developed a pneumonia and respiratory failure and was on a ventilator for a time. These issues were controlled with antibiotics under the care of Infectious Disease, and he was sent home on oral Flagyl and IV aztreonam with vancomycin. He was anemic and was transfused 2 units of PRBC. 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 He is to follow up with Dr. Linus Salmons in the ID clinic on 01-13-15, and he is to see the [facility] on 01-17-15. His mother is trying to maximize his nutrition and she is giving him whey protein shakes that contain 60 grams of protein twice a day. He does complain of frequent nausea without vomiting, and we believe some of this is due to the Flagyl he is taking. He had a fever of 101 degrees for 24 hours several days ago but has been afebrile ever since. His urine has been clear in the bag.   Review of Systems  Constitutional: Negative.   Respiratory: Negative.   Cardiovascular: Negative.   Gastrointestinal: Positive for nausea. Negative for vomiting, abdominal pain, diarrhea, constipation, blood in stool and abdominal distention.       Objective:   Physical Exam  Constitutional: He is oriented to person, place, and time.  In his wheelchair, alert  Cardiovascular: Normal rate, regular rhythm, normal heart sounds and intact distal pulses.   Pulmonary/Chest: Breath sounds normal. No respiratory distress. He has no wheezes. He has no rales.  Abdominal: Soft. Bowel sounds are normal. He exhibits no distension and no mass. There is no tenderness. There is no rebound and no guarding.  Neurological: He is alert and oriented to person, place, and time.           Assessment & Plan:  He seems to be doing fairly well at this point. We will get a CBC and a BMET today. Try Zofran ODT for the nausea.

## 2014-12-29 NOTE — Addendum Note (Signed)
Addended by: Townsend Roger D on: 12/29/2014 01:13 PM   Modules accepted: Orders

## 2014-12-29 NOTE — Progress Notes (Signed)
Pre visit review using our clinic review tool, if applicable. No additional management support is needed unless otherwise documented below in the visit note. 

## 2014-12-30 ENCOUNTER — Telehealth: Payer: Self-pay | Admitting: *Deleted

## 2014-12-30 NOTE — Telephone Encounter (Signed)
I discussed this with Jason [name] and his mother at his OV this week

## 2014-12-30 NOTE — Telephone Encounter (Signed)
Received labs from [facility] on fax this morning.  Labs drawn 3/21 at [facility], received on our fax 3/24 0902.  Glucose = 718, noted as called to and read back by Newt Lukes 2118.  No one by the name of Newt Lukes at this office. RN relayed information to Dr. Linus Salmons and Dr. Barbie Banner office via Dorna Bloom.   [name], RN

## 2014-12-30 NOTE — Telephone Encounter (Signed)
Jeani Hawking, pharmacist at [facility] spoke with patient's mother regarding glucose = 718.  Per Jeani Hawking, patient's mother stated he drank an entire carton of orange juice and that's why his glucose was so high. She reports she was able to get his sugar back to the 200's later that day.  Most recent reading was 183 per mother.

## 2015-01-03 ENCOUNTER — Telehealth: Payer: Self-pay | Admitting: Family Medicine

## 2015-01-03 NOTE — Telephone Encounter (Signed)
Pt dad called to ask for a breathing machine for [name]. Dad said pt has breathing problem at night.

## 2015-01-03 NOTE — Telephone Encounter (Signed)
I spoke with pt's dad and he is aware that Dr. Sarajane Jews will be out of the office until Monday 01/10/15, dad said okay.

## 2015-01-06 ENCOUNTER — Emergency Department (HOSPITAL_COMMUNITY): Payer: Medicaid Other

## 2015-01-06 ENCOUNTER — Inpatient Hospital Stay (HOSPITAL_COMMUNITY)
Admission: EM | Admit: 2015-01-06 | Discharge: 2015-01-14 | DRG: 871 | Disposition: A | Discharge status: Home Health | Payer: Medicaid Other | Attending: Internal Medicine | Admitting: Internal Medicine

## 2015-01-06 ENCOUNTER — Encounter (HOSPITAL_COMMUNITY): Payer: Self-pay | Admitting: Neurology

## 2015-01-06 DIAGNOSIS — M797 Fibromyalgia: Secondary | ICD-10-CM | POA: Diagnosis present

## 2015-01-06 DIAGNOSIS — A419 Sepsis, unspecified organism: Principal | ICD-10-CM

## 2015-01-06 DIAGNOSIS — G9511 Acute infarction of spinal cord (embolic) (nonembolic): Secondary | ICD-10-CM | POA: Diagnosis present

## 2015-01-06 DIAGNOSIS — T50995A Adverse effect of other drugs, medicaments and biological substances, initial encounter: Secondary | ICD-10-CM | POA: Diagnosis not present

## 2015-01-06 DIAGNOSIS — IMO0001 Reserved for inherently not codable concepts without codable children: Secondary | ICD-10-CM

## 2015-01-06 DIAGNOSIS — L02419 Cutaneous abscess of limb, unspecified: Secondary | ICD-10-CM

## 2015-01-06 DIAGNOSIS — E1040 Type 1 diabetes mellitus with diabetic neuropathy, unspecified: Secondary | ICD-10-CM | POA: Diagnosis present

## 2015-01-06 DIAGNOSIS — N390 Urinary tract infection, site not specified: Secondary | ICD-10-CM | POA: Diagnosis not present

## 2015-01-06 DIAGNOSIS — E119 Type 2 diabetes mellitus without complications: Secondary | ICD-10-CM | POA: Diagnosis not present

## 2015-01-06 DIAGNOSIS — R7401 Elevation of levels of liver transaminase levels: Secondary | ICD-10-CM

## 2015-01-06 DIAGNOSIS — L89622 Pressure ulcer of left heel, stage 2: Secondary | ICD-10-CM | POA: Diagnosis present

## 2015-01-06 DIAGNOSIS — K219 Gastro-esophageal reflux disease without esophagitis: Secondary | ICD-10-CM | POA: Diagnosis present

## 2015-01-06 DIAGNOSIS — R6510 Systemic inflammatory response syndrome (SIRS) of non-infectious origin without acute organ dysfunction: Secondary | ICD-10-CM

## 2015-01-06 DIAGNOSIS — R238 Other skin changes: Secondary | ICD-10-CM | POA: Diagnosis not present

## 2015-01-06 DIAGNOSIS — E872 Acidosis, unspecified: Secondary | ICD-10-CM

## 2015-01-06 DIAGNOSIS — J9602 Acute respiratory failure with hypercapnia: Secondary | ICD-10-CM

## 2015-01-06 DIAGNOSIS — T8351XA Infection and inflammatory reaction due to indwelling urinary catheter, initial encounter: Secondary | ICD-10-CM | POA: Diagnosis present

## 2015-01-06 DIAGNOSIS — Y95 Nosocomial condition: Secondary | ICD-10-CM | POA: Diagnosis present

## 2015-01-06 DIAGNOSIS — K2100 Gastro-esophageal reflux disease with esophagitis, without bleeding: Secondary | ICD-10-CM

## 2015-01-06 DIAGNOSIS — E11621 Type 2 diabetes mellitus with foot ulcer: Secondary | ICD-10-CM

## 2015-01-06 DIAGNOSIS — Z9359 Other cystostomy status: Secondary | ICD-10-CM | POA: Diagnosis not present

## 2015-01-06 DIAGNOSIS — R6520 Severe sepsis without septic shock: Secondary | ICD-10-CM

## 2015-01-06 DIAGNOSIS — J4520 Mild intermittent asthma, uncomplicated: Secondary | ICD-10-CM

## 2015-01-06 DIAGNOSIS — G9519 Other vascular myelopathies: Secondary | ICD-10-CM | POA: Diagnosis present

## 2015-01-06 DIAGNOSIS — E1010 Type 1 diabetes mellitus with ketoacidosis without coma: Secondary | ICD-10-CM | POA: Diagnosis present

## 2015-01-06 DIAGNOSIS — L0291 Cutaneous abscess, unspecified: Secondary | ICD-10-CM

## 2015-01-06 DIAGNOSIS — M869 Osteomyelitis, unspecified: Secondary | ICD-10-CM

## 2015-01-06 DIAGNOSIS — Z79899 Other long term (current) drug therapy: Secondary | ICD-10-CM

## 2015-01-06 DIAGNOSIS — G9340 Encephalopathy, unspecified: Secondary | ICD-10-CM

## 2015-01-06 DIAGNOSIS — Z8673 Personal history of transient ischemic attack (TIA), and cerebral infarction without residual deficits: Secondary | ICD-10-CM | POA: Diagnosis not present

## 2015-01-06 DIAGNOSIS — T83511A Infection and inflammatory reaction due to indwelling urethral catheter, initial encounter: Secondary | ICD-10-CM

## 2015-01-06 DIAGNOSIS — Z978 Presence of other specified devices: Secondary | ICD-10-CM

## 2015-01-06 DIAGNOSIS — L97509 Non-pressure chronic ulcer of other part of unspecified foot with unspecified severity: Secondary | ICD-10-CM

## 2015-01-06 DIAGNOSIS — M8660 Other chronic osteomyelitis, unspecified site: Secondary | ICD-10-CM | POA: Diagnosis present

## 2015-01-06 DIAGNOSIS — M868X8 Other osteomyelitis, other site: Secondary | ICD-10-CM | POA: Diagnosis not present

## 2015-01-06 DIAGNOSIS — R410 Disorientation, unspecified: Secondary | ICD-10-CM

## 2015-01-06 DIAGNOSIS — D72829 Elevated white blood cell count, unspecified: Secondary | ICD-10-CM

## 2015-01-06 DIAGNOSIS — N179 Acute kidney failure, unspecified: Secondary | ICD-10-CM | POA: Diagnosis present

## 2015-01-06 DIAGNOSIS — T798XXS Other early complications of trauma, sequela: Secondary | ICD-10-CM | POA: Diagnosis not present

## 2015-01-06 DIAGNOSIS — A4101 Sepsis due to Methicillin susceptible Staphylococcus aureus: Secondary | ICD-10-CM

## 2015-01-06 DIAGNOSIS — A498 Other bacterial infections of unspecified site: Secondary | ICD-10-CM

## 2015-01-06 DIAGNOSIS — R680 Hypothermia, not associated with low environmental temperature: Secondary | ICD-10-CM | POA: Diagnosis present

## 2015-01-06 DIAGNOSIS — I9589 Other hypotension: Secondary | ICD-10-CM | POA: Diagnosis present

## 2015-01-06 DIAGNOSIS — E08621 Diabetes mellitus due to underlying condition with foot ulcer: Secondary | ICD-10-CM

## 2015-01-06 DIAGNOSIS — M009 Pyogenic arthritis, unspecified: Secondary | ICD-10-CM

## 2015-01-06 DIAGNOSIS — E10649 Type 1 diabetes mellitus with hypoglycemia without coma: Secondary | ICD-10-CM | POA: Diagnosis present

## 2015-01-06 DIAGNOSIS — Z881 Allergy status to other antibiotic agents status: Secondary | ICD-10-CM

## 2015-01-06 DIAGNOSIS — E43 Unspecified severe protein-calorie malnutrition: Secondary | ICD-10-CM

## 2015-01-06 DIAGNOSIS — Y846 Urinary catheterization as the cause of abnormal reaction of the patient, or of later complication, without mention of misadventure at the time of the procedure: Secondary | ICD-10-CM | POA: Diagnosis present

## 2015-01-06 DIAGNOSIS — L139 Bullous disorder, unspecified: Secondary | ICD-10-CM

## 2015-01-06 DIAGNOSIS — Z933 Colostomy status: Secondary | ICD-10-CM | POA: Diagnosis not present

## 2015-01-06 DIAGNOSIS — Z833 Family history of diabetes mellitus: Secondary | ICD-10-CM | POA: Diagnosis not present

## 2015-01-06 DIAGNOSIS — L8990 Pressure ulcer of unspecified site, unspecified stage: Secondary | ICD-10-CM

## 2015-01-06 DIAGNOSIS — E11649 Type 2 diabetes mellitus with hypoglycemia without coma: Secondary | ICD-10-CM | POA: Diagnosis not present

## 2015-01-06 DIAGNOSIS — J90 Pleural effusion, not elsewhere classified: Secondary | ICD-10-CM

## 2015-01-06 DIAGNOSIS — B3749 Other urogenital candidiasis: Secondary | ICD-10-CM | POA: Diagnosis present

## 2015-01-06 DIAGNOSIS — I2699 Other pulmonary embolism without acute cor pulmonale: Secondary | ICD-10-CM | POA: Diagnosis not present

## 2015-01-06 DIAGNOSIS — Z87891 Personal history of nicotine dependence: Secondary | ICD-10-CM | POA: Diagnosis not present

## 2015-01-06 DIAGNOSIS — M8618 Other acute osteomyelitis, other site: Secondary | ICD-10-CM | POA: Diagnosis not present

## 2015-01-06 DIAGNOSIS — Z452 Encounter for adjustment and management of vascular access device: Secondary | ICD-10-CM

## 2015-01-06 DIAGNOSIS — E162 Hypoglycemia, unspecified: Secondary | ICD-10-CM | POA: Diagnosis not present

## 2015-01-06 DIAGNOSIS — L97519 Non-pressure chronic ulcer of other part of right foot with unspecified severity: Secondary | ICD-10-CM | POA: Diagnosis present

## 2015-01-06 DIAGNOSIS — E875 Hyperkalemia: Secondary | ICD-10-CM

## 2015-01-06 DIAGNOSIS — Z4659 Encounter for fitting and adjustment of other gastrointestinal appliance and device: Secondary | ICD-10-CM

## 2015-01-06 DIAGNOSIS — D649 Anemia, unspecified: Secondary | ICD-10-CM | POA: Diagnosis present

## 2015-01-06 DIAGNOSIS — J9601 Acute respiratory failure with hypoxia: Secondary | ICD-10-CM

## 2015-01-06 DIAGNOSIS — N319 Neuromuscular dysfunction of bladder, unspecified: Secondary | ICD-10-CM | POA: Diagnosis present

## 2015-01-06 DIAGNOSIS — R509 Fever, unspecified: Secondary | ICD-10-CM

## 2015-01-06 DIAGNOSIS — K3184 Gastroparesis: Secondary | ICD-10-CM

## 2015-01-06 DIAGNOSIS — B9561 Methicillin susceptible Staphylococcus aureus infection as the cause of diseases classified elsewhere: Secondary | ICD-10-CM

## 2015-01-06 DIAGNOSIS — IMO0002 Reserved for concepts with insufficient information to code with codable children: Secondary | ICD-10-CM

## 2015-01-06 DIAGNOSIS — Z8614 Personal history of Methicillin resistant Staphylococcus aureus infection: Secondary | ICD-10-CM | POA: Diagnosis not present

## 2015-01-06 DIAGNOSIS — M25451 Effusion, right hip: Secondary | ICD-10-CM

## 2015-01-06 DIAGNOSIS — N189 Chronic kidney disease, unspecified: Secondary | ICD-10-CM

## 2015-01-06 DIAGNOSIS — G8250 Quadriplegia, unspecified: Secondary | ICD-10-CM

## 2015-01-06 DIAGNOSIS — R502 Drug induced fever: Secondary | ICD-10-CM | POA: Diagnosis not present

## 2015-01-06 DIAGNOSIS — R7881 Bacteremia: Secondary | ICD-10-CM

## 2015-01-06 DIAGNOSIS — R4182 Altered mental status, unspecified: Secondary | ICD-10-CM

## 2015-01-06 DIAGNOSIS — R197 Diarrhea, unspecified: Secondary | ICD-10-CM

## 2015-01-06 DIAGNOSIS — L97529 Non-pressure chronic ulcer of other part of left foot with unspecified severity: Secondary | ICD-10-CM | POA: Diagnosis present

## 2015-01-06 DIAGNOSIS — E1143 Type 2 diabetes mellitus with diabetic autonomic (poly)neuropathy: Secondary | ICD-10-CM

## 2015-01-06 DIAGNOSIS — L89154 Pressure ulcer of sacral region, stage 4: Secondary | ICD-10-CM | POA: Diagnosis present

## 2015-01-06 DIAGNOSIS — Z794 Long term (current) use of insulin: Secondary | ICD-10-CM

## 2015-01-06 DIAGNOSIS — R739 Hyperglycemia, unspecified: Secondary | ICD-10-CM

## 2015-01-06 DIAGNOSIS — E1110 Type 2 diabetes mellitus with ketoacidosis without coma: Secondary | ICD-10-CM | POA: Diagnosis present

## 2015-01-06 DIAGNOSIS — M1990 Unspecified osteoarthritis, unspecified site: Secondary | ICD-10-CM | POA: Diagnosis present

## 2015-01-06 DIAGNOSIS — J45909 Unspecified asthma, uncomplicated: Secondary | ICD-10-CM | POA: Diagnosis present

## 2015-01-06 DIAGNOSIS — L8994 Pressure ulcer of unspecified site, stage 4: Secondary | ICD-10-CM | POA: Diagnosis not present

## 2015-01-06 DIAGNOSIS — E1011 Type 1 diabetes mellitus with ketoacidosis with coma: Secondary | ICD-10-CM

## 2015-01-06 DIAGNOSIS — J189 Pneumonia, unspecified organism: Secondary | ICD-10-CM | POA: Diagnosis present

## 2015-01-06 DIAGNOSIS — B961 Klebsiella pneumoniae [K. pneumoniae] as the cause of diseases classified elsewhere: Secondary | ICD-10-CM

## 2015-01-06 DIAGNOSIS — E10621 Type 1 diabetes mellitus with foot ulcer: Secondary | ICD-10-CM

## 2015-01-06 DIAGNOSIS — E1065 Type 1 diabetes mellitus with hyperglycemia: Secondary | ICD-10-CM

## 2015-01-06 DIAGNOSIS — B9689 Other specified bacterial agents as the cause of diseases classified elsewhere: Secondary | ICD-10-CM | POA: Diagnosis not present

## 2015-01-06 DIAGNOSIS — R740 Nonspecific elevation of levels of transaminase and lactic acid dehydrogenase [LDH]: Secondary | ICD-10-CM

## 2015-01-06 DIAGNOSIS — L03119 Cellulitis of unspecified part of limb: Secondary | ICD-10-CM

## 2015-01-06 LAB — GLUCOSE, CAPILLARY
GLUCOSE-CAPILLARY: 327 mg/dL — AB (ref 70–99)
Glucose-Capillary: 466 mg/dL — ABNORMAL HIGH (ref 70–99)
Glucose-Capillary: 428 mg/dL — ABNORMAL HIGH (ref 70–99)
Glucose-Capillary: 359 mg/dL — ABNORMAL HIGH (ref 70–99)

## 2015-01-06 LAB — CBC
HCT: 26.8 % — ABNORMAL LOW (ref 39.0–52.0)
Hemoglobin: 8.1 g/dL — ABNORMAL LOW (ref 13.0–17.0)
MCH: 26.1 pg (ref 26.0–34.0)
MCHC: 30.2 g/dL (ref 30.0–36.0)
MCV: 86.5 fL (ref 78.0–100.0)
Platelets: 608 10*3/uL — ABNORMAL HIGH (ref 150–400)
RBC: 3.1 MIL/uL — AB (ref 4.22–5.81)
RDW: 15.7 % — ABNORMAL HIGH (ref 11.5–15.5)
WBC: 18.8 10*3/uL — ABNORMAL HIGH (ref 4.0–10.5)

## 2015-01-06 LAB — BASIC METABOLIC PANEL
ANION GAP: 5 (ref 5–15)
Anion gap: 7 (ref 5–15)
BUN: 22 mg/dL (ref 6–23)
BUN: 21 mg/dL (ref 6–23)
CALCIUM: 9.3 mg/dL (ref 8.4–10.5)
CO2: 17 mmol/L — AB (ref 19–32)
CO2: 20 mmol/L (ref 19–32)
Calcium: 9.4 mg/dL (ref 8.4–10.5)
Chloride: 109 mmol/L (ref 96–112)
Chloride: 110 mmol/L (ref 96–112)
Creatinine, Ser: 1.45 mg/dL — ABNORMAL HIGH (ref 0.50–1.35)
Creatinine, Ser: 1.37 mg/dL — ABNORMAL HIGH (ref 0.50–1.35)
GFR calc Af Amer: 74 mL/min — ABNORMAL LOW (ref >90)
GFR calc non Af Amer: 64 mL/min — ABNORMAL LOW (ref >90)
GFR, EST AFRICAN AMERICAN: 79 mL/min — AB (ref >90)
GFR, EST NON AFRICAN AMERICAN: 68 mL/min — AB (ref >90)
GLUCOSE: 395 mg/dL — AB (ref 70–99)
Glucose, Bld: 469 mg/dL — ABNORMAL HIGH (ref 70–99)
POTASSIUM: 4.1 mmol/L (ref 3.5–5.1)
Potassium: 3.7 mmol/L (ref 3.5–5.1)
SODIUM: 133 mmol/L — AB (ref 135–145)
SODIUM: 135 mmol/L (ref 135–145)

## 2015-01-06 LAB — URINALYSIS, ROUTINE W REFLEX MICROSCOPIC
BILIRUBIN URINE: NEGATIVE
Glucose, UA: >1000 mg/dL — AB
KETONES UR: NEGATIVE mg/dL
Nitrite: NEGATIVE
Protein, ur: 30 mg/dL — AB
SPECIFIC GRAVITY, URINE: 1.02 (ref 1.005–1.030)
UROBILINOGEN UA: 0.2 mg/dL (ref 0.0–1.0)
pH: 5 (ref 5.0–8.0)

## 2015-01-06 LAB — I-STAT TROPONIN, ED: Troponin i, poc: 0 ng/mL (ref 0.00–0.08)

## 2015-01-06 LAB — I-STAT ARTERIAL BLOOD GAS, ED
Acid-base deficit: 11 mmol/L — ABNORMAL HIGH (ref 0.0–2.0)
Bicarbonate: 15.1 mEq/L — ABNORMAL LOW (ref 20.0–24.0)
O2 Saturation: 92 %
PH ART: 7.287 — AB (ref 7.350–7.450)
Patient temperature: 97.5
TCO2: 16 mmol/L (ref 0–100)
pCO2 arterial: 31.4 mmHg — ABNORMAL LOW (ref 35.0–45.0)
pO2, Arterial: 67 mmHg — ABNORMAL LOW (ref 80.0–100.0)

## 2015-01-06 LAB — COMPREHENSIVE METABOLIC PANEL
ALT: 10 U/L (ref 0–53)
ANION GAP: 9 (ref 5–15)
AST: 8 U/L (ref 0–37)
Albumin: 2.2 g/dL — ABNORMAL LOW (ref 3.5–5.2)
Alkaline Phosphatase: 93 U/L (ref 39–117)
BUN: 23 mg/dL (ref 6–23)
CO2: 17 mmol/L — ABNORMAL LOW (ref 19–32)
Calcium: 9.9 mg/dL (ref 8.4–10.5)
Chloride: 104 mmol/L (ref 96–112)
Creatinine, Ser: 1.73 mg/dL — ABNORMAL HIGH (ref 0.50–1.35)
GFR calc Af Amer: 59 mL/min — ABNORMAL LOW (ref >90)
GFR calc non Af Amer: 51 mL/min — ABNORMAL LOW (ref >90)
Glucose, Bld: 579 mg/dL (ref 70–99)
POTASSIUM: 4.6 mmol/L (ref 3.5–5.1)
Sodium: 130 mmol/L — ABNORMAL LOW (ref 135–145)
TOTAL PROTEIN: 7.3 g/dL (ref 6.0–8.3)
Total Bilirubin: 0.5 mg/dL (ref 0.3–1.2)

## 2015-01-06 LAB — MAGNESIUM: MAGNESIUM: 1.6 mg/dL (ref 1.5–2.5)

## 2015-01-06 LAB — URINE MICROSCOPIC-ADD ON

## 2015-01-06 LAB — CLOSTRIDIUM DIFFICILE BY PCR: CDIFFPCR: NEGATIVE

## 2015-01-06 LAB — RAPID URINE DRUG SCREEN, HOSP PERFORMED
Amphetamines: NOT DETECTED
BARBITURATES: NOT DETECTED
BENZODIAZEPINES: NOT DETECTED
Cocaine: NOT DETECTED
OPIATES: POSITIVE — AB
Tetrahydrocannabinol: NOT DETECTED

## 2015-01-06 LAB — LACTIC ACID, PLASMA: LACTIC ACID, VENOUS: 1.1 mmol/L (ref 0.5–2.0)

## 2015-01-06 LAB — PHOSPHORUS: PHOSPHORUS: 4.5 mg/dL (ref 2.3–4.6)

## 2015-01-06 LAB — AMMONIA: Ammonia: 39 umol/L — ABNORMAL HIGH (ref 11–32)

## 2015-01-06 MED ORDER — SALINE SPRAY 0.65 % NA SOLN
1.0000 | NASAL | Status: DC | PRN
  Filled 2015-01-06: qty 44

## 2015-01-06 MED ORDER — MIDODRINE HCL 5 MG PO TABS
5.0000 mg | ORAL_TABLET | ORAL | Status: DC | PRN
  Filled 2015-01-06: qty 1

## 2015-01-06 MED ORDER — PREGABALIN 50 MG PO CAPS: 100.0000 mg | ORAL_CAPSULE | Freq: Four times a day (QID) | ORAL | Status: DC

## 2015-01-06 MED ORDER — POTASSIUM CHLORIDE CRYS ER 20 MEQ PO TBCR
20.0000 meq | EXTENDED_RELEASE_TABLET | Freq: Every day | ORAL | Status: DC
  Administered 2015-01-07 – 2015-01-14 (×8): 20 meq via ORAL
  Filled 2015-01-06 (×9): qty 1

## 2015-01-06 MED ORDER — BACLOFEN 10 MG PO TABS
10.0000 mg | ORAL_TABLET | Freq: Two times a day (BID) | ORAL | Status: DC
  Administered 2015-01-07 – 2015-01-14 (×16): 10 mg via ORAL
  Filled 2015-01-06 (×20): qty 1

## 2015-01-06 MED ORDER — PREGABALIN 100 MG PO CAPS
200.0000 mg | ORAL_CAPSULE | Freq: Every day | ORAL | Status: DC
  Administered 2015-01-07 – 2015-01-14 (×7): 200 mg via ORAL
  Filled 2015-01-06 (×5): qty 2
  Filled 2015-01-06: qty 4
  Filled 2015-01-06: qty 2

## 2015-01-06 MED ORDER — VANCOMYCIN HCL IN DEXTROSE 1-5 GM/200ML-% IV SOLN
1000.0000 mg | Freq: Once | INTRAVENOUS | Status: AC
  Administered 2015-01-06: 1000 mg via INTRAVENOUS
  Filled 2015-01-06: qty 200

## 2015-01-06 MED ORDER — SODIUM CHLORIDE 0.9 % IV SOLN
INTRAVENOUS | Status: DC
  Administered 2015-01-06 (×2): via INTRAVENOUS

## 2015-01-06 MED ORDER — PANTOPRAZOLE SODIUM 40 MG PO TBEC
40.0000 mg | DELAYED_RELEASE_TABLET | Freq: Every day | ORAL | Status: DC
  Administered 2015-01-07 – 2015-01-14 (×8): 40 mg via ORAL
  Filled 2015-01-06 (×6): qty 1

## 2015-01-06 MED ORDER — DEXTROSE-NACL 5-0.45 % IV SOLN
INTRAVENOUS | Status: DC
  Administered 2015-01-07: 01:00:00 via INTRAVENOUS

## 2015-01-06 MED ORDER — LORATADINE 10 MG PO TABS
10.0000 mg | ORAL_TABLET | Freq: Every day | ORAL | Status: DC
  Administered 2015-01-07 – 2015-01-14 (×8): 10 mg via ORAL
  Filled 2015-01-06 (×9): qty 1

## 2015-01-06 MED ORDER — SODIUM CHLORIDE 0.9 % IV SOLN
500.0000 mg | Freq: Two times a day (BID) | INTRAVENOUS | Status: DC
  Administered 2015-01-07 – 2015-01-11 (×9): 500 mg via INTRAVENOUS
  Filled 2015-01-06 (×10): qty 500

## 2015-01-06 MED ORDER — SODIUM CHLORIDE 0.9 % IV SOLN
INTRAVENOUS | Status: DC
  Administered 2015-01-06: 3.7 [IU]/h via INTRAVENOUS
  Filled 2015-01-06: qty 2.5

## 2015-01-06 MED ORDER — SODIUM CHLORIDE 0.9 % IV SOLN
INTRAVENOUS | Status: AC
  Administered 2015-01-06: 18:00:00 via INTRAVENOUS

## 2015-01-06 MED ORDER — QUETIAPINE FUMARATE 25 MG PO TABS
25.0000 mg | ORAL_TABLET | Freq: Three times a day (TID) | ORAL | Status: DC
  Administered 2015-01-07 – 2015-01-14 (×23): 25 mg via ORAL
  Filled 2015-01-06 (×27): qty 1

## 2015-01-06 MED ORDER — SODIUM CHLORIDE 0.9 % IJ SOLN
10.0000 mL | Freq: Two times a day (BID) | INTRAMUSCULAR | Status: DC
  Administered 2015-01-06: 10 mL

## 2015-01-06 MED ORDER — FENTANYL 25 MCG/HR TD PT72
25.0000 ug | MEDICATED_PATCH | TRANSDERMAL | Status: DC
Start: 2015-01-06 — End: 2015-01-07

## 2015-01-06 MED ORDER — NALOXONE HCL 0.4 MG/ML IJ SOLN
0.4000 mg | Freq: Once | INTRAMUSCULAR | Status: AC
  Administered 2015-01-06: 0.4 mg via INTRAVENOUS
  Filled 2015-01-06: qty 1

## 2015-01-06 MED ORDER — SODIUM CHLORIDE 0.9 % IV BOLUS (SEPSIS)
1000.0000 mL | Freq: Once | INTRAVENOUS | Status: AC
  Administered 2015-01-06: 1000 mL via INTRAVENOUS

## 2015-01-06 MED ORDER — ALTEPLASE 2 MG IJ SOLR
2.0000 mg | Freq: Once | INTRAMUSCULAR | Status: AC
Start: 2015-01-06 — End: 2015-01-06
  Administered 2015-01-06: 2 mg
  Filled 2015-01-06: qty 2

## 2015-01-06 MED ORDER — BACLOFEN 20 MG PO TABS
20.0000 mg | ORAL_TABLET | Freq: Every day | ORAL | Status: DC
  Administered 2015-01-07 – 2015-01-14 (×7): 20 mg via ORAL
  Filled 2015-01-06: qty 1
  Filled 2015-01-06: qty 2
  Filled 2015-01-06 (×4): qty 1
  Filled 2015-01-06: qty 2
  Filled 2015-01-06 (×2): qty 1
  Filled 2015-01-06: qty 2

## 2015-01-06 MED ORDER — FLUCONAZOLE IN SODIUM CHLORIDE 200-0.9 MG/100ML-% IV SOLN
200.0000 mg | INTRAVENOUS | Status: DC
  Administered 2015-01-07 – 2015-01-14 (×8): 200 mg via INTRAVENOUS
  Filled 2015-01-06 (×9): qty 100

## 2015-01-06 MED ORDER — VANCOMYCIN HCL IN DEXTROSE 1-5 GM/200ML-% IV SOLN: 1000.0000 mg | INTRAVENOUS | Status: DC

## 2015-01-06 MED ORDER — ATORVASTATIN CALCIUM 20 MG PO TABS
20.0000 mg | ORAL_TABLET | Freq: Every day | ORAL | Status: DC
  Filled 2015-01-06 (×2): qty 1

## 2015-01-06 MED ORDER — POTASSIUM CHLORIDE 10 MEQ/100ML IV SOLN: 10.0000 meq | INTRAVENOUS | Status: DC

## 2015-01-06 MED ORDER — PREGABALIN 100 MG PO CAPS
100.0000 mg | ORAL_CAPSULE | Freq: Three times a day (TID) | ORAL | Status: DC
  Administered 2015-01-07 – 2015-01-14 (×23): 100 mg via ORAL
  Filled 2015-01-06 (×6): qty 1
  Filled 2015-01-06: qty 2
  Filled 2015-01-06 (×15): qty 1

## 2015-01-06 MED ORDER — SODIUM CHLORIDE 0.9 % IJ SOLN
10.0000 mL | INTRAMUSCULAR | Status: DC | PRN
  Administered 2015-01-06 – 2015-01-14 (×3): 10 mL
  Filled 2015-01-06 (×3): qty 40

## 2015-01-06 MED ORDER — AZTREONAM 1 G IJ SOLR
1.0000 g | Freq: Three times a day (TID) | INTRAMUSCULAR | Status: DC
  Administered 2015-01-06 – 2015-01-12 (×19): 1 g via INTRAVENOUS
  Filled 2015-01-06 (×23): qty 1

## 2015-01-06 MED ORDER — ALBUTEROL SULFATE (2.5 MG/3ML) 0.083% IN NEBU: 2.5000 mg | INHALATION_SOLUTION | RESPIRATORY_TRACT | Status: DC | PRN

## 2015-01-06 NOTE — ED Notes (Signed)
Pt awake, moaning. Withdrawing to pain.

## 2015-01-06 NOTE — ED Notes (Signed)
IV team at bedside assessing PICC line and changing dressing. Pt is moaning, communicating with mother.

## 2015-01-06 NOTE — H&P (Signed)
[facility] History and Physical  Jason [name]:[id number] DOB: 1986/06/02 DOA: 01/06/2015  Referring physician: Mingo Amber PCP: [name], MD   Chief Complaint: Altered mental status  HPI: Jason [name] is a 29 y.o. male with past medical history of type 1 DM, history of spinal infarct, he is being quadriplegic since, history of diabetic neuropathy. Patient brought to the hospital by EMS because of lethargy and altered mental status. Patient was recently discharged from the hospital on 12/01/2014 after treated for severe sepsis, aspiration pneumonia and multiple sacral decubitus ulcers with aztreonam, vancomycin and Flagyl. IV antibiotics were finished on Sunday, according to his mother since Sunday he is been getting progressively worse, not being himself, he developed fever of 101.5, lethargic and not eating. Earlier today the current arouse and so they brought him to the ED for further evaluation. In the ED patient received Narcan without significant change, his mother reported that he did not get any extra pain medications, he was hypothermic with temperature of 96.5, his WBC is 18.8, patient admitted to stepdown for further evaluation.  Review of Systems:  Unable to get review of systems secondary to lethargy.  Past Medical History  Diagnosis Date  . GERD (gastroesophageal reflux disease)   . Asthma   . Hx MRSA infection     on face  . Gastroparesis   . Diabetic neuropathy   . Seizures   . Family history of anesthesia complication     Pt mother can't have epidural procedures  . Dysrhythmia   . Pneumonia   . Arthritis   . Fibromyalgia   . 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 Type I diabetes mellitus     sees Dr. Loanne Drilling    Past Surgical History  Procedure Laterality Date  . Tonsillectomy    . Multiple extractions with alveoloplasty N/A 08/03/2014    Procedure: MULTIPLE EXTRACTIONS;  Surgeon: Gae Bon, DDS;  Location: [place];   Service: Oral Surgery;  Laterality: N/A;  . Tee without cardioversion N/A 08/17/2014    Procedure: TRANSESOPHAGEAL ECHOCARDIOGRAM (TEE);  Surgeon: Dorothy Spark, MD;  Location: [facility] ENDOSCOPY;  Service: Cardiovascular;  Laterality: N/A;  . Debridment of decubitus ulcer N/A 10/04/2014    Procedure: DEBRIDMENT OF DECUBITUS ULCER;  Surgeon: Georganna Skeans, MD;  Location: [place];  Service: General;  Laterality: N/A;  . Laparoscopic diverted colostomy N/A 10/12/2014    Procedure: LAPAROSCOPIC DIVERTING COLOSTOMY;  Surgeon: Donnie Mesa, MD;  Location: [place];  Service: General;  Laterality: N/A;  . Insertion of suprapubic catheter N/A 10/12/2014    Procedure: INSERTION OF SUPRAPUBIC CATHETER;  Surgeon: Reece Packer, MD;  Location: [place];  Service: Urology;  Laterality: N/A;  . Incision and drainage of wound N/A 11/12/2014    Procedure: IRRIGATION AND DEBRIDEMENT OF WOUNDS WITH BONE BIOPSY AND SURGICAL PREP ;  Surgeon: Theodoro Kos, DO;  Location: [place];  Service: Plastics;  Laterality: N/A;  . Application of a-cell of back N/A 11/12/2014    Procedure: APPLICATION A CELL AND VAC ;  Surgeon: Theodoro Kos, DO;  Location: [place];  Service: Plastics;  Laterality: N/A;  . Incision and drainage of wound N/A 11/18/2014    Procedure: IRRIGATION AND DEBRIDEMENT OF SACRAL ULCER ONLY WITH PLACEMENT OF A CELL AND VAC/ DRESSING CHANGE TO UPPER BACK AREA.;  Surgeon: Theodoro Kos, DO;  Location: [place];  Service: Plastics;  Laterality: N/A;  . Incision and drainage of wound N/A 11/25/2014  Procedure: IRRIGATION AND DEBRIDEMENT OF SACRAL ULCER AND BACK BURN WITH PLACEMENT OF A-CELL;  Surgeon: Theodoro Kos, DO;  Location: [place];  Service: Plastics;  Laterality: N/A;  . Minor application of wound vac N/A 11/25/2014    Procedure:  WOUND VAC CHANGE;  Surgeon: Theodoro Kos, DO;  Location: [place];  Service: Plastics;  Laterality: N/A;  . Incision and drainage of wound Right 12/08/2014    Procedure: IRRIGATION AND DEBRIDEMENT  SACRAL WOUND AND RIGHT ISCHIAL WOUND ;  Surgeon: Theodoro Kos, DO;  Location: [place];  Service: Plastics;  Laterality: Right;  . Application of a-cell of back N/A 12/08/2014    Procedure: APPLICATION OF A-CELL AND WOUND VAC ;  Surgeon: Theodoro Kos, DO;  Location: [place];  Service: Plastics;  Laterality: N/A;   Social History:   reports that he quit smoking about 3 months ago. His smoking use included Cigars and Cigarettes. He quit after 12 years of use. He has never used smokeless tobacco. He reports that he does not drink alcohol or use illicit drugs.  Allergies  Allergen Reactions  . Cefuroxime Axetil Anaphylaxis  . Ertapenem Other (See Comments)    Rash and confusion  . Morphine And Related Other (See Comments)    Changed mental status, confusion, headache, visual hallucination  . Penicillins Anaphylaxis and Other (See Comments)    ?can take amoxicillin?  . Sulfa Antibiotics Anaphylaxis, Shortness Of Breath and Other (See Comments)  . Tessalon [Benzonatate] Anaphylaxis  . Shellfish Allergy Itching and Other (See Comments)    Took benadryl to alleviate reaction    Family History  Problem Relation Age of Onset  . Diabetes Father   . Hypertension Father   . Asthma      fhx  . Hypertension      fhx  . Stroke      fhx  . Heart disease Mother      Prior to Admission medications   Medication Sig Start Date End Date Taking? Authorizing Provider  acetaminophen (TYLENOL) 500 MG tablet Take 1,000 mg by mouth every 6 (six) hours as needed for headache.   Yes Historical Provider, MD  albuterol (PROVENTIL) (2.5 MG/3ML) 0.083% nebulizer solution Take 3 mLs (2.5 mg total) by nebulization every 4 (four) hours as needed for wheezing or shortness of breath. 04/27/14  Yes [name], MD  atorvastatin (LIPITOR) 20 MG tablet Take 20 mg by mouth daily.   Yes Historical Provider, MD  aztreonam 1 g in sodium chloride 0.9 % 50 mL Inject 1 g into the vein every 8 (eight) hours. 12/24/14  Yes Verlee Monte, MD  baclofen (LIORESAL) 10 MG tablet Take 1-2 tablets (10-20 mg total) by mouth 3 (three) times daily. Takes 10mg  in morning and lunchtime and takes 20mg  at bedtime 11/29/14  Yes Meredith Staggers, MD  dexlansoprazole (DEXILANT) 60 MG capsule Take 1 capsule (60 mg total) by mouth daily. 11/19/14  Yes Shanker Kristeen Mans, MD  diclofenac sodium (VOLTAREN) 1 % GEL Apply 2 g topically daily as needed (for pain). 11/19/14  Yes Shanker Kristeen Mans, MD  diphenoxylate-atropine (LOMOTIL) 2.5-0.025 MG per tablet Take 1 tablet by mouth 4 (four) times daily as needed for diarrhea or loose stools.   Yes Historical Provider, MD  feeding supplement, GLUCERNA SHAKE, (GLUCERNA SHAKE) LIQD Take 237 mLs by mouth 3 (three) times daily between meals. Patient taking differently: Take 237 mLs by mouth 3 (three) times daily with meals.  11/19/14  Yes Shanker Kristeen Mans, MD  feeding supplement, GLUCERNA SHAKE, (GLUCERNA SHAKE) LIQD Take 237 mLs by mouth 3 (three) times daily between meals. 12/10/14  Yes Florencia Reasons, MD  fentaNYL (DURAGESIC - DOSED MCG/HR) 25 MCG/HR patch Place 1 patch (25 mcg total) onto the skin every 3 (three) days. 11/29/14  Yes Meredith Staggers, MD  glucagon (GLUCAGON EMERGENCY) 1 MG injection Inject 1 mg into the vein once as needed. Patient taking differently: Inject 1 mg into the vein once as needed (hypoglycemia).  04/20/14  Yes [name], MD  insulin aspart (NOVOLOG) 100 UNIT/ML injection Inject 4 Units into the skin 3 (three) times daily before meals. Patient taking differently: Inject 5 Units into the skin 4 (four) times daily.  11/19/14  Yes Shanker Kristeen Mans, MD  insulin glargine (LANTUS) 100 UNIT/ML injection Inject 0.28 mLs (28 Units total) into the skin at bedtime. 12/24/14  Yes Verlee Monte, MD  lidocaine (XYLOCAINE) 2 % jelly Apply 1 application topically 3 (three) times a week.   Yes Historical Provider, MD  lidocaine (XYLOCAINE) 5 % ointment Apply 1 application topically as needed (mouth sores).    Yes Historical Provider, MD  loratadine (CLARITIN) 10 MG tablet Take 10 mg by mouth daily.    Yes Historical Provider, MD  metroNIDAZOLE (FLAGYL) 500 MG tablet Take 1 tablet (500 mg total) by mouth 3 (three) times daily. 12/24/14  Yes Verlee Monte, MD  midodrine (PROAMATINE) 5 MG tablet Take 5 mg by mouth as needed (Systolic blood pressure <46).   Yes Historical Provider, MD  ondansetron (ZOFRAN ODT) 8 MG disintegrating tablet Take 1 tablet (8 mg total) by mouth every 8 (eight) hours as needed for nausea or vomiting. 12/29/14  Yes [name], MD  oxyCODONE-acetaminophen (PERCOCET) 10-325 MG per tablet Take 1 tablet by mouth every 8 (eight) hours. 11/29/14  Yes Meredith Staggers, MD  potassium chloride SA (KLOR-CON M20) 20 MEQ tablet Take 1 tablet (20 mEq total) by mouth daily. 10/25/14  Yes [name], MD  pregabalin (LYRICA) 100 MG capsule Take 1-2 capsules (100-200 mg total) by mouth 4 (four) times daily. Take 100 mg by mouth in the morning, take 100 mg by mouth in the afternoon, take 100 mg by mouth in the evening and take 200 mg by mouth at bedtime. 11/29/14  Yes Meredith Staggers, MD  QUEtiapine (SEROQUEL) 25 MG tablet Take 1 tablet (25 mg total) by mouth 3 (three) times daily. 12/10/14  Yes Florencia Reasons, MD  sodium chloride (OCEAN) 0.65 % SOLN nasal spray Place 1 spray into both nostrils as needed for congestion.   Yes Historical Provider, MD  vancomycin (VANCOCIN) 1 GM/200ML SOLN Inject 250 mLs (1,250 mg total) into the vein daily. 12/24/14  Yes Verlee Monte, MD  collagenase (SANTYL) ointment Apply 1 application topically daily. 08/30/14   [name], MD  liver oil-zinc oxide (DESITIN) 40 % ointment Apply topically 2 (two) times daily. 12/10/14   Florencia Reasons, MD  pregabalin (LYRICA) 200 MG capsule Take 1 capsule (200 mg total) by mouth at bedtime. 12/10/14   Florencia Reasons, MD   Physical Exam: Filed Vitals:   01/06/15 1345  BP: 97/79  Pulse:   Temp:   Resp: 8   Constitutional: Lethargic, responded to very  painful stimuli, talked for 1 or 2 minutes then went back to sleep Head: Normocephalic and atraumatic.  Nose: Nose normal.  Mouth/Throat: Uvula is midline, oropharynx is clear and moist and mucous membranes are normal.  Eyes: Conjunctivae and EOM  are normal. Pupils are equal, round, and reactive to light.  Neck: Trachea normal and normal range of motion. Neck supple.  Cardiovascular: Normal rate, regular rhythm, S1 normal, S2 normal, normal heart sounds and intact distal pulses.   Pulmonary/Chest: Effort normal and breath sounds normal.  Abdominal: Soft. Bowel sounds are normal. There is no hepatosplenomegaly. There is no tenderness.  Musculoskeletal: Lower extremity thin, muscle wasting  Neurological: Lethargic Skin: Skin is warm, dry and intact.  Psychiatric: Has a normal mood and affect. Speech is normal and behavior is normal.   Labs on Admission:  Basic Metabolic Panel:  Recent Labs Lab 01/06/15 1015  NA 130*  K 4.6  CL 104  CO2 17*  GLUCOSE 579*  BUN 23  CREATININE 1.73*  CALCIUM 9.9   Liver Function Tests:  Recent Labs Lab 01/06/15 1015  AST 8  ALT 10  ALKPHOS 93  BILITOT 0.5  PROT 7.3  ALBUMIN 2.2*   No results for input(s): LIPASE, AMYLASE in the last 168 hours.  Recent Labs Lab 01/06/15 1015  AMMONIA 39*   CBC:  Recent Labs Lab 01/06/15 1015  WBC 18.8*  HGB 8.1*  HCT 26.8*  MCV 86.5  PLT 608*   Cardiac Enzymes: No results for input(s): CKTOTAL, CKMB, CKMBINDEX, TROPONINI in the last 168 hours.  BNP (last 3 results) No results for input(s): BNP in the last 8760 hours.  ProBNP (last 3 results)  Recent Labs  01/29/14 2113  PROBNP 1343.0*    CBG: No results for input(s): GLUCAP in the last 168 hours.  Radiological Exams on Admission: Ct Head Wo Contrast  01/06/2015   CLINICAL DATA:  Lethargy, history of quadriplegia  EXAM: CT HEAD WITHOUT CONTRAST  TECHNIQUE: Contiguous axial images were obtained from the base of the skull through  the vertex without intravenous contrast.  COMPARISON:  12/01/2014  FINDINGS: No skull fracture is noted. Paranasal sinuses and mastoid air cells are unremarkable. No intracranial hemorrhage, mass effect or midline shift. No acute infarction. No mass lesion is noted on this unenhanced scan. Ventricular size is stable from prior exam.  IMPRESSION: No acute intracranial abnormality.  No significant change.   Electronically Signed   By: Lahoma Crocker M.D.   On: 01/06/2015 11:47   Dg Chest Port 1 View  01/06/2015   CLINICAL DATA:  29 year old male with altered mental status. PICC line position evaluation. Initial encounter.  EXAM: PORTABLE CHEST - 1 VIEW  COMPARISON:  12/18/2014 and earlier.  FINDINGS: Portable AP semi upright view at 0956 hours. Right upper extremity PICC line re- identified. Tip is at the level of the carina.  Extubated. Enteric tube removed. Stable to mildly improved lung volumes. Decreased but not resolved bilateral patchy infrahilar opacity. No pneumothorax or pulmonary edema. No pleural effusion. Increased gaseous distension of the stomach.  IMPRESSION: 1. Right PICC line tip at the SVC level. 2. Extubated and enteric tube removed. 3. Regressed but not resolved bibasilar pulmonary opacity.   Electronically Signed   By: Genevie Ann M.D.   On: 01/06/2015 10:20    EKG: Independently reviewed.   Assessment/Plan Principal Problem:   Sepsis Active Problems:   IDDM (insulin dependent diabetes mellitus)   Spinal cord infarction (history of)   Acute encephalopathy   UTI (lower urinary tract infection)   Stage IV decubitus ulcer   AKI (acute kidney injury)   Suprapubic catheter   DKA (diabetic ketoacidoses)    Sepsis Temperature of 96.5, WBCs of 18,000 and suspected infection. Started  on broad-spectrum antibiotics and aggressive IV fluid hydration. Patient has UTI, mother reports worsening of sacral decubitus ulcer. Blood culture obtained, urine culture, PRS to see the wound.  DKA Mild  DKA, patient presented with bicarbonate of 16 and glucose of 579. Patient started on DKA protocol with IV insulin and IV fluids. Check EMP every 4 hours for at least 24 hours. Follow electrolytes closely.  UTI Suprapubic indwelling catheter-related UTI, also showing yeast. Patient started on vancomycin and Zosyn for the sepsis which going to cover UTI. I will add fluconazole.  Acute encephalopathy Likely secondary to sepsis, initially narcotic overdose suspected, Narcan did not reverse his lethargy. His DKA is mild I don't think that was causing his encephalopathy I think this is secondary to sepsis.  History of a spinal cord infarction With quadriplegia, continue aspirin.  Acute kidney injury Baseline creatinine is 0.7, creatinine today is 1.73, started on aggressive IV fluid hydration. Check BMP in a.m.  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 IV antibiotics was just finished on 01/02/2015. PRS to evaluate the patient  Code Status: Full code Family Communication: Plan discussed with the patient's mother at bedside. Disposition Plan: Stepdown  Time spent: 70 minutes  Onda Kattner A, MD [facility] Pager [phone]

## 2015-01-06 NOTE — Progress Notes (Addendum)
ANTIBIOTIC CONSULT NOTE - INITIAL  Pharmacy Consult for aztreonam Indication: UTI  Allergies  Allergen Reactions  . Cefuroxime Axetil Anaphylaxis  . Ertapenem Other (See Comments)    Rash and confusion  . Morphine And Related Other (See Comments)    Changed mental status, confusion, headache, visual hallucination  . Penicillins Anaphylaxis and Other (See Comments)    ?can take amoxicillin?  . Sulfa Antibiotics Anaphylaxis, Shortness Of Breath and Other (See Comments)  . Tessalon [Benzonatate] Anaphylaxis  . Shellfish Allergy Itching and Other (See Comments)    Took benadryl to alleviate reaction    Patient Measurements:    Weight: 59 kg  Vital Signs: Temp: 97.5 F (36.4 C) (03/31 1001) Temp Source: Oral (03/31 1001) BP: 99/74 mmHg (03/31 1215) Pulse Rate: 71 (03/31 1215) Intake/Output from previous day:   Intake/Output from this shift:    Labs:  Recent Labs  01/06/15 1015  WBC 18.8*  HGB 8.1*  PLT 608*  CREATININE 1.73*   CrCl cannot be calculated (Unknown ideal weight.). No results for input(s): VANCOTROUGH, VANCOPEAK, VANCORANDOM, GENTTROUGH, GENTPEAK, GENTRANDOM, TOBRATROUGH, TOBRAPEAK, TOBRARND, AMIKACINPEAK, AMIKACINTROU, AMIKACIN in the last 72 hours.   Microbiology: Recent Results (from the past 720 hour(s))  Surgical pcr screen     Status: None   Collection Time: 12/08/14  8:39 AM  Result Value Ref Range Status   MRSA, PCR NEGATIVE NEGATIVE Final   Staphylococcus aureus NEGATIVE NEGATIVE Final    Comment:        The Xpert SA Assay (FDA approved for NASAL specimens in patients over 67 years of age), is one component of a comprehensive surveillance program.  Test performance has been validated by [facility] for patients greater than or equal to 37 year old. It is not intended to diagnose infection nor to guide or monitor treatment.   Culture, blood (routine x 2)     Status: None   Collection Time: 12/10/14  4:55 PM  Result Value Ref Range  Status   Specimen Description BLOOD  Final   Special Requests NONE  Final   Culture   Final    NO GROWTH 5 DAYS Performed at [facility]    Report Status 12/17/2014 FINAL  Final  Culture, blood (routine x 2)     Status: None   Collection Time: 12/10/14  5:00 PM  Result Value Ref Range Status   Specimen Description BLOOD LEFT ARM  Final   Special Requests BOTTLES DRAWN AEROBIC ONLY 1 CC  Final   Culture   Final    NO GROWTH 5 DAYS Performed at [facility]    Report Status 12/17/2014 FINAL  Final  Urine culture     Status: None   Collection Time: 12/10/14  6:11 PM  Result Value Ref Range Status   Specimen Description URINE, CATHETERIZED  Final   Special Requests NONE  Final   Colony Count NO GROWTH Performed at [facility]   Final   Culture NO GROWTH Performed at [facility]   Final   Report Status 12/12/2014 FINAL  Final  Clostridium Difficile by PCR     Status: None   Collection Time: 12/12/14  4:22 AM  Result Value Ref Range Status   C difficile by pcr NEGATIVE NEGATIVE Final  MRSA PCR Screening     Status: None   Collection Time: 12/12/14  3:27 PM  Result Value Ref Range Status   MRSA by PCR NEGATIVE NEGATIVE Final    Comment:  The GeneXpert MRSA Assay (FDA approved for NASAL specimens only), is one component of a comprehensive MRSA colonization surveillance program. It is not intended to diagnose MRSA infection nor to guide or monitor treatment for MRSA infections.     Medical History: Past Medical History  Diagnosis Date  . GERD (gastroesophageal reflux disease)   . Asthma   . Hx MRSA infection     on face  . Gastroparesis   . Diabetic neuropathy   . Seizures   . Family history of anesthesia complication     Pt mother can't have epidural procedures  . Dysrhythmia   . Pneumonia   . Arthritis   . Fibromyalgia   . 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 Type I diabetes mellitus      sees Dr. 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  Pharmacy consulted to dose aztreonam for UTI.  Has PICC line an on vanc/flagyl/aztreonam at home.  Has colostomy, suprapubic catheter.  Wt 59 kg, WBC 18.8, creat up to 1.73 (was 0.7 on 3/18).  Creat cl ~ 44 ml/min.   Plan:  -aztreonam 1  Gm IV q8h - f/u renal fxn, wbc, temp, culture data  Eudelia Bunch, Pharm.D. [phone] 01/06/2015 12:31 PM  Addendum: Pharmacy consulted to add Vancomycin and Fluconazole for sepsis likely from a UTI.   Cultures: 3/31 Urine Cx>> 3/31 Blood Cx x2>>  Plan:  -Given one-time Vancomycin dose of 1 gm IV. Enter maintenance vanc dose once patient weight is entered -Fluconazole 200 mg IV Q 24 hours -Monitor CBC, renal fx, cultures and clinical progress  Albertina Parr, PharmD., BCPS Clinical Pharmacist Pager [phone]  -Start Vancomycin 500 mg IV Q 12 hours with CrCl ~ 52 mL/min.   Lucio Edward, PharmD

## 2015-01-06 NOTE — ED Provider Notes (Signed)
CSN: [id number]     Arrival date & time 01/06/15  6712 History   First MD Initiated Contact with Patient 01/06/15 [phone]     Chief Complaint  Patient presents with  . Altered Mental Status     (Consider location/radiation/quality/duration/timing/severity/associated sxs/prior Treatment) Patient is a 29 y.o. male presenting with altered mental status. The history is provided by the EMS personnel.  Altered Mental Status Presenting symptoms: unresponsiveness   Severity:  Moderate Most recent episode:  Today Episode history:  Single Timing:  Constant Progression:  Unchanged Chronicity:  Recurrent Context: recent illness and recent infection     Past Medical History  Diagnosis Date  . GERD (gastroesophageal reflux disease)   . Asthma   . Hx MRSA infection     on face  . Gastroparesis   . Diabetic neuropathy   . Seizures   . Family history of anesthesia complication     Pt mother can't have epidural procedures  . Dysrhythmia   . Pneumonia   . Arthritis   . Fibromyalgia   . 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 Type I diabetes mellitus     sees Dr. Loanne Drilling    Past Surgical History  Procedure Laterality Date  . Tonsillectomy    . Multiple extractions with alveoloplasty N/A 08/03/2014    Procedure: MULTIPLE EXTRACTIONS;  Surgeon: Gae Bon, DDS;  Location: [place];  Service: Oral Surgery;  Laterality: N/A;  . Tee without cardioversion N/A 08/17/2014    Procedure: TRANSESOPHAGEAL ECHOCARDIOGRAM (TEE);  Surgeon: Dorothy Spark, MD;  Location: [facility] END[facility]COPY;  Service: Cardiovascular;  Laterality: N/A;  . Debridment of decubitus ulcer N/A 10/04/2014    Procedure: DEBRIDMENT OF DECUBITUS ULCER;  Surgeon: Georganna Skeans, MD;  Location: [place];  Service: General;  Laterality: N/A;  . Laparoscopic diverted colostomy N/A 10/12/2014    Procedure: LAPAROSCOPIC DIVERTING COLOSTOMY;  Surgeon: Donnie Mesa, MD;  Location: [place];  Service: General;  Laterality: N/A;   . Insertion of suprapubic catheter N/A 10/12/2014    Procedure: INSERTION OF SUPRAPUBIC CATHETER;  Surgeon: Reece Packer, MD;  Location: [place];  Service: Urology;  Laterality: N/A;  . Incision and drainage of wound N/A 11/12/2014    Procedure: IRRIGATION AND DEBRIDEMENT OF WOUNDS WITH BONE BIOPSY AND SURGICAL PREP ;  Surgeon: Theodoro Kos, DO;  Location: [place];  Service: Plastics;  Laterality: N/A;  . Application of a-cell of back N/A 11/12/2014    Procedure: APPLICATION A CELL AND VAC ;  Surgeon: Theodoro Kos, DO;  Location: [place];  Service: Plastics;  Laterality: N/A;  . Incision and drainage of wound N/A 11/18/2014    Procedure: IRRIGATION AND DEBRIDEMENT OF SACRAL ULCER ONLY WITH PLACEMENT OF A CELL AND VAC/ DRESSING CHANGE TO UPPER BACK AREA.;  Surgeon: Theodoro Kos, DO;  Location: [place];  Service: Plastics;  Laterality: N/A;  . Incision and drainage of wound N/A 11/25/2014    Procedure: IRRIGATION AND DEBRIDEMENT OF SACRAL ULCER AND BACK BURN WITH PLACEMENT OF A-CELL;  Surgeon: Theodoro Kos, DO;  Location: [place];  Service: Plastics;  Laterality: N/A;  . Minor application of wound vac N/A 11/25/2014    Procedure:  WOUND VAC CHANGE;  Surgeon: Theodoro Kos, DO;  Location: [place];  Service: Plastics;  Laterality: N/A;  . Incision and drainage of wound Right 12/08/2014    Procedure: IRRIGATION AND DEBRIDEMENT SACRAL WOUND AND RIGHT ISCHIAL WOUND ;  Surgeon: Theodoro Kos, DO;  Location: [facility]  OR;  Service: Clinical cytogeneticist;  Laterality: Right;  . Application of a-cell of back N/A 12/08/2014    Procedure: APPLICATION OF A-CELL AND WOUND VAC ;  Surgeon: Theodoro Kos, DO;  Location: [place];  Service: Plastics;  Laterality: N/A;   Family History  Problem Relation Age of Onset  . Diabetes Father   . Hypertension Father   . Asthma      fhx  . Hypertension      fhx  . Stroke      fhx  . Heart disease Mother    History  Substance Use Topics  . Smoking status: Former Smoker -- 12 years    Types: Cigars,  Cigarettes    Quit date: 09/07/2014  . Smokeless tobacco: Never Used     Comment: e-cigarette and cigars currently  . Alcohol Use: No     Comment: rarely    Review of Systems  Unable to perform ROS: Mental status change      Allergies  Cefuroxime axetil; Ertapenem; Morphine and related; Penicillins; Sulfa antibiotics; Tessalon; and Shellfish allergy  Home Medications   Prior to Admission medications   Medication Sig Start Date End Date Taking? Authorizing Provider  acetaminophen (TYLENOL) 500 MG tablet Take 1,000 mg by mouth every 6 (six) hours as needed for headache.   Yes Historical Provider, MD  albuterol (PROVENTIL) (2.5 MG/3ML) 0.083% nebulizer solution Take 3 mLs (2.5 mg total) by nebulization every 4 (four) hours as needed for wheezing or shortness of breath. 04/27/14  Yes [name], MD  atorvastatin (LIPITOR) 20 MG tablet Take 20 mg by mouth daily.   Yes Historical Provider, MD  aztreonam 1 g in sodium chloride 0.9 % 50 mL Inject 1 g into the vein every 8 (eight) hours. 12/24/14  Yes Verlee Monte, MD  baclofen (LIORESAL) 10 MG tablet Take 1-2 tablets (10-20 mg total) by mouth 3 (three) times daily. Takes 10mg  in morning and lunchtime and takes 20mg  at bedtime 11/29/14  Yes Meredith Staggers, MD  dexlansoprazole (DEXILANT) 60 MG capsule Take 1 capsule (60 mg total) by mouth daily. 11/19/14  Yes Shanker Kristeen Mans, MD  diclofenac sodium (VOLTAREN) 1 % GEL Apply 2 g topically daily as needed (for pain). 11/19/14  Yes Shanker Kristeen Mans, MD  diphenoxylate-atropine (LOMOTIL) 2.5-0.025 MG per tablet Take 1 tablet by mouth 4 (four) times daily as needed for diarrhea or loose stools.   Yes Historical Provider, MD  feeding supplement, GLUCERNA SHAKE, (GLUCERNA SHAKE) LIQD Take 237 mLs by mouth 3 (three) times daily between meals. Patient taking differently: Take 237 mLs by mouth 3 (three) times daily with meals.  11/19/14  Yes Shanker Kristeen Mans, MD  feeding supplement, GLUCERNA SHAKE,  (GLUCERNA SHAKE) LIQD Take 237 mLs by mouth 3 (three) times daily between meals. 12/10/14  Yes Florencia Reasons, MD  fentaNYL (DURAGESIC - DOSED MCG/HR) 25 MCG/HR patch Place 1 patch (25 mcg total) onto the skin every 3 (three) days. 11/29/14  Yes Meredith Staggers, MD  glucagon (GLUCAGON EMERGENCY) 1 MG injection Inject 1 mg into the vein once as needed. Patient taking differently: Inject 1 mg into the vein once as needed (hypoglycemia).  04/20/14  Yes [name], MD  insulin aspart (NOVOLOG) 100 UNIT/ML injection Inject 4 Units into the skin 3 (three) times daily before meals. Patient taking differently: Inject 5 Units into the skin 4 (four) times daily.  11/19/14  Yes Shanker Kristeen Mans, MD  insulin glargine (LANTUS) 100 UNIT/ML injection Inject  0.28 mLs (28 Units total) into the skin at bedtime. 12/24/14  Yes Verlee Monte, MD  lidocaine (XYLOCAINE) 2 % jelly Apply 1 application topically 3 (three) times a week.   Yes Historical Provider, MD  lidocaine (XYLOCAINE) 5 % ointment Apply 1 application topically as needed (mouth sores).   Yes Historical Provider, MD  loratadine (CLARITIN) 10 MG tablet Take 10 mg by mouth daily.    Yes Historical Provider, MD  metroNIDAZOLE (FLAGYL) 500 MG tablet Take 1 tablet (500 mg total) by mouth 3 (three) times daily. 12/24/14  Yes Verlee Monte, MD  midodrine (PROAMATINE) 5 MG tablet Take 5 mg by mouth as needed (Systolic blood pressure <10).   Yes Historical Provider, MD  ondansetron (ZOFRAN ODT) 8 MG disintegrating tablet Take 1 tablet (8 mg total) by mouth every 8 (eight) hours as needed for nausea or vomiting. 12/29/14  Yes [name], MD  oxyCODONE-acetaminophen (PERCOCET) 10-325 MG per tablet Take 1 tablet by mouth every 8 (eight) hours. 11/29/14  Yes Meredith Staggers, MD  potassium chloride SA (KLOR-CON M20) 20 MEQ tablet Take 1 tablet (20 mEq total) by mouth daily. 10/25/14  Yes [name], MD  pregabalin (LYRICA) 100 MG capsule Take 1-2 capsules (100-200 mg total) by  mouth 4 (four) times daily. Take 100 mg by mouth in the morning, take 100 mg by mouth in the afternoon, take 100 mg by mouth in the evening and take 200 mg by mouth at bedtime. 11/29/14  Yes Meredith Staggers, MD  QUEtiapine (SEROQUEL) 25 MG tablet Take 1 tablet (25 mg total) by mouth 3 (three) times daily. 12/10/14  Yes Florencia Reasons, MD  sodium chloride (OCEAN) 0.65 % SOLN nasal spray Place 1 spray into both nostrils as needed for congestion.   Yes Historical Provider, MD  vancomycin (VANCOCIN) 1 GM/200ML SOLN Inject 250 mLs (1,250 mg total) into the vein daily. 12/24/14  Yes Verlee Monte, MD  collagenase (SANTYL) ointment Apply 1 application topically daily. 08/30/14   [name], MD  liver oil-zinc oxide (DESITIN) 40 % ointment Apply topically 2 (two) times daily. 12/10/14   Florencia Reasons, MD  pregabalin (LYRICA) 200 MG capsule Take 1 capsule (200 mg total) by mouth at bedtime. 12/10/14   Florencia Reasons, MD   BP 98/68 mmHg  Pulse 71  Temp(Src) 97.5 F (36.4 C) (Oral)  Resp 8  SpO2 99% Physical Exam  Constitutional: He appears well-developed and well-nourished. No distress.  HENT:  Head: Normocephalic and atraumatic.  Mouth/Throat: Oropharynx is clear and moist. No oropharyngeal exudate.  Eyes: EOM are normal. Pupils are equal, round, and reactive to light.  Neck: Normal range of motion. Neck supple.  Cardiovascular: Normal rate and regular rhythm.  Exam reveals no friction rub.   No murmur heard. Pulmonary/Chest: Effort normal and breath sounds normal. Bradypnea noted. No respiratory distress. He has no wheezes. He has no rales.  Abdominal: Soft. He exhibits no distension. There is no tenderness.  Musculoskeletal: Normal range of motion. He exhibits no edema.  Neurological: GCS eye subscore is 1. GCS verbal subscore is 1. GCS motor subscore is 1.  Skin: No rash noted. He is not diaphoretic.  Nursing note and vitals reviewed.   ED Course  Procedures (including critical care time) Labs Review Labs  Reviewed  CBC - Abnormal; Notable for the following:    WBC 18.8 (*)    RBC 3.10 (*)    Hemoglobin 8.1 (*)    HCT 26.8 (*)  RDW 15.7 (*)    Platelets 608 (*)    All other components within normal limits  COMPREHENSIVE METABOLIC PANEL - Abnormal; Notable for the following:    Sodium 130 (*)    CO2 17 (*)    Glucose, Bld 579 (*)    Creatinine, Ser 1.73 (*)    Albumin 2.2 (*)    GFR calc non Af Amer 51 (*)    GFR calc Af Amer 59 (*)    All other components within normal limits  URINALYSIS, ROUTINE W REFLEX MICROSCOPIC - Abnormal; Notable for the following:    APPearance TURBID (*)    Glucose, UA >1000 (*)    Hgb urine dipstick MODERATE (*)    Protein, ur 30 (*)    Leukocytes, UA MODERATE (*)    All other components within normal limits  AMMONIA - Abnormal; Notable for the following:    Ammonia 39 (*)    All other components within normal limits  I-STAT ARTERIAL BLOOD GAS, ED - Abnormal; Notable for the following:    pH, Arterial 7.287 (*)    pCO2 arterial 31.4 (*)    pO2, Arterial 67.0 (*)    Bicarbonate 15.1 (*)    Acid-base deficit 11.0 (*)    All other components within normal limits  CLOSTRIDIUM DIFFICILE BY PCR  URINE CULTURE  CULTURE, BLOOD (ROUTINE X 2)  CULTURE, BLOOD (ROUTINE X 2)  STOOL CULTURE  URINE MICROSCOPIC-ADD ON  BLOOD GAS, ARTERIAL  I-STAT TROPOININ, ED    Imaging Review Ct Head Wo Contrast  01/06/2015   CLINICAL DATA:  Lethargy, history of quadriplegia  EXAM: CT HEAD WITHOUT CONTRAST  TECHNIQUE: Contiguous axial images were obtained from the base of the skull through the vertex without intravenous contrast.  COMPARISON:  12/01/2014  FINDINGS: No skull fracture is noted. Paranasal sinuses and mastoid air cells are unremarkable. No intracranial hemorrhage, mass effect or midline shift. No acute infarction. No mass lesion is noted on this unenhanced scan. Ventricular size is stable from prior exam.  IMPRESSION: No acute intracranial abnormality.  No  significant change.   Electronically Signed   By: Lahoma Crocker M.D.   On: 01/06/2015 11:47   Dg Chest Port 1 View  01/06/2015   CLINICAL DATA:  30 year old male with altered mental status. PICC line position evaluation. Initial encounter.  EXAM: PORTABLE CHEST - 1 VIEW  COMPARISON:  12/18/2014 and earlier.  FINDINGS: Portable AP semi upright view at 0956 hours. Right upper extremity PICC line re- identified. Tip is at the level of the carina.  Extubated. Enteric tube removed. Stable to mildly improved lung volumes. Decreased but not resolved bilateral patchy infrahilar opacity. No pneumothorax or pulmonary edema. No pleural effusion. Increased gaseous distension of the stomach.  IMPRESSION: 1. Right PICC line tip at the SVC level. 2. Extubated and enteric tube removed. 3. Regressed but not resolved bibasilar pulmonary opacity.   Electronically Signed   By: Genevie Ann M.D.   On: 01/06/2015 10:20     EKG Interpretation   Date/Time:  Thursday January 06 2015 09:53:07 EDT Ventricular Rate:  83 PR Interval:  132 QRS Duration: 72 QT Interval:  361 QTC Calculation: 424 R Axis:   73 Text Interpretation:  Sinus rhythm T wave elevation inferiorly  Anteroseptal infarct, age indeterminate T ave flattening laterally, new  Confirmed by Mingo Amber  MD, [place] ([id number]) on 01/06/2015 9:57:33 AM     CRITICAL CARE Performed by: Osvaldo Shipper   Total critical care time:  30 minutes  Critical care time was exclusive of separately billable procedures and treating other patients.  Critical care was necessary to treat or prevent imminent or life-threatening deterioration.  Critical care was time spent personally by me on the following activities: development of treatment plan with patient and/or surrogate as well as nursing, discussions with consultants, evaluation of patient's response to treatment, examination of patient, obtaining history from patient or surrogate, ordering and performing treatments and  interventions, ordering and review of laboratory studies, ordering and review of radiographic studies, pulse oximetry and re-evaluation of patient's condition.  MDM   Final diagnoses:  Urinary tract infection associated with catheterization of urinary tract, initial encounter  Altered mental status, unspecified altered mental status type  Hyperglycemia  Acidosis    29 year old male here with altered mental status. 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 Has had osteomyelitis of an issue balloon, UTIs. Has a PICC line and is currently on Vancomycin, flagyl, and aztreonam. End date on antibiotics was 4 days ago according to a discharge summary from 2 weeks ago. Here prsesures stable, bradypneic. No hypoxia. Fentanyl patch removed by EMS. After Narcan, awakening, groaning. i spoke with Mom who asked we wait on any intubation until she arrived. I do not feel patient needs emergent intubation, but will need it in the ED if his mental status does not improve. Will also obtain ABG. Initial GCS around 3, but after Narcan is doing much better. ABG is mild acidosis. Is not retaining CO2.  Labs show UTI. White count is 18.8, is chronically elevated. Bicarbonate 17, hyperglycemic at 580. Plan on admission to step down.     Evelina Bucy, MD 01/06/15 [phone]

## 2015-01-06 NOTE — ED Notes (Addendum)
Per EMS- Pt is quad comes from home family reports increasing lethargy since yesterday. Pt unresponsive, VSS. Pt has colostomy, suprapubic catheter. CBG 419. Fentanyl patch to chest removed.

## 2015-01-07 DIAGNOSIS — L89159 Pressure ulcer of sacral region, unspecified stage: Secondary | ICD-10-CM

## 2015-01-07 DIAGNOSIS — L97509 Non-pressure chronic ulcer of other part of unspecified foot with unspecified severity: Secondary | ICD-10-CM

## 2015-01-07 DIAGNOSIS — E1010 Type 1 diabetes mellitus with ketoacidosis without coma: Secondary | ICD-10-CM

## 2015-01-07 DIAGNOSIS — L97529 Non-pressure chronic ulcer of other part of left foot with unspecified severity: Secondary | ICD-10-CM

## 2015-01-07 DIAGNOSIS — L97519 Non-pressure chronic ulcer of other part of right foot with unspecified severity: Secondary | ICD-10-CM

## 2015-01-07 DIAGNOSIS — E10621 Type 1 diabetes mellitus with foot ulcer: Secondary | ICD-10-CM | POA: Diagnosis present

## 2015-01-07 DIAGNOSIS — Z881 Allergy status to other antibiotic agents status: Secondary | ICD-10-CM | POA: Diagnosis present

## 2015-01-07 LAB — BASIC METABOLIC PANEL
ANION GAP: 2 — AB (ref 5–15)
ANION GAP: 3 — AB (ref 5–15)
Anion gap: 9 (ref 5–15)
BUN: 22 mg/dL (ref 6–23)
BUN: 19 mg/dL (ref 6–23)
BUN: 16 mg/dL (ref 6–23)
CALCIUM: 9.5 mg/dL (ref 8.4–10.5)
CO2: 17 mmol/L — AB (ref 19–32)
CO2: 23 mmol/L (ref 19–32)
CO2: 20 mmol/L (ref 19–32)
CREATININE: 1.33 mg/dL (ref 0.50–1.35)
CREATININE: 1.07 mg/dL (ref 0.50–1.35)
CREATININE: 0.81 mg/dL (ref 0.50–1.35)
Calcium: 9.7 mg/dL (ref 8.4–10.5)
Calcium: 9.5 mg/dL (ref 8.4–10.5)
Chloride: 112 mmol/L (ref 96–112)
Chloride: 113 mmol/L — ABNORMAL HIGH (ref 96–112)
Chloride: 111 mmol/L (ref 96–112)
GFR calc non Af Amer: 71 mL/min — ABNORMAL LOW (ref >90)
GFR calc non Af Amer: >90 mL/min (ref >90)
GFR, EST AFRICAN AMERICAN: 82 mL/min — AB (ref >90)
GLUCOSE: 243 mg/dL — AB (ref 70–99)
Glucose, Bld: 108 mg/dL — ABNORMAL HIGH (ref 70–99)
Glucose, Bld: 223 mg/dL — ABNORMAL HIGH (ref 70–99)
POTASSIUM: 3.4 mmol/L — AB (ref 3.5–5.1)
Potassium: 3.3 mmol/L — ABNORMAL LOW (ref 3.5–5.1)
Potassium: 3.7 mmol/L (ref 3.5–5.1)
SODIUM: 134 mmol/L — AB (ref 135–145)
Sodium: 138 mmol/L (ref 135–145)
Sodium: 138 mmol/L (ref 135–145)

## 2015-01-07 LAB — GLUCOSE, CAPILLARY
GLUCOSE-CAPILLARY: 161 mg/dL — AB (ref 70–99)
GLUCOSE-CAPILLARY: 144 mg/dL — AB (ref 70–99)
GLUCOSE-CAPILLARY: 107 mg/dL — AB (ref 70–99)
GLUCOSE-CAPILLARY: 69 mg/dL — AB (ref 70–99)
GLUCOSE-CAPILLARY: 60 mg/dL — AB (ref 70–99)
GLUCOSE-CAPILLARY: 115 mg/dL — AB (ref 70–99)
GLUCOSE-CAPILLARY: 208 mg/dL — AB (ref 70–99)
Glucose-Capillary: 108 mg/dL — ABNORMAL HIGH (ref 70–99)
Glucose-Capillary: 124 mg/dL — ABNORMAL HIGH (ref 70–99)
Glucose-Capillary: 173 mg/dL — ABNORMAL HIGH (ref 70–99)
Glucose-Capillary: 194 mg/dL — ABNORMAL HIGH (ref 70–99)
Glucose-Capillary: 239 mg/dL — ABNORMAL HIGH (ref 70–99)
Glucose-Capillary: 289 mg/dL — ABNORMAL HIGH (ref 70–99)
Glucose-Capillary: 75 mg/dL (ref 70–99)
Glucose-Capillary: 93 mg/dL (ref 70–99)

## 2015-01-07 MED ORDER — FENTANYL 25 MCG/HR TD PT72
25.0000 ug | MEDICATED_PATCH | TRANSDERMAL | Status: DC
  Administered 2015-01-07 – 2015-01-13 (×3): 25 ug via TRANSDERMAL
  Filled 2015-01-07 (×3): qty 1

## 2015-01-07 MED ORDER — COLLAGENASE 250 UNIT/GM EX OINT
TOPICAL_OINTMENT | Freq: Every day | CUTANEOUS | Status: DC
  Administered 2015-01-07 – 2015-01-14 (×7): via TOPICAL
  Filled 2015-01-07 (×2): qty 30

## 2015-01-07 MED ORDER — DEXTROSE 50 % IV SOLN
INTRAVENOUS | Status: AC
  Administered 2015-01-07: 12 mL via INTRAVENOUS
  Filled 2015-01-07: qty 50

## 2015-01-07 MED ORDER — SODIUM CHLORIDE 0.9 % IV SOLN
INTRAVENOUS | Status: DC
  Administered 2015-01-07: 09:00:00 via INTRAVENOUS

## 2015-01-07 MED ORDER — OXYCODONE-ACETAMINOPHEN 10-325 MG PO TABS: 1.0000 | ORAL_TABLET | Freq: Three times a day (TID) | ORAL | Status: DC

## 2015-01-07 MED ORDER — DEXTROSE 50 % IV SOLN
12.0000 mL | Freq: Once | INTRAVENOUS | Status: AC
  Administered 2015-01-07: 12 mL via INTRAVENOUS

## 2015-01-07 MED ORDER — INSULIN GLARGINE 100 UNIT/ML ~~LOC~~ SOLN
18.0000 [IU] | Freq: Once | SUBCUTANEOUS | Status: AC
  Administered 2015-01-07: 18 [IU] via SUBCUTANEOUS
  Filled 2015-01-07: qty 0.18

## 2015-01-07 MED ORDER — INSULIN GLARGINE 100 UNIT/ML ~~LOC~~ SOLN
10.0000 [IU] | Freq: Every day | SUBCUTANEOUS | Status: AC
  Administered 2015-01-07: 10 [IU] via SUBCUTANEOUS
  Filled 2015-01-07: qty 0.1

## 2015-01-07 MED ORDER — ENSURE ENLIVE PO LIQD
237.0000 mL | Freq: Three times a day (TID) | ORAL | Status: DC
  Administered 2015-01-07 – 2015-01-11 (×8): 237 mL via ORAL

## 2015-01-07 MED ORDER — INSULIN ASPART 100 UNIT/ML ~~LOC~~ SOLN
0.0000 [IU] | Freq: Three times a day (TID) | SUBCUTANEOUS | Status: DC
  Administered 2015-01-07: 7 [IU] via SUBCUTANEOUS
  Administered 2015-01-07: 4 [IU] via SUBCUTANEOUS

## 2015-01-07 MED ORDER — INSULIN ASPART 100 UNIT/ML ~~LOC~~ SOLN: 0.0000 [IU] | Freq: Every day | SUBCUTANEOUS | Status: DC

## 2015-01-07 MED ORDER — ACETAMINOPHEN 325 MG PO TABS
650.0000 mg | ORAL_TABLET | Freq: Four times a day (QID) | ORAL | Status: DC | PRN
  Administered 2015-01-07 – 2015-01-10 (×3): 650 mg via ORAL
  Filled 2015-01-07 (×2): qty 2

## 2015-01-07 MED ORDER — DEXTROSE 50 % IV SOLN
INTRAVENOUS | Status: AC
Start: 2015-01-07 — End: 2015-01-07
  Filled 2015-01-07: qty 50

## 2015-01-07 MED ORDER — OXYCODONE-ACETAMINOPHEN 5-325 MG PO TABS
1.0000 | ORAL_TABLET | Freq: Three times a day (TID) | ORAL | Status: DC
  Administered 2015-01-07 – 2015-01-14 (×24): 1 via ORAL
  Filled 2015-01-07 (×25): qty 1

## 2015-01-07 MED ORDER — ENOXAPARIN SODIUM 40 MG/0.4ML ~~LOC~~ SOLN
40.0000 mg | SUBCUTANEOUS | Status: DC
  Administered 2015-01-07 – 2015-01-14 (×8): 40 mg via SUBCUTANEOUS
  Filled 2015-01-07 (×8): qty 0.4

## 2015-01-07 MED ORDER — INSULIN GLARGINE 100 UNIT/ML ~~LOC~~ SOLN
20.0000 [IU] | Freq: Every day | SUBCUTANEOUS | Status: DC
  Filled 2015-01-07: qty 0.2

## 2015-01-07 MED ORDER — DEXTROSE 50 % IV SOLN
16.0000 mL | Freq: Once | INTRAVENOUS | Status: AC
  Administered 2015-01-07: 16 mL via INTRAVENOUS
  Filled 2015-01-07: qty 50

## 2015-01-07 MED ORDER — OXYCODONE HCL 5 MG PO TABS
5.0000 mg | ORAL_TABLET | Freq: Three times a day (TID) | ORAL | Status: DC
  Administered 2015-01-07 – 2015-01-14 (×24): 5 mg via ORAL
  Filled 2015-01-07 (×24): qty 1

## 2015-01-07 NOTE — Progress Notes (Signed)
[place] TEAM 1 - Stepdown/ICU TEAM Progress Note  Jason [name] YKD:[id number] DOB: 06/06/1986 DOA: 01/06/2015 PCP: [name], MD  Admit HPI / Brief Narrative: 29 y.o. 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 Patient was discharged from the hospital on 12/01/2014 after being treated for severe sepsis, aspiration pneumonia, and multiple sacral decubitus ulcers with aztreonam, vancomycin and flagyl. IV antibiotics were finished on 3/27 and according to his mother since then he had been getting progressively worse, w/ fever of 101.5, lethargic and not eating.  In the ED patient received Narcan without significant change, his mother reported that he did not get any extra pain medications, he was hypothermic with temperature of 96.5, his WBC was 18.8, patient admitted to stepdown for further evaluation.  HPI/Subjective: Pt is awake and at his mental baseline.  He c/o uncontrolled neck, abdom, and sacral pain.  He denies sob, n/v, or HA.    Assessment/Plan:  Sepsis due to UTI +/- infected sacral decubs Temperature of 96.5, WBCs of 18,000 at presentation - broad-spectrum antibiotics cont for now - ID to see in f/u for abx recs as they follow him as an outpt   Early DKA - uncontrolled DM1 DKA has resolved rapidly w/ usual tx - transition off insulin gtt today and follow CBG   UTI w/ indwelling suprapubic catheter / neurogenic bladder Suprapubic cath was replace by Urology on 12/10/14 during hospital stay - UA notes many yeast - tx w/ diflucan for now but ask ID to weigh in   Chronic hypotension On midodrine - stop all other BP meds   Acute encephalopathy Likely secondary to sepsis - resolved rapidly   History of a spinal cord infarction w/ quadriplegia S/p diverting colostomy  Acute kidney injury Baseline creatinine is 0.7 -  creatinine at presentation 1.73 - crt has normalized w/ volume expansion   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 Linus Salmons in [place] [facility] - will ask ID to weigh in - cont current empiric broad tx for now   Chronic anemia Follow Hgb trend   Severe Protein calorie malnutrition  Code Status: FULL Family Communication: spoke w/ mother at bedside at length  Disposition Plan: SDU  Consultants: ID  Procedures: none  Antibiotics: Aztreonam 3/31 > Vanc 3/31 > Diflucan 3/31 >  DVT prophylaxis: lovenox  Objective: Blood pressure 102/67, pulse 72, temperature 97.6 F (36.4 C), temperature source Oral, resp. rate 12, height 5\' 8"  (1.727 m), weight 59.6 kg (131 lb 6.3 oz), SpO2 100 %.  Intake/Output Summary (Last 24 hours) at 01/07/15 0828 Last data filed at 01/07/15 3976  Gross per 24 hour  Intake 1919.39 ml  Output   1750 ml  Net 169.39 ml   Exam: General: No acute respiratory distress - alert and conversant  Lungs: Clear to auscultation bilaterally without wheezes or crackles Cardiovascular: Regular rate and rhythm without murmur gallop or rub  Abdomen: Nontender, nondistended, soft, bowel sounds positive, no rebound, no ascites, no appreciable mass Extremities: No significant cyanosis, clubbing, or edema bilateral lower extremities  Data Reviewed: Basic Metabolic Panel:  Recent Labs Lab 01/06/15 1015 01/06/15 1942 01/06/15 2200 01/07/15 0109 01/07/15 0440  NA 130* 133* 135 138 138  K 4.6 4.1 3.7 3.4* 3.3*  CL 104 109 110 112 113*  CO2 17* 17* 20 17* 23  GLUCOSE 579* 469* 395* 243* 108*  BUN 23 22 21 22 19   CREATININE 1.73* 1.45* 1.37* 1.33 1.07  CALCIUM 9.9 9.4 9.3 9.7 9.5  MG  --  1.6  --   --   --   PHOS  --  4.5  --   --   --     Liver Function Tests:  Recent Labs Lab 01/06/15 1015  AST 8  ALT 10  ALKPHOS 93  BILITOT 0.5  PROT 7.3  ALBUMIN 2.2*    Recent Labs Lab 01/06/15 1015  AMMONIA 39*   CBC:  Recent Labs Lab 01/06/15 1015  WBC 18.8*  HGB 8.1*  HCT 26.8*  MCV 86.5  PLT 608*    CBG:  Recent Labs Lab 01/07/15 0044 01/07/15 0149 01/07/15 0252 01/07/15 0356 01/07/15 0453  GLUCAP 239* 194* 161* 144* 107*    Recent Results (from the past 240 hour(s))  Blood culture (routine x 2)     Status: None (Preliminary result)   Collection Time: 01/06/15 10:00 AM  Result Value Ref Range Status   Specimen Description BLOOD LEFT ARM  Final   Special Requests BOTTLES DRAWN AEROBIC AND ANAEROBIC 10ML  Final   Culture   Final           BLOOD CULTURE RECEIVED NO GROWTH TO DATE CULTURE WILL BE HELD FOR 5 DAYS BEFORE ISSUING A FINAL NEGATIVE REPORT Performed at [facility]    Report Status PENDING  Incomplete  Blood culture (routine x 2)     Status: None (Preliminary result)   Collection Time: 01/06/15 10:15 AM  Result Value Ref Range Status   Specimen Description BLOOD LEFT HAND  Final   Special Requests   Final    BOTTLES DRAWN AEROBIC AND ANAEROBIC 10ML AEROBIC 7ML ANAEROBIC   Culture   Final           BLOOD CULTURE RECEIVED NO GROWTH TO DATE CULTURE WILL BE HELD FOR 5 DAYS BEFORE ISSUING A FINAL NEGATIVE REPORT Performed at [facility]    Report Status PENDING  Incomplete  Clostridium Difficile by PCR     Status: None   Collection Time: 01/06/15 10:46 AM  Result Value Ref Range Status   C difficile by pcr NEGATIVE NEGATIVE Final     Studies:  Recent x-ray studies have been reviewed in detail by the Attending Physician  Scheduled Meds:  Scheduled Meds: . atorvastatin  20 mg Oral q1800  . aztreonam  1 g Intravenous 3 times per day  . baclofen  10 mg Oral BID WC  . baclofen  20 mg Oral QHS  . dextrose  16 mL Intravenous Once  . dextrose      . fentaNYL  25  mcg Transdermal Q72H  . fluconazole (DIFLUCAN) IV  200 mg Intravenous Q24H  . loratadine  10 mg Oral Daily  . pantoprazole  40 mg Oral Daily  . potassium chloride SA  20 mEq Oral Daily  . pregabalin  100 mg Oral TID  . pregabalin  200 mg Oral Q2200  . QUEtiapine  25 mg Oral TID  . sodium chloride  10-40 mL Intracatheter Q12H  . vancomycin  500 mg Intravenous Q12H    Time spent on care of this patient: 35 mins   MCCLUNG,JEFFREY T , MD   [facility] Office  [phone] Pager - Text Page per Shea Evans as per below:  On-Call/Text Page:      [email or web address]      password [email or web address]  If 7PM-7AM, please contact night-coverage [email or web address] Password [email or web address] 01/07/2015, 8:28 AM   LOS: 1 day

## 2015-01-07 NOTE — Progress Notes (Signed)
Hypoglycemic Event  CBG: 60  Treatment: D50 IV 33mls  Symptoms: None  Follow-up CBG: [id number]:[id number] CBG Result:115  Possible Reasons for Event: Medication regimen: insulin gtt  Comments/MD notified:Dr. Aretta Nip, Camelia Eng  Remember to initiate Hypoglycemia Order Set & complete

## 2015-01-07 NOTE — Progress Notes (Signed)
UR COMPLETED  

## 2015-01-07 NOTE — Consult Note (Addendum)
[facility]    Date of Admission:  01/06/2015  Date of Consult:  01/07/2015  Reason for Consult: Readmission for sepsis and question of the possible source Referring Physician: Dr. Thereasa Solo   HPI: Jason [name] is an 29 y.o. male.  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 He was decribed In December 2015, discharged on vancomycin and followed by Dr Tommy Medal in the ID clinic. He was hospitalized in early february for having worsening fouls smelling drainage to large decubitus ulcer of ischium. MRI done at that time that should fluid collection that required I x D by Dr. Migdalia Dk.He was treated for ischial osteomyelitis, pneumonia, Klebsiella bacteremia, and Bur[place]oderia UTI during the hospitalization. Patient was discharged on IV vancomycin plus ertapenem, which are supposed to be continued for total of 6 weeks when I last saw him.. In addition to his known decubitus ulcer to sacral and right ischial wounds, he has also developed a large full thickness skin burn on his back measuring 15 x 15 cm as a result of Friction rub creating a large blister on his back.   Patient was doing well until Feb 20th when he started hallucinating and having sleep deprivation. He was admitted on 2/24 for further evaluation. He was seen by neurology, plastics, and wound care.   Mother states that patient had hallucination roughly 3 months ago, which was likely caused by morphine, since his symptoms resolved after discontinuation of morphine. This time, mother discontinued patient's Percocet, Lyrica and baclofen without any help. Patient was currently on Vancomycin and Meropenem for his recent positive blood cultures of Klebsiella. Decision was made to discontinue carbapenem and place him on ciprofloxacin, metronidazole plus vancomycin. He was doing well without further hallucinations, and  undergoing serial debridement by Dr. Migdalia Dk. He was last taken to hte OR on 3/2 for placement of ACell and VAC in adiion to 2cm of debridement of skin and soft tissue to sacral/ischial ulcers. ( 5 x 5 x 1.5cm sacral and 4 x 6 x3 cm ischial lesion) and adaptic dressing placed on large back lesion.  He was then switched back to [facility] and was having recurrent hallucinations. He was then changed to vancomycin aztreonam and metronidazole.  He was again seen by Dr. Migdalia Dk who performed irrigation and debridement of his sacral wound are March 18 application of a cell and wound VAC. She exposes a that the patient had extensive debridement down to bone and that there was no ability to further debride his tissues.  The patient was sent home to complete vancomycin aztreonam and metronidazole. Within 24 hours of stopping his vancomycin and aztreonam he became abruptly confused again. His mother who is with him nearly constantly states that his suprapubic catheter became occluded with yeastlike material. The catheter was changed at home but he had persistent confusing despite this and despite the addition of fluconazole. He was brought to the hospital where he was confused and with low blood pressures.  He was admitted to the hospitalist service last night after urine cultures and blood cultures were taken and he was placed back on vancomycin and metronidazole and aztreonam.  He feels improved after antibiotics and IV hydration is also getting fluconazole.  He had his wound looked at by wound care and did not examined myself today in part because wound care examined and secondly because cannot take photos fue to problem with my Haiku app.  WOC note: Wound type:Sacrum 10X8X3cm with 1 cm undermining. Bone palpable with swab. 10% slough to wound edges. Inner wound chronic stage 4 wound; 80% red, 10% yellow slough. Small deeper tunneling area in the center of the wound might open to reveal a track. Mod amt tan  drainage, no odor.  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 Left ischium with unstageable wound; 3X2cm, 100% loose slough, crosshatched to allow better penetration of chemical debridement. Left heel stage 2 wound; 2X3X.1cm, 100% pink and moist. Left outer foot near samll toe with full thickness wound; 1X1X.1cm, pink and moist, small amt yellow drainage, no odor Left anterior leg with healing full thickness wound; 2X2X.1cm pink and dry. Middle back with full thickness wound; 16X20X.1cm, red and moist, mod amt yellow drainage, no odor. Pressure Ulcer POA: Yes     Past Medical History  Diagnosis Date  . GERD (gastroesophageal reflux disease)   . Asthma   . Hx MRSA infection     on face  . Gastroparesis   . Diabetic neuropathy   . Seizures   . Family history of anesthesia complication     Pt mother can't have epidural procedures  . Dysrhythmia   . Pneumonia   . Arthritis   . Fibromyalgia   . 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 Type I diabetes mellitus     sees Dr. Loanne Drilling     Past Surgical History  Procedure Laterality Date  . Tonsillectomy    . Multiple extractions with alveoloplasty N/A 08/03/2014    Procedure: MULTIPLE EXTRACTIONS;  Surgeon: Gae Bon, DDS;  Location: [place];  Service: Oral Surgery;  Laterality: N/A;  . Tee without cardioversion N/A 08/17/2014    Procedure: TRANSESOPHAGEAL ECHOCARDIOGRAM (TEE);  Surgeon: Dorothy Spark, MD;  Location: [facility] ENDOSCOPY;  Service: Cardiovascular;  Laterality: N/A;  . Debridment of decubitus ulcer N/A 10/04/2014    Procedure: DEBRIDMENT OF DECUBITUS ULCER;  Surgeon: Georganna Skeans, MD;  Location: [place];  Service: General;  Laterality: N/A;  . Laparoscopic diverted colostomy N/A 10/12/2014    Procedure: LAPAROSCOPIC DIVERTING COLOSTOMY;  Surgeon: Donnie Mesa, MD;  Location: [place];  Service: General;   Laterality: N/A;  . Insertion of suprapubic catheter N/A 10/12/2014    Procedure: INSERTION OF SUPRAPUBIC CATHETER;  Surgeon: Reece Packer, MD;  Location: [place];  Service: Urology;  Laterality: N/A;  . Incision and drainage of wound N/A 11/12/2014    Procedure: IRRIGATION AND DEBRIDEMENT OF WOUNDS WITH BONE BIOPSY AND SURGICAL PREP ;  Surgeon: Theodoro Kos, DO;  Location: [place];  Service: Plastics;  Laterality: N/A;  . Application of a-cell of back N/A 11/12/2014    Procedure: APPLICATION A CELL AND VAC ;  Surgeon: Theodoro Kos, DO;  Location: [place];  Service: Plastics;  Laterality: N/A;  . Incision and drainage of wound N/A 11/18/2014    Procedure: IRRIGATION AND DEBRIDEMENT OF SACRAL ULCER ONLY WITH PLACEMENT OF A CELL AND VAC/ DRESSING CHANGE TO UPPER BACK AREA.;  Surgeon: Theodoro Kos, DO;  Location: [place];  Service: Plastics;  Laterality: N/A;  . Incision and drainage of wound N/A 11/25/2014    Procedure: IRRIGATION AND DEBRIDEMENT OF SACRAL ULCER AND BACK BURN WITH PLACEMENT OF A-CELL;  Surgeon: Theodoro Kos, DO;  Location: [place];  Service: Plastics;  Laterality: N/A;  . Minor application of wound vac N/A  11/25/2014    Procedure:  WOUND VAC CHANGE;  Surgeon: Theodoro Kos, DO;  Location: [place];  Service: Plastics;  Laterality: N/A;  . Incision and drainage of wound Right 12/08/2014    Procedure: IRRIGATION AND DEBRIDEMENT SACRAL WOUND AND RIGHT ISCHIAL WOUND ;  Surgeon: Theodoro Kos, DO;  Location: [place];  Service: Plastics;  Laterality: Right;  . Application of a-cell of back N/A 12/08/2014    Procedure: APPLICATION OF A-CELL AND WOUND VAC ;  Surgeon: Theodoro Kos, DO;  Location: [place];  Service: Plastics;  Laterality: N/A;  ergies:   Allergies  Allergen Reactions  . Cefuroxime Axetil Anaphylaxis  . Ertapenem Other (See Comments)    Rash and confusion  . Morphine And Related Other (See Comments)    Changed mental status, confusion, headache, visual hallucination  . Penicillins  Anaphylaxis and Other (See Comments)    ?can take amoxicillin?  . Sulfa Antibiotics Anaphylaxis, Shortness Of Breath and Other (See Comments)  . Tessalon [Benzonatate] Anaphylaxis  . Shellfish Allergy Itching and Other (See Comments)    Took benadryl to alleviate reaction     Medications: I have reviewed patients current medications as documented in Epic Anti-infectives    Start     Dose/Rate Route Frequency Ordered Stop   01/07/15 0400  vancomycin (VANCOCIN) 500 mg in sodium chloride 0.9 % 100 mL IVPB     500 mg 100 mL/hr over 60 Minutes Intravenous Every 12 hours 01/06/15 1738     01/06/15 1600  fluconazole (DIFLUCAN) IVPB 200 mg     200 mg 100 mL/hr over 60 Minutes Intravenous Every 24 hours 01/06/15 1521     01/06/15 1600  vancomycin (VANCOCIN) IVPB 1000 mg/200 mL premix     1,000 mg 200 mL/hr over 60 Minutes Intravenous  Once 01/06/15 1521 01/06/15 1722   01/06/15 1515  vancomycin (VANCOCIN) IVPB 1000 mg/200 mL premix  Status:  Discontinued     1,000 mg 200 mL/hr over 60 Minutes Intravenous Every 24 hours 01/06/15 1514 01/06/15 1515   01/06/15 1300  aztreonam (AZACTAM) 1 g in dextrose 5 % 50 mL IVPB     1 g 100 mL/hr over 30 Minutes Intravenous 3 times per day 01/06/15 1227        Social History:  reports that he quit smoking about 4 months ago. His smoking use included Cigars and Cigarettes. He quit after 12 years of use. He has never used smokeless tobacco. He reports that he does not drink alcohol or use illicit drugs.  Family History  Problem Relation Age of Onset  . Diabetes Father   . Hypertension Father   . Asthma      fhx  . Hypertension      fhx  . Stroke      fhx  . Heart disease Mother     As in HPI and primary teams notes otherwise 12 point review of systems is negative  Blood pressure 123/83, pulse 78, temperature 98.1 F (36.7 C), temperature source Oral, resp. rate 8, height 5' 8"  (1.727 m), weight 131 lb 6.3 oz (59.6 kg), SpO2 99 %. General: Alert  and awake, oriented x3, not in any acute distress. HEENT: anicteric sclera, pupils reactive to light and accommodation, EOMI, oropharynx clear and without exudate CVS regular rate, normal r,  no murmur rubs or gallops Chest: clear to auscultation bilaterally, no wheezing, rales or rhonchi Abdomen: soft nontender, ostomy clean and suprapubic catheter clean    clubbing or edema noted bilaterally  Skin: I did not examine his decubitus ulcers today myself I did examine his feet and his diabetic foot ulcers do not seem to change much since I last saw him.  Neuro: ;paraplegic   Results for orders placed or performed during the hospital encounter of 01/06/15 (from the past 48 hour(s))  Blood culture (routine x 2)     Status: None (Preliminary result)   Collection Time: 01/06/15 10:00 AM  Result Value Ref Range   Specimen Description BLOOD LEFT ARM    Special Requests BOTTLES DRAWN AEROBIC AND ANAEROBIC 10ML    Culture             BLOOD CULTURE RECEIVED NO GROWTH TO DATE CULTURE WILL BE HELD FOR 5 DAYS BEFORE ISSUING A FINAL NEGATIVE REPORT Performed at [facility]    Report Status PENDING   I-Stat arterial blood gas, ED     Status: Abnormal   Collection Time: 01/06/15 10:04 AM  Result Value Ref Range   pH, Arterial 7.287 (L) 7.350 - 7.450   pCO2 arterial 31.4 (L) 35.0 - 45.0 mmHg   pO2, Arterial 67.0 (L) 80.0 - 100.0 mmHg   Bicarbonate 15.1 (L) 20.0 - 24.0 mEq/L   TCO2 16 0 - 100 mmol/L   O2 Saturation 92.0 %   Acid-base deficit 11.0 (H) 0.0 - 2.0 mmol/L   Patient temperature 97.5 F    Collection site RADIAL, ALLEN'S TEST ACCEPTABLE    Drawn by Operator    Sample type ARTERIAL   CBC     Status: Abnormal   Collection Time: 01/06/15 10:15 AM  Result Value Ref Range   WBC 18.8 (H) 4.0 - 10.5 K/uL   RBC 3.10 (L) 4.22 - 5.81 MIL/uL   Hemoglobin 8.1 (L) 13.0 - 17.0 g/dL   HCT 26.8 (L) 39.0 - 52.0 %   MCV 86.5 78.0 - 100.0 fL   MCH 26.1 26.0 - 34.0 pg   MCHC 30.2 30.0 - 36.0  g/dL   RDW 15.7 (H) 11.5 - 15.5 %   Platelets 608 (H) 150 - 400 K/uL  Comprehensive metabolic panel     Status: Abnormal   Collection Time: 01/06/15 10:15 AM  Result Value Ref Range   Sodium 130 (L) 135 - 145 mmol/L   Potassium 4.6 3.5 - 5.1 mmol/L   Chloride 104 96 - 112 mmol/L   CO2 17 (L) 19 - 32 mmol/L   Glucose, Bld 579 (HH) 70 - 99 mg/dL    Comment: REPEATED TO VERIFY CRITICAL RESULT CALLED TO, READ BACK BY AND VERIFIED WITH: SARA SLACK,RN AT 1120 01/06/15 BY ZBEECH    BUN 23 6 - 23 mg/dL   Creatinine, Ser 1.73 (H) 0.50 - 1.35 mg/dL   Calcium 9.9 8.4 - 10.5 mg/dL   Total Protein 7.3 6.0 - 8.3 g/dL   Albumin 2.2 (L) 3.5 - 5.2 g/dL   AST 8 0 - 37 U/L   ALT 10 0 - 53 U/L   Alkaline Phosphatase 93 39 - 117 U/L   Total Bilirubin 0.5 0.3 - 1.2 mg/dL   GFR calc non Af Amer 51 (L) >90 mL/min   GFR calc Af Amer 59 (L) >90 mL/min    Comment: (NOTE) The eGFR has been calculated using the CKD EPI equation. This calculation has not been validated in all clinical situations. eGFR's persistently <90 mL/min signify possible Chronic Kidney Disease.    Anion gap 9 5 - 15  Blood culture (routine x 2)  Status: None (Preliminary result)   Collection Time: 01/06/15 10:15 AM  Result Value Ref Range   Specimen Description BLOOD LEFT HAND    Special Requests      BOTTLES DRAWN AEROBIC AND ANAEROBIC 10ML AEROBIC 7ML ANAEROBIC   Culture             BLOOD CULTURE RECEIVED NO GROWTH TO DATE CULTURE WILL BE HELD FOR 5 DAYS BEFORE ISSUING A FINAL NEGATIVE REPORT Performed at [facility]    Report Status PENDING   Ammonia     Status: Abnormal   Collection Time: 01/06/15 10:15 AM  Result Value Ref Range   Ammonia 39 (H) 11 - 32 umol/L  I-stat troponin, ED     Status: None   Collection Time: 01/06/15 10:26 AM  Result Value Ref Range   Troponin i, poc 0.00 0.00 - 0.08 ng/mL   Comment 3            Comment: Due to the release kinetics of cTnI, a negative result within the first  hours of the onset of symptoms does not rule out myocardial infarction with certainty. If myocardial infarction is still suspected, repeat the test at appropriate intervals.   Clostridium Difficile by PCR     Status: None   Collection Time: 01/06/15 10:46 AM  Result Value Ref Range   C difficile by pcr NEGATIVE NEGATIVE  Urinalysis, Routine w reflex microscopic     Status: Abnormal   Collection Time: 01/06/15 10:56 AM  Result Value Ref Range   Color, Urine YELLOW YELLOW   APPearance TURBID (A) CLEAR   Specific Gravity, Urine 1.020 1.005 - 1.030   pH 5.0 5.0 - 8.0   Glucose, UA >1000 (A) NEGATIVE mg/dL   Hgb urine dipstick MODERATE (A) NEGATIVE   Bilirubin Urine NEGATIVE NEGATIVE   Ketones, ur NEGATIVE NEGATIVE mg/dL   Protein, ur 30 (A) NEGATIVE mg/dL   Urobilinogen, UA 0.2 0.0 - 1.0 mg/dL   Nitrite NEGATIVE NEGATIVE   Leukocytes, UA MODERATE (A) NEGATIVE  Urine microscopic-add on     Status: None   Collection Time: 01/06/15 10:56 AM  Result Value Ref Range   WBC, UA 21-50 <3 WBC/hpf   RBC / HPF 7-10 <3 RBC/hpf   Bacteria, UA RARE RARE   Urine-Other MANY YEAST   Urine rapid drug screen (hosp performed)     Status: Abnormal   Collection Time: 01/06/15 10:56 AM  Result Value Ref Range   Opiates POSITIVE (A) NONE DETECTED   Cocaine NONE DETECTED NONE DETECTED   Benzodiazepines NONE DETECTED NONE DETECTED   Amphetamines NONE DETECTED NONE DETECTED   Tetrahydrocannabinol NONE DETECTED NONE DETECTED   Barbiturates NONE DETECTED NONE DETECTED    Comment:        DRUG SCREEN FOR MEDICAL PURPOSES ONLY.  IF CONFIRMATION IS NEEDED FOR ANY PURPOSE, NOTIFY LAB WITHIN 5 DAYS.        LOWEST DETECTABLE LIMITS FOR URINE DRUG SCREEN Drug Class       Cutoff (ng/mL) Amphetamine      1000 Barbiturate      200 Benzodiazepine   016 Tricyclics       010 Opiates          300 Cocaine          300 THC              50   Glucose, capillary     Status: Abnormal   Collection Time: 01/06/15   6:55 PM  Result Value Ref Range   Glucose-Capillary 466 (H) 70 - 99 mg/dL  Lactic acid, plasma     Status: None   Collection Time: 01/06/15  7:42 PM  Result Value Ref Range   Lactic Acid, Venous 1.1 0.5 - 2.0 mmol/L  Basic metabolic panel (stat then every 4 hours)     Status: Abnormal   Collection Time: 01/06/15  7:42 PM  Result Value Ref Range   Sodium 133 (L) 135 - 145 mmol/L   Potassium 4.1 3.5 - 5.1 mmol/L   Chloride 109 96 - 112 mmol/L   CO2 17 (L) 19 - 32 mmol/L   Glucose, Bld 469 (H) 70 - 99 mg/dL   BUN 22 6 - 23 mg/dL   Creatinine, Ser 1.45 (H) 0.50 - 1.35 mg/dL   Calcium 9.4 8.4 - 10.5 mg/dL   GFR calc non Af Amer 64 (L) >90 mL/min   GFR calc Af Amer 74 (L) >90 mL/min    Comment: (NOTE) The eGFR has been calculated using the CKD EPI equation. This calculation has not been validated in all clinical situations. eGFR's persistently <90 mL/min signify possible Chronic Kidney Disease.    Anion gap 7 5 - 15  Magnesium     Status: None   Collection Time: 01/06/15  7:42 PM  Result Value Ref Range   Magnesium 1.6 1.5 - 2.5 mg/dL  Phosphorus     Status: None   Collection Time: 01/06/15  7:42 PM  Result Value Ref Range   Phosphorus 4.5 2.3 - 4.6 mg/dL  Glucose, capillary     Status: Abnormal   Collection Time: 01/06/15  8:06 PM  Result Value Ref Range   Glucose-Capillary 428 (H) 70 - 99 mg/dL  Glucose, capillary     Status: Abnormal   Collection Time: 01/06/15  9:51 PM  Result Value Ref Range   Glucose-Capillary 359 (H) 70 - 99 mg/dL  Basic metabolic panel (stat then every 4 hours)     Status: Abnormal   Collection Time: 01/06/15 10:00 PM  Result Value Ref Range   Sodium 135 135 - 145 mmol/L   Potassium 3.7 3.5 - 5.1 mmol/L   Chloride 110 96 - 112 mmol/L   CO2 20 19 - 32 mmol/L   Glucose, Bld 395 (H) 70 - 99 mg/dL   BUN 21 6 - 23 mg/dL   Creatinine, Ser 1.37 (H) 0.50 - 1.35 mg/dL   Calcium 9.3 8.4 - 10.5 mg/dL   GFR calc non Af Amer 68 (L) >90 mL/min   GFR calc  Af Amer 79 (L) >90 mL/min    Comment: (NOTE) The eGFR has been calculated using the CKD EPI equation. This calculation has not been validated in all clinical situations. eGFR's persistently <90 mL/min signify possible Chronic Kidney Disease.    Anion gap 5 5 - 15  Glucose, capillary     Status: Abnormal   Collection Time: 01/06/15 10:48 PM  Result Value Ref Range   Glucose-Capillary 327 (H) 70 - 99 mg/dL  Glucose, capillary     Status: Abnormal   Collection Time: 01/06/15 11:47 PM  Result Value Ref Range   Glucose-Capillary 289 (H) 70 - 99 mg/dL  Glucose, capillary     Status: Abnormal   Collection Time: 01/07/15 12:44 AM  Result Value Ref Range   Glucose-Capillary 239 (H) 70 - 99 mg/dL  Basic metabolic panel (stat then every 4 hours)     Status: Abnormal   Collection Time: 01/07/15  1:09  AM  Result Value Ref Range   Sodium 138 135 - 145 mmol/L   Potassium 3.4 (L) 3.5 - 5.1 mmol/L   Chloride 112 96 - 112 mmol/L   CO2 17 (L) 19 - 32 mmol/L   Glucose, Bld 243 (H) 70 - 99 mg/dL   BUN 22 6 - 23 mg/dL   Creatinine, Ser 1.33 0.50 - 1.35 mg/dL   Calcium 9.7 8.4 - 10.5 mg/dL   GFR calc non Af Amer 71 (L) >90 mL/min   GFR calc Af Amer 82 (L) >90 mL/min    Comment: (NOTE) The eGFR has been calculated using the CKD EPI equation. This calculation has not been validated in all clinical situations. eGFR's persistently <90 mL/min signify possible Chronic Kidney Disease.    Anion gap 9 5 - 15  Glucose, capillary     Status: Abnormal   Collection Time: 01/07/15  1:49 AM  Result Value Ref Range   Glucose-Capillary 194 (H) 70 - 99 mg/dL  Glucose, capillary     Status: Abnormal   Collection Time: 01/07/15  2:52 AM  Result Value Ref Range   Glucose-Capillary 161 (H) 70 - 99 mg/dL  Glucose, capillary     Status: Abnormal   Collection Time: 01/07/15  3:56 AM  Result Value Ref Range   Glucose-Capillary 144 (H) 70 - 99 mg/dL  Basic metabolic panel (stat then every 4 hours)     Status:  Abnormal   Collection Time: 01/07/15  4:40 AM  Result Value Ref Range   Sodium 138 135 - 145 mmol/L   Potassium 3.3 (L) 3.5 - 5.1 mmol/L   Chloride 113 (H) 96 - 112 mmol/L   CO2 23 19 - 32 mmol/L   Glucose, Bld 108 (H) 70 - 99 mg/dL   BUN 19 6 - 23 mg/dL   Creatinine, Ser 1.07 0.50 - 1.35 mg/dL   Calcium 9.5 8.4 - 10.5 mg/dL   GFR calc non Af Amer >90 >90 mL/min   GFR calc Af Amer >90 >90 mL/min    Comment: (NOTE) The eGFR has been calculated using the CKD EPI equation. This calculation has not been validated in all clinical situations. eGFR's persistently <90 mL/min signify possible Chronic Kidney Disease.    Anion gap 2 (L) 5 - 15    Comment: REPEATED TO VERIFY  Glucose, capillary     Status: Abnormal   Collection Time: 01/07/15  4:53 AM  Result Value Ref Range   Glucose-Capillary 107 (H) 70 - 99 mg/dL  Glucose, capillary     Status: Abnormal   Collection Time: 01/07/15  5:57 AM  Result Value Ref Range   Glucose-Capillary 69 (L) 70 - 99 mg/dL  Glucose, capillary     Status: Abnormal   Collection Time: 01/07/15  6:13 AM  Result Value Ref Range   Glucose-Capillary 108 (H) 70 - 99 mg/dL  Glucose, capillary     Status: None   Collection Time: 01/07/15  7:16 AM  Result Value Ref Range   Glucose-Capillary 75 70 - 99 mg/dL  Glucose, capillary     Status: Abnormal   Collection Time: 01/07/15  8:15 AM  Result Value Ref Range   Glucose-Capillary 60 (L) 70 - 99 mg/dL  Glucose, capillary     Status: Abnormal   Collection Time: 01/07/15  8:48 AM  Result Value Ref Range   Glucose-Capillary 115 (H) 70 - 99 mg/dL  Glucose, capillary     Status: None   Collection Time: 01/07/15  9:51 AM  Result Value Ref Range   Glucose-Capillary 93 70 - 99 mg/dL  Basic metabolic panel     Status: Abnormal   Collection Time: 01/07/15  1:55 PM  Result Value Ref Range   Sodium 134 (L) 135 - 145 mmol/L   Potassium 3.7 3.5 - 5.1 mmol/L   Chloride 111 96 - 112 mmol/L   CO2 20 19 - 32 mmol/L    Glucose, Bld 223 (H) 70 - 99 mg/dL   BUN 16 6 - 23 mg/dL   Creatinine, Ser 0.81 0.50 - 1.35 mg/dL   Calcium 9.5 8.4 - 10.5 mg/dL   GFR calc non Af Amer >90 >90 mL/min   GFR calc Af Amer >90 >90 mL/min    Comment: (NOTE) The eGFR has been calculated using the CKD EPI equation. This calculation has not been validated in all clinical situations. eGFR's persistently <90 mL/min signify possible Chronic Kidney Disease.    Anion gap 3 (L) 5 - 15  Glucose, capillary     Status: Abnormal   Collection Time: 01/07/15  2:35 PM  Result Value Ref Range   Glucose-Capillary 208 (H) 70 - 99 mg/dL   @BRIEFLABTABLE (sdes,specrequest,cult,reptstatus)   ) Recent Results (from the past 720 hour(s))  Culture, blood (routine x 2)     Status: None   Collection Time: 12/10/14  4:55 PM  Result Value Ref Range Status   Specimen Description BLOOD  Final   Special Requests NONE  Final   Culture   Final    NO GROWTH 5 DAYS Performed at [facility]    Report Status 12/17/2014 FINAL  Final  Culture, blood (routine x 2)     Status: None   Collection Time: 12/10/14  5:00 PM  Result Value Ref Range Status   Specimen Description BLOOD LEFT ARM  Final   Special Requests BOTTLES DRAWN AEROBIC ONLY 1 CC  Final   Culture   Final    NO GROWTH 5 DAYS Performed at [facility]    Report Status 12/17/2014 FINAL  Final  Urine culture     Status: None   Collection Time: 12/10/14  6:11 PM  Result Value Ref Range Status   Specimen Description URINE, CATHETERIZED  Final   Special Requests NONE  Final   Colony Count NO GROWTH Performed at [facility]   Final   Culture NO GROWTH Performed at [facility]   Final   Report Status 12/12/2014 FINAL  Final  Clostridium Difficile by PCR     Status: None   Collection Time: 12/12/14  4:22 AM  Result Value Ref Range Status   C difficile by pcr NEGATIVE NEGATIVE Final  MRSA PCR Screening     Status: None   Collection Time: 12/12/14   3:27 PM  Result Value Ref Range Status   MRSA by PCR NEGATIVE NEGATIVE Final    Comment:        The GeneXpert MRSA Assay (FDA approved for NASAL specimens only), is one component of a comprehensive MRSA colonization surveillance program. It is not intended to diagnose MRSA infection nor to guide or monitor treatment for MRSA infections.   Blood culture (routine x 2)     Status: None (Preliminary result)   Collection Time: 01/06/15 10:00 AM  Result Value Ref Range Status   Specimen Description BLOOD LEFT ARM  Final   Special Requests BOTTLES DRAWN AEROBIC AND ANAEROBIC 10ML  Final   Culture   Final  BLOOD CULTURE RECEIVED NO GROWTH TO DATE CULTURE WILL BE HELD FOR 5 DAYS BEFORE ISSUING A FINAL NEGATIVE REPORT Performed at [facility]    Report Status PENDING  Incomplete  Blood culture (routine x 2)     Status: None (Preliminary result)   Collection Time: 01/06/15 10:15 AM  Result Value Ref Range Status   Specimen Description BLOOD LEFT HAND  Final   Special Requests   Final    BOTTLES DRAWN AEROBIC AND ANAEROBIC 10ML AEROBIC 7ML ANAEROBIC   Culture   Final           BLOOD CULTURE RECEIVED NO GROWTH TO DATE CULTURE WILL BE HELD FOR 5 DAYS BEFORE ISSUING A FINAL NEGATIVE REPORT Performed at [facility]    Report Status PENDING  Incomplete  Clostridium Difficile by PCR     Status: None   Collection Time: 01/06/15 10:46 AM  Result Value Ref Range Status   C difficile by pcr NEGATIVE NEGATIVE Final     Impression/Recommendation  Principal Problem:   Sepsis Active Problems:   IDDM (insulin dependent diabetes mellitus)   Spinal cord infarction (history of)   Acute encephalopathy   UTI (lower urinary tract infection)   Stage IV decubitus ulcer   AKI (acute kidney injury)   Suprapubic catheter   DKA (diabetic ketoacidoses)   Jason [name] is a 29 y.o. male with  paraplegia chronic sacral decubitus ulcerations and chronic osteomyelitis  status post multiple debridements by Dr. Migdalia Dk multiple courses of antibiotics who continues with the another cycle of admission for "sepsis".  #1 Sepsis: As usual there are multiple possible sources in this patient. His urine had become cloudy with material that the mom thought was due to yeast at although Candida in the bladder typically does not cause hypotension or confusion. He has urine cultures cooking. Blood cultures are incubating  I worry about his decubitus ulcers were he has had extensive debridements. Perhaps he has developed new pockets of infection that need debridement in the lateral planes  Recommend following blood cultures urine cultures  His broad array of antibiotic allergies are handicapping her ability to treat him properly  Would strongly consider getting an MRI of his pelvis again.  I have always been skeptical of this patient's ability to do well at home. His mother is very vigilant and try to get him to do all the things that he needs to do but I know after having had conversations with her that he is reluctant to be turned or to move much and I fear that we are finding a losing battle without proper unloading of this wound at home.  #2 Diabetic foot ulcers: They look fairly stable to me I will check the last time they have been imaged in neck sure that he has had all the appropriate test performed. Will engaged diabetic foot focused order set as needed and check prealbumin  #3 Yeast in urine:  If he is still growing candida despite the change of suprapubic catheter then it needs to be changed again  OK to continue fluconazole in the inteirm  Dr. Megan Salon available on the weekend for questions.  I will be back on Monday.  01/07/2015, 4:48 PM   Thank you so much for this interesting consult  [employer] for [place] [phone] (pager) [phone] (office) 01/07/2015, 4:48 PM  [place] 01/07/2015, 4:48 PM

## 2015-01-07 NOTE — Consult Note (Addendum)
WOC wound consult note Reason for Consult: Pt is familiar to [facility] team from multiple previous admissions.  Refer to recent progress note on 3/15. Mother at bedside is well informed regarding topical treatment and wound appearance.   Wound type:Sacrum 10X8X3cm with 1 cm undermining.  Bone palpable with swab.  10% slough to wound edges.  Inner wound chronic stage 4 wound; 80% red, 10% yellow slough.  Small deeper tunneling area in the center of the wound might open to reveal a track.  Mod amt tan drainage, no odor.   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 Left ischium with unstageable wound; 3X2cm, 100% loose slough, crosshatched to allow better penetration of chemical debridement. Left heel stage 2 wound; 2X3X.1cm, 100% pink and moist. Left outer foot near samll toe with full thickness wound; 1X1X.1cm, pink and moist, small amt yellow drainage, no odor Left anterior leg with healing full thickness wound; 2X2X.1cm pink and dry. Middle back with full thickness wound; 16X20X.1cm, red and moist, mod amt yellow drainage, no odor. Pressure Ulcer POA: Yes  Pt has a colostomy pouch intact with good seal to LLQ.   Stoma red and viable when visualized through pouch, small amt liquid brown stool.  Pt denies any problems with ostomy and mother denies need for assistance with pouching activities.  Supplies ordered to bedside for patient and family use during hospital stay.  Continue present plan of care with xeroform to middle back wound to promote moist healing.  Moist gauze packing to right ischium and sacrum.  Santyl for chemical debridement of nonviable tissue to outer edges of sacrum and left ischium wound.  Foam dressings to left foot, heel, and leg.  Prevalon boots are in place to reduce pressure to bilat heels and pt is on an air mattress replacement to reduce pressure.  Discussed plan of care with mother and  patient and they deny further questions at this time. One hour spent performing this consult.  If unable to locate source of sepsis, please consider MRI to r/o osteomyelitis since bone is palpable and mom states sacral wound has had increased odor and drainage during the past week.   Please re-consult if further assistance is needed.  Thank-you,  Julien Girt MSN, [place], [place], [place], [place]

## 2015-01-07 NOTE — Progress Notes (Signed)
INITIAL NUTRITION ASSESSMENT  DOCUMENTATION CODES Per approved criteria  -Severe malnutrition in the context of chronic illness   INTERVENTION: Ensure Enlive po TID, each supplement provides 350 kcal and 20 grams of protein (includes HMB). Higher in CHO than Glucerna and may need additional insulin.   Provide nourishment snacks ([place] sandwich, yogurt). Ordered.  NUTRITION DIAGNOSIS: Malnutrition related to chronic illness as evidenced by intake >/= 75% of his needs for >/= 1 month, severe fat depletion, and 6% weight loss x 1 month per mom.   Goal: Pt to meet >/= 90% of their estimated nutrition needs   Monitor:  PO intake, supplement acceptance, skin  Reason for Assessment: Pt identified as at nutrition risk on the Malnutrition Screen Tool  ASSESSMENT: Pt with hx of type 1 DM, spinal infarct with quadriplegia, and stage IV PU and ischial osteomyelitis, who was admitted with lethargy and fever determined to be sepsis. Pt was recently hospitalized for the same.  Potassium low on replacement History obtained from mom and pt. Per mom pt has not been eating as well as he used to and has lost about 10 lb in the last month. Per mom pt usually has a good appetite but for the last month he has at very little despite her offering him his favorite foods. Of note pt has been hospitalized in the last month due to osteomyelitis and UTI. For the last week he has only had soup and no solid food.  Explained options for supplements. Pt willing to try Ensure Enlive as it is higher in kcal and protein and includes HMB for wound healing.   Nutrition Focused Physical Exam:  Subcutaneous Fat:  Orbital Region: WDL Upper Arm Region: severe depletion Thoracic and Lumbar Region: severe depletion  Muscle:  Temple Region: WDL Clavicle Bone Region: mild/moderate depletion  Clavicle and Acromion Bone Region: mild/moderate depletion Scapular Bone Region: mild/moderate depletion Dorsal Hand: NA Patellar  Region: NA Anterior Thigh Region: NA Posterior Calf Region: NA  Edema: not present  Height: Ht Readings from Last 1 Encounters:  01/06/15 5\' 8"  (1.727 m)    Weight: Wt Readings from Last 1 Encounters:  01/06/15 131 lb 6.3 oz (59.6 kg)    Ideal Body Weight: 63.1 kg   % Ideal Body Weight: 94%  Wt Readings from Last 10 Encounters:  01/06/15 131 lb 6.3 oz (59.6 kg)  12/19/14 130 lb 1.1 oz (59 kg)  11/25/14 155 lb (70.308 kg)  11/19/14 155 lb 13.8 oz (70.7 kg)  10/02/14 125 lb 10.6 oz (57 kg)  09/23/14 131 lb 1.6 oz (59.467 kg)  08/13/14 129 lb 6.6 oz (58.7 kg)  08/03/14 139 lb (63.05 kg)  07/30/14 139 lb 1 oz (63.078 kg)  07/19/14 135 lb 12.9 oz (61.6 kg)    Usual Body Weight: 140 lb   % Usual Body Weight: 94%  BMI:  Body mass index is 19.98 kg/(m^2).  Estimated Nutritional Needs: Kcal: 2100-2300 Protein: 105- 120 grams Fluid: 2.1 - 2.3 L/day  Skin:  Unstageable PU: ischial tuberosity Stage IV PU: ischial tuberosity Stage III PU: left toe Stage IV PU: sacrum Stage IV PU: right ischial tuberosity Stage II PU: left finger Stage II PU: buttocks Stage III PU: left heel  Diet Order: Diet Carb Modified Fluid consistency:: Thin; Room service appropriate?: Yes  EDUCATION NEEDS: -Education needs addressed   Intake/Output Summary (Last 24 hours) at 01/07/15 1108 Last data filed at 01/07/15 0815  Gross per 24 hour  Intake 1919.39 ml  Output  1750 ml  Net 169.39 ml    Last BM: via colostomy  No output documented yet  Labs:   Recent Labs Lab 01/06/15 1942 01/06/15 2200 01/07/15 0109 01/07/15 0440  NA 133* 135 138 138  K 4.1 3.7 3.4* 3.3*  CL 109 110 112 113*  CO2 17* 20 17* 23  BUN 22 21 22 19   CREATININE 1.45* 1.37* 1.33 1.07  CALCIUM 9.4 9.3 9.7 9.5  MG 1.6  --   --   --   PHOS 4.5  --   --   --   GLUCOSE 469* 395* 243* 108*    CBG (last 3)   Recent Labs  01/07/15 0613 01/07/15 0716 01/07/15 0815  GLUCAP 108* 75 60*    Scheduled  Meds: . atorvastatin  20 mg Oral q1800  . aztreonam  1 g Intravenous 3 times per day  . baclofen  10 mg Oral BID WC  . baclofen  20 mg Oral QHS  . fentaNYL  25 mcg Transdermal Q72H  . fluconazole (DIFLUCAN) IV  200 mg Intravenous Q24H  . insulin aspart  0-20 Units Subcutaneous TID WC  . insulin aspart  0-5 Units Subcutaneous QHS  . insulin glargine  10 Units Subcutaneous QHS  . [START ON 01/08/2015] insulin glargine  20 Units Subcutaneous QHS  . loratadine  10 mg Oral Daily  . oxyCODONE-acetaminophen  1 tablet Oral TID PC   And  . oxyCODONE  5 mg Oral TID PC  . pantoprazole  40 mg Oral Daily  . potassium chloride SA  20 mEq Oral Daily  . pregabalin  100 mg Oral TID  . pregabalin  200 mg Oral Q2200  . QUEtiapine  25 mg Oral TID  . vancomycin  500 mg Intravenous Q12H    Continuous Infusions: . sodium chloride 75 mL/hr at 01/07/15 [name], LDN, CNSC [phone] Pager [phone] After Hours Pager

## 2015-01-08 ENCOUNTER — Inpatient Hospital Stay (HOSPITAL_COMMUNITY): Payer: Medicaid Other

## 2015-01-08 DIAGNOSIS — L97519 Non-pressure chronic ulcer of other part of right foot with unspecified severity: Secondary | ICD-10-CM

## 2015-01-08 DIAGNOSIS — L97529 Non-pressure chronic ulcer of other part of left foot with unspecified severity: Secondary | ICD-10-CM

## 2015-01-08 LAB — GLUCOSE, CAPILLARY
GLUCOSE-CAPILLARY: 56 mg/dL — AB (ref 70–99)
GLUCOSE-CAPILLARY: 119 mg/dL — AB (ref 70–99)
Glucose-Capillary: 121 mg/dL — ABNORMAL HIGH (ref 70–99)
Glucose-Capillary: 73 mg/dL (ref 70–99)
Glucose-Capillary: 182 mg/dL — ABNORMAL HIGH (ref 70–99)

## 2015-01-08 LAB — CBC
HEMATOCRIT: 25.5 % — AB (ref 39.0–52.0)
Hemoglobin: 8.2 g/dL — ABNORMAL LOW (ref 13.0–17.0)
MCH: 26.8 pg (ref 26.0–34.0)
MCHC: 32.2 g/dL (ref 30.0–36.0)
MCV: 83.3 fL (ref 78.0–100.0)
PLATELETS: 660 10*3/uL — AB (ref 150–400)
RBC: 3.06 MIL/uL — ABNORMAL LOW (ref 4.22–5.81)
RDW: 15.7 % — ABNORMAL HIGH (ref 11.5–15.5)
WBC: 13.3 10*3/uL — ABNORMAL HIGH (ref 4.0–10.5)

## 2015-01-08 LAB — COMPREHENSIVE METABOLIC PANEL
ALT: 8 U/L (ref 0–53)
AST: 9 U/L (ref 0–37)
Albumin: 1.9 g/dL — ABNORMAL LOW (ref 3.5–5.2)
Alkaline Phosphatase: 72 U/L (ref 39–117)
Anion gap: 0 — ABNORMAL LOW (ref 5–15)
BUN: 12 mg/dL (ref 6–23)
CO2: 22 mmol/L (ref 19–32)
Calcium: 9.7 mg/dL (ref 8.4–10.5)
Chloride: 117 mmol/L — ABNORMAL HIGH (ref 96–112)
Creatinine, Ser: 0.59 mg/dL (ref 0.50–1.35)
GLUCOSE: 88 mg/dL (ref 70–99)
POTASSIUM: 4 mmol/L (ref 3.5–5.1)
Sodium: 139 mmol/L (ref 135–145)
Total Bilirubin: <0.1 mg/dL — ABNORMAL LOW (ref 0.3–1.2)
Total Protein: 6.5 g/dL (ref 6.0–8.3)

## 2015-01-08 LAB — SEDIMENTATION RATE: Sed Rate: 75 mm/hr — ABNORMAL HIGH (ref 0–16)

## 2015-01-08 LAB — C-REACTIVE PROTEIN: CRP: 4 mg/dL — ABNORMAL HIGH (ref <0.60)

## 2015-01-08 MED ORDER — DEXTROSE 50 % IV SOLN
INTRAVENOUS | Status: AC
  Administered 2015-01-08: 50 mL via INTRAVENOUS
  Filled 2015-01-08: qty 50

## 2015-01-08 MED ORDER — INSULIN GLARGINE 100 UNIT/ML ~~LOC~~ SOLN
18.0000 [IU] | Freq: Every day | SUBCUTANEOUS | Status: DC
  Administered 2015-01-08: 18 [IU] via SUBCUTANEOUS
  Filled 2015-01-08 (×2): qty 0.18

## 2015-01-08 MED ORDER — DEXTROSE 50 % IV SOLN
1.0000 | Freq: Once | INTRAVENOUS | Status: AC
  Administered 2015-01-08: 50 mL via INTRAVENOUS

## 2015-01-08 MED ORDER — GADOBENATE DIMEGLUMINE 529 MG/ML IV SOLN
12.0000 mL | Freq: Once | INTRAVENOUS | Status: AC | PRN
Start: 2015-01-08 — End: 2015-01-08
  Administered 2015-01-08: 12 mL via INTRAVENOUS

## 2015-01-08 NOTE — Progress Notes (Signed)
[facility] TEAM 1 - Stepdown/ICU TEAM Progress Note  Jason [name] XYV:[id number] DOB: [birth year] DOA: 01/06/2015 PCP: [name], MD  Admit HPI / Brief Narrative: 29 y.o. 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 Patient was discharged from the hospital on 12/01/2014 after being treated for severe sepsis, aspiration pneumonia, and multiple sacral decubitus ulcers with aztreonam, vancomycin and flagyl. IV antibiotics were finished on 3/27 and according to his mother since then he had been getting progressively worse, w/ fever of 101.5, lethargic and not eating.  In the ED patient received Narcan without significant change, his mother reported that he did not get any extra pain medications, he was hypothermic with temperature of 96.5, his WBC was 18.8, patient admitted to stepdown for further evaluation.  HPI/Subjective: Pt is resting comfortably this morning w/ no new complaints.  Wounds inspected w/ RN at time of dressing changes.    Assessment/Plan:  Sepsis likely due to infected sacral decubs +/- UTI  Temperature of 96.5, WBCs of 18,000 at presentation - broad-spectrum antibiotics cont for now - ID recs noted - see individual issues below - pt is now hemodynamically stable   Stage IV sacral decubitus ulcer with ischial ulcers w/ osteomyelitis - various other skin wounds Patient was recently getting treated for ischial osteomyelitis with vancomycin, aztreonam and Flagyl - IV antibiotics finished on 01/02/2015 - has been evaluated on multiple occasions by Plastics - followed by Dr. Linus Salmons in ID clinic - ID now following w/ [name] - cont current empiric broad tx for now - MRI of pelvis ordered as suggested by ID   UTI w/ indwelling suprapubic catheter / neurogenic bladder Suprapubic cath was replace by Urology on 12/10/14 during hospital stay,  and possibly since then at home? - UA notes many yeast, but as per ID doubt yeast UTI is source of his sepsis, and yeast likely simply a colonizer - tx w/ diflucan for now and follow cx as suggested by ID - if culture + for signif yeast will have to change out catheter   Early DKA - uncontrolled DM1 DKA has resolved rapidly w/ usual tx - transitioned off insulin gtt - CBGs are variable at present, with occasional hypoglycemia - decrease lantus - follow trend    Chronic hypotension On midodrine - stopped all other BP meds - BP currently stable   Acute encephalopathy  Likely secondary to sepsis - resolved rapidly and now at baseline mental status   History of a spinal cord infarction w/ quadriplegia S/p diverting colostomy and suprapubic urinary cath   Acute kidney injury Baseline creatinine is 0.7 - creatinine at presentation 1.73 - crt has normalized w/ volume expansion   Chronic anemia Follow Hgb trend - stable at present   Severe Protein calorie malnutrition  Code Status: FULL Family Communication: no family present at time of exam today   Disposition Plan: transfer to med bed - cont empiric abx - f/u MRI pelvis - f/u urine cx   Consultants: ID  Procedures: none  Antibiotics: Aztreonam 3/31 > Vanc 3/31 > Diflucan 4/01 >  DVT prophylaxis: lovenox  Objective: Blood pressure 108/68, pulse 79, temperature 97.8 F (36.6 C), temperature source Oral, resp. rate 16, height 5\' 8"  (1.727 m), weight 59.6 kg (131 lb 6.3 oz), SpO2 98 %.  Intake/Output Summary (Last 24 hours) at 01/08/15 2863 Last data filed at  01/08/15 0900  Gross per 24 hour  Intake   3280 ml  Output   2575 ml  Net    705 ml   Exam: General: No acute respiratory distress - alert and conversant  Lungs: Clear to auscultation bilaterally without wheezes or crackles Cardiovascular: Regular rate and rhythm without murmur gallop or rub  Abdomen: Nontender, nondistended, soft, bowel sounds positive, no  rebound Extremities: No significant cyanosis, clubbing, or edema bilateral lower extremities Wounds:  All wounds inspected w/ RN - no significant purulent d/c or surrounding erythema noted of sacral, ischial, or lateral back wounds   Data Reviewed: Basic Metabolic Panel:  Recent Labs Lab 01/06/15 1942 01/06/15 2200 01/07/15 0109 01/07/15 0440 01/07/15 1355 01/08/15 0400  NA 133* 135 138 138 134* 139  K 4.1 3.7 3.4* 3.3* 3.7 4.0  CL 109 110 112 113* 111 117*  CO2 17* 20 17* 23 20 22   GLUCOSE 469* 395* 243* 108* 223* 88  BUN 22 21 22 19 16 12   CREATININE 1.45* 1.37* 1.33 1.07 0.81 0.59  CALCIUM 9.4 9.3 9.7 9.5 9.5 9.7  MG 1.6  --   --   --   --   --   PHOS 4.5  --   --   --   --   --    Liver Function Tests:  Recent Labs Lab 01/06/15 1015 01/08/15 0400  AST 8 9  ALT 10 8  ALKPHOS 93 72  BILITOT 0.5 <0.1*  PROT 7.3 6.5  ALBUMIN 2.2* 1.9*    Recent Labs Lab 01/06/15 1015  AMMONIA 39*   CBC:  Recent Labs Lab 01/06/15 1015 01/08/15 0400  WBC 18.8* 13.3*  HGB 8.1* 8.2*  HCT 26.8* 25.5*  MCV 86.5 83.3  PLT 608* 660*    CBG:  Recent Labs Lab 01/07/15 0848 01/07/15 0951 01/07/15 1435 01/07/15 1806 01/07/15 2124  GLUCAP 115* 93 208* 173* 124*    Recent Results (from the past 240 hour(s))  Blood culture (routine x 2)     Status: None (Preliminary result)   Collection Time: 01/06/15 10:00 AM  Result Value Ref Range Status   Specimen Description BLOOD LEFT ARM  Final   Special Requests BOTTLES DRAWN AEROBIC AND ANAEROBIC 10ML  Final   Culture   Final           BLOOD CULTURE RECEIVED NO GROWTH TO DATE CULTURE WILL BE HELD FOR 5 DAYS BEFORE ISSUING A FINAL NEGATIVE REPORT Performed at [facility]    Report Status PENDING  Incomplete  Blood culture (routine x 2)     Status: None (Preliminary result)   Collection Time: 01/06/15 10:15 AM  Result Value Ref Range Status   Specimen Description BLOOD LEFT HAND  Final   Special Requests   Final     BOTTLES DRAWN AEROBIC AND ANAEROBIC 10ML AEROBIC 7ML ANAEROBIC   Culture   Final           BLOOD CULTURE RECEIVED NO GROWTH TO DATE CULTURE WILL BE HELD FOR 5 DAYS BEFORE ISSUING A FINAL NEGATIVE REPORT Performed at [facility]    Report Status PENDING  Incomplete  Clostridium Difficile by PCR     Status: None   Collection Time: 01/06/15 10:46 AM  Result Value Ref Range Status   C difficile by pcr NEGATIVE NEGATIVE Final  Urine culture     Status: None (Preliminary result)   Collection Time: 01/06/15 10:56 AM  Result Value Ref Range Status  Specimen Description URINE, CATHETERIZED  Final   Special Requests Normal  Final   Colony Count PENDING  Incomplete   Culture   Final    Culture reincubated for better growth Performed at [facility]    Report Status PENDING  Incomplete     Studies:  Recent x-ray studies have been reviewed in detail by the Attending Physician  Scheduled Meds:  Scheduled Meds: . aztreonam  1 g Intravenous 3 times per day  . baclofen  10 mg Oral BID WC  . baclofen  20 mg Oral QHS  . collagenase   Topical Daily  . enoxaparin (LOVENOX) injection  40 mg Subcutaneous Q24H  . feeding supplement (ENSURE ENLIVE)  237 mL Oral TID BM  . fentaNYL  25 mcg Transdermal Q72H  . fluconazole (DIFLUCAN) IV  200 mg Intravenous Q24H  . insulin aspart  0-20 Units Subcutaneous TID WC  . insulin aspart  0-5 Units Subcutaneous QHS  . insulin glargine  20 Units Subcutaneous QHS  . loratadine  10 mg Oral Daily  . oxyCODONE-acetaminophen  1 tablet Oral TID PC   And  . oxyCODONE  5 mg Oral TID PC  . pantoprazole  40 mg Oral Daily  . potassium chloride SA  20 mEq Oral Daily  . pregabalin  100 mg Oral TID  . pregabalin  200 mg Oral Q2200  . QUEtiapine  25 mg Oral TID  . vancomycin  500 mg Intravenous Q12H    Time spent on care of this patient: 35 mins   Adalee Kathan T , MD   [facility] Office  [phone] Pager - Text Page per Shea Evans as  per below:  On-Call/Text Page:      [email or web address]      password [email or web address]  If 7PM-7AM, please contact night-coverage [email or web address] Password [email or web address] 01/08/2015, 9:18 AM   LOS: 2 days

## 2015-01-08 NOTE — Progress Notes (Signed)
Hypoglycemic Event  CBG: 56  Treatment: D50 IV 50 mL  Symptoms: None  Follow-up CBG: [id number] CBG Result:110  Possible Reasons for Event: Unknown  Comments/MD notified:dr [email or web address]     Jason [name]  Remember to initiate Hypoglycemia Order Set & complete

## 2015-01-08 NOTE — Progress Notes (Signed)
Report called and pt transferring to 6E09 via bed with belongings.

## 2015-01-08 NOTE — Progress Notes (Signed)
Inpatient Diabetes Program Recommendations  AACE/ADA: New Consensus Statement on Inpatient Glycemic Control (2013)  Target Ranges:  Prepandial:   less than 140 mg/dL      Peak postprandial:   less than 180 mg/dL (1-2 hours)      Critically ill patients:  140 - 180 mg/dL  Pt with hypoglycemia this morning.  Would not recommend increasing Lantus at this time.  Note: Consult received for "foot ulcer". Will follow during this admission for glycemic control. Thank you  Raoul Pitch BSN, RN,CDE Inpatient Diabetes Coordinator [phone] (team pager)

## 2015-01-08 NOTE — Progress Notes (Signed)
Patient ID: Jason [name], male   DOB: 10-Oct-1985, 29 y.o.   MRN: [id number]         [facility]    Date of Admission:  01/06/2015           Day 3 vancomycin        Day 3 aztreonam        Day 3 fluconazole  Principal Problem:   Sepsis Active Problems:   IDDM (insulin dependent diabetes mellitus)   Spinal cord infarction (history of)   Acute encephalopathy   UTI (lower urinary tract infection)   Stage IV decubitus ulcer   AKI (acute kidney injury)   Suprapubic catheter   DKA (diabetic ketoacidoses)   Allergy to multiple antibiotics   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   . aztreonam  1 g Intravenous 3 times per day  . baclofen  10 mg Oral BID WC  . baclofen  20 mg Oral QHS  . collagenase   Topical Daily  . enoxaparin (LOVENOX) injection  40 mg Subcutaneous Q24H  . feeding supplement (ENSURE ENLIVE)  237 mL Oral TID BM  . fentaNYL  25 mcg Transdermal Q72H  . fluconazole (DIFLUCAN) IV  200 mg Intravenous Q24H  . insulin aspart  0-20 Units Subcutaneous TID WC  . insulin aspart  0-5 Units Subcutaneous QHS  . insulin glargine  18 Units Subcutaneous QHS  . loratadine  10 mg Oral Daily  . oxyCODONE-acetaminophen  1 tablet Oral TID PC   And  . oxyCODONE  5 mg Oral TID PC  . pantoprazole  40 mg Oral Daily  . potassium chloride SA  20 mEq Oral Daily  . pregabalin  100 mg Oral TID  . pregabalin  200 mg Oral Q2200  . QUEtiapine  25 mg Oral TID  . vancomycin  500 mg Intravenous Q12H     Past Medical History  Diagnosis Date  . GERD (gastroesophageal reflux disease)   . Asthma   . Hx MRSA infection     on face  . Gastroparesis   . Diabetic neuropathy   . Seizures   . Family history of anesthesia complication     Pt mother can't have epidural procedures  . Dysrhythmia   . Pneumonia   . Arthritis   . Fibromyalgia   . 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 Type I diabetes mellitus    sees Dr. Loanne Drilling     History  Substance Use Topics  . Smoking status: Former Smoker -- 12 years    Types: Cigars, Cigarettes    Quit date: 09/07/2014  . Smokeless tobacco: Never Used     Comment: e-cigarette and cigars currently  . Alcohol Use: No     Comment: rarely    Family History  Problem Relation Age of Onset  . Diabetes Father   . Hypertension Father   . Asthma      fhx  . Hypertension      fhx  . Stroke      fhx  . Heart disease Mother    Allergies  Allergen Reactions  . Cefuroxime Axetil Anaphylaxis  . Ertapenem Other (See Comments)    Rash and confusion  . Morphine And Related Other (See Comments)    Changed mental status, confusion, headache, visual hallucination  . Penicillins Anaphylaxis and Other (See Comments)    ?can take amoxicillin?  . Sulfa Antibiotics Anaphylaxis, Shortness Of  Breath and Other (See Comments)  . Tessalon [Benzonatate] Anaphylaxis  . Shellfish Allergy Itching and Other (See Comments)    Took benadryl to alleviate reaction    OBJECTIVE: Blood pressure 98/67, pulse 50, temperature 97.9 F (36.6 C), temperature source Oral, resp. rate 14, height 5\' 8"  (1.727 m), weight 131 lb 6.3 oz (59.6 kg), SpO2 91 %. General: He is currently in radiology for his pelvic MRI. His nurse reports that he is alert and fully oriented now  Lab Results Lab Results  Component Value Date   WBC 13.3* 01/08/2015   HGB 8.2* 01/08/2015   HCT 25.5* 01/08/2015   MCV 83.3 01/08/2015   PLT 660* 01/08/2015    Lab Results  Component Value Date   CREATININE 0.59 01/08/2015   BUN 12 01/08/2015   NA 139 01/08/2015   K 4.0 01/08/2015   CL 117* 01/08/2015   CO2 22 01/08/2015    Lab Results  Component Value Date   ALT 8 01/08/2015   AST 9 01/08/2015   ALKPHOS 72 01/08/2015   BILITOT <0.1* 01/08/2015     Microbiology: Recent Results (from the past 240 hour(s))  Blood culture (routine x 2)     Status: None (Preliminary result)   Collection Time:  01/06/15 10:00 AM  Result Value Ref Range Status   Specimen Description BLOOD LEFT ARM  Final   Special Requests BOTTLES DRAWN AEROBIC AND ANAEROBIC 10ML  Final   Culture   Final           BLOOD CULTURE RECEIVED NO GROWTH TO DATE CULTURE WILL BE HELD FOR 5 DAYS BEFORE ISSUING A FINAL NEGATIVE REPORT Performed at [facility]    Report Status PENDING  Incomplete  Blood culture (routine x 2)     Status: None (Preliminary result)   Collection Time: 01/06/15 10:15 AM  Result Value Ref Range Status   Specimen Description BLOOD LEFT HAND  Final   Special Requests   Final    BOTTLES DRAWN AEROBIC AND ANAEROBIC 10ML AEROBIC 7ML ANAEROBIC   Culture   Final           BLOOD CULTURE RECEIVED NO GROWTH TO DATE CULTURE WILL BE HELD FOR 5 DAYS BEFORE ISSUING A FINAL NEGATIVE REPORT Performed at [facility]    Report Status PENDING  Incomplete  Clostridium Difficile by PCR     Status: None   Collection Time: 01/06/15 10:46 AM  Result Value Ref Range Status   C difficile by pcr NEGATIVE NEGATIVE Final  Urine culture     Status: None (Preliminary result)   Collection Time: 01/06/15 10:56 AM  Result Value Ref Range Status   Specimen Description URINE, CATHETERIZED  Final   Special Requests Normal  Final   Colony Count PENDING  Incomplete   Culture   Final    Culture reincubated for better growth Performed at [facility]    Report Status PENDING  Incomplete    Assessment: He has improved after restarting empiric antibiotics for possible UTI and/or pelvic infection.  Plan: 1. Continue current antibiotics 2. Await results of cultures and MRI  Michel Bickers, MD [facility] for [place] [phone] pager   [phone] cell 01/08/2015, 1:24 PM

## 2015-01-08 NOTE — Progress Notes (Signed)
MEDICATION RELATED CONSULT NOTE - FOLLOW UP  Re:  Vancomycin   Medications:  Anti-infectives    Start     Dose/Rate Route Frequency Ordered Stop   01/07/15 0400  vancomycin (VANCOCIN) 500 mg in sodium chloride 0.9 % 100 mL IVPB     500 mg 100 mL/hr over 60 Minutes Intravenous Every 12 hours 01/06/15 1738     01/06/15 1600  fluconazole (DIFLUCAN) IVPB 200 mg     200 mg 100 mL/hr over 60 Minutes Intravenous Every 24 hours 01/06/15 1521     01/06/15 1600  vancomycin (VANCOCIN) IVPB 1000 mg/200 mL premix     1,000 mg 200 mL/hr over 60 Minutes Intravenous  Once 01/06/15 1521 01/06/15 1722   01/06/15 1515  vancomycin (VANCOCIN) IVPB 1000 mg/200 mL premix  Status:  Discontinued     1,000 mg 200 mL/hr over 60 Minutes Intravenous Every 24 hours 01/06/15 1514 01/06/15 1515   01/06/15 1300  aztreonam (AZACTAM) 1 g in dextrose 5 % 50 mL IVPB     1 g 100 mL/hr over 30 Minutes Intravenous 3 times per day 01/06/15 1227        Assessment: Vancomycin trough ordered, but dose inadvertently hung prior to lab being obtained.  Will re-schedule trough level for AM.  Have informed nurse regarding need for trough to be obtained prior to giving.  Plan:  Vancomycin trough at 03:30AM Adjust dose as needed  Rober Minion, PharmD., MS Clinical Pharmacist Pager:  [phone] Thank you for allowing pharmacy to be part of this patients care team. 01/08/2015,5:03 PM

## 2015-01-08 NOTE — Progress Notes (Signed)
NUTRITION FOLLOW UP/CONSULT  DOCUMENTATION CODES Per approved criteria  -Severe malnutrition in the context of chronic illness   INTERVENTION: Continue Ensure Enlive po TID, each supplement provides 350 kcal and 20 grams of protein (includes HMB). Higher in CHO than Glucerna and may need additional insulin.   Provide nourishment snacks ([place] sandwich, yogurt). Ordered.  NUTRITION DIAGNOSIS: Malnutrition related to chronic illness as evidenced by intake >/= 75% of his needs for >/= 1 month, severe fat depletion, and 6% weight loss x 1 month per mom; ongoing  Goal: Pt to meet >/= 90% of their estimated nutrition needs; progressing  Monitor:  PO intake, supplement acceptance, skin  ASSESSMENT: Pt with hx of type 1 DM, spinal infarct with quadriplegia, and stage IV PU and ischial osteomyelitis, who was admitted with lethargy and fever determined to be sepsis. Pt was recently hospitalized for the same.   RD consulted for wound healing. Meal completion has been 50%. Pt reports he continues to have a decreased appetite. Pt has been consuming his Ensure. Will continue with current orders. Of note, Ensure Enlive is higher in calories and protein and also includes HMB for wound healing when compared to Glucerna. Pt was encouraged to eat his food at meals and to drink his supplements.   Labs and medications reviewed.  Height: Ht Readings from Last 1 Encounters:  01/06/15 5\' 8"  (1.727 m)    Weight: Wt Readings from Last 1 Encounters:  01/06/15 131 lb 6.3 oz (59.6 kg)    BMI:  Body mass index is 19.98 kg/(m^2).  Re-Estimated Nutritional Needs: Kcal: 2100-2300 Protein: 105- 120 grams Fluid: 2.1 - 2.3 L/day  Skin:  Unstageable PU: ischial tuberosity Stage IV PU: ischial tuberosity Stage III PU: left toe Stage IV PU: sacrum Stage IV PU: right ischial tuberosity Stage II PU: left finger Stage II PU: buttocks Stage III PU: left heel  Diet Order: Diet Carb Modified Fluid  consistency:: Thin; Room service appropriate?: Yes   Intake/Output Summary (Last 24 hours) at 01/08/15 1152 Last data filed at 01/08/15 1150  Gross per 24 hour  Intake 3417.5 ml  Output   2925 ml  Net  492.5 ml    Last BM: via colostomy  No output documented yet  Labs:   Recent Labs Lab 01/06/15 1942  01/07/15 0440 01/07/15 1355 01/08/15 0400  NA 133*  < > 138 134* 139  K 4.1  < > 3.3* 3.7 4.0  CL 109  < > 113* 111 117*  CO2 17*  < > 23 20 22   BUN 22  < > 19 16 12   CREATININE 1.45*  < > 1.07 0.81 0.59  CALCIUM 9.4  < > 9.5 9.5 9.7  MG 1.6  --   --   --   --   PHOS 4.5  --   --   --   --   GLUCOSE 469*  < > 108* 223* 88  < > = values in this interval not displayed.  CBG (last 3)   Recent Labs  01/07/15 2124 01/08/15 0800 01/08/15 1146  GLUCAP 124* 56* 73    Scheduled Meds: . aztreonam  1 g Intravenous 3 times per day  . baclofen  10 mg Oral BID WC  . baclofen  20 mg Oral QHS  . collagenase   Topical Daily  . enoxaparin (LOVENOX) injection  40 mg Subcutaneous Q24H  . feeding supplement (ENSURE ENLIVE)  237 mL Oral TID BM  . fentaNYL  25 mcg Transdermal  Q72H  . fluconazole (DIFLUCAN) IV  200 mg Intravenous Q24H  . insulin aspart  0-20 Units Subcutaneous TID WC  . insulin aspart  0-5 Units Subcutaneous QHS  . insulin glargine  18 Units Subcutaneous QHS  . loratadine  10 mg Oral Daily  . oxyCODONE-acetaminophen  1 tablet Oral TID PC   And  . oxyCODONE  5 mg Oral TID PC  . pantoprazole  40 mg Oral Daily  . potassium chloride SA  20 mEq Oral Daily  . pregabalin  100 mg Oral TID  . pregabalin  200 mg Oral Q2200  . QUEtiapine  25 mg Oral TID  . vancomycin  500 mg Intravenous Q12H    Continuous Infusions: . sodium chloride 50 mL/hr at 01/08/15 [address] [place], [place], [facility] Pager # [phone] After hours/ weekend pager # [phone]

## 2015-01-09 DIAGNOSIS — B9689 Other specified bacterial agents as the cause of diseases classified elsewhere: Secondary | ICD-10-CM

## 2015-01-09 DIAGNOSIS — R410 Disorientation, unspecified: Secondary | ICD-10-CM

## 2015-01-09 DIAGNOSIS — G8250 Quadriplegia, unspecified: Secondary | ICD-10-CM

## 2015-01-09 DIAGNOSIS — L8990 Pressure ulcer of unspecified site, unspecified stage: Secondary | ICD-10-CM

## 2015-01-09 DIAGNOSIS — I69865 Other paralytic syndrome following other cerebrovascular disease, bilateral: Secondary | ICD-10-CM

## 2015-01-09 DIAGNOSIS — M868X8 Other osteomyelitis, other site: Secondary | ICD-10-CM

## 2015-01-09 DIAGNOSIS — Z960 Presence of urogenital implants: Secondary | ICD-10-CM

## 2015-01-09 DIAGNOSIS — Z933 Colostomy status: Secondary | ICD-10-CM

## 2015-01-09 LAB — COMPREHENSIVE METABOLIC PANEL
ALT: 8 U/L (ref 0–53)
AST: 11 U/L (ref 0–37)
Albumin: 1.9 g/dL — ABNORMAL LOW (ref 3.5–5.2)
Alkaline Phosphatase: 74 U/L (ref 39–117)
Anion gap: 6 (ref 5–15)
BUN: 10 mg/dL (ref 6–23)
CALCIUM: 9.7 mg/dL (ref 8.4–10.5)
CO2: 19 mmol/L (ref 19–32)
CREATININE: 0.6 mg/dL (ref 0.50–1.35)
Chloride: 113 mmol/L — ABNORMAL HIGH (ref 96–112)
GFR calc Af Amer: >90 mL/min (ref >90)
Glucose, Bld: 125 mg/dL — ABNORMAL HIGH (ref 70–99)
Potassium: 3.9 mmol/L (ref 3.5–5.1)
Sodium: 138 mmol/L (ref 135–145)
Total Bilirubin: 0.3 mg/dL (ref 0.3–1.2)
Total Protein: 6.6 g/dL (ref 6.0–8.3)

## 2015-01-09 LAB — GLUCOSE, CAPILLARY
GLUCOSE-CAPILLARY: 269 mg/dL — AB (ref 70–99)
Glucose-Capillary: 70 mg/dL (ref 70–99)
Glucose-Capillary: 38 mg/dL — CL (ref 70–99)
Glucose-Capillary: 159 mg/dL — ABNORMAL HIGH (ref 70–99)
Glucose-Capillary: 190 mg/dL — ABNORMAL HIGH (ref 70–99)

## 2015-01-09 LAB — CBC
HCT: 26.8 % — ABNORMAL LOW (ref 39.0–52.0)
Hemoglobin: 8.4 g/dL — ABNORMAL LOW (ref 13.0–17.0)
MCH: 26.2 pg (ref 26.0–34.0)
MCHC: 31.3 g/dL (ref 30.0–36.0)
MCV: 83.5 fL (ref 78.0–100.0)
PLATELETS: 599 10*3/uL — AB (ref 150–400)
RBC: 3.21 MIL/uL — ABNORMAL LOW (ref 4.22–5.81)
RDW: 15.7 % — AB (ref 11.5–15.5)
WBC: 17.7 10*3/uL — AB (ref 4.0–10.5)

## 2015-01-09 LAB — URINE CULTURE: SPECIAL REQUESTS: NORMAL

## 2015-01-09 LAB — VANCOMYCIN, TROUGH: Vancomycin Tr: 19 ug/mL (ref 10.0–20.0)

## 2015-01-09 MED ORDER — METRONIDAZOLE IN NACL 5-0.79 MG/ML-% IV SOLN
500.0000 mg | Freq: Three times a day (TID) | INTRAVENOUS | Status: DC
  Administered 2015-01-09 – 2015-01-12 (×9): 500 mg via INTRAVENOUS
  Filled 2015-01-09 (×11): qty 100

## 2015-01-09 MED ORDER — OXYCODONE-ACETAMINOPHEN 5-325 MG PO TABS
1.0000 | ORAL_TABLET | Freq: Once | ORAL | Status: AC
  Administered 2015-01-09: 1 via ORAL
  Filled 2015-01-09: qty 1

## 2015-01-09 MED ORDER — DEXTROSE 50 % IV SOLN
INTRAVENOUS | Status: AC
  Administered 2015-01-09: 50 mL
  Filled 2015-01-09: qty 50

## 2015-01-09 MED ORDER — INSULIN ASPART 100 UNIT/ML ~~LOC~~ SOLN
3.0000 [IU] | Freq: Three times a day (TID) | SUBCUTANEOUS | Status: DC
  Administered 2015-01-09 – 2015-01-10 (×2): 3 [IU] via SUBCUTANEOUS

## 2015-01-09 MED ORDER — FENTANYL CITRATE 0.05 MG/ML IJ SOLN
12.5000 ug | Freq: Four times a day (QID) | INTRAMUSCULAR | Status: DC | PRN
  Administered 2015-01-09 – 2015-01-14 (×6): 12.5 ug via INTRAVENOUS
  Filled 2015-01-09 (×7): qty 2

## 2015-01-09 MED ORDER — INSULIN GLARGINE 100 UNIT/ML ~~LOC~~ SOLN
5.0000 [IU] | Freq: Every day | SUBCUTANEOUS | Status: DC
  Administered 2015-01-09 – 2015-01-11 (×2): 5 [IU] via SUBCUTANEOUS
  Filled 2015-01-09 (×3): qty 0.05

## 2015-01-09 MED ORDER — ALUM & MAG HYDROXIDE-SIMETH 200-200-20 MG/5ML PO SUSP
30.0000 mL | Freq: Four times a day (QID) | ORAL | Status: DC | PRN
  Administered 2015-01-09 – 2015-01-10 (×2): 30 mL via ORAL
  Filled 2015-01-09: qty 30

## 2015-01-09 NOTE — Progress Notes (Signed)
Patient ID: Jason [name], male   DOB: 25-Apr-1986, 29 y.o.   MRN: [id number]         [facility]    Date of Admission:  01/06/2015           Day 4 vancomycin        Day 4 aztreonam        Day 7 fluconazole  Principal Problem:   Sepsis Active Problems:   IDDM (insulin dependent diabetes mellitus)   Spinal cord infarction (history of)   Acute encephalopathy   UTI (lower urinary tract infection)   Stage IV decubitus ulcer   AKI (acute kidney injury)   Suprapubic catheter   DKA (diabetic ketoacidoses)   Allergy to multiple antibiotics   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   . aztreonam  1 g Intravenous 3 times per day  . baclofen  10 mg Oral BID WC  . baclofen  20 mg Oral QHS  . collagenase   Topical Daily  . dextrose      . enoxaparin (LOVENOX) injection  40 mg Subcutaneous Q24H  . feeding supplement (ENSURE ENLIVE)  237 mL Oral TID BM  . fentaNYL  25 mcg Transdermal Q72H  . fluconazole (DIFLUCAN) IV  200 mg Intravenous Q24H  . insulin glargine  5 Units Subcutaneous QHS  . loratadine  10 mg Oral Daily  . oxyCODONE-acetaminophen  1 tablet Oral TID PC   And  . oxyCODONE  5 mg Oral TID PC  . pantoprazole  40 mg Oral Daily  . potassium chloride SA  20 mEq Oral Daily  . pregabalin  100 mg Oral TID  . pregabalin  200 mg Oral Q2200  . QUEtiapine  25 mg Oral TID  . vancomycin  500 mg Intravenous Q12H     Past Medical History  Diagnosis Date  . GERD (gastroesophageal reflux disease)   . Asthma   . Hx MRSA infection     on face  . Gastroparesis   . Diabetic neuropathy   . Seizures   . Family history of anesthesia complication     Pt mother can't have epidural procedures  . Dysrhythmia   . Pneumonia   . Arthritis   . Fibromyalgia   . 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 Type I diabetes mellitus     sees Dr. Loanne Drilling     History  Substance Use Topics  . Smoking status: Former  Smoker -- 12 years    Types: Cigars, Cigarettes    Quit date: 09/07/2014  . Smokeless tobacco: Never Used     Comment: e-cigarette and cigars currently  . Alcohol Use: No     Comment: rarely    Family History  Problem Relation Age of Onset  . Diabetes Father   . Hypertension Father   . Asthma      fhx  . Hypertension      fhx  . Stroke      fhx  . Heart disease Mother    Allergies  Allergen Reactions  . Cefuroxime Axetil Anaphylaxis  . Ertapenem Other (See Comments)    Rash and confusion  . Morphine And Related Other (See Comments)    Changed mental status, confusion, headache, visual hallucination  . Penicillins Anaphylaxis and Other (See Comments)    ?can take amoxicillin?  . Sulfa Antibiotics Anaphylaxis, Shortness Of Breath and Other (See Comments)  . Tessalon [Benzonatate] Anaphylaxis  .  Shellfish Allergy Itching and Other (See Comments)    Took benadryl to alleviate reaction   SUBJECTIVE: He was aware of his fever last night  OBJECTIVE: Blood pressure 110/59, pulse 86, temperature 98.1 F (36.7 C), temperature source Oral, resp. rate 16, height 5\' 8"  (1.727 m), weight 124 lb 1.9 oz (56.3 kg), SpO2 100 %.  MAXIMUM TEMPERATURE 102.6  General: He is watching television with his father. He is in no distress.  Skin: No rash Lungs: Clear Cor: Regular S1 and S2 with no murmurs Abdomen: Soft and nontender. Left lower quadrant colostomy site normal. Suprapubic catheter site normal  Lab Results Lab Results  Component Value Date   WBC 17.7* 01/09/2015   HGB 8.4* 01/09/2015   HCT 26.8* 01/09/2015   MCV 83.5 01/09/2015   PLT 599* 01/09/2015    Lab Results  Component Value Date   CREATININE 0.60 01/09/2015   BUN 10 01/09/2015   NA 138 01/09/2015   K 3.9 01/09/2015   CL 113* 01/09/2015   CO2 19 01/09/2015    Lab Results  Component Value Date   ALT 8 01/09/2015   AST 11 01/09/2015   ALKPHOS 74 01/09/2015   BILITOT 0.3 01/09/2015      Microbiology: Recent Results (from the past 240 hour(s))  Blood culture (routine x 2)     Status: None (Preliminary result)   Collection Time: 01/06/15 10:00 AM  Result Value Ref Range Status   Specimen Description BLOOD LEFT ARM  Final   Special Requests BOTTLES DRAWN AEROBIC AND ANAEROBIC 10ML  Final   Culture   Final           BLOOD CULTURE RECEIVED NO GROWTH TO DATE CULTURE WILL BE HELD FOR 5 DAYS BEFORE ISSUING A FINAL NEGATIVE REPORT Performed at [facility]    Report Status PENDING  Incomplete  Blood culture (routine x 2)     Status: None (Preliminary result)   Collection Time: 01/06/15 10:15 AM  Result Value Ref Range Status   Specimen Description BLOOD LEFT HAND  Final   Special Requests   Final    BOTTLES DRAWN AEROBIC AND ANAEROBIC 10ML AEROBIC 7ML ANAEROBIC   Culture   Final           BLOOD CULTURE RECEIVED NO GROWTH TO DATE CULTURE WILL BE HELD FOR 5 DAYS BEFORE ISSUING A FINAL NEGATIVE REPORT Performed at [facility]    Report Status PENDING  Incomplete  Clostridium Difficile by PCR     Status: None   Collection Time: 01/06/15 10:46 AM  Result Value Ref Range Status   C difficile by pcr NEGATIVE NEGATIVE Final  Stool culture     Status: None (Preliminary result)   Collection Time: 01/06/15 10:46 AM  Result Value Ref Range Status   Specimen Description STOOL  Final   Special Requests Immunocompromised  Final   Culture   Final    Culture reincubated for better growth Performed at [facility]    Report Status PENDING  Incomplete  Urine culture     Status: None   Collection Time: 01/06/15 10:56 AM  Result Value Ref Range Status   Specimen Description URINE, CATHETERIZED  Final   Special Requests Normal  Final   Colony Count   Final    >=100,000 COLONIES/ML Performed at [facility]    Culture YEAST Performed at [facility]   Final   Report Status 01/09/2015 FINAL  Final   MRI of the pelvis  01/08/2015    IMPRESSION: 1. Persistent large decubitus ulceration over the right ischial tuberosity, extending to the cortex. Underlying osteomyelitis within the ischium has mildly progressed compared with the MRI of 2 months ago. 2. No residual focal soft tissue abscess. 3. Generalized muscular edema and enhancement are similar to the prior examination. The subcutaneous edema has improved. 4. No significant hip findings.   Electronically Signed  By: Richardean Sale M.D.  On: 01/08/2015 15:02  Assessment: His confusion resolved promptly after restarting broad empiric antibiotic therapy. I'm not sure what is causing the confusion or fever. There are no new findings on his MRI and he is already received a long course of therapy for pelvic osteomyelitis. I spoke with his mother by phone today and she felt strongly that he should be back on metronidazole and I will restart it. I will also continue fluconazole for possible fungal UTI.  Plan: 1. Continue current antibiotics and restart metronidazole  Michel Bickers, MD [facility] for Infectious [place] [phone] pager   [phone] cell 01/09/2015, 1:05 PM

## 2015-01-09 NOTE — Progress Notes (Signed)
MEDICATION RELATED CONSULT NOTE - FOLLOW UP  Re:  Vancomycin   Medications:  Anti-infectives    Start     Dose/Rate Route Frequency Ordered Stop   01/07/15 0400  vancomycin (VANCOCIN) 500 mg in sodium chloride 0.9 % 100 mL IVPB     500 mg 100 mL/hr over 60 Minutes Intravenous Every 12 hours 01/06/15 1738     01/06/15 1600  fluconazole (DIFLUCAN) IVPB 200 mg     200 mg 100 mL/hr over 60 Minutes Intravenous Every 24 hours 01/06/15 1521     01/06/15 1600  vancomycin (VANCOCIN) IVPB 1000 mg/200 mL premix     1,000 mg 200 mL/hr over 60 Minutes Intravenous  Once 01/06/15 1521 01/06/15 1722   01/06/15 1515  vancomycin (VANCOCIN) IVPB 1000 mg/200 mL premix  Status:  Discontinued     1,000 mg 200 mL/hr over 60 Minutes Intravenous Every 24 hours 01/06/15 1514 01/06/15 1515   01/06/15 1300  aztreonam (AZACTAM) 1 g in dextrose 5 % 50 mL IVPB     1 g 100 mL/hr over 30 Minutes Intravenous 3 times per day 01/06/15 1227        Assessment: Vancomycin trough ordered, but dose inadvertently hung prior to lab being obtained.  Will re-schedule trough level for AM.  Have informed nurse regarding need for trough to be obtained prior to giving.  VT is therapeutic at 19 on vancomycin 1g IV q8h.  Plan:  Continue vancomycin 1g IV q8h Monitor cultures, renal function, and clinical course VT if changes in clinical course or renal function  Andrey Cota. Diona Foley, PharmD Clinical Pharmacist Pager [phone] 01/09/2015,4:32 AM

## 2015-01-09 NOTE — Progress Notes (Addendum)
Progress Note  Jason [name] TKZ:[id number] DOB: 1986-08-03 DOA: 01/06/2015 PCP: [name], MD  Admit HPI / Brief Narrative: 29 y.o. 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 Patient was discharged from the hospital on 12/01/2014 after being treated for severe sepsis, aspiration pneumonia, and multiple sacral decubitus ulcers with aztreonam, vancomycin and flagyl. IV antibiotics were finished on 3/27 and according to his mother since then he had been getting progressively worse, w/ fever of 101.5, lethargic and not eating.  In the ED patient received Narcan without significant change, his mother reported that he did not get any extra pain medications, he was hypothermic with temperature of 96.5, his WBC was 18.8, patient admitted to stepdown for further evaluation.  Assessment/Plan:  Sepsis likely due to infected sacral decubs +/- UTI  Fever spike this am. bc neg to date. Urine cx with yeast.  abx per ID  Stage IV sacral decubitus ulcer with ischial ulcers w/ osteomyelitis - various other skin wounds Patient was recently getting treated for ischial osteomyelitis with vancomycin, aztreonam and Flagyl - IV antibiotics finished on 01/02/2015 - MRI shows Underlying osteomyelitis within the ischium has mildly progressed compared with the MRI of 2 months ago. 2. No residual focal soft tissue abscess. 3. Generalized muscular edema and enhancement are similar to the prior examination. The subcutaneous edema has improved. 4. No significant hip findings.  UTI w/ indwelling suprapubic catheter / neurogenic bladder Urine culture with 100K yeast  Early DKA - uncontrolled DM1 Resolved. Now with hypoglycemia into the 30s. Will decrease lantus and novolog  Chronic hypotension On midodrine - stopped all other BP meds - BP currently stable    Acute encephalopathy  Likely secondary to sepsis - resolved rapidly and now at baseline mental status   History of a spinal cord infarction w/ quadriplegia S/p diverting colostomy and suprapubic urinary cath   Acute kidney injury Baseline creatinine is 0.7 - creatinine at presentation 1.73 - crt has normalized w/ volume expansion   Chronic anemia Follow Hgb trend - stable at present   Severe Protein calorie malnutrition  Code Status: FULL Family Communication: no family present at time of exam today   Disposition Plan:   Consultants: ID  Procedures: none  Antibiotics: Aztreonam 3/31 > Vanc 3/31 > Diflucan 4/01 >  HPI/Subjective: C/o pain in back, bottom and shoulders, asking for "fentanyl push"  Objective: Blood pressure 110/59, pulse 86, temperature 98.1 F (36.7 C), temperature source Oral, resp. rate 16, height 5\' 8"  (1.727 m), weight 56.3 kg (124 lb 1.9 oz), SpO2 100 %.  Intake/Output Summary (Last 24 hours) at 01/09/15 1507 Last data filed at 01/09/15 0945  Gross per 24 hour  Intake   1080 ml  Output   1100 ml  Net    -20 ml   Exam: General: No acute respiratory distress - alert and conversant  Lungs: Clear to auscultation bilaterally without wheezes or crackles Cardiovascular: Regular rate and rhythm without murmur gallop or rub  Abdomen: Nontender, nondistended, soft, bowel sounds positive, no rebound Extremities: No significant cyanosis, clubbing, or edema bilateral lower extremities Wounds:  Will examine tomorrow  Data Reviewed: Basic Metabolic Panel:  Recent Labs Lab 01/06/15 1942  01/07/15 0109 01/07/15 0440 01/07/15 1355 01/08/15 0400 01/09/15 0342  NA 133*  < > 138 138 134* 139 138  K 4.1  < >  3.4* 3.3* 3.7 4.0 3.9  CL 109  < > 112 113* 111 117* 113*  CO2 17*  < > 17* 23 20 22 19   GLUCOSE 469*  < > 243* 108* 223* 88 125*  BUN 22  < > 22 19 16 12 10   CREATININE 1.45*  < > 1.33 1.07 0.81 0.59 0.60  CALCIUM 9.4  < > 9.7 9.5 9.5 9.7  9.7  MG 1.6  --   --   --   --   --   --   PHOS 4.5  --   --   --   --   --   --   < > = values in this interval not displayed. Liver Function Tests:  Recent Labs Lab 01/06/15 1015 01/08/15 0400 01/09/15 0342  AST 8 9 11   ALT 10 8 8   ALKPHOS 93 72 74  BILITOT 0.5 <0.1* 0.3  PROT 7.3 6.5 6.6  ALBUMIN 2.2* 1.9* 1.9*    Recent Labs Lab 01/06/15 1015  AMMONIA 39*   CBC:  Recent Labs Lab 01/06/15 1015 01/08/15 0400 01/09/15 0342  WBC 18.8* 13.3* 17.7*  HGB 8.1* 8.2* 8.4*  HCT 26.8* 25.5* 26.8*  MCV 86.5 83.3 83.5  PLT 608* 660* 599*    CBG:  Recent Labs Lab 01/08/15 2216 01/09/15 0748 01/09/15 1105 01/09/15 1106 01/09/15 1157  GLUCAP 182* 70 33* 38* 159*    Recent Results (from the past 240 hour(s))  Blood culture (routine x 2)     Status: None (Preliminary result)   Collection Time: 01/06/15 10:00 AM  Result Value Ref Range Status   Specimen Description BLOOD LEFT ARM  Final   Special Requests BOTTLES DRAWN AEROBIC AND ANAEROBIC 10ML  Final   Culture   Final           BLOOD CULTURE RECEIVED NO GROWTH TO DATE CULTURE WILL BE HELD FOR 5 DAYS BEFORE ISSUING A FINAL NEGATIVE REPORT Performed at [facility]    Report Status PENDING  Incomplete  Blood culture (routine x 2)     Status: None (Preliminary result)   Collection Time: 01/06/15 10:15 AM  Result Value Ref Range Status   Specimen Description BLOOD LEFT HAND  Final   Special Requests   Final    BOTTLES DRAWN AEROBIC AND ANAEROBIC 10ML AEROBIC 7ML ANAEROBIC   Culture   Final           BLOOD CULTURE RECEIVED NO GROWTH TO DATE CULTURE WILL BE HELD FOR 5 DAYS BEFORE ISSUING A FINAL NEGATIVE REPORT Performed at [facility]    Report Status PENDING  Incomplete  Clostridium Difficile by PCR     Status: None   Collection Time: 01/06/15 10:46 AM  Result Value Ref Range Status   C difficile by pcr NEGATIVE NEGATIVE Final  Stool culture     Status: None (Preliminary result)   Collection  Time: 01/06/15 10:46 AM  Result Value Ref Range Status   Specimen Description STOOL  Final   Special Requests Immunocompromised  Final   Culture   Final    Culture reincubated for better growth Performed at [facility]    Report Status PENDING  Incomplete  Urine culture     Status: None   Collection Time: 01/06/15 10:56 AM  Result Value Ref Range Status   Specimen Description URINE, CATHETERIZED  Final   Special Requests Normal  Final   Colony Count   Final    >=100,000 COLONIES/ML  Performed at [place] Performed at [facility]   Final   Report Status 01/09/2015 FINAL  Final     Studies:  Recent x-ray studies have been reviewed in detail by the Attending Physician  Scheduled Meds:  Scheduled Meds: . aztreonam  1 g Intravenous 3 times per day  . baclofen  10 mg Oral BID WC  . baclofen  20 mg Oral QHS  . collagenase   Topical Daily  . dextrose      . enoxaparin (LOVENOX) injection  40 mg Subcutaneous Q24H  . feeding supplement (ENSURE ENLIVE)  237 mL Oral TID BM  . fentaNYL  25 mcg Transdermal Q72H  . fluconazole (DIFLUCAN) IV  200 mg Intravenous Q24H  . insulin aspart  3 Units Subcutaneous TID WC  . insulin glargine  5 Units Subcutaneous QHS  . loratadine  10 mg Oral Daily  . metronidazole  500 mg Intravenous Q8H  . oxyCODONE-acetaminophen  1 tablet Oral TID PC   And  . oxyCODONE  5 mg Oral TID PC  . pantoprazole  40 mg Oral Daily  . potassium chloride SA  20 mEq Oral Daily  . pregabalin  100 mg Oral TID  . pregabalin  200 mg Oral Q2200  . QUEtiapine  25 mg Oral TID  . vancomycin  500 mg Intravenous Q12H    Time spent on care of this patient: 25 mins   Delfina Redwood , MD  [facility]  [email or web address] Password [email or web address] 01/09/2015, 3:07 PM   LOS: 3 days

## 2015-01-09 NOTE — Progress Notes (Addendum)
ANTIBIOTIC CONSULT NOTE - FOLLOW UP  Pharmacy Consult for Aztreonam, Vancomycin, Fluconazole Indication: Hx of sepsis, aspiration PNA, sacral decubital/ischial osteomyselitis, R/O UTI/Yeast  Allergies  Allergen Reactions  . Cefuroxime Axetil Anaphylaxis  . Ertapenem Other (See Comments)    Rash and confusion  . Morphine And Related Other (See Comments)    Changed mental status, confusion, headache, visual hallucination  . Penicillins Anaphylaxis and Other (See Comments)    ?can take amoxicillin?  . Sulfa Antibiotics Anaphylaxis, Shortness Of Breath and Other (See Comments)  . Tessalon [Benzonatate] Anaphylaxis  . Shellfish Allergy Itching and Other (See Comments)    Took benadryl to alleviate reaction    Patient Measurements: Height: 5\' 8"  (172.7 cm) Weight: 124 lb 1.9 oz (56.3 kg) IBW/kg (Calculated) : 68.4  Vital Signs: Temp: 102.6 F (39.2 C) (04/03 0459) Temp Source: Oral (04/03 0459) BP: 108/68 mmHg (04/03 0459) Pulse Rate: 89 (04/03 0459) Intake/Output from previous day: 04/02 0701 - 04/03 0700 In: 1822.5 [P.O.:960; I.V.:812.5; IV Piggyback:50] Out: 2300 [Urine:2300] Intake/Output from this shift:    Labs:  Recent Labs  01/06/15 1015  01/07/15 1355 01/08/15 0400 01/09/15 0342  WBC 18.8*  --   --  13.3* 17.7*  HGB 8.1*  --   --  8.2* 8.4*  PLT 608*  --   --  660* 599*  CREATININE 1.73*  < > 0.81 0.59 0.60  < > = values in this interval not displayed. Estimated Creatinine Clearance: 107.5 mL/min (by C-G formula based on Cr of 0.6).  Recent Labs  01/09/15 0342  VANCOTROUGH 19.0     Microbiology: Recent Results (from the past 720 hour(s))  Culture, blood (routine x 2)     Status: None   Collection Time: 12/10/14  4:55 PM  Result Value Ref Range Status   Specimen Description BLOOD  Final   Special Requests NONE  Final   Culture   Final    NO GROWTH 5 DAYS Performed at [facility]    Report Status 12/17/2014 FINAL  Final  Culture, blood  (routine x 2)     Status: None   Collection Time: 12/10/14  5:00 PM  Result Value Ref Range Status   Specimen Description BLOOD LEFT ARM  Final   Special Requests BOTTLES DRAWN AEROBIC ONLY 1 CC  Final   Culture   Final    NO GROWTH 5 DAYS Performed at [facility]    Report Status 12/17/2014 FINAL  Final  Urine culture     Status: None   Collection Time: 12/10/14  6:11 PM  Result Value Ref Range Status   Specimen Description URINE, CATHETERIZED  Final   Special Requests NONE  Final   Colony Count NO GROWTH Performed at [facility]   Final   Culture NO GROWTH Performed at [facility]   Final   Report Status 12/12/2014 FINAL  Final  Clostridium Difficile by PCR     Status: None   Collection Time: 12/12/14  4:22 AM  Result Value Ref Range Status   C difficile by pcr NEGATIVE NEGATIVE Final  MRSA PCR Screening     Status: None   Collection Time: 12/12/14  3:27 PM  Result Value Ref Range Status   MRSA by PCR NEGATIVE NEGATIVE Final    Comment:        The GeneXpert MRSA Assay (FDA approved for NASAL specimens only), is one component of a comprehensive MRSA colonization surveillance program. It is not intended to  diagnose MRSA infection nor to guide or monitor treatment for MRSA infections.   Blood culture (routine x 2)     Status: None (Preliminary result)   Collection Time: 01/06/15 10:00 AM  Result Value Ref Range Status   Specimen Description BLOOD LEFT ARM  Final   Special Requests BOTTLES DRAWN AEROBIC AND ANAEROBIC 10ML  Final   Culture   Final           BLOOD CULTURE RECEIVED NO GROWTH TO DATE CULTURE WILL BE HELD FOR 5 DAYS BEFORE ISSUING A FINAL NEGATIVE REPORT Performed at [facility]    Report Status PENDING  Incomplete  Blood culture (routine x 2)     Status: None (Preliminary result)   Collection Time: 01/06/15 10:15 AM  Result Value Ref Range Status   Specimen Description BLOOD LEFT HAND  Final   Special Requests    Final    BOTTLES DRAWN AEROBIC AND ANAEROBIC 10ML AEROBIC 7ML ANAEROBIC   Culture   Final           BLOOD CULTURE RECEIVED NO GROWTH TO DATE CULTURE WILL BE HELD FOR 5 DAYS BEFORE ISSUING A FINAL NEGATIVE REPORT Performed at [facility]    Report Status PENDING  Incomplete  Clostridium Difficile by PCR     Status: None   Collection Time: 01/06/15 10:46 AM  Result Value Ref Range Status   C difficile by pcr NEGATIVE NEGATIVE Final  Stool culture     Status: None (Preliminary result)   Collection Time: 01/06/15 10:46 AM  Result Value Ref Range Status   Specimen Description STOOL  Final   Special Requests Immunocompromised  Final   Culture   Final    Culture reincubated for better growth Performed at [facility]    Report Status PENDING  Incomplete  Urine culture     Status: None   Collection Time: 01/06/15 10:56 AM  Result Value Ref Range Status   Specimen Description URINE, CATHETERIZED  Final   Special Requests Normal  Final   Colony Count   Final    >=100,000 COLONIES/ML Performed at [place] Performed at [facility]   Final   Report Status 01/09/2015 FINAL  Final    Anti-infectives    Start     Dose/Rate Route Frequency Ordered Stop   01/07/15 0400  vancomycin (VANCOCIN) 500 mg in sodium chloride 0.9 % 100 mL IVPB     500 mg 100 mL/hr over 60 Minutes Intravenous Every 12 hours 01/06/15 1738     01/06/15 1600  fluconazole (DIFLUCAN) IVPB 200 mg     200 mg 100 mL/hr over 60 Minutes Intravenous Every 24 hours 01/06/15 1521     01/06/15 1600  vancomycin (VANCOCIN) IVPB 1000 mg/200 mL premix     1,000 mg 200 mL/hr over 60 Minutes Intravenous  Once 01/06/15 1521 01/06/15 1722   01/06/15 1515  vancomycin (VANCOCIN) IVPB 1000 mg/200 mL premix  Status:  Discontinued     1,000 mg 200 mL/hr over 60 Minutes Intravenous Every 24 hours 01/06/15 1514 01/06/15 1515   01/06/15 1300  aztreonam (AZACTAM) 1 g in dextrose 5 % 50  mL IVPB     1 g 100 mL/hr over 30 Minutes Intravenous 3 times per day 01/06/15 1227        Assessment: 2 yoM with spastic quadriplegia continuing on Aztreonam, Vancomycin, and Fluconazole for possible UTI and/or pelvic infection. Patient is febrile (Tmax 102.6),  WBC 17.7. SCr remains stable at 0.60.  Vancomycin trough was therapeutic at 19.   Azactam 3/18>>3/27 (pta); resume 3/31>> Flucon IV 3/31>> Vanc 3/18>>3/27  (pta)>> resume 3/31>> (4/3 VT 19) Santyl as pta  3/31 blood x 2 - ngtd 3/31 C diff NEG 3/31 urine - yeast 3/31 Stool - IP  Goal of Therapy:  Vancomycin trough level 15-20 mcg/ml  Plan:  -Continue Aztreonam 1 Gm IV q8h -Continue Vancomycin 500 mg IV q12h -Continue Fluconazole 200 mg IV q24h -Monitor blood cultures and sensitivities, renal function, and check trough if indicated. -Follow-up ID's recommendations  Theron Arista, PharmD Clinical Pharmacist - Resident Pager: [phone] 4/3/20169:14 AM

## 2015-01-09 NOTE — Progress Notes (Signed)
Hypoglycemic Event  CBG:  33 Treatment  50% dextrose 25 ml  Symptoms: none  Follow-up CBG: Time:  1157  CBG Result:  157  Possible Reasons for Event:  Poor p.o. intake  Comments/MD notified: yes    Joycelyn Liska, Zenon Mayo  Remember to initiate Hypoglycemia Order Set & complete

## 2015-01-10 ENCOUNTER — Inpatient Hospital Stay (HOSPITAL_COMMUNITY): Payer: Medicaid Other

## 2015-01-10 DIAGNOSIS — E872 Acidosis, unspecified: Secondary | ICD-10-CM | POA: Insufficient documentation

## 2015-01-10 DIAGNOSIS — L97429 Non-pressure chronic ulcer of left heel and midfoot with unspecified severity: Secondary | ICD-10-CM

## 2015-01-10 DIAGNOSIS — R509 Fever, unspecified: Secondary | ICD-10-CM

## 2015-01-10 DIAGNOSIS — L97419 Non-pressure chronic ulcer of right heel and midfoot with unspecified severity: Secondary | ICD-10-CM

## 2015-01-10 DIAGNOSIS — E11649 Type 2 diabetes mellitus with hypoglycemia without coma: Secondary | ICD-10-CM

## 2015-01-10 DIAGNOSIS — E11621 Type 2 diabetes mellitus with foot ulcer: Secondary | ICD-10-CM

## 2015-01-10 LAB — GLUCOSE, CAPILLARY
GLUCOSE-CAPILLARY: 212 mg/dL — AB (ref 70–99)
Glucose-Capillary: 33 mg/dL — CL (ref 70–99)
Glucose-Capillary: 139 mg/dL — ABNORMAL HIGH (ref 70–99)
Glucose-Capillary: 74 mg/dL (ref 70–99)

## 2015-01-10 LAB — STOOL CULTURE

## 2015-01-10 LAB — HEMOGLOBIN A1C
HEMOGLOBIN A1C: 8.1 % — AB (ref 4.8–5.6)
MEAN PLASMA GLUCOSE: 186 mg/dL

## 2015-01-10 MED ORDER — INSULIN ASPART 100 UNIT/ML ~~LOC~~ SOLN
2.0000 [IU] | Freq: Three times a day (TID) | SUBCUTANEOUS | Status: DC
Start: 2015-01-10 — End: 2015-01-11
  Administered 2015-01-10: 3 [IU] via SUBCUTANEOUS
  Administered 2015-01-11 (×2): 2 [IU] via SUBCUTANEOUS

## 2015-01-10 NOTE — Clinical Social Work Note (Signed)
CSW talked with patient's mother at the bedside (at her request). Ms. Jason [name] is requesting assistance in getting physical therapy at home for patient. She explained that at one time patient was going to outpatient rehab, but at this time he is very weak and deconditioned and needs assistance with his ADL's. Ms. Jason [name] is aware of the limitations of Medicaid and therapy, however CSW informed her that case management will be made aware.  Kasey Hansell Givens, MSW, LCSW Licensed Clinical Social Worker [place] [phone]

## 2015-01-10 NOTE — Telephone Encounter (Signed)
Pt will need a referral, he does not have a pulmonologist.

## 2015-01-10 NOTE — Consult Note (Signed)
Consult for: Neurogenic bladder, suprapubic tube change Requested by: Dr. Conley Canal  History of Present Illness: Patient admitted with sepsis. Urine culture grew yeast and ID recommended suprapubic tube change to decrease colonization and provide a clean tube while he is on antifungal therapy. Patient had a bump in his creatinine but it has improved and is now normal. he has not undergone upper tract imaging but did undergo an MRI of the pelvis which showed a normal bladder and suprapubic tube in good position. Urology PA Debbrah Alar came by this afternoon and placed a new 18 French suprapubic tube without difficulty. I discussed the patient with Jason [name]. Per the patient the new suprapubic tube has been draining well and the urine is clear.     Past Medical History  Diagnosis Date  . GERD (gastroesophageal reflux disease)   . Asthma   . Hx MRSA infection     on face  . Gastroparesis   . Diabetic neuropathy   . Seizures   . Family history of anesthesia complication     Pt mother can't have epidural procedures  . Dysrhythmia   . Pneumonia   . Arthritis   . Fibromyalgia   . 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 Type I diabetes mellitus     sees Dr. Loanne Drilling    Past Surgical History  Procedure Laterality Date  . Tonsillectomy    . Multiple extractions with alveoloplasty N/A 08/03/2014    Procedure: MULTIPLE EXTRACTIONS;  Surgeon: Gae Bon, DDS;  Location: [place];  Service: Oral Surgery;  Laterality: N/A;  . Tee without cardioversion N/A 08/17/2014    Procedure: TRANSESOPHAGEAL ECHOCARDIOGRAM (TEE);  Surgeon: Dorothy Spark, MD;  Location: [facility] ENDOSCOPY;  Service: Cardiovascular;  Laterality: N/A;  . Debridment of decubitus ulcer N/A 10/04/2014    Procedure: DEBRIDMENT OF DECUBITUS ULCER;  Surgeon: Georganna Skeans, MD;  Location: [place];  Service: General;  Laterality: N/A;  . Laparoscopic diverted colostomy N/A 10/12/2014    Procedure: LAPAROSCOPIC DIVERTING  COLOSTOMY;  Surgeon: Donnie Mesa, MD;  Location: [place];  Service: General;  Laterality: N/A;  . Insertion of suprapubic catheter N/A 10/12/2014    Procedure: INSERTION OF SUPRAPUBIC CATHETER;  Surgeon: Reece Packer, MD;  Location: [place];  Service: Urology;  Laterality: N/A;  . Incision and drainage of wound N/A 11/12/2014    Procedure: IRRIGATION AND DEBRIDEMENT OF WOUNDS WITH BONE BIOPSY AND SURGICAL PREP ;  Surgeon: Theodoro Kos, DO;  Location: [place];  Service: Plastics;  Laterality: N/A;  . Application of a-cell of back N/A 11/12/2014    Procedure: APPLICATION A CELL AND VAC ;  Surgeon: Theodoro Kos, DO;  Location: [place];  Service: Plastics;  Laterality: N/A;  . Incision and drainage of wound N/A 11/18/2014    Procedure: IRRIGATION AND DEBRIDEMENT OF SACRAL ULCER ONLY WITH PLACEMENT OF A CELL AND VAC/ DRESSING CHANGE TO UPPER BACK AREA.;  Surgeon: Theodoro Kos, DO;  Location: [place];  Service: Plastics;  Laterality: N/A;  . Incision and drainage of wound N/A 11/25/2014    Procedure: IRRIGATION AND DEBRIDEMENT OF SACRAL ULCER AND BACK BURN WITH PLACEMENT OF A-CELL;  Surgeon: Theodoro Kos, DO;  Location: [place];  Service: Plastics;  Laterality: N/A;  . Minor application of wound vac N/A 11/25/2014    Procedure:  WOUND VAC CHANGE;  Surgeon: Theodoro Kos, DO;  Location: [place];  Service: Plastics;  Laterality: N/A;  . Incision and drainage of wound Right  12/08/2014    Procedure: IRRIGATION AND DEBRIDEMENT SACRAL WOUND AND RIGHT ISCHIAL WOUND ;  Surgeon: Theodoro Kos, DO;  Location: [place];  Service: Plastics;  Laterality: Right;  . Application of a-cell of back N/A 12/08/2014    Procedure: APPLICATION OF A-CELL AND WOUND VAC ;  Surgeon: Theodoro Kos, DO;  Location: [place];  Service: Plastics;  Laterality: N/A;    Home Medications:  Prescriptions prior to admission  Medication Sig Dispense Refill Last Dose  . acetaminophen (TYLENOL) 500 MG tablet Take 1,000 mg by mouth every 6 (six) hours as needed  for headache.   01/05/2015 at Unknown time  . albuterol (PROVENTIL) (2.5 MG/3ML) 0.083% nebulizer solution Take 3 mLs (2.5 mg total) by nebulization every 4 (four) hours as needed for wheezing or shortness of breath. 75 mL 3 01/05/2015 at Unknown time  . atorvastatin (LIPITOR) 20 MG tablet Take 20 mg by mouth daily.   Past Month at Unknown time  . aztreonam 1 g in sodium chloride 0.9 % 50 mL Inject 1 g into the vein every 8 (eight) hours. 1 ampule 0 01/05/2015 at Unknown time  . baclofen (LIORESAL) 10 MG tablet Take 1-2 tablets (10-20 mg total) by mouth 3 (three) times daily. Takes 10mg  in morning and lunchtime and takes 20mg  at bedtime 30 each 0 01/05/2015 at Unknown time  . dexlansoprazole (DEXILANT) 60 MG capsule Take 1 capsule (60 mg total) by mouth daily. 30 capsule 0 01/05/2015 at Unknown time  . diclofenac sodium (VOLTAREN) 1 % GEL Apply 2 g topically daily as needed (for pain). 100 g 0 01/05/2015 at Unknown time  . diphenoxylate-atropine (LOMOTIL) 2.5-0.025 MG per tablet Take 1 tablet by mouth 4 (four) times daily as needed for diarrhea or loose stools.   01/05/2015 at Unknown time  . feeding supplement, GLUCERNA SHAKE, (GLUCERNA SHAKE) LIQD Take 237 mLs by mouth 3 (three) times daily between meals. (Patient taking differently: Take 237 mLs by mouth 3 (three) times daily with meals. ) 237 mL 90 01/05/2015 at Unknown time  . feeding supplement, GLUCERNA SHAKE, (GLUCERNA SHAKE) LIQD Take 237 mLs by mouth 3 (three) times daily between meals. 30 Can 0 01/05/2015 at [place]                                      own time  . fentaNYL (DURAGESIC - DOSED MCG/HR) 25 MCG/HR patch Place 1 patch (25 mcg total) onto the skin every 3 (three) days. 10 patch 0 01/06/2015 at Unknown time  . glucagon (GLUCAGON EMERGENCY) 1 MG injection Inject 1 mg into the vein once as needed. (Patient taking differently: Inject 1 mg into the vein once as needed (hypoglycemia). ) 1 each 12 Past Month at Unknown time  . insulin aspart (NOVOLOG)  100 UNIT/ML injection Inject 4 Units into the skin 3 (three) times daily before meals. (Patient taking differently: Inject 5 Units into the skin 4 (four) times daily. ) 10 mL 0 01/05/2015 at Unknown time  . insulin glargine (LANTUS) 100 UNIT/ML injection Inject 0.28 mLs (28 Units total) into the skin at bedtime. 10 mL 0 01/05/2015 at Unknown time  . lidocaine (XYLOCAINE) 2 % jelly Apply 1 application topically 3 (three) times a week.   01/05/2015 at Unknown time  . lidocaine (XYLOCAINE) 5 % ointment Apply 1 application topically as needed (mouth sores).   01/05/2015 at Unknown time  . loratadine (CLARITIN) 10  MG tablet Take 10 mg by mouth daily.    01/05/2015 at Unknown time  . metroNIDAZOLE (FLAGYL) 500 MG tablet Take 1 tablet (500 mg total) by mouth 3 (three) times daily. 30 tablet 0 01/05/2015 at Unknown time  . midodrine (PROAMATINE) 5 MG tablet Take 5 mg by mouth as needed (Systolic blood pressure <63).   Past Month at Unknown time  . ondansetron (ZOFRAN ODT) 8 MG disintegrating tablet Take 1 tablet (8 mg total) by mouth every 8 (eight) hours as needed for nausea or vomiting. 60 tablet 5 Past Week at Unknown time  . oxyCODONE-acetaminophen (PERCOCET) 10-325 MG per tablet Take 1 tablet by mouth every 8 (eight) hours. 90 tablet 0 01/05/2015 at Unknown time  . potassium chloride SA (KLOR-CON M20) 20 MEQ tablet Take 1 tablet (20 mEq total) by mouth daily. 90 tablet 3 Past Week at Unknown time  . pregabalin (LYRICA) 100 MG capsule Take 1-2 capsules (100-200 mg total) by mouth 4 (four) times daily. Take 100 mg by mouth in the morning, take 100 mg by mouth in the afternoon, take 100 mg by mouth in the evening and take 200 mg by mouth at bedtime. 150 capsule 4 01/05/2015 at Unknown time  . QUEtiapine (SEROQUEL) 25 MG tablet Take 1 tablet (25 mg total) by mouth 3 (three) times daily. 90 tablet 0 01/05/2015 at Unknown time  . sodium chloride (OCEAN) 0.65 % SOLN nasal spray Place 1 spray into both nostrils as needed  for congestion.   Past Week at unk  . vancomycin (VANCOCIN) 1 GM/200ML SOLN Inject 250 mLs (1,250 mg total) into the vein daily. 7000 mL 0 Past Week at Unknown time  . collagenase (SANTYL) ointment Apply 1 application topically daily. 90 g 5 Taking  . liver oil-zinc oxide (DESITIN) 40 % ointment Apply topically 2 (two) times daily. 56.7 g 0 Taking  . pregabalin (LYRICA) 200 MG capsule Take 1 capsule (200 mg total) by mouth at bedtime. 30 capsule 0    Allergies:  Allergies  Allergen Reactions  . Cefuroxime Axetil Anaphylaxis  . Ertapenem Other (See Comments)    Rash and confusion  . Morphine And Related Other (See Comments)    Changed mental status, confusion, headache, visual hallucination  . Penicillins Anaphylaxis and Other (See Comments)    ?can take amoxicillin?  . Sulfa Antibiotics Anaphylaxis, Shortness Of Breath and Other (See Comments)  . Tessalon [Benzonatate] Anaphylaxis  . Shellfish Allergy Itching and Other (See Comments)    Took benadryl to alleviate reaction    Family History  Problem Relation Age of Onset  . Diabetes Father   . Hypertension Father   . Asthma      fhx  . Hypertension      fhx  . Stroke      fhx  . Heart disease Mother    Social History:  reports that he quit smoking about 4 months ago. His smoking use included Cigars and Cigarettes. He quit after 12 years of use. He has never used smokeless tobacco. He reports that he does not drink alcohol or use illicit drugs.  ROS: A complete review of systems was performed.  All systems are negative except for pertinent findings as noted. ROS   Physical Exam:  Vital signs in last 24 hours: Temp:  [98.9 F (37.2 C)-102.7 F (39.3 C)] 99.3 F (37.4 C) (04/04 2114) Pulse Rate:  [78-95] 81 (04/04 2114) Resp:  [16-17] 16 (04/04 2114) BP: (100-131)/(65-81) 106/65 mmHg (04/04 2114) SpO2:  [  97 %-99 %] 98 % (04/04 2114) General:  Alert and oriented, No acute distress HEENT: Normocephalic, atraumatic Neck:  No JVD or lymphadenopathy Cardiovascular: Regular rate and rhythm Lungs: Regular rate and effort Abdomen: Soft, nontender, nondistended, no abdominal masses. There is a new, clean 18 French suprapubic tube draining clear urine. The wrapper is still on the catheter and tubing. The balloon is mobile in the bladder and comes to rest against the bladder/abdominal wall normally.    Laboratory Data:  Results for orders placed or performed during the hospital encounter of 01/06/15 (from the past 24 hour(s))  Glucose, capillary     Status: Abnormal   Collection Time: 01/10/15  7:33 AM  Result Value Ref Range   Glucose-Capillary 139 (H) 70 - 99 mg/dL  Glucose, capillary     Status: None   Collection Time: 01/10/15 11:29 AM  Result Value Ref Range   Glucose-Capillary 74 70 - 99 mg/dL  Glucose, capillary     Status: Abnormal   Collection Time: 01/10/15  3:44 PM  Result Value Ref Range   Glucose-Capillary 212 (H) 70 - 99 mg/dL   Recent Results (from the past 240 hour(s))  Blood culture (routine x 2)     Status: None (Preliminary result)   Collection Time: 01/06/15 10:00 AM  Result Value Ref Range Status   Specimen Description BLOOD LEFT ARM  Final   Special Requests BOTTLES DRAWN AEROBIC AND ANAEROBIC 10ML  Final   Culture   Final           BLOOD CULTURE RECEIVED NO GROWTH TO DATE CULTURE WILL BE HELD FOR 5 DAYS BEFORE ISSUING A FINAL NEGATIVE REPORT Performed at [facility]    Report Status PENDING  Incomplete  Blood culture (routine x 2)     Status: None (Preliminary result)   Collection Time: 01/06/15 10:15 AM  Result Value Ref Range Status   Specimen Description BLOOD LEFT HAND  Final   Special Requests   Final    BOTTLES DRAWN AEROBIC AND ANAEROBIC 10ML AEROBIC 7ML ANAEROBIC   Culture   Final           BLOOD CULTURE RECEIVED NO GROWTH TO DATE CULTURE WILL BE HELD FOR 5 DAYS BEFORE ISSUING A FINAL NEGATIVE REPORT Performed at [facility]    Report Status PENDING   Incomplete  Clostridium Difficile by PCR     Status: None   Collection Time: 01/06/15 10:46 AM  Result Value Ref Range Status   C difficile by pcr NEGATIVE NEGATIVE Final  Stool culture     Status: None   Collection Time: 01/06/15 10:46 AM  Result Value Ref Range Status   Specimen Description STOOL  Final   Special Requests Immunocompromised  Final   Culture   Final    NO SALMONELLA, SHIGELLA, CAMPYLOBACTER, YERSINIA, OR E.COLI 0157:H7 ISOLATED Performed at [facility]    Report Status 01/10/2015 FINAL  Final  Urine culture     Status: None   Collection Time: 01/06/15 10:56 AM  Result Value Ref Range Status   Specimen Description URINE, CATHETERIZED  Final   Special Requests Normal  Final   Colony Count   Final    >=100,000 COLONIES/ML Performed at [place] Performed at [facility]   Final   Report Status 01/09/2015 FINAL  Final   Creatinine:  Recent Labs  01/06/15 1942 01/06/15 2200 01/07/15 0109 01/07/15 0440 01/07/15 1355 01/08/15 0400 01/09/15 0342  CREATININE 1.45* 1.37* 1.33 1.07 0.81 0.59 0.60    Impression/Assessment/plan: Neurogenic bladder-suprapubic tube changed. Suprapubic tube changes are really a nursing issue but urology continues to be consulted. It is something that will have to be brought up again in medical and surgical services meeting as prior discussion I have heard from nursing in these meetings say that nursing is authorized to change suprapubic tubes. Also the patient gets his tube changed by a nurse on a regular basis.   NG bladder - follow-up outpatient with Dr. Matilde Sprang next available for continued urologic care.  Jason [name] 01/10/2015, 10:36 PM

## 2015-01-10 NOTE — Progress Notes (Signed)
[facility] for Infectious Disease    Subjective: Patient febrile to 103 today  Antibiotics:  Anti-infectives    Start     Dose/Rate Route Frequency Ordered Stop   01/09/15 1400  metroNIDAZOLE (FLAGYL) IVPB 500 mg     500 mg 100 mL/hr over 60 Minutes Intravenous Every 8 hours 01/09/15 1312     01/07/15 0400  vancomycin (VANCOCIN) 500 mg in sodium chloride 0.9 % 100 mL IVPB     500 mg 100 mL/hr over 60 Minutes Intravenous Every 12 hours 01/06/15 1738     01/06/15 1600  fluconazole (DIFLUCAN) IVPB 200 mg     200 mg 100 mL/hr over 60 Minutes Intravenous Every 24 hours 01/06/15 1521     01/06/15 1600  vancomycin (VANCOCIN) IVPB 1000 mg/200 mL premix     1,000 mg 200 mL/hr over 60 Minutes Intravenous  Once 01/06/15 1521 01/06/15 1722   01/06/15 1515  vancomycin (VANCOCIN) IVPB 1000 mg/200 mL premix  Status:  Discontinued     1,000 mg 200 mL/hr over 60 Minutes Intravenous Every 24 hours 01/06/15 1514 01/06/15 1515   01/06/15 1300  aztreonam (AZACTAM) 1 g in dextrose 5 % 50 mL IVPB     1 g 100 mL/hr over 30 Minutes Intravenous 3 times per day 01/06/15 1227        Medications: Scheduled Meds: . aztreonam  1 g Intravenous 3 times per day  . baclofen  10 mg Oral BID WC  . baclofen  20 mg Oral QHS  . collagenase   Topical Daily  . enoxaparin (LOVENOX) injection  40 mg Subcutaneous Q24H  . feeding supplement (ENSURE ENLIVE)  237 mL Oral TID BM  . fentaNYL  25 mcg Transdermal Q72H  . fluconazole (DIFLUCAN) IV  200 mg Intravenous Q24H  . insulin aspart  3 Units Subcutaneous TID WC  . insulin glargine  5 Units Subcutaneous QHS  . loratadine  10 mg Oral Daily  . metronidazole  500 mg Intravenous Q8H  . oxyCODONE-acetaminophen  1 tablet Oral TID PC   And  . oxyCODONE  5 mg Oral TID PC  . pantoprazole  40 mg Oral Daily  . potassium chloride SA  20 mEq Oral Daily  . pregabalin  100 mg Oral TID  . pregabalin  200 mg Oral Q2200  . QUEtiapine  25 mg Oral TID  . vancomycin  500  mg Intravenous Q12H   Continuous Infusions: . sodium chloride 50 mL/hr at 01/08/15 1030   PRN Meds:.acetaminophen, alum & mag hydroxide-simeth, fentaNYL, midodrine, sodium chloride, sodium chloride    Objective: Weight change: 1 lb 15.8 oz (0.9 kg)  Intake/Output Summary (Last 24 hours) at 01/10/15 1353 Last data filed at 01/10/15 1250  Gross per 24 hour  Intake   1200 ml  Output   3800 ml  Net  -2600 ml   Blood pressure 113/72, pulse 82, temperature 102.7 F (39.3 C), temperature source Oral, resp. rate 17, height 5\' 8"  (1.727 m), weight 126 lb 1.7 oz (57.2 kg), SpO2 99 %. Temp:  [98.4 F (36.9 C)-102.7 F (39.3 C)] 102.7 F (39.3 C) (04/04 0831) Pulse Rate:  [82-95] 82 (04/04 0831) Resp:  [16-18] 17 (04/04 0831) BP: (98-131)/(61-82) 113/72 mmHg (04/04 0831) SpO2:  [98 %-100 %] 99 % (04/04 0831) Weight:  [126 lb 1.7 oz (57.2 kg)] 126 lb 1.7 oz (57.2 kg) (04/03 2031)  Physical Exam: General: Alert and awake, oriented x3, not in any acute distress. HEENT: anicteric  sclera, pupils reactive to light and accommodation, EOMI, oropharynx clear and without exudate CVS regular rate, normal r, no murmur rubs or gallops Chest: clear to auscultation bilaterally, no wheezing, rales or rhonchi Abdomen: soft nontender, ostomy clean and suprapubic catheter clean   clubbing or edema noted bilaterally  Skin: I did not examine his decubitus ulcers today myself I did examine his feet and his diabetic foot ulcers do not seem to change much since I last saw him.  01/10/15:        Neuro: ;paraplegic CBC: CBC Latest Ref Rng 01/09/2015 01/08/2015 01/06/2015  WBC 4.0 - 10.5 K/uL 17.7(H) 13.3(H) 18.8(H)  Hemoglobin 13.0 - 17.0 g/dL 8.4(L) 8.2(L) 8.1(L)  Hematocrit 39.0 - 52.0 % 26.8(L) 25.5(L) 26.8(L)  Platelets 150 - 400 K/uL 599(H) 660(H) 608(H)       BMET  Recent Labs  01/08/15 0400 01/09/15 0342  NA 139 138  K 4.0 3.9  CL 117* 113*  CO2 22 19  GLUCOSE 88 125*  BUN 12 10    CREATININE 0.59 0.60  CALCIUM 9.7 9.7     Liver Panel   Recent Labs  01/08/15 0400 01/09/15 0342  PROT 6.5 6.6  ALBUMIN 1.9* 1.9*  AST 9 11  ALT 8 8  ALKPHOS 72 74  BILITOT <0.1* 0.3       Sedimentation Rate  Recent Labs  01/08/15 0400  ESRSEDRATE 75*   C-Reactive Protein  Recent Labs  01/08/15 0400  CRP 4.0*    Micro Results: Recent Results (from the past 720 hour(s))  Clostridium Difficile by PCR     Status: None   Collection Time: 12/12/14  4:22 AM  Result Value Ref Range Status   C difficile by pcr NEGATIVE NEGATIVE Final  MRSA PCR Screening     Status: None   Collection Time: 12/12/14  3:27 PM  Result Value Ref Range Status   MRSA by PCR NEGATIVE NEGATIVE Final    Comment:        The GeneXpert MRSA Assay (FDA approved for NASAL specimens only), is one component of a comprehensive MRSA colonization surveillance program. It is not intended to diagnose MRSA infection nor to guide or monitor treatment for MRSA infections.   Blood culture (routine x 2)     Status: None (Preliminary result)   Collection Time: 01/06/15 10:00 AM  Result Value Ref Range Status   Specimen Description BLOOD LEFT ARM  Final   Special Requests BOTTLES DRAWN AEROBIC AND ANAEROBIC 10ML  Final   Culture   Final           BLOOD CULTURE RECEIVED NO GROWTH TO DATE CULTURE WILL BE HELD FOR 5 DAYS BEFORE ISSUING A FINAL NEGATIVE REPORT Performed at [facility]    Report Status PENDING  Incomplete  Blood culture (routine x 2)     Status: None (Preliminary result)   Collection Time: 01/06/15 10:15 AM  Result Value Ref Range Status   Specimen Description BLOOD LEFT HAND  Final   Special Requests   Final    BOTTLES DRAWN AEROBIC AND ANAEROBIC 10ML AEROBIC 7ML ANAEROBIC   Culture   Final           BLOOD CULTURE RECEIVED NO GROWTH TO DATE CULTURE WILL BE HELD FOR 5 DAYS BEFORE ISSUING A FINAL NEGATIVE REPORT Performed at [facility]    Report Status PENDING   Incomplete  Clostridium Difficile by PCR     Status: None   Collection Time: 01/06/15 10:46  AM  Result Value Ref Range Status   C difficile by pcr NEGATIVE NEGATIVE Final  Stool culture     Status: None   Collection Time: 01/06/15 10:46 AM  Result Value Ref Range Status   Specimen Description STOOL  Final   Special Requests Immunocompromised  Final   Culture   Final    NO SALMONELLA, SHIGELLA, CAMPYLOBACTER, YERSINIA, OR E.COLI 0157:H7 ISOLATED Performed at [facility]    Report Status 01/10/2015 FINAL  Final  Urine culture     Status: None   Collection Time: 01/06/15 10:56 AM  Result Value Ref Range Status   Specimen Description URINE, CATHETERIZED  Final   Special Requests Normal  Final   Colony Count   Final    >=100,000 COLONIES/ML Performed at [place] Performed at [facility]   Final   Report Status 01/09/2015 FINAL  Final    Studies/Results: Dg Chest Port 1 View  01/10/2015   CLINICAL DATA:  Fever.  Diabetic foot ulcer.  EXAM: PORTABLE CHEST - 1 VIEW  COMPARISON:  01/06/2015  FINDINGS: Right arm PICC line remains in appropriate position.  Worsening symmetric bilateral mid and lower lung airspace disease is seen, which may be due to worsening edema or pneumonia. No significant pleural effusion seen on this portable exam. Heart size is normal.  IMPRESSION: Worsening symmetric mid and lower lung airspace disease, which may be due to worsening pulmonary edema or pneumonia.   Electronically Signed   By: Earle Gell M.D.   On: 01/10/2015 12:16      Assessment/Plan:  Principal Problem:   Sepsis Active Problems:   IDDM (insulin dependent diabetes mellitus)   Spinal cord infarction (history of)   Hypoglycemia   Acute encephalopathy   UTI (lower urinary tract infection)   Stage IV decubitus ulcer   AKI (acute kidney injury)   Suprapubic catheter   DKA (diabetic ketoacidoses)   Allergy to multiple antibiotics   Diabetic  ulcer of both feet associated with type 1 diabetes mellitus    Jason [name] is a 29 y.o. male male with paraplegia chronic sacral decubitus ulcerations and chronic osteomyelitis status post multiple debridements by Dr. Migdalia Dk multiple courses of antibiotics who continues with the another cycle of admission for "sepsis".    #1 Sepsis: certainly his presentation is a bit bizarre. He does have candiduria and does need his suprapubic catheter exchange that rarely seen patient's febrile to this degree or present with hypotension from funguria.  His MRI of his pelvis shows stable osteomyelitis and no new drainable foci of infection.  He does have chronic diabetic foot ulcers but they do not appear grossly changed.  I will obtain MRI of his left foot    His CXR read as showing worsening pneumonia but I am not convinced that is what is going on and he is fairly BROADLY covered with aztreonam vancomycin and metronidazole.  If he fails to improve with exchange of a superpubic catheter or worsens clinically I would get a CT of his chest and pelvis with contrast and broaden him to tigecycline and aztreonam and discontinue his vancomycin and metronidazole  Drug fever is another final consideration but one of which I hope he is NOT having  #2 DFU: see above re imaging of his DFU left side    LOS: 4 days   [name] 01/10/2015, 1:53 PM

## 2015-01-10 NOTE — Progress Notes (Signed)
Progress Note  Jason [name] SAY:[id number] DOB: 1986-07-31 DOA: 01/06/2015 PCP: [name], MD  Admit HPI / Brief Narrative: 29 y.o. 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 Patient was discharged from the hospital on 12/01/2014 after being treated for severe sepsis, aspiration pneumonia, and multiple sacral decubitus ulcers with aztreonam, vancomycin and flagyl. IV antibiotics were finished on 3/27 and according to his mother since then he had been getting progressively worse, w/ fever of 101.5, lethargic and not eating.  In the ED patient received Narcan without significant change, his mother reported that he did not get any extra pain medications, he was hypothermic with temperature of 96.5, his WBC was 18.8, patient admitted to stepdown for further evaluation.  Assessment/Plan:  Sepsis likely due to infected sacral decubs +/- UTI  Continued fevers. ID following. bc neg to date. Urine cx with yeast.  ID recommending changing SP cath again. Called urology. Wounds look clean  Stage IV sacral decubitus ulcer with ischial ulcers w/ osteomyelitis - various other skin wounds Patient was recently getting treated for ischial osteomyelitis with vancomycin, aztreonam and Flagyl - IV antibiotics finished on 01/02/2015 - MRI shows Underlying osteomyelitis within the ischium has mildly progressed compared with the MRI of 2 months ago. 2. No residual focal soft tissue abscess. 3. Generalized muscular edema and enhancement are similar to the prior examination. The subcutaneous edema has improved. 4. No significant hip findings. Viewed wounds with nursing today. No odor, no cellulitis. [place] sl[place]  UTI w/ indwelling suprapubic catheter / neurogenic bladder Urine culture with 100K yeast. See above. On diflucan  Early DKA - uncontrolled DM1 No  further severe lows, but CBG 74 at noon. Will recheck and decrease novolog to 2 units. 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 Per mother, minimal intake  Objective: Blood pressure 113/72, pulse 82, temperature 102.7 F (39.3 C), temperature source Oral, resp. rate 17, height 5\' 8"  (1.727 m), weight 57.2 kg (126 lb 1.7 oz), SpO2 99 %.  Intake/Output Summary (Last 24 hours) at 01/10/15 1532 Last data filed at 01/10/15 1410  Gross per 24 hour  Intake   1182 ml  Output   2850 ml  Net  -1668 ml   Exam: General: asleep. Arousable. oriented Lungs: Clear to auscultation bilaterally without wheezes or crackles Cardiovascular: Regular rate and rhythm without murmur gallop or rub  Abdomen: Nontender, nondistended, soft, bowel sounds positive, no rebound Extremities: No significant cyanosis, clubbing, or edema bilateral lower extremities Wounds:  Sacral and ischial decubiti without cellulitis, purulent drainage.  Data Reviewed: Basic Metabolic Panel:  Recent Labs Lab 01/06/15 1942  01/07/15 0109 01/07/15 0440 01/07/15 1355  01/08/15 0400 01/09/15 0342  NA 133*  < > 138 138 134* 139 138  K 4.1  < > 3.4* 3.3* 3.7 4.0 3.9  CL 109  < > 112 113* 111 117* 113*    CO2 17*  < > 17* 23 20 22 19   GLUCOSE 469*  < > 243* 108* 223* 88 125*  BUN 22  < > 22 19 16 12 10   CREATININE 1.45*  < > 1.33 1.07 0.81 0.59 0.60  CALCIUM 9.4  < > 9.7 9.5 9.5 9.7 9.7  MG 1.6  --   --   --   --   --   --   PHOS 4.5  --   --   --   --   --   --   < > = values in this interval not displayed. Liver Function Tests:  Recent Labs Lab 01/06/15 1015 01/08/15 0400 01/09/15 0342  AST 8 9 11   ALT 10 8 8   ALKPHOS 93 72 74  BILITOT 0.5 <0.1* 0.3  PROT 7.3 6.5 6.6  ALBUMIN 2.2* 1.9* 1.9*    Recent Labs Lab 01/06/15 1015  AMMONIA 39*   CBC:  Recent Labs Lab 01/06/15 1015 01/08/15 0400 01/09/15 0342  WBC 18.8* 13.3* 17.7*  HGB 8.1* 8.2* 8.4*  HCT 26.8* 25.5* 26.8*  MCV 86.5 83.3 83.5  PLT 608* 660* 599*    CBG:  Recent Labs Lab 01/09/15 1157 01/09/15 1727 01/09/15 2022 01/10/15 0733 01/10/15 1129  GLUCAP 159* 190* 269* 139* 74    Recent Results (from the past 240 hour(s))  Blood culture (routine x 2)     Status: None (Preliminary result)   Collection Time: 01/06/15 10:00 AM  Result Value Ref Range Status   Specimen Description BLOOD LEFT ARM  Final   Special Requests BOTTLES DRAWN AEROBIC AND ANAEROBIC 10ML  Final   Culture   Final           BLOOD CULTURE RECEIVED NO GROWTH TO DATE CULTURE WILL BE HELD FOR 5 DAYS BEFORE ISSUING A FINAL NEGATIVE REPORT Performed at [facility]    Report Status PENDING  Incomplete  Blood culture (routine x 2)     Status: None (Preliminary result)   Collection Time: 01/06/15 10:15 AM  Result Value Ref Range Status   Specimen Description BLOOD LEFT HAND  Final   Special Requests   Final    BOTTLES DRAWN AEROBIC AND ANAEROBIC 10ML AEROBIC 7ML ANAEROBIC   Culture   Final           BLOOD CULTURE RECEIVED NO GROWTH TO DATE CULTURE WILL BE HELD FOR 5 DAYS BEFORE ISSUING A FINAL NEGATIVE REPORT Performed at [facility]    Report Status PENDING  Incomplete  Clostridium Difficile by PCR     Status:  None   Collection Time: 01/06/15 10:46 AM  Result Value Ref Range Status   C difficile by pcr NEGATIVE NEGATIVE Final  Stool culture     Status: None   Collection Time: 01/06/15 10:46 AM  Result Value Ref Range Status   Specimen Description STOOL  Final   Special Requests Immunocompromised  Final   Culture   Final    NO SALMONELLA, SHIGELLA, CAMPYLOBACTER, YERSINIA, OR E.COLI 0157:H7 ISOLATED Performed at [facility]    Report Status 01/10/2015 FINAL  Final  Urine culture     Status: None   Collection Time: 01/06/15 10:56 AM  Result Value Ref Range Status  Specimen Description URINE, CATHETERIZED  Final   Special Requests Normal  Final   Colony Count   Final    >=100,000 COLONIES/ML Performed at [place] Performed at [facility]   Final   Report Status 01/09/2015 FINAL  Final     Studies:  Recent x-ray studies have been reviewed in detail by the Attending Physician  Scheduled Meds:  Scheduled Meds: . aztreonam  1 g Intravenous 3 times per day  . baclofen  10 mg Oral BID WC  . baclofen  20 mg Oral QHS  . collagenase   Topical Daily  . enoxaparin (LOVENOX) injection  40 mg Subcutaneous Q24H  . feeding supplement (ENSURE ENLIVE)  237 mL Oral TID BM  . fentaNYL  25 mcg Transdermal Q72H  . fluconazole (DIFLUCAN) IV  200 mg Intravenous Q24H  . insulin aspart  2 Units Subcutaneous TID WC  . insulin glargine  5 Units Subcutaneous QHS  . loratadine  10 mg Oral Daily  . metronidazole  500 mg Intravenous Q8H  . oxyCODONE-acetaminophen  1 tablet Oral TID PC   And  . oxyCODONE  5 mg Oral TID PC  . pantoprazole  40 mg Oral Daily  . potassium chloride SA  20 mEq Oral Daily  . pregabalin  100 mg Oral TID  . pregabalin  200 mg Oral Q2200  . QUEtiapine  25 mg Oral TID  . vancomycin  500 mg Intravenous Q12H    Time spent on care of this patient: 25 mins   Delfina Redwood , MD  [facility]  [email or web address] Password  [email or web address] 01/10/2015, 3:32 PM   LOS: 4 days

## 2015-01-10 NOTE — Telephone Encounter (Signed)
That would need to come from Pulmonary. Does he have a pulmonologist? If not I can refer him to one

## 2015-01-11 ENCOUNTER — Inpatient Hospital Stay (HOSPITAL_COMMUNITY): Payer: Medicaid Other

## 2015-01-11 LAB — PREALBUMIN: Prealbumin: 10 mg/dL (ref 18.0–45.0)

## 2015-01-11 LAB — GLUCOSE, CAPILLARY
GLUCOSE-CAPILLARY: 148 mg/dL — AB (ref 70–99)
Glucose-Capillary: 232 mg/dL — ABNORMAL HIGH (ref 70–99)
Glucose-Capillary: 267 mg/dL — ABNORMAL HIGH (ref 70–99)
Glucose-Capillary: 215 mg/dL — ABNORMAL HIGH (ref 70–99)
Glucose-Capillary: 194 mg/dL — ABNORMAL HIGH (ref 70–99)
Glucose-Capillary: 155 mg/dL — ABNORMAL HIGH (ref 70–99)

## 2015-01-11 MED ORDER — IOHEXOL 300 MG/ML  SOLN
25.0000 mL | INTRAMUSCULAR | Status: AC
  Administered 2015-01-11 (×2): 25 mL via ORAL

## 2015-01-11 MED ORDER — IOHEXOL 300 MG/ML  SOLN
100.0000 mL | Freq: Once | INTRAMUSCULAR | Status: AC | PRN
  Administered 2015-01-11: 80 mL via INTRAVENOUS

## 2015-01-11 MED ORDER — INSULIN ASPART 100 UNIT/ML ~~LOC~~ SOLN
3.0000 [IU] | Freq: Three times a day (TID) | SUBCUTANEOUS | Status: DC
  Administered 2015-01-11 – 2015-01-12 (×3): 3 [IU] via SUBCUTANEOUS

## 2015-01-11 MED ORDER — INSULIN GLARGINE 100 UNIT/ML ~~LOC~~ SOLN
8.0000 [IU] | Freq: Every day | SUBCUTANEOUS | Status: DC
  Administered 2015-01-11: 8 [IU] via SUBCUTANEOUS
  Filled 2015-01-11 (×2): qty 0.08

## 2015-01-11 NOTE — Progress Notes (Addendum)
Procedure note: Sp tube exchange 01/10/15   Old foley balloon deflated and foley easily removed.  Area prepped and draped in a sterile fashion with betadine and sterile drapes.  New 5f foley inserted without difficulty and balloon inflated with 10cc sterile saline.  Foley placed in thigh lock and secured.  Small amount of clear urine noted in tubing.  Clean drain sponge placed over site.  Pt tolerated well.

## 2015-01-11 NOTE — Progress Notes (Signed)
[place] for Infectious Disease    Subjective: Patient febrile today,  Antibiotics:  Anti-infectives    Start     Dose/Rate Route Frequency Ordered Stop   01/09/15 1400  metroNIDAZOLE (FLAGYL) IVPB 500 mg     500 mg 100 mL/hr over 60 Minutes Intravenous Every 8 hours 01/09/15 1312     01/07/15 0400  vancomycin (VANCOCIN) 500 mg in sodium chloride 0.9 % 100 mL IVPB  Status:  Discontinued     500 mg 100 mL/hr over 60 Minutes Intravenous Every 12 hours 01/06/15 1738 01/11/15 0958   01/06/15 1600  fluconazole (DIFLUCAN) IVPB 200 mg     200 mg 100 mL/hr over 60 Minutes Intravenous Every 24 hours 01/06/15 1521     01/06/15 1600  vancomycin (VANCOCIN) IVPB 1000 mg/200 mL premix     1,000 mg 200 mL/hr over 60 Minutes Intravenous  Once 01/06/15 1521 01/06/15 1722   01/06/15 1515  vancomycin (VANCOCIN) IVPB 1000 mg/200 mL premix  Status:  Discontinued     1,000 mg 200 mL/hr over 60 Minutes Intravenous Every 24 hours 01/06/15 1514 01/06/15 1515   01/06/15 1300  aztreonam (AZACTAM) 1 g in dextrose 5 % 50 mL IVPB     1 g 100 mL/hr over 30 Minutes Intravenous 3 times per day 01/06/15 1227        Medications: Scheduled Meds: . aztreonam  1 g Intravenous 3 times per day  . baclofen  10 mg Oral BID WC  . baclofen  20 mg Oral QHS  . collagenase   Topical Daily  . enoxaparin (LOVENOX) injection  40 mg Subcutaneous Q24H  . feeding supplement (ENSURE ENLIVE)  237 mL Oral TID BM  . fentaNYL  25 mcg Transdermal Q72H  . fluconazole (DIFLUCAN) IV  200 mg Intravenous Q24H  . insulin aspart  2 Units Subcutaneous TID WC  . insulin glargine  5 Units Subcutaneous QHS  . iohexol  25 mL Oral Q1 Hr x 2  . loratadine  10 mg Oral Daily  . metronidazole  500 mg Intravenous Q8H  . oxyCODONE-acetaminophen  1 tablet Oral TID PC   And  . oxyCODONE  5 mg Oral TID PC  . pantoprazole  40 mg Oral Daily  . potassium chloride SA  20 mEq Oral Daily  . pregabalin  100 mg Oral TID  . pregabalin  200 mg  Oral Q2200  . QUEtiapine  25 mg Oral TID   Continuous Infusions: . sodium chloride 20 mL (01/11/15 0554)   PRN Meds:.acetaminophen, alum & mag hydroxide-simeth, fentaNYL, midodrine, sodium chloride, sodium chloride    Objective: Weight change:   Intake/Output Summary (Last 24 hours) at 01/11/15 1206 Last data filed at 01/11/15 0942  Gross per 24 hour  Intake    942 ml  Output   2350 ml  Net  -1408 ml   Blood pressure 100/66, pulse 95, temperature 101.5 F (38.6 C), temperature source Oral, resp. rate 16, height 5\' 8"  (1.727 m), weight 126 lb 1.7 oz (57.2 kg), SpO2 100 %. Temp:  [98.9 F (37.2 C)-101.5 F (38.6 C)] 101.5 F (38.6 C) (04/05 0942) Pulse Rate:  [78-95] 95 (04/05 0942) Resp:  [16-17] 16 (04/05 0942) BP: (100-109)/(65-68) 100/66 mmHg (04/05 0942) SpO2:  [96 %-100 %] 100 % (04/05 0942)  Physical Exam: General: Alert and awake, oriented x3, not in any acute distress. HEENT: anicteric sclera, pupils reactive to light and accommodation, EOMI, oropharynx clear and without exudate CVS regular rate,  normal r, no murmur rubs or gallops Chest: clear to auscultation bilaterally, no wheezing, rales or rhonchi Abdomen: soft nontender, ostomy clean and suprapubic catheter clean   clubbing or edema noted bilaterally  Skin: I did not examine his decubitus ulcers today myself I did examine his feet and his diabetic foot ulcers do not seem to change much since I last saw him.  01/10/15:        Neuro: ;paraplegic CBC: CBC Latest Ref Rng 01/09/2015 01/08/2015 01/06/2015  WBC 4.0 - 10.5 K/uL 17.7(H) 13.3(H) 18.8(H)  Hemoglobin 13.0 - 17.0 g/dL 8.4(L) 8.2(L) 8.1(L)  Hematocrit 39.0 - 52.0 % 26.8(L) 25.5(L) 26.8(L)  Platelets 150 - 400 K/uL 599(H) 660(H) 608(H)       BMET  Recent Labs  01/09/15 0342  NA 138  K 3.9  CL 113*  CO2 19  GLUCOSE 125*  BUN 10  CREATININE 0.60  CALCIUM 9.7     Liver Panel   Recent Labs  01/09/15 0342  PROT 6.6  ALBUMIN  1.9*  AST 11  ALT 8  ALKPHOS 74  BILITOT 0.3       Sedimentation Rate No results for input(s): ESRSEDRATE in the last 72 hours. C-Reactive Protein No results for input(s): CRP in the last 72 hours.  Micro Results: Recent Results (from the past 720 hour(s))  MRSA PCR Screening     Status: None   Collection Time: 12/12/14  3:27 PM  Result Value Ref Range Status   MRSA by PCR NEGATIVE NEGATIVE Final    Comment:        The GeneXpert MRSA Assay (FDA approved for NASAL specimens only), is one component of a comprehensive MRSA colonization surveillance program. It is not intended to diagnose MRSA infection nor to guide or monitor treatment for MRSA infections.   Blood culture (routine x 2)     Status: None (Preliminary result)   Collection Time: 01/06/15 10:00 AM  Result Value Ref Range Status   Specimen Description BLOOD LEFT ARM  Final   Special Requests BOTTLES DRAWN AEROBIC AND ANAEROBIC 10ML  Final   Culture   Final           BLOOD CULTURE RECEIVED NO GROWTH TO DATE CULTURE WILL BE HELD FOR 5 DAYS BEFORE ISSUING A FINAL NEGATIVE REPORT Performed at [facility]    Report Status PENDING  Incomplete  Blood culture (routine x 2)     Status: None (Preliminary result)   Collection Time: 01/06/15 10:15 AM  Result Value Ref Range Status   Specimen Description BLOOD LEFT HAND  Final   Special Requests   Final    BOTTLES DRAWN AEROBIC AND ANAEROBIC 10ML AEROBIC 7ML ANAEROBIC   Culture   Final           BLOOD CULTURE RECEIVED NO GROWTH TO DATE CULTURE WILL BE HELD FOR 5 DAYS BEFORE ISSUING A FINAL NEGATIVE REPORT Performed at [facility]    Report Status PENDING  Incomplete  Clostridium Difficile by PCR     Status: None   Collection Time: 01/06/15 10:46 AM  Result Value Ref Range Status   C difficile by pcr NEGATIVE NEGATIVE Final  Stool culture     Status: None   Collection Time: 01/06/15 10:46 AM  Result Value Ref Range Status   Specimen Description  STOOL  Final   Special Requests Immunocompromised  Final   Culture   Final    NO SALMONELLA, SHIGELLA, CAMPYLOBACTER, YERSINIA, OR E.COLI 0157:H7 ISOLATED Performed  at [facility]    Report Status 01/10/2015 FINAL  Final  Urine culture     Status: None   Collection Time: 01/06/15 10:56 AM  Result Value Ref Range Status   Specimen Description URINE, CATHETERIZED  Final   Special Requests Normal  Final   Colony Count   Final    >=100,000 COLONIES/ML Performed at [place] Performed at [facility]   Final   Report Status 01/09/2015 FINAL  Final  Blood Cultures x 2 sites     Status: None (Preliminary result)   Collection Time: 01/10/15 11:28 AM  Result Value Ref Range Status   Specimen Description BLOOD LEFT ANTECUBITAL  Final   Special Requests BOTTLES DRAWN AEROBIC AND ANAEROBIC 10CC  Final   Culture   Final           BLOOD CULTURE RECEIVED NO GROWTH TO DATE CULTURE WILL BE HELD FOR 5 DAYS BEFORE ISSUING A FINAL NEGATIVE REPORT Performed at [facility]    Report Status PENDING  Incomplete  Blood Cultures x 2 sites     Status: None (Preliminary result)   Collection Time: 01/10/15 11:41 AM  Result Value Ref Range Status   Specimen Description BLOOD LEFT HAND  Final   Special Requests BOTTLES DRAWN AEROBIC ONLY 4CC  Final   Culture   Final           BLOOD CULTURE RECEIVED NO GROWTH TO DATE CULTURE WILL BE HELD FOR 5 DAYS BEFORE ISSUING A FINAL NEGATIVE REPORT Performed at [facility]    Report Status PENDING  Incomplete    Studies/Results: Mri Left Foot Without Contrast  01/11/2015   CLINICAL DATA:  Diabetic foot ulcers  EXAM: MRI OF THE LEFT FOREFOOT WITHOUT CONTRAST  TECHNIQUE: Multiplanar, multisequence MR imaging was performed. No intravenous contrast was administered.  COMPARISON:  11/17/2014 radiographs hand prior MRI 09/16/2014  FINDINGS: No definite MR findings to suggest septic arthritis or osteomyelitis.  No significant changes of cellulitis. There is diffuse edema like signal abnormality in the short flexor muscles of the foot suggesting myositis. No focal fluid collections to suggest a drainable abscess.  IMPRESSION: No definite MR findings for septic arthritis or osteomyelitis.  No cellulitis or drainable soft tissue abscess.  Diffuse myositis.   Electronically Signed   By: Marijo Sanes M.D.   On: 01/11/2015 08:46   Dg Chest Port 1 View  01/10/2015   CLINICAL DATA:  Fever.  Diabetic foot ulcer.  EXAM: PORTABLE CHEST - 1 VIEW  COMPARISON:  01/06/2015  FINDINGS: Right arm PICC line remains in appropriate position.  Worsening symmetric bilateral mid and lower lung airspace disease is seen, which may be due to worsening edema or pneumonia. No significant pleural effusion seen on this portable exam. Heart size is normal.  IMPRESSION: Worsening symmetric mid and lower lung airspace disease, which may be due to worsening pulmonary edema or pneumonia.   Electronically Signed   By: Earle Gell M.D.   On: 01/10/2015 12:16      Assessment/Plan:  Principal Problem:   Sepsis Active Problems:   IDDM (insulin dependent diabetes mellitus)   Spinal cord infarction (history of)   Hypoglycemia   Acute encephalopathy   UTI (lower urinary tract infection)   Stage IV decubitus ulcer   AKI (acute kidney injury)   Suprapubic catheter   DKA (diabetic ketoacidoses)   Allergy to multiple antibiotics   Diabetic ulcer of both feet associated  with type 1 diabetes mellitus   Acidosis    Jason [name] is a 29 y.o. male male with paraplegia chronic sacral decubitus ulcerations and chronic osteomyelitis status post multiple debridements by Dr. Migdalia Dk multiple courses of antibiotics who continues with the another cycle of admission for "sepsis".    #1 Sepsis: certainly his presentation is a bit bizarre. He does have candiduria and does need his suprapubic catheter exchange that rarely seen patient's febrile to  this degree or present with hypotension from funguria.  His MRI of his pelvis shows stable osteomyelitis and no new drainable foci of infection.  He does have chronic diabetic foot ulcers but they do not appear grossly changed and MRI does not show drainable abscess and I suspect the myositis is just chronic muscle changes related to his paralysis   His CXR read as showing worsening pneumonia but I am not convinced that is what is going on and he is fairly BROADLY covered with aztreonam vancomycin and metronidazole.  I will get CT chest abdomen and pelvis today to look for occult abdominal infection and better define pathology in lungs. DC vancomycin while getting contrast  He mentions that he had H pylori and indeed this was diagnosed in 2014 by Dr. Hilarie Fredrickson  Unfortunately pt is on PPI so we cannot get a stool ag that will be reliably and antibodies are useless post prior infection  Could ask GI to endoscope to make a diagnosis vs therapeutic trial if all else fails  #2 DFU: see above re imaging of his DFU left side    LOS: 5 days   [name] 01/11/2015, 12:06 PM

## 2015-01-11 NOTE — Progress Notes (Signed)
Progress Note  Jason [name] UDJ:[id number] DOB: 1986-03-12 DOA: 01/06/2015 PCP: [name], MD  Admit HPI / Brief Narrative: 29 y.o. 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 Patient was discharged from the hospital on 12/01/2014 after being treated for severe sepsis, aspiration pneumonia, and multiple sacral decubitus ulcers with aztreonam, vancomycin and flagyl. IV antibiotics were finished on 3/27 and according to his mother since then he had been getting progressively worse, w/ fever of 101.5, lethargic and not eating.  In the ED patient received Narcan without significant change, his mother reported that he did not get any extra pain medications, he was hypothermic with temperature of 96.5, his WBC was 18.8, patient admitted to stepdown for further evaluation.  Assessment/Plan:  Sepsis  Continued fevers. bc neg to date. Urine cx with yeast. SP cath changed by urology. CT C/A/P shows bilateral pneumonia. 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 2. No residual focal soft tissue abscess. 3. Generalized muscular edema and enhancement are similar to the prior examination. The subcutaneous edema has improved. 4. No significant hip findings. Viewed wounds with nursing today. No odor, no cellulitis. Grey slough  UTI w/ indwelling suprapubic catheter / neurogenic bladder Urine culture with 100K yeast. See above. On diflucan  Early DKA - uncontrolled DM1, brittle No further lows and appetite improving.  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 No cough. Per mother, was coughing a lot in SDU  Objective: Blood pressure 95/65, pulse 87, temperature 98.8 F (37.1 C), temperature source Oral, resp. rate 17, height 5\' 8"  (1.727 m), weight 57.2 kg (126 lb 1.7 oz), SpO2 95 %.  Intake/Output Summary (Last 24 hours) at 01/11/15 1647 Last data filed at 01/11/15 1538  Gross per 24 hour  Intake    720 ml  Output   1850 ml  Net  -1130 ml   Exam: General: asleep. Arousable. oriented Lungs: Clear to auscultation bilaterally without wheezes or crackles Cardiovascular: Regular rate and rhythm without murmur gallop or rub  Abdomen: Nontender, nondistended, soft, bowel sounds positive, no rebound Extremities: No significant cyanosis, clubbing, or edema bilateral lower extremities Wounds:  Sacral and ischial decubiti without cellulitis, purulent drainage.  Data Reviewed: Basic Metabolic Panel:  Recent Labs Lab 01/06/15 1942  01/07/15 0109 01/07/15 0440 01/07/15 1355 01/08/15 0400 01/09/15 0342  NA 133*  < > 138 138 134*  139 138  K 4.1  < > 3.4* 3.3* 3.7 4.0 3.9  CL 109  < > 112 113* 111 117* 113*  CO2 17*  < > 17* 23 20 22 19   GLUCOSE 469*  < > 243* 108* 223* 88 125*    BUN 22  < > 22 19 16 12 10   CREATININE 1.45*  < > 1.33 1.07 0.81 0.59 0.60  CALCIUM 9.4  < > 9.7 9.5 9.5 9.7 9.7  MG 1.6  --   --   --   --   --   --   PHOS 4.5  --   --   --   --   --   --   < > = values in this interval not displayed. Liver Function Tests:  Recent Labs Lab 01/06/15 1015 01/08/15 0400 01/09/15 0342  AST 8 9 11   ALT 10 8 8   ALKPHOS 93 72 74  BILITOT 0.5 <0.1* 0.3  PROT 7.3 6.5 6.6  ALBUMIN 2.2* 1.9* 1.9*    Recent Labs Lab 01/06/15 1015  AMMONIA 39*   CBC:  Recent Labs Lab 01/06/15 1015 01/08/15 0400 01/09/15 0342  WBC 18.8* 13.3* 17.7*  HGB 8.1* 8.2* 8.4*  HCT 26.8* 25.5* 26.8*  MCV 86.5 83.3 83.5  PLT 608* 660* 599*    CBG:  Recent Labs Lab 01/10/15 2303 01/11/15 0214 01/11/15 0858 01/11/15 1131 01/11/15 1630  GLUCAP 232* 267* 215* 194* 148*    Recent Results (from the past 240 hour(s))  Blood culture (routine x 2)     Status: None (Preliminary result)   Collection Time: 01/06/15 10:00 AM  Result Value Ref Range Status   Specimen Description BLOOD LEFT ARM  Final   Special Requests BOTTLES DRAWN AEROBIC AND ANAEROBIC 10ML  Final   Culture   Final           BLOOD CULTURE RECEIVED NO GROWTH TO DATE CULTURE WILL BE HELD FOR 5 DAYS BEFORE ISSUING A FINAL NEGATIVE REPORT Performed at [facility]    Report Status PENDING  Incomplete  Blood culture (routine x 2)     Status: None (Preliminary result)   Collection Time: 01/06/15 10:15 AM  Result Value Ref Range Status   Specimen Description BLOOD LEFT HAND  Final   Special Requests   Final    BOTTLES DRAWN AEROBIC AND ANAEROBIC 10ML AEROBIC 7ML ANAEROBIC   Culture   Final           BLOOD CULTURE RECEIVED NO GROWTH TO DATE CULTURE WILL BE HELD FOR 5 DAYS BEFORE ISSUING A FINAL NEGATIVE REPORT Performed at [facility]    Report Status PENDING  Incomplete  Clostridium Difficile by PCR     Status: None   Collection Time: 01/06/15 10:46 AM  Result Value Ref Range  Status   C difficile by pcr NEGATIVE NEGATIVE Final  Stool culture     Status: None   Collection Time: 01/06/15 10:46 AM  Result Value Ref Range Status   Specimen Description STOOL  Final   Special Requests Immunocompromised  Final   Culture   Final    NO SALMONELLA, SHIGELLA, CAMPYLOBACTER, YERSINIA, OR E.COLI 0157:H7 ISOLATED Performed at [facility]    Report Status 01/10/2015 FINAL  Final  Urine culture     Status: None   Collection Time: 01/06/15 10:56 AM  Result Value Ref Range Status   Specimen Description URINE, CATHETERIZED  Final   Special Requests Normal  Final  Colony Count   Final    >=100,000 COLONIES/ML Performed at [place] Performed at [facility]   Final   Report Status 01/09/2015 FINAL  Final  Blood Cultures x 2 sites     Status: None (Preliminary result)   Collection Time: 01/10/15 11:28 AM  Result Value Ref Range Status   Specimen Description BLOOD LEFT ANTECUBITAL  Final   Special Requests BOTTLES DRAWN AEROBIC AND ANAEROBIC 10CC  Final   Culture   Final           BLOOD CULTURE RECEIVED NO GROWTH TO DATE CULTURE WILL BE HELD FOR 5 DAYS BEFORE ISSUING A FINAL NEGATIVE REPORT Performed at [facility]    Report Status PENDING  Incomplete  Blood Cultures x 2 sites     Status: None (Preliminary result)   Collection Time: 01/10/15 11:41 AM  Result Value Ref Range Status   Specimen Description BLOOD LEFT HAND  Final   Special Requests BOTTLES DRAWN AEROBIC ONLY 4CC  Final   Culture   Final           BLOOD CULTURE RECEIVED NO GROWTH TO DATE CULTURE WILL BE HELD FOR 5 DAYS BEFORE ISSUING A FINAL NEGATIVE REPORT Performed at [facility]    Report Status PENDING  Incomplete     Studies:  Recent x-ray studies have been reviewed in detail by the Attending Physician  Scheduled Meds:  Scheduled Meds: . aztreonam  1 g Intravenous 3 times per day  . baclofen  10 mg Oral BID WC  . baclofen   20 mg Oral QHS  . collagenase   Topical Daily  . enoxaparin (LOVENOX) injection  40 mg Subcutaneous Q24H  . feeding supplement (ENSURE ENLIVE)  237 mL Oral TID BM  . fentaNYL  25 mcg Transdermal Q72H  . fluconazole (DIFLUCAN) IV  200 mg Intravenous Q24H  . insulin aspart  2 Units Subcutaneous TID WC  . insulin glargine  5 Units Subcutaneous QHS  . loratadine  10 mg Oral Daily  . metronidazole  500 mg Intravenous Q8H  . oxyCODONE-acetaminophen  1 tablet Oral TID PC   And  . oxyCODONE  5 mg Oral TID PC  . pantoprazole  40 mg Oral Daily  . potassium chloride SA  20 mEq Oral Daily  . pregabalin  100 mg Oral TID  . pregabalin  200 mg Oral Q2200  . QUEtiapine  25 mg Oral TID    Time spent on care of this patient: 25 mins   Delfina Redwood , MD  [facility]  [email or web address] Password [email or web address] 01/11/2015, 4:47 PM   LOS: 5 days

## 2015-01-11 NOTE — Care Management Note (Signed)
CARE MANAGEMENT NOTE 01/11/2015  Patient:  Jason [name], Jason [name]   Account Number:  [id number]  Date Initiated:  01/07/2015  Documentation initiated by:  COLE,ANGELA  Subjective/Objective Assessment:   PTA from home with mom, bed  bound. Hx of  spinal CVA , encephalopathy. Admitted with sepsis/uti.     Action/Plan:   Return to home when medically stable.  01/11/2015 Met with pt and mother, she has concerns that he may need home oxygen , however not on oxygen now. this CM provided PCS applicatiion. Active with HH and will resume   Anticipated DC Date:     Anticipated DC Plan:  [place]  CM consult      Choice offered to / List presented to:          [facility] arranged  HH-1 RN      [place]   Status of service:  Completed, signed off Medicare Important Message given?  NO (If response is "NO", the following Medicare IM given date fields will be blank) Date Medicare IM given:   Medicare IM given by:   Date Additional Medicare IM given:   Additional Medicare IM given by:    Discharge Disposition:  [place]  Per UR Regulation:  Reviewed for med. necessity/level of care/duration of stay  If discussed at [place] of Stay Meetings, dates discussed:    Comments:  01/11/2015 Met with pt and pt mother, plan is to d/c to home with [name] to continue [facility] services for wound care. Mother states that they have no other DME needs and she would like to have oxygen, however pt does not qualify for home O2 at this time he currently is not using oxygen. Mother states that she is aware of [place] and feels that he would qualify, however he has not wanted these services in the past. This CM provided an updated application form for pt mother to take to pt PCP if they determine that extra assistance is needed. CRoyal RN MPH, case manager, [phone]

## 2015-01-11 NOTE — Telephone Encounter (Signed)
I understand he is back in the hospital now. They can refer to a pulmonologist while there

## 2015-01-12 DIAGNOSIS — L139 Bullous disorder, unspecified: Secondary | ICD-10-CM | POA: Insufficient documentation

## 2015-01-12 DIAGNOSIS — E162 Hypoglycemia, unspecified: Secondary | ICD-10-CM

## 2015-01-12 DIAGNOSIS — E872 Acidosis: Secondary | ICD-10-CM

## 2015-01-12 DIAGNOSIS — R509 Fever, unspecified: Secondary | ICD-10-CM | POA: Insufficient documentation

## 2015-01-12 DIAGNOSIS — J189 Pneumonia, unspecified organism: Secondary | ICD-10-CM | POA: Insufficient documentation

## 2015-01-12 LAB — CULTURE, BLOOD (ROUTINE X 2)
CULTURE: NO GROWTH
Culture: NO GROWTH

## 2015-01-12 LAB — BASIC METABOLIC PANEL WITH GFR
Anion gap: 6 (ref 5–15)
BUN: 10 mg/dL (ref 6–23)
CO2: 23 mmol/L (ref 19–32)
Calcium: 9.6 mg/dL (ref 8.4–10.5)
Chloride: 101 mmol/L (ref 96–112)
Creatinine, Ser: 0.72 mg/dL (ref 0.50–1.35)
GFR calc Af Amer: >90 mL/min
GFR calc non Af Amer: >90 mL/min
Glucose, Bld: 337 mg/dL — ABNORMAL HIGH (ref 70–99)
Potassium: 4.3 mmol/L (ref 3.5–5.1)
Sodium: 130 mmol/L — ABNORMAL LOW (ref 135–145)

## 2015-01-12 LAB — GLUCOSE, CAPILLARY
Glucose-Capillary: 289 mg/dL — ABNORMAL HIGH (ref 70–99)
Glucose-Capillary: 232 mg/dL — ABNORMAL HIGH (ref 70–99)
Glucose-Capillary: 272 mg/dL — ABNORMAL HIGH (ref 70–99)

## 2015-01-12 MED ORDER — GLUCERNA SHAKE PO LIQD
237.0000 mL | Freq: Three times a day (TID) | ORAL | Status: DC
  Administered 2015-01-12 – 2015-01-14 (×8): 237 mL via ORAL

## 2015-01-12 MED ORDER — PRO-STAT SUGAR FREE PO LIQD
30.0000 mL | Freq: Two times a day (BID) | ORAL | Status: DC
  Administered 2015-01-12 – 2015-01-14 (×5): 30 mL via ORAL
  Filled 2015-01-12 (×7): qty 30

## 2015-01-12 MED ORDER — INSULIN GLARGINE 100 UNIT/ML ~~LOC~~ SOLN
12.0000 [IU] | Freq: Every day | SUBCUTANEOUS | Status: DC
  Administered 2015-01-12: 12 [IU] via SUBCUTANEOUS
  Filled 2015-01-12 (×2): qty 0.12

## 2015-01-12 MED ORDER — INSULIN ASPART 100 UNIT/ML ~~LOC~~ SOLN
5.0000 [IU] | Freq: Three times a day (TID) | SUBCUTANEOUS | Status: DC
  Administered 2015-01-12 – 2015-01-14 (×6): 5 [IU] via SUBCUTANEOUS

## 2015-01-12 MED ORDER — TIGECYCLINE 50 MG IV SOLR
100.0000 mg | Freq: Once | INTRAVENOUS | Status: AC
  Administered 2015-01-12: 100 mg via INTRAVENOUS
  Filled 2015-01-12: qty 100

## 2015-01-12 MED ORDER — TIGECYCLINE 50 MG IV SOLR
50.0000 mg | Freq: Two times a day (BID) | INTRAVENOUS | Status: DC
  Administered 2015-01-12 – 2015-01-14 (×4): 50 mg via INTRAVENOUS
  Filled 2015-01-12 (×5): qty 100

## 2015-01-12 NOTE — Progress Notes (Signed)
NUTRITION FOLLOW UP  DOCUMENTATION CODES Per approved criteria  -Severe malnutrition in the context of chronic illness   INTERVENTION: Discontinue Ensure.  Provide Glucerna Shake po TID, each supplement provides 220 kcal and 10 grams of protein.  Provide 30 ml Prostat po BID, each supplement provides 100 kcal and 15 grams of protein.  Provide nourishment snacks ([place] sandwich, yogurt). Ordered.  Encourage adequate PO intake.  NUTRITION DIAGNOSIS: Malnutrition related to chronic illness as evidenced by intake >/= 75% of his needs for >/= 1 month, severe fat depletion, and 6% weight loss x 1 month per mom; ongoing  Goal: Pt to meet >/= 90% of their estimated nutrition needs; progressing  Monitor:  PO intake, supplement acceptance, skin, weight trends, labs, I/O's  ASSESSMENT: Pt with hx of type 1 DM, spinal infarct with quadriplegia, and stage IV PU and ischial osteomyelitis, who was admitted with lethargy and fever determined to be sepsis. Pt was recently hospitalized for the same.   Pt reports his appetite has been improving. Meal completion has been varied from 0-75%. Pt has been consuming his Ensure, however per RN, pt prefers to have Glucerna instead. RD to modify order. RD to additionally order Prostat to aid in wound healing. Pt was encouraged to eat his food at meals and to take his supplements.   Labs and medications reviewed.  Height: Ht Readings from Last 1 Encounters:  01/06/15 5\' 8"  (1.727 m)    Weight: Wt Readings from Last 1 Encounters:  01/11/15 126 lb 15.8 oz (57.6 kg)  01/06/15 131 lbs  BMI:  Body mass index is 19.31 kg/(m^2).  Re-Estimated Nutritional Needs: Kcal: 2100-2300 Protein: 105- 120 grams Fluid: 2.1 - 2.3 L/day  Skin: Non-pitting LE edema Unstageable PU: ischial tuberosity Stage IV PU: ischial tuberosity Stage III PU: left toe Stage IV PU: sacrum Stage IV PU: right ischial tuberosity Stage II PU: left finger Stage II PU:  buttocks Stage III PU: left heel  Diet Order: Diet Carb Modified Fluid consistency:: Thin; Room service appropriate?: Yes   Intake/Output Summary (Last 24 hours) at 01/12/15 1453 Last data filed at 01/12/15 1117  Gross per 24 hour  Intake   1252 ml  Output   2750 ml  Net  -1498 ml    Last BM: via colostomy-4/5  Labs:   Recent Labs Lab 01/06/15 1942  01/08/15 0400 01/09/15 0342 01/12/15 0505  NA 133*  < > 139 138 130*  K 4.1  < > 4.0 3.9 4.3  CL 109  < > 117* 113* 101  CO2 17*  < > 22 19 23   BUN 22  < > 12 10 10   CREATININE 1.45*  < > 0.59 0.60 0.72  CALCIUM 9.4  < > 9.7 9.7 9.6  MG 1.6  --   --   --   --   PHOS 4.5  --   --   --   --   GLUCOSE 469*  < > 88 125* 337*  < > = values in this interval not displayed.  CBG (last 3)   Recent Labs  01/11/15 1630 01/11/15 2038 01/12/15 1315  GLUCAP 148* 155* 289*    Scheduled Meds: . aztreonam  1 g Intravenous 3 times per day  . baclofen  10 mg Oral BID WC  . baclofen  20 mg Oral QHS  . collagenase   Topical Daily  . enoxaparin (LOVENOX) injection  40 mg Subcutaneous Q24H  . feeding supplement (GLUCERNA SHAKE)  237 mL Oral  TID BM  . feeding supplement (PRO-STAT SUGAR FREE 64)  30 mL Oral BID  . fentaNYL  25 mcg Transdermal Q72H  . fluconazole (DIFLUCAN) IV  200 mg Intravenous Q24H  . insulin aspart  3 Units Subcutaneous TID WC  . insulin glargine  8 Units Subcutaneous QHS  . loratadine  10 mg Oral Daily  . oxyCODONE-acetaminophen  1 tablet Oral TID PC   And  . oxyCODONE  5 mg Oral TID PC  . pantoprazole  40 mg Oral Daily  . potassium chloride SA  20 mEq Oral Daily  . pregabalin  100 mg Oral TID  . pregabalin  200 mg Oral Q2200  . QUEtiapine  25 mg Oral TID  . tigecycline (TYGACIL) IVPB  50 mg Intravenous Q12H    Continuous Infusions: . sodium chloride 20 mL (01/11/15 0554)    Kallie Locks, MS, RD, LDN Pager # [phone] After hours/ weekend pager # [phone]

## 2015-01-12 NOTE — Progress Notes (Addendum)
Progress Note  Jason [name] [id number] DOB: 1986/03/29 DOA: 01/06/2015 PCP: [name], MD  Admit HPI / Brief Narrative: 29 y.o. 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 Patient was discharged from the hospital on 12/01/2014 after being treated for severe sepsis, aspiration pneumonia, and multiple sacral decubitus ulcers with aztreonam, vancomycin and flagyl. IV antibiotics were finished on 3/27 and according to his mother since then he had been getting progressively worse, w/ fever of 101.5, lethargic and not eating.  In the ED patient received Narcan without significant change, his mother reported that he did not get any extra pain medications, he was hypothermic with temperature of 96.5, his WBC was 18.8, patient admitted to stepdown for further evaluation.  Assessment/Plan:  Sepsis  Continued fevers. bc neg. Urine cx with yeast. SP cath changed by urology. CT C/A/P shows bilateral pneumonia. 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 2. No residual focal soft tissue abscess. 3. Generalized muscular edema and enhancement are similar to the prior examination. The subcutaneous edema has improved. 4. No significant hip findings. Viewed wounds with nursing today. No odor, no cellulitis. Grey slough  UTI w/ indwelling suprapubic catheter / neurogenic bladder Urine culture with 100K yeast. See above. On diflucan  Early DKA - uncontrolled DM1, brittle No further lows and appetite improving. Will  continue to increase levemir and novolog slowly  Chronic hypotension On midodrine - stopped all other BP meds - BP currently stable   Acute encephalopathy  Likely secondary to sepsis - resolved rapidly and now at baseline mental status   History of a spinal cord infarction w/ quadriplegia S/p diverting colostomy and suprapubic urinary cath   Acute kidney injury Baseline creatinine is 0.7 - creatinine at presentation 1.73 - crt has normalized w/ volume expansion   Chronic anemia Follow Hgb trend - stable at present   Severe Protein calorie malnutrition  Code Status: FULL Family Communication: mother Disposition Plan:   Consultants: ID  Procedures: none  Antibiotics: Aztreonam 3/31 Vanc 3/31 > 4/6 Diflucan 4/01 > tigecycline 4/6 --  HPI/Subjective: No new issues per nursing  Objective: Blood pressure 102/70, pulse 84, temperature 99 F (37.2 C), temperature source Oral, resp. rate 14, height 5\' 8"  (1.727 m), weight 57.6 kg (126 lb 15.8 oz), SpO2 100 %.  Intake/Output Summary (Last 24 hours) at 01/12/15 1508 Last data filed at 01/12/15 1117  Gross per 24 hour  Intake   1252 ml  Output   2750 ml  Net  -1498 ml   Exam: General: sleepy after fentanyl Lungs: Clear to auscultation bilaterally without wheezes or crackles Cardiovascular: Regular rate and rhythm without murmur gallop or rub  Abdomen: Nontender, nondistended, soft, bowel sounds positive, no rebound Extremities: No significant cyanosis, clubbing, or edema bilateral lower extremities  Data Reviewed: Basic Metabolic Panel:  Recent Labs Lab 01/06/15 1942  01/07/15 0440 01/07/15 1355 01/08/15 0400 01/09/15 0342 01/12/15 0505  NA 133*  < > 138 134* 139 138 130*  K 4.1  < > 3.3* 3.7 4.0 3.9 4.3  CL 109  < >  113* 111 117* 113* 101  CO2 17*  < > 23 20 22 19 23   GLUCOSE 469*  < > 108* 223* 88 125* 337*  BUN 22  < > 19 16 12 10 10   CREATININE 1.45*  < > 1.07 0.81 0.59 0.60 0.72  CALCIUM 9.4  < > 9.5  9.5 9.7 9.7 9.6  MG 1.6  --   --   --   --   --   --   PHOS 4.5  --   --   --   --   --   --   < > = values in this interval not displayed. Liver Function Tests:  Recent Labs Lab 01/06/15 1015 01/08/15 0400 01/09/15 0342  AST 8 9 11   ALT 10 8 8   ALKPHOS 93 72 74  BILITOT 0.5 <0.1* 0.3  PROT 7.3 6.5 6.6  ALBUMIN 2.2* 1.9* 1.9*    Recent Labs Lab 01/06/15 1015  AMMONIA 39*   CBC:  Recent Labs Lab 01/06/15 1015 01/08/15 0400 01/09/15 0342  WBC 18.8* 13.3* 17.7*  HGB 8.1* 8.2* 8.4*  HCT 26.8* 25.5* 26.8*  MCV 86.5 83.3 83.5  PLT 608* 660* 599*    CBG:  Recent Labs Lab 01/11/15 0858 01/11/15 1131 01/11/15 1630 01/11/15 2038 01/12/15 1315  GLUCAP 215* 194* 148* 155* 289*    Recent Results (from the past 240 hour(s))  Blood culture (routine x 2)     Status: None   Collection Time: 01/06/15 10:00 AM  Result Value Ref Range Status   Specimen Description BLOOD LEFT ARM  Final   Special Requests BOTTLES DRAWN AEROBIC AND ANAEROBIC 10ML  Final   Culture   Final    NO GROWTH 5 DAYS Performed at [facility]    Report Status 01/12/2015 FINAL  Final  Blood culture (routine x 2)     Status: None   Collection Time: 01/06/15 10:15 AM  Result Value Ref Range Status   Specimen Description BLOOD LEFT HAND  Final   Special Requests   Final    BOTTLES DRAWN AEROBIC AND ANAEROBIC 10ML AEROBIC 7ML ANAEROBIC   Culture   Final    NO GROWTH 5 DAYS Performed at [facility]    Report Status 01/12/2015 FINAL  Final  Clostridium Difficile by PCR     Status: None   Collection Time: 01/06/15 10:46 AM  Result Value Ref Range Status   C difficile by pcr NEGATIVE NEGATIVE Final  Stool culture     Status: None   Collection Time: 01/06/15 10:46 AM  Result Value Ref Range Status   Specimen Description STOOL  Final   Special Requests Immunocompromised  Final   Culture   Final    NO SALMONELLA, SHIGELLA, CAMPYLOBACTER, YERSINIA, OR E.COLI 0157:H7  ISOLATED Performed at [facility]    Report Status 01/10/2015 FINAL  Final  Urine culture     Status: None   Collection Time: 01/06/15 10:56 AM  Result Value Ref Range Status   Specimen Description URINE, CATHETERIZED  Final   Special Requests Normal  Final   Colony Count   Final    >=100,000 COLONIES/ML Performed at [place] Performed at [facility]   Final   Report Status 01/09/2015 FINAL  Final  Blood Cultures x 2 sites     Status: None (Preliminary result)   Collection Time: 01/10/15 11:28 AM  Result Value Ref Range Status   Specimen  Description BLOOD LEFT ANTECUBITAL  Final   Special Requests BOTTLES DRAWN AEROBIC AND ANAEROBIC 10CC  Final   Culture   Final           BLOOD CULTURE RECEIVED NO GROWTH TO DATE CULTURE WILL BE HELD FOR 5 DAYS BEFORE ISSUING A FINAL NEGATIVE REPORT Performed at [facility]    Report Status PENDING  Incomplete  Blood Cultures x 2 sites     Status: None (Preliminary result)   Collection Time: 01/10/15 11:41 AM  Result Value Ref Range Status   Specimen Description BLOOD LEFT HAND  Final   Special Requests BOTTLES DRAWN AEROBIC ONLY 4CC  Final   Culture   Final           BLOOD CULTURE RECEIVED NO GROWTH TO DATE CULTURE WILL BE HELD FOR 5 DAYS BEFORE ISSUING A FINAL NEGATIVE REPORT Performed at [facility]    Report Status PENDING  Incomplete     Studies:  Recent x-ray studies have been reviewed in detail by the Attending Physician  Scheduled Meds:  Scheduled Meds: . aztreonam  1 g Intravenous 3 times per day  . baclofen  10 mg Oral BID WC  . baclofen  20 mg Oral QHS  . collagenase   Topical Daily  . enoxaparin (LOVENOX) injection  40 mg Subcutaneous Q24H  . feeding supplement (GLUCERNA SHAKE)  237 mL Oral TID BM  . feeding supplement (PRO-STAT SUGAR FREE 64)  30 mL Oral BID  . fentaNYL  25 mcg Transdermal Q72H  . fluconazole (DIFLUCAN) IV  200 mg Intravenous Q24H  .  insulin aspart  3 Units Subcutaneous TID WC  . insulin glargine  8 Units Subcutaneous QHS  . loratadine  10 mg Oral Daily  . oxyCODONE-acetaminophen  1 tablet Oral TID PC   And  . oxyCODONE  5 mg Oral TID PC  . pantoprazole  40 mg Oral Daily  . potassium chloride SA  20 mEq Oral Daily  . pregabalin  100 mg Oral TID  . pregabalin  200 mg Oral Q2200  . QUEtiapine  25 mg Oral TID  . tigecycline (TYGACIL) IVPB  50 mg Intravenous Q12H    Time spent on care of this patient: 15 mins   Delfina Redwood , MD  [facility]  [email or web address] Password [email or web address] 01/12/2015, 3:08 PM   LOS: 6 days

## 2015-01-12 NOTE — Progress Notes (Signed)
ANTIBIOTIC CONSULT NOTE - FOLLOW UP  Pharmacy Consult for Aztreonam, Fluconazole Indication: Hx of sepsis, aspiration PNA, sacral decubital/ischial osteomyselitis, R/O UTI/Yeast  Allergies  Allergen Reactions  . Cefuroxime Axetil Anaphylaxis  . Ertapenem Other (See Comments)    Rash and confusion  . Morphine And Related Other (See Comments)    Changed mental status, confusion, headache, visual hallucination  . Penicillins Anaphylaxis and Other (See Comments)    ?can take amoxicillin?  . Sulfa Antibiotics Anaphylaxis, Shortness Of Breath and Other (See Comments)  . Tessalon [Benzonatate] Anaphylaxis  . Shellfish Allergy Itching and Other (See Comments)    Took benadryl to alleviate reaction    Patient Measurements: Height: 5\' 8"  (172.7 cm) Weight: 126 lb 15.8 oz (57.6 kg) IBW/kg (Calculated) : 68.4  Vital Signs: Temp: 99.5 F (37.5 C) (04/06 0900) Temp Source: Oral (04/06 0900) BP: 94/60 mmHg (04/06 0900) Pulse Rate: 84 (04/06 0900) Intake/Output from previous day: 04/05 0701 - 04/06 0700 In: 1102 [P.O.:1062] Out: 3250 [Urine:2850; Stool:400] Intake/Output from this shift: Total I/O In: 270 [P.O.:270] Out: 500 [Urine:500]  Labs:  Recent Labs  01/12/15 0505  CREATININE 0.72   Estimated Creatinine Clearance: 110 mL/min (by C-G formula based on Cr of 0.72). No results for input(s): VANCOTROUGH, VANCOPEAK, VANCORANDOM, GENTTROUGH, GENTPEAK, GENTRANDOM, TOBRATROUGH, TOBRAPEAK, TOBRARND, AMIKACINPEAK, AMIKACINTROU, AMIKACIN in the last 72 hours.   Microbiology: Recent Results (from the past 720 hour(s))  Blood culture (routine x 2)     Status: None   Collection Time: 01/06/15 10:00 AM  Result Value Ref Range Status   Specimen Description BLOOD LEFT ARM  Final   Special Requests BOTTLES DRAWN AEROBIC AND ANAEROBIC 10ML  Final   Culture   Final    NO GROWTH 5 DAYS Performed at [facility]    Report Status 01/12/2015 FINAL  Final  Blood culture (routine x  2)     Status: None   Collection Time: 01/06/15 10:15 AM  Result Value Ref Range Status   Specimen Description BLOOD LEFT HAND  Final   Special Requests   Final    BOTTLES DRAWN AEROBIC AND ANAEROBIC 10ML AEROBIC 7ML ANAEROBIC   Culture   Final    NO GROWTH 5 DAYS Performed at [facility]    Report Status 01/12/2015 FINAL  Final  Clostridium Difficile by PCR     Status: None   Collection Time: 01/06/15 10:46 AM  Result Value Ref Range Status   C difficile by pcr NEGATIVE NEGATIVE Final  Stool culture     Status: None   Collection Time: 01/06/15 10:46 AM  Result Value Ref Range Status   Specimen Description STOOL  Final   Special Requests Immunocompromised  Final   Culture   Final    NO SALMONELLA, SHIGELLA, CAMPYLOBACTER, YERSINIA, OR E.COLI 0157:H7 ISOLATED Performed at [facility]    Report Status 01/10/2015 FINAL  Final  Urine culture     Status: None   Collection Time: 01/06/15 10:56 AM  Result Value Ref Range Status   Specimen Description URINE, CATHETERIZED  Final   Special Requests Normal  Final   Colony Count   Final    >=100,000 COLONIES/ML Performed at [place] Performed at [facility]   Final   Report Status 01/09/2015 FINAL  Final  Blood Cultures x 2 sites     Status: None (Preliminary result)   Collection Time: 01/10/15 11:28 AM  Result Value Ref Range Status  Specimen Description BLOOD LEFT ANTECUBITAL  Final   Special Requests BOTTLES DRAWN AEROBIC AND ANAEROBIC 10CC  Final   Culture   Final           BLOOD CULTURE RECEIVED NO GROWTH TO DATE CULTURE WILL BE HELD FOR 5 DAYS BEFORE ISSUING A FINAL NEGATIVE REPORT Performed at [facility]    Report Status PENDING  Incomplete  Blood Cultures x 2 sites     Status: None (Preliminary result)   Collection Time: 01/10/15 11:41 AM  Result Value Ref Range Status   Specimen Description BLOOD LEFT HAND  Final   Special Requests BOTTLES DRAWN  AEROBIC ONLY 4CC  Final   Culture   Final           BLOOD CULTURE RECEIVED NO GROWTH TO DATE CULTURE WILL BE HELD FOR 5 DAYS BEFORE ISSUING A FINAL NEGATIVE REPORT Performed at [facility]    Report Status PENDING  Incomplete    Anti-infectives    Start     Dose/Rate Route Frequency Ordered Stop   01/12/15 2200  tigecycline (TYGACIL) 50 mg in sodium chloride 0.9 % 100 mL IVPB     50 mg 200 mL/hr over 30 Minutes Intravenous Every 12 hours 01/12/15 0921     01/12/15 1000  tigecycline (TYGACIL) 100 mg in sodium chloride 0.9 % 100 mL IVPB     100 mg 200 mL/hr over 30 Minutes Intravenous  Once 01/12/15 0921 01/12/15 1139   01/09/15 1400  metroNIDAZOLE (FLAGYL) IVPB 500 mg  Status:  Discontinued     500 mg 100 mL/hr over 60 Minutes Intravenous Every 8 hours 01/09/15 1312 01/12/15 0921   01/07/15 0400  vancomycin (VANCOCIN) 500 mg in sodium chloride 0.9 % 100 mL IVPB  Status:  Discontinued     500 mg 100 mL/hr over 60 Minutes Intravenous Every 12 hours 01/06/15 1738 01/11/15 0958   01/06/15 1600  fluconazole (DIFLUCAN) IVPB 200 mg     200 mg 100 mL/hr over 60 Minutes Intravenous Every 24 hours 01/06/15 1521     01/06/15 1600  vancomycin (VANCOCIN) IVPB 1000 mg/200 mL premix     1,000 mg 200 mL/hr over 60 Minutes Intravenous  Once 01/06/15 1521 01/06/15 1722   01/06/15 1515  vancomycin (VANCOCIN) IVPB 1000 mg/200 mL premix  Status:  Discontinued     1,000 mg 200 mL/hr over 60 Minutes Intravenous Every 24 hours 01/06/15 1514 01/06/15 1515   01/06/15 1300  aztreonam (AZACTAM) 1 g in dextrose 5 % 50 mL IVPB     1 g 100 mL/hr over 30 Minutes Intravenous 3 times per day 01/06/15 1227        Assessment: 35 yoM with spastic quadriplegia continuing on Aztreonam, and Fluconazole for sacral decubitus ulcerations and chronic osteomyelitis s/p multiple debridements and multiple courses of antibiotics. . ID switched patient from Vancomycin to Tigecycline since patient had CT with contrast  yesterday. Patient remains febrile (Tmax 101.5), WBC 17.7. SCr remains stable at 0.72.   Azactam 3/18>>3/27 (pta); resume 3/31>> Flucon IV 3/31>> Vanc 3/18>>3/27  (pta)>> resume 3/31>> (4/3 VT 19) Santyl as pta  3/31 blood x 2 - NEG 3/31 C diff NEG 3/31 urine - yeast 3/31 Stool - NEG 4/4 BCx x2>> NGTD   Goal of Therapy:  Resolution of infection   Plan:  -Continue Aztreonam 1 Gm IV q8h -Continue Fluconazole 200 mg IV q24h -Tigecycline 100 mg IV bolus followed by 50 mg IV Q 12 hours per ID  -  Monitor blood cultures, sensitivities and renal function   Albertina Parr, PharmD., BCPS Clinical Pharmacist Pager [phone]

## 2015-01-12 NOTE — Progress Notes (Signed)
[place] for Infectious Disease    Subjective: Developed bullous skin lesion near his ostomy bag overnight and Mother called me hospital operator to report this  Antibiotics:  Anti-infectives    Start     Dose/Rate Route Frequency Ordered Stop   01/12/15 2200  tigecycline (TYGACIL) 50 mg in sodium chloride 0.9 % 100 mL IVPB     50 mg 200 mL/hr over 30 Minutes Intravenous Every 12 hours 01/12/15 0921     01/12/15 1000  tigecycline (TYGACIL) 100 mg in sodium chloride 0.9 % 100 mL IVPB     100 mg 200 mL/hr over 30 Minutes Intravenous  Once 01/12/15 0921 01/12/15 1139   01/09/15 1400  metroNIDAZOLE (FLAGYL) IVPB 500 mg  Status:  Discontinued     500 mg 100 mL/hr over 60 Minutes Intravenous Every 8 hours 01/09/15 1312 01/12/15 0921   01/07/15 0400  vancomycin (VANCOCIN) 500 mg in sodium chloride 0.9 % 100 mL IVPB  Status:  Discontinued     500 mg 100 mL/hr over 60 Minutes Intravenous Every 12 hours 01/06/15 1738 01/11/15 0958   01/06/15 1600  fluconazole (DIFLUCAN) IVPB 200 mg     200 mg 100 mL/hr over 60 Minutes Intravenous Every 24 hours 01/06/15 1521     01/06/15 1600  vancomycin (VANCOCIN) IVPB 1000 mg/200 mL premix     1,000 mg 200 mL/hr over 60 Minutes Intravenous  Once 01/06/15 1521 01/06/15 1722   01/06/15 1515  vancomycin (VANCOCIN) IVPB 1000 mg/200 mL premix  Status:  Discontinued     1,000 mg 200 mL/hr over 60 Minutes Intravenous Every 24 hours 01/06/15 1514 01/06/15 1515   01/06/15 1300  aztreonam (AZACTAM) 1 g in dextrose 5 % 50 mL IVPB     1 g 100 mL/hr over 30 Minutes Intravenous 3 times per day 01/06/15 1227        Medications: Scheduled Meds: . aztreonam  1 g Intravenous 3 times per day  . baclofen  10 mg Oral BID WC  . baclofen  20 mg Oral QHS  . collagenase   Topical Daily  . enoxaparin (LOVENOX) injection  40 mg Subcutaneous Q24H  . feeding supplement (GLUCERNA SHAKE)  237 mL Oral TID BM  . feeding supplement (PRO-STAT SUGAR FREE 64)  30 mL Oral  BID  . fentaNYL  25 mcg Transdermal Q72H  . fluconazole (DIFLUCAN) IV  200 mg Intravenous Q24H  . insulin aspart  5 Units Subcutaneous TID WC  . insulin glargine  12 Units Subcutaneous QHS  . loratadine  10 mg Oral Daily  . oxyCODONE-acetaminophen  1 tablet Oral TID PC   And  . oxyCODONE  5 mg Oral TID PC  . pantoprazole  40 mg Oral Daily  . potassium chloride SA  20 mEq Oral Daily  . pregabalin  100 mg Oral TID  . pregabalin  200 mg Oral Q2200  . QUEtiapine  25 mg Oral TID  . tigecycline (TYGACIL) IVPB  50 mg Intravenous Q12H   Continuous Infusions: . sodium chloride 20 mL (01/11/15 0554)   PRN Meds:.acetaminophen, alum & mag hydroxide-simeth, fentaNYL, midodrine, sodium chloride, sodium chloride    Objective: Weight change:   Intake/Output Summary (Last 24 hours) at 01/12/15 2058 Last data filed at 01/12/15 1117  Gross per 24 hour  Intake    790 ml  Output   1700 ml  Net   -910 ml   Blood pressure 89/58, pulse 84, temperature 100 F (37.8 C), temperature  source Oral, resp. rate 16, height 5\' 8"  (1.727 m), weight 126 lb 15.8 oz (57.6 kg), SpO2 97 %. Temp:  [99 F (37.2 C)-100.6 F (38.1 C)] 100 F (37.8 C) (04/06 1700) Pulse Rate:  [84-85] 84 (04/06 0900) Resp:  [14-17] 16 (04/06 1700) BP: (89-102)/(58-70) 89/58 mmHg (04/06 1700) SpO2:  [97 %-100 %] 97 % (04/06 1700)  Physical Exam: General: Alert and awake, oriented x3, not in any acute distress. HEENT: anicteric sclera, pupils reactive to light and accommodation, EOMI, oropharynx clear and without exudate CVS regular rate, normal r, no murmur rubs or gallops Chest: clear to auscultation bilaterally, no wheezing, rales or rhonchi Abdomen: soft nontender, ostomy clean and suprapubic catheter clean, there is bullous area near ostomy bag and some exfoliation of skin around this area  01/12/15:           01/10/15:        Neuro: ;paraplegic CBC: CBC Latest Ref Rng 01/09/2015 01/08/2015 01/06/2015  WBC 4.0  - 10.5 K/uL 17.7(H) 13.3(H) 18.8(H)  Hemoglobin 13.0 - 17.0 g/dL 8.4(L) 8.2(L) 8.1(L)  Hematocrit 39.0 - 52.0 % 26.8(L) 25.5(L) 26.8(L)  Platelets 150 - 400 K/uL 599(H) 660(H) 608(H)       BMET  Recent Labs  01/12/15 0505  NA 130*  K 4.3  CL 101  CO2 23  GLUCOSE 337*  BUN 10  CREATININE 0.72  CALCIUM 9.6     Liver Panel  No results for input(s): PROT, ALBUMIN, AST, ALT, ALKPHOS, BILITOT, BILIDIR, IBILI in the last 72 hours.     Sedimentation Rate No results for input(s): ESRSEDRATE in the last 72 hours. C-Reactive Protein No results for input(s): CRP in the last 72 hours.  Micro Results: Recent Results (from the past 720 hour(s))  Blood culture (routine x 2)     Status: None   Collection Time: 01/06/15 10:00 AM  Result Value Ref Range Status   Specimen Description BLOOD LEFT ARM  Final   Special Requests BOTTLES DRAWN AEROBIC AND ANAEROBIC 10ML  Final   Culture   Final    NO GROWTH 5 DAYS Performed at [facility]    Report Status 01/12/2015 FINAL  Final  Blood culture (routine x 2)     Status: None   Collection Time: 01/06/15 10:15 AM  Result Value Ref Range Status   Specimen Description BLOOD LEFT HAND  Final   Special Requests   Final    BOTTLES DRAWN AEROBIC AND ANAEROBIC 10ML AEROBIC 7ML ANAEROBIC   Culture   Final    NO GROWTH 5 DAYS Performed at [facility]    Report Status 01/12/2015 FINAL  Final  Clostridium Difficile by PCR     Status: None   Collection Time: 01/06/15 10:46 AM  Result Value Ref Range Status   C difficile by pcr NEGATIVE NEGATIVE Final  Stool culture     Status: None   Collection Time: 01/06/15 10:46 AM  Result Value Ref Range Status   Specimen Description STOOL  Final   Special Requests Immunocompromised  Final   Culture   Final    NO SALMONELLA, SHIGELLA, CAMPYLOBACTER, YERSINIA, OR E.COLI 0157:H7 ISOLATED Performed at [facility]    Report Status 01/10/2015 FINAL  Final  Urine culture      Status: None   Collection Time: 01/06/15 10:56 AM  Result Value Ref Range Status   Specimen Description URINE, CATHETERIZED  Final   Special Requests Normal  Final   Colony Count  Final    >=100,000 COLONIES/ML Performed at [place] Performed at [facility]   Final   Report Status 01/09/2015 FINAL  Final  Blood Cultures x 2 sites     Status: None (Preliminary result)   Collection Time: 01/10/15 11:28 AM  Result Value Ref Range Status   Specimen Description BLOOD LEFT ANTECUBITAL  Final   Special Requests BOTTLES DRAWN AEROBIC AND ANAEROBIC 10CC  Final   Culture   Final           BLOOD CULTURE RECEIVED NO GROWTH TO DATE CULTURE WILL BE HELD FOR 5 DAYS BEFORE ISSUING A FINAL NEGATIVE REPORT Performed at [facility]    Report Status PENDING  Incomplete  Blood Cultures x 2 sites     Status: None (Preliminary result)   Collection Time: 01/10/15 11:41 AM  Result Value Ref Range Status   Specimen Description BLOOD LEFT HAND  Final   Special Requests BOTTLES DRAWN AEROBIC ONLY 4CC  Final   Culture   Final           BLOOD CULTURE RECEIVED NO GROWTH TO DATE CULTURE WILL BE HELD FOR 5 DAYS BEFORE ISSUING A FINAL NEGATIVE REPORT Performed at [facility]    Report Status PENDING  Incomplete    Studies/Results: Ct Chest W Contrast  01/11/2015   CLINICAL DATA:  Fever of unknown origin  EXAM: CT CHEST, ABDOMEN, AND PELVIS WITH CONTRAST  TECHNIQUE: Multidetector CT imaging of the chest, abdomen and pelvis was performed following the standard protocol during bolus administration of intravenous contrast.  CONTRAST:  15mL OMNIPAQUE IOHEXOL 300 MG/ML  SOLN  COMPARISON:  None.  FINDINGS: CT CHEST FINDINGS  The lungs are well aerated bilaterally. Patchy pneumonia is identified in the lower lobes similar to that seen on the prior plain film examination. The upper lobes are relatively spared.  The thoracic inlet is within normal limits. A  right-sided PICC line is noted in satisfactory position. The thoracic aorta and pulmonary artery are within normal limits. Scattered small hilar and mediastinal lymph nodes are seen likely of reactive nature these are most prominent in the aorticopulmonary window.  No acute bony abnormality is noted.  CT ABDOMEN AND PELVIS FINDINGS  The liver, gallbladder, spleen,, adrenal glands and pancreas are normal in their CT appearance. Left kidney is well visualized. No renal calculi or obstructive changes are noted. Right kidney again demonstrates some scarring medially stable from the prior exam.  The bladder wall is thickened and enhanced but stable from the prior exam. The bladder is partially decompressed by Foley catheter. Some air is noted within the bladder related to the recent instrumentation.  The appendix is within normal limits. No pelvic mass lesion is noted. Decubitus ulcers are noted over the sacrum as well as over the right ischial tuberosity. The ischial tuberosity decubitus ulcer appears more prominent and this may be related to recent debridement. Underlying bony erosion is noted in the ischial tuberosity which is more prominent than that seen on the prior exam consistent with underlying osteomyelitis.  IMPRESSION: Bilateral lower lobe pneumonia which is increased in the interval from the prior exam.  Thickening of the bladder wall which is stable from the prior study.  Decubitus ulcers with changes of osteomyelitis within the right ischium posteriorly. The degree of bony erosion appears to have progressed in the interval from the prior study.   Electronically Signed   By: Linus Mako.D.  On: 01/11/2015 15:43   Ct Abdomen Pelvis W Contrast  01/11/2015   CLINICAL DATA:  Fever of unknown origin  EXAM: CT CHEST, ABDOMEN, AND PELVIS WITH CONTRAST  TECHNIQUE: Multidetector CT imaging of the chest, abdomen and pelvis was performed following the standard protocol during bolus administration of intravenous  contrast.  CONTRAST:  42mL OMNIPAQUE IOHEXOL 300 MG/ML  SOLN  COMPARISON:  None.  FINDINGS: CT CHEST FINDINGS  The lungs are well aerated bilaterally. Patchy pneumonia is identified in the lower lobes similar to that seen on the prior plain film examination. The upper lobes are relatively spared.  The thoracic inlet is within normal limits. A right-sided PICC line is noted in satisfactory position. The thoracic aorta and pulmonary artery are within normal limits. Scattered small hilar and mediastinal lymph nodes are seen likely of reactive nature these are most prominent in the aorticopulmonary window.  No acute bony abnormality is noted.  CT ABDOMEN AND PELVIS FINDINGS  The liver, gallbladder, spleen,, adrenal glands and pancreas are normal in their CT appearance. Left kidney is well visualized. No renal calculi or obstructive changes are noted. Right kidney again demonstrates some scarring medially stable from the prior exam.  The bladder wall is thickened and enhanced but stable from the prior exam. The bladder is partially decompressed by Foley catheter. Some air is noted within the bladder related to the recent instrumentation.  The appendix is within normal limits. No pelvic mass lesion is noted. Decubitus ulcers are noted over the sacrum as well as over the right ischial tuberosity. The ischial tuberosity decubitus ulcer appears more prominent and this may be related to recent debridement. Underlying bony erosion is noted in the ischial tuberosity which is more prominent than that seen on the prior exam consistent with underlying osteomyelitis.  IMPRESSION: Bilateral lower lobe pneumonia which is increased in the interval from the prior exam.  Thickening of the bladder wall which is stable from the prior study.  Decubitus ulcers with changes of osteomyelitis within the right ischium posteriorly. The degree of bony erosion appears to have progressed in the interval from the prior study.   Electronically Signed    By: Inez Catalina M.D.   On: 01/11/2015 15:43   Mri Left Foot Without Contrast  01/11/2015   CLINICAL DATA:  Diabetic foot ulcers  EXAM: MRI OF THE LEFT FOREFOOT WITHOUT CONTRAST  TECHNIQUE: Multiplanar, multisequence MR imaging was performed. No intravenous contrast was administered.  COMPARISON:  11/17/2014 radiographs hand prior MRI 09/16/2014  FINDINGS: No definite MR findings to suggest septic arthritis or osteomyelitis. No significant changes of cellulitis. There is diffuse edema like signal abnormality in the short flexor muscles of the foot suggesting myositis. No focal fluid collections to suggest a drainable abscess.  IMPRESSION: No definite MR findings for septic arthritis or osteomyelitis.  No cellulitis or drainable soft tissue abscess.  Diffuse myositis.   Electronically Signed   By: Marijo Sanes M.D.   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 male male with paraplegia chronic sacral decubitus ulcerations and chronic osteomyelitis status post multiple debridements by Dr. Migdalia Dk multiple courses of antibiotics who continues with the another cycle of admission for "sepsis".  #1 Sepsis: I changed his abx to Tygacil this am for his worsening infiltrates on aztreonam, flagyl and vancoycin (note pseudomonas not covered now) with his fluconazole  He mentions that he had H pylori and indeed this was diagnosed in 2014 by Dr. Hilarie Fredrickson  Unfortunately pt is on PPI so we cannot get a stool ag that will be reliably and  antibodies are useless post prior infection  Could ask GI to endoscope to make a diagnosis vs therapeutic trial if al other maneuver fail  #2 Funguria: suprapubic catheter chagned again and on fluconazole  #3 Rash with bullous area near his ostomy bag: not clear if this is related to his prior abx, new ones or not related to abx, would observe on tygacil and fluconazole alone for now  Dr. Johnnye Sima covering the service Th thru weekend.    LOS: 6 days   [name] 01/12/2015, 8:58 PM

## 2015-01-12 NOTE — Progress Notes (Signed)
Inpatient Diabetes Program Recommendations  AACE/ADA: New Consensus Statement on Inpatient Glycemic Control (2013)  Target Ranges:  Prepandial:   less than 140 mg/dL      Peak postprandial:   less than 180 mg/dL (1-2 hours)      Critically ill patients:  140 - 180 mg/dL   Consider adding Novolog sensitive scale TID per Glycemic Control Order set.  Thank you  Raoul Pitch BSN, RN,CDE Inpatient Diabetes Coordinator [phone] (team pager)

## 2015-01-13 ENCOUNTER — Inpatient Hospital Stay: Payer: Self-pay | Admitting: Internal Medicine

## 2015-01-13 DIAGNOSIS — B3741 Candidal cystitis and urethritis: Secondary | ICD-10-CM

## 2015-01-13 DIAGNOSIS — J189 Pneumonia, unspecified organism: Secondary | ICD-10-CM

## 2015-01-13 DIAGNOSIS — M8618 Other acute osteomyelitis, other site: Secondary | ICD-10-CM

## 2015-01-13 DIAGNOSIS — L89154 Pressure ulcer of sacral region, stage 4: Secondary | ICD-10-CM

## 2015-01-13 DIAGNOSIS — G9511 Acute infarction of spinal cord (embolic) (nonembolic): Secondary | ICD-10-CM

## 2015-01-13 DIAGNOSIS — E109 Type 1 diabetes mellitus without complications: Secondary | ICD-10-CM

## 2015-01-13 LAB — GLUCOSE, CAPILLARY
GLUCOSE-CAPILLARY: 221 mg/dL — AB (ref 70–99)
GLUCOSE-CAPILLARY: 277 mg/dL — AB (ref 70–99)
Glucose-Capillary: 227 mg/dL — ABNORMAL HIGH (ref 70–99)
Glucose-Capillary: 143 mg/dL — ABNORMAL HIGH (ref 70–99)

## 2015-01-13 LAB — CBC
HCT: 26.3 % — ABNORMAL LOW (ref 39.0–52.0)
Hemoglobin: 8.3 g/dL — ABNORMAL LOW (ref 13.0–17.0)
MCH: 26.4 pg (ref 26.0–34.0)
MCHC: 31.6 g/dL (ref 30.0–36.0)
MCV: 83.8 fL (ref 78.0–100.0)
Platelets: 511 10*3/uL — ABNORMAL HIGH (ref 150–400)
RBC: 3.14 MIL/uL — ABNORMAL LOW (ref 4.22–5.81)
RDW: 15.9 % — AB (ref 11.5–15.5)
WBC: 13.7 10*3/uL — ABNORMAL HIGH (ref 4.0–10.5)

## 2015-01-13 LAB — AMYLASE: AMYLASE: 21 U/L (ref 0–105)

## 2015-01-13 LAB — LIPASE, BLOOD: Lipase: 20 U/L (ref 11–59)

## 2015-01-13 MED ORDER — INSULIN GLARGINE 100 UNIT/ML ~~LOC~~ SOLN
15.0000 [IU] | Freq: Every day | SUBCUTANEOUS | Status: DC
  Administered 2015-01-14: 15 [IU] via SUBCUTANEOUS
  Filled 2015-01-13 (×2): qty 0.15

## 2015-01-13 NOTE — Progress Notes (Signed)
INFECTIOUS DISEASE PROGRESS NOTE  ID: Jason [name] is a 29 y.o. male with  Principal Problem:   Sepsis Active Problems:   IDDM (insulin dependent diabetes mellitus)   Spinal cord infarction (history of)   Hypoglycemia   Acute encephalopathy   UTI (lower urinary tract infection)   Stage IV decubitus ulcer   AKI (acute kidney injury)   Suprapubic catheter   DKA (diabetic ketoacidoses)   Allergy to multiple antibiotics   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   Acidosis   FUO (fever of unknown origin)   HCAP (healthcare-associated pneumonia)   Bullous skin disease  Subjective: Without complaints  Abtx:  Anti-infectives    Start     Dose/Rate Route Frequency Ordered Stop   01/12/15 2200  tigecycline (TYGACIL) 50 mg in sodium chloride 0.9 % 100 mL IVPB     50 mg 200 mL/hr over 30 Minutes Intravenous Every 12 hours 01/12/15 0921     01/12/15 1000  tigecycline (TYGACIL) 100 mg in sodium chloride 0.9 % 100 mL IVPB     100 mg 200 mL/hr over 30 Minutes Intravenous  Once 01/12/15 0921 01/12/15 1139   01/09/15 1400  metroNIDAZOLE (FLAGYL) IVPB 500 mg  Status:  Discontinued     500 mg 100 mL/hr over 60 Minutes Intravenous Every 8 hours 01/09/15 1312 01/12/15 0921   01/07/15 0400  vancomycin (VANCOCIN) 500 mg in sodium chloride 0.9 % 100 mL IVPB  Status:  Discontinued     500 mg 100 mL/hr over 60 Minutes Intravenous Every 12 hours 01/06/15 1738 01/11/15 0958   01/06/15 1600  fluconazole (DIFLUCAN) IVPB 200 mg     200 mg 100 mL/hr over 60 Minutes Intravenous Every 24 hours 01/06/15 1521     01/06/15 1600  vancomycin (VANCOCIN) IVPB 1000 mg/200 mL premix     1,000 mg 200 mL/hr over 60 Minutes Intravenous  Once 01/06/15 1521 01/06/15 1722   01/06/15 1515  vancomycin (VANCOCIN) IVPB 1000 mg/200 mL premix  Status:  Discontinued     1,000 mg 200 mL/hr over 60 Minutes Intravenous Every 24 hours 01/06/15 1514 01/06/15 1515   01/06/15 1300  aztreonam  (AZACTAM) 1 g in dextrose 5 % 50 mL IVPB  Status:  Discontinued     1 g 100 mL/hr over 30 Minutes Intravenous 3 times per day 01/06/15 1227 01/12/15 2104      Medications:  Scheduled: . baclofen  10 mg Oral BID WC  . baclofen  20 mg Oral QHS  . collagenase   Topical Daily  . enoxaparin (LOVENOX) injection  40 mg Subcutaneous Q24H  . feeding supplement (GLUCERNA SHAKE)  237 mL Oral TID BM  . feeding supplement (PRO-STAT SUGAR FREE 64)  30 mL Oral BID  . fentaNYL  25 mcg Transdermal Q72H  . fluconazole (DIFLUCAN) IV  200 mg Intravenous Q24H  . insulin aspart  5 Units Subcutaneous TID WC  . insulin glargine  15 Units Subcutaneous QHS  . loratadine  10 mg Oral Daily  . oxyCODONE-acetaminophen  1 tablet Oral TID PC   And  . oxyCODONE  5 mg Oral TID PC  . pantoprazole  40 mg Oral Daily  . potassium chloride SA  20 mEq Oral Daily  . pregabalin  100 mg Oral TID  . pregabalin  200 mg Oral Q2200  . QUEtiapine  25 mg Oral TID  . tigecycline (TYGACIL) IVPB  50 mg Intravenous Q12H    Objective:  Vital signs in last 24 hours: Temp:  [98.7 F (37.1 C)-101 F (38.3 C)] 98.7 F (37.1 C) (04/07 1319) Pulse Rate:  [79-87] 79 (04/07 1319) Resp:  [14-16] 16 (04/07 1319) BP: (89-112)/(58-73) 109/73 mmHg (04/07 1319) SpO2:  [96 %-100 %] 100 % (04/07 1319) Weight:  [58.2 kg (128 lb 4.9 oz)] 58.2 kg (128 lb 4.9 oz) (04/06 2058)   General appearance: alert, cooperative and no distress Resp: clear to auscultation bilaterally Cardio: regular rate and rhythm GI: normal findings: bowel sounds normal, soft, non-tender and foley site is clean, non-tender and sacral wound is dressed.  Extremities: no cordis felt in UE  Lab Results  Recent Labs  01/12/15 0505 01/13/15 0515  WBC  --  13.7*  HGB  --  8.3*  HCT  --  26.3*  NA 130*  --   K 4.3  --   CL 101  --   CO2 23  --   BUN 10  --   CREATININE 0.72  --    Liver Panel No results for input(s): PROT, ALBUMIN, AST, ALT, ALKPHOS, BILITOT,  BILIDIR, IBILI in the last 72 hours. Sedimentation Rate No results for input(s): ESRSEDRATE in the last 72 hours. C-Reactive Protein No results for input(s): CRP in the last 72 hours.  Microbiology: Recent Results (from the past 240 hour(s))  Blood culture (routine x 2)     Status: None   Collection Time: 01/06/15 10:00 AM  Result Value Ref Range Status   Specimen Description BLOOD LEFT ARM  Final   Special Requests BOTTLES DRAWN AEROBIC AND ANAEROBIC 10ML  Final   Culture   Final    NO GROWTH 5 DAYS Performed at [facility]    Report Status 01/12/2015 FINAL  Final  Blood culture (routine x 2)     Status: None   Collection Time: 01/06/15 10:15 AM  Result Value Ref Range Status   Specimen Description BLOOD LEFT HAND  Final   Special Requests   Final    BOTTLES DRAWN AEROBIC AND ANAEROBIC 10ML AEROBIC 7ML ANAEROBIC   Culture   Final    NO GROWTH 5 DAYS Performed at [facility]    Report Status 01/12/2015 FINAL  Final  Clostridium Difficile by PCR     Status: None   Collection Time: 01/06/15 10:46 AM  Result Value Ref Range Status   C difficile by pcr NEGATIVE NEGATIVE Final  Stool culture     Status: None   Collection Time: 01/06/15 10:46 AM  Result Value Ref Range Status   Specimen Description STOOL  Final   Special Requests Immunocompromised  Final   Culture   Final    NO SALMONELLA, SHIGELLA, CAMPYLOBACTER, YERSINIA, OR E.COLI 0157:H7 ISOLATED Performed at [facility]    Report Status 01/10/2015 FINAL  Final  Urine culture     Status: None   Collection Time: 01/06/15 10:56 AM  Result Value Ref Range Status   Specimen Description URINE, CATHETERIZED  Final   Special Requests Normal  Final   Colony Count   Final    >=100,000 COLONIES/ML Performed at [place] Performed at [facility]   Final   Report Status 01/09/2015 FINAL  Final  Blood Cultures x 2 sites     Status: None (Preliminary result)    Collection Time: 01/10/15 11:28 AM  Result Value Ref Range Status   Specimen Description BLOOD LEFT ANTECUBITAL  Final   Special Requests  BOTTLES DRAWN AEROBIC AND ANAEROBIC 10CC  Final   Culture   Final           BLOOD CULTURE RECEIVED NO GROWTH TO DATE CULTURE WILL BE HELD FOR 5 DAYS BEFORE ISSUING A FINAL NEGATIVE REPORT Performed at [facility]    Report Status PENDING  Incomplete  Blood Cultures x 2 sites     Status: None (Preliminary result)   Collection Time: 01/10/15 11:41 AM  Result Value Ref Range Status   Specimen Description BLOOD LEFT HAND  Final   Special Requests BOTTLES DRAWN AEROBIC ONLY 4CC  Final   Culture   Final           BLOOD CULTURE RECEIVED NO GROWTH TO DATE CULTURE WILL BE HELD FOR 5 DAYS BEFORE ISSUING A FINAL NEGATIVE REPORT Performed at [facility]    Report Status PENDING  Incomplete    Studies/Results: Ct Chest W Contrast  01/11/2015   CLINICAL DATA:  Fever of unknown origin  EXAM: CT CHEST, ABDOMEN, AND PELVIS WITH CONTRAST  TECHNIQUE: Multidetector CT imaging of the chest, abdomen and pelvis was performed following the standard protocol during bolus administration of intravenous contrast.  CONTRAST:  63mL OMNIPAQUE IOHEXOL 300 MG/ML  SOLN  COMPARISON:  None.  FINDINGS: CT CHEST FINDINGS  The lungs are well aerated bilaterally. Patchy pneumonia is identified in the lower lobes similar to that seen on the prior plain film examination. The upper lobes are relatively spared.  The thoracic inlet is within normal limits. A right-sided PICC line is noted in satisfactory position. The thoracic aorta and pulmonary artery are within normal limits. Scattered small hilar and mediastinal lymph nodes are seen likely of reactive nature these are most prominent in the aorticopulmonary window.  No acute bony abnormality is noted.  CT ABDOMEN AND PELVIS FINDINGS  The liver, gallbladder, spleen,, adrenal glands and pancreas are normal in their CT appearance. Left  kidney is well visualized. No renal calculi or obstructive changes are noted. Right kidney again demonstrates some scarring medially stable from the prior exam.  The bladder wall is thickened and enhanced but stable from the prior exam. The bladder is partially decompressed by Foley catheter. Some air is noted within the bladder related to the recent instrumentation.  The appendix is within normal limits. No pelvic mass lesion is noted. Decubitus ulcers are noted over the sacrum as well as over the right ischial tuberosity. The ischial tuberosity decubitus ulcer appears more prominent and this may be related to recent debridement. Underlying bony erosion is noted in the ischial tuberosity which is more prominent than that seen on the prior exam consistent with underlying osteomyelitis.  IMPRESSION: Bilateral lower lobe pneumonia which is increased in the interval from the prior exam.  Thickening of the bladder wall which is stable from the prior study.  Decubitus ulcers with changes of osteomyelitis within the right ischium posteriorly. The degree of bony erosion appears to have progressed in the interval from the prior study.   Electronically Signed   By: Inez Catalina M.D.   On: 01/11/2015 15:43   Ct Abdomen Pelvis W Contrast  01/11/2015   CLINICAL DATA:  Fever of unknown origin  EXAM: CT CHEST, ABDOMEN, AND PELVIS WITH CONTRAST  TECHNIQUE: Multidetector CT imaging of the chest, abdomen and pelvis was performed following the standard protocol during bolus administration of intravenous contrast.  CONTRAST:  49mL OMNIPAQUE IOHEXOL 300 MG/ML  SOLN  COMPARISON:  None.  FINDINGS: CT CHEST FINDINGS  The lungs are well aerated bilaterally. Patchy pneumonia is identified in the lower lobes similar to that seen on the prior plain film examination. The upper lobes are relatively spared.  The thoracic inlet is within normal limits. A right-sided PICC line is noted in satisfactory position. The thoracic aorta and pulmonary  artery are within normal limits. Scattered small hilar and mediastinal lymph nodes are seen likely of reactive nature these are most prominent in the aorticopulmonary window.  No acute bony abnormality is noted.  CT ABDOMEN AND PELVIS FINDINGS  The liver, gallbladder, spleen,, adrenal glands and pancreas are normal in their CT appearance. Left kidney is well visualized. No renal calculi or obstructive changes are noted. Right kidney again demonstrates some scarring medially stable from the prior exam.  The bladder wall is thickened and enhanced but stable from the prior exam. The bladder is partially decompressed by Foley catheter. Some air is noted within the bladder related to the recent instrumentation.  The appendix is within normal limits. No pelvic mass lesion is noted. Decubitus ulcers are noted over the sacrum as well as over the right ischial tuberosity. The ischial tuberosity decubitus ulcer appears more prominent and this may be related to recent debridement. Underlying bony erosion is noted in the ischial tuberosity which is more prominent than that seen on the prior exam consistent with underlying osteomyelitis.  IMPRESSION: Bilateral lower lobe pneumonia which is increased in the interval from the prior exam.  Thickening of the bladder wall which is stable from the prior study.  Decubitus ulcers with changes of osteomyelitis within the right ischium posteriorly. The degree of bony erosion appears to have progressed in the interval from the prior study.   Electronically Signed   By: Inez Catalina M.D.   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  His C diff was (-) on admission.  ? dvt eval ? Drug fever (hopefully better on tygacil) Check amylase,  lipase         [name] Infectious Diseases (pager) [phone] [email or web address] 01/13/2015, 2:14 PM  LOS: 7 days

## 2015-01-13 NOTE — Progress Notes (Signed)
Inpatient Diabetes Program Recommendations  AACE/ADA: New Consensus Statement on Inpatient Glycemic Control (2013)  Target Ranges:  Prepandial:   less than 140 mg/dL      Peak postprandial:   less than 180 mg/dL (1-2 hours)      Critically ill patients:  140 - 180 mg/dL   Reason for Assessment:Results for ANDRIY, SHERK (MRN [id number]) as of 01/13/2015 12:39  Ref. Range 01/12/2015 13:15 01/12/2015 18:17 01/12/2015 20:57 01/13/2015 07:23 01/13/2015 11:34  Glucose-Capillary Latest Range: 70-99 mg/dL 289 (H) 232 (H) 272 (H) 221 (H) 277 (H)    Consider further increase of basal insulin to Lantus 16 units daily.  Thanks, Adah Perl, RN, BC-ADM Inpatient Diabetes Coordinator Pager [phone] (8a-5p)

## 2015-01-13 NOTE — Progress Notes (Signed)
Progress Note  Jason KINLEY ZWC:[id number] DOB: 1986-06-13 DOA: 01/06/2015 PCP: [name], MD  Admit HPI / Brief Narrative: 29 y.o. 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 Patient was discharged from the hospital on 12/01/2014 after being treated for severe sepsis, aspiration pneumonia, and multiple sacral decubitus ulcers with aztreonam, vancomycin and flagyl. IV antibiotics were finished on 3/27 and according to his mother since then he had been getting progressively worse, w/ fever of 101.5, lethargic and not eating.  In the ED patient received Narcan without significant change, his mother reported that he did not get any extra pain medications, he was hypothermic with temperature of 96.5, his WBC was 18.8, patient admitted to stepdown for further evaluation.  Assessment/Plan:  Sepsis  Continued fevers. bc neg. Urine cx with yeast. SP cath changed by urology. CT C/A/P shows bilateral pneumonia. Now on fluconazole and tigecycline. Home when afebrile  Stage IV sacral decubitus ulcer with ischial ulcers w/ osteomyelitis - various other skin wounds Patient was recently getting treated for ischial osteomyelitis with vancomycin, aztreonam and Flagyl - IV antibiotics finished on 01/02/2015 - MRI shows Underlying osteomyelitis within the ischium has mildly progressed compared with the MRI of 2 months ago. 2. No residual focal soft tissue abscess. 3. Generalized muscular edema and enhancement are similar to the prior examination. The subcutaneous edema has improved. 4. No significant hip findings. Viewed wounds with nursing today. No odor, no cellulitis. [place] sl[place]  UTI w/ indwelling suprapubic catheter / neurogenic bladder Urine culture with 100K yeast. See above. On diflucan  Early DKA - uncontrolled DM1, brittle Hypoglycemia  earlier this week, and insulin down titrated. CBGs now in the 200s. 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 Pt without complaints  Objective: Blood pressure 95/70, pulse 87, temperature 100.9 F (38.3 C), temperature source Oral, resp. rate 16, height 5\' 8"  (1.727 m), weight 58.2 kg (128 lb 4.9 oz), SpO2 99 %.  Intake/Output Summary (Last 24 hours) at 01/13/15 1251 Last data filed at 01/13/15 0839  Gross per 24 hour  Intake    702 ml  Output   2475 ml  Net  -1773 ml   Exam: General: alert. Eating lunch with assistance Lungs: Clear to auscultation bilaterally without wheezes or crackles Cardiovascular: Regular rate and rhythm without murmur gallop or rub  Abdomen: Nontender, nondistended, soft, bowel sounds positive, no rebound Extremities: No significant cyanosis, clubbing, or edema bilateral lower extremities  Data Reviewed: Basic Metabolic Panel:  Recent Labs Lab 01/06/15 1942  01/07/15 0440 01/07/15 1355 01/08/15 0400 01/09/15 0342 01/12/15 0505  NA 133*  < > 138 134* 139 138 130*  K 4.1  < > 3.3* 3.7 4.0 3.9 4.3  CL 109  < > 113* 111 117* 113* 101  CO2 17*  < > 23 20 22 19 23   GLUCOSE 469*  < > 108* 223* 88 125* 337*  BUN 22  < > 19 16 12 10 10   CREATININE 1.45*  < > 1.07 0.81 0.59 0.60 0.72  CALCIUM 9.4  < > 9.5 9.5 9.7 9.7  9.6  MG 1.6  --   --   --   --   --   --   PHOS 4.5  --   --   --   --   --   --   < > = values in this interval not displayed. Liver Function Tests:  Recent Labs Lab 01/08/15 0400 01/09/15 0342  AST 9 11  ALT 8 8  ALKPHOS 72 74  BILITOT <0.1* 0.3  PROT 6.5 6.6  ALBUMIN 1.9* 1.9*   No results for input(s): AMMONIA in the last 168 hours. CBC:  Recent Labs Lab 01/08/15 0400 01/09/15 0342 01/13/15 0515  WBC 13.3* 17.7* 13.7*  HGB 8.2* 8.4* 8.3*  HCT 25.5* 26.8* 26.3*  MCV 83.3 83.5 83.8  PLT 660* 599* 511*    CBG:  Recent Labs Lab 01/12/15 1315 01/12/15 1817 01/12/15 2057 01/13/15 0723 01/13/15 1134  GLUCAP 289* 232* 272* 221* 277*    Recent Results (from the past 240 hour(s))  Blood culture (routine x 2)     Status: None   Collection Time: 01/06/15 10:00 AM  Result Value Ref Range Status   Specimen Description BLOOD LEFT ARM  Final   Special Requests BOTTLES DRAWN AEROBIC AND ANAEROBIC 10ML  Final   Culture   Final    NO GROWTH 5 DAYS Performed at [facility]    Report Status 01/12/2015 FINAL  Final  Blood culture (routine x 2)     Status: None   Collection Time: 01/06/15 10:15 AM  Result Value Ref Range Status   Specimen Description BLOOD LEFT HAND  Final   Special Requests   Final    BOTTLES DRAWN AEROBIC AND ANAEROBIC 10ML AEROBIC 7ML ANAEROBIC   Culture   Final    NO GROWTH 5 DAYS Performed at [facility]    Report Status 01/12/2015 FINAL  Final  Clostridium Difficile by PCR     Status: None   Collection Time: 01/06/15 10:46 AM  Result Value Ref Range Status   C difficile by pcr NEGATIVE NEGATIVE Final  Stool culture     Status: None   Collection Time: 01/06/15 10:46 AM  Result Value Ref Range Status   Specimen Description STOOL  Final   Special Requests Immunocompromised  Final   Culture   Final    NO SALMONELLA, SHIGELLA, CAMPYLOBACTER, YERSINIA, OR E.COLI 0157:H7 ISOLATED Performed at [facility]    Report  Status 01/10/2015 FINAL  Final  Urine culture     Status: None   Collection Time: 01/06/15 10:56 AM  Result Value Ref Range Status   Specimen Description URINE, CATHETERIZED  Final   Special Requests Normal  Final   Colony Count   Final    >=100,000 COLONIES/ML Performed at [place] Performed at [facility]   Final   Report Status 01/09/2015 FINAL  Final  Blood Cultures x 2 sites     Status: None (Preliminary result)   Collection Time: 01/10/15 11:28 AM  Result Value Ref Range Status   Specimen Description BLOOD LEFT ANTECUBITAL  Final   Special Requests BOTTLES DRAWN AEROBIC AND ANAEROBIC  10CC  Final   Culture   Final           BLOOD CULTURE RECEIVED NO GROWTH TO DATE CULTURE WILL BE HELD FOR 5 DAYS BEFORE ISSUING A FINAL NEGATIVE REPORT Performed at [facility]    Report Status PENDING  Incomplete  Blood Cultures x 2 sites     Status: None (Preliminary result)   Collection Time: 01/10/15 11:41 AM  Result Value Ref Range Status   Specimen Description BLOOD LEFT HAND  Final   Special Requests BOTTLES DRAWN AEROBIC ONLY 4CC  Final   Culture   Final           BLOOD CULTURE RECEIVED NO GROWTH TO DATE CULTURE WILL BE HELD FOR 5 DAYS BEFORE ISSUING A FINAL NEGATIVE REPORT Performed at [facility]    Report Status PENDING  Incomplete     Studies:  Recent x-ray studies have been reviewed in detail by the Attending Physician  Scheduled Meds:  Scheduled Meds: . baclofen  10 mg Oral BID WC  . baclofen  20 mg Oral QHS  . collagenase   Topical Daily  . enoxaparin (LOVENOX) injection  40 mg Subcutaneous Q24H  . feeding supplement (GLUCERNA SHAKE)  237 mL Oral TID BM  . feeding supplement (PRO-STAT SUGAR FREE 64)  30 mL Oral BID  . fentaNYL  25 mcg Transdermal Q72H  . fluconazole (DIFLUCAN) IV  200 mg Intravenous Q24H  . insulin aspart  5 Units Subcutaneous TID WC  . insulin glargine  12 Units Subcutaneous QHS  .  loratadine  10 mg Oral Daily  . oxyCODONE-acetaminophen  1 tablet Oral TID PC   And  . oxyCODONE  5 mg Oral TID PC  . pantoprazole  40 mg Oral Daily  . potassium chloride SA  20 mEq Oral Daily  . pregabalin  100 mg Oral TID  . pregabalin  200 mg Oral Q2200  . QUEtiapine  25 mg Oral TID  . tigecycline (TYGACIL) IVPB  50 mg Intravenous Q12H    Time spent on care of this patient: 15 mins   Delfina Redwood , MD  [facility]  [email or web address] Password [email or web address] 01/13/2015, 12:51 PM   LOS: 7 days

## 2015-01-14 LAB — GLUCOSE, CAPILLARY
Glucose-Capillary: 131 mg/dL — ABNORMAL HIGH (ref 70–99)
Glucose-Capillary: 146 mg/dL — ABNORMAL HIGH (ref 70–99)
Glucose-Capillary: 64 mg/dL — ABNORMAL LOW (ref 70–99)

## 2015-01-14 MED ORDER — INSULIN GLARGINE 100 UNIT/ML ~~LOC~~ SOLN
17.0000 [IU] | Freq: Every day | SUBCUTANEOUS | Status: DC
Start: 2015-01-14 — End: 2015-01-14

## 2015-01-14 MED ORDER — ACETAMINOPHEN 325 MG PO TABS
650.0000 mg | ORAL_TABLET | Freq: Four times a day (QID) | ORAL | Status: AC | PRN
Start: 2015-01-14 — End: ?

## 2015-01-14 MED ORDER — HEPARIN SOD (PORK) LOCK FLUSH 100 UNIT/ML IV SOLN
250.0000 [IU] | INTRAVENOUS | Status: AC | PRN
  Administered 2015-01-14: 250 [IU]

## 2015-01-14 MED ORDER — INSULIN GLARGINE 100 UNIT/ML ~~LOC~~ SOLN
15.0000 [IU] | Freq: Every day | SUBCUTANEOUS | Status: DC
  Filled 2015-01-14: qty 0.15

## 2015-01-14 MED ORDER — INSULIN ASPART 100 UNIT/ML ~~LOC~~ SOLN: 5.0000 [IU] | Freq: Three times a day (TID) | SUBCUTANEOUS | Status: DC

## 2015-01-14 MED ORDER — QUETIAPINE FUMARATE 25 MG PO TABS: 25.0000 mg | ORAL_TABLET | Freq: Three times a day (TID) | ORAL | Status: DC

## 2015-01-14 MED ORDER — TIGECYCLINE 50 MG IV SOLR: 50.0000 mg | Freq: Two times a day (BID) | INTRAVENOUS | Status: DC

## 2015-01-14 MED ORDER — INSULIN GLARGINE 100 UNIT/ML ~~LOC~~ SOLN: 15.0000 [IU] | Freq: Every day | SUBCUTANEOUS | Status: DC

## 2015-01-14 MED ORDER — FLUCONAZOLE 200 MG PO TABS: 200.0000 mg | ORAL_TABLET | Freq: Every day | ORAL | Status: DC

## 2015-01-14 NOTE — Progress Notes (Signed)
Patient to be discharged to home today via [facility]. Suprapubic catheter and RUE PICC line to remain in place at discharge. IV team assessed PICC line prior to discharge. Discharge instructions reviewed with patient - detailed notes written out on paperwork for patient's mother pertaining to his medications (including new medications, what medications to stop, and when his medications are due to be taken next) and follow up appts. Patient verbalized understanding. Patient to receive IV antibiotics at home; home health was set up by CM.   Dressing changes completed to patient's left foot, back, and sacral wounds as ordered.   Joellen Jersey, RN.

## 2015-01-14 NOTE — Plan of Care (Signed)
Problem: Phase II Progression Outcomes Goal: Obtain order to discontinue catheter if appropriate Outcome: Adequate for Discharge Chronic suprapubic catheter.  Problem: Phase III Progression Outcomes Goal: Pain controlled on oral analgesia Outcome: Completed/Met Date Met:  01/14/15 Still getting IV fentanyl with dressing changes. Goal: IV/normal saline lock discontinued Outcome: Adequate for Discharge Will be discharged home with PICC line. Goal: Foley discontinued Outcome: Adequate for Discharge Chronic suprapubic catheter.

## 2015-01-14 NOTE — Clinical Social Work Note (Signed)
Patient medically stable for discharge today and will be transported home by ambulance.   Karlene Southard Givens, MSW, LCSW Licensed Clinical Social Worker [place] [phone]

## 2015-01-14 NOTE — Care Management Note (Addendum)
CARE MANAGEMENT NOTE 01/14/2015  Patient:  Jason [name], Jason [name]   Account Number:  [id number]  Date Initiated:  01/07/2015  Documentation initiated by:  COLE,ANGELA  Subjective/Objective Assessment:   PTA from home with mom, bed  bound. Hx of  spinal CVA , encephalopathy. Admitted with sepsis/uti.     Action/Plan:   Return to home when medically stable.  01/11/2015 Met with pt and mother, she has concerns that he may need home oxygen , however not on oxygen now. this CM provided PCS applicatiion. Active with HH and will resume   Anticipated DC Date:  01/14/2015   Anticipated DC Plan:  [place]  CM consult      Choice offered to / List presented to:     DME arranged  IV PUMP/EQUIPMENT      DME agency  [place] arranged  HH-1 RN      [place]   Status of service:  Completed, signed off Medicare Important Message given?  NO (If response is "NO", the following Medicare IM given date fields will be blank) Date Medicare IM given:   Medicare IM given by:   Date Additional Medicare IM given:   Additional Medicare IM given by:    Discharge Disposition:  [place]  Per UR Regulation:  Reviewed for med. necessity/level of care/duration of stay  If discussed at [place] of Stay Meetings, dates discussed:    Comments:  01/14/2015 [place] Notified by [place] that [facility] has been able to order Tigecycline and pt may d/c this pm with [facility] providing first home dose this pm. Pt RN notified and will prepare pt for d/c . CRoyal RN MPH, case manager, [phone]    01/14/2015 Notified by [facility] that pt IV antibiotic Tigecycline is on national backorder, [place] has none of the medication, the hospital is willing to sell them 10 of the 13 doses that they have in stock, however after that neither the hospital or [facility] would be able to provide the drug. Currently awaiting efforts by [facility] and  [facility] to obtain the drug. Per ID this is the drug that is needed and they are unable to provide a subsitute at this time. Await plan. Both pt and mother aware of issue. CRoyal RN MPH, case manager, [phone]  01/14/2015 Pt will d/c to home today, [facility] notified of plan to d/c and IV medication needs, prescription placed in front of chart. [facility] notified of plan to d/c today and orders to resume [facility] services entered.  CRoyal RN MPH, case Freight forwarder.   01/11/2015 Met with pt and pt mother, plan is to d/c to home with [name] to continue [facility] services for wound care. Mother states that they have no other DME needs and she would like to have oxygen, however pt does not qualify for home O2 at this time he currently is not using oxygen. Mother states that she is aware of [place] and feels that he would qualify, however he has not wanted these services in the past. This CM provided an updated application form for pt mother to take to pt PCP if they determine that extra assistance is needed. CRoyal RN MPH, case manager, [phone]

## 2015-01-14 NOTE — Discharge Summary (Addendum)
Physician Discharge Summary  Jason [name] [place] [facility]:[id number] DOB: 07/22/1986 DOA: 01/06/2015  PCP: [name], MD  Admit date: 01/06/2015 Discharge date: 01/14/2015  Time spent: 35 minutes  Recommendations for Outpatient Follow-up:  1. Home health 2. abx for at least 2 weeks- will need to see ID for final recommendations 3. Continue to follow with wound care  Discharge Diagnoses:  Principal Problem:   Sepsis Active Problems:   IDDM (insulin dependent diabetes mellitus)   Spinal cord infarction (history of)   Hypoglycemia   Acute encephalopathy   UTI (lower urinary tract infection)   Stage IV decubitus ulcer   AKI (acute kidney injury)   Suprapubic catheter   DKA (diabetic ketoacidoses)   Allergy to multiple antibiotics   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   Acidosis   FUO (fever of unknown origin)   HCAP (healthcare-associated pneumonia)   Bullous skin disease   Discharge Condition: improved  Diet recommendation: diabetic  Filed Weights   01/09/15 2031 01/11/15 2039 01/12/15 2058  Weight: 57.2 kg (126 lb 1.7 oz) 57.6 kg (126 lb 15.8 oz) 58.2 kg (128 lb 4.9 oz)    History of present illness:  Jason [name] is a 29 y.o. male with past medical history of type 1 DM, history of spinal infarct, he is being quadriplegic since, history of diabetic neuropathy. Patient brought to the hospital by EMS because of lethargy and altered mental status. Patient was recently discharged from the hospital on 12/01/2014 after treated for severe sepsis, aspiration pneumonia and multiple sacral decubitus ulcers with aztreonam, vancomycin and Flagyl. IV antibiotics were finished on Sunday, according to his mother since Sunday he is been getting progressively worse, not being himself, he developed fever of 101.5, lethargic and not eating. Earlier today the current arouse and so they brought him to the ED for further evaluation. In the ED patient received  Narcan without significant change, his mother reported that he did not get any extra pain medications, he was hypothermic with temperature of 96.5, his WBC is 18.8, patient admitted to stepdown for further evaluation.  Hospital Course:  Sepsis due to decubitus ulcer/pna Continued fevers. bc neg. Urine cx with yeast. SP cath changed by urology. CT C/A/P shows bilateral pneumonia. Now on fluconazole and tigecycline. 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 2. No residual focal soft tissue abscess. 3. Generalized muscular edema and enhancement are similar to the prior examination. The subcutaneous edema has improved. 4. No significant hip findings.  Pneumonia -abx  UTI w/ indwelling suprapubic catheter / neurogenic bladder Urine culture with 100K yeast. See above. On diflucan  Early DKA - uncontrolled DM1, brittle Hypoglycemia earlier this week, and insulin down titrated. CBGs now in the 200s. Will increase lantus to 15 units and continue novolog 5 with meals  Chronic hypotension On midodrine - stopped all other BP meds - BP currently stable   Acute encephalopathy secondary to toxic metabolic resolved  History of a spinal cord infarction w/ quadriplegia S/p diverting colostomy and suprapubic urinary cath   Acute kidney injury Resolved  Chronic anemia stable  Severe Protein calorie malnutrition: intake improving   Long conversation with mother, who is his  main care giver at home.  Suspect she is not able to properly care for patient which is why he continues to bounce back to hospital. Mother says patient does not eat correctly at home and does not  turn himself as he is supposed to. Mother is hesitant to take patient back home but she and patient are refusing SNF.  Asking for O2 at home as mother states he de-saturates at night- reports he has since he was a baby but 100% here in hospital- can have outpatient OSA   -will add social work to home health  Procedures:    Consultations:  ID  Discharge Exam: Filed Vitals:   01/14/15 0903  BP: 103/66  Pulse: 79  Temp: 99.1 F (37.3 C)  Resp: 19     Discharge Instructions   Discharge Instructions    Diet Carb Modified    Complete by:  As directed      Discharge instructions    Complete by:  As directed   Continue abx for 2 weeks     Increase activity slowly    Complete by:  As directed           Current Discharge Medication List    START taking these medications   Details  fluconazole (DIFLUCAN) 200 MG tablet Take 1 tablet (200 mg total) by mouth daily. Qty: 14 tablet, Refills: 0    tigecycline 50 mg in sodium chloride 0.9 % 100 mL Inject 50 mg into the vein every 12 (twelve) hours. Qty: 28 each, Refills: 0      CONTINUE these medications which have CHANGED   Details  acetaminophen (TYLENOL) 325 MG tablet Take 2 tablets (650 mg total) by mouth every 6 (six) hours as needed for mild pain, moderate pain, fever or headache.    insulin aspart (NOVOLOG) 100 UNIT/ML injection Inject 5 Units into the skin 3 (three) times daily with meals. Qty: 10 mL, Refills: 11    insulin glargine (LANTUS) 100 UNIT/ML injection Inject 0.15 mLs (15 Units total) into the skin at bedtime. Qty: 10 mL, Refills: 11      CONTINUE these medications which have NOT CHANGED   Details  albuterol (PROVENTIL) (2.5 MG/3ML) 0.083% nebulizer solution Take 3 mLs (2.5 mg total) by nebulization every 4 (four) hours as needed for wheezing or shortness of breath. Qty: 75 mL, Refills: 3    baclofen (LIORESAL) 10 MG tablet Take 1-2 tablets (10-20 mg total) by mouth 3 (three) times daily. Takes  10mg  in morning and lunchtime and takes 20mg  at bedtime Qty: 30 each, Refills: 0   Associated Diagnoses: Spinal cord infarction; Type 1 diabetes, uncontrolled, with neuropathy; Sacral pressure sore, unspecified pressure ulcer stage; Tetraplegia    dexlansoprazole (DEXILANT) 60 MG capsule Take 1 capsule (60 mg total) by mouth daily. Qty: 30 capsule, Refills: 0    diclofenac sodium (VOLTAREN) 1 % GEL Apply 2 g topically daily as needed (for pain). Qty: 100 g, Refills: 0    feeding supplement, GLUCERNA SHAKE, (GLUCERNA SHAKE) LIQD Take 237 mLs by mouth 3 (three) times daily between meals. Qty: 30 Can, Refills: 0    fentaNYL (DURAGESIC - DOSED MCG/HR) 25 MCG/HR patch Place 1 patch (25 mcg total) onto the skin every 3 (three) days. Qty: 10 patch, Refills: 0   Associated Diagnoses: Spinal cord infarction; Type 1 diabetes, uncontrolled, with neuropathy; Sacral pressure sore, unspecified pressure ulcer stage; Tetraplegia    glucagon (GLUCAGON EMERGENCY) 1 MG injection Inject 1 mg into the vein  once as needed. Qty: 1 each, Refills: 12    loratadine (CLARITIN) 10 MG tablet Take 10 mg by mouth daily.     midodrine (PROAMATINE) 5 MG tablet Take 5 mg by mouth as needed (Systolic blood pressure <10).    ondansetron (ZOFRAN ODT) 8 MG disintegrating tablet Take 1 tablet (8 mg total) by mouth every 8 (eight) hours as needed for nausea or vomiting. Qty: 60 tablet, Refills: 5    oxyCODONE-acetaminophen (PERCOCET) 10-325 MG per tablet Take 1 tablet by mouth every 8 (eight) hours. Qty: 90 tablet, Refills: 0   Associated Diagnoses: Spinal cord infarction; Type 1 diabetes, uncontrolled, with neuropathy; Sacral pressure sore, unspecified pressure ulcer stage; Tetraplegia    potassium chloride SA (KLOR-CON M20) 20 MEQ tablet Take 1 tablet (20 mEq total) by mouth daily. Qty: 90 tablet, Refills: 3    !! pregabalin (LYRICA) 100 MG capsule Take 1-2 capsules (100-200 mg total) by mouth 4 (four) times daily. Take  100 mg by mouth in the morning, take 100 mg by mouth in the afternoon, take 100 mg by mouth in the evening and take 200 mg by mouth at bedtime. Qty: 150 capsule, Refills: 4   Associated Diagnoses: Spinal cord infarction; Type 1 diabetes, uncontrolled, with neuropathy; Sacral pressure sore, unspecified pressure ulcer stage; Tetraplegia    QUEtiapine (SEROQUEL) 25 MG tablet Take 1 tablet (25 mg total) by mouth 3 (three) times daily. Qty: 90 tablet, Refills: 0    sodium chloride (OCEAN) 0.65 % SOLN nasal spray Place 1 spray into both nostrils as needed for congestion.    collagenase (SANTYL) ointment Apply 1 application topically daily. Qty: 90 g, Refills: 5    !! pregabalin (LYRICA) 200 MG capsule Take 1 capsule (200 mg total) by mouth at bedtime. Qty: 30 capsule, Refills: 0     !! - Potential duplicate medications found. Please discuss with provider.    STOP taking these medications     atorvastatin (LIPITOR) 20 MG tablet      aztreonam 1 g in sodium chloride 0.9 % 50 mL      diphenoxylate-atropine (LOMOTIL) 2.5-0.025 MG per tablet      lidocaine (XYLOCAINE) 2 % jelly      lidocaine (XYLOCAINE) 5 % ointment      metroNIDAZOLE (FLAGYL) 500 MG tablet      vancomycin (VANCOCIN) 1 GM/200ML SOLN      liver oil-zinc oxide (DESITIN) 40 % ointment        Allergies  Allergen Reactions  . Cefuroxime Axetil Anaphylaxis  . Ertapenem Other (See Comments)    Rash and confusion  . Morphine And Related Other (See Comments)    Changed mental status, confusion, headache, visual hallucination  . Penicillins Anaphylaxis and Other (See Comments)    ?can take amoxicillin?  . Sulfa Antibiotics Anaphylaxis, Shortness Of Breath and Other (See Comments)  . Tessalon [Benzonatate] Anaphylaxis  . Shellfish Allergy Itching and Other (See Comments)    Took benadryl to alleviate reaction   Follow-up Information    Follow up with MACDIARMID,SCOTT A, MD On 01/19/2015.   Specialty:  Urology   Why:   next available; Wednesday, 01/19/2015 @ 9:30am    Contact information:   [place] [address] [phone]       Follow up with [name], MD On 01/19/2015.   Specialty:  Family Medicine   Why:  Appointment: Wednesday, 01/19/2015 @ 1:00pm   Contact information:   [place] [address]  [phone]       Follow up with [name], MD In 1 week.   Specialty:  Infectious Diseases   Why:  Dr. Drucilla Schmidt office will contact patient about appointment   Contact information:   [address] [place] [address] [phone]        The results of significant diagnostics from this hospitalization (including imaging, microbiology, ancillary and laboratory) are listed below for reference.    Significant Diagnostic Studies: Ct Head Wo Contrast  01/06/2015   CLINICAL DATA:  Lethargy, history of quadriplegia  EXAM: CT HEAD WITHOUT CONTRAST  TECHNIQUE: Contiguous axial images were obtained from the base of the skull through the vertex without intravenous contrast.  COMPARISON:  12/01/2014  FINDINGS: No skull fracture is noted. Paranasal sinuses and mastoid air cells are unremarkable. No intracranial hemorrhage, mass effect or midline shift. No acute infarction. No mass lesion is noted on this unenhanced scan. Ventricular size is stable from prior exam.  IMPRESSION: No acute intracranial abnormality.  No significant change.   Electronically Signed   By: Lahoma Crocker M.D.   On: 01/06/2015 11:47   Ct Chest W Contrast  01/11/2015   CLINICAL DATA:  Fever of unknown origin  EXAM: CT CHEST, ABDOMEN, AND PELVIS WITH CONTRAST  TECHNIQUE: Multidetector CT imaging of the chest, abdomen and pelvis was performed following the standard protocol during bolus administration of intravenous contrast.  CONTRAST:  46mL OMNIPAQUE IOHEXOL 300 MG/ML  SOLN  COMPARISON:  None.  FINDINGS: CT CHEST FINDINGS  The lungs are well aerated bilaterally. Patchy  pneumonia is identified in the lower lobes similar to that seen on the prior plain film examination. The upper lobes are relatively spared.  The thoracic inlet is within normal limits. A right-sided PICC line is noted in satisfactory position. The thoracic aorta and pulmonary artery are within normal limits. Scattered small hilar and mediastinal lymph nodes are seen likely of reactive nature these are most prominent in the aorticopulmonary window.  No acute bony abnormality is noted.  CT ABDOMEN AND PELVIS FINDINGS  The liver, gallbladder, spleen,, adrenal glands and pancreas are normal in their CT appearance. Left kidney is well visualized. No renal calculi or obstructive changes are noted. Right kidney again demonstrates some scarring medially stable from the prior exam.  The bladder wall is thickened and enhanced but stable from the prior exam. The bladder is partially decompressed by Foley catheter. Some air is noted within the bladder related to the recent instrumentation.  The appendix is within normal limits. No pelvic mass lesion is noted. Decubitus ulcers are noted over the sacrum as well as over the right ischial tuberosity. The ischial tuberosity decubitus ulcer appears more prominent and this may be related to recent debridement. Underlying bony erosion is noted in the ischial tuberosity which is more prominent than that seen on the prior exam consistent with underlying osteomyelitis.  IMPRESSION: Bilateral lower lobe pneumonia which is increased in the interval from the prior exam.  Thickening of the bladder wall which is stable from the prior study.  Decubitus ulcers with changes of osteomyelitis within the right ischium posteriorly. The degree of bony erosion appears to have progressed in the interval from the prior study.   Electronically Signed   By: Inez Catalina M.D.   On: 01/11/2015 15:43   Mr Pelvis W Wo Contrast  01/08/2015   CLINICAL DATA:  Follow up decubitus ulcers/ osteomyelitis.  Subsequent encounter.  EXAM: MRI PELVIS WITHOUT  AND WITH CONTRAST  TECHNIQUE: Multiplanar multisequence MR imaging of the pelvis was performed both before and after administration of intravenous contrast.  CONTRAST:  62mL MULTIHANCE GADOBENATE DIMEGLUMINE 529 MG/ML IV SOLN  COMPARISON:  Pelvic CT 12/11/2014.  MRI 11/10/2014.  FINDINGS: Large decubitus ulcer over the right ischial tuberosity is again noted. This ulcer extends to the bone. Lesser decubitus ulceration over the lower sacrum and left ischium is unchanged.  Generalized muscular edema and enhancement within the pelvis and proximal thighs is similar to the prior examination. No residual fluid collections are seen within the right adductor musculature. Subcutaneous edema has improved compared with the prior study.  The extent of T2 hyperintensity and enhancement within the right ischial tuberosity has mildly progressed compared with the prior MRI. No progressive bone destruction is identified compared with the more recent CT. There is no suspicious signal or enhancement within the left ischium or sacrum.  The femoral heads are stable in appearance. There is no significant residual hip joint effusion or suspicious synovial enhancement.  Suprapubic bladder catheter and left lower quadrant ostomy again noted. Some free fluid noted in the peritoneal cavity.  IMPRESSION: 1. Persistent large decubitus ulceration over the right ischial tuberosity, extending to the cortex. Underlying osteomyelitis within the ischium has mildly progressed compared with the MRI of 2 months ago. 2. No residual focal soft tissue abscess. 3. Generalized muscular edema and enhancement are similar to the prior examination. The subcutaneous edema has improved. 4. No significant hip findings.   Electronically Signed   By: Richardean Sale M.D.   On: 01/08/2015 15:02   Ct Abdomen Pelvis W Contrast  01/11/2015   CLINICAL DATA:  Fever of unknown origin  EXAM: CT CHEST, ABDOMEN, AND PELVIS WITH  CONTRAST  TECHNIQUE: Multidetector CT imaging of the chest, abdomen and pelvis was performed following the standard protocol during bolus administration of intravenous contrast.  CONTRAST:  73mL OMNIPAQUE IOHEXOL 300 MG/ML  SOLN  COMPARISON:  None.  FINDINGS: CT CHEST FINDINGS  The lungs are well aerated bilaterally. Patchy pneumonia is identified in the lower lobes similar to that seen on the prior plain film examination. The upper lobes are relatively spared.  The thoracic inlet is within normal limits. A right-sided PICC line is noted in satisfactory position. The thoracic aorta and pulmonary artery are within normal limits. Scattered small hilar and mediastinal lymph nodes are seen likely of reactive nature these are most prominent in the aorticopulmonary window.  No acute bony abnormality is noted.  CT ABDOMEN AND PELVIS FINDINGS  The liver, gallbladder, spleen,, adrenal glands and pancreas are normal in their CT appearance. Left kidney is well visualized. No renal calculi or obstructive changes are noted. Right kidney again demonstrates some scarring medially stable from the prior exam.  The bladder wall is thickened and enhanced but stable from the prior exam. The bladder is partially decompressed by Foley catheter. Some air is noted within the bladder related to the recent instrumentation.  The appendix is within normal limits. No pelvic mass lesion is noted. Decubitus ulcers are noted over the sacrum as well as over the right ischial tuberosity. The ischial tuberosity decubitus ulcer appears more prominent and this may be related to recent debridement. Underlying bony erosion is noted in the ischial tuberosity which is more prominent than that seen on the prior exam consistent with underlying osteomyelitis.  IMPRESSION: Bilateral lower lobe pneumonia which is increased in the interval from the prior exam.  Thickening of the bladder wall which  is stable from the prior study.  Decubitus ulcers with changes of  osteomyelitis within the right ischium posteriorly. The degree of bony erosion appears to have progressed in the interval from the prior study.   Electronically Signed   By: Inez Catalina M.D.   On: 01/11/2015 15:43   Mri Left Foot Without Contrast  01/11/2015   CLINICAL DATA:  Diabetic foot ulcers  EXAM: MRI OF THE LEFT FOREFOOT WITHOUT CONTRAST  TECHNIQUE: Multiplanar, multisequence MR imaging was performed. No intravenous contrast was administered.  COMPARISON:  11/17/2014 radiographs hand prior MRI 09/16/2014  FINDINGS: No definite MR findings to suggest septic arthritis or osteomyelitis. No significant changes of cellulitis. There is diffuse edema like signal abnormality in the short flexor muscles of the foot suggesting myositis. No focal fluid collections to suggest a drainable abscess.  IMPRESSION: No definite MR findings for septic arthritis or osteomyelitis.  No cellulitis or drainable soft tissue abscess.  Diffuse myositis.   Electronically Signed   By: Marijo Sanes M.D.   On: 01/11/2015 08:46   Dg Chest Port 1 View  01/10/2015   CLINICAL DATA:  Fever.  Diabetic foot ulcer.  EXAM: PORTABLE CHEST - 1 VIEW  COMPARISON:  01/06/2015  FINDINGS: Right arm PICC line remains in appropriate position.  Worsening symmetric bilateral mid and lower lung airspace disease is seen, which may be due to worsening edema or pneumonia. No significant pleural effusion seen on this portable exam. Heart size is normal.  IMPRESSION: Worsening symmetric mid and lower lung airspace disease, which may be due to worsening pulmonary edema or pneumonia.   Electronically Signed   By: Earle Gell M.D.   On: 01/10/2015 12:16   Dg Chest Port 1 View  01/06/2015   CLINICAL DATA:  29 year old male with altered mental status. PICC line position evaluation. Initial encounter.  EXAM: PORTABLE CHEST - 1 VIEW  COMPARISON:  12/18/2014 and earlier.  FINDINGS: Portable AP semi upright view at 0956 hours. Right upper extremity PICC line re-  identified. Tip is at the level of the carina.  Extubated. Enteric tube removed. Stable to mildly improved lung volumes. Decreased but not resolved bilateral patchy infrahilar opacity. No pneumothorax or pulmonary edema. No pleural effusion. Increased gaseous distension of the stomach.  IMPRESSION: 1. Right PICC line tip at the SVC level. 2. Extubated and enteric tube removed. 3. Regressed but not resolved bibasilar pulmonary opacity.   Electronically Signed   By: Genevie Ann M.D.   On: 01/06/2015 10:20   Dg Chest Port 1 View  12/18/2014   CLINICAL DATA:  30 year old male with a history of shortness of breath.  EXAM: PORTABLE CHEST - 1 VIEW  COMPARISON:  Multiple prior chest x-ray, most recent 12/17/2014, 12/16/2014  FINDINGS: Cardiomediastinal silhouette unchanged in size and contour.  Airspace disease persists in the bilateral lower lungs, though overall improved from the comparison plain film.  No large pleural effusion.  No pneumothorax.  Unchanged position of the endotracheal tube, which appears to terminate approximately 4.9 cm above the carina.  Enteric tube projects over the mediastinum.  Unchanged position of right approach PICC.  IMPRESSION: Improving bibasilar airspace disease.  Unchanged position of endotracheal tube enteric tube and right upper extremity PICC.  Signed,  Dulcy Fanny. Earleen Newport, DO  [facility]  [facility]   Electronically Signed   By: Corrie Mckusick D.O.   On: 12/18/2014 07:18   Dg Chest Port 1 View  12/17/2014   CLINICAL DATA:  Respiratory failure.  EXAM: PORTABLE CHEST - 1 VIEW  COMPARISON:  12/16/2014.  FINDINGS: Tracheostomy tube, NG tube, right PICC line in stable position. Persistent prominent bibasilar pulmonary infiltrates. Heart size stable. No pleural effusion or pneumothorax. No acute bony abnormality .  IMPRESSION: 1. Lines and tubes in stable position. 2. Persistent prominent bibasilar pulmonary infiltrates most consistent with  pneumonia. No significant interval clearing.   Electronically Signed   By: Marcello Moores  Register   On: 12/17/2014 07:49   Dg Chest Port 1 View  12/16/2014   CLINICAL DATA:  Ventilator dependent respiratory failure  EXAM: PORTABLE CHEST - 1 VIEW  COMPARISON:  Portable chest x-ray of 12/15/2014  FINDINGS: There is slight worsening of opacity at the right lung base with little change in the opacity medially at the left lung base most consistent with pneumonia. No definite effusion is seen. The tip of the endotracheal tube is approximately 5.5 cm above the carina. Right central venous line tip overlies the mid lower SVC and no pneumothorax is seen. An NG tube is present.  IMPRESSION: Slight worsening of lower lobe infiltrates most consistent with pneumonia.   Electronically Signed   By: [name]   On: 12/16/2014 07:30    Microbiology: Recent Results (from the past 240 hour(s))  Blood culture (routine x 2)     Status: None   Collection Time: 01/06/15 10:00 AM  Result Value Ref Range Status   Specimen Description BLOOD LEFT ARM  Final   Special Requests BOTTLES DRAWN AEROBIC AND ANAEROBIC 10ML  Final   Culture   Final    NO GROWTH 5 DAYS Performed at [facility]    Report Status 01/12/2015 FINAL  Final  Blood culture (routine x 2)     Status: None   Collection Time: 01/06/15 10:15 AM  Result Value Ref Range Status   Specimen Description BLOOD LEFT HAND  Final   Special Requests   Final    BOTTLES DRAWN AEROBIC AND ANAEROBIC 10ML AEROBIC 7ML ANAEROBIC   Culture   Final    NO GROWTH 5 DAYS Performed at [facility]    Report Status 01/12/2015 FINAL  Final  Clostridium Difficile by PCR     Status: None   Collection Time: 01/06/15 10:46 AM  Result Value Ref Range Status   C difficile by pcr NEGATIVE NEGATIVE Final  Stool culture     Status: None   Collection Time: 01/06/15 10:46 AM  Result Value Ref Range Status   Specimen Description STOOL  Final   Special Requests  Immunocompromised  Final   Culture   Final    NO SALMONELLA, SHIGELLA, CAMPYLOBACTER, YERSINIA, OR E.COLI [id number] ISOLATED Performed at [facility]    Report Status 01/10/2015 FINAL  Final  Urine culture     Status: None   Collection Time: 01/06/15 10:56 AM  Result Value Ref Range Status   Specimen Description URINE, CATHETERIZED  Final   Special Requests Normal  Final   Colony Count   Final    >=100,000 COLONIES/ML Performed at [facility]    Culture YEAST Performed at [facility]   Final   Report Status 01/09/2015 FINAL  Final  Blood Cultures x 2 sites     Status: None (Preliminary result)   Collection Time: 01/10/15 11:28 AM  Result Value Ref Range Status   Specimen Description BLOOD LEFT ANTECUBITAL  Final   Special Requests BOTTLES DRAWN AEROBIC AND ANAEROBIC 10CC  Final  Culture   Final           BLOOD CULTURE RECEIVED NO GROWTH TO DATE CULTURE WILL BE HELD FOR 5 DAYS BEFORE ISSUING A FINAL NEGATIVE REPORT Performed at [facility]    Report Status PENDING  Incomplete  Blood Cultures x 2 sites     Status: None (Preliminary result)   Collection Time: 01/10/15 11:41 AM  Result Value Ref Range Status   Specimen Description BLOOD LEFT HAND  Final   Special Requests BOTTLES DRAWN AEROBIC ONLY 4CC  Final   Culture   Final           BLOOD CULTURE RECEIVED NO GROWTH TO DATE CULTURE WILL BE HELD FOR 5 DAYS BEFORE ISSUING A FINAL NEGATIVE REPORT Performed at [facility]    Report Status PENDING  Incomplete     Labs: Basic Metabolic Panel:  Recent Labs Lab 01/07/15 1355 01/08/15 0400 01/09/15 0342 01/12/15 0505  NA 134* 139 138 130*  K 3.7 4.0 3.9 4.3  CL 111 117* 113* 101  CO2 20 22 19 23   GLUCOSE 223* 88 125* 337*  BUN 16 12 10 10   CREATININE 0.81 0.59 0.60 0.72  CALCIUM 9.5 9.7 9.7 9.6   Liver Function Tests:  Recent Labs Lab 01/08/15 0400 01/09/15 0342  AST 9 11  ALT 8 8  ALKPHOS 72 74  BILITOT <0.1* 0.3   PROT 6.5 6.6  ALBUMIN 1.9* 1.9*    Recent Labs Lab 01/13/15 1514  LIPASE 20  AMYLASE 21   No results for input(s): AMMONIA in the last 168 hours. CBC:  Recent Labs Lab 01/08/15 0400 01/09/15 0342 01/13/15 0515  WBC 13.3* 17.7* 13.7*  HGB 8.2* 8.4* 8.3*  HCT 25.5* 26.8* 26.3*  MCV 83.3 83.5 83.8  PLT 660* 599* 511*   Cardiac Enzymes: No results for input(s): CKTOTAL, CKMB, CKMBINDEX, TROPONINI in the last 168 hours. BNP: BNP (last 3 results) No results for input(s): BNP in the last 8760 hours.  ProBNP (last 3 results)  Recent Labs  01/29/14 2113  PROBNP 1343.0*    CBG:  Recent Labs Lab 01/13/15 1134 01/13/15 1637 01/13/15 2128 01/14/15 0747 01/14/15 1145  GLUCAP 277* 227* 143* 131* 146*       Signed:  Caydee Talkington  [facility] 01/14/2015, 1:13 PM

## 2015-01-14 NOTE — Care Management Note (Signed)
CARE MANAGEMENT NOTE 01/14/2015  Patient:  Jason [name], Jason [name]   Account Number:  [id number]  Date Initiated:  01/07/2015  Documentation initiated by:  COLE,ANGELA  Subjective/Objective Assessment:   PTA from home with mom, bed  bound. Hx of  spinal CVA , encephalopathy. Admitted with sepsis/uti.     Action/Plan:   Return to home when medically stable.  01/11/2015 Met with pt and mother, she has concerns that he may need home oxygen , however not on oxygen now. this CM provided PCS applicatiion. Active with HH and will resume   Anticipated DC Date:  01/14/2015   Anticipated DC Plan:  [place]  CM consult      Choice offered to / List presented to:     DME arranged  IV PUMP/EQUIPMENT      DME agency  [place] arranged  HH-1 RN      [place]   Status of service:  Completed, signed off Medicare Important Message given?  NO (If response is "NO", the following Medicare IM given date fields will be blank) Date Medicare IM given:   Medicare IM given by:   Date Additional Medicare IM given:   Additional Medicare IM given by:    Discharge Disposition:  [place]  Per UR Regulation:  Reviewed for med. necessity/level of care/duration of stay  If discussed at [place] of Stay Meetings, dates discussed:    Comments:  01/14/2015 Pt will d/c to home today, [facility] notified of plan to d/c and IV medication needs, prescription placed in front of chart. [facility] notified of plan to d/c today and orders to resume [facility] services entered.  CRoyal RN MPH, case Freight forwarder.   01/11/2015 Met with pt and pt mother, plan is to d/c to home with [name] to continue [facility] services for wound care. Mother states that they have no other DME needs and she would like to have oxygen, however pt does not qualify for home O2 at this time he currently is not using oxygen. Mother states that she is aware of [facility] and feels that he would qualify, however he has not wanted these services in the past. This CM provided an updated application form for pt mother to take to pt PCP if they determine that extra assistance is needed. CRoyal RN MPH, case manager, [phone]

## 2015-01-14 NOTE — Progress Notes (Signed)
Progress Note  Jason [name] IDP:[id number] DOB: 07-04-1986 DOA: 01/06/2015 PCP: [name], MD  Admit HPI / Brief Narrative: 29 y.o. 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 Patient was discharged from the hospital on 12/01/2014 after being treated for severe sepsis, aspiration pneumonia, and multiple sacral decubitus ulcers with aztreonam, vancomycin and flagyl. IV antibiotics were finished on 3/27 and according to his mother since then he had been getting progressively worse, w/ fever of 101.5, lethargic and not eating.  In the ED patient received Narcan without significant change, his mother reported that he did not get any extra pain medications, he was hypothermic with temperature of 96.5, his WBC was 18.8, patient admitted to stepdown for further evaluation.  Assessment/Plan:  Sepsis  Continued fevers. bc neg. Urine cx with yeast. SP cath changed by urology. CT C/A/P shows bilateral pneumonia. Now on fluconazole and tigecycline. 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 2. No residual focal soft tissue abscess. 3. Generalized muscular edema and enhancement are similar to the prior examination. The subcutaneous edema has improved. 4. No significant hip findings.   UTI w/ indwelling suprapubic catheter / neurogenic bladder Urine culture with 100K yeast. See above. On diflucan  Early DKA - uncontrolled DM1, brittle Hypoglycemia earlier this week, and insulin down titrated. CBGs now in the 200s.  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  She does want to take patient home even though he is afebrile-says 24 hours is not long enough but 36 hour would be.  Suspect she is not able to properly care for patient which is why he continues to bounce back to hospital.  Mother says patient does not eat correctly at home and does not turn himself as he is supposed to.  Mother is hesitant to take patient back home but she and patient are refusing SNF.   Asking for O2 at home as mother states he de-saturates at night- reports he has since he was a baby  -will add social work to home health   Code Status: FULL Family Communication: mother on phone 4/8 Disposition Plan:   Consultants: ID  Procedures: none  Antibiotics: Aztreonam 3/31 Vanc 3/31 > 4/6 Diflucan 4/01 > tigecycline 4/6 --  HPI/Subjective: Eating 100% of meals No pain  Objective: Blood pressure 103/66, pulse 79, temperature 99.1 F (37.3 C), temperature source Oral, resp. rate 19, height 5\' 8"  (1.727 m), weight 58.2 kg (128 lb 4.9 oz), SpO2 100 %.  Intake/Output Summary (Last 24 hours) at 01/14/15 1132 Last data filed at 01/14/15 1034  Gross per 24 hour  Intake 3827.33 ml  Output  2325 ml  Net 1502.33 ml   Exam: General: awake Lungs: Clear to auscultation bilaterally without wheezes or crackles Cardiovascular: Regular rate and rhythm without murmur gallop or rub  Abdomen: Nontender, nondistended, soft, bowel sounds positive, no rebound Extremities: No  significant cyanosis, clubbing, or edema bilateral lower extremities  Data Reviewed: Basic Metabolic Panel:  Recent Labs Lab 01/07/15 1355 01/08/15 0400 01/09/15 0342 01/12/15 0505  NA 134* 139 138 130*  K 3.7 4.0 3.9 4.3  CL 111 117* 113* 101  CO2 20 22 19 23   GLUCOSE 223* 88 125* 337*  BUN 16 12 10 10   CREATININE 0.81 0.59 0.60 0.72  CALCIUM 9.5 9.7 9.7 9.6   Liver Function Tests:  Recent Labs Lab 01/08/15 0400 01/09/15 0342  AST 9 11  ALT 8 8  ALKPHOS 72 74  BILITOT <0.1* 0.3  PROT 6.5 6.6  ALBUMIN 1.9* 1.9*   No results for input(s): AMMONIA in the last 168 hours. CBC:  Recent Labs Lab 01/08/15 0400 01/09/15 0342 01/13/15 0515  WBC 13.3* 17.7* 13.7*  HGB 8.2* 8.4* 8.3*  HCT 25.5* 26.8* 26.3*  MCV 83.3 83.5 83.8  PLT 660* 599* 511*    CBG:  Recent Labs Lab 01/13/15 0723 01/13/15 1134 01/13/15 1637 01/13/15 2128 01/14/15 0747  GLUCAP 221* 277* 227* 143* 131*    Recent Results (from the past 240 hour(s))  Blood culture (routine x 2)     Status: None   Collection Time: 01/06/15 10:00 AM  Result Value Ref Range Status   Specimen Description BLOOD LEFT ARM  Final   Special Requests BOTTLES DRAWN AEROBIC AND ANAEROBIC 10ML  Final   Culture   Final    NO GROWTH 5 DAYS Performed at [facility]    Report Status 01/12/2015 FINAL  Final  Blood culture (routine x 2)     Status: None   Collection Time: 01/06/15 10:15 AM  Result Value Ref Range Status   Specimen Description BLOOD LEFT HAND  Final   Special Requests   Final    BOTTLES DRAWN AEROBIC AND ANAEROBIC 10ML AEROBIC 7ML ANAEROBIC   Culture   Final    NO GROWTH 5 DAYS Performed at [facility]    Report Status 01/12/2015 FINAL  Final  Clostridium Difficile by PCR     Status: None   Collection Time: 01/06/15 10:46 AM  Result Value Ref Range Status   C difficile by pcr NEGATIVE NEGATIVE Final  Stool culture     Status: None   Collection Time: 01/06/15 10:46 AM  Result  Value Ref Range Status   Specimen Description STOOL  Final   Special Requests Immunocompromised  Final   Culture   Final    NO SALMONELLA, SHIGELLA, CAMPYLOBACTER, YERSINIA, OR E.COLI 0157:[id number] ISOLATED Performed at [facility]    Report Status 01/10/2015 FINAL  Final  Urine culture     Status: None   Collection Time: 01/06/15 10:56 AM  Result Value Ref Range Status   Specimen Description URINE, CATHETERIZED  Final   Special Requests Normal  Final   Colony Count   Final    >=100,000 COLONIES/ML Performed at [place] Performed at [facility]   Final   Report Status 01/09/2015 FINAL  Final  Blood Cultures x 2 sites     Status: None (Preliminary result)   Collection Time: 01/10/15 11:28 AM  Result Value Ref Range Status   Specimen Description BLOOD LEFT  ANTECUBITAL  Final   Special Requests BOTTLES DRAWN AEROBIC AND ANAEROBIC 10CC  Final   Culture   Final           BLOOD CULTURE RECEIVED NO GROWTH TO DATE CULTURE WILL BE HELD FOR 5 DAYS BEFORE ISSUING A FINAL NEGATIVE REPORT Performed at [facility]    Report Status PENDING  Incomplete  Blood Cultures x 2 sites     Status: None (Preliminary result)   Collection Time: 01/10/15 11:41 AM  Result Value Ref Range Status   Specimen Description BLOOD LEFT HAND  Final   Special Requests BOTTLES DRAWN AEROBIC ONLY 4CC  Final   Culture   Final           BLOOD CULTURE RECEIVED NO GROWTH TO DATE CULTURE WILL BE HELD FOR 5 DAYS BEFORE ISSUING A FINAL NEGATIVE REPORT Performed at [facility]    Report Status PENDING  Incomplete       Scheduled Meds:  Scheduled Meds: . baclofen  10 mg Oral BID WC  . baclofen  20 mg Oral QHS  . collagenase   Topical Daily  . enoxaparin (LOVENOX) injection  40 mg Subcutaneous Q24H  . feeding supplement (GLUCERNA SHAKE)  237 mL Oral TID BM  . feeding supplement (PRO-STAT SUGAR FREE 64)  30 mL Oral BID  . fentaNYL  25 mcg Transdermal Q72H    . fluconazole (DIFLUCAN) IV  200 mg Intravenous Q24H  . insulin aspart  5 Units Subcutaneous TID WC  . insulin glargine  15 Units Subcutaneous QHS  . loratadine  10 mg Oral Daily  . oxyCODONE-acetaminophen  1 tablet Oral TID PC   And  . oxyCODONE  5 mg Oral TID PC  . pantoprazole  40 mg Oral Daily  . potassium chloride SA  20 mEq Oral Daily  . pregabalin  100 mg Oral TID  . pregabalin  200 mg Oral Q2200  . QUEtiapine  25 mg Oral TID  . tigecycline (TYGACIL) IVPB  50 mg Intravenous Q12H    Time spent on care of this patient: 25 mins   Hakan Nudelman , DO  [facility]  [email or web address] Password [email or web address] 01/14/2015, 11:32 AM   LOS: 8 days

## 2015-01-14 NOTE — Progress Notes (Signed)
Patient discharged home with belongings via [facility] transport.  Assessment unchanged from morning.

## 2015-01-16 LAB — CULTURE, BLOOD (ROUTINE X 2)
Culture: NO GROWTH
Culture: NO GROWTH

## 2015-01-17 ENCOUNTER — Encounter (HOSPITAL_BASED_OUTPATIENT_CLINIC_OR_DEPARTMENT_OTHER): Payer: Medicaid Other | Attending: Plastic Surgery

## 2015-01-19 ENCOUNTER — Ambulatory Visit: Payer: Medicaid Other | Admitting: Family Medicine

## 2015-01-20 ENCOUNTER — Telehealth: Payer: Self-pay | Admitting: Family Medicine

## 2015-01-20 ENCOUNTER — Telehealth: Payer: Self-pay | Admitting: Licensed Clinical Social Worker

## 2015-01-20 ENCOUNTER — Other Ambulatory Visit: Payer: Self-pay

## 2015-01-20 MED ORDER — GLUCOSE BLOOD VI STRP: ORAL_STRIP | Status: DC

## 2015-01-20 NOTE — Telephone Encounter (Signed)
The appt is 5/11 is that ok?

## 2015-01-20 NOTE — Telephone Encounter (Signed)
Jason [name] (pt is in hosp now)

## 2015-01-20 NOTE — Telephone Encounter (Signed)
Call Home Health to resume the previous orders. Resume wound care as before. He needs to follow up with the Wound Clinic and with Infectious Disease to decide about antibiotic treatment.

## 2015-01-20 NOTE — Telephone Encounter (Signed)
I spoke with Santiago Glad at [place] [phone] and gave the below information.

## 2015-01-20 NOTE — Telephone Encounter (Signed)
Lets maintain it till he sees [name] hopefully very soon?

## 2015-01-20 NOTE — Telephone Encounter (Signed)
Olin Hauser, RN with [name] states that the patient's antibiotics ends this Friday and would like to know if the PICC should be removed at that time.

## 2015-01-20 NOTE — Telephone Encounter (Signed)
Pt's father is requesting a refill on the pt's Novolog and Accu-check test strips. Last ov was 06/10/2014.  Please advise, Thanks!

## 2015-01-20 NOTE — Telephone Encounter (Signed)
Lets just PULL the PICC Then that's a bit far out

## 2015-01-20 NOTE — Telephone Encounter (Signed)
Pt was dc'd from hospital 4/8.  Hospital failed to send any nursing orders to [facility].  Pt has not had any dressing changes, blood work, or anything since he left last friday. Dad would like dr fry to help and intervene.

## 2015-01-21 NOTE — Care Management Note (Signed)
CARE MANAGEMENT NOTE 01/21/2015  Patient:  Jason [name], Jason [name]   Account Number:  [id number]  Date Initiated:  01/21/2015  Documentation initiated by:  Jasmine Pang  Subjective/Objective Assessment:   Pt mother calling wiht concerns about pt wound care and tranportation.     Action/Plan:   01/20/2015 Notified by change RN, Maryruth Hancock that pt mother had called with concerns re pt wound care. This CM notified [place] rep, Linus Mako re mothers concerns. Please see note below.   Anticipated DC Date:     Anticipated DC Plan:           Choice offered to / List presented to:             Status of service:   Medicare Important Message given?   (If response is "NO", the following Medicare IM given date fields will be blank) Date Medicare IM given:   Medicare IM given by:   Date Additional Medicare IM given:   Additional Medicare IM given by:    Discharge Disposition:    Per UR Regulation:    If discussed at Long Length of Stay Meetings, dates discussed:    Comments:  01/21/2015 late entry for 01/20/15: [name] rep Stanton Kidney, stated that she had just received an email from this pt [facility], who has a hx of working with this family, expressing concerns that the pt's mother was not changing the dressings as instructed. The [facility] stated that she was concerned that this pt would need hospitalization again soon, as the mother was not able to provide care in the home as needed. [name] rep stated that this pt is part of the HIR program and that she planned to contact [facility], [employer] of Social Work who oversees the [employer] to discuss issues that [name] are having in assuring that this pt is getting adquate care in the home from the mother. 01/21/15 This CM was notifed by 6E dept director, Lorenso Quarry that this pt's mother, Ms Marjorie Lussier, had contacted the office of Pt experience stating that her son was d/c without adequate instructions for wound care. This CM informed T Caple of intervention with [facility]  agency [facility] on 01/20/2015 re Ms Anspach concerns. Will continue to follow.

## 2015-01-24 ENCOUNTER — Encounter: Payer: Self-pay | Admitting: Physical Medicine & Rehabilitation

## 2015-01-24 ENCOUNTER — Telehealth: Payer: Self-pay | Admitting: *Deleted

## 2015-01-24 DIAGNOSIS — G9511 Acute infarction of spinal cord (embolic) (nonembolic): Secondary | ICD-10-CM

## 2015-01-24 DIAGNOSIS — L89159 Pressure ulcer of sacral region, unspecified stage: Secondary | ICD-10-CM

## 2015-01-24 DIAGNOSIS — IMO0002 Reserved for concepts with insufficient information to code with codable children: Secondary | ICD-10-CM

## 2015-01-24 DIAGNOSIS — E1065 Type 1 diabetes mellitus with hyperglycemia: Secondary | ICD-10-CM

## 2015-01-24 DIAGNOSIS — G8250 Quadriplegia, unspecified: Secondary | ICD-10-CM

## 2015-01-24 DIAGNOSIS — E1040 Type 1 diabetes mellitus with diabetic neuropathy, unspecified: Secondary | ICD-10-CM

## 2015-01-24 MED ORDER — FENTANYL 25 MCG/HR TD PT72: 25.0000 ug | MEDICATED_PATCH | TRANSDERMAL | Status: DC

## 2015-01-24 MED ORDER — OXYCODONE-ACETAMINOPHEN 10-325 MG PO TABS: 1.0000 | ORAL_TABLET | Freq: Three times a day (TID) | ORAL | Status: DC

## 2015-01-24 NOTE — Telephone Encounter (Signed)
Spoke with patients mother Jason [name] and told her she can come and pick up the RX this one time tomorrow after 2pm - this is an exception because of the patients transportation problems and recent hospitalization. Printed out script for Dr. To sign.

## 2015-01-24 NOTE — Telephone Encounter (Signed)
[name] called about Jason [name]'s medications.  Recent hospital D/C and  Need his pain medicine refilled.  We offered her an appt for Wednesday 01/26/15 due to Dr. Naaman Plummer open schedule and she said it was not possible due to her transportation issues. I called her back and asked which transportation service she uses, she said it was [facility] and I explained that I would call and speak with them.  She then informed my that she made an appt with Dr. Sarajane Jews, PCP for Wednesday at 1pm and the appt would have to be after 2. Spoke to Dr. Naaman Plummer and he is willing to write Rx's for this month due to patient recent hospital stay.

## 2015-01-26 ENCOUNTER — Ambulatory Visit (INDEPENDENT_AMBULATORY_CARE_PROVIDER_SITE_OTHER): Payer: Medicaid Other | Admitting: Family Medicine

## 2015-01-26 ENCOUNTER — Encounter: Payer: Self-pay | Admitting: Family Medicine

## 2015-01-26 VITALS — BP 80/63 | HR 73 | Temp 97.5°F

## 2015-01-26 DIAGNOSIS — M869 Osteomyelitis, unspecified: Secondary | ICD-10-CM | POA: Diagnosis not present

## 2015-01-26 DIAGNOSIS — J189 Pneumonia, unspecified organism: Secondary | ICD-10-CM

## 2015-01-26 DIAGNOSIS — R7881 Bacteremia: Secondary | ICD-10-CM

## 2015-01-26 DIAGNOSIS — A4150 Gram-negative sepsis, unspecified: Secondary | ICD-10-CM

## 2015-01-26 DIAGNOSIS — L89154 Pressure ulcer of sacral region, stage 4: Secondary | ICD-10-CM | POA: Diagnosis not present

## 2015-01-26 DIAGNOSIS — E1065 Type 1 diabetes mellitus with hyperglycemia: Secondary | ICD-10-CM

## 2015-01-26 MED ORDER — MIDODRINE HCL 5 MG PO TABS: 10.0000 mg | ORAL_TABLET | Freq: Two times a day (BID) | ORAL | Status: DC

## 2015-01-26 NOTE — Progress Notes (Signed)
Pre visit review using our clinic review tool, if applicable. No additional management support is needed unless otherwise documented below in the visit note. Pt unable to stand

## 2015-01-26 NOTE — Progress Notes (Signed)
   Subjective:    Patient ID: Jason [name], male    DOB: 1986-04-02, 29 y.o.   MRN: [id number]  HPI Here with mother to follow up a hospital stay from 01-06-15 to 01-14-15 for sepsis from his sacral decubitus ulcer. He received IV antibiotics under the close management advice of Infectious Disease. Since returning home he has done quite well actually under the close care of his mother. They are changing wound dressings daily, and he will follow up with Dr. Tommy Medal on 02-16-15. He is scheduled to follow up with the Wound Clinic on 02-28-15. He has been breathing normally and has very little chest congestion. No recent fevers. His appetite is improving. His urine has appeared to be clear in his foley bag. He has been started on Midodrine 5 mg twice a day to get his BP up. Part of why he feels so weak has been his very low BPs.    Review of Systems  Constitutional: Positive for fatigue.  Respiratory: Negative.   Cardiovascular: Negative.   Gastrointestinal: Negative.   Genitourinary: Negative.        Objective:   Physical Exam  Constitutional: He is oriented to person, place, and time.  alert  Neck: No thyromegaly present.  Cardiovascular: Normal rate, regular rhythm, normal heart sounds and intact distal pulses.   No murmur heard. Pulmonary/Chest: Effort normal and breath sounds normal. No respiratory distress. He has no wheezes. He has no rales.  Lymphadenopathy:    He has no cervical adenopathy.  Neurological: He is alert and oriented to person, place, and time.          Assessment & Plan:  He seems to be doing well. Stay on current regimen. Home Health nurses are coming to his house 3 days a week to assist with ADLs. He will see Dr. Ephriam Knuckles for a rehab follow up on 01-31-15.

## 2015-01-27 ENCOUNTER — Telehealth: Payer: Self-pay | Admitting: Family Medicine

## 2015-01-31 ENCOUNTER — Encounter: Payer: Medicaid Other | Attending: Registered Nurse | Admitting: Physical Medicine & Rehabilitation

## 2015-01-31 ENCOUNTER — Encounter: Payer: Self-pay | Admitting: Physical Medicine & Rehabilitation

## 2015-01-31 ENCOUNTER — Emergency Department (HOSPITAL_COMMUNITY)
Admission: EM | Admit: 2015-01-31 | Discharge: 2015-02-01 | Disposition: A | Discharge status: Home / Self Care | Payer: Medicaid Other | Attending: Emergency Medicine | Admitting: Emergency Medicine

## 2015-01-31 ENCOUNTER — Other Ambulatory Visit: Payer: Self-pay | Admitting: Physical Medicine & Rehabilitation

## 2015-01-31 ENCOUNTER — Encounter (HOSPITAL_COMMUNITY): Payer: Self-pay | Admitting: Emergency Medicine

## 2015-01-31 VITALS — BP 106/73 | HR 76 | Resp 14

## 2015-01-31 DIAGNOSIS — M1990 Unspecified osteoarthritis, unspecified site: Secondary | ICD-10-CM | POA: Insufficient documentation

## 2015-01-31 DIAGNOSIS — G9519 Other vascular myelopathies: Secondary | ICD-10-CM | POA: Diagnosis present

## 2015-01-31 DIAGNOSIS — J45909 Unspecified asthma, uncomplicated: Secondary | ICD-10-CM | POA: Diagnosis not present

## 2015-01-31 DIAGNOSIS — G8250 Quadriplegia, unspecified: Secondary | ICD-10-CM | POA: Insufficient documentation

## 2015-01-31 DIAGNOSIS — E1041 Type 1 diabetes mellitus with diabetic mononeuropathy: Secondary | ICD-10-CM | POA: Insufficient documentation

## 2015-01-31 DIAGNOSIS — K219 Gastro-esophageal reflux disease without esophagitis: Secondary | ICD-10-CM | POA: Insufficient documentation

## 2015-01-31 DIAGNOSIS — Z8673 Personal history of transient ischemic attack (TIA), and cerebral infarction without residual deficits: Secondary | ICD-10-CM | POA: Insufficient documentation

## 2015-01-31 DIAGNOSIS — Z87891 Personal history of nicotine dependence: Secondary | ICD-10-CM | POA: Diagnosis not present

## 2015-01-31 DIAGNOSIS — E1140 Type 2 diabetes mellitus with diabetic neuropathy, unspecified: Secondary | ICD-10-CM | POA: Diagnosis present

## 2015-01-31 DIAGNOSIS — Z8614 Personal history of Methicillin resistant Staphylococcus aureus infection: Secondary | ICD-10-CM | POA: Diagnosis not present

## 2015-01-31 DIAGNOSIS — Z5181 Encounter for therapeutic drug level monitoring: Secondary | ICD-10-CM | POA: Diagnosis present

## 2015-01-31 DIAGNOSIS — M797 Fibromyalgia: Secondary | ICD-10-CM | POA: Diagnosis not present

## 2015-01-31 DIAGNOSIS — Z933 Colostomy status: Secondary | ICD-10-CM | POA: Insufficient documentation

## 2015-01-31 DIAGNOSIS — G9340 Encephalopathy, unspecified: Secondary | ICD-10-CM

## 2015-01-31 DIAGNOSIS — Z8701 Personal history of pneumonia (recurrent): Secondary | ICD-10-CM | POA: Insufficient documentation

## 2015-01-31 DIAGNOSIS — I959 Hypotension, unspecified: Secondary | ICD-10-CM | POA: Diagnosis present

## 2015-01-31 DIAGNOSIS — Z794 Long term (current) use of insulin: Secondary | ICD-10-CM | POA: Diagnosis not present

## 2015-01-31 DIAGNOSIS — Z872 Personal history of diseases of the skin and subcutaneous tissue: Secondary | ICD-10-CM | POA: Diagnosis not present

## 2015-01-31 DIAGNOSIS — N319 Neuromuscular dysfunction of bladder, unspecified: Secondary | ICD-10-CM | POA: Diagnosis present

## 2015-01-31 DIAGNOSIS — G40909 Epilepsy, unspecified, not intractable, without status epilepticus: Secondary | ICD-10-CM | POA: Diagnosis not present

## 2015-01-31 DIAGNOSIS — N390 Urinary tract infection, site not specified: Secondary | ICD-10-CM

## 2015-01-31 DIAGNOSIS — G894 Chronic pain syndrome: Secondary | ICD-10-CM | POA: Diagnosis not present

## 2015-01-31 DIAGNOSIS — IMO0002 Reserved for concepts with insufficient information to code with codable children: Secondary | ICD-10-CM

## 2015-01-31 DIAGNOSIS — Z79899 Other long term (current) drug therapy: Secondary | ICD-10-CM | POA: Insufficient documentation

## 2015-01-31 DIAGNOSIS — Z880 Allergy status to penicillin: Secondary | ICD-10-CM | POA: Diagnosis not present

## 2015-01-31 DIAGNOSIS — E109 Type 1 diabetes mellitus without complications: Secondary | ICD-10-CM | POA: Insufficient documentation

## 2015-01-31 DIAGNOSIS — E1040 Type 1 diabetes mellitus with diabetic neuropathy, unspecified: Secondary | ICD-10-CM

## 2015-01-31 DIAGNOSIS — G9511 Acute infarction of spinal cord (embolic) (nonembolic): Secondary | ICD-10-CM

## 2015-01-31 DIAGNOSIS — E43 Unspecified severe protein-calorie malnutrition: Secondary | ICD-10-CM

## 2015-01-31 DIAGNOSIS — E1065 Type 1 diabetes mellitus with hyperglycemia: Secondary | ICD-10-CM

## 2015-01-31 LAB — BASIC METABOLIC PANEL
Anion gap: 9 (ref 5–15)
BUN: 12 mg/dL (ref 6–23)
CO2: 21 mmol/L (ref 19–32)
CREATININE: 0.7 mg/dL (ref 0.50–1.35)
Calcium: 11.8 mg/dL — ABNORMAL HIGH (ref 8.4–10.5)
Chloride: 106 mmol/L (ref 96–112)
GFR calc non Af Amer: >90 mL/min (ref >90)
GLUCOSE: 138 mg/dL — AB (ref 70–99)
Potassium: 4.3 mmol/L (ref 3.5–5.1)
Sodium: 136 mmol/L (ref 135–145)

## 2015-01-31 LAB — CBC WITH DIFFERENTIAL/PLATELET
Basophils Absolute: 0.1 10*3/uL (ref 0.0–0.1)
Basophils Relative: 0 % (ref 0–1)
Eosinophils Absolute: 0.3 10*3/uL (ref 0.0–0.7)
Eosinophils Relative: 2 % (ref 0–5)
HEMATOCRIT: 27.1 % — AB (ref 39.0–52.0)
Hemoglobin: 8.3 g/dL — ABNORMAL LOW (ref 13.0–17.0)
Lymphocytes Relative: 14 % (ref 12–46)
Lymphs Abs: 2.1 10*3/uL (ref 0.7–4.0)
MCH: 25.9 pg — ABNORMAL LOW (ref 26.0–34.0)
MCHC: 30.6 g/dL (ref 30.0–36.0)
MCV: 84.4 fL (ref 78.0–100.0)
Monocytes Absolute: 1.7 10*3/uL — ABNORMAL HIGH (ref 0.1–1.0)
Monocytes Relative: 11 % (ref 3–12)
NEUTROS ABS: 11.2 10*3/uL — AB (ref 1.7–7.7)
Neutrophils Relative %: 73 % (ref 43–77)
Platelets: 660 10*3/uL — ABNORMAL HIGH (ref 150–400)
RBC: 3.21 MIL/uL — ABNORMAL LOW (ref 4.22–5.81)
RDW: 14.9 % (ref 11.5–15.5)
WBC: 15.4 10*3/uL — ABNORMAL HIGH (ref 4.0–10.5)

## 2015-01-31 LAB — URINALYSIS, ROUTINE W REFLEX MICROSCOPIC
Bilirubin Urine: NEGATIVE
Glucose, UA: NEGATIVE mg/dL
Ketones, ur: NEGATIVE mg/dL
Nitrite: NEGATIVE
PROTEIN: 30 mg/dL — AB
Specific Gravity, Urine: 1.012 (ref 1.005–1.030)
Urobilinogen, UA: 0.2 mg/dL (ref 0.0–1.0)
pH: 7 (ref 5.0–8.0)

## 2015-01-31 LAB — URINE MICROSCOPIC-ADD ON

## 2015-01-31 MED ORDER — CIPROFLOXACIN HCL 500 MG PO TABS: 500.0000 mg | ORAL_TABLET | Freq: Two times a day (BID) | ORAL | Status: DC

## 2015-01-31 MED ORDER — CIPROFLOXACIN HCL 500 MG PO TABS
500.0000 mg | ORAL_TABLET | Freq: Once | ORAL | Status: AC
  Administered 2015-01-31: 500 mg via ORAL
  Filled 2015-01-31: qty 1

## 2015-01-31 MED ORDER — SODIUM CHLORIDE 0.9 % IV BOLUS (SEPSIS)
1000.0000 mL | Freq: Once | INTRAVENOUS | Status: AC
  Administered 2015-01-31: 1000 mL via INTRAVENOUS

## 2015-01-31 NOTE — Discharge Instructions (Signed)
Urinary Tract Infection Stay well hydrated.  Follow up with your primary care provider.  Return for fever or inability to void. Urinary tract infections (UTIs) can develop anywhere along your urinary tract. Your urinary tract is your body's drainage system for removing wastes and extra water. Your urinary tract includes two kidneys, two ureters, a bladder, and a urethra. Your kidneys are a pair of bean-shaped organs. Each kidney is about the size of your fist. They are located below your ribs, one on each side of your spine. CAUSES Infections are caused by microbes, which are microscopic organisms, including fungi, viruses, and bacteria. These organisms are so small that they can only be seen through a microscope. Bacteria are the microbes that most commonly cause UTIs. SYMPTOMS  Symptoms of UTIs may vary by age and gender of the patient and by the location of the infection. Symptoms in young women typically include a frequent and intense urge to urinate and a painful, burning feeling in the bladder or urethra during urination. Older women and men are more likely to be tired, shaky, and weak and have muscle aches and abdominal pain. A fever may mean the infection is in your kidneys. Other symptoms of a kidney infection include pain in your back or sides below the ribs, nausea, and vomiting. DIAGNOSIS To diagnose a UTI, your caregiver will ask you about your symptoms. Your caregiver also will ask to provide a urine sample. The urine sample will be tested for bacteria and white blood cells. White blood cells are made by your body to help fight infection. TREATMENT  Typically, UTIs can be treated with medication. Because most UTIs are caused by a bacterial infection, they usually can be treated with the use of antibiotics. The choice of antibiotic and length of treatment depend on your symptoms and the type of bacteria causing your infection. HOME CARE INSTRUCTIONS  If you were prescribed antibiotics, take  them exactly as your caregiver instructs you. Finish the medication even if you feel better after you have only taken some of the medication.  Drink enough water and fluids to keep your urine clear or pale yellow.  Avoid caffeine, tea, and carbonated beverages. They tend to irritate your bladder.  Empty your bladder often. Avoid holding urine for long periods of time.  Empty your bladder before and after sexual intercourse.  After a bowel movement, women should cleanse from front to back. Use each tissue only once. SEEK MEDICAL CARE IF:   You have back pain.  You develop a fever.  Your symptoms do not begin to resolve within 3 days. SEEK IMMEDIATE MEDICAL CARE IF:   You have severe back pain or lower abdominal pain.  You develop chills.  You have nausea or vomiting.  You have continued burning or discomfort with urination. MAKE SURE YOU:   Understand these instructions.  Will watch your condition.  Will get help right away if you are not doing well or get worse. Document Released: 07/04/2005 Document Revised: 03/25/2012 Document Reviewed: 11/02/2011 [facility] Patient Information 2015 [place], [place]. This information is not intended to replace advice given to you by your health care provider. Make sure you discuss any questions you have with your health care provider.

## 2015-01-31 NOTE — ED Provider Notes (Signed)
CSN: [id number]     Arrival date & time 01/31/15  1341 History   None    Chief Complaint  Patient presents with  . Hypotension     (Consider location/radiation/quality/duration/timing/severity/associated sxs/prior Treatment) The history is provided by the patient and a parent. No language interpreter was used.   Jason [name] is a 29 y.o black male with a history of DM, seizures, stroke, decubitus ulcers and osteomyelitis who presents by EMS for near syncope while at his pcp prior to arrival in the ED.   He has been dealing with hypotension for the past few weeks and Dr. Sarajane Jews increased his midodrine.  Mom stays he has been more lethargic and not eating or drinking as much. He denies any fever, chills, chest pain, shortness of breath, abdominal pain, or urinary retention.  Past Medical History  Diagnosis Date  . GERD (gastroesophageal reflux disease)   . Asthma   . Hx MRSA infection     on face  . Gastroparesis   . Diabetic neuropathy   . Seizures   . Family history of anesthesia complication     Pt mother can't have epidural procedures  . Dysrhythmia   . Pneumonia   . Arthritis   . Fibromyalgia   . 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 Type I diabetes mellitus     sees Dr. Loanne Drilling    Past Surgical History  Procedure Laterality Date  . Tonsillectomy    . Multiple extractions with alveoloplasty N/A 08/03/2014    Procedure: MULTIPLE EXTRACTIONS;  Surgeon: Gae Bon, DDS;  Location: [place];  Service: Oral Surgery;  Laterality: N/A;  . Tee without cardioversion N/A 08/17/2014    Procedure: TRANSESOPHAGEAL ECHOCARDIOGRAM (TEE);  Surgeon: Dorothy Spark, MD;  Location: [facility] ENDOSCOPY;  Service: Cardiovascular;  Laterality: N/A;  . Debridment of decubitus ulcer N/A 10/04/2014    Procedure: DEBRIDMENT OF DECUBITUS ULCER;  Surgeon: Georganna Skeans, MD;  Location: [place];  Service: General;  Laterality: N/A;  . Laparoscopic diverted colostomy N/A 10/12/2014     Procedure: LAPAROSCOPIC DIVERTING COLOSTOMY;  Surgeon: Donnie Mesa, MD;  Location: [place];  Service: General;  Laterality: N/A;  . Insertion of suprapubic catheter N/A 10/12/2014    Procedure: INSERTION OF SUPRAPUBIC CATHETER;  Surgeon: Reece Packer, MD;  Location: [place];  Service: Urology;  Laterality: N/A;  . Incision and drainage of wound N/A 11/12/2014    Procedure: IRRIGATION AND DEBRIDEMENT OF WOUNDS WITH BONE BIOPSY AND SURGICAL PREP ;  Surgeon: Theodoro Kos, DO;  Location: [place];  Service: Plastics;  Laterality: N/A;  . Application of a-cell of back N/A 11/12/2014    Procedure: APPLICATION A CELL AND VAC ;  Surgeon: Theodoro Kos, DO;  Location: [place];  Service: Plastics;  Laterality: N/A;  . Incision and drainage of wound N/A 11/18/2014    Procedure: IRRIGATION AND DEBRIDEMENT OF SACRAL ULCER ONLY WITH PLACEMENT OF A CELL AND VAC/ DRESSING CHANGE TO UPPER BACK AREA.;  Surgeon: Theodoro Kos, DO;  Location: [place];  Service: Plastics;  Laterality: N/A;  . Incision and drainage of wound N/A 11/25/2014    Procedure: IRRIGATION AND DEBRIDEMENT OF SACRAL ULCER AND BACK BURN WITH PLACEMENT OF A-CELL;  Surgeon: Theodoro Kos, DO;  Location: [place];  Service: Plastics;  Laterality: N/A;  . Minor application of wound vac N/A 11/25/2014    Procedure:  WOUND VAC CHANGE;  Surgeon: Theodoro Kos, DO;  Location: [place];  Service: Plastics;  Laterality: N/A;  . Incision and drainage of wound Right 12/08/2014    Procedure: IRRIGATION AND DEBRIDEMENT SACRAL WOUND AND RIGHT ISCHIAL WOUND ;  Surgeon: Theodoro Kos, DO;  Location: [place];  Service: Plastics;  Laterality: Right;  . Application of a-cell of back N/A 12/08/2014    Procedure: APPLICATION OF A-CELL AND WOUND VAC ;  Surgeon: Theodoro Kos, DO;  Location: [place];  Service: Plastics;  Laterality: N/A;   Family History  Problem Relation Age of Onset  . Diabetes Father   . Hypertension Father   . Asthma      fhx  . Hypertension      fhx  . Stroke       fhx  . Heart disease Mother    History  Substance Use Topics  . Smoking status: Former Smoker -- 12 years    Types: Cigars, Cigarettes    Quit date: 09/07/2014  . Smokeless tobacco: Never Used  . Alcohol Use: No    Review of Systems  Respiratory: Negative for wheezing.   Gastrointestinal: Negative for nausea and vomiting.  Genitourinary: Negative for hematuria.  Neurological: Negative for dizziness, seizures and headaches.  All other systems reviewed and are negative.     Allergies  Cefuroxime axetil; Ertapenem; Morphine and related; Penicillins; Sulfa antibiotics; Tessalon; and Shellfish allergy  Home Medications   Prior to Admission medications   Medication Sig Start Date End Date Taking? Authorizing Provider  acetaminophen (TYLENOL) 325 MG tablet Take 2 tablets (650 mg total) by mouth every 6 (six) hours as needed for mild pain, moderate pain, fever or headache. 01/14/15  Yes Geradine Girt, DO  albuterol (PROVENTIL) (2.5 MG/3ML) 0.083% nebulizer solution Take 3 mLs (2.5 mg total) by nebulization every 4 (four) hours as needed for wheezing or shortness of breath. 04/27/14  Yes [name], MD  baclofen (LIORESAL) 10 MG tablet Take 1-2 tablets (10-20 mg total) by mouth 3 (three) times daily. Takes 10mg  in morning and lunchtime and takes 20mg  at bedtime 11/29/14  Yes Meredith Staggers, MD  collagenase (SANTYL) ointment Apply 1 application topically daily. 08/30/14  Yes [name], MD  dexlansoprazole (DEXILANT) 60 MG capsule Take 1 capsule (60 mg total) by mouth daily. 11/19/14  Yes Shanker Kristeen Mans, MD  diclofenac sodium (VOLTAREN) 1 % GEL Apply 2 g topically daily as needed (for pain). 11/19/14  Yes Shanker Kristeen Mans, MD  feeding supplement, GLUCERNA SHAKE, (GLUCERNA SHAKE) LIQD Take 237 mLs by mouth 3 (three) times daily between meals. 12/10/14  Yes Florencia Reasons, MD  fentaNYL (DURAGESIC - DOSED MCG/HR) 25 MCG/HR patch Place 1 patch (25 mcg total) onto the skin every 3 (three) days.  01/24/15  Yes Meredith Staggers, MD  glucagon (GLUCAGON EMERGENCY) 1 MG injection Inject 1 mg into the vein once as needed. 04/20/14  Yes [name], MD  glucose blood (ACCU-CHEK SMARTVIEW) test strip Use to check blood sugar 4 times daily. 01/20/15  Yes Renato Shin, MD  insulin aspart (NOVOLOG) 100 UNIT/ML injection Inject 5 Units into the skin 3 (three) times daily with meals. 01/14/15  Yes Jessica U Vann, DO  insulin glargine (LANTUS) 100 UNIT/ML injection Inject 0.15 mLs (15 Units total) into the skin at bedtime. 01/14/15  Yes Geradine Girt, DO  lidocaine (XYLOCAINE) 2 % jelly Apply 1 application topically 4 (four) times daily as needed (pain).  01/24/15  Yes Historical Provider, MD  loratadine (CLARITIN) 10 MG tablet Take 10 mg by mouth daily.  Yes Historical Provider, MD  midodrine (PROAMATINE) 5 MG tablet Take 2 tablets (10 mg total) by mouth 2 (two) times daily with a meal. 01/26/15  Yes [name], MD  ondansetron (ZOFRAN ODT) 8 MG disintegrating tablet Take 1 tablet (8 mg total) by mouth every 8 (eight) hours as needed for nausea or vomiting. 12/29/14  Yes [name], MD  oxyCODONE-acetaminophen (PERCOCET) 10-325 MG per tablet Take 1 tablet by mouth every 8 (eight) hours. Patient taking differently: Take 1 tablet by mouth every 8 (eight) hours as needed for pain.  01/24/15  Yes Meredith Staggers, MD  potassium chloride SA (KLOR-CON M20) 20 MEQ tablet Take 1 tablet (20 mEq total) by mouth daily. 10/25/14  Yes [name], MD  pregabalin (LYRICA) 100 MG capsule Take 1-2 capsules (100-200 mg total) by mouth 4 (four) times daily. Take 100 mg by mouth in the morning, take 100 mg by mouth in the afternoon, take 100 mg by mouth in the evening and take 200 mg by mouth at bedtime. Patient taking differently: Take 100 mg by mouth 3 (three) times daily.  11/29/14  Yes Meredith Staggers, MD  pregabalin (LYRICA) 200 MG capsule Take 1 capsule (200 mg total) by mouth at bedtime. 12/10/14  Yes Florencia Reasons, MD   QUEtiapine (SEROQUEL) 25 MG tablet Take 1 tablet (25 mg total) by mouth 3 (three) times daily. 01/14/15  Yes Geradine Girt, DO  sodium chloride (OCEAN) 0.65 % SOLN nasal spray Place 1 spray into both nostrils as needed for congestion.   Yes Historical Provider, MD  tigecycline 50 mg in sodium chloride 0.9 % 100 mL Inject 50 mg into the vein every 12 (twelve) hours. 01/14/15  Yes Geradine Girt, DO  ciprofloxacin (CIPRO) 500 MG tablet Take 1 tablet (500 mg total) by mouth every 12 (twelve) hours. 01/31/15   Malea Swilling Patel-Mills, PA-C  fluconazole (DIFLUCAN) 200 MG tablet Take 1 tablet (200 mg total) by mouth daily. 01/14/15   Jessica U Vann, DO   BP 110/65 mmHg  Pulse 72  Temp(Src) 98.2 F (36.8 C) (Oral)  Resp 16  SpO2 99% Physical Exam  Constitutional: He is oriented to person, place, and time. He appears well-developed.  Non-toxic appearance. He does not appear ill.  Thin. Speaking appropriately and able to explain how he feels.   HENT:  Head: Normocephalic and atraumatic.  Eyes: Conjunctivae are normal.  Neck: Normal range of motion. Neck supple.  Cardiovascular: Normal rate, regular rhythm and normal heart sounds.   Pulmonary/Chest: Effort normal and breath sounds normal. He has no wheezes.  Abdominal: Soft. He exhibits no distension. There is no tenderness.  Colostomy bag in place.  Genitourinary:  Urinary catheter in place.   Musculoskeletal: Normal range of motion.  Two covered ulcerations on the sacrum, and one on the left foot.  Drainage present surrounding the wound.   Neurological: He is alert and oriented to person, place, and time.  Skin: Skin is warm and dry.  Nursing note and vitals reviewed.   ED Course  Procedures (including critical care time) Labs Review Labs Reviewed  CBC WITH DIFFERENTIAL/PLATELET - Abnormal; Notable for the following:    WBC 15.4 (*)    RBC 3.21 (*)    Hemoglobin 8.3 (*)    HCT 27.1 (*)    MCH 25.9 (*)    Platelets 660 (*)    Neutro Abs 11.2  (*)    Monocytes Absolute 1.7 (*)    All  other components within normal limits  BASIC METABOLIC PANEL - Abnormal; Notable for the following:    Glucose, Bld 138 (*)    Calcium 11.8 (*)    All other components within normal limits  URINALYSIS, ROUTINE W REFLEX MICROSCOPIC - Abnormal; Notable for the following:    APPearance TURBID (*)    Hgb urine dipstick MODERATE (*)    Protein, ur 30 (*)    Leukocytes, UA LARGE (*)    All other components within normal limits  URINE MICROSCOPIC-ADD ON    Imaging Review No results found.   EKG Interpretation None      MDM   Final diagnoses:  Acute UTI  Patient has a history of DM, seizures, stroke, decubitus ulcers and osteomyelitis who presents by EMS for near syncope while at pcp earlier today. He is hypotensive according to mom and per note by his pcp visit today.  His PCP, Dr. Sarajane Jews recently increased his midrodine due to hypotension. He had a bag of fluids hanging on exam.  I ordered additional fluids. He has an elevated WBC count of 15.4 but this is consistently elevated per previous labs. He does have a UTI but this is most likely positive since he has a catheter.  He is mentating appropriately and non toxic appearing.  He is afebrile and mom states he is able to take pills at home for UTI. I will treat him anyway.  He has several ulcers including a necrotic ulcer on the sacrum and over the ischial tuberosity. He also has an ulcer on the left foot.  Mom states they are draining and home health has been changing the dressing.  It was last changed yesterday and today by the ED nurse.  16:45 Spoke to lab regarding pending electrolytes.  Should be resulting soon. Labs are not concerning. I think his near syncopal episode is likely from his hypotension. I discussed return precautions with mom and patient such as fever, syncope, or dehydration. I will put him on cipro since he is allergic to cephalosporins and have him follow up with his pcp. They  agree with the plan.   19:00 Patient is still in the ED waiting for a ride back home. He is sitting with mom and drinking and eating fast food.   Jason Glazier, PA-C 01/31/15 2255  Charlesetta Shanks, MD 02/05/15 [phone]

## 2015-01-31 NOTE — ED Notes (Signed)
Applied wet to dry dsg to open area to lt lateral back. Noted greenish drainage. Applied wet to dry dsg to Stg 4 coccyx and sacrum wounds with greenish and serosanguineous drainage noted. Pt tolerated well.

## 2015-01-31 NOTE — ED Notes (Signed)
When I tried sitting his head up more in the bed he felt like he could faint

## 2015-01-31 NOTE — ED Notes (Signed)
Awake. Verbally responsive. A/O x4. Resp even and unlabored. No audible adventitious breath sounds noted. ABC's intact. Family at bedside. IV saline lock patent and intact.

## 2015-01-31 NOTE — ED Notes (Signed)
Blanch and I cleaned patient up, clean  Clothes aswell. She is changing all the dressings

## 2015-01-31 NOTE — ED Notes (Signed)
Awake. Verbally responsive. A/O x4. Resp even and unlabored. No audible adventitious breath sounds noted. ABC's intact. IV saline lock patent and intact. Family at bedside. 

## 2015-01-31 NOTE — ED Notes (Addendum)
Pt arrived via EMS with report of hypotension and near syncope episode. Pt was seen at PMD's office today when episode occurred. Pt has hx of hypotension and recently change hypotension BP medication on Wednesday. Pt reported that the symptoms have resolved. Family reported that pt's has 3 ulcers and requesting ED MD to check d/t foul odor and increase drainage of yellowish green.

## 2015-01-31 NOTE — Progress Notes (Signed)
Subjective:    Patient ID: Jason [name], male    DOB: May 15, 1986, 29 y.o.   MRN: [id number]  HPI   Jason [name] is here in follow up of his SCI and ongoing pain. He has had more difficulties with hypotension over the last couple weeks. Dr. Sarajane Jews increased his midodrine last week as his systolic was in the 62'E. This morning he had a dizzy spell and he almost passed out. He has been lethargic all day. His mom has been    Pain Inventory Average Pain 9 Pain Right Now 9 My pain is sharp, burning and aching  In the last 24 hours, has pain interfered with the following? General activity 1 Relation with others 3 Enjoyment of life 3 What TIME of day is your pain at its worst? morning, night Sleep (in general) Fair  Pain is worse with: sitting and inactivity Pain improves with: medication Relief from Meds: 7  Mobility ability to climb steps?  no do you drive?  no use a wheelchair needs help with transfers  Function disabled: date disabled 01/29/2014 I need assistance with the following:  feeding, dressing, bathing, toileting, meal prep and household duties  Neuro/Psych No problems in this area  Prior Studies Any changes since last visit?  no  Physicians involved in your care Any changes since last visit?  no   Family History  Problem Relation Age of Onset  . Diabetes Father   . Hypertension Father   . Asthma      fhx  . Hypertension      fhx  . Stroke      fhx  . Heart disease Mother    History   Social History  . Marital Status: Single    Spouse Name: N/A  . Number of Children: 0  . Years of Education: 11   Occupational History  . Disability    Social History Main Topics  . Smoking status: Former Smoker -- 12 years    Types: Cigars, Cigarettes    Quit date: 09/07/2014  . Smokeless tobacco: Never Used  . Alcohol Use: No  . Drug Use: No  . Sexual Activity: Not Currently   Other Topics Concern  . None   Social History Narrative   Patient lives at  home with mother father.    Patient has 2 children.    Patient is single.    Patient was left hand but after stroke patient is now right handed.    Patient has  11th grade education.    Past Surgical History  Procedure Laterality Date  . Tonsillectomy    . Multiple extractions with alveoloplasty N/A 08/03/2014    Procedure: MULTIPLE EXTRACTIONS;  Surgeon: Gae Bon, DDS;  Location: [place];  Service: Oral Surgery;  Laterality: N/A;  . Tee without cardioversion N/A 08/17/2014    Procedure: TRANSESOPHAGEAL ECHOCARDIOGRAM (TEE);  Surgeon: Dorothy Spark, MD;  Location: [facility] ENDOSCOPY;  Service: Cardiovascular;  Laterality: N/A;  . Debridment of decubitus ulcer N/A 10/04/2014    Procedure: DEBRIDMENT OF DECUBITUS ULCER;  Surgeon: Georganna Skeans, MD;  Location: [place];  Service: General;  Laterality: N/A;  . Laparoscopic diverted colostomy N/A 10/12/2014    Procedure: LAPAROSCOPIC DIVERTING COLOSTOMY;  Surgeon: Donnie Mesa, MD;  Location: [place];  Service: General;  Laterality: N/A;  . Insertion of suprapubic catheter N/A 10/12/2014    Procedure: INSERTION OF SUPRAPUBIC CATHETER;  Surgeon: Reece Packer, MD;  Location: [place];  Service: Urology;  Laterality: N/A;  .  Incision and drainage of wound N/A 11/12/2014    Procedure: IRRIGATION AND DEBRIDEMENT OF WOUNDS WITH BONE BIOPSY AND SURGICAL PREP ;  Surgeon: Theodoro Kos, DO;  Location: [place];  Service: Plastics;  Laterality: N/A;  . Application of a-cell of back N/A 11/12/2014    Procedure: APPLICATION A CELL AND VAC ;  Surgeon: Theodoro Kos, DO;  Location: [place];  Service: Plastics;  Laterality: N/A;  . Incision and drainage of wound N/A 11/18/2014    Procedure: IRRIGATION AND DEBRIDEMENT OF SACRAL ULCER ONLY WITH PLACEMENT OF A CELL AND VAC/ DRESSING CHANGE TO UPPER BACK AREA.;  Surgeon: Theodoro Kos, DO;  Location: [place];  Service: Plastics;  Laterality: N/A;  . Incision and drainage of wound N/A 11/25/2014    Procedure: IRRIGATION AND  DEBRIDEMENT OF SACRAL ULCER AND BACK BURN WITH PLACEMENT OF A-CELL;  Surgeon: Theodoro Kos, DO;  Location: [place];  Service: Plastics;  Laterality: N/A;  . Minor application of wound vac N/A 11/25/2014    Procedure:  WOUND VAC CHANGE;  Surgeon: Theodoro Kos, DO;  Location: [place];  Service: Plastics;  Laterality: N/A;  . Incision and drainage of wound Right 12/08/2014    Procedure: IRRIGATION AND DEBRIDEMENT SACRAL WOUND AND RIGHT ISCHIAL WOUND ;  Surgeon: Theodoro Kos, DO;  Location: [place];  Service: Plastics;  Laterality: Right;  . Application of a-cell of back N/A 12/08/2014    Procedure: APPLICATION OF A-CELL AND WOUND VAC ;  Surgeon: Theodoro Kos, DO;  Location: [place];  Service: Plastics;  Laterality: N/A;   Past Medical History  Diagnosis Date  . GERD (gastroesophageal reflux disease)   . Asthma   . Hx MRSA infection     on face  . Gastroparesis   . Diabetic neuropathy   . Seizures   . Family history of anesthesia complication     Pt mother can't have epidural procedures  . Dysrhythmia   . Pneumonia   . Arthritis   . Fibromyalgia   . 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 Type I diabetes mellitus     sees Dr. Loanne Drilling    BP 106/73 mmHg  Pulse 76  Resp 14  SpO2 99%  Opioid Risk Score:   Fall Risk Score: Moderate Fall Risk (6-13 points)`1  Depression screen PHQ 2/9  No flowsheet data found.   Review of Systems  All other systems reviewed and are negative.      Objective:   Physical Exam  HEENT:  Cardio: RRR and no murmur  Resp: CTA B/L and unlabored  GI: BS positive and mildly distended and tympanitic, mild tenderness  Extremity: No Edema  Skin: sacral wound dressed. His back dressing is soaked with a foul smelling, slough appearing drainage with underlying breakdown underneath (appears somewhat shallow).  Neuro: pt is lethargic, unable to stay awake. Became unresponsive on a few occasions when he then awoke and became tearful.  Abnormal  Sensory absent in feet and Abnormal Motor 0/5 in LLE RLE: tr to 1/5 at knee. ,tr 1/5 finger extension and tr hand intrinsic bilaterally. Wrist extension 4+ to 5/5 right and 3- on left, 4/5 tricep right, 2+ left, biceps 5/5. Decreased sensation below elbows bilaterally, minimal pain/LT at left hand. Poor trunk control. Posture poor.  Musc/Skel: Generalized upper thoracic and lumbar spine tenderness. He sits with poor/slumped posture in his w/c. His seat belt for w/c is about 6 inches too small (at least!).  Gen NAD. He has  lost considerable weight. lethargic Psych: fairly alert, appropriate  GU: foley in place. Urine cloudy with sediment.    Assessment/Plan:  1. 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 Chronic Pain Management: (HOLD ALL NEUROSEDATING UNTIL MORE ALERT AS MOTHER HAS BEEN DOING) - baclofen for muscle spasms---10mg  bid and 20mg  qhs  -lyrica 100mg  tid and 200mg  qhs  -can continue with percocet 10/325 one q8 prn #90  -fentanyl patch 88mcg. Although, it's not my first choice given his skin issues, it seems to be working for him. Also, if he becomes ill, he can still receive the medication because it's transdermal.  7. DM type 2 with gastroparesis: continues to be brittle---follow up per endocrine/pcp  -needs better control  8. Chronic wounds: home health continues to follow open areas  -iv abx per ID  -local care per wound care nurse.  -reviewed the importance of better nutrition---latest albumin 1.9!!!  9. Constipation/diabetic gastroparesis/neurogenic bowel. ostomy  10. Severe diabetic peripheral neuropathy: lyrica---continue 100mg  TID and 200mg  QHS  11. Pulmonary: deep breathing, IS/FV, CPAP .  -quad cough, assistive techniques  13. Neurogenic bladder: foley    Lethargy/unresponsive episodes: Jason [name] has lost NOTICEABLE weight since I last saw him. He has worsening hypotension over the last week. He  became unresponsive a few times in the exam room today. His wounds continue to display copious drainage (with the mid back area appearing more involved). He needs to be worked up for symptoms. DDX include dehydration, electrolyte abnormalities, and/or ID source for presentation. EMS was contacted, and the patient was taken to the ED for further assessment.

## 2015-01-31 NOTE — ED Notes (Signed)
Awake. Verbally responsive. A/O x4. Resp even and unlabored. No audible adventitious breath sounds noted. ABC's intact.  

## 2015-01-31 NOTE — ED Notes (Signed)
Called [facility]. Patient is still on the list. [facility] is backed up.

## 2015-01-31 NOTE — ED Notes (Signed)
Bed: WA10 Expected date:  Expected time:  Means of arrival:  Comments: EMS-hypotension 

## 2015-02-01 LAB — PMP ALCOHOL METABOLITE (ETG): Ethyl Glucuronide (EtG): NEGATIVE ng/mL

## 2015-02-03 LAB — URINE CULTURE: Colony Count: >=100000

## 2015-02-04 LAB — OPIATES/OPIOIDS (LC/MS-MS)
CODEINE URINE: NEGATIVE ng/mL (ref <50)
HYDROCODONE: NEGATIVE ng/mL (ref <50)
Hydromorphone: NEGATIVE ng/mL (ref <50)
Morphine Urine: NEGATIVE ng/mL (ref <50)
NORHYDROCODONE, UR: NEGATIVE ng/mL (ref <50)
NOROXYCODONE, UR: 1356 ng/mL (ref <50)
OXYMORPHONE, URINE: 6600 ng/mL (ref <50)
Oxycodone, ur: 4523 ng/mL (ref <50)

## 2015-02-04 LAB — OXYCODONE, URINE (LC/MS-MS)
NOROXYCODONE, UR: 1356 ng/mL (ref <50)
OXYCODONE, UR: 4523 ng/mL (ref <50)
OXYMORPHONE, URINE: 6600 ng/mL (ref <50)

## 2015-02-04 LAB — FENTANYL (GC/LC/MS), URINE
FENTANYL (GC/MS) CONFIRM: 12.6 ng/mL (ref <0.5)
Norfentanyl, confirm: 55.3 ng/mL (ref <0.5)

## 2015-02-05 ENCOUNTER — Telehealth: Payer: Self-pay | Admitting: Emergency Medicine

## 2015-02-05 LAB — PRESCRIPTION MONITORING PROFILE (SOLSTAS)
AMPHETAMINE/METH: NEGATIVE ng/mL
Barbiturate Screen, Urine: NEGATIVE ng/mL
Benzodiazepine Screen, Urine: NEGATIVE ng/mL
Buprenorphine, Urine: NEGATIVE ng/mL
CANNABINOID SCRN UR: NEGATIVE ng/mL
COCAINE METABOLITES: NEGATIVE ng/mL
CREATININE, URINE: 48.1 mg/dL (ref >20.0)
Carisoprodol, Urine: NEGATIVE ng/mL
MDMA URINE: NEGATIVE ng/mL
MEPERIDINE UR: NEGATIVE ng/mL
METHADONE SCREEN, URINE: NEGATIVE ng/mL
Nitrites, Initial: NEGATIVE ug/mL
PROPOXYPHENE: NEGATIVE ng/mL
Tapentadol, urine: NEGATIVE ng/mL
Tramadol Scrn, Ur: NEGATIVE ng/mL
ZOLPIDEM, URINE: NEGATIVE ng/mL
pH, Initial: 7.8 pH (ref 4.5–8.9)

## 2015-02-05 NOTE — Telephone Encounter (Signed)
Post ED Visit - Positive Culture Follow-up  Culture report reviewed by antimicrobial stewardship pharmacist: []  Wes [place], Pharm.D., BCPS []  Heide Guile, Pharm.D., BCPS []  Alycia Rossetti, Pharm.D., BCPS [x]  [place], Pharm.D., BCPS, AAHIVP []  Legrand [place]o, Pharm.D., BCPS, AAHIVP []  Isac Sarna, Pharm.D., BCPS  Positive Urine culture Treated with Ciprofloxacin, organism sensitive to the same and no further patient follow-up is required at this time.  Ernesta Amble 02/05/2015, 10:11 AM

## 2015-02-10 NOTE — Progress Notes (Signed)
Urine drug screen for this encounter is consistent for prescribed medication 

## 2015-02-16 ENCOUNTER — Telehealth: Payer: Self-pay | Admitting: Infectious Diseases

## 2015-02-16 ENCOUNTER — Encounter: Payer: Self-pay | Admitting: Infectious Disease

## 2015-02-16 ENCOUNTER — Ambulatory Visit (INDEPENDENT_AMBULATORY_CARE_PROVIDER_SITE_OTHER): Payer: Medicaid Other | Admitting: Infectious Disease

## 2015-02-16 ENCOUNTER — Telehealth: Payer: Self-pay | Admitting: Infectious Disease

## 2015-02-16 VITALS — BP 85/57 | HR 79 | Temp 97.6°F | Ht 68.0 in | Wt 112.0 lb

## 2015-02-16 DIAGNOSIS — B961 Klebsiella pneumoniae [K. pneumoniae] as the cause of diseases classified elsewhere: Secondary | ICD-10-CM | POA: Diagnosis not present

## 2015-02-16 DIAGNOSIS — R55 Syncope and collapse: Secondary | ICD-10-CM | POA: Insufficient documentation

## 2015-02-16 DIAGNOSIS — G9519 Other vascular myelopathies: Secondary | ICD-10-CM

## 2015-02-16 DIAGNOSIS — N319 Neuromuscular dysfunction of bladder, unspecified: Secondary | ICD-10-CM

## 2015-02-16 DIAGNOSIS — A419 Sepsis, unspecified organism: Secondary | ICD-10-CM | POA: Diagnosis present

## 2015-02-16 DIAGNOSIS — M869 Osteomyelitis, unspecified: Secondary | ICD-10-CM | POA: Diagnosis not present

## 2015-02-16 DIAGNOSIS — E1041 Type 1 diabetes mellitus with diabetic mononeuropathy: Secondary | ICD-10-CM | POA: Diagnosis not present

## 2015-02-16 DIAGNOSIS — A498 Other bacterial infections of unspecified site: Secondary | ICD-10-CM

## 2015-02-16 DIAGNOSIS — G9511 Acute infarction of spinal cord (embolic) (nonembolic): Secondary | ICD-10-CM

## 2015-02-16 DIAGNOSIS — E1040 Type 1 diabetes mellitus with diabetic neuropathy, unspecified: Secondary | ICD-10-CM

## 2015-02-16 DIAGNOSIS — E1065 Type 1 diabetes mellitus with hyperglycemia: Secondary | ICD-10-CM

## 2015-02-16 DIAGNOSIS — IMO0002 Reserved for concepts with insufficient information to code with codable children: Secondary | ICD-10-CM

## 2015-02-16 HISTORY — DX: Syncope and collapse: R55

## 2015-02-16 LAB — CBC WITH DIFFERENTIAL/PLATELET
Basophils Absolute: 0.2 10*3/uL — ABNORMAL HIGH (ref 0.0–0.1)
Basophils Relative: 1 % (ref 0–1)
EOS ABS: 0.6 10*3/uL (ref 0.0–0.7)
Eosinophils Relative: 3 % (ref 0–5)
HCT: 25.6 % — ABNORMAL LOW (ref 39.0–52.0)
HEMOGLOBIN: 8 g/dL — AB (ref 13.0–17.0)
LYMPHS PCT: 11 % — AB (ref 12–46)
Lymphs Abs: 2.3 10*3/uL (ref 0.7–4.0)
MCH: 24.8 pg — ABNORMAL LOW (ref 26.0–34.0)
MCHC: 31.3 g/dL (ref 30.0–36.0)
MCV: 79.5 fL (ref 78.0–100.0)
Monocytes Absolute: 1.9 10*3/uL — ABNORMAL HIGH (ref 0.1–1.0)
Monocytes Relative: 9 % (ref 3–12)
NEUTROS ABS: 15.7 10*3/uL — AB (ref 1.7–7.7)
Neutrophils Relative %: 76 % (ref 43–77)
PLATELETS: 951 10*3/uL — AB (ref 150–400)
RBC: 3.22 MIL/uL — ABNORMAL LOW (ref 4.22–5.81)
RDW: 14.7 % (ref 11.5–15.5)
WBC: 20.7 10*3/uL — ABNORMAL HIGH (ref 4.0–10.5)

## 2015-02-16 LAB — COMPREHENSIVE METABOLIC PANEL
ALT: 21 U/L (ref 17–63)
ANION GAP: 9 (ref 5–15)
AST: 13 U/L — ABNORMAL LOW (ref 15–41)
Albumin: 2 g/dL — ABNORMAL LOW (ref 3.5–5.0)
Alkaline Phosphatase: 130 U/L — ABNORMAL HIGH (ref 38–126)
BUN: 15 mg/dL (ref 6–20)
CHLORIDE: 101 mmol/L (ref 101–111)
CO2: 19 mmol/L — ABNORMAL LOW (ref 22–32)
Calcium: 11.6 mg/dL — ABNORMAL HIGH (ref 8.9–10.3)
Creatinine, Ser: 0.77 mg/dL (ref 0.61–1.24)
GLUCOSE: 214 mg/dL — AB (ref 70–99)
Potassium: 4.7 mmol/L (ref 3.5–5.1)
Sodium: 129 mmol/L — ABNORMAL LOW (ref 135–145)
TOTAL PROTEIN: 6.7 g/dL (ref 6.5–8.1)
Total Bilirubin: 0.5 mg/dL (ref 0.3–1.2)

## 2015-02-16 LAB — C-REACTIVE PROTEIN: CRP: 13.1 mg/dL — AB (ref <0.60)

## 2015-02-16 LAB — SEDIMENTATION RATE: Sed Rate: 52 mm/hr — ABNORMAL HIGH (ref 0–16)

## 2015-02-16 NOTE — Telephone Encounter (Signed)
Called by lab that pt had PLT count greater than 900k.  Dr Derek Mound note reviewed.

## 2015-02-16 NOTE — Telephone Encounter (Signed)
I called and left phone message on home phone and mobile phone conveying my concern that Mr Jason [name] has severe infection "brewing" with WBC up to 20K, platelets up to nearly a million, his hx of bloody drainage from wound, not getting proper wound care and his episodes of "blacking out."  I would recommend that he come in via th ED if not tonight, then tomorrow.  I am very worried that this will deteriorate into a septic picture as it has before

## 2015-02-16 NOTE — Progress Notes (Signed)
Subjective:    Patient ID: Jason [name], male    DOB: 20-Dec-1985, 29 y.o.   MRN: [id number]  HPI  29 y.o. male male with paraplegia chronic sacral decubitus ulcerations and chronic osteomyelitis status post multiple debridements by Dr. Migdalia Dk multiple courses of antibiotics who continues with the another cycle of admission for "sepsis' in April when I saw him last. He was dc to complete tygacil for 2 additional weeks post discharge form the hospital.  Per Mom pt was still having fevers at DC and she was uncomfortable with his DC. The patient was DC apparently without wound care instructions but eventually with help from PCP and Rehab Medicine pt back to wet to dry dressings. He is to see Wound Care clinic next Monday.  Since DC patient has been having copious drainage that has been bloody and at times foul smelling. Additionally he has now been having spells of "blacking out" when sitting up despite escalation of his midodrine. His Mom states that pt did not have this problem before this admission.   He had been sent by Dr [name] to the ED after having become unresponsive in his office. He was diagnosed with UTI in the ED and sent out on Cipro on 01/31/15.  Despite this he continues to have these "black out spells."  When I examined him with his Mom he was also less responsive than I would expect for an outpatient visit.   When I offered to examine his wounds he wanted to wait until Monday when he would be seen in wound clinic.  I told him and his mother that I was very concerned that he was having the copious bloody drainage from his decubitus ulcer and more importantly that he was having these "black out spells."  I advisedd that perhaps most prudent thing would be to admit pt directly to the hospital but after discussion they wished to proceed with only getting lab.  Review of Systems  Constitutional: Positive for fatigue and unexpected weight change. Negative for fever, chills,  diaphoresis, activity change and appetite change.  HENT: Negative for congestion, sore throat and trouble swallowing.   Respiratory: Negative for cough, shortness of breath, wheezing and stridor.   Cardiovascular: Negative for chest pain, palpitations and leg swelling.  Gastrointestinal: Negative for abdominal distention.  Genitourinary: Negative for dysuria, hematuria, flank pain and difficulty urinating.  Musculoskeletal: Negative for back pain and arthralgias.  Skin: Negative for wound.  Neurological: Positive for tremors and light-headedness. Negative for dizziness.  Hematological: Negative for adenopathy. Bruises/bleeds easily.  Psychiatric/Behavioral: Positive for confusion. Negative for behavioral problems, sleep disturbance, dysphoric mood, decreased concentration and agitation.       Objective:   Physical Exam  Constitutional: He appears lethargic.  HENT:  Head: Normocephalic and atraumatic.  Eyes: Conjunctivae and EOM are normal.  Neck: Normal range of motion. Neck supple.  Cardiovascular: Normal rate, regular rhythm and normal heart sounds.   Pulmonary/Chest: Effort normal. No respiratory distress. He has no wheezes.  Abdominal: Soft. He exhibits no distension.  Neurological: He appears lethargic.  Seems to "tune out" but also was wearing his head phones. When engaged in conversation could answer questions.  Skin: Skin is warm and dry.  Psychiatric: He has a normal mood and affect.  Nursing note and vitals reviewed.   I DID not examine his decubitus ulcers.   His suprapubic catheter bag with sediment that was "typical" for him per Mom      Assessment & Plan:  40 yo with paraplegia, sacral decubitus ulcer, chronic pelvic osteo, recurrent UTIs, recurrent admissions for sepsis presents for followup in our clinic  I am VERY disturbed by his presyncopal and syncopal episodes with merely sitting up coupled with decline after DC with increased drainage from his decubitus  ulcer  I was emphatic to the patient and Mom that I am concerned that he might evolve into a septic picture again and be admitted yet again and that safest thing might be to admit him as much as he dislikes being admitted to the hospital  We came up with agreement that they would have very low threshold for readmission to the hospital and that I would order stat labs.  I personally do not think the patient can likely get the wound care he needs at home DESPITE the [place] intentions of the MOm who "bends over backward" to care for her son but I know from conversations with her that pt is not compliant with frequent turning etc.  It may be a very wise mood to have a palliative care consult int he future concerning goals of care as I do not see him doing well unless something dramatically changes     I spent greater than 25 minutes with the patient including greater than 50% of time in face to face counsel of the patient and his Mom re his overal condition and recurrent admissions. and in coordination of their care.

## 2015-02-18 ENCOUNTER — Inpatient Hospital Stay (HOSPITAL_COMMUNITY)
Admission: EM | Admit: 2015-02-18 | Discharge: 2015-03-06 | DRG: 539 | Disposition: A | Discharge status: Home / Self Care | Payer: Medicaid Other | Attending: Family Medicine | Admitting: Family Medicine

## 2015-02-18 ENCOUNTER — Encounter (HOSPITAL_COMMUNITY): Payer: Self-pay | Admitting: Emergency Medicine

## 2015-02-18 DIAGNOSIS — Z933 Colostomy status: Secondary | ICD-10-CM | POA: Diagnosis not present

## 2015-02-18 DIAGNOSIS — N319 Neuromuscular dysfunction of bladder, unspecified: Secondary | ICD-10-CM | POA: Diagnosis present

## 2015-02-18 DIAGNOSIS — M533 Sacrococcygeal disorders, not elsewhere classified: Secondary | ICD-10-CM | POA: Insufficient documentation

## 2015-02-18 DIAGNOSIS — D649 Anemia, unspecified: Secondary | ICD-10-CM | POA: Diagnosis present

## 2015-02-18 DIAGNOSIS — E871 Hypo-osmolality and hyponatremia: Secondary | ICD-10-CM | POA: Diagnosis present

## 2015-02-18 DIAGNOSIS — E1040 Type 1 diabetes mellitus with diabetic neuropathy, unspecified: Secondary | ICD-10-CM | POA: Diagnosis present

## 2015-02-18 DIAGNOSIS — M1990 Unspecified osteoarthritis, unspecified site: Secondary | ICD-10-CM | POA: Diagnosis present

## 2015-02-18 DIAGNOSIS — M869 Osteomyelitis, unspecified: Principal | ICD-10-CM | POA: Diagnosis present

## 2015-02-18 DIAGNOSIS — T798XXA Other early complications of trauma, initial encounter: Secondary | ICD-10-CM

## 2015-02-18 DIAGNOSIS — D6489 Other specified anemias: Secondary | ICD-10-CM

## 2015-02-18 DIAGNOSIS — G8250 Quadriplegia, unspecified: Secondary | ICD-10-CM

## 2015-02-18 DIAGNOSIS — Z515 Encounter for palliative care: Secondary | ICD-10-CM | POA: Diagnosis not present

## 2015-02-18 DIAGNOSIS — Z8639 Personal history of other endocrine, nutritional and metabolic disease: Secondary | ICD-10-CM

## 2015-02-18 DIAGNOSIS — Z888 Allergy status to other drugs, medicaments and biological substances status: Secondary | ICD-10-CM

## 2015-02-18 DIAGNOSIS — K219 Gastro-esophageal reflux disease without esophagitis: Secondary | ICD-10-CM | POA: Diagnosis present

## 2015-02-18 DIAGNOSIS — I2699 Other pulmonary embolism without acute cor pulmonale: Secondary | ICD-10-CM | POA: Diagnosis present

## 2015-02-18 DIAGNOSIS — G4700 Insomnia, unspecified: Secondary | ICD-10-CM | POA: Diagnosis present

## 2015-02-18 DIAGNOSIS — L89890 Pressure ulcer of other site, unstageable: Secondary | ICD-10-CM | POA: Diagnosis present

## 2015-02-18 DIAGNOSIS — E1011 Type 1 diabetes mellitus with ketoacidosis with coma: Secondary | ICD-10-CM | POA: Diagnosis not present

## 2015-02-18 DIAGNOSIS — L89214 Pressure ulcer of right hip, stage 4: Secondary | ICD-10-CM | POA: Diagnosis present

## 2015-02-18 DIAGNOSIS — L89154 Pressure ulcer of sacral region, stage 4: Secondary | ICD-10-CM | POA: Diagnosis present

## 2015-02-18 DIAGNOSIS — E1110 Type 2 diabetes mellitus with ketoacidosis without coma: Secondary | ICD-10-CM | POA: Diagnosis present

## 2015-02-18 DIAGNOSIS — T148XXA Other injury of unspecified body region, initial encounter: Secondary | ICD-10-CM

## 2015-02-18 DIAGNOSIS — J9601 Acute respiratory failure with hypoxia: Secondary | ICD-10-CM | POA: Diagnosis present

## 2015-02-18 DIAGNOSIS — Z8614 Personal history of Methicillin resistant Staphylococcus aureus infection: Secondary | ICD-10-CM

## 2015-02-18 DIAGNOSIS — L89622 Pressure ulcer of left heel, stage 2: Secondary | ICD-10-CM | POA: Diagnosis present

## 2015-02-18 DIAGNOSIS — J189 Pneumonia, unspecified organism: Secondary | ICD-10-CM | POA: Diagnosis present

## 2015-02-18 DIAGNOSIS — R509 Fever, unspecified: Secondary | ICD-10-CM | POA: Diagnosis not present

## 2015-02-18 DIAGNOSIS — E1065 Type 1 diabetes mellitus with hyperglycemia: Secondary | ICD-10-CM

## 2015-02-18 DIAGNOSIS — E1010 Type 1 diabetes mellitus with ketoacidosis without coma: Secondary | ICD-10-CM | POA: Diagnosis present

## 2015-02-18 DIAGNOSIS — Z87891 Personal history of nicotine dependence: Secondary | ICD-10-CM

## 2015-02-18 DIAGNOSIS — D473 Essential (hemorrhagic) thrombocythemia: Secondary | ICD-10-CM | POA: Diagnosis present

## 2015-02-18 DIAGNOSIS — J45909 Unspecified asthma, uncomplicated: Secondary | ICD-10-CM | POA: Diagnosis present

## 2015-02-18 DIAGNOSIS — L89159 Pressure ulcer of sacral region, unspecified stage: Secondary | ICD-10-CM | POA: Diagnosis not present

## 2015-02-18 DIAGNOSIS — Z882 Allergy status to sulfonamides status: Secondary | ICD-10-CM

## 2015-02-18 DIAGNOSIS — L8990 Pressure ulcer of unspecified site, unspecified stage: Secondary | ICD-10-CM | POA: Insufficient documentation

## 2015-02-18 DIAGNOSIS — D500 Iron deficiency anemia secondary to blood loss (chronic): Secondary | ICD-10-CM | POA: Diagnosis not present

## 2015-02-18 DIAGNOSIS — IMO0002 Reserved for concepts with insufficient information to code with codable children: Secondary | ICD-10-CM

## 2015-02-18 DIAGNOSIS — E43 Unspecified severe protein-calorie malnutrition: Secondary | ICD-10-CM | POA: Diagnosis present

## 2015-02-18 DIAGNOSIS — I9589 Other hypotension: Secondary | ICD-10-CM | POA: Diagnosis present

## 2015-02-18 DIAGNOSIS — E1069 Type 1 diabetes mellitus with other specified complication: Secondary | ICD-10-CM | POA: Diagnosis not present

## 2015-02-18 DIAGNOSIS — T798XXS Other early complications of trauma, sequela: Secondary | ICD-10-CM | POA: Diagnosis not present

## 2015-02-18 DIAGNOSIS — B9689 Other specified bacterial agents as the cause of diseases classified elsewhere: Secondary | ICD-10-CM | POA: Diagnosis not present

## 2015-02-18 DIAGNOSIS — Z880 Allergy status to penicillin: Secondary | ICD-10-CM

## 2015-02-18 DIAGNOSIS — G9511 Acute infarction of spinal cord (embolic) (nonembolic): Secondary | ICD-10-CM

## 2015-02-18 DIAGNOSIS — R0602 Shortness of breath: Secondary | ICD-10-CM

## 2015-02-18 DIAGNOSIS — L089 Local infection of the skin and subcutaneous tissue, unspecified: Secondary | ICD-10-CM | POA: Diagnosis present

## 2015-02-18 DIAGNOSIS — R0902 Hypoxemia: Secondary | ICD-10-CM

## 2015-02-18 DIAGNOSIS — T798XXD Other early complications of trauma, subsequent encounter: Secondary | ICD-10-CM | POA: Diagnosis not present

## 2015-02-18 LAB — COMPREHENSIVE METABOLIC PANEL
ALT: 12 U/L — AB (ref 17–63)
ANION GAP: 10 (ref 5–15)
AST: 9 U/L — ABNORMAL LOW (ref 15–41)
Albumin: 2 g/dL — ABNORMAL LOW (ref 3.5–5.0)
Alkaline Phosphatase: 114 U/L (ref 38–126)
BUN: 14 mg/dL (ref 6–20)
CALCIUM: 11 mg/dL — AB (ref 8.9–10.3)
CHLORIDE: 102 mmol/L (ref 101–111)
CO2: 17 mmol/L — AB (ref 22–32)
Creatinine, Ser: 0.71 mg/dL (ref 0.61–1.24)
GFR calc non Af Amer: >60 mL/min (ref >60)
Glucose, Bld: 243 mg/dL — ABNORMAL HIGH (ref 65–99)
Potassium: 4.1 mmol/L (ref 3.5–5.1)
SODIUM: 129 mmol/L — AB (ref 135–145)
Total Bilirubin: 0.6 mg/dL (ref 0.3–1.2)
Total Protein: 7.1 g/dL (ref 6.5–8.1)

## 2015-02-18 LAB — BASIC METABOLIC PANEL
ANION GAP: 10 (ref 5–15)
BUN: 12 mg/dL (ref 6–20)
CALCIUM: 9.9 mg/dL (ref 8.9–10.3)
CHLORIDE: 102 mmol/L (ref 101–111)
CO2: 17 mmol/L — AB (ref 22–32)
Creatinine, Ser: 0.61 mg/dL (ref 0.61–1.24)
GFR calc Af Amer: >60 mL/min (ref >60)
GFR calc non Af Amer: >60 mL/min (ref >60)
Glucose, Bld: 249 mg/dL — ABNORMAL HIGH (ref 65–99)
POTASSIUM: 3.7 mmol/L (ref 3.5–5.1)
Sodium: 129 mmol/L — ABNORMAL LOW (ref 135–145)

## 2015-02-18 LAB — URINALYSIS, ROUTINE W REFLEX MICROSCOPIC
BILIRUBIN URINE: NEGATIVE
GLUCOSE, UA: NEGATIVE mg/dL
Ketones, ur: NEGATIVE mg/dL
NITRITE: POSITIVE — AB
PH: 5 (ref 5.0–8.0)
PROTEIN: 30 mg/dL — AB
SPECIFIC GRAVITY, URINE: 1.014 (ref 1.005–1.030)
UROBILINOGEN UA: 0.2 mg/dL (ref 0.0–1.0)

## 2015-02-18 LAB — URINE MICROSCOPIC-ADD ON

## 2015-02-18 LAB — PATHOLOGIST SMEAR REVIEW

## 2015-02-18 LAB — CBC WITH DIFFERENTIAL/PLATELET
BASOS ABS: 0.2 10*3/uL — AB (ref 0.0–0.1)
BASOS PCT: 1 % (ref 0–1)
EOS ABS: 0.5 10*3/uL (ref 0.0–0.7)
Eosinophils Relative: 3 % (ref 0–5)
HEMATOCRIT: 23.6 % — AB (ref 39.0–52.0)
Hemoglobin: 7.2 g/dL — ABNORMAL LOW (ref 13.0–17.0)
LYMPHS ABS: 3 10*3/uL (ref 0.7–4.0)
LYMPHS PCT: 17 % (ref 12–46)
MCH: 24.2 pg — AB (ref 26.0–34.0)
MCHC: 30.5 g/dL (ref 30.0–36.0)
MCV: 79.5 fL (ref 78.0–100.0)
Monocytes Absolute: 2.3 10*3/uL — ABNORMAL HIGH (ref 0.1–1.0)
Monocytes Relative: 13 % — ABNORMAL HIGH (ref 3–12)
NEUTROS ABS: 11.6 10*3/uL — AB (ref 1.7–7.7)
Neutrophils Relative %: 66 % (ref 43–77)
PLATELETS: 812 10*3/uL — AB (ref 150–400)
RBC: 2.97 MIL/uL — ABNORMAL LOW (ref 4.22–5.81)
RDW: 14.5 % (ref 11.5–15.5)
WBC: 17.6 10*3/uL — ABNORMAL HIGH (ref 4.0–10.5)

## 2015-02-18 LAB — BLOOD GAS, ARTERIAL
ACID-BASE DEFICIT: 8.1 mmol/L — AB (ref 0.0–2.0)
BICARBONATE: 17 meq/L — AB (ref 20.0–24.0)
Drawn by: 419771
FIO2: 0.21 %
O2 SAT: 96 %
PCO2 ART: 35.2 mmHg (ref 35.0–45.0)
PH ART: 7.306 — AB (ref 7.350–7.450)
PO2 ART: 93.9 mmHg (ref 80.0–100.0)
Patient temperature: 98.6
TCO2: 18.1 mmol/L (ref 0–100)

## 2015-02-18 LAB — MAGNESIUM: MAGNESIUM: 1.1 mg/dL — AB (ref 1.7–2.4)

## 2015-02-18 LAB — GLUCOSE, CAPILLARY
GLUCOSE-CAPILLARY: 228 mg/dL — AB (ref 65–99)
Glucose-Capillary: 227 mg/dL — ABNORMAL HIGH (ref 65–99)

## 2015-02-18 LAB — IRON AND TIBC
IRON: 10 ug/dL — AB (ref 45–182)
Saturation Ratios: 5 % — ABNORMAL LOW (ref 17.9–39.5)
TIBC: 196 ug/dL — AB (ref 250–450)
UIBC: 186 ug/dL

## 2015-02-18 LAB — PREPARE RBC (CROSSMATCH)

## 2015-02-18 LAB — FERRITIN: FERRITIN: 645 ng/mL — AB (ref 24–336)

## 2015-02-18 LAB — I-STAT CG4 LACTIC ACID, ED: Lactic Acid, Venous: 0.72 mmol/L (ref 0.5–2.0)

## 2015-02-18 LAB — PHOSPHORUS: Phosphorus: 2.6 mg/dL (ref 2.5–4.6)

## 2015-02-18 LAB — TSH: TSH: 1.272 u[IU]/mL (ref 0.350–4.500)

## 2015-02-18 MED ORDER — LIDOCAINE HCL 2 % EX GEL
1.0000 "application " | Freq: Four times a day (QID) | CUTANEOUS | Status: DC | PRN
  Filled 2015-02-18: qty 5

## 2015-02-18 MED ORDER — SODIUM CHLORIDE 0.9 % IV SOLN
INTRAVENOUS | Status: AC
  Administered 2015-02-18: 21:00:00 via INTRAVENOUS

## 2015-02-18 MED ORDER — AZTREONAM 2 G IJ SOLR: 2.0000 g | Freq: Three times a day (TID) | INTRAMUSCULAR | Status: DC

## 2015-02-18 MED ORDER — SODIUM CHLORIDE 0.9 % IV BOLUS (SEPSIS)
1000.0000 mL | Freq: Once | INTRAVENOUS | Status: AC
  Administered 2015-02-18: 1000 mL via INTRAVENOUS

## 2015-02-18 MED ORDER — HYDROMORPHONE HCL 1 MG/ML IJ SOLN
0.5000 mg | Freq: Once | INTRAMUSCULAR | Status: AC
  Administered 2015-02-18: 0.5 mg via INTRAVENOUS
  Filled 2015-02-18: qty 1

## 2015-02-18 MED ORDER — SODIUM CHLORIDE 0.9 % IV SOLN
INTRAVENOUS | Status: DC
  Filled 2015-02-18: qty 2.5

## 2015-02-18 MED ORDER — ONDANSETRON 8 MG PO TBDP
8.0000 mg | ORAL_TABLET | Freq: Three times a day (TID) | ORAL | Status: DC | PRN
  Filled 2015-02-18: qty 1

## 2015-02-18 MED ORDER — PANTOPRAZOLE SODIUM 40 MG PO TBEC
40.0000 mg | DELAYED_RELEASE_TABLET | Freq: Every day | ORAL | Status: DC
  Administered 2015-02-19 – 2015-03-06 (×17): 40 mg via ORAL
  Filled 2015-02-18 (×15): qty 1

## 2015-02-18 MED ORDER — VANCOMYCIN HCL 500 MG IV SOLR
500.0000 mg | Freq: Two times a day (BID) | INTRAVENOUS | Status: DC
  Filled 2015-02-18 (×2): qty 500

## 2015-02-18 MED ORDER — POTASSIUM CHLORIDE CRYS ER 20 MEQ PO TBCR
20.0000 meq | EXTENDED_RELEASE_TABLET | Freq: Every day | ORAL | Status: DC
  Administered 2015-02-19 – 2015-03-06 (×17): 20 meq via ORAL
  Filled 2015-02-18 (×22): qty 1

## 2015-02-18 MED ORDER — SODIUM CHLORIDE 0.9 % IV SOLN
INTRAVENOUS | Status: AC
  Administered 2015-02-18 – 2015-02-19 (×2): via INTRAVENOUS

## 2015-02-18 MED ORDER — SODIUM CHLORIDE 0.9 % IV SOLN: INTRAVENOUS | Status: DC

## 2015-02-18 MED ORDER — OXYCODONE-ACETAMINOPHEN 5-325 MG PO TABS
1.0000 | ORAL_TABLET | Freq: Three times a day (TID) | ORAL | Status: DC | PRN
  Administered 2015-02-18 – 2015-02-24 (×10): 1 via ORAL
  Filled 2015-02-18 (×11): qty 1

## 2015-02-18 MED ORDER — SALINE SPRAY 0.65 % NA SOLN
1.0000 | NASAL | Status: DC | PRN
Start: 2015-02-18 — End: 2015-03-06
  Filled 2015-02-18: qty 44

## 2015-02-18 MED ORDER — BACLOFEN 10 MG PO TABS
10.0000 mg | ORAL_TABLET | Freq: Two times a day (BID) | ORAL | Status: DC
  Administered 2015-02-19 – 2015-02-28 (×20): 10 mg via ORAL
  Filled 2015-02-18 (×22): qty 1

## 2015-02-18 MED ORDER — PREGABALIN 100 MG PO CAPS
200.0000 mg | ORAL_CAPSULE | Freq: Every day | ORAL | Status: DC
  Administered 2015-02-18 – 2015-03-05 (×14): 200 mg via ORAL
  Filled 2015-02-18 (×14): qty 2
  Filled 2015-02-18: qty 8

## 2015-02-18 MED ORDER — GLUCERNA SHAKE PO LIQD
237.0000 mL | Freq: Three times a day (TID) | ORAL | Status: DC
  Administered 2015-02-19 – 2015-03-06 (×33): 237 mL via ORAL
  Filled 2015-02-18 (×6): qty 237

## 2015-02-18 MED ORDER — SODIUM CHLORIDE 0.9 % IV SOLN
INTRAVENOUS | Status: DC
  Administered 2015-02-18: 23:00:00 via INTRAVENOUS

## 2015-02-18 MED ORDER — DIPHENHYDRAMINE HCL 25 MG PO CAPS
25.0000 mg | ORAL_CAPSULE | Freq: Once | ORAL | Status: AC
  Administered 2015-02-19: 25 mg via ORAL
  Filled 2015-02-18: qty 1

## 2015-02-18 MED ORDER — VANCOMYCIN HCL IN DEXTROSE 1-5 GM/200ML-% IV SOLN
1000.0000 mg | Freq: Once | INTRAVENOUS | Status: AC
  Administered 2015-02-18: 1000 mg via INTRAVENOUS
  Filled 2015-02-18: qty 200

## 2015-02-18 MED ORDER — LORATADINE 10 MG PO TABS
10.0000 mg | ORAL_TABLET | Freq: Every day | ORAL | Status: DC
  Administered 2015-02-19 – 2015-03-06 (×16): 10 mg via ORAL
  Filled 2015-02-18 (×16): qty 1

## 2015-02-18 MED ORDER — SODIUM CHLORIDE 0.9 % IV SOLN
Freq: Once | INTRAVENOUS | Status: AC
Start: 2015-02-18 — End: 2015-02-18
  Administered 2015-02-18: 23:00:00 via INTRAVENOUS

## 2015-02-18 MED ORDER — ENOXAPARIN SODIUM 30 MG/0.3ML ~~LOC~~ SOLN
30.0000 mg | SUBCUTANEOUS | Status: DC
  Administered 2015-02-19 (×2): 30 mg via SUBCUTANEOUS
  Filled 2015-02-18 (×3): qty 0.3

## 2015-02-18 MED ORDER — OXYCODONE HCL 5 MG PO TABS
5.0000 mg | ORAL_TABLET | Freq: Three times a day (TID) | ORAL | Status: DC | PRN
  Administered 2015-02-18 – 2015-02-24 (×10): 5 mg via ORAL
  Filled 2015-02-18 (×11): qty 1

## 2015-02-18 MED ORDER — FENTANYL 25 MCG/HR TD PT72
25.0000 ug | MEDICATED_PATCH | TRANSDERMAL | Status: DC
  Administered 2015-02-18 – 2015-02-24 (×3): 25 ug via TRANSDERMAL
  Filled 2015-02-18 (×3): qty 1

## 2015-02-18 MED ORDER — DEXTROSE 5 % IV SOLN
1.0000 g | Freq: Three times a day (TID) | INTRAVENOUS | Status: DC
  Filled 2015-02-18: qty 1

## 2015-02-18 MED ORDER — OXYCODONE-ACETAMINOPHEN 10-325 MG PO TABS: 1.0000 | ORAL_TABLET | Freq: Three times a day (TID) | ORAL | Status: DC | PRN

## 2015-02-18 MED ORDER — DEXTROSE 5 % IV SOLN
2.0000 g | Freq: Once | INTRAVENOUS | Status: DC
  Filled 2015-02-18: qty 2

## 2015-02-18 MED ORDER — POTASSIUM CHLORIDE 10 MEQ/100ML IV SOLN
10.0000 meq | INTRAVENOUS | Status: AC
  Administered 2015-02-18 (×2): 10 meq via INTRAVENOUS
  Filled 2015-02-18: qty 100

## 2015-02-18 MED ORDER — MIDODRINE HCL 5 MG PO TABS
10.0000 mg | ORAL_TABLET | Freq: Two times a day (BID) | ORAL | Status: DC
  Administered 2015-02-19: 10 mg via ORAL
  Filled 2015-02-18 (×3): qty 2

## 2015-02-18 MED ORDER — INSULIN ASPART 100 UNIT/ML ~~LOC~~ SOLN
0.0000 [IU] | Freq: Three times a day (TID) | SUBCUTANEOUS | Status: DC
  Administered 2015-02-19: 3 [IU] via SUBCUTANEOUS
  Administered 2015-02-19 – 2015-02-20 (×3): 2 [IU] via SUBCUTANEOUS
  Administered 2015-02-20: 1 [IU] via SUBCUTANEOUS
  Administered 2015-02-20 – 2015-02-22 (×3): 2 [IU] via SUBCUTANEOUS
  Administered 2015-02-22: 3 [IU] via SUBCUTANEOUS
  Administered 2015-02-22: 2 [IU] via SUBCUTANEOUS
  Administered 2015-02-23 (×2): 3 [IU] via SUBCUTANEOUS
  Administered 2015-02-23: 2 [IU] via SUBCUTANEOUS
  Administered 2015-02-24 (×2): 5 [IU] via SUBCUTANEOUS
  Administered 2015-02-24: 7 [IU] via SUBCUTANEOUS
  Administered 2015-02-25: 5 [IU] via SUBCUTANEOUS
  Administered 2015-02-25: 3 [IU] via SUBCUTANEOUS
  Administered 2015-02-25: 2 [IU] via SUBCUTANEOUS
  Administered 2015-02-26: 1 [IU] via SUBCUTANEOUS
  Administered 2015-02-26 (×2): 3 [IU] via SUBCUTANEOUS
  Administered 2015-02-27: 2 [IU] via SUBCUTANEOUS
  Administered 2015-02-28: 1 [IU] via SUBCUTANEOUS
  Administered 2015-02-28: 2 [IU] via SUBCUTANEOUS
  Administered 2015-03-01: 3 [IU] via SUBCUTANEOUS
  Administered 2015-03-01: 2 [IU] via SUBCUTANEOUS
  Administered 2015-03-01: 3 [IU] via SUBCUTANEOUS

## 2015-02-18 MED ORDER — ALBUTEROL SULFATE (2.5 MG/3ML) 0.083% IN NEBU
2.5000 mg | INHALATION_SOLUTION | RESPIRATORY_TRACT | Status: DC | PRN
  Filled 2015-02-18: qty 3

## 2015-02-18 MED ORDER — DEXTROSE-NACL 5-0.45 % IV SOLN
INTRAVENOUS | Status: DC
Start: 2015-02-18 — End: 2015-02-18

## 2015-02-18 MED ORDER — ACETAMINOPHEN 325 MG PO TABS
650.0000 mg | ORAL_TABLET | Freq: Once | ORAL | Status: AC
  Administered 2015-02-19: 650 mg via ORAL
  Filled 2015-02-18: qty 2

## 2015-02-18 MED ORDER — BACLOFEN 20 MG PO TABS
20.0000 mg | ORAL_TABLET | Freq: Every day | ORAL | Status: DC
  Administered 2015-02-19 – 2015-02-27 (×9): 20 mg via ORAL
  Filled 2015-02-18 (×12): qty 1

## 2015-02-18 NOTE — ED Provider Notes (Signed)
CSN: [id number]     Arrival date & time 02/18/15  1529 History   First MD Initiated Contact with Patient 02/18/15 1547     Chief Complaint  Patient presents with  . Wound Infection     (Consider location/radiation/quality/duration/timing/severity/associated sxs/prior Treatment) The history is provided by the patient and a parent.  Patient w hx DM, 'spinal stroke' w resultant quadriplegia, multiple chronic back, and buttock wounds, sent by his doctor for admission re concern wound infection, possible developing sepsis.  Patients family and home health care have been changing dressings. In past week, +increasing malodor of wounds and yellowish/green drainage. Pt denies pain to areas. Pt states he doesn't feel sick, no fever or chills. Normal appetite. No nvd. No gu c/o. Pt states lab work from 2 days ago w elevated wbc and that his ID doctors called and told to come for admission. Family notes low blood pressure despite continued/increased midodrine dose.        Past Medical History  Diagnosis Date  . GERD (gastroesophageal reflux disease)   . Asthma   . Hx MRSA infection     on face  . Gastroparesis   . Diabetic neuropathy   . Seizures   . Family history of anesthesia complication     Pt mother can't have epidural procedures  . Dysrhythmia   . Pneumonia   . Arthritis   . Fibromyalgia   . 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 Type I diabetes mellitus     sees Dr. Loanne Drilling   . Syncope 02/16/2015   Past Surgical History  Procedure Laterality Date  . Tonsillectomy    . Multiple extractions with alveoloplasty N/A 08/03/2014    Procedure: MULTIPLE EXTRACTIONS;  Surgeon: Gae Bon, DDS;  Location: [place];  Service: Oral Surgery;  Laterality: N/A;  . Tee without cardioversion N/A 08/17/2014    Procedure: TRANSESOPHAGEAL ECHOCARDIOGRAM (TEE);  Surgeon: Dorothy Spark, MD;  Location[facility] ENDOSCOPY;  Service: Cardiovascular;  Laterality: N/A;  . Debridment  of decubitus ulcer N/A 10/04/2014    Procedure: DEBRIDMENT OF DECUBITUS ULCER;  Surgeon: Georganna Skeans, MD;  Location: [place];  Service: General;  Laterality: N/A;  . Laparoscopic diverted colostomy N/A 10/12/2014    Procedure: LAPAROSCOPIC DIVERTING COLOSTOMY;  Surgeon: Donnie Mesa, MD;  Location: [place];  Service: General;  Laterality: N/A;  . Insertion of suprapubic catheter N/A 10/12/2014    Procedure: INSERTION OF SUPRAPUBIC CATHETER;  Surgeon: Reece Packer, MD;  Location: [place];  Service: Urology;  Laterality: N/A;  . Incision and drainage of wound N/A 11/12/2014    Procedure: IRRIGATION AND DEBRIDEMENT OF WOUNDS WITH BONE BIOPSY AND SURGICAL PREP ;  Surgeon: Theodoro Kos, DO;  Location: [place];  Service: Plastics;  Laterality: N/A;  . Application of a-cell of back N/A 11/12/2014    Procedure: APPLICATION A CELL AND VAC ;  Surgeon: Theodoro Kos, DO;  Location: [place];  Service: Plastics;  Laterality: N/A;  . Incision and drainage of wound N/A 11/18/2014    Procedure: IRRIGATION AND DEBRIDEMENT OF SACRAL ULCER ONLY WITH PLACEMENT OF A CELL AND VAC/ DRESSING CHANGE TO UPPER BACK AREA.;  Surgeon: Theodoro Kos, DO;  Location: [place];  Service: Plastics;  Laterality: N/A;  . Incision and drainage of wound N/A 11/25/2014    Procedure: IRRIGATION AND DEBRIDEMENT OF SACRAL ULCER AND BACK BURN WITH PLACEMENT OF A-CELL;  Surgeon: Theodoro Kos, DO;  Location: [place];  Service: Plastics;  Laterality: N/A;  . Minor application of wound vac N/A 11/25/2014    Procedure:  WOUND VAC CHANGE;  Surgeon: Theodoro Kos, DO;  Location: [place];  Service: Plastics;  Laterality: N/A;  . Incision and drainage of wound Right 12/08/2014    Procedure: IRRIGATION AND DEBRIDEMENT SACRAL WOUND AND RIGHT ISCHIAL WOUND ;  Surgeon: Theodoro Kos, DO;  Location: [place];  Service: Plastics;  Laterality: Right;  . Application of a-cell of back N/A 12/08/2014    Procedure: APPLICATION OF A-CELL AND WOUND VAC ;  Surgeon: Theodoro Kos, DO;   Location: [place];  Service: Plastics;  Laterality: N/A;   Family History  Problem Relation Age of Onset  . Diabetes Father   . Hypertension Father   . Asthma      fhx  . Hypertension      fhx  . Stroke      fhx  . Heart disease Mother    History  Substance Use Topics  . Smoking status: Former Smoker -- 12 years    Types: Cigars, Cigarettes    Quit date: 09/07/2014  . Smokeless tobacco: Never Used  . Alcohol Use: No    Review of Systems  Constitutional: Negative for fever and chills.  HENT: Negative for sore throat.   Eyes: Negative for redness.  Respiratory: Negative for shortness of breath.   Cardiovascular: Negative for chest pain.  Gastrointestinal: Negative for vomiting, abdominal pain and diarrhea.  Endocrine: Negative for polyuria.  Genitourinary: Negative for dysuria and flank pain.  Musculoskeletal: Negative for back pain and neck pain.  Skin: Positive for wound. Negative for rash.  Neurological: Negative for headaches.  Hematological: Does not bruise/bleed easily.  Psychiatric/Behavioral: Negative for confusion.      Allergies  Cefuroxime axetil; Ertapenem; Morphine and related; Penicillins; Sulfa antibiotics; Tessalon; and Shellfish allergy  Home Medications   Prior to Admission medications   Medication Sig Start Date End Date Taking? Authorizing Provider  acetaminophen (TYLENOL) 325 MG tablet Take 2 tablets (650 mg total) by mouth every 6 (six) hours as needed for mild pain, moderate pain, fever or headache. 01/14/15   Geradine Girt, DO  albuterol (PROVENTIL) (2.5 MG/3ML) 0.083% nebulizer solution Take 3 mLs (2.5 mg total) by nebulization every 4 (four) hours as needed for wheezing or shortness of breath. 04/27/14   [name], MD  baclofen (LIORESAL) 10 MG tablet Take 1-2 tablets (10-20 mg total) by mouth 3 (three) times daily. Takes 10mg  in morning and lunchtime and takes 20mg  at bedtime 11/29/14   Meredith Staggers, MD  collagenase (SANTYL) ointment  Apply 1 application topically daily. 08/30/14   [name], MD  dexlansoprazole (DEXILANT) 60 MG capsule Take 1 capsule (60 mg total) by mouth daily. 11/19/14   Shanker Kristeen Mans, MD  diclofenac sodium (VOLTAREN) 1 % GEL Apply 2 g topically daily as needed (for pain). 11/19/14   Shanker Kristeen Mans, MD  feeding supplement, GLUCERNA SHAKE, (GLUCERNA SHAKE) LIQD Take 237 mLs by mouth 3 (three) times daily between meals. 12/10/14   Florencia Reasons, MD  fentaNYL (DURAGESIC - DOSED MCG/HR) 25 MCG/HR patch Place 1 patch (25 mcg total) onto the skin every 3 (three) days. 01/24/15   Meredith Staggers, MD  fluconazole (DIFLUCAN) 200 MG tablet Take 1 tablet (200 mg total) by mouth daily. 01/14/15   Geradine Girt, DO  glucagon (GLUCAGON EMERGENCY) 1 MG injection Inject 1 mg into the vein once as needed. 04/20/14   [name],  MD  glucose blood (ACCU-CHEK SMARTVIEW) test strip Use to check blood sugar 4 times daily. Patient not taking: Reported on 02/16/2015 01/20/15   Renato Shin, MD  insulin aspart (NOVOLOG) 100 UNIT/ML injection Inject 5 Units into the skin 3 (three) times daily with meals. 01/14/15   Geradine Girt, DO  insulin glargine (LANTUS) 100 UNIT/ML injection Inject 0.15 mLs (15 Units total) into the skin at bedtime. 01/14/15   Geradine Girt, DO  lidocaine (XYLOCAINE) 2 % jelly Apply 1 application topically 4 (four) times daily as needed (pain).  01/24/15   Historical Provider, MD  loratadine (CLARITIN) 10 MG tablet Take 10 mg by mouth daily.     Historical Provider, MD  midodrine (PROAMATINE) 5 MG tablet Take 2 tablets (10 mg total) by mouth 2 (two) times daily with a meal. 01/26/15   [name], MD  ondansetron (ZOFRAN ODT) 8 MG disintegrating tablet Take 1 tablet (8 mg total) by mouth every 8 (eight) hours as needed for nausea or vomiting. 12/29/14   [name], MD  oxyCODONE-acetaminophen (PERCOCET) 10-325 MG per tablet Take 1 tablet by mouth every 8 (eight) hours. Patient taking differently: Take 1 tablet by  mouth every 8 (eight) hours as needed for pain.  01/24/15   Meredith Staggers, MD  potassium chloride SA (KLOR-CON M20) 20 MEQ tablet Take 1 tablet (20 mEq total) by mouth daily. 10/25/14   [name], MD  pregabalin (LYRICA) 100 MG capsule Take 1-2 capsules (100-200 mg total) by mouth 4 (four) times daily. Take 100 mg by mouth in the morning, take 100 mg by mouth in the afternoon, take 100 mg by mouth in the evening and take 200 mg by mouth at bedtime. Patient taking differently: Take 100 mg by mouth 3 (three) times daily.  11/29/14   Meredith Staggers, MD  pregabalin (LYRICA) 200 MG capsule Take 1 capsule (200 mg total) by mouth at bedtime. 12/10/14   Florencia Reasons, MD  QUEtiapine (SEROQUEL) 25 MG tablet Take 1 tablet (25 mg total) by mouth 3 (three) times daily. 01/14/15   Geradine Girt, DO  sodium chloride (OCEAN) 0.65 % SOLN nasal spray Place 1 spray into both nostrils as needed for congestion.    Historical Provider, MD  tigecycline 50 mg in sodium chloride 0.9 % 100 mL Inject 50 mg into the vein every 12 (twelve) hours. 01/14/15   Jessica U Vann, DO   BP 100/68 mmHg  Pulse 77  Temp(Src) 99.8 F (37.7 C) (Oral)  Resp 16  SpO2 99% Physical Exam  Constitutional: He is oriented to person, place, and time. He appears well-developed and well-nourished. No distress.  HENT:  Mouth/Throat: Oropharynx is clear and moist.  Eyes: Conjunctivae are normal. Pupils are equal, round, and reactive to light. No scleral icterus.  Neck: Neck supple. No tracheal deviation present.  No stiffness or rigidity  Cardiovascular: Normal rate, regular rhythm, normal heart sounds and intact distal pulses.   Pulmonary/Chest: Effort normal and breath sounds normal. No accessory muscle usage. No respiratory distress.  Abdominal: Soft. Bowel sounds are normal. He exhibits no distension and no mass. There is no tenderness. There is no rebound and no guarding.  Genitourinary:  No cva tenderness.   Musculoskeletal: Normal range of  motion. He exhibits no edema.  Neurological: He is alert and oriented to person, place, and time.  Skin: Skin is warm and dry. He is not diaphoretic.  Multiple deep wounds to right buttock and  perineal region. Large pressure wound to mid back.  Wounds w moderate, malodorous, yellowish-green drainage. No surrounding cellulitis. No fluctuance/abscess noted. No crepitus. No grossly necrotic tissue.   Psychiatric: He has a normal mood and affect.  Nursing note and vitals reviewed.   ED Course  Procedures (including critical care time) Labs Review   Results for orders placed or performed during the hospital encounter of 02/18/15  CBC with Differential/Platelet  Result Value Ref Range   WBC 17.6 (H) 4.0 - 10.5 K/uL   RBC 2.97 (L) 4.22 - 5.81 MIL/uL   Hemoglobin 7.2 (L) 13.0 - 17.0 g/dL   HCT 23.6 (L) 39.0 - 52.0 %   MCV 79.5 78.0 - 100.0 fL   MCH 24.2 (L) 26.0 - 34.0 pg   MCHC 30.5 30.0 - 36.0 g/dL   RDW 14.5 11.5 - 15.5 %   Platelets 812 (H) 150 - 400 K/uL   Neutrophils Relative % 66 43 - 77 %   Lymphocytes Relative 17 12 - 46 %   Monocytes Relative 13 (H) 3 - 12 %   Eosinophils Relative 3 0 - 5 %   Basophils Relative 1 0 - 1 %   Neutro Abs 11.6 (H) 1.7 - 7.7 K/uL   Lymphs Abs 3.0 0.7 - 4.0 K/uL   Monocytes Absolute 2.3 (H) 0.1 - 1.0 K/uL   Eosinophils Absolute 0.5 0.0 - 0.7 K/uL   Basophils Absolute 0.2 (H) 0.0 - 0.1 K/uL   Smear Review MORPHOLOGY UNREMARKABLE   Comprehensive metabolic panel  Result Value Ref Range   Sodium 129 (L) 135 - 145 mmol/L   Potassium 4.1 3.5 - 5.1 mmol/L   Chloride 102 101 - 111 mmol/L   CO2 17 (L) 22 - 32 mmol/L   Glucose, Bld 243 (H) 65 - 99 mg/dL   BUN 14 6 - 20 mg/dL   Creatinine, Ser 0.71 0.61 - 1.24 mg/dL   Calcium 11.0 (H) 8.9 - 10.3 mg/dL   Total Protein 7.1 6.5 - 8.1 g/dL   Albumin 2.0 (L) 3.5 - 5.0 g/dL   AST 9 (L) 15 - 41 U/L   ALT 12 (L) 17 - 63 U/L   Alkaline Phosphatase 114 38 - 126 U/L   Total Bilirubin 0.6 0.3 - 1.2 mg/dL    GFR calc non Af Amer >60 >60 mL/min   GFR calc Af Amer >60 >60 mL/min   Anion gap 10 5 - 15  I-Stat CG4 Lactic Acid, ED  Result Value Ref Range   Lactic Acid, Venous 0.72 0.5 - 2.0 mmol/L  Type and screen  Result Value Ref Range   ABO/RH(D) O POS    Antibody Screen NEG    Sample Expiration 02/21/2015       MDM   Iv ns bolus. Continuous pulse ox and monitor. Labs. Cultures.  Reviewed nursing notes and prior charts for additional history.   Wound care, and wound care consult.  Patient w dm and multiple infected pressure sores/deep wounds - medical service contacted for admission.  Multiple allergies noted.  Aztreonam and vanc iv.  Recheck pt, no change in condition.  Family reports occasional bleeding from decub wound, no current bleeding, hgb mildly lower than prior, hx chronic anemia and prior transfusion.  May need prbc during hospital stay, monitor labs/vitals.  Hospitalists paged for admission.     Lajean Saver, MD 02/18/15 [phone]

## 2015-02-18 NOTE — Telephone Encounter (Signed)
The patient mother called to get the results of his recent labs. Advised her per Dr Tommy Medal note to take him to the hospital asap. She advised the patient is not capable of checking voice mail and that we should call her phone when trying to get him. She also advised she will get patient to hospital asap.

## 2015-02-18 NOTE — H&P (Signed)
Jason [name] is an 29 y.o. male.    Dr. Delma Freeze (pcp) Dr. Loanne Drilling (endocrine) Dr. 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 Tommy Medal and labs showed leukocytosis.  Pt was sent to ER for evaluation . Pt has been afebrile,  Notes that he has a lot of bloody drainage and this is probably why he is more anemic per his mother.  The discharge from skin ulcer is yellow green per his mother, and smells.  Pt will be admitted for sacral decubitus infection, and anemia, and mild dka.   Past Medical History  Diagnosis Date  . GERD (gastroesophageal reflux disease)   . Asthma   . Hx MRSA infection     on face  . Gastroparesis   . Diabetic neuropathy   . Seizures   . Family history of anesthesia complication     Pt mother can't have epidural procedures  . Dysrhythmia   . Pneumonia   . Arthritis   . Fibromyalgia   . 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 Type I diabetes mellitus     sees Dr. Loanne Drilling   . Syncope 02/16/2015    Past Surgical History  Procedure Laterality Date  . Tonsillectomy    . Multiple extractions with alveoloplasty N/A 08/03/2014    Procedure: MULTIPLE EXTRACTIONS;  Surgeon: Gae Bon, DDS;  Location: [place];  Service: Oral Surgery;  Laterality: N/A;  . Tee without cardioversion N/A 08/17/2014    Procedure: TRANSESOPHAGEAL ECHOCARDIOGRAM (TEE);  Surgeon: Dorothy Spark, MD;  Location: [facility] ENDOSCOPY;  Service: Cardiovascular;  Laterality: N/A;  . Debridment of decubitus ulcer N/A 10/04/2014    Procedure: DEBRIDMENT OF DECUBITUS ULCER;  Surgeon: Georganna Skeans, MD;  Location: [place];  Service: General;  Laterality: N/A;  . Laparoscopic diverted colostomy N/A 10/12/2014    Procedure: LAPAROSCOPIC DIVERTING COLOSTOMY;  Surgeon: Donnie Mesa, MD;  Location: [place];  Service: General;  Laterality: N/A;  . Insertion of suprapubic catheter N/A  10/12/2014    Procedure: INSERTION OF SUPRAPUBIC CATHETER;  Surgeon: Reece Packer, MD;  Location: [place];  Service: Urology;  Laterality: N/A;  . Incision and drainage of wound N/A 11/12/2014    Procedure: IRRIGATION AND DEBRIDEMENT OF WOUNDS WITH BONE BIOPSY AND SURGICAL PREP ;  Surgeon: Theodoro Kos, DO;  Location: [place];  Service: Plastics;  Laterality: N/A;  . Application of a-cell of back N/A 11/12/2014    Procedure: APPLICATION A CELL AND VAC ;  Surgeon: Theodoro Kos, DO;  Location: [place];  Service: Plastics;  Laterality: N/A;  . Incision and drainage of wound N/A 11/18/2014    Procedure: IRRIGATION AND DEBRIDEMENT OF SACRAL ULCER ONLY WITH PLACEMENT OF A CELL AND VAC/ DRESSING CHANGE TO UPPER BACK AREA.;  Surgeon: Theodoro Kos, DO;  Location: [place];  Service: Plastics;  Laterality: N/A;  . Incision and drainage of wound N/A 11/25/2014    Procedure: IRRIGATION AND DEBRIDEMENT OF SACRAL ULCER AND BACK BURN WITH PLACEMENT OF A-CELL;  Surgeon: Theodoro Kos, DO;  Location: [place];  Service: Plastics;  Laterality: N/A;  . Minor application of wound vac N/A 11/25/2014    Procedure:  WOUND VAC CHANGE;  Surgeon: Theodoro Kos, DO;  Location: [place];  Service: Plastics;  Laterality: N/A;  . Incision and drainage of wound Right 12/08/2014    Procedure: IRRIGATION  AND DEBRIDEMENT SACRAL WOUND AND RIGHT ISCHIAL WOUND ;  Surgeon: Theodoro Kos, DO;  Location: [place];  Service: Plastics;  Laterality: Right;  . Application of a-cell of back N/A 12/08/2014    Procedure: APPLICATION OF A-CELL AND WOUND VAC ;  Surgeon: Theodoro Kos, DO;  Location: [place];  Service: Plastics;  Laterality: N/A;    Family History  Problem Relation Age of Onset  . Diabetes Father   . Hypertension Father   . Asthma      fhx  . Hypertension      fhx  . Stroke      fhx  . Heart disease Mother    Social History:  reports that he quit smoking about 5 months ago. His smoking use included Cigars and Cigarettes. He has a 12 pack-year  smoking history. He has never used smokeless tobacco. He reports that he does not drink alcohol or use illicit drugs.  Allergies:  Allergies  Allergen Reactions  . Cefuroxime Axetil Anaphylaxis  . Ertapenem Other (See Comments)    Rash and confusion  . Morphine And Related Other (See Comments)    Changed mental status, confusion, headache, visual hallucination  . Penicillins Anaphylaxis and Other (See Comments)    ?can take amoxicillin?  . Sulfa Antibiotics Anaphylaxis, Shortness Of Breath and Other (See Comments)  . Tessalon [Benzonatate] Anaphylaxis  . Shellfish Allergy Itching and Other (See Comments)    Took benadryl to alleviate reaction   Medications reviewed   (Not in a hospital admission)  Results for orders placed or performed during the hospital encounter of 02/18/15 (from the past 48 hour(s))  CBC with Differential/Platelet     Status: Abnormal   Collection Time: 02/18/15  4:15 PM  Result Value Ref Range   WBC 17.6 (H) 4.0 - 10.5 K/uL   RBC 2.97 (L) 4.22 - 5.81 MIL/uL   Hemoglobin 7.2 (L) 13.0 - 17.0 g/dL   HCT 23.6 (L) 39.0 - 52.0 %   MCV 79.5 78.0 - 100.0 fL   MCH 24.2 (L) 26.0 - 34.0 pg   MCHC 30.5 30.0 - 36.0 g/dL   RDW 14.5 11.5 - 15.5 %   Platelets 812 (H) 150 - 400 K/uL   Neutrophils Relative % 66 43 - 77 %   Lymphocytes Relative 17 12 - 46 %   Monocytes Relative 13 (H) 3 - 12 %   Eosinophils Relative 3 0 - 5 %   Basophils Relative 1 0 - 1 %   Neutro Abs 11.6 (H) 1.7 - 7.7 K/uL   Lymphs Abs 3.0 0.7 - 4.0 K/uL   Monocytes Absolute 2.3 (H) 0.1 - 1.0 K/uL   Eosinophils Absolute 0.5 0.0 - 0.7 K/uL   Basophils Absolute 0.2 (H) 0.0 - 0.1 K/uL   Smear Review MORPHOLOGY UNREMARKABLE   Comprehensive metabolic panel     Status: Abnormal   Collection Time: 02/18/15  4:15 PM  Result Value Ref Range   Sodium 129 (L) 135 - 145 mmol/L   Potassium 4.1 3.5 - 5.1 mmol/L   Chloride 102 101 - 111 mmol/L   CO2 17 (L) 22 - 32 mmol/L   Glucose, Bld 243 (H) 65 - 99  mg/dL   BUN 14 6 - 20 mg/dL   Creatinine, Ser 0.71 0.61 - 1.24 mg/dL   Calcium 11.0 (H) 8.9 - 10.3 mg/dL   Total Protein 7.1 6.5 - 8.1 g/dL   Albumin 2.0 (L) 3.5 - 5.0 g/dL   AST 9 (L) 15 -  41 U/L   ALT 12 (L) 17 - 63 U/L   Alkaline Phosphatase 114 38 - 126 U/L   Total Bilirubin 0.6 0.3 - 1.2 mg/dL   GFR calc non Af Amer >60 >60 mL/min   GFR calc Af Amer >60 >60 mL/min    Comment: (NOTE) The eGFR has been calculated using the CKD EPI equation. This calculation has not been validated in all clinical situations. eGFR's persistently <60 mL/min signify possible Chronic Kidney Disease.    Anion gap 10 5 - 15  Type and screen     Status: None   Collection Time: 02/18/15  4:15 PM  Result Value Ref Range   ABO/RH(D) O POS    Antibody Screen NEG    Sample Expiration 02/21/2015   I-Stat CG4 Lactic Acid, ED     Status: None   Collection Time: 02/18/15  4:31 PM  Result Value Ref Range   Lactic Acid, Venous 0.72 0.5 - 2.0 mmol/L   No results found.  Review of Systems  Constitutional: Positive for weight loss. Negative for fever, chills, malaise/fatigue and diaphoresis.  HENT: Negative for congestion, ear discharge, ear pain, hearing loss, nosebleeds, sore throat and tinnitus.   Eyes: Negative.   Respiratory: Negative for cough, hemoptysis, sputum production, shortness of breath, wheezing and stridor.   Cardiovascular: Negative for chest pain, palpitations, orthopnea, claudication, leg swelling and PND.  Gastrointestinal: Positive for abdominal pain. Negative for heartburn, nausea, vomiting, diarrhea, constipation, blood in stool and melena.  Genitourinary: Negative for dysuria, urgency, frequency, hematuria and flank pain.  Musculoskeletal: Negative for myalgias, back pain, joint pain, falls and neck pain.  Skin: Negative for itching and rash.       + decubitus ulcer  Neurological: Negative for dizziness, tingling, tremors, sensory change, speech change, focal weakness, seizures, loss  of consciousness, weakness and headaches.  Endo/Heme/Allergies: Negative for environmental allergies and polydipsia. Does not bruise/bleed easily.  Psychiatric/Behavioral: Negative for depression, suicidal ideas, hallucinations, memory loss and substance abuse. The patient is not nervous/anxious and does not have insomnia.     Blood pressure 109/69, pulse 71, temperature 99.8 F (37.7 C), temperature source Oral, resp. rate 16, SpO2 100 %. Physical Exam  Constitutional: He is oriented to person, place, and time. He appears well-developed and well-nourished.  HENT:  Head: Normocephalic and atraumatic.  Mouth/Throat: No oropharyngeal exudate.  Eyes: Conjunctivae and EOM are normal. Pupils are equal, round, and reactive to light. No scleral icterus.  Neck: Normal range of motion. Neck supple. No JVD present. No tracheal deviation present. No thyromegaly present.  Cardiovascular: Normal rate and regular rhythm.  Exam reveals no gallop and no friction rub.   No murmur heard. Respiratory: Effort normal and breath sounds normal. No respiratory distress. He has no wheezes. He has no rales.  GI: Soft. Bowel sounds are normal. He exhibits no distension and no mass. There is no tenderness. There is no rebound and no guarding.  Musculoskeletal: Normal range of motion. He exhibits no edema or tenderness.  Lymphadenopathy:    He has no cervical adenopathy.  Neurological: He is alert and oriented to person, place, and time. He displays abnormal reflex. He exhibits abnormal muscle tone.  + quadriplegia  Skin: Skin is warm. No rash noted. No erythema. No pallor.  Psychiatric: He has a normal mood and affect. His behavior is normal. Judgment and thought content normal.     Assessment/Plan Sacral decubitus infection vanco iv pharmacy to dose, levaquin iv  Pharmacy to dose Blood  culture x2, wound culture  Leukocytosis Check MRI L spine r/o osteomyelitis  Anemia Check spep,  upep  Hypercalcemia Check pth, mag, phos, pth rp, spep, upep, vitamijn d, tsh  Thrombocytosis Likely secondary to iron def Check iron , tibc, ferritin  Mild dka Ns iv Iv insulin dka protocol  DVT prophylaxis: scd, lovenox  Jani Gravel 02/18/2015, 7:42 PM

## 2015-02-18 NOTE — Progress Notes (Signed)
Attempted to get report. RN will call me back.

## 2015-02-18 NOTE — Progress Notes (Addendum)
ANTIBIOTIC CONSULT NOTE - INITIAL  Pharmacy Consult for vancomycin + aztreonam Indication: wound infxn  Allergies  Allergen Reactions  . Cefuroxime Axetil Anaphylaxis  . Ertapenem Other (See Comments)    Rash and confusion  . Morphine And Related Other (See Comments)    Changed mental status, confusion, headache, visual hallucination  . Penicillins Anaphylaxis and Other (See Comments)    ?can take amoxicillin?  . Sulfa Antibiotics Anaphylaxis, Shortness Of Breath and Other (See Comments)  . Tessalon [Benzonatate] Anaphylaxis  . Shellfish Allergy Itching and Other (See Comments)    Took benadryl to alleviate reaction    Patient Measurements:   Adjusted Body Weight:   Vital Signs: Temp: 99.8 F (37.7 C) (05/13 1537) Temp Source: Oral (05/13 1537) BP: 112/76 mmHg (05/13 1745) Pulse Rate: 71 (05/13 1745) Intake/Output from previous day:   Intake/Output from this shift:    Labs:  Recent Labs  02/16/15 1545 02/18/15 1615  WBC 20.7* 17.6*  HGB 8.0* 7.2*  PLT 951* 812*  CREATININE 0.77 0.71   Estimated Creatinine Clearance: 97 mL/min (by C-G formula based on Cr of 0.71). No results for input(s): VANCOTROUGH, VANCOPEAK, VANCORANDOM, GENTTROUGH, GENTPEAK, GENTRANDOM, TOBRATROUGH, TOBRAPEAK, TOBRARND, AMIKACINPEAK, AMIKACINTROU, AMIKACIN in the last 72 hours.   Microbiology: Recent Results (from the past 720 hour(s))  Urine culture     Status: None   Collection Time: 01/31/15  3:26 PM  Result Value Ref Range Status   Specimen Description URINE, CLEAN CATCH  Final   Special Requests NONE  Final   Colony Count   Final    >=100,000 COLONIES/ML Performed at [facility]    Culture   Final    KLEBSIELLA PNEUMONIAE Performed at [facility]    Report Status 02/03/2015 FINAL  Final   Organism ID, Bacteria KLEBSIELLA PNEUMONIAE  Final      Susceptibility   Klebsiella pneumoniae - MIC*    AMPICILLIN RESISTANT      CEFAZOLIN <=4 SENSITIVE Sensitive    CEFTRIAXONE <=1 SENSITIVE Sensitive     CIPROFLOXACIN <=0.25 SENSITIVE Sensitive     GENTAMICIN <=1 SENSITIVE Sensitive     LEVOFLOXACIN <=0.12 SENSITIVE Sensitive     NITROFURANTOIN <=16 SENSITIVE Sensitive     TOBRAMYCIN <=1 SENSITIVE Sensitive     TRIMETH/SULFA <=20 SENSITIVE Sensitive     PIP/TAZO <=4 SENSITIVE Sensitive     * KLEBSIELLA PNEUMONIAE    Medical History: Past Medical History  Diagnosis Date  . GERD (gastroesophageal reflux disease)   . Asthma   . Hx MRSA infection     on face  . Gastroparesis   . Diabetic neuropathy   . Seizures   . Family history of anesthesia complication     Pt mother can't have epidural procedures  . Dysrhythmia   . Pneumonia   . Arthritis   . Fibromyalgia   . 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 Type I diabetes mellitus     sees Dr. Loanne Drilling   . Syncope 02/16/2015    Medications:  Anti-infectives    Start     Dose/Rate Route Frequency Ordered Stop   02/19/15 0800  vancomycin (VANCOCIN) 500 mg in sodium chloride 0.9 % 100 mL IVPB     500 mg 100 mL/hr over 60 Minutes Intravenous Every 12 hours 02/18/15 1804     02/19/15 0400  aztreonam (AZACTAM) 1 g in dextrose 5 % 50 mL IVPB     1 g 100 mL/hr over 30  Minutes Intravenous Every 8 hours 02/18/15 1804     02/18/15 2200  aztreonam (AZACTAM) 2 g in dextrose 5 % 50 mL IVPB  Status:  Discontinued     2 g 100 mL/hr over 30 Minutes Intravenous 3 times per day 02/18/15 1745 02/18/15 1745   02/18/15 1800  vancomycin (VANCOCIN) IVPB 1000 mg/200 mL premix     1,000 mg 200 mL/hr over 60 Minutes Intravenous  Once 02/18/15 1745     02/18/15 1800  aztreonam (AZACTAM) 2 g in dextrose 5 % 50 mL IVPB     2 g 100 mL/hr over 30 Minutes Intravenous  Once 02/18/15 1746       Assessment: 71 yom presented to the ED with a wound infxn. To start empiric vancomycin + aztreonam for antimicrobial coverage. Pt with Tmax of 99.8 and WBC is elevated at 17.6. Scr is WNL but CrCl may be  overestimated d/t quadriplegia.   Vanc 5/13>> Aztreo 5/13>>  Goal of Therapy:  Vancomycin trough level 15-20 mcg/ml  Plan:  - Aztreonam 2gm IV x 1 then 1gm IV Q8H - Vanc 1gm IV x 1 then 500mg  IV Q12H - F/u renal fxn, C&S, clinical status and trough at [facility], Rande Lawman 02/18/2015,6:04 PM   *   *   *   *   *  ADDENDUM:   Patient on Vancomycin and Aztreonam for a sacral decubitus infection.  Aztreonam to be transitioned to Levaquin per Pharmacy Consult.  PLAN:  1.  Discontinue Aztreonam. 2.  Levaquin 750 mg IV q 24 hours.   Marthenia Rolling,  Pharm.D   02/18/2015,  8:40 PM

## 2015-02-18 NOTE — Progress Notes (Signed)
Pt refusing DKA protocol, borderline DKA even though AG nl, bicarb low and Ph low, we will proceed with hydration and also ISS, since pt will not let us use DKA protocol

## 2015-02-18 NOTE — ED Notes (Signed)
Pt arrives via ptar with hx of quadriplegia, states his doctor wanted him to be a direct admit here for an infection of a wound on his back. Pt is wheelchair bound. Reports low grade fever yesterday, nad.

## 2015-02-19 ENCOUNTER — Inpatient Hospital Stay (HOSPITAL_COMMUNITY): Payer: Medicaid Other

## 2015-02-19 DIAGNOSIS — G8250 Quadriplegia, unspecified: Secondary | ICD-10-CM

## 2015-02-19 DIAGNOSIS — Z8614 Personal history of Methicillin resistant Staphylococcus aureus infection: Secondary | ICD-10-CM

## 2015-02-19 DIAGNOSIS — B9689 Other specified bacterial agents as the cause of diseases classified elsewhere: Secondary | ICD-10-CM

## 2015-02-19 DIAGNOSIS — E43 Unspecified severe protein-calorie malnutrition: Secondary | ICD-10-CM

## 2015-02-19 DIAGNOSIS — Z8619 Personal history of other infectious and parasitic diseases: Secondary | ICD-10-CM

## 2015-02-19 DIAGNOSIS — Z959 Presence of cardiac and vascular implant and graft, unspecified: Secondary | ICD-10-CM

## 2015-02-19 DIAGNOSIS — Z794 Long term (current) use of insulin: Secondary | ICD-10-CM

## 2015-02-19 DIAGNOSIS — L89159 Pressure ulcer of sacral region, unspecified stage: Secondary | ICD-10-CM

## 2015-02-19 DIAGNOSIS — E1010 Type 1 diabetes mellitus with ketoacidosis without coma: Secondary | ICD-10-CM

## 2015-02-19 DIAGNOSIS — Z960 Presence of urogenital implants: Secondary | ICD-10-CM

## 2015-02-19 DIAGNOSIS — Z933 Colostomy status: Secondary | ICD-10-CM

## 2015-02-19 DIAGNOSIS — E871 Hypo-osmolality and hyponatremia: Secondary | ICD-10-CM

## 2015-02-19 DIAGNOSIS — R509 Fever, unspecified: Secondary | ICD-10-CM

## 2015-02-19 LAB — BASIC METABOLIC PANEL
ANION GAP: 8 (ref 5–15)
Anion gap: 7 (ref 5–15)
BUN: 11 mg/dL (ref 6–20)
BUN: 9 mg/dL (ref 6–20)
CALCIUM: 9.6 mg/dL (ref 8.9–10.3)
CHLORIDE: 105 mmol/L (ref 101–111)
CO2: 16 mmol/L — AB (ref 22–32)
CO2: 16 mmol/L — AB (ref 22–32)
CREATININE: 0.52 mg/dL — AB (ref 0.61–1.24)
Calcium: 9.8 mg/dL (ref 8.9–10.3)
Chloride: 108 mmol/L (ref 101–111)
Creatinine, Ser: 0.51 mg/dL — ABNORMAL LOW (ref 0.61–1.24)
GFR calc Af Amer: >60 mL/min (ref >60)
GFR calc non Af Amer: >60 mL/min (ref >60)
GLUCOSE: 180 mg/dL — AB (ref 65–99)
GLUCOSE: 222 mg/dL — AB (ref 65–99)
POTASSIUM: 3.9 mmol/L (ref 3.5–5.1)
Potassium: 4.2 mmol/L (ref 3.5–5.1)
SODIUM: 131 mmol/L — AB (ref 135–145)
SODIUM: 129 mmol/L — AB (ref 135–145)

## 2015-02-19 LAB — CBC
HCT: 30.9 % — ABNORMAL LOW (ref 39.0–52.0)
Hemoglobin: 10 g/dL — ABNORMAL LOW (ref 13.0–17.0)
MCH: 26.2 pg (ref 26.0–34.0)
MCHC: 32.4 g/dL (ref 30.0–36.0)
MCV: 80.9 fL (ref 78.0–100.0)
PLATELETS: 779 10*3/uL — AB (ref 150–400)
RBC: 3.82 MIL/uL — ABNORMAL LOW (ref 4.22–5.81)
RDW: 14.4 % (ref 11.5–15.5)
WBC: 19 10*3/uL — AB (ref 4.0–10.5)

## 2015-02-19 LAB — GLUCOSE, CAPILLARY
Glucose-Capillary: 216 mg/dL — ABNORMAL HIGH (ref 65–99)
Glucose-Capillary: 215 mg/dL — ABNORMAL HIGH (ref 65–99)
Glucose-Capillary: 232 mg/dL — ABNORMAL HIGH (ref 65–99)
Glucose-Capillary: 173 mg/dL — ABNORMAL HIGH (ref 65–99)
Glucose-Capillary: 186 mg/dL — ABNORMAL HIGH (ref 65–99)
Glucose-Capillary: 202 mg/dL — ABNORMAL HIGH (ref 65–99)

## 2015-02-19 LAB — MRSA PCR SCREENING: MRSA by PCR: NEGATIVE

## 2015-02-19 MED ORDER — INSULIN GLARGINE 100 UNIT/ML ~~LOC~~ SOLN
25.0000 [IU] | Freq: Every day | SUBCUTANEOUS | Status: DC
  Administered 2015-02-19: 25 [IU] via SUBCUTANEOUS
  Filled 2015-02-19 (×2): qty 0.25

## 2015-02-19 MED ORDER — SODIUM CHLORIDE 0.9 % IJ SOLN
10.0000 mL | INTRAMUSCULAR | Status: DC | PRN
  Administered 2015-02-25 – 2015-03-06 (×8): 10 mL
  Filled 2015-02-19 (×8): qty 40

## 2015-02-19 MED ORDER — HYDROMORPHONE HCL 1 MG/ML IJ SOLN
0.5000 mg | Freq: Once | INTRAMUSCULAR | Status: AC
  Administered 2015-02-19: 0.5 mg via INTRAVENOUS
  Filled 2015-02-19: qty 1

## 2015-02-19 MED ORDER — PREGABALIN 100 MG PO CAPS
100.0000 mg | ORAL_CAPSULE | Freq: Every day | ORAL | Status: DC
  Administered 2015-02-19: 100 mg via ORAL
  Filled 2015-02-19: qty 1

## 2015-02-19 MED ORDER — GADOBENATE DIMEGLUMINE 529 MG/ML IV SOLN
10.0000 mL | Freq: Once | INTRAVENOUS | Status: AC | PRN
  Administered 2015-02-19: 10 mL via INTRAVENOUS

## 2015-02-19 MED ORDER — SODIUM CHLORIDE 0.9 % IJ SOLN
10.0000 mL | Freq: Two times a day (BID) | INTRAMUSCULAR | Status: DC
  Administered 2015-02-19: 20 mL
  Administered 2015-02-19 – 2015-02-20 (×3): 10 mL
  Administered 2015-02-21: 20 mL
  Administered 2015-02-21 – 2015-02-22 (×3): 10 mL
  Administered 2015-02-23: 30 mL
  Administered 2015-02-24 – 2015-03-04 (×2): 10 mL

## 2015-02-19 MED ORDER — SODIUM CHLORIDE 0.9 % IV BOLUS (SEPSIS)
500.0000 mL | Freq: Once | INTRAVENOUS | Status: AC
  Administered 2015-02-19: 500 mL via INTRAVENOUS

## 2015-02-19 MED ORDER — PREGABALIN 100 MG PO CAPS
100.0000 mg | ORAL_CAPSULE | Freq: Two times a day (BID) | ORAL | Status: DC
  Administered 2015-02-20 – 2015-02-23 (×8): 100 mg via ORAL
  Filled 2015-02-19 (×2): qty 1
  Filled 2015-02-19: qty 4
  Filled 2015-02-19 (×5): qty 1

## 2015-02-19 MED ORDER — MIDODRINE HCL 5 MG PO TABS
10.0000 mg | ORAL_TABLET | Freq: Three times a day (TID) | ORAL | Status: DC
  Administered 2015-02-19 – 2015-03-06 (×43): 10 mg via ORAL
  Filled 2015-02-19 (×50): qty 2

## 2015-02-19 MED ORDER — SODIUM CHLORIDE 0.9 % IV SOLN
INTRAVENOUS | Status: DC
  Administered 2015-02-19 (×2): via INTRAVENOUS

## 2015-02-19 MED ORDER — METRONIDAZOLE IN NACL 5-0.79 MG/ML-% IV SOLN
500.0000 mg | Freq: Three times a day (TID) | INTRAVENOUS | Status: DC
  Administered 2015-02-20 – 2015-03-01 (×28): 500 mg via INTRAVENOUS
  Filled 2015-02-19 (×31): qty 100

## 2015-02-19 MED ORDER — TIGECYCLINE 50 MG IV SOLR
50.0000 mg | Freq: Two times a day (BID) | INTRAVENOUS | Status: DC
  Filled 2015-02-19: qty 100

## 2015-02-19 MED ORDER — DEXTROSE 5 % IV SOLN
2.0000 g | Freq: Three times a day (TID) | INTRAVENOUS | Status: DC
  Administered 2015-02-20 – 2015-03-06 (×43): 2 g via INTRAVENOUS
  Filled 2015-02-19 (×48): qty 2

## 2015-02-19 MED ORDER — FENTANYL CITRATE (PF) 100 MCG/2ML IJ SOLN
25.0000 ug | INTRAMUSCULAR | Status: DC | PRN
  Administered 2015-02-19 – 2015-02-23 (×10): 25 ug via INTRAVENOUS
  Filled 2015-02-19 (×10): qty 2

## 2015-02-19 MED ORDER — TIGECYCLINE 50 MG IV SOLR
100.0000 mg | Freq: Once | INTRAVENOUS | Status: AC
  Administered 2015-02-19: 100 mg via INTRAVENOUS
  Filled 2015-02-19: qty 100

## 2015-02-19 NOTE — Progress Notes (Signed)
Peripherally Inserted Central Catheter/Midline Placement  The IV Nurse has discussed with the patient and/or persons authorized to consent for the patient, the purpose of this procedure and the potential benefits and risks involved with this procedure.  The benefits include less needle sticks, lab draws from the catheter and patient may be discharged home with the catheter.  Risks include, but not limited to, infection, bleeding, blood clot (thrombus formation), and puncture of an artery; nerve damage and irregular heat beat.  Alternatives to this procedure were also discussed.  Consent signed by mother due to pt being paraplegic-pt gives verbal consent.  PICC/Midline Placement Documentation  PICC / Midline Single Lumen 08/67/61 PICC Right Basilic 38 cm 1 cm (Active)     PICC / Midline Double Lumen 95/09/32 PICC Right Basilic 39 cm 0 cm (Active)  Indication for Insertion or Continuance of Line Prolonged intravenous therapies;Limited venous access - need for IV therapy >5 days (PICC only) 02/19/2015  9:26 AM  Exposed Catheter (cm) 0 cm 02/19/2015  9:26 AM  Site Assessment Clean;Dry;Intact 02/19/2015  9:26 AM  Lumen #1 Status Flushed;Saline locked;Blood return noted 02/19/2015  9:26 AM  Lumen #2 Status Flushed;Saline locked;Blood return noted 02/19/2015  9:26 AM  Dressing Type Transparent 02/19/2015  9:26 AM  Dressing Status Clean;Dry;Intact;Antimicrobial disc in place 02/19/2015  9:26 AM  Line Care Connections checked and tightened 02/19/2015  9:26 AM  Line Adjustment (NICU/IV Team Only) No 02/19/2015  9:26 AM  Dressing Intervention New dressing 02/19/2015  9:26 AM  Dressing Change Due 02/26/15 02/19/2015  9:26 AM       Jason [name] 02/19/2015, 9:27 AM

## 2015-02-19 NOTE — Progress Notes (Signed)
Utilization review completed.  

## 2015-02-19 NOTE — Progress Notes (Addendum)
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 Johnnye Sima consulted Last debridement. 12/08/14 by Dr. Migdalia Dk Currently patient is only doing wet-to-dry dressings by wound care Air mattress   Chronic hypotension: Continue midodrine, increased dose today  Chronic suprapubic catheter :   Hyponatremia, sodium has dropped from 136> 129, in the last couple of weeks, likely secondary to poor oral intake continue normal saline  Severe Protein calorie malnutrition: Mx per dietitian recommendation.  Chronic anemia: Without evidence of bleeding. S/p 1 unit of PRBC on 3/8. Stable. Transfuse another unit of blood  Hemoglobin drops below 7.0  Uncontrolled DM 1: Mild DKA with metabolic acidosis Refusing insulin drip, accepted to take Lantus and sliding scale insulin Had a long discussion with mother at bedside, she basically feeds him regular food and compensate with higher prandial insulin.  [name] does not give him any insulin if his CBGs less than 200 Last hemoglobin A1c was 8.1   Neurogenic bladder:  Had Klebsiella and BURKHOLDERIA UTI on previous admissions  Spinal cord infarction and quadriplegia: Secondary to spinal cord infarction, patient awake alert and oriented 3. 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  S/p Diverting Colostomy.  Reactive thrombocytosis  Code Status: full Family Communication: family updated about patient's clinical progress Disposition Plan:  As above    Brief narrative: Jason [name] is a 29 y.o. male with hx of asthma, MRSA infection, seizures, spinal stroke in 4/15, PNA, UTI, ischial  osteomyelitis , followed by infectious disease clinic presents with fever and mucopurulent drainage from his chronic decubitus ulcers. Has had multiple admissions for the same multiple debridements. Recently seen by Dr. Tommy Medal and labs showed leukocytosis. Pt was sent to ER for evaluation . Pt has been afebrile, Notes that he has a lot of bloody drainage and this is probably why he is more anemic per his mother. The discharge from skin ulcer is yellow green per his mother, and smells. Pt will be admitted for sacral decubitus infection, and anemia, and mild dka.   Consultants:  Infectious disease  Procedures:  None  Antibiotics: Vancomycin  HPI/Subjective: Patient does not provide much history most of the history is provided by the patient's mother was by the bedside. Patient does endorse pain and is requesting pain medicine  Objective: Filed Vitals:   02/19/15 0600 02/19/15 0630 02/19/15 0700 02/19/15 0826  BP: 99/72 114/79 97/63 98/68   Pulse: 70 71 74 73  Temp: 98.2 F (36.8 C)   98 F (36.7 C)  TempSrc: Oral   Oral  Resp: 20 19 15 13   SpO2: 100% 100% 100% 100%    Intake/Output Summary (Last 24 hours) at 02/19/15 1115 Last data filed at 02/19/15 0600  Gross per 24 hour  Intake 4222.5 ml  Output   1325 ml  Net 2897.5 ml    Exam:  Multiple deep wounds to right buttock and perineal region. Large pressure wound to mid back. Wounds w moderate, malodorous, yellowish-green drainage. No surrounding cellulitis. No fluctuance/abscess noted. No crepitus. No grossly necrotic tissue.  Lungs: Clear to auscultation bilaterally without wheezes or crackles Cardiovascular: Regular rate and rhythm without murmur gallop or rub normal S1 and S2 Abdomen: Nontender, nondistended, soft, bowel sounds  positive, no rebound, no ascites, no appreciable mass Extremities: No significant cyanosis, clubbing, or edema bilateral lower extremities Neurological: He is alert and oriented to person,  place, and time. He displays abnormal reflex. He exhibits abnormal muscle tone.  + quadriplegia      Data Reviewed: Basic Metabolic Panel:  Recent Labs Lab 02/16/15 1545 02/18/15 1615 02/18/15 2049 02/19/15 0022  NA 129* 129* 129* 129*  K 4.7 4.1 3.7 3.9  CL 101 102 102 105  CO2 19* 17* 17* 16*  GLUCOSE 214* 243* 249* 222*  BUN 15 14 12 11   CREATININE 0.77 0.71 0.61 0.51*  CALCIUM 11.6* 11.0* 9.9 9.8  MG  --   --  1.1*  --   PHOS  --   --  2.6  --     Liver Function Tests:  Recent Labs Lab 02/16/15 1545 02/18/15 1615  AST 13* 9*  ALT 21 12*  ALKPHOS 130* 114  BILITOT 0.5 0.6  PROT 6.7 7.1  ALBUMIN 2.0* 2.0*   No results for input(s): LIPASE, AMYLASE in the last 168 hours. No results for input(s): AMMONIA in the last 168 hours.  CBC:  Recent Labs Lab 02/16/15 1545 02/18/15 1615  WBC 20.7* 17.6*  NEUTROABS 15.7* 11.6*  HGB 8.0* 7.2*  HCT 25.6* 23.6*  MCV 79.5 79.5  PLT 951* 812*    Cardiac Enzymes: No results for input(s): CKTOTAL, CKMB, CKMBINDEX, TROPONINI in the last 168 hours. BNP (last 3 results) No results for input(s): BNP in the last 8760 hours.  ProBNP (last 3 results) No results for input(s): PROBNP in the last 8760 hours.    CBG:  Recent Labs Lab 02/18/15 2114 02/18/15 2226 02/19/15 0024 02/19/15 0439 02/19/15 0825  GLUCAP 228* 227* 216* 215* 232*    Recent Results (from the past 240 hour(s))  MRSA PCR Screening     Status: None   Collection Time: 02/18/15  8:33 PM  Result Value Ref Range Status   MRSA by PCR NEGATIVE NEGATIVE Final    Comment:        The GeneXpert MRSA Assay (FDA approved for NASAL specimens only), is one component of a comprehensive MRSA colonization surveillance program. It is not intended to diagnose MRSA infection nor to guide or monitor treatment for MRSA infections.      Studies: No results found.  Scheduled Meds: . baclofen  10 mg Oral BID WC  . baclofen  20 mg Oral QHS  .  enoxaparin (LOVENOX) injection  30 mg Subcutaneous Q24H  . feeding supplement (GLUCERNA SHAKE)  237 mL Oral TID BM  . fentaNYL  25 mcg Transdermal Q72H  . insulin aspart  0-9 Units Subcutaneous TID WC  . insulin glargine  25 Units Subcutaneous Daily  . loratadine  10 mg Oral Daily  . midodrine  10 mg Oral BID WC  . pantoprazole  40 mg Oral Daily  . potassium chloride SA  20 mEq Oral Daily  . pregabalin  100 mg Oral Daily  . pregabalin  200 mg Oral QHS  . sodium chloride  10-40 mL Intracatheter Q12H  . vancomycin  500 mg Intravenous Q12H   Continuous Infusions: . sodium chloride 150 mL/hr at 02/19/15 9562    Active Problems:   Osteomyelitis   Anemia   DKA (diabetic ketoacidoses)   Infected wound    Time spent: 40 minutes   [facility] Pager [phone]. If 7PM-7AM, please contact night-coverage at [email or web address], password [facility] 02/19/2015, 11:15 AM  LOS:  1 day
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
## 2015-02-19 NOTE — Progress Notes (Signed)
Pharmacy Consult - Antibiotic Review  Mr. Kusek has had multiple admissions in the last 6 months and has received multiple inpatient and outpatient courses of antibiotics.  I was asked by Dr. Allyson Sabal to summarize his antibiotic course for the last 6 months.  I was able to access data from inpatient admissions and discharge prescriptions.  I can not confirm that he completed these courses outpatient as prescribed.  Aztreonam: Mar 6-11, Mar 14-18 (+10d outpt Rx), Mar 31-Apr 6, and current course started May 13  Cipro: Apr 25 x 1 dose  Ertapenem: Received an outpt prescription for 6 weeks of therapy starting Feb 5  Fluconazole: Mar 31-Apr 8 (+14d outpt Rx)  Levofloxacin: Jan 3-6, Feb 27-Mar 6 (+outpt Rx through 3/25 to complete 6 weeks of therapy)  Linezolid: Feb 2-8  Meropenem: Feb 1-12, Feb 25-27, Mar 11-14  Metronidazole: Feb 27-Mar 18 (+10d outpt Rx), Apr 3-6  Tigacycline: Apr 6-8 (+14d outpt Rx)  Vancomycin: Jan 1-8, Feb 8-11, Feb 25-Mar 11, Mar 14-18 (+10d outpt Rx), Mar 31-Apr 5, and current course started May 13   [place], Pharm.D., BCPS Clinical Pharmacist Pager [phone] 02/19/2015 10:26 AM

## 2015-02-20 ENCOUNTER — Encounter (HOSPITAL_COMMUNITY): Payer: Self-pay | Admitting: Radiology

## 2015-02-20 ENCOUNTER — Inpatient Hospital Stay (HOSPITAL_COMMUNITY): Payer: Medicaid Other

## 2015-02-20 DIAGNOSIS — Z515 Encounter for palliative care: Secondary | ICD-10-CM

## 2015-02-20 DIAGNOSIS — T798XXS Other early complications of trauma, sequela: Secondary | ICD-10-CM

## 2015-02-20 DIAGNOSIS — I2699 Other pulmonary embolism without acute cor pulmonale: Secondary | ICD-10-CM

## 2015-02-20 DIAGNOSIS — M869 Osteomyelitis, unspecified: Principal | ICD-10-CM

## 2015-02-20 LAB — COMPREHENSIVE METABOLIC PANEL
ALK PHOS: 105 U/L (ref 38–126)
ALT: 9 U/L — AB (ref 17–63)
AST: 16 U/L (ref 15–41)
Albumin: 1.4 g/dL — ABNORMAL LOW (ref 3.5–5.0)
Anion gap: 6 (ref 5–15)
BUN: 10 mg/dL (ref 6–20)
CALCIUM: 9.1 mg/dL (ref 8.9–10.3)
CO2: 15 mmol/L — AB (ref 22–32)
CREATININE: 0.52 mg/dL — AB (ref 0.61–1.24)
Chloride: 109 mmol/L (ref 101–111)
GFR calc Af Amer: >60 mL/min (ref >60)
GFR calc non Af Amer: >60 mL/min (ref >60)
GLUCOSE: 207 mg/dL — AB (ref 65–99)
Potassium: 4.6 mmol/L (ref 3.5–5.1)
Sodium: 130 mmol/L — ABNORMAL LOW (ref 135–145)
Total Bilirubin: 1.1 mg/dL (ref 0.3–1.2)
Total Protein: 5.7 g/dL — ABNORMAL LOW (ref 6.5–8.1)

## 2015-02-20 LAB — GLUCOSE, CAPILLARY
GLUCOSE-CAPILLARY: 130 mg/dL — AB (ref 65–99)
GLUCOSE-CAPILLARY: 226 mg/dL — AB (ref 65–99)
Glucose-Capillary: 200 mg/dL — ABNORMAL HIGH (ref 65–99)
Glucose-Capillary: 160 mg/dL — ABNORMAL HIGH (ref 65–99)
Glucose-Capillary: 94 mg/dL (ref 65–99)

## 2015-02-20 LAB — TYPE AND SCREEN
ABO/RH(D): O POS
ANTIBODY SCREEN: NEGATIVE
Unit division: 0
Unit division: 0

## 2015-02-20 LAB — BLOOD GAS, ARTERIAL
ACID-BASE DEFICIT: 9.4 mmol/L — AB (ref 0.0–2.0)
Acid-base deficit: 9.7 mmol/L — ABNORMAL HIGH (ref 0.0–2.0)
Bicarbonate: 16.5 mEq/L — ABNORMAL LOW (ref 20.0–24.0)
Bicarbonate: 15.4 mEq/L — ABNORMAL LOW (ref 20.0–24.0)
Drawn by: 236041
Drawn by: 236041
FIO2: 1 %
FIO2: 1 %
O2 SAT: 86.8 %
O2 Saturation: 81.3 %
PCO2 ART: 30.6 mmHg — AB (ref 35.0–45.0)
PH ART: 7.323 — AB (ref 7.350–7.450)
Patient temperature: 98.6
Patient temperature: 98.6
TCO2: 17.7 mmol/L (ref 0–100)
TCO2: 16.4 mmol/L (ref 0–100)
pCO2 arterial: 41.2 mmHg (ref 35.0–45.0)
pH, Arterial: 7.225 — ABNORMAL LOW (ref 7.350–7.450)
pO2, Arterial: 52.5 mmHg — ABNORMAL LOW (ref 80.0–100.0)
pO2, Arterial: 53.7 mmHg — ABNORMAL LOW (ref 80.0–100.0)

## 2015-02-20 LAB — CBC
HCT: 30.7 % — ABNORMAL LOW (ref 39.0–52.0)
Hemoglobin: 9.9 g/dL — ABNORMAL LOW (ref 13.0–17.0)
MCH: 26.2 pg (ref 26.0–34.0)
MCHC: 32.2 g/dL (ref 30.0–36.0)
MCV: 81.2 fL (ref 78.0–100.0)
Platelets: 767 10*3/uL — ABNORMAL HIGH (ref 150–400)
RBC: 3.78 MIL/uL — ABNORMAL LOW (ref 4.22–5.81)
RDW: 14.6 % (ref 11.5–15.5)
WBC: 21.3 10*3/uL — ABNORMAL HIGH (ref 4.0–10.5)

## 2015-02-20 LAB — HEPARIN LEVEL (UNFRACTIONATED): Heparin Unfractionated: <0.1 IU/mL — ABNORMAL LOW (ref 0.30–0.70)

## 2015-02-20 LAB — PARATHYROID HORMONE, INTACT (NO CA): PTH: 5 pg/mL — ABNORMAL LOW (ref 15–65)

## 2015-02-20 LAB — TROPONIN I
Troponin I: <0.03 ng/mL (ref <0.031)
Troponin I: <0.03 ng/mL (ref <0.031)
Troponin I: <0.03 ng/mL (ref <0.031)

## 2015-02-20 LAB — MAGNESIUM: Magnesium: 3 mg/dL — ABNORMAL HIGH (ref 1.7–2.4)

## 2015-02-20 MED ORDER — HEPARIN (PORCINE) IN NACL 100-0.45 UNIT/ML-% IJ SOLN
1400.0000 [IU]/h | INTRAMUSCULAR | Status: DC
  Administered 2015-02-20: 900 [IU]/h via INTRAVENOUS
  Administered 2015-02-21: 1200 [IU]/h via INTRAVENOUS
  Administered 2015-02-22: 1400 [IU]/h via INTRAVENOUS
  Filled 2015-02-20 (×6): qty 250

## 2015-02-20 MED ORDER — MORPHINE SULFATE 2 MG/ML IJ SOLN
2.0000 mg | Freq: Once | INTRAMUSCULAR | Status: AC
  Administered 2015-02-20: 2 mg via INTRAVENOUS

## 2015-02-20 MED ORDER — INSULIN GLARGINE 100 UNIT/ML ~~LOC~~ SOLN
30.0000 [IU] | Freq: Every day | SUBCUTANEOUS | Status: DC
  Administered 2015-02-20: 30 [IU] via SUBCUTANEOUS
  Filled 2015-02-20 (×2): qty 0.3

## 2015-02-20 MED ORDER — VANCOMYCIN HCL 500 MG IV SOLR
500.0000 mg | Freq: Two times a day (BID) | INTRAVENOUS | Status: DC
  Administered 2015-02-20 – 2015-02-22 (×5): 500 mg via INTRAVENOUS
  Filled 2015-02-20 (×7): qty 500

## 2015-02-20 MED ORDER — IOHEXOL 350 MG/ML SOLN
80.0000 mL | Freq: Once | INTRAVENOUS | Status: AC | PRN
  Administered 2015-02-20: 60 mL via INTRAVENOUS

## 2015-02-20 MED ORDER — MAGNESIUM SULFATE 4 GM/100ML IV SOLN
4.0000 g | Freq: Once | INTRAVENOUS | Status: AC
  Administered 2015-02-20: 4 g via INTRAVENOUS
  Filled 2015-02-20: qty 100

## 2015-02-20 MED ORDER — MORPHINE SULFATE 2 MG/ML IJ SOLN
INTRAMUSCULAR | Status: AC
  Filled 2015-02-20: qty 1

## 2015-02-20 MED ORDER — VANCOMYCIN HCL IN DEXTROSE 1-5 GM/200ML-% IV SOLN
1000.0000 mg | Freq: Once | INTRAVENOUS | Status: AC
  Administered 2015-02-20: 1000 mg via INTRAVENOUS
  Filled 2015-02-20: qty 200

## 2015-02-20 NOTE — Progress Notes (Signed)
02/20/2015 Radiologist called 0900 about CT angio showed bilateral PE in lower lobes. RN notified Dr Allyson Sabal and she is aware. [facility] RN.

## 2015-02-20 NOTE — Progress Notes (Addendum)
[facility] PROGRESS NOTE  Jason [name] VQM:[id number] DOB: Jun 17, 1986 DOA: 02/18/2015 PCP: [name], MD  Assessment/Plan: Active Problems:   Osteomyelitis   Anemia   DKA (diabetic ketoacidoses)   Infected wound   Acute Hypoxemic respiratory failure Respiratory distress with complaints of chest pain last night CT angiogram shows Acute pulmonary embolus involving bilateral lower lobes with extensive consolidation of the bilateral lower lobes as described. The consolidations are most likely impart due to pulmonary infarction but underlying pneumonia is not excluded. 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 Johnnye Sima consulted Last debridement. 12/08/14 by Dr. Migdalia Dk, will reconsult , doubt he is a surgical candidate  Wound care consultation pending Air mattress Currently on Azactam, Tygacil, metronidazole  Worsening leukocytosis likely secondary to pulmonary infarction  Chronic hypotension: Continue midodrine,   Chronic suprapubic catheter :   Hyponatremia, sodium has dropped from 136> 129, in the last couple of weeks, likely secondary to poor oral intake continue normal saline, improving  Severe Protein calorie malnutrition: Mx per dietitian recommendation.  Chronic anemia: Without evidence of bleeding. S/p 1 unit of PRBC on 3/8.  Stable.  Status post transfusion of 2 units on 5/14 Transfuse for active bleeding or hemoglobin less than 7  Uncontrolled DM 1: Mild DKA with metabolic acidosis Refusing insulin drip, accepted to take Lantus and sliding scale insulin, increased dose of Lantus as CBGs still uncontrolled   Had a long discussion with mother at bedside, she basically feeds him regular food and compensate with higher prandial insulin.  [name] does not give him any insulin if his CBGs less than 200 Last hemoglobin A1c was 8.1   Neurogenic bladder:  Had Klebsiella and BURKHOLDERIA UTI on previous admissions  Spinal cord infarction and quadriplegia: Secondary to spinal cord infarction, patient awake alert and oriented 3. 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  S/p Diverting Colostomy.  Reactive thrombocytosis  Code Status: full Family Communication: family updated about patient's clinical progress Disposition Plan: Overall prognosis is extremely grim, will obtain a palliative care consultation after discussion with the family   Brief narrative: Jason [name] is a 29 y.o. male with hx of asthma, MRSA infection, seizures, spinal stroke in 4/15, PNA, UTI, ischial osteomyelitis , followed by infectious disease clinic presents with fever and mucopurulent drainage from his chronic decubitus ulcers. Has had multiple admissions for the same multiple debridements. Recently seen by Dr. Tommy Medal and labs showed leukocytosis. Pt was sent to ER for evaluation . Pt has been afebrile, Notes that he has a lot of bloody drainage and this is probably why he is more anemic per his mother. The discharge from skin ulcer is yellow green per his mother, and smells. Pt will be admitted for sacral decubitus infection, and anemia, and mild dka.   Consultants:  Infectious disease  Procedures:  None  Antibiotics: Vancomycin  HPI/Subjective: Blood pressure soft, still complaining of chest pain and  shortness of breath  Objective: Filed Vitals:  02/20/15 0600 02/20/15 0710 02/20/15 0800 02/20/15 0819  BP: 100/68 91/56 95/65  95/65  Pulse: 76 72 71 73  Temp:    97 F (36.1 C)  TempSrc:    Axillary  Resp: 20 15 12 15   SpO2: 99% 98% 100% 100%    Intake/Output Summary (Last 24 hours) at 02/20/15 0850 Last data filed at 02/20/15 0500  Gross per 24 hour  Intake   1650 ml  Output   1850 ml  Net   -200 ml    Exam:  Multiple deep wounds to right buttock and perineal region. Large pressure wound to mid back. Wounds w moderate, malodorous, yellowish-green drainage. No surrounding cellulitis. No fluctuance/abscess noted. No crepitus. No grossly necrotic tissue.  Lungs: Clear to auscultation bilaterally without wheezes or crackles Cardiovascular: Regular rate and rhythm without murmur gallop or rub normal S1 and S2 Abdomen: Nontender, nondistended, soft, bowel sounds positive, no rebound, no ascites, no appreciable mass Extremities: No significant cyanosis, clubbing, or edema bilateral lower extremities Neurological: He is alert and oriented to person, place, and time. He displays abnormal reflex. He exhibits abnormal muscle tone.  + quadriplegia      Data Reviewed: Basic Metabolic Panel:  Recent Labs Lab 02/18/15 1615 02/18/15 2049 02/19/15 0022 02/19/15 1327 02/20/15 0456  NA 129* 129* 129* 131* 130*  K 4.1 3.7 3.9 4.2 4.6  CL 102 102 105 108 109  CO2 17* 17* 16* 16* 15*  GLUCOSE 243* 249* 222* 180* 207*  BUN 14 12 11 9 10   CREATININE 0.71 0.61 0.51* 0.52* 0.52*  CALCIUM 11.0* 9.9 9.8 9.6 9.1  MG  --  1.1*  --   --   --   PHOS  --  2.6  --   --   --     Liver Function Tests:  Recent Labs Lab 02/16/15 1545 02/18/15 1615 02/20/15 0456  AST 13* 9* 16  ALT 21 12* 9*  ALKPHOS 130* 114 105  BILITOT 0.5 0.6 1.1  PROT 6.7 7.1 5.7*  ALBUMIN 2.0* 2.0* 1.4*   No results for input(s): LIPASE, AMYLASE in the last 168 hours. No results for input(s): AMMONIA in  the last 168 hours.  CBC:  Recent Labs Lab 02/16/15 1545 02/18/15 1615 02/19/15 1327 02/20/15 0456  WBC 20.7* 17.6* 19.0* 21.3*  NEUTROABS 15.7* 11.6*  --   --   HGB 8.0* 7.2* 10.0* 9.9*  HCT 25.6* 23.6* 30.9* 30.7*  MCV 79.5 79.5 80.9 81.2  PLT 951* 812* 779* 767*    Cardiac Enzymes:  Recent Labs Lab 02/20/15 0458  TROPONINI <0.03   BNP (last 3 results) No results for input(s): BNP in the last 8760 hours.  ProBNP (last 3 results) No results for input(s): PROBNP in the last 8760 hours.    CBG:  Recent Labs Lab 02/19/15 0825 02/19/15 1304 02/19/15 1728 02/19/15 2148 02/20/15 0034  GLUCAP 232* 173* 186* 202* 226*    Recent Results (from the past 240 hour(s))  MRSA PCR Screening     Status: None   Collection Time: 02/18/15  8:33 PM  Result Value Ref Range Status   MRSA by PCR NEGATIVE NEGATIVE Final    Comment:        The GeneXpert MRSA Assay (FDA approved for NASAL specimens only), is one component of a comprehensive MRSA colonization surveillance program. It is not intended to diagnose MRSA infection nor to guide or monitor treatment for MRSA infections.      Studies: Ct Angio Chest Pe  W/cm &/or Wo Cm  02/20/2015   CLINICAL DATA:  Acute respiratory failure, hypoxia, shortness of breath, chest pain.  EXAM: CT ANGIOGRAPHY CHEST WITH CONTRAST  TECHNIQUE: Multidetector CT imaging of the chest was performed using the standard protocol during bolus administration of intravenous contrast. Multiplanar CT image reconstructions and MIPs were obtained to evaluate the vascular anatomy.  CONTRAST:  36mL OMNIPAQUE IOHEXOL 350 MG/ML SOLN  COMPARISON:  January 11, 2015  FINDINGS: There is acute pulmonary embolus in segmental and subsegmental pulmonary arteries of the left lower lobe. There is acute pulmonary embolus in subsegmental pulmonary arteries of the right lower lobe. There are extensive consolidation of the right lower lobe and the medial aspect of the left lower  lobe. There are small bilateral pleural effusions. There are lymphadenopathy in the mediastinum. The aorta is normal. There is small pericardial effusion. The visualized upper abdominal structures are normal.  Review of the MIP images confirms the above findings.  IMPRESSION: Acute pulmonary embolus involving bilateral lower lobes with extensive consolidation of the bilateral lower lobes as described. The consolidations are most likely impart due to pulmonary infarction but underlying pneumonia is not excluded.  Critical Value/emergent results were called by telephone at the time of interpretation on 02/20/2015 at 8:33 am to patient's nurse Clarisa Fling who verbally acknowledged these results.   Electronically Signed   By: Abelardo Diesel M.D.   On: 02/20/2015 08:40   Mr Lumbar Spine Wo Contrast  02/19/2015   CLINICAL DATA:  Infected sacral decubitus ulcers. Fever. Leukocytosis.  EXAM: MRI LUMBAR SPINE WITHOUT CONTRAST, MRI OF THE SACRUM WITH AND WITHOUT CONTRAST  TECHNIQUE: Multiplanar, multisequence MR imaging of the lumbar spine and sacrum was performed. Contrast was performed for the sacral imaging.  CONTRAST:  10 cc MultiHance  COMPARISON:  CT scan of the abdomen and pelvis dated 01/11/2015 and MRI of the pelvis dated 01/08/2015  FINDINGS: MRI of the lumbar spine:  Normal conus tip at L1-2. There is diffuse edema in the posterior paraspinal musculature and in the gluteal muscles. There is also atrophy of the paraspinal musculature. The patient is quadriplegic. The paraspinal soft tissues are otherwise normal.  The discs from T11-12 through L5-S1 are normal. There is low signal throughout the bones of the lumbar spine which may represent a red marrow reactivation. There is no evidence of spinal infection. No spinal or foraminal stenosis or facet arthritis.  MRI of the sacrum:  There is abnormal edema and enhancement of the right ischial tuberosity. There is a deep decubitus ulcer that extends to the ischial  tuberosity and there is a small abscess extending to enhance cm laterally from the decubitus ulcer at the ischial tuberosity. This pus collection does communicate with the decubitus ulcer.  There is devitalized soft tissue superficial to the left ischial tuberosity at the site of another decubitus ulcer. There is also devitalized soft tissue at the site of the soft tissue ulcer overlying the sacrum. There is slight edema and enhancement of the remnant of the fifth sacral segment and of the entire fourth thick sacral segment but there is no bone destruction.  No evidence of epidural infection in the sacrum.  There is abnormal subcutaneous edema throughout the pelvis with muscle atrophy.  IMPRESSION: 1. Multiple sacral and ischial tuberosity decubitus ulcers. Pus extends from the depths of the right ischial tuberosity ulcer 2.5 cm laterally. 2. Abnormal edema and enhancement of the right ischial tuberosity with loss of the cortical margin, consistent with osteomyelitis. 3. Abnormal  edema and enhancement of the fourth sacral segment and of the remnant of the fifth sacral segment. This is immediately adjacent to a sacral decubitus ulcer. This is not definitive for osteomyelitis however.   Electronically Signed   By: Lorriane Shire M.D.   On: 02/19/2015 13:16   Mr Sacrum/si Joints W Wo Contrast  02/19/2015   CLINICAL DATA:  Infected sacral decubitus ulcers. Fever. Leukocytosis.  EXAM: MRI LUMBAR SPINE WITHOUT CONTRAST, MRI OF THE SACRUM WITH AND WITHOUT CONTRAST  TECHNIQUE: Multiplanar, multisequence MR imaging of the lumbar spine and sacrum was performed. Contrast was performed for the sacral imaging.  CONTRAST:  10 cc MultiHance  COMPARISON:  CT scan of the abdomen and pelvis dated 01/11/2015 and MRI of the pelvis dated 01/08/2015  FINDINGS: MRI of the lumbar spine:  Normal conus tip at L1-2. There is diffuse edema in the posterior paraspinal musculature and in the gluteal muscles. There is also atrophy of the  paraspinal musculature. The patient is quadriplegic. The paraspinal soft tissues are otherwise normal.  The discs from T11-12 through L5-S1 are normal. There is low signal throughout the bones of the lumbar spine which may represent a red marrow reactivation. There is no evidence of spinal infection. No spinal or foraminal stenosis or facet arthritis.  MRI of the sacrum:  There is abnormal edema and enhancement of the right ischial tuberosity. There is a deep decubitus ulcer that extends to the ischial tuberosity and there is a small abscess extending to enhance cm laterally from the decubitus ulcer at the ischial tuberosity. This pus collection does communicate with the decubitus ulcer.  There is devitalized soft tissue superficial to the left ischial tuberosity at the site of another decubitus ulcer. There is also devitalized soft tissue at the site of the soft tissue ulcer overlying the sacrum. There is slight edema and enhancement of the remnant of the fifth sacral segment and of the entire fourth thick sacral segment but there is no bone destruction.  No evidence of epidural infection in the sacrum.  There is abnormal subcutaneous edema throughout the pelvis with muscle atrophy.  IMPRESSION: 1. Multiple sacral and ischial tuberosity decubitus ulcers. Pus extends from the depths of the right ischial tuberosity ulcer 2.5 cm laterally. 2. Abnormal edema and enhancement of the right ischial tuberosity with loss of the cortical margin, consistent with osteomyelitis. 3. Abnormal edema and enhancement of the fourth sacral segment and of the remnant of the fifth sacral segment. This is immediately adjacent to a sacral decubitus ulcer. This is not definitive for osteomyelitis however.   Electronically Signed   By: Lorriane Shire M.D.   On: 02/19/2015 13:16   Dg Chest Port 1 View  02/20/2015   CLINICAL DATA:  Patient woke up this morning with chest pain and shortness of breath.  EXAM: PORTABLE CHEST - 1 VIEW   COMPARISON:  CT chest 01/11/2015.  Chest 01/10/2015.  FINDINGS: Borderline heart size with normal pulmonary vascularity. Patchy bilateral basilar airspace disease is improving since previous study. Small bilateral pleural effusions. Right PICC catheter tip is over the cavoatrial junction. No pneumothorax. Old fracture deformity of the right clavicle.  IMPRESSION: Basilar airspace infiltrates are improving since previous study. Small bilateral pleural effusions.   Electronically Signed   By: Lucienne Capers M.D.   On: 02/20/2015 05:42    Scheduled Meds: . aztreonam  2 g Intravenous 3 times per day  . baclofen  10 mg Oral BID WC  . baclofen  20 mg  Oral QHS  . enoxaparin (LOVENOX) injection  30 mg Subcutaneous Q24H  . feeding supplement (GLUCERNA SHAKE)  237 mL Oral TID BM  . fentaNYL  25 mcg Transdermal Q72H  . insulin aspart  0-9 Units Subcutaneous TID WC  . insulin glargine  25 Units Subcutaneous Daily  . loratadine  10 mg Oral Daily  . metronidazole  500 mg Intravenous Q8H  . midodrine  10 mg Oral TID WC  . pantoprazole  40 mg Oral Daily  . potassium chloride SA  20 mEq Oral Daily  . pregabalin  100 mg Oral BID  . pregabalin  200 mg Oral QHS  . sodium chloride  10-40 mL Intracatheter Q12H   Continuous Infusions: . sodium chloride 150 mL/hr at 02/19/15 2232    Active Problems:   Osteomyelitis   Anemia   DKA (diabetic ketoacidoses)   Infected wound    Time spent: 40 minutes   [facility] Pager [phone]. If 7PM-7AM, please contact night-coverage at [email or web address], password [facility] 02/20/2015, 8:50 AM  LOS: 2 days

## 2015-02-20 NOTE — Progress Notes (Signed)
ANTICOAGULATION CONSULT NOTE - Initial Consult  Pharmacy Consult for Heparin Indication: pulmonary embolus  Allergies  Allergen Reactions  . Cefuroxime Axetil Anaphylaxis  . Ertapenem Other (See Comments)    Rash and confusion  . Morphine And Related Other (See Comments)    Changed mental status, confusion, headache, visual hallucination  . Penicillins Anaphylaxis and Other (See Comments)    ?can take amoxicillin?  . Sulfa Antibiotics Anaphylaxis, Shortness Of Breath and Other (See Comments)  . Tessalon [Benzonatate] Anaphylaxis  . Shellfish Allergy Itching and Other (See Comments)    Took benadryl to alleviate reaction    Patient Measurements:   Heparin Dosing Weight: 50.8 kg  Vital Signs: Temp: 97 F (36.1 C) (05/15 0819) Temp Source: Axillary (05/15 0819) BP: 95/65 mmHg (05/15 0819) Pulse Rate: 73 (05/15 0819)  Labs:  Recent Labs  02/18/15 1615  02/19/15 0022 02/19/15 1327 02/20/15 0456 02/20/15 0458  HGB 7.2*  --   --  10.0* 9.9*  --   HCT 23.6*  --   --  30.9* 30.7*  --   PLT 812*  --   --  779* 767*  --   CREATININE 0.71  < > 0.51* 0.52* 0.52*  --   TROPONINI  --   --   --   --   --  <0.03  < > = values in this interval not displayed.  Estimated Creatinine Clearance: 97 mL/min (by C-G formula based on Cr of 0.52).   Medical History: Past Medical History  Diagnosis Date  . GERD (gastroesophageal reflux disease)   . Asthma   . Hx MRSA infection     on face  . Gastroparesis   . Diabetic neuropathy   . Seizures   . Family history of anesthesia complication     Pt mother can't have epidural procedures  . Dysrhythmia   . Pneumonia   . Arthritis   . Fibromyalgia   . 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 Type I diabetes mellitus     sees Dr. Loanne Drilling   . Syncope 02/16/2015    Medications:  Infusions:  . sodium chloride 150 mL/hr at 02/19/15 2232  . heparin      Assessment: 14 yoM presents on 5/13 with history of sacral  decub ulcer and quadriplegia from [facility] after finding he had a leukocytosis.  Overnight on 5/14 he began complaining of SOB.  CT angiogram positive for acute bilateral PEs in lower lobes.  Pharmacy consulted to dose heparin for new onset PEs.  Patient likely not a candidate for long-term anticoagulation as he has bloody drainage from sacral ulcer and may consider IVC filter placement. Per MD due to significant clot burden will begin heparin. Of note, patient was anemic on admission with a Hgb of 7.2 (S/P 2 Units PRBCs).  Hgb has since improved and is stable at 9.9.  Plts 767.  Spoke with [name], his nurse this morning, and she has not noted much bleeding from Ulcer site.  Will not give bolus due to risk of bleeding but start at the upper end of normal for a rate.  Nursing is aware to monitor for bleeding and to alert pharmacy.  Goal of Therapy:  Heparin level 0.3-0.7 units/ml Monitor platelets by anticoagulation protocol: Yes   Plan:  - Start Heparin 900 units/hr (No bolus) - 1530 HL - Daily HL, CBC, monitor for signs of bleeding  Theron Arista, PharmD Clinical Pharmacist - Resident Pager: [phone] 5/15/20169:29 AM

## 2015-02-20 NOTE — Progress Notes (Signed)
Patient ID: Jason [name], male   DOB: 03-11-1986, 29 y.o.   MRN: [id number]  Consultation Note Date: 02/20/2015   Patient Name: Jason [name]  DOB: Mar 16, 1986  MRN: [id number]  Age / Sex: 29 y.o., male   PCP: [name], MD Referring Physician: Reyne Dumas, MD  Reason for Consultation: Establishing goals of care  Palliative Care Assessment and Plan Summary of Established Goals of Care and Medical Treatment Preferences    Palliative Care Discussion Held Today Contacts/Participants in Discussion: Primary Decision Maker: Mother  HCPOA: yes. Pt is unmarried with no biological children. Pt was not involved in discussion. He did not awaken to voice and history gleaned from Mother.   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 He was engaged at the time and step-father to her 3 children. Initially after event Virgal did very well in rehab. He got to where he could feed him and help with transfer. Mother states she has seen a decline since developing decubiti starting in September 2015. Despite his many limitations, complications, and losses, she describes [place] as engaged with his family and life. She states he has been able to find joy in life despite frequent hospitalizations and clinical decline. We did talk frankly about feeling pressured in terms of medical decision making specifically DNR status in the past. She tells me she knows " I would be foolish not to  think about it" but feels he is not there yet. She reports she and Linkyn have talked about what he would want in the future but this conversation sounds vague particularly in terms of the severity of his illness. She understands he has osteomyelitis again but sees him as having overcome this in the past and the possibility exists still for [facility]. We did discuss that he now has bilateral pulmonary emboli and the seriousness of this complication. She relates an ICU stay that [facility] had in the past where he was  ventilated and the time had come to discuss a trach which she declined. She was approached about making him a DNR at that time and she refused. He recovered to come back home again , so this is her framework and thought process at this point. We agreed that Palliative Medicine would continue to be a resource especially in terms of keeping her informed. We also discussed that we were available if and when she " feels it is time" to further address issues such as code status. I suspect though that she would allow full life support including mechanical ventilation as he has been ventilated in the past and did come off the vent to return home.  Code Status/Advance Care Planning:  Full Code  Symptom Management:   Pt has had pain but as he is so critically ill, receiving heparin for PE's multiple abx as well as considering surgery,will defer to attending and CCM  Psycho-social/Spiritual:   Support System: Yes, his mother and family  Desire for further Chaplaincy support: not at this point  Prognosis: Unable to determine  Discharge Planning:  Home with [place]       Chief Complaint/History of Present Illness: Pt is a 29 yo man with a h/o quadraplegia, osteomyelitis secondary to worsening decubiti admitted because of infected decubiti. He developed respiratory distress this am and scan revealed bilateral pulmonary emboli  Primary Diagnoses  Present on Admission:  . Infected wound . DKA (diabetic ketoacidoses) . Osteomyelitis . Anemia  Palliative Review of Systems: Pt did  not respond to my voice  I have reviewed the medical record, interviewed the patient and family, and examined the patient. The following aspects are pertinent.  Past Medical History  Diagnosis Date  . GERD (gastroesophageal reflux disease)   . Asthma   . Hx MRSA infection     on face  . Gastroparesis   . Diabetic neuropathy   . Seizures   . Family history of anesthesia complication     Pt mother can't have  epidural procedures  . Dysrhythmia   . Pneumonia   . Arthritis   . Fibromyalgia   . 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 Type I diabetes mellitus     sees Dr. Loanne Drilling   . Syncope 02/16/2015   History   Social History  . Marital Status: Single    Spouse Name: N/A  . Number of Children: 0  . Years of Education: 11   Occupational History  . Disability    Social History Main Topics  . Smoking status: Former Smoker -- 1.00 packs/day for 12 years    Types: Cigars, Cigarettes    Quit date: 09/07/2014  . Smokeless tobacco: Never Used  . Alcohol Use: No  . Drug Use: No  . Sexual Activity: Not Currently   Other Topics Concern  . None   Social History Narrative   Patient lives at home with mother father.    Patient has 2 children.    Patient is single.    Patient was left hand but after stroke patient is now right handed.    Patient has  11th grade education.    Family History  Problem Relation Age of Onset  . Diabetes Father   . Hypertension Father   . Asthma      fhx  . Hypertension      fhx  . Stroke      fhx  . Heart disease Mother    Scheduled Meds: . aztreonam  2 g Intravenous 3 times per day  . baclofen  10 mg Oral BID WC  . baclofen  20 mg Oral QHS  . feeding supplement (GLUCERNA SHAKE)  237 mL Oral TID BM  . fentaNYL  25 mcg Transdermal Q72H  . insulin aspart  0-9 Units Subcutaneous TID WC  . insulin glargine  30 Units Subcutaneous Daily  . loratadine  10 mg Oral Daily  . metronidazole  500 mg Intravenous Q8H  . midodrine  10 mg Oral TID WC  . pantoprazole  40 mg Oral Daily  . potassium chloride SA  20 mEq Oral Daily  . pregabalin  100 mg Oral BID  . pregabalin  200 mg Oral QHS  . sodium chloride  10-40 mL Intracatheter Q12H  . vancomycin  500 mg Intravenous Q12H   Continuous Infusions: . sodium chloride 150 mL/hr at 02/19/15 2232  . heparin 1,050 Units/hr (02/20/15 1705)   PRN Meds:.albuterol, fentaNYL (SUBLIMAZE)  injection, lidocaine, ondansetron, oxyCODONE-acetaminophen **AND** oxyCODONE, sodium chloride, sodium chloride Medications Prior to Admission:  Prior to Admission medications   Medication Sig Start Date End Date Taking? Authorizing Provider  B Complex-Folic Acid (B COMPLEX-VITAMIN B12 PO) Take 1 tablet by mouth at bedtime.   Yes Historical Provider, MD  fentaNYL (DURAGESIC - DOSED MCG/HR) 25 MCG/HR patch Place 1 patch (25 mcg total) onto the skin every 3 (three) days. 01/24/15  Yes Meredith Staggers, MD  ibuprofen (ADVIL,MOTRIN) 200 MG tablet Take 400 mg by mouth every 4 (  four) hours as needed for fever.   Yes Historical Provider, MD  lidocaine (LMX) 4 % cream Apply 1 application topically daily as needed (gum pain).   Yes Historical Provider, MD  omeprazole (PRILOSEC) 40 MG capsule Take 40 mg by mouth daily.   Yes Historical Provider, MD  acetaminophen (TYLENOL) 325 MG tablet Take 2 tablets (650 mg total) by mouth every 6 (six) hours as needed for mild pain, moderate pain, fever or headache. 01/14/15   Geradine Girt, DO  albuterol (PROVENTIL) (2.5 MG/3ML) 0.083% nebulizer solution Take 3 mLs (2.5 mg total) by nebulization every 4 (four) hours as needed for wheezing or shortness of breath. 04/27/14   [name], MD  baclofen (LIORESAL) 10 MG tablet Take 1-2 tablets (10-20 mg total) by mouth 3 (three) times daily. Takes 10mg  in morning and lunchtime and takes 20mg  at bedtime 11/29/14   Meredith Staggers, MD  collagenase (SANTYL) ointment Apply 1 application topically daily. 08/30/14   [name], MD  dexlansoprazole (DEXILANT) 60 MG capsule Take 1 capsule (60 mg total) by mouth daily. Patient not taking: Reported on 02/20/2015 11/19/14   Jonetta Osgood, MD  diclofenac sodium (VOLTAREN) 1 % GEL Apply 2 g topically daily as needed (for pain). Patient taking differently: Apply 2 g topically 2 (two) times daily as needed (for pain).  11/19/14   Shanker Kristeen Mans, MD  feeding supplement, GLUCERNA SHAKE,  (GLUCERNA SHAKE) LIQD Take 237 mLs by mouth 3 (three) times daily between meals. 12/10/14   Florencia Reasons, MD  fluconazole (DIFLUCAN) 200 MG tablet Take 1 tablet (200 mg total) by mouth daily. Patient not taking: Reported on 02/20/2015 01/14/15   Geradine Girt, DO  glucagon (GLUCAGON EMERGENCY) 1 MG injection Inject 1 mg into the vein once as needed. 04/20/14   [name], MD  glucose blood (ACCU-CHEK SMARTVIEW) test strip Use to check blood sugar 4 times daily. Patient not taking: Reported on 02/16/2015 01/20/15   Renato Shin, MD  insulin aspart (NOVOLOG) 100 UNIT/ML injection Inject 5 Units into the skin 3 (three) times daily with meals. 01/14/15   Geradine Girt, DO  insulin glargine (LANTUS) 100 UNIT/ML injection Inject 0.15 mLs (15 Units total) into the skin at bedtime. 01/14/15   Geradine Girt, DO  lidocaine (XYLOCAINE) 2 % jelly Apply 1 application topically 4 (four) times daily as needed (pain).  01/24/15   Historical Provider, MD  loratadine (CLARITIN) 10 MG tablet Take 10 mg by mouth at bedtime.     Historical Provider, MD  midodrine (PROAMATINE) 5 MG tablet Take 2 tablets (10 mg total) by mouth 2 (two) times daily with a meal. Patient taking differently: Take 15 mg by mouth 3 (three) times daily with meals.  01/26/15   [name], MD  ondansetron (ZOFRAN ODT) 8 MG disintegrating tablet Take 1 tablet (8 mg total) by mouth every 8 (eight) hours as needed for nausea or vomiting. 12/29/14   [name], MD  oxyCODONE-acetaminophen (PERCOCET) 10-325 MG per tablet Take 1 tablet by mouth every 8 (eight) hours. Patient taking differently: Take 1 tablet by mouth every 8 (eight) hours as needed for pain.  01/24/15   Meredith Staggers, MD  potassium chloride SA (KLOR-CON M20) 20 MEQ tablet Take 1 tablet (20 mEq total) by mouth daily. 10/25/14   [name], MD  pregabalin (LYRICA) 100 MG capsule Take 1-2 capsules (100-200 mg total) by mouth 4 (four) times daily. Take 100  mg by mouth in the morning, take 100 mg  by mouth in the afternoon, take 100 mg by mouth in the evening and take 200 mg by mouth at bedtime. Patient taking differently: Take 100-200 mg by mouth 4 (four) times daily -  with meals and at bedtime. 100 mg TID with meals and 200 mg at bedtime. 11/29/14   Meredith Staggers, MD  pregabalin (LYRICA) 200 MG capsule Take 1 capsule (200 mg total) by mouth at bedtime. Patient not taking: Reported on 02/20/2015 12/10/14   Florencia Reasons, MD  QUEtiapine (SEROQUEL) 25 MG tablet Take 1 tablet (25 mg total) by mouth 3 (three) times daily. Patient not taking: Reported on 02/20/2015 01/14/15   Geradine Girt, DO  sodium chloride (OCEAN) 0.65 % SOLN nasal spray Place 1 spray into both nostrils as needed for congestion.    Historical Provider, MD  tigecycline 50 mg in sodium chloride 0.9 % 100 mL Inject 50 mg into the vein every 12 (twelve) hours. Patient not taking: Reported on 02/20/2015 01/14/15   Geradine Girt, DO   Allergies  Allergen Reactions  . Cefuroxime Axetil Anaphylaxis  . Ertapenem Other (See Comments)    Rash and confusion  . Morphine And Related Other (See Comments)    Changed mental status, confusion, headache, visual hallucination  . Penicillins Anaphylaxis and Other (See Comments)    ?can take amoxicillin?  . Sulfa Antibiotics Anaphylaxis, Shortness Of Breath and Other (See Comments)  . Tessalon [Benzonatate] Anaphylaxis  . Shellfish Allergy Itching and Other (See Comments)    Took benadryl to alleviate reaction  . Miripirium Rash and Other (See Comments)    Change in mental status   CBC:    Component Value Date/Time   WBC 21.3* 02/20/2015 0456   HGB 9.9* 02/20/2015 0456   HCT 30.7* 02/20/2015 0456   PLT 767* 02/20/2015 0456   MCV 81.2 02/20/2015 0456   NEUTROABS 11.6* 02/18/2015 1615   LYMPHSABS 3.0 02/18/2015 1615   MONOABS 2.3* 02/18/2015 1615   EOSABS 0.5 02/18/2015 1615   BASOSABS 0.2* 02/18/2015 1615   Comprehensive Metabolic Panel:    Component Value Date/Time   NA 130*  02/20/2015 0456   K 4.6 02/20/2015 0456   CL 109 02/20/2015 0456   CO2 15* 02/20/2015 0456   BUN 10 02/20/2015 0456   CREATININE 0.52* 02/20/2015 0456   GLUCOSE 207* 02/20/2015 0456   CALCIUM 9.1 02/20/2015 0456   AST 16 02/20/2015 0456   ALT 9* 02/20/2015 0456   ALKPHOS 105 02/20/2015 0456   BILITOT 1.1 02/20/2015 0456   PROT 5.7* 02/20/2015 0456   ALBUMIN 1.4* 02/20/2015 0456    Physical Exam: Vital Signs: BP 120/76 mmHg  Pulse 66  Temp(Src) 97.9 F (36.6 C) (Oral)  Resp 12  SpO2 100% SpO2: SpO2: 100 % O2 Device: O2 Device: Nasal Cannula O2 Flow Rate: O2 Flow Rate (L/min): 2 L/min Intake/output summary:  Intake/Output Summary (Last 24 hours) at 02/20/15 1757 Last data filed at 02/20/15 1629  Gross per 24 hour  Intake   1650 ml  Output   3500 ml  Net  -1850 ml   LBM:not known   Baseline Weight:   Most recent weight:    Exam Findings:  Pt unresponsive to voice. No non-verbal s/s of pain or respiratory distress. He has a colostomy and suprapubic catheter         Palliative Performance Scale: 10-20%            Additional  Data Reviewed: Recent Labs     02/19/15  1327  02/20/15  0456  WBC  19.0*  21.3*  HGB  10.0*  9.9*  PLT  779*  767*  NA  131*  130*  BUN  9  10  CREATININE  0.52*  0.52*     Time In: 1645 Time Out: 1800 Time Total: 75 min  Greater than 50%  of this time was spent counseling and coordinating care related to the above assessment and plan.  Signed by: Dory Horn, NP  Dory Horn, NP  02/20/2015, 5:57 PM  Please contact Palliative Medicine Team phone at [phone] for questions and concerns.

## 2015-02-20 NOTE — Significant Event (Signed)
Rapid Response Event Note  Overview: Time Called: 7579 Arrival Time: 0441 Event Type: Respiratory, Cardiac  Initial Focused Assessment: Alert and responsive. Noted respiratory distress with complaint of chest pain. Skin was warm and dry. Heart sounds sound normal with regular rate and rhythm. Lungs with rhonchi and slight wheezing. No JVD present.    Interventions: Attempted bipap but patient refused.    Event Summary: Reported that patient had sudden onset of chest pain and shortness of breath that started around 04:30. O2 sats were in the 80's. Patient was placed on a venti mask which helps sats increase to the low 90's. Patient was administered morphine 2mg  ivp and distress level decreased. Chest X-ray and ABG was ordered. After about 30 minutes, patients level of distress was reduced. Breathing continued to be slightly labored and sats increased to 97%. PCP was called to bedside and we are continuing to monitor patients condition.    at      at          [facility], Jannetta Quint

## 2015-02-20 NOTE — Consult Note (Signed)
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 Re-admitted 5/14 w/ osteo. Developed acute respiratory failure in the am 5/15. CT findings + for Bilateral Pulmonary Emboli.  PCCM was consulted to first verify the findings of Pulmonary Emboli, also comment on the treatment of PE as well as possible surgical risk from a pulmonary stand-point re- wound debridements.   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 Last hospitalized in Feb when he was treated for ischial osteo, PNA, klebsiella bacteremia and Burkhoderia UTI. He required I&D of his wounds by plastic during that admit. He was initially discharged on IV vanc and ertapenem w/ plan for 6 weeks of therapy.  He was subsequently re-admitted, antibiotics were adjusted, underwent serial debridements, and eventually sent home on wound vac w/ vanc/azactam and flagyl for abx coverage. These were all completed on 3/27. W/in 24hrs of stopping his abx he developed delirium and sepsis. Was re-admitted and treated from 3/31 to 4/8 w tygacil and fluconazole (yeast in urine and MRIshowed progression of osteo of ischium). He was once again admitted on 5/14 w/ foul odar and bloody drainage from his wounds, delirium, hperglycemia and acute on chronic hypotension. Also a new decub ulcer. He was placed on ABX, ID was consulted and supportive care was continued. On 5/15 the patient woke up reporting sudden onset shortness of breath and chest pain. He  was acutely hypoxic w/ room air sats 80s, PO2 was 53.7. CT of chest was obtained that showed Pulmonary emboli. He was started on systemic anticoagulation, apparently family was wanting a second opinion about the pulmonary emboli, and IM service was concerned about anticoagulation therapy. Therefore PCCM was consulted to first verify the findings of Pulmonary Emboli, also comment on the treatment of PE as well as possible surgical risk from a pulmonary stand-point re- wound debridements.  STUDIES:  CT chest 5/15: Acute pulmonary embolus involving bilateral lower lobes with extensive consolidation of the bilateral lower lobes which could reflect infarct MR lumbar spine: 1. Multiple sacral and ischial tuberosity decubitus ulcers. Pus extends from the depths of the right ischial tuberosity ulcer 2.5 cm laterally.2. Abnormal edema and enhancement of the right ischial tuberosity with loss of the cortical margin, consistent with osteomyelitis. 3. Abnormal edema and enhancement of the fourth sacral segment and of the remnant of the fifth sacral segment. This is immediately adjacent to a sacral decubitus ulcer. PAST MEDICAL HISTORY :   has a past medical history of GERD (gastroesophageal reflux disease); Asthma; MRSA infection; Gastroparesis; Diabetic neuropathy; Seizures; Family history of anesthesia complication; Dysrhythmia; Pneumonia; Arthritis; Fibromyalgia; Stroke (01/29/2014); Type I diabetes mellitus; and Syncope (02/16/2015).  has past surgical history that includes Tonsillectomy; Multiple extractions with alveoloplasty (N/A, 08/03/2014); TEE without cardioversion (N/A, 08/17/2014); Debridment of decubitus ulcer (N/A, 10/04/2014); Laparoscopic diverted colostomy (N/A, 10/12/2014); Insertion of suprapubic catheter (N/A, 10/12/2014); Incision and drainage of wound (N/A, 11/12/2014); Application of a-cell of back (N/A, 11/12/2014); Incision and drainage of wound (N/A, 11/18/2014); Incision and  drainage of wound (N/A,  11/25/2014); Minor application of wound vac (N/A, 11/25/2014); Incision and drainage of wound (Right, 5/0/9326); and Application of a-cell of back (N/A, 12/08/2014). Prior to Admission medications   Medication Sig Start Date End Date Taking? Authorizing Provider  acetaminophen (TYLENOL) 325 MG tablet Take 2 tablets (650 mg total) by mouth every 6 (six) hours as needed for mild pain, moderate pain, fever or headache. 01/14/15   Geradine Girt, DO  albuterol (PROVENTIL) (2.5 MG/3ML) 0.083% nebulizer solution Take 3 mLs (2.5 mg total) by nebulization every 4 (four) hours as needed for wheezing or shortness of breath. 04/27/14   [name], MD  baclofen (LIORESAL) 10 MG tablet Take 1-2 tablets (10-20 mg total) by mouth 3 (three) times daily. Takes 10mg  in morning and lunchtime and takes 20mg  at bedtime 11/29/14   Meredith Staggers, MD  ciprofloxacin (CIPRO) 500 MG tablet Take 500 mg by mouth every 12 (twelve) hours. 02/15/15   Historical Provider, MD  collagenase (SANTYL) ointment Apply 1 application topically daily. 08/30/14   [name], MD  dexlansoprazole (DEXILANT) 60 MG capsule Take 1 capsule (60 mg total) by mouth daily. 11/19/14   Shanker Kristeen Mans, MD  diclofenac sodium (VOLTAREN) 1 % GEL Apply 2 g topically daily as needed (for pain). 11/19/14   Shanker Kristeen Mans, MD  feeding supplement, GLUCERNA SHAKE, (GLUCERNA SHAKE) LIQD Take 237 mLs by mouth 3 (three) times daily between meals. 12/10/14   Florencia Reasons, MD  fentaNYL (DURAGESIC - DOSED MCG/HR) 25 MCG/HR patch Place 1 patch (25 mcg total) onto the skin every 3 (three) days. 01/24/15   Meredith Staggers, MD  fluconazole (DIFLUCAN) 200 MG tablet Take 1 tablet (200 mg total) by mouth daily. 01/14/15   Geradine Girt, DO  glucagon (GLUCAGON EMERGENCY) 1 MG injection Inject 1 mg into the vein once as needed. 04/20/14   [name], MD  glucose blood (ACCU-CHEK SMARTVIEW) test strip Use to check blood sugar 4 times daily. Patient not taking: Reported on 02/16/2015  01/20/15   Renato Shin, MD  insulin aspart (NOVOLOG) 100 UNIT/ML injection Inject 5 Units into the skin 3 (three) times daily with meals. 01/14/15   Geradine Girt, DO  insulin glargine (LANTUS) 100 UNIT/ML injection Inject 0.15 mLs (15 Units total) into the skin at bedtime. 01/14/15   Geradine Girt, DO  lidocaine (XYLOCAINE) 2 % jelly Apply 1 application topically 4 (four) times daily as needed (pain).  01/24/15   Historical Provider, MD  loratadine (CLARITIN) 10 MG tablet Take 10 mg by mouth daily.     Historical Provider, MD  midodrine (PROAMATINE) 5 MG tablet Take 2 tablets (10 mg total) by mouth 2 (two) times daily with a meal. 01/26/15   [name], MD  ondansetron (ZOFRAN ODT) 8 MG disintegrating tablet Take 1 tablet (8 mg total) by mouth every 8 (eight) hours as needed for nausea or vomiting. 12/29/14   [name], MD  oxyCODONE-acetaminophen (PERCOCET) 10-325 MG per tablet Take 1 tablet by mouth every 8 (eight) hours. Patient taking differently: Take 1 tablet by mouth every 8 (eight) hours as needed for pain.  01/24/15   Meredith Staggers, MD  potassium chloride SA (KLOR-CON M20) 20 MEQ tablet Take 1 tablet (20 mEq total) by mouth daily. 10/25/14   [name], MD  pregabalin (LYRICA) 100 MG capsule Take 1-2 capsules (100-200 mg total) by mouth 4 (four) times daily. Take 100 mg by  mouth in the morning, take 100 mg by mouth in the afternoon, take 100 mg by mouth in the evening and take 200 mg by mouth at bedtime. Patient taking differently: Take 100 mg by mouth 3 (three) times daily.  11/29/14   Meredith Staggers, MD  pregabalin (LYRICA) 200 MG capsule Take 1 capsule (200 mg total) by mouth at bedtime. 12/10/14   Florencia Reasons, MD  QUEtiapine (SEROQUEL) 25 MG tablet Take 1 tablet (25 mg total) by mouth 3 (three) times daily. 01/14/15   Geradine Girt, DO  sodium chloride (OCEAN) 0.65 % SOLN nasal spray Place 1 spray into both nostrils as needed for congestion.    Historical Provider, MD  tigecycline 50 mg  in sodium chloride 0.9 % 100 mL Inject 50 mg into the vein every 12 (twelve) hours. 01/14/15   Geradine Girt, DO   Allergies  Allergen Reactions  . Cefuroxime Axetil Anaphylaxis  . Ertapenem Other (See Comments)    Rash and confusion  . Morphine And Related Other (See Comments)    Changed mental status, confusion, headache, visual hallucination  . Penicillins Anaphylaxis and Other (See Comments)    ?can take amoxicillin?  . Sulfa Antibiotics Anaphylaxis, Shortness Of Breath and Other (See Comments)  . Tessalon [Benzonatate] Anaphylaxis  . Shellfish Allergy Itching and Other (See Comments)    Took benadryl to alleviate reaction    FAMILY HISTORY:  family history includes Asthma in an other family member; Diabetes in his father; Heart disease in his mother; Hypertension in his father and another family member; Stroke in an other family member. SOCIAL HISTORY:  reports that he quit smoking about 5 months ago. His smoking use included Cigars and Cigarettes. He has a 12 pack-year smoking history. He has never used smokeless tobacco. He reports that he does not drink alcohol or use illicit drugs.  REVIEW OF SYSTEMS:   Unable due to delirium   VITAL SIGNS: Temp:  [96.9 F (36.1 C)-99.5 F (37.5 C)] 96.9 F (36.1 C) (05/15 1141) Pulse Rate:  [65-89] 65 (05/15 1141) Resp:  [12-47] 14 (05/15 1141) BP: (88-115)/(56-75) 88/58 mmHg (05/15 1141) SpO2:  [83 %-100 %] 100 % (05/15 1141) 4 liters   PHYSICAL EXAMINATION: General:  Chronically ill appearing AAM, currently awake, confused.  Neuro:  Awake, confused, contracted. Confused. Moans, does not verbalize currently  HEENT:  [place], no JVD  Cardiovascular:  rrr Lungs:  Crackles in bases,  Abdomen:  Soft, + bowel sounds  Musculoskeletal:  Quadriplegic, dry skin tone  Skin: wet to dry dressing intact to sacral wound    Recent Labs Lab 02/19/15 0022 02/19/15 1327 02/20/15 0456  NA 129* 131* 130*  K 3.9 4.2 4.6  CL 105 108 109  CO2 16*  16* 15*  BUN 11 9 10   CREATININE 0.51* 0.52* 0.52*  GLUCOSE 222* 180* 207*    Recent Labs Lab 02/18/15 1615 02/19/15 1327 02/20/15 0456  HGB 7.2* 10.0* 9.9*  HCT 23.6* 30.9* 30.7*  WBC 17.6* 19.0* 21.3*  PLT 812* 779* 767*   Ct Angio Chest Pe W/cm &/or Wo Cm  02/20/2015   CLINICAL DATA:  Acute respiratory failure, hypoxia, shortness of breath, chest pain.  EXAM: CT ANGIOGRAPHY CHEST WITH CONTRAST  TECHNIQUE: Multidetector CT imaging of the chest was performed using the standard protocol during bolus administration of intravenous contrast. Multiplanar CT image reconstructions and MIPs were obtained to evaluate the vascular anatomy.  CONTRAST:  6mL OMNIPAQUE IOHEXOL 350 MG/ML SOLN  COMPARISON:  January 11, 2015  FINDINGS: There is acute pulmonary embolus in segmental and subsegmental pulmonary arteries of the left lower lobe. There is acute pulmonary embolus in subsegmental pulmonary arteries of the right lower lobe. There are extensive consolidation of the right lower lobe and the medial aspect of the left lower lobe. There are small bilateral pleural effusions. There are lymphadenopathy in the mediastinum. The aorta is normal. There is small pericardial effusion. The visualized upper abdominal structures are normal.  Review of the MIP images confirms the above findings.  IMPRESSION: Acute pulmonary embolus involving bilateral lower lobes with extensive consolidation of the bilateral lower lobes as described. The consolidations are most likely impart due to pulmonary infarction but underlying pneumonia is not excluded.  Critical Value/emergent results were called by telephone at the time of interpretation on 02/20/2015 at 8:33 am to patient's nurse Clarisa Fling who verbally acknowledged these results.   Electronically Signed   By: Abelardo Diesel M.D.   On: 02/20/2015 08:40   Mr Lumbar Spine Wo Contrast  02/19/2015   CLINICAL DATA:  Infected sacral decubitus ulcers. Fever. Leukocytosis.  EXAM: MRI  LUMBAR SPINE WITHOUT CONTRAST, MRI OF THE SACRUM WITH AND WITHOUT CONTRAST  TECHNIQUE: Multiplanar, multisequence MR imaging of the lumbar spine and sacrum was performed. Contrast was performed for the sacral imaging.  CONTRAST:  10 cc MultiHance  COMPARISON:  CT scan of the abdomen and pelvis dated 01/11/2015 and MRI of the pelvis dated 01/08/2015  FINDINGS: MRI of the lumbar spine:  Normal conus tip at L1-2. There is diffuse edema in the posterior paraspinal musculature and in the gluteal muscles. There is also atrophy of the paraspinal musculature. The patient is quadriplegic. The paraspinal soft tissues are otherwise normal.  The discs from T11-12 through L5-S1 are normal. There is low signal throughout the bones of the lumbar spine which may represent a red marrow reactivation. There is no evidence of spinal infection. No spinal or foraminal stenosis or facet arthritis.  MRI of the sacrum:  There is abnormal edema and enhancement of the right ischial tuberosity. There is a deep decubitus ulcer that extends to the ischial tuberosity and there is a small abscess extending to enhance cm laterally from the decubitus ulcer at the ischial tuberosity. This pus collection does communicate with the decubitus ulcer.  There is devitalized soft tissue superficial to the left ischial tuberosity at the site of another decubitus ulcer. There is also devitalized soft tissue at the site of the soft tissue ulcer overlying the sacrum. There is slight edema and enhancement of the remnant of the fifth sacral segment and of the entire fourth thick sacral segment but there is no bone destruction.  No evidence of epidural infection in the sacrum.  There is abnormal subcutaneous edema throughout the pelvis with muscle atrophy.  IMPRESSION: 1. Multiple sacral and ischial tuberosity decubitus ulcers. Pus extends from the depths of the right ischial tuberosity ulcer 2.5 cm laterally. 2. Abnormal edema and enhancement of the right ischial  tuberosity with loss of the cortical margin, consistent with osteomyelitis. 3. Abnormal edema and enhancement of the fourth sacral segment and of the remnant of the fifth sacral segment. This is immediately adjacent to a sacral decubitus ulcer. This is not definitive for osteomyelitis however.   Electronically Signed   By: Lorriane Shire M.D.   On: 02/19/2015 13:16   Mr Sacrum/si Joints W Wo Contrast  02/19/2015   CLINICAL DATA:  Infected sacral decubitus ulcers. Fever. Leukocytosis.  EXAM:  MRI LUMBAR SPINE WITHOUT CONTRAST, MRI OF THE SACRUM WITH AND WITHOUT CONTRAST  TECHNIQUE: Multiplanar, multisequence MR imaging of the lumbar spine and sacrum was performed. Contrast was performed for the sacral imaging.  CONTRAST:  10 cc MultiHance  COMPARISON:  CT scan of the abdomen and pelvis dated 01/11/2015 and MRI of the pelvis dated 01/08/2015  FINDINGS: MRI of the lumbar spine:  Normal conus tip at L1-2. There is diffuse edema in the posterior paraspinal musculature and in the gluteal muscles. There is also atrophy of the paraspinal musculature. The patient is quadriplegic. The paraspinal soft tissues are otherwise normal.  The discs from T11-12 through L5-S1 are normal. There is low signal throughout the bones of the lumbar spine which may represent a red marrow reactivation. There is no evidence of spinal infection. No spinal or foraminal stenosis or facet arthritis.  MRI of the sacrum:  There is abnormal edema and enhancement of the right ischial tuberosity. There is a deep decubitus ulcer that extends to the ischial tuberosity and there is a small abscess extending to enhance cm laterally from the decubitus ulcer at the ischial tuberosity. This pus collection does communicate with the decubitus ulcer.  There is devitalized soft tissue superficial to the left ischial tuberosity at the site of another decubitus ulcer. There is also devitalized soft tissue at the site of the soft tissue ulcer overlying the sacrum.  There is slight edema and enhancement of the remnant of the fifth sacral segment and of the entire fourth thick sacral segment but there is no bone destruction.  No evidence of epidural infection in the sacrum.  There is abnormal subcutaneous edema throughout the pelvis with muscle atrophy.  IMPRESSION: 1. Multiple sacral and ischial tuberosity decubitus ulcers. Pus extends from the depths of the right ischial tuberosity ulcer 2.5 cm laterally. 2. Abnormal edema and enhancement of the right ischial tuberosity with loss of the cortical margin, consistent with osteomyelitis. 3. Abnormal edema and enhancement of the fourth sacral segment and of the remnant of the fifth sacral segment. This is immediately adjacent to a sacral decubitus ulcer. This is not definitive for osteomyelitis however.   Electronically Signed   By: Lorriane Shire M.D.   On: 02/19/2015 13:16   Dg Chest Port 1 View  02/20/2015   CLINICAL DATA:  Patient woke up this morning with chest pain and shortness of breath.  EXAM: PORTABLE CHEST - 1 VIEW  COMPARISON:  CT chest 01/11/2015.  Chest 01/10/2015.  FINDINGS: Borderline heart size with normal pulmonary vascularity. Patchy bilateral basilar airspace disease is improving since previous study. Small bilateral pleural effusions. Right PICC catheter tip is over the cavoatrial junction. No pneumothorax. Old fracture deformity of the right clavicle.  IMPRESSION: Basilar airspace infiltrates are improving since previous study. Small bilateral pleural effusions.   Electronically Signed   By: Lucienne Capers M.D.   On: 02/20/2015 05:42    ASSESSMENT / PLAN: 1) Acute Hypoxic Respiratory Failure in the setting of Acute Bilateral Pulmonary Emboli and bilateral lower lobe consolidations c/w possible bilateral infarcts 2) pulmonary risk assessment: high surgical risk for pulmonary and cardiovascular contraindication  Plan -Get LE dopplers -Agree w/ systemic heparin as long as no evidence of worsening  bleeding; if no bleeding would be optimal to put off surgical interventions for at least 7-10 days.  - alternatively if he has bleeding or it is felt that surgical wound debridement sooner is necessary to assure control of infection then would go ahead and have  IR place IVC filter. Then surgery could be done at any point assuming he is hemodynamically stable enough to do it.  -from pulmonary risk stand-point the sooner he has surgery the higher his pulmonary risks would be. These would include: death, worsening hypoxia, neurological insult, or failure to wean from mechanical ventilator. 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  Plastics consulted. See surgical risk assessment above  Erick Colace ACNP-BC [place] Pager # [phone] OR # [phone] if no answer   Attending Note:  I have examined patient, reviewed labs, studies and notes. I have discussed the case with Jerrye Bushy, and I agree with the data and plans as amended above. I recommend attempting to continue heparin if he can tolerate. If so then best to treat for 7 days before undertaking surgical debridement. If he can't tolerate heparin right now, or if debridement takes precedence due to his infection, then I would place an IVC Filter and try to re-initiate the heparin when / if  safe to do so. He is high risk for any surgery requiring general anesthesia, but this does not preclude a surgery if the benefits outweigh the risks.   Baltazar Apo, MD, PhD 02/20/2015, 3:27 PM [facility] [phone] or if no answer [phone]

## 2015-02-20 NOTE — Progress Notes (Signed)
Patient hollering out. NT to room stating pt says he is 'short of breath.' Upon arrival to room, Patient tells RN that he is having chest pain and SOB. Sats are 83-84 on room air. [place] 4L placed with no improvement. Patient placed on NRB mask and EKG performed. RR between 35-43 and breathing is labored and accessory muscles are being used. NP notified. Rapid response team at bedside. CXR, morphine, labs and ABG ordered. NP coming to bedside.   M.Forest Gleason, RN

## 2015-02-20 NOTE — Progress Notes (Signed)
ANTIBIOTIC CONSULT NOTE - FOLLOW UP  Pharmacy Consult for Vancomycin Indication: Osteomyelitis  Allergies  Allergen Reactions  . Cefuroxime Axetil Anaphylaxis  . Ertapenem Other (See Comments)    Rash and confusion  . Morphine And Related Other (See Comments)    Changed mental status, confusion, headache, visual hallucination  . Penicillins Anaphylaxis and Other (See Comments)    ?can take amoxicillin?  . Sulfa Antibiotics Anaphylaxis, Shortness Of Breath and Other (See Comments)  . Tessalon [Benzonatate] Anaphylaxis  . Shellfish Allergy Itching and Other (See Comments)    Took benadryl to alleviate reaction    Patient Measurements:   Adjusted Body Weight:   Vital Signs: Temp: 97 F (36.1 C) (05/15 0819) Temp Source: Axillary (05/15 0819) BP: 95/65 mmHg (05/15 0819) Pulse Rate: 73 (05/15 0819) Intake/Output from previous day: 05/14 0701 - 05/15 0700 In: 1650 [I.V.:1650] Out: 1850 [Urine:1200; Stool:650] Intake/Output from this shift:    Labs:  Recent Labs  02/18/15 1615  02/19/15 0022 02/19/15 1327 02/20/15 0456  WBC 17.6*  --   --  19.0* 21.3*  HGB 7.2*  --   --  10.0* 9.9*  PLT 812*  --   --  779* 767*  CREATININE 0.71  < > 0.51* 0.52* 0.52*  < > = values in this interval not displayed. Estimated Creatinine Clearance: 97 mL/min (by C-G formula based on Cr of 0.52). No results for input(s): VANCOTROUGH, VANCOPEAK, VANCORANDOM, GENTTROUGH, GENTPEAK, GENTRANDOM, TOBRATROUGH, TOBRAPEAK, TOBRARND, AMIKACINPEAK, AMIKACINTROU, AMIKACIN in the last 72 hours.   Microbiology: Recent Results (from the past 720 hour(s))  Urine culture     Status: None   Collection Time: 01/31/15  3:26 PM  Result Value Ref Range Status   Specimen Description URINE, CLEAN CATCH  Final   Special Requests NONE  Final   Colony Count   Final    >=100,000 COLONIES/ML Performed at [facility]    Culture   Final    KLEBSIELLA PNEUMONIAE Performed at [facility]    Report Status 02/03/2015 FINAL  Final   Organism ID, Bacteria KLEBSIELLA PNEUMONIAE  Final      Susceptibility   Klebsiella pneumoniae - MIC*    AMPICILLIN RESISTANT      CEFAZOLIN <=4 SENSITIVE Sensitive     CEFTRIAXONE <=1 SENSITIVE Sensitive     CIPROFLOXACIN <=0.25 SENSITIVE Sensitive     GENTAMICIN <=1 SENSITIVE Sensitive     LEVOFLOXACIN <=0.12 SENSITIVE Sensitive     NITROFURANTOIN <=16 SENSITIVE Sensitive     TOBRAMYCIN <=1 SENSITIVE Sensitive     TRIMETH/SULFA <=20 SENSITIVE Sensitive     PIP/TAZO <=4 SENSITIVE Sensitive     * KLEBSIELLA PNEUMONIAE  MRSA PCR Screening     Status: None   Collection Time: 02/18/15  8:33 PM  Result Value Ref Range Status   MRSA by PCR NEGATIVE NEGATIVE Final    Comment:        The GeneXpert MRSA Assay (FDA approved for NASAL specimens only), is one component of a comprehensive MRSA colonization surveillance program. It is not intended to diagnose MRSA infection nor to guide or monitor treatment for MRSA infections.     Anti-infectives    Start     Dose/Rate Route Frequency Ordered Stop   02/20/15 1400  aztreonam (AZACTAM) 2 g in dextrose 5 % 50 mL IVPB     2 g 100 mL/hr over 30 Minutes Intravenous 3 times per day 02/19/15 1417     02/20/15 0800  metroNIDAZOLE (FLAGYL) IVPB  500 mg     500 mg 100 mL/hr over 60 Minutes Intravenous Every 8 hours 02/19/15 1417     02/20/15 0200  tigecycline (TYGACIL) 50 mg in sodium chloride 0.9 % 100 mL IVPB  Status:  Discontinued     50 mg 200 mL/hr over 30 Minutes Intravenous Every 12 hours 02/19/15 1323 02/19/15 1417   02/19/15 1400  tigecycline (TYGACIL) 100 mg in sodium chloride 0.9 % 100 mL IVPB     100 mg 200 mL/hr over 30 Minutes Intravenous  Once 02/19/15 1323 02/19/15 1526   02/19/15 0800  vancomycin (VANCOCIN) 500 mg in sodium chloride 0.9 % 100 mL IVPB  Status:  Discontinued     500 mg 100 mL/hr over 60 Minutes Intravenous Every 12 hours 02/18/15 1804 02/19/15 1323   02/19/15 0400   aztreonam (AZACTAM) 1 g in dextrose 5 % 50 mL IVPB  Status:  Discontinued     1 g 100 mL/hr over 30 Minutes Intravenous Every 8 hours 02/18/15 1804 02/18/15 2026   02/18/15 2200  aztreonam (AZACTAM) 2 g in dextrose 5 % 50 mL IVPB  Status:  Discontinued     2 g 100 mL/hr over 30 Minutes Intravenous 3 times per day 02/18/15 1745 02/18/15 1745   02/18/15 1800  vancomycin (VANCOCIN) IVPB 1000 mg/200 mL premix     1,000 mg 200 mL/hr over 60 Minutes Intravenous  Once 02/18/15 1745 02/18/15 1937   02/18/15 1800  aztreonam (AZACTAM) 2 g in dextrose 5 % 50 mL IVPB  Status:  Discontinued     2 g 100 mL/hr over 30 Minutes Intravenous  Once 02/18/15 1746 02/18/15 2026      Assessment: 42 yoM with history of sacral decub ulcer and quadriplegia presents with leukocytosis after follow-up visit with VanDam. Notes bloody drainage that is also yellow and green from ulcer.  MRI of spine cannot rule-out osteomyelitis.  WBC continue to trend up to 21.3, afebrile.  Pharmacy consulted to dose vancomycin for osteomyelitis.  Vanc 5/13>>5/14, Restart 5/15>> Aztreo 5/13>>5/13 LVQ 5/13 >> 5/14 Tigecycline 5/14 X 1 Aztreonam 5/15>> Metronidazole 5/15>>  5/13 Bld Cx2>>NGTD  Looking back at patient's records, it appears that he has been managed on Vancomycin 500 mg IV q12h in the past with a therapeutic VT.  It is likely that since he is a quadriplegia, his SCr is not reflective of his actual renal function.  Since it has been since 5/13 PM for last dose of Vanc, I will re-load patient and start maintenance dose.  Goal of Therapy:  Vancomycin trough level 15-20 mcg/ml  Plan:  - Vancomycin 1 g IV X 1, followed by 500 mg IV q12h - Aztreonam 2 g IV q8h - Metronidazole 500 mg IV qh8 - Monitor renal function, cultures, and check VT when indicated.  Theron Arista, PharmD Clinical Pharmacist - Resident Pager: [phone] 5/15/201610:15 AM

## 2015-02-20 NOTE — Progress Notes (Signed)
INFECTIOUS DISEASE PROGRESS NOTE  ID: Jason [name] is a 29 y.o. male with  Active Problems:   Osteomyelitis   Anemia   DKA (diabetic ketoacidoses)   Infected wound  Subjective: Resting quietly  Abtx:  Anti-infectives    Start     Dose/Rate Route Frequency Ordered Stop   02/20/15 1400  aztreonam (AZACTAM) 2 g in dextrose 5 % 50 mL IVPB     2 g 100 mL/hr over 30 Minutes Intravenous 3 times per day 02/19/15 1417     02/20/15 0800  metroNIDAZOLE (FLAGYL) IVPB 500 mg     500 mg 100 mL/hr over 60 Minutes Intravenous Every 8 hours 02/19/15 1417     02/20/15 0200  tigecycline (TYGACIL) 50 mg in sodium chloride 0.9 % 100 mL IVPB  Status:  Discontinued     50 mg 200 mL/hr over 30 Minutes Intravenous Every 12 hours 02/19/15 1323 02/19/15 1417   02/19/15 1400  tigecycline (TYGACIL) 100 mg in sodium chloride 0.9 % 100 mL IVPB     100 mg 200 mL/hr over 30 Minutes Intravenous  Once 02/19/15 1323 02/19/15 1526   02/19/15 0800  vancomycin (VANCOCIN) 500 mg in sodium chloride 0.9 % 100 mL IVPB  Status:  Discontinued     500 mg 100 mL/hr over 60 Minutes Intravenous Every 12 hours 02/18/15 1804 02/19/15 1323   02/19/15 0400  aztreonam (AZACTAM) 1 g in dextrose 5 % 50 mL IVPB  Status:  Discontinued     1 g 100 mL/hr over 30 Minutes Intravenous Every 8 hours 02/18/15 1804 02/18/15 2026   02/18/15 2200  aztreonam (AZACTAM) 2 g in dextrose 5 % 50 mL IVPB  Status:  Discontinued     2 g 100 mL/hr over 30 Minutes Intravenous 3 times per day 02/18/15 1745 02/18/15 1745   02/18/15 1800  vancomycin (VANCOCIN) IVPB 1000 mg/200 mL premix     1,000 mg 200 mL/hr over 60 Minutes Intravenous  Once 02/18/15 1745 02/18/15 1937   02/18/15 1800  aztreonam (AZACTAM) 2 g in dextrose 5 % 50 mL IVPB  Status:  Discontinued     2 g 100 mL/hr over 30 Minutes Intravenous  Once 02/18/15 1746 02/18/15 2026      Medications:  Scheduled: . aztreonam  2 g Intravenous 3 times per day  . baclofen  10 mg Oral BID  WC  . baclofen  20 mg Oral QHS  . feeding supplement (GLUCERNA SHAKE)  237 mL Oral TID BM  . fentaNYL  25 mcg Transdermal Q72H  . insulin aspart  0-9 Units Subcutaneous TID WC  . insulin glargine  30 Units Subcutaneous Daily  . loratadine  10 mg Oral Daily  . magnesium sulfate 1 - 4 g bolus IVPB  4 g Intravenous Once  . metronidazole  500 mg Intravenous Q8H  . midodrine  10 mg Oral TID WC  . pantoprazole  40 mg Oral Daily  . potassium chloride SA  20 mEq Oral Daily  . pregabalin  100 mg Oral BID  . pregabalin  200 mg Oral QHS  . sodium chloride  10-40 mL Intracatheter Q12H    Objective: Vital signs in last 24 hours: Temp:  [97 F (36.1 C)-99.5 F (37.5 C)] 97 F (36.1 C) (05/15 0819) Pulse Rate:  [71-89] 73 (05/15 0819) Resp:  [11-47] 15 (05/15 0819) BP: (91-115)/(56-75) 95/65 mmHg (05/15 0819) SpO2:  [83 %-100 %] 100 % (05/15 0819)   General appearance: fatigued and dificult  exam due to multiple sounds in room Resp: clear to auscultation bilaterally Cardio: regular rate and rhythm GI: normal findings: bowel sounds normal and soft, non-tender  Lab Results  Recent Labs  02/19/15 1327 02/20/15 0456  WBC 19.0* 21.3*  HGB 10.0* 9.9*  HCT 30.9* 30.7*  NA 131* 130*  K 4.2 4.6  CL 108 109  CO2 16* 15*  BUN 9 10  CREATININE 0.52* 0.52*   Liver Panel  Recent Labs  02/18/15 1615 02/20/15 0456  PROT 7.1 5.7*  ALBUMIN 2.0* 1.4*  AST 9* 16  ALT 12* 9*  ALKPHOS 114 105  BILITOT 0.6 1.1   Sedimentation Rate No results for input(s): ESRSEDRATE in the last 72 hours. C-Reactive Protein No results for input(s): CRP in the last 72 hours.  Microbiology: Recent Results (from the past 240 hour(s))  MRSA PCR Screening     Status: None   Collection Time: 02/18/15  8:33 PM  Result Value Ref Range Status   MRSA by PCR NEGATIVE NEGATIVE Final    Comment:        The GeneXpert MRSA Assay (FDA approved for NASAL specimens only), is one component of a comprehensive MRSA  colonization surveillance program. It is not intended to diagnose MRSA infection nor to guide or monitor treatment for MRSA infections.     Studies/Results: Ct Angio Chest Pe W/cm &/or Wo Cm  02/20/2015   CLINICAL DATA:  Acute respiratory failure, hypoxia, shortness of breath, chest pain.  EXAM: CT ANGIOGRAPHY CHEST WITH CONTRAST  TECHNIQUE: Multidetector CT imaging of the chest was performed using the standard protocol during bolus administration of intravenous contrast. Multiplanar CT image reconstructions and MIPs were obtained to evaluate the vascular anatomy.  CONTRAST:  54mL OMNIPAQUE IOHEXOL 350 MG/ML SOLN  COMPARISON:  January 11, 2015  FINDINGS: There is acute pulmonary embolus in segmental and subsegmental pulmonary arteries of the left lower lobe. There is acute pulmonary embolus in subsegmental pulmonary arteries of the right lower lobe. There are extensive consolidation of the right lower lobe and the medial aspect of the left lower lobe. There are small bilateral pleural effusions. There are lymphadenopathy in the mediastinum. The aorta is normal. There is small pericardial effusion. The visualized upper abdominal structures are normal.  Review of the MIP images confirms the above findings.  IMPRESSION: Acute pulmonary embolus involving bilateral lower lobes with extensive consolidation of the bilateral lower lobes as described. The consolidations are most likely impart due to pulmonary infarction but underlying pneumonia is not excluded.  Critical Value/emergent results were called by telephone at the time of interpretation on 02/20/2015 at 8:33 am to patient's nurse Clarisa Fling who verbally acknowledged these results.   Electronically Signed   By: Abelardo Diesel M.D.   On: 02/20/2015 08:40   Mr Lumbar Spine Wo Contrast  02/19/2015   CLINICAL DATA:  Infected sacral decubitus ulcers. Fever. Leukocytosis.  EXAM: MRI LUMBAR SPINE WITHOUT CONTRAST, MRI OF THE SACRUM WITH AND WITHOUT  CONTRAST  TECHNIQUE: Multiplanar, multisequence MR imaging of the lumbar spine and sacrum was performed. Contrast was performed for the sacral imaging.  CONTRAST:  10 cc MultiHance  COMPARISON:  CT scan of the abdomen and pelvis dated 01/11/2015 and MRI of the pelvis dated 01/08/2015  FINDINGS: MRI of the lumbar spine:  Normal conus tip at L1-2. There is diffuse edema in the posterior paraspinal musculature and in the gluteal muscles. There is also atrophy of the paraspinal musculature. The patient is quadriplegic. The paraspinal soft  tissues are otherwise normal.  The discs from T11-12 through L5-S1 are normal. There is low signal throughout the bones of the lumbar spine which may represent a red marrow reactivation. There is no evidence of spinal infection. No spinal or foraminal stenosis or facet arthritis.  MRI of the sacrum:  There is abnormal edema and enhancement of the right ischial tuberosity. There is a deep decubitus ulcer that extends to the ischial tuberosity and there is a small abscess extending to enhance cm laterally from the decubitus ulcer at the ischial tuberosity. This pus collection does communicate with the decubitus ulcer.  There is devitalized soft tissue superficial to the left ischial tuberosity at the site of another decubitus ulcer. There is also devitalized soft tissue at the site of the soft tissue ulcer overlying the sacrum. There is slight edema and enhancement of the remnant of the fifth sacral segment and of the entire fourth thick sacral segment but there is no bone destruction.  No evidence of epidural infection in the sacrum.  There is abnormal subcutaneous edema throughout the pelvis with muscle atrophy.  IMPRESSION: 1. Multiple sacral and ischial tuberosity decubitus ulcers. Pus extends from the depths of the right ischial tuberosity ulcer 2.5 cm laterally. 2. Abnormal edema and enhancement of the right ischial tuberosity with loss of the cortical margin, consistent with  osteomyelitis. 3. Abnormal edema and enhancement of the fourth sacral segment and of the remnant of the fifth sacral segment. This is immediately adjacent to a sacral decubitus ulcer. This is not definitive for osteomyelitis however.   Electronically Signed   By: Lorriane Shire M.D.   On: 02/19/2015 13:16   Mr Sacrum/si Joints W Wo Contrast  02/19/2015   CLINICAL DATA:  Infected sacral decubitus ulcers. Fever. Leukocytosis.  EXAM: MRI LUMBAR SPINE WITHOUT CONTRAST, MRI OF THE SACRUM WITH AND WITHOUT CONTRAST  TECHNIQUE: Multiplanar, multisequence MR imaging of the lumbar spine and sacrum was performed. Contrast was performed for the sacral imaging.  CONTRAST:  10 cc MultiHance  COMPARISON:  CT scan of the abdomen and pelvis dated 01/11/2015 and MRI of the pelvis dated 01/08/2015  FINDINGS: MRI of the lumbar spine:  Normal conus tip at L1-2. There is diffuse edema in the posterior paraspinal musculature and in the gluteal muscles. There is also atrophy of the paraspinal musculature. The patient is quadriplegic. The paraspinal soft tissues are otherwise normal.  The discs from T11-12 through L5-S1 are normal. There is low signal throughout the bones of the lumbar spine which may represent a red marrow reactivation. There is no evidence of spinal infection. No spinal or foraminal stenosis or facet arthritis.  MRI of the sacrum:  There is abnormal edema and enhancement of the right ischial tuberosity. There is a deep decubitus ulcer that extends to the ischial tuberosity and there is a small abscess extending to enhance cm laterally from the decubitus ulcer at the ischial tuberosity. This pus collection does communicate with the decubitus ulcer.  There is devitalized soft tissue superficial to the left ischial tuberosity at the site of another decubitus ulcer. There is also devitalized soft tissue at the site of the soft tissue ulcer overlying the sacrum. There is slight edema and enhancement of the remnant of the  fifth sacral segment and of the entire fourth thick sacral segment but there is no bone destruction.  No evidence of epidural infection in the sacrum.  There is abnormal subcutaneous edema throughout the pelvis with muscle atrophy.  IMPRESSION: 1. Multiple  sacral and ischial tuberosity decubitus ulcers. Pus extends from the depths of the right ischial tuberosity ulcer 2.5 cm laterally. 2. Abnormal edema and enhancement of the right ischial tuberosity with loss of the cortical margin, consistent with osteomyelitis. 3. Abnormal edema and enhancement of the fourth sacral segment and of the remnant of the fifth sacral segment. This is immediately adjacent to a sacral decubitus ulcer. This is not definitive for osteomyelitis however.   Electronically Signed   By: Lorriane Shire M.D.   On: 02/19/2015 13:16   Dg Chest Port 1 View  02/20/2015   CLINICAL DATA:  Patient woke up this morning with chest pain and shortness of breath.  EXAM: PORTABLE CHEST - 1 VIEW  COMPARISON:  CT chest 01/11/2015.  Chest 01/10/2015.  FINDINGS: Borderline heart size with normal pulmonary vascularity. Patchy bilateral basilar airspace disease is improving since previous study. Small bilateral pleural effusions. Right PICC catheter tip is over the cavoatrial junction. No pneumothorax. Old fracture deformity of the right clavicle.  IMPRESSION: Basilar airspace infiltrates are improving since previous study. Small bilateral pleural effusions.   Electronically Signed   By: Lucienne Capers M.D.   On: 02/20/2015 05:42     Assessment/Plan: Acute B PE Fever Decubitus Osteomyelitis DM1, uncontrolled, mild DKA Severe Protein Calorie Malnutrition Hyponaremia  Total days of antibiotics: 2 vanco/aztreonam/flagyl  Now on heparin Will resume vanco Wound care Await BCx Nutrition plasitics         [name] Infectious Diseases (pager) [phone] [email or web address] 02/20/2015, 9:53 AM  LOS: 2 days

## 2015-02-20 NOTE — Progress Notes (Signed)
Shift event note:  Notified by RN that pt awoke w/ c/o sudden onset of CP and SOB. Pt initially noted w/ 02 sats in the 80's on r/a and was placed on VM which improved his 02 sats. RR RN was paged and responded to bedside. Stat ABG, and PCXR requested. At bedside pt noted w/ RR in the 20's as RN reports his respiratory effort has improved. 02 sats 100% on NRB. Pt is somewhat drowsy after receiving Morphine 2 mg IV but will awaken and responds appropriately. He denies CP at this time and will offer few details regarding his symptoms. 12-lead EKG normal.  BBS somewhat diminished w/ scattered rhonchi L>R.  Initial ABG reveals p02 of 52.5 but there was some question regarding ABG being venous vs mixed. Repeat ABG reveals pH-7.32, pC02-30.6, p02-53.7 and bicarb of 15.4. CXR w/o acute process in fact improved from prior study. After 20-30 min of observation pt noted resting w/ eyes closed RR-22 and 02 sats 94-96 on r/a. Pt placed on [place] at 2L. Pt afebrile per most recent temp. BP-98/61, P-70-80's. CBC, Bmet and troponin pending. Assessment/Plan: 1. Acute respiratory failure w/ hypoxia: Etiology unclear. CXR w/o acute changes. Given hypoxia will obtain CTA to r/o PE.  2. Chest pain:  Etiology unclear. Resolved. EKG w/o acute changes. Associated w/ sudden onset of SOB/hypoxia. Will cycle CE's. Follow pending labs. Discussed pt w/ Dr Posey Pronto who also reviewed EKG and is in agreement w/ plan. Will continue to monitor closely on SDU.   Jeryl Columbia, NP-C [facility] Pager [phone]

## 2015-02-21 DIAGNOSIS — I2699 Other pulmonary embolism without acute cor pulmonale: Secondary | ICD-10-CM | POA: Insufficient documentation

## 2015-02-21 LAB — MAGNESIUM: Magnesium: 1.9 mg/dL (ref 1.7–2.4)

## 2015-02-21 LAB — PROTEIN ELECTROPHORESIS, SERUM
A/G Ratio: 0.5 — ABNORMAL LOW (ref 0.7–2.0)
ALPHA-1-GLOBULIN: 0.4 g/dL (ref 0.1–0.4)
ALPHA-2-GLOBULIN: 0.9 g/dL (ref 0.4–1.2)
Albumin ELP: 1.9 g/dL — ABNORMAL LOW (ref 3.2–5.6)
Beta Globulin: 1.1 g/dL (ref 0.6–1.3)
GAMMA GLOBULIN: 1.7 g/dL — AB (ref 0.5–1.6)
Globulin, Total: 4 g/dL (ref 2.0–4.5)
Total Protein ELP: 5.9 g/dL — ABNORMAL LOW (ref 6.0–8.5)

## 2015-02-21 LAB — GLUCOSE, CAPILLARY
GLUCOSE-CAPILLARY: 116 mg/dL — AB (ref 65–99)
GLUCOSE-CAPILLARY: 67 mg/dL (ref 65–99)
GLUCOSE-CAPILLARY: 110 mg/dL — AB (ref 65–99)
GLUCOSE-CAPILLARY: 165 mg/dL — AB (ref 65–99)
Glucose-Capillary: 129 mg/dL — ABNORMAL HIGH (ref 65–99)
Glucose-Capillary: 142 mg/dL — ABNORMAL HIGH (ref 65–99)
Glucose-Capillary: 180 mg/dL — ABNORMAL HIGH (ref 65–99)
Glucose-Capillary: 33 mg/dL — CL (ref 65–99)
Glucose-Capillary: 44 mg/dL — CL (ref 65–99)

## 2015-02-21 LAB — COMPREHENSIVE METABOLIC PANEL
ALK PHOS: 94 U/L (ref 38–126)
ALT: 8 U/L — ABNORMAL LOW (ref 17–63)
AST: 9 U/L — AB (ref 15–41)
Albumin: 1.5 g/dL — ABNORMAL LOW (ref 3.5–5.0)
Anion gap: 5 (ref 5–15)
BILIRUBIN TOTAL: 0.1 mg/dL — AB (ref 0.3–1.2)
BUN: 7 mg/dL (ref 6–20)
CHLORIDE: 116 mmol/L — AB (ref 101–111)
CO2: 16 mmol/L — AB (ref 22–32)
CREATININE: 0.41 mg/dL — AB (ref 0.61–1.24)
Calcium: 9.5 mg/dL (ref 8.9–10.3)
GFR calc Af Amer: >60 mL/min (ref >60)
Glucose, Bld: 70 mg/dL (ref 65–99)
POTASSIUM: 3.9 mmol/L (ref 3.5–5.1)
SODIUM: 137 mmol/L (ref 135–145)
Total Protein: 6.2 g/dL — ABNORMAL LOW (ref 6.5–8.1)

## 2015-02-21 LAB — HEPARIN LEVEL (UNFRACTIONATED)
HEPARIN UNFRACTIONATED: 0.14 [IU]/mL — AB (ref 0.30–0.70)
Heparin Unfractionated: 0.33 IU/mL (ref 0.30–0.70)
Heparin Unfractionated: 0.39 IU/mL (ref 0.30–0.70)

## 2015-02-21 LAB — CBC
HCT: 32.1 % — ABNORMAL LOW (ref 39.0–52.0)
HEMOGLOBIN: 10.4 g/dL — AB (ref 13.0–17.0)
MCH: 26.1 pg (ref 26.0–34.0)
MCHC: 32.4 g/dL (ref 30.0–36.0)
MCV: 80.5 fL (ref 78.0–100.0)
PLATELETS: 891 10*3/uL — AB (ref 150–400)
RBC: 3.99 MIL/uL — ABNORMAL LOW (ref 4.22–5.81)
RDW: 14.9 % (ref 11.5–15.5)
WBC: 18 10*3/uL — ABNORMAL HIGH (ref 4.0–10.5)

## 2015-02-21 LAB — C-REACTIVE PROTEIN: CRP: 11.3 mg/dL — ABNORMAL HIGH (ref <1.0)

## 2015-02-21 LAB — VITAMIN D 25 HYDROXY (VIT D DEFICIENCY, FRACTURES): Vit D, 25-Hydroxy: 7.7 ng/mL — ABNORMAL LOW (ref 30.0–100.0)

## 2015-02-21 LAB — PREALBUMIN: Prealbumin: 3.7 mg/dL — ABNORMAL LOW (ref 18–38)

## 2015-02-21 LAB — SEDIMENTATION RATE: SED RATE: 60 mm/h — AB (ref 0–16)

## 2015-02-21 MED ORDER — ENSURE ENLIVE PO LIQD
237.0000 mL | Freq: Two times a day (BID) | ORAL | Status: DC
  Administered 2015-02-21 – 2015-02-27 (×8): 237 mL via ORAL

## 2015-02-21 MED ORDER — INSULIN GLARGINE 100 UNIT/ML ~~LOC~~ SOLN
10.0000 [IU] | Freq: Every day | SUBCUTANEOUS | Status: DC
  Administered 2015-02-22 – 2015-02-24 (×3): 10 [IU] via SUBCUTANEOUS
  Filled 2015-02-21 (×3): qty 0.1

## 2015-02-21 MED ORDER — DEXTROSE-NACL 5-0.9 % IV SOLN
INTRAVENOUS | Status: DC
  Administered 2015-02-21 – 2015-02-24 (×6): via INTRAVENOUS

## 2015-02-21 MED ORDER — DEXTROSE 50 % IV SOLN
INTRAVENOUS | Status: AC
  Administered 2015-02-21: 50 mL
  Filled 2015-02-21: qty 50

## 2015-02-21 MED ORDER — DEXTROSE 10 % IV SOLN: INTRAVENOUS | Status: DC

## 2015-02-21 MED ORDER — DEXTROSE 50 % IV SOLN
50.0000 mL | Freq: Once | INTRAVENOUS | Status: AC
  Administered 2015-02-21: 50 mL via INTRAVENOUS

## 2015-02-21 MED ORDER — DEXTROSE 50 % IV SOLN
INTRAVENOUS | Status: AC
  Administered 2015-02-21: 50 mL via INTRAVENOUS
  Filled 2015-02-21: qty 50

## 2015-02-21 MED ORDER — INSULIN GLARGINE 100 UNIT/ML ~~LOC~~ SOLN
20.0000 [IU] | Freq: Every day | SUBCUTANEOUS | Status: DC
Start: 2015-02-21 — End: 2015-02-21
  Filled 2015-02-21 (×2): qty 0.2

## 2015-02-21 MED ORDER — METOCLOPRAMIDE HCL 5 MG PO TABS
5.0000 mg | ORAL_TABLET | Freq: Every day | ORAL | Status: DC
  Administered 2015-02-21 – 2015-03-06 (×14): 5 mg via ORAL
  Filled 2015-02-21 (×14): qty 1

## 2015-02-21 NOTE — Progress Notes (Signed)
No distress No new complaints  Filed Vitals:   02/20/15 2300 02/21/15 0400 02/21/15 0750 02/21/15 1213  BP: 105/71 92/61 92/69  95/63  Pulse: 62 65 65 64  Temp: 98.3 F (36.8 C) 98 F (36.7 C) 97 F (36.1 C) 96.8 F (36 C)  TempSrc: Axillary Axillary Axillary Axillary  Resp: 15 14 14 16   Height:      Weight:      SpO2: 100% 100% 100% 100%   RASS 0 NAD Chest clear anteriorly RRR abd soft Ext warm  I have reviewed all of today's lab results. Relevant abnormalities are discussed in the A/P section   IMPRESSION: Acute hypoxic resp failure Acute PE Severe sacral pressure ulcers with osteomyelitis  PLAN/REC: Cont heparin Transition to long term anticoagulation after certain no further surgical debridement is planned  Either warfarin or a NOAC would be acceptable choices Given that his RFs (immobilization, chronic inflammation) are probably lifelong, recommend lifelong anticoagulation Rest of mgmt per primary service  PCCM will sign off. Please call if we can be of further assistance  Merton Border, MD ; [facility] [phone].  After 5:30 PM or weekends, call [phone]

## 2015-02-21 NOTE — Consult Note (Addendum)
WOC wound consult note Reason for Consult: Pt is familiar to [facility] team from multiple previous admissions. Refer to recent progress note on 4/1. Mother at bedside is well informed regarding topical treatment and wound appearance.Pt has been followed in the past by Dr Migdalia Dk of the Plastics team and was due for an appointment at the outpatient wound care center today.  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 Bone palpable with swab. 20% slough, 80% red Mod amt tan drainage, strong odor.  Right ischium with chronic stage 4 wound; 10X4X2cm, undermining to 2 cm, bone palpable, no odor, mod amt yellow drainage, 95% red, 5% yellow. Left ischium with unstageable wound; 6X5cm, 100% loose slough, fluctuant when probed and tunnel is visible when clipped with scissors.  Small amt tan drainage, strong odor. Left heel stage 2 wound;  4X4.5X.1cm, pink and moist with small amt yellow drainage, no odor. Left outer foot near small toe with full thickness wound; 2X1X.1cm, pink and moist, small amt yellow drainage, no odor Left anterior 4th toe full thickness wound; 4X1X.1cm pink and moist, small amt yellow drainage, no odor Middle back with full thickness wound; 12X14X.1cm, red and moist, mod amt yellow drainage, no odor. Pressure Ulcer POA: Yes  Pt has a colostomy pouch intact with good seal to LLQ. Stoma red and viable when visualized through pouch, small amt liquid brown stool. Pt denies any problems with ostomy and mother denies need for assistance with pouching activities. Supplies at bedside for patient and family use during hospital stay.  Continue present plan of care with vaseline gauze to middle back wound and to left heel and toe wounds to promote moist healing. Moist gauze packing to right ischium and sacrum. PT could benefit from surgical debridement of left ischium; please consult Dr Migdalia Dk of the plastics team for further plan of care.Moist gauze packing to  left  ischium until further input is available. Prevalon boots are in place to reduce pressure to bilat heels and pt is on an air mattress replacement to reduce pressure. Discussed plan of care with mother and patient and they deny further questions at this time. One hour spent performing this consult. Please re-consult if further assistance is needed.  Thank-you,  Julien Girt MSN, [place], [place], [place], [place]

## 2015-02-21 NOTE — Care Management Note (Signed)
Case Management Note  Patient Details  Name: Jason [name] MRN: [id number] Date of Birth: [birth year]  Subjective/Objective:                Wound infection   Action/Plan: Lives at home with mother, [name], # [phone]   Expected Discharge Date:                  Expected Discharge Plan:  [place]  In-House Referral:     Discharge planning Services  CM Consult  Post Acute Care Choice:  [place], Resumption of Svcs/PTA Provider Choice offered to:  Parent  DME Arranged:    DME Agency:     HH Arranged:  RN [name]:  [facility]  Status of Service:     Medicare Important Message Given:  No Date Medicare IM Given:    Medicare IM give by:    Date Additional Medicare IM Given:    Additional Medicare Important Message give by:     If discussed at [place] of Stay Meetings, dates discussed:    Additional Comments: NCM spoke to mother, [name] and pt is active with [place] for [facility]. Contacted [name] to make aware of IP admission. Waiting final recommendations for home.    [name], RN 02/21/2015, 3:19 PM

## 2015-02-21 NOTE — Progress Notes (Signed)
Initial Nutrition Assessment  DOCUMENTATION CODES:  Severe malnutrition in context of chronic illness, Underweight   Pt meets criteria for SEVERE MALNUTRITION in the context of chronic illness as evidenced by 13% weight loss in 1 month and severe fat and muscle mass loss.  INTERVENTION:  Ensure Enlive (each supplement provides 350kcal and 20 grams of protein), Glucerna shake   Encourage adequate PO intake.  NUTRITION DIAGNOSIS:  Malnutrition related to chronic illness as evidenced by severe depletion of body fat, severe depletion of muscle mass, percent weight loss.  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 Pt with SOB with CT angio showing bilateral PE in lower lobes.   Mother at bedside. Pt with AMS during time of visit. Mom reports pt has been having a varied appetite over the past 1 month. Pt usually drinks Glucerna Shake 3-4 times a day. Mom reports pt has had weight loss with usual body weight of 140 lbs. Noted, pt with a 13% weight loss in 1 month. No percent meal completion recorded, however mom reports appetite has been poor since admission. Pt currently has Glucerna Shake ordered, however per mom report, vanilla flavor is unavailable thus pt has been refusing. Mom would like vanilla Ensure ordered instead. RD to modify orders. Mom was educated when and if pt is not eating food at home to increase Glucerna shakes to at least ~7 times a day to aid in adequate nutritional needs as pt is always willing to consume supplements.   Pt with severe fat and muscle mass loss.  Labs: Low CO2, creatinine, AST, ALT, total bilirubin. High chloride.  Height:  Ht Readings from Last 1 Encounters:  02/20/15 5\' 8"  (1.727 m)     Weight:  Wt Readings from Last 1 Encounters:  02/20/15 111 lb 15.9 oz (50.8 kg)    Ideal Body Weight:  70 kg  Wt Readings from Last 10 Encounters:  02/20/15 111 lb 15.9 oz (50.8 kg)  02/16/15 112 lb (50.803 kg)  01/12/15 128 lb 4.9 oz (58.2 kg)  12/19/14 130 lb 1.1 oz (59 kg)  11/25/14 155 lb (70.308 kg)  11/19/14 155 lb 13.8 oz (70.7 kg)  10/02/14 125 lb 10.6 oz (57 kg)  09/23/14 131 lb 1.6 oz (59.467 kg)  08/13/14 129 lb 6.6 oz (58.7 kg)  08/03/14 139 lb (63.05 kg)    BMI:  Body mass index is 17.03 kg/(m^2). 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

## 2015-02-21 NOTE — Progress Notes (Signed)
Hypoglycemic Event  CBG: 44  Treatment: D50 IV 50 mL  Symptoms: None  Follow-up CBG: Time:0110 CBG Result:129  Possible Reasons for Event: Inadequate meal intake  Comments/MD notified:Kirby NP notified    Jason [name]  Remember to initiate Hypoglycemia Order Set & complete

## 2015-02-21 NOTE — Progress Notes (Signed)
INFECTIOUS DISEASE PROGRESS NOTE  ID: Jason [name] is a 29 y.o. male with  Active Problems:   Osteomyelitis   Anemia   DKA (diabetic ketoacidoses)   Infected wound   Bilateral pulmonary embolism   Palliative care encounter  Subjective: Pt c/o R sided pain.  Mom worried that aztreonam is giving him mental status change.   Abtx:  Anti-infectives    Start     Dose/Rate Route Frequency Ordered Stop   02/20/15 2300  vancomycin (VANCOCIN) 500 mg in sodium chloride 0.9 % 100 mL IVPB     500 mg 100 mL/hr over 60 Minutes Intravenous Every 12 hours 02/20/15 1018     02/20/15 1400  aztreonam (AZACTAM) 2 g in dextrose 5 % 50 mL IVPB     2 g 100 mL/hr over 30 Minutes Intravenous 3 times per day 02/19/15 1417     02/20/15 1030  vancomycin (VANCOCIN) IVPB 1000 mg/200 mL premix     1,000 mg 200 mL/hr over 60 Minutes Intravenous  Once 02/20/15 1018 02/20/15 1311   02/20/15 0800  metroNIDAZOLE (FLAGYL) IVPB 500 mg     500 mg 100 mL/hr over 60 Minutes Intravenous Every 8 hours 02/19/15 1417     02/20/15 0200  tigecycline (TYGACIL) 50 mg in sodium chloride 0.9 % 100 mL IVPB  Status:  Discontinued     50 mg 200 mL/hr over 30 Minutes Intravenous Every 12 hours 02/19/15 1323 02/19/15 1417   02/19/15 1400  tigecycline (TYGACIL) 100 mg in sodium chloride 0.9 % 100 mL IVPB     100 mg 200 mL/hr over 30 Minutes Intravenous  Once 02/19/15 1323 02/19/15 1526   02/19/15 0800  vancomycin (VANCOCIN) 500 mg in sodium chloride 0.9 % 100 mL IVPB  Status:  Discontinued     500 mg 100 mL/hr over 60 Minutes Intravenous Every 12 hours 02/18/15 1804 02/19/15 1323   02/19/15 0400  aztreonam (AZACTAM) 1 g in dextrose 5 % 50 mL IVPB  Status:  Discontinued     1 g 100 mL/hr over 30 Minutes Intravenous Every 8 hours 02/18/15 1804 02/18/15 2026   02/18/15 2200  aztreonam (AZACTAM) 2 g in dextrose 5 % 50 mL IVPB  Status:  Discontinued     2 g 100 mL/hr over 30 Minutes Intravenous 3 times per day 02/18/15 1745  02/18/15 1745   02/18/15 1800  vancomycin (VANCOCIN) IVPB 1000 mg/200 mL premix     1,000 mg 200 mL/hr over 60 Minutes Intravenous  Once 02/18/15 1745 02/18/15 1937   02/18/15 1800  aztreonam (AZACTAM) 2 g in dextrose 5 % 50 mL IVPB  Status:  Discontinued     2 g 100 mL/hr over 30 Minutes Intravenous  Once 02/18/15 1746 02/18/15 2026      Medications:  Scheduled: . aztreonam  2 g Intravenous 3 times per day  . baclofen  10 mg Oral BID WC  . baclofen  20 mg Oral QHS  . feeding supplement (GLUCERNA SHAKE)  237 mL Oral TID BM  . fentaNYL  25 mcg Transdermal Q72H  . insulin aspart  0-9 Units Subcutaneous TID WC  . [START ON 02/22/2015] insulin glargine  10 Units Subcutaneous Daily  . loratadine  10 mg Oral Daily  . metronidazole  500 mg Intravenous Q8H  . midodrine  10 mg Oral TID WC  . pantoprazole  40 mg Oral Daily  . potassium chloride SA  20 mEq Oral Daily  . pregabalin  100 mg  Oral BID  . pregabalin  200 mg Oral QHS  . sodium chloride  10-40 mL Intracatheter Q12H  . vancomycin  500 mg Intravenous Q12H    Objective: Vital signs in last 24 hours: Temp:  [96.9 F (36.1 C)-98.3 F (36.8 C)] 97 F (36.1 C) (05/16 0750) Pulse Rate:  [61-66] 65 (05/16 0750) Resp:  [12-18] 14 (05/16 0750) BP: (88-122)/(58-84) 92/69 mmHg (05/16 0750) SpO2:  [100 %] 100 % (05/16 0750) Weight:  [50.8 kg (111 lb 15.9 oz)] 50.8 kg (111 lb 15.9 oz) (05/15 2200)   General appearance: alert and moderate distress Resp: clear to auscultation bilaterally Cardio: regular rate and rhythm GI: normal findings: bowel sounds normal and soft, non-tender Extremities: RUE PIC clean.  Lab Results  Recent Labs  02/20/15 0456 02/21/15 0247  WBC 21.3* 18.0*  HGB 9.9* 10.4*  HCT 30.7* 32.1*  NA 130* 137  K 4.6 3.9  CL 109 116*  CO2 15* 16*  BUN 10 7  CREATININE 0.52* 0.41*   Liver Panel  Recent Labs  02/20/15 0456 02/21/15 0247  PROT 5.7* 6.2*  ALBUMIN 1.4* 1.5*  AST 16 9*  ALT 9* 8*    ALKPHOS 105 94  BILITOT 1.1 0.1*   Sedimentation Rate  Recent Labs  02/21/15 0247  ESRSEDRATE 60*   C-Reactive Protein  Recent Labs  02/21/15 0247  CRP 11.3*    Microbiology: Recent Results (from the past 240 hour(s))  Blood culture (routine x 2)     Status: None (Preliminary result)   Collection Time: 02/18/15  4:15 PM  Result Value Ref Range Status   Specimen Description BLOOD RIGHT ANTECUBITAL  Final   Special Requests BOTTLES DRAWN AEROBIC ONLY 2CC  Final   Culture   Final           BLOOD CULTURE RECEIVED NO GROWTH TO DATE CULTURE WILL BE HELD FOR 5 DAYS BEFORE ISSUING A FINAL NEGATIVE REPORT Performed at [facility]    Report Status PENDING  Incomplete  Blood culture (routine x 2)     Status: None (Preliminary result)   Collection Time: 02/18/15  4:18 PM  Result Value Ref Range Status   Specimen Description BLOOD LEFT ANTECUBITAL  Final   Special Requests BOTTLES DRAWN AEROBIC AND ANAEROBIC 5CC  Final   Culture   Final           BLOOD CULTURE RECEIVED NO GROWTH TO DATE CULTURE WILL BE HELD FOR 5 DAYS BEFORE ISSUING A FINAL NEGATIVE REPORT Performed at [facility]    Report Status PENDING  Incomplete  MRSA PCR Screening     Status: None   Collection Time: 02/18/15  8:33 PM  Result Value Ref Range Status   MRSA by PCR NEGATIVE NEGATIVE Final    Comment:        The GeneXpert MRSA Assay (FDA approved for NASAL specimens only), is one component of a comprehensive MRSA colonization surveillance program. It is not intended to diagnose MRSA infection nor to guide or monitor treatment for MRSA infections.     Studies/Results: Ct Angio Chest Pe W/cm &/or Wo Cm  02/20/2015   CLINICAL DATA:  Acute respiratory failure, hypoxia, shortness of breath, chest pain.  EXAM: CT ANGIOGRAPHY CHEST WITH CONTRAST  TECHNIQUE: Multidetector CT imaging of the chest was performed using the standard protocol during bolus administration of intravenous contrast.  Multiplanar CT image reconstructions and MIPs were obtained to evaluate the vascular anatomy.  CONTRAST:  6mL OMNIPAQUE IOHEXOL 350  MG/ML SOLN  COMPARISON:  January 11, 2015  FINDINGS: There is acute pulmonary embolus in segmental and subsegmental pulmonary arteries of the left lower lobe. There is acute pulmonary embolus in subsegmental pulmonary arteries of the right lower lobe. There are extensive consolidation of the right lower lobe and the medial aspect of the left lower lobe. There are small bilateral pleural effusions. There are lymphadenopathy in the mediastinum. The aorta is normal. There is small pericardial effusion. The visualized upper abdominal structures are normal.  Review of the MIP images confirms the above findings.  IMPRESSION: Acute pulmonary embolus involving bilateral lower lobes with extensive consolidation of the bilateral lower lobes as described. The consolidations are most likely impart due to pulmonary infarction but underlying pneumonia is not excluded.  Critical Value/emergent results were called by telephone at the time of interpretation on 02/20/2015 at 8:33 am to patient's nurse Clarisa Fling who verbally acknowledged these results.   Electronically Signed   By: Abelardo Diesel M.D.   On: 02/20/2015 08:40   Mr Lumbar Spine Wo Contrast  02/19/2015   CLINICAL DATA:  Infected sacral decubitus ulcers. Fever. Leukocytosis.  EXAM: MRI LUMBAR SPINE WITHOUT CONTRAST, MRI OF THE SACRUM WITH AND WITHOUT CONTRAST  TECHNIQUE: Multiplanar, multisequence MR imaging of the lumbar spine and sacrum was performed. Contrast was performed for the sacral imaging.  CONTRAST:  10 cc MultiHance  COMPARISON:  CT scan of the abdomen and pelvis dated 01/11/2015 and MRI of the pelvis dated 01/08/2015  FINDINGS: MRI of the lumbar spine:  Normal conus tip at L1-2. There is diffuse edema in the posterior paraspinal musculature and in the gluteal muscles. There is also atrophy of the paraspinal musculature. The  patient is quadriplegic. The paraspinal soft tissues are otherwise normal.  The discs from T11-12 through L5-S1 are normal. There is low signal throughout the bones of the lumbar spine which may represent a red marrow reactivation. There is no evidence of spinal infection. No spinal or foraminal stenosis or facet arthritis.  MRI of the sacrum:  There is abnormal edema and enhancement of the right ischial tuberosity. There is a deep decubitus ulcer that extends to the ischial tuberosity and there is a small abscess extending to enhance cm laterally from the decubitus ulcer at the ischial tuberosity. This pus collection does communicate with the decubitus ulcer.  There is devitalized soft tissue superficial to the left ischial tuberosity at the site of another decubitus ulcer. There is also devitalized soft tissue at the site of the soft tissue ulcer overlying the sacrum. There is slight edema and enhancement of the remnant of the fifth sacral segment and of the entire fourth thick sacral segment but there is no bone destruction.  No evidence of epidural infection in the sacrum.  There is abnormal subcutaneous edema throughout the pelvis with muscle atrophy.  IMPRESSION: 1. Multiple sacral and ischial tuberosity decubitus ulcers. Pus extends from the depths of the right ischial tuberosity ulcer 2.5 cm laterally. 2. Abnormal edema and enhancement of the right ischial tuberosity with loss of the cortical margin, consistent with osteomyelitis. 3. Abnormal edema and enhancement of the fourth sacral segment and of the remnant of the fifth sacral segment. This is immediately adjacent to a sacral decubitus ulcer. This is not definitive for osteomyelitis however.   Electronically Signed   By: Lorriane Shire M.D.   On: 02/19/2015 13:16   Mr Sacrum/si Joints W Wo Contrast  02/19/2015   CLINICAL DATA:  Infected sacral decubitus ulcers.  Fever. Leukocytosis.  EXAM: MRI LUMBAR SPINE WITHOUT CONTRAST, MRI OF THE SACRUM WITH AND  WITHOUT CONTRAST  TECHNIQUE: Multiplanar, multisequence MR imaging of the lumbar spine and sacrum was performed. Contrast was performed for the sacral imaging.  CONTRAST:  10 cc MultiHance  COMPARISON:  CT scan of the abdomen and pelvis dated 01/11/2015 and MRI of the pelvis dated 01/08/2015  FINDINGS: MRI of the lumbar spine:  Normal conus tip at L1-2. There is diffuse edema in the posterior paraspinal musculature and in the gluteal muscles. There is also atrophy of the paraspinal musculature. The patient is quadriplegic. The paraspinal soft tissues are otherwise normal.  The discs from T11-12 through L5-S1 are normal. There is low signal throughout the bones of the lumbar spine which may represent a red marrow reactivation. There is no evidence of spinal infection. No spinal or foraminal stenosis or facet arthritis.  MRI of the sacrum:  There is abnormal edema and enhancement of the right ischial tuberosity. There is a deep decubitus ulcer that extends to the ischial tuberosity and there is a small abscess extending to enhance cm laterally from the decubitus ulcer at the ischial tuberosity. This pus collection does communicate with the decubitus ulcer.  There is devitalized soft tissue superficial to the left ischial tuberosity at the site of another decubitus ulcer. There is also devitalized soft tissue at the site of the soft tissue ulcer overlying the sacrum. There is slight edema and enhancement of the remnant of the fifth sacral segment and of the entire fourth thick sacral segment but there is no bone destruction.  No evidence of epidural infection in the sacrum.  There is abnormal subcutaneous edema throughout the pelvis with muscle atrophy.  IMPRESSION: 1. Multiple sacral and ischial tuberosity decubitus ulcers. Pus extends from the depths of the right ischial tuberosity ulcer 2.5 cm laterally. 2. Abnormal edema and enhancement of the right ischial tuberosity with loss of the cortical margin, consistent  with osteomyelitis. 3. Abnormal edema and enhancement of the fourth sacral segment and of the remnant of the fifth sacral segment. This is immediately adjacent to a sacral decubitus ulcer. This is not definitive for osteomyelitis however.   Electronically Signed   By: Lorriane Shire M.D.   On: 02/19/2015 13:16   Dg Chest Port 1 View  02/20/2015   CLINICAL DATA:  Patient woke up this morning with chest pain and shortness of breath.  EXAM: PORTABLE CHEST - 1 VIEW  COMPARISON:  CT chest 01/11/2015.  Chest 01/10/2015.  FINDINGS: Borderline heart size with normal pulmonary vascularity. Patchy bilateral basilar airspace disease is improving since previous study. Small bilateral pleural effusions. Right PICC catheter tip is over the cavoatrial junction. No pneumothorax. Old fracture deformity of the right clavicle.  IMPRESSION: Basilar airspace infiltrates are improving since previous study. Small bilateral pleural effusions.   Electronically Signed   By: Lucienne Capers M.D.   On: 02/20/2015 05:42     Assessment/Plan: Acute B PE Fever Decubitus Osteomyelitis DM1, uncontrolled, mild DKA Severe Protein Calorie Malnutrition Hyponaremia  Total days of antibiotics: 3 vanco/aztreonam/flagyl  Will discuss with pharmacy regarding anbx alternatives. His mom asks about changing him to ceftriaxone, he has a noted cefuroxime allergy (anaphylaxis).   wound care.  ? Palliative care eval         [name] Infectious Diseases (pager) [phone] [email or web address] 02/21/2015, 11:04 AM  LOS: 3 days

## 2015-02-21 NOTE — Progress Notes (Addendum)
ANTICOAGULATION CONSULT NOTE - Follow Up Consult  Pharmacy Consult for Heparin  Indication: pulmonary embolus  Allergies  Allergen Reactions  . Cefuroxime Axetil Anaphylaxis  . Ertapenem Other (See Comments)    Rash and confusion  . Morphine And Related Other (See Comments)    Changed mental status, confusion, headache, visual hallucination  . Penicillins Anaphylaxis and Other (See Comments)    ?can take amoxicillin?  . Sulfa Antibiotics Anaphylaxis, Shortness Of Breath and Other (See Comments)  . Tessalon [Benzonatate] Anaphylaxis  . Shellfish Allergy Itching and Other (See Comments)    Took benadryl to alleviate reaction  . Miripirium Rash and Other (See Comments)    Change in mental status    Patient Measurements: Height: 5\' 8"  (172.7 cm) Weight: 111 lb 15.9 oz (50.8 kg) IBW/kg (Calculated) : 68.4  Heparin dosing weight is 50.8 kg  Vital Signs: Temp: 97 F (36.1 C) (05/16 0750) Temp Source: Axillary (05/16 0750) BP: 92/69 mmHg (05/16 0750) Pulse Rate: 65 (05/16 0750)  Labs:  Recent Labs  02/19/15 1327 02/20/15 0456 02/20/15 0458 02/20/15 1214 02/20/15 1607 02/20/15 1824 02/20/15 2313 02/21/15 0247 02/21/15 0725  HGB 10.0* 9.9*  --   --   --   --   --  10.4*  --   HCT 30.9* 30.7*  --   --   --   --   --  32.1*  --   PLT 779* 767*  --   --   --   --   --  891*  --   HEPARINUNFRC  --   --   --   --  <0.10*  --  0.14*  --  0.33  CREATININE 0.52* 0.52*  --   --   --   --   --  0.41*  --   TROPONINI  --   --  <0.03 <0.03  --  <0.03  --   --   --     Estimated Creatinine Clearance: 97 mL/min (by C-G formula based on Cr of 0.41).   Assessment: 29 year old male with new onset PE on IV heparin.  Heparin level this am is therapeutic at 0.33 but on the low end of goal.  CBC is low-stable. No bleeding reported.   Goal of Therapy:  Heparin level 0.3-0.7 units/ml Monitor platelets by anticoagulation protocol: Yes   Plan:  -Increase heparin to 1250  units/hr -Repeat level in 6 hours to confirm -Daily CBC/HL -Monitor for bleeding, trend Hgb (low on admission)  Sloan Leiter, PharmD, BCPS Clinical Pharmacist [phone] 02/21/2015,11:35 AM   Addendum: Confirmatory heparin level is therapeutic at 0.39. Continue heparin at 1250 units/hr and follow up AM labs.  Nena Jordan, PharmD, BCPS 02/21/2015, 6:42 PM

## 2015-02-21 NOTE — Progress Notes (Signed)
ANTICOAGULATION CONSULT NOTE - Follow Up Consult  Pharmacy Consult for Heparin  Indication: pulmonary embolus  Allergies  Allergen Reactions  . Cefuroxime Axetil Anaphylaxis  . Ertapenem Other (See Comments)    Rash and confusion  . Morphine And Related Other (See Comments)    Changed mental status, confusion, headache, visual hallucination  . Penicillins Anaphylaxis and Other (See Comments)    ?can take amoxicillin?  . Sulfa Antibiotics Anaphylaxis, Shortness Of Breath and Other (See Comments)  . Tessalon [Benzonatate] Anaphylaxis  . Shellfish Allergy Itching and Other (See Comments)    Took benadryl to alleviate reaction  . Miripirium Rash and Other (See Comments)    Change in mental status    Patient Measurements: Height: 5\' 8"  (172.7 cm) IBW/kg (Calculated) : 68.4  Vital Signs: Temp: 98.3 F (36.8 C) (05/15 2300) Temp Source: Axillary (05/15 2300) BP: 105/71 mmHg (05/15 2300) Pulse Rate: 62 (05/15 2300)  Labs:  Recent Labs  02/18/15 1615  02/19/15 0022 02/19/15 1327 02/20/15 0456 02/20/15 0458 02/20/15 1214 02/20/15 1607 02/20/15 1824 02/20/15 2313  HGB 7.2*  --   --  10.0* 9.9*  --   --   --   --   --   HCT 23.6*  --   --  30.9* 30.7*  --   --   --   --   --   PLT 812*  --   --  779* 767*  --   --   --   --   --   HEPARINUNFRC  --   --   --   --   --   --   --  <0.10*  --  0.14*  CREATININE 0.71  < > 0.51* 0.52* 0.52*  --   --   --   --   --   TROPONINI  --   --   --   --   --  <0.03 <0.03  --  <0.03  --   < > = values in this interval not displayed.  Estimated Creatinine Clearance: 97 mL/min (by C-G formula based on Cr of 0.52).   Assessment: Sub-therapeutic heparin level, drainage from wound site is stable/unchanged, other labs as above.   Goal of Therapy:  Heparin level 0.3-0.7 units/ml Monitor platelets by anticoagulation protocol: Yes   Plan:  -Increase heparin to 1200 units/hr -0800 HL -Daily CBC/HL -Monitor for bleeding, trend Hgb (low  on admission)  Narda Bonds 02/21/2015,12:19 AM

## 2015-02-21 NOTE — Progress Notes (Signed)
[facility] PROGRESS NOTE  Jason [name] YPP:[id number] DOB: 1986/08/12 DOA: 02/18/2015 PCP: [name], MD  Assessment/Plan: Active Problems:   Osteomyelitis   Anemia   DKA (diabetic ketoacidoses)   Infected wound   Bilateral pulmonary embolism   Palliative care encounter   Acute Hypoxemic respiratory failure Respiratory distress with complaints of chest pain last night CT angiogram shows Acute pulmonary embolus involving bilateral lower lobes with extensive consolidation of the bilateral lower lobes as described. The consolidations are most likely impart due to pulmonary infarction but underlying pneumonia is not excluded. Patient has been having bloody drainage from his sacral decubitus ulcer, short-term and not be a candidate for long-term anticoagulation However given significant amount of clot burden  continue heparin drip, he may be an appropriate candidate for an IVC filter According to PCCM continue heparin if he can tolerate . If so then best to treat for 7 days before undertaking surgical debridement. If he can't tolerate heparin right now, or if debridement takes precedence due to his infection, then   would place an IVC Filter and try to re-initiate the heparin when / if safe to do so. 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 Johnnye Sima consulted Last debridement. 12/08/14 by Dr. Migdalia Dk, will reconsult , doubt he is a surgical candidate  Wound care consultation pending Air mattress Currently on Azactam, Tygacil, metronidazole    leukocytosis likely secondary to pulmonary infarction   Chronic hypotension: Continue midodrine,   Chronic suprapubic catheter  :   Hyponatremia, sodium has dropped from 136> 129, in the last couple of weeks, likely secondary to poor oral intake continue normal saline, improving  Severe Protein calorie malnutrition: Mx per dietitian recommendation.  Chronic anemia: Without evidence of bleeding. S/p 1 unit of PRBC on 3/8. Stable.  Status post transfusion of 2 units on 5/14 Transfuse for active bleeding or hemoglobin less than 7  Uncontrolled DM 1: Mild DKA with metabolic acidosis Hypoglycemic last night dose of Lantus reduced Initially refused insulin drip, accepted to take Lantus and sliding scale insulin, increased dose of Lantus as CBGs still uncontrolled   Had a long discussion with mother at bedside, she basically feeds him regular food and compensate with higher prandial insulin.  [name] does not give him any insulin if his CBGs less than 200 Last hemoglobin A1c was 8.1   Neurogenic bladder:  Had Klebsiella and BURKHOLDERIA UTI on previous admissions  Spinal cord infarction and quadriplegia: Secondary to spinal cord infarction, patient awake alert and oriented 3. 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  S/p Diverting Colostomy.  Reactive thrombocytosis  Code Status: full Family Communication: family updated about patient's clinical progress Disposition Plan: Overall prognosis is extremely grim, disposition pending  Brief narrative: Jason [name] is a 29 y.o. male with hx of asthma, MRSA infection, seizures, spinal stroke in 4/15, PNA, UTI, ischial osteomyelitis , followed by infectious disease clinic presents with fever and mucopurulent drainage from his chronic decubitus ulcers. Has had multiple admissions for the same multiple debridements. Recently seen by Dr. Tommy Medal and labs showed leukocytosis. Pt was sent to ER for evaluation . Pt has been afebrile, Notes that he has a lot of bloody drainage and this is probably why he is more anemic  per his mother. The discharge  from skin ulcer is yellow green per his mother, and smells. Pt will be admitted for sacral decubitus infection, and anemia, and mild dka.   Consultants:  Infectious disease  Procedures:  None  Antibiotics: Vancomycin  HPI/Subjective: According to the mother the patient is  more confused. She is concerned about his antibiotics contributing to his confusion tive: Filed Vitals:   02/20/15 2200 02/20/15 2300 02/21/15 0400 02/21/15 0750  BP:  105/71 92/61 92/69   Pulse:  62 65 65  Temp:  98.3 F (36.8 C) 98 F (36.7 C) 97 F (36.1 C)  TempSrc:  Axillary Axillary Axillary  Resp:  15 14 14   Height: 5\' 8"  (1.727 m)     Weight: 50.8 kg (111 lb 15.9 oz)     SpO2:  100% 100% 100%    Intake/Output Summary (Last 24 hours) at 02/21/15 1051 Last data filed at 02/21/15 0751  Gross per 24 hour  Intake   1523 ml  Output   3200 ml  Net  -1677 ml    Exam:  Multiple deep wounds to right buttock and perineal region. Large pressure wound to mid back. Wounds w moderate, malodorous, yellowish-green drainage. No surrounding cellulitis. No fluctuance/abscess noted. No crepitus. No grossly necrotic tissue.  Lungs: Clear to auscultation bilaterally without wheezes or crackles Cardiovascular: Regular rate and rhythm without murmur gallop or rub normal S1 and S2 Abdomen: Nontender, nondistended, soft, bowel sounds positive, no rebound, no ascites, no appreciable mass Extremities: No significant cyanosis, clubbing, or edema bilateral lower extremities Neurological:alert to place and personlace, and time. He displays abnormal reflex. He exhibits abnormal muscle tone.  + quadriplegia      Data Reviewed: Basic Metabolic Panel:  Recent Labs Lab 02/18/15 2049 02/19/15 0022 02/19/15 1327 02/20/15 0456 02/20/15 1214 02/21/15 0247  NA 129* 129* 131* 130*  --  137  K 3.7 3.9 4.2 4.6  --  3.9  CL 102 105 108 109  --  116*  CO2 17* 16* 16* 15*  --  16*  GLUCOSE 249* 222* 180* 207*  --  70   BUN 12 11 9 10   --  7  CREATININE 0.61 0.51* 0.52* 0.52*  --  0.41*  CALCIUM 9.9 9.8 9.6 9.1  --  9.5  MG 1.1*  --   --   --  3.0* 1.9  PHOS 2.6  --   --   --   --   --     Liver Function Tests:  Recent Labs Lab 02/16/15 1545 02/18/15 1615 02/20/15 0456 02/21/15 0247  AST 13* 9* 16 9*  ALT 21 12* 9* 8*  ALKPHOS 130* 114 105 94  BILITOT 0.5 0.6 1.1 0.1*  PROT 6.7 7.1 5.7* 6.2*  ALBUMIN 2.0* 2.0* 1.4* 1.5*   No results for input(s): LIPASE, AMYLASE in the last 168 hours. No results for input(s): AMMONIA in the last 168 hours.  CBC:  Recent Labs Lab 02/16/15 1545 02/18/15 1615 02/19/15 1327 02/20/15 0456 02/21/15 0247  WBC 20.7* 17.6* 19.0* 21.3* 18.0*  NEUTROABS 15.7* 11.6*  --   --   --   HGB 8.0* 7.2* 10.0* 9.9* 10.4*  HCT 25.6* 23.6* 30.9* 30.7* 32.1*  MCV 79.5 79.5 80.9 81.2 80.5  PLT 951* 812* 779* 767* 891*    Cardiac Enzymes:  Recent Labs Lab 02/20/15 0458 02/20/15 1214 02/20/15 1824  TROPONINI <0.03 <0.03 <0.03   BNP (last 3 results) No results for input(s): BNP in the  last 8760 hours.  ProBNP (last 3 results) No results for input(s): PROBNP in the last 8760 hours.    CBG:  Recent Labs Lab 02/21/15 0110 02/21/15 0444 02/21/15 0515 02/21/15 0745 02/21/15 0853  GLUCAP 129* 33* 116* 67 142*    Recent Results (from the past 240 hour(s))  Blood culture (routine x 2)     Status: None (Preliminary result)   Collection Time: 02/18/15  4:15 PM  Result Value Ref Range Status   Specimen Description BLOOD RIGHT ANTECUBITAL  Final   Special Requests BOTTLES DRAWN AEROBIC ONLY 2CC  Final   Culture   Final           BLOOD CULTURE RECEIVED NO GROWTH TO DATE CULTURE WILL BE HELD FOR 5 DAYS BEFORE ISSUING A FINAL NEGATIVE REPORT Performed at [facility]    Report Status PENDING  Incomplete  Blood culture (routine x 2)     Status: None (Preliminary result)   Collection Time: 02/18/15  4:18 PM  Result Value Ref Range Status   Specimen  Description BLOOD LEFT ANTECUBITAL  Final   Special Requests BOTTLES DRAWN AEROBIC AND ANAEROBIC 5CC  Final   Culture   Final           BLOOD CULTURE RECEIVED NO GROWTH TO DATE CULTURE WILL BE HELD FOR 5 DAYS BEFORE ISSUING A FINAL NEGATIVE REPORT Performed at [facility]    Report Status PENDING  Incomplete  MRSA PCR Screening     Status: None   Collection Time: 02/18/15  8:33 PM  Result Value Ref Range Status   MRSA by PCR NEGATIVE NEGATIVE Final    Comment:        The GeneXpert MRSA Assay (FDA approved for NASAL specimens only), is one component of a comprehensive MRSA colonization surveillance program. It is not intended to diagnose MRSA infection nor to guide or monitor treatment for MRSA infections.      Studies: Ct Angio Chest Pe W/cm &/or Wo Cm  02/20/2015   CLINICAL DATA:  Acute respiratory failure, hypoxia, shortness of breath, chest pain.  EXAM: CT ANGIOGRAPHY CHEST WITH CONTRAST  TECHNIQUE: Multidetector CT imaging of the chest was performed using the standard protocol during bolus administration of intravenous contrast. Multiplanar CT image reconstructions and MIPs were obtained to evaluate the vascular anatomy.  CONTRAST:  98mL OMNIPAQUE IOHEXOL 350 MG/ML SOLN  COMPARISON:  January 11, 2015  FINDINGS: There is acute pulmonary embolus in segmental and subsegmental pulmonary arteries of the left lower lobe. There is acute pulmonary embolus in subsegmental pulmonary arteries of the right lower lobe. There are extensive consolidation of the right lower lobe and the medial aspect of the left lower lobe. There are small bilateral pleural effusions. There are lymphadenopathy in the mediastinum. The aorta is normal. There is small pericardial effusion. The visualized upper abdominal structures are normal.  Review of the MIP images confirms the above findings.  IMPRESSION: Acute pulmonary embolus involving bilateral lower lobes with extensive consolidation of the bilateral lower  lobes as described. The consolidations are most likely impart due to pulmonary infarction but underlying pneumonia is not excluded.  Critical Value/emergent results were called by telephone at the time of interpretation on 02/20/2015 at 8:33 am to patient's nurse Clarisa Fling who verbally acknowledged these results.   Electronically Signed   By: Abelardo Diesel M.D.   On: 02/20/2015 08:40   Mr Lumbar Spine Wo Contrast  02/19/2015   CLINICAL DATA:  Infected sacral decubitus ulcers.  Fever. Leukocytosis.  EXAM: MRI LUMBAR SPINE WITHOUT CONTRAST, MRI OF THE SACRUM WITH AND WITHOUT CONTRAST  TECHNIQUE: Multiplanar, multisequence MR imaging of the lumbar spine and sacrum was performed. Contrast was performed for the sacral imaging.  CONTRAST:  10 cc MultiHance  COMPARISON:  CT scan of the abdomen and pelvis dated 01/11/2015 and MRI of the pelvis dated 01/08/2015  FINDINGS: MRI of the lumbar spine:  Normal conus tip at L1-2. There is diffuse edema in the posterior paraspinal musculature and in the gluteal muscles. There is also atrophy of the paraspinal musculature. The patient is quadriplegic. The paraspinal soft tissues are otherwise normal.  The discs from T11-12 through L5-S1 are normal. There is low signal throughout the bones of the lumbar spine which may represent a red marrow reactivation. There is no evidence of spinal infection. No spinal or foraminal stenosis or facet arthritis.  MRI of the sacrum:  There is abnormal edema and enhancement of the right ischial tuberosity. There is a deep decubitus ulcer that extends to the ischial tuberosity and there is a small abscess extending to enhance cm laterally from the decubitus ulcer at the ischial tuberosity. This pus collection does communicate with the decubitus ulcer.  There is devitalized soft tissue superficial to the left ischial tuberosity at the site of another decubitus ulcer. There is also devitalized soft tissue at the site of the soft tissue ulcer  overlying the sacrum. There is slight edema and enhancement of the remnant of the fifth sacral segment and of the entire fourth thick sacral segment but there is no bone destruction.  No evidence of epidural infection in the sacrum.  There is abnormal subcutaneous edema throughout the pelvis with muscle atrophy.  IMPRESSION: 1. Multiple sacral and ischial tuberosity decubitus ulcers. Pus extends from the depths of the right ischial tuberosity ulcer 2.5 cm laterally. 2. Abnormal edema and enhancement of the right ischial tuberosity with loss of the cortical margin, consistent with osteomyelitis. 3. Abnormal edema and enhancement of the fourth sacral segment and of the remnant of the fifth sacral segment. This is immediately adjacent to a sacral decubitus ulcer. This is not definitive for osteomyelitis however.   Electronically Signed   By: Lorriane Shire M.D.   On: 02/19/2015 13:16   Mr Sacrum/si Joints W Wo Contrast  02/19/2015   CLINICAL DATA:  Infected sacral decubitus ulcers. Fever. Leukocytosis.  EXAM: MRI LUMBAR SPINE WITHOUT CONTRAST, MRI OF THE SACRUM WITH AND WITHOUT CONTRAST  TECHNIQUE: Multiplanar, multisequence MR imaging of the lumbar spine and sacrum was performed. Contrast was performed for the sacral imaging.  CONTRAST:  10 cc MultiHance  COMPARISON:  CT scan of the abdomen and pelvis dated 01/11/2015 and MRI of the pelvis dated 01/08/2015  FINDINGS: MRI of the lumbar spine:  Normal conus tip at L1-2. There is diffuse edema in the posterior paraspinal musculature and in the gluteal muscles. There is also atrophy of the paraspinal musculature. The patient is quadriplegic. The paraspinal soft tissues are otherwise normal.  The discs from T11-12 through L5-S1 are normal. There is low signal throughout the bones of the lumbar spine which may represent a red marrow reactivation. There is no evidence of spinal infection. No spinal or foraminal stenosis or facet arthritis.  MRI of the sacrum:  There is  abnormal edema and enhancement of the right ischial tuberosity. There is a deep decubitus ulcer that extends to the ischial tuberosity and there is a small abscess extending to enhance cm laterally from  the decubitus ulcer at the ischial tuberosity. This pus collection does communicate with the decubitus ulcer.  There is devitalized soft tissue superficial to the left ischial tuberosity at the site of another decubitus ulcer. There is also devitalized soft tissue at the site of the soft tissue ulcer overlying the sacrum. There is slight edema and enhancement of the remnant of the fifth sacral segment and of the entire fourth thick sacral segment but there is no bone destruction.  No evidence of epidural infection in the sacrum.  There is abnormal subcutaneous edema throughout the pelvis with muscle atrophy.  IMPRESSION: 1. Multiple sacral and ischial tuberosity decubitus ulcers. Pus extends from the depths of the right ischial tuberosity ulcer 2.5 cm laterally. 2. Abnormal edema and enhancement of the right ischial tuberosity with loss of the cortical margin, consistent with osteomyelitis. 3. Abnormal edema and enhancement of the fourth sacral segment and of the remnant of the fifth sacral segment. This is immediately adjacent to a sacral decubitus ulcer. This is not definitive for osteomyelitis however.   Electronically Signed   By: Lorriane Shire M.D.   On: 02/19/2015 13:16   Dg Chest Port 1 View  02/20/2015   CLINICAL DATA:  Patient woke up this morning with chest pain and shortness of breath.  EXAM: PORTABLE CHEST - 1 VIEW  COMPARISON:  CT chest 01/11/2015.  Chest 01/10/2015.  FINDINGS: Borderline heart size with normal pulmonary vascularity. Patchy bilateral basilar airspace disease is improving since previous study. Small bilateral pleural effusions. Right PICC catheter tip is over the cavoatrial junction. No pneumothorax. Old fracture deformity of the right clavicle.  IMPRESSION: Basilar airspace  infiltrates are improving since previous study. Small bilateral pleural effusions.   Electronically Signed   By: Lucienne Capers M.D.   On: 02/20/2015 05:42    Scheduled Meds: . aztreonam  2 g Intravenous 3 times per day  . baclofen  10 mg Oral BID WC  . baclofen  20 mg Oral QHS  . feeding supplement (GLUCERNA SHAKE)  237 mL Oral TID BM  . fentaNYL  25 mcg Transdermal Q72H  . insulin aspart  0-9 Units Subcutaneous TID WC  . insulin glargine  20 Units Subcutaneous Daily  . loratadine  10 mg Oral Daily  . metronidazole  500 mg Intravenous Q8H  . midodrine  10 mg Oral TID WC  . pantoprazole  40 mg Oral Daily  . potassium chloride SA  20 mEq Oral Daily  . pregabalin  100 mg Oral BID  . pregabalin  200 mg Oral QHS  . sodium chloride  10-40 mL Intracatheter Q12H  . vancomycin  500 mg Intravenous Q12H   Continuous Infusions: . dextrose 5 % and 0.9% NaCl 75 mL/hr at 02/21/15 0513  . heparin 1,200 Units/hr (02/21/15 0816)    Active Problems:   Osteomyelitis   Anemia   DKA (diabetic ketoacidoses)   Infected wound   Bilateral pulmonary embolism   Palliative care encounter    Time spent: 40 minutes   [facility] Pager [phone]. If 7PM-7AM, please contact night-coverage at [email or web address], password [facility] 02/21/2015, 10:51 AM  LOS: 3 days

## 2015-02-21 NOTE — Progress Notes (Signed)
Hypoglycemic Event  CBG: 33  Treatment: D50 IV 50 mL  Symptoms: None  Follow-up CBG: Time:0506 CBG Result:116  Possible Reasons for Event: Inadequate meal intake  Comments/MD notified:Kirby NP, new orders received.     Wendall Mola  Remember to initiate Hypoglycemia Order Set & complete

## 2015-02-22 LAB — COMPREHENSIVE METABOLIC PANEL
ALBUMIN: 1.4 g/dL — AB (ref 3.5–5.0)
ALT: 6 U/L — ABNORMAL LOW (ref 17–63)
ANION GAP: 5 (ref 5–15)
AST: 9 U/L — ABNORMAL LOW (ref 15–41)
Alkaline Phosphatase: 101 U/L (ref 38–126)
BILIRUBIN TOTAL: 0.3 mg/dL (ref 0.3–1.2)
BUN: 6 mg/dL (ref 6–20)
CHLORIDE: 111 mmol/L (ref 101–111)
CO2: 18 mmol/L — ABNORMAL LOW (ref 22–32)
Calcium: 9 mg/dL (ref 8.9–10.3)
Creatinine, Ser: 0.46 mg/dL — ABNORMAL LOW (ref 0.61–1.24)
GFR calc non Af Amer: >60 mL/min (ref >60)
Glucose, Bld: 187 mg/dL — ABNORMAL HIGH (ref 65–99)
Potassium: 3.9 mmol/L (ref 3.5–5.1)
Sodium: 134 mmol/L — ABNORMAL LOW (ref 135–145)
Total Protein: 5.6 g/dL — ABNORMAL LOW (ref 6.5–8.1)

## 2015-02-22 LAB — GLUCOSE, CAPILLARY
GLUCOSE-CAPILLARY: 239 mg/dL — AB (ref 65–99)
GLUCOSE-CAPILLARY: 252 mg/dL — AB (ref 65–99)
Glucose-Capillary: 165 mg/dL — ABNORMAL HIGH (ref 65–99)
Glucose-Capillary: 174 mg/dL — ABNORMAL HIGH (ref 65–99)
Glucose-Capillary: 189 mg/dL — ABNORMAL HIGH (ref 65–99)
Glucose-Capillary: 193 mg/dL — ABNORMAL HIGH (ref 65–99)

## 2015-02-22 LAB — CBC
HCT: 31.1 % — ABNORMAL LOW (ref 39.0–52.0)
Hemoglobin: 9.9 g/dL — ABNORMAL LOW (ref 13.0–17.0)
MCH: 25.7 pg — ABNORMAL LOW (ref 26.0–34.0)
MCHC: 31.8 g/dL (ref 30.0–36.0)
MCV: 80.8 fL (ref 78.0–100.0)
PLATELETS: 881 10*3/uL — AB (ref 150–400)
RBC: 3.85 MIL/uL — ABNORMAL LOW (ref 4.22–5.81)
RDW: 15 % (ref 11.5–15.5)
WBC: 18.6 10*3/uL — ABNORMAL HIGH (ref 4.0–10.5)

## 2015-02-22 LAB — HEPARIN LEVEL (UNFRACTIONATED)
HEPARIN UNFRACTIONATED: 0.13 [IU]/mL — AB (ref 0.30–0.70)
HEPARIN UNFRACTIONATED: 0.53 [IU]/mL (ref 0.30–0.70)
Heparin Unfractionated: 0.3 [IU]/mL (ref 0.30–0.70)

## 2015-02-22 MED ORDER — FENTANYL CITRATE (PF) 100 MCG/2ML IJ SOLN
50.0000 ug | Freq: Once | INTRAMUSCULAR | Status: AC
  Administered 2015-02-22: 50 ug via INTRAVENOUS
  Filled 2015-02-22: qty 2

## 2015-02-22 MED ORDER — HEPARIN BOLUS VIA INFUSION
1500.0000 [IU] | Freq: Once | INTRAVENOUS | Status: AC
  Administered 2015-02-22: 1500 [IU] via INTRAVENOUS
  Filled 2015-02-22: qty 1500

## 2015-02-22 MED ORDER — HEPARIN (PORCINE) IN NACL 100-0.45 UNIT/ML-% IJ SOLN
1700.0000 [IU]/h | INTRAMUSCULAR | Status: DC
  Administered 2015-02-22 – 2015-03-02 (×11): 1500 [IU]/h via INTRAVENOUS
  Administered 2015-03-03: 1600 [IU]/h via INTRAVENOUS
  Filled 2015-02-22 (×17): qty 250

## 2015-02-22 MED ORDER — FENTANYL CITRATE (PF) 100 MCG/2ML IJ SOLN
50.0000 ug | Freq: Once | INTRAMUSCULAR | Status: AC
  Administered 2015-02-23: 50 ug via INTRAVENOUS
  Filled 2015-02-22: qty 2

## 2015-02-22 NOTE — Progress Notes (Signed)
[facility] PROGRESS NOTE  [name] GXQ:[id number] DOB: 1986/04/15 DOA: 02/18/2015 PCP: [name], MD  Assessment/Plan: Active Problems:   Osteomyelitis   Anemia   DKA (diabetic ketoacidoses)   Infected wound   Bilateral pulmonary embolism   Palliative care encounter   Pulmonary embolus  Brief narrative: Jason [name] is a 29 y.o. male with hx of asthma, MRSA infection, seizures, spinal stroke in 4/15, PNA, UTI, ischial osteomyelitis , followed by infectious disease clinic presents with fever and mucopurulent drainage from his chronic decubitus ulcers. Has had multiple admissions for the same multiple debridements. Recently seen by Dr. Tommy Medal and labs showed leukocytosis. Pt was sent to ER for evaluation . Pt has been afebrile, Notes that he has a lot of bloody drainage and this is probably why he is more anemic per his mother. The discharge from skin ulcer is yellow green per his mother, and smells. Pt will be admitted for sacral decubitus infection, and anemia, and mild dka. Infectious disease consulted because of multiple courses of antibiotic treatment(referred to pharmacy note from 5/14. Dr. Johnnye Sima managing antibiotics. Dr. Bobetta Lime to see the patient for possible debridement. If the patient needs to go to the OR, he would need an IVC filter, interventional radiology is aware. Palliative care consulted.  Detailed Hospital course   Acute Hypoxemic respiratory failure CT angiogram shows Acute pulmonary embolus involving bilateral lower lobes with extensive consolidation of the bilateral lower lobes as described. The consolidations are most likely impart due to pulmonary infarction but underlying pneumonia is not excluded. Patient has been having bloody drainage from his sacral decubitus ulcer, requiring PRBC transfusion, short-term started on heparin drip , long-term  anticoagulation to be determined, Coumadin versus NOAC  According to PCCM continue heparin  for now . If so then best to treat for 7 days before undertaking surgical debridement. If he can't tolerate heparin right now due to bleeding complications, or if surgical debridement of his wounds takes precedence due to his infection, then we  would place an IVC Filter and try to re-initiate anticoagulation when / if safe to do so. 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 Johnnye Sima consulted Last debridement. 12/08/14 by Dr. Migdalia Dk, she has been consulted during this admission Waiting on further recommendations Wound care consultation completed, see note from 5/16 Continue Air mattress Currently on Azactam, Tygacil, metronidazole -day #4   Chronic hypotension: Continue midodrine, dose increased during this admission  Chronic suprapubic catheter :   Hyponatremia, sodium has dropped from 136> 129, in the last couple of weeks, likely secondary to poor oral intake improved after being placed on normal saline  Severe Protein calorie malnutrition: Mx per dietitian recommendation.  Chronic anemia: Without evidence of bleeding. S/p 1 unit of PRBC on 3/8. Stable.  Status post transfusion of 2 units on 5/14 Transfuse for active bleeding or hemoglobin less than 7  Uncontrolled DM 1: Mild DKA with metabolic acidosis-   refused insulin drip this admission, accepted to take Lantus and sliding scale insulin, increased dose of Lantus as CBGs still uncontrolled   Had a long discussion with mother at bedside, she basically feeds him regular food and compensate with higher prandial insulin.  [name] does  not give him any insulin if his CBGs less than 200 Last  hemoglobin A1c was 8.1   Neurogenic bladder:  Had Klebsiella and BURKHOLDERIA UTI on previous admissions  Spinal cord infarction and quadriplegia: Secondary to spinal cord infarction, patient awake alert and oriented 3. 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  S/p Diverting Colostomy.  Reactive thrombocytosis  Code Status: full Family Communication: family updated about patient's clinical progress Disposition Plan: Overall prognosis is extremely grim, plastic surgery consultation and final recommendations pending      Consultants:  Infectious disease  Procedures:  None  Antibiotics: Vancomycin  HPI/Subjective: Patient is not confused today, blood pressure soft, afebrile overnight, tive: Filed Vitals:   02/22/15 0200 02/22/15 0400 02/22/15 0600 02/22/15 0823  BP: 133/82 106/79 126/82 114/76  Pulse: 78 79 76 73  Temp:  97.1 F (36.2 C)  97.9 F (36.6 C)  TempSrc:  Axillary  Oral  Resp: 13 19 15 16   Height:      Weight:      SpO2: 100% 100% 100% 100%    Intake/Output Summary (Last 24 hours) at 02/22/15 0916 Last data filed at 02/22/15 0824  Gross per 24 hour  Intake 1778.7 ml  Output   2000 ml  Net -221.3 ml    Exam:  Multiple deep wounds to right buttock and perineal region. Large pressure wound to mid back. Wounds w moderate, malodorous, yellowish-green drainage. No surrounding cellulitis. No fluctuance/abscess noted. No crepitus. No grossly necrotic tissue.  Lungs: Clear to auscultation bilaterally without wheezes or crackles Cardiovascular: Regular rate and rhythm without murmur gallop or rub normal S1 and S2 Abdomen: Nontender, nondistended, soft, bowel sounds positive, no rebound, no ascites, no appreciable mass Extremities: No significant cyanosis, clubbing, or edema bilateral lower extremities Neurological:alert to place and personlace, and time. He displays abnormal reflex. He exhibits abnormal muscle tone.  + quadriplegia       Data Reviewed: Basic Metabolic Panel:  Recent Labs Lab 02/18/15 2049 02/19/15 0022 02/19/15 1327 02/20/15 0456 02/20/15 1214 02/21/15 0247 02/22/15 0415  NA 129* 129* 131* 130*  --  137 134*  K 3.7 3.9 4.2 4.6  --  3.9 3.9  CL 102 105 108 109  --  116* 111  CO2 17* 16* 16* 15*  --  16* 18*  GLUCOSE 249* 222* 180* 207*  --  70 187*  BUN 12 11 9 10   --  7 6  CREATININE 0.61 0.51* 0.52* 0.52*  --  0.41* 0.46*  CALCIUM 9.9 9.8 9.6 9.1  --  9.5 9.0  MG 1.1*  --   --   --  3.0* 1.9  --   PHOS 2.6  --   --   --   --   --   --     Liver Function Tests:  Recent Labs Lab 02/16/15 1545 02/18/15 1615 02/20/15 0456 02/21/15 0247 02/22/15 0415  AST 13* 9* 16 9* 9*  ALT 21 12* 9* 8* 6*  ALKPHOS 130* 114 105 94 101  BILITOT 0.5 0.6 1.1 0.1* 0.3  PROT 6.7 7.1 5.7* 6.2* 5.6*  ALBUMIN 2.0* 2.0* 1.4* 1.5* 1.4*   No results for input(s): LIPASE, AMYLASE in the last 168 hours. No results for input(s): AMMONIA in the last 168 hours.  CBC:  Recent Labs Lab 02/16/15 1545 02/18/15 1615 02/19/15 1327 02/20/15 0456 02/21/15 0247 02/22/15 0415  WBC 20.7* 17.6* 19.0* 21.3* 18.0* 18.6*  NEUTROABS 15.7* 11.6*  --   --   --   --  HGB 8.0* 7.2* 10.0* 9.9* 10.4* 9.9*  HCT 25.6* 23.6* 30.9* 30.7* 32.1* 31.1*  MCV 79.5 79.5 80.9 81.2 80.5 80.8  PLT 951* 812* 779* 767* 891* 881*    Cardiac Enzymes:  Recent Labs Lab 02/20/15 0458 02/20/15 1214 02/20/15 1824  TROPONINI <0.03 <0.03 <0.03   BNP (last 3 results) No results for input(s): BNP in the last 8760 hours.  ProBNP (last 3 results) No results for input(s): PROBNP in the last 8760 hours.    CBG:  Recent Labs Lab 02/21/15 1559 02/21/15 2108 02/22/15 0019 02/22/15 0443 02/22/15 0826  GLUCAP 165* 180* 193* 174* 165*    Recent Results (from the past 240 hour(s))  Blood culture (routine x 2)     Status: None (Preliminary result)   Collection Time: 02/18/15  4:15 PM  Result Value Ref Range Status    Specimen Description BLOOD RIGHT ANTECUBITAL  Final   Special Requests BOTTLES DRAWN AEROBIC ONLY 2CC  Final   Culture   Final           BLOOD CULTURE RECEIVED NO GROWTH TO DATE CULTURE WILL BE HELD FOR 5 DAYS BEFORE ISSUING A FINAL NEGATIVE REPORT Performed at [facility]    Report Status PENDING  Incomplete  Blood culture (routine x 2)     Status: None (Preliminary result)   Collection Time: 02/18/15  4:18 PM  Result Value Ref Range Status   Specimen Description BLOOD LEFT ANTECUBITAL  Final   Special Requests BOTTLES DRAWN AEROBIC AND ANAEROBIC 5CC  Final   Culture   Final           BLOOD CULTURE RECEIVED NO GROWTH TO DATE CULTURE WILL BE HELD FOR 5 DAYS BEFORE ISSUING A FINAL NEGATIVE REPORT Performed at [facility]    Report Status PENDING  Incomplete  MRSA PCR Screening     Status: None   Collection Time: 02/18/15  8:33 PM  Result Value Ref Range Status   MRSA by PCR NEGATIVE NEGATIVE Final    Comment:        The GeneXpert MRSA Assay (FDA approved for NASAL specimens only), is one component of a comprehensive MRSA colonization surveillance program. It is not intended to diagnose MRSA infection nor to guide or monitor treatment for MRSA infections.   Wound culture     Status: None (Preliminary result)   Collection Time: 02/21/15  6:32 AM  Result Value Ref Range Status   Specimen Description WOUND SACRAL  Final   Special Requests Normal  Final   Gram Stain   Final    NO WBC SEEN NO SQUAMOUS EPITHELIAL CELLS SEEN FEW GRAM VARIABLE ROD Performed at [facility]   Final    Culture reincubated for better growth Performed at [facility]    Report Status PENDING  Incomplete     Studies: Ct Angio Chest Pe W/cm &/or Wo Cm  02/20/2015   CLINICAL DATA:  Acute respiratory failure, hypoxia, shortness of breath, chest pain.  EXAM: CT ANGIOGRAPHY CHEST WITH CONTRAST  TECHNIQUE: Multidetector CT imaging of the chest was performed  using the standard protocol during bolus administration of intravenous contrast. Multiplanar CT image reconstructions and MIPs were obtained to evaluate the vascular anatomy.  CONTRAST:  45mL OMNIPAQUE IOHEXOL 350 MG/ML SOLN  COMPARISON:  January 11, 2015  FINDINGS: There is acute pulmonary embolus in segmental and subsegmental pulmonary arteries of the left lower lobe. There is acute pulmonary embolus in subsegmental pulmonary  arteries of the right lower lobe. There are extensive consolidation of the right lower lobe and the medial aspect of the left lower lobe. There are small bilateral pleural effusions. There are lymphadenopathy in the mediastinum. The aorta is normal. There is small pericardial effusion. The visualized upper abdominal structures are normal.  Review of the MIP images confirms the above findings.  IMPRESSION: Acute pulmonary embolus involving bilateral lower lobes with extensive consolidation of the bilateral lower lobes as described. The consolidations are most likely impart due to pulmonary infarction but underlying pneumonia is not excluded.  Critical Value/emergent results were called by telephone at the time of interpretation on 02/20/2015 at 8:33 am to patient's nurse Clarisa Fling who verbally acknowledged these results.   Electronically Signed   By: Abelardo Diesel M.D.   On: 02/20/2015 08:40   Mr Lumbar Spine Wo Contrast  02/19/2015   CLINICAL DATA:  Infected sacral decubitus ulcers. Fever. Leukocytosis.  EXAM: MRI LUMBAR SPINE WITHOUT CONTRAST, MRI OF THE SACRUM WITH AND WITHOUT CONTRAST  TECHNIQUE: Multiplanar, multisequence MR imaging of the lumbar spine and sacrum was performed. Contrast was performed for the sacral imaging.  CONTRAST:  10 cc MultiHance  COMPARISON:  CT scan of the abdomen and pelvis dated 01/11/2015 and MRI of the pelvis dated 01/08/2015  FINDINGS: MRI of the lumbar spine:  Normal conus tip at L1-2. There is diffuse edema in the posterior paraspinal musculature and  in the gluteal muscles. There is also atrophy of the paraspinal musculature. The patient is quadriplegic. The paraspinal soft tissues are otherwise normal.  The discs from T11-12 through L5-S1 are normal. There is low signal throughout the bones of the lumbar spine which may represent a red marrow reactivation. There is no evidence of spinal infection. No spinal or foraminal stenosis or facet arthritis.  MRI of the sacrum:  There is abnormal edema and enhancement of the right ischial tuberosity. There is a deep decubitus ulcer that extends to the ischial tuberosity and there is a small abscess extending to enhance cm laterally from the decubitus ulcer at the ischial tuberosity. This pus collection does communicate with the decubitus ulcer.  There is devitalized soft tissue superficial to the left ischial tuberosity at the site of another decubitus ulcer. There is also devitalized soft tissue at the site of the soft tissue ulcer overlying the sacrum. There is slight edema and enhancement of the remnant of the fifth sacral segment and of the entire fourth thick sacral segment but there is no bone destruction.  No evidence of epidural infection in the sacrum.  There is abnormal subcutaneous edema throughout the pelvis with muscle atrophy.  IMPRESSION: 1. Multiple sacral and ischial tuberosity decubitus ulcers. Pus extends from the depths of the right ischial tuberosity ulcer 2.5 cm laterally. 2. Abnormal edema and enhancement of the right ischial tuberosity with loss of the cortical margin, consistent with osteomyelitis. 3. Abnormal edema and enhancement of the fourth sacral segment and of the remnant of the fifth sacral segment. This is immediately adjacent to a sacral decubitus ulcer. This is not definitive for osteomyelitis however.   Electronically Signed   By: Lorriane Shire M.D.   On: 02/19/2015 13:16   Mr Sacrum/si Joints W Wo Contrast  02/19/2015   CLINICAL DATA:  Infected sacral decubitus ulcers. Fever.  Leukocytosis.  EXAM: MRI LUMBAR SPINE WITHOUT CONTRAST, MRI OF THE SACRUM WITH AND WITHOUT CONTRAST  TECHNIQUE: Multiplanar, multisequence MR imaging of the lumbar spine and sacrum was performed. Contrast was  performed for the sacral imaging.  CONTRAST:  10 cc MultiHance  COMPARISON:  CT scan of the abdomen and pelvis dated 01/11/2015 and MRI of the pelvis dated 01/08/2015  FINDINGS: MRI of the lumbar spine:  Normal conus tip at L1-2. There is diffuse edema in the posterior paraspinal musculature and in the gluteal muscles. There is also atrophy of the paraspinal musculature. The patient is quadriplegic. The paraspinal soft tissues are otherwise normal.  The discs from T11-12 through L5-S1 are normal. There is low signal throughout the bones of the lumbar spine which may represent a red marrow reactivation. There is no evidence of spinal infection. No spinal or foraminal stenosis or facet arthritis.  MRI of the sacrum:  There is abnormal edema and enhancement of the right ischial tuberosity. There is a deep decubitus ulcer that extends to the ischial tuberosity and there is a small abscess extending to enhance cm laterally from the decubitus ulcer at the ischial tuberosity. This pus collection does communicate with the decubitus ulcer.  There is devitalized soft tissue superficial to the left ischial tuberosity at the site of another decubitus ulcer. There is also devitalized soft tissue at the site of the soft tissue ulcer overlying the sacrum. There is slight edema and enhancement of the remnant of the fifth sacral segment and of the entire fourth thick sacral segment but there is no bone destruction.  No evidence of epidural infection in the sacrum.  There is abnormal subcutaneous edema throughout the pelvis with muscle atrophy.  IMPRESSION: 1. Multiple sacral and ischial tuberosity decubitus ulcers. Pus extends from the depths of the right ischial tuberosity ulcer 2.5 cm laterally. 2. Abnormal edema and  enhancement of the right ischial tuberosity with loss of the cortical margin, consistent with osteomyelitis. 3. Abnormal edema and enhancement of the fourth sacral segment and of the remnant of the fifth sacral segment. This is immediately adjacent to a sacral decubitus ulcer. This is not definitive for osteomyelitis however.   Electronically Signed   By: Lorriane Shire M.D.   On: 02/19/2015 13:16   Dg Chest Port 1 View  02/20/2015   CLINICAL DATA:  Patient woke up this morning with chest pain and shortness of breath.  EXAM: PORTABLE CHEST - 1 VIEW  COMPARISON:  CT chest 01/11/2015.  Chest 01/10/2015.  FINDINGS: Borderline heart size with normal pulmonary vascularity. Patchy bilateral basilar airspace disease is improving since previous study. Small bilateral pleural effusions. Right PICC catheter tip is over the cavoatrial junction. No pneumothorax. Old fracture deformity of the right clavicle.  IMPRESSION: Basilar airspace infiltrates are improving since previous study. Small bilateral pleural effusions.   Electronically Signed   By: Lucienne Capers M.D.   On: 02/20/2015 05:42    Scheduled Meds: . aztreonam  2 g Intravenous 3 times per day  . baclofen  10 mg Oral BID WC  . baclofen  20 mg Oral QHS  . feeding supplement (ENSURE ENLIVE)  237 mL Oral BID BM  . feeding supplement (GLUCERNA SHAKE)  237 mL Oral TID BM  . fentaNYL  25 mcg Transdermal Q72H  . insulin aspart  0-9 Units Subcutaneous TID WC  . insulin glargine  10 Units Subcutaneous Daily  . loratadine  10 mg Oral Daily  . metoCLOPramide  5 mg Oral Daily  . metronidazole  500 mg Intravenous Q8H  . midodrine  10 mg Oral TID WC  . pantoprazole  40 mg Oral Daily  . potassium chloride SA  20 mEq  Oral Daily  . pregabalin  100 mg Oral BID  . pregabalin  200 mg Oral QHS  . sodium chloride  10-40 mL Intracatheter Q12H  . vancomycin  500 mg Intravenous Q12H   Continuous Infusions: . dextrose 5 % and 0.9% NaCl 75 mL/hr at 02/22/15 0532  .  heparin 1,400 Units/hr (02/22/15 0534)    Active Problems:   Osteomyelitis   Anemia   DKA (diabetic ketoacidoses)   Infected wound   Bilateral pulmonary embolism   Palliative care encounter   Pulmonary embolus    Time spent: 40 minutes   [facility] Pager [phone]. If 7PM-7AM, please contact night-coverage at [email or web address], password [email or web address] 02/22/2015, 9:16 AM  LOS: 4 days

## 2015-02-22 NOTE — Progress Notes (Signed)
INFECTIOUS DISEASE PROGRESS NOTE  ID: Jason [name] is a 29 y.o. male with  Active Problems:   Osteomyelitis   Anemia   DKA (diabetic ketoacidoses)   Infected wound   Bilateral pulmonary embolism   Palliative care encounter   Pulmonary embolus  Subjective: Awake and animated. Feels much better.   Abtx:  Anti-infectives    Start     Dose/Rate Route Frequency Ordered Stop   02/20/15 2300  vancomycin (VANCOCIN) 500 mg in sodium chloride 0.9 % 100 mL IVPB     500 mg 100 mL/hr over 60 Minutes Intravenous Every 12 hours 02/20/15 1018     02/20/15 1400  aztreonam (AZACTAM) 2 g in dextrose 5 % 50 mL IVPB     2 g 100 mL/hr over 30 Minutes Intravenous 3 times per day 02/19/15 1417     02/20/15 1030  vancomycin (VANCOCIN) IVPB 1000 mg/200 mL premix     1,000 mg 200 mL/hr over 60 Minutes Intravenous  Once 02/20/15 1018 02/20/15 1311   02/20/15 0800  metroNIDAZOLE (FLAGYL) IVPB 500 mg     500 mg 100 mL/hr over 60 Minutes Intravenous Every 8 hours 02/19/15 1417     02/20/15 0200  tigecycline (TYGACIL) 50 mg in sodium chloride 0.9 % 100 mL IVPB  Status:  Discontinued     50 mg 200 mL/hr over 30 Minutes Intravenous Every 12 hours 02/19/15 1323 02/19/15 1417   02/19/15 1400  tigecycline (TYGACIL) 100 mg in sodium chloride 0.9 % 100 mL IVPB     100 mg 200 mL/hr over 30 Minutes Intravenous  Once 02/19/15 1323 02/19/15 1526   02/19/15 0800  vancomycin (VANCOCIN) 500 mg in sodium chloride 0.9 % 100 mL IVPB  Status:  Discontinued     500 mg 100 mL/hr over 60 Minutes Intravenous Every 12 hours 02/18/15 1804 02/19/15 1323   02/19/15 0400  aztreonam (AZACTAM) 1 g in dextrose 5 % 50 mL IVPB  Status:  Discontinued     1 g 100 mL/hr over 30 Minutes Intravenous Every 8 hours 02/18/15 1804 02/18/15 2026   02/18/15 2200  aztreonam (AZACTAM) 2 g in dextrose 5 % 50 mL IVPB  Status:  Discontinued     2 g 100 mL/hr over 30 Minutes Intravenous 3 times per day 02/18/15 1745 02/18/15 1745   02/18/15  1800  vancomycin (VANCOCIN) IVPB 1000 mg/200 mL premix     1,000 mg 200 mL/hr over 60 Minutes Intravenous  Once 02/18/15 1745 02/18/15 1937   02/18/15 1800  aztreonam (AZACTAM) 2 g in dextrose 5 % 50 mL IVPB  Status:  Discontinued     2 g 100 mL/hr over 30 Minutes Intravenous  Once 02/18/15 1746 02/18/15 2026      Medications:  Scheduled: . aztreonam  2 g Intravenous 3 times per day  . baclofen  10 mg Oral BID WC  . baclofen  20 mg Oral QHS  . feeding supplement (ENSURE ENLIVE)  237 mL Oral BID BM  . feeding supplement (GLUCERNA SHAKE)  237 mL Oral TID BM  . fentaNYL  25 mcg Transdermal Q72H  . insulin aspart  0-9 Units Subcutaneous TID WC  . insulin glargine  10 Units Subcutaneous Daily  . loratadine  10 mg Oral Daily  . metoCLOPramide  5 mg Oral Daily  . metronidazole  500 mg Intravenous Q8H  . midodrine  10 mg Oral TID WC  . pantoprazole  40 mg Oral Daily  . potassium chloride SA  20 mEq Oral Daily  . pregabalin  100 mg Oral BID  . pregabalin  200 mg Oral QHS  . sodium chloride  10-40 mL Intracatheter Q12H  . vancomycin  500 mg Intravenous Q12H    Objective: Vital signs in last 24 hours: Temp:  [96.8 F (36 C)-97.9 F (36.6 C)] 97.9 F (36.6 C) (05/17 0823) Pulse Rate:  [64-81] 73 (05/17 0823) Resp:  [13-19] 16 (05/17 0823) BP: (84-133)/(61-93) 114/76 mmHg (05/17 0823) SpO2:  [100 %] 100 % (05/17 0823)   General appearance: alert, cooperative and no distress Resp: clear to auscultation bilaterally Cardio: regular rate and rhythm GI: normal findings: bowel sounds normal and soft, non-tender Extremities: RUE [facility]  Lab Results  Recent Labs  02/21/15 0247 02/22/15 0415  WBC 18.0* 18.6*  HGB 10.4* 9.9*  HCT 32.1* 31.1*  NA 137 134*  K 3.9 3.9  CL 116* 111  CO2 16* 18*  BUN 7 6  CREATININE 0.41* 0.46*   Liver Panel  Recent Labs  02/21/15 0247 02/22/15 0415  PROT 6.2* 5.6*  ALBUMIN 1.5* 1.4*  AST 9* 9*  ALT 8* 6*  ALKPHOS 94 101  BILITOT 0.1* 0.3    Sedimentation Rate  Recent Labs  02/21/15 0247  ESRSEDRATE 60*   C-Reactive Protein  Recent Labs  02/21/15 0247  CRP 11.3*    Microbiology: Recent Results (from the past 240 hour(s))  Blood culture (routine x 2)     Status: None (Preliminary result)   Collection Time: 02/18/15  4:15 PM  Result Value Ref Range Status   Specimen Description BLOOD RIGHT ANTECUBITAL  Final   Special Requests BOTTLES DRAWN AEROBIC ONLY 2CC  Final   Culture   Final           BLOOD CULTURE RECEIVED NO GROWTH TO DATE CULTURE WILL BE HELD FOR 5 DAYS BEFORE ISSUING A FINAL NEGATIVE REPORT Performed at [facility]    Report Status PENDING  Incomplete  Blood culture (routine x 2)     Status: None (Preliminary result)   Collection Time: 02/18/15  4:18 PM  Result Value Ref Range Status   Specimen Description BLOOD LEFT ANTECUBITAL  Final   Special Requests BOTTLES DRAWN AEROBIC AND ANAEROBIC 5CC  Final   Culture   Final           BLOOD CULTURE RECEIVED NO GROWTH TO DATE CULTURE WILL BE HELD FOR 5 DAYS BEFORE ISSUING A FINAL NEGATIVE REPORT Performed at [facility]    Report Status PENDING  Incomplete  MRSA PCR Screening     Status: None   Collection Time: 02/18/15  8:33 PM  Result Value Ref Range Status   MRSA by PCR NEGATIVE NEGATIVE Final    Comment:        The GeneXpert MRSA Assay (FDA approved for NASAL specimens only), is one component of a comprehensive MRSA colonization surveillance program. It is not intended to diagnose MRSA infection nor to guide or monitor treatment for MRSA infections.   Wound culture     Status: None (Preliminary result)   Collection Time: 02/21/15  6:32 AM  Result Value Ref Range Status   Specimen Description WOUND SACRAL  Final   Special Requests Normal  Final   Gram Stain   Final    NO WBC SEEN NO SQUAMOUS EPITHELIAL CELLS SEEN FEW GRAM VARIABLE ROD Performed at [facility]    Culture PENDING  Incomplete   Report Status  PENDING  Incomplete  Studies/Results: No results found.   Assessment/Plan: Acute B PE Fever Decubitus Osteomyelitis DM1, uncontrolled, mild DKA Severe Protein Calorie Malnutrition Hyponaremia  Total days of antibiotics: 4 vanco/aztreonam/flagyl  He is doing much better WBC remains elevated Would plan on 6 weeks of his current anbx with f/u in ID clinic Await Dr Leafy Ro input.           [name] Infectious Diseases (pager) [phone] [email or web address] 02/22/2015, 9:02 AM  LOS: 4 days

## 2015-02-22 NOTE — Progress Notes (Signed)
[facility] for Heparin  Indication: pulmonary embolus  Allergies  Allergen Reactions  . Cefuroxime Axetil Anaphylaxis  . Ertapenem Other (See Comments)    Rash and confusion  . Morphine And Related Other (See Comments)    Changed mental status, confusion, headache, visual hallucination  . Penicillins Anaphylaxis and Other (See Comments)    ?can take amoxicillin?  . Sulfa Antibiotics Anaphylaxis, Shortness Of Breath and Other (See Comments)  . Tessalon [Benzonatate] Anaphylaxis  . Shellfish Allergy Itching and Other (See Comments)    Took benadryl to alleviate reaction  . Miripirium Rash and Other (See Comments)    Change in mental status    Patient Measurements: Height: 5\' 8"  (172.7 cm) Weight: 111 lb 15.9 oz (50.8 kg) IBW/kg (Calculated) : 68.4  Heparin dosing weight is 50.8 kg  Vital Signs: Temp: 97.1 F (36.2 C) (05/17 0400) Temp Source: Axillary (05/17 0400) BP: 106/79 mmHg (05/17 0400) Pulse Rate: 79 (05/17 0400)  Labs:  Recent Labs  02/19/15 1327 02/20/15 0456 02/20/15 0458 02/20/15 1214  02/20/15 1824  02/21/15 0247 02/21/15 0725 02/21/15 1805 02/22/15 0415 02/22/15 0419  HGB 10.0* 9.9*  --   --   --   --   --  10.4*  --   --  9.9*  --   HCT 30.9* 30.7*  --   --   --   --   --  32.1*  --   --  31.1*  --   PLT 779* 767*  --   --   --   --   --  891*  --   --  881*  --   HEPARINUNFRC  --   --   --   --   < >  --   < >  --  0.33 0.39  --  0.13*  CREATININE 0.52* 0.52*  --   --   --   --   --  0.41*  --   --   --   --   TROPONINI  --   --  <0.03 <0.03  --  <0.03  --   --   --   --   --   --   < > = values in this interval not displayed.  Estimated Creatinine Clearance: 97 mL/min (by C-G formula based on Cr of 0.41).  Assessment: 29 year old male with PE for heparin.  Goal of Therapy:  Heparin level 0.3-0.7 units/ml Monitor platelets by anticoagulation protocol: Yes   Plan:  Heparin 1500 units IV bolus, then  increase heparin 1400 units/hr Check heparin level in 6 hours.   Phillis Knack, PharmD, BCPS

## 2015-02-22 NOTE — Progress Notes (Signed)
[facility] for Heparin  Indication: pulmonary embolus  Allergies  Allergen Reactions  . Cefuroxime Axetil Anaphylaxis  . Ertapenem Other (See Comments)    Rash and confusion  . Morphine And Related Other (See Comments)    Changed mental status, confusion, headache, visual hallucination  . Penicillins Anaphylaxis and Other (See Comments)    ?can take amoxicillin?  . Sulfa Antibiotics Anaphylaxis, Shortness Of Breath and Other (See Comments)  . Tessalon [Benzonatate] Anaphylaxis  . Shellfish Allergy Itching and Other (See Comments)    Took benadryl to alleviate reaction  . Miripirium Rash and Other (See Comments)    Change in mental status    Patient Measurements: Height: 5\' 8"  (172.7 cm) Weight: 111 lb 15.9 oz (50.8 kg) IBW/kg (Calculated) : 68.4  Heparin dosing weight is 50.8 kg  Vital Signs: Temp: 98.1 F (36.7 C) (05/17 1618) Temp Source: Oral (05/17 1618) BP: 118/82 mmHg (05/17 1618) Pulse Rate: 80 (05/17 1618)  Labs:  Recent Labs  02/20/15 0456 02/20/15 0458 02/20/15 1214  02/20/15 1824  02/21/15 0247  02/22/15 0415 02/22/15 0419 02/22/15 1200 02/22/15 1855  HGB 9.9*  --   --   --   --   --  10.4*  --  9.9*  --   --   --   HCT 30.7*  --   --   --   --   --  32.1*  --  31.1*  --   --   --   PLT 767*  --   --   --   --   --  891*  --  881*  --   --   --   HEPARINUNFRC  --   --   --   < >  --   < >  --   < >  --  0.13* 0.53 0.30  CREATININE 0.52*  --   --   --   --   --  0.41*  --  0.46*  --   --   --   TROPONINI  --  <0.03 <0.03  --  <0.03  --   --   --   --   --   --   --   < > = values in this interval not displayed.  Estimated Creatinine Clearance: 97 mL/min (by C-G formula based on Cr of 0.46).  Assessment: 29 year old male with PE for heparin.   Repeat heparin level therapeutic at 0.3, but drifting downward. CBC low-stable. No bleeding reported.   Goal of Therapy:  Heparin level 0.3-0.7 units/ml Monitor  platelets by anticoagulation protocol: Yes   Plan:  Increase IV heparin to 1500 units/hr to keep in therapeutic range. Recheck with AM labs. Daily heparin level and CBC while on therapy.  Uvaldo Rising, BCPS  Clinical Pharmacist Pager [phone]  02/22/2015 7:48 PM

## 2015-02-22 NOTE — Progress Notes (Signed)
[facility] for Heparin  Indication: pulmonary embolus  Allergies  Allergen Reactions  . Cefuroxime Axetil Anaphylaxis  . Ertapenem Other (See Comments)    Rash and confusion  . Morphine And Related Other (See Comments)    Changed mental status, confusion, headache, visual hallucination  . Penicillins Anaphylaxis and Other (See Comments)    ?can take amoxicillin?  . Sulfa Antibiotics Anaphylaxis, Shortness Of Breath and Other (See Comments)  . Tessalon [Benzonatate] Anaphylaxis  . Shellfish Allergy Itching and Other (See Comments)    Took benadryl to alleviate reaction  . Miripirium Rash and Other (See Comments)    Change in mental status    Patient Measurements: Height: 5\' 8"  (172.7 cm) Weight: 111 lb 15.9 oz (50.8 kg) IBW/kg (Calculated) : 68.4  Heparin dosing weight is 50.8 kg  Vital Signs: Temp: 97.9 F (36.6 C) (05/17 1237) Temp Source: Oral (05/17 1237) BP: 132/87 mmHg (05/17 1237) Pulse Rate: 92 (05/17 1237)  Labs:  Recent Labs  02/20/15 0456 02/20/15 0458 02/20/15 1214  02/20/15 1824  02/21/15 0247  02/21/15 1805 02/22/15 0415 02/22/15 0419 02/22/15 1200  HGB 9.9*  --   --   --   --   --  10.4*  --   --  9.9*  --   --   HCT 30.7*  --   --   --   --   --  32.1*  --   --  31.1*  --   --   PLT 767*  --   --   --   --   --  891*  --   --  881*  --   --   HEPARINUNFRC  --   --   --   < >  --   < >  --   < > 0.39  --  0.13* 0.53  CREATININE 0.52*  --   --   --   --   --  0.41*  --   --  0.46*  --   --   TROPONINI  --  <0.03 <0.03  --  <0.03  --   --   --   --   --   --   --   < > = values in this interval not displayed.  Estimated Creatinine Clearance: 97 mL/min (by C-G formula based on Cr of 0.46).  Assessment: 29 year old male with PE for heparin.   Repeat heparin level therapeutic at 0.53. CBC low-stable. No bleeding reported.   Goal of Therapy:  Heparin level 0.3-0.7 units/ml Monitor platelets by anticoagulation  protocol: Yes   Plan:  Continue heparin at 1400 units/hr Recheck heparin level in 6 hours to confirm.  Daily heparin level and CBC while on therapy.  Sloan Leiter, PharmD, BCPS Clinical Pharmacist [phone] 02/22/2015, 1:09 PM

## 2015-02-23 DIAGNOSIS — G4700 Insomnia, unspecified: Secondary | ICD-10-CM

## 2015-02-23 LAB — UIFE/LIGHT CHAINS/TP QN, 24-HR UR
% BETA, URINE: 23 %
ALPHA 1 URINE: 2.3 %
Albumin, U: 32.1 %
Alpha 2, Urine: 16.8 %
Free Kappa/Lambda Ratio: 7.36 (ref 2.04–10.37)
Free Lambda Lt Chains,Ur: 76.5 mg/L — ABNORMAL HIGH (ref 0.24–6.66)
Free Lt Chn Excr Rate: 563 mg/L — ABNORMAL HIGH (ref 1.35–24.19)
GAMMA GLOBULIN URINE: 25.7 %
Total Protein, Urine: 52.6 mg/dL

## 2015-02-23 LAB — COMPREHENSIVE METABOLIC PANEL
ALBUMIN: 1.4 g/dL — AB (ref 3.5–5.0)
ALK PHOS: 98 U/L (ref 38–126)
ALT: 7 U/L — AB (ref 17–63)
ANION GAP: 5 (ref 5–15)
AST: 8 U/L — AB (ref 15–41)
BUN: 6 mg/dL (ref 6–20)
CO2: 18 mmol/L — ABNORMAL LOW (ref 22–32)
Calcium: 8.7 mg/dL — ABNORMAL LOW (ref 8.9–10.3)
Chloride: 111 mmol/L (ref 101–111)
Creatinine, Ser: 0.53 mg/dL — ABNORMAL LOW (ref 0.61–1.24)
GFR calc Af Amer: >60 mL/min (ref >60)
GFR calc non Af Amer: >60 mL/min (ref >60)
Glucose, Bld: 265 mg/dL — ABNORMAL HIGH (ref 65–99)
POTASSIUM: 3.6 mmol/L (ref 3.5–5.1)
SODIUM: 134 mmol/L — AB (ref 135–145)
TOTAL PROTEIN: 5.5 g/dL — AB (ref 6.5–8.1)
Total Bilirubin: 0.3 mg/dL (ref 0.3–1.2)

## 2015-02-23 LAB — CBC
HCT: 28.8 % — ABNORMAL LOW (ref 39.0–52.0)
Hemoglobin: 9.3 g/dL — ABNORMAL LOW (ref 13.0–17.0)
MCH: 26.1 pg (ref 26.0–34.0)
MCHC: 32.3 g/dL (ref 30.0–36.0)
MCV: 80.7 fL (ref 78.0–100.0)
PLATELETS: 836 10*3/uL — AB (ref 150–400)
RBC: 3.57 MIL/uL — ABNORMAL LOW (ref 4.22–5.81)
RDW: 15.3 % (ref 11.5–15.5)
WBC: 16 10*3/uL — ABNORMAL HIGH (ref 4.0–10.5)

## 2015-02-23 LAB — GLUCOSE, CAPILLARY
GLUCOSE-CAPILLARY: 231 mg/dL — AB (ref 65–99)
GLUCOSE-CAPILLARY: 195 mg/dL — AB (ref 65–99)
Glucose-Capillary: 249 mg/dL — ABNORMAL HIGH (ref 65–99)
Glucose-Capillary: 220 mg/dL — ABNORMAL HIGH (ref 65–99)
Glucose-Capillary: 221 mg/dL — ABNORMAL HIGH (ref 65–99)
Glucose-Capillary: 240 mg/dL — ABNORMAL HIGH (ref 65–99)

## 2015-02-23 LAB — MAGNESIUM: Magnesium: 1.3 mg/dL — ABNORMAL LOW (ref 1.7–2.4)

## 2015-02-23 LAB — HEPARIN LEVEL (UNFRACTIONATED): HEPARIN UNFRACTIONATED: 0.44 [IU]/mL (ref 0.30–0.70)

## 2015-02-23 LAB — VANCOMYCIN, TROUGH: Vancomycin Tr: 12 ug/mL (ref 10.0–20.0)

## 2015-02-23 MED ORDER — VANCOMYCIN HCL IN DEXTROSE 750-5 MG/150ML-% IV SOLN
750.0000 mg | Freq: Two times a day (BID) | INTRAVENOUS | Status: DC
  Administered 2015-02-23 – 2015-03-06 (×22): 750 mg via INTRAVENOUS
  Filled 2015-02-23 (×26): qty 150

## 2015-02-23 MED ORDER — MIRTAZAPINE 7.5 MG PO TABS
7.5000 mg | ORAL_TABLET | Freq: Every day | ORAL | Status: DC
  Administered 2015-02-23 – 2015-03-05 (×11): 7.5 mg via ORAL
  Filled 2015-02-23 (×12): qty 1

## 2015-02-23 MED ORDER — PREGABALIN 100 MG PO CAPS
100.0000 mg | ORAL_CAPSULE | Freq: Three times a day (TID) | ORAL | Status: DC
  Administered 2015-02-24 – 2015-03-06 (×31): 100 mg via ORAL
  Filled 2015-02-23 (×30): qty 1

## 2015-02-23 NOTE — Progress Notes (Signed)
Spoke with Ms. Mifflin today. They are both very happy and in good spirits. His only complaints are pain (he says the fentanyl works well), sleep is a problem, and he is wanting to go home soon. Provided support. They are requesting Remeron qhs as they say this has greater helped him (especially with sleep) in the past.   Vinie Sill, NP Palliative Medicine Team Pager # [phone] (M-F 8a-5p) Team Phone # [phone] (Nights/Weekends)

## 2015-02-23 NOTE — Progress Notes (Addendum)
[facility] NOTE  Pharmacy Consult for Heparin ; Vancomycin Indication: pulmonary embolus ; chronic osteomyelitis  Allergies  Allergen Reactions  . Cefuroxime Axetil Anaphylaxis  . Ertapenem Other (See Comments)    Rash and confusion  . Morphine And Related Other (See Comments)    Changed mental status, confusion, headache, visual hallucination  . Penicillins Anaphylaxis and Other (See Comments)    ?can take amoxicillin?  . Sulfa Antibiotics Anaphylaxis, Shortness Of Breath and Other (See Comments)  . Tessalon [Benzonatate] Anaphylaxis  . Shellfish Allergy Itching and Other (See Comments)    Took benadryl to alleviate reaction  . Miripirium Rash and Other (See Comments)    Change in mental status    Patient Measurements: Height: 5\' 8"  (172.7 cm) Weight: 111 lb 15.9 oz (50.8 kg) IBW/kg (Calculated) : 68.4  Heparin dosing weight is 50.8 kg  Vital Signs: Temp: 97.9 F (36.6 C) (05/18 1200) Temp Source: Oral (05/18 0800) BP: 111/70 mmHg (05/18 1200) Pulse Rate: 80 (05/18 1200)  Labs:  Recent Labs  02/20/15 1824  02/21/15 0247  02/22/15 0415  02/22/15 1200 02/22/15 1855 02/23/15 0526  HGB  --   < > 10.4*  --  9.9*  --   --   --  9.3*  HCT  --   --  32.1*  --  31.1*  --   --   --  28.8*  PLT  --   --  891*  --  881*  --   --   --  836*  HEPARINUNFRC  --   < >  --   < >  --   < > 0.53 0.30 0.44  CREATININE  --   --  0.41*  --  0.46*  --   --   --  0.53*  TROPONINI <0.03  --   --   --   --   --   --   --   --   < > = values in this interval not displayed.  Estimated Creatinine Clearance: 97 mL/min (by C-G formula based on Cr of 0.53).  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 Heparin level is therapeutic at 0.44 on 1500 units/hr.   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 Wound and blood cultures remain pending.   Vancomycin trough is 12  but drawn 1.5hr late for extrapolated trough of 13.9- sub-therapeutic for goal 15-20. SCr falsely low due to low muscle mass. Great UOP at 2.6 cc/kg/hr recorded.   Goal of Therapy:  Heparin level 0.3-0.7 units/ml Monitor platelets by anticoagulation protocol: Yes  Vancomycin trough 15-20   Plan:  Continue heparin at 1500 units/hr Daily heparin level and CBC while on therapy. Increase vancomycin to 750 mg IV q12.  Monitor renal function and need for repeat level. May accumulate and need to back down due to low muscle mass.   Sloan Leiter, PharmD, BCPS Clinical Pharmacist [phone] 02/23/2015, 1:45 PM

## 2015-02-23 NOTE — Progress Notes (Signed)
Received report from [place], [place] from [facility] for transfer of pt to [facility].

## 2015-02-23 NOTE — Progress Notes (Signed)
[facility] PROGRESS NOTE  Jason [name] OIZ:[id number] DOB: 31-Aug-1986 DOA: 02/18/2015 PCP: [name], MD  Assessment/Plan: Active Problems:   Osteomyelitis   Anemia   DKA (diabetic ketoacidoses)   Infected wound   Bilateral pulmonary embolism   Palliative care encounter   Pulmonary embolus  Acute Hypoxemic respiratory failure -CT angiogram shows Acute pulmonary embolus involving bilateral lower lobes with extensive consolidation of the bilateral lower lobes as described. The consolidations are most likely impart due to pulmonary infarction but underlying pneumonia is not excluded. 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 Johnnye Sima consulted -Last debridement. 12/08/14 by Dr. Migdalia Dk, she has been consulted during this admission -Waiting on further recommendations; either further debridement or hydrotherapy -Wound care consultation completed, see note from 5/16 -Continue Air mattress -Currently on Aztreonam, Tygacil, metronidazole -day #5 as per ID rec's  Chronic hypotension: Continue midodrine, dose increased during this admission to help maintaining BP  Hyponatremia, sodium has improved after sugar control and IVF's -patient was not eating much at home -advise to keep himself well hydrated and to maintain good PO intake  Severe Protein calorie malnutrition: will follow rec's from dietitian  Chronic anemia: -Without evidence of acute GI bleeding. But, with high concerns of chronic bleeding from decubitus ulcer -Had 1 unit of PRBC on 3/8.  -Patient received 2 units on 5/14 -will follow Hgb  trend -Transfuse for active bleeding or hemoglobin less than 7  Uncontrolled DM 1: Mild DKA with metabolic acidosis on admission- -refused insulin drip this admission, accepted to take Lantus and sliding scale insulin -patient acidosis and mild DKA now resolved -A1C 8.1 -discussed with mother and patient regarding importance on sugar control and medication compliance -will monitor and continue adjusting hypoglycemic regimen as needed   Neurogenic bladder:  -Had Klebsiella and BURKHOLDERIA UTI on previous admissions; no dysuria -will monitor -currently on broad spectrum antibiotics  Spinal cord infarction and quadriplegia: -Patient awake alert and oriented 3. -complaining of some neuropathy (which he has hx of, especially with diabetes and spinal cord injury) -will resume lyrica  Insomnia: will start Remeron  S/p Diverting Colostomy and suprapubic catheter  Reactive thrombocytosis: on heparin drip.  Code Status: full Family Communication: discussed with mother at bedside Disposition Plan: Overall prognosis is extremely grim, plastic surgery consultation and final recommendations pending    Consultants:  Infectious disease  Palliative Care  Procedures:  None  Antibiotics: Tygacil, Aztreonam, vancomycin and flagyl  5/13>>  HPI/Subjective: Patient is AAOX3, no fever and in good spirits. Reports main complaints are pain in her back and also some insomnia.  Objective: Filed Vitals:   02/23/15 1400 02/23/15 1500 02/23/15 1600 02/23/15 1618  BP: 107/81  94/70 107/86  Pulse: 75 77 81 81  Temp:      TempSrc:      Resp: 16 16 17 19   Height:      Weight:      SpO2: 100% 99% 100% 100%    Intake/Output Summary (Last 24 hours) at 02/23/15 1745 Last data filed at 02/23/15 1600  Gross per 24 hour  Intake 2257.1 ml  Output   4700 ml  Net -2442.9 ml    Exam: Gen: no acute complaints, afebrile and feeling  better. Reports main complaint is pain (but endorses  fentanyl works ok) and insomnia. Lungs: Clear to auscultation bilaterally without wheezes or crackles; good air movement Cardiovascular: Regular rate and rhythm without murmur gallop or rub normal S1 and S2 Abdomen: Nontender, nondistended, soft, bowel sounds positive, no rebound, no ascites, no appreciable mass Extremities: No significant cyanosis, clubbing, or edema bilateral lower extremities Neurological:alert to place and person; + quadriplegia  Skin: Multiple deep wounds to right buttock and perineal region. Large pressure wound to mid back. Wounds w mild, malodorous, yellowish-green drainage. No surrounding cellulitis appreciated. No fluctuance/abscess noted. No crepitus. Some necrotic tissue seen.   Data Reviewed: Basic Metabolic Panel:  Recent Labs Lab 02/18/15 2049  02/19/15 1327 02/20/15 0456 02/20/15 1214 02/21/15 0247 02/22/15 0415 02/23/15 0526  NA 129*  < > 131* 130*  --  137 134* 134*  K 3.7  < > 4.2 4.6  --  3.9 3.9 3.6  CL 102  < > 108 109  --  116* 111 111  CO2 17*  < > 16* 15*  --  16* 18* 18*  GLUCOSE 249*  < > 180* 207*  --  70 187* 265*  BUN 12  < > 9 10  --  7 6 6   CREATININE 0.61  < > 0.52* 0.52*  --  0.41* 0.46* 0.53*  CALCIUM 9.9  < > 9.6 9.1  --  9.5 9.0 8.7*  MG 1.1*  --   --   --  3.0* 1.9  --  1.3*  PHOS 2.6  --   --   --   --   --   --   --   < > = values in this interval not displayed.  Liver Function Tests:  Recent Labs Lab 02/18/15 1615 02/20/15 0456 02/21/15 0247 02/22/15 0415 02/23/15 0526  AST 9* 16 9* 9* 8*  ALT 12* 9* 8* 6* 7*  ALKPHOS 114 105 94 101 98  BILITOT 0.6 1.1 0.1* 0.3 0.3  PROT 7.1 5.7* 6.2* 5.6* 5.5*  ALBUMIN 2.0* 1.4* 1.5* 1.4* 1.4*   CBC:  Recent Labs Lab 02/18/15 1615 02/19/15 1327 02/20/15 0456 02/21/15 0247 02/22/15 0415 02/23/15 0526  WBC 17.6* 19.0* 21.3* 18.0* 18.6* 16.0*  NEUTROABS 11.6*  --   --   --   --   --   HGB 7.2* 10.0* 9.9* 10.4* 9.9* 9.3*  HCT 23.6* 30.9* 30.7* 32.1* 31.1* 28.8*  MCV  79.5 80.9 81.2 80.5 80.8 80.7  PLT 812* 779* 767* 891* 881* 836*    Cardiac Enzymes:  Recent Labs Lab 02/20/15 0458 02/20/15 1214 02/20/15 1824  TROPONINI <0.03 <0.03 <0.03   CBG:  Recent Labs Lab 02/23/15 0013 02/23/15 0307 02/23/15 0855 02/23/15 1255 02/23/15 1616  GLUCAP 249* 231* 220* 195* 221*    Recent Results (from the past 240 hour(s))  Blood culture (routine x 2)     Status: None (Preliminary result)   Collection Time: 02/18/15  4:15 PM  Result Value Ref Range Status   Specimen Description BLOOD RIGHT ANTECUBITAL  Final   Special Requests BOTTLES DRAWN AEROBIC ONLY 2CC  Final   Culture   Final           BLOOD CULTURE RECEIVED NO GROWTH TO DATE CULTURE WILL BE HELD FOR 5 DAYS BEFORE ISSUING A FINAL NEGATIVE REPORT Performed at [facility]    Report Status PENDING  Incomplete  Blood culture (routine x 2)     Status: None (Preliminary result)  Collection Time: 02/18/15  4:18 PM  Result Value Ref Range Status   Specimen Description BLOOD LEFT ANTECUBITAL  Final   Special Requests BOTTLES DRAWN AEROBIC AND ANAEROBIC 5CC  Final   Culture   Final           BLOOD CULTURE RECEIVED NO GROWTH TO DATE CULTURE WILL BE HELD FOR 5 DAYS BEFORE ISSUING A FINAL NEGATIVE REPORT Performed at [facility]    Report Status PENDING  Incomplete  MRSA PCR Screening     Status: None   Collection Time: 02/18/15  8:33 PM  Result Value Ref Range Status   MRSA by PCR NEGATIVE NEGATIVE Final    Comment:        The GeneXpert MRSA Assay (FDA approved for NASAL specimens only), is one component of a comprehensive MRSA colonization surveillance program. It is not intended to diagnose MRSA infection nor to guide or monitor treatment for MRSA infections.   Wound culture     Status: None (Preliminary result)   Collection Time: 02/21/15  6:32 AM  Result Value Ref Range Status   Specimen Description WOUND SACRAL  Final   Special Requests Normal  Final   Gram Stain    Final    NO WBC SEEN NO SQUAMOUS EPITHELIAL CELLS SEEN FEW GRAM VARIABLE ROD Performed at [facility]   Final    Culture reincubated for better growth Performed at [facility]    Report Status PENDING  Incomplete     Studies: Ct Angio Chest Pe W/cm &/or Wo Cm  02/20/2015   CLINICAL DATA:  Acute respiratory failure, hypoxia, shortness of breath, chest pain.  EXAM: CT ANGIOGRAPHY CHEST WITH CONTRAST  TECHNIQUE: Multidetector CT imaging of the chest was performed using the standard protocol during bolus administration of intravenous contrast. Multiplanar CT image reconstructions and MIPs were obtained to evaluate the vascular anatomy.  CONTRAST:  63mL OMNIPAQUE IOHEXOL 350 MG/ML SOLN  COMPARISON:  January 11, 2015  FINDINGS: There is acute pulmonary embolus in segmental and subsegmental pulmonary arteries of the left lower lobe. There is acute pulmonary embolus in subsegmental pulmonary arteries of the right lower lobe. There are extensive consolidation of the right lower lobe and the medial aspect of the left lower lobe. There are small bilateral pleural effusions. There are lymphadenopathy in the mediastinum. The aorta is normal. There is small pericardial effusion. The visualized upper abdominal structures are normal.  Review of the MIP images confirms the above findings.  IMPRESSION: Acute pulmonary embolus involving bilateral lower lobes with extensive consolidation of the bilateral lower lobes as described. The consolidations are most likely impart due to pulmonary infarction but underlying pneumonia is not excluded.  Critical Value/emergent results were called by telephone at the time of interpretation on 02/20/2015 at 8:33 am to patient's nurse Clarisa Fling who verbally acknowledged these results.   Electronically Signed   By: Abelardo Diesel M.D.   On: 02/20/2015 08:40   Mr Lumbar Spine Wo Contrast  02/19/2015   CLINICAL DATA:  Infected sacral decubitus ulcers.  Fever. Leukocytosis.  EXAM: MRI LUMBAR SPINE WITHOUT CONTRAST, MRI OF THE SACRUM WITH AND WITHOUT CONTRAST  TECHNIQUE: Multiplanar, multisequence MR imaging of the lumbar spine and sacrum was performed. Contrast was performed for the sacral imaging.  CONTRAST:  10 cc MultiHance  COMPARISON:  CT scan of the abdomen and pelvis dated 01/11/2015 and MRI of the pelvis dated 01/08/2015  FINDINGS: MRI of the lumbar spine:  Normal conus tip at L1-2. There is diffuse edema in the posterior paraspinal musculature and in the gluteal muscles. There is also atrophy of the paraspinal musculature. The patient is quadriplegic. The paraspinal soft tissues are otherwise normal.  The discs from T11-12 through L5-S1 are normal. There is low signal throughout the bones of the lumbar spine which may represent a red marrow reactivation. There is no evidence of spinal infection. No spinal or foraminal stenosis or facet arthritis.  MRI of the sacrum:  There is abnormal edema and enhancement of the right ischial tuberosity. There is a deep decubitus ulcer that extends to the ischial tuberosity and there is a small abscess extending to enhance cm laterally from the decubitus ulcer at the ischial tuberosity. This pus collection does communicate with the decubitus ulcer.  There is devitalized soft tissue superficial to the left ischial tuberosity at the site of another decubitus ulcer. There is also devitalized soft tissue at the site of the soft tissue ulcer overlying the sacrum. There is slight edema and enhancement of the remnant of the fifth sacral segment and of the entire fourth thick sacral segment but there is no bone destruction.  No evidence of epidural infection in the sacrum.  There is abnormal subcutaneous edema throughout the pelvis with muscle atrophy.  IMPRESSION: 1. Multiple sacral and ischial tuberosity decubitus ulcers. Pus extends from the depths of the right ischial tuberosity ulcer 2.5 cm laterally. 2. Abnormal edema and  enhancement of the right ischial tuberosity with loss of the cortical margin, consistent with osteomyelitis. 3. Abnormal edema and enhancement of the fourth sacral segment and of the remnant of the fifth sacral segment. This is immediately adjacent to a sacral decubitus ulcer. This is not definitive for osteomyelitis however.   Electronically Signed   By: Lorriane Shire M.D.   On: 02/19/2015 13:16   Mr Sacrum/si Joints W Wo Contrast  02/19/2015   CLINICAL DATA:  Infected sacral decubitus ulcers. Fever. Leukocytosis.  EXAM: MRI LUMBAR SPINE WITHOUT CONTRAST, MRI OF THE SACRUM WITH AND WITHOUT CONTRAST  TECHNIQUE: Multiplanar, multisequence MR imaging of the lumbar spine and sacrum was performed. Contrast was performed for the sacral imaging.  CONTRAST:  10 cc MultiHance  COMPARISON:  CT scan of the abdomen and pelvis dated 01/11/2015 and MRI of the pelvis dated 01/08/2015  FINDINGS: MRI of the lumbar spine:  Normal conus tip at L1-2. There is diffuse edema in the posterior paraspinal musculature and in the gluteal muscles. There is also atrophy of the paraspinal musculature. The patient is quadriplegic. The paraspinal soft tissues are otherwise normal.  The discs from T11-12 through L5-S1 are normal. There is low signal throughout the bones of the lumbar spine which may represent a red marrow reactivation. There is no evidence of spinal infection. No spinal or foraminal stenosis or facet arthritis.  MRI of the sacrum:  There is abnormal edema and enhancement of the right ischial tuberosity. There is a deep decubitus ulcer that extends to the ischial tuberosity and there is a small abscess extending to enhance cm laterally from the decubitus ulcer at the ischial tuberosity. This pus collection does communicate with the decubitus ulcer.  There is devitalized soft tissue superficial to the left ischial tuberosity at the site of another decubitus ulcer. There is also devitalized soft tissue at the site of the soft  tissue ulcer overlying the sacrum. There is slight edema and enhancement of the remnant of the fifth sacral segment and of the entire fourth  thick sacral segment but there is no bone destruction.  No evidence of epidural infection in the sacrum.  There is abnormal subcutaneous edema throughout the pelvis with muscle atrophy.  IMPRESSION: 1. Multiple sacral and ischial tuberosity decubitus ulcers. Pus extends from the depths of the right ischial tuberosity ulcer 2.5 cm laterally. 2. Abnormal edema and enhancement of the right ischial tuberosity with loss of the cortical margin, consistent with osteomyelitis. 3. Abnormal edema and enhancement of the fourth sacral segment and of the remnant of the fifth sacral segment. This is immediately adjacent to a sacral decubitus ulcer. This is not definitive for osteomyelitis however.   Electronically Signed   By: Lorriane Shire M.D.   On: 02/19/2015 13:16   Dg Chest Port 1 View  02/20/2015   CLINICAL DATA:  Patient woke up this morning with chest pain and shortness of breath.  EXAM: PORTABLE CHEST - 1 VIEW  COMPARISON:  CT chest 01/11/2015.  Chest 01/10/2015.  FINDINGS: Borderline heart size with normal pulmonary vascularity. Patchy bilateral basilar airspace disease is improving since previous study. Small bilateral pleural effusions. Right PICC catheter tip is over the cavoatrial junction. No pneumothorax. Old fracture deformity of the right clavicle.  IMPRESSION: Basilar airspace infiltrates are improving since previous study. Small bilateral pleural effusions.   Electronically Signed   By: Lucienne Capers M.D.   On: 02/20/2015 05:42    Scheduled Meds: . aztreonam  2 g Intravenous 3 times per day  . baclofen  10 mg Oral BID WC  . baclofen  20 mg Oral QHS  . feeding supplement (ENSURE ENLIVE)  237 mL Oral BID BM  . feeding supplement (GLUCERNA SHAKE)  237 mL Oral TID BM  . fentaNYL  25 mcg Transdermal Q72H  . insulin aspart  0-9 Units Subcutaneous TID WC  .  insulin glargine  10 Units Subcutaneous Daily  . loratadine  10 mg Oral Daily  . metoCLOPramide  5 mg Oral Daily  . metronidazole  500 mg Intravenous Q8H  . midodrine  10 mg Oral TID WC  . mirtazapine  7.5 mg Oral QHS  . pantoprazole  40 mg Oral Daily  . potassium chloride SA  20 mEq Oral Daily  . pregabalin  100 mg Oral BID  . pregabalin  200 mg Oral QHS  . sodium chloride  10-40 mL Intracatheter Q12H  . vancomycin  750 mg Intravenous Q12H   Continuous Infusions: . dextrose 5 % and 0.9% NaCl 75 mL/hr at 02/23/15 0505  . heparin 1,500 Units/hr (02/23/15 1600)    Active Problems:   Osteomyelitis   Anemia   DKA (diabetic ketoacidoses)   Infected wound   Bilateral pulmonary embolism   Palliative care encounter   Pulmonary embolus    Time spent: 35 minutes   Barton Dubois  [facility] Pager [phone]. If 7PM-7AM, please contact night-coverage at [email or web address], password [facility] 02/23/2015, 5:45 PM  LOS: 5 days

## 2015-02-23 NOTE — Progress Notes (Signed)
INFECTIOUS DISEASE PROGRESS NOTE  ID: Jason [name] is a 29 y.o. male with  Active Problems:   Osteomyelitis   Anemia   DKA (diabetic ketoacidoses)   Infected wound   Bilateral pulmonary embolism   Palliative care encounter   Pulmonary embolus  Subjective: Without complaints  Abtx:  Anti-infectives    Start     Dose/Rate Route Frequency Ordered Stop   02/20/15 2300  vancomycin (VANCOCIN) 500 mg in sodium chloride 0.9 % 100 mL IVPB     500 mg 100 mL/hr over 60 Minutes Intravenous Every 12 hours 02/20/15 1018     02/20/15 1400  aztreonam (AZACTAM) 2 g in dextrose 5 % 50 mL IVPB     2 g 100 mL/hr over 30 Minutes Intravenous 3 times per day 02/19/15 1417     02/20/15 1030  vancomycin (VANCOCIN) IVPB 1000 mg/200 mL premix     1,000 mg 200 mL/hr over 60 Minutes Intravenous  Once 02/20/15 1018 02/20/15 1311   02/20/15 0800  metroNIDAZOLE (FLAGYL) IVPB 500 mg     500 mg 100 mL/hr over 60 Minutes Intravenous Every 8 hours 02/19/15 1417     02/20/15 0200  tigecycline (TYGACIL) 50 mg in sodium chloride 0.9 % 100 mL IVPB  Status:  Discontinued     50 mg 200 mL/hr over 30 Minutes Intravenous Every 12 hours 02/19/15 1323 02/19/15 1417   02/19/15 1400  tigecycline (TYGACIL) 100 mg in sodium chloride 0.9 % 100 mL IVPB     100 mg 200 mL/hr over 30 Minutes Intravenous  Once 02/19/15 1323 02/19/15 1526   02/19/15 0800  vancomycin (VANCOCIN) 500 mg in sodium chloride 0.9 % 100 mL IVPB  Status:  Discontinued     500 mg 100 mL/hr over 60 Minutes Intravenous Every 12 hours 02/18/15 1804 02/19/15 1323   02/19/15 0400  aztreonam (AZACTAM) 1 g in dextrose 5 % 50 mL IVPB  Status:  Discontinued     1 g 100 mL/hr over 30 Minutes Intravenous Every 8 hours 02/18/15 1804 02/18/15 2026   02/18/15 2200  aztreonam (AZACTAM) 2 g in dextrose 5 % 50 mL IVPB  Status:  Discontinued     2 g 100 mL/hr over 30 Minutes Intravenous 3 times per day 02/18/15 1745 02/18/15 1745   02/18/15 1800  vancomycin  (VANCOCIN) IVPB 1000 mg/200 mL premix     1,000 mg 200 mL/hr over 60 Minutes Intravenous  Once 02/18/15 1745 02/18/15 1937   02/18/15 1800  aztreonam (AZACTAM) 2 g in dextrose 5 % 50 mL IVPB  Status:  Discontinued     2 g 100 mL/hr over 30 Minutes Intravenous  Once 02/18/15 1746 02/18/15 2026      Medications:  Scheduled: . aztreonam  2 g Intravenous 3 times per day  . baclofen  10 mg Oral BID WC  . baclofen  20 mg Oral QHS  . feeding supplement (ENSURE ENLIVE)  237 mL Oral BID BM  . feeding supplement (GLUCERNA SHAKE)  237 mL Oral TID BM  . fentaNYL  25 mcg Transdermal Q72H  . insulin aspart  0-9 Units Subcutaneous TID WC  . insulin glargine  10 Units Subcutaneous Daily  . loratadine  10 mg Oral Daily  . metoCLOPramide  5 mg Oral Daily  . metronidazole  500 mg Intravenous Q8H  . midodrine  10 mg Oral TID WC  . pantoprazole  40 mg Oral Daily  . potassium chloride SA  20 mEq Oral Daily  .  pregabalin  100 mg Oral BID  . pregabalin  200 mg Oral QHS  . sodium chloride  10-40 mL Intracatheter Q12H  . vancomycin  500 mg Intravenous Q12H    Objective: Vital signs in last 24 hours: Temp:  [97.9 F (36.6 C)-99 F (37.2 C)] 98.7 F (37.1 C) (05/18 0800) Pulse Rate:  [77-101] 77 (05/18 0800) Resp:  [10-19] 16 (05/18 0800) BP: (101-134)/(65-87) 101/67 mmHg (05/18 0800) SpO2:  [100 %] 100 % (05/18 0800)   General appearance: alert and no distress Resp: clear to auscultation bilaterally Cardio: regular rate and rhythm GI: normal findings: bowel sounds normal and soft, non-tender  Lab Results  Recent Labs  02/22/15 0415 02/23/15 0526  WBC 18.6* 16.0*  HGB 9.9* 9.3*  HCT 31.1* 28.8*  NA 134* 134*  K 3.9 3.6  CL 111 111  CO2 18* 18*  BUN 6 6  CREATININE 0.46* 0.53*   Liver Panel  Recent Labs  02/22/15 0415 02/23/15 0526  PROT 5.6* 5.5*  ALBUMIN 1.4* 1.4*  AST 9* 8*  ALT 6* 7*  ALKPHOS 101 98  BILITOT 0.3 0.3   Sedimentation Rate  Recent Labs   02/21/15 0247  ESRSEDRATE 60*   C-Reactive Protein  Recent Labs  02/21/15 0247  CRP 11.3*    Microbiology: Recent Results (from the past 240 hour(s))  Blood culture (routine x 2)     Status: None (Preliminary result)   Collection Time: 02/18/15  4:15 PM  Result Value Ref Range Status   Specimen Description BLOOD RIGHT ANTECUBITAL  Final   Special Requests BOTTLES DRAWN AEROBIC ONLY 2CC  Final   Culture   Final           BLOOD CULTURE RECEIVED NO GROWTH TO DATE CULTURE WILL BE HELD FOR 5 DAYS BEFORE ISSUING A FINAL NEGATIVE REPORT Performed at [facility]    Report Status PENDING  Incomplete  Blood culture (routine x 2)     Status: None (Preliminary result)   Collection Time: 02/18/15  4:18 PM  Result Value Ref Range Status   Specimen Description BLOOD LEFT ANTECUBITAL  Final   Special Requests BOTTLES DRAWN AEROBIC AND ANAEROBIC 5CC  Final   Culture   Final           BLOOD CULTURE RECEIVED NO GROWTH TO DATE CULTURE WILL BE HELD FOR 5 DAYS BEFORE ISSUING A FINAL NEGATIVE REPORT Performed at [facility]    Report Status PENDING  Incomplete  MRSA PCR Screening     Status: None   Collection Time: 02/18/15  8:33 PM  Result Value Ref Range Status   MRSA by PCR NEGATIVE NEGATIVE Final    Comment:        The GeneXpert MRSA Assay (FDA approved for NASAL specimens only), is one component of a comprehensive MRSA colonization surveillance program. It is not intended to diagnose MRSA infection nor to guide or monitor treatment for MRSA infections.   Wound culture     Status: None (Preliminary result)   Collection Time: 02/21/15  6:32 AM  Result Value Ref Range Status   Specimen Description WOUND SACRAL  Final   Special Requests Normal  Final   Gram Stain   Final    NO WBC SEEN NO SQUAMOUS EPITHELIAL CELLS SEEN FEW GRAM VARIABLE ROD Performed at [facility]    Culture   Final    Culture reincubated for better growth Performed at [facility]    Report Status  PENDING  Incomplete    Studies/Results: No results found.   Assessment/Plan: Acute B PE Fever Decubitus Osteomyelitis DM1, uncontrolled, mild DKA Severe Protein Calorie Malnutrition Hyponaremia  Total days of antibiotics: 5 vanco/aztreonam/flagyl  No change in anbx His wond Cx (which may not be predictive of his actual pathogen involving deeper tissues) is still pending.  Pt states he saw Dr Migdalia Dk yesterday.  Possible move out of step down.          [name] Infectious Diseases (pager) [phone] [email or web address] 02/23/2015, 11:04 AM  LOS: 5 days

## 2015-02-24 DIAGNOSIS — L89154 Pressure ulcer of sacral region, stage 4: Secondary | ICD-10-CM

## 2015-02-24 DIAGNOSIS — M533 Sacrococcygeal disorders, not elsewhere classified: Secondary | ICD-10-CM

## 2015-02-24 LAB — CBC
HEMATOCRIT: 29.1 % — AB (ref 39.0–52.0)
HEMOGLOBIN: 9.4 g/dL — AB (ref 13.0–17.0)
MCH: 26 pg (ref 26.0–34.0)
MCHC: 32.3 g/dL (ref 30.0–36.0)
MCV: 80.6 fL (ref 78.0–100.0)
Platelets: 788 10*3/uL — ABNORMAL HIGH (ref 150–400)
RBC: 3.61 MIL/uL — ABNORMAL LOW (ref 4.22–5.81)
RDW: 15.5 % (ref 11.5–15.5)
WBC: 16.1 10*3/uL — AB (ref 4.0–10.5)

## 2015-02-24 LAB — GLUCOSE, CAPILLARY
Glucose-Capillary: 238 mg/dL — ABNORMAL HIGH (ref 65–99)
Glucose-Capillary: 248 mg/dL — ABNORMAL HIGH (ref 65–99)
Glucose-Capillary: 275 mg/dL — ABNORMAL HIGH (ref 65–99)
Glucose-Capillary: 299 mg/dL — ABNORMAL HIGH (ref 65–99)
Glucose-Capillary: 317 mg/dL — ABNORMAL HIGH (ref 65–99)
Glucose-Capillary: 320 mg/dL — ABNORMAL HIGH (ref 65–99)

## 2015-02-24 LAB — WOUND CULTURE
GRAM STAIN: NONE SEEN
SPECIAL REQUESTS: NORMAL

## 2015-02-24 LAB — HEPARIN LEVEL (UNFRACTIONATED): HEPARIN UNFRACTIONATED: 0.39 [IU]/mL (ref 0.30–0.70)

## 2015-02-24 LAB — VITAMIN D 1,25 DIHYDROXY
Vitamin D 1, 25 (OH)2 Total: <10 pg/mL
Vitamin D2 1, 25 (OH)2: <10 pg/mL
Vitamin D3 1, 25 (OH)2: <10 pg/mL

## 2015-02-24 MED ORDER — FENTANYL CITRATE (PF) 100 MCG/2ML IJ SOLN
50.0000 ug | Freq: Once | INTRAMUSCULAR | Status: AC
Start: 2015-02-24 — End: 2015-02-24
  Administered 2015-02-24: 50 ug via INTRAVENOUS
  Filled 2015-02-24: qty 2

## 2015-02-24 MED ORDER — SODIUM CHLORIDE 0.9 % IV SOLN
INTRAVENOUS | Status: DC
  Administered 2015-02-24 – 2015-03-05 (×8): via INTRAVENOUS

## 2015-02-24 MED ORDER — FENTANYL CITRATE (PF) 100 MCG/2ML IJ SOLN
50.0000 ug | INTRAMUSCULAR | Status: DC | PRN
  Administered 2015-02-24 – 2015-03-06 (×35): 50 ug via INTRAVENOUS
  Filled 2015-02-24 (×36): qty 2

## 2015-02-24 MED ORDER — INSULIN GLARGINE 100 UNIT/ML ~~LOC~~ SOLN
15.0000 [IU] | Freq: Every day | SUBCUTANEOUS | Status: DC
  Administered 2015-02-25: 15 [IU] via SUBCUTANEOUS
  Filled 2015-02-24: qty 0.15

## 2015-02-24 MED ORDER — MAGNESIUM OXIDE 400 (241.3 MG) MG PO TABS
400.0000 mg | ORAL_TABLET | Freq: Every day | ORAL | Status: DC
  Administered 2015-02-24 – 2015-03-06 (×11): 400 mg via ORAL
  Filled 2015-02-24 (×11): qty 1

## 2015-02-24 NOTE — Progress Notes (Signed)
Utilization review completed.  

## 2015-02-24 NOTE — Progress Notes (Signed)
Pt arrived to unit alert and oriented x4. Oriented to room, unit, and staff.  Bed in lowest position and call bell is within reach. Will continue to monitor. 

## 2015-02-24 NOTE — Plan of Care (Signed)
Problem: Phase I Progression Outcomes Goal: OOB as tolerated unless otherwise ordered Outcome: Not Applicable Date Met:  47/09/62 Patient is Quadriplegic

## 2015-02-24 NOTE — Progress Notes (Signed)
[place] NOTE  Pharmacy Consult for Heparin Indication: pulmonary embolus  Allergies  Allergen Reactions  . Cefuroxime Axetil Anaphylaxis  . Ertapenem Other (See Comments)    Rash and confusion  . Morphine And Related Other (See Comments)    Changed mental status, confusion, headache, visual hallucination  . Penicillins Anaphylaxis and Other (See Comments)    ?can take amoxicillin?  . Sulfa Antibiotics Anaphylaxis, Shortness Of Breath and Other (See Comments)  . Tessalon [Benzonatate] Anaphylaxis  . Shellfish Allergy Itching and Other (See Comments)    Took benadryl to alleviate reaction  . Miripirium Rash and Other (See Comments)    Change in mental status    Patient Measurements: Height: 5\' 8"  (172.7 cm) Weight: 111 lb 15.9 oz (50.8 kg) IBW/kg (Calculated) : 68.4  Heparin dosing weight is 50.8 kg  Vital Signs: Temp: 97.5 F (36.4 C) (05/19 0748) Temp Source: Oral (05/19 0748) BP: 118/96 mmHg (05/19 0748) Pulse Rate: 74 (05/19 0748)  Labs:  Recent Labs  02/22/15 0415  02/22/15 1855 02/23/15 0526 02/24/15 0639  HGB 9.9*  --   --  9.3* 9.4*  HCT 31.1*  --   --  28.8* 29.1*  PLT 881*  --   --  836* 788*  HEPARINUNFRC  --   < > 0.30 0.44 0.39  CREATININE 0.46*  --   --  0.53*  --   < > = values in this interval not displayed.  Estimated Creatinine Clearance: 97 mL/min (by C-G formula based on Cr of 0.53).  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 Heparin level is therapeutic at 0.39 on 1500 units/hr. H/H remains low but stable. RN reports no s/s of bleeding.    Goal of Therapy:  Heparin level 0.3-0.7 units/ml Monitor platelets by anticoagulation protocol: Yes    Plan:  Continue heparin at 1500 units/hr Daily heparin level and CBC while on therapy. Monitor for s/s of bleeding   Albertina Parr, PharmD., BCPS Clinical Pharmacist Pager [phone]

## 2015-02-24 NOTE — Plan of Care (Signed)
Problem: Phase I Progression Outcomes Goal: Voiding-avoid urinary catheter unless indicated Outcome: Not Applicable Date Met:  59/40/90 Patient has a chronic suprapubic catheter

## 2015-02-24 NOTE — Progress Notes (Signed)
INFECTIOUS DISEASE PROGRESS NOTE  ID: Jason [name] is a 29 y.o. male with  Active Problems:   Osteomyelitis   Anemia   DKA (diabetic ketoacidoses)   Infected wound   Bilateral pulmonary embolism   Palliative care encounter   Pulmonary embolus   Insomnia  Subjective: Wants better pain mgmt  Abtx:  Anti-infectives    Start     Dose/Rate Route Frequency Ordered Stop   02/23/15 1350  vancomycin (VANCOCIN) IVPB 750 mg/150 ml premix     750 mg 150 mL/hr over 60 Minutes Intravenous Every 12 hours 02/23/15 1351     02/20/15 2300  vancomycin (VANCOCIN) 500 mg in sodium chloride 0.9 % 100 mL IVPB  Status:  Discontinued     500 mg 100 mL/hr over 60 Minutes Intravenous Every 12 hours 02/20/15 1018 02/23/15 1351   02/20/15 1400  aztreonam (AZACTAM) 2 g in dextrose 5 % 50 mL IVPB     2 g 100 mL/hr over 30 Minutes Intravenous 3 times per day 02/19/15 1417     02/20/15 1030  vancomycin (VANCOCIN) IVPB 1000 mg/200 mL premix     1,000 mg 200 mL/hr over 60 Minutes Intravenous  Once 02/20/15 1018 02/20/15 1311   02/20/15 0800  metroNIDAZOLE (FLAGYL) IVPB 500 mg     500 mg 100 mL/hr over 60 Minutes Intravenous Every 8 hours 02/19/15 1417     02/20/15 0200  tigecycline (TYGACIL) 50 mg in sodium chloride 0.9 % 100 mL IVPB  Status:  Discontinued     50 mg 200 mL/hr over 30 Minutes Intravenous Every 12 hours 02/19/15 1323 02/19/15 1417   02/19/15 1400  tigecycline (TYGACIL) 100 mg in sodium chloride 0.9 % 100 mL IVPB     100 mg 200 mL/hr over 30 Minutes Intravenous  Once 02/19/15 1323 02/19/15 1526   02/19/15 0800  vancomycin (VANCOCIN) 500 mg in sodium chloride 0.9 % 100 mL IVPB  Status:  Discontinued     500 mg 100 mL/hr over 60 Minutes Intravenous Every 12 hours 02/18/15 1804 02/19/15 1323   02/19/15 0400  aztreonam (AZACTAM) 1 g in dextrose 5 % 50 mL IVPB  Status:  Discontinued     1 g 100 mL/hr over 30 Minutes Intravenous Every 8 hours 02/18/15 1804 02/18/15 2026   02/18/15 2200   aztreonam (AZACTAM) 2 g in dextrose 5 % 50 mL IVPB  Status:  Discontinued     2 g 100 mL/hr over 30 Minutes Intravenous 3 times per day 02/18/15 1745 02/18/15 1745   02/18/15 1800  vancomycin (VANCOCIN) IVPB 1000 mg/200 mL premix     1,000 mg 200 mL/hr over 60 Minutes Intravenous  Once 02/18/15 1745 02/18/15 1937   02/18/15 1800  aztreonam (AZACTAM) 2 g in dextrose 5 % 50 mL IVPB  Status:  Discontinued     2 g 100 mL/hr over 30 Minutes Intravenous  Once 02/18/15 1746 02/18/15 2026      Medications:  Scheduled: . aztreonam  2 g Intravenous 3 times per day  . baclofen  10 mg Oral BID WC  . baclofen  20 mg Oral QHS  . feeding supplement (ENSURE ENLIVE)  237 mL Oral BID BM  . feeding supplement (GLUCERNA SHAKE)  237 mL Oral TID BM  . fentaNYL  25 mcg Transdermal Q72H  . insulin aspart  0-9 Units Subcutaneous TID WC  . insulin glargine  10 Units Subcutaneous Daily  . loratadine  10 mg Oral Daily  . metoCLOPramide  5 mg Oral Daily  . metronidazole  500 mg Intravenous Q8H  . midodrine  10 mg Oral TID WC  . mirtazapine  7.5 mg Oral QHS  . pantoprazole  40 mg Oral Daily  . potassium chloride SA  20 mEq Oral Daily  . pregabalin  100 mg Oral TID WC  . pregabalin  200 mg Oral QHS  . sodium chloride  10-40 mL Intracatheter Q12H  . vancomycin  750 mg Intravenous Q12H    Objective: Vital signs in last 24 hours: Temp:  [97.5 F (36.4 C)-99.6 F (37.6 C)] 97.5 F (36.4 C) (05/19 0748) Pulse Rate:  [74-89] 74 (05/19 0748) Resp:  [15-20] 16 (05/19 0748) BP: (93-130)/(65-96) 118/96 mmHg (05/19 0748) SpO2:  [99 %-100 %] 99 % (05/19 0748)   General appearance: alert, cooperative and no distress Resp: diminished breath sounds anterior - bilateral Cardio: regular rate and rhythm GI: normal findings: bowel sounds normal and soft, non-tender  Lab Results  Recent Labs  02/22/15 0415 02/23/15 0526 02/24/15 0639  WBC 18.6* 16.0* 16.1*  HGB 9.9* 9.3* 9.4*  HCT 31.1* 28.8* 29.1*  NA  134* 134*  --   K 3.9 3.6  --   CL 111 111  --   CO2 18* 18*  --   BUN 6 6  --   CREATININE 0.46* 0.53*  --    Liver Panel  Recent Labs  02/22/15 0415 02/23/15 0526  PROT 5.6* 5.5*  ALBUMIN 1.4* 1.4*  AST 9* 8*  ALT 6* 7*  ALKPHOS 101 98  BILITOT 0.3 0.3   Sedimentation Rate No results for input(s): ESRSEDRATE in the last 72 hours. C-Reactive Protein No results for input(s): CRP in the last 72 hours.  Microbiology: Recent Results (from the past 240 hour(s))  Blood culture (routine x 2)     Status: None (Preliminary result)   Collection Time: 02/18/15  4:15 PM  Result Value Ref Range Status   Specimen Description BLOOD RIGHT ANTECUBITAL  Final   Special Requests BOTTLES DRAWN AEROBIC ONLY 2CC  Final   Culture   Final           BLOOD CULTURE RECEIVED NO GROWTH TO DATE CULTURE WILL BE HELD FOR 5 DAYS BEFORE ISSUING A FINAL NEGATIVE REPORT Performed at [facility]    Report Status PENDING  Incomplete  Blood culture (routine x 2)     Status: None (Preliminary result)   Collection Time: 02/18/15  4:18 PM  Result Value Ref Range Status   Specimen Description BLOOD LEFT ANTECUBITAL  Final   Special Requests BOTTLES DRAWN AEROBIC AND ANAEROBIC 5CC  Final   Culture   Final           BLOOD CULTURE RECEIVED NO GROWTH TO DATE CULTURE WILL BE HELD FOR 5 DAYS BEFORE ISSUING A FINAL NEGATIVE REPORT Performed at [facility]    Report Status PENDING  Incomplete  MRSA PCR Screening     Status: None   Collection Time: 02/18/15  8:33 PM  Result Value Ref Range Status   MRSA by PCR NEGATIVE NEGATIVE Final    Comment:        The GeneXpert MRSA Assay (FDA approved for NASAL specimens only), is one component of a comprehensive MRSA colonization surveillance program. It is not intended to diagnose MRSA infection nor to guide or monitor treatment for MRSA infections.   Wound culture     Status: None   Collection Time: 02/21/15  6:32 AM  Result Value Ref Range  Status   Specimen Description WOUND SACRAL  Final   Special Requests Normal  Final   Gram Stain   Final    NO WBC SEEN NO SQUAMOUS EPITHELIAL CELLS SEEN FEW GRAM VARIABLE ROD Performed at [facility]    Culture   Final    MULTIPLE ORGANISMS PRESENT, NONE PREDOMINANT Note: NO STAPHYLOCOCCUS AUREUS ISOLATED NO GROUP A STREP (S.PYOGENES) ISOLATED Performed at [facility]    Report Status 02/24/2015 FINAL  Final    Studies/Results: No results found.   Assessment/Plan: Acute B PE Fever Decubitus Osteomyelitis DM1, uncontrolled, mild DKA Severe Protein Calorie Malnutrition Hyponaremia  Total days of antibiotics: 6 vanco/aztreonam/flagyl  Would aim for 1 month of anbx His (superficial) Cx may not be representative of deeper tissues where his infection is. Continue very broad coverage at this point.  Wound care eval F/u in ID clinic          [name] Infectious Diseases (pager) [phone] [email or web address] 02/24/2015, 12:58 PM  LOS: 6 days

## 2015-02-24 NOTE — Progress Notes (Signed)
Inpatient Diabetes Program Recommendations  AACE/ADA: New Consensus Statement on Inpatient Glycemic Control (2013)  Target Ranges:  Prepandial:   less than 140 mg/dL      Peak postprandial:   less than 180 mg/dL (1-2 hours)      Critically ill patients:  140 - 180 mg/dL     Results for ELMO, RIO (MRN [id number]) as of 02/24/2015 12:14  Ref. Range 02/23/2015 00:13 02/23/2015 03:07 02/23/2015 08:55 02/23/2015 12:55 02/23/2015 16:16 02/23/2015 20:23  Glucose-Capillary Latest Ref Range: 65-99 mg/dL 249 (H) 231 (H) 220 (H) 195 (H) 221 (H) 240 (H)    Results for BASEM, YANNUZZI (MRN [id number]) as of 02/24/2015 12:14  Ref. Range 02/24/2015 00:51 02/24/2015 04:37 02/24/2015 07:47 02/24/2015 11:47  Glucose-Capillary Latest Ref Range: 65-99 mg/dL 238 (H) 248 (H) 275 (H) 299 (H)     Home DM Meds: Lantus 15 units QHS       Novolog 5 units tidwc  Current DM Orders: Lantus 10 units daily in the AM            Novolog Sensitive SSI tid    **Patient currently has order for CBG checks to be completed Q4 hours, however, patient also has PO diet orders.  **Glucose levels elevated consistently >200 mg/dl.    MD- Please consider the following:   1. Change CBG orders to tid ac + HS (currently ordered as Q4 hour CBG checks)  2. Increase Lantus to 15 units daily in the AM  3. Add low dose Novolog Meal Coverage- Novolog 2 units tid with meals     Will follow Wyn Quaker RN, MSN, CDE Diabetes Coordinator Inpatient Diabetes Program Team Pager: [phone] (8a-5p)

## 2015-02-24 NOTE — Progress Notes (Addendum)
[facility] PROGRESS NOTE  Jason [name] TIR:[id number] DOB: 08-26-1986 DOA: 02/18/2015 PCP: [name], MD  Assessment/Plan: Active Problems:   Osteomyelitis   Anemia   DKA (diabetic ketoacidoses)   Infected wound   Bilateral pulmonary embolism   Palliative care encounter   Pulmonary embolus   Insomnia  Acute Hypoxemic respiratory failure -CT angiogram shows Acute pulmonary embolus involving bilateral lower lobes with extensive consolidation of the bilateral lower lobes as described. The consolidations are most likely impart due to pulmonary infarction but underlying pneumonia is not excluded. 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 Johnnye Sima on board and guiding [name] on antibiotic choice and length  -Last debridement. 12/08/14 by Dr. Migdalia Dk, she has been consulted during this admission and per discussion with her plan is for hydrotherapy at this moment; will place orders and follow PT inputs. -Wound care consultation completed, see note from 5/16 -Continue Air mattress -Currently on Aztreonam, Vancomycin, metronidazole -day #6 as per ID rec's  Chronic hypotension: Continue midodrine, dose increased during this admission to help maintaining BP. -will monitor VS  Hyponatremia, sodium has improved after sugar control and IVF's -patient was not eating much at home -advise to keep himself well hydrated and to maintain good PO intake  Severe Protein calorie malnutrition: will follow rec's from dietitian and continue feeding supplements   Chronic anemia: -Without evidence of acute GI bleeding. But, with  high concerns of chronic bleeding from decubitus ulcer -Had 1 unit of PRBC on 3/8.  -Patient received 2 units on 5/14 -will follow Hgb trend -Transfuse for active bleeding or hemoglobin less than 7  Uncontrolled DM 1: Mild DKA with metabolic acidosis on admission- -refused insulin drip this admission, accepted to take Lantus and sliding scale insulin -patient acidosis and mild DKA now resolved -A1C 8.1 -discussed with mother and patient regarding importance on sugar control and medication compliance -will monitor and continue adjusting hypoglycemic regimen as needed (patient is eating better now)   Neurogenic bladder:  -Had Klebsiella and BURKHOLDERIA UTI on previous admissions; no dysuria -will monitor -currently on broad spectrum antibiotics  Spinal cord infarction and quadriplegia: -Patient awake alert and oriented 3. -complaining of some neuropathy (which he has hx of, especially with diabetes and spinal cord injury) -will continue Lyrica  Insomnia: will continue Remeron  S/p Diverting Colostomy and suprapubic catheter  Reactive thrombocytosis: on heparin drip.  Code Status: full Family Communication: discussed with mother at bedside Disposition Plan: Overall prognosis is extremely grim, plastic surgery consultation and final recommendations pending    Consultants:  Infectious disease  Palliative Care  Procedures:  None  Antibiotics: Aztreonam, vancomycin and flagyl  5/13>>  HPI/Subjective: Patient is AAOX3, no fever and reports he had good night sleep. He is complaining of pain in his back and endorses fentanyl is not lasting enough (but when given alleviates pain)  Objective: Filed Vitals:   02/24/15 0051 02/24/15 0439 02/24/15 0748 02/24/15 1420  BP: 111/70 93/65 118/96 114/77  Pulse: 85 79 74 99  Temp: 99.6 F (37.6 C) 97.9 F (36.6 C) 97.5 F (36.4 C) 98 F (36.7 C)  TempSrc: Oral Oral Oral Oral  Resp: 18 18 16 16   Height:  Weight:       SpO2: 100% 100% 99% 99%    Intake/Output Summary (Last 24 hours) at 02/24/15 1558 Last data filed at 02/24/15 1554  Gross per 24 hour  Intake 3251.5 ml  Output   3450 ml  Net -198.5 ml    Exam: Gen: no acute complaints, afebrile and feeling better. Reports main complaint is pain (but endorses fentanyl works ok, just is not lasting enough). Patient slept better yesterday Lungs: Clear to auscultation bilaterally without wheezes or crackles; good air movement Cardiovascular: Regular rate and rhythm without murmur gallop or rub normal S1 and S2 Abdomen: Nontender, nondistended, soft, bowel sounds positive, no rebound, no ascites, no appreciable mass Extremities: No significant cyanosis, clubbing, or edema bilateral lower extremities Neurological:alert to place and person; + quadriplegia  Skin: Multiple deep wounds to right buttock and perineal region. Large pressure wound to mid back. Wounds w mild, malodorous, yellowish-green drainage. No surrounding cellulitis appreciated. No fluctuance/abscess noted. No crepitus. Some necrotic tissue seen.   Data Reviewed: Basic Metabolic Panel:  Recent Labs Lab 02/18/15 2049  02/19/15 1327 02/20/15 0456 02/20/15 1214 02/21/15 0247 02/22/15 0415 02/23/15 0526  NA 129*  < > 131* 130*  --  137 134* 134*  K 3.7  < > 4.2 4.6  --  3.9 3.9 3.6  CL 102  < > 108 109  --  116* 111 111  CO2 17*  < > 16* 15*  --  16* 18* 18*  GLUCOSE 249*  < > 180* 207*  --  70 187* 265*  BUN 12  < > 9 10  --  7 6 6   CREATININE 0.61  < > 0.52* 0.52*  --  0.41* 0.46* 0.53*  CALCIUM 9.9  < > 9.6 9.1  --  9.5 9.0 8.7*  MG 1.1*  --   --   --  3.0* 1.9  --  1.3*  PHOS 2.6  --   --   --   --   --   --   --   < > = values in this interval not displayed.  Liver Function Tests:  Recent Labs Lab 02/18/15 1615 02/20/15 0456 02/21/15 0247 02/22/15 0415 02/23/15 0526  AST 9* 16 9* 9* 8*  ALT 12* 9* 8* 6* 7*  ALKPHOS 114 105 94 101 98  BILITOT 0.6 1.1 0.1* 0.3 0.3   PROT 7.1 5.7* 6.2* 5.6* 5.5*  ALBUMIN 2.0* 1.4* 1.5* 1.4* 1.4*   CBC:  Recent Labs Lab 02/18/15 1615  02/20/15 0456 02/21/15 0247 02/22/15 0415 02/23/15 0526 02/24/15 0639  WBC 17.6*  < > 21.3* 18.0* 18.6* 16.0* 16.1*  NEUTROABS 11.6*  --   --   --   --   --   --   HGB 7.2*  < > 9.9* 10.4* 9.9* 9.3* 9.4*  HCT 23.6*  < > 30.7* 32.1* 31.1* 28.8* 29.1*  MCV 79.5  < > 81.2 80.5 80.8 80.7 80.6  PLT 812*  < > 767* 891* 881* 836* 788*  < > = values in this interval not displayed.  Cardiac Enzymes:  Recent Labs Lab 02/20/15 0458 02/20/15 1214 02/20/15 1824  TROPONINI <0.03 <0.03 <0.03   CBG:  Recent Labs Lab 02/23/15 2023 02/24/15 0051 02/24/15 0437 02/24/15 0747 02/24/15 1147  GLUCAP 240* 238* 248* 275* 299*    Recent Results (from the past 240 hour(s))  Blood culture (routine x 2)     Status: None (Preliminary result)   Collection Time: 02/18/15  4:15 PM  Result Value Ref Range Status   Specimen Description BLOOD RIGHT ANTECUBITAL  Final   Special Requests BOTTLES DRAWN AEROBIC ONLY 2CC  Final   Culture   Final           BLOOD CULTURE RECEIVED NO GROWTH TO DATE CULTURE WILL BE HELD FOR 5 DAYS BEFORE ISSUING A FINAL NEGATIVE REPORT Performed at [facility]    Report Status PENDING  Incomplete  Blood culture (routine x 2)     Status: None (Preliminary result)   Collection Time: 02/18/15  4:18 PM  Result Value Ref Range Status   Specimen Description BLOOD LEFT ANTECUBITAL  Final   Special Requests BOTTLES DRAWN AEROBIC AND ANAEROBIC 5CC  Final   Culture   Final           BLOOD CULTURE RECEIVED NO GROWTH TO DATE CULTURE WILL BE HELD FOR 5 DAYS BEFORE ISSUING A FINAL NEGATIVE REPORT Performed at [facility]    Report Status PENDING  Incomplete  MRSA PCR Screening     Status: None   Collection Time: 02/18/15  8:33 PM  Result Value Ref Range Status   MRSA by PCR NEGATIVE NEGATIVE Final    Comment:        The GeneXpert MRSA Assay  (FDA approved for NASAL specimens only), is one component of a comprehensive MRSA colonization surveillance program. It is not intended to diagnose MRSA infection nor to guide or monitor treatment for MRSA infections.   Wound culture     Status: None   Collection Time: 02/21/15  6:32 AM  Result Value Ref Range Status   Specimen Description WOUND SACRAL  Final   Special Requests Normal  Final   Gram Stain   Final    NO WBC SEEN NO SQUAMOUS EPITHELIAL CELLS SEEN FEW GRAM VARIABLE ROD Performed at [facility]    Culture   Final    MULTIPLE ORGANISMS PRESENT, NONE PREDOMINANT Note: NO STAPHYLOCOCCUS AUREUS ISOLATED NO GROUP A STREP (S.PYOGENES) ISOLATED Performed at [facility]    Report Status 02/24/2015 FINAL  Final     Studies: Ct Angio Chest Pe W/cm &/or Wo Cm  02/20/2015   CLINICAL DATA:  Acute respiratory failure, hypoxia, shortness of breath, chest pain.  EXAM: CT ANGIOGRAPHY CHEST WITH CONTRAST  TECHNIQUE: Multidetector CT imaging of the chest was performed using the standard protocol during bolus administration of intravenous contrast. Multiplanar CT image reconstructions and MIPs were obtained to evaluate the vascular anatomy.  CONTRAST:  50mL OMNIPAQUE IOHEXOL 350 MG/ML SOLN  COMPARISON:  January 11, 2015  FINDINGS: There is acute pulmonary embolus in segmental and subsegmental pulmonary arteries of the left lower lobe. There is acute pulmonary embolus in subsegmental pulmonary arteries of the right lower lobe. There are extensive consolidation of the right lower lobe and the medial aspect of the left lower lobe. There are small bilateral pleural effusions. There are lymphadenopathy in the mediastinum. The aorta is normal. There is small pericardial effusion. The visualized upper abdominal structures are normal.  Review of the MIP images confirms the above findings.  IMPRESSION: Acute pulmonary embolus involving bilateral lower lobes with extensive consolidation  of the bilateral lower lobes as described. The consolidations are most likely impart due to pulmonary infarction but underlying pneumonia is not excluded.  Critical Value/emergent results were called by telephone at the time of interpretation on 02/20/2015 at 8:33 am to patient's nurse Clarisa Fling who verbally acknowledged these results.  Electronically Signed   By: Abelardo Diesel M.D.   On: 02/20/2015 08:40   Mr Lumbar Spine Wo Contrast  02/19/2015   CLINICAL DATA:  Infected sacral decubitus ulcers. Fever. Leukocytosis.  EXAM: MRI LUMBAR SPINE WITHOUT CONTRAST, MRI OF THE SACRUM WITH AND WITHOUT CONTRAST  TECHNIQUE: Multiplanar, multisequence MR imaging of the lumbar spine and sacrum was performed. Contrast was performed for the sacral imaging.  CONTRAST:  10 cc MultiHance  COMPARISON:  CT scan of the abdomen and pelvis dated 01/11/2015 and MRI of the pelvis dated 01/08/2015  FINDINGS: MRI of the lumbar spine:  Normal conus tip at L1-2. There is diffuse edema in the posterior paraspinal musculature and in the gluteal muscles. There is also atrophy of the paraspinal musculature. The patient is quadriplegic. The paraspinal soft tissues are otherwise normal.  The discs from T11-12 through L5-S1 are normal. There is low signal throughout the bones of the lumbar spine which may represent a red marrow reactivation. There is no evidence of spinal infection. No spinal or foraminal stenosis or facet arthritis.  MRI of the sacrum:  There is abnormal edema and enhancement of the right ischial tuberosity. There is a deep decubitus ulcer that extends to the ischial tuberosity and there is a small abscess extending to enhance cm laterally from the decubitus ulcer at the ischial tuberosity. This pus collection does communicate with the decubitus ulcer.  There is devitalized soft tissue superficial to the left ischial tuberosity at the site of another decubitus ulcer. There is also devitalized soft tissue at the site of  the soft tissue ulcer overlying the sacrum. There is slight edema and enhancement of the remnant of the fifth sacral segment and of the entire fourth thick sacral segment but there is no bone destruction.  No evidence of epidural infection in the sacrum.  There is abnormal subcutaneous edema throughout the pelvis with muscle atrophy.  IMPRESSION: 1. Multiple sacral and ischial tuberosity decubitus ulcers. Pus extends from the depths of the right ischial tuberosity ulcer 2.5 cm laterally. 2. Abnormal edema and enhancement of the right ischial tuberosity with loss of the cortical margin, consistent with osteomyelitis. 3. Abnormal edema and enhancement of the fourth sacral segment and of the remnant of the fifth sacral segment. This is immediately adjacent to a sacral decubitus ulcer. This is not definitive for osteomyelitis however.   Electronically Signed   By: Lorriane Shire M.D.   On: 02/19/2015 13:16   Mr Sacrum/si Joints W Wo Contrast  02/19/2015   CLINICAL DATA:  Infected sacral decubitus ulcers. Fever. Leukocytosis.  EXAM: MRI LUMBAR SPINE WITHOUT CONTRAST, MRI OF THE SACRUM WITH AND WITHOUT CONTRAST  TECHNIQUE: Multiplanar, multisequence MR imaging of the lumbar spine and sacrum was performed. Contrast was performed for the sacral imaging.  CONTRAST:  10 cc MultiHance  COMPARISON:  CT scan of the abdomen and pelvis dated 01/11/2015 and MRI of the pelvis dated 01/08/2015  FINDINGS: MRI of the lumbar spine:  Normal conus tip at L1-2. There is diffuse edema in the posterior paraspinal musculature and in the gluteal muscles. There is also atrophy of the paraspinal musculature. The patient is quadriplegic. The paraspinal soft tissues are otherwise normal.  The discs from T11-12 through L5-S1 are normal. There is low signal throughout the bones of the lumbar spine which may represent a red marrow reactivation. There is no evidence of spinal infection. No spinal or foraminal stenosis or facet arthritis.  MRI of  the sacrum:  There is abnormal  edema and enhancement of the right ischial tuberosity. There is a deep decubitus ulcer that extends to the ischial tuberosity and there is a small abscess extending to enhance cm laterally from the decubitus ulcer at the ischial tuberosity. This pus collection does communicate with the decubitus ulcer.  There is devitalized soft tissue superficial to the left ischial tuberosity at the site of another decubitus ulcer. There is also devitalized soft tissue at the site of the soft tissue ulcer overlying the sacrum. There is slight edema and enhancement of the remnant of the fifth sacral segment and of the entire fourth thick sacral segment but there is no bone destruction.  No evidence of epidural infection in the sacrum.  There is abnormal subcutaneous edema throughout the pelvis with muscle atrophy.  IMPRESSION: 1. Multiple sacral and ischial tuberosity decubitus ulcers. Pus extends from the depths of the right ischial tuberosity ulcer 2.5 cm laterally. 2. Abnormal edema and enhancement of the right ischial tuberosity with loss of the cortical margin, consistent with osteomyelitis. 3. Abnormal edema and enhancement of the fourth sacral segment and of the remnant of the fifth sacral segment. This is immediately adjacent to a sacral decubitus ulcer. This is not definitive for osteomyelitis however.   Electronically Signed   By: Lorriane Shire M.D.   On: 02/19/2015 13:16   Dg Chest Port 1 View  02/20/2015   CLINICAL DATA:  Patient woke up this morning with chest pain and shortness of breath.  EXAM: PORTABLE CHEST - 1 VIEW  COMPARISON:  CT chest 01/11/2015.  Chest 01/10/2015.  FINDINGS: Borderline heart size with normal pulmonary vascularity. Patchy bilateral basilar airspace disease is improving since previous study. Small bilateral pleural effusions. Right PICC catheter tip is over the cavoatrial junction. No pneumothorax. Old fracture deformity of the right clavicle.  IMPRESSION:  Basilar airspace infiltrates are improving since previous study. Small bilateral pleural effusions.   Electronically Signed   By: Lucienne Capers M.D.   On: 02/20/2015 05:42    Scheduled Meds: . aztreonam  2 g Intravenous 3 times per day  . baclofen  10 mg Oral BID WC  . baclofen  20 mg Oral QHS  . feeding supplement (ENSURE ENLIVE)  237 mL Oral BID BM  . feeding supplement (GLUCERNA SHAKE)  237 mL Oral TID BM  . fentaNYL  25 mcg Transdermal Q72H  . insulin aspart  0-9 Units Subcutaneous TID WC  . insulin glargine  10 Units Subcutaneous Daily  . loratadine  10 mg Oral Daily  . metoCLOPramide  5 mg Oral Daily  . metronidazole  500 mg Intravenous Q8H  . midodrine  10 mg Oral TID WC  . mirtazapine  7.5 mg Oral QHS  . pantoprazole  40 mg Oral Daily  . potassium chloride SA  20 mEq Oral Daily  . pregabalin  100 mg Oral TID WC  . pregabalin  200 mg Oral QHS  . sodium chloride  10-40 mL Intracatheter Q12H  . vancomycin  750 mg Intravenous Q12H   Continuous Infusions: . dextrose 5 % and 0.9% NaCl 75 mL/hr at 02/24/15 1259  . heparin 1,500 Units/hr (02/24/15 1032)    Active Problems:   Osteomyelitis   Anemia   DKA (diabetic ketoacidoses)   Infected wound   Bilateral pulmonary embolism   Palliative care encounter   Pulmonary embolus   Insomnia    Time spent: 35 minutes   Barton Dubois  [facility] Pager [phone]. If 7PM-7AM, please contact night-coverage at [email or web address],  password [email or web address] 02/24/2015, 3:58 PM  LOS: 6 days

## 2015-02-25 ENCOUNTER — Encounter: Payer: Self-pay | Admitting: Infectious Disease

## 2015-02-25 DIAGNOSIS — T798XXD Other early complications of trauma, subsequent encounter: Secondary | ICD-10-CM

## 2015-02-25 LAB — BASIC METABOLIC PANEL
ANION GAP: 3 — AB (ref 5–15)
BUN: 7 mg/dL (ref 6–20)
CHLORIDE: 110 mmol/L (ref 101–111)
CO2: 20 mmol/L — ABNORMAL LOW (ref 22–32)
Calcium: 8.6 mg/dL — ABNORMAL LOW (ref 8.9–10.3)
Creatinine, Ser: 0.49 mg/dL — ABNORMAL LOW (ref 0.61–1.24)
GFR calc Af Amer: >60 mL/min (ref >60)
Glucose, Bld: 281 mg/dL — ABNORMAL HIGH (ref 65–99)
POTASSIUM: 3.5 mmol/L (ref 3.5–5.1)
SODIUM: 133 mmol/L — AB (ref 135–145)

## 2015-02-25 LAB — CULTURE, BLOOD (ROUTINE X 2)
Culture: NO GROWTH
Culture: NO GROWTH

## 2015-02-25 LAB — VANCOMYCIN, TROUGH: VANCOMYCIN TR: 18 ug/mL (ref 10.0–20.0)

## 2015-02-25 LAB — GLUCOSE, CAPILLARY
GLUCOSE-CAPILLARY: 221 mg/dL — AB (ref 65–99)
GLUCOSE-CAPILLARY: 262 mg/dL — AB (ref 65–99)
GLUCOSE-CAPILLARY: 191 mg/dL — AB (ref 65–99)
Glucose-Capillary: 275 mg/dL — ABNORMAL HIGH (ref 65–99)
Glucose-Capillary: 321 mg/dL — ABNORMAL HIGH (ref 65–99)
Glucose-Capillary: 370 mg/dL — ABNORMAL HIGH (ref 65–99)

## 2015-02-25 LAB — CBC
HCT: 29.4 % — ABNORMAL LOW (ref 39.0–52.0)
HEMOGLOBIN: 9.4 g/dL — AB (ref 13.0–17.0)
MCH: 25.9 pg — AB (ref 26.0–34.0)
MCHC: 32 g/dL (ref 30.0–36.0)
MCV: 81 fL (ref 78.0–100.0)
Platelets: 815 10*3/uL — ABNORMAL HIGH (ref 150–400)
RBC: 3.63 MIL/uL — ABNORMAL LOW (ref 4.22–5.81)
RDW: 16 % — ABNORMAL HIGH (ref 11.5–15.5)
WBC: 18.2 10*3/uL — ABNORMAL HIGH (ref 4.0–10.5)

## 2015-02-25 LAB — HEPARIN LEVEL (UNFRACTIONATED): Heparin Unfractionated: 0.4 IU/mL (ref 0.30–0.70)

## 2015-02-25 MED ORDER — INSULIN GLARGINE 100 UNIT/ML ~~LOC~~ SOLN
20.0000 [IU] | Freq: Every day | SUBCUTANEOUS | Status: DC
  Administered 2015-02-26: 20 [IU] via SUBCUTANEOUS
  Filled 2015-02-25: qty 0.2

## 2015-02-25 MED ORDER — COLLAGENASE 250 UNIT/GM EX OINT
TOPICAL_OINTMENT | Freq: Every day | CUTANEOUS | Status: DC
  Administered 2015-02-26 – 2015-03-01 (×4): via TOPICAL
  Administered 2015-03-02: 1 via TOPICAL
  Administered 2015-03-03 – 2015-03-05 (×2): via TOPICAL
  Filled 2015-02-25 (×2): qty 30

## 2015-02-25 MED ORDER — OXYCODONE HCL 5 MG PO TABS
5.0000 mg | ORAL_TABLET | Freq: Four times a day (QID) | ORAL | Status: DC | PRN
  Administered 2015-02-25 – 2015-02-26 (×3): 10 mg via ORAL
  Filled 2015-02-25 (×3): qty 2

## 2015-02-25 MED ORDER — INSULIN ASPART 100 UNIT/ML ~~LOC~~ SOLN
5.0000 [IU] | Freq: Three times a day (TID) | SUBCUTANEOUS | Status: DC
  Administered 2015-02-26 – 2015-02-27 (×5): 5 [IU] via SUBCUTANEOUS

## 2015-02-25 MED ORDER — FENTANYL 50 MCG/HR TD PT72
50.0000 ug | MEDICATED_PATCH | TRANSDERMAL | Status: DC
  Administered 2015-02-27 – 2015-03-05 (×3): 50 ug via TRANSDERMAL
  Filled 2015-02-25 (×3): qty 1

## 2015-02-25 NOTE — Progress Notes (Signed)
Inpatient Diabetes Program Recommendations  AACE/ADA: New Consensus Statement on Inpatient Glycemic Control (2013)  Target Ranges:  Prepandial:   less than 140 mg/dL      Peak postprandial:   less than 180 mg/dL (1-2 hours)      Critically ill patients:  140 - 180 mg/dL    Results for TAVARION, BABINGTON (MRN [id number]) as of 02/25/2015 09:22  Ref. Range 02/24/2015 00:51 02/24/2015 04:37 02/24/2015 07:47 02/24/2015 11:47 02/24/2015 17:45 02/24/2015 20:50  Glucose-Capillary Latest Ref Range: 65-99 mg/dL 238 (H) 248 (H) 275 (H) 299 (H) 320 (H) 317 (H)    Results for CANTON, YEARBY (MRN [id number]) as of 02/25/2015 09:22  Ref. Range 02/25/2015 00:02 02/25/2015 04:22 02/25/2015 07:53  Glucose-Capillary Latest Ref Range: 65-99 mg/dL 321 (H) 275 (H) 221 (H)     Home DM Meds: Lantus 15 units QHS  Novolog 5 units tidwc  Current DM Orders: Lantus 15 units daily in the AM  Novolog Sensitive SSI tid    **Patient currently has order for CBG checks to be completed Q4 hours, however, patient also has PO diet orders.  **Glucose levels elevated consistently >200 mg/dl.  **Note Lantus increased to 15 units daily yesterday- Will get increased dose today.    MD- Please consider the following:   1. Change CBG orders to tid ac + HS (currently ordered as Q4 hour CBG checks)  2. Add low dose Novolog Meal Coverage- Novolog 3 units tid with meals    Will follow Wyn Quaker RN, MSN, CDE Diabetes Coordinator Inpatient Diabetes Program Team Pager: [phone] (8a-5p)

## 2015-02-25 NOTE — Progress Notes (Signed)
ANTIBIOTIC CONSULT NOTE  Pharmacy Consult for vancomycin Indication: osteomyelitis  Allergies  Allergen Reactions  . Cefuroxime Axetil Anaphylaxis  . Ertapenem Other (See Comments)    Rash and confusion  . Morphine And Related Other (See Comments)    Changed mental status, confusion, headache, visual hallucination  . Penicillins Anaphylaxis and Other (See Comments)    ?can take amoxicillin?  . Sulfa Antibiotics Anaphylaxis, Shortness Of Breath and Other (See Comments)  . Tessalon [Benzonatate] Anaphylaxis  . Shellfish Allergy Itching and Other (See Comments)    Took benadryl to alleviate reaction  . Miripirium Rash and Other (See Comments)    Change in mental status    Patient Measurements: Height: 5\' 8"  (172.7 cm) Weight: 111 lb 15.9 oz (50.8 kg) IBW/kg (Calculated) : 68.4 Adjusted Body Weight:   Vital Signs: Temp: 98 F (36.7 C) (05/20 1558) Temp Source: Oral (05/20 1558) BP: 111/72 mmHg (05/20 1558) Pulse Rate: 86 (05/20 1558) Intake/Output from previous day: 05/19 0701 - 05/20 0700 In: 3153.5 [P.O.:942; I.V.:1461.5; IV Piggyback:750] Out: 4450 [Urine:3250; Stool:1200] Intake/Output from this shift: Total I/O In: 1560 [P.O.:1560] Out: 801 [Stool:801]  Labs:  Recent Labs  02/23/15 0526 02/24/15 0639 02/25/15 0452  WBC 16.0* 16.1* 18.2*  HGB 9.3* 9.4* 9.4*  PLT 836* 788* 815*  CREATININE 0.53*  --  0.49*   Estimated Creatinine Clearance: 97 mL/min (by C-G formula based on Cr of 0.49).  Recent Labs  02/23/15 1030 02/25/15 1605  VANCOTROUGH 12 18     Medical History: Past Medical History  Diagnosis Date  . GERD (gastroesophageal reflux disease)   . Asthma   . Hx MRSA infection     on face  . Gastroparesis   . Diabetic neuropathy   . Seizures   . Family history of anesthesia complication     Pt mother can't have epidural procedures  . Dysrhythmia   . Pneumonia   . Arthritis   . Fibromyalgia   . Stroke 01/29/2014    spinal stroke ;  "quadriplegic since"  . Type I diabetes mellitus     sees Dr. Loanne Drilling   . Syncope 02/16/2015    Medications:  Anti-infectives    Start     Dose/Rate Route Frequency Ordered Stop   02/23/15 1350  vancomycin (VANCOCIN) IVPB 750 mg/150 ml premix     750 mg 150 mL/hr over 60 Minutes Intravenous Every 12 hours 02/23/15 1351     02/20/15 2300  vancomycin (VANCOCIN) 500 mg in sodium chloride 0.9 % 100 mL IVPB  Status:  Discontinued     500 mg 100 mL/hr over 60 Minutes Intravenous Every 12 hours 02/20/15 1018 02/23/15 1351   02/20/15 1400  aztreonam (AZACTAM) 2 g in dextrose 5 % 50 mL IVPB     2 g 100 mL/hr over 30 Minutes Intravenous 3 times per day 02/19/15 1417     02/20/15 1030  vancomycin (VANCOCIN) IVPB 1000 mg/200 mL premix     1,000 mg 200 mL/hr over 60 Minutes Intravenous  Once 02/20/15 1018 02/20/15 1311   02/20/15 0800  metroNIDAZOLE (FLAGYL) IVPB 500 mg     500 mg 100 mL/hr over 60 Minutes Intravenous Every 8 hours 02/19/15 1417     02/20/15 0200  tigecycline (TYGACIL) 50 mg in sodium chloride 0.9 % 100 mL IVPB  Status:  Discontinued     50 mg 200 mL/hr over 30 Minutes Intravenous Every 12 hours 02/19/15 1323 02/19/15 1417   02/19/15 1400  tigecycline (TYGACIL) 100 mg  in sodium chloride 0.9 % 100 mL IVPB     100 mg 200 mL/hr over 30 Minutes Intravenous  Once 02/19/15 1323 02/19/15 1526   02/19/15 0800  vancomycin (VANCOCIN) 500 mg in sodium chloride 0.9 % 100 mL IVPB  Status:  Discontinued     500 mg 100 mL/hr over 60 Minutes Intravenous Every 12 hours 02/18/15 1804 02/19/15 1323   02/19/15 0400  aztreonam (AZACTAM) 1 g in dextrose 5 % 50 mL IVPB  Status:  Discontinued     1 g 100 mL/hr over 30 Minutes Intravenous Every 8 hours 02/18/15 1804 02/18/15 2026   02/18/15 2200  aztreonam (AZACTAM) 2 g in dextrose 5 % 50 mL IVPB  Status:  Discontinued     2 g 100 mL/hr over 30 Minutes Intravenous 3 times per day 02/18/15 1745 02/18/15 1745   02/18/15 1800  vancomycin (VANCOCIN)  IVPB 1000 mg/200 mL premix     1,000 mg 200 mL/hr over 60 Minutes Intravenous  Once 02/18/15 1745 02/18/15 1937   02/18/15 1800  aztreonam (AZACTAM) 2 g in dextrose 5 % 50 mL IVPB  Status:  Discontinued     2 g 100 mL/hr over 30 Minutes Intravenous  Once 02/18/15 1746 02/18/15 2026     Assessment: Patient is on vancomycin d6 along with aztreonam and flagyl for multiple decubitus ulcers and chronic ischial osteomyelitis. Blood cx ngF, wound cx growing multiple organisms.  VT returned therapeutic. SCr stablee, good uop.  Goal of Therapy:  Vancomycin trough level 15-20 mcg/ml  Plan:  -Continue vancomycin 750 mg IV q12h -Monitor cx, renal fx, uop   Hughes Better, PharmD, BCPS Clinical Pharmacist Pager: [phone] 02/25/2015 5:48 PM

## 2015-02-25 NOTE — Progress Notes (Signed)
MD Dyann Kief paged, Jinny Blossom from physical therapy whom is doing hydrotherapy on patient needs specific wound order for which wounds the hydrotherapy will be used on as well as dressing orders for each wound.   Jinny Blossom PT pager [phone].

## 2015-02-25 NOTE — Progress Notes (Signed)
Conveyed to night shift RN [place], the 1400 vanc IVPB needs to be hung after infusion of flagyl is complete, unable to run both due to lack of access.

## 2015-02-25 NOTE — Progress Notes (Signed)
PHARMACY NOTE  Pharmacy Consult :  29 y.o. male is currently on Heparin Infusion for pulmonary embolism .   Heparin Dosing Wt :  50.8 kg  LABS :  Recent Labs  02/22/15 1200 02/22/15 1855 02/23/15 0526 02/24/15 0639 02/25/15 0452  HGB  --   --  9.3* 9.4* 9.4*  HCT  --   --  28.8* 29.1* 29.4*  PLT  --   --  836* 788* 815*  HEPARINUNFRC 0.53 0.30 0.44 0.39 0.40  CREATININE  --   --  0.53*  --  0.49*    MEDICATION: Infusion[s]: Infusions:  . sodium chloride 75 mL/hr at 02/24/15 2342  . heparin 1,500 Units/hr (02/25/15 7096)   ASSESSMENT :  29 y.o. male is currently on Heparin infusion for pulmonary embolism    Heparin infusing at 1500 units/hr.    No evidence of bleeding complications observed.  GOAL :  Heparin Level  0.3 - 0.7 units/ml  PLAN : 1. Continue Heparin at 1500 units/hr.  Will check Heparin level with AM Labs  2. Daily Heparin level, CBC while on Heparin.  Monitor for bleeding complications. Follow Platelet counts.  Marthenia Rolling, Pharm.D

## 2015-02-25 NOTE — Progress Notes (Signed)
Physical Therapy Wound Treatment Patient Details  Name: LAURIS SERVISS MRN: [id number] Date of Birth: [birth year]  Today's Date: 02/25/2015 Time: 6659-9357 Time Calculation (min): 59 min  Subjective  Subjective: "I've been worried about the middle one." Patient and Family Stated Goals: Wounds to heal.  Date of Onset:  (pt unsure exact onset, but states "months".) Prior Treatments: I+D by plastics and dressing changes with Santyl.    Pain Score: Pain Score: 3   Wound Assessment  Pressure Ulcer 11/08/14 Stage II -  Partial thickness loss of dermis presenting as a shallow open ulcer with a red, pink wound bed without slough. wound is full thickness, NOT a pressure ulcer (Active)  Dressing Type Other (Comment) 02/23/2015  9:46 PM  Dressing Other (Comment) 02/23/2015  9:46 PM     Pressure Ulcer 11/08/14 Stage II -  Partial thickness loss of dermis presenting as a shallow open ulcer with a red, pink wound bed without slough. (Active)     Pressure Ulcer Stage III -  Full thickness tissue loss. Subcutaneous fat may be visible but bone, tendon or muscle are NOT exposed. (Active)     Pressure Ulcer 12/13/14 Stage II -  Partial thickness loss of dermis presenting as a shallow open ulcer with a red, pink wound bed without slough. sloughing, tan thick drainage (Active)     Pressure Ulcer 08/13/14 Stage IV - Full thickness tissue loss with exposed bone, tendon or muscle. pt with POA first noted 08/13/14 (Active)     Pressure Ulcer 08/13/14 Stage IV - Full thickness tissue loss with exposed bone, tendon or muscle. was POA, first assessed by [facility] Team 08/13/14 (Active)     Pressure Ulcer 01/06/15 Stage II -  Partial thickness loss of dermis presenting as a shallow open ulcer with a red, pink wound bed without slough. (Active)     Pressure Ulcer 01/06/15 Unstageable - Full thickness tissue loss in which the base of the ulcer is covered by slough (yellow, tan, gray, green or brown) and/or eschar (tan,  brown or black) in the wound bed. (Active)  Dressing Type Barrier Film (skin prep);Gauze (Comment);Moist to dry;Tape dressing 02/25/2015  2:00 PM  Dressing Changed 02/25/2015  2:00 PM  Dressing Change Frequency Daily 02/25/2015  2:00 PM  State of Healing Eschar 02/25/2015  2:00 PM  Site / Wound Assessment Brown;Pink;Yellow;Black 02/25/2015  2:00 PM  % Wound base Red or Granulating 5% 02/25/2015  2:00 PM  % Wound base Yellow 5% 02/25/2015  2:00 PM  % Wound base Black 90% 02/25/2015  2:00 PM  Peri-wound Assessment Intact 02/25/2015  2:00 PM  Wound Length (cm) 6 cm 02/25/2015  2:00 PM  Wound Width (cm) 6.4 cm 02/25/2015  2:00 PM  Wound Depth (cm) 0 cm 02/25/2015  2:00 PM  Margins Unattached edges (unapproximated) 02/25/2015  2:00 PM  Drainage Amount Scant 02/25/2015  2:00 PM  Treatment Hydrotherapy (Pulse lavage);Packing (Saline gauze) 02/25/2015  2:00 PM     Pressure Ulcer 02/18/15 Stage IV - Full thickness tissue loss with exposed bone, tendon or muscle. (Active)  Dressing Type ABD;Barrier Film (skin prep);Gauze (Comment);Moist to dry;Tape dressing 02/25/2015  2:00 PM  Dressing Changed 02/25/2015  2:00 PM  Dressing Change Frequency Twice a day 02/25/2015  2:00 PM  State of Healing Early/partial granulation 02/25/2015  2:00 PM  Site / Wound Assessment Bleeding;Yellow;Pink;Red 02/25/2015  2:00 PM  % Wound base Red or Granulating 50% 02/25/2015  2:00 PM  % Wound base Yellow 40% 02/25/2015  2:00  PM  % Wound base Other (Comment) 10% 02/25/2015  2:00 PM  Peri-wound Assessment Intact 02/25/2015  2:00 PM  Wound Length (cm) 9.9 cm 02/25/2015  2:00 PM  Wound Width (cm) 8.6 cm 02/25/2015  2:00 PM  Wound Depth (cm) 2.9 cm 02/25/2015  2:00 PM  Undermining (cm) Undermines from 10:00 to 3:00 and deepest portion is 2.7cm. 02/25/2015  2:00 PM  Margins Unattached edges (unapproximated) 02/25/2015  2:00 PM  Drainage Amount Copious 02/25/2015  2:00 PM  Drainage Description Serosanguineous;Odor 02/25/2015  2:00 PM  Treatment  Hydrotherapy (Pulse lavage);Packing (Saline gauze) 02/25/2015  2:00 PM     Pressure Ulcer 02/18/15 Stage IV - Full thickness tissue loss with exposed bone, tendon or muscle. (Active)  Dressing Type Gauze (Comment);ABD;Moist to dry 02/25/2015 11:11 AM  Dressing Clean;Dry;Intact;Changed 02/25/2015 11:11 AM  Dressing Change Frequency Daily 02/25/2015 11:11 AM  State of Healing Non-healing 02/23/2015 12:00 PM  Site / Wound Assessment Dressing in place / Unable to assess 02/25/2015 11:11 AM  Wound Length (cm) 7 cm 02/18/2015  9:00 PM  Wound Width (cm) 6 cm 02/18/2015  9:00 PM  Wound Depth (cm) 4 cm 02/18/2015  9:00 PM     Pressure Ulcer 02/18/15 Stage II -  Partial thickness loss of dermis presenting as a shallow open ulcer with a red, pink wound bed without slough. (Active)  Dressing Type ABD;Impregnated gauze (petrolatum) 02/25/2015 11:11 AM  Dressing Clean;Dry;Intact;Changed 02/25/2015 11:11 AM  Dressing Change Frequency Every 3 days 02/25/2015 11:11 AM  Site / Wound Assessment Dressing in place / Unable to assess 02/25/2015 11:11 AM     Pressure Ulcer 02/18/15 Unstageable - Full thickness tissue loss in which the base of the ulcer is covered by slough (yellow, tan, gray, green or brown) and/or eschar (tan, brown or black) in the wound bed. (Active)  Dressing Type Non adherent 02/25/2015 11:11 AM  Dressing Clean;Dry;Intact;Changed 02/25/2015 11:11 AM  Site / Wound Assessment Dressing in place / Unable to assess 02/25/2015 11:11 AM     Pressure Ulcer 02/20/15 Unstageable - Full thickness tissue loss in which the base of the ulcer is covered by slough (yellow, tan, gray, green or brown) and/or eschar (tan, brown or black) in the wound bed. (Active)  Dressing Type Barrier Film (skin prep);Gauze (Comment);Moist to dry;Tape dressing 02/25/2015  2:00 PM  Dressing Changed 02/25/2015  2:00 PM  Dressing Change Frequency Daily 02/25/2015  2:00 PM  State of Healing Early/partial granulation 02/25/2015  2:00 PM  Site /  Wound Assessment Bleeding;Pink;Red;Yellow 02/25/2015  2:00 PM  % Wound base Red or Granulating 70% 02/25/2015  2:00 PM  % Wound base Yellow 30% 02/25/2015  2:00 PM  Peri-wound Assessment Intact 02/25/2015  2:00 PM  Wound Length (cm) 6.9 cm 02/25/2015  2:00 PM  Wound Width (cm) 6.6 cm 02/25/2015  2:00 PM  Wound Depth (cm) 3.2 cm 02/25/2015  2:00 PM  Undermining (cm) Undermines 12:00 to 4:00 with deepest portion 3.9cm.   02/25/2015  2:00 PM  Margins Unattached edges (unapproximated) 02/25/2015  2:00 PM  Drainage Amount Moderate 02/25/2015  2:00 PM  Drainage Description Serosanguineous;Odor 02/25/2015  2:00 PM  Treatment Hydrotherapy (Pulse lavage);Packing (Saline gauze) 02/25/2015  2:00 PM     Pressure Ulcer 02/20/15 Stage II -  Partial thickness loss of dermis presenting as a shallow open ulcer with a red, pink wound bed without slough. (Active)  Dressing Type Gauze (Comment) 02/25/2015 11:11 AM  Dressing Clean;Dry;Intact 02/25/2015 11:11 AM  Dressing Change Frequency Every 3  days 02/23/2015  2:31 PM  Site / Wound Assessment Pink 02/22/2015  8:23 AM  Wound Length (cm) 4 cm 02/22/2015  8:23 AM  Wound Width (cm) 4.5 cm 02/22/2015  8:23 AM  Wound Depth (cm) 1 cm 02/22/2015  8:23 AM  Drainage Amount Minimal 02/22/2015  8:23 AM     Wound / Incision (Open or Dehisced) 12/19/14 Scrotum small open area (Active)     Incision (Closed) 10/12/14 Abdomen Other (Comment) (Active)     Incision (Closed) 12/08/14 Back Other (Comment) (Active)   Hydrotherapy Pulsed lavage therapy - wound location: Sacrum and Bil Ischial Pulsed Lavage with Suction (psi): 8 psi Pulsed Lavage with Suction - Normal Saline Used: 1000 mL Pulsed Lavage Tip: Tip with splash shield   Wound Assessment and Plan  Wound Therapy - Assess/Plan/Recommendations Wound Therapy - Clinical Statement: pt presents with multiple wounds and hx of wounds.  pt would benefit from Hydrotherapy to decrease necrotic tissue and promote wound healing.   Wound  Therapy - Functional Problem List: Multiple wounds and pressure sores. Factors Delaying/Impairing Wound Healing: Altered sensation;Immobility;Polypharmacy Hydrotherapy Plan: Debridement;Dressing change;Patient/family education;Pulsatile lavage with suction Wound Therapy - Frequency: 6X / week Wound Therapy - Current Recommendations:  (Continue with Plastics as outpatient.) Wound Therapy - Follow Up Recommendations:  (Per MD orders.) Wound Plan: See above.  Wound Therapy Goals- Improve the function of patient's integumentary system by progressing the wound(s) through the phases of wound healing (inflammation - proliferation - remodeling) by: Decrease Necrotic Tissue to: 30 Decrease Necrotic Tissue - Progress: Goal set today Increase Granulation Tissue to: 70 Increase Granulation Tissue - Progress: Goal set today Improve Drainage Characteristics: Min Improve Drainage Characteristics - Progress: Goal set today Patient/Family will be able to : pt/family to state and perform dressing changes.   Patient/Family Instruction Goal - Progress: Goal set today Goals/treatment plan/discharge plan were made with and agreed upon by patient/family: Yes Time For Goal Achievement: 7 days Wound Therapy - Potential for Goals: Good  Goals will be updated until maximal potential achieved or discharge criteria met.  Discharge criteria: when goals achieved, discharge from hospital, MD decision/surgical intervention, no progress towards goals, refusal/missing three consecutive treatments without notification or medical reason.  GP     Deloris Moger, Thornton Papas, [place] 02/25/2015, 2:37 PM

## 2015-02-25 NOTE — Clinical Social Work Note (Signed)
Patient will likely need ambulance transport at discharge. CSW has left report for weekend.    Liz Beach MSW, [place], [place], [phone]

## 2015-02-25 NOTE — Progress Notes (Signed)
Nutrition Follow-up  DOCUMENTATION CODES:  Severe malnutrition in context of chronic illness, Underweight  INTERVENTION:  Ensure Enlive (each supplement provides 350kcal and 20 grams of protein), Glucerna shake  NUTRITION DIAGNOSIS:  Malnutrition related to chronic illness as evidenced by severe depletion of body fat, severe depletion of muscle mass, percent weight loss.  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 Pt with SOB with CT angio showing bilateral PE in lower lobes.   PT working with pt at time of visit. Noted plans to start hydrotherapy pending specific orders, per PT notes.  Palliative care following. Pt and mother very hopeful despite pt's grim prognosis.  Intake has improved since admission, likely due to pt being more alert. Noted 50-100% meal completion per doc flowsheets. Noted supplement refusals earlier this week, but pt has been consuming the past 2 days. He just accepted his 1000 dose of Glucerna. Will continue both Ensure and Glucerna supplements to promote optimal nutrition due to increased nutrient needs for wound healing.  Reviewed WOC note from 02/21/16. Plan is to await plastics evaluation for further plan for wound care.  Labs reviewed. Na: 133, CO2: 20, Creat: 0.49, Calcium: 8.0, Glucose: 281. CBGS: 275-317.   Height:  Ht Readings from Last 1 Encounters:  02/20/15 5\' 8"  (1.727 m)    Weight:  Wt Readings from Last 1 Encounters:  02/20/15 111 lb 15.9 oz (50.8 kg)    Ideal Body Weight:  70 kg  Wt Readings from Last 10 Encounters:  02/20/15 111 lb 15.9 oz (50.8 kg)  02/16/15 112 lb (50.803 kg)  01/12/15 128 lb 4.9 oz (58.2 kg)  12/19/14 130 lb 1.1 oz (59 kg)  11/25/14 155 lb  (70.308 kg)  11/19/14 155 lb 13.8 oz (70.7 kg)  10/02/14 125 lb 10.6 oz (57 kg)  09/23/14 131 lb 1.6 oz (59.467 kg)  08/13/14 129 lb 6.6 oz (58.7 kg)  08/03/14 139 lb (63.05 kg)    BMI:  Body mass index is 17.03 kg/(m^2).  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, RD, LDN, CDE Pager: [phone] After hours Pager: [phone]

## 2015-02-25 NOTE — Progress Notes (Signed)
Asif and his mother have been clear about hopes for improvement and open to all available and offered medical interventions to achieve improvement. Palliative care will sign-off but we will be happy to re-engage - if needed please reconsult. Thank you for this consult.   Vinie Sill, NP Palliative Medicine Team Pager # [phone] (M-F 8a-5p) Team Phone # [phone] (Nights/Weekends)

## 2015-02-25 NOTE — Progress Notes (Signed)
[facility] PROGRESS NOTE  VONNIE LIGMAN ZOX:[id number] DOB: [birth year] DOA: 02/18/2015 PCP: [name], MD  Assessment/Plan: Active Problems:   Osteomyelitis   Anemia   DKA (diabetic ketoacidoses)   Infected wound   Bilateral pulmonary embolism   Palliative care encounter   Pulmonary embolus   Insomnia   Sacral pain  Acute Hypoxemic respiratory failure -CT angiogram shows Acute pulmonary embolus involving bilateral lower lobes with extensive consolidation of the bilateral lower lobes as described. The consolidations are most likely impart due to pulmonary infarction but underlying pneumonia is not excluded. 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 Johnnye Sima on board and guiding [name] on antibiotic choice and length  -Last debridement. 12/08/14 by Dr. Migdalia Dk, she has been consulted during this admission and per discussion with her; plan is for hydrotherapy at this moment (started on 5/20); will follow clinical response and follow PT inputs. -Wound care consultation completed, see note from 5/16 -Continue Air mattress -Currently on Aztreonam, Vancomycin, metronidazole -day #7 as per ID rec's  Chronic hypotension: Continue midodrine, dose increased during this admission to help maintaining BP. -will monitor VS  Hyponatremia, sodium has improved after sugar control and IVF's -patient was not eating much at home -advise to keep himself well hydrated and to maintain good PO intake  Severe Protein calorie malnutrition: will follow rec's from dietitian and continue feeding supplements   Chronic  anemia: -Without evidence of acute GI bleeding. But, with high concerns of chronic bleeding from decubitus ulcer -Had 1 unit of PRBC on 3/8.  -Patient received 2 units on 5/14 -will follow Hgb trend -Transfuse for active bleeding or hemoglobin less than 7  Uncontrolled DM 1: Mild DKA with metabolic acidosis on admission- -refused insulin drip this admission, accepted to take Lantus and sliding scale insulin -patient acidosis and mild DKA now resolved -A1C 8.1 -discussed with mother and patient regarding importance on sugar control and medication compliance -will monitor and continue adjusting hypoglycemic regimen as needed (patient is eating better now); for better control will increase lantus and add meal coverage   Neurogenic bladder:  -Had Klebsiella and BURKHOLDERIA UTI on previous admissions; no dysuria -will monitor -currently on broad spectrum antibiotics  Spinal cord infarction and quadriplegia: -Patient awake alert and oriented 3. -complaining of some neuropathy and unrelieved pain (which he has hx of, especially with diabetes and spinal cord injury) -will continue Lyrica and baclofen -will adjust fentanyl patch and add oxycodone for breakthrough   Insomnia: will continue Remeron (helping with sleep and appetite)  S/p Diverting Colostomy and suprapubic catheter  Reactive thrombocytosis: on heparin drip.  Code Status: full Family Communication: discussed with mother at bedside Disposition Plan: Overall prognosis is extremely grim, plastic surgery consultation and final recommendations pending    Consultants:  Infectious disease  Palliative Care  Procedures:  None  Antibiotics: Aztreonam, vancomycin and flagyl  5/13>>  HPI/Subjective: Patient is AAOX3, no fever and reports he is appetite is much better and he is sleeping better. Tolerated hydrotherapy today; but reports increase back pain (not relived with current analgesic regimen)  Objective: Filed  Vitals:   02/25/15 0427 02/25/15 0751 02/25/15 1236 02/25/15 1558  BP: 106/70 105/76 97/72 111/72  Pulse: 81 80  89 86  Temp: 98 F (36.7 C) 98.3 F (36.8 C) 98.1 F (36.7 C) 98 F (36.7 C)  TempSrc: Oral Oral Oral Oral  Resp: 18 16 18 16   Height:      Weight:      SpO2: 100% 100% 100% 100%    Intake/Output Summary (Last 24 hours) at 02/25/15 2225 Last data filed at 02/25/15 2101  Gross per 24 hour  Intake 3118.75 ml  Output   2801 ml  Net 317.75 ml    Exam: Gen: no fever. Complaining of pain in his back (not completely relived now by current analgesic regimen); better appetite and drinking all feeding supplements. Patient reports he is also sleeping better. Lungs: Clear to auscultation bilaterally without wheezes or crackles; good air movement Cardiovascular: Regular rate and rhythm without murmur gallop or rub normal S1 and S2 Abdomen: Nontender, nondistended, soft, bowel sounds positive, no rebound, no ascites, no appreciable mass Extremities: No significant cyanosis, clubbing, or edema bilateral lower extremities Neurological:alert to place and person; + quadriplegia  Skin: Multiple deep wounds to right buttock and perineal region. Large pressure wound to mid back. Wounds w mild, malodorous, yellowish-green drainage. No surrounding cellulitis appreciated. No fluctuance/abscess noted. No crepitus. Some necrotic tissue seen.   Data Reviewed: Basic Metabolic Panel:  Recent Labs Lab 02/20/15 0456 02/20/15 1214 02/21/15 0247 02/22/15 0415 02/23/15 0526 02/25/15 0452  NA 130*  --  137 134* 134* 133*  K 4.6  --  3.9 3.9 3.6 3.5  CL 109  --  116* 111 111 110  CO2 15*  --  16* 18* 18* 20*  GLUCOSE 207*  --  70 187* 265* 281*  BUN 10  --  7 6 6 7   CREATININE 0.52*  --  0.41* 0.46* 0.53* 0.49*  CALCIUM 9.1  --  9.5 9.0 8.7* 8.6*  MG  --  3.0* 1.9  --  1.3*  --     Liver Function Tests:  Recent Labs Lab 02/20/15 0456 02/21/15 0247 02/22/15 0415 02/23/15 0526   AST 16 9* 9* 8*  ALT 9* 8* 6* 7*  ALKPHOS 105 94 101 98  BILITOT 1.1 0.1* 0.3 0.3  PROT 5.7* 6.2* 5.6* 5.5*  ALBUMIN 1.4* 1.5* 1.4* 1.4*   CBC:  Recent Labs Lab 02/21/15 0247 02/22/15 0415 02/23/15 0526 02/24/15 0639 02/25/15 0452  WBC 18.0* 18.6* 16.0* 16.1* 18.2*  HGB 10.4* 9.9* 9.3* 9.4* 9.4*  HCT 32.1* 31.1* 28.8* 29.1* 29.4*  MCV 80.5 80.8 80.7 80.6 81.0  PLT 891* 881* 836* 788* 815*    Cardiac Enzymes:  Recent Labs Lab 02/20/15 0458 02/20/15 1214 02/20/15 1824  TROPONINI <0.03 <0.03 <0.03   CBG:  Recent Labs Lab 02/25/15 0422 02/25/15 0753 02/25/15 1236 02/25/15 1559 02/25/15 1943  GLUCAP 275* 221* 262* 191* 370*    Recent Results (from the past 240 hour(s))  Blood culture (routine x 2)     Status: None   Collection Time: 02/18/15  4:15 PM  Result Value Ref Range Status   Specimen Description BLOOD RIGHT ANTECUBITAL  Final   Special Requests BOTTLES DRAWN AEROBIC ONLY 2CC  Final   Culture   Final    NO GROWTH 5 DAYS Performed at [facility]    Report Status 02/25/2015 FINAL  Final  Blood culture (routine x 2)     Status: None   Collection Time: 02/18/15  4:18 PM  Result Value Ref Range Status   Specimen Description BLOOD LEFT ANTECUBITAL  Final  Special Requests BOTTLES DRAWN AEROBIC AND ANAEROBIC 5CC  Final   Culture   Final    NO GROWTH 5 DAYS Performed at [facility]    Report Status 02/25/2015 FINAL  Final  MRSA PCR Screening     Status: None   Collection Time: 02/18/15  8:33 PM  Result Value Ref Range Status   MRSA by PCR NEGATIVE NEGATIVE Final    Comment:        The GeneXpert MRSA Assay (FDA approved for NASAL specimens only), is one component of a comprehensive MRSA colonization surveillance program. It is not intended to diagnose MRSA infection nor to guide or monitor treatment for MRSA infections.   Wound culture     Status: None   Collection Time: 02/21/15  6:32 AM  Result Value Ref Range Status    Specimen Description WOUND SACRAL  Final   Special Requests Normal  Final   Gram Stain   Final    NO WBC SEEN NO SQUAMOUS EPITHELIAL CELLS SEEN FEW GRAM VARIABLE ROD Performed at [facility]    Culture   Final    MULTIPLE ORGANISMS PRESENT, NONE PREDOMINANT Note: NO STAPHYLOCOCCUS AUREUS ISOLATED NO GROUP A STREP (S.PYOGENES) ISOLATED Performed at [facility]    Report Status 02/24/2015 FINAL  Final     Studies: Ct Angio Chest Pe W/cm &/or Wo Cm  02/20/2015   CLINICAL DATA:  Acute respiratory failure, hypoxia, shortness of breath, chest pain.  EXAM: CT ANGIOGRAPHY CHEST WITH CONTRAST  TECHNIQUE: Multidetector CT imaging of the chest was performed using the standard protocol during bolus administration of intravenous contrast. Multiplanar CT image reconstructions and MIPs were obtained to evaluate the vascular anatomy.  CONTRAST:  8mL OMNIPAQUE IOHEXOL 350 MG/ML SOLN  COMPARISON:  January 11, 2015  FINDINGS: There is acute pulmonary embolus in segmental and subsegmental pulmonary arteries of the left lower lobe. There is acute pulmonary embolus in subsegmental pulmonary arteries of the right lower lobe. There are extensive consolidation of the right lower lobe and the medial aspect of the left lower lobe. There are small bilateral pleural effusions. There are lymphadenopathy in the mediastinum. The aorta is normal. There is small pericardial effusion. The visualized upper abdominal structures are normal.  Review of the MIP images confirms the above findings.  IMPRESSION: Acute pulmonary embolus involving bilateral lower lobes with extensive consolidation of the bilateral lower lobes as described. The consolidations are most likely impart due to pulmonary infarction but underlying pneumonia is not excluded.  Critical Value/emergent results were called by telephone at the time of interpretation on 02/20/2015 at 8:33 am to patient's nurse Clarisa Fling who verbally acknowledged these  results.   Electronically Signed   By: Abelardo Diesel M.D.   On: 02/20/2015 08:40   Mr Lumbar Spine Wo Contrast  02/19/2015   CLINICAL DATA:  Infected sacral decubitus ulcers. Fever. Leukocytosis.  EXAM: MRI LUMBAR SPINE WITHOUT CONTRAST, MRI OF THE SACRUM WITH AND WITHOUT CONTRAST  TECHNIQUE: Multiplanar, multisequence MR imaging of the lumbar spine and sacrum was performed. Contrast was performed for the sacral imaging.  CONTRAST:  10 cc MultiHance  COMPARISON:  CT scan of the abdomen and pelvis dated 01/11/2015 and MRI of the pelvis dated 01/08/2015  FINDINGS: MRI of the lumbar spine:  Normal conus tip at L1-2. There is diffuse edema in the posterior paraspinal musculature and in the gluteal muscles. There is also atrophy of the paraspinal musculature. The patient is quadriplegic. The paraspinal soft tissues  are otherwise normal.  The discs from T11-12 through L5-S1 are normal. There is low signal throughout the bones of the lumbar spine which may represent a red marrow reactivation. There is no evidence of spinal infection. No spinal or foraminal stenosis or facet arthritis.  MRI of the sacrum:  There is abnormal edema and enhancement of the right ischial tuberosity. There is a deep decubitus ulcer that extends to the ischial tuberosity and there is a small abscess extending to enhance cm laterally from the decubitus ulcer at the ischial tuberosity. This pus collection does communicate with the decubitus ulcer.  There is devitalized soft tissue superficial to the left ischial tuberosity at the site of another decubitus ulcer. There is also devitalized soft tissue at the site of the soft tissue ulcer overlying the sacrum. There is slight edema and enhancement of the remnant of the fifth sacral segment and of the entire fourth thick sacral segment but there is no bone destruction.  No evidence of epidural infection in the sacrum.  There is abnormal subcutaneous edema throughout the pelvis with muscle atrophy.   IMPRESSION: 1. Multiple sacral and ischial tuberosity decubitus ulcers. Pus extends from the depths of the right ischial tuberosity ulcer 2.5 cm laterally. 2. Abnormal edema and enhancement of the right ischial tuberosity with loss of the cortical margin, consistent with osteomyelitis. 3. Abnormal edema and enhancement of the fourth sacral segment and of the remnant of the fifth sacral segment. This is immediately adjacent to a sacral decubitus ulcer. This is not definitive for osteomyelitis however.   Electronically Signed   By: Lorriane Shire M.D.   On: 02/19/2015 13:16   Mr Sacrum/si Joints W Wo Contrast  02/19/2015   CLINICAL DATA:  Infected sacral decubitus ulcers. Fever. Leukocytosis.  EXAM: MRI LUMBAR SPINE WITHOUT CONTRAST, MRI OF THE SACRUM WITH AND WITHOUT CONTRAST  TECHNIQUE: Multiplanar, multisequence MR imaging of the lumbar spine and sacrum was performed. Contrast was performed for the sacral imaging.  CONTRAST:  10 cc MultiHance  COMPARISON:  CT scan of the abdomen and pelvis dated 01/11/2015 and MRI of the pelvis dated 01/08/2015  FINDINGS: MRI of the lumbar spine:  Normal conus tip at L1-2. There is diffuse edema in the posterior paraspinal musculature and in the gluteal muscles. There is also atrophy of the paraspinal musculature. The patient is quadriplegic. The paraspinal soft tissues are otherwise normal.  The discs from T11-12 through L5-S1 are normal. There is low signal throughout the bones of the lumbar spine which may represent a red marrow reactivation. There is no evidence of spinal infection. No spinal or foraminal stenosis or facet arthritis.  MRI of the sacrum:  There is abnormal edema and enhancement of the right ischial tuberosity. There is a deep decubitus ulcer that extends to the ischial tuberosity and there is a small abscess extending to enhance cm laterally from the decubitus ulcer at the ischial tuberosity. This pus collection does communicate with the decubitus ulcer.   There is devitalized soft tissue superficial to the left ischial tuberosity at the site of another decubitus ulcer. There is also devitalized soft tissue at the site of the soft tissue ulcer overlying the sacrum. There is slight edema and enhancement of the remnant of the fifth sacral segment and of the entire fourth thick sacral segment but there is no bone destruction.  No evidence of epidural infection in the sacrum.  There is abnormal subcutaneous edema throughout the pelvis with muscle atrophy.  IMPRESSION: 1. Multiple sacral  and ischial tuberosity decubitus ulcers. Pus extends from the depths of the right ischial tuberosity ulcer 2.5 cm laterally. 2. Abnormal edema and enhancement of the right ischial tuberosity with loss of the cortical margin, consistent with osteomyelitis. 3. Abnormal edema and enhancement of the fourth sacral segment and of the remnant of the fifth sacral segment. This is immediately adjacent to a sacral decubitus ulcer. This is not definitive for osteomyelitis however.   Electronically Signed   By: Lorriane Shire M.D.   On: 02/19/2015 13:16   Dg Chest Port 1 View  02/20/2015   CLINICAL DATA:  Patient woke up this morning with chest pain and shortness of breath.  EXAM: PORTABLE CHEST - 1 VIEW  COMPARISON:  CT chest 01/11/2015.  Chest 01/10/2015.  FINDINGS: Borderline heart size with normal pulmonary vascularity. Patchy bilateral basilar airspace disease is improving since previous study. Small bilateral pleural effusions. Right PICC catheter tip is over the cavoatrial junction. No pneumothorax. Old fracture deformity of the right clavicle.  IMPRESSION: Basilar airspace infiltrates are improving since previous study. Small bilateral pleural effusions.   Electronically Signed   By: Lucienne Capers M.D.   On: 02/20/2015 05:42    Scheduled Meds: . aztreonam  2 g Intravenous 3 times per day  . baclofen  10 mg Oral BID WC  . baclofen  20 mg Oral QHS  . collagenase   Topical Daily  .  feeding supplement (ENSURE ENLIVE)  237 mL Oral BID BM  . feeding supplement (GLUCERNA SHAKE)  237 mL Oral TID BM  . [START ON 02/27/2015] fentaNYL  50 mcg Transdermal Q72H  . insulin aspart  0-9 Units Subcutaneous TID WC  . [START ON 02/26/2015] insulin aspart  5 Units Subcutaneous TID WC  . [START ON 02/26/2015] insulin glargine  20 Units Subcutaneous Daily  . loratadine  10 mg Oral Daily  . magnesium oxide  400 mg Oral Daily  . metoCLOPramide  5 mg Oral Daily  . metronidazole  500 mg Intravenous Q8H  . midodrine  10 mg Oral TID WC  . mirtazapine  7.5 mg Oral QHS  . pantoprazole  40 mg Oral Daily  . potassium chloride SA  20 mEq Oral Daily  . pregabalin  100 mg Oral TID WC  . pregabalin  200 mg Oral QHS  . sodium chloride  10-40 mL Intracatheter Q12H  . vancomycin  750 mg Intravenous Q12H   Continuous Infusions: . sodium chloride 75 mL/hr at 02/25/15 1834  . heparin 1,500 Units/hr (02/25/15 1832)    Active Problems:   Osteomyelitis   Anemia   DKA (diabetic ketoacidoses)   Infected wound   Bilateral pulmonary embolism   Palliative care encounter   Pulmonary embolus   Insomnia   Sacral pain    Time spent: 35 minutes   Barton Dubois  [facility] Pager [phone]. If 7PM-7AM, please contact night-coverage at [email or web address], password [facility] 02/25/2015, 10:25 PM  LOS: 7 days

## 2015-02-25 NOTE — Progress Notes (Signed)
Called pharmacy, Carly from pharmacy stated it was ok to hang the vancomycin based on the vancomycin trough.

## 2015-02-26 DIAGNOSIS — E1069 Type 1 diabetes mellitus with other specified complication: Secondary | ICD-10-CM

## 2015-02-26 LAB — GLUCOSE, CAPILLARY
GLUCOSE-CAPILLARY: 264 mg/dL — AB (ref 65–99)
GLUCOSE-CAPILLARY: 153 mg/dL — AB (ref 65–99)
Glucose-Capillary: 142 mg/dL — ABNORMAL HIGH (ref 65–99)
Glucose-Capillary: 221 mg/dL — ABNORMAL HIGH (ref 65–99)
Glucose-Capillary: 229 mg/dL — ABNORMAL HIGH (ref 65–99)
Glucose-Capillary: 426 mg/dL — ABNORMAL HIGH (ref 65–99)

## 2015-02-26 LAB — CBC
HCT: 28 % — ABNORMAL LOW (ref 39.0–52.0)
Hemoglobin: 9.2 g/dL — ABNORMAL LOW (ref 13.0–17.0)
MCH: 26.8 pg (ref 26.0–34.0)
MCHC: 32.9 g/dL (ref 30.0–36.0)
MCV: 81.6 fL (ref 78.0–100.0)
Platelets: 721 10*3/uL — ABNORMAL HIGH (ref 150–400)
RBC: 3.43 MIL/uL — ABNORMAL LOW (ref 4.22–5.81)
RDW: 16.3 % — ABNORMAL HIGH (ref 11.5–15.5)
WBC: 15.5 10*3/uL — AB (ref 4.0–10.5)

## 2015-02-26 LAB — HEPARIN LEVEL (UNFRACTIONATED): HEPARIN UNFRACTIONATED: 0.42 [IU]/mL (ref 0.30–0.70)

## 2015-02-26 LAB — PTH-RELATED PEPTIDE

## 2015-02-26 MED ORDER — ACETAMINOPHEN 325 MG PO TABS
650.0000 mg | ORAL_TABLET | Freq: Four times a day (QID) | ORAL | Status: DC | PRN
  Administered 2015-02-27 – 2015-03-05 (×3): 650 mg via ORAL
  Filled 2015-02-26 (×3): qty 2

## 2015-02-26 MED ORDER — SODIUM CHLORIDE 0.9 % IV BOLUS (SEPSIS)
250.0000 mL | Freq: Once | INTRAVENOUS | Status: AC
  Administered 2015-02-26: 250 mL via INTRAVENOUS

## 2015-02-26 MED ORDER — INSULIN ASPART 100 UNIT/ML ~~LOC~~ SOLN
6.0000 [IU] | Freq: Once | SUBCUTANEOUS | Status: AC
  Administered 2015-02-26: 6 [IU] via SUBCUTANEOUS

## 2015-02-26 MED ORDER — INSULIN GLARGINE 100 UNIT/ML ~~LOC~~ SOLN
25.0000 [IU] | Freq: Every day | SUBCUTANEOUS | Status: DC
  Administered 2015-02-27 – 2015-03-02 (×4): 25 [IU] via SUBCUTANEOUS
  Filled 2015-02-26 (×4): qty 0.25

## 2015-02-26 MED ORDER — OXYCODONE HCL 5 MG PO TABS
10.0000 mg | ORAL_TABLET | Freq: Four times a day (QID) | ORAL | Status: DC | PRN
  Administered 2015-02-26 – 2015-03-05 (×12): 10 mg via ORAL
  Filled 2015-02-26 (×15): qty 2

## 2015-02-26 NOTE — Progress Notes (Signed)
Pt suprapubic catheter is leaking. Per pt mother, Home health nurse placed a 62fr catheter and he needs a 55fr catheter. Will continue to monitor pt.

## 2015-02-26 NOTE — Progress Notes (Signed)
Pt bp was 92/59, hr 83. Notified Dr. Rogue Bussing. Orders placed. will continue to monitor pt.

## 2015-02-26 NOTE — Progress Notes (Addendum)
[facility] PROGRESS NOTE  AARIZ [name] UUV:[id number] DOB: 09-Mar-1986 DOA: 02/18/2015 PCP: [name], MD  Assessment/Plan: Active Problems:   Osteomyelitis   Anemia   DKA (diabetic ketoacidoses)   Infected wound   Bilateral pulmonary embolism   Palliative care encounter   Pulmonary embolus   Insomnia   Sacral pain  Acute Hypoxemic respiratory failure -CT angiogram shows Acute pulmonary embolus involving bilateral lower lobes with extensive consolidation of the bilateral lower lobes as described. The consolidations are most likely impart due to pulmonary infarction but underlying pneumonia is not excluded. 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 Johnnye Sima on board and guiding [name] on antibiotic choice and length  -Last debridement. 12/08/14 by Dr. Migdalia Dk, she has been consulted during this admission and per discussion with her; plan is for hydrotherapy at this moment (started on 5/20); will follow clinical response and follow PT inputs. -Wound care consultation completed, see note from 5/16 -Continue Air mattress -Currently on Aztreonam, Vancomycin, metronidazole -day #8 as per ID rec's  Chronic hypotension: Continue midodrine, dose increased during this admission to help maintaining BP. -will monitor VS  Hyponatremia, sodium has improved after sugar control and IVF's -patient was not eating much at home -advise to keep himself well hydrated and to maintain good PO intake  Severe Protein calorie malnutrition: will follow rec's from dietitian and continue feeding supplements   Chronic  anemia: -Without evidence of acute GI bleeding. But, with high concerns of chronic bleeding from decubitus ulcer -Had 1 unit of PRBC on 3/8.  -Patient received 2 units on 5/14 -will follow Hgb trend -Transfuse for active bleeding or hemoglobin less than 7 -Hgb stable; 9.4  Uncontrolled DM 1: Mild DKA with metabolic acidosis on admission- -refused insulin drip this admission, accepted to take Lantus and sliding scale insulin -patient acidosis and mild DKA now resolved -A1C 8.1 -discussed with mother and patient regarding importance on sugar control and medication compliance -will monitor and continue adjusting hypoglycemic regimen as needed (patient is eating better now); for better control will increase lantus to 25 units and continue meal coverage   Neurogenic bladder:  -Had Klebsiella and BURKHOLDERIA UTI on previous admissions; no dysuria -will monitor -currently on broad spectrum antibiotics  Spinal cord infarction and quadriplegia: -Patient awake alert and oriented 3. -complaining of some neuropathy and unrelieved pain (which he has hx of, especially with diabetes and spinal cord injury) -will continue Lyrica and baclofen -will adjust fentanyl patch, continue oxycodone for breakthrough and add tylenol to help with pain  Insomnia: will continue Remeron (helping with sleep and appetite)  S/p Diverting Colostomy and suprapubic catheter  Reactive thrombocytosis: on heparin drip.  Code Status: full Family Communication: discussed with mother at bedside Disposition Plan: Overall prognosis is extremely grim, plastic surgery consultation and final recommendations pending    Consultants:  Infectious disease  Palliative Care  Procedures:  None  Antibiotics: Aztreonam, vancomycin and flagyl  5/13>>  HPI/Subjective: Patient is AAOX3, reports good appetite, no fever and endorses still pain on his back. Has tolerated well first 2 treatments of hydrotherapy    Objective: Filed Vitals:   02/26/15 0510 02/26/15 0631 02/26/15 0730 02/26/15 1033  BP: 92/59 88/60 100/65 93/59  Pulse: 83 81 84 88  Temp:    98.7 F (37.1 C)  TempSrc:    Oral  Resp: 16   16  Height:      Weight:      SpO2:  100% 100% 100%    Intake/Output Summary (Last 24 hours) at 02/26/15 1210 Last data filed at 02/26/15 0941  Gross per 24 hour  Intake   2006 ml  Output   1900 ml  Net    106 ml    Exam: Gen: no fever. Still Complaining of pain in his back (not completely relived now by current analgesic regimen); better appetite and drinking all feeding supplements. Patient reports he is also sleeping better. Sugar is in mid 200-300 range Lungs: Clear to auscultation bilaterally without wheezes or crackles; good air movement Cardiovascular: Regular rate and rhythm without murmur gallop or rub normal S1 and S2 Abdomen: Nontender, nondistended, soft, bowel sounds positive, no rebound, no ascites, no appreciable mass Extremities: No significant cyanosis, clubbing, or edema bilateral lower extremities Neurological:alert to place and person; + quadriplegia  Skin: Multiple deep wounds to right buttock and perineal region. Large pressure wound to mid back. Wounds w mild, malodorous, yellowish-green drainage. No surrounding cellulitis appreciated. No fluctuance/abscess noted. No crepitus. Some necrotic tissue seen.   Data Reviewed: Basic Metabolic Panel:  Recent Labs Lab 02/20/15 0456 02/20/15 1214 02/21/15 0247 02/22/15 0415 02/23/15 0526 02/25/15 0452  NA 130*  --  137 134* 134* 133*  K 4.6  --  3.9 3.9 3.6 3.5  CL 109  --  116* 111 111 110  CO2 15*  --  16* 18* 18* 20*  GLUCOSE 207*  --  70 187* 265* 281*  BUN 10  --  7 6 6 7   CREATININE 0.52*  --  0.41* 0.46* 0.53* 0.49*  CALCIUM 9.1  --  9.5 9.0 8.7* 8.6*  MG  --  3.0* 1.9  --  1.3*  --     Liver Function Tests:  Recent Labs Lab 02/20/15 0456 02/21/15 0247 02/22/15 0415 02/23/15 0526  AST 16 9* 9*  8*  ALT 9* 8* 6* 7*  ALKPHOS 105 94 101 98  BILITOT 1.1 0.1* 0.3 0.3  PROT 5.7* 6.2* 5.6* 5.5*  ALBUMIN 1.4* 1.5* 1.4* 1.4*   CBC:  Recent Labs Lab 02/22/15 0415 02/23/15 0526 02/24/15 0639 02/25/15 0452 02/26/15 0500  WBC 18.6* 16.0* 16.1* 18.2* 15.5*  HGB 9.9* 9.3* 9.4* 9.4* 9.2*  HCT 31.1* 28.8* 29.1* 29.4* 28.0*  MCV 80.8 80.7 80.6 81.0 81.6  PLT 881* 836* 788* 815* 721*    Cardiac Enzymes:  Recent Labs Lab 02/20/15 0458 02/20/15 1214 02/20/15 1824  TROPONINI <0.03 <0.03 <0.03   CBG:  Recent Labs Lab 02/25/15 1943 02/26/15 0008 02/26/15 0417 02/26/15 0758 02/26/15 1157  GLUCAP 370* 426* 264* 221* 229*    Recent Results (from the past 240 hour(s))  Blood culture (routine x 2)     Status: None   Collection Time: 02/18/15  4:15 PM  Result Value Ref Range Status   Specimen Description BLOOD RIGHT ANTECUBITAL  Final   Special Requests BOTTLES DRAWN AEROBIC ONLY 2CC  Final   Culture   Final    NO GROWTH 5 DAYS Performed at [facility]    Report Status 02/25/2015 FINAL  Final  Blood culture (routine x 2)     Status: None   Collection Time: 02/18/15  4:18 PM  Result Value Ref Range Status   Specimen  Description BLOOD LEFT ANTECUBITAL  Final   Special Requests BOTTLES DRAWN AEROBIC AND ANAEROBIC 5CC  Final   Culture   Final    NO GROWTH 5 DAYS Performed at [facility]    Report Status 02/25/2015 FINAL  Final  MRSA PCR Screening     Status: None   Collection Time: 02/18/15  8:33 PM  Result Value Ref Range Status   MRSA by PCR NEGATIVE NEGATIVE Final    Comment:        The GeneXpert MRSA Assay (FDA approved for NASAL specimens only), is one component of a comprehensive MRSA colonization surveillance program. It is not intended to diagnose MRSA infection nor to guide or monitor treatment for MRSA infections.   Wound culture     Status: None   Collection Time: 02/21/15  6:32 AM  Result Value Ref Range Status   Specimen  Description WOUND SACRAL  Final   Special Requests Normal  Final   Gram Stain   Final    NO WBC SEEN NO SQUAMOUS EPITHELIAL CELLS SEEN FEW GRAM VARIABLE ROD Performed at [facility]    Culture   Final    MULTIPLE ORGANISMS PRESENT, NONE PREDOMINANT Note: NO STAPHYLOCOCCUS AUREUS ISOLATED NO GROUP A STREP (S.PYOGENES) ISOLATED Performed at [facility]    Report Status 02/24/2015 FINAL  Final     Studies: Ct Angio Chest Pe W/cm &/or Wo Cm  02/20/2015   CLINICAL DATA:  Acute respiratory failure, hypoxia, shortness of breath, chest pain.  EXAM: CT ANGIOGRAPHY CHEST WITH CONTRAST  TECHNIQUE: Multidetector CT imaging of the chest was performed using the standard protocol during bolus administration of intravenous contrast. Multiplanar CT image reconstructions and MIPs were obtained to evaluate the vascular anatomy.  CONTRAST:  54mL OMNIPAQUE IOHEXOL 350 MG/ML SOLN  COMPARISON:  January 11, 2015  FINDINGS: There is acute pulmonary embolus in segmental and subsegmental pulmonary arteries of the left lower lobe. There is acute pulmonary embolus in subsegmental pulmonary arteries of the right lower lobe. There are extensive consolidation of the right lower lobe and the medial aspect of the left lower lobe. There are small bilateral pleural effusions. There are lymphadenopathy in the mediastinum. The aorta is normal. There is small pericardial effusion. The visualized upper abdominal structures are normal.  Review of the MIP images confirms the above findings.  IMPRESSION: Acute pulmonary embolus involving bilateral lower lobes with extensive consolidation of the bilateral lower lobes as described. The consolidations are most likely impart due to pulmonary infarction but underlying pneumonia is not excluded.  Critical Value/emergent results were called by telephone at the time of interpretation on 02/20/2015 at 8:33 am to patient's nurse Clarisa Fling who verbally acknowledged these results.    Electronically Signed   By: Abelardo Diesel M.D.   On: 02/20/2015 08:40   Mr Lumbar Spine Wo Contrast  02/19/2015   CLINICAL DATA:  Infected sacral decubitus ulcers. Fever. Leukocytosis.  EXAM: MRI LUMBAR SPINE WITHOUT CONTRAST, MRI OF THE SACRUM WITH AND WITHOUT CONTRAST  TECHNIQUE: Multiplanar, multisequence MR imaging of the lumbar spine and sacrum was performed. Contrast was performed for the sacral imaging.  CONTRAST:  10 cc MultiHance  COMPARISON:  CT scan of the abdomen and pelvis dated 01/11/2015 and MRI of the pelvis dated 01/08/2015  FINDINGS: MRI of the lumbar spine:  Normal conus tip at L1-2. There is diffuse edema in the posterior paraspinal musculature and in the gluteal muscles. There is also atrophy of the paraspinal musculature.  The patient is quadriplegic. The paraspinal soft tissues are otherwise normal.  The discs from T11-12 through L5-S1 are normal. There is low signal throughout the bones of the lumbar spine which may represent a red marrow reactivation. There is no evidence of spinal infection. No spinal or foraminal stenosis or facet arthritis.  MRI of the sacrum:  There is abnormal edema and enhancement of the right ischial tuberosity. There is a deep decubitus ulcer that extends to the ischial tuberosity and there is a small abscess extending to enhance cm laterally from the decubitus ulcer at the ischial tuberosity. This pus collection does communicate with the decubitus ulcer.  There is devitalized soft tissue superficial to the left ischial tuberosity at the site of another decubitus ulcer. There is also devitalized soft tissue at the site of the soft tissue ulcer overlying the sacrum. There is slight edema and enhancement of the remnant of the fifth sacral segment and of the entire fourth thick sacral segment but there is no bone destruction.  No evidence of epidural infection in the sacrum.  There is abnormal subcutaneous edema throughout the pelvis with muscle atrophy.   IMPRESSION: 1. Multiple sacral and ischial tuberosity decubitus ulcers. Pus extends from the depths of the right ischial tuberosity ulcer 2.5 cm laterally. 2. Abnormal edema and enhancement of the right ischial tuberosity with loss of the cortical margin, consistent with osteomyelitis. 3. Abnormal edema and enhancement of the fourth sacral segment and of the remnant of the fifth sacral segment. This is immediately adjacent to a sacral decubitus ulcer. This is not definitive for osteomyelitis however.   Electronically Signed   By: Lorriane Shire M.D.   On: 02/19/2015 13:16   Mr Sacrum/si Joints W Wo Contrast  02/19/2015   CLINICAL DATA:  Infected sacral decubitus ulcers. Fever. Leukocytosis.  EXAM: MRI LUMBAR SPINE WITHOUT CONTRAST, MRI OF THE SACRUM WITH AND WITHOUT CONTRAST  TECHNIQUE: Multiplanar, multisequence MR imaging of the lumbar spine and sacrum was performed. Contrast was performed for the sacral imaging.  CONTRAST:  10 cc MultiHance  COMPARISON:  CT scan of the abdomen and pelvis dated 01/11/2015 and MRI of the pelvis dated 01/08/2015  FINDINGS: MRI of the lumbar spine:  Normal conus tip at L1-2. There is diffuse edema in the posterior paraspinal musculature and in the gluteal muscles. There is also atrophy of the paraspinal musculature. The patient is quadriplegic. The paraspinal soft tissues are otherwise normal.  The discs from T11-12 through L5-S1 are normal. There is low signal throughout the bones of the lumbar spine which may represent a red marrow reactivation. There is no evidence of spinal infection. No spinal or foraminal stenosis or facet arthritis.  MRI of the sacrum:  There is abnormal edema and enhancement of the right ischial tuberosity. There is a deep decubitus ulcer that extends to the ischial tuberosity and there is a small abscess extending to enhance cm laterally from the decubitus ulcer at the ischial tuberosity. This pus collection does communicate with the decubitus ulcer.   There is devitalized soft tissue superficial to the left ischial tuberosity at the site of another decubitus ulcer. There is also devitalized soft tissue at the site of the soft tissue ulcer overlying the sacrum. There is slight edema and enhancement of the remnant of the fifth sacral segment and of the entire fourth thick sacral segment but there is no bone destruction.  No evidence of epidural infection in the sacrum.  There is abnormal subcutaneous edema throughout the pelvis  with muscle atrophy.  IMPRESSION: 1. Multiple sacral and ischial tuberosity decubitus ulcers. Pus extends from the depths of the right ischial tuberosity ulcer 2.5 cm laterally. 2. Abnormal edema and enhancement of the right ischial tuberosity with loss of the cortical margin, consistent with osteomyelitis. 3. Abnormal edema and enhancement of the fourth sacral segment and of the remnant of the fifth sacral segment. This is immediately adjacent to a sacral decubitus ulcer. This is not definitive for osteomyelitis however.   Electronically Signed   By: Lorriane Shire M.D.   On: 02/19/2015 13:16   Dg Chest Port 1 View  02/20/2015   CLINICAL DATA:  Patient woke up this morning with chest pain and shortness of breath.  EXAM: PORTABLE CHEST - 1 VIEW  COMPARISON:  CT chest 01/11/2015.  Chest 01/10/2015.  FINDINGS: Borderline heart size with normal pulmonary vascularity. Patchy bilateral basilar airspace disease is improving since previous study. Small bilateral pleural effusions. Right PICC catheter tip is over the cavoatrial junction. No pneumothorax. Old fracture deformity of the right clavicle.  IMPRESSION: Basilar airspace infiltrates are improving since previous study. Small bilateral pleural effusions.   Electronically Signed   By: Lucienne Capers M.D.   On: 02/20/2015 05:42    Scheduled Meds: . aztreonam  2 g Intravenous 3 times per day  . baclofen  10 mg Oral BID WC  . baclofen  20 mg Oral QHS  . collagenase   Topical Daily  .  feeding supplement (ENSURE ENLIVE)  237 mL Oral BID BM  . feeding supplement (GLUCERNA SHAKE)  237 mL Oral TID BM  . [START ON 02/27/2015] fentaNYL  50 mcg Transdermal Q72H  . insulin aspart  0-9 Units Subcutaneous TID WC  . insulin aspart  5 Units Subcutaneous TID WC  . [START ON 02/27/2015] insulin glargine  25 Units Subcutaneous Daily  . loratadine  10 mg Oral Daily  . magnesium oxide  400 mg Oral Daily  . metoCLOPramide  5 mg Oral Daily  . metronidazole  500 mg Intravenous Q8H  . midodrine  10 mg Oral TID WC  . mirtazapine  7.5 mg Oral QHS  . pantoprazole  40 mg Oral Daily  . potassium chloride SA  20 mEq Oral Daily  . pregabalin  100 mg Oral TID WC  . pregabalin  200 mg Oral QHS  . sodium chloride  10-40 mL Intracatheter Q12H  . vancomycin  750 mg Intravenous Q12H   Continuous Infusions: . sodium chloride 75 mL/hr at 02/25/15 1834  . heparin 1,500 Units/hr (02/25/15 1832)    Active Problems:   Osteomyelitis   Anemia   DKA (diabetic ketoacidoses)   Infected wound   Bilateral pulmonary embolism   Palliative care encounter   Pulmonary embolus   Insomnia   Sacral pain    Time spent: 35 minutes   Barton Dubois  [facility] Pager [phone]. If 7PM-7AM, please contact night-coverage at [email or web address], password [facility] 02/26/2015, 12:10 PM  LOS: 8 days

## 2015-02-26 NOTE — Progress Notes (Signed)
Physical Therapy Wound Treatment Patient Details  Name: Jason [name] MRN: [id number] Date of Birth: 05-19-1986  Today's Date: 02/26/2015 Time:950  - 1026   Total Time = 36 minutes  Subjective  Subjective: Talkative.  C/o pain. Patient and Family Stated Goals: Wounds to heal.  Date of Onset:  (pt unsure exact onset, but states "months".) Prior Treatments: I+D by plastics and dressing changes with Santyl.    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 (Active) Sacrum  Dressing Type ABD;Barrier Film (skin prep);Gauze (Comment);Tape dressing 02/26/2015  6:28 PM  Dressing Changed 02/26/2015  6:28 PM  Dressing Change Frequency Twice a day 02/26/2015  6:28 PM  State of Healing Early/partial granulation 02/26/2015  6:28 PM  Site / Wound Assessment Bleeding;Yellow;Pink;Red 02/26/2015  6:28 PM  % Wound base Red or Granulating 50% 02/26/2015  6:28 PM  % Wound base Yellow 40% 02/26/2015  6:28 PM  % Wound base Black 0% 02/26/2015  6:28 PM  % Wound base Other (Comment) 10% 02/26/2015  6:28 PM  Peri-wound Assessment Intact 02/26/2015  6:28 PM  Margins Unattached edges (unapproximated) 02/26/2015  6:28 PM  Drainage Amount Copious 02/26/2015  6:28 PM  Drainage Description Serosanguineous;Odor 02/26/2015  6:28 PM  Treatment Debridement (Selective);Hydrotherapy (Pulse lavage);Packing (Saline gauze) 02/26/2015  6:28 PM     Pressure Ulcer 02/20/15 Unstageable - Full thickness tissue loss in which the base of the ulcer is covered by slough (yellow, tan, gray, green or brown) and/or eschar (tan, brown or black) in the wound bed. (Active) Lt  Ischium  Dressing Type Barrier Film (skin prep);Gauze (Comment);Moist to dry;Tape dressing 02/26/2015  6:28 PM  Dressing Changed 02/26/2015  6:28 PM  Dressing Change Frequency Daily 02/26/2015  6:28 PM  State of Healing Eschar 02/26/2015  6:28 PM  Site / Wound Assessment  Brown;Pink;Yellow;Black 02/26/2015  6:28 PM  % Wound base Red or Granulating 10% 02/26/2015  6:28 PM  % Wound base Yellow 5% 02/26/2015  6:28 PM  % Wound base Black 85% 02/26/2015  6:28 PM  Peri-wound Assessment Intact 02/26/2015  6:28 PM  Margins Unattached edges (unapproximated) 02/26/2015  6:28 PM  Drainage Amount Minimal 02/26/2015  6:28 PM  Drainage Description Odor 02/26/2015  6:28 PM  Treatment Debridement (Selective);Hydrotherapy (Pulse lavage);Packing (Saline gauze) 02/26/2015  6:28 PM     Wound / Incision (Open or Dehisced) 02/25/15 Incision - Open Ischial tuberosity Right Right Ischium (Active)  Dressing Type Barrier Film (skin prep);Gauze (Comment);Moist to dry;Tape dressing 02/26/2015  6:28 PM  Dressing Changed Changed 02/26/2015  6:28 PM  Dressing Change Frequency Daily 02/26/2015  6:28 PM  Site / Wound Assessment Bleeding;Pink;Red;Yellow 02/26/2015  6:28 PM  % Wound base Red or Granulating 70% 02/26/2015  6:28 PM  % Wound base Yellow 30% 02/26/2015  6:28 PM  Peri-wound Assessment Intact 02/26/2015  6:28 PM  Margins Unattached edges (unapproximated) 02/26/2015  6:28 PM  Closure None 02/26/2015  6:28 PM  Drainage Amount Moderate 02/26/2015  6:28 PM  Drainage Description Serosanguineous;Odor 02/26/2015  6:28 PM  Treatment Hydrotherapy (Pulse lavage);Packing (Saline gauze) 02/26/2015  6:28 PM      Hydrotherapy Pulsed lavage therapy - wound location: Sacrum and Bil Ischial Pulsed Lavage with Suction (psi): 8 psi Pulsed Lavage with Suction - Normal Saline Used: 1000 mL Pulsed Lavage Tip: Tip with splash shield Selective Debridement Selective Debridement - Location: Sacral and Lt ischial tuberosity  wounds Selective Debridement - Tools Used: Forceps;Scissors Selective Debridement - Tissue Removed: Yellow slough; brown eschar   Wound Assessment and Plan  Wound Therapy - Assess/Plan/Recommendations Wound Therapy - Clinical Statement: Some loosening of eschar on Lt ischial tuberosity wound - able  to debride. Wound Therapy - Functional Problem List: Multiple wounds and pressure sores. Factors Delaying/Impairing Wound Healing: Altered sensation;Immobility;Polypharmacy Hydrotherapy Plan: Debridement;Dressing change;Patient/family education;Pulsatile lavage with suction Wound Therapy - Frequency: 6X / week Wound Therapy - Current Recommendations: Other (comment) (Continue with Plastics as outpatient.) Wound Therapy - Follow Up Recommendations: Other (comment) (Per MD orders.) Wound Plan: See above.  Wound Therapy Goals- Improve the function of patient's integumentary system by progressing the wound(s) through the phases of wound healing (inflammation - proliferation - remodeling) by: Decrease Necrotic Tissue to: 30 Decrease Necrotic Tissue - Progress: Progressing toward goal Increase Granulation Tissue to: 70 Increase Granulation Tissue - Progress: Progressing toward goal Improve Drainage Characteristics: Min Improve Drainage Characteristics - Progress: Progressing toward goal Patient/Family will be able to : pt/family to state and perform dressing changes.    Goals will be updated until maximal potential achieved or discharge criteria met.  Discharge criteria: when goals achieved, discharge from hospital, MD decision/surgical intervention, no progress towards goals, refusal/missing three consecutive treatments without notification or medical reason.  GP     Despina Pole 02/26/2015, 6:59 PM Carita Pian. Sanjuana Kava, [place] Pager [phone]

## 2015-02-26 NOTE — Progress Notes (Signed)
Pt received 250cc bolus of Normal saline. Bp 88/60, hr 81. Notified Dr. Rogue Bussing. Orders placed. Will continue to monitor and make day RN aware.

## 2015-02-26 NOTE — Progress Notes (Signed)
Pt CBG 426, no HS correction insulin. Paged MD, awaiting callback or orders to be placed. Will continue monitor.

## 2015-02-26 NOTE — Progress Notes (Signed)
PHARMACY NOTE  Pharmacy Consult :  29 y.o. male is currently on Heparin infusion for pulmonary embolism .   Heparin Dosing Wt :  50.8 kg  LABS :  Recent Labs  02/24/15 0639 02/25/15 0452 02/26/15 0500  HGB 9.4* 9.4* 9.2*  HCT 29.1* 29.4* 28.0*  PLT 788* 815* 721*  HEPARINUNFRC 0.39 0.40 0.42  CREATININE  --  0.49*  --     MEDICATION: Infusion[s]: Infusions:  . sodium chloride 75 mL/hr at 02/25/15 1834  . heparin 1,500 Units/hr (02/25/15 1832)   ASSESSMENT :  29 y.o. male is currently on [place] infusion for pulmonary embolism    Heparin infusing at 1500 units/hr.    Heparin level steady within therapeutic range at 0.42 units/ml.  No evidence of bleeding complications observed.  GOAL :  Heparin Level  0.3 - 0.7 units/ml  PLAN : 1. Heparin will be continued at the same rate, 1500 units/hr.  Will check Heparin level with AM Labs 2. Daily Heparin level, CBC while on Heparin.  Monitor for bleeding complications. Follow Platelet counts.  Marthenia Rolling,  Pharm.D   02/26/2015,  12:07 PM

## 2015-02-27 LAB — CBC
HEMATOCRIT: 26.4 % — AB (ref 39.0–52.0)
Hemoglobin: 8.4 g/dL — ABNORMAL LOW (ref 13.0–17.0)
MCH: 25.8 pg — ABNORMAL LOW (ref 26.0–34.0)
MCHC: 31.8 g/dL (ref 30.0–36.0)
MCV: 81.2 fL (ref 78.0–100.0)
Platelets: 674 10*3/uL — ABNORMAL HIGH (ref 150–400)
RBC: 3.25 MIL/uL — ABNORMAL LOW (ref 4.22–5.81)
RDW: 16.6 % — AB (ref 11.5–15.5)
WBC: 16 10*3/uL — AB (ref 4.0–10.5)

## 2015-02-27 LAB — GLUCOSE, CAPILLARY
GLUCOSE-CAPILLARY: 63 mg/dL — AB (ref 65–99)
GLUCOSE-CAPILLARY: 180 mg/dL — AB (ref 65–99)
Glucose-Capillary: 167 mg/dL — ABNORMAL HIGH (ref 65–99)
Glucose-Capillary: 151 mg/dL — ABNORMAL HIGH (ref 65–99)
Glucose-Capillary: 156 mg/dL — ABNORMAL HIGH (ref 65–99)
Glucose-Capillary: 111 mg/dL — ABNORMAL HIGH (ref 65–99)
Glucose-Capillary: 142 mg/dL — ABNORMAL HIGH (ref 65–99)

## 2015-02-27 LAB — HEPARIN LEVEL (UNFRACTIONATED): Heparin Unfractionated: 0.35 IU/mL (ref 0.30–0.70)

## 2015-02-27 NOTE — Progress Notes (Signed)
[facility] PROGRESS NOTE  Jason [name] RDE:[id number] DOB: Mar 21, 1986 DOA: 02/18/2015 PCP: [name], MD  Assessment/Plan: Active Problems:   Osteomyelitis   Anemia   DKA (diabetic ketoacidoses)   Infected wound   Bilateral pulmonary embolism   Palliative care encounter   Pulmonary embolus   Insomnia   Sacral pain  Acute Hypoxemic respiratory failure -CT angiogram shows Acute pulmonary embolus involving bilateral lower lobes with extensive consolidation of the bilateral lower lobes as described. The consolidations are most likely impart due to pulmonary infarction but underlying pneumonia is not excluded. 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 Johnnye Sima on board and guiding [name] on antibiotic choice and length  -Last debridement. 12/08/14 by Dr. Migdalia Dk, she has been consulted during this admission and per discussion with her; plan is for hydrotherapy at this moment (started on 5/20); will follow clinical response and follow PT inputs. -Wound care consultation completed, see note from 5/16 -Continue Air mattress -Currently on Aztreonam, Vancomycin, metronidazole -day #9 as per ID rec's -low grade temp overnight   Chronic hypotension: Continue midodrine, dose increased during this admission to help maintaining BP. -will monitor VS  Hyponatremia, sodium has improved after sugar control and IVF's -patient was not eating much at home -advise to keep himself well hydrated and to maintain good PO intake  Severe Protein calorie malnutrition: will follow rec's from dietitian and continue feeding supplements    Chronic anemia: -Without evidence of acute GI bleeding. But, with high concerns of chronic bleeding from decubitus ulcer -Had 1 unit of PRBC on 3/8.  -Patient received 2 units on 5/14 -will follow Hgb trend -Transfuse for active bleeding or hemoglobin less than 7 -Hgb stable; 9.4  Uncontrolled DM 1: Mild DKA with metabolic acidosis on admission- -patient acidosis and mild DKA now resolved -A1C 8.1 -discussed with mother and patient regarding importance on sugar control and medication compliance -will monitor and continue adjusting hypoglycemic regimen as needed; CBG's now in 150-180 -will continue SSI, lantus 25 units and 5 units novolog TID for meal coverage   Neurogenic bladder:  -Had Klebsiella and BURKHOLDERIA UTI on previous admissions; no dysuria -will monitor -currently on broad spectrum antibiotics  Spinal cord infarction and quadriplegia: -Patient awake alert and oriented 3. -complaining of some neuropathy and unrelieved pain (which he has hx of, especially with diabetes and spinal cord injury) -will continue Lyrica and baclofen -will adjust fentanyl patch, continue oxycodone for breakthrough and add tylenol to help with pain  Insomnia: will continue Remeron (helping with sleep and appetite)  S/p Diverting Colostomy and suprapubic catheter  Reactive thrombocytosis: on heparin drip.  Code Status: full Family Communication: discussed with mother at bedside Disposition Plan: Overall prognosis is extremely grim, plastic surgery consultation and final recommendations pending    Consultants:  Infectious disease  Palliative Care  Procedures:  None  Antibiotics: Aztreonam, vancomycin and flagyl  5/13>>  HPI/Subjective: Patient is AAOX3, reports good appetite and endorses that even still pain with on his back, new pain regimen is controlling pain better. Has tolerated well first 3 treatments of hydrotherapy   Objective: Filed Vitals:   02/27/15 0330  02/27/15 0424 02/27/15 1017 02/27/15 1355  BP:  104/67 104/67 111/74  Pulse:  95 93 90  Temp: 100.3 F (37.9 C) 98 F (36.7 C) 98.9 F (37.2 C) 98.1 F (36.7 C)  TempSrc: Oral Oral Oral Oral  Resp:  18 12 16   Height:      Weight:      SpO2:  98% 100% 100%    Intake/Output Summary (Last 24 hours) at 02/27/15 1503 Last data filed at 02/27/15 1312  Gross per 24 hour  Intake   1780 ml  Output   5700 ml  Net  -3920 ml    Exam: Gen: low grade fever overnight. Still Complaining of pain in his back (but endorses that new regimen is controlling pain better); better appetite and drinking all feeding supplements. Patient reports he is also sleeping better. Sugar int he 150-180 now Lungs: Clear to auscultation bilaterally without wheezes or crackles; good air movement Cardiovascular: Regular rate and rhythm without murmur gallop or rub normal S1 and S2 Abdomen: Nontender, nondistended, soft, bowel sounds positive, no rebound, no ascites, no appreciable mass Extremities: No significant cyanosis, clubbing, or edema bilateral lower extremities Neurological:alert to place and person; + quadriplegia  Skin: Multiple deep wounds to right buttock and perineal region. Large pressure wound to mid back. Wounds w mild, malodorous, yellowish-green drainage. No surrounding cellulitis appreciated. No fluctuance/abscess noted. No crepitus. Some necrotic tissue seen.   Data Reviewed: Basic Metabolic Panel:  Recent Labs Lab 02/21/15 0247 02/22/15 0415 02/23/15 0526 02/25/15 0452  NA 137 134* 134* 133*  K 3.9 3.9 3.6 3.5  CL 116* 111 111 110  CO2 16* 18* 18* 20*  GLUCOSE 70 187* 265* 281*  BUN 7 6 6 7   CREATININE 0.41* 0.46* 0.53* 0.49*  CALCIUM 9.5 9.0 8.7* 8.6*  MG 1.9  --  1.3*  --     Liver Function Tests:  Recent Labs Lab 02/21/15 0247 02/22/15 0415 02/23/15 0526  AST 9* 9* 8*  ALT 8* 6* 7*  ALKPHOS 94 101 98  BILITOT 0.1* 0.3 0.3  PROT 6.2* 5.6* 5.5*  ALBUMIN 1.5* 1.4* 1.4*    CBC:  Recent Labs Lab 02/23/15 0526 02/24/15 0639 02/25/15 0452 02/26/15 0500 02/27/15 0642  WBC 16.0* 16.1* 18.2* 15.5* 16.0*  HGB 9.3* 9.4* 9.4* 9.2* 8.4*  HCT 28.8* 29.1* 29.4* 28.0* 26.4*  MCV 80.7 80.6 81.0 81.6 81.2  PLT 836* 788* 815* 721* 674*    Cardiac Enzymes:  Recent Labs Lab 02/20/15 1824  TROPONINI <0.03   CBG:  Recent Labs Lab 02/26/15 2024 02/27/15 0142 02/27/15 0422 02/27/15 0742 02/27/15 1206  GLUCAP 153* 167* 151* 156* 111*    Recent Results (from the past 240 hour(s))  Blood culture (routine x 2)     Status: None   Collection Time: 02/18/15  4:15 PM  Result Value Ref Range Status   Specimen Description BLOOD RIGHT ANTECUBITAL  Final   Special Requests BOTTLES DRAWN AEROBIC ONLY 2CC  Final   Culture   Final    NO GROWTH 5 DAYS Performed at [facility]    Report Status 02/25/2015 FINAL  Final  Blood culture (routine x 2)     Status: None   Collection Time: 02/18/15  4:18 PM  Result Value Ref Range Status   Specimen Description BLOOD LEFT ANTECUBITAL  Final   Special Requests BOTTLES DRAWN AEROBIC AND ANAEROBIC 5CC  Final   Culture   Final    NO GROWTH 5 DAYS Performed at [facility]    Report Status 02/25/2015 FINAL  Final  MRSA PCR Screening     Status: None   Collection Time: 02/18/15  8:33 PM  Result Value Ref Range Status   MRSA by PCR NEGATIVE NEGATIVE Final    Comment:        The GeneXpert MRSA Assay (FDA approved for NASAL specimens only), is one component of a comprehensive MRSA colonization surveillance program. It is not intended to diagnose MRSA infection nor to guide or monitor treatment for MRSA infections.   Wound culture     Status: None   Collection Time: 02/21/15  6:32 AM  Result Value Ref Range Status   Specimen Description WOUND SACRAL  Final   Special Requests Normal  Final   Gram Stain   Final    NO WBC SEEN NO SQUAMOUS EPITHELIAL CELLS SEEN FEW GRAM VARIABLE ROD Performed at  [facility]    Culture   Final    MULTIPLE ORGANISMS PRESENT, NONE PREDOMINANT Note: NO STAPHYLOCOCCUS AUREUS ISOLATED NO GROUP A STREP (S.PYOGENES) ISOLATED Performed at [facility]    Report Status 02/24/2015 FINAL  Final     Studies: Ct Angio Chest Pe W/cm &/or Wo Cm  02/20/2015   CLINICAL DATA:  Acute respiratory failure, hypoxia, shortness of breath, chest pain.  EXAM: CT ANGIOGRAPHY CHEST WITH CONTRAST  TECHNIQUE: Multidetector CT imaging of the chest was performed using the standard protocol during bolus administration of intravenous contrast. Multiplanar CT image reconstructions and MIPs were obtained to evaluate the vascular anatomy.  CONTRAST:  25mL OMNIPAQUE IOHEXOL 350 MG/ML SOLN  COMPARISON:  January 11, 2015  FINDINGS: There is acute pulmonary embolus in segmental and subsegmental pulmonary arteries of the left lower lobe. There is acute pulmonary embolus in subsegmental pulmonary arteries of the right lower lobe. There are extensive consolidation of the right lower lobe and the medial aspect of the left lower lobe. There are small bilateral pleural effusions. There are lymphadenopathy in the mediastinum. The aorta is normal. There is small pericardial effusion. The visualized upper abdominal structures are normal.  Review of the MIP images confirms the above findings.  IMPRESSION: Acute pulmonary embolus involving bilateral lower lobes with extensive consolidation of the bilateral lower lobes as described. The consolidations are most likely impart due to pulmonary infarction but underlying pneumonia is not excluded.  Critical Value/emergent results were called by telephone at the time of interpretation on 02/20/2015 at 8:33 am to patient's nurse Clarisa Fling who verbally acknowledged these results.   Electronically Signed   By: Abelardo Diesel M.D.   On: 02/20/2015 08:40   Mr Lumbar Spine Wo Contrast  02/19/2015   CLINICAL DATA:  Infected sacral decubitus ulcers. Fever.  Leukocytosis.  EXAM: MRI LUMBAR SPINE WITHOUT CONTRAST, MRI OF THE SACRUM WITH AND WITHOUT CONTRAST  TECHNIQUE: Multiplanar, multisequence MR imaging of the lumbar spine and sacrum was performed. Contrast was performed for the sacral imaging.  CONTRAST:  10 cc MultiHance  COMPARISON:  CT scan of the abdomen and pelvis dated 01/11/2015 and MRI of the pelvis dated 01/08/2015  FINDINGS: MRI of the lumbar spine:  Normal conus tip at L1-2. There is diffuse edema in the posterior paraspinal musculature and in the gluteal muscles. There is also atrophy of the paraspinal musculature. The patient is quadriplegic. The paraspinal soft tissues are otherwise normal.  The discs from T11-12 through L5-S1 are normal. There is low signal throughout the bones of the lumbar spine which may represent a red marrow reactivation. There is no evidence of spinal infection. No  spinal or foraminal stenosis or facet arthritis.  MRI of the sacrum:  There is abnormal edema and enhancement of the right ischial tuberosity. There is a deep decubitus ulcer that extends to the ischial tuberosity and there is a small abscess extending to enhance cm laterally from the decubitus ulcer at the ischial tuberosity. This pus collection does communicate with the decubitus ulcer.  There is devitalized soft tissue superficial to the left ischial tuberosity at the site of another decubitus ulcer. There is also devitalized soft tissue at the site of the soft tissue ulcer overlying the sacrum. There is slight edema and enhancement of the remnant of the fifth sacral segment and of the entire fourth thick sacral segment but there is no bone destruction.  No evidence of epidural infection in the sacrum.  There is abnormal subcutaneous edema throughout the pelvis with muscle atrophy.  IMPRESSION: 1. Multiple sacral and ischial tuberosity decubitus ulcers. Pus extends from the depths of the right ischial tuberosity ulcer 2.5 cm laterally. 2. Abnormal edema and  enhancement of the right ischial tuberosity with loss of the cortical margin, consistent with osteomyelitis. 3. Abnormal edema and enhancement of the fourth sacral segment and of the remnant of the fifth sacral segment. This is immediately adjacent to a sacral decubitus ulcer. This is not definitive for osteomyelitis however.   Electronically Signed   By: Lorriane Shire M.D.   On: 02/19/2015 13:16   Mr Sacrum/si Joints W Wo Contrast  02/19/2015   CLINICAL DATA:  Infected sacral decubitus ulcers. Fever. Leukocytosis.  EXAM: MRI LUMBAR SPINE WITHOUT CONTRAST, MRI OF THE SACRUM WITH AND WITHOUT CONTRAST  TECHNIQUE: Multiplanar, multisequence MR imaging of the lumbar spine and sacrum was performed. Contrast was performed for the sacral imaging.  CONTRAST:  10 cc MultiHance  COMPARISON:  CT scan of the abdomen and pelvis dated 01/11/2015 and MRI of the pelvis dated 01/08/2015  FINDINGS: MRI of the lumbar spine:  Normal conus tip at L1-2. There is diffuse edema in the posterior paraspinal musculature and in the gluteal muscles. There is also atrophy of the paraspinal musculature. The patient is quadriplegic. The paraspinal soft tissues are otherwise normal.  The discs from T11-12 through L5-S1 are normal. There is low signal throughout the bones of the lumbar spine which may represent a red marrow reactivation. There is no evidence of spinal infection. No spinal or foraminal stenosis or facet arthritis.  MRI of the sacrum:  There is abnormal edema and enhancement of the right ischial tuberosity. There is a deep decubitus ulcer that extends to the ischial tuberosity and there is a small abscess extending to enhance cm laterally from the decubitus ulcer at the ischial tuberosity. This pus collection does communicate with the decubitus ulcer.  There is devitalized soft tissue superficial to the left ischial tuberosity at the site of another decubitus ulcer. There is also devitalized soft tissue at the site of the soft  tissue ulcer overlying the sacrum. There is slight edema and enhancement of the remnant of the fifth sacral segment and of the entire fourth thick sacral segment but there is no bone destruction.  No evidence of epidural infection in the sacrum.  There is abnormal subcutaneous edema throughout the pelvis with muscle atrophy.  IMPRESSION: 1. Multiple sacral and ischial tuberosity decubitus ulcers. Pus extends from the depths of the right ischial tuberosity ulcer 2.5 cm laterally. 2. Abnormal edema and enhancement of the right ischial tuberosity with loss of the cortical margin, consistent with osteomyelitis.  3. Abnormal edema and enhancement of the fourth sacral segment and of the remnant of the fifth sacral segment. This is immediately adjacent to a sacral decubitus ulcer. This is not definitive for osteomyelitis however.   Electronically Signed   By: Lorriane Shire M.D.   On: 02/19/2015 13:16   Dg Chest Port 1 View  02/20/2015   CLINICAL DATA:  Patient woke up this morning with chest pain and shortness of breath.  EXAM: PORTABLE CHEST - 1 VIEW  COMPARISON:  CT chest 01/11/2015.  Chest 01/10/2015.  FINDINGS: Borderline heart size with normal pulmonary vascularity. Patchy bilateral basilar airspace disease is improving since previous study. Small bilateral pleural effusions. Right PICC catheter tip is over the cavoatrial junction. No pneumothorax. Old fracture deformity of the right clavicle.  IMPRESSION: Basilar airspace infiltrates are improving since previous study. Small bilateral pleural effusions.   Electronically Signed   By: Lucienne Capers M.D.   On: 02/20/2015 05:42    Scheduled Meds: . aztreonam  2 g Intravenous 3 times per day  . baclofen  10 mg Oral BID WC  . baclofen  20 mg Oral QHS  . collagenase   Topical Daily  . feeding supplement (ENSURE ENLIVE)  237 mL Oral BID BM  . feeding supplement (GLUCERNA SHAKE)  237 mL Oral TID BM  . fentaNYL  50 mcg Transdermal Q72H  . insulin aspart  0-9  Units Subcutaneous TID WC  . insulin aspart  5 Units Subcutaneous TID WC  . insulin glargine  25 Units Subcutaneous Daily  . loratadine  10 mg Oral Daily  . magnesium oxide  400 mg Oral Daily  . metoCLOPramide  5 mg Oral Daily  . metronidazole  500 mg Intravenous Q8H  . midodrine  10 mg Oral TID WC  . mirtazapine  7.5 mg Oral QHS  . pantoprazole  40 mg Oral Daily  . potassium chloride SA  20 mEq Oral Daily  . pregabalin  100 mg Oral TID WC  . pregabalin  200 mg Oral QHS  . sodium chloride  10-40 mL Intracatheter Q12H  . vancomycin  750 mg Intravenous Q12H   Continuous Infusions: . sodium chloride 75 mL/hr at 02/27/15 0833  . heparin 1,500 Units/hr (02/27/15 0556)    Active Problems:   Osteomyelitis   Anemia   DKA (diabetic ketoacidoses)   Infected wound   Bilateral pulmonary embolism   Palliative care encounter   Pulmonary embolus   Insomnia   Sacral pain    Time spent: 35 minutes   Barton Dubois  [facility] Pager [phone]. If 7PM-7AM, please contact night-coverage at [email or web address], password [facility] 02/27/2015, 3:03 PM  LOS: 9 days

## 2015-02-27 NOTE — Plan of Care (Signed)
Problem: Phase II Progression Outcomes Goal: Obtain order to discontinue catheter if appropriate Outcome: Not Applicable Date Met:  47/84/12 Chronic catheter

## 2015-02-27 NOTE — Progress Notes (Signed)
PHARMACY NOTE  Pharmacy Consult :  29 y.o. male is currently on Heparin infusion for pulmonary embolism .   Heparin Dosing Wt :  50.8 kg  LABS :  Recent Labs  02/25/15 0452 02/26/15 0500 02/27/15 0642  HGB 9.4* 9.2* 8.4*  HCT 29.4* 28.0* 26.4*  PLT 815* 721* 674*  HEPARINUNFRC 0.40 0.42 0.35  CREATININE 0.49*  --   --     MEDICATION: Infusion[s]: Infusions:  . sodium chloride 75 mL/hr at 02/27/15 0833  . heparin 1,500 Units/hr (02/27/15 0556)   Antibiotic[s]: Current Anti-infectives    Start     Dose/Rate Route Frequency Ordered Stop   02/23/15 1350  vancomycin (VANCOCIN) IVPB 750 mg/150 ml premix     750 mg 150 mL/hr over 60 Minutes Intravenous Every 12 hours 02/23/15 1351     02/20/15 1400  aztreonam (AZACTAM) 2 g in dextrose 5 % 50 mL IVPB     2 g 100 mL/hr over 30 Minutes Intravenous 3 times per day 02/19/15 1417     02/20/15 0800  CmetroNIDAZOLE (FLAGYL) IVPB 500 mg     500 mg 100 mL/hr over 60 Minutes Intravenous Every 8 hours 02/19/15 1417        ASSESSMENT :  29 y.o. male continuing on Heparin for pulmonary embolism    Heparin infusing at 1500 units/hr,  Heparin level remains within therapeutic window,  0.35 units/ml.  No evidence of bleeding complications observed.  GOAL :  Heparin Level  0.3 - 0.7 units/ml  PLAN : 1. Continue Heparin at the same rate, 1500 units/hr. 2. Daily Heparin level, CBC while on Heparin.  Monitor for bleeding complications. Follow Platelet counts.  Marthenia Rolling,  Pharm.D   02/27/2015,  10:33 AM

## 2015-02-28 LAB — CBC
HCT: 26 % — ABNORMAL LOW (ref 39.0–52.0)
Hemoglobin: 8.3 g/dL — ABNORMAL LOW (ref 13.0–17.0)
MCH: 26 pg (ref 26.0–34.0)
MCHC: 31.9 g/dL (ref 30.0–36.0)
MCV: 81.5 fL (ref 78.0–100.0)
Platelets: 657 10*3/uL — ABNORMAL HIGH (ref 150–400)
RBC: 3.19 MIL/uL — ABNORMAL LOW (ref 4.22–5.81)
RDW: 16.9 % — ABNORMAL HIGH (ref 11.5–15.5)
WBC: 14.4 10*3/uL — ABNORMAL HIGH (ref 4.0–10.5)

## 2015-02-28 LAB — BASIC METABOLIC PANEL
ANION GAP: 3 — AB (ref 5–15)
BUN: 10 mg/dL (ref 6–20)
CALCIUM: 8.3 mg/dL — AB (ref 8.9–10.3)
CO2: 20 mmol/L — ABNORMAL LOW (ref 22–32)
Chloride: 111 mmol/L (ref 101–111)
Creatinine, Ser: 0.52 mg/dL — ABNORMAL LOW (ref 0.61–1.24)
GFR calc non Af Amer: >60 mL/min (ref >60)
Glucose, Bld: 171 mg/dL — ABNORMAL HIGH (ref 65–99)
Potassium: 3.9 mmol/L (ref 3.5–5.1)
SODIUM: 134 mmol/L — AB (ref 135–145)

## 2015-02-28 LAB — GLUCOSE, CAPILLARY
GLUCOSE-CAPILLARY: 145 mg/dL — AB (ref 65–99)
Glucose-Capillary: 160 mg/dL — ABNORMAL HIGH (ref 65–99)
Glucose-Capillary: 164 mg/dL — ABNORMAL HIGH (ref 65–99)
Glucose-Capillary: 178 mg/dL — ABNORMAL HIGH (ref 65–99)
Glucose-Capillary: 223 mg/dL — ABNORMAL HIGH (ref 65–99)

## 2015-02-28 LAB — HEPARIN LEVEL (UNFRACTIONATED): Heparin Unfractionated: 0.36 IU/mL (ref 0.30–0.70)

## 2015-02-28 MED ORDER — BACLOFEN 20 MG PO TABS
20.0000 mg | ORAL_TABLET | Freq: Three times a day (TID) | ORAL | Status: DC
  Administered 2015-02-28 – 2015-03-06 (×17): 20 mg via ORAL
  Filled 2015-02-28 (×19): qty 1

## 2015-02-28 NOTE — Progress Notes (Signed)
[facility] PROGRESS NOTE  Jason [name]:[id number] DOB: 17-Aug-1986 DOA: 02/18/2015 PCP: [name], MD  Assessment/Plan: Active Problems:   Osteomyelitis   Anemia   DKA (diabetic ketoacidoses)   Infected wound   Bilateral pulmonary embolism   Palliative care encounter   Pulmonary embolus   Insomnia   Sacral pain  Acute Hypoxemic respiratory failure -CT angiogram shows Acute pulmonary embolus involving bilateral lower lobes with extensive consolidation of the bilateral lower lobes as described. The consolidations are most likely impart due to pulmonary infarction but underlying pneumonia is not excluded. 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 Johnnye Sima on board and guiding [name] on antibiotic choice and length  -Last debridement. 12/08/14 by Dr. Migdalia Dk, she has been consulted during this admission and per discussion with her; plan is for hydrotherapy at this moment (started on 5/20); will follow clinical response and follow PT inputs. -Wound care consultation completed, see note from 5/16 -Continue Air mattress -Currently on Aztreonam, Vancomycin, metronidazole -day #10 as per ID rec's -low grade temp overnight   Chronic hypotension: Continue midodrine, dose increased during this admission to help maintaining BP. -will monitor VS  Hyponatremia, sodium has improved after sugar control and IVF's -patient was not eating much at home -advise to keep himself well hydrated and to maintain good PO intake  Severe Protein calorie malnutrition: will follow rec's from dietitian and continue feeding supplements    Chronic anemia: -Without evidence of acute GI bleeding. But, with high concerns of chronic bleeding from decubitus ulcer -Had 1 unit of PRBC on 3/8.  -Patient received 2 units on 5/14 -will follow Hgb trend -Transfuse for active bleeding or hemoglobin less than 7 -Hgb stable; 9.4  Uncontrolled DM 1: Mild DKA with metabolic acidosis on admission- -patient acidosis and mild DKA now resolved -A1C 8.1 -discussed with mother and patient regarding importance on sugar control and medication compliance -will monitor and continue adjusting hypoglycemic regimen as needed; CBG's much better -will continue SSI, lantus 25 units    Neurogenic bladder:  -Had Klebsiella and BURKHOLDERIA UTI on previous admissions; no dysuria -will monitor -currently on broad spectrum antibiotics  Spinal cord infarction and quadriplegia: -Patient awake alert and oriented 3. -complaining of some neuropathy and unrelieved pain (which he has hx of, especially with diabetes and spinal cord injury) -will continue Lyrica and baclofen; last one dose adjusted to 20mg  TID (as per home medication orders according to mother) -will adjust fentanyl patch, continue oxycodone for breakthrough and add tylenol to help with pain  Insomnia: will continue Remeron (helping with sleep and appetite)  S/p Diverting Colostomy and suprapubic catheter  Reactive thrombocytosis: on heparin drip.  Code Status: full Family Communication: discussed with mother at bedside Disposition Plan: Overall prognosis is extremely grim, plastic surgery consultation and final recommendations pending    Consultants:  Infectious disease  Palliative Care  Procedures:  None  Antibiotics: Aztreonam, vancomycin and flagyl  5/13>>  HPI/Subjective: Patient is AAOX3, reports good appetite and endorses that even still pain with on his back, new pain regimen is controlling pain better. Has tolerated well first 4 treatments of hydrotherapy. Afebrile    Objective: Filed Vitals:   02/28/15 0424 02/28/15 0530 02/28/15 0802 02/28/15  1155  BP: 103/71  99/66 109/77  Pulse: 95  87 92  Temp: 100.2 F (37.9 C) 99 F (37.2 C) 98 F (36.7 C) 98.1 F (36.7 C)  TempSrc: Oral Oral Oral Oral  Resp: 18  16 17   Height:      Weight:      SpO2: 100%  100% 100%    Intake/Output Summary (Last 24 hours) at 02/28/15 1722 Last data filed at 02/28/15 1300  Gross per 24 hour  Intake 2617.5 ml  Output   5550 ml  Net -2932.5 ml    Exam: Gen: Still Complaining of pain in his back (but endorses that new regimen is controlling pain better); better appetite and drinking all feeding supplements. Patient reports he is also sleeping better. No fever Lungs: Clear to auscultation bilaterally without wheezes or crackles; good air movement Cardiovascular: Regular rate and rhythm without murmur gallop or rub normal S1 and S2 Abdomen: Nontender, nondistended, soft, bowel sounds positive, no rebound, no ascites, no appreciable mass Extremities: No significant cyanosis, clubbing, or edema bilateral lower extremities Neurological:alert to place and person; + quadriplegia  Skin: Multiple deep wounds to right buttock and perineal region. Large pressure wound to mid back. Wounds w mild, malodorous, yellowish-green drainage. No surrounding cellulitis appreciated. No fluctuance/abscess noted. No crepitus. Some necrotic tissue seen.   Data Reviewed: Basic Metabolic Panel:  Recent Labs Lab 02/22/15 0415 02/23/15 0526 02/25/15 0452 02/28/15 0625  NA 134* 134* 133* 134*  K 3.9 3.6 3.5 3.9  CL 111 111 110 111  CO2 18* 18* 20* 20*  GLUCOSE 187* 265* 281* 171*  BUN 6 6 7 10   CREATININE 0.46* 0.53* 0.49* 0.52*  CALCIUM 9.0 8.7* 8.6* 8.3*  MG  --  1.3*  --   --     Liver Function Tests:  Recent Labs Lab 02/22/15 0415 02/23/15 0526  AST 9* 8*  ALT 6* 7*  ALKPHOS 101 98  BILITOT 0.3 0.3  PROT 5.6* 5.5*  ALBUMIN 1.4* 1.4*   CBC:  Recent Labs Lab  02/24/15 0639 02/25/15 0452 02/26/15 0500 02/27/15 0642 02/28/15 0625  WBC 16.1* 18.2* 15.5* 16.0* 14.4*  HGB 9.4* 9.4* 9.2* 8.4* 8.3*  HCT 29.1* 29.4* 28.0* 26.4* 26.0*  MCV 80.6 81.0 81.6 81.2 81.5  PLT 788* 815* 721* 674* 657*   CBG:  Recent Labs Lab 02/27/15 2014 02/28/15 0010 02/28/15 0422 02/28/15 0800 02/28/15 1154  GLUCAP 180* 164* 178* 145* 160*    Recent Results (from the past 240 hour(s))  MRSA PCR Screening     Status: None   Collection Time: 02/18/15  8:33 PM  Result Value Ref Range Status   MRSA by PCR NEGATIVE NEGATIVE Final    Comment:        The GeneXpert MRSA Assay (FDA approved for NASAL specimens only), is one component of a comprehensive MRSA colonization surveillance program. It is not intended to diagnose MRSA infection nor to guide or monitor treatment for MRSA infections.   Wound culture     Status: None   Collection Time: 02/21/15  6:32 AM  Result Value Ref Range Status   Specimen Description WOUND SACRAL  Final   Special Requests Normal  Final   Gram Stain   Final    NO WBC SEEN NO SQUAMOUS EPITHELIAL CELLS SEEN FEW GRAM VARIABLE ROD Performed at [facility]    Culture   Final    MULTIPLE ORGANISMS PRESENT, NONE PREDOMINANT Note: NO STAPHYLOCOCCUS AUREUS ISOLATED NO GROUP A  STREP (S.PYOGENES) ISOLATED Performed at [facility]    Report Status 02/24/2015 FINAL  Final     Studies: Ct Angio Chest Pe W/cm &/or Wo Cm  02/20/2015   CLINICAL DATA:  Acute respiratory failure, hypoxia, shortness of breath, chest pain.  EXAM: CT ANGIOGRAPHY CHEST WITH CONTRAST  TECHNIQUE: Multidetector CT imaging of the chest was performed using the standard protocol during bolus administration of intravenous contrast. Multiplanar CT image reconstructions and MIPs were obtained to evaluate the vascular anatomy.  CONTRAST:  82mL OMNIPAQUE IOHEXOL 350 MG/ML SOLN  COMPARISON:  January 11, 2015  FINDINGS: There is acute pulmonary embolus in  segmental and subsegmental pulmonary arteries of the left lower lobe. There is acute pulmonary embolus in subsegmental pulmonary arteries of the right lower lobe. There are extensive consolidation of the right lower lobe and the medial aspect of the left lower lobe. There are small bilateral pleural effusions. There are lymphadenopathy in the mediastinum. The aorta is normal. There is small pericardial effusion. The visualized upper abdominal structures are normal.  Review of the MIP images confirms the above findings.  IMPRESSION: Acute pulmonary embolus involving bilateral lower lobes with extensive consolidation of the bilateral lower lobes as described. The consolidations are most likely impart due to pulmonary infarction but underlying pneumonia is not excluded.  Critical Value/emergent results were called by telephone at the time of interpretation on 02/20/2015 at 8:33 am to patient's nurse Clarisa Fling who verbally acknowledged these results.   Electronically Signed   By: Abelardo Diesel M.D.   On: 02/20/2015 08:40   Mr Lumbar Spine Wo Contrast  02/19/2015   CLINICAL DATA:  Infected sacral decubitus ulcers. Fever. Leukocytosis.  EXAM: MRI LUMBAR SPINE WITHOUT CONTRAST, MRI OF THE SACRUM WITH AND WITHOUT CONTRAST  TECHNIQUE: Multiplanar, multisequence MR imaging of the lumbar spine and sacrum was performed. Contrast was performed for the sacral imaging.  CONTRAST:  10 cc MultiHance  COMPARISON:  CT scan of the abdomen and pelvis dated 01/11/2015 and MRI of the pelvis dated 01/08/2015  FINDINGS: MRI of the lumbar spine:  Normal conus tip at L1-2. There is diffuse edema in the posterior paraspinal musculature and in the gluteal muscles. There is also atrophy of the paraspinal musculature. The patient is quadriplegic. The paraspinal soft tissues are otherwise normal.  The discs from T11-12 through L5-S1 are normal. There is low signal throughout the bones of the lumbar spine which may represent a red marrow  reactivation. There is no evidence of spinal infection. No spinal or foraminal stenosis or facet arthritis.  MRI of the sacrum:  There is abnormal edema and enhancement of the right ischial tuberosity. There is a deep decubitus ulcer that extends to the ischial tuberosity and there is a small abscess extending to enhance cm laterally from the decubitus ulcer at the ischial tuberosity. This pus collection does communicate with the decubitus ulcer.  There is devitalized soft tissue superficial to the left ischial tuberosity at the site of another decubitus ulcer. There is also devitalized soft tissue at the site of the soft tissue ulcer overlying the sacrum. There is slight edema and enhancement of the remnant of the fifth sacral segment and of the entire fourth thick sacral segment but there is no bone destruction.  No evidence of epidural infection in the sacrum.  There is abnormal subcutaneous edema throughout the pelvis with muscle atrophy.  IMPRESSION: 1. Multiple sacral and ischial tuberosity decubitus ulcers. Pus extends from the depths of the right ischial  tuberosity ulcer 2.5 cm laterally. 2. Abnormal edema and enhancement of the right ischial tuberosity with loss of the cortical margin, consistent with osteomyelitis. 3. Abnormal edema and enhancement of the fourth sacral segment and of the remnant of the fifth sacral segment. This is immediately adjacent to a sacral decubitus ulcer. This is not definitive for osteomyelitis however.   Electronically Signed   By: Lorriane Shire M.D.   On: 02/19/2015 13:16   Mr Sacrum/si Joints W Wo Contrast  02/19/2015   CLINICAL DATA:  Infected sacral decubitus ulcers. Fever. Leukocytosis.  EXAM: MRI LUMBAR SPINE WITHOUT CONTRAST, MRI OF THE SACRUM WITH AND WITHOUT CONTRAST  TECHNIQUE: Multiplanar, multisequence MR imaging of the lumbar spine and sacrum was performed. Contrast was performed for the sacral imaging.  CONTRAST:  10 cc MultiHance  COMPARISON:  CT scan of the  abdomen and pelvis dated 01/11/2015 and MRI of the pelvis dated 01/08/2015  FINDINGS: MRI of the lumbar spine:  Normal conus tip at L1-2. There is diffuse edema in the posterior paraspinal musculature and in the gluteal muscles. There is also atrophy of the paraspinal musculature. The patient is quadriplegic. The paraspinal soft tissues are otherwise normal.  The discs from T11-12 through L5-S1 are normal. There is low signal throughout the bones of the lumbar spine which may represent a red marrow reactivation. There is no evidence of spinal infection. No spinal or foraminal stenosis or facet arthritis.  MRI of the sacrum:  There is abnormal edema and enhancement of the right ischial tuberosity. There is a deep decubitus ulcer that extends to the ischial tuberosity and there is a small abscess extending to enhance cm laterally from the decubitus ulcer at the ischial tuberosity. This pus collection does communicate with the decubitus ulcer.  There is devitalized soft tissue superficial to the left ischial tuberosity at the site of another decubitus ulcer. There is also devitalized soft tissue at the site of the soft tissue ulcer overlying the sacrum. There is slight edema and enhancement of the remnant of the fifth sacral segment and of the entire fourth thick sacral segment but there is no bone destruction.  No evidence of epidural infection in the sacrum.  There is abnormal subcutaneous edema throughout the pelvis with muscle atrophy.  IMPRESSION: 1. Multiple sacral and ischial tuberosity decubitus ulcers. Pus extends from the depths of the right ischial tuberosity ulcer 2.5 cm laterally. 2. Abnormal edema and enhancement of the right ischial tuberosity with loss of the cortical margin, consistent with osteomyelitis. 3. Abnormal edema and enhancement of the fourth sacral segment and of the remnant of the fifth sacral segment. This is immediately adjacent to a sacral decubitus ulcer. This is not definitive for  osteomyelitis however.   Electronically Signed   By: Lorriane Shire M.D.   On: 02/19/2015 13:16   Dg Chest Port 1 View  02/20/2015   CLINICAL DATA:  Patient woke up this morning with chest pain and shortness of breath.  EXAM: PORTABLE CHEST - 1 VIEW  COMPARISON:  CT chest 01/11/2015.  Chest 01/10/2015.  FINDINGS: Borderline heart size with normal pulmonary vascularity. Patchy bilateral basilar airspace disease is improving since previous study. Small bilateral pleural effusions. Right PICC catheter tip is over the cavoatrial junction. No pneumothorax. Old fracture deformity of the right clavicle.  IMPRESSION: Basilar airspace infiltrates are improving since previous study. Small bilateral pleural effusions.   Electronically Signed   By: Lucienne Capers M.D.   On: 02/20/2015 05:42    Scheduled  Meds: . aztreonam  2 g Intravenous 3 times per day  . baclofen  20 mg Oral TID  . collagenase   Topical Daily  . feeding supplement (GLUCERNA SHAKE)  237 mL Oral TID BM  . fentaNYL  50 mcg Transdermal Q72H  . insulin aspart  0-9 Units Subcutaneous TID WC  . insulin glargine  25 Units Subcutaneous Daily  . loratadine  10 mg Oral Daily  . magnesium oxide  400 mg Oral Daily  . metoCLOPramide  5 mg Oral Daily  . metronidazole  500 mg Intravenous Q8H  . midodrine  10 mg Oral TID WC  . mirtazapine  7.5 mg Oral QHS  . pantoprazole  40 mg Oral Daily  . potassium chloride SA  20 mEq Oral Daily  . pregabalin  100 mg Oral TID WC  . pregabalin  200 mg Oral QHS  . sodium chloride  10-40 mL Intracatheter Q12H  . vancomycin  750 mg Intravenous Q12H   Continuous Infusions: . sodium chloride 75 mL/hr at 02/27/15 0833  . heparin 1,500 Units/hr (02/28/15 1655)    Active Problems:   Osteomyelitis   Anemia   DKA (diabetic ketoacidoses)   Infected wound   Bilateral pulmonary embolism   Palliative care encounter   Pulmonary embolus   Insomnia   Sacral pain    Time spent: 35 minutes   Barton Dubois  [facility] Pager [phone]. If 7PM-7AM, please contact night-coverage at [email or web address], password [facility] 02/28/2015, 5:22 PM  LOS: 10 days

## 2015-02-28 NOTE — Clinical Social Work Note (Signed)
CSW continues to follow patient for ambulance transport home. Please notify CSW when patient is ready for DC.  Liz Beach MSW, [place], [place], [phone]

## 2015-02-28 NOTE — Progress Notes (Signed)
Physical Therapy Wound Treatment Patient Details  Name: Jason [name] MRN: [id number] Date of Birth: 26-Jan-1986  Today's Date: 02/28/2015 Time: 4098-1191 Time Calculation (min): 36 min  Subjective  Subjective: Asking appropriate questions about his wounds. Patient and Family Stated Goals: Wounds to heal.  Date of Onset:  (pt unsure exact onset, but states "months".) Prior Treatments: I+D by plastics and dressing changes with Santyl.    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 (Active)  Dressing Type ABD;Barrier Film (skin prep);Gauze (Comment);Tape dressing;Moist to dry 02/28/2015 10:47 AM  Dressing Changed 02/28/2015 10:47 AM  Dressing Change Frequency Twice a day 02/28/2015 10:47 AM  State of Healing Early/partial granulation 02/28/2015 10:47 AM  Site / Wound Assessment Bleeding;Yellow;Pink;Red 02/28/2015 10:47 AM  % Wound base Red or Granulating 60% 02/28/2015 10:47 AM  % Wound base Yellow 40% 02/28/2015 10:47 AM  % Wound base Black 0% 02/28/2015 10:47 AM  % Wound base Other (Comment) 0% 02/28/2015 10:47 AM  Peri-wound Assessment Intact 02/28/2015 10:47 AM  Wound Length (cm) 9.9 cm 02/25/2015  2:00 PM  Wound Width (cm) 8.6 cm 02/25/2015  2:00 PM  Wound Depth (cm) 2.9 cm 02/25/2015  2:00 PM  Undermining (cm) Undermines from 10:00 to 3:00 and deepest portion is 2.7cm. 02/25/2015  2:00 PM  Margins Unattached edges (unapproximated) 02/28/2015 10:47 AM  Drainage Amount Copious 02/28/2015 10:47 AM  Drainage Description Serosanguineous;Odor 02/28/2015 10:47 AM  Treatment Debridement (Selective);Hydrotherapy (Pulse lavage);Packing (Saline gauze) 02/28/2015 10:47 AM   Santyl applied to wound bed prior to applying dressing.      Pressure Ulcer 02/20/15 Unstageable - Full thickness tissue loss in which the base of the ulcer is covered by slough (yellow, tan, gray, green or brown) and/or  eschar (tan, brown or black) in the wound bed. (Active)  Dressing Type Barrier Film (skin prep);Gauze (Comment);Moist to dry;Tape dressing;ABD 02/28/2015 10:47 AM  Dressing Changed 02/28/2015 10:47 AM  Dressing Change Frequency Daily 02/28/2015 10:47 AM  State of Healing Eschar 02/28/2015 10:47 AM  Site / Wound Assessment Brown;Pink;Yellow;Black 02/28/2015 10:47 AM  % Wound base Red or Granulating 5% 02/28/2015 10:47 AM  % Wound base Yellow 5% 02/28/2015 10:47 AM  % Wound base Black 90% 02/28/2015 10:47 AM  Peri-wound Assessment Intact 02/28/2015 10:47 AM  Wound Length (cm) 6.9 cm 02/25/2015  2:00 PM  Wound Width (cm) 6.6 cm 02/25/2015  2:00 PM  Wound Depth (cm) 3.2 cm 02/25/2015  2:00 PM  Undermining (cm) Undermines 12:00 to 4:00 with deepest portion 3.9cm.   02/25/2015  2:00 PM  Margins Unattached edges (unapproximated) 02/28/2015 10:47 AM  Drainage Amount Minimal 02/28/2015 10:47 AM  Drainage Description Odor 02/28/2015 10:47 AM  Treatment Debridement (Selective);Packing (Saline gauze);Hydrotherapy (Pulse lavage) 02/28/2015 10:47 AM   Santyl applied to wound bed prior to applying dressing.   Wound / Incision (Open or Dehisced) 12/19/14 Scrotum small open area (Active)     Wound / Incision (Open or Dehisced) 02/25/15 Incision - Open Ischial tuberosity Right Right Ischium (Active)  Dressing Type Barrier Film (skin prep);Gauze (Comment);Moist to dry;Tape dressing;ABD 02/28/2015 10:47 AM  Dressing Changed Changed 02/28/2015 10:47 AM  Dressing Status Clean;Dry;Intact 02/28/2015 10:47 AM  Dressing Change Frequency Daily 02/28/2015 10:47 AM  Site / Wound Assessment Bleeding;Pink;Red;Yellow 02/28/2015 10:47 AM  % Wound base Red or Granulating 70% 02/28/2015 10:47 AM  % Wound base Yellow 30% 02/28/2015 10:47 AM  Peri-wound Assessment  Intact 02/28/2015 10:47 AM  Margins Unattached edges (unapproximated) 02/28/2015 10:47 AM  Closure None 02/28/2015 10:47 AM  Drainage Amount Moderate 02/28/2015 10:47 AM  Drainage  Description Serosanguineous;Odor 02/28/2015 10:47 AM  Treatment Debridement (Selective);Hydrotherapy (Pulse lavage);Packing (Saline gauze) 02/28/2015 10:47 AM   Santyl applied to wound bed prior to applying dressing.    Hydrotherapy Pulsed lavage therapy - wound location: Sacrum and Bil Ischial Pulsed Lavage with Suction (psi): 8 psi Pulsed Lavage with Suction - Normal Saline Used: 1000 mL Pulsed Lavage Tip: Tip with splash shield Selective Debridement Selective Debridement - Location: Sacral and bil ischial tuberosity wounds Selective Debridement - Tools Used: Forceps;Scissors Selective Debridement - Tissue Removed: Yellow slough; brown eschar   Wound Assessment and Plan  Wound Therapy - Assess/Plan/Recommendations Wound Therapy - Clinical Statement: Steady progress in wounds. Eschar on lt ischium remains tight. Wound Therapy - Functional Problem List: Multiple wounds and pressure sores. Factors Delaying/Impairing Wound Healing: Altered sensation;Immobility;Polypharmacy Hydrotherapy Plan: Debridement;Dressing change;Patient/family education;Pulsatile lavage with suction Wound Therapy - Frequency: 6X / week Wound Therapy - Current Recommendations: Other (comment) (Continue with Plastics as outpatient.) Wound Therapy - Follow Up Recommendations: Other (comment) (Per MD orders.) Wound Plan: See above.  Wound Therapy Goals- Improve the function of patient's integumentary system by progressing the wound(s) through the phases of wound healing (inflammation - proliferation - remodeling) by: Decrease Necrotic Tissue to: 30 Decrease Necrotic Tissue - Progress: Progressing toward goal Increase Granulation Tissue to: 70 Increase Granulation Tissue - Progress: Progressing toward goal Improve Drainage Characteristics: Min Improve Drainage Characteristics - Progress: Not progressing Patient/Family will be able to : pt/family to state and perform dressing changes.   Patient/Family Instruction Goal  - Progress: Progressing toward goal  Goals will be updated until maximal potential achieved or discharge criteria met.  Discharge criteria: when goals achieved, discharge from hospital, MD decision/surgical intervention, no progress towards goals, refusal/missing three consecutive treatments without notification or medical reason.  GP     Marinus Eicher 02/28/2015, 10:59 AM

## 2015-02-28 NOTE — Plan of Care (Signed)
Problem: Consults Goal: Diagnosis - Venous Thromboembolism (VTE) Choose a selection PE (Pulmonary Embolism)  Problem: Phase I Progression Outcomes Goal: Voiding-avoid urinary catheter unless indicated Outcome: Not Met (add Reason) Patient has chronic suprapubic catheter.

## 2015-02-28 NOTE — Progress Notes (Signed)
PHARMACY NOTE  Pharmacy Consult :  29 y.o. male is currently on Heparin infusion for pulmonary embolism .   Heparin Dosing Wt :  50.8 kg  LABS :  Recent Labs  02/26/15 0500 02/27/15 0642 02/28/15 0500 02/28/15 0625  HGB 9.2* 8.4*  --  8.3*  HCT 28.0* 26.4*  --  26.0*  PLT 721* 674*  --  657*  HEPARINUNFRC 0.42 0.35 0.36  --   CREATININE  --   --   --  0.52*    MEDICATION: Infusion[s]: Infusions:  . sodium chloride 75 mL/hr at 02/27/15 0833  . heparin 1,500 Units/hr (02/27/15 2253)   Antibiotic[s]: Current Anti-infectives    Start     Dose/Rate Route Frequency Ordered Stop   02/23/15 1350  vancomycin (VANCOCIN) IVPB 750 mg/150 ml premix     750 mg 150 mL/hr over 60 Minutes Intravenous Every 12 hours 02/23/15 1351     02/20/15 1400  aztreonam (AZACTAM) 2 g in dextrose 5 % 50 mL IVPB     2 g 100 mL/hr over 30 Minutes Intravenous 3 times per day 02/19/15 1417     02/20/15 0800  CmetroNIDAZOLE (FLAGYL) IVPB 500 mg     500 mg 100 mL/hr over 60 Minutes Intravenous Every 8 hours 02/19/15 1417        ASSESSMENT :  29 y.o. male continuing on Heparin for pulmonary embolism    Heparin infusing at 1500 units/hr,  Heparin level remains within therapeutic windowNo evidence of bleeding complications observed.  Continues on Vancomycin, aztreonam, and flagyl for infected wounds, Next vancomycin trough due 5/27  GOAL :  Heparin Level  0.3 - 0.7 units/ml  PLAN : 1. Continue Heparin at the same rate, 1500 units/hr. 2. Daily Heparin level, CBC while on Heparin.  Monitor for bleeding complications. Follow Platelet counts. 3. Continue antibiotics at current doses  Thank you. Anette Guarneri, PharmD [phone]   02/28/2015,  11:29 AM

## 2015-03-01 DIAGNOSIS — E1011 Type 1 diabetes mellitus with ketoacidosis with coma: Secondary | ICD-10-CM

## 2015-03-01 DIAGNOSIS — Z0279 Encounter for issue of other medical certificate: Secondary | ICD-10-CM

## 2015-03-01 DIAGNOSIS — D500 Iron deficiency anemia secondary to blood loss (chronic): Secondary | ICD-10-CM

## 2015-03-01 LAB — GLUCOSE, CAPILLARY
GLUCOSE-CAPILLARY: 185 mg/dL — AB (ref 65–99)
GLUCOSE-CAPILLARY: 234 mg/dL — AB (ref 65–99)
GLUCOSE-CAPILLARY: 292 mg/dL — AB (ref 65–99)
Glucose-Capillary: 241 mg/dL — ABNORMAL HIGH (ref 65–99)
Glucose-Capillary: 249 mg/dL — ABNORMAL HIGH (ref 65–99)
Glucose-Capillary: 289 mg/dL — ABNORMAL HIGH (ref 65–99)
Glucose-Capillary: 433 mg/dL — ABNORMAL HIGH (ref 65–99)

## 2015-03-01 LAB — CBC
HEMATOCRIT: 26.5 % — AB (ref 39.0–52.0)
Hemoglobin: 8.6 g/dL — ABNORMAL LOW (ref 13.0–17.0)
MCH: 26.5 pg (ref 26.0–34.0)
MCHC: 32.5 g/dL (ref 30.0–36.0)
MCV: 81.8 fL (ref 78.0–100.0)
Platelets: 639 10*3/uL — ABNORMAL HIGH (ref 150–400)
RBC: 3.24 MIL/uL — AB (ref 4.22–5.81)
RDW: 17.4 % — AB (ref 11.5–15.5)
WBC: 14.2 10*3/uL — ABNORMAL HIGH (ref 4.0–10.5)

## 2015-03-01 LAB — HEPARIN LEVEL (UNFRACTIONATED): HEPARIN UNFRACTIONATED: 0.33 [IU]/mL (ref 0.30–0.70)

## 2015-03-01 MED ORDER — METRONIDAZOLE 500 MG PO TABS
500.0000 mg | ORAL_TABLET | Freq: Three times a day (TID) | ORAL | Status: DC
  Administered 2015-03-01 – 2015-03-06 (×15): 500 mg via ORAL
  Filled 2015-03-01 (×18): qty 1

## 2015-03-01 NOTE — Progress Notes (Signed)
Physical Therapy Wound Treatment Patient Details  Name: Jason [name] MRN: [id number] Date of Birth: 01-02-1986  Today's Date: 03/01/2015 Time: 7654-6503 Time Calculation (min): 38 min  Subjective  Subjective: No c/o's Patient and Family Stated Goals: Wounds to heal.  Date of Onset:  (pt unsure exact onset, but states "months".) Prior Treatments: I+D by plastics and dressing changes with Santyl.    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 (Active)  Dressing Type ABD;Barrier Film (skin prep);Gauze (Comment);Tape dressing;Moist to dry 03/01/2015 10:37 AM  Dressing Changed 03/01/2015 10:37 AM  Dressing Change Frequency Twice a day 03/01/2015 10:37 AM  State of Healing Early/partial granulation 03/01/2015 10:37 AM  Site / Wound Assessment Yellow;Pink;Red 03/01/2015 10:37 AM  % Wound base Red or Granulating 65% 03/01/2015 10:37 AM  % Wound base Yellow 35% 03/01/2015 10:37 AM  % Wound base Black 0% 03/01/2015 10:37 AM  % Wound base Other (Comment) 0% 03/01/2015 10:37 AM  Peri-wound Assessment Intact 03/01/2015 10:37 AM  Wound Length (cm) 9.9 cm 02/25/2015  2:00 PM  Wound Width (cm) 8.6 cm 02/25/2015  2:00 PM  Wound Depth (cm) 2.9 cm 02/25/2015  2:00 PM  Undermining (cm) Undermines from 10:00 to 3:00 and deepest portion is 2.7cm. 02/25/2015  2:00 PM  Margins Unattached edges (unapproximated) 03/01/2015 10:37 AM  Drainage Amount Moderate 03/01/2015 10:37 AM  Drainage Description Serosanguineous 03/01/2015 10:37 AM  Treatment Debridement (Selective);Hydrotherapy (Pulse lavage);Packing (Saline gauze) 03/01/2015 10:37 AM   Santyl applied to wound bed prior to applying dressing.      Pressure Ulcer 02/20/15 Unstageable - Full thickness tissue loss in which the base of the ulcer is covered by slough (yellow, tan, gray, green or brown) and/or eschar (tan, brown or black) in the wound bed.  (Active)  Dressing Type Barrier Film (skin prep);Gauze (Comment);Moist to dry;Tape dressing;ABD 03/01/2015 10:37 AM  Dressing Changed 03/01/2015 10:37 AM  Dressing Change Frequency Daily 03/01/2015 10:37 AM  State of Healing Eschar 03/01/2015 10:37 AM  Site / Wound Assessment Brown;Pink;Yellow;Black 03/01/2015 10:37 AM  % Wound base Red or Granulating 5% 03/01/2015 10:37 AM  % Wound base Yellow 5% 03/01/2015 10:37 AM  % Wound base Black 90% 03/01/2015 10:37 AM  Peri-wound Assessment Intact 03/01/2015 10:37 AM  Wound Length (cm) 6.9 cm 02/25/2015  2:00 PM  Wound Width (cm) 6.6 cm 02/25/2015  2:00 PM  Wound Depth (cm) 3.2 cm 02/25/2015  2:00 PM  Undermining (cm) Undermines 12:00 to 4:00 with deepest portion 3.9cm.   02/25/2015  2:00 PM  Margins Unattached edges (unapproximated) 03/01/2015 10:37 AM  Drainage Amount Minimal 03/01/2015 10:37 AM  Drainage Description Odor 03/01/2015 10:37 AM  Treatment Debridement (Selective);Hydrotherapy (Pulse lavage);Packing (Saline gauze) 03/01/2015 10:37 AM   Santyl applied to wound bed prior to applying dressing.   Wound / Incision (Open or Dehisced) 02/25/15 Incision - Open Ischial tuberosity Right Right Ischium (Active)  Dressing Type Barrier Film (skin prep);Gauze (Comment);Moist to dry;Tape dressing;ABD 03/01/2015 10:37 AM  Dressing Changed Changed 03/01/2015 10:37 AM  Dressing Status Clean;Dry;Intact 03/01/2015 10:37 AM  Dressing Change Frequency Daily 03/01/2015 10:37 AM  Site / Wound Assessment Pink;Red;Yellow 03/01/2015 10:37 AM  % Wound base Red or Granulating 75% 03/01/2015 10:37 AM  % Wound base Yellow 25% 03/01/2015 10:37 AM  % Wound base Black 0% 03/01/2015 10:37 AM  % Wound base Other (Comment) 0% 03/01/2015 10:37 AM  Peri-wound Assessment Intact  03/01/2015 10:37 AM  Wound Length (cm) 6 cm 03/01/2015 10:37 AM  Wound Width (cm) 8 cm 03/01/2015 10:37 AM  Wound Depth (cm) 3 cm 03/01/2015 10:37 AM  Tunneling (cm) 4 cm at 12 o'clock 03/01/2015 10:37 AM  Undermining (cm)  2-3cm at 9-11 o'clock 03/01/2015 10:37 AM  Margins Unattached edges (unapproximated) 03/01/2015 10:37 AM  Closure None 03/01/2015 10:37 AM  Drainage Amount Moderate 03/01/2015 10:37 AM  Drainage Description Serosanguineous;Odor 03/01/2015 10:37 AM  Treatment Debridement (Selective);Hydrotherapy (Pulse lavage);Packing (Saline gauze) 03/01/2015 10:37 AM   Santyl applied to wound bed prior to applying dressing.    Hydrotherapy Pulsed lavage therapy - wound location: Sacrum and Bil Ischial Pulsed Lavage with Suction (psi): 8 psi Pulsed Lavage with Suction - Normal Saline Used: 1000 mL Pulsed Lavage Tip: Tip with splash shield Selective Debridement Selective Debridement - Location: Sacral and bil ischial tuberosity wounds Selective Debridement - Tools Used: Forceps;Scissors Selective Debridement - Tissue Removed: Yellow slough; brown eschar   Wound Assessment and Plan  Wound Therapy - Assess/Plan/Recommendations Wound Therapy - Clinical Statement: Steady progress in wounds. Eschar on lt ischium beginning to loosen. Wound Therapy - Functional Problem List: Multiple wounds and pressure sores. Factors Delaying/Impairing Wound Healing: Altered sensation;Immobility;Polypharmacy Hydrotherapy Plan: Debridement;Dressing change;Patient/family education;Pulsatile lavage with suction Wound Therapy - Frequency: 6X / week Wound Therapy - Current Recommendations: Other (comment) (Continue with Plastics as outpatient.) Wound Therapy - Follow Up Recommendations: Other (comment) (Per MD orders.) Wound Plan: See above.  Wound Therapy Goals- Improve the function of patient's integumentary system by progressing the wound(s) through the phases of wound healing (inflammation - proliferation - remodeling) by: Decrease Necrotic Tissue to: 30 Decrease Necrotic Tissue - Progress: Progressing toward goal Increase Granulation Tissue to: 70 Increase Granulation Tissue - Progress: Progressing toward goal Improve Drainage  Characteristics: Min Improve Drainage Characteristics - Progress: Progressing toward goal Patient/Family will be able to : pt/family to state and perform dressing changes.   Patient/Family Instruction Goal - Progress: Progressing toward goal  Goals will be updated until maximal potential achieved or discharge criteria met.  Discharge criteria: when goals achieved, discharge from hospital, MD decision/surgical intervention, no progress towards goals, refusal/missing three consecutive treatments without notification or medical reason.  GP     Alisi Lupien 03/01/2015, 10:45 AM  Suanne Marker PT [phone]

## 2015-03-01 NOTE — Evaluation (Signed)
Physical Therapy Evaluation Patient Details Name: Jason [name] MRN: [id number] DOB: 1986-07-23 Today's Date: 03/01/2015   History of Present Illness  is a 29 y.o. male with hx of asthma, MRSA infection, seizures, spinal stroke in 4/15, PNA, UTI, ischial osteomyelitis, diabetes mellitus, GERD, who presents with sacral decubitus infection, and anemia, and mild dka   Clinical Impression  Pt admitted with above diagnosis. Pt currently with functional limitations due to the deficits listed below (see PT Problem List). Pt has had functional decline since hospitalizations last December. Worked on long sitting balance, BUE strengthening, LE PROM, trunk stability today. Pt will benefit from skilled PT to increase their independence and safety with mobility to allow discharge to the venue listed below.       Follow Up Recommendations Home health PT (?CIR candidate?)    Equipment Recommendations  None recommended by PT    Recommendations for Other Services Rehab consult     Precautions / Restrictions Precautions Precaution Comments: ischeal and sacral decubitii,  Restrictions Weight Bearing Restrictions: No      Mobility  Bed Mobility Overal bed mobility: Needs Assistance Bed Mobility: Supine to Sit     Supine to sit: +2 for physical assistance;Mod assist     General bed mobility comments: pt used BUEs to pull up long sitting, pt sat for 20 minutes and performed lateral weight shifts, hamstring stretching, lumbar rotation stretching,transverse abdominal isometrics, neck AROM  Transfers                    Ambulation/Gait                Stairs            Wheelchair Mobility    Modified Rankin (Stroke Patients Only)       Balance Overall balance assessment: Needs assistance     Sitting balance - Comments: pt sat in long sitting on air mattress for 20 min with supervision to min A for balance                                      Pertinent Vitals/Pain Pain Assessment: 0-10 Pain Score: 9  Pain Location: ischeal wounds Pain Descriptors / Indicators: Sore Pain Intervention(s): Patient requesting pain meds-RN notified;Monitored during session    Home Living Family/patient expects to be discharged to:: Private residence Living Arrangements: Parent Available Help at Discharge: Family;Available 24 hours/day Type of Home: House Home Access: Level entry     Home Layout: One level Home Equipment: Bedside commode;Hospital bed;Wheelchair - power;Other (comment) Harrel Lemon)      Prior Function Level of Independence: Needs assistance   Gait / Transfers Assistance Needed: Per mom he has been physically declining since the wounds got worse. She at one point could consistantly slide board transfer him. Lately they've had to use [facility].  She reports he has become weaker compared to when he was [facility] (October was their last appointment)  ADL's / Homemaking Assistance Needed: pt's mom states pt can usually feed himself but has gotten weaker recently and she has had to help feed him since hospitalization in December, mother assists with ADLs. Pt issued a mug with handles and a universal cuff with dorsal support last hospitalization. Mother reports the cuff  opening started to stretch and the cuff would fall out. He has started using fork without universal cuff again. He uses mug  frequently at home.         Hand Dominance   Dominant Hand: Left    Extremity/Trunk Assessment   Upper Extremity Assessment: RUE deficits/detail;LUE deficits/detail RUE Deficits / Details: pt able to use both hands to grip around styrofoam cup using palms. Able to perform some shoulder flexion but limited due to pain reported in R shoulder. Pt has grossly 3+/5 strength through elbow with ability to pull on rail and bed cushions to try to reposition self.          Lower Extremity Assessment: LLE deficits/detail;RLE  deficits/detail RLE Deficits / Details: flaccid, sensation intact       Communication   Communication: No difficulties  Cognition Arousal/Alertness: Awake/alert Behavior During Therapy: WFL for tasks assessed/performed Overall Cognitive Status: Within Functional Limits for tasks assessed                      General Comments      Exercises General Exercises - Lower Extremity Ankle Circles/Pumps: PROM;Both;10 reps (instructed pt/mom in gastroc stretch using a sheet) Heel Slides: PROM;Both;10 reps;Supine Hip ABduction/ADduction: PROM;Both;10 reps;Supine Other Exercises Other Exercises: yellow theraband horizontal abduction X 8 ; min assist to maintain grip.      Assessment/Plan    PT Assessment Patient needs continued PT services  PT Diagnosis Generalized weakness;Quadraplegia   PT Problem List Decreased strength;Decreased activity tolerance;Decreased balance;Pain;Decreased mobility  PT Treatment Interventions Functional mobility training;Therapeutic activities;Patient/family education;Therapeutic exercise;Balance training   PT Goals (Current goals can be found in the Care Plan section) Acute Rehab PT Goals Patient Stated Goal: to be able to perform sliding board transfer, feed self with adaptive equipment, increase arm strength PT Goal Formulation: With patient/family Time For Goal Achievement: 03/15/15 Potential to Achieve Goals: Good    Frequency Min 3X/week   Barriers to discharge        Co-evaluation PT/OT/SLP Co-Evaluation/Treatment: Yes Reason for Co-Treatment: Complexity of the patient's impairments (multi-system involvement) PT goals addressed during session: Mobility/safety with mobility;Balance;Strengthening/ROM         End of Session   Activity Tolerance: Patient tolerated treatment well;No increased pain Patient left: in bed;with call bell/phone within reach;with family/visitor present Nurse Communication: Mobility status         Time:  5056-9794 PT Time Calculation (min) (ACUTE ONLY): 84 min   Charges:   PT Evaluation $Initial PT Evaluation Tier I: 1 Procedure PT Treatments $Therapeutic Exercise: 23-37 mins $Therapeutic Activity: 23-37 mins   PT G Codes:        Blondell Reveal Kistler 03/01/2015, 1:15 PM [phone]

## 2015-03-01 NOTE — Progress Notes (Signed)
Inpatient Diabetes Program Recommendations  AACE/ADA: New Consensus Statement on Inpatient Glycemic Control (2013)  Target Ranges:  Prepandial:   less than 140 mg/dL      Peak postprandial:   less than 180 mg/dL (1-2 hours)      Critically ill patients:  140 - 180 mg/dL   Results for LENARDO, WESTWOOD (MRN [id number]) as of 03/01/2015 11:10  Ref. Range 02/28/2015 08:00 02/28/2015 11:54 02/28/2015 20:28 03/01/2015 00:26 03/01/2015 04:02 03/01/2015 08:00  Glucose-Capillary Latest Ref Range: 65-99 mg/dL 145 (H) 160 (H) 223 (H) 292 (H) 234 (H) 185 (H)   Diabetes history: DM 2 Outpatient Diabetes medications: Lantus 15 units QHS, Novolog 5 units TID Current orders for Inpatient glycemic control: Lantus 25 units Daily, Novolog 0-9 units TID  Inpatient Diabetes Program Recommendations Oral Agents: Patient has very good intake eating 75-100% of meals. Glucose increased into the 200's overnight patient had 3 glucerna's in the afternoon yesterday. CBG's should be ACHS anyway. Please discontinue the glucerna.  Thanks,  Tama Headings RN, MSN, [facility] Inpatient Diabetes Coordinator Team Pager [phone]

## 2015-03-01 NOTE — Evaluation (Addendum)
Occupational Therapy Evaluation Patient Details Name: Jason [name] MRN: [id number] DOB: 05/10/1986 Today's Date: 03/01/2015    History of Present Illness is a 29 y.o. male with hx of asthma, MRSA infection, seizures, spinal stroke in 4/15, PNA, UTI, ischial osteomyelitis, diabetes mellitus, GERD, who presents with sacral decubitus infection, and anemia, and mild dka    Clinical Impression   Pt is motivated and wants to work toward being able to hopefully do a slide board transfer again one day. Per pt and mother, pt has had a decline over the last 6 months or so with frequent hospitalizations. Pt would benefit from acute OT to progress UE strength for ADL/ prep for functional transfers as well as continue with AE education/strategies for self feeding. Will follow.    Follow Up Recommendations  Home health OT;Supervision/Assistance - 24 hour    Equipment Recommendations  None recommended by OT    Recommendations for Other Services       Precautions / Restrictions Precautions Precaution Comments: ischeal and sacral decubitii,  Restrictions Weight Bearing Restrictions: No      Mobility Bed Mobility Overal bed mobility: Needs Assistance Bed Mobility: Supine to Sit     Supine to sit: +2 for physical assistance;Mod assist     General bed mobility comments: pt used BUEs to pull up long sitting, pt sat for 20 minutes and performed lateral weight shifts, hamstring stretching, lumbar rotation stretching,transverse abdominal isometrics, neck AROM  Transfers                      Balance Overall balance assessment: Needs assistance     Sitting balance - Comments: pt sat in long sitting on air mattress for 20 min with supervision to min A for balance                                    ADL Overall ADL's : Needs assistance/impaired                                       General ADL Comments: Pt and mother report that pt used universal  cuff (with dorsal support) issued last hospital stay for awhile but then the opening stretched and utensil would fall out. She attempted to sew opening tighter but then pt just started using utensil between his digits to poke the food onto his fork. Will determine if pt would like another universal cuff or any other feeding strategies that may be beneficial during visit. Issued yellow theraband and performed horizontal abduction exercises to work on posture and trying to work on scapular retraction. Will benefit from additional exercises being added including triceps/biceps, shoulder flexion. Tied knots  in band and wrapped around hand/between digits. Will benefit from a long theraband piece to help wrap around hands better. Worked on long sitting today in prep for working toward functional transfers again hopefully. Pt has been using hoyer lift for awhile due to progressive decline with frequent hospitalizations.      Vision     Perception     Praxis      Pertinent Vitals/Pain Pain Assessment: 0-10 Pain Score: 9  Pain Location: ischeal wounds Pain Descriptors / Indicators: Sore Pain Intervention(s): Patient requesting pain meds-RN notified;Monitored during session     Hand Dominance Left   Extremity/Trunk Assessment Upper Extremity  Assessment Upper Extremity Assessment: RUE deficits/detail;LUE deficits/detail RUE Deficits / Details: pt able to use both hands to grip around styrofoam cup using palms. Able to perform some shoulder flexion but limited due to pain reported in R shoulder. Pt has grossly 3+/5 strength through elbow with ability to pull on rail and bed cushions to try to reposition self.           Communication Communication Communication: No difficulties   Cognition Arousal/Alertness: Awake/alert Behavior During Therapy: WFL for tasks assessed/performed Overall Cognitive Status: Within Functional Limits for tasks assessed                     General Comments        Exercises   Other Exercises Other Exercises: yellow theraband horizontal abduction X 8 ; min assist to maintain grip.   Shoulder Instructions      Home Living Family/patient expects to be discharged to:: Private residence Living Arrangements: Parent Available Help at Discharge: Family;Available 24 hours/day Type of Home: House Home Access: Level entry     Home Layout: One level               Home Equipment: Bedside commode;Hospital bed;Wheelchair - power;Other (comment) Harrel Lemon)          Prior Functioning/Environment Level of Independence: Needs assistance  Gait / Transfers Assistance Needed: Per mom he has been physically declining since the wounds got worse. She at one point could consistantly slide board transfer him. Lately they've had to use [facility].  She reports he has become weaker compared to when he was working with [facility] PT at [facility] (October was their last appointment) ADL's / Homemaking Assistance Needed: pt's mom states pt can usually feed himself but has gotten weaker recently and she has had to help feed him since hospitalization in December, mother assists with ADLs. Pt issued a mug with handles and a universal cuff with dorsal support last hospitalization. Mother reports the cuff  opening started to stretch and the cuff would fall out. He has started using fork without universal cuff again. He uses mug frequently at home.         OT Diagnosis: Generalized weakness   OT Problem List: Decreased strength;Decreased knowledge of use of DME or AE   OT Treatment/Interventions: Self-care/ADL training;Patient/family education;Therapeutic activities;DME and/or AE instruction;Therapeutic exercise    OT Goals(Current goals can be found in the care plan section) Acute Rehab OT Goals Patient Stated Goal: to be able to perform sliding board transfer, feed self with adaptive equipment, increase arm strength OT Goal Formulation: With patient/family Time For Goal  Achievement: 03/15/15 Potential to Achieve Goals: Good  OT Frequency: Min 2X/week   Barriers to D/C:            Co-evaluation   Reason for Co-Treatment: Complexity of the patient's impairments (multi-system involvement) PT goals addressed during session: Mobility/safety with mobility;Balance;Strengthening/ROM        End of Session    Activity Tolerance: Patient tolerated treatment well Patient left: in bed;with call bell/phone within reach;with family/visitor present   Time: 3875-6433 OT Time Calculation (min): 52 min Charges:  OT General Charges $OT Visit: 1 Procedure OT Evaluation $Initial OT Evaluation Tier I: 1 Procedure OT Treatments $Therapeutic Activity: 8-22 mins G-Codes:    Jules Schick  [phone] 03/01/2015, 12:52 PM

## 2015-03-01 NOTE — Progress Notes (Signed)
PHARMACY NOTE  Pharmacy Consult :  29 y.o. male is currently on Heparin infusion for pulmonary embolism .   Heparin Dosing Wt :  50.8 kg  LABS :  Recent Labs  02/27/15 0642 02/28/15 0500 02/28/15 0625 03/01/15 0445 03/01/15 0450  HGB 8.4*  --  8.3*  --  8.6*  HCT 26.4*  --  26.0*  --  26.5*  PLT 674*  --  657*  --  639*  HEPARINUNFRC 0.35 0.36  --  0.33  --   CREATININE  --   --  0.52*  --   --     MEDICATION: Infusion[s]: Infusions:  . sodium chloride 50 mL/hr at 02/28/15 1907  . heparin 1,500 Units/hr (03/01/15 1117)   Antibiotic[s]: Current Anti-infectives    Start     Dose/Rate Route Frequency Ordered Stop   02/23/15 1350  vancomycin (VANCOCIN) IVPB 750 mg/150 ml premix     750 mg 150 mL/hr over 60 Minutes Intravenous Every 12 hours 02/23/15 1351     02/20/15 1400  aztreonam (AZACTAM) 2 g in dextrose 5 % 50 mL IVPB     2 g 100 mL/hr over 30 Minutes Intravenous 3 times per day 02/19/15 1417     02/20/15 0800  CmetroNIDAZOLE (FLAGYL) IVPB 500 mg     500 mg 100 mL/hr over 60 Minutes Intravenous Every 8 hours 02/19/15 1417        ASSESSMENT :  28 y.o. male continuing on Heparin for pulmonary embolism    Heparin infusing at 1500 units/hr,  Heparin level remains within therapeutic windowNo evidence of bleeding complications observed.  Continues on Vancomycin, aztreonam, and flagyl for infected wounds, Next vancomycin trough due 5/27  GOAL :  Heparin Level  0.3 - 0.7 units/ml  PLAN : 1. Continue Heparin at the same rate, 1500 units/hr. 2. Daily Heparin level, CBC while on Heparin.  Monitor for bleeding complications. Follow Platelet counts. 3. Continue antibiotics at current doses  Thank you. Anette Guarneri, PharmD [phone]   03/01/2015,  11:50 AM

## 2015-03-01 NOTE — Progress Notes (Signed)
Rehab Admissions Coordinator Note:  Patient was screened by Retta Diones for appropriateness for an Inpatient Acute Rehab Consult.  At this time, we are recommending [facility] versus SNF.  Patient known to our rehab team from recent office visits.  Not appropriate for an inpatient rehab admission.  Retta Diones 03/01/2015, 1:57 PM  I can be reached at [phone].

## 2015-03-02 ENCOUNTER — Ambulatory Visit: Payer: Self-pay | Admitting: Registered Nurse

## 2015-03-02 LAB — GLUCOSE, CAPILLARY
GLUCOSE-CAPILLARY: 202 mg/dL — AB (ref 65–99)
GLUCOSE-CAPILLARY: 244 mg/dL — AB (ref 65–99)
GLUCOSE-CAPILLARY: 147 mg/dL — AB (ref 65–99)
Glucose-Capillary: 259 mg/dL — ABNORMAL HIGH (ref 65–99)
Glucose-Capillary: 137 mg/dL — ABNORMAL HIGH (ref 65–99)

## 2015-03-02 LAB — CBC
HEMATOCRIT: 25.1 % — AB (ref 39.0–52.0)
Hemoglobin: 7.9 g/dL — ABNORMAL LOW (ref 13.0–17.0)
MCH: 25.6 pg — ABNORMAL LOW (ref 26.0–34.0)
MCHC: 31.5 g/dL (ref 30.0–36.0)
MCV: 81.5 fL (ref 78.0–100.0)
PLATELETS: 622 10*3/uL — AB (ref 150–400)
RBC: 3.08 MIL/uL — ABNORMAL LOW (ref 4.22–5.81)
RDW: 17.8 % — ABNORMAL HIGH (ref 11.5–15.5)
WBC: 13.8 10*3/uL — ABNORMAL HIGH (ref 4.0–10.5)

## 2015-03-02 LAB — HEPARIN LEVEL (UNFRACTIONATED)
HEPARIN UNFRACTIONATED: 0.15 [IU]/mL — AB (ref 0.30–0.70)
Heparin Unfractionated: <0.1 IU/mL — ABNORMAL LOW (ref 0.30–0.70)

## 2015-03-02 MED ORDER — INSULIN ASPART 100 UNIT/ML ~~LOC~~ SOLN
3.0000 [IU] | Freq: Three times a day (TID) | SUBCUTANEOUS | Status: DC
  Administered 2015-03-02 – 2015-03-06 (×10): 3 [IU] via SUBCUTANEOUS

## 2015-03-02 MED ORDER — INSULIN ASPART 100 UNIT/ML ~~LOC~~ SOLN
0.0000 [IU] | Freq: Three times a day (TID) | SUBCUTANEOUS | Status: DC
  Administered 2015-03-02: 2 [IU] via SUBCUTANEOUS
  Administered 2015-03-02: 8 [IU] via SUBCUTANEOUS
  Administered 2015-03-02: 5 [IU] via SUBCUTANEOUS
  Administered 2015-03-03 (×2): 3 [IU] via SUBCUTANEOUS
  Administered 2015-03-04 – 2015-03-05 (×3): 5 [IU] via SUBCUTANEOUS
  Administered 2015-03-05: 8 [IU] via SUBCUTANEOUS
  Administered 2015-03-06 (×2): 2 [IU] via SUBCUTANEOUS

## 2015-03-02 MED ORDER — INSULIN ASPART 100 UNIT/ML ~~LOC~~ SOLN
8.0000 [IU] | Freq: Once | SUBCUTANEOUS | Status: AC
  Administered 2015-03-02: 8 [IU] via SUBCUTANEOUS

## 2015-03-02 NOTE — Progress Notes (Signed)
PHARMACY NOTE  Pharmacy Consult :  29 y.o. male is currently on Heparin infusion for pulmonary embolism .   Heparin Dosing Wt :  50.8 kg  LABS :  Recent Labs  02/28/15 0500 02/28/15 0625 03/01/15 0445 03/01/15 0450 03/02/15 0825  HGB  --  8.3*  --  8.6* 7.9*  HCT  --  26.0*  --  26.5* 25.1*  PLT  --  657*  --  639* 622*  HEPARINUNFRC 0.36  --  0.33  --  <0.10*  CREATININE  --  0.52*  --   --   --     MEDICATION: Infusion[s]: Infusions:  . sodium chloride 50 mL/hr at 03/01/15 2350  . heparin 1,500 Units/hr (03/02/15 0820)   Antibiotic[s]: Current Anti-infectives    Start     Dose/Rate Route Frequency Ordered Stop   02/23/15 1350  vancomycin (VANCOCIN) IVPB 750 mg/150 ml premix     750 mg 150 mL/hr over 60 Minutes Intravenous Every 12 hours 02/23/15 1351     02/20/15 1400  aztreonam (AZACTAM) 2 g in dextrose 5 % 50 mL IVPB     2 g 100 mL/hr over 30 Minutes Intravenous 3 times per day 02/19/15 1417     02/20/15 0800  CmetroNIDAZOLE (FLAGYL) IVPB 500 mg     500 mg 100 mL/hr over 60 Minutes Intravenous Every 8 hours 02/19/15 1417        ASSESSMENT : 29 y.o. male continuing on Heparin for pulmonary embolism   Heparin infusing at 1500 units/hr,  Heparin level has been  within therapeutic window x 9 days and is undetectable this morning. Heparin running via peripheral line and HL drawn via PICC line.  Per VAS team RN who drew HL, IV was beeping and heparin drip was off.  New bag was hung at 0820 am.  Will assume HL low because drip was off. No evidence of bleeding complications observed. Hg 7.9 continues on Vancomycin, aztreonam, and flagyl for infected wounds, Next vancomycin trough due 5/27  GOAL :  Heparin Level  0.3 - 0.7 units/ml  PLAN : - Continue Heparin at the same rate, 1500 units/hr and recheck HL at 1500. - ? Transition to eliquis soon? - Daily Heparin level, CBC while on Heparin.  Monitor for bleeding complications. Follow  Platelet counts.  - Continue antibiotics at current doses, recheck vanc trough 5/27  Eudelia Bunch, Pharm.D. [phone] 03/02/2015 12:18 PM

## 2015-03-02 NOTE — Progress Notes (Addendum)
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 Tommy Medal and labs showed leukocytosis. Pt was sent to ER for evaluation . Pt has been afebrile, Notes that he has a lot of bloody drainage fro his wounds. Patient started on IV antibiotics (plan is for prolonged therapy due to osteomyelitis). Seen by Dr. Migdalia Dk who has recommended hydrotherapy and no further debridement. Patient has developed DVT and PE; on heparin currently and with future plans to use eliquis. Needs re-evaluation by [facility] on Friday or so to decide further needs on his wound (if further hydrotherapy needed will need SNF for it); if ok to pursuit outpatient wound care on;y; transitioned to eliquis and discharge home with [facility] services.  Assessment/Plan: Active Problems:   Osteomyelitis   Anemia   DKA (diabetic ketoacidoses)   Infected wound   Bilateral pulmonary embolism   Palliative care encounter   Pulmonary embolus   Insomnia   Sacral pain  Acute Hypoxemic respiratory failure -CT angiogram shows Acute pulmonary embolus involving bilateral lower lobes with extensive consolidation of the bilateral lower lobes as described. The consolidations are most likely impart due to pulmonary infarction but underlying pneumonia is not excluded. 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 Johnnye Sima on board and guiding [name] on antibiotic choice and length  -Last debridement. 12/08/14 by Dr.  Migdalia Dk, she has been consulted during this admission and per discussion with her; plan is for hydrotherapy at this moment (started on 5/20); will follow clinical response and will recommend re-evaluation from [place] on Friday to guide further needs on hydrotherapy vs just wound care or needs of debridement (PT asking for it) -Wound care consultation completed, see note from 5/16 -Continue Air mattress -Currently on Aztreonam, Vancomycin, metronidazole -day #11 as per ID rec's -no fever  Chronic hypotension: Continue midodrine, dose increased during this admission to help maintaining BP. -will monitor VS  Hyponatremia, sodium has improved after sugar control and IVF's -patient was not eating much at home -advise to keep himself well hydrated and to maintain good PO intake  Severe Protein calorie malnutrition: will follow rec's from dietitian and continue feeding supplements   Chronic anemia: -Without evidence of acute GI bleeding. But, with high concerns of chronic bleeding from decubitus ulcer -Had 1 unit of PRBC on 3/8.  -Patient received 2 units on 5/14 -will follow Hgb trend -Transfuse for active bleeding or hemoglobin less than 7 -Hgb stable; 9.4  Uncontrolled DM 1: Mild DKA with metabolic acidosis on admission- -patient acidosis and mild DKA now resolved -A1C 8.1 -discussed with mother and patient regarding importance on sugar control and medication compliance -will monitor and continue adjusting hypoglycemic regimen as needed; CBG's elevated -will continue SSI (moderate), lantus 25 units and meal coverage just 3 units   Neurogenic bladder:  -Had Klebsiella and BURKHOLDERIA UTI on previous admissions; no dysuria -will monitor -currently on broad spectrum antibiotics  Spinal cord infarction and quadriplegia: -Patient awake alert and oriented 3. Reports pain is much better -complaining of some neuropathy and unrelieved pain (which he has  hx of, especially with diabetes and  spinal cord injury) -will continue Lyrica and baclofen; last one dose adjusted to 20mg  TID (as per home medication orders according to mother) -will adjust fentanyl patch, continue oxycodone for breakthrough and add tylenol to help with pain  Insomnia: will continue Remeron (helping with sleep and appetite)  S/p Diverting Colostomy and suprapubic catheter  Reactive thrombocytosis: on heparin drip.  Code Status: full Family Communication: discussed with mother at bedside Disposition Plan: Overall prognosis is extremely grim, plastic surgery consultation and final recommendations pending    Consultants:  Infectious disease  Palliative Care  Procedures:  None  Antibiotics: Aztreonam, vancomycin and flagyl  5/13>>  HPI/Subjective: Patient is AAOX3, reports good appetite and endorses that the pain is much better. No fever. CBG running high as patient eating more.  Objective: Filed Vitals:   03/01/15 0404 03/01/15 0802 03/01/15 1236 03/01/15 2209  BP: 100/73 97/65 99/63  107/68  Pulse: 93 92 95 96  Temp: 98.3 F (36.8 C) 98.3 F (36.8 C) 98 F (36.7 C) 98 F (36.7 C)  TempSrc: Oral Oral Oral Oral  Resp: 18 16 16 18   Height:      Weight:      SpO2: 100% 99% 99% 100%    Intake/Output Summary (Last 24 hours) at 03/02/15 0004 Last data filed at 03/01/15 2352  Gross per 24 hour  Intake    250 ml  Output   6300 ml  Net  -6050 ml    Exam: Gen: Still Complaining of pain in his back (but endorses that new regimen is controlling pain better); better appetite and drinking all feeding supplements. No fever Lungs: Clear to auscultation bilaterally without wheezes or crackles; good air movement Cardiovascular: Regular rate and rhythm without murmur gallop or rub normal S1 and S2 Abdomen: Nontender, nondistended, soft, bowel sounds positive, no rebound, no ascites, no appreciable mass Extremities: No significant cyanosis, clubbing, or edema bilateral lower  extremities Neurological:alert to place and person; + quadriplegia  Skin: Multiple deep wounds to right buttock and perineal region. Large pressure wound to mid back. Wounds w mild, malodorous, yellowish-green drainage. No surrounding cellulitis appreciated. No fluctuance/abscess noted. No crepitus. Some necrotic tissue seen.   Data Reviewed: Basic Metabolic Panel:  Recent Labs Lab 02/23/15 0526 02/25/15 0452 02/28/15 0625  NA 134* 133* 134*  K 3.6 3.5 3.9  CL 111 110 111  CO2 18* 20* 20*  GLUCOSE 265* 281* 171*  BUN 6 7 10   CREATININE 0.53* 0.49* 0.52*  CALCIUM 8.7* 8.6* 8.3*  MG 1.3*  --   --     Liver Function Tests:  Recent Labs Lab 02/23/15 0526  AST 8*  ALT 7*  ALKPHOS 98  BILITOT 0.3  PROT 5.5*  ALBUMIN 1.4*   CBC:  Recent Labs Lab 02/25/15 0452 02/26/15 0500 02/27/15 0642 02/28/15 0625 03/01/15 0450  WBC 18.2* 15.5* 16.0* 14.4* 14.2*  HGB 9.4* 9.2* 8.4* 8.3* 8.6*  HCT 29.4* 28.0* 26.4* 26.0* 26.5*  MCV 81.0 81.6 81.2 81.5 81.8  PLT 815* 721* 674* 657* 639*   CBG:  Recent Labs Lab 03/01/15 0800 03/01/15 1233 03/01/15 1747 03/01/15 2018 03/01/15 2346  GLUCAP 185* 249* 241* 289* 433*    Recent Results (from the past 240 hour(s))  Wound culture     Status: None   Collection Time: 02/21/15  6:32 AM  Result Value Ref Range Status   Specimen Description WOUND SACRAL  Final   Special Requests Normal  Final  Gram Stain   Final    NO WBC SEEN NO SQUAMOUS EPITHELIAL CELLS SEEN FEW GRAM VARIABLE ROD Performed at [facility]    Culture   Final    MULTIPLE ORGANISMS PRESENT, NONE PREDOMINANT Note: NO STAPHYLOCOCCUS AUREUS ISOLATED NO GROUP A STREP (S.PYOGENES) ISOLATED Performed at [facility]    Report Status 02/24/2015 FINAL  Final     Studies: Ct Angio Chest Pe W/cm &/or Wo Cm  02/20/2015   CLINICAL DATA:  Acute respiratory failure, hypoxia, shortness of breath, chest pain.  EXAM: CT ANGIOGRAPHY CHEST WITH CONTRAST   TECHNIQUE: Multidetector CT imaging of the chest was performed using the standard protocol during bolus administration of intravenous contrast. Multiplanar CT image reconstructions and MIPs were obtained to evaluate the vascular anatomy.  CONTRAST:  51mL OMNIPAQUE IOHEXOL 350 MG/ML SOLN  COMPARISON:  January 11, 2015  FINDINGS: There is acute pulmonary embolus in segmental and subsegmental pulmonary arteries of the left lower lobe. There is acute pulmonary embolus in subsegmental pulmonary arteries of the right lower lobe. There are extensive consolidation of the right lower lobe and the medial aspect of the left lower lobe. There are small bilateral pleural effusions. There are lymphadenopathy in the mediastinum. The aorta is normal. There is small pericardial effusion. The visualized upper abdominal structures are normal.  Review of the MIP images confirms the above findings.  IMPRESSION: Acute pulmonary embolus involving bilateral lower lobes with extensive consolidation of the bilateral lower lobes as described. The consolidations are most likely impart due to pulmonary infarction but underlying pneumonia is not excluded.  Critical Value/emergent results were called by telephone at the time of interpretation on 02/20/2015 at 8:33 am to patient's nurse Clarisa Fling who verbally acknowledged these results.   Electronically Signed   By: Abelardo Diesel M.D.   On: 02/20/2015 08:40   Mr Lumbar Spine Wo Contrast  02/19/2015   CLINICAL DATA:  Infected sacral decubitus ulcers. Fever. Leukocytosis.  EXAM: MRI LUMBAR SPINE WITHOUT CONTRAST, MRI OF THE SACRUM WITH AND WITHOUT CONTRAST  TECHNIQUE: Multiplanar, multisequence MR imaging of the lumbar spine and sacrum was performed. Contrast was performed for the sacral imaging.  CONTRAST:  10 cc MultiHance  COMPARISON:  CT scan of the abdomen and pelvis dated 01/11/2015 and MRI of the pelvis dated 01/08/2015  FINDINGS: MRI of the lumbar spine:  Normal conus tip at L1-2.  There is diffuse edema in the posterior paraspinal musculature and in the gluteal muscles. There is also atrophy of the paraspinal musculature. The patient is quadriplegic. The paraspinal soft tissues are otherwise normal.  The discs from T11-12 through L5-S1 are normal. There is low signal throughout the bones of the lumbar spine which may represent a red marrow reactivation. There is no evidence of spinal infection. No spinal or foraminal stenosis or facet arthritis.  MRI of the sacrum:  There is abnormal edema and enhancement of the right ischial tuberosity. There is a deep decubitus ulcer that extends to the ischial tuberosity and there is a small abscess extending to enhance cm laterally from the decubitus ulcer at the ischial tuberosity. This pus collection does communicate with the decubitus ulcer.  There is devitalized soft tissue superficial to the left ischial tuberosity at the site of another decubitus ulcer. There is also devitalized soft tissue at the site of the soft tissue ulcer overlying the sacrum. There is slight edema and enhancement of the remnant of the fifth sacral segment and of the entire fourth thick  sacral segment but there is no bone destruction.  No evidence of epidural infection in the sacrum.  There is abnormal subcutaneous edema throughout the pelvis with muscle atrophy.  IMPRESSION: 1. Multiple sacral and ischial tuberosity decubitus ulcers. Pus extends from the depths of the right ischial tuberosity ulcer 2.5 cm laterally. 2. Abnormal edema and enhancement of the right ischial tuberosity with loss of the cortical margin, consistent with osteomyelitis. 3. Abnormal edema and enhancement of the fourth sacral segment and of the remnant of the fifth sacral segment. This is immediately adjacent to a sacral decubitus ulcer. This is not definitive for osteomyelitis however.   Electronically Signed   By: Lorriane Shire M.D.   On: 02/19/2015 13:16   Mr Sacrum/si Joints W Wo  Contrast  02/19/2015   CLINICAL DATA:  Infected sacral decubitus ulcers. Fever. Leukocytosis.  EXAM: MRI LUMBAR SPINE WITHOUT CONTRAST, MRI OF THE SACRUM WITH AND WITHOUT CONTRAST  TECHNIQUE: Multiplanar, multisequence MR imaging of the lumbar spine and sacrum was performed. Contrast was performed for the sacral imaging.  CONTRAST:  10 cc MultiHance  COMPARISON:  CT scan of the abdomen and pelvis dated 01/11/2015 and MRI of the pelvis dated 01/08/2015  FINDINGS: MRI of the lumbar spine:  Normal conus tip at L1-2. There is diffuse edema in the posterior paraspinal musculature and in the gluteal muscles. There is also atrophy of the paraspinal musculature. The patient is quadriplegic. The paraspinal soft tissues are otherwise normal.  The discs from T11-12 through L5-S1 are normal. There is low signal throughout the bones of the lumbar spine which may represent a red marrow reactivation. There is no evidence of spinal infection. No spinal or foraminal stenosis or facet arthritis.  MRI of the sacrum:  There is abnormal edema and enhancement of the right ischial tuberosity. There is a deep decubitus ulcer that extends to the ischial tuberosity and there is a small abscess extending to enhance cm laterally from the decubitus ulcer at the ischial tuberosity. This pus collection does communicate with the decubitus ulcer.  There is devitalized soft tissue superficial to the left ischial tuberosity at the site of another decubitus ulcer. There is also devitalized soft tissue at the site of the soft tissue ulcer overlying the sacrum. There is slight edema and enhancement of the remnant of the fifth sacral segment and of the entire fourth thick sacral segment but there is no bone destruction.  No evidence of epidural infection in the sacrum.  There is abnormal subcutaneous edema throughout the pelvis with muscle atrophy.  IMPRESSION: 1. Multiple sacral and ischial tuberosity decubitus ulcers. Pus extends from the depths of  the right ischial tuberosity ulcer 2.5 cm laterally. 2. Abnormal edema and enhancement of the right ischial tuberosity with loss of the cortical margin, consistent with osteomyelitis. 3. Abnormal edema and enhancement of the fourth sacral segment and of the remnant of the fifth sacral segment. This is immediately adjacent to a sacral decubitus ulcer. This is not definitive for osteomyelitis however.   Electronically Signed   By: Lorriane Shire M.D.   On: 02/19/2015 13:16   Dg Chest Port 1 View  02/20/2015   CLINICAL DATA:  Patient woke up this morning with chest pain and shortness of breath.  EXAM: PORTABLE CHEST - 1 VIEW  COMPARISON:  CT chest 01/11/2015.  Chest 01/10/2015.  FINDINGS: Borderline heart size with normal pulmonary vascularity. Patchy bilateral basilar airspace disease is improving since previous study. Small bilateral pleural effusions. Right PICC catheter tip  is over the cavoatrial junction. No pneumothorax. Old fracture deformity of the right clavicle.  IMPRESSION: Basilar airspace infiltrates are improving since previous study. Small bilateral pleural effusions.   Electronically Signed   By: Lucienne Capers M.D.   On: 02/20/2015 05:42    Scheduled Meds: . aztreonam  2 g Intravenous 3 times per day  . baclofen  20 mg Oral TID  . collagenase   Topical Daily  . feeding supplement (GLUCERNA SHAKE)  237 mL Oral TID BM  . fentaNYL  50 mcg Transdermal Q72H  . insulin aspart  0-9 Units Subcutaneous TID WC  . insulin glargine  25 Units Subcutaneous Daily  . loratadine  10 mg Oral Daily  . magnesium oxide  400 mg Oral Daily  . metoCLOPramide  5 mg Oral Daily  . metroNIDAZOLE  500 mg Oral Q8H  . midodrine  10 mg Oral TID WC  . mirtazapine  7.5 mg Oral QHS  . pantoprazole  40 mg Oral Daily  . potassium chloride SA  20 mEq Oral Daily  . pregabalin  100 mg Oral TID WC  . pregabalin  200 mg Oral QHS  . sodium chloride  10-40 mL Intracatheter Q12H  . vancomycin  750 mg Intravenous Q12H    Continuous Infusions: . sodium chloride 50 mL/hr at 03/01/15 2350  . heparin 1,500 Units/hr (03/01/15 1117)    Active Problems:   Osteomyelitis   Anemia   DKA (diabetic ketoacidoses)   Infected wound   Bilateral pulmonary embolism   Palliative care encounter   Pulmonary embolus   Insomnia   Sacral pain    Time spent: 35 minutes   Barton Dubois  [facility] Pager [phone]. If 7PM-7AM, please contact night-coverage at [email or web address], password [facility] 03/02/2015, 12:04 AM  LOS: 12 days

## 2015-03-02 NOTE — Progress Notes (Signed)
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 Tommy Medal and labs showed leukocytosis. Pt was sent to ER for evaluation . Pt has been afebrile, Notes that he has a lot of bloody drainage fro his wounds. Patient started on IV antibiotics (plan is for prolonged therapy due to osteomyelitis). Seen by Dr. Migdalia Dk who has recommended hydrotherapy and no further debridement. Patient has developed DVT and PE; on heparin currently and with future plans to use eliquis. Needs re-evaluation by [place] on Friday or so to decide further needs on his wound (if further hydrotherapy needed will need SNF for it); if ok to pursuit outpatient wound care on;y; transitioned to eliquis and discharge home with [facility] services.  Assessment/Plan: Active Problems:   Osteomyelitis   Anemia   DKA (diabetic ketoacidoses)   Infected wound   Bilateral pulmonary embolism   Palliative care encounter   Pulmonary embolus   Insomnia   Sacral pain  Acute Hypoxemic respiratory failure -CT angiogram shows Acute pulmonary embolus involving bilateral lower lobes with extensive consolidation of the bilateral lower lobes as described. The consolidations are most likely impart due to pulmonary infarction but underlying pneumonia is not excluded. 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 Johnnye Sima on board and guiding [name] on antibiotic choice and length  -Last debridement.  12/08/14 by Dr. Migdalia Dk, she has been consulted during this admission and per discussion with her; plan is for hydrotherapy at this moment (started on 5/20); will follow clinical response and will recommend re-evaluation from [place] on Friday to guide further needs on hydrotherapy vs just wound care or needs of debridement (PT asking for it) will discuss with surgeon next if not already addressed -Wound care consultation completed, see note from 5/16 -Continue Air mattress -Currently on Aztreonam, Vancomycin, metronidazole -day #11 as per ID rec's  Chronic hypotension: Continue midodrine, dose increased during this admission to help maintaining BP. -will monitor VS  Hyponatremia, sodium has improved after sugar control and IVF's -patient was not eating much at home -advise to keep himself well hydrated and to maintain good PO intake  Severe Protein calorie malnutrition: will follow rec's from dietitian and continue feeding supplements   Chronic anemia: -Without evidence of acute GI bleeding. But, with high concerns of chronic bleeding from decubitus ulcer -Had 1 unit of PRBC on 3/8.  -Patient received 2 units on 5/14 -will follow Hgb trend -Transfuse for active bleeding or hemoglobin less than 7  Uncontrolled DM 1: Mild DKA with metabolic acidosis on admission- -patient acidosis and mild DKA now resolved -A1C 8.1 -discussed with mother and patient regarding importance on sugar control and medication compliance -will monitor and continue adjusting hypoglycemic regimen as needed; CBG's elevated -will continue SSI (moderate), lantus 25 units and meal coverage just 3 units   Neurogenic bladder:  -Had Klebsiella and BURKHOLDERIA UTI on previous admissions; no dysuria -will monitor -currently on broad spectrum antibiotics  Spinal cord infarction and quadriplegia: -Patient awake alert and oriented 3. Reports pain is much better -complaining of some  neuropathy and unrelieved pain (which he  has hx of, especially with diabetes and spinal cord injury) -will continue Lyrica and baclofen; last one dose adjusted to 20mg  TID (as per home medication orders according to mother) -will adjust fentanyl patch, continue oxycodone for breakthrough and add tylenol to help with pain  Insomnia: will continue Remeron (helping with sleep and appetite)  S/p Diverting Colostomy and suprapubic catheter  Reactive thrombocytosis: on heparin drip.  Code Status: full Family Communication: discussed with mother at bedside Disposition Plan: Overall prognosis is extremely grim, plastic surgery consultation and final recommendations pending    Consultants:  Infectious disease  Palliative Care  Procedures:  None  Antibiotics: Aztreonam, vancomycin and flagyl  5/13>>  HPI/Subjective: Patient has no new complaints today  Objective: Filed Vitals:   03/01/15 2209 03/02/15 0537 03/02/15 1048 03/02/15 1334  BP: 107/68 100/65 94/61 111/75  Pulse: 96 91 91   Temp: 98 F (36.7 C) 98.2 F (36.8 C) 98.2 F (36.8 C) 98.2 F (36.8 C)  TempSrc: Oral Oral Oral Oral  Resp: 18 18 18 18   Height:      Weight:      SpO2: 100% 100% 100% 100%    Intake/Output Summary (Last 24 hours) at 03/02/15 1846 Last data filed at 03/02/15 1755  Gross per 24 hour  Intake 4345.41 ml  Output   6750 ml  Net -2404.59 ml    Exam: Gen: Still Complaining of pain in his back (but endorses that new regimen is controlling pain better); better appetite and drinking all feeding supplements. No fever Lungs: Clear to auscultation bilaterally without wheezes or crackles; good air movement Cardiovascular: Regular rate and rhythm without murmur gallop or rub normal S1 and S2 Abdomen: Nontender, nondistended, soft, bowel sounds positive, no rebound, no ascites, no appreciable mass Extremities: No significant cyanosis, clubbing, or edema bilateral lower extremities Neurological:alert to place and person; + quadriplegia   Skin: Multiple deep wounds to right buttock and perineal region. Large pressure wound to mid back. Wounds w mild, malodorous, yellowish-green drainage. No surrounding cellulitis appreciated. No fluctuance/abscess noted. No crepitus. Some necrotic tissue seen.   Data Reviewed: Basic Metabolic Panel:  Recent Labs Lab 02/25/15 0452 02/28/15 0625  NA 133* 134*  K 3.5 3.9  CL 110 111  CO2 20* 20*  GLUCOSE 281* 171*  BUN 7 10  CREATININE 0.49* 0.52*  CALCIUM 8.6* 8.3*    Liver Function Tests: No results for input(s): AST, ALT, ALKPHOS, BILITOT, PROT, ALBUMIN in the last 168 hours. CBC:  Recent Labs Lab 02/26/15 0500 02/27/15 0642 02/28/15 0625 03/01/15 0450 03/02/15 0825  WBC 15.5* 16.0* 14.4* 14.2* 13.8*  HGB 9.2* 8.4* 8.3* 8.6* 7.9*  HCT 28.0* 26.4* 26.0* 26.5* 25.1*  MCV 81.6 81.2 81.5 81.8 81.5  PLT 721* 674* 657* 639* 622*   CBG:  Recent Labs Lab 03/01/15 2346 03/02/15 0539 03/02/15 0814 03/02/15 1201 03/02/15 1652  GLUCAP 433* 202* 244* 259* 137*    Recent Results (from the past 240 hour(s))  Wound culture     Status: None   Collection Time: 02/21/15  6:32 AM  Result Value Ref Range Status   Specimen Description WOUND SACRAL  Final   Special Requests Normal  Final   Gram Stain   Final    NO WBC SEEN NO SQUAMOUS EPITHELIAL CELLS SEEN FEW GRAM VARIABLE ROD Performed at [facility]    Culture   Final    MULTIPLE ORGANISMS PRESENT, NONE PREDOMINANT Note: NO STAPHYLOCOCCUS AUREUS  ISOLATED NO GROUP A STREP (S.PYOGENES) ISOLATED Performed at [facility]    Report Status 02/24/2015 FINAL  Final     Studies: Ct Angio Chest Pe W/cm &/or Wo Cm  02/20/2015   CLINICAL DATA:  Acute respiratory failure, hypoxia, shortness of breath, chest pain.  EXAM: CT ANGIOGRAPHY CHEST WITH CONTRAST  TECHNIQUE: Multidetector CT imaging of the chest was performed using the standard protocol during bolus administration of intravenous contrast. Multiplanar  CT image reconstructions and MIPs were obtained to evaluate the vascular anatomy.  CONTRAST:  13mL OMNIPAQUE IOHEXOL 350 MG/ML SOLN  COMPARISON:  January 11, 2015  FINDINGS: There is acute pulmonary embolus in segmental and subsegmental pulmonary arteries of the left lower lobe. There is acute pulmonary embolus in subsegmental pulmonary arteries of the right lower lobe. There are extensive consolidation of the right lower lobe and the medial aspect of the left lower lobe. There are small bilateral pleural effusions. There are lymphadenopathy in the mediastinum. The aorta is normal. There is small pericardial effusion. The visualized upper abdominal structures are normal.  Review of the MIP images confirms the above findings.  IMPRESSION: Acute pulmonary embolus involving bilateral lower lobes with extensive consolidation of the bilateral lower lobes as described. The consolidations are most likely impart due to pulmonary infarction but underlying pneumonia is not excluded.  Critical Value/emergent results were called by telephone at the time of interpretation on 02/20/2015 at 8:33 am to patient's nurse Clarisa Fling who verbally acknowledged these results.   Electronically Signed   By: Abelardo Diesel M.D.   On: 02/20/2015 08:40   Mr Lumbar Spine Wo Contrast  02/19/2015   CLINICAL DATA:  Infected sacral decubitus ulcers. Fever. Leukocytosis.  EXAM: MRI LUMBAR SPINE WITHOUT CONTRAST, MRI OF THE SACRUM WITH AND WITHOUT CONTRAST  TECHNIQUE: Multiplanar, multisequence MR imaging of the lumbar spine and sacrum was performed. Contrast was performed for the sacral imaging.  CONTRAST:  10 cc MultiHance  COMPARISON:  CT scan of the abdomen and pelvis dated 01/11/2015 and MRI of the pelvis dated 01/08/2015  FINDINGS: MRI of the lumbar spine:  Normal conus tip at L1-2. There is diffuse edema in the posterior paraspinal musculature and in the gluteal muscles. There is also atrophy of the paraspinal musculature. The patient is  quadriplegic. The paraspinal soft tissues are otherwise normal.  The discs from T11-12 through L5-S1 are normal. There is low signal throughout the bones of the lumbar spine which may represent a red marrow reactivation. There is no evidence of spinal infection. No spinal or foraminal stenosis or facet arthritis.  MRI of the sacrum:  There is abnormal edema and enhancement of the right ischial tuberosity. There is a deep decubitus ulcer that extends to the ischial tuberosity and there is a small abscess extending to enhance cm laterally from the decubitus ulcer at the ischial tuberosity. This pus collection does communicate with the decubitus ulcer.  There is devitalized soft tissue superficial to the left ischial tuberosity at the site of another decubitus ulcer. There is also devitalized soft tissue at the site of the soft tissue ulcer overlying the sacrum. There is slight edema and enhancement of the remnant of the fifth sacral segment and of the entire fourth thick sacral segment but there is no bone destruction.  No evidence of epidural infection in the sacrum.  There is abnormal subcutaneous edema throughout the pelvis with muscle atrophy.  IMPRESSION: 1. Multiple sacral and ischial tuberosity decubitus ulcers. Pus extends from the depths  of the right ischial tuberosity ulcer 2.5 cm laterally. 2. Abnormal edema and enhancement of the right ischial tuberosity with loss of the cortical margin, consistent with osteomyelitis. 3. Abnormal edema and enhancement of the fourth sacral segment and of the remnant of the fifth sacral segment. This is immediately adjacent to a sacral decubitus ulcer. This is not definitive for osteomyelitis however.   Electronically Signed   By: Lorriane Shire M.D.   On: 02/19/2015 13:16   Mr Sacrum/si Joints W Wo Contrast  02/19/2015   CLINICAL DATA:  Infected sacral decubitus ulcers. Fever. Leukocytosis.  EXAM: MRI LUMBAR SPINE WITHOUT CONTRAST, MRI OF THE SACRUM WITH AND WITHOUT  CONTRAST  TECHNIQUE: Multiplanar, multisequence MR imaging of the lumbar spine and sacrum was performed. Contrast was performed for the sacral imaging.  CONTRAST:  10 cc MultiHance  COMPARISON:  CT scan of the abdomen and pelvis dated 01/11/2015 and MRI of the pelvis dated 01/08/2015  FINDINGS: MRI of the lumbar spine:  Normal conus tip at L1-2. There is diffuse edema in the posterior paraspinal musculature and in the gluteal muscles. There is also atrophy of the paraspinal musculature. The patient is quadriplegic. The paraspinal soft tissues are otherwise normal.  The discs from T11-12 through L5-S1 are normal. There is low signal throughout the bones of the lumbar spine which may represent a red marrow reactivation. There is no evidence of spinal infection. No spinal or foraminal stenosis or facet arthritis.  MRI of the sacrum:  There is abnormal edema and enhancement of the right ischial tuberosity. There is a deep decubitus ulcer that extends to the ischial tuberosity and there is a small abscess extending to enhance cm laterally from the decubitus ulcer at the ischial tuberosity. This pus collection does communicate with the decubitus ulcer.  There is devitalized soft tissue superficial to the left ischial tuberosity at the site of another decubitus ulcer. There is also devitalized soft tissue at the site of the soft tissue ulcer overlying the sacrum. There is slight edema and enhancement of the remnant of the fifth sacral segment and of the entire fourth thick sacral segment but there is no bone destruction.  No evidence of epidural infection in the sacrum.  There is abnormal subcutaneous edema throughout the pelvis with muscle atrophy.  IMPRESSION: 1. Multiple sacral and ischial tuberosity decubitus ulcers. Pus extends from the depths of the right ischial tuberosity ulcer 2.5 cm laterally. 2. Abnormal edema and enhancement of the right ischial tuberosity with loss of the cortical margin, consistent with  osteomyelitis. 3. Abnormal edema and enhancement of the fourth sacral segment and of the remnant of the fifth sacral segment. This is immediately adjacent to a sacral decubitus ulcer. This is not definitive for osteomyelitis however.   Electronically Signed   By: Lorriane Shire M.D.   On: 02/19/2015 13:16   Dg Chest Port 1 View  02/20/2015   CLINICAL DATA:  Patient woke up this morning with chest pain and shortness of breath.  EXAM: PORTABLE CHEST - 1 VIEW  COMPARISON:  CT chest 01/11/2015.  Chest 01/10/2015.  FINDINGS: Borderline heart size with normal pulmonary vascularity. Patchy bilateral basilar airspace disease is improving since previous study. Small bilateral pleural effusions. Right PICC catheter tip is over the cavoatrial junction. No pneumothorax. Old fracture deformity of the right clavicle.  IMPRESSION: Basilar airspace infiltrates are improving since previous study. Small bilateral pleural effusions.   Electronically Signed   By: Lucienne Capers M.D.   On: 02/20/2015 05:42  Scheduled Meds: . aztreonam  2 g Intravenous 3 times per day  . baclofen  20 mg Oral TID  . collagenase   Topical Daily  . feeding supplement (GLUCERNA SHAKE)  237 mL Oral TID BM  . fentaNYL  50 mcg Transdermal Q72H  . insulin aspart  0-15 Units Subcutaneous TID WC  . insulin aspart  3 Units Subcutaneous TID WC  . insulin glargine  25 Units Subcutaneous Daily  . loratadine  10 mg Oral Daily  . magnesium oxide  400 mg Oral Daily  . metoCLOPramide  5 mg Oral Daily  . metroNIDAZOLE  500 mg Oral Q8H  . midodrine  10 mg Oral TID WC  . mirtazapine  7.5 mg Oral QHS  . pantoprazole  40 mg Oral Daily  . potassium chloride SA  20 mEq Oral Daily  . pregabalin  100 mg Oral TID WC  . pregabalin  200 mg Oral QHS  . sodium chloride  10-40 mL Intracatheter Q12H  . vancomycin  750 mg Intravenous Q12H   Continuous Infusions: . sodium chloride 50 mL/hr at 03/01/15 2350  . heparin 1,500 Units/hr (03/02/15 0820)     Active Problems:   Osteomyelitis   Anemia   DKA (diabetic ketoacidoses)   Infected wound   Bilateral pulmonary embolism   Palliative care encounter   Pulmonary embolus   Insomnia   Sacral pain    Time spent: 35 minutes   Velvet Bathe  [facility] Pager [phone] If 7PM-7AM, please contact night-coverage at [email or web address], password [facility] 03/02/2015, 6:46 PM  LOS: 12 days

## 2015-03-02 NOTE — Progress Notes (Signed)
Physical Therapy Wound Treatment Patient Details  Name: Jason [name] MRN: [id number] Date of Birth: 03-Dec-1985  Today's Date: 03/02/2015 Time: 9476-5465 Time Calculation (min): 65 min  Subjective  Subjective: No c/o's Patient and Family Stated Goals: Wounds to heal.  Date of Onset:  (pt unsure exact onset, but states "months".) Prior Treatments: I+D by plastics and dressing changes with Santyl.    Pain Score:  Premedicated with IV meds.  Pt indicates "uncomfortable".  Wound Assessment  Pressure Ulcer 11/08/14 Stage II -  Partial thickness loss of dermis presenting as a shallow open ulcer with a red, pink wound bed without slough. wound is full thickness, NOT a pressure ulcer (Active)  Dressing Type Other (Comment) 02/23/2015  9:46 PM  Dressing Clean;Dry;Intact 02/27/2015  9:17 AM     Pressure Ulcer 11/08/14 Stage II -  Partial thickness loss of dermis presenting as a shallow open ulcer with a red, pink wound bed without slough. (Active)     Pressure Ulcer Stage III -  Full thickness tissue loss. Subcutaneous fat may be visible but bone, tendon or muscle are NOT exposed. (Active)     Pressure Ulcer 12/13/14 Stage II -  Partial thickness loss of dermis presenting as a shallow open ulcer with a red, pink wound bed without slough. sloughing, tan thick drainage (Active)     Pressure Ulcer 08/13/14 Stage IV - Full thickness tissue loss with exposed bone, tendon or muscle. pt with POA first noted 08/13/14 (Active)     Pressure Ulcer 08/13/14 Stage IV - Full thickness tissue loss with exposed bone, tendon or muscle. was POA, first assessed by [facility] Team 08/13/14 (Active)     Pressure Ulcer 01/06/15 Stage II -  Partial thickness loss of dermis presenting as a shallow open ulcer with a red, pink wound bed without slough. (Active)     Pressure Ulcer 02/18/15 Stage IV - Full thickness tissue loss with exposed bone, tendon or muscle. (Active)  Dressing Type ABD;Barrier Film (skin prep);Gauze  (Comment);Tape dressing;Moist to dry 03/02/2015 10:00 AM  Dressing Changed 03/02/2015 10:00 AM  Dressing Change Frequency Twice a day 03/02/2015 10:00 AM  State of Healing Early/partial granulation 03/02/2015 10:00 AM  Site / Wound Assessment Yellow;Pink;Red 03/02/2015 10:00 AM  % Wound base Red or Granulating 65% 03/02/2015 10:00 AM  % Wound base Yellow 35% 03/02/2015 10:00 AM  % Wound base Black 0% 03/02/2015 10:00 AM  % Wound base Other (Comment) 0% 03/02/2015 10:00 AM  Peri-wound Assessment Intact 03/02/2015 10:00 AM  Wound Length (cm) 9.9 cm 02/25/2015  2:00 PM  Wound Width (cm) 8.6 cm 02/25/2015  2:00 PM  Wound Depth (cm) 2.9 cm 02/25/2015  2:00 PM  Undermining (cm) Undermines from 10:00 to 3:00 and deepest portion is 2.7cm. 02/25/2015  2:00 PM  Margins Unattached edges (unapproximated) 03/02/2015 10:00 AM  Drainage Amount Moderate 03/02/2015 10:00 AM  Drainage Description Serosanguineous 03/02/2015 10:00 AM  Treatment Debridement (Selective);Hydrotherapy (Pulse lavage);Packing (Saline gauze) 03/02/2015 10:00 AM   Santyl applied to wound bed prior to applying dressing.   Pressure Ulcer 02/18/15 Stage IV - Full thickness tissue loss with exposed bone, tendon or muscle. (Active)  Dressing Type Gauze (Comment);Moist to dry 03/01/2015  3:00 PM  Dressing Clean;Dry;Intact 03/01/2015  3:00 PM  Dressing Change Frequency Daily 03/01/2015  3:00 PM  State of Healing Non-healing 03/01/2015  3:00 PM  Site / Wound Assessment Other (Comment) 03/01/2015  3:00 PM  Wound Length (cm) 7 cm 02/18/2015  9:00 PM  Wound Width (  cm) 6 cm 02/18/2015  9:00 PM  Wound Depth (cm) 4 cm 02/18/2015  9:00 PM     Pressure Ulcer 02/18/15 Stage II -  Partial thickness loss of dermis presenting as a shallow open ulcer with a red, pink wound bed without slough. (Active)  Dressing Type Impregnated gauze (petrolatum);ABD 03/01/2015  3:00 PM  Dressing Changed;Clean 03/01/2015  3:00 PM  Dressing Change Frequency Every 3 days 03/01/2015  3:00 PM   State of Healing Early/partial granulation 03/01/2015  3:00 PM  Site / Wound Assessment Pink;Red 03/01/2015  3:00 PM  % Wound base Red or Granulating 100% 02/26/2015  5:00 PM     Pressure Ulcer 02/18/15 Unstageable - Full thickness tissue loss in which the base of the ulcer is covered by slough (yellow, tan, gray, green or brown) and/or eschar (tan, brown or black) in the wound bed. (Active)  Dressing Type Gauze (Comment) 03/01/2015  3:00 PM  Dressing Changed;Clean 03/01/2015  3:00 PM  Dressing Change Frequency PRN 03/01/2015  3:00 PM  State of Healing Early/partial granulation 03/01/2015  3:00 PM  Site / Wound Assessment Dressing in place / Unable to assess 02/27/2015  8:35 PM     Pressure Ulcer 02/20/15 Unstageable - Full thickness tissue loss in which the base of the ulcer is covered by slough (yellow, tan, gray, green or brown) and/or eschar (tan, brown or black) in the wound bed. (Active)  Dressing Type Barrier Film (skin prep);Gauze (Comment);Moist to dry;Tape dressing;ABD 03/02/2015 10:00 AM  Dressing Changed 03/02/2015 10:00 AM  Dressing Change Frequency Daily 03/02/2015 10:00 AM  State of Healing Eschar 03/02/2015 10:00 AM  Site / Wound Assessment Brown;Pink;Yellow;Black 03/02/2015 10:00 AM  % Wound base Red or Granulating 5% 03/02/2015 10:00 AM  % Wound base Yellow 65% 03/02/2015 10:00 AM  % Wound base Black 30% 03/02/2015 10:00 AM  Peri-wound Assessment Intact 03/02/2015 10:00 AM  Wound Length (cm) 6 cm 03/02/2015 10:00 AM  Wound Width (cm) 6.4 cm 03/02/2015 10:00 AM  Wound Depth (cm) 0 cm 03/02/2015 10:00 AM  Undermining (cm) Undermines 12:00 to 4:00 with deepest portion 3.9cm.   02/25/2015  2:00 PM  Margins Unattached edges (unapproximated) 03/02/2015 10:00 AM  Drainage Amount Minimal 03/02/2015 10:00 AM  Drainage Description Odor 03/02/2015 10:00 AM  Treatment Debridement (Selective);Hydrotherapy (Pulse lavage);Packing (Saline gauze) 03/02/2015 10:00 AM   Santyl applied to wound bed prior to  applying dressing.   Pressure Ulcer 02/20/15 Stage II -  Partial thickness loss of dermis presenting as a shallow open ulcer with a red, pink wound bed without slough. (Active)  Dressing Type Gauze (Comment) 02/28/2015  8:28 PM  Dressing Clean;Dry;Intact 02/28/2015  8:28 PM  Dressing Change Frequency Every 3 days 02/23/2015  2:31 PM  Site / Wound Assessment Dressing in place / Unable to assess 03/01/2015  3:00 PM  Wound Length (cm) 4 cm 02/22/2015  8:23 AM  Wound Width (cm) 4.5 cm 02/22/2015  8:23 AM  Wound Depth (cm) 1 cm 02/22/2015  8:23 AM  Drainage Amount Minimal 02/22/2015  8:23 AM     Wound / Incision (Open or Dehisced) 12/19/14 Scrotum small open area (Active)     Wound / Incision (Open or Dehisced) 02/25/15 Incision - Open Ischial tuberosity Right Right Ischium (Active)  Dressing Type Barrier Film (skin prep);Gauze (Comment);Moist to dry;Tape dressing;ABD 03/02/2015 10:00 AM  Dressing Changed Changed 03/02/2015 10:00 AM  Dressing Status Clean;Dry;Intact 03/02/2015 10:00 AM  Dressing Change Frequency Daily 03/02/2015 10:00 AM  Site / Wound Assessment Pink;Red;Yellow  03/02/2015 10:00 AM  % Wound base Red or Granulating 75% 03/02/2015 10:00 AM  % Wound base Yellow 25% 03/02/2015 10:00 AM  % Wound base Black 0% 03/02/2015 10:00 AM  % Wound base Other (Comment) 0% 03/02/2015 10:00 AM  Peri-wound Assessment Intact 03/02/2015 10:00 AM  Wound Length (cm) 6 cm 03/01/2015 10:37 AM  Wound Width (cm) 8 cm 03/01/2015 10:37 AM  Wound Depth (cm) 3 cm 03/01/2015 10:37 AM  Tunneling (cm) 4 cm at 12 o'clock 03/01/2015 10:37 AM  Undermining (cm) 2-3cm at 9-11 o'clock 03/01/2015 10:37 AM  Margins Unattached edges (unapproximated) 03/02/2015 10:00 AM  Closure None 03/02/2015 10:00 AM  Drainage Amount Moderate 03/02/2015 10:00 AM  Drainage Description Serosanguineous;Odor 03/02/2015 10:00 AM  Treatment Debridement (Selective);Hydrotherapy (Pulse lavage);Packing (Saline gauze) 03/02/2015 10:00 AM   Santyl applied to wound  bed prior to applying dressing.   Incision (Closed) 10/12/14 Abdomen Other (Comment) (Active)     Incision (Closed) 12/08/14 Back Other (Comment) (Active)   Hydrotherapy Pulsed lavage therapy - wound location: Sacrum and Bil Ischial Pulsed Lavage with Suction (psi): 8 psi Pulsed Lavage with Suction - Normal Saline Used: 1000 mL Pulsed Lavage Tip: Tip with splash shield Selective Debridement Selective Debridement - Location: Sacral and bil ischial tuberosity wounds Selective Debridement - Tools Used: Forceps;Scissors Selective Debridement - Tissue Removed: Yellow slough; brown eschar   Wound Assessment and Plan  Wound Therapy - Assess/Plan/Recommendations Wound Therapy - Clinical Statement: Steady progress in wounds. Eschar on lt ischium beginning to loosen. Wound Therapy - Functional Problem List: Multiple wounds and pressure sores. Factors Delaying/Impairing Wound Healing: Altered sensation;Immobility;Polypharmacy Hydrotherapy Plan: Debridement;Dressing change;Patient/family education;Pulsatile lavage with suction Wound Therapy - Frequency: 6X / week Wound Therapy - Current Recommendations: Other (comment) (Continue with Plastics as outpatient.) Wound Therapy - Follow Up Recommendations: Other (comment) (Per MD orders.) Wound Plan: See above.  Wound Therapy Goals- Improve the function of patient's integumentary system by progressing the wound(s) through the phases of wound healing (inflammation - proliferation - remodeling) by: Decrease Necrotic Tissue to: 30 Decrease Necrotic Tissue - Progress: Progressing toward goal Increase Granulation Tissue to: 70 Increase Granulation Tissue - Progress: Progressing toward goal Improve Drainage Characteristics: Min Improve Drainage Characteristics - Progress: Progressing toward goal Patient/Family will be able to : pt/family to state and perform dressing changes.   Patient/Family Instruction Goal - Progress: Progressing toward goal  Goals  will be updated until maximal potential achieved or discharge criteria met.  Discharge criteria: when goals achieved, discharge from hospital, MD decision/surgical intervention, no progress towards goals, refusal/missing three consecutive treatments without notification or medical reason.  GP     Alonzo Owczarzak, Thornton Papas, [place] 03/02/2015, 10:48 AM

## 2015-03-02 NOTE — Progress Notes (Signed)
Inpatient Diabetes Program Recommendations  AACE/ADA: New Consensus Statement on Inpatient Glycemic Control (2013)  Target Ranges:  Prepandial:   less than 140 mg/dL      Peak postprandial:   less than 180 mg/dL (1-2 hours)      Critically ill patients:  140 - 180 mg/dL   Results for Jason [name], Jason [name] (MRN [id number]) as of 03/02/2015 11:06  Ref. Range 03/01/2015 08:00 03/01/2015 12:33 03/01/2015 17:47 03/01/2015 20:18 03/01/2015 23:46 03/02/2015 05:39 03/02/2015 08:14  Glucose-Capillary Latest Ref Range: 65-99 mg/dL 185 (H) 249 (H) 241 (H) 289 (H) 433 (H) 202 (H) 244 (H)   Diabetes history: DM 2 Outpatient Diabetes medications: Lantus 15 units QHS, Novolog 5 units TID Current orders for Inpatient glycemic control: Lantus 25 units Daily, Novolog 0-15 units TID, Novolog 3 units TID  Inpatient Diabetes Program Recommendations Insulin - Basal: Glucose consistently in the 200's today. Please consider increasing basal insulin to lantus 32 units QHS.  Noted: Correction increased to moderate meal coverage ordered.  Thanks, Tama Headings RN, MSN, [facility] Inpatient Diabetes Coordinator Team Pager [phone]

## 2015-03-02 NOTE — Progress Notes (Signed)
PHARMACY NOTE  Pharmacy Consult :  29 y.o. male is currently on Heparin infusion for pulmonary embolism .   Heparin Dosing Wt :  50.8 kg  LABS :  Recent Labs  02/28/15 0500 02/28/15 0625 03/01/15 0445 03/01/15 0450 03/02/15 0825 03/02/15 1800  HGB  --  8.3*  --  8.6* 7.9*  --   HCT  --  26.0*  --  26.5* 25.1*  --   PLT  --  657*  --  639* 622*  --   HEPARINUNFRC 0.36  --  0.33  --  <0.10* 0.15*  CREATININE  --  0.52*  --   --   --   --     MEDICATION: Infusion[s]: Infusions:  . sodium chloride 50 mL/hr at 03/01/15 2350  . heparin 1,500 Units/hr (03/02/15 0820)     ASSESSMENT : 29 y.o. male continuing on Heparin for pulmonary embolism   Heparin infusing at 1500 units/hr,  Heparin level has been  within therapeutic window x 9 days and is undetectable this morning 5/25. Heparin running via peripheral line and HL drawn via PICC line.  Per VAS team RN who drew HL, IV was beeping and heparin drip was off.  New bag was hung at 0820 am.  Will assume HL low because drip was off. No evidence of bleeding complications observed. Hg 7.9. Heparin level this PM remains less than goal at 0.15.   GOAL :  Heparin Level  0.3 - 0.7 units/ml  PLAN : Increase IV heparin to 1600 units/hr Recheck level in 6 hrs.    Blakleigh Straw S. Alford Highland, PharmD, [facility] Clinical Staff Pharmacist Pager [phone]   03/02/2015 6:48 PM

## 2015-03-03 ENCOUNTER — Ambulatory Visit: Payer: Self-pay | Admitting: Infectious Disease

## 2015-03-03 DIAGNOSIS — L8990 Pressure ulcer of unspecified site, unspecified stage: Secondary | ICD-10-CM | POA: Insufficient documentation

## 2015-03-03 LAB — VANCOMYCIN, TROUGH: VANCOMYCIN TR: 19 ug/mL (ref 10.0–20.0)

## 2015-03-03 LAB — CBC
HEMATOCRIT: 25.5 % — AB (ref 39.0–52.0)
Hemoglobin: 8.1 g/dL — ABNORMAL LOW (ref 13.0–17.0)
MCH: 26 pg (ref 26.0–34.0)
MCHC: 31.8 g/dL (ref 30.0–36.0)
MCV: 82 fL (ref 78.0–100.0)
Platelets: 661 10*3/uL — ABNORMAL HIGH (ref 150–400)
RBC: 3.11 MIL/uL — ABNORMAL LOW (ref 4.22–5.81)
RDW: 18 % — AB (ref 11.5–15.5)
WBC: 16 10*3/uL — ABNORMAL HIGH (ref 4.0–10.5)

## 2015-03-03 LAB — GLUCOSE, CAPILLARY
Glucose-Capillary: 101 mg/dL — ABNORMAL HIGH (ref 65–99)
Glucose-Capillary: 152 mg/dL — ABNORMAL HIGH (ref 65–99)
Glucose-Capillary: 185 mg/dL — ABNORMAL HIGH (ref 65–99)
Glucose-Capillary: 265 mg/dL — ABNORMAL HIGH (ref 65–99)
Glucose-Capillary: 301 mg/dL — ABNORMAL HIGH (ref 65–99)
Glucose-Capillary: 72 mg/dL (ref 65–99)

## 2015-03-03 LAB — HEPARIN LEVEL (UNFRACTIONATED)
Heparin Unfractionated: 0.24 IU/mL — ABNORMAL LOW (ref 0.30–0.70)
Heparin Unfractionated: 0.33 IU/mL (ref 0.30–0.70)

## 2015-03-03 MED ORDER — INSULIN ASPART 100 UNIT/ML ~~LOC~~ SOLN
4.0000 [IU] | Freq: Once | SUBCUTANEOUS | Status: AC
  Administered 2015-03-03: 4 [IU] via SUBCUTANEOUS

## 2015-03-03 MED ORDER — INSULIN GLARGINE 100 UNIT/ML ~~LOC~~ SOLN
32.0000 [IU] | Freq: Every day | SUBCUTANEOUS | Status: DC
  Administered 2015-03-03 – 2015-03-06 (×4): 32 [IU] via SUBCUTANEOUS
  Filled 2015-03-03 (×4): qty 0.32

## 2015-03-03 MED ORDER — APIXABAN 5 MG PO TABS
10.0000 mg | ORAL_TABLET | Freq: Two times a day (BID) | ORAL | Status: DC
  Administered 2015-03-03 – 2015-03-06 (×6): 10 mg via ORAL
  Filled 2015-03-03 (×7): qty 2

## 2015-03-03 MED ORDER — APIXABAN 5 MG PO TABS: 5.0000 mg | ORAL_TABLET | Freq: Two times a day (BID) | ORAL | Status: DC

## 2015-03-03 MED ORDER — RIVAROXABAN 15 MG PO TABS
15.0000 mg | ORAL_TABLET | Freq: Two times a day (BID) | ORAL | Status: DC
  Filled 2015-03-03 (×2): qty 1

## 2015-03-03 NOTE — Progress Notes (Signed)
Occupational Therapy Treatment Patient Details Name: RAYN ENDERSON MRN: [id number] DOB: [birth year] Today's Date: 03/03/2015    History of present illness is a 29 y.o. male with hx of asthma, MRSA infection, seizures, spinal stroke in 4/15, PNA, UTI, ischial osteomyelitis, diabetes mellitus, GERD, who presents with sacral decubitus infection, and anemia, and mild dka    OT comments  Focused session on feeding AE as pt getting ready to eat and interested in AE education. Issued longer yellow theraband but did not go through exercises due to pt having his lunch. Issued printed out information on additional AE for feeding that pt and family may find helpful. Issued a utensil holder and pt was able to tighten it with D ring himself once placed on L hand. Will continue to monitor self feeding and AE to determine any adaptations or additional AE needed.    Follow Up Recommendations  Home health OT;Supervision/Assistance - 24 hour    Equipment Recommendations  None recommended by OT    Recommendations for Other Services      Precautions / Restrictions Precautions Precaution Comments: ischeal and sacral decubitii,        Mobility Bed Mobility                  Transfers                      Balance                                   ADL                                         General ADL Comments: Issued longer level 1 theraband so that pt can better wrap the band around his hand to perform exercises. Pt curerntly getting ready to eat lunch so exercises not performed this session but pt and mother grateful for the longer band. Issued utensil holder for pt and he was able to pull the D ring with R hand once the strap was placed on L hand to tighten the strap himself. Pt stating he didnt think he needed the dorsal universal cuff as he reports he feels his wrist is stronger than before. Discussed swivel spoon option also. Gave pt and mother  handout on different AE companies for additional dorsal wrist universal cuffs that are made from different softer material that may help with the utensil not slipping out if he does decide he needs a dorsal support again. Pt stating one problem is that the utensil would slip out of the opening of the harder dorsal wrist support as opening had stretched over time. . Pt and mother appreciative of all information.       Vision                     Perception     Praxis      Cognition   Behavior During Therapy: [facility] for tasks assessed/performed Overall Cognitive Status: Within Functional Limits for tasks assessed                       Extremity/Trunk Assessment               Exercises     Shoulder Instructions  General Comments      Pertinent Vitals/ Pain       Pain Assessment: 0-10 Pain Score: 9  Pain Location: ischeal wounds. Pain Intervention(s): Monitored during session  Home Living                                          Prior Functioning/Environment              Frequency Min 2X/week     Progress Toward Goals  OT Goals(current goals can now be found in the care plan section)  Progress towards OT goals: Progressing toward goals     Plan Discharge plan remains appropriate    Co-evaluation                 End of Session     Activity Tolerance Patient tolerated treatment well   Patient Left in bed;with call bell/phone within reach;with family/visitor present   Nurse Communication          Time: 1338-1410 OT Time Calculation (min): 32 min  Charges: OT General Charges $OT Visit: 1 Procedure OT Treatments $Self Care/Home Management : 23-37 mins  Jules Schick  [phone] 03/03/2015, 2:34 PM

## 2015-03-03 NOTE — Progress Notes (Signed)
Nutrition Follow-up  DOCUMENTATION CODES:  Severe malnutrition in context of chronic illness, Underweight  INTERVENTION:  Glucerna shake  NUTRITION DIAGNOSIS:  Malnutrition related to chronic illness as evidenced by severe depletion of body fat, severe depletion of muscle mass, percent weight loss.  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 Pt with SOB with CT angio showing bilateral PE in lower lobes.   Pt in with MD at time of visit.   Pt continues with hydrotherapy treatments for multiple wounds.   Palliative care following. Pt and mother very hopeful despite pt's grim prognosis.   Appetite continues to improve. Noted 75-100% meal completion. Pt is also consuming Glucerna Shakes. Will continue.  Per attending MD, plastic surgery is recommending additional hydrotherapy. Discharge disposition is to transfer to to facility for continued hydrotherapy ([facility]).   Height:  Ht Readings from Last 1 Encounters:  02/20/15 5\' 8"  (1.727 m)    Weight:  Wt Readings from Last 1 Encounters:  02/20/15 111 lb 15.9 oz (50.8 kg)    Ideal Body Weight:  70 kg  Wt Readings from Last 10 Encounters:  02/20/15 111 lb 15.9 oz (50.8 kg)  02/16/15 112 lb (50.803 kg)  01/12/15 128 lb 4.9 oz (58.2 kg)  12/19/14 130 lb 1.1 oz (59 kg)  11/25/14 155 lb (70.308 kg)  11/19/14 155 lb 13.8 oz (70.7 kg)  10/02/14 125 lb 10.6 oz (57 kg)  09/23/14 131 lb 1.6 oz (59.467 kg)  08/13/14 129 lb 6.6 oz (58.7 kg)  08/03/14 139 lb (63.05 kg)    BMI:  Body mass index is 17.03 kg/(m^2).  Estimated Nutritional Needs:  Kcal:  2000-2200  Protein:  100-110 grams  Fluid:  2 -2.2 L/day  Skin:  Wound (see  comment) ( Stage IV on sacrum, hip, Stage II on back, Unstageable on s)  Diet Order:  Diet Carb Modified Fluid consistency:: Thin; Room service appropriate?: Yes  EDUCATION NEEDS:  Education needs addressed   Intake/Output Summary (Last 24 hours) at 03/03/15 1726 Last data filed at 03/03/15 1218  Gross per 24 hour  Intake 1165.83 ml  Output   4150 ml  Net -2984.17 ml    Last BM:  03/03/15  Moksh Loomer [name], RD, LDN, CDE Pager: [phone] After hours Pager: [phone]

## 2015-03-03 NOTE — Progress Notes (Signed)
Physical medicine rehabilitation consult requested chart again reviewed. Again as noted from rehabilitation admissions coordinator know 03/01/2015 and also patient well known to Dr.Swartz at the outpatient clinic. Cannot justify inpatient rehabilitation services for this patient for this particular diagnosis. Again as noted by therapy team recommendations are home health versus skilled nursing facility. Hold on formal rehabilitation consult at this time

## 2015-03-03 NOTE — Progress Notes (Signed)
Physical Therapy Wound Treatment Patient Details  Name: Jason [name] MRN: [id number] Date of Birth: Feb 08, 1986  Today's Date: 03/03/2015 Time: 1028-1058 Time Calculation (min): 30 min  Subjective  Subjective: No c/o's Patient and Family Stated Goals: Wounds to heal.  Date of Onset:  (pt unsure exact onset, but states "months".) Prior Treatments: I+D by plastics and dressing changes with Santyl.    Pain Score: Pain Score:Pt premedicated.  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 (Active)  Dressing Type ABD;Barrier Film (skin prep);Gauze (Comment);Tape dressing;Moist to dry 03/03/2015 11:21 AM  Dressing Changed 03/03/2015 11:21 AM  Dressing Change Frequency Twice a day 03/03/2015 11:21 AM  State of Healing Early/partial granulation 03/03/2015 11:21 AM  Site / Wound Assessment Yellow;Pink;Red 03/03/2015 11:21 AM  % Wound base Red or Granulating 70% 03/03/2015 11:21 AM  % Wound base Yellow 30% 03/03/2015 11:21 AM  % Wound base Black 0% 03/03/2015 11:21 AM  % Wound base Other (Comment) 0% 03/03/2015 11:21 AM  Peri-wound Assessment Intact 03/03/2015 11:21 AM  Wound Length (cm) 9.9 cm 02/25/2015  2:00 PM  Wound Width (cm) 8.6 cm 02/25/2015  2:00 PM  Wound Depth (cm) 2.9 cm 02/25/2015  2:00 PM  Undermining (cm) Undermines from 10:00 to 3:00 and deepest portion is 2.7cm. 02/25/2015  2:00 PM  Margins Unattached edges (unapproximated) 03/03/2015 11:21 AM  Drainage Amount Minimal 03/03/2015 11:21 AM  Drainage Description Serosanguineous 03/03/2015 11:21 AM  Treatment Debridement (Selective);Hydrotherapy (Pulse lavage);Packing (Saline gauze) 03/03/2015 11:21 AM   Santyl applied to wound bed prior to applying dressing.   Pressure Ulcer 02/20/15 Unstageable - Full thickness tissue loss in which the base of the ulcer is covered by slough (yellow, tan, gray, green or brown) and/or eschar (tan, brown or black) in the wound bed. (Active)   Dressing Type Barrier Film (skin prep);Gauze (Comment);Moist to dry;Tape dressing;ABD 03/03/2015 11:21 AM  Dressing Changed 03/03/2015 11:21 AM  Dressing Change Frequency Daily 03/03/2015 11:21 AM  State of Healing Eschar 03/03/2015 11:21 AM  Site / Wound Assessment Brown;Pink;Yellow;Black 03/03/2015 11:21 AM  % Wound base Red or Granulating 5% 03/03/2015 11:21 AM  % Wound base Yellow 75% 03/03/2015 11:21 AM  % Wound base Black 20% 03/03/2015 11:21 AM  % Wound base Other (Comment) 0% 03/03/2015 11:21 AM  Peri-wound Assessment Intact 03/03/2015 11:21 AM  Wound Length (cm) 6 cm 03/02/2015 10:00 AM  Wound Width (cm) 6.4 cm 03/02/2015 10:00 AM  Wound Depth (cm) 0 cm 03/02/2015 10:00 AM  Undermining (cm) Undermines 12:00 to 4:00 with deepest portion 3.9cm.   02/25/2015  2:00 PM  Margins Unattached edges (unapproximated) 03/03/2015 11:21 AM  Drainage Amount Minimal 03/03/2015 11:21 AM  Drainage Description Serosanguineous 03/03/2015 11:21 AM  Treatment Debridement (Selective);Packing (Saline gauze);Hydrotherapy (Pulse lavage) 03/03/2015 11:21 AM   Santyl applied to wound bed prior to applying dressing.   Wound / Incision (Open or Dehisced) 02/25/15 Incision - Open Ischial tuberosity Right Right Ischium (Active)  Dressing Type Barrier Film (skin prep);Gauze (Comment);Moist to dry;Tape dressing;ABD 03/03/2015 11:21 AM  Dressing Changed Changed 03/03/2015 11:21 AM  Dressing Status Clean;Dry;Intact 03/03/2015 11:21 AM  Dressing Change Frequency Daily 03/03/2015 11:21 AM  Site / Wound Assessment Pink;Red;Yellow 03/03/2015 11:21 AM  % Wound base Red or Granulating 75% 03/03/2015 11:21 AM  % Wound base Yellow 25% 03/03/2015 11:21 AM  % Wound base Black 0% 03/03/2015 11:21 AM  % Wound base Other (Comment) 0% 03/03/2015 11:21 AM  Peri-wound Assessment Intact 03/03/2015 11:21 AM  Wound Length (cm) 6 cm 03/01/2015 10:37 AM  Wound Width (cm) 8 cm 03/01/2015 10:37 AM  Wound Depth (cm) 3 cm 03/01/2015 10:37 AM  Tunneling (cm) 4 cm  at 12 o'clock 03/01/2015 10:37 AM  Undermining (cm) 2-3cm at 9-11 o'clock 03/01/2015 10:37 AM  Margins Unattached edges (unapproximated) 03/03/2015 11:21 AM  Closure None 03/03/2015 11:21 AM  Drainage Amount Moderate 03/03/2015 11:21 AM  Drainage Description Serosanguineous;Odor 03/03/2015 11:21 AM  Treatment Debridement (Selective);Hydrotherapy (Pulse lavage);Packing (Saline gauze) 03/02/2015 10:00 AM   Santyl applied to wound bed prior to applying dressing.    Hydrotherapy Pulsed lavage therapy - wound location: Sacrum and Bil Ischial Pulsed Lavage with Suction (psi): 8 psi Pulsed Lavage with Suction - Normal Saline Used: 1000 mL Pulsed Lavage Tip: Tip with splash shield Selective Debridement Selective Debridement - Location: Sacral and bil ischial tuberosity wounds Selective Debridement - Tools Used: Scalpel;Forceps Selective Debridement - Tissue Removed: Yellow slough; brown eschar   Wound Assessment and Plan  Wound Therapy - Assess/Plan/Recommendations Wound Therapy - Clinical Statement: Steady progress. Removal of eschar from lt hip progressing. Wound Therapy - Functional Problem List: Multiple wounds and pressure sores. Factors Delaying/Impairing Wound Healing: Altered sensation;Immobility;Polypharmacy Hydrotherapy Plan: Debridement;Dressing change;Patient/family education;Pulsatile lavage with suction Wound Therapy - Frequency: 6X / week Wound Therapy - Current Recommendations: Other (comment) (Continue with Plastics as outpatient.) Wound Therapy - Follow Up Recommendations: Other (comment) (Per MD orders.) Wound Plan: See above.  Wound Therapy Goals- Improve the function of patient's integumentary system by progressing the wound(s) through the phases of wound healing (inflammation - proliferation - remodeling) by: Decrease Necrotic Tissue to: 30 Decrease Necrotic Tissue - Progress: Progressing toward goal Increase Granulation Tissue to: 70 Increase Granulation Tissue - Progress:  Progressing toward goal Improve Drainage Characteristics: Min Improve Drainage Characteristics - Progress: Met Patient/Family will be able to : pt/family to state and perform dressing changes.   Patient/Family Instruction Goal - Progress: Progressing toward goal  Goals will be updated until maximal potential achieved or discharge criteria met.  Discharge criteria: when goals achieved, discharge from hospital, MD decision/surgical intervention, no progress towards goals, refusal/missing three consecutive treatments without notification or medical reason.  GP     Aundraya Dripps 03/03/2015, 11:37 AM Suanne Marker PT [phone]

## 2015-03-03 NOTE — Progress Notes (Addendum)
ANTICOAGULATION + ANTIMICROBIAL CONSULT NOTE  Pharmacy Consult for heparin>apixaban, vancomycin Indication: pulmonary embolus, osteomyelitis   Allergies  Allergen Reactions  . Cefuroxime Axetil Anaphylaxis  . Ertapenem Other (See Comments)    Rash and confusion  . Morphine And Related Other (See Comments)    Changed mental status, confusion, headache, visual hallucination  . Penicillins Anaphylaxis and Other (See Comments)    ?can take amoxicillin?  . Sulfa Antibiotics Anaphylaxis, Shortness Of Breath and Other (See Comments)  . Tessalon [Benzonatate] Anaphylaxis  . Shellfish Allergy Itching and Other (See Comments)    Took benadryl to alleviate reaction  . Miripirium Rash and Other (See Comments)    Change in mental status    Patient Measurements: Height: 5\' 8"  (172.7 cm) Weight: 111 lb 15.9 oz (50.8 kg) IBW/kg (Calculated) : 68.4 Heparin Dosing Weight:  Vital Signs: Temp: 98.5 F (36.9 C) (05/26 1023) Temp Source: Oral (05/26 1023) BP: 117/77 mmHg (05/26 1023) Pulse Rate: 97 (05/26 1023)  Labs:  Recent Labs  03/01/15 0450 03/02/15 0825 03/02/15 1800 03/03/15 0303 03/03/15 0310 03/03/15 1220  HGB 8.6* 7.9*  --   --  8.1*  --   HCT 26.5* 25.1*  --   --  25.5*  --   PLT 639* 622*  --   --  661*  --   HEPARINUNFRC  --  <0.10* 0.15* 0.24*  --  0.33    Estimated Creatinine Clearance: 97 mL/min (by C-G formula based on Cr of 0.52).   Medical History: Past Medical History  Diagnosis Date  . GERD (gastroesophageal reflux disease)   . Asthma   . Hx MRSA infection     on face  . Gastroparesis   . Diabetic neuropathy   . Seizures   . Family history of anesthesia complication     Pt mother can't have epidural procedures  . Dysrhythmia   . Pneumonia   . Arthritis   . Fibromyalgia   . 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 Type I diabetes mellitus     sees Dr. Loanne Drilling   . 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  Anticoagulation:currently on heparin for multiple PEs, levels have been fluctuating over the past few days.  Noted to have had bloody drainage from sacral ulcer. New orders received this afternoon to transition patient over to apixaban. Will stop heparin once dose of apixaban is given.  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  Last vancomycin trough drawn on 5/20 was therapeutic while on 750/12h. Wound culture from 5/16 with multiple organisms, but none predominanting. Scr and UOP good.  VT 5/26  19 - within therapeutic range, continue current dose  Vanc 5/13>>5/14, Restart 5/15>> Aztreo 5/13>>5/13; Restart 5/15 >> LVQ 5/13 >> 5/14 Tigecycline 5/14 X 1 Metronidazole 5/15>>   Goal of Therapy:  Heparin level 0.3-0.7 units/ml Monitor platelets by anticoagulation protocol: Yes  Vancomycin trough: 15-20 mcg/ml   Pla:  - apixaban 10mg  bid for 7d then 5mg  bid - Continue Vanc 750 q 12h - Continue Metronidazole 500 mg po q8h - Continue Aztreonam 2g IV q8h   Erin Hearing PharmD., BCPS Clinical Pharmacist Pager [phone] 03/03/2015 4:59 PM

## 2015-03-03 NOTE — Progress Notes (Signed)
ANTICOAGULATION + ANTIMICROBIAL CONSULT NOTE  Pharmacy Consult for heparin, vancomycin Indication: pulmonary embolus, osteomyelitis   Allergies  Allergen Reactions  . Cefuroxime Axetil Anaphylaxis  . Ertapenem Other (See Comments)    Rash and confusion  . Morphine And Related Other (See Comments)    Changed mental status, confusion, headache, visual hallucination  . Penicillins Anaphylaxis and Other (See Comments)    ?can take amoxicillin?  . Sulfa Antibiotics Anaphylaxis, Shortness Of Breath and Other (See Comments)  . Tessalon [Benzonatate] Anaphylaxis  . Shellfish Allergy Itching and Other (See Comments)    Took benadryl to alleviate reaction  . Miripirium Rash and Other (See Comments)    Change in mental status    Patient Measurements: Height: 5\' 8"  (172.7 cm) Weight: 111 lb 15.9 oz (50.8 kg) IBW/kg (Calculated) : 68.4 Heparin Dosing Weight:  Vital Signs: Temp: 98.5 F (36.9 C) (05/26 1023) Temp Source: Oral (05/26 1023) BP: 117/77 mmHg (05/26 1023) Pulse Rate: 97 (05/26 1023)  Labs:  Recent Labs  03/01/15 0450 03/02/15 0825 03/02/15 1800 03/03/15 0303 03/03/15 0310 03/03/15 1220  HGB 8.6* 7.9*  --   --  8.1*  --   HCT 26.5* 25.1*  --   --  25.5*  --   PLT 639* 622*  --   --  661*  --   HEPARINUNFRC  --  <0.10* 0.15* 0.24*  --  0.33    Estimated Creatinine Clearance: 97 mL/min (by C-G formula based on Cr of 0.52).   Medical History: Past Medical History  Diagnosis Date  . GERD (gastroesophageal reflux disease)   . Asthma   . Hx MRSA infection     on face  . Gastroparesis   . Diabetic neuropathy   . Seizures   . Family history of anesthesia complication     Pt mother can't have epidural procedures  . Dysrhythmia   . Pneumonia   . Arthritis   . Fibromyalgia   . 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 Type I diabetes mellitus     sees Dr. Loanne Drilling   . Syncope 02/16/2015    Medications:  Prescriptions prior to admission   Medication Sig Dispense Refill Last Dose  . B Complex-Folic Acid (B COMPLEX-VITAMIN B12 PO) Take 1 tablet by mouth at bedtime.   02/17/2015  . fentaNYL (DURAGESIC - DOSED MCG/HR) 25 MCG/HR patch Place 1 patch (25 mcg total) onto the skin every 3 (three) days. 10 patch 0 02/20/2015 at Unknown time  . ibuprofen (ADVIL,MOTRIN) 200 MG tablet Take 400 mg by mouth every 4 (four) hours as needed for fever.   02/18/2015  . lidocaine (LMX) 4 % cream Apply 1 application topically daily as needed (gum pain).   Past Week at Unknown time  . omeprazole (PRILOSEC) 40 MG capsule Take 40 mg by mouth daily.   02/17/2015  . acetaminophen (TYLENOL) 325 MG tablet Take 2 tablets (650 mg total) by mouth every 6 (six) hours as needed for mild pain, moderate pain, fever or headache.   6 weekks  . albuterol (PROVENTIL) (2.5 MG/3ML) 0.083% nebulizer solution Take 3 mLs (2.5 mg total) by nebulization every 4 (four) hours as needed for wheezing or shortness of breath. 75 mL 3 2 weeks  . baclofen (LIORESAL) 10 MG tablet Take 1-2 tablets (10-20 mg total) by mouth 3 (three) times daily. Takes 10mg  in morning and lunchtime and takes 20mg  at bedtime 30 each 0 02/18/2015  . collagenase (SANTYL) ointment Apply 1  application topically daily. 90 g 5 02/18/2015  . dexlansoprazole (DEXILANT) 60 MG capsule Take 1 capsule (60 mg total) by mouth daily. (Patient not taking: Reported on 02/20/2015) 30 capsule 0 Not Taking at Unknown time  . diclofenac sodium (VOLTAREN) 1 % GEL Apply 2 g topically daily as needed (for pain). (Patient taking differently: Apply 2 g topically 2 (two) times daily as needed (for pain). ) 100 g 0 02/17/2015  . feeding supplement, GLUCERNA SHAKE, (GLUCERNA SHAKE) LIQD Take 237 mLs by mouth 3 (three) times daily between meals. 30 Can 0 02/18/2015  . fluconazole (DIFLUCAN) 200 MG tablet Take 1 tablet (200 mg total) by mouth daily. (Patient not taking: Reported on 02/20/2015) 14 tablet 0 Not Taking at Unknown time  . glucagon  (GLUCAGON EMERGENCY) 1 MG injection Inject 1 mg into the vein once as needed. 1 each 12 6 months  . glucose blood (ACCU-CHEK SMARTVIEW) test strip Use to check blood sugar 4 times daily. (Patient not taking: Reported on 02/16/2015) 400 each 3 Not Taking  . insulin aspart (NOVOLOG) 100 UNIT/ML injection Inject 5 Units into the skin 3 (three) times daily with meals. 10 mL 11 02/18/2015  . insulin glargine (LANTUS) 100 UNIT/ML injection Inject 0.15 mLs (15 Units total) into the skin at bedtime. 10 mL 11 02/17/2015  . lidocaine (XYLOCAINE) 2 % jelly Apply 1 application topically 4 (four) times daily as needed (pain).   11 02/17/2015  . loratadine (CLARITIN) 10 MG tablet Take 10 mg by mouth at bedtime.    02/17/2015  . midodrine (PROAMATINE) 5 MG tablet Take 2 tablets (10 mg total) by mouth 2 (two) times daily with a meal. (Patient taking differently: Take 15 mg by mouth 3 (three) times daily with meals. ) 120 tablet 11 02/18/2015  . ondansetron (ZOFRAN ODT) 8 MG disintegrating tablet Take 1 tablet (8 mg total) by mouth every 8 (eight) hours as needed for nausea or vomiting. 60 tablet 5 02/18/2015  . oxyCODONE-acetaminophen (PERCOCET) 10-325 MG per tablet Take 1 tablet by mouth every 8 (eight) hours. (Patient taking differently: Take 1 tablet by mouth every 8 (eight) hours as needed for pain. ) 90 tablet 0 02/18/2015  . potassium chloride SA (KLOR-CON M20) 20 MEQ tablet Take 1 tablet (20 mEq total) by mouth daily. 90 tablet 3 02/17/2015  . pregabalin (LYRICA) 100 MG capsule Take 1-2 capsules (100-200 mg total) by mouth 4 (four) times daily. Take 100 mg by mouth in the morning, take 100 mg by mouth in the afternoon, take 100 mg by mouth in the evening and take 200 mg by mouth at bedtime. (Patient taking differently: Take 100-200 mg by mouth 4 (four) times daily -  with meals and at bedtime. 100 mg TID with meals and 200 mg at bedtime.) 150 capsule 4 02/18/2015  . pregabalin (LYRICA) 200 MG capsule Take 1 capsule (200 mg  total) by mouth at bedtime. (Patient not taking: Reported on 02/20/2015) 30 capsule 0 Not Taking at Unknown time  . QUEtiapine (SEROQUEL) 25 MG tablet Take 1 tablet (25 mg total) by mouth 3 (three) times daily. (Patient not taking: Reported on 02/20/2015) 90 tablet 0 Not Taking at Unknown time  . sodium chloride (OCEAN) 0.65 % SOLN nasal spray Place 1 spray into both nostrils as needed for congestion.   6 weeks  . tigecycline 50 mg in sodium chloride 0.9 % 100 mL Inject 50 mg into the vein every 12 (twelve) hours. (Patient not taking: Reported on 02/20/2015) 28  each 0 Not Taking at Unknown time    Assessment: 29 yo male with spinal cord infarct/quadriplegic on heparin for multiple PE, levels have been fluctuating over the past few days.  Noted to have had bloody drainage from sacral ulcer.  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  Last vancomycin trough drawn on 5/20 was therapeutic while on 750/12h. Wound culture from 5/16 with multiple organisms, but none predominanting. Scr and UOP good.  Vanc 5/13>>5/14, Restart 5/15>> Aztreo 5/13>>5/13; Restart 5/15 >> LVQ 5/13 >> 5/14 Tigecycline 5/14 X 1 Metronidazole 5/15>>   Goal of Therapy:  Heparin level 0.3-0.7 units/ml Monitor platelets by anticoagulation protocol: Yes  Vancomycin trough: 15-20 mcg/ml   Plan:  - Continue heparin at 1700 unit/hr  - Daily HL, CBC, monitor for signs of bleeding - F/u plan to transition to Eliquis - Continue Vanc 750 q 12h - Continue Metronidazole 500 mg po q8h - Continue Aztreonam 2g IV q8h - Check HL and VT this evening    Hughes Better, PharmD, BCPS Clinical Pharmacist Pager: [phone] 03/03/2015 3:00 PM

## 2015-03-03 NOTE — Progress Notes (Signed)
PHARMACY NOTE  Pharmacy Consult :  29 y.o. male is currently on Heparin infusion for pulmonary embolism .   Heparin Dosing Wt :  50.8 kg  LABS :  Recent Labs  02/28/15 0500 02/28/15 0625 03/01/15 0445 03/01/15 0450 03/02/15 0825 03/02/15 1800 03/03/15 0303 03/03/15 0310  HGB  --  8.3*  --  8.6* 7.9*  --   --  8.1*  HCT  --  26.0*  --  26.5* 25.1*  --   --  25.5*  PLT  --  657*  --  639* 622*  --   --  661*  HEPARINUNFRC 0.36  --  0.33  --  <0.10* 0.15* 0.24*  --   CREATININE  --  0.52*  --   --   --   --   --   --     MEDICATION: Infusion[s]: Infusions:  . sodium chloride 50 mL/hr at 03/01/15 2350  . heparin 1,600 Units/hr (03/03/15 0033)     ASSESSMENT : 29 y.o. male continuing on Heparin for pulmonary embolism   Heparin infusing at 1500 units/hr,  Heparin level has been  within therapeutic window x 9 days and is undetectable this morning 5/25. Heparin running via peripheral line and HL drawn via PICC line.  Per VAS team RN who drew HL, IV was beeping and heparin drip was off.  New bag was hung at 0820 am.  Will assume HL low because drip was off. No evidence of bleeding complications observed. Hg 7.9. Heparin level this remains less than goal at 0.24.  GOAL : Heparin Level  0.3 - 0.7 units/ml  PLAN : Increase IV heparin to 1700 units/hr Recheck level in 6h Daily HL/CBC  Andrey Cota. Diona Foley, PharmD Clinical Pharmacist Pager [phone] 03/03/2015 4:14 AM

## 2015-03-03 NOTE — Progress Notes (Signed)
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 Tommy Medal and labs showed leukocytosis. Pt was sent to ER for evaluation . Pt has been afebrile, Notes that he has a lot of bloody drainage fro his wounds. Patient started on IV antibiotics (plan is for prolonged therapy due to osteomyelitis). Seen by Dr. Migdalia Dk who has recommended hydrotherapy and no further debridement. Patient has developed DVT and PE; on heparin currently and with future plans to use eliquis. Needs re-evaluation by [facility] on Friday or so to decide further needs on his wound (if further hydrotherapy needed will need SNF for it); if ok to pursuit outpatient wound care on;y; transitioned to eliquis and discharge home with [facility] services.  Assessment/Plan: Active Problems:   Osteomyelitis   Anemia   DKA (diabetic ketoacidoses)   Infected wound   Bilateral pulmonary embolism   Palliative care encounter   Pulmonary embolus   Insomnia   Sacral pain   Pressure ulcer  Acute Hypoxemic respiratory failure -CT angiogram shows Acute pulmonary embolus involving bilateral lower lobes with extensive consolidation of the bilateral lower lobes as described. The consolidations are most likely impart due to pulmonary infarction but underlying pneumonia is not excluded. 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 Johnnye Sima on board and guiding [name] on antibiotic choice and length  -Last  debridement. 12/08/14 by Dr. Migdalia Dk, she has been consulted during this admission and per discussion with her; plan is for hydrotherapy at this moment (started on 5/20) -Wound care consultation completed, see note from 5/16 -Continue Air mattress -Currently on Aztreonam, Vancomycin, metronidazole -day #13 as per ID rec's  Chronic hypotension: Continue midodrine, dose increased during this admission to help maintaining BP. -will monitor VS  Hyponatremia, sodium has improved after sugar control and IVF's -patient was not eating much at home -advise to keep himself well hydrated and to maintain good PO intake  Severe Protein calorie malnutrition: will follow rec's from dietitian and continue feeding supplements   Chronic anemia: -Without evidence of acute GI bleeding. But, with high concerns of chronic bleeding from decubitus ulcer -Had 1 unit of PRBC on 3/8.  -Patient received 2 units on 5/14 -will follow Hgb trend -Transfuse for active bleeding or hemoglobin less than 7  Uncontrolled DM 1: Mild DKA with metabolic acidosis on admission- -patient acidosis and mild DKA now resolved -A1C 8.1 -discussed with mother and patient regarding importance on sugar control and medication compliance -will monitor and continue adjusting hypoglycemic regimen as needed; CBG's elevated -will continue SSI (moderate), lantus 25 units and meal coverage just 3 units   Neurogenic bladder:  -Had Klebsiella and BURKHOLDERIA UTI on previous admissions; no dysuria -will monitor -currently on broad spectrum antibiotics  Spinal cord infarction and quadriplegia: -Patient awake alert and oriented 3. Reports pain is much better -complaining of some neuropathy and unrelieved pain (which he has hx of, especially with diabetes and spinal cord injury) -will continue Lyrica and baclofen; last one dose adjusted to 20mg  TID (as per home medication  orders according to mother) -will adjust fentanyl patch, continue  oxycodone for breakthrough and add tylenol to help with pain  Insomnia: will continue Remeron (helping with sleep and appetite)  S/p Diverting Colostomy and suprapubic catheter  Reactive thrombocytosis: on heparin drip.  Code Status: full Family Communication: discussed with mother at bedside Disposition Plan: Pending disposition decision by patient and mother  Consultants:  Infectious disease  Palliative Care  Procedures:  None  Antibiotics: Aztreonam, vancomycin and flagyl  5/13>>  HPI/Subjective: Patient has no new complaints today  Objective: Filed Vitals:   03/02/15 2152 03/03/15 0211 03/03/15 0519 03/03/15 1023  BP: 124/79 112/75 107/67 117/77  Pulse: 109 105 96 97  Temp: 99.1 F (37.3 C) 98.4 F (36.9 C) 97.3 F (36.3 C) 98.5 F (36.9 C)  TempSrc: Oral Oral Oral Oral  Resp: 18 18 18 18   Height:      Weight:      SpO2: 100% 100% 100% 98%    Intake/Output Summary (Last 24 hours) at 03/03/15 1627 Last data filed at 03/03/15 1218  Gross per 24 hour  Intake 1165.83 ml  Output   4150 ml  Net -2984.17 ml    Exam: Gen: Still Complaining of pain in his back (but endorses that new regimen is controlling pain better); better appetite and drinking all feeding supplements. No fever Lungs: Clear to auscultation bilaterally without wheezes or crackles; good air movement Cardiovascular: Regular rate and rhythm without murmur gallop or rub normal S1 and S2 Abdomen: Nontender, nondistended, soft, bowel sounds positive, no rebound, no ascites, no appreciable mass Extremities: No significant cyanosis, clubbing, or edema bilateral lower extremities Neurological:alert to place and person; + quadriplegia  Skin: Multiple deep wounds to right buttock and perineal region. Large pressure wound to mid back. Wounds w mild, malodorous, yellowish-green drainage. No surrounding cellulitis appreciated. No fluctuance/abscess noted. No crepitus. Some necrotic tissue seen.   Data  Reviewed: Basic Metabolic Panel:  Recent Labs Lab 02/25/15 0452 02/28/15 0625  NA 133* 134*  K 3.5 3.9  CL 110 111  CO2 20* 20*  GLUCOSE 281* 171*  BUN 7 10  CREATININE 0.49* 0.52*  CALCIUM 8.6* 8.3*    Liver Function Tests: No results for input(s): AST, ALT, ALKPHOS, BILITOT, PROT, ALBUMIN in the last 168 hours. CBC:  Recent Labs Lab 02/27/15 0642 02/28/15 0625 03/01/15 0450 03/02/15 0825 03/03/15 0310  WBC 16.0* 14.4* 14.2* 13.8* 16.0*  HGB 8.4* 8.3* 8.6* 7.9* 8.1*  HCT 26.4* 26.0* 26.5* 25.1* 25.5*  MCV 81.2 81.5 81.8 81.5 82.0  PLT 674* 657* 639* 622* 661*   CBG:  Recent Labs Lab 03/02/15 1652 03/02/15 2149 03/03/15 0015 03/03/15 0843 03/03/15 1325  GLUCAP 137* 147* 301* 152* 185*    No results found for this or any previous visit (from the past 240 hour(s)).   Studies: Ct Angio Chest Pe W/cm &/or Wo Cm  02/20/2015   CLINICAL DATA:  Acute respiratory failure, hypoxia, shortness of breath, chest pain.  EXAM: CT ANGIOGRAPHY CHEST WITH CONTRAST  TECHNIQUE: Multidetector CT imaging of the chest was performed using the standard protocol during bolus administration of intravenous contrast. Multiplanar CT image reconstructions and MIPs were obtained to evaluate the vascular anatomy.  CONTRAST:  57mL OMNIPAQUE IOHEXOL 350 MG/ML SOLN  COMPARISON:  January 11, 2015  FINDINGS: There is acute pulmonary embolus in segmental and subsegmental pulmonary arteries of the left lower lobe. There is acute pulmonary embolus in subsegmental pulmonary arteries of the right lower lobe. There are  extensive consolidation of the right lower lobe and the medial aspect of the left lower lobe. There are small bilateral pleural effusions. There are lymphadenopathy in the mediastinum. The aorta is normal. There is small pericardial effusion. The visualized upper abdominal structures are normal.  Review of the MIP images confirms the above findings.  IMPRESSION: Acute pulmonary embolus involving  bilateral lower lobes with extensive consolidation of the bilateral lower lobes as described. The consolidations are most likely impart due to pulmonary infarction but underlying pneumonia is not excluded.  Critical Value/emergent results were called by telephone at the time of interpretation on 02/20/2015 at 8:33 am to patient's nurse Clarisa Fling who verbally acknowledged these results.   Electronically Signed   By: Abelardo Diesel M.D.   On: 02/20/2015 08:40   Mr Lumbar Spine Wo Contrast  02/19/2015   CLINICAL DATA:  Infected sacral decubitus ulcers. Fever. Leukocytosis.  EXAM: MRI LUMBAR SPINE WITHOUT CONTRAST, MRI OF THE SACRUM WITH AND WITHOUT CONTRAST  TECHNIQUE: Multiplanar, multisequence MR imaging of the lumbar spine and sacrum was performed. Contrast was performed for the sacral imaging.  CONTRAST:  10 cc MultiHance  COMPARISON:  CT scan of the abdomen and pelvis dated 01/11/2015 and MRI of the pelvis dated 01/08/2015  FINDINGS: MRI of the lumbar spine:  Normal conus tip at L1-2. There is diffuse edema in the posterior paraspinal musculature and in the gluteal muscles. There is also atrophy of the paraspinal musculature. The patient is quadriplegic. The paraspinal soft tissues are otherwise normal.  The discs from T11-12 through L5-S1 are normal. There is low signal throughout the bones of the lumbar spine which may represent a red marrow reactivation. There is no evidence of spinal infection. No spinal or foraminal stenosis or facet arthritis.  MRI of the sacrum:  There is abnormal edema and enhancement of the right ischial tuberosity. There is a deep decubitus ulcer that extends to the ischial tuberosity and there is a small abscess extending to enhance cm laterally from the decubitus ulcer at the ischial tuberosity. This pus collection does communicate with the decubitus ulcer.  There is devitalized soft tissue superficial to the left ischial tuberosity at the site of another decubitus ulcer.  There is also devitalized soft tissue at the site of the soft tissue ulcer overlying the sacrum. There is slight edema and enhancement of the remnant of the fifth sacral segment and of the entire fourth thick sacral segment but there is no bone destruction.  No evidence of epidural infection in the sacrum.  There is abnormal subcutaneous edema throughout the pelvis with muscle atrophy.  IMPRESSION: 1. Multiple sacral and ischial tuberosity decubitus ulcers. Pus extends from the depths of the right ischial tuberosity ulcer 2.5 cm laterally. 2. Abnormal edema and enhancement of the right ischial tuberosity with loss of the cortical margin, consistent with osteomyelitis. 3. Abnormal edema and enhancement of the fourth sacral segment and of the remnant of the fifth sacral segment. This is immediately adjacent to a sacral decubitus ulcer. This is not definitive for osteomyelitis however.   Electronically Signed   By: Lorriane Shire M.D.   On: 02/19/2015 13:16   Mr Sacrum/si Joints W Wo Contrast  02/19/2015   CLINICAL DATA:  Infected sacral decubitus ulcers. Fever. Leukocytosis.  EXAM: MRI LUMBAR SPINE WITHOUT CONTRAST, MRI OF THE SACRUM WITH AND WITHOUT CONTRAST  TECHNIQUE: Multiplanar, multisequence MR imaging of the lumbar spine and sacrum was performed. Contrast was performed for the sacral imaging.  CONTRAST:  10 cc MultiHance  COMPARISON:  CT scan of the abdomen and pelvis dated 01/11/2015 and MRI of the pelvis dated 01/08/2015  FINDINGS: MRI of the lumbar spine:  Normal conus tip at L1-2. There is diffuse edema in the posterior paraspinal musculature and in the gluteal muscles. There is also atrophy of the paraspinal musculature. The patient is quadriplegic. The paraspinal soft tissues are otherwise normal.  The discs from T11-12 through L5-S1 are normal. There is low signal throughout the bones of the lumbar spine which may represent a red marrow reactivation. There is no evidence of spinal infection. No spinal  or foraminal stenosis or facet arthritis.  MRI of the sacrum:  There is abnormal edema and enhancement of the right ischial tuberosity. There is a deep decubitus ulcer that extends to the ischial tuberosity and there is a small abscess extending to enhance cm laterally from the decubitus ulcer at the ischial tuberosity. This pus collection does communicate with the decubitus ulcer.  There is devitalized soft tissue superficial to the left ischial tuberosity at the site of another decubitus ulcer. There is also devitalized soft tissue at the site of the soft tissue ulcer overlying the sacrum. There is slight edema and enhancement of the remnant of the fifth sacral segment and of the entire fourth thick sacral segment but there is no bone destruction.  No evidence of epidural infection in the sacrum.  There is abnormal subcutaneous edema throughout the pelvis with muscle atrophy.  IMPRESSION: 1. Multiple sacral and ischial tuberosity decubitus ulcers. Pus extends from the depths of the right ischial tuberosity ulcer 2.5 cm laterally. 2. Abnormal edema and enhancement of the right ischial tuberosity with loss of the cortical margin, consistent with osteomyelitis. 3. Abnormal edema and enhancement of the fourth sacral segment and of the remnant of the fifth sacral segment. This is immediately adjacent to a sacral decubitus ulcer. This is not definitive for osteomyelitis however.   Electronically Signed   By: Lorriane Shire M.D.   On: 02/19/2015 13:16   Dg Chest Port 1 View  02/20/2015   CLINICAL DATA:  Patient woke up this morning with chest pain and shortness of breath.  EXAM: PORTABLE CHEST - 1 VIEW  COMPARISON:  CT chest 01/11/2015.  Chest 01/10/2015.  FINDINGS: Borderline heart size with normal pulmonary vascularity. Patchy bilateral basilar airspace disease is improving since previous study. Small bilateral pleural effusions. Right PICC catheter tip is over the cavoatrial junction. No pneumothorax. Old fracture  deformity of the right clavicle.  IMPRESSION: Basilar airspace infiltrates are improving since previous study. Small bilateral pleural effusions.   Electronically Signed   By: Lucienne Capers M.D.   On: 02/20/2015 05:42    Scheduled Meds: . aztreonam  2 g Intravenous 3 times per day  . baclofen  20 mg Oral TID  . collagenase   Topical Daily  . feeding supplement (GLUCERNA SHAKE)  237 mL Oral TID BM  . fentaNYL  50 mcg Transdermal Q72H  . insulin aspart  0-15 Units Subcutaneous TID WC  . insulin aspart  3 Units Subcutaneous TID WC  . insulin glargine  32 Units Subcutaneous Daily  . loratadine  10 mg Oral Daily  . magnesium oxide  400 mg Oral Daily  . metoCLOPramide  5 mg Oral Daily  . metroNIDAZOLE  500 mg Oral Q8H  . midodrine  10 mg Oral TID WC  . mirtazapine  7.5 mg Oral QHS  . pantoprazole  40 mg Oral Daily  .  potassium chloride SA  20 mEq Oral Daily  . pregabalin  100 mg Oral TID WC  . pregabalin  200 mg Oral QHS  . sodium chloride  10-40 mL Intracatheter Q12H  . vancomycin  750 mg Intravenous Q12H   Continuous Infusions: . sodium chloride 50 mL/hr at 03/01/15 2350  . heparin 1,700 Units/hr (03/03/15 0425)    Active Problems:   Osteomyelitis   Anemia   DKA (diabetic ketoacidoses)   Infected wound   Bilateral pulmonary embolism   Palliative care encounter   Pulmonary embolus   Insomnia   Sacral pain   Pressure ulcer    Time spent: 35 minutes   Velvet Bathe  [facility] Pager [phone] If 7PM-7AM, please contact night-coverage at [email or web address], password [facility] 03/03/2015, 4:27 PM  LOS: 13 days

## 2015-03-03 NOTE — Progress Notes (Signed)
OT Cancellation Note  Patient Details Name: Jason [name] MRN: [id number] DOB: 1986-01-02   Cancelled Treatment:    Reason Eval/Treat Not Completed: Other (comment) Note hydrotherapy in with pt when Ot checked by. Will check on pt later time or in am.  [place], [place] 03/03/2015, 11:24 AM

## 2015-03-03 NOTE — Progress Notes (Signed)
PT Cancellation Note  Patient Details Name: HALO LASKI MRN: [id number] DOB: 07/09/1986   Cancelled Treatment:    Reason Eval/Treat Not Completed: Patient at procedure or test/unavailable  Preparing for Hydrotherapy;  Will follow up tomorrow;   Pt indicated he would like to work on [facility] mobility with a manual wheelchair;  Will ask family to bring in his own (not sure that he has a manual chair, though, I believe his chair is a tilt-in-space);  Continue to recommend follow up therapies -- if he is to dc home,  HHPT followed by Outpatient PT;  Roney [place], [place]  Acute Rehabilitation Services Pager [phone] Office [phone]    [place] 03/03/2015, 10:13 AM

## 2015-03-04 LAB — GLUCOSE, CAPILLARY
GLUCOSE-CAPILLARY: 218 mg/dL — AB (ref 65–99)
GLUCOSE-CAPILLARY: 227 mg/dL — AB (ref 65–99)
Glucose-Capillary: 232 mg/dL — ABNORMAL HIGH (ref 65–99)

## 2015-03-04 NOTE — Clinical Social Work Note (Signed)
Clinical Social Work Assessment  Patient Details  Name: Jason [name] MRN: [id number] Date of Birth: 1986/09/17  Date of referral:  03/03/15               Reason for consult:  Discharge Planning, Facility Placement                Permission sought to share information with:  Family Supports, Chartered certified accountant granted to share information::  Yes, Verbal Permission Granted  Name::     Jason [name] (mother)  Agency::  SNFs  Relationship::     Contact Information:     Housing/Transportation Living arrangements for the past 2 months:  Single Family Home Source of Information:  Patient, Parent Patient Interpreter Needed:  None Criminal Activity/Legal Involvement Pertinent to Current Situation/Hospitalization:  No - Comment as needed Significant Relationships:  Parents Lives with:  Parents Do you feel safe going back to the place where you live?  Yes Need for family participation in patient care:  Yes (Comment)  Care giving concerns:  Patient does not have any concerns about returning home. The patient's mother does have reservations about the patient returning home as she is his primary caregiver. The patient's mother is very supportive of the patient wants him to get the hydrotherapy that is recommended, so she wants him to go a facility. She does report that he needs to be close to her because the patient will be very unhappy and get depressed if his mother cannot visit him.   Social Worker assessment / plan:  CSW met with patient and mother at bedside to complete assessment. CSW is familiar with patient and patient's mother Jason [name]. CSW inquired about the patient's DC plan. The treatment team is recommending continued hydrotherapy for the patient but the patient states he is not willing to DC to a facility outside of [facility]. In fact, he says he will likely refuse placement all together. His mom agrees with placement but does request [facility] only. CSW  explained to the patient and mom that the patient is considered a difficult to place patient which makes SNF options limited. I explained that if we were able to find a placement option for the patient, it will likely be in [place] or [place] counties. The patient continues to state he does not want this. The patient states that he understands that he will not get hydrotherapy if he returns home and he is ok with this. His mom Jason [name] wants the patient to make the ultimate decision because she knows that's important to him. CSW has notified RNCM of patient refusal of placement outside of [facility] and likelihood of needing to plan for another DC home.   Employment status:  Disabled (Comment on whether or not currently receiving Disability) Insurance information:  Medicaid In [place] PT Recommendations:  Inpatient Rehab Consult Information / Referral to community resources:  [place]  Patient/Family's Response to care:  Patient and patient's mother are happy with the care received in the hospital.   Patient/Family's Understanding of and Emotional Response to Diagnosis, Current Treatment, and Prognosis:  The patient and mother have good insight into reason for admission and the patient's current medical needs. CSW suspects the patient wants to return home because this is comfortable for the patient. The patient shared, "If I'm stuck in a nursing home I'm just going to give up."  Emotional Assessment Appearance:  Appears stated age Attitude/Demeanor/Rapport:  Other (Appropriate) Affect (typically observed):  Accepting, Calm, Stable,  Appropriate Orientation:  Oriented to Self, Oriented to Place, Oriented to  Time, Oriented to Situation Alcohol / Substance use:  Tobacco Use (Quit 5 months ago.) Psych involvement (Current and /or in the community):  No (Comment)  Discharge Needs  Concerns to be addressed:  Discharge Planning Concerns, Patient refuses services Readmission within  the last 30 days:  Yes Current discharge risk:  Physical Impairment Barriers to Discharge:  No SNF bed (Hydrotherapy is recommended, but patient is not willing to be placed.)   Rigoberto Noel, LCSW 03/04/2015, 8:09 AM

## 2015-03-04 NOTE — Discharge Instructions (Signed)
Information on my medicine - ELIQUIS (apixaban)  This medication education was reviewed with me or my healthcare representative as part of my discharge preparation.  The pharmacist that spoke with me during my hospital stay was:  Jaquita Folds, [facility]  Why was Eliquis prescribed for you? Eliquis was prescribed to treat blood clots that may have been found in the veins of your legs (deep vein thrombosis) or in your lungs (pulmonary embolism) and to reduce the risk of them occurring again.  What do You need to know about Eliquis ? The starting dose is 10 mg (two 5 mg tablets) taken TWICE daily for the FIRST SEVEN (7) DAYS, then on (enter date)  03/10/15  the dose is reduced to ONE 5 mg tablet taken TWICE daily.  Eliquis may be taken with or without food.   Try to take the dose about the same time in the morning and in the evening. If you have difficulty swallowing the tablet whole please discuss with your pharmacist how to take the medication safely.  Take Eliquis exactly as prescribed and DO NOT stop taking Eliquis without talking to the doctor who prescribed the medication.  Stopping may increase your risk of developing a new blood clot.  Refill your prescription before you run out.  After discharge, you should have regular check-up appointments with your healthcare provider that is prescribing your Eliquis.    What do you do if you miss a dose? If a dose of ELIQUIS is not taken at the scheduled time, take it as soon as possible on the same day and twice-daily administration should be resumed. The dose should not be doubled to make up for a missed dose.  Important Safety Information A possible side effect of Eliquis is bleeding. You should call your healthcare provider right away if you experience any of the following: ? Bleeding from an injury or your nose that does not stop. ? Unusual colored urine (red or dark brown) or unusual colored stools (red or black). ? Unusual bruising for  unknown reasons. ? A serious fall or if you hit your head (even if there is no bleeding).  Some medicines may interact with Eliquis and might increase your risk of bleeding or clotting while on Eliquis. To help avoid this, consult your healthcare provider or pharmacist prior to using any new prescription or non-prescription medications, including herbals, vitamins, non-steroidal anti-inflammatory drugs (NSAIDs) and supplements.  This website has more information on Eliquis (apixaban): http://www.eliquis.com/eliquis/home

## 2015-03-04 NOTE — Progress Notes (Signed)
Inpatient Diabetes Program Recommendations  AACE/ADA: New Consensus Statement on Inpatient Glycemic Control (2013)  Target Ranges:  Prepandial:   less than 140 mg/dL      Peak postprandial:   less than 180 mg/dL (1-2 hours)      Critically ill patients:  140 - 180 mg/dL   Diabetes history: DM 2  Inpatient Diabetes Program Recommendations  Correction (SSI): Glucose is increased at bedtime. Please consider Novolog 0-5 units QHS for bedtime coverage.   Thanks,  Tama Headings RN, MSN, [facility] Inpatient Diabetes Coordinator Team Pager [phone]

## 2015-03-04 NOTE — Progress Notes (Signed)
Physical Therapy Wound Treatment Patient Details  Name: Jason [name] MRN: [id number] Date of Birth: [birth year]  Today's Date: 03/04/2015 Time: 9381-8299 Time Calculation (min): 48 min   Pt's wounds have made good progress with hydrotherapy. Significant necrotic tissue removed. Remaining necrotic tissue is soft. Feel pt can continue with wet to dry dressings with santyl at home. Feel he needs follow up for continued bedside debridement at wound center and/or with Dr. Migdalia Dk.   Subjective  Subjective: No c/o's Patient and Family Stated Goals: Wounds to heal.  Date of Onset:  (pt unsure exact onset, but states "months".) Prior Treatments: I+D by plastics and dressing changes with Santyl.    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 (Active)  Dressing Type ABD;Barrier Film (skin prep);Gauze (Comment);Tape dressing;Moist to dry 03/04/2015  2:00 PM  Dressing Changed 03/04/2015  2:00 PM  Dressing Change Frequency Twice a day 03/04/2015  2:00 PM  State of Healing Early/partial granulation 03/04/2015  2:00 PM  Site / Wound Assessment Yellow;Pink;Red 03/04/2015  2:00 PM  % Wound base Red or Granulating 70% 03/04/2015  2:00 PM  % Wound base Yellow 30% 03/04/2015  2:00 PM  % Wound base Black 0% 03/04/2015  2:00 PM  % Wound base Other (Comment) 0% 03/04/2015  2:00 PM  Peri-wound Assessment Intact 03/04/2015  2:00 PM  Wound Length (cm) 10 cm 03/04/2015  2:00 PM  Wound Width (cm) 8.5 cm 03/04/2015  2:00 PM  Wound Depth (cm) 3 cm 03/04/2015  2:00 PM  Undermining (cm) undermining from 10 to 3 o'clock from up to 2.5 cm 03/04/2015  2:00 PM  Margins Unattached edges (unapproximated) 03/04/2015  2:00 PM  Drainage Amount Minimal 03/04/2015  2:00 PM  Drainage Description Serosanguineous 03/04/2015  2:00 PM  Treatment Hydrotherapy (Pulse lavage);Packing (Saline gauze) 03/04/2015  2:00 PM   Santyl applied to wound bed prior to applying dressing.   Pressure Ulcer 02/20/15 Unstageable - Full thickness tissue loss in which the base of the ulcer is covered by slough (yellow, tan, gray, green or brown) and/or eschar (tan, brown or black) in the wound bed. (Active)  Dressing Type Barrier Film (skin prep);Gauze (Comment);Moist to dry;Tape dressing;ABD 03/04/2015  2:00 PM  Dressing Changed 03/04/2015  2:00 PM  Dressing Change Frequency Daily 03/04/2015  2:00 PM  State of Healing Early/partial granulation 03/04/2015  2:00 PM  Site / Wound Assessment Pink;Yellow;Red 03/04/2015  2:00 PM  % Wound base Red or Granulating 30% 03/04/2015  2:00 PM  % Wound base Yellow 70% 03/04/2015  2:00 PM  % Wound base Black 0% 03/04/2015  2:00 PM  % Wound base Other (Comment) 0% 03/04/2015  2:00 PM  Peri-wound Assessment Intact 03/04/2015  2:00 PM  Wound Length (cm) 6 cm 03/04/2015  2:00 PM  Wound Width (cm) 6.4 cm 03/04/2015  2:00 PM  Wound Depth (cm) 2.5 cm 03/04/2015  2:00 PM  Undermining (cm) [place] 12:00 to 4:00 with deepest portion 3.9cm.   02/25/2015  2:00 PM  Margins Unattached edges (unapproximated) 03/04/2015  2:00 PM  Drainage Amount Minimal 03/04/2015  2:00 PM  Drainage Description Serosanguineous 03/04/2015  2:00 PM  Treatment Debridement (Selective);Hydrotherapy (Pulse lavage);Packing (Saline gauze) 03/04/2015  2:00 PM   Santyl applied to wound bed prior to applying dressing.   Wound / Incision (Open or Dehisced) 02/25/15 Incision - Open Ischial tuberosity Right Right Ischium (  Active)  Dressing Type Barrier Film (skin prep);Gauze (Comment);Moist to dry;Tape dressing;ABD 03/04/2015  2:00 PM  Dressing Changed Changed 03/04/2015  2:00 PM  Dressing Status Clean;Dry;Intact 03/04/2015  2:00 PM  Dressing Change Frequency Daily 03/04/2015  2:00 PM  Site / Wound Assessment Pink;Red;Yellow 03/04/2015  2:00 PM  % Wound base Red or Granulating 95% 03/04/2015  2:00 PM  % Wound base Yellow 5% 03/04/2015  2:00 PM  % Wound  base Black 0% 03/04/2015  2:00 PM  % Wound base Other (Comment) 0% 03/04/2015  2:00 PM  Peri-wound Assessment Intact 03/04/2015  2:00 PM  Wound Length (cm) 6 cm 03/04/2015  2:00 PM  Wound Width (cm) 8 cm 03/04/2015  2:00 PM  Wound Depth (cm) 3 cm 03/04/2015  2:00 PM  Tunneling (cm) 4 cm at 12 o''clock 03/04/2015  2:00 PM  Undermining (cm) 2-3 cm at 9-11 o'clock 03/04/2015  2:00 PM  Margins Unattached edges (unapproximated) 03/04/2015  2:00 PM  Closure None 03/04/2015  2:00 PM  Drainage Amount Minimal 03/04/2015  2:00 PM  Drainage Description Serosanguineous 03/04/2015  2:00 PM  Treatment Hydrotherapy (Pulse lavage);Packing (Saline gauze) 03/04/2015  2:00 PM   Santyl applied to wound bed prior to applying dressing.    Hydrotherapy Pulsed lavage therapy - wound location: Sacrum and Bil Ischial Pulsed Lavage with Suction (psi): 8 psi Pulsed Lavage with Suction - Normal Saline Used: 1000 mL Pulsed Lavage Tip: Tip with splash shield Selective Debridement Selective Debridement - Location: lt ischial Selective Debridement - Tools Used: Scalpel;Forceps Selective Debridement - Tissue Removed: yellow slough   Wound Assessment and Plan  Wound Therapy - Assess/Plan/Recommendations Wound Therapy - Clinical Statement: Steady progress. Removal of eschar from lt hip progressing. Wound Therapy - Functional Problem List: Multiple wounds and pressure sores. Factors Delaying/Impairing Wound Healing: Altered sensation;Immobility;Polypharmacy Hydrotherapy Plan: Debridement;Dressing change;Patient/family education;Pulsatile lavage with suction Wound Therapy - Frequency: 6X / week Wound Therapy - Current Recommendations: Other (comment) (Continue with Plastics as outpatient.) Wound Therapy - Follow Up Recommendations: [place] (Per MD orders.) Wound Plan: See above.  Wound Therapy Goals- Improve the function of patient's integumentary system by progressing the wound(s) through the phases of wound healing  (inflammation - proliferation - remodeling) by: Decrease Necrotic Tissue to: 30 Decrease Necrotic Tissue - Progress: Progressing toward goal Increase Granulation Tissue to: 70 Increase Granulation Tissue - Progress: Progressing toward goal Improve Drainage Characteristics: Min Improve Drainage Characteristics - Progress: Met Patient/Family will be able to : pt/family to state and perform dressing changes.   Patient/Family Instruction Goal - Progress: Met  Goals will be updated until maximal potential achieved or discharge criteria met.  Discharge criteria: when goals achieved, discharge from hospital, MD decision/surgical intervention, no progress towards goals, refusal/missing three consecutive treatments without notification or medical reason.  GP     Dafina Suk 03/04/2015, 2:17 PM  [facility] PT [phone]

## 2015-03-04 NOTE — Progress Notes (Signed)
Physical Therapy Treatment Patient Details Name: Jason [name] MRN: [id number] DOB: Dec 02, 1985 Today's Date: 03/04/2015    History of Present Illness is a 29 y.o. male with hx of asthma, MRSA infection, seizures, spinal stroke in 4/15, PNA, UTI, ischial osteomyelitis, diabetes mellitus, GERD, who presents with sacral decubitus infection, and anemia, and mild dka     PT Comments    Reveiwed pressure relief, HEP and answered all questions for pt as he hopes to go home soon.  We talked a lot about positioning.  Pt did sitting x 20 minutes on air bed and working UEs and ROM as able.  Pt hoping to go home soon.  Follow Up Recommendations  Home health PT     Equipment Recommendations  None recommended by PT    Recommendations for Other Services       Precautions / Restrictions Precautions Precaution Comments: ischeal and sacral decubitii,  Restrictions Weight Bearing Restrictions: No    Mobility  Bed Mobility Overal bed mobility: Needs Assistance Bed Mobility: Supine to Sit     Supine to sit: Mod assist     General bed mobility comments: pt used BUEs to pull up long sitting, pt sat for 20 minutes and performed lateral weight shifts, hamstring stretching, lumbar rotation stretching,transverse abdominal isometrics, neck AROM  Transfers                    Ambulation/Gait                 Stairs            Wheelchair Mobility    Modified Rankin (Stroke Patients Only)       Balance Overall balance assessment: Needs assistance     Sitting balance - Comments: pt sat in long sitting on air mattress for 20 min with supervision to min A for balance.  he uses his UEs to help with staying forward                            Cognition Arousal/Alertness: Awake/alert Behavior During Therapy: WFL for tasks assessed/performed Overall Cognitive Status: Within Functional Limits for tasks assessed                      Exercises       General Comments General comments (skin integrity, edema, etc.): reviewed pressure relief, skin care, etc to help when he goes home.  Pt had his power wheelchair fixed this week - now it will tilt and recline.   Encouraged pt to use timer on his  phone to remember to pressure reilef      Pertinent Vitals/Pain Pain Assessment: No/denies pain Pain Score: 0-No pain    Home Living                      Prior Function            PT Goals (current goals can now be found in the care plan section) Progress towards PT goals: Progressing toward goals    Frequency  Min 2X/week    PT Plan Frequency needs to be updated    Co-evaluation             End of Session   Activity Tolerance: Patient tolerated treatment well;No increased pain Patient left: in bed;with call bell/phone within reach     Time: 1530-1614 PT Time Calculation (min) (ACUTE ONLY): 44 min  Charges:  $Therapeutic Activity: 38-52 mins                    G Codes:      Loyal Buba 03/04/2015, 4:29 PM 03/04/2015   Rande Lawman, PT

## 2015-03-04 NOTE — Progress Notes (Signed)
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 Tommy Medal and labs showed leukocytosis. Pt was sent to ER for evaluation . Pt has been afebrile, Notes that he has a lot of bloody drainage fro his wounds. Patient started on IV antibiotics (plan is for prolonged therapy due to osteomyelitis). Seen by Dr. Migdalia Dk who has recommended hydrotherapy and no further debridement. Patient has developed DVT and PE; on heparin currently and with future plans to use eliquis.   Assessment/Plan: Active Problems:   Osteomyelitis   Anemia   DKA (diabetic ketoacidoses)   Infected wound   Bilateral pulmonary embolism   Palliative care encounter   Pulmonary embolus   Insomnia   Sacral pain   Pressure ulcer  Acute Hypoxemic respiratory failure -CT angiogram shows Acute pulmonary embolus involving bilateral lower lobes with extensive consolidation of the bilateral lower lobes as described. The consolidations are most likely impart due to pulmonary infarction but underlying pneumonia is not excluded. 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 Johnnye Sima on board and guiding [name] on antibiotic choice and length  -Last debridement. 12/08/14 by Dr. Migdalia Dk, she has been consulted during this admission and per discussion with her; plan is for hydrotherapy at this moment (started on 5/20) -Wound care consultation completed, see note from 5/16 -Continue Air  mattress -Currently on Aztreonam, Vancomycin, metronidazole -day #14 as per ID rec's  Chronic hypotension: Continue midodrine, dose increased during this admission to help maintaining BP. -will monitor VS  Hyponatremia, sodium has improved after sugar control and IVF's -Due to poor oral solute intake -advise to keep himself well hydrated and to maintain good PO intake  Severe Protein calorie malnutrition: will follow rec's from dietitian and continue feeding supplements   Chronic anemia: -Without evidence of acute GI bleeding. But, with high concerns of chronic bleeding from decubitus ulcer -Had 1 unit of PRBC on 3/8.  -Up from last check to 8.1  Uncontrolled DM 1: Mild DKA with metabolic acidosis on admission- -patient acidosis and mild DKA now resolved -A1C 8.1 -will monitor and continue adjusting hypoglycemic regimen as needed; CBG's elevated -will continue SSI (moderate), lantus 25 units and meal coverage just 3 units   Neurogenic bladder:  -Had Klebsiella and BURKHOLDERIA UTI on previous admissions; no dysuria -will monitor -currently on broad spectrum antibiotics  Spinal cord infarction and quadriplegia: -Patient awake alert and oriented 3. Reports pain is much better -will continue Lyrica and baclofen; last one dose adjusted to 20mg  TID (as per home medication orders according to mother) -continue fentanyl patch, continue oxycodone for breakthrough and add tylenol to help with pain  Insomnia: will continue Remeron (helping with sleep and appetite)  S/p Diverting Colostomy and suprapubic catheter  Reactive thrombocytosis: on heparin drip.  Code Status: full Family Communication: discussed with mother at bedside Disposition Plan: Pending disposition decision by patient and mother. Will discuss with social worker next am.  Consultants:  Infectious disease  Palliative Care  Procedures:  None  Antibiotics: Aztreonam, vancomycin and flagyl  5/13>>  HPI/Subjective: Patient has no new complaints today  Objective: Filed Vitals:   03/04/15 0210 03/04/15 0527 03/04/15 1042 03/04/15 1715  BP: 115/76 103/69 107/66 123/68  Pulse: 100 95 102   Temp: 99.5 F (37.5 C) 99.7 F (37.6 C) 98.6 F (37 C) 99.4 F (37.4 C)  TempSrc: Oral Oral Oral Oral  Resp: 18 18 18 16   Height:      Weight:      SpO2: 100% 100% 100% 100%    Intake/Output Summary (Last 24 hours) at 03/04/15 1806 Last data filed at 03/04/15 1400  Gross per 24 hour  Intake 1164.17 ml  Output   4650 ml  Net -3485.83 ml    Exam: Gen: Pt in nad, alert and awake Lungs: Clear to auscultation bilaterally without wheezes or crackles; good air movement Cardiovascular: Regular rate and rhythm without murmur gallop or rub normal S1 and S2 Abdomen: Nontender, nondistended, soft, bowel sounds positive, no rebound, no ascites, no appreciable mass Extremities: No significant cyanosis, clubbing, or edema bilateral lower extremities Neurological:alert to place and person; + quadriplegia   Data Reviewed: Basic Metabolic Panel:  Recent Labs Lab 02/28/15 0625  NA 134*  K 3.9  CL 111  CO2 20*  GLUCOSE 171*  BUN 10  CREATININE 0.52*  CALCIUM 8.3*    Liver Function Tests: No results for input(s): AST, ALT, ALKPHOS, BILITOT, PROT, ALBUMIN in the last 168 hours. CBC:  Recent Labs Lab 02/27/15 0642 02/28/15 0625 03/01/15 0450 03/02/15 0825 03/03/15 0310  WBC 16.0* 14.4* 14.2* 13.8* 16.0*  HGB 8.4* 8.3* 8.6* 7.9* 8.1*  HCT 26.4* 26.0* 26.5* 25.1* 25.5*  MCV 81.2 81.5 81.8 81.5 82.0  PLT 674* 657* 639* 622* 661*   CBG:  Recent Labs Lab 03/03/15 1630 03/03/15 1749 03/03/15 2200 03/04/15 0807 03/04/15 1243  GLUCAP 72 101* 265* 218* 232*    No results found for this or any previous visit (from the past 240 hour(s)).   Studies: Ct Angio Chest Pe W/cm &/or Wo Cm  02/20/2015   CLINICAL DATA:  Acute respiratory failure, hypoxia, shortness of breath,  chest pain.  EXAM: CT ANGIOGRAPHY CHEST WITH CONTRAST  TECHNIQUE: Multidetector CT imaging of the chest was performed using the standard protocol during bolus administration of intravenous contrast. Multiplanar CT image reconstructions and MIPs were obtained to evaluate the vascular anatomy.  CONTRAST:  72mL OMNIPAQUE IOHEXOL 350 MG/ML SOLN  COMPARISON:  January 11, 2015  FINDINGS: There is acute pulmonary embolus in segmental and subsegmental pulmonary arteries of the left lower lobe. There is acute pulmonary embolus in subsegmental pulmonary arteries of the right lower lobe. There are extensive consolidation of the right lower lobe and the medial aspect of the left lower lobe. There are small bilateral pleural effusions. There are lymphadenopathy in the mediastinum. The aorta is normal. There is small pericardial effusion. The visualized upper abdominal structures are normal.  Review of the MIP images confirms the above findings.  IMPRESSION: Acute pulmonary embolus involving bilateral lower lobes with extensive consolidation of the bilateral lower lobes as described. The consolidations are most likely impart due to pulmonary infarction but underlying pneumonia is not excluded.  Critical Value/emergent results were called by telephone at the time of interpretation on 02/20/2015 at 8:33 am to patient's nurse Clarisa Fling who verbally acknowledged these results.   Electronically Signed   By: Abelardo Diesel M.D.   On: 02/20/2015 08:40   Mr Lumbar Spine Wo Contrast  02/19/2015   CLINICAL DATA:  Infected sacral decubitus ulcers. Fever. Leukocytosis.  EXAM: MRI LUMBAR SPINE WITHOUT CONTRAST, MRI OF THE SACRUM WITH AND WITHOUT CONTRAST  TECHNIQUE: Multiplanar, multisequence MR imaging of the lumbar spine and sacrum was performed. Contrast was performed for the sacral imaging.  CONTRAST:  10 cc MultiHance  COMPARISON:  CT scan of the abdomen and pelvis dated 01/11/2015 and MRI of the pelvis dated 01/08/2015  FINDINGS:  MRI of the lumbar spine:  Normal conus tip at L1-2. There is diffuse edema in the posterior paraspinal musculature and in the gluteal muscles. There is also atrophy of the paraspinal musculature. The patient is quadriplegic. The paraspinal soft tissues are otherwise normal.  The discs from T11-12 through L5-S1 are normal. There is low signal throughout the bones of the lumbar spine which may represent a red marrow reactivation. There is no evidence of spinal infection. No spinal or foraminal stenosis or facet arthritis.  MRI of the sacrum:  There is abnormal edema and enhancement of the right ischial tuberosity. There is a deep decubitus ulcer that extends to the ischial tuberosity and there is a small abscess extending to enhance cm laterally from the decubitus ulcer at the ischial tuberosity. This pus collection does communicate with the decubitus ulcer.  There is devitalized soft tissue superficial to the left ischial tuberosity at the site of another decubitus ulcer. There is also devitalized soft tissue at the site of the soft tissue ulcer overlying the sacrum. There is slight edema and enhancement of the remnant of the fifth sacral segment and of the entire fourth thick sacral segment but there is no bone destruction.  No evidence of epidural infection in the sacrum.  There is abnormal subcutaneous edema throughout the pelvis with muscle atrophy.  IMPRESSION: 1. Multiple sacral and ischial tuberosity decubitus ulcers. Pus extends from the depths of the right ischial tuberosity ulcer 2.5 cm laterally. 2. Abnormal edema and enhancement of the right ischial tuberosity with loss of the cortical margin, consistent with osteomyelitis. 3. Abnormal edema and enhancement of the fourth sacral segment and of the remnant of the fifth sacral segment. This is immediately adjacent to a sacral decubitus ulcer. This is not definitive for osteomyelitis however.   Electronically Signed   By: Lorriane Shire M.D.   On: 02/19/2015  13:16   Mr Sacrum/si Joints W Wo Contrast  02/19/2015   CLINICAL DATA:  Infected sacral decubitus ulcers. Fever. Leukocytosis.  EXAM: MRI LUMBAR SPINE WITHOUT CONTRAST, MRI OF THE SACRUM WITH AND WITHOUT CONTRAST  TECHNIQUE: Multiplanar, multisequence MR imaging of the lumbar spine and sacrum was performed. Contrast was performed for the sacral imaging.  CONTRAST:  10 cc MultiHance  COMPARISON:  CT scan of the abdomen and pelvis dated 01/11/2015 and MRI of the pelvis dated 01/08/2015  FINDINGS: MRI of the lumbar spine:  Normal conus tip at L1-2. There is diffuse edema in the posterior paraspinal musculature and in the gluteal muscles. There is also atrophy of the paraspinal musculature. The patient is quadriplegic. The paraspinal soft tissues are otherwise normal.  The discs from T11-12 through L5-S1 are normal. There is low signal throughout the bones of the lumbar spine which may represent a red marrow reactivation. There is no evidence of spinal infection. No spinal or foraminal stenosis or facet arthritis.  MRI of the sacrum:  There is abnormal edema and enhancement of the right ischial tuberosity. There is a deep decubitus ulcer that extends to the ischial tuberosity and there is a small abscess extending to  enhance cm laterally from the decubitus ulcer at the ischial tuberosity. This pus collection does communicate with the decubitus ulcer.  There is devitalized soft tissue superficial to the left ischial tuberosity at the site of another decubitus ulcer. There is also devitalized soft tissue at the site of the soft tissue ulcer overlying the sacrum. There is slight edema and enhancement of the remnant of the fifth sacral segment and of the entire fourth thick sacral segment but there is no bone destruction.  No evidence of epidural infection in the sacrum.  There is abnormal subcutaneous edema throughout the pelvis with muscle atrophy.  IMPRESSION: 1. Multiple sacral and ischial tuberosity decubitus  ulcers. Pus extends from the depths of the right ischial tuberosity ulcer 2.5 cm laterally. 2. Abnormal edema and enhancement of the right ischial tuberosity with loss of the cortical margin, consistent with osteomyelitis. 3. Abnormal edema and enhancement of the fourth sacral segment and of the remnant of the fifth sacral segment. This is immediately adjacent to a sacral decubitus ulcer. This is not definitive for osteomyelitis however.   Electronically Signed   By: Lorriane Shire M.D.   On: 02/19/2015 13:16   Dg Chest Port 1 View  02/20/2015   CLINICAL DATA:  Patient woke up this morning with chest pain and shortness of breath.  EXAM: PORTABLE CHEST - 1 VIEW  COMPARISON:  CT chest 01/11/2015.  Chest 01/10/2015.  FINDINGS: Borderline heart size with normal pulmonary vascularity. Patchy bilateral basilar airspace disease is improving since previous study. Small bilateral pleural effusions. Right PICC catheter tip is over the cavoatrial junction. No pneumothorax. Old fracture deformity of the right clavicle.  IMPRESSION: Basilar airspace infiltrates are improving since previous study. Small bilateral pleural effusions.   Electronically Signed   By: Lucienne Capers M.D.   On: 02/20/2015 05:42    Scheduled Meds: . apixaban  10 mg Oral BID   Followed by  . [START ON 03/10/2015] apixaban  5 mg Oral BID  . aztreonam  2 g Intravenous 3 times per day  . baclofen  20 mg Oral TID  . collagenase   Topical Daily  . feeding supplement (GLUCERNA SHAKE)  237 mL Oral TID BM  . fentaNYL  50 mcg Transdermal Q72H  . insulin aspart  0-15 Units Subcutaneous TID WC  . insulin aspart  3 Units Subcutaneous TID WC  . insulin glargine  32 Units Subcutaneous Daily  . loratadine  10 mg Oral Daily  . magnesium oxide  400 mg Oral Daily  . metoCLOPramide  5 mg Oral Daily  . metroNIDAZOLE  500 mg Oral Q8H  . midodrine  10 mg Oral TID WC  . mirtazapine  7.5 mg Oral QHS  . pantoprazole  40 mg Oral Daily  . potassium chloride  SA  20 mEq Oral Daily  . pregabalin  100 mg Oral TID WC  . pregabalin  200 mg Oral QHS  . sodium chloride  10-40 mL Intracatheter Q12H  . vancomycin  750 mg Intravenous Q12H   Continuous Infusions: . sodium chloride 50 mL/hr at 03/04/15 0203    Active Problems:   Osteomyelitis   Anemia   DKA (diabetic ketoacidoses)   Infected wound   Bilateral pulmonary embolism   Palliative care encounter   Pulmonary embolus   Insomnia   Sacral pain   Pressure ulcer    Time spent: 35 minutes   Velvet Bathe  [facility] Pager [phone] If 7PM-7AM, please contact night-coverage at [email or web address], password [facility]  03/04/2015, 6:06 PM  LOS: 14 days

## 2015-03-05 DIAGNOSIS — L8990 Pressure ulcer of unspecified site, unspecified stage: Secondary | ICD-10-CM

## 2015-03-05 LAB — BASIC METABOLIC PANEL
Anion gap: 4 — ABNORMAL LOW (ref 5–15)
BUN: 17 mg/dL (ref 6–20)
CHLORIDE: 108 mmol/L (ref 101–111)
CO2: 20 mmol/L — ABNORMAL LOW (ref 22–32)
Calcium: 8.4 mg/dL — ABNORMAL LOW (ref 8.9–10.3)
Creatinine, Ser: 0.56 mg/dL — ABNORMAL LOW (ref 0.61–1.24)
GFR calc Af Amer: >60 mL/min (ref >60)
Glucose, Bld: 256 mg/dL — ABNORMAL HIGH (ref 65–99)
Potassium: 3.9 mmol/L (ref 3.5–5.1)
Sodium: 132 mmol/L — ABNORMAL LOW (ref 135–145)

## 2015-03-05 LAB — GLUCOSE, CAPILLARY
GLUCOSE-CAPILLARY: 274 mg/dL — AB (ref 65–99)
GLUCOSE-CAPILLARY: 50 mg/dL — AB (ref 65–99)
GLUCOSE-CAPILLARY: 168 mg/dL — AB (ref 65–99)
GLUCOSE-CAPILLARY: 160 mg/dL — AB (ref 65–99)
Glucose-Capillary: 219 mg/dL — ABNORMAL HIGH (ref 65–99)
Glucose-Capillary: 61 mg/dL — ABNORMAL LOW (ref 65–99)

## 2015-03-05 MED ORDER — METRONIDAZOLE 500 MG PO TABS: 500.0000 mg | ORAL_TABLET | Freq: Three times a day (TID) | ORAL | Status: DC

## 2015-03-05 MED ORDER — MIRTAZAPINE 7.5 MG PO TABS: 7.5000 mg | ORAL_TABLET | Freq: Every day | ORAL | Status: DC

## 2015-03-05 MED ORDER — APIXABAN 5 MG PO TABS
10.0000 mg | ORAL_TABLET | Freq: Two times a day (BID) | ORAL | Status: DC
Start: 2015-03-05 — End: 2015-04-08

## 2015-03-05 MED ORDER — INSULIN GLARGINE 100 UNIT/ML ~~LOC~~ SOLN: 30.0000 [IU] | Freq: Every day | SUBCUTANEOUS | Status: DC

## 2015-03-05 MED ORDER — APIXABAN 5 MG PO TABS: 5.0000 mg | ORAL_TABLET | Freq: Two times a day (BID) | ORAL | Status: DC

## 2015-03-05 MED ORDER — VANCOMYCIN HCL IN DEXTROSE 750-5 MG/150ML-% IV SOLN: 750.0000 mg | Freq: Two times a day (BID) | INTRAVENOUS | Status: DC

## 2015-03-05 MED ORDER — OXYCODONE HCL 10 MG PO TABS: 10.0000 mg | ORAL_TABLET | Freq: Four times a day (QID) | ORAL | Status: DC | PRN

## 2015-03-05 MED ORDER — MIDODRINE HCL 10 MG PO TABS: 10.0000 mg | ORAL_TABLET | Freq: Three times a day (TID) | ORAL | Status: DC

## 2015-03-05 MED ORDER — PREGABALIN 100 MG PO CAPS: 100.0000 mg | ORAL_CAPSULE | Freq: Three times a day (TID) | ORAL | Status: DC

## 2015-03-05 MED ORDER — DEXTROSE 5 % IV SOLN: 2.0000 g | Freq: Three times a day (TID) | INTRAVENOUS | Status: DC

## 2015-03-05 NOTE — Progress Notes (Addendum)
Physical Therapy Wound Treatment Patient Details  Name: Jason [name] MRN: [id number] Date of Birth: 1986-08-14  Today's Date: 03/05/2015 Time: 7124-5809 Time Calculation (min): 38 min   Pt's wounds have made good progress with hydrotherapy. Significant necrotic tissue removed. Remaining necrotic tissue is soft. Feel pt can continue with wet to dry dressings with santyl at home. Feel he needs follow up for continued bedside debridement at wound center and/or with Dr. Migdalia Dk.   Subjective  Subjective: No c/o's Patient and Family Stated Goals: Wounds to heal.  Date of Onset:  (pt unsure exact onset, but states "months".) Prior Treatments: I+D by plastics and dressing changes with Santyl.    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 (Active)  Dressing Type ABD;Barrier Film (skin prep);Gauze (Comment);Tape dressing;Moist to dry 03/05/2015 11:19 AM  Dressing Changed 03/05/2015 11:19 AM  Dressing Change Frequency Twice a day 03/05/2015 11:19 AM  State of Healing Early/partial granulation 03/05/2015 11:19 AM  Site / Wound Assessment Yellow;Pink;Red 03/05/2015 11:19 AM  % Wound base Red or Granulating 75% 03/05/2015 11:19 AM  % Wound base Yellow 25% 03/05/2015 11:19 AM  % Wound base Black 0% 03/05/2015 11:19 AM  % Wound base Other (Comment) 0% 03/05/2015 11:19 AM  Peri-wound Assessment Intact 03/05/2015 11:19 AM  Wound Length (cm) 10 cm 03/04/2015  2:00 PM  Wound Width (cm) 8.5 cm 03/04/2015  2:00 PM  Wound Depth (cm) 3 cm 03/04/2015  2:00 PM  Undermining (cm) undermining from 10 to 3 o'clock from up to 2.5 cm 03/04/2015  2:00 PM  Margins Unattached edges (unapproximated) 03/05/2015 11:19 AM  Drainage Amount Minimal 03/05/2015 11:19 AM  Drainage Description Serosanguineous 03/05/2015 11:19 AM  Treatment Debridement (Selective);Hydrotherapy (Pulse lavage);Packing (Saline gauze) 03/05/2015 11:19 AM    Santyl applied to wound bed prior to applying dressing.      Pressure Ulcer 02/20/15 Unstageable - Full thickness tissue loss in which the base of the ulcer is covered by slough (yellow, tan, gray, green or brown) and/or eschar (tan, brown or black) in the wound bed. (Active)  Dressing Type Barrier Film (skin prep);Gauze (Comment);Moist to dry;Tape dressing;ABD 03/05/2015 11:19 AM  Dressing Changed 03/05/2015 11:19 AM  Dressing Change Frequency Daily 03/05/2015 11:19 AM  State of Healing Eschar 03/05/2015 11:19 AM  Site / Wound Assessment Brown;Pink;Yellow;Black 03/05/2015 11:19 AM  % Wound base Red or Granulating 35% 03/05/2015 11:19 AM  % Wound base Yellow 65% 03/05/2015 11:19 AM  % Wound base Black 0% 03/05/2015 11:19 AM  % Wound base Other (Comment) 0% 03/05/2015 11:19 AM  Peri-wound Assessment Intact 03/05/2015 11:19 AM  Wound Length (cm) 6 cm 03/04/2015  2:00 PM  Wound Width (cm) 6.4 cm 03/04/2015  2:00 PM  Wound Depth (cm) 2.5 cm 03/04/2015  2:00 PM  Undermining (cm) Undermines 12:00 to 4:00 with deepest portion 3.9cm.   02/25/2015  2:00 PM  Margins Unattached edges (unapproximated) 03/05/2015 11:19 AM  Drainage Amount Minimal 03/05/2015 11:19 AM  Drainage Description Serosanguineous 03/05/2015 11:19 AM  Treatment Debridement (Selective);Hydrotherapy (Pulse lavage);Packing (Saline gauze) 03/05/2015 11:19 AM   Santyl applied to wound bed prior to applying dressing.   Wound / Incision (Open or Dehisced) 02/25/15 Incision - Open Ischial tuberosity Right Right Ischium (Active)  Dressing Type Barrier Film (skin prep);Gauze (Comment);Moist to dry;Tape dressing;ABD 03/05/2015 11:19 AM  Dressing Changed Changed 03/05/2015 11:19 AM  Dressing Status Clean;Dry;Intact 03/05/2015 11:19 AM  Dressing Change Frequency Daily 03/05/2015 11:19 AM  Site / Wound Assessment Pink;Red;Yellow 03/05/2015 11:19 AM  % Wound base Red or Granulating 95% 03/05/2015 11:19 AM  % Wound base Yellow 5% 03/05/2015 11:19 AM  % Wound  base Black 0% 03/05/2015 11:19 AM  % Wound base Other (Comment) 0% 03/05/2015 11:19 AM  Peri-wound Assessment Intact 03/05/2015 11:19 AM  Wound Length (cm) 6 cm 03/04/2015  2:00 PM  Wound Width (cm) 8 cm 03/04/2015  2:00 PM  Wound Depth (cm) 3 cm 03/04/2015  2:00 PM  Tunneling (cm) 4 cm at 12 o''clock 03/04/2015  2:00 PM  Undermining (cm) 2-3 cm at 9-11 o'clock 03/04/2015  2:00 PM  Margins Unattached edges (unapproximated) 03/05/2015 11:19 AM  Closure None 03/05/2015 11:19 AM  Drainage Amount Moderate 03/05/2015 11:19 AM  Drainage Description Serosanguineous;Odor 03/05/2015 11:19 AM  Treatment Hydrotherapy (Pulse lavage);Packing (Saline gauze) 03/05/2015 11:19 AM   Santyl applied to wound bed prior to applying dressing.    Hydrotherapy Pulsed lavage therapy - wound location: Sacrum and Bil Ischial Pulsed Lavage with Suction (psi): 8 psi Pulsed Lavage with Suction - Normal Saline Used: 1000 mL Pulsed Lavage Tip: Tip with splash shield Selective Debridement Selective Debridement - Location: Sacral and bil ischial tuberosity wounds Selective Debridement - Tools Used: Scalpel;Forceps Selective Debridement - Tissue Removed: Yellow slough   Wound Assessment and Plan  Wound Therapy - Assess/Plan/Recommendations Wound Therapy - Clinical Statement: Steady progress. Removal of eschar from lt hip progressing. Wound Therapy - Functional Problem List: Multiple wounds and pressure sores. Factors Delaying/Impairing Wound Healing: Altered sensation;Immobility;Polypharmacy Hydrotherapy Plan: Debridement;Dressing change;Patient/family education;Pulsatile lavage with suction Wound Therapy - Frequency: 6X / week Wound Therapy - Current Recommendations: Other (comment) (Continue with Plastics as outpatient.) Wound Therapy - Follow Up Recommendations: Other (comment) (Per MD orders.) Wound Plan: See above.  Wound Therapy Goals- Improve the function of patient's integumentary system by progressing the wound(s)  through the phases of wound healing (inflammation - proliferation - remodeling) by: Decrease Necrotic Tissue to: 30 Decrease Necrotic Tissue - Progress: Progressing toward goal Increase Granulation Tissue to: 70 Increase Granulation Tissue - Progress: Progressing toward goal Improve Drainage Characteristics: Min Patient/Family will be able to : pt/family to state and perform dressing changes.   Patient/Family Instruction Goal - Progress: Met  Goals will be updated until maximal potential achieved or discharge criteria met.  Discharge criteria: when goals achieved, discharge from hospital, MD decision/surgical intervention, no progress towards goals, refusal/missing three consecutive treatments without notification or medical reason.  Jason [name]     Kashayla Ungerer 03/05/2015, 11:29 AM  Suanne Marker PT [phone]

## 2015-03-05 NOTE — Discharge Summary (Addendum)
Physician Discharge Summary  Jason [name] [place] VCB:[id number] DOB: Sep 29, 1986 DOA: 02/18/2015  PCP: [name], MD  Admit date: 02/18/2015 Discharge date: 03/05/2015  Time spent: > 35 minutes  Recommendations for Outpatient Follow-up:  1. Recommend patient follow-up with infectious disease Doctor. within the next one or 2 weeks 2. Patient is supposed to follow-up with Dr. Migdalia Dk at the wound care clinic post discharge 3. Was started on Eliquis for PE (new problem). Most likely will need prior authorization form. I have provided one month free coupon (discussed with case manager) 4. 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 Tommy Medal and labs showed leukocytosis. Pt was sent to ER for evaluation .  Hospital Course:  Acute Hypoxemic respiratory failure -CT angiogram shows Acute pulmonary embolus involving bilateral lower lobes with extensive consolidation of the bilateral lower lobes as described. The consolidations are most likely impart due to pulmonary infarction but underlying pneumonia is not excluded. 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 Johnnye Sima on board while patient in this in house -Last debridement. 12/08/14 by Dr. Migdalia Dk, she has been consulted during this admission and per discussion with her; plan is for hydrotherapy at this moment (started on 5/20) -Wound care consultation completed, see note from 5/16 -Continue Air mattress -Currently on Aztreonam, Vancomycin, metronidazole -for 1 month until 03/18/2015 patient to follow-up with infectious disease for further evaluation  Chronic hypotension: Continue midodrine, dose increased during this admission to help maintaining BP.   Hyponatremia, sodium has improved after sugar control and IVF's -Due to poor oral solute intake   Severe Protein calorie malnutrition:  - Evaluated by our dietitian. Place on nutritional supplementation  Chronic anemia: -Without evidence of acute GI bleeding. But, with high concerns of chronic bleeding from decubitus ulcer -Had 1 unit of PRBC on 3/8.  -Up from last check to 8.1  Uncontrolled DM 1: Mild DKA with metabolic acidosis on admission- -acidosis from and DKA resolved - The patient will go home on higher dose Lantus please see below  Neurogenic bladder:  -Had Klebsiella and BURKHOLDERIA UTI on previous admissions; no dysuria -will monitor -currently on broad spectrum antibiotics which will continue on discharge  Spinal cord infarction and quadriplegia: -Patient awake alert and oriented 3. Reports pain is much better -will continue Lyrica and baclofen -continue fentanyl patch, continue oxycodone for breakthrough  Insomnia: will continue Remeron (helping with sleep and appetite)  S/p Diverting Colostomy and suprapubic catheter.    Consultations:  ID  Critical care  Discharge Exam: Filed Vitals:   03/05/15 1446  BP: 95/47  Pulse: 93  Temp: 99.5 F (37.5 C)  Resp: 20    General: Pt in nad, alert and awake Cardiovascular: no cyanosis Respiratory: No  increased work of breathing, no wheezes  Discharge Instructions   Discharge Instructions    Call MD for:  difficulty breathing, headache or visual disturbances    Complete by:  As directed      Call MD for:  redness, tenderness, or signs of infection (pain, swelling, redness, odor or green/yellow discharge around incision site)    Complete by:  As directed      Call MD for:  temperature >100.4    Complete by:  As directed      Diet - low sodium heart healthy    Complete by:  As directed      Increase activity slowly    Complete by:  As directed           Current Discharge Medication List    START taking these medications   Details  !! apixaban (ELIQUIS) 5 MG TABS tablet Take 2 tablets (10 mg total) by mouth 2 (two) times daily. Qty: 10 tablet, Refills: 0    !! apixaban (ELIQUIS) 5 MG TABS tablet Take 1 tablet (5 mg total) by mouth 2 (two) times daily. Qty: 60 tablet, Refills: 0    aztreonam 2 g in dextrose 5 % 50 mL Inject 2 g into the vein every 8 (eight) hours. Qty: 2 g, Refills: 0    metroNIDAZOLE (FLAGYL) 500 MG tablet Take 1 tablet (500 mg total) by mouth every 8 (eight) hours. Qty: 1 tablet, Refills: 0    mirtazapine (REMERON) 7.5 MG tablet Take 1 tablet (7.5 mg total) by mouth at bedtime. Qty: 30 tablet, Refills: 0    oxyCODONE 10 MG TABS Take 1 tablet (10 mg total) by mouth every 6 (six) hours as needed for breakthrough pain. Qty: 30 tablet, Refills: 0    Vancomycin (VANCOCIN) 750 MG/150ML SOLN Inject 150 mLs (750 mg total) into the vein every 12 (twelve) hours. Qty: 150 mL, Refills: 0     !! - Potential duplicate medications found. Please discuss with provider.    CONTINUE these medications which have CHANGED   Details  insulin glargine (LANTUS) 100 UNIT/ML injection Inject 0.3 mLs (30 Units total) into the skin at bedtime. Qty: 10 mL, Refills: 11    midodrine (PROAMATINE) 10 MG tablet Take 1 tablet (10 mg total) by mouth 3 (three) times daily with  meals. Qty: 90 tablet, Refills: 0    !! pregabalin (LYRICA) 100 MG capsule Take 1 capsule (100 mg total) by mouth 3 (three) times daily. Take 100 mg by mouth in the morning, take 100 mg by mouth in the afternoon, take 100 mg by mouth in the evening and take 200 mg by mouth at bedtime. Qty: 150 capsule, Refills: 4   Associated Diagnoses: Spinal cord infarction; Type 1 diabetes, uncontrolled, with neuropathy; Sacral pressure sore, unspecified pressure ulcer stage; Tetraplegia     !! - Potential duplicate medications found. Please discuss with provider.    CONTINUE these medications which have NOT CHANGED   Details  B Complex-Folic Acid (B COMPLEX-VITAMIN B12 PO) Take 1 tablet by mouth at bedtime.    fentaNYL (DURAGESIC - DOSED MCG/HR) 25 MCG/HR patch Place 1 patch (25 mcg total) onto the skin every 3 (three) days. Qty: 10 patch, Refills: 0   Associated Diagnoses: Spinal cord infarction; Type 1 diabetes, uncontrolled, with neuropathy; Sacral pressure sore, unspecified pressure ulcer stage; Tetraplegia    ibuprofen (ADVIL,MOTRIN) 200 MG  tablet Take 400 mg by mouth every 4 (four) hours as needed for fever.    lidocaine (LMX) 4 % cream Apply 1 application topically daily as needed (gum pain).    omeprazole (PRILOSEC) 40 MG capsule Take 40 mg by mouth daily.    acetaminophen (TYLENOL) 325 MG tablet Take 2 tablets (650 mg total) by mouth every 6 (six) hours as needed for mild pain, moderate pain, fever or headache.    albuterol (PROVENTIL) (2.5 MG/3ML) 0.083% nebulizer solution Take 3 mLs (2.5 mg total) by nebulization every 4 (four) hours as needed for wheezing or shortness of breath. Qty: 75 mL, Refills: 3    baclofen (LIORESAL) 10 MG tablet Take 1-2 tablets (10-20 mg total) by mouth 3 (three) times daily. Takes 10mg  in morning and lunchtime and takes 20mg  at bedtime Qty: 30 each, Refills: 0   Associated Diagnoses: Spinal cord infarction; Type 1 diabetes, uncontrolled, with neuropathy; Sacral  pressure sore, unspecified pressure ulcer stage; Tetraplegia    collagenase (SANTYL) ointment Apply 1 application topically daily. Qty: 90 g, Refills: 5    diclofenac sodium (VOLTAREN) 1 % GEL Apply 2 g topically daily as needed (for pain). Qty: 100 g, Refills: 0    feeding supplement, GLUCERNA SHAKE, (GLUCERNA SHAKE) LIQD Take 237 mLs by mouth 3 (three) times daily between meals. Qty: 30 Can, Refills: 0    glucagon (GLUCAGON EMERGENCY) 1 MG injection Inject 1 mg into the vein once as needed. Qty: 1 each, Refills: 12    glucose blood (ACCU-CHEK SMARTVIEW) test strip Use to check blood sugar 4 times daily. Qty: 400 each, Refills: 3    insulin aspart (NOVOLOG) 100 UNIT/ML injection Inject 5 Units into the skin 3 (three) times daily with meals. Qty: 10 mL, Refills: 11    loratadine (CLARITIN) 10 MG tablet Take 10 mg by mouth at bedtime.     ondansetron (ZOFRAN ODT) 8 MG disintegrating tablet Take 1 tablet (8 mg total) by mouth every 8 (eight) hours as needed for nausea or vomiting. Qty: 60 tablet, Refills: 5    !! pregabalin (LYRICA) 200 MG capsule Take 1 capsule (200 mg total) by mouth at bedtime. Qty: 30 capsule, Refills: 0    sodium chloride (OCEAN) 0.65 % SOLN nasal spray Place 1 spray into both nostrils as needed for congestion.     !! - Potential duplicate medications found. Please discuss with provider.    STOP taking these medications     dexlansoprazole (DEXILANT) 60 MG capsule      fluconazole (DIFLUCAN) 200 MG tablet      lidocaine (XYLOCAINE) 2 % jelly      oxyCODONE-acetaminophen (PERCOCET) 10-325 MG per tablet      potassium chloride SA (KLOR-CON M20) 20 MEQ tablet      QUEtiapine (SEROQUEL) 25 MG tablet      tigecycline 50 mg in sodium chloride 0.9 % 100 mL        Allergies  Allergen Reactions  . Cefuroxime Axetil Anaphylaxis  . Ertapenem Other (See Comments)    Rash and confusion  . Morphine And Related Other (See Comments)    Changed mental  status, confusion, headache, visual hallucination  . Penicillins Anaphylaxis and Other (See Comments)    ?can take amoxicillin?  . Sulfa Antibiotics Anaphylaxis, Shortness Of Breath and Other (See Comments)  . Tessalon [Benzonatate] Anaphylaxis  . Shellfish Allergy Itching and Other (See Comments)    Took benadryl to alleviate reaction  . Miripirium Rash and Other (See Comments)  Change in mental status      The results of significant diagnostics from this hospitalization (including imaging, microbiology, ancillary and laboratory) are listed below for reference.    Significant Diagnostic Studies: Ct Angio Chest Pe W/cm &/or Wo Cm  02/20/2015   CLINICAL DATA:  Acute respiratory failure, hypoxia, shortness of breath, chest pain.  EXAM: CT ANGIOGRAPHY CHEST WITH CONTRAST  TECHNIQUE: Multidetector CT imaging of the chest was performed using the standard protocol during bolus administration of intravenous contrast. Multiplanar CT image reconstructions and MIPs were obtained to evaluate the vascular anatomy.  CONTRAST:  65mL OMNIPAQUE IOHEXOL 350 MG/ML SOLN  COMPARISON:  January 11, 2015  FINDINGS: There is acute pulmonary embolus in segmental and subsegmental pulmonary arteries of the left lower lobe. There is acute pulmonary embolus in subsegmental pulmonary arteries of the right lower lobe. There are extensive consolidation of the right lower lobe and the medial aspect of the left lower lobe. There are small bilateral pleural effusions. There are lymphadenopathy in the mediastinum. The aorta is normal. There is small pericardial effusion. The visualized upper abdominal structures are normal.  Review of the MIP images confirms the above findings.  IMPRESSION: Acute pulmonary embolus involving bilateral lower lobes with extensive consolidation of the bilateral lower lobes as described. The consolidations are most likely impart due to pulmonary infarction but underlying pneumonia is not excluded.  Critical  Value/emergent results were called by telephone at the time of interpretation on 02/20/2015 at 8:33 am to patient's nurse Clarisa Fling who verbally acknowledged these results.   Electronically Signed   By: Abelardo Diesel M.D.   On: 02/20/2015 08:40   Mr Lumbar Spine Wo Contrast  02/19/2015   CLINICAL DATA:  Infected sacral decubitus ulcers. Fever. Leukocytosis.  EXAM: MRI LUMBAR SPINE WITHOUT CONTRAST, MRI OF THE SACRUM WITH AND WITHOUT CONTRAST  TECHNIQUE: Multiplanar, multisequence MR imaging of the lumbar spine and sacrum was performed. Contrast was performed for the sacral imaging.  CONTRAST:  10 cc MultiHance  COMPARISON:  CT scan of the abdomen and pelvis dated 01/11/2015 and MRI of the pelvis dated 01/08/2015  FINDINGS: MRI of the lumbar spine:  Normal conus tip at L1-2. There is diffuse edema in the posterior paraspinal musculature and in the gluteal muscles. There is also atrophy of the paraspinal musculature. The patient is quadriplegic. The paraspinal soft tissues are otherwise normal.  The discs from T11-12 through L5-S1 are normal. There is low signal throughout the bones of the lumbar spine which may represent a red marrow reactivation. There is no evidence of spinal infection. No spinal or foraminal stenosis or facet arthritis.  MRI of the sacrum:  There is abnormal edema and enhancement of the right ischial tuberosity. There is a deep decubitus ulcer that extends to the ischial tuberosity and there is a small abscess extending to enhance cm laterally from the decubitus ulcer at the ischial tuberosity. This pus collection does communicate with the decubitus ulcer.  There is devitalized soft tissue superficial to the left ischial tuberosity at the site of another decubitus ulcer. There is also devitalized soft tissue at the site of the soft tissue ulcer overlying the sacrum. There is slight edema and enhancement of the remnant of the fifth sacral segment and of the entire fourth thick sacral  segment but there is no bone destruction.  No evidence of epidural infection in the sacrum.  There is abnormal subcutaneous edema throughout the pelvis with muscle atrophy.  IMPRESSION: 1. Multiple sacral and ischial  tuberosity decubitus ulcers. Pus extends from the depths of the right ischial tuberosity ulcer 2.5 cm laterally. 2. Abnormal edema and enhancement of the right ischial tuberosity with loss of the cortical margin, consistent with osteomyelitis. 3. Abnormal edema and enhancement of the fourth sacral segment and of the remnant of the fifth sacral segment. This is immediately adjacent to a sacral decubitus ulcer. This is not definitive for osteomyelitis however.   Electronically Signed   By: Lorriane Shire M.D.   On: 02/19/2015 13:16   Mr Sacrum/si Joints W Wo Contrast  02/19/2015   CLINICAL DATA:  Infected sacral decubitus ulcers. Fever. Leukocytosis.  EXAM: MRI LUMBAR SPINE WITHOUT CONTRAST, MRI OF THE SACRUM WITH AND WITHOUT CONTRAST  TECHNIQUE: Multiplanar, multisequence MR imaging of the lumbar spine and sacrum was performed. Contrast was performed for the sacral imaging.  CONTRAST:  10 cc MultiHance  COMPARISON:  CT scan of the abdomen and pelvis dated 01/11/2015 and MRI of the pelvis dated 01/08/2015  FINDINGS: MRI of the lumbar spine:  Normal conus tip at L1-2. There is diffuse edema in the posterior paraspinal musculature and in the gluteal muscles. There is also atrophy of the paraspinal musculature. The patient is quadriplegic. The paraspinal soft tissues are otherwise normal.  The discs from T11-12 through L5-S1 are normal. There is low signal throughout the bones of the lumbar spine which may represent a red marrow reactivation. There is no evidence of spinal infection. No spinal or foraminal stenosis or facet arthritis.  MRI of the sacrum:  There is abnormal edema and enhancement of the right ischial tuberosity. There is a deep decubitus ulcer that extends to the ischial tuberosity and  there is a small abscess extending to enhance cm laterally from the decubitus ulcer at the ischial tuberosity. This pus collection does communicate with the decubitus ulcer.  There is devitalized soft tissue superficial to the left ischial tuberosity at the site of another decubitus ulcer. There is also devitalized soft tissue at the site of the soft tissue ulcer overlying the sacrum. There is slight edema and enhancement of the remnant of the fifth sacral segment and of the entire fourth thick sacral segment but there is no bone destruction.  No evidence of epidural infection in the sacrum.  There is abnormal subcutaneous edema throughout the pelvis with muscle atrophy.  IMPRESSION: 1. Multiple sacral and ischial tuberosity decubitus ulcers. Pus extends from the depths of the right ischial tuberosity ulcer 2.5 cm laterally. 2. Abnormal edema and enhancement of the right ischial tuberosity with loss of the cortical margin, consistent with osteomyelitis. 3. Abnormal edema and enhancement of the fourth sacral segment and of the remnant of the fifth sacral segment. This is immediately adjacent to a sacral decubitus ulcer. This is not definitive for osteomyelitis however.   Electronically Signed   By: Lorriane Shire M.D.   On: 02/19/2015 13:16   Dg Chest Port 1 View  02/20/2015   CLINICAL DATA:  Patient woke up this morning with chest pain and shortness of breath.  EXAM: PORTABLE CHEST - 1 VIEW  COMPARISON:  CT chest 01/11/2015.  Chest 01/10/2015.  FINDINGS: Borderline heart size with normal pulmonary vascularity. Patchy bilateral basilar airspace disease is improving since previous study. Small bilateral pleural effusions. Right PICC catheter tip is over the cavoatrial junction. No pneumothorax. Old fracture deformity of the right clavicle.  IMPRESSION: Basilar airspace infiltrates are improving since previous study. Small bilateral pleural effusions.   Electronically Signed   By: Gwyndolyn Saxon  Gerilyn Nestle M.D.   On:  02/20/2015 05:42    Microbiology: No results found for this or any previous visit (from the past 240 hour(s)).   Labs: Basic Metabolic Panel:  Recent Labs Lab 02/28/15 0625 03/05/15 0533  NA 134* 132*  K 3.9 3.9  CL 111 108  CO2 20* 20*  GLUCOSE 171* 256*  BUN 10 17  CREATININE 0.52* 0.56*  CALCIUM 8.3* 8.4*   Liver Function Tests: No results for input(s): AST, ALT, ALKPHOS, BILITOT, PROT, ALBUMIN in the last 168 hours. No results for input(s): LIPASE, AMYLASE in the last 168 hours. No results for input(s): AMMONIA in the last 168 hours. CBC:  Recent Labs Lab 02/27/15 0642 02/28/15 0625 03/01/15 0450 03/02/15 0825 03/03/15 0310  WBC 16.0* 14.4* 14.2* 13.8* 16.0*  HGB 8.4* 8.3* 8.6* 7.9* 8.1*  HCT 26.4* 26.0* 26.5* 25.1* 25.5*  MCV 81.2 81.5 81.8 81.5 82.0  PLT 674* 657* 639* 622* 661*   Cardiac Enzymes: No results for input(s): CKTOTAL, CKMB, CKMBINDEX, TROPONINI in the last 168 hours. BNP: BNP (last 3 results) No results for input(s): BNP in the last 8760 hours.  ProBNP (last 3 results) No results for input(s): PROBNP in the last 8760 hours.  CBG:  Recent Labs Lab 03/04/15 0807 03/04/15 1243 03/04/15 2219 03/05/15 0807 03/05/15 1221  GLUCAP 218* 232* 227* 219* 274*       Signed:  Velvet Bathe  [facility] 03/05/2015, 4:34 PM  Patient seen with no new complaints or medical issues reported on day of discharge. OK to proceed with discharge. Pt will need to follow up with Dr. Dennard Schaumann, [facility]

## 2015-03-05 NOTE — Progress Notes (Addendum)
Confirmed with pt that he doesn't want to go to an out of county facility.  Unit CSW has determined that pt would need placement at a [facility] facility in either [place] or [place], but placement in either facility would create hardship for pt's family d/t transportation issues.  Mom believes that family support is critical to pt's healing and placing pt in a remote facility would not be in Jason [name]'s best interests.  Mom (pt's primary caregiver) and pt are both agreeable to d/c home and mom is confident in her abilities to continuing caring for him.  Discussed with PT who supports d/c home with dressing changes and f/u with the wound clinic for debridement.  Dr. Wendee Beavers updated.  CSW signing off. Please re-consult as necessary.

## 2015-03-05 NOTE — Care Management Note (Addendum)
Case Management Note  Patient Details  Name: Jason [name] MRN: [id number] Date of Birth: Jun 14, 1986  Subjective/Objective:                    Action/Plan:   Expected Discharge Date:                  Expected Discharge Plan:  [place]  In-House Referral:     Discharge planning Services  CM Consult  Post Acute Care Choice:  [place], Resumption of Svcs/PTA Provider Choice offered to:  Parent  DME Arranged:    DME Agency:     HH Arranged:  RN, IV Antibiotics HH Agency:  [place]  Status of Service:     Medicare Important Message Given:  No Date Medicare IM Given:    Medicare IM give by:    Date Additional Medicare IM Given:    Additional Medicare Important Message give by:     If discussed at [place] of Stay Meetings, dates discussed:    Additional Comments:  12:30 Cm received call from [facility] and copay for PT/OT will be in excess of 170.00+.  CM notified pt and Mom who both state they are following up with the Neuro clinic and have not have HHPT/OT in the past and politely refuse it now.  [place] notified of refusal.  NO other CM needs were communicated.   11:15 CM received test from [place] stating they cannot have [place] until Thursday 03/10/15. CM callbacked and cancelled with [place] and called [facility] rep, Tiffany who is arranging HHRN/PT/OT.  No other CM needs were communicated.    5/59/16 10:00 CM met with pt and pt's mom in room and gave family free ostomy supply information from [employer].  CM also gave Mom resource of [employer] (ostomy product supplier in [place] which accepts Medicaid) for the day [name] is no longer needed for supplies.  Cm faxed IV ABX prescriptions to [facility] pharmacy with request of 18:00 SOC at home.  CM called Lennette Bihari with referral for HHRN/PT/OT.  [place] aware of discharge today and need for [facility].  CM called CSW to make aware of need for non-emergency transport home later today.  No other Cm needs were  communicated.    CM received call from RN for home IV ABX.  CM requested prescriptions.  CM called and received callback from MD and explained at this hour, I am not able to arrange for [facility] until tomorrow.  CM Called RN and informed the pt will not be going home today and CM will arrange for home IV ABX tomorrow. Dellie Catholic, RN 03/05/2015, 5:20 PM

## 2015-03-06 LAB — GLUCOSE, CAPILLARY
Glucose-Capillary: 138 mg/dL — ABNORMAL HIGH (ref 65–99)
Glucose-Capillary: 125 mg/dL — ABNORMAL HIGH (ref 65–99)
Glucose-Capillary: 236 mg/dL — ABNORMAL HIGH (ref 65–99)

## 2015-03-06 MED ORDER — HEPARIN SOD (PORK) LOCK FLUSH 100 UNIT/ML IV SOLN
250.0000 [IU] | INTRAVENOUS | Status: AC | PRN
  Administered 2015-03-06: 250 [IU]

## 2015-03-06 NOTE — Progress Notes (Signed)
LCSW was called due to patient needing a ride home via ambulance. LCSW to complete arrange, spoke with RN who is in agreement after 4 pm.   LCSW completed med necessity form for EMS.  Lane Hacker, MSW Clinical Social Work: Emergency Room [phone]

## 2015-03-06 NOTE — Progress Notes (Signed)
On-call provider Fredirick Maudlin, NP notified of low grade fever (100). No new orders received at this time. Will cont to monitor.

## 2015-03-06 NOTE — Plan of Care (Signed)
Problem: Phase III Progression Outcomes Goal: Voiding independently Outcome: Not Applicable Date Met:  22/24/11 Pt has suprapubic catheter

## 2015-03-06 NOTE — Progress Notes (Signed)
NURSING PROGRESS NOTE  Jason [name] [id number] Discharge Data: 03/06/2015 5:10 PM Attending Provider: Velvet Bathe, MD PCP:FRY,STEPHEN A, MD   Jason [name] to be D/C'd Home per MD order.    All IV's will be discontinued and monitored for bleeding.  All belongings will be returned to patient for patient to take home.  Last Documented Vital Signs:  Blood pressure 120/78, pulse 99, temperature 98.4 F (36.9 C), temperature source Oral, resp. rate 20, height 5\' 8"  (1.727 m), weight 50.8 kg (111 lb 15.9 oz), SpO2 100 %.  Joslyn Hy, MSN, RN, [employer]

## 2015-03-08 LAB — GLUCOSE, CAPILLARY: Glucose-Capillary: 78 mg/dL (ref 65–99)

## 2015-03-14 ENCOUNTER — Telehealth: Payer: Self-pay | Admitting: *Deleted

## 2015-03-14 ENCOUNTER — Telehealth: Payer: Self-pay | Admitting: Family Medicine

## 2015-03-14 NOTE — Telephone Encounter (Signed)
Critical High Platelets = 1076, reported to Dr Tommy Medal.  RN requested that the pt's PCP also be notified of this result.  Nira Conn, RN stated that she would notify PCP also.

## 2015-03-14 NOTE — Telephone Encounter (Signed)
And Dr. Sarajane Jews aware

## 2015-03-14 NOTE — Telephone Encounter (Signed)
Noted  

## 2015-03-14 NOTE — Telephone Encounter (Signed)
Nira Conn [phone]) from [place] called letting us know that Mr. Crumrine recent labs after hospital visit are High Alert 1076 on his platelets. Nira Conn said that when that receive the results that they will fax them over.

## 2015-03-15 ENCOUNTER — Encounter: Payer: Medicaid Other | Attending: Registered Nurse | Admitting: Registered Nurse

## 2015-03-15 ENCOUNTER — Encounter: Payer: Self-pay | Admitting: Registered Nurse

## 2015-03-15 VITALS — BP 98/61 | HR 80 | Resp 16

## 2015-03-15 DIAGNOSIS — Z79899 Other long term (current) drug therapy: Secondary | ICD-10-CM

## 2015-03-15 DIAGNOSIS — L89159 Pressure ulcer of sacral region, unspecified stage: Secondary | ICD-10-CM

## 2015-03-15 DIAGNOSIS — Z5181 Encounter for therapeutic drug level monitoring: Secondary | ICD-10-CM | POA: Diagnosis present

## 2015-03-15 DIAGNOSIS — N319 Neuromuscular dysfunction of bladder, unspecified: Secondary | ICD-10-CM

## 2015-03-15 DIAGNOSIS — G9519 Other vascular myelopathies: Secondary | ICD-10-CM | POA: Diagnosis present

## 2015-03-15 DIAGNOSIS — IMO0002 Reserved for concepts with insufficient information to code with codable children: Secondary | ICD-10-CM

## 2015-03-15 DIAGNOSIS — E1140 Type 2 diabetes mellitus with diabetic neuropathy, unspecified: Secondary | ICD-10-CM | POA: Diagnosis present

## 2015-03-15 DIAGNOSIS — E1041 Type 1 diabetes mellitus with diabetic mononeuropathy: Secondary | ICD-10-CM

## 2015-03-15 DIAGNOSIS — E1040 Type 1 diabetes mellitus with diabetic neuropathy, unspecified: Secondary | ICD-10-CM

## 2015-03-15 DIAGNOSIS — E1065 Type 1 diabetes mellitus with hyperglycemia: Secondary | ICD-10-CM

## 2015-03-15 DIAGNOSIS — G8250 Quadriplegia, unspecified: Secondary | ICD-10-CM

## 2015-03-15 DIAGNOSIS — G9511 Acute infarction of spinal cord (embolic) (nonembolic): Secondary | ICD-10-CM

## 2015-03-15 DIAGNOSIS — M869 Osteomyelitis, unspecified: Secondary | ICD-10-CM

## 2015-03-15 DIAGNOSIS — N390 Urinary tract infection, site not specified: Secondary | ICD-10-CM | POA: Diagnosis present

## 2015-03-15 DIAGNOSIS — G894 Chronic pain syndrome: Secondary | ICD-10-CM

## 2015-03-15 MED ORDER — FENTANYL 25 MCG/HR TD PT72: 25.0000 ug | MEDICATED_PATCH | TRANSDERMAL | Status: DC

## 2015-03-15 MED ORDER — OXYCODONE HCL 10 MG PO TABS: 10.0000 mg | ORAL_TABLET | Freq: Four times a day (QID) | ORAL | Status: DC | PRN

## 2015-03-15 NOTE — Progress Notes (Signed)
Subjective:    Patient ID: Jason [name], male    DOB: 16-Dec-1985, 29 y.o.   MRN: [id number]  HPI: Jason [name] is a 29 year old male who returns for chronic pain and medication refill. He says his pain is located in his bilateral shoulder's, mid-lower back and buttocks has a sacral decubitus.  He rates his pain 8.  Jason [name] states his pain intensifies with wound changes, his mother agrees, she's doing the dressing changes. I will speak with Dr. Naaman Plummer and give them a call she verbalizes understanding.   Jason [name] was hospitalized at [facility] 02/18/15- 03/05/15 For Acute Hypoxemic Respiratory Failure he had a CT Angiogram it showed Acute Pulmonary Embolus. 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  Pain Inventory Average Pain 8 Pain Right Now 8 My pain is constant  In the last 24 hours, has pain interfered with the following? General activity 7 Relation with others 9 Enjoyment of life 10 What TIME of day is your pain at its worst? night Sleep (in general) Fair  Pain is worse with: unsure Pain improves with: medication Relief from Meds: 4  Mobility use a wheelchair needs help with transfers  Function I need assistance with the following:  feeding, dressing, bathing, toileting, meal prep, household duties and shopping  Neuro/Psych weakness numbness tremor spasms  Prior Studies Any changes since last visit?  yes  Hospitalization from this office last visit and then developed a PE  Physicians involved in your care Any changes since last visit?  no Primary care [name]   Family History  Problem Relation Age of Onset  . Diabetes Father   . Hypertension Father   . Asthma      fhx  . Hypertension      fhx  . Stroke      fhx  . Heart disease Mother    History   Social History  . Marital Status: Single    Spouse Name: N/A  . Number of Children: 0  . Years of Education: 11   Occupational History  . Disability     Social History Main Topics  . Smoking status: Former Smoker -- 1.00 packs/day for 12 years    Types: Cigars, Cigarettes    Quit date: 09/07/2014  . Smokeless tobacco: Never Used  . Alcohol Use: No  . Drug Use: No  . Sexual Activity: Not Currently   Other Topics Concern  . None   Social History Narrative   Patient lives at home with mother father.    Patient has 2 children.    Patient is single.    Patient was left hand but after stroke patient is now right handed.    Patient has  11th grade education.    Past Surgical History  Procedure Laterality Date  . Tonsillectomy    . Multiple extractions with alveoloplasty N/A 08/03/2014    Procedure: MULTIPLE EXTRACTIONS;  Surgeon: Gae Bon, DDS;  Location: [place];  Service: Oral Surgery;  Laterality: N/A;  . Tee without cardioversion N/A 08/17/2014    Procedure: TRANSESOPHAGEAL ECHOCARDIOGRAM (TEE);  Surgeon: Dorothy Spark, MD;  Location: [facility] ENDOSCOPY;  Service: Cardiovascular;  Laterality: N/A;  . Debridment of decubitus ulcer N/A 10/04/2014    Procedure: DEBRIDMENT OF DECUBITUS ULCER;  Surgeon: Georganna Skeans, MD;  Location: [place];  Service: General;  Laterality: N/A;  . Laparoscopic diverted colostomy N/A 10/12/2014    Procedure: LAPAROSCOPIC DIVERTING COLOSTOMY;  Surgeon: Donnie Mesa,  MD;  Location: [place];  Service: General;  Laterality: N/A;  . Insertion of suprapubic catheter N/A 10/12/2014    Procedure: INSERTION OF SUPRAPUBIC CATHETER;  Surgeon: Reece Packer, MD;  Location: [place];  Service: Urology;  Laterality: N/A;  . Incision and drainage of wound N/A 11/12/2014    Procedure: IRRIGATION AND DEBRIDEMENT OF WOUNDS WITH BONE BIOPSY AND SURGICAL PREP ;  Surgeon: Theodoro Kos, DO;  Location: [place];  Service: Plastics;  Laterality: N/A;  . Application of a-cell of back N/A 11/12/2014    Procedure: APPLICATION A CELL AND VAC ;  Surgeon: Theodoro Kos, DO;  Location: [place];  Service: Plastics;  Laterality: N/A;  .  Incision and drainage of wound N/A 11/18/2014    Procedure: IRRIGATION AND DEBRIDEMENT OF SACRAL ULCER ONLY WITH PLACEMENT OF A CELL AND VAC/ DRESSING CHANGE TO UPPER BACK AREA.;  Surgeon: Theodoro Kos, DO;  Location: [place];  Service: Plastics;  Laterality: N/A;  . Incision and drainage of wound N/A 11/25/2014    Procedure: IRRIGATION AND DEBRIDEMENT OF SACRAL ULCER AND BACK BURN WITH PLACEMENT OF A-CELL;  Surgeon: Theodoro Kos, DO;  Location: [place];  Service: Plastics;  Laterality: N/A;  . Minor application of wound vac N/A 11/25/2014    Procedure:  WOUND VAC CHANGE;  Surgeon: Theodoro Kos, DO;  Location: [place];  Service: Plastics;  Laterality: N/A;  . Incision and drainage of wound Right 12/08/2014    Procedure: IRRIGATION AND DEBRIDEMENT SACRAL WOUND AND RIGHT ISCHIAL WOUND ;  Surgeon: Theodoro Kos, DO;  Location: [place];  Service: Plastics;  Laterality: Right;  . Application of a-cell of back N/A 12/08/2014    Procedure: APPLICATION OF A-CELL AND WOUND VAC ;  Surgeon: Theodoro Kos, DO;  Location: [place];  Service: Plastics;  Laterality: N/A;   Past Medical History  Diagnosis Date  . GERD (gastroesophageal reflux disease)   . Asthma   . Hx MRSA infection     on face  . Gastroparesis   . Diabetic neuropathy   . Seizures   . Family history of anesthesia complication     Pt mother can't have epidural procedures  . Dysrhythmia   . Pneumonia   . Arthritis   . Fibromyalgia   . 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 Type I diabetes mellitus     sees Dr. Loanne Drilling   . Syncope 02/16/2015   BP 98/61 mmHg  Pulse 80  Resp 16  SpO2 99%  Opioid Risk Score:   Fall Risk Score: Low Fall Risk (0-5 points)`1  Depression screen PHQ 2/9  Depression screen PHQ 2/9 02/16/2015  Decreased Interest 0  Down, Depressed, Hopeless 0  PHQ - 2 Score 0     Review of Systems  Constitutional: Positive for unexpected weight change.  Gastrointestinal: Positive for abdominal pain.        Bowel Control Problems  Genitourinary:       Bladder control problems  Musculoskeletal:       Spasms  Skin: Positive for wound.       Decubitus is down to bone   Neurological: Positive for tremors, weakness and numbness.  All other systems reviewed and are negative.      Objective:   Physical Exam  Constitutional: He is oriented to person, place, and time. He appears well-developed and well-nourished.  HENT:  Head: Normocephalic and atraumatic.  Neck: Normal range of motion. Neck supple.  Cardiovascular: Normal rate and  regular rhythm.   Pulmonary/Chest: Effort normal and breath sounds normal.  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  Skin: Skin is warm and dry.  Psychiatric: He has a normal mood and affect.  Nursing note and vitals reviewed.         Assessment & Plan:  1. 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 Chronic Pain Management: RX: Oxycodone 10 mg one tablet every 6 hours as needed #40 and Fentanyl 25 mcg one patch every three days. #10  Continue Lyrica, Voltaren gel. 3. Spinal cord infarct: Continue current medication regime 4. DM type 2 with gastroparesis: PCP Following 5. ChronicBLE wounds/ Sacral Decubitus: Infectious Disease Following/ S/ P Diverting Colostomy and Supra Pubic Catheter  6. Severe diabetic peripheral neuropathy: Continue Lyrica 7. Neurogenic bladder/urethral ulceration: S/P Supra Pubic Catheter Urology Following  69minutes of face to face patient care time was spent during this visit. All questions were encouraged and answered.

## 2015-03-16 NOTE — Telephone Encounter (Signed)
[facility] [name] for [facility] called to see if Dr Tommy Medal had responded to note about patient Platelets of 1076. Advised him not yet but will resend and he wanted to add WBC 17.6, Hemo 8.1, Hematocrit 26, and Albumin 2.3. Advised him will call him asap

## 2015-03-17 ENCOUNTER — Other Ambulatory Visit: Payer: Self-pay | Admitting: Licensed Clinical Social Worker

## 2015-03-17 NOTE — Telephone Encounter (Signed)
The best we can do for him is advise he go to ED for thorough investigation. I suspect his wounds are getting worse and infection is recurring. I dont have capacity to see him today and he should go sooner rather than later

## 2015-03-17 NOTE — Telephone Encounter (Signed)
Spoke with patient's mother and she states that he is feeling well, eating good, normal output. She states she wants to wait until the RN comes back out on Monday to redraw labs. Mother would like Dr. Tommy Medal to know this and see if this is possible. Please advise

## 2015-03-17 NOTE — Telephone Encounter (Signed)
That is fine but his WBC beng up so much and platelets also being up mean there is SIGNIFICANT inflammation going on here and that means likely uncontrolled infection. I am OK if they want to wait till Monday so long as they understand my advice is to be seen earlier. I can understand this is VERY difficult situation

## 2015-03-21 ENCOUNTER — Telehealth: Payer: Self-pay | Admitting: Registered Nurse

## 2015-03-21 MED ORDER — OXYCODONE HCL 10 MG PO TABS: 10.0000 mg | ORAL_TABLET | Freq: Four times a day (QID) | ORAL | Status: DC | PRN

## 2015-03-21 NOTE — Telephone Encounter (Signed)
Spoke with Dr. Naaman Plummer regarding increasing Jason [name] oxycodone. He agrees with plan. He may take two tablets ( 20 mg) of oxycodone 10 mg one hour prior to wound changes daily. Spoke with his mother Mrs. Standley Bargo, she verbalizes understanding. He will not exceed 5 tablets a day.   Encouraged to call if she has any questions and concerns she verbalizes understanding.  Mr. Orlandis Sanden will pick up the script.

## 2015-03-23 ENCOUNTER — Telehealth: Payer: Self-pay | Admitting: *Deleted

## 2015-03-23 NOTE — Telephone Encounter (Signed)
Prior auth for Lidoderm patches initiated with [employer] was denied as Jasman did not meet criteria.  Information given to Dr Naaman Plummer.

## 2015-03-24 ENCOUNTER — Telehealth: Payer: Self-pay | Admitting: *Deleted

## 2015-03-24 NOTE — Telephone Encounter (Signed)
This week's lab results received from home health agency.  Results have not been reviewed by Dr. Tommy Medal.  Mother aware that blood glucose was elevated due to 10:41 Am lab draw time.  Mother aware that pt is still bleeding and hence the low hemoglobin.  Shared CRP number.  MD please call pt's mother if there is additional concerns.

## 2015-03-25 NOTE — Telephone Encounter (Signed)
I still have not seen these labs

## 2015-03-30 ENCOUNTER — Emergency Department (HOSPITAL_COMMUNITY)
Admission: EM | Admit: 2015-03-30 | Discharge: 2015-03-31 | Disposition: A | Discharge status: Home / Self Care | Payer: Medicaid Other | Attending: Emergency Medicine | Admitting: Emergency Medicine

## 2015-03-30 ENCOUNTER — Telehealth: Payer: Self-pay | Admitting: *Deleted

## 2015-03-30 ENCOUNTER — Encounter (HOSPITAL_COMMUNITY): Payer: Self-pay | Admitting: Emergency Medicine

## 2015-03-30 ENCOUNTER — Emergency Department (HOSPITAL_COMMUNITY): Payer: Medicaid Other

## 2015-03-30 DIAGNOSIS — Z87891 Personal history of nicotine dependence: Secondary | ICD-10-CM | POA: Insufficient documentation

## 2015-03-30 DIAGNOSIS — Z79899 Other long term (current) drug therapy: Secondary | ICD-10-CM | POA: Diagnosis not present

## 2015-03-30 DIAGNOSIS — Z8739 Personal history of other diseases of the musculoskeletal system and connective tissue: Secondary | ICD-10-CM | POA: Insufficient documentation

## 2015-03-30 DIAGNOSIS — K219 Gastro-esophageal reflux disease without esophagitis: Secondary | ICD-10-CM | POA: Diagnosis not present

## 2015-03-30 DIAGNOSIS — N390 Urinary tract infection, site not specified: Secondary | ICD-10-CM

## 2015-03-30 DIAGNOSIS — Z8614 Personal history of Methicillin resistant Staphylococcus aureus infection: Secondary | ICD-10-CM | POA: Diagnosis not present

## 2015-03-30 DIAGNOSIS — L89159 Pressure ulcer of sacral region, unspecified stage: Secondary | ICD-10-CM | POA: Insufficient documentation

## 2015-03-30 DIAGNOSIS — J45909 Unspecified asthma, uncomplicated: Secondary | ICD-10-CM | POA: Diagnosis not present

## 2015-03-30 DIAGNOSIS — R509 Fever, unspecified: Secondary | ICD-10-CM | POA: Diagnosis present

## 2015-03-30 DIAGNOSIS — Z8701 Personal history of pneumonia (recurrent): Secondary | ICD-10-CM | POA: Diagnosis not present

## 2015-03-30 DIAGNOSIS — E1040 Type 1 diabetes mellitus with diabetic neuropathy, unspecified: Secondary | ICD-10-CM | POA: Insufficient documentation

## 2015-03-30 DIAGNOSIS — Z880 Allergy status to penicillin: Secondary | ICD-10-CM | POA: Insufficient documentation

## 2015-03-30 DIAGNOSIS — Z794 Long term (current) use of insulin: Secondary | ICD-10-CM | POA: Diagnosis not present

## 2015-03-30 DIAGNOSIS — Z8673 Personal history of transient ischemic attack (TIA), and cerebral infarction without residual deficits: Secondary | ICD-10-CM | POA: Diagnosis not present

## 2015-03-30 LAB — URINE MICROSCOPIC-ADD ON

## 2015-03-30 LAB — CBC WITH DIFFERENTIAL/PLATELET
Basophils Absolute: 0.1 10*3/uL (ref 0.0–0.1)
Basophils Relative: 1 % (ref 0–1)
EOS ABS: 0.3 10*3/uL (ref 0.0–0.7)
Eosinophils Relative: 2 % (ref 0–5)
HCT: 24.8 % — ABNORMAL LOW (ref 39.0–52.0)
Hemoglobin: 7.7 g/dL — ABNORMAL LOW (ref 13.0–17.0)
Lymphocytes Relative: 21 % (ref 12–46)
Lymphs Abs: 3.2 10*3/uL (ref 0.7–4.0)
MCH: 25.7 pg — ABNORMAL LOW (ref 26.0–34.0)
MCHC: 31 g/dL (ref 30.0–36.0)
MCV: 82.7 fL (ref 78.0–100.0)
Monocytes Absolute: 2.4 10*3/uL — ABNORMAL HIGH (ref 0.1–1.0)
Monocytes Relative: 16 % — ABNORMAL HIGH (ref 3–12)
NEUTROS PCT: 60 % (ref 43–77)
Neutro Abs: 9.5 10*3/uL — ABNORMAL HIGH (ref 1.7–7.7)
Platelets: 781 10*3/uL — ABNORMAL HIGH (ref 150–400)
RBC: 3 MIL/uL — AB (ref 4.22–5.81)
RDW: 15.1 % (ref 11.5–15.5)
WBC: 15.5 10*3/uL — ABNORMAL HIGH (ref 4.0–10.5)

## 2015-03-30 LAB — URINALYSIS, ROUTINE W REFLEX MICROSCOPIC
Bilirubin Urine: NEGATIVE
Glucose, UA: >1000 mg/dL — AB
KETONES UR: NEGATIVE mg/dL
NITRITE: NEGATIVE
PROTEIN: 30 mg/dL — AB
Specific Gravity, Urine: 1.022 (ref 1.005–1.030)
Urobilinogen, UA: 0.2 mg/dL (ref 0.0–1.0)
pH: 6 (ref 5.0–8.0)

## 2015-03-30 LAB — COMPREHENSIVE METABOLIC PANEL
ALBUMIN: 2.2 g/dL — AB (ref 3.5–5.0)
ALT: 10 U/L — ABNORMAL LOW (ref 17–63)
ANION GAP: 6 (ref 5–15)
AST: 13 U/L — ABNORMAL LOW (ref 15–41)
Alkaline Phosphatase: 88 U/L (ref 38–126)
BILIRUBIN TOTAL: 0.4 mg/dL (ref 0.3–1.2)
BUN: 12 mg/dL (ref 6–20)
CHLORIDE: 99 mmol/L — AB (ref 101–111)
CO2: 25 mmol/L (ref 22–32)
Calcium: 9 mg/dL (ref 8.9–10.3)
Creatinine, Ser: 1.05 mg/dL (ref 0.61–1.24)
GFR calc Af Amer: >60 mL/min (ref >60)
GFR calc non Af Amer: >60 mL/min (ref >60)
Glucose, Bld: 375 mg/dL — ABNORMAL HIGH (ref 65–99)
POTASSIUM: 4.2 mmol/L (ref 3.5–5.1)
SODIUM: 130 mmol/L — AB (ref 135–145)
TOTAL PROTEIN: 7.9 g/dL (ref 6.5–8.1)

## 2015-03-30 LAB — I-STAT CG4 LACTIC ACID, ED: LACTIC ACID, VENOUS: 0.71 mmol/L (ref 0.5–2.0)

## 2015-03-30 MED ORDER — BACLOFEN 10 MG PO TABS
20.0000 mg | ORAL_TABLET | Freq: Once | ORAL | Status: AC
  Administered 2015-03-30: 20 mg via ORAL
  Filled 2015-03-30: qty 2

## 2015-03-30 MED ORDER — SODIUM CHLORIDE 0.9 % IV BOLUS (SEPSIS)
1000.0000 mL | Freq: Once | INTRAVENOUS | Status: AC
  Administered 2015-03-30: 1000 mL via INTRAVENOUS

## 2015-03-30 MED ORDER — CIPROFLOXACIN IN D5W 400 MG/200ML IV SOLN
400.0000 mg | Freq: Once | INTRAVENOUS | Status: AC
  Administered 2015-03-30: 400 mg via INTRAVENOUS
  Filled 2015-03-30: qty 200

## 2015-03-30 MED ORDER — PREGABALIN 50 MG PO CAPS
100.0000 mg | ORAL_CAPSULE | Freq: Once | ORAL | Status: AC
  Administered 2015-03-30: 100 mg via ORAL
  Filled 2015-03-30: qty 2

## 2015-03-30 MED ORDER — CIPROFLOXACIN HCL 500 MG PO TABS: 500.0000 mg | ORAL_TABLET | Freq: Two times a day (BID) | ORAL | Status: DC

## 2015-03-30 MED ORDER — OXYCODONE HCL 5 MG PO TABS
10.0000 mg | ORAL_TABLET | Freq: Once | ORAL | Status: AC
  Administered 2015-03-30: 10 mg via ORAL
  Filled 2015-03-30: qty 2

## 2015-03-30 NOTE — ED Notes (Signed)
Patient mother states patient is being seen tonight for concern of fever x2 days. Patient temp at home ~101. Mother states patient is not to take tylenol because he is anemic and the increased risk of bleeding, when asked to clarify patient mother is insistent that patient was taken off Percocet because of the bleeding risk. Patient mother states she has given him motrin at home. Patient has no complaints. Patient mother states she believes the patient may have a UTI as he just recently finished antibiotics and will routinely have UTI after completion of abx.

## 2015-03-30 NOTE — Telephone Encounter (Signed)
[facility] RN reports that the pt's mother told her that the pt had a fever of 101 degrees last night.  The mother does not want the PICC removed.  [facility] RN asking what Dr. Tommy Medal would like done.  Phone call to Dr. Tommy Medal.  Dr. Tommy Medal recommended that the pt go to the ED ASAP to be evaluated.  Shared Dr. Lucianne Lei Dam's message with the [facility] RN.  She will call the pt's mother with this message.

## 2015-03-30 NOTE — ED Provider Notes (Signed)
CSN: [id number]     Arrival date & time 03/30/15  1932 History   First MD Initiated Contact with Patient 03/30/15 1942     Chief Complaint  Patient presents with  . 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 Maximum temperature was 101. Today the temperature was lower in the low 100s and when his home health nurse checked it was 98 axillary. The patient recently got out of the hospital and has finished his IV antibodies to his PICC line for a staph infection due to his multiple decubitus ulcers. Since then he has come off of the vancomycin and aztreonam and has been doing well. No cough, shortness of breath or vomiting. PICC line site in his right arm has not been swelling or red. Has a chronic suprapubic catheter, site has been clean but mom has noticed increased sediment over the past 24-48 hours. The infectious disease doctor told patient to come in to be evaluated due to the fever.  Past Medical History  Diagnosis Date  . GERD (gastroesophageal reflux disease)   . Asthma   . Hx MRSA infection     on face  . Gastroparesis   . Diabetic neuropathy   . Seizures   . Family history of anesthesia complication     Pt mother can't have epidural procedures  . Dysrhythmia   . Pneumonia   . Arthritis   . Fibromyalgia   . 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 Type I diabetes mellitus     sees Dr. Loanne Drilling   . Syncope 02/16/2015   Past Surgical History  Procedure Laterality Date  . Tonsillectomy    . Multiple extractions with alveoloplasty N/A 08/03/2014    Procedure: MULTIPLE EXTRACTIONS;  Surgeon: Gae Bon, DDS;  Location: [place];  Service: Oral Surgery;  Laterality: N/A;  . Tee without cardioversion N/A 08/17/2014    Procedure: TRANSESOPHAGEAL ECHOCARDIOGRAM (TEE);   Surgeon: Dorothy Spark, MD;  Location: [facility] ENDOSCOPY;  Service: Cardiovascular;  Laterality: N/A;  . Debridment of decubitus ulcer N/A 10/04/2014    Procedure: DEBRIDMENT OF DECUBITUS ULCER;  Surgeon: Georganna Skeans, MD;  Location: [place];  Service: General;  Laterality: N/A;  . Laparoscopic diverted colostomy N/A 10/12/2014    Procedure: LAPAROSCOPIC DIVERTING COLOSTOMY;  Surgeon: Donnie Mesa, MD;  Location: [place];  Service: General;  Laterality: N/A;  . Insertion of suprapubic catheter N/A 10/12/2014    Procedure: INSERTION OF SUPRAPUBIC CATHETER;  Surgeon: Reece Packer, MD;  Location: [place];  Service: Urology;  Laterality: N/A;  . Incision and drainage of wound N/A 11/12/2014    Procedure: IRRIGATION AND DEBRIDEMENT OF WOUNDS WITH BONE BIOPSY AND SURGICAL PREP ;  Surgeon: Theodoro Kos, DO;  Location: [place];  Service: Plastics;  Laterality: N/A;  . Application of a-cell of back N/A 11/12/2014    Procedure: APPLICATION A CELL AND VAC ;  Surgeon: Theodoro Kos, DO;  Location: [place];  Service: Plastics;  Laterality: N/A;  . Incision and drainage of wound N/A 11/18/2014    Procedure: IRRIGATION AND DEBRIDEMENT OF SACRAL ULCER ONLY WITH PLACEMENT OF A CELL AND VAC/ DRESSING CHANGE TO UPPER BACK AREA.;  Surgeon: Theodoro Kos, DO;  Location: [place];  Service: Plastics;  Laterality: N/A;  . Incision and drainage of wound N/A 11/25/2014  Procedure: IRRIGATION AND DEBRIDEMENT OF SACRAL ULCER AND BACK BURN WITH PLACEMENT OF A-CELL;  Surgeon: Theodoro Kos, DO;  Location: [place];  Service: Plastics;  Laterality: N/A;  . Minor application of wound vac N/A 11/25/2014    Procedure:  WOUND VAC CHANGE;  Surgeon: Theodoro Kos, DO;  Location: [place];  Service: Plastics;  Laterality: N/A;  . Incision and drainage of wound Right 12/08/2014    Procedure: IRRIGATION AND DEBRIDEMENT SACRAL WOUND AND RIGHT ISCHIAL WOUND ;  Surgeon: Theodoro Kos, DO;  Location: [place];  Service: Plastics;  Laterality: Right;  .  Application of a-cell of back N/A 12/08/2014    Procedure: APPLICATION OF A-CELL AND WOUND VAC ;  Surgeon: Theodoro Kos, DO;  Location: [place];  Service: Plastics;  Laterality: N/A;   Family History  Problem Relation Age of Onset  . Diabetes Father   . Hypertension Father   . Asthma      fhx  . Hypertension      fhx  . Stroke      fhx  . Heart disease Mother    History  Substance Use Topics  . Smoking status: Former Smoker -- 1.00 packs/day for 12 years    Types: Cigars, Cigarettes    Quit date: 09/07/2014  . Smokeless tobacco: Never Used  . Alcohol Use: No    Review of Systems  Constitutional: Positive for fever.  HENT: Negative for congestion.   Respiratory: Negative for cough and shortness of breath.   Cardiovascular: Negative for chest pain.  Gastrointestinal: Negative for vomiting.  Genitourinary:       Increased sediment in urine  Musculoskeletal: Negative for arthralgias.  All other systems reviewed and are negative.     Allergies  Cefuroxime axetil; Ertapenem; Morphine and related; Penicillins; Sulfa antibiotics; Tessalon; Shellfish allergy; and Miripirium  Home Medications   Prior to Admission medications   Medication Sig Start Date End Date Taking? Authorizing Provider  acetaminophen (TYLENOL) 325 MG tablet Take 2 tablets (650 mg total) by mouth every 6 (six) hours as needed for mild pain, moderate pain, fever or headache. 01/14/15   Geradine Girt, DO  albuterol (PROVENTIL) (2.5 MG/3ML) 0.083% nebulizer solution Take 3 mLs (2.5 mg total) by nebulization every 4 (four) hours as needed for wheezing or shortness of breath. 04/27/14   [name], MD  apixaban (ELIQUIS) 5 MG TABS tablet Take 2 tablets (10 mg total) by mouth 2 (two) times daily. 03/05/15 03/09/15  Velvet Bathe, MD  apixaban (ELIQUIS) 5 MG TABS tablet Take 1 tablet (5 mg total) by mouth 2 (two) times daily. 03/10/15   Velvet Bathe, MD  aztreonam 2 g in dextrose 5 % 50 mL Inject 2 g into the vein every 8  (eight) hours. 03/05/15   Velvet Bathe, MD  B Complex-Folic Acid (B COMPLEX-VITAMIN B12 PO) Take 1 tablet by mouth at bedtime.    Historical Provider, MD  baclofen (LIORESAL) 10 MG tablet Take 1-2 tablets (10-20 mg total) by mouth 3 (three) times daily. Takes 10mg  in morning and lunchtime and takes 20mg  at bedtime 11/29/14   Meredith Staggers, MD  collagenase (SANTYL) ointment Apply 1 application topically daily. 08/30/14   [name], MD  diclofenac sodium (VOLTAREN) 1 % GEL Apply 2 g topically daily as needed (for pain). Patient taking differently: Apply 2 g topically 2 (two) times daily as needed (for pain).  11/19/14   Shanker Kristeen Mans, MD  feeding supplement, [place], ([place]) LIQD  Take 237 mLs by mouth 3 (three) times daily between meals. 12/10/14   Florencia Reasons, MD  fentaNYL (DURAGESIC - DOSED MCG/HR) 25 MCG/HR patch Place 1 patch (25 mcg total) onto the skin every 3 (three) days. 03/15/15   Bayard Hugger, NP  glucagon (GLUCAGON EMERGENCY) 1 MG injection Inject 1 mg into the vein once as needed. 04/20/14   [name], MD  glucose blood (ACCU-CHEK SMARTVIEW) test strip Use to check blood sugar 4 times daily. 01/20/15   Renato Shin, MD  ibuprofen (ADVIL,MOTRIN) 200 MG tablet Take 400 mg by mouth every 4 (four) hours as needed for fever.    Historical Provider, MD  insulin aspart (NOVOLOG) 100 UNIT/ML injection Inject 5 Units into the skin 3 (three) times daily with meals. 01/14/15   Geradine Girt, DO  insulin glargine (LANTUS) 100 UNIT/ML injection Inject 0.3 mLs (30 Units total) into the skin at bedtime. 03/05/15   Velvet Bathe, MD  lidocaine (LMX) 4 % cream Apply 1 application topically daily as needed (gum pain).    Historical Provider, MD  loratadine (CLARITIN) 10 MG tablet Take 10 mg by mouth at bedtime.     Historical Provider, MD  metroNIDAZOLE (FLAGYL) 500 MG tablet Take 1 tablet (500 mg total) by mouth every 8 (eight) hours. 03/05/15   Velvet Bathe, MD  midodrine (PROAMATINE) 10  MG tablet Take 1 tablet (10 mg total) by mouth 3 (three) times daily with meals. 03/05/15   Velvet Bathe, MD  mirtazapine (REMERON) 15 MG tablet  03/08/15   Historical Provider, MD  mirtazapine (REMERON) 7.5 MG tablet Take 1 tablet (7.5 mg total) by mouth at bedtime. 03/05/15   Velvet Bathe, MD  omeprazole (PRILOSEC) 40 MG capsule Take 40 mg by mouth daily.    Historical Provider, MD  ondansetron (ZOFRAN ODT) 8 MG disintegrating tablet Take 1 tablet (8 mg total) by mouth every 8 (eight) hours as needed for nausea or vomiting. 12/29/14   [name], MD  Oxycodone HCl 10 MG TABS Take 1 tablet (10 mg total) by mouth every 6 (six) hours as needed. 03/21/15   Bayard Hugger, NP  oxyCODONE-acetaminophen (PERCOCET) 10-325 MG per tablet Take 1 tablet by mouth every 8 (eight) hours. 01/26/15   Historical Provider, MD  pregabalin (LYRICA) 100 MG capsule Take 1 capsule (100 mg total) by mouth 3 (three) times daily. Take 100 mg by mouth in the morning, take 100 mg by mouth in the afternoon, take 100 mg by mouth in the evening and take 200 mg by mouth at bedtime. 03/05/15   Velvet Bathe, MD  sodium chloride (OCEAN) 0.65 % SOLN nasal spray Place 1 spray into both nostrils as needed for congestion.    Historical Provider, MD  Vancomycin (VANCOCIN) 750 MG/150ML SOLN Inject 150 mLs (750 mg total) into the vein every 12 (twelve) hours. 03/05/15   Velvet Bathe, MD   BP 86/57 mmHg  Pulse 98  Temp(Src) 98.9 F (37.2 C) (Oral)  Resp 16  SpO2 98% Physical Exam  Constitutional: He is oriented to person, place, and time. He appears well-developed and well-nourished.  HENT:  Head: Normocephalic and atraumatic.  Right Ear: External ear normal.  Left Ear: External ear normal.  Nose: Nose normal.  Eyes: Right eye exhibits no discharge. Left eye exhibits no discharge.  Neck: Neck supple.  Cardiovascular: Normal rate, regular rhythm, normal heart sounds and intact distal pulses.   Pulmonary/Chest: Effort normal and breath  sounds normal. He has  no wheezes. He has no rales.  Abdominal: Soft. He exhibits no distension. There is no tenderness.  Musculoskeletal: He exhibits no edema.  Neurological: He is alert and oriented to person, place, and time.  Skin: Skin is warm and dry.     Nursing note and vitals reviewed.   ED Course  Procedures (including critical care time) Labs Review Labs Reviewed  COMPREHENSIVE METABOLIC PANEL - Abnormal; Notable for the following:    Sodium 130 (*)    Chloride 99 (*)    Glucose, Bld 375 (*)    Albumin 2.2 (*)    AST 13 (*)    ALT 10 (*)    All other components within normal limits  CBC WITH DIFFERENTIAL/PLATELET - Abnormal; Notable for the following:    WBC 15.5 (*)    RBC 3.00 (*)    Hemoglobin 7.7 (*)    HCT 24.8 (*)    MCH 25.7 (*)    Platelets 781 (*)    Neutro Abs 9.5 (*)    Monocytes Relative 16 (*)    Monocytes Absolute 2.4 (*)    All other components within normal limits  URINALYSIS, ROUTINE W REFLEX MICROSCOPIC (NOT AT [facility]) - Abnormal; Notable for the following:    APPearance CLOUDY (*)    Glucose, UA >1000 (*)    Hgb urine dipstick SMALL (*)    Protein, ur 30 (*)    Leukocytes, UA MODERATE (*)    All other components within normal limits  URINE CULTURE  CULTURE, BLOOD (ROUTINE X 2)  CULTURE, BLOOD (ROUTINE X 2)  URINE MICROSCOPIC-ADD ON  I-STAT CG4 LACTIC ACID, ED    Imaging Review Dg Chest Port 1 View  03/30/2015   CLINICAL DATA:  Fever, quadriplegia  EXAM: PORTABLE CHEST - 1 VIEW  COMPARISON:  02/20/2015  FINDINGS: Cardiomediastinal silhouette is stable. Right arm PICC line with tip in SVC right atrium junction. No pneumothorax. No acute infiltrate or pulmonary edema. Old fracture of the right clavicle again noted.  IMPRESSION: No active disease.   Electronically Signed   By: Lahoma Crocker M.D.   On: 03/30/2015 20:47     EKG Interpretation None      MDM   Final diagnoses:  UTI (lower urinary tract infection)    Patient possibly has a  UTI, although contamination is a high probability given that it is obtained from his suprapubic cath. His PICC line site appears well, but this is also a potential nidus of infection. His white blood cell is elevated but this appears to be a chronic issue. He has a low-grade temperature of 100.1 rectally. I discussed his case with the infectious disease on-call, Dr. Linus Salmons, who recommends removal of PICC, treatment of UTI with oral antibiotics and blood cultures. ID to follow blood cultures, return if they become positive. Otherwise f/u closely with PCP and ID    Sherwood Gambler, MD 03/30/15 2313

## 2015-03-30 NOTE — ED Notes (Addendum)
Per EMS, patient had labs drawn today by home health. Patient MD from infectious disease called family and states the patients labs were abnormal and needed to come in for evaluation. Patients VS WNL for patient. Patient has no complaints, mother reports cloudy urine. Patient has open pressure sores to back and buttocks, received wound care today. 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 Patient/family did not state what labs are abnormal.

## 2015-03-30 NOTE — ED Notes (Signed)
Bed: WA01 Expected date:  Expected time:  Means of arrival:  Comments: EMS-abnormal labs 

## 2015-03-30 NOTE — ED Notes (Signed)
Patient given warm blanket, sandwich, and drink.

## 2015-03-30 NOTE — ED Notes (Signed)
Patient is currently wearing a Fentanyl patch. Patient is due for 10mg  Oxycodone, 100mg  Lyrica, and 10mg  Baclofen at this time. Will notify MD.

## 2015-03-31 ENCOUNTER — Telehealth: Payer: Self-pay

## 2015-03-31 NOTE — ED Notes (Addendum)
[facility] dispatch called for transport to home.

## 2015-03-31 NOTE — Telephone Encounter (Signed)
[facility],  - [address]: mirtazapine (REMERON) 7.5 MG tablet and midodrine (PROAMATINE) 10 MG tablet

## 2015-04-01 ENCOUNTER — Encounter (HOSPITAL_COMMUNITY): Payer: Self-pay | Admitting: Emergency Medicine

## 2015-04-01 ENCOUNTER — Emergency Department (HOSPITAL_COMMUNITY)
Admission: EM | Admit: 2015-04-01 | Discharge: 2015-04-01 | Disposition: A | Discharge status: Home / Self Care | Payer: Medicaid Other | Attending: Emergency Medicine | Admitting: Emergency Medicine

## 2015-04-01 DIAGNOSIS — Z7951 Long term (current) use of inhaled steroids: Secondary | ICD-10-CM | POA: Insufficient documentation

## 2015-04-01 DIAGNOSIS — Y838 Other surgical procedures as the cause of abnormal reaction of the patient, or of later complication, without mention of misadventure at the time of the procedure: Secondary | ICD-10-CM | POA: Insufficient documentation

## 2015-04-01 DIAGNOSIS — Z8701 Personal history of pneumonia (recurrent): Secondary | ICD-10-CM | POA: Diagnosis not present

## 2015-04-01 DIAGNOSIS — E1040 Type 1 diabetes mellitus with diabetic neuropathy, unspecified: Secondary | ICD-10-CM | POA: Insufficient documentation

## 2015-04-01 DIAGNOSIS — J45909 Unspecified asthma, uncomplicated: Secondary | ICD-10-CM | POA: Diagnosis not present

## 2015-04-01 DIAGNOSIS — Z8614 Personal history of Methicillin resistant Staphylococcus aureus infection: Secondary | ICD-10-CM | POA: Insufficient documentation

## 2015-04-01 DIAGNOSIS — Z79899 Other long term (current) drug therapy: Secondary | ICD-10-CM | POA: Insufficient documentation

## 2015-04-01 DIAGNOSIS — R7989 Other specified abnormal findings of blood chemistry: Secondary | ICD-10-CM | POA: Insufficient documentation

## 2015-04-01 DIAGNOSIS — Z87891 Personal history of nicotine dependence: Secondary | ICD-10-CM | POA: Insufficient documentation

## 2015-04-01 DIAGNOSIS — K219 Gastro-esophageal reflux disease without esophagitis: Secondary | ICD-10-CM | POA: Diagnosis not present

## 2015-04-01 DIAGNOSIS — K3184 Gastroparesis: Secondary | ICD-10-CM | POA: Insufficient documentation

## 2015-04-01 DIAGNOSIS — R899 Unspecified abnormal finding in specimens from other organs, systems and tissues: Secondary | ICD-10-CM

## 2015-04-01 DIAGNOSIS — M1990 Unspecified osteoarthritis, unspecified site: Secondary | ICD-10-CM | POA: Insufficient documentation

## 2015-04-01 DIAGNOSIS — Z880 Allergy status to penicillin: Secondary | ICD-10-CM | POA: Diagnosis not present

## 2015-04-01 DIAGNOSIS — G40909 Epilepsy, unspecified, not intractable, without status epilepticus: Secondary | ICD-10-CM | POA: Insufficient documentation

## 2015-04-01 DIAGNOSIS — L89159 Pressure ulcer of sacral region, unspecified stage: Secondary | ICD-10-CM | POA: Diagnosis not present

## 2015-04-01 DIAGNOSIS — T83038A Leakage of other indwelling urethral catheter, initial encounter: Secondary | ICD-10-CM | POA: Diagnosis not present

## 2015-04-01 DIAGNOSIS — Z794 Long term (current) use of insulin: Secondary | ICD-10-CM | POA: Diagnosis not present

## 2015-04-01 DIAGNOSIS — Z8673 Personal history of transient ischemic attack (TIA), and cerebral infarction without residual deficits: Secondary | ICD-10-CM | POA: Insufficient documentation

## 2015-04-01 LAB — BASIC METABOLIC PANEL
Anion gap: 7 (ref 5–15)
BUN: 9 mg/dL (ref 6–20)
CO2: 26 mmol/L (ref 22–32)
CREATININE: 0.53 mg/dL — AB (ref 0.61–1.24)
Calcium: 9.2 mg/dL (ref 8.9–10.3)
Chloride: 97 mmol/L — ABNORMAL LOW (ref 101–111)
GLUCOSE: 167 mg/dL — AB (ref 65–99)
Potassium: 3.7 mmol/L (ref 3.5–5.1)
Sodium: 130 mmol/L — ABNORMAL LOW (ref 135–145)

## 2015-04-01 LAB — URINE CULTURE: Culture: >=100000

## 2015-04-01 LAB — CBC WITH DIFFERENTIAL/PLATELET
Basophils Absolute: 0 10*3/uL (ref 0.0–0.1)
Basophils Relative: 0 % (ref 0–1)
EOS ABS: 0.6 10*3/uL (ref 0.0–0.7)
Eosinophils Relative: 4 % (ref 0–5)
HCT: 26.1 % — ABNORMAL LOW (ref 39.0–52.0)
Hemoglobin: 8.3 g/dL — ABNORMAL LOW (ref 13.0–17.0)
LYMPHS ABS: 3.4 10*3/uL (ref 0.7–4.0)
Lymphocytes Relative: 24 % (ref 12–46)
MCH: 25.6 pg — AB (ref 26.0–34.0)
MCHC: 31.8 g/dL (ref 30.0–36.0)
MCV: 80.6 fL (ref 78.0–100.0)
MONO ABS: 2.3 10*3/uL — AB (ref 0.1–1.0)
Monocytes Relative: 16 % — ABNORMAL HIGH (ref 3–12)
Neutro Abs: 7.8 10*3/uL — ABNORMAL HIGH (ref 1.7–7.7)
Neutrophils Relative %: 56 % (ref 43–77)
PLATELETS: 818 10*3/uL — AB (ref 150–400)
RBC: 3.24 MIL/uL — ABNORMAL LOW (ref 4.22–5.81)
RDW: 14.9 % (ref 11.5–15.5)
WBC: 14.1 10*3/uL — ABNORMAL HIGH (ref 4.0–10.5)

## 2015-04-01 LAB — I-STAT CG4 LACTIC ACID, ED: Lactic Acid, Venous: 0.85 mmol/L (ref 0.5–2.0)

## 2015-04-01 MED ORDER — NITROFURANTOIN MONOHYD MACRO 100 MG PO CAPS: 100.0000 mg | ORAL_CAPSULE | Freq: Two times a day (BID) | ORAL | Status: DC

## 2015-04-01 NOTE — Discharge Instructions (Signed)

## 2015-04-01 NOTE — ED Notes (Signed)
Pt and pt's caregiver(mother) verbalized understanding of d/c instructions and no further questions. [facility] called for transport.

## 2015-04-01 NOTE — ED Notes (Signed)
Pt with large leaking "blister" to back. Family reports that he has several wounds inccluding to sacrum, ischium and L foot.

## 2015-04-01 NOTE — Telephone Encounter (Signed)
Can we refill these? 

## 2015-04-01 NOTE — ED Provider Notes (Signed)
CSN: [id number]     Arrival date & time 04/01/15  1650 History   First MD Initiated Contact with Patient 04/01/15 1707     Chief Complaint  Patient presents with  . 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 Patient was notified that he had one positive blood culture which was remarkable for gram-positive cocci. Patient was encouraged to come to the emergency department for reevaluation. He states that he feels fine. Denies running a fever. Denies any cough, chest pain, abdominal pain, nausea, vomiting, or diarrhea. He has been taking his Cipro which was prescribed for UTI after consultation with Dr. Linus Salmons of infectious disease. There are no aggravating or alleviating factors.  The history is provided by the patient. No language interpreter was used.    Past Medical History  Diagnosis Date  . GERD (gastroesophageal reflux disease)   . Asthma   . Hx MRSA infection     on face  . Gastroparesis   . Diabetic neuropathy   . Seizures   . Family history of anesthesia complication     Pt mother can't have epidural procedures  . Dysrhythmia   . Pneumonia   . Arthritis   . Fibromyalgia   . 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 Type I diabetes mellitus     sees Dr. Loanne Drilling   . Syncope 02/16/2015   Past Surgical History  Procedure Laterality Date  . Tonsillectomy    . Multiple extractions with alveoloplasty N/A 08/03/2014    Procedure: MULTIPLE EXTRACTIONS;  Surgeon: Gae Bon, DDS;  Location: [place];  Service: Oral Surgery;  Laterality: N/A;  . Tee without cardioversion N/A 08/17/2014    Procedure: TRANSESOPHAGEAL ECHOCARDIOGRAM (TEE);  Surgeon: Dorothy Spark, MD;  Location: [facility];  Service: Cardiovascular;  Laterality: N/A;  . Debridment of decubitus ulcer N/A 10/04/2014    Procedure: DEBRIDMENT OF DECUBITUS ULCER;  Surgeon: Georganna Skeans, MD;  Location: [place];  Service: General;  Laterality: N/A;  . Laparoscopic diverted colostomy N/A 10/12/2014    Procedure: LAPAROSCOPIC DIVERTING COLOSTOMY;  Surgeon: Donnie Mesa, MD;  Location: [place];  Service: General;  Laterality: N/A;  . Insertion of suprapubic catheter N/A 10/12/2014    Procedure: INSERTION OF SUPRAPUBIC CATHETER;  Surgeon: Reece Packer, MD;  Location: [place];  Service: Urology;  Laterality: N/A;  . Incision and drainage of wound N/A 11/12/2014    Procedure: IRRIGATION AND DEBRIDEMENT OF WOUNDS WITH BONE BIOPSY AND SURGICAL PREP ;  Surgeon: Theodoro Kos, DO;  Location: [place];  Service: Plastics;  Laterality: N/A;  . Application of a-cell of back N/A 11/12/2014    Procedure: APPLICATION A CELL AND VAC ;  Surgeon: Theodoro Kos, DO;  Location: [place];  Service: Plastics;  Laterality: N/A;  . Incision and drainage of wound N/A 11/18/2014    Procedure: IRRIGATION AND DEBRIDEMENT OF SACRAL ULCER ONLY WITH PLACEMENT OF A CELL AND VAC/ DRESSING CHANGE TO UPPER BACK AREA.;  Surgeon: Theodoro Kos, DO;  Location: [place];  Service: Plastics;  Laterality: N/A;  . Incision and drainage of wound N/A 11/25/2014    Procedure: IRRIGATION AND DEBRIDEMENT OF SACRAL ULCER AND BACK BURN WITH PLACEMENT OF A-CELL;  Surgeon: Theodoro Kos, DO;  Location: [place];  Service:  Plastics;  Laterality: N/A;  . Minor application of wound vac N/A 11/25/2014    Procedure:  WOUND VAC CHANGE;  Surgeon: Theodoro Kos, DO;  Location: [place];  Service: Plastics;  Laterality: N/A;  . Incision and drainage of wound Right 12/08/2014    Procedure: IRRIGATION AND DEBRIDEMENT SACRAL WOUND AND RIGHT ISCHIAL WOUND ;  Surgeon: Theodoro Kos, DO;  Location: [place];  Service: Plastics;  Laterality: Right;  . Application of a-cell of back  N/A 12/08/2014    Procedure: APPLICATION OF A-CELL AND WOUND VAC ;  Surgeon: Theodoro Kos, DO;  Location: [place];  Service: Plastics;  Laterality: N/A;   Family History  Problem Relation Age of Onset  . Diabetes Father   . Hypertension Father   . Asthma      fhx  . Hypertension      fhx  . Stroke      fhx  . Heart disease Mother    History  Substance Use Topics  . Smoking status: Former Smoker -- 1.00 packs/day for 12 years    Types: Cigars, Cigarettes    Quit date: 09/07/2014  . Smokeless tobacco: Never Used  . Alcohol Use: No    Review of Systems  Constitutional: Negative for fever and chills.  Respiratory: Negative for shortness of breath.   Cardiovascular: Negative for chest pain.  Gastrointestinal: Negative for nausea, vomiting, diarrhea and constipation.  Genitourinary: Negative for dysuria.  All other systems reviewed and are negative.     Allergies  Cefuroxime axetil; Ertapenem; Morphine and related; Penicillins; Sulfa antibiotics; Tessalon; Shellfish allergy; and Miripirium  Home Medications   Prior to Admission medications   Medication Sig Start Date End Date Taking? Authorizing Provider  acetaminophen (TYLENOL) 325 MG tablet Take 2 tablets (650 mg total) by mouth every 6 (six) hours as needed for mild pain, moderate pain, fever or headache. Patient not taking: Reported on 03/30/2015 01/14/15   Geradine Girt, DO  albuterol (PROVENTIL) (2.5 MG/3ML) 0.083% nebulizer solution Take 3 mLs (2.5 mg total) by nebulization every 4 (four) hours as needed for wheezing or shortness of breath. 04/27/14   [name], MD  apixaban (ELIQUIS) 5 MG TABS tablet Take 2 tablets (10 mg total) by mouth 2 (two) times daily. 03/05/15 03/09/15  Velvet Bathe, MD  apixaban (ELIQUIS) 5 MG TABS tablet Take 1 tablet (5 mg total) by mouth 2 (two) times daily. 03/10/15   Velvet Bathe, MD  aztreonam 2 g in dextrose 5 % 50 mL Inject 2 g into the vein every 8 (eight) hours. Patient not taking:  Reported on 03/30/2015 03/05/15   Velvet Bathe, MD  B Complex-Folic Acid (B COMPLEX-VITAMIN B12 PO) Take 1 tablet by mouth at bedtime.    Historical Provider, MD  baclofen (LIORESAL) 10 MG tablet Take 1-2 tablets (10-20 mg total) by mouth 3 (three) times daily. Takes 10mg  in morning and lunchtime and takes 20mg  at bedtime 11/29/14   Meredith Staggers, MD  ciprofloxacin (CIPRO) 500 MG tablet Take 1 tablet (500 mg total) by mouth 2 (two) times daily. One po bid x 7 days 03/30/15   Sherwood Gambler, MD  collagenase (SANTYL) ointment Apply 1 application topically daily. 08/30/14   [name], MD  diclofenac sodium (VOLTAREN) 1 % GEL Apply 2 g topically daily as needed (for pain). Patient not taking: Reported on 03/30/2015 11/19/14   Jonetta Osgood, MD  feeding supplement, GLUCERNA SHAKE, (GLUCERNA SHAKE) LIQD Take 237 mLs by mouth 3 (three)  times daily between meals. 12/10/14   Florencia Reasons, MD  fentaNYL (DURAGESIC - DOSED MCG/HR) 25 MCG/HR patch Place 1 patch (25 mcg total) onto the skin every 3 (three) days. 03/15/15   Bayard Hugger, NP  glucagon (GLUCAGON EMERGENCY) 1 MG injection Inject 1 mg into the vein once as needed. 04/20/14   [name], MD  glucose blood (ACCU-CHEK SMARTVIEW) test strip Use to check blood sugar 4 times daily. 01/20/15   Renato Shin, MD  ibuprofen (ADVIL,MOTRIN) 200 MG tablet Take 400 mg by mouth every 4 (four) hours as needed for fever (fever).     Historical Provider, MD  insulin aspart (NOVOLOG) 100 UNIT/ML injection Inject 5 Units into the skin 3 (three) times daily with meals. Patient taking differently: Inject 4-5 Units into the skin 3 (three) times daily with meals.  01/14/15   Geradine Girt, DO  insulin glargine (LANTUS) 100 UNIT/ML injection Inject 0.3 mLs (30 Units total) into the skin at bedtime. Patient taking differently: Inject 20 Units into the skin at bedtime.  03/05/15   Velvet Bathe, MD  loratadine (CLARITIN) 10 MG tablet Take 10 mg by mouth at bedtime.     Historical  Provider, MD  metoCLOPramide (REGLAN) 5 MG tablet TK 1 T PO  TWICE A WEEK PRF NAUSEA 03/14/15   Historical Provider, MD  metroNIDAZOLE (FLAGYL) 500 MG tablet Take 1 tablet (500 mg total) by mouth every 8 (eight) hours. Patient not taking: Reported on 03/30/2015 03/05/15   Velvet Bathe, MD  midodrine (PROAMATINE) 10 MG tablet Take 1 tablet (10 mg total) by mouth 3 (three) times daily with meals. 03/05/15   Velvet Bathe, MD  mirtazapine (REMERON) 7.5 MG tablet Take 1 tablet (7.5 mg total) by mouth at bedtime. Patient not taking: Reported on 03/30/2015 03/05/15   Velvet Bathe, MD  omeprazole (PRILOSEC) 40 MG capsule Take 40 mg by mouth at bedtime.     Historical Provider, MD  ondansetron (ZOFRAN ODT) 8 MG disintegrating tablet Take 1 tablet (8 mg total) by mouth every 8 (eight) hours as needed for nausea or vomiting. 12/29/14   [name], MD  Oxycodone HCl 10 MG TABS Take 1 tablet (10 mg total) by mouth every 6 (six) hours as needed. 03/21/15   Bayard Hugger, NP  pregabalin (LYRICA) 100 MG capsule Take 1 capsule (100 mg total) by mouth 3 (three) times daily. Take 100 mg by mouth in the morning, take 100 mg by mouth in the afternoon, take 100 mg by mouth in the evening and take 200 mg by mouth at bedtime. 03/05/15   Velvet Bathe, MD  Vancomycin (VANCOCIN) 750 MG/150ML SOLN Inject 150 mLs (750 mg total) into the vein every 12 (twelve) hours. Patient not taking: Reported on 03/30/2015 03/05/15   Velvet Bathe, MD   BP 116/76 mmHg  Pulse 57  Temp(Src) 99.5 F (37.5 C) (Oral)  Resp 20  Ht 5\' 7"  (1.702 m)  Wt 130 lb (58.968 kg)  BMI 20.36 kg/m2  SpO2 99% Physical Exam  Constitutional: He is oriented to person, place, and time. He appears well-developed and well-nourished.  HENT:  Head: Normocephalic and atraumatic.  Eyes: Conjunctivae and EOM are normal. Pupils are equal, round, and reactive to light. Right eye exhibits no discharge. Left eye exhibits no discharge. No scleral icterus.  Neck: Normal range  of motion. Neck supple. No JVD present.  Cardiovascular: Normal rate, regular rhythm and normal heart sounds.  Exam reveals no gallop and no  friction rub.   No murmur heard. Pulmonary/Chest: Effort normal and breath sounds normal. No respiratory distress. He has no wheezes. He has no rales. He exhibits no tenderness.  Abdominal: Soft. He exhibits no distension and no mass. There is no tenderness. There is no rebound and no guarding.  Suprapubic catheter, scant discharge Left sided colostomy  Musculoskeletal: Normal range of motion. He exhibits no edema or tenderness.  Neurological: He is alert and oriented to person, place, and time.  Skin: Skin is warm and dry.  Decubitus ulcers of back, ischium, and sacral region  Psychiatric: He has a normal mood and affect. His behavior is normal. Judgment and thought content normal.  Nursing note and vitals reviewed.   ED Course  Procedures (including critical care time) Labs Review Labs Reviewed - No data to display  Imaging Review Dg Chest Port 1 View  03/30/2015   CLINICAL DATA:  Fever, quadriplegia  EXAM: PORTABLE CHEST - 1 VIEW  COMPARISON:  02/20/2015  FINDINGS: Cardiomediastinal silhouette is stable. Right arm PICC line with tip in SVC right atrium junction. No pneumothorax. No acute infiltrate or pulmonary edema. Old fracture of the right clavicle again noted.  IMPRESSION: No active disease.   Electronically Signed   By: Lahoma Crocker M.D.   On: 03/30/2015 20:47     EKG Interpretation None      MDM   Final diagnoses:  Abnormal laboratory test    Patient sent to emergency department with report of one positive blood culture. Patient states that he is feeling fine. He is taking Cipro for a urinary tract infection. Vital signs are stable in the emergency department. He is otherwise well-appearing. Urine culture reviewed, and and shows resistance to Cipro, will switch to Macrobid. Patient discussed with Dr. Linus Salmons of ID, who believes that  cultures are likely contaminant, and states that based on trending down WBC and stable vitals it is appropriate for the patient to be discharged with strict precautions.  Patient discussed with Dr. Tawnya Crook, who also agrees with this plan.    Montine Circle, PA-C 04/01/15 2051  Ernestina Patches, MD 04/02/15 1128

## 2015-04-01 NOTE — ED Notes (Signed)
Lab called this Probation officer, informing that the pt has had positive gram + cocci and clusters in blood cultures.  Dr. Leonides Schanz notified.  Spoke with pt's mother who will be bringing patient in to be admitted.

## 2015-04-01 NOTE — ED Notes (Addendum)
Per ems-- pt sent from pcp for abnormal labs- possible infection. Pt denies any complaints. No fevers/chills. Pt had PICC line removed yesterday. PICC line was in place for IV antibiotics.

## 2015-04-01 NOTE — ED Notes (Signed)
Family member also states that the drainage on the back wound is "normal" that the silvadene cream looks "purulent" when it needs to be changed. Pt has no complaints at this time, denies fever chills or pain.

## 2015-04-01 NOTE — ED Notes (Signed)
Lab called this writer to inform of positive gram + cocci and clusters on blood cultures.  Dr. Leonides Schanz informed.  Spoke with pt's mother who has agreed to bring patient back in to be admitted.

## 2015-04-02 ENCOUNTER — Telehealth: Payer: Self-pay | Admitting: Internal Medicine

## 2015-04-02 NOTE — Telephone Encounter (Signed)
Discussed case with ED last night.  Came in to ED with [facility]PC in 1 of 2 blood cultures and feeling fine so sent out to see if contaminate or not.   Unfortuantely, his blood cultures is now 1/2 with Staph aureus.  He will need to come to the hospital for admission for IV vancomycin and proceed with long term IV antibiotics.   I attempted to call the patient at both numbers listed and left a message to come to [facility] for admission.  I also left my contact information.

## 2015-04-03 ENCOUNTER — Telehealth (HOSPITAL_COMMUNITY): Payer: Self-pay

## 2015-04-03 NOTE — Telephone Encounter (Signed)
Post ED Visit - Positive Culture Follow-up  Culture report reviewed by antimicrobial stewardship pharmacist: []  Wes Dulaney, Pharm.D., BCPS []  Heide Guile, Pharm.D., BCPS []  Alycia Rossetti, Pharm.D., BCPS []  [place], Pharm.D., BCPS, AAHIVP []  Legrand Como, Pharm.D., BCPS, AAHIVP []  Isac Sarna, Pharm.D., BCPS X  Elenor Quinones, Pharm D  Positive Urine culture, >/= 100,000 colonies -> E Coli Treated with Ciprofloxacin, organism sensitive to the same and no further patient follow-up is required at this time.  Dortha Kern 04/03/2015, 5:20 AM

## 2015-04-04 ENCOUNTER — Other Ambulatory Visit: Payer: Self-pay | Admitting: Endocrinology

## 2015-04-04 LAB — CULTURE, BLOOD (ROUTINE X 2): Culture: NO GROWTH

## 2015-04-04 MED ORDER — MIRTAZAPINE 7.5 MG PO TABS: 7.5000 mg | ORAL_TABLET | Freq: Every day | ORAL | Status: DC

## 2015-04-04 MED ORDER — MIDODRINE HCL 10 MG PO TABS: 10.0000 mg | ORAL_TABLET | Freq: Three times a day (TID) | ORAL | Status: DC

## 2015-04-04 NOTE — Telephone Encounter (Signed)
Refill both for one year. 

## 2015-04-04 NOTE — Telephone Encounter (Signed)
rx sent in electronically 

## 2015-04-05 LAB — CULTURE, BLOOD (ROUTINE X 2)

## 2015-04-06 ENCOUNTER — Encounter (HOSPITAL_BASED_OUTPATIENT_CLINIC_OR_DEPARTMENT_OTHER): Payer: Medicaid Other | Attending: Surgery

## 2015-04-06 DIAGNOSIS — E1043 Type 1 diabetes mellitus with diabetic autonomic (poly)neuropathy: Secondary | ICD-10-CM | POA: Insufficient documentation

## 2015-04-06 DIAGNOSIS — M869 Osteomyelitis, unspecified: Secondary | ICD-10-CM | POA: Diagnosis not present

## 2015-04-06 DIAGNOSIS — J45909 Unspecified asthma, uncomplicated: Secondary | ICD-10-CM | POA: Diagnosis not present

## 2015-04-06 DIAGNOSIS — Z79899 Other long term (current) drug therapy: Secondary | ICD-10-CM | POA: Diagnosis not present

## 2015-04-06 DIAGNOSIS — K3184 Gastroparesis: Secondary | ICD-10-CM | POA: Diagnosis not present

## 2015-04-06 DIAGNOSIS — Z794 Long term (current) use of insulin: Secondary | ICD-10-CM | POA: Diagnosis not present

## 2015-04-06 DIAGNOSIS — E10319 Type 1 diabetes mellitus with unspecified diabetic retinopathy without macular edema: Secondary | ICD-10-CM | POA: Insufficient documentation

## 2015-04-06 DIAGNOSIS — Z792 Long term (current) use of antibiotics: Secondary | ICD-10-CM | POA: Diagnosis not present

## 2015-04-06 DIAGNOSIS — K219 Gastro-esophageal reflux disease without esophagitis: Secondary | ICD-10-CM | POA: Diagnosis not present

## 2015-04-06 DIAGNOSIS — L89154 Pressure ulcer of sacral region, stage 4: Secondary | ICD-10-CM | POA: Insufficient documentation

## 2015-04-06 DIAGNOSIS — L89102 Pressure ulcer of unspecified part of back, stage 2: Secondary | ICD-10-CM | POA: Insufficient documentation

## 2015-04-06 DIAGNOSIS — L89324 Pressure ulcer of left buttock, stage 4: Secondary | ICD-10-CM | POA: Diagnosis not present

## 2015-04-06 DIAGNOSIS — Z8614 Personal history of Methicillin resistant Staphylococcus aureus infection: Secondary | ICD-10-CM | POA: Diagnosis not present

## 2015-04-06 DIAGNOSIS — L89892 Pressure ulcer of other site, stage 2: Secondary | ICD-10-CM | POA: Diagnosis not present

## 2015-04-06 DIAGNOSIS — M797 Fibromyalgia: Secondary | ICD-10-CM | POA: Diagnosis not present

## 2015-04-06 DIAGNOSIS — E10621 Type 1 diabetes mellitus with foot ulcer: Secondary | ICD-10-CM | POA: Diagnosis not present

## 2015-04-06 DIAGNOSIS — Z933 Colostomy status: Secondary | ICD-10-CM | POA: Insufficient documentation

## 2015-04-06 DIAGNOSIS — Z9350 Unspecified cystostomy status: Secondary | ICD-10-CM | POA: Diagnosis not present

## 2015-04-06 DIAGNOSIS — G40909 Epilepsy, unspecified, not intractable, without status epilepticus: Secondary | ICD-10-CM | POA: Insufficient documentation

## 2015-04-06 DIAGNOSIS — Z86711 Personal history of pulmonary embolism: Secondary | ICD-10-CM | POA: Diagnosis not present

## 2015-04-06 DIAGNOSIS — Z79891 Long term (current) use of opiate analgesic: Secondary | ICD-10-CM | POA: Insufficient documentation

## 2015-04-06 DIAGNOSIS — G8250 Quadriplegia, unspecified: Secondary | ICD-10-CM | POA: Insufficient documentation

## 2015-04-06 DIAGNOSIS — L89620 Pressure ulcer of left heel, unstageable: Secondary | ICD-10-CM | POA: Diagnosis not present

## 2015-04-08 ENCOUNTER — Encounter: Payer: Self-pay | Admitting: Family Medicine

## 2015-04-08 ENCOUNTER — Ambulatory Visit (INDEPENDENT_AMBULATORY_CARE_PROVIDER_SITE_OTHER): Payer: Medicaid Other | Admitting: Family Medicine

## 2015-04-08 VITALS — BP 92/64 | HR 89 | Temp 99.0°F

## 2015-04-08 DIAGNOSIS — N390 Urinary tract infection, site not specified: Secondary | ICD-10-CM | POA: Diagnosis not present

## 2015-04-08 DIAGNOSIS — L03119 Cellulitis of unspecified part of limb: Secondary | ICD-10-CM

## 2015-04-08 DIAGNOSIS — M869 Osteomyelitis, unspecified: Secondary | ICD-10-CM

## 2015-04-08 DIAGNOSIS — L02419 Cutaneous abscess of limb, unspecified: Secondary | ICD-10-CM

## 2015-04-08 DIAGNOSIS — I2699 Other pulmonary embolism without acute cor pulmonale: Secondary | ICD-10-CM | POA: Diagnosis not present

## 2015-04-08 DIAGNOSIS — M533 Sacrococcygeal disorders, not elsewhere classified: Secondary | ICD-10-CM | POA: Diagnosis not present

## 2015-04-08 IMAGING — CR DG FOOT COMPLETE 3+V*R*
3 series · 3 of 3 positions shown · non-contrast
Comparison: None available.

CLINICAL DATA: Right third to do ecchymosis.

RIGHT FOOT COMPLETE - 3+ VIEW

[t foot ap right]
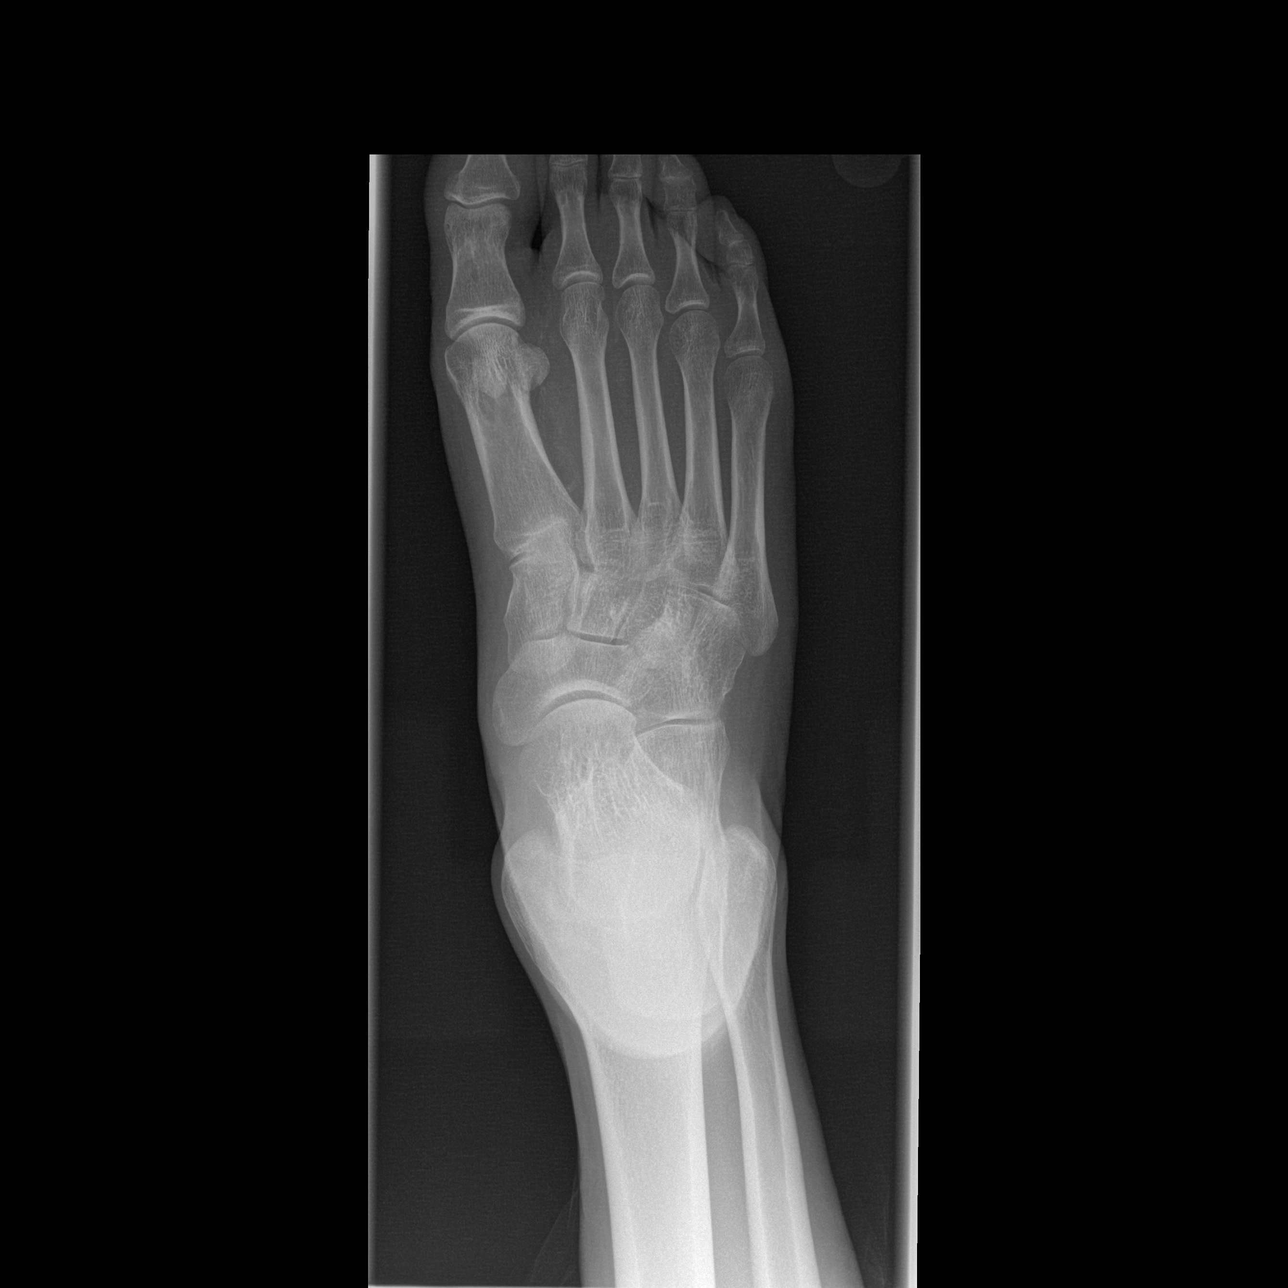

[t foot oblique right]
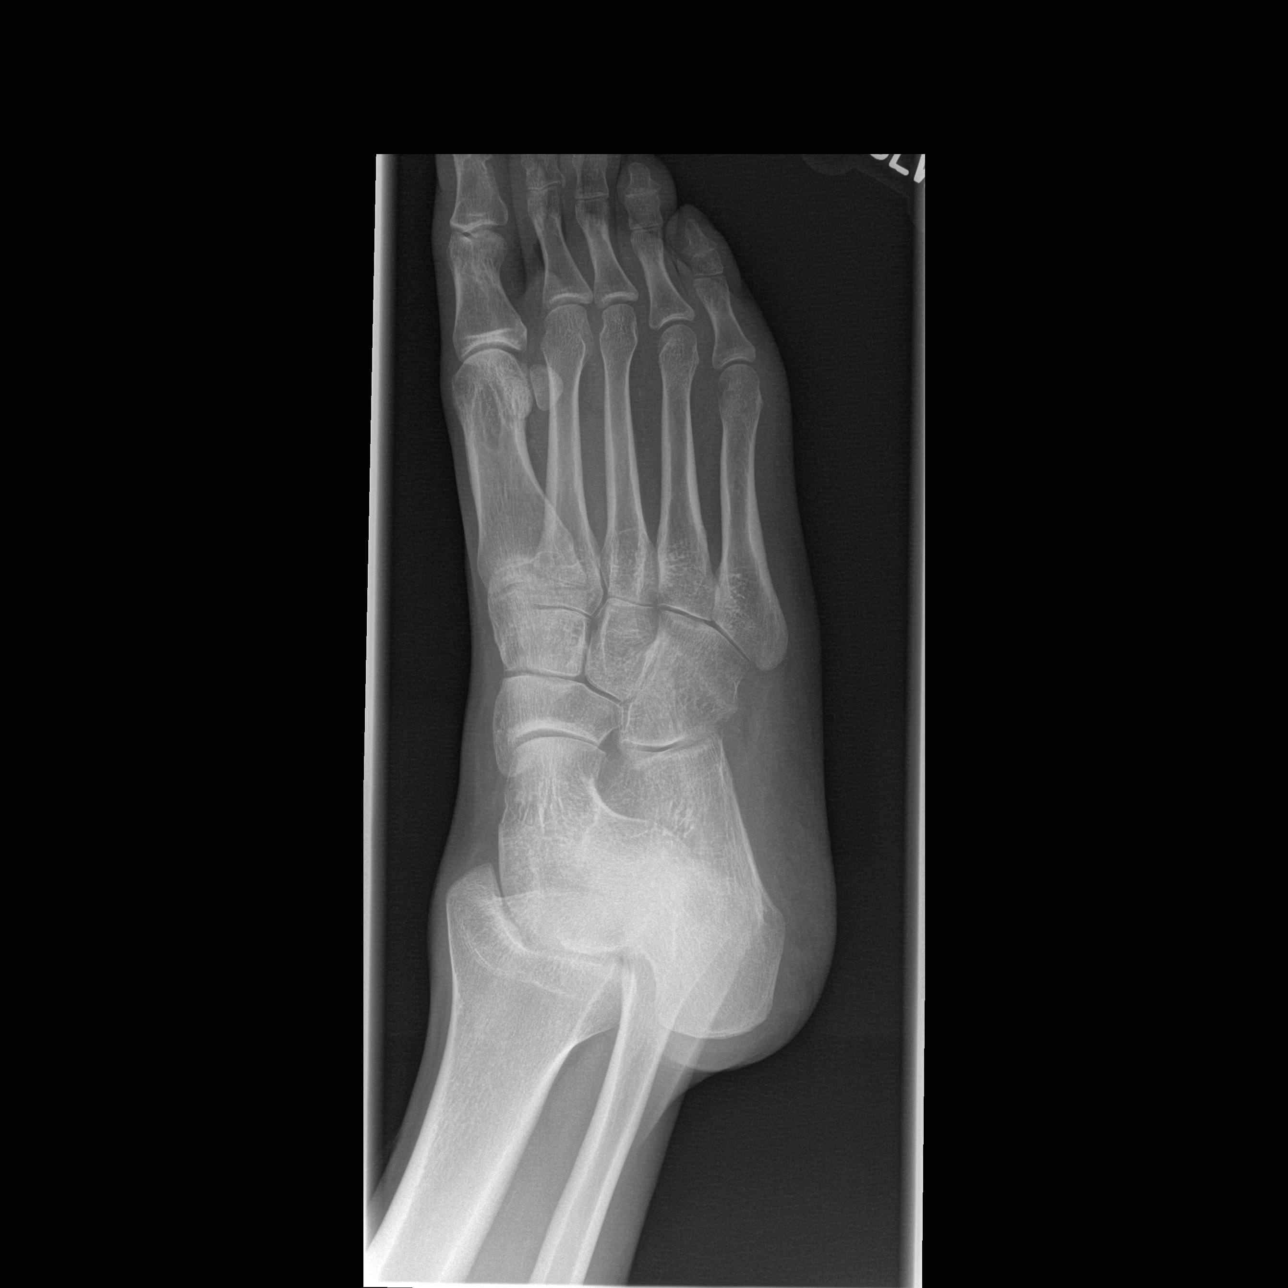

[t foot lat right]
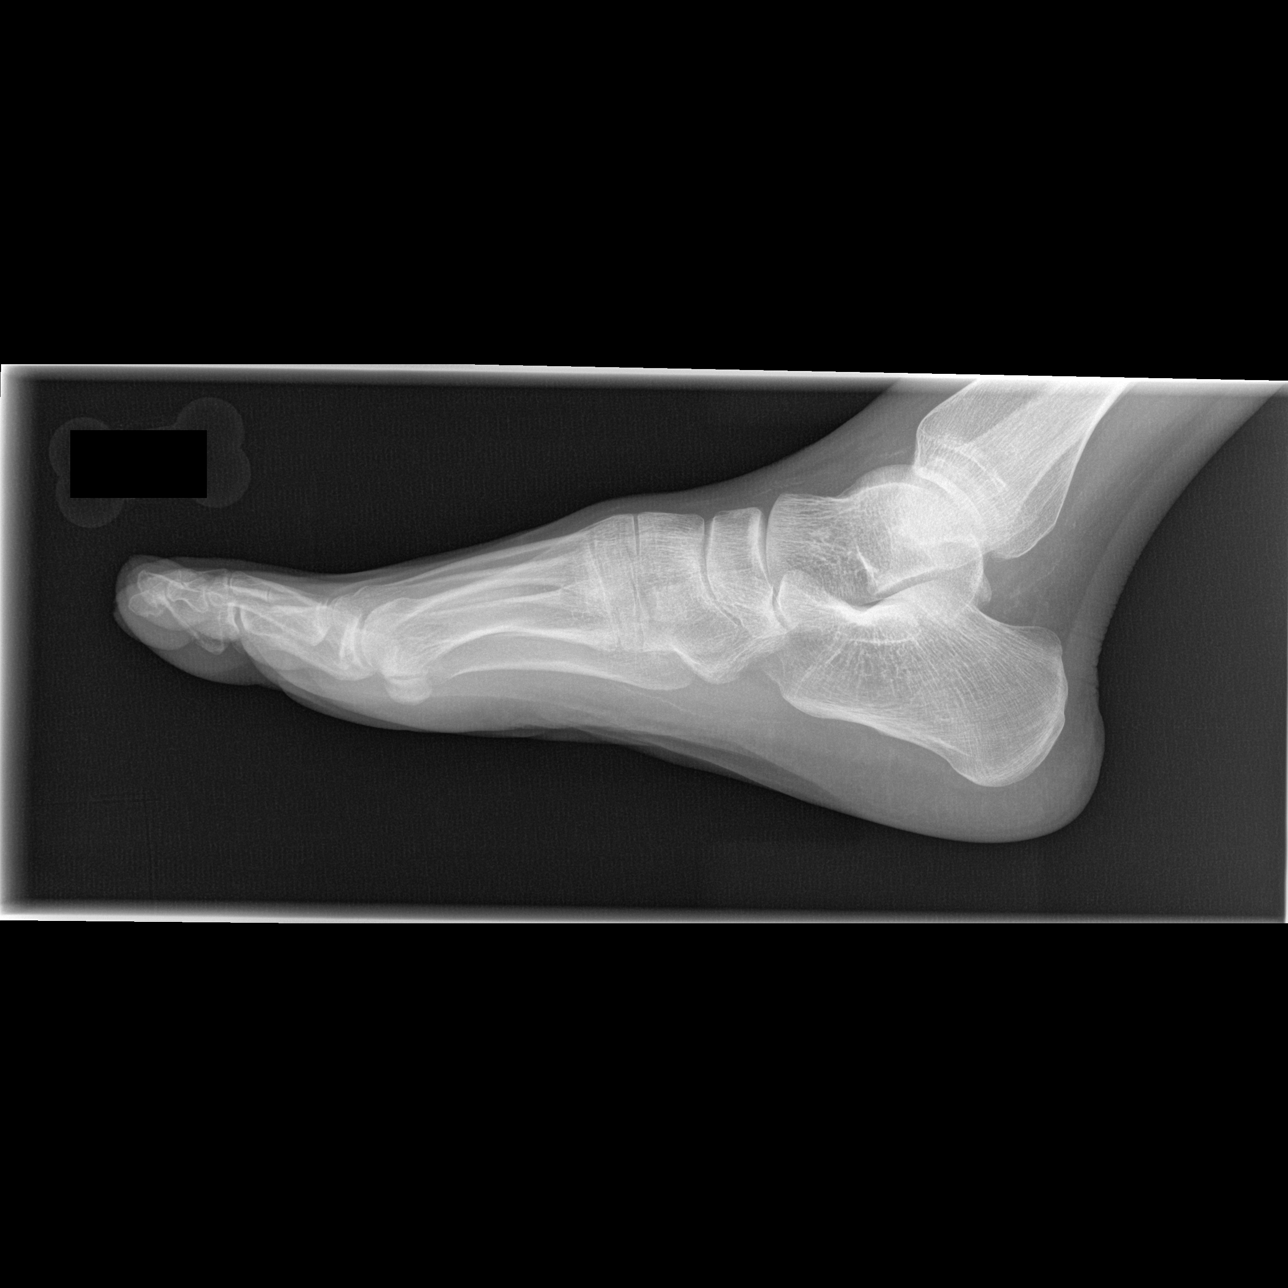

[3 of 3 positions shown; findings below may reference images not displayed]

FINDINGS: No acute bone or soft tissue abnormalities are present.
Small vessel calcifications are compatible with diabetes.  No
significant osseous abnormality is evident within the third digit.
IMPRESSION: 1.  No acute abnormality.
2.  Small vessel calcifications compatible with diabetes.

## 2015-04-08 MED ORDER — METOCLOPRAMIDE HCL 5 MG PO TABS: ORAL_TABLET | ORAL | Status: DC

## 2015-04-08 NOTE — Progress Notes (Signed)
   Subjective:    Patient ID: Jason [name], male    DOB: 10-16-1985, 29 y.o.   MRN: [id number]  HPI Here to follow up on a UTI. He was seen in the ER on 03-30-15 and was started on Cipro for the UTI. He also had blood cultures drawn then, one of which grew MRSA. When this was discovered he was told to go back to the ER and he was seen there on 04-01-15. Infectious Disease was consultued and they determined that the blood culture simply reflected contaminants. His urine culture grew an E coli tat was resistant to Cipro, so this was changed to [place]. After that he has done well. He has lost a lot of weight so his mother is trying to maximize his intake of carbs and proteins. He continues to see the [facility] regularly.    Review of Systems  Respiratory: Negative.   Cardiovascular: Negative.   Gastrointestinal: Negative.   Endocrine: Negative.   Genitourinary: Negative.   Neurological: Positive for weakness. Negative for tremors, seizures, syncope and speech difficulty.       Objective:   Physical Exam  Constitutional: He is oriented to person, place, and time.  In his wheelchair, quite thin and frail   Neck: Neck supple. No thyromegaly present.  Cardiovascular: Normal rate, regular rhythm, normal heart sounds and intact distal pulses.   Pulmonary/Chest: Effort normal.  Abdominal: Soft. Bowel sounds are normal. He exhibits no distension and no mass. There is no tenderness. There is no rebound and no guarding.  Lymphadenopathy:    He has no cervical adenopathy.  Neurological: He is alert and oriented to person, place, and time.  Psychiatric: He has a normal mood and affect.          Assessment & Plan:  His is recovering from the latest bout of UTI and urosepsis. Finish out the [employer]. They will work on is diet and try to get him to gain some weight. Follow up with the [facility]

## 2015-04-08 NOTE — Progress Notes (Signed)
Pre visit review using our clinic review tool, if applicable. No additional management support is needed unless otherwise documented below in the visit note. Pt unable to stand and weigh.   

## 2015-04-09 IMAGING — MR MR FOOT*R* WO/W CM
4 of 9 series · 19 of 40 positions shown · IV contrast (multihance)
Comparison: Radiographs dated 01/28/2013

CLINICAL DATA: Pain, swelling, and discoloration of the right third
toe.

MRI OF THE RIGHT FOREFOOT WITHOUT AND WITH CONTRAST
TECHNIQUE: Multiplanar, multisequence MR imaging was performed
both before and after administration of intravenous contrast.
Contrast: 10mL MULTIHANCE GADOBENATE DIMEGLUMINE 529 MG/ML IV SOLN

[Series 3: T1 · coronal · 3.0mm · 0.27mm/px · 6 of 24 slices shown (1 of 2)]
[im 1/24]
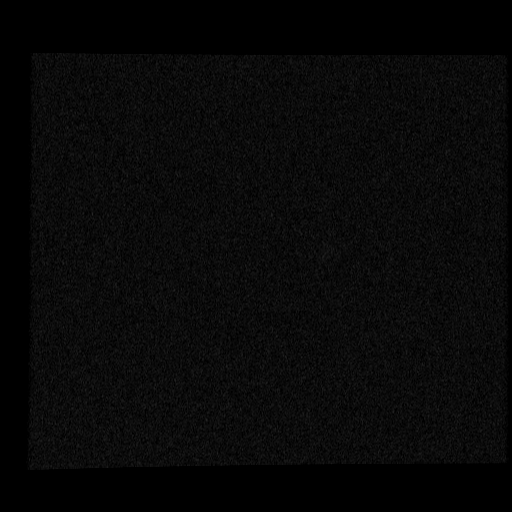
[im 5/24]
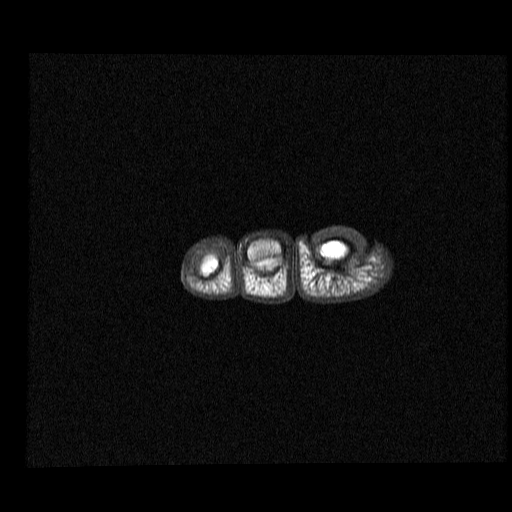
[im 10/24]
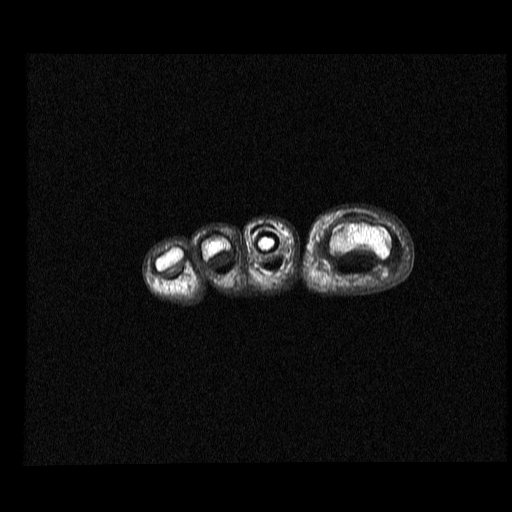
[im 14/24]
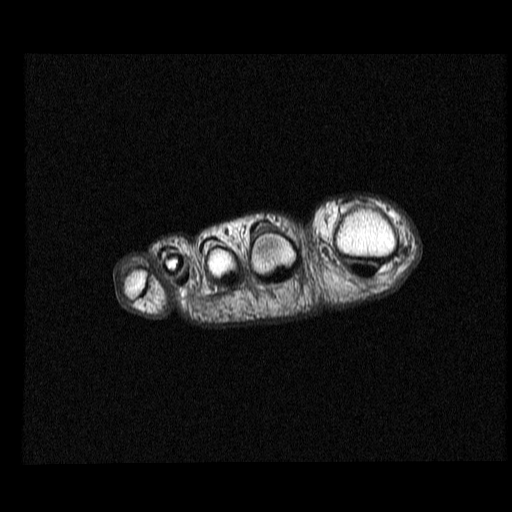
[im 19/24]
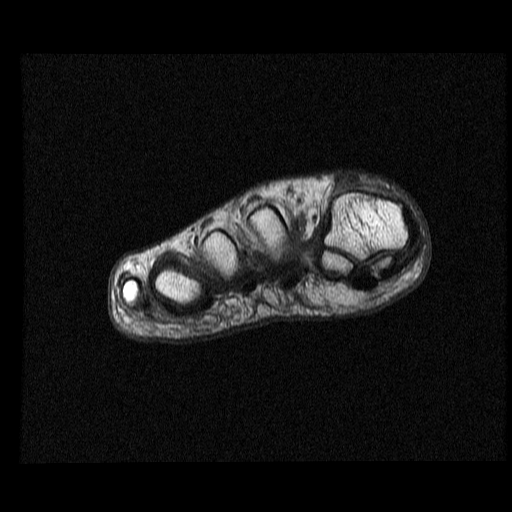
[im 24/24]
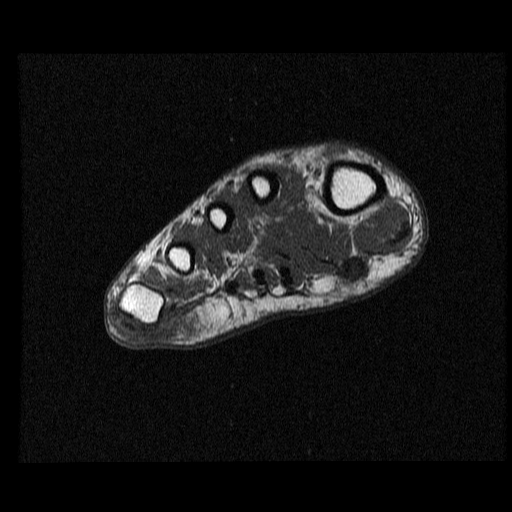

[Series 4: T2 fat-sat · coronal · 3.0mm · 0.27mm/px · 5 of 24 slices shown]
[im 1/24]
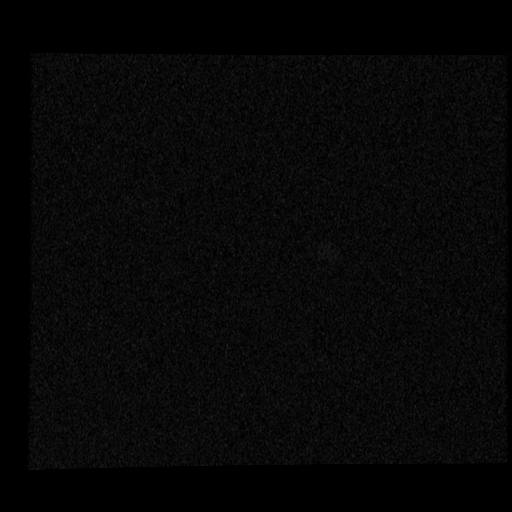
[im 6/24]
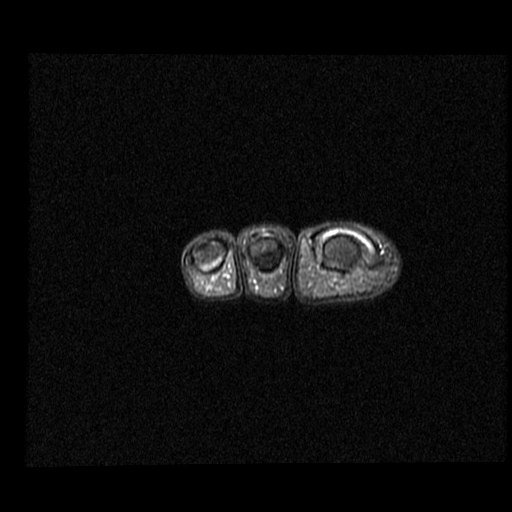
[im 12/24]
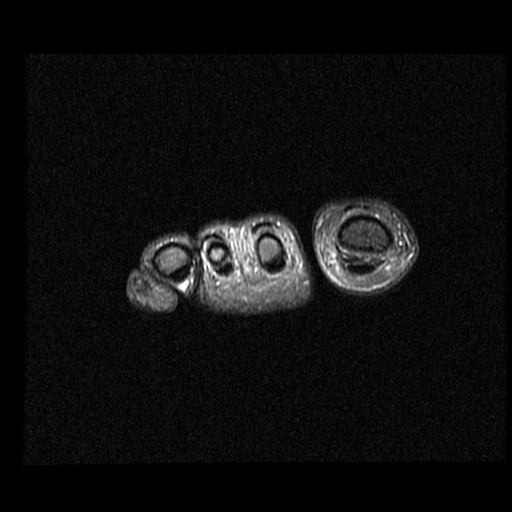
[im 18/24]
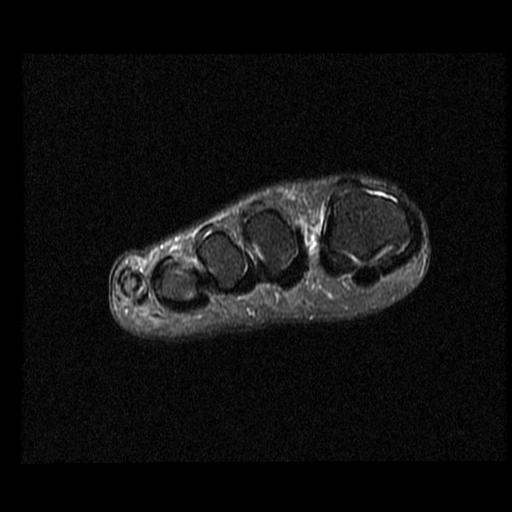
[im 24/24]
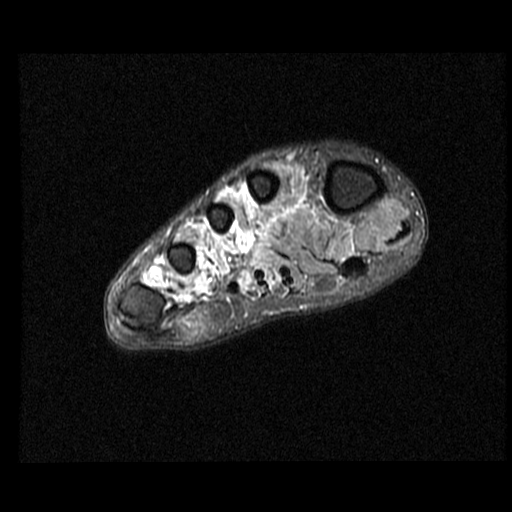

[Series 5: T1 · axial · 3.0mm · 0.27mm/px · z∈[-42,+2]mm · 3 of 12 slices shown (2 of 2)]
[im 1/12]
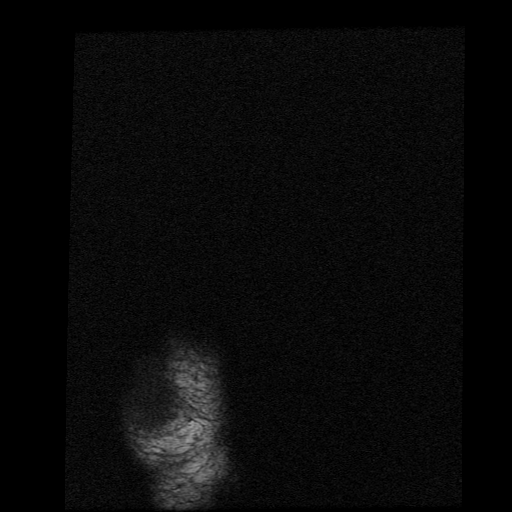
[im 6/12]
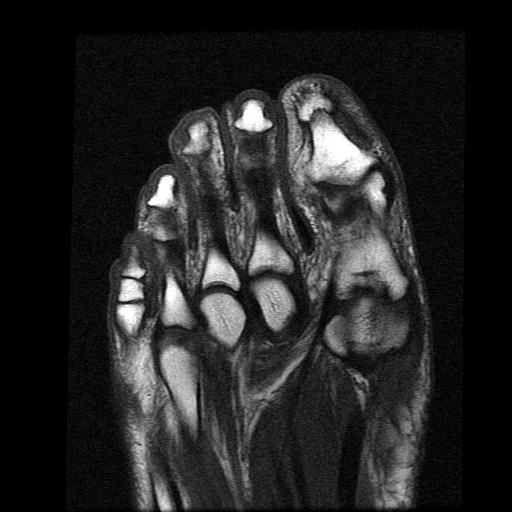
[im 12/12]
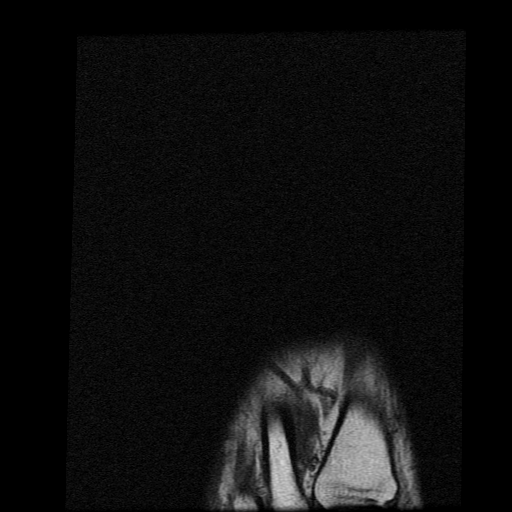

[Series 8: T1 fat-sat post-contrast · coronal · non-contrast · 3.0mm · 0.27mm/px · 5 of 24 slices shown]
[im 1/24]
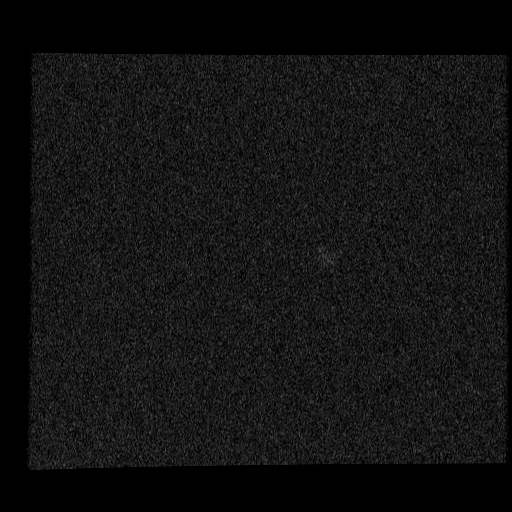
[im 6/24]
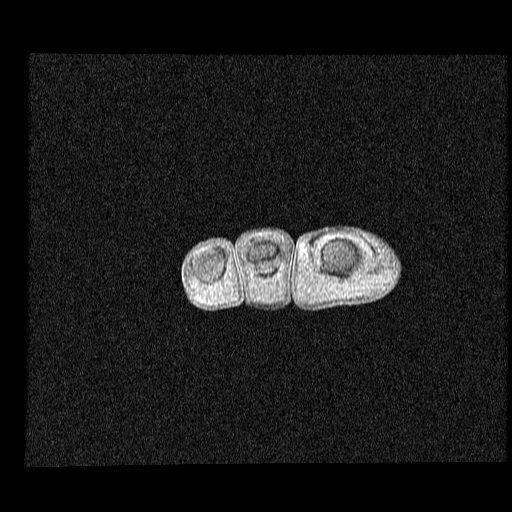
[im 12/24]
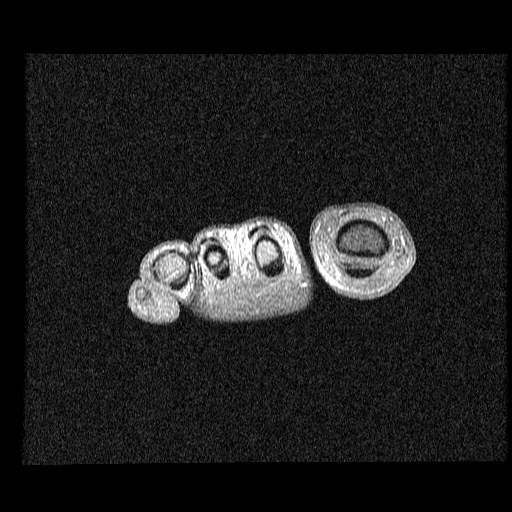
[im 18/24]
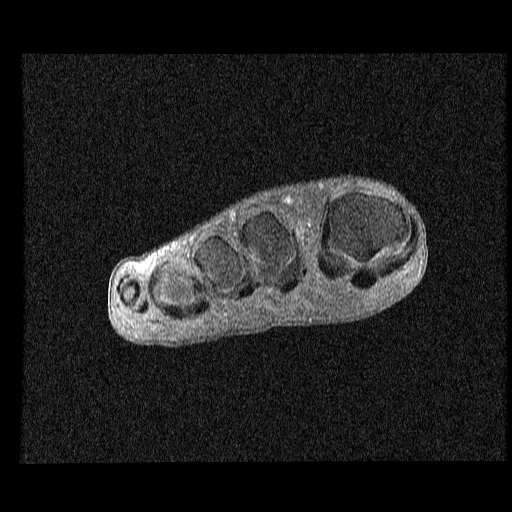
[im 24/24]
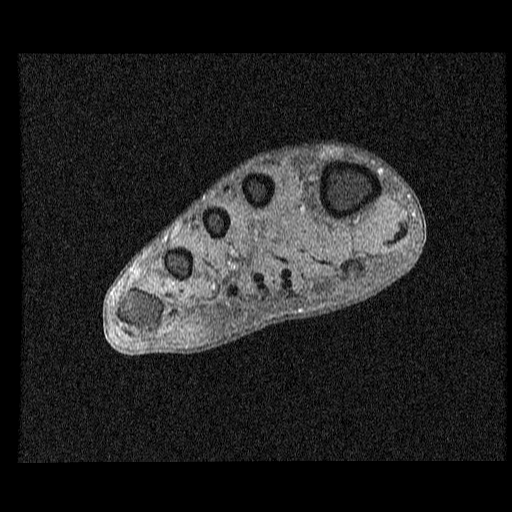

[19 of 40 positions shown; findings below may reference images not displayed]

FINDINGS: There is no bone destruction. There is edema in the
distal phalangeal bone of the third toe and there is slight
enhancement of that bone after contrast.

The other visualized bones of the forefoot are normal.  There is
slight enhancement of the soft tissues at the tip of the third toe.
No visible ulcer.
IMPRESSION: 1.  Cellulitis of the tip of the third toe.
2.  Slight edema and subtle enhancement of the distal phalangeal
bone of the third toe which could be due to hyperemia due to the
adjacent cellulitis.  There is no bone destruction at this time. No
findings definitive for osteomyelitis.

## 2015-04-13 ENCOUNTER — Telehealth: Payer: Self-pay | Admitting: Family Medicine

## 2015-04-13 NOTE — Telephone Encounter (Signed)
Pt needs refill on eliquis 5 mg twice a day #180 for 90 day supply w/refills send to [facility]. This med was prescribed by hospital md.

## 2015-04-13 NOTE — Telephone Encounter (Signed)
Refill this for one year  

## 2015-04-14 MED ORDER — APIXABAN 5 MG PO TABS: 5.0000 mg | ORAL_TABLET | Freq: Two times a day (BID) | ORAL | Status: DC

## 2015-04-14 NOTE — Telephone Encounter (Signed)
I sent script e-scribe. 

## 2015-04-19 ENCOUNTER — Encounter: Payer: Self-pay | Admitting: Infectious Disease

## 2015-04-25 ENCOUNTER — Encounter: Payer: Medicaid Other | Attending: Registered Nurse | Admitting: Registered Nurse

## 2015-04-25 ENCOUNTER — Encounter (HOSPITAL_BASED_OUTPATIENT_CLINIC_OR_DEPARTMENT_OTHER): Payer: Medicaid Other | Attending: Plastic Surgery

## 2015-04-25 ENCOUNTER — Emergency Department (HOSPITAL_COMMUNITY): Payer: Medicaid Other

## 2015-04-25 ENCOUNTER — Inpatient Hospital Stay (HOSPITAL_COMMUNITY)
Admission: EM | Admit: 2015-04-25 | Discharge: 2015-06-01 | DRG: 003 | Disposition: A | Discharge status: Home Health | Payer: Medicaid Other | Attending: Internal Medicine | Admitting: Internal Medicine

## 2015-04-25 ENCOUNTER — Encounter: Payer: Self-pay | Admitting: Registered Nurse

## 2015-04-25 ENCOUNTER — Telehealth: Payer: Self-pay | Admitting: Family Medicine

## 2015-04-25 ENCOUNTER — Encounter (HOSPITAL_COMMUNITY): Payer: Self-pay | Admitting: *Deleted

## 2015-04-25 VITALS — BP 75/53 | HR 74 | Resp 14

## 2015-04-25 DIAGNOSIS — R627 Adult failure to thrive: Secondary | ICD-10-CM | POA: Diagnosis present

## 2015-04-25 DIAGNOSIS — M4628 Osteomyelitis of vertebra, sacral and sacrococcygeal region: Secondary | ICD-10-CM | POA: Diagnosis present

## 2015-04-25 DIAGNOSIS — E1065 Type 1 diabetes mellitus with hyperglycemia: Secondary | ICD-10-CM | POA: Diagnosis present

## 2015-04-25 DIAGNOSIS — Z79899 Other long term (current) drug therapy: Secondary | ICD-10-CM | POA: Insufficient documentation

## 2015-04-25 DIAGNOSIS — I82409 Acute embolism and thrombosis of unspecified deep veins of unspecified lower extremity: Secondary | ICD-10-CM | POA: Insufficient documentation

## 2015-04-25 DIAGNOSIS — L89159 Pressure ulcer of sacral region, unspecified stage: Secondary | ICD-10-CM

## 2015-04-25 DIAGNOSIS — K651 Peritoneal abscess: Secondary | ICD-10-CM

## 2015-04-25 DIAGNOSIS — B9620 Unspecified Escherichia coli [E. coli] as the cause of diseases classified elsewhere: Secondary | ICD-10-CM | POA: Diagnosis present

## 2015-04-25 DIAGNOSIS — G8250 Quadriplegia, unspecified: Secondary | ICD-10-CM | POA: Diagnosis not present

## 2015-04-25 DIAGNOSIS — A419 Sepsis, unspecified organism: Secondary | ICD-10-CM | POA: Diagnosis not present

## 2015-04-25 DIAGNOSIS — Z9289 Personal history of other medical treatment: Secondary | ICD-10-CM

## 2015-04-25 DIAGNOSIS — J189 Pneumonia, unspecified organism: Secondary | ICD-10-CM | POA: Diagnosis present

## 2015-04-25 DIAGNOSIS — I2699 Other pulmonary embolism without acute cor pulmonale: Secondary | ICD-10-CM

## 2015-04-25 DIAGNOSIS — M869 Osteomyelitis, unspecified: Secondary | ICD-10-CM | POA: Diagnosis not present

## 2015-04-25 DIAGNOSIS — J9819 Other pulmonary collapse: Secondary | ICD-10-CM | POA: Insufficient documentation

## 2015-04-25 DIAGNOSIS — E10649 Type 1 diabetes mellitus with hypoglycemia without coma: Secondary | ICD-10-CM | POA: Diagnosis present

## 2015-04-25 DIAGNOSIS — J156 Pneumonia due to other aerobic Gram-negative bacteria: Secondary | ICD-10-CM | POA: Diagnosis not present

## 2015-04-25 DIAGNOSIS — L89224 Pressure ulcer of left hip, stage 4: Secondary | ICD-10-CM | POA: Insufficient documentation

## 2015-04-25 DIAGNOSIS — G8221 Paraplegia, complete: Secondary | ICD-10-CM | POA: Insufficient documentation

## 2015-04-25 DIAGNOSIS — L89122 Pressure ulcer of left upper back, stage 2: Secondary | ICD-10-CM | POA: Insufficient documentation

## 2015-04-25 DIAGNOSIS — I9589 Other hypotension: Secondary | ICD-10-CM | POA: Diagnosis present

## 2015-04-25 DIAGNOSIS — E871 Hypo-osmolality and hyponatremia: Secondary | ICD-10-CM | POA: Diagnosis not present

## 2015-04-25 DIAGNOSIS — Z5181 Encounter for therapeutic drug level monitoring: Secondary | ICD-10-CM | POA: Insufficient documentation

## 2015-04-25 DIAGNOSIS — L0291 Cutaneous abscess, unspecified: Secondary | ICD-10-CM | POA: Diagnosis not present

## 2015-04-25 DIAGNOSIS — Z9911 Dependence on respirator [ventilator] status: Secondary | ICD-10-CM | POA: Diagnosis not present

## 2015-04-25 DIAGNOSIS — K219 Gastro-esophageal reflux disease without esophagitis: Secondary | ICD-10-CM | POA: Diagnosis present

## 2015-04-25 DIAGNOSIS — J9690 Respiratory failure, unspecified, unspecified whether with hypoxia or hypercapnia: Secondary | ICD-10-CM

## 2015-04-25 DIAGNOSIS — J9611 Chronic respiratory failure with hypoxia: Secondary | ICD-10-CM | POA: Diagnosis not present

## 2015-04-25 DIAGNOSIS — S14106S Unspecified injury at C6 level of cervical spinal cord, sequela: Secondary | ICD-10-CM

## 2015-04-25 DIAGNOSIS — B9562 Methicillin resistant Staphylococcus aureus infection as the cause of diseases classified elsewhere: Secondary | ICD-10-CM | POA: Diagnosis not present

## 2015-04-25 DIAGNOSIS — Z933 Colostomy status: Secondary | ICD-10-CM

## 2015-04-25 DIAGNOSIS — G8929 Other chronic pain: Secondary | ICD-10-CM | POA: Diagnosis present

## 2015-04-25 DIAGNOSIS — E2740 Unspecified adrenocortical insufficiency: Secondary | ICD-10-CM | POA: Diagnosis present

## 2015-04-25 DIAGNOSIS — Z8614 Personal history of Methicillin resistant Staphylococcus aureus infection: Secondary | ICD-10-CM

## 2015-04-25 DIAGNOSIS — Z959 Presence of cardiac and vascular implant and graft, unspecified: Secondary | ICD-10-CM | POA: Diagnosis not present

## 2015-04-25 DIAGNOSIS — Z87891 Personal history of nicotine dependence: Secondary | ICD-10-CM | POA: Diagnosis not present

## 2015-04-25 DIAGNOSIS — IMO0002 Reserved for concepts with insufficient information to code with codable children: Secondary | ICD-10-CM

## 2015-04-25 DIAGNOSIS — D638 Anemia in other chronic diseases classified elsewhere: Secondary | ICD-10-CM | POA: Diagnosis present

## 2015-04-25 DIAGNOSIS — L89154 Pressure ulcer of sacral region, stage 4: Secondary | ICD-10-CM | POA: Insufficient documentation

## 2015-04-25 DIAGNOSIS — K650 Generalized (acute) peritonitis: Secondary | ICD-10-CM | POA: Diagnosis not present

## 2015-04-25 DIAGNOSIS — N390 Urinary tract infection, site not specified: Secondary | ICD-10-CM | POA: Diagnosis present

## 2015-04-25 DIAGNOSIS — J45909 Unspecified asthma, uncomplicated: Secondary | ICD-10-CM | POA: Diagnosis present

## 2015-04-25 DIAGNOSIS — J9610 Chronic respiratory failure, unspecified whether with hypoxia or hypercapnia: Secondary | ICD-10-CM | POA: Diagnosis not present

## 2015-04-25 DIAGNOSIS — E10621 Type 1 diabetes mellitus with foot ulcer: Secondary | ICD-10-CM | POA: Diagnosis present

## 2015-04-25 DIAGNOSIS — E1040 Type 1 diabetes mellitus with diabetic neuropathy, unspecified: Secondary | ICD-10-CM

## 2015-04-25 DIAGNOSIS — J9621 Acute and chronic respiratory failure with hypoxia: Secondary | ICD-10-CM | POA: Diagnosis present

## 2015-04-25 DIAGNOSIS — J90 Pleural effusion, not elsewhere classified: Secondary | ICD-10-CM | POA: Diagnosis not present

## 2015-04-25 DIAGNOSIS — G9511 Acute infarction of spinal cord (embolic) (nonembolic): Secondary | ICD-10-CM

## 2015-04-25 DIAGNOSIS — J9601 Acute respiratory failure with hypoxia: Secondary | ICD-10-CM

## 2015-04-25 DIAGNOSIS — S91302A Unspecified open wound, left foot, initial encounter: Secondary | ICD-10-CM | POA: Diagnosis not present

## 2015-04-25 DIAGNOSIS — R1310 Dysphagia, unspecified: Secondary | ICD-10-CM | POA: Diagnosis not present

## 2015-04-25 DIAGNOSIS — Z930 Tracheostomy status: Secondary | ICD-10-CM | POA: Diagnosis not present

## 2015-04-25 DIAGNOSIS — L89214 Pressure ulcer of right hip, stage 4: Secondary | ICD-10-CM | POA: Insufficient documentation

## 2015-04-25 DIAGNOSIS — R609 Edema, unspecified: Secondary | ICD-10-CM

## 2015-04-25 DIAGNOSIS — R6521 Severe sepsis with septic shock: Secondary | ICD-10-CM | POA: Diagnosis present

## 2015-04-25 DIAGNOSIS — M8619 Other acute osteomyelitis, multiple sites: Secondary | ICD-10-CM | POA: Diagnosis not present

## 2015-04-25 DIAGNOSIS — L8994 Pressure ulcer of unspecified site, stage 4: Secondary | ICD-10-CM

## 2015-04-25 DIAGNOSIS — Z7901 Long term (current) use of anticoagulants: Secondary | ICD-10-CM

## 2015-04-25 DIAGNOSIS — M8660 Other chronic osteomyelitis, unspecified site: Secondary | ICD-10-CM | POA: Diagnosis present

## 2015-04-25 DIAGNOSIS — R578 Other shock: Secondary | ICD-10-CM | POA: Insufficient documentation

## 2015-04-25 DIAGNOSIS — E878 Other disorders of electrolyte and fluid balance, not elsewhere classified: Secondary | ICD-10-CM | POA: Diagnosis not present

## 2015-04-25 DIAGNOSIS — R7881 Bacteremia: Secondary | ICD-10-CM | POA: Insufficient documentation

## 2015-04-25 DIAGNOSIS — K3184 Gastroparesis: Secondary | ICD-10-CM | POA: Diagnosis present

## 2015-04-25 DIAGNOSIS — I959 Hypotension, unspecified: Secondary | ICD-10-CM

## 2015-04-25 DIAGNOSIS — T380X5A Adverse effect of glucocorticoids and synthetic analogues, initial encounter: Secondary | ICD-10-CM | POA: Diagnosis present

## 2015-04-25 DIAGNOSIS — E1041 Type 1 diabetes mellitus with diabetic mononeuropathy: Secondary | ICD-10-CM | POA: Diagnosis not present

## 2015-04-25 DIAGNOSIS — Z9119 Patient's noncompliance with other medical treatment and regimen: Secondary | ICD-10-CM | POA: Insufficient documentation

## 2015-04-25 DIAGNOSIS — E1043 Type 1 diabetes mellitus with diabetic autonomic (poly)neuropathy: Secondary | ICD-10-CM | POA: Diagnosis present

## 2015-04-25 DIAGNOSIS — R579 Shock, unspecified: Secondary | ICD-10-CM | POA: Diagnosis not present

## 2015-04-25 DIAGNOSIS — E860 Dehydration: Secondary | ICD-10-CM | POA: Diagnosis present

## 2015-04-25 DIAGNOSIS — M797 Fibromyalgia: Secondary | ICD-10-CM | POA: Diagnosis present

## 2015-04-25 DIAGNOSIS — Z993 Dependence on wheelchair: Secondary | ICD-10-CM | POA: Diagnosis not present

## 2015-04-25 DIAGNOSIS — T83030A Leakage of cystostomy catheter, initial encounter: Secondary | ICD-10-CM | POA: Diagnosis not present

## 2015-04-25 DIAGNOSIS — G9340 Encephalopathy, unspecified: Secondary | ICD-10-CM | POA: Diagnosis not present

## 2015-04-25 DIAGNOSIS — B9689 Other specified bacterial agents as the cause of diseases classified elsewhere: Secondary | ICD-10-CM | POA: Diagnosis not present

## 2015-04-25 DIAGNOSIS — Z7189 Other specified counseling: Secondary | ICD-10-CM | POA: Insufficient documentation

## 2015-04-25 DIAGNOSIS — T17990A Other foreign object in respiratory tract, part unspecified in causing asphyxiation, initial encounter: Secondary | ICD-10-CM | POA: Diagnosis not present

## 2015-04-25 DIAGNOSIS — E10622 Type 1 diabetes mellitus with other skin ulcer: Secondary | ICD-10-CM | POA: Insufficient documentation

## 2015-04-25 DIAGNOSIS — E876 Hypokalemia: Secondary | ICD-10-CM | POA: Diagnosis not present

## 2015-04-25 DIAGNOSIS — T8351XD Infection and inflammatory reaction due to indwelling urinary catheter, subsequent encounter: Secondary | ICD-10-CM | POA: Diagnosis not present

## 2015-04-25 DIAGNOSIS — Z794 Long term (current) use of insulin: Secondary | ICD-10-CM | POA: Diagnosis not present

## 2015-04-25 DIAGNOSIS — B9561 Methicillin susceptible Staphylococcus aureus infection as the cause of diseases classified elsewhere: Secondary | ICD-10-CM | POA: Diagnosis not present

## 2015-04-25 DIAGNOSIS — N319 Neuromuscular dysfunction of bladder, unspecified: Secondary | ICD-10-CM | POA: Diagnosis present

## 2015-04-25 DIAGNOSIS — J95851 Ventilator associated pneumonia: Secondary | ICD-10-CM | POA: Diagnosis not present

## 2015-04-25 DIAGNOSIS — Y95 Nosocomial condition: Secondary | ICD-10-CM | POA: Diagnosis present

## 2015-04-25 DIAGNOSIS — A498 Other bacterial infections of unspecified site: Secondary | ICD-10-CM | POA: Diagnosis not present

## 2015-04-25 DIAGNOSIS — Y848 Other medical procedures as the cause of abnormal reaction of the patient, or of later complication, without mention of misadventure at the time of the procedure: Secondary | ICD-10-CM | POA: Diagnosis not present

## 2015-04-25 DIAGNOSIS — G4700 Insomnia, unspecified: Secondary | ICD-10-CM | POA: Diagnosis present

## 2015-04-25 DIAGNOSIS — Z86711 Personal history of pulmonary embolism: Secondary | ICD-10-CM

## 2015-04-25 DIAGNOSIS — A4102 Sepsis due to Methicillin resistant Staphylococcus aureus: Secondary | ICD-10-CM | POA: Diagnosis present

## 2015-04-25 DIAGNOSIS — Z91199 Patient's noncompliance with other medical treatment and regimen due to unspecified reason: Secondary | ICD-10-CM

## 2015-04-25 DIAGNOSIS — B955 Unspecified streptococcus as the cause of diseases classified elsewhere: Secondary | ICD-10-CM | POA: Diagnosis not present

## 2015-04-25 DIAGNOSIS — E43 Unspecified severe protein-calorie malnutrition: Secondary | ICD-10-CM | POA: Diagnosis present

## 2015-04-25 DIAGNOSIS — G8254 Quadriplegia, C5-C7 incomplete: Secondary | ICD-10-CM | POA: Diagnosis present

## 2015-04-25 DIAGNOSIS — Y846 Urinary catheterization as the cause of abnormal reaction of the patient, or of later complication, without mention of misadventure at the time of the procedure: Secondary | ICD-10-CM | POA: Diagnosis not present

## 2015-04-25 DIAGNOSIS — L97429 Non-pressure chronic ulcer of left heel and midfoot with unspecified severity: Secondary | ICD-10-CM | POA: Diagnosis present

## 2015-04-25 DIAGNOSIS — R601 Generalized edema: Secondary | ICD-10-CM | POA: Diagnosis not present

## 2015-04-25 DIAGNOSIS — L89622 Pressure ulcer of left heel, stage 2: Secondary | ICD-10-CM | POA: Insufficient documentation

## 2015-04-25 DIAGNOSIS — E1140 Type 2 diabetes mellitus with diabetic neuropathy, unspecified: Secondary | ICD-10-CM | POA: Insufficient documentation

## 2015-04-25 DIAGNOSIS — R0600 Dyspnea, unspecified: Secondary | ICD-10-CM

## 2015-04-25 DIAGNOSIS — G9519 Other vascular myelopathies: Secondary | ICD-10-CM | POA: Insufficient documentation

## 2015-04-25 DIAGNOSIS — Z681 Body mass index (BMI) 19 or less, adult: Secondary | ICD-10-CM | POA: Diagnosis not present

## 2015-04-25 DIAGNOSIS — D62 Acute posthemorrhagic anemia: Secondary | ICD-10-CM | POA: Diagnosis not present

## 2015-04-25 DIAGNOSIS — E872 Acidosis: Secondary | ICD-10-CM | POA: Diagnosis not present

## 2015-04-25 DIAGNOSIS — L89132 Pressure ulcer of right lower back, stage 2: Secondary | ICD-10-CM | POA: Insufficient documentation

## 2015-04-25 DIAGNOSIS — J158 Pneumonia due to other specified bacteria: Secondary | ICD-10-CM | POA: Diagnosis not present

## 2015-04-25 DIAGNOSIS — A408 Other streptococcal sepsis: Secondary | ICD-10-CM | POA: Diagnosis not present

## 2015-04-25 DIAGNOSIS — I313 Pericardial effusion (noninflammatory): Secondary | ICD-10-CM | POA: Diagnosis not present

## 2015-04-25 DIAGNOSIS — R0602 Shortness of breath: Secondary | ICD-10-CM

## 2015-04-25 LAB — COMPREHENSIVE METABOLIC PANEL
ALT: 7 U/L — AB (ref 17–63)
AST: 12 U/L — ABNORMAL LOW (ref 15–41)
Albumin: 1.7 g/dL — ABNORMAL LOW (ref 3.5–5.0)
Alkaline Phosphatase: 160 U/L — ABNORMAL HIGH (ref 38–126)
Anion gap: 5 (ref 5–15)
BUN: 20 mg/dL (ref 6–20)
CHLORIDE: 105 mmol/L (ref 101–111)
CO2: 20 mmol/L — ABNORMAL LOW (ref 22–32)
Calcium: 10.2 mg/dL (ref 8.9–10.3)
Creatinine, Ser: 0.68 mg/dL (ref 0.61–1.24)
GFR calc Af Amer: >60 mL/min (ref >60)
GFR calc non Af Amer: >60 mL/min (ref >60)
Glucose, Bld: 116 mg/dL — ABNORMAL HIGH (ref 65–99)
Potassium: 4.7 mmol/L (ref 3.5–5.1)
Sodium: 130 mmol/L — ABNORMAL LOW (ref 135–145)
Total Bilirubin: 0.3 mg/dL (ref 0.3–1.2)
Total Protein: 7.3 g/dL (ref 6.5–8.1)

## 2015-04-25 LAB — CBC
HCT: 22.8 % — ABNORMAL LOW (ref 39.0–52.0)
HEMOGLOBIN: 7 g/dL — AB (ref 13.0–17.0)
MCH: 24.5 pg — AB (ref 26.0–34.0)
MCHC: 30.7 g/dL (ref 30.0–36.0)
MCV: 79.7 fL (ref 78.0–100.0)
Platelets: 732 10*3/uL — ABNORMAL HIGH (ref 150–400)
RBC: 2.86 MIL/uL — ABNORMAL LOW (ref 4.22–5.81)
RDW: 15.5 % (ref 11.5–15.5)
WBC: 30.1 10*3/uL — ABNORMAL HIGH (ref 4.0–10.5)

## 2015-04-25 LAB — URINALYSIS, ROUTINE W REFLEX MICROSCOPIC
BILIRUBIN URINE: NEGATIVE
Glucose, UA: NEGATIVE mg/dL
Ketones, ur: NEGATIVE mg/dL
NITRITE: POSITIVE — AB
Protein, ur: 100 mg/dL — AB
Specific Gravity, Urine: 1.02 (ref 1.005–1.030)
Urobilinogen, UA: 0.2 mg/dL (ref 0.0–1.0)
pH: 5.5 (ref 5.0–8.0)

## 2015-04-25 LAB — MAGNESIUM: Magnesium: 1.5 mg/dL — ABNORMAL LOW (ref 1.7–2.4)

## 2015-04-25 LAB — GLUCOSE, CAPILLARY: Glucose-Capillary: 188 mg/dL — ABNORMAL HIGH (ref 65–99)

## 2015-04-25 LAB — URINE MICROSCOPIC-ADD ON

## 2015-04-25 LAB — MRSA PCR SCREENING: MRSA by PCR: POSITIVE — AB

## 2015-04-25 LAB — I-STAT CG4 LACTIC ACID, ED: Lactic Acid, Venous: 1.23 mmol/L (ref 0.5–2.0)

## 2015-04-25 MED ORDER — SODIUM CHLORIDE 0.9 % IV BOLUS (SEPSIS)
1000.0000 mL | Freq: Once | INTRAVENOUS | Status: AC
  Administered 2015-04-25: 1000 mL via INTRAVENOUS

## 2015-04-25 MED ORDER — MIDODRINE HCL 5 MG PO TABS
10.0000 mg | ORAL_TABLET | Freq: Three times a day (TID) | ORAL | Status: DC
  Administered 2015-04-25 – 2015-04-28 (×10): 10 mg via ORAL
  Filled 2015-04-25 (×12): qty 2

## 2015-04-25 MED ORDER — LORATADINE 10 MG PO TABS
10.0000 mg | ORAL_TABLET | Freq: Every day | ORAL | Status: DC
  Administered 2015-04-25 – 2015-05-31 (×37): 10 mg via ORAL
  Filled 2015-04-25 (×41): qty 1

## 2015-04-25 MED ORDER — ACETAMINOPHEN 650 MG RE SUPP: 650.0000 mg | Freq: Four times a day (QID) | RECTAL | Status: DC | PRN

## 2015-04-25 MED ORDER — FENTANYL 25 MCG/HR TD PT72
25.0000 ug | MEDICATED_PATCH | TRANSDERMAL | Status: DC
  Administered 2015-04-28 – 2015-05-01 (×2): 25 ug via TRANSDERMAL
  Filled 2015-04-25 (×2): qty 1

## 2015-04-25 MED ORDER — PREGABALIN 75 MG PO CAPS
200.0000 mg | ORAL_CAPSULE | Freq: Every day | ORAL | Status: DC
  Administered 2015-04-26 – 2015-05-07 (×12): 200 mg via ORAL
  Filled 2015-04-25 (×4): qty 2
  Filled 2015-04-25: qty 1
  Filled 2015-04-25 (×4): qty 2
  Filled 2015-04-25: qty 1
  Filled 2015-04-25 (×2): qty 2
  Filled 2015-04-25: qty 1
  Filled 2015-04-25 (×3): qty 2

## 2015-04-25 MED ORDER — ALBUTEROL SULFATE (2.5 MG/3ML) 0.083% IN NEBU
2.5000 mg | INHALATION_SOLUTION | RESPIRATORY_TRACT | Status: DC | PRN
  Filled 2015-04-25: qty 3

## 2015-04-25 MED ORDER — SODIUM CHLORIDE 0.9 % IV BOLUS (SEPSIS)
500.0000 mL | Freq: Once | INTRAVENOUS | Status: DC
  Administered 2015-04-25: 500 mL via INTRAVENOUS

## 2015-04-25 MED ORDER — BACLOFEN 20 MG PO TABS
20.0000 mg | ORAL_TABLET | Freq: Every day | ORAL | Status: DC
  Administered 2015-04-25: 10 mg via ORAL
  Administered 2015-04-26 – 2015-05-31 (×36): 20 mg via ORAL
  Filled 2015-04-25: qty 1
  Filled 2015-04-25: qty 2
  Filled 2015-04-25 (×2): qty 1
  Filled 2015-04-25 (×3): qty 2
  Filled 2015-04-25 (×13): qty 1
  Filled 2015-04-25: qty 2
  Filled 2015-04-25: qty 1
  Filled 2015-04-25 (×2): qty 2
  Filled 2015-04-25: qty 1
  Filled 2015-04-25: qty 2
  Filled 2015-04-25: qty 1
  Filled 2015-04-25: qty 2
  Filled 2015-04-25 (×2): qty 1
  Filled 2015-04-25 (×2): qty 2
  Filled 2015-04-25 (×8): qty 1

## 2015-04-25 MED ORDER — ACETAMINOPHEN 325 MG PO TABS
650.0000 mg | ORAL_TABLET | Freq: Four times a day (QID) | ORAL | Status: DC | PRN
  Administered 2015-04-26 – 2015-05-21 (×9): 650 mg via ORAL
  Filled 2015-04-25 (×9): qty 2

## 2015-04-25 MED ORDER — DEXTROSE 5 % IV SOLN
2.0000 g | Freq: Once | INTRAVENOUS | Status: AC
  Administered 2015-04-25: 2 g via INTRAVENOUS
  Filled 2015-04-25: qty 2

## 2015-04-25 MED ORDER — BACLOFEN 10 MG PO TABS
10.0000 mg | ORAL_TABLET | Freq: Two times a day (BID) | ORAL | Status: DC
  Administered 2015-04-26 – 2015-06-01 (×70): 10 mg via ORAL
  Filled 2015-04-25 (×81): qty 1

## 2015-04-25 MED ORDER — VANCOMYCIN HCL IN DEXTROSE 750-5 MG/150ML-% IV SOLN
750.0000 mg | Freq: Two times a day (BID) | INTRAVENOUS | Status: DC
  Administered 2015-04-26 – 2015-04-27 (×4): 750 mg via INTRAVENOUS
  Filled 2015-04-25 (×6): qty 150

## 2015-04-25 MED ORDER — INSULIN ASPART 100 UNIT/ML ~~LOC~~ SOLN
0.0000 [IU] | Freq: Three times a day (TID) | SUBCUTANEOUS | Status: DC
  Administered 2015-04-26: 2 [IU] via SUBCUTANEOUS
  Administered 2015-04-26: 1 [IU] via SUBCUTANEOUS
  Administered 2015-04-26 – 2015-04-28 (×2): 2 [IU] via SUBCUTANEOUS
  Administered 2015-04-29: 7 [IU] via SUBCUTANEOUS

## 2015-04-25 MED ORDER — SODIUM CHLORIDE 0.9 % IV SOLN
INTRAVENOUS | Status: DC
  Administered 2015-04-25 – 2015-04-27 (×3): via INTRAVENOUS
  Administered 2015-04-27: 150 mL/h via INTRAVENOUS
  Administered 2015-04-28: 21:00:00 via INTRAVENOUS
  Administered 2015-04-28: 500 mL via INTRAVENOUS
  Administered 2015-04-28: 150 mL/h via INTRAVENOUS
  Administered 2015-04-29: 22:00:00 via INTRAVENOUS
  Administered 2015-04-29: 150 mL via INTRAVENOUS
  Administered 2015-04-30: 1000 mL via INTRAVENOUS
  Administered 2015-04-30 – 2015-05-01 (×3): via INTRAVENOUS

## 2015-04-25 MED ORDER — ONDANSETRON 8 MG PO TBDP
8.0000 mg | ORAL_TABLET | Freq: Three times a day (TID) | ORAL | Status: DC | PRN
  Administered 2015-04-25: 8 mg via ORAL
  Filled 2015-04-25 (×2): qty 1

## 2015-04-25 MED ORDER — APIXABAN 5 MG PO TABS
5.0000 mg | ORAL_TABLET | Freq: Two times a day (BID) | ORAL | Status: DC
  Administered 2015-04-25 – 2015-05-04 (×18): 5 mg via ORAL
  Filled 2015-04-25 (×24): qty 1

## 2015-04-25 MED ORDER — MIDODRINE HCL 5 MG PO TABS
10.0000 mg | ORAL_TABLET | Freq: Three times a day (TID) | ORAL | Status: DC
  Filled 2015-04-25: qty 2

## 2015-04-25 MED ORDER — OXYCODONE HCL 10 MG PO TABS: 10.0000 mg | ORAL_TABLET | Freq: Four times a day (QID) | ORAL | Status: DC | PRN

## 2015-04-25 MED ORDER — DEXTROSE 5 % IV SOLN
1.0000 g | Freq: Three times a day (TID) | INTRAVENOUS | Status: DC
  Administered 2015-04-26 – 2015-05-07 (×35): 1 g via INTRAVENOUS
  Filled 2015-04-25 (×41): qty 1

## 2015-04-25 MED ORDER — VANCOMYCIN HCL IN DEXTROSE 750-5 MG/150ML-% IV SOLN
750.0000 mg | INTRAVENOUS | Status: AC
  Administered 2015-04-25: 750 mg via INTRAVENOUS
  Filled 2015-04-25: qty 150

## 2015-04-25 MED ORDER — ONDANSETRON HCL 4 MG/2ML IJ SOLN: 4.0000 mg | Freq: Three times a day (TID) | INTRAMUSCULAR | Status: AC | PRN

## 2015-04-25 MED ORDER — FENTANYL 25 MCG/HR TD PT72: 25.0000 ug | MEDICATED_PATCH | TRANSDERMAL | Status: DC

## 2015-04-25 MED ORDER — MIRTAZAPINE 7.5 MG PO TABS
7.5000 mg | ORAL_TABLET | Freq: Every day | ORAL | Status: DC
  Administered 2015-04-27 – 2015-05-31 (×31): 7.5 mg via ORAL
  Filled 2015-04-25 (×38): qty 1

## 2015-04-25 MED ORDER — INSULIN GLARGINE 100 UNIT/ML ~~LOC~~ SOLN
20.0000 [IU] | Freq: Every day | SUBCUTANEOUS | Status: DC
  Administered 2015-04-25: 20 [IU] via SUBCUTANEOUS
  Filled 2015-04-25 (×2): qty 0.2

## 2015-04-25 MED ORDER — PREGABALIN 100 MG PO CAPS
100.0000 mg | ORAL_CAPSULE | Freq: Three times a day (TID) | ORAL | Status: DC
  Administered 2015-04-26 – 2015-06-01 (×102): 100 mg via ORAL
  Filled 2015-04-25: qty 2
  Filled 2015-04-25: qty 1
  Filled 2015-04-25 (×2): qty 2
  Filled 2015-04-25: qty 1
  Filled 2015-04-25: qty 2
  Filled 2015-04-25 (×9): qty 1
  Filled 2015-04-25: qty 2
  Filled 2015-04-25 (×4): qty 1
  Filled 2015-04-25: qty 2
  Filled 2015-04-25 (×3): qty 1
  Filled 2015-04-25: qty 2
  Filled 2015-04-25 (×3): qty 1
  Filled 2015-04-25: qty 2
  Filled 2015-04-25 (×5): qty 1
  Filled 2015-04-25: qty 2
  Filled 2015-04-25 (×4): qty 1
  Filled 2015-04-25: qty 4
  Filled 2015-04-25: qty 2
  Filled 2015-04-25 (×3): qty 1
  Filled 2015-04-25: qty 2
  Filled 2015-04-25 (×6): qty 1
  Filled 2015-04-25 (×2): qty 2
  Filled 2015-04-25: qty 1
  Filled 2015-04-25: qty 2
  Filled 2015-04-25 (×3): qty 1
  Filled 2015-04-25: qty 2
  Filled 2015-04-25 (×2): qty 1
  Filled 2015-04-25: qty 2
  Filled 2015-04-25 (×3): qty 1
  Filled 2015-04-25: qty 2
  Filled 2015-04-25 (×5): qty 1
  Filled 2015-04-25: qty 2
  Filled 2015-04-25: qty 1
  Filled 2015-04-25: qty 2
  Filled 2015-04-25: qty 1
  Filled 2015-04-25: qty 2
  Filled 2015-04-25 (×9): qty 1
  Filled 2015-04-25: qty 2
  Filled 2015-04-25: qty 4
  Filled 2015-04-25 (×3): qty 2
  Filled 2015-04-25 (×9): qty 1
  Filled 2015-04-25: qty 2
  Filled 2015-04-25 (×2): qty 1
  Filled 2015-04-25 (×3): qty 2
  Filled 2015-04-25 (×2): qty 1

## 2015-04-25 NOTE — Telephone Encounter (Signed)
noted 

## 2015-04-25 NOTE — H&P (Signed)
PCP:   [name], MD   Chief Complaint:  Hypotension  HPI: 29 year old male who   has a past medical history of GERD (gastroesophageal reflux disease); Asthma; MRSA infection; Gastroparesis; Diabetic neuropathy; Seizures; Family history of anesthesia complication; Dysrhythmia; Pneumonia; Arthritis; Fibromyalgia; Stroke (01/29/2014); Type I diabetes mellitus; and Syncope (02/16/2015). *Patient has a history of C6 spinal cord injury has been wheelchair-bound. Patient was sent to the emergency room from the physical therapy office today after patient was found to be hypotensive. [name] says that he has not been eating and drinking well for past two  weeks. In the ED patient was found to be extremely dehydrated, had white count of 20,000, chest x-ray showing pneumonia as well as abnormal UA consistent with UTI. Patient denies any symptoms, no chest pain or shortness of breath no cough and no nausea vomiting or diarrhea. Patient has ostomy bag in place Patient started on vancomycin and is active in the ED  Allergies:   Allergies  Allergen Reactions  . Cefuroxime Axetil Anaphylaxis  . Ertapenem Other (See Comments)    Rash and confusion  . Morphine And Related Other (See Comments)    Changed mental status, confusion, headache, visual hallucination  . Penicillins Anaphylaxis and Other (See Comments)    ?can take amoxicillin?  . Sulfa Antibiotics Anaphylaxis, Shortness Of Breath and Other (See Comments)  . Tessalon [Benzonatate] Anaphylaxis  . Shellfish Allergy Itching and Other (See Comments)    Took benadryl to alleviate reaction  . Miripirium Rash and Other (See Comments)    Change in mental status      Past Medical History  Diagnosis Date  . GERD (gastroesophageal reflux disease)   . Asthma   . Hx MRSA infection     on face  . Gastroparesis   . Diabetic neuropathy   . Seizures   . Family history of anesthesia complication     Pt mother can't have epidural  procedures  . Dysrhythmia   . Pneumonia   . Arthritis   . Fibromyalgia   . 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 Type I diabetes mellitus     sees Dr. Loanne Drilling   . Syncope 02/16/2015    Past Surgical History  Procedure Laterality Date  . Tonsillectomy    . Multiple extractions with alveoloplasty N/A 08/03/2014    Procedure: MULTIPLE EXTRACTIONS;  Surgeon: Gae Bon, DDS;  Location: [place];  Service: Oral Surgery;  Laterality: N/A;  . Tee without cardioversion N/A 08/17/2014    Procedure: TRANSESOPHAGEAL ECHOCARDIOGRAM (TEE);  Surgeon: Dorothy Spark, MD;  Location: [facility] ENDOSCOPY;  Service: Cardiovascular;  Laterality: N/A;  . Debridment of decubitus ulcer N/A 10/04/2014    Procedure: DEBRIDMENT OF DECUBITUS ULCER;  Surgeon: Georganna Skeans, MD;  Location: [place];  Service: General;  Laterality: N/A;  . Laparoscopic diverted colostomy N/A 10/12/2014    Procedure: LAPAROSCOPIC DIVERTING COLOSTOMY;  Surgeon: Donnie Mesa, MD;  Location: [place];  Service: General;  Laterality: N/A;  . Insertion of suprapubic catheter N/A 10/12/2014    Procedure: INSERTION OF SUPRAPUBIC CATHETER;  Surgeon: Reece Packer, MD;  Location: [place];  Service: Urology;  Laterality: N/A;  . Incision and drainage of wound N/A 11/12/2014    Procedure: IRRIGATION AND DEBRIDEMENT OF WOUNDS WITH BONE BIOPSY AND SURGICAL PREP ;  Surgeon: Theodoro Kos, DO;  Location: [place];  Service: Plastics;  Laterality: N/A;  . Application of a-cell of back N/A 11/12/2014  Procedure: APPLICATION A CELL AND VAC ;  Surgeon: Theodoro Kos, DO;  Location: [place];  Service: Plastics;  Laterality: N/A;  . Incision and drainage of wound N/A 11/18/2014    Procedure: IRRIGATION AND DEBRIDEMENT OF SACRAL ULCER ONLY WITH PLACEMENT OF A CELL AND VAC/ DRESSING CHANGE TO UPPER BACK AREA.;  Surgeon: Theodoro Kos, DO;  Location: [place];  Service: Plastics;  Laterality: N/A;  . Incision and drainage of wound N/A 11/25/2014     Procedure: IRRIGATION AND DEBRIDEMENT OF SACRAL ULCER AND BACK BURN WITH PLACEMENT OF A-CELL;  Surgeon: Theodoro Kos, DO;  Location: [place];  Service: Plastics;  Laterality: N/A;  . Minor application of wound vac N/A 11/25/2014    Procedure:  WOUND VAC CHANGE;  Surgeon: Theodoro Kos, DO;  Location: [place];  Service: Plastics;  Laterality: N/A;  . Incision and drainage of wound Right 12/08/2014    Procedure: IRRIGATION AND DEBRIDEMENT SACRAL WOUND AND RIGHT ISCHIAL WOUND ;  Surgeon: Theodoro Kos, DO;  Location: [place];  Service: Plastics;  Laterality: Right;  . Application of a-cell of back N/A 12/08/2014    Procedure: APPLICATION OF A-CELL AND WOUND VAC ;  Surgeon: Theodoro Kos, DO;  Location: [place];  Service: Plastics;  Laterality: N/A;    Prior to Admission medications   Medication Sig Start Date End Date Taking? Authorizing Provider  acetaminophen (TYLENOL) 325 MG tablet Take 2 tablets (650 mg total) by mouth every 6 (six) hours as needed for mild pain, moderate pain, fever or headache. 01/14/15  Yes Geradine Girt, DO  albuterol (PROVENTIL) (2.5 MG/3ML) 0.083% nebulizer solution Take 3 mLs (2.5 mg total) by nebulization every 4 (four) hours as needed for wheezing or shortness of breath. 04/27/14  Yes [name], MD  apixaban (ELIQUIS) 5 MG TABS tablet Take 1 tablet (5 mg total) by mouth 2 (two) times daily. 04/14/15  Yes [name], MD  B Complex-Folic Acid (B COMPLEX-VITAMIN B12 PO) Take 1 tablet by mouth at bedtime.   Yes Historical Provider, MD  baclofen (LIORESAL) 10 MG tablet Take 1-2 tablets (10-20 mg total) by mouth 3 (three) times daily. Takes 10mg  in morning and lunchtime and takes 20mg  at bedtime 11/29/14  Yes Meredith Staggers, MD  collagenase (SANTYL) ointment Apply 1 application topically daily. 08/30/14  Yes [name], MD  diclofenac sodium (VOLTAREN) 1 % GEL Apply 2 g topically daily as needed (for pain). 11/19/14  Yes Shanker Kristeen Mans, MD  feeding supplement, GLUCERNA SHAKE,  (GLUCERNA SHAKE) LIQD Take 237 mLs by mouth 3 (three) times daily between meals. 12/10/14  Yes Florencia Reasons, MD  fentaNYL (DURAGESIC - DOSED MCG/HR) 25 MCG/HR patch Place 1 patch (25 mcg total) onto the skin every 3 (three) days. 04/25/15  Yes Bayard Hugger, NP  glucagon (GLUCAGON EMERGENCY) 1 MG injection Inject 1 mg into the vein once as needed. 04/20/14  Yes [name], MD  insulin aspart (NOVOLOG) 100 UNIT/ML injection Inject 5 Units into the skin 3 (three) times daily with meals. 01/14/15  Yes Jessica U Vann, DO  insulin glargine (LANTUS) 100 UNIT/ML injection Inject 0.3 mLs (30 Units total) into the skin at bedtime. Patient taking differently: Inject 20 Units into the skin at bedtime.  03/05/15  Yes Velvet Bathe, MD  loratadine (CLARITIN) 10 MG tablet Take 10 mg by mouth at bedtime.    Yes Historical Provider, MD  metoCLOPramide (REGLAN) 5 MG tablet Give 3 doses a week Patient taking  differently: Take 5 mg by mouth 2 (two) times a week.  04/08/15  Yes [name], MD  midodrine (PROAMATINE) 10 MG tablet Take 1 tablet (10 mg total) by mouth 3 (three) times daily with meals. 04/04/15  Yes [name], MD  mirtazapine (REMERON) 7.5 MG tablet Take 1 tablet (7.5 mg total) by mouth at bedtime. 04/04/15  Yes [name], MD  Multiple Vitamin (MULTIVITAMIN WITH MINERALS) TABS tablet Take 1 tablet by mouth daily.   Yes Historical Provider, MD  mupirocin ointment (BACTROBAN) 2 % 1 application QD 2/87/86  Yes Historical Provider, MD  omeprazole (PRILOSEC) 40 MG capsule Take 40 mg by mouth at bedtime.    Yes Historical Provider, MD  ondansetron (ZOFRAN ODT) 8 MG disintegrating tablet Take 1 tablet (8 mg total) by mouth every 8 (eight) hours as needed for nausea or vomiting. 12/29/14  Yes [name], MD  Oxycodone HCl 10 MG TABS Take 1 tablet (10 mg total) by mouth every 6 (six) hours as needed. Patient taking differently: Take 10-20 mg by mouth every 6 (six) hours as needed (pain). Pt can have 2 tablets when  he goes to the wound center for dressing change 04/25/15  Yes Bayard Hugger, NP  pregabalin (LYRICA) 100 MG capsule Take 1 capsule (100 mg total) by mouth 3 (three) times daily. Take 100 mg by mouth in the morning, take 100 mg by mouth in the afternoon, take 100 mg by mouth in the evening and take 200 mg by mouth at bedtime. 03/05/15  Yes Velvet Bathe, MD  metroNIDAZOLE (FLAGYL) 500 MG tablet Take 1 tablet (500 mg total) by mouth every 8 (eight) hours. Patient not taking: Reported on 04/25/2015 03/05/15   Velvet Bathe, MD  nitrofurantoin, macrocrystal-monohydrate, (MACROBID) 100 MG capsule Take 1 capsule (100 mg total) by mouth 2 (two) times daily. Patient not taking: Reported on 04/25/2015 04/01/15   Montine Circle, PA-C  Vancomycin (VANCOCIN) 750 MG/150ML SOLN Inject 150 mLs (750 mg total) into the vein every 12 (twelve) hours. Patient not taking: Reported on 04/25/2015 03/05/15   Velvet Bathe, MD    Social History:  reports that he quit smoking about 7 months ago. His smoking use included Cigars and Cigarettes. He has a 12 pack-year smoking history. He has never used smokeless tobacco. He reports that he does not drink alcohol or use illicit drugs.  Family History  Problem Relation Age of Onset  . Diabetes Father   . Hypertension Father   . Asthma      fhx  . Hypertension      fhx  . Stroke      fhx  . Heart disease Mother     Danley Danker Weights   04/25/15 1711  Weight: 49.896 kg (110 lb)      Review of Systems:  Hasn't history of present illness rest of review of systems negative   Physical Exam: Blood pressure 95/63, pulse 71, temperature 97.4 F (36.3 C), temperature source Oral, resp. rate 16, height 5\' 8"  (1.727 m), weight 49.896 kg (110 lb), SpO2 100 %. Constitutional:   Patient is a malnourished-appearing male* in no acute distress and cooperative with exam. Head: Normocephalic and atraumatic Mouth: Mucus membranes moist Eyes: PERRL, EOMI, conjunctivae normal Neck: Supple,  No Thyromegaly Cardiovascular: RRR, S1 normal, S2 normal Pulmonary/Chest: CTAB, no wheezes, rales, or rhonchi Abdominal: Soft. Non-tender, non-distended, bowel sounds are normal, no masses, organomegaly, or guarding present. Ostomy bag in place but soft yellow stool, suprapubic  catheter in place,  no purulent drainage or redness  Neurological: A&O x3, paraplegia, moving upper extremities Extremities : No Cyanosis, Clubbing or Edema  Labs on Admission:  Basic Metabolic Panel:  Recent Labs Lab 04/25/15 1612  NA 130*  K 4.7  CL 105  CO2 20*  GLUCOSE 116*  BUN 20  CREATININE 0.68  CALCIUM 10.2  MG 1.5*   Liver Function Tests:  Recent Labs Lab 04/25/15 1612  AST 12*  ALT 7*  ALKPHOS 160*  BILITOT 0.3  PROT 7.3  ALBUMIN 1.7*   CBC:  Recent Labs Lab 04/25/15 1612  WBC 30.1*  HGB 7.0*  HCT 22.8*  MCV 79.7  PLT 732*    Radiological Exams on Admission: Dg Chest Port 1 View  04/25/2015   CLINICAL DATA:  Hypotension  EXAM: PORTABLE CHEST - 1 VIEW  COMPARISON:  March 30, 2015  FINDINGS: There is bibasilar interstitial infiltrate. Lungs elsewhere clear. Heart size and pulmonary vascularity are normal. No adenopathy. 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  IMPRESSION: Bibasilar interstitial infiltrate. Suspect atypical pneumonia as most likely etiology. Differential considerations must include allergic type reaction or atypical noncardiogenic edema. Lungs elsewhere clear. No change in cardiac silhouette.   Electronically Signed   By: Lowella Grip III M.D.   On: 04/25/2015 16:03       Assessment/Plan Active Problems:   Sepsis   HCAP (healthcare-associated pneumonia)  Healthcare associated pneumonia Patient chest x-ray shows bibasilar interstitial infiltrate, atypical pneumonia. Started on vancomycin and Azactam. White count is 3000. Follow blood cultures 2. Lactic acid 1.23. Continue IV normal saline at 125 mL per hour. Follow CBC and BMP in a.m.  UTI Patient has  abnormal UA, urine culture obtained. Continue antibiotics as above follow urine culture results.  Diabetes mellitus Sliding scale insulin with NovoLog  Sacral decubitus ulcer Patient has wound VAC in place, will obtain wound care consultation  Code status: Full code  Family discussion: Admission, patients condition and plan of care including tests being ordered have been discussed with the patient and his mother bedside* who indicate understanding and agree with the plan and Code Status.   Time Spent on Admission: 60 min  [facility] Pager: [phone] 04/25/2015, 6:08 PM  If 7PM-7AM, please contact night-coverage  [email or web address]  Password [email or web address]

## 2015-04-25 NOTE — Progress Notes (Addendum)
ANTIBIOTIC CONSULT NOTE - INITIAL  Pharmacy Consult for Vancomycin Indication: pneumonia  Allergies  Allergen Reactions  . Cefuroxime Axetil Anaphylaxis  . Ertapenem Other (See Comments)    Rash and confusion  . Morphine And Related Other (See Comments)    Changed mental status, confusion, headache, visual hallucination  . Penicillins Anaphylaxis and Other (See Comments)    ?can take amoxicillin?  . Sulfa Antibiotics Anaphylaxis, Shortness Of Breath and Other (See Comments)  . Tessalon [Benzonatate] Anaphylaxis  . Shellfish Allergy Itching and Other (See Comments)    Took benadryl to alleviate reaction  . Miripirium Rash and Other (See Comments)    Change in mental status    Patient Measurements: Weight: 110 lb (49.896 kg)  Vital Signs: Temp: 97.4 F (36.3 C) (07/18 1545) Temp Source: Oral (07/18 1545) BP: 95/61 mmHg (07/18 1630) Pulse Rate: 72 (07/18 1630) Intake/Output from previous day:   Intake/Output from this shift:    Labs:  Recent Labs  04/25/15 1612  WBC 30.1*  HGB 7.0*  PLT 732*  CREATININE 0.68   Estimated Creatinine Clearance: 95.3 mL/min (by C-G formula based on Cr of 0.68). No results for input(s): VANCOTROUGH, VANCOPEAK, VANCORANDOM, GENTTROUGH, GENTPEAK, GENTRANDOM, TOBRATROUGH, TOBRAPEAK, TOBRARND, AMIKACINPEAK, AMIKACINTROU, AMIKACIN in the last 72 hours.   Microbiology: Recent Results (from the past 720 hour(s))  Urine culture     Status: None   Collection Time: 03/30/15  8:05 PM  Result Value Ref Range Status   Specimen Description URINE, CLEAN CATCH  Final   Special Requests NONE  Final   Culture   Final    >=100,000 COLONIES/mL ESCHERICHIA COLI Performed at [facility]    Report Status 04/01/2015 FINAL  Final   Organism ID, Bacteria ESCHERICHIA COLI  Final      Susceptibility   Escherichia coli - MIC*    AMPICILLIN <=2 SENSITIVE Sensitive     CEFAZOLIN <=4 SENSITIVE Sensitive     CEFTRIAXONE <=1 SENSITIVE Sensitive      CIPROFLOXACIN >=4 RESISTANT Resistant     GENTAMICIN <=1 SENSITIVE Sensitive     IMIPENEM <=0.25 SENSITIVE Sensitive     NITROFURANTOIN <=16 SENSITIVE Sensitive     TRIMETH/SULFA <=20 SENSITIVE Sensitive     AMPICILLIN/SULBACTAM <=2 SENSITIVE Sensitive     PIP/TAZO <=4 SENSITIVE Sensitive     * >=100,000 COLONIES/mL ESCHERICHIA COLI  Culture, blood (routine x 2)     Status: None   Collection Time: 03/30/15  8:23 PM  Result Value Ref Range Status   Specimen Description BLOOD LAC  Final   Special Requests BOTTLES DRAWN AEROBIC AND ANAEROBIC 5CC  Final   Culture  Setup Time   Final    GRAM POSITIVE COCCI IN CLUSTERS AEROBIC BOTTLE ONLY CALLED TO ASHUE(MLT) BY [facility] 04/01/15 AT 7:41AM CRITICAL RESULT CALLED TO, READ BACK AND VERIFIED WITH J. HAMILTON RN AT 0745 ON 06.24.16 BY SHUEA CONFIRMED BY KBARR,MT    Culture   Final    METHICILLIN RESISTANT STAPHYLOCOCCUS AUREUS Performed at [facility]    Report Status 04/05/2015 FINAL  Final   Organism ID, Bacteria METHICILLIN RESISTANT STAPHYLOCOCCUS AUREUS  Final      Susceptibility   Methicillin resistant staphylococcus aureus - MIC*    CIPROFLOXACIN >=8 RESISTANT Resistant     ERYTHROMYCIN >=8 RESISTANT Resistant     GENTAMICIN <=0.5 SENSITIVE Sensitive     OXACILLIN >=4 RESISTANT Resistant     TETRACYCLINE <=1 SENSITIVE Sensitive     VANCOMYCIN 1 SENSITIVE Sensitive  TRIMETH/SULFA >=320 RESISTANT Resistant     CLINDAMYCIN >=8 RESISTANT Resistant     RIFAMPIN <=0.5 SENSITIVE Sensitive     Inducible Clindamycin NEGATIVE Sensitive     * METHICILLIN RESISTANT STAPHYLOCOCCUS AUREUS  Culture, blood (routine x 2)     Status: None   Collection Time: 03/30/15  8:31 PM  Result Value Ref Range Status   Specimen Description BLOOD [name]  Final   Special Requests BOTTLES DRAWN AEROBIC AND ANAEROBIC 5CC  Final   Culture   Final    NO GROWTH 5 DAYS Performed at [facility]    Report Status 04/04/2015 FINAL  Final     Medical History: Past Medical History  Diagnosis Date  . GERD (gastroesophageal reflux disease)   . Asthma   . Hx MRSA infection     on face  . Gastroparesis   . Diabetic neuropathy   . Seizures   . Family history of anesthesia complication     Pt mother can't have epidural procedures  . Dysrhythmia   . Pneumonia   . Arthritis   . Fibromyalgia   . 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 Type I diabetes mellitus     sees Dr. Loanne Drilling   . Syncope 02/16/2015    Medications:  Scheduled:  . vancomycin  750 mg Intravenous STAT  . [START ON 04/26/2015] vancomycin  750 mg Intravenous Q12H   Infusions:  . aztreonam     PRN:   Assessment: 29 yo male with history of a C6 spinal cord injury, wheelchair bound, with multiple medical problems with frequent visits to the ED. He presents from the wound care clinic with hypotension (BP 60/47). CXR shows bibasilar interstitial infiltrate - suspect atypical pneumonia.  EDP has ordered aztreonam x 1 dose and Pharmacy is consulted to dose vancomycin.  7/18 >> Vanc x 8d >> 7/18 >> Aztreonam x 1  7/18 blood x 2: sent 7/18 urine: sent  Tmax: afebrile WBC: 30k Renal: SCr 0.68, CrCl 95 ml/min (likely overestimates true renal function due to severe muscle wasting)  Goal of Therapy:  Vancomycin trough level 15-20 mcg/ml  Plan:   Vancomycin 750mg  IV q12h Check trough at steady state Follow up renal function & cultures, clinical course  Peggyann Juba, PharmD, BCPS Pager: [phone] 04/25/2015,5:16 PM   Addendum: On admission, Pharmacy consulted to dose aztreonam.  Patient was given 2g IV in ED, will continue with 1g IV q8h and monitor renal function.  Peggyann Juba, PharmD, BCPS 04/25/2015 7:20 PM

## 2015-04-25 NOTE — ED Notes (Signed)
Report given to [facility], RN

## 2015-04-25 NOTE — Progress Notes (Signed)
Subjective:    Patient ID: Jason [name], male    DOB: [birth year], 29 y.o.   MRN: [id number]  HPI:[name] is a 29 year old male who returns for chronic pain and medication refill. He says his pain is located in his right shoulder and mid-lower back. He rates his pain 8. [name] arrived to office hypotensive 65/45 his mother states they left the wound center and he had his analgesic 20-30 minutes ago. RN placed his motorized chair in a modified trendelenburg blood pressure 75/53. Upon my assessment blood pressure 69/48, 67/43, modified trendelenburg 82/60. Upon sitting him up blood pressure dropped to 68/46 and re evaluated 71/54.  Spoke to [name] about going to the  ED, he was hesitant his mother reiterated my concerns. Also mother states he hasn't been eating and the wound center encouraged peg-tube. She will be calling her PCP regarding this matter. His mother will be transporting him to [facility] ED, they refused EMS.  Pain Inventory Average Pain 8 Pain Right Now 8 My pain is constant and aching  In the last 24 hours, has pain interfered with the following? General activity 7 Relation with others 8 Enjoyment of life 8 What TIME of day is your pain at its worst? morning evening  Sleep (in general) Fair  Pain is worse with: some activites Pain improves with: medication Relief from Meds: 6  Mobility use a wheelchair needs help with transfers  Function disabled: date disabled . I need assistance with the following:  feeding, dressing, bathing, toileting, meal prep, household duties and shopping  Neuro/Psych weakness tremor  Prior Studies Any changes since last visit?  no  Physicians involved in your care Any changes since last visit?  no   Family History  Problem Relation Age of Onset  . Diabetes Father   . Hypertension Father   . Asthma      fhx  . Hypertension      fhx  . Stroke      fhx  . Heart disease Mother    History   Social  History  . Marital Status: Single    Spouse Name: N/A  . Number of Children: 0  . Years of Education: 11   Occupational History  . Disability    Social History Main Topics  . Smoking status: Former Smoker -- 1.00 packs/day for 12 years    Types: Cigars, Cigarettes    Quit date: 09/07/2014  . Smokeless tobacco: Never Used  . Alcohol Use: No  . Drug Use: No  . Sexual Activity: Not Currently   Other Topics Concern  . None   Social History Narrative   Patient lives at home with mother father.    Patient has 2 children.    Patient is single.    Patient was left hand but after stroke patient is now right handed.    Patient has  11th grade education.    Past Surgical History  Procedure Laterality Date  . Tonsillectomy    . Multiple extractions with alveoloplasty N/A 08/03/2014    Procedure: MULTIPLE EXTRACTIONS;  Surgeon: Gae Bon, DDS;  Location: [place];  Service: Oral Surgery;  Laterality: N/A;  . Tee without cardioversion N/A 08/17/2014    Procedure: TRANSESOPHAGEAL ECHOCARDIOGRAM (TEE);  Surgeon: Dorothy Spark, MD;  Location: [facility] ENDOSCOPY;  Service: Cardiovascular;  Laterality: N/A;  . Debridment of decubitus ulcer N/A 10/04/2014    Procedure: DEBRIDMENT OF DECUBITUS ULCER;  Surgeon: Georganna Skeans, MD;  Location: [facility] OR;  Service: General;  Laterality: N/A;  . Laparoscopic diverted colostomy N/A 10/12/2014    Procedure: LAPAROSCOPIC DIVERTING COLOSTOMY;  Surgeon: Donnie Mesa, MD;  Location: [place];  Service: General;  Laterality: N/A;  . Insertion of suprapubic catheter N/A 10/12/2014    Procedure: INSERTION OF SUPRAPUBIC CATHETER;  Surgeon: Reece Packer, MD;  Location: [place];  Service: Urology;  Laterality: N/A;  . Incision and drainage of wound N/A 11/12/2014    Procedure: IRRIGATION AND DEBRIDEMENT OF WOUNDS WITH BONE BIOPSY AND SURGICAL PREP ;  Surgeon: Theodoro Kos, DO;  Location: [place];  Service: Plastics;  Laterality: N/A;  . Application of a-cell of back N/A  11/12/2014    Procedure: APPLICATION A CELL AND VAC ;  Surgeon: Theodoro Kos, DO;  Location: [place];  Service: Plastics;  Laterality: N/A;  . Incision and drainage of wound N/A 11/18/2014    Procedure: IRRIGATION AND DEBRIDEMENT OF SACRAL ULCER ONLY WITH PLACEMENT OF A CELL AND VAC/ DRESSING CHANGE TO UPPER BACK AREA.;  Surgeon: Theodoro Kos, DO;  Location: [place];  Service: Plastics;  Laterality: N/A;  . Incision and drainage of wound N/A 11/25/2014    Procedure: IRRIGATION AND DEBRIDEMENT OF SACRAL ULCER AND BACK BURN WITH PLACEMENT OF A-CELL;  Surgeon: Theodoro Kos, DO;  Location: [place];  Service: Plastics;  Laterality: N/A;  . Minor application of wound vac N/A 11/25/2014    Procedure:  WOUND VAC CHANGE;  Surgeon: Theodoro Kos, DO;  Location: [place];  Service: Plastics;  Laterality: N/A;  . Incision and drainage of wound Right 12/08/2014    Procedure: IRRIGATION AND DEBRIDEMENT SACRAL WOUND AND RIGHT ISCHIAL WOUND ;  Surgeon: Theodoro Kos, DO;  Location: [place];  Service: Plastics;  Laterality: Right;  . Application of a-cell of back N/A 12/08/2014    Procedure: APPLICATION OF A-CELL AND WOUND VAC ;  Surgeon: Theodoro Kos, DO;  Location: [place];  Service: Plastics;  Laterality: N/A;   Past Medical History  Diagnosis Date  . GERD (gastroesophageal reflux disease)   . Asthma   . Hx MRSA infection     on face  . Gastroparesis   . Diabetic neuropathy   . Seizures   . Family history of anesthesia complication     Pt mother can't have epidural procedures  . Dysrhythmia   . Pneumonia   . Arthritis   . Fibromyalgia   . 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 Type I diabetes mellitus     sees Dr. Loanne Drilling   . Syncope 02/16/2015   BP 75/53 mmHg  Pulse 74  Resp 14  SpO2 96%  Opioid Risk Score:   Fall Risk Score: Low Fall Risk (0-5 points)`1  Depression screen PHQ 2/9  Depression screen [facility] 2/9 04/25/2015 02/16/2015  Decreased Interest 1 0  Down, Depressed, Hopeless 1 0    PHQ - 2 Score 2 0  Altered sleeping 1 -  Tired, decreased energy 2 -  Change in appetite 2 -  Feeling bad or failure about yourself  1 -  Trouble concentrating 0 -  Moving slowly or fidgety/restless 1 -  Suicidal thoughts 0 -  PHQ-9 Score 9 -     Review of Systems  Constitutional: Positive for appetite change and unexpected weight change.  Neurological: Positive for tremors and weakness.  All other systems reviewed and are negative.      Objective:   Physical Exam  Constitutional: He is  oriented to person, place, and time.  Weak, Frail emaciated  HENT:  Head: Normocephalic and atraumatic.  Neck: Normal range of motion. Neck supple.  Cardiovascular: Normal rate and regular rhythm.   Pulmonary/Chest: Effort normal and breath sounds normal.  Genitourinary:  Ostomy: Brown Stool  Supra Pubic: Clear Yellow Urine  Musculoskeletal:  Waisting Muscle: Muscle Testing Reveals: Upper Extremities: Decreased ROM 45  Degrees: Muscle Strength 0/5 Lower Extremities: Paralysis Left Foot Dressing Intact Arrived in Motorized Wheelchair    Neurological: He is alert and oriented to person, place, and time.  Skin: Skin is warm and dry.  Psychiatric: He has a normal mood and affect.  Nursing note and vitals reviewed.         Assessment & Plan:  1. 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 Chronic Pain Management: RX: Oxycodone 10 mg one tablet every 6 hours as needed. He can have 2 tablets when he goes to the wound center for dressing change # 150 and Fentanyl 25 mcg one patch every three days. #10 Continue Lyrica, Voltaren gel. 3. Spinal cord infarct: Continue current medication regime 4. DM type 2 with gastroparesis: PCP Following 5. ChronicBLE wounds/ Sacral Decubitus: Infectious Disease Following/ S/ P Diverting Colostomy and Supra Pubic Catheter  6. Severe diabetic peripheral neuropathy: Continue  Lyrica 7. Neurogenic bladder/urethral ulceration: S/P Supra Pubic Catheter Urology Following  53minutes of face to face patient care time was spent during this visit. All questions were encouraged and answered.

## 2015-04-25 NOTE — ED Notes (Addendum)
Pt and mother report pt was at wound center facility and had low BP. Pt still hypotensive. Reports his chronic back pain. Pt answers questions slowly. Pt has multiple health issues, foley catheter, wound vac, and colostomy.

## 2015-04-25 NOTE — Telephone Encounter (Signed)
Pt Mom call to say that pt was at [facility] appt and they took his bp and it was 60/47 and they rush him to the hospital. Mom also said pt with in the month and a half pt has lost 25 pounds

## 2015-04-25 NOTE — ED Provider Notes (Signed)
CSN: [id number]     Arrival date & time 04/25/15  1522 History   First MD Initiated Contact with Patient 04/25/15 1532     Chief Complaint  Patient presents with  . Hypotension     (Consider location/radiation/quality/duration/timing/severity/associated sxs/prior Treatment) HPI Comments: The patient is an ill with appearing 29 year old male, he has a history of a C6 spinal cord injury and has been in a wheelchair since that time, this has been for years. He has frequent visits to the emergency department for a variety of medical problems. He is chronically ill, has chronic muscle wasting, has been seen at his pain clinic/physical therapy office today who recommended that he come to the emergency department for severe dehydration. The patient has been taking very little by mouth, he has been eating very little, his urine has become extremely concentrated and he is feeling extremely weak and somnolent. His mother is the primary historian as the patient is exceeding weak and has difficulty speaking. There has been no fevers, no cloudy urine, no coughing, no new rashes. His ostomy is putting out light-colored stool. No blood. Symptoms are severe and persistent.  Sent to ED by Dr. [name]  The history is provided by the patient, a parent and medical records.    Past Medical History  Diagnosis Date  . GERD (gastroesophageal reflux disease)   . Asthma   . Hx MRSA infection     on face  . Gastroparesis   . Diabetic neuropathy   . Seizures   . Family history of anesthesia complication     Pt mother can't have epidural procedures  . Dysrhythmia   . Pneumonia   . Arthritis   . Fibromyalgia   . 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 Type I diabetes mellitus     sees Dr. Loanne Drilling   . Syncope 02/16/2015   Past Surgical History  Procedure Laterality Date  . Tonsillectomy    . Multiple extractions with alveoloplasty N/A 08/03/2014    Procedure: MULTIPLE EXTRACTIONS;   Surgeon: Gae Bon, DDS;  Location: [place];  Service: Oral Surgery;  Laterality: N/A;  . Tee without cardioversion N/A 08/17/2014    Procedure: TRANSESOPHAGEAL ECHOCARDIOGRAM (TEE);  Surgeon: Dorothy Spark, MD;  Location: [facility] ENDOSCOPY;  Service: Cardiovascular;  Laterality: N/A;  . Debridment of decubitus ulcer N/A 10/04/2014    Procedure: DEBRIDMENT OF DECUBITUS ULCER;  Surgeon: Georganna Skeans, MD;  Location: [place];  Service: General;  Laterality: N/A;  . Laparoscopic diverted colostomy N/A 10/12/2014    Procedure: LAPAROSCOPIC DIVERTING COLOSTOMY;  Surgeon: Donnie Mesa, MD;  Location: [place];  Service: General;  Laterality: N/A;  . Insertion of suprapubic catheter N/A 10/12/2014    Procedure: INSERTION OF SUPRAPUBIC CATHETER;  Surgeon: Reece Packer, MD;  Location: [place];  Service: Urology;  Laterality: N/A;  . Incision and drainage of wound N/A 11/12/2014    Procedure: IRRIGATION AND DEBRIDEMENT OF WOUNDS WITH BONE BIOPSY AND SURGICAL PREP ;  Surgeon: Theodoro Kos, DO;  Location: [place];  Service: Plastics;  Laterality: N/A;  . Application of a-cell of back N/A 11/12/2014    Procedure: APPLICATION A CELL AND VAC ;  Surgeon: Theodoro Kos, DO;  Location: [place];  Service: Plastics;  Laterality: N/A;  . Incision and drainage of wound N/A 11/18/2014    Procedure: IRRIGATION AND DEBRIDEMENT OF SACRAL ULCER ONLY WITH PLACEMENT OF A CELL AND VAC/ DRESSING CHANGE TO UPPER BACK AREA.;  Surgeon:  Claire Sanger, DO;  Location: [place];  Service: Clinical cytogeneticist;  Laterality: N/A;  . Incision and drainage of wound N/A 11/25/2014    Procedure: IRRIGATION AND DEBRIDEMENT OF SACRAL ULCER AND BACK BURN WITH PLACEMENT OF A-CELL;  Surgeon: Theodoro Kos, DO;  Location: [place];  Service: Plastics;  Laterality: N/A;  . Minor application of wound vac N/A 11/25/2014    Procedure:  WOUND VAC CHANGE;  Surgeon: Theodoro Kos, DO;  Location: [place];  Service: Plastics;  Laterality: N/A;  . Incision and drainage of wound Right  12/08/2014    Procedure: IRRIGATION AND DEBRIDEMENT SACRAL WOUND AND RIGHT ISCHIAL WOUND ;  Surgeon: Theodoro Kos, DO;  Location: [place];  Service: Plastics;  Laterality: Right;  . Application of a-cell of back N/A 12/08/2014    Procedure: APPLICATION OF A-CELL AND WOUND VAC ;  Surgeon: Theodoro Kos, DO;  Location: [place];  Service: Plastics;  Laterality: N/A;   Family History  Problem Relation Age of Onset  . Diabetes Father   . Hypertension Father   . Asthma      fhx  . Hypertension      fhx  . Stroke      fhx  . Heart disease Mother    History  Substance Use Topics  . Smoking status: Former Smoker -- 1.00 packs/day for 12 years    Types: Cigars, Cigarettes    Quit date: 09/07/2014  . Smokeless tobacco: Never Used  . Alcohol Use: No    Review of Systems  All other systems reviewed and are negative.     Allergies  Cefuroxime axetil; Ertapenem; Morphine and related; Penicillins; Sulfa antibiotics; Tessalon; Shellfish allergy; and Miripirium  Home Medications   Prior to Admission medications   Medication Sig Start Date End Date Taking? Authorizing Provider  acetaminophen (TYLENOL) 325 MG tablet Take 2 tablets (650 mg total) by mouth every 6 (six) hours as needed for mild pain, moderate pain, fever or headache. 01/14/15  Yes Geradine Girt, DO  albuterol (PROVENTIL) (2.5 MG/3ML) 0.083% nebulizer solution Take 3 mLs (2.5 mg total) by nebulization every 4 (four) hours as needed for wheezing or shortness of breath. 04/27/14  Yes [name], MD  apixaban (ELIQUIS) 5 MG TABS tablet Take 1 tablet (5 mg total) by mouth 2 (two) times daily. 04/14/15  Yes [name], MD  B Complex-Folic Acid (B COMPLEX-VITAMIN B12 PO) Take 1 tablet by mouth at bedtime.   Yes Historical Provider, MD  baclofen (LIORESAL) 10 MG tablet Take 1-2 tablets (10-20 mg total) by mouth 3 (three) times daily. Takes 10mg  in morning and lunchtime and takes 20mg  at bedtime 11/29/14  Yes Meredith Staggers, MD  collagenase  (SANTYL) ointment Apply 1 application topically daily. 08/30/14  Yes [name], MD  diclofenac sodium (VOLTAREN) 1 % GEL Apply 2 g topically daily as needed (for pain). 11/19/14  Yes Shanker Kristeen Mans, MD  feeding supplement, GLUCERNA SHAKE, (GLUCERNA SHAKE) LIQD Take 237 mLs by mouth 3 (three) times daily between meals. 12/10/14  Yes Florencia Reasons, MD  fentaNYL (DURAGESIC - DOSED MCG/HR) 25 MCG/HR patch Place 1 patch (25 mcg total) onto the skin every 3 (three) days. 04/25/15  Yes Bayard Hugger, NP  glucagon (GLUCAGON EMERGENCY) 1 MG injection Inject 1 mg into the vein once as needed. 04/20/14  Yes [name], MD  insulin aspart (NOVOLOG) 100 UNIT/ML injection Inject 5 Units into the skin 3 (three) times daily with meals. 01/14/15  Yes Geradine Girt, DO  insulin glargine (LANTUS) 100 UNIT/ML injection Inject 0.3 mLs (30 Units total) into the skin at bedtime. Patient taking differently: Inject 20 Units into the skin at bedtime.  03/05/15  Yes Velvet Bathe, MD  loratadine (CLARITIN) 10 MG tablet Take 10 mg by mouth at bedtime.    Yes Historical Provider, MD  metoCLOPramide (REGLAN) 5 MG tablet Give 3 doses a week Patient taking differently: Take 5 mg by mouth 2 (two) times a week.  04/08/15  Yes [name], MD  midodrine (PROAMATINE) 10 MG tablet Take 1 tablet (10 mg total) by mouth 3 (three) times daily with meals. 04/04/15  Yes [name], MD  mirtazapine (REMERON) 7.5 MG tablet Take 1 tablet (7.5 mg total) by mouth at bedtime. 04/04/15  Yes [name], MD  Multiple Vitamin (MULTIVITAMIN WITH MINERALS) TABS tablet Take 1 tablet by mouth daily.   Yes Historical Provider, MD  mupirocin ointment (BACTROBAN) 2 % 1 application QD 0/17/51  Yes Historical Provider, MD  omeprazole (PRILOSEC) 40 MG capsule Take 40 mg by mouth at bedtime.    Yes Historical Provider, MD  ondansetron (ZOFRAN ODT) 8 MG disintegrating tablet Take 1 tablet (8 mg total) by mouth every 8 (eight) hours as needed for nausea or  vomiting. 12/29/14  Yes [name], MD  Oxycodone HCl 10 MG TABS Take 1 tablet (10 mg total) by mouth every 6 (six) hours as needed. Patient taking differently: Take 10-20 mg by mouth every 6 (six) hours as needed (pain). Pt can have 2 tablets when he goes to the wound center for dressing change 04/25/15  Yes Bayard Hugger, NP  pregabalin (LYRICA) 100 MG capsule Take 1 capsule (100 mg total) by mouth 3 (three) times daily. Take 100 mg by mouth in the morning, take 100 mg by mouth in the afternoon, take 100 mg by mouth in the evening and take 200 mg by mouth at bedtime. 03/05/15  Yes Velvet Bathe, MD  metroNIDAZOLE (FLAGYL) 500 MG tablet Take 1 tablet (500 mg total) by mouth every 8 (eight) hours. Patient not taking: Reported on 04/25/2015 03/05/15   Velvet Bathe, MD  nitrofurantoin, macrocrystal-monohydrate, (MACROBID) 100 MG capsule Take 1 capsule (100 mg total) by mouth 2 (two) times daily. Patient not taking: Reported on 04/25/2015 04/01/15   Montine Circle, PA-C  Vancomycin (VANCOCIN) 750 MG/150ML SOLN Inject 150 mLs (750 mg total) into the vein every 12 (twelve) hours. Patient not taking: Reported on 04/25/2015 03/05/15   Velvet Bathe, MD   BP 95/63 mmHg  Pulse 71  Temp(Src) 97.4 F (36.3 C) (Oral)  Resp 16  Ht 5\' 8"  (1.727 m)  Wt 110 lb (49.896 kg)  BMI 16.73 kg/m2  SpO2 100% Physical Exam  Constitutional:  Severe muscle wasting and atrophy.  HENT:  Head: Normocephalic and atraumatic.  Mouth/Throat: No oropharyngeal exudate.  Mucous membranes are severely dehydrated  Eyes: Conjunctivae and EOM are normal. Pupils are equal, round, and reactive to light. Right eye exhibits no discharge. Left eye exhibits no discharge. No scleral icterus.  Neck: Normal range of motion. Neck supple. No JVD present. No thyromegaly present.  Cardiovascular: Normal rate, regular rhythm, normal heart sounds and intact distal pulses.  Exam reveals no gallop and no friction rub.   No murmur  heard. Pulmonary/Chest: Effort normal and breath sounds normal. No respiratory distress. He has no wheezes. He has no rales.  Abdominal: Soft. Bowel sounds are normal. He exhibits no  distension and no mass. There is no tenderness.  Ostomy bag putting out soft yellow stool. Suprapubic catheter in place, no purulent drainage or redness, draining clear concentrated yellow urine  Musculoskeletal: He exhibits no edema or tenderness.  No edema, severe muscle wasting  Lymphadenopathy:    He has no cervical adenopathy.  Neurological: He is alert. Coordination normal.  Able to speak slowly and quietly, not able to move his legs, minimal movement of the arms, can move his head from side to side, no obvious facial droop  Skin: Skin is warm and dry. No rash noted. No erythema.  Psychiatric: He has a normal mood and affect. His behavior is normal.  Nursing note and vitals reviewed.   ED Course  Procedures (including critical care time) Labs Review Labs Reviewed  CBC - Abnormal; Notable for the following:    WBC 30.1 (*)    RBC 2.86 (*)    Hemoglobin 7.0 (*)    HCT 22.8 (*)    MCH 24.5 (*)    Platelets 732 (*)    All other components within normal limits  COMPREHENSIVE METABOLIC PANEL - Abnormal; Notable for the following:    Sodium 130 (*)    CO2 20 (*)    Glucose, Bld 116 (*)    Albumin 1.7 (*)    AST 12 (*)    ALT 7 (*)    Alkaline Phosphatase 160 (*)    All other components within normal limits  URINALYSIS, ROUTINE W REFLEX MICROSCOPIC (NOT AT [facility]) - Abnormal; Notable for the following:    Color, Urine AMBER (*)    APPearance CLOUDY (*)    Hgb urine dipstick SMALL (*)    Protein, ur 100 (*)    Nitrite POSITIVE (*)    Leukocytes, UA LARGE (*)    All other components within normal limits  MAGNESIUM - Abnormal; Notable for the following:    Magnesium 1.5 (*)    All other components within normal limits  URINE MICROSCOPIC-ADD ON - Abnormal; Notable for the following:    Bacteria,  UA MANY (*)    All other components within normal limits  URINE CULTURE  CULTURE, BLOOD (ROUTINE X 2)  CULTURE, BLOOD (ROUTINE X 2)  I-STAT CG4 LACTIC ACID, ED    Imaging Review Dg Chest Port 1 View  04/25/2015   CLINICAL DATA:  Hypotension  EXAM: PORTABLE CHEST - 1 VIEW  COMPARISON:  March 30, 2015  FINDINGS: There is bibasilar interstitial infiltrate. Lungs elsewhere clear. Heart size and pulmonary vascularity are normal. No adenopathy. 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  IMPRESSION: Bibasilar interstitial infiltrate. Suspect atypical pneumonia as most likely etiology. Differential considerations must include allergic type reaction or atypical noncardiogenic edema. Lungs elsewhere clear. No change in cardiac silhouette.   Electronically Signed   By: Lowella Grip III M.D.   On: 04/25/2015 16:03     EKG Interpretation None      MDM   Final diagnoses:  Hypotension  Sepsis, due to unspecified organism  HCAP (healthcare-associated pneumonia)  UTI (lower urinary tract infection)    The patient is severely hypotensive, last measured at 65/47, there is no tachycardia or fever, the patient's baseline is to be borderline hypotensive but he is much more than normal today. He appears severely dehydrated, needs fluids, rule out electrolyte imbalance or severe anemia, we'll anticipate admission to the hospital due to his decompensated state.  The patient has ongoing significant hypotension, he has improved somewhat with IV fluids but has an  elevated white blood cell count of 30,000, bilateral pneumonia on x-ray and a urinary tract infection. The patient does have increased drowsiness considered an altered mental status, I believe that he meets criteria for sepsis given his severe hypotension, he will be admitted to the hospital for antibiotics, discussed with hospitalist, stepdown bed ordered, antibiotics as below  Meds given in ED:  Medications  aztreonam (AZACTAM) 2 g in dextrose 5 %  50 mL IVPB (2 g Intravenous New Bag/Given 04/25/15 1723)  vancomycin (VANCOCIN) IVPB 750 mg/150 ml premix (not administered)  vancomycin (VANCOCIN) IVPB 750 mg/150 ml premix (not administered)  sodium chloride 0.9 % bolus 1,000 mL (1,000 mLs Intravenous New Bag/Given 04/25/15 1547)  sodium chloride 0.9 % bolus 1,000 mL (1,000 mLs Intravenous New Bag/Given 04/25/15 1650)    CRITICAL CARE Performed by: Noemi Chapel D Total critical care time: 35 Critical care time was exclusive of separately billable procedures and treating other patients. Critical care was necessary to treat or prevent imminent or life-threatening deterioration. Critical care was time spent personally by me on the following activities: development of treatment plan with patient and/or surrogate as well as nursing, discussions with consultants, evaluation of patient's response to treatment, examination of patient, obtaining history from patient or surrogate, ordering and performing treatments and interventions, ordering and review of laboratory studies, ordering and review of radiographic studies, pulse oximetry and re-evaluation of patient's condition.   Noemi Chapel, MD 04/25/15 1750

## 2015-04-26 DIAGNOSIS — T8351XD Infection and inflammatory reaction due to indwelling urinary catheter, subsequent encounter: Secondary | ICD-10-CM

## 2015-04-26 LAB — COMPREHENSIVE METABOLIC PANEL
ALT: 7 U/L — AB (ref 17–63)
ANION GAP: 5 (ref 5–15)
AST: 10 U/L — AB (ref 15–41)
Albumin: 1.3 g/dL — ABNORMAL LOW (ref 3.5–5.0)
Alkaline Phosphatase: 133 U/L — ABNORMAL HIGH (ref 38–126)
BUN: 21 mg/dL — AB (ref 6–20)
CHLORIDE: 112 mmol/L — AB (ref 101–111)
CO2: 17 mmol/L — ABNORMAL LOW (ref 22–32)
CREATININE: 0.52 mg/dL — AB (ref 0.61–1.24)
Calcium: 8.8 mg/dL — ABNORMAL LOW (ref 8.9–10.3)
GFR calc non Af Amer: >60 mL/min (ref >60)
GLUCOSE: 199 mg/dL — AB (ref 65–99)
Potassium: 3.9 mmol/L (ref 3.5–5.1)
SODIUM: 134 mmol/L — AB (ref 135–145)
TOTAL PROTEIN: 6 g/dL — AB (ref 6.5–8.1)
Total Bilirubin: 0.6 mg/dL (ref 0.3–1.2)

## 2015-04-26 LAB — CBC
HCT: 22.5 % — ABNORMAL LOW (ref 39.0–52.0)
HEMOGLOBIN: 6.9 g/dL — AB (ref 13.0–17.0)
MCH: 24.1 pg — ABNORMAL LOW (ref 26.0–34.0)
MCHC: 30.2 g/dL (ref 30.0–36.0)
MCV: 79.8 fL (ref 78.0–100.0)
Platelets: 773 10*3/uL — ABNORMAL HIGH (ref 150–400)
RBC: 2.82 MIL/uL — ABNORMAL LOW (ref 4.22–5.81)
RDW: 15.4 % (ref 11.5–15.5)
WBC: 35.2 10*3/uL — ABNORMAL HIGH (ref 4.0–10.5)

## 2015-04-26 LAB — GLUCOSE, CAPILLARY
Glucose-Capillary: 157 mg/dL — ABNORMAL HIGH (ref 65–99)
Glucose-Capillary: 150 mg/dL — ABNORMAL HIGH (ref 65–99)
Glucose-Capillary: 168 mg/dL — ABNORMAL HIGH (ref 65–99)

## 2015-04-26 LAB — ABO/RH: ABO/RH(D): O POS

## 2015-04-26 MED ORDER — LIDOCAINE HCL 2 % IJ SOLN
INTRAMUSCULAR | Status: AC
  Filled 2015-04-26: qty 20

## 2015-04-26 MED ORDER — GLUCERNA SHAKE PO LIQD
237.0000 mL | Freq: Three times a day (TID) | ORAL | Status: DC
  Administered 2015-04-26 – 2015-05-07 (×15): 237 mL via ORAL
  Filled 2015-04-26 (×32): qty 237

## 2015-04-26 MED ORDER — SODIUM CHLORIDE 0.9 % IJ SOLN
10.0000 mL | Freq: Two times a day (BID) | INTRAMUSCULAR | Status: DC
  Administered 2015-04-26 – 2015-04-28 (×3): 10 mL
  Administered 2015-04-28: 30 mL
  Administered 2015-04-29 – 2015-05-13 (×23): 10 mL

## 2015-04-26 MED ORDER — INSULIN GLARGINE 100 UNIT/ML ~~LOC~~ SOLN
8.0000 [IU] | Freq: Every day | SUBCUTANEOUS | Status: DC
  Administered 2015-04-26: 8 [IU] via SUBCUTANEOUS
  Filled 2015-04-26 (×2): qty 0.08

## 2015-04-26 MED ORDER — MUPIROCIN 2 % EX OINT
1.0000 "application " | TOPICAL_OINTMENT | Freq: Two times a day (BID) | CUTANEOUS | Status: AC
  Administered 2015-04-26 – 2015-04-30 (×10): 1 via NASAL
  Filled 2015-04-26 (×2): qty 22

## 2015-04-26 MED ORDER — OXYCODONE HCL 5 MG PO TABS
10.0000 mg | ORAL_TABLET | Freq: Four times a day (QID) | ORAL | Status: DC | PRN
  Administered 2015-04-26 – 2015-04-27 (×4): 10 mg via ORAL
  Filled 2015-04-26 (×4): qty 2

## 2015-04-26 MED ORDER — SODIUM CHLORIDE 0.9 % IV SOLN
Freq: Once | INTRAVENOUS | Status: AC
  Administered 2015-04-26: 08:00:00 via INTRAVENOUS

## 2015-04-26 MED ORDER — NOREPINEPHRINE BITARTRATE 1 MG/ML IV SOLN
0.0000 ug/min | INTRAVENOUS | Status: DC
  Administered 2015-04-26: 10 ug/min via INTRAVENOUS
  Filled 2015-04-26 (×2): qty 4

## 2015-04-26 MED ORDER — SODIUM CHLORIDE 0.9 % IJ SOLN
10.0000 mL | INTRAMUSCULAR | Status: DC | PRN
  Administered 2015-05-01 – 2015-05-07 (×3): 10 mL
  Filled 2015-04-26 (×3): qty 40

## 2015-04-26 MED ORDER — CHLORHEXIDINE GLUCONATE CLOTH 2 % EX PADS
6.0000 | MEDICATED_PAD | Freq: Every day | CUTANEOUS | Status: DC
  Administered 2015-04-26: 6 via TOPICAL

## 2015-04-26 MED ORDER — PRO-STAT SUGAR FREE PO LIQD
30.0000 mL | Freq: Three times a day (TID) | ORAL | Status: DC
  Administered 2015-04-26 – 2015-05-02 (×5): 30 mL via ORAL
  Filled 2015-04-26 (×12): qty 30

## 2015-04-26 NOTE — Consult Note (Addendum)
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  Full thickness tissue loss to mid back. Wound type:Pressure ulcers Pressure Ulcer POA: Yes Measurement:Sacrum 12 cm x 14 cm x  4 cm per flowsheet.  Mother indicates that [facility] dressing change was performed yesterday to sacrum and bilateral ischium and bridged to hip, and she would like all dressings to stay on Mon-Wed-Fri schedule. Left foot dressing is changed and assessed today.  VAC dressing intact and no leak present.  Will change Wednesday.   Wound WUJ:WJXB foot wounds are ruddy red.  Drainage (amount, consistency, odor) Moderate serosanguinous  No odor.  Periwound:Intact.  Healed lesions noted on bilateral lower extremities.  Dressing procedure/placement/frequency:Cleanse left foot lesions to dorsal foot, 5th metatarsal and left heel with NS and pat gently dry.  Apply Fibracol dressing to wound bed (family is providing- off formulary).  Cover with 4x4 gauze and secure with kerlix and tape.  Change Mon-WEd-Fri. Cleanse lesions to back with NS and pat gently dry.  Apply Xeroform gauze to wound bed.  Cover with ABD and secure with tape.  Change Mon-WEd-Fri. VAC dressing changes MOn-WEd-Fri to sacrum, bilateral ischium, bridged to trochanter.   WOC ostomy consult note Stoma type/location: LLQ Colostomy Stomal assessment/size: not assessed pouch intact.  (currently using a 1 piece flat) Peristomal assessment: intact, per mother  Well budded pink stoma Treatment options for stomal/peristomal skin: none needed Output Soft brown stool Ostomy pouching: 2pc. 2 1/4" pouch will be used at next pouch change.  Education provided: Supplies will be provided while in patient.  Mother verbalizes understanding.  Enrolled patient in [place] program: No Will not follow at this time.  Please re-consult if needed.  Domenic Moras RN BSN  [facility] Pager [phone]   [place] team will continue to follow and remain available to patient, medical and nursing teams.  Domenic Moras RN BSN [place] Pager [phone]

## 2015-04-26 NOTE — Progress Notes (Signed)
[facility] PROGRESS NOTE  Jason [name] ZWC:[id number] DOB: 1986/02/02 DOA: 04/25/2015 PCP: [name], MD  Assessment/Plan: 1. Healthcare associated pneumonia- chest x-ray shows bibasilar interstitial infiltrates, atypical pneumonia patient started on vancomycin and azactam. White count up to 35,000. Continue the same antibiotic. Follow blood culture results. 2. Anemia- patient has chronic anemia with hemoglobin range 7-8, this morning hemoglobin is 6.9. Transfuse 2 units PRBC 3. Hypotension- patient has chronic hypertension and is taking midodrine 10 mg by mouth 3 times a day at home. We'll start Levophed infusion with the goal of MAP  more than 65 4. UTI- patient has abnormal UA, started on IV antibiotic. Follow urine culture results. 5. Diabetes mellitus- continue sliding scale insulin with NovoLog 6. Sacral  decubitus ulcer- wound care following 7. Paraplegia- patient has ostomy, suprapubic catheter in place.  Code Status: Full code Family Communication: discussed with mother at bedside Disposition Plan: Home when medically stable   Consultants:  None  Procedures:  None   Antibiotics:  Vancomycin  Azactam  HPI/Subjective: 29 year old male who  has a past medical history of GERD (gastroesophageal reflux disease); Asthma; MRSA infection; Gastroparesis; Diabetic neuropathy; Seizures; Family history of anesthesia complication; Dysrhythmia; Pneumonia; Arthritis; Fibromyalgia; Stroke (01/29/2014); Type I diabetes mellitus; and Syncope (02/16/2015). *Patient has a history of C6 spinal cord injury has been wheelchair-bound. Patient was sent to the emergency room from the physical therapy office today after patient was found to be hypotensive. [name] says that he has not been eating and drinking well for past two weeks. In the ED patient was found to be extremely dehydrated, had white count of 20,000, chest x-ray showing pneumonia as well as abnormal UA  consistent with UTI. Patient denies any symptoms, no chest pain or shortness of breath no cough and no nausea vomiting or diarrhea. Patient has ostomy bag in place  This morning patient denies any complaints, continues to be hypotensive with as BP in 60 to 70s range. WBC up to 35,000  Objective: Filed Vitals:   04/26/15 1200  BP: 107/64  Pulse:   Temp: 98.7 F (37.1 C)  Resp: 16    Intake/Output Summary (Last 24 hours) at 04/26/15 1313 Last data filed at 04/26/15 1200  Gross per 24 hour  Intake 8222.5 ml  Output    300 ml  Net 7922.5 ml   Filed Weights   04/25/15 1711 04/25/15 1930  Weight: 49.896 kg (110 lb) 43.7 kg (96 lb 5.5 oz)    Exam:   General:  Appears in no acute distress  Cardiovascular: S1-S2 is regular in rate and rhythm  Respiratory: Clear to auscultation bilaterally, no wheezing or crackles  Abdomen: Soft, nontender, no organomegaly  Musculoskeletal: No edema of the lower extremities   Data Reviewed: Basic Metabolic Panel:  Recent Labs Lab 04/25/15 1612 04/26/15 0400  NA 130* 134*  K 4.7 3.9  CL 105 112*  CO2 20* 17*  GLUCOSE 116* 199*  BUN 20 21*  CREATININE 0.68 0.52*  CALCIUM 10.2 8.8*  MG 1.5*  --    Liver Function Tests:  Recent Labs Lab 04/25/15 1612 04/26/15 0400  AST 12* 10*  ALT 7* 7*  ALKPHOS 160* 133*  BILITOT 0.3 0.6  PROT 7.3 6.0*  ALBUMIN 1.7* 1.3*   No results for input(s): LIPASE, AMYLASE in the last 168 hours. No results for input(s): AMMONIA in the last 168 hours. CBC:  Recent Labs Lab 04/25/15 1612 04/26/15 0400  WBC 30.1* 35.2*  HGB 7.0* 6.9*  HCT 22.8* 22.5*  MCV 79.7 79.8  PLT 732* 773*   Cardiac Enzymes: No results for input(s): CKTOTAL, CKMB, CKMBINDEX, TROPONINI in the last 168 hours. BNP (last 3 results) No results for input(s): BNP in the last 8760 hours.  ProBNP (last 3 results) No results for input(s): PROBNP in the last 8760 hours.  CBG:  Recent Labs Lab 04/25/15 2138  04/26/15 0741  GLUCAP 188* 157*    Recent Results (from the past 240 hour(s))  MRSA PCR Screening     Status: Abnormal   Collection Time: 04/25/15  6:55 PM  Result Value Ref Range Status   MRSA by PCR POSITIVE (A) NEGATIVE Final    Comment:        The GeneXpert MRSA Assay (FDA approved for NASAL specimens only), is one component of a comprehensive MRSA colonization surveillance program. It is not intended to diagnose MRSA infection nor to guide or monitor treatment for MRSA infections. RESULT CALLED TO, READ BACK BY AND VERIFIED WITH: Karlene Einstein [id number] @ 2043 BY J SCOTTON      Studies: Dg Chest Port 1 View  04/25/2015   CLINICAL DATA:  Hypotension  EXAM: PORTABLE CHEST - 1 VIEW  COMPARISON:  March 30, 2015  FINDINGS: There is bibasilar interstitial infiltrate. Lungs elsewhere clear. Heart size and pulmonary vascularity are normal. No adenopathy. 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  IMPRESSION: Bibasilar interstitial infiltrate. Suspect atypical pneumonia as most likely etiology. Differential considerations must include allergic type reaction or atypical noncardiogenic edema. Lungs elsewhere clear. No change in cardiac silhouette.   Electronically Signed   By: Lowella Grip III M.D.   On: 04/25/2015 16:03    Scheduled Meds: . apixaban  5 mg Oral BID  . aztreonam  1 g Intravenous Q8H  . baclofen  10 mg Oral BID WC  . baclofen  20 mg Oral QHS  . Chlorhexidine Gluconate Cloth  6 each Topical Q0600  . [START ON 04/28/2015] fentaNYL  25 mcg Transdermal Q72H  . insulin aspart  0-9 Units Subcutaneous TID WC  . insulin glargine  20 Units Subcutaneous QHS  . loratadine  10 mg Oral QHS  . midodrine  10 mg Oral TID WC  . mirtazapine  7.5 mg Oral QHS  . mupirocin ointment  1 application Nasal BID  . pregabalin  100 mg Oral TID AC  . pregabalin  200 mg Oral QHS  . sodium chloride  10-40 mL Intracatheter Q12H  . vancomycin  750 mg Intravenous Q12H   Continuous  Infusions: . sodium chloride 150 mL/hr at 04/25/15 2300  . norepinephrine (LEVOPHED) Adult infusion 10 mcg/min (04/26/15 1030)    Active Problems:   Type 1 diabetes, uncontrolled, with neuropathy   Spinal cord infarction (history of)   Sepsis   Stage IV decubitus ulcer   Urinary tract infectious disease   HCAP (healthcare-associated pneumonia)    Time spent: 25 min    [facility] Pager [phone]. If 7PM-7AM, please contact night-coverage at [email or web address], password [facility] 04/26/2015, 1:13 PM  LOS: 1 day

## 2015-04-26 NOTE — Progress Notes (Signed)
Peripherally Inserted Central Catheter/Midline Placement  The IV Nurse has discussed with the patient and/or persons authorized to consent for the patient, the purpose of this procedure and the potential benefits and risks involved with this procedure.  The benefits include less needle sticks, lab draws from the catheter and patient may be discharged home with the catheter.  Risks include, but not limited to, infection, bleeding, blood clot (thrombus formation), and puncture of an artery; nerve damage and irregular heat beat.  Alternatives to this procedure were also discussed.  PICC/Midline Placement Documentation        Darlyn Read 04/26/2015, 11:20 AM

## 2015-04-26 NOTE — Progress Notes (Signed)
Initial Nutrition Assessment  DOCUMENTATION CODES:   Severe malnutrition in context of acute illness/injury, Underweight  INTERVENTION:  - Will order Glucerna Shake TID, each supplement provides 220 kcal and 10 grams of protein - Will order Prostat TID, each supplement provides 100 kcal and 15 grams of protein - Recommend liberalize to Regular diet - RD will continue to monitor for needs  NUTRITION DIAGNOSIS:   Increased nutrient needs related to wound healing as evidenced by estimated needs.  GOAL:   Patient will meet greater than or equal to 90% of their needs, Weight gain  MONITOR:   PO intake, Supplement acceptance, Weight trends, Labs  REASON FOR ASSESSMENT:   Malnutrition Screening Tool  ASSESSMENT:  29 year old male who  has a past medical history of GERD (gastroesophageal reflux disease); Asthma; MRSA infection; Gastroparesis; Diabetic neuropathy; Seizures; Family history of anesthesia complication; Dysrhythmia; Pneumonia; Arthritis; Fibromyalgia; Stroke (01/29/2014); Type I diabetes mellitus; and Syncope (02/16/2015). 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 Patient was sent to the emergency room from the physical therapy office today after patient was found to be hypotensive. [name] says that he has not been eating and drinking well for past two weeks. In the ED patient was found to be extremely dehydrated, had white count of 20,000, chest x-ray showing pneumonia as well as abnormal UA consistent with UTI.  Pt seen for MST. BMI indicates underweight status. Hx of paraplegia. Mother provides all information. She indicates that today she was told that pt has sores in his mouth making intakes painful and that he should avoid acidic items, carbonation (thrush?). For breakfast he had a few spoonfuls of oatmeal, half of a hard boiled egg, and a half of a canned pear. During visit, pt was eating lunch and had consumed ~1/3 of a hamburger  and was going to try some soup.   PTA, he had a poor appetite for 2-3 days that continued to decrease to the point of only eating 1 bite/meal. He denies abdominal pain, mouth pain, or nausea during this time and states he simply had no appetite.   Mother reports that pt lost 25 lbs in the past month and she is concerned. She asks for suggestions to increase protein and kcal. Encouraged her to prepare items using whole milk/heavy cream, adding sauces and gravies when possible, and also talked with her about Prostat/Prosource for home use. She states that pt is a picky eater overall. Pt was drinking, on average, 2 cans of Glucerna/day at home. Also informed pt's mother of services offered by [facility] and that ask MD or PCP for referral should she feel this would be beneficial for the pt/her.  Per weight hx review, pt has lost 34 lbs (26% body weight) in the past month. Severe muscle and fat wasting noted during physical assessment.  Not meeting needs. Medications reviewed. Labs reviewed; CBGs: 157 and 188 mg/dL, Cl: 112 mmol/L, BUN elevated, creatinine low, Alk Phos elevated, AST/ALT low.   Diet Order:  Diet Carb Modified Fluid consistency:: Thin; Room service appropriate?: Yes  Skin:  Wound (see comment) (Stage 4 sacral pressure ulcer, Stage 3 L heel pressure ulcer, Incision to back)  Last BM:  PTA  Height:   Ht Readings from Last 1 Encounters:  04/25/15 _0  (1.727 m)    Weight:   Wt Readings from Last 1 Encounters:  04/25/15 96 lb 5.5 oz (43.7 kg)    Ideal Body Weight:  70 kg (kg)  Wt Readings  from Last 10 Encounters:  04/25/15 96 lb 5.5 oz (43.7 kg)  04/01/15 130 lb (58.968 kg)  02/20/15 111 lb 15.9 oz (50.8 kg)  02/16/15 112 lb (50.803 kg)  01/12/15 128 lb 4.9 oz (58.2 kg)  12/19/14 130 lb 1.1 oz (59 kg)  11/25/14 155 lb (70.308 kg)  11/19/14 155 lb 13.8 oz (70.7 kg)  10/02/14 125 lb 10.6 oz (57 kg)  09/23/14 131 lb 1.6 oz (59.467 kg)    BMI:  Body mass index is 14.65  kg/(m^2).  Estimated Nutritional Needs:   Kcal:  6859-9234  Protein:  85-100 grams  Fluid:  2.5 L/day  EDUCATION NEEDS:   No education needs identified at this time    Jarome Matin, RD, LDN Inpatient Clinical Dietitian Pager # [phone] After hours/weekend pager # [phone]

## 2015-04-26 NOTE — Progress Notes (Signed)
CRITICAL VALUE ALERT  Critical value received: Positive blood culture. Aerobic Bottle-gram positive cocci in clusters.   Date of notification:  04/25/15  Time of notification:  1597  Critical value read back:Yes.    Nurse who received alert:  Duard Larsen, RN  MD notified (1st page):  LAMA

## 2015-04-26 NOTE — Progress Notes (Signed)
Update - Patient has been hypotensive during this evening requiring boluses for increasing BP. BP map 45-55 most of the evening.  Pt alert but sleepy.  On admission, patient has a left heel pressure ulcer dressing, a large sacral wound vac dressing in place and dry back dressing in place with with min serous drainage present.  Mother at bedside is his caregiver.  She said that he goes to the wound care clinic three times/ weekly and advanced home care sees her once a week.  She said there are several treatments for his wounds and not to change them this evening.  Ostomy consult is present and Ostomy RN should be here in the am.

## 2015-04-26 NOTE — Progress Notes (Signed)
Date:  April 26, 2015 U.R. performed for needs and level of care. Will continue to follow for Case Management needs.  Velva Harman, RN, BSN, [place]   [phone]

## 2015-04-26 NOTE — Care Management Note (Signed)
Case Management Note  Patient Details  Name: KHRIZ LIDDY MRN: [id number] Date of Birth: Aug 03, 1986  Subjective/Objective:                 Severe hypotension   Action/Plan: home   Expected Discharge Date:   (unknown)               Expected Discharge Plan:  Home/Self Care  In-House Referral:  NA  Discharge planning Services  CM Consult  Post Acute Care Choice:  NA Choice offered to:  NA  DME Arranged:  N/A DME Agency:  NA  HH Arranged:  NA HH Agency:  NA  Status of Service:  In process, will continue to follow  Medicare Important Message Given:    Date Medicare IM Given:    Medicare IM give by:    Date Additional Medicare IM Given:    Additional Medicare Important Message give by:     If discussed at [place] of Stay Meetings, dates discussed:    Additional Comments:  Leeroy Cha, RN 04/26/2015, 10:34 AM

## 2015-04-27 DIAGNOSIS — D72829 Elevated white blood cell count, unspecified: Secondary | ICD-10-CM

## 2015-04-27 DIAGNOSIS — B9561 Methicillin susceptible Staphylococcus aureus infection as the cause of diseases classified elsewhere: Secondary | ICD-10-CM

## 2015-04-27 DIAGNOSIS — E43 Unspecified severe protein-calorie malnutrition: Secondary | ICD-10-CM

## 2015-04-27 DIAGNOSIS — D696 Thrombocytopenia, unspecified: Secondary | ICD-10-CM

## 2015-04-27 DIAGNOSIS — N390 Urinary tract infection, site not specified: Secondary | ICD-10-CM

## 2015-04-27 DIAGNOSIS — G8250 Quadriplegia, unspecified: Secondary | ICD-10-CM

## 2015-04-27 DIAGNOSIS — L89159 Pressure ulcer of sacral region, unspecified stage: Secondary | ICD-10-CM

## 2015-04-27 DIAGNOSIS — Z933 Colostomy status: Secondary | ICD-10-CM

## 2015-04-27 DIAGNOSIS — M868X8 Other osteomyelitis, other site: Secondary | ICD-10-CM

## 2015-04-27 DIAGNOSIS — Z960 Presence of urogenital implants: Secondary | ICD-10-CM

## 2015-04-27 LAB — COMPREHENSIVE METABOLIC PANEL
ALBUMIN: 1.2 g/dL — AB (ref 3.5–5.0)
ALT: 7 U/L — AB (ref 17–63)
ANION GAP: 3 — AB (ref 5–15)
AST: 12 U/L — ABNORMAL LOW (ref 15–41)
Alkaline Phosphatase: 206 U/L — ABNORMAL HIGH (ref 38–126)
BUN: 14 mg/dL (ref 6–20)
CALCIUM: 8.8 mg/dL — AB (ref 8.9–10.3)
CHLORIDE: 117 mmol/L — AB (ref 101–111)
CO2: 17 mmol/L — ABNORMAL LOW (ref 22–32)
CREATININE: 0.4 mg/dL — AB (ref 0.61–1.24)
GFR calc non Af Amer: >60 mL/min (ref >60)
Glucose, Bld: 77 mg/dL (ref 65–99)
Potassium: 3.4 mmol/L — ABNORMAL LOW (ref 3.5–5.1)
SODIUM: 137 mmol/L (ref 135–145)
Total Bilirubin: 0.5 mg/dL (ref 0.3–1.2)
Total Protein: 6 g/dL — ABNORMAL LOW (ref 6.5–8.1)

## 2015-04-27 LAB — TYPE AND SCREEN
ABO/RH(D): O POS
ANTIBODY SCREEN: NEGATIVE
UNIT DIVISION: 0
Unit division: 0

## 2015-04-27 LAB — GLUCOSE, CAPILLARY
GLUCOSE-CAPILLARY: 114 mg/dL — AB (ref 65–99)
GLUCOSE-CAPILLARY: 39 mg/dL — AB (ref 65–99)
GLUCOSE-CAPILLARY: 90 mg/dL (ref 65–99)
GLUCOSE-CAPILLARY: 157 mg/dL — AB (ref 65–99)
Glucose-Capillary: 117 mg/dL — ABNORMAL HIGH (ref 65–99)
Glucose-Capillary: 122 mg/dL — ABNORMAL HIGH (ref 65–99)
Glucose-Capillary: 127 mg/dL — ABNORMAL HIGH (ref 65–99)
Glucose-Capillary: 136 mg/dL — ABNORMAL HIGH (ref 65–99)
Glucose-Capillary: 154 mg/dL — ABNORMAL HIGH (ref 65–99)
Glucose-Capillary: 58 mg/dL — ABNORMAL LOW (ref 65–99)
Glucose-Capillary: 96 mg/dL (ref 65–99)
Glucose-Capillary: <10 mg/dL — CL (ref 65–99)

## 2015-04-27 LAB — CBC
HCT: 30.6 % — ABNORMAL LOW (ref 39.0–52.0)
Hemoglobin: 9.7 g/dL — ABNORMAL LOW (ref 13.0–17.0)
MCH: 25.3 pg — ABNORMAL LOW (ref 26.0–34.0)
MCHC: 31.7 g/dL (ref 30.0–36.0)
MCV: 79.9 fL (ref 78.0–100.0)
PLATELETS: 755 10*3/uL — AB (ref 150–400)
RBC: 3.83 MIL/uL — ABNORMAL LOW (ref 4.22–5.81)
RDW: 15.7 % — ABNORMAL HIGH (ref 11.5–15.5)
WBC: 29.1 10*3/uL — ABNORMAL HIGH (ref 4.0–10.5)

## 2015-04-27 LAB — HEMOGLOBIN A1C
Hgb A1c MFr Bld: 8.3 % — ABNORMAL HIGH (ref 4.8–5.6)
Mean Plasma Glucose: 192 mg/dL

## 2015-04-27 MED ORDER — DEXTROSE 50 % IV SOLN
INTRAVENOUS | Status: AC
  Administered 2015-04-27: 25 mL
  Filled 2015-04-27: qty 50

## 2015-04-27 MED ORDER — POTASSIUM CHLORIDE CRYS ER 20 MEQ PO TBCR
40.0000 meq | EXTENDED_RELEASE_TABLET | Freq: Once | ORAL | Status: AC
  Administered 2015-04-27: 40 meq via ORAL
  Filled 2015-04-27: qty 2

## 2015-04-27 MED ORDER — KETOROLAC TROMETHAMINE 30 MG/ML IJ SOLN
15.0000 mg | Freq: Four times a day (QID) | INTRAMUSCULAR | Status: AC | PRN
  Administered 2015-04-27 – 2015-05-01 (×11): 15 mg via INTRAVENOUS
  Filled 2015-04-27 (×11): qty 1

## 2015-04-27 MED ORDER — OXYCODONE HCL 5 MG PO TABS
10.0000 mg | ORAL_TABLET | ORAL | Status: DC | PRN
  Administered 2015-04-27 – 2015-05-04 (×9): 10 mg via ORAL
  Filled 2015-04-27 (×12): qty 2

## 2015-04-27 MED ORDER — DEXTROSE 10 % IV SOLN
INTRAVENOUS | Status: DC
  Administered 2015-04-27: 21:00:00 via INTRAVENOUS
  Administered 2015-04-28: 50 mL/h via INTRAVENOUS

## 2015-04-27 MED ORDER — CHLORHEXIDINE GLUCONATE CLOTH 2 % EX PADS
6.0000 | MEDICATED_PAD | Freq: Every day | CUTANEOUS | Status: AC
  Administered 2015-04-29 – 2015-04-30 (×2): 6 via TOPICAL

## 2015-04-27 NOTE — Progress Notes (Signed)
Pt had hypoglycemic episode this morning with a blood sugar of 39. Hypoglycemia protocol was followed and Dextrose 50% 25 ml was given at 0745. The blood sugar was repeated at 0800 and the CBG was 90. Pt was alert and able to eat breakfast after this. PT A/O with stable VS. Will continue to monitor.
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
## 2015-04-27 NOTE — Clinical Documentation Improvement (Signed)
ED notes patient with  "Severe muscle wasting and atrophy" Severely dehydrated" BMI 16.73 (ht:5'8" wt: 110 lb) H&P note: "Constitutional: Patient is a malnourished-appearing male"  Please clarify if patient meets any clinical condition listed below.  . Severity of malnutrition --Mild (first degree) --Moderate (second degree) --Severe (third degree) . Avoid documenting a range of severity, such as "moderate to severe"  --Other . Document any associated diagnoses/conditions:  Supporting Information:Gastroparesis, GERD,  Pt. meds include mulitple vitamin with minerals, Prilosec, Zofran, Reglan, Glucerna TID Family reports he has not been eating and drinking well for past two weeks  Thank [name] Coombs, RN, BSN, [place].

## 2015-04-27 NOTE — Progress Notes (Addendum)
ANTIBIOTIC CONSULT NOTE - Follow Up  Pharmacy Consult for Vancomycin, Aztreonam Indication: pneumonia  Allergies  Allergen Reactions  . Cefuroxime Axetil Anaphylaxis  . Ertapenem Other (See Comments)    Rash and confusion  . Morphine And Related Other (See Comments)    Changed mental status, confusion, headache, visual hallucination  . Penicillins Anaphylaxis and Other (See Comments)    ?can take amoxicillin?  . Sulfa Antibiotics Anaphylaxis, Shortness Of Breath and Other (See Comments)  . Tessalon [Benzonatate] Anaphylaxis  . Shellfish Allergy Itching and Other (See Comments)    Took benadryl to alleviate reaction  . Miripirium Rash and Other (See Comments)    Change in mental status    Patient Measurements: Height: 5\' 8"  (172.7 cm) Weight: 96 lb 5.5 oz (43.7 kg) IBW/kg (Calculated) : 68.4  Vital Signs: Temp: 98.6 F (37 C) (07/20 0800) Temp Source: Oral (07/20 0800) BP: 109/70 mmHg (07/20 0700) Intake/Output from previous day: 07/19 0701 - 07/20 0700 In: 4254.9 [P.O.:60; I.V.:3122.4; Blood:772.5; IV EPPIRJJOA:416] Out: 606 [Urine:580; Stool:375] Intake/Output from this shift:    Labs:  Recent Labs  04/25/15 1612 04/26/15 0400 04/27/15 0815  WBC 30.1* 35.2* 29.1*  HGB 7.0* 6.9* 9.7*  PLT 732* 773* 755*  CREATININE 0.68 0.52* 0.40*   Estimated Creatinine Clearance: 83.5 mL/min (by C-G formula based on Cr of 0.4). No results for input(s): VANCOTROUGH, VANCOPEAK, VANCORANDOM, GENTTROUGH, GENTPEAK, GENTRANDOM, TOBRATROUGH, TOBRAPEAK, TOBRARND, AMIKACINPEAK, AMIKACINTROU, AMIKACIN in the last 72 hours.   Microbiology: Recent Results (from the past 720 hour(s))  Urine culture     Status: None   Collection Time: 03/30/15  8:05 PM  Result Value Ref Range Status   Specimen Description URINE, CLEAN CATCH  Final   Special Requests NONE  Final   Culture   Final    >=100,000 COLONIES/mL ESCHERICHIA COLI Performed at [facility]    Report Status 04/01/2015  FINAL  Final   Organism ID, Bacteria ESCHERICHIA COLI  Final      Susceptibility   Escherichia coli - MIC*    AMPICILLIN <=2 SENSITIVE Sensitive     CEFAZOLIN <=4 SENSITIVE Sensitive     CEFTRIAXONE <=1 SENSITIVE Sensitive     CIPROFLOXACIN >=4 RESISTANT Resistant     GENTAMICIN <=1 SENSITIVE Sensitive     IMIPENEM <=0.25 SENSITIVE Sensitive     NITROFURANTOIN <=16 SENSITIVE Sensitive     TRIMETH/SULFA <=20 SENSITIVE Sensitive     AMPICILLIN/SULBACTAM <=2 SENSITIVE Sensitive     PIP/TAZO <=4 SENSITIVE Sensitive     * >=100,000 COLONIES/mL ESCHERICHIA COLI  Culture, blood (routine x 2)     Status: None   Collection Time: 03/30/15  8:23 PM  Result Value Ref Range Status   Specimen Description BLOOD LAC  Final   Special Requests BOTTLES DRAWN AEROBIC AND ANAEROBIC 5CC  Final   Culture  Setup Time   Final    GRAM POSITIVE COCCI IN CLUSTERS AEROBIC BOTTLE ONLY CALLED TO ASHUE(MLT) BY [place] 04/01/15 AT 7:41AM CRITICAL RESULT CALLED TO, READ BACK AND VERIFIED WITH J. HAMILTON RN AT 0745 ON 06.24.16 BY SHUEA CONFIRMED BY KBARR,MT    Culture   Final    METHICILLIN RESISTANT STAPHYLOCOCCUS AUREUS Performed at [facility]    Report Status 04/05/2015 FINAL  Final   Organism ID, Bacteria METHICILLIN RESISTANT STAPHYLOCOCCUS AUREUS  Final      Susceptibility   Methicillin resistant staphylococcus aureus - MIC*    CIPROFLOXACIN >=8 RESISTANT Resistant     ERYTHROMYCIN >=8 RESISTANT  Resistant     GENTAMICIN <=0.5 SENSITIVE Sensitive     OXACILLIN >=4 RESISTANT Resistant     TETRACYCLINE <=1 SENSITIVE Sensitive     VANCOMYCIN 1 SENSITIVE Sensitive     TRIMETH/SULFA >=320 RESISTANT Resistant     CLINDAMYCIN >=8 RESISTANT Resistant     RIFAMPIN <=0.5 SENSITIVE Sensitive     Inducible Clindamycin NEGATIVE Sensitive     * METHICILLIN RESISTANT STAPHYLOCOCCUS AUREUS  Culture, blood (routine x 2)     Status: None   Collection Time: 03/30/15  8:31 PM  Result Value Ref Range Status    Specimen Description BLOOD RPICC  Final   Special Requests BOTTLES DRAWN AEROBIC AND ANAEROBIC 5CC  Final   Culture   Final    NO GROWTH 5 DAYS Performed at [facility]    Report Status 04/04/2015 FINAL  Final  Urine culture     Status: None (Preliminary result)   Collection Time: 04/25/15  4:35 PM  Result Value Ref Range Status   Specimen Description URINE, CLEAN CATCH  Final   Special Requests Normal  Final   Culture   Final    CULTURE REINCUBATED FOR BETTER GROWTH Performed at [facility]    Report Status PENDING  Incomplete  Blood culture (routine x 2)     Status: None (Preliminary result)   Collection Time: 04/25/15  5:00 PM  Result Value Ref Range Status   Specimen Description BLOOD RIGHT ANTECUBITAL  Final   Special Requests BOTTLES DRAWN AEROBIC AND ANAEROBIC 5 CC EA  Final   Culture  Setup Time   Final    GRAM POSITIVE COCCI IN CLUSTERS Gram Stain Report Called to,Read Back By and Verified With: Elveria Rising RN 17:30 04/26/15 ([email or web address]) AEROBIC BOTTLE ONLY    Culture   Final    STAPHYLOCOCCUS AUREUS Performed at [facility]    Report Status PENDING  Incomplete  Blood culture (routine x 2)     Status: None (Preliminary result)   Collection Time: 04/25/15  5:16 PM  Result Value Ref Range Status   Specimen Description BLOOD BLOOD RIGHT FOREARM  Final   Special Requests BOTTLES DRAWN AEROBIC AND ANAEROBIC 5 CC EA  Final   Culture  Setup Time   Final    GRAM POSITIVE COCCI IN CLUSTERS AEROBIC BOTTLE ONLY CRITICAL RESULT CALLED TO, READ BACK BY AND VERIFIED WITH: S GRAHAM 04/26/15 @ 59 M VESTAL    Culture   Final    STAPHYLOCOCCUS AUREUS Performed at [facility]    Report Status PENDING  Incomplete  MRSA PCR Screening     Status: Abnormal   Collection Time: 04/25/15  6:55 PM  Result Value Ref Range Status   MRSA by PCR POSITIVE (A) NEGATIVE Final    Comment:        The GeneXpert MRSA Assay (FDA approved for NASAL specimens only),  is one component of a comprehensive MRSA colonization surveillance program. It is not intended to diagnose MRSA infection nor to guide or monitor treatment for MRSA infections. RESULT CALLED TO, READ BACK BY AND VERIFIED WITH: Sharyn Lull REEVES,RN [id number] @ 2043 BY J SCOTTON     Medical History: Past Medical History  Diagnosis Date  . GERD (gastroesophageal reflux disease)   . Asthma   . Hx MRSA infection     on face  . Gastroparesis   . Diabetic neuropathy   . Seizures   . Family history of anesthesia complication     Pt  mother can't have epidural procedures  . Dysrhythmia   . Pneumonia   . Arthritis   . Fibromyalgia   . 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 Type I diabetes mellitus     sees Dr. Loanne Drilling   . Syncope 02/16/2015    Medications:  Scheduled:  . apixaban  5 mg Oral BID  . aztreonam  1 g Intravenous Q8H  . baclofen  10 mg Oral BID WC  . baclofen  20 mg Oral QHS  . [START ON 04/28/2015] Chlorhexidine Gluconate Cloth  6 each Topical Q0600  . feeding supplement (GLUCERNA SHAKE)  237 mL Oral TID BM  . feeding supplement (PRO-STAT SUGAR FREE 64)  30 mL Oral TID BM  . [START ON 04/28/2015] fentaNYL  25 mcg Transdermal Q72H  . insulin aspart  0-9 Units Subcutaneous TID WC  . insulin glargine  8 Units Subcutaneous QHS  . loratadine  10 mg Oral QHS  . midodrine  10 mg Oral TID WC  . mirtazapine  7.5 mg Oral QHS  . mupirocin ointment  1 application Nasal BID  . pregabalin  100 mg Oral TID AC  . pregabalin  200 mg Oral QHS  . sodium chloride  10-40 mL Intracatheter Q12H  . vancomycin  750 mg Intravenous Q12H   Infusions:  . sodium chloride 150 mL/hr (04/27/15 0503)  . norepinephrine (LEVOPHED) Adult infusion 2 mcg/min (04/27/15 0645)   PRN:   Assessment: 29 yo male with history of a C6 spinal cord injury, wheelchair bound, with multiple medical problems with frequent visits to the ED. He presents from the wound care clinic with  hypotension (BP 60/47). CXR shows bibasilar interstitial infiltrate - suspect atypical pneumonia.  EDP has ordered aztreonam x 1 dose and Pharmacy is consulted to dose vancomycin.  Tmax: spite 101 7/19 PM WBC elevated but improved to 29k SCr 0.52 with Crcl ~ 83 (>100 N, but note LOW weight with severe muscle wasting) Lactic acid: 1.23 (7/18)  7/18 >> Vancomycin >> 7/18 >> Aztreonam >>  7/18 blood: 2 of 2 Staph aureus (sens pending) 7/18 urine: reincubating 7/18 MRSA: positive  Dose changes/levels: 7/20 VT at 19:30 = ____ before 5th dose of 750mg  q12h  Goal of Therapy:  Vancomycin trough level 15-20 mcg/ml  Aztreonam dose per renal function  Plan:   Continue Vancomycin 750mg  IV q12h Check trough tonight at 19:30 at steady state Follow up renal function & cultures, clinical course Follow up ID recommendations for treatment of Staph aureus bacteremia (Dr. Johnnye Sima aware)  Peggyann Juba, PharmD, BCPS Pager: [phone] 04/27/2015,9:35 AM

## 2015-04-27 NOTE — Clinical Documentation Improvement (Signed)
Pt. with history of C6 spinal cord injury has been wheelchair-bound. Progress notes pt.  Paraplegia and pt. Past medical history notes spinal stroke ; "quadriplegic since"   Please specify possible conditions. _______________paraplegic, incomplete vs complete _______________quadriplegic, incomplete vs complete  Other Not able to determine   Thank you,  Philippa Chester ,RN Clinical Documentation Specialist:  [place] [employer]

## 2015-04-27 NOTE — Progress Notes (Signed)
Pt had a hypoglycemic episode at 1156 of 58. Pt was told to drink glucerna shake, and given some prostat. CBG was rechecked at 1230 and was 122.

## 2015-04-27 NOTE — Consult Note (Addendum)
WOC wound consult note Reason for Consult: Full thickness tissue loss to left and mid back.  Began as blistering, per mother.  Sacral Stage IV pressure ulcer Left ischium Stage IV pressure ulcer Right ischium Stage IV pressure ulcer Left foot wounds, assessed yesterday, see note. Wound type:Full thickness tissue loss Pressure Ulcer POA: Yes Measurement:Left and mid back 12cm x 14 cm x 0.3 cm Sacrum 7 cm x 5 cm x 4 cm Left ischium 6 cm x 4 cm x 3.5 cm Right ischium 7 cm x 4 cm x 3 cm Wound bed: left and mid back 100% pale pink and moist with periwound scarring Sacrum and bilateral ischium are beefy red, bleeds easily Drainage (amount, consistency, odor) Bleeds easily with cleansing Musty odor Periwound: Intact to sacrum and ischium Scarring from previous wounds to back   Wounds are assessed and dressings changed today.  1 piece black foam in sacral and each ischial wound, with black foam used to bridge.  A Y connector is used on right ischium wound today.  Patient is in bed and will not be able to get up to chair.  Due to friability of wounds, I felt this was better.  Dressing procedure/placement/frequency:Cleanse wounds to back with NS and pat gently dry.  Apply vaseline gauze to nonintact lesions.  Cover with ABd pad and tape.  Change Mon-WEd-Fri  Cleanse wounds to sacrum and bilateral ischium with NS and pat gently dry.  Bridge dressing to trochanter.  Change Mon-Wed-Fri.  WOC ostomy follow up Stoma type/location:  LLQ Colostomy Stomal assessment/size: 1 " pink moist and well budded Peristomal assessment: Intact  Scarring from medical adhesive related skin injury Treatment options for stomal/peristomal skin: Barrier ring Output loose yellow stool Ostomy pouching: 2pc. 2 1/4" pouch replaced with barrier ring  Education provided: None Enrolled patient in [employer] Discharge program: No   Will not follow at this time.  Please re-consult if needed.  Domenic Moras RN BSN  [place] Pager [phone]

## 2015-04-27 NOTE — Progress Notes (Signed)
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 He was admitted for further evaluation of hypotension.   Assessment/Plan: 1. CXR findings of bibasilar infiltrate concerning for atypical pneumonia, without any symptomatology of pneumonia.  Started on on IV vancomycin and IV azactum.  Febrile overnight, with elevated WBC count in 35,000 on admission, improved to 29,000 today.  Repeat blood cultures today and resume antibiotics.    2. Gram positive bacteremia:  On IV vancomycin.  Will request cardiology for TEE.   Sacral decubitus ulcer: Wound care following.  MRI of the sacrum ordered as per recommendations of ID.    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  He resides at home and mother is the caregiver.    UTI ? Vs colonization: - awaiting urine cultures.   Type 1 DM: CBG (last 3)   Recent Labs  04/27/15 1156 04/27/15 1230 04/27/15 1620  GLUCAP 58* 122* 136*       Code Status: FULL CODE.  Family Communication: mother at bedside Disposition Plan: pending further work up.    Consultants:  INFECTIOUS DISEASE.   Procedures:  MRI of the pelvis / sacrum  Antibiotics:  Vancomycin   Aztreonam.  HPI/Subjective: Reports pain in the back.   Objective: Filed Vitals:   04/27/15 1600  BP: 121/93  Pulse:   Temp: 98.6 F (37 C)  Resp:     Intake/Output Summary (Last 24 hours) at 04/27/15 1714 Last data filed at 04/27/15 1600  Gross per 24 hour  Intake 4555.68 ml  Output   1360 ml  Net 3195.68 ml   Filed Weights   04/25/15 1711 04/25/15 1930  Weight: 49.896 kg (110 lb) 43.7 kg (96 lb 5.5 oz)    Exam:   General:  Lethargic,but answering questions  appropriately  Cardiovascular: s1s2  Respiratory: diminished at bases, no wheezing or rhonchi  Abdomen: soft non tender non distended bowel sounds heard  Musculoskeletal: no edema , left foot ulcer on heel.   Data Reviewed: Basic Metabolic Panel:  Recent Labs Lab 04/25/15 1612 04/26/15 0400 04/27/15 0815  NA 130* 134* 137  K 4.7 3.9 3.4*  CL 105 112* 117*  CO2 20* 17* 17*  GLUCOSE 116* 199* 77  BUN 20 21* 14  CREATININE 0.68 0.52* 0.40*  CALCIUM 10.2 8.8* 8.8*  MG 1.5*  --   --    Liver Function Tests:  Recent Labs Lab 04/25/15 1612 04/26/15 0400 04/27/15 0815  AST 12* 10* 12*  ALT 7* 7* 7*  ALKPHOS 160* 133* 206*  BILITOT 0.3 0.6 0.5  PROT 7.3 6.0* 6.0*  ALBUMIN 1.7* 1.3* 1.2*   No results for input(s): LIPASE, AMYLASE in the last 168 hours. No results for input(s): AMMONIA in the last 168 hours. CBC:  Recent Labs Lab 04/25/15 1612 04/26/15 0400 04/27/15 0815  WBC 30.1* 35.2* 29.1*  HGB 7.0* 6.9* 9.7*  HCT 22.8* 22.5* 30.6*  MCV 79.7 79.8 79.9  PLT 732* 773* 755*   Cardiac Enzymes: No results for input(s): CKTOTAL, CKMB, CKMBINDEX, TROPONINI in the last 168 hours. BNP (last 3 results) No results for input(s): BNP in the last 8760 hours.  ProBNP (last 3 results) No results for input(s): PROBNP in the last 8760 hours.  CBG:  Recent Labs Lab 04/27/15 0745 04/27/15  4097 04/27/15 1156 04/27/15 1230 04/27/15 1620  GLUCAP <10* 90 58* 122* 136*    Recent Results (from the past 240 hour(s))  Urine culture     Status: None (Preliminary result)   Collection Time: 04/25/15  4:35 PM  Result Value Ref Range Status   Specimen Description URINE, CLEAN CATCH  Final   Special Requests Normal  Final   Culture   Final    >=100,000 COLONIES/mL ESCHERICHIA COLI Performed at [facility]    Report Status PENDING  Incomplete  Blood culture (routine x 2)     Status: None (Preliminary result)   Collection Time: 04/25/15  5:00 PM  Result Value  Ref Range Status   Specimen Description BLOOD RIGHT ANTECUBITAL  Final   Special Requests BOTTLES DRAWN AEROBIC AND ANAEROBIC 5 CC EA  Final   Culture  Setup Time   Final    GRAM POSITIVE COCCI IN CLUSTERS Gram Stain Report Called to,Read Back By and Verified With: Elveria Rising RN 17:30 04/26/15 ([email or web address]) AEROBIC BOTTLE ONLY    Culture   Final    STAPHYLOCOCCUS AUREUS Performed at [facility]    Report Status PENDING  Incomplete  Blood culture (routine x 2)     Status: None (Preliminary result)   Collection Time: 04/25/15  5:16 PM  Result Value Ref Range Status   Specimen Description BLOOD BLOOD RIGHT FOREARM  Final   Special Requests BOTTLES DRAWN AEROBIC AND ANAEROBIC 5 CC EA  Final   Culture  Setup Time   Final    GRAM POSITIVE COCCI IN CLUSTERS AEROBIC BOTTLE ONLY CRITICAL RESULT CALLED TO, READ BACK BY AND VERIFIED WITH: S GRAHAM 04/26/15 @ 35 M VESTAL    Culture   Final    STAPHYLOCOCCUS AUREUS Performed at [facility]    Report Status PENDING  Incomplete  MRSA PCR Screening     Status: Abnormal   Collection Time: 04/25/15  6:55 PM  Result Value Ref Range Status   MRSA by PCR POSITIVE (A) NEGATIVE Final    Comment:        The GeneXpert MRSA Assay (FDA approved for NASAL specimens only), is one component of a comprehensive MRSA colonization surveillance program. It is not intended to diagnose MRSA infection nor to guide or monitor treatment for MRSA infections. RESULT CALLED TO, READ BACK BY AND VERIFIED WITH: Sharyn Lull REEVES,RN [id number] @ 2043 BY J SCOTTON      Studies: No results found.  Scheduled Meds: . apixaban  5 mg Oral BID  . aztreonam  1 g Intravenous Q8H  . baclofen  10 mg Oral BID WC  . baclofen  20 mg Oral QHS  . [START ON 04/28/2015] Chlorhexidine Gluconate Cloth  6 each Topical Q0600  . feeding supplement (GLUCERNA SHAKE)  237 mL Oral TID BM  . feeding supplement (PRO-STAT SUGAR FREE 64)  30 mL Oral TID BM  . [START ON 04/28/2015]  fentaNYL  25 mcg Transdermal Q72H  . insulin aspart  0-9 Units Subcutaneous TID WC  . insulin glargine  8 Units Subcutaneous QHS  . loratadine  10 mg Oral QHS  . midodrine  10 mg Oral TID WC  . mirtazapine  7.5 mg Oral QHS  . mupirocin ointment  1 application Nasal BID  . pregabalin  100 mg Oral TID AC  . pregabalin  200 mg Oral QHS  . sodium chloride  10-40 mL Intracatheter Q12H  . vancomycin  750 mg Intravenous Q12H  Continuous Infusions: . sodium chloride 150 mL/hr at 04/27/15 1209  . norepinephrine (LEVOPHED) Adult infusion 2 mcg/min (04/27/15 0700)    Active Problems:   Type 1 diabetes, uncontrolled, with neuropathy   Spinal cord infarction (history of)   Sepsis   Stage IV decubitus ulcer   Urinary tract infectious disease   HCAP (healthcare-associated pneumonia)    Time spent:35 min    Anu Stagner  [facility] Pager [phone]  If 7PM-7AM, please contact night-coverage at [email or web address], password [facility] 04/27/2015, 5:14 PM  LOS: 2 days

## 2015-04-28 ENCOUNTER — Inpatient Hospital Stay (HOSPITAL_COMMUNITY): Payer: Medicaid Other

## 2015-04-28 ENCOUNTER — Encounter (HOSPITAL_COMMUNITY): Payer: Self-pay

## 2015-04-28 DIAGNOSIS — B9620 Unspecified Escherichia coli [E. coli] as the cause of diseases classified elsewhere: Secondary | ICD-10-CM

## 2015-04-28 DIAGNOSIS — M869 Osteomyelitis, unspecified: Secondary | ICD-10-CM

## 2015-04-28 LAB — GLUCOSE, CAPILLARY
GLUCOSE-CAPILLARY: 164 mg/dL — AB (ref 65–99)
Glucose-Capillary: 103 mg/dL — ABNORMAL HIGH (ref 65–99)
Glucose-Capillary: 168 mg/dL — ABNORMAL HIGH (ref 65–99)
Glucose-Capillary: 140 mg/dL — ABNORMAL HIGH (ref 65–99)
Glucose-Capillary: 144 mg/dL — ABNORMAL HIGH (ref 65–99)
Glucose-Capillary: 171 mg/dL — ABNORMAL HIGH (ref 65–99)

## 2015-04-28 LAB — COMPREHENSIVE METABOLIC PANEL
ALBUMIN: 1.1 g/dL — AB (ref 3.5–5.0)
ALT: 7 U/L — ABNORMAL LOW (ref 17–63)
AST: 8 U/L — ABNORMAL LOW (ref 15–41)
Alkaline Phosphatase: 173 U/L — ABNORMAL HIGH (ref 38–126)
Anion gap: 4 — ABNORMAL LOW (ref 5–15)
BUN: 11 mg/dL (ref 6–20)
CO2: 15 mmol/L — ABNORMAL LOW (ref 22–32)
CREATININE: 0.42 mg/dL — AB (ref 0.61–1.24)
Calcium: 8.2 mg/dL — ABNORMAL LOW (ref 8.9–10.3)
Chloride: 117 mmol/L — ABNORMAL HIGH (ref 101–111)
GFR calc Af Amer: >60 mL/min (ref >60)
GFR calc non Af Amer: >60 mL/min (ref >60)
Glucose, Bld: 155 mg/dL — ABNORMAL HIGH (ref 65–99)
Potassium: 3.5 mmol/L (ref 3.5–5.1)
SODIUM: 136 mmol/L (ref 135–145)
Total Bilirubin: 0.4 mg/dL (ref 0.3–1.2)
Total Protein: 5.3 g/dL — ABNORMAL LOW (ref 6.5–8.1)

## 2015-04-28 LAB — CBC
HCT: 28.9 % — ABNORMAL LOW (ref 39.0–52.0)
Hemoglobin: 9.3 g/dL — ABNORMAL LOW (ref 13.0–17.0)
MCH: 25.4 pg — AB (ref 26.0–34.0)
MCHC: 32.2 g/dL (ref 30.0–36.0)
MCV: 79 fL (ref 78.0–100.0)
PLATELETS: 625 10*3/uL — AB (ref 150–400)
RBC: 3.66 MIL/uL — AB (ref 4.22–5.81)
RDW: 15.6 % — ABNORMAL HIGH (ref 11.5–15.5)
WBC: 31.1 10*3/uL — ABNORMAL HIGH (ref 4.0–10.5)

## 2015-04-28 LAB — VANCOMYCIN, TROUGH: Vancomycin Tr: 25 ug/mL — ABNORMAL HIGH (ref 10.0–20.0)

## 2015-04-28 MED ORDER — MIDODRINE HCL 5 MG PO TABS
20.0000 mg | ORAL_TABLET | Freq: Three times a day (TID) | ORAL | Status: DC
  Administered 2015-04-29 – 2015-06-01 (×93): 20 mg via ORAL
  Filled 2015-04-28 (×117): qty 4

## 2015-04-28 MED ORDER — VANCOMYCIN HCL IN DEXTROSE 750-5 MG/150ML-% IV SOLN
750.0000 mg | INTRAVENOUS | Status: DC
  Administered 2015-04-28 – 2015-05-03 (×6): 750 mg via INTRAVENOUS
  Filled 2015-04-28 (×7): qty 150

## 2015-04-28 MED ORDER — MIDODRINE HCL 5 MG PO TABS
10.0000 mg | ORAL_TABLET | Freq: Once | ORAL | Status: AC
Start: 2015-04-28 — End: 2015-04-28
  Administered 2015-04-28: 10 mg via ORAL
  Filled 2015-04-28: qty 2

## 2015-04-28 MED ORDER — NOREPINEPHRINE BITARTRATE 1 MG/ML IV SOLN
0.0000 ug/min | INTRAVENOUS | Status: DC
  Administered 2015-04-29 – 2015-05-02 (×4): 2 ug/min via INTRAVENOUS
  Filled 2015-04-28 (×4): qty 4

## 2015-04-28 NOTE — Progress Notes (Signed)
INFECTIOUS DISEASE PROGRESS NOTE  ID: Jason [name] is a 29 y.o. male with  Active Problems:   Type 1 diabetes, uncontrolled, with neuropathy   Spinal cord infarction (history of)   Sepsis   Stage IV decubitus ulcer   Urinary tract infectious disease   HCAP (healthcare-associated pneumonia)  Subjective: Sleeping, awakens easily.   Abtx:  Anti-infectives    Start     Dose/Rate Route Frequency Ordered Stop   04/28/15 2000  vancomycin (VANCOCIN) IVPB 750 mg/150 ml premix     750 mg 150 mL/hr over 60 Minutes Intravenous Every 24 hours 04/28/15 0731     04/26/15 0800  vancomycin (VANCOCIN) IVPB 750 mg/150 ml premix  Status:  Discontinued     750 mg 150 mL/hr over 60 Minutes Intravenous Every 12 hours 04/25/15 1715 04/28/15 0731   04/26/15 0200  aztreonam (AZACTAM) 1 g in dextrose 5 % 50 mL IVPB     1 g 100 mL/hr over 30 Minutes Intravenous Every 8 hours 04/25/15 1919     04/25/15 1715  vancomycin (VANCOCIN) IVPB 750 mg/150 ml premix     750 mg 150 mL/hr over 60 Minutes Intravenous STAT 04/25/15 1715 04/25/15 1906   04/25/15 1645  aztreonam (AZACTAM) 2 g in dextrose 5 % 50 mL IVPB     2 g 100 mL/hr over 30 Minutes Intravenous  Once 04/25/15 1643 04/25/15 1807      Medications:  Scheduled: . apixaban  5 mg Oral BID  . aztreonam  1 g Intravenous Q8H  . baclofen  10 mg Oral BID WC  . baclofen  20 mg Oral QHS  . Chlorhexidine Gluconate Cloth  6 each Topical Q0600  . feeding supplement (GLUCERNA SHAKE)  237 mL Oral TID BM  . feeding supplement (PRO-STAT SUGAR FREE 64)  30 mL Oral TID BM  . fentaNYL  25 mcg Transdermal Q72H  . insulin aspart  0-9 Units Subcutaneous TID WC  . loratadine  10 mg Oral QHS  . midodrine  10 mg Oral TID WC  . mirtazapine  7.5 mg Oral QHS  . mupirocin ointment  1 application Nasal BID  . pregabalin  100 mg Oral TID AC  . pregabalin  200 mg Oral QHS  . sodium chloride  10-40 mL Intracatheter Q12H  . vancomycin  750 mg Intravenous Q24H     Objective: Vital signs in last 24 hours: Temp:  [97.8 F (36.6 C)-99.7 F (37.6 C)] 97.8 F (36.6 C) (07/21 0800) Resp:  [11-21] 15 (07/21 0700) BP: (79-148)/(17-102) 93/60 mmHg (07/21 0700) SpO2:  [100 %] 100 % (07/21 0700)   General appearance: no distress Resp: rhonchi bilaterally Cardio: regular rate and rhythm GI: normal findings: bowel sounds normal and soft, non-tender Extremities: L ankle/foot wrapped  Lab Results  Recent Labs  04/27/15 0815 04/28/15 0640  WBC 29.1* 31.1*  HGB 9.7* 9.3*  HCT 30.6* 28.9*  NA 137 136  K 3.4* 3.5  CL 117* 117*  CO2 17* 15*  BUN 14 11  CREATININE 0.40* 0.42*   Liver Panel  Recent Labs  04/27/15 0815 04/28/15 0640  PROT 6.0* 5.3*  ALBUMIN 1.2* 1.1*  AST 12* 8*  ALT 7* 7*  ALKPHOS 206* 173*  BILITOT 0.5 0.4   Sedimentation Rate No results for input(s): ESRSEDRATE in the last 72 hours. C-Reactive Protein No results for input(s): CRP in the last 72 hours.  Microbiology: Recent Results (from the past 240 hour(s))  Urine culture  Status: None   Collection Time: 04/25/15  4:35 PM  Result Value Ref Range Status   Specimen Description URINE, CLEAN CATCH  Final   Special Requests Normal  Final   Culture   Final    >=100,000 COLONIES/mL ESCHERICHIA COLI Performed at [facility]    Report Status 04/28/2015 FINAL  Final   Organism ID, Bacteria ESCHERICHIA COLI  Final      Susceptibility   Escherichia coli - MIC*    AMPICILLIN <=2 SENSITIVE Sensitive     CEFAZOLIN <=4 SENSITIVE Sensitive     CEFTRIAXONE <=1 SENSITIVE Sensitive     CIPROFLOXACIN >=4 RESISTANT Resistant     GENTAMICIN <=1 SENSITIVE Sensitive     IMIPENEM <=0.25 SENSITIVE Sensitive     NITROFURANTOIN <=16 SENSITIVE Sensitive     TRIMETH/SULFA <=20 SENSITIVE Sensitive     AMPICILLIN/SULBACTAM <=2 SENSITIVE Sensitive     PIP/TAZO <=4 SENSITIVE Sensitive     * >=100,000 COLONIES/mL ESCHERICHIA COLI  Blood culture (routine x 2)     Status:  None   Collection Time: 04/25/15  5:00 PM  Result Value Ref Range Status   Specimen Description BLOOD RIGHT ANTECUBITAL  Final   Special Requests BOTTLES DRAWN AEROBIC AND ANAEROBIC 5 CC EA  Final   Culture  Setup Time   Final    GRAM POSITIVE COCCI IN CLUSTERS Gram Stain Report Called to,Read Back By and Verified With: Elveria Rising RN 17:30 04/26/15 ([email or web address]) AEROBIC BOTTLE ONLY    Culture   Final    METHICILLIN RESISTANT STAPHYLOCOCCUS AUREUS Performed at [facility]    Report Status 04/28/2015 FINAL  Final   Organism ID, Bacteria METHICILLIN RESISTANT STAPHYLOCOCCUS AUREUS  Final      Susceptibility   Methicillin resistant staphylococcus aureus - MIC*    CIPROFLOXACIN >=8 RESISTANT Resistant     ERYTHROMYCIN >=8 RESISTANT Resistant     GENTAMICIN <=0.5 SENSITIVE Sensitive     OXACILLIN >=4 RESISTANT Resistant     TETRACYCLINE <=1 SENSITIVE Sensitive     VANCOMYCIN <=0.5 SENSITIVE Sensitive     TRIMETH/SULFA >=320 RESISTANT Resistant     CLINDAMYCIN >=8 RESISTANT Resistant     RIFAMPIN <=0.5 SENSITIVE Sensitive     Inducible Clindamycin NEGATIVE Sensitive     * METHICILLIN RESISTANT STAPHYLOCOCCUS AUREUS  Blood culture (routine x 2)     Status: None   Collection Time: 04/25/15  5:16 PM  Result Value Ref Range Status   Specimen Description BLOOD BLOOD RIGHT FOREARM  Final   Special Requests BOTTLES DRAWN AEROBIC AND ANAEROBIC 5 CC EA  Final   Culture  Setup Time   Final    GRAM POSITIVE COCCI IN CLUSTERS AEROBIC BOTTLE ONLY CRITICAL RESULT CALLED TO, READ BACK BY AND VERIFIED WITH: S GRAHAM 04/26/15 @ 9 M VESTAL    Culture   Final    STAPHYLOCOCCUS AUREUS SUSCEPTIBILITIES PERFORMED ON PREVIOUS CULTURE WITHIN THE LAST 5 DAYS. Performed at [facility]    Report Status 04/28/2015 FINAL  Final  MRSA PCR Screening     Status: Abnormal   Collection Time: 04/25/15  6:55 PM  Result Value Ref Range Status   MRSA by PCR POSITIVE (A) NEGATIVE Final    Comment:         The GeneXpert MRSA Assay (FDA approved for NASAL specimens only), is one component of a comprehensive MRSA colonization surveillance program. It is not intended to diagnose MRSA infection nor to guide or monitor treatment for MRSA infections.  RESULT CALLED TO, READ BACK BY AND VERIFIED WITH: Alston [id number] @ 2043 BY J SCOTTON     Studies/Results: No results found.   Assessment/Plan: MRSA bacteremia UTI- E coli Decubitus ulcers ? HCAP Thrombocytosis, leukocytosis Quadraplegia Protein calorie malnutrition, severe  Total days of antibiotics: 4 vanco/aztreonam  Wound care f/u MRI pending, also consider MRI of his L foot TEE pending Aim for 7-8 days of aztreonam (asked lab to check E coli for aztreonam).  Repeat BCx pending My great appreciation to [facility] for their care of Mr Duran Ohern Infectious Diseases (pager) [phone] [email or web address] 04/28/2015, 9:22 AM  LOS: 3 days

## 2015-04-28 NOTE — Progress Notes (Signed)
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 He was admitted for further evaluation of hypotension.   Assessment/Plan: 1. CXR findings of bibasilar infiltrate concerning for atypical pneumonia, without any symptomatology of pneumonia.  Started on on IV vancomycin and IV azactum. Blood cultures from 7/18 showed MRSA  Sensitive to vancomycin and tetracyclines.  Afebrile overnight, blood cultures could not be done as he was a difficult stick.  Appreciate infectious disease Dr Algis Downs input.     2. MRSA bacteremia:  On IV vancomycin.  Requested cardiology for TEE.   Sacral decubitus ulcer: Wound care following.  MRI of the sacrum, pelvis and left foot ordered as per recommendations of ID. Results shows chronic osteomyelitis and right internal obturator abscess extending fromt he right ischial tuberosity decubitus ulcer. No osteomyelitis of the left foot heel.    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  He resides at home and mother is the caregiver.    UTI ? Vs colonization: - awaiting urine cultures.   Type 1 DM: CBG (last 3)   Recent Labs  04/27/15 2027 04/28/15 1247 04/28/15 1600  GLUCAP 157* 103* 168*   cbg's are improving. His hemoglobin a1c is 8.3, plan to watch his cbg's for 24 hours before starting long acting insulin.     Code Status: FULL CODE.  Family Communication: mother at bedside Disposition Plan: pending further work up.    Consultants:  INFECTIOUS DISEASE.   Procedures:  MRI of the pelvis / sacrum  Antibiotics:  Vancomycin   Aztreonam.  HPI/Subjective: Back pain improved.   Objective: Filed Vitals:   04/28/15 1650  BP: 98/68  Pulse:   Temp:    Resp: 12    Intake/Output Summary (Last 24 hours) at 04/28/15 1700 Last data filed at 04/28/15 1400  Gross per 24 hour  Intake 5205.6 ml  Output   1300 ml  Net 3905.6 ml   Filed Weights   04/25/15 1711 04/25/15 1930  Weight: 49.896 kg (110 lb) 43.7 kg (96 lb 5.5 oz)    Exam:   General:  Lethargic,but answering questions appropriately  Cardiovascular: s1s2  Respiratory: diminished at bases, no wheezing or rhonchi  Abdomen: soft non tender non distended bowel sounds heard  Musculoskeletal: no edema , left foot ulcer on heel.   Data Reviewed: Basic Metabolic Panel:  Recent Labs Lab 04/25/15 1612 04/26/15 0400 04/27/15 0815 04/28/15 0640  NA 130* 134* 137 136  K 4.7 3.9 3.4* 3.5  CL 105 112* 117* 117*  CO2 20* 17* 17* 15*  GLUCOSE 116* 199* 77 155*  BUN 20 21* 14 11  CREATININE 0.68 0.52* 0.40* 0.42*  CALCIUM 10.2 8.8* 8.8* 8.2*  MG 1.5*  --   --   --    Liver Function Tests:  Recent Labs Lab 04/25/15 1612 04/26/15 0400 04/27/15 0815 04/28/15 0640  AST 12* 10* 12* 8*  ALT 7* 7* 7* 7*  ALKPHOS 160* 133* 206* 173*  BILITOT 0.3 0.6 0.5 0.4  PROT 7.3 6.0* 6.0* 5.3*  ALBUMIN 1.7* 1.3* 1.2* 1.1*   No results for input(s): LIPASE, AMYLASE in the last 168 hours. No results for input(s): AMMONIA in the last 168 hours. CBC:  Recent Labs Lab 04/25/15 1612 04/26/15 0400 04/27/15 0815 04/28/15 0640  WBC 30.1*  35.2* 29.1* 31.1*  HGB 7.0* 6.9* 9.7* 9.3*  HCT 22.8* 22.5* 30.6* 28.9*  MCV 79.7 79.8 79.9 79.0  PLT 732* 773* 755* 625*   Cardiac Enzymes: No results for input(s): CKTOTAL, CKMB, CKMBINDEX, TROPONINI in the last 168 hours. BNP (last 3 results) No results for input(s): BNP in the last 8760 hours.  ProBNP (last 3 results) No results for input(s): PROBNP in the last 8760 hours.  CBG:  Recent Labs Lab 04/27/15 1620 04/27/15 1829 04/27/15 2027 04/28/15 1247 04/28/15 1600  GLUCAP 136* 117* 157* 103* 168*    Recent Results (from the  past 240 hour(s))  Urine culture     Status: None   Collection Time: 04/25/15  4:35 PM  Result Value Ref Range Status   Specimen Description URINE, CLEAN CATCH  Final   Special Requests Normal  Final   Culture   Final    >=100,000 COLONIES/mL ESCHERICHIA COLI Performed at [facility]    Report Status 04/28/2015 FINAL  Final   Organism ID, Bacteria ESCHERICHIA COLI  Final      Susceptibility   Escherichia coli - MIC*    AMPICILLIN <=2 SENSITIVE Sensitive     CEFAZOLIN <=4 SENSITIVE Sensitive     CEFTRIAXONE <=1 SENSITIVE Sensitive     CIPROFLOXACIN >=4 RESISTANT Resistant     GENTAMICIN <=1 SENSITIVE Sensitive     IMIPENEM <=0.25 SENSITIVE Sensitive     NITROFURANTOIN <=16 SENSITIVE Sensitive     TRIMETH/SULFA <=20 SENSITIVE Sensitive     AMPICILLIN/SULBACTAM <=2 SENSITIVE Sensitive     PIP/TAZO <=4 SENSITIVE Sensitive     * >=100,000 COLONIES/mL ESCHERICHIA COLI  Blood culture (routine x 2)     Status: None   Collection Time: 04/25/15  5:00 PM  Result Value Ref Range Status   Specimen Description BLOOD RIGHT ANTECUBITAL  Final   Special Requests BOTTLES DRAWN AEROBIC AND ANAEROBIC 5 CC EA  Final   Culture  Setup Time   Final    GRAM POSITIVE COCCI IN CLUSTERS Gram Stain Report Called to,Read Back By and Verified With: Elveria Rising RN 17:30 04/26/15 ([email or web address]) AEROBIC BOTTLE ONLY    Culture   Final    METHICILLIN RESISTANT STAPHYLOCOCCUS AUREUS Performed at [facility]    Report Status 04/28/2015 FINAL  Final   Organism ID, Bacteria METHICILLIN RESISTANT STAPHYLOCOCCUS AUREUS  Final      Susceptibility   Methicillin resistant staphylococcus aureus - MIC*    CIPROFLOXACIN >=8 RESISTANT Resistant     ERYTHROMYCIN >=8 RESISTANT Resistant     GENTAMICIN <=0.5 SENSITIVE Sensitive     OXACILLIN >=4 RESISTANT Resistant     TETRACYCLINE <=1 SENSITIVE Sensitive     VANCOMYCIN <=0.5 SENSITIVE Sensitive     TRIMETH/SULFA >=320 RESISTANT Resistant     CLINDAMYCIN >=8  RESISTANT Resistant     RIFAMPIN <=0.5 SENSITIVE Sensitive     Inducible Clindamycin NEGATIVE Sensitive     * METHICILLIN RESISTANT STAPHYLOCOCCUS AUREUS  Blood culture (routine x 2)     Status: None   Collection Time: 04/25/15  5:16 PM  Result Value Ref Range Status   Specimen Description BLOOD BLOOD RIGHT FOREARM  Final   Special Requests BOTTLES DRAWN AEROBIC AND ANAEROBIC 5 CC EA  Final   Culture  Setup Time   Final    GRAM POSITIVE COCCI IN CLUSTERS AEROBIC BOTTLE ONLY CRITICAL RESULT CALLED TO, READ BACK BY AND VERIFIED WITH: S GRAHAM 04/26/15 @ [place]  Final    STAPHYLOCOCCUS AUREUS SUSCEPTIBILITIES PERFORMED ON PREVIOUS CULTURE WITHIN THE LAST 5 DAYS. Performed at [facility]    Report Status 04/28/2015 FINAL  Final  MRSA PCR Screening     Status: Abnormal   Collection Time: 04/25/15  6:55 PM  Result Value Ref Range Status   MRSA by PCR POSITIVE (A) NEGATIVE Final    Comment:        The GeneXpert MRSA Assay (FDA approved for NASAL specimens only), is one component of a comprehensive MRSA colonization surveillance program. It is not intended to diagnose MRSA infection nor to guide or monitor treatment for MRSA infections. RESULT CALLED TO, READ BACK BY AND VERIFIED WITH: Karlene Einstein [id number] @ 2043 BY J SCOTTON   Culture, blood (routine x 2)     Status: None (Preliminary result)   Collection Time: 04/28/15  1:20 PM  Result Value Ref Range Status   Specimen Description BLOOD LEFT ARM  Final   Special Requests   Final    AEROCOCCUS SPECIES 1CC Performed at [facility]    Culture PENDING  Incomplete   Report Status PENDING  Incomplete     Studies: Mr Pelvis Wo Contrast  04/28/2015   CLINICAL DATA:  Sacral decubitus ulcer.  Quadriplegia.  EXAM: MRI PELVIS WITHOUT CONTRAST  TECHNIQUE: Multiplanar multisequence MR imaging of the pelvis was performed. No intravenous contrast was administered.  COMPARISON:  MRIs dated 02/19/2015  and 01/08/2015  FINDINGS: There is a abscess involving the deep aspect of the right internal obturator muscle. The pus collection is approximately 2 cm in diameter and contains air. The abscess is immediately medial to the roof of the right acetabulum.  The deep decubitus ulcers overlying the ischial tuberosities extend more deeply than on the prior study. There has a either been debridement or destruction of a portion of the right inferior pubic ramus and right ischial tuberosities since the prior study. There is persistent abnormal signal in the remnants of the right inferior pubic ramus extending to the right acetabulum consistent with chronic osteomyelitis.  The left decubitus ulcer now extends directly to the surface of the left ischial tuberosity but there is no bone destruction to suggest osteomyelitis.  Since the prior study the central portion of the fourth sacral segment has been resected or destroyed. No visible osteomyelitis of the sacral remnants.  Suprapubic catheter noted in the bladder. Diffuse thickening of the bladder wall.  IMPRESSION: 1. Right internal obturator abscess, appearing to extend from the right ischial tuberosity decubitus ulcer. 2. Persistent abnormal signal from the right inferior pubic ramus remnants extending into the posterior aspect of the right acetabulum. 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   Electronically Signed   By: Lorriane Shire M.D.   On: 04/28/2015 12:34   Mr Heel Left Wo Contrast  04/28/2015   CLINICAL DATA:  Heel pain.  Type 1 diabetes.  EXAM: MR OF THE LEFT HEEL WITHOUT CONTRAST  TECHNIQUE: Multiplanar, multisequence MR imaging was performed. No intravenous contrast was administered.  COMPARISON:  01/11/2015  FINDINGS: Peroneal: Peroneal longus tendon intact. Peroneal brevis intact.  Posteromedial: Posterior tibial tendon intact. Flexor hallucis longus tendon intact. Flexor digitorum longus tendon intact.  Anterior: Tibialis anterior tendon intact. Extensor  hallucis longus tendon intact Extensor digitorum longus tendon intact.  Achilles: Intact.  Plantar Fascia: Intact.  LIGAMENTS  Medial: Deltoid ligament intact. Spring ligament intact.  Lateral: Anterior talofibular ligament intact. Posterior talofibular ligament intact. Anterior and posterior tibiofibular ligaments intact.  Cartilage: General chondral thinning of the tibiotalar joint.  Ankle Joint: No joint effusion. No dislocation.  Subtalar Joint: No joint effusion. Normal subtalar joints.  Sinus Tarsi: Normal.  Bones: No marrow signal abnormality. No bone destruction or periosteal reaction.  Soft tissue: Small soft tissue ulcer over the posterior calcaneus with adjacent soft tissue edema within the subcutaneous fat. No drainable fluid collection or hematoma. Increased T2 signal throughout the plantar musculature likely neurogenic.  IMPRESSION: 1. No osteomyelitis of the left ankle. Soft tissue ulcer and surrounding edema overlying plantar aspect of the posterior calcaneus without an abscess.   Electronically Signed   By: Kathreen Devoid   On: 04/28/2015 11:41    Scheduled Meds: . apixaban  5 mg Oral BID  . aztreonam  1 g Intravenous Q8H  . baclofen  10 mg Oral BID WC  . baclofen  20 mg Oral QHS  . Chlorhexidine Gluconate Cloth  6 each Topical Q0600  . feeding supplement (GLUCERNA SHAKE)  237 mL Oral TID BM  . feeding supplement (PRO-STAT SUGAR FREE 64)  30 mL Oral TID BM  . fentaNYL  25 mcg Transdermal Q72H  . insulin aspart  0-9 Units Subcutaneous TID WC  . loratadine  10 mg Oral QHS  . midodrine  10 mg Oral TID WC  . mirtazapine  7.5 mg Oral QHS  . mupirocin ointment  1 application Nasal BID  . pregabalin  100 mg Oral TID AC  . pregabalin  200 mg Oral QHS  . sodium chloride  10-40 mL Intracatheter Q12H  . vancomycin  750 mg Intravenous Q24H   Continuous Infusions: . sodium chloride 500 mL (04/28/15 1533)  . dextrose 50 mL/hr at 04/28/15 1400  . norepinephrine (LEVOPHED) Adult infusion  Stopped (04/28/15 0940)    Active Problems:   Type 1 diabetes, uncontrolled, with neuropathy   Spinal cord infarction (history of)   Sepsis   Stage IV decubitus ulcer   Urinary tract infectious disease   HCAP (healthcare-associated pneumonia)    Time spent:35 min    Sheryle Vice  [facility] Pager [phone]  If 7PM-7AM, please contact night-coverage at [email or web address], password [facility] 04/28/2015, 5:00 PM  LOS: 3 days

## 2015-04-28 NOTE — Progress Notes (Signed)
Inpatient Diabetes Program Recommendations  AACE/ADA: New Consensus Statement on Inpatient Glycemic Control (2013)  Target Ranges:  Prepandial:   less than 140 mg/dL      Peak postprandial:   less than 180 mg/dL (1-2 hours)      Critically ill patients:  140 - 180 mg/dL   Reason for Visit: Hypoglycemia   Results for KALEP, FULL (MRN [id number]) as of 04/28/2015 11:16  Ref. Range 04/27/2015 07:39 04/27/2015 07:45 04/27/2015 08:07 04/27/2015 11:56 04/27/2015 12:30 04/27/2015 16:20 04/27/2015 18:29 04/27/2015 20:27  Glucose-Capillary Latest Ref Range: 65-99 mg/dL 39 (LL) <10 (LL) 90 58 (L) 122 (H) 136 (H) 117 (H) 157 (H)   Pt was on Lantus 8 units QHS and had hypoglycemia in am. Will likely need some basal insulin since Type 1.  Recommendations: Restart Lantus at 5 units Q24H. Reassess glycemic control daily. Will continue to follow.  Thank you. Lorenda Peck, RD, LDN, CDE Inpatient Diabetes Coordinator [phone]

## 2015-04-28 NOTE — Progress Notes (Signed)
ANTIBIOTIC CONSULT NOTE - Follow Up  Pharmacy Consult for Vancomycin, Aztreonam Indication: pneumonia  Allergies  Allergen Reactions  . Cefuroxime Axetil Anaphylaxis  . Ertapenem Other (See Comments)    Rash and confusion  . Morphine And Related Other (See Comments)    Changed mental status, confusion, headache, visual hallucination  . Penicillins Anaphylaxis and Other (See Comments)    ?can take amoxicillin?  . Sulfa Antibiotics Anaphylaxis, Shortness Of Breath and Other (See Comments)  . Tessalon [Benzonatate] Anaphylaxis  . Shellfish Allergy Itching and Other (See Comments)    Took benadryl to alleviate reaction  . Miripirium Rash and Other (See Comments)    Change in mental status    Patient Measurements: Height: 5\' 8"  (172.7 cm) Weight: 96 lb 5.5 oz (43.7 kg) IBW/kg (Calculated) : 68.4  Vital Signs: Temp: 99.7 F (37.6 C) (07/21 0400) Temp Source: Oral (07/21 0400) BP: 93/60 mmHg (07/21 0700) Intake/Output from previous day: 07/20 0701 - 07/21 0700 In: 6443.8 [P.O.:857; I.V.:4986.8; IV IFOYDXAJO:878] Out: 1805 [Urine:500; Drains:250; MVEHM:0947] Intake/Output from this shift:    Labs:  Recent Labs  04/26/15 0400 04/27/15 0815 04/28/15 0640  WBC 35.2* 29.1* 31.1*  HGB 6.9* 9.7* 9.3*  PLT 773* 755* 625*  CREATININE 0.52* 0.40* 0.42*   Estimated Creatinine Clearance: 83.5 mL/min (by C-G formula based on Cr of 0.42).  Recent Labs  04/28/15 0640  [place] 25*     Microbiology: Recent Results (from the past 720 hour(s))  Urine culture     Status: None   Collection Time: 03/30/15  8:05 PM  Result Value Ref Range Status   Specimen Description URINE, CLEAN CATCH  Final   Special Requests NONE  Final   Culture   Final    >=100,000 COLONIES/mL ESCHERICHIA COLI Performed at [facility]    Report Status 04/01/2015 FINAL  Final   Organism ID, Bacteria ESCHERICHIA COLI  Final      Susceptibility   Escherichia coli - MIC*    AMPICILLIN <=2  SENSITIVE Sensitive     CEFAZOLIN <=4 SENSITIVE Sensitive     CEFTRIAXONE <=1 SENSITIVE Sensitive     CIPROFLOXACIN >=4 RESISTANT Resistant     GENTAMICIN <=1 SENSITIVE Sensitive     IMIPENEM <=0.25 SENSITIVE Sensitive     NITROFURANTOIN <=16 SENSITIVE Sensitive     TRIMETH/SULFA <=20 SENSITIVE Sensitive     AMPICILLIN/SULBACTAM <=2 SENSITIVE Sensitive     PIP/TAZO <=4 SENSITIVE Sensitive     * >=100,000 COLONIES/mL ESCHERICHIA COLI  Culture, blood (routine x 2)     Status: None   Collection Time: 03/30/15  8:23 PM  Result Value Ref Range Status   Specimen Description BLOOD LAC  Final   Special Requests BOTTLES DRAWN AEROBIC AND ANAEROBIC 5CC  Final   Culture  Setup Time   Final    GRAM POSITIVE COCCI IN CLUSTERS AEROBIC BOTTLE ONLY CALLED TO ASHUE(MLT) BY TC[place]AND 04/01/15 AT 7:41AM CRITICAL RESULT CALLED TO, READ BACK AND VERIFIED WITH J. HAMILTON RN AT 0745 ON 06.24.16 BY SHUEA CONFIRMED BY KBARR,MT    Culture   Final    METHICILLIN RESISTANT STAPHYLOCOCCUS AUREUS Performed at [facility]    Report Status 04/05/2015 FINAL  Final   Organism ID, Bacteria METHICILLIN RESISTANT STAPHYLOCOCCUS AUREUS  Final      Susceptibility   Methicillin resistant staphylococcus aureus - MIC*    CIPROFLOXACIN >=8 RESISTANT Resistant     ERYTHROMYCIN >=8 RESISTANT Resistant     GENTAMICIN <=0.5 SENSITIVE Sensitive  OXACILLIN >=4 RESISTANT Resistant     TETRACYCLINE <=1 SENSITIVE Sensitive     VANCOMYCIN 1 SENSITIVE Sensitive     TRIMETH/SULFA >=320 RESISTANT Resistant     CLINDAMYCIN >=8 RESISTANT Resistant     RIFAMPIN <=0.5 SENSITIVE Sensitive     Inducible Clindamycin NEGATIVE Sensitive     * METHICILLIN RESISTANT STAPHYLOCOCCUS AUREUS  Culture, blood (routine x 2)     Status: None   Collection Time: 03/30/15  8:31 PM  Result Value Ref Range Status   Specimen Description BLOOD RPICC  Final   Special Requests BOTTLES DRAWN AEROBIC AND ANAEROBIC 5CC  Final   Culture   Final     NO GROWTH 5 DAYS Performed at [facility]    Report Status 04/04/2015 FINAL  Final  Urine culture     Status: None (Preliminary result)   Collection Time: 04/25/15  4:35 PM  Result Value Ref Range Status   Specimen Description URINE, CLEAN CATCH  Final   Special Requests Normal  Final   Culture   Final    >=100,000 COLONIES/mL ESCHERICHIA COLI Performed at [facility]    Report Status PENDING  Incomplete  Blood culture (routine x 2)     Status: None (Preliminary result)   Collection Time: 04/25/15  5:00 PM  Result Value Ref Range Status   Specimen Description BLOOD RIGHT ANTECUBITAL  Final   Special Requests BOTTLES DRAWN AEROBIC AND ANAEROBIC 5 CC EA  Final   Culture  Setup Time   Final    GRAM POSITIVE COCCI IN CLUSTERS Gram Stain Report Called to,Read Back By and Verified With: Elveria Rising RN 17:30 04/26/15 ([email or web address]) AEROBIC BOTTLE ONLY    Culture   Final    STAPHYLOCOCCUS AUREUS Performed at [facility]    Report Status PENDING  Incomplete  Blood culture (routine x 2)     Status: None (Preliminary result)   Collection Time: 04/25/15  5:16 PM  Result Value Ref Range Status   Specimen Description BLOOD BLOOD RIGHT FOREARM  Final   Special Requests BOTTLES DRAWN AEROBIC AND ANAEROBIC 5 CC EA  Final   Culture  Setup Time   Final    GRAM POSITIVE COCCI IN CLUSTERS AEROBIC BOTTLE ONLY CRITICAL RESULT CALLED TO, READ BACK BY AND VERIFIED WITH: S GRAHAM 04/26/15 @ 56 M VESTAL    Culture   Final    STAPHYLOCOCCUS AUREUS Performed at [facility]    Report Status PENDING  Incomplete  MRSA PCR Screening     Status: Abnormal   Collection Time: 04/25/15  6:55 PM  Result Value Ref Range Status   MRSA by PCR POSITIVE (A) NEGATIVE Final    Comment:        The GeneXpert MRSA Assay (FDA approved for NASAL specimens only), is one component of a comprehensive MRSA colonization surveillance program. It is not intended to diagnose MRSA infection  nor to guide or monitor treatment for MRSA infections. RESULT CALLED TO, READ BACK BY AND VERIFIED WITH: Karlene Einstein [id number] @ 2043 BY J SCOTTON     Medications:  Scheduled:  . apixaban  5 mg Oral BID  . aztreonam  1 g Intravenous Q8H  . baclofen  10 mg Oral BID WC  . baclofen  20 mg Oral QHS  . Chlorhexidine Gluconate Cloth  6 each Topical Q0600  . feeding supplement (GLUCERNA SHAKE)  237 mL Oral TID BM  . feeding supplement (PRO-STAT SUGAR FREE 64)  30 mL  Oral TID BM  . fentaNYL  25 mcg Transdermal Q72H  . insulin aspart  0-9 Units Subcutaneous TID WC  . loratadine  10 mg Oral QHS  . midodrine  10 mg Oral TID WC  . mirtazapine  7.5 mg Oral QHS  . mupirocin ointment  1 application Nasal BID  . pregabalin  100 mg Oral TID AC  . pregabalin  200 mg Oral QHS  . sodium chloride  10-40 mL Intracatheter Q12H  . vancomycin  750 mg Intravenous Q24H   Infusions:  . sodium chloride 150 mL/hr (04/28/15 0209)  . dextrose 50 mL/hr (04/28/15 0620)  . norepinephrine (LEVOPHED) Adult infusion 1 mcg/min (04/28/15 0000)   PRN:   Assessment: 29 yo male with history of a C6 spinal cord injury, wheelchair bound, with multiple medical problems with frequent visits to the ED. He presents from the wound care clinic with hypotension (BP 60/47). CXR shows bibasilar interstitial infiltrate - suspect atypical pneumonia.  EDP has ordered aztreonam x 1 dose and Pharmacy is consulted to dose vancomycin.  Today, 04/28/2015 Day 4 antibiotics Tmax: spite 101 7/19 PM, 99.7 currently WBC remain elevated 31.1k SCr 0.42 with Crcl ~ 83 (>100 N, but note LOW weight with severe muscle wasting) Lactic acid: 1.23 (7/18)  7/18 >> Vancomycin >> 7/18 >> Aztreonam >>  7/18 blood: 2 of 2 Staph aureus (sens pending) - ID consulting 7/18 urine: >100k E.coli (sens pending) 7/18 MRSA: positive  Dose changes/levels: 7/21 VT at 07:00 = 25 before 6th dose of 750mg  q12h  Goal of Therapy:  Vancomycin trough  level 15-20 mcg/ml  Aztreonam dose per renal function  Plan:   Decrease Vancomycin 750mg  IV q24h  Continue Aztreonam 1g q8h - ID recommends limiting to 8 days Follow up renal function & cultures, TEE, MRI sacrum  Peggyann Juba, PharmD, BCPS Pager: [phone] 04/28/2015,7:31 AM

## 2015-04-29 ENCOUNTER — Encounter (HOSPITAL_COMMUNITY)
Admission: EM | Disposition: A | Discharge status: Home Health | Payer: Self-pay | Source: Home / Self Care | Attending: Internal Medicine

## 2015-04-29 ENCOUNTER — Encounter (HOSPITAL_COMMUNITY): Payer: Self-pay | Admitting: *Deleted

## 2015-04-29 DIAGNOSIS — L02415 Cutaneous abscess of right lower limb: Secondary | ICD-10-CM

## 2015-04-29 DIAGNOSIS — X58XXXA Exposure to other specified factors, initial encounter: Secondary | ICD-10-CM

## 2015-04-29 DIAGNOSIS — S91302A Unspecified open wound, left foot, initial encounter: Secondary | ICD-10-CM

## 2015-04-29 DIAGNOSIS — I959 Hypotension, unspecified: Secondary | ICD-10-CM | POA: Insufficient documentation

## 2015-04-29 LAB — GLUCOSE, CAPILLARY
GLUCOSE-CAPILLARY: 148 mg/dL — AB (ref 65–99)
GLUCOSE-CAPILLARY: 262 mg/dL — AB (ref 65–99)
Glucose-Capillary: 292 mg/dL — ABNORMAL HIGH (ref 65–99)
Glucose-Capillary: 337 mg/dL — ABNORMAL HIGH (ref 65–99)
Glucose-Capillary: 120 mg/dL — ABNORMAL HIGH (ref 65–99)
Glucose-Capillary: 117 mg/dL — ABNORMAL HIGH (ref 65–99)

## 2015-04-29 LAB — CBC WITH DIFFERENTIAL/PLATELET
Basophils Absolute: 0 10*3/uL (ref 0.0–0.1)
Basophils Relative: 0 % (ref 0–1)
Eosinophils Absolute: 0.3 10*3/uL (ref 0.0–0.7)
Eosinophils Relative: 1 % (ref 0–5)
HEMATOCRIT: 32.3 % — AB (ref 39.0–52.0)
HEMOGLOBIN: 10.2 g/dL — AB (ref 13.0–17.0)
LYMPHS PCT: 7 % — AB (ref 12–46)
Lymphs Abs: 2.4 10*3/uL (ref 0.7–4.0)
MCH: 25.3 pg — ABNORMAL LOW (ref 26.0–34.0)
MCHC: 31.6 g/dL (ref 30.0–36.0)
MCV: 80.1 fL (ref 78.0–100.0)
MONO ABS: 2.4 10*3/uL — AB (ref 0.1–1.0)
Monocytes Relative: 7 % (ref 3–12)
NEUTROS ABS: 29.5 10*3/uL — AB (ref 1.7–7.7)
NEUTROS PCT: 85 % — AB (ref 43–77)
Platelets: 726 10*3/uL — ABNORMAL HIGH (ref 150–400)
RBC: 4.03 MIL/uL — AB (ref 4.22–5.81)
RDW: 16.1 % — AB (ref 11.5–15.5)
WBC: 34.6 10*3/uL — AB (ref 4.0–10.5)

## 2015-04-29 LAB — BASIC METABOLIC PANEL
Anion gap: 4 — ABNORMAL LOW (ref 5–15)
BUN: 13 mg/dL (ref 6–20)
CHLORIDE: 116 mmol/L — AB (ref 101–111)
CO2: 14 mmol/L — AB (ref 22–32)
Calcium: 7.8 mg/dL — ABNORMAL LOW (ref 8.9–10.3)
Creatinine, Ser: 0.47 mg/dL — ABNORMAL LOW (ref 0.61–1.24)
GFR calc non Af Amer: >60 mL/min (ref >60)
GLUCOSE: 394 mg/dL — AB (ref 65–99)
Potassium: 3.8 mmol/L (ref 3.5–5.1)
Sodium: 134 mmol/L — ABNORMAL LOW (ref 135–145)

## 2015-04-29 SURGERY — CANCELLED PROCEDURE

## 2015-04-29 MED ORDER — INSULIN ASPART 100 UNIT/ML ~~LOC~~ SOLN
0.0000 [IU] | Freq: Three times a day (TID) | SUBCUTANEOUS | Status: DC
  Administered 2015-04-29: 8 [IU] via SUBCUTANEOUS
  Administered 2015-04-30 – 2015-05-01 (×4): 3 [IU] via SUBCUTANEOUS
  Administered 2015-05-01: 2 [IU] via SUBCUTANEOUS
  Administered 2015-05-02 (×2): 5 [IU] via SUBCUTANEOUS
  Administered 2015-05-02 – 2015-05-03 (×2): 8 [IU] via SUBCUTANEOUS
  Administered 2015-05-03: 5 [IU] via SUBCUTANEOUS
  Administered 2015-05-03 – 2015-05-06 (×5): 3 [IU] via SUBCUTANEOUS
  Administered 2015-05-08 (×2): 2 [IU] via SUBCUTANEOUS

## 2015-04-29 MED ORDER — SODIUM CHLORIDE 0.9 % IV SOLN
INTRAVENOUS | Status: DC
  Administered 2015-05-05: 500 mL via INTRAVENOUS

## 2015-04-29 MED ORDER — INSULIN GLARGINE 100 UNIT/ML ~~LOC~~ SOLN
5.0000 [IU] | Freq: Every day | SUBCUTANEOUS | Status: DC
  Administered 2015-04-29 – 2015-05-03 (×5): 5 [IU] via SUBCUTANEOUS
  Filled 2015-04-29 (×5): qty 0.05

## 2015-04-29 MED ORDER — HYDROMORPHONE HCL 1 MG/ML IJ SOLN
1.0000 mg | INTRAMUSCULAR | Status: DC | PRN
Start: 2015-04-29 — End: 2015-05-02
  Administered 2015-04-29 – 2015-05-02 (×11): 1 mg via INTRAVENOUS
  Filled 2015-04-29 (×13): qty 1

## 2015-04-29 NOTE — Progress Notes (Signed)
Patient arrived via [facility] from [facility] ICU. Pt is presently on Levaphed of which Dr. Radford Pax was unaware. TEE is cancelled and pt will return to ICU bed at [facility].

## 2015-04-29 NOTE — Progress Notes (Signed)
Date:  April 29, 2015 U.R. performed for needs and level of care. Will continue to follow for Case Management needs.  Rhonda Davis, RN, BSN, CCM   [phone] 

## 2015-04-29 NOTE — Progress Notes (Addendum)
    [facility] has been requested to perform a transesophageal echocardiogram on 04/29/2015 for MRSA bacteremia.  After careful review of history and examination, the risks and benefits of transesophageal echocardiogram have been explained including risks of esophageal damage, perforation (1:10,000 risk), bleeding, pharyngeal hematoma as well as other potential complications associated with conscious sedation including aspiration, arrhythmia, respiratory failure and death. Alternatives to treatment were discussed, questions were answered. Patient is willing to proceed.   I have explained to both the patient and his mother who was at bedside. Patient last ate around 2AM, he has h/o gastroparesis. Will check with Dr. Radford Pax before placing him on for TEE today. Discussed with Dr. Radford Pax, will allow 12 hr period and arrange TEE at 2pm today given his h/o gastroparesis    Almyra [name], PA-C 04/29/2015 11:10 AM

## 2015-04-29 NOTE — Progress Notes (Signed)
Nutrition Follow-up  DOCUMENTATION CODES:   Severe malnutrition in context of acute illness/injury, Underweight  INTERVENTION:  - Continue Glucerna Shake TID - Continue Prostat TID - RD will continue to monitor for needs  NUTRITION DIAGNOSIS:   Increased nutrient needs related to wound healing as evidenced by estimated needs. -ongoing  GOAL:   Patient will meet greater than or equal to 90% of their needs, Weight gain -unmet at this time  MONITOR:   PO intake, Supplement acceptance, Weight trends, Labs  ASSESSMENT:  29 year old male who has a past medical history of GERD (gastroesophageal reflux disease); Asthma; MRSA infection; Gastroparesis; Diabetic neuropathy; Seizures; Family history of anesthesia complication; Dysrhythmia; Pneumonia; Arthritis; Fibromyalgia; Stroke (01/29/2014); Type I diabetes mellitus; and Syncope (02/16/2015). 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 Patient was sent to the emergency room from the physical therapy office today after patient was found to be hypotensive. [name] says that he has not been eating and drinking well for past two weeks. In the ED patient was found to be extremely dehydrated, had white count of 20,000, chest x-ray showing pneumonia as well as abnormal UA consistent with UTI.  7/22 - Pt consumed 10% of dinner 7/19 and 100% breakfast, 50% lunch, and 20% dinner 7/20 - Mother reports that yesterday pt ate more than he has recently and feels he is on the "upswing" as it relates to PO intakes - She states she and pt stayed up late last night watching movies and pt snacked often during this time - Continued discussion with mother concerning ways to incorporate more kcal and protein into food items that pt likes - Mother upset and tearful related to comments made by health professionals PTA; encouraged adequate PO intake and not to focus on what may be a "healthier" option as weight gain is the major  concern at this time - Pt seen by DM Coordinator yesterday AM  - Not meeting needs consistently - Medications reviewed. Labs reviewed; CBGs: 103-292 mg/dL, Na: 134 mmol/L, Cl: 116 mmol/L, creatinine low, Ca: 7.8 mg/dL   7/19 - Mother provides all information. She indicates that today she was told that pt has sores in his mouth making intakes painful and that he should avoid acidic items, carbonation (thrush?) - PTA, he had a poor appetite for 2-3 days that continued to decrease to the point of only eating 1 bite/meal - Mother reports that pt lost 25 lbs in the past month and she is concerned. She asks for suggestions to increase protein and kcal - She states that pt is a picky eater overall. Pt was drinking, on average, 2 cans of Glucerna/day at home - Per weight hx review, pt has lost 34 lbs (26% body weight) in the past month. Severe muscle and fat wasting noted during physical assessment  Diet Order:  Diet Carb Modified Fluid consistency:: Thin; Room service appropriate?: Yes  Skin:  Wound (see comment) (Stage 4 sacral pressure ulcer, Stage 3 L heel pressure ulcer, Incision to back)  Last BM:  7/21  Height:   Ht Readings from Last 1 Encounters:  04/25/15 5\' 8"  (1.727 m)    Weight:   Wt Readings from Last 1 Encounters:  04/29/15 130 lb 15.3 oz (59.4 kg)    Ideal Body Weight:  70 kg (kg)  Wt Readings from Last 10 Encounters:  04/29/15 130 lb 15.3 oz (59.4 kg)  04/01/15 130 lb (58.968 kg)  02/20/15 111 lb 15.9 oz (50.8 kg)  02/16/15  112 lb (50.803 kg)  01/12/15 128 lb 4.9 oz (58.2 kg)  12/19/14 130 lb 1.1 oz (59 kg)  11/25/14 155 lb (70.308 kg)  11/19/14 155 lb 13.8 oz (70.7 kg)  10/02/14 125 lb 10.6 oz (57 kg)  09/23/14 131 lb 1.6 oz (59.467 kg)    BMI:  Body mass index is 19.92 kg/(m^2).  Estimated Nutritional Needs:   Kcal:  3300-7622  Protein:  85-100 grams  Fluid:  2.5 L/day  EDUCATION NEEDS:   No education needs identified at this time     Jarome Matin, RD, LDN Inpatient Clinical Dietitian Pager # [phone] After hours/weekend pager # [phone]

## 2015-04-29 NOTE — Consult Note (Signed)
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 Full thickness tissue loss to mid back.  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  Measurement:not assessed today.  See noted from 04/27/15. Wound AVW:UJWJX red with 25% adherent slough in each wound bed.  Drainage (amount, consistency, odor) Bilateral ischium and sacral wound bleed heavily with dressing change today.  Periwound:Some MARSI noted to periwound.  Mother prefers all 3 wounds be bridged together rather than a Y connector as was used on Wednesday.  Wounds bridged to left trochanter.  Skin prep used to promote seal.  Dressing procedure/placement/frequency:VAC change Mon-Wed-Fri as ordered.  Vaseline gauze to mid back lesions.    Mother is out of collagen dressing and we do not carry that on formulary.  We will begin Aquacel Ag Mon-WEd-Fri to left foot.  [place] team will continue to follow.  Domenic Moras RN BSN [place] Pager [phone]

## 2015-04-29 NOTE — Consult Note (Signed)
Reason for Consult:  abscess Referring Physician: Dr. Venia Minks  Jason [name] is an 29 y.o. male.  HPI: 29 y/o with C6 spinal cord injury wheelchair bound admitted on 04/25/15 with severe dehydration,not eating or drinking well for 2 weeks,  Hypotensive, WBC 20K,pneumonia on CXR.  He has an extensive history of sacral and right ischial decubitus and has had multiple procedures by Dr. Migdalia Dk, 6 this year,  a diverting colostomy, 10/12/14 by Dr. Georgette Dover.  Wound care nursing note reports:  "Sacrum 12 cm x 14 cm x 4 cm per flowsheet. Mother indicates that [facility] dressing change was performed yesterday to sacrum and bilateral ischium and bridged to hip, and she would like all dressings to stay on Mon-Wed-Fri schedule. Left foot dressing is changed and assessed today. VAC dressing intact and no leak present. Will change Wednesday."   Despite current care and 5 days of antibiotics WBC continues to rise.  BP remains low, glucose is poorly controlled, he has positive urine with E coli and MRSA blood cultures, followed now by ID.   MRI shows right internal obturator abscess.  The pus collection is approximately 2 cm in diameter and contains air. The abscess is immediately medial to the roof of the right acetabulum.  Right internal obturator abscess, appearing to extend from the right ischial tuberosity decubitus ulcer.  Persistent abnormal signal from the right inferior pubic ramus remnants extending into the posterior aspect of the right acetabulum. 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 Dr. Karleen Hampshire has not been able to contact Dr. Migdalia Dk and we are ask to see.    Past Medical History  Diagnosis Date  . GERD (gastroesophageal reflux disease)   . Asthma   . Hx MRSA infection     on face  . Gastroparesis   . Diabetic neuropathy   . Seizures   . Family history of anesthesia complication     Pt mother can't have epidural procedures  . Dysrhythmia   . Pneumonia   . Arthritis   . Fibromyalgia   . Stroke  01/29/2014    spinal stroke ; "quadriplegic since"  . Type I diabetes mellitus     sees Dr. Loanne Drilling   . Syncope 02/16/2015    Past Surgical History  Procedure Laterality Date  . Tonsillectomy    . Multiple extractions with alveoloplasty N/A 08/03/2014    Procedure: MULTIPLE EXTRACTIONS;  Surgeon: Gae Bon, DDS;  Location: [place];  Service: Oral Surgery;  Laterality: N/A;  . Tee without cardioversion N/A 08/17/2014    Procedure: TRANSESOPHAGEAL ECHOCARDIOGRAM (TEE);  Surgeon: Dorothy Spark, MD;  Location: [facility] ENDOSCOPY;  Service: Cardiovascular;  Laterality: N/A;  . Debridment of decubitus ulcer N/A 10/04/2014    Procedure: DEBRIDMENT OF DECUBITUS ULCER;  Surgeon: Georganna Skeans, MD;  Location: [place];  Service: General;  Laterality: N/A;  . Laparoscopic diverted colostomy N/A 10/12/2014    Procedure: LAPAROSCOPIC DIVERTING COLOSTOMY;  Surgeon: Donnie Mesa, MD;  Location: [place];  Service: General;  Laterality: N/A;  . Insertion of suprapubic catheter N/A 10/12/2014    Procedure: INSERTION OF SUPRAPUBIC CATHETER;  Surgeon: Reece Packer, MD;  Location: [place];  Service: Urology;  Laterality: N/A;  . Incision and drainage of wound N/A 11/12/2014    Procedure: IRRIGATION AND DEBRIDEMENT OF WOUNDS WITH BONE BIOPSY AND SURGICAL PREP ;  Surgeon: Theodoro Kos, DO;  Location: [place];  Service: Plastics;  Laterality: N/A;  . Application of a-cell of back N/A 11/12/2014  Procedure: APPLICATION A CELL AND VAC ;  Surgeon: Theodoro Kos, DO;  Location: [place];  Service: Plastics;  Laterality: N/A;  . Incision and drainage of wound N/A 11/18/2014    Procedure: IRRIGATION AND DEBRIDEMENT OF SACRAL ULCER ONLY WITH PLACEMENT OF A CELL AND VAC/ DRESSING CHANGE TO UPPER BACK AREA.;  Surgeon: Theodoro Kos, DO;  Location: [place];  Service: Plastics;  Laterality: N/A;  . Incision and drainage of wound N/A 11/25/2014    Procedure: IRRIGATION AND DEBRIDEMENT OF SACRAL ULCER AND BACK BURN WITH PLACEMENT OF  A-CELL;  Surgeon: Theodoro Kos, DO;  Location: [place];  Service: Plastics;  Laterality: N/A;  . Minor application of wound vac N/A 11/25/2014    Procedure:  WOUND VAC CHANGE;  Surgeon: Theodoro Kos, DO;  Location: [place];  Service: Plastics;  Laterality: N/A;  . Incision and drainage of wound Right 12/08/2014    Procedure: IRRIGATION AND DEBRIDEMENT SACRAL WOUND AND RIGHT ISCHIAL WOUND ;  Surgeon: Theodoro Kos, DO;  Location: [place];  Service: Plastics;  Laterality: Right;  . Application of a-cell of back N/A 12/08/2014    Procedure: APPLICATION OF A-CELL AND WOUND VAC ;  Surgeon: Theodoro Kos, DO;  Location: [place];  Service: Plastics;  Laterality: N/A;    Family History  Problem Relation Age of Onset  . Diabetes Father   . Hypertension Father   . Asthma      fhx  . Hypertension      fhx  . Stroke      fhx  . Heart disease Mother     Social History:  reports that he quit smoking about 7 months ago. His smoking use included Cigars and Cigarettes. He has a 12 pack-year smoking history. He has never used smokeless tobacco. He reports that he does not drink alcohol or use illicit drugs.  Allergies:  Allergies  Allergen Reactions  . Cefuroxime Axetil Anaphylaxis  . Ertapenem Other (See Comments)    Rash and confusion  . Morphine And Related Other (See Comments)    Changed mental status, confusion, headache, visual hallucination  . Penicillins Anaphylaxis and Other (See Comments)    ?can take amoxicillin?  . Sulfa Antibiotics Anaphylaxis, Shortness Of Breath and Other (See Comments)  . Tessalon [Benzonatate] Anaphylaxis  . Shellfish Allergy Itching and Other (See Comments)    Took benadryl to alleviate reaction  . Miripirium Rash and Other (See Comments)    Change in mental status    Medications:  Prior to Admission:  Prescriptions prior to admission  Medication Sig Dispense Refill Last Dose  . acetaminophen (TYLENOL) 325 MG tablet Take 2 tablets (650 mg total) by mouth every 6  (six) hours as needed for mild pain, moderate pain, fever or headache.   04/22/2015  . albuterol (PROVENTIL) (2.5 MG/3ML) 0.083% nebulizer solution Take 3 mLs (2.5 mg total) by nebulization every 4 (four) hours as needed for wheezing or shortness of breath. 75 mL 3 unknown  . apixaban (ELIQUIS) 5 MG TABS tablet Take 1 tablet (5 mg total) by mouth 2 (two) times daily. 180 tablet 3 04/25/2015 at 0800  . B Complex-Folic Acid (B COMPLEX-VITAMIN B12 PO) Take 1 tablet by mouth at bedtime.   04/24/2015 at Unknown time  . baclofen (LIORESAL) 10 MG tablet Take 1-2 tablets (10-20 mg total) by mouth 3 (three) times daily. Takes 22m in morning and lunchtime and takes 21mat bedtime 30 each 0 04/25/2015 at Unknown time  . collagenase (SANTYL) ointment Apply  1 application topically daily. 90 g 5 04/25/2015 at Unknown time  . diclofenac sodium (VOLTAREN) 1 % GEL Apply 2 g topically daily as needed (for pain). 100 g 0 Past Week at Unknown time  . feeding supplement, GLUCERNA SHAKE, (GLUCERNA SHAKE) LIQD Take 237 mLs by mouth 3 (three) times daily between meals. 30 Can 0 04/24/2015 at Unknown time  . fentaNYL (DURAGESIC - DOSED MCG/HR) 25 MCG/HR patch Place 1 patch (25 mcg total) onto the skin every 3 (three) days. 10 patch 0 04/25/2015 at Unknown time  . glucagon (GLUCAGON EMERGENCY) 1 MG injection Inject 1 mg into the vein once as needed. 1 each 12 04/23/2015  . insulin aspart (NOVOLOG) 100 UNIT/ML injection Inject 5 Units into the skin 3 (three) times daily with meals. 10 mL 11 04/25/2015 at Unknown time  . insulin glargine (LANTUS) 100 UNIT/ML injection Inject 0.3 mLs (30 Units total) into the skin at bedtime. (Patient taking differently: Inject 20 Units into the skin at bedtime. ) 10 mL 11 04/24/2015 at Unknown time  . loratadine (CLARITIN) 10 MG tablet Take 10 mg by mouth at bedtime.    04/24/2015 at Unknown time  . metoCLOPramide (REGLAN) 5 MG tablet Give 3 doses a week (Patient taking differently: Take 5 mg by mouth 2  (two) times a week. ) 12 tablet 11 04/25/2015 at Unknown time  . midodrine (PROAMATINE) 10 MG tablet Take 1 tablet (10 mg total) by mouth 3 (three) times daily with meals. 90 tablet 3 04/25/2015 at Unknown time  . mirtazapine (REMERON) 7.5 MG tablet Take 1 tablet (7.5 mg total) by mouth at bedtime. 30 tablet 3 04/24/2015 at Unknown time  . Multiple Vitamin (MULTIVITAMIN WITH MINERALS) TABS tablet Take 1 tablet by mouth daily.   04/24/2015 at Unknown time  . mupirocin ointment (BACTROBAN) 2 % 1 application QD  1 6/94/5038 at Unknown time  . omeprazole (PRILOSEC) 40 MG capsule Take 40 mg by mouth at bedtime.    04/24/2015 at Unknown time  . ondansetron (ZOFRAN ODT) 8 MG disintegrating tablet Take 1 tablet (8 mg total) by mouth every 8 (eight) hours as needed for nausea or vomiting. 60 tablet 5 Past Month at Unknown time  . Oxycodone HCl 10 MG TABS Take 1 tablet (10 mg total) by mouth every 6 (six) hours as needed. (Patient taking differently: Take 10-20 mg by mouth every 6 (six) hours as needed (pain). Pt can have 2 tablets when he goes to the wound center for dressing change) 150 tablet 0 04/25/2015 at Unknown time  . pregabalin (LYRICA) 100 MG capsule Take 1 capsule (100 mg total) by mouth 3 (three) times daily. Take 100 mg by mouth in the morning, take 100 mg by mouth in the afternoon, take 100 mg by mouth in the evening and take 200 mg by mouth at bedtime. 150 capsule 4 04/25/2015 at 1400  . metroNIDAZOLE (FLAGYL) 500 MG tablet Take 1 tablet (500 mg total) by mouth every 8 (eight) hours. (Patient not taking: Reported on 04/25/2015) 1 tablet 0 Taking  . nitrofurantoin, macrocrystal-monohydrate, (MACROBID) 100 MG capsule Take 1 capsule (100 mg total) by mouth 2 (two) times daily. (Patient not taking: Reported on 04/25/2015) 10 capsule 0 Taking  . Vancomycin (VANCOCIN) 750 MG/150ML SOLN Inject 150 mLs (750 mg total) into the vein every 12 (twelve) hours. (Patient not taking: Reported on 04/25/2015) 150 mL 0 Taking     Scheduled: . apixaban  5 mg Oral BID  . aztreonam  1  g Intravenous Q8H  . baclofen  10 mg Oral BID WC  . baclofen  20 mg Oral QHS  . Chlorhexidine Gluconate Cloth  6 each Topical Q0600  . feeding supplement (GLUCERNA SHAKE)  237 mL Oral TID BM  . feeding supplement (PRO-STAT SUGAR FREE 64)  30 mL Oral TID BM  . fentaNYL  25 mcg Transdermal Q72H  . insulin aspart  0-15 Units Subcutaneous TID WC  . insulin glargine  5 Units Subcutaneous Daily  . loratadine  10 mg Oral QHS  . midodrine  20 mg Oral TID WC  . mirtazapine  7.5 mg Oral QHS  . mupirocin ointment  1 application Nasal BID  . pregabalin  100 mg Oral TID AC  . pregabalin  200 mg Oral QHS  . sodium chloride  10-40 mL Intracatheter Q12H  . vancomycin  750 mg Intravenous Q24H   Continuous: . sodium chloride 150 mL (04/29/15 0543)  . sodium chloride    . dextrose Stopped (04/28/15 1832)  . norepinephrine (LEVOPHED) Adult infusion 2 mcg/min (04/29/15 0031)   [id number] **OR** acetaminophen, albuterol, ketorolac, ondansetron, oxyCODONE, sodium chloride Anti-infectives    Start     Dose/Rate Route Frequency Ordered Stop   04/28/15 2000  vancomycin (VANCOCIN) IVPB 750 mg/150 ml premix     750 mg 150 mL/hr over 60 Minutes Intravenous Every 24 hours 04/28/15 0731     04/26/15 0800  vancomycin (VANCOCIN) IVPB 750 mg/150 ml premix  Status:  Discontinued     750 mg 150 mL/hr over 60 Minutes Intravenous Every 12 hours 04/25/15 1715 04/28/15 0731   04/26/15 0200  aztreonam (AZACTAM) 1 g in dextrose 5 % 50 mL IVPB     1 g 100 mL/hr over 30 Minutes Intravenous Every 8 hours 04/25/15 1919     04/25/15 1715  vancomycin (VANCOCIN) IVPB 750 mg/150 ml premix     750 mg 150 mL/hr over 60 Minutes Intravenous STAT 04/25/15 1715 04/25/15 1906   04/25/15 1645  aztreonam (AZACTAM) 2 g in dextrose 5 % 50 mL IVPB     2 g 100 mL/hr over 30 Minutes Intravenous  Once 04/25/15 1643 04/25/15 1807      Results for orders placed or  performed during the hospital encounter of 04/25/15 (from the past 48 hour(s))  Glucose, capillary     Status: Abnormal   Collection Time: 04/27/15  4:20 PM  Result Value Ref Range   Glucose-Capillary 136 (H) 65 - 99 mg/dL   Comment 1 Notify RN    Comment 2 Document in Chart   Glucose, capillary     Status: Abnormal   Collection Time: 04/27/15  6:29 PM  Result Value Ref Range   Glucose-Capillary 117 (H) 65 - 99 mg/dL   Comment 1 Notify RN    Comment 2 Document in Chart   Glucose, capillary     Status: Abnormal   Collection Time: 04/27/15  8:27 PM  Result Value Ref Range   Glucose-Capillary 157 (H) 65 - 99 mg/dL  Glucose, capillary     Status: Abnormal   Collection Time: 04/27/15 10:10 PM  Result Value Ref Range   Glucose-Capillary 171 (H) 65 - 99 mg/dL   Comment 1 Notify RN    Comment 2 Document in Chart   Glucose, capillary     Status: Abnormal   Collection Time: 04/27/15 11:46 PM  Result Value Ref Range   Glucose-Capillary 140 (H) 65 - 99 mg/dL  Glucose, capillary  Status: Abnormal   Collection Time: 04/28/15  2:28 AM  Result Value Ref Range   Glucose-Capillary 164 (H) 65 - 99 mg/dL   Comment 1 Notify RN    Comment 2 Document in Chart   Glucose, capillary     Status: Abnormal   Collection Time: 04/28/15  4:32 AM  Result Value Ref Range   Glucose-Capillary 144 (H) 65 - 99 mg/dL   Comment 1 Notify RN    Comment 2 Document in Chart   CBC     Status: Abnormal   Collection Time: 04/28/15  6:40 AM  Result Value Ref Range   WBC 31.1 (H) 4.0 - 10.5 K/uL    Comment: CONSISTENT WITH PREVIOUS RESULT   RBC 3.66 (L) 4.22 - 5.81 MIL/uL   Hemoglobin 9.3 (L) 13.0 - 17.0 g/dL   HCT 28.9 (L) 39.0 - 52.0 %   MCV 79.0 78.0 - 100.0 fL   MCH 25.4 (L) 26.0 - 34.0 pg   MCHC 32.2 30.0 - 36.0 g/dL   RDW 15.6 (H) 11.5 - 15.5 %   Platelets 625 (H) 150 - 400 K/uL  Comprehensive metabolic panel     Status: Abnormal   Collection Time: 04/28/15  6:40 AM  Result Value Ref Range   Sodium  136 135 - 145 mmol/L   Potassium 3.5 3.5 - 5.1 mmol/L   Chloride 117 (H) 101 - 111 mmol/L   CO2 15 (L) 22 - 32 mmol/L   Glucose, Bld 155 (H) 65 - 99 mg/dL   BUN 11 6 - 20 mg/dL   Creatinine, Ser 0.42 (L) 0.61 - 1.24 mg/dL   Calcium 8.2 (L) 8.9 - 10.3 mg/dL   Total Protein 5.3 (L) 6.5 - 8.1 g/dL   Albumin 1.1 (L) 3.5 - 5.0 g/dL   AST 8 (L) 15 - 41 U/L   ALT 7 (L) 17 - 63 U/L   Alkaline Phosphatase 173 (H) 38 - 126 U/L   Total Bilirubin 0.4 0.3 - 1.2 mg/dL   GFR calc non Af Amer >60 >60 mL/min   GFR calc Af Amer >60 >60 mL/min    Comment: (NOTE) The eGFR has been calculated using the CKD EPI equation. This calculation has not been validated in all clinical situations. eGFR's persistently <60 mL/min signify possible Chronic Kidney Disease.    Anion gap 4 (L) 5 - 15  Vancomycin, trough     Status: Abnormal   Collection Time: 04/28/15  6:40 AM  Result Value Ref Range   Vancomycin Tr 25 (H) 10.0 - 20.0 ug/mL  Glucose, capillary     Status: Abnormal   Collection Time: 04/28/15 12:47 PM  Result Value Ref Range   Glucose-Capillary 103 (H) 65 - 99 mg/dL   Comment 1 Notify RN    Comment 2 Document in Chart   Culture, blood (routine x 2)     Status: None (Preliminary result)   Collection Time: 04/28/15  1:20 PM  Result Value Ref Range   Specimen Description BLOOD LEFT ARM    Special Requests AEROCOCCUS SPECIES 1CC    Culture      NO GROWTH < 24 HOURS Performed at [facility]    Report Status PENDING   Glucose, capillary     Status: Abnormal   Collection Time: 04/28/15  4:00 PM  Result Value Ref Range   Glucose-Capillary 168 (H) 65 - 99 mg/dL  Culture, blood (routine x 2)     Status: None (Preliminary result)   Collection  Time: 04/28/15  5:50 PM  Result Value Ref Range   Specimen Description BLOOD PICC LINE    Special Requests BOTTLES DRAWN AEROBIC AND ANAEROBIC 5CC EACH    Culture      NO GROWTH < 24 HOURS Performed at [facility]    Report Status PENDING    Glucose, capillary     Status: Abnormal   Collection Time: 04/28/15 10:00 PM  Result Value Ref Range   Glucose-Capillary 292 (H) 65 - 99 mg/dL   Comment 1 Notify RN   CBC with Differential/Platelet     Status: Abnormal   Collection Time: 04/29/15  3:52 AM  Result Value Ref Range   WBC 34.6 (H) 4.0 - 10.5 K/uL   RBC 4.03 (L) 4.22 - 5.81 MIL/uL   Hemoglobin 10.2 (L) 13.0 - 17.0 g/dL   HCT 32.3 (L) 39.0 - 52.0 %   MCV 80.1 78.0 - 100.0 fL   MCH 25.3 (L) 26.0 - 34.0 pg   MCHC 31.6 30.0 - 36.0 g/dL   RDW 16.1 (H) 11.5 - 15.5 %   Platelets 726 (H) 150 - 400 K/uL   Neutrophils Relative % 85 (H) 43 - 77 %   Lymphocytes Relative 7 (L) 12 - 46 %   Monocytes Relative 7 3 - 12 %   Eosinophils Relative 1 0 - 5 %   Basophils Relative 0 0 - 1 %   Neutro Abs 29.5 (H) 1.7 - 7.7 K/uL   Lymphs Abs 2.4 0.7 - 4.0 K/uL   Monocytes Absolute 2.4 (H) 0.1 - 1.0 K/uL   Eosinophils Absolute 0.3 0.0 - 0.7 K/uL   Basophils Absolute 0.0 0.0 - 0.1 K/uL   WBC Morphology TOXIC GRANULATION   Basic metabolic panel     Status: Abnormal   Collection Time: 04/29/15  3:52 AM  Result Value Ref Range   Sodium 134 (L) 135 - 145 mmol/L   Potassium 3.8 3.5 - 5.1 mmol/L   Chloride 116 (H) 101 - 111 mmol/L   CO2 14 (L) 22 - 32 mmol/L   Glucose, Bld 394 (H) 65 - 99 mg/dL   BUN 13 6 - 20 mg/dL   Creatinine, Ser 0.47 (L) 0.61 - 1.24 mg/dL   Calcium 7.8 (L) 8.9 - 10.3 mg/dL   GFR calc non Af Amer >60 >60 mL/min   GFR calc Af Amer >60 >60 mL/min    Comment: (NOTE) The eGFR has been calculated using the CKD EPI equation. This calculation has not been validated in all clinical situations. eGFR's persistently <60 mL/min signify possible Chronic Kidney Disease.    Anion gap 4 (L) 5 - 15  Glucose, capillary     Status: Abnormal   Collection Time: 04/29/15  8:16 AM  Result Value Ref Range   Glucose-Capillary 337 (H) 65 - 99 mg/dL   Comment 1 Notify RN    Comment 2 Document in Chart   Glucose, capillary     Status:  Abnormal   Collection Time: 04/29/15 12:28 PM  Result Value Ref Range   Glucose-Capillary 262 (H) 65 - 99 mg/dL    Mr Pelvis Wo Contrast  04/28/2015   CLINICAL DATA:  Sacral decubitus ulcer.  Quadriplegia.  EXAM: MRI PELVIS WITHOUT CONTRAST  TECHNIQUE: Multiplanar multisequence MR imaging of the pelvis was performed. No intravenous contrast was administered.  COMPARISON:  MRIs dated 02/19/2015 and 01/08/2015  FINDINGS: There is a abscess involving the deep aspect of the right internal obturator muscle. The pus collection  is approximately 2 cm in diameter and contains air. The abscess is immediately medial to the roof of the right acetabulum.  The deep decubitus ulcers overlying the ischial tuberosities extend more deeply than on the prior study. There has a either been debridement or destruction of a portion of the right inferior pubic ramus and right ischial tuberosities since the prior study. There is persistent abnormal signal in the remnants of the right inferior pubic ramus extending to the right acetabulum consistent with chronic osteomyelitis.  The left decubitus ulcer now extends directly to the surface of the left ischial tuberosity but there is no bone destruction to suggest osteomyelitis.  Since the prior study the central portion of the fourth sacral segment has been resected or destroyed. No visible osteomyelitis of the sacral remnants.  Suprapubic catheter noted in the bladder. Diffuse thickening of the bladder wall.  IMPRESSION: 1. Right internal obturator abscess, appearing to extend from the right ischial tuberosity decubitus ulcer. 2. Persistent abnormal signal from the right inferior pubic ramus remnants extending into the posterior aspect of the right acetabulum. 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   Electronically Signed   By: Lorriane Shire M.D.   On: 04/28/2015 12:34   Mr Heel Left Wo Contrast  04/28/2015   CLINICAL DATA:  Heel pain.  Type 1 diabetes.  EXAM: MR OF THE LEFT  HEEL WITHOUT CONTRAST  TECHNIQUE: Multiplanar, multisequence MR imaging was performed. No intravenous contrast was administered.  COMPARISON:  01/11/2015  FINDINGS: Peroneal: Peroneal longus tendon intact. Peroneal brevis intact.  Posteromedial: Posterior tibial tendon intact. Flexor hallucis longus tendon intact. Flexor digitorum longus tendon intact.  Anterior: Tibialis anterior tendon intact. Extensor hallucis longus tendon intact Extensor digitorum longus tendon intact.  Achilles: Intact.  Plantar Fascia: Intact.  LIGAMENTS  Medial: Deltoid ligament intact. Spring ligament intact.  Lateral: Anterior talofibular ligament intact. Posterior talofibular ligament intact. Anterior and posterior tibiofibular ligaments intact.  Cartilage: General chondral thinning of the tibiotalar joint.  Ankle Joint: No joint effusion. No dislocation.  Subtalar Joint: No joint effusion. Normal subtalar joints.  Sinus Tarsi: Normal.  Bones: No marrow signal abnormality. No bone destruction or periosteal reaction.  Soft tissue: Small soft tissue ulcer over the posterior calcaneus with adjacent soft tissue edema within the subcutaneous fat. No drainable fluid collection or hematoma. Increased T2 signal throughout the plantar musculature likely neurogenic.  IMPRESSION: 1. No osteomyelitis of the left ankle. Soft tissue ulcer and surrounding edema overlying plantar aspect of the posterior calcaneus without an abscess.   Electronically Signed   By: Kathreen Devoid   On: 04/28/2015 11:41    Review of Systems  Unable to perform ROS: other   Blood pressure 87/66, pulse 75, temperature 97.6 F (36.4 C), temperature source Oral, resp. rate 16, height 5' 8"  (1.727 m), weight 59.4 kg (130 lb 15.3 oz), SpO2 100 %. Physical Exam  Constitutional:  Chronically ill 29 y/o with quadriplegia. BP 87/66 mmHg  Pulse 75  Temp(Src) 97.3 F (36.3 C) (Axillary)  Resp 16  Ht 5' 8"  (1.727 m)  Wt 59.4 kg (130 lb 15.3 oz)  BMI 19.92 kg/m2  SpO2  100% On pressors sodium chloride Last Rate: 150 mL (04/29/15 0543) sodium chloride dextrose Last Rate: Stopped (04/28/15 1832) norepinephrine (LEVOPHED) Adult infusion Last Rate: 5 mcg/min (04/29/15 1633)    HENT:  Head: Normocephalic.  Eyes: Conjunctivae are normal. Right eye exhibits no discharge. Left eye exhibits no discharge. No scleral icterus.  Neck: Normal range of motion.  Neck supple. No JVD present. No tracheal deviation present.  Cardiovascular: Normal rate, regular rhythm, normal heart sounds and intact distal pulses.   No murmur heard. Respiratory: Effort normal and breath sounds normal. No respiratory distress. He has no wheezes. He has no rales. He exhibits no tenderness.  GI: Soft. Bowel sounds are normal. He exhibits no distension and no mass. There is no tenderness. There is no rebound and no guarding.  Ostomy functional  Genitourinary:  Supra pubic catheter in place.  Musculoskeletal: He exhibits edema and tenderness.  Neurological: He is alert. No cranial nerve deficit.  Skin:  Sacrum 7 cm x 5 cm x 4 cm Left ischium 6 cm x 4 cm x 3.5 cm Right ischium 7 cm x 4 cm x 3 cm Wound bed: left and mid back 100% pale pink and moist with periwound scarring Sacrum and bilateral ischium are beefy red, bleeds easily These measurements are from wound care. I did not measure the areas on his back, but I have pictures along with the three sites on his buttocks and right hip   Back 04/29/15    Buttocks and right hip 04/29/15    Upper right buttocks   Assessment/Plan: 1.  Sepsis with MRSA positive blood cultures; E coli UTI, and Osteomyelitis from decubitus, with right internal obturator abscess. 2.  Hypotension with ongoing pressor support 3.  C6 cord infarct with quadriplegia 4.  Multiple decubitus with sacral and ischial debridements by Dr. Migdalia Dk and [place]. 5.  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 Georgette Dover  7.  Multiple medical issues 8.  Malnutrition and  deconditioning  Plan:  I have taken down all the dressing and have examined with Dr. Excell Seltzer.  There are a few areas that could use some bedside debridement, but nothing to explain the current level of sepsis.  We have placed him on wet to dry dressing for the decubitus.  If Dr. Migdalia Dk does not see him we can do some bedside debridement this weekend or next week.  We have recommended Orthopedics see him and make recommendations about the 2 cm abscess seen on MRI and I have discussed this with Dr. Karleen Hampshire.  We will follow with you for now.  ID and currently Dr. Johnnye Sima is ordering antibiotics.     Paiden Caraveo 04/29/2015, 4:02 PM

## 2015-04-29 NOTE — Progress Notes (Signed)
INFECTIOUS DISEASE PROGRESS NOTE  ID: Jason [name] is a 29 y.o. male with  Active Problems:   Type 1 diabetes, uncontrolled, with neuropathy   Spinal cord infarction (history of)   Sepsis   Stage IV decubitus ulcer   Urinary tract infectious disease   HCAP (healthcare-associated pneumonia)  Subjective: Resting quietly.   Abtx:  Anti-infectives    Start     Dose/Rate Route Frequency Ordered Stop   04/28/15 2000  vancomycin (VANCOCIN) IVPB 750 mg/150 ml premix     750 mg 150 mL/hr over 60 Minutes Intravenous Every 24 hours 04/28/15 0731     04/26/15 0800  vancomycin (VANCOCIN) IVPB 750 mg/150 ml premix  Status:  Discontinued     750 mg 150 mL/hr over 60 Minutes Intravenous Every 12 hours 04/25/15 1715 04/28/15 0731   04/26/15 0200  aztreonam (AZACTAM) 1 g in dextrose 5 % 50 mL IVPB     1 g 100 mL/hr over 30 Minutes Intravenous Every 8 hours 04/25/15 1919     04/25/15 1715  vancomycin (VANCOCIN) IVPB 750 mg/150 ml premix     750 mg 150 mL/hr over 60 Minutes Intravenous STAT 04/25/15 1715 04/25/15 1906   04/25/15 1645  aztreonam (AZACTAM) 2 g in dextrose 5 % 50 mL IVPB     2 g 100 mL/hr over 30 Minutes Intravenous  Once 04/25/15 1643 04/25/15 1807      Medications:  Scheduled: . apixaban  5 mg Oral BID  . aztreonam  1 g Intravenous Q8H  . baclofen  10 mg Oral BID WC  . baclofen  20 mg Oral QHS  . Chlorhexidine Gluconate Cloth  6 each Topical Q0600  . feeding supplement (GLUCERNA SHAKE)  237 mL Oral TID BM  . feeding supplement (PRO-STAT SUGAR FREE 64)  30 mL Oral TID BM  . fentaNYL  25 mcg Transdermal Q72H  . insulin aspart  0-15 Units Subcutaneous TID WC  . insulin glargine  5 Units Subcutaneous Daily  . loratadine  10 mg Oral QHS  . midodrine  20 mg Oral TID WC  . mirtazapine  7.5 mg Oral QHS  . mupirocin ointment  1 application Nasal BID  . pregabalin  100 mg Oral TID AC  . pregabalin  200 mg Oral QHS  . sodium chloride  10-40 mL Intracatheter Q12H  .  vancomycin  750 mg Intravenous Q24H    Objective: Vital signs in last 24 hours: Temp:  [97.3 F (36.3 C)-98.1 F (36.7 C)] 97.3 F (36.3 C) (07/22 1600) Pulse Rate:  [75] 75 (07/22 1339) Resp:  [13-27] 16 (07/22 1500) BP: (69-105)/(42-87) 87/66 mmHg (07/22 1500) SpO2:  [93 %-100 %] 100 % (07/22 1500) Weight:  [59.4 kg (130 lb 15.3 oz)] 59.4 kg (130 lb 15.3 oz) (07/22 0545)   General appearance: no distress Resp: clear to auscultation bilaterally Cardio: regular rate and rhythm GI: normal findings: bowel sounds normal and soft, non-tender  Lab Results  Recent Labs  04/28/15 0640 04/29/15 0352  WBC 31.1* 34.6*  HGB 9.3* 10.2*  HCT 28.9* 32.3*  NA 136 134*  K 3.5 3.8  CL 117* 116*  CO2 15* 14*  BUN 11 13  CREATININE 0.42* 0.47*   Liver Panel  Recent Labs  04/27/15 0815 04/28/15 0640  PROT 6.0* 5.3*  ALBUMIN 1.2* 1.1*  AST 12* 8*  ALT 7* 7*  ALKPHOS 206* 173*  BILITOT 0.5 0.4   Sedimentation Rate No results for input(s): ESRSEDRATE in the  last 72 hours. C-Reactive Protein No results for input(s): CRP in the last 72 hours.  Microbiology: Recent Results (from the past 240 hour(s))  Urine culture     Status: None   Collection Time: 04/25/15  4:35 PM  Result Value Ref Range Status   Specimen Description URINE, CLEAN CATCH  Final   Special Requests Normal  Final   Culture   Final    >=100,000 COLONIES/mL ESCHERICHIA COLI Performed at [facility]    Report Status 04/28/2015 FINAL  Final   Organism ID, Bacteria ESCHERICHIA COLI  Final      Susceptibility   Escherichia coli - MIC*    AMPICILLIN <=2 SENSITIVE Sensitive     CEFAZOLIN <=4 SENSITIVE Sensitive     CEFTRIAXONE <=1 SENSITIVE Sensitive     CIPROFLOXACIN >=4 RESISTANT Resistant     GENTAMICIN <=1 SENSITIVE Sensitive     IMIPENEM <=0.25 SENSITIVE Sensitive     NITROFURANTOIN <=16 SENSITIVE Sensitive     TRIMETH/SULFA <=20 SENSITIVE Sensitive     AMPICILLIN/SULBACTAM <=2 SENSITIVE  Sensitive     PIP/TAZO <=4 SENSITIVE Sensitive     * >=100,000 COLONIES/mL ESCHERICHIA COLI  Blood culture (routine x 2)     Status: None (Preliminary result)   Collection Time: 04/25/15  5:00 PM  Result Value Ref Range Status   Specimen Description BLOOD RIGHT ANTECUBITAL  Final   Special Requests BOTTLES DRAWN AEROBIC AND ANAEROBIC 5 CC EA  Final   Culture  Setup Time   Final    GRAM POSITIVE COCCI IN CLUSTERS Gram Stain Report Called to,Read Back By and Verified With: Elveria Rising RN 17:30 04/26/15 ([email or web address]) AEROBIC BOTTLE ONLY    Culture   Final    METHICILLIN RESISTANT STAPHYLOCOCCUS AUREUS GRAM POSITIVE COCCI Performed at [facility]    Report Status PENDING  Incomplete   Organism ID, Bacteria METHICILLIN RESISTANT STAPHYLOCOCCUS AUREUS  Final      Susceptibility   Methicillin resistant staphylococcus aureus - MIC*    CIPROFLOXACIN >=8 RESISTANT Resistant     ERYTHROMYCIN >=8 RESISTANT Resistant     GENTAMICIN <=0.5 SENSITIVE Sensitive     OXACILLIN >=4 RESISTANT Resistant     TETRACYCLINE <=1 SENSITIVE Sensitive     VANCOMYCIN <=0.5 SENSITIVE Sensitive     TRIMETH/SULFA >=320 RESISTANT Resistant     CLINDAMYCIN >=8 RESISTANT Resistant     RIFAMPIN <=0.5 SENSITIVE Sensitive     Inducible Clindamycin NEGATIVE Sensitive     * METHICILLIN RESISTANT STAPHYLOCOCCUS AUREUS  Blood culture (routine x 2)     Status: None   Collection Time: 04/25/15  5:16 PM  Result Value Ref Range Status   Specimen Description BLOOD BLOOD RIGHT FOREARM  Final   Special Requests BOTTLES DRAWN AEROBIC AND ANAEROBIC 5 CC EA  Final   Culture  Setup Time   Final    GRAM POSITIVE COCCI IN CLUSTERS AEROBIC BOTTLE ONLY CRITICAL RESULT CALLED TO, READ BACK BY AND VERIFIED WITH: S GRAHAM 04/26/15 @ 22 M VESTAL    Culture   Final    STAPHYLOCOCCUS AUREUS SUSCEPTIBILITIES PERFORMED ON PREVIOUS CULTURE WITHIN THE LAST 5 DAYS. Performed at [facility]    Report Status 04/28/2015 FINAL   Final  MRSA PCR Screening     Status: Abnormal   Collection Time: 04/25/15  6:55 PM  Result Value Ref Range Status   MRSA by PCR POSITIVE (A) NEGATIVE Final    Comment:        The GeneXpert  MRSA Assay (FDA approved for NASAL specimens only), is one component of a comprehensive MRSA colonization surveillance program. It is not intended to diagnose MRSA infection nor to guide or monitor treatment for MRSA infections. RESULT CALLED TO, READ BACK BY AND VERIFIED WITH: Karlene Einstein [id number] @ 2043 BY J SCOTTON   Culture, blood (routine x 2)     Status: None (Preliminary result)   Collection Time: 04/28/15  1:20 PM  Result Value Ref Range Status   Specimen Description BLOOD LEFT ARM  Final   Special Requests AEROCOCCUS SPECIES 1CC  Final   Culture   Final    NO GROWTH < 24 HOURS Performed at [facility]    Report Status PENDING  Incomplete  Culture, blood (routine x 2)     Status: None (Preliminary result)   Collection Time: 04/28/15  5:50 PM  Result Value Ref Range Status   Specimen Description BLOOD PICC LINE  Final   Special Requests BOTTLES DRAWN AEROBIC AND ANAEROBIC 5CC EACH  Final   Culture   Final    NO GROWTH < 24 HOURS Performed at [facility]    Report Status PENDING  Incomplete    Studies/Results: Mr Pelvis Wo Contrast  04/28/2015   CLINICAL DATA:  Sacral decubitus ulcer.  Quadriplegia.  EXAM: MRI PELVIS WITHOUT CONTRAST  TECHNIQUE: Multiplanar multisequence MR imaging of the pelvis was performed. No intravenous contrast was administered.  COMPARISON:  MRIs dated 02/19/2015 and 01/08/2015  FINDINGS: There is a abscess involving the deep aspect of the right internal obturator muscle. The pus collection is approximately 2 cm in diameter and contains air. The abscess is immediately medial to the roof of the right acetabulum.  The deep decubitus ulcers overlying the ischial tuberosities extend more deeply than on the prior study. There has a either been  debridement or destruction of a portion of the right inferior pubic ramus and right ischial tuberosities since the prior study. There is persistent abnormal signal in the remnants of the right inferior pubic ramus extending to the right acetabulum consistent with chronic osteomyelitis.  The left decubitus ulcer now extends directly to the surface of the left ischial tuberosity but there is no bone destruction to suggest osteomyelitis.  Since the prior study the central portion of the fourth sacral segment has been resected or destroyed. No visible osteomyelitis of the sacral remnants.  Suprapubic catheter noted in the bladder. Diffuse thickening of the bladder wall.  IMPRESSION: 1. Right internal obturator abscess, appearing to extend from the right ischial tuberosity decubitus ulcer. 2. Persistent abnormal signal from the right inferior pubic ramus remnants extending into the posterior aspect of the right acetabulum. 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   Electronically Signed   By: Lorriane Shire M.D.   On: 04/28/2015 12:34   Mr Heel Left Wo Contrast  04/28/2015   CLINICAL DATA:  Heel pain.  Type 1 diabetes.  EXAM: MR OF THE LEFT HEEL WITHOUT CONTRAST  TECHNIQUE: Multiplanar, multisequence MR imaging was performed. No intravenous contrast was administered.  COMPARISON:  01/11/2015  FINDINGS: Peroneal: Peroneal longus tendon intact. Peroneal brevis intact.  Posteromedial: Posterior tibial tendon intact. Flexor hallucis longus tendon intact. Flexor digitorum longus tendon intact.  Anterior: Tibialis anterior tendon intact. Extensor hallucis longus tendon intact Extensor digitorum longus tendon intact.  Achilles: Intact.  Plantar Fascia: Intact.  LIGAMENTS  Medial: Deltoid ligament intact. Spring ligament intact.  Lateral: Anterior talofibular ligament intact. Posterior talofibular ligament intact. Anterior and posterior tibiofibular  ligaments intact.  Cartilage: General chondral thinning of the tibiotalar  joint.  Ankle Joint: No joint effusion. No dislocation.  Subtalar Joint: No joint effusion. Normal subtalar joints.  Sinus Tarsi: Normal.  Bones: No marrow signal abnormality. No bone destruction or periosteal reaction.  Soft tissue: Small soft tissue ulcer over the posterior calcaneus with adjacent soft tissue edema within the subcutaneous fat. No drainable fluid collection or hematoma. Increased T2 signal throughout the plantar musculature likely neurogenic.  IMPRESSION: 1. No osteomyelitis of the left ankle. Soft tissue ulcer and surrounding edema overlying plantar aspect of the posterior calcaneus without an abscess.   Electronically Signed   By: Kathreen Devoid   On: 04/28/2015 11:41     Assessment/Plan: MRSA bacteremia Decubitus ulcers  R internal obturator abscess L heel wound- no abscess or osteo UTI- E coli ? HCAP Thrombocytosis, leukocytosis Quadraplegia Protein calorie malnutrition, severe  Total days of antibiotics: 5 vanco/aztreonam  Repeat BCx are ngtd TEE result pending My appreciation to surgery, abscess debridement No change in anbx for now.  Hopefully WBC will come down with I & D?         [name] Infectious Diseases (pager) [phone] [email or web address] 04/29/2015, 5:06 PM  LOS: 4 days

## 2015-04-29 NOTE — Progress Notes (Signed)
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 He was admitted for further evaluation of hypotension.   Assessment/Plan: 1. CXR findings of bibasilar infiltrate concerning for atypical pneumonia, without any symptomatology of pneumonia.  Started on on IV vancomycin and IV azactum. Blood cultures from 7/18 showed MRSA  Sensitive to vancomycin and tetracyclines.  Afebrile overnight, repeat blood cultures pending and negative so far.  Appreciate infectious disease Dr Algis Downs input.     2. MRSA bacteremia:  On IV vancomycin.  Requested cardiology for TEE. Initially scheduled for TEE today, but since he was on levophed today, it could not be done.   Sacral decubitus ulcer: Wound care following.  MRI of the sacrum, pelvis and left foot ordered as per recommendations of ID. Results shows chronic osteomyelitis and right internal obturator abscess extending fromt he right ischial tuberosity decubitus ulcer. No osteomyelitis of the left foot heel.  Surgery consulted and recommendations given. He underwent bedside debridement.  Orthopedics will be consulted for further recommendations.    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  He resides at home and mother is the caregiver.    UTI ? Vs colonization: - unfortunately urine cultures could not be obtained. .   Type 1 DM: CBG (last 3)   Recent Labs  04/29/15 0816 04/29/15 1228 04/29/15 1603  GLUCAP 337* 262* 120*   cbg's are improving. His hemoglobin a1c is 8.3, started him on small dose of lantus and SSI.     Code Status: FULL CODE.  Family Communication: mother at bedside Disposition Plan: pending further work up.    Consultants:  INFECTIOUS  DISEASE.   Procedures:  MRI of the pelvis / sacrum  Antibiotics:  Vancomycin   Aztreonam.  HPI/Subjective: Back pain improved.  APPEARS COMFORTABLE.   Objective: Filed Vitals:   04/29/15 1600  BP:   Pulse:   Temp: 97.3 F (36.3 C)  Resp:     Intake/Output Summary (Last 24 hours) at 04/29/15 1810 Last data filed at 04/29/15 1500  Gross per 24 hour  Intake 3731.63 ml  Output   2250 ml  Net 1481.63 ml   Filed Weights   04/25/15 1711 04/25/15 1930 04/29/15 0545  Weight: 49.896 kg (110 lb) 43.7 kg (96 lb 5.5 oz) 59.4 kg (130 lb 15.3 oz)    Exam:   General:  Lethargic,but answering questions appropriately  Cardiovascular: s1s2  Respiratory: diminished at bases, no wheezing or rhonchi  Abdomen: soft non tender non distended bowel sounds heard  Musculoskeletal: no edema , left foot ulcer on heel.   Data Reviewed: Basic Metabolic Panel:  Recent Labs Lab 04/25/15 1612 04/26/15 0400 04/27/15 0815 04/28/15 0640 04/29/15 0352  NA 130* 134* 137 136 134*  K 4.7 3.9 3.4* 3.5 3.8  CL 105 112* 117* 117* 116*  CO2 20* 17* 17* 15* 14*  GLUCOSE 116* 199* 77 155* 394*  BUN 20 21* 14 11 13   CREATININE 0.68 0.52* 0.40* 0.42* 0.47*  CALCIUM 10.2 8.8* 8.8* 8.2* 7.8*  MG 1.5*  --   --   --   --    Liver Function Tests:  Recent Labs Lab 04/25/15 1612 04/26/15 0400 04/27/15 0815 04/28/15 0640  AST 12* 10* 12* 8*  ALT 7* 7* 7* 7*  ALKPHOS 160* 133*  206* 173*  BILITOT 0.3 0.6 0.5 0.4  PROT 7.3 6.0* 6.0* 5.3*  ALBUMIN 1.7* 1.3* 1.2* 1.1*   No results for input(s): LIPASE, AMYLASE in the last 168 hours. No results for input(s): AMMONIA in the last 168 hours. CBC:  Recent Labs Lab 04/25/15 1612 04/26/15 0400 04/27/15 0815 04/28/15 0640 04/29/15 0352  WBC 30.1* 35.2* 29.1* 31.1* 34.6*  NEUTROABS  --   --   --   --  29.5*  HGB 7.0* 6.9* 9.7* 9.3* 10.2*  HCT 22.8* 22.5* 30.6* 28.9* 32.3*  MCV 79.7 79.8 79.9 79.0 80.1  PLT 732* 773* 755* 625* 726*    Cardiac Enzymes: No results for input(s): CKTOTAL, CKMB, CKMBINDEX, TROPONINI in the last 168 hours. BNP (last 3 results) No results for input(s): BNP in the last 8760 hours.  ProBNP (last 3 results) No results for input(s): PROBNP in the last 8760 hours.  CBG:  Recent Labs Lab 04/28/15 1600 04/28/15 2200 04/29/15 0816 04/29/15 1228 04/29/15 1603  GLUCAP 168* 292* 337* 262* 120*    Recent Results (from the past 240 hour(s))  Urine culture     Status: None   Collection Time: 04/25/15  4:35 PM  Result Value Ref Range Status   Specimen Description URINE, CLEAN CATCH  Final   Special Requests Normal  Final   Culture   Final    >=100,000 COLONIES/mL ESCHERICHIA COLI Performed at [facility]    Report Status 04/28/2015 FINAL  Final   Organism ID, Bacteria ESCHERICHIA COLI  Final      Susceptibility   Escherichia coli - MIC*    AMPICILLIN <=2 SENSITIVE Sensitive     CEFAZOLIN <=4 SENSITIVE Sensitive     CEFTRIAXONE <=1 SENSITIVE Sensitive     CIPROFLOXACIN >=4 RESISTANT Resistant     GENTAMICIN <=1 SENSITIVE Sensitive     IMIPENEM <=0.25 SENSITIVE Sensitive     NITROFURANTOIN <=16 SENSITIVE Sensitive     TRIMETH/SULFA <=20 SENSITIVE Sensitive     AMPICILLIN/SULBACTAM <=2 SENSITIVE Sensitive     PIP/TAZO <=4 SENSITIVE Sensitive     * >=100,000 COLONIES/mL ESCHERICHIA COLI  Blood culture (routine x 2)     Status: None (Preliminary result)   Collection Time: 04/25/15  5:00 PM  Result Value Ref Range Status   Specimen Description BLOOD RIGHT ANTECUBITAL  Final   Special Requests BOTTLES DRAWN AEROBIC AND ANAEROBIC 5 CC EA  Final   Culture  Setup Time   Final    GRAM POSITIVE COCCI IN CLUSTERS Gram Stain Report Called to,Read Back By and Verified With: Elveria Rising RN 17:30 04/26/15 ([email or web address]) AEROBIC BOTTLE ONLY    Culture   Final    METHICILLIN RESISTANT STAPHYLOCOCCUS AUREUS GRAM POSITIVE COCCI Performed at [facility]    Report Status PENDING   Incomplete   Organism ID, Bacteria METHICILLIN RESISTANT STAPHYLOCOCCUS AUREUS  Final      Susceptibility   Methicillin resistant staphylococcus aureus - MIC*    CIPROFLOXACIN >=8 RESISTANT Resistant     ERYTHROMYCIN >=8 RESISTANT Resistant     GENTAMICIN <=0.5 SENSITIVE Sensitive     OXACILLIN >=4 RESISTANT Resistant     TETRACYCLINE <=1 SENSITIVE Sensitive     VANCOMYCIN <=0.5 SENSITIVE Sensitive     TRIMETH/SULFA >=320 RESISTANT Resistant     CLINDAMYCIN >=8 RESISTANT Resistant     RIFAMPIN <=0.5 SENSITIVE Sensitive     Inducible Clindamycin NEGATIVE Sensitive     * METHICILLIN RESISTANT STAPHYLOCOCCUS AUREUS  Blood culture (routine x 2)  Status: None   Collection Time: 04/25/15  5:16 PM  Result Value Ref Range Status   Specimen Description BLOOD BLOOD RIGHT FOREARM  Final   Special Requests BOTTLES DRAWN AEROBIC AND ANAEROBIC 5 CC EA  Final   Culture  Setup Time   Final    GRAM POSITIVE COCCI IN CLUSTERS AEROBIC BOTTLE ONLY CRITICAL RESULT CALLED TO, READ BACK BY AND VERIFIED WITH: S GRAHAM 04/26/15 @ 59 M VESTAL    Culture   Final    STAPHYLOCOCCUS AUREUS SUSCEPTIBILITIES PERFORMED ON PREVIOUS CULTURE WITHIN THE LAST 5 DAYS. Performed at [facility]    Report Status 04/28/2015 FINAL  Final  MRSA PCR Screening     Status: Abnormal   Collection Time: 04/25/15  6:55 PM  Result Value Ref Range Status   MRSA by PCR POSITIVE (A) NEGATIVE Final    Comment:        The GeneXpert MRSA Assay (FDA approved for NASAL specimens only), is one component of a comprehensive MRSA colonization surveillance program. It is not intended to diagnose MRSA infection nor to guide or monitor treatment for MRSA infections. RESULT CALLED TO, READ BACK BY AND VERIFIED WITH: Karlene Einstein [id number] @ 2043 BY J SCOTTON   Culture, blood (routine x 2)     Status: None (Preliminary result)   Collection Time: 04/28/15  1:20 PM  Result Value Ref Range Status   Specimen Description  BLOOD LEFT ARM  Final   Special Requests AEROCOCCUS SPECIES 1CC  Final   Culture   Final    NO GROWTH < 24 HOURS Performed at [facility]    Report Status PENDING  Incomplete  Culture, blood (routine x 2)     Status: None (Preliminary result)   Collection Time: 04/28/15  5:50 PM  Result Value Ref Range Status   Specimen Description BLOOD PICC LINE  Final   Special Requests BOTTLES DRAWN AEROBIC AND ANAEROBIC 5CC EACH  Final   Culture   Final    NO GROWTH < 24 HOURS Performed at [facility]    Report Status PENDING  Incomplete     Studies: Mr Pelvis Wo Contrast  04/28/2015   CLINICAL DATA:  Sacral decubitus ulcer.  Quadriplegia.  EXAM: MRI PELVIS WITHOUT CONTRAST  TECHNIQUE: Multiplanar multisequence MR imaging of the pelvis was performed. No intravenous contrast was administered.  COMPARISON:  MRIs dated 02/19/2015 and 01/08/2015  FINDINGS: There is a abscess involving the deep aspect of the right internal obturator muscle. The pus collection is approximately 2 cm in diameter and contains air. The abscess is immediately medial to the roof of the right acetabulum.  The deep decubitus ulcers overlying the ischial tuberosities extend more deeply than on the prior study. There has a either been debridement or destruction of a portion of the right inferior pubic ramus and right ischial tuberosities since the prior study. There is persistent abnormal signal in the remnants of the right inferior pubic ramus extending to the right acetabulum consistent with chronic osteomyelitis.  The left decubitus ulcer now extends directly to the surface of the left ischial tuberosity but there is no bone destruction to suggest osteomyelitis.  Since the prior study the central portion of the fourth sacral segment has been resected or destroyed. No visible osteomyelitis of the sacral remnants.  Suprapubic catheter noted in the bladder. Diffuse thickening of the bladder wall.  IMPRESSION: 1. Right  internal obturator abscess, appearing to extend from the right ischial tuberosity decubitus ulcer.  2. Persistent abnormal signal from the right inferior pubic ramus remnants extending into the posterior aspect of the right acetabulum. 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   Electronically Signed   By: Lorriane Shire M.D.   On: 04/28/2015 12:34   Mr Heel Left Wo Contrast  04/28/2015   CLINICAL DATA:  Heel pain.  Type 1 diabetes.  EXAM: MR OF THE LEFT HEEL WITHOUT CONTRAST  TECHNIQUE: Multiplanar, multisequence MR imaging was performed. No intravenous contrast was administered.  COMPARISON:  01/11/2015  FINDINGS: Peroneal: Peroneal longus tendon intact. Peroneal brevis intact.  Posteromedial: Posterior tibial tendon intact. Flexor hallucis longus tendon intact. Flexor digitorum longus tendon intact.  Anterior: Tibialis anterior tendon intact. Extensor hallucis longus tendon intact Extensor digitorum longus tendon intact.  Achilles: Intact.  Plantar Fascia: Intact.  LIGAMENTS  Medial: Deltoid ligament intact. Spring ligament intact.  Lateral: Anterior talofibular ligament intact. Posterior talofibular ligament intact. Anterior and posterior tibiofibular ligaments intact.  Cartilage: General chondral thinning of the tibiotalar joint.  Ankle Joint: No joint effusion. No dislocation.  Subtalar Joint: No joint effusion. Normal subtalar joints.  Sinus Tarsi: Normal.  Bones: No marrow signal abnormality. No bone destruction or periosteal reaction.  Soft tissue: Small soft tissue ulcer over the posterior calcaneus with adjacent soft tissue edema within the subcutaneous fat. No drainable fluid collection or hematoma. Increased T2 signal throughout the plantar musculature likely neurogenic.  IMPRESSION: 1. No osteomyelitis of the left ankle. Soft tissue ulcer and surrounding edema overlying plantar aspect of the posterior calcaneus without an abscess.   Electronically Signed   By: Kathreen Devoid   On: 04/28/2015 11:41     Scheduled Meds: . apixaban  5 mg Oral BID  . aztreonam  1 g Intravenous Q8H  . baclofen  10 mg Oral BID WC  . baclofen  20 mg Oral QHS  . Chlorhexidine Gluconate Cloth  6 each Topical Q0600  . feeding supplement (GLUCERNA SHAKE)  237 mL Oral TID BM  . feeding supplement (PRO-STAT SUGAR FREE 64)  30 mL Oral TID BM  . fentaNYL  25 mcg Transdermal Q72H  . insulin aspart  0-15 Units Subcutaneous TID WC  . insulin glargine  5 Units Subcutaneous Daily  . loratadine  10 mg Oral QHS  . midodrine  20 mg Oral TID WC  . mirtazapine  7.5 mg Oral QHS  . mupirocin ointment  1 application Nasal BID  . pregabalin  100 mg Oral TID AC  . pregabalin  200 mg Oral QHS  . sodium chloride  10-40 mL Intracatheter Q12H  . vancomycin  750 mg Intravenous Q24H   Continuous Infusions: . sodium chloride 150 mL (04/29/15 0543)  . sodium chloride    . dextrose Stopped (04/28/15 1832)  . norepinephrine (LEVOPHED) Adult infusion 5 mcg/min (04/29/15 1633)    Active Problems:   Type 1 diabetes, uncontrolled, with neuropathy   Spinal cord infarction (history of)   Sepsis   Stage IV decubitus ulcer   Urinary tract infectious disease   HCAP (healthcare-associated pneumonia)    Time spent:35 min    Dolores Mcgovern  [facility] Pager [phone]  If 7PM-7AM, please contact night-coverage at [email or web address], password [facility] 04/29/2015, 6:10 PM  LOS: 4 days

## 2015-04-30 ENCOUNTER — Inpatient Hospital Stay (HOSPITAL_COMMUNITY): Payer: Medicaid Other

## 2015-04-30 DIAGNOSIS — A4102 Sepsis due to Methicillin resistant Staphylococcus aureus: Principal | ICD-10-CM

## 2015-04-30 LAB — CBC WITH DIFFERENTIAL/PLATELET
BASOS ABS: 0 10*3/uL (ref 0.0–0.1)
Basophils Relative: 0 % (ref 0–1)
EOS ABS: 0.3 10*3/uL (ref 0.0–0.7)
Eosinophils Relative: 1 % (ref 0–5)
HCT: 28.4 % — ABNORMAL LOW (ref 39.0–52.0)
Hemoglobin: 8.6 g/dL — ABNORMAL LOW (ref 13.0–17.0)
LYMPHS PCT: 9 % — AB (ref 12–46)
Lymphs Abs: 3 10*3/uL (ref 0.7–4.0)
MCH: 24.2 pg — AB (ref 26.0–34.0)
MCHC: 30.3 g/dL (ref 30.0–36.0)
MCV: 80 fL (ref 78.0–100.0)
Monocytes Absolute: 2.7 10*3/uL — ABNORMAL HIGH (ref 0.1–1.0)
Monocytes Relative: 8 % (ref 3–12)
NEUTROS ABS: 27.6 10*3/uL — AB (ref 1.7–7.7)
NEUTROS PCT: 82 % — AB (ref 43–77)
Platelets: 648 10*3/uL — ABNORMAL HIGH (ref 150–400)
RBC: 3.55 MIL/uL — ABNORMAL LOW (ref 4.22–5.81)
RDW: 16.3 % — AB (ref 11.5–15.5)
WBC: 33.6 10*3/uL — ABNORMAL HIGH (ref 4.0–10.5)

## 2015-04-30 LAB — BASIC METABOLIC PANEL
Anion gap: 3 — ABNORMAL LOW (ref 5–15)
BUN: 12 mg/dL (ref 6–20)
CHLORIDE: 119 mmol/L — AB (ref 101–111)
CO2: 13 mmol/L — AB (ref 22–32)
Calcium: 7.5 mg/dL — ABNORMAL LOW (ref 8.9–10.3)
Creatinine, Ser: 0.46 mg/dL — ABNORMAL LOW (ref 0.61–1.24)
GFR calc Af Amer: >60 mL/min (ref >60)
GFR calc non Af Amer: >60 mL/min (ref >60)
GLUCOSE: 178 mg/dL — AB (ref 65–99)
POTASSIUM: 3.1 mmol/L — AB (ref 3.5–5.1)
SODIUM: 135 mmol/L (ref 135–145)

## 2015-04-30 LAB — CULTURE, BLOOD (ROUTINE X 2)

## 2015-04-30 LAB — GLUCOSE, CAPILLARY
GLUCOSE-CAPILLARY: 160 mg/dL — AB (ref 65–99)
GLUCOSE-CAPILLARY: 161 mg/dL — AB (ref 65–99)
Glucose-Capillary: 155 mg/dL — ABNORMAL HIGH (ref 65–99)

## 2015-04-30 LAB — MAGNESIUM: Magnesium: 1.1 mg/dL — ABNORMAL LOW (ref 1.7–2.4)

## 2015-04-30 MED ORDER — POTASSIUM CHLORIDE CRYS ER 20 MEQ PO TBCR
40.0000 meq | EXTENDED_RELEASE_TABLET | Freq: Two times a day (BID) | ORAL | Status: AC
  Administered 2015-04-30 (×2): 40 meq via ORAL
  Filled 2015-04-30 (×2): qty 2

## 2015-04-30 MED ORDER — LIDOCAINE HCL 2 % IJ SOLN
INTRAMUSCULAR | Status: AC
  Filled 2015-04-30: qty 20

## 2015-04-30 MED ORDER — HYDROMORPHONE HCL 1 MG/ML IJ SOLN
1.0000 mg | Freq: Once | INTRAMUSCULAR | Status: AC
  Administered 2015-04-30: 1 mg via INTRAVENOUS

## 2015-04-30 NOTE — Progress Notes (Signed)
CRITICAL VALUE ALERT  Critical value received:  Gram + cocci clusters  Date of notification:  04/30/2015  Time of notification:  2222  Critical value read back:Yes.    Nurse who received alert:  Leola Brazil, RN  MD notified (1st page):  Jennet Maduro, NP  Time of first page:  2222  MD notified (2nd page):   Time of second page:  Responding MD:  Jennet Maduro, NP  Time MD responded:  2236

## 2015-04-30 NOTE — Consult Note (Signed)
ORTHOPAEDIC CONSULTATION  REQUESTING PHYSICIAN: Hosie Poisson, MD  Chief Complaint: R pelvis infection  HPI: Jason [name] is a 29 y.o. male who presents to the hospital due to hypotension and sepsis on 7/18.  Infectious disease was consulted for his chronic sacral wounds.  They recommended MRI of pelvis/sacrum.  Imaging revealed a small abscess in obturator foramen.   The patient is non-ambulatory at baseline due to a spinal infarct 14 months ago. Last September he began developing sacral wounds that have continued to progress in size.  The patient is treated by the wound clinic for these wounds and has also been evaluated by Dr. Migdalia Dk.  General surgery saw the patient earlier this morning, who recommended orthopedic consultation. They had performed a bedside debridement and packing of the sacral ulcers.   The decubitus ulcers have resulted in chronic osteomyelitis that has been managed with IV abx.  The patient currently has no pain of the R pelvis.  He has very poor sensation throughout his lower extremities, and is effectively an incomplete quadriplegic. He has a little bit of use of his upper extremities.  The mother was also very concerned with a left heel wound that had yet to be evaluated by wound care.   Past Medical History  Diagnosis Date  . GERD (gastroesophageal reflux disease)   . Asthma   . Hx MRSA infection     on face  . Gastroparesis   . Diabetic neuropathy   . Seizures   . Family history of anesthesia complication     Pt mother can't have epidural procedures  . Dysrhythmia   . Pneumonia   . Arthritis   . Fibromyalgia   . 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 Type I diabetes mellitus     sees Dr. Loanne Drilling   . Syncope 02/16/2015   Past Surgical History  Procedure Laterality Date  . Tonsillectomy    . Multiple extractions with alveoloplasty N/A 08/03/2014    Procedure: MULTIPLE EXTRACTIONS;  Surgeon: Gae Bon, DDS;  Location:  [place];  Service: Oral Surgery;  Laterality: N/A;  . Tee without cardioversion N/A 08/17/2014    Procedure: TRANSESOPHAGEAL ECHOCARDIOGRAM (TEE);  Surgeon: Dorothy Spark, MD;  Location: [facility] ENDOSCOPY;  Service: Cardiovascular;  Laterality: N/A;  . Debridment of decubitus ulcer N/A 10/04/2014    Procedure: DEBRIDMENT OF DECUBITUS ULCER;  Surgeon: Georganna Skeans, MD;  Location: [place];  Service: General;  Laterality: N/A;  . Laparoscopic diverted colostomy N/A 10/12/2014    Procedure: LAPAROSCOPIC DIVERTING COLOSTOMY;  Surgeon: Donnie Mesa, MD;  Location: [place];  Service: General;  Laterality: N/A;  . Insertion of suprapubic catheter N/A 10/12/2014    Procedure: INSERTION OF SUPRAPUBIC CATHETER;  Surgeon: Reece Packer, MD;  Location: [place];  Service: Urology;  Laterality: N/A;  . Incision and drainage of wound N/A 11/12/2014    Procedure: IRRIGATION AND DEBRIDEMENT OF WOUNDS WITH BONE BIOPSY AND SURGICAL PREP ;  Surgeon: Theodoro Kos, DO;  Location: [place];  Service: Plastics;  Laterality: N/A;  . Application of a-cell of back N/A 11/12/2014    Procedure: APPLICATION A CELL AND VAC ;  Surgeon: Theodoro Kos, DO;  Location: [place];  Service: Plastics;  Laterality: N/A;  . Incision and drainage of wound N/A 11/18/2014    Procedure: IRRIGATION AND DEBRIDEMENT OF SACRAL ULCER ONLY WITH PLACEMENT OF A CELL AND VAC/ DRESSING CHANGE TO UPPER BACK AREA.;  Surgeon: Theodoro Kos,  DO;  Location: [place];  Service: Plastics;  Laterality: N/A;  . Incision and drainage of wound N/A 11/25/2014    Procedure: IRRIGATION AND DEBRIDEMENT OF SACRAL ULCER AND BACK BURN WITH PLACEMENT OF A-CELL;  Surgeon: Theodoro Kos, DO;  Location: [place];  Service: Plastics;  Laterality: N/A;  . Minor application of wound vac N/A 11/25/2014    Procedure:  WOUND VAC CHANGE;  Surgeon: Theodoro Kos, DO;  Location: [place];  Service: Plastics;  Laterality: N/A;  . Incision and drainage of wound Right 12/08/2014    Procedure: IRRIGATION AND  DEBRIDEMENT SACRAL WOUND AND RIGHT ISCHIAL WOUND ;  Surgeon: Theodoro Kos, DO;  Location: [place];  Service: Plastics;  Laterality: Right;  . Application of a-cell of back N/A 12/08/2014    Procedure: APPLICATION OF A-CELL AND WOUND VAC ;  Surgeon: Theodoro Kos, DO;  Location: [name];  Service: Plastics;  Laterality: N/A;   History   Social History  . Marital Status: Single    Spouse Name: N/A  . Number of Children: 0  . Years of Education: 11   Occupational History  . Disability    Social History Main Topics  . Smoking status: Former Smoker -- 1.00 packs/day for 12 years    Types: Cigars, Cigarettes    Quit date: 09/07/2014  . Smokeless tobacco: Never Used  . Alcohol Use: No  . Drug Use: No  . Sexual Activity: Not Currently   Other Topics Concern  . None   Social History Narrative   Patient lives at home with mother father.    Patient has 2 children.    Patient is single.    Patient was left hand but after stroke patient is now right handed.    Patient has  11th grade education.    Family History  Problem Relation Age of Onset  . Diabetes Father   . Hypertension Father   . Asthma      fhx  . Hypertension      fhx  . Stroke      fhx  . Heart disease Mother    Allergies  Allergen Reactions  . Cefuroxime Axetil Anaphylaxis  . Ertapenem Other (See Comments)    Rash and confusion  . Morphine And Related Other (See Comments)    Changed mental status, confusion, headache, visual hallucination  . Penicillins Anaphylaxis and Other (See Comments)    ?can take amoxicillin?  . Sulfa Antibiotics Anaphylaxis, Shortness Of Breath and Other (See Comments)  . Tessalon [Benzonatate] Anaphylaxis  . Shellfish Allergy Itching and Other (See Comments)    Took benadryl to alleviate reaction  . Miripirium Rash and Other (See Comments)    Change in mental status   Prior to Admission medications   Medication Sig Start Date End Date Taking? Authorizing Provider  acetaminophen  (TYLENOL) 325 MG tablet Take 2 tablets (650 mg total) by mouth every 6 (six) hours as needed for mild pain, moderate pain, fever or headache. 01/14/15  Yes Geradine Girt, DO  albuterol (PROVENTIL) (2.5 MG/3ML) 0.083% nebulizer solution Take 3 mLs (2.5 mg total) by nebulization every 4 (four) hours as needed for wheezing or shortness of breath. 04/27/14  Yes [name], MD  apixaban (ELIQUIS) 5 MG TABS tablet Take 1 tablet (5 mg total) by mouth 2 (two) times daily. 04/14/15  Yes [name], MD  B Complex-Folic Acid (B COMPLEX-VITAMIN B12 PO) Take 1 tablet by mouth at bedtime.   Yes Historical Provider, MD  baclofen (LIORESAL) 10 MG tablet Take 1-2 tablets (10-20 mg total) by mouth 3 (three) times daily. Takes 10mg  in morning and lunchtime and takes 20mg  at bedtime 11/29/14  Yes Meredith Staggers, MD  collagenase (SANTYL) ointment Apply 1 application topically daily. 08/30/14  Yes [name], MD  diclofenac sodium (VOLTAREN) 1 % GEL Apply 2 g topically daily as needed (for pain). 11/19/14  Yes Shanker Kristeen Mans, MD  feeding supplement, GLUCERNA SHAKE, (GLUCERNA SHAKE) LIQD Take 237 mLs by mouth 3 (three) times daily between meals. 12/10/14  Yes Florencia Reasons, MD  fentaNYL (DURAGESIC - DOSED MCG/HR) 25 MCG/HR patch Place 1 patch (25 mcg total) onto the skin every 3 (three) days. 04/25/15  Yes Bayard Hugger, NP  glucagon (GLUCAGON EMERGENCY) 1 MG injection Inject 1 mg into the vein once as needed. 04/20/14  Yes [name], MD  insulin aspart (NOVOLOG) 100 UNIT/ML injection Inject 5 Units into the skin 3 (three) times daily with meals. 01/14/15  Yes Jessica U Vann, DO  insulin glargine (LANTUS) 100 UNIT/ML injection Inject 0.3 mLs (30 Units total) into the skin at bedtime. Patient taking differently: Inject 20 Units into the skin at bedtime.  03/05/15  Yes Velvet Bathe, MD  loratadine (CLARITIN) 10 MG tablet Take 10 mg by mouth at bedtime.    Yes Historical Provider, MD  metoCLOPramide (REGLAN) 5 MG tablet Give 3  doses a week Patient taking differently: Take 5 mg by mouth 2 (two) times a week.  04/08/15  Yes [name], MD  midodrine (PROAMATINE) 10 MG tablet Take 1 tablet (10 mg total) by mouth 3 (three) times daily with meals. 04/04/15  Yes [name], MD  mirtazapine (REMERON) 7.5 MG tablet Take 1 tablet (7.5 mg total) by mouth at bedtime. 04/04/15  Yes [name], MD  Multiple Vitamin (MULTIVITAMIN WITH MINERALS) TABS tablet Take 1 tablet by mouth daily.   Yes Historical Provider, MD  mupirocin ointment (BACTROBAN) 2 % 1 application QD 9/48/54  Yes Historical Provider, MD  omeprazole (PRILOSEC) 40 MG capsule Take 40 mg by mouth at bedtime.    Yes Historical Provider, MD  ondansetron (ZOFRAN ODT) 8 MG disintegrating tablet Take 1 tablet (8 mg total) by mouth every 8 (eight) hours as needed for nausea or vomiting. 12/29/14  Yes [name], MD  Oxycodone HCl 10 MG TABS Take 1 tablet (10 mg total) by mouth every 6 (six) hours as needed. Patient taking differently: Take 10-20 mg by mouth every 6 (six) hours as needed (pain). Pt can have 2 tablets when he goes to the wound center for dressing change 04/25/15  Yes Bayard Hugger, NP  pregabalin (LYRICA) 100 MG capsule Take 1 capsule (100 mg total) by mouth 3 (three) times daily. Take 100 mg by mouth in the morning, take 100 mg by mouth in the afternoon, take 100 mg by mouth in the evening and take 200 mg by mouth at bedtime. 03/05/15  Yes Velvet Bathe, MD  metroNIDAZOLE (FLAGYL) 500 MG tablet Take 1 tablet (500 mg total) by mouth every 8 (eight) hours. Patient not taking: Reported on 04/25/2015 03/05/15   Velvet Bathe, MD  nitrofurantoin, macrocrystal-monohydrate, (MACROBID) 100 MG capsule Take 1 capsule (100 mg total) by mouth 2 (two) times daily. Patient not taking: Reported on 04/25/2015 04/01/15   Montine Circle, PA-C  Vancomycin (VANCOCIN) 750 MG/150ML SOLN Inject 150 mLs (750 mg total) into the vein every 12 (twelve) hours. Patient not taking:  Reported  on 04/25/2015 03/05/15   Velvet Bathe, MD   No results found.  Positive ROS: All other systems have been reviewed and were otherwise negative with the exception of those mentioned in the HPI and as above.  Labs cbc  Recent Labs  04/29/15 0352 04/30/15 0440  WBC 34.6* 33.6*  HGB 10.2* 8.6*  HCT 32.3* 28.4*  PLT 726* 648*      Recent Labs  04/29/15 0352 04/30/15 0440  NA 134* 135  K 3.8 3.1*  CL 116* 119*  CO2 14* 13*  GLUCOSE 394* 178*  BUN 13 12  CREATININE 0.47* 0.46*  CALCIUM 7.8* 7.5*    Physical Exam: Filed Vitals:   04/30/15 1330  BP: 86/67  Pulse:   Temp:   Resp: 17   General: Alert, no acute distress Cardiovascular: No pedal edema Respiratory: No cyanosis, no use of accessory musculature GI: No organomegaly, abdomen is soft and non-tender, he does have a colostomy present on the left side. Skin: 3 large decubitus sacral ulcers each measuring at least 7 x 5 cm and appeared to be fairly deep that are packed and actively draining.  There is an approximately 2cm diameter wound of the left heel with an escar overlying and there is a fairly foul odor coming from the eschar.   Neurologic: He has grossly abnormal sensory function distally in both lower extremities as well as his upper extremities. 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 Psychiatric:  He seems to be appropriate with me throughout the interaction, although he doesn't seem to be the best historian. His mother is at the bedside and is able to provide more details. Lymphatic: No axillary or groin lymphadenopathy that I can appreciate  MUSCULOSKELETAL:  His left heel does not have significant sensation but he does seem to have good capillary refill. As indicated above he has a foul-smelling eschar. There is some small areas of excoriation of the skin more proximally, the did not appear to be full-thickness.  His posterior sacrum has multiple open wounds as  described above. I don't see any gross purulence, but there is definitely significant serous sanguinous drainage with some degree of chronic contamination.   He does have a bandage in place over the anterior inferior abdomen.  I did change his dressings over the posterior aspect of the sacrum, although I did not remove the packing.  Assessment: R pelvic internal obturator abscess tracking from the right ischial tuberosity decubitus ulcer, chronic invasive osteomyelitis of the sacrum, uncontrolled diabetes, limited mobility due to incomplete quadriplegic  and he also has a substantial left heal ulcer that appears to have full thickness eschar formation with foul odor  Plan: We have discussed this case with both Dr. Zella Richer with general surgery and interventional radiology physician, Dr. Kathlene Cote.  IR feels that the abscess size is very small and the path of access is difficult.  They recommend trial with IV abx and repeat CT to see if the abscess is increasing in size.  Dr. Zella Richer agrees with this recommendation.    The left heel ulcer was concerning with a large 2cm escar overlying and a foul smell being emited. I recommended removal of the eschar at the bedside given the significant foul odor. I appreciate ongoing wound care management by the wound care team and would follow their recommendations.  We will continue to follow, and also coordinate with Dr. Migdalia Dk, this is an extremely complicated case, and may represent  a progressive deterioration without any ultimate success. I'm concerned about his future capacity to heal any of these wounds. If palliative care has not yet been involved, it may be an appropriate fall.  In the meantime I will debride his heel ulcer, and I would continue with medical management for the pelvic abscess.   Preprocedure diagnosis: Left heel escar/ulcer Postprocedure diagnosis: same  Procedure: debridement of L heel escar/ulcer, measuring 2 x 2 cm heel ulcer     An approximately 2cmX2cm escar on the L heel was debrided at the bedside.  Healthy, bleeding tissue was exposed.  The wound was then redressed.  The patient tolerated the procedure well.  underneath the black eschar there was a layer of gray foul smelling she necrotic tissue that was excised. I used a scissors and a pickup and then dressed the wounds with sterile gauze. I removed approximately 3 or 4 mm in depth of the heel pad, getting back to good tissue. There was no exposed bone.   Kelly,Brittney Lelan Pons, PA-C Cell [phone]  I have seen, examined, and performed the procedure above.  Marchia Bond, M.D.  04/30/2015 2:08 PM

## 2015-04-30 NOTE — Progress Notes (Signed)
[facility] PROGRESS NOTE  [name]:[id number] DOB: 08-26-1986 DOA: 04/25/2015 PCP: [name], MD INTERIM SUMMARY: 29 yo M with hx of DM1 and spinal infarct resulting in quadriplegia s/p colostomy,s/p suprapubic catheter, decubitus ulcer, and pelvic osteomyelitis, presents on 7/18 from PCP office for hypotension. He was admitted for further evaluation of hypotension.   Assessment/Plan: 1. CXR findings of bibasilar infiltrate concerning for atypical pneumonia, without any symptomatology of pneumonia.  Started on on IV vancomycin and IV azactum. Blood cultures from 7/18 showed MRSA  Sensitive to vancomycin and tetracyclines.  Afebrile overnight, repeat blood cultures pending and negative so far.  Appreciate infectious disease Dr Algis Downs input.     2. MRSA bacteremia:  On IV vancomycin.  Requested cardiology for TEE. Initially scheduled for TEE today, but since he was on levophed today, it could not be done.   Sacral decubitus ulcer/ left internal obturator abscess: Wound care following.  MRI of the sacrum, pelvis and left foot ordered as per recommendations of ID. Results shows chronic osteomyelitis and right internal obturator abscess extending fromt he right ischial tuberosity decubitus ulcer. No osteomyelitis of the left foot heel.  Surgery consulted and recommendations given. He underwent bedside debridement.  Orthopedics  consulted for further recommendations, recommended IR consult for aspiration of the abscess.     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  He resides at home and mother is the caregiver.    UTI ? Vs colonization: - unfortunately urine cultures could not be obtained. .   Type 1 DM: CBG (last 3)   Recent Labs  04/29/15 1941 04/30/15 0823 04/30/15 1227  GLUCAP 117* 160* 161*   cbg's are improving. His hemoglobin a1c is 8.3, started him on small dose of lantus and SSI.     Code Status: FULL CODE.  Family Communication: mother at  bedside Disposition Plan: pending further work up.    Consultants:  INFECTIOUS DISEASE.   ORTHOPEDICS  SURGERY  Procedures:  MRI of the pelvis / sacrum  MRI left foot  TEE  Antibiotics:  Vancomycin   Aztreonam.  HPI/Subjective: Sleeping.   Objective: Filed Vitals:   04/30/15 1330  BP: 86/67  Pulse:   Temp:   Resp: 17    Intake/Output Summary (Last 24 hours) at 04/30/15 1344 Last data filed at 04/30/15 1137  Gross per 24 hour  Intake 4985.01 ml  Output   1550 ml  Net 3435.01 ml   Filed Weights   04/25/15 1930 04/29/15 0545 04/30/15 0400  Weight: 43.7 kg (96 lb 5.5 oz) 59.4 kg (130 lb 15.3 oz) 61.1 kg (134 lb 11.2 oz)    Exam:   General:  Lethargic,but answering questions appropriately  Cardiovascular: s1s2  Respiratory: diminished at bases, no wheezing or rhonchi  Abdomen: soft non tender non distended bowel sounds heard  Musculoskeletal: no edema , left foot ulcer on heel.   Data Reviewed: Basic Metabolic Panel:  Recent Labs Lab 04/25/15 1612 04/26/15 0400 04/27/15 0815 04/28/15 0640 04/29/15 0352 04/30/15 0440  NA 130* 134* 137 136 134* 135  K 4.7 3.9 3.4* 3.5 3.8 3.1*  CL 105 112* 117* 117* 116* 119*  CO2 20* 17* 17* 15* 14* 13*  GLUCOSE 116* 199* 77 155* 394* 178*  BUN 20 21* 14 11 13 12   CREATININE 0.68 0.52* 0.40* 0.42* 0.47* 0.46*  CALCIUM 10.2 8.8* 8.8* 8.2* 7.8* 7.5*  MG 1.5*  --   --   --   --   --    Liver Function  Tests:  Recent Labs Lab 04/25/15 1612 04/26/15 0400 04/27/15 0815 04/28/15 0640  AST 12* 10* 12* 8*  ALT 7* 7* 7* 7*  ALKPHOS 160* 133* 206* 173*  BILITOT 0.3 0.6 0.5 0.4  PROT 7.3 6.0* 6.0* 5.3*  ALBUMIN 1.7* 1.3* 1.2* 1.1*   No results for input(s): LIPASE, AMYLASE in the last 168 hours. No results for input(s): AMMONIA in the last 168 hours. CBC:  Recent Labs Lab 04/26/15 0400 04/27/15 0815 04/28/15 0640 04/29/15 0352 04/30/15 0440  WBC 35.2* 29.1* 31.1* 34.6* 33.6*  NEUTROABS  --    --   --  29.5* 27.6*  HGB 6.9* 9.7* 9.3* 10.2* 8.6*  HCT 22.5* 30.6* 28.9* 32.3* 28.4*  MCV 79.8 79.9 79.0 80.1 80.0  PLT 773* 755* 625* 726* 648*   Cardiac Enzymes: No results for input(s): CKTOTAL, CKMB, CKMBINDEX, TROPONINI in the last 168 hours. BNP (last 3 results) No results for input(s): BNP in the last 8760 hours.  ProBNP (last 3 results) No results for input(s): PROBNP in the last 8760 hours.  CBG:  Recent Labs Lab 04/29/15 1228 04/29/15 1603 04/29/15 1941 04/30/15 0823 04/30/15 1227  GLUCAP 262* 120* 117* 160* 161*    Recent Results (from the past 240 hour(s))  Urine culture     Status: None   Collection Time: 04/25/15  4:35 PM  Result Value Ref Range Status   Specimen Description URINE, CLEAN CATCH  Final   Special Requests Normal  Final   Culture   Final    >=100,000 COLONIES/mL ESCHERICHIA COLI Performed at [facility]    Report Status 04/28/2015 FINAL  Final   Organism ID, Bacteria ESCHERICHIA COLI  Final      Susceptibility   Escherichia coli - MIC*    AMPICILLIN <=2 SENSITIVE Sensitive     CEFAZOLIN <=4 SENSITIVE Sensitive     CEFTRIAXONE <=1 SENSITIVE Sensitive     CIPROFLOXACIN >=4 RESISTANT Resistant     GENTAMICIN <=1 SENSITIVE Sensitive     IMIPENEM <=0.25 SENSITIVE Sensitive     NITROFURANTOIN <=16 SENSITIVE Sensitive     TRIMETH/SULFA <=20 SENSITIVE Sensitive     AMPICILLIN/SULBACTAM <=2 SENSITIVE Sensitive     PIP/TAZO <=4 SENSITIVE Sensitive     * >=100,000 COLONIES/mL ESCHERICHIA COLI  Blood culture (routine x 2)     Status: None   Collection Time: 04/25/15  5:00 PM  Result Value Ref Range Status   Specimen Description BLOOD RIGHT ANTECUBITAL  Final   Special Requests BOTTLES DRAWN AEROBIC AND ANAEROBIC 5 CC EA  Final   Culture  Setup Time   Final    GRAM POSITIVE COCCI IN CLUSTERS Gram Stain Report Called to,Read Back By and Verified With: Elveria Rising RN 17:30 04/26/15 ([email or web address]) IN BOTH AEROBIC AND ANAEROBIC BOTTLES     Culture   Final    METHICILLIN RESISTANT STAPHYLOCOCCUS AUREUS MICROAEROPHILIC STREPTOCOCCI Standardized susceptibility testing for this organism is not available. Performed at [facility]    Report Status 04/30/2015 FINAL  Final   Organism ID, Bacteria METHICILLIN RESISTANT STAPHYLOCOCCUS AUREUS  Final      Susceptibility   Methicillin resistant staphylococcus aureus - MIC*    CIPROFLOXACIN >=8 RESISTANT Resistant     ERYTHROMYCIN >=8 RESISTANT Resistant     GENTAMICIN <=0.5 SENSITIVE Sensitive     OXACILLIN >=4 RESISTANT Resistant     TETRACYCLINE <=1 SENSITIVE Sensitive     VANCOMYCIN <=0.5 SENSITIVE Sensitive     TRIMETH/SULFA >=320 RESISTANT Resistant  CLINDAMYCIN >=8 RESISTANT Resistant     RIFAMPIN <=0.5 SENSITIVE Sensitive     Inducible Clindamycin NEGATIVE Sensitive     * METHICILLIN RESISTANT STAPHYLOCOCCUS AUREUS  Blood culture (routine x 2)     Status: None   Collection Time: 04/25/15  5:16 PM  Result Value Ref Range Status   Specimen Description BLOOD BLOOD RIGHT FOREARM  Final   Special Requests BOTTLES DRAWN AEROBIC AND ANAEROBIC 5 CC EA  Final   Culture  Setup Time   Final    GRAM POSITIVE COCCI IN CLUSTERS AEROBIC BOTTLE ONLY CRITICAL RESULT CALLED TO, READ BACK BY AND VERIFIED WITH: S GRAHAM 04/26/15 @ 4 M VESTAL    Culture   Final    STAPHYLOCOCCUS AUREUS SUSCEPTIBILITIES PERFORMED ON PREVIOUS CULTURE WITHIN THE LAST 5 DAYS. Performed at [facility]    Report Status 04/28/2015 FINAL  Final  MRSA PCR Screening     Status: Abnormal   Collection Time: 04/25/15  6:55 PM  Result Value Ref Range Status   MRSA by PCR POSITIVE (A) NEGATIVE Final    Comment:        The GeneXpert MRSA Assay (FDA approved for NASAL specimens only), is one component of a comprehensive MRSA colonization surveillance program. It is not intended to diagnose MRSA infection nor to guide or monitor treatment for MRSA infections. RESULT CALLED TO, READ BACK BY  AND VERIFIED WITH: Karlene Einstein [id number] @ 2043 BY J SCOTTON   Culture, blood (routine x 2)     Status: None (Preliminary result)   Collection Time: 04/28/15  1:20 PM  Result Value Ref Range Status   Specimen Description BLOOD LEFT ARM  Final   Special Requests AEROCOCCUS SPECIES 1CC  Final   Culture   Final    NO GROWTH 2 DAYS Performed at [facility]    Report Status PENDING  Incomplete  Culture, blood (routine x 2)     Status: None (Preliminary result)   Collection Time: 04/28/15  5:50 PM  Result Value Ref Range Status   Specimen Description BLOOD PICC LINE  Final   Special Requests BOTTLES DRAWN AEROBIC AND ANAEROBIC 5CC EACH  Final   Culture   Final    NO GROWTH 2 DAYS Performed at [facility]    Report Status PENDING  Incomplete     Studies: No results found.  Scheduled Meds: . apixaban  5 mg Oral BID  . aztreonam  1 g Intravenous Q8H  . baclofen  10 mg Oral BID WC  . baclofen  20 mg Oral QHS  . Chlorhexidine Gluconate Cloth  6 each Topical Q0600  . feeding supplement (GLUCERNA SHAKE)  237 mL Oral TID BM  . feeding supplement (PRO-STAT SUGAR FREE 64)  30 mL Oral TID BM  . fentaNYL  25 mcg Transdermal Q72H  . insulin aspart  0-15 Units Subcutaneous TID WC  . insulin glargine  5 Units Subcutaneous Daily  . loratadine  10 mg Oral QHS  . midodrine  20 mg Oral TID WC  . mirtazapine  7.5 mg Oral QHS  . mupirocin ointment  1 application Nasal BID  . potassium chloride  40 mEq Oral BID  . pregabalin  100 mg Oral TID AC  . pregabalin  200 mg Oral QHS  . sodium chloride  10-40 mL Intracatheter Q12H  . vancomycin  750 mg Intravenous Q24H   Continuous Infusions: . sodium chloride 150 mL/hr at 04/30/15 1115  . sodium chloride    .  norepinephrine (LEVOPHED) Adult infusion 1 mcg/min (04/30/15 1135)    Active Problems:   Type 1 diabetes, uncontrolled, with neuropathy   Spinal cord infarction (history of)   Sepsis   Stage IV decubitus ulcer    Urinary tract infectious disease   HCAP (healthcare-associated pneumonia)   Hypotension    Time spent:35 min    Satara Virella  [facility] Pager [phone]  If 7PM-7AM, please contact night-coverage at [email or web address], password [facility] 04/30/2015, 1:44 PM  LOS: 5 days

## 2015-04-30 NOTE — Progress Notes (Addendum)
[facility] notified regarding left heel continuing to bleed after today's debridement by orthopedics and dose of Eliquis due at 2200. Belenda Cruise [employer] social research officer responded and thinks giving Eliquis dose will not affect bleeding. Orthopedics PA agreed and aware of bleeding and will reassess in AM with Dr. Mardelle Matte. RN will continue to monitor.   Dr. Mardelle Matte called RN to check on heel bleeding. Silver Nitrate sticks ordered and ace wrap compression dressing applied.

## 2015-04-30 NOTE — Progress Notes (Signed)
Back wound is clean  Bilateral ischial wounds and sacral wound have some necrotic tissue that was sharply debrided, 2 sq cms in all.  Should be able to have wound VAC placed on Monday.  Awaiting Ortho opinion on chronic osteo with adjacent obturator abscess. Wound ask them to look at heal wound as well.  1.  Progress note or procedure note with a detailed description of the procedure.  2.  Tool used for debridement (curette, scapel, etc.)  scapel  3.  Frequency of surgical debridement.   today  4.  Measurement of total devitalized tissue (wound surface) before and after surgical debridement.   2 sq cm before, .5 sq cm after  5.  Area and depth of devitalized tissue removed from wound.  2cm by 2 cm  6.  Blood loss and description of tissue removed.  Necrotic tissue; minimal blood loss  7.  Evidence of the progress of the wound's response to treatment.  A.  Current wound volume (current dimensions and depth).  10 cm by 14 cm by 4 cm total  B.  Presence (and extent of) of infection.  minimal  C.  Presence (and extent of) of non viable tissue.  Minimal after debridement  D.  Other material in the wound that is expected to inhibit healing.  none  8.  Was there any viable tissue removed (measurements): no

## 2015-04-30 NOTE — Progress Notes (Addendum)
I have reviewed the MRI scan of the pelvis and spoken with Dr. Barkley Bruns regarding the options. The abscess is on the internal aspect of the pelvis, and this is not a location where I have ever operated in. Also, the abscess appears to be relatively small, and may be amenable to IV antibiotics management, versus percutaneous drain placement by interventional radiology. This was based on my discussion with Dr. Barkley Bruns.  I think that the best course of action would be interventional radiology percutaneous drain placement, and possible repeat imaging after extended IV antibiotics. It would be valuable also to get Dr. Leafy Ro input, as she has operated on him before apparently, I will plan to do a full consult later today.  Johnny Bridge, MD  Addendum: I spoke with interventional radiology, Dr.Yamagata, who reviewed the MRI, and indicated that the size of the abscess was fairly small, and access to this location may be significantly challenging and there may not be great windows available, and felt that it may be more prudent to continue treatment with IV antibiotics at least for the next couple of days, consider a repeat CT scan of the pelvis with contrast in a couple of days to evaluate if the fluid collection is getting larger, if so then interventional radiology may be of some benefit. If not, then ongoing medical management may be most prudent.  I have canceled his nothing by mouth order, and I would not recommend surgical intervention at this time, nor does it even appear big enough for interventional radiology to intervene, given the limited access and risk.  Johnny Bridge, MD

## 2015-04-30 NOTE — Progress Notes (Signed)
Patient ID: Jason [name], male   DOB: 01-10-1986, 29 y.o.   MRN: [id number]  Asked by Dr. Mardelle Matte to review patient's MRI for possible percutaneous abscess drainage.  Collection in right obturator muscle is quite small, roughly 2 cm.  Only approach would be from right transgluteal approach.  Recommend follow up CT with IV contrast in few days to reassess collection.  If larger despite antibiotic therapy, would likely benefit from percutaneous drainage under CT guidance.  Venetia Night. Kathlene Cote, M.D Pager:  [phone]

## 2015-05-01 DIAGNOSIS — A419 Sepsis, unspecified organism: Secondary | ICD-10-CM

## 2015-05-01 DIAGNOSIS — R6521 Severe sepsis with septic shock: Secondary | ICD-10-CM

## 2015-05-01 LAB — CBC WITH DIFFERENTIAL/PLATELET
BASOS ABS: 0 10*3/uL (ref 0.0–0.1)
BASOS PCT: 0 % (ref 0–1)
EOS ABS: 0.5 10*3/uL (ref 0.0–0.7)
Eosinophils Relative: 2 % (ref 0–5)
HCT: 25.9 % — ABNORMAL LOW (ref 39.0–52.0)
HEMOGLOBIN: 8.2 g/dL — AB (ref 13.0–17.0)
LYMPHS PCT: 14 % (ref 12–46)
Lymphs Abs: 3.6 10*3/uL (ref 0.7–4.0)
MCH: 25.7 pg — AB (ref 26.0–34.0)
MCHC: 31.7 g/dL (ref 30.0–36.0)
MCV: 81.2 fL (ref 78.0–100.0)
MONO ABS: 2.1 10*3/uL — AB (ref 0.1–1.0)
Monocytes Relative: 8 % (ref 3–12)
NEUTROS ABS: 19.8 10*3/uL — AB (ref 1.7–7.7)
NEUTROS PCT: 76 % (ref 43–77)
Platelets: 600 10*3/uL — ABNORMAL HIGH (ref 150–400)
RBC: 3.19 MIL/uL — ABNORMAL LOW (ref 4.22–5.81)
RDW: 16.5 % — AB (ref 11.5–15.5)
WBC: 26 10*3/uL — ABNORMAL HIGH (ref 4.0–10.5)

## 2015-05-01 LAB — VANCOMYCIN, TROUGH: Vancomycin Tr: 16 ug/mL (ref 10.0–20.0)

## 2015-05-01 LAB — GLUCOSE, CAPILLARY
GLUCOSE-CAPILLARY: 58 mg/dL — AB (ref 65–99)
GLUCOSE-CAPILLARY: 152 mg/dL — AB (ref 65–99)
Glucose-Capillary: 177 mg/dL — ABNORMAL HIGH (ref 65–99)
Glucose-Capillary: 128 mg/dL — ABNORMAL HIGH (ref 65–99)
Glucose-Capillary: 130 mg/dL — ABNORMAL HIGH (ref 65–99)
Glucose-Capillary: 171 mg/dL — ABNORMAL HIGH (ref 65–99)

## 2015-05-01 LAB — BASIC METABOLIC PANEL
Anion gap: 2 — ABNORMAL LOW (ref 5–15)
BUN: 12 mg/dL (ref 6–20)
CO2: 14 mmol/L — ABNORMAL LOW (ref 22–32)
Calcium: 7.3 mg/dL — ABNORMAL LOW (ref 8.9–10.3)
Chloride: 122 mmol/L — ABNORMAL HIGH (ref 101–111)
Creatinine, Ser: 0.52 mg/dL — ABNORMAL LOW (ref 0.61–1.24)
GFR calc Af Amer: >60 mL/min (ref >60)
Glucose, Bld: 102 mg/dL — ABNORMAL HIGH (ref 65–99)
POTASSIUM: 3.7 mmol/L (ref 3.5–5.1)
Sodium: 138 mmol/L (ref 135–145)

## 2015-05-01 LAB — CORTISOL: Cortisol, Plasma: 10.6 ug/dL

## 2015-05-01 MED ORDER — SODIUM BICARBONATE 8.4 % IV SOLN
INTRAVENOUS | Status: DC
  Administered 2015-05-01 – 2015-05-04 (×4): via INTRAVENOUS
  Filled 2015-05-01 (×10): qty 50

## 2015-05-01 MED ORDER — HYDROCORTISONE NA SUCCINATE PF 100 MG IJ SOLR
50.0000 mg | Freq: Four times a day (QID) | INTRAMUSCULAR | Status: DC
  Administered 2015-05-01 – 2015-05-02 (×4): 50 mg via INTRAVENOUS
  Filled 2015-05-01 (×4): qty 2

## 2015-05-01 MED ORDER — FENTANYL 50 MCG/HR TD PT72
50.0000 ug | MEDICATED_PATCH | TRANSDERMAL | Status: DC
  Administered 2015-05-04 – 2015-05-07 (×2): 50 ug via TRANSDERMAL
  Filled 2015-05-01 (×2): qty 1

## 2015-05-01 MED ORDER — SODIUM CHLORIDE 0.9 % IV SOLN
INTRAVENOUS | Status: DC | PRN
  Administered 2015-05-02 – 2015-05-07 (×2): via INTRA_ARTERIAL

## 2015-05-01 MED ORDER — SILVER NITRATE-POT NITRATE 75-25 % EX MISC
1.0000 | Freq: Once | CUTANEOUS | Status: AC
  Administered 2015-05-01: 1 via TOPICAL
  Filled 2015-05-01: qty 1

## 2015-05-01 NOTE — Progress Notes (Signed)
Main complaint is chronic pain.  Healed wound debrided by Ortho.  2 cm right obturator abscess not easily accessible.  With consult IR to see if it can be percutaneously aspirated.

## 2015-05-01 NOTE — Progress Notes (Signed)
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 He was admitted for further evaluation of sepsis. His blood cultures revealed MRSA and has been on vancomycin. ID on board.  MRi of the pelvis revealed a small RIGHT internal obturator abscess and plan to undergo CT guided aspiration by IR.   Assessment/Plan: 1. CXR findings of bibasilar infiltrate concerning for atypical pneumonia, without any symptomatology of pneumonia.  Started on on IV vancomycin and IV azactum. Blood cultures from 7/18 showed MRSA  Sensitive to vancomycin and tetracyclines.  Afebrile overnight, repeat blood cultures pending and negative so far.  Appreciate infectious disease Dr Algis Downs input.  Leukocytosis improving.     2. MRSA bacteremia:  On IV vancomycin.  Requested cardiology for TEE. Initially scheduled for TEE ON 7/22, but since he was on levophed , it could not be done.   Sacral decubitus ulcer/ left internal obturator abscess: Wound care following.  MRI of the sacrum, pelvis and left foot ordered as per recommendations of ID. Results shows chronic osteomyelitis and right internal obturator abscess extending fromt he right ischial tuberosity decubitus ulcer. No osteomyelitis of the left foot heel.  Surgery consulted and recommendations given. He underwent bedside debridement.  Orthopedics  consulted for further recommendations, recommended IR consult for aspiration of the abscess.     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  He resides at home and mother is the caregiver.    UTI ? Vs colonization: -urine cultures show E coli.  Type 1 DM: CBG (last 3)   Recent Labs  04/30/15 1227 04/30/15 1655 04/30/15 2133  GLUCAP  161* 155* 128*   cbg's are improving. His hemoglobin a1c is 8.3, started him on small dose of lantus and SSI.   Chronic hypotension:  On midodrine 20 mg TID,but has required levophed on and off for the last 4 days. Requesting PCCM input for recommendations.  A line ordered .    Thrombocytosis: Probably reactive.    Non anion gap metabolic acidosis: Will start him on bicarbonate drip.   Hypokalemia repleted as needed.     Code Status: FULL CODE.  Family Communication: mother at bedside Disposition Plan: pending further work up.    Consultants:  INFECTIOUS DISEASE.   ORTHOPEDICS  SURGERY  Procedures:  MRI of the pelvis / sacrum  MRI left foot  TEE  Antibiotics:  Vancomycin   Aztreonam.  HPI/Subjective: Sleeping.   Objective: Filed Vitals:   05/01/15 1730  BP:   Pulse:   Temp:   Resp: 12    Intake/Output Summary (Last 24 hours) at 05/01/15 1830 Last data filed at 05/01/15 1700  Gross per 24 hour  Intake 5067.23 ml  Output   3375 ml  Net 1692.23 ml   Filed Weights   04/29/15 0545 04/30/15 0400 05/01/15 0427  Weight: 59.4 kg (130 lb 15.3 oz) 61.1 kg (134 lb 11.2 oz) 63.4 kg (139 lb 12.4 oz)    Exam:   General:  Lethargic,but answering questions appropriately, reports pain is well controlled.   Cardiovascular: s1s2  Respiratory: diminished at bases, no wheezing or rhonchi  Abdomen: soft non tender non distended bowel sounds heard  Musculoskeletal: no edema , left foot ulcer on heel.   Data Reviewed: Basic Metabolic Panel:  Recent Labs Lab 04/25/15 1612  04/27/15 0815 04/28/15 0640 04/29/15 0352 04/30/15 0440 05/01/15 0410  NA 130*  < > 137 136 134* 135 138  K 4.7  < > 3.4* 3.5 3.8 3.1* 3.7  CL 105  < > 117* 117* 116* 119* 122*  CO2 20*  < > 17* 15* 14* 13* 14*  GLUCOSE 116*  < > 77 155* 394* 178* 102*  BUN 20  < > 14 11 13 12 12   CREATININE 0.68  < > 0.40* 0.42* 0.47* 0.46* 0.52*  CALCIUM 10.2  < > 8.8* 8.2* 7.8* 7.5*  7.3*  MG 1.5*  --   --   --   --  1.1*  --   < > = values in this interval not displayed. Liver Function Tests:  Recent Labs Lab 04/25/15 1612 04/26/15 0400 04/27/15 0815 04/28/15 0640  AST 12* 10* 12* 8*  ALT 7* 7* 7* 7*  ALKPHOS 160* 133* 206* 173*  BILITOT 0.3 0.6 0.5 0.4  PROT 7.3 6.0* 6.0* 5.3*  ALBUMIN 1.7* 1.3* 1.2* 1.1*   No results for input(s): LIPASE, AMYLASE in the last 168 hours. No results for input(s): AMMONIA in the last 168 hours. CBC:  Recent Labs Lab 04/27/15 0815 04/28/15 0640 04/29/15 0352 04/30/15 0440 05/01/15 0410  WBC 29.1* 31.1* 34.6* 33.6* 26.0*  NEUTROABS  --   --  29.5* 27.6* 19.8*  HGB 9.7* 9.3* 10.2* 8.6* 8.2*  HCT 30.6* 28.9* 32.3* 28.4* 25.9*  MCV 79.9 79.0 80.1 80.0 81.2  PLT 755* 625* 726* 648* 600*   Cardiac Enzymes: No results for input(s): CKTOTAL, CKMB, CKMBINDEX, TROPONINI in the last 168 hours. BNP (last 3 results) No results for input(s): BNP in the last 8760 hours.  ProBNP (last 3 results) No results for input(s): PROBNP in the last 8760 hours.  CBG:  Recent Labs Lab 04/29/15 2140 04/30/15 0823 04/30/15 1227 04/30/15 1655 04/30/15 2133  GLUCAP 177* 160* 161* 155* 128*    Recent Results (from the past 240 hour(s))  Urine culture     Status: None   Collection Time: 04/25/15  4:35 PM  Result Value Ref Range Status   Specimen Description URINE, CLEAN CATCH  Final   Special Requests Normal  Final   Culture   Final    >=100,000 COLONIES/mL ESCHERICHIA COLI Performed at [facility]    Report Status 04/28/2015 FINAL  Final   Organism ID, Bacteria ESCHERICHIA COLI  Final      Susceptibility   Escherichia coli - MIC*    AMPICILLIN <=2 SENSITIVE Sensitive     CEFAZOLIN <=4 SENSITIVE Sensitive     CEFTRIAXONE <=1 SENSITIVE Sensitive     CIPROFLOXACIN >=4 RESISTANT Resistant     GENTAMICIN <=1 SENSITIVE Sensitive     IMIPENEM <=0.25 SENSITIVE Sensitive     NITROFURANTOIN <=16 SENSITIVE Sensitive      TRIMETH/SULFA <=20 SENSITIVE Sensitive     AMPICILLIN/SULBACTAM <=2 SENSITIVE Sensitive     PIP/TAZO <=4 SENSITIVE Sensitive     * >=100,000 COLONIES/mL ESCHERICHIA COLI  Blood culture (routine x 2)     Status: None   Collection Time: 04/25/15  5:00 PM  Result Value Ref Range Status   Specimen Description BLOOD RIGHT ANTECUBITAL  Final   Special Requests BOTTLES DRAWN AEROBIC AND ANAEROBIC 5 CC EA  Final   Culture  Setup Time   Final    GRAM POSITIVE COCCI IN CLUSTERS Gram Stain Report Called to,Read Back By and Verified With: Elveria Rising RN 17:30 04/26/15 ([email or web address])  IN BOTH AEROBIC AND ANAEROBIC BOTTLES    Culture   Final    METHICILLIN RESISTANT STAPHYLOCOCCUS AUREUS MICROAEROPHILIC STREPTOCOCCI Standardized susceptibility testing for this organism is not available. Performed at [facility]    Report Status 04/30/2015 FINAL  Final   Organism ID, Bacteria METHICILLIN RESISTANT STAPHYLOCOCCUS AUREUS  Final      Susceptibility   Methicillin resistant staphylococcus aureus - MIC*    CIPROFLOXACIN >=8 RESISTANT Resistant     ERYTHROMYCIN >=8 RESISTANT Resistant     GENTAMICIN <=0.5 SENSITIVE Sensitive     OXACILLIN >=4 RESISTANT Resistant     TETRACYCLINE <=1 SENSITIVE Sensitive     VANCOMYCIN <=0.5 SENSITIVE Sensitive     TRIMETH/SULFA >=320 RESISTANT Resistant     CLINDAMYCIN >=8 RESISTANT Resistant     RIFAMPIN <=0.5 SENSITIVE Sensitive     Inducible Clindamycin NEGATIVE Sensitive     * METHICILLIN RESISTANT STAPHYLOCOCCUS AUREUS  Blood culture (routine x 2)     Status: None (Preliminary result)   Collection Time: 04/25/15  5:16 PM  Result Value Ref Range Status   Specimen Description BLOOD BLOOD RIGHT FOREARM  Final   Special Requests BOTTLES DRAWN AEROBIC AND ANAEROBIC 5 CC EA  Final   Culture  Setup Time   Final    GRAM POSITIVE COCCI IN CLUSTERS CRITICAL RESULT CALLED TO, READ BACK BY AND VERIFIED WITH: S GRAHAM 04/26/15 @ 43 M VESTAL IN BOTH AEROBIC AND ANAEROBIC  BOTTLES    Culture   Final    STAPHYLOCOCCUS AUREUS SUSCEPTIBILITIES PERFORMED ON PREVIOUS CULTURE WITHIN THE LAST 5 DAYS. Performed at [facility]    Report Status PENDING  Incomplete  MRSA PCR Screening     Status: Abnormal   Collection Time: 04/25/15  6:55 PM  Result Value Ref Range Status   MRSA by PCR POSITIVE (A) NEGATIVE Final    Comment:        The GeneXpert MRSA Assay (FDA approved for NASAL specimens only), is one component of a comprehensive MRSA colonization surveillance program. It is not intended to diagnose MRSA infection nor to guide or monitor treatment for MRSA infections. RESULT CALLED TO, READ BACK BY AND VERIFIED WITH: Sharyn Lull REEVES,RN [id number] @ 2043 BY J SCOTTON   Culture, blood (routine x 2)     Status: None (Preliminary result)   Collection Time: 04/28/15  1:20 PM  Result Value Ref Range Status   Specimen Description BLOOD LEFT ARM  Final   Special Requests IN PEDIATRIC BOTTLE 1CC  Final   Culture  Setup Time   Final    GRAM POSITIVE COCCI IN CLUSTERS AEROBIC BOTTLE ONLY CRITICAL RESULT CALLED TO, READ BACK BY AND VERIFIED WITH: Hetty Blend RN [id number] 04/30/15 A BROWNING    Culture   Final    TOO YOUNG TO READ Performed at [facility]    Report Status PENDING  Incomplete  Culture, blood (routine x 2)     Status: None (Preliminary result)   Collection Time: 04/28/15  5:50 PM  Result Value Ref Range Status   Specimen Description BLOOD PICC LINE  Final   Special Requests BOTTLES DRAWN AEROBIC AND ANAEROBIC 5CC EACH  Final   Culture   Final    NO GROWTH 3 DAYS Performed at [facility]    Report Status PENDING  Incomplete     Studies: No results found.  Scheduled Meds: . apixaban  5 mg Oral BID  . aztreonam  1 g Intravenous Q8H  .  baclofen  10 mg Oral BID WC  . baclofen  20 mg Oral QHS  . feeding supplement (GLUCERNA SHAKE)  237 mL Oral TID BM  . feeding supplement (PRO-STAT SUGAR FREE 64)  30 mL Oral TID BM  .  [START ON 05/04/2015] fentaNYL  50 mcg Transdermal Q72H  . hydrocortisone sodium succinate  50 mg Intravenous 4 times per day  . insulin aspart  0-15 Units Subcutaneous TID WC  . insulin glargine  5 Units Subcutaneous Daily  . loratadine  10 mg Oral QHS  . midodrine  20 mg Oral TID WC  . mirtazapine  7.5 mg Oral QHS  . pregabalin  100 mg Oral TID AC  . pregabalin  200 mg Oral QHS  . sodium chloride  10-40 mL Intracatheter Q12H  . vancomycin  750 mg Intravenous Q24H   Continuous Infusions: . sodium chloride 150 mL/hr at 05/01/15 1807  . sodium chloride    . norepinephrine (LEVOPHED) Adult infusion Stopped (05/01/15 1400)    Active Problems:   Type 1 diabetes, uncontrolled, with neuropathy   Spinal cord infarction (history of)   Sepsis   Stage IV decubitus ulcer   Urinary tract infectious disease   HCAP (healthcare-associated pneumonia)   Hypotension    Time spent:35 min    Jason [name]  [facility] Pager [phone]  If 7PM-7AM, please contact night-coverage at [email or web address], password [facility] 05/01/2015, 6:30 PM  LOS: 6 days

## 2015-05-01 NOTE — Progress Notes (Signed)
Brief pharmacy note:  See full antibiotic note from [employer] [facility] from earlier today:  Vanc trough= 16 (therapeutic)  Plan: Continue vancomycin 750mg  IV q24h Follow up renal function, culture results and clinical course  Dolly Rias RPh 05/01/2015, 8:52 PM Pager [phone]

## 2015-05-01 NOTE — Procedures (Signed)
Arterial Catheter Insertion Procedure Note Jason [name] [id number] 1986-05-08  Procedure: Insertion of Arterial Catheter  Indications: Blood pressure monitoring and Frequent blood sampling  Procedure Details Consent: Risks of procedure as well as the alternatives and risks of each were explained to the (patient/caregiver).  Consent for procedure obtained. Time Out: Verified patient identification, verified procedure, site/side was marked, verified correct patient position, special equipment/implants available, medications/allergies/relevent history reviewed, required imaging and test results available.  Performed  Maximum sterile technique was used including antiseptics, cap, gloves, gown, hand hygiene, mask and sheet. Skin prep: Chlorhexidine; local anesthetic administered 20 gauge catheter was inserted into left radial artery using the Seldinger technique.  Evaluation Blood flow good; BP tracing good. Complications: No apparent complications.   Johnette Abraham 05/01/2015

## 2015-05-01 NOTE — Progress Notes (Signed)
ANTIBIOTIC CONSULT NOTE - Follow Up  Pharmacy Consult for Vancomycin, Aztreonam Indication: pneumonia  Allergies  Allergen Reactions  . Cefuroxime Axetil Anaphylaxis  . Ertapenem Other (See Comments)    Rash and confusion  . Morphine And Related Other (See Comments)    Changed mental status, confusion, headache, visual hallucination  . Penicillins Anaphylaxis and Other (See Comments)    ?can take amoxicillin?  . Sulfa Antibiotics Anaphylaxis, Shortness Of Breath and Other (See Comments)  . Tessalon [Benzonatate] Anaphylaxis  . Shellfish Allergy Itching and Other (See Comments)    Took benadryl to alleviate reaction  . Miripirium Rash and Other (See Comments)    Change in mental status    Patient Measurements: Height: 5\' 8"  (172.7 cm) Weight: 139 lb 12.4 oz (63.4 kg) IBW/kg (Calculated) : 68.4  Vital Signs: Temp: 97.6 F (36.4 C) (07/24 0800) Temp Source: Oral (07/24 0800) BP: 101/61 mmHg (07/24 0630) Intake/Output from previous day: 07/23 0701 - 07/24 0700 In: 7276.8 [P.O.:1320; I.V.:5606.8; IV Piggyback:350] Out: 2950 [Urine:1400; MWUXL:2440] Intake/Output from this shift:    Labs:  Recent Labs  04/29/15 0352 04/30/15 0440 05/01/15 0410  WBC 34.6* 33.6* 26.0*  HGB 10.2* 8.6* 8.2*  PLT 726* 648* 600*  CREATININE 0.47* 0.46* 0.52*   Estimated Creatinine Clearance: 121.1 mL/min (by C-G formula based on Cr of 0.52). No results for input(s): VANCOTROUGH, VANCOPEAK, VANCORANDOM, GENTTROUGH, GENTPEAK, GENTRANDOM, TOBRATROUGH, TOBRAPEAK, TOBRARND, AMIKACINPEAK, AMIKACINTROU, AMIKACIN in the last 72 hours.   Microbiology: Recent Results (from the past 720 hour(s))  Urine culture     Status: None   Collection Time: 04/25/15  4:35 PM  Result Value Ref Range Status   Specimen Description URINE, CLEAN CATCH  Final   Special Requests Normal  Final   Culture   Final    >=100,000 COLONIES/mL ESCHERICHIA COLI Performed at [facility]    Report Status  04/28/2015 FINAL  Final   Organism ID, Bacteria ESCHERICHIA COLI  Final      Susceptibility   Escherichia coli - MIC*    AMPICILLIN <=2 SENSITIVE Sensitive     CEFAZOLIN <=4 SENSITIVE Sensitive     CEFTRIAXONE <=1 SENSITIVE Sensitive     CIPROFLOXACIN >=4 RESISTANT Resistant     GENTAMICIN <=1 SENSITIVE Sensitive     IMIPENEM <=0.25 SENSITIVE Sensitive     NITROFURANTOIN <=16 SENSITIVE Sensitive     TRIMETH/SULFA <=20 SENSITIVE Sensitive     AMPICILLIN/SULBACTAM <=2 SENSITIVE Sensitive     PIP/TAZO <=4 SENSITIVE Sensitive     * >=100,000 COLONIES/mL ESCHERICHIA COLI  Blood culture (routine x 2)     Status: None   Collection Time: 04/25/15  5:00 PM  Result Value Ref Range Status   Specimen Description BLOOD RIGHT ANTECUBITAL  Final   Special Requests BOTTLES DRAWN AEROBIC AND ANAEROBIC 5 CC EA  Final   Culture  Setup Time   Final    GRAM POSITIVE COCCI IN CLUSTERS Gram Stain Report Called to,Read Back By and Verified With: Elveria Rising RN 17:30 04/26/15 ([email or web address]) IN BOTH AEROBIC AND ANAEROBIC BOTTLES    Culture   Final    METHICILLIN RESISTANT STAPHYLOCOCCUS AUREUS MICROAEROPHILIC STREPTOCOCCI Standardized susceptibility testing for this organism is not available. Performed at [facility]    Report Status 04/30/2015 FINAL  Final   Organism ID, Bacteria METHICILLIN RESISTANT STAPHYLOCOCCUS AUREUS  Final      Susceptibility   Methicillin resistant staphylococcus aureus - MIC*    CIPROFLOXACIN >=8 RESISTANT Resistant  ERYTHROMYCIN >=8 RESISTANT Resistant     GENTAMICIN <=0.5 SENSITIVE Sensitive     OXACILLIN >=4 RESISTANT Resistant     TETRACYCLINE <=1 SENSITIVE Sensitive     VANCOMYCIN <=0.5 SENSITIVE Sensitive     TRIMETH/SULFA >=320 RESISTANT Resistant     CLINDAMYCIN >=8 RESISTANT Resistant     RIFAMPIN <=0.5 SENSITIVE Sensitive     Inducible Clindamycin NEGATIVE Sensitive     * METHICILLIN RESISTANT STAPHYLOCOCCUS AUREUS  Blood culture (routine x 2)     Status:  None (Preliminary result)   Collection Time: 04/25/15  5:16 PM  Result Value Ref Range Status   Specimen Description BLOOD BLOOD RIGHT FOREARM  Final   Special Requests BOTTLES DRAWN AEROBIC AND ANAEROBIC 5 CC EA  Final   Culture  Setup Time   Final    GRAM POSITIVE COCCI IN CLUSTERS CRITICAL RESULT CALLED TO, READ BACK BY AND VERIFIED WITH: S GRAHAM 04/26/15 @ 20 M VESTAL IN BOTH AEROBIC AND ANAEROBIC BOTTLES    Culture   Final    STAPHYLOCOCCUS AUREUS SUSCEPTIBILITIES PERFORMED ON PREVIOUS CULTURE WITHIN THE LAST 5 DAYS. Performed at [facility]    Report Status PENDING  Incomplete  MRSA PCR Screening     Status: Abnormal   Collection Time: 04/25/15  6:55 PM  Result Value Ref Range Status   MRSA by PCR POSITIVE (A) NEGATIVE Final    Comment:        The GeneXpert MRSA Assay (FDA approved for NASAL specimens only), is one component of a comprehensive MRSA colonization surveillance program. It is not intended to diagnose MRSA infection nor to guide or monitor treatment for MRSA infections. RESULT CALLED TO, READ BACK BY AND VERIFIED WITH: Sharyn Lull REEVES,RN [id number] @ 2043 BY J SCOTTON   Culture, blood (routine x 2)     Status: None (Preliminary result)   Collection Time: 04/28/15  1:20 PM  Result Value Ref Range Status   Specimen Description BLOOD LEFT ARM  Final   Special Requests IN PEDIATRIC BOTTLE 1CC  Final   Culture  Setup Time   Final    GRAM POSITIVE COCCI IN CLUSTERS AEROBIC BOTTLE ONLY CRITICAL RESULT CALLED TO, READ BACK BY AND VERIFIED WITH: Hetty Blend RN [id number] 04/30/15 A BROWNING    Culture   Final    TOO YOUNG TO READ Performed at [facility]    Report Status PENDING  Incomplete  Culture, blood (routine x 2)     Status: None (Preliminary result)   Collection Time: 04/28/15  5:50 PM  Result Value Ref Range Status   Specimen Description BLOOD PICC LINE  Final   Special Requests BOTTLES DRAWN AEROBIC AND ANAEROBIC 5CC EACH  Final   Culture    Final    NO GROWTH 2 DAYS Performed at [facility]    Report Status PENDING  Incomplete    Medications:  Scheduled:  . apixaban  5 mg Oral BID  . aztreonam  1 g Intravenous Q8H  . baclofen  10 mg Oral BID WC  . baclofen  20 mg Oral QHS  . feeding supplement (GLUCERNA SHAKE)  237 mL Oral TID BM  . feeding supplement (PRO-STAT SUGAR FREE 64)  30 mL Oral TID BM  . fentaNYL  25 mcg Transdermal Q72H  . insulin aspart  0-15 Units Subcutaneous TID WC  . insulin glargine  5 Units Subcutaneous Daily  . loratadine  10 mg Oral QHS  . midodrine  20 mg Oral TID  WC  . mirtazapine  7.5 mg Oral QHS  . pregabalin  100 mg Oral TID AC  . pregabalin  200 mg Oral QHS  . sodium chloride  10-40 mL Intracatheter Q12H  . vancomycin  750 mg Intravenous Q24H   Infusions:  . sodium chloride 1,000 mL (04/30/15 1912)  . sodium chloride    . norepinephrine (LEVOPHED) Adult infusion 2 mcg/min (05/01/15 0600)   PRN:   Assessment: 29 yo male with history of a C6 spinal cord injury, wheelchair bound, with multiple medical problems with frequent visits to the ED. He presents from the wound care clinic with hypotension (BP 60/47). CXR shows bibasilar interstitial infiltrate - suspect atypical pneumonia.  EDP has ordered aztreonam x 1 dose and Pharmacy is consulted to dose vancomycin.  7/23:  TTE performed instead of the TEE that was ordered. It could not exclude a vegetation or mass in the R atrium.  7/18 >> Vancomycin >> 7/18 >> Aztreonam >>  7/18 blood: 2 of 2 MRSA 7/18 urine: >100k E.coli (pansensitive except Cipro) - ID asked lab to check for aztreonam sensitivity -> no result as of 7/24 7/18 MRSA PCR: positive 7/21 blood x2: ngtd  Today, 05/01/2015: Tmax: AF since 7/19 PM WBC: remains elevated SCr: <1, CrCl >100 (>100 N, but note LOW weight with severe muscle wasting) Lactic acid: 1.23 (7/18)  Goal of Therapy:  Vancomycin trough level 15-20 mcg/ml  Appropriate antibiotic dosing for  renal function; eradication of infection  Plan:  Day 7 antibiotics Cont Vanc 750mg  IV q24h. Check a Vanc trough before tonight's dose. Continue Aztreonam 1g q8h Follow up renal fxn, culture results, and clinical course.  Romeo Rabon, PharmD, pager [phone]. 05/01/2015,10:43 AM.

## 2015-05-01 NOTE — Plan of Care (Signed)
Problem: Phase III Progression Outcomes Goal: Pain controlled on oral analgesia Outcome: Not Progressing Pt refused PO pain medication between IV doses.

## 2015-05-01 NOTE — Consult Note (Signed)
PULMONARY / CRITICAL CARE MEDICINE   Name: Jason [name] MRN: [id number] DOB: 1986/02/23    ADMISSION DATE:  04/25/2015 CONSULTATION DATE:  05/01/2015  REFERRING MD :  TRH  CHIEF COMPLAINT:  Hypotension  INITIAL PRESENTATION: 29 year old male quad with MRSA bacteremia from multiple sources.  Concern for endocarditis.  Wounds have been checked and followed by general surgery and ortho.  Patient has history of hypotension and concern for septic shock and levophed was started and PCCM consulted.   HISTORY OF PRESENT ILLNESS:  29 year old male quad with MRSA bacteremia from multiple sources.  Concern for endocarditis.  Wounds have been checked and followed by general surgery and ortho.  Patient has history of hypotension and concern for septic shock and levophed was started and PCCM consulted.   PAST MEDICAL HISTORY :   has a past medical history of GERD (gastroesophageal reflux disease); Asthma; MRSA infection; Gastroparesis; Diabetic neuropathy; Seizures; Family history of anesthesia complication; Dysrhythmia; Pneumonia; Arthritis; Fibromyalgia; Stroke (01/29/2014); Type I diabetes mellitus; and Syncope (02/16/2015).  has past surgical history that includes Tonsillectomy; Multiple extractions with alveoloplasty (N/A, 08/03/2014); TEE without cardioversion (N/A, 08/17/2014); Debridment of decubitus ulcer (N/A, 10/04/2014); Laparoscopic diverted colostomy (N/A, 10/12/2014); Insertion of suprapubic catheter (N/A, 10/12/2014); Incision and drainage of wound (N/A, 11/12/2014); Application of a-cell of back (N/A, 11/12/2014); Incision and drainage of wound (N/A, 11/18/2014); Incision and drainage of wound (N/A, 11/25/2014); Minor application of wound vac (N/A, 11/25/2014); Incision and drainage of wound (Right, 5/0/3546); and Application of a-cell of back (N/A, 12/08/2014). Prior to Admission medications   Medication Sig Start Date End Date Taking? Authorizing Provider  acetaminophen (TYLENOL) 325 MG tablet Take 2  tablets (650 mg total) by mouth every 6 (six) hours as needed for mild pain, moderate pain, fever or headache. 01/14/15  Yes Geradine Girt, DO  albuterol (PROVENTIL) (2.5 MG/3ML) 0.083% nebulizer solution Take 3 mLs (2.5 mg total) by nebulization every 4 (four) hours as needed for wheezing or shortness of breath. 04/27/14  Yes [name], MD  apixaban (ELIQUIS) 5 MG TABS tablet Take 1 tablet (5 mg total) by mouth 2 (two) times daily. 04/14/15  Yes [name], MD  B Complex-Folic Acid (B COMPLEX-VITAMIN B12 PO) Take 1 tablet by mouth at bedtime.   Yes Historical Provider, MD  baclofen (LIORESAL) 10 MG tablet Take 1-2 tablets (10-20 mg total) by mouth 3 (three) times daily. Takes 10mg  in morning and lunchtime and takes 20mg  at bedtime 11/29/14  Yes Meredith Staggers, MD  collagenase (SANTYL) ointment Apply 1 application topically daily. 08/30/14  Yes [name], MD  diclofenac sodium (VOLTAREN) 1 % GEL Apply 2 g topically daily as needed (for pain). 11/19/14  Yes Shanker Kristeen Mans, MD  feeding supplement, GLUCERNA SHAKE, (GLUCERNA SHAKE) LIQD Take 237 mLs by mouth 3 (three) times daily between meals. 12/10/14  Yes Florencia Reasons, MD  fentaNYL (DURAGESIC - DOSED MCG/HR) 25 MCG/HR patch Place 1 patch (25 mcg total) onto the skin every 3 (three) days. 04/25/15  Yes Bayard Hugger, NP  glucagon (GLUCAGON EMERGENCY) 1 MG injection Inject 1 mg into the vein once as needed. 04/20/14  Yes [name], MD  insulin aspart (NOVOLOG) 100 UNIT/ML injection Inject 5 Units into the skin 3 (three) times daily with meals. 01/14/15  Yes Jessica U Vann, DO  insulin glargine (LANTUS) 100 UNIT/ML injection Inject 0.3 mLs (30 Units total) into the skin at bedtime. Patient taking differently: Inject 20  Units into the skin at bedtime.  03/05/15  Yes Velvet Bathe, MD  loratadine (CLARITIN) 10 MG tablet Take 10 mg by mouth at bedtime.    Yes Historical Provider, MD  metoCLOPramide (REGLAN) 5 MG tablet Give 3 doses a week Patient taking  differently: Take 5 mg by mouth 2 (two) times a week.  04/08/15  Yes [name], MD  midodrine (PROAMATINE) 10 MG tablet Take 1 tablet (10 mg total) by mouth 3 (three) times daily with meals. 04/04/15  Yes [name], MD  mirtazapine (REMERON) 7.5 MG tablet Take 1 tablet (7.5 mg total) by mouth at bedtime. 04/04/15  Yes [name], MD  Multiple Vitamin (MULTIVITAMIN WITH MINERALS) TABS tablet Take 1 tablet by mouth daily.   Yes Historical Provider, MD  mupirocin ointment (BACTROBAN) 2 % 1 application QD 3/55/73  Yes Historical Provider, MD  omeprazole (PRILOSEC) 40 MG capsule Take 40 mg by mouth at bedtime.    Yes Historical Provider, MD  ondansetron (ZOFRAN ODT) 8 MG disintegrating tablet Take 1 tablet (8 mg total) by mouth every 8 (eight) hours as needed for nausea or vomiting. 12/29/14  Yes [name], MD  Oxycodone HCl 10 MG TABS Take 1 tablet (10 mg total) by mouth every 6 (six) hours as needed. Patient taking differently: Take 10-20 mg by mouth every 6 (six) hours as needed (pain). Pt can have 2 tablets when he goes to the wound center for dressing change 04/25/15  Yes Bayard Hugger, NP  pregabalin (LYRICA) 100 MG capsule Take 1 capsule (100 mg total) by mouth 3 (three) times daily. Take 100 mg by mouth in the morning, take 100 mg by mouth in the afternoon, take 100 mg by mouth in the evening and take 200 mg by mouth at bedtime. 03/05/15  Yes Velvet Bathe, MD  metroNIDAZOLE (FLAGYL) 500 MG tablet Take 1 tablet (500 mg total) by mouth every 8 (eight) hours. Patient not taking: Reported on 04/25/2015 03/05/15   Velvet Bathe, MD  nitrofurantoin, macrocrystal-monohydrate, (MACROBID) 100 MG capsule Take 1 capsule (100 mg total) by mouth 2 (two) times daily. Patient not taking: Reported on 04/25/2015 04/01/15   Montine Circle, PA-C  Vancomycin (VANCOCIN) 750 MG/150ML SOLN Inject 150 mLs (750 mg total) into the vein every 12 (twelve) hours. Patient not taking: Reported on 04/25/2015 03/05/15    Velvet Bathe, MD   Allergies  Allergen Reactions  . Cefuroxime Axetil Anaphylaxis  . Ertapenem Other (See Comments)    Rash and confusion  . Morphine And Related Other (See Comments)    Changed mental status, confusion, headache, visual hallucination  . Penicillins Anaphylaxis and Other (See Comments)    ?can take amoxicillin?  . Sulfa Antibiotics Anaphylaxis, Shortness Of Breath and Other (See Comments)  . Tessalon [Benzonatate] Anaphylaxis  . Shellfish Allergy Itching and Other (See Comments)    Took benadryl to alleviate reaction  . Miripirium Rash and Other (See Comments)    Change in mental status    FAMILY HISTORY:  indicated that his mother is alive. He indicated that his father is alive.  SOCIAL HISTORY:  reports that he quit smoking about 7 months ago. His smoking use included Cigars and Cigarettes. He has a 12 pack-year smoking history. He has never used smokeless tobacco. He reports that he does not drink alcohol or use illicit drugs.  REVIEW OF SYSTEMS:  Unattainable  SUBJECTIVE: feels well.  VITAL SIGNS: Temp:  [96.3 F (35.7 C)-98 F (36.7 C)]  97.6 F (36.4 C) (07/24 0800) Resp:  [8-21] 12 (07/24 1100) BP: (67-138)/(49-99) 124/82 mmHg (07/24 1100) SpO2:  [96 %-100 %] 98 % (07/24 0900) Weight:  [63.4 kg (139 lb 12.4 oz)] 63.4 kg (139 lb 12.4 oz) (07/24 0427) HEMODYNAMICS:   VENTILATOR SETTINGS:   INTAKE / OUTPUT:  Intake/Output Summary (Last 24 hours) at 05/01/15 1216 Last data filed at 05/01/15 1100  Gross per 24 hour  Intake 4242.78 ml  Output   2950 ml  Net 1292.78 ml    PHYSICAL EXAMINATION: General:  Chronically ill appearing contracted male, NAD. Neuro:  Quad but alert and oriented. HEENT:  [place]/AT, PERRL, EOM-I and MMM. Cardiovascular:  RRR, Nl S1/S2, -M/R/G. Lungs:  Coarse BS diffusely. Abdomen:  Soft, NT, ND and +BS. Musculoskeletal:  -edema and -tenderness, contracted. Skin:  Intact.  LABS:  CBC  Recent Labs Lab 04/29/15 0352  04/30/15 0440 05/01/15 0410  WBC 34.6* 33.6* 26.0*  HGB 10.2* 8.6* 8.2*  HCT 32.3* 28.4* 25.9*  PLT 726* 648* 600*   Coag's No results for input(s): APTT, INR in the last 168 hours. BMET  Recent Labs Lab 04/29/15 0352 04/30/15 0440 05/01/15 0410  NA 134* 135 138  K 3.8 3.1* 3.7  CL 116* 119* 122*  CO2 14* 13* 14*  BUN 13 12 12   CREATININE 0.47* 0.46* 0.52*  GLUCOSE 394* 178* 102*   Electrolytes  Recent Labs Lab 04/25/15 1612  04/29/15 0352 04/30/15 0440 05/01/15 0410  CALCIUM 10.2  < > 7.8* 7.5* 7.3*  MG 1.5*  --   --  1.1*  --   < > = values in this interval not displayed. Sepsis Markers  Recent Labs Lab 04/25/15 1635  LATICACIDVEN 1.23   ABG No results for input(s): PHART, PCO2ART, PO2ART in the last 168 hours. Liver Enzymes  Recent Labs Lab 04/26/15 0400 04/27/15 0815 04/28/15 0640  AST 10* 12* 8*  ALT 7* 7* 7*  ALKPHOS 133* 206* 173*  BILITOT 0.6 0.5 0.4  ALBUMIN 1.3* 1.2* 1.1*   Cardiac Enzymes No results for input(s): TROPONINI, PROBNP in the last 168 hours. Glucose  Recent Labs Lab 04/29/15 1941 04/29/15 2140 04/30/15 0823 04/30/15 1227 04/30/15 1655 04/30/15 2133  GLUCAP 117* 177* 160* 161* 155* 128*   Imaging No results found.  ASSESSMENT / PLAN:  PULMONARY Trach, chronic. A: On RA. P:   - O2 as needed. - Monitor for airway protection.  CARDIOVASCULAR PICC line 7/19>>> A: Hypotension chronic in nature.  With contraction the measurement is inaccurate as well. P:  - Place a-line. - Check cortisol level. - Continue norepi but adjust parameter for a MAP of 55 or SBP of 80 since patient is making 3L of fluid daily on current level. - Stress dose steroids, will d/c if levels are low. - Midodrine increase to 20 mg PO TID. - IVF resuscitation.  RENAL A:  UOP adequate.  Hyperchloremia. P:   - Recommend changing IVF to 1/2 NS given hyperchloremia. - BMET in AM. - Replace electrolytes as  indicated.  GASTROINTESTINAL A:  No active issues. P:   - Diet as ordered. - GI prophylaxis.  HEMATOLOGIC A:  Leukocytosis due to infection, improving. P:  - Abx. - CBC in AM. - Transfuse per ICU protocol.  INFECTIOUS A:  MRSA bacteremia.  P:   BCx2 MRSA  Vancomycin 7/19>>> Zosyn 7/19>>>  ENDOCRINE A:  No active issues.   P:   - Monitor.  NEUROLOGIC A:  Quad post trauma. P:   -  Pain management for wounds. - Monitor closely.  FAMILY  - Updates: mother and patient updated bedside.  The patient is critically ill with multiple organ systems failure and requires high complexity decision making for assessment and support, frequent evaluation and titration of therapies, application of advanced monitoring technologies and extensive interpretation of multiple databases.   Critical Care Time devoted to patient care services described in this note is  35  Minutes. This time reflects time of care of this signee Dr Jennet Maduro. This critical care time does not reflect procedure time, or teaching time or supervisory time of PA/NP/Med student/Med Resident etc but could involve care discussion time.  Rush Farmer, M.D. [facility] Pulmonary/Critical Care Medicine. Pager: [phone]. After hours pager: [phone].  05/01/2015, 12:16 PM

## 2015-05-02 ENCOUNTER — Encounter (HOSPITAL_COMMUNITY): Payer: Self-pay | Admitting: Radiology

## 2015-05-02 ENCOUNTER — Inpatient Hospital Stay (HOSPITAL_COMMUNITY): Payer: Medicaid Other

## 2015-05-02 LAB — BASIC METABOLIC PANEL
Anion gap: 1 — ABNORMAL LOW (ref 5–15)
BUN: 14 mg/dL (ref 6–20)
CHLORIDE: 120 mmol/L — AB (ref 101–111)
CO2: 13 mmol/L — AB (ref 22–32)
Calcium: 7.2 mg/dL — ABNORMAL LOW (ref 8.9–10.3)
Creatinine, Ser: 0.56 mg/dL — ABNORMAL LOW (ref 0.61–1.24)
GFR calc Af Amer: >60 mL/min (ref >60)
GFR calc non Af Amer: >60 mL/min (ref >60)
GLUCOSE: 303 mg/dL — AB (ref 65–99)
Potassium: 4.3 mmol/L (ref 3.5–5.1)
Sodium: 134 mmol/L — ABNORMAL LOW (ref 135–145)

## 2015-05-02 LAB — URINE CULTURE: Special Requests: NORMAL

## 2015-05-02 LAB — CBC WITH DIFFERENTIAL/PLATELET
BASOS ABS: 0 10*3/uL (ref 0.0–0.1)
BASOS PCT: 0 % (ref 0–1)
EOS PCT: 0 % (ref 0–5)
Eosinophils Absolute: 0 10*3/uL (ref 0.0–0.7)
HEMATOCRIT: 26.4 % — AB (ref 39.0–52.0)
HEMOGLOBIN: 8.5 g/dL — AB (ref 13.0–17.0)
Lymphocytes Relative: 5 % — ABNORMAL LOW (ref 12–46)
Lymphs Abs: 1.6 10*3/uL (ref 0.7–4.0)
MCH: 26 pg (ref 26.0–34.0)
MCHC: 32.2 g/dL (ref 30.0–36.0)
MCV: 80.7 fL (ref 78.0–100.0)
Monocytes Absolute: 0.3 10*3/uL (ref 0.1–1.0)
Monocytes Relative: 1 % — ABNORMAL LOW (ref 3–12)
Neutro Abs: 29.4 10*3/uL — ABNORMAL HIGH (ref 1.7–7.7)
Neutrophils Relative %: 94 % — ABNORMAL HIGH (ref 43–77)
Platelets: 535 10*3/uL — ABNORMAL HIGH (ref 150–400)
RBC: 3.27 MIL/uL — ABNORMAL LOW (ref 4.22–5.81)
RDW: 16.9 % — AB (ref 11.5–15.5)
WBC: 31.3 10*3/uL — AB (ref 4.0–10.5)

## 2015-05-02 LAB — GLUCOSE, CAPILLARY
GLUCOSE-CAPILLARY: 278 mg/dL — AB (ref 65–99)
GLUCOSE-CAPILLARY: 215 mg/dL — AB (ref 65–99)
Glucose-Capillary: 238 mg/dL — ABNORMAL HIGH (ref 65–99)

## 2015-05-02 LAB — PHOSPHORUS: PHOSPHORUS: 3.2 mg/dL (ref 2.5–4.6)

## 2015-05-02 LAB — MAGNESIUM: Magnesium: 1.1 mg/dL — ABNORMAL LOW (ref 1.7–2.4)

## 2015-05-02 MED ORDER — MAGNESIUM SULFATE 4 GM/100ML IV SOLN
4.0000 g | Freq: Once | INTRAVENOUS | Status: AC
  Administered 2015-05-02: 4 g via INTRAVENOUS
  Filled 2015-05-02: qty 100

## 2015-05-02 MED ORDER — HYDROMORPHONE HCL 1 MG/ML IJ SOLN
0.5000 mg | INTRAMUSCULAR | Status: DC | PRN
  Administered 2015-05-02 – 2015-05-04 (×9): 0.5 mg via INTRAVENOUS
  Filled 2015-05-02 (×9): qty 1

## 2015-05-02 MED ORDER — ENSURE ENLIVE PO LIQD
237.0000 mL | Freq: Two times a day (BID) | ORAL | Status: DC
  Administered 2015-05-07 (×2): 237 mL via ORAL

## 2015-05-02 MED ORDER — IOHEXOL 300 MG/ML  SOLN
25.0000 mL | INTRAMUSCULAR | Status: AC
  Administered 2015-05-02 (×2): 25 mL via ORAL

## 2015-05-02 MED ORDER — IOHEXOL 300 MG/ML  SOLN
100.0000 mL | Freq: Once | INTRAMUSCULAR | Status: AC | PRN
  Administered 2015-05-02: 100 mL via INTRAVENOUS

## 2015-05-02 NOTE — Progress Notes (Signed)
PULMONARY / CRITICAL CARE MEDICINE   Name: Jason [name] MRN: [id number] DOB: 05/19/1986    ADMISSION DATE:  04/25/2015 CONSULTATION DATE:  05/01/2015  REFERRING MD :  TRH  CHIEF COMPLAINT:  Hypotension  INITIAL PRESENTATION: 29 year old male quad with MRSA bacteremia from multiple sources.  Concern for endocarditis.  Wounds have been checked and followed by general surgery and ortho.  Patient has history of hypotension and concern for septic shock and levophed was started and PCCM consulted.   SUBJECTIVE:  Patient received Dilaudid IV this morning and was sleeping until awoken. Only reports pain in his "lips" at this moment. Patient not answering any other questions appropriately. Mother at bedside.  ROS:  Accurate ROS unobtainable as patient is altered after narcotic injection.  VITAL SIGNS: Temp:  [96.9 F (36.1 C)-98.6 F (37 C)] 97.8 F (36.6 C) (07/25 0800) Resp:  [7-24] 14 (07/25 0805) BP: (117-140)/(76-100) 134/82 mmHg (07/25 0755) SpO2:  [100 %] 100 % (07/25 0620) Arterial Line BP: (65-150)/(24-74) 105/44 mmHg (07/25 0805) HEMODYNAMICS:   VENTILATOR SETTINGS:   INTAKE / OUTPUT:  Intake/Output Summary (Last 24 hours) at 05/02/15 1034 Last data filed at 05/02/15 0800  Gross per 24 hour  Intake 2684.71 ml  Output   2200 ml  Net 484.71 ml    PHYSICAL EXAMINATION: General:  Laying in bed sleeping until awoken. No acute distress. Minimally alert. Neuro:  Able to move upper extremities somewhat. Attends to voice. Incomplete quadriplegia.  HEENT:  No scleral icterus. Moist mucus membranes. Pupils equal. Cardiovascular:  Regular rate. No appreciable JVD. Sinus rhythm on tele. Lungs:  Decreased breath sounds bilateral bases. Normal work of breathing. No accessory muscle use. Abdomen:  Soft. Nondistended. Nontender. Ostomy in place. Musculoskeletal:  Does have joint contractions. No appreciable joint effusions. Skin:  Warm & dry. No rash  appreciated.  LABS:  CBC  Recent Labs Lab 04/30/15 0440 05/01/15 0410 05/02/15 0536  WBC 33.6* 26.0* 31.3*  HGB 8.6* 8.2* 8.5*  HCT 28.4* 25.9* 26.4*  PLT 648* 600* 535*   Coag's No results for input(s): APTT, INR in the last 168 hours. BMET  Recent Labs Lab 04/30/15 0440 05/01/15 0410 05/02/15 0536  NA 135 138 134*  K 3.1* 3.7 4.3  CL 119* 122* 120*  CO2 13* 14* 13*  BUN 12 12 14   CREATININE 0.46* 0.52* 0.56*  GLUCOSE 178* 102* 303*   Electrolytes  Recent Labs Lab 04/25/15 1612  04/30/15 0440 05/01/15 0410 05/02/15 0536  CALCIUM 10.2  < > 7.5* 7.3* 7.2*  MG 1.5*  --  1.1*  --  1.1*  PHOS  --   --   --   --  3.2  < > = values in this interval not displayed. Sepsis Markers  Recent Labs Lab 04/25/15 1635  LATICACIDVEN 1.23   ABG No results for input(s): PHART, PCO2ART, PO2ART in the last 168 hours. Liver Enzymes  Recent Labs Lab 04/26/15 0400 04/27/15 0815 04/28/15 0640  AST 10* 12* 8*  ALT 7* 7* 7*  ALKPHOS 133* 206* 173*  BILITOT 0.6 0.5 0.4  ALBUMIN 1.3* 1.2* 1.1*   Cardiac Enzymes No results for input(s): TROPONINI, PROBNP in the last 168 hours. Glucose  Recent Labs Lab 04/30/15 2133 05/01/15 0806 05/01/15 1229 05/01/15 1648 05/01/15 2231 05/02/15 0733  GLUCAP 128* 58* 130* 171* 152* 278*   Imaging No results found.  ASSESSMENT / PLAN:  PULMONARY Trach, chronic. A:  No Acute Issues  P:   - O2  as needed. - Monitor for airway protection.  CARDIOVASCULAR Right Arm PICC 7/19>>> Left Radial Arterial Line 7/24>>> A:  Hypotension chronic in nature.    P:  Levophed titrate for SBP >80 & MAP >55 Continue Midodrine 20mg  PO TID   RENAL A:   UOP adequate.  Hyperchloremia >> improving. Hypomagnesemia  P:   S/P Magnesium Sulfate 4gm this AM Repeat Mg level in AM 7/26 Monitoring electrolytes daily Continuing 1/2 NS   GASTROINTESTINAL A:   No acute issues.  P:   Diet as ordered.    HEMATOLOGIC A:   Leukocytosis >> Worsened likely secondary to steroids.  P:  CBC in AM. Transfuse per ICU protocol.  INFECTIOUS A:  MRSA bacteremia.   P:   BCx2 MRSA TEE ordered IR drainage of abscess planned  Vancomycin 7/19>>> Zosyn 7/19>>>  ENDOCRINE A:   Possible adrenal insufficiency >> Cortisol low but no response in BP to stress dosed steroids.  P:   D/C Hydrocortisone.  NEUROLOGIC A:  Quad post trauma.  P:   Pain management for wounds Decreasing Dilaudid IV given sedation this AM  FAMILY  - Updates: mother updated at bedside today regarding concerns with pain medication.  I have spent a total of 33 minutes of critical care time reviewing the patient's EMR, updating the patient's mother, discussing the plan of care with the patient's nurse and caring for this patient today.  Sonia Baller Ashok Cordia, M.D. [facility] Pulmonary & Critical Care Pager:  [phone] After 3pm or if no response, call [phone]   05/02/2015, 10:34 AM

## 2015-05-02 NOTE — Progress Notes (Signed)
Date:  May 02, 2015 U.R. performed for needs and level of care. Pod 3/remains on hemodynamic monitoring. Will continue to follow for Case Management needs.  Velva Harman, RN, BSN, [place]   [phone]

## 2015-05-02 NOTE — Progress Notes (Signed)
Patient doing ok.  Heel ulcer checked and dressing changed.    Pulses in foot are not easily palpable.    Plan for CT guided drainage of pelvic abscess today.    Heel ulcer per wound care team.  If it doesn't heal and becomes a clinical problem, he may need BKA.  Please call with additional questions.  Surgical drainage of intrapelvic abscess is outside my scope of practice, and if necessary, would need tertiary care referral.     .Johnny Bridge, MD

## 2015-05-02 NOTE — Progress Notes (Signed)
Per night shift RN, someone called from radiology this AM requesting to keep patient NPO because pt was going to have Perc aspiration of obturator abscess done today. Pt was taken to CT around 2 PM, but I was told by the technician that pt was only getting CT w/ contrast done. MD is made aware.

## 2015-05-02 NOTE — Progress Notes (Signed)
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 He was admitted for further evaluation of sepsis. His blood cultures revealed MRSA and has been on vancomycin. ID on board.  MRi of the pelvis revealed a small RIGHT internal obturator abscess and plan to undergo CT guided aspiration by IR.   Assessment/Plan: 1. CXR findings of bibasilar infiltrate concerning for atypical pneumonia, without any symptomatology of pneumonia.  Started on on IV vancomycin and IV azactum. Blood cultures from 7/18 showed MRSA  Sensitive to vancomycin and tetracyclines.  Afebrile overnight, repeat blood cultures pending and negative so far.  Appreciate infectious disease Dr Algis Downs input.      2. MRSA bacteremia:  On IV vancomycin.  Requested cardiology for TEE. Initially scheduled for TEE ON 7/22, but since he was on levophed , it could not be done.   Sacral decubitus ulcer/ left internal obturator abscess: Wound care following.  MRI of the sacrum, pelvis and left foot ordered as per recommendations of ID. Results shows chronic osteomyelitis and right internal obturator abscess extending fromt he right ischial tuberosity decubitus ulcer. No osteomyelitis of the left foot heel.  Surgery consulted and recommendations given. He underwent bedside debridement.  Orthopedics  consulted for further recommendations, recommended IR consult for aspiration of the abscess.     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  He resides at home and mother is the caregiver.    UTI ? Vs colonization: -urine cultures show E coli.  Type 1 DM: CBG (last 3)   Recent Labs  05/01/15 2231 05/02/15 0733 05/02/15 1130  GLUCAP 152* 278* 238*    cbg's are improving. His hemoglobin a1c is 8.3, started him on small dose of lantus and SSI.   Chronic hypotension:  On midodrine 20 mg TID,but has required levophed on and off for the last 4 days. Requesting PCCM input for recommendations.  A line ordered . Appreciate PCCM recommendations.    Thrombocytosis: Probably reactive.    Non anion gap metabolic acidosis: Will start him on bicarbonate drip.   Hypokalemia repleted as needed.     Code Status: FULL CODE.  Family Communication: mother at bedside Disposition Plan: pending further work up.    Consultants:  INFECTIOUS DISEASE.   ORTHOPEDICS  SURGERY  Procedures:  MRI of the pelvis / sacrum  MRI left foot  TEE to be scheduled.   Antibiotics:  Vancomycin   Aztreonam.  HPI/Subjective: Sleeping. VERY HARD to arouse, when he wakes up answers questions appropriately.  Objective: Filed Vitals:   05/02/15 1550  BP:   Pulse:   Temp:   Resp: 12    Intake/Output Summary (Last 24 hours) at 05/02/15 1608 Last data filed at 05/02/15 1600  Gross per 24 hour  Intake 2435.56 ml  Output   1800 ml  Net 635.56 ml   Filed Weights   04/29/15 0545 04/30/15 0400 05/01/15 0427  Weight: 59.4 kg (130 lb 15.3 oz) 61.1 kg (134 lb 11.2 oz) 63.4 kg (139 lb 12.4 oz)    Exam:   General:  Lethargic,but answering questions appropriately, reports pain is well controlled.   Cardiovascular: s1s2  Respiratory: diminished at bases, no wheezing or rhonchi  Abdomen: soft non tender non distended bowel sounds heard  Musculoskeletal: no edema , left foot ulcer  on heel.   Data Reviewed: Basic Metabolic Panel:  Recent Labs Lab 04/25/15 1612  04/28/15 0640 04/29/15 0352 04/30/15 0440 05/01/15 0410 05/02/15 0536  NA 130*  < > 136 134* 135 138 134*  K 4.7  < > 3.5 3.8 3.1* 3.7 4.3  CL 105  < > 117* 116* 119* 122* 120*  CO2 20*  < > 15* 14* 13* 14* 13*  GLUCOSE 116*  < > 155* 394* 178* 102* 303*  BUN 20  < > 11 13  12 12 14   CREATININE 0.68  < > 0.42* 0.47* 0.46* 0.52* 0.56*  CALCIUM 10.2  < > 8.2* 7.8* 7.5* 7.3* 7.2*  MG 1.5*  --   --   --  1.1*  --  1.1*  PHOS  --   --   --   --   --   --  3.2  < > = values in this interval not displayed. Liver Function Tests:  Recent Labs Lab 04/25/15 1612 04/26/15 0400 04/27/15 0815 04/28/15 0640  AST 12* 10* 12* 8*  ALT 7* 7* 7* 7*  ALKPHOS 160* 133* 206* 173*  BILITOT 0.3 0.6 0.5 0.4  PROT 7.3 6.0* 6.0* 5.3*  ALBUMIN 1.7* 1.3* 1.2* 1.1*   No results for input(s): LIPASE, AMYLASE in the last 168 hours. No results for input(s): AMMONIA in the last 168 hours. CBC:  Recent Labs Lab 04/28/15 0640 04/29/15 0352 04/30/15 0440 05/01/15 0410 05/02/15 0536  WBC 31.1* 34.6* 33.6* 26.0* 31.3*  NEUTROABS  --  29.5* 27.6* 19.8* 29.4*  HGB 9.3* 10.2* 8.6* 8.2* 8.5*  HCT 28.9* 32.3* 28.4* 25.9* 26.4*  MCV 79.0 80.1 80.0 81.2 80.7  PLT 625* 726* 648* 600* 535*   Cardiac Enzymes: No results for input(s): CKTOTAL, CKMB, CKMBINDEX, TROPONINI in the last 168 hours. BNP (last 3 results) No results for input(s): BNP in the last 8760 hours.  ProBNP (last 3 results) No results for input(s): PROBNP in the last 8760 hours.  CBG:  Recent Labs Lab 05/01/15 1229 05/01/15 1648 05/01/15 2231 05/02/15 0733 05/02/15 1130  GLUCAP 130* 171* 152* 278* 238*    Recent Results (from the past 240 hour(s))  Urine culture     Status: None   Collection Time: 04/25/15  4:35 PM  Result Value Ref Range Status   Specimen Description URINE, CLEAN CATCH  Final   Special Requests Normal  Final   Culture   Final    >=100,000 COLONIES/mL ESCHERICHIA COLI AZTREONAM SENSITIVE ZONE 32MM Performed at [facility] Performed at [facility]    Report Status 05/02/2015 FINAL  Final   Organism ID, Bacteria ESCHERICHIA COLI  Final      Susceptibility   Escherichia coli - MIC*    AMPICILLIN <=2 SENSITIVE Sensitive     CEFAZOLIN <=4 SENSITIVE Sensitive      CEFTRIAXONE <=1 SENSITIVE Sensitive     CIPROFLOXACIN >=4 RESISTANT Resistant     GENTAMICIN <=1 SENSITIVE Sensitive     IMIPENEM <=0.25 SENSITIVE Sensitive     NITROFURANTOIN <=16 SENSITIVE Sensitive     TRIMETH/SULFA <=20 SENSITIVE Sensitive     AMPICILLIN/SULBACTAM <=2 SENSITIVE Sensitive     PIP/TAZO <=4 SENSITIVE Sensitive     * >=100,000 COLONIES/mL ESCHERICHIA COLI  Blood culture (routine x 2)     Status: None   Collection Time: 04/25/15  5:00 PM  Result Value Ref Range Status   Specimen Description BLOOD RIGHT ANTECUBITAL  Final   Special Requests BOTTLES  DRAWN AEROBIC AND ANAEROBIC 5 CC EA  Final   Culture  Setup Time   Final    GRAM POSITIVE COCCI IN CLUSTERS Gram Stain Report Called to,Read Back By and Verified With: Elveria Rising RN 17:30 04/26/15 ([email or web address]) IN BOTH AEROBIC AND ANAEROBIC BOTTLES    Culture   Final    METHICILLIN RESISTANT STAPHYLOCOCCUS AUREUS MICROAEROPHILIC STREPTOCOCCI Standardized susceptibility testing for this organism is not available. Performed at [facility]    Report Status 04/30/2015 FINAL  Final   Organism ID, Bacteria METHICILLIN RESISTANT STAPHYLOCOCCUS AUREUS  Final      Susceptibility   Methicillin resistant staphylococcus aureus - MIC*    CIPROFLOXACIN >=8 RESISTANT Resistant     ERYTHROMYCIN >=8 RESISTANT Resistant     GENTAMICIN <=0.5 SENSITIVE Sensitive     OXACILLIN >=4 RESISTANT Resistant     TETRACYCLINE <=1 SENSITIVE Sensitive     VANCOMYCIN <=0.5 SENSITIVE Sensitive     TRIMETH/SULFA >=320 RESISTANT Resistant     CLINDAMYCIN >=8 RESISTANT Resistant     RIFAMPIN <=0.5 SENSITIVE Sensitive     Inducible Clindamycin NEGATIVE Sensitive     * METHICILLIN RESISTANT STAPHYLOCOCCUS AUREUS  Blood culture (routine x 2)     Status: None (Preliminary result)   Collection Time: 04/25/15  5:16 PM  Result Value Ref Range Status   Specimen Description BLOOD BLOOD RIGHT FOREARM  Final   Special Requests BOTTLES DRAWN AEROBIC AND  ANAEROBIC 5 CC EA  Final   Culture  Setup Time   Final    GRAM POSITIVE COCCI IN CLUSTERS CRITICAL RESULT CALLED TO, READ BACK BY AND VERIFIED WITH: S GRAHAM 04/26/15 @ 79 M VESTAL IN BOTH AEROBIC AND ANAEROBIC BOTTLES    Culture   Final    STAPHYLOCOCCUS AUREUS SUSCEPTIBILITIES PERFORMED ON PREVIOUS CULTURE WITHIN THE LAST 5 DAYS. Performed at [facility]    Report Status PENDING  Incomplete  MRSA PCR Screening     Status: Abnormal   Collection Time: 04/25/15  6:55 PM  Result Value Ref Range Status   MRSA by PCR POSITIVE (A) NEGATIVE Final    Comment:        The GeneXpert MRSA Assay (FDA approved for NASAL specimens only), is one component of a comprehensive MRSA colonization surveillance program. It is not intended to diagnose MRSA infection nor to guide or monitor treatment for MRSA infections. RESULT CALLED TO, READ BACK BY AND VERIFIED WITH: Sharyn Lull REEVES,RN [id number] @ 2043 BY J SCOTTON   Culture, blood (routine x 2)     Status: None (Preliminary result)   Collection Time: 04/28/15  1:20 PM  Result Value Ref Range Status   Specimen Description BLOOD LEFT ARM  Final   Special Requests IN PEDIATRIC BOTTLE 1CC  Final   Culture  Setup Time   Final    GRAM POSITIVE COCCI IN CLUSTERS AEROBIC BOTTLE ONLY CRITICAL RESULT CALLED TO, READ BACK BY AND VERIFIED WITH: Hetty Blend RN 2222 04/30/15 A BROWNING    Culture   Final    CULTURE REINCUBATED FOR BETTER GROWTH Performed at [facility]    Report Status PENDING  Incomplete  Culture, blood (routine x 2)     Status: None (Preliminary result)   Collection Time: 04/28/15  5:50 PM  Result Value Ref Range Status   Specimen Description BLOOD PICC LINE  Final   Special Requests BOTTLES DRAWN AEROBIC AND ANAEROBIC 5CC EACH  Final   Culture   Final    NO GROWTH  4 DAYS Performed at [facility]    Report Status PENDING  Incomplete     Studies: Ct Pelvis W Contrast  05/02/2015   CLINICAL DATA:  Pelvic  abscess.  EXAM: CT PELVIS WITH CONTRAST  TECHNIQUE: Multidetector CT imaging of the pelvis was performed using the standard protocol following the bolus administration of intravenous contrast.  CONTRAST:  163mL OMNIPAQUE IOHEXOL 300 MG/ML  SOLN  COMPARISON:  MRI scan of April 28, 2015; CT scan of December 11, 2014.  FINDINGS: The patient appears to be status post surgical resection of the distal sacrum and coccyx. Overlying sacral decubitus ulcer is noted. Right buttocks ulceration is seen extending to the ischial tuberosity, where some lucency is noted suggesting osteomyelitis. Moderate ascites is noted. Ostomy is noted in left lower quadrant. Suprapubic catheter is noted within the urinary bladder. There is no evidence of bowel obstruction. The appendix appears normal. Moderate soft tissue swelling or anasarca is noted in the visualized soft tissues. Two fluid collections are noted along the right side of the pelvic wall just medial to right acetabulum ; 1 measures 27 x 13 mm superiorly. The other measures 36 x 12 mm more inferiorly. These appear to communicate posteriorly.  IMPRESSION: Moderate ascites.  Moderate anasarca is noted.  Status post surgical resection of distal sacrum and coccyx with overlying sacral decubitus ulcer.  Large deep ulceration is seen involving right buttocks area which extends to the cortex of the right ischial tuberosity, where some lucency is noted suggesting osteomyelitis. Two abscesses are noted in this area along the medial portion of the right acetabulum which appear to communicate posteriorly.   Electronically Signed   By: Marijo Conception, M.D.   On: 05/02/2015 14:47    Scheduled Meds: . apixaban  5 mg Oral BID  . aztreonam  1 g Intravenous Q8H  . baclofen  10 mg Oral BID WC  . baclofen  20 mg Oral QHS  . feeding supplement (GLUCERNA SHAKE)  237 mL Oral TID BM  . feeding supplement (PRO-STAT SUGAR FREE 64)  30 mL Oral TID BM  . [START ON 05/04/2015] fentaNYL  50 mcg  Transdermal Q72H  . insulin aspart  0-15 Units Subcutaneous TID WC  . insulin glargine  5 Units Subcutaneous Daily  . loratadine  10 mg Oral QHS  . midodrine  20 mg Oral TID WC  . mirtazapine  7.5 mg Oral QHS  . pregabalin  100 mg Oral TID AC  . pregabalin  200 mg Oral QHS  . sodium chloride  10-40 mL Intracatheter Q12H  . vancomycin  750 mg Intravenous Q24H   Continuous Infusions: . sodium chloride    . norepinephrine (LEVOPHED) Adult infusion Stopped (05/02/15 1520)  .  sodium bicarbonate  infusion 1000 mL 75 mL/hr at 05/02/15 1500    Active Problems:   Type 1 diabetes, uncontrolled, with neuropathy   Spinal cord infarction (history of)   Sepsis   Stage IV decubitus ulcer   Urinary tract infectious disease   HCAP (healthcare-associated pneumonia)   Hypotension    Time spent:35 min    Georganna Maxson  [facility] Pager [phone]  If 7PM-7AM, please contact night-coverage at [email or web address], password [facility] 05/02/2015, 4:08 PM  LOS: 7 days

## 2015-05-02 NOTE — Consult Note (Signed)
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 Stage 4 Pressure injury to left heel. Full thickness tissue injury to mid thoracic area. Wound type:pressure Measurement:Not measured today.  Wound measurements to be performed on Wednesday. Wound bed: Red, moist. <25% necrotic tissue Drainage (amount, consistency, odor) serous, small amount.  No bleeding today. Periwound: Intact, macerated at the sub scrotal wound junction. Some evidence of previous wound healing (i.e., scarring) Dressing procedure/placement/frequency: Routine re initiation of NPWT to bilateral ischial tuberosity and sacral wounds today after it was discontinued on Friday for CCS assessment and intervention.  Orthopedic consultation followed on Saturday during which bedside debridements were performed. Mother at bedside and suggests that bridging three pressure injuries works somewhat better than using a "y[employer] school teacher; I am happy to initiate therapy in this manner.  Mother is well known to me from her time as an employee of this hospital system. Wounds cleansed with NS and gently patted dry.  Defects filled with black foam (3), periwound skin protected with drape.  Foam (3) placed on top of drape to connect wounds, covered with drape. One half of a skin barrier ring is used to seal the drape at the sub scrotal wound junction. System connected to NPWT device and a seal is achieved at 162mmHg continuous negative pressure. Silver hydrofiber dressing used to cover heel pressure injuries following cleanse with NS and pat dry.  Secured with Kerlix/tape.  White petrolatum dressings placed over full thickness tissue loss on mid thoracic area after NS cleanse and pat dry.  These are topped with ABD pads (2) and secured with tape. Patient and wife expressed appreciation for care provided.  Next dressing (NPWT) change is due on Wednesday, July 27. One of my partners will perform on that day as I will be out  of facility.  WOC ostomy follow up Stoma type/location: LLQ colostomy Stomal assessment/size: 1 inch pink, moist Peristomal assessment: Not seen today Treatment options for stomal/peristomal skin: [name] reports use of skin barrier ring Output liquid brown stool with flatus Ostomy pouching: 2pc. 2 and 1/4 inch pouching system with skin barrier ring  WOC nursing team will follow, will remain available to this patient, the nursing, surgical and medical teams.   Thanks, Maudie Flakes, MSN, RN, [place], [place], [place] [phone])

## 2015-05-02 NOTE — Progress Notes (Signed)
[place] Progress Note Patient Name: BARTLEY VUOLO DOB: 10-Feb-1986 MRN: [id number]   Date of Service  05/02/2015  HPI/Events of Note  Hypomag  eICU Interventions  Mag replaced     Intervention Category Intermediate Interventions: Electrolyte abnormality - evaluation and management  DETERDING,ELIZABETH 05/02/2015, 6:30 AM

## 2015-05-02 NOTE — Progress Notes (Signed)
INFECTIOUS DISEASE PROGRESS NOTE  ID: Jason [name] is a 29 y.o. male with  Active Problems:   Type 1 diabetes, uncontrolled, with neuropathy   Spinal cord infarction (history of)   Sepsis   Stage IV decubitus ulcer   Urinary tract infectious disease   HCAP (healthcare-associated pneumonia)   Hypotension  Subjective: Resting quietly  Abtx:  Anti-infectives    Start     Dose/Rate Route Frequency Ordered Stop   04/28/15 2000  vancomycin (VANCOCIN) IVPB 750 mg/150 ml premix     750 mg 150 mL/hr over 60 Minutes Intravenous Every 24 hours 04/28/15 0731     04/26/15 0800  vancomycin (VANCOCIN) IVPB 750 mg/150 ml premix  Status:  Discontinued     750 mg 150 mL/hr over 60 Minutes Intravenous Every 12 hours 04/25/15 1715 04/28/15 0731   04/26/15 0200  aztreonam (AZACTAM) 1 g in dextrose 5 % 50 mL IVPB     1 g 100 mL/hr over 30 Minutes Intravenous Every 8 hours 04/25/15 1919     04/25/15 1715  vancomycin (VANCOCIN) IVPB 750 mg/150 ml premix     750 mg 150 mL/hr over 60 Minutes Intravenous STAT 04/25/15 1715 04/25/15 1906   04/25/15 1645  aztreonam (AZACTAM) 2 g in dextrose 5 % 50 mL IVPB     2 g 100 mL/hr over 30 Minutes Intravenous  Once 04/25/15 1643 04/25/15 1807      Medications:  Scheduled: . apixaban  5 mg Oral BID  . aztreonam  1 g Intravenous Q8H  . baclofen  10 mg Oral BID WC  . baclofen  20 mg Oral QHS  . feeding supplement (GLUCERNA SHAKE)  237 mL Oral TID BM  . feeding supplement (PRO-STAT SUGAR FREE 64)  30 mL Oral TID BM  . [START ON 05/04/2015] fentaNYL  50 mcg Transdermal Q72H  . insulin aspart  0-15 Units Subcutaneous TID WC  . insulin glargine  5 Units Subcutaneous Daily  . loratadine  10 mg Oral QHS  . midodrine  20 mg Oral TID WC  . mirtazapine  7.5 mg Oral QHS  . pregabalin  100 mg Oral TID AC  . pregabalin  200 mg Oral QHS  . sodium chloride  10-40 mL Intracatheter Q12H  . vancomycin  750 mg Intravenous Q24H    Objective: Vital signs in last  24 hours: Temp:  [96.9 F (36.1 C)-97.8 F (36.6 C)] 97.4 F (36.3 C) (07/25 1200) Resp:  [7-19] 12 (07/25 1550) BP: (78-134)/(38-82) 112/75 mmHg (07/25 1440) SpO2:  [100 %] 100 % (07/25 1550) Arterial Line BP: (65-150)/(24-67) 97/49 mmHg (07/25 1550)   General appearance: no distress Skin: L heel wound- clean, no d/c expressable. some posterior eschar. sacral wound and back wounds are examined, there is no d/c. they are quite deep.   Lab Results  Recent Labs  05/01/15 0410 05/02/15 0536  WBC 26.0* 31.3*  HGB 8.2* 8.5*  HCT 25.9* 26.4*  NA 138 134*  K 3.7 4.3  CL 122* 120*  CO2 14* 13*  BUN 12 14  CREATININE 0.52* 0.56*   Liver Panel No results for input(s): PROT, ALBUMIN, AST, ALT, ALKPHOS, BILITOT, BILIDIR, IBILI in the last 72 hours. Sedimentation Rate No results for input(s): ESRSEDRATE in the last 72 hours. C-Reactive Protein No results for input(s): CRP in the last 72 hours.  Microbiology: Recent Results (from the past 240 hour(s))  Urine culture     Status: None   Collection Time: 04/25/15  4:35 PM  Result Value Ref Range Status   Specimen Description URINE, CLEAN CATCH  Final   Special Requests Normal  Final   Culture   Final    >=100,000 COLONIES/mL ESCHERICHIA COLI AZTREONAM SENSITIVE ZONE 32MM Performed at [employer] Performed at [facility]    Report Status 05/02/2015 FINAL  Final   Organism ID, Bacteria ESCHERICHIA COLI  Final      Susceptibility   Escherichia coli - MIC*    AMPICILLIN <=2 SENSITIVE Sensitive     CEFAZOLIN <=4 SENSITIVE Sensitive     CEFTRIAXONE <=1 SENSITIVE Sensitive     CIPROFLOXACIN >=4 RESISTANT Resistant     GENTAMICIN <=1 SENSITIVE Sensitive     IMIPENEM <=0.25 SENSITIVE Sensitive     NITROFURANTOIN <=16 SENSITIVE Sensitive     TRIMETH/SULFA <=20 SENSITIVE Sensitive     AMPICILLIN/SULBACTAM <=2 SENSITIVE Sensitive     PIP/TAZO <=4 SENSITIVE Sensitive     * >=100,000 COLONIES/mL ESCHERICHIA COLI    Blood culture (routine x 2)     Status: None   Collection Time: 04/25/15  5:00 PM  Result Value Ref Range Status   Specimen Description BLOOD RIGHT ANTECUBITAL  Final   Special Requests BOTTLES DRAWN AEROBIC AND ANAEROBIC 5 CC EA  Final   Culture  Setup Time   Final    GRAM POSITIVE COCCI IN CLUSTERS Gram Stain Report Called to,Read Back By and Verified With: Elveria Rising RN 17:30 04/26/15 ([email or web address]) IN BOTH AEROBIC AND ANAEROBIC BOTTLES    Culture   Final    METHICILLIN RESISTANT STAPHYLOCOCCUS AUREUS MICROAEROPHILIC STREPTOCOCCI Standardized susceptibility testing for this organism is not available. Performed at [facility]    Report Status 04/30/2015 FINAL  Final   Organism ID, Bacteria METHICILLIN RESISTANT STAPHYLOCOCCUS AUREUS  Final      Susceptibility   Methicillin resistant staphylococcus aureus - MIC*    CIPROFLOXACIN >=8 RESISTANT Resistant     ERYTHROMYCIN >=8 RESISTANT Resistant     GENTAMICIN <=0.5 SENSITIVE Sensitive     OXACILLIN >=4 RESISTANT Resistant     TETRACYCLINE <=1 SENSITIVE Sensitive     VANCOMYCIN <=0.5 SENSITIVE Sensitive     TRIMETH/SULFA >=320 RESISTANT Resistant     CLINDAMYCIN >=8 RESISTANT Resistant     RIFAMPIN <=0.5 SENSITIVE Sensitive     Inducible Clindamycin NEGATIVE Sensitive     * METHICILLIN RESISTANT STAPHYLOCOCCUS AUREUS  Blood culture (routine x 2)     Status: None (Preliminary result)   Collection Time: 04/25/15  5:16 PM  Result Value Ref Range Status   Specimen Description BLOOD BLOOD RIGHT FOREARM  Final   Special Requests BOTTLES DRAWN AEROBIC AND ANAEROBIC 5 CC EA  Final   Culture  Setup Time   Final    GRAM POSITIVE COCCI IN CLUSTERS CRITICAL RESULT CALLED TO, READ BACK BY AND VERIFIED WITH: S GRAHAM 04/26/15 @ 81 M VESTAL IN BOTH AEROBIC AND ANAEROBIC BOTTLES    Culture   Final    STAPHYLOCOCCUS AUREUS SUSCEPTIBILITIES PERFORMED ON PREVIOUS CULTURE WITHIN THE LAST 5 DAYS. Performed at [facility]    Report  Status PENDING  Incomplete  MRSA PCR Screening     Status: Abnormal   Collection Time: 04/25/15  6:55 PM  Result Value Ref Range Status   MRSA by PCR POSITIVE (A) NEGATIVE Final    Comment:        The GeneXpert MRSA Assay (FDA approved for NASAL specimens only), is one component of a comprehensive MRSA  colonization surveillance program. It is not intended to diagnose MRSA infection nor to guide or monitor treatment for MRSA infections. RESULT CALLED TO, READ BACK BY AND VERIFIED WITH: Sharyn Lull REEVES,RN [id number] @ 2043 BY J SCOTTON   Culture, blood (routine x 2)     Status: None (Preliminary result)   Collection Time: 04/28/15  1:20 PM  Result Value Ref Range Status   Specimen Description BLOOD LEFT ARM  Final   Special Requests IN PEDIATRIC BOTTLE 1CC  Final   Culture  Setup Time   Final    GRAM POSITIVE COCCI IN CLUSTERS AEROBIC BOTTLE ONLY CRITICAL RESULT CALLED TO, READ BACK BY AND VERIFIED WITH: Hetty Blend RN 2222 04/30/15 A BROWNING    Culture   Final    CULTURE REINCUBATED FOR BETTER GROWTH Performed at [facility]    Report Status PENDING  Incomplete  Culture, blood (routine x 2)     Status: None (Preliminary result)   Collection Time: 04/28/15  5:50 PM  Result Value Ref Range Status   Specimen Description BLOOD PICC LINE  Final   Special Requests BOTTLES DRAWN AEROBIC AND ANAEROBIC 5CC EACH  Final   Culture   Final    NO GROWTH 4 DAYS Performed at [facility]    Report Status PENDING  Incomplete    Studies/Results: Ct Pelvis W Contrast  05/02/2015   CLINICAL DATA:  Pelvic abscess.  EXAM: CT PELVIS WITH CONTRAST  TECHNIQUE: Multidetector CT imaging of the pelvis was performed using the standard protocol following the bolus administration of intravenous contrast.  CONTRAST:  166mL OMNIPAQUE IOHEXOL 300 MG/ML  SOLN  COMPARISON:  MRI scan of April 28, 2015; CT scan of December 11, 2014.  FINDINGS: The patient appears to be status post surgical resection of  the distal sacrum and coccyx. Overlying sacral decubitus ulcer is noted. Right buttocks ulceration is seen extending to the ischial tuberosity, where some lucency is noted suggesting osteomyelitis. Moderate ascites is noted. Ostomy is noted in left lower quadrant. Suprapubic catheter is noted within the urinary bladder. There is no evidence of bowel obstruction. The appendix appears normal. Moderate soft tissue swelling or anasarca is noted in the visualized soft tissues. Two fluid collections are noted along the right side of the pelvic wall just medial to right acetabulum ; 1 measures 27 x 13 mm superiorly. The other measures 36 x 12 mm more inferiorly. These appear to communicate posteriorly.  IMPRESSION: Moderate ascites.  Moderate anasarca is noted.  Status post surgical resection of distal sacrum and coccyx with overlying sacral decubitus ulcer.  Large deep ulceration is seen involving right buttocks area which extends to the cortex of the right ischial tuberosity, where some lucency is noted suggesting osteomyelitis. Two abscesses are noted in this area along the medial portion of the right acetabulum which appear to communicate posteriorly.   Electronically Signed   By: Marijo Conception, M.D.   On: 05/02/2015 14:47     Assessment/Plan: MRSA bacteremia Decubitus ulcers R internal obturator abscess L heel wound- no abscess or osteo UTI- E coli ? HCAP Thrombocytosis, leukocytosis Quadraplegia Protein calorie malnutrition, severe  Total days of antibiotics: 6 vanco/aztreonam  epeat BCx 7-21 pending. 1/2+. Will recheck.  For IR drain of abscess when BP is stable.   TEE pending   Greatly appreciate WOC, surgical f/u      [name] Infectious Diseases (pager) [phone] [email or web address] 05/02/2015, 4:14 PM  LOS: 7 days

## 2015-05-02 NOTE — Progress Notes (Signed)
Clinical Social Work  CSW received call from unit reporting that APS worker was in unit and needed to speak with CSW. CSW met with APS worker Eliezer Bottom [phone]) who reports she is investing allegations. CSW and charge RN spoke with APS worker who reports she will visit with patient and speak with mother as well. CSW placed APS notice on chart in case further information is needed. APS asked to be kept updated on DC plans.  Sindy Messing, LCSW (Coverage for [employer])

## 2015-05-02 NOTE — Progress Notes (Signed)
3 Days Post-Op  Subjective: S/p debridement of decubitus ulcer at bedside by Dr. Zella Richer.  S/p debridement of heel by ortho.  Pt sleeping.  Per caregiver, has not had significant change in pain.    Objective: Vital signs in last 24 hours: Temp:  [96.9 F (36.1 C)-98.6 F (37 C)] 97.8 F (36.6 C) (07/25 0800) Resp:  [7-24] 14 (07/25 0805) BP: (85-140)/(49-100) 134/82 mmHg (07/25 0755) SpO2:  [100 %] 100 % (07/25 0620) Arterial Line BP: (65-150)/(24-74) 105/44 mmHg (07/25 0805) Last BM Date: 05/01/15  Intake/Output from previous day: 07/24 0701 - 07/25 0700 In: 3230.4 [I.V.:2930.4; IV Piggyback:300] Out: 2200 [Urine:1175; Stool:1025] Intake/Output this shift: Total I/O In: 91.9 [I.V.:91.9] Out: -   General appearance: sleeping comfortably Resp: no respiratory distress  Lab Results:   Recent Labs  05/01/15 0410 05/02/15 0536  WBC 26.0* 31.3*  HGB 8.2* 8.5*  HCT 25.9* 26.4*  PLT 600* 535*   BMET  Recent Labs  05/01/15 0410 05/02/15 0536  NA 138 134*  K 3.7 4.3  CL 122* 120*  CO2 14* 13*  GLUCOSE 102* 303*  BUN 12 14  CREATININE 0.52* 0.56*  CALCIUM 7.3* 7.2*   PT/INR No results for input(s): LABPROT, INR in the last 72 hours. ABG No results for input(s): PHART, HCO3 in the last 72 hours.  Invalid input(s): PCO2, PO2  Studies/Results: No results found.  Anti-infectives: Anti-infectives    Start     Dose/Rate Route Frequency Ordered Stop   04/28/15 2000  vancomycin (VANCOCIN) IVPB 750 mg/150 ml premix     750 mg 150 mL/hr over 60 Minutes Intravenous Every 24 hours 04/28/15 0731     04/26/15 0800  vancomycin (VANCOCIN) IVPB 750 mg/150 ml premix  Status:  Discontinued     750 mg 150 mL/hr over 60 Minutes Intravenous Every 12 hours 04/25/15 1715 04/28/15 0731   04/26/15 0200  aztreonam (AZACTAM) 1 g in dextrose 5 % 50 mL IVPB     1 g 100 mL/hr over 30 Minutes Intravenous Every 8 hours 04/25/15 1919     04/25/15 1715  vancomycin (VANCOCIN) IVPB 750  mg/150 ml premix     750 mg 150 mL/hr over 60 Minutes Intravenous STAT 04/25/15 1715 04/25/15 1906   04/25/15 1645  aztreonam (AZACTAM) 2 g in dextrose 5 % 50 mL IVPB     2 g 100 mL/hr over 30 Minutes Intravenous  Once 04/25/15 1643 04/25/15 1807      Assessment/Plan: s/p Procedure(s)  continue antibiotics per primary team.  Perc aspiration of obturator abscess scheduled today with IR.     LOS: 7 days    [facility] 05/02/2015

## 2015-05-02 NOTE — Clinical Documentation Improvement (Signed)
Please clarify if debridement was excisional or nonexcisional in nature.  . Type of debridement --Excisional/Sharp --Non-excisional    Thank [name] Coombs, RN, CDI [phone] [place].

## 2015-05-03 DIAGNOSIS — I959 Hypotension, unspecified: Secondary | ICD-10-CM

## 2015-05-03 LAB — GLUCOSE, CAPILLARY
GLUCOSE-CAPILLARY: 263 mg/dL — AB (ref 65–99)
Glucose-Capillary: 215 mg/dL — ABNORMAL HIGH (ref 65–99)
Glucose-Capillary: 206 mg/dL — ABNORMAL HIGH (ref 65–99)
Glucose-Capillary: 152 mg/dL — ABNORMAL HIGH (ref 65–99)
Glucose-Capillary: 162 mg/dL — ABNORMAL HIGH (ref 65–99)

## 2015-05-03 LAB — CULTURE, BLOOD (ROUTINE X 2): Culture: NO GROWTH

## 2015-05-03 LAB — CBC WITH DIFFERENTIAL/PLATELET
Basophils Absolute: 0 K/uL (ref 0.0–0.1)
Basophils Relative: 0 % (ref 0–1)
Eosinophils Absolute: 0 K/uL (ref 0.0–0.7)
Eosinophils Relative: 0 % (ref 0–5)
HCT: 26.3 % — ABNORMAL LOW (ref 39.0–52.0)
Hemoglobin: 8.3 g/dL — ABNORMAL LOW (ref 13.0–17.0)
Lymphocytes Relative: 7 % — ABNORMAL LOW (ref 12–46)
Lymphs Abs: 3.4 K/uL (ref 0.7–4.0)
MCH: 25.5 pg — ABNORMAL LOW (ref 26.0–34.0)
MCHC: 31.6 g/dL (ref 30.0–36.0)
MCV: 80.9 fL (ref 78.0–100.0)
Monocytes Absolute: 2.9 K/uL — ABNORMAL HIGH (ref 0.1–1.0)
Monocytes Relative: 6 % (ref 3–12)
Neutro Abs: 42.3 K/uL — ABNORMAL HIGH (ref 1.7–7.7)
Neutrophils Relative %: 87 % — ABNORMAL HIGH (ref 43–77)
Platelets: 672 K/uL — ABNORMAL HIGH (ref 150–400)
RBC: 3.25 MIL/uL — ABNORMAL LOW (ref 4.22–5.81)
RDW: 17.4 % — ABNORMAL HIGH (ref 11.5–15.5)
WBC: 48.6 K/uL — ABNORMAL HIGH (ref 4.0–10.5)

## 2015-05-03 LAB — BASIC METABOLIC PANEL
Anion gap: 3 — ABNORMAL LOW (ref 5–15)
BUN: 16 mg/dL (ref 6–20)
CALCIUM: 7.4 mg/dL — AB (ref 8.9–10.3)
CO2: 14 mmol/L — AB (ref 22–32)
CREATININE: 0.58 mg/dL — AB (ref 0.61–1.24)
Chloride: 116 mmol/L — ABNORMAL HIGH (ref 101–111)
GLUCOSE: 287 mg/dL — AB (ref 65–99)
Potassium: 3.9 mmol/L (ref 3.5–5.1)
Sodium: 133 mmol/L — ABNORMAL LOW (ref 135–145)

## 2015-05-03 LAB — MAGNESIUM: Magnesium: 1.6 mg/dL — ABNORMAL LOW (ref 1.7–2.4)

## 2015-05-03 MED ORDER — INSULIN GLARGINE 100 UNIT/ML ~~LOC~~ SOLN
15.0000 [IU] | Freq: Every day | SUBCUTANEOUS | Status: DC
  Administered 2015-05-04 – 2015-05-06 (×2): 15 [IU] via SUBCUTANEOUS
  Filled 2015-05-03 (×6): qty 0.15

## 2015-05-03 MED ORDER — INSULIN GLARGINE 100 UNIT/ML ~~LOC~~ SOLN
10.0000 [IU] | Freq: Once | SUBCUTANEOUS | Status: AC
  Administered 2015-05-03: 10 [IU] via SUBCUTANEOUS
  Filled 2015-05-03: qty 0.1

## 2015-05-03 MED ORDER — METOCLOPRAMIDE HCL 5 MG/ML IJ SOLN
10.0000 mg | Freq: Once | INTRAMUSCULAR | Status: AC
Start: 2015-05-03 — End: 2015-05-03
  Administered 2015-05-03: 10 mg via INTRAVENOUS
  Filled 2015-05-03: qty 2

## 2015-05-03 MED ORDER — INSULIN GLARGINE 100 UNIT/ML ~~LOC~~ SOLN
15.0000 [IU] | Freq: Every day | SUBCUTANEOUS | Status: DC
Start: 2015-05-03 — End: 2015-05-03
  Filled 2015-05-03: qty 0.15

## 2015-05-03 MED ORDER — MAGNESIUM SULFATE 2 GM/50ML IV SOLN
2.0000 g | Freq: Once | INTRAVENOUS | Status: AC
  Administered 2015-05-03: 2 g via INTRAVENOUS
  Filled 2015-05-03: qty 50

## 2015-05-03 NOTE — Progress Notes (Signed)
Patient ID: Jason [name], male   DOB: 09-10-1986, 29 y.o.   MRN: [id number] Patient had his wound VAC replaced.  No other surgical debridement required.  Will defer further care of this wound to his primary surgeon, Dr. Migdalia Dk.  We will sign off.  Maury Groninger E 8:45 AM 05/03/2015

## 2015-05-03 NOTE — Progress Notes (Signed)
PULMONARY / CRITICAL CARE MEDICINE   Name: Jason [name] MRN: [id number] DOB: [birth year]    ADMISSION DATE:  04/25/2015 CONSULTATION DATE:  05/01/2015  REFERRING MD :  TRH  CHIEF COMPLAINT:  Hypotension  INITIAL PRESENTATION: 29 year old male quad with MRSA bacteremia from multiple sources.  Concern for endocarditis.  Wounds have been checked and followed by general surgery and ortho.  Patient has history of hypotension and concern for septic shock and levophed was started and PCCM consulted.   SUBJECTIVE:  Patient started on bicarbonate drip yesterday evening. According to RN patient has been off of vasopressor support since yesterday evening. Patient reports pain is 9/10 today. Denies any nausea or vomiting. Denies any chest pain or pressure. Denies any subjective fever, chills, or sweats. Transesophageal echocardiogram and CT guided abscess drainage were canceled yesterday due to vasopressor requirement. This is a concern for his mother.  ROS:  No dyspnea or cough. No headache or vision changes.  VITAL SIGNS: Temp:  [97 F (36.1 C)-97.8 F (36.6 C)] 97.4 F (36.3 C) (07/26 0315) Resp:  [9-19] 18 (07/26 0800) BP: (78-112)/(38-75) 112/75 mmHg (07/25 1440) SpO2:  [100 %] 100 % (07/26 0700) Arterial Line BP: (71-129)/(29-56) 82/45 mmHg (07/26 0800) HEMODYNAMICS:   VENTILATOR SETTINGS:   INTAKE / OUTPUT:  Intake/Output Summary (Last 24 hours) at 05/03/15 0844 Last data filed at 05/03/15 0700  Gross per 24 hour  Intake 2206.6 ml  Output    775 ml  Net 1431.6 ml    PHYSICAL EXAMINATION: General:  Comfortable sitting up in bed watching TV. No acute distress. Alert. Mother living in chair at bedside. Neuro:  Able to move upper extremities and follows commands. Answering questions appropriately. Incomplete quadriplegia.  HEENT:  No scleral icterus. Moist mucus membranes. Pupils equal. Cardiovascular:  Regular rate. No appreciable JVD.  Lungs:  Improved aeration bilateral  lung bases. Normal work of breathing. No accessory muscle use. Abdomen:  Soft. Nondistended. Nontender. Ostomy in place. Musculoskeletal:  Does have joint contractions. No appreciable joint effusions. Skin:  Warm & dry. No rash appreciated. Dressings in place.  LABS:  CBC  Recent Labs Lab 05/01/15 0410 05/02/15 0536 05/03/15 0440  WBC 26.0* 31.3* 48.6*  HGB 8.2* 8.5* 8.3*  HCT 25.9* 26.4* 26.3*  PLT 600* 535* 672*   Coag's No results for input(s): APTT, INR in the last 168 hours. BMET  Recent Labs Lab 05/01/15 0410 05/02/15 0536 05/03/15 0440  NA 138 134* 133*  K 3.7 4.3 3.9  CL 122* 120* 116*  CO2 14* 13* 14*  BUN 12 14 16   CREATININE 0.52* 0.56* 0.58*  GLUCOSE 102* 303* 287*   Electrolytes  Recent Labs Lab 04/30/15 0440 05/01/15 0410 05/02/15 0536 05/03/15 0440  CALCIUM 7.5* 7.3* 7.2* 7.4*  MG 1.1*  --  1.1* 1.6*  PHOS  --   --  3.2  --    Sepsis Markers No results for input(s): LATICACIDVEN, PROCALCITON, O2SATVEN in the last 168 hours. ABG No results for input(s): PHART, PCO2ART, PO2ART in the last 168 hours. Liver Enzymes  Recent Labs Lab 04/27/15 0815 04/28/15 0640  AST 12* 8*  ALT 7* 7*  ALKPHOS 206* 173*  BILITOT 0.5 0.4  ALBUMIN 1.2* 1.1*   Cardiac Enzymes No results for input(s): TROPONINI, PROBNP in the last 168 hours. Glucose  Recent Labs Lab 05/01/15 1648 05/01/15 2231 05/02/15 0733 05/02/15 1130 05/02/15 2144 05/03/15 0744  GLUCAP 171* 152* 278* 238* 215* 263*   Imaging Ct Pelvis W  Contrast  05/02/2015   CLINICAL DATA:  Pelvic abscess.  EXAM: CT PELVIS WITH CONTRAST  TECHNIQUE: Multidetector CT imaging of the pelvis was performed using the standard protocol following the bolus administration of intravenous contrast.  CONTRAST:  134mL OMNIPAQUE IOHEXOL 300 MG/ML  SOLN  COMPARISON:  MRI scan of April 28, 2015; CT scan of December 11, 2014.  FINDINGS: The patient appears to be status post surgical resection of the distal sacrum and  coccyx. Overlying sacral decubitus ulcer is noted. Right buttocks ulceration is seen extending to the ischial tuberosity, where some lucency is noted suggesting osteomyelitis. Moderate ascites is noted. Ostomy is noted in left lower quadrant. Suprapubic catheter is noted within the urinary bladder. There is no evidence of bowel obstruction. The appendix appears normal. Moderate soft tissue swelling or anasarca is noted in the visualized soft tissues. Two fluid collections are noted along the right side of the pelvic wall just medial to right acetabulum ; 1 measures 27 x 13 mm superiorly. The other measures 36 x 12 mm more inferiorly. These appear to communicate posteriorly.  IMPRESSION: Moderate ascites.  Moderate anasarca is noted.  Status post surgical resection of distal sacrum and coccyx with overlying sacral decubitus ulcer.  Large deep ulceration is seen involving right buttocks area which extends to the cortex of the right ischial tuberosity, where some lucency is noted suggesting osteomyelitis. Two abscesses are noted in this area along the medial portion of the right acetabulum which appear to communicate posteriorly.   Electronically Signed   By: Marijo Conception, M.D.   On: 05/02/2015 14:47    ASSESSMENT / PLAN:  PULMONARY Trach, chronic. A:  No Acute Issues  P:   O2 as needed to maintain saturation greater than 90%. Monitor for airway protection.  CARDIOVASCULAR Right Arm PICC 7/19>>> Left Radial Arterial Line 7/24>>> A:  Hypotension chronic in nature.    P:  Levophed titrate for SBP >80 & MAP >55 - off since yesterday evening. Continue Midodrine 20mg  PO TID   RENAL A:   UOP adequate.  Hyperchloremia >> improving. Hypomagnesemia  P:   S/P Magnesium Sulfate 2gm this AM Repeat Mg level in AM 7/26 Monitoring electrolytes daily Currently on serum bicarbonate drip at 75 mL per hour  GASTROINTESTINAL A:   No acute issues.  P:   Diet as ordered.    HEMATOLOGIC A:   Leukocytosis >> Worsened likely secondary to steroids & sepsis. Anemia - no signs of active bleeding.  P:  Trend hemoglobin with CBC in a.m. Trending leukocytosis Transfuse per ICU protocol.  INFECTIOUS A:  MRSA bacteremia.   P:   BCx2 MRSA TEE ordered & pending  IR drainage of abscessordered & pending   Vancomycin 7/18>>> Aztreonam  7/18>>>  ENDOCRINE A:   Possible adrenal insufficiency >> Cortisol low but no response in BP to stress dosed steroids. Diabetes mellitus - hyperglycemia likely to improve off of hydrocortisone   P:   Continuing Lantus  Continuing sliding scale insulin with Accu-Cheks    NEUROLOGIC A:  Quad post trauma.  P:   Continuing IV Dilaudid when necessary   I have spent a total of 34 minutes of critical care time reviewing the patient's EMR, updating the patient's mother, discussing the plan of care with the patient's nurse and caring for this patient today.  Sonia Baller Ashok Cordia, M.D. [facility] Pulmonary & Critical Care Pager:  [phone] After 3pm or if no response, call [phone]   05/03/2015, 8:44 AM

## 2015-05-03 NOTE — Progress Notes (Signed)
RN called RT to look at arterial line because patient was complaining about site hurting. When RT arrived the arterial line was out. RT asked the nurse if the arterial line pressure was coinside with the bllod pressure and it was. Asked RN to let RT know if another aline will be needed

## 2015-05-03 NOTE — Progress Notes (Signed)
Interventional Radiology Progress Note  29yo male with a history of spinal cord injury, has known sacral decubitus ulcer.  Receiving wound care with Dr. Theodoro [facility].   Admitted for hypotension, and blood cultures positive for bacteremia.  Imaging with MR and CT show the sacral decubitus ulcer in communication with fluid along the internal pelvis, with clear communication of air on CT with the base of the decubitus ulcer.  MR also shows evidence of osteomyelitis.    Discussed care with Dr. Migdalia Dk [facility]) directly, as well as with [facility], and primary physician Dr. Karleen Hampshire.  At present, there is evidence that the fluid in the pelvic communicates externally, and a wound VAC is in place.  The only route of of percutaneous access is through the base of the ulcer itself, with the same trajectory as any surgical debridement/exposure would offer.    For now would agree with continuing ABX, and monitoring for response.  Repeat imaging after trial of ABX and wound VAC may be useful.   Call with questions/concerns.  Signed,  Dulcy Fanny. Earleen Newport, DO

## 2015-05-03 NOTE — Progress Notes (Signed)
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 He was admitted for further evaluation of sepsis. His blood cultures revealed MRSA and has been on vancomycin. ID on board.  MRI of the pelvis revealed a small RIGHT internal obturator abscess and initial plan was  to undergo CT guided aspiration by IR. Dr Earleen Newport spoke to Dr Migdalia Dk, who have decided to watch him on IV Antibiotics for now.  Assessment/Plan: 1. CXR findings of bibasilar infiltrate concerning for atypical pneumonia, without any symptomatology of pneumonia. :  Started on on IV vancomycin and IV azactum. Blood cultures from 7/18 showed MRSA  Sensitive to vancomycin and tetracyclines.  Repeat cultures from 7/21 also showed gram positive cocci. Repeat cultures ordered, but could not be done as he was extremely difficult stick. Plan to request the IV team to see if they can draw blood cultures Appreciate infectious disease Dr Algis Downs input.      2. MRSA bacteremia:  On IV vancomycin and IV azactum  Requested cardiology for TEE. Initially scheduled for TEE ON 7/22, but since he was on levophed , it could not be done. He has been off levophed for 24 hours now, please request cardiology for TEE in the next day or two.   Sacral decubitus ulcer/ right internal obturator abscess: Wound care following.  MRI of the sacrum, pelvis and left foot ordered as per recommendations of ID. Results shows chronic osteomyelitis and right internal obturator abscess extending fromt he right ischial tuberosity decubitus ulcer. No osteomyelitis of the left foot heel.  Surgery consulted and recommendations given. He underwent bedside debridement.  Orthopedics  consulted for  further recommendations, recommended IR consult for aspiration of the abscess.  IR recommended to continue the abx and monitor for response and rpeat imaging once abx are completed.    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  He resides at home and mother is the caregiver.    UTI ? Vs colonization: -urine cultures show E coli on appropriate antibiotics.   Type 1 DM: CBG (last 3)   Recent Labs  05/03/15 0744 05/03/15 1132 05/03/15 1650  GLUCAP 263* 206* 152*   cbg's are improving. His hemoglobin a1c is 8.3, started him on small dose of lantus and SSI. Increase lantus to 15 units daily.   Chronic hypotension:  On midodrine 20 mg TID,but has required levophed on and off  For the last one week until yesterday morning, where we stopped the levophed.  Pccm was consulted for management of sepsis from MRSA bacteremia.   Thrombocytosis: reactive.    Non anion gap metabolic acidosis: Will start him on bicarbonate drip.   Hypokalemia repleted as needed.    Hypomagnesemia:  - replete as needed and repeat in am.      Code Status: FULL CODE.  Family Communication: mother at bedside Disposition Plan: pending further work up.    Consultants:  INFECTIOUS DISEASE.   ORTHOPEDICS  SURGERY  IR  Procedures:  MRI of the pelvis / sacrum  MRI left foot  TEE to be scheduled.   Antibiotics:  Vancomycin   Aztreonam.  HPI/Subjective: REPORTS pain not well controlled. incrased the fentanyl to 50 mcg.  Objective: Filed Vitals:   05/03/15 1700  BP:   Pulse:   Temp:   Resp: 15  Intake/Output Summary (Last 24 hours) at 05/03/15 1746 Last data filed at 05/03/15 1723  Gross per 24 hour  Intake 2822.5 ml  Output    900 ml  Net 1922.5 ml   Filed Weights   04/29/15 0545 04/30/15 0400 05/01/15 0427  Weight: 59.4 kg (130 lb 15.3 oz) 61.1 kg (134 lb 11.2 oz) 63.4 kg (139 lb 12.4 oz)    Exam:   General:  Lethargic,but answering questions appropriately, reports  pain is not well controlled.   Cardiovascular: s1s2  Respiratory: diminished at bases, no wheezing or rhonchi  Abdomen: soft non tender non distended bowel sounds heard  Musculoskeletal: no edema , left foot ulcer on heel.   Data Reviewed: Basic Metabolic Panel:  Recent Labs Lab 04/29/15 0352 04/30/15 0440 05/01/15 0410 05/02/15 0536 05/03/15 0440  NA 134* 135 138 134* 133*  K 3.8 3.1* 3.7 4.3 3.9  CL 116* 119* 122* 120* 116*  CO2 14* 13* 14* 13* 14*  GLUCOSE 394* 178* 102* 303* 287*  BUN 13 12 12 14 16   CREATININE 0.47* 0.46* 0.52* 0.56* 0.58*  CALCIUM 7.8* 7.5* 7.3* 7.2* 7.4*  MG  --  1.1*  --  1.1* 1.6*  PHOS  --   --   --  3.2  --    Liver Function Tests:  Recent Labs Lab 04/27/15 0815 04/28/15 0640  AST 12* 8*  ALT 7* 7*  ALKPHOS 206* 173*  BILITOT 0.5 0.4  PROT 6.0* 5.3*  ALBUMIN 1.2* 1.1*   No results for input(s): LIPASE, AMYLASE in the last 168 hours. No results for input(s): AMMONIA in the last 168 hours. CBC:  Recent Labs Lab 04/29/15 0352 04/30/15 0440 05/01/15 0410 05/02/15 0536 05/03/15 0440  WBC 34.6* 33.6* 26.0* 31.3* 48.6*  NEUTROABS 29.5* 27.6* 19.8* 29.4* 42.3*  HGB 10.2* 8.6* 8.2* 8.5* 8.3*  HCT 32.3* 28.4* 25.9* 26.4* 26.3*  MCV 80.1 80.0 81.2 80.7 80.9  PLT 726* 648* 600* 535* 672*   Cardiac Enzymes: No results for input(s): CKTOTAL, CKMB, CKMBINDEX, TROPONINI in the last 168 hours. BNP (last 3 results) No results for input(s): BNP in the last 8760 hours.  ProBNP (last 3 results) No results for input(s): PROBNP in the last 8760 hours.  CBG:  Recent Labs Lab 05/02/15 1130 05/02/15 2144 05/03/15 0744 05/03/15 1132 05/03/15 1650  GLUCAP 238* 215* 263* 206* 152*    Recent Results (from the past 240 hour(s))  Urine culture     Status: None   Collection Time: 04/25/15  4:35 PM  Result Value Ref Range Status   Specimen Description URINE, CLEAN CATCH  Final   Special Requests Normal  Final   Culture   Final     >=100,000 COLONIES/mL ESCHERICHIA COLI AZTREONAM SENSITIVE ZONE 32MM Performed at [facility] Performed at [facility]    Report Status 05/02/2015 FINAL  Final   Organism ID, Bacteria ESCHERICHIA COLI  Final      Susceptibility   Escherichia coli - MIC*    AMPICILLIN <=2 SENSITIVE Sensitive     CEFAZOLIN <=4 SENSITIVE Sensitive     CEFTRIAXONE <=1 SENSITIVE Sensitive     CIPROFLOXACIN >=4 RESISTANT Resistant     GENTAMICIN <=1 SENSITIVE Sensitive     IMIPENEM <=0.25 SENSITIVE Sensitive     NITROFURANTOIN <=16 SENSITIVE Sensitive     TRIMETH/SULFA <=20 SENSITIVE Sensitive     AMPICILLIN/SULBACTAM <=2 SENSITIVE Sensitive     PIP/TAZO <=4 SENSITIVE Sensitive     * >=100,000 COLONIES/mL  ESCHERICHIA COLI  Blood culture (routine x 2)     Status: None   Collection Time: 04/25/15  5:00 PM  Result Value Ref Range Status   Specimen Description BLOOD RIGHT ANTECUBITAL  Final   Special Requests BOTTLES DRAWN AEROBIC AND ANAEROBIC 5 CC EA  Final   Culture  Setup Time   Final    GRAM POSITIVE COCCI IN CLUSTERS Gram Stain Report Called to,Read Back By and Verified With: Elveria Rising RN 17:30 04/26/15 ([email or web address]) IN BOTH AEROBIC AND ANAEROBIC BOTTLES    Culture   Final    METHICILLIN RESISTANT STAPHYLOCOCCUS AUREUS MICROAEROPHILIC STREPTOCOCCI Standardized susceptibility testing for this organism is not available. Performed at [facility]    Report Status 04/30/2015 FINAL  Final   Organism ID, Bacteria METHICILLIN RESISTANT STAPHYLOCOCCUS AUREUS  Final      Susceptibility   Methicillin resistant staphylococcus aureus - MIC*    CIPROFLOXACIN >=8 RESISTANT Resistant     ERYTHROMYCIN >=8 RESISTANT Resistant     GENTAMICIN <=0.5 SENSITIVE Sensitive     OXACILLIN >=4 RESISTANT Resistant     TETRACYCLINE <=1 SENSITIVE Sensitive     VANCOMYCIN <=0.5 SENSITIVE Sensitive     TRIMETH/SULFA >=320 RESISTANT Resistant     CLINDAMYCIN >=8 RESISTANT Resistant     RIFAMPIN <=0.5  SENSITIVE Sensitive     Inducible Clindamycin NEGATIVE Sensitive     * METHICILLIN RESISTANT STAPHYLOCOCCUS AUREUS  Blood culture (routine x 2)     Status: None   Collection Time: 04/25/15  5:16 PM  Result Value Ref Range Status   Specimen Description BLOOD BLOOD RIGHT FOREARM  Final   Special Requests BOTTLES DRAWN AEROBIC AND ANAEROBIC 5 CC EA  Final   Culture  Setup Time   Final    GRAM POSITIVE COCCI IN CLUSTERS CRITICAL RESULT CALLED TO, READ BACK BY AND VERIFIED WITH: S GRAHAM 04/26/15 @ 62 M VESTAL IN BOTH AEROBIC AND ANAEROBIC BOTTLES    Culture   Final    METHICILLIN RESISTANT STAPHYLOCOCCUS AUREUS SUSCEPTIBILITIES PERFORMED ON PREVIOUS CULTURE WITHIN THE LAST 5 DAYS. MICROAEROPHILIC STREPTOCOCCI Standardized susceptibility testing for this organism is not available. Performed at [facility] [place]    Report Status 05/03/2015 FINAL  Final  MRSA PCR Screening     Status: Abnormal   Collection Time: 04/25/15  6:55 PM  Result Value Ref Range Status   MRSA by PCR POSITIVE (A) NEGATIVE Final    Comment:        The GeneXpert MRSA Assay (FDA approved for NASAL specimens only), is one component of a comprehensive MRSA colonization surveillance program. It is not intended to diagnose MRSA infection nor to guide or monitor treatment for MRSA infections. RESULT CALLED TO, READ BACK BY AND VERIFIED WITH: Sharyn Lull REEVES,RN [id number] @ 2043 BY J SCOTTON   Culture, blood (routine x 2)     Status: None   Collection Time: 04/28/15  1:20 PM  Result Value Ref Range Status   Specimen Description BLOOD LEFT ARM  Final   Special Requests IN PEDIATRIC BOTTLE 1CC  Final   Culture  Setup Time   Final    GRAM POSITIVE COCCI IN CLUSTERS AEROBIC BOTTLE ONLY CRITICAL RESULT CALLED TO, READ BACK BY AND VERIFIED WITH: Hetty Blend RN 2222 04/30/15 A BROWNING    Culture   Final    MICROAEROPHILIC STREPTOCOCCI Standardized susceptibility testing for this organism is not available. Performed  at [facility]    Report Status 05/03/2015 FINAL  Final  Culture, blood (routine x 2)     Status: None   Collection Time: 04/28/15  5:50 PM  Result Value Ref Range Status   Specimen Description BLOOD PICC LINE  Final   Special Requests BOTTLES DRAWN AEROBIC AND ANAEROBIC 5CC EACH  Final   Culture   Final    NO GROWTH 5 DAYS Performed at [facility]    Report Status 05/03/2015 FINAL  Final     Studies: Ct Pelvis W Contrast  05/02/2015   CLINICAL DATA:  Pelvic abscess.  EXAM: CT PELVIS WITH CONTRAST  TECHNIQUE: Multidetector CT imaging of the pelvis was performed using the standard protocol following the bolus administration of intravenous contrast.  CONTRAST:  149mL OMNIPAQUE IOHEXOL 300 MG/ML  SOLN  COMPARISON:  MRI scan of April 28, 2015; CT scan of December 11, 2014.  FINDINGS: The patient appears to be status post surgical resection of the distal sacrum and coccyx. Overlying sacral decubitus ulcer is noted. Right buttocks ulceration is seen extending to the ischial tuberosity, where some lucency is noted suggesting osteomyelitis. Moderate ascites is noted. Ostomy is noted in left lower quadrant. Suprapubic catheter is noted within the urinary bladder. There is no evidence of bowel obstruction. The appendix appears normal. Moderate soft tissue swelling or anasarca is noted in the visualized soft tissues. Two fluid collections are noted along the right side of the pelvic wall just medial to right acetabulum ; 1 measures 27 x 13 mm superiorly. The other measures 36 x 12 mm more inferiorly. These appear to communicate posteriorly.  IMPRESSION: Moderate ascites.  Moderate anasarca is noted.  Status post surgical resection of distal sacrum and coccyx with overlying sacral decubitus ulcer.  Large deep ulceration is seen involving right buttocks area which extends to the cortex of the right ischial tuberosity, where some lucency is noted suggesting osteomyelitis. Two abscesses are noted in  this area along the medial portion of the right acetabulum which appear to communicate posteriorly.   Electronically Signed   By: Marijo Conception, M.D.   On: 05/02/2015 14:47    Scheduled Meds: . apixaban  5 mg Oral BID  . aztreonam  1 g Intravenous Q8H  . baclofen  10 mg Oral BID WC  . baclofen  20 mg Oral QHS  . feeding supplement (ENSURE ENLIVE)  237 mL Oral BID BM  . feeding supplement (GLUCERNA SHAKE)  237 mL Oral TID BM  . feeding supplement (PRO-STAT SUGAR FREE 64)  30 mL Oral TID BM  . [START ON 05/04/2015] fentaNYL  50 mcg Transdermal Q72H  . insulin aspart  0-15 Units Subcutaneous TID WC  . [START ON 05/04/2015] insulin glargine  15 Units Subcutaneous Daily  . loratadine  10 mg Oral QHS  . midodrine  20 mg Oral TID WC  . mirtazapine  7.5 mg Oral QHS  . pregabalin  100 mg Oral TID AC  . pregabalin  200 mg Oral QHS  . sodium chloride  10-40 mL Intracatheter Q12H  . vancomycin  750 mg Intravenous Q24H   Continuous Infusions: . sodium chloride    . norepinephrine (LEVOPHED) Adult infusion Stopped (05/02/15 1520)  .  sodium bicarbonate  infusion 1000 mL 75 mL/hr at 05/02/15 1500    Active Problems:   Type 1 diabetes, uncontrolled, with neuropathy   Spinal cord infarction (history of)   Sepsis   Stage IV decubitus ulcer   Urinary tract infectious disease   HCAP (healthcare-associated pneumonia)   Hypotension  Time spent:35 min    Daina Cara  [facility] Pager [phone]  If 7PM-7AM, please contact night-coverage at [email or web address], password [facility] 05/03/2015, 5:46 PM  LOS: 8 days

## 2015-05-03 NOTE — Progress Notes (Signed)
INFECTIOUS DISEASE PROGRESS NOTE  ID: Jason [name] is a 29 y.o. male with  Active Problems:   Type 1 diabetes, uncontrolled, with neuropathy   Spinal cord infarction (history of)   Sepsis   Stage IV decubitus ulcer   Urinary tract infectious disease   HCAP (healthcare-associated pneumonia)   Hypotension  Subjective: Resting quietly.   Abtx:  Anti-infectives    Start     Dose/Rate Route Frequency Ordered Stop   04/28/15 2000  vancomycin (VANCOCIN) IVPB 750 mg/150 ml premix     750 mg 150 mL/hr over 60 Minutes Intravenous Every 24 hours 04/28/15 0731     04/26/15 0800  vancomycin (VANCOCIN) IVPB 750 mg/150 ml premix  Status:  Discontinued     750 mg 150 mL/hr over 60 Minutes Intravenous Every 12 hours 04/25/15 1715 04/28/15 0731   04/26/15 0200  aztreonam (AZACTAM) 1 g in dextrose 5 % 50 mL IVPB     1 g 100 mL/hr over 30 Minutes Intravenous Every 8 hours 04/25/15 1919     04/25/15 1715  vancomycin (VANCOCIN) IVPB 750 mg/150 ml premix     750 mg 150 mL/hr over 60 Minutes Intravenous STAT 04/25/15 1715 04/25/15 1906   04/25/15 1645  aztreonam (AZACTAM) 2 g in dextrose 5 % 50 mL IVPB     2 g 100 mL/hr over 30 Minutes Intravenous  Once 04/25/15 1643 04/25/15 1807      Medications:  Scheduled: . apixaban  5 mg Oral BID  . aztreonam  1 g Intravenous Q8H  . baclofen  10 mg Oral BID WC  . baclofen  20 mg Oral QHS  . feeding supplement (ENSURE ENLIVE)  237 mL Oral BID BM  . feeding supplement (GLUCERNA SHAKE)  237 mL Oral TID BM  . feeding supplement (PRO-STAT SUGAR FREE 64)  30 mL Oral TID BM  . [START ON 05/04/2015] fentaNYL  50 mcg Transdermal Q72H  . insulin aspart  0-15 Units Subcutaneous TID WC  . [START ON 05/04/2015] insulin glargine  15 Units Subcutaneous Daily  . loratadine  10 mg Oral QHS  . midodrine  20 mg Oral TID WC  . mirtazapine  7.5 mg Oral QHS  . pregabalin  100 mg Oral TID AC  . pregabalin  200 mg Oral QHS  . sodium chloride  10-40 mL Intracatheter  Q12H  . vancomycin  750 mg Intravenous Q24H    Objective: Vital signs in last 24 hours: Temp:  [97 F (36.1 C)-97.6 F (36.4 C)] 97.1 F (36.2 C) (07/26 1542) Resp:  [9-23] 18 (07/26 1500) SpO2:  [94 %-100 %] 95 % (07/26 1500) Arterial Line BP: (65-107)/(31-56) 87/45 mmHg (07/26 1500)   General appearance: no distress Resp: rhonchi anterior - right Cardio: regular rate and rhythm GI: normal findings: bowel sounds normal and soft, non-tender Extremities: L heel wrapped.   Lab Results  Recent Labs  05/02/15 0536 05/03/15 0440  WBC 31.3* 48.6*  HGB 8.5* 8.3*  HCT 26.4* 26.3*  NA 134* 133*  K 4.3 3.9  CL 120* 116*  CO2 13* 14*  BUN 14 16  CREATININE 0.56* 0.58*   Liver Panel No results for input(s): PROT, ALBUMIN, AST, ALT, ALKPHOS, BILITOT, BILIDIR, IBILI in the last 72 hours. Sedimentation Rate No results for input(s): ESRSEDRATE in the last 72 hours. C-Reactive Protein No results for input(s): CRP in the last 72 hours.  Microbiology: Recent Results (from the past 240 hour(s))  Urine culture  Status: None   Collection Time: 04/25/15  4:35 PM  Result Value Ref Range Status   Specimen Description URINE, CLEAN CATCH  Final   Special Requests Normal  Final   Culture   Final    >=100,000 COLONIES/mL ESCHERICHIA COLI AZTREONAM SENSITIVE ZONE 32MM Performed at [facility] Performed at [facility]    Report Status 05/02/2015 FINAL  Final   Organism ID, Bacteria ESCHERICHIA COLI  Final      Susceptibility   Escherichia coli - MIC*    AMPICILLIN <=2 SENSITIVE Sensitive     CEFAZOLIN <=4 SENSITIVE Sensitive     CEFTRIAXONE <=1 SENSITIVE Sensitive     CIPROFLOXACIN >=4 RESISTANT Resistant     GENTAMICIN <=1 SENSITIVE Sensitive     IMIPENEM <=0.25 SENSITIVE Sensitive     NITROFURANTOIN <=16 SENSITIVE Sensitive     TRIMETH/SULFA <=20 SENSITIVE Sensitive     AMPICILLIN/SULBACTAM <=2 SENSITIVE Sensitive     PIP/TAZO <=4 SENSITIVE Sensitive     *  >=100,000 COLONIES/mL ESCHERICHIA COLI  Blood culture (routine x 2)     Status: None   Collection Time: 04/25/15  5:00 PM  Result Value Ref Range Status   Specimen Description BLOOD RIGHT ANTECUBITAL  Final   Special Requests BOTTLES DRAWN AEROBIC AND ANAEROBIC 5 CC EA  Final   Culture  Setup Time   Final    GRAM POSITIVE COCCI IN CLUSTERS Gram Stain Report Called to,Read Back By and Verified With: Elveria Rising RN 17:30 04/26/15 ([email or web address]) IN BOTH AEROBIC AND ANAEROBIC BOTTLES    Culture   Final    METHICILLIN RESISTANT STAPHYLOCOCCUS AUREUS MICROAEROPHILIC STREPTOCOCCI Standardized susceptibility testing for this organism is not available. Performed at [facility]    Report Status 04/30/2015 FINAL  Final   Organism ID, Bacteria METHICILLIN RESISTANT STAPHYLOCOCCUS AUREUS  Final      Susceptibility   Methicillin resistant staphylococcus aureus - MIC*    CIPROFLOXACIN >=8 RESISTANT Resistant     ERYTHROMYCIN >=8 RESISTANT Resistant     GENTAMICIN <=0.5 SENSITIVE Sensitive     OXACILLIN >=4 RESISTANT Resistant     TETRACYCLINE <=1 SENSITIVE Sensitive     VANCOMYCIN <=0.5 SENSITIVE Sensitive     TRIMETH/SULFA >=320 RESISTANT Resistant     CLINDAMYCIN >=8 RESISTANT Resistant     RIFAMPIN <=0.5 SENSITIVE Sensitive     Inducible Clindamycin NEGATIVE Sensitive     * METHICILLIN RESISTANT STAPHYLOCOCCUS AUREUS  Blood culture (routine x 2)     Status: None   Collection Time: 04/25/15  5:16 PM  Result Value Ref Range Status   Specimen Description BLOOD BLOOD RIGHT FOREARM  Final   Special Requests BOTTLES DRAWN AEROBIC AND ANAEROBIC 5 CC EA  Final   Culture  Setup Time   Final    GRAM POSITIVE COCCI IN CLUSTERS CRITICAL RESULT CALLED TO, READ BACK BY AND VERIFIED WITH: S GRAHAM 04/26/15 @ 48 M VESTAL IN BOTH AEROBIC AND ANAEROBIC BOTTLES    Culture   Final    METHICILLIN RESISTANT STAPHYLOCOCCUS AUREUS SUSCEPTIBILITIES PERFORMED ON PREVIOUS CULTURE WITHIN THE LAST 5  DAYS. MICROAEROPHILIC STREPTOCOCCI Standardized susceptibility testing for this organism is not available. Performed at [facility]    Report Status 05/03/2015 FINAL  Final  MRSA PCR Screening     Status: Abnormal   Collection Time: 04/25/15  6:55 PM  Result Value Ref Range Status   MRSA by PCR POSITIVE (A) NEGATIVE Final    Comment:  The GeneXpert MRSA Assay (FDA approved for NASAL specimens only), is one component of a comprehensive MRSA colonization surveillance program. It is not intended to diagnose MRSA infection nor to guide or monitor treatment for MRSA infections. RESULT CALLED TO, READ BACK BY AND VERIFIED WITH: Sharyn Lull REEVES,RN [id number] @ 2043 BY J SCOTTON   Culture, blood (routine x 2)     Status: None   Collection Time: 04/28/15  1:20 PM  Result Value Ref Range Status   Specimen Description BLOOD LEFT ARM  Final   Special Requests IN PEDIATRIC BOTTLE 1CC  Final   Culture  Setup Time   Final    GRAM POSITIVE COCCI IN CLUSTERS AEROBIC BOTTLE ONLY CRITICAL RESULT CALLED TO, READ BACK BY AND VERIFIED WITH: Hetty Blend RN 2222 04/30/15 A BROWNING    Culture   Final    MICROAEROPHILIC STREPTOCOCCI Standardized susceptibility testing for this organism is not available. Performed at [facility]    Report Status 05/03/2015 FINAL  Final  Culture, blood (routine x 2)     Status: None   Collection Time: 04/28/15  5:50 PM  Result Value Ref Range Status   Specimen Description BLOOD PICC LINE  Final   Special Requests BOTTLES DRAWN AEROBIC AND ANAEROBIC [facility] EACH  Final   Culture   Final    NO GROWTH 5 DAYS Performed at [facility]    Report Status 05/03/2015 FINAL  Final    Studies/Results: Ct Pelvis W Contrast  05/02/2015   CLINICAL DATA:  Pelvic abscess.  EXAM: CT PELVIS WITH CONTRAST  TECHNIQUE: Multidetector CT imaging of the pelvis was performed using the standard protocol following the bolus administration of intravenous contrast.   CONTRAST:  146mL OMNIPAQUE IOHEXOL 300 MG/ML  SOLN  COMPARISON:  MRI scan of April 28, 2015; CT scan of December 11, 2014.  FINDINGS: The patient appears to be status post surgical resection of the distal sacrum and coccyx. Overlying sacral decubitus ulcer is noted. Right buttocks ulceration is seen extending to the ischial tuberosity, where some lucency is noted suggesting osteomyelitis. Moderate ascites is noted. Ostomy is noted in left lower quadrant. Suprapubic catheter is noted within the urinary bladder. There is no evidence of bowel obstruction. The appendix appears normal. Moderate soft tissue swelling or anasarca is noted in the visualized soft tissues. Two fluid collections are noted along the right side of the pelvic wall just medial to right acetabulum ; 1 measures 27 x 13 mm superiorly. The other measures 36 x 12 mm more inferiorly. These appear to communicate posteriorly.  IMPRESSION: Moderate ascites.  Moderate anasarca is noted.  Status post surgical resection of distal sacrum and coccyx with overlying sacral decubitus ulcer.  Large deep ulceration is seen involving right buttocks area which extends to the cortex of the right ischial tuberosity, where some lucency is noted suggesting osteomyelitis. Two abscesses are noted in this area along the medial portion of the right acetabulum which appear to communicate posteriorly.   Electronically Signed   By: Marijo Conception, M.D.   On: 05/02/2015 14:47     Assessment/Plan: MRSA bacteremia His repeat BCx are negative.  Await TEE  Drug monitoring His vanco level is pending His Cr is stable  Decubitus ulcers R internal obturator abscess appreciate wound care f/u as well as Dr Migdalia Dk.  He is to have his abscess drained via vac, appears that IR is not going to aspirate.   L heel wound- no abscess or osteo Will continue  his vanco/zosyn Debridement per surgery  UTI- E coli Will continue his vanco/zosyn  ? HCAP Will continue his  vanco/zosyn  Thrombocytosis, leukocytosis Due to "sepsis" and to steroids.  Further worsened today. Could indicate he needs his abscess drained primarily rather than via his VAC.   Quadraplegia  Protein calorie malnutrition, severe Continue protein supplementation  Hypotension, chronic Probably related to neuropathic changes due to spinal cord damage.  Continue midodrine.   Total days of antibiotics: 8  7/18 vanco/aztreonam  His mother is updated.          [name] Infectious Diseases (pager) [phone] [email or web address] 05/03/2015, 4:31 PM  LOS: 8 days

## 2015-05-03 NOTE — Progress Notes (Signed)
Inpatient Diabetes Program Recommendations  AACE/ADA: New Consensus Statement on Inpatient Glycemic Control (2013)  Target Ranges:  Prepandial:   less than 140 mg/dL      Peak postprandial:   less than 180 mg/dL (1-2 hours)      Critically ill patients:  140 - 180 mg/dL   Results for [name], HAWKER (MRN [id number]) as of 05/03/2015 10:41  Ref. Range 05/02/2015 07:33 05/02/2015 11:30 05/02/2015 21:44  Glucose-Capillary Latest Ref Range: 65-99 mg/dL 278 (H) 238 (H) 215 (H)   Results for KEALII, THUESON (MRN [id number]) as of 05/03/2015 10:41  Ref. Range 05/03/2015 07:44  Glucose-Capillary Latest Ref Range: 65-99 mg/dL 263 (H)    Home DM Meds: Lantus 20 units QHS       Novolog 5 units tidwc  Current DM Orders: Lantus 5 units daily            Novolog Moderate SSI (0-15 units) TID AC    -Note IV Solucortef stopped yesterday.  Last dose given yesterday at 6AM.  -Glucose levels still elevated.  -Patient takes much larger dose of Lantus at home, however also note that patient had Hypoglycemia on AM of 07/24.    MD- Please consider a slight increase of Lantus to 8 units daily    Will follow Wyn Quaker RN, MSN, CDE Diabetes Coordinator Inpatient Glycemic Control Team Team Pager: [phone] (8a-5p)

## 2015-05-04 DIAGNOSIS — G4700 Insomnia, unspecified: Secondary | ICD-10-CM

## 2015-05-04 DIAGNOSIS — K651 Peritoneal abscess: Secondary | ICD-10-CM | POA: Insufficient documentation

## 2015-05-04 LAB — CBC WITH DIFFERENTIAL/PLATELET
BASOS PCT: 0 % (ref 0–1)
Basophils Absolute: 0 10*3/uL (ref 0.0–0.1)
Eosinophils Absolute: 0.3 10*3/uL (ref 0.0–0.7)
Eosinophils Relative: 1 % (ref 0–5)
HCT: 23.6 % — ABNORMAL LOW (ref 39.0–52.0)
Hemoglobin: 7.4 g/dL — ABNORMAL LOW (ref 13.0–17.0)
LYMPHS PCT: 9 % — AB (ref 12–46)
Lymphs Abs: 2.3 10*3/uL (ref 0.7–4.0)
MCH: 25 pg — AB (ref 26.0–34.0)
MCHC: 31.4 g/dL (ref 30.0–36.0)
MCV: 79.7 fL (ref 78.0–100.0)
Monocytes Absolute: 1.8 10*3/uL — ABNORMAL HIGH (ref 0.1–1.0)
Monocytes Relative: 7 % (ref 3–12)
NEUTROS PCT: 83 % — AB (ref 43–77)
Neutro Abs: 21.7 10*3/uL — ABNORMAL HIGH (ref 1.7–7.7)
Platelets: 594 10*3/uL — ABNORMAL HIGH (ref 150–400)
RBC: 2.96 MIL/uL — AB (ref 4.22–5.81)
RDW: 17.4 % — ABNORMAL HIGH (ref 11.5–15.5)
WBC: 26.1 10*3/uL — ABNORMAL HIGH (ref 4.0–10.5)

## 2015-05-04 LAB — GLUCOSE, CAPILLARY
Glucose-Capillary: 112 mg/dL — ABNORMAL HIGH (ref 65–99)
Glucose-Capillary: 166 mg/dL — ABNORMAL HIGH (ref 65–99)
Glucose-Capillary: 152 mg/dL — ABNORMAL HIGH (ref 65–99)

## 2015-05-04 LAB — BASIC METABOLIC PANEL
Anion gap: 2 — ABNORMAL LOW (ref 5–15)
BUN: 17 mg/dL (ref 6–20)
CO2: 17 mmol/L — AB (ref 22–32)
Calcium: 7.4 mg/dL — ABNORMAL LOW (ref 8.9–10.3)
Chloride: 111 mmol/L (ref 101–111)
Creatinine, Ser: 0.59 mg/dL — ABNORMAL LOW (ref 0.61–1.24)
GFR calc Af Amer: >60 mL/min (ref >60)
GFR calc non Af Amer: >60 mL/min (ref >60)
GLUCOSE: 175 mg/dL — AB (ref 65–99)
Potassium: 3.2 mmol/L — ABNORMAL LOW (ref 3.5–5.1)
SODIUM: 130 mmol/L — AB (ref 135–145)

## 2015-05-04 LAB — VANCOMYCIN, TROUGH: VANCOMYCIN TR: 27 ug/mL — AB (ref 10.0–20.0)

## 2015-05-04 LAB — MAGNESIUM: MAGNESIUM: 2 mg/dL (ref 1.7–2.4)

## 2015-05-04 MED ORDER — SODIUM BICARBONATE 8.4 % IV SOLN
INTRAVENOUS | Status: DC
  Administered 2015-05-04: 17:00:00 via INTRAVENOUS
  Filled 2015-05-04: qty 150

## 2015-05-04 MED ORDER — OXYCODONE HCL 5 MG PO TABS
10.0000 mg | ORAL_TABLET | ORAL | Status: DC | PRN
  Administered 2015-05-04 – 2015-06-01 (×22): 10 mg via ORAL
  Filled 2015-05-04 (×24): qty 2

## 2015-05-04 MED ORDER — POTASSIUM CHLORIDE CRYS ER 20 MEQ PO TBCR
40.0000 meq | EXTENDED_RELEASE_TABLET | Freq: Once | ORAL | Status: AC
  Administered 2015-05-04: 40 meq via ORAL
  Filled 2015-05-04: qty 2

## 2015-05-04 MED ORDER — PANTOPRAZOLE SODIUM 40 MG PO TBEC
40.0000 mg | DELAYED_RELEASE_TABLET | Freq: Every day | ORAL | Status: DC
  Administered 2015-05-04 – 2015-05-07 (×3): 40 mg via ORAL
  Filled 2015-05-04 (×3): qty 1

## 2015-05-04 MED ORDER — KETOROLAC TROMETHAMINE 30 MG/ML IJ SOLN
15.0000 mg | Freq: Three times a day (TID) | INTRAMUSCULAR | Status: DC | PRN
  Administered 2015-05-04 – 2015-05-05 (×3): 15 mg via INTRAVENOUS
  Filled 2015-05-04 (×3): qty 1

## 2015-05-04 NOTE — Progress Notes (Signed)
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 He was admitted for further evaluation of sepsis. His blood cultures revealed MRSA and has been on vancomycin. ID on board. MRI of the pelvis revealed a small RIGHT internal obturator abscess and initial plan was to undergo CT guided aspiration by IR. Dr Earleen Newport spoke to Dr Migdalia Dk, who have decided to watch him on IV Antibiotics for now. CCM signed off 7/27. Off pressors since 7/25. Cardiology consulted for possible TEE 7/28. Apparently scheduled for surgery by plastic surgery on 7/28 at 5 PM.   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 - Continue midodrine - Off levo fed since 7/25  MRSA bacteremia - Most likely related to decubitus ulcer and pelvic osteomyelitis - Blood cultures 2 from 7/18: MRSA. Surveillance blood culture 1 on  7/21: Negative. Surveillance blood culture 2 on 7/09: Microaerophilic streptococci. - Currently on IV vancomycin and aztreonam per pharmacy - Infectious disease following. - IR drainage on hold pending improvement with wound VAC. As per nurse's report, supposed to have surgery by Dr. Amelia Jo on 7/28 at 5 PM. - Consulted cardiology for possible TEE on 7/28 (could not be done on 7/22 since he was still on vasopressors)  Sacral decubitus ulcer/right internal obturator abscess/left heel wound- s/p I&D - Wound care team, general surgery, orthopedics and plastic surgery following. - MRI results as below. - Status post bedside debridement by CCS. - Orthopedics was consulted and recommended IR for aspiration of abscess. IR discussed with plastic  surgery and indicated that his abscess is communicating with external wounds and should improve with wound VAC and antibiotics. Aspiration was held. - As per nurse's report, supposed to have surgery on 7/28.  Uncontrolled type I DM with neuropathy - Continue Lantus and SSI. CBGs reasonable  Possible adrenal insufficiency - Cortisol low but no response in blood pressure to stress dose steroids. Steroids discontinued.  Acute encephalopathy - As per discussion with patient's mother at bedside on 7/27, patient intermittently confused, drowsy and slurred speech which she suspects related to IV Dilaudid. - DC IV Dilaudid and minimize narcotics. Use IV Toradol briefly for severe pain.  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Hypokalemia/hypomagnesemia - Replace as needed and follow periodically.  Non-anion gap metabolic acidosis - Patient on bicarbonate drip. We will follow BMP today.  Escherichia coli UTI - Continue aztreonam  Severe leukocytosis - May be related to recent steroids. Improving  Reactive thrombocytosis - Follow CBCs  Severe protein calorie malnutrition - Continue nutritional supplements.  Anemia of chronic disease - Follow CBC in a.m. and transfuse if hemoglobin less than 7 g per DL.  Adult failure to thrive - Secondary to complex chronic multiple medical problems  History of pulmonary embolism - continue Apixaban  ? HCAP - Per ID    DVT prophylaxis: On full dose of Apixaban Code Status: Full Family Communication: Discussed at length with patient's mother at bedside on 7/27. Disposition Plan: To be determined. Continue to manage in stepdown unit. Not medically stable for discharge.   Consultants:  CCM-signed off 7/27  Infectious disease  General surgery  Orthopedics  Plastic surgery  Interventional radiology  Cardiology-for TEE  Procedures:  L heel ulcer status post debridement  on 04/30/15  Bilateral ischial wounds and sacral wound debridement by general surgery  on 7/23  RUE PICC line  Suprapubic catheter  LLQ colostomy  Sacral wound VAC  Antibiotics:  IV aztreonam 7/18 >   IV vancomycin 7/18 >  Subjective: As per patient's mother, patient having intermittent confusion and overnight was yelling at nurse-she thinks that this is due to his pain medications i.e. Dilaudid. Patient states he feels okay and denies any specific complaints.  Objective: Filed Vitals:   05/04/15 0900 05/04/15 1000 05/04/15 1100 05/04/15 1200  BP: 93/65 84/53 105/54 96/70  Pulse:      Temp:    96.2 F (35.7 C)  TempSrc:    Axillary  Resp: 14 11 15 16   Height:      Weight:      SpO2: 100% 100% 100% 100%    Intake/Output Summary (Last 24 hours) at 05/04/15 1354 Last data filed at 05/04/15 1200  Gross per 24 hour  Intake 2857.5 ml  Output   2310 ml  Net  547.5 ml   Filed Weights   04/29/15 0545 04/30/15 0400 05/01/15 0427  Weight: 59.4 kg (130 lb 15.3 oz) 61.1 kg (134 lb 11.2 oz) 63.4 kg (139 lb 12.4 oz)     Exam:  General exam: Moderately built and frail/cachectic chronically ill-looking male lying comfortably propped up in bed. Respiratory system: Reduced breath sounds in the bases. Rest of lung fields clear to auscultation. No increased work of breathing. Cardiovascular system: S1 & S2 heard, RRR. No JVD, murmurs, gallops, clicks or pedal edema. Telemetry: Sinus rhythm. Gastrointestinal system: Abdomen is nondistended, soft and nontender. Normal bowel sounds heard. Left colostomy with small liquid stools. Suprapubic catheter intact. Sacral wound VAC +. Central nervous system: Alert and oriented to person and place. No focal neurological deficits. Extremities: Chronic quadriplegia   Data Reviewed: Basic Metabolic Panel:  Recent Labs Lab 04/29/15 0352 04/30/15 0440 05/01/15 0410 05/02/15 0536 05/03/15 0440 05/04/15 0530  NA 134* 135 138 134* 133*  --   K 3.8 3.1* 3.7 4.3 3.9  --   CL 116* 119* 122* 120* 116*  --   CO2 14* 13* 14* 13*  14*  --   GLUCOSE 394* 178* 102* 303* 287*  --   BUN 13 12 12 14 16   --   CREATININE 0.47* 0.46* 0.52* 0.56* 0.58*  --   CALCIUM 7.8* 7.5* 7.3* 7.2* 7.4*  --   MG  --  1.1*  --  1.1* 1.6* 2.0  PHOS  --   --   --  3.2  --   --    Liver Function Tests:  Recent Labs Lab 04/28/15 0640  AST 8*  ALT 7*  ALKPHOS 173*  BILITOT 0.4  PROT 5.3*  ALBUMIN 1.1*   No results for input(s): LIPASE, AMYLASE in the last 168 hours. No results for input(s): AMMONIA in the last 168 hours. CBC:  Recent Labs Lab 04/30/15 0440 05/01/15 0410 05/02/15 0536 05/03/15 0440 05/04/15 0530  WBC 33.6* 26.0* 31.3* 48.6* 26.1*  NEUTROABS 27.6* 19.8* 29.4* 42.3* 21.7*  HGB 8.6* 8.2* 8.5* 8.3* 7.4*  HCT 28.4* 25.9* 26.4* 26.3* 23.6*  MCV 80.0 81.2 80.7 80.9 79.7  PLT 648* 600* 535* 672* 594*   Cardiac Enzymes: No results for input(s): CKTOTAL, CKMB, CKMBINDEX, TROPONINI in the last 168 hours. BNP (last 3 results) No results for input(s): PROBNP in the last 8760 hours. CBG:  Recent Labs Lab 05/03/15 1132 05/03/15 1650 05/03/15 2205  05/04/15 0740 05/04/15 1214  GLUCAP 206* 152* 162* 112* 166*    Recent Results (from the past 240 hour(s))  Urine culture     Status: None   Collection Time: 04/25/15  4:35 PM  Result Value Ref Range Status   Specimen Description URINE, CLEAN CATCH  Final   Special Requests Normal  Final   Culture   Final    >=100,000 COLONIES/mL ESCHERICHIA COLI AZTREONAM SENSITIVE ZONE 32MM Performed at [facility] Performed at [facility]    Report Status 05/02/2015 FINAL  Final   Organism ID, Bacteria ESCHERICHIA COLI  Final      Susceptibility   Escherichia coli - MIC*    AMPICILLIN <=2 SENSITIVE Sensitive     CEFAZOLIN <=4 SENSITIVE Sensitive     CEFTRIAXONE <=1 SENSITIVE Sensitive     CIPROFLOXACIN >=4 RESISTANT Resistant     GENTAMICIN <=1 SENSITIVE Sensitive     IMIPENEM <=0.25 SENSITIVE Sensitive     NITROFURANTOIN <=16 SENSITIVE Sensitive      TRIMETH/SULFA <=20 SENSITIVE Sensitive     AMPICILLIN/SULBACTAM <=2 SENSITIVE Sensitive     PIP/TAZO <=4 SENSITIVE Sensitive     * >=100,000 COLONIES/mL ESCHERICHIA COLI  Blood culture (routine x 2)     Status: None   Collection Time: 04/25/15  5:00 PM  Result Value Ref Range Status   Specimen Description BLOOD RIGHT ANTECUBITAL  Final   Special Requests BOTTLES DRAWN AEROBIC AND ANAEROBIC 5 CC EA  Final   Culture  Setup Time   Final    GRAM POSITIVE COCCI IN CLUSTERS Gram Stain Report Called to,Read Back By and Verified With: Elveria Rising RN 17:30 04/26/15 ([email or web address]) IN BOTH AEROBIC AND ANAEROBIC BOTTLES    Culture   Final    METHICILLIN RESISTANT STAPHYLOCOCCUS AUREUS MICROAEROPHILIC STREPTOCOCCI Standardized susceptibility testing for this organism is not available. Performed at [facility]    Report Status 04/30/2015 FINAL  Final   Organism ID, Bacteria METHICILLIN RESISTANT STAPHYLOCOCCUS AUREUS  Final      Susceptibility   Methicillin resistant staphylococcus aureus - MIC*    CIPROFLOXACIN >=8 RESISTANT Resistant     ERYTHROMYCIN >=8 RESISTANT Resistant     GENTAMICIN <=0.5 SENSITIVE Sensitive     OXACILLIN >=4 RESISTANT Resistant     TETRACYCLINE <=1 SENSITIVE Sensitive     VANCOMYCIN <=0.5 SENSITIVE Sensitive     TRIMETH/SULFA >=320 RESISTANT Resistant     CLINDAMYCIN >=8 RESISTANT Resistant     RIFAMPIN <=0.5 SENSITIVE Sensitive     Inducible Clindamycin NEGATIVE Sensitive     * METHICILLIN RESISTANT STAPHYLOCOCCUS AUREUS  Blood culture (routine x 2)     Status: None   Collection Time: 04/25/15  5:16 PM  Result Value Ref Range Status   Specimen Description BLOOD BLOOD RIGHT FOREARM  Final   Special Requests BOTTLES DRAWN AEROBIC AND ANAEROBIC 5 CC EA  Final   Culture  Setup Time   Final    GRAM POSITIVE COCCI IN CLUSTERS CRITICAL RESULT CALLED TO, READ BACK BY AND VERIFIED WITH: S GRAHAM 04/26/15 @ 22 M VESTAL IN BOTH AEROBIC AND ANAEROBIC BOTTLES    Culture    Final    METHICILLIN RESISTANT STAPHYLOCOCCUS AUREUS SUSCEPTIBILITIES PERFORMED ON PREVIOUS CULTURE WITHIN THE LAST 5 DAYS. MICROAEROPHILIC STREPTOCOCCI Standardized susceptibility testing for this organism is not available. Performed at [facility]    Report Status 05/03/2015 FINAL  Final  MRSA PCR Screening     Status: Abnormal   Collection Time:  04/25/15  6:55 PM  Result Value Ref Range Status   MRSA by PCR POSITIVE (A) NEGATIVE Final    Comment:        The GeneXpert MRSA Assay (FDA approved for NASAL specimens only), is one component of a comprehensive MRSA colonization surveillance program. It is not intended to diagnose MRSA infection nor to guide or monitor treatment for MRSA infections. RESULT CALLED TO, READ BACK BY AND VERIFIED WITH: Sharyn Lull REEVES,RN [id number] @ 2043 BY J SCOTTON   Culture, blood (routine x 2)     Status: None   Collection Time: 04/28/15  1:20 PM  Result Value Ref Range Status   Specimen Description BLOOD LEFT ARM  Final   Special Requests IN PEDIATRIC BOTTLE 1CC  Final   Culture  Setup Time   Final    GRAM POSITIVE COCCI IN CLUSTERS AEROBIC BOTTLE ONLY CRITICAL RESULT CALLED TO, READ BACK BY AND VERIFIED WITH: Hetty Blend RN 2222 04/30/15 A BROWNING    Culture   Final    MICROAEROPHILIC STREPTOCOCCI Standardized susceptibility testing for this organism is not available. Performed at [facility]    Report Status 05/03/2015 FINAL  Final  Culture, blood (routine x 2)     Status: None   Collection Time: 04/28/15  5:50 PM  Result Value Ref Range Status   Specimen Description BLOOD PICC LINE  Final   Special Requests BOTTLES DRAWN AEROBIC AND ANAEROBIC [facility] EACH  Final   Culture   Final    NO GROWTH 5 DAYS Performed at [facility]    Report Status 05/03/2015 FINAL  Final         Studies: Ct Pelvis W Contrast  05/02/2015   CLINICAL DATA:  Pelvic abscess.  EXAM: CT PELVIS WITH CONTRAST  TECHNIQUE: Multidetector CT  imaging of the pelvis was performed using the standard protocol following the bolus administration of intravenous contrast.  CONTRAST:  171mL OMNIPAQUE IOHEXOL 300 MG/ML  SOLN  COMPARISON:  MRI scan of April 28, 2015; CT scan of December 11, 2014.  FINDINGS: The patient appears to be status post surgical resection of the distal sacrum and coccyx. Overlying sacral decubitus ulcer is noted. Right buttocks ulceration is seen extending to the ischial tuberosity, where some lucency is noted suggesting osteomyelitis. Moderate ascites is noted. Ostomy is noted in left lower quadrant. Suprapubic catheter is noted within the urinary bladder. There is no evidence of bowel obstruction. The appendix appears normal. Moderate soft tissue swelling or anasarca is noted in the visualized soft tissues. Two fluid collections are noted along the right side of the pelvic wall just medial to right acetabulum ; 1 measures 27 x 13 mm superiorly. The other measures 36 x 12 mm more inferiorly. These appear to communicate posteriorly.  IMPRESSION: Moderate ascites.  Moderate anasarca is noted.  Status post surgical resection of distal sacrum and coccyx with overlying sacral decubitus ulcer.  Large deep ulceration is seen involving right buttocks area which extends to the cortex of the right ischial tuberosity, where some lucency is noted suggesting osteomyelitis. Two abscesses are noted in this area along the medial portion of the right acetabulum which appear to communicate posteriorly.   Electronically Signed   By: Marijo Conception, M.D.   On: 05/02/2015 14:47        Scheduled Meds: . apixaban  5 mg Oral BID  . aztreonam  1 g Intravenous Q8H  . baclofen  10 mg Oral BID WC  . baclofen  20 mg Oral QHS  . feeding supplement (ENSURE ENLIVE)  237 mL Oral BID BM  . feeding supplement (GLUCERNA SHAKE)  237 mL Oral TID BM  . feeding supplement (PRO-STAT SUGAR FREE 64)  30 mL Oral TID BM  . fentaNYL  50 mcg Transdermal Q72H  . insulin  aspart  0-15 Units Subcutaneous TID WC  . insulin glargine  15 Units Subcutaneous Daily  . loratadine  10 mg Oral QHS  . midodrine  20 mg Oral TID WC  . mirtazapine  7.5 mg Oral QHS  . pregabalin  100 mg Oral TID AC  . pregabalin  200 mg Oral QHS  . sodium chloride  10-40 mL Intracatheter Q12H  . vancomycin  750 mg Intravenous Q24H   Continuous Infusions: . sodium chloride    . norepinephrine (LEVOPHED) Adult infusion Stopped (05/02/15 1520)  .  sodium bicarbonate  infusion 1000 mL 75 mL/hr at 05/04/15 9562    Active Problems:   Type 1 diabetes, uncontrolled, with neuropathy   Spinal cord infarction (history of)   Sepsis   Stage IV decubitus ulcer   Urinary tract infectious disease   HCAP (healthcare-associated pneumonia)   Hypotension    Time spent: 22 minutes    Lavona Norsworthy, MD, FACP, FHM. [facility] Pager [phone]  If 7PM-7AM, please contact night-coverage [email or web address] Password [email or web address] 05/04/2015, 1:54 PM    LOS: 9 days

## 2015-05-04 NOTE — Progress Notes (Addendum)
RN and MD became aware that patient is scheduled for surgery with Dr. Migdalia Dk at [facility] on 7/28 for sacral wound debridement. Patient also scheduled for TEE in the morning on 7/28. Dr. Algis Liming still wishes for TEE to be scheduled for tomorrow morning and patient brought back to [facility] for surgery in the afternoon. Trish, cardiology keyholder called and informed of scheduling need. Said she would call nurse back with scheduled TEE time. Surgery PA Shawn Rayburn aware of TEE scheduled in the morning.

## 2015-05-04 NOTE — Consult Note (Signed)
Reason for Consult:Stage IV decubitus ulcer with abscess of pelvis Referring Physician: Dr. Reinaldo Raddle Jason [name] is an 29 y.o. male.  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 He was admitted with hypotension and found to have MRSA bacteremia and was felt to be in septic shock. He was started on IV Vancomycin and Azactam as well as pressors. He has stabilized enough to be taken off pressors so that he can have a TEE done to rule out endocarditis. MRI scan of his pelvis showed:  IMPRESSION: 1. Right internal obturator abscess, appearing to extend from the right ischial tuberosity decubitus ulcer. 2. Persistent abnormal signal from the right inferior pubic ramus remnants extending into the posterior aspect of the right acetabulum. 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 IR was unable to drain the abscess and the plan is for drainage in the OR tomorrow with Dr. Migdalia Dk.   Past Medical History  Diagnosis Date  . GERD (gastroesophageal reflux disease)   . Asthma   . Hx MRSA infection     on face  . Gastroparesis   . Diabetic neuropathy   . Seizures   . Family history of anesthesia complication     Pt mother can't have epidural procedures  . Dysrhythmia   . Pneumonia   . Arthritis   . Fibromyalgia   . 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 Type I diabetes mellitus     sees Dr. Loanne Drilling   . Syncope 02/16/2015    Past Surgical History  Procedure Laterality Date  . Tonsillectomy    . Multiple extractions with alveoloplasty N/A 08/03/2014    Procedure: MULTIPLE EXTRACTIONS;  Surgeon: Gae Bon, DDS;  Location: [place];  Service: Oral Surgery;  Laterality: N/A;  . Tee without cardioversion N/A 08/17/2014    Procedure: TRANSESOPHAGEAL ECHOCARDIOGRAM (TEE);  Surgeon: Dorothy Spark, MD;  Location: [facility] ENDOSCOPY;  Service: Cardiovascular;  Laterality: N/A;  .  Debridment of decubitus ulcer N/A 10/04/2014    Procedure: DEBRIDMENT OF DECUBITUS ULCER;  Surgeon: Georganna Skeans, MD;  Location: [place];  Service: General;  Laterality: N/A;  . Laparoscopic diverted colostomy N/A 10/12/2014    Procedure: LAPAROSCOPIC DIVERTING COLOSTOMY;  Surgeon: Donnie Mesa, MD;  Location: [place];  Service: General;  Laterality: N/A;  . Insertion of suprapubic catheter N/A 10/12/2014    Procedure: INSERTION OF SUPRAPUBIC CATHETER;  Surgeon: Reece Packer, MD;  Location: [place];  Service: Urology;  Laterality: N/A;  . Incision and drainage of wound N/A 11/12/2014    Procedure: IRRIGATION AND DEBRIDEMENT OF WOUNDS WITH BONE BIOPSY AND SURGICAL PREP ;  Surgeon: Theodoro Kos, DO;  Location: [place];  Service: Plastics;  Laterality: N/A;  . Application of a-cell of back N/A 11/12/2014    Procedure: APPLICATION A CELL AND VAC ;  Surgeon: Theodoro Kos, DO;  Location: [place];  Service: Plastics;  Laterality: N/A;  . Incision and drainage of wound N/A 11/18/2014    Procedure: IRRIGATION AND DEBRIDEMENT OF SACRAL ULCER ONLY WITH PLACEMENT OF A CELL AND VAC/ DRESSING CHANGE TO UPPER BACK AREA.;  Surgeon: Theodoro Kos, DO;  Location: [place];  Service: Plastics;  Laterality: N/A;  . Incision and drainage of wound N/A 11/25/2014    Procedure: IRRIGATION AND DEBRIDEMENT OF SACRAL ULCER AND BACK BURN WITH PLACEMENT OF A-CELL;  Surgeon: Theodoro Kos, DO;  Location: [place];  Service: Clinical cytogeneticist;  Laterality: N/A;  . Minor application of wound vac N/A 11/25/2014    Procedure:  WOUND VAC CHANGE;  Surgeon: Theodoro Kos, DO;  Location: [place];  Service: Plastics;  Laterality: N/A;  . Incision and drainage of wound Right 12/08/2014    Procedure: IRRIGATION AND DEBRIDEMENT SACRAL WOUND AND RIGHT ISCHIAL WOUND ;  Surgeon: Theodoro Kos, DO;  Location: [place];  Service: Plastics;  Laterality: Right;  . Application of a-cell of back N/A 12/08/2014    Procedure: APPLICATION OF A-CELL AND WOUND VAC ;  Surgeon: Theodoro Kos, DO;  Location: [place];  Service: Plastics;  Laterality: N/A;    Family History  Problem Relation Age of Onset  . Diabetes Father   . Hypertension Father   . Asthma      fhx  . Hypertension      fhx  . Stroke      fhx  . Heart disease Mother     Social History:  reports that he quit smoking about 7 months ago. His smoking use included Cigars and Cigarettes. He has a 12 pack-year smoking history. He has never used smokeless tobacco. He reports that he does not drink alcohol or use illicit drugs.  Allergies:  Allergies  Allergen Reactions  . Cefuroxime Axetil Anaphylaxis  . Ertapenem Other (See Comments)    Rash and confusion  . Morphine And Related Other (See Comments)    Changed mental status, confusion, headache, visual hallucination  . Penicillins Anaphylaxis and Other (See Comments)    ?can take amoxicillin?  . Sulfa Antibiotics Anaphylaxis, Shortness Of Breath and Other (See Comments)  . Tessalon [Benzonatate] Anaphylaxis  . Shellfish Allergy Itching and Other (See Comments)    Took benadryl to alleviate reaction  . Miripirium Rash and Other (See Comments)    Change in mental status    Medications: I have reviewed the patient's current medications.  Results for orders placed or performed during the hospital encounter of 04/25/15 (from the past 48 hour(s))  Glucose, capillary     Status: Abnormal   Collection Time: 05/02/15  5:05 PM  Result Value Ref Range   Glucose-Capillary 215 (H) 65 - 99 mg/dL   Comment 1 Notify RN    Comment 2 Document in Chart   Glucose, capillary     Status: Abnormal   Collection Time: 05/02/15  9:44 PM  Result Value Ref Range   Glucose-Capillary 215 (H) 65 - 99 mg/dL   Comment 1 Notify RN   CBC with Differential/Platelet     Status: Abnormal   Collection Time: 05/03/15  4:40 AM  Result Value Ref Range   WBC 48.6 (H) 4.0 - 10.5 K/uL   RBC 3.25 (L) 4.22 - 5.81 MIL/uL   Hemoglobin 8.3 (L) 13.0 - 17.0 g/dL   HCT 26.3 (L) 39.0 -  52.0 %   MCV 80.9 78.0 - 100.0 fL   MCH 25.5 (L) 26.0 - 34.0 pg   MCHC 31.6 30.0 - 36.0 g/dL   RDW 17.4 (H) 11.5 - 15.5 %   Platelets 672 (H) 150 - 400 K/uL   Neutrophils Relative % 87 (H) 43 - 77 %   Lymphocytes Relative 7 (L) 12 - 46 %   Monocytes Relative 6 3 - 12 %   Eosinophils Relative 0 0 - 5 %   Basophils Relative 0 0 - 1 %   Neutro Abs 42.3 (H) 1.7 - 7.7 K/uL   Lymphs Abs 3.4 0.7 - 4.0 K/uL  Monocytes Absolute 2.9 (H) 0.1 - 1.0 K/uL   Eosinophils Absolute 0.0 0.0 - 0.7 K/uL   Basophils Absolute 0.0 0.0 - 0.1 K/uL   WBC Morphology MILD LEFT SHIFT (1-5% METAS, OCC MYELO, OCC BANDS)   Basic metabolic panel     Status: Abnormal   Collection Time: 05/03/15  4:40 AM  Result Value Ref Range   Sodium 133 (L) 135 - 145 mmol/L   Potassium 3.9 3.5 - 5.1 mmol/L   Chloride 116 (H) 101 - 111 mmol/L   CO2 14 (L) 22 - 32 mmol/L   Glucose, Bld 287 (H) 65 - 99 mg/dL   BUN 16 6 - 20 mg/dL   Creatinine, Ser 0.58 (L) 0.61 - 1.24 mg/dL   Calcium 7.4 (L) 8.9 - 10.3 mg/dL   GFR calc non Af Amer >60 >60 mL/min   GFR calc Af Amer >60 >60 mL/min    Comment: (NOTE) The eGFR has been calculated using the CKD EPI equation. This calculation has not been validated in all clinical situations. eGFR's persistently <60 mL/min signify possible Chronic Kidney Disease.    Anion gap 3 (L) 5 - 15  Magnesium     Status: Abnormal   Collection Time: 05/03/15  4:40 AM  Result Value Ref Range   Magnesium 1.6 (L) 1.7 - 2.4 mg/dL  Glucose, capillary     Status: Abnormal   Collection Time: 05/03/15  7:44 AM  Result Value Ref Range   Glucose-Capillary 263 (H) 65 - 99 mg/dL  Glucose, capillary     Status: Abnormal   Collection Time: 05/03/15 11:32 AM  Result Value Ref Range   Glucose-Capillary 206 (H) 65 - 99 mg/dL  Glucose, capillary     Status: Abnormal   Collection Time: 05/03/15  4:50 PM  Result Value Ref Range   Glucose-Capillary 152 (H) 65 - 99 mg/dL   Comment 1 Notify RN    Comment 2 Document in  Chart   Glucose, capillary     Status: Abnormal   Collection Time: 05/03/15 10:05 PM  Result Value Ref Range   Glucose-Capillary 162 (H) 65 - 99 mg/dL   Comment 1 Notify RN    Comment 2 Document in Chart   CBC with Differential/Platelet     Status: Abnormal   Collection Time: 05/04/15  5:30 AM  Result Value Ref Range   WBC 26.1 (H) 4.0 - 10.5 K/uL   RBC 2.96 (L) 4.22 - 5.81 MIL/uL   Hemoglobin 7.4 (L) 13.0 - 17.0 g/dL   HCT 23.6 (L) 39.0 - 52.0 %   MCV 79.7 78.0 - 100.0 fL   MCH 25.0 (L) 26.0 - 34.0 pg   MCHC 31.4 30.0 - 36.0 g/dL   RDW 17.4 (H) 11.5 - 15.5 %   Platelets 594 (H) 150 - 400 K/uL   Neutrophils Relative % 83 (H) 43 - 77 %   Lymphocytes Relative 9 (L) 12 - 46 %   Monocytes Relative 7 3 - 12 %   Eosinophils Relative 1 0 - 5 %   Basophils Relative 0 0 - 1 %   Neutro Abs 21.7 (H) 1.7 - 7.7 K/uL   Lymphs Abs 2.3 0.7 - 4.0 K/uL   Monocytes Absolute 1.8 (H) 0.1 - 1.0 K/uL   Eosinophils Absolute 0.3 0.0 - 0.7 K/uL   Basophils Absolute 0.0 0.0 - 0.1 K/uL   Smear Review MORPHOLOGY UNREMARKABLE   Magnesium     Status: None   Collection Time: 05/04/15  5:30  AM  Result Value Ref Range   Magnesium 2.0 1.7 - 2.4 mg/dL  Glucose, capillary     Status: Abnormal   Collection Time: 05/04/15  7:40 AM  Result Value Ref Range   Glucose-Capillary 112 (H) 65 - 99 mg/dL  Glucose, capillary     Status: Abnormal   Collection Time: 05/04/15 12:14 PM  Result Value Ref Range   Glucose-Capillary 166 (H) 65 - 99 mg/dL    No results found.  Review of Systems  Unable to perform ROS: medical condition   Blood pressure 90/75, pulse 75, temperature 96.2 F (35.7 C), temperature source Axillary, resp. rate 19, height 5' 8" (1.727 m), weight 63.4 kg (139 lb 12.4 oz), SpO2 100 %. Physical Exam  Constitutional:  Very thin chronically ill appearing male lying in bed currently sleeping.   Cardiovascular: Normal rate.   Respiratory:  Shallow breaths and elevated RR.  Patient quadriplegic   GI:  Colostomy/SP tube in place  Musculoskeletal:  quadriplegia  Neurological:  Sleeping  Skin:  VAC dressing in place over the sacral and ischial areas    Assessment/Plan: Chronic Stage IV decubitus ulcer of the sacrum/right ischium with known osteomyelitis of the pelvis-  Plan irrigation and debridement and drainage of abscess in the OR tomorrow afternoon.   Cardiology has planned TEE for tomorrow am at [facility], then return to [facility] for OR in the late afternoon.  Spoke with mother at bedside and she is in agreement with plan.  Ulysees Robarts,PA-C Plastic Surgery [phone]

## 2015-05-04 NOTE — Progress Notes (Signed)
Pt becoming very anxious about not being able to sleep. Mother explained that Dilaudid makes pt act "like this" and requesting a change in pain medication. MD notified of both. New pain medication orders received. Lights dimmed, blinds closed, TV turned down to help promote sleep, and new pain medication given. Will continue to monitor patient.

## 2015-05-04 NOTE — Progress Notes (Signed)
INFECTIOUS DISEASE PROGRESS NOTE  ID: Jason [name] is a 29 y.o. male with  Active Problems:   Type 1 diabetes, uncontrolled, with neuropathy   Spinal cord infarction (history of)   Sepsis   Stage IV decubitus ulcer   Urinary tract infectious disease   HCAP (healthcare-associated pneumonia)   Hypotension   Abdominopelvic abscess  Subjective: Pt slept poorly- mom attributes to pain rx  Abtx:  Anti-infectives    Start     Dose/Rate Route Frequency Ordered Stop   04/28/15 2000  vancomycin (VANCOCIN) IVPB 750 mg/150 ml premix     750 mg 150 mL/hr over 60 Minutes Intravenous Every 24 hours 04/28/15 0731     04/26/15 0800  vancomycin (VANCOCIN) IVPB 750 mg/150 ml premix  Status:  Discontinued     750 mg 150 mL/hr over 60 Minutes Intravenous Every 12 hours 04/25/15 1715 04/28/15 0731   04/26/15 0200  aztreonam (AZACTAM) 1 g in dextrose 5 % 50 mL IVPB     1 g 100 mL/hr over 30 Minutes Intravenous Every 8 hours 04/25/15 1919     04/25/15 1715  vancomycin (VANCOCIN) IVPB 750 mg/150 ml premix     750 mg 150 mL/hr over 60 Minutes Intravenous STAT 04/25/15 1715 04/25/15 1906   04/25/15 1645  aztreonam (AZACTAM) 2 g in dextrose 5 % 50 mL IVPB     2 g 100 mL/hr over 30 Minutes Intravenous  Once 04/25/15 1643 04/25/15 1807      Medications:  Scheduled: . apixaban  5 mg Oral BID  . aztreonam  1 g Intravenous Q8H  . baclofen  10 mg Oral BID WC  . baclofen  20 mg Oral QHS  . feeding supplement (ENSURE ENLIVE)  237 mL Oral BID BM  . feeding supplement (GLUCERNA SHAKE)  237 mL Oral TID BM  . feeding supplement (PRO-STAT SUGAR FREE 64)  30 mL Oral TID BM  . fentaNYL  50 mcg Transdermal Q72H  . insulin aspart  0-15 Units Subcutaneous TID WC  . insulin glargine  15 Units Subcutaneous Daily  . loratadine  10 mg Oral QHS  . midodrine  20 mg Oral TID WC  . mirtazapine  7.5 mg Oral QHS  . pantoprazole  40 mg Oral Daily  . pregabalin  100 mg Oral TID AC  . pregabalin  200 mg Oral QHS   . sodium chloride  10-40 mL Intracatheter Q12H  . vancomycin  750 mg Intravenous Q24H    Objective: Vital signs in last 24 hours: Temp:  [96.2 F (35.7 C)-98 F (36.7 C)] 96.2 F (35.7 C) (07/27 1200) Resp:  [9-22] 19 (07/27 1400) BP: (84-143)/(50-92) 90/75 mmHg (07/27 1400) SpO2:  [95 %-100 %] 100 % (07/27 1400) Arterial Line BP: (84-121)/(37-66) 121/66 mmHg (07/26 2000)   General appearance: alert, cooperative and no distress Resp: clear to auscultation bilaterally Cardio: regular rate and rhythm GI: normal findings: bowel sounds normal and soft, non-tender Extremities: LLE wrapped.   Lab Results  Recent Labs  05/02/15 0536 05/03/15 0440 05/04/15 0530  WBC 31.3* 48.6* 26.1*  HGB 8.5* 8.3* 7.4*  HCT 26.4* 26.3* 23.6*  NA 134* 133*  --   K 4.3 3.9  --   CL 120* 116*  --   CO2 13* 14*  --   BUN 14 16  --   CREATININE 0.56* 0.58*  --    Liver Panel No results for input(s): PROT, ALBUMIN, AST, ALT, ALKPHOS, BILITOT, BILIDIR, IBILI in the last  72 hours. Sedimentation Rate No results for input(s): ESRSEDRATE in the last 72 hours. C-Reactive Protein No results for input(s): CRP in the last 72 hours.  Microbiology: Recent Results (from the past 240 hour(s))  Urine culture     Status: None   Collection Time: 04/25/15  4:35 PM  Result Value Ref Range Status   Specimen Description URINE, CLEAN CATCH  Final   Special Requests Normal  Final   Culture   Final    >=100,000 COLONIES/mL ESCHERICHIA COLI AZTREONAM SENSITIVE ZONE 32MM Performed at [facility] Performed at [facility]    Report Status 05/02/2015 FINAL  Final   Organism ID, Bacteria ESCHERICHIA COLI  Final      Susceptibility   Escherichia coli - MIC*    AMPICILLIN <=2 SENSITIVE Sensitive     CEFAZOLIN <=4 SENSITIVE Sensitive     CEFTRIAXONE <=1 SENSITIVE Sensitive     CIPROFLOXACIN >=4 RESISTANT Resistant     GENTAMICIN <=1 SENSITIVE Sensitive     IMIPENEM <=0.25 SENSITIVE Sensitive      NITROFURANTOIN <=16 SENSITIVE Sensitive     TRIMETH/SULFA <=20 SENSITIVE Sensitive     AMPICILLIN/SULBACTAM <=2 SENSITIVE Sensitive     PIP/TAZO <=4 SENSITIVE Sensitive     * >=100,000 COLONIES/mL ESCHERICHIA COLI  Blood culture (routine x 2)     Status: None   Collection Time: 04/25/15  5:00 PM  Result Value Ref Range Status   Specimen Description BLOOD RIGHT ANTECUBITAL  Final   Special Requests BOTTLES DRAWN AEROBIC AND ANAEROBIC 5 CC EA  Final   Culture  Setup Time   Final    GRAM POSITIVE COCCI IN CLUSTERS Gram Stain Report Called to,Read Back By and Verified With: Elveria Rising RN 17:30 04/26/15 ([email or web address]) IN BOTH AEROBIC AND ANAEROBIC BOTTLES    Culture   Final    METHICILLIN RESISTANT STAPHYLOCOCCUS AUREUS MICROAEROPHILIC STREPTOCOCCI Standardized susceptibility testing for this organism is not available. Performed at [facility]    Report Status 04/30/2015 FINAL  Final   Organism ID, Bacteria METHICILLIN RESISTANT STAPHYLOCOCCUS AUREUS  Final      Susceptibility   Methicillin resistant staphylococcus aureus - MIC*    CIPROFLOXACIN >=8 RESISTANT Resistant     ERYTHROMYCIN >=8 RESISTANT Resistant     GENTAMICIN <=0.5 SENSITIVE Sensitive     OXACILLIN >=4 RESISTANT Resistant     TETRACYCLINE <=1 SENSITIVE Sensitive     VANCOMYCIN <=0.5 SENSITIVE Sensitive     TRIMETH/SULFA >=320 RESISTANT Resistant     CLINDAMYCIN >=8 RESISTANT Resistant     RIFAMPIN <=0.5 SENSITIVE Sensitive     Inducible Clindamycin NEGATIVE Sensitive     * METHICILLIN RESISTANT STAPHYLOCOCCUS AUREUS  Blood culture (routine x 2)     Status: None   Collection Time: 04/25/15  5:16 PM  Result Value Ref Range Status   Specimen Description BLOOD BLOOD RIGHT FOREARM  Final   Special Requests BOTTLES DRAWN AEROBIC AND ANAEROBIC 5 CC EA  Final   Culture  Setup Time   Final    GRAM POSITIVE COCCI IN CLUSTERS CRITICAL RESULT CALLED TO, READ BACK BY AND VERIFIED WITH: S GRAHAM 04/26/15 @ 75 M VESTAL IN  BOTH AEROBIC AND ANAEROBIC BOTTLES    Culture   Final    METHICILLIN RESISTANT STAPHYLOCOCCUS AUREUS SUSCEPTIBILITIES PERFORMED ON PREVIOUS CULTURE WITHIN THE LAST 5 DAYS. MICROAEROPHILIC STREPTOCOCCI Standardized susceptibility testing for this organism is not available. Performed at [facility]    Report Status 05/03/2015 FINAL  Final  MRSA PCR Screening     Status: Abnormal   Collection Time: 04/25/15  6:55 PM  Result Value Ref Range Status   MRSA by PCR POSITIVE (A) NEGATIVE Final    Comment:        The GeneXpert MRSA Assay (FDA approved for NASAL specimens only), is one component of a comprehensive MRSA colonization surveillance program. It is not intended to diagnose MRSA infection nor to guide or monitor treatment for MRSA infections. RESULT CALLED TO, READ BACK BY AND VERIFIED WITH: Sharyn Lull REEVES,RN [id number] @ 2043 BY J SCOTTON   Culture, blood (routine x 2)     Status: None   Collection Time: 04/28/15  1:20 PM  Result Value Ref Range Status   Specimen Description BLOOD LEFT ARM  Final   Special Requests IN PEDIATRIC BOTTLE 1CC  Final   Culture  Setup Time   Final    GRAM POSITIVE COCCI IN CLUSTERS AEROBIC BOTTLE ONLY CRITICAL RESULT CALLED TO, READ BACK BY AND VERIFIED WITH: Hetty Blend RN [id number] 04/30/15 A BROWNING    Culture   Final    MICROAEROPHILIC STREPTOCOCCI Standardized susceptibility testing for this organism is not available. Performed at [facility]    Report Status 05/03/2015 FINAL  Final  Culture, blood (routine x 2)     Status: None   Collection Time: 04/28/15  5:50 PM  Result Value Ref Range Status   Specimen Description BLOOD PICC LINE  Final   Special Requests BOTTLES DRAWN AEROBIC AND ANAEROBIC 5CC EACH  Final   Culture   Final    NO GROWTH 5 DAYS Performed at [facility]    Report Status 05/03/2015 FINAL  Final    Studies/Results: No results found.   Assessment/Plan: MRSA bacteremia His repeat BCx 7-21 is  micro-aerophilic strep Will repeat for clearance  Await TEE in AM  Drug monitoring His vanco level is pending His Cr is stable  Decubitus ulcers R internal obturator abscess appreciate wound care f/u as well as Dr Migdalia Dk.  He is to have debridement in AM Will plan to continue zosyn once UTI course is complete to also treat his abscess  L heel wound- no abscess or osteo Will continue his vanco/zosyn Debridement per surgery  UTI- E coli Will continue his vanco/zosyn  ? HCAP Will continue his vanco/zosyn Will plan to continue zosyn once UTI course is complete to also treat his abscess  Thrombocytosis, leukocytosis Due to "sepsis" and to steroids.  Improved today today. Continue to follow  Quadraplegia  Insomnia His pain regiemen has been changed. Hopefully he will have better slepp.   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 Continue midodrine.   Total days of antibiotics: 9  7/18 vanco/aztreonam         [name] Infectious Diseases (pager) [phone] [email or web address] 05/04/2015, 2:45 PM  LOS: 9 days

## 2015-05-04 NOTE — Progress Notes (Signed)
ANTIBIOTIC CONSULT NOTE - Follow Up  Pharmacy Consult for Vancomycin, Aztreonam Indication: MRSA bacteremia, HCAP, UTI  Please see previous note from Hershal Coria, PharmD for full details.  7/18 >> Vancomycin >>  7/18 >> Aztreonam >>   7/18 blood: 2 of 2 MRSA, microaerophilic strep  2/50 urine: >100k E.coli (pansensitive except Cipro; sensitive to aztreonam)  7/18 MRSA PCR: positive  7/21 blood: 1/2 microaerophilic strep  Today, 03/70/4888: Day #10 Vancomycin and Aztreonam  Dose changes/levels:  7/21: VT at 07:00 = 25 before 5th dose of 750mg  q12h - decrease to q24h.  7/24: VT = 16 on 750mg  q24h  7/27: VT = 27 on 750mg  q24h   Goal of Therapy:  Vancomycin trough level 15-20 mcg/ml  Appropriate antibiotic dosing for renal function; eradication of infection  Plan:   Hold vancomycin, check level 48hr after last dose (7/28 at 20:00)  Peggyann Juba, PharmD, BCPS Pager: [phone] 05/04/2015 7:42 PM

## 2015-05-04 NOTE — Consult Note (Signed)
WOC wound follow up Wound type:Bilateral ischium and sacrum, left foot and mid back.  Measurement:Not assesed  No dressing change today. Patient to OR tomorrow and mother wishes to wait until then.  Patient is crying and distraught that he cannot sleep today. Moaning and wimpering.  MD and bedside nurse aware. Provided emotional support.  Wound bed:Not assessed Drainage (amount, consistency, odor) 100cc dark red exudate in the canister Periwound:Not assessed Patient is on the surgery scheduler for tomorrow with Dr Migdalia Dk at [facility].  Mother was unaware of this and states his rescheduled TEE is also tomorrow at [facility].  Reassured mother that we would insure that the scheduling of procedures went smoothly.  Shared this information with bedside RN Meagan, who was going to ensure that the scheduling for these procedures was correct.   Will not change intact NPWT (VAC) dressing today since debridement is tomorrow, patient is despondent and tearful and mother wishes to wait.  [place] team will continue to follow and remain available to patient medical and nursing teams.  Domenic Moras RN BSN [place] Pager [phone]

## 2015-05-04 NOTE — Progress Notes (Signed)
ANTIBIOTIC CONSULT NOTE - Follow Up  Pharmacy Consult for Vancomycin, Aztreonam Indication: MRSA bacteremia, HCAP, UTI  Allergies  Allergen Reactions  . Cefuroxime Axetil Anaphylaxis  . Ertapenem Other (See Comments)    Rash and confusion  . Morphine And Related Other (See Comments)    Changed mental status, confusion, headache, visual hallucination  . Penicillins Anaphylaxis and Other (See Comments)    ?can take amoxicillin?  . Sulfa Antibiotics Anaphylaxis, Shortness Of Breath and Other (See Comments)  . Tessalon [Benzonatate] Anaphylaxis  . Shellfish Allergy Itching and Other (See Comments)    Took benadryl to alleviate reaction  . Miripirium Rash and Other (See Comments)    Change in mental status    Patient Measurements: Height: 5\' 8"  (172.7 cm) Weight: 139 lb 12.4 oz (63.4 kg) IBW/kg (Calculated) : 68.4  Vital Signs: Temp: 96.3 F (35.7 C) (07/27 0800) Temp Source: Axillary (07/27 0800) BP: 105/54 mmHg (07/27 1100) Intake/Output from previous day: 07/26 0701 - 07/27 0700 In: 3007.5 [P.O.:600; I.V.:2107.5; IV Piggyback:300] Out: 1955 [Urine:875; Drains:200; Stool:880] Intake/Output from this shift: Total I/O In: 430 [I.V.:380; IV Piggyback:50] Out: 430 [Urine:330; Stool:100]  Labs:  Recent Labs  05/02/15 0536 05/03/15 0440 05/04/15 0530  WBC 31.3* 48.6* 26.1*  HGB 8.5* 8.3* 7.4*  PLT 535* 672* 594*  CREATININE 0.56* 0.58*  --    Estimated Creatinine Clearance: 121.1 mL/min (by C-G formula based on Cr of 0.58).  Recent Labs  05/01/15 2015  [place] 16     Microbiology: Recent Results (from the past 720 hour(s))  Urine culture     Status: None   Collection Time: 04/25/15  4:35 PM  Result Value Ref Range Status   Specimen Description URINE, CLEAN CATCH  Final   Special Requests Normal  Final   Culture   Final    >=100,000 COLONIES/mL ESCHERICHIA COLI AZTREONAM SENSITIVE ZONE 32MM Performed at [facility] Performed at [facility]    Report Status 05/02/2015 FINAL  Final   Organism ID, Bacteria ESCHERICHIA COLI  Final      Susceptibility   Escherichia coli - MIC*    AMPICILLIN <=2 SENSITIVE Sensitive     CEFAZOLIN <=4 SENSITIVE Sensitive     CEFTRIAXONE <=1 SENSITIVE Sensitive     CIPROFLOXACIN >=4 RESISTANT Resistant     GENTAMICIN <=1 SENSITIVE Sensitive     IMIPENEM <=0.25 SENSITIVE Sensitive     NITROFURANTOIN <=16 SENSITIVE Sensitive     TRIMETH/SULFA <=20 SENSITIVE Sensitive     AMPICILLIN/SULBACTAM <=2 SENSITIVE Sensitive     PIP/TAZO <=4 SENSITIVE Sensitive     * >=100,000 COLONIES/mL ESCHERICHIA COLI  Blood culture (routine x 2)     Status: None   Collection Time: 04/25/15  5:00 PM  Result Value Ref Range Status   Specimen Description BLOOD RIGHT ANTECUBITAL  Final   Special Requests BOTTLES DRAWN AEROBIC AND ANAEROBIC 5 CC EA  Final   Culture  Setup Time   Final    GRAM POSITIVE COCCI IN CLUSTERS Gram Stain Report Called to,Read Back By and Verified With: Elveria Rising RN 17:30 04/26/15 ([email or web address]) IN BOTH AEROBIC AND ANAEROBIC BOTTLES    Culture   Final    METHICILLIN RESISTANT STAPHYLOCOCCUS AUREUS MICROAEROPHILIC STREPTOCOCCI Standardized susceptibility testing for this organism is not available. Performed at [facility]    Report Status 04/30/2015 FINAL  Final   Organism ID, Bacteria METHICILLIN RESISTANT STAPHYLOCOCCUS AUREUS  Final      Susceptibility   Methicillin resistant staphylococcus  aureus - MIC*    CIPROFLOXACIN >=8 RESISTANT Resistant     ERYTHROMYCIN >=8 RESISTANT Resistant     GENTAMICIN <=0.5 SENSITIVE Sensitive     OXACILLIN >=4 RESISTANT Resistant     TETRACYCLINE <=1 SENSITIVE Sensitive     VANCOMYCIN <=0.5 SENSITIVE Sensitive     TRIMETH/SULFA >=320 RESISTANT Resistant     CLINDAMYCIN >=8 RESISTANT Resistant     RIFAMPIN <=0.5 SENSITIVE Sensitive     Inducible Clindamycin NEGATIVE Sensitive     * METHICILLIN RESISTANT STAPHYLOCOCCUS AUREUS  Blood culture  (routine x 2)     Status: None   Collection Time: 04/25/15  5:16 PM  Result Value Ref Range Status   Specimen Description BLOOD BLOOD RIGHT FOREARM  Final   Special Requests BOTTLES DRAWN AEROBIC AND ANAEROBIC 5 CC EA  Final   Culture  Setup Time   Final    GRAM POSITIVE COCCI IN CLUSTERS CRITICAL RESULT CALLED TO, READ BACK BY AND VERIFIED WITH: S GRAHAM 04/26/15 @ 38 M VESTAL IN BOTH AEROBIC AND ANAEROBIC BOTTLES    Culture   Final    METHICILLIN RESISTANT STAPHYLOCOCCUS AUREUS SUSCEPTIBILITIES PERFORMED ON PREVIOUS CULTURE WITHIN THE LAST 5 DAYS. MICROAEROPHILIC STREPTOCOCCI Standardized susceptibility testing for this organism is not available. Performed at [facility]    Report Status 05/03/2015 FINAL  Final  MRSA PCR Screening     Status: Abnormal   Collection Time: 04/25/15  6:55 PM  Result Value Ref Range Status   MRSA by PCR POSITIVE (A) NEGATIVE Final    Comment:        The GeneXpert MRSA Assay (FDA approved for NASAL specimens only), is one component of a comprehensive MRSA colonization surveillance program. It is not intended to diagnose MRSA infection nor to guide or monitor treatment for MRSA infections. RESULT CALLED TO, READ BACK BY AND VERIFIED WITH: Sharyn Lull REEVES,RN [id number] @ 2043 BY J SCOTTON   Culture, blood (routine x 2)     Status: None   Collection Time: 04/28/15  1:20 PM  Result Value Ref Range Status   Specimen Description BLOOD LEFT ARM  Final   Special Requests IN PEDIATRIC BOTTLE 1CC  Final   Culture  Setup Time   Final    GRAM POSITIVE COCCI IN CLUSTERS AEROBIC BOTTLE ONLY CRITICAL RESULT CALLED TO, READ BACK BY AND VERIFIED WITH: Hetty Blend RN [id number] 04/30/15 A BROWNING    Culture   Final    MICROAEROPHILIC STREPTOCOCCI Standardized susceptibility testing for this organism is not available. Performed at [facility]    Report Status 05/03/2015 FINAL  Final  Culture, blood (routine x 2)     Status: None   Collection Time:  04/28/15  5:50 PM  Result Value Ref Range Status   Specimen Description BLOOD PICC LINE  Final   Special Requests BOTTLES DRAWN AEROBIC AND ANAEROBIC 5CC EACH  Final   Culture   Final    NO GROWTH 5 DAYS Performed at [facility]    Report Status 05/03/2015 FINAL  Final    Medications:  Scheduled:  . apixaban  5 mg Oral BID  . aztreonam  1 g Intravenous Q8H  . baclofen  10 mg Oral BID WC  . baclofen  20 mg Oral QHS  . feeding supplement (ENSURE ENLIVE)  237 mL Oral BID BM  . feeding supplement (GLUCERNA SHAKE)  237 mL Oral TID BM  . feeding supplement (PRO-STAT SUGAR FREE 64)  30 mL Oral TID BM  .  fentaNYL  50 mcg Transdermal Q72H  . insulin aspart  0-15 Units Subcutaneous TID WC  . insulin glargine  15 Units Subcutaneous Daily  . loratadine  10 mg Oral QHS  . midodrine  20 mg Oral TID WC  . mirtazapine  7.5 mg Oral QHS  . pregabalin  100 mg Oral TID AC  . pregabalin  200 mg Oral QHS  . sodium chloride  10-40 mL Intracatheter Q12H  . vancomycin  750 mg Intravenous Q24H   Infusions:  . sodium chloride    . norepinephrine (LEVOPHED) Adult infusion Stopped (05/02/15 1520)  .  sodium bicarbonate  infusion 1000 mL 75 mL/hr at 05/04/15 0951   PRN:   Assessment: 29 yo male with history of a C6 spinal cord injury, wheelchair bound, with multiple medical problems with frequent visits to the ED. He presents from the wound care clinic with hypotension (BP 60/47). Admitted for further evaluation of sepsis.  Pharmacy consulted for vancomycin and aztreonam dosing for MRSA bacteremia, HCAP, UTI. Also chronic sacral ulcer and pelvic osteomyelitis, right internal obturator abscess. ID following patient.  Anaphylaxis to PCNs/Cephs; rash to Ertapenem.   7/23 TTE could not exclude vegetation or mass in R atrium.   7/18 >> Vancomycin >>  7/18 >> Aztreonam >>   7/18 blood: 2 of 2 MRSA, microaerophilic strep  3/81 urine: >100k E.coli (pansensitive except Cipro; sensitive to aztreonam)   7/18 MRSA PCR: positive  7/21 blood: 1/2 microaerophilic strep  Today, 82/99/3716:  Day #10 Vancomycin and Aztreonam  Temp: low temp of 96.3 recorded this AM  WBC: remains elevated  Renal: SCr low, CrCl >100 ml/min but likely overestimated (quadriplegia with LOW weight with severe muscle wasting)  Dose changes/levels:  7/21: VT at 07:00 = 25 before 5th dose of 750mg  q12h - decrease to q24h.  7/24: VT = 16 on 750mg  q24h  7/27: VT =   Goal of Therapy:  Vancomycin trough level 15-20 mcg/ml  Appropriate antibiotic dosing for renal function; eradication of infection  Plan:  - Continue Vanc 750mg  q24h and Aztreonam 1g q8h  - Would like to check another VT this evening to monitor if stable on current dose.  Hershal Coria, PharmD, BCPS Pager: [phone] 05/04/2015 11:21 AM

## 2015-05-04 NOTE — Progress Notes (Signed)
PULMONARY / CRITICAL CARE MEDICINE   Name: Jason [name] MRN: [id number] DOB: 1986-08-26    ADMISSION DATE:  04/25/2015 CONSULTATION DATE:  05/01/2015  REFERRING MD :  TRH  CHIEF COMPLAINT:  Hypotension  INITIAL PRESENTATION: 29 year old male quad with MRSA bacteremia from multiple sources.  Concern for endocarditis.  Wounds have been checked and followed by general surgery and ortho.  Patient has history of hypotension and concern for septic shock and levophed was started and PCCM consulted.   SUBJECTIVE:  Patient had arterial line come out. No events overnight. Mother reports more somnolence with Dilaudid IV. Denies any pain or chest pressure. No dyspnea or cough. Answering most questions appropriately.  ROS:  No nausea or emesis. No fever, chills, or sweats.  VITAL SIGNS: Temp:  [96.3 F (35.7 C)-98 F (36.7 C)] 96.3 F (35.7 C) (07/27 0800) Resp:  [10-23] 13 (07/27 0500) BP: (97-143)/(64-92) 110/76 mmHg (07/27 0500) SpO2:  [94 %-100 %] 100 % (07/27 0500) Arterial Line BP: (65-121)/(34-66) 121/66 mmHg (07/26 2000) HEMODYNAMICS:   VENTILATOR SETTINGS:   INTAKE / OUTPUT:  Intake/Output Summary (Last 24 hours) at 05/04/15 0843 Last data filed at 05/04/15 0600  Gross per 24 hour  Intake 2912.5 ml  Output   1955 ml  Net  957.5 ml    PHYSICAL EXAMINATION: General:  Comfortable sleeping until awoken. No acute distress. Alert. Mother sleeping in chair at bedside. Neuro:  Able to move upper extremities and follows commands. Answering questions appropriately. Incomplete quadriplegia.  HEENT:  No scleral icterus. Moist mucus membranes. Pupils equal. Cardiovascular:  Sinus rhythm. Regular rate. No appreciable JVD.  Lungs:  CTAB. Normal work of breathing on room air. No accessory muscle use. Abdomen:  Soft. Nondistended. Nontender. Ostomy in place. Musculoskeletal:  Does have joint contractions. No appreciable joint effusions. Skin:  Warm & dry. No rash appreciated.  Dressings in place.  LABS:  CBC  Recent Labs Lab 05/02/15 0536 05/03/15 0440 05/04/15 0530  WBC 31.3* 48.6* 26.1*  HGB 8.5* 8.3* 7.4*  HCT 26.4* 26.3* 23.6*  PLT 535* 672* 594*   Coag's No results for input(s): APTT, INR in the last 168 hours. BMET  Recent Labs Lab 05/01/15 0410 05/02/15 0536 05/03/15 0440  NA 138 134* 133*  K 3.7 4.3 3.9  CL 122* 120* 116*  CO2 14* 13* 14*  BUN 12 14 16   CREATININE 0.52* 0.56* 0.58*  GLUCOSE 102* 303* 287*   Electrolytes  Recent Labs Lab 05/01/15 0410 05/02/15 0536 05/03/15 0440 05/04/15 0530  CALCIUM 7.3* 7.2* 7.4*  --   MG  --  1.1* 1.6* 2.0  PHOS  --  3.2  --   --    Sepsis Markers No results for input(s): LATICACIDVEN, PROCALCITON, O2SATVEN in the last 168 hours. ABG No results for input(s): PHART, PCO2ART, PO2ART in the last 168 hours. Liver Enzymes  Recent Labs Lab 04/28/15 0640  AST 8*  ALT 7*  ALKPHOS 173*  BILITOT 0.4  ALBUMIN 1.1*   Cardiac Enzymes No results for input(s): TROPONINI, PROBNP in the last 168 hours. Glucose  Recent Labs Lab 05/02/15 1705 05/02/15 2144 05/03/15 0744 05/03/15 1132 05/03/15 1650 05/03/15 2205  GLUCAP 215* 215* 263* 206* 152* 162*   Imaging No results found.  ASSESSMENT / PLAN:  PULMONARY Trach, chronic. A:  No Acute Issues  P:   O2 as needed to maintain saturation greater than 90%. Monitor for airway protection.  CARDIOVASCULAR Right Arm PICC 7/19>>> Left Radial Arterial Line 7/24>>>7/26  A:  Hypotension - Chronic.  P:  Levophed titrate for SBP >80 & MAP >55 - off since 7/25. Continue Midodrine 20mg  PO TID   RENAL A:   UOP adequate.  Hyperchloremia - improving. Hypomagnesemia - Resolved.  P:   Monitoring electrolytes daily Currently on serum bicarbonate drip at 75 mL per hour  GASTROINTESTINAL A:   No acute issues.  P:   Diet as ordered.    HEMATOLOGIC A:  Leukocytosis - Improving. Anemia - no signs of active bleeding.  P:   Trend hemoglobin with CBC in a.m. Trending leukocytosis Transfuse per ICU protocol.  INFECTIOUS A:  MRSA bacteremia.   P:   BCx2 MRSA TEE pending IR drainage on hold pending improvement with wound vac.  Vancomycin 7/18>>> Aztreonam  7/18>>>  ENDOCRINE A:   Possible adrenal insufficiency - Cortisol low but no response in BP to stress dosed steroids. Diabetes mellitus - hyperglycemia likely to improve off of hydrocortisone   P:   Continuing Lantus @ 10units qhs Continuing sliding scale insulin with Accu-Cheks    NEUROLOGIC A:  Quad post trauma.  P:   Continuing IV Dilaudid when necessary   We will sign off at this time and be available to assist with any further/future issues. This case was discussed with the patient's mother, attending hospitalist and bedside nurse.  Sonia Baller Ashok Cordia, M.D. [facility] Pulmonary & Critical Care Pager:  [phone] After 3pm or if no response, call [phone]   05/04/2015, 8:43 AM

## 2015-05-04 NOTE — Progress Notes (Signed)
Ok to leave A-line out, per E-link

## 2015-05-05 ENCOUNTER — Encounter (HOSPITAL_COMMUNITY)
Admission: EM | Disposition: A | Discharge status: Home Health | Payer: Self-pay | Source: Home / Self Care | Attending: Internal Medicine

## 2015-05-05 ENCOUNTER — Encounter (HOSPITAL_COMMUNITY): Payer: Self-pay | Admitting: Plastic Surgery

## 2015-05-05 ENCOUNTER — Inpatient Hospital Stay (HOSPITAL_COMMUNITY): Payer: Medicaid Other

## 2015-05-05 ENCOUNTER — Inpatient Hospital Stay (HOSPITAL_COMMUNITY): Payer: Medicaid Other | Admitting: Certified Registered Nurse Anesthetist

## 2015-05-05 ENCOUNTER — Ambulatory Visit (HOSPITAL_COMMUNITY): Admission: RE | Admit: 2015-05-05 | Payer: Medicaid Other | Source: Ambulatory Visit | Admitting: Cardiology

## 2015-05-05 DIAGNOSIS — D62 Acute posthemorrhagic anemia: Secondary | ICD-10-CM

## 2015-05-05 DIAGNOSIS — D638 Anemia in other chronic diseases classified elsewhere: Secondary | ICD-10-CM

## 2015-05-05 DIAGNOSIS — I9589 Other hypotension: Secondary | ICD-10-CM

## 2015-05-05 DIAGNOSIS — R579 Shock, unspecified: Secondary | ICD-10-CM

## 2015-05-05 DIAGNOSIS — R601 Generalized edema: Secondary | ICD-10-CM

## 2015-05-05 DIAGNOSIS — R578 Other shock: Secondary | ICD-10-CM | POA: Insufficient documentation

## 2015-05-05 DIAGNOSIS — I313 Pericardial effusion (noninflammatory): Secondary | ICD-10-CM

## 2015-05-05 HISTORY — PX: TEE WITHOUT CARDIOVERSION: SHX5443

## 2015-05-05 HISTORY — PX: HEMATOMA EVACUATION: SHX5118

## 2015-05-05 HISTORY — PX: INCISION AND DRAINAGE OF WOUND: SHX1803

## 2015-05-05 LAB — GLUCOSE, CAPILLARY
GLUCOSE-CAPILLARY: 180 mg/dL — AB (ref 65–99)
GLUCOSE-CAPILLARY: 170 mg/dL — AB (ref 65–99)
GLUCOSE-CAPILLARY: 152 mg/dL — AB (ref 65–99)
GLUCOSE-CAPILLARY: 144 mg/dL — AB (ref 65–99)
GLUCOSE-CAPILLARY: 170 mg/dL — AB (ref 65–99)
Glucose-Capillary: 141 mg/dL — ABNORMAL HIGH (ref 65–99)
Glucose-Capillary: 141 mg/dL — ABNORMAL HIGH (ref 65–99)

## 2015-05-05 LAB — POCT I-STAT 7, (LYTES, BLD GAS, ICA,H+H)
ACID-BASE DEFICIT: 10 mmol/L — AB (ref 0.0–2.0)
Bicarbonate: 16.1 mEq/L — ABNORMAL LOW (ref 20.0–24.0)
Calcium, Ion: 1.19 mmol/L (ref 1.12–1.23)
HEMATOCRIT: 22 % — AB (ref 39.0–52.0)
HEMOGLOBIN: 7.5 g/dL — AB (ref 13.0–17.0)
O2 Saturation: 98 %
PCO2 ART: 36.7 mmHg (ref 35.0–45.0)
Potassium: 3.7 mmol/L (ref 3.5–5.1)
Sodium: 140 mmol/L (ref 135–145)
TCO2: 17 mmol/L (ref 0–100)
pH, Arterial: 7.251 — ABNORMAL LOW (ref 7.350–7.450)
pO2, Arterial: 118 mmHg — ABNORMAL HIGH (ref 80.0–100.0)

## 2015-05-05 LAB — BASIC METABOLIC PANEL
ANION GAP: 3 — AB (ref 5–15)
Anion gap: 2 — ABNORMAL LOW (ref 5–15)
BUN: 18 mg/dL (ref 6–20)
BUN: 14 mg/dL (ref 6–20)
CHLORIDE: 112 mmol/L — AB (ref 101–111)
CO2: 18 mmol/L — ABNORMAL LOW (ref 22–32)
CO2: 21 mmol/L — AB (ref 22–32)
Calcium: 7.4 mg/dL — ABNORMAL LOW (ref 8.9–10.3)
Calcium: 7 mg/dL — ABNORMAL LOW (ref 8.9–10.3)
Chloride: 112 mmol/L — ABNORMAL HIGH (ref 101–111)
Creatinine, Ser: 0.6 mg/dL — ABNORMAL LOW (ref 0.61–1.24)
Creatinine, Ser: 0.58 mg/dL — ABNORMAL LOW (ref 0.61–1.24)
GFR calc Af Amer: >60 mL/min (ref >60)
GFR calc Af Amer: >60 mL/min (ref >60)
GFR calc non Af Amer: >60 mL/min (ref >60)
GLUCOSE: 169 mg/dL — AB (ref 65–99)
GLUCOSE: 130 mg/dL — AB (ref 65–99)
Potassium: 3.8 mmol/L (ref 3.5–5.1)
Potassium: 3.4 mmol/L — ABNORMAL LOW (ref 3.5–5.1)
SODIUM: 133 mmol/L — AB (ref 135–145)
SODIUM: 135 mmol/L (ref 135–145)

## 2015-05-05 LAB — MAGNESIUM
MAGNESIUM: 1.7 mg/dL (ref 1.7–2.4)
MAGNESIUM: 1.5 mg/dL — AB (ref 1.7–2.4)

## 2015-05-05 LAB — CBC WITH DIFFERENTIAL/PLATELET
BASOS PCT: 0 % (ref 0–1)
Basophils Absolute: 0 10*3/uL (ref 0.0–0.1)
Eosinophils Absolute: 0.2 10*3/uL (ref 0.0–0.7)
Eosinophils Relative: 1 % (ref 0–5)
HCT: 21.6 % — ABNORMAL LOW (ref 39.0–52.0)
Hemoglobin: 6.9 g/dL — CL (ref 13.0–17.0)
LYMPHS ABS: 2.2 10*3/uL (ref 0.7–4.0)
LYMPHS PCT: 9 % — AB (ref 12–46)
MCH: 25.6 pg — AB (ref 26.0–34.0)
MCHC: 31.9 g/dL (ref 30.0–36.0)
MCV: 80 fL (ref 78.0–100.0)
Monocytes Absolute: 2 10*3/uL — ABNORMAL HIGH (ref 0.1–1.0)
Monocytes Relative: 8 % (ref 3–12)
Neutro Abs: 20.3 10*3/uL — ABNORMAL HIGH (ref 1.7–7.7)
Neutrophils Relative %: 82 % — ABNORMAL HIGH (ref 43–77)
PLATELETS: 565 10*3/uL — AB (ref 150–400)
RBC: 2.7 MIL/uL — AB (ref 4.22–5.81)
RDW: 17.5 % — ABNORMAL HIGH (ref 11.5–15.5)
WBC: 24.7 10*3/uL — ABNORMAL HIGH (ref 4.0–10.5)

## 2015-05-05 LAB — VANCOMYCIN, RANDOM: Vancomycin Rm: 16 ug/mL

## 2015-05-05 LAB — POCT I-STAT 4, (NA,K, GLUC, HGB,HCT)
Glucose, Bld: 130 mg/dL — ABNORMAL HIGH (ref 65–99)
HCT: 12 % — ABNORMAL LOW (ref 39.0–52.0)
Hemoglobin: 4.1 g/dL — CL (ref 13.0–17.0)
Potassium: 3.7 mmol/L (ref 3.5–5.1)
SODIUM: 138 mmol/L (ref 135–145)

## 2015-05-05 LAB — PREPARE RBC (CROSSMATCH)

## 2015-05-05 SURGERY — ECHOCARDIOGRAM, TRANSESOPHAGEAL
Anesthesia: Moderate Sedation

## 2015-05-05 SURGERY — IRRIGATION AND DEBRIDEMENT WOUND
Anesthesia: General

## 2015-05-05 SURGERY — IRRIGATION AND DEBRIDEMENT WOUND
Anesthesia: Monitor Anesthesia Care | Site: Buttocks | Laterality: Bilateral

## 2015-05-05 SURGERY — EVACUATION HEMATOMA
Anesthesia: General

## 2015-05-05 MED ORDER — FUROSEMIDE 10 MG/ML IJ SOLN
40.0000 mg | Freq: Once | INTRAMUSCULAR | Status: AC
  Administered 2015-05-05: 40 mg via INTRAVENOUS
  Filled 2015-05-05: qty 4

## 2015-05-05 MED ORDER — SODIUM CHLORIDE 0.9 % IV SOLN: INTRAVENOUS | Status: DC

## 2015-05-05 MED ORDER — PHENYLEPHRINE HCL 10 MG/ML IJ SOLN
0.0000 ug/min | INTRAVENOUS | Status: DC
  Administered 2015-05-05: 20 ug/min via INTRAVENOUS

## 2015-05-05 MED ORDER — LIDOCAINE HCL (CARDIAC) 20 MG/ML IV SOLN
INTRAVENOUS | Status: DC | PRN
  Administered 2015-05-05: 50 mg via INTRAVENOUS

## 2015-05-05 MED ORDER — FENTANYL CITRATE (PF) 100 MCG/2ML IJ SOLN
25.0000 ug | INTRAMUSCULAR | Status: DC | PRN
  Administered 2015-05-05 (×4): 25 ug via INTRAVENOUS

## 2015-05-05 MED ORDER — FENTANYL CITRATE (PF) 100 MCG/2ML IJ SOLN
INTRAMUSCULAR | Status: DC | PRN
  Administered 2015-05-05: 25 ug via INTRAVENOUS

## 2015-05-05 MED ORDER — DEXTROSE 5 % IV SOLN
0.0000 ug/min | INTRAVENOUS | Status: DC
  Administered 2015-05-05: 20 ug/min via INTRAVENOUS
  Filled 2015-05-05: qty 1

## 2015-05-05 MED ORDER — PROPOFOL 10 MG/ML IV BOLUS
INTRAVENOUS | Status: DC | PRN
  Administered 2015-05-05: 140 mg via INTRAVENOUS

## 2015-05-05 MED ORDER — LACTATED RINGERS IV SOLN
INTRAVENOUS | Status: DC | PRN
  Administered 2015-05-05: 12:00:00 via INTRAVENOUS

## 2015-05-05 MED ORDER — MIDAZOLAM HCL 10 MG/2ML IJ SOLN
INTRAMUSCULAR | Status: DC | PRN
  Administered 2015-05-05: 2 mg via INTRAVENOUS

## 2015-05-05 MED ORDER — DEXTROSE 5 % IV SOLN
INTRAVENOUS | Status: DC
  Administered 2015-05-05: via INTRAVENOUS
  Filled 2015-05-05 (×3): qty 150

## 2015-05-05 MED ORDER — SODIUM CHLORIDE 0.9 % IR SOLN
Status: DC | PRN
  Administered 2015-05-05: 1000 mL

## 2015-05-05 MED ORDER — ONDANSETRON HCL 4 MG/2ML IJ SOLN
INTRAMUSCULAR | Status: DC | PRN
  Administered 2015-05-05: 4 mg via INTRAVENOUS

## 2015-05-05 MED ORDER — DEXTROSE 5 % IV SOLN
0.0000 ug/min | INTRAVENOUS | Status: DC
  Filled 2015-05-05: qty 1

## 2015-05-05 MED ORDER — VANCOMYCIN HCL IN DEXTROSE 750-5 MG/150ML-% IV SOLN
750.0000 mg | INTRAVENOUS | Status: DC
  Administered 2015-05-06 – 2015-05-12 (×4): 750 mg via INTRAVENOUS
  Filled 2015-05-05 (×5): qty 150

## 2015-05-05 MED ORDER — SODIUM CHLORIDE 0.9 % IV SOLN
Freq: Once | INTRAVENOUS | Status: AC
  Administered 2015-05-05: 08:00:00 via INTRAVENOUS

## 2015-05-05 MED ORDER — BUTAMBEN-TETRACAINE-BENZOCAINE 2-2-14 % EX AERO
INHALATION_SPRAY | CUTANEOUS | Status: DC | PRN
Start: 2015-05-05 — End: 2015-05-05
  Administered 2015-05-05: 2 via TOPICAL

## 2015-05-05 MED ORDER — LACTATED RINGERS IV SOLN
INTRAVENOUS | Status: DC
  Administered 2015-05-05: 12:00:00 via INTRAVENOUS

## 2015-05-05 MED ORDER — ONDANSETRON HCL 4 MG/2ML IJ SOLN: 4.0000 mg | Freq: Once | INTRAMUSCULAR | Status: DC | PRN

## 2015-05-05 MED ORDER — FENTANYL CITRATE (PF) 100 MCG/2ML IJ SOLN
INTRAMUSCULAR | Status: DC | PRN
  Administered 2015-05-05: 50 ug via INTRAVENOUS

## 2015-05-05 SURGICAL SUPPLY — 42 items
BAG DECANTER FOR FLEXI CONT (MISCELLANEOUS) IMPLANT
BENZOIN TINCTURE PRP APPL 2/3 (GAUZE/BANDAGES/DRESSINGS) ×3 IMPLANT
BNDG GAUZE ELAST 4 BULKY (GAUZE/BANDAGES/DRESSINGS) ×3 IMPLANT
CANISTER SUCTION 2500CC (MISCELLANEOUS) ×3 IMPLANT
CONT SPEC STER OR (MISCELLANEOUS) IMPLANT
COVER SURGICAL LIGHT HANDLE (MISCELLANEOUS) ×3 IMPLANT
DRAPE IMP U-DRAPE 54X76 (DRAPES) ×3 IMPLANT
DRAPE INCISE IOBAN 66X45 STRL (DRAPES) IMPLANT
DRAPE LAPAROSCOPIC ABDOMINAL (DRAPES) IMPLANT
DRAPE PED LAPAROTOMY (DRAPES) ×3 IMPLANT
DRAPE PROXIMA HALF (DRAPES) IMPLANT
DRSG ADAPTIC 3X8 NADH LF (GAUZE/BANDAGES/DRESSINGS) IMPLANT
DRSG PAD ABDOMINAL 8X10 ST (GAUZE/BANDAGES/DRESSINGS) IMPLANT
DRSG VAC ATS LRG SENSATRAC (GAUZE/BANDAGES/DRESSINGS) ×3 IMPLANT
DRSG VAC ATS MED SENSATRAC (GAUZE/BANDAGES/DRESSINGS) IMPLANT
DRSG VAC ATS SM SENSATRAC (GAUZE/BANDAGES/DRESSINGS) IMPLANT
ELECT CAUTERY BLADE 6.4 (BLADE) IMPLANT
ELECT REM PT RETURN 9FT ADLT (ELECTROSURGICAL) ×3
ELECTRODE REM PT RTRN 9FT ADLT (ELECTROSURGICAL) ×1 IMPLANT
GAUZE SPONGE 4X4 12PLY STRL (GAUZE/BANDAGES/DRESSINGS) IMPLANT
GLOVE BIO SURGEON STRL SZ 6.5 (GLOVE) ×2 IMPLANT
GLOVE BIO SURGEONS STRL SZ 6.5 (GLOVE) ×1
GOWN STRL REUS W/ TWL LRG LVL3 (GOWN DISPOSABLE) ×3 IMPLANT
GOWN STRL REUS W/TWL LRG LVL3 (GOWN DISPOSABLE) ×6
KIT BASIN OR (CUSTOM PROCEDURE TRAY) ×3 IMPLANT
KIT ROOM TURNOVER OR (KITS) ×3 IMPLANT
MATRIX SURGICAL PSM 7X10CM (Tissue) ×3 IMPLANT
MICROMATRIX 500MG (Tissue) ×12 IMPLANT
NS IRRIG 1000ML POUR BTL (IV SOLUTION) ×3 IMPLANT
PACK GENERAL/GYN (CUSTOM PROCEDURE TRAY) ×3 IMPLANT
PACK UNIVERSAL I (CUSTOM PROCEDURE TRAY) ×3 IMPLANT
PAD ABD 8X10 STRL (GAUZE/BANDAGES/DRESSINGS) ×3 IMPLANT
PAD ARMBOARD 7.5X6 YLW CONV (MISCELLANEOUS) ×6 IMPLANT
SOLUTION PARTIC MCRMTRX 500MG (Tissue) ×4 IMPLANT
STAPLER VISISTAT 35W (STAPLE) ×3 IMPLANT
SURGILUBE 2OZ TUBE FLIPTOP (MISCELLANEOUS) IMPLANT
SUT VIC AB 5-0 PS2 18 (SUTURE) IMPLANT
SWAB COLLECTION DEVICE MRSA (MISCELLANEOUS) IMPLANT
TAPE CLOTH SURG 6X10 WHT LF (GAUZE/BANDAGES/DRESSINGS) ×3 IMPLANT
TOWEL OR 17X26 10 PK STRL BLUE (TOWEL DISPOSABLE) ×3 IMPLANT
TUBE ANAEROBIC SPECIMEN COL (MISCELLANEOUS) IMPLANT
UNDERPAD 30X30 INCONTINENT (UNDERPADS AND DIAPERS) ×3 IMPLANT

## 2015-05-05 SURGICAL SUPPLY — 27 items
BLADE 10 SAFETY STRL DISP (BLADE) IMPLANT
BNDG GAUZE ELAST 4 BULKY (GAUZE/BANDAGES/DRESSINGS) ×3 IMPLANT
CANISTER SUCTION 2500CC (MISCELLANEOUS) IMPLANT
COVER SURGICAL LIGHT HANDLE (MISCELLANEOUS) IMPLANT
ELECT BLADE 4.0 EZ CLEAN MEGAD (MISCELLANEOUS) ×3
ELECT NEEDLE TIP 2.8 STRL (NEEDLE) IMPLANT
ELECT REM PT RETURN 9FT ADLT (ELECTROSURGICAL) ×3
ELECTRODE BLDE 4.0 EZ CLN MEGD (MISCELLANEOUS) ×1 IMPLANT
ELECTRODE REM PT RTRN 9FT ADLT (ELECTROSURGICAL) ×1 IMPLANT
GAUZE SPONGE 4X4 12PLY STRL (GAUZE/BANDAGES/DRESSINGS) ×15 IMPLANT
GLOVE BIO SURGEON STRL SZ 6.5 (GLOVE) IMPLANT
GLOVE BIO SURGEONS STRL SZ 6.5 (GLOVE)
GOWN STRL REUS W/ TWL LRG LVL3 (GOWN DISPOSABLE) IMPLANT
GOWN STRL REUS W/TWL LRG LVL3 (GOWN DISPOSABLE)
HEMOSTAT SNOW SURGICEL 2X4 (HEMOSTASIS) ×24 IMPLANT
HEMOSTAT SURGICEL 2X4 FIBR (HEMOSTASIS) ×3 IMPLANT
KIT BASIN OR (CUSTOM PROCEDURE TRAY) IMPLANT
KIT ROOM TURNOVER OR (KITS) IMPLANT
NS IRRIG 1000ML POUR BTL (IV SOLUTION) IMPLANT
PACK GENERAL/GYN (CUSTOM PROCEDURE TRAY) IMPLANT
PAD ABD 8X10 STRL (GAUZE/BANDAGES/DRESSINGS) ×3 IMPLANT
PAD ARMBOARD 7.5X6 YLW CONV (MISCELLANEOUS) IMPLANT
PENCIL BUTTON BLDE SNGL 10FT (ELECTRODE) ×3 IMPLANT
SPONGE LAP 18X18 X RAY DECT (DISPOSABLE) ×6 IMPLANT
TAPE CLOTH SURG 6X10 WHT LF (GAUZE/BANDAGES/DRESSINGS) ×3 IMPLANT
TOWEL OR 17X24 6PK STRL BLUE (TOWEL DISPOSABLE) IMPLANT
TOWEL OR 17X26 10 PK STRL BLUE (TOWEL DISPOSABLE) IMPLANT

## 2015-05-05 NOTE — Anesthesia Postprocedure Evaluation (Signed)
Anesthesia Post Note  Patient: Jason [name]  Procedure(s) Performed: Procedure(s) (LRB): IRRIGATION AND DEBRIDEMENT SACRAL ULCER (Bilateral)  Anesthesia type: General  Patient location: PACU  Post pain: Pain level controlled  Post assessment: Post-op Vital signs reviewed  Last Vitals: BP 85/52 mmHg  Pulse 62  Temp(Src) 36 C (Oral)  Resp 16  Ht 5\' 8"  (1.727 m)  Wt 139 lb 12.4 oz (63.4 kg)  BMI 21.26 kg/m2  SpO2 100%  Post vital signs: Reviewed  Level of consciousness: sedated  PACU course: I was called to bedside for hypotension post op. When I arrived, the pt was in lateral position with Dr. Migdalia Dk cauterizing sacral wounds that were bleeding. The BP was 70s/40s by cuff. I immediately called for PRBC, but pt had not had a type and cross sent prior to OR. Initial iSTAT Hgb 4.1, but was drawn off PICC line. Phenylephrine started. Crystalloid volume given. Emergency release given 2u. Crossmatch, CBC, PT/INR sent, all from PICC line. Arterial line and left EJ placed by Juliane Lack, CRNA. Pt was lucid, though drowsy through all of this. Arterial iSTAT drawn, Hgb 7.5, but pH 7.25, likely from hypoperfusion. Two crossmatched units were also given.

## 2015-05-05 NOTE — Progress Notes (Signed)
Late entry: Emergency release 2 units red cells given by anesthesia staff unit# [id number], unit# [id number] via blood warmer around 1637 and 1623 respectively. Crossmatch 2 units red cells also given by anesthesia unit# [id number], unit# [id number] at 1702and 1705. Phenylephrine started at 73mcg/min, titrated as needed.

## 2015-05-05 NOTE — OR Nursing (Signed)
Timeout preformed and list of allergies reviewed with Dr. Stanford Breed.  Patient was informed that his throat would be sprayed with a numbing medicine.  Patient was sprayed twice with Cetacaine. In the medication documentation tab Cetacaine was high lighted with a yellow anaphalyxis notation.  Dr. Stanford Breed was aware and patient was closely monitored for respiratory distress.  TEE completed without incident.  Pharmacy called about the discrepancy. Patient was closed observed during the recovery phase without incident.

## 2015-05-05 NOTE — Op Note (Signed)
Operative Note   DATE OF OPERATION: 05/05/2015  LOCATION: [name]  SURGICAL DIVISION: Plastic Surgery  PREOPERATIVE DIAGNOSES:  1 Sacral and 2 ischial ulcer (5 x 7 x 2 cm sacral and 4 x 6 x 4 cm ischial x 2)   POSTOPERATIVE DIAGNOSES:  same  PROCEDURE:  Preparation of sacral ulcer with placement of Acell (3 gm and sheets 7 x 10 cm) and VAC. Debridement of ischial ulcer skin, soft tissue, muscle and bone.  SURGEON: Theodoro Kos, DO  ASSISTANT: Shawn Rayburn, PA  ANESTHESIA:  General.   COMPLICATIONS: None.   INDICATIONS FOR PROCEDURE:  The patient, Jason [name] is a 29 y.o. male born on Oct 18, 1985, is here for treatment of sacral ischial ulcers. MRN: [id number]  CONSENT:  Informed consent was obtained directly from the patient. Risks, benefits and alternatives were fully discussed. Specific risks including but not limited to bleeding, infection, hematoma, seroma, scarring, pain, infection, contracture, asymmetry, wound healing problems, and need for further surgery were all discussed. The patient did have an ample opportunity to have questions answered to satisfaction.   DESCRIPTION OF PROCEDURE:  The patient was taken to the operating room. The patient's operative site was prepped and draped in a sterile fashion. A time out was performed and all information was confirmed to be correct.  General  anesthesia was administered.  The #10 blade and scissors were used to debride the area of the sacral and ischial ulcers as stated above.  The area was irrigated.  A biopsy for micro was obtained from the right ischial ulcer and debridement was done of skin, soft tissue and bone. The bovie was used for hemostasis.  The ischial ulcers were packed with kerlex. The Acell powder 3 gm and sheet 7 x 10 cm was applied to the entire sacral wound of equal size.  An adaptic, KY gel and VAC was placed and there was an excellent seal.   Xeroform and antibiotic ointment with ABDs were  placed on the back wound.  The patient tolerated the procedure well.  There were no complications. The patient was allowed to wake from anesthesia, extubated and taken to the recovery room in satisfactory condition.

## 2015-05-05 NOTE — Progress Notes (Signed)
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 He was admitted for further evaluation of sepsis. His blood cultures revealed MRSA and has been on vancomycin. ID on board. MRI of the pelvis revealed a small RIGHT internal obturator abscess and initial plan was to undergo CT guided aspiration by IR. Dr Earleen Newport spoke to Dr Migdalia Dk, who have decided to watch him on IV Antibiotics for now. CCM signed off 7/27. Off pressors since 7/25. Cardiology consulted for possible TEE 7/28. Apparently scheduled for surgery by plastic surgery on 7/28 at 5 PM.   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 - Continue midodrine - Off levo fed since 7/25 - Stable/resolved.  MRSA bacteremia - Most likely related to decubitus ulcer and pelvic osteomyelitis - Blood cultures 2 from 7/18: MRSA. Surveillance blood culture 1 on  7/21: Negative. Surveillance blood culture 2 on 3/54: Microaerophilic streptococci. - Currently on IV vancomycin and aztreonam per pharmacy - Infectious disease following. - IR drainage on hold pending improvement with wound VAC. As per nurse's report, supposed to have surgery by Dr. Amelia Jo on 7/28 at 5 PM. - TEE 7/28: No vegetations.  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 - MRI results as below. - Status post bedside debridement by CCS. - Orthopedics was consulted and recommended IR for aspiration of obturator abscess. IR discussed with plastic surgery and  indicated that his abscess is communicating with external wounds and should improve with wound VAC and antibiotics. Aspiration was held. - Plan for I&D on 7/28 - Continue vancomycin and aztreonam  Uncontrolled type I DM with neuropathy - Continue Lantus and SSI. CBGs reasonable  Possible adrenal insufficiency - Cortisol low but no response in blood pressure to stress dose steroids. Steroids discontinued.  Acute encephalopathy - As per discussion with patient's mother at bedside on 7/27, patient intermittently confused, drowsy and slurred speech which she suspects related to IV Dilaudid. - DC IV Dilaudid and minimize narcotics. Use IV Toradol briefly for severe pain.  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Hypokalemia/hypomagnesemia - Replace as needed and follow periodically.  Non-anion gap metabolic acidosis - Patient on bicarbonate drip. Improving. Continue bicarbonate drip.  Escherichia coli UTI - Continue aztreonam  Severe leukocytosis - May be related to recent steroids. Improving  Reactive thrombocytosis - Follow CBCs  Severe protein calorie malnutrition - Continue nutritional supplements.  Anemia of chronic disease - Hemoglobin down to 6.9 on 7/28. We'll transfuse 2 units of PRBCs-patient likely to have some blood loss during I&D later today.  Adult failure to thrive - Secondary to complex chronic multiple medical problems  History of pulmonary embolism - continue Apixaban  ? HCAP - Per ID  Anasarca - Secondary to poor nutrition/hypoalbuminemia, volume resuscitation - IV Lasix 1 today and if blood pressure tolerates, may consider scheduled for a couple of days.    DVT prophylaxis: On full dose of Apixaban Code Status: Full Family Communication: Discussed at length with patient's mother at bedside on 7/28. Disposition Plan: To be determined. Continue to manage in stepdown unit. Not medically stable for discharge.   Consultants:  CCM-signed off 7/27  Infectious  disease  General surgery  Orthopedics  Plastic surgery  Interventional radiology  Cardiology-for TEE  Procedures:  L heel ulcer status post debridement on 04/30/15  Bilateral ischial wounds and sacral wound debridement by general surgery on 7/23  RUE PICC line  Suprapubic catheter  LLQ colostomy  Sacral wound VAC  TEE 05/05/15: No vegetations  Antibiotics:  IV aztreonam 7/18 >   IV vancomycin 7/18 >  Subjective: IV Dilaudid discontinued 7/27. Patient had more pain last night and oral narcotics with resumed. As per mother, mental status significantly improved but not yet at baseline. Chronic sacral pain.  Objective: Filed Vitals:   05/05/15 1000 05/05/15 1005 05/05/15 1010 05/05/15 1015  BP: 119/87 110/76 111/77   Pulse: 67 66 65 69  Temp:      TempSrc:      Resp: 19 8 10 17   Height:      Weight:      SpO2: 100% 100% 100% 94%    Intake/Output Summary (Last 24 hours) at 05/05/15 1016 Last data filed at 05/05/15 0930  Gross per 24 hour  Intake   2590 ml  Output   2510 ml  Net     80 ml   Filed Weights   04/29/15 0545 04/30/15 0400 05/01/15 0427  Weight: 59.4 kg (130 lb 15.3 oz) 61.1 kg (134 lb 11.2 oz) 63.4 kg (139 lb 12.4 oz)     Exam:  General exam: Moderately built and frail/cachectic chronically ill-looking male lying comfortably propped up in bed. Respiratory system: Reduced breath sounds in the bases. Rest of lung fields clear to auscultation. No increased work of breathing. Cardiovascular system: S1 & S2 heard, RRR. No JVD, murmurs, gallops, clicks. Telemetry: Sinus rhythm. Gastrointestinal system: Abdomen is nondistended, soft and nontender. Normal bowel sounds heard. Left colostomy with small liquid stools. Suprapubic catheter intact. Sacral wound VAC +. Penile edema + + Central nervous system: Alert and oriented to person and place. No focal neurological deficits. Extremities: Chronic quadriplegia. 1+ pitting bilateral leg edema.   Data  Reviewed: Basic Metabolic Panel:  Recent Labs Lab 04/30/15 0440 05/01/15 0410 05/02/15 0536 05/03/15 0440 05/04/15 0530 05/04/15 1440 05/05/15 0400  NA 135 138 134* 133*  --  130* 133*  K 3.1* 3.7 4.3 3.9  --  3.2* 3.8  CL 119* 122* 120* 116*  --  111 112*  CO2 13* 14* 13* 14*  --  17* 18*  GLUCOSE 178* 102* 303* 287*  --  175* 169*  BUN 12 12 14 16   --  17 18  CREATININE 0.46* 0.52* 0.56* 0.58*  --  0.59* 0.60*  CALCIUM 7.5* 7.3* 7.2* 7.4*  --  7.4* 7.4*  MG 1.1*  --  1.1* 1.6* 2.0  --  1.7  PHOS  --   --  3.2  --   --   --   --    Liver Function Tests: No results for input(s): AST, ALT, ALKPHOS, BILITOT, PROT, ALBUMIN in the last 168 hours. No results for input(s): LIPASE, AMYLASE in the last 168 hours. No results for input(s): AMMONIA in the last 168 hours. CBC:  Recent Labs Lab 05/01/15 0410 05/02/15 0536 05/03/15 0440 05/04/15 0530 05/05/15 0400  WBC 26.0* 31.3* 48.6* 26.1* 24.7*  NEUTROABS 19.8* 29.4* 42.3* 21.7* 20.3*  HGB 8.2* 8.5* 8.3* 7.4* 6.9*  HCT 25.9* 26.4* 26.3* 23.6* 21.6*  MCV 81.2 80.7 80.9 79.7 80.0  PLT 600* 535* 672* 594* 565*  Cardiac Enzymes: No results for input(s): CKTOTAL, CKMB, CKMBINDEX, TROPONINI in the last 168 hours. BNP (last 3 results) No results for input(s): PROBNP in the last 8760 hours. CBG:  Recent Labs Lab 05/04/15 1214 05/04/15 1629 05/04/15 2249 05/05/15 0807 05/05/15 0943  GLUCAP 166* 152* 180* 170* 152*    Recent Results (from the past 240 hour(s))  Urine culture     Status: None   Collection Time: 04/25/15  4:35 PM  Result Value Ref Range Status   Specimen Description URINE, CLEAN CATCH  Final   Special Requests Normal  Final   Culture   Final    >=100,000 COLONIES/mL ESCHERICHIA COLI AZTREONAM SENSITIVE ZONE 32MM Performed at [facility] Performed at [facility]    Report Status 05/02/2015 FINAL  Final   Organism ID, Bacteria ESCHERICHIA COLI  Final      Susceptibility   Escherichia  coli - MIC*    AMPICILLIN <=2 SENSITIVE Sensitive     CEFAZOLIN <=4 SENSITIVE Sensitive     CEFTRIAXONE <=1 SENSITIVE Sensitive     CIPROFLOXACIN >=4 RESISTANT Resistant     GENTAMICIN <=1 SENSITIVE Sensitive     IMIPENEM <=0.25 SENSITIVE Sensitive     NITROFURANTOIN <=16 SENSITIVE Sensitive     TRIMETH/SULFA <=20 SENSITIVE Sensitive     AMPICILLIN/SULBACTAM <=2 SENSITIVE Sensitive     PIP/TAZO <=4 SENSITIVE Sensitive     * >=100,000 COLONIES/mL ESCHERICHIA COLI  Blood culture (routine x 2)     Status: None   Collection Time: 04/25/15  5:00 PM  Result Value Ref Range Status   Specimen Description BLOOD RIGHT ANTECUBITAL  Final   Special Requests BOTTLES DRAWN AEROBIC AND ANAEROBIC 5 CC EA  Final   Culture  Setup Time   Final    GRAM POSITIVE COCCI IN CLUSTERS Gram Stain Report Called to,Read Back By and Verified With: Elveria Rising RN 17:30 04/26/15 ([email or web address]) IN BOTH AEROBIC AND ANAEROBIC BOTTLES    Culture   Final    METHICILLIN RESISTANT STAPHYLOCOCCUS AUREUS MICROAEROPHILIC STREPTOCOCCI Standardized susceptibility testing for this organism is not available. Performed at [facility]    Report Status 04/30/2015 FINAL  Final   Organism ID, Bacteria METHICILLIN RESISTANT STAPHYLOCOCCUS AUREUS  Final      Susceptibility   Methicillin resistant staphylococcus aureus - MIC*    CIPROFLOXACIN >=8 RESISTANT Resistant     ERYTHROMYCIN >=8 RESISTANT Resistant     GENTAMICIN <=0.5 SENSITIVE Sensitive     OXACILLIN >=4 RESISTANT Resistant     TETRACYCLINE <=1 SENSITIVE Sensitive     VANCOMYCIN <=0.5 SENSITIVE Sensitive     TRIMETH/SULFA >=320 RESISTANT Resistant     CLINDAMYCIN >=8 RESISTANT Resistant     RIFAMPIN <=0.5 SENSITIVE Sensitive     Inducible Clindamycin NEGATIVE Sensitive     * METHICILLIN RESISTANT STAPHYLOCOCCUS AUREUS  Blood culture (routine x 2)     Status: None   Collection Time: 04/25/15  5:16 PM  Result Value Ref Range Status   Specimen Description BLOOD BLOOD  RIGHT FOREARM  Final   Special Requests BOTTLES DRAWN AEROBIC AND ANAEROBIC 5 CC EA  Final   Culture  Setup Time   Final    GRAM POSITIVE COCCI IN CLUSTERS CRITICAL RESULT CALLED TO, READ BACK BY AND VERIFIED WITH: S GRAHAM 04/26/15 @ 30 M VESTAL IN BOTH AEROBIC AND ANAEROBIC BOTTLES    Culture   Final    METHICILLIN RESISTANT STAPHYLOCOCCUS AUREUS SUSCEPTIBILITIES PERFORMED ON PREVIOUS CULTURE WITHIN THE LAST 5  DAYS. MICROAEROPHILIC STREPTOCOCCI Standardized susceptibility testing for this organism is not available. Performed at [facility]    Report Status 05/03/2015 FINAL  Final  MRSA PCR Screening     Status: Abnormal   Collection Time: 04/25/15  6:55 PM  Result Value Ref Range Status   MRSA by PCR POSITIVE (A) NEGATIVE Final    Comment:        The GeneXpert MRSA Assay (FDA approved for NASAL specimens only), is one component of a comprehensive MRSA colonization surveillance program. It is not intended to diagnose MRSA infection nor to guide or monitor treatment for MRSA infections. RESULT CALLED TO, READ BACK BY AND VERIFIED WITH: Sharyn Lull REEVES,RN [id number] @ 2043 BY J SCOTTON   Culture, blood (routine x 2)     Status: None   Collection Time: 04/28/15  1:20 PM  Result Value Ref Range Status   Specimen Description BLOOD LEFT ARM  Final   Special Requests IN PEDIATRIC BOTTLE 1CC  Final   Culture  Setup Time   Final    GRAM POSITIVE COCCI IN CLUSTERS AEROBIC BOTTLE ONLY CRITICAL RESULT CALLED TO, READ BACK BY AND VERIFIED WITH: Hetty Blend RN 2222 04/30/15 A BROWNING    Culture   Final    MICROAEROPHILIC STREPTOCOCCI Standardized susceptibility testing for this organism is not available. Performed at [facility]    Report Status 05/03/2015 FINAL  Final  Culture, blood (routine x 2)     Status: None   Collection Time: 04/28/15  5:50 PM  Result Value Ref Range Status   Specimen Description BLOOD PICC LINE  Final   Special Requests BOTTLES DRAWN  AEROBIC AND ANAEROBIC 5CC EACH  Final   Culture   Final    NO GROWTH 5 DAYS Performed at [facility]    Report Status 05/03/2015 FINAL  Final         Studies: No results found.      Scheduled Meds: . apixaban  5 mg Oral BID  . aztreonam  1 g Intravenous Q8H  . baclofen  10 mg Oral BID WC  . baclofen  20 mg Oral QHS  . feeding supplement (ENSURE ENLIVE)  237 mL Oral BID BM  . feeding supplement (GLUCERNA SHAKE)  237 mL Oral TID BM  . feeding supplement (PRO-STAT SUGAR FREE 64)  30 mL Oral TID BM  . fentaNYL  50 mcg Transdermal Q72H  . insulin aspart  0-15 Units Subcutaneous TID WC  . insulin glargine  15 Units Subcutaneous Daily  . loratadine  10 mg Oral QHS  . midodrine  20 mg Oral TID WC  . mirtazapine  7.5 mg Oral QHS  . pantoprazole  40 mg Oral Daily  . pregabalin  100 mg Oral TID AC  . pregabalin  200 mg Oral QHS  . sodium chloride  10-40 mL Intracatheter Q12H   Continuous Infusions: . sodium chloride 500 mL (05/05/15 0938)  . sodium chloride    .  sodium bicarbonate  infusion 1000 mL 75 mL/hr at 05/05/15 0800    Active Problems:   Type 1 diabetes, uncontrolled, with neuropathy   Spinal cord infarction (history of)   Sepsis   Stage IV decubitus ulcer   Urinary tract infectious disease   HCAP (healthcare-associated pneumonia)   Hypotension   Abdominopelvic abscess    Time spent: 25 minutes    Tron Flythe, MD, FACP, FHM. [facility] Pager [phone]  If 7PM-7AM, please contact night-coverage [email or web address] Password [facility] 05/05/2015, 10:16  AM    LOS: 10 days

## 2015-05-05 NOTE — Progress Notes (Signed)
ANTIBIOTIC CONSULT NOTE - Follow Up  Pharmacy Consult for Vancomycin, Aztreonam Indication: MRSA bacteremia, HCAP, UTI  Allergies  Allergen Reactions  . Cefuroxime Axetil Anaphylaxis  . Ertapenem Other (See Comments)    Rash and confusion  . Morphine And Related Other (See Comments)    Changed mental status, confusion, headache, visual hallucination  . Penicillins Anaphylaxis and Other (See Comments)    ?can take amoxicillin?  . Sulfa Antibiotics Anaphylaxis, Shortness Of Breath and Other (See Comments)  . Tessalon [Benzonatate] Anaphylaxis  . Shellfish Allergy Itching and Other (See Comments)    Took benadryl to alleviate reaction  . Miripirium Rash and Other (See Comments)    Change in mental status    Patient Measurements: Height: 5\' 8"  (172.7 cm) Weight: 139 lb 12.4 oz (63.4 kg) IBW/kg (Calculated) : 68.4  Vital Signs: Temp: 91.8 F (33.2 C) (07/28 2034) Temp Source: Rectal (07/28 2034) BP: 72/50 mmHg (07/28 2300) Pulse Rate: 64 (07/28 2300) Intake/Output from previous day: 07/27 0701 - 07/28 0700 In: 2325 [P.O.:120; I.V.:2055; IV Piggyback:150] Out: 2640 [Urine:1490; HWKGS:8110] Intake/Output from this shift: Total I/O In: 134.5 [I.V.:134.5] Out: 75 [Urine:75]  Labs:  Recent Labs  05/03/15 0440 05/04/15 0530 05/04/15 1440 05/05/15 0400 05/05/15 1600 05/05/15 1620 05/05/15 1658  WBC 48.6* 26.1*  --  24.7*  --   --   --   HGB 8.3* 7.4*  --  6.9*  --  4.1* 7.5*  PLT 672* 594*  --  565*  --   --   --   CREATININE 0.58*  --  0.59* 0.60* 0.58*  --   --    Estimated Creatinine Clearance: 121.1 mL/min (by C-G formula based on Cr of 0.58).  Recent Labs  05/04/15 1830 05/05/15 2120  VANCOTROUGH 33*  --   VANCORANDOM  --  16     Microbiology: Recent Results (from the past 720 hour(s))  Urine culture     Status: None   Collection Time: 04/25/15  4:35 PM  Result Value Ref Range Status   Specimen Description URINE, CLEAN CATCH  Final   Special Requests  Normal  Final   Culture   Final    >=100,000 COLONIES/mL ESCHERICHIA COLI AZTREONAM SENSITIVE ZONE 32MM Performed at [facility] Performed at [facility]    Report Status 05/02/2015 FINAL  Final   Organism ID, Bacteria ESCHERICHIA COLI  Final      Susceptibility   Escherichia coli - MIC*    AMPICILLIN <=2 SENSITIVE Sensitive     CEFAZOLIN <=4 SENSITIVE Sensitive     CEFTRIAXONE <=1 SENSITIVE Sensitive     CIPROFLOXACIN >=4 RESISTANT Resistant     GENTAMICIN <=1 SENSITIVE Sensitive     IMIPENEM <=0.25 SENSITIVE Sensitive     NITROFURANTOIN <=16 SENSITIVE Sensitive     TRIMETH/SULFA <=20 SENSITIVE Sensitive     AMPICILLIN/SULBACTAM <=2 SENSITIVE Sensitive     PIP/TAZO <=4 SENSITIVE Sensitive     * >=100,000 COLONIES/mL ESCHERICHIA COLI  Blood culture (routine x 2)     Status: None   Collection Time: 04/25/15  5:00 PM  Result Value Ref Range Status   Specimen Description BLOOD RIGHT ANTECUBITAL  Final   Special Requests BOTTLES DRAWN AEROBIC AND ANAEROBIC 5 CC EA  Final   Culture  Setup Time   Final    GRAM POSITIVE COCCI IN CLUSTERS Gram Stain Report Called to,Read Back By and Verified With: Elveria Rising RN 17:30 04/26/15 ([email or web address]) IN BOTH AEROBIC AND ANAEROBIC BOTTLES  Culture   Final    METHICILLIN RESISTANT STAPHYLOCOCCUS AUREUS MICROAEROPHILIC STREPTOCOCCI Standardized susceptibility testing for this organism is not available. Performed at [facility]    Report Status 04/30/2015 FINAL  Final   Organism ID, Bacteria METHICILLIN RESISTANT STAPHYLOCOCCUS AUREUS  Final      Susceptibility   Methicillin resistant staphylococcus aureus - MIC*    CIPROFLOXACIN >=8 RESISTANT Resistant     ERYTHROMYCIN >=8 RESISTANT Resistant     GENTAMICIN <=0.5 SENSITIVE Sensitive     OXACILLIN >=4 RESISTANT Resistant     TETRACYCLINE <=1 SENSITIVE Sensitive     VANCOMYCIN <=0.5 SENSITIVE Sensitive     TRIMETH/SULFA >=320 RESISTANT Resistant     CLINDAMYCIN >=8  RESISTANT Resistant     RIFAMPIN <=0.5 SENSITIVE Sensitive     Inducible Clindamycin NEGATIVE Sensitive     * METHICILLIN RESISTANT STAPHYLOCOCCUS AUREUS  Blood culture (routine x 2)     Status: None   Collection Time: 04/25/15  5:16 PM  Result Value Ref Range Status   Specimen Description BLOOD BLOOD RIGHT FOREARM  Final   Special Requests BOTTLES DRAWN AEROBIC AND ANAEROBIC 5 CC EA  Final   Culture  Setup Time   Final    GRAM POSITIVE COCCI IN CLUSTERS CRITICAL RESULT CALLED TO, READ BACK BY AND VERIFIED WITH: S GRAHAM 04/26/15 @ 30 M VESTAL IN BOTH AEROBIC AND ANAEROBIC BOTTLES    Culture   Final    METHICILLIN RESISTANT STAPHYLOCOCCUS AUREUS SUSCEPTIBILITIES PERFORMED ON PREVIOUS CULTURE WITHIN THE LAST 5 DAYS. MICROAEROPHILIC STREPTOCOCCI Standardized susceptibility testing for this organism is not available. Performed at [facility]    Report Status 05/03/2015 FINAL  Final  MRSA PCR Screening     Status: Abnormal   Collection Time: 04/25/15  6:55 PM  Result Value Ref Range Status   MRSA by PCR POSITIVE (A) NEGATIVE Final    Comment:        The GeneXpert MRSA Assay (FDA approved for NASAL specimens only), is one component of a comprehensive MRSA colonization surveillance program. It is not intended to diagnose MRSA infection nor to guide or monitor treatment for MRSA infections. RESULT CALLED TO, READ BACK BY AND VERIFIED WITH: Sharyn Lull REEVES,RN [id number] @ 2043 BY J SCOTTON   Culture, blood (routine x 2)     Status: None   Collection Time: 04/28/15  1:20 PM  Result Value Ref Range Status   Specimen Description BLOOD LEFT ARM  Final   Special Requests IN PEDIATRIC BOTTLE 1CC  Final   Culture  Setup Time   Final    GRAM POSITIVE COCCI IN CLUSTERS AEROBIC BOTTLE ONLY CRITICAL RESULT CALLED TO, READ BACK BY AND VERIFIED WITH: Hetty Blend RN 2222 04/30/15 A BROWNING    Culture   Final    MICROAEROPHILIC STREPTOCOCCI Standardized susceptibility testing for this  organism is not available. Performed at [facility]    Report Status 05/03/2015 FINAL  Final  Culture, blood (routine x 2)     Status: None   Collection Time: 04/28/15  5:50 PM  Result Value Ref Range Status   Specimen Description BLOOD PICC LINE  Final   Special Requests BOTTLES DRAWN AEROBIC AND ANAEROBIC [facility] EACH  Final   Culture   Final    NO GROWTH 5 DAYS Performed at [facility]    Report Status 05/03/2015 FINAL  Final  Culture, blood (routine x 2)     Status: None (Preliminary result)   Collection Time: 05/04/15  6:30 PM  Result Value Ref Range Status   Specimen Description BLOOD PICC LINE  Final   Special Requests BOTTLES DRAWN AEROBIC AND ANAEROBIC 5ML  Final   Culture   Final    NO GROWTH < 24 HOURS Performed at [facility]    Report Status PENDING  Incomplete  Culture, blood (routine x 2)     Status: None (Preliminary result)   Collection Time: 05/04/15  6:30 PM  Result Value Ref Range Status   Specimen Description BLOOD PICC LINE  Final   Special Requests BOTTLES DRAWN AEROBIC AND ANAEROBIC 5ML  Final   Culture   Final    NO GROWTH < 24 HOURS Performed at [facility]    Report Status PENDING  Incomplete    Assessment: 29 yo male on Vancomycin and Zosyn (Day #10) for MRSA bacteremia, HCAP, R internal obturator abscess, and Ecoli UTI. S/p I&D of abscess 7/28.  Anaphylaxis to PCNs/Cephs; rash to Ertapenem. ID following patient.  Scr remains stable and UOP 1.4 ml/kg/hr.  7/23 TTE could not exclude vegetation or mass in R atrium 7/28 TEE = no vegetations  7/18 >> Vancomycin >> 7/18 >> Aztreonam >>  7/18 blood: 2 of 2 MRSA, microaerophilic strep 8/20 urine: >100k E.coli (pansensitive except Cipro; sensitive to aztreonam)  7/18 MRSA PCR: positive 7/21 blood: 1/2 microaerophilic strep 6/01 blood: collected  7/27 1830 Vancomycin trough = 27 mcg/ml 7/28 2120 Vancomycin random = 16 mcg/ml Using the above 2 levels, calculated ke =  0.020 and calculated half-life = 34 hr  Goal of Therapy:  Vancomycin trough level 15-20 mcg/ml  Appropriate antibiotic dosing for renal function; eradication of infection  Plan:  - Change Vancomycin to 750mg  IV q48h. - Continue Aztreonam 1g q8h  - Will continue to f/u renal function, micro data, pt's clinical condition - Plan for Vanc trough at [facility] on new dose  Sherlon Handing, PharmD, BCPS Clinical pharmacist, pager [phone] 05/05/2015 11:47 PM

## 2015-05-05 NOTE — Progress Notes (Signed)
Talked to Shawn Rayburn PA for Dr. Migdalia Dk re reinforcing dressing at the sacral area. Will comeby to check patient.

## 2015-05-05 NOTE — Discharge Instructions (Signed)
Wound VC to sacral area with change every other day.   Wet to dry dressings on the ischial area.

## 2015-05-05 NOTE — CV Procedure (Signed)
See full TEE report in [employer]; normal LV function; no vegetations identified. Jason [name]  

## 2015-05-05 NOTE — Progress Notes (Signed)
Addendum  Patient transferred from [facility] to [facility] on 7/28 PM and underwent sacral wound debridement by Dr.Sanger. Postop patient became hypotensive and unstable. Pressors were started. Discussed with Dr. Migdalia Dk and agreed that patient should remain at [facility]. Consulted CCM for ICU admission. TRH will sign off. Please re consult when appropriate.  Vernell Leep, MD, FACP, FHM. [facility] Pager [phone]  If 7PM-7AM, please contact night-coverage [email or web address] Password [facility] 05/05/2015, 6:45 PM

## 2015-05-05 NOTE — Interval H&P Note (Signed)
History and Physical Interval Note:  05/05/2015 9:57 AM  [place]  has presented today for surgery, with the diagnosis of POSTIVE BLOOD CULTURE   The various methods of treatment have been discussed with the patient and family. After consideration of risks, benefits and other options for treatment, the patient has consented to  Procedure(s): TRANSESOPHAGEAL ECHOCARDIOGRAM (TEE) (N/A) as a surgical intervention .  The patient's history has been reviewed, patient examined, no change in status, stable for surgery.  I have reviewed the patient's chart and labs.  Questions were answered to the patient's satisfaction.     Kirk Ruths

## 2015-05-05 NOTE — Progress Notes (Signed)
Neo drip started at 50 mcg/kg/min.

## 2015-05-05 NOTE — Anesthesia Preprocedure Evaluation (Signed)
Anesthesia Evaluation  Patient identified by MRN, date of birth, ID band Patient awake    Reviewed: Allergy & Precautions, NPO status , Patient's Chart, lab work & pertinent test results  Airway Mallampati: II       Dental   Pulmonary asthma , former smoker,    + decreased breath sounds      Cardiovascular Normal cardiovascular exam+ dysrhythmias     Neuro/Psych Seizures -,   Neuromuscular disease CVA, Residual Symptoms    GI/Hepatic GERD-  ,  Endo/Other  diabetes, Type 1, Oral Hypoglycemic Agents  Renal/GU Renal InsufficiencyRenal disease     Musculoskeletal  (+) Arthritis -, Fibromyalgia -  Abdominal   Peds  Hematology  (+) anemia ,   Anesthesia Other Findings   Reproductive/Obstetrics                             Anesthesia Physical Anesthesia Plan  ASA: IV  Anesthesia Plan: MAC and General   Post-op Pain Management:    Induction: Intravenous  Airway Management Planned: Mask and Oral ETT  Additional Equipment:   Intra-op Plan:   Post-operative Plan: Extubation in OR  Informed Consent:   Plan Discussed with: CRNA, Anesthesiologist and Surgeon  Anesthesia Plan Comments:         Anesthesia Quick Evaluation

## 2015-05-05 NOTE — Progress Notes (Signed)
[facility] Progress Note Patient Name: JAMICAH ANSTEAD DOB: 04-21-1986 MRN: [id number]   Date of Service  05/05/2015  HPI/Events of Note  Hypotension requiring Phenylephrine IV infusion. Needs order for Phenylephrine IV infusion.   eICU Interventions  Order for Phenylephrine IV infusion.      Intervention Category Major Interventions: Hypotension - evaluation and management;Other:  Lysle Dingwall 05/05/2015, 6:38 PM

## 2015-05-05 NOTE — H&P (View-Only) (Signed)
INFECTIOUS DISEASE PROGRESS NOTE  ID: Jason [name] is a 29 y.o. male with  Active Problems:   Type 1 diabetes, uncontrolled, with neuropathy   Spinal cord infarction (history of)   Sepsis   Stage IV decubitus ulcer   Urinary tract infectious disease   HCAP (healthcare-associated pneumonia)   Hypotension   Abdominopelvic abscess  Subjective: Pt slept poorly- mom attributes to pain rx  Abtx:  Anti-infectives    Start     Dose/Rate Route Frequency Ordered Stop   04/28/15 2000  vancomycin (VANCOCIN) IVPB 750 mg/150 ml premix     750 mg 150 mL/hr over 60 Minutes Intravenous Every 24 hours 04/28/15 0731     04/26/15 0800  vancomycin (VANCOCIN) IVPB 750 mg/150 ml premix  Status:  Discontinued     750 mg 150 mL/hr over 60 Minutes Intravenous Every 12 hours 04/25/15 1715 04/28/15 0731   04/26/15 0200  aztreonam (AZACTAM) 1 g in dextrose 5 % 50 mL IVPB     1 g 100 mL/hr over 30 Minutes Intravenous Every 8 hours 04/25/15 1919     04/25/15 1715  vancomycin (VANCOCIN) IVPB 750 mg/150 ml premix     750 mg 150 mL/hr over 60 Minutes Intravenous STAT 04/25/15 1715 04/25/15 1906   04/25/15 1645  aztreonam (AZACTAM) 2 g in dextrose 5 % 50 mL IVPB     2 g 100 mL/hr over 30 Minutes Intravenous  Once 04/25/15 1643 04/25/15 1807      Medications:  Scheduled: . apixaban  5 mg Oral BID  . aztreonam  1 g Intravenous Q8H  . baclofen  10 mg Oral BID WC  . baclofen  20 mg Oral QHS  . feeding supplement (ENSURE ENLIVE)  237 mL Oral BID BM  . feeding supplement (GLUCERNA SHAKE)  237 mL Oral TID BM  . feeding supplement (PRO-STAT SUGAR FREE 64)  30 mL Oral TID BM  . fentaNYL  50 mcg Transdermal Q72H  . insulin aspart  0-15 Units Subcutaneous TID WC  . insulin glargine  15 Units Subcutaneous Daily  . loratadine  10 mg Oral QHS  . midodrine  20 mg Oral TID WC  . mirtazapine  7.5 mg Oral QHS  . pantoprazole  40 mg Oral Daily  . pregabalin  100 mg Oral TID AC  . pregabalin  200 mg Oral QHS   . sodium chloride  10-40 mL Intracatheter Q12H  . vancomycin  750 mg Intravenous Q24H    Objective: Vital signs in last 24 hours: Temp:  [96.2 F (35.7 C)-98 F (36.7 C)] 96.2 F (35.7 C) (07/27 1200) Resp:  [9-22] 19 (07/27 1400) BP: (84-143)/(50-92) 90/75 mmHg (07/27 1400) SpO2:  [95 %-100 %] 100 % (07/27 1400) Arterial Line BP: (84-121)/(37-66) 121/66 mmHg (07/26 2000)   General appearance: alert, cooperative and no distress Resp: clear to auscultation bilaterally Cardio: regular rate and rhythm GI: normal findings: bowel sounds normal and soft, non-tender Extremities: LLE wrapped.   Lab Results  Recent Labs  05/02/15 0536 05/03/15 0440 05/04/15 0530  WBC 31.3* 48.6* 26.1*  HGB 8.5* 8.3* 7.4*  HCT 26.4* 26.3* 23.6*  NA 134* 133*  --   K 4.3 3.9  --   CL 120* 116*  --   CO2 13* 14*  --   BUN 14 16  --   CREATININE 0.56* 0.58*  --    Liver Panel No results for input(s): PROT, ALBUMIN, AST, ALT, ALKPHOS, BILITOT, BILIDIR, IBILI in the last  72 hours. Sedimentation Rate No results for input(s): ESRSEDRATE in the last 72 hours. C-Reactive Protein No results for input(s): CRP in the last 72 hours.  Microbiology: Recent Results (from the past 240 hour(s))  Urine culture     Status: None   Collection Time: 04/25/15  4:35 PM  Result Value Ref Range Status   Specimen Description URINE, CLEAN CATCH  Final   Special Requests Normal  Final   Culture   Final    >=100,000 COLONIES/mL ESCHERICHIA COLI AZTREONAM SENSITIVE ZONE 32MM Performed at [facility] Performed at [facility]    Report Status 05/02/2015 FINAL  Final   Organism ID, Bacteria ESCHERICHIA COLI  Final      Susceptibility   Escherichia coli - MIC*    AMPICILLIN <=2 SENSITIVE Sensitive     CEFAZOLIN <=4 SENSITIVE Sensitive     CEFTRIAXONE <=1 SENSITIVE Sensitive     CIPROFLOXACIN >=4 RESISTANT Resistant     GENTAMICIN <=1 SENSITIVE Sensitive     IMIPENEM <=0.25 SENSITIVE Sensitive      NITROFURANTOIN <=16 SENSITIVE Sensitive     TRIMETH/SULFA <=20 SENSITIVE Sensitive     AMPICILLIN/SULBACTAM <=2 SENSITIVE Sensitive     PIP/TAZO <=4 SENSITIVE Sensitive     * >=100,000 COLONIES/mL ESCHERICHIA COLI  Blood culture (routine x 2)     Status: None   Collection Time: 04/25/15  5:00 PM  Result Value Ref Range Status   Specimen Description BLOOD RIGHT ANTECUBITAL  Final   Special Requests BOTTLES DRAWN AEROBIC AND ANAEROBIC 5 CC EA  Final   Culture  Setup Time   Final    GRAM POSITIVE COCCI IN CLUSTERS Gram Stain Report Called to,Read Back By and Verified With: Elveria Rising RN 17:30 04/26/15 ([email or web address]) IN BOTH AEROBIC AND ANAEROBIC BOTTLES    Culture   Final    METHICILLIN RESISTANT STAPHYLOCOCCUS AUREUS MICROAEROPHILIC STREPTOCOCCI Standardized susceptibility testing for this organism is not available. Performed at [facility]    Report Status 04/30/2015 FINAL  Final   Organism ID, Bacteria METHICILLIN RESISTANT STAPHYLOCOCCUS AUREUS  Final      Susceptibility   Methicillin resistant staphylococcus aureus - MIC*    CIPROFLOXACIN >=8 RESISTANT Resistant     ERYTHROMYCIN >=8 RESISTANT Resistant     GENTAMICIN <=0.5 SENSITIVE Sensitive     OXACILLIN >=4 RESISTANT Resistant     TETRACYCLINE <=1 SENSITIVE Sensitive     VANCOMYCIN <=0.5 SENSITIVE Sensitive     TRIMETH/SULFA >=320 RESISTANT Resistant     CLINDAMYCIN >=8 RESISTANT Resistant     RIFAMPIN <=0.5 SENSITIVE Sensitive     Inducible Clindamycin NEGATIVE Sensitive     * METHICILLIN RESISTANT STAPHYLOCOCCUS AUREUS  Blood culture (routine x 2)     Status: None   Collection Time: 04/25/15  5:16 PM  Result Value Ref Range Status   Specimen Description BLOOD BLOOD RIGHT FOREARM  Final   Special Requests BOTTLES DRAWN AEROBIC AND ANAEROBIC 5 CC EA  Final   Culture  Setup Time   Final    GRAM POSITIVE COCCI IN CLUSTERS CRITICAL RESULT CALLED TO, READ BACK BY AND VERIFIED WITH: S GRAHAM 04/26/15 @ 29 M VESTAL IN  BOTH AEROBIC AND ANAEROBIC BOTTLES    Culture   Final    METHICILLIN RESISTANT STAPHYLOCOCCUS AUREUS SUSCEPTIBILITIES PERFORMED ON PREVIOUS CULTURE WITHIN THE LAST 5 DAYS. MICROAEROPHILIC STREPTOCOCCI Standardized susceptibility testing for this organism is not available. Performed at [facility]    Report Status 05/03/2015 FINAL  Final  MRSA PCR Screening     Status: Abnormal   Collection Time: 04/25/15  6:55 PM  Result Value Ref Range Status   MRSA by PCR POSITIVE (A) NEGATIVE Final    Comment:        The GeneXpert MRSA Assay (FDA approved for NASAL specimens only), is one component of a comprehensive MRSA colonization surveillance program. It is not intended to diagnose MRSA infection nor to guide or monitor treatment for MRSA infections. RESULT CALLED TO, READ BACK BY AND VERIFIED WITH: Sharyn Lull REEVES,RN [id number] @ 2043 BY J SCOTTON   Culture, blood (routine x 2)     Status: None   Collection Time: 04/28/15  1:20 PM  Result Value Ref Range Status   Specimen Description BLOOD LEFT ARM  Final   Special Requests IN PEDIATRIC BOTTLE 1CC  Final   Culture  Setup Time   Final    GRAM POSITIVE COCCI IN CLUSTERS AEROBIC BOTTLE ONLY CRITICAL RESULT CALLED TO, READ BACK BY AND VERIFIED WITH: Hetty Blend RN 2222 04/30/15 A BROWNING    Culture   Final    MICROAEROPHILIC STREPTOCOCCI Standardized susceptibility testing for this organism is not available. Performed at [facility]    Report Status 05/03/2015 FINAL  Final  Culture, blood (routine x 2)     Status: None   Collection Time: 04/28/15  5:50 PM  Result Value Ref Range Status   Specimen Description BLOOD PICC LINE  Final   Special Requests BOTTLES DRAWN AEROBIC AND ANAEROBIC 5CC EACH  Final   Culture   Final    NO GROWTH 5 DAYS Performed at [facility]    Report Status 05/03/2015 FINAL  Final    Studies/Results: No results found.   Assessment/Plan: MRSA bacteremia His repeat BCx 7-21 is  micro-aerophilic strep Will repeat for clearance  Await TEE in AM  Drug monitoring His vanco level is pending His Cr is stable  Decubitus ulcers R internal obturator abscess appreciate wound care f/u as well as Dr Migdalia Dk.  He is to have debridement in AM Will plan to continue zosyn once UTI course is complete to also treat his abscess  L heel wound- no abscess or osteo Will continue his vanco/zosyn Debridement per surgery  UTI- E coli Will continue his vanco/zosyn  ? HCAP Will continue his vanco/zosyn Will plan to continue zosyn once UTI course is complete to also treat his abscess  Thrombocytosis, leukocytosis Due to "sepsis" and to steroids.  Improved today today. Continue to follow  Quadraplegia  Insomnia His pain regiemen has been changed. Hopefully he will have better slepp.   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 Continue midodrine.   Total days of antibiotics: 9  7/18 vanco/aztreonam         [name] Infectious Diseases (pager) [phone] [email or web address] 05/04/2015, 2:45 PM  LOS: 9 days

## 2015-05-05 NOTE — Op Note (Addendum)
Operative Note   DATE OF OPERATION: 05/05/2015  LOCATION:[place] [facility]   SURGICAL DIVISION: Plastic Surgery  PREOPERATIVE DIAGNOSES:  Sacral and ischial ulcers  POSTOPERATIVE DIAGNOSES:  same  PROCEDURE:  Hemostasis of sacral and ischial ulcers  SURGEON: Theodoro Kos, DO  ASSISTANT: Shawn Rayburn, PA  ANESTHESIA:  General.   COMPLICATIONS: None.   INDICATIONS FOR PROCEDURE:  The patient, Jason [name] is a 29 y.o. male born on January 24, 1986, is here for treatment of pressure ulcers. He was taken to the OR for debridement and drainage of a pelvic abscess that connected to the right ischial ulcer.  This was completed without complications.  While in the PACU the patient became hypotensive and was bleeding from all areas despite having completed the case with hemostasis achieved. MRN: [id number]   DESCRIPTION OF PROCEDURE:  The dressings and VAC were removed and he was bleeding from skin, soft tissue, muscle and bone in all wounds.  The bovie was used to obtain hemostasis which took over an hour due to continual repeat bleeding.Surgicel was used to aid in the process and the kerlex was then used to pack the wounds.  The VAC was not replaced. There was good hemostasis and the patient was turned to the care of internal medicine and the PACU.  During this period the patient received blood, albumin and neo for stabilization.  I spoke with his mother and informed her of what happened.

## 2015-05-05 NOTE — Brief Op Note (Addendum)
04/25/2015 - 05/05/2015  12:28 PM  PATIENT:  Jason [name]  29 y.o. male  PRE-OPERATIVE DIAGNOSIS:  SACRAL WOUND  POST-OPERATIVE DIAGNOSIS:  same  PROCEDURE:  Procedure(s): IRRIGATION AND DEBRIDEMENT SACRAL ULCER (N/A)  SURGEON:  Surgeon(s) and Role:    * Brondon Wann Sanger, DO - Primary  PHYSICIAN ASSISTANT: Shawn Rayburn, PA  ASSISTANTS: none   ANESTHESIA:   general  EBL:  Total I/O In: 600 [I.V.:270; Blood:330] Out: 300 [Urine:300]  BLOOD ADMINISTERED:none  DRAINS: none   LOCAL MEDICATIONS USED:  NONE  SPECIMEN:  Source of Specimen:  sacral ulcer  DISPOSITION OF SPECIMEN:  micro  COUNTS:  YES  TOURNIQUET:  * No tourniquets in log *  DICTATION: .Dragon Dictation  PLAN OF CARE: Admit to inpatient   PATIENT DISPOSITION:  PACU - hemodynamically stable.   Delay start of Pharmacological VTE agent (>24hrs) due to surgical blood loss or risk of bleeding: no

## 2015-05-05 NOTE — Progress Notes (Signed)
Report called to [facility] and [facility]. Vital signs currently stable. 1 unit of blood running into right PICC. Mother made aware of plans to transfer pt to [facility] for TEE and surgery and then back to [facility], understands and agrees.
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
## 2015-05-05 NOTE — Transfer of Care (Signed)
Immediate Anesthesia Transfer of Care Note  Patient: Jason [name]  Procedure(s) Performed: Procedure(s): IRRIGATION AND DEBRIDEMENT SACRAL ULCER (Bilateral)  Patient Location: PACU  Anesthesia Type:General  Level of Consciousness: awake and alert   Airway & Oxygen Therapy: Patient Spontanous Breathing and Patient connected to nasal cannula oxygen  Post-op Assessment: Report given to RN and Post -op Vital signs reviewed and stable  Post vital signs: Reviewed and stable  Last Vitals:  Filed Vitals:   05/05/15 1402  BP:   Pulse:   Temp: 36 C  Resp:     Complications: No apparent anesthesia complications

## 2015-05-05 NOTE — Progress Notes (Signed)
Dr Germeroth at bedside  

## 2015-05-05 NOTE — Progress Notes (Signed)
[facility] Progress Note Patient Name: Jason [name] DOB: March 21, 1986 MRN: [id number]   Date of Service  05/05/2015  HPI/Events of Note  Patient had significant bleeding from sacral decubitus debridement today. Patient on Elaquis.  eICU Interventions  Hold Elaquis tonight.      Intervention Category Major Interventions: Other:  Lysle Dingwall 05/05/2015, 10:11 PM

## 2015-05-05 NOTE — Progress Notes (Addendum)
Dr. Migdalia Dk and Shawn PA at bedside since 1512, aware about dressing reinforcement. When dressing was removed noted more bleeding at site.

## 2015-05-05 NOTE — Progress Notes (Signed)
CRITICAL VALUE ALERT  Critical value received: Hgb 6.9  Date of notification:  05/05/2015  Time of notification:  0500  Critical value read back: yes   Nurse who received alert: Manson Allan RN  MD notified (1st page): Lamar Blinks NP  Time of first page:  [phone]  MD notified (2nd page):N/A  Time of second page:N/A  Responding MD:  Lamar Blinks NP  Time MD responded:  [phone]

## 2015-05-05 NOTE — Anesthesia Procedure Notes (Signed)
Procedure Name: Intubation Date/Time: 05/05/2015 12:38 PM Performed by: Eligha Bridegroom Pre-anesthesia Checklist: Patient identified, Emergency Drugs available, Suction available, Patient being monitored and Timeout performed Patient Re-evaluated:Patient Re-evaluated prior to inductionOxygen Delivery Method: Circle system utilized Preoxygenation: Pre-oxygenation with 100% oxygen Intubation Type: IV induction Ventilation: Mask ventilation without difficulty Laryngoscope Size: Mac and 4 Grade View: Grade I Tube type: Oral Tube size: 7.5 mm Placement Confirmation: ETT inserted through vocal cords under direct vision,  positive ETCO2 and breath sounds checked- equal and bilateral Secured at: 22 cm Tube secured with: Tape Dental Injury: Teeth and Oropharynx as per pre-operative assessment

## 2015-05-05 NOTE — Progress Notes (Signed)
   error          [name] Infectious Diseases (pager) [phone] [email or web address] 05/05/2015, 2:03 PM  LOS: 10 days

## 2015-05-05 NOTE — Progress Notes (Signed)
Date:  May 05, 2015 U.R. performed for needs and level of care. hgb 6.9-for tee and incison and drainage of intra abd abcess [facility] today. Will continue to follow for Case Management needs.  Velva Harman, RN, BSN, [place]   [phone]

## 2015-05-05 NOTE — Progress Notes (Signed)
Echocardiogram Echocardiogram Transesophageal has been performed.  Jason [name] 05/05/2015, 11:03 AM

## 2015-05-05 NOTE — Progress Notes (Signed)
[id number] transferred to [facility]/Rhonda [place], BSN, CCM.

## 2015-05-06 ENCOUNTER — Encounter (HOSPITAL_COMMUNITY): Payer: Self-pay | Admitting: Cardiology

## 2015-05-06 DIAGNOSIS — M8619 Other acute osteomyelitis, multiple sites: Secondary | ICD-10-CM

## 2015-05-06 DIAGNOSIS — R609 Edema, unspecified: Secondary | ICD-10-CM

## 2015-05-06 DIAGNOSIS — Z959 Presence of cardiac and vascular implant and graft, unspecified: Secondary | ICD-10-CM

## 2015-05-06 DIAGNOSIS — B955 Unspecified streptococcus as the cause of diseases classified elsewhere: Secondary | ICD-10-CM

## 2015-05-06 DIAGNOSIS — B9562 Methicillin resistant Staphylococcus aureus infection as the cause of diseases classified elsewhere: Secondary | ICD-10-CM

## 2015-05-06 LAB — CBC WITH DIFFERENTIAL/PLATELET
BASOS ABS: 0 10*3/uL (ref 0.0–0.1)
BASOS PCT: 0 % (ref 0–1)
BASOS PCT: 0 % (ref 0–1)
Basophils Absolute: 0 10*3/uL (ref 0.0–0.1)
EOS ABS: 0 10*3/uL (ref 0.0–0.7)
EOS ABS: 0.3 10*3/uL (ref 0.0–0.7)
Eosinophils Relative: 0 % (ref 0–5)
Eosinophils Relative: 1 % (ref 0–5)
HCT: 31.1 % — ABNORMAL LOW (ref 39.0–52.0)
HCT: 25.6 % — ABNORMAL LOW (ref 39.0–52.0)
Hemoglobin: 11.1 g/dL — ABNORMAL LOW (ref 13.0–17.0)
Hemoglobin: 9 g/dL — ABNORMAL LOW (ref 13.0–17.0)
LYMPHS ABS: 3 10*3/uL (ref 0.7–4.0)
Lymphocytes Relative: 6 % — ABNORMAL LOW (ref 12–46)
Lymphocytes Relative: 11 % — ABNORMAL LOW (ref 12–46)
Lymphs Abs: 4.5 10*3/uL — ABNORMAL HIGH (ref 0.7–4.0)
MCH: 28.8 pg (ref 26.0–34.0)
MCH: 28.3 pg (ref 26.0–34.0)
MCHC: 35.7 g/dL (ref 30.0–36.0)
MCHC: 35.2 g/dL (ref 30.0–36.0)
MCV: 80.8 fL (ref 78.0–100.0)
MCV: 80.5 fL (ref 78.0–100.0)
MONOS PCT: 6 % (ref 3–12)
Monocytes Absolute: 3 10*3/uL — ABNORMAL HIGH (ref 0.1–1.0)
Monocytes Absolute: 3.3 10*3/uL — ABNORMAL HIGH (ref 0.1–1.0)
Monocytes Relative: 8 % (ref 3–12)
NEUTROS ABS: 33.1 10*3/uL — AB (ref 1.7–7.7)
NEUTROS PCT: 80 % — AB (ref 43–77)
Neutro Abs: 44 10*3/uL — ABNORMAL HIGH (ref 1.7–7.7)
Neutrophils Relative %: 88 % — ABNORMAL HIGH (ref 43–77)
PLATELETS: 317 10*3/uL (ref 150–400)
PLATELETS: 305 10*3/uL (ref 150–400)
RBC: 3.85 MIL/uL — ABNORMAL LOW (ref 4.22–5.81)
RBC: 3.18 MIL/uL — AB (ref 4.22–5.81)
RDW: 15.6 % — AB (ref 11.5–15.5)
RDW: 15.9 % — ABNORMAL HIGH (ref 11.5–15.5)
WBC: 50 10*3/uL — AB (ref 4.0–10.5)
WBC: 41.1 10*3/uL — ABNORMAL HIGH (ref 4.0–10.5)

## 2015-05-06 LAB — BASIC METABOLIC PANEL
ANION GAP: 3 — AB (ref 5–15)
BUN: 15 mg/dL (ref 6–20)
CO2: 27 mmol/L (ref 22–32)
Calcium: 6.6 mg/dL — ABNORMAL LOW (ref 8.9–10.3)
Chloride: 102 mmol/L (ref 101–111)
Creatinine, Ser: 0.57 mg/dL — ABNORMAL LOW (ref 0.61–1.24)
GFR calc non Af Amer: >60 mL/min (ref >60)
Glucose, Bld: 424 mg/dL — ABNORMAL HIGH (ref 65–99)
Potassium: 3.6 mmol/L (ref 3.5–5.1)
Sodium: 132 mmol/L — ABNORMAL LOW (ref 135–145)

## 2015-05-06 LAB — GLUCOSE, CAPILLARY
Glucose-Capillary: 99 mg/dL (ref 65–99)
Glucose-Capillary: 105 mg/dL — ABNORMAL HIGH (ref 65–99)
Glucose-Capillary: 52 mg/dL — ABNORMAL LOW (ref 65–99)
Glucose-Capillary: 55 mg/dL — ABNORMAL LOW (ref 65–99)

## 2015-05-06 LAB — MAGNESIUM: Magnesium: 1.5 mg/dL — ABNORMAL LOW (ref 1.7–2.4)

## 2015-05-06 LAB — PROTIME-INR
INR: 1.8 — AB (ref 0.00–1.49)
Prothrombin Time: 20.8 seconds — ABNORMAL HIGH (ref 11.6–15.2)

## 2015-05-06 LAB — FIBRINOGEN: Fibrinogen: 233 mg/dL (ref 204–475)

## 2015-05-06 LAB — PHOSPHORUS: Phosphorus: 3.1 mg/dL (ref 2.5–4.6)

## 2015-05-06 MED ORDER — PREGABALIN 50 MG PO CAPS: 100.0000 mg | ORAL_CAPSULE | Freq: Three times a day (TID) | ORAL | Status: DC

## 2015-05-06 MED ORDER — POTASSIUM CHLORIDE CRYS ER 20 MEQ PO TBCR
40.0000 meq | EXTENDED_RELEASE_TABLET | Freq: Once | ORAL | Status: AC
  Administered 2015-05-06: 40 meq via ORAL
  Filled 2015-05-06: qty 2

## 2015-05-06 MED ORDER — DEXTROSE 50 % IV SOLN
1.0000 | Freq: Once | INTRAVENOUS | Status: AC
  Administered 2015-05-06: 50 mL via INTRAVENOUS
  Filled 2015-05-06: qty 50

## 2015-05-06 MED ORDER — PHENYLEPHRINE HCL 10 MG/ML IJ SOLN
0.0000 ug/min | INTRAVENOUS | Status: DC
  Administered 2015-05-06: 30 ug/min via INTRAVENOUS
  Administered 2015-05-07: 65 ug/min via INTRAVENOUS
  Administered 2015-05-08: 12 ug/min via INTRAVENOUS
  Administered 2015-05-08: 100 ug/min via INTRAVENOUS
  Administered 2015-05-09: 25 ug/min via INTRAVENOUS
  Filled 2015-05-06 (×6): qty 4

## 2015-05-06 MED ORDER — MAGNESIUM SULFATE 2 GM/50ML IV SOLN
2.0000 g | Freq: Once | INTRAVENOUS | Status: AC
  Administered 2015-05-06: 2 g via INTRAVENOUS
  Filled 2015-05-06: qty 50

## 2015-05-06 NOTE — Progress Notes (Signed)
eLink Physician-Brief Progress Note Patient Name: TONIE VIZCARRONDO DOB: 06/05/1986 MRN: [id number]   Date of Service  05/06/2015  HPI/Events of Note  Hypokalemia and hypomag  eICU Interventions  Potassium and mag replaced     Intervention Category Intermediate Interventions: Electrolyte abnormality - evaluation and management  Numa Heatwole 05/06/2015, 6:13 AM

## 2015-05-06 NOTE — Consult Note (Signed)
Pt is now followed for assessment and plan of care of wounds by plastic surgery team, who took him to the OR yesterday.  Please refer to their team for further questions. Please re-consult if further assistance is needed.  Thank-you,  Julien Girt MSN, [place], [place], [place], [place]

## 2015-05-06 NOTE — Progress Notes (Signed)
1 Day Post-Op  Subjective: Patient alert and reports being very hungry.  Sacral and ischial wounds with minor oozing, mostly serosanguinous overnight and this am.  No plans to return to OR at this point.  Wounds packed with kerlix and will plan for local wound care for now.   Objective: Vital signs in last 24 hours: Temp:  [91.8 F (33.2 C)-98.3 F (36.8 C)] 98.3 F (36.8 C) (07/29 0618) Pulse Rate:  [57-77] 76 (07/29 0600) Resp:  [8-22] 22 (07/29 0600) BP: (71-156)/(44-144) 88/56 mmHg (07/29 0600) SpO2:  [94 %-100 %] 99 % (07/29 0600) Arterial Line BP: (91-113)/(51-63) 91/51 mmHg (07/28 1737) Weight:  [72.5 kg (159 lb 13.3 oz)] 72.5 kg (159 lb 13.3 oz) (07/29 0600) Last BM Date: 05/05/15  Intake/Output from previous day: 07/28 0701 - 07/29 0700 In: 2776 [I.V.:2296; Blood:330; IV Piggyback:150] Out: 1628 [Urine:1628] Intake/Output this shift:    General appearance: alert, cooperative, mild distress and quadriplegia male Sacral and ischial wounds with serosanguinous oozing. No active bleeding noted this am.   Lab Results:   Recent Labs  05/05/15 0400  05/05/15 1658 05/06/15 0430  WBC 24.7*  --   --  50.0*  HGB 6.9*  < > 7.5* 11.1*  HCT 21.6*  < > 22.0* 31.1*  PLT 565*  --   --  317  < > = values in this interval not displayed. BMET  Recent Labs  05/05/15 1600 05/05/15 1620 05/05/15 1658 05/06/15 0430  NA 135 138 140 132*  K 3.4* 3.7 3.7 3.6  CL 112*  --   --  102  CO2 21*  --   --  27  GLUCOSE 130* 130*  --  424*  BUN 14  --   --  15  CREATININE 0.58*  --   --  0.57*  CALCIUM 7.0*  --   --  6.6*   PT/INR No results for input(s): LABPROT, INR in the last 72 hours. ABG  Recent Labs  05/05/15 1658  PHART 7.251*  HCO3 16.1*    Studies/Results: No results found.  Anti-infectives: Anti-infectives    Start     Dose/Rate Route Frequency Ordered Stop   05/06/15 0000  vancomycin (VANCOCIN) IVPB 750 mg/150 ml premix     750 mg 150 mL/hr over 60  Minutes Intravenous Every 48 hours 05/05/15 2357     04/28/15 2000  vancomycin (VANCOCIN) IVPB 750 mg/150 ml premix  Status:  Discontinued     750 mg 150 mL/hr over 60 Minutes Intravenous Every 24 hours 04/28/15 0731 05/04/15 1932   04/26/15 0800  vancomycin (VANCOCIN) IVPB 750 mg/150 ml premix  Status:  Discontinued     750 mg 150 mL/hr over 60 Minutes Intravenous Every 12 hours 04/25/15 1715 04/28/15 0731   04/26/15 0200  aztreonam (AZACTAM) 1 g in dextrose 5 % 50 mL IVPB     1 g 100 mL/hr over 30 Minutes Intravenous Every 8 hours 04/25/15 1919     04/25/15 1715  vancomycin (VANCOCIN) IVPB 750 mg/150 ml premix     750 mg 150 mL/hr over 60 Minutes Intravenous STAT 04/25/15 1715 04/25/15 1906   04/25/15 1645  aztreonam (AZACTAM) 2 g in dextrose 5 % 50 mL IVPB     2 g 100 mL/hr over 30 Minutes Intravenous  Once 04/25/15 1643 04/25/15 1807      Assessment/Plan: s/p Procedure(s): IRRIGATION AND DEBRIDEMENT SACRAL ULCER (Bilateral) Will advance diet as no plans to return to OR at this point.  Will plan for local wound care Continues Vancomycin/aztreonam for chronic pelvic osteomyelitis Cultures pending from drainage from right pelvic abscess cavity Will need to monitor closely for recurrent bleeding if resuming Eliquis today Other management per CCM/IM  LOS: 11 days    Lilli Dewald,PA-C Plastic Surgery [phone]

## 2015-05-06 NOTE — Progress Notes (Signed)
[place] Progress Note Patient Name: Jason [name] DOB: [birth year] MRN: [id number]   Date of Service  05/06/2015  HPI/Events of Note  Bleeding with dressing changes.   eICU Interventions  Will order: 1. CBC now. 2. Nurse to consult with surgery about wound bleeding.      Intervention Category Intermediate Interventions: Bleeding - evaluation and treatment with blood products  [name],Steven Cornelia Copa 05/06/2015, 6:31 PM

## 2015-05-06 NOTE — Progress Notes (Addendum)
Nutrition Follow Up  DOCUMENTATION CODES:   Severe malnutrition in context of acute illness/injury, Underweight  INTERVENTION:    D/C Prostat liquid protein   Continue Glucerna Shake po TID, each supplement provides 220 kcal and 10 grams of protein  NUTRITION DIAGNOSIS:   Increased nutrient needs related to wound healing as evidenced by estimated needs, ongoing  GOAL:   Patient will meet greater than or equal to 90% of their needs, progressing  MONITOR:   PO intake, Supplement acceptance, Weight trends, Labs  ASSESSMENT:  29 year old male who has a past medical history of GERD (gastroesophageal reflux disease); Asthma; MRSA infection; Gastroparesis; Diabetic neuropathy; Seizures; Family history of anesthesia complication; Dysrhythmia; Pneumonia; Arthritis; Fibromyalgia; Stroke, Type I diabetes mellitus; and Syncope.  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 Patient was sent to the emergency room from the physical therapy office today after patient was found to be hypotensive. [name] says that he has not been eating and drinking well for past twoweeks. In the ED patient was found to be dehydrated, had white count of 20,000, chest x-ray showing pneumonia as well as abnormal UA consistent with UTI.  Patient s/p procedure 7/28: IRRIGATION AND DEBRIDEMENT SACRAL ULCER - PLACEMENT OF ACELL AND VAC  Pt sleeping soundly upon RD visit.  PO intake 100% this AM.  Malnutrition ongoing.  Spoke with RN.  Pt refusing Prostat as it's giving him diarrhea.  He is however drinking some of his Glucerna Shakes.  Diet Order:  Diet Carb Modified Fluid consistency:: Thin; Room service appropriate?: Yes  Skin:  Wound (see comment) (Stage 4 sacral pressure ulcer, Stage 3 L heel pressure ulcer, Incision to back)  Last BM:  7/21  Height:   Ht Readings from Last 1 Encounters:  04/25/15 5\' 8"  (1.727 m)    Weight:   Wt Readings from Last 1 Encounters:   05/06/15 159 lb 13.3 oz (72.5 kg)    Ideal Body Weight:  70 kg (kg)  Wt Readings from Last 10 Encounters:  05/06/15 159 lb 13.3 oz (72.5 kg)  04/01/15 130 lb (58.968 kg)  02/20/15 111 lb 15.9 oz (50.8 kg)  02/16/15 112 lb (50.803 kg)  01/12/15 128 lb 4.9 oz (58.2 kg)  12/19/14 130 lb 1.1 oz (59 kg)  11/25/14 155 lb (70.308 kg)  11/19/14 155 lb 13.8 oz (70.7 kg)  10/02/14 125 lb 10.6 oz (57 kg)  09/23/14 131 lb 1.6 oz (59.467 kg)    BMI:  Body mass index is 24.31 kg/(m^2).  Estimated Nutritional Needs:   Kcal:  8032-1224  Protein:  85-100 grams  Fluid:  2.5 L/day  EDUCATION NEEDS:   No education needs identified at this time  Arthur Holms, RD, LDN Pager #: [phone] After-Hours Pager #: [phone]

## 2015-05-06 NOTE — Progress Notes (Signed)
[name] for Infectious Disease  Date of Admission:  04/25/2015  Antibiotics: Vancomycin and aztreonam  Subjective: No complaints  Objective: Temp:  [91.8 F (33.2 C)-98.3 F (36.8 C)] 97.5 F (36.4 C) (07/29 0845) Pulse Rate:  [57-78] 77 (07/29 0900) Resp:  [8-29] 19 (07/29 0900) BP: (68-156)/(40-144) 94/54 mmHg (07/29 0800) SpO2:  [92 %-100 %] 100 % (07/29 0900) Arterial Line BP: (91-148)/(44-93) 91/51 mmHg (07/28 1737) Weight:  [159 lb 13.3 oz (72.5 kg)] 159 lb 13.3 oz (72.5 kg) (07/29 0600)  General: sleeping, arousable Skin: no rashes Lungs: CTA, anterior exam Cor: RRR without m Abdomen: soft, nt, nd Ext; no edema; right arm picc without erythema  Lab Results Lab Results  Component Value Date   WBC 50.0* 05/06/2015   HGB 11.1* 05/06/2015   HCT 31.1* 05/06/2015   MCV 80.8 05/06/2015   PLT 317 05/06/2015    Lab Results  Component Value Date   CREATININE 0.57* 05/06/2015   BUN 15 05/06/2015   NA 132* 05/06/2015   K 3.6 05/06/2015   CL 102 05/06/2015   CO2 27 05/06/2015    Lab Results  Component Value Date   ALT 7* 04/28/2015   AST 8* 04/28/2015   ALKPHOS 173* 04/28/2015   BILITOT 0.4 04/28/2015      Microbiology: Recent Results (from the past 240 hour(s))  Culture, blood (routine x 2)     Status: None   Collection Time: 04/28/15  1:20 PM  Result Value Ref Range Status   Specimen Description BLOOD LEFT ARM  Final   Special Requests IN PEDIATRIC BOTTLE 1CC  Final   Culture  Setup Time   Final    GRAM POSITIVE COCCI IN CLUSTERS AEROBIC BOTTLE ONLY CRITICAL RESULT CALLED TO, READ BACK BY AND VERIFIED WITH: Hetty Blend RN 2222 04/30/15 A BROWNING    Culture   Final    MICROAEROPHILIC STREPTOCOCCI Standardized susceptibility testing for this organism is not available. Performed at [facility]    Report Status 05/03/2015 FINAL  Final  Culture, blood (routine x 2)     Status: None   Collection Time: 04/28/15  5:50 PM  Result Value  Ref Range Status   Specimen Description BLOOD PICC LINE  Final   Special Requests BOTTLES DRAWN AEROBIC AND ANAEROBIC 5CC EACH  Final   Culture   Final    NO GROWTH 5 DAYS Performed at [facility]    Report Status 05/03/2015 FINAL  Final  Culture, blood (routine x 2)     Status: None (Preliminary result)   Collection Time: 05/04/15  6:30 PM  Result Value Ref Range Status   Specimen Description BLOOD PICC LINE  Final   Special Requests BOTTLES DRAWN AEROBIC AND ANAEROBIC 5ML  Final   Culture   Final    NO GROWTH < 24 HOURS Performed at [facility]    Report Status PENDING  Incomplete  Culture, blood (routine x 2)     Status: None (Preliminary result)   Collection Time: 05/04/15  6:30 PM  Result Value Ref Range Status   Specimen Description BLOOD PICC LINE  Final   Special Requests BOTTLES DRAWN AEROBIC AND ANAEROBIC 5ML  Final   Culture   Final    NO GROWTH < 24 HOURS Performed at [facility]    Report Status PENDING  Incomplete  Anaerobic culture     Status: None (Preliminary result)   Collection Time: 05/05/15  1:01 PM  Result Value  Ref Range Status   Specimen Description ABSCESS  Final   Special Requests SPEC A ON SWAB RIGHT PELVIC ABSCESS  Final   Gram Stain   Final    FEW WBC PRESENT, PREDOMINANTLY MONONUCLEAR NO SQUAMOUS EPITHELIAL CELLS SEEN FEW GRAM POSITIVE COCCI IN PAIRS IN CLUSTERS Performed at [facility]    Culture   Final    NO ANAEROBES ISOLATED; CULTURE IN PROGRESS FOR 5 DAYS Performed at [facility]    Report Status PENDING  Incomplete  Culture, routine-abscess     Status: None (Preliminary result)   Collection Time: 05/05/15  1:01 PM  Result Value Ref Range Status   Specimen Description ABSCESS  Final   Special Requests SPEC A ON SWAB RIGHT PELVIC ABSCESS  Final   Gram Stain   Final    FEW WBC PRESENT, PREDOMINANTLY MONONUCLEAR NO SQUAMOUS EPITHELIAL CELLS SEEN FEW GRAM POSITIVE COCCI IN PAIRS IN  CLUSTERS Performed at [facility]    Culture   Final    Culture reincubated for better growth Performed at [facility]    Report Status PENDING  Incomplete  Tissue culture     Status: None (Preliminary result)   Collection Time: 05/05/15  1:13 PM  Result Value Ref Range Status   Specimen Description TISSUE  Final   Special Requests SPEC B IN CUP BONE RIGHT ISCHIUM  Final   Gram Stain   Final    RARE WBC PRESENT, PREDOMINANTLY MONONUCLEAR NO ORGANISMS SEEN Performed at [facility]    Culture   Final    NO GROWTH 1 DAY Performed at [facility]    Report Status PENDING  Incomplete    Studies/Results: No results found.  Assessment/Plan:  1) osteomyelitis - OR debridement 7/28 and abscess culture with GPC in gram stain, no growth to date.  Blood cultures with MRSA and Strep.   Will continue to monitor cultures, use aztreonam for now pending any growth Continue vancomycin  Scharlene Gloss, [place] for Infectious Disease [place] [email or web address] [phone] pager   [phone] cell 05/06/2015, 11:39 AM

## 2015-05-06 NOTE — Progress Notes (Signed)
Hypoglycemic Event  CBG: 52  Treatment: D50 IV 50 mL  Symptoms: None  Follow-up CBG: Time:11:30 CBG Result:**120  Possible Reasons for Event: Inadequate meal intake and Unknown  Comments/MD notified:hypoglycemic protocol    Jason [name] T  Remember to initiate Hypoglycemia Order Set & complete

## 2015-05-06 NOTE — Progress Notes (Signed)
Writer noticed increased bleeding after 3PM dressing change. Pts bed and dressing was saturated with blood. [place] physician notified as well as plastic surgeon Dr. Migdalia Dk. CBC obtained per [place]. Telephone order given by Dr. Migdalia Dk to only change sacral dressing daily and to apply surgicel to active bleed with wet to dry dressing. Nursing to continue monitor for s/s of bleeding.   2000: surgicel applied to active bleed with new wet to dry dressing. Pressure applied. Bleeding slightly improved. Nursing to continue to monitor.

## 2015-05-06 NOTE — Progress Notes (Signed)
PULMONARY / CRITICAL CARE MEDICINE   Name: Jason [name] MRN: [id number] DOB: 08-Jan-1986    ADMISSION DATE: 04/25/2015 CONSULTATION DATE: 05/01/2015  REFERRING MD : TRH  CHIEF COMPLAINT: Hypotension  INITIAL PRESENTATION: 29 year old male quad with MRSA bacteremia from multiple sources. Concern for endocarditis. Wounds have been checked and followed by general surgery and ortho. Patient has history of hypotension and concern for septic shock and levophed was started and PCCM consulted.  SIGNIFICANT EVENTS:  7/78>> Reconsulted for extensive post op bleeding and hypotension  SUBJECTIVE:   Transferred overnight from [facility] for extensive post op bleeding and hypotension  VITAL SIGNS: Temp:  [91.8 F (33.2 C)-98.3 F (36.8 C)] 97.5 F (36.4 C) (07/29 0845) Pulse Rate:  [57-77] 76 (07/29 0600) Resp:  [8-29] 22 (07/29 0600) BP: (68-156)/(40-144) 88/56 mmHg (07/29 0600) SpO2:  [92 %-100 %] 99 % (07/29 0600) Arterial Line BP: (91-148)/(44-93) 91/51 mmHg (07/28 1737) Weight:  [159 lb 13.3 oz (72.5 kg)] 159 lb 13.3 oz (72.5 kg) (07/29 0600) HEMODYNAMICS:   VENTILATOR SETTINGS:   INTAKE / OUTPUT:  Intake/Output Summary (Last 24 hours) at 05/06/15 0945 Last data filed at 05/06/15 0600  Gross per 24 hour  Intake 2176.01 ml  Output   1328 ml  Net 848.01 ml    PHYSICAL EXAMINATION: General:  NAD Neuro: awakens to voice, alert HEENT:  NCAT Cardiovascular:  RRR Lungs:  CTAB Abdomen:  Soft, nom distended, ostomy bag in place,   Musculoskeletal: LE contractures Skin: warm, dry, wound vac over sacral wound  LABS:  CBC  Recent Labs Lab 05/04/15 0530 05/05/15 0400 05/05/15 1620 05/05/15 1658 05/06/15 0430  WBC 26.1* 24.7*  --   --  50.0*  HGB 7.4* 6.9* 4.1* 7.5* 11.1*  HCT 23.6* 21.6* 12.0* 22.0* 31.1*  PLT 594* 565*  --   --  317   Coag's No results for input(s): APTT, INR in the last 168 hours. BMET  Recent Labs Lab 05/05/15 0400 05/05/15 1600  05/05/15 1620 05/05/15 1658 05/06/15 0430  NA 133* 135 138 140 132*  K 3.8 3.4* 3.7 3.7 3.6  CL 112* 112*  --   --  102  CO2 18* 21*  --   --  27  BUN 18 14  --   --  15  CREATININE 0.60* 0.58*  --   --  0.57*  GLUCOSE 169* 130* 130*  --  424*   Electrolytes  Recent Labs Lab 05/02/15 0536  05/05/15 0400 05/05/15 1600 05/06/15 0430  CALCIUM 7.2*  < > 7.4* 7.0* 6.6*  MG 1.1*  < > 1.7 1.5* 1.5*  PHOS 3.2  --   --   --  3.1  < > = values in this interval not displayed. Sepsis Markers No results for input(s): LATICACIDVEN, PROCALCITON, O2SATVEN in the last 168 hours. ABG  Recent Labs Lab 05/05/15 1658  PHART 7.251*  PCO2ART 36.7  PO2ART 118.0*   Liver Enzymes No results for input(s): AST, ALT, ALKPHOS, BILITOT, ALBUMIN in the last 168 hours. Cardiac Enzymes No results for input(s): TROPONINI, PROBNP in the last 168 hours. Glucose  Recent Labs Lab 05/05/15 0807 05/05/15 0943 05/05/15 1130 05/05/15 1407 05/05/15 1805 05/05/15 2232  GLUCAP 170* 152* 144* 141* 141* 170*    Imaging No results found.   ASSESSMENT / PLAN:  PULMONARY A:  At risk trali P:  O2 as needed to maintain saturation greater than 90%. Monitor for airway protection. F/u CXR for edema from products IS as  able  CARDIOVASCULAR Right Arm PICC 7/19>>> A:  Shock- presumed hemorraghic- improved with pressors TEE 7/28>>> NO vegetation, normal LV function  P:  S/p 3 u PRBC post op for volume resuscitation  Continue Midodrine 20mg  PO TID Neo to map goal, will monitor for tolerance Cbc in am  Cortisol if drops BP  RENAL A:  Hyperchloremia - resolved Hypomagnesemia - Resolved. Hyponatremia (etiology, dilution?) P:  BMP daily replete as needed I/Os Likely in am for lasix and follow na trend post resuscitation Mag resus with products, avoid gross crystalloid  GASTROINTESTINAL A:  No acute issue   P:  Advance diet per Plastic surg Zofran PRN nausea PPI 40  qD  HEMATOLOGIC A:  Leukocytosis - Improving. 48->26->24 Anemia  Peri-operative bleeding - large ischial wound s/p debridement  P:   Trend CBC, transfuse as needed scd  INFECTIOUS A: MRSA bacteremia, neg TEE E coli UTI  Possible osteomyelitis  P:  BCx2 MRSA Wound/tissue culture 7/28>>> Abscess culture 7/28>>>  Vancomycin 7/18>>> Aztreonam 7/18>>>  Consider dc aztreonam  ENDOCRINE A:  Possible adrenal insufficiency - Cortisol low but no response in BP to stress dose steroids. T1 DM  P:  SSI, home lantus  Consider stress dose steroids if not off pressors  NEUROLOGIC A: Quad post trauma Post op pain   P:  PRN fentanyl  Cont home lyrica Avoid oversedation    Alyssa A. Lincoln Brigham MD, [facility] Resident PGY-2 Pager [phone]   05/06/2015, 9:45 AM  STAFF NOTE: Linwood Dibbles, MD FACP have personally reviewed patient's available data, including medical history, events of note, physical examination and test results as part of my evaluation. I have discussed with resident/NP and other care providers such as pharmacist, RN and RRT. In addition, I personally evaluated patient and elicited key findings of: no distres, wound not bleeding, neo to map goals, cortisol noted at 10, would hold off stress roids as just off pressors and wound healing is crucial, if drops BP  Or not off pressors then re add stress roids, limit gross overload, resus with [prbc when indicated, cbc in am , coags in am to r/o consumption / dilutional coagulapathy, get coag snow, map goal 60 The patient is critically ill with multiple organ systems failure and requires high complexity decision making for assessment and support, frequent evaluation and titration of therapies, application of advanced monitoring technologies and extensive interpretation of multiple databases.   Critical Care Time devoted to patient care services described in this note is 30 Minutes. This time  reflects time of care of this signee: Merrie Roof, MD FACP. This critical care time does not reflect procedure time, or teaching time or supervisory time of PA/NP/Med student/Med Resident etc but could involve care discussion time. Rest per NP/medical resident whose note is outlined above and that I agree with   Lavon Paganini. Titus Mould, MD, [place] Pgr: [facility] 05/06/2015 3:14 PM

## 2015-05-07 ENCOUNTER — Inpatient Hospital Stay (HOSPITAL_COMMUNITY): Payer: Medicaid Other

## 2015-05-07 DIAGNOSIS — K650 Generalized (acute) peritonitis: Secondary | ICD-10-CM

## 2015-05-07 DIAGNOSIS — L0291 Cutaneous abscess, unspecified: Secondary | ICD-10-CM

## 2015-05-07 DIAGNOSIS — L8994 Pressure ulcer of unspecified site, stage 4: Secondary | ICD-10-CM

## 2015-05-07 LAB — CBC WITH DIFFERENTIAL/PLATELET
BASOS ABS: 0 10*3/uL (ref 0.0–0.1)
Basophils Absolute: 0 10*3/uL (ref 0.0–0.1)
Basophils Relative: 0 % (ref 0–1)
Basophils Relative: 0 % (ref 0–1)
EOS ABS: 0 10*3/uL (ref 0.0–0.7)
EOS PCT: 0 % (ref 0–5)
Eosinophils Absolute: 0 10*3/uL (ref 0.0–0.7)
Eosinophils Relative: 0 % (ref 0–5)
HCT: 19.3 % — ABNORMAL LOW (ref 39.0–52.0)
HEMATOCRIT: 23.9 % — AB (ref 39.0–52.0)
HEMOGLOBIN: 6.8 g/dL — AB (ref 13.0–17.0)
HEMOGLOBIN: 8.6 g/dL — AB (ref 13.0–17.0)
LYMPHS ABS: 4.5 10*3/uL — AB (ref 0.7–4.0)
LYMPHS PCT: 9 % — AB (ref 12–46)
LYMPHS PCT: 6 % — AB (ref 12–46)
Lymphs Abs: 3.4 10*3/uL (ref 0.7–4.0)
MCH: 28.8 pg (ref 26.0–34.0)
MCH: 30.8 pg (ref 26.0–34.0)
MCHC: 35.2 g/dL (ref 30.0–36.0)
MCHC: 36 g/dL (ref 30.0–36.0)
MCV: 81.8 fL (ref 78.0–100.0)
MCV: 85.7 fL (ref 78.0–100.0)
MONOS PCT: 6 % (ref 3–12)
Monocytes Absolute: 3 10*3/uL — ABNORMAL HIGH (ref 0.1–1.0)
Monocytes Absolute: 3.4 10*3/uL — ABNORMAL HIGH (ref 0.1–1.0)
Monocytes Relative: 6 % (ref 3–12)
NEUTROS ABS: 49.8 10*3/uL — AB (ref 1.7–7.7)
Neutro Abs: 42.3 10*3/uL — ABNORMAL HIGH (ref 1.7–7.7)
Neutrophils Relative %: 85 % — ABNORMAL HIGH (ref 43–77)
Neutrophils Relative %: 88 % — ABNORMAL HIGH (ref 43–77)
PLATELETS: 304 10*3/uL (ref 150–400)
Platelets: 258 10*3/uL (ref 150–400)
RBC: 2.36 MIL/uL — ABNORMAL LOW (ref 4.22–5.81)
RBC: 2.79 MIL/uL — AB (ref 4.22–5.81)
RDW: 16 % — ABNORMAL HIGH (ref 11.5–15.5)
RDW: 16 % — ABNORMAL HIGH (ref 11.5–15.5)
WBC: 49.8 10*3/uL — ABNORMAL HIGH (ref 4.0–10.5)
WBC: 56.6 10*3/uL — AB (ref 4.0–10.5)

## 2015-05-07 LAB — GLUCOSE, CAPILLARY
GLUCOSE-CAPILLARY: 185 mg/dL — AB (ref 65–99)
Glucose-Capillary: 120 mg/dL — ABNORMAL HIGH (ref 65–99)
Glucose-Capillary: 75 mg/dL (ref 65–99)
Glucose-Capillary: 123 mg/dL — ABNORMAL HIGH (ref 65–99)
Glucose-Capillary: 43 mg/dL — CL (ref 65–99)
Glucose-Capillary: 102 mg/dL — ABNORMAL HIGH (ref 65–99)
Glucose-Capillary: 57 mg/dL — ABNORMAL LOW (ref 65–99)
Glucose-Capillary: 71 mg/dL (ref 65–99)
Glucose-Capillary: 80 mg/dL (ref 65–99)
Glucose-Capillary: 117 mg/dL — ABNORMAL HIGH (ref 65–99)
Glucose-Capillary: 122 mg/dL — ABNORMAL HIGH (ref 65–99)

## 2015-05-07 LAB — BASIC METABOLIC PANEL
Anion gap: 3 — ABNORMAL LOW (ref 5–15)
BUN: 22 mg/dL — AB (ref 6–20)
CHLORIDE: 109 mmol/L (ref 101–111)
CO2: 19 mmol/L — ABNORMAL LOW (ref 22–32)
CREATININE: 0.71 mg/dL (ref 0.61–1.24)
Calcium: 7 mg/dL — ABNORMAL LOW (ref 8.9–10.3)
GFR calc non Af Amer: >60 mL/min (ref >60)
GLUCOSE: 75 mg/dL (ref 65–99)
POTASSIUM: 4.4 mmol/L (ref 3.5–5.1)
Sodium: 131 mmol/L — ABNORMAL LOW (ref 135–145)

## 2015-05-07 LAB — PREPARE RBC (CROSSMATCH)

## 2015-05-07 LAB — MAGNESIUM: Magnesium: 1.8 mg/dL (ref 1.7–2.4)

## 2015-05-07 MED ORDER — HYDROCORTISONE NA SUCCINATE PF 100 MG IJ SOLR
50.0000 mg | Freq: Four times a day (QID) | INTRAMUSCULAR | Status: DC
  Administered 2015-05-07 – 2015-05-11 (×16): 50 mg via INTRAVENOUS
  Filled 2015-05-07: qty 2
  Filled 2015-05-07 (×8): qty 1
  Filled 2015-05-07: qty 2
  Filled 2015-05-07 (×3): qty 1
  Filled 2015-05-07: qty 2
  Filled 2015-05-07 (×3): qty 1
  Filled 2015-05-07: qty 2
  Filled 2015-05-07 (×2): qty 1

## 2015-05-07 MED ORDER — DEXTROSE 50 % IV SOLN
INTRAVENOUS | Status: AC
  Filled 2015-05-07: qty 50

## 2015-05-07 MED ORDER — MAGNESIUM SULFATE 2 GM/50ML IV SOLN
2.0000 g | Freq: Once | INTRAVENOUS | Status: AC
  Administered 2015-05-07: 2 g via INTRAVENOUS
  Filled 2015-05-07: qty 50

## 2015-05-07 MED ORDER — SODIUM CHLORIDE 0.9 % IV SOLN
Freq: Once | INTRAVENOUS | Status: AC
  Administered 2015-05-07: 06:00:00 via INTRAVENOUS

## 2015-05-07 MED ORDER — DEXTROSE 50 % IV SOLN
1.0000 | Freq: Once | INTRAVENOUS | Status: AC
  Administered 2015-05-07: 50 mL via INTRAVENOUS

## 2015-05-07 NOTE — Progress Notes (Signed)
CRITICAL VALUE ALERT  Critical value received:  WBC 56.6  Date of notification:  05/07/15  Time of notification:  1100  Critical value read back:Yes.    Nurse who received alert:  Minna Merritts, RN  MD notified (1st page):  Dr. Lake Bells on the Unit and notified verbally  Time of first page:  NA  MD notified (2nd page):  Time of second page:  Responding MD:  Dr. Lake Bells  Time MD responded:  1100

## 2015-05-07 NOTE — Progress Notes (Signed)
Hypoglycemic Event  CBG: 42  Treatment: D50 IV 50 mL  Symptoms: Nervous/irritable  Follow-up CBG: Time:7:25 CBG Result:123  Possible Reasons for Event: Inadequate meal intake  Comments/MD notified:per protocol    Norva Pavlov T  Remember to initiate Hypoglycemia Order Set & complete

## 2015-05-07 NOTE — Progress Notes (Signed)
Called OR to request more surgicel for patient's dressing changes.

## 2015-05-07 NOTE — Progress Notes (Signed)
[place] Progress Note Patient Name: Jason [name] DOB: 1986-09-26 MRN: [id number]   Date of Service  05/07/2015  HPI/Events of Note  Nurse noting patient to have increasing oxygen requirement and exam suggests airway congestion.  CXR earlier today with volume overload.  Work of breathing slightly increased.  eICU Interventions  cxr now.  Based on results may consider small dose of lasix.     Intervention Category Major Interventions: Hypoxemia - evaluation and management  Mauri Brooklyn, P 05/07/2015, 11:16 PM

## 2015-05-07 NOTE — Progress Notes (Signed)
[place] Progress Note Patient Name: Jason [name] DOB: Mar 21, 1986 MRN: [id number]   Date of Service  05/07/2015  HPI/Events of Note  Patient continues to bleed from surgical site.  Hgb drop from 9.0 to 6.8.  eICU Interventions  Plan: Transfuse 1 unit of pRBC Post-transfusion CBC Update surgical team in AM     Intervention Category Intermediate Interventions: Bleeding - evaluation and treatment with blood products  Zeferino Mounts 05/07/2015, 3:18 AM

## 2015-05-07 NOTE — Progress Notes (Signed)
Patient lethargic, responds to pain. No respiratory distress noted. BP and O2 WNL. Will continue to monitor. Unable to give 0800 meds due to lethargy. Patient also had hypoglycemic event for night shift nurse. Current blood sugar 102. Gaylyn Lambert, NP aware and notified

## 2015-05-07 NOTE — Progress Notes (Signed)
[place] for Infectious Disease  Date of Admission:  04/25/2015  Antibiotics: Vancomycin and aztreonam  Subjective: No complaints, more awake  Objective: Temp:  [97.2 F (36.2 C)-97.7 F (36.5 C)] 97.2 F (36.2 C) (07/30 0817) Pulse Rate:  [59-82] 76 (07/30 0715) Resp:  [9-26] 15 (07/30 0900) BP: (55-144)/(21-112) 81/50 mmHg (07/30 0900) SpO2:  [91 %-100 %] 100 % (07/30 0800) Arterial Line BP: (107)/(55) 107/55 mmHg (07/30 0611)  General: awake, nad Skin: no rashes Lungs: CTA, anterior exam Cor: RRR without m Abdomen: soft, nt, nd Ext; no edema; right arm picc without erythema  Lab Results Lab Results  Component Value Date   WBC 49.8* 05/07/2015   HGB 6.8* 05/07/2015   HCT 19.3* 05/07/2015   MCV 81.8 05/07/2015   PLT 304 05/07/2015    Lab Results  Component Value Date   CREATININE 0.57* 05/06/2015   BUN 15 05/06/2015   NA 132* 05/06/2015   K 3.6 05/06/2015   CL 102 05/06/2015   CO2 27 05/06/2015    Lab Results  Component Value Date   ALT 7* 04/28/2015   AST 8* 04/28/2015   ALKPHOS 173* 04/28/2015   BILITOT 0.4 04/28/2015      Microbiology: Recent Results (from the past 240 hour(s))  Culture, blood (routine x 2)     Status: None   Collection Time: 04/28/15  1:20 PM  Result Value Ref Range Status   Specimen Description BLOOD LEFT ARM  Final   Special Requests IN PEDIATRIC BOTTLE 1CC  Final   Culture  Setup Time   Final    GRAM POSITIVE COCCI IN CLUSTERS AEROBIC BOTTLE ONLY CRITICAL RESULT CALLED TO, READ BACK BY AND VERIFIED WITH: Hetty Blend RN 2222 04/30/15 A BROWNING    Culture   Final    MICROAEROPHILIC STREPTOCOCCI Standardized susceptibility testing for this organism is not available. Performed at [facility]    Report Status 05/03/2015 FINAL  Final  Culture, blood (routine x 2)     Status: None   Collection Time: 04/28/15  5:50 PM  Result Value Ref Range Status   Specimen Description BLOOD PICC LINE  Final   Special  Requests BOTTLES DRAWN AEROBIC AND ANAEROBIC 5CC EACH  Final   Culture   Final    NO GROWTH 5 DAYS Performed at [facility]    Report Status 05/03/2015 FINAL  Final  Culture, blood (routine x 2)     Status: None (Preliminary result)   Collection Time: 05/04/15  6:30 PM  Result Value Ref Range Status   Specimen Description BLOOD PICC LINE  Final   Special Requests BOTTLES DRAWN AEROBIC AND ANAEROBIC 5ML  Final   Culture   Final    NO GROWTH 2 DAYS Performed at [facility]    Report Status PENDING  Incomplete  Culture, blood (routine x 2)     Status: None (Preliminary result)   Collection Time: 05/04/15  6:30 PM  Result Value Ref Range Status   Specimen Description BLOOD PICC LINE  Final   Special Requests BOTTLES DRAWN AEROBIC AND ANAEROBIC 5ML  Final   Culture   Final    NO GROWTH 2 DAYS Performed at [facility]    Report Status PENDING  Incomplete  Anaerobic culture     Status: None (Preliminary result)   Collection Time: 05/05/15  1:01 PM  Result Value Ref Range Status   Specimen Description ABSCESS  Final   Special Requests SPEC A  ON SWAB RIGHT PELVIC ABSCESS  Final   Gram Stain   Final    FEW WBC PRESENT, PREDOMINANTLY MONONUCLEAR NO SQUAMOUS EPITHELIAL CELLS SEEN FEW GRAM POSITIVE COCCI IN PAIRS IN CLUSTERS Performed at [facility]    Culture   Final    NO ANAEROBES ISOLATED; CULTURE IN PROGRESS FOR 5 DAYS Performed at [facility]    Report Status PENDING  Incomplete  Culture, routine-abscess     Status: None (Preliminary result)   Collection Time: 05/05/15  1:01 PM  Result Value Ref Range Status   Specimen Description ABSCESS  Final   Special Requests SPEC A ON SWAB RIGHT PELVIC ABSCESS  Final   Gram Stain   Final    FEW WBC PRESENT, PREDOMINANTLY MONONUCLEAR NO SQUAMOUS EPITHELIAL CELLS SEEN FEW GRAM POSITIVE COCCI IN PAIRS IN CLUSTERS Performed at [facility]    Culture   Final    MODERATE  STAPHYLOCOCCUS AUREUS Performed at [facility]    Report Status PENDING  Incomplete  Tissue culture     Status: None (Preliminary result)   Collection Time: 05/05/15  1:13 PM  Result Value Ref Range Status   Specimen Description TISSUE  Final   Special Requests SPEC B IN CUP BONE RIGHT ISCHIUM  Final   Gram Stain   Final    RARE WBC PRESENT, PREDOMINANTLY MONONUCLEAR NO ORGANISMS SEEN Performed at [facility]    Culture   Final    FEW STAPHYLOCOCCUS AUREUS Note: RIFAMPIN AND GENTAMICIN SHOULD NOT BE USED AS SINGLE DRUGS FOR TREATMENT OF STAPH INFECTIONS. 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 Respiratory distress.  EXAM: PORTABLE CHEST - 1 VIEW  COMPARISON:  Plain film 04/25/2015, 03/30/2015, CT 02/20/2015  FINDINGS: Cardiomediastinal silhouette likely unchanged, partially obscured by overlying lung/ pleural disease.  Interval development of dense bilateral lung opacities at the bases. No pneumothorax.  Interval placement of right upper extremity PICC, with the tip appearing to terminate at the superior vena cava.  IMPRESSION: Interval development of large bilateral pleural effusions with associated atelectasis and/ or consolidation.  Interval placement of right upper extremity PICC, appearing to terminate at the superior vena cava.  Signed,  Dulcy Fanny. Earleen Newport, DO  [facility]  [facility]   Electronically Signed   By: Corrie Mckusick D.O.   On: 05/07/2015 09:07    Assessment/Plan:  1) osteomyelitis - OR debridement 7/28 and abscess culture with GPC in gram stain, no growth to date.  Blood cultures with MRSA and Strep.   Will continue to monitor cultures Continue vancomycin Growing Staph aureus in bone and wound and blood, so will d/c  aztreonam Some bleeding  Ralph Brouwer, Herbie [place], [place] [facility] Infectious [facility] [email or web address] [phone] pager   [phone] cell 05/07/2015, 10:30 AM

## 2015-05-07 NOTE — Progress Notes (Signed)
2 Days Post-Op  Subjective: The patient underwent debridement on Thursday and had a bleeding episode in PACU that was controlled with electrocautery.  He was doing slightly better until last night he started bleeding from the wound again.  I was called this morning and came in using electrocautery again to stop the bleeding.  It was not as bad this time.  The left ischial ulcer was the one bleeding at the muscle.  It was controlled with the bovie and surgicel was placed.  Objective: Vital signs in last 24 hours: Temp:  [97.3 F (36.3 C)-97.7 F (36.5 C)] 97.5 F (36.4 C) (07/30 0631) Pulse Rate:  [59-82] 76 (07/30 0715) Resp:  [9-26] 14 (07/30 0730) BP: (55-134)/(21-112) 87/57 mmHg (07/30 0700) SpO2:  [91 %-100 %] 100 % (07/30 0715) Arterial Line BP: (107)/(55) 107/55 mmHg (07/30 0611) Last BM Date: 05/05/15  Intake/Output from previous day: 07/29 0701 - 07/30 0700 In: 1618.5 [P.O.:860; I.V.:558.5; IV Piggyback:200] Out: 380 [Urine:380] Intake/Output this shift:    General appearance: alert and cooperative Incision/Wound:as above.  Lab Results:   Recent Labs  05/06/15 1849 05/07/15 0207  WBC 41.1* 49.8*  HGB 9.0* 6.8*  HCT 25.6* 19.3*  PLT 305 304   BMET  Recent Labs  05/05/15 1600 05/05/15 1620 05/05/15 1658 05/06/15 0430  NA 135 138 140 132*  K 3.4* 3.7 3.7 3.6  CL 112*  --   --  102  CO2 21*  --   --  27  GLUCOSE 130* 130*  --  424*  BUN 14  --   --  15  CREATININE 0.58*  --   --  0.57*  CALCIUM 7.0*  --   --  6.6*   PT/INR  Recent Labs  05/06/15 1600  LABPROT 20.8*  INR 1.80*   ABG  Recent Labs  05/05/15 1658  PHART 7.251*  HCO3 16.1*    Studies/Results: No results found.  Anti-infectives: Anti-infectives    Start     Dose/Rate Route Frequency Ordered Stop   05/06/15 0000  vancomycin (VANCOCIN) IVPB 750 mg/150 ml premix     750 mg 150 mL/hr over 60 Minutes Intravenous Every 48 hours 05/05/15 2357     04/28/15 2000  vancomycin  (VANCOCIN) IVPB 750 mg/150 ml premix  Status:  Discontinued     750 mg 150 mL/hr over 60 Minutes Intravenous Every 24 hours 04/28/15 0731 05/04/15 1932   04/26/15 0800  vancomycin (VANCOCIN) IVPB 750 mg/150 ml premix  Status:  Discontinued     750 mg 150 mL/hr over 60 Minutes Intravenous Every 12 hours 04/25/15 1715 04/28/15 0731   04/26/15 0200  aztreonam (AZACTAM) 1 g in dextrose 5 % 50 mL IVPB     1 g 100 mL/hr over 30 Minutes Intravenous Every 8 hours 04/25/15 1919     04/25/15 1715  vancomycin (VANCOCIN) IVPB 750 mg/150 ml premix     750 mg 150 mL/hr over 60 Minutes Intravenous STAT 04/25/15 1715 04/25/15 1906   04/25/15 1645  aztreonam (AZACTAM) 2 g in dextrose 5 % 50 mL IVPB     2 g 100 mL/hr over 30 Minutes Intravenous  Once 04/25/15 1643 04/25/15 1807      Assessment/Plan: s/p Procedure(s): IRRIGATION AND DEBRIDEMENT SACRAL ULCER (Bilateral) Checking for DIC panel, blood being given.  Change dressings to daily for now.  LOS: 12 days    [facility] 05/07/2015

## 2015-05-07 NOTE — Progress Notes (Addendum)
PULMONARY / CRITICAL CARE MEDICINE   Name: Jason [name] MRN: [id number] DOB: [birth year]    ADMISSION DATE: 04/25/2015 CONSULTATION DATE: 05/01/2015  REFERRING MD : TRH  CHIEF COMPLAINT: Hypotension  INITIAL PRESENTATION: 29 year old male quad with MRSA bacteremia from multiple sources. Concern for endocarditis. Wounds have been checked and followed by general surgery and ortho. Patient has history of hypotension and concern for septic shock and levophed was started and PCCM consulted.  SIGNIFICANT EVENTS:  7/78>> Reconsulted for extensive post op bleeding and hypotension  SUBJECTIVE: Debrided again this morning, bleeding again with that, still hypotensive and on pressors  VITAL SIGNS: Temp:  [97.2 F (36.2 C)-97.7 F (36.5 C)] 97.2 F (36.2 C) (07/30 0817) Pulse Rate:  [59-82] 76 (07/30 0715) Resp:  [9-26] 12 (07/30 1000) BP: (55-144)/(21-112) 124/81 mmHg (07/30 1000) SpO2:  [91 %-100 %] 100 % (07/30 0800) Arterial Line BP: (107)/(55) 107/55 mmHg (07/30 0611) HEMODYNAMICS:   VENTILATOR SETTINGS:   INTAKE / OUTPUT:  Intake/Output Summary (Last 24 hours) at 05/07/15 1056 Last data filed at 05/07/15 1007  Gross per 24 hour  Intake 1659.73 ml  Output    415 ml  Net 1244.73 ml    PHYSICAL EXAMINATION:  Gen: chronically ill appearing HENT: NCAT, OP clear PULM: CTA B, normal effort CV: RRR, no mgr, trace edema GI: BS+, soft, nontender Derm: sacral wound dressed Neuro: asleep on my exam but has been awak this morning   LABS:  CBC  Recent Labs Lab 05/06/15 0430 05/06/15 1849 05/07/15 0207  WBC 50.0* 41.1* 49.8*  HGB 11.1* 9.0* 6.8*  HCT 31.1* 25.6* 19.3*  PLT 317 305 304   Coag's  Recent Labs Lab 05/06/15 1600  INR 1.80*   BMET  Recent Labs Lab 05/05/15 0400 05/05/15 1600 05/05/15 1620 05/05/15 1658 05/06/15 0430  NA 133* 135 138 140 132*  K 3.8 3.4* 3.7 3.7 3.6  CL 112* 112*  --   --  102  CO2 18* 21*  --   --  27  BUN 18 14   --   --  15  CREATININE 0.60* 0.58*  --   --  0.57*  GLUCOSE 169* 130* 130*  --  424*   Electrolytes  Recent Labs Lab 05/02/15 0536  05/05/15 0400 05/05/15 1600 05/06/15 0430 05/07/15 0212  CALCIUM 7.2*  < > 7.4* 7.0* 6.6*  --   MG 1.1*  < > 1.7 1.5* 1.5* 1.8  PHOS 3.2  --   --   --  3.1  --   < > = values in this interval not displayed. Sepsis Markers No results for input(s): LATICACIDVEN, PROCALCITON, O2SATVEN in the last 168 hours. ABG  Recent Labs Lab 05/05/15 1658  PHART 7.251*  PCO2ART 36.7  PO2ART 118.0*   Liver Enzymes No results for input(s): AST, ALT, ALKPHOS, BILITOT, ALBUMIN in the last 168 hours. Cardiac Enzymes No results for input(s): TROPONINI, PROBNP in the last 168 hours. 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 Respiratory distress.  EXAM: PORTABLE CHEST - 1 VIEW  COMPARISON:  Plain film 04/25/2015, 03/30/2015, CT 02/20/2015  FINDINGS: Cardiomediastinal silhouette likely unchanged, partially obscured by overlying lung/ pleural disease.  Interval development of dense bilateral lung  opacities at the bases. No pneumothorax.  Interval placement of right upper extremity PICC, with the tip appearing to terminate at the superior vena cava.  IMPRESSION: Interval development of large bilateral pleural effusions with associated atelectasis and/ or consolidation.  Interval placement of right upper extremity PICC, appearing to terminate at the superior vena cava.  Signed,  Dulcy Fanny. Earleen Newport, DO  [facility]  [facility]   Electronically Signed   By: Corrie Mckusick D.O.   On: 05/07/2015 09:07     ASSESSMENT / PLAN:  PULMONARY A:  No acute issues P:  O2 as needed  to maintain saturation greater than 90% Monitor for airway protection IS as able  CARDIOVASCULAR Right Arm PICC 7/19>>> A:  Shock- chronic with some degree of hemorraghic- also likely adrenal insufficiency as cortisol only 10 when hypotensive on 7/24 TEE 7/28>>> NO vegetation, normal LV function  P:  Add hydrocortisone Continue Midodrine 20mg  PO TID Neo for MAP > 50 Monitor for bleeding  RENAL A:  Hyperchloremia - resolved Hypomagnesemia - Resolved. Hyponatremia mild P:  Monitor BMET and UOP Replace electrolytes as needed Replete Mag today  GASTROINTESTINAL A:  No acute issue   P:  Advance diet per Plastic surg Zofran PRN nausea PPI 40 qD  HEMATOLOGIC A:  Anemia  Peri-operative bleeding - large ischial wound s/p debridement  P:  Trend CBC, transfuse for hgb < 7 SCD  INFECTIOUS A: MRSA bacteremia, neg TEE E coli UTI  Possible osteomyelitis  P:  BCx2 7/18 >  MRSA Wound/tissue culture 7/28>>> Abscess culture 7/28>>>  Vancomycin 7/18>>> Aztreonam 7/18>>> 7/30  ENDOCRINE A:  Possible adrenal insufficiency  T1 DM  P:  SSI, home lantus  Start hydrocortisone  NEUROLOGIC A: Quad post trauma Post op pain   P:  PRN fentanyl  Cont home lyrica Avoid oversedation    CC time 40 minutes  Roselie Awkward, MD [place] P[facility]M Pager: [phone] Cell: [phone] After 3pm or if no response, call [phone]     05/07/2015 10:56 AM

## 2015-05-07 NOTE — Progress Notes (Signed)
Called Dr. Migdalia Dk to notify her of the patient's persistent bleeding wound despite the redressing of the wound with the stericel and applied pressure, and drop in hemoglobin from 11.0 to 9.0 to the most recent CBC of 6.8.  Dr. Migdalia Dk requested a "bovie" machine, stick, and pad.  I called the OR and they said they would have one ready to bring to 2MW, and that she is on her way now.

## 2015-05-07 NOTE — Progress Notes (Signed)
CRITICAL VALUE ALERT  Critical value received:  Hemoglobin 6.8  Date of notification:  05/07/15  Time of notification:  3:16  Critical value read back:Yes.    Nurse who received alert:  Adele Dan  MD notified (1st page):  E-link, E. Deterding  Time of first page:  3:16  MD notified (2nd page):  Time of second page:  Responding MD:  [place] Calix Deterding  Time MD responded:  3:16

## 2015-05-07 NOTE — Progress Notes (Signed)
Went to recheck patient's new dressing to find bedding completely saturated with blood.  Took dressing down, and the surgical site is still actively bleeding with no evidence of clotting.  Repacked wound using surgiseal and a reinforced wet to dry dressing.  Called E-link to notify doctor, and to clarify fluid orders.  Verbal orders given for KVO (not lactated ringers at 58ml/hr) and STAT CBC.  STAT CBC has been drawn and sent to lab.  Will continue to monitor and assess.

## 2015-05-08 ENCOUNTER — Inpatient Hospital Stay (HOSPITAL_COMMUNITY): Payer: Medicaid Other

## 2015-05-08 LAB — CBC WITH DIFFERENTIAL/PLATELET
BASOS ABS: 0 10*3/uL (ref 0.0–0.1)
Basophils Absolute: 0 10*3/uL (ref 0.0–0.1)
Basophils Relative: 0 % (ref 0–1)
Basophils Relative: 0 % (ref 0–1)
EOS ABS: 0 10*3/uL (ref 0.0–0.7)
EOS PCT: 0 % (ref 0–5)
Eosinophils Absolute: 0 10*3/uL (ref 0.0–0.7)
Eosinophils Relative: 0 % (ref 0–5)
HCT: 18.5 % — ABNORMAL LOW (ref 39.0–52.0)
HEMATOCRIT: 26.6 % — AB (ref 39.0–52.0)
HEMOGLOBIN: 6.6 g/dL — AB (ref 13.0–17.0)
HEMOGLOBIN: 9.4 g/dL — AB (ref 13.0–17.0)
LYMPHS PCT: 3 % — AB (ref 12–46)
Lymphocytes Relative: 3 % — ABNORMAL LOW (ref 12–46)
Lymphs Abs: 1.6 10*3/uL (ref 0.7–4.0)
Lymphs Abs: 1.4 10*3/uL (ref 0.7–4.0)
MCH: 30.1 pg (ref 26.0–34.0)
MCH: 30.1 pg (ref 26.0–34.0)
MCHC: 35.7 g/dL (ref 30.0–36.0)
MCHC: 35.3 g/dL (ref 30.0–36.0)
MCV: 84.5 fL (ref 78.0–100.0)
MCV: 85.3 fL (ref 78.0–100.0)
MONO ABS: 2.2 10*3/uL — AB (ref 0.1–1.0)
Monocytes Absolute: 1.4 10*3/uL — ABNORMAL HIGH (ref 0.1–1.0)
Monocytes Relative: 4 % (ref 3–12)
Monocytes Relative: 3 % (ref 3–12)
Neutro Abs: 50.4 10*3/uL — ABNORMAL HIGH (ref 1.7–7.7)
Neutro Abs: 45.5 10*3/uL — ABNORMAL HIGH (ref 1.7–7.7)
Neutrophils Relative %: 93 % — ABNORMAL HIGH (ref 43–77)
Neutrophils Relative %: 94 % — ABNORMAL HIGH (ref 43–77)
PLATELETS: 246 10*3/uL (ref 150–400)
PLATELETS: 233 10*3/uL (ref 150–400)
RBC: 2.19 MIL/uL — ABNORMAL LOW (ref 4.22–5.81)
RBC: 3.12 MIL/uL — ABNORMAL LOW (ref 4.22–5.81)
RDW: 15.7 % — AB (ref 11.5–15.5)
RDW: 14.6 % (ref 11.5–15.5)
WBC MORPHOLOGY: INCREASED
WBC: 54.2 10*3/uL (ref 4.0–10.5)
WBC: 48.3 10*3/uL — ABNORMAL HIGH (ref 4.0–10.5)

## 2015-05-08 LAB — POCT I-STAT 3, ART BLOOD GAS (G3+)
ACID-BASE DEFICIT: 6 mmol/L — AB (ref 0.0–2.0)
Acid-base deficit: 8 mmol/L — ABNORMAL HIGH (ref 0.0–2.0)
BICARBONATE: 18.7 meq/L — AB (ref 20.0–24.0)
BICARBONATE: 19.3 meq/L — AB (ref 20.0–24.0)
O2 SAT: 99 %
O2 Saturation: 90 %
PH ART: 7.275 — AB (ref 7.350–7.450)
PO2 ART: 61 mmHg — AB (ref 80.0–100.0)
Patient temperature: 95.8
TCO2: 20 mmol/L (ref 0–100)
TCO2: 20 mmol/L (ref 0–100)
pCO2 arterial: 39.5 mmHg (ref 35.0–45.0)
pCO2 arterial: 31.5 mmHg — ABNORMAL LOW (ref 35.0–45.0)
pH, Arterial: 7.385 (ref 7.350–7.450)
pO2, Arterial: 156 mmHg — ABNORMAL HIGH (ref 80.0–100.0)

## 2015-05-08 LAB — GLUCOSE, CAPILLARY
GLUCOSE-CAPILLARY: 113 mg/dL — AB (ref 65–99)
GLUCOSE-CAPILLARY: 98 mg/dL (ref 65–99)
Glucose-Capillary: 125 mg/dL — ABNORMAL HIGH (ref 65–99)
Glucose-Capillary: 126 mg/dL — ABNORMAL HIGH (ref 65–99)
Glucose-Capillary: 150 mg/dL — ABNORMAL HIGH (ref 65–99)

## 2015-05-08 LAB — BASIC METABOLIC PANEL
ANION GAP: 6 (ref 5–15)
BUN: 24 mg/dL — ABNORMAL HIGH (ref 6–20)
CALCIUM: 7.2 mg/dL — AB (ref 8.9–10.3)
CHLORIDE: 105 mmol/L (ref 101–111)
CO2: 21 mmol/L — AB (ref 22–32)
Creatinine, Ser: 0.69 mg/dL (ref 0.61–1.24)
GFR calc Af Amer: >60 mL/min (ref >60)
GFR calc non Af Amer: >60 mL/min (ref >60)
Glucose, Bld: 129 mg/dL — ABNORMAL HIGH (ref 65–99)
Potassium: 4.5 mmol/L (ref 3.5–5.1)
SODIUM: 132 mmol/L — AB (ref 135–145)

## 2015-05-08 LAB — LACTIC ACID, PLASMA
Lactic Acid, Venous: 1.4 mmol/L (ref 0.5–2.0)
Lactic Acid, Venous: 2 mmol/L (ref 0.5–2.0)

## 2015-05-08 LAB — MAGNESIUM: Magnesium: 2.1 mg/dL (ref 1.7–2.4)

## 2015-05-08 LAB — PREPARE RBC (CROSSMATCH)

## 2015-05-08 MED ORDER — MIDAZOLAM HCL 2 MG/2ML IJ SOLN
INTRAMUSCULAR | Status: AC
  Administered 2015-05-08: 2 mg
  Filled 2015-05-08: qty 2

## 2015-05-08 MED ORDER — ALBUTEROL SULFATE (2.5 MG/3ML) 0.083% IN NEBU
2.5000 mg | INHALATION_SOLUTION | RESPIRATORY_TRACT | Status: DC | PRN
  Administered 2015-05-13 – 2015-05-31 (×2): 2.5 mg via RESPIRATORY_TRACT
  Filled 2015-05-08 (×3): qty 3

## 2015-05-08 MED ORDER — DEXTROSE 5 % IV SOLN
2.0000 g | Freq: Three times a day (TID) | INTRAVENOUS | Status: DC
  Administered 2015-05-08 – 2015-05-11 (×10): 2 g via INTRAVENOUS
  Filled 2015-05-08 (×13): qty 2

## 2015-05-08 MED ORDER — FENTANYL CITRATE (PF) 100 MCG/2ML IJ SOLN
INTRAMUSCULAR | Status: AC
  Administered 2015-05-08: 100 ug
  Filled 2015-05-08: qty 2

## 2015-05-08 MED ORDER — CHLORHEXIDINE GLUCONATE 0.12 % MT SOLN
15.0000 mL | Freq: Two times a day (BID) | OROMUCOSAL | Status: DC
  Administered 2015-05-08 – 2015-05-14 (×13): 15 mL via OROMUCOSAL
  Filled 2015-05-08 (×9): qty 15

## 2015-05-08 MED ORDER — FUROSEMIDE 10 MG/ML IJ SOLN
20.0000 mg | Freq: Once | INTRAMUSCULAR | Status: AC
  Administered 2015-05-08: 20 mg via INTRAVENOUS
  Filled 2015-05-08: qty 2

## 2015-05-08 MED ORDER — DOCUSATE SODIUM 50 MG/5ML PO LIQD: 100.0000 mg | Freq: Two times a day (BID) | ORAL | Status: DC | PRN

## 2015-05-08 MED ORDER — CETYLPYRIDINIUM CHLORIDE 0.05 % MT LIQD
7.0000 mL | Freq: Four times a day (QID) | OROMUCOSAL | Status: DC
  Administered 2015-05-08 – 2015-05-13 (×20): 7 mL via OROMUCOSAL

## 2015-05-08 MED ORDER — MIDAZOLAM HCL 2 MG/2ML IJ SOLN
2.0000 mg | INTRAMUSCULAR | Status: DC | PRN
  Filled 2015-05-08: qty 2

## 2015-05-08 MED ORDER — ALBUTEROL SULFATE (2.5 MG/3ML) 0.083% IN NEBU
2.5000 mg | INHALATION_SOLUTION | RESPIRATORY_TRACT | Status: DC
  Administered 2015-05-08 – 2015-06-01 (×141): 2.5 mg via RESPIRATORY_TRACT
  Filled 2015-05-08 (×142): qty 3

## 2015-05-08 MED ORDER — FENTANYL CITRATE (PF) 100 MCG/2ML IJ SOLN
100.0000 ug | INTRAMUSCULAR | Status: DC | PRN
  Filled 2015-05-08: qty 2

## 2015-05-08 MED ORDER — SODIUM CHLORIDE 0.9 % IV SOLN: Freq: Once | INTRAVENOUS | Status: DC

## 2015-05-08 MED ORDER — FENTANYL CITRATE (PF) 100 MCG/2ML IJ SOLN
100.0000 ug | INTRAMUSCULAR | Status: DC | PRN
  Administered 2015-05-08 – 2015-05-25 (×50): 100 ug via INTRAVENOUS
  Filled 2015-05-08 (×50): qty 2

## 2015-05-08 MED ORDER — SODIUM CHLORIDE 0.9 % IV SOLN
Freq: Once | INTRAVENOUS | Status: AC
  Administered 2015-05-08: 05:00:00 via INTRAVENOUS

## 2015-05-08 MED ORDER — PANTOPRAZOLE SODIUM 40 MG PO PACK
40.0000 mg | PACK | Freq: Every day | ORAL | Status: DC
  Administered 2015-05-08 – 2015-06-01 (×25): 40 mg
  Filled 2015-05-08 (×25): qty 20

## 2015-05-08 MED ORDER — INSULIN ASPART 100 UNIT/ML ~~LOC~~ SOLN
2.0000 [IU] | SUBCUTANEOUS | Status: DC
  Administered 2015-05-10 (×2): 2 [IU] via SUBCUTANEOUS
  Administered 2015-05-10: 4 [IU] via SUBCUTANEOUS
  Administered 2015-05-10 (×2): 2 [IU] via SUBCUTANEOUS
  Administered 2015-05-11 (×2): 4 [IU] via SUBCUTANEOUS
  Administered 2015-05-11: 2 [IU] via SUBCUTANEOUS

## 2015-05-08 MED ORDER — MIDAZOLAM HCL 2 MG/2ML IJ SOLN
2.0000 mg | INTRAMUSCULAR | Status: DC | PRN
  Administered 2015-05-08: 2 mg via INTRAVENOUS
  Filled 2015-05-08 (×4): qty 2

## 2015-05-08 MED ORDER — PREGABALIN 100 MG PO CAPS
200.0000 mg | ORAL_CAPSULE | Freq: Every day | ORAL | Status: DC
  Administered 2015-05-08 – 2015-05-31 (×24): 200 mg via ORAL
  Filled 2015-05-08: qty 2
  Filled 2015-05-08: qty 4
  Filled 2015-05-08 (×2): qty 2
  Filled 2015-05-08: qty 4
  Filled 2015-05-08 (×2): qty 2
  Filled 2015-05-08: qty 4
  Filled 2015-05-08 (×5): qty 2
  Filled 2015-05-08: qty 4
  Filled 2015-05-08: qty 2
  Filled 2015-05-08: qty 4
  Filled 2015-05-08: qty 2
  Filled 2015-05-08 (×2): qty 4
  Filled 2015-05-08 (×5): qty 2

## 2015-05-08 NOTE — Progress Notes (Signed)
Notified Dr. Tamala Julian of patient's change in [place].  Patient is very difficult to arouse, no longer responding to his name nor answering questions. Patient does open eyes to painful stimulus. Patient's stats are: HR 79, BP 100/43 MAP 59 (arterial line), RR 16, and O2 100 on 5L [place].  CBG was 112, checked at 6:12.  Orders received for a STAT ABG.

## 2015-05-08 NOTE — Procedures (Signed)
PCCM Video Bronchoscopy Procedure Note  The patient was informed of the risks (including but not limited to bleeding, infection, respiratory failure, lung injury, tooth/oral injury) and benefits of the procedure and gave consent, see chart.  Indication: Acute respiratory failure  Post Procedure Diagnosis: Mucus plugging bilateral lower lobes  Location: [facility] 34M ICU  Condition pre procedure: Critically ill on vent  Medications for procedure: Versed 1mg , Fentanyl 136mcg, Rocuronium 50mg   Procedure description: The bronchoscope was introduced through the endotracheal tube and passed to the bilateral lungs to the level of the subsegmental bronchi throughout the tracheobronchial tree.  Airway exam revealed copious yellow to brown secretions throughout the tracheobronchial tree pooling in and obstructing the lower lobes of both lungs.  They were suctioned and all airways were patent at the completion of the procedure.  Procedures performed: Therapeutic aspiration of secretions from the airways   Specimens sent: Tracheal aspirate culture  Condition post procedure: Critically ill, on vent  EBL: none  Complications: none immediate  Roselie Awkward, MD [place] PCCM Pager: [phone] Cell: [phone] After 3pm or if no response, call [phone]

## 2015-05-08 NOTE — Progress Notes (Signed)
STAT ABG results called into Dr. Tamala Julian by RT.  Patient is more alert, now responding to voice and speaking, still not following commands regularly.  Orders received for another CHX.

## 2015-05-08 NOTE — Progress Notes (Signed)
[facility] PCCM  Bronch showed copious pus throughout both lungs CXR reviewed post bronchoscopy showed bilateral airspace disease Add back aztreonam for HCAP coverage Resp culture sent  Called mother prior to intubation to update, had to leave message  Roselie Awkward, MD [place] PCCM Pager: [phone] Cell: [phone] After 3pm or if no response, call [phone]

## 2015-05-08 NOTE — Progress Notes (Signed)
[place] for Infectious Disease  Date of Admission:  04/25/2015  Antibiotics: Vancomycin   Subjective: No complaints  Objective: Temp:  [94.3 F (34.6 C)-97.5 F (36.4 C)] 97.5 F (36.4 C) (07/31 1146) Pulse Rate:  [62-110] 103 (07/31 1345) Resp:  [8-34] 20 (07/31 1345) BP: (65-89)/(33-62) 68/40 mmHg (07/31 1200) SpO2:  [49 %-100 %] 97 % (07/31 1345) FiO2 (%):  [80 %-100 %] 80 % (07/31 1205)  General: awake, nad Skin: no rashes  Ext; no edema; right arm picc without erythema  Lab Results Lab Results  Component Value Date   WBC 48.3* 05/08/2015   HGB 9.4* 05/08/2015   HCT 26.6* 05/08/2015   MCV 85.3 05/08/2015   PLT 233 05/08/2015    Lab Results  Component Value Date   CREATININE 0.69 05/08/2015   BUN 24* 05/08/2015   NA 132* 05/08/2015   K 4.5 05/08/2015   CL 105 05/08/2015   CO2 21* 05/08/2015    Lab Results  Component Value Date   ALT 7* 04/28/2015   AST 8* 04/28/2015   ALKPHOS 173* 04/28/2015   BILITOT 0.4 04/28/2015      Microbiology: Recent Results (from the past 240 hour(s))  Culture, blood (routine x 2)     Status: None   Collection Time: 04/28/15  5:50 PM  Result Value Ref Range Status   Specimen Description BLOOD PICC LINE  Final   Special Requests BOTTLES DRAWN AEROBIC AND ANAEROBIC 5CC EACH  Final   Culture   Final    NO GROWTH 5 DAYS Performed at [facility]    Report Status 05/03/2015 FINAL  Final  Culture, blood (routine x 2)     Status: None (Preliminary result)   Collection Time: 05/04/15  6:30 PM  Result Value Ref Range Status   Specimen Description BLOOD PICC LINE  Final   Special Requests BOTTLES DRAWN AEROBIC AND ANAEROBIC 5ML  Final   Culture   Final    NO GROWTH 4 DAYS Performed at [facility]    Report Status PENDING  Incomplete  Culture, blood (routine x 2)     Status: None (Preliminary result)   Collection Time: 05/04/15  6:30 PM  Result Value Ref Range Status   Specimen Description BLOOD  PICC LINE  Final   Special Requests BOTTLES DRAWN AEROBIC AND ANAEROBIC 5ML  Final   Culture   Final    NO GROWTH 4 DAYS Performed at [facility]    Report Status PENDING  Incomplete  Anaerobic culture     Status: None (Preliminary result)   Collection Time: 05/05/15  1:01 PM  Result Value Ref Range Status   Specimen Description ABSCESS  Final   Special Requests SPEC A ON SWAB RIGHT PELVIC ABSCESS  Final   Gram Stain   Final    FEW WBC PRESENT, PREDOMINANTLY MONONUCLEAR NO SQUAMOUS EPITHELIAL CELLS SEEN FEW GRAM POSITIVE COCCI IN PAIRS IN CLUSTERS Performed at [facility]    Culture   Final    NO ANAEROBES ISOLATED; CULTURE IN PROGRESS FOR 5 DAYS Performed at [facility]    Report Status PENDING  Incomplete  Culture, routine-abscess     Status: None (Preliminary result)   Collection Time: 05/05/15  1:01 PM  Result Value Ref Range Status   Specimen Description ABSCESS  Final   Special Requests SPEC A ON SWAB RIGHT PELVIC ABSCESS  Final   Gram Stain   Final    FEW WBC  PRESENT, PREDOMINANTLY MONONUCLEAR NO SQUAMOUS EPITHELIAL CELLS SEEN FEW GRAM POSITIVE COCCI IN PAIRS IN CLUSTERS Performed at [facility]    Culture   Final    MODERATE STAPHYLOCOCCUS AUREUS Performed at [facility]    Report Status PENDING  Incomplete  Tissue culture     Status: None (Preliminary result)   Collection Time: 05/05/15  1:13 PM  Result Value Ref Range Status   Specimen Description TISSUE  Final   Special Requests SPEC B IN CUP BONE RIGHT ISCHIUM  Final   Gram Stain   Final    RARE WBC PRESENT, PREDOMINANTLY MONONUCLEAR NO ORGANISMS SEEN Performed at [facility]    Culture   Final    FEW STAPHYLOCOCCUS AUREUS Note: RIFAMPIN AND GENTAMICIN SHOULD NOT BE USED AS SINGLE DRUGS FOR TREATMENT OF STAPH INFECTIONS. Performed at [facility]    Report Status PENDING  Incomplete    Studies/Results: Portable Chest Xray  05/08/2015    CLINICAL DATA:  Acute respiratory failure and hypoxia.  Intubation.  EXAM: PORTABLE CHEST - 1 VIEW  COMPARISON:  05/08/2015  FINDINGS: An endotracheal tube with tip 2 cm above the carina, right PICC line with tip overlying the superior cavoatrial junction, and NG tube within the stomach noted.  Airspace opacities throughout the right lung and left lower lung again noted.  Left lower lung consolidation/ atelectasis and bilateral pleural effusions are unchanged.  There is no evidence of pneumothorax.  IMPRESSION: Support apparatus as described. Endotracheal tube tip 2 cm above the carina.  Unchanged bilateral airspace opacities, left lower lung consolidation/atelectasis and bilateral pleural effusions.   Electronically Signed   By: Margarette Canada M.D.   On: 05/08/2015 08:25   Dg Chest Port 1 View  05/08/2015   CLINICAL DATA:  Shortness of breath.  EXAM: PORTABLE CHEST - 1 VIEW  COMPARISON:  Yesterday at 2324 hour  FINDINGS: Tip of the right upper extremity PICC in the SVC. Progressive opacification throughout the right hemithorax, likely related to layering pleural effusion. Left pleural effusion and basilar opacity is unchanged. Decreased esophageal dilatation from prior. Cardiomediastinal contours are unchanged. No pneumothorax.  IMPRESSION: Progressive opacification throughout the right hemithorax, like related to layering of pleural effusion. Left pleural effusion and basilar opacity is unchanged.   Electronically Signed   By: Jeb Levering M.D.   On: 05/08/2015 06:55   Dg Chest Port 1 View  05/07/2015   CLINICAL DATA:  Dyspnea.  EXAM: PORTABLE CHEST - 1 VIEW  COMPARISON:  Earlier this day at 0541 hour  FINDINGS: Tip of the right upper extremity PICC at the atrial caval junction. Increased right pleural effusion from prior exam. Unchanged left pleural effusion. Cardiomediastinal contours are obscured. Presumed gaseous distention of esophagus in the mid thorax. No pneumothorax.  IMPRESSION: 1. Increased  large right pleural effusion. Unchanged large left pleural effusion. 2. Presumed gaseous distention of esophagus in the mid thorax.   Electronically Signed   By: Jeb Levering M.D.   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 Respiratory distress.  EXAM: PORTABLE CHEST - 1 VIEW  COMPARISON:  Plain film 04/25/2015, 03/30/2015, CT 02/20/2015  FINDINGS: Cardiomediastinal silhouette likely unchanged, partially obscured by overlying lung/ pleural disease.  Interval development of dense bilateral lung opacities at the bases. No pneumothorax.  Interval placement of right upper extremity PICC, with the tip  appearing to terminate at the superior vena cava.  IMPRESSION: Interval development of large bilateral pleural effusions with associated atelectasis and/ or consolidation.  Interval placement of right upper extremity PICC, appearing to terminate at the superior vena cava.  Signed,  Dulcy Fanny. Earleen Newport, DO  [facility]  [facility]   Electronically Signed   By: Corrie Mckusick D.O.   On: 05/07/2015 09:07    Assessment/Plan:  1) osteomyelitis - OR debridement 7/28 and abscess culture with Staph aureus.  Blood cultures with MRSA and Strep.   Will continue to monitor cultures Continue vancomycin Dr. Tommy Medal tomorrow  Deklan Minar, Herbie [place], [place] for Infectious Disease [place] [email or web address] [phone] pager   [phone] cell 05/08/2015, 2:28 PM

## 2015-05-08 NOTE — Procedures (Signed)
Bedside Bronchoscopy Procedure Note YORDIN RHODA [id number] 04-15-1986  Procedure: Bronchoscopy Indications: Obtain specimens for culture and/or other diagnostic studies  Procedure Details: ET Tube Size: ET Tube secured at lip (cm): Bite block in place: Yes In preparation for procedure, Patient hyper-oxygenated with 100 % FiO2 Airway entered and the following bronchi were examined: RUL.   Patient placed back on 100% FiO2 at conclusion of procedure.    Evaluation BP 75/33 mmHg  Pulse 73  Temp(Src) 95.9 F (35.5 C) (Oral)  Resp 12  Ht 5\' 8"  (1.727 m)  Wt 159 lb 13.3 oz (72.5 kg)  BMI 24.31 kg/m2  SpO2 95% Breath Sounds:Diminished O2 sats: stable throughout Patient's Current Condition: stable Specimens:  Sent purulent fluid Complications: No apparent complications Patient did tolerate procedure well.   Pt bronched by Dr. Lake Bells.  Specimen obtained and sent to lab.  Pt tolerated well, vital signs stable.  RT will continue to monitor. Pierre Bali 05/08/2015, 8:25 AM

## 2015-05-08 NOTE — Progress Notes (Signed)
ANTIBIOTIC CONSULT NOTE - Follow Up  Pharmacy Consult for Vancomycin, Aztreonam Indication: MRSA bacteremia, HCAP, UTI, osteomyelitis  Allergies  Allergen Reactions  . Cefuroxime Axetil Anaphylaxis  . Ertapenem Other (See Comments)    Rash and confusion  . Morphine And Related Other (See Comments)    Changed mental status, confusion, headache, visual hallucination  . Penicillins Anaphylaxis and Other (See Comments)    ?can take amoxicillin?  . Sulfa Antibiotics Anaphylaxis, Shortness Of Breath and Other (See Comments)  . Tessalon [Benzonatate] Anaphylaxis  . Shellfish Allergy Itching and Other (See Comments)    Took benadryl to alleviate reaction  . Miripirium Rash and Other (See Comments)    Change in mental status    Patient Measurements: Height: 5\' 8"  (172.7 cm) Weight: 159 lb 13.3 oz (72.5 kg) IBW/kg (Calculated) : 68.4  Vital Signs: Temp: 94.3 F (34.6 C) (07/31 0829) Temp Source: Axillary (07/31 0829) BP: 75/33 mmHg (07/31 0700) Pulse Rate: 73 (07/31 0700) Intake/Output from previous day: 07/30 0701 - 07/31 0700 In: 1961.6 [P.O.:575; I.V.:568.6; Blood:568; IV Piggyback:250] Out: 1300 [Urine:700; Stool:600] Intake/Output from this shift:    Labs:  Recent Labs  05/06/15 0430  05/07/15 0955 05/07/15 1210 05/08/15 0403 05/08/15 0800  WBC 50.0*  < > 56.6*  --  54.2* 48.3*  HGB 11.1*  < > 8.6*  --  6.6* 9.4*  PLT 317  < > 258  --  246 233  CREATININE 0.57*  --   --  0.71 0.69  --   < > = values in this interval not displayed. Estimated Creatinine Clearance: 130.6 mL/min (by C-G formula based on Cr of 0.69).  Recent Labs  05/05/15 2120  [facility]     Microbiology: Recent Results (from the past 720 hour(s))  Urine culture     Status: None   Collection Time: 04/25/15  4:35 PM  Result Value Ref Range Status   Specimen Description URINE, CLEAN CATCH  Final   Special Requests Normal  Final   Culture   Final    >=100,000 COLONIES/mL ESCHERICHIA  COLI AZTREONAM SENSITIVE ZONE 32MM Performed at [facility] Performed at [facility]    Report Status 05/02/2015 FINAL  Final   Organism ID, Bacteria ESCHERICHIA COLI  Final      Susceptibility   Escherichia coli - MIC*    AMPICILLIN <=2 SENSITIVE Sensitive     CEFAZOLIN <=4 SENSITIVE Sensitive     CEFTRIAXONE <=1 SENSITIVE Sensitive     CIPROFLOXACIN >=4 RESISTANT Resistant     GENTAMICIN <=1 SENSITIVE Sensitive     IMIPENEM <=0.25 SENSITIVE Sensitive     NITROFURANTOIN <=16 SENSITIVE Sensitive     TRIMETH/SULFA <=20 SENSITIVE Sensitive     AMPICILLIN/SULBACTAM <=2 SENSITIVE Sensitive     PIP/TAZO <=4 SENSITIVE Sensitive     * >=100,000 COLONIES/mL ESCHERICHIA COLI  Blood culture (routine x 2)     Status: None   Collection Time: 04/25/15  5:00 PM  Result Value Ref Range Status   Specimen Description BLOOD RIGHT ANTECUBITAL  Final   Special Requests BOTTLES DRAWN AEROBIC AND ANAEROBIC 5 CC EA  Final   Culture  Setup Time   Final    GRAM POSITIVE COCCI IN CLUSTERS Gram Stain Report Called to,Read Back By and Verified With: Elveria Rising RN 17:30 04/26/15 ([email or web address]) IN BOTH AEROBIC AND ANAEROBIC BOTTLES    Culture   Final    METHICILLIN RESISTANT STAPHYLOCOCCUS AUREUS MICROAEROPHILIC STREPTOCOCCI Standardized susceptibility testing for this organism is  not available. Performed at [facility]    Report Status 04/30/2015 FINAL  Final   Organism ID, Bacteria METHICILLIN RESISTANT STAPHYLOCOCCUS AUREUS  Final      Susceptibility   Methicillin resistant staphylococcus aureus - MIC*    CIPROFLOXACIN >=8 RESISTANT Resistant     ERYTHROMYCIN >=8 RESISTANT Resistant     GENTAMICIN <=0.5 SENSITIVE Sensitive     OXACILLIN >=4 RESISTANT Resistant     TETRACYCLINE <=1 SENSITIVE Sensitive     VANCOMYCIN <=0.5 SENSITIVE Sensitive     TRIMETH/SULFA >=320 RESISTANT Resistant     CLINDAMYCIN >=8 RESISTANT Resistant     RIFAMPIN <=0.5 SENSITIVE Sensitive     Inducible  Clindamycin NEGATIVE Sensitive     * METHICILLIN RESISTANT STAPHYLOCOCCUS AUREUS  Blood culture (routine x 2)     Status: None   Collection Time: 04/25/15  5:16 PM  Result Value Ref Range Status   Specimen Description BLOOD BLOOD RIGHT FOREARM  Final   Special Requests BOTTLES DRAWN AEROBIC AND ANAEROBIC 5 CC EA  Final   Culture  Setup Time   Final    GRAM POSITIVE COCCI IN CLUSTERS CRITICAL RESULT CALLED TO, READ BACK BY AND VERIFIED WITH: S GRAHAM 04/26/15 @ 43 M VESTAL IN BOTH AEROBIC AND ANAEROBIC BOTTLES    Culture   Final    METHICILLIN RESISTANT STAPHYLOCOCCUS AUREUS SUSCEPTIBILITIES PERFORMED ON PREVIOUS CULTURE WITHIN THE LAST 5 DAYS. MICROAEROPHILIC STREPTOCOCCI Standardized susceptibility testing for this organism is not available. Performed at [facility]    Report Status 05/03/2015 FINAL  Final  MRSA PCR Screening     Status: Abnormal   Collection Time: 04/25/15  6:55 PM  Result Value Ref Range Status   MRSA by PCR POSITIVE (A) NEGATIVE Final    Comment:        The GeneXpert MRSA Assay (FDA approved for NASAL specimens only), is one component of a comprehensive MRSA colonization surveillance program. It is not intended to diagnose MRSA infection nor to guide or monitor treatment for MRSA infections. RESULT CALLED TO, READ BACK BY AND VERIFIED WITH: Sharyn Lull REEVES,RN [id number] @ 2043 BY J SCOTTON   Culture, blood (routine x 2)     Status: None   Collection Time: 04/28/15  1:20 PM  Result Value Ref Range Status   Specimen Description BLOOD LEFT ARM  Final   Special Requests IN PEDIATRIC BOTTLE 1CC  Final   Culture  Setup Time   Final    GRAM POSITIVE COCCI IN CLUSTERS AEROBIC BOTTLE ONLY CRITICAL RESULT CALLED TO, READ BACK BY AND VERIFIED WITH: Hetty Blend RN 2222 04/30/15 A BROWNING    Culture   Final    MICROAEROPHILIC STREPTOCOCCI Standardized susceptibility testing for this organism is not available. Performed at [facility]    Report  Status 05/03/2015 FINAL  Final  Culture, blood (routine x 2)     Status: None   Collection Time: 04/28/15  5:50 PM  Result Value Ref Range Status   Specimen Description BLOOD PICC LINE  Final   Special Requests BOTTLES DRAWN AEROBIC AND ANAEROBIC 5CC EACH  Final   Culture   Final    NO GROWTH 5 DAYS Performed at [facility]    Report Status 05/03/2015 FINAL  Final  Culture, blood (routine x 2)     Status: None (Preliminary result)   Collection Time: 05/04/15  6:30 PM  Result Value Ref Range Status   Specimen Description BLOOD PICC LINE  Final  Special Requests BOTTLES DRAWN AEROBIC AND ANAEROBIC 5ML  Final   Culture   Final    NO GROWTH 3 DAYS Performed at [facility]    Report Status PENDING  Incomplete  Culture, blood (routine x 2)     Status: None (Preliminary result)   Collection Time: 05/04/15  6:30 PM  Result Value Ref Range Status   Specimen Description BLOOD PICC LINE  Final   Special Requests BOTTLES DRAWN AEROBIC AND ANAEROBIC 5ML  Final   Culture   Final    NO GROWTH 3 DAYS Performed at [facility]    Report Status PENDING  Incomplete  Anaerobic culture     Status: None (Preliminary result)   Collection Time: 05/05/15  1:01 PM  Result Value Ref Range Status   Specimen Description ABSCESS  Final   Special Requests SPEC A ON SWAB RIGHT PELVIC ABSCESS  Final   Gram Stain   Final    FEW WBC PRESENT, PREDOMINANTLY MONONUCLEAR NO SQUAMOUS EPITHELIAL CELLS SEEN FEW GRAM POSITIVE COCCI IN PAIRS IN CLUSTERS Performed at [facility]    Culture   Final    NO ANAEROBES ISOLATED; CULTURE IN PROGRESS FOR 5 DAYS Performed at [facility]    Report Status PENDING  Incomplete  Culture, routine-abscess     Status: None (Preliminary result)   Collection Time: 05/05/15  1:01 PM  Result Value Ref Range Status   Specimen Description ABSCESS  Final   Special Requests SPEC A ON SWAB RIGHT PELVIC ABSCESS  Final   Gram Stain   Final     FEW WBC PRESENT, PREDOMINANTLY MONONUCLEAR NO SQUAMOUS EPITHELIAL CELLS SEEN FEW GRAM POSITIVE COCCI IN PAIRS IN CLUSTERS Performed at [facility]    Culture   Final    MODERATE STAPHYLOCOCCUS AUREUS Performed at [facility]    Report Status PENDING  Incomplete  Tissue culture     Status: None (Preliminary result)   Collection Time: 05/05/15  1:13 PM  Result Value Ref Range Status   Specimen Description TISSUE  Final   Special Requests SPEC B IN CUP BONE RIGHT ISCHIUM  Final   Gram Stain   Final    RARE WBC PRESENT, PREDOMINANTLY MONONUCLEAR NO ORGANISMS SEEN Performed at [facility]    Culture   Final    FEW STAPHYLOCOCCUS AUREUS Note: RIFAMPIN AND GENTAMICIN SHOULD NOT BE USED AS SINGLE DRUGS FOR TREATMENT OF STAPH INFECTIONS. Performed at [facility]    Report Status PENDING  Incomplete    Assessment: 29 yo male on Vancomycin and Zosyn for MRSA bacteremia, HCAP, R internal obturator abscess, and Ecoli UTI. S/p I&D of abscess 7/28.  Anaphylaxis to PCNs/Cephs; rash to Ertapenem. ID following patient.  Scr remains stable.  7/23 TTE could not exclude vegetation or mass in R atrium 7/28 TEE = no vegetations  7/18 >> Vancomycin >> 7/18 >> Aztreonam >>  7/18 blood: 2 of 2 MRSA, microaerophilic strep 1/15 urine: >100k E.coli (pansensitive except Cipro; sensitive to aztreonam)  7/18 MRSA PCR: positive 7/21 blood: 1/2 microaerophilic strep 7/26 blood: neg  7/27 1830 Vancomycin trough = 27 mcg/ml 7/28 2120 Vancomycin random = 16 mcg/ml Using the above 2 levels, calculated ke = 0.020 and calculated half-life = 34 hr  Goal of Therapy:  Vancomycin trough level 15-20 mcg/ml  Appropriate antibiotic dosing for renal function; eradication of infection  Plan:  - Continue Vancomycin to 750mg  IV q48h- will consider checking trough prior  to tomorrow night's dose - Continue Aztreonam 1g q8h  - Will continue to f/u renal function, micro data, pt's  clinical condition  Uvaldo Rising, BCPS  Clinical Pharmacist Pager [phone]  05/08/2015 9:24 AM

## 2015-05-08 NOTE — Progress Notes (Signed)
PULMONARY / CRITICAL CARE MEDICINE   Name: Jason [name] MRN: [id number] DOB: 05/05/1986    ADMISSION DATE: 04/25/2015 CONSULTATION DATE: 05/01/2015  REFERRING MD : TRH  CHIEF COMPLAINT: Hypotension  INITIAL PRESENTATION: 29 year old male quad with MRSA bacteremia from multiple sources. Concern for endocarditis. Wounds have been checked and followed by general surgery and ortho. Patient has history of hypotension and concern for septic shock and levophed was started and PCCM consulted.  SIGNIFICANT EVENTS:  7/28>> Reconsulted for extensive post op bleeding and hypotension 7/31 >> worsening respiratory status, acidosis, hypoxemia, hypotension overnight  SUBJECTIVE:  worsening respiratory status, acidosis, hypoxemia, hypotension overnight  VITAL SIGNS: Temp:  [94.3 F (34.6 C)-97.5 F (36.4 C)] 95.9 F (35.5 C) (07/31 0651) Pulse Rate:  [62-86] 73 (07/31 0700) Resp:  [8-33] 12 (07/31 0700) BP: (65-144)/(33-96) 75/33 mmHg (07/31 0700) SpO2:  [49 %-100 %] 95 % (07/31 0700) HEMODYNAMICS:   VENTILATOR SETTINGS:   INTAKE / OUTPUT:  Intake/Output Summary (Last 24 hours) at 05/08/15 0738 Last data filed at 05/08/15 0700  Gross per 24 hour  Intake 1961.64 ml  Output   1300 ml  Net 661.64 ml    PHYSICAL EXAMINATION:  Gen: chronically ill appearing, markedly increased work of breathing, appears to be tiring out HENT: OP clear, NCAT, MMM dry PULM: Diminished bases, loud rhonchi CV: RRR, no mgr, edema arms, feet GI: BS+, soft, nontender Derm: chronic breakdown, sacral wound well dressd Neuro: Sonorous, minimal response to external stimuli, sternal rub   LABS:  CBC  Recent Labs Lab 05/07/15 0207 05/07/15 0955 05/08/15 0403  WBC 49.8* 56.6* 54.2*  HGB 6.8* 8.6* 6.6*  HCT 19.3* 23.9* 18.5*  PLT 304 258 246   Coag's  Recent Labs Lab 05/06/15 1600  INR 1.80*   BMET  Recent Labs Lab 05/06/15 0430 05/07/15 1210 05/08/15 0403  NA 132* 131* 132*   K 3.6 4.4 4.5  CL 102 109 105  CO2 27 19* 21*  BUN 15 22* 24*  CREATININE 0.57* 0.71 0.69  GLUCOSE 424* 75 129*   Electrolytes  Recent Labs Lab 05/02/15 0536  05/06/15 0430 05/07/15 0212 05/07/15 1210 05/08/15 0403  CALCIUM 7.2*  < > 6.6*  --  7.0* 7.2*  MG 1.1*  < > 1.5* 1.8  --  2.1  PHOS 3.2  --  3.1  --   --   --   < > = values in this interval not displayed. Sepsis Markers No results for input(s): LATICACIDVEN, PROCALCITON, O2SATVEN in the last 168 hours. ABG  Recent Labs Lab 05/05/15 1658 05/08/15 0624  PHART 7.251* 7.275*  PCO2ART 36.7 39.5  PO2ART 118.0* 61.0*   Liver Enzymes No results for input(s): AST, ALT, ALKPHOS, BILITOT, ALBUMIN in the last 168 hours. Cardiac Enzymes No results for input(s): TROPONINI, PROBNP in the last 168 hours. Glucose  Recent Labs Lab 05/07/15 1218 05/07/15 1219 05/07/15 1553 05/07/15 1948 05/07/15 2231 05/08/15 0602  GLUCAP 57* 71 80 117* 122* 113*    Imaging 7/31 CXR > worsening bibasilar infiltrates, RLL collapse; compared to two weeks ago markedly worse   ASSESSMENT / PLAN:  PULMONARY A:  Acute hypoxemia respiratory failure > due to pulm edema vs HCAP with atelectasis > currently not compensating for metabolic acidosis; consider TRALI if no improvement with positive pressure Pleural effusion R base, small P:  Intubate now  Full vent support Pulm toilette> bag lavage q shift PAD protocol for vent syncrhony  CARDIOVASCULAR Right Arm PICC 7/19>>> A:  Shock- septic, complicated by slow oozing from sacral wound; improved after hydrocortisone but worsening this morning likely due to acidosis Chronic hypotension TEE 7/28>>> NO vegetation, normal LV function  P:  Continue hydrocortisone Continue Midodrine 20mg  PO TID Neo for MAP > 65  RENAL A:  Hyponatremia >mild P:  Monitor BMET and UOP Replace electrolytes as needed  GASTROINTESTINAL A:  No acute issues  P:  Start Tube  Feedings Place OG tube Zofran PRN nausea PPI 40 qD  HEMATOLOGIC A:  Anemia due to oozing from sacral wound Peri-operative bleeding - large ischial wound s/p debridement  P:  Trend CBC, transfuse for hgb < 7 (again 7/31) SCD  INFECTIOUS A: MRSA bacteremia, neg TEE E coli UTI  Possible osteomyelitis  P:  BCx2 7/18 >  MRSA Wound/tissue culture 7/28>>> Abscess culture 7/28>>>  Vancomycin 7/18>>> Aztreonam 7/18>>> 7/30  ENDOCRINE A:  Possible adrenal insufficiency  T1 DM  P:  SSI, home lantus  Continue hydrocortisone  NEUROLOGIC A: Quad post trauma Post op pain  Acute encephalopathy 7/31 > multifactorial, due to sepsis, anemia, hypotension, pain medications P:  RASS goal -1 PRN fentanyl   CC time 40 minutes  Roselie Awkward, MD [place] PCCM Pager: [phone] Cell: [phone] After 3pm or if no response, call [phone]     05/08/2015 7:38 AM

## 2015-05-08 NOTE — Progress Notes (Signed)
CRITICAL VALUE ALERT  Critical value received:  Hemoglobin 6.6, WBC 54.2  Date of notification:  05/08/15  Time of notification:  4:40  Critical value read back:Yes.    Nurse who received alert:  Kerry Fort, RN   MD notified (1st page):  E-link, Dr. Tamala Julian  Time of first page:  4:40  MD notified (2nd page):  Time of second page:  Responding MD:  Dr. Tamala Julian  Time MD responded:  4:40

## 2015-05-08 NOTE — Procedures (Signed)
Intubation Procedure Note Jason [name] [id number] 11-May-1986  Procedure: Intubation Indications: Airway protection and maintenance  Procedure Details Consent: Unable to obtain consent because of emergent medical necessity. Time Out: Verified patient identification, verified procedure, site/side was marked, verified correct patient position, special equipment/implants available, medications/allergies/relevent history reviewed, required imaging and test results available.  Performed  Drugs Etomidate 10mg , 1mg  versed, Fentanyl 163mcg DL x 1 with GS 3 blade Grade 1 view 8.0 ET tube passed through cords under direct visualization > frank pus came from larynx on intubation Placement confirmed with bilateral breath sounds, positive EtCO2 change and smoke in tube   Evaluation Hemodynamic Status: BP stable throughout; O2 sats: stable throughout Patient's Current Condition: stable Complications: No apparent complications Patient did tolerate procedure well. Chest X-ray ordered to verify placement.  CXR: pending.   Jason [name] 05/08/2015

## 2015-05-08 NOTE — Progress Notes (Signed)
eLink Physician-Brief Progress Note Patient Name: Jason [name] DOB: 1986-03-28 MRN: [id number]   Date of Service  05/08/2015  HPI/Events of Note  cxr shows evidence of volume overload and lobar collapse possibly related to effusion and secretions.  eICU Interventions  Dose of lasix Nebs with CPT every 4 hrs     Intervention Category Major Interventions: Hypoxemia - evaluation and management  Mauri Brooklyn, P 05/08/2015, 12:12 AM

## 2015-05-09 ENCOUNTER — Inpatient Hospital Stay (HOSPITAL_COMMUNITY): Payer: Medicaid Other

## 2015-05-09 DIAGNOSIS — R7881 Bacteremia: Secondary | ICD-10-CM | POA: Insufficient documentation

## 2015-05-09 DIAGNOSIS — J189 Pneumonia, unspecified organism: Secondary | ICD-10-CM

## 2015-05-09 DIAGNOSIS — M4628 Osteomyelitis of vertebra, sacral and sacrococcygeal region: Secondary | ICD-10-CM

## 2015-05-09 DIAGNOSIS — Z9911 Dependence on respirator [ventilator] status: Secondary | ICD-10-CM

## 2015-05-09 DIAGNOSIS — Y95 Nosocomial condition: Secondary | ICD-10-CM

## 2015-05-09 DIAGNOSIS — B9562 Methicillin resistant Staphylococcus aureus infection as the cause of diseases classified elsewhere: Secondary | ICD-10-CM | POA: Insufficient documentation

## 2015-05-09 DIAGNOSIS — Z930 Tracheostomy status: Secondary | ICD-10-CM

## 2015-05-09 LAB — TYPE AND SCREEN
ABO/RH(D): O POS
ABO/RH(D): O POS
ANTIBODY SCREEN: NEGATIVE
Antibody Screen: NEGATIVE
UNIT DIVISION: 0
UNIT DIVISION: 0
Unit division: 0
Unit division: 0
Unit division: 0
Unit division: 0
Unit division: 0
Unit division: 0
Unit division: 0

## 2015-05-09 LAB — CBC WITH DIFFERENTIAL/PLATELET
BASOS PCT: 0 % (ref 0–1)
Basophils Absolute: 0 10*3/uL (ref 0.0–0.1)
EOS ABS: 0 10*3/uL (ref 0.0–0.7)
Eosinophils Relative: 0 % (ref 0–5)
HCT: 22.1 % — ABNORMAL LOW (ref 39.0–52.0)
Hemoglobin: 8 g/dL — ABNORMAL LOW (ref 13.0–17.0)
LYMPHS ABS: 1 10*3/uL (ref 0.7–4.0)
Lymphocytes Relative: 2 % — ABNORMAL LOW (ref 12–46)
MCH: 30.2 pg (ref 26.0–34.0)
MCHC: 36.2 g/dL — ABNORMAL HIGH (ref 30.0–36.0)
MCV: 83.4 fL (ref 78.0–100.0)
MONO ABS: 1 10*3/uL (ref 0.1–1.0)
MONOS PCT: 2 % — AB (ref 3–12)
NEUTROS ABS: 46.3 10*3/uL — AB (ref 1.7–7.7)
Neutrophils Relative %: 96 % — ABNORMAL HIGH (ref 43–77)
Platelets: 284 10*3/uL (ref 150–400)
RBC: 2.65 MIL/uL — AB (ref 4.22–5.81)
RDW: 15.5 % (ref 11.5–15.5)
WBC Morphology: INCREASED
WBC: 48.3 10*3/uL — AB (ref 4.0–10.5)

## 2015-05-09 LAB — TISSUE CULTURE

## 2015-05-09 LAB — BASIC METABOLIC PANEL
ANION GAP: 3 — AB (ref 5–15)
BUN: 24 mg/dL — ABNORMAL HIGH (ref 6–20)
CHLORIDE: 110 mmol/L (ref 101–111)
CO2: 18 mmol/L — ABNORMAL LOW (ref 22–32)
Calcium: 7.4 mg/dL — ABNORMAL LOW (ref 8.9–10.3)
Creatinine, Ser: 0.77 mg/dL (ref 0.61–1.24)
GFR calc Af Amer: >60 mL/min (ref >60)
GFR calc non Af Amer: >60 mL/min (ref >60)
Glucose, Bld: 91 mg/dL (ref 65–99)
Potassium: 4 mmol/L (ref 3.5–5.1)
SODIUM: 131 mmol/L — AB (ref 135–145)

## 2015-05-09 LAB — PHOSPHORUS
Phosphorus: 3.5 mg/dL (ref 2.5–4.6)
Phosphorus: 3.5 mg/dL (ref 2.5–4.6)

## 2015-05-09 LAB — GLUCOSE, CAPILLARY
GLUCOSE-CAPILLARY: 56 mg/dL — AB (ref 65–99)
GLUCOSE-CAPILLARY: 83 mg/dL (ref 65–99)
Glucose-Capillary: 78 mg/dL (ref 65–99)
Glucose-Capillary: 91 mg/dL (ref 65–99)
Glucose-Capillary: 91 mg/dL (ref 65–99)
Glucose-Capillary: 97 mg/dL (ref 65–99)

## 2015-05-09 LAB — CULTURE, ROUTINE-ABSCESS

## 2015-05-09 LAB — MAGNESIUM
MAGNESIUM: 2.1 mg/dL (ref 1.7–2.4)
MAGNESIUM: 2 mg/dL (ref 1.7–2.4)
Magnesium: 1.9 mg/dL (ref 1.7–2.4)

## 2015-05-09 LAB — CULTURE, BLOOD (ROUTINE X 2)
CULTURE: NO GROWTH
Culture: NO GROWTH

## 2015-05-09 MED ORDER — CIPROFLOXACIN IN D5W 400 MG/200ML IV SOLN
400.0000 mg | Freq: Two times a day (BID) | INTRAVENOUS | Status: DC
  Administered 2015-05-09 – 2015-05-10 (×4): 400 mg via INTRAVENOUS
  Filled 2015-05-09 (×6): qty 200

## 2015-05-09 MED ORDER — VITAL HIGH PROTEIN PO LIQD
1000.0000 mL | ORAL | Status: DC
  Filled 2015-05-09 (×3): qty 1000

## 2015-05-09 MED ORDER — PRO-STAT SUGAR FREE PO LIQD
30.0000 mL | Freq: Two times a day (BID) | ORAL | Status: AC
  Administered 2015-05-09: 30 mL
  Filled 2015-05-09: qty 30

## 2015-05-09 MED ORDER — PRO-STAT SUGAR FREE PO LIQD
30.0000 mL | Freq: Two times a day (BID) | ORAL | Status: DC
  Administered 2015-05-09: 30 mL
  Filled 2015-05-09 (×2): qty 30

## 2015-05-09 MED ORDER — SODIUM CHLORIDE 0.45 % IV SOLN
INTRAVENOUS | Status: DC
  Administered 2015-05-09: 10:00:00 via INTRAVENOUS
  Administered 2015-05-10: 50 mL/h via INTRAVENOUS
  Administered 2015-05-11: 05:00:00 via INTRAVENOUS

## 2015-05-09 MED ORDER — MIDAZOLAM HCL 2 MG/2ML IJ SOLN
2.0000 mg | INTRAMUSCULAR | Status: DC | PRN
  Administered 2015-05-09: 2 mg via INTRAVENOUS
  Filled 2015-05-09: qty 2

## 2015-05-09 MED ORDER — DEXTROSE 50 % IV SOLN
INTRAVENOUS | Status: AC
  Administered 2015-05-09: 25 mL
  Filled 2015-05-09: qty 50

## 2015-05-09 MED ORDER — SODIUM CHLORIDE 0.9 % IV SOLN
25.0000 ug/h | INTRAVENOUS | Status: DC
  Administered 2015-05-09: 25 ug/h via INTRAVENOUS
  Administered 2015-05-10 – 2015-05-12 (×2): 75 ug/h via INTRAVENOUS
  Administered 2015-05-13: 150 ug/h via INTRAVENOUS
  Filled 2015-05-09 (×4): qty 50

## 2015-05-09 MED ORDER — VITAL AF 1.2 CAL PO LIQD
1000.0000 mL | ORAL | Status: DC
  Administered 2015-05-09 – 2015-05-17 (×6): 1000 mL
  Filled 2015-05-09 (×16): qty 1000

## 2015-05-09 MED ORDER — FENTANYL CITRATE (PF) 100 MCG/2ML IJ SOLN
50.0000 ug | Freq: Once | INTRAMUSCULAR | Status: AC
  Administered 2015-05-09: 50 ug via INTRAVENOUS

## 2015-05-09 MED ORDER — MIDAZOLAM HCL 2 MG/2ML IJ SOLN
2.0000 mg | INTRAMUSCULAR | Status: DC | PRN
  Administered 2015-05-09 – 2015-05-15 (×6): 2 mg via INTRAVENOUS
  Filled 2015-05-09 (×3): qty 2

## 2015-05-09 MED ORDER — FENTANYL BOLUS VIA INFUSION
50.0000 ug | INTRAVENOUS | Status: DC | PRN
  Filled 2015-05-09: qty 50

## 2015-05-09 NOTE — Progress Notes (Signed)
PULMONARY / CRITICAL CARE MEDICINE   Name: Jason [name] MRN: [id number] DOB: 31-Jul-1986    ADMISSION DATE: 04/25/2015 CONSULTATION DATE: 05/01/2015  REFERRING MD : TRH  CHIEF COMPLAINT: Hypotension  INITIAL PRESENTATION: 29 year old male quad with MRSA bacteremia from multiple sources. Concern for endocarditis. Wounds have been checked and followed by general surgery and ortho. Patient has history of hypotension and concern for septic shock and levophed was started and PCCM consulted.  SIGNIFICANT EVENTS:  7/28>> Reconsulted for extensive post op bleeding and hypotension 7/31 >> worsening respiratory status, acidosis, hypoxemia, hypotension overnight 7/31- bronch required  SUBJECTIVE: remains vented, bronch was done, weaning  VITAL SIGNS: Temp:  [97.5 F (36.4 C)-98.9 F (37.2 C)] 98 F (36.7 C) (08/01 0800) Pulse Rate:  [85-110] 96 (08/01 0800) Resp:  [10-26] 10 (08/01 0800) BP: (68-179)/(40-157) 79/46 mmHg (08/01 0800) SpO2:  [90 %-100 %] 95 % (08/01 0800) FiO2 (%):  [45 %-80 %] 45 % (08/01 0722) Weight:  [72.938 kg (160 lb 12.8 oz)] 72.938 kg (160 lb 12.8 oz) (08/01 0300) HEMODYNAMICS:   VENTILATOR SETTINGS: Vent Mode:  [-] PSV;CPAP FiO2 (%):  [45 %-80 %] 45 % Set Rate:  [20 bmp] 20 bmp Vt Set:  [550 mL] 550 mL PEEP:  [5 cmH20] 5 cmH20 Pressure Support:  [15 cmH20] 15 cmH20 Plateau Pressure:  [22 cmH20-28 cmH20] 25 cmH20 INTAKE / OUTPUT:  Intake/Output Summary (Last 24 hours) at 05/09/15 0850 Last data filed at 05/09/15 0800  Gross per 24 hour  Intake 875.06 ml  Output   1043 ml  Net -167.94 ml    PHYSICAL EXAMINATION:  Gen: chronically ill appearing, rass 1 HENT: ett PULM: Diminished bases, less ronchi CV: RRR, no mgr, edema arms, feet GI: BS+, soft, nontender Derm: chronic breakdown, sacral wound , plastics evaluating Neuro: awake follows commands   LABS:  CBC  Recent Labs Lab 05/08/15 0403 05/08/15 0800 05/09/15 0556  WBC 54.2*  48.3* 48.3*  HGB 6.6* 9.4* 8.0*  HCT 18.5* 26.6* 22.1*  PLT 246 233 284   Coag's  Recent Labs Lab 05/06/15 1600  INR 1.80*   BMET  Recent Labs Lab 05/07/15 1210 05/08/15 0403 05/09/15 0556  NA 131* 132* 131*  K 4.4 4.5 4.0  CL 109 105 110  CO2 19* 21* 18*  BUN 22* 24* 24*  CREATININE 0.71 0.69 0.77  GLUCOSE 75 129* 91   Electrolytes  Recent Labs Lab 05/06/15 0430 05/07/15 0212 05/07/15 1210 05/08/15 0403 05/09/15 0556  CALCIUM 6.6*  --  7.0* 7.2* 7.4*  MG 1.5* 1.8  --  2.1 2.1  PHOS 3.1  --   --   --   --    Sepsis Markers  Recent Labs Lab 05/08/15 0847 05/08/15 1040  LATICACIDVEN 1.4 2.0   ABG  Recent Labs Lab 05/05/15 1658 05/08/15 0624 05/08/15 0856  PHART 7.251* 7.275* 7.385  PCO2ART 36.7 39.5 31.5*  PO2ART 118.0* 61.0* 156.0*   Liver Enzymes No results for input(s): AST, ALT, ALKPHOS, BILITOT, ALBUMIN in the last 168 hours. Cardiac Enzymes No results for input(s): TROPONINI, PROBNP in the last 168 hours. Glucose  Recent Labs Lab 05/08/15 1145 05/08/15 1618 05/08/15 2011 05/08/15 2343 05/09/15 0355 05/09/15 0753  GLUCAP 126* 150* 98 91 56* 78    Imaging 7/31 CXR > worsening bibasilar infiltrates, RLL collapse; compared to two weeks ago markedly worse   ASSESSMENT / PLAN:  PULMONARY A:  Acute hypoxemia respiratory failure > due to pulm edema vs HCAP  with atelectasis > currently not compensating for metabolic acidosis; consider TRALI if no improvement with positive pressure Pleural effusion R base, small Collapse rt base RML, RLL< improved post bronch P:  ABg reviewed, keep same MV Weaning now cpap 5 ps5, goal 2 hr Concern is need trach long term, will d/w pt pcxr in am  Chest pt  CARDIOVASCULAR Right Arm PICC 7/19>>> A:  Shock- septic, complicated by slow oozing from sacral wound; improved after hydrocortisone but worsening this morning likely due to acidosis Chronic hypotension TEE 7/28>>> NO vegetation, normal  LV function  Rel AI P:  Continue hydrocortisone as remains on pressors Continue Midodrine 57m PO TID Neo for MAP > 55, hope to dc today Assess cvp  RENAL A:  Hyponatremia >mild NONAG met acidosis, secondary to saline P:  Dc any saline 1/2 NS at 50   GASTROINTESTINAL A:  No acute issues  P:  Start Tube Feedings today as unlikely to extubate Place OG tube Zofran PRN nausea PPI 40 qD  HEMATOLOGIC A:  Anemia due to oozing from sacral wound Peri-operative bleeding - large ischial wound s/p debridement  P:  Trend CBC, transfuse for hgb < 7 (again 7/31) SCD No bleeding wound significant  INFECTIOUS A: MRSA bacteremia, neg TEE E coli UTI  Possible osteomyelitis  P:  BCx2 7/18 >  MRSA Wound/tissue culture 7/28>>> Abscess culture 7/28>>> Bronch bal 7/31>>>  Vancomycin 7/18>>> Aztreonam 7/18>>> 8/1 cipro>>>  Can we consider more aggressive gram neg coverage for  PNA Add ciprofloxacin  ENDOCRINE A:  Possible adrenal insufficiency  T1 DM hypoglycemia P:  SSI, dc lantus  Continue hydrocortisone  NEUROLOGIC A: Quad post trauma Post op pain  Acute encephalopathy 7/31 > multifactorial, due to sepsis, anemia, hypotension, pain medications P:  RASS goal -1 PRN fentanyl   CC time 40 minutes  DLavon Paganini FTitus Mould MD, FWilliamstownPgr: 3[facility]     05/09/2015 8:50 AM

## 2015-05-09 NOTE — Progress Notes (Signed)
New order for [facility] placed by Dr. Tommy Medal with ID. Lab assessed pt's arm and leg but says too much edema on board to get good sample. Talked with CCM Gretchen RN and Dr. Joya Gaskins and will reschedule for tomorrow in order to clarify order with ID.

## 2015-05-09 NOTE — Progress Notes (Signed)
Nutrition Follow Up  DOCUMENTATION CODES:    Severe malnutrition in context of acute illness/injury, Underweight  INTERVENTION:  Discontinue Vital High Protein formula.  Initiate TF via OGT with Vital AF 1.2 formula at 25 ml/h and Prostat 30 ml BID on day 1; on day 2, d/c prostat and increase formula  to goal rate of 60 ml/h (1440 ml per day) to provide 1728 kcals, 108 gm protein, 1166 ml free water daily.  RD to continue to monitor.   NUTRITION DIAGNOSIS:   Increased nutrient needs related to wound healing as evidenced by estimated needs; ongoing  GOAL:   Patient will meet greater than or equal to 90% of their needs; progressing  MONITOR:   PO intake, Supplement acceptance, Weight trends, Labs  REASON FOR ASSESSMENT:   Malnutrition Screening Tool    ASSESSMENT:   29 year old male who has a past medical history of GERD (gastroesophageal reflux disease); Asthma; MRSA infection; Gastroparesis; Diabetic neuropathy; Seizures; Family history of anesthesia complication; Dysrhythmia; Pneumonia; Arthritis; Fibromyalgia; Stroke, Type I diabetes mellitus; and Syncope.   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 Patient was sent to the emergency room from the physical therapy office today after patient was found to be hypotensive. [name] says that he has not been eating and drinking well for past twoweeks. In the ED patient was found to be dehydrated, had white count of 20,000, chest x-ray showing pneumonia as well as abnormal UA consistent with UTI.  Patient s/p procedure 7/28: IRRIGATION AND DEBRIDEMENT SACRAL ULCER - PLACEMENT OF ACELL AND VAC  Patient is currently intubated on ventilator support MV: 10.9 L/min Temp (24hrs), Avg:98.1 F (36.7 C), Min:97 F (36.1 C), Max:98.9 F (37.2 C)  RD consulted for enteral/tube feeding intiation and management. TF orders have been modified.   Diet Order:  Diet NPO time specified  Skin:  Wound  (see comment) (Stage 4 sacral pressure ulcer, Stage 3 L heel pressure ulcer, Incision to back), +2 generalized edema  Last BM:  8/1  Height:   Ht Readings from Last 1 Encounters:  04/25/15 5\' 8"  (1.727 m)    Weight:   Wt Readings from Last 1 Encounters:  05/09/15 160 lb 12.8 oz (72.938 kg)  Admit weight (7/18) 96 lbs.  Weight trending up. Net I/O: +27,357 Volume overloaded.  Ideal Body Weight:  70 kg (kg)  BMI:  Body mass index is 24.46 kg/(m^2).  Estimated Nutritional Needs:   Kcal:  6789  Protein:  90-105 grams  Fluid:  Per MD  EDUCATION NEEDS:   No education needs identified at this time  Corrin Parker, MS, RD, LDN Pager # [phone] After hours/ weekend pager # [phone]

## 2015-05-09 NOTE — Progress Notes (Signed)
4 Days Post-Op  Subjective: Patient now on vent since yesterday with possible HCAP/volume overload. Currently weaning.  Still requiring low dose pressor  Wounds stable and without recurrent bleeding. Hgb 8.0 this am.  OR bone cultures with Staph aureus, sensitivities pending. Wbc to 48.3 Objective: Vital signs in last 24 hours: Temp:  [97.5 F (36.4 C)-98.9 F (37.2 C)] 98 F (36.7 C) (08/01 0800) Pulse Rate:  [85-110] 96 (08/01 0800) Resp:  [10-25] 10 (08/01 0800) BP: (68-179)/(40-157) 79/46 mmHg (08/01 0800) SpO2:  [90 %-100 %] 95 % (08/01 0800) FiO2 (%):  [45 %-80 %] 45 % (08/01 0722) Weight:  [72.938 kg (160 lb 12.8 oz)] 72.938 kg (160 lb 12.8 oz) (08/01 0300) Last BM Date: 05/09/15  Intake/Output from previous day: 07/31 0701 - 08/01 0700 In: 888.1 [I.V.:688.1; IV Piggyback:200] Out: 788 [Urine:788] Intake/Output this shift: Total I/O In: 18.8 [I.V.:18.8] Out: 295 [Urine:295]  General appearance: alert, moderate distress and on vent Sacral and bilateral ischial wounds are stable, without bleeding, pale pink tissue, little granulation  Lab Results:   Recent Labs  05/08/15 0800 05/09/15 0556  WBC 48.3* 48.3*  HGB 9.4* 8.0*  HCT 26.6* 22.1*  PLT 233 284   BMET  Recent Labs  05/08/15 0403 05/09/15 0556  NA 132* 131*  K 4.5 4.0  CL 105 110  CO2 21* 18*  GLUCOSE 129* 91  BUN 24* 24*  CREATININE 0.69 0.77  CALCIUM 7.2* 7.4*   PT/INR  Recent Labs  05/06/15 1600  LABPROT 20.8*  INR 1.80*   ABG  Recent Labs  05/08/15 0624 05/08/15 0856  PHART 7.275* 7.385  HCO3 18.7* 19.3*    Studies/Results: Portable Chest Xray  05/09/2015   CLINICAL DATA:  Respiratory failure, sepsis, decubitus ulcers, history of asthma and diabetes and previous CVA  EXAM: PORTABLE CHEST - 1 VIEW  COMPARISON:  Portable chest x-ray of May 08, 2015  FINDINGS: The lungs are adequately inflated. There are persistent small to moderate-sized pleural effusions. There are patchy  airspace opacities on the right consistent with pneumonia or pulmonary edema. Similar but less conspicuous findings are noted on the left in the mid and lower lung. The heart is normal in size. The pulmonary vascularity is not engorged. The endotracheal tube tip lies approximately 3 cm above the carina. The esophagogastric tube tip projects below the inferior margin of the image. The right subclavian venous catheter has its tip at the cavoatrial junction.  IMPRESSION: Persistent bilateral airspace opacities greater on the right than on the left consistent with pneumonia or pulmonary edema. There are moderate-sized bilateral pleural effusions. The support tubes are in reasonable position.   Electronically Signed   By: David  Martinique M.D.   On: 05/09/2015 07:24   Portable Chest Xray  05/08/2015   CLINICAL DATA:  Acute respiratory failure and hypoxia.  Intubation.  EXAM: PORTABLE CHEST - 1 VIEW  COMPARISON:  05/08/2015  FINDINGS: An endotracheal tube with tip 2 cm above the carina, right PICC line with tip overlying the superior cavoatrial junction, and NG tube within the stomach noted.  Airspace opacities throughout the right lung and left lower lung again noted.  Left lower lung consolidation/ atelectasis and bilateral pleural effusions are unchanged.  There is no evidence of pneumothorax.  IMPRESSION: Support apparatus as described. Endotracheal tube tip 2 cm above the carina.  Unchanged bilateral airspace opacities, left lower lung consolidation/atelectasis and bilateral pleural effusions.   Electronically Signed   By: Cleatis Polka.D.  On: 05/08/2015 08:25   Dg Chest Port 1 View  05/08/2015   CLINICAL DATA:  Shortness of breath.  EXAM: PORTABLE CHEST - 1 VIEW  COMPARISON:  Yesterday at 2324 hour  FINDINGS: Tip of the right upper extremity PICC in the SVC. Progressive opacification throughout the right hemithorax, likely related to layering pleural effusion. Left pleural effusion and basilar opacity is  unchanged. Decreased esophageal dilatation from prior. Cardiomediastinal contours are unchanged. No pneumothorax.  IMPRESSION: Progressive opacification throughout the right hemithorax, like related to layering of pleural effusion. Left pleural effusion and basilar opacity is unchanged.   Electronically Signed   By: Jeb Levering M.D.   On: 05/08/2015 06:55   Dg Chest Port 1 View  05/07/2015   CLINICAL DATA:  Dyspnea.  EXAM: PORTABLE CHEST - 1 VIEW  COMPARISON:  Earlier this day at 0541 hour  FINDINGS: Tip of the right upper extremity PICC at the atrial caval junction. Increased right pleural effusion from prior exam. Unchanged left pleural effusion. Cardiomediastinal contours are obscured. Presumed gaseous distention of esophagus in the mid thorax. No pneumothorax.  IMPRESSION: 1. Increased large right pleural effusion. Unchanged large left pleural effusion. 2. Presumed gaseous distention of esophagus in the mid thorax.   Electronically Signed   By: Jeb Levering M.D.   On: 05/07/2015 23:49    Anti-infectives: Anti-infectives    Start     Dose/Rate Route Frequency Ordered Stop   05/09/15 0930  ciprofloxacin (CIPRO) IVPB 400 mg     400 mg 200 mL/hr over 60 Minutes Intravenous Every 12 hours 05/09/15 0904     05/08/15 0900  aztreonam (AZACTAM) 2 g in dextrose 5 % 50 mL IVPB     2 g 100 mL/hr over 30 Minutes Intravenous 3 times per day 05/08/15 0826     05/06/15 0000  vancomycin (VANCOCIN) IVPB 750 mg/150 ml premix     750 mg 150 mL/hr over 60 Minutes Intravenous Every 48 hours 05/05/15 2357     04/28/15 2000  vancomycin (VANCOCIN) IVPB 750 mg/150 ml premix  Status:  Discontinued     750 mg 150 mL/hr over 60 Minutes Intravenous Every 24 hours 04/28/15 0731 05/04/15 1932   04/26/15 0800  vancomycin (VANCOCIN) IVPB 750 mg/150 ml premix  Status:  Discontinued     750 mg 150 mL/hr over 60 Minutes Intravenous Every 12 hours 04/25/15 1715 04/28/15 0731   04/26/15 0200  aztreonam (AZACTAM) 1 g  in dextrose 5 % 50 mL IVPB  Status:  Discontinued     1 g 100 mL/hr over 30 Minutes Intravenous Every 8 hours 04/25/15 1919 05/07/15 1035   04/25/15 1715  vancomycin (VANCOCIN) IVPB 750 mg/150 ml premix     750 mg 150 mL/hr over 60 Minutes Intravenous STAT 04/25/15 1715 04/25/15 1906   04/25/15 1645  aztreonam (AZACTAM) 2 g in dextrose 5 % 50 mL IVPB     2 g 100 mL/hr over 30 Minutes Intravenous  Once 04/25/15 1643 04/25/15 1807      Assessment/Plan: s/p Procedure(s): IRRIGATION AND DEBRIDEMENT SACRAL ULCER (Bilateral) Management per CCM  Continue daily dressing changes and monitor for recurrent bleeding.   LOS: 14 days    RAYBURN,SHAWN,PA-C Plastic Surgery [phone]

## 2015-05-09 NOTE — Progress Notes (Signed)
ANTIBIOTIC CONSULT NOTE - INITIAL  Pharmacy Consult for Cipro IV Indication: wound infection  Allergies  Allergen Reactions  . Cefuroxime Axetil Anaphylaxis  . Ertapenem Other (See Comments)    Rash and confusion  . Morphine And Related Other (See Comments)    Changed mental status, confusion, headache, visual hallucination  . Penicillins Anaphylaxis and Other (See Comments)    ?can take amoxicillin?  . Sulfa Antibiotics Anaphylaxis, Shortness Of Breath and Other (See Comments)  . Tessalon [Benzonatate] Anaphylaxis  . Shellfish Allergy Itching and Other (See Comments)    Took benadryl to alleviate reaction  . Miripirium Rash and Other (See Comments)    Change in mental status    Patient Measurements: Height: 5\' 8"  (172.7 cm) Weight: 160 lb 12.8 oz (72.938 kg) IBW/kg (Calculated) : 68.4  Vital Signs: Temp: 98 F (36.7 C) (08/01 0800) Temp Source: Oral (08/01 0800) BP: 79/46 mmHg (08/01 0800) Pulse Rate: 96 (08/01 0800) Intake/Output from previous day: 07/31 0701 - 08/01 0700 In: 888.1 [I.V.:688.1; IV Piggyback:200] Out: 788 [Urine:788] Intake/Output from this shift: Total I/O In: 18.8 [I.V.:18.8] Out: 295 [Urine:295]  Labs:  Recent Labs  05/07/15 1210 05/08/15 0403 05/08/15 0800 05/09/15 0556  WBC  --  54.2* 48.3* 48.3*  HGB  --  6.6* 9.4* 8.0*  PLT  --  246 233 284  CREATININE 0.71 0.69  --  0.77   Estimated Creatinine Clearance: 130.6 mL/min (by C-G formula based on Cr of 0.77). No results for input(s): VANCOTROUGH, VANCOPEAK, VANCORANDOM, GENTTROUGH, GENTPEAK, GENTRANDOM, TOBRATROUGH, TOBRAPEAK, TOBRARND, AMIKACINPEAK, AMIKACINTROU, AMIKACIN in the last 72 hours.   Microbiology: Recent Results (from the past 720 hour(s))  Urine culture     Status: None   Collection Time: 04/25/15  4:35 PM  Result Value Ref Range Status   Specimen Description URINE, CLEAN CATCH  Final   Special Requests Normal  Final   Culture   Final    >=100,000 COLONIES/mL  ESCHERICHIA COLI AZTREONAM SENSITIVE ZONE 32MM Performed at [facility] Performed at [facility]    Report Status 05/02/2015 FINAL  Final   Organism ID, Bacteria ESCHERICHIA COLI  Final      Susceptibility   Escherichia coli - MIC*    AMPICILLIN <=2 SENSITIVE Sensitive     CEFAZOLIN <=4 SENSITIVE Sensitive     CEFTRIAXONE <=1 SENSITIVE Sensitive     CIPROFLOXACIN >=4 RESISTANT Resistant     GENTAMICIN <=1 SENSITIVE Sensitive     IMIPENEM <=0.25 SENSITIVE Sensitive     NITROFURANTOIN <=16 SENSITIVE Sensitive     TRIMETH/SULFA <=20 SENSITIVE Sensitive     AMPICILLIN/SULBACTAM <=2 SENSITIVE Sensitive     PIP/TAZO <=4 SENSITIVE Sensitive     * >=100,000 COLONIES/mL ESCHERICHIA COLI  Blood culture (routine x 2)     Status: None   Collection Time: 04/25/15  5:00 PM  Result Value Ref Range Status   Specimen Description BLOOD RIGHT ANTECUBITAL  Final   Special Requests BOTTLES DRAWN AEROBIC AND ANAEROBIC 5 CC EA  Final   Culture  Setup Time   Final    GRAM POSITIVE COCCI IN CLUSTERS Gram Stain Report Called to,Read Back By and Verified With: Elveria Rising RN 17:30 04/26/15 ([email or web address]) IN BOTH AEROBIC AND ANAEROBIC BOTTLES    Culture   Final    METHICILLIN RESISTANT STAPHYLOCOCCUS AUREUS MICROAEROPHILIC STREPTOCOCCI Standardized susceptibility testing for this organism is not available. Performed at [facility]    Report Status 04/30/2015 FINAL  Final   Organism ID,  Bacteria METHICILLIN RESISTANT STAPHYLOCOCCUS AUREUS  Final      Susceptibility   Methicillin resistant staphylococcus aureus - MIC*    CIPROFLOXACIN >=8 RESISTANT Resistant     ERYTHROMYCIN >=8 RESISTANT Resistant     GENTAMICIN <=0.5 SENSITIVE Sensitive     OXACILLIN >=4 RESISTANT Resistant     TETRACYCLINE <=1 SENSITIVE Sensitive     VANCOMYCIN <=0.5 SENSITIVE Sensitive     TRIMETH/SULFA >=320 RESISTANT Resistant     CLINDAMYCIN >=8 RESISTANT Resistant     RIFAMPIN <=0.5 SENSITIVE Sensitive      Inducible Clindamycin NEGATIVE Sensitive     * METHICILLIN RESISTANT STAPHYLOCOCCUS AUREUS  Blood culture (routine x 2)     Status: None   Collection Time: 04/25/15  5:16 PM  Result Value Ref Range Status   Specimen Description BLOOD BLOOD RIGHT FOREARM  Final   Special Requests BOTTLES DRAWN AEROBIC AND ANAEROBIC 5 CC EA  Final   Culture  Setup Time   Final    GRAM POSITIVE COCCI IN CLUSTERS CRITICAL RESULT CALLED TO, READ BACK BY AND VERIFIED WITH: S GRAHAM 04/26/15 @ 27 M VESTAL IN BOTH AEROBIC AND ANAEROBIC BOTTLES    Culture   Final    METHICILLIN RESISTANT STAPHYLOCOCCUS AUREUS SUSCEPTIBILITIES PERFORMED ON PREVIOUS CULTURE WITHIN THE LAST 5 DAYS. MICROAEROPHILIC STREPTOCOCCI Standardized susceptibility testing for this organism is not available. Performed at [facility]    Report Status 05/03/2015 FINAL  Final  MRSA PCR Screening     Status: Abnormal   Collection Time: 04/25/15  6:55 PM  Result Value Ref Range Status   MRSA by PCR POSITIVE (A) NEGATIVE Final    Comment:        The GeneXpert MRSA Assay (FDA approved for NASAL specimens only), is one component of a comprehensive MRSA colonization surveillance program. It is not intended to diagnose MRSA infection nor to guide or monitor treatment for MRSA infections. RESULT CALLED TO, READ BACK BY AND VERIFIED WITH: Sharyn Lull REEVES,RN [id number] @ 2043 BY J SCOTTON   Culture, blood (routine x 2)     Status: None   Collection Time: 04/28/15  1:20 PM  Result Value Ref Range Status   Specimen Description BLOOD LEFT ARM  Final   Special Requests IN PEDIATRIC BOTTLE 1CC  Final   Culture  Setup Time   Final    GRAM POSITIVE COCCI IN CLUSTERS AEROBIC BOTTLE ONLY CRITICAL RESULT CALLED TO, READ BACK BY AND VERIFIED WITH: Hetty Blend RN [id number] 04/30/15 A BROWNING    Culture   Final    MICROAEROPHILIC STREPTOCOCCI Standardized susceptibility testing for this organism is not available. Performed at [facility]     Report Status 05/03/2015 FINAL  Final  Culture, blood (routine x 2)     Status: None   Collection Time: 04/28/15  5:50 PM  Result Value Ref Range Status   Specimen Description BLOOD PICC LINE  Final   Special Requests BOTTLES DRAWN AEROBIC AND ANAEROBIC 5CC EACH  Final   Culture   Final    NO GROWTH 5 DAYS Performed at [facility]    Report Status 05/03/2015 FINAL  Final  Culture, blood (routine x 2)     Status: None (Preliminary result)   Collection Time: 05/04/15  6:30 PM  Result Value Ref Range Status   Specimen Description BLOOD PICC LINE  Final   Special Requests BOTTLES DRAWN AEROBIC AND ANAEROBIC 5ML  Final   Culture   Final    NO  GROWTH 4 DAYS Performed at [facility]    Report Status PENDING  Incomplete  Culture, blood (routine x 2)     Status: None (Preliminary result)   Collection Time: 05/04/15  6:30 PM  Result Value Ref Range Status   Specimen Description BLOOD PICC LINE  Final   Special Requests BOTTLES DRAWN AEROBIC AND ANAEROBIC 5ML  Final   Culture   Final    NO GROWTH 4 DAYS Performed at [facility]    Report Status PENDING  Incomplete  Anaerobic culture     Status: None (Preliminary result)   Collection Time: 05/05/15  1:01 PM  Result Value Ref Range Status   Specimen Description ABSCESS  Final   Special Requests SPEC A ON SWAB RIGHT PELVIC ABSCESS  Final   Gram Stain   Final    FEW WBC PRESENT, PREDOMINANTLY MONONUCLEAR NO SQUAMOUS EPITHELIAL CELLS SEEN FEW GRAM POSITIVE COCCI IN PAIRS IN CLUSTERS Performed at [facility]    Culture   Final    NO ANAEROBES ISOLATED; CULTURE IN PROGRESS FOR 5 DAYS Performed at [facility]    Report Status PENDING  Incomplete  Culture, routine-abscess     Status: None (Preliminary result)   Collection Time: 05/05/15  1:01 PM  Result Value Ref Range Status   Specimen Description ABSCESS  Final   Special Requests SPEC A ON SWAB RIGHT PELVIC ABSCESS  Final   Gram Stain    Final    FEW WBC PRESENT, PREDOMINANTLY MONONUCLEAR NO SQUAMOUS EPITHELIAL CELLS SEEN FEW GRAM POSITIVE COCCI IN PAIRS IN CLUSTERS Performed at [facility]    Culture   Final    MODERATE STAPHYLOCOCCUS AUREUS Performed at [facility]    Report Status PENDING  Incomplete  Tissue culture     Status: None (Preliminary result)   Collection Time: 05/05/15  1:13 PM  Result Value Ref Range Status   Specimen Description TISSUE  Final   Special Requests SPEC B IN CUP BONE RIGHT ISCHIUM  Final   Gram Stain   Final    RARE WBC PRESENT, PREDOMINANTLY MONONUCLEAR NO ORGANISMS SEEN Performed at [facility]    Culture   Final    FEW STAPHYLOCOCCUS AUREUS Note: RIFAMPIN AND GENTAMICIN SHOULD NOT BE USED AS SINGLE DRUGS FOR TREATMENT OF STAPH INFECTIONS. Performed at [facility]    Report Status PENDING  Incomplete  Culture, respiratory (NON-Expectorated)     Status: None (Preliminary result)   Collection Time: 05/08/15  8:36 AM  Result Value Ref Range Status   Specimen Description TRACHEAL ASPIRATE  Final   Special Requests Normal  Final   Gram Stain   Final    RARE WBC PRESENT, PREDOMINANTLY PMN NO SQUAMOUS EPITHELIAL CELLS SEEN FEW GRAM NEGATIVE RODS RARE GRAM POSITIVE COCCI IN PAIRS RARE YEAST    Culture PENDING  Incomplete   Report Status PENDING  Incomplete    Medical History: Past Medical History  Diagnosis Date  . GERD (gastroesophageal reflux disease)   . Asthma   . Hx MRSA infection     on face  . Gastroparesis   . Diabetic neuropathy   . Seizures   . Family history of anesthesia complication     Pt mother can't have epidural procedures  . Dysrhythmia   . Pneumonia   . Arthritis   . Fibromyalgia   . 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 Type I diabetes mellitus  sees Dr. Loanne Drilling   . Syncope 02/16/2015    Medications:  Prescriptions prior to admission  Medication Sig Dispense Refill Last Dose  .  acetaminophen (TYLENOL) 325 MG tablet Take 2 tablets (650 mg total) by mouth every 6 (six) hours as needed for mild pain, moderate pain, fever or headache.   04/22/2015  . albuterol (PROVENTIL) (2.5 MG/3ML) 0.083% nebulizer solution Take 3 mLs (2.5 mg total) by nebulization every 4 (four) hours as needed for wheezing or shortness of breath. 75 mL 3 unknown  . apixaban (ELIQUIS) 5 MG TABS tablet Take 1 tablet (5 mg total) by mouth 2 (two) times daily. 180 tablet 3 04/25/2015 at 0800  . B Complex-Folic Acid (B COMPLEX-VITAMIN B12 PO) Take 1 tablet by mouth at bedtime.   04/24/2015 at Unknown time  . baclofen (LIORESAL) 10 MG tablet Take 1-2 tablets (10-20 mg total) by mouth 3 (three) times daily. Takes 10mg  in morning and lunchtime and takes 20mg  at bedtime 30 each 0 04/25/2015 at Unknown time  . collagenase (SANTYL) ointment Apply 1 application topically daily. 90 g 5 04/25/2015 at Unknown time  . diclofenac sodium (VOLTAREN) 1 % GEL Apply 2 g topically daily as needed (for pain). 100 g 0 Past Week at Unknown time  . feeding supplement, GLUCERNA SHAKE, (GLUCERNA SHAKE) LIQD Take 237 mLs by mouth 3 (three) times daily between meals. 30 Can 0 04/24/2015 at Unknown time  . fentaNYL (DURAGESIC - DOSED MCG/HR) 25 MCG/HR patch Place 1 patch (25 mcg total) onto the skin every 3 (three) days. 10 patch 0 04/25/2015 at Unknown time  . glucagon (GLUCAGON EMERGENCY) 1 MG injection Inject 1 mg into the vein once as needed. 1 each 12 04/23/2015  . insulin aspart (NOVOLOG) 100 UNIT/ML injection Inject 5 Units into the skin 3 (three) times daily with meals. 10 mL 11 04/25/2015 at Unknown time  . insulin glargine (LANTUS) 100 UNIT/ML injection Inject 0.3 mLs (30 Units total) into the skin at bedtime. (Patient taking differently: Inject 20 Units into the skin at bedtime. ) 10 mL 11 04/24/2015 at Unknown time  . loratadine (CLARITIN) 10 MG tablet Take 10 mg by mouth at bedtime.    04/24/2015 at Unknown time  . metoCLOPramide (REGLAN)  5 MG tablet Give 3 doses a week (Patient taking differently: Take 5 mg by mouth 2 (two) times a week. ) 12 tablet 11 04/25/2015 at Unknown time  . midodrine (PROAMATINE) 10 MG tablet Take 1 tablet (10 mg total) by mouth 3 (three) times daily with meals. 90 tablet 3 04/25/2015 at Unknown time  . mirtazapine (REMERON) 7.5 MG tablet Take 1 tablet (7.5 mg total) by mouth at bedtime. 30 tablet 3 04/24/2015 at Unknown time  . Multiple Vitamin (MULTIVITAMIN WITH MINERALS) TABS tablet Take 1 tablet by mouth daily.   04/24/2015 at Unknown time  . mupirocin ointment (BACTROBAN) 2 % 1 application QD  1 01/24/6221 at Unknown time  . omeprazole (PRILOSEC) 40 MG capsule Take 40 mg by mouth at bedtime.    04/24/2015 at Unknown time  . ondansetron (ZOFRAN ODT) 8 MG disintegrating tablet Take 1 tablet (8 mg total) by mouth every 8 (eight) hours as needed for nausea or vomiting. 60 tablet 5 Past Month at Unknown time  . Oxycodone HCl 10 MG TABS Take 1 tablet (10 mg total) by mouth every 6 (six) hours as needed. (Patient taking differently: Take 10-20 mg by mouth every 6 (six) hours as needed (pain). Pt can have 2  tablets when he goes to the wound center for dressing change) 150 tablet 0 04/25/2015 at Unknown time  . pregabalin (LYRICA) 100 MG capsule Take 1 capsule (100 mg total) by mouth 3 (three) times daily. Take 100 mg by mouth in the morning, take 100 mg by mouth in the afternoon, take 100 mg by mouth in the evening and take 200 mg by mouth at bedtime. 150 capsule 4 04/25/2015 at 1400  . metroNIDAZOLE (FLAGYL) 500 MG tablet Take 1 tablet (500 mg total) by mouth every 8 (eight) hours. (Patient not taking: Reported on 04/25/2015) 1 tablet 0 Taking  . nitrofurantoin, macrocrystal-monohydrate, (MACROBID) 100 MG capsule Take 1 capsule (100 mg total) by mouth 2 (two) times daily. (Patient not taking: Reported on 04/25/2015) 10 capsule 0 Taking  . Vancomycin (VANCOCIN) 750 MG/150ML SOLN Inject 150 mLs (750 mg total) into the vein  every 12 (twelve) hours. (Patient not taking: Reported on 04/25/2015) 150 mL 0 Taking   Assessment: Pt is a 29 yo M quad who currently has MRSA bacteremia and infected decubitus wounds + osteomyelitis. He is s/p I&D on 7/28. Pt has multiple anaphylactic rxns to abx and cipro IV is being added for expanded gram negative coverage.   Goal of Therapy:  Resolution of infection  Plan:  Ciprofloxacin IV 400 mg q12h Will continue to monitor for clinical improvement  Will enter a stop date when appropriate   Linda Hedges, PharmD Clinical Pharmacy Resident Pager: [phone] 05/09/2015,9:05 AM

## 2015-05-09 NOTE — Progress Notes (Signed)
[facility] for Infectious Disease    Date of Admission:  04/25/2015   Total days of antibiotics         Day 15 Aztreonam        Day 1 Cipro        Day 15 Vanc  Active Problems:   Type 1 diabetes, uncontrolled, with neuropathy   Spinal cord infarction (history of)   Sepsis   Stage IV decubitus ulcer   Urinary tract infectious disease   HCAP (healthcare-associated pneumonia)   Hypotension   Abdominopelvic abscess   Hemorrhagic shock   Acute blood loss anemia   Edema   . sodium chloride   Intravenous Once  . albuterol  2.5 mg Nebulization Q4H  . antiseptic oral rinse  7 mL Mouth Rinse QID  . aztreonam  2 g Intravenous 3 times per day  . baclofen  10 mg Oral BID WC  . baclofen  20 mg Oral QHS  . chlorhexidine  15 mL Mouth Rinse BID  . ciprofloxacin  400 mg Intravenous Q12H  . feeding supplement (PRO-STAT SUGAR FREE 64)  30 mL Per Tube BID  . feeding supplement (VITAL HIGH PROTEIN)  1,000 mL Per Tube Q24H  . fentaNYL (SUBLIMAZE) injection  50 mcg Intravenous Once  . hydrocortisone sodium succinate  50 mg Intravenous Q6H  . insulin aspart  2-6 Units Subcutaneous 6 times per day  . loratadine  10 mg Oral QHS  . midodrine  20 mg Oral TID WC  . mirtazapine  7.5 mg Oral QHS  . pantoprazole sodium  40 mg Per Tube Daily  . pregabalin  100 mg Oral TID AC  . pregabalin  200 mg Oral QHS  . sodium chloride  10-40 mL Intracatheter Q12H  . vancomycin  750 mg Intravenous Q48H    Subjective: Per RN report: Patient has thick tan secretions with suctioning. Plastic surgery in this AM to change his dressings and stated his wounds looked clean. Patient is agitated. Continues to have pressor requirements and fentanyl for pain/sedation. Nurse denies diarrhea from ostomy.   Review of Systems: Pt intubated: unable to perform ROS  Past Medical History  Diagnosis Date  . GERD (gastroesophageal reflux disease)   . Asthma   . Hx MRSA infection     on face  . Gastroparesis     . Diabetic neuropathy   . Seizures   . Family history of anesthesia complication     Pt mother can't have epidural procedures  . Dysrhythmia   . Pneumonia   . Arthritis   . Fibromyalgia   . 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 Type I diabetes mellitus     sees Dr. Loanne Drilling   . Syncope 02/16/2015    History  Substance Use Topics  . Smoking status: Former Smoker -- 1.00 packs/day for 12 years    Types: Cigars, Cigarettes    Quit date: 09/07/2014  . Smokeless tobacco: Never Used  . Alcohol Use: No    Family History  Problem Relation Age of Onset  . Diabetes Father   . Hypertension Father   . Asthma      fhx  . Hypertension      fhx  . Stroke      fhx  . Heart disease Mother    Allergies  Allergen Reactions  . Cefuroxime Axetil Anaphylaxis  . Ertapenem Other (See Comments)    Rash and confusion  .  Morphine And Related Other (See Comments)    Changed mental status, confusion, headache, visual hallucination  . Penicillins Anaphylaxis and Other (See Comments)    ?can take amoxicillin?  . Sulfa Antibiotics Anaphylaxis, Shortness Of Breath and Other (See Comments)  . Tessalon [Benzonatate] Anaphylaxis  . Shellfish Allergy Itching and Other (See Comments)    Took benadryl to alleviate reaction  . Miripirium Rash and Other (See Comments)    Change in mental status    OBJECTIVE: Blood pressure 79/46, pulse 96, temperature 98 F (36.7 C), temperature source Oral, resp. rate 10, height 5\' 8"  (1.727 m), weight 160 lb 12.8 oz (72.938 kg), SpO2 95 %. General: Agitated. Mitts on for protection.  Skin: Dressing to back and sacrum C/D/I.  Lungs: Rhonchi anterior upper lobes. Diminished lower lobes. Intubated. PSV 15/5. Thick tan secretions with suctioning.  Cor: RRR. Abdomen: Bowel sounds active X4.  GU: Foley patent. Urine yellow and clear.   Lab Results Lab Results  Component Value Date   WBC 48.3* 05/09/2015   HGB 8.0* 05/09/2015   HCT 22.1*  05/09/2015   MCV 83.4 05/09/2015   PLT 284 05/09/2015    Lab Results  Component Value Date   CREATININE 0.77 05/09/2015   BUN 24* 05/09/2015   NA 131* 05/09/2015   K 4.0 05/09/2015   CL 110 05/09/2015   CO2 18* 05/09/2015    Lab Results  Component Value Date   ALT 7* 04/28/2015   AST 8* 04/28/2015   ALKPHOS 173* 04/28/2015   BILITOT 0.4 04/28/2015     Microbiology: Recent Results (from the past 240 hour(s))  Culture, blood (routine x 2)     Status: None (Preliminary result)   Collection Time: 05/04/15  6:30 PM  Result Value Ref Range Status   Specimen Description BLOOD PICC LINE  Final   Special Requests BOTTLES DRAWN AEROBIC AND ANAEROBIC 5ML  Final   Culture   Final    NO GROWTH 4 DAYS Performed at [facility]    Report Status PENDING  Incomplete  Culture, blood (routine x 2)     Status: None (Preliminary result)   Collection Time: 05/04/15  6:30 PM  Result Value Ref Range Status   Specimen Description BLOOD PICC LINE  Final   Special Requests BOTTLES DRAWN AEROBIC AND ANAEROBIC 5ML  Final   Culture   Final    NO GROWTH 4 DAYS Performed at [facility]    Report Status PENDING  Incomplete  Anaerobic culture     Status: None (Preliminary result)   Collection Time: 05/05/15  1:01 PM  Result Value Ref Range Status   Specimen Description ABSCESS  Final   Special Requests SPEC A ON SWAB RIGHT PELVIC ABSCESS  Final   Gram Stain   Final    FEW WBC PRESENT, PREDOMINANTLY MONONUCLEAR NO SQUAMOUS EPITHELIAL CELLS SEEN FEW GRAM POSITIVE COCCI IN PAIRS IN CLUSTERS Performed at [facility]    Culture   Final    NO ANAEROBES ISOLATED; CULTURE IN PROGRESS FOR 5 DAYS Performed at [facility]    Report Status PENDING  Incomplete  Culture, routine-abscess     Status: None (Preliminary result)   Collection Time: 05/05/15  1:01 PM  Result Value Ref Range Status   Specimen Description ABSCESS  Final   Special Requests SPEC A ON SWAB RIGHT  PELVIC ABSCESS  Final   Gram Stain   Final    FEW WBC PRESENT, PREDOMINANTLY MONONUCLEAR NO SQUAMOUS  EPITHELIAL CELLS SEEN FEW GRAM POSITIVE COCCI IN PAIRS IN CLUSTERS Performed at [facility]    Culture   Final    MODERATE STAPHYLOCOCCUS AUREUS Performed at [facility]    Report Status PENDING  Incomplete  Tissue culture     Status: None (Preliminary result)   Collection Time: 05/05/15  1:13 PM  Result Value Ref Range Status   Specimen Description TISSUE  Final   Special Requests SPEC B IN CUP BONE RIGHT ISCHIUM  Final   Gram Stain   Final    RARE WBC PRESENT, PREDOMINANTLY MONONUCLEAR NO ORGANISMS SEEN Performed at [facility]    Culture   Final    FEW STAPHYLOCOCCUS AUREUS Note: RIFAMPIN AND GENTAMICIN SHOULD NOT BE USED AS SINGLE DRUGS FOR TREATMENT OF STAPH INFECTIONS. Performed at [facility]    Report Status PENDING  Incomplete  Culture, respiratory (NON-Expectorated)     Status: None (Preliminary result)   Collection Time: 05/08/15  8:36 AM  Result Value Ref Range Status   Specimen Description TRACHEAL ASPIRATE  Final   Special Requests Normal  Final   Gram Stain   Final    RARE WBC PRESENT, PREDOMINANTLY PMN NO SQUAMOUS EPITHELIAL CELLS SEEN FEW GRAM NEGATIVE RODS RARE GRAM POSITIVE COCCI IN PAIRS RARE YEAST    Culture   Final    Culture reincubated for better growth Performed at [facility]    Report Status PENDING  Incomplete    Assessment/Plan: 1. Osteomyelitis: OR debridement 7/28 and abscess culture with Staph aureus. Blood cultures with MRSA and Strep.Will cont Vanc and monitor cultures. TEE neg.  2. HCAP: Bronch on 7/31 with cultures sent and pending. Per critical care team, Cont Aztreonam, Cipro added for more Gram neg coverage.    Tilden Fossa, NP student [facility] for Infectious Disease [facility] 05/09/2015, 10:47 AM  INFECTIOUS DISEASE ATTENDING ADDENDUM:      [facility] for Infectious Disease   Date: 05/09/2015  Patient name: Jason [name]  Medical record number: [id number]  Date of birth: 21-Oct-1985    This patient has been seen and discussed with the NP. Please see her note for complete details. I concur with their findings with the following additions/corrections:  Arkel with restraints on today as he tried to pull ETT tube out when his bandage was externally examined. 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 Would like to get him off FQ as soon as possible  #3 Goals of care:   I would strongly consider a palliative care consult. Dillyn is being admitted nearly every month to the hospital. He has has large number of aggressive surgeries by both plastics, general surgery along with protracted IV antibiotics. His mother takes considerable time to care for him but he has based on my past conversations with his Mom never really wanted to be compliant with frequent turning that is required for him to heal up his decubitus ulcers. His prognosis despite his age is grim in my  opinion.    [name] 05/09/2015, 7:25 PM

## 2015-05-09 NOTE — Progress Notes (Signed)
Hypoglycemic Event  CBG: 56  Treatment: D50 IV 25 mL  Symptoms: Sweaty  Follow-up CBG: [id number] CBG Result:91  Possible Reasons for Event: Inadequate meal intake  Comments/MD notified:Dr. Cathren Laine, Kyung Bacca  Remember to initiate Hypoglycemia Order Set & complete

## 2015-05-10 ENCOUNTER — Inpatient Hospital Stay (HOSPITAL_COMMUNITY): Payer: Medicaid Other

## 2015-05-10 DIAGNOSIS — J9819 Other pulmonary collapse: Secondary | ICD-10-CM | POA: Insufficient documentation

## 2015-05-10 LAB — CBC WITH DIFFERENTIAL/PLATELET
BASOS ABS: 0 10*3/uL (ref 0.0–0.1)
BASOS PCT: 0 % (ref 0–1)
EOS ABS: 0 10*3/uL (ref 0.0–0.7)
Eosinophils Relative: 0 % (ref 0–5)
HCT: 21.4 % — ABNORMAL LOW (ref 39.0–52.0)
HEMOGLOBIN: 7.6 g/dL — AB (ref 13.0–17.0)
LYMPHS ABS: 0.9 10*3/uL (ref 0.7–4.0)
Lymphocytes Relative: 2 % — ABNORMAL LOW (ref 12–46)
MCH: 30.3 pg (ref 26.0–34.0)
MCHC: 35.5 g/dL (ref 30.0–36.0)
MCV: 85.3 fL (ref 78.0–100.0)
Monocytes Absolute: 0.9 10*3/uL (ref 0.1–1.0)
Monocytes Relative: 2 % — ABNORMAL LOW (ref 3–12)
NEUTROS ABS: 45.1 10*3/uL — AB (ref 1.7–7.7)
Neutrophils Relative %: 96 % — ABNORMAL HIGH (ref 43–77)
PLATELETS: 281 10*3/uL (ref 150–400)
RBC: 2.51 MIL/uL — AB (ref 4.22–5.81)
RDW: 15.9 % — ABNORMAL HIGH (ref 11.5–15.5)
WBC MORPHOLOGY: INCREASED
WBC: 46.9 10*3/uL — ABNORMAL HIGH (ref 4.0–10.5)

## 2015-05-10 LAB — COMPREHENSIVE METABOLIC PANEL
ALK PHOS: 105 U/L (ref 38–126)
ALT: 12 U/L — ABNORMAL LOW (ref 17–63)
AST: 29 U/L (ref 15–41)
Albumin: <1 g/dL — ABNORMAL LOW (ref 3.5–5.0)
Anion gap: 9 (ref 5–15)
BILIRUBIN TOTAL: 0.3 mg/dL (ref 0.3–1.2)
BUN: 23 mg/dL — AB (ref 6–20)
CALCIUM: 7.5 mg/dL — AB (ref 8.9–10.3)
CO2: 17 mmol/L — ABNORMAL LOW (ref 22–32)
CREATININE: 0.64 mg/dL (ref 0.61–1.24)
Chloride: 107 mmol/L (ref 101–111)
GFR calc Af Amer: >60 mL/min (ref >60)
GLUCOSE: 133 mg/dL — AB (ref 65–99)
Potassium: 3.4 mmol/L — ABNORMAL LOW (ref 3.5–5.1)
Sodium: 133 mmol/L — ABNORMAL LOW (ref 135–145)
TOTAL PROTEIN: 4.2 g/dL — AB (ref 6.5–8.1)

## 2015-05-10 LAB — MAGNESIUM
MAGNESIUM: 1.8 mg/dL (ref 1.7–2.4)
MAGNESIUM: 1.7 mg/dL (ref 1.7–2.4)

## 2015-05-10 LAB — PHOSPHORUS
PHOSPHORUS: 3.1 mg/dL (ref 2.5–4.6)
Phosphorus: 2.8 mg/dL (ref 2.5–4.6)

## 2015-05-10 LAB — CULTURE, BLOOD (ROUTINE X 2): Special Requests: NORMAL

## 2015-05-10 LAB — ANAEROBIC CULTURE

## 2015-05-10 LAB — PREALBUMIN: PREALBUMIN: 4.8 mg/dL — AB (ref 18–38)

## 2015-05-10 LAB — GLUCOSE, CAPILLARY
GLUCOSE-CAPILLARY: 123 mg/dL — AB (ref 65–99)
GLUCOSE-CAPILLARY: 135 mg/dL — AB (ref 65–99)
GLUCOSE-CAPILLARY: 153 mg/dL — AB (ref 65–99)
Glucose-Capillary: 116 mg/dL — ABNORMAL HIGH (ref 65–99)
Glucose-Capillary: 136 mg/dL — ABNORMAL HIGH (ref 65–99)
Glucose-Capillary: 148 mg/dL — ABNORMAL HIGH (ref 65–99)

## 2015-05-10 LAB — SEDIMENTATION RATE: Sed Rate: 50 mm/hr — ABNORMAL HIGH (ref 0–16)

## 2015-05-10 LAB — LACTIC ACID, PLASMA: LACTIC ACID, VENOUS: 2.4 mmol/L — AB (ref 0.5–2.0)

## 2015-05-10 LAB — C-REACTIVE PROTEIN: CRP: 19.3 mg/dL — ABNORMAL HIGH (ref <1.0)

## 2015-05-10 MED ORDER — POTASSIUM CHLORIDE 20 MEQ/15ML (10%) PO SOLN
40.0000 meq | ORAL | Status: AC
Start: 2015-05-10 — End: 2015-05-10
  Administered 2015-05-10 (×2): 40 meq via ORAL
  Filled 2015-05-10 (×3): qty 30

## 2015-05-10 MED ORDER — SODIUM CHLORIDE 0.9 % IV SOLN
INTRAVENOUS | Status: DC
  Administered 2015-05-17: via INTRAVENOUS
  Administered 2015-05-22: 500 mL via INTRAVENOUS
  Administered 2015-05-23 – 2015-05-29 (×2): via INTRAVENOUS

## 2015-05-10 MED ORDER — ETOMIDATE 2 MG/ML IV SOLN
INTRAVENOUS | Status: AC
Start: 2015-05-10 — End: 2015-05-11
  Filled 2015-05-10: qty 10

## 2015-05-10 MED ORDER — ETOMIDATE 2 MG/ML IV SOLN
INTRAVENOUS | Status: AC
  Administered 2015-05-10: 20 mg
  Filled 2015-05-10: qty 10

## 2015-05-10 MED ORDER — SODIUM CHLORIDE 0.9 % IV BOLUS (SEPSIS)
500.0000 mL | Freq: Once | INTRAVENOUS | Status: AC
  Administered 2015-05-10: 500 mL via INTRAVENOUS

## 2015-05-10 NOTE — Procedures (Signed)
Bedside Bronchoscopy Procedure Note Jason [name] [id number] [birth year]  Procedure: Bronchoscopy Indications: Diagnostic evaluation of the airways and Remove secretions  Procedure Details: ET Tube Size: ET Tube secured at lip (cm): Bite block in place: Yes In preparation for procedure, Patient hyper-oxygenated with 100 % FiO2 Airway entered and the following bronchi were examined: RUL, RML, RLL, LUL, LLL and Bronchi.   Bronchoscope removed.    Evaluation BP 97/61 mmHg  Pulse 104  Temp(Src) 97.7 F (36.5 C) (Oral)  Resp 23  Ht 5\' 8"  (1.727 m)  Wt 160 lb 12.8 oz (72.938 kg)  BMI 24.46 kg/m2  SpO2 100% Breath Sounds:Clear O2 sats: stable throughout Patient's Current Condition: stable Specimens:  None Complications: No apparent complications Patient did tolerate procedure well.   Philomena Doheny 05/10/2015, 1:59 PM

## 2015-05-10 NOTE — Progress Notes (Signed)
Nutrition Follow-up  DOCUMENTATION CODES:   Severe malnutrition in context of acute illness/injury, Underweight  INTERVENTION:   Continue TF via OGT with Vital AF 1.2 formula at goal rate of 60 ml/h (1440 ml per day) to provide 1728 kcals, 108 gm protein (102% of protein needs), 1166 ml free water daily.  RD to continue to monitor.   NUTRITION DIAGNOSIS:   Increased nutrient needs related to wound healing as evidenced by estimated needs; ongoing  GOAL:   Patient will meet greater than or equal to 90% of their needs; met  MONITOR:   Vent status, TF tolerance, Skin, I & O's, Labs, Weight trends  REASON FOR ASSESSMENT:   Malnutrition Screening Tool    ASSESSMENT:   29 year old male who has a past medical history of GERD (gastroesophageal reflux disease); Asthma; MRSA infection; Gastroparesis; Diabetic neuropathy; Seizures; Family history of anesthesia complication; Dysrhythmia; Pneumonia; Arthritis; Fibromyalgia; Stroke, Type I diabetes mellitus; and Syncope.   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 Patient was sent to the emergency room from the physical therapy office today after patient was found to be hypotensive. [name] says that he has not been eating and drinking well for past twoweeks. In the ED patient was found to be dehydrated, had white count of 20,000, chest x-ray showing pneumonia as well as abnormal UA consistent with UTI.  Patient s/p procedure 7/28: IRRIGATION AND DEBRIDEMENT SACRAL ULCER - PLACEMENT OF ACELL AND VAC  Patient is currently intubated on ventilator support MV: 10.4 L/min Temp (24hrs), Avg:96.2 F (35.7 C), Min:94.3 F (34.6 C), Max:98.3 F (36.8 C)  RD consulted for wound healing. Tube feeding is now at goal rate. TF regimen is providing 96% of kcal needs, and 102% of protein needs. RD to continue with current orders. Noted pt volume overloaded, +3 edema. RD to continue to monitor.   Labs and  medications reviewed.   Diet Order:  Diet NPO time specified  Skin:  Wound (see comment) (Stage 4 sacral pressure ulcer, Stage 3 L heel pressure ulcer, Incision to back), +3 generalized edema  Last BM:  8/2  Height:   Ht Readings from Last 1 Encounters:  04/25/15 5' 8"  (1.727 m)    Weight:   Wt Readings from Last 1 Encounters:  05/10/15 160 lb 12.8 oz (72.938 kg)  Admit weight (7/18) 96 lbs.  Weight trending up. Net I/O: +29,000  Ideal Body Weight:  70 kg (kg)  BMI:  Body mass index is 24.46 kg/(m^2).  Estimated Nutritional Needs:   Kcal:  9924  Protein:  90-105 grams  Fluid:  Per MD  EDUCATION NEEDS:   No education needs identified at this time  Corrin Parker, MS, RD, LDN Pager # [phone] After hours/ weekend pager # [phone]

## 2015-05-10 NOTE — Progress Notes (Signed)
[place] for Infectious Disease    Date of Admission:  04/25/2015   Total days of antibiotics 16        Day 16 Vanc        Day 16 Aztreonam        Day 2 Cipro  Active Problems:   Type 1 diabetes, uncontrolled, with neuropathy   Spinal cord infarction (history of)   Sepsis   Stage IV decubitus ulcer   Urinary tract infectious disease   HCAP (healthcare-associated pneumonia)   Hypotension   Abdominopelvic abscess   Hemorrhagic shock   Acute blood loss anemia   Edema   MRSA bacteremia   . sodium chloride   Intravenous Once  . albuterol  2.5 mg Nebulization Q4H  . antiseptic oral rinse  7 mL Mouth Rinse QID  . aztreonam  2 g Intravenous 3 times per day  . baclofen  10 mg Oral BID WC  . baclofen  20 mg Oral QHS  . chlorhexidine  15 mL Mouth Rinse BID  . ciprofloxacin  400 mg Intravenous Q12H  . hydrocortisone sodium succinate  50 mg Intravenous Q6H  . insulin aspart  2-6 Units Subcutaneous 6 times per day  . loratadine  10 mg Oral QHS  . midodrine  20 mg Oral TID WC  . mirtazapine  7.5 mg Oral QHS  . pantoprazole sodium  40 mg Per Tube Daily  . pregabalin  100 mg Oral TID AC  . pregabalin  200 mg Oral QHS  . sodium chloride  10-40 mL Intracatheter Q12H  . vancomycin  750 mg Intravenous Q48H    Subjective: Nurse states he has been more alert since stopping sedation. Following simple commands. Calm in bed.   Review of Systems: UTA, intubated  Past Medical History  Diagnosis Date  . GERD (gastroesophageal reflux disease)   . Asthma   . Hx MRSA infection     on face  . Gastroparesis   . Diabetic neuropathy   . Seizures   . Family history of anesthesia complication     Pt mother can't have epidural procedures  . Dysrhythmia   . Pneumonia   . Arthritis   . Fibromyalgia   . 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 Type I diabetes mellitus     sees Dr. Loanne Drilling   . Syncope 02/16/2015    History  Substance Use Topics  .  Smoking status: Former Smoker -- 1.00 packs/day for 12 years    Types: Cigars, Cigarettes    Quit date: 09/07/2014  . Smokeless tobacco: Never Used  . Alcohol Use: No    Family History  Problem Relation Age of Onset  . Diabetes Father   . Hypertension Father   . Asthma      fhx  . Hypertension      fhx  . Stroke      fhx  . Heart disease Mother    Allergies  Allergen Reactions  . Cefuroxime Axetil Anaphylaxis  . Ertapenem Other (See Comments)    Rash and confusion  . Morphine And Related Other (See Comments)    Changed mental status, confusion, headache, visual hallucination  . Penicillins Anaphylaxis and Other (See Comments)    ?can take amoxicillin?  . Sulfa Antibiotics Anaphylaxis, Shortness Of Breath and Other (See Comments)  . Tessalon [Benzonatate] Anaphylaxis  . Shellfish Allergy Itching and Other (See Comments)    Took benadryl to  alleviate reaction  . Miripirium Rash and Other (See Comments)    Change in mental status    OBJECTIVE: Blood pressure 110/59, pulse 92, temperature 98 F (36.7 C), temperature source Oral, resp. rate 23, height 5\' 8"  (1.727 m), weight 160 lb 12.8 oz (72.938 kg), SpO2 97 %. General: Afebrile, has been on warming blanket since last night for hypothermia Skin: Sacral ulcer with dressing C/D/I Lungs: Crackles throughout lungs. Intubated.  Cor: Tachycardic. PICC line. +3/4 pitting edema BUE, BLE Abdomen: Ostomy with no change in output.  Neuro: Quadriplegic  Lab Results Lab Results  Component Value Date   WBC 46.9* 05/10/2015   HGB 7.6* 05/10/2015   HCT 21.4* 05/10/2015   MCV 85.3 05/10/2015   PLT 281 05/10/2015    Lab Results  Component Value Date   CREATININE 0.64 05/10/2015   BUN 23* 05/10/2015   NA 133* 05/10/2015   K 3.4* 05/10/2015   CL 107 05/10/2015   CO2 17* 05/10/2015    Lab Results  Component Value Date   ALT 12* 05/10/2015   AST 29 05/10/2015   ALKPHOS 105 05/10/2015   BILITOT 0.3 05/10/2015      Microbiology: Recent Results (from the past 240 hour(s))  Culture, blood (routine x 2)     Status: None   Collection Time: 05/04/15  6:30 PM  Result Value Ref Range Status   Specimen Description BLOOD PICC LINE  Final   Special Requests BOTTLES DRAWN AEROBIC AND ANAEROBIC 5ML  Final   Culture   Final    NO GROWTH 5 DAYS Performed at [facility]    Report Status 05/09/2015 FINAL  Final  Culture, blood (routine x 2)     Status: None   Collection Time: 05/04/15  6:30 PM  Result Value Ref Range Status   Specimen Description BLOOD PICC LINE  Final   Special Requests BOTTLES DRAWN AEROBIC AND ANAEROBIC 5ML  Final   Culture   Final    NO GROWTH 5 DAYS Performed at [facility]    Report Status 05/09/2015 FINAL  Final  Anaerobic culture     Status: None (Preliminary result)   Collection Time: 05/05/15  1:01 PM  Result Value Ref Range Status   Specimen Description ABSCESS  Final   Special Requests SPEC A ON SWAB RIGHT PELVIC ABSCESS  Final   Gram Stain   Final    FEW WBC PRESENT, PREDOMINANTLY MONONUCLEAR NO SQUAMOUS EPITHELIAL CELLS SEEN FEW GRAM POSITIVE COCCI IN PAIRS IN CLUSTERS Performed at [facility]    Culture   Final    NO ANAEROBES ISOLATED; CULTURE IN PROGRESS FOR 5 DAYS Performed at [facility]    Report Status PENDING  Incomplete  Culture, routine-abscess     Status: None   Collection Time: 05/05/15  1:01 PM  Result Value Ref Range Status   Specimen Description ABSCESS  Final   Special Requests SPEC A ON SWAB RIGHT PELVIC ABSCESS  Final   Gram Stain   Final    FEW WBC PRESENT, PREDOMINANTLY MONONUCLEAR NO SQUAMOUS EPITHELIAL CELLS SEEN FEW GRAM POSITIVE COCCI IN PAIRS IN CLUSTERS Performed at [facility]    Culture   Final    MODERATE METHICILLIN RESISTANT STAPHYLOCOCCUS AUREUS Note: RIFAMPIN AND GENTAMICIN SHOULD NOT BE USED AS SINGLE DRUGS FOR TREATMENT OF STAPH INFECTIONS. CRITICAL RESULT CALLED TO, READ BACK BY  AND VERIFIED WITH: IREKIA B. 8/1 @216  BY REAMM Performed at [facility]  Report Status 05/09/2015 FINAL  Final   Organism ID, Bacteria METHICILLIN RESISTANT STAPHYLOCOCCUS AUREUS  Final      Susceptibility   Methicillin resistant staphylococcus aureus - MIC*    CLINDAMYCIN >=8 RESISTANT Resistant     ERYTHROMYCIN >=8 RESISTANT Resistant     GENTAMICIN <=0.5 SENSITIVE Sensitive     LEVOFLOXACIN >=8 RESISTANT Resistant     OXACILLIN >=4 RESISTANT Resistant     RIFAMPIN <=0.5 SENSITIVE Sensitive     TRIMETH/SULFA >=320 RESISTANT Resistant     VANCOMYCIN 1 SENSITIVE Sensitive     TETRACYCLINE <=1 SENSITIVE Sensitive     * MODERATE METHICILLIN RESISTANT STAPHYLOCOCCUS AUREUS  Tissue culture     Status: None   Collection Time: 05/05/15  1:13 PM  Result Value Ref Range Status   Specimen Description TISSUE  Final   Special Requests SPEC B IN CUP BONE RIGHT ISCHIUM  Final   Gram Stain   Final    RARE WBC PRESENT, PREDOMINANTLY MONONUCLEAR NO ORGANISMS SEEN Performed at [facility]    Culture   Final    FEW METHICILLIN RESISTANT STAPHYLOCOCCUS AUREUS Note: RIFAMPIN AND GENTAMICIN SHOULD NOT BE USED AS SINGLE DRUGS FOR TREATMENT OF STAPH INFECTIONS. CRITICAL RESULT CALLED TO, READ BACK BY AND VERIFIED WITH: IREKIA B. 8/1 @216  BY REAMM Performed at [facility]    Report Status 05/09/2015 FINAL  Final   Organism ID, Bacteria METHICILLIN RESISTANT STAPHYLOCOCCUS AUREUS  Final      Susceptibility   Methicillin resistant staphylococcus aureus - MIC*    CLINDAMYCIN >=8 RESISTANT Resistant     ERYTHROMYCIN >=8 RESISTANT Resistant     GENTAMICIN <=0.5 SENSITIVE Sensitive     LEVOFLOXACIN >=8 RESISTANT Resistant     OXACILLIN >=4 RESISTANT Resistant     RIFAMPIN <=0.5 SENSITIVE Sensitive     TRIMETH/SULFA >=320 RESISTANT Resistant     VANCOMYCIN 1 SENSITIVE Sensitive     TETRACYCLINE <=1 SENSITIVE Sensitive     * FEW METHICILLIN RESISTANT STAPHYLOCOCCUS AUREUS    Culture, respiratory (NON-Expectorated)     Status: None (Preliminary result)   Collection Time: 05/08/15  8:36 AM  Result Value Ref Range Status   Specimen Description TRACHEAL ASPIRATE  Final   Special Requests Normal  Final   Gram Stain   Final    RARE WBC PRESENT, PREDOMINANTLY PMN NO SQUAMOUS EPITHELIAL CELLS SEEN FEW GRAM NEGATIVE RODS RARE GRAM POSITIVE COCCI IN PAIRS RARE YEAST    Culture   Final    ABUNDANT GRAM NEGATIVE RODS Performed at [facility]    Report Status PENDING  Incomplete    Assessment Final results of tissue culture (7/28) show Vanc sensitive MRSA. Pending Anaerobic cultures. TEE neg. Hypothermic to normothermic overnight, was placed on warming blanket.  RN's unable to obtain blood cultures yesterday r/t edema.  Unfortunately, CXR this AM shows progressive bilateral pleural effusions and PNA. Continues to require ventilatory support. Nurse has not needed to suction him.  Weaned off sedation, low pressor requirement.   Plan: 1. Micro-aerophilic streptococcal and MRSA bacteremia: Blood cultures last drawn 7/28. Will need repeat blood cultures, removal of ALL lines and central line holiday once he is hemodynamically stable.  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 Daily dressing changes per Plastic rec's. Cont Vanc as bone cx shows Vanc sensitive.  3. HCAP: Currently on Cipro and aztreonam for broad coverage due to multiple ABX allergies. D/C Cipro as soon as able.   Tilden Fossa, NP student [facility]  for Infectious Disease [facility] 05/10/2015, 9:39 AM   INFECTIOUS DISEASE ATTENDING ADDENDUM:     [place] for Infectious Disease   Date: 05/10/2015  Patient name: Jason [name]  Medical record number: [id number]  Date of birth: 1985-10-31    This patient has been seen and discussed with the NP student.. Please see her note for complete details. I concur with their findings with the  following additions/corrections:  Patient now sp bronchoscopy and all airways evaluated. He is growing GNR in respiratory cultures on cipro/aztreoname for HCAP  He remains on vancomycin for his MRSA and microaerophilic streptococcal bacteremias and MRSA osteomyelitis  He DOES NEED lines removed when HD stable and central line holiday with blood cultures done with NO central lines to document sterile blood in absence of central lines   [employer] 05/10/2015, 7:14 PM

## 2015-05-10 NOTE — Procedures (Signed)
Bronchoscopy Procedure Note Jason [name] [id number] [birth year]  Procedure: Bronchoscopy Indications: Diagnostic evaluation of the airways and Obtain specimens for culture and/or other diagnostic studies  Procedure Details Consent: Risks of procedure as well as the alternatives and risks of each were explained to the (patient/caregiver).  Consent for procedure obtained. Time Out: Verified patient identification, verified procedure, site/side was marked, verified correct patient position, special equipment/implants available, medications/allergies/relevent history reviewed, required imaging and test results available.  Performed  In preparation for procedure, patient was given 100% FiO2 and bronchoscope lubricated. Sedation: Etomidate  Airway entered and the following bronchi were examined: RUL, RML, RLL, LUL, LLL and Bronchi.   Procedures performed: Brushings performed no Bronchoscope removed.  , Patient placed back on 100% FiO2 at conclusion of procedure.    Evaluation Hemodynamic Status: BP stable throughout; O2 sats: stable throughout Patient's Current Condition: stable Specimens:  None Complications: No apparent complications Patient did tolerate procedure well.   Raylene Miyamoto. 05/10/2015  1. Mild tan secretions string rt main and mild left main , suctioned 2. No obstruction 3. No distal plugging after lavage 20 cc saline  Tolerated well  Reassuring  Lavon Paganini. Titus Mould, MD, [place] Pgr: [place] [facility]

## 2015-05-10 NOTE — Care Management Note (Signed)
Case Management Note  Patient Details  Name: Jason [name] MRN: [id number] Date of Birth: 08/12/1986  Subjective/Objective:   Father in room sitting at bedside.  Mom at home, but I talked with both with the use of speaker phone.  Mom cares for son totally.  Patient has electric W/C at home and is total care.  If sitting up can hold own drink and drink from it and can eat most foods, but due to tremor liquid food or foods like spaghetti are difficult and Mom states she usually feeds.  Has had [facility] coming out to assist with dressing changes but Mom becomes very defensive and states they made it worse, whatever they do.  Does not like the nurse that has been coming out.  Is aware of CAPS program through Medicaid, but feels she does not need this.  Patient does not want someone else caring for him.   [facility] had called me earlier stating that due his condition continuing to deteriorate, they feel he is too high risk and will not be continuing [place] services.  Mom states she doesn't need them anyway can do it herself like she always does.  Also states he has been on the vent and sick like this before and he always pulls through and comes back home.  Feels he will be doing that again.                 Action/Plan:   Expected Discharge Date:   (unknown)               Expected Discharge Plan:  Acute to Acute Transfer  In-House Referral:  NA  Discharge planning Services  CM Consult  Post Acute Care [place]:  NA Choice offered to:  NA  DME Arranged:  N/A DME Agency:  NA  HH Arranged:  NA HH Agency:  NA  Status of Service:  In process, will continue to follow  Medicare Important Message Given:    Date Medicare IM Given:    Medicare IM give by:    Date Additional Medicare IM Given:    Additional Medicare Important Message give by:     If discussed at [place] of Stay Meetings, dates discussed:    Additional Comments:  Vergie Living, RN 05/10/2015, 1:38 PM

## 2015-05-10 NOTE — Progress Notes (Signed)
PULMONARY / CRITICAL CARE MEDICINE   Name: Jason [name] MRN: [id number] DOB: [birth year]    ADMISSION DATE: 04/25/2015 TODAY'S DATE: 05/10/2015  REFERRING MD : TRH  CHIEF COMPLAINT: Hypotension  INITIAL PRESENTATION: 29 year old male quad with MRSA bacteremia from multiple sources. Concern for endocarditis. Wounds have been checked and followed by general surgery and ortho. Patient has history of hypotension and concern for septic shock and levophed was started and PCCM consulted. 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 Now with possible HCAP.   SIGNIFICANT EVENTS: 7/28 >> OR for debridement of osteomyelitis and abscess cx with staph. Positive MRSA and strep blood cx 7/28 >> Reconsulted for extensive post op bleeding and hypotension 7/31 >> worsening respiratory status, acidosis, hypoxemia, hypotension overnight  STUDIES: 7/28 TEE >> NO vegetation, normal LV function  7/31 Bronch >> HCAP, extensive mucous plug  SUBJECTIVE: remain son vent  VITAL SIGNS: Temp:  [94.3 F (34.6 C)-98.3 F (36.8 C)] 98 F (36.7 C) (08/02 0800) Pulse Rate:  [80-117] 106 (08/02 1000) Resp:  [20-26] 23 (08/02 1000) BP: (80-111)/(50-83) 81/50 mmHg (08/02 1000) SpO2:  [93 %-100 %] 97 % (08/02 1000) FiO2 (%):  [40 %] 40 % (08/02 0800) Weight:  [72.938 kg (160 lb 12.8 oz)] 72.938 kg (160 lb 12.8 oz) (08/02 0403) HEMODYNAMICS: CVP:  [3 mmHg-6 mmHg] 5 mmHg VENTILATOR SETTINGS: Vent Mode:  [-] PRVC FiO2 (%):  [40 %] 40 % Set Rate:  [20 bmp] 20 bmp Vt Set:  [550 mL] 550 mL PEEP:  [5 cmH20] 5 cmH20 Plateau Pressure:  [21 cmH20-27 cmH20] 26 cmH20 INTAKE / OUTPUT:  Intake/Output Summary (Last 24 hours) at 05/10/15 1015 Last data filed at 05/10/15 0903  Gross per 24 hour  Intake 2652.03 ml  Output   1825 ml  Net 827.03 ml    PHYSICAL EXAMINATION:  Gen: chronically ill appearing, rass -2 HENT: ett PULM: ronchi CV: RRR, no mgr, edema arms,  feet GI: BS+, soft, nontender Derm: chronic breakdown, sacral wound , plastics evaluating Neuro: deeper sedation , rass -2 to -3   LABS:  CBC  Recent Labs Lab 05/08/15 0800 05/09/15 0556 05/10/15 0536  WBC 48.3* 48.3* 46.9*  HGB 9.4* 8.0* 7.6*  HCT 26.6* 22.1* 21.4*  PLT 233 284 281   Coag's  Recent Labs Lab 05/06/15 1600  INR 1.80*   BMET  Recent Labs Lab 05/08/15 0403 05/09/15 0556 05/10/15 0536  NA 132* 131* 133*  K 4.5 4.0 3.4*  CL 105 110 107  CO2 21* 18* 17*  BUN 24* 24* 23*  CREATININE 0.69 0.77 0.64  GLUCOSE 129* 91 133*   Electrolytes  Recent Labs Lab 05/08/15 0403 05/09/15 0556 05/09/15 0955 05/09/15 2100 05/10/15 0536 05/10/15 0807  CALCIUM 7.2* 7.4*  --   --  7.5*  --   MG 2.1 2.1 1.9 2.0 1.8  --   PHOS  --   --  3.5 3.5  --  3.1   Sepsis Markers  Recent Labs Lab 05/08/15 0847 05/08/15 1040  LATICACIDVEN 1.4 2.0   ABG  Recent Labs Lab 05/05/15 1658 05/08/15 0624 05/08/15 0856  PHART 7.251* 7.275* 7.385  PCO2ART 36.7 39.5 31.5*  PO2ART 118.0* 61.0* 156.0*   Liver Enzymes  Recent Labs Lab 05/10/15 0536  AST 29  ALT 12*  ALKPHOS 105  BILITOT 0.3  ALBUMIN <1.0*   Cardiac Enzymes No results for input(s): TROPONINI, PROBNP in the last 168 hours. Glucose  Recent Labs Lab 05/09/15 1205 05/09/15 1554 05/09/15 1932 05/10/15 0010 05/10/15 0425 05/10/15 0758  GLUCAP 97 91 83 135* 116* 123*    Imaging 7/31 CXR > worsening bibasilar infiltrates, RLL collapse; compared to two weeks ago markedly worse   ASSESSMENT / PLAN:  PULMONARY A:  Acute hypoxemia respiratory failure > due to pulm edema vs HCAP with atelectasis > currently not compensating for metabolic acidosis; consider TRALI if no improvement with positive pressure Pleural effusion R base, small Collapse rt base RML, RLL< improved post bronch P:  Weaning now cpap 5 ps5, failed, concern is need peep increase to aerate better despite no true collapse  on pcxr  Concern is need trach will d/w family today pcxr in am  Chest pt maintain could consider cpap 8-10, ps 10 in afternoon  CARDIOVASCULAR Right Arm PICC 7/19>>> A:  Shock- septic, complicated by slow oozing from sacral wound; improved after hydrocortisone but worsening this morning likely due to acidosis Chronic hypotension Relative adrenal insufficiency P:  Continue hydrocortisone as remains on pressors Continue Midodrine 20mg  PO TID Neo for MAP > 55 May need vaso started cvp back 5, bolus  RENAL A:  Hyponatremia >mild Non anion gap metabolic acidosis - likely due to NaCl administration small Anion gap acidosis present of 5 difference P:  Discontinue NS 1/2 NS at 50  Continue following electrolytes, replete as needed K supplement Recheck Lactate  GASTROINTESTINAL OGT A:  Severe malnutrition P:  tf Zofran PRN nausea PPI 40 qd  HEMATOLOGIC A:  Anemia - due to blood loss from sacral wound + peri op bleeding from debridement DVT prevention Persistent leukocytosis P:  Trend CBC, transfuse for hgb < 7  SCD No significant bleeding seen   INFECTIOUS A:  MRSA bacteremia, neg TEE, likely osteomyelitis source E coli UTI  New HCAP likely Once finish abx course, need to remove all lines  P:  BCx2 7/18 >>  MRSA BCx2 7/27 >> Wound/tissue culture 7/28 >> Abscess culture 7/28 >> Bronch bal 7/31 >> abundant gram negative rods  Vancomycin 7/18>>> Aztreonam 7/18>>> 8/1 cipro>>>  Follow gram neg rod Consider longer duration mrsa if bone culture  ENDOCRINE A:  Possible adrenal insufficiency  T1 DM hypoglycemia P:  SSI, dc lantus  Continue hydrocortisone  NEUROLOGIC A: Quad post trauma Post op pain  Acute encephalopathy 7/31 > multifactorial, due to sepsis, anemia, hypotension, pain medications P:  RASS goal -1 PRN fentanyl  Ccm time 30 min   Dad and mom updated  Lavon Paganini. Titus Mould, MD, [place] Pgr: [place]  [facility]

## 2015-05-11 ENCOUNTER — Inpatient Hospital Stay (HOSPITAL_COMMUNITY): Payer: Medicaid Other

## 2015-05-11 DIAGNOSIS — A498 Other bacterial infections of unspecified site: Secondary | ICD-10-CM | POA: Insufficient documentation

## 2015-05-11 DIAGNOSIS — J95851 Ventilator associated pneumonia: Secondary | ICD-10-CM | POA: Insufficient documentation

## 2015-05-11 DIAGNOSIS — Z9889 Other specified postprocedural states: Secondary | ICD-10-CM

## 2015-05-11 DIAGNOSIS — Z86711 Personal history of pulmonary embolism: Secondary | ICD-10-CM | POA: Insufficient documentation

## 2015-05-11 LAB — CBC WITH DIFFERENTIAL/PLATELET
BASOS PCT: 0 % (ref 0–1)
Basophils Absolute: 0 10*3/uL (ref 0.0–0.1)
EOS PCT: 0 % (ref 0–5)
Eosinophils Absolute: 0 10*3/uL (ref 0.0–0.7)
HCT: 19 % — ABNORMAL LOW (ref 39.0–52.0)
Hemoglobin: 6.8 g/dL — CL (ref 13.0–17.0)
Lymphocytes Relative: 4 % — ABNORMAL LOW (ref 12–46)
Lymphs Abs: 0.9 10*3/uL (ref 0.7–4.0)
MCH: 30.1 pg (ref 26.0–34.0)
MCHC: 35.8 g/dL (ref 30.0–36.0)
MCV: 84.1 fL (ref 78.0–100.0)
Monocytes Absolute: 0.6 10*3/uL (ref 0.1–1.0)
Monocytes Relative: 3 % (ref 3–12)
NEUTROS ABS: 19.7 10*3/uL — AB (ref 1.7–7.7)
Neutrophils Relative %: 93 % — ABNORMAL HIGH (ref 43–77)
PLATELETS: 252 10*3/uL (ref 150–400)
RBC: 2.26 MIL/uL — ABNORMAL LOW (ref 4.22–5.81)
RDW: 15.9 % — ABNORMAL HIGH (ref 11.5–15.5)
WBC: 21.3 10*3/uL — AB (ref 4.0–10.5)

## 2015-05-11 LAB — COMPREHENSIVE METABOLIC PANEL
ALT: 13 U/L — ABNORMAL LOW (ref 17–63)
ANION GAP: 7 (ref 5–15)
AST: 23 U/L (ref 15–41)
Alkaline Phosphatase: 95 U/L (ref 38–126)
BUN: 22 mg/dL — ABNORMAL HIGH (ref 6–20)
CALCIUM: 7.7 mg/dL — AB (ref 8.9–10.3)
CHLORIDE: 111 mmol/L (ref 101–111)
CO2: 18 mmol/L — AB (ref 22–32)
Creatinine, Ser: 0.47 mg/dL — ABNORMAL LOW (ref 0.61–1.24)
GFR calc Af Amer: >60 mL/min (ref >60)
GFR calc non Af Amer: >60 mL/min (ref >60)
Glucose, Bld: 178 mg/dL — ABNORMAL HIGH (ref 65–99)
POTASSIUM: 3.7 mmol/L (ref 3.5–5.1)
Sodium: 136 mmol/L (ref 135–145)
TOTAL PROTEIN: 4.1 g/dL — AB (ref 6.5–8.1)
Total Bilirubin: 0.3 mg/dL (ref 0.3–1.2)

## 2015-05-11 LAB — POCT I-STAT 3, ART BLOOD GAS (G3+)
Acid-base deficit: 7 mmol/L — ABNORMAL HIGH (ref 0.0–2.0)
BICARBONATE: 17.9 meq/L — AB (ref 20.0–24.0)
O2 SAT: 99 %
TCO2: 19 mmol/L (ref 0–100)
pCO2 arterial: 32.4 mmHg — ABNORMAL LOW (ref 35.0–45.0)
pH, Arterial: 7.347 — ABNORMAL LOW (ref 7.350–7.450)
pO2, Arterial: 137 mmHg — ABNORMAL HIGH (ref 80.0–100.0)

## 2015-05-11 LAB — GLUCOSE, CAPILLARY
GLUCOSE-CAPILLARY: 140 mg/dL — AB (ref 65–99)
GLUCOSE-CAPILLARY: 220 mg/dL — AB (ref 65–99)
Glucose-Capillary: 182 mg/dL — ABNORMAL HIGH (ref 65–99)
Glucose-Capillary: 254 mg/dL — ABNORMAL HIGH (ref 65–99)
Glucose-Capillary: 166 mg/dL — ABNORMAL HIGH (ref 65–99)
Glucose-Capillary: 241 mg/dL — ABNORMAL HIGH (ref 65–99)
Glucose-Capillary: 256 mg/dL — ABNORMAL HIGH (ref 65–99)
Glucose-Capillary: 182 mg/dL — ABNORMAL HIGH (ref 65–99)

## 2015-05-11 LAB — HEMOGLOBIN A1C
HEMOGLOBIN A1C: 5.9 % — AB (ref 4.8–5.6)
MEAN PLASMA GLUCOSE: 123 mg/dL

## 2015-05-11 LAB — PREPARE RBC (CROSSMATCH)

## 2015-05-11 LAB — PHOSPHORUS
PHOSPHORUS: 2.7 mg/dL (ref 2.5–4.6)
Phosphorus: 2.7 mg/dL (ref 2.5–4.6)

## 2015-05-11 LAB — MAGNESIUM
MAGNESIUM: 1.6 mg/dL — AB (ref 1.7–2.4)
Magnesium: 1.6 mg/dL — ABNORMAL LOW (ref 1.7–2.4)
Magnesium: 1.4 mg/dL — ABNORMAL LOW (ref 1.7–2.4)

## 2015-05-11 MED ORDER — GENTAMICIN SULFATE 40 MG/ML IJ SOLN
7.0000 mg/kg | INTRAVENOUS | Status: DC
  Administered 2015-05-11: 520 mg via INTRAVENOUS
  Filled 2015-05-11: qty 13

## 2015-05-11 MED ORDER — TIGECYCLINE 50 MG IV SOLR
50.0000 mg | Freq: Two times a day (BID) | INTRAVENOUS | Status: DC
  Administered 2015-05-12 – 2015-05-20 (×17): 50 mg via INTRAVENOUS
  Filled 2015-05-11 (×18): qty 100

## 2015-05-11 MED ORDER — MIDAZOLAM HCL 2 MG/2ML IJ SOLN
INTRAMUSCULAR | Status: AC
  Administered 2015-05-12: 2 mg
  Filled 2015-05-11: qty 2

## 2015-05-11 MED ORDER — INSULIN ASPART 100 UNIT/ML ~~LOC~~ SOLN
8.0000 [IU] | Freq: Once | SUBCUTANEOUS | Status: AC
  Administered 2015-05-11: 8 [IU] via INTRAVENOUS

## 2015-05-11 MED ORDER — TIGECYCLINE 50 MG IV SOLR
100.0000 mg | Freq: Once | INTRAVENOUS | Status: AC
  Administered 2015-05-11: 100 mg via INTRAVENOUS
  Filled 2015-05-11: qty 100

## 2015-05-11 MED ORDER — HYDROCORTISONE NA SUCCINATE PF 100 MG IJ SOLR
25.0000 mg | Freq: Two times a day (BID) | INTRAMUSCULAR | Status: DC
  Administered 2015-05-11 – 2015-05-13 (×4): 25 mg via INTRAVENOUS
  Filled 2015-05-11 (×4): qty 0.5

## 2015-05-11 MED ORDER — SODIUM CHLORIDE 0.9 % IV BOLUS (SEPSIS)
500.0000 mL | Freq: Once | INTRAVENOUS | Status: AC
  Administered 2015-05-11: 500 mL via INTRAVENOUS

## 2015-05-11 MED ORDER — SODIUM CHLORIDE 0.9 % IV SOLN
Freq: Once | INTRAVENOUS | Status: AC
  Administered 2015-05-11: 1000 mL via INTRAVENOUS

## 2015-05-11 MED ORDER — WHITE PETROLATUM GEL
Status: AC
  Administered 2015-05-11: 0.2
  Filled 2015-05-11: qty 1

## 2015-05-11 MED ORDER — INSULIN ASPART 100 UNIT/ML ~~LOC~~ SOLN
0.0000 [IU] | SUBCUTANEOUS | Status: DC
  Administered 2015-05-11: 3 [IU] via SUBCUTANEOUS
  Administered 2015-05-12 – 2015-05-15 (×8): 2 [IU] via SUBCUTANEOUS
  Administered 2015-05-15: 3 [IU] via SUBCUTANEOUS
  Administered 2015-05-15: 5 [IU] via SUBCUTANEOUS
  Administered 2015-05-15 – 2015-05-16 (×5): 3 [IU] via SUBCUTANEOUS
  Administered 2015-05-16 (×2): 5 [IU] via SUBCUTANEOUS
  Administered 2015-05-16 – 2015-05-17 (×2): 3 [IU] via SUBCUTANEOUS
  Administered 2015-05-17: 5 [IU] via SUBCUTANEOUS
  Administered 2015-05-17 (×2): 2 [IU] via SUBCUTANEOUS
  Administered 2015-05-17: 3 [IU] via SUBCUTANEOUS
  Administered 2015-05-18 – 2015-05-19 (×5): 5 [IU] via SUBCUTANEOUS
  Administered 2015-05-19 (×3): 3 [IU] via SUBCUTANEOUS
  Administered 2015-05-19: 5 [IU] via SUBCUTANEOUS
  Administered 2015-05-20 – 2015-05-21 (×6): 3 [IU] via SUBCUTANEOUS
  Administered 2015-05-21: 2 [IU] via SUBCUTANEOUS
  Administered 2015-05-21 (×2): 5 [IU] via SUBCUTANEOUS
  Administered 2015-05-21 – 2015-05-22 (×2): 3 [IU] via SUBCUTANEOUS
  Administered 2015-05-22: 5 [IU] via SUBCUTANEOUS
  Administered 2015-05-22: 2 [IU] via SUBCUTANEOUS
  Administered 2015-05-22: 3 [IU] via SUBCUTANEOUS
  Administered 2015-05-22 – 2015-05-23 (×2): 5 [IU] via SUBCUTANEOUS
  Administered 2015-05-23: 3 [IU] via SUBCUTANEOUS
  Administered 2015-05-23: 2 [IU] via SUBCUTANEOUS
  Administered 2015-05-23: 3 [IU] via SUBCUTANEOUS
  Administered 2015-05-23: 8 [IU] via SUBCUTANEOUS
  Administered 2015-05-24: 3 [IU] via SUBCUTANEOUS
  Administered 2015-05-24: 2 [IU] via SUBCUTANEOUS
  Administered 2015-05-24 (×2): 5 [IU] via SUBCUTANEOUS
  Administered 2015-05-24: 3 [IU] via SUBCUTANEOUS
  Administered 2015-05-25 (×5): 5 [IU] via SUBCUTANEOUS
  Administered 2015-05-25 – 2015-05-26 (×2): 3 [IU] via SUBCUTANEOUS
  Administered 2015-05-26: 2 [IU] via SUBCUTANEOUS
  Administered 2015-05-26 – 2015-05-27 (×2): 3 [IU] via SUBCUTANEOUS
  Administered 2015-05-27: 2 [IU] via SUBCUTANEOUS
  Administered 2015-05-27: 3 [IU] via SUBCUTANEOUS
  Administered 2015-05-28: 8 [IU] via SUBCUTANEOUS
  Administered 2015-05-28: 5 [IU] via SUBCUTANEOUS
  Administered 2015-05-28 (×2): 3 [IU] via SUBCUTANEOUS
  Administered 2015-05-28: 5 [IU] via SUBCUTANEOUS
  Administered 2015-05-28: 8 [IU] via SUBCUTANEOUS
  Administered 2015-05-29 (×6): 3 [IU] via SUBCUTANEOUS
  Administered 2015-05-30: 5 [IU] via SUBCUTANEOUS
  Administered 2015-05-30 (×2): 8 [IU] via SUBCUTANEOUS
  Administered 2015-05-30: 5 [IU] via SUBCUTANEOUS
  Administered 2015-05-30 (×2): 3 [IU] via SUBCUTANEOUS
  Administered 2015-05-31: 2 [IU] via SUBCUTANEOUS
  Administered 2015-05-31: 5 [IU] via SUBCUTANEOUS
  Administered 2015-05-31: 8 [IU] via SUBCUTANEOUS
  Administered 2015-05-31: 5 [IU] via SUBCUTANEOUS
  Administered 2015-05-31: 3 [IU] via SUBCUTANEOUS
  Administered 2015-05-31 (×2): 5 [IU] via SUBCUTANEOUS
  Administered 2015-06-01 (×2): 3 [IU] via SUBCUTANEOUS

## 2015-05-11 MED ORDER — INSULIN ASPART 100 UNIT/ML ~~LOC~~ SOLN: 0.0000 [IU] | Freq: Three times a day (TID) | SUBCUTANEOUS | Status: DC

## 2015-05-11 MED ORDER — SODIUM BICARBONATE 8.4 % IV SOLN
INTRAVENOUS | Status: DC
  Administered 2015-05-11 – 2015-05-12 (×2): via INTRAVENOUS
  Filled 2015-05-11 (×2): qty 150

## 2015-05-11 MED ORDER — FENTANYL CITRATE (PF) 100 MCG/2ML IJ SOLN
INTRAMUSCULAR | Status: AC
  Administered 2015-05-12: 100 ug
  Filled 2015-05-11: qty 2

## 2015-05-11 NOTE — Progress Notes (Signed)
CRITICAL VALUE ALERT  Critical value received:  Hemoglobin 6.8  Date of notification:  05/11/2015  Time of notification:  06:30  Critical value read back:Yes.    Nurse who received alert:  Fayrene Helper  MD notified (1st page):  Dr. Halford Chessman  Time of first page:  06:40  MD notified (2nd page):  Time of second page:  Responding MD:  Dr. Halford Chessman  Time MD responded:  06:40

## 2015-05-11 NOTE — Procedures (Signed)
Extubation Procedure Note  Patient Details:   Name: Jason [name] DOB: 04/04/1986 MRN: [id number]   Airway Documentation:     Evaluation  O2 sats: stable throughout Complications: No apparent complications Patient did tolerate procedure well. Bilateral Breath Sounds: Clear, Diminished Suctioning: Airway Yes  PT was extubated to 4L [name]. Stats are stable  PT is resting comfortably   [name], [name] 05/11/2015, 4:38 PM

## 2015-05-11 NOTE — Progress Notes (Signed)
Pt O2 sats dropping in low 80's on 6L [place], Venti mask applied by respiratory 02 sats remain in the low 80's. Nonrebreather mask applied. Dr. [place] Bells Notified.

## 2015-05-11 NOTE — Progress Notes (Signed)
PULMONARY / CRITICAL CARE MEDICINE   Name: Jason [name] MRN: [id number] DOB: [birth year]    ADMISSION DATE: 04/25/2015 TODAY'S DATE: 05/10/2015  REFERRING MD : TRH  CHIEF COMPLAINT: Hypotension  INITIAL PRESENTATION: 29 year old male quad with MRSA bacteremia from multiple sources. Concern for endocarditis. Wounds have been checked and followed by general surgery and ortho. Patient has history of hypotension and concern for septic shock and levophed was started and PCCM consulted. 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 Now with possible HCAP.   SIGNIFICANT EVENTS: 7/28 >> OR for debridement of osteomyelitis and abscess cx with staph. Positive MRSA and strep blood cx 7/28 >> Reconsulted for extensive post op bleeding and hypotension 7/31 >> worsening respiratory status, acidosis, hypoxemia, hypotension overnight  STUDIES: 7/28 TEE >> NO vegetation, normal LV function  7/31 Bronch >> HCAP, extensive mucous plug 8/2 bronch - no sig obstruction 8/2- family does not want trach at this point even though is recommended  SUBJECTIVE: sedated  VITAL SIGNS: Temp:  [97.6 F (36.4 C)-98.3 F (36.8 C)] 97.7 F (36.5 C) (08/03 0510) Pulse Rate:  [78-119] 78 (08/03 0700) Resp:  [16-23] 19 (08/03 0700) BP: (81-131)/(46-88) 131/88 mmHg (08/03 0700) SpO2:  [96 %-100 %] 100 % (08/03 0700) FiO2 (%):  [40 %] 40 % (08/03 0000) Weight:  [74.481 kg (164 lb 3.2 oz)] 74.481 kg (164 lb 3.2 oz) (08/03 0451) HEMODYNAMICS: CVP:  [5 mmHg-7 mmHg] 7 mmHg VENTILATOR SETTINGS: Vent Mode:  [-] PRVC FiO2 (%):  [40 %] 40 % Set Rate:  [20 bmp] 20 bmp Vt Set:  [550 mL] 550 mL PEEP:  [8 cmH20] 8 cmH20 Plateau Pressure:  [25 cmH20-29 cmH20] 25 cmH20 INTAKE / OUTPUT:  Intake/Output Summary (Last 24 hours) at 05/11/15 0802 Last data filed at 05/11/15 0600  Gross per 24 hour  Intake 3943.49 ml  Output   1650 ml  Net 2293.49 ml    PHYSICAL  EXAMINATION:  Gen: chronically ill appearing, rass -3 HENT: ett PULM: ronchi improved to clear CV: RRR, no mgr, edema arms, feet GI: BS+, soft, nontender Derm: chronic breakdown, sacral wound stage 4 Neuro:  rass -3, follows commands   LABS:  CBC  Recent Labs Lab 05/09/15 0556 05/10/15 0536 05/11/15 0511  WBC 48.3* 46.9* 21.3*  HGB 8.0* 7.6* 6.8*  HCT 22.1* 21.4* 19.0*  PLT 284 281 252   Coag's  Recent Labs Lab 05/06/15 1600  INR 1.80*   BMET  Recent Labs Lab 05/09/15 0556 05/10/15 0536 05/11/15 0511  NA 131* 133* 136  K 4.0 3.4* 3.7  CL 110 107 111  CO2 18* 17* 18*  BUN 24* 23* 22*  CREATININE 0.77 0.64 0.47*  GLUCOSE 91 133* 178*   Electrolytes  Recent Labs Lab 05/09/15 0556  05/09/15 2100 05/10/15 0536 05/10/15 0807 05/10/15 2153 05/11/15 0511  CALCIUM 7.4*  --   --  7.5*  --   --  7.7*  MG 2.1  < > 2.0 1.8  --  1.7 1.6*  PHOS  --   < > 3.5  --  3.1 2.8  --   < > = values in this interval not displayed. Sepsis Markers  Recent Labs Lab 05/08/15 0847 05/08/15 1040 05/10/15 1443  LATICACIDVEN 1.4 2.0 2.4*   ABG  Recent Labs Lab 05/05/15 1658 05/08/15 0624 05/08/15 0856  PHART 7.251* 7.275* 7.385  PCO2ART 36.7 39.5 31.5*  PO2ART 118.0* 61.0* 156.0*   Liver Enzymes  Recent Labs Lab 05/10/15 0536 05/11/15 0511  AST 29 23  ALT 12* 13*  ALKPHOS 105 95  BILITOT 0.3 0.3  ALBUMIN <1.0* <1.0*   Cardiac Enzymes No results for input(s): TROPONINI, PROBNP in the last 168 hours. Glucose  Recent Labs Lab 05/10/15 0425 05/10/15 0758 05/10/15 1217 05/10/15 1553 05/10/15 1951 05/11/15 0350  GLUCAP 116* 123* 153* 136* 148* 140*    Imaging 8/3 CXR > IMproved pcxr infiltrates bilateral, ett wnl   ASSESSMENT / PLAN:  PULMONARY A:  Acute hypoxemia respiratory failure > due to pulm edema vs HCAP with atelectasis > currently not compensating for metabolic acidosis; consider TRALI if no improvement with positive  pressure Pleural effusion R base, small Collapse rt base RML, RLL< improved post bronch P:  Repeat bronch reassruing and lavage may have improved aeration seen now Peep to 5 now Was on 100% this am , unclear why, r reduce If sats wnl after then cpap5 ps 5 goal 20 min Had a long conversation with mom. Stressed the degree of his wound (almost into pelvis), poor outcome prognosis per plastics (i agree), PNA, quad status - requires trach if aggressive measures sought, high risk death without trach - she accepts risk and accepts  extubation trial - all risks described pcxr in am   CARDIOVASCULAR Right Arm PICC 7/19>>> A:  Shock resolved Relative adrenal insufficiency P:  Reduce steroids in setting wound heeling needs and off pressors Continue Midodrine 20mg  PO TID Neo for MAP > 55, off  RENAL A:  Hyponatremia >mild Non anion gap metabolic acidosis - likely due to NaCl administration small Anion gap acidosis present of 5 difference P:  Add bicarb for NONAG  Recheck Lactate concerning, wound source remains, no further surgical options per plastics, maintain abx, bolus  GASTROINTESTINAL OGT A:  Severe malnutrition P:  Tf hold for weaning Zofran PRN nausea PPI 40 qd  HEMATOLOGIC A:  Anemia - due to blood loss from sacral wound + peri op bleeding from debridement, dilution DVT prevention Persistent leukocytosis MAy 2016 PE P:  Trend CBC in am  SCD Transfuse 1 unit Would not restart anticoagulation with PRBC needs, anemia Ensure has filter in past?, if not consider placement  INFECTIOUS A:  MRSA bacteremia, neg TEE, likely osteomyelitis source E coli UTI  New HCAP likely Once finish abx course, need to remove all lines  MDR acinatobacter P:  BCx2 7/18 >>  MRSA BCx2 7/27 >> Wound/tissue culture 7/28 >> Abscess culture 7/28 >> Bronch bal 7/31 >> MDR acint   Vancomycin 7/18>>> Aztreonam 7/18>>>8/3 8/1 cipro>>>8/3 8/3 unasyn>>>  Change to  unasyn Isolation important  ENDOCRINE A:  Possible adrenal insufficiency  T1 DM hypoglycemia P:  SSI reduce hydrocortisone  NEUROLOGIC A: Quad post trauma Post op pain  Acute encephalopathy 7/31 > multifactorial, due to sepsis, anemia, hypotension, pain medications P:  RASS goal 0 PRN fentanyl WUA  Ccm time 30 min   Dad and mom updated 8/2, driving care, see discussion above  Lavon Paganini. Titus Mould, MD, [place] Pgr: [facility]

## 2015-05-11 NOTE — Progress Notes (Addendum)
ANTIBIOTIC CONSULT NOTE - INITIAL  Pharmacy Consult for gentamicin and tigecycline Indication: pneumonia  Allergies  Allergen Reactions  . Cefuroxime Axetil Anaphylaxis  . Ertapenem Other (See Comments)    Rash and confusion  . Morphine And Related Other (See Comments)    Changed mental status, confusion, headache, visual hallucination  . Penicillins Anaphylaxis and Other (See Comments)    ?can take amoxicillin?  . Sulfa Antibiotics Anaphylaxis, Shortness Of Breath and Other (See Comments)  . Tessalon [Benzonatate] Anaphylaxis  . Shellfish Allergy Itching and Other (See Comments)    Took benadryl to alleviate reaction  . Miripirium Rash and Other (See Comments)    Change in mental status    Patient Measurements: Height: 5\' 8"  (172.7 cm) Weight: 164 lb 3.2 oz (74.481 kg) IBW/kg (Calculated) : 68.4 TBW: 74.5 kg  Vital Signs: Temp: 97.7 F (36.5 C) (08/03 0852) Temp Source: Oral (08/03 0852) BP: 89/52 mmHg (08/03 0900) Pulse Rate: 102 (08/03 0900) Intake/Output from previous day: 08/02 0701 - 08/03 0700 In: 4198.5 [I.V.:1608.5; NG/GT:1540; IV Piggyback:1050] Out: 1650 [Urine:1650] Intake/Output from this shift: Total I/O In: 622.9 [I.V.:122.9; IV Piggyback:500] Out: -   Labs:  Recent Labs  05/09/15 0556 05/10/15 0536 05/11/15 0511  WBC 48.3* 46.9* 21.3*  HGB 8.0* 7.6* 6.8*  PLT 284 281 252  CREATININE 0.77 0.64 0.47*   Estimated Creatinine Clearance: 130.6 mL/min (by C-G formula based on Cr of 0.47). No results for input(s): VANCOTROUGH, VANCOPEAK, VANCORANDOM, GENTTROUGH, GENTPEAK, GENTRANDOM, TOBRATROUGH, TOBRAPEAK, TOBRARND, AMIKACINPEAK, AMIKACINTROU, AMIKACIN in the last 72 hours.   Microbiology: Recent Results (from the past 720 hour(s))  Urine culture     Status: None   Collection Time: 04/25/15  4:35 PM  Result Value Ref Range Status   Specimen Description URINE, CLEAN CATCH  Final   Special Requests Normal  Final   Culture   Final    >=100,000  COLONIES/mL ESCHERICHIA COLI AZTREONAM SENSITIVE ZONE 32MM Performed at [facility] Performed at [facility]    Report Status 05/02/2015 FINAL  Final   Organism ID, Bacteria ESCHERICHIA COLI  Final      Susceptibility   Escherichia coli - MIC*    AMPICILLIN <=2 SENSITIVE Sensitive     CEFAZOLIN <=4 SENSITIVE Sensitive     CEFTRIAXONE <=1 SENSITIVE Sensitive     CIPROFLOXACIN >=4 RESISTANT Resistant     GENTAMICIN <=1 SENSITIVE Sensitive     IMIPENEM <=0.25 SENSITIVE Sensitive     NITROFURANTOIN <=16 SENSITIVE Sensitive     TRIMETH/SULFA <=20 SENSITIVE Sensitive     AMPICILLIN/SULBACTAM <=2 SENSITIVE Sensitive     PIP/TAZO <=4 SENSITIVE Sensitive     * >=100,000 COLONIES/mL ESCHERICHIA COLI  Blood culture (routine x 2)     Status: None   Collection Time: 04/25/15  5:00 PM  Result Value Ref Range Status   Specimen Description BLOOD RIGHT ANTECUBITAL  Final   Special Requests BOTTLES DRAWN AEROBIC AND ANAEROBIC 5 CC EA  Final   Culture  Setup Time   Final    GRAM POSITIVE COCCI IN CLUSTERS Gram Stain Report Called to,Read Back By and Verified With: Elveria Rising RN 17:30 04/26/15 ([email or web address]) IN BOTH AEROBIC AND ANAEROBIC BOTTLES    Culture   Final    METHICILLIN RESISTANT STAPHYLOCOCCUS AUREUS MICROAEROPHILIC STREPTOCOCCI Standardized susceptibility testing for this organism is not available. Performed at [facility]    Report Status 04/30/2015 FINAL  Final   Organism ID, Bacteria METHICILLIN RESISTANT STAPHYLOCOCCUS AUREUS  Final  Susceptibility   Methicillin resistant staphylococcus aureus - MIC*    CIPROFLOXACIN >=8 RESISTANT Resistant     ERYTHROMYCIN >=8 RESISTANT Resistant     GENTAMICIN <=0.5 SENSITIVE Sensitive     OXACILLIN >=4 RESISTANT Resistant     TETRACYCLINE <=1 SENSITIVE Sensitive     VANCOMYCIN <=0.5 SENSITIVE Sensitive     TRIMETH/SULFA >=320 RESISTANT Resistant     CLINDAMYCIN >=8 RESISTANT Resistant     RIFAMPIN <=0.5 SENSITIVE  Sensitive     Inducible Clindamycin NEGATIVE Sensitive     * METHICILLIN RESISTANT STAPHYLOCOCCUS AUREUS  Blood culture (routine x 2)     Status: None   Collection Time: 04/25/15  5:16 PM  Result Value Ref Range Status   Specimen Description BLOOD BLOOD RIGHT FOREARM  Final   Special Requests BOTTLES DRAWN AEROBIC AND ANAEROBIC 5 CC EA  Final   Culture  Setup Time   Final    GRAM POSITIVE COCCI IN CLUSTERS CRITICAL RESULT CALLED TO, READ BACK BY AND VERIFIED WITH: S GRAHAM 04/26/15 @ 51 M VESTAL IN BOTH AEROBIC AND ANAEROBIC BOTTLES    Culture   Final    METHICILLIN RESISTANT STAPHYLOCOCCUS AUREUS SUSCEPTIBILITIES PERFORMED ON PREVIOUS CULTURE WITHIN THE LAST 5 DAYS. MICROAEROPHILIC STREPTOCOCCI Standardized susceptibility testing for this organism is not available. Performed at [facility]    Report Status 05/03/2015 FINAL  Final  MRSA PCR Screening     Status: Abnormal   Collection Time: 04/25/15  6:55 PM  Result Value Ref Range Status   MRSA by PCR POSITIVE (A) NEGATIVE Final    Comment:        The GeneXpert MRSA Assay (FDA approved for NASAL specimens only), is one component of a comprehensive MRSA colonization surveillance program. It is not intended to diagnose MRSA infection nor to guide or monitor treatment for MRSA infections. RESULT CALLED TO, READ BACK BY AND VERIFIED WITH: Sharyn Lull REEVES,RN [id number] @ 2043 BY J SCOTTON   Culture, blood (routine x 2)     Status: None   Collection Time: 04/28/15  1:20 PM  Result Value Ref Range Status   Specimen Description BLOOD LEFT ARM  Final   Special Requests IN PEDIATRIC BOTTLE 1CC  Final   Culture  Setup Time   Final    GRAM POSITIVE COCCI IN CLUSTERS AEROBIC BOTTLE ONLY CRITICAL RESULT CALLED TO, READ BACK BY AND VERIFIED WITH: Hetty Blend RN 2222 04/30/15 A BROWNING    Culture   Final    MICROAEROPHILIC STREPTOCOCCI Standardized susceptibility testing for this organism is not available. Performed at [facility]    Report Status 05/03/2015 FINAL  Final  Culture, blood (routine x 2)     Status: None   Collection Time: 04/28/15  5:50 PM  Result Value Ref Range Status   Specimen Description BLOOD PICC LINE  Final   Special Requests BOTTLES DRAWN AEROBIC AND ANAEROBIC 5CC EACH  Final   Culture   Final    NO GROWTH 5 DAYS Performed at [facility]    Report Status 05/03/2015 FINAL  Final  Culture, blood (routine x 2)     Status: None   Collection Time: 05/04/15  6:30 PM  Result Value Ref Range Status   Specimen Description BLOOD PICC LINE  Final   Special Requests BOTTLES DRAWN AEROBIC AND ANAEROBIC 5ML  Final   Culture   Final    NO GROWTH 5 DAYS Performed at [facility]    Report Status 05/09/2015  FINAL  Final  Culture, blood (routine x 2)     Status: None   Collection Time: 05/04/15  6:30 PM  Result Value Ref Range Status   Specimen Description BLOOD PICC LINE  Final   Special Requests BOTTLES DRAWN AEROBIC AND ANAEROBIC 5ML  Final   Culture   Final    NO GROWTH 5 DAYS Performed at [facility]    Report Status 05/09/2015 FINAL  Final  Anaerobic culture     Status: None   Collection Time: 05/05/15  1:01 PM  Result Value Ref Range Status   Specimen Description ABSCESS  Final   Special Requests SPEC A ON SWAB RIGHT PELVIC ABSCESS  Final   Gram Stain   Final    FEW WBC PRESENT, PREDOMINANTLY MONONUCLEAR NO SQUAMOUS EPITHELIAL CELLS SEEN FEW GRAM POSITIVE COCCI IN PAIRS IN CLUSTERS Performed at [facility]    Culture   Final    NO ANAEROBES ISOLATED Performed at [facility]    Report Status 05/10/2015 FINAL  Final  Culture, routine-abscess     Status: None   Collection Time: 05/05/15  1:01 PM  Result Value Ref Range Status   Specimen Description ABSCESS  Final   Special Requests SPEC A ON SWAB RIGHT PELVIC ABSCESS  Final   Gram Stain   Final    FEW WBC PRESENT, PREDOMINANTLY MONONUCLEAR NO SQUAMOUS EPITHELIAL CELLS  SEEN FEW GRAM POSITIVE COCCI IN PAIRS IN CLUSTERS Performed at [facility]    Culture   Final    MODERATE METHICILLIN RESISTANT STAPHYLOCOCCUS AUREUS Note: RIFAMPIN AND GENTAMICIN SHOULD NOT BE USED AS SINGLE DRUGS FOR TREATMENT OF STAPH INFECTIONS. CRITICAL RESULT CALLED TO, READ BACK BY AND VERIFIED WITH: IREKIA B. 8/1 @216  BY REAMM Performed at [facility]    Report Status 05/09/2015 FINAL  Final   Organism ID, Bacteria METHICILLIN RESISTANT STAPHYLOCOCCUS AUREUS  Final      Susceptibility   Methicillin resistant staphylococcus aureus - MIC*    CLINDAMYCIN >=8 RESISTANT Resistant     ERYTHROMYCIN >=8 RESISTANT Resistant     GENTAMICIN <=0.5 SENSITIVE Sensitive     LEVOFLOXACIN >=8 RESISTANT Resistant     OXACILLIN >=4 RESISTANT Resistant     RIFAMPIN <=0.5 SENSITIVE Sensitive     TRIMETH/SULFA >=320 RESISTANT Resistant     VANCOMYCIN 1 SENSITIVE Sensitive     TETRACYCLINE <=1 SENSITIVE Sensitive     * MODERATE METHICILLIN RESISTANT STAPHYLOCOCCUS AUREUS  Tissue culture     Status: None   Collection Time: 05/05/15  1:13 PM  Result Value Ref Range Status   Specimen Description TISSUE  Final   Special Requests SPEC B IN CUP BONE RIGHT ISCHIUM  Final   Gram Stain   Final    RARE WBC PRESENT, PREDOMINANTLY MONONUCLEAR NO ORGANISMS SEEN Performed at [facility]    Culture   Final    FEW METHICILLIN RESISTANT STAPHYLOCOCCUS AUREUS Note: RIFAMPIN AND GENTAMICIN SHOULD NOT BE USED AS SINGLE DRUGS FOR TREATMENT OF STAPH INFECTIONS. CRITICAL RESULT CALLED TO, READ BACK BY AND VERIFIED WITH: IREKIA B. 8/1 @216  BY REAMM Performed at [facility]    Report Status 05/09/2015 FINAL  Final   Organism ID, Bacteria METHICILLIN RESISTANT STAPHYLOCOCCUS AUREUS  Final      Susceptibility   Methicillin resistant staphylococcus aureus - MIC*    CLINDAMYCIN >=8 RESISTANT Resistant     ERYTHROMYCIN >=8 RESISTANT Resistant     GENTAMICIN <=0.5 SENSITIVE  Sensitive  LEVOFLOXACIN >=8 RESISTANT Resistant     OXACILLIN >=4 RESISTANT Resistant     RIFAMPIN <=0.5 SENSITIVE Sensitive     TRIMETH/SULFA >=320 RESISTANT Resistant     VANCOMYCIN 1 SENSITIVE Sensitive     TETRACYCLINE <=1 SENSITIVE Sensitive     * FEW METHICILLIN RESISTANT STAPHYLOCOCCUS AUREUS  Culture, respiratory (NON-Expectorated)     Status: None (Preliminary result)   Collection Time: 05/08/15  8:36 AM  Result Value Ref Range Status   Specimen Description TRACHEAL ASPIRATE  Final   Special Requests Normal  Final   Gram Stain   Final    RARE WBC PRESENT, PREDOMINANTLY PMN NO SQUAMOUS EPITHELIAL CELLS SEEN FEW GRAM NEGATIVE RODS RARE GRAM POSITIVE COCCI IN PAIRS RARE YEAST    Culture   Final    ABUNDANT ACINETOBACTER CALCOACETICUS/BAUMANNII COMPLEX Note: TIGECYCLINE PENDING COLISTIN PENDING Performed at [facility]    Report Status PENDING  Incomplete   Organism ID, Bacteria ACINETOBACTER CALCOACETICUS/BAUMANNII COMPLEX  Final      Susceptibility   Acinetobacter calcoaceticus/baumannii complex - MIC*    AMPICILLIN >=32 RESISTANT Resistant     AMPICILLIN/SULBACTAM 4 SENSITIVE Sensitive     CEFAZOLIN >=64 RESISTANT Resistant     CEFTAZIDIME >=64 RESISTANT Resistant     CEFTRIAXONE >=64 RESISTANT Resistant     CIPROFLOXACIN >=4 RESISTANT Resistant     GENTAMICIN <=1 SENSITIVE Sensitive     IMIPENEM 0.5 SENSITIVE Sensitive     PIP/TAZO >=128 RESISTANT Resistant     TOBRAMYCIN <=1 SENSITIVE Sensitive     TRIMETH/SULFA >=320 RESISTANT Resistant     * ABUNDANT ACINETOBACTER CALCOACETICUS/BAUMANNII COMPLEX  Culture, blood (routine x 2)     Status: None   Collection Time: 05/10/15  3:05 PM  Result Value Ref Range Status   Specimen Description Blood  Final   Special Requests Normal  Final   Culture  Setup Time TEST WILL BE CREDITED TEST CANCELLED PER RN   Final   Culture TEST WILL BE CREDITED TEST CANCELLED PER RN   Final   Report Status 05/10/2015 FINAL   Final    Medical History: Past Medical History  Diagnosis Date  . GERD (gastroesophageal reflux disease)   . Asthma   . Hx MRSA infection     on face  . Gastroparesis   . Diabetic neuropathy   . Seizures   . Family history of anesthesia complication     Pt mother can't have epidural procedures  . Dysrhythmia   . Pneumonia   . Arthritis   . Fibromyalgia   . 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 Type I diabetes mellitus     sees Dr. Loanne Drilling   . Syncope 02/16/2015    Assessment: 28yoM quad admitted 04/25/2015 with MRSA bacteremia from multiple sources, now found to have Acinetobacter sp. In tracheal aspirate. SCr 0.47, good UOP (0.9cc/kg/hr), WBC 21.3, afebrile. Will start treatment for HCAP with tigecycline and gentamicin.  Goal of Therapy:  Eradication of infection.  Plan: - Will start tigecycline with 100 mg LD, followed by 50mg  q12h - Will start gentamicin 520 mg (~7mg /kg) q24h - F/u 10-hr gent level at 2100 - Monitor SCr, UOP, CBC  Dimitri Ped, PharmD. Clinical Pharmacist Resident Pager: [phone]

## 2015-05-11 NOTE — Progress Notes (Signed)
[facility] Infectious Disease    Date of Admission:  04/25/2015   Total days of antibiotics 17 Day 17 Vanc Day 1 Tigecycline        Day 1 Gentamicin  Active Problems:   Type 1 diabetes, uncontrolled, with neuropathy   Spinal cord infarction (history of)   Sepsis   Stage IV decubitus ulcer   Urinary tract infectious disease   HCAP (healthcare-associated pneumonia)   Hypotension   Abdominopelvic abscess   Hemorrhagic shock   Acute blood loss anemia   Edema   MRSA bacteremia   Collapse, lung   . sodium chloride   Intravenous Once  . albuterol  2.5 mg Nebulization Q4H  . antiseptic oral rinse  7 mL Mouth Rinse QID  . baclofen  10 mg Oral BID WC  . baclofen  20 mg Oral QHS  . chlorhexidine  15 mL Mouth Rinse BID  . gentamicin  7 mg/kg Intravenous Q24H  . hydrocortisone sodium succinate  25 mg Intravenous Q12H  . insulin aspart  2-6 Units Subcutaneous 6 times per day  . loratadine  10 mg Oral QHS  . midodrine  20 mg Oral TID WC  . mirtazapine  7.5 mg Oral QHS  . pantoprazole sodium  40 mg Per Tube Daily  . pregabalin  100 mg Oral TID AC  . pregabalin  200 mg Oral QHS  . sodium chloride  10-40 mL Intracatheter Q12H  . tigecycline (TYGACIL) IVPB  100 mg Intravenous Once   Followed by  . [START ON 05/12/2015] tigecycline (TYGACIL) IVPB  50 mg Intravenous Q12H  . vancomycin  750 mg Intravenous Q48H    Subjective: Jason [name] is drowsy but interactive on exam. Shakes his head yes when asked if he is in pain.   Review of Systems: Intubated.   Past Medical History  Diagnosis Date  . GERD (gastroesophageal reflux disease)   . Asthma   . Hx MRSA infection     on face  . Gastroparesis   . Diabetic neuropathy   . Seizures   . Family history of anesthesia complication     Pt mother can't  have epidural procedures  . Dysrhythmia   . Pneumonia   . Arthritis   . Fibromyalgia   . 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 Type I diabetes mellitus     sees Dr. Loanne Drilling   . Syncope 02/16/2015    History  Substance Use Topics  . Smoking status: Former Smoker -- 1.00 packs/day for 12 years    Types: Cigars, Cigarettes    Quit date: 09/07/2014  . Smokeless tobacco: Never Used  . Alcohol Use: No    Family History  Problem Relation Age of Onset  . Diabetes Father   . Hypertension Father   . Asthma      fhx  . Hypertension      fhx  . Stroke      fhx  . Heart disease Mother    Allergies  Allergen Reactions  . Cefuroxime Axetil Anaphylaxis  . Ertapenem Other (See Comments)    Rash and confusion  . Morphine And Related Other (See Comments)    Changed mental status, confusion, headache, visual hallucination  . Penicillins Anaphylaxis and Other (See Comments)    ?can take amoxicillin?  . Sulfa Antibiotics Anaphylaxis, Shortness Of Breath and Other (See Comments)  . Tessalon [Benzonatate] Anaphylaxis  . Shellfish Allergy Itching and  Other (See Comments)    Took benadryl to alleviate reaction  . Miripirium Rash and Other (See Comments)    Change in mental status    OBJECTIVE: Blood pressure 88/50, pulse 89, temperature 97.7 F (36.5 C), temperature source Oral, resp. rate 13, height 5\' 8"  (1.727 m), weight 164 lb 3.2 oz (74.481 kg), SpO2 100 %. General: Drowsy on exam but awakens to voice and tracks. Afebrile. Skin: Dressings to back and foot C/D/I.  Lungs: Course lung sounds with expiratory wheeze to left upper lobe.  CV: Normal heart sounds. +4 edema all extremities.  Abdomen: Ostomy pink and moist. Bowel sounds active X4.    Lab Results Lab Results  Component Value Date   WBC 21.3* 05/11/2015   HGB 6.8* 05/11/2015   HCT 19.0* 05/11/2015   MCV 84.1 05/11/2015   PLT 252 05/11/2015    Lab Results  Component Value Date   CREATININE  0.47* 05/11/2015   BUN 22* 05/11/2015   NA 136 05/11/2015   K 3.7 05/11/2015   CL 111 05/11/2015   CO2 18* 05/11/2015    Lab Results  Component Value Date   ALT 13* 05/11/2015   AST 23 05/11/2015   ALKPHOS 95 05/11/2015   BILITOT 0.3 05/11/2015     Microbiology: Recent Results (from the past 240 hour(s))  Culture, blood (routine x 2)     Status: None   Collection Time: 05/04/15  6:30 PM  Result Value Ref Range Status   Specimen Description BLOOD PICC LINE  Final   Special Requests BOTTLES DRAWN AEROBIC AND ANAEROBIC 5ML  Final   Culture   Final    NO GROWTH 5 DAYS Performed at [facility]    Report Status 05/09/2015 FINAL  Final  Culture, blood (routine x 2)     Status: None   Collection Time: 05/04/15  6:30 PM  Result Value Ref Range Status   Specimen Description BLOOD PICC LINE  Final   Special Requests BOTTLES DRAWN AEROBIC AND ANAEROBIC 5ML  Final   Culture   Final    NO GROWTH 5 DAYS Performed at [facility]    Report Status 05/09/2015 FINAL  Final  Anaerobic culture     Status: None   Collection Time: 05/05/15  1:01 PM  Result Value Ref Range Status   Specimen Description ABSCESS  Final   Special Requests SPEC A ON SWAB RIGHT PELVIC ABSCESS  Final   Gram Stain   Final    FEW WBC PRESENT, PREDOMINANTLY MONONUCLEAR NO SQUAMOUS EPITHELIAL CELLS SEEN FEW GRAM POSITIVE COCCI IN PAIRS IN CLUSTERS Performed at [facility]    Culture   Final    NO ANAEROBES ISOLATED Performed at [facility]    Report Status 05/10/2015 FINAL  Final  Culture, routine-abscess     Status: None   Collection Time: 05/05/15  1:01 PM  Result Value Ref Range Status   Specimen Description ABSCESS  Final   Special Requests SPEC A ON SWAB RIGHT PELVIC ABSCESS  Final   Gram Stain   Final    FEW WBC PRESENT, PREDOMINANTLY MONONUCLEAR NO SQUAMOUS EPITHELIAL CELLS SEEN FEW GRAM POSITIVE COCCI IN PAIRS IN CLUSTERS Performed at [facility]     Culture   Final    MODERATE METHICILLIN RESISTANT STAPHYLOCOCCUS AUREUS Note: RIFAMPIN AND GENTAMICIN SHOULD NOT BE USED AS SINGLE DRUGS FOR TREATMENT OF STAPH INFECTIONS. CRITICAL RESULT CALLED TO, READ BACK BY AND VERIFIED WITH: IREKIA B. 8/1 @216   BY REAMM Performed at [facility]    Report Status 05/09/2015 FINAL  Final   Organism ID, Bacteria METHICILLIN RESISTANT STAPHYLOCOCCUS AUREUS  Final      Susceptibility   Methicillin resistant staphylococcus aureus - MIC*    CLINDAMYCIN >=8 RESISTANT Resistant     ERYTHROMYCIN >=8 RESISTANT Resistant     GENTAMICIN <=0.5 SENSITIVE Sensitive     LEVOFLOXACIN >=8 RESISTANT Resistant     OXACILLIN >=4 RESISTANT Resistant     RIFAMPIN <=0.5 SENSITIVE Sensitive     TRIMETH/SULFA >=320 RESISTANT Resistant     VANCOMYCIN 1 SENSITIVE Sensitive     TETRACYCLINE <=1 SENSITIVE Sensitive     * MODERATE METHICILLIN RESISTANT STAPHYLOCOCCUS AUREUS  Tissue culture     Status: None   Collection Time: 05/05/15  1:13 PM  Result Value Ref Range Status   Specimen Description TISSUE  Final   Special Requests SPEC B IN CUP BONE RIGHT ISCHIUM  Final   Gram Stain   Final    RARE WBC PRESENT, PREDOMINANTLY MONONUCLEAR NO ORGANISMS SEEN Performed at [facility]    Culture   Final    FEW METHICILLIN RESISTANT STAPHYLOCOCCUS AUREUS Note: RIFAMPIN AND GENTAMICIN SHOULD NOT BE USED AS SINGLE DRUGS FOR TREATMENT OF STAPH INFECTIONS. CRITICAL RESULT CALLED TO, READ BACK BY AND VERIFIED WITH: IREKIA B. 8/1 @216  BY REAMM Performed at [facility]    Report Status 05/09/2015 FINAL  Final   Organism ID, Bacteria METHICILLIN RESISTANT STAPHYLOCOCCUS AUREUS  Final      Susceptibility   Methicillin resistant staphylococcus aureus - MIC*    CLINDAMYCIN >=8 RESISTANT Resistant     ERYTHROMYCIN >=8 RESISTANT Resistant     GENTAMICIN <=0.5 SENSITIVE Sensitive     LEVOFLOXACIN >=8 RESISTANT Resistant     OXACILLIN >=4 RESISTANT Resistant      RIFAMPIN <=0.5 SENSITIVE Sensitive     TRIMETH/SULFA >=320 RESISTANT Resistant     VANCOMYCIN 1 SENSITIVE Sensitive     TETRACYCLINE <=1 SENSITIVE Sensitive     * FEW METHICILLIN RESISTANT STAPHYLOCOCCUS AUREUS  Culture, respiratory (NON-Expectorated)     Status: None (Preliminary result)   Collection Time: 05/08/15  8:36 AM  Result Value Ref Range Status   Specimen Description TRACHEAL ASPIRATE  Final   Special Requests Normal  Final   Gram Stain   Final    RARE WBC PRESENT, PREDOMINANTLY PMN NO SQUAMOUS EPITHELIAL CELLS SEEN FEW GRAM NEGATIVE RODS RARE GRAM POSITIVE COCCI IN PAIRS RARE YEAST    Culture   Final    ABUNDANT ACINETOBACTER CALCOACETICUS/BAUMANNII COMPLEX Note: TIGECYCLINE PENDING COLISTIN PENDING Performed at [facility]    Report Status PENDING  Incomplete   Organism ID, Bacteria ACINETOBACTER CALCOACETICUS/BAUMANNII COMPLEX  Final      Susceptibility   Acinetobacter calcoaceticus/baumannii complex - MIC*    AMPICILLIN >=32 RESISTANT Resistant     AMPICILLIN/SULBACTAM 4 SENSITIVE Sensitive     CEFAZOLIN >=64 RESISTANT Resistant     CEFTAZIDIME >=64 RESISTANT Resistant     CEFTRIAXONE >=64 RESISTANT Resistant     CIPROFLOXACIN >=4 RESISTANT Resistant     GENTAMICIN <=1 SENSITIVE Sensitive     IMIPENEM 0.5 SENSITIVE Sensitive     PIP/TAZO >=128 RESISTANT Resistant     TOBRAMYCIN <=1 SENSITIVE Sensitive     TRIMETH/SULFA >=320 RESISTANT Resistant     * ABUNDANT ACINETOBACTER CALCOACETICUS/BAUMANNII COMPLEX  Culture, blood (routine x 2)     Status: None   Collection Time: 05/10/15  3:05 PM  Result Value Ref Range Status   Specimen Description Blood  Final   Special Requests Normal  Final   Culture  Setup Time TEST WILL BE CREDITED TEST CANCELLED PER RN   Final   Culture TEST WILL BE CREDITED TEST CANCELLED PER RN   Final   Report Status 05/10/2015 FINAL  Final    Assessment:  1. HCAP: Respiratory cx's positive for Acinetobacter with  sensitivity to Unasyn, Gentamycin, Imipenem, Tobramycin.  He has multiple known allergies to antibiotics including PCN, Sulfa and Ertapenem allergies. Per Dr. Drucilla Schmidt and Pharmacy rec's, Will start: - Tigecycline with 100 mg LD, followed by 50mg  q12h - Gentamicin 520 mg (~7mg /kg) q24h - F/u 10-hr gent level at 2100 - Monitor SCr, UOP, CBC  2. Micro-aerophilic streptococcal and MRSA bacteremia: Blood cultures last drawn 7/28. Will need removal of ALL lines and blood cultures repeated after line removal. Needs central line holiday once he is hemodynamically stable.   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 Daily dressing changes per Plastic rec's.   Tilden Fossa, NP student [facility] 05/11/2015, 10:47 AM   INFECTIOUS DISEASE ATTENDING ADDENDUM:     [facility] Infectious Disease   Date: 05/11/2015  Patient name: Jason [name]  Medical record number: [id number]  Date of birth: 1985/10/12    This patient has been seen and discussed with the NP please see her  note for complete details. I concur with their findings with the following additions/corrections:  He is on less pressor support today but lungs still course  Patient having acinetobacter pneumonia is Yet another series of poor prognostic factors for him. Due to his multiple allergies where bit handicapped in terms of which drugs we can choose. Have asked pharmacy to have the lab to check a tigecycline MIC through a E test as well as test for susceptibility for colistin. We will start the patient on tigecycline and gentamicin for now  With regards to the patient's microaerophilic streptococcal and MRSA bacteremia will continuethe patient on vancomycin. If we run into problems with nephrotoxicity related to vancomycin and gentamicin we can consider dropping the vancomycin since tigecycline would cover MRSA and microaerophilic strep itself although  it is not an ideal agent for treating bacteremias.  Rhina Brackett [place] 05/11/2015, 12:02 PM

## 2015-05-11 NOTE — Progress Notes (Signed)
[facility] Progress Note Patient Name: Jason [name] DOB: [birth year] MRN: [id number]   Date of Service  05/11/2015  HPI/Events of Note  Worsening dyspnea and hypoxemia Weak cough Requiring non-rebreather   eICU Interventions  ABG stat CXR stat Bedside provider to evaluate, may need intubation     Intervention Category Major Interventions: Respiratory failure - evaluation and management  Jason [name] 05/11/2015, 10:45 PM

## 2015-05-11 NOTE — Progress Notes (Signed)
Inpatient Diabetes Program Recommendations  AACE/ADA: New Consensus Statement on Inpatient Glycemic Control (2013)  Target Ranges:  Prepandial:   less than 140 mg/dL      Peak postprandial:   less than 180 mg/dL (1-2 hours)      Critically ill patients:  140 - 180 mg/dL  Results for NOMAR, BROAD (MRN [id number]) as of 05/11/2015 13:56  Ref. Range 05/10/2015 19:51 05/11/2015 03:50 05/11/2015 08:43 05/11/2015 11:49  Glucose-Capillary Latest Ref Range: 65-99 mg/dL 148 (H) 140 (H) 182 (H) 254 (H)   Diabetes history: Type 1 diabetes Outpatient Diabetes medications: Lantus 30 units, Novolog 5 units tid with meals Current orders for Inpatient glycemic control:  Novolog resistant tid with meals  Please increase frequency of Novolog moderate correction to q 4 hours (since patient is on tube feeds).  Also consider adding Lantus 6 units daily.  Thanks, Adah Perl, RN, BC-ADM Inpatient Diabetes Coordinator Pager [phone] (8a-5p)

## 2015-05-12 ENCOUNTER — Inpatient Hospital Stay (HOSPITAL_COMMUNITY): Payer: Medicaid Other

## 2015-05-12 DIAGNOSIS — Z7189 Other specified counseling: Secondary | ICD-10-CM | POA: Insufficient documentation

## 2015-05-12 DIAGNOSIS — J156 Pneumonia due to other aerobic Gram-negative bacteria: Secondary | ICD-10-CM

## 2015-05-12 DIAGNOSIS — J9601 Acute respiratory failure with hypoxia: Secondary | ICD-10-CM | POA: Insufficient documentation

## 2015-05-12 DIAGNOSIS — B957 Other staphylococcus as the cause of diseases classified elsewhere: Secondary | ICD-10-CM

## 2015-05-12 LAB — POCT I-STAT 3, ART BLOOD GAS (G3+)
Acid-base deficit: 4 mmol/L — ABNORMAL HIGH (ref 0.0–2.0)
Bicarbonate: 20 mEq/L (ref 20.0–24.0)
O2 SAT: 100 %
PCO2 ART: 30 mmHg — AB (ref 35.0–45.0)
PH ART: 7.431 (ref 7.350–7.450)
PO2 ART: 347 mmHg — AB (ref 80.0–100.0)
TCO2: 21 mmol/L (ref 0–100)

## 2015-05-12 LAB — BASIC METABOLIC PANEL
ANION GAP: 6 (ref 5–15)
ANION GAP: 7 (ref 5–15)
BUN: 17 mg/dL (ref 6–20)
BUN: 13 mg/dL (ref 6–20)
CALCIUM: 7.4 mg/dL — AB (ref 8.9–10.3)
CALCIUM: 7.4 mg/dL — AB (ref 8.9–10.3)
CHLORIDE: 111 mmol/L (ref 101–111)
CO2: 21 mmol/L — ABNORMAL LOW (ref 22–32)
CO2: 20 mmol/L — ABNORMAL LOW (ref 22–32)
CREATININE: 0.35 mg/dL — AB (ref 0.61–1.24)
Chloride: 108 mmol/L (ref 101–111)
Creatinine, Ser: 0.43 mg/dL — ABNORMAL LOW (ref 0.61–1.24)
GFR calc Af Amer: >60 mL/min (ref >60)
GFR calc Af Amer: >60 mL/min (ref >60)
GFR calc non Af Amer: >60 mL/min (ref >60)
Glucose, Bld: 92 mg/dL (ref 65–99)
Glucose, Bld: 164 mg/dL — ABNORMAL HIGH (ref 65–99)
POTASSIUM: 2.9 mmol/L — AB (ref 3.5–5.1)
Potassium: 3.9 mmol/L (ref 3.5–5.1)
SODIUM: 138 mmol/L (ref 135–145)
SODIUM: 135 mmol/L (ref 135–145)

## 2015-05-12 LAB — CBC WITH DIFFERENTIAL/PLATELET
BASOS PCT: 0 % (ref 0–1)
Basophils Absolute: 0 10*3/uL (ref 0.0–0.1)
Eosinophils Absolute: 0 10*3/uL (ref 0.0–0.7)
Eosinophils Relative: 0 % (ref 0–5)
HCT: 31.5 % — ABNORMAL LOW (ref 39.0–52.0)
HEMOGLOBIN: 11 g/dL — AB (ref 13.0–17.0)
Lymphocytes Relative: 4 % — ABNORMAL LOW (ref 12–46)
Lymphs Abs: 1.1 10*3/uL (ref 0.7–4.0)
MCH: 29.1 pg (ref 26.0–34.0)
MCHC: 34.9 g/dL (ref 30.0–36.0)
MCV: 83.3 fL (ref 78.0–100.0)
MONO ABS: 0.6 10*3/uL (ref 0.1–1.0)
Monocytes Relative: 2 % — ABNORMAL LOW (ref 3–12)
NEUTROS ABS: 26.1 10*3/uL — AB (ref 1.7–7.7)
Neutrophils Relative %: 94 % — ABNORMAL HIGH (ref 43–77)
Platelets: 169 10*3/uL (ref 150–400)
RBC: 3.78 MIL/uL — AB (ref 4.22–5.81)
RDW: 16.6 % — AB (ref 11.5–15.5)
WBC: 27.8 10*3/uL — ABNORMAL HIGH (ref 4.0–10.5)

## 2015-05-12 LAB — TYPE AND SCREEN
ABO/RH(D): O POS
ANTIBODY SCREEN: NEGATIVE
Unit division: 0

## 2015-05-12 LAB — CULTURE, RESPIRATORY W GRAM STAIN: Special Requests: NORMAL

## 2015-05-12 LAB — GLUCOSE, CAPILLARY
GLUCOSE-CAPILLARY: 117 mg/dL — AB (ref 65–99)
GLUCOSE-CAPILLARY: 138 mg/dL — AB (ref 65–99)
GLUCOSE-CAPILLARY: 145 mg/dL — AB (ref 65–99)
Glucose-Capillary: 141 mg/dL — ABNORMAL HIGH (ref 65–99)
Glucose-Capillary: 95 mg/dL (ref 65–99)
Glucose-Capillary: 133 mg/dL — ABNORMAL HIGH (ref 65–99)

## 2015-05-12 LAB — GENTAMICIN LEVEL, RANDOM: Gentamicin Rm: 8.2 ug/mL

## 2015-05-12 LAB — MAGNESIUM: MAGNESIUM: 1.9 mg/dL (ref 1.7–2.4)

## 2015-05-12 MED ORDER — ETOMIDATE 2 MG/ML IV SOLN
40.0000 mg | Freq: Once | INTRAVENOUS | Status: DC
  Filled 2015-05-12: qty 20

## 2015-05-12 MED ORDER — FENTANYL CITRATE (PF) 100 MCG/2ML IJ SOLN
200.0000 ug | Freq: Once | INTRAMUSCULAR | Status: AC
  Administered 2015-05-12: 200 ug via INTRAVENOUS

## 2015-05-12 MED ORDER — ETOMIDATE 2 MG/ML IV SOLN
20.0000 mg | Freq: Once | INTRAVENOUS | Status: AC
  Administered 2015-05-12: 20 mg via INTRAVENOUS

## 2015-05-12 MED ORDER — POTASSIUM CHLORIDE 10 MEQ/50ML IV SOLN
10.0000 meq | INTRAVENOUS | Status: AC
  Administered 2015-05-12 (×8): 10 meq via INTRAVENOUS
  Filled 2015-05-12 (×8): qty 50

## 2015-05-12 MED ORDER — MAGNESIUM SULFATE 2 GM/50ML IV SOLN
2.0000 g | Freq: Once | INTRAVENOUS | Status: AC
  Administered 2015-05-12: 2 g via INTRAVENOUS
  Filled 2015-05-12: qty 50

## 2015-05-12 MED ORDER — MIDAZOLAM HCL 2 MG/2ML IJ SOLN
4.0000 mg | Freq: Once | INTRAMUSCULAR | Status: AC
  Administered 2015-05-12: 2 mg via INTRAVENOUS

## 2015-05-12 MED ORDER — MIDAZOLAM HCL 2 MG/2ML IJ SOLN
INTRAMUSCULAR | Status: AC
  Filled 2015-05-12: qty 2

## 2015-05-12 MED ORDER — FENTANYL CITRATE (PF) 100 MCG/2ML IJ SOLN: 100.0000 ug | Freq: Once | INTRAMUSCULAR | Status: AC

## 2015-05-12 MED ORDER — VECURONIUM BROMIDE 10 MG IV SOLR: 10.0000 mg | Freq: Once | INTRAVENOUS | Status: DC

## 2015-05-12 MED ORDER — PROPOFOL 500 MG/50ML IV EMUL
5.0000 ug/kg/min | Freq: Once | INTRAVENOUS | Status: AC
Start: 2015-05-12 — End: 2015-05-12
  Administered 2015-05-12: 30 ug/kg/min via INTRAVENOUS
  Filled 2015-05-12: qty 50

## 2015-05-12 MED ORDER — GENTAMICIN SULFATE 40 MG/ML IJ SOLN
7.0000 mg/kg | INTRAVENOUS | Status: DC
  Administered 2015-05-13 – 2015-05-17 (×3): 520 mg via INTRAVENOUS
  Filled 2015-05-12 (×3): qty 13

## 2015-05-12 MED ORDER — MIDAZOLAM HCL 2 MG/2ML IJ SOLN: 2.0000 mg | Freq: Once | INTRAMUSCULAR | Status: AC

## 2015-05-12 NOTE — Progress Notes (Signed)
Jason [name] is a 29 y.o. male who has been consulted for aminoglycoside dosing.  The patient has a current serum creatinine of  CREATININE, SER (mg/dL)  Date Value  05/11/2015 0.47*   which calculates to an estimated CrCl of Estimated Creatinine Clearance: 130.6 mL/min (by C-G formula based on Cr of 0.47).  10-hr gent level is 8.2 after 7mg /kg dose of gentamicin.  Will change extended interval dosing to Q48H and continue to monitor.    Wynona Neat, PharmD, BCPS 05/12/2015 12:43 AM

## 2015-05-12 NOTE — Progress Notes (Signed)
PULMONARY / CRITICAL CARE MEDICINE   Name: Jason [name] MRN: [id number] DOB: 1985/10/23    ADMISSION DATE: 04/25/2015 TODAY'S DATE: 05/10/2015  REFERRING MD : TRH  CHIEF COMPLAINT: Hypotension  INITIAL PRESENTATION: 29 year old male quad with MRSA bacteremia from multiple sources. Concern for endocarditis. Wounds have been checked and followed by general surgery and ortho. Patient has history of hypotension and concern for septic shock and levophed was started and PCCM consulted. 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 Now with possible HCAP.   SIGNIFICANT EVENTS: 7/28 >> OR for debridement of osteomyelitis and abscess cx with staph. Positive MRSA and strep blood cx 7/28 >> Reconsulted for extensive post op bleeding and hypotension 7/31 >> worsening respiratory status, acidosis, hypoxemia, hypotension overnight  STUDIES: 7/28 TEE >> NO vegetation, normal LV function  7/31 Bronch >> HCAP, extensive mucous plug 8/2 bronch - no sig obstruction 8/2- family does not want trach at this point even though is recommended 8/3 bronch lungs open, clear, extubated, re intubated lat evening due to collapse  SUBJECTIVE: Sedated on vent  VITAL SIGNS: Temp:  [97.1 F (36.2 C)-97.9 F (36.6 C)] 97.5 F (36.4 C) (08/03 1943) Pulse Rate:  [67-118] 67 (08/04 0720) Resp:  [13-26] 17 (08/04 0720) BP: (79-119)/(49-93) 85/61 mmHg (08/04 0700) SpO2:  [78 %-100 %] 100 % (08/04 0720) FiO2 (%):  [15 %-100 %] 40 % (08/04 0720) Weight:  [71.124 kg (156 lb 12.8 oz)] 71.124 kg (156 lb 12.8 oz) (08/04 0500) HEMODYNAMICS:   VENTILATOR SETTINGS: Vent Mode:  [-] PRVC FiO2 (%):  [15 %-100 %] 40 % Set Rate:  [16 bmp-20 bmp] 16 bmp Vt Set:  [550 mL] 550 mL PEEP:  [5 cmH20] 5 cmH20 Pressure Support:  [5 cmH20-10 cmH20] 5 cmH20 Plateau Pressure:  [18 cmH20-28 cmH20] 28 cmH20 INTAKE / OUTPUT:  Intake/Output Summary (Last 24 hours) at 05/12/15 0810 Last  data filed at 05/12/15 0700  Gross per 24 hour  Intake 2189.63 ml  Output   2400 ml  Net -210.37 ml    PHYSICAL EXAMINATION:  Gen: chronically ill appearing, rass -2 HENT: ett back PULM: decreased BS BL, CTA left CV: RRR, no mgr, edema arms, feet GI: BS+, soft, nontender Derm: chronic breakdown, sacral wound stage 4 plus Neuro:  rass -2, lethargic, quad ,follows commands   LABS:  CBC  Recent Labs Lab 05/10/15 0536 05/11/15 0511 05/12/15 0607  WBC 46.9* 21.3* 27.8*  HGB 7.6* 6.8* 11.0*  HCT 21.4* 19.0* 31.5*  PLT 281 252 169   Coag's  Recent Labs Lab 05/06/15 1600  INR 1.80*   BMET  Recent Labs Lab 05/10/15 0536 05/11/15 0511 05/12/15 0607  NA 133* 136 138  K 3.4* 3.7 2.9*  CL 107 111 111  CO2 17* 18* 21*  BUN 23* 22* 17  CREATININE 0.64 0.47* 0.35*  GLUCOSE 133* 178* 92   Electrolytes  Recent Labs Lab 05/10/15 0536  05/10/15 2153 05/11/15 0511 05/11/15 0821 05/11/15 2100 05/12/15 0607  CALCIUM 7.5*  --   --  7.7*  --   --  7.4*  MG 1.8  --  1.7 1.6* 1.6* 1.4* 1.9  PHOS  --   < > 2.8  --  2.7 2.7  --   < > = values in this interval not displayed. Sepsis Markers  Recent Labs Lab 05/08/15 0847 05/08/15 1040 05/10/15 1443  LATICACIDVEN 1.4 2.0 2.4*   ABG  Recent Labs Lab 05/08/15  0856 05/11/15 2255 05/12/15 0135  PHART 7.385 7.347* 7.431  PCO2ART 31.5* 32.4* 30.0*  PO2ART 156.0* 137.0* 347.0*   Liver Enzymes  Recent Labs Lab 05/10/15 0536 05/11/15 0511  AST 29 23  ALT 12* 13*  ALKPHOS 105 95  BILITOT 0.3 0.3  ALBUMIN <1.0* <1.0*   Cardiac Enzymes No results for input(s): TROPONINI, PROBNP in the last 168 hours. Glucose  Recent Labs Lab 05/11/15 1231 05/11/15 1407 05/11/15 1538 05/11/15 1939 05/11/15 2321 05/12/15 0317  GLUCAP 256* 241* 220* 182* 141* 95    Imaging 8/3 CXR >RLL collapse    ASSESSMENT / PLAN:  PULMONARY A:  Acute hypoxemia respiratory failure > due to pulm edema vs HCAP with  atelectasis > currently not compensating for metabolic acidosis; consider TRALI if no improvement with positive pressure Pleural effusion R base, small Collapse rt base RML, RLL< improved post bronch- reintubated P:   Failed extubation trial - needs trach vs comfort Will call mother now Consider peep to 8 iof reatx noted Can wean for exercise cpap 8 ps 10 Treating MDR, see ID  CARDIOVASCULAR Right Arm PICC 7/19>>> A:  Shock resolved Relative adrenal insufficiency P:  Reduce steroids further  Continue Midodrine 20mg  PO TID Consider dnr  RENAL A:  Hyponatremia >mild Non anion gap metabolic acidosis - likely due to NaCl administration small Anion gap acidosis present of 5 difference Hypo K  P:  Bicarb drip dc Replete k Chem, in am  Repeat bmet in pm   GASTROINTESTINAL OGT A:  Severe malnutrition P:  Tf hold until trach plans set Zofran PRN nausea PPI 40 qd Will need peg  HEMATOLOGIC A:  Anemia - due to blood loss from sacral wound + peri op bleeding from debridement, dilution DVT prevention Persistent leukocytosis MAy 2016 PE P:  Trend CBC in am  SCD Will place IVC filter if aggressive measures continued  INFECTIOUS A:  MRSA bacteremia, neg TEE, likely osteomyelitis source E coli UTI  New HCAP likely Once finish abx course, need to remove all lines  MDR acinatobacter P:  BCx2 7/18 >>  MRSA BCx2 7/21 >>MRSA, strep Wound/tissue culture 7/28 >> Abscess culture 7/28 >> Bronch bal 7/31 >> MDR acint   Vancomycin 7/18>>> Aztreonam 7/18>>>8/3 8/1 cipro>>>8/3 8/3 gent >>> 8/3 tygacyclin>>>  Poor prognosis, maintain above crt in am   ENDOCRINE A:  Possible adrenal insufficiency  T1 DM hypoglycemia P:  SSI reduce hydrocortisone in setting woumd  NEUROLOGIC A: Quad post trauma Post op pain  Acute encephalopathy 7/31 > multifactorial, due to sepsis, anemia, hypotension, pain medications P:  RASS goal 0 PRN  fentanyl WUA Ccm time 35 min   Summary - needs trach or comfort, will call mom  Lavon Paganini. Titus Mould, MD, [place] Pgr: [facility]

## 2015-05-12 NOTE — Progress Notes (Signed)
[facility]ious [facility]    Date of Admission:  04/25/2015   Total days of antibiotics 18        Day 18 Vanc        Day 2 Tigecycline        Day 2 Gentamycin  Active Problems:   Type 1 diabetes, uncontrolled, with neuropathy   Spinal cord infarction (history of)   Sepsis   Stage IV decubitus ulcer   Urinary tract infectious disease   HCAP (healthcare-associated pneumonia)   Hypotension   Abdominopelvic abscess   Hemorrhagic shock   Acute blood loss anemia   Edema   MRSA bacteremia   Collapse, lung   Pulmonary emboli   Infection due to acinetobacter baumannii   VAP (ventilator-associated pneumonia)   Acute respiratory failure with hypoxemia   . sodium chloride   Intravenous Once  . albuterol  2.5 mg Nebulization Q4H  . antiseptic oral rinse  7 mL Mouth Rinse QID  . baclofen  10 mg Oral BID WC  . baclofen  20 mg Oral QHS  . chlorhexidine  15 mL Mouth Rinse BID  . [START ON 05/13/2015] gentamicin  7 mg/kg Intravenous Q48H  . hydrocortisone sodium succinate  25 mg Intravenous Q12H  . insulin aspart  0-15 Units Subcutaneous 6 times per day  . loratadine  10 mg Oral QHS  . midodrine  20 mg Oral TID WC  . mirtazapine  7.5 mg Oral QHS  . pantoprazole sodium  40 mg Per Tube Daily  . potassium chloride  10 mEq Intravenous Q1H  . pregabalin  100 mg Oral TID AC  . pregabalin  200 mg Oral QHS  . sodium chloride  10-40 mL Intracatheter Q12H  . tigecycline (TYGACIL) IVPB  50 mg Intravenous Q12H  . vancomycin  750 mg Intravenous Q48H    Subjective: Jason [name] will open eyes to voice and nod yes or no to questions. He nods his head yes to pain. Family was in regarding decision to trach vs comfort care. When family asked Jason [name] if he would like trach, he nodded his head yes. Critical Care team meeting with family scheduled for today to discuss goals of care.   Review of Systems: Unable: intubated, sedated  Past Medical History  Diagnosis Date  .  GERD (gastroesophageal reflux disease)   . Asthma   . Hx MRSA infection     on face  . Gastroparesis   . Diabetic neuropathy   . Seizures   . Family history of anesthesia complication     Pt mother can't have epidural procedures  . Dysrhythmia   . Pneumonia   . Arthritis   . Fibromyalgia   . 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 Type I diabetes mellitus     sees Dr. Loanne Drilling   . Syncope 02/16/2015    History  Substance Use Topics  . Smoking status: Former Smoker -- 1.00 packs/day for 12 years    Types: Cigars, Cigarettes    Quit date: 09/07/2014  . Smokeless tobacco: Never Used  . Alcohol Use: No    Family History  Problem Relation Age of Onset  . Diabetes Father   . Hypertension Father   . Asthma      fhx  . Hypertension      fhx  . Stroke      fhx  . Heart disease Mother    Allergies  Allergen Reactions  . Cefuroxime Axetil Anaphylaxis  . Ertapenem Other (See Comments)    Rash and confusion  . Morphine And Related Other (See Comments)    Changed mental status, confusion, headache, visual hallucination  . Penicillins Anaphylaxis and Other (See Comments)    ?can take amoxicillin?  . Sulfa Antibiotics Anaphylaxis, Shortness Of Breath and Other (See Comments)  . Tessalon [Benzonatate] Anaphylaxis  . Shellfish Allergy Itching and Other (See Comments)    Took benadryl to alleviate reaction  . Miripirium Rash and Other (See Comments)    Change in mental status    OBJECTIVE: Blood pressure 93/62, pulse 75, temperature 93.1 F (33.9 C), temperature source Rectal, resp. rate 16, height 5\' 8"  (1.727 m), weight 156 lb 12.8 oz (71.124 kg), SpO2 99 %. General: Sedated, opens eyes to voice <10seconds Skin: Dressing to sacral wound C/D/I Lungs: Course throughout CV: RRR, +4 edema throughout Abdomen: Ostomy pink and moist. Bowel sounds active X4.    Lab Results Lab Results  Component Value Date   WBC 27.8* 05/12/2015   HGB 11.0* 05/12/2015     HCT 31.5* 05/12/2015   MCV 83.3 05/12/2015   PLT 169 05/12/2015    Lab Results  Component Value Date   CREATININE 0.35* 05/12/2015   BUN 17 05/12/2015   NA 138 05/12/2015   K 2.9* 05/12/2015   CL 111 05/12/2015   CO2 21* 05/12/2015    Lab Results  Component Value Date   ALT 13* 05/11/2015   AST 23 05/11/2015   ALKPHOS 95 05/11/2015   BILITOT 0.3 05/11/2015     Microbiology: Recent Results (from the past 240 hour(s))  Culture, blood (routine x 2)     Status: None   Collection Time: 05/04/15  6:30 PM  Result Value Ref Range Status   Specimen Description BLOOD PICC LINE  Final   Special Requests BOTTLES DRAWN AEROBIC AND ANAEROBIC 5ML  Final   Culture   Final    NO GROWTH 5 DAYS Performed at [facility]    Report Status 05/09/2015 FINAL  Final  Culture, blood (routine x 2)     Status: None   Collection Time: 05/04/15  6:30 PM  Result Value Ref Range Status   Specimen Description BLOOD PICC LINE  Final   Special Requests BOTTLES DRAWN AEROBIC AND ANAEROBIC 5ML  Final   Culture   Final    NO GROWTH 5 DAYS Performed at [facility]    Report Status 05/09/2015 FINAL  Final  Anaerobic culture     Status: None   Collection Time: 05/05/15  1:01 PM  Result Value Ref Range Status   Specimen Description ABSCESS  Final   Special Requests SPEC A ON SWAB RIGHT PELVIC ABSCESS  Final   Gram Stain   Final    FEW WBC PRESENT, PREDOMINANTLY MONONUCLEAR NO SQUAMOUS EPITHELIAL CELLS SEEN FEW GRAM POSITIVE COCCI IN PAIRS IN CLUSTERS Performed at [facility]    Culture   Final    NO ANAEROBES ISOLATED Performed at [facility]    Report Status 05/10/2015 FINAL  Final  Culture, routine-abscess     Status: None   Collection Time: 05/05/15  1:01 PM  Result Value Ref Range Status   Specimen Description ABSCESS  Final   Special Requests SPEC A ON SWAB RIGHT PELVIC ABSCESS  Final   Gram Stain   Final    FEW WBC PRESENT, PREDOMINANTLY  MONONUCLEAR NO SQUAMOUS EPITHELIAL CELLS SEEN FEW GRAM  POSITIVE COCCI IN PAIRS IN CLUSTERS Performed at [facility]    Culture   Final    MODERATE METHICILLIN RESISTANT STAPHYLOCOCCUS AUREUS Note: RIFAMPIN AND GENTAMICIN SHOULD NOT BE USED AS SINGLE DRUGS FOR TREATMENT OF STAPH INFECTIONS. CRITICAL RESULT CALLED TO, READ BACK BY AND VERIFIED WITH: IREKIA B. 8/1 @216  BY REAMM Performed at [facility]    Report Status 05/09/2015 FINAL  Final   Organism ID, Bacteria METHICILLIN RESISTANT STAPHYLOCOCCUS AUREUS  Final      Susceptibility   Methicillin resistant staphylococcus aureus - MIC*    CLINDAMYCIN >=8 RESISTANT Resistant     ERYTHROMYCIN >=8 RESISTANT Resistant     GENTAMICIN <=0.5 SENSITIVE Sensitive     LEVOFLOXACIN >=8 RESISTANT Resistant     OXACILLIN >=4 RESISTANT Resistant     RIFAMPIN <=0.5 SENSITIVE Sensitive     TRIMETH/SULFA >=320 RESISTANT Resistant     VANCOMYCIN 1 SENSITIVE Sensitive     TETRACYCLINE <=1 SENSITIVE Sensitive     * MODERATE METHICILLIN RESISTANT STAPHYLOCOCCUS AUREUS  Tissue culture     Status: None   Collection Time: 05/05/15  1:13 PM  Result Value Ref Range Status   Specimen Description TISSUE  Final   Special Requests SPEC B IN CUP BONE RIGHT ISCHIUM  Final   Gram Stain   Final    RARE WBC PRESENT, PREDOMINANTLY MONONUCLEAR NO ORGANISMS SEEN Performed at [facility]    Culture   Final    FEW METHICILLIN RESISTANT STAPHYLOCOCCUS AUREUS Note: RIFAMPIN AND GENTAMICIN SHOULD NOT BE USED AS SINGLE DRUGS FOR TREATMENT OF STAPH INFECTIONS. CRITICAL RESULT CALLED TO, READ BACK BY AND VERIFIED WITH: IREKIA B. 8/1 @216  BY REAMM Performed at [facility]    Report Status 05/09/2015 FINAL  Final   Organism ID, Bacteria METHICILLIN RESISTANT STAPHYLOCOCCUS AUREUS  Final      Susceptibility   Methicillin resistant staphylococcus aureus - MIC*    CLINDAMYCIN >=8 RESISTANT Resistant     ERYTHROMYCIN >=8 RESISTANT  Resistant     GENTAMICIN <=0.5 SENSITIVE Sensitive     LEVOFLOXACIN >=8 RESISTANT Resistant     OXACILLIN >=4 RESISTANT Resistant     RIFAMPIN <=0.5 SENSITIVE Sensitive     TRIMETH/SULFA >=320 RESISTANT Resistant     VANCOMYCIN 1 SENSITIVE Sensitive     TETRACYCLINE <=1 SENSITIVE Sensitive     * FEW METHICILLIN RESISTANT STAPHYLOCOCCUS AUREUS  Culture, respiratory (NON-Expectorated)     Status: None   Collection Time: 05/08/15  8:36 AM  Result Value Ref Range Status   Specimen Description TRACHEAL ASPIRATE  Final   Special Requests Normal  Final   Gram Stain   Final    RARE WBC PRESENT, PREDOMINANTLY PMN NO SQUAMOUS EPITHELIAL CELLS SEEN FEW GRAM NEGATIVE RODS RARE GRAM POSITIVE COCCI IN PAIRS RARE YEAST    Culture   Final    ABUNDANT ACINETOBACTER CALCOACETICUS/BAUMANNII COMPLEX Note: TIGECYCLINE  MIC 1 COLISTIN SENSITIVE .0125 ug/mL  ETEST results for this drug are "FOR INVESTIGATIONAL USE ONLY" and should NOT be used for clinical purposes. Performed at [facility]    Report Status 05/12/2015 FINAL  Final   Organism ID, Bacteria ACINETOBACTER CALCOACETICUS/BAUMANNII COMPLEX  Final      Susceptibility   Acinetobacter calcoaceticus/baumannii complex - MIC*    AMPICILLIN >=32 RESISTANT Resistant     AMPICILLIN/SULBACTAM 4 SENSITIVE Sensitive     CEFAZOLIN >=64 RESISTANT Resistant     CEFTAZIDIME >=64 RESISTANT Resistant     CEFTRIAXONE >=64 RESISTANT  Resistant     CIPROFLOXACIN >=4 RESISTANT Resistant     GENTAMICIN <=1 SENSITIVE Sensitive     IMIPENEM 0.5 SENSITIVE Sensitive     PIP/TAZO >=128 RESISTANT Resistant     TOBRAMYCIN <=1 SENSITIVE Sensitive     TRIMETH/SULFA >=320 RESISTANT Resistant     * ABUNDANT ACINETOBACTER CALCOACETICUS/BAUMANNII COMPLEX  Culture, blood (routine x 2)     Status: None   Collection Time: 05/10/15  3:05 PM  Result Value Ref Range Status   Specimen Description Blood  Final   Special Requests Normal  Final   Culture  Setup Time TEST  WILL BE CREDITED TEST CANCELLED PER RN   Final   Culture TEST WILL BE CREDITED TEST CANCELLED PER RN   Final   Report Status 05/10/2015 FINAL  Final    Assessment/Plan: 1. HCAP: Respiratory cx's positive for Acinetobacter with sensitivity to Unasyn, Gentamycin, Imipenem, Tobramycin. He has multiple known allergies to antibiotics including PCN, Sulfa and Ertapenem allergies. Continue: - Tigecycline with 100 mg LD, followed by 50mg  q12h - Gentamicin 520 mg (~7mg /kg) q24h - Monitor SCr, UOP, CBC  2. Micro-aerophilic streptococcal and MRSA bacteremia: Blood cultures last drawn 7/28. Will need removal of ALL lines and blood cultures repeated after line removal. Needs central line holiday once he is hemodynamically stable.   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 Daily dressing changes per Plastic rec's.   Unfortunately, Jason [name] has a poor prognosis. He has been unable to wean from the vent, multiple infections with MDRO's (questionable endocarditis) and multiple abx allergies. The critical care team will be discussing goals of care today with the family. He has been intubated for an extended period and a decision needs to be made to trach or go comfort care. Patient's family had several questions regarding his prognosis and what long term management would look like with his trach. I have deferred this to the critical care team to discuss at family meeting.   Tilden Fossa, NP student [facility] for Infectious Disease [facility] 05/12/2015, 11:35 AM  INFECTIOUS DISEASE ATTENDING ADDENDUM:     [facility]   Date: 05/12/2015  Patient name: Jason [name]  Medical record number: [id number]  Date of birth: 1986/10/06    This patient has been seen and discussed with the NP. Please see their note for complete details. I concur with their findings with the following additions/corrections:  Patient continues to do  poorly.  I wholeheartedly agree with Dr. Janit Pagan assessment and plan. Apparently the patient clearly vocalized to Dr. Titus Mould that he did not want to have further aggressive care including tracheostomy and that his preference was for comfort care approach. I'm fully supportive of this. This is a very tragic case but unfortunately this patient's prognosis is abysmal he poor and has been for some time.  I am willing to assist in discussions this afternoon as I know his mother well from prior admissions  For now we will continue the Tygacil and GENT for Acinetobacter and vancomycin for MRSA and microaerophilic streptococci bacteremia.  Should the patient go for aggressive care again he will ultimately need removal of his lines a line holiday to effect cure of his bacteremia.  Again to reiterate his prognosis long-term is very poor and he has by his mother's description not been wiling to be participate in his own care (re turning for ex) in a way that would give him a chance to heal from this condition.I think  we clearly should respect his wishes.    [name] 05/12/2015, 12:50 PM

## 2015-05-12 NOTE — Care Management Note (Addendum)
Case Management Note  Patient Details  Name: Jason [name] MRN: [id number] Date of Birth: 1986-05-24  Subjective/Objective:   Father in room sitting at bedside.  Mom at home, but I talked with both with the use of speaker phone.  Mom cares for son totally.  Patient has electric W/C at home and is total care.  If sitting up can hold own drink and drink from it and can eat most foods, but due to tremor liquid food or foods like spaghetti are difficult and Mom states she usually feeds.  Has had [facility] coming out to assist with dressing changes but Mom becomes very defensive and states they made it worse, whatever they do.  Does not like the nurse that has been coming out.  Is aware of CAPS program through Medicaid, but feels she does not need this.  Patient does not want someone else caring for him.   [facility] had called me earlier stating that due his condition continuing to deteriorate, they feel he is too high risk and will not be continuing [place] services.  Mom states she doesn't need them anyway can do it herself like she always does.  Also states he has been on the vent and sick like this before and he always pulls through and comes back home.  Feels he will be doing that again.                 Action/Plan:   Expected Discharge Date:   (unknown)               Expected Discharge Plan:  [place]  In-House Referral:  NA, Clinical Social Work  Discharge planning Services  CM Consult  Post Acute Care [place]:  NA Choice offered to:  NA  DME Arranged:  N/A DME Agency:  NA  HH Arranged:  NA HH Agency:  NA  Status of Service:  In process, will continue to follow  Medicare Important Message Given:    Date Medicare IM Given:    Medicare IM give by:    Date Additional Medicare IM Given:    Additional Medicare Important Message give by:     If discussed at [place] of Stay Meetings, dates discussed:   05-10-15, 05-12-15  Additional Comments:  05-12-15  Extubated on 8-3 and  reintubated.  Suggesting trach for continued care.  Mom and Dad and sister talked with PCCM physician, plan for trach today.  Mom still wants to take son home as that is what he wants.  Agreeable to [facility] agency to assist and work with Medicaid for assistance.  Referral made to [facility].  Will start process and will set up meeting with family on getting this whole process started and what it will entail.    Vergie Living, RN 05/12/2015, 10:24 AM

## 2015-05-12 NOTE — Procedures (Signed)
Bronchoscopy Procedure Note YONIEL ARKWRIGHT [id number] Jul 20, 1986  Procedure: Bronchoscopy Indications: Tracheostomy Placement   Procedure Details Consent: Risks of procedure as well as the alternatives and risks of each were explained to the (patient/caregiver).  Consent for procedure obtained. Time Out: Verified patient identification, verified procedure, site/side was marked, verified correct patient position, special equipment/implants available, medications/allergies/relevent history reviewed, required imaging and test results available.  Performed  In preparation for procedure, patient was given 100% FiO2 and bronchoscope lubricated. Sedation: Propofol & Fentanyl   Airway entered and the following bronchi were examined: upper airways and RUL  Procedures performed: suctioned extensive mucus from R side, thick white/creamy secretions Bronchoscope removed.  Airway examined post tracheostomy placement.  No bleeding.  Trach in proper position.   Evaluation Hemodynamic Status: BP stable throughout; O2 sats: stable throughout Patient's Current Condition: stable Specimens:  None Complications: No apparent complications Patient did tolerate procedure well.   Procedure performed under direct supervision of Dr. Titus Mould.     Noe Gens, NP-C  Pulmonary & Critical Care Pgr: [phone] or if no answer [phone] 05/12/2015, 3:06 PM  supervisd fully placement bronch through ETT We back up with direct visulaization to 17 cm. Trach placed and observed  Lavon Paganini. Titus Mould, MD, [place] Pgr: [facility]

## 2015-05-12 NOTE — Procedures (Signed)
Intubation Procedure Note PEYSON POSTEMA [id number] July 17, 1986  Procedure: Intubation Indications: Respiratory insufficiency  Procedure Details Consent: Risks of procedure as well as the alternatives and risks of each were explained to the (patient/caregiver).  Consent for procedure obtained. Time Out: Verified patient identification, verified procedure, site/side was marked, verified correct patient position, special equipment/implants available, medications/allergies/relevent history reviewed, required imaging and test results available.  Performed  Maximum sterile technique was used including gloves, hand hygiene and mask.  MAC 3 Glidescope  Grade 1 airway view  Meds: 2 mg midazolam 100 mcg sublimaze 20 mg etomidate  Size 8.0 ETT  26 cm at teeth Evaluation Hemodynamic Status: BP stable throughout; O2 sats: transiently fell during during procedure Patient's Current Condition: stable Complications: No apparent complications Patient did tolerate procedure well. Chest X-ray ordered to verify placement.  CXR: pending.  Georgann Housekeeper, AGACNP-BC [facility] Pulmonology/Critical Care Pager [phone] or [phone]  05/12/2015 12:09 AM

## 2015-05-12 NOTE — Procedures (Signed)
Name:  Jason [name] MRN:  [id number] DOB:  06-23-1986  OPERATIVE NOTE  Procedure:  Percutaneous tracheostomy.  Indications:  Ventilator-dependent respiratory failure.  Consent:  Procedure, alternatives, risks and benefits discussed with medical POA.  Questions answered.  Consent obtained.  Anesthesia:  Versed, fent, prop  Procedure summary:  Appropriate equipment was assembled.  The patient was identified as Jason [name] and safety timeout was performed. The patient was placed in supine position with a towel roll behind shoulder blades and neck extended.  Sterile technique was used. The patient's neck and upper chest were prepped using chlorhexidine / alcohol scrub and the field was draped in usual sterile fashion with full body drape. After the adequate sedation / anesthesia was achieved, attention was directed at the midline trachea, where the cricothyroid membrane was palpated. Approximately two fingerbreadths above the sternal notch, a verticle incision was created with a scalpel after local infiltration with 0.2% Lidocaine. Then, using Seldinger technique and a percutaneous tracheostomy set, the trachea was entered with a 14 gauge needle with an overlying sheath. This was all confirmed under direct visualization of a fiberoptic flexible bronchoscope. Entrance into the trachea was identified through the third tracheal ring interspace. Following this, a guidewire was inserted. The needle was removed, leaving the sheath and the guidewire intact. Next, the sheath was removed and a small dilator was inserted. The tracheal rings were then dilated. A #8 Shiley was then opened. The balloon was checked. It was placed over a tracheal dilator, which was then advanced over the guidewire and through the previously dilated tract. The Shiley tracheostomy tube was noted to pass in the trachea with little resistance. The guidewire and dilator tubes were removed from the trachea. An inner cannula was placed  through the tracheostomy tube. The tracheostomy was then secured at the anterior neck with 4 monofilament sutures. The oral endotracheal tube was removed and the ventilator was attached to the newly placed tracheostomy tube. Adequate tidal volumes were noted. The cuff was inflated and no evidence of air leak was noted. No evidence of bleeding was noted. At this point, the procedure was concluded. Post-procedure chest x-ray was ordered.  Complications:  No immediate complications were noted.  Hemodynamic parameters and oxygenation remained stable throughout the procedure.  Estimated blood loss:  Less then 1 mL.  Jason [name]., MD Pulmonary and [place] Pager: [phone]  05/12/2015, 3:02 PM   Can followu p trach clinic  [phone]

## 2015-05-12 NOTE — Progress Notes (Signed)
Wasted 50 ml of Fentanyl in med room sink with witness, Ines Bloomer, RN

## 2015-05-12 NOTE — Procedures (Signed)
Bronchoscopy Procedure Note KRISTOFOR MICHALOWSKI [id number] [birth year]  Procedure: Bronchoscopy Indications: Diagnostic evaluation of the airways and Remove secretions  Procedure Details Consent: Unable to obtain consent because of emergent medical necessity. Time Out: Verified patient identification, verified procedure, site/side was marked, verified correct patient position, special equipment/implants available, medications/allergies/relevent history reviewed, required imaging and test results available.  Performed  In preparation for procedure, patient was given 100% FiO2 and bronchoscope lubricated. Sedation: Benzodiazepines  Airway entered and the following bronchi were examined: Bronchi.   Procedures performed: BAL performed Bronchoscope removed.  , Patient placed back on 100% FiO2 at conclusion of procedure.    Evaluation Hemodynamic Status: BP stable throughout; O2 sats: stable throughout Patient's Current Condition: stable Specimens:  Sent purulent fluid Complications: No apparent complications Patient did tolerate procedure well.   Rigoberto Noel MD 05/12/2015

## 2015-05-12 NOTE — Progress Notes (Signed)
PULMONARY / CRITICAL CARE MEDICINE   Name: MEREDITH MELLS MRN: [id number] DOB: [birth year]    ADMISSION DATE: 04/25/2015 TODAY'S DATE: 05/10/2015  REFERRING MD : TRH  CHIEF COMPLAINT: Hypotension  INITIAL PRESENTATION: 29 year old male quad with MRSA bacteremia from multiple sources. Concern for endocarditis. Wounds have been checked and followed by general surgery and ortho. Patient has history of hypotension and concern for septic shock and levophed was started and PCCM consulted. 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 Now with possible HCAP.   SIGNIFICANT EVENTS: 7/28 >> OR for debridement of osteomyelitis and abscess cx with staph. Positive MRSA and strep blood cx 7/28 >> Reconsulted for extensive post op bleeding and hypotension 7/31 >> worsening respiratory status, acidosis, hypoxemia, hypotension overnight  STUDIES: 7/28 TEE >> NO vegetation, normal LV function  7/31 Bronch >> HCAP, extensive mucous plug 8/2 bronch - no sig obstruction 8/2- family does not want trach at this point even though is recommended  SUBJECTIVE: Extubated earlier today Called for resp distress, increased FIO2 now on NRB  VITAL SIGNS: Temp:  [97.1 F (36.2 C)-97.9 F (36.6 C)] 97.5 F (36.4 C) (08/03 1943) Pulse Rate:  [78-118] 83 (08/04 0000) Resp:  [13-26] 25 (08/04 0000) BP: (79-131)/(50-93) 103/71 mmHg (08/04 0000) SpO2:  [78 %-100 %] 100 % (08/04 0000) FiO2 (%):  [15 %-100 %] 15 % (08/03 2300) Weight:  [164 lb 3.2 oz (74.481 kg)] 164 lb 3.2 oz (74.481 kg) (08/03 0451) HEMODYNAMICS: CVP:  [7 mmHg] 7 mmHg VENTILATOR SETTINGS: Vent Mode:  [-] CPAP;PSV FiO2 (%):  [15 %-100 %] 15 % PEEP:  [5 cmH20] 5 cmH20 Pressure Support:  [5 cmH20-10 cmH20] 5 cmH20 INTAKE / OUTPUT:  Intake/Output Summary (Last 24 hours) at 05/12/15 0027 Last data filed at 05/11/15 2300  Gross per 24 hour  Intake 2507.05 ml  Output   2150 ml  Net 357.05 ml     PHYSICAL EXAMINATION:  Gen: chronically ill appearing, rass -1 HENT: on NRB PULM: decreased BS BL, some abd breathing CV: RRR, no mgr, edema arms, feet GI: BS+, soft, nontender Derm: chronic breakdown, sacral wound stage 4 Neuro:  rass -1, lethargic, quad ,follows commands   LABS:  CBC  Recent Labs Lab 05/09/15 0556 05/10/15 0536 05/11/15 0511  WBC 48.3* 46.9* 21.3*  HGB 8.0* 7.6* 6.8*  HCT 22.1* 21.4* 19.0*  PLT 284 281 252   Coag's  Recent Labs Lab 05/06/15 1600  INR 1.80*   BMET  Recent Labs Lab 05/09/15 0556 05/10/15 0536 05/11/15 0511  NA 131* 133* 136  K 4.0 3.4* 3.7  CL 110 107 111  CO2 18* 17* 18*  BUN 24* 23* 22*  CREATININE 0.77 0.64 0.47*  GLUCOSE 91 133* 178*   Electrolytes  Recent Labs Lab 05/09/15 0556  05/10/15 0536  05/10/15 2153 05/11/15 0511 05/11/15 0821 05/11/15 2100  CALCIUM 7.4*  --  7.5*  --   --  7.7*  --   --   MG 2.1  < > 1.8  --  1.7 1.6* 1.6* 1.4*  PHOS  --   < >  --   < > 2.8  --  2.7 2.7  < > = values in this interval not displayed. Sepsis Markers  Recent Labs Lab 05/08/15 0847 05/08/15 1040 05/10/15 1443  LATICACIDVEN 1.4 2.0 2.4*   ABG  Recent Labs Lab 05/08/15 0624 05/08/15 0856 05/11/15 2255  PHART 7.275* 7.385 7.347*  PCO2ART 39.5  31.5* 32.4*  PO2ART 61.0* 156.0* 137.0*   Liver Enzymes  Recent Labs Lab 05/10/15 0536 05/11/15 0511  AST 29 23  ALT 12* 13*  ALKPHOS 105 95  BILITOT 0.3 0.3  ALBUMIN <1.0* <1.0*   Cardiac Enzymes No results for input(s): TROPONINI, PROBNP in the last 168 hours. Glucose  Recent Labs Lab 05/11/15 0843 05/11/15 1149 05/11/15 1231 05/11/15 1407 05/11/15 1538 05/11/15 1939  GLUCAP 182* 254* 256* 241* 220* 182*    Imaging 8/3 CXR >RLL collapse    ASSESSMENT / PLAN:  PULMONARY A:  Acute hypoxemia respiratory failure > due to pulm edema vs HCAP with atelectasis > currently not compensating for metabolic acidosis; consider TRALI if no  improvement with positive pressure Pleural effusion R base, small Collapse rt base RML, RLL< improved post bronch P:   Failed extubation trial - will reintubate, place back on PEEP Vent settings adjusted Proceed with bronch to clear secretions Will likely need trach ETT adjusted on bronch to 3 cm above carina -25 cma  t lip  CARDIOVASCULAR Right Arm PICC 7/19>>> A:  Shock resolved Relative adrenal insufficiency P:  Reduce steroids in setting wound heeling needs and off pressors Continue Midodrine 20mg  PO TID Neo for MAP > 55, off  RENAL A:  Hyponatremia >mild Non anion gap metabolic acidosis - likely due to NaCl administration small Anion gap acidosis present of 5 difference Hypomag P:  Ct  bicarb for NONAG  Replete Mg  GASTROINTESTINAL OGT A:  Severe malnutrition P:  Tf hold  Zofran PRN nausea PPI 40 qd  HEMATOLOGIC A:  Anemia - due to blood loss from sacral wound + peri op bleeding from debridement, dilution DVT prevention Persistent leukocytosis MAy 2016 PE P:  Trend CBC in am  SCD Transfuse 1 unit Would not restart anticoagulation with PRBC needs, anemia No IVC filter on XR abd or CT abd ,consider placement  INFECTIOUS A:  MRSA bacteremia, neg TEE, likely osteomyelitis source E coli UTI  New HCAP likely Once finish abx course, need to remove all lines  MDR acinatobacter P:  BCx2 7/18 >>  MRSA BCx2 7/21 >>MRSA, strep Wound/tissue culture 7/28 >> Abscess culture 7/28 >> Bronch bal 7/31 >> MDR acint   Vancomycin 7/18>>> Aztreonam 7/18>>>8/3 8/1 cipro>>>8/3 8/3 unasyn>>>  Change to unasyn Isolation important  ENDOCRINE A:  Possible adrenal insufficiency  T1 DM hypoglycemia P:  SSI reduce hydrocortisone  NEUROLOGIC A: Quad post trauma Post op pain  Acute encephalopathy 7/31 > multifactorial, due to sepsis, anemia, hypotension, pain medications P:  RASS goal 0 PRN fentanyl WUA  Addn Ccm time 35 min    Summary - Reintubated due tto RLL collapse, quad, will need trach if aggressive care  Kara Mead MD. FCCP. [facility] Pager [phone] If no response call [phone]

## 2015-05-12 NOTE — Procedures (Signed)
Bedside Tracheostomy Insertion Procedure Note   Patient Details:   Name: Jason [name] DOB: 1986/09/28 MRN: [id number]  Procedure: Tracheostomy  Pre Procedure Assessment: ET Tube Size:8.0  ET Tube secured at lip (cm):25 Bite block in place: No Breath Sounds: Clear  Post Procedure Assessment: BP 90/55 mmHg  Pulse 79  Temp(Src) 97.5 F (36.4 C) (Oral)  Resp 22  Ht 5\' 8"  (1.727 m)  Wt 156 lb 12.8 oz (71.124 kg)  BMI 23.85 kg/m2  SpO2 100% O2 sats: stable throughout Complications: No apparent complications Patient did tolerate procedure well Tracheostomy Brand:Shiley Tracheostomy Style:Cuffed Tracheostomy Size: 8.0 Tracheostomy Secured XNT:[id number] Tracheostomy Placement Confirmation:Trach cuff visualized and in place and Chest X ray ordered for placement    Mickie Hillier 05/12/2015, 3:37 PM

## 2015-05-12 NOTE — Progress Notes (Signed)
ABG obtained on patient 1 hour after intubation.  Patients C02 was 30 called the box to report due to rate of 20 pt was currently on. MD said decrease to 16.  I also decreased fIO2 due to high P02

## 2015-05-12 NOTE — Progress Notes (Signed)
[facility] ADULT ICU REPLACEMENT PROTOCOL FOR AM LAB REPLACEMENT ONLY  The patient does apply for the [facility] Adult ICU Electrolyte Replacment Protocol based on the criteria listed below:   1. Is GFR >/= 40 ml/min? Yes.    Patient's GFR today is >60 2. Is urine output >/= 0.5 ml/kg/hr for the last 6 hours? Yes.   Patient's UOP is 1.05 ml/kg/hr 3. Is BUN < 60 mg/dL? Yes.    Patient's BUN today is 17 4. Abnormal electrolyte(s): Potassium 5. Ordered repletion with: Potassium per Protocol 6. If a panic level lab has been reported, has the CCM MD in charge been notified? Yes.  .   Physician:  Dr. Cassandria Anger, Naythan Douthit P 05/12/2015 6:55 AM

## 2015-05-13 ENCOUNTER — Inpatient Hospital Stay (HOSPITAL_COMMUNITY): Payer: Medicaid Other

## 2015-05-13 ENCOUNTER — Encounter (HOSPITAL_COMMUNITY): Payer: Self-pay | Admitting: Radiology

## 2015-05-13 DIAGNOSIS — I82409 Acute embolism and thrombosis of unspecified deep veins of unspecified lower extremity: Secondary | ICD-10-CM | POA: Insufficient documentation

## 2015-05-13 DIAGNOSIS — J158 Pneumonia due to other specified bacteria: Secondary | ICD-10-CM

## 2015-05-13 DIAGNOSIS — Z881 Allergy status to other antibiotic agents status: Secondary | ICD-10-CM

## 2015-05-13 DIAGNOSIS — Z7189 Other specified counseling: Secondary | ICD-10-CM

## 2015-05-13 DIAGNOSIS — B379 Candidiasis, unspecified: Secondary | ICD-10-CM

## 2015-05-13 LAB — MAGNESIUM: Magnesium: 1.5 mg/dL — ABNORMAL LOW (ref 1.7–2.4)

## 2015-05-13 LAB — GLUCOSE, CAPILLARY
GLUCOSE-CAPILLARY: 125 mg/dL — AB (ref 65–99)
GLUCOSE-CAPILLARY: 92 mg/dL (ref 65–99)
Glucose-Capillary: 185 mg/dL — ABNORMAL HIGH (ref 65–99)
Glucose-Capillary: 108 mg/dL — ABNORMAL HIGH (ref 65–99)
Glucose-Capillary: 118 mg/dL — ABNORMAL HIGH (ref 65–99)
Glucose-Capillary: 123 mg/dL — ABNORMAL HIGH (ref 65–99)
Glucose-Capillary: 134 mg/dL — ABNORMAL HIGH (ref 65–99)

## 2015-05-13 LAB — CBC WITH DIFFERENTIAL/PLATELET
BASOS ABS: 0 10*3/uL (ref 0.0–0.1)
Basophils Relative: 0 % (ref 0–1)
Eosinophils Absolute: 0 10*3/uL (ref 0.0–0.7)
Eosinophils Relative: 0 % (ref 0–5)
HEMATOCRIT: 22.9 % — AB (ref 39.0–52.0)
Hemoglobin: 8 g/dL — ABNORMAL LOW (ref 13.0–17.0)
LYMPHS ABS: 2.4 10*3/uL (ref 0.7–4.0)
Lymphocytes Relative: 9 % — ABNORMAL LOW (ref 12–46)
MCH: 29.4 pg (ref 26.0–34.0)
MCHC: 34.9 g/dL (ref 30.0–36.0)
MCV: 84.2 fL (ref 78.0–100.0)
MONOS PCT: 7 % (ref 3–12)
Monocytes Absolute: 1.9 10*3/uL — ABNORMAL HIGH (ref 0.1–1.0)
Neutro Abs: 22.5 10*3/uL — ABNORMAL HIGH (ref 1.7–7.7)
Neutrophils Relative %: 84 % — ABNORMAL HIGH (ref 43–77)
Platelets: 271 10*3/uL (ref 150–400)
RBC: 2.72 MIL/uL — AB (ref 4.22–5.81)
RDW: 16.8 % — ABNORMAL HIGH (ref 11.5–15.5)
WBC: 26.8 10*3/uL — ABNORMAL HIGH (ref 4.0–10.5)

## 2015-05-13 LAB — BASIC METABOLIC PANEL
Anion gap: 7 (ref 5–15)
BUN: 13 mg/dL (ref 6–20)
CALCIUM: 7.4 mg/dL — AB (ref 8.9–10.3)
CHLORIDE: 111 mmol/L (ref 101–111)
CO2: 21 mmol/L — ABNORMAL LOW (ref 22–32)
CREATININE: 0.46 mg/dL — AB (ref 0.61–1.24)
GFR calc Af Amer: >60 mL/min (ref >60)
GFR calc non Af Amer: >60 mL/min (ref >60)
GLUCOSE: 124 mg/dL — AB (ref 65–99)
Potassium: 3.8 mmol/L (ref 3.5–5.1)
Sodium: 139 mmol/L (ref 135–145)

## 2015-05-13 LAB — PHOSPHORUS: Phosphorus: 3.1 mg/dL (ref 2.5–4.6)

## 2015-05-13 MED ORDER — ANTISEPTIC ORAL RINSE SOLUTION (CORINZ)
7.0000 mL | Freq: Four times a day (QID) | OROMUCOSAL | Status: DC
  Administered 2015-05-13 – 2015-06-01 (×68): 7 mL via OROMUCOSAL

## 2015-05-13 MED ORDER — FENTANYL 50 MCG/HR TD PT72
50.0000 ug | MEDICATED_PATCH | TRANSDERMAL | Status: DC
  Administered 2015-05-13 – 2015-05-31 (×7): 50 ug via TRANSDERMAL
  Filled 2015-05-13 (×7): qty 1

## 2015-05-13 MED ORDER — MAGNESIUM SULFATE 4 GM/100ML IV SOLN
4.0000 g | Freq: Once | INTRAVENOUS | Status: AC
  Administered 2015-05-13: 4 g via INTRAVENOUS
  Filled 2015-05-13: qty 100

## 2015-05-13 MED ORDER — SODIUM CHLORIDE 0.9 % IJ SOLN
3.0000 mL | Freq: Two times a day (BID) | INTRAMUSCULAR | Status: DC
  Administered 2015-05-14 – 2015-06-01 (×27): 3 mL

## 2015-05-13 MED ORDER — CHLORHEXIDINE GLUCONATE 0.12% ORAL RINSE (MEDLINE KIT)
15.0000 mL | Freq: Two times a day (BID) | OROMUCOSAL | Status: DC
  Administered 2015-05-14 – 2015-05-24 (×21): 15 mL via OROMUCOSAL
  Filled 2015-05-13 (×4): qty 15

## 2015-05-13 MED ORDER — SODIUM CHLORIDE 0.9 % IJ SOLN
3.0000 mL | INTRAMUSCULAR | Status: DC | PRN
  Administered 2015-05-22 – 2015-05-26 (×2): 3 mL
  Filled 2015-05-13 (×2): qty 3

## 2015-05-13 MED ORDER — MAGNESIUM SULFATE 2 GM/50ML IV SOLN: 2.0000 g | Freq: Once | INTRAVENOUS | Status: DC

## 2015-05-13 NOTE — Progress Notes (Signed)
Wasted 215 ml's of fentanyl with Haig Prophet, RN as witness.

## 2015-05-13 NOTE — Progress Notes (Signed)
PULMONARY / CRITICAL CARE MEDICINE   Name: Jason [name] MRN: [id number] DOB: November 19, 1985    ADMISSION DATE: 04/25/2015 TODAY'S DATE: 05/10/2015  REFERRING MD : TRH  CHIEF COMPLAINT: Hypotension  INITIAL PRESENTATION: 29 year old male quad with MRSA bacteremia from multiple sources. Concern for endocarditis. Wounds have been checked and followed by general surgery and ortho. Patient has history of hypotension and concern for septic shock and levophed was started and PCCM consulted. 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 Now with possible HCAP.   SIGNIFICANT EVENTS: 7/28 >> OR for debridement of osteomyelitis and abscess cx with staph. Positive MRSA and strep blood cx 7/28 >> Reconsulted for extensive post op bleeding and hypotension 7/31 >> worsening respiratory status, acidosis, hypoxemia, hypotension overnight  STUDIES: 7/28 TEE >> NO vegetation, normal LV function  7/31 Bronch >> HCAP, extensive mucous plug 8/2 bronch - no sig obstruction 8/2- family does not want trach at this point even though is recommended 8/3 bronch lungs open, clear, extubated, re intubated lat evening due to collapse 8/4- pt changed decision, trach placed  SUBJECTIVE: Awake alert  VITAL SIGNS: Temp:  [93.1 F (33.9 C)-100.2 F (37.9 C)] 98.9 F (37.2 C) (08/05 0747) Pulse Rate:  [74-112] 98 (08/05 0800) Resp:  [16-39] 19 (08/05 0800) BP: (73-128)/(41-75) 106/60 mmHg (08/05 0800) SpO2:  [89 %-100 %] 91 % (08/05 0802) FiO2 (%):  [40 %-60 %] 40 % (08/05 0802) Weight:  [72.349 kg (159 lb 8 oz)] 72.349 kg (159 lb 8 oz) (08/05 0320) HEMODYNAMICS:   VENTILATOR SETTINGS: Vent Mode:  [-] PRVC FiO2 (%):  [40 %-60 %] 40 % Set Rate:  [16 bmp] 16 bmp Vt Set:  [550 mL] 550 mL PEEP:  [5 cmH20] 5 cmH20 Pressure Support:  [5 cmH20] 5 cmH20 Plateau Pressure:  [24 cmH20-29 cmH20] 26 cmH20 INTAKE / OUTPUT:  Intake/Output Summary (Last 24 hours) at  05/13/15 0816 Last data filed at 05/13/15 0600  Gross per 24 hour  Intake 667.34 ml  Output    850 ml  Net -182.66 ml    PHYSICAL EXAMINATION:  Gen: chronically ill appearing, rass 0 HENT: trach clean, dry PULM: coarse reduced bases CV: RRR, no mgr, edema arms, feet GI: BS+, soft, nontender Derm:  sacral wound stage 4 plus Neuro:  rass 0,follows commands   LABS:  CBC  Recent Labs Lab 05/11/15 0511 05/12/15 0607 05/13/15 0513  WBC 21.3* 27.8* 26.8*  HGB 6.8* 11.0* 8.0*  HCT 19.0* 31.5* 22.9*  PLT 252 169 271   Coag's  Recent Labs Lab 05/06/15 1600  INR 1.80*   BMET  Recent Labs Lab 05/12/15 0607 05/12/15 2000 05/13/15 0513  NA 138 135 139  K 2.9* 3.9 3.8  CL 111 108 111  CO2 21* 20* 21*  BUN 17 13 13   CREATININE 0.35* 0.43* 0.46*  GLUCOSE 92 164* 124*   Electrolytes  Recent Labs Lab 05/11/15 0821 05/11/15 2100 05/12/15 0607 05/12/15 2000 05/13/15 0513  CALCIUM  --   --  7.4* 7.4* 7.4*  MG 1.6* 1.4* 1.9  --  1.5*  PHOS 2.7 2.7  --   --  3.1   Sepsis Markers  Recent Labs Lab 05/08/15 0847 05/08/15 1040 05/10/15 1443  LATICACIDVEN 1.4 2.0 2.4*   ABG  Recent Labs Lab 05/08/15 0856 05/11/15 2255 05/12/15 0135  PHART 7.385 7.347* 7.431  PCO2ART 31.5* 32.4* 30.0*  PO2ART 156.0* 137.0* 347.0*   Liver Enzymes  Recent  Labs Lab 05/10/15 0536 05/11/15 0511  AST 29 23  ALT 12* 13*  ALKPHOS 105 95  BILITOT 0.3 0.3  ALBUMIN <1.0* <1.0*   Cardiac Enzymes No results for input(s): TROPONINI, PROBNP in the last 168 hours. Glucose  Recent Labs Lab 05/12/15 0841 05/12/15 1141 05/12/15 1626 05/12/15 1932 05/12/15 2329 05/13/15 0419  GLUCAP 117* 133* 138* 145* 108* 125*    Imaging 8/3 CXR >RLL collapse    ASSESSMENT / PLAN:  PULMONARY A:  Acute hypoxemia respiratory failure > due to pulm edema vs HCAP with atelectasis > currently not compensating for metabolic acidosis; consider TRALI if no improvement with positive  pressure Pleural effusion R base, small Collapse rt base RML, RLL< improved post bronch- reintubated ARDS S/p TARch P:  Wean cpap 5 ps 5, goal 2 hr, 50% Would NOT trach collar, needs pos pressure Chest pt Will d/w ID neb abx>? pcxr in am for collapse   CARDIOVASCULAR Right Arm PICC 7/19>>> A:  Shock resolved Relative adrenal insufficiency P:  Reduce steroids further to improve wound healing, to qday then dc Continue Midodrine 20mg  PO TID Dc aline  RENAL A:  hypomag P:  Mag supp kvo Chem in am    GASTROINTESTINAL OGT A:  Severe malnutrition P:  Restart TF Zofran PRN nausea PPI 40 qd Will need peg likely, consider for Monday, he did swallow prior to this  HEMATOLOGIC A:  Anemia - due to blood loss from sacral wound + peri op bleeding from debridement, dilution DVT prevention Persistent leukocytosis MAy 2016 PE P:  Trend CBC in am  SCD Will place IVC filter today  May need more blood in am   INFECTIOUS A:  MRSA bacteremia, neg TEE, likely osteomyelitis source E coli UTI  New HCAP likely Once finish abx course, need to remove all lines  MDR acinatobacter P:  BCx2 7/18 >>  MRSA BCx2 7/21 >>MRSA, strep Wound/tissue culture 7/28 >> Abscess culture 7/28 >> Bronch bal 7/31 >> MDR acint   Vancomycin 7/18>>> Aztreonam 7/18>>>8/3 8/1 cipro>>>8/3 8/3 gent >>> 8/3 tygacyclin>>>  Poor prognosis,consider tobi nebs?  ENDOCRINE A:  Possible adrenal insufficiency  T1 DM hypoglycemia P:  SSI reduce hydrocortisone in setting woumd, to daily then dc  NEUROLOGIC A: Quad post trauma Post op pain  Acute encephalopathy 7/31 > multifactorial, due to sepsis, anemia, hypotension, pain medications P:  RASS goal 0 Dc fent drip, add fent patch WUA  Ccm time 30 min    Lavon Paganini. Titus Mould, MD, [place] Pgr: [facility]

## 2015-05-13 NOTE — Progress Notes (Signed)
Chief Complaint: Patient was seen in consultation today for placement of IVC filter  at the request of Dr. Titus Mould  Referring Physician(s): Dr. Titus Mould  History of Present Illness: Jason [name] is a 29 y.o. male with complex/complicated medical history. 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 Had a recent PE in 5/16 and had been on Eliquis. Was taken to OR a few days ago for debridement of sacral decubitus and developed significant bleeding. Pt currently septic and respiratory failure s/p trach. Very high risk for DVT and recurrent PE at this time. IR requested to place IVC filter. Chart, PMHx, meds, labs, relevant imaging reviewed. Pt seen at bedside with father present.  Past Medical History  Diagnosis Date  . GERD (gastroesophageal reflux disease)   . Asthma   . Hx MRSA infection     on face  . Gastroparesis   . Diabetic neuropathy   . Seizures   . Family history of anesthesia complication     Pt mother can't have epidural procedures  . Dysrhythmia   . Pneumonia   . Arthritis   . Fibromyalgia   . 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 Type I diabetes mellitus     sees Dr. Loanne Drilling   . Syncope 02/16/2015    Past Surgical History  Procedure Laterality Date  . Tonsillectomy    . Multiple extractions with alveoloplasty N/A 08/03/2014    Procedure: MULTIPLE EXTRACTIONS;  Surgeon: Gae Bon, DDS;  Location: [place];  Service: Oral Surgery;  Laterality: N/A;  . Tee without cardioversion N/A 08/17/2014    Procedure: TRANSESOPHAGEAL ECHOCARDIOGRAM (TEE);  Surgeon: Dorothy Spark, MD;  Location: [facility];  Service: Cardiovascular;  Laterality: N/A;  . Debridment of decubitus ulcer N/A 10/04/2014    Procedure: DEBRIDMENT OF DECUBITUS ULCER;  Surgeon: Georganna Skeans, MD;  Location: [place];  Service: General;  Laterality: N/A;  . Laparoscopic diverted colostomy N/A 10/12/2014    Procedure:  LAPAROSCOPIC DIVERTING COLOSTOMY;  Surgeon: Donnie Mesa, MD;  Location: [place];  Service: General;  Laterality: N/A;  . Insertion of suprapubic catheter N/A 10/12/2014    Procedure: INSERTION OF SUPRAPUBIC CATHETER;  Surgeon: Reece Packer, MD;  Location: [place];  Service: Urology;  Laterality: N/A;  . Incision and drainage of wound N/A 11/12/2014    Procedure: IRRIGATION AND DEBRIDEMENT OF WOUNDS WITH BONE BIOPSY AND SURGICAL PREP ;  Surgeon: Theodoro Kos, DO;  Location: [place];  Service: Plastics;  Laterality: N/A;  . Application of a-cell of back N/A 11/12/2014    Procedure: APPLICATION A CELL AND VAC ;  Surgeon: Theodoro Kos, DO;  Location: [place];  Service: Plastics;  Laterality: N/A;  . Incision and drainage of wound N/A 11/18/2014    Procedure: IRRIGATION AND DEBRIDEMENT OF SACRAL ULCER ONLY WITH PLACEMENT OF A CELL AND VAC/ DRESSING CHANGE TO UPPER BACK AREA.;  Surgeon: Theodoro Kos, DO;  Location: [place];  Service: Plastics;  Laterality: N/A;  . Incision and drainage of wound N/A 11/25/2014    Procedure: IRRIGATION AND DEBRIDEMENT OF SACRAL ULCER AND BACK BURN WITH PLACEMENT OF A-CELL;  Surgeon: Theodoro Kos, DO;  Location: [place];  Service: Plastics;  Laterality: N/A;  . Minor application of wound vac N/A 11/25/2014    Procedure:  WOUND VAC CHANGE;  Surgeon: Theodoro Kos, DO;  Location: [place];  Service: Plastics;  Laterality: N/A;  . Incision and drainage  of wound Right 12/08/2014    Procedure: IRRIGATION AND DEBRIDEMENT SACRAL WOUND AND RIGHT ISCHIAL WOUND ;  Surgeon: Theodoro Kos, DO;  Location: [place];  Service: Plastics;  Laterality: Right;  . Application of a-cell of back N/A 12/08/2014    Procedure: APPLICATION OF A-CELL AND WOUND VAC ;  Surgeon: Theodoro Kos, DO;  Location: [place];  Service: Plastics;  Laterality: N/A;  . Tee without cardioversion N/A 05/05/2015    Procedure: TRANSESOPHAGEAL ECHOCARDIOGRAM (TEE);  Surgeon: Lelon Perla, MD;  Location: [place];  Service:  Cardiovascular;  Laterality: N/A;  . Incision and drainage of wound Bilateral 05/05/2015    Procedure: IRRIGATION AND DEBRIDEMENT SACRAL ULCER;  Surgeon: Theodoro Kos, DO;  Location: [place];  Service: Plastics;  Laterality: Bilateral;  . Hematoma evacuation N/A 05/05/2015    Procedure: EVACUATION HEMATOMA bedside procedure;  Surgeon: Theodoro Kos, DO;  Location: [place];  Service: Plastics;  Laterality: N/A;    Allergies: Cefuroxime axetil; Ertapenem; Morphine and related; Penicillins; Sulfa antibiotics; Tessalon; Shellfish allergy; and Miripirium  Medications:  Current facility-administered medications:  .  Place/Maintain arterial line, , , Until Discontinued **AND** 0.9 %  sodium chloride infusion, , Intra-arterial, PRN, Hosie Poisson, MD, Last Rate: 10 mL/hr at 05/07/15 2303 .  0.9 %  sodium chloride infusion, , Intravenous, Once, Juanito Doom, MD .  0.9 %  sodium chloride infusion, , Intravenous, Continuous, Raylene Miyamoto, MD .  acetaminophen (TYLENOL) tablet 650 mg, 650 mg, Oral, Q6H PRN, 650 mg at 05/05/15 2323 **OR** acetaminophen (TYLENOL) suppository 650 mg, 650 mg, Rectal, Q6H PRN, Oswald Hillock, MD .  albuterol (PROVENTIL) (2.5 MG/3ML) 0.083% nebulizer solution 2.5 mg, 2.5 mg, Nebulization, Q4H PRN, Oswald Hillock, MD .  albuterol (PROVENTIL) (2.5 MG/3ML) 0.083% nebulizer solution 2.5 mg, 2.5 mg, Nebulization, Q4H, Mauri Brooklyn, MD, 2.5 mg at 05/13/15 0802 .  albuterol (PROVENTIL) (2.5 MG/3ML) 0.083% nebulizer solution 2.5 mg, 2.5 mg, Nebulization, Q2H PRN, Juanito Doom, MD, 2.5 mg at 05/13/15 0537 .  antiseptic oral rinse (CPC / CETYLPYRIDINIUM CHLORIDE 0.05%) solution 7 mL, 7 mL, Mouth Rinse, QID, Juanito Doom, MD, 7 mL at 05/13/15 0316 .  antiseptic oral rinse solution (CORINZ), 7 mL, Mouth Rinse, QID, Raylene Miyamoto, MD .  baclofen (LIORESAL) tablet 10 mg, 10 mg, Oral, BID WC, Oswald Hillock, MD, 10 mg at 05/13/15 0739 .  baclofen (LIORESAL) tablet 20 mg, 20 mg,  Oral, QHS, Oswald Hillock, MD, 20 mg at 05/12/15 2146 .  chlorhexidine (PERIDEX) 0.12 % solution 15 mL, 15 mL, Mouth Rinse, BID, Juanito Doom, MD, 15 mL at 05/13/15 0739 .  [START ON 05/14/2015] chlorhexidine gluconate (PERIDEX) 0.12 % solution 15 mL, 15 mL, Mouth Rinse, BID, Raylene Miyamoto, MD .  etomidate (AMIDATE) injection 40 mg, 40 mg, Intravenous, Once, Raylene Miyamoto, MD, 40 mg at 05/12/15 1300 .  feeding supplement (VITAL AF 1.2 CAL) liquid 1,000 mL, 1,000 mL, Per Tube, Continuous, Dale Norwalk, RD, Stopped at 05/11/15 0700 .  fentaNYL (DURAGESIC - dosed mcg/hr) 50 mcg, 50 mcg, Transdermal, Q72H, Raylene Miyamoto, MD .  fentaNYL (SUBLIMAZE) injection 100 mcg, 100 mcg, Intravenous, Q15 min PRN, Juanito Doom, MD .  fentaNYL (SUBLIMAZE) injection 100 mcg, 100 mcg, Intravenous, Q2H PRN, Juanito Doom, MD, 100 mcg at 05/09/15 0204 .  gentamicin (GARAMYCIN) 520 mg in dextrose 5 % 100 mL IVPB, 7 mg/kg, Intravenous, Q48H, Veronda P Bryk, RPH .  insulin aspart (novoLOG)  injection 0-15 Units, 0-15 Units, Subcutaneous, 6 times per day, Raylene Miyamoto, MD, 2 Units at 05/13/15 [phone] .  loratadine (CLARITIN) tablet 10 mg, 10 mg, Oral, QHS, Oswald Hillock, MD, 10 mg at 05/12/15 2146 .  magnesium sulfate IVPB 4 g 100 mL, 4 g, Intravenous, Once, Raylene Miyamoto, MD, 4 g at 05/13/15 0908 .  midazolam (VERSED) injection 2 mg, 2 mg, Intravenous, Q15 min PRN, Raylene Miyamoto, MD, 2 mg at 05/09/15 0934 .  midazolam (VERSED) injection 2 mg, 2 mg, Intravenous, Q2H PRN, Raylene Miyamoto, MD, 2 mg at 05/13/15 0108 .  midodrine (PROAMATINE) tablet 20 mg, 20 mg, Oral, TID WC, Hosie Poisson, MD, 20 mg at 05/13/15 0738 .  mirtazapine (REMERON) tablet 7.5 mg, 7.5 mg, Oral, QHS, Oswald Hillock, MD, 7.5 mg at 05/12/15 2146 .  ondansetron (ZOFRAN-ODT) disintegrating tablet 8 mg, 8 mg, Oral, Q8H PRN, Oswald Hillock, MD, 8 mg at 04/25/15 2148 .  oxyCODONE (Oxy IR/ROXICODONE) immediate release  tablet 10 mg, 10 mg, Oral, Q4H PRN, Rhetta Mura Schorr, NP, 10 mg at 05/07/15 0208 .  pantoprazole sodium (PROTONIX) 40 mg/20 mL oral suspension 40 mg, 40 mg, Per Tube, Daily, Grace Bushy Minor, NP, 40 mg at 05/12/15 1009 .  pregabalin (LYRICA) capsule 100 mg, 100 mg, Oral, TID AC, Oswald Hillock, MD, 100 mg at 05/13/15 0739 .  pregabalin (LYRICA) capsule 200 mg, 200 mg, Oral, QHS, Modena Jansky, MD, 200 mg at 05/12/15 2146 .  sodium chloride 0.9 % injection 10-40 mL, 10-40 mL, Intracatheter, Q12H, Oswald Hillock, MD, 10 mL at 05/13/15 0909 .  sodium chloride 0.9 % injection 10-40 mL, 10-40 mL, Intracatheter, PRN, Oswald Hillock, MD, 10 mL at 05/07/15 1008 .  [COMPLETED] tigecycline (TYGACIL) 100 mg in sodium chloride 0.9 % 100 mL IVPB, 100 mg, Intravenous, Once, 100 mg at 05/11/15 1413 **FOLLOWED BY** tigecycline (TYGACIL) 50 mg in sodium chloride 0.9 % 100 mL IVPB, 50 mg, Intravenous, Q12H, Truman Hayward, MD, 50 mg at 05/12/15 2336 .  vancomycin (VANCOCIN) IVPB 750 mg/150 ml premix, 750 mg, Intravenous, Q48H, Franky Macho, RPH, 750 mg at 05/12/15 0048 .  vecuronium (NORCURON) injection 10 mg, 10 mg, Intravenous, Once, Raylene Miyamoto, MD, 10 mg at 05/12/15 1300    Family History  Problem Relation Age of Onset  . Diabetes Father   . Hypertension Father   . Asthma      fhx  . Hypertension      fhx  . Stroke      fhx  . Heart disease Mother     History   Social History  . Marital Status: Single    Spouse Name: N/A  . Number of Children: 0  . Years of Education: 11   Occupational History  . Disability    Social History Main Topics  . Smoking status: Former Smoker -- 1.00 packs/day for 12 years    Types: Cigars, Cigarettes    Quit date: 09/07/2014  . Smokeless tobacco: Never Used  . Alcohol Use: No  . Drug Use: No  . Sexual Activity: Not Currently   Other Topics Concern  . None   Social History Narrative   Patient lives at home with mother father.    Patient has 2  children.    Patient is single.    Patient was left hand but after stroke patient is now right handed.    Patient has  11th grade  education.      Review of Systems: A 12 point ROS discussed and pertinent positives are indicated in the HPI above.  All other systems are negative.  Review of Systems  Vital Signs: BP 106/60 mmHg  Pulse 98  Temp(Src) 98.9 F (37.2 C) (Oral)  Resp 19  Ht 5\' 8"  (1.727 m)  Wt 159 lb 8 oz (72.349 kg)  BMI 24.26 kg/m2  SpO2 91%  Physical Exam  Constitutional: He appears well-developed.  HENT:  Head: Normocephalic and atraumatic.  Tracheostomy present  Neck: No JVD present.  Cardiovascular: Normal rate, regular rhythm and normal heart sounds.   Pulmonary/Chest: Breath sounds normal. No respiratory distress.  Abdominal: Soft.    Mallampati Score:  MD Evaluation Airway: Other (comments) Airway comments: tracheostomy now present Heart: WNL Abdomen: WNL Chest/ Lungs: WNL ASA  Classification: 4 Mallampati/Airway Score:  (tracheostomy)    Labs:  CBC:  Recent Labs  05/10/15 0536 05/11/15 0511 05/12/15 0607 05/13/15 0513  WBC 46.9* 21.3* 27.8* 26.8*  HGB 7.6* 6.8* 11.0* 8.0*  HCT 21.4* 19.0* 31.5* 22.9*  PLT 281 252 169 271    COAGS:  Recent Labs  12/02/14 0410 05/06/15 1600  INR 1.10 1.80*    BMP:  Recent Labs  05/11/15 0511 05/12/15 0607 05/12/15 2000 05/13/15 0513  NA 136 138 135 139  K 3.7 2.9* 3.9 3.8  CL 111 111 108 111  CO2 18* 21* 20* 21*  GLUCOSE 178* 92 164* 124*  BUN 22* 17 13 13   CALCIUM 7.7* 7.4* 7.4* 7.4*  CREATININE 0.47* 0.35* 0.43* 0.46*  GFRNONAA >60 >60 >60 >60  GFRAA >60 >60 >60 >60    LIVER FUNCTION TESTS:  Recent Labs  04/27/15 0815 04/28/15 0640 05/10/15 0536 05/11/15 0511  BILITOT 0.5 0.4 0.3 0.3  AST 12* 8* 29 23  ALT 7* 7* 12* 13*  ALKPHOS 206* 173* 105 95  PROT 6.0* 5.3* 4.2* 4.1*  ALBUMIN 1.2* 1.1* <1.0* <1.0*    TUMOR MARKERS: No results for input(s): AFPTM, CEA,  CA199, CHROMGRNA in the last 8760 hours.  Assessment and Plan: Sepsis/Resp failure Recent PE 5/16 Significant bleed risk if anticoagulated. Agree with indication for IVC filter. Risks and Benefits discussed with the patient including, but not limited to bleeding, infection, contrast induced renal failure, filter fracture or migration which can lead to emergency surgery or even death, strut penetration with damage or irritation to adjacent structures and caval thrombosis. All of the patient's questions were answered, patient is agreeable to proceed. Consent signed and in chart.   SignedAscencion Dike 05/13/2015, 10:03 AM   I spent a total of 20 Minutes  in face to face in clinical consultation, greater than 50% of which was counseling/coordinating care for placement of inferior vena cava filter.

## 2015-05-13 NOTE — Progress Notes (Signed)
[facility]    Date of Admission:  04/25/2015   Total days of antibiotics 19 Day 19 Vanc Day 3 Tigecycline Day 3 Gentamycin          Active Problems:   Type 1 diabetes, uncontrolled, with neuropathy   Spinal cord infarction (history of)   Sepsis   Stage IV decubitus ulcer   Urinary tract infectious disease   HCAP (healthcare-associated pneumonia)   Hypotension   Abdominopelvic abscess   Hemorrhagic shock   Acute blood loss anemia   Edema   MRSA bacteremia   Collapse, lung   Pulmonary emboli   Infection due to acinetobacter baumannii   VAP (ventilator-associated pneumonia)   Acute respiratory failure with hypoxemia   Goals of care, counseling/discussion   DVT (deep venous thrombosis)   . sodium chloride   Intravenous Once  . albuterol  2.5 mg Nebulization Q4H  . antiseptic oral rinse  7 mL Mouth Rinse QID  . antiseptic oral rinse  7 mL Mouth Rinse QID  . baclofen  10 mg Oral BID WC  . baclofen  20 mg Oral QHS  . chlorhexidine  15 mL Mouth Rinse BID  . [START ON 05/14/2015] chlorhexidine gluconate  15 mL Mouth Rinse BID  . etomidate  40 mg Intravenous Once  . fentaNYL  50 mcg Transdermal Q72H  . gentamicin  7 mg/kg Intravenous Q48H  . insulin aspart  0-15 Units Subcutaneous 6 times per day  . loratadine  10 mg Oral QHS  . midodrine  20 mg Oral TID WC  . mirtazapine  7.5 mg Oral QHS  . pantoprazole sodium  40 mg Per Tube Daily  . pregabalin  100 mg Oral TID AC  . pregabalin  200 mg Oral QHS  . sodium chloride  10-40 mL Intracatheter Q12H  . tigecycline (TYGACIL) IVPB  50 mg Intravenous Q12H  . vancomycin  750 mg Intravenous Q48H  . vecuronium  10 mg Intravenous Once     Subjective: Patient is much more alert as he is now no longer intubated and requiring sedation. Denies any complaints. Is able to communicate through the communication board.   Review of Systems: Nods yes or no to questions on exam: Nods yes to pain in area of decub ulcer Nods no to feeling chest pain. Quadriplegic   Past Medical History  Diagnosis Date  . GERD (gastroesophageal reflux disease)   . Asthma   . Hx MRSA infection     on face  . Gastroparesis   . Diabetic neuropathy   . Seizures   . Family history of anesthesia complication     Pt mother can't have epidural procedures  . Dysrhythmia   . Pneumonia   . Arthritis   . Fibromyalgia   . 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 Type I diabetes mellitus     sees Dr. Loanne Drilling   . Syncope 02/16/2015    History  Substance Use Topics  . Smoking status: Former Smoker -- 1.00 packs/day for 12 years    Types: Cigars, Cigarettes    Quit date: 09/07/2014  . Smokeless tobacco: Never Used  . Alcohol Use: No    Family History  Problem Relation Age of Onset  . Diabetes Father   . Hypertension Father   . Asthma      fhx  . Hypertension      fhx  . Stroke  fhx  . Heart disease Mother    Allergies  Allergen Reactions  . Cefuroxime Axetil Anaphylaxis  . Ertapenem Other (See Comments)    Rash and confusion  . Morphine And Related Other (See Comments)    Changed mental status, confusion, headache, visual hallucination  . Penicillins Anaphylaxis and Other (See Comments)    ?can take amoxicillin?  . Sulfa Antibiotics Anaphylaxis, Shortness Of Breath and Other (See Comments)  . Tessalon [Benzonatate] Anaphylaxis  . Shellfish Allergy Itching and Other (See Comments)    Took benadryl to alleviate reaction  . Miripirium Rash and Other (See Comments)    Change in mental status    OBJECTIVE: Blood pressure 96/59, pulse 96, temperature 98.9 F (37.2 C), temperature source Oral, resp. rate 24,  height 5\' 8"  (1.727 m), weight 159 lb 8 oz (72.349 kg), SpO2 98 %. General: Alert, oriented, communicating with communication board Skin: Dressing to sacral wound C/D/I Lungs: Course throughout. Trach present. CV: RRR, +4 edema throughout Abdomen: Ostomy pink and moist. Bowel sounds active X4.  Neuro: C6 injury with quadriplegia  Lab Results Lab Results  Component Value Date   WBC 26.8* 05/13/2015   HGB 8.0* 05/13/2015   HCT 22.9* 05/13/2015   MCV 84.2 05/13/2015   PLT 271 05/13/2015    Lab Results  Component Value Date   CREATININE 0.46* 05/13/2015   BUN 13 05/13/2015   NA 139 05/13/2015   K 3.8 05/13/2015   CL 111 05/13/2015   CO2 21* 05/13/2015    Lab Results  Component Value Date   ALT 13* 05/11/2015   AST 23 05/11/2015   ALKPHOS 95 05/11/2015   BILITOT 0.3 05/11/2015     Microbiology: Recent Results (from the past 240 hour(s))  Culture, blood (routine x 2)     Status: None   Collection Time: 05/04/15  6:30 PM  Result Value Ref Range Status   Specimen Description BLOOD PICC LINE  Final   Special Requests BOTTLES DRAWN AEROBIC AND ANAEROBIC 5ML  Final   Culture   Final    NO GROWTH 5 DAYS Performed at [facility]    Report Status 05/09/2015 FINAL  Final  Culture, blood (routine x 2)     Status: None   Collection Time: 05/04/15  6:30 PM  Result Value Ref Range Status   Specimen Description BLOOD PICC LINE  Final   Special Requests BOTTLES DRAWN AEROBIC AND ANAEROBIC 5ML  Final   Culture   Final    NO GROWTH 5 DAYS Performed at [facility]    Report Status 05/09/2015 FINAL  Final  Anaerobic culture     Status: None   Collection Time: 05/05/15  1:01 PM  Result Value Ref Range Status   Specimen Description ABSCESS  Final   Special Requests SPEC A ON SWAB RIGHT PELVIC ABSCESS  Final   Gram Stain   Final    FEW WBC PRESENT, PREDOMINANTLY MONONUCLEAR NO SQUAMOUS EPITHELIAL CELLS SEEN FEW GRAM POSITIVE COCCI IN PAIRS IN CLUSTERS Performed  at [facility]    Culture   Final    NO ANAEROBES ISOLATED Performed at [facility]    Report Status 05/10/2015 FINAL  Final  Culture, routine-abscess     Status: None   Collection Time: 05/05/15  1:01 PM  Result Value Ref Range Status   Specimen Description ABSCESS  Final   Special Requests SPEC A ON SWAB RIGHT PELVIC ABSCESS  Final   Gram Stain  Final    FEW WBC PRESENT, PREDOMINANTLY MONONUCLEAR NO SQUAMOUS EPITHELIAL CELLS SEEN FEW GRAM POSITIVE COCCI IN PAIRS IN CLUSTERS Performed at [facility]    Culture   Final    MODERATE METHICILLIN RESISTANT STAPHYLOCOCCUS AUREUS Note: RIFAMPIN AND GENTAMICIN SHOULD NOT BE USED AS SINGLE DRUGS FOR TREATMENT OF STAPH INFECTIONS. CRITICAL RESULT CALLED TO, READ BACK BY AND VERIFIED WITH: IREKIA B. 8/1 @216  BY REAMM Performed at [facility]    Report Status 05/09/2015 FINAL  Final   Organism ID, Bacteria METHICILLIN RESISTANT STAPHYLOCOCCUS AUREUS  Final      Susceptibility   Methicillin resistant staphylococcus aureus - MIC*    CLINDAMYCIN >=8 RESISTANT Resistant     ERYTHROMYCIN >=8 RESISTANT Resistant     GENTAMICIN <=0.5 SENSITIVE Sensitive     LEVOFLOXACIN >=8 RESISTANT Resistant     OXACILLIN >=4 RESISTANT Resistant     RIFAMPIN <=0.5 SENSITIVE Sensitive     TRIMETH/SULFA >=320 RESISTANT Resistant     VANCOMYCIN 1 SENSITIVE Sensitive     TETRACYCLINE <=1 SENSITIVE Sensitive     * MODERATE METHICILLIN RESISTANT STAPHYLOCOCCUS AUREUS  Tissue culture     Status: None   Collection Time: 05/05/15  1:13 PM  Result Value Ref Range Status   Specimen Description TISSUE  Final   Special Requests SPEC B IN CUP BONE RIGHT ISCHIUM  Final   Gram Stain   Final    RARE WBC PRESENT, PREDOMINANTLY MONONUCLEAR NO ORGANISMS SEEN Performed at [facility]    Culture   Final    FEW METHICILLIN RESISTANT STAPHYLOCOCCUS AUREUS Note: RIFAMPIN AND GENTAMICIN SHOULD NOT BE USED AS SINGLE DRUGS FOR  TREATMENT OF STAPH INFECTIONS. CRITICAL RESULT CALLED TO, READ BACK BY AND VERIFIED WITH: IREKIA B. 8/1 @216  BY REAMM Performed at [facility]    Report Status 05/09/2015 FINAL  Final   Organism ID, Bacteria METHICILLIN RESISTANT STAPHYLOCOCCUS AUREUS  Final      Susceptibility   Methicillin resistant staphylococcus aureus - MIC*    CLINDAMYCIN >=8 RESISTANT Resistant     ERYTHROMYCIN >=8 RESISTANT Resistant     GENTAMICIN <=0.5 SENSITIVE Sensitive     LEVOFLOXACIN >=8 RESISTANT Resistant     OXACILLIN >=4 RESISTANT Resistant     RIFAMPIN <=0.5 SENSITIVE Sensitive     TRIMETH/SULFA >=320 RESISTANT Resistant     VANCOMYCIN 1 SENSITIVE Sensitive     TETRACYCLINE <=1 SENSITIVE Sensitive     * FEW METHICILLIN RESISTANT STAPHYLOCOCCUS AUREUS  Culture, respiratory (NON-Expectorated)     Status: None   Collection Time: 05/08/15  8:36 AM  Result Value Ref Range Status   Specimen Description TRACHEAL ASPIRATE  Final   Special Requests Normal  Final   Gram Stain   Final    RARE WBC PRESENT, PREDOMINANTLY PMN NO SQUAMOUS EPITHELIAL CELLS SEEN FEW GRAM NEGATIVE RODS RARE GRAM POSITIVE COCCI IN PAIRS RARE YEAST    Culture   Final    ABUNDANT ACINETOBACTER CALCOACETICUS/BAUMANNII COMPLEX Note: TIGECYCLINE  MIC 1 COLISTIN SENSITIVE .0125 ug/mL  ETEST results for this drug are "FOR INVESTIGATIONAL USE ONLY" and should NOT be used for clinical purposes. Performed at [facility]    Report Status 05/12/2015 FINAL  Final   Organism ID, Bacteria ACINETOBACTER CALCOACETICUS/BAUMANNII COMPLEX  Final      Susceptibility   Acinetobacter calcoaceticus/baumannii complex - MIC*    AMPICILLIN >=32 RESISTANT Resistant     AMPICILLIN/SULBACTAM 4 SENSITIVE Sensitive     CEFAZOLIN >=64 RESISTANT  Resistant     CEFTAZIDIME >=64 RESISTANT Resistant     CEFTRIAXONE >=64 RESISTANT Resistant     CIPROFLOXACIN >=4 RESISTANT Resistant     GENTAMICIN <=1 SENSITIVE Sensitive     IMIPENEM 0.5  SENSITIVE Sensitive     PIP/TAZO >=128 RESISTANT Resistant     TOBRAMYCIN <=1 SENSITIVE Sensitive     TRIMETH/SULFA >=320 RESISTANT Resistant     * ABUNDANT ACINETOBACTER CALCOACETICUS/BAUMANNII COMPLEX  Culture, blood (routine x 2)     Status: None   Collection Time: 05/10/15  3:05 PM  Result Value Ref Range Status   Specimen Description Blood  Final   Special Requests Normal  Final   Culture  Setup Time TEST WILL BE CREDITED TEST CANCELLED PER RN   Final   Culture TEST WILL BE CREDITED TEST CANCELLED PER RN   Final   Report Status 05/10/2015 FINAL  Final  Culture, respiratory (NON-Expectorated)     Status: None (Preliminary result)   Collection Time: 05/12/15 12:20 AM  Result Value Ref Range Status   Specimen Description BRONCHIAL ALVEOLAR LAVAGE  Final   Special Requests NONE  Final   Gram Stain   Final    MODERATE WBC PRESENT,BOTH PMN AND MONONUCLEAR RARE SQUAMOUS EPITHELIAL CELLS PRESENT NO ORGANISMS SEEN Performed at [facility]    Culture   Final    FEW YEAST CONSISTENT WITH CANDIDA SPECIES Performed at [facility]    Report Status PENDING  Incomplete    Assessment/Plan: 1. HCAP: Respiratory cx's positive for Acinetobacter with sensitivity to Unasyn, Gentamycin, Imipenem, Tobramycin. He has multiple known allergies to antibiotics including PCN, Sulfa and Ertapenem allergies. Bronch done yesterday and trach placed. BAL culture pending, shows few yeast c/w candida. Continue: - Tigecycline with 100 mg LD, followed by 50mg  q12h - Gentamicin 520 mg (~7mg /kg) q24h - Monitor SCr, UOP, CBC  2. Micro-aerophilic streptococcal and MRSA bacteremia: Blood cultures last drawn 7/28. Will need removal of ALL lines and blood cultures repeated after line removal. He is now off of pressors. No necessary requirement for PICC. Needs central line holiday. - Will D/C PICC line, A line has been D/C'd  - Blood cultures post PICC removal.  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 Daily dressing changes per Plastic rec's.   Unfortunately, Mr. Esterly has a poor prognosis. He has been unable to wean from the vent, multiple infections with MDRO's (questionable endocarditis) and multiple abx allergies. The critical care team discussed goals of care yesterday with the family and the agreement was to trach Mr. Morissette. He continues to require the vent and will need placement in a SNF once he is more stable. He will need aggressive care to avoid any more skin breakdown and respiratory decline.    Tilden Fossa, NP [facility] 05/13/2015, 11:26 AM  INFECTIOUS DISEASE ATTENDING ADDENDUM:     [facility]   Date: 05/13/2015  Patient name: ELDOR CONAWAY  Medical record number: [id number]  Date of birth: 10-Feb-1986    This patient has been seen and discussed with the house staff. Please see their note for complete details. I concur with their findings with the following additions/corrections:  Iktan awake interactive with tracheostomy in place. Had extensive discussion with father at bedside and with [facility]  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  Even that attempt is contingent on his surviving his current acinetobacter VAP and his MRSA. Strep bacteremias.  RN to try to place new pIV and DC PICC,  If so should get a set of blood cultures post PICC removal to document clearance and give a "line holiday" for 72 hours minumum if not longer  Dr Baxter Flattery is covering this weekend for questions.   Rhina [place] 05/13/2015, 7:02 PM

## 2015-05-14 ENCOUNTER — Inpatient Hospital Stay (HOSPITAL_COMMUNITY): Payer: Medicaid Other

## 2015-05-14 ENCOUNTER — Other Ambulatory Visit: Payer: Self-pay | Admitting: Endocrinology

## 2015-05-14 DIAGNOSIS — A498 Other bacterial infections of unspecified site: Secondary | ICD-10-CM

## 2015-05-14 LAB — CBC WITH DIFFERENTIAL/PLATELET
Basophils Absolute: 0.1 10*3/uL (ref 0.0–0.1)
Basophils Relative: 0 % (ref 0–1)
Eosinophils Absolute: 0.3 10*3/uL (ref 0.0–0.7)
Eosinophils Relative: 1 % (ref 0–5)
HEMATOCRIT: 25.6 % — AB (ref 39.0–52.0)
Hemoglobin: 9 g/dL — ABNORMAL LOW (ref 13.0–17.0)
LYMPHS ABS: 2.6 10*3/uL (ref 0.7–4.0)
LYMPHS PCT: 9 % — AB (ref 12–46)
MCH: 30.1 pg (ref 26.0–34.0)
MCHC: 35.2 g/dL (ref 30.0–36.0)
MCV: 85.6 fL (ref 78.0–100.0)
MONOS PCT: 8 % (ref 3–12)
Monocytes Absolute: 2.4 10*3/uL — ABNORMAL HIGH (ref 0.1–1.0)
NEUTROS PCT: 83 % — AB (ref 43–77)
Neutro Abs: 25 10*3/uL — ABNORMAL HIGH (ref 1.7–7.7)
Platelets: 225 10*3/uL (ref 150–400)
RBC: 2.99 MIL/uL — ABNORMAL LOW (ref 4.22–5.81)
RDW: 16.5 % — ABNORMAL HIGH (ref 11.5–15.5)
WBC: 30.3 10*3/uL — ABNORMAL HIGH (ref 4.0–10.5)

## 2015-05-14 LAB — GLUCOSE, CAPILLARY
GLUCOSE-CAPILLARY: 88 mg/dL (ref 65–99)
Glucose-Capillary: 89 mg/dL (ref 65–99)
Glucose-Capillary: 87 mg/dL (ref 65–99)
Glucose-Capillary: 87 mg/dL (ref 65–99)
Glucose-Capillary: 85 mg/dL (ref 65–99)
Glucose-Capillary: 87 mg/dL (ref 65–99)

## 2015-05-14 LAB — BASIC METABOLIC PANEL
Anion gap: 11 (ref 5–15)
BUN: 14 mg/dL (ref 6–20)
CO2: 19 mmol/L — AB (ref 22–32)
Calcium: 7.3 mg/dL — ABNORMAL LOW (ref 8.9–10.3)
Chloride: 111 mmol/L (ref 101–111)
Creatinine, Ser: 0.39 mg/dL — ABNORMAL LOW (ref 0.61–1.24)
GFR calc Af Amer: >60 mL/min (ref >60)
GFR calc non Af Amer: >60 mL/min (ref >60)
GLUCOSE: 71 mg/dL (ref 65–99)
Potassium: 3.6 mmol/L (ref 3.5–5.1)
Sodium: 141 mmol/L (ref 135–145)

## 2015-05-14 LAB — MAGNESIUM: Magnesium: 1.6 mg/dL — ABNORMAL LOW (ref 1.7–2.4)

## 2015-05-14 LAB — VANCOMYCIN, TROUGH: Vancomycin Tr: 6 ug/mL — ABNORMAL LOW (ref 10.0–20.0)

## 2015-05-14 LAB — PHOSPHORUS: PHOSPHORUS: 3.2 mg/dL (ref 2.5–4.6)

## 2015-05-14 MED ORDER — LIDOCAINE HCL 1 % IJ SOLN
INTRAMUSCULAR | Status: AC
  Filled 2015-05-14: qty 20

## 2015-05-14 MED ORDER — IOHEXOL 300 MG/ML  SOLN
100.0000 mL | Freq: Once | INTRAMUSCULAR | Status: AC | PRN
  Administered 2015-05-14: 45 mL via INTRAVENOUS

## 2015-05-14 MED ORDER — VANCOMYCIN HCL IN DEXTROSE 750-5 MG/150ML-% IV SOLN
750.0000 mg | INTRAVENOUS | Status: DC
  Administered 2015-05-14 – 2015-05-17 (×4): 750 mg via INTRAVENOUS
  Filled 2015-05-14 (×5): qty 150

## 2015-05-14 MED ORDER — FENTANYL CITRATE (PF) 100 MCG/2ML IJ SOLN
INTRAMUSCULAR | Status: AC
  Administered 2015-05-14: 12:00:00
  Filled 2015-05-14: qty 2

## 2015-05-14 MED ORDER — MIDAZOLAM HCL 2 MG/2ML IJ SOLN
INTRAMUSCULAR | Status: AC
  Filled 2015-05-14: qty 2

## 2015-05-14 MED ORDER — FENTANYL CITRATE (PF) 100 MCG/2ML IJ SOLN
INTRAMUSCULAR | Status: AC | PRN
  Administered 2015-05-14: 50 ug via INTRAVENOUS

## 2015-05-14 NOTE — Sedation Documentation (Signed)
Patient is resting comfortably. 

## 2015-05-14 NOTE — Progress Notes (Signed)
Pt. Refused CPT.

## 2015-05-14 NOTE — Progress Notes (Signed)
ANTIBIOTIC CONSULT NOTE - FOLLOW UP  Pharmacy Consult for Vancomycin  Indication: MRSA Bacteremia  Allergies  Allergen Reactions  . Cefuroxime Axetil Anaphylaxis  . Ertapenem Other (See Comments)    Rash and confusion  . Morphine And Related Other (See Comments)    Changed mental status, confusion, headache, visual hallucination  . Penicillins Anaphylaxis and Other (See Comments)    ?can take amoxicillin?  . Sulfa Antibiotics Anaphylaxis, Shortness Of Breath and Other (See Comments)  . Tessalon [Benzonatate] Anaphylaxis  . Shellfish Allergy Itching and Other (See Comments)    Took benadryl to alleviate reaction  . Miripirium Rash and Other (See Comments)    Change in mental status    Patient Measurements: Height: 5\' 8"  (172.7 cm) Weight: 159 lb 8 oz (72.349 kg) IBW/kg (Calculated) : 68.4  Vital Signs: Temp: 97.9 F (36.6 C) (08/05 2328) Temp Source: Oral (08/05 2328) BP: 94/60 mmHg (08/05 2200) Pulse Rate: 89 (08/05 2339) Intake/Output from previous day: 08/05 0701 - 08/06 0700 In: 166.8 [I.V.:166.8] Out: 925 [Urine:925] Intake/Output from this shift: Total I/O In: 30 [I.V.:30] Out: -   Labs:  Recent Labs  05/11/15 0511 05/12/15 0607 05/12/15 2000 05/13/15 0513  WBC 21.3* 27.8*  --  26.8*  HGB 6.8* 11.0*  --  8.0*  PLT 252 169  --  271  CREATININE 0.47* 0.35* 0.43* 0.46*   Estimated Creatinine Clearance: 130.6 mL/min (by C-G formula based on Cr of 0.46).  Recent Labs  05/11/15 2100 05/13/15 2325  VANCOTROUGH  --  6*  GENTRANDOM 8.2  --      Microbiology: Recent Results (from the past 720 hour(s))  Urine culture     Status: None   Collection Time: 04/25/15  4:35 PM  Result Value Ref Range Status   Specimen Description URINE, CLEAN CATCH  Final   Special Requests Normal  Final   Culture   Final    >=100,000 COLONIES/mL ESCHERICHIA COLI AZTREONAM SENSITIVE ZONE 32MM Performed at [facility] Performed at [facility]    Report  Status 05/02/2015 FINAL  Final   Organism ID, Bacteria ESCHERICHIA COLI  Final      Susceptibility   Escherichia coli - MIC*    AMPICILLIN <=2 SENSITIVE Sensitive     CEFAZOLIN <=4 SENSITIVE Sensitive     CEFTRIAXONE <=1 SENSITIVE Sensitive     CIPROFLOXACIN >=4 RESISTANT Resistant     GENTAMICIN <=1 SENSITIVE Sensitive     IMIPENEM <=0.25 SENSITIVE Sensitive     NITROFURANTOIN <=16 SENSITIVE Sensitive     TRIMETH/SULFA <=20 SENSITIVE Sensitive     AMPICILLIN/SULBACTAM <=2 SENSITIVE Sensitive     PIP/TAZO <=4 SENSITIVE Sensitive     * >=100,000 COLONIES/mL ESCHERICHIA COLI  Blood culture (routine x 2)     Status: None   Collection Time: 04/25/15  5:00 PM  Result Value Ref Range Status   Specimen Description BLOOD RIGHT ANTECUBITAL  Final   Special Requests BOTTLES DRAWN AEROBIC AND ANAEROBIC 5 CC EA  Final   Culture  Setup Time   Final    GRAM POSITIVE COCCI IN CLUSTERS Gram Stain Report Called to,Read Back By and Verified With: Elveria Rising RN 17:30 04/26/15 ([email or web address]) IN BOTH AEROBIC AND ANAEROBIC BOTTLES    Culture   Final    METHICILLIN RESISTANT STAPHYLOCOCCUS AUREUS MICROAEROPHILIC STREPTOCOCCI Standardized susceptibility testing for this organism is not available. Performed at [facility]    Report Status 04/30/2015 FINAL  Final   Organism ID, Bacteria METHICILLIN  RESISTANT STAPHYLOCOCCUS AUREUS  Final      Susceptibility   Methicillin resistant staphylococcus aureus - MIC*    CIPROFLOXACIN >=8 RESISTANT Resistant     ERYTHROMYCIN >=8 RESISTANT Resistant     GENTAMICIN <=0.5 SENSITIVE Sensitive     OXACILLIN >=4 RESISTANT Resistant     TETRACYCLINE <=1 SENSITIVE Sensitive     VANCOMYCIN <=0.5 SENSITIVE Sensitive     TRIMETH/SULFA >=320 RESISTANT Resistant     CLINDAMYCIN >=8 RESISTANT Resistant     RIFAMPIN <=0.5 SENSITIVE Sensitive     Inducible Clindamycin NEGATIVE Sensitive     * METHICILLIN RESISTANT STAPHYLOCOCCUS AUREUS  Blood culture (routine x 2)      Status: None   Collection Time: 04/25/15  5:16 PM  Result Value Ref Range Status   Specimen Description BLOOD BLOOD RIGHT FOREARM  Final   Special Requests BOTTLES DRAWN AEROBIC AND ANAEROBIC 5 CC EA  Final   Culture  Setup Time   Final    GRAM POSITIVE COCCI IN CLUSTERS CRITICAL RESULT CALLED TO, READ BACK BY AND VERIFIED WITH: S GRAHAM 04/26/15 @ 26 M VESTAL IN BOTH AEROBIC AND ANAEROBIC BOTTLES    Culture   Final    METHICILLIN RESISTANT STAPHYLOCOCCUS AUREUS SUSCEPTIBILITIES PERFORMED ON PREVIOUS CULTURE WITHIN THE LAST 5 DAYS. MICROAEROPHILIC STREPTOCOCCI Standardized susceptibility testing for this organism is not available. Performed at [facility]    Report Status 05/03/2015 FINAL  Final  MRSA PCR Screening     Status: Abnormal   Collection Time: 04/25/15  6:55 PM  Result Value Ref Range Status   MRSA by PCR POSITIVE (A) NEGATIVE Final    Comment:        The GeneXpert MRSA Assay (FDA approved for NASAL specimens only), is one component of a comprehensive MRSA colonization surveillance program. It is not intended to diagnose MRSA infection nor to guide or monitor treatment for MRSA infections. RESULT CALLED TO, READ BACK BY AND VERIFIED WITH: Sharyn Lull REEVES,RN [id number] @ 2043 BY J SCOTTON   Culture, blood (routine x 2)     Status: None   Collection Time: 04/28/15  1:20 PM  Result Value Ref Range Status   Specimen Description BLOOD LEFT ARM  Final   Special Requests IN PEDIATRIC BOTTLE 1CC  Final   Culture  Setup Time   Final    GRAM POSITIVE COCCI IN CLUSTERS AEROBIC BOTTLE ONLY CRITICAL RESULT CALLED TO, READ BACK BY AND VERIFIED WITH: Hetty Blend RN 2222 04/30/15 A BROWNING    Culture   Final    MICROAEROPHILIC STREPTOCOCCI Standardized susceptibility testing for this organism is not available. Performed at [facility]    Report Status 05/03/2015 FINAL  Final  Culture, blood (routine x 2)     Status: None   Collection Time: 04/28/15  5:50 PM   Result Value Ref Range Status   Specimen Description BLOOD PICC LINE  Final   Special Requests BOTTLES DRAWN AEROBIC AND ANAEROBIC 5CC EACH  Final   Culture   Final    NO GROWTH 5 DAYS Performed at [facility]    Report Status 05/03/2015 FINAL  Final  Culture, blood (routine x 2)     Status: None   Collection Time: 05/04/15  6:30 PM  Result Value Ref Range Status   Specimen Description BLOOD PICC LINE  Final   Special Requests BOTTLES DRAWN AEROBIC AND ANAEROBIC 5ML  Final   Culture   Final    NO GROWTH 5 DAYS Performed  at [facility]    Report Status 05/09/2015 FINAL  Final  Culture, blood (routine x 2)     Status: None   Collection Time: 05/04/15  6:30 PM  Result Value Ref Range Status   Specimen Description BLOOD PICC LINE  Final   Special Requests BOTTLES DRAWN AEROBIC AND ANAEROBIC 5ML  Final   Culture   Final    NO GROWTH 5 DAYS Performed at [facility]    Report Status 05/09/2015 FINAL  Final  Anaerobic culture     Status: None   Collection Time: 05/05/15  1:01 PM  Result Value Ref Range Status   Specimen Description ABSCESS  Final   Special Requests SPEC A ON SWAB RIGHT PELVIC ABSCESS  Final   Gram Stain   Final    FEW WBC PRESENT, PREDOMINANTLY MONONUCLEAR NO SQUAMOUS EPITHELIAL CELLS SEEN FEW GRAM POSITIVE COCCI IN PAIRS IN CLUSTERS Performed at [facility]    Culture   Final    NO ANAEROBES ISOLATED Performed at [facility]    Report Status 05/10/2015 FINAL  Final  Culture, routine-abscess     Status: None   Collection Time: 05/05/15  1:01 PM  Result Value Ref Range Status   Specimen Description ABSCESS  Final   Special Requests SPEC A ON SWAB RIGHT PELVIC ABSCESS  Final   Gram Stain   Final    FEW WBC PRESENT, PREDOMINANTLY MONONUCLEAR NO SQUAMOUS EPITHELIAL CELLS SEEN FEW GRAM POSITIVE COCCI IN PAIRS IN CLUSTERS Performed at [facility]    Culture   Final    MODERATE METHICILLIN RESISTANT  STAPHYLOCOCCUS AUREUS Note: RIFAMPIN AND GENTAMICIN SHOULD NOT BE USED AS SINGLE DRUGS FOR TREATMENT OF STAPH INFECTIONS. CRITICAL RESULT CALLED TO, READ BACK BY AND VERIFIED WITH: IREKIA B. 8/1 @216  BY REAMM Performed at [facility]    Report Status 05/09/2015 FINAL  Final   Organism ID, Bacteria METHICILLIN RESISTANT STAPHYLOCOCCUS AUREUS  Final      Susceptibility   Methicillin resistant staphylococcus aureus - MIC*    CLINDAMYCIN >=8 RESISTANT Resistant     ERYTHROMYCIN >=8 RESISTANT Resistant     GENTAMICIN <=0.5 SENSITIVE Sensitive     LEVOFLOXACIN >=8 RESISTANT Resistant     OXACILLIN >=4 RESISTANT Resistant     RIFAMPIN <=0.5 SENSITIVE Sensitive     TRIMETH/SULFA >=320 RESISTANT Resistant     VANCOMYCIN 1 SENSITIVE Sensitive     TETRACYCLINE <=1 SENSITIVE Sensitive     * MODERATE METHICILLIN RESISTANT STAPHYLOCOCCUS AUREUS  Tissue culture     Status: None   Collection Time: 05/05/15  1:13 PM  Result Value Ref Range Status   Specimen Description TISSUE  Final   Special Requests SPEC B IN CUP BONE RIGHT ISCHIUM  Final   Gram Stain   Final    RARE WBC PRESENT, PREDOMINANTLY MONONUCLEAR NO ORGANISMS SEEN Performed at [facility]    Culture   Final    FEW METHICILLIN RESISTANT STAPHYLOCOCCUS AUREUS Note: RIFAMPIN AND GENTAMICIN SHOULD NOT BE USED AS SINGLE DRUGS FOR TREATMENT OF STAPH INFECTIONS. CRITICAL RESULT CALLED TO, READ BACK BY AND VERIFIED WITH: IREKIA B. 8/1 @216  BY REAMM Performed at [facility]    Report Status 05/09/2015 FINAL  Final   Organism ID, Bacteria METHICILLIN RESISTANT STAPHYLOCOCCUS AUREUS  Final      Susceptibility   Methicillin resistant staphylococcus aureus - MIC*    CLINDAMYCIN >=8 RESISTANT Resistant     ERYTHROMYCIN >=8 RESISTANT  Resistant     GENTAMICIN <=0.5 SENSITIVE Sensitive     LEVOFLOXACIN >=8 RESISTANT Resistant     OXACILLIN >=4 RESISTANT Resistant     RIFAMPIN <=0.5 SENSITIVE Sensitive     TRIMETH/SULFA  >=320 RESISTANT Resistant     VANCOMYCIN 1 SENSITIVE Sensitive     TETRACYCLINE <=1 SENSITIVE Sensitive     * FEW METHICILLIN RESISTANT STAPHYLOCOCCUS AUREUS  Culture, respiratory (NON-Expectorated)     Status: None   Collection Time: 05/08/15  8:36 AM  Result Value Ref Range Status   Specimen Description TRACHEAL ASPIRATE  Final   Special Requests Normal  Final   Gram Stain   Final    RARE WBC PRESENT, PREDOMINANTLY PMN NO SQUAMOUS EPITHELIAL CELLS SEEN FEW GRAM NEGATIVE RODS RARE GRAM POSITIVE COCCI IN PAIRS RARE YEAST    Culture   Final    ABUNDANT ACINETOBACTER CALCOACETICUS/BAUMANNII COMPLEX Note: TIGECYCLINE  MIC 1 COLISTIN SENSITIVE .0125 ug/mL  ETEST results for this drug are "FOR INVESTIGATIONAL USE ONLY" and should NOT be used for clinical purposes. Performed at [facility]    Report Status 05/12/2015 FINAL  Final   Organism ID, Bacteria ACINETOBACTER CALCOACETICUS/BAUMANNII COMPLEX  Final      Susceptibility   Acinetobacter calcoaceticus/baumannii complex - MIC*    AMPICILLIN >=32 RESISTANT Resistant     AMPICILLIN/SULBACTAM 4 SENSITIVE Sensitive     CEFAZOLIN >=64 RESISTANT Resistant     CEFTAZIDIME >=64 RESISTANT Resistant     CEFTRIAXONE >=64 RESISTANT Resistant     CIPROFLOXACIN >=4 RESISTANT Resistant     GENTAMICIN <=1 SENSITIVE Sensitive     IMIPENEM 0.5 SENSITIVE Sensitive     PIP/TAZO >=128 RESISTANT Resistant     TOBRAMYCIN <=1 SENSITIVE Sensitive     TRIMETH/SULFA >=320 RESISTANT Resistant     * ABUNDANT ACINETOBACTER CALCOACETICUS/BAUMANNII COMPLEX  Culture, blood (routine x 2)     Status: None   Collection Time: 05/10/15  3:05 PM  Result Value Ref Range Status   Specimen Description Blood  Final   Special Requests Normal  Final   Culture  Setup Time TEST WILL BE CREDITED TEST CANCELLED PER RN   Final   Culture TEST WILL BE CREDITED TEST CANCELLED PER RN   Final   Report Status 05/10/2015 FINAL  Final  Culture, respiratory  (NON-Expectorated)     Status: None (Preliminary result)   Collection Time: 05/12/15 12:20 AM  Result Value Ref Range Status   Specimen Description BRONCHIAL ALVEOLAR LAVAGE  Final   Special Requests NONE  Final   Gram Stain   Final    MODERATE WBC PRESENT,BOTH PMN AND MONONUCLEAR RARE SQUAMOUS EPITHELIAL CELLS PRESENT NO ORGANISMS SEEN Performed at [facility]    Culture   Final    FEW YEAST CONSISTENT WITH CANDIDA SPECIES Performed at [facility]    Report Status PENDING  Incomplete   Assessment: Sub-therapeutic vancomycin trough, troughs have been fairly unpredictable over the past few weeks  Goal of Therapy:  Vancomycin trough level 15-20 mcg/ml  Plan:  -Will go back to vancomycin 750 mg IV q24h -Re-check VT ASAP at steady state  Narda Bonds 05/14/2015,12:24 AM

## 2015-05-14 NOTE — Progress Notes (Signed)
PULMONARY / CRITICAL CARE MEDICINE   Name: Jason [name] MRN: [id number] DOB: [birth year]    ADMISSION DATE: 04/25/2015 TODAY'S DATE: 05/10/2015  REFERRING MD : [facility]  CHIEF COMPLAINT: Hypotension  INITIAL PRESENTATION: 29 year old male quad with MRSA bacteremia from multiple sources. Concern for endocarditis. Wounds have been checked and followed by general surgery and ortho. Patient has history of hypotension and concern for septic shock and levophed was started and PCCM consulted. 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 Now with possible HCAP.   SIGNIFICANT EVENTS: 7/28 >> OR for debridement of osteomyelitis and abscess cx with staph. Positive MRSA and strep blood cx 7/28 >> Reconsulted for extensive post op bleeding and hypotension 7/31 >> worsening respiratory status, acidosis, hypoxemia, hypotension overnight  STUDIES: 7/28 TEE >> NO vegetation, normal LV function  7/31 Bronch >> HCAP, extensive mucous plug 8/2 bronch - no sig obstruction 8/2- family does not want trach at this point even though is recommended 8/3 bronch lungs open, clear, extubated, re intubated lat evening due to collapse 8/4- pt changed decision, trach placed  SUBJECTIVE: did 2h PS 8/5 Denies pain afebrile   VITAL SIGNS: Temp:  [97.6 F (36.4 C)-98.4 F (36.9 C)] 98.4 F (36.9 C) (08/06 0850) Pulse Rate:  [80-103] 91 (08/06 1030) Resp:  [13-33] 16 (08/06 1030) BP: (87-123)/(52-94) 106/75 mmHg (08/06 1030) SpO2:  [92 %-100 %] 98 % (08/06 1030) FiO2 (%):  [40 %] 40 % (08/06 0956) Weight:  [71.7 kg (158 lb 1.1 oz)] 71.7 kg (158 lb 1.1 oz) (08/06 0500) HEMODYNAMICS:   VENTILATOR SETTINGS: Vent Mode:  [-] PRVC FiO2 (%):  [40 %] 40 % Set Rate:  [16 bmp] 16 bmp Vt Set:  [550 mL] 550 mL PEEP:  [5 cmH20] 5 cmH20 Pressure Support:  [10 cmH20] 10 cmH20 Plateau Pressure:  [22 cmH20-25 cmH20] 23 cmH20 INTAKE / OUTPUT:  Intake/Output Summary (Last 24  hours) at 05/14/15 1055 Last data filed at 05/14/15 1000  Gross per 24 hour  Intake    658 ml  Output   1635 ml  Net   -977 ml    PHYSICAL EXAMINATION:  Gen: chronically ill appearing, rass 0 HENT: trach clean, dry PULM: coarse reduced bases CV: RRR, no mgr, edema arms, feet GI: BS+, soft, nontender Derm:  sacral wound stage 4 plus Neuro:  rass 0,follows commands   LABS:  CBC  Recent Labs Lab 05/11/15 0511 05/12/15 0607 05/13/15 0513  WBC 21.3* 27.8* 26.8*  HGB 6.8* 11.0* 8.0*  HCT 19.0* 31.5* 22.9*  PLT 252 169 271   Coag's No results for input(s): APTT, INR in the last 168 hours. BMET  Recent Labs Lab 05/12/15 2000 05/13/15 0513 05/14/15 0600  NA 135 139 141  K 3.9 3.8 3.6  CL 108 111 111  CO2 20* 21* 19*  BUN 13 13 14   CREATININE 0.43* 0.46* 0.39*  GLUCOSE 164* 124* 71   Electrolytes  Recent Labs Lab 05/11/15 2100 05/12/15 0607 05/12/15 2000 05/13/15 0513 05/14/15 0600  CALCIUM  --  7.4* 7.4* 7.4* 7.3*  MG 1.4* 1.9  --  1.5* 1.6*  PHOS 2.7  --   --  3.1 3.2   Sepsis Markers  Recent Labs Lab 05/08/15 0847 05/08/15 1040 05/10/15 1443  LATICACIDVEN 1.4 2.0 2.4*   ABG  Recent Labs Lab 05/08/15 0856 05/11/15 2255 05/12/15 0135  PHART 7.385 7.347* 7.431  PCO2ART 31.5* 32.4* 30.0*  PO2ART 156.0* 137.0* 347.0*  Liver Enzymes  Recent Labs Lab 05/10/15 0536 05/11/15 0511  AST 29 23  ALT 12* 13*  ALKPHOS 105 95  BILITOT 0.3 0.3  ALBUMIN <1.0* <1.0*   Cardiac Enzymes No results for input(s): TROPONINI, PROBNP in the last 168 hours. Glucose  Recent Labs Lab 05/13/15 1130 05/13/15 1557 05/13/15 1917 05/13/15 2325 05/14/15 0338 05/14/15 0851  GLUCAP 118* 123* 134* 89 87 87    Imaging 8/3 CXR >RLL collapse    ASSESSMENT / PLAN:  PULMONARY A:  Acute hypoxemia respiratory failure > due to pulm edema vs HCAP with atelectasis > currently not compensating for metabolic acidosis; consider TRALI if no improvement with  positive pressure Pleural effusion R base, small Collapse rt base RML, RLL< improved post bronch- reintubated Trach 8/5 >> P:  SBts daily  Would NOT trach collar, needs pos pressure Chest pt Will d/w ID neb tobra ?   CARDIOVASCULAR Right Arm PICC 7/19>>> A:  Shock resolved Relative adrenal insufficiency P:  Observe off steroids Continue Midodrine 20mg  PO TID   RENAL A:  hypomag P:  Mag supp kvo Chem in am    GASTROINTESTINAL OGT A:  Severe malnutrition P:  Zofran PRN nausea PPI 40 qd  peg planned   HEMATOLOGIC A:  Anemia - due to blood loss from sacral wound + peri op bleeding from debridement, dilution DVT prevention Persistent leukocytosis MAy 2016 PE P:  Trend CBC in am  SCD  IVC filter planned   INFECTIOUS A:  MRSA bacteremia, neg TEE, likely osteomyelitis source E coli UTI  New HCAP likely Once finish abx course, need to remove all lines  MDR acinatobacter P:  BCx2 7/18 >>  MRSA BCx2 7/21 >>MRSA, strep Wound/tissue culture 7/28 >> Abscess culture 7/28 >> Bronch bal 7/31 >> MDR acint   Vancomycin 7/18>>> Aztreonam 7/18>>>8/3 8/1 cipro>>>8/3 8/3 gent >>> 8/3 tygacyclin>>>  Poor prognosis,consider tobi nebs?  ENDOCRINE A:  Possible adrenal insufficiency  T1 DM hypoglycemia P:  SSI  dc steroids & observe  NEUROLOGIC A: Quad post trauma Post op pain  Acute encephalopathy 7/31 > multifactorial, due to sepsis, anemia, hypotension, pain medications P:  RASS goal 0 ct fent patch & fent prn WUA  The patient is critically ill with multiple organ systems failure and requires high complexity decision making for assessment and support, frequent evaluation and titration of therapies, application of advanced monitoring technologies and extensive interpretation of multiple databases. Critical Care Time devoted to patient care services described in this note independent of APP time is 31 minutes.   Kara Mead  MD. Shade Flood. [facility] Pager [phone] If no response call [phone]

## 2015-05-14 NOTE — Progress Notes (Signed)
CPT not done on pt at this time.  He is in Interventional Radiology for a procedure.

## 2015-05-15 LAB — CBC
HCT: 23.8 % — ABNORMAL LOW (ref 39.0–52.0)
HEMOGLOBIN: 8 g/dL — AB (ref 13.0–17.0)
MCH: 29.5 pg (ref 26.0–34.0)
MCHC: 33.6 g/dL (ref 30.0–36.0)
MCV: 87.8 fL (ref 78.0–100.0)
Platelets: 238 10*3/uL (ref 150–400)
RBC: 2.71 MIL/uL — AB (ref 4.22–5.81)
RDW: 17 % — AB (ref 11.5–15.5)
WBC: 27.3 10*3/uL — ABNORMAL HIGH (ref 4.0–10.5)

## 2015-05-15 LAB — GLUCOSE, CAPILLARY
GLUCOSE-CAPILLARY: 207 mg/dL — AB (ref 65–99)
GLUCOSE-CAPILLARY: 152 mg/dL — AB (ref 65–99)
GLUCOSE-CAPILLARY: 197 mg/dL — AB (ref 65–99)
Glucose-Capillary: 109 mg/dL — ABNORMAL HIGH (ref 65–99)
Glucose-Capillary: 144 mg/dL — ABNORMAL HIGH (ref 65–99)
Glucose-Capillary: 154 mg/dL — ABNORMAL HIGH (ref 65–99)
Glucose-Capillary: 180 mg/dL — ABNORMAL HIGH (ref 65–99)

## 2015-05-15 LAB — BASIC METABOLIC PANEL
ANION GAP: 12 (ref 5–15)
BUN: 16 mg/dL (ref 6–20)
CALCIUM: 7.2 mg/dL — AB (ref 8.9–10.3)
CHLORIDE: 117 mmol/L — AB (ref 101–111)
CO2: 13 mmol/L — ABNORMAL LOW (ref 22–32)
CREATININE: 0.64 mg/dL (ref 0.61–1.24)
GLUCOSE: 182 mg/dL — AB (ref 65–99)
POTASSIUM: 4.8 mmol/L (ref 3.5–5.1)
Sodium: 142 mmol/L (ref 135–145)

## 2015-05-15 LAB — CULTURE, RESPIRATORY

## 2015-05-15 LAB — MAGNESIUM: Magnesium: 1.5 mg/dL — ABNORMAL LOW (ref 1.7–2.4)

## 2015-05-15 LAB — CULTURE, RESPIRATORY W GRAM STAIN

## 2015-05-15 MED ORDER — MAGNESIUM SULFATE 2 GM/50ML IV SOLN
2.0000 g | Freq: Once | INTRAVENOUS | Status: AC
  Administered 2015-05-15: 2 g via INTRAVENOUS
  Filled 2015-05-15: qty 50

## 2015-05-15 NOTE — Progress Notes (Signed)
[place] for vancomycin, gentamicin and tigecycline Indication: pneumonia, MRSA bacteremia/osteo  Allergies  Allergen Reactions  . Cefuroxime Axetil Anaphylaxis  . Ertapenem Other (See Comments)    Rash and confusion  . Morphine And Related Other (See Comments)    Changed mental status, confusion, headache, visual hallucination  . Penicillins Anaphylaxis and Other (See Comments)    ?can take amoxicillin?  . Sulfa Antibiotics Anaphylaxis, Shortness Of Breath and Other (See Comments)  . Tessalon [Benzonatate] Anaphylaxis  . Shellfish Allergy Itching and Other (See Comments)    Took benadryl to alleviate reaction  . Miripirium Rash and Other (See Comments)    Change in mental status    Patient Measurements: Height: 5\' 8"  (172.7 cm) Weight: 158 lb 11.7 oz (72 kg) IBW/kg (Calculated) : 68.4  Vital Signs: Temp: 98.4 F (36.9 C) (08/07 1222) Temp Source: Oral (08/07 1222) BP: 92/54 mmHg (08/07 1230) Pulse Rate: 93 (08/07 1230) Intake/Output from previous day: 08/06 0701 - 08/07 0700 In: 1425 [I.V.:225; NG/GT:850; IV Piggyback:350] Out: 1245 [Urine:520; Stool:725] Intake/Output from this shift:    Labs:  Recent Labs  05/13/15 0513 05/14/15 0600 05/14/15 2005 05/15/15 0443  WBC 26.8*  --  30.3* 27.3*  HGB 8.0*  --  9.0* 8.0*  PLT 271  --  225 238  CREATININE 0.46* 0.39*  --  0.64   Estimated Creatinine Clearance: 130.6 mL/min (by C-G formula based on Cr of 0.64).  Recent Labs  05/13/15 2325  [place] 6*     Microbiology: Recent Results (from the past 720 hour(s))  Urine culture     Status: None   Collection Time: 04/25/15  4:35 PM  Result Value Ref Range Status   Specimen Description URINE, CLEAN CATCH  Final   Special Requests Normal  Final   Culture   Final    >=100,000 COLONIES/mL ESCHERICHIA COLI AZTREONAM SENSITIVE ZONE 32MM Performed at [facility] Performed at [facility]    Report Status  05/02/2015 FINAL  Final   Organism ID, Bacteria ESCHERICHIA COLI  Final      Susceptibility   Escherichia coli - MIC*    AMPICILLIN <=2 SENSITIVE Sensitive     CEFAZOLIN <=4 SENSITIVE Sensitive     CEFTRIAXONE <=1 SENSITIVE Sensitive     CIPROFLOXACIN >=4 RESISTANT Resistant     GENTAMICIN <=1 SENSITIVE Sensitive     IMIPENEM <=0.25 SENSITIVE Sensitive     NITROFURANTOIN <=16 SENSITIVE Sensitive     TRIMETH/SULFA <=20 SENSITIVE Sensitive     AMPICILLIN/SULBACTAM <=2 SENSITIVE Sensitive     PIP/TAZO <=4 SENSITIVE Sensitive     * >=100,000 COLONIES/mL ESCHERICHIA COLI  Blood culture (routine x 2)     Status: None   Collection Time: 04/25/15  5:00 PM  Result Value Ref Range Status   Specimen Description BLOOD RIGHT ANTECUBITAL  Final   Special Requests BOTTLES DRAWN AEROBIC AND ANAEROBIC 5 CC EA  Final   Culture  Setup Time   Final    GRAM POSITIVE COCCI IN CLUSTERS Gram Stain Report Called to,Read Back By and Verified With: Elveria Rising RN 17:30 04/26/15 ([email or web address]) IN BOTH AEROBIC AND ANAEROBIC BOTTLES    Culture   Final    METHICILLIN RESISTANT STAPHYLOCOCCUS AUREUS MICROAEROPHILIC STREPTOCOCCI Standardized susceptibility testing for this organism is not available. Performed at [facility]    Report Status 04/30/2015 FINAL  Final   Organism ID, Bacteria METHICILLIN RESISTANT STAPHYLOCOCCUS AUREUS  Final      Susceptibility  Methicillin resistant staphylococcus aureus - MIC*    CIPROFLOXACIN >=8 RESISTANT Resistant     ERYTHROMYCIN >=8 RESISTANT Resistant     GENTAMICIN <=0.5 SENSITIVE Sensitive     OXACILLIN >=4 RESISTANT Resistant     TETRACYCLINE <=1 SENSITIVE Sensitive     VANCOMYCIN <=0.5 SENSITIVE Sensitive     TRIMETH/SULFA >=320 RESISTANT Resistant     CLINDAMYCIN >=8 RESISTANT Resistant     RIFAMPIN <=0.5 SENSITIVE Sensitive     Inducible Clindamycin NEGATIVE Sensitive     * METHICILLIN RESISTANT STAPHYLOCOCCUS AUREUS  Blood culture (routine x 2)     Status:  None   Collection Time: 04/25/15  5:16 PM  Result Value Ref Range Status   Specimen Description BLOOD BLOOD RIGHT FOREARM  Final   Special Requests BOTTLES DRAWN AEROBIC AND ANAEROBIC 5 CC EA  Final   Culture  Setup Time   Final    GRAM POSITIVE COCCI IN CLUSTERS CRITICAL RESULT CALLED TO, READ BACK BY AND VERIFIED WITH: S GRAHAM 04/26/15 @ 82 M VESTAL IN BOTH AEROBIC AND ANAEROBIC BOTTLES    Culture   Final    METHICILLIN RESISTANT STAPHYLOCOCCUS AUREUS SUSCEPTIBILITIES PERFORMED ON PREVIOUS CULTURE WITHIN THE LAST 5 DAYS. MICROAEROPHILIC STREPTOCOCCI Standardized susceptibility testing for this organism is not available. Performed at [facility]    Report Status 05/03/2015 FINAL  Final  MRSA PCR Screening     Status: Abnormal   Collection Time: 04/25/15  6:55 PM  Result Value Ref Range Status   MRSA by PCR POSITIVE (A) NEGATIVE Final    Comment:        The GeneXpert MRSA Assay (FDA approved for NASAL specimens only), is one component of a comprehensive MRSA colonization surveillance program. It is not intended to diagnose MRSA infection nor to guide or monitor treatment for MRSA infections. RESULT CALLED TO, READ BACK BY AND VERIFIED WITH: Sharyn Lull REEVES,RN [id number] @ 2043 BY J SCOTTON   Culture, blood (routine x 2)     Status: None   Collection Time: 04/28/15  1:20 PM  Result Value Ref Range Status   Specimen Description BLOOD LEFT ARM  Final   Special Requests IN PEDIATRIC BOTTLE 1CC  Final   Culture  Setup Time   Final    GRAM POSITIVE COCCI IN CLUSTERS AEROBIC BOTTLE ONLY CRITICAL RESULT CALLED TO, READ BACK BY AND VERIFIED WITH: Hetty Blend RN 2222 04/30/15 A BROWNING    Culture   Final    MICROAEROPHILIC STREPTOCOCCI Standardized susceptibility testing for this organism is not available. Performed at [facility]    Report Status 05/03/2015 FINAL  Final  Culture, blood (routine x 2)     Status: None   Collection Time: 04/28/15  5:50 PM  Result  Value Ref Range Status   Specimen Description BLOOD PICC LINE  Final   Special Requests BOTTLES DRAWN AEROBIC AND ANAEROBIC 5CC EACH  Final   Culture   Final    NO GROWTH 5 DAYS Performed at [facility]    Report Status 05/03/2015 FINAL  Final  Culture, blood (routine x 2)     Status: None   Collection Time: 05/04/15  6:30 PM  Result Value Ref Range Status   Specimen Description BLOOD PICC LINE  Final   Special Requests BOTTLES DRAWN AEROBIC AND ANAEROBIC 5ML  Final   Culture   Final    NO GROWTH 5 DAYS Performed at [facility]    Report Status 05/09/2015 FINAL  Final  Culture, blood (routine x 2)     Status: None   Collection Time: 05/04/15  6:30 PM  Result Value Ref Range Status   Specimen Description BLOOD PICC LINE  Final   Special Requests BOTTLES DRAWN AEROBIC AND ANAEROBIC 5ML  Final   Culture   Final    NO GROWTH 5 DAYS Performed at [facility]    Report Status 05/09/2015 FINAL  Final  Anaerobic culture     Status: None   Collection Time: 05/05/15  1:01 PM  Result Value Ref Range Status   Specimen Description ABSCESS  Final   Special Requests SPEC A ON SWAB RIGHT PELVIC ABSCESS  Final   Gram Stain   Final    FEW WBC PRESENT, PREDOMINANTLY MONONUCLEAR NO SQUAMOUS EPITHELIAL CELLS SEEN FEW GRAM POSITIVE COCCI IN PAIRS IN CLUSTERS Performed at [facility]    Culture   Final    NO ANAEROBES ISOLATED Performed at [facility]    Report Status 05/10/2015 FINAL  Final  Culture, routine-abscess     Status: None   Collection Time: 05/05/15  1:01 PM  Result Value Ref Range Status   Specimen Description ABSCESS  Final   Special Requests SPEC A ON SWAB RIGHT PELVIC ABSCESS  Final   Gram Stain   Final    FEW WBC PRESENT, PREDOMINANTLY MONONUCLEAR NO SQUAMOUS EPITHELIAL CELLS SEEN FEW GRAM POSITIVE COCCI IN PAIRS IN CLUSTERS Performed at [facility]    Culture   Final    MODERATE METHICILLIN RESISTANT STAPHYLOCOCCUS  AUREUS Note: RIFAMPIN AND GENTAMICIN SHOULD NOT BE USED AS SINGLE DRUGS FOR TREATMENT OF STAPH INFECTIONS. CRITICAL RESULT CALLED TO, READ BACK BY AND VERIFIED WITH: IREKIA B. 8/1 @216  BY REAMM Performed at [facility]    Report Status 05/09/2015 FINAL  Final   Organism ID, Bacteria METHICILLIN RESISTANT STAPHYLOCOCCUS AUREUS  Final      Susceptibility   Methicillin resistant staphylococcus aureus - MIC*    CLINDAMYCIN >=8 RESISTANT Resistant     ERYTHROMYCIN >=8 RESISTANT Resistant     GENTAMICIN <=0.5 SENSITIVE Sensitive     LEVOFLOXACIN >=8 RESISTANT Resistant     OXACILLIN >=4 RESISTANT Resistant     RIFAMPIN <=0.5 SENSITIVE Sensitive     TRIMETH/SULFA >=320 RESISTANT Resistant     VANCOMYCIN 1 SENSITIVE Sensitive     TETRACYCLINE <=1 SENSITIVE Sensitive     * MODERATE METHICILLIN RESISTANT STAPHYLOCOCCUS AUREUS  Tissue culture     Status: None   Collection Time: 05/05/15  1:13 PM  Result Value Ref Range Status   Specimen Description TISSUE  Final   Special Requests SPEC B IN CUP BONE RIGHT ISCHIUM  Final   Gram Stain   Final    RARE WBC PRESENT, PREDOMINANTLY MONONUCLEAR NO ORGANISMS SEEN Performed at [facility]    Culture   Final    FEW METHICILLIN RESISTANT STAPHYLOCOCCUS AUREUS Note: RIFAMPIN AND GENTAMICIN SHOULD NOT BE USED AS SINGLE DRUGS FOR TREATMENT OF STAPH INFECTIONS. CRITICAL RESULT CALLED TO, READ BACK BY AND VERIFIED WITH: IREKIA B. 8/1 @216  BY REAMM Performed at [facility]    Report Status 05/09/2015 FINAL  Final   Organism ID, Bacteria METHICILLIN RESISTANT STAPHYLOCOCCUS AUREUS  Final      Susceptibility   Methicillin resistant staphylococcus aureus - MIC*    CLINDAMYCIN >=8 RESISTANT Resistant     ERYTHROMYCIN >=8 RESISTANT Resistant     GENTAMICIN <=0.5 SENSITIVE Sensitive     LEVOFLOXACIN >=  8 RESISTANT Resistant     OXACILLIN >=4 RESISTANT Resistant     RIFAMPIN <=0.5 SENSITIVE Sensitive     TRIMETH/SULFA >=320 RESISTANT  Resistant     VANCOMYCIN 1 SENSITIVE Sensitive     TETRACYCLINE <=1 SENSITIVE Sensitive     * FEW METHICILLIN RESISTANT STAPHYLOCOCCUS AUREUS  Culture, respiratory (NON-Expectorated)     Status: None   Collection Time: 05/08/15  8:36 AM  Result Value Ref Range Status   Specimen Description TRACHEAL ASPIRATE  Final   Special Requests Normal  Final   Gram Stain   Final    RARE WBC PRESENT, PREDOMINANTLY PMN NO SQUAMOUS EPITHELIAL CELLS SEEN FEW GRAM NEGATIVE RODS RARE GRAM POSITIVE COCCI IN PAIRS RARE YEAST    Culture   Final    ABUNDANT ACINETOBACTER CALCOACETICUS/BAUMANNII COMPLEX Note: TIGECYCLINE  MIC 1 COLISTIN SENSITIVE .0125 ug/mL  ETEST results for this drug are "FOR INVESTIGATIONAL USE ONLY" and should NOT be used for clinical purposes. Performed at [facility]    Report Status 05/12/2015 FINAL  Final   Organism ID, Bacteria ACINETOBACTER CALCOACETICUS/BAUMANNII COMPLEX  Final      Susceptibility   Acinetobacter calcoaceticus/baumannii complex - MIC*    AMPICILLIN >=32 RESISTANT Resistant     AMPICILLIN/SULBACTAM 4 SENSITIVE Sensitive     CEFAZOLIN >=64 RESISTANT Resistant     CEFTAZIDIME >=64 RESISTANT Resistant     CEFTRIAXONE >=64 RESISTANT Resistant     CIPROFLOXACIN >=4 RESISTANT Resistant     GENTAMICIN <=1 SENSITIVE Sensitive     IMIPENEM 0.5 SENSITIVE Sensitive     PIP/TAZO >=128 RESISTANT Resistant     TOBRAMYCIN <=1 SENSITIVE Sensitive     TRIMETH/SULFA >=320 RESISTANT Resistant     * ABUNDANT ACINETOBACTER CALCOACETICUS/BAUMANNII COMPLEX  Culture, blood (routine x 2)     Status: None   Collection Time: 05/10/15  3:05 PM  Result Value Ref Range Status   Specimen Description Blood  Final   Special Requests Normal  Final   Culture  Setup Time TEST WILL BE CREDITED TEST CANCELLED PER RN   Final   Culture TEST WILL BE CREDITED TEST CANCELLED PER RN   Final   Report Status 05/10/2015 FINAL  Final  Culture, respiratory (NON-Expectorated)      Status: None   Collection Time: 05/12/15 12:20 AM  Result Value Ref Range Status   Specimen Description BRONCHIAL ALVEOLAR LAVAGE  Final   Special Requests NONE  Final   Gram Stain   Final    MODERATE WBC PRESENT,BOTH PMN AND MONONUCLEAR RARE SQUAMOUS EPITHELIAL CELLS PRESENT NO ORGANISMS SEEN Performed at [facility]    Culture   Final    FEW CANDIDA ALBICANS FEW ACINETOBACTER CALCOACETICUS/BAUMANNII COMPLEX Performed at [facility]    Report Status 05/15/2015 FINAL  Final   Organism ID, Bacteria ACINETOBACTER CALCOACETICUS/BAUMANNII COMPLEX  Final      Susceptibility   Acinetobacter calcoaceticus/baumannii complex - MIC*    AMPICILLIN >=32 RESISTANT Resistant     AMPICILLIN/SULBACTAM 4 SENSITIVE Sensitive     CEFAZOLIN >=64 RESISTANT Resistant     CEFTAZIDIME >=64 RESISTANT Resistant     CEFTRIAXONE >=64 RESISTANT Resistant     CIPROFLOXACIN >=4 RESISTANT Resistant     GENTAMICIN <=1 SENSITIVE Sensitive     IMIPENEM 0.5 SENSITIVE Sensitive     PIP/TAZO >=128 RESISTANT Resistant     TOBRAMYCIN <=1 SENSITIVE Sensitive     TRIMETH/SULFA >=320 RESISTANT Resistant     * FEW ACINETOBACTER CALCOACETICUS/BAUMANNII COMPLEX  Assessment: 28yoM quad admitted 04/25/2015 with MRSA bacteremia from multiple sources, including osteo. Now with Acinetobacter in tracheal aspirate.  Attempted to get a gent trough today, however lab was delayed in drawing the level and the dose was given. SCr increased from 0.39>0.64 overnight. UOP dropped to  0.49mL/kg/hr. WBC 27.3, afebrile.  Goal of Therapy:  Vancomycin trough level 15-20 mcg/ml Gentamicin trough level <2 mcg/ml Eradication of infection.  Plan: - tigecycline  50mg  q12h - gentamicin 520 mg (~7mg /kg) q48h - will attempt to get another gent trough before dose due on 7/9 - vancomycin 750mg  IV q24h - follow up SCr, UOP, ID recommendations  Ciin Brazzel D. Bettylee Feig, PharmD, BCPS Clinical Pharmacist Pager: [phone] 05/15/2015  2:21 PM

## 2015-05-15 NOTE — Progress Notes (Signed)
PULMONARY / CRITICAL CARE MEDICINE   Name: Jason [name] MRN: [id number] DOB: 05-11-1986    ADMISSION DATE: 04/25/2015 TODAY'S DATE: 05/10/2015  REFERRING MD : [facility]  CHIEF COMPLAINT: Hypotension  INITIAL PRESENTATION: 29 year old male quad with MRSA bacteremia from multiple sources. Concern for endocarditis. Wounds have been checked and followed by general surgery and ortho. Patient has history of hypotension and concern for septic shock and levophed was started and PCCM consulted. 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 Now with possible HCAP.   SIGNIFICANT EVENTS: 7/28 >> OR for debridement of osteomyelitis and abscess cx with staph. Positive MRSA and strep blood cx 7/28 >> Reconsulted for extensive post op bleeding and hypotension 7/31 >> worsening respiratory status, acidosis, hypoxemia, hypotension overnight  STUDIES: 7/28 TEE >> NO vegetation, normal LV function  7/31 Bronch >> HCAP, extensive mucous plug 8/2 bronch - no sig obstruction 8/2- family does not want trach at this point even though is recommended 8/3 bronch lungs open, clear, extubated, re intubated lat evening due to collapse 8/4- pt changed decision, trach placed  SUBJECTIVE:  Refusing chest PT.  Denies SOB.  Tol very short periods PS 18/5.  S/p IVC filter 8/6   VITAL SIGNS: Temp:  [98.5 F (36.9 C)-98.7 F (37.1 C)] 98.6 F (37 C) (08/07 0834) Pulse Rate:  [79-107] 107 (08/07 0730) Resp:  [12-30] 20 (08/07 0730) BP: (79-138)/(44-82) 84/46 mmHg (08/07 0730) SpO2:  [92 %-100 %] 99 % (08/07 0730) FiO2 (%):  [40 %-100 %] 40 % (08/07 0717) Weight:  [158 lb 11.7 oz (72 kg)] 158 lb 11.7 oz (72 kg) (08/07 0500) HEMODYNAMICS:   VENTILATOR SETTINGS: Vent Mode:  [-] PRVC FiO2 (%):  [40 %-100 %] 40 % Set Rate:  [16 bmp] 16 bmp Vt Set:  [550 mL] 550 mL PEEP:  [5 cmH20] 5 cmH20 Plateau Pressure:  [22 cmH20-27 cmH20] 26 cmH20 INTAKE / OUTPUT:  Intake/Output  Summary (Last 24 hours) at 05/15/15 0939 Last data filed at 05/15/15 0700  Gross per 24 hour  Intake   1405 ml  Output   1160 ml  Net    245 ml    PHYSICAL EXAMINATION:  Gen: chronically ill appearing, NAD watching TV HENT: trach clean, dry PULM: resps even non labored on PS 18, coarse, reduced bases CV: RRR, no mgr, edema arms, feet GI: BS+, soft, nontender Derm:  sacral wound stage 4 plus Neuro:  rass 0,follows commands   LABS:  CBC  Recent Labs Lab 05/13/15 0513 05/14/15 2005 05/15/15 0443  WBC 26.8* 30.3* 27.3*  HGB 8.0* 9.0* 8.0*  HCT 22.9* 25.6* 23.8*  PLT 271 225 238   Coag's No results for input(s): APTT, INR in the last 168 hours. BMET  Recent Labs Lab 05/13/15 0513 05/14/15 0600 05/15/15 0443  NA 139 141 142  K 3.8 3.6 4.8  CL 111 111 117*  CO2 21* 19* 13*  BUN 13 14 16   CREATININE 0.46* 0.39* 0.64  GLUCOSE 124* 71 182*   Electrolytes  Recent Labs Lab 05/11/15 2100  05/13/15 0513 05/14/15 0600 05/15/15 0443  CALCIUM  --   < > 7.4* 7.3* 7.2*  MG 1.4*  < > 1.5* 1.6* 1.5*  PHOS 2.7  --  3.1 3.2  --   < > = values in this interval not displayed. Sepsis Markers  Recent Labs Lab 05/08/15 1040 05/10/15 1443  LATICACIDVEN 2.0 2.4*   ABG  Recent Labs Lab  05/11/15 2255 05/12/15 0135  PHART 7.347* 7.431  PCO2ART 32.4* 30.0*  PO2ART 137.0* 347.0*   Liver Enzymes  Recent Labs Lab 05/10/15 0536 05/11/15 0511  AST 29 23  ALT 12* 13*  ALKPHOS 105 95  BILITOT 0.3 0.3  ALBUMIN <1.0* <1.0*   Cardiac Enzymes No results for input(s): TROPONINI, PROBNP in the last 168 hours. Glucose  Recent Labs Lab 05/14/15 1409 05/14/15 1653 05/14/15 1937 05/15/15 05/15/15 0343 05/15/15 0835  GLUCAP 88 87 109* 154* 180* 207*    Imaging 8/3 CXR >RLL collapse    ASSESSMENT / PLAN:  PULMONARY A:  Acute hypoxemia respiratory failure > due to pulm edema vs HCAP with atelectasis  Pleural effusion R base, small Collapse rt base RML,  RLL< improved post bronch- reintubated Trach 8/5 >> P:  SBTs daily - Would NOT trach collar, needs pos pressure Chest PT Consider SLP for in-line PMV - will likely be prolonged wean if any   CARDIOVASCULAR Right Arm PICC 7/19>>> A:  Shock resolved Relative adrenal insufficiency P:  Observe off steroids Continue Midodrine 20mg  PO TID   RENAL A:  hypomag P:  Mag supp F/u chem   GASTROINTESTINAL OGT A:  Severe malnutrition P:  Zofran PRN nausea PPI 40 qd Cont TF For PEG  HEMATOLOGIC A:  Anemia - due to blood loss from sacral wound and severe peri op bleeding from debridement, dilution DVT prevention Persistent leukocytosis May 2016 PE P:  Trend CBC in am  SCD IVC filter placed 8/6   INFECTIOUS A:  MRSA bacteremia, neg TEE, likely osteomyelitis source E coli UTI  New HCAP likely Once finish abx course, need to remove all lines  MDR acinatobacter P:  BCx2 7/18 >>  MRSA BCx2 7/21 >>MRSA, strep Wound/tissue culture 7/28 >> Abscess culture 7/28 >> Bronch bal 7/31 >> MDR acint  BAL 8/4>>>few GNR>>>  Vancomycin 7/18>>> Aztreonam 7/18>>>8/3 8/1 cipro>>>8/3 8/3 gent >>> 8/3 tygacyclin>>>  Poor prognosis, consider tobi nebs?  ENDOCRINE A:  Possible adrenal insufficiency  T1 DM hypoglycemia P:  SSI Observe off steroids    NEUROLOGIC A: Quad post trauma Post op pain  Acute encephalopathy 7/31 > multifactorial, due to sepsis, anemia, hypotension, pain medications P:  RASS goal 0 ct fent patch & fent prn remeron qhs  Baclofen, lyrica    To SDU    Nickolas Madrid, NP 05/15/2015  9:40 AM Pager: [phone] or [phone]

## 2015-05-15 NOTE — Progress Notes (Signed)
[place] Progress Note Patient Name: JARREL KNOKE DOB: 1986/08/12 MRN: [id number]   Date of Service  05/15/2015  HPI/Events of Note  Magnesium = 1.5 and Creatinine = 0.64.  eICU Interventions  Will replete Magnesium.     Intervention Category Intermediate Interventions: Electrolyte abnormality - evaluation and management  Muriel Wilber Eugene 05/15/2015, 6:35 AM

## 2015-05-15 NOTE — Progress Notes (Signed)
Patient refusing CPT at this time.  °

## 2015-05-15 NOTE — Progress Notes (Signed)
Patient transported from room 2M14 to room 2C08 without any complications.

## 2015-05-16 DIAGNOSIS — J95851 Ventilator associated pneumonia: Secondary | ICD-10-CM

## 2015-05-16 DIAGNOSIS — B9689 Other specified bacterial agents as the cause of diseases classified elsewhere: Secondary | ICD-10-CM

## 2015-05-16 DIAGNOSIS — A408 Other streptococcal sepsis: Secondary | ICD-10-CM

## 2015-05-16 DIAGNOSIS — B954 Other streptococcus as the cause of diseases classified elsewhere: Secondary | ICD-10-CM

## 2015-05-16 DIAGNOSIS — L89154 Pressure ulcer of sacral region, stage 4: Secondary | ICD-10-CM

## 2015-05-16 LAB — GLUCOSE, CAPILLARY
GLUCOSE-CAPILLARY: 193 mg/dL — AB (ref 65–99)
GLUCOSE-CAPILLARY: 176 mg/dL — AB (ref 65–99)
GLUCOSE-CAPILLARY: 205 mg/dL — AB (ref 65–99)
Glucose-Capillary: 169 mg/dL — ABNORMAL HIGH (ref 65–99)
Glucose-Capillary: 173 mg/dL — ABNORMAL HIGH (ref 65–99)
Glucose-Capillary: 205 mg/dL — ABNORMAL HIGH (ref 65–99)

## 2015-05-16 LAB — BASIC METABOLIC PANEL
Anion gap: 7 (ref 5–15)
BUN: 16 mg/dL (ref 6–20)
CO2: 21 mmol/L — ABNORMAL LOW (ref 22–32)
CREATININE: 0.36 mg/dL — AB (ref 0.61–1.24)
Calcium: 7.2 mg/dL — ABNORMAL LOW (ref 8.9–10.3)
Chloride: 113 mmol/L — ABNORMAL HIGH (ref 101–111)
GFR calc Af Amer: >60 mL/min (ref >60)
GFR calc non Af Amer: >60 mL/min (ref >60)
Glucose, Bld: 212 mg/dL — ABNORMAL HIGH (ref 65–99)
Potassium: 3.2 mmol/L — ABNORMAL LOW (ref 3.5–5.1)
Sodium: 141 mmol/L (ref 135–145)

## 2015-05-16 LAB — CBC
HCT: 23.4 % — ABNORMAL LOW (ref 39.0–52.0)
HEMOGLOBIN: 7.9 g/dL — AB (ref 13.0–17.0)
MCH: 29.2 pg (ref 26.0–34.0)
MCHC: 33.8 g/dL (ref 30.0–36.0)
MCV: 86.3 fL (ref 78.0–100.0)
Platelets: 260 10*3/uL (ref 150–400)
RBC: 2.71 MIL/uL — AB (ref 4.22–5.81)
RDW: 16.4 % — ABNORMAL HIGH (ref 11.5–15.5)
WBC: 20.1 10*3/uL — AB (ref 4.0–10.5)

## 2015-05-16 LAB — MAGNESIUM: Magnesium: 1.5 mg/dL — ABNORMAL LOW (ref 1.7–2.4)

## 2015-05-16 MED ORDER — POTASSIUM CHLORIDE 20 MEQ/15ML (10%) PO SOLN
40.0000 meq | Freq: Once | ORAL | Status: AC
  Administered 2015-05-16: 40 meq
  Filled 2015-05-16: qty 30

## 2015-05-16 NOTE — Evaluation (Addendum)
[facility] Speaking Valve - Evaluation Patient Details  Name: Jason [name] MRN: [id number] Date of Birth: Dec 03, 1985  Today's Date: 05/16/2015 Time: 1216-1227 SLP Time Calculation (min) (ACUTE ONLY): 11 min  Past Medical History:  Past Medical History  Diagnosis Date  . GERD (gastroesophageal reflux disease)   . Asthma   . Hx MRSA infection     on face  . Gastroparesis   . Diabetic neuropathy   . Seizures   . Family history of anesthesia complication     Pt mother can't have epidural procedures  . Dysrhythmia   . Pneumonia   . Arthritis   . Fibromyalgia   . 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 Type I diabetes mellitus     sees Dr. Loanne Drilling   . Syncope 02/16/2015   Past Surgical History:  Past Surgical History  Procedure Laterality Date  . Tonsillectomy    . Multiple extractions with alveoloplasty N/A 08/03/2014    Procedure: MULTIPLE EXTRACTIONS;  Surgeon: Gae Bon, DDS;  Location: [place];  Service: Oral Surgery;  Laterality: N/A;  . Tee without cardioversion N/A 08/17/2014    Procedure: TRANSESOPHAGEAL ECHOCARDIOGRAM (TEE);  Surgeon: Dorothy Spark, MD;  Location: [facility] ENDOSCOPY;  Service: Cardiovascular;  Laterality: N/A;  . Debridment of decubitus ulcer N/A 10/04/2014    Procedure: DEBRIDMENT OF DECUBITUS ULCER;  Surgeon: Georganna Skeans, MD;  Location: [place];  Service: General;  Laterality: N/A;  . Laparoscopic diverted colostomy N/A 10/12/2014    Procedure: LAPAROSCOPIC DIVERTING COLOSTOMY;  Surgeon: Donnie Mesa, MD;  Location: [place];  Service: General;  Laterality: N/A;  . Insertion of suprapubic catheter N/A 10/12/2014    Procedure: INSERTION OF SUPRAPUBIC CATHETER;  Surgeon: Reece Packer, MD;  Location: [place];  Service: Urology;  Laterality: N/A;  . Incision and drainage of wound N/A 11/12/2014    Procedure: IRRIGATION AND DEBRIDEMENT OF WOUNDS WITH BONE BIOPSY AND SURGICAL PREP ;  Surgeon: Theodoro Kos, DO;  Location: [place];   Service: Plastics;  Laterality: N/A;  . Application of a-cell of back N/A 11/12/2014    Procedure: APPLICATION A CELL AND VAC ;  Surgeon: Theodoro Kos, DO;  Location: [place];  Service: Plastics;  Laterality: N/A;  . Incision and drainage of wound N/A 11/18/2014    Procedure: IRRIGATION AND DEBRIDEMENT OF SACRAL ULCER ONLY WITH PLACEMENT OF A CELL AND VAC/ DRESSING CHANGE TO UPPER BACK AREA.;  Surgeon: Theodoro Kos, DO;  Location: [place];  Service: Plastics;  Laterality: N/A;  . Incision and drainage of wound N/A 11/25/2014    Procedure: IRRIGATION AND DEBRIDEMENT OF SACRAL ULCER AND BACK BURN WITH PLACEMENT OF A-CELL;  Surgeon: Theodoro Kos, DO;  Location: [place];  Service: Plastics;  Laterality: N/A;  . Minor application of wound vac N/A 11/25/2014    Procedure:  WOUND VAC CHANGE;  Surgeon: Theodoro Kos, DO;  Location: [place];  Service: Plastics;  Laterality: N/A;  . Incision and drainage of wound Right 12/08/2014    Procedure: IRRIGATION AND DEBRIDEMENT SACRAL WOUND AND RIGHT ISCHIAL WOUND ;  Surgeon: Theodoro Kos, DO;  Location: [place];  Service: Plastics;  Laterality: Right;  . Application of a-cell of back N/A 12/08/2014    Procedure: APPLICATION OF A-CELL AND WOUND VAC ;  Surgeon: Theodoro Kos, DO;  Location: [place];  Service: Plastics;  Laterality: N/A;  . Tee without cardioversion N/A 05/05/2015    Procedure: TRANSESOPHAGEAL ECHOCARDIOGRAM (TEE);  Surgeon: Lelon Perla,  MD;  Location: [place];  Service: Cardiovascular;  Laterality: N/A;  . Incision and drainage of wound Bilateral 05/05/2015    Procedure: IRRIGATION AND DEBRIDEMENT SACRAL ULCER;  Surgeon: Theodoro Kos, DO;  Location: [place];  Service: Plastics;  Laterality: Bilateral;  . Hematoma evacuation N/A 05/05/2015    Procedure: EVACUATION HEMATOMA bedside procedure;  Surgeon: Theodoro Kos, DO;  Location: [place];  Service: Plastics;  Laterality: N/A;   HPI:  29 year old male quad with MRSA bacteremia from multiple sources. Concern  for endocarditis. Wounds have been checked and followed by general surgery and ortho. Patient has history of hypotension and concern for septic shock and levophed was started and PCCM consulted. 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 Now with possible HCAP. Trach placed 05/12/15.   Assessment / Plan / Recommendation Clinical Impression  Pt seen with RT to assess candidacy for in-line PMSV placement. Upon cuff deflation, pt had an audible leak but without sufficient upper airway patency as evidence by minimal change in expiratory tidal volume and peak pressure. It is possible that edema is contributing. Will continue to follow pt for readiness to trial in-line speaking valve.    SLP Assessment  Patient needs continued Speech Lanaguage Pathology Services    Follow Up Recommendations  Skilled Nursing facility;Home health SLP    Frequency and Duration min 2x/week  2 weeks   Pertinent Vitals/Pain [facility]    SLP Goals Potential to Achieve Goals (ACUTE ONLY): Good   PMSV Trial  PMSV was placed for: 0 minutes (not sufficient evidence of upper airway patency)   Tracheostomy Tube       Vent Dependency  Vent Mode: PRVC Set Rate: 16 bmp PEEP: 5 cmH20 FiO2 (%): 40 %    Cuff Deflation Trial Tolerated Cuff Deflation: Yes Length of Time for Cuff Deflation Trial: 5 min Behavior: Alert    Germain Osgood, M.A. CCC-SLP [phone]  Germain Osgood 05/16/2015, 3:44 PM

## 2015-05-16 NOTE — Progress Notes (Signed)
PT refused chest vest 

## 2015-05-16 NOTE — Progress Notes (Signed)
[facility] for Infectious Disease    Date of Admission:  04/25/2015   Total days of antibiotics 21        Day 21 Vancomycin        Day 6 Tigecycline        Day 6 Gentamycin  Active Problems:   Type 1 diabetes, uncontrolled, with neuropathy   Spinal cord infarction (history of)   Sepsis   Stage IV decubitus ulcer   Urinary tract infectious disease   HCAP (healthcare-associated pneumonia)   Hypotension   Abdominopelvic abscess   Hemorrhagic shock   Acute blood loss anemia   Edema   MRSA bacteremia   Collapse, lung   Pulmonary emboli   Infection due to acinetobacter baumannii   VAP (ventilator-associated pneumonia)   Acute respiratory failure with hypoxemia   Goals of care, counseling/discussion   DVT (deep venous thrombosis)   . albuterol  2.5 mg Nebulization Q4H  . antiseptic oral rinse  7 mL Mouth Rinse QID  . baclofen  10 mg Oral BID WC  . baclofen  20 mg Oral QHS  . chlorhexidine gluconate  15 mL Mouth Rinse BID  . fentaNYL  50 mcg Transdermal Q72H  . gentamicin  7 mg/kg Intravenous Q48H  . insulin aspart  0-15 Units Subcutaneous 6 times per day  . loratadine  10 mg Oral QHS  . midodrine  20 mg Oral TID WC  . mirtazapine  7.5 mg Oral QHS  . pantoprazole sodium  40 mg Per Tube Daily  . pregabalin  100 mg Oral TID AC  . pregabalin  200 mg Oral QHS  . sodium chloride  3 mL Intracatheter Q12H  . tigecycline (TYGACIL) IVPB  50 mg Intravenous Q12H  . vancomycin  750 mg Intravenous Q24H    Subjective: Patient states he is doing well today Review of Systems: Constitutional: negative, reports fatigue Ears, nose, mouth, throat, and face: negative Respiratory: Continues on Vent support, denies shortness of breath Cardiovascular: denies CP Gastrointestinal: Denies abd pain, N/V/D Genitourinary:positive for suprapubic cath, denies other complaints Integument/breast: States pain has improved over sacral ulcer. Does not like to  turn. Musculoskeletal:negative Neurological: positive for weakness and quadriplegia  Past Medical History  Diagnosis Date  . GERD (gastroesophageal reflux disease)   . Asthma   . Hx MRSA infection     on face  . Gastroparesis   . Diabetic neuropathy   . Seizures   . Family history of anesthesia complication     Pt mother can't have epidural procedures  . Dysrhythmia   . Pneumonia   . Arthritis   . Fibromyalgia   . 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 Type I diabetes mellitus     sees Dr. Loanne Drilling   . Syncope 02/16/2015    History  Substance Use Topics  . Smoking status: Former Smoker -- 1.00 packs/day for 12 years    Types: Cigars, Cigarettes    Quit date: 09/07/2014  . Smokeless tobacco: Never Used  . Alcohol Use: No    Family History  Problem Relation Age of Onset  . Diabetes Father   . Hypertension Father   . Asthma      fhx  . Hypertension      fhx  . Stroke      fhx  . Heart disease Mother    Allergies  Allergen Reactions  . Cefuroxime Axetil Anaphylaxis  . Ertapenem Other (See  Comments)    Rash and confusion  . Morphine And Related Other (See Comments)    Changed mental status, confusion, headache, visual hallucination  . Penicillins Anaphylaxis and Other (See Comments)    ?can take amoxicillin?  . Sulfa Antibiotics Anaphylaxis, Shortness Of Breath and Other (See Comments)  . Tessalon [Benzonatate] Anaphylaxis  . Shellfish Allergy Itching and Other (See Comments)    Took benadryl to alleviate reaction  . Miripirium Rash and Other (See Comments)    Change in mental status    OBJECTIVE: Blood pressure 95/53, pulse 105, temperature 99.4 F (37.4 C), temperature source Oral, resp. rate 18, height 5\' 8"  (1.727 m), weight 158 lb 15.2 oz (72.1 kg), SpO2 100 %. General: NAD, mother at bedside Skin: Sacral ulcer wound covered with dressing. Dressing C/D/I. Lungs: Course throughout. Trach'd and on vent.  Cor: +4 edema to right  had. +3 edema throughout.  Abdomen: Soft, non-tender. Bowel sounds hypoactive.  MSK: Able to flex at the wrist and elbow.  Neuro: 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with slight movement to upper extremities at wrist and elbow.   Lab Results Lab Results  Component Value Date   WBC 20.1* 05/16/2015   HGB 7.9* 05/16/2015   HCT 23.4* 05/16/2015   MCV 86.3 05/16/2015   PLT 260 05/16/2015    Lab Results  Component Value Date   CREATININE 0.36* 05/16/2015   BUN 16 05/16/2015   NA 141 05/16/2015   K 3.2* 05/16/2015   CL 113* 05/16/2015   CO2 21* 05/16/2015    Lab Results  Component Value Date   ALT 13* 05/11/2015   AST 23 05/11/2015   ALKPHOS 95 05/11/2015   BILITOT 0.3 05/11/2015     Microbiology: Recent Results (from the past 240 hour(s))  Culture, respiratory (NON-Expectorated)     Status: None   Collection Time: 05/08/15  8:36 AM  Result Value Ref Range Status   Specimen Description TRACHEAL ASPIRATE  Final   Special Requests Normal  Final   Gram Stain   Final    RARE WBC PRESENT, PREDOMINANTLY PMN NO SQUAMOUS EPITHELIAL CELLS SEEN FEW GRAM NEGATIVE RODS RARE GRAM POSITIVE COCCI IN PAIRS RARE YEAST    Culture   Final    ABUNDANT ACINETOBACTER CALCOACETICUS/BAUMANNII COMPLEX Note: TIGECYCLINE  MIC 1 COLISTIN SENSITIVE .0125 ug/mL  ETEST results for this drug are "FOR INVESTIGATIONAL USE ONLY" and should NOT be used for clinical purposes. Performed at [facility]    Report Status 05/12/2015 FINAL  Final   Organism ID, Bacteria ACINETOBACTER CALCOACETICUS/BAUMANNII COMPLEX  Final      Susceptibility   Acinetobacter calcoaceticus/baumannii complex - MIC*    AMPICILLIN >=32 RESISTANT Resistant     AMPICILLIN/SULBACTAM 4 SENSITIVE Sensitive     CEFAZOLIN >=64 RESISTANT Resistant     CEFTAZIDIME >=64 RESISTANT Resistant     CEFTRIAXONE >=64 RESISTANT Resistant     CIPROFLOXACIN >=4 RESISTANT Resistant     GENTAMICIN <=1 SENSITIVE Sensitive     IMIPENEM 0.5 SENSITIVE Sensitive      PIP/TAZO >=128 RESISTANT Resistant     TOBRAMYCIN <=1 SENSITIVE Sensitive     TRIMETH/SULFA >=320 RESISTANT Resistant     * ABUNDANT ACINETOBACTER CALCOACETICUS/BAUMANNII COMPLEX  Culture, blood (routine x 2)     Status: None   Collection Time: 05/10/15  3:05 PM  Result Value Ref Range Status   Specimen Description Blood  Final   Special Requests Normal  Final   Culture  Setup Time TEST WILL BE CREDITED TEST CANCELLED PER  RN   Final   Culture TEST WILL BE CREDITED TEST CANCELLED PER RN   Final   Report Status 05/10/2015 FINAL  Final  Culture, respiratory (NON-Expectorated)     Status: None   Collection Time: 05/12/15 12:20 AM  Result Value Ref Range Status   Specimen Description BRONCHIAL ALVEOLAR LAVAGE  Final   Special Requests NONE  Final   Gram Stain   Final    MODERATE WBC PRESENT,BOTH PMN AND MONONUCLEAR RARE SQUAMOUS EPITHELIAL CELLS PRESENT NO ORGANISMS SEEN Performed at [facility]    Culture   Final    FEW CANDIDA ALBICANS FEW ACINETOBACTER CALCOACETICUS/BAUMANNII COMPLEX Performed at [facility]    Report Status 05/15/2015 FINAL  Final   Organism ID, Bacteria ACINETOBACTER CALCOACETICUS/BAUMANNII COMPLEX  Final      Susceptibility   Acinetobacter calcoaceticus/baumannii complex - MIC*    AMPICILLIN >=32 RESISTANT Resistant     AMPICILLIN/SULBACTAM 4 SENSITIVE Sensitive     CEFAZOLIN >=64 RESISTANT Resistant     CEFTAZIDIME >=64 RESISTANT Resistant     CEFTRIAXONE >=64 RESISTANT Resistant     CIPROFLOXACIN >=4 RESISTANT Resistant     GENTAMICIN <=1 SENSITIVE Sensitive     IMIPENEM 0.5 SENSITIVE Sensitive     PIP/TAZO >=128 RESISTANT Resistant     TOBRAMYCIN <=1 SENSITIVE Sensitive     TRIMETH/SULFA >=320 RESISTANT Resistant     * FEW ACINETOBACTER CALCOACETICUS/BAUMANNII COMPLEX    Assessment: 1. HCAP: Respiratory cx's positive for Acinetobacter with sensitivity to Unasyn, Gentamycin, Imipenem, Tobramycin. He has multiple known  allergies to antibiotics including PCN, Sulfa and Ertapenem allergies. Trach placed on 8/5. BAL culture final with acinetobacter and few yeast cells. Continue: - Tigecycline with 100 mg LD, followed by 50mg  q12h - Gentamicin 520 mg (~7mg /kg) q24h - Monitor SCr, UOP, CBC  2. Micro-aerophilic streptococcal and MRSA bacteremia: Blood cultures drawn on 8/7 s/p PICC and A-line removal. - Blood cultures pending.   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 Wound care following.   Unfortunately, Mr. Marschall has a poor prognosis. He has been unable to wean from the vent, multiple infections with MDRO's and multiple abx allergies. He has been trach'd, continues to require the vent and will need placement in a SNF once he is more stable. He will need aggressive care to avoid any more skin breakdown and respiratory decline.     Tilden Fossa, NP student [facility] 05/16/2015, 10:45 AM  INFECTIOUS DISEASE ATTENDING ADDENDUM:     [facility] for Infectious Disease   Date: 05/16/2015  Patient name: THAD OSORIA  Medical record number: [id number]  Date of birth: [birth year]    This patient has been seen and discussed with the NP. Please see her note for complete details. I concur with their findings with the following additions/corrections:  I spent greater than 45 minutes with the patient including greater than 50% of time in face to face counsel of the patient and Mom re treatment plan with re to the VAP, bacteremias, deep ulcer and osteomyelitis and in coordination of their care.  #1 VAP with Acinetobacter in setting of multiple drug allergies: Pt still growing Acinetobacter from the BAL culture from the 4th ONE day after starting tygacil and gentamicin. HIs CXR still not terribly encouraging  Will continue with current regimen for now day 6/14  #2 Microaerophilic strep bacteremia and MRSA bacteremia: TEE  negative, PICC out Friday, repeat blood cultures NGTD  --he needs 6  weeks of therapy from 05/15/15 as day #1  #3 Osteomyelitis: 6 weeks of therapy from 05/15/15    Rhina Brackett [place] 05/16/2015, 4:22 PM

## 2015-05-16 NOTE — Progress Notes (Signed)
PULMONARY / CRITICAL CARE MEDICINE   Name: Jason [name] MRN: [id number] DOB: 08-07-1986    ADMISSION DATE: 04/25/2015 TODAY'S DATE: 05/10/2015  REFERRING MD : [facility]  CHIEF COMPLAINT: Hypotension  INITIAL PRESENTATION: 29 year old male quad with MRSA bacteremia from multiple sources. Concern for endocarditis. Wounds have been checked and followed by general surgery and ortho. Patient has history of hypotension and concern for septic shock and levophed was started and PCCM consulted. 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 Now with possible HCAP.   SIGNIFICANT EVENTS: 7/28 >> OR for debridement of osteomyelitis and abscess cx with staph. Positive MRSA and strep blood cx 7/28 >> Reconsulted for extensive post op bleeding and hypotension 7/31 >> worsening respiratory status, acidosis, hypoxemia, hypotension overnight  STUDIES: 7/28 TEE >> NO vegetation, normal LV function  7/31 Bronch >> HCAP, extensive mucous plug 8/2 bronch - no sig obstruction 8/2- family does not want trach at this point even though is recommended 8/3 bronch lungs open, clear, extubated, re intubated lat evening due to collapse 8/4- pt changed decision, trach placed  SUBJECTIVE/OVERNIGHT:  No acute change.  Not tol PS 18/5.  Anxious.  WBC trending down.  Afebrile.   VITAL SIGNS: Temp:  [98.3 F (36.8 C)-99.4 F (37.4 C)] 99.4 F (37.4 C) (08/08 0757) Pulse Rate:  [83-105] 105 (08/08 0757) Resp:  [14-31] 18 (08/08 0757) BP: (84-119)/(44-80) 95/53 mmHg (08/08 0757) SpO2:  [93 %-100 %] 100 % (08/08 0757) FiO2 (%):  [40 %] 40 % (08/08 0757) Weight:  [158 lb 15.2 oz (72.1 kg)] 158 lb 15.2 oz (72.1 kg) (08/08 0425) HEMODYNAMICS:   VENTILATOR SETTINGS: Vent Mode:  [-] PSV;CPAP FiO2 (%):  [40 %] 40 % Set Rate:  [16 bmp] 16 bmp Vt Set:  [550 mL] 550 mL PEEP:  [5 cmH20] 5 cmH20 Pressure Support:  [18 cmH20] 18 cmH20 Plateau Pressure:  [21 cmH20-25 cmH20] 25  cmH20 INTAKE / OUTPUT:  Intake/Output Summary (Last 24 hours) at 05/16/15 0933 Last data filed at 05/16/15 0600  Gross per 24 hour  Intake   1810 ml  Output   1300 ml  Net    510 ml    PHYSICAL EXAMINATION:  Gen: chronically ill appearing, anxious HENT: trach clean, dry PULM: tahcypneic on PS 18, coarse, reduced bases CV: RRR, no mgr, edema arms, feet GI: BS+, soft, nontender Derm:  sacral wound stage 4 plus Neuro:  rass 0,follows commands, appropriate    LABS:  CBC  Recent Labs Lab 05/14/15 2005 05/15/15 0443 05/16/15 0534  WBC 30.3* 27.3* 20.1*  HGB 9.0* 8.0* 7.9*  HCT 25.6* 23.8* 23.4*  PLT 225 238 260   Coag's No results for input(s): APTT, INR in the last 168 hours. BMET  Recent Labs Lab 05/14/15 0600 05/15/15 0443 05/16/15 0534  NA 141 142 141  K 3.6 4.8 3.2*  CL 111 117* 113*  CO2 19* 13* 21*  BUN 14 16 16   CREATININE 0.39* 0.64 0.36*  GLUCOSE 71 182* 212*   Electrolytes  Recent Labs Lab 05/11/15 2100  05/13/15 0513 05/14/15 0600 05/15/15 0443 05/16/15 0534  CALCIUM  --   < > 7.4* 7.3* 7.2* 7.2*  MG 1.4*  < > 1.5* 1.6* 1.5* 1.5*  PHOS 2.7  --  3.1 3.2  --   --   < > = values in this interval not displayed. Sepsis Markers  Recent Labs Lab 05/10/15 1443  LATICACIDVEN 2.4*   ABG  Recent Labs Lab 05/11/15 2255 05/12/15 0135  PHART 7.347* 7.431  PCO2ART 32.4* 30.0*  PO2ART 137.0* 347.0*   Liver Enzymes  Recent Labs Lab 05/10/15 0536 05/11/15 0511  AST 29 23  ALT 12* 13*  ALKPHOS 105 95  BILITOT 0.3 0.3  ALBUMIN <1.0* <1.0*   Cardiac Enzymes No results for input(s): TROPONINI, PROBNP in the last 168 hours. Glucose  Recent Labs Lab 05/15/15 1220 05/15/15 1624 05/15/15 2042 05/16/15 0028 05/16/15 0440 05/16/15 0755  GLUCAP 152* 144* 197* 205* 193* 176*    Imaging 8/3 CXR >RLL collapse    ASSESSMENT / PLAN:  PULMONARY A:  Acute hypoxemia respiratory failure > due to pulm edema vs HCAP with atelectasis   Pleural effusion R base, small Collapse rt base RML, RLL< improved post bronch- reintubated Trach 8/5 >> P:  SBTs daily - Would NOT trach collar, needs pos pressure Chest PT Consider SLP for in-line PMV - will likely be prolonged wean if any   CARDIOVASCULAR Right Arm PICC 7/19>>> A:  Shock resolved Relative adrenal insufficiency P:  Observe off steroids Continue Midodrine 20mg  PO TID   RENAL A:  hypomag Hypokalemia  P:  Mag supp F/u chem  Replete K PRN  GASTROINTESTINAL OGT A:  Severe malnutrition P:  Zofran PRN nausea PPI  Cont TF For PEG  HEMATOLOGIC A:  Anemia - due to blood loss from sacral wound and severe peri op bleeding from debridement, dilution DVT prevention Persistent leukocytosis May 2016 PE P:  Trend CBC in am  SCD IVC filter placed 8/6   INFECTIOUS A:  MRSA bacteremia, neg TEE, likely osteomyelitis source E coli UTI  New HCAP likely Once finish abx course, need to remove all lines  MDR acinatobacter P:  BCx2 7/18 >>  MRSA BCx2 7/21 >>MRSA, strep Wound/tissue culture 7/28 >> Abscess culture 7/28 >> Bronch bal 7/31 >> MDR acint  BAL 8/4>>>few GNR>>>  Vancomycin 7/18>>> Aztreonam 7/18>>>8/3 8/1 cipro>>>8/3 8/3 gent >>> 8/3 tygacyclin>>>  ID following   ENDOCRINE A:  Possible adrenal insufficiency  T1 DM hypoglycemia P:  SSI Observe off steroids    NEUROLOGIC A: Quad post trauma Post op pain  Acute encephalopathy 7/31 > multifactorial, due to sepsis, anemia, hypotension, pain medications P:  RASS goal 0 ct fent patch & fent prn remeron qhs  Baclofen, lyrica    Cont attempts at PS wean.  Plastic surgery to re-eval wound.  Will eventually need home with home vent v vent SNF if continues to be unable to wean. D/w case management.   Nickolas Madrid, NP 05/16/2015  9:33 AM Pager: [phone] or [phone]  Attending Note:  29 year old quad post trauma presenting with would  infection and septic shock originally.  Had to be placed back on the vent.  I placed him on wean this AM and to my surprise he tolerated it well.  Bibasilar crackles but tolerated.  I reviewed his CXR myself, evidence of hypoinflation.  Discussed with PCCM-NP.  Mother updated bedside and all questions answered.  Will continue abx as ordered.  Continue weaning as able, hopefully will be off the vent by the end of the week.  If continues to improve will not need a PEG tube.  That is still to be determined.  Hold in SDU for now.  Rush Farmer, M.D. [facility] Pulmonary/Critical Care Medicine. Pager: [phone]. After hours pager: [phone].

## 2015-05-16 NOTE — Progress Notes (Signed)
PT refused his CPT

## 2015-05-16 NOTE — Consult Note (Signed)
WOC consulted for air mattress, wound care evaluation, possible hydrotherapy.  This patient was followed and managed per plastic surgery.  I have contacted them this am to make them aware of current request for re-evaluation of his wounds.  I have addressed air mattress needs this am.   Plastics plans to see this patient this week for wound care re-evaluation.  Discussed mattress needs with bedside nurse and requested unit secretary to order mattress replacement. Re consult if needed, will not follow at this time. Thanks  Oumar Marcott [place], [place] [phone])

## 2015-05-17 LAB — BASIC METABOLIC PANEL
ANION GAP: 5 (ref 5–15)
BUN: 15 mg/dL (ref 6–20)
CO2: 22 mmol/L (ref 22–32)
CREATININE: 0.38 mg/dL — AB (ref 0.61–1.24)
Calcium: 7 mg/dL — ABNORMAL LOW (ref 8.9–10.3)
Chloride: 114 mmol/L — ABNORMAL HIGH (ref 101–111)
GFR calc non Af Amer: >60 mL/min (ref >60)
GLUCOSE: 148 mg/dL — AB (ref 65–99)
Potassium: 3.9 mmol/L (ref 3.5–5.1)
SODIUM: 141 mmol/L (ref 135–145)

## 2015-05-17 LAB — GENTAMICIN LEVEL, TROUGH: Gentamicin Trough: 2.6 ug/mL (ref 0.5–2.0)

## 2015-05-17 LAB — GLUCOSE, CAPILLARY
GLUCOSE-CAPILLARY: 118 mg/dL — AB (ref 65–99)
GLUCOSE-CAPILLARY: 216 mg/dL — AB (ref 65–99)
Glucose-Capillary: 134 mg/dL — ABNORMAL HIGH (ref 65–99)
Glucose-Capillary: 148 mg/dL — ABNORMAL HIGH (ref 65–99)
Glucose-Capillary: 164 mg/dL — ABNORMAL HIGH (ref 65–99)
Glucose-Capillary: 199 mg/dL — ABNORMAL HIGH (ref 65–99)

## 2015-05-17 LAB — MAGNESIUM: Magnesium: 1.3 mg/dL — ABNORMAL LOW (ref 1.7–2.4)

## 2015-05-17 MED ORDER — WHITE PETROLATUM GEL
Status: AC
  Administered 2015-05-17: 0.2
  Filled 2015-05-17: qty 1

## 2015-05-17 MED ORDER — VITAL AF 1.2 CAL PO LIQD
1000.0000 mL | ORAL | Status: DC
  Administered 2015-05-17 – 2015-05-31 (×20): 1000 mL
  Filled 2015-05-17 (×28): qty 1000

## 2015-05-17 MED ORDER — MAGNESIUM SULFATE 4 GM/100ML IV SOLN
4.0000 g | Freq: Once | INTRAVENOUS | Status: AC
  Administered 2015-05-17: 4 g via INTRAVENOUS
  Filled 2015-05-17: qty 100

## 2015-05-17 NOTE — Progress Notes (Addendum)
Nutrition Follow-up  DOCUMENTATION CODES:   Severe malnutrition in context of acute illness/injury, Underweight  INTERVENTION:    Increase Vital AF 1.2 formula to goal rate of 70 ml/hr to provide 2016 kcals, 126 gm protein, 1362 ml of free water  NUTRITION DIAGNOSIS:   Increased nutrient needs related to wound healing as evidenced by estimated needs; ongoing  GOAL:   Patient will meet greater than or equal to 90% of their needs; met  MONITOR:   Vent status, TF tolerance, Skin, I & O's, Labs, Weight trends  ASSESSMENT:   29 year old male who has a past medical history of GERD (gastroesophageal reflux disease); Asthma; MRSA infection; Gastroparesis; Diabetic neuropathy; Seizures; Family history of anesthesia complication; Dysrhythmia; Pneumonia; Arthritis; Fibromyalgia; Stroke, Type I diabetes mellitus; and Syncope.   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 Patient was sent to the ER from the physical therapy office today after patient was found to be hypotensive. [name] says that he has not been eating and drinking well for past twoweeks. In the ED patient was found to be dehydrated, had white count of 20,000, chest x-ray showing pneumonia as well as abnormal UA consistent with UTI.  Patient s/p procedure 7/28: IRRIGATION AND DEBRIDEMENT SACRAL ULCER - PLACEMENT OF ACELL AND VAC  Patient is currently on ventilator support -- trach MV: 7.0 L/min Temp (24hrs), Avg:99.6 F (37.6 C), Min:98.7 F (37.1 C), Max:101 F (38.3 C)   Vital AF 1.2 formula currently infusing at goal rate of 60 ml/hr via NGT (tip coiled in stomach) providing 1728 kcals, 108 gm protein, 1166 ml of free water.  Tolerating well.  Noted pt likely for PEG tube placement.   Diet Order:  Diet NPO time specified  Skin:  Wound (see comment) (Stage 4 sacral pressure ulcer, Stage 3 L heel pressure ulcer, Incision to back)  Last BM:  8/9  Height:   Ht Readings  from Last 1 Encounters:  04/25/15 _0  (1.727 m)    Weight:   Wt Readings from Last 1 Encounters:  05/17/15 160 lb (72.576 kg)    Ideal Body Weight:  70 kg (kg)  BMI:  Body mass index is 24.33 kg/(m^2).  Estimated Nutritional Needs:   Kcal:  2002  Protein:  110-120 gm  Fluid:  per MD  EDUCATION NEEDS:   No education needs identified at this time  Arthur Holms, RD, LDN Pager #: [phone] After-Hours Pager #: [phone]

## 2015-05-17 NOTE — Progress Notes (Signed)
ANTIBIOTIC CONSULT NOTE - FOLLOW UP  Pharmacy Consult for Gentamicin Indication: Acinetobacter pneumonia  Allergies  Allergen Reactions  . Cefuroxime Axetil Anaphylaxis  . Ertapenem Other (See Comments)    Rash and confusion  . Morphine And Related Other (See Comments)    Changed mental status, confusion, headache, visual hallucination  . Penicillins Anaphylaxis and Other (See Comments)    ?can take amoxicillin?  . Sulfa Antibiotics Anaphylaxis, Shortness Of Breath and Other (See Comments)  . Tessalon [Benzonatate] Anaphylaxis  . Shellfish Allergy Itching and Other (See Comments)    Took benadryl to alleviate reaction  . Miripirium Rash and Other (See Comments)    Change in mental status    Patient Measurements: Height: 5\' 8"  (172.7 cm) Weight: 160 lb (72.576 kg) IBW/kg (Calculated) : 68.4 Adjusted Body Weight:   Vital Signs: Temp: 99.4 F (37.4 C) (08/09 1221) Temp Source: Oral (08/09 1221) BP: 101/63 mmHg (08/09 1221) Pulse Rate: 120 (08/09 1221) Intake/Output from previous day: 08/08 0701 - 08/09 0700 In: 1880 [I.V.:240; NG/GT:1440; IV Piggyback:200] Out: 2200 [Urine:550; Stool:1650] Intake/Output from this shift: Total I/O In: 183 [I.V.:10; NG/GT:60; IV Piggyback:113] Out: 900 [Urine:175; Emesis/NG output:325; Stool:400]  Labs:  Recent Labs  05/14/15 2005 05/15/15 0443 05/16/15 0534 05/17/15 0450  WBC 30.3* 27.3* 20.1*  --   HGB 9.0* 8.0* 7.9*  --   PLT 225 238 260  --   CREATININE  --  0.64 0.36* 0.38*   Estimated Creatinine Clearance: 130.6 mL/min (by C-G formula based on Cr of 0.38).  Recent Labs  05/17/15 0928  GENTTROUGH 2.6*     Microbiology: Recent Results (from the past 720 hour(s))  Urine culture     Status: None   Collection Time: 04/25/15  4:35 PM  Result Value Ref Range Status   Specimen Description URINE, CLEAN CATCH  Final   Special Requests Normal  Final   Culture   Final    >=100,000 COLONIES/mL ESCHERICHIA COLI AZTREONAM  SENSITIVE ZONE 32MM Performed at [facility] Performed at [facility]    Report Status 05/02/2015 FINAL  Final   Organism ID, Bacteria ESCHERICHIA COLI  Final      Susceptibility   Escherichia coli - MIC*    AMPICILLIN <=2 SENSITIVE Sensitive     CEFAZOLIN <=4 SENSITIVE Sensitive     CEFTRIAXONE <=1 SENSITIVE Sensitive     CIPROFLOXACIN >=4 RESISTANT Resistant     GENTAMICIN <=1 SENSITIVE Sensitive     IMIPENEM <=0.25 SENSITIVE Sensitive     NITROFURANTOIN <=16 SENSITIVE Sensitive     TRIMETH/SULFA <=20 SENSITIVE Sensitive     AMPICILLIN/SULBACTAM <=2 SENSITIVE Sensitive     PIP/TAZO <=4 SENSITIVE Sensitive     * >=100,000 COLONIES/mL ESCHERICHIA COLI  Blood culture (routine x 2)     Status: None   Collection Time: 04/25/15  5:00 PM  Result Value Ref Range Status   Specimen Description BLOOD RIGHT ANTECUBITAL  Final   Special Requests BOTTLES DRAWN AEROBIC AND ANAEROBIC 5 CC EA  Final   Culture  Setup Time   Final    GRAM POSITIVE COCCI IN CLUSTERS Gram Stain Report Called to,Read Back By and Verified With: Elveria Rising RN 17:30 04/26/15 ([email or web address]) IN BOTH AEROBIC AND ANAEROBIC BOTTLES    Culture   Final    METHICILLIN RESISTANT STAPHYLOCOCCUS AUREUS MICROAEROPHILIC STREPTOCOCCI Standardized susceptibility testing for this organism is not available. Performed at [facility]    Report Status 04/30/2015 FINAL  Final   Organism ID,  Bacteria METHICILLIN RESISTANT STAPHYLOCOCCUS AUREUS  Final      Susceptibility   Methicillin resistant staphylococcus aureus - MIC*    CIPROFLOXACIN >=8 RESISTANT Resistant     ERYTHROMYCIN >=8 RESISTANT Resistant     GENTAMICIN <=0.5 SENSITIVE Sensitive     OXACILLIN >=4 RESISTANT Resistant     TETRACYCLINE <=1 SENSITIVE Sensitive     VANCOMYCIN <=0.5 SENSITIVE Sensitive     TRIMETH/SULFA >=320 RESISTANT Resistant     CLINDAMYCIN >=8 RESISTANT Resistant     RIFAMPIN <=0.5 SENSITIVE Sensitive     Inducible Clindamycin  NEGATIVE Sensitive     * METHICILLIN RESISTANT STAPHYLOCOCCUS AUREUS  Blood culture (routine x 2)     Status: None   Collection Time: 04/25/15  5:16 PM  Result Value Ref Range Status   Specimen Description BLOOD BLOOD RIGHT FOREARM  Final   Special Requests BOTTLES DRAWN AEROBIC AND ANAEROBIC 5 CC EA  Final   Culture  Setup Time   Final    GRAM POSITIVE COCCI IN CLUSTERS CRITICAL RESULT CALLED TO, READ BACK BY AND VERIFIED WITH: S GRAHAM 04/26/15 @ 12 M VESTAL IN BOTH AEROBIC AND ANAEROBIC BOTTLES    Culture   Final    METHICILLIN RESISTANT STAPHYLOCOCCUS AUREUS SUSCEPTIBILITIES PERFORMED ON PREVIOUS CULTURE WITHIN THE LAST 5 DAYS. MICROAEROPHILIC STREPTOCOCCI Standardized susceptibility testing for this organism is not available. Performed at [facility]    Report Status 05/03/2015 FINAL  Final  MRSA PCR Screening     Status: Abnormal   Collection Time: 04/25/15  6:55 PM  Result Value Ref Range Status   MRSA by PCR POSITIVE (A) NEGATIVE Final    Comment:        The GeneXpert MRSA Assay (FDA approved for NASAL specimens only), is one component of a comprehensive MRSA colonization surveillance program. It is not intended to diagnose MRSA infection nor to guide or monitor treatment for MRSA infections. RESULT CALLED TO, READ BACK BY AND VERIFIED WITH: Sharyn Lull REEVES,RN [id number] @ 2043 BY J SCOTTON   Culture, blood (routine x 2)     Status: None   Collection Time: 04/28/15  1:20 PM  Result Value Ref Range Status   Specimen Description BLOOD LEFT ARM  Final   Special Requests IN PEDIATRIC BOTTLE 1CC  Final   Culture  Setup Time   Final    GRAM POSITIVE COCCI IN CLUSTERS AEROBIC BOTTLE ONLY CRITICAL RESULT CALLED TO, READ BACK BY AND VERIFIED WITH: Hetty Blend RN 2222 04/30/15 A BROWNING    Culture   Final    MICROAEROPHILIC STREPTOCOCCI Standardized susceptibility testing for this organism is not available. Performed at [facility]    Report Status  05/03/2015 FINAL  Final  Culture, blood (routine x 2)     Status: None   Collection Time: 04/28/15  5:50 PM  Result Value Ref Range Status   Specimen Description BLOOD PICC LINE  Final   Special Requests BOTTLES DRAWN AEROBIC AND ANAEROBIC 5CC EACH  Final   Culture   Final    NO GROWTH 5 DAYS Performed at [facility]    Report Status 05/03/2015 FINAL  Final  Culture, blood (routine x 2)     Status: None   Collection Time: 05/04/15  6:30 PM  Result Value Ref Range Status   Specimen Description BLOOD PICC LINE  Final   Special Requests BOTTLES DRAWN AEROBIC AND ANAEROBIC 5ML  Final   Culture   Final    NO GROWTH 5  DAYS Performed at [facility]    Report Status 05/09/2015 FINAL  Final  Culture, blood (routine x 2)     Status: None   Collection Time: 05/04/15  6:30 PM  Result Value Ref Range Status   Specimen Description BLOOD PICC LINE  Final   Special Requests BOTTLES DRAWN AEROBIC AND ANAEROBIC 5ML  Final   Culture   Final    NO GROWTH 5 DAYS Performed at [facility]    Report Status 05/09/2015 FINAL  Final  Anaerobic culture     Status: None   Collection Time: 05/05/15  1:01 PM  Result Value Ref Range Status   Specimen Description ABSCESS  Final   Special Requests SPEC A ON SWAB RIGHT PELVIC ABSCESS  Final   Gram Stain   Final    FEW WBC PRESENT, PREDOMINANTLY MONONUCLEAR NO SQUAMOUS EPITHELIAL CELLS SEEN FEW GRAM POSITIVE COCCI IN PAIRS IN CLUSTERS Performed at [facility]    Culture   Final    NO ANAEROBES ISOLATED Performed at [facility]    Report Status 05/10/2015 FINAL  Final  Culture, routine-abscess     Status: None   Collection Time: 05/05/15  1:01 PM  Result Value Ref Range Status   Specimen Description ABSCESS  Final   Special Requests SPEC A ON SWAB RIGHT PELVIC ABSCESS  Final   Gram Stain   Final    FEW WBC PRESENT, PREDOMINANTLY MONONUCLEAR NO SQUAMOUS EPITHELIAL CELLS SEEN FEW GRAM POSITIVE COCCI IN  PAIRS IN CLUSTERS Performed at [facility]    Culture   Final    MODERATE METHICILLIN RESISTANT STAPHYLOCOCCUS AUREUS Note: RIFAMPIN AND GENTAMICIN SHOULD NOT BE USED AS SINGLE DRUGS FOR TREATMENT OF STAPH INFECTIONS. CRITICAL RESULT CALLED TO, READ BACK BY AND VERIFIED WITH: IREKIA B. 8/1 @216  BY REAMM Performed at [facility]    Report Status 05/09/2015 FINAL  Final   Organism ID, Bacteria METHICILLIN RESISTANT STAPHYLOCOCCUS AUREUS  Final      Susceptibility   Methicillin resistant staphylococcus aureus - MIC*    CLINDAMYCIN >=8 RESISTANT Resistant     ERYTHROMYCIN >=8 RESISTANT Resistant     GENTAMICIN <=0.5 SENSITIVE Sensitive     LEVOFLOXACIN >=8 RESISTANT Resistant     OXACILLIN >=4 RESISTANT Resistant     RIFAMPIN <=0.5 SENSITIVE Sensitive     TRIMETH/SULFA >=320 RESISTANT Resistant     VANCOMYCIN 1 SENSITIVE Sensitive     TETRACYCLINE <=1 SENSITIVE Sensitive     * MODERATE METHICILLIN RESISTANT STAPHYLOCOCCUS AUREUS  Tissue culture     Status: None   Collection Time: 05/05/15  1:13 PM  Result Value Ref Range Status   Specimen Description TISSUE  Final   Special Requests SPEC B IN CUP BONE RIGHT ISCHIUM  Final   Gram Stain   Final    RARE WBC PRESENT, PREDOMINANTLY MONONUCLEAR NO ORGANISMS SEEN Performed at [facility]    Culture   Final    FEW METHICILLIN RESISTANT STAPHYLOCOCCUS AUREUS Note: RIFAMPIN AND GENTAMICIN SHOULD NOT BE USED AS SINGLE DRUGS FOR TREATMENT OF STAPH INFECTIONS. CRITICAL RESULT CALLED TO, READ BACK BY AND VERIFIED WITH: IREKIA B. 8/1 @216  BY REAMM Performed at [facility]    Report Status 05/09/2015 FINAL  Final   Organism ID, Bacteria METHICILLIN RESISTANT STAPHYLOCOCCUS AUREUS  Final      Susceptibility   Methicillin resistant staphylococcus aureus - MIC*    CLINDAMYCIN >=8 RESISTANT Resistant     ERYTHROMYCIN >=  8 RESISTANT Resistant     GENTAMICIN <=0.5 SENSITIVE Sensitive     LEVOFLOXACIN >=8 RESISTANT  Resistant     OXACILLIN >=4 RESISTANT Resistant     RIFAMPIN <=0.5 SENSITIVE Sensitive     TRIMETH/SULFA >=320 RESISTANT Resistant     VANCOMYCIN 1 SENSITIVE Sensitive     TETRACYCLINE <=1 SENSITIVE Sensitive     * FEW METHICILLIN RESISTANT STAPHYLOCOCCUS AUREUS  Culture, respiratory (NON-Expectorated)     Status: None   Collection Time: 05/08/15  8:36 AM  Result Value Ref Range Status   Specimen Description TRACHEAL ASPIRATE  Final   Special Requests Normal  Final   Gram Stain   Final    RARE WBC PRESENT, PREDOMINANTLY PMN NO SQUAMOUS EPITHELIAL CELLS SEEN FEW GRAM NEGATIVE RODS RARE GRAM POSITIVE COCCI IN PAIRS RARE YEAST    Culture   Final    ABUNDANT ACINETOBACTER CALCOACETICUS/BAUMANNII COMPLEX Note: TIGECYCLINE  MIC 1 COLISTIN SENSITIVE .0125 ug/mL  ETEST results for this drug are "FOR INVESTIGATIONAL USE ONLY" and should NOT be used for clinical purposes. Performed at [facility]    Report Status 05/12/2015 FINAL  Final   Organism ID, Bacteria ACINETOBACTER CALCOACETICUS/BAUMANNII COMPLEX  Final      Susceptibility   Acinetobacter calcoaceticus/baumannii complex - MIC*    AMPICILLIN >=32 RESISTANT Resistant     AMPICILLIN/SULBACTAM 4 SENSITIVE Sensitive     CEFAZOLIN >=64 RESISTANT Resistant     CEFTAZIDIME >=64 RESISTANT Resistant     CEFTRIAXONE >=64 RESISTANT Resistant     CIPROFLOXACIN >=4 RESISTANT Resistant     GENTAMICIN <=1 SENSITIVE Sensitive     IMIPENEM 0.5 SENSITIVE Sensitive     PIP/TAZO >=128 RESISTANT Resistant     TOBRAMYCIN <=1 SENSITIVE Sensitive     TRIMETH/SULFA >=320 RESISTANT Resistant     * ABUNDANT ACINETOBACTER CALCOACETICUS/BAUMANNII COMPLEX  Culture, blood (routine x 2)     Status: None   Collection Time: 05/10/15  3:05 PM  Result Value Ref Range Status   Specimen Description Blood  Final   Special Requests Normal  Final   Culture  Setup Time TEST WILL BE CREDITED TEST CANCELLED PER RN   Final   Culture TEST WILL BE  CREDITED TEST CANCELLED PER RN   Final   Report Status 05/10/2015 FINAL  Final  Culture, respiratory (NON-Expectorated)     Status: None   Collection Time: 05/12/15 12:20 AM  Result Value Ref Range Status   Specimen Description BRONCHIAL ALVEOLAR LAVAGE  Final   Special Requests NONE  Final   Gram Stain   Final    MODERATE WBC PRESENT,BOTH PMN AND MONONUCLEAR RARE SQUAMOUS EPITHELIAL CELLS PRESENT NO ORGANISMS SEEN Performed at [facility]    Culture   Final    FEW CANDIDA ALBICANS FEW ACINETOBACTER CALCOACETICUS/BAUMANNII COMPLEX Performed at [facility]    Report Status 05/15/2015 FINAL  Final   Organism ID, Bacteria ACINETOBACTER CALCOACETICUS/BAUMANNII COMPLEX  Final      Susceptibility   Acinetobacter calcoaceticus/baumannii complex - MIC*    AMPICILLIN >=32 RESISTANT Resistant     AMPICILLIN/SULBACTAM 4 SENSITIVE Sensitive     CEFAZOLIN >=64 RESISTANT Resistant     CEFTAZIDIME >=64 RESISTANT Resistant     CEFTRIAXONE >=64 RESISTANT Resistant     CIPROFLOXACIN >=4 RESISTANT Resistant     GENTAMICIN <=1 SENSITIVE Sensitive     IMIPENEM 0.5 SENSITIVE Sensitive     PIP/TAZO >=128 RESISTANT Resistant     TOBRAMYCIN <=1 SENSITIVE Sensitive  TRIMETH/SULFA >=320 RESISTANT Resistant     * FEW ACINETOBACTER CALCOACETICUS/BAUMANNII COMPLEX  Culture, blood (routine x 2)     Status: None (Preliminary result)   Collection Time: 05/15/15  5:30 PM  Result Value Ref Range Status   Specimen Description BLOOD BLOOD LEFT HAND  Final   Special Requests BOTTLES DRAWN AEROBIC ONLY 1CC  Final   Culture NO GROWTH < 24 HOURS  Final   Report Status PENDING  Incomplete  Culture, blood (routine x 2)     Status: None (Preliminary result)   Collection Time: 05/15/15  6:22 PM  Result Value Ref Range Status   Specimen Description BLOOD RIGHT FOOT  Final   Special Requests IN PEDIATRIC BOTTLE 3CC  Final   Culture NO GROWTH < 24 HOURS  Final   Report Status PENDING   Incomplete    Anti-infectives    Start     Dose/Rate Route Frequency Ordered Stop   05/14/15 0029  vancomycin (VANCOCIN) IVPB 750 mg/150 ml premix     750 mg 150 mL/hr over 60 Minutes Intravenous Every 24 hours 05/14/15 0029     05/13/15 1000  gentamicin (GARAMYCIN) 520 mg in dextrose 5 % 100 mL IVPB     7 mg/kg  74.5 kg 113 mL/hr over 60 Minutes Intravenous Every 48 hours 05/12/15 0044     05/12/15 0000  tigecycline (TYGACIL) 50 mg in sodium chloride 0.9 % 100 mL IVPB     50 mg 200 mL/hr over 30 Minutes Intravenous Every 12 hours 05/11/15 0932     05/11/15 1200  tigecycline (TYGACIL) 100 mg in sodium chloride 0.9 % 100 mL IVPB     100 mg 200 mL/hr over 30 Minutes Intravenous  Once 05/11/15 0932 05/11/15 1443   05/11/15 1015  gentamicin (GARAMYCIN) 520 mg in dextrose 5 % 100 mL IVPB  Status:  Discontinued     7 mg/kg  74.5 kg 113 mL/hr over 60 Minutes Intravenous Every 24 hours 05/11/15 1000 05/12/15 0043   05/09/15 0930  ciprofloxacin (CIPRO) IVPB 400 mg  Status:  Discontinued     400 mg 200 mL/hr over 60 Minutes Intravenous Every 12 hours 05/09/15 0904 05/11/15 0823   05/08/15 0900  aztreonam (AZACTAM) 2 g in dextrose 5 % 50 mL IVPB  Status:  Discontinued     2 g 100 mL/hr over 30 Minutes Intravenous 3 times per day 05/08/15 0826 05/11/15 0823   05/06/15 0000  vancomycin (VANCOCIN) IVPB 750 mg/150 ml premix  Status:  Discontinued     750 mg 150 mL/hr over 60 Minutes Intravenous Every 48 hours 05/05/15 2357 05/14/15 0030   04/28/15 2000  vancomycin (VANCOCIN) IVPB 750 mg/150 ml premix  Status:  Discontinued     750 mg 150 mL/hr over 60 Minutes Intravenous Every 24 hours 04/28/15 0731 05/04/15 1932   04/26/15 0800  vancomycin (VANCOCIN) IVPB 750 mg/150 ml premix  Status:  Discontinued     750 mg 150 mL/hr over 60 Minutes Intravenous Every 12 hours 04/25/15 1715 04/28/15 0731   04/26/15 0200  aztreonam (AZACTAM) 1 g in dextrose 5 % 50 mL IVPB  Status:  Discontinued     1 g 100  mL/hr over 30 Minutes Intravenous Every 8 hours 04/25/15 1919 05/07/15 1035   04/25/15 1715  vancomycin (VANCOCIN) IVPB 750 mg/150 ml premix     750 mg 150 mL/hr over 60 Minutes Intravenous STAT 04/25/15 1715 04/25/15 1906   04/25/15 1645  aztreonam (AZACTAM) 2 g in  dextrose 5 % 50 mL IVPB     2 g 100 mL/hr over 30 Minutes Intravenous  Once 04/25/15 1643 04/25/15 1807      Assessment: 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  A Gent trough drawn today was 2.6, reflecting accumulation on the extended interval dosing protocol; today's dose was administered after the trough was collected.  His Cr did take a bump to 0.64 on 8/7, but has now corrected to 0.38.  UOP 0.25ml/kg/hr, which seems decreased relative to when Norva Karvonen was initiated on 8/3.  I have discussed this with the ID pharmacist & he is going to address with the ID team.  Goal of Therapy:  Gentamicin trough undetectable  Plan:  Hold Gentamicin Plan random Gent level in 72hr if decision is to continue Watch renal fxn F/U repeat cx Plan Vancomycin trough this evening  Gracy Bruins, PharmD Clinical Pharmacist [facility]    [place], [place] P 05/17/2015,1:54 PM

## 2015-05-17 NOTE — Progress Notes (Signed)
With critical result of Gentamycin 2.6,Pharmacy made aware.

## 2015-05-17 NOTE — Progress Notes (Signed)
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 albuterol  2.5 mg Nebulization Q4H  . antiseptic oral rinse  7 mL Mouth Rinse QID  . baclofen  10 mg Oral BID WC  . baclofen  20 mg Oral QHS  . chlorhexidine gluconate  15 mL Mouth Rinse BID  . fentaNYL  50 mcg Transdermal Q72H  . insulin aspart  0-15 Units Subcutaneous 6 times per day  . loratadine  10 mg Oral QHS  . magnesium sulfate 1 - 4 g bolus IVPB  4 g Intravenous Once  . midodrine  20 mg Oral TID WC  . mirtazapine  7.5 mg Oral QHS  . pantoprazole sodium  40 mg Per Tube Daily  . pregabalin  100 mg Oral TID AC  . pregabalin  200 mg Oral QHS  . sodium chloride  3 mL Intracatheter Q12H  . tigecycline (TYGACIL) IVPB  50 mg Intravenous Q12H  . vancomycin  750 mg Intravenous Q24H    Subjective: Patient states he feels OK this AM. Reports he lost a tooth when RN came in for oral care. Denies pain.  Review of Systems: Constitutional: negative, reports fatigue Ears, nose, mouth, throat, and face: Left  side of face edematous, sleeping on this side of his face. Lost tooth in middle lower jaw this AM with oral care by RN. Respiratory: Continues on Vent support, denies shortness of breath Cardiovascular: denies CP Gastrointestinal: Denies abd pain, N/V/D Genitourinary:positive for suprapubic cath, denies other complaints Integument/breast: States pain has improved over sacral ulcer. Does not like to turn. Musculoskeletal:negative Neurological: positive for weakness and quadriplegia  Past Medical History  Diagnosis Date  . GERD (gastroesophageal reflux disease)   . Asthma   . Hx MRSA infection     on face  . Gastroparesis   . Diabetic neuropathy   . Seizures   . Family history of anesthesia complication     Pt mother can't have epidural procedures  . Dysrhythmia   . Pneumonia   . Arthritis   . Fibromyalgia   . 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 Type I diabetes mellitus     sees Dr. Loanne Drilling   . Syncope 02/16/2015    History  Substance Use Topics  . Smoking status: Former Smoker -- 1.00 packs/day for 12 years    Types: Cigars, Cigarettes    Quit date: 09/07/2014  . Smokeless tobacco: Never Used  . Alcohol Use: No    Family History  Problem Relation Age of Onset  . Diabetes Father   . Hypertension Father   . Asthma      fhx  . Hypertension      fhx  . Stroke  fhx  . Heart disease Mother    Allergies  Allergen Reactions  . Cefuroxime Axetil Anaphylaxis  . Ertapenem Other (See Comments)    Rash and confusion  . Morphine And Related Other (See Comments)    Changed mental status, confusion, headache, visual hallucination  . Penicillins Anaphylaxis and Other (See Comments)    ?can take amoxicillin?  . Sulfa Antibiotics Anaphylaxis, Shortness Of Breath and Other (See Comments)  . Tessalon [Benzonatate] Anaphylaxis  . Shellfish Allergy Itching and Other (See Comments)    Took benadryl to alleviate reaction  . Miripirium Rash and Other  (See Comments)    Change in mental status    OBJECTIVE: Blood pressure 103/67, pulse 104, temperature 100.1 F (37.8 C), temperature source Oral, resp. rate 20, height 5\' 8"  (1.727 m), weight 160 lb (72.576 kg), SpO2 100 %. General: NAD, mother at bedside Skin: Sacral ulcer wound covered with dressing. Dressing C/D/I. Lungs: Course throughout. Trach'd and on vent.  Cor:+3 edema throughout.  Abdomen: Soft, non-tender. Bowel sounds hypoactive. Ostomy pink and moist.  MSK: Able to flex at the wrist and elbow.  Neuro: 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with slight movement to upper extremities at wrist and elbow.   Lab Results Lab Results  Component Value Date   WBC 20.1* 05/16/2015   HGB 7.9* 05/16/2015   HCT 23.4* 05/16/2015   MCV 86.3 05/16/2015   PLT 260 05/16/2015    Lab Results  Component Value Date   CREATININE 0.38* 05/17/2015   BUN 15 05/17/2015   NA 141 05/17/2015   K 3.9 05/17/2015   CL 114* 05/17/2015   CO2 22 05/17/2015    Lab Results  Component Value Date   ALT 13* 05/11/2015   AST 23 05/11/2015   ALKPHOS 95 05/11/2015   BILITOT 0.3 05/11/2015     Microbiology: Recent Results (from the past 240 hour(s))  Culture, respiratory (NON-Expectorated)     Status: None   Collection Time: 05/08/15  8:36 AM  Result Value Ref Range Status   Specimen Description TRACHEAL ASPIRATE  Final   Special Requests Normal  Final   Gram Stain   Final    RARE WBC PRESENT, PREDOMINANTLY PMN NO SQUAMOUS EPITHELIAL CELLS SEEN FEW GRAM NEGATIVE RODS RARE GRAM POSITIVE COCCI IN PAIRS RARE YEAST    Culture   Final    ABUNDANT ACINETOBACTER CALCOACETICUS/BAUMANNII COMPLEX Note: TIGECYCLINE  MIC 1 COLISTIN SENSITIVE .0125 ug/mL  ETEST results for this drug are "FOR INVESTIGATIONAL USE ONLY" and should NOT be used for clinical purposes. Performed at [facility]    Report Status 05/12/2015 FINAL  Final   Organism ID, Bacteria ACINETOBACTER CALCOACETICUS/BAUMANNII COMPLEX  Final       Susceptibility   Acinetobacter calcoaceticus/baumannii complex - MIC*    AMPICILLIN >=32 RESISTANT Resistant     AMPICILLIN/SULBACTAM 4 SENSITIVE Sensitive     CEFAZOLIN >=64 RESISTANT Resistant     CEFTAZIDIME >=64 RESISTANT Resistant     CEFTRIAXONE >=64 RESISTANT Resistant     CIPROFLOXACIN >=4 RESISTANT Resistant     GENTAMICIN <=1 SENSITIVE Sensitive     IMIPENEM 0.5 SENSITIVE Sensitive     PIP/TAZO >=128 RESISTANT Resistant     TOBRAMYCIN <=1 SENSITIVE Sensitive     TRIMETH/SULFA >=320 RESISTANT Resistant     * ABUNDANT ACINETOBACTER CALCOACETICUS/BAUMANNII COMPLEX  Culture, blood (routine x 2)     Status: None   Collection Time: 05/10/15  3:05 PM  Result Value Ref Range Status   Specimen Description Blood  Final  Special Requests Normal  Final   Culture  Setup Time TEST WILL BE CREDITED TEST CANCELLED PER RN   Final   Culture TEST WILL BE CREDITED TEST CANCELLED PER RN   Final   Report Status 05/10/2015 FINAL  Final  Culture, respiratory (NON-Expectorated)     Status: None   Collection Time: 05/12/15 12:20 AM  Result Value Ref Range Status   Specimen Description BRONCHIAL ALVEOLAR LAVAGE  Final   Special Requests NONE  Final   Gram Stain   Final    MODERATE WBC PRESENT,BOTH PMN AND MONONUCLEAR RARE SQUAMOUS EPITHELIAL CELLS PRESENT NO ORGANISMS SEEN Performed at [facility]    Culture   Final    FEW CANDIDA ALBICANS FEW ACINETOBACTER CALCOACETICUS/BAUMANNII COMPLEX Performed at [facility]    Report Status 05/15/2015 FINAL  Final   Organism ID, Bacteria ACINETOBACTER CALCOACETICUS/BAUMANNII COMPLEX  Final      Susceptibility   Acinetobacter calcoaceticus/baumannii complex - MIC*    AMPICILLIN >=32 RESISTANT Resistant     AMPICILLIN/SULBACTAM 4 SENSITIVE Sensitive     CEFAZOLIN >=64 RESISTANT Resistant     CEFTAZIDIME >=64 RESISTANT Resistant     CEFTRIAXONE >=64 RESISTANT Resistant     CIPROFLOXACIN >=4 RESISTANT Resistant     GENTAMICIN  <=1 SENSITIVE Sensitive     IMIPENEM 0.5 SENSITIVE Sensitive     PIP/TAZO >=128 RESISTANT Resistant     TOBRAMYCIN <=1 SENSITIVE Sensitive     TRIMETH/SULFA >=320 RESISTANT Resistant     * FEW ACINETOBACTER CALCOACETICUS/BAUMANNII COMPLEX  Culture, blood (routine x 2)     Status: None (Preliminary result)   Collection Time: 05/15/15  5:30 PM  Result Value Ref Range Status   Specimen Description BLOOD BLOOD LEFT HAND  Final   Special Requests BOTTLES DRAWN AEROBIC ONLY 1CC  Final   Culture NO GROWTH 2 DAYS  Final   Report Status PENDING  Incomplete  Culture, blood (routine x 2)     Status: None (Preliminary result)   Collection Time: 05/15/15  6:22 PM  Result Value Ref Range Status   Specimen Description BLOOD RIGHT FOOT  Final   Special Requests IN PEDIATRIC BOTTLE 3CC  Final   Culture NO GROWTH 2 DAYS  Final   Report Status PENDING  Incomplete    Assessment: 1. HCAP: Respiratory cx's positive for Acinetobacter with sensitivity to Unasyn, Gentamycin, Imipenem, Tobramycin. He has multiple known allergies to antibiotics including PCN, Sulfa and Ertapenem allergies. Trach placed on 8/5. BAL culture final with acinetobacter and few yeast cells. Continue: - Tigecycline with 100 mg LD, followed by 50mg  q12h - D/C Gentamicin, with random gent level in 72 hrs - Vanc trough this evening - Monitor SCr, UOP, CBC  2. Micro-aerophilic streptococcal and MRSA bacteremia: Blood cultures drawn on 8/7 s/p PICC and A-line removal. - Blood cultures pending. 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 Wound care following.   Unfortunately, Mr. Coopersmith has a poor prognosis. He has been unable to wean from the vent, multiple infections with MDRO's and multiple abx allergies. He has been trach'd, continues to require the vent and will need placement in a SNF once he is more stable. He will need aggressive care to avoid any more skin breakdown and respiratory decline.   Tilden Fossa, NP student [facility] 05/17/2015, 5:15 PM  INFECTIOUS DISEASE ATTENDING ADDENDUM:     [facility] Infect[facility]   Date: 05/17/2015  Patient name:  [facility] record number: [id number]  Date of birth: 05/30/1986    This patient has been seen and discussed with the house staff. Please see their note for complete details. I concur with their findings with the following additions/corrections:   #1 Acinetobacter pneumonia: Given elevated elevated gentamicin level will DC gentamicin and continue tigecycline for now. Patient has completed a seven-day course which is in keeping with updated guidelines. I will likely give him at least 10 days  #2 MRSA bacteremia + Microaerophilic streptococcal bacteremia  Osteomyelitis: cotninue vancomycin Thru Sept 19th.    [name] 05/17/2015, 5:35 PM

## 2015-05-17 NOTE — Progress Notes (Signed)
PULMONARY / CRITICAL CARE MEDICINE   Name: Jason [name] MRN: [id number] DOB: 04-05-1986    ADMISSION DATE: 04/25/2015 TODAY'S DATE: 05/10/2015  REFERRING MD : TRH  CHIEF COMPLAINT: Hypotension  INITIAL PRESENTATION: 29 year old male quad with MRSA bacteremia from multiple sources. Concern for endocarditis. Wounds have been checked and followed by general surgery and ortho. Patient has history of hypotension and concern for septic shock and levophed was started and PCCM consulted. 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 Now with possible HCAP.   SIGNIFICANT EVENTS: 7/28 >> OR for debridement of osteomyelitis and abscess cx with staph. Positive MRSA and strep blood cx 7/28 >> Reconsulted for extensive post op bleeding and hypotension 7/31 >> worsening respiratory status, acidosis, hypoxemia, hypotension overnight  STUDIES: 7/28 TEE >> NO vegetation, normal LV function  7/31 Bronch >> HCAP, extensive mucous plug 8/2 bronch - no sig obstruction 8/2- family does not want trach at this point even though is recommended 8/3 bronch lungs open, clear, extubated, re intubated lat evening due to collapse 8/4- pt changed decision, trach placed 8/8 weaned 10+ hours  SUBJECTIVE/OVERNIGHT:  No acute events, still bringing up lots of secretions, weaned 4 hours this morning  VITAL SIGNS: Temp:  [98.7 F (37.1 C)-101 F (38.3 C)] 99.4 F (37.4 C) (08/09 1221) Pulse Rate:  [94-120] 120 (08/09 1221) Resp:  [14-30] 18 (08/09 1221) BP: (89-120)/(48-87) 101/63 mmHg (08/09 1221) SpO2:  [95 %-100 %] 100 % (08/09 1221) FiO2 (%):  [40 %] 40 % (08/09 1221) Weight:  [72.576 kg (160 lb)] 72.576 kg (160 lb) (08/09 0436) HEMODYNAMICS:   VENTILATOR SETTINGS: Vent Mode:  [-] PRVC FiO2 (%):  [40 %] 40 % Set Rate:  [16 bmp] 16 bmp Vt Set:  [570 mL] 570 mL PEEP:  [5 cmH20] 5 cmH20 Pressure Support:  [10 cmH20] 10 cmH20 Plateau Pressure:  [23 cmH20-28  cmH20] 28 cmH20 INTAKE / OUTPUT:  Intake/Output Summary (Last 24 hours) at 05/17/15 1354 Last data filed at 05/17/15 1323  Gross per 24 hour  Intake   1863 ml  Output   2075 ml  Net   -212 ml    PHYSICAL EXAMINATION:  Gen: chronically ill appearing, sleepy on vent HENT: trach clean, dry PULM: vent supported breaths, loud rhonchi CV: RRR, no mgr, edema arms, feet GI: BS+, soft, nontender Derm:  sacral wound stage 4 plus Neuro:  Sleepy but arouses to touch    LABS:  CBC  Recent Labs Lab 05/14/15 2005 05/15/15 0443 05/16/15 0534  WBC 30.3* 27.3* 20.1*  HGB 9.0* 8.0* 7.9*  HCT 25.6* 23.8* 23.4*  PLT 225 238 260   Coag's No results for input(s): APTT, INR in the last 168 hours. BMET  Recent Labs Lab 05/15/15 0443 05/16/15 0534 05/17/15 0450  NA 142 141 141  K 4.8 3.2* 3.9  CL 117* 113* 114*  CO2 13* 21* 22  BUN 16 16 15   CREATININE 0.64 0.36* 0.38*  GLUCOSE 182* 212* 148*   Electrolytes  Recent Labs Lab 05/11/15 2100  05/13/15 0513 05/14/15 0600 05/15/15 0443 05/16/15 0534 05/17/15 0450  CALCIUM  --   < > 7.4* 7.3* 7.2* 7.2* 7.0*  MG 1.4*  < > 1.5* 1.6* 1.5* 1.5* 1.3*  PHOS 2.7  --  3.1 3.2  --   --   --   < > = values in this interval not displayed. Sepsis Markers  Recent Labs Lab 05/10/15 1443  LATICACIDVEN  2.4*   ABG  Recent Labs Lab 05/11/15 2255 05/12/15 0135  PHART 7.347* 7.431  PCO2ART 32.4* 30.0*  PO2ART 137.0* 347.0*   Liver Enzymes  Recent Labs Lab 05/11/15 0511  AST 23  ALT 13*  ALKPHOS 95  BILITOT 0.3  ALBUMIN <1.0*   Cardiac Enzymes No results for input(s): TROPONINI, PROBNP in the last 168 hours. Glucose  Recent Labs Lab 05/16/15 1636 05/16/15 2011 05/17/15 05/17/15 0426 05/17/15 0740 05/17/15 1249  GLUCAP 169* 173* 134* 148* 118* 216*    Imaging 8/3 CXR >RLL collapse    ASSESSMENT / PLAN:  PULMONARY A:  Acute hypoxemia respiratory failure > due to pulm edema vs HCAP with atelectasis   Pleural effusion R base, small Collapse rt base RML, RLL< improved post bronch- reintubated Trach 8/5 >> P:  SBTs daily - needs significant improvement in secretions prior to ATC Chest PT Consider SLP for in-line PMV - will likely be prolonged wean  CARDIOVASCULAR Right Arm PICC 7/19>>> A:  Shock resolved Relative adrenal insufficiency P:  Observe off steroids Continue Midodrine 20mg  PO TID   RENAL A:  Hypomag Hypokalemia > improving P:  Mag supp again Monitor BMET and UOP Replace electrolytes as needed   GASTROINTESTINAL OGT A:  Severe malnutrition P:  Zofran PRN nausea PPI  Cont TF Needs PEG  HEMATOLOGIC A:  Anemia - due to blood loss from sacral wound and severe peri op bleeding from debridement, dilution DVT prevention Persistent leukocytosis May 2016 PE IVC filter placed 8/6 P:  Trend CBC in am  SCD Monitor for bleeding   INFECTIOUS A:  MRSA bacteremia, neg TEE, likely osteomyelitis source E coli UTI  New HCAP likely Once finish abx course, need to remove all lines  MDR acinatobacter P:  BCx2 7/18 >>  MRSA BCx2 7/21 >>MRSA, strep Wound/tissue culture 7/28 >> Abscess culture 7/28 >> Bronch bal 7/31 >> MDR acint  BAL 8/4>>>few GNR>>>  Vancomycin 7/18>>> Aztreonam 7/18>>>8/3 8/1 cipro>>>8/3 8/3 gent >>> 8/3 tygacyclin>>>   ID following   ENDOCRINE A:  Possible adrenal insufficiency  T1 DM hypoglycemia P:  SSI Observe off steroids    NEUROLOGIC A: Quad post trauma Post op pain  Acute encephalopathy 7/31 > improved P:  RASS goal 0 Continue fent patch & fent prn remeron qhs  Baclofen, lyrica    Continue daily PSV weaning, needs PEG this week  Roselie Awkward, MD [facility] Pager: [phone] Cell: [phone] After 3pm or if no response, call [phone]

## 2015-05-18 DIAGNOSIS — R509 Fever, unspecified: Secondary | ICD-10-CM

## 2015-05-18 DIAGNOSIS — I2699 Other pulmonary embolism without acute cor pulmonale: Secondary | ICD-10-CM

## 2015-05-18 DIAGNOSIS — J9819 Other pulmonary collapse: Secondary | ICD-10-CM

## 2015-05-18 DIAGNOSIS — K088 Other specified disorders of teeth and supporting structures: Secondary | ICD-10-CM

## 2015-05-18 LAB — CBC WITH DIFFERENTIAL/PLATELET
BASOS ABS: 0 10*3/uL (ref 0.0–0.1)
BASOS PCT: 0 % (ref 0–1)
EOS ABS: 0.4 10*3/uL (ref 0.0–0.7)
Eosinophils Relative: 1 % (ref 0–5)
HCT: 21.3 % — ABNORMAL LOW (ref 39.0–52.0)
HEMOGLOBIN: 7 g/dL — AB (ref 13.0–17.0)
Lymphocytes Relative: 7 % — ABNORMAL LOW (ref 12–46)
Lymphs Abs: 2.5 10*3/uL (ref 0.7–4.0)
MCH: 29.4 pg (ref 26.0–34.0)
MCHC: 32.9 g/dL (ref 30.0–36.0)
MCV: 89.5 fL (ref 78.0–100.0)
MONO ABS: 1.8 10*3/uL — AB (ref 0.1–1.0)
Monocytes Relative: 5 % (ref 3–12)
NEUTROS PCT: 87 % — AB (ref 43–77)
Neutro Abs: 30.7 10*3/uL — ABNORMAL HIGH (ref 1.7–7.7)
PLATELETS: 206 10*3/uL (ref 150–400)
RBC: 2.38 MIL/uL — ABNORMAL LOW (ref 4.22–5.81)
RDW: 16.4 % — AB (ref 11.5–15.5)
WBC: 35.4 10*3/uL — ABNORMAL HIGH (ref 4.0–10.5)

## 2015-05-18 LAB — BASIC METABOLIC PANEL
Anion gap: 5 (ref 5–15)
BUN: 17 mg/dL (ref 6–20)
CALCIUM: 7.1 mg/dL — AB (ref 8.9–10.3)
CO2: 22 mmol/L (ref 22–32)
Chloride: 116 mmol/L — ABNORMAL HIGH (ref 101–111)
Creatinine, Ser: 0.4 mg/dL — ABNORMAL LOW (ref 0.61–1.24)
GFR calc Af Amer: >60 mL/min (ref >60)
GFR calc non Af Amer: >60 mL/min (ref >60)
GLUCOSE: 229 mg/dL — AB (ref 65–99)
POTASSIUM: 4.2 mmol/L (ref 3.5–5.1)
Sodium: 143 mmol/L (ref 135–145)

## 2015-05-18 LAB — MAGNESIUM: Magnesium: 1.8 mg/dL (ref 1.7–2.4)

## 2015-05-18 LAB — GLUCOSE, CAPILLARY
GLUCOSE-CAPILLARY: 214 mg/dL — AB (ref 65–99)
GLUCOSE-CAPILLARY: 217 mg/dL — AB (ref 65–99)
Glucose-Capillary: 214 mg/dL — ABNORMAL HIGH (ref 65–99)
Glucose-Capillary: 118 mg/dL — ABNORMAL HIGH (ref 65–99)
Glucose-Capillary: 87 mg/dL (ref 65–99)
Glucose-Capillary: 118 mg/dL — ABNORMAL HIGH (ref 65–99)

## 2015-05-18 LAB — VANCOMYCIN, TROUGH: Vancomycin Tr: 13 ug/mL (ref 10.0–20.0)

## 2015-05-18 MED ORDER — VANCOMYCIN HCL IN DEXTROSE 1-5 GM/200ML-% IV SOLN
1000.0000 mg | INTRAVENOUS | Status: DC
  Administered 2015-05-18 – 2015-05-20 (×3): 1000 mg via INTRAVENOUS
  Filled 2015-05-18 (×3): qty 200

## 2015-05-18 NOTE — Progress Notes (Signed)
Speech Language Pathology Treatment: Nada Boozer Speaking valve  Patient Details Name: Jason [name] MRN: [id number] DOB: 10-Mar-1986 Today's Date: 05/18/2015 Time: 4462-8638 SLP Time Calculation (min) (ACUTE ONLY): 30 min  Assessment / Plan / Recommendation Clinical Impression  Pt demonstrates increased upper airway patency today, as evidenced by 50% drop in expiratory tidal volume upon cuff deflation as well as good phonation and clearance of secretions orally with PMSV in place. RT present to assist with cuff deflation and manipulation of ventilator settings, including decrease in PEEP and increase in tidal volume to restore baseline peak pressure. Pt wore the valve for 22 minutes with Min cues for management of secretions with oral suction and Min-Mod cues for slower rate of speech to increase intelligibility. Will continue to follow for trials of inline PMSV.   HPI Other Pertinent Information: 29 year old male quad with MRSA bacteremia from multiple sources. Concern for endocarditis. Wounds have been checked and followed by general surgery and ortho. Patient has history of hypotension and concern for septic shock and levophed was started and PCCM consulted. 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 Now with possible HCAP. Trach placed 05/12/15.   Pertinent Vitals Pain Assessment: No/denies pain  SLP Plan  Continue with current plan of care    Recommendations     Patient may use Passy-Muir Speech Valve: with SLP only PMSV Supervision: Full       Follow up Recommendations: Skilled Nursing facility;Home health SLP Plan: Continue with current plan of care    Germain Osgood, M.A. CCC-SLP [phone]  Germain Osgood 05/18/2015, 1:18 PM

## 2015-05-18 NOTE — Progress Notes (Signed)
[facility]    Date of Admission:  04/25/2015   Total days of antibiotics 23 Day 23 Vancomycin Day 8 Tigecycline   Active Problems:   Type 1 diabetes, uncontrolled, with neuropathy   Spinal cord infarction (history of)   Sepsis   Stage IV decubitus ulcer   Urinary tract infectious disease   HCAP (healthcare-associated pneumonia)   Hypotension   Abdominopelvic abscess   Hemorrhagic shock   Acute blood loss anemia   Edema   MRSA bacteremia   Collapse, lung   Pulmonary emboli   Infection due to acinetobacter baumannii   VAP (ventilator-associated pneumonia)   Acute respiratory failure with hypoxemia   Goals of care, counseling/discussion   DVT (deep venous thrombosis)   . albuterol  2.5 mg Nebulization Q4H  . antiseptic oral rinse  7 mL Mouth Rinse QID  . baclofen  10 mg Oral BID WC  . baclofen  20 mg Oral QHS  . chlorhexidine gluconate  15 mL Mouth Rinse BID  . fentaNYL  50 mcg Transdermal Q72H  . insulin aspart  0-15 Units Subcutaneous 6 times per day  . loratadine  10 mg Oral QHS  . midodrine  20 mg Oral TID WC  . mirtazapine  7.5 mg Oral QHS  . pantoprazole sodium  40 mg Per Tube Daily  . pregabalin  100 mg Oral TID AC  . pregabalin  200 mg Oral QHS  . sodium chloride  3 mL Intracatheter Q12H  . tigecycline (TYGACIL) IVPB  50 mg Intravenous Q12H  . vancomycin  1,000 mg Intravenous Q24H    Subjective: Jason [name] is able to speak today as they are trialing an inline PMV. He is currently denying pain. States that nursing redressed his wound this AM and that surgery would come in to look at his wounds again per wound care rec's. He states that he slept well and does not have any  concerns. His mother is at the bedside and concerned about changes with the Gentamycin. I discussed that this was stopped r/t high Gent level in the blood.   Review of Systems: Constitutional: negative, reports fatigue Ears, nose, mouth, throat, and face: Face appears edematous. Lost tooth in middle lower jaw yesterday with oral care by RN. Respiratory: Continues on Vent support, denies shortness of breath Cardiovascular: denies CP Gastrointestinal: Denies abd pain, N/V/D Genitourinary:positive for suprapubic cath, denies other complaints Integument/breast: Denies pain over sacral ulcer. Does not like to turn. Musculoskeletal:negative Neurological: positive for weakness and quadriplegia  Past Medical History  Diagnosis Date  . GERD (gastroesophageal reflux disease)   . Asthma   . Hx MRSA infection     on face  . Gastroparesis   . Diabetic neuropathy   . Seizures   . Family history of anesthesia complication     Pt mother can't have epidural procedures  . Dysrhythmia   . Pneumonia   . Arthritis   . Fibromyalgia   . 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 Type I diabetes mellitus     sees Dr. Loanne Drilling   . Syncope 02/16/2015    Social History  Substance Use Topics  . Smoking status: Former Smoker -- 1.00 packs/day for 12 years    Types: Cigars, Cigarettes    Quit date: 09/07/2014  . Smokeless tobacco: Never Used  . Alcohol Use: No    Family History  Problem Relation Age of Onset  .  Diabetes Father   . Hypertension Father   . Asthma      fhx  . Hypertension      fhx  . Stroke      fhx  . Heart disease Mother    Allergies  Allergen Reactions  . Cefuroxime Axetil Anaphylaxis  . Ertapenem Other (See Comments)    Rash and confusion  . Morphine And Related Other (See Comments)    Changed mental status, confusion, headache, visual hallucination  . Penicillins Anaphylaxis and Other (See Comments)    ?can take amoxicillin?  . Sulfa Antibiotics  Anaphylaxis, Shortness Of Breath and Other (See Comments)  . Tessalon [Benzonatate] Anaphylaxis  . Shellfish Allergy Itching and Other (See Comments)    Took benadryl to alleviate reaction  . Miripirium Rash and Other (See Comments)    Change in mental status    OBJECTIVE: Blood pressure 102/59, pulse 96, temperature 97.8 F (36.6 C), temperature source Axillary, resp. rate 17, height 5\' 8"  (1.727 m), weight 158 lb (71.668 kg), SpO2 100 %. General: NAD, mother at bedside Skin: Sacral ulcer wound covered with dressing. Dressing C/D/I. Lungs: Course throughout. Trach'd, PMV trial and on vent.  Cor:+3 edema throughout.  Abdomen: Soft, non-tender. Bowel sounds hypoactive. Ostomy pink and moist.  MSK: Able to flex at the wrist and elbow.  Neuro: 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with slight movement to upper extremities at wrist and elbow.  Lab Results Lab Results  Component Value Date   WBC 35.4* 05/18/2015   HGB 7.0* 05/18/2015   HCT 21.3* 05/18/2015   MCV 89.5 05/18/2015   PLT 206 05/18/2015    Lab Results  Component Value Date   CREATININE 0.40* 05/18/2015   BUN 17 05/18/2015   NA 143 05/18/2015   K 4.2 05/18/2015   CL 116* 05/18/2015   CO2 22 05/18/2015    Lab Results  Component Value Date   ALT 13* 05/11/2015   AST 23 05/11/2015   ALKPHOS 95 05/11/2015   BILITOT 0.3 05/11/2015     Microbiology: Recent Results (from the past 240 hour(s))  Culture, blood (routine x 2)     Status: None   Collection Time: 05/10/15  3:05 PM  Result Value Ref Range Status   Specimen Description Blood  Final   Special Requests Normal  Final   Culture  Setup Time TEST WILL BE CREDITED TEST CANCELLED PER RN   Final   Culture TEST WILL BE CREDITED TEST CANCELLED PER RN   Final   Report Status 05/10/2015 FINAL  Final  Culture, respiratory (NON-Expectorated)     Status: None   Collection Time: 05/12/15 12:20 AM  Result Value Ref Range Status   Specimen Description BRONCHIAL ALVEOLAR LAVAGE  Final    Special Requests NONE  Final   Gram Stain   Final    MODERATE WBC PRESENT,BOTH PMN AND MONONUCLEAR RARE SQUAMOUS EPITHELIAL CELLS PRESENT NO ORGANISMS SEEN Performed at [facility]    Culture   Final    FEW CANDIDA ALBICANS FEW ACINETOBACTER CALCOACETICUS/BAUMANNII COMPLEX Performed at [facility]    Report Status 05/15/2015 FINAL  Final   Organism ID, Bacteria ACINETOBACTER CALCOACETICUS/BAUMANNII COMPLEX  Final      Susceptibility   Acinetobacter calcoaceticus/baumannii complex - MIC*    AMPICILLIN >=32 RESISTANT Resistant     AMPICILLIN/SULBACTAM 4 SENSITIVE Sensitive     CEFAZOLIN >=64 RESISTANT Resistant     CEFTAZIDIME >=64 RESISTANT Resistant     CEFTRIAXONE >=64 RESISTANT Resistant     CIPROFLOXACIN >=  4 RESISTANT Resistant     GENTAMICIN <=1 SENSITIVE Sensitive     IMIPENEM 0.5 SENSITIVE Sensitive     PIP/TAZO >=128 RESISTANT Resistant     TOBRAMYCIN <=1 SENSITIVE Sensitive     TRIMETH/SULFA >=320 RESISTANT Resistant     * FEW ACINETOBACTER CALCOACETICUS/BAUMANNII COMPLEX  Culture, blood (routine x 2)     Status: None (Preliminary result)   Collection Time: 05/15/15  5:30 PM  Result Value Ref Range Status   Specimen Description BLOOD BLOOD LEFT HAND  Final   Special Requests BOTTLES DRAWN AEROBIC ONLY 1CC  Final   Culture NO GROWTH 2 DAYS  Final   Report Status PENDING  Incomplete  Culture, blood (routine x 2)     Status: None (Preliminary result)   Collection Time: 05/15/15  6:22 PM  Result Value Ref Range Status   Specimen Description BLOOD RIGHT FOOT  Final   Special Requests IN PEDIATRIC BOTTLE 3CC  Final   Culture NO GROWTH 2 DAYS  Final   Report Status PENDING  Incomplete   Assessment/Plan:  1. HCAP: Respiratory cx's positive for Acinetobacter with sensitivity to Unasyn, Gentamycin, Imipenem, Tobramycin. He has multiple known allergies to antibiotics including PCN, Sulfa and Ertapenem allergies. Trach placed on 8/5. BAL culture final with  acinetobacter and few yeast cells. Unfortunately he was febrile through the night and he did have a spike in his white count.  - Tigecycline with 100 mg LD, followed by 50mg  q12h - Pending random gent level in 72 hrs - Vanc increased for low vanc trough - Monitor SCr, UOP, CBC - Recommend obtaining a tracheal aspirate due to increased white count, fever and increasing respiratory secretions.  2. Micro-aerophilic streptococcal and MRSA bacteremia: Blood cultures drawn on 8/7 s/p PICC and A-line removal. - Blood cultures pending. 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 Wound care following.   4. Increased ostomy output: Jason [name] has had increasing output of stool.  -Will monitor for now but if his fevers and leukocytosis persists, could consider C. Diff testing.   Unfortunately, Jason [name] has a poor prognosis. He has been unable to wean from the vent, multiple infections with MDRO's and multiple abx allergies. He has been trach'd, continues to require the vent and will need placement in a SNF once he is more stable. He will need aggressive care to avoid any more skin breakdown and respiratory decline.   Tilden Fossa, NP student [facility] 05/18/2015, 12:07 PM  INFECTIOUS DISEASE ATTENDING ADDENDUM:     [facility]   Date: 05/18/2015  Patient name: Jason [name]  Medical record number: [id number]  Date of birth: Sep 07, 1986    This patient has been seen and discussed with the house staff. Please see their note for complete details. I concur with their findings with the following additions/corrections:  I examined the patient in afternoon. He has copious purulent secretions, coarse breath sounds. Mom says more secretions after repositioning of pt.  Urine seems clean  Wounds examined see below   Back 05/18/15:    Sacral area 05/18/15:      Ischial ulcer  Right 05/18/15      Ischial ulcer Left 05/18/15:      Left heel ulcer 05/18/15:      Left foot 05/18/15:    #1 Fevers: I am worried we may be dealing with worsening of his VAP and Acinetobacter  --will ask for Resp therapy to  obtain tracheal aspirate for culture --plain films left foot, and consider MRI --if stool output continues to be issue and worsens, fever worsens send C diff according to new protocol  #2 MRSA bacteremia and Strep bacteremia: continue onwards with vancomycin  #3 Acinetobacter VAP: on tygacil and gent still in the system  I spent greater than 45 minutes with the patient including greater than 50% of time in face to face counsel of the patient and mom and in coordination of their care.    [name] 05/18/2015, 7:35 PM

## 2015-05-18 NOTE — Progress Notes (Signed)
Patient refused 12.29midnight CPT. RT will continue to monitor.

## 2015-05-18 NOTE — Progress Notes (Signed)
Patient ID: Jason [name], male   DOB: 03/26/1986, 29 y.o.   MRN: [id number]   IR aware of percutaneous g tube request.  Wbc 41 T: 101-100.4 last pm  Will have Rad review CT abd for anatomy approval  If appropriate We will plan for procedure when afebrile and wbc wnl

## 2015-05-18 NOTE — Progress Notes (Signed)
ANTIBIOTIC CONSULT NOTE  Pharmacy Consult for vancomycin Indication: MRSA bacteremia/osteo  Allergies  Allergen Reactions  . Cefuroxime Axetil Anaphylaxis  . Ertapenem Other (See Comments)    Rash and confusion  . Morphine And Related Other (See Comments)    Changed mental status, confusion, headache, visual hallucination  . Penicillins Anaphylaxis and Other (See Comments)    ?can take amoxicillin?  . Sulfa Antibiotics Anaphylaxis, Shortness Of Breath and Other (See Comments)  . Tessalon [Benzonatate] Anaphylaxis  . Shellfish Allergy Itching and Other (See Comments)    Took benadryl to alleviate reaction  . Miripirium Rash and Other (See Comments)    Change in mental status    Patient Measurements: Height: 5\' 8"  (172.7 cm) Weight: 160 lb (72.576 kg) IBW/kg (Calculated) : 68.4  Vital Signs: Temp: 98.2 F (36.8 C) (08/09 2337) Temp Source: Axillary (08/09 2337) BP: 106/72 mmHg (08/10 0000) Pulse Rate: 92 (08/10 0000) Intake/Output from previous day: 08/09 0701 - 08/10 0700 In: 943 [I.V.:90; NG/GT:540; IV Piggyback:313] Out: 2000 [Urine:1275; Emesis/NG output:325; Stool:400] Intake/Output from this shift: Total I/O In: -  Out: 800 [Urine:800]  Labs:  Recent Labs  05/15/15 0443 05/16/15 0534 05/17/15 0450  WBC 27.3* 20.1*  --   HGB 8.0* 7.9*  --   PLT 238 260  --   CREATININE 0.64 0.36* 0.38*   Estimated Creatinine Clearance: 130.6 mL/min (by C-G formula based on Cr of 0.38).  Recent Labs  05/17/15 0928 05/17/15 2320  VANCOTROUGH  --  13  GENTTROUGH 2.6*  --      Microbiology: Recent Results (from the past 720 hour(s))  Urine culture     Status: None   Collection Time: 04/25/15  4:35 PM  Result Value Ref Range Status   Specimen Description URINE, CLEAN CATCH  Final   Special Requests Normal  Final   Culture   Final    >=100,000 COLONIES/mL ESCHERICHIA COLI AZTREONAM SENSITIVE ZONE 32MM Performed at [facility] Performed at [facility]    Report Status 05/02/2015 FINAL  Final   Organism ID, Bacteria ESCHERICHIA COLI  Final      Susceptibility   Escherichia coli - MIC*    AMPICILLIN <=2 SENSITIVE Sensitive     CEFAZOLIN <=4 SENSITIVE Sensitive     CEFTRIAXONE <=1 SENSITIVE Sensitive     CIPROFLOXACIN >=4 RESISTANT Resistant     GENTAMICIN <=1 SENSITIVE Sensitive     IMIPENEM <=0.25 SENSITIVE Sensitive     NITROFURANTOIN <=16 SENSITIVE Sensitive     TRIMETH/SULFA <=20 SENSITIVE Sensitive     AMPICILLIN/SULBACTAM <=2 SENSITIVE Sensitive     PIP/TAZO <=4 SENSITIVE Sensitive     * >=100,000 COLONIES/mL ESCHERICHIA COLI  Blood culture (routine x 2)     Status: None   Collection Time: 04/25/15  5:00 PM  Result Value Ref Range Status   Specimen Description BLOOD RIGHT ANTECUBITAL  Final   Special Requests BOTTLES DRAWN AEROBIC AND ANAEROBIC 5 CC EA  Final   Culture  Setup Time   Final    GRAM POSITIVE COCCI IN CLUSTERS Gram Stain Report Called to,Read Back By and Verified With: Elveria Rising RN 17:30 04/26/15 ([email or web address]) IN BOTH AEROBIC AND ANAEROBIC BOTTLES    Culture   Final    METHICILLIN RESISTANT STAPHYLOCOCCUS AUREUS MICROAEROPHILIC STREPTOCOCCI Standardized susceptibility testing for this organism is not available. Performed at [facility]    Report Status 04/30/2015 FINAL  Final   Organism ID, Bacteria METHICILLIN RESISTANT STAPHYLOCOCCUS AUREUS  Final  Susceptibility   Methicillin resistant staphylococcus aureus - MIC*    CIPROFLOXACIN >=8 RESISTANT Resistant     ERYTHROMYCIN >=8 RESISTANT Resistant     GENTAMICIN <=0.5 SENSITIVE Sensitive     OXACILLIN >=4 RESISTANT Resistant     TETRACYCLINE <=1 SENSITIVE Sensitive     VANCOMYCIN <=0.5 SENSITIVE Sensitive     TRIMETH/SULFA >=320 RESISTANT Resistant     CLINDAMYCIN >=8 RESISTANT Resistant     RIFAMPIN <=0.5 SENSITIVE Sensitive     Inducible Clindamycin NEGATIVE Sensitive     * METHICILLIN RESISTANT STAPHYLOCOCCUS AUREUS  Blood culture  (routine x 2)     Status: None   Collection Time: 04/25/15  5:16 PM  Result Value Ref Range Status   Specimen Description BLOOD BLOOD RIGHT FOREARM  Final   Special Requests BOTTLES DRAWN AEROBIC AND ANAEROBIC 5 CC EA  Final   Culture  Setup Time   Final    GRAM POSITIVE COCCI IN CLUSTERS CRITICAL RESULT CALLED TO, READ BACK BY AND VERIFIED WITH: S GRAHAM 04/26/15 @ 83 M VESTAL IN BOTH AEROBIC AND ANAEROBIC BOTTLES    Culture   Final    METHICILLIN RESISTANT STAPHYLOCOCCUS AUREUS SUSCEPTIBILITIES PERFORMED ON PREVIOUS CULTURE WITHIN THE LAST 5 DAYS. MICROAEROPHILIC STREPTOCOCCI Standardized susceptibility testing for this organism is not available. Performed at [facility]    Report Status 05/03/2015 FINAL  Final  MRSA PCR Screening     Status: Abnormal   Collection Time: 04/25/15  6:55 PM  Result Value Ref Range Status   MRSA by PCR POSITIVE (A) NEGATIVE Final    Comment:        The GeneXpert MRSA Assay (FDA approved for NASAL specimens only), is one component of a comprehensive MRSA colonization surveillance program. It is not intended to diagnose MRSA infection nor to guide or monitor treatment for MRSA infections. RESULT CALLED TO, READ BACK BY AND VERIFIED WITH: Sharyn Lull REEVES,RN [id number] @ 2043 BY J SCOTTON   Culture, blood (routine x 2)     Status: None   Collection Time: 04/28/15  1:20 PM  Result Value Ref Range Status   Specimen Description BLOOD LEFT ARM  Final   Special Requests IN PEDIATRIC BOTTLE 1CC  Final   Culture  Setup Time   Final    GRAM POSITIVE COCCI IN CLUSTERS AEROBIC BOTTLE ONLY CRITICAL RESULT CALLED TO, READ BACK BY AND VERIFIED WITH: Hetty Blend RN 2222 04/30/15 A BROWNING    Culture   Final    MICROAEROPHILIC STREPTOCOCCI Standardized susceptibility testing for this organism is not available. Performed at [facility]    Report Status 05/03/2015 FINAL  Final  Culture, blood (routine x 2)     Status: None   Collection Time:  04/28/15  5:50 PM  Result Value Ref Range Status   Specimen Description BLOOD PICC LINE  Final   Special Requests BOTTLES DRAWN AEROBIC AND ANAEROBIC 5CC EACH  Final   Culture   Final    NO GROWTH 5 DAYS Performed at [facility]    Report Status 05/03/2015 FINAL  Final  Culture, blood (routine x 2)     Status: None   Collection Time: 05/04/15  6:30 PM  Result Value Ref Range Status   Specimen Description BLOOD PICC LINE  Final   Special Requests BOTTLES DRAWN AEROBIC AND ANAEROBIC 5ML  Final   Culture   Final    NO GROWTH 5 DAYS Performed at [facility]    Report Status 05/09/2015  FINAL  Final  Culture, blood (routine x 2)     Status: None   Collection Time: 05/04/15  6:30 PM  Result Value Ref Range Status   Specimen Description BLOOD PICC LINE  Final   Special Requests BOTTLES DRAWN AEROBIC AND ANAEROBIC 5ML  Final   Culture   Final    NO GROWTH 5 DAYS Performed at [facility]    Report Status 05/09/2015 FINAL  Final  Anaerobic culture     Status: None   Collection Time: 05/05/15  1:01 PM  Result Value Ref Range Status   Specimen Description ABSCESS  Final   Special Requests SPEC A ON SWAB RIGHT PELVIC ABSCESS  Final   Gram Stain   Final    FEW WBC PRESENT, PREDOMINANTLY MONONUCLEAR NO SQUAMOUS EPITHELIAL CELLS SEEN FEW GRAM POSITIVE COCCI IN PAIRS IN CLUSTERS Performed at [facility]    Culture   Final    NO ANAEROBES ISOLATED Performed at [facility]    Report Status 05/10/2015 FINAL  Final  Culture, routine-abscess     Status: None   Collection Time: 05/05/15  1:01 PM  Result Value Ref Range Status   Specimen Description ABSCESS  Final   Special Requests SPEC A ON SWAB RIGHT PELVIC ABSCESS  Final   Gram Stain   Final    FEW WBC PRESENT, PREDOMINANTLY MONONUCLEAR NO SQUAMOUS EPITHELIAL CELLS SEEN FEW GRAM POSITIVE COCCI IN PAIRS IN CLUSTERS Performed at [facility]    Culture   Final    MODERATE  METHICILLIN RESISTANT STAPHYLOCOCCUS AUREUS Note: RIFAMPIN AND GENTAMICIN SHOULD NOT BE USED AS SINGLE DRUGS FOR TREATMENT OF STAPH INFECTIONS. CRITICAL RESULT CALLED TO, READ BACK BY AND VERIFIED WITH: IREKIA B. 8/1 @216  BY REAMM Performed at [facility]    Report Status 05/09/2015 FINAL  Final   Organism ID, Bacteria METHICILLIN RESISTANT STAPHYLOCOCCUS AUREUS  Final      Susceptibility   Methicillin resistant staphylococcus aureus - MIC*    CLINDAMYCIN >=8 RESISTANT Resistant     ERYTHROMYCIN >=8 RESISTANT Resistant     GENTAMICIN <=0.5 SENSITIVE Sensitive     LEVOFLOXACIN >=8 RESISTANT Resistant     OXACILLIN >=4 RESISTANT Resistant     RIFAMPIN <=0.5 SENSITIVE Sensitive     TRIMETH/SULFA >=320 RESISTANT Resistant     VANCOMYCIN 1 SENSITIVE Sensitive     TETRACYCLINE <=1 SENSITIVE Sensitive     * MODERATE METHICILLIN RESISTANT STAPHYLOCOCCUS AUREUS  Tissue culture     Status: None   Collection Time: 05/05/15  1:13 PM  Result Value Ref Range Status   Specimen Description TISSUE  Final   Special Requests SPEC B IN CUP BONE RIGHT ISCHIUM  Final   Gram Stain   Final    RARE WBC PRESENT, PREDOMINANTLY MONONUCLEAR NO ORGANISMS SEEN Performed at [facility]    Culture   Final    FEW METHICILLIN RESISTANT STAPHYLOCOCCUS AUREUS Note: RIFAMPIN AND GENTAMICIN SHOULD NOT BE USED AS SINGLE DRUGS FOR TREATMENT OF STAPH INFECTIONS. CRITICAL RESULT CALLED TO, READ BACK BY AND VERIFIED WITH: IREKIA B. 8/1 @216  BY REAMM Performed at [facility]    Report Status 05/09/2015 FINAL  Final   Organism ID, Bacteria METHICILLIN RESISTANT STAPHYLOCOCCUS AUREUS  Final      Susceptibility   Methicillin resistant staphylococcus aureus - MIC*    CLINDAMYCIN >=8 RESISTANT Resistant     ERYTHROMYCIN >=8 RESISTANT Resistant     GENTAMICIN <=0.5 SENSITIVE Sensitive  LEVOFLOXACIN >=8 RESISTANT Resistant     OXACILLIN >=4 RESISTANT Resistant     RIFAMPIN <=0.5 SENSITIVE  Sensitive     TRIMETH/SULFA >=320 RESISTANT Resistant     VANCOMYCIN 1 SENSITIVE Sensitive     TETRACYCLINE <=1 SENSITIVE Sensitive     * FEW METHICILLIN RESISTANT STAPHYLOCOCCUS AUREUS  Culture, respiratory (NON-Expectorated)     Status: None   Collection Time: 05/08/15  8:36 AM  Result Value Ref Range Status   Specimen Description TRACHEAL ASPIRATE  Final   Special Requests Normal  Final   Gram Stain   Final    RARE WBC PRESENT, PREDOMINANTLY PMN NO SQUAMOUS EPITHELIAL CELLS SEEN FEW GRAM NEGATIVE RODS RARE GRAM POSITIVE COCCI IN PAIRS RARE YEAST    Culture   Final    ABUNDANT ACINETOBACTER CALCOACETICUS/BAUMANNII COMPLEX Note: TIGECYCLINE  MIC 1 COLISTIN SENSITIVE .0125 ug/mL  ETEST results for this drug are "FOR INVESTIGATIONAL USE ONLY" and should NOT be used for clinical purposes. Performed at [facility]    Report Status 05/12/2015 FINAL  Final   Organism ID, Bacteria ACINETOBACTER CALCOACETICUS/BAUMANNII COMPLEX  Final      Susceptibility   Acinetobacter calcoaceticus/baumannii complex - MIC*    AMPICILLIN >=32 RESISTANT Resistant     AMPICILLIN/SULBACTAM 4 SENSITIVE Sensitive     CEFAZOLIN >=64 RESISTANT Resistant     CEFTAZIDIME >=64 RESISTANT Resistant     CEFTRIAXONE >=64 RESISTANT Resistant     CIPROFLOXACIN >=4 RESISTANT Resistant     GENTAMICIN <=1 SENSITIVE Sensitive     IMIPENEM 0.5 SENSITIVE Sensitive     PIP/TAZO >=128 RESISTANT Resistant     TOBRAMYCIN <=1 SENSITIVE Sensitive     TRIMETH/SULFA >=320 RESISTANT Resistant     * ABUNDANT ACINETOBACTER CALCOACETICUS/BAUMANNII COMPLEX  Culture, blood (routine x 2)     Status: None   Collection Time: 05/10/15  3:05 PM  Result Value Ref Range Status   Specimen Description Blood  Final   Special Requests Normal  Final   Culture  Setup Time TEST WILL BE CREDITED TEST CANCELLED PER RN   Final   Culture TEST WILL BE CREDITED TEST CANCELLED PER RN   Final   Report Status 05/10/2015 FINAL  Final   Culture, respiratory (NON-Expectorated)     Status: None   Collection Time: 05/12/15 12:20 AM  Result Value Ref Range Status   Specimen Description BRONCHIAL ALVEOLAR LAVAGE  Final   Special Requests NONE  Final   Gram Stain   Final    MODERATE WBC PRESENT,BOTH PMN AND MONONUCLEAR RARE SQUAMOUS EPITHELIAL CELLS PRESENT NO ORGANISMS SEEN Performed at [facility]    Culture   Final    FEW CANDIDA ALBICANS FEW ACINETOBACTER CALCOACETICUS/BAUMANNII COMPLEX Performed at [facility]    Report Status 05/15/2015 FINAL  Final   Organism ID, Bacteria ACINETOBACTER CALCOACETICUS/BAUMANNII COMPLEX  Final      Susceptibility   Acinetobacter calcoaceticus/baumannii complex - MIC*    AMPICILLIN >=32 RESISTANT Resistant     AMPICILLIN/SULBACTAM 4 SENSITIVE Sensitive     CEFAZOLIN >=64 RESISTANT Resistant     CEFTAZIDIME >=64 RESISTANT Resistant     CEFTRIAXONE >=64 RESISTANT Resistant     CIPROFLOXACIN >=4 RESISTANT Resistant     GENTAMICIN <=1 SENSITIVE Sensitive     IMIPENEM 0.5 SENSITIVE Sensitive     PIP/TAZO >=128 RESISTANT Resistant     TOBRAMYCIN <=1 SENSITIVE Sensitive     TRIMETH/SULFA >=320 RESISTANT Resistant     * FEW ACINETOBACTER CALCOACETICUS/BAUMANNII COMPLEX  Culture, blood (routine x 2)     Status: None (Preliminary result)   Collection Time: 05/15/15  5:30 PM  Result Value Ref Range Status   Specimen Description BLOOD BLOOD LEFT HAND  Final   Special Requests BOTTLES DRAWN AEROBIC ONLY 1CC  Final   Culture NO GROWTH 2 DAYS  Final   Report Status PENDING  Incomplete  Culture, blood (routine x 2)     Status: None (Preliminary result)   Collection Time: 05/15/15  6:22 PM  Result Value Ref Range Status   Specimen Description BLOOD RIGHT FOOT  Final   Special Requests IN PEDIATRIC BOTTLE 3CC  Final   Culture NO GROWTH 2 DAYS  Final   Report Status PENDING  Incomplete   Assessment: 35yoM quad admitted 04/25/2015 with MRSA bacteremia from multiple  sources, including osteo. Pt continues on vancomycin - plan to continue until 9/19 per ID MD. Tm 101, afeb currently. WBC down to 20.1. UOP 0.7 ml/kg/hr. SCr down to 0.38 (not reflective of renal function in quad pt).  Vancomycin trough 13 mcg/ml (SUBtherapeutic) on 750mg  IV q24h.  Goal of Therapy:  Vancomycin trough 15-20 mcg/ml  Plan: - Change Vancomycin to 1000mg  IV q24h - Will f/u vancomycin trough at new Css, renal function, and pt's clinical condition  Sherlon Handing, PharmD, BCPS Clinical pharmacist, pager [phone] 05/18/2015 12:18 AM

## 2015-05-18 NOTE — Progress Notes (Signed)
PULMONARY / CRITICAL CARE MEDICINE   Name: Jason [name] MRN: [id number] DOB: [birth year]    ADMISSION DATE: 04/25/2015 TODAY'S DATE: 05/10/2015  REFERRING MD : [facility]  CHIEF COMPLAINT: Hypotension  INITIAL PRESENTATION: 29 year old male quad with MRSA bacteremia from multiple sources. Concern for endocarditis. Wounds have been checked and followed by general surgery and ortho. Patient has history of hypotension and concern for septic shock and levophed was started and PCCM consulted. 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 Now with possible HCAP.   SIGNIFICANT EVENTS: 7/28 >> OR for debridement of osteomyelitis and abscess cx with staph. Positive MRSA and strep blood cx 7/28 >> Reconsulted for extensive post op bleeding and hypotension 7/31 >> worsening respiratory status, acidosis, hypoxemia, hypotension overnight  STUDIES: 7/28 TEE >> NO vegetation, normal LV function  7/31 Bronch >> HCAP, extensive mucous plug 8/2 bronch - no sig obstruction 8/2- family does not want trach at this point even though is recommended 8/3 bronch lungs open, clear, extubated, re intubated lat evening due to collapse 8/4- pt changed decision, trach placed 8/8 weaned 10+ hours  SUBJECTIVE/OVERNIGHT:  No acute events, still bringing up lots of secretions, weaned 4 hours this morning  VITAL SIGNS: Temp:  [97.8 F (36.6 C)-100.4 F (38 C)] 97.8 F (36.6 C) (08/10 0820) Pulse Rate:  [92-120] 96 (08/10 1205) Resp:  [14-21] 17 (08/10 1205) BP: (68-122)/(54-97) 102/59 mmHg (08/10 1205) SpO2:  [100 %] 100 % (08/10 1205) FiO2 (%):  [40 %] 40 % (08/10 1203) Weight:  [71.668 kg (158 lb)] 71.668 kg (158 lb) (08/10 0500) HEMODYNAMICS:   VENTILATOR SETTINGS: Vent Mode:  [-] PRVC FiO2 (%):  [40 %] 40 % Set Rate:  [16 bmp] 16 bmp Vt Set:  [570 mL] 570 mL PEEP:  [5 cmH20] 5 cmH20 Plateau Pressure:  [23 cmH20-30 cmH20] 26 cmH20 INTAKE /  OUTPUT:  Intake/Output Summary (Last 24 hours) at 05/18/15 1215 Last data filed at 05/18/15 0700  Gross per 24 hour  Intake   1840 ml  Output   1825 ml  Net     15 ml    PHYSICAL EXAMINATION:  Gen: chronically ill appearing, sleepy on vent HENT: trach clean, dry PULM: vent supported breaths, loud rhonchi CV: RRR, no mgr, edema arms, feet GI: BS+, soft, nontender Derm:  sacral wound stage 4 plus Neuro:  Sleepy but arouses to touch    LABS:  CBC  Recent Labs Lab 05/15/15 0443 05/16/15 0534 05/18/15 0251  WBC 27.3* 20.1* 35.4*  HGB 8.0* 7.9* 7.0*  HCT 23.8* 23.4* 21.3*  PLT 238 260 206   Coag's No results for input(s): APTT, INR in the last 168 hours. BMET  Recent Labs Lab 05/16/15 0534 05/17/15 0450 05/18/15 0251  NA 141 141 143  K 3.2* 3.9 4.2  CL 113* 114* 116*  CO2 21* 22 22  BUN 16 15 17   CREATININE 0.36* 0.38* 0.40*  GLUCOSE 212* 148* 229*   Electrolytes  Recent Labs Lab 05/11/15 2100  05/13/15 0513 05/14/15 0600  05/16/15 0534 05/17/15 0450 05/18/15 0251  CALCIUM  --   < > 7.4* 7.3*  < > 7.2* 7.0* 7.1*  MG 1.4*  < > 1.5* 1.6*  < > 1.5* 1.3* 1.8  PHOS 2.7  --  3.1 3.2  --   --   --   --   < > = values in this interval not displayed. Sepsis Markers No results for input(s):  LATICACIDVEN, PROCALCITON, O2SATVEN in the last 168 hours. ABG  Recent Labs Lab 05/11/15 2255 05/12/15 0135  PHART 7.347* 7.431  PCO2ART 32.4* 30.0*  PO2ART 137.0* 347.0*   Liver Enzymes No results for input(s): AST, ALT, ALKPHOS, BILITOT, ALBUMIN in the last 168 hours. Cardiac Enzymes No results for input(s): TROPONINI, PROBNP in the last 168 hours. Glucose  Recent Labs Lab 05/17/15 1621 05/17/15 2223 05/17/15 2338 05/18/15 0651 05/18/15 0814 05/18/15 1200  GLUCAP 164* 199* 217* 214* 214* 118*    Imaging 8/3 CXR >RLL collapse    ASSESSMENT / PLAN:  PULMONARY A:  Acute hypoxemia respiratory failure > due to pulm edema vs HCAP with atelectasis   Pleural effusion R base, small Collapse rt base RML, RLL< improved post bronch- reintubated Trach 8/5 >> P:  PS as able but do not suspect liberation from vent. Chest PT. In-line PMV - will likely be prolonged wean.  CARDIOVASCULAR Right Arm PICC 7/19>>> A:  Shock resolved Relative adrenal insufficiency P:  Observe off steroids Continue Midodrine 20mg  PO TID  RENAL A:  Hypomag Hypokalemia > improving P:  Monitor BMET and UOP intermittently. Replace electrolytes as needed  GASTROINTESTINAL OGT A:  Severe malnutrition P:  Zofran PRN nausea PPI  Cont TF IR consulted for g-tube placement.  HEMATOLOGIC A:  Anemia - due to blood loss from sacral wound and severe peri op bleeding from debridement, dilution DVT prevention Persistent leukocytosis May 2016 PE IVC filter placed 8/6 P:  Trend CBC in am  SCD Monitor for bleeding   INFECTIOUS A:  MRSA bacteremia, neg TEE, likely osteomyelitis source E coli UTI  New HCAP likely Once finish abx course, need to remove all lines  MDR acinatobacter P:  BCx2 7/18 >>  MRSA BCx2 7/21 >>MRSA, strep Wound/tissue culture 7/28 >> Abscess culture 7/28 >> Bronch bal 7/31 >> MDR acint  BAL 8/4>>>few GNR>>>  Vancomycin 7/18>>> Aztreonam 7/18>>>8/3 8/1 cipro>>>8/3 8/3 gent >>> 8/3 tygacyclin>>>   ID following   ENDOCRINE A:  Possible adrenal insufficiency  T1 DM hypoglycemia P:  SSI Observe off steroids    NEUROLOGIC A: Quad post trauma Post op pain  Acute encephalopathy 7/31 > improved P:  RASS goal 0 Continue fent patch & fent prn remeron qhs  Baclofen, lyrica   Continue daily PSV weaning, G-tube to be inserted by IR, mother and patient ok with that, order in, arranging home with home vent.  Rush Farmer, M.D. [facility] Pulmonary/Critical Care Medicine. Pager: [phone]. After hours pager: [phone].

## 2015-05-18 NOTE — Progress Notes (Addendum)
Placed in-line pmv with speech therapy and patient tolerated well for 22 minutes.  Removed air in tracheostomy cuff, decreased peep to 0, increased Vt to 620.  Peak pressures remained at prior measurement of 31.  Patient had a cuff leak and was able to communicate with speech, MD, and his mother.  Placed patient back on original settings and he is tolerating well.

## 2015-05-19 ENCOUNTER — Encounter (HOSPITAL_COMMUNITY): Payer: Self-pay | Admitting: Radiology

## 2015-05-19 DIAGNOSIS — I82409 Acute embolism and thrombosis of unspecified deep veins of unspecified lower extremity: Secondary | ICD-10-CM

## 2015-05-19 DIAGNOSIS — E43 Unspecified severe protein-calorie malnutrition: Secondary | ICD-10-CM | POA: Insufficient documentation

## 2015-05-19 LAB — GLUCOSE, CAPILLARY
GLUCOSE-CAPILLARY: 173 mg/dL — AB (ref 65–99)
GLUCOSE-CAPILLARY: 260 mg/dL — AB (ref 65–99)
Glucose-Capillary: 245 mg/dL — ABNORMAL HIGH (ref 65–99)
Glucose-Capillary: 283 mg/dL — ABNORMAL HIGH (ref 65–99)
Glucose-Capillary: 232 mg/dL — ABNORMAL HIGH (ref 65–99)
Glucose-Capillary: 176 mg/dL — ABNORMAL HIGH (ref 65–99)

## 2015-05-19 LAB — MAGNESIUM: Magnesium: 1.7 mg/dL (ref 1.7–2.4)

## 2015-05-19 NOTE — Progress Notes (Signed)
Patient refused CPT for 2000 tonight.

## 2015-05-19 NOTE — Progress Notes (Addendum)
PULMONARY / CRITICAL CARE MEDICINE   Name: Jason [name] MRN: [id number] DOB: December 09, 1985    ADMISSION DATE: 04/25/2015 TODAY'S DATE: 05/10/2015  REFERRING MD : TRH  CHIEF COMPLAINT: Hypotension  INITIAL PRESENTATION: 29 year old male quad with MRSA bacteremia from multiple sources. Concern for endocarditis. Wounds have been checked and followed by general surgery and ortho. Patient has history of hypotension and concern for septic shock and levophed was started and PCCM consulted. 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 Now with possible HCAP.   SIGNIFICANT EVENTS: 7/28 >> OR for debridement of osteomyelitis and abscess cx with staph. Positive MRSA and strep blood cx 7/28 >> Reconsulted for extensive post op bleeding and hypotension 7/31 >> worsening respiratory status, acidosis, hypoxemia, hypotension overnight  STUDIES: 7/28 TEE >> NO vegetation, normal LV function  7/31 Bronch >> HCAP, extensive mucous plug 8/2 bronch - no sig obstruction 8/2- family does not want trach at this point even though is recommended 8/3 bronch lungs open, clear, extubated, re intubated lat evening due to collapse 8/4- pt changed decision, trach placed 8/8 weaned 10+ hours  SUBJECTIVE/OVERNIGHT:  No acute events, still bringing up lots of secretions, no new complaints.  VITAL SIGNS: Temp:  [97.8 F (36.6 C)-99.3 F (37.4 C)] 99 F (37.2 C) (08/11 0350) Pulse Rate:  [89-110] 107 (08/11 0350) Resp:  [13-26] 16 (08/11 0350) BP: (95-133)/(54-92) 113/86 mmHg (08/11 0350) SpO2:  [99 %-100 %] 100 % (08/11 0350) FiO2 (%):  [40 %] 40 % (08/11 0337) HEMODYNAMICS:   VENTILATOR SETTINGS: Vent Mode:  [-] PRVC FiO2 (%):  [40 %] 40 % Set Rate:  [16 bmp] 16 bmp Vt Set:  [570 mL] 570 mL PEEP:  [5 cmH20] 5 cmH20 Plateau Pressure:  [23 cmH20-28 cmH20] 26 cmH20 INTAKE / OUTPUT:  Intake/Output Summary (Last 24 hours) at 05/19/15 6644 Last data filed at  05/19/15 0600  Gross per 24 hour  Intake   1240 ml  Output   2400 ml  Net  -1160 ml    PHYSICAL EXAMINATION:  Gen: chronically ill appearing, sleepy on vent HENT: trach clean, dry PULM: vent supported breaths, loud rhonchi CV: RRR, no mgr, edema arms, feet GI: BS+, soft, nontender Derm:  sacral wound stage 4 plus Neuro:  Sleepy but arouses to touch    LABS:  CBC  Recent Labs Lab 05/15/15 0443 05/16/15 0534 05/18/15 0251  WBC 27.3* 20.1* 35.4*  HGB 8.0* 7.9* 7.0*  HCT 23.8* 23.4* 21.3*  PLT 238 260 206   Coag's No results for input(s): APTT, INR in the last 168 hours. BMET  Recent Labs Lab 05/16/15 0534 05/17/15 0450 05/18/15 0251  NA 141 141 143  K 3.2* 3.9 4.2  CL 113* 114* 116*  CO2 21* 22 22  BUN 16 15 17   CREATININE 0.36* 0.38* 0.40*  GLUCOSE 212* 148* 229*   Electrolytes  Recent Labs Lab 05/13/15 0513 05/14/15 0600  05/16/15 0534 05/17/15 0450 05/18/15 0251 05/19/15 0430  CALCIUM 7.4* 7.3*  < > 7.2* 7.0* 7.1*  --   MG 1.5* 1.6*  < > 1.5* 1.3* 1.8 1.7  PHOS 3.1 3.2  --   --   --   --   --   < > = values in this interval not displayed. Sepsis Markers No results for input(s): LATICACIDVEN, PROCALCITON, O2SATVEN in the last 168 hours. ABG No results for input(s): PHART, PCO2ART, PO2ART in the last 168 hours. Liver Enzymes No  results for input(s): AST, ALT, ALKPHOS, BILITOT, ALBUMIN in the last 168 hours. Cardiac Enzymes No results for input(s): TROPONINI, PROBNP in the last 168 hours. Glucose  Recent Labs Lab 05/18/15 0814 05/18/15 1200 05/18/15 1639 05/18/15 2007 05/19/15 0041 05/19/15 0348  GLUCAP 214* 118* 87 118* 173* 245*   Imaging I reviewed CXR myself, multi-lobar PNA noted.  Discussed with RT and bedside RN.  ASSESSMENT / PLAN:  PULMONARY A:  Acute hypoxemia respiratory failure > due to pulm edema vs HCAP with atelectasis  Pleural effusion R base, small Collapse rt base RML, RLL< improved post bronch-  reintubated Trach 8/5 >> P:  PS as able but do not suspect liberation from vent. Chest PT. In-line PMV - will likely be prolonged wean.  CARDIOVASCULAR Right Arm PICC 7/19>>> A:  Shock resolved Relative adrenal insufficiency P:  Observe off steroids Continue Midodrine 20mg  PO TID  RENAL A:  Hypomag Hypokalemia > improving P:  Monitor BMET and UOP intermittently. Replace electrolytes as needed  GASTROINTESTINAL OGT A:  Severe malnutrition P:  Zofran PRN nausea PPI  Cont TF IR consulted for g-tube placement.  HEMATOLOGIC A:  Anemia - due to blood loss from sacral wound and severe peri op bleeding from debridement, dilution DVT prevention Persistent leukocytosis May 2016 PE IVC filter placed 8/6 P:  Trend CBC in am  SCD Monitor for bleeding   INFECTIOUS A:  MRSA bacteremia, neg TEE, likely osteomyelitis source E coli UTI  New HCAP likely Once finish abx course, need to remove all lines  MDR acinatobacter P:  BCx2 7/18 >>  MRSA BCx2 7/21 >>MRSA, strep Wound/tissue culture 7/28 >> Abscess culture 7/28 >> Bronch bal 7/31 >> MDR acint  BAL 8/4>>>few GNR>>>  Vancomycin 7/18>>> Aztreonam 7/18>>>8/3 8/1 cipro>>>8/3 8/3 gent >>>8/7 8/3 tygacyclin>>>   ID following   ENDOCRINE A:  Possible adrenal insufficiency  T1 DM hypoglycemia P:  SSI Observe off steroids    NEUROLOGIC A: Quad post trauma Post op pain  Acute encephalopathy 7/31 > improved P:  RASS goal 0 Continue fent patch & fent prn remeron qhs  Baclofen, lyrica   Continue daily PSV weaning, G-tube to be inserted by IR (already saw the patient and CT is ordered), mother and patient ok with that, order in, arranging home with home vent.  Rush Farmer, M.D. [facility]Critical [facility] Medicine. Pager: [phone]. After hours pager: [phone].

## 2015-05-19 NOTE — Progress Notes (Signed)
Pt. seen for trach consult, all equipment at bedside, no education needed at this time. Will con't to follow.

## 2015-05-19 NOTE — Progress Notes (Signed)
Patient refused his CPT for 4am.

## 2015-05-19 NOTE — H&P (Signed)
Chief Complaint: Patient was seen in consultation today for malnutrition  Chief Complaint  Patient presents with  . Hypotension   at the request of CCM  Referring Physician(s): Dr. Nelda Marseille  History of Present Illness: Jason [name] is a 29 y.o. male who presented 7/18 with hypotension and found to have pneumonia, UTI and MRSA Bacteremia with source felt to be secondary to sacral decub ulcer s/p debridement. TEE negative for vegetations and ID is following with IV antibiotics. He has respiratory failure and is on a Vent s/p tracheostomy. IR received request for percutaneous gastrostomy tube for malnutrition. History is obtained per chart review and patient's mother. The patient does have a ostomy and suprapubic catheter in place. Patient with persistent elevated wbc still, will wait until trending down towards normal limits and remains afebrile for G-tube.   Past Medical History  Diagnosis Date  . GERD (gastroesophageal reflux disease)   . Asthma   . Hx MRSA infection     on face  . Gastroparesis   . Diabetic neuropathy   . Seizures   . Family history of anesthesia complication     Pt mother can't have epidural procedures  . Dysrhythmia   . Pneumonia   . Arthritis   . Fibromyalgia   . 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 Type I diabetes mellitus     sees Dr. Loanne Drilling   . Syncope 02/16/2015    Past Surgical History  Procedure Laterality Date  . Tonsillectomy    . Multiple extractions with alveoloplasty N/A 08/03/2014    Procedure: MULTIPLE EXTRACTIONS;  Surgeon: Gae Bon, DDS;  Location: [place];  Service: Oral Surgery;  Laterality: N/A;  . Tee without cardioversion N/A 08/17/2014    Procedure: TRANSESOPHAGEAL ECHOCARDIOGRAM (TEE);  Surgeon: Dorothy Spark, MD;  Location: [facility] ENDOSCOPY;  Service: Cardiovascular;  Laterality: N/A;  . Debridment of decubitus ulcer N/A 10/04/2014    Procedure: DEBRIDMENT OF DECUBITUS ULCER;  Surgeon: Georganna Skeans, MD;  Location: [place];  Service: General;  Laterality: N/A;  . Laparoscopic diverted colostomy N/A 10/12/2014    Procedure: LAPAROSCOPIC DIVERTING COLOSTOMY;  Surgeon: Donnie Mesa, MD;  Location: [place];  Service: General;  Laterality: N/A;  . Insertion of suprapubic catheter N/A 10/12/2014    Procedure: INSERTION OF SUPRAPUBIC CATHETER;  Surgeon: Reece Packer, MD;  Location: [place];  Service: Urology;  Laterality: N/A;  . Incision and drainage of wound N/A 11/12/2014    Procedure: IRRIGATION AND DEBRIDEMENT OF WOUNDS WITH BONE BIOPSY AND SURGICAL PREP ;  Surgeon: Theodoro Kos, DO;  Location: [place];  Service: Plastics;  Laterality: N/A;  . Application of a-cell of back N/A 11/12/2014    Procedure: APPLICATION A CELL AND VAC ;  Surgeon: Theodoro Kos, DO;  Location: [place];  Service: Plastics;  Laterality: N/A;  . Incision and drainage of wound N/A 11/18/2014    Procedure: IRRIGATION AND DEBRIDEMENT OF SACRAL ULCER ONLY WITH PLACEMENT OF A CELL AND VAC/ DRESSING CHANGE TO UPPER BACK AREA.;  Surgeon: Theodoro Kos, DO;  Location: [place];  Service: Plastics;  Laterality: N/A;  . Incision and drainage of wound N/A 11/25/2014    Procedure: IRRIGATION AND DEBRIDEMENT OF SACRAL ULCER AND BACK BURN WITH PLACEMENT OF A-CELL;  Surgeon: Theodoro Kos, DO;  Location: [place];  Service: Plastics;  Laterality: N/A;  . Minor application of wound vac N/A 11/25/2014    Procedure:  WOUND VAC CHANGE;  Surgeon: Theodoro Kos, DO;  Location: [place];  Service: Plastics;  Laterality: N/A;  . Incision and drainage of wound Right 12/08/2014    Procedure: IRRIGATION AND DEBRIDEMENT SACRAL WOUND AND RIGHT ISCHIAL WOUND ;  Surgeon: Theodoro Kos, DO;  Location: [place];  Service: Plastics;  Laterality: Right;  . Application of a-cell of back N/A 12/08/2014    Procedure: APPLICATION OF A-CELL AND WOUND VAC ;  Surgeon: Theodoro Kos, DO;  Location: [place];  Service: Plastics;  Laterality: N/A;  . Tee without cardioversion N/A  05/05/2015    Procedure: TRANSESOPHAGEAL ECHOCARDIOGRAM (TEE);  Surgeon: Lelon Perla, MD;  Location: [place];  Service: Cardiovascular;  Laterality: N/A;  . Incision and drainage of wound Bilateral 05/05/2015    Procedure: IRRIGATION AND DEBRIDEMENT SACRAL ULCER;  Surgeon: Theodoro Kos, DO;  Location: [place];  Service: Plastics;  Laterality: Bilateral;  . Hematoma evacuation N/A 05/05/2015    Procedure: EVACUATION HEMATOMA bedside procedure;  Surgeon: Theodoro Kos, DO;  Location: [place];  Service: Plastics;  Laterality: N/A;    Allergies: Cefuroxime axetil; Ertapenem; Morphine and related; Penicillins; Sulfa antibiotics; Tessalon; Shellfish allergy; and Miripirium  Medications: Prior to Admission medications   Medication Sig Start Date End Date Taking? Authorizing Provider  acetaminophen (TYLENOL) 325 MG tablet Take 2 tablets (650 mg total) by mouth every 6 (six) hours as needed for mild pain, moderate pain, fever or headache. 01/14/15  Yes Geradine Girt, DO  albuterol (PROVENTIL) (2.5 MG/3ML) 0.083% nebulizer solution Take 3 mLs (2.5 mg total) by nebulization every 4 (four) hours as needed for wheezing or shortness of breath. 04/27/14  Yes [name], MD  apixaban (ELIQUIS) 5 MG TABS tablet Take 1 tablet (5 mg total) by mouth 2 (two) times daily. 04/14/15  Yes [name], MD  B Complex-Folic Acid (B COMPLEX-VITAMIN B12 PO) Take 1 tablet by mouth at bedtime.   Yes Historical Provider, MD  baclofen (LIORESAL) 10 MG tablet Take 1-2 tablets (10-20 mg total) by mouth 3 (three) times daily. Takes 10mg  in morning and lunchtime and takes 20mg  at bedtime 11/29/14  Yes Meredith Staggers, MD  collagenase (SANTYL) ointment Apply 1 application topically daily. 08/30/14  Yes [name], MD  diclofenac sodium (VOLTAREN) 1 % GEL Apply 2 g topically daily as needed (for pain). 11/19/14  Yes Shanker Kristeen Mans, MD  feeding supplement, GLUCERNA SHAKE, (GLUCERNA SHAKE) LIQD Take 237 mLs by mouth 3 (three)  times daily between meals. 12/10/14  Yes Florencia Reasons, MD  fentaNYL (DURAGESIC - DOSED MCG/HR) 25 MCG/HR patch Place 1 patch (25 mcg total) onto the skin every 3 (three) days. 04/25/15  Yes Bayard Hugger, NP  glucagon (GLUCAGON EMERGENCY) 1 MG injection Inject 1 mg into the vein once as needed. 04/20/14  Yes [name], MD  insulin aspart (NOVOLOG) 100 UNIT/ML injection Inject 5 Units into the skin 3 (three) times daily with meals. 01/14/15  Yes Jessica U Vann, DO  insulin glargine (LANTUS) 100 UNIT/ML injection Inject 0.3 mLs (30 Units total) into the skin at bedtime. Patient taking differently: Inject 20 Units into the skin at bedtime.  03/05/15  Yes Velvet Bathe, MD  loratadine (CLARITIN) 10 MG tablet Take 10 mg by mouth at bedtime.    Yes Historical Provider, MD  metoCLOPramide (REGLAN) 5 MG tablet Give 3 doses a week Patient taking differently: Take 5 mg by mouth 2 (two) times a week.  04/08/15  Yes [name], MD  midodrine (PROAMATINE) 10 MG tablet Take 1 tablet (10 mg total) by mouth 3 (three) times daily with meals. 04/04/15  Yes [name], MD  mirtazapine (REMERON) 7.5 MG tablet Take 1 tablet (7.5 mg total) by mouth at bedtime. 04/04/15  Yes [name], MD  Multiple Vitamin (MULTIVITAMIN WITH MINERALS) TABS tablet Take 1 tablet by mouth daily.   Yes Historical Provider, MD  mupirocin ointment (BACTROBAN) 2 % 1 application QD 10/27/39  Yes Historical Provider, MD  omeprazole (PRILOSEC) 40 MG capsule Take 40 mg by mouth at bedtime.    Yes Historical Provider, MD  ondansetron (ZOFRAN ODT) 8 MG disintegrating tablet Take 1 tablet (8 mg total) by mouth every 8 (eight) hours as needed for nausea or vomiting. 12/29/14  Yes [name], MD  Oxycodone HCl 10 MG TABS Take 1 tablet (10 mg total) by mouth every 6 (six) hours as needed. Patient taking differently: Take 10-20 mg by mouth every 6 (six) hours as needed (pain). Pt can have 2 tablets when he goes to the wound center for dressing change  04/25/15  Yes Bayard Hugger, NP  pregabalin (LYRICA) 100 MG capsule Take 1 capsule (100 mg total) by mouth 3 (three) times daily. Take 100 mg by mouth in the morning, take 100 mg by mouth in the afternoon, take 100 mg by mouth in the evening and take 200 mg by mouth at bedtime. 03/05/15  Yes Velvet Bathe, MD  ACCU-CHEK SMARTVIEW test strip Use to check blood sugar 4 times daily. 05/16/15   Renato Shin, MD  metroNIDAZOLE (FLAGYL) 500 MG tablet Take 1 tablet (500 mg total) by mouth every 8 (eight) hours. Patient not taking: Reported on 04/25/2015 03/05/15   Velvet Bathe, MD  nitrofurantoin, macrocrystal-monohydrate, (MACROBID) 100 MG capsule Take 1 capsule (100 mg total) by mouth 2 (two) times daily. Patient not taking: Reported on 04/25/2015 04/01/15   Montine Circle, PA-C  Vancomycin (VANCOCIN) 750 MG/150ML SOLN Inject 150 mLs (750 mg total) into the vein every 12 (twelve) hours. Patient not taking: Reported on 04/25/2015 03/05/15   Velvet Bathe, MD     Family History  Problem Relation Age of Onset  . Diabetes Father   . Hypertension Father   . Asthma      fhx  . Hypertension      fhx  . Stroke      fhx  . Heart disease Mother     Social History   Social History  . Marital Status: Single    Spouse Name: N/A  . Number of Children: 0  . Years of Education: 11   Occupational History  . Disability    Social History Main Topics  . Smoking status: Former Smoker -- 1.00 packs/day for 12 years    Types: Cigars, Cigarettes    Quit date: 09/07/2014  . Smokeless tobacco: Never Used  . Alcohol Use: No  . Drug Use: No  . Sexual Activity: Not Currently   Other Topics Concern  . None   Social History Narrative   Patient lives at home with mother father.    Patient has 2 children.    Patient is single.    Patient was left hand but after stroke patient is now right handed.    Patient has  11th grade education.    Review of Systems: A 12 point ROS discussed and pertinent positives are  indicated in the HPI above.  All other systems are negative.  Review of Systems  Vital Signs: BP 115/84 mmHg  Pulse 101  Temp(Src) 98.1 F (36.7 C) (Axillary)  Resp 21  Ht 5\' 8"  (1.727 m)  Wt 158 lb (71.668 kg)  BMI 24.03 kg/m2  SpO2 100%  Physical Exam General: On vent, tracheostomy intact Heart: Tachycardic Lungs: Loud rhonchi throughout  Abd: LLQ Ostomy intact, suprapubic catheter intact  Mallampati Score:  MD Evaluation Airway:  (Tracheostomy ) Airway comments: tracheostomy now present Heart: WNL Abdomen: WNL Chest/ Lungs:  (Tracheostomy/vent) ASA  Classification: 3 Mallampati/Airway Score:  (Tracheostomy )  Imaging: Ct Pelvis W Contrast  05/02/2015   CLINICAL DATA:  Pelvic abscess.  EXAM: CT PELVIS WITH CONTRAST  TECHNIQUE: Multidetector CT imaging of the pelvis was performed using the standard protocol following the bolus administration of intravenous contrast.  CONTRAST:  142mL OMNIPAQUE IOHEXOL 300 MG/ML  SOLN  COMPARISON:  MRI scan of April 28, 2015; CT scan of December 11, 2014.  FINDINGS: The patient appears to be status post surgical resection of the distal sacrum and coccyx. Overlying sacral decubitus ulcer is noted. Right buttocks ulceration is seen extending to the ischial tuberosity, where some lucency is noted suggesting osteomyelitis. Moderate ascites is noted. Ostomy is noted in left lower quadrant. Suprapubic catheter is noted within the urinary bladder. There is no evidence of bowel obstruction. The appendix appears normal. Moderate soft tissue swelling or anasarca is noted in the visualized soft tissues. Two fluid collections are noted along the right side of the pelvic wall just medial to right acetabulum ; 1 measures 27 x 13 mm superiorly. The other measures 36 x 12 mm more inferiorly. These appear to communicate posteriorly.  IMPRESSION: Moderate ascites.  Moderate anasarca is noted.  Status post surgical resection of distal sacrum and coccyx with overlying  sacral decubitus ulcer.  Large deep ulceration is seen involving right buttocks area which extends to the cortex of the right ischial tuberosity, where some lucency is noted suggesting osteomyelitis. Two abscesses are noted in this area along the medial portion of the right acetabulum which appear to communicate posteriorly.   Electronically Signed   By: Marijo Conception, M.D.   On: 05/02/2015 14:47   Mr Pelvis Wo Contrast  04/28/2015   CLINICAL DATA:  Sacral decubitus ulcer.  Quadriplegia.  EXAM: MRI PELVIS WITHOUT CONTRAST  TECHNIQUE: Multiplanar multisequence MR imaging of the pelvis was performed. No intravenous contrast was administered.  COMPARISON:  MRIs dated 02/19/2015 and 01/08/2015  FINDINGS: There is a abscess involving the deep aspect of the right internal obturator muscle. The pus collection is approximately 2 cm in diameter and contains air. The abscess is immediately medial to the roof of the right acetabulum.  The deep decubitus ulcers overlying the ischial tuberosities extend more deeply than on the prior study. There has a either been debridement or destruction of a portion of the right inferior pubic ramus and right ischial tuberosities since the prior study. There is persistent abnormal signal in the remnants of the right inferior pubic ramus extending to the right acetabulum consistent with chronic osteomyelitis.  The left decubitus ulcer now extends directly to the surface of the left ischial tuberosity but there is no bone destruction to suggest osteomyelitis.  Since the prior study the central portion of the fourth sacral segment has been resected or destroyed. No visible osteomyelitis of the sacral remnants.  Suprapubic catheter noted in the bladder. Diffuse thickening of the bladder wall.  IMPRESSION: 1. Right internal obturator abscess, appearing to extend from the right ischial  tuberosity decubitus ulcer. 2. Persistent abnormal signal from the right inferior pubic ramus remnants  extending into the posterior aspect of the right acetabulum. 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   Electronically Signed   By: Lorriane Shire M.D.   On: 04/28/2015 12:34   Mr Heel Left Wo Contrast  04/28/2015   CLINICAL DATA:  Heel pain.  Type 1 diabetes.  EXAM: MR OF THE LEFT HEEL WITHOUT CONTRAST  TECHNIQUE: Multiplanar, multisequence MR imaging was performed. No intravenous contrast was administered.  COMPARISON:  01/11/2015  FINDINGS: Peroneal: Peroneal longus tendon intact. Peroneal brevis intact.  Posteromedial: Posterior tibial tendon intact. Flexor hallucis longus tendon intact. Flexor digitorum longus tendon intact.  Anterior: Tibialis anterior tendon intact. Extensor hallucis longus tendon intact Extensor digitorum longus tendon intact.  Achilles: Intact.  Plantar Fascia: Intact.  LIGAMENTS  Medial: Deltoid ligament intact. Spring ligament intact.  Lateral: Anterior talofibular ligament intact. Posterior talofibular ligament intact. Anterior and posterior tibiofibular ligaments intact.  Cartilage: General chondral thinning of the tibiotalar joint.  Ankle Joint: No joint effusion. No dislocation.  Subtalar Joint: No joint effusion. Normal subtalar joints.  Sinus Tarsi: Normal.  Bones: No marrow signal abnormality. No bone destruction or periosteal reaction.  Soft tissue: Small soft tissue ulcer over the posterior calcaneus with adjacent soft tissue edema within the subcutaneous fat. No drainable fluid collection or hematoma. Increased T2 signal throughout the plantar musculature likely neurogenic.  IMPRESSION: 1. No osteomyelitis of the left ankle. Soft tissue ulcer and surrounding edema overlying plantar aspect of the posterior calcaneus without an abscess.   Electronically Signed   By: Kathreen Devoid   On: 04/28/2015 11:41   Ir Ivc Filter Plmt / S&i /img Guid/mod Sed  05/14/2015   CLINICAL DATA:  29 year old male with a history of quadriplegia.  The patient has had excessive bleeding after  multiple debridement of sacral decubitus ulcer.  Given that he cannot receive anti coagulation, and has had recent pulmonary embolism and thrombus of the lower extremity, IVC filter is indicated.  EXAM: 1. ULTRASOUND GUIDANCE FOR VASCULAR ACCESS OF THE RIGHT INTERNAL JUGULAR VEIN. 2. IVC VENOGRAM. 3. PERCUTANEOUS IVC FILTER PLACEMENT.  ANESTHESIA/SEDATION: Fifty mcg IV Fentanyl.  Total Moderate Sedation Time  ZeroMinutes.  CONTRAST:  73mL OMNIPAQUE IOHEXOL 300 MG/ML  SOLN  FLUOROSCOPY TIME:  54 seconds  PROCEDURE: The procedure, risks, benefits, and alternatives were explained to the patient and the patient's family. Questions regarding the procedure were encouraged and answered. The patient understands and consents to the procedure.  The patient was prepped with Betadine in a sterile fashion, and a sterile drape was applied covering the operative field. A sterile gown and sterile gloves were used for the procedure. Local anesthesia was provided with 1% Lidocaine.  Under direct ultrasound guidance, a 21 gauge needle was advanced into the right internal jugular vein with ultrasound image documentation performed. After securing access with a micropuncture dilator, a guidewire was advanced into the inferior vena cava. A deployment sheath was advanced over the guidewire. This was utilized to perform IVC venography.  The deployment sheath was further positioned in an appropriate location for filter deployment. A retrievable Bard Denali IVC filter was then advanced in the sheath. This was then fully deployed in the infrarenal IVC. Final filter position was confirmed with a fluoroscopic image. Contrast injection was also performed through the sheath under fluoroscopy to confirm patency of the IVC at the level of the filter. After the procedure the sheath was removed and hemostasis obtained  with manual compression.  Patient tolerated the procedure well and remained hemodynamically stable throughout.  No complications were  encountered and no significant blood loss was encountered.  COMPLICATIONS: None.  FINDINGS: IVC venography demonstrates a normal caliber IVC with no evidence of thrombus. Renal veins are identified bilaterally at the level of L1. The IVC filter was successfully positioned below the level of the renal veins and is appropriately oriented. This IVC filter has both permanent and retrievable indications.  IMPRESSION: Status post IVC filter placement.  This filter is potentially retrievable, if the patient is ever deemed a candidate for anti coagulation.  Signed,  Dulcy Fanny. Earleen Newport, DO  [facility]  [facility]   Electronically Signed   By: Corrie Mckusick D.O.   On: 05/14/2015 12:05   Dg Chest Port 1 View  05/14/2015   CLINICAL DATA:  29 year old male with history of pneumonia.  EXAM: PORTABLE CHEST - 1 VIEW  COMPARISON:  Chest x-ray 05/13/2015.  FINDINGS: A tracheostomy tube is in place with tip 4.2 cm above the carina. A nasogastric tube is seen extending into the stomach, however, the tip of the nasogastric tube extends below the lower margin of the image. Previously noted right upper extremity PICC is been removed. Lung volumes are low. Persistent multifocal airspace disease in the lungs bilaterally (right greater than left), concerning for multilobar pneumonia. Moderate right pleural effusion. Small left pleural effusion. Extensive bibasilar opacities also likely reflect some concurrent atelectasis. Cardiac silhouette is largely obscured, but does not appear enlarged. Upper mediastinal contours are distorted by patient positioning.  IMPRESSION: 1. Support apparatus, as above. 2. Asymmetrically distributed airspace disease in the lungs bilaterally, most compatible with severe multilobar pneumonia. 3. Small left and moderate right-sided pleural effusions.   Electronically Signed   By: Vinnie Langton M.D.   On: 05/14/2015 07:40   Dg Chest Port 1 View  05/13/2015    CLINICAL DATA:  Pneumonia.  EXAM: PORTABLE CHEST - 1 VIEW  COMPARISON:  05/12/2015.  FINDINGS: Tracheostomy tube and NG tube in stable position. Right PICC line stable position. Heart size normal. Diffuse bilateral pulmonary infiltrates and bilateral pleural effusions again noted. No significant interim change. No pneumothorax.  IMPRESSION: 1.  Lines and tubes in stable position.  2. Diffuse unchanged bilateral airspace disease and pleural effusions.   Electronically Signed   By: Marcello Moores  Register   On: 05/13/2015 07:41   Dg Chest Port 1 View  05/12/2015   CLINICAL DATA:  Sepsis and pneumonia. Status post tracheostomy tube placement.  EXAM: PORTABLE CHEST - 1 VIEW  COMPARISON:  Single view of the chest 05/12/2015 12:42 a.m.  FINDINGS: The patient's endotracheal tube has been removed with a new tracheostomy tube in place. The tube projects in good position. Right PICC and NG tube are again seen. Diffuse bilateral airspace disease persists. Aeration appears worsened. The cardiac silhouette is obscured. No pneumothorax identified.  IMPRESSION: New tracheostomy tube projects in good position.  Worsened diffuse bilateral airspace disease.   Electronically Signed   By: Inge Rise M.D.   On: 05/12/2015 16:01   Dg Chest Port 1 View  05/12/2015   CLINICAL DATA:  Respiratory failure  EXAM: PORTABLE CHEST - 1 VIEW  COMPARISON:  05/11/2015  FINDINGS: The endotracheal tube tip is 2.2 cm above the carina. There is a right upper extremity PICC line extending into the low SVC. The nasogastric tube extends into the stomach. No pneumothorax is evident. Airspace opacities persist in the central  and basilar regions bilaterally.  IMPRESSION: Support equipment appears satisfactorily positioned.  No significant interval change in the bilateral airspace opacities.   Electronically Signed   By: Andreas Newport M.D.   On: 05/12/2015 00:55   Dg Chest Port 1 View  05/11/2015   CLINICAL DATA:  Patient with acute respiratory failure  and hypoxemia.  EXAM: PORTABLE CHEST - 1 VIEW  COMPARISON:  Chest radiograph 05/11/2015  FINDINGS: Monitoring leads overlie the patient. Right upper extremity PICC line tip projects over the right atrium. The cardiac and mediastinal contours are largely obscured. Interval extubation and removal of enteric tube. Interval worsening of bilateral heterogeneous pulmonary opacities with layering moderate bilateral pleural effusions. No definite pneumothorax.  IMPRESSION: Interval worsening bilateral consolidative pulmonary opacities as well as layering moderate bilateral pleural effusions. Findings are nonspecific however may be secondary to edema or underlying infection.   Electronically Signed   By: Lovey Newcomer M.D.   On: 05/11/2015 23:10   Dg Chest Port 1 View  05/11/2015   CLINICAL DATA:  Intubation.  EXAM: PORTABLE CHEST - 1 VIEW  COMPARISON:  05/10/2015.  FINDINGS: Endotracheal tube, NG tube, right PICC line in stable position. Heart size normal. Partial clearing of diffuse bilateral pulmonary infiltrates. Partial improvement of bilateral pleural effusions. No pneumothorax. No acute bony abnormality.  IMPRESSION: 1. Lines and tubes in stable position. 2. Persistent but partially clearing diffuse bilateral pulmonary infiltrates and pleural effusions. Heart size normal.   Electronically Signed   By: Marcello Moores  Register   On: 05/11/2015 07:01   Dg Chest Port 1 View  05/10/2015   CLINICAL DATA:  Pneumonia, intubated patient.  EXAM: PORTABLE CHEST - 1 VIEW  COMPARISON:  Portable chest x-ray of May 09, 2015  FINDINGS: The appearance of the chest has deteriorated with increasing pleural fluid volumes bilaterally. Interstitial edema has worsened as well. The cardiac silhouette is ill defined as is the pulmonary vascularity.  The endotracheal tube tip lies approximately 2 cm above the carina. The esophagogastric tube tip projects below the inferior margin of the image. The right PICC line tip projects over the midportion  of the SVC.  IMPRESSION: Interval deterioration in the appearance of the chest consistent with worsening of bilateral pleural effusions and interstitial edema/pneumonia.   Electronically Signed   By: David  Martinique M.D.   On: 05/10/2015 07:12   Portable Chest Xray  05/09/2015   CLINICAL DATA:  Respiratory failure, sepsis, decubitus ulcers, history of asthma and diabetes and previous CVA  EXAM: PORTABLE CHEST - 1 VIEW  COMPARISON:  Portable chest x-ray of May 08, 2015  FINDINGS: The lungs are adequately inflated. There are persistent small to moderate-sized pleural effusions. There are patchy airspace opacities on the right consistent with pneumonia or pulmonary edema. Similar but less conspicuous findings are noted on the left in the mid and lower lung. The heart is normal in size. The pulmonary vascularity is not engorged. The endotracheal tube tip lies approximately 3 cm above the carina. The esophagogastric tube tip projects below the inferior margin of the image. The right subclavian venous catheter has its tip at the cavoatrial junction.  IMPRESSION: Persistent bilateral airspace opacities greater on the right than on the left consistent with pneumonia or pulmonary edema. There are moderate-sized bilateral pleural effusions. The support tubes are in reasonable position.   Electronically Signed   By: David  Martinique M.D.   On: 05/09/2015 07:24   Portable Chest Xray  05/08/2015   CLINICAL DATA:  Acute respiratory  failure and hypoxia.  Intubation.  EXAM: PORTABLE CHEST - 1 VIEW  COMPARISON:  05/08/2015  FINDINGS: An endotracheal tube with tip 2 cm above the carina, right PICC line with tip overlying the superior cavoatrial junction, and NG tube within the stomach noted.  Airspace opacities throughout the right lung and left lower lung again noted.  Left lower lung consolidation/ atelectasis and bilateral pleural effusions are unchanged.  There is no evidence of pneumothorax.  IMPRESSION: Support apparatus as  described. Endotracheal tube tip 2 cm above the carina.  Unchanged bilateral airspace opacities, left lower lung consolidation/atelectasis and bilateral pleural effusions.   Electronically Signed   By: Margarette Canada M.D.   On: 05/08/2015 08:25   Dg Chest Port 1 View  05/08/2015   CLINICAL DATA:  Shortness of breath.  EXAM: PORTABLE CHEST - 1 VIEW  COMPARISON:  Yesterday at 2324 hour  FINDINGS: Tip of the right upper extremity PICC in the SVC. Progressive opacification throughout the right hemithorax, likely related to layering pleural effusion. Left pleural effusion and basilar opacity is unchanged. Decreased esophageal dilatation from prior. Cardiomediastinal contours are unchanged. No pneumothorax.  IMPRESSION: Progressive opacification throughout the right hemithorax, like related to layering of pleural effusion. Left pleural effusion and basilar opacity is unchanged.   Electronically Signed   By: Jeb Levering M.D.   On: 05/08/2015 06:55   Dg Chest Port 1 View  05/07/2015   CLINICAL DATA:  Dyspnea.  EXAM: PORTABLE CHEST - 1 VIEW  COMPARISON:  Earlier this day at 0541 hour  FINDINGS: Tip of the right upper extremity PICC at the atrial caval junction. Increased right pleural effusion from prior exam. Unchanged left pleural effusion. Cardiomediastinal contours are obscured. Presumed gaseous distention of esophagus in the mid thorax. No pneumothorax.  IMPRESSION: 1. Increased large right pleural effusion. Unchanged large left pleural effusion. 2. Presumed gaseous distention of esophagus in the mid thorax.   Electronically Signed   By: Jeb Levering M.D.   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 Respiratory distress.  EXAM: PORTABLE CHEST - 1 VIEW  COMPARISON:  Plain film 04/25/2015, 03/30/2015, CT 02/20/2015  FINDINGS: Cardiomediastinal silhouette likely unchanged, partially obscured  by overlying lung/ pleural disease.  Interval development of dense bilateral lung opacities at the bases. No pneumothorax.  Interval placement of right upper extremity PICC, with the tip appearing to terminate at the superior vena cava.  IMPRESSION: Interval development of large bilateral pleural effusions with associated atelectasis and/ or consolidation.  Interval placement of right upper extremity PICC, appearing to terminate at the superior vena cava.  Signed,  Dulcy Fanny. Earleen Newport, DO  [facility]  [facility]   Electronically Signed   By: Corrie Mckusick D.O.   On: 05/07/2015 09:07   Dg Chest Port 1 View  04/25/2015   CLINICAL DATA:  Hypotension  EXAM: PORTABLE CHEST - 1 VIEW  COMPARISON:  March 30, 2015  FINDINGS: There is bibasilar interstitial infiltrate. Lungs elsewhere clear. Heart size and pulmonary vascularity are normal. No adenopathy. 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  IMPRESSION: Bibasilar interstitial infiltrate. Suspect atypical pneumonia as most likely etiology. Differential considerations must include allergic type reaction or atypical noncardiogenic edema. Lungs elsewhere clear. No change in cardiac silhouette.   Electronically Signed   By: Lowella Grip III M.D.   On: 04/25/2015 16:03   Dg  Abd Portable 1v  05/11/2015   CLINICAL DATA:  Pulmonary emboli.  Evaluate for IVC filter.  EXAM: PORTABLE ABDOMEN - 1 VIEW  COMPARISON:  None.  FINDINGS: Soft tissue structures are unremarkable. NG tube noted coiled in the stomach. The stomach is slight distended. There is a paucity of small and large bowel intraluminal air. Gastric obstruction cannot be completely excluded. Ostomy noted left lower quadrant. Pelvic calcifications consistent phleboliths. Foley catheter noted. No acute bony abnormality identified. No IVC filter noted .  IMPRESSION: 1. No IVC filter noted. 2. NG tube noted coiled in the stomach. There is mild gastric distention with a paucity  of distal bowel gas. Gastric outlet obstruction cannot be completely excluded .   Electronically Signed   By: Marcello Moores  Register   On: 05/11/2015 09:26    Labs:  CBC:  Recent Labs  05/14/15 2005 05/15/15 0443 05/16/15 0534 05/18/15 0251  WBC 30.3* 27.3* 20.1* 35.4*  HGB 9.0* 8.0* 7.9* 7.0*  HCT 25.6* 23.8* 23.4* 21.3*  PLT 225 238 260 206    COAGS:  Recent Labs  12/02/14 0410 05/06/15 1600  INR 1.10 1.80*    BMP:  Recent Labs  05/15/15 0443 05/16/15 0534 05/17/15 0450 05/18/15 0251  NA 142 141 141 143  K 4.8 3.2* 3.9 4.2  CL 117* 113* 114* 116*  CO2 13* 21* 22 22  GLUCOSE 182* 212* 148* 229*  BUN 16 16 15 17   CALCIUM 7.2* 7.2* 7.0* 7.1*  CREATININE 0.64 0.36* 0.38* 0.40*  GFRNONAA >60 >60 >60 >60  GFRAA >60 >60 >60 >60    LIVER FUNCTION TESTS:  Recent Labs  04/27/15 0815 04/28/15 0640 05/10/15 0536 05/11/15 0511  BILITOT 0.5 0.4 0.3 0.3  AST 12* 8* 29 23  ALT 7* 7* 12* 13*  ALKPHOS 206* 173* 105 95  PROT 6.0* 5.3* 4.2* 4.1*  ALBUMIN 1.2* 1.1* <1.0* <1.0*    Assessment and Plan: Respiratory failure on Vent s/p Tracheostomy, PCCM following  HCAP on IV antibiotics, ID following MRSA bacteremia, 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 All questions were answered, patient's mother is agreeable to proceed. Consent signed and in chart. Will follow chart and labs next week to see when G-tube  is appropriate.      Thank you for this interesting consult.  I greatly enjoyed meeting Daanish W Doutt and look forward to participating in their care.  A copy of this report was sent to the requesting provider on this date.  SignedHedy Jacob 05/19/2015, 12:32 PM   I spent a total of 40 Minutes in face to face in clinical consultation, greater than 50% of which was counseling/coordinating care for malnutrition

## 2015-05-19 NOTE — Trach Care Team (Signed)
[facility] Progression Note   Patient Details Name: Jason [name] MRN: [id number] DOB: 07-Feb-1986 Today's Date: 05/19/2015   Tracheostomy Assessment    Tracheostomy Shiley 8 mm Cuffed (Active)  Status Secured;Passy Muir Speaking valve 05/19/2015  9:00 AM  Site Assessment Clean;Dry 05/19/2015  9:00 AM  Site Care Cleansed 05/18/2015  3:37 AM  Inner Cannula Care Changed/new 05/19/2015  4:22 AM  Ties Assessment Clean;Dry;Secure 05/19/2015  9:00 AM  Cuff pressure (cm) 32 cm 05/18/2015  9:23 PM  Trach Changed Yes 05/12/2015  3:29 PM  Emergency Equipment at bedside Yes 05/19/2015  9:00 AM     Care Needs     Respiratory Therapy Tracheostomy: Chronic trach O2 Device: Ventilator FiO2 (%): 40 % SpO2: 99 % Education: Not applicable Follow up recommendations:  (Will con't to follow for progress)    Speech Language Pathology  SLP chart review complete: Patient not ready for SLP services (No ATC yet, pt needs positive pressure d/t lung collapse) Patient may use Passy-Muir Speech Valve: with SLP only PMSV Supervision: Full Follow up Recommendations: Skilled Nursing facility, Home health SLP   Physical Therapy      Occupational Therapy      Nutritional Patient's Current Diet: Tube feeding Tube Feeding: Vital AF 1.2 Cal Tube Feeding Frequency: Continuous Tube Feeding Strength: Full strength    Case Management/Social Work      Clinical biochemist Care Team/Provider Recommendations [facility] Team Member Present-  Tammie Reading, RT    Chronic trach with vent support. Continue to follow.           Aili Casillas, Jaci Carrel (scribe for team) 05/19/2015, 2:32 PM

## 2015-05-19 NOTE — Progress Notes (Signed)
[facility] for Infectious Disease    Subjective:  Pt without new complaints   Antibiotics:  Anti-infectives    Start     Dose/Rate Route Frequency Ordered Stop   05/18/15 0030  vancomycin (VANCOCIN) IVPB 1000 mg/200 mL premix     1,000 mg 200 mL/hr over 60 Minutes Intravenous Every 24 hours 05/18/15 0017     05/14/15 0029  vancomycin (VANCOCIN) IVPB 750 mg/150 ml premix  Status:  Discontinued     750 mg 150 mL/hr over 60 Minutes Intravenous Every 24 hours 05/14/15 0029 05/18/15 0017   05/13/15 1000  gentamicin (GARAMYCIN) 520 mg in dextrose 5 % 100 mL IVPB  Status:  Discontinued     7 mg/kg  74.5 kg 113 mL/hr over 60 Minutes Intravenous Every 48 hours 05/12/15 0044 05/17/15 1403   05/12/15 0000  tigecycline (TYGACIL) 50 mg in sodium chloride 0.9 % 100 mL IVPB     50 mg 200 mL/hr over 30 Minutes Intravenous Every 12 hours 05/11/15 0932     05/11/15 1200  tigecycline (TYGACIL) 100 mg in sodium chloride 0.9 % 100 mL IVPB     100 mg 200 mL/hr over 30 Minutes Intravenous  Once 05/11/15 0932 05/11/15 1443   05/11/15 1015  gentamicin (GARAMYCIN) 520 mg in dextrose 5 % 100 mL IVPB  Status:  Discontinued     7 mg/kg  74.5 kg 113 mL/hr over 60 Minutes Intravenous Every 24 hours 05/11/15 1000 05/12/15 0043   05/09/15 0930  ciprofloxacin (CIPRO) IVPB 400 mg  Status:  Discontinued     400 mg 200 mL/hr over 60 Minutes Intravenous Every 12 hours 05/09/15 0904 05/11/15 0823   05/08/15 0900  aztreonam (AZACTAM) 2 g in dextrose 5 % 50 mL IVPB  Status:  Discontinued     2 g 100 mL/hr over 30 Minutes Intravenous 3 times per day 05/08/15 0826 05/11/15 0823   05/06/15 0000  vancomycin (VANCOCIN) IVPB 750 mg/150 ml premix  Status:  Discontinued     750 mg 150 mL/hr over 60 Minutes Intravenous Every 48 hours 05/05/15 2357 05/14/15 0030   04/28/15 2000  vancomycin (VANCOCIN) IVPB 750 mg/150 ml premix  Status:  Discontinued     750 mg 150 mL/hr over 60 Minutes Intravenous Every 24  hours 04/28/15 0731 05/04/15 1932   04/26/15 0800  vancomycin (VANCOCIN) IVPB 750 mg/150 ml premix  Status:  Discontinued     750 mg 150 mL/hr over 60 Minutes Intravenous Every 12 hours 04/25/15 1715 04/28/15 0731   04/26/15 0200  aztreonam (AZACTAM) 1 g in dextrose 5 % 50 mL IVPB  Status:  Discontinued     1 g 100 mL/hr over 30 Minutes Intravenous Every 8 hours 04/25/15 1919 05/07/15 1035   04/25/15 1715  vancomycin (VANCOCIN) IVPB 750 mg/150 ml premix     750 mg 150 mL/hr over 60 Minutes Intravenous STAT 04/25/15 1715 04/25/15 1906   04/25/15 1645  aztreonam (AZACTAM) 2 g in dextrose 5 % 50 mL IVPB     2 g 100 mL/hr over 30 Minutes Intravenous  Once 04/25/15 1643 04/25/15 1807      Medications: Scheduled Meds: . albuterol  2.5 mg Nebulization Q4H  . antiseptic oral rinse  7 mL Mouth Rinse QID  . baclofen  10 mg Oral BID WC  . baclofen  20 mg Oral QHS  . chlorhexidine gluconate  15 mL Mouth Rinse BID  . fentaNYL  50 mcg Transdermal  Q72H  . insulin aspart  0-15 Units Subcutaneous 6 times per day  . loratadine  10 mg Oral QHS  . midodrine  20 mg Oral TID WC  . mirtazapine  7.5 mg Oral QHS  . pantoprazole sodium  40 mg Per Tube Daily  . pregabalin  100 mg Oral TID AC  . pregabalin  200 mg Oral QHS  . sodium chloride  3 mL Intracatheter Q12H  . tigecycline (TYGACIL) IVPB  50 mg Intravenous Q12H  . vancomycin  1,000 mg Intravenous Q24H   Continuous Infusions: . sodium chloride 10 mL/hr at 05/17/15 0016  . feeding supplement (VITAL AF 1.2 CAL) 1,000 mL (05/18/15 1124)   PRN Meds:.acetaminophen **OR** acetaminophen, albuterol, albuterol, fentaNYL (SUBLIMAZE) injection, midazolam, ondansetron, oxyCODONE, sodium chloride    Objective: Weight change:   Intake/Output Summary (Last 24 hours) at 05/19/15 1920 Last data filed at 05/19/15 1753  Gross per 24 hour  Intake 1288.83 ml  Output   7200 ml  Net -5911.17 ml   Blood pressure 112/90, pulse 96, temperature 100 F (37.8  C), temperature source Oral, resp. rate 17, height 5\' 8"  (1.727 m), weight 158 lb (71.668 kg), SpO2 100 %. Temp:  [98.1 F (36.7 C)-102 F (38.9 C)] 100 F (37.8 C) (08/11 1544) Pulse Rate:  [92-110] 96 (08/11 1800) Resp:  [12-22] 17 (08/11 1800) BP: (97-133)/(59-95) 112/90 mmHg (08/11 1800) SpO2:  [99 %-100 %] 100 % (08/11 1800) FiO2 (%):  [40 %] 40 % (08/11 1529)  Physical Exam: General: Alert and awake, oriented  HEENT: anicteric sclera, pupils reactive to light and accommodation, EOMI, tracheostomy CVS tachy rate, normal r,  no murmur rubs or gallops Chest:coarse breath sounds throughout Abdomen: soft , nondistended, ostomy bag with signficant stool, urine clean  Neuro: quadriplegic  Skin: not examined thoroughly today see prior note  CBC: CBC Latest Ref Rng 05/18/2015 05/16/2015 05/15/2015  WBC 4.0 - 10.5 K/uL 35.4(H) 20.1(H) 27.3(H)  Hemoglobin 13.0 - 17.0 g/dL 7.0(L) 7.9(L) 8.0(L)  Hematocrit 39.0 - 52.0 % 21.3(L) 23.4(L) 23.8(L)  Platelets 150 - 400 K/uL 206 260 238     BMET  Recent Labs  05/17/15 0450 05/18/15 0251  NA 141 143  K 3.9 4.2  CL 114* 116*  CO2 22 22  GLUCOSE 148* 229*  BUN 15 17  CREATININE 0.38* 0.40*  CALCIUM 7.0* 7.1*     Liver Panel  No results for input(s): PROT, ALBUMIN, AST, ALT, ALKPHOS, BILITOT, BILIDIR, IBILI in the last 72 hours.     Sedimentation Rate No results for input(s): ESRSEDRATE in the last 72 hours. C-Reactive Protein No results for input(s): CRP in the last 72 hours.  Micro Results: Recent Results (from the past 720 hour(s))  Urine culture     Status: None   Collection Time: 04/25/15  4:35 PM  Result Value Ref Range Status   Specimen Description URINE, CLEAN CATCH  Final   Special Requests Normal  Final   Culture   Final    >=100,000 COLONIES/mL ESCHERICHIA COLI AZTREONAM SENSITIVE ZONE 32MM Performed at [facility] Performed at [facility]    Report Status 05/02/2015 FINAL  Final    Organism ID, Bacteria ESCHERICHIA COLI  Final      Susceptibility   Escherichia coli - MIC*    AMPICILLIN <=2 SENSITIVE Sensitive     CEFAZOLIN <=4 SENSITIVE Sensitive     CEFTRIAXONE <=1 SENSITIVE Sensitive     CIPROFLOXACIN >=4 RESISTANT Resistant     GENTAMICIN <=  1 SENSITIVE Sensitive     IMIPENEM <=0.25 SENSITIVE Sensitive     NITROFURANTOIN <=16 SENSITIVE Sensitive     TRIMETH/SULFA <=20 SENSITIVE Sensitive     AMPICILLIN/SULBACTAM <=2 SENSITIVE Sensitive     PIP/TAZO <=4 SENSITIVE Sensitive     * >=100,000 COLONIES/mL ESCHERICHIA COLI  Blood culture (routine x 2)     Status: None   Collection Time: 04/25/15  5:00 PM  Result Value Ref Range Status   Specimen Description BLOOD RIGHT ANTECUBITAL  Final   Special Requests BOTTLES DRAWN AEROBIC AND ANAEROBIC 5 CC EA  Final   Culture  Setup Time   Final    GRAM POSITIVE COCCI IN CLUSTERS Gram Stain Report Called to,Read Back By and Verified With: Elveria Rising RN 17:30 04/26/15 ([email or web address]) IN BOTH AEROBIC AND ANAEROBIC BOTTLES    Culture   Final    METHICILLIN RESISTANT STAPHYLOCOCCUS AUREUS MICROAEROPHILIC STREPTOCOCCI Standardized susceptibility testing for this organism is not available. Performed at [facility]    Report Status 04/30/2015 FINAL  Final   Organism ID, Bacteria METHICILLIN RESISTANT STAPHYLOCOCCUS AUREUS  Final      Susceptibility   Methicillin resistant staphylococcus aureus - MIC*    CIPROFLOXACIN >=8 RESISTANT Resistant     ERYTHROMYCIN >=8 RESISTANT Resistant     GENTAMICIN <=0.5 SENSITIVE Sensitive     OXACILLIN >=4 RESISTANT Resistant     TETRACYCLINE <=1 SENSITIVE Sensitive     VANCOMYCIN <=0.5 SENSITIVE Sensitive     TRIMETH/SULFA >=320 RESISTANT Resistant     CLINDAMYCIN >=8 RESISTANT Resistant     RIFAMPIN <=0.5 SENSITIVE Sensitive     Inducible Clindamycin NEGATIVE Sensitive     * METHICILLIN RESISTANT STAPHYLOCOCCUS AUREUS  Blood culture (routine x 2)     Status: None   Collection Time:  04/25/15  5:16 PM  Result Value Ref Range Status   Specimen Description BLOOD BLOOD RIGHT FOREARM  Final   Special Requests BOTTLES DRAWN AEROBIC AND ANAEROBIC 5 CC EA  Final   Culture  Setup Time   Final    GRAM POSITIVE COCCI IN CLUSTERS CRITICAL RESULT CALLED TO, READ BACK BY AND VERIFIED WITH: S GRAHAM 04/26/15 @ 31 M VESTAL IN BOTH AEROBIC AND ANAEROBIC BOTTLES    Culture   Final    METHICILLIN RESISTANT STAPHYLOCOCCUS AUREUS SUSCEPTIBILITIES PERFORMED ON PREVIOUS CULTURE WITHIN THE LAST 5 DAYS. MICROAEROPHILIC STREPTOCOCCI Standardized susceptibility testing for this organism is not available. Performed at [facility]    Report Status 05/03/2015 FINAL  Final  MRSA PCR Screening     Status: Abnormal   Collection Time: 04/25/15  6:55 PM  Result Value Ref Range Status   MRSA by PCR POSITIVE (A) NEGATIVE Final    Comment:        The GeneXpert MRSA Assay (FDA approved for NASAL specimens only), is one component of a comprehensive MRSA colonization surveillance program. It is not intended to diagnose MRSA infection nor to guide or monitor treatment for MRSA infections. RESULT CALLED TO, READ BACK BY AND VERIFIED WITH: Sharyn Lull REEVES,RN [id number] @ 2043 BY J SCOTTON   Culture, blood (routine x 2)     Status: None   Collection Time: 04/28/15  1:20 PM  Result Value Ref Range Status   Specimen Description BLOOD LEFT ARM  Final   Special Requests IN PEDIATRIC BOTTLE 1CC  Final   Culture  Setup Time   Final    GRAM POSITIVE COCCI IN CLUSTERS AEROBIC BOTTLE ONLY CRITICAL RESULT CALLED TO,  READ BACK BY AND VERIFIED WITH: Hetty Blend RN [id number] 04/30/15 A BROWNING    Culture   Final    MICROAEROPHILIC STREPTOCOCCI Standardized susceptibility testing for this organism is not available. Performed at [facility]    Report Status 05/03/2015 FINAL  Final  Culture, blood (routine x 2)     Status: None   Collection Time: 04/28/15  5:50 PM  Result Value Ref Range Status     Specimen Description BLOOD PICC LINE  Final   Special Requests BOTTLES DRAWN AEROBIC AND ANAEROBIC 5CC EACH  Final   Culture   Final    NO GROWTH 5 DAYS Performed at [facility]    Report Status 05/03/2015 FINAL  Final  Culture, blood (routine x 2)     Status: None   Collection Time: 05/04/15  6:30 PM  Result Value Ref Range Status   Specimen Description BLOOD PICC LINE  Final   Special Requests BOTTLES DRAWN AEROBIC AND ANAEROBIC 5ML  Final   Culture   Final    NO GROWTH 5 DAYS Performed at [facility]    Report Status 05/09/2015 FINAL  Final  Culture, blood (routine x 2)     Status: None   Collection Time: 05/04/15  6:30 PM  Result Value Ref Range Status   Specimen Description BLOOD PICC LINE  Final   Special Requests BOTTLES DRAWN AEROBIC AND ANAEROBIC 5ML  Final   Culture   Final    NO GROWTH 5 DAYS Performed at [facility]    Report Status 05/09/2015 FINAL  Final  Anaerobic culture     Status: None   Collection Time: 05/05/15  1:01 PM  Result Value Ref Range Status   Specimen Description ABSCESS  Final   Special Requests SPEC A ON SWAB RIGHT PELVIC ABSCESS  Final   Gram Stain   Final    FEW WBC PRESENT, PREDOMINANTLY MONONUCLEAR NO SQUAMOUS EPITHELIAL CELLS SEEN FEW GRAM POSITIVE COCCI IN PAIRS IN CLUSTERS Performed at [facility]    Culture   Final    NO ANAEROBES ISOLATED Performed at [facility]    Report Status 05/10/2015 FINAL  Final  Culture, routine-abscess     Status: None   Collection Time: 05/05/15  1:01 PM  Result Value Ref Range Status   Specimen Description ABSCESS  Final   Special Requests SPEC A ON SWAB RIGHT PELVIC ABSCESS  Final   Gram Stain   Final    FEW WBC PRESENT, PREDOMINANTLY MONONUCLEAR NO SQUAMOUS EPITHELIAL CELLS SEEN FEW GRAM POSITIVE COCCI IN PAIRS IN CLUSTERS Performed at [facility]    Culture   Final    MODERATE METHICILLIN RESISTANT STAPHYLOCOCCUS AUREUS Note: RIFAMPIN  AND GENTAMICIN SHOULD NOT BE USED AS SINGLE DRUGS FOR TREATMENT OF STAPH INFECTIONS. CRITICAL RESULT CALLED TO, READ BACK BY AND VERIFIED WITH: IREKIA B. 8/1 @216  BY REAMM Performed at [facility]    Report Status 05/09/2015 FINAL  Final   Organism ID, Bacteria METHICILLIN RESISTANT STAPHYLOCOCCUS AUREUS  Final      Susceptibility   Methicillin resistant staphylococcus aureus - MIC*    CLINDAMYCIN >=8 RESISTANT Resistant     ERYTHROMYCIN >=8 RESISTANT Resistant     GENTAMICIN <=0.5 SENSITIVE Sensitive     LEVOFLOXACIN >=8 RESISTANT Resistant     OXACILLIN >=4 RESISTANT Resistant     RIFAMPIN <=0.5 SENSITIVE Sensitive     TRIMETH/SULFA >=320 RESISTANT Resistant     VANCOMYCIN 1  SENSITIVE Sensitive     TETRACYCLINE <=1 SENSITIVE Sensitive     * MODERATE METHICILLIN RESISTANT STAPHYLOCOCCUS AUREUS  Tissue culture     Status: None   Collection Time: 05/05/15  1:13 PM  Result Value Ref Range Status   Specimen Description TISSUE  Final   Special Requests SPEC B IN CUP BONE RIGHT ISCHIUM  Final   Gram Stain   Final    RARE WBC PRESENT, PREDOMINANTLY MONONUCLEAR NO ORGANISMS SEEN Performed at [facility]    Culture   Final    FEW METHICILLIN RESISTANT STAPHYLOCOCCUS AUREUS Note: RIFAMPIN AND GENTAMICIN SHOULD NOT BE USED AS SINGLE DRUGS FOR TREATMENT OF STAPH INFECTIONS. CRITICAL RESULT CALLED TO, READ BACK BY AND VERIFIED WITH: IREKIA B. 8/1 @216  BY REAMM Performed at [facility]    Report Status 05/09/2015 FINAL  Final   Organism ID, Bacteria METHICILLIN RESISTANT STAPHYLOCOCCUS AUREUS  Final      Susceptibility   Methicillin resistant staphylococcus aureus - MIC*    CLINDAMYCIN >=8 RESISTANT Resistant     ERYTHROMYCIN >=8 RESISTANT Resistant     GENTAMICIN <=0.5 SENSITIVE Sensitive     LEVOFLOXACIN >=8 RESISTANT Resistant     OXACILLIN >=4 RESISTANT Resistant     RIFAMPIN <=0.5 SENSITIVE Sensitive     TRIMETH/SULFA >=320 RESISTANT Resistant      VANCOMYCIN 1 SENSITIVE Sensitive     TETRACYCLINE <=1 SENSITIVE Sensitive     * FEW METHICILLIN RESISTANT STAPHYLOCOCCUS AUREUS  Culture, respiratory (NON-Expectorated)     Status: None   Collection Time: 05/08/15  8:36 AM  Result Value Ref Range Status   Specimen Description TRACHEAL ASPIRATE  Final   Special Requests Normal  Final   Gram Stain   Final    RARE WBC PRESENT, PREDOMINANTLY PMN NO SQUAMOUS EPITHELIAL CELLS SEEN FEW GRAM NEGATIVE RODS RARE GRAM POSITIVE COCCI IN PAIRS RARE YEAST    Culture   Final    ABUNDANT ACINETOBACTER CALCOACETICUS/BAUMANNII COMPLEX Note: TIGECYCLINE  MIC 1 COLISTIN SENSITIVE .0125 ug/mL  ETEST results for this drug are "FOR INVESTIGATIONAL USE ONLY" and should NOT be used for clinical purposes. Performed at [facility]    Report Status 05/12/2015 FINAL  Final   Organism ID, Bacteria ACINETOBACTER CALCOACETICUS/BAUMANNII COMPLEX  Final      Susceptibility   Acinetobacter calcoaceticus/baumannii complex - MIC*    AMPICILLIN >=32 RESISTANT Resistant     AMPICILLIN/SULBACTAM 4 SENSITIVE Sensitive     CEFAZOLIN >=64 RESISTANT Resistant     CEFTAZIDIME >=64 RESISTANT Resistant     CEFTRIAXONE >=64 RESISTANT Resistant     CIPROFLOXACIN >=4 RESISTANT Resistant     GENTAMICIN <=1 SENSITIVE Sensitive     IMIPENEM 0.5 SENSITIVE Sensitive     PIP/TAZO >=128 RESISTANT Resistant     TOBRAMYCIN <=1 SENSITIVE Sensitive     TRIMETH/SULFA >=320 RESISTANT Resistant     * ABUNDANT ACINETOBACTER CALCOACETICUS/BAUMANNII COMPLEX  Culture, blood (routine x 2)     Status: None   Collection Time: 05/10/15  3:05 PM  Result Value Ref Range Status   Specimen Description Blood  Final   Special Requests Normal  Final   Culture  Setup Time TEST WILL BE CREDITED TEST CANCELLED PER RN   Final   Culture TEST WILL BE CREDITED TEST CANCELLED PER RN   Final   Report Status 05/10/2015 FINAL  Final  Culture, respiratory (NON-Expectorated)     Status: None    Collection Time: 05/12/15 12:20 AM  Result Value  Ref Range Status   Specimen Description BRONCHIAL ALVEOLAR LAVAGE  Final   Special Requests NONE  Final   Gram Stain   Final    MODERATE WBC PRESENT,BOTH PMN AND MONONUCLEAR RARE SQUAMOUS EPITHELIAL CELLS PRESENT NO ORGANISMS SEEN Performed at [facility]    Culture   Final    FEW CANDIDA ALBICANS FEW ACINETOBACTER CALCOACETICUS/BAUMANNII COMPLEX Performed at [facility]    Report Status 05/15/2015 FINAL  Final   Organism ID, Bacteria ACINETOBACTER CALCOACETICUS/BAUMANNII COMPLEX  Final      Susceptibility   Acinetobacter calcoaceticus/baumannii complex - MIC*    AMPICILLIN >=32 RESISTANT Resistant     AMPICILLIN/SULBACTAM 4 SENSITIVE Sensitive     CEFAZOLIN >=64 RESISTANT Resistant     CEFTAZIDIME >=64 RESISTANT Resistant     CEFTRIAXONE >=64 RESISTANT Resistant     CIPROFLOXACIN >=4 RESISTANT Resistant     GENTAMICIN <=1 SENSITIVE Sensitive     IMIPENEM 0.5 SENSITIVE Sensitive     PIP/TAZO >=128 RESISTANT Resistant     TOBRAMYCIN <=1 SENSITIVE Sensitive     TRIMETH/SULFA >=320 RESISTANT Resistant     * FEW ACINETOBACTER CALCOACETICUS/BAUMANNII COMPLEX  Culture, blood (routine x 2)     Status: None (Preliminary result)   Collection Time: 05/15/15  5:30 PM  Result Value Ref Range Status   Specimen Description BLOOD BLOOD LEFT HAND  Final   Special Requests BOTTLES DRAWN AEROBIC ONLY 1CC  Final   Culture NO GROWTH 4 DAYS  Final   Report Status PENDING  Incomplete  Culture, blood (routine x 2)     Status: None (Preliminary result)   Collection Time: 05/15/15  6:22 PM  Result Value Ref Range Status   Specimen Description BLOOD RIGHT FOOT  Final   Special Requests IN PEDIATRIC BOTTLE 3CC  Final   Culture NO GROWTH 4 DAYS  Final   Report Status PENDING  Incomplete  Culture, respiratory (NON-Expectorated)     Status: None (Preliminary result)   Collection Time: 05/18/15  7:46 PM  Result Value Ref Range Status     Specimen Description TRACHEAL ASPIRATE  Final   Special Requests NONE  Final   Gram Stain   Final    ABUNDANT WBC PRESENT,BOTH PMN AND MONONUCLEAR RARE SQUAMOUS EPITHELIAL CELLS PRESENT MODERATE GRAM NEGATIVE COCCOBACILLI RARE GRAM POSITIVE COCCI IN PAIRS    Culture PENDING  Incomplete   Report Status PENDING  Incomplete    Studies/Results: No results found.    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, MRSA bacteremia, microaerophlic bacteremia and now VAP with acinetobacter and persistent fevers  #1 VAP: repeat BAL sent with GPC pairs growing  --continue tygacil  #2 MRSA bacteremia and microaerophillic streptococcal bacteremia --complete 6 week course from date of first negative cultures post  line removal  #3 decubitus ulcers mx: I really have  A VERY hard time seeing how he is going to do well  Mom asked if Plastics could come by, wounds photographed yesterday  #4 FUO: suspect due to VAP and difficultly clearing secretions, will consider Cdiff testing stool if output worsens with high WBC       LOS: 24 days   [name] 05/19/2015, 7:20 PM

## 2015-05-19 NOTE — Progress Notes (Signed)
Patient refused CPT for 0000 tonight

## 2015-05-20 LAB — BASIC METABOLIC PANEL
Anion gap: 8 (ref 5–15)
BUN: 16 mg/dL (ref 6–20)
CALCIUM: 7.2 mg/dL — AB (ref 8.9–10.3)
CO2: 21 mmol/L — ABNORMAL LOW (ref 22–32)
Chloride: 112 mmol/L — ABNORMAL HIGH (ref 101–111)
Creatinine, Ser: 0.42 mg/dL — ABNORMAL LOW (ref 0.61–1.24)
GLUCOSE: 129 mg/dL — AB (ref 65–99)
POTASSIUM: 4.4 mmol/L (ref 3.5–5.1)
SODIUM: 141 mmol/L (ref 135–145)

## 2015-05-20 LAB — CULTURE, BLOOD (ROUTINE X 2)
CULTURE: NO GROWTH
Culture: NO GROWTH

## 2015-05-20 LAB — PHOSPHORUS: Phosphorus: 1.5 mg/dL — ABNORMAL LOW (ref 2.5–4.6)

## 2015-05-20 LAB — CBC
HCT: 19.8 % — ABNORMAL LOW (ref 39.0–52.0)
HEMOGLOBIN: 6.4 g/dL — AB (ref 13.0–17.0)
MCH: 29 pg (ref 26.0–34.0)
MCHC: 32.3 g/dL (ref 30.0–36.0)
MCV: 89.6 fL (ref 78.0–100.0)
Platelets: 219 10*3/uL (ref 150–400)
RBC: 2.21 MIL/uL — AB (ref 4.22–5.81)
RDW: 15.9 % — ABNORMAL HIGH (ref 11.5–15.5)
WBC: 35 10*3/uL — AB (ref 4.0–10.5)

## 2015-05-20 LAB — MAGNESIUM: Magnesium: 1.5 mg/dL — ABNORMAL LOW (ref 1.7–2.4)

## 2015-05-20 LAB — GLUCOSE, CAPILLARY
GLUCOSE-CAPILLARY: 172 mg/dL — AB (ref 65–99)
GLUCOSE-CAPILLARY: 192 mg/dL — AB (ref 65–99)
Glucose-Capillary: 172 mg/dL — ABNORMAL HIGH (ref 65–99)

## 2015-05-20 LAB — PROTIME-INR
INR: 4.44 — ABNORMAL HIGH (ref 0.00–1.49)
PROTHROMBIN TIME: 41.1 s — AB (ref 11.6–15.2)

## 2015-05-20 LAB — PREPARE RBC (CROSSMATCH)

## 2015-05-20 MED ORDER — DEXTROSE 5 % IV SOLN
2.5000 mg/kg | INTRAVENOUS | Status: DC
  Administered 2015-05-20 – 2015-05-23 (×4): 190 mg via INTRAVENOUS
  Filled 2015-05-20 (×4): qty 4.75

## 2015-05-20 MED ORDER — LINEZOLID 600 MG/300ML IV SOLN
600.0000 mg | Freq: Two times a day (BID) | INTRAVENOUS | Status: DC
  Administered 2015-05-20 – 2015-05-31 (×24): 600 mg via INTRAVENOUS
  Filled 2015-05-20 (×26): qty 300

## 2015-05-20 MED ORDER — SODIUM CHLORIDE 0.9 % IV SOLN
500.0000 mg | Freq: Four times a day (QID) | INTRAVENOUS | Status: DC
  Administered 2015-05-20 – 2015-05-23 (×13): 500 mg via INTRAVENOUS
  Filled 2015-05-20 (×16): qty 500

## 2015-05-20 MED ORDER — SODIUM CHLORIDE 0.9 % IV SOLN: Freq: Once | INTRAVENOUS | Status: DC

## 2015-05-20 NOTE — Progress Notes (Signed)
ANTIBIOTIC CONSULT NOTE - INITIAL  Pharmacy Consult for Imipenem + Linezolid + Gentamicin Indication: VAP   Assessment: 29 yo m with complicated infectious diseases course.  Patient is wheelchair bound with multiple medical problems.  He is currently being treated for MRD Acinetobacter VAP and MRSA/microaerophilic strep bacteremia/osteomyelitis with vancomycin and tigecycline.  His repeat trach culture is growing GNR, likely the same organism as before.  Patient has multiple antibiotic allergies documented, but some are not true allergies.  He has confusion associated with ertapenem and apparent anaphylaxis to penicillins but can take amoxicillin.  Upon discussion with Dr. Tommy Medal, the patient, and the patient's mother, we will switch his antibiotics to gentamicin (traditional dosing) at 2.5 mg/kg q24h due to his previous accumulation of this medication and imipenem for his VAP, and switch him to linezolid for his MRSA/strep bacteremia and osteomyelitis. Wbc 35, tmax 102, plts 219, hgb 6.4 - received 1 unit of bleed (watch carefully), SCr 0.42, UOP 1.7 ml/kg/hr yesterday.  Aztreonam 7/18 >>7/30; Resumed 7/31 > 8/3 Cipro 8/1 >> 8/2 Gentamicin (pna) 8/4 >>8/9 Tigecycline 8/3 >> 8/12 Vancomycin (bacteremia) 7/18 >> 8/12  Imipenem 8/12 >> Gentamicin 8/12 >> Linezolid 8/12 >>  7/18 blood: 2/2 MRSA, microaerophilic strep 4/09 urine: >100k E.coli (pansensitive except Cipro; sensitive to aztreonam)  7/18 MRSA PCR: positive 7/21 blood: 1/2 microaerophilic strep 8/11 blood: neg final 7/31 Trach asp: Acinetobacter (S Unasyn, gent, imi, tobra. MIC 1 for tigecycline, 0.0125 for Colistin) 8/4 Resp (lavage): acinetobacter 8/7 blood x 2: ntd 8/10 trach asp: GNR  Goal of Therapy:  Gentamicin trough level <2 mcg/ml  Eradication of infection  Plan:  Discontinue vancomycin and tigecycline Start imipenem 500 mg IV q6h Start linezolid 600 mg IV q12h Start gentamicin 190 mg (~2.5 mg/kg) IV  q24h Monitor renal function closely while on gentamicin Monitor platelets while on Linezolid F/u LOT, cultures, CBC, clinical course  Cassie L. Nicole Kindred, PharmD PGY2 Infectious Diseases Pharmacy Resident Pager: [phone] 05/20/2015 12:27 PM

## 2015-05-20 NOTE — Progress Notes (Signed)
PULMONARY / CRITICAL CARE MEDICINE   Name: Jason [name] MRN: [id number] DOB: 02-Sep-1986    ADMISSION DATE: 04/25/2015 TODAY'S DATE: 05/10/2015  REFERRING MD : TRH  CHIEF COMPLAINT: Hypotension  INITIAL PRESENTATION: 29 year old male quad with MRSA bacteremia from multiple sources. Concern for endocarditis. Wounds have been checked and followed by general surgery and ortho. Patient has history of hypotension and concern for septic shock and levophed was started and PCCM consulted. 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 Now with possible HCAP.   SIGNIFICANT EVENTS: 7/28 >> OR for debridement of osteomyelitis and abscess cx with staph. Positive MRSA and strep blood cx 7/28 >> Reconsulted for extensive post op bleeding and hypotension 7/31 >> worsening respiratory status, acidosis, hypoxemia, hypotension overnight  STUDIES: 7/28 TEE >> NO vegetation, normal LV function  7/31 Bronch >> HCAP, extensive mucous plug 8/2 bronch - no sig obstruction 8/2- family does not want trach at this point even though is recommended 8/3 bronch lungs open, clear, extubated, re intubated lat evening due to collapse 8/4- pt changed decision, trach placed 8/8 weaned 10+ hours  SUBJECTIVE/OVERNIGHT:  No acute events, still bringing up lots of secretions, no new complaints.  VITAL SIGNS: Temp:  [98.1 F (36.7 C)-102 F (38.9 C)] 99.2 F (37.3 C) (08/12 0830) Pulse Rate:  [96-112] 108 (08/12 0830) Resp:  [13-27] 27 (08/12 0830) BP: (99-115)/(59-90) 99/69 mmHg (08/12 0830) SpO2:  [99 %-100 %] 100 % (08/12 0830) FiO2 (%):  [40 %] 40 % (08/12 0803) Weight:  [75.751 kg (167 lb)] 75.751 kg (167 lb) (08/12 0637) HEMODYNAMICS:   VENTILATOR SETTINGS: Vent Mode:  [-] CPAP FiO2 (%):  [40 %] 40 % Set Rate:  [16 bmp] 16 bmp Vt Set:  [570 mL] 570 mL PEEP:  [5 cmH20] 5 cmH20 Pressure Support:  [16 cmH20] 16 cmH20 Plateau Pressure:  [25 [id number] cmH20] 25  cmH20 INTAKE / OUTPUT:  Intake/Output Summary (Last 24 hours) at 05/20/15 1129 Last data filed at 05/20/15 1000  Gross per 24 hour  Intake 1428.83 ml  Output   6850 ml  Net -5421.17 ml    PHYSICAL EXAMINATION:  Gen: chronically ill appearing, sleepy on vent HENT: trach clean, dry PULM: vent supported breaths, loud rhonchi CV: RRR, no mgr, edema arms, feet GI: BS+, soft, nontender Derm:  sacral wound stage 4 plus Neuro:  Sleepy but arouses to touch    LABS:  CBC  Recent Labs Lab 05/16/15 0534 05/18/15 0251 05/20/15 0244  WBC 20.1* 35.4* 35.0*  HGB 7.9* 7.0* 6.4*  HCT 23.4* 21.3* 19.8*  PLT 260 206 219   Coag's  Recent Labs Lab 05/20/15 0244  INR 4.44*   BMET  Recent Labs Lab 05/17/15 0450 05/18/15 0251 05/20/15 0244  NA 141 143 141  K 3.9 4.2 4.4  CL 114* 116* 112*  CO2 22 22 21*  BUN 15 17 16   CREATININE 0.38* 0.40* 0.42*  GLUCOSE 148* 229* 129*   Electrolytes  Recent Labs Lab 05/14/15 0600  05/17/15 0450 05/18/15 0251 05/19/15 0430 05/20/15 0244  CALCIUM 7.3*  < > 7.0* 7.1*  --  7.2*  MG 1.6*  < > 1.3* 1.8 1.7 1.5*  PHOS 3.2  --   --   --   --  1.5*  < > = values in this interval not displayed. Sepsis Markers No results for input(s): LATICACIDVEN, PROCALCITON, O2SATVEN in the last 168 hours. ABG No results for input(s): PHART, PCO2ART,  PO2ART in the last 168 hours. Liver Enzymes No results for input(s): AST, ALT, ALKPHOS, BILITOT, ALBUMIN in the last 168 hours. Cardiac Enzymes No results for input(s): TROPONINI, PROBNP in the last 168 hours. Glucose  Recent Labs Lab 05/19/15 0813 05/19/15 1246 05/19/15 1534 05/19/15 2029 05/20/15 0022 05/20/15 0500  GLUCAP 260* 283* 232* 176* 172* 172*   Imaging I reviewed CXR myself, multi-lobar PNA noted.  Discussed with RT and bedside RN.  ASSESSMENT / PLAN:  PULMONARY A:  Acute hypoxemia respiratory failure > due to pulm edema vs HCAP with atelectasis  Pleural effusion R base,  small Collapse rt base RML, RLL< improved post bronch- reintubated Trach 8/5 >> P:  PS as able but do not suspect liberation from vent. Chest PT. In-line PMV - will likely be prolonged wean. Remove trach sutures today.  CARDIOVASCULAR Right Arm PICC 7/19>>> A:  Shock resolved Relative adrenal insufficiency P:  Observe off steroids Continue Midodrine 20mg  PO TID  RENAL A:  Hypomag Hypokalemia > improving P:  Monitor BMET and UOP intermittently. Replace electrolytes as needed  GASTROINTESTINAL OGT A:  Severe malnutrition P:  Zofran PRN nausea PPI  Cont TF IR consulted for g-tube placement.  HEMATOLOGIC A:  Anemia - due to blood loss from sacral wound and severe peri op bleeding from debridement, dilution DVT prevention Persistent leukocytosis May 2016 PE IVC filter placed 8/6 P:  Trend CBC in am  SCD Monitor for bleeding Transfuse 1 unit pRBC 8/12  INFECTIOUS A:  MRSA bacteremia, neg TEE, likely osteomyelitis source E coli UTI  New HCAP likely Once finish abx course, need to remove all lines  MDR acinatobacter P:  BCx2 7/18 >>  MRSA BCx2 7/21 >>MRSA, strep Wound/tissue culture 7/28 >> Abscess culture 7/28 >> Bronch bal 7/31 >> MDR acint  BAL 8/4>>>few GNR>>>  Vancomycin 7/18>>> Aztreonam 7/18>>>8/3 8/1 cipro>>>8/3 8/3 gent >>>8/7 8/3 tygacyclin>>>   ID following   ENDOCRINE A:  Possible adrenal insufficiency  T1 DM hypoglycemia P:  SSI Observe off steroids    NEUROLOGIC A: Quad post trauma Post op pain  Acute encephalopathy 7/31 > improved P:  RASS goal 0 Continue fent patch & fent prn remeron qhs  Baclofen, lyrica   Continue daily PSV weaning, G-tube to be inserted by IR (already saw the patient and CT is ordered), mother and patient ok with that, order in, arranging home with home vent.  Transfuse as above.  Rush Farmer, M.D. [facility] Pulmonary/Critical Care Medicine. Pager: [phone]. After  hours pager: [phone].

## 2015-05-20 NOTE — Progress Notes (Signed)
CRITICAL VALUE ALERT  Critical value received:  Hemoglobin 6.4  Date of notification:  05/20/2015   Time of notification:  0345  Critical value read back: yes   Nurse who received alert:  Reatha Armour RN  MD notified (1st page):    Time of first page:  0400  MD notified (2nd page):  Time of second page:  Responding MD:  Dr. Mortimer Fries  Time MD responded:  0400

## 2015-05-20 NOTE — Progress Notes (Signed)
Patient refused 20:00 CPT. Mother at bedside. Asking for pain meds. Vitals stable. SPO2 100%

## 2015-05-20 NOTE — Progress Notes (Signed)
[place] [facility]    Subjective:  Pt without new complaints other than fevers and    Antibiotics:  Anti-infectives    Start     Dose/Rate Route Frequency Ordered Stop   05/20/15 1300  linezolid (ZYVOX) IVPB 600 mg     600 mg 300 mL/hr over 60 Minutes Intravenous Every 12 hours 05/20/15 1218     05/20/15 1300  imipenem-cilastatin (PRIMAXIN) 500 mg in sodium chloride 0.9 % 100 mL IVPB     500 mg 200 mL/hr over 30 Minutes Intravenous 4 times per day 05/20/15 1218     05/20/15 1300  gentamicin (GARAMYCIN) 190 mg in dextrose 5 % 50 mL IVPB     2.5 mg/kg  75.8 kg 109.5 mL/hr over 30 Minutes Intravenous Every 24 hours 05/20/15 1218     05/18/15 0030  vancomycin (VANCOCIN) IVPB 1000 mg/200 mL premix  Status:  Discontinued     1,000 mg 200 mL/hr over 60 Minutes Intravenous Every 24 hours 05/18/15 0017 05/20/15 1218   05/14/15 0029  vancomycin (VANCOCIN) IVPB 750 mg/150 ml premix  Status:  Discontinued     750 mg 150 mL/hr over 60 Minutes Intravenous Every 24 hours 05/14/15 0029 05/18/15 0017   05/13/15 1000  gentamicin (GARAMYCIN) 520 mg in dextrose 5 % 100 mL IVPB  Status:  Discontinued     7 mg/kg  74.5 kg 113 mL/hr over 60 Minutes Intravenous Every 48 hours 05/12/15 0044 05/17/15 1403   05/12/15 0000  tigecycline (TYGACIL) 50 mg in sodium chloride 0.9 % 100 mL IVPB  Status:  Discontinued     50 mg 200 mL/hr over 30 Minutes Intravenous Every 12 hours 05/11/15 0932 05/20/15 1218   05/11/15 1200  tigecycline (TYGACIL) 100 mg in sodium chloride 0.9 % 100 mL IVPB     100 mg 200 mL/hr over 30 Minutes Intravenous  Once 05/11/15 0932 05/11/15 1443   05/11/15 1015  gentamicin (GARAMYCIN) 520 mg in dextrose 5 % 100 mL IVPB  Status:  Discontinued     7 mg/kg  74.5 kg 113 mL/hr over 60 Minutes Intravenous Every 24 hours 05/11/15 1000 05/12/15 0043   05/09/15 0930  ciprofloxacin (CIPRO) IVPB 400 mg  Status:  Discontinued     400 mg 200 mL/hr over 60 Minutes  Intravenous Every 12 hours 05/09/15 0904 05/11/15 0823   05/08/15 0900  aztreonam (AZACTAM) 2 g in dextrose 5 % 50 mL IVPB  Status:  Discontinued     2 g 100 mL/hr over 30 Minutes Intravenous 3 times per day 05/08/15 0826 05/11/15 0823   05/06/15 0000  vancomycin (VANCOCIN) IVPB 750 mg/150 ml premix  Status:  Discontinued     750 mg 150 mL/hr over 60 Minutes Intravenous Every 48 hours 05/05/15 2357 05/14/15 0030   04/28/15 2000  vancomycin (VANCOCIN) IVPB 750 mg/150 ml premix  Status:  Discontinued     750 mg 150 mL/hr over 60 Minutes Intravenous Every 24 hours 04/28/15 0731 05/04/15 1932   04/26/15 0800  vancomycin (VANCOCIN) IVPB 750 mg/150 ml premix  Status:  Discontinued     750 mg 150 mL/hr over 60 Minutes Intravenous Every 12 hours 04/25/15 1715 04/28/15 0731   04/26/15 0200  aztreonam (AZACTAM) 1 g in dextrose 5 % 50 mL IVPB  Status:  Discontinued     1 g 100 mL/hr over 30 Minutes Intravenous Every 8 hours 04/25/15 1919 05/07/15 1035   04/25/15 1715  vancomycin (  VANCOCIN) IVPB 750 mg/150 ml premix     750 mg 150 mL/hr over 60 Minutes Intravenous STAT 04/25/15 1715 04/25/15 1906   04/25/15 1645  aztreonam (AZACTAM) 2 g in dextrose 5 % 50 mL IVPB     2 g 100 mL/hr over 30 Minutes Intravenous  Once 04/25/15 1643 04/25/15 1807      Medications: Scheduled Meds: . sodium chloride   Intravenous Once  . albuterol  2.5 mg Nebulization Q4H  . antiseptic oral rinse  7 mL Mouth Rinse QID  . baclofen  10 mg Oral BID WC  . baclofen  20 mg Oral QHS  . chlorhexidine gluconate  15 mL Mouth Rinse BID  . fentaNYL  50 mcg Transdermal Q72H  . gentamicin  2.5 mg/kg Intravenous Q24H  . imipenem-cilastatin  500 mg Intravenous 4 times per day  . insulin aspart  0-15 Units Subcutaneous 6 times per day  . linezolid  600 mg Intravenous Q12H  . loratadine  10 mg Oral QHS  . midodrine  20 mg Oral TID WC  . mirtazapine  7.5 mg Oral QHS  . pantoprazole sodium  40 mg Per Tube Daily  . pregabalin  100  mg Oral TID AC  . pregabalin  200 mg Oral QHS  . sodium chloride  3 mL Intracatheter Q12H   Continuous Infusions: . sodium chloride 10 mL/hr at 05/17/15 0016  . feeding supplement (VITAL AF 1.2 CAL) 1,000 mL (05/19/15 2131)   PRN Meds:.acetaminophen **OR** acetaminophen, albuterol, albuterol, fentaNYL (SUBLIMAZE) injection, midazolam, ondansetron, oxyCODONE, sodium chloride    Objective: Weight change:   Intake/Output Summary (Last 24 hours) at 05/20/15 1853 Last data filed at 05/20/15 1731  Gross per 24 hour  Intake 2629.75 ml  Output   4275 ml  Net -1645.25 ml   Blood pressure 120/90, pulse 110, temperature 98.6 F (37 C), temperature source Oral, resp. rate 12, height 5\' 8"  (1.727 m), weight 167 lb (75.751 kg), SpO2 100 %. Temp:  [98.6 F (37 C)-101.3 F (38.5 C)] 98.6 F (37 C) (08/12 1730) Pulse Rate:  [96-112] 110 (08/12 1730) Resp:  [12-27] 12 (08/12 1730) BP: (99-131)/(59-90) 120/90 mmHg (08/12 1730) SpO2:  [100 %] 100 % (08/12 1730) FiO2 (%):  [40 %] 40 % (08/12 1730) Weight:  [167 lb (75.751 kg)] 167 lb (75.751 kg) (08/12 1017)  Physical Exam: General: Alert and awake, oriented  HEENT: anicteric sclera, pupils reactive to light and accommodation, EOMI, tracheostomy CVS tachy rate, normal r,  no murmur rubs or gallops Chest rhonchi throughout Abdomen: soft , nondistended, ostomy bag with signficant stool, urine clean  Neuro: quadriplegic  Skin: not examined thoroughly today see prior note  CBC: CBC Latest Ref Rng 05/20/2015 05/18/2015 05/16/2015  WBC 4.0 - 10.5 K/uL 35.0(H) 35.4(H) 20.1(H)  Hemoglobin 13.0 - 17.0 g/dL 6.4(LL) 7.0(L) 7.9(L)  Hematocrit 39.0 - 52.0 % 19.8(L) 21.3(L) 23.4(L)  Platelets 150 - 400 K/uL 219 206 260     BMET  Recent Labs  05/18/15 0251 05/20/15 0244  NA 143 141  K 4.2 4.4  CL 116* 112*  CO2 22 21*  GLUCOSE 229* 129*  BUN 17 16  CREATININE 0.40* 0.42*  CALCIUM 7.1* 7.2*     Liver Panel  No results for input(s):  PROT, ALBUMIN, AST, ALT, ALKPHOS, BILITOT, BILIDIR, IBILI in the last 72 hours.     Sedimentation Rate No results for input(s): ESRSEDRATE in the last 72 hours. C-Reactive Protein No results for input(s): CRP in the last  72 hours.  Micro Results: Recent Results (from the past 720 hour(s))  Urine culture     Status: None   Collection Time: 04/25/15  4:35 PM  Result Value Ref Range Status   Specimen Description URINE, CLEAN CATCH  Final   Special Requests Normal  Final   Culture   Final    >=100,000 COLONIES/mL ESCHERICHIA COLI AZTREONAM SENSITIVE ZONE 32MM Performed at [facility] Performed at [facility]    Report Status 05/02/2015 FINAL  Final   Organism ID, Bacteria ESCHERICHIA COLI  Final      Susceptibility   Escherichia coli - MIC*    AMPICILLIN <=2 SENSITIVE Sensitive     CEFAZOLIN <=4 SENSITIVE Sensitive     CEFTRIAXONE <=1 SENSITIVE Sensitive     CIPROFLOXACIN >=4 RESISTANT Resistant     GENTAMICIN <=1 SENSITIVE Sensitive     IMIPENEM <=0.25 SENSITIVE Sensitive     NITROFURANTOIN <=16 SENSITIVE Sensitive     TRIMETH/SULFA <=20 SENSITIVE Sensitive     AMPICILLIN/SULBACTAM <=2 SENSITIVE Sensitive     PIP/TAZO <=4 SENSITIVE Sensitive     * >=100,000 COLONIES/mL ESCHERICHIA COLI  Blood culture (routine x 2)     Status: None   Collection Time: 04/25/15  5:00 PM  Result Value Ref Range Status   Specimen Description BLOOD RIGHT ANTECUBITAL  Final   Special Requests BOTTLES DRAWN AEROBIC AND ANAEROBIC 5 CC EA  Final   Culture  Setup Time   Final    GRAM POSITIVE COCCI IN CLUSTERS Gram Stain Report Called to,Read Back By and Verified With: Elveria Rising RN 17:30 04/26/15 ([email or web address]) IN BOTH AEROBIC AND ANAEROBIC BOTTLES    Culture   Final    METHICILLIN RESISTANT STAPHYLOCOCCUS AUREUS MICROAEROPHILIC STREPTOCOCCI Standardized susceptibility testing for this organism is not available. Performed at [facility]    Report Status 04/30/2015 FINAL   Final   Organism ID, Bacteria METHICILLIN RESISTANT STAPHYLOCOCCUS AUREUS  Final      Susceptibility   Methicillin resistant staphylococcus aureus - MIC*    CIPROFLOXACIN >=8 RESISTANT Resistant     ERYTHROMYCIN >=8 RESISTANT Resistant     GENTAMICIN <=0.5 SENSITIVE Sensitive     OXACILLIN >=4 RESISTANT Resistant     TETRACYCLINE <=1 SENSITIVE Sensitive     VANCOMYCIN <=0.5 SENSITIVE Sensitive     TRIMETH/SULFA >=320 RESISTANT Resistant     CLINDAMYCIN >=8 RESISTANT Resistant     RIFAMPIN <=0.5 SENSITIVE Sensitive     Inducible Clindamycin NEGATIVE Sensitive     * METHICILLIN RESISTANT STAPHYLOCOCCUS AUREUS  Blood culture (routine x 2)     Status: None   Collection Time: 04/25/15  5:16 PM  Result Value Ref Range Status   Specimen Description BLOOD BLOOD RIGHT FOREARM  Final   Special Requests BOTTLES DRAWN AEROBIC AND ANAEROBIC 5 CC EA  Final   Culture  Setup Time   Final    GRAM POSITIVE COCCI IN CLUSTERS CRITICAL RESULT CALLED TO, READ BACK BY AND VERIFIED WITH: S GRAHAM 04/26/15 @ 26 M VESTAL IN BOTH AEROBIC AND ANAEROBIC BOTTLES    Culture   Final    METHICILLIN RESISTANT STAPHYLOCOCCUS AUREUS SUSCEPTIBILITIES PERFORMED ON PREVIOUS CULTURE WITHIN THE LAST 5 DAYS. MICROAEROPHILIC STREPTOCOCCI Standardized susceptibility testing for this organism is not available. Performed at [facility]    Report Status 05/03/2015 FINAL  Final  MRSA PCR Screening     Status: Abnormal   Collection Time: 04/25/15  6:55 PM  Result Value Ref Range Status  MRSA by PCR POSITIVE (A) NEGATIVE Final    Comment:        The GeneXpert MRSA Assay (FDA approved for NASAL specimens only), is one component of a comprehensive MRSA colonization surveillance program. It is not intended to diagnose MRSA infection nor to guide or monitor treatment for MRSA infections. RESULT CALLED TO, READ BACK BY AND VERIFIED WITH: Sharyn Lull REEVES,RN [id number] @ 2043 BY J SCOTTON   Culture, blood (routine x  2)     Status: None   Collection Time: 04/28/15  1:20 PM  Result Value Ref Range Status   Specimen Description BLOOD LEFT ARM  Final   Special Requests IN PEDIATRIC BOTTLE 1CC  Final   Culture  Setup Time   Final    GRAM POSITIVE COCCI IN CLUSTERS AEROBIC BOTTLE ONLY CRITICAL RESULT CALLED TO, READ BACK BY AND VERIFIED WITH: Hetty Blend RN 2222 04/30/15 A BROWNING    Culture   Final    MICROAEROPHILIC STREPTOCOCCI Standardized susceptibility testing for this organism is not available. Performed at [facility]    Report Status 05/03/2015 FINAL  Final  Culture, blood (routine x 2)     Status: None   Collection Time: 04/28/15  5:50 PM  Result Value Ref Range Status   Specimen Description BLOOD PICC LINE  Final   Special Requests BOTTLES DRAWN AEROBIC AND ANAEROBIC 5CC EACH  Final   Culture   Final    NO GROWTH 5 DAYS Performed at [facility]    Report Status 05/03/2015 FINAL  Final  Culture, blood (routine x 2)     Status: None   Collection Time: 05/04/15  6:30 PM  Result Value Ref Range Status   Specimen Description BLOOD PICC LINE  Final   Special Requests BOTTLES DRAWN AEROBIC AND ANAEROBIC 5ML  Final   Culture   Final    NO GROWTH 5 DAYS Performed at [facility]    Report Status 05/09/2015 FINAL  Final  Culture, blood (routine x 2)     Status: None   Collection Time: 05/04/15  6:30 PM  Result Value Ref Range Status   Specimen Description BLOOD PICC LINE  Final   Special Requests BOTTLES DRAWN AEROBIC AND ANAEROBIC 5ML  Final   Culture   Final    NO GROWTH 5 DAYS Performed at [facility]    Report Status 05/09/2015 FINAL  Final  Anaerobic culture     Status: None   Collection Time: 05/05/15  1:01 PM  Result Value Ref Range Status   Specimen Description ABSCESS  Final   Special Requests SPEC A ON SWAB RIGHT PELVIC ABSCESS  Final   Gram Stain   Final    FEW WBC PRESENT, PREDOMINANTLY MONONUCLEAR NO SQUAMOUS EPITHELIAL CELLS SEEN FEW  GRAM POSITIVE COCCI IN PAIRS IN CLUSTERS Performed at [facility]    Culture   Final    NO ANAEROBES ISOLATED Performed at [facility]    Report Status 05/10/2015 FINAL  Final  Culture, routine-abscess     Status: None   Collection Time: 05/05/15  1:01 PM  Result Value Ref Range Status   Specimen Description ABSCESS  Final   Special Requests SPEC A ON SWAB RIGHT PELVIC ABSCESS  Final   Gram Stain   Final    FEW WBC PRESENT, PREDOMINANTLY MONONUCLEAR NO SQUAMOUS EPITHELIAL CELLS SEEN FEW GRAM POSITIVE COCCI IN PAIRS IN CLUSTERS Performed at [employer] [facility]  Final    MODERATE METHICILLIN RESISTANT STAPHYLOCOCCUS AUREUS Note: RIFAMPIN AND GENTAMICIN SHOULD NOT BE USED AS SINGLE DRUGS FOR TREATMENT OF STAPH INFECTIONS. CRITICAL RESULT CALLED TO, READ BACK BY AND VERIFIED WITH: IREKIA B. 8/1 @216  BY REAMM Performed at [facility]    Report Status 05/09/2015 FINAL  Final   Organism ID, Bacteria METHICILLIN RESISTANT STAPHYLOCOCCUS AUREUS  Final      Susceptibility   Methicillin resistant staphylococcus aureus - MIC*    CLINDAMYCIN >=8 RESISTANT Resistant     ERYTHROMYCIN >=8 RESISTANT Resistant     GENTAMICIN <=0.5 SENSITIVE Sensitive     LEVOFLOXACIN >=8 RESISTANT Resistant     OXACILLIN >=4 RESISTANT Resistant     RIFAMPIN <=0.5 SENSITIVE Sensitive     TRIMETH/SULFA >=320 RESISTANT Resistant     VANCOMYCIN 1 SENSITIVE Sensitive     TETRACYCLINE <=1 SENSITIVE Sensitive     * MODERATE METHICILLIN RESISTANT STAPHYLOCOCCUS AUREUS  Tissue culture     Status: None   Collection Time: 05/05/15  1:13 PM  Result Value Ref Range Status   Specimen Description TISSUE  Final   Special Requests SPEC B IN CUP BONE RIGHT ISCHIUM  Final   Gram Stain   Final    RARE WBC PRESENT, PREDOMINANTLY MONONUCLEAR NO ORGANISMS SEEN Performed at [facility]    Culture   Final    FEW METHICILLIN RESISTANT STAPHYLOCOCCUS AUREUS Note: RIFAMPIN AND  GENTAMICIN SHOULD NOT BE USED AS SINGLE DRUGS FOR TREATMENT OF STAPH INFECTIONS. CRITICAL RESULT CALLED TO, READ BACK BY AND VERIFIED WITH: IREKIA B. 8/1 @216  BY REAMM Performed at [facility]    Report Status 05/09/2015 FINAL  Final   Organism ID, Bacteria METHICILLIN RESISTANT STAPHYLOCOCCUS AUREUS  Final      Susceptibility   Methicillin resistant staphylococcus aureus - MIC*    CLINDAMYCIN >=8 RESISTANT Resistant     ERYTHROMYCIN >=8 RESISTANT Resistant     GENTAMICIN <=0.5 SENSITIVE Sensitive     LEVOFLOXACIN >=8 RESISTANT Resistant     OXACILLIN >=4 RESISTANT Resistant     RIFAMPIN <=0.5 SENSITIVE Sensitive     TRIMETH/SULFA >=320 RESISTANT Resistant     VANCOMYCIN 1 SENSITIVE Sensitive     TETRACYCLINE <=1 SENSITIVE Sensitive     * FEW METHICILLIN RESISTANT STAPHYLOCOCCUS AUREUS  Culture, respiratory (NON-Expectorated)     Status: None   Collection Time: 05/08/15  8:36 AM  Result Value Ref Range Status   Specimen Description TRACHEAL ASPIRATE  Final   Special Requests Normal  Final   Gram Stain   Final    RARE WBC PRESENT, PREDOMINANTLY PMN NO SQUAMOUS EPITHELIAL CELLS SEEN FEW GRAM NEGATIVE RODS RARE GRAM POSITIVE COCCI IN PAIRS RARE YEAST    Culture   Final    ABUNDANT ACINETOBACTER CALCOACETICUS/BAUMANNII COMPLEX Note: TIGECYCLINE  MIC 1 COLISTIN SENSITIVE .0125 ug/mL  ETEST results for this drug are "FOR INVESTIGATIONAL USE ONLY" and should NOT be used for clinical purposes. Performed at [facility]    Report Status 05/12/2015 FINAL  Final   Organism ID, Bacteria ACINETOBACTER CALCOACETICUS/BAUMANNII COMPLEX  Final      Susceptibility   Acinetobacter calcoaceticus/baumannii complex - MIC*    AMPICILLIN >=32 RESISTANT Resistant     AMPICILLIN/SULBACTAM 4 SENSITIVE Sensitive     CEFAZOLIN >=64 RESISTANT Resistant     CEFTAZIDIME >=64 RESISTANT Resistant     CEFTRIAXONE >=64 RESISTANT Resistant     CIPROFLOXACIN >=4 RESISTANT Resistant      GENTAMICIN <=1 SENSITIVE  Sensitive     IMIPENEM 0.5 SENSITIVE Sensitive     PIP/TAZO >=128 RESISTANT Resistant     TOBRAMYCIN <=1 SENSITIVE Sensitive     TRIMETH/SULFA >=320 RESISTANT Resistant     * ABUNDANT ACINETOBACTER CALCOACETICUS/BAUMANNII COMPLEX  Culture, blood (routine x 2)     Status: None   Collection Time: 05/10/15  3:05 PM  Result Value Ref Range Status   Specimen Description Blood  Final   Special Requests Normal  Final   Culture  Setup Time TEST WILL BE CREDITED TEST CANCELLED PER RN   Final   Culture TEST WILL BE CREDITED TEST CANCELLED PER RN   Final   Report Status 05/10/2015 FINAL  Final  Culture, respiratory (NON-Expectorated)     Status: None   Collection Time: 05/12/15 12:20 AM  Result Value Ref Range Status   Specimen Description BRONCHIAL ALVEOLAR LAVAGE  Final   Special Requests NONE  Final   Gram Stain   Final    MODERATE WBC PRESENT,BOTH PMN AND MONONUCLEAR RARE SQUAMOUS EPITHELIAL CELLS PRESENT NO ORGANISMS SEEN Performed at [facility]    Culture   Final    FEW CANDIDA ALBICANS FEW ACINETOBACTER CALCOACETICUS/BAUMANNII COMPLEX Performed at [facility]    Report Status 05/15/2015 FINAL  Final   Organism ID, Bacteria ACINETOBACTER CALCOACETICUS/BAUMANNII COMPLEX  Final      Susceptibility   Acinetobacter calcoaceticus/baumannii complex - MIC*    AMPICILLIN >=32 RESISTANT Resistant     AMPICILLIN/SULBACTAM 4 SENSITIVE Sensitive     CEFAZOLIN >=64 RESISTANT Resistant     CEFTAZIDIME >=64 RESISTANT Resistant     CEFTRIAXONE >=64 RESISTANT Resistant     CIPROFLOXACIN >=4 RESISTANT Resistant     GENTAMICIN <=1 SENSITIVE Sensitive     IMIPENEM 0.5 SENSITIVE Sensitive     PIP/TAZO >=128 RESISTANT Resistant     TOBRAMYCIN <=1 SENSITIVE Sensitive     TRIMETH/SULFA >=320 RESISTANT Resistant     * FEW ACINETOBACTER CALCOACETICUS/BAUMANNII COMPLEX  Culture, blood (routine x 2)     Status: None   Collection Time: 05/15/15  5:30 PM    Result Value Ref Range Status   Specimen Description BLOOD BLOOD LEFT HAND  Final   Special Requests BOTTLES DRAWN AEROBIC ONLY 1CC  Final   Culture NO GROWTH 5 DAYS  Final   Report Status 05/20/2015 FINAL  Final  Culture, blood (routine x 2)     Status: None   Collection Time: 05/15/15  6:22 PM  Result Value Ref Range Status   Specimen Description BLOOD RIGHT FOOT  Final   Special Requests IN PEDIATRIC BOTTLE 3CC  Final   Culture NO GROWTH 5 DAYS  Final   Report Status 05/20/2015 FINAL  Final  Culture, respiratory (NON-Expectorated)     Status: None (Preliminary result)   Collection Time: 05/18/15  7:46 PM  Result Value Ref Range Status   Specimen Description TRACHEAL ASPIRATE  Final   Special Requests NONE  Final   Gram Stain   Final    ABUNDANT WBC PRESENT,BOTH PMN AND MONONUCLEAR RARE SQUAMOUS EPITHELIAL CELLS PRESENT MODERATE GRAM NEGATIVE COCCOBACILLI RARE GRAM POSITIVE COCCI IN PAIRS    Culture   Final    ABUNDANT GRAM NEGATIVE RODS Performed at [facility]    Report Status PENDING  Incomplete    Studies/Results: No results found.    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, MRSA bacteremia, microaerophlic bacteremia and now VAP with  acinetobacter and persistent fevers  #1 VAP: repeat BAL sent with abundant GNR growing  Will switch to IMIPENEM and GENT and hopefully no allergic rxn to carbapenem  #2 MRSA bacteremia and microaerophillic streptococcal bacteremia Switch to ZYVOX for now then back to vancomycin if and when we can clear his acinetoabacter PNA to complete  6 week course from date of first negative cultures post line removal  #3 decubitus ulcers mx: I really have  A VERY hard time seeing how he is going to do well  Mom asked if Plastics could come by, wounds photographed yesterday  #4 FUO: suspect due to VAP and difficultly clearing secretions, will consider Cdiff testing stool if output worsens with high WBC and        LOS: 25 days   [name] 05/20/2015, 6:53 PM

## 2015-05-20 NOTE — Care Management Note (Signed)
Case Management Note  Patient Details  Name: Jason [name] MRN: [id number] Date of Birth: [birth year]  Subjective/Objective:    Pt will discharge home on vent after PEG placed and family trained on vent.  Mother requests [facility] train and provide home vent - CM will meet with them re planning.                                    Expected Discharge Plan:  [place]  Discharge planning Services  CM Consult  Post Acute Care Choice:  Home Health Choice offered to:  Parent  DME Arranged:  Trach supplies, Ventilator, Oxygen DME Agency:  [facility]  HH Arranged:  RN [place] Agency:  [place]  Status of Service:  In process, will continue to follow  Additional Comments:  Girard Cooter, RN 05/20/2015, 8:47 AM

## 2015-05-20 NOTE — Progress Notes (Signed)
Speech Language Pathology Treatment: Jason [name] Speaking valve  Patient Details Name: Jason [name] MRN: [id number] DOB: [birth year] Today's Date: 05/20/2015 Time: 4235-3614 SLP Time Calculation (min) (ACUTE ONLY): 26 min  Assessment / Plan / Recommendation Clinical Impression  Pt seen with RT for inline PMSV placement. Pt in [place] with cuff deflated and PEEP reduced to zero for donning of valve. His VS remained stable for 20 minutes with valve in place, achieving good phonation that is rough in quality. SLP provided coaching for increased coordination of phonation during exhalation to increase intelligibility and pt comfort. Today pt is asking about when he will be able to eat and drink - MD may wish to consider FEES prior to pt discharge.   HPI Other Pertinent Information: 29 year old male quad with MRSA bacteremia from multiple sources. Concern for endocarditis. Wounds have been checked and followed by general surgery and ortho. Patient has history of hypotension and concern for septic shock and levophed was started and PCCM consulted. 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 Now with possible HCAP. Trach placed 05/12/15.   Pertinent Vitals Pain Assessment: No/denies pain  SLP Plan  Continue with current plan of care    Recommendations     Patient may use Passy-Muir Speech Valve: with SLP only PMSV Supervision: Full       Follow up Recommendations: Home health SLP;24 hour supervision/assistance Plan: Continue with current plan of care    Jason [name], M.A. CCC-SLP [phone]  Jason [name] 05/20/2015, 11:12 AM

## 2015-05-20 NOTE — Progress Notes (Signed)
Patient refused CPT for 0400.

## 2015-05-20 NOTE — Progress Notes (Deleted)
PULMONARY / CRITICAL CARE MEDICINE   Name: Jason [name] MRN: [id number] DOB: 1986-05-29    ADMISSION DATE: 04/25/2015 TODAY'S DATE: 05/10/2015  REFERRING MD : TRH  CHIEF COMPLAINT: Hypotension  INITIAL PRESENTATION: 29 year old male quad with MRSA bacteremia from multiple sources. Concern for endocarditis. Wounds have been checked and followed by general surgery and ortho. Patient has history of hypotension and concern for septic shock and levophed was started and PCCM consulted. 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 Now with possible HCAP.   SIGNIFICANT EVENTS: 7/28 >> OR for debridement of osteomyelitis and abscess cx with staph. Positive MRSA and strep blood cx 7/28 >> Reconsulted for extensive post op bleeding and hypotension 7/31 >> worsening respiratory status, acidosis, hypoxemia, hypotension overnight  STUDIES: 7/28 TEE >> NO vegetation, normal LV function  7/31 Bronch >> HCAP, extensive mucous plug 8/2 bronch - no sig obstruction 8/2- family does not want trach at this point even though is recommended 8/3 bronch lungs open, clear, extubated, re intubated lat evening due to collapse 8/4- pt changed decision, trach placed 8/8 weaned 10+ hours 8/12 planning for dc home with vent in future.  SUBJECTIVE/OVERNIGHT: NAD  VITAL SIGNS: Temp:  [98.1 F (36.7 C)-102 F (38.9 C)] 99.2 F (37.3 C) (08/12 0830) Pulse Rate:  [96-112] 108 (08/12 0830) Resp:  [13-27] 27 (08/12 0830) BP: (99-115)/(59-90) 99/69 mmHg (08/12 0830) SpO2:  [99 %-100 %] 100 % (08/12 0830) FiO2 (%):  [40 %] 40 % (08/12 0803) Weight:  [167 lb (75.751 kg)] 167 lb (75.751 kg) (08/12 0637) HEMODYNAMICS:   VENTILATOR SETTINGS: Vent Mode:  [-] CPAP FiO2 (%):  [40 %] 40 % Set Rate:  [16 bmp] 16 bmp Vt Set:  [570 mL] 570 mL PEEP:  [5 cmH20] 5 cmH20 Pressure Support:  [16 cmH20] 16 cmH20 Plateau Pressure:  [25 XFG18-29 cmH20] 25 cmH20 INTAKE /  OUTPUT:  Intake/Output Summary (Last 24 hours) at 05/20/15 1030 Last data filed at 05/20/15 9371  Gross per 24 hour  Intake 1148.83 ml  Output   6850 ml  Net -5701.17 ml    PHYSICAL EXAMINATION:  Gen: chronically ill appearing, awake and interactive HENT: trach clean, dry, sutures in trach PULM: vent supported breaths, loud rhonchi bilat CV: RRR, no mgr, edema arms, feet GI: BS+, soft, nontender Derm:  sacral wound stage 4 plus Neuro: Quad but interactive    LABS:  CBC  Recent Labs Lab 05/16/15 0534 05/18/15 0251 05/20/15 0244  WBC 20.1* 35.4* 35.0*  HGB 7.9* 7.0* 6.4*  HCT 23.4* 21.3* 19.8*  PLT 260 206 219   Coag's  Recent Labs Lab 05/20/15 0244  INR 4.44*   BMET  Recent Labs Lab 05/17/15 0450 05/18/15 0251 05/20/15 0244  NA 141 143 141  K 3.9 4.2 4.4  CL 114* 116* 112*  CO2 22 22 21*  BUN 15 17 16   CREATININE 0.38* 0.40* 0.42*  GLUCOSE 148* 229* 129*   Electrolytes  Recent Labs Lab 05/14/15 0600  05/17/15 0450 05/18/15 0251 05/19/15 0430 05/20/15 0244  CALCIUM 7.3*  < > 7.0* 7.1*  --  7.2*  MG 1.6*  < > 1.3* 1.8 1.7 1.5*  PHOS 3.2  --   --   --   --  1.5*  < > = values in this interval not displayed. Sepsis Markers No results for input(s): LATICACIDVEN, PROCALCITON, O2SATVEN in the last 168 hours. ABG No results for input(s): PHART, PCO2ART, PO2ART in  the last 168 hours. Liver Enzymes No results for input(s): AST, ALT, ALKPHOS, BILITOT, ALBUMIN in the last 168 hours. Cardiac Enzymes No results for input(s): TROPONINI, PROBNP in the last 168 hours. Glucose  Recent Labs Lab 05/19/15 0813 05/19/15 1246 05/19/15 1534 05/19/15 2029 05/20/15 0022 05/20/15 0500  GLUCAP 260* 283* 232* 176* 172* 172*   Imaging No results found.   ASSESSMENT / PLAN:  PULMONARY A:  Acute hypoxemia respiratory failure > due to pulm edema vs HCAP with atelectasis  Pleural effusion R base, small Collapse rt base RML, RLL< improved post bronch-  reintubated Trach 8/5 >> P:  PS as able but do not suspect liberation from vent. Chest PT. In-line PMV - will likely be prolonged wean. Dc sutures at 10 days 7/10  CARDIOVASCULAR Right Arm PICC 7/19>>> A:  Shock resolved Relative adrenal insufficiency P:  Observe off steroids Continue Midodrine 20mg  PO TID  RENAL A:  Hypomag Hypokalemia > improving P:  Monitor BMET and UOP intermittently. Replace electrolytes as needed  GASTROINTESTINAL OGT A:  Severe malnutrition P:  Zofran PRN nausea PPI  Cont TF IR consulted for g-tube placement. On hold 8/12 for elevated WBC  HEMATOLOGIC  Recent Labs  05/18/15 0251 05/20/15 0244  HGB 7.0* 6.4*    A:  Anemia - due to blood loss from sacral wound and severe peri op bleeding from debridement, dilution DVT prevention Persistent leukocytosis May 2016 PE IVC filter placed 8/6 P:  Trend CBC in am  SCD Monitor for bleeding Transfuse 8/12   INFECTIOUS A:  MRSA bacteremia, neg TEE, likely osteomyelitis source E coli UTI  New HCAP likely Once finish abx course, need to remove all lines  MDR acinatobacter P:  BCx2 7/18 >>  MRSA BCx2 7/21 >>MRSA, strep Wound/tissue culture 7/28 >> Abscess culture 7/28 >> Bronch bal 7/31 >> MDR acint  BAL 8/4>>>few GNR>>>  Vancomycin 7/18>>> Aztreonam 7/18>>>8/3 8/1 cipro>>>8/3 8/3 gent >>>8/7 8/3 tygacyclin>>>   ID following   ENDOCRINE A:  Possible adrenal insufficiency  T1 DM hypoglycemia P:  SSI Observe off steroids    NEUROLOGIC A: Quad post trauma Post op pain  Acute encephalopathy 7/31 > improved, 8/12 resolved P:  RASS goal 0 Continue fent patch & fent prn remeron qhs  Baclofen, lyrica   Continue daily PSV weaning, G-tube to be inserted by IR (already saw the patient and CT is ordered but waiting for WBC to decrease), mother and patient ok with that, order in, arranging home with home vent [facility].  Hgb <7.0 therefore transfuse 8/12.  [name] Torrez Renfroe ACNP [name] PCCM Pager [phone] till 3 pm If no answer page [phone] 05/20/2015, 10:31 AM

## 2015-05-21 ENCOUNTER — Inpatient Hospital Stay (HOSPITAL_COMMUNITY): Payer: Medicaid Other

## 2015-05-21 DIAGNOSIS — Z9119 Patient's noncompliance with other medical treatment and regimen: Secondary | ICD-10-CM | POA: Insufficient documentation

## 2015-05-21 DIAGNOSIS — Z91199 Patient's noncompliance with other medical treatment and regimen due to unspecified reason: Secondary | ICD-10-CM | POA: Insufficient documentation

## 2015-05-21 DIAGNOSIS — G9519 Other vascular myelopathies: Secondary | ICD-10-CM

## 2015-05-21 LAB — BASIC METABOLIC PANEL
ANION GAP: 6 (ref 5–15)
BUN: 15 mg/dL (ref 6–20)
CALCIUM: 7.3 mg/dL — AB (ref 8.9–10.3)
CHLORIDE: 114 mmol/L — AB (ref 101–111)
CO2: 21 mmol/L — ABNORMAL LOW (ref 22–32)
Creatinine, Ser: 0.47 mg/dL — ABNORMAL LOW (ref 0.61–1.24)
Glucose, Bld: 194 mg/dL — ABNORMAL HIGH (ref 65–99)
Potassium: 3.9 mmol/L (ref 3.5–5.1)
Sodium: 141 mmol/L (ref 135–145)

## 2015-05-21 LAB — TYPE AND SCREEN
ABO/RH(D): O POS
Antibody Screen: NEGATIVE
Unit division: 0

## 2015-05-21 LAB — CULTURE, RESPIRATORY W GRAM STAIN

## 2015-05-21 LAB — CBC
HEMATOCRIT: 23.3 % — AB (ref 39.0–52.0)
Hemoglobin: 7.9 g/dL — ABNORMAL LOW (ref 13.0–17.0)
MCH: 30 pg (ref 26.0–34.0)
MCHC: 33.9 g/dL (ref 30.0–36.0)
MCV: 88.6 fL (ref 78.0–100.0)
PLATELETS: 306 10*3/uL (ref 150–400)
RBC: 2.63 MIL/uL — AB (ref 4.22–5.81)
RDW: 15.4 % (ref 11.5–15.5)
WBC: 33.2 10*3/uL — ABNORMAL HIGH (ref 4.0–10.5)

## 2015-05-21 LAB — GLUCOSE, CAPILLARY
GLUCOSE-CAPILLARY: 157 mg/dL — AB (ref 65–99)
GLUCOSE-CAPILLARY: 118 mg/dL — AB (ref 65–99)
GLUCOSE-CAPILLARY: 169 mg/dL — AB (ref 65–99)
GLUCOSE-CAPILLARY: 135 mg/dL — AB (ref 65–99)
Glucose-Capillary: 202 mg/dL — ABNORMAL HIGH (ref 65–99)
Glucose-Capillary: 222 mg/dL — ABNORMAL HIGH (ref 65–99)

## 2015-05-21 LAB — CULTURE, RESPIRATORY

## 2015-05-21 LAB — MAGNESIUM: MAGNESIUM: 1.5 mg/dL — AB (ref 1.7–2.4)

## 2015-05-21 MED ORDER — IBUPROFEN 100 MG/5ML PO SUSP
200.0000 mg | Freq: Once | ORAL | Status: AC
  Administered 2015-05-21: 200 mg
  Filled 2015-05-21: qty 10

## 2015-05-21 NOTE — Progress Notes (Signed)
05/21/2015 Patient temp at 1200 was 101.1,she was given tylenol and at 1452 it increase to 101.8. Dr Nelda Marseille was made aware, he was given  One time dose of ibuprofen. At 1600 it was 100.8 and 1911 was 99.0. [facility] RN.

## 2015-05-21 NOTE — Progress Notes (Signed)
Patient refused 0400 CPT

## 2015-05-21 NOTE — Progress Notes (Signed)
16 Days Post-Op  Subjective: Patient awake in the bed with trach in place and mom at the bedside.  He was alert and responded appropriately.  His wounds are stable.  Mom is requesting a VAC for the sacral wound.  Patient was seen two days ago.  Objective: Vital signs in last 24 hours: Temp:  [98.6 F (37 C)-100.3 F (37.9 C)] 100.3 F (37.9 C) (08/13 0403) Pulse Rate:  [96-118] 105 (08/13 0600) Resp:  [12-27] 16 (08/13 0600) BP: (99-131)/(32-90) 113/68 mmHg (08/13 0600) SpO2:  [100 %] 100 % (08/13 0812) FiO2 (%):  [40 %] 40 % (08/13 0812) Weight:  [65.772 kg (145 lb)] 65.772 kg (145 lb) (08/13 0650) Last BM Date: 05/21/15  Intake/Output from previous day: 08/12 0701 - 08/13 0700 In: 2969.8 [Blood:335; VZ/DG:3875; IV Piggyback:954.8] Out: 2575 [Urine:1200; IEPPI:9518] Intake/Output this shift:    General appearance: alert and no distress Incision/Wound: wounds getting wet to dry dressings.  No purulence noted from any especially the right ischial area.  The muscle is dusky in color.    Lab Results:   Recent Labs  05/20/15 0244 05/21/15 0326  WBC 35.0* 33.2*  HGB 6.4* 7.9*  HCT 19.8* 23.3*  PLT 219 306   BMET  Recent Labs  05/20/15 0244 05/21/15 0326  NA 141 141  K 4.4 3.9  CL 112* 114*  CO2 21* 21*  GLUCOSE 129* 194*  BUN 16 15  CREATININE 0.42* 0.47*  CALCIUM 7.2* 7.3*   PT/INR  Recent Labs  05/20/15 0244  LABPROT 41.1*  INR 4.44*   ABG No results for input(s): PHART, HCO3 in the last 72 hours.  Invalid input(s): PCO2, PO2  Studies/Results: No results found.  Anti-infectives: Anti-infectives    Start     Dose/Rate Route Frequency Ordered Stop   05/20/15 1300  linezolid (ZYVOX) IVPB 600 mg     600 mg 300 mL/hr over 60 Minutes Intravenous Every 12 hours 05/20/15 1218     05/20/15 1300  imipenem-cilastatin (PRIMAXIN) 500 mg in sodium chloride 0.9 % 100 mL IVPB     500 mg 200 mL/hr over 30 Minutes Intravenous 4 times per day 05/20/15 1218     05/20/15 1300  gentamicin (GARAMYCIN) 190 mg in dextrose 5 % 50 mL IVPB     2.5 mg/kg  75.8 kg 109.5 mL/hr over 30 Minutes Intravenous Every 24 hours 05/20/15 1218     05/18/15 0030  vancomycin (VANCOCIN) IVPB 1000 mg/200 mL premix  Status:  Discontinued     1,000 mg 200 mL/hr over 60 Minutes Intravenous Every 24 hours 05/18/15 0017 05/20/15 1218   05/14/15 0029  vancomycin (VANCOCIN) IVPB 750 mg/150 ml premix  Status:  Discontinued     750 mg 150 mL/hr over 60 Minutes Intravenous Every 24 hours 05/14/15 0029 05/18/15 0017   05/13/15 1000  gentamicin (GARAMYCIN) 520 mg in dextrose 5 % 100 mL IVPB  Status:  Discontinued     7 mg/kg  74.5 kg 113 mL/hr over 60 Minutes Intravenous Every 48 hours 05/12/15 0044 05/17/15 1403   05/12/15 0000  tigecycline (TYGACIL) 50 mg in sodium chloride 0.9 % 100 mL IVPB  Status:  Discontinued     50 mg 200 mL/hr over 30 Minutes Intravenous Every 12 hours 05/11/15 0932 05/20/15 1218   05/11/15 1200  tigecycline (TYGACIL) 100 mg in sodium chloride 0.9 % 100 mL IVPB     100 mg 200 mL/hr over 30 Minutes Intravenous  Once 05/11/15 0932 05/11/15 1443  05/11/15 1015  gentamicin (GARAMYCIN) 520 mg in dextrose 5 % 100 mL IVPB  Status:  Discontinued     7 mg/kg  74.5 kg 113 mL/hr over 60 Minutes Intravenous Every 24 hours 05/11/15 1000 05/12/15 0043   05/09/15 0930  ciprofloxacin (CIPRO) IVPB 400 mg  Status:  Discontinued     400 mg 200 mL/hr over 60 Minutes Intravenous Every 12 hours 05/09/15 0904 05/11/15 0823   05/08/15 0900  aztreonam (AZACTAM) 2 g in dextrose 5 % 50 mL IVPB  Status:  Discontinued     2 g 100 mL/hr over 30 Minutes Intravenous 3 times per day 05/08/15 0826 05/11/15 0823   05/06/15 0000  vancomycin (VANCOCIN) IVPB 750 mg/150 ml premix  Status:  Discontinued     750 mg 150 mL/hr over 60 Minutes Intravenous Every 48 hours 05/05/15 2357 05/14/15 0030   04/28/15 2000  vancomycin (VANCOCIN) IVPB 750 mg/150 ml premix  Status:  Discontinued     750  mg 150 mL/hr over 60 Minutes Intravenous Every 24 hours 04/28/15 0731 05/04/15 1932   04/26/15 0800  vancomycin (VANCOCIN) IVPB 750 mg/150 ml premix  Status:  Discontinued     750 mg 150 mL/hr over 60 Minutes Intravenous Every 12 hours 04/25/15 1715 04/28/15 0731   04/26/15 0200  aztreonam (AZACTAM) 1 g in dextrose 5 % 50 mL IVPB  Status:  Discontinued     1 g 100 mL/hr over 30 Minutes Intravenous Every 8 hours 04/25/15 1919 05/07/15 1035   04/25/15 1715  vancomycin (VANCOCIN) IVPB 750 mg/150 ml premix     750 mg 150 mL/hr over 60 Minutes Intravenous STAT 04/25/15 1715 04/25/15 1906   04/25/15 1645  aztreonam (AZACTAM) 2 g in dextrose 5 % 50 mL IVPB     2 g 100 mL/hr over 30 Minutes Intravenous  Once 04/25/15 1643 04/25/15 1807      Assessment/Plan: s/p Procedure(s): IRRIGATION AND DEBRIDEMENT SACRAL ULCER (Bilateral) Discharge per primary team. Very difficult situation with paralysis, malnutrition and history of bleeding.  A flap is the best way of definitive closure but not an option any time soon if at all with malnutrition.  Recommend continuation of positioning off the area, air mattress bed, wet to dry dressing changes twice a day. Maximize nutrition.  Bed side debridement may be possible but cautiously due to bleeding.  No plans to return to the OR at this time.  All was discussed with the patient and the mom.  LOS: 26 days    [facility] 05/21/2015

## 2015-05-21 NOTE — Progress Notes (Signed)
PULMONARY / CRITICAL CARE MEDICINE   Name: Jason [name] MRN: [id number] DOB: 1985-12-31    ADMISSION DATE: 04/25/2015 TODAY'S DATE: 05/10/2015  REFERRING MD : TRH  CHIEF COMPLAINT: Hypotension  INITIAL PRESENTATION: 29 year old male quad with MRSA bacteremia from multiple sources. Concern for endocarditis. Wounds have been checked and followed by general surgery and ortho. Patient has history of hypotension and concern for septic shock and levophed was started and PCCM consulted. 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 ?  HCAP.   SIGNIFICANT EVENTS: 7/28 >> OR for debridement of osteomyelitis and abscess cx with staph. Positive MRSA and strep blood cx 7/28 >> Reconsulted for extensive post op bleeding and hypotension 7/31 >> worsening respiratory status, acidosis, hypoxemia, hypotension overnight  STUDIES: 7/28 TEE >> NO vegetation, normal LV function  7/31 Bronch >> HCAP, extensive mucous plug 8/2 bronch - no sig obstruction 8/2- family does not want trach at this point even though is recommended 8/3 bronch lungs open, clear, extubated, re intubated lat evening due to collapse 8/4- pt changed decision, trach placed 8/8 weaned 10+ hours  SUBJECTIVE/OVERNIGHT:    nad on vent/ no new concerns per nursing   VITAL SIGNS: Temp:  [98.6 F (37 C)-100.5 F (38.1 C)] 100.5 F (38.1 C) (08/13 0800) Pulse Rate:  [96-118] 108 (08/13 0800) Resp:  [12-23] 16 (08/13 0800) BP: (106-131)/(32-90) 108/65 mmHg (08/13 0800) SpO2:  [100 %] 100 % (08/13 0812) FiO2 (%):  [40 %] 40 % (08/13 0812) Weight:  [145 lb (65.772 kg)] 145 lb (65.772 kg) (08/13 0650) HEMODYNAMICS:   VENTILATOR SETTINGS: Vent Mode:  [-] PRVC FiO2 (%):  [40 %] 40 % Set Rate:  [16 bmp] 16 bmp Vt Set:  [570 mL] 570 mL PEEP:  [5 cmH20] 5 cmH20 Plateau Pressure:  [25 cmH20-39 cmH20] 25 cmH20 INTAKE / OUTPUT:  Intake/Output Summary (Last 24 hours) at 05/21/15  1033 Last data filed at 05/21/15 0600  Gross per 24 hour  Intake 2689.75 ml  Output   2400 ml  Net 289.75 ml    PHYSICAL EXAMINATION:  Gen: chronically ill appearing, sleepy on vent HENT: trach clean, dry PULM: vent supported breaths, minimal bilateral insp / exp rhonchi CV: RRR, no mgr, edema arms, feet GI: BS+, soft, nontender Derm:  sacral wound stage 4 plus Neuro:  Sleepy but arouses to verbal, nods approp   LABS:  CBC  Recent Labs Lab 05/18/15 0251 05/20/15 0244 05/21/15 0326  WBC 35.4* 35.0* 33.2*  HGB 7.0* 6.4* 7.9*  HCT 21.3* 19.8* 23.3*  PLT 206 219 306   Coag's  Recent Labs Lab 05/20/15 0244  INR 4.44*   BMET  Recent Labs Lab 05/18/15 0251 05/20/15 0244 05/21/15 0326  NA 143 141 141  K 4.2 4.4 3.9  CL 116* 112* 114*  CO2 22 21* 21*  BUN 17 16 15   CREATININE 0.40* 0.42* 0.47*  GLUCOSE 229* 129* 194*   Electrolytes  Recent Labs Lab 05/18/15 0251 05/19/15 0430 05/20/15 0244 05/21/15 0326  CALCIUM 7.1*  --  7.2* 7.3*  MG 1.8 1.7 1.5* 1.5*  PHOS  --   --  1.5*  --    Sepsis Markers No results for input(s): LATICACIDVEN, PROCALCITON, O2SATVEN in the last 168 hours. ABG No results for input(s): PHART, PCO2ART, PO2ART in the last 168 hours. Liver Enzymes No results for input(s): AST, ALT, ALKPHOS, BILITOT, ALBUMIN in the last 168 hours. Cardiac Enzymes No results for input(s):  TROPONINI, PROBNP in the last 168 hours. Glucose  Recent Labs Lab 05/20/15 0022 05/20/15 0500 05/20/15 1729 05/21/15 0123 05/21/15 0410 05/21/15 0851  GLUCAP 172* 172* 192* 202* 157* 118*   Imaging  I personally reviewed images and agree with radiology impression as follows:  CXR:  05/21/15 Improved bilateral lung opacities are noted consistent with improving pneumonia or edema. Stable support apparatus.   ASSESSMENT / PLAN:  PULMONARY A:  Acute hypoxemia respiratory failure > due to pulm edema vs HCAP with atelectasis  Pleural effusion R base,  small Collapse rt base RML, RLL> improved post bronch but still  reintubated Trach 8/5 >> P:  PS as able but do not suspect liberation from vent. Chest PT. In-line PMV - will likely be prolonged wean.    CARDIOVASCULAR Right Arm PICC 7/19>>> A:  Shock resolved Relative adrenal insufficiency P:  Observe off steroids Continue Midodrine 20mg  PO TID  RENAL A:  Hypomag Hypokalemia > improving P:  Monitor BMET and UOP intermittently. Replace electrolytes as needed  GASTROINTESTINAL OGT A:  Severe malnutrition P:  Zofran PRN nausea PPI  Cont TF IR consulted for g-tube placement.  HEMATOLOGIC A:  Anemia - due to blood loss from sacral wound and severe peri op bleeding from debridement, dilution DVT prevention Persistent leukocytosis May 2016 PE IVC filter placed 8/6  Recent Labs Lab 05/18/15 0251 05/20/15 0244 05/21/15 0326  HGB 7.0* 6.4* 7.9*    P:  Trend CBC in am  SCD Monitor for bleeding Transfused 1 unit pRBC 8/12> approp response   INFECTIOUS A:  MRSA bacteremia, neg TEE, likely osteomyelitis source E coli UTI  New HCAP likely Once finish abx course, need to remove all lines  MDR acinatobacter P:  BCx2 7/18 >>  MRSA BCx2 7/21 >>MRSA, strep Wound/tissue culture 7/28 >>  Bronch bal 7/31 >> MDR acint  BAL 8/4>>>few GNR>>>acinetobacter   Vanc 7/18 >   8/11 Aztreonam 7/18>>>8/3 8/1 cipro>>>8/3 8/3 gent   ID following /Rx per ID    ENDOCRINE A:  Possible adrenal insufficiency  T1 DM hypoglycemia P:  SSI Observe off steroids    NEUROLOGIC A: Quad post trauma Post op pain  Acute encephalopathy 7/31 > improved P:  RASS goal 0 Continue fent patch & fent prn remeron qhs  Baclofen, lyrica     No fm at bedside   Christinia Gully, MD Pulmonary and [place] [phone] After 5:30 PM or weekends, call [phone]

## 2015-05-21 NOTE — Progress Notes (Signed)
[facility]    Subjective:  A she and still having fevers he is refusing CPT   Antibiotics:  Anti-infectives    Start     Dose/Rate Route Frequency Ordered Stop   05/20/15 1300  linezolid (ZYVOX) IVPB 600 mg     600 mg 300 mL/hr over 60 Minutes Intravenous Every 12 hours 05/20/15 1218     05/20/15 1300  imipenem-cilastatin (PRIMAXIN) 500 mg in sodium chloride 0.9 % 100 mL IVPB     500 mg 200 mL/hr over 30 Minutes Intravenous 4 times per day 05/20/15 1218     05/20/15 1300  gentamicin (GARAMYCIN) 190 mg in dextrose 5 % 50 mL IVPB     2.5 mg/kg  75.8 kg 109.5 mL/hr over 30 Minutes Intravenous Every 24 hours 05/20/15 1218     05/18/15 0030  vancomycin (VANCOCIN) IVPB 1000 mg/200 mL premix  Status:  Discontinued     1,000 mg 200 mL/hr over 60 Minutes Intravenous Every 24 hours 05/18/15 0017 05/20/15 1218   05/14/15 0029  vancomycin (VANCOCIN) IVPB 750 mg/150 ml premix  Status:  Discontinued     750 mg 150 mL/hr over 60 Minutes Intravenous Every 24 hours 05/14/15 0029 05/18/15 0017   05/13/15 1000  gentamicin (GARAMYCIN) 520 mg in dextrose 5 % 100 mL IVPB  Status:  Discontinued     7 mg/kg  74.5 kg 113 mL/hr over 60 Minutes Intravenous Every 48 hours 05/12/15 0044 05/17/15 1403   05/12/15 0000  tigecycline (TYGACIL) 50 mg in sodium chloride 0.9 % 100 mL IVPB  Status:  Discontinued     50 mg 200 mL/hr over 30 Minutes Intravenous Every 12 hours 05/11/15 0932 05/20/15 1218   05/11/15 1200  tigecycline (TYGACIL) 100 mg in sodium chloride 0.9 % 100 mL IVPB     100 mg 200 mL/hr over 30 Minutes Intravenous  Once 05/11/15 0932 05/11/15 1443   05/11/15 1015  gentamicin (GARAMYCIN) 520 mg in dextrose 5 % 100 mL IVPB  Status:  Discontinued     7 mg/kg  74.5 kg 113 mL/hr over 60 Minutes Intravenous Every 24 hours 05/11/15 1000 05/12/15 0043   05/09/15 0930  ciprofloxacin (CIPRO) IVPB 400 mg  Status:  Discontinued     400 mg 200 mL/hr over  60 Minutes Intravenous Every 12 hours 05/09/15 0904 05/11/15 0823   05/08/15 0900  aztreonam (AZACTAM) 2 g in dextrose 5 % 50 mL IVPB  Status:  Discontinued     2 g 100 mL/hr over 30 Minutes Intravenous 3 times per day 05/08/15 0826 05/11/15 0823   05/06/15 0000  vancomycin (VANCOCIN) IVPB 750 mg/150 ml premix  Status:  Discontinued     750 mg 150 mL/hr over 60 Minutes Intravenous Every 48 hours 05/05/15 2357 05/14/15 0030   04/28/15 2000  vancomycin (VANCOCIN) IVPB 750 mg/150 ml premix  Status:  Discontinued     750 mg 150 mL/hr over 60 Minutes Intravenous Every 24 hours 04/28/15 0731 05/04/15 1932   04/26/15 0800  vancomycin (VANCOCIN) IVPB 750 mg/150 ml premix  Status:  Discontinued     750 mg 150 mL/hr over 60 Minutes Intravenous Every 12 hours 04/25/15 1715 04/28/15 0731   04/26/15 0200  aztreonam (AZACTAM) 1 g in dextrose 5 % 50 mL IVPB  Status:  Discontinued     1 g 100 mL/hr over 30 Minutes Intravenous Every 8 hours 04/25/15 1919 05/07/15  1035   04/25/15 1715  vancomycin (VANCOCIN) IVPB 750 mg/150 ml premix     750 mg 150 mL/hr over 60 Minutes Intravenous STAT 04/25/15 1715 04/25/15 1906   04/25/15 1645  aztreonam (AZACTAM) 2 g in dextrose 5 % 50 mL IVPB     2 g 100 mL/hr over 30 Minutes Intravenous  Once 04/25/15 1643 04/25/15 1807      Medications: Scheduled Meds: . sodium chloride   Intravenous Once  . albuterol  2.5 mg Nebulization Q4H  . antiseptic oral rinse  7 mL Mouth Rinse QID  . baclofen  10 mg Oral BID WC  . baclofen  20 mg Oral QHS  . chlorhexidine gluconate  15 mL Mouth Rinse BID  . fentaNYL  50 mcg Transdermal Q72H  . gentamicin  2.5 mg/kg Intravenous Q24H  . ibuprofen  200 mg Per Tube Once  . imipenem-cilastatin  500 mg Intravenous 4 times per day  . insulin aspart  0-15 Units Subcutaneous 6 times per day  . linezolid  600 mg Intravenous Q12H  . loratadine  10 mg Oral QHS  . midodrine  20 mg Oral TID WC  . mirtazapine  7.5 mg Oral QHS  . pantoprazole  sodium  40 mg Per Tube Daily  . pregabalin  100 mg Oral TID AC  . pregabalin  200 mg Oral QHS  . sodium chloride  3 mL Intracatheter Q12H   Continuous Infusions: . sodium chloride 10 mL/hr at 05/17/15 0016  . feeding supplement (VITAL AF 1.2 CAL) 1,000 mL (05/21/15 0600)   PRN Meds:.acetaminophen **OR** acetaminophen, albuterol, albuterol, fentaNYL (SUBLIMAZE) injection, midazolam, ondansetron, oxyCODONE, sodium chloride    Objective: Weight change: -22 lb (-9.979 kg)  Intake/Output Summary (Last 24 hours) at 05/21/15 1543 Last data filed at 05/21/15 0600  Gross per 24 hour  Intake 1631.67 ml  Output   2050 ml  Net -418.33 ml   Blood pressure 122/82, pulse 108, temperature 101.8 F (38.8 C), temperature source Oral, resp. rate 16, height 5\' 8"  (1.727 m), weight 145 lb (65.772 kg), SpO2 100 %. Temp:  [98.6 F (37 C)-101.8 F (38.8 C)] 101.8 F (38.8 C) (08/13 1452) Pulse Rate:  [105-118] 108 (08/13 1200) Resp:  [12-23] 16 (08/13 1200) BP: (106-129)/(32-90) 122/82 mmHg (08/13 1200) SpO2:  [100 %] 100 % (08/13 1200) FiO2 (%):  [40 %] 40 % (08/13 1202) Weight:  [145 lb (65.772 kg)] 145 lb (65.772 kg) (08/13 0650)  Physical Exam: General: Alert and awake, oriented  HEENT: anicteric sclera, pupils reactive to light and accommodation, EOMI, tracheostomy CVS tachy rate, normal r,  no murmur rubs or gallops Chest rhonchi throughout but better than yesterday Abdomen: soft , nondistended, ostomy bag with signficant stool, urine clean  Neuro: quadriplegic  Skin: not examined thoroughly today see prior note  CBC: CBC Latest Ref Rng 05/21/2015 05/20/2015 05/18/2015  WBC 4.0 - 10.5 K/uL 33.2(H) 35.0(H) 35.4(H)  Hemoglobin 13.0 - 17.0 g/dL 7.9(L) 6.4(LL) 7.0(L)  Hematocrit 39.0 - 52.0 % 23.3(L) 19.8(L) 21.3(L)  Platelets 150 - 400 K/uL 306 219 206     BMET  Recent Labs  05/20/15 0244 05/21/15 0326  NA 141 141  K 4.4 3.9  CL 112* 114*  CO2 21* 21*  GLUCOSE 129* 194*    BUN 16 15  CREATININE 0.42* 0.47*  CALCIUM 7.2* 7.3*     Liver Panel  No results for input(s): PROT, ALBUMIN, AST, ALT, ALKPHOS, BILITOT, BILIDIR, IBILI in the last 72 hours.  Sedimentation Rate No results for input(s): ESRSEDRATE in the last 72 hours. C-Reactive Protein No results for input(s): CRP in the last 72 hours.  Micro Results: Recent Results (from the past 720 hour(s))  Urine culture     Status: None   Collection Time: 04/25/15  4:35 PM  Result Value Ref Range Status   Specimen Description URINE, CLEAN CATCH  Final   Special Requests Normal  Final   Culture   Final    >=100,000 COLONIES/mL ESCHERICHIA COLI AZTREONAM SENSITIVE ZONE 32MM Performed at [facility] Performed at [facility]    Report Status 05/02/2015 FINAL  Final   Organism ID, Bacteria ESCHERICHIA COLI  Final      Susceptibility   Escherichia coli - MIC*    AMPICILLIN <=2 SENSITIVE Sensitive     CEFAZOLIN <=4 SENSITIVE Sensitive     CEFTRIAXONE <=1 SENSITIVE Sensitive     CIPROFLOXACIN >=4 RESISTANT Resistant     GENTAMICIN <=1 SENSITIVE Sensitive     IMIPENEM <=0.25 SENSITIVE Sensitive     NITROFURANTOIN <=16 SENSITIVE Sensitive     TRIMETH/SULFA <=20 SENSITIVE Sensitive     AMPICILLIN/SULBACTAM <=2 SENSITIVE Sensitive     PIP/TAZO <=4 SENSITIVE Sensitive     * >=100,000 COLONIES/mL ESCHERICHIA COLI  Blood culture (routine x 2)     Status: None   Collection Time: 04/25/15  5:00 PM  Result Value Ref Range Status   Specimen Description BLOOD RIGHT ANTECUBITAL  Final   Special Requests BOTTLES DRAWN AEROBIC AND ANAEROBIC 5 CC EA  Final   Culture  Setup Time   Final    GRAM POSITIVE COCCI IN CLUSTERS Gram Stain Report Called to,Read Back By and Verified With: Elveria Rising RN 17:30 04/26/15 ([email or web address]) IN BOTH AEROBIC AND ANAEROBIC BOTTLES    Culture   Final    METHICILLIN RESISTANT STAPHYLOCOCCUS AUREUS MICROAEROPHILIC STREPTOCOCCI Standardized susceptibility testing for  this organism is not available. Performed at [facility]    Report Status 04/30/2015 FINAL  Final   Organism ID, Bacteria METHICILLIN RESISTANT STAPHYLOCOCCUS AUREUS  Final      Susceptibility   Methicillin resistant staphylococcus aureus - MIC*    CIPROFLOXACIN >=8 RESISTANT Resistant     ERYTHROMYCIN >=8 RESISTANT Resistant     GENTAMICIN <=0.5 SENSITIVE Sensitive     OXACILLIN >=4 RESISTANT Resistant     TETRACYCLINE <=1 SENSITIVE Sensitive     VANCOMYCIN <=0.5 SENSITIVE Sensitive     TRIMETH/SULFA >=320 RESISTANT Resistant     CLINDAMYCIN >=8 RESISTANT Resistant     RIFAMPIN <=0.5 SENSITIVE Sensitive     Inducible Clindamycin NEGATIVE Sensitive     * METHICILLIN RESISTANT STAPHYLOCOCCUS AUREUS  Blood culture (routine x 2)     Status: None   Collection Time: 04/25/15  5:16 PM  Result Value Ref Range Status   Specimen Description BLOOD BLOOD RIGHT FOREARM  Final   Special Requests BOTTLES DRAWN AEROBIC AND ANAEROBIC 5 CC EA  Final   Culture  Setup Time   Final    GRAM POSITIVE COCCI IN CLUSTERS CRITICAL RESULT CALLED TO, READ BACK BY AND VERIFIED WITH: S GRAHAM 04/26/15 @ 42 M VESTAL IN BOTH AEROBIC AND ANAEROBIC BOTTLES    Culture   Final    METHICILLIN RESISTANT STAPHYLOCOCCUS AUREUS SUSCEPTIBILITIES PERFORMED ON PREVIOUS CULTURE WITHIN THE LAST 5 DAYS. MICROAEROPHILIC STREPTOCOCCI Standardized susceptibility testing for this organism is not available. Performed at [facility]    Report Status 05/03/2015 FINAL  Final  MRSA  PCR Screening     Status: Abnormal   Collection Time: 04/25/15  6:55 PM  Result Value Ref Range Status   MRSA by PCR POSITIVE (A) NEGATIVE Final    Comment:        The GeneXpert MRSA Assay (FDA approved for NASAL specimens only), is one component of a comprehensive MRSA colonization surveillance program. It is not intended to diagnose MRSA infection nor to guide or monitor treatment for MRSA infections. RESULT CALLED TO, READ  BACK BY AND VERIFIED WITH: Sharyn Lull REEVES,RN [id number] @ 2043 BY J SCOTTON   Culture, blood (routine x 2)     Status: None   Collection Time: 04/28/15  1:20 PM  Result Value Ref Range Status   Specimen Description BLOOD LEFT ARM  Final   Special Requests IN PEDIATRIC BOTTLE 1CC  Final   Culture  Setup Time   Final    GRAM POSITIVE COCCI IN CLUSTERS AEROBIC BOTTLE ONLY CRITICAL RESULT CALLED TO, READ BACK BY AND VERIFIED WITH: Hetty Blend RN [id number] 04/30/15 A BROWNING    Culture   Final    MICROAEROPHILIC STREPTOCOCCI Standardized susceptibility testing for this organism is not available. Performed at [facility]    Report Status 05/03/2015 FINAL  Final  Culture, blood (routine x 2)     Status: None   Collection Time: 04/28/15  5:50 PM  Result Value Ref Range Status   Specimen Description BLOOD PICC LINE  Final   Special Requests BOTTLES DRAWN AEROBIC AND ANAEROBIC 5CC EACH  Final   Culture   Final    NO GROWTH 5 DAYS Performed at [facility]    Report Status 05/03/2015 FINAL  Final  Culture, blood (routine x 2)     Status: None   Collection Time: 05/04/15  6:30 PM  Result Value Ref Range Status   Specimen Description BLOOD PICC LINE  Final   Special Requests BOTTLES DRAWN AEROBIC AND ANAEROBIC 5ML  Final   Culture   Final    NO GROWTH 5 DAYS Performed at [facility]    Report Status 05/09/2015 FINAL  Final  Culture, blood (routine x 2)     Status: None   Collection Time: 05/04/15  6:30 PM  Result Value Ref Range Status   Specimen Description BLOOD PICC LINE  Final   Special Requests BOTTLES DRAWN AEROBIC AND ANAEROBIC 5ML  Final   Culture   Final    NO GROWTH 5 DAYS Performed at [facility]    Report Status 05/09/2015 FINAL  Final  Anaerobic culture     Status: None   Collection Time: 05/05/15  1:01 PM  Result Value Ref Range Status   Specimen Description ABSCESS  Final   Special Requests SPEC A ON SWAB RIGHT PELVIC ABSCESS  Final   Gram  Stain   Final    FEW WBC PRESENT, PREDOMINANTLY MONONUCLEAR NO SQUAMOUS EPITHELIAL CELLS SEEN FEW GRAM POSITIVE COCCI IN PAIRS IN CLUSTERS Performed at [facility]    Culture   Final    NO ANAEROBES ISOLATED Performed at [facility]    Report Status 05/10/2015 FINAL  Final  Culture, routine-abscess     Status: None   Collection Time: 05/05/15  1:01 PM  Result Value Ref Range Status   Specimen Description ABSCESS  Final   Special Requests SPEC A ON SWAB RIGHT PELVIC ABSCESS  Final   Gram Stain   Final    FEW WBC PRESENT, PREDOMINANTLY MONONUCLEAR  NO SQUAMOUS EPITHELIAL CELLS SEEN FEW GRAM POSITIVE COCCI IN PAIRS IN CLUSTERS Performed at [facility]    Culture   Final    MODERATE METHICILLIN RESISTANT STAPHYLOCOCCUS AUREUS Note: RIFAMPIN AND GENTAMICIN SHOULD NOT BE USED AS SINGLE DRUGS FOR TREATMENT OF STAPH INFECTIONS. CRITICAL RESULT CALLED TO, READ BACK BY AND VERIFIED WITH: IREKIA B. 8/1 @216  BY REAMM Performed at [facility]    Report Status 05/09/2015 FINAL  Final   Organism ID, Bacteria METHICILLIN RESISTANT STAPHYLOCOCCUS AUREUS  Final      Susceptibility   Methicillin resistant staphylococcus aureus - MIC*    CLINDAMYCIN >=8 RESISTANT Resistant     ERYTHROMYCIN >=8 RESISTANT Resistant     GENTAMICIN <=0.5 SENSITIVE Sensitive     LEVOFLOXACIN >=8 RESISTANT Resistant     OXACILLIN >=4 RESISTANT Resistant     RIFAMPIN <=0.5 SENSITIVE Sensitive     TRIMETH/SULFA >=320 RESISTANT Resistant     VANCOMYCIN 1 SENSITIVE Sensitive     TETRACYCLINE <=1 SENSITIVE Sensitive     * MODERATE METHICILLIN RESISTANT STAPHYLOCOCCUS AUREUS  Tissue culture     Status: None   Collection Time: 05/05/15  1:13 PM  Result Value Ref Range Status   Specimen Description TISSUE  Final   Special Requests SPEC B IN CUP BONE RIGHT ISCHIUM  Final   Gram Stain   Final    RARE WBC PRESENT, PREDOMINANTLY MONONUCLEAR NO ORGANISMS SEEN Performed at [facility]    Culture   Final    FEW METHICILLIN RESISTANT STAPHYLOCOCCUS AUREUS Note: RIFAMPIN AND GENTAMICIN SHOULD NOT BE USED AS SINGLE DRUGS FOR TREATMENT OF STAPH INFECTIONS. CRITICAL RESULT CALLED TO, READ BACK BY AND VERIFIED WITH: IREKIA B. 8/1 @216  BY REAMM Performed at [facility]    Report Status 05/09/2015 FINAL  Final   Organism ID, Bacteria METHICILLIN RESISTANT STAPHYLOCOCCUS AUREUS  Final      Susceptibility   Methicillin resistant staphylococcus aureus - MIC*    CLINDAMYCIN >=8 RESISTANT Resistant     ERYTHROMYCIN >=8 RESISTANT Resistant     GENTAMICIN <=0.5 SENSITIVE Sensitive     LEVOFLOXACIN >=8 RESISTANT Resistant     OXACILLIN >=4 RESISTANT Resistant     RIFAMPIN <=0.5 SENSITIVE Sensitive     TRIMETH/SULFA >=320 RESISTANT Resistant     VANCOMYCIN 1 SENSITIVE Sensitive     TETRACYCLINE <=1 SENSITIVE Sensitive     * FEW METHICILLIN RESISTANT STAPHYLOCOCCUS AUREUS  Culture, respiratory (NON-Expectorated)     Status: None   Collection Time: 05/08/15  8:36 AM  Result Value Ref Range Status   Specimen Description TRACHEAL ASPIRATE  Final   Special Requests Normal  Final   Gram Stain   Final    RARE WBC PRESENT, PREDOMINANTLY PMN NO SQUAMOUS EPITHELIAL CELLS SEEN FEW GRAM NEGATIVE RODS RARE GRAM POSITIVE COCCI IN PAIRS RARE YEAST    Culture   Final    ABUNDANT ACINETOBACTER CALCOACETICUS/BAUMANNII COMPLEX Note: TIGECYCLINE  MIC 1 COLISTIN SENSITIVE .0125 ug/mL  ETEST results for this drug are "FOR INVESTIGATIONAL USE ONLY" and should NOT be used for clinical purposes. Performed at [facility]    Report Status 05/12/2015 FINAL  Final   Organism ID, Bacteria ACINETOBACTER CALCOACETICUS/BAUMANNII COMPLEX  Final      Susceptibility   Acinetobacter calcoaceticus/baumannii complex - MIC*    AMPICILLIN >=32 RESISTANT Resistant     AMPICILLIN/SULBACTAM 4 SENSITIVE Sensitive     CEFAZOLIN >=64 RESISTANT Resistant     CEFTAZIDIME >=64 RESISTANT  Resistant     CEFTRIAXONE >=64 RESISTANT Resistant     CIPROFLOXACIN >=4 RESISTANT Resistant     GENTAMICIN <=1 SENSITIVE Sensitive     IMIPENEM 0.5 SENSITIVE Sensitive     PIP/TAZO >=128 RESISTANT Resistant     TOBRAMYCIN <=1 SENSITIVE Sensitive     TRIMETH/SULFA >=320 RESISTANT Resistant     * ABUNDANT ACINETOBACTER CALCOACETICUS/BAUMANNII COMPLEX  Culture, blood (routine x 2)     Status: None   Collection Time: 05/10/15  3:05 PM  Result Value Ref Range Status   Specimen Description Blood  Final   Special Requests Normal  Final   Culture  Setup Time TEST WILL BE CREDITED TEST CANCELLED PER RN   Final   Culture TEST WILL BE CREDITED TEST CANCELLED PER RN   Final   Report Status 05/10/2015 FINAL  Final  Culture, respiratory (NON-Expectorated)     Status: None   Collection Time: 05/12/15 12:20 AM  Result Value Ref Range Status   Specimen Description BRONCHIAL ALVEOLAR LAVAGE  Final   Special Requests NONE  Final   Gram Stain   Final    MODERATE WBC PRESENT,BOTH PMN AND MONONUCLEAR RARE SQUAMOUS EPITHELIAL CELLS PRESENT NO ORGANISMS SEEN Performed at [facility]    Culture   Final    FEW CANDIDA ALBICANS FEW ACINETOBACTER CALCOACETICUS/BAUMANNII COMPLEX Performed at [facility]    Report Status 05/15/2015 FINAL  Final   Organism ID, Bacteria ACINETOBACTER CALCOACETICUS/BAUMANNII COMPLEX  Final      Susceptibility   Acinetobacter calcoaceticus/baumannii complex - MIC*    AMPICILLIN >=32 RESISTANT Resistant     AMPICILLIN/SULBACTAM 4 SENSITIVE Sensitive     CEFAZOLIN >=64 RESISTANT Resistant     CEFTAZIDIME >=64 RESISTANT Resistant     CEFTRIAXONE >=64 RESISTANT Resistant     CIPROFLOXACIN >=4 RESISTANT Resistant     GENTAMICIN <=1 SENSITIVE Sensitive     IMIPENEM 0.5 SENSITIVE Sensitive     PIP/TAZO >=128 RESISTANT Resistant     TOBRAMYCIN <=1 SENSITIVE Sensitive     TRIMETH/SULFA >=320 RESISTANT Resistant     * FEW ACINETOBACTER  CALCOACETICUS/BAUMANNII COMPLEX  Culture, blood (routine x 2)     Status: None   Collection Time: 05/15/15  5:30 PM  Result Value Ref Range Status   Specimen Description BLOOD BLOOD LEFT HAND  Final   Special Requests BOTTLES DRAWN AEROBIC ONLY 1CC  Final   Culture NO GROWTH 5 DAYS  Final   Report Status 05/20/2015 FINAL  Final  Culture, blood (routine x 2)     Status: None   Collection Time: 05/15/15  6:22 PM  Result Value Ref Range Status   Specimen Description BLOOD RIGHT FOOT  Final   Special Requests IN PEDIATRIC BOTTLE 3CC  Final   Culture NO GROWTH 5 DAYS  Final   Report Status 05/20/2015 FINAL  Final  Culture, respiratory (NON-Expectorated)     Status: None   Collection Time: 05/18/15  7:46 PM  Result Value Ref Range Status   Specimen Description TRACHEAL ASPIRATE  Final   Special Requests NONE  Final   Gram Stain   Final    ABUNDANT WBC PRESENT,BOTH PMN AND MONONUCLEAR RARE SQUAMOUS EPITHELIAL CELLS PRESENT MODERATE GRAM NEGATIVE COCCOBACILLI RARE GRAM POSITIVE COCCI IN PAIRS    Culture   Final    ABUNDANT ACINETOBACTER CALCOACETICUS/BAUMANNII COMPLEX Performed at [facility]    Report Status 05/21/2015 FINAL  Final   Organism ID, Bacteria ACINETOBACTER CALCOACETICUS/BAUMANNII COMPLEX  Final  Susceptibility   Acinetobacter calcoaceticus/baumannii complex - MIC*    AMPICILLIN >=32 RESISTANT Resistant     AMPICILLIN/SULBACTAM 4 SENSITIVE Sensitive     CEFAZOLIN >=64 RESISTANT Resistant     CEFTAZIDIME >=64 RESISTANT Resistant     CEFTRIAXONE >=64 RESISTANT Resistant     CIPROFLOXACIN >=4 RESISTANT Resistant     GENTAMICIN 4 SENSITIVE Sensitive     IMIPENEM 0.5 SENSITIVE Sensitive     PIP/TAZO >=128 RESISTANT Resistant     TOBRAMYCIN <=1 SENSITIVE Sensitive     TRIMETH/SULFA >=320 RESISTANT Resistant     * ABUNDANT ACINETOBACTER CALCOACETICUS/BAUMANNII COMPLEX    Studies/Results: Dg Chest Port 1 View  05/21/2015   CLINICAL DATA:  Respiratory  failure.  EXAM: PORTABLE CHEST - 1 VIEW  COMPARISON:  May 14, 2015.  FINDINGS: Tracheostomy tube and nasogastric tube are unchanged in position. No pneumothorax or significant pleural effusion is noted. Bilateral perihilar and left basilar opacities are noted concerning for edema or pneumonia. These are improved compared to prior exam. Bony thorax is intact.  IMPRESSION: Improved bilateral lung opacities are noted consistent with improving pneumonia or edema. Stable support apparatus.   Electronically Signed   By: Marijo Conception, M.D.   On: 05/21/2015 09:41      Assessment/Plan:  Active Problems:   Type 1 diabetes, uncontrolled, with neuropathy   Spinal cord infarction (history of)   Sepsis   Stage IV decubitus ulcer   Urinary tract infectious disease   HCAP (healthcare-associated pneumonia)   Hypotension   Abdominopelvic abscess   Hemorrhagic shock   Acute blood loss anemia   Edema   MRSA bacteremia   Collapse, lung   Pulmonary emboli   Infection due to acinetobacter baumannii   VAP (ventilator-associated pneumonia)   Acute respiratory failure with hypoxemia   Goals of care, counseling/discussion   DVT (deep venous thrombosis)   Severe protein-calorie malnutrition    Jason [name] is a 29 y.o. 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, MRSA bacteremia, microaerophlic bacteremia and now VAP with acinetobacter and persistent fevers  #1 VAP: repeat BAL sent with abundant Acinetobacter growing again --Continue imipenem and gentamicin --Continue to attempt CPT the patient is not compliant with this   #2 MRSA bacteremia and microaerophillic streptococcal bacteremia Continue  ZYVOX for now then back to vancomycin if and when we can clear his acinetoabacter PNA to complete  6 week course from date of first negative cultures post line removal  #3 decubitus ulcers mx: I really have  A VERY hard time seeing how he is  going to do well  Mom asked if Plastics could come by, wounds photographed yesterday  #4 FUO: suspect due to VAP and difficultly clearing secretions, will consider Cdiff testing stool with new method if not improving sufficienctly  #5 Goals of care: I would STRONGLY recommend palliative care consult. I am very skeptical about Mr Azizi's long term chances and even his ability to survive this hospitalization      LOS: 26 days   [name] 05/21/2015, 3:43 PM

## 2015-05-22 LAB — GLUCOSE, CAPILLARY
GLUCOSE-CAPILLARY: 149 mg/dL — AB (ref 65–99)
GLUCOSE-CAPILLARY: 175 mg/dL — AB (ref 65–99)
GLUCOSE-CAPILLARY: 217 mg/dL — AB (ref 65–99)
Glucose-Capillary: 166 mg/dL — ABNORMAL HIGH (ref 65–99)

## 2015-05-22 LAB — MAGNESIUM: MAGNESIUM: 1.5 mg/dL — AB (ref 1.7–2.4)

## 2015-05-22 LAB — GENTAMICIN LEVEL, PEAK: Gentamicin Pk: 7.2 ug/mL (ref 5.0–10.0)

## 2015-05-22 NOTE — Progress Notes (Signed)
Patient refused CPT 0000.

## 2015-05-22 NOTE — Progress Notes (Signed)
Patient refused CPT 0400

## 2015-05-22 NOTE — Progress Notes (Addendum)
PULMONARY / CRITICAL CARE MEDICINE   Name: GROVE DEFINA MRN: [id number] DOB: 04-Apr-1986    ADMISSION DATE: 04/25/2015 TODAY'S DATE: 05/10/2015  REFERRING MD : TRH  CHIEF COMPLAINT: Hypotension  INITIAL PRESENTATION: 29 year old male quad with MRSA bacteremia from multiple sources. Concern for endocarditis. Wounds have been checked and followed by general surgery and ortho. Patient has history of hypotension and concern for septic shock and levophed was started and PCCM consulted. 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 ?  HCAP.   SIGNIFICANT EVENTS: 7/28 >> OR for debridement of osteomyelitis and abscess cx with staph. Positive MRSA and strep blood cx 7/28 >> Reconsulted for extensive post op bleeding and hypotension 7/31 >> worsening respiratory status, acidosis, hypoxemia, hypotension overnight  STUDIES: 7/28 TEE >> NO vegetation, normal LV function  7/31 Bronch >> HCAP, extensive mucous plug 8/2 bronch - no sig obstruction 8/2- family does not want trach at this point even though is recommended 8/3 bronch lungs open, clear, extubated, re intubated lat evening due to collapse 8/4- pt changed decision, trach placed 8/8 weaned 10+ hours  SUBJECTIVE/OVERNIGHT:    nad on vent/ no new concerns per nursing/ ID note reviewed and strongly rec palliative care eval   VITAL SIGNS: Temp:  [98.8 F (37.1 C)-101.8 F (38.8 C)] 99.6 F (37.6 C) (08/14 0800) Pulse Rate:  [94-111] 103 (08/14 0800) Resp:  [14-24] 16 (08/14 0800) BP: (106-138)/(73-92) 110/74 mmHg (08/14 0800) SpO2:  [100 %] 100 % (08/14 0800) FiO2 (%):  [40 %] 40 % (08/14 0759) Weight:  [65.318 kg (144 lb)] 65.318 kg (144 lb) (08/14 0500) HEMODYNAMICS:   VENTILATOR SETTINGS: Vent Mode:  [-] PRVC FiO2 (%):  [40 %] 40 % Set Rate:  [16 bmp] 16 bmp Vt Set:  [570 mL] 570 mL PEEP:  [5 cmH20] 5 cmH20 Plateau Pressure:  [22 cmH20-27 cmH20] 27 cmH20 INTAKE /  OUTPUT:  Intake/Output Summary (Last 24 hours) at 05/22/15 0926 Last data filed at 05/22/15 0700  Gross per 24 hour  Intake 2427.42 ml  Output   3100 ml  Net -672.58 ml    PHYSICAL EXAMINATION:  Gen: chronically ill appearing, sleepy on vent HENT: trach clean, dry PULM: vent supported breaths, minimal bilateral insp / exp rhonchi CV: RRR, no mgr, trace edema arms, feet GI: BS+, soft, nontender Derm:  sacral wound stage 4 plus Neuro:  Sleepy but arouses to verbal, nods approp   LABS:  CBC  Recent Labs Lab 05/18/15 0251 05/20/15 0244 05/21/15 0326  WBC 35.4* 35.0* 33.2*  HGB 7.0* 6.4* 7.9*  HCT 21.3* 19.8* 23.3*  PLT 206 219 306   Coag's  Recent Labs Lab 05/20/15 0244  INR 4.44*   BMET  Recent Labs Lab 05/18/15 0251 05/20/15 0244 05/21/15 0326  NA 143 141 141  K 4.2 4.4 3.9  CL 116* 112* 114*  CO2 22 21* 21*  BUN 17 16 15   CREATININE 0.40* 0.42* 0.47*  GLUCOSE 229* 129* 194*   Electrolytes  Recent Labs Lab 05/18/15 0251  05/20/15 0244 05/21/15 0326 05/22/15 0348  CALCIUM 7.1*  --  7.2* 7.3*  --   MG 1.8  < > 1.5* 1.5* 1.5*  PHOS  --   --  1.5*  --   --   < > = values in this interval not displayed. Sepsis Markers No results for input(s): LATICACIDVEN, PROCALCITON, O2SATVEN in the last 168 hours. ABG No results for input(s): PHART, PCO2ART,  PO2ART in the last 168 hours. Liver Enzymes No results for input(s): AST, ALT, ALKPHOS, BILITOT, ALBUMIN in the last 168 hours. Cardiac Enzymes No results for input(s): TROPONINI, PROBNP in the last 168 hours. Glucose  Recent Labs Lab 05/21/15 1308 05/21/15 1713 05/21/15 2036 05/22/15 0024 05/22/15 0439 05/22/15 0907  GLUCAP 222* 169* 135* 149* 166* 217*   Imaging  I personally reviewed images and agree with radiology impression as follows:  CXR:  05/21/15 Improved bilateral lung opacities are noted consistent with improving pneumonia or edema. Stable support apparatus.   ASSESSMENT /  PLAN:  PULMONARY A:  Acute hypoxemia respiratory failure > due to pulm edema vs HCAP with atelectasis  Pleural effusion R base, small Collapse rt base RML, RLL> improved post bronch   Trach 8/5 >> P:  PS as able but do not suspect liberation from vent. Chest PT. In-line PMV - will likely be prolonged wean.      CARDIOVASCULAR Right Arm PICC 7/19>>> A:  Shock resolved Relative adrenal insufficiency P:  Observe off steroids Continue Midodrine 20mg  PO TID  RENAL A:  Hypomag Hypokalemia > improving P:  Monitor BMET and UOP intermittently. Replace electrolytes as needed  GASTROINTESTINAL OGT A:  Severe malnutrition P:  Zofran PRN nausea PPI  Cont TF IR consulted for g-tube placement.  HEMATOLOGIC A:  Anemia - due to blood loss from sacral wound and severe peri op bleeding from debridement, dilution DVT prevention Persistent leukocytosis May 2016 PE IVC filter placed 8/6  Recent Labs Lab 05/18/15 0251 05/20/15 0244 05/21/15 0326  HGB 7.0* 6.4* 7.9*    P:  Trend CBC in am  SCD Monitor for bleeding Transfused 1 unit pRBC 8/12> approp response 8/13 noted   INFECTIOUS A:  MRSA bacteremia, neg TEE, likely osteomyelitis source E coli UTI  New HCAP likely Once finish abx course, need to remove all lines  MDR acinatobacter P:  BCx2 7/18 >>  MRSA BCx2 7/21 >>MRSA, strep Wound/tissue culture 7/28 >>  Bronch bal 7/31 >> MDR acint  BAL 8/4>>>few GNR>>>acinetobacter   Vanc 7/18 >   8/11 Aztreonam 7/18>>>8/3 8/1 cipro>>>8/3 8/3 gent   ID following /Rx per ID >  I concur with Dr Drucilla Schmidt but since no fm present on 8/13 and I have not been involved in his care previously will ask the primary pccm team to address EOL issues here.    ENDOCRINE A:  Possible adrenal insufficiency  T1 DM hypoglycemia P:  SSI Observe off steroids    NEUROLOGIC A: Quad post trauma Post op pain  Acute encephalopathy 7/31 > improved P:   RASS goal 0 Continue fent patch & fent prn remeron qhs  rx yrica     No fm at bedside   Christinia Gully, MD Pulmonary and [place] [phone] After 5:30 PM or weekends, call [phone]

## 2015-05-23 DIAGNOSIS — J9610 Chronic respiratory failure, unspecified whether with hypoxia or hypercapnia: Secondary | ICD-10-CM

## 2015-05-23 DIAGNOSIS — Z930 Tracheostomy status: Secondary | ICD-10-CM

## 2015-05-23 DIAGNOSIS — E1041 Type 1 diabetes mellitus with diabetic mononeuropathy: Secondary | ICD-10-CM

## 2015-05-23 LAB — GLUCOSE, CAPILLARY
GLUCOSE-CAPILLARY: 150 mg/dL — AB (ref 65–99)
GLUCOSE-CAPILLARY: 155 mg/dL — AB (ref 65–99)
GLUCOSE-CAPILLARY: 166 mg/dL — AB (ref 65–99)
GLUCOSE-CAPILLARY: 186 mg/dL — AB (ref 65–99)
GLUCOSE-CAPILLARY: 221 mg/dL — AB (ref 65–99)
Glucose-Capillary: 214 mg/dL — ABNORMAL HIGH (ref 65–99)
Glucose-Capillary: 103 mg/dL — ABNORMAL HIGH (ref 65–99)
Glucose-Capillary: 255 mg/dL — ABNORMAL HIGH (ref 65–99)

## 2015-05-23 LAB — CBC
HEMATOCRIT: 25.5 % — AB (ref 39.0–52.0)
Hemoglobin: 8.2 g/dL — ABNORMAL LOW (ref 13.0–17.0)
MCH: 29.4 pg (ref 26.0–34.0)
MCHC: 32.2 g/dL (ref 30.0–36.0)
MCV: 91.4 fL (ref 78.0–100.0)
PLATELETS: 460 10*3/uL — AB (ref 150–400)
RBC: 2.79 MIL/uL — ABNORMAL LOW (ref 4.22–5.81)
RDW: 15.3 % (ref 11.5–15.5)
WBC: 16.9 10*3/uL — AB (ref 4.0–10.5)

## 2015-05-23 LAB — BASIC METABOLIC PANEL
ANION GAP: 7 (ref 5–15)
BUN: 13 mg/dL (ref 6–20)
CALCIUM: 7.6 mg/dL — AB (ref 8.9–10.3)
CO2: 23 mmol/L (ref 22–32)
CREATININE: 0.38 mg/dL — AB (ref 0.61–1.24)
Chloride: 108 mmol/L (ref 101–111)
Glucose, Bld: 235 mg/dL — ABNORMAL HIGH (ref 65–99)
Potassium: 4.3 mmol/L (ref 3.5–5.1)
SODIUM: 138 mmol/L (ref 135–145)

## 2015-05-23 LAB — CREATININE, SERUM
CREATININE: 0.41 mg/dL — AB (ref 0.61–1.24)
GFR calc Af Amer: >60 mL/min (ref >60)

## 2015-05-23 LAB — GENTAMICIN LEVEL, TROUGH: Gentamicin Trough: 2.3 ug/mL (ref 0.5–2.0)

## 2015-05-23 LAB — LACTIC ACID, PLASMA: LACTIC ACID, VENOUS: 0.8 mmol/L (ref 0.5–2.0)

## 2015-05-23 LAB — MAGNESIUM: Magnesium: 1.5 mg/dL — ABNORMAL LOW (ref 1.7–2.4)

## 2015-05-23 LAB — PHOSPHORUS: Phosphorus: 1.6 mg/dL — ABNORMAL LOW (ref 2.5–4.6)

## 2015-05-23 LAB — PROCALCITONIN: PROCALCITONIN: 2.35 ng/mL

## 2015-05-23 MED ORDER — MAGNESIUM SULFATE 4 GM/100ML IV SOLN
4.0000 g | Freq: Once | INTRAVENOUS | Status: AC
  Administered 2015-05-23: 4 g via INTRAVENOUS
  Filled 2015-05-23 (×2): qty 100

## 2015-05-23 MED ORDER — LORAZEPAM 2 MG/ML IJ SOLN
0.5000 mg | Freq: Four times a day (QID) | INTRAMUSCULAR | Status: DC | PRN
  Administered 2015-05-23 – 2015-05-29 (×4): 0.5 mg via INTRAVENOUS
  Filled 2015-05-23 (×4): qty 1

## 2015-05-23 MED ORDER — GENTAMICIN SULFATE 40 MG/ML IJ SOLN
120.0000 mg | INTRAVENOUS | Status: DC
  Filled 2015-05-23: qty 3

## 2015-05-23 MED ORDER — SODIUM CHLORIDE 0.9 % IV SOLN
500.0000 mg | Freq: Three times a day (TID) | INTRAVENOUS | Status: DC
  Administered 2015-05-23 – 2015-05-30 (×20): 500 mg via INTRAVENOUS
  Filled 2015-05-23 (×22): qty 500

## 2015-05-23 NOTE — Progress Notes (Signed)
Called to offices [facility] for consult on changing and following supra pubic catheter  and Dr Leafy Ro office  for Plastics consult. Left messages with office personnel to have those DR's page Dr Chase Caller personnel cell phone Ethelda Chick  He will initiate consults.

## 2015-05-23 NOTE — Progress Notes (Addendum)
Speech Language Pathology Treatment: Nada Boozer Speaking valve  Patient Details Name: Jason [name] MRN: [id number] DOB: Dec 28, 1985 Today's Date: 05/23/2015 Time: 0321-2248 SLP Time Calculation (min) (ACUTE ONLY): 74 min  Assessment / Plan / Recommendation Clinical Impression  Pt seen for PMSV placement, currently on trach collar. Upon arrival, pt was anxious about use of valve and being off ventilator, however after repositioning and suctioning by RN, pt became more relaxed. With cuff deflation and PMSV placement, pt appeared to be further calmed by his ability to more easily communicate his wants/needs. Pt wore the valve for 42 min under direct SLP supervision with good phonation and tolerance. No evidence of air trapping noted. SLP provided hands-on training to mother regarding valve placement, maintenance, and monitoring. Upon SLP departure, pt's VS were stable and he was requesting to leave valve in place. Would wear intermittently throughout the day as tolerated under full supervision from staff and/or mother. SLP to follow.  Recommend MD consider FEES to assess swallow function prior to discharge.   HPI Other Pertinent Information: 29 year old male quad with MRSA bacteremia from multiple sources. Concern for endocarditis. Wounds have been checked and followed by general surgery and ortho. Patient has history of hypotension and concern for septic shock and levophed was started and PCCM consulted. 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 Now with possible HCAP. Trach placed 05/12/15.   Pertinent Vitals Pain Assessment: Faces Faces Pain Scale: Hurts even more Pain Location: points to his shoulder Pain Intervention(s): RN gave pain meds during session;Monitored during session  SLP Plan  Continue with current plan of care    Recommendations     Patient may use Passy-Muir Speech Valve: Intermittently with supervision;Caregiver trained to  provide supervision PMSV Supervision: Full       Follow up Recommendations: Home health SLP;24 hour supervision/assistance Plan: Continue with current plan of care    Germain Osgood, M.A. CCC-SLP [phone]  Germain Osgood 05/23/2015, 12:13 PM

## 2015-05-23 NOTE — Progress Notes (Signed)
PULMONARY / CRITICAL CARE MEDICINE   Name: Jason [name] MRN: [id number] DOB: 08/16/1986    ADMISSION DATE: 04/25/2015 TODAY'S DATE: 05/10/2015  REFERRING MD : TRH  CHIEF COMPLAINT: Hypotension  INITIAL PRESENTATION: 29 year old male quad with MRSA bacteremia from multiple sources. Concern for endocarditis. Wounds have been checked and followed by general surgery and ortho. Patient has history of hypotension and concern for septic shock and levophed was started and PCCM consulted. 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 ?  HCAP.   SIGNIFICANT EVENTS: 7/28 >> OR for debridement of osteomyelitis and abscess cx with staph. Positive MRSA and strep blood cx 7/28 >> Reconsulted for extensive post op bleeding and hypotension 7/31 >> worsening respiratory status, acidosis, hypoxemia, hypotension overnight  STUDIES: 7/28 TEE >> NO vegetation, normal LV function  7/31 Bronch >> HCAP, extensive mucous plug 8/2 bronch - no sig obstruction 8/2- family does not want trach at this point even though is recommended 8/3 bronch lungs open, clear, extubated, re intubated lat evening due to collapse 8/4- pt changed decision, trach placed 8/8 weaned 10+ hours 05/22/15: nad on vent/ no new concerns per nursing/ ID note reviewed and strongly rec palliative care eval    SUBJECTIVE/OVERNIGHT/INTERVAL HX 05/23/15: Nursing reports no overnight issues. Mom has several questions  1. Status of Dr Migdalia Dk debridement of wounds  2. Suprapubic time for change and is leaking - Dr Nicki Reaper McDiarmid usually addresses  3. Baseline temp is 74F. But curently feeling hot and temp 101F 2d ago and now 9F x few days  4. Discharge planning   VITAL SIGNS: Temp:  [98.4 F (36.9 C)-100.2 F (37.9 C)] 98.6 F (37 C) (08/15 0859) Pulse Rate:  [86-112] 95 (08/15 0859) Resp:  [16-34] 25 (08/15 0859) BP: (103-139)/(63-110) 107/71 mmHg (08/15 0859) SpO2:  [100 %] 100 %  (08/15 0859) FiO2 (%):  [40 %] 40 % (08/15 0753) Weight:  [59.603 kg (131 lb 6.4 oz)] 59.603 kg (131 lb 6.4 oz) (08/15 0700) HEMODYNAMICS:   VENTILATOR SETTINGS: Vent Mode:  [-] PRVC FiO2 (%):  [40 %] 40 % Set Rate:  [16 bmp] 16 bmp Vt Set:  [570 mL] 570 mL PEEP:  [5 cmH20] 5 cmH20 Pressure Support:  [8 cmH20] 8 cmH20 Plateau Pressure:  [22 cmH20-26 cmH20] 22 cmH20 INTAKE / OUTPUT:  Intake/Output Summary (Last 24 hours) at 05/23/15 0942 Last data filed at 05/23/15 0858  Gross per 24 hour  Intake 3010.75 ml  Output   6450 ml  Net -3439.25 ml    PHYSICAL EXAMINATION:  Gen: chronically ill appearing, sleepy on vent HENT: trach clean, dry PULM: vent supported breaths, minimal bilateral insp / exp rhonchi CV: RRR, no mgr, trace edema arms, feet GI: BS+, soft, nontender Derm:  sacral wound stage 4 plus Neuro:  Sleepy but arouses to verbal, nods approp   LABS:  PULMONARY No results for input(s): PHART, PCO2ART, PO2ART, HCO3, TCO2, O2SAT in the last 168 hours.  Invalid input(s): PCO2, PO2  CBC  Recent Labs Lab 05/18/15 0251 05/20/15 0244 05/21/15 0326  HGB 7.0* 6.4* 7.9*  HCT 21.3* 19.8* 23.3*  WBC 35.4* 35.0* 33.2*  PLT 206 219 306    COAGULATION  Recent Labs Lab 05/20/15 0244  INR 4.44*    CARDIAC  No results for input(s): TROPONINI in the last 168 hours. No results for input(s): PROBNP in the last 168 hours.   CHEMISTRY  Recent Labs Lab 05/17/15 0450 05/18/15 0251  05/19/15 0430 05/20/15 0244 05/21/15 0326 05/22/15 0348 05/23/15 0251  NA 141 143  --  141 141  --   --   K 3.9 4.2  --  4.4 3.9  --   --   CL 114* 116*  --  112* 114*  --   --   CO2 22 22  --  21* 21*  --   --   GLUCOSE 148* 229*  --  129* 194*  --   --   BUN 15 17  --  16 15  --   --   CREATININE 0.38* 0.40*  --  0.42* 0.47*  --  0.41*  CALCIUM 7.0* 7.1*  --  7.2* 7.3*  --   --   MG 1.3* 1.8 1.7 1.5* 1.5* 1.5* 1.5*  PHOS  --   --   --  1.5*  --   --   --    Estimated  Creatinine Clearance: 113.8 mL/min (by C-G formula based on Cr of 0.41).   LIVER  Recent Labs Lab 05/20/15 0244  INR 4.44*     INFECTIOUS No results for input(s): LATICACIDVEN, PROCALCITON in the last 168 hours.   ENDOCRINE CBG (last 3)   Recent Labs  05/23/15 0002 05/23/15 0436 05/23/15 0903  GLUCAP 150* 186* 155*         IMAGING x48h  - image(s) personally visualized  -   highlighted in bold No results found.       ASSESSMENT / PLAN:  PULMONARY A:  Acute hypoxemia respiratory failure > due to pulm edema vs HCAP with atelectasis  Pleural effusion R base, small Collapse rt base RML, RLL> improved post bronch   Trach 8/5 >>   - continued ATC in AM and vent night per mom   P:  PS as able but do not suspect liberation from vent. Chest PT. In-line PMV - will likely be prolonged wean.      CARDIOVASCULAR Right Arm PICC 7/19>>> A:  Shock resolved Relative adrenal insufficiency   - not on pressors  P:  Observe off steroids Continue Midodrine 20mg  PO TID  RENAL A:  Hypomag Leaking supra-pubic foley   P:  Have asked secy. To page urology for me 05/23/2015  Mag sulfate replacement Monitor BMET - check now - and UOP intermittently. Replace electrolytes as needed  GASTROINTESTINAL OGT A:  Severe malnutrition P:  Zofran PRN nausea PPI  Cont TF IR consulted for g-tube placement.  HEMATOLOGIC A:  Anemia - due to blood loss from sacral wound and severe peri op bleeding from debridement, dilution DVT prevention Persistent leukocytosis May 2016 PE IVC filter placed 8/6  Recent Labs Lab 05/18/15 0251 05/20/15 0244 05/21/15 0326  HGB 7.0* 6.4* 7.9*    P:  Trend CBC in am  SCD Monitor for bleeding Transfused 1 unit pRBC 8/12> approp response 8/13 noted   INFECTIOUS  BCx2 7/18 >>  MRSA BCx2 7/21 >>MRSA, strep Wound/tissue culture 7/28 >>  Bronch bal 7/31 >> MDR acint  BAL 8/4>>>few  GNR>>>acinetobacter   A:  MRSA bacteremia, neg TEE, likely osteomyelitis source E coli UTI  New HCAP likely Once finish abx course, need to remove all lines  MDR acinatobacter Per ID ID following /Rx per ID >  I concur with Dr Drucilla Schmidt but since no fm present on 8/13 and I have not been involved in his care previously will ask the primary pccm team to address EOL issues here.      -  feeling hot and running fever.   P:   Vanc 7/18 >   8/11 Aztreonam 7/18>>>8/3 8/1 cipro>>>8/3 8/3 gent    Check sepsis biomarkers 05/23/2015   ENDOCRINE A:  Possible adrenal insufficiency  T1 DM hypoglycemia P:  SSI Observe off steroids    NEUROLOGIC A: Quad post trauma Post op pain  Acute encephalopathy 7/31 > improved  - no change  P:  RASS goal 0 Continue fent patch & fent prn remeron qhs  rx yrica     DERMS A: stage 4 sacral decub P  - Dr Migdalia Dk to be recalled per mom   Mom updated 05/23/2015     Dr. Brand Males, M.D., F.C.C.P Pulmonary and Critical Care Medicine Staff Physician [place] [facility] Pager: [phone], If no answer or between  15:00h - 7:00h: call [phone]  05/23/2015 9:42 AM

## 2015-05-23 NOTE — Progress Notes (Signed)
Dr Mackie Pai signed form to start arranging home vent w dme company, faxed signed form to dme provider.

## 2015-05-23 NOTE — Consult Note (Signed)
WOC will sign off at this time, plastic surgery is following this patient.  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   Re consult if needed, will not follow at this time. Thanks  Benedetta Sundstrom [place], [place] [phone])

## 2015-05-23 NOTE — Progress Notes (Signed)
Patient refused CPT 0400

## 2015-05-23 NOTE — Progress Notes (Signed)
ANTIBIOTIC CONSULT NOTE  Pharmacy Consult for Imipenem + Linezolid + Gentamicin Indication: VAP/MRSA bacteremia   Assessment: 29 yo m with complicated infectious diseases course.  Patient is wheelchair bound with multiple medical problems.  He is currently being treated for MRD Acinetobacter VAP and MRSA/microaerophilic strep bacteremia/osteomyelitis.   Patient has multiple antibiotic allergies documented, but some are not true allergies.  He has confusion associated with ertapenem and apparent anaphylaxis to penicillins but can take amoxicillin.  On 8/12 after discussion with Dr. Tommy Medal, the patient, and the patient's mother, he was switched to gentamicin (traditional dosing) and imipenem for his VAP, and linezolid for his MRSA/strep bacteremia and osteomyelitis.   Per Dr. Derek Mound note today, to continue Primaxin and gentamicin through 8/25 for a full 14 day course, then stop. Linezolid is to continue until restarting vancomycin on 8/26 which will be given through 9/18.  WBC 16.9, tmax 100.2. plts 460, hgb 8.2- has received transfusions. SCr 0.38, UOP 56ml/kg/hr in the past 24h.  A gentamicin trough and peak were ordered for today. The trough was drawn at 1245 and resulted back high at 2.31mcg/mL. The peak was unable to be drawn as patient is a difficult stick and phlebotomy was unable to obtain a sample within a time frame that was conducive to kinetic calculations.  Noted patient's weights have been very erratic this admission- for example, his weight on admission on 7/18 was documented as 110lb, and then drastically increased to 130lb on 7/22. On 8/12 when Primaxin was started, it was documented he weighed 167lb, and today weight  drastically decreased to 131lb. Patient has not been on routine diuretics and while some weight changes are expected during a critical hospitalization, these are very drastic.  Vancomycin (bacteremia) 7/18 >>8/12 Aztreonam 7/18 >>7/30; Resumed 7/31 > 8/3 Cipro  8/1 >> 8/2 Tigecycline (wound infxn) 8/3 >>8/12 Gentamicin (pna) 8/4 >>8/9; 8/12 >> Imipenem 8/12 >> Linezolid 8/12 >>  7/18 blood: 2/2 MRSA, microaerophilic strep 4/27 urine: >100k E.coli (pansensitive except Cipro; sensitive to aztreonam)  7/18 MRSA PCR: positive 7/21 blood: 1/2 microaerophilic strep 0/62 blood: neg final 7/31 Trach asp: Acinetobacter (S Unasyn, gent, imi, tobra. MIC 1 for tigecycline- should be sens, 0.0125 for Colistin) 8/4 Resp (lavage): acinetobacter 8/7 blood x 2: NG -final 8/10 trach asp: acinetobacter - gent MIC = 4, rising.  Goal of Therapy:  Gentamicin trough level <2 mcg/ml  Eradication of infection  Plan:  - Reduce Primaxin to 500mg  IV q8h with decrease in weight. - reduce gentamicin to 120mg  IV q24h with next dose due 8/16 at 1500 - both gentamicin and Primaxin have had stop dates of 8/25 entered as per Dr. Derek Mound note  - Monitor renal function closely while on gentamicin - will attempt a gentamicin peak again to ensure q24h dosing is appropriate - Linezolid 600 mg IV q12h - Monitor platelets while on linezolid - NO stop date has been entered for linezold (see above, will need orders to restart vancomycin on 8/26) - F/u cultures, CBC, clinical course, changes to ID plans   Jason [name], PharmD, BCPS Clinical Pharmacist Pager: [phone] 05/23/2015 5:41 PM

## 2015-05-23 NOTE — Progress Notes (Signed)
[facility]    Subjective:  Pt feeling better today and off of ventilator   Antibiotics:  Anti-infectives    Start     Dose/Rate Route Frequency Ordered Stop   05/20/15 1300  linezolid (ZYVOX) IVPB 600 mg     600 mg 300 mL/hr over 60 Minutes Intravenous Every 12 hours 05/20/15 1218     05/20/15 1300  imipenem-cilastatin (PRIMAXIN) 500 mg in sodium chloride 0.9 % 100 mL IVPB     500 mg 200 mL/hr over 30 Minutes Intravenous 4 times per day 05/20/15 1218     05/20/15 1300  gentamicin (GARAMYCIN) 190 mg in dextrose 5 % 50 mL IVPB     2.5 mg/kg  75.8 kg 109.5 mL/hr over 30 Minutes Intravenous Every 24 hours 05/20/15 1218     05/18/15 0030  vancomycin (VANCOCIN) IVPB 1000 mg/200 mL premix  Status:  Discontinued     1,000 mg 200 mL/hr over 60 Minutes Intravenous Every 24 hours 05/18/15 0017 05/20/15 1218   05/14/15 0029  vancomycin (VANCOCIN) IVPB 750 mg/150 ml premix  Status:  Discontinued     750 mg 150 mL/hr over 60 Minutes Intravenous Every 24 hours 05/14/15 0029 05/18/15 0017   05/13/15 1000  gentamicin (GARAMYCIN) 520 mg in dextrose 5 % 100 mL IVPB  Status:  Discontinued     7 mg/kg  74.5 kg 113 mL/hr over 60 Minutes Intravenous Every 48 hours 05/12/15 0044 05/17/15 1403   05/12/15 0000  tigecycline (TYGACIL) 50 mg in sodium chloride 0.9 % 100 mL IVPB  Status:  Discontinued     50 mg 200 mL/hr over 30 Minutes Intravenous Every 12 hours 05/11/15 0932 05/20/15 1218   05/11/15 1200  tigecycline (TYGACIL) 100 mg in sodium chloride 0.9 % 100 mL IVPB     100 mg 200 mL/hr over 30 Minutes Intravenous  Once 05/11/15 0932 05/11/15 1443   05/11/15 1015  gentamicin (GARAMYCIN) 520 mg in dextrose 5 % 100 mL IVPB  Status:  Discontinued     7 mg/kg  74.5 kg 113 mL/hr over 60 Minutes Intravenous Every 24 hours 05/11/15 1000 05/12/15 0043   05/09/15 0930  ciprofloxacin (CIPRO) IVPB 400 mg  Status:  Discontinued     400 mg 200 mL/hr over 60  Minutes Intravenous Every 12 hours 05/09/15 0904 05/11/15 0823   05/08/15 0900  aztreonam (AZACTAM) 2 g in dextrose 5 % 50 mL IVPB  Status:  Discontinued     2 g 100 mL/hr over 30 Minutes Intravenous 3 times per day 05/08/15 0826 05/11/15 0823   05/06/15 0000  vancomycin (VANCOCIN) IVPB 750 mg/150 ml premix  Status:  Discontinued     750 mg 150 mL/hr over 60 Minutes Intravenous Every 48 hours 05/05/15 2357 05/14/15 0030   04/28/15 2000  vancomycin (VANCOCIN) IVPB 750 mg/150 ml premix  Status:  Discontinued     750 mg 150 mL/hr over 60 Minutes Intravenous Every 24 hours 04/28/15 0731 05/04/15 1932   04/26/15 0800  vancomycin (VANCOCIN) IVPB 750 mg/150 ml premix  Status:  Discontinued     750 mg 150 mL/hr over 60 Minutes Intravenous Every 12 hours 04/25/15 1715 04/28/15 0731   04/26/15 0200  aztreonam (AZACTAM) 1 g in dextrose 5 % 50 mL IVPB  Status:  Discontinued     1 g 100 mL/hr over 30 Minutes Intravenous Every 8 hours 04/25/15 1919 05/07/15 1035  04/25/15 1715  vancomycin (VANCOCIN) IVPB 750 mg/150 ml premix     750 mg 150 mL/hr over 60 Minutes Intravenous STAT 04/25/15 1715 04/25/15 1906   04/25/15 1645  aztreonam (AZACTAM) 2 g in dextrose 5 % 50 mL IVPB     2 g 100 mL/hr over 30 Minutes Intravenous  Once 04/25/15 1643 04/25/15 1807      Medications: Scheduled Meds: . sodium chloride   Intravenous Once  . albuterol  2.5 mg Nebulization Q4H  . antiseptic oral rinse  7 mL Mouth Rinse QID  . baclofen  10 mg Oral BID WC  . baclofen  20 mg Oral QHS  . chlorhexidine gluconate  15 mL Mouth Rinse BID  . fentaNYL  50 mcg Transdermal Q72H  . gentamicin  2.5 mg/kg Intravenous Q24H  . imipenem-cilastatin  500 mg Intravenous 4 times per day  . insulin aspart  0-15 Units Subcutaneous 6 times per day  . linezolid  600 mg Intravenous Q12H  . loratadine  10 mg Oral QHS  . midodrine  20 mg Oral TID WC  . mirtazapine  7.5 mg Oral QHS  . pantoprazole sodium  40 mg Per Tube Daily  .  pregabalin  100 mg Oral TID AC  . pregabalin  200 mg Oral QHS  . sodium chloride  3 mL Intracatheter Q12H   Continuous Infusions: . sodium chloride 10 mL/hr at 05/23/15 1346  . feeding supplement (VITAL AF 1.2 CAL) 1,000 mL (05/22/15 1804)   PRN Meds:.acetaminophen **OR** acetaminophen, albuterol, albuterol, fentaNYL (SUBLIMAZE) injection, LORazepam, ondansetron, oxyCODONE, sodium chloride    Objective: Weight change: -12 lb 9.6 oz (-5.715 kg)  Intake/Output Summary (Last 24 hours) at 05/23/15 1421 Last data filed at 05/23/15 1225  Gross per 24 hour  Intake   2659 ml  Output   7350 ml  Net  -4691 ml   Blood pressure 96/63, pulse 87, temperature 97.9 F (36.6 C), temperature source Axillary, resp. rate 23, height 5\' 8"  (1.727 m), weight 131 lb 6.4 oz (59.603 kg), SpO2 100 %. Temp:  [97.9 F (36.6 C)-100.2 F (37.9 C)] 97.9 F (36.6 C) (08/15 1142) Pulse Rate:  [86-106] 87 (08/15 1249) Resp:  [16-32] 23 (08/15 1249) BP: (96-139)/(62-110) 96/63 mmHg (08/15 1249) SpO2:  [100 %] 100 % (08/15 1249) FiO2 (%):  [40 %] 40 % (08/15 1249) Weight:  [131 lb 6.4 oz (59.603 kg)] 131 lb 6.4 oz (59.603 kg) (08/15 0700)  Physical Exam: General: Alert and awake, oriented  HEENT: anicteric sclera, pupils reactive to light and accommodation, EOMI, tracheostomy CVS tachy rate, normal r,  no murmur rubs or gallops Chest much better aeration Abdomen: soft , nondistended, ostomy bag with signficant stool, urine clean  Neuro: quadriplegic  Skin: not examined thoroughly today see prior note  CBC: CBC Latest Ref Rng 05/23/2015 05/21/2015 05/20/2015  WBC 4.0 - 10.5 K/uL 16.9(H) 33.2(H) 35.0(H)  Hemoglobin 13.0 - 17.0 g/dL 8.2(L) 7.9(L) 6.4(LL)  Hematocrit 39.0 - 52.0 % 25.5(L) 23.3(L) 19.8(L)  Platelets 150 - 400 K/uL 460(H) 306 219     BMET  Recent Labs  05/21/15 0326 05/23/15 0251 05/23/15 1245  NA 141  --  138  K 3.9  --  4.3  CL 114*  --  108  CO2 21*  --  23  GLUCOSE 194*  --   235*  BUN 15  --  13  CREATININE 0.47* 0.41* 0.38*  CALCIUM 7.3*  --  7.6*     Liver Panel  No results for input(s): PROT, ALBUMIN, AST, ALT, ALKPHOS, BILITOT, BILIDIR, IBILI in the last 72 hours.     Sedimentation Rate No results for input(s): ESRSEDRATE in the last 72 hours. C-Reactive Protein No results for input(s): CRP in the last 72 hours.  Micro Results: Recent Results (from the past 720 hour(s))  Urine culture     Status: None   Collection Time: 04/25/15  4:35 PM  Result Value Ref Range Status   Specimen Description URINE, CLEAN CATCH  Final   Special Requests Normal  Final   Culture   Final    >=100,000 COLONIES/mL ESCHERICHIA COLI AZTREONAM SENSITIVE ZONE 32MM Performed at [facility] Performed at [facility]    Report Status 05/02/2015 FINAL  Final   Organism ID, Bacteria ESCHERICHIA COLI  Final      Susceptibility   Escherichia coli - MIC*    AMPICILLIN <=2 SENSITIVE Sensitive     CEFAZOLIN <=4 SENSITIVE Sensitive     CEFTRIAXONE <=1 SENSITIVE Sensitive     CIPROFLOXACIN >=4 RESISTANT Resistant     GENTAMICIN <=1 SENSITIVE Sensitive     IMIPENEM <=0.25 SENSITIVE Sensitive     NITROFURANTOIN <=16 SENSITIVE Sensitive     TRIMETH/SULFA <=20 SENSITIVE Sensitive     AMPICILLIN/SULBACTAM <=2 SENSITIVE Sensitive     PIP/TAZO <=4 SENSITIVE Sensitive     * >=100,000 COLONIES/mL ESCHERICHIA COLI  Blood culture (routine x 2)     Status: None   Collection Time: 04/25/15  5:00 PM  Result Value Ref Range Status   Specimen Description BLOOD RIGHT ANTECUBITAL  Final   Special Requests BOTTLES DRAWN AEROBIC AND ANAEROBIC 5 CC EA  Final   Culture  Setup Time   Final    GRAM POSITIVE COCCI IN CLUSTERS Gram Stain Report Called to,Read Back By and Verified With: Elveria Rising RN 17:30 04/26/15 ([email or web address]) IN BOTH AEROBIC AND ANAEROBIC BOTTLES    Culture   Final    METHICILLIN RESISTANT STAPHYLOCOCCUS AUREUS MICROAEROPHILIC STREPTOCOCCI Standardized  susceptibility testing for this organism is not available. Performed at [facility]    Report Status 04/30/2015 FINAL  Final   Organism ID, Bacteria METHICILLIN RESISTANT STAPHYLOCOCCUS AUREUS  Final      Susceptibility   Methicillin resistant staphylococcus aureus - MIC*    CIPROFLOXACIN >=8 RESISTANT Resistant     ERYTHROMYCIN >=8 RESISTANT Resistant     GENTAMICIN <=0.5 SENSITIVE Sensitive     OXACILLIN >=4 RESISTANT Resistant     TETRACYCLINE <=1 SENSITIVE Sensitive     VANCOMYCIN <=0.5 SENSITIVE Sensitive     TRIMETH/SULFA >=320 RESISTANT Resistant     CLINDAMYCIN >=8 RESISTANT Resistant     RIFAMPIN <=0.5 SENSITIVE Sensitive     Inducible Clindamycin NEGATIVE Sensitive     * METHICILLIN RESISTANT STAPHYLOCOCCUS AUREUS  Blood culture (routine x 2)     Status: None   Collection Time: 04/25/15  5:16 PM  Result Value Ref Range Status   Specimen Description BLOOD BLOOD RIGHT FOREARM  Final   Special Requests BOTTLES DRAWN AEROBIC AND ANAEROBIC 5 CC EA  Final   Culture  Setup Time   Final    GRAM POSITIVE COCCI IN CLUSTERS CRITICAL RESULT CALLED TO, READ BACK BY AND VERIFIED WITH: S GRAHAM 04/26/15 @ 61 M VESTAL IN BOTH AEROBIC AND ANAEROBIC BOTTLES    Culture   Final    METHICILLIN RESISTANT STAPHYLOCOCCUS AUREUS SUSCEPTIBILITIES PERFORMED ON PREVIOUS CULTURE WITHIN THE LAST 5 DAYS. MICROAEROPHILIC STREPTOCOCCI Standardized susceptibility testing for  this organism is not available. Performed at [facility]    Report Status 05/03/2015 FINAL  Final  MRSA PCR Screening     Status: Abnormal   Collection Time: 04/25/15  6:55 PM  Result Value Ref Range Status   MRSA by PCR POSITIVE (A) NEGATIVE Final    Comment:        The GeneXpert MRSA Assay (FDA approved for NASAL specimens only), is one component of a comprehensive MRSA colonization surveillance program. It is not intended to diagnose MRSA infection nor to guide or monitor treatment for MRSA  infections. RESULT CALLED TO, READ BACK BY AND VERIFIED WITH: Sharyn Lull REEVES,RN [id number] @ 2043 BY J SCOTTON   Culture, blood (routine x 2)     Status: None   Collection Time: 04/28/15  1:20 PM  Result Value Ref Range Status   Specimen Description BLOOD LEFT ARM  Final   Special Requests IN PEDIATRIC BOTTLE 1CC  Final   Culture  Setup Time   Final    GRAM POSITIVE COCCI IN CLUSTERS AEROBIC BOTTLE ONLY CRITICAL RESULT CALLED TO, READ BACK BY AND VERIFIED WITH: Hetty Blend RN 2222 04/30/15 A BROWNING    Culture   Final    MICROAEROPHILIC STREPTOCOCCI Standardized susceptibility testing for this organism is not available. Performed at [facility]    Report Status 05/03/2015 FINAL  Final  Culture, blood (routine x 2)     Status: None   Collection Time: 04/28/15  5:50 PM  Result Value Ref Range Status   Specimen Description BLOOD PICC LINE  Final   Special Requests BOTTLES DRAWN AEROBIC AND ANAEROBIC 5CC EACH  Final   Culture   Final    NO GROWTH 5 DAYS Performed at [facility]    Report Status 05/03/2015 FINAL  Final  Culture, blood (routine x 2)     Status: None   Collection Time: 05/04/15  6:30 PM  Result Value Ref Range Status   Specimen Description BLOOD PICC LINE  Final   Special Requests BOTTLES DRAWN AEROBIC AND ANAEROBIC 5ML  Final   Culture   Final    NO GROWTH 5 DAYS Performed at [facility]    Report Status 05/09/2015 FINAL  Final  Culture, blood (routine x 2)     Status: None   Collection Time: 05/04/15  6:30 PM  Result Value Ref Range Status   Specimen Description BLOOD PICC LINE  Final   Special Requests BOTTLES DRAWN AEROBIC AND ANAEROBIC 5ML  Final   Culture   Final    NO GROWTH 5 DAYS Performed at [facility]    Report Status 05/09/2015 FINAL  Final  Anaerobic culture     Status: None   Collection Time: 05/05/15  1:01 PM  Result Value Ref Range Status   Specimen Description ABSCESS  Final   Special Requests SPEC A ON SWAB  RIGHT PELVIC ABSCESS  Final   Gram Stain   Final    FEW WBC PRESENT, PREDOMINANTLY MONONUCLEAR NO SQUAMOUS EPITHELIAL CELLS SEEN FEW GRAM POSITIVE COCCI IN PAIRS IN CLUSTERS Performed at [facility]    Culture   Final    NO ANAEROBES ISOLATED Performed at [facility]    Report Status 05/10/2015 FINAL  Final  Culture, routine-abscess     Status: None   Collection Time: 05/05/15  1:01 PM  Result Value Ref Range Status   Specimen Description ABSCESS  Final   Special Requests SPEC A ON  SWAB RIGHT PELVIC ABSCESS  Final   Gram Stain   Final    FEW WBC PRESENT, PREDOMINANTLY MONONUCLEAR NO SQUAMOUS EPITHELIAL CELLS SEEN FEW GRAM POSITIVE COCCI IN PAIRS IN CLUSTERS Performed at [facility]    Culture   Final    MODERATE METHICILLIN RESISTANT STAPHYLOCOCCUS AUREUS Note: RIFAMPIN AND GENTAMICIN SHOULD NOT BE USED AS SINGLE DRUGS FOR TREATMENT OF STAPH INFECTIONS. CRITICAL RESULT CALLED TO, READ BACK BY AND VERIFIED WITH: IREKIA B. 8/1 @216  BY REAMM Performed at [facility]    Report Status 05/09/2015 FINAL  Final   Organism ID, Bacteria METHICILLIN RESISTANT STAPHYLOCOCCUS AUREUS  Final      Susceptibility   Methicillin resistant staphylococcus aureus - MIC*    CLINDAMYCIN >=8 RESISTANT Resistant     ERYTHROMYCIN >=8 RESISTANT Resistant     GENTAMICIN <=0.5 SENSITIVE Sensitive     LEVOFLOXACIN >=8 RESISTANT Resistant     OXACILLIN >=4 RESISTANT Resistant     RIFAMPIN <=0.5 SENSITIVE Sensitive     TRIMETH/SULFA >=320 RESISTANT Resistant     VANCOMYCIN 1 SENSITIVE Sensitive     TETRACYCLINE <=1 SENSITIVE Sensitive     * MODERATE METHICILLIN RESISTANT STAPHYLOCOCCUS AUREUS  Tissue culture     Status: None   Collection Time: 05/05/15  1:13 PM  Result Value Ref Range Status   Specimen Description TISSUE  Final   Special Requests SPEC B IN CUP BONE RIGHT ISCHIUM  Final   Gram Stain   Final    RARE WBC PRESENT, PREDOMINANTLY MONONUCLEAR NO  ORGANISMS SEEN Performed at [facility]    Culture   Final    FEW METHICILLIN RESISTANT STAPHYLOCOCCUS AUREUS Note: RIFAMPIN AND GENTAMICIN SHOULD NOT BE USED AS SINGLE DRUGS FOR TREATMENT OF STAPH INFECTIONS. CRITICAL RESULT CALLED TO, READ BACK BY AND VERIFIED WITH: IREKIA B. 8/1 @216  BY REAMM Performed at [facility]    Report Status 05/09/2015 FINAL  Final   Organism ID, Bacteria METHICILLIN RESISTANT STAPHYLOCOCCUS AUREUS  Final      Susceptibility   Methicillin resistant staphylococcus aureus - MIC*    CLINDAMYCIN >=8 RESISTANT Resistant     ERYTHROMYCIN >=8 RESISTANT Resistant     GENTAMICIN <=0.5 SENSITIVE Sensitive     LEVOFLOXACIN >=8 RESISTANT Resistant     OXACILLIN >=4 RESISTANT Resistant     RIFAMPIN <=0.5 SENSITIVE Sensitive     TRIMETH/SULFA >=320 RESISTANT Resistant     VANCOMYCIN 1 SENSITIVE Sensitive     TETRACYCLINE <=1 SENSITIVE Sensitive     * FEW METHICILLIN RESISTANT STAPHYLOCOCCUS AUREUS  Culture, respiratory (NON-Expectorated)     Status: None   Collection Time: 05/08/15  8:36 AM  Result Value Ref Range Status   Specimen Description TRACHEAL ASPIRATE  Final   Special Requests Normal  Final   Gram Stain   Final    RARE WBC PRESENT, PREDOMINANTLY PMN NO SQUAMOUS EPITHELIAL CELLS SEEN FEW GRAM NEGATIVE RODS RARE GRAM POSITIVE COCCI IN PAIRS RARE YEAST    Culture   Final    ABUNDANT ACINETOBACTER CALCOACETICUS/BAUMANNII COMPLEX Note: TIGECYCLINE  MIC 1 COLISTIN SENSITIVE .0125 ug/mL  ETEST results for this drug are "FOR INVESTIGATIONAL USE ONLY" and should NOT be used for clinical purposes. Performed at [facility]    Report Status 05/12/2015 FINAL  Final   Organism ID, Bacteria ACINETOBACTER CALCOACETICUS/BAUMANNII COMPLEX  Final      Susceptibility   Acinetobacter calcoaceticus/baumannii complex - MIC*    AMPICILLIN >=32 RESISTANT Resistant  AMPICILLIN/SULBACTAM 4 SENSITIVE Sensitive     CEFAZOLIN >=64 RESISTANT  Resistant     CEFTAZIDIME >=64 RESISTANT Resistant     CEFTRIAXONE >=64 RESISTANT Resistant     CIPROFLOXACIN >=4 RESISTANT Resistant     GENTAMICIN <=1 SENSITIVE Sensitive     IMIPENEM 0.5 SENSITIVE Sensitive     PIP/TAZO >=128 RESISTANT Resistant     TOBRAMYCIN <=1 SENSITIVE Sensitive     TRIMETH/SULFA >=320 RESISTANT Resistant     * ABUNDANT ACINETOBACTER CALCOACETICUS/BAUMANNII COMPLEX  Culture, blood (routine x 2)     Status: None   Collection Time: 05/10/15  3:05 PM  Result Value Ref Range Status   Specimen Description Blood  Final   Special Requests Normal  Final   Culture  Setup Time TEST WILL BE CREDITED TEST CANCELLED PER RN   Final   Culture TEST WILL BE CREDITED TEST CANCELLED PER RN   Final   Report Status 05/10/2015 FINAL  Final  Culture, respiratory (NON-Expectorated)     Status: None   Collection Time: 05/12/15 12:20 AM  Result Value Ref Range Status   Specimen Description BRONCHIAL ALVEOLAR LAVAGE  Final   Special Requests NONE  Final   Gram Stain   Final    MODERATE WBC PRESENT,BOTH PMN AND MONONUCLEAR RARE SQUAMOUS EPITHELIAL CELLS PRESENT NO ORGANISMS SEEN Performed at [facility]    Culture   Final    FEW CANDIDA ALBICANS FEW ACINETOBACTER CALCOACETICUS/BAUMANNII COMPLEX Performed at [facility]    Report Status 05/15/2015 FINAL  Final   Organism ID, Bacteria ACINETOBACTER CALCOACETICUS/BAUMANNII COMPLEX  Final      Susceptibility   Acinetobacter calcoaceticus/baumannii complex - MIC*    AMPICILLIN >=32 RESISTANT Resistant     AMPICILLIN/SULBACTAM 4 SENSITIVE Sensitive     CEFAZOLIN >=64 RESISTANT Resistant     CEFTAZIDIME >=64 RESISTANT Resistant     CEFTRIAXONE >=64 RESISTANT Resistant     CIPROFLOXACIN >=4 RESISTANT Resistant     GENTAMICIN <=1 SENSITIVE Sensitive     IMIPENEM 0.5 SENSITIVE Sensitive     PIP/TAZO >=128 RESISTANT Resistant     TOBRAMYCIN <=1 SENSITIVE Sensitive     TRIMETH/SULFA >=320 RESISTANT Resistant      * FEW ACINETOBACTER CALCOACETICUS/BAUMANNII COMPLEX  Culture, blood (routine x 2)     Status: None   Collection Time: 05/15/15  5:30 PM  Result Value Ref Range Status   Specimen Description BLOOD BLOOD LEFT HAND  Final   Special Requests BOTTLES DRAWN AEROBIC ONLY 1CC  Final   Culture NO GROWTH 5 DAYS  Final   Report Status 05/20/2015 FINAL  Final  Culture, blood (routine x 2)     Status: None   Collection Time: 05/15/15  6:22 PM  Result Value Ref Range Status   Specimen Description BLOOD RIGHT FOOT  Final   Special Requests IN PEDIATRIC BOTTLE 3CC  Final   Culture NO GROWTH 5 DAYS  Final   Report Status 05/20/2015 FINAL  Final  Culture, respiratory (NON-Expectorated)     Status: None   Collection Time: 05/18/15  7:46 PM  Result Value Ref Range Status   Specimen Description TRACHEAL ASPIRATE  Final   Special Requests NONE  Final   Gram Stain   Final    ABUNDANT WBC PRESENT,BOTH PMN AND MONONUCLEAR RARE SQUAMOUS EPITHELIAL CELLS PRESENT MODERATE GRAM NEGATIVE COCCOBACILLI RARE GRAM POSITIVE COCCI IN PAIRS    Culture   Final    ABUNDANT ACINETOBACTER CALCOACETICUS/BAUMANNII COMPLEX Performed at [facility]  Report Status 05/21/2015 FINAL  Final   Organism ID, Bacteria ACINETOBACTER CALCOACETICUS/BAUMANNII COMPLEX  Final      Susceptibility   Acinetobacter calcoaceticus/baumannii complex - MIC*    AMPICILLIN >=32 RESISTANT Resistant     AMPICILLIN/SULBACTAM 4 SENSITIVE Sensitive     CEFAZOLIN >=64 RESISTANT Resistant     CEFTAZIDIME >=64 RESISTANT Resistant     CEFTRIAXONE >=64 RESISTANT Resistant     CIPROFLOXACIN >=4 RESISTANT Resistant     GENTAMICIN 4 SENSITIVE Sensitive     IMIPENEM 0.5 SENSITIVE Sensitive     PIP/TAZO >=128 RESISTANT Resistant     TOBRAMYCIN <=1 SENSITIVE Sensitive     TRIMETH/SULFA >=320 RESISTANT Resistant     * ABUNDANT ACINETOBACTER CALCOACETICUS/BAUMANNII COMPLEX    Studies/Results: No results  found.    Assessment/Plan:  Active Problems:   Type 1 diabetes, uncontrolled, with neuropathy   Spinal cord infarction (history of)   Sepsis   Stage IV decubitus ulcer   Urinary tract infectious disease   HCAP (healthcare-associated pneumonia)   Hypotension   Abdominopelvic abscess   Hemorrhagic shock   Acute blood loss anemia   Edema   MRSA bacteremia   Collapse, lung   Pulmonary emboli   Infection due to acinetobacter baumannii   VAP (ventilator-associated pneumonia)   Acute respiratory failure with hypoxemia   Goals of care, counseling/discussion   DVT (deep venous thrombosis)   Severe protein-calorie malnutrition   Noncompliance   Chronic respiratory failure   Tracheostomy status    Jason [name] is a 29 y.o. 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, MRSA bacteremia, microaerophlic bacteremia and now VAP with acinetobacter and persistent fevers  #1 VAP: repeat BAL sent with abundant Acinetobacter growing again --Continue imipenem and gentamicin and complete 14 day course of dual therapy through August 25th and then stop them Would continue through  --Continue to attempt CPT the patient is not compliant with this   #2 MRSA bacteremia and microaerophillic streptococcal bacteremia Continue  ZYVOX for now then back to vancomycin on the 26th to be continued until September 18th   #3 Decubitus ulcers mx: I really have  A VERY hard time seeing how he is going to do well  Plastics is following  #5 Goals of care: I would STRONGLY recommend palliative care consult. I am very skeptical about Mr Aguilar's long term chances and even his ability to survive this hospitalization.  I had further conversation with Mom and the patient that would be wise for  palliative care wouldo be involved this admission  EVEN though at this very moment he is doing better. I do not think that she clearly sees or accepts that her  sons prognosis is not favorable.  I will sign off for now  Please call with further questions.  Michel Bickers, MD will be covering ID inpatient consult service should you need our help formally this month.      LOS: 28 days   [name] 05/23/2015, 2:21 PM

## 2015-05-23 NOTE — Progress Notes (Signed)
Patient's family refused Chest PT at this time due to not wanting to wake him. RT will continue to monitor.

## 2015-05-24 LAB — GLUCOSE, CAPILLARY
GLUCOSE-CAPILLARY: 130 mg/dL — AB (ref 65–99)
GLUCOSE-CAPILLARY: 164 mg/dL — AB (ref 65–99)
Glucose-Capillary: 185 mg/dL — ABNORMAL HIGH (ref 65–99)
Glucose-Capillary: 205 mg/dL — ABNORMAL HIGH (ref 65–99)
Glucose-Capillary: 246 mg/dL — ABNORMAL HIGH (ref 65–99)

## 2015-05-24 LAB — PROCALCITONIN: Procalcitonin: 1.32 ng/mL

## 2015-05-24 LAB — MAGNESIUM: Magnesium: 2 mg/dL (ref 1.7–2.4)

## 2015-05-24 MED ORDER — TOBRAMYCIN SULFATE 80 MG/2ML IJ SOLN
160.0000 mg | INTRAVENOUS | Status: DC
  Administered 2015-05-25 – 2015-05-31 (×5): 160 mg via INTRAVENOUS
  Filled 2015-05-24 (×7): qty 4

## 2015-05-24 MED ORDER — CHLORHEXIDINE GLUCONATE 0.12 % MT SOLN
15.0000 mL | Freq: Two times a day (BID) | OROMUCOSAL | Status: DC
  Administered 2015-05-25 – 2015-06-01 (×15): 15 mL via OROMUCOSAL
  Filled 2015-05-24 (×18): qty 15

## 2015-05-24 MED ORDER — POTASSIUM & SODIUM PHOSPHATES 280-160-250 MG PO PACK
2.0000 | PACK | Freq: Three times a day (TID) | ORAL | Status: AC
  Administered 2015-05-24 (×2): 2 via ORAL
  Filled 2015-05-24 (×2): qty 2

## 2015-05-24 NOTE — Evaluation (Signed)
Objective Swallowing Evaluation:  (FEES)  Patient Details  Name: Jason [name] MRN: [id number] Date of Birth: 1986/03/27  Today's Date: 05/24/2015 Time: SLP Start Time (ACUTE ONLY): 1330-SLP Stop Time (ACUTE ONLY): 1420 SLP Time Calculation (min) (ACUTE ONLY): 50 min  Past Medical History:  Past Medical History  Diagnosis Date  . GERD (gastroesophageal reflux disease)   . Asthma   . Hx MRSA infection     on face  . Gastroparesis   . Diabetic neuropathy   . Seizures   . Family history of anesthesia complication     Pt mother can't have epidural procedures  . Dysrhythmia   . Pneumonia   . Arthritis   . Fibromyalgia   . 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 Type I diabetes mellitus     sees Dr. Loanne Drilling   . Syncope 02/16/2015   Past Surgical History:  Past Surgical History  Procedure Laterality Date  . Tonsillectomy    . Multiple extractions with alveoloplasty N/A 08/03/2014    Procedure: MULTIPLE EXTRACTIONS;  Surgeon: Gae Bon, DDS;  Location: [place];  Service: Oral Surgery;  Laterality: N/A;  . Tee without cardioversion N/A 08/17/2014    Procedure: TRANSESOPHAGEAL ECHOCARDIOGRAM (TEE);  Surgeon: Dorothy Spark, MD;  Location: [facility];  Service: Cardiovascular;  Laterality: N/A;  . Debridment of decubitus ulcer N/A 10/04/2014    Procedure: DEBRIDMENT OF DECUBITUS ULCER;  Surgeon: Georganna Skeans, MD;  Location: [name];  Service: General;  Laterality: N/A;  . Laparoscopic diverted colostomy N/A 10/12/2014    Procedure: LAPAROSCOPIC DIVERTING COLOSTOMY;  Surgeon: Donnie Mesa, MD;  Location: [place];  Service: General;  Laterality: N/A;  . Insertion of suprapubic catheter N/A 10/12/2014    Procedure: INSERTION OF SUPRAPUBIC CATHETER;  Surgeon: Reece Packer, MD;  Location: [place];  Service: Urology;  Laterality: N/A;  . Incision and drainage of wound N/A 11/12/2014    Procedure: IRRIGATION AND DEBRIDEMENT OF WOUNDS WITH BONE BIOPSY AND  SURGICAL PREP ;  Surgeon: Theodoro Kos, DO;  Location: [place];  Service: Plastics;  Laterality: N/A;  . Application of a-cell of back N/A 11/12/2014    Procedure: APPLICATION A CELL AND VAC ;  Surgeon: Theodoro Kos, DO;  Location: [place];  Service: Plastics;  Laterality: N/A;  . Incision and drainage of wound N/A 11/18/2014    Procedure: IRRIGATION AND DEBRIDEMENT OF SACRAL ULCER ONLY WITH PLACEMENT OF A CELL AND VAC/ DRESSING CHANGE TO UPPER BACK AREA.;  Surgeon: Theodoro Kos, DO;  Location: [place];  Service: Plastics;  Laterality: N/A;  . Incision and drainage of wound N/A 11/25/2014    Procedure: IRRIGATION AND DEBRIDEMENT OF SACRAL ULCER AND BACK BURN WITH PLACEMENT OF A-CELL;  Surgeon: Theodoro Kos, DO;  Location: [place];  Service: Plastics;  Laterality: N/A;  . Minor application of wound vac N/A 11/25/2014    Procedure:  WOUND VAC CHANGE;  Surgeon: Theodoro Kos, DO;  Location: [place];  Service: Plastics;  Laterality: N/A;  . Incision and drainage of wound Right 12/08/2014    Procedure: IRRIGATION AND DEBRIDEMENT SACRAL WOUND AND RIGHT ISCHIAL WOUND ;  Surgeon: Theodoro Kos, DO;  Location: [place];  Service: Plastics;  Laterality: Right;  . Application of a-cell of back N/A 12/08/2014    Procedure: APPLICATION OF A-CELL AND WOUND VAC ;  Surgeon: Theodoro Kos, DO;  Location: [place];  Service: Plastics;  Laterality: N/A;  . Tee without cardioversion N/A 05/05/2015  Procedure: TRANSESOPHAGEAL ECHOCARDIOGRAM (TEE);  Surgeon: Lelon Perla, MD;  Location: [place];  Service: Cardiovascular;  Laterality: N/A;  . Incision and drainage of wound Bilateral 05/05/2015    Procedure: IRRIGATION AND DEBRIDEMENT SACRAL ULCER;  Surgeon: Theodoro Kos, DO;  Location: [place];  Service: Plastics;  Laterality: Bilateral;  . Hematoma evacuation N/A 05/05/2015    Procedure: EVACUATION HEMATOMA bedside procedure;  Surgeon: Theodoro Kos, DO;  Location: [place];  Service: Plastics;  Laterality: N/A;   HPI:  Other  Pertinent Information: 29 year old male quad with MRSA bacteremia from multiple sources. Concern for endocarditis.Patient has history of hypotension and concern for septic shock. 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 Intubated 7/31-8/3, reintubated 8/3 with trach 8/4.  No Data Recorded  Assessment / Plan / Recommendation CHL IP CLINICAL IMPRESSIONS 05/24/2015  Therapy Diagnosis Severe oral phase dysphagia;Moderate oral phase dysphagia  Clinical Impression FEES completed with pt on trach collar and PMSV. Moderate-severe pharyngeal dysphagia likely due to multiple factors including intubation x 2 over 4 day period, generalized weakness with a weak "quad cough". Decreased sensation, reduced tongue base retraction and decreased laryngeal elevation led to delayed swallow, vallecular and pyriform sinus residue and silent penetration into laryngeal vestibule. Verbal cues and manual assist for "quad cough" via pt's mom and multiple swallows were ineffective. Mom reports plans are for G-tube for adequate nutrition. SLP will continue PMSV and dysphagia intervention and hopeful return to po's in addition to G-tube.      CHL IP TREATMENT RECOMMENDATION 05/24/2015  Treatment Recommendations Therapy as outlined in treatment plan below     CHL IP DIET RECOMMENDATION 05/24/2015  SLP Diet Recommendations NPO  Liquid Administration via (None)  Medication Administration Via alternative means  Compensations (None)  Postural Changes and/or Swallow Maneuvers (None)     CHL IP OTHER RECOMMENDATIONS 05/24/2015  Recommended Consults (None)  Oral Care Recommendations Oral care QID  Other Recommendations (None)     CHL IP FOLLOW UP RECOMMENDATIONS 05/24/2015  Follow up Recommendations 24 hour supervision/assistance     CHL IP FREQUENCY AND DURATION 05/24/2015  Speech Therapy Frequency (ACUTE ONLY) min 2x/week  Treatment Duration 2 weeks     Pertinent Vitals/Pain      SLP Swallow Goals No flowsheet data found.  No flowsheet data found.    CHL IP REASON FOR REFERRAL 05/24/2015  Reason for Referral Objectively evaluate swallowing function     CHL IP ORAL PHASE 05/24/2015  Lips (None)  Tongue (None)  Mucous membranes (None)  Nutritional status (None)  Other (None)  Oxygen therapy (None)  Oral Phase WFL  Oral - Pudding Teaspoon (None)  Oral - Pudding Cup (None)  Oral - Honey Teaspoon (None)  Oral - Honey Cup (None)  Oral - Honey Syringe (None)  Oral - Nectar Teaspoon (None)  Oral - Nectar Cup (None)  Oral - Nectar Straw (None)  Oral - Nectar Syringe (None)  Oral - Ice Chips (None)  Oral - Thin Teaspoon (None)  Oral - Thin Cup (None)  Oral - Thin Straw (None)  Oral - Thin Syringe (None)  Oral - Puree (None)  Oral - Mechanical Soft (None)  Oral - Regular (None)  Oral - Multi-consistency (None)  Oral - Pill (None)  Oral Phase - Comment (None)            No flowsheet data found.         [name] 05/24/2015, 3:14 PM  Lattie Haw  Jannifer [employer]Ed [employer] [phone]

## 2015-05-24 NOTE — Progress Notes (Addendum)
Nutrition Follow-up  DOCUMENTATION CODES:   Severe malnutrition in context of acute illness/injury, Underweight  INTERVENTION:    Continue Vital AF 1.2 formula at goal rate of 70 ml/hr to provide 2016 kcals, 126 gm protein, 1362 ml of free water  NUTRITION DIAGNOSIS:   Increased nutrient needs related to wound healing as evidenced by estimated needs; ongoing  GOAL:   Patient will meet greater than or equal to 90% of their needs; met  MONITOR:   Vent status, TF tolerance, Skin, I & O's, Labs, Weight trends  ASSESSMENT:   29 year old male who has a past medical history of GERD (gastroesophageal reflux disease); Asthma; MRSA infection; Gastroparesis; Diabetic neuropathy; Seizures; Family history of anesthesia complication; Dysrhythmia; Pneumonia; Arthritis; Fibromyalgia; Stroke, Type I diabetes mellitus; and Syncope.   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 Patient was sent to the ER from the physical therapy office today after patient was found to be hypotensive. [name] says that he has not been eating and drinking well for past twoweeks. In the ED patient was found to be dehydrated, had white count of 20,000, chest x-ray showing pneumonia as well as abnormal UA consistent with UTI.  Patient s/p procedure 7/28: IRRIGATION AND DEBRIDEMENT SACRAL ULCER - PLACEMENT OF ACELL AND VAC  Patient is currently on ATC.  Vital AF 1.2 formula currently infusing at goal rate of 70 ml/hr via NGT (tip coiled in stomach) providing 2016 kcals, 126 gm protein, 1362 ml of free water.  Tolerating well.  FEES pending.  If fails, plan is for PEG tube placement per IR.  Diet Order:  Diet NPO time specified  Skin:  Wound (see comment) (Stage 4 sacral pressure ulcer, Stage 3 L heel pressure ulcer, Incision to back)  Last BM:  8/15  Height:   Ht Readings from Last 1 Encounters:  04/25/15 _0  (1.727 m)    Weight:   Wt Readings from Last 10  Encounters:  05/23/15 131 lb 6.4 oz (59.603 kg)  04/01/15 130 lb (58.968 kg)  02/20/15 111 lb 15.9 oz (50.8 kg)  02/16/15 112 lb (50.803 kg)  01/12/15 128 lb 4.9 oz (58.2 kg)  12/19/14 130 lb 1.1 oz (59 kg)  11/25/14 155 lb (70.308 kg)  11/19/14 155 lb 13.8 oz (70.7 kg)  10/02/14 125 lb 10.6 oz (57 kg)  09/23/14 131 lb 1.6 oz (59.467 kg)    Ideal Body Weight:  70 kg (kg)  BMI:  Body mass index is 19.98 kg/(m^2).  Estimated Nutritional Needs:   Kcal:  1800-2000  Protein:  110-120 gm  Fluid:  per MD  EDUCATION NEEDS:   No education needs identified at this time  Arthur Holms, RD, LDN Pager #: [phone] After-Hours Pager #: [phone]

## 2015-05-24 NOTE — Progress Notes (Signed)
Patient refused Chest PT at this time. RT will continue to monitor.

## 2015-05-24 NOTE — Progress Notes (Signed)
PULMONARY / CRITICAL CARE MEDICINE   Name: Jason [name] MRN: [id number] DOB: 1985/12/10    ADMISSION DATE: 04/25/2015 TODAY'S DATE: 05/10/2015  REFERRING MD : TRH  CHIEF COMPLAINT: Hypotension  INITIAL PRESENTATION: 29 year old male quad with MRSA bacteremia from multiple sources. Concern for endocarditis. Wounds have been checked and followed by general surgery and ortho. Patient has history of hypotension and concern for septic shock and levophed was started and PCCM consulted. 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 ?  HCAP.   SIGNIFICANT EVENTS: 7/28 >> OR for debridement of osteomyelitis and abscess cx with staph. Positive MRSA and strep blood cx 7/28 >> Reconsulted for extensive post op bleeding and hypotension 7/31 >> worsening respiratory status, acidosis, hypoxemia, hypotension overnight  STUDIES: 7/28 TEE >> NO vegetation, normal LV function  7/31 Bronch >> HCAP, extensive mucous plug 8/2 bronch - no sig obstruction 8/2- family does not want trach at this point even though is recommended 8/3 bronch lungs open, clear, extubated, re intubated lat evening due to collapse 8/4- pt changed decision, trach placed 8/8 weaned 10+ hours 05/22/15: nad on vent/ no new concerns per nursing/ ID note reviewed and strongly rec palliative care eval    SUBJECTIVE/OVERNIGHT/INTERVAL HX Afebrile Denies pain tolerated ATc x 6h   VITAL SIGNS: Temp:  [97.7 F (36.5 C)-98.6 F (37 C)] 97.7 F (36.5 C) (08/16 0823) Pulse Rate:  [83-100] 92 (08/16 0823) Resp:  [10-25] 20 (08/16 0823) BP: (96-128)/(62-87) 106/81 mmHg (08/16 0823) SpO2:  [100 %] 100 % (08/16 0823) FiO2 (%):  [35 %-40 %] 35 % (08/16 0757) HEMODYNAMICS:   VENTILATOR SETTINGS: Vent Mode:  [-] PRVC FiO2 (%):  [35 %-40 %] 35 % Set Rate:  [16 bmp] 16 bmp Vt Set:  [570 mL] 570 mL PEEP:  [5 cmH20] 5 cmH20 Plateau Pressure:  [26 cmH20-27 cmH20] 26 cmH20 INTAKE /  OUTPUT:  Intake/Output Summary (Last 24 hours) at 05/24/15 1018 Last data filed at 05/24/15 0100  Gross per 24 hour  Intake   1593 ml  Output   1850 ml  Net   -257 ml    PHYSICAL EXAMINATION:  Gen: chronically ill appearing, sleepy on ATC HENT: trach clean, dry PULM: vent supported breaths, minimal bilateral insp / exp rhonchi CV: RRR, no mgr, trace edema arms, feet GI: BS+, soft, nontender Derm:  sacral wound stage 4 plus Neuro:  Sleepy but arouses to verbal, nods approp   LABS:  PULMONARY No results for input(s): PHART, PCO2ART, PO2ART, HCO3, TCO2, O2SAT in the last 168 hours.  Invalid input(s): PCO2, PO2  CBC  Recent Labs Lab 05/20/15 0244 05/21/15 0326 05/23/15 1245  HGB 6.4* 7.9* 8.2*  HCT 19.8* 23.3* 25.5*  WBC 35.0* 33.2* 16.9*  PLT 219 306 460*    COAGULATION  Recent Labs Lab 05/20/15 0244  INR 4.44*    CARDIAC  No results for input(s): TROPONINI in the last 168 hours. No results for input(s): PROBNP in the last 168 hours.   CHEMISTRY  Recent Labs Lab 05/18/15 0251 05/19/15 0430 05/20/15 0244 05/21/15 0326 05/22/15 0348 05/23/15 0251 05/23/15 1245  NA 143  --  141 141  --   --  138  K 4.2  --  4.4 3.9  --   --  4.3  CL 116*  --  112* 114*  --   --  108  CO2 22  --  21* 21*  --   --  23  GLUCOSE 229*  --  129* 194*  --   --  235*  BUN 17  --  16 15  --   --  13  CREATININE 0.40*  --  0.42* 0.47*  --  0.41* 0.38*  CALCIUM 7.1*  --  7.2* 7.3*  --   --  7.6*  MG 1.8 1.7 1.5* 1.5* 1.5* 1.5*  --   PHOS  --   --  1.5*  --   --   --  1.6*   Estimated Creatinine Clearance: 113.8 mL/min (by C-G formula based on Cr of 0.38).   LIVER  Recent Labs Lab 05/20/15 0244  INR 4.44*     INFECTIOUS  Recent Labs Lab 05/23/15 1245  LATICACIDVEN 0.8  PROCALCITON 2.35     ENDOCRINE CBG (last 3)   Recent Labs  05/24/15 0040 05/24/15 0427 05/24/15 0800  GLUCAP 185* 246* 205*         IMAGING x48h  - image(s) personally  visualized  -   highlighted in bold No results found.     ASSESSMENT / PLAN:  PULMONARY A:  Acute hypoxemia respiratory failure > due to pulm edema vs HCAP with atelectasis  Pleural effusion R base, small Collapse rt base RML, RLL> improved post bronch   Trach 8/5 >>   P:  - continued ATC in AM and vent night Planning for home noct vent Chest PT. In-line PMV     CARDIOVASCULAR Right Arm PICC 7/19>>> A:  Shock resolved Relative adrenal insufficiency  P:  Observe off steroids Continue Midodrine 20mg  PO TID  RENAL A:  Hypomag Leaking supra-pubic foley  P:  Urology called 8/15  Monitor BMET Replace electrolytes as needed  GASTROINTESTINAL OGT A:  Severe malnutrition P:  Zofran PRN nausea PPI  Cont TF Proceed with FEES- if fails, IR consulted for g-tube placement.  HEMATOLOGIC A:  Anemia - due to blood loss from sacral wound and severe peri op bleeding from debridement, dilution >> Transfused 1 unit pRBC 8/12 DVT prevention Leukocytosis -decreasing May 2016 PE IVC filter placed 8/6  Recent Labs Lab 05/20/15 0244 05/21/15 0326 05/23/15 1245  HGB 6.4* 7.9* 8.2*    P:  Trend CBC in am  SCD Monitor for bleeding   INFECTIOUS  BCx2 7/18 >>  MRSA BCx2 7/21 >>MRSA, strep Wound/tissue culture 7/28 >>  Bronch bal 7/31 >> MDR acint  BAL 8/4>>>few GNR>>>acinetobacter   A:  MRSA bacteremia, neg TEE, likely osteomyelitis source E coli UTI  New HCAP likely Once finish abx course, need to remove all lines  MDR acinatobacter Per ID ID following /Rx per ID      P:   Vanc 7/18 >   8/11 Aztreonam 7/18>>>8/3 8/1 cipro>>>8/3 8/3 gent  >> 8/25 (planned) Imipenem >> 8/25 (planned) zyvox >> 8/26 (planned), then vanc >> 9/18 (planned)     ENDOCRINE A:  Possible adrenal insufficiency  T1 DM hypoglycemia P:  SSI Observe off steroids    NEUROLOGIC A: Quad post trauma Post op pain  Acute encephalopathy 7/31 >  improved   P:  RASS goal 0 Continue fent patch & fent prn remeron qhs      DERMS A: stage 4 sacral decub P  - Dr Migdalia Dk to be recalled per mom   Mom updated 05/24/2015   Summary - Tolerating ATC, proceed with FEES - if fails, then PEG per IR now that WBC down  Kara Mead MD. FCCP. [facility] Pager  [phone] If no response call [phone]     05/24/2015 10:18 AM

## 2015-05-24 NOTE — Progress Notes (Signed)
Patient refused CPT at this time. Will resume at next schedule treatment at 1600. RN aware.

## 2015-05-24 NOTE — Progress Notes (Signed)
05/24/2015 Spoke to Dr Louis Meckel (urologist) at [facility] concerning [place] cath, and it was leaking. Dr Elsworth Soho was given Dr Louis Meckel number. [facility] RN.

## 2015-05-24 NOTE — Progress Notes (Signed)
Inpatient Diabetes Program Recommendations  AACE/ADA: New Consensus Statement on Inpatient Glycemic Control (2013)  Target Ranges:  Prepandial:   less than 140 mg/dL      Peak postprandial:   less than 180 mg/dL (1-2 hours)      Critically ill patients:  140 - 180 mg/dL   Results for Jason [name], Jason [name] (MRN [id number]) as of 05/24/2015 07:50  Ref. Range 05/23/2015 09:03 05/23/2015 11:44 05/23/2015 16:31 05/23/2015 21:08 05/24/2015 00:40 05/24/2015 04:27  Glucose-Capillary Latest Ref Range: 65-99 mg/dL 155 (H) 221 (H) 255 (H) 166 (H) 185 (H) 246 (H)   Diabetes history: DM 2 Outpatient Diabetes medications: Novolog 5 units TID, Lantus 20 units QHS Current orders for Inpatient glycemic control: Novolog Moderate Q4hrs  Inpatient Diabetes Program Recommendations Insulin - Basal: Patient's glucose in the 200's. Patient takes 20 units of Lantus at home. Please consider starting basal insulin, Lantus 6 units Q 24hrs.   Thanks,  Tama Headings RN, MSN, [facility] Inpatient Diabetes Coordinator Team Pager [phone] (8am-5pm)

## 2015-05-24 NOTE — Progress Notes (Signed)
Called placed to [facility] on behalf of [place]. Marland Kitchen Spoke to Glass blower/designer. Consult had been called for them to see pt on 8/15. PT is seen by their office. Time for suprapubic catheter to be changed - also it is  leaking some. Left full detailed message & call back number if they had questions.

## 2015-05-24 NOTE — Progress Notes (Addendum)
Pharmacy Consult for Imipenem + Linezolid + Tobramycin Indication:  Acinetobacter pneumonia/MRSA bacteremia   Assessment: 29 yo m with complicated infectious diseases course. Patient is wheelchair bound with multiple medical problems.  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  Per sensitivities, the MIC of gentamicin is 4 and the MIC of tobramycin is < 1.  Gentamicin and tobramycin are similar aminoglycosides, but for this organism, the MIC to tobramycin is much lower, so we will switch to tobramycin.   Based on patient specific kinetics, will start him on tobramycin 160 mg IV q36h to achieve a peak of ~7.5 and a trough ~1. His last dose of tobramycin and imipenem will be 8/26 per Dr. Tommy Medal, and he will continue Linezolid until 8/26 and then switch over to vancomycin through 9/18 for his MRSA/microaerophilic Strep bacteremia. Wbc 16.9, tmax 98.6, plts 460, hgb 8.2, SCr 0.38, UOP 1.9 ml/kg/hr yesterday.  Aztreonam 7/18 >>7/30; Resumed 7/31 > 8/3 Cipro 8/1 >> 8/2 Gentamicin (pna) 8/4 >>8/9, 8/12 >> 8/16 Tigecycline 8/3 >> 8/12 Vancomycin (bacteremia) 7/18 >> 8/12  Imipenem 8/12 >> (8/25) Tobramycin 8/16 >> (8/25) Linezolid 8/12 >> (8/26, then switch to vancomycin until 9/18)  7/18 blood: 2/2 MRSA, microaerophilic strep 6/72 urine: >100k E.coli (pansensitive except Cipro; sensitive to aztreonam)  7/18 MRSA PCR: positive 7/21 blood: 1/2 microaerophilic strep 0/94 blood: neg final 7/31 Trach asp: Acinetobacter (S Unasyn, gent, imi, tobra. MIC 1 for tigecycline, 0.0125 for Colistin) 8/4 Resp (lavage): acinetobacter 8/7 blood x 2: ntd 8/10 trach asp: acinetobacter  Goal of Therapy:  Gentamicin trough level <2 mcg/ml  Eradication of infection  Plan:  Discontinue gentamicin Start tobramycin 160 mg IV q36h Continue imipenem 500 mg IV q8h Continue linezolid 600 mg IV q12h Drug levels when clinically indicated Monitor renal function  closely while on tobramycin Monitor platelets while on Linezolid  Ruweyda Macknight L. Nicole Kindred, PharmD PGY2 Infectious Diseases Pharmacy Resident Pager: [phone] 05/24/2015 2:54 PM

## 2015-05-24 NOTE — Progress Notes (Signed)
Speech Language Pathology Treatment: Nada Boozer Speaking valve  Patient Details Name: Jason [name] MRN: [id number] DOB: [birth year] Today's Date: 05/24/2015 Time: 1583-0940 SLP Time Calculation (min) (ACUTE ONLY): 20 min  Assessment / Plan / Recommendation Clinical Impression  PMSV therapy while on trach collar. He elicited hoarse vocal quality with intelligible speech in conversation with vitals within normal range. Wet vocal quality cleared with verbal cues and multiple attempts for volitional throat clears and pt oral self suctioning. Good coordination of speech and respiratory support. Mom present and reviewed education re: valve. Continue to wear valve intermittently with full supervision with staff and mom (mom demonstrated donn and doff valve yesterday with SLP). Will plan for FEES this afternoon.    HPI Other Pertinent Information: 29 year old male quad with MRSA bacteremia from multiple sources. Concern for endocarditis. Wounds have been checked and followed by general surgery and ortho. Patient has history of hypotension and concern for septic shock and levophed was started and PCCM consulted. 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 Now with possible HCAP. Trach placed 05/12/15.   Pertinent Vitals Pain Assessment: 0-10 Pain Score: 9  Pain Intervention(s): Patient requesting pain meds-RN notified  SLP Plan  Continue with current plan of care;MBS    Recommendations        Patient may use Passy-Muir Speech Valve: Intermittently with supervision;Caregiver trained to provide supervision PMSV Supervision: Full MD: Please consider changing trach tube to : Smaller size       Oral Care Recommendations: Oral care BID Follow up Recommendations: 24 hour supervision/assistance Plan: Continue with current plan of care;MBS    GO     [name] 05/24/2015, 2:42 PM   Orbie Pyo Shloime Keilman M.Ed [employer] [phone]

## 2015-05-25 ENCOUNTER — Ambulatory Visit: Payer: Self-pay | Admitting: Physical Medicine & Rehabilitation

## 2015-05-25 ENCOUNTER — Encounter: Payer: Medicaid Other | Admitting: Registered Nurse

## 2015-05-25 DIAGNOSIS — J9610 Chronic respiratory failure, unspecified whether with hypoxia or hypercapnia: Secondary | ICD-10-CM

## 2015-05-25 LAB — BASIC METABOLIC PANEL
ANION GAP: 5 (ref 5–15)
BUN: 13 mg/dL (ref 6–20)
CALCIUM: 7.3 mg/dL — AB (ref 8.9–10.3)
CO2: 22 mmol/L (ref 22–32)
CREATININE: 0.43 mg/dL — AB (ref 0.61–1.24)
Chloride: 105 mmol/L (ref 101–111)
Glucose, Bld: 247 mg/dL — ABNORMAL HIGH (ref 65–99)
Potassium: 4.5 mmol/L (ref 3.5–5.1)
SODIUM: 132 mmol/L — AB (ref 135–145)

## 2015-05-25 LAB — CBC
HCT: 24 % — ABNORMAL LOW (ref 39.0–52.0)
HEMOGLOBIN: 7.8 g/dL — AB (ref 13.0–17.0)
MCH: 29.5 pg (ref 26.0–34.0)
MCHC: 32.5 g/dL (ref 30.0–36.0)
MCV: 90.9 fL (ref 78.0–100.0)
PLATELETS: 520 10*3/uL — AB (ref 150–400)
RBC: 2.64 MIL/uL — AB (ref 4.22–5.81)
RDW: 14.9 % (ref 11.5–15.5)
WBC: 13 10*3/uL — AB (ref 4.0–10.5)

## 2015-05-25 LAB — GLUCOSE, CAPILLARY
GLUCOSE-CAPILLARY: 116 mg/dL — AB (ref 65–99)
GLUCOSE-CAPILLARY: 199 mg/dL — AB (ref 65–99)
GLUCOSE-CAPILLARY: 204 mg/dL — AB (ref 65–99)
GLUCOSE-CAPILLARY: 217 mg/dL — AB (ref 65–99)
GLUCOSE-CAPILLARY: 221 mg/dL — AB (ref 65–99)
GLUCOSE-CAPILLARY: 229 mg/dL — AB (ref 65–99)
Glucose-Capillary: 220 mg/dL — ABNORMAL HIGH (ref 65–99)

## 2015-05-25 LAB — PROCALCITONIN: PROCALCITONIN: 0.82 ng/mL

## 2015-05-25 LAB — PHOSPHORUS: PHOSPHORUS: 1.6 mg/dL — AB (ref 2.5–4.6)

## 2015-05-25 LAB — MAGNESIUM: MAGNESIUM: 1.8 mg/dL (ref 1.7–2.4)

## 2015-05-25 MED ORDER — INSULIN GLARGINE 100 UNIT/ML ~~LOC~~ SOLN
5.0000 [IU] | Freq: Every day | SUBCUTANEOUS | Status: DC
  Administered 2015-05-25 – 2015-05-31 (×5): 5 [IU] via SUBCUTANEOUS
  Filled 2015-05-25 (×10): qty 0.05

## 2015-05-25 MED ORDER — FENTANYL CITRATE (PF) 100 MCG/2ML IJ SOLN
100.0000 ug | INTRAMUSCULAR | Status: DC | PRN
  Administered 2015-05-25 – 2015-05-31 (×53): 100 ug via INTRAVENOUS
  Filled 2015-05-25 (×54): qty 2

## 2015-05-25 NOTE — Procedures (Signed)
Pre-procedure diagnosis: Neurogenic bladder Post-procedure diagnosis: as above  Procedure performed: Change of suprapubic catheter  Surgeon: Dr. Ardis Hughs  Findings: A 61 French Foley catheter was removed and a new fresh 20 French catheter replaced. 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 He has been managed with a suprapubic catheter. It has been 6 weeks since it was changed.  Procedure:  The catheter that was indwelling was removed after deflating the Foley catheter balloon.   The distance was measured from the skin to the tip of the catheter. It measured approximately 5 cm. The suprapubic catheter tract and surrounding skin was then prepped with Betadine. A new 20 French Foley catheter with adequate surgical lubrication jelly was then advanced through the track beyond the 5 cm mark. 10 mL of sterile water was injected into the balloon. The catheter was then connected to a fresh Foley catheter bag. The patient tolerated the procedure well.   Disposition: Catheter should be exchanged in 1 month. Follow-up as scheduled with Dr. Bjorn Loser.

## 2015-05-25 NOTE — Consult Note (Signed)
WOC Consult requested for topical treatment orders for sacrum. Mother is requesting use of a chemical debridement agent. Pt is followed by plastic surgery team and they have ordered wet to dry dressings.  Primary team contacted Dr Migdalia Dk 2 days ago according to the EMR and she prefers to continue with current dressing changes. Discussed plan of care with primary team via phone; please refer to plastic surgery team for further recommendations if desired.   Please re-consult if further assistance is needed.  Thank-you,  Julien Girt MSN, [place], [place], [place], [place]

## 2015-05-25 NOTE — Progress Notes (Signed)
PULMONARY / CRITICAL CARE MEDICINE   Name: Jason [name] MRN: [id number] DOB: [birth year]    ADMISSION DATE: 04/25/2015 TODAY'S DATE: 05/10/2015  REFERRING MD : TRH  CHIEF COMPLAINT: Hypotension  INITIAL PRESENTATION: 29 year old male quad with MRSA bacteremia from multiple sources. Concern for endocarditis. Wounds have been checked and followed by general surgery and ortho. Patient has history of hypotension and concern for septic shock and levophed was started and PCCM consulted. 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 ?  HCAP.   SIGNIFICANT EVENTS: 7/28 >> OR for debridement of osteomyelitis and abscess cx with staph. Positive MRSA and strep blood cx 7/28 >> Reconsulted for extensive post op bleeding and hypotension 7/31 >> worsening respiratory status, acidosis, hypoxemia, hypotension overnight  STUDIES: 7/28 TEE >> NO vegetation, normal LV function  7/31 Bronch >> HCAP, extensive mucous plug 8/2 bronch - no sig obstruction 8/2- family does not want trach at this point even though is recommended 8/3 bronch lungs open, clear, extubated, re intubated lat evening due to collapse 8/4- pt changed decision, trach placed 8/8 weaned 10+ hours 05/22/15: nad on vent/ no new concerns per nursing/ ID note reviewed and strongly rec palliative care eval    SUBJECTIVE/OVERNIGHT/INTERVAL HX Suprapubic catheter leaking.  Failed FEES.  Plan for IR PEG.    VITAL SIGNS: Temp:  [97.5 F (36.4 C)-99.2 F (37.3 C)] 98.9 F (37.2 C) (08/17 0813) Pulse Rate:  [81-103] 102 (08/17 0813) Resp:  [16-29] 29 (08/17 0813) BP: (89-142)/(54-92) 101/54 mmHg (08/17 0813) SpO2:  [98 %-100 %] 99 % (08/17 0813) FiO2 (%):  [28 %-40 %] 40 % (08/17 0719) Weight:  [120 lb (54.432 kg)] 120 lb (54.432 kg) (08/17 0500) HEMODYNAMICS:   VENTILATOR SETTINGS: Vent Mode:  [-] PRVC FiO2 (%):  [28 %-40 %] 40 % Set Rate:  [16 bmp] 16 bmp Vt Set:  [570 mL] 570  mL PEEP:  [5 cmH20] 5 cmH20 Plateau Pressure:  [20 cmH20-29 cmH20] 24 cmH20 INTAKE / OUTPUT:  Intake/Output Summary (Last 24 hours) at 05/25/15 1127 Last data filed at 05/25/15 0600  Gross per 24 hour  Intake      0 ml  Output   4950 ml  Net  -4950 ml    PHYSICAL EXAMINATION:  Gen: chronically ill appearing, NAD on ATC HENT: trach clean, dry PULM: resps even non labored on ATC, diminished bases, few scattered rhonchi  CV: RRR, no mgr, trace edema arms, feet GI: BS+, soft, nontender Derm:  sacral wound stage 4 plus Neuro:  Awake, alert, nods approp   LABS:  PULMONARY No results for input(s): PHART, PCO2ART, PO2ART, HCO3, TCO2, O2SAT in the last 168 hours.  Invalid input(s): PCO2, PO2  CBC  Recent Labs Lab 05/21/15 0326 05/23/15 1245 05/25/15 0438  HGB 7.9* 8.2* 7.8*  HCT 23.3* 25.5* 24.0*  WBC 33.2* 16.9* 13.0*  PLT 306 460* 520*    COAGULATION  Recent Labs Lab 05/20/15 0244  INR 4.44*    CARDIAC  No results for input(s): TROPONINI in the last 168 hours. No results for input(s): PROBNP in the last 168 hours.   CHEMISTRY  Recent Labs Lab 05/20/15 0244 05/21/15 0326 05/22/15 0348 05/23/15 0251 05/23/15 1245 05/24/15 0850 05/25/15 0438  NA 141 141  --   --  138  --  132*  K 4.4 3.9  --   --  4.3  --  4.5  CL 112* 114*  --   --  108  --  105  CO2 21* 21*  --   --  23  --  22  GLUCOSE 129* 194*  --   --  235*  --  247*  BUN 16 15  --   --  13  --  13  CREATININE 0.42* 0.47*  --  0.41* 0.38*  --  0.43*  CALCIUM 7.2* 7.3*  --   --  7.6*  --  7.3*  MG 1.5* 1.5* 1.5* 1.5*  --  2.0 1.8  PHOS 1.5*  --   --   --  1.6*  --  1.6*   Estimated Creatinine Clearance: 103.9 mL/min (by C-G formula based on Cr of 0.43).   LIVER  Recent Labs Lab 05/20/15 0244  INR 4.44*     INFECTIOUS  Recent Labs Lab 05/23/15 1245 05/24/15 0850 05/25/15 0438  LATICACIDVEN 0.8  --   --   PROCALCITON 2.35 1.32 0.82     ENDOCRINE CBG (last 3)   Recent  Labs  05/24/15 2339 05/25/15 0350 05/25/15 0811  GLUCAP 204* 221* 199*      IMAGING x48h  - image(s) personally visualized  -   highlighted in bold No results found.     ASSESSMENT / PLAN:  PULMONARY A:  Acute hypoxemia respiratory failure > due to pulm edema vs HCAP with atelectasis  Pleural effusion R base, small Collapse rt base RML, RLL> improved post bronch   Trach 8/5 >> P:  Continue daytime ATC as tolerated with mandatory qhs vent - Planning for home noct vent Chest PT In-line PMV     CARDIOVASCULAR Right Arm PICC 7/19>>> A:  Shock resolved Relative adrenal insufficiency P:  Observe off steroids Continue Midodrine 20mg  PO TID  RENAL A:  Hypomag Leaking supra-pubic foley P:  Urology called 8/15 - will call again 8/17  Monitor BMET Replace electrolytes as needed  GASTROINTESTINAL OGT A:  Severe malnutrition Dysphagia - failed FEES 8/16 P:  Zofran PRN nausea PPI  Cont TF IR following for G-Tube placement  HEMATOLOGIC A:  Anemia - due to blood loss from sacral wound and severe peri op bleeding from debridement, dilution >> Transfused 1 unit pRBC 8/12 DVT prevention Leukocytosis -decreasing May 2016 PE IVC filter placed 8/6 P:  Trend CBC in am  SCD Monitor for bleeding   INFECTIOUS BCx2 7/18 >>  MRSA BCx2 7/21 >>MRSA, strep Wound/tissue culture 7/28 >>  Bronch bal 7/31 >> MDR acint  BAL 8/4>>>few GNR>>>acinetobacter  A:  MRSA bacteremia, neg TEE, likely osteomyelitis source E coli UTI  New HCAP likely Once finish abx course, need to remove all lines  MDR acinatobacter Per ID ID following /Rx per ID   P:   Vanc 7/18 >   8/11  Aztreonam 7/18>>>8/3 8/1 cipro>>>8/3 8/3 gent  >> 8/25 (planned) Imipenem >> 8/25 (planned) zyvox >> 8/26 (planned), then vanc >> 9/18 (planned)    ENDOCRINE A:  Possible adrenal insufficiency  T1 DM P:  SSI Observe off steroids  Add low dose lantus  8/17   NEUROLOGIC A: Quad post trauma Post op pain  Acute encephalopathy 7/31 > improved  P:  RASS goal 0 Continue fent patch & fent prn remeron qhs     DERMS A: stage 4 sacral decub P  - Plastics following peripherally    Mom updated 05/25/2015 at length.  She asked if his "prognosis is improving".  She seems fairly unrealistic and discussed a family member who was "exactly like Warden" after  a stroke including wounds/trach who is now "totally better" with healed wounds and decannulated.  Mother thinks that with some rehab pt can improve, heal his wounds and ultimately get some mobility/function back.  Discussed known long-term complications including PNA, further wound infections, worsening of wounds, DVT/PE.  She is anxious to get him home with qhs vent when able.    Nickolas Madrid, NP 05/25/2015  11:27 AM Pager: [phone] or [phone]

## 2015-05-25 NOTE — Progress Notes (Signed)
In to change dressing and Orders D/C'd for M-W-F wound dressing changed on back and foot. Asked PCCM about orders, they stated to page plastic surgery. Will try and get ahold of them tonight for orders, if not will pass along to day shift in AM.

## 2015-05-25 NOTE — Progress Notes (Signed)
Pt refusing CPT at this time, pt stated he may try it later in the evening. Pt also refusing suctioning at this time pt sounds clear with O2 sat of 100% and 95HR. RT will attempt to suction later in shift. PT is not ready for bed and so pt is not ready to go on ventilator at this time. RT will place on vent when pt is ready

## 2015-05-25 NOTE — Progress Notes (Signed)
Patient ID: Jason [name], male   DOB: Sep 12, 1986, 29 y.o.   MRN: [id number]   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Dysphagia Long term care See 8/14 H/P note  afeb x 48 hrs Wbc 13 and trending down CXR clearing CT okayed for procedure of G tube Failed FEES yesterday  Will move ahead with Percutaneous Gastric tube in IR 8/18 Dependent on INR in am (4.4 8/12)  LD Eloquis last week

## 2015-05-25 NOTE — Progress Notes (Signed)
Called PCCM about pts increased pain the last 24hrs, mom said this is typically due to the change in weather and happens often. Pt was asking for pain medication prior to times due all day. Orders changed.

## 2015-05-25 NOTE — Progress Notes (Signed)
[facility] Progress Note Patient Name: Jason [name] DOB: 1986-08-25 MRN: [id number]   Date of Service  05/25/2015  HPI/Events of Note  Patient asking for Q 2 hour Fentanyl every 1 hour.   eICU Interventions  Will increase Fentanyl 100 mcg IV to Q 1 hour.      Intervention Category Intermediate Interventions: Pain - evaluation and management  Lysle Dingwall 05/25/2015, 9:31 PM

## 2015-05-26 ENCOUNTER — Inpatient Hospital Stay (HOSPITAL_COMMUNITY): Payer: Medicaid Other

## 2015-05-26 LAB — GLUCOSE, CAPILLARY
GLUCOSE-CAPILLARY: 186 mg/dL — AB (ref 65–99)
GLUCOSE-CAPILLARY: 187 mg/dL — AB (ref 65–99)
GLUCOSE-CAPILLARY: 78 mg/dL (ref 65–99)
GLUCOSE-CAPILLARY: 147 mg/dL — AB (ref 65–99)
GLUCOSE-CAPILLARY: 54 mg/dL — AB (ref 65–99)
Glucose-Capillary: 124 mg/dL — ABNORMAL HIGH (ref 65–99)
Glucose-Capillary: 126 mg/dL — ABNORMAL HIGH (ref 65–99)

## 2015-05-26 LAB — CBC
HCT: 25.4 % — ABNORMAL LOW (ref 39.0–52.0)
Hemoglobin: 8.2 g/dL — ABNORMAL LOW (ref 13.0–17.0)
MCH: 29.5 pg (ref 26.0–34.0)
MCHC: 32.3 g/dL (ref 30.0–36.0)
MCV: 91.4 fL (ref 78.0–100.0)
PLATELETS: 602 10*3/uL — AB (ref 150–400)
RBC: 2.78 MIL/uL — AB (ref 4.22–5.81)
RDW: 14.6 % (ref 11.5–15.5)
WBC: 14.5 10*3/uL — AB (ref 4.0–10.5)

## 2015-05-26 LAB — PROTIME-INR
INR: 1.1 (ref 0.00–1.49)
PROTHROMBIN TIME: 14.4 s (ref 11.6–15.2)

## 2015-05-26 LAB — APTT: aPTT: 42 seconds — ABNORMAL HIGH (ref 24–37)

## 2015-05-26 LAB — MAGNESIUM: Magnesium: 1.6 mg/dL — ABNORMAL LOW (ref 1.7–2.4)

## 2015-05-26 MED ORDER — GLUCAGON HCL RDNA (DIAGNOSTIC) 1 MG IJ SOLR
INTRAMUSCULAR | Status: AC
  Filled 2015-05-26: qty 1

## 2015-05-26 MED ORDER — CEFAZOLIN SODIUM 1-5 GM-% IV SOLN
INTRAVENOUS | Status: AC
  Filled 2015-05-26: qty 50

## 2015-05-26 MED ORDER — FENTANYL CITRATE (PF) 100 MCG/2ML IJ SOLN
INTRAMUSCULAR | Status: AC | PRN
  Administered 2015-05-26 (×2): 50 ug via INTRAVENOUS

## 2015-05-26 MED ORDER — MIDAZOLAM HCL 2 MG/2ML IJ SOLN
INTRAMUSCULAR | Status: AC | PRN
  Administered 2015-05-26 (×2): 1 mg via INTRAVENOUS

## 2015-05-26 MED ORDER — IOHEXOL 300 MG/ML  SOLN
50.0000 mL | Freq: Once | INTRAMUSCULAR | Status: DC | PRN
  Administered 2015-05-26: 10 mL via INTRAVENOUS
  Filled 2015-05-26: qty 50

## 2015-05-26 MED ORDER — MIDAZOLAM HCL 2 MG/2ML IJ SOLN
INTRAMUSCULAR | Status: AC
Start: 2015-05-26 — End: 2015-05-27
  Filled 2015-05-26: qty 2

## 2015-05-26 MED ORDER — DEXTROSE-NACL 5-0.9 % IV SOLN
INTRAVENOUS | Status: DC
  Administered 2015-05-26 – 2015-05-27 (×2): via INTRAVENOUS

## 2015-05-26 MED ORDER — GLUCAGON HCL RDNA (DIAGNOSTIC) 1 MG IJ SOLR
INTRAMUSCULAR | Status: DC | PRN
  Administered 2015-05-26: 1 mg via INTRAVENOUS

## 2015-05-26 MED ORDER — FENTANYL CITRATE (PF) 100 MCG/2ML IJ SOLN
INTRAMUSCULAR | Status: AC
  Filled 2015-05-26: qty 2

## 2015-05-26 MED ORDER — MAGNESIUM SULFATE 4 GM/100ML IV SOLN
4.0000 g | Freq: Once | INTRAVENOUS | Status: AC
  Administered 2015-05-26: 4 g via INTRAVENOUS
  Filled 2015-05-26: qty 100

## 2015-05-26 MED ORDER — LIDOCAINE HCL 1 % IJ SOLN
INTRAMUSCULAR | Status: AC
  Filled 2015-05-26: qty 20

## 2015-05-26 MED ORDER — DEXTROSE 50 % IV SOLN
INTRAVENOUS | Status: AC
  Administered 2015-05-26: 50 mL
  Filled 2015-05-26: qty 50

## 2015-05-26 NOTE — Progress Notes (Addendum)
Hypoglycemic Event  CBG: 54  Treatment: D50 IV 50 mL  Symptoms: Sweaty, Shaky and Nervous/irritable  Follow-up CBG: Time: 8:09 PM  CBG Result: 147  Possible Reasons for Event: Inadequate meal intake, pt NPO for Gtube placement   Comments/MD notified:[facility], Dr Pearson Grippe, Jennings Blas  Remember to initiate Hypoglycemia Order Set & complete

## 2015-05-26 NOTE — Progress Notes (Addendum)
Pt is refusing CPT at this time. Sat 100%, but BS are rhonchi. RT will try to get pt's mom to help encourage CPT. RT will attempt to do CPT later this shift.  Pt placed on trach collar at this time.

## 2015-05-26 NOTE — Progress Notes (Addendum)
CPT not done for the night due to newly placed PEG tube

## 2015-05-26 NOTE — Progress Notes (Signed)
Pt again refusing CPT at this time

## 2015-05-26 NOTE — Procedures (Signed)
Interventional Radiology Procedure Note  Procedure: Placement of percutaneous 20F pull-through gastrostomy tube. Complications: None Recommendations: - NPO except for sips and chips remainder of today and overnight - Maintain G-tube to LWS until tomorrow morning  - May advance diet as tolerated and begin using tube tomorrow morning  Signed,  Ivah Girardot K. Brodin Gelpi, MD   

## 2015-05-26 NOTE — Progress Notes (Signed)
RT transported patient on vent to IR for a procedure.  The patient was transported back to 2C08 at 15:15. Patient's vitals remained stable throughout.

## 2015-05-26 NOTE — Progress Notes (Addendum)
[facility] Progress Note Patient Name: Jason [name] DOB: [birth year] MRN: [id number]   Date of Service  05/26/2015  HPI/Events of Note  Hypoglycemia. Blood glucose = 54. Getting 1/2 amp D50.   eICU Interventions  Will order: 1. Hypoglycemia protocol. 2. D5 0.9 NaCl to run IV at 50 mL/hour.      Intervention Category Intermediate Interventions: Other:  Lysle Dingwall 05/26/2015, 7:45 PM

## 2015-05-26 NOTE — Progress Notes (Signed)
RT did not perform CPT due to new placement of peg tube.

## 2015-05-26 NOTE — Progress Notes (Signed)
Trach change done. #8 shiley placed at this time.  Vitals WNL and CO2 detector color change positive.  Patient tolerated well.

## 2015-05-26 NOTE — Progress Notes (Signed)
Patient seen for trach team consult.  All needed equipment at bedside.  Mother at bedside; initial education given.  Will provide trach booklet for her to read.  Trach education planned for 05/27/15.  Will continue to follow.

## 2015-05-26 NOTE — Progress Notes (Signed)
Pt stated he wanted to wait until his show was finished and then he would go on ventilator.

## 2015-05-26 NOTE — Progress Notes (Signed)
Dressing changes for back and foot d/c'd yesterday? Called Dr Migdalia Dk to ask about it and she stated to change dressings like order previously stated. Will pass along to day shift to please assess why no longer changing dressings.

## 2015-05-26 NOTE — Progress Notes (Signed)
PULMONARY / CRITICAL CARE MEDICINE   Name: Jason [name] MRN: [id number] DOB: 1986-02-05    ADMISSION DATE: 04/25/2015 TODAY'S DATE: 05/10/2015  REFERRING MD : TRH  CHIEF COMPLAINT: Hypotension  INITIAL PRESENTATION: 29 year old male quad with MRSA bacteremia from multiple sources. Concern for endocarditis. Wounds have been checked and followed by general surgery and ortho. Patient has history of hypotension and concern for septic shock and levophed was started and PCCM consulted. 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 ?  HCAP.   SIGNIFICANT EVENTS: 7/28 >> OR for debridement of osteomyelitis and abscess cx with staph. Positive MRSA and strep blood cx 7/28 >> Reconsulted for extensive post op bleeding and hypotension 7/31 >> worsening respiratory status, acidosis, hypoxemia, hypotension overnight  STUDIES: 7/28 TEE >> NO vegetation, normal LV function  7/31 Bronch >> HCAP, extensive mucous plug 8/2 bronch - no sig obstruction 8/2- family does not want trach at this point even though is recommended 8/3 bronch lungs open, clear, extubated, re intubated lat evening due to collapse 8/4- pt changed decision, trach placed 8/8 weaned 10+ hours 05/22/15: nad on vent/ no new concerns per nursing/ ID note reviewed and strongly rec palliative care eval    SUBJECTIVE/OVERNIGHT/INTERVAL HX No distress. Doing well    VITAL SIGNS: Temp:  [98.1 F (36.7 C)-99 F (37.2 C)] 98.9 F (37.2 C) (08/18 0821) Pulse Rate:  [84-102] 102 (08/18 0821) Resp:  [16-33] 18 (08/18 0821) BP: (100-117)/(56-82) 105/80 mmHg (08/18 0821) SpO2:  [95 %-100 %] 95 % (08/18 0821) FiO2 (%):  [35 %-40 %] 35 % (08/18 0821) Weight:  [54 kg (119 lb 0.8 oz)] 54 kg (119 lb 0.8 oz) (08/18 0445) HEMODYNAMICS:   VENTILATOR SETTINGS: Vent Mode:  [-] PRVC FiO2 (%):  [35 %-40 %] 35 % Set Rate:  [16 bmp] 16 bmp Vt Set:  [570 mL] 570 mL PEEP:  [5 cmH20] 5  cmH20 Plateau Pressure:  [25 cmH20-31 cmH20] 25 cmH20 INTAKE / OUTPUT:  Intake/Output Summary (Last 24 hours) at 05/26/15 1126 Last data filed at 05/26/15 0934  Gross per 24 hour  Intake      6 ml  Output   4525 ml  Net  -4519 ml    PHYSICAL EXAMINATION:  Gen: chronically ill appearing, NAD on ATC HENT: trach clean, dry, good phonation  PULM: resps even non labored on ATC, diminished bases, few scattered rhonchi  CV: RRR, no mgr, trace edema arms, feet GI: BS+, soft, nontender Derm:  sacral wound stage 4 plus Neuro:  Awake, alert, nods approp   LABS:  PULMONARY No results for input(s): PHART, PCO2ART, PO2ART, HCO3, TCO2, O2SAT in the last 168 hours.  Invalid input(s): PCO2, PO2  CBC  Recent Labs Lab 05/23/15 1245 05/25/15 0438 05/26/15 0425  HGB 8.2* 7.8* 8.2*  HCT 25.5* 24.0* 25.4*  WBC 16.9* 13.0* 14.5*  PLT 460* 520* 602*    COAGULATION  Recent Labs Lab 05/20/15 0244 05/26/15 0425  INR 4.44* 1.10    CARDIAC  No results for input(s): TROPONINI in the last 168 hours. No results for input(s): PROBNP in the last 168 hours.   CHEMISTRY  Recent Labs Lab 05/20/15 0244 05/21/15 0326 05/22/15 0348 05/23/15 0251 05/23/15 1245 05/24/15 0850 05/25/15 0438 05/26/15 0425  NA 141 141  --   --  138  --  132*  --   K 4.4 3.9  --   --  4.3  --  4.5  --  CL 112* 114*  --   --  108  --  105  --   CO2 21* 21*  --   --  23  --  22  --   GLUCOSE 129* 194*  --   --  235*  --  247*  --   BUN 16 15  --   --  13  --  13  --   CREATININE 0.42* 0.47*  --  0.41* 0.38*  --  0.43*  --   CALCIUM 7.2* 7.3*  --   --  7.6*  --  7.3*  --   MG 1.5* 1.5* 1.5* 1.5*  --  2.0 1.8 1.6*  PHOS 1.5*  --   --   --  1.6*  --  1.6*  --    Estimated Creatinine Clearance: 103.1 mL/min (by C-G formula based on Cr of 0.43).   LIVER  Recent Labs Lab 05/20/15 0244 05/26/15 0425  INR 4.44* 1.10     INFECTIOUS  Recent Labs Lab 05/23/15 1245 05/24/15 0850 05/25/15 0438   LATICACIDVEN 0.8  --   --   PROCALCITON 2.35 1.32 0.82     ENDOCRINE CBG (last 3)   Recent Labs  05/26/15 0037 05/26/15 0432 05/26/15 0811  GLUCAP 126* 186* 187*      IMAGING x48h  - image(s) personally visualized  -   highlighted in bold No results found.     ASSESSMENT / PLAN:  PULMONARY A:  Acute hypoxemia respiratory failure > due to pulm edema vs HCAP with atelectasis  Pleural effusion R base, small Collapse rt base RML, RLL> improved post bronch   Trach 8/5 >> P:  Continue daytime ATC as tolerated with mandatory qhs vent - Planning for home noct vent Chest PT In-line PMV  Will need family teaching re: vent/trach instructions  CARDIOVASCULAR Right Arm PICC 7/19>>> A:  Shock resolved Relative adrenal insufficiency P:  Observe off steroids Continue Midodrine 20mg  PO TID  RENAL A:  Hypomag Resolved Leaking supra-pubic foley; now s/p new suprapubic cath placed 8/17 P:   Monitor BMET Replace electrolytes as needed  GASTROINTESTINAL OGT A:  Severe malnutrition Dysphagia - failed FEES 8/16 P:  Zofran PRN nausea PPI  Cont TF IR following for G-Tube placement-->for today   HEMATOLOGIC A:  Anemia - due to blood loss from sacral wound and severe peri op bleeding from debridement, dilution >> Transfused 1 unit pRBC 8/12 DVT prevention Leukocytosis -decreasing May 2016 PE IVC filter placed 8/6 P:  Trend CBC in am  SCD Monitor for bleeding   INFECTIOUS BCx2 7/18 >>  MRSA BCx2 7/21 >>MRSA, strep Wound/tissue culture 7/28 >>  Bronch bal 7/31 >> MDR acint  BAL 8/4>>>few GNR>>>acinetobacter  A:  MRSA bacteremia, neg TEE, likely osteomyelitis source E coli UTI  New HCAP likely Once finish abx course, need to remove all lines  MDR acinatobacter Per ID ID following /Rx per ID   P:   Vanc 7/18 >   8/11  Aztreonam 7/18>>>8/3 8/1 cipro>>>8/3 8/3 gent  >> 8/25 (planned) Imipenem >> 8/25 (planned) zyvox >> 8/26  (planned), then vanc >> 9/18 (planned)    ENDOCRINE A:  Possible adrenal insufficiency  T1 DM P:  SSI Observe off steroids  Added low dose lantus 8/17   NEUROLOGIC A:  Quad post trauma Post op pain  Acute encephalopathy 7/31 > improved  P:  RASS goal 0 Continue fent patch & fent prn remeron qhs   DERMS A: stage 4  sacral decub P - Plastics following peripherally & recommends dressing wet to dry bid -

## 2015-05-26 NOTE — Sedation Documentation (Signed)
Patient denies pain and is resting comfortably.  

## 2015-05-26 NOTE — Progress Notes (Signed)
SLP Cancellation Note  Patient Details Name: Jason [name] MRN: [id number] DOB: Jan 08, 1986   Cancelled treatment:         Pt currently going to IR for PEG. Will follow up next date.  [name] 05/26/2015, 2:10 PM   Orbie Pyo Colvin Caroli[employer] [phone]

## 2015-05-27 LAB — GLUCOSE, CAPILLARY
GLUCOSE-CAPILLARY: 194 mg/dL — AB (ref 65–99)
GLUCOSE-CAPILLARY: 200 mg/dL — AB (ref 65–99)
GLUCOSE-CAPILLARY: 158 mg/dL — AB (ref 65–99)
GLUCOSE-CAPILLARY: 108 mg/dL — AB (ref 65–99)
Glucose-Capillary: 142 mg/dL — ABNORMAL HIGH (ref 65–99)
Glucose-Capillary: 143 mg/dL — ABNORMAL HIGH (ref 65–99)

## 2015-05-27 LAB — COMPREHENSIVE METABOLIC PANEL
ALT: 11 U/L — ABNORMAL LOW (ref 17–63)
AST: 14 U/L — ABNORMAL LOW (ref 15–41)
Albumin: 1.1 g/dL — ABNORMAL LOW (ref 3.5–5.0)
Alkaline Phosphatase: 102 U/L (ref 38–126)
Anion gap: 10 (ref 5–15)
BUN: 8 mg/dL (ref 6–20)
CO2: 19 mmol/L — ABNORMAL LOW (ref 22–32)
Calcium: 8 mg/dL — ABNORMAL LOW (ref 8.9–10.3)
Chloride: 107 mmol/L (ref 101–111)
Creatinine, Ser: 0.53 mg/dL — ABNORMAL LOW (ref 0.61–1.24)
GFR calc Af Amer: >60 mL/min (ref >60)
GFR calc non Af Amer: >60 mL/min (ref >60)
Glucose, Bld: 172 mg/dL — ABNORMAL HIGH (ref 65–99)
Potassium: 4.9 mmol/L (ref 3.5–5.1)
Sodium: 136 mmol/L (ref 135–145)
Total Bilirubin: 0.7 mg/dL (ref 0.3–1.2)
Total Protein: 5.9 g/dL — ABNORMAL LOW (ref 6.5–8.1)

## 2015-05-27 LAB — MAGNESIUM: Magnesium: 2.2 mg/dL (ref 1.7–2.4)

## 2015-05-27 MED ORDER — SODIUM CHLORIDE 0.9 % IV BOLUS (SEPSIS)
500.0000 mL | Freq: Once | INTRAVENOUS | Status: AC
  Administered 2015-05-27: 500 mL via INTRAVENOUS

## 2015-05-27 MED ORDER — SODIUM CHLORIDE 0.9 % IV BOLUS (SEPSIS)
1000.0000 mL | Freq: Once | INTRAVENOUS | Status: AC
  Administered 2015-05-27: 1000 mL via INTRAVENOUS

## 2015-05-27 NOTE — Progress Notes (Signed)
eLink Physician-Brief Progress Note Patient Name: Jason [name] DOB: 01/24/1986 MRN: [id number]   Date of Service  05/27/2015  HPI/Events of Note  Baseline sbp 110-130 but now sbp 90s despite midodrine. HR 80s. Afebrile. Looks comfortable  eICU Interventions  1L fluid bolus     Intervention Category Intermediate Interventions: Hypotension - evaluation and management  Easton Sivertson 05/27/2015, 6:24 PM

## 2015-05-27 NOTE — Progress Notes (Signed)
Speech Language Pathology Treatment: Nada Boozer Speaking valve  Patient Details Name: Jason [name] MRN: [id number] DOB: 1986/08/11 Today's Date: 05/27/2015 Time: 4076-8088 SLP Time Calculation (min) (ACUTE ONLY): 17 min  Assessment / Plan / Recommendation Clinical Impression  Speech and swallow intervention today (mom not present during session). Vocal intensity mild-moderately decreased with good intelligibility. No secretions or difficulty, however SpO2 dropped to 80% (good wave form) for 45 seconds; pt in so distress; notified RN. Valve doffed and sats returned to adequate range. RN stated pt dropped to 58% with mom present (mom notified RN). Mom has been trained to doff valve. Requested RN to remind mom to remove valve if sats fall below 88% and notify RN.  Swallow therapy targeted laryngeal elevation with effortful swallows and tongue base retraction with Masako maneuver with max verbal and visual cues; unable to perform Masako this session. Encouraged pt to self practice exercises. Plan is to repeat objective swallow assessment (Mon 8/22) for possible po's in addition to PEG feedings (did not tolerate FEES adequately, therefore will do MBS 8/22).   HPI Other Pertinent Information: 29 year old male quad with MRSA bacteremia from multiple sources. Concern for endocarditis.Patient has history of hypotension and concern for septic shock. 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 Intubated 7/31-8/3, reintubated 8/3 with trach 8/4.   Pertinent Vitals Pain Assessment: No/denies pain  SLP Plan  Continue with current plan of care    Recommendations Medication Administration: Via alternative means      Patient may use Passy-Muir Speech Valve: During all waking hours (remove during sleep) PMSV Supervision: Full MD: Please consider changing trach tube to : Smaller size       Oral Care Recommendations: Oral care QID Follow up Recommendations: 24  hour supervision/assistance Plan: Continue with current plan of care         [name] 05/27/2015, 11:02 AM  Orbie Pyo Colvin [employer] [phone]

## 2015-05-27 NOTE — Progress Notes (Signed)
RT discussed scheduled CPT with Salvadore Dom, NP. Due to recent PEG tube placement, no CPT for 24 hours.

## 2015-05-27 NOTE — Progress Notes (Signed)
CPT not done due to newly placed PEG tube.

## 2015-05-27 NOTE — Progress Notes (Signed)
[facility] Progress Note Patient Name: Jason [name] DOB: 1986/06/26 MRN: [id number]   Date of Service  05/27/2015  HPI/Events of Note  bp still lowish per RN. But MAP still adequate and per eRN writing notes and mentating well  eICU Interventions  500cc fbolus and then monitor. Wil l intervene after this only if MAP is inadqquate or develops iend organ issues      Intervention Category Intermediate Interventions: Other:  Faatima Tench 05/27/2015, 8:33 PM

## 2015-05-27 NOTE — Progress Notes (Signed)
PULMONARY / CRITICAL CARE MEDICINE   Name: Jason [name] MRN: [id number] DOB: January 23, 1986    ADMISSION DATE: 04/25/2015 TODAY'S DATE: 05/10/2015  REFERRING MD : TRH  CHIEF COMPLAINT: Hypotension  INITIAL PRESENTATION: 29 year old male quad with MRSA bacteremia from multiple sources. Concern for endocarditis. Wounds have been checked and followed by general surgery and ortho. Patient has history of hypotension and concern for septic shock and levophed was started and PCCM consulted. 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 ?  HCAP.   SIGNIFICANT EVENTS: 7/28 >> OR for debridement of osteomyelitis and abscess cx with staph. Positive MRSA and strep blood cx 7/28 >> Reconsulted for extensive post op bleeding and hypotension 7/31 >> worsening respiratory status, acidosis, hypoxemia, hypotension overnight  STUDIES: 7/28 TEE >> NO vegetation, normal LV function  7/31 Bronch >> HCAP, extensive mucous plug 8/2 bronch - no sig obstruction 8/2- family does not want trach at this point even though is recommended 8/3 bronch lungs open, clear, extubated, re intubated lat evening due to collapse 8/4- pt changed decision, trach placed 8/8 weaned 10+ hours 05/22/15: nad on vent/ no new concerns per nursing/ ID note reviewed and strongly rec palliative care eval  8/18 home health DMEs evaluated home (OK for home vent). Got PEG tube done in IR. Holding chest PT d/t PEG and discomfort.  8/19 Tubefeeds resumed.   SUBJECTIVE/OVERNIGHT/INTERVAL HX No distress. Doing well    VITAL SIGNS: Temp:  [98.1 F (36.7 C)-98.8 F (37.1 C)] 98.1 F (36.7 C) (08/19 0803) Pulse Rate:  [80-95] 95 (08/19 0827) Resp:  [14-26] 16 (08/19 0827) BP: (103-152)/(71-101) 110/87 mmHg (08/19 0827) SpO2:  [100 %] 100 % (08/19 0827) FiO2 (%):  [35 %-40 %] 35 % (08/19 0827) HEMODYNAMICS:   VENTILATOR SETTINGS: Vent Mode:  [-] PRVC FiO2 (%):  [35 %-40 %] 35 % Set Rate:   [16 bmp] 16 bmp Vt Set:  [570 mL] 570 mL PEEP:  [5 cmH20] 5 cmH20 Plateau Pressure:  [23 cmH20-25 cmH20] 23 cmH20 INTAKE / OUTPUT:  Intake/Output Summary (Last 24 hours) at 05/27/15 1023 Last data filed at 05/27/15 0945  Gross per 24 hour  Intake 951.17 ml  Output   2200 ml  Net -1248.83 ml    PHYSICAL EXAMINATION:  Gen: chronically ill appearing, NAD on ATC HENT: trach clean, dry, good phonation  PULM: resps even non labored on ATC, diminished bases, few scattered rhonchi  CV: RRR, no mgr, trace edema arms, feet GI: BS+, soft, nontender. PEG unremarkable  Derm:  sacral wound stage 4 plus Neuro:  Awake, alert, nods approp   LABS:  PULMONARY No results for input(s): PHART, PCO2ART, PO2ART, HCO3, TCO2, O2SAT in the last 168 hours.  Invalid input(s): PCO2, PO2  CBC  Recent Labs Lab 05/23/15 1245 05/25/15 0438 05/26/15 0425  HGB 8.2* 7.8* 8.2*  HCT 25.5* 24.0* 25.4*  WBC 16.9* 13.0* 14.5*  PLT 460* 520* 602*    COAGULATION  Recent Labs Lab 05/26/15 0425  INR 1.10    CARDIAC  No results for input(s): TROPONINI in the last 168 hours. No results for input(s): PROBNP in the last 168 hours.   CHEMISTRY  Recent Labs Lab 05/21/15 0326  05/23/15 0251 05/23/15 1245 05/24/15 0850 05/25/15 0438 05/26/15 0425 05/27/15 0223  NA 141  --   --  138  --  132*  --  136  K 3.9  --   --  4.3  --  4.5  --  4.9  CL 114*  --   --  108  --  105  --  107  CO2 21*  --   --  23  --  22  --  19*  GLUCOSE 194*  --   --  235*  --  247*  --  172*  BUN 15  --   --  13  --  13  --  8  CREATININE 0.47*  --  0.41* 0.38*  --  0.43*  --  0.53*  CALCIUM 7.3*  --   --  7.6*  --  7.3*  --  8.0*  MG 1.5*  < > 1.5*  --  2.0 1.8 1.6* 2.2  PHOS  --   --   --  1.6*  --  1.6*  --   --   < > = values in this interval not displayed. Estimated Creatinine Clearance: 103.1 mL/min (by C-G formula based on Cr of 0.53).   LIVER  Recent Labs Lab 05/26/15 0425 05/27/15 0223  AST  --   14*  ALT  --  11*  ALKPHOS  --  102  BILITOT  --  0.7  PROT  --  5.9*  ALBUMIN  --  1.1*  INR 1.10  --      INFECTIOUS  Recent Labs Lab 05/23/15 1245 05/24/15 0850 05/25/15 0438  LATICACIDVEN 0.8  --   --   PROCALCITON 2.35 1.32 0.82     ENDOCRINE CBG (last 3)   Recent Labs  05/27/15 0040 05/27/15 0449 05/27/15 0800  GLUCAP 142* 194* 200*      IMAGING x48h  - image(s) personally visualized  -   highlighted in bold Ir Gastrostomy Tube Mod Sed  05/26/2015   CLINICAL DATA:  29 year old male with respiratory failure, deconditioning and protein calorie malnutrition. Percutaneous gastrostomy tube is warranted.  EXAM: PERC PLACEMENT GASTROSTOMY  Date: 05/26/2015  PROCEDURE: 1. Fluoroscopically guided placement of percutaneous pull-through gastrostomy tube. Interventional Radiologist:  Criselda Peaches, MD  ANESTHESIA/SEDATION: Moderate (conscious) sedation was used. 2 mg Versed, 100 mcg Fentanyl were administered intravenously. The patient's vital signs were monitored continuously by radiology nursing throughout the procedure.  Sedation Time: 10 minutes  FLUOROSCOPY TIME:  2 minutes 18 seconds  20.64 mGy  CONTRAST:  68mL OMNIPAQUE IOHEXOL 300 MG/ML  SOLN  MEDICATIONS: Patient is on multiple antibiotics including tobramycin (160 mg) zyvox (600 mg) and primaxin (500 mg). No additional antibiotic prophylaxis was ordered.  TECHNIQUE: Informed consent was obtained from the patient following explanation of the procedure, risks, benefits and alternatives. The patient understands, agrees and consents for the procedure. All questions were addressed. A time out was performed.  Maximal barrier sterile technique utilized including caps, mask, sterile gowns, sterile gloves, large sterile drape, hand hygiene, and chlorhexadine skin prep.  An angled catheter was advanced over a wire under fluoroscopic guidance through the nose, down the esophagus and into the body of the stomach. The stomach was  then insufflated with several 100 ml of air. Fluoroscopy confirmed location of the gastric bubble, as well as inferior displacement of the barium stained colon. Under direct fluoroscopic guidance, a single T-tack was placed, and the anterior gastric wall drawn up against the anterior abdominal wall. Percutaneous access was then obtained into the mid gastric body with an 18 gauge sheath needle. Aspiration of air, and injection of contrast material under fluoroscopy confirmed needle placement.  An Amplatz wire was advanced in the gastric body  and the access needle exchanged for a 9-French vascular sheath. A snare device was advanced through the vascular sheath and an Amplatz wire advanced through the angled catheter. The Amplatz wire was successfully snared and this was pulled up through the esophagus and out the mouth. A 20-French Alinda Dooms MIC-PEG tube was then connected to the snare and pulled through the mouth, down the esophagus, into the stomach and out to the anterior abdominal wall. Hand injection of contrast material confirmed intragastric location. The T-tack retention suture was then cut. The pull through peg tube was then secured with the external bumper and capped.  The patient will be observed for several hours with the newly placed tube on low wall suction to evaluate for any post procedure complication. The patient tolerated the procedure well, there is no immediate complication.  IMPRESSION: Successful placement of a 20 French pull through gastrostomy tube.  Signed,  Criselda Peaches, MD  [facility]  [facility]   Electronically Signed   By: Jacqulynn Cadet M.D.   On: 05/26/2015 16:50       ASSESSMENT / PLAN:  PULMONARY A:  Acute hypoxemia respiratory failure > due to pulm edema vs HCAP with atelectasis  Pleural effusion R base, small Collapse rt base RML, RLL> improved post bronch   Trach 8/5 >> P:  Continue daytime ATC as  tolerated with mandatory qhs vent - Planning for home noct vent Chest PT In-line PMV  Will need family teaching re: vent/trach instructions-->home vent to be delivered soon.   CARDIOVASCULAR Right Arm PICC 7/19>>> A:  Shock resolved Relative adrenal insufficiency P:  Observe off steroids Continue Midodrine 20mg  PO TID  RENAL A:  Hypomag Resolved Leaking supra-pubic foley; now s/p new suprapubic cath placed 8/17 P:   Monitor BMET Replace electrolytes as needed  GASTROINTESTINAL OGT A:  Severe malnutrition Dysphagia - failed FEES 8/16 P:  Zofran PRN nausea PPI  PEG placed 8/18: start tube feeds today (8/19)  HEMATOLOGIC A:  Anemia - due to blood loss from sacral wound and severe peri op bleeding from debridement, dilution >> Transfused 1 unit pRBC 8/12 DVT prevention Leukocytosis -decreasing May 2016 PE IVC filter placed 8/6 P:  Trend CBC in am  SCD Monitor for bleeding   INFECTIOUS BCx2 7/18 >>  MRSA BCx2 7/21 >>MRSA, strep Wound/tissue culture 7/28 >>  Bronch bal 7/31 >> MDR acint  BAL 8/4>>>few GNR>>>acinetobacter  A:  MRSA bacteremia, neg TEE, likely osteomyelitis source E coli UTI  New HCAP likely Once finish abx course, need to remove all lines  MDR acinatobacter Per ID ID following /Rx per ID   P:   Vanc 7/18 >   8/11  Aztreonam 7/18>>>8/3 8/1 cipro>>>8/3 8/3 gent  >> 8/25 (planned) Imipenem >> 8/25 (planned) zyvox >> 8/26 (planned), then vanc >> 9/18 (planned)    ENDOCRINE A:  Possible adrenal insufficiency  T1 DM P:  SSI Observe off steroids  Added low dose lantus 8/17   NEUROLOGIC A:  Quad post trauma Post op pain  Acute encephalopathy 7/31 > improved  P:  RASS goal 0 Continue fent patch & fent prn remeron qhs   DERMS A: stage 4 sacral decub P - Plastics following peripherally & recommends dressing wet to dry bid - needs air/fluid mattress for home      Looking good. Awaiting DME to  deliver home vent, then we need to begin trach/vent training for home.   Erick Colace ACNP-BC [place]  [facility] Pager # [phone] OR # [phone] if no answer

## 2015-05-27 NOTE — Care Management Note (Addendum)
Case Management Note  Patient Details  Name: Jason [name] MRN: [id number] Date of Birth: [birth year]  Subjective/Objective:    Per pt's mother, Dr Migdalia Dk was to order air fluidized therapy bed for pt - she has not been contacted by an equipment company re delivery.  TC to Dr Leafy Ro office x 2, received information that bed would have been ordered by [place].  Unable to speak with anyone @ [place], left 2 messages requesting return call about whether bed has been ordered or if bed needs to be ordered by hospital.                        Girard Cooter, RN 05/27/2015, 3:18 PM  3:54 PM  Received TC from [facility] @ [place] - no one has ordered bed.  TC to [facility], Clearance Coots, who states Medicaid must approve bed before equipment company will deliver same.  Pt's mother has name and contact information for Medicaid worker and will call to determine if bed will be approved.

## 2015-05-27 NOTE — Progress Notes (Signed)
Continuing to hold CPT due to new PEG placement

## 2015-05-27 NOTE — Progress Notes (Signed)
ANTIBIOTIC CONSULT NOTE - FOLLOW UP  Pharmacy Consult for Imipenem, Linezolid, and Tobramycin Indication: Acinetobacter PNA / MRSA bacteremia  Allergies  Allergen Reactions  . Cefuroxime Axetil Anaphylaxis  . Ertapenem Other (See Comments)    Rash and confusion  . Morphine And Related Other (See Comments)    Changed mental status, confusion, headache, visual hallucination  . Penicillins Anaphylaxis and Other (See Comments)    ?can take amoxicillin?  . Sulfa Antibiotics Anaphylaxis, Shortness Of Breath and Other (See Comments)  . Tessalon [Benzonatate] Anaphylaxis  . Shellfish Allergy Itching and Other (See Comments)    Took benadryl to alleviate reaction  . Miripirium Rash and Other (See Comments)    Change in mental status    Patient Measurements: Height: 5\' 8"  (172.7 cm) Weight: 119 lb 0.8 oz (54 kg) IBW/kg (Calculated) : 68.4  Vital Signs: Temp: 98.1 F (36.7 C) (08/19 0803) Temp Source: Axillary (08/19 0803) BP: 110/87 mmHg (08/19 0827) Pulse Rate: 95 (08/19 0827) Intake/Output from previous day: 08/18 0701 - 08/19 0700 In: 1726.7 [I.V.:1172.7; IV Piggyback:554] Out: 3500 [Urine:3500] Intake/Output from this shift: Total I/O In: 100.5 [I.V.:100.5] Out: -   Labs:  Recent Labs  05/25/15 0438 05/26/15 0425 05/27/15 0223  WBC 13.0* 14.5*  --   HGB 7.8* 8.2*  --   PLT 520* 602*  --   CREATININE 0.43*  --  0.53*   Estimated Creatinine Clearance: 103.1 mL/min (by C-G formula based on Cr of 0.53). No results for input(s): VANCOTROUGH, VANCOPEAK, VANCORANDOM, GENTTROUGH, GENTPEAK, GENTRANDOM, TOBRATROUGH, TOBRAPEAK, TOBRARND, AMIKACINPEAK, AMIKACINTROU, AMIKACIN in the last 72 hours.   Microbiology: Recent Results (from the past 720 hour(s))  Culture, blood (routine x 2)     Status: None   Collection Time: 04/28/15  1:20 PM  Result Value Ref Range Status   Specimen Description BLOOD LEFT ARM  Final   Special Requests IN PEDIATRIC BOTTLE 1CC  Final   Culture   Setup Time   Final    GRAM POSITIVE COCCI IN CLUSTERS AEROBIC BOTTLE ONLY CRITICAL RESULT CALLED TO, READ BACK BY AND VERIFIED WITH: Hetty Blend RN 2222 04/30/15 A BROWNING    Culture   Final    MICROAEROPHILIC STREPTOCOCCI Standardized susceptibility testing for this organism is not available. Performed at [facility]    Report Status 05/03/2015 FINAL  Final  Culture, blood (routine x 2)     Status: None   Collection Time: 04/28/15  5:50 PM  Result Value Ref Range Status   Specimen Description BLOOD PICC LINE  Final   Special Requests BOTTLES DRAWN AEROBIC AND ANAEROBIC 5CC EACH  Final   Culture   Final    NO GROWTH 5 DAYS Performed at [facility]    Report Status 05/03/2015 FINAL  Final  Culture, blood (routine x 2)     Status: None   Collection Time: 05/04/15  6:30 PM  Result Value Ref Range Status   Specimen Description BLOOD PICC LINE  Final   Special Requests BOTTLES DRAWN AEROBIC AND ANAEROBIC 5ML  Final   Culture   Final    NO GROWTH 5 DAYS Performed at [facility]    Report Status 05/09/2015 FINAL  Final  Culture, blood (routine x 2)     Status: None   Collection Time: 05/04/15  6:30 PM  Result Value Ref Range Status   Specimen Description BLOOD PICC LINE  Final   Special Requests BOTTLES DRAWN AEROBIC AND ANAEROBIC 5ML  Final  Culture   Final    NO GROWTH 5 DAYS Performed at [facility]    Report Status 05/09/2015 FINAL  Final  Anaerobic culture     Status: None   Collection Time: 05/05/15  1:01 PM  Result Value Ref Range Status   Specimen Description ABSCESS  Final   Special Requests SPEC A ON SWAB RIGHT PELVIC ABSCESS  Final   Gram Stain   Final    FEW WBC PRESENT, PREDOMINANTLY MONONUCLEAR NO SQUAMOUS EPITHELIAL CELLS SEEN FEW GRAM POSITIVE COCCI IN PAIRS IN CLUSTERS Performed at [facility]    Culture   Final    NO ANAEROBES ISOLATED Performed at [facility]    Report Status 05/10/2015 FINAL  Final   Culture, routine-abscess     Status: None   Collection Time: 05/05/15  1:01 PM  Result Value Ref Range Status   Specimen Description ABSCESS  Final   Special Requests SPEC A ON SWAB RIGHT PELVIC ABSCESS  Final   Gram Stain   Final    FEW WBC PRESENT, PREDOMINANTLY MONONUCLEAR NO SQUAMOUS EPITHELIAL CELLS SEEN FEW GRAM POSITIVE COCCI IN PAIRS IN CLUSTERS Performed at [facility]    Culture   Final    MODERATE METHICILLIN RESISTANT STAPHYLOCOCCUS AUREUS Note: RIFAMPIN AND GENTAMICIN SHOULD NOT BE USED AS SINGLE DRUGS FOR TREATMENT OF STAPH INFECTIONS. CRITICAL RESULT CALLED TO, READ BACK BY AND VERIFIED WITH: IREKIA B. 8/1 @216  BY REAMM Performed at [facility]    Report Status 05/09/2015 FINAL  Final   Organism ID, Bacteria METHICILLIN RESISTANT STAPHYLOCOCCUS AUREUS  Final      Susceptibility   Methicillin resistant staphylococcus aureus - MIC*    CLINDAMYCIN >=8 RESISTANT Resistant     ERYTHROMYCIN >=8 RESISTANT Resistant     GENTAMICIN <=0.5 SENSITIVE Sensitive     LEVOFLOXACIN >=8 RESISTANT Resistant     OXACILLIN >=4 RESISTANT Resistant     RIFAMPIN <=0.5 SENSITIVE Sensitive     TRIMETH/SULFA >=320 RESISTANT Resistant     VANCOMYCIN 1 SENSITIVE Sensitive     TETRACYCLINE <=1 SENSITIVE Sensitive     * MODERATE METHICILLIN RESISTANT STAPHYLOCOCCUS AUREUS  Tissue culture     Status: None   Collection Time: 05/05/15  1:13 PM  Result Value Ref Range Status   Specimen Description TISSUE  Final   Special Requests SPEC B IN CUP BONE RIGHT ISCHIUM  Final   Gram Stain   Final    RARE WBC PRESENT, PREDOMINANTLY MONONUCLEAR NO ORGANISMS SEEN Performed at [facility]    Culture   Final    FEW METHICILLIN RESISTANT STAPHYLOCOCCUS AUREUS Note: RIFAMPIN AND GENTAMICIN SHOULD NOT BE USED AS SINGLE DRUGS FOR TREATMENT OF STAPH INFECTIONS. CRITICAL RESULT CALLED TO, READ BACK BY AND VERIFIED WITH: IREKIA B. 8/1 @216  BY REAMM Performed at [facility]    Report Status 05/09/2015 FINAL  Final   Organism ID, Bacteria METHICILLIN RESISTANT STAPHYLOCOCCUS AUREUS  Final      Susceptibility   Methicillin resistant staphylococcus aureus - MIC*    CLINDAMYCIN >=8 RESISTANT Resistant     ERYTHROMYCIN >=8 RESISTANT Resistant     GENTAMICIN <=0.5 SENSITIVE Sensitive     LEVOFLOXACIN >=8 RESISTANT Resistant     OXACILLIN >=4 RESISTANT Resistant     RIFAMPIN <=0.5 SENSITIVE Sensitive     TRIMETH/SULFA >=320 RESISTANT Resistant     VANCOMYCIN 1 SENSITIVE Sensitive     TETRACYCLINE <=1 SENSITIVE Sensitive     *  FEW METHICILLIN RESISTANT STAPHYLOCOCCUS AUREUS  Culture, respiratory (NON-Expectorated)     Status: None   Collection Time: 05/08/15  8:36 AM  Result Value Ref Range Status   Specimen Description TRACHEAL ASPIRATE  Final   Special Requests Normal  Final   Gram Stain   Final    RARE WBC PRESENT, PREDOMINANTLY PMN NO SQUAMOUS EPITHELIAL CELLS SEEN FEW GRAM NEGATIVE RODS RARE GRAM POSITIVE COCCI IN PAIRS RARE YEAST    Culture   Final    ABUNDANT ACINETOBACTER CALCOACETICUS/BAUMANNII COMPLEX Note: TIGECYCLINE  MIC 1 COLISTIN SENSITIVE .0125 ug/mL  ETEST results for this drug are "FOR INVESTIGATIONAL USE ONLY" and should NOT be used for clinical purposes. Performed at [facility]    Report Status 05/12/2015 FINAL  Final   Organism ID, Bacteria ACINETOBACTER CALCOACETICUS/BAUMANNII COMPLEX  Final      Susceptibility   Acinetobacter calcoaceticus/baumannii complex - MIC*    AMPICILLIN >=32 RESISTANT Resistant     AMPICILLIN/SULBACTAM 4 SENSITIVE Sensitive     CEFAZOLIN >=64 RESISTANT Resistant     CEFTAZIDIME >=64 RESISTANT Resistant     CEFTRIAXONE >=64 RESISTANT Resistant     CIPROFLOXACIN >=4 RESISTANT Resistant     GENTAMICIN <=1 SENSITIVE Sensitive     IMIPENEM 0.5 SENSITIVE Sensitive     PIP/TAZO >=128 RESISTANT Resistant     TOBRAMYCIN <=1 SENSITIVE Sensitive     TRIMETH/SULFA >=320 RESISTANT Resistant      * ABUNDANT ACINETOBACTER CALCOACETICUS/BAUMANNII COMPLEX  Culture, blood (routine x 2)     Status: None   Collection Time: 05/10/15  3:05 PM  Result Value Ref Range Status   Specimen Description Blood  Final   Special Requests Normal  Final   Culture  Setup Time TEST WILL BE CREDITED TEST CANCELLED PER RN   Final   Culture TEST WILL BE CREDITED TEST CANCELLED PER RN   Final   Report Status 05/10/2015 FINAL  Final  Culture, respiratory (NON-Expectorated)     Status: None   Collection Time: 05/12/15 12:20 AM  Result Value Ref Range Status   Specimen Description BRONCHIAL ALVEOLAR LAVAGE  Final   Special Requests NONE  Final   Gram Stain   Final    MODERATE WBC PRESENT,BOTH PMN AND MONONUCLEAR RARE SQUAMOUS EPITHELIAL CELLS PRESENT NO ORGANISMS SEEN Performed at [facility]    Culture   Final    FEW CANDIDA ALBICANS FEW ACINETOBACTER CALCOACETICUS/BAUMANNII COMPLEX Performed at [facility]    Report Status 05/15/2015 FINAL  Final   Organism ID, Bacteria ACINETOBACTER CALCOACETICUS/BAUMANNII COMPLEX  Final      Susceptibility   Acinetobacter calcoaceticus/baumannii complex - MIC*    AMPICILLIN >=32 RESISTANT Resistant     AMPICILLIN/SULBACTAM 4 SENSITIVE Sensitive     CEFAZOLIN >=64 RESISTANT Resistant     CEFTAZIDIME >=64 RESISTANT Resistant     CEFTRIAXONE >=64 RESISTANT Resistant     CIPROFLOXACIN >=4 RESISTANT Resistant     GENTAMICIN <=1 SENSITIVE Sensitive     IMIPENEM 0.5 SENSITIVE Sensitive     PIP/TAZO >=128 RESISTANT Resistant     TOBRAMYCIN <=1 SENSITIVE Sensitive     TRIMETH/SULFA >=320 RESISTANT Resistant     * FEW ACINETOBACTER CALCOACETICUS/BAUMANNII COMPLEX  Culture, blood (routine x 2)     Status: None   Collection Time: 05/15/15  5:30 PM  Result Value Ref Range Status   Specimen Description BLOOD BLOOD LEFT HAND  Final   Special Requests BOTTLES DRAWN AEROBIC ONLY [place]  Final   Culture NO  GROWTH 5 DAYS  Final   Report Status  05/20/2015 FINAL  Final  Culture, blood (routine x 2)     Status: None   Collection Time: 05/15/15  6:22 PM  Result Value Ref Range Status   Specimen Description BLOOD RIGHT FOOT  Final   Special Requests IN PEDIATRIC BOTTLE 3CC  Final   Culture NO GROWTH 5 DAYS  Final   Report Status 05/20/2015 FINAL  Final  Culture, respiratory (NON-Expectorated)     Status: None   Collection Time: 05/18/15  7:46 PM  Result Value Ref Range Status   Specimen Description TRACHEAL ASPIRATE  Final   Special Requests NONE  Final   Gram Stain   Final    ABUNDANT WBC PRESENT,BOTH PMN AND MONONUCLEAR RARE SQUAMOUS EPITHELIAL CELLS PRESENT MODERATE GRAM NEGATIVE COCCOBACILLI RARE GRAM POSITIVE COCCI IN PAIRS    Culture   Final    ABUNDANT ACINETOBACTER CALCOACETICUS/BAUMANNII COMPLEX Performed at [facility]    Report Status 05/21/2015 FINAL  Final   Organism ID, Bacteria ACINETOBACTER CALCOACETICUS/BAUMANNII COMPLEX  Final      Susceptibility   Acinetobacter calcoaceticus/baumannii complex - MIC*    AMPICILLIN >=32 RESISTANT Resistant     AMPICILLIN/SULBACTAM 4 SENSITIVE Sensitive     CEFAZOLIN >=64 RESISTANT Resistant     CEFTAZIDIME >=64 RESISTANT Resistant     CEFTRIAXONE >=64 RESISTANT Resistant     CIPROFLOXACIN >=4 RESISTANT Resistant     GENTAMICIN 4 SENSITIVE Sensitive     IMIPENEM 0.5 SENSITIVE Sensitive     PIP/TAZO >=128 RESISTANT Resistant     TOBRAMYCIN <=1 SENSITIVE Sensitive     TRIMETH/SULFA >=320 RESISTANT Resistant     * ABUNDANT ACINETOBACTER CALCOACETICUS/BAUMANNII COMPLEX    Anti-infectives    Start     Dose/Rate Route Frequency Ordered Stop   05/26/15 1431  ceFAZolin (ANCEF) 1-5 GM-% IVPB    Comments:  Hines, Kristopher   : cabinet override      05/26/15 1431 05/27/15 0244   05/25/15 0300  tobramycin (NEBCIN) 160 mg in dextrose 5 % 50 mL IVPB     160 mg 108 mL/hr over 30 Minutes Intravenous Every 36 hours 05/24/15 1438 06/04/15 1459   05/24/15 1500   gentamicin (GARAMYCIN) 120 mg in dextrose 5 % 50 mL IVPB  Status:  Discontinued     120 mg 106 mL/hr over 30 Minutes Intravenous Every 24 hours 05/23/15 1804 05/24/15 1136   05/23/15 2000  imipenem-cilastatin (PRIMAXIN) 500 mg in sodium chloride 0.9 % 100 mL IVPB     500 mg 200 mL/hr over 30 Minutes Intravenous Every 8 hours 05/23/15 1804 06/03/15 0359   05/20/15 1300  linezolid (ZYVOX) IVPB 600 mg     600 mg 300 mL/hr over 60 Minutes Intravenous Every 12 hours 05/20/15 1218     05/20/15 1300  imipenem-cilastatin (PRIMAXIN) 500 mg in sodium chloride 0.9 % 100 mL IVPB  Status:  Discontinued     500 mg 200 mL/hr over 30 Minutes Intravenous 4 times per day 05/20/15 1218 05/23/15 1804   05/20/15 1300  gentamicin (GARAMYCIN) 190 mg in dextrose 5 % 50 mL IVPB  Status:  Discontinued     2.5 mg/kg  75.8 kg 109.5 mL/hr over 30 Minutes Intravenous Every 24 hours 05/20/15 1218 05/23/15 1804   05/18/15 0030  vancomycin (VANCOCIN) IVPB 1000 mg/200 mL premix  Status:  Discontinued     1,000 mg 200 mL/hr over 60 Minutes Intravenous Every 24 hours 05/18/15 0017 05/20/15 1218  05/14/15 0029  vancomycin (VANCOCIN) IVPB 750 mg/150 ml premix  Status:  Discontinued     750 mg 150 mL/hr over 60 Minutes Intravenous Every 24 hours 05/14/15 0029 05/18/15 0017   05/13/15 1000  gentamicin (GARAMYCIN) 520 mg in dextrose 5 % 100 mL IVPB  Status:  Discontinued     7 mg/kg  74.5 kg 113 mL/hr over 60 Minutes Intravenous Every 48 hours 05/12/15 0044 05/17/15 1403   05/12/15 0000  tigecycline (TYGACIL) 50 mg in sodium chloride 0.9 % 100 mL IVPB  Status:  Discontinued     50 mg 200 mL/hr over 30 Minutes Intravenous Every 12 hours 05/11/15 0932 05/20/15 1218   05/11/15 1200  tigecycline (TYGACIL) 100 mg in sodium chloride 0.9 % 100 mL IVPB     100 mg 200 mL/hr over 30 Minutes Intravenous  Once 05/11/15 0932 05/11/15 1443   05/11/15 1015  gentamicin (GARAMYCIN) 520 mg in dextrose 5 % 100 mL IVPB  Status:  Discontinued      7 mg/kg  74.5 kg 113 mL/hr over 60 Minutes Intravenous Every 24 hours 05/11/15 1000 05/12/15 0043   05/09/15 0930  ciprofloxacin (CIPRO) IVPB 400 mg  Status:  Discontinued     400 mg 200 mL/hr over 60 Minutes Intravenous Every 12 hours 05/09/15 0904 05/11/15 0823   05/08/15 0900  aztreonam (AZACTAM) 2 g in dextrose 5 % 50 mL IVPB  Status:  Discontinued     2 g 100 mL/hr over 30 Minutes Intravenous 3 times per day 05/08/15 0826 05/11/15 0823   05/06/15 0000  vancomycin (VANCOCIN) IVPB 750 mg/150 ml premix  Status:  Discontinued     750 mg 150 mL/hr over 60 Minutes Intravenous Every 48 hours 05/05/15 2357 05/14/15 0030   04/28/15 2000  vancomycin (VANCOCIN) IVPB 750 mg/150 ml premix  Status:  Discontinued     750 mg 150 mL/hr over 60 Minutes Intravenous Every 24 hours 04/28/15 0731 05/04/15 1932   04/26/15 0800  vancomycin (VANCOCIN) IVPB 750 mg/150 ml premix  Status:  Discontinued     750 mg 150 mL/hr over 60 Minutes Intravenous Every 12 hours 04/25/15 1715 04/28/15 0731   04/26/15 0200  aztreonam (AZACTAM) 1 g in dextrose 5 % 50 mL IVPB  Status:  Discontinued     1 g 100 mL/hr over 30 Minutes Intravenous Every 8 hours 04/25/15 1919 05/07/15 1035   04/25/15 1715  vancomycin (VANCOCIN) IVPB 750 mg/150 ml premix     750 mg 150 mL/hr over 60 Minutes Intravenous STAT 04/25/15 1715 04/25/15 1906   04/25/15 1645  aztreonam (AZACTAM) 2 g in dextrose 5 % 50 mL IVPB     2 g 100 mL/hr over 30 Minutes Intravenous  Once 04/25/15 1643 04/25/15 1807      Assessment: 29 yo m with complicated infectious diseases course. Patient is wheelchair bound with multiple medical problems.   Day # 8/14 of abx for acinetobacter VAP. Continuing imipenem and tobramycin for 14 days per ID.   Day # 32/63 of abx for MRSA/microaerophilic strep bacteremia. Currently on Linezolid but to switch back to vancomycin on 8/26 and continue treatment until 9/18.  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     Pt is currently afebrile and WBC down to 14.5. TEE on 7/28 was negative for vegetations  Goal of Therapy:  Tobramycin trough level < 2 mcg/mL Tobramycin peak level 6-8 mcg/mL Resolution of infection  Plan:  Continue tobramycin  160 mg IV q36h Continue imipenem 500 mg IV q8h Continue linezolid 600 mg IV q12h Monitor clinical picture, renal function, CBC, Tobra levels at Css  Jason [name] 05/27/2015,12:23 PM

## 2015-05-27 NOTE — Progress Notes (Signed)
S/p percutaneous G-tube in IR 8/18, afebrile, site looks good abdomen soft, NT, no signs of bleeding, (+) BS.  May use G-tube now, floor RN notified.   Tsosie Billing PA-C Interventional Radiology  05/27/15  10:23 AM

## 2015-05-27 NOTE — Progress Notes (Signed)
Patient with low BP. Scheduled Midodrine given, with no improvement. Patient due for dressing change to decubitus ulcers, but family / patient would prefer to wait to have this done after BP is controlled. Paged Dr. Chase Caller for guidance.

## 2015-05-28 LAB — GLUCOSE, CAPILLARY
GLUCOSE-CAPILLARY: 215 mg/dL — AB (ref 65–99)
GLUCOSE-CAPILLARY: 243 mg/dL — AB (ref 65–99)
GLUCOSE-CAPILLARY: 255 mg/dL — AB (ref 65–99)
Glucose-Capillary: 194 mg/dL — ABNORMAL HIGH (ref 65–99)
Glucose-Capillary: 155 mg/dL — ABNORMAL HIGH (ref 65–99)

## 2015-05-28 LAB — MAGNESIUM: Magnesium: 1.7 mg/dL (ref 1.7–2.4)

## 2015-05-28 NOTE — Progress Notes (Signed)
PULMONARY / CRITICAL CARE MEDICINE   Name: Jason [name] MRN: [id number] DOB: 09/06/1986    ADMISSION DATE: 04/25/2015 TODAY'S DATE: 05/10/2015  REFERRING MD : TRH  CHIEF COMPLAINT: Hypotension  INITIAL PRESENTATION: 29 year old male quad with MRSA bacteremia from multiple sources. Concern for endocarditis. Wounds have been checked and followed by general surgery and ortho. Patient has history of hypotension and concern for septic shock and levophed was started and PCCM consulted. 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 ?  HCAP.   SIGNIFICANT EVENTS: 7/28 >> OR for debridement of osteomyelitis and abscess cx with staph. Positive MRSA and strep blood cx 7/28 >> Reconsulted for extensive post op bleeding and hypotension 7/31 >> worsening respiratory status, acidosis, hypoxemia, hypotension overnight 8/18  IR gastrostomy tube, mod sedation  STUDIES: 7/28 TEE >> NO vegetation, normal LV function  7/31 Bronch >> HCAP, extensive mucous plug 8/2 bronch - no sig obstruction 8/2- family does not want trach at this point even though is recommended 8/3 bronch lungs open, clear, extubated, re intubated lat evening due to collapse 8/4- pt changed decision, trach placed 8/8 weaned 10+ hours 05/22/15: nad on vent/ no new concerns per nursing/ ID note reviewed and strongly rec palliative care eval  8/18 home health DMEs evaluated home (OK for home vent). Got PEG tube done in IR. Holding chest PT d/t PEG and discomfort.  8/19 Tubefeeds resumed.   SUBJECTIVE/OVERNIGHT/INTERVAL HX No distress. Doing well. Verbalizes no concerns. Got 1500 ml fluid blouses last night for soft BP   VITAL SIGNS: Temp:  [97.3 F (36.3 C)-98.1 F (36.7 C)] 97.6 F (36.4 C) (08/20 0349) Pulse Rate:  [83-104] 104 (08/20 0800) Resp:  [11-23] 23 (08/20 0800) BP: (89-140)/(40-95) 106/64 mmHg (08/20 0800) SpO2:  [100 %] 100 % (08/20 0800) FiO2 (%):  [35 %-40 %] 35 %  (08/20 0751) Weight:  [54.432 kg (120 lb)-54.885 kg (121 lb)] 54.885 kg (121 lb) (08/20 0500) HEMODYNAMICS:   VENTILATOR SETTINGS: Vent Mode:  [-] PRVC FiO2 (%):  [35 %-40 %] 35 % Set Rate:  [16 bmp] 16 bmp Vt Set:  [570 mL] 570 mL PEEP:  [5 cmH20] 5 cmH20 Plateau Pressure:  [21 cmH20-24 cmH20] 21 cmH20 INTAKE / OUTPUT:  Intake/Output Summary (Last 24 hours) at 05/28/15 0917 Last data filed at 05/28/15 0351  Gross per 24 hour  Intake   1970 ml  Output   1500 ml  Net    470 ml    PHYSICAL EXAMINATION:  Gen: chronically ill appearing, NAD on ATC HENT: trach clean, dry, good phonation through PMV PULM: resps even non labored on ATC, diminished bases, coarse with mild wheeze CV: RRR, no mgr, trace edema arms, feet GI: BS+, soft, nontender. PEG unremarkable  Derm:  sacral wound stage 4 plus Neuro:  Awake, alert, nods approp   LABS:  PULMONARY No results for input(s): PHART, PCO2ART, PO2ART, HCO3, TCO2, O2SAT in the last 168 hours.  Invalid input(s): PCO2, PO2  CBC  Recent Labs Lab 05/23/15 1245 05/25/15 0438 05/26/15 0425  HGB 8.2* 7.8* 8.2*  HCT 25.5* 24.0* 25.4*  WBC 16.9* 13.0* 14.5*  PLT 460* 520* 602*    COAGULATION  Recent Labs Lab 05/26/15 0425  INR 1.10    CARDIAC  No results for input(s): TROPONINI in the last 168 hours. No results for input(s): PROBNP in the last 168 hours.   CHEMISTRY  Recent Labs Lab 05/23/15 0251  05/23/15 1245 05/24/15  3335 05/25/15 0438 05/26/15 0425 05/27/15 0223 05/28/15 0300  NA  --   --  138  --  132*  --  136  --   K  --   < > 4.3  --  4.5  --  4.9  --   CL  --   --  108  --  105  --  107  --   CO2  --   --  23  --  22  --  19*  --   GLUCOSE  --   --  235*  --  247*  --  172*  --   BUN  --   --  13  --  13  --  8  --   CREATININE 0.41*  --  0.38*  --  0.43*  --  0.53*  --   CALCIUM  --   --  7.6*  --  7.3*  --  8.0*  --   MG 1.5*  --   --  2.0 1.8 1.6* 2.2 1.7  PHOS  --   --  1.6*  --  1.6*  --   --    --   < > = values in this interval not displayed. Estimated Creatinine Clearance: 104.8 mL/min (by C-G formula based on Cr of 0.53).   LIVER  Recent Labs Lab 05/26/15 0425 05/27/15 0223  AST  --  14*  ALT  --  11*  ALKPHOS  --  102  BILITOT  --  0.7  PROT  --  5.9*  ALBUMIN  --  1.1*  INR 1.10  --      INFECTIOUS  Recent Labs Lab 05/23/15 1245 05/24/15 0850 05/25/15 0438  LATICACIDVEN 0.8  --   --   PROCALCITON 2.35 1.32 0.82     ENDOCRINE CBG (last 3)   Recent Labs  05/27/15 2019 05/28/15 0025 05/28/15 0349  GLUCAP 108* 215* 194*      IMAGING x48h  - image(s) personally visualized  -   highlighted in bold Ir Gastrostomy Tube Mod Sed  05/26/2015   CLINICAL DATA:  29 year old male with respiratory failure, deconditioning and protein calorie malnutrition. Percutaneous gastrostomy tube is warranted.  EXAM: PERC PLACEMENT GASTROSTOMY  Date: 05/26/2015  PROCEDURE: 1. Fluoroscopically guided placement of percutaneous pull-through gastrostomy tube. Interventional Radiologist:  Criselda Peaches, MD  ANESTHESIA/SEDATION: Moderate (conscious) sedation was used. 2 mg Versed, 100 mcg Fentanyl were administered intravenously. The patient's vital signs were monitored continuously by radiology nursing throughout the procedure.  Sedation Time: 10 minutes  FLUOROSCOPY TIME:  2 minutes 18 seconds  20.64 mGy  CONTRAST:  25mL OMNIPAQUE IOHEXOL 300 MG/ML  SOLN  MEDICATIONS: Patient is on multiple antibiotics including tobramycin (160 mg) zyvox (600 mg) and primaxin (500 mg). No additional antibiotic prophylaxis was ordered.  TECHNIQUE: Informed consent was obtained from the patient following explanation of the procedure, risks, benefits and alternatives. The patient understands, agrees and consents for the procedure. All questions were addressed. A time out was performed.  Maximal barrier sterile technique utilized including caps, mask, sterile gowns, sterile gloves, large sterile drape,  hand hygiene, and chlorhexadine skin prep.  An angled catheter was advanced over a wire under fluoroscopic guidance through the nose, down the esophagus and into the body of the stomach. The stomach was then insufflated with several 100 ml of air. Fluoroscopy confirmed location of the gastric bubble, as well as inferior displacement of the barium stained colon. Under direct fluoroscopic guidance,  a single T-tack was placed, and the anterior gastric wall drawn up against the anterior abdominal wall. Percutaneous access was then obtained into the mid gastric body with an 18 gauge sheath needle. Aspiration of air, and injection of contrast material under fluoroscopy confirmed needle placement.  An Amplatz wire was advanced in the gastric body and the access needle exchanged for a 9-French vascular sheath. A snare device was advanced through the vascular sheath and an Amplatz wire advanced through the angled catheter. The Amplatz wire was successfully snared and this was pulled up through the esophagus and out the mouth. A 20-French Alinda Dooms MIC-PEG tube was then connected to the snare and pulled through the mouth, down the esophagus, into the stomach and out to the anterior abdominal wall. Hand injection of contrast material confirmed intragastric location. The T-tack retention suture was then cut. The pull through peg tube was then secured with the external bumper and capped.  The patient will be observed for several hours with the newly placed tube on low wall suction to evaluate for any post procedure complication. The patient tolerated the procedure well, there is no immediate complication.  IMPRESSION: Successful placement of a 20 French pull through gastrostomy tube.  Signed,  Criselda Peaches, MD  [facility]  [facility]   Electronically Signed   By: Jacqulynn Cadet M.D.   On: 05/26/2015 16:50       ASSESSMENT / PLAN:  PULMONARY A:  Acute  hypoxemia respiratory failure > due to pulm edema vs HCAP with atelectasis  Pleural effusion R base, small Collapse rt base RML, RLL> improved post bronch   Trach 8/5 >> P:  Continue daytime ATC as tolerated with mandatory qhs vent - Planning for home noct vent Chest PT In-line PMV  Will need family teaching re: vent/trach instructions-->home vent to be delivered soon.   CARDIOVASCULAR Right Arm PICC 7/19>>> A:  Shock resolved Relative adrenal insufficiency P:  Observe off steroids Continue Midodrine 20mg  PO TID  RENAL A:  Hypomag Resolved Leaking supra-pubic foley; now s/p new suprapubic cath placed 8/17 P:   Monitor BMET Replace electrolytes as needed  GASTROINTESTINAL OGT A:  Severe malnutrition Dysphagia - failed FEES 8/16 P:  Zofran PRN nausea PPI  PEG placed 8/18: started tube feeds  (8/19)  HEMATOLOGIC A:  Anemia - due to blood loss from sacral wound and severe peri op bleeding from debridement, dilution >> Transfused 1 unit pRBC 8/12 DVT prevention Leukocytosis -decreasing May 2016 PE IVC filter placed 8/6 P:  Trend CBC in am  SCD Monitor for bleeding   INFECTIOUS BCx2 7/18 >>  MRSA BCx2 7/21 >>MRSA, strep Wound/tissue culture 7/28 >>  Bronch bal 7/31 >> MDR acint  BAL 8/4>>>few GNR>>>acinetobacter  A:  MRSA bacteremia, neg TEE, likely osteomyelitis source E coli UTI  New HCAP likely Once finish abx course, need to remove all lines  MDR acinatobacter Per ID ID following /Rx per ID   P:   Vanc 7/18 >   8/11  Aztreonam 7/18>>>8/3 8/1 cipro>>>8/3 8/3 gent  >> 8/25 (planned) Imipenem >> 8/25 (planned) zyvox >> 8/26 (planned), then vanc >> 9/18 (planned)    ENDOCRINE A:  Possible adrenal insufficiency  T1 DM P:  SSI Observe off steroids  Added low dose lantus 8/17   NEUROLOGIC A:  Quad post trauma Post op pain  Acute encephalopathy 7/31 > improved  P:  RASS goal 0 Continue fent patch & fent  prn remeron qhs  DERMS A: stage 4 sacral decub P - Plastics following peripherally & recommends dressing wet to dry bid - needs air/fluid mattress for home       Awaiting DME to deliver home vent, then we need to begin trach/vent training for home.   [place], [place] [phone] After 3:00 pm [phone]

## 2015-05-28 NOTE — Progress Notes (Signed)
Patient requested to be placed on vent around 12AM tonight when he falls asleep. Vitals are stable and patient is not in any respiratory distress at this time. RT will continue to monitor. Patient also refused CPT at this time.

## 2015-05-29 LAB — MAGNESIUM: MAGNESIUM: 1.6 mg/dL — AB (ref 1.7–2.4)

## 2015-05-29 LAB — GLUCOSE, CAPILLARY
GLUCOSE-CAPILLARY: 243 mg/dL — AB (ref 65–99)
GLUCOSE-CAPILLARY: 153 mg/dL — AB (ref 65–99)
GLUCOSE-CAPILLARY: 159 mg/dL — AB (ref 65–99)
Glucose-Capillary: 158 mg/dL — ABNORMAL HIGH (ref 65–99)
Glucose-Capillary: 160 mg/dL — ABNORMAL HIGH (ref 65–99)
Glucose-Capillary: 161 mg/dL — ABNORMAL HIGH (ref 65–99)
Glucose-Capillary: 191 mg/dL — ABNORMAL HIGH (ref 65–99)

## 2015-05-29 IMAGING — CT CT MAXILLOFACIAL W/ CM
3 series · 16 of 47 positions shown, 19 images · IV contrast (omnipaque)
Comparison: None.

CLINICAL DATA: Left upper mouth pain.

CT MAXILLOFACIAL WITH CONTRAST
TECHNIQUE: Multidetector CT imaging of the maxillofacial
structures was performed with intravenous contrast. Multiplanar CT
image reconstructions were also generated.
Contrast: 80mL OMNIPAQUE IOHEXOL 300 MG/ML  SOLN

[Series 3: maxillofacial 2.0 h30s st · axial · 0.37mm/px · z∈[-266,-114]mm · 10 of 90 slices shown, 13 images]
[im 7/90  brain]
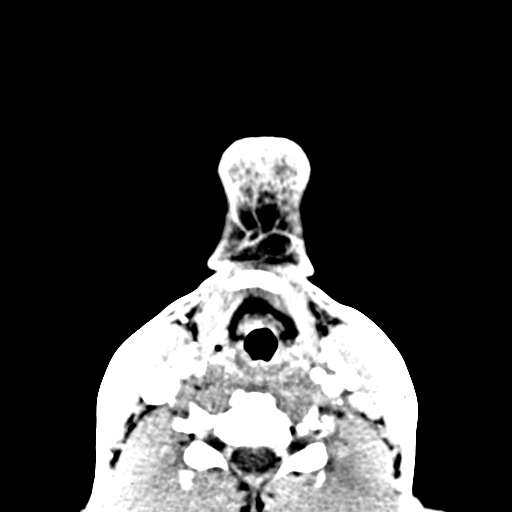
[im 7/90  bone]
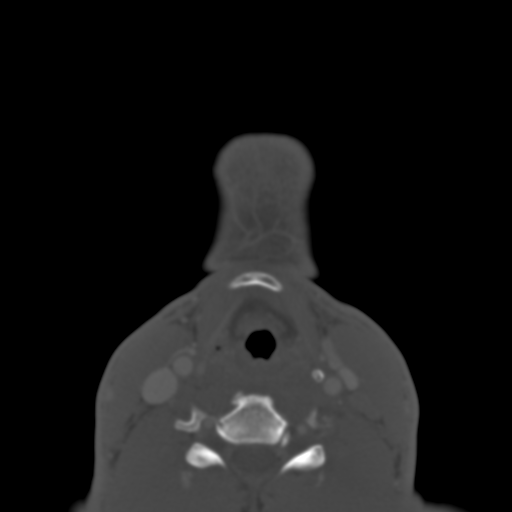
[im 16/90  bone]
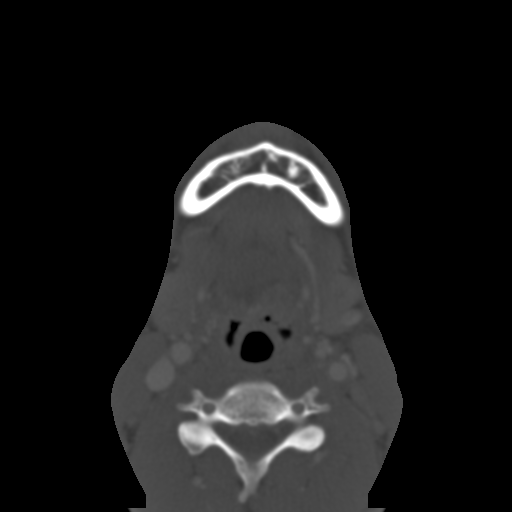
[im 25/90  bone]
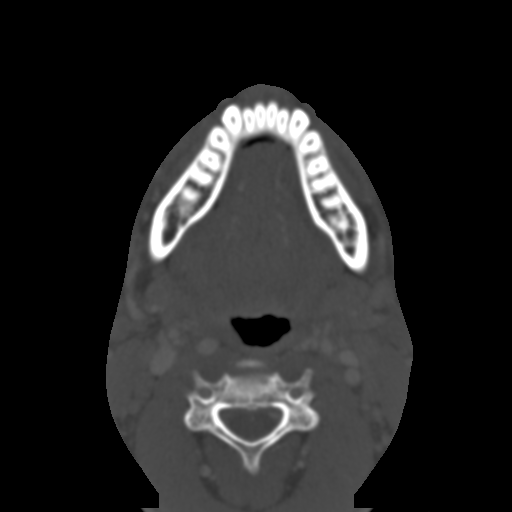
[im 31/90  bone]
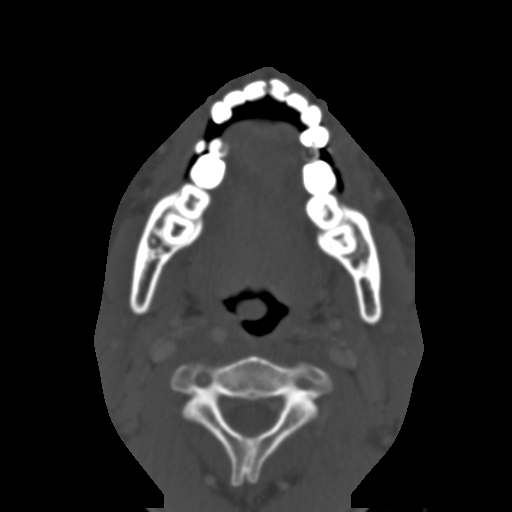
[im 40/90  brain]
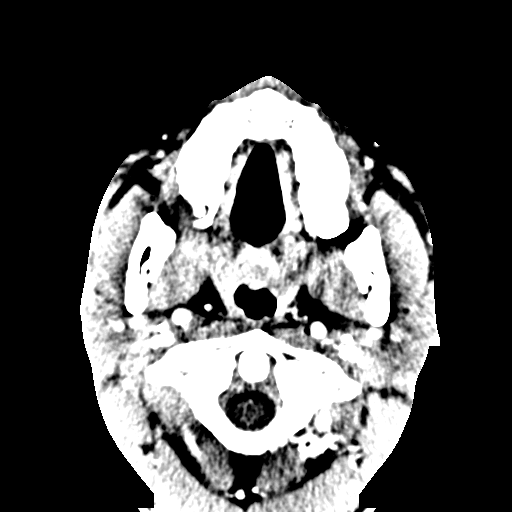
[im 40/90  bone]
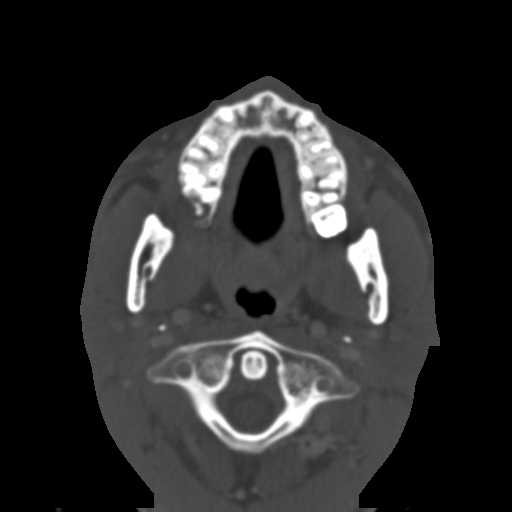
[im 50/90  bone]
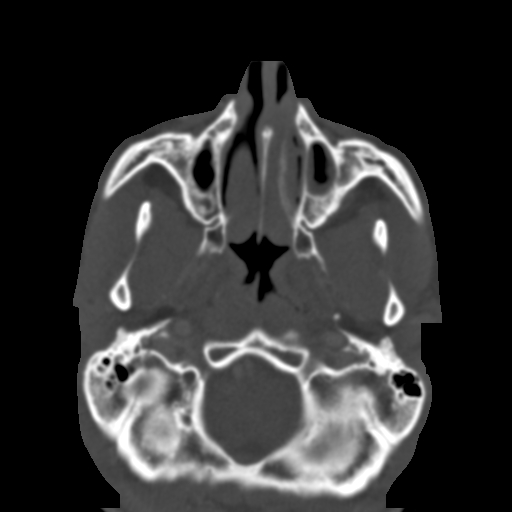
[im 59/90  bone]
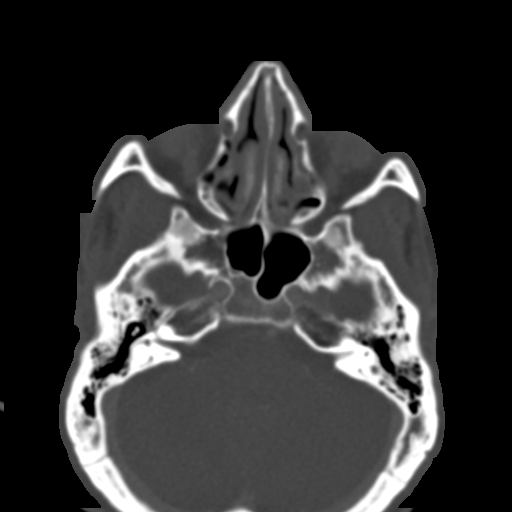
[im 68/90  bone]
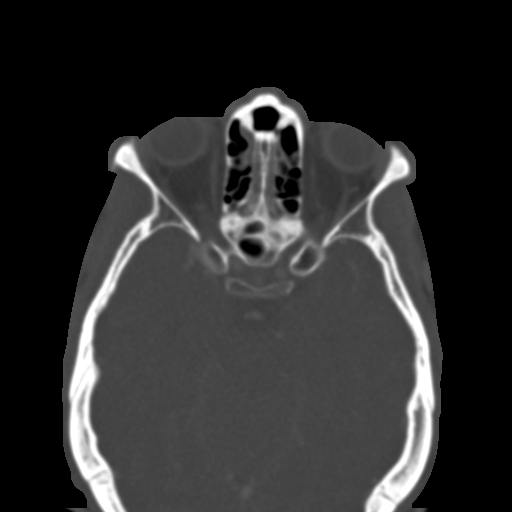
[im 74/90  brain]
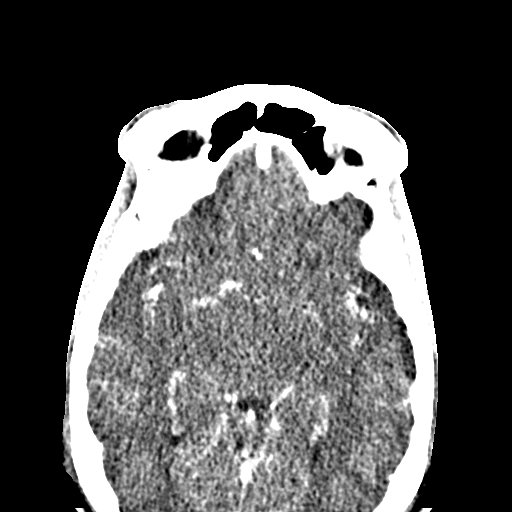
[im 74/90  bone]
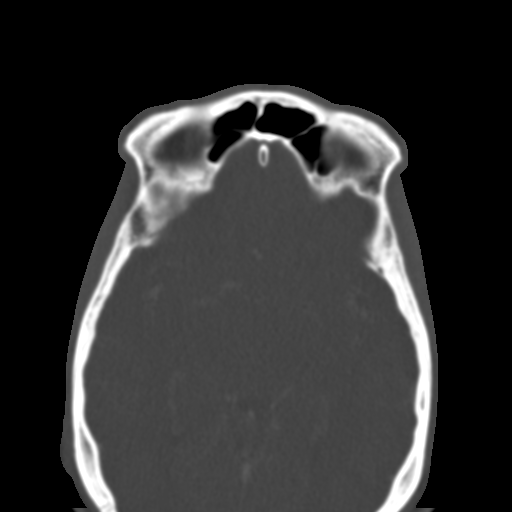
[im 83/90  bone]
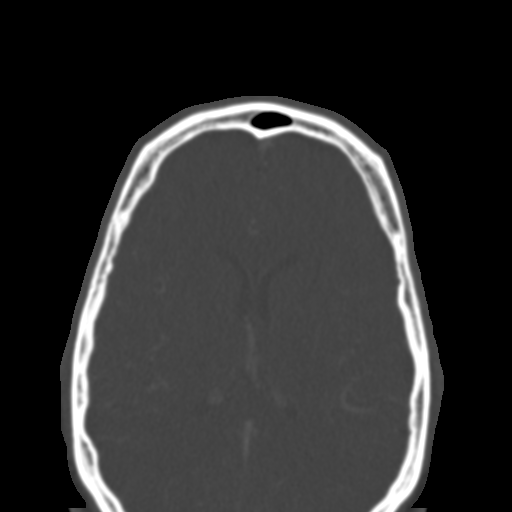

[Series 8: maxillofacial 2.0 coronal · coronal · 0.36mm/px · 3 of 87 slices shown]
[im 29/87  bone]
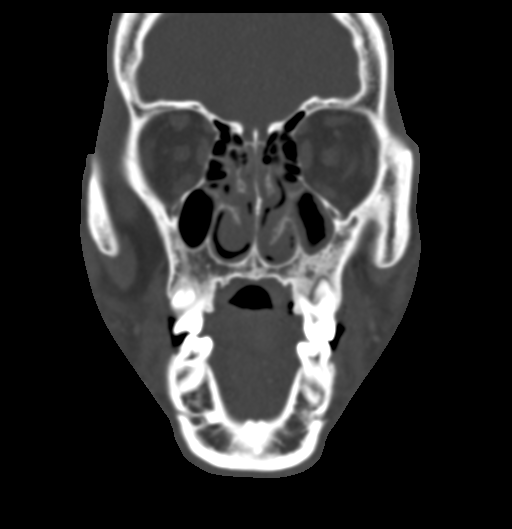
[im 39/87  bone]
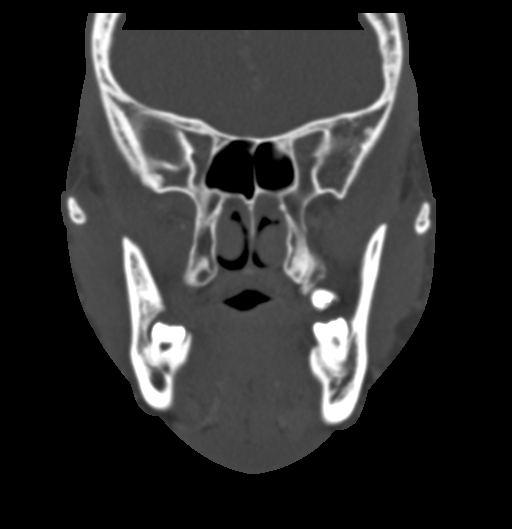
[im 48/87  bone]
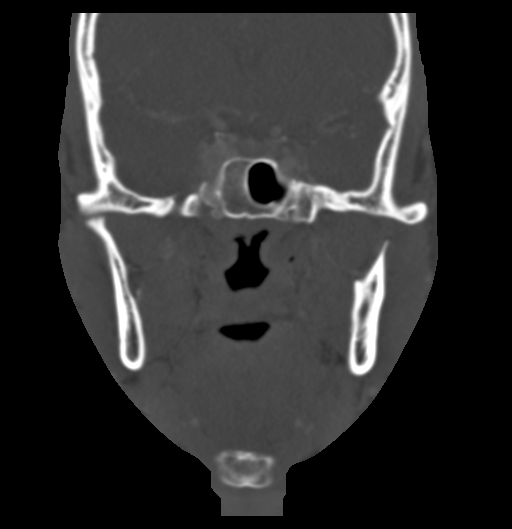

[Series 9: maxillofacial 2.0 sagittal · sagittal · 0.37mm/px · 3 of 77 slices shown]
[im 26/77  bone]
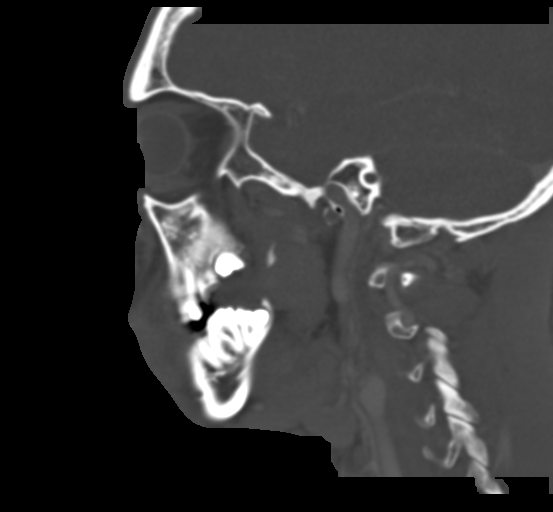
[im 39/77  bone]
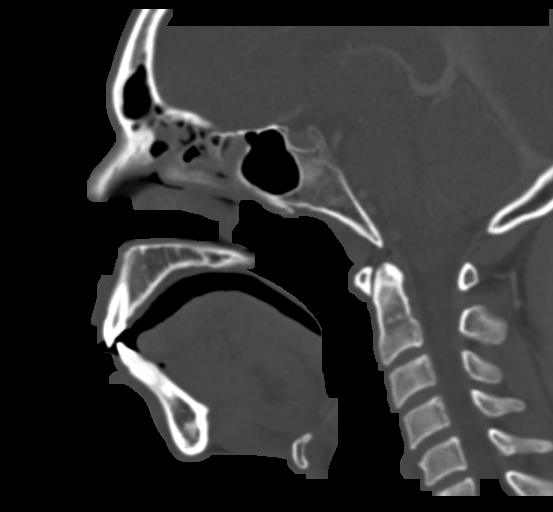
[im 51/77  bone]
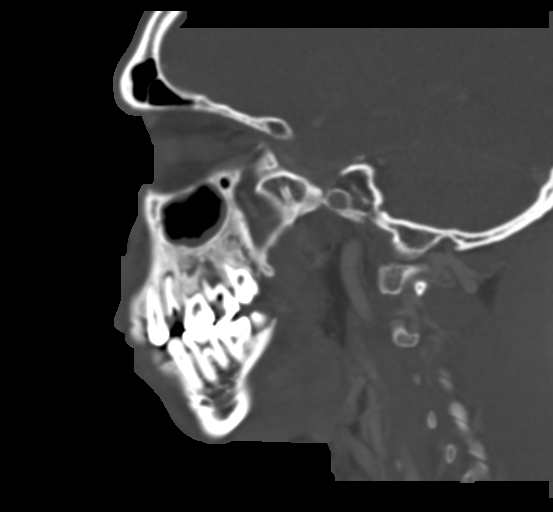

[16 of 47 positions shown; findings below may reference images not displayed]

FINDINGS: Multiple dental caries are demonstrated bilaterally.
Peri apical lucencies are suggested at the left upper second molar
suggesting periodontal disease.  No discrete abscess is
demonstrated.  No abnormal soft tissue and contrast enhancement or
fluid collection.  Mucosal membrane thickening in the left
maxillary antrum and bilateral ethmoid air cells consistent with
chronic inflammation.  No acute air-fluid levels are demonstrated
in the paranasal sinuses.  Prominent level II lymph nodes are
demonstrated on the left consistent with reactive nodes.
Visualized orbital and facial bones appear intact without displaced
fracture identified.  Deformity of the nasal bones suggest old
fracture deformity.  Visualized cervical carotid and jugular
vessels are patent.  Salivary glands appear symmetrical.  No
displacement of the cervical fat planes.  No prevertebral soft
tissue swelling or mucosal infiltration is demonstrated.
IMPRESSION: Poor dentition with multiple dental caries.  Peri apical lucency
around the left second molar.  No discrete abscess is demonstrated.
Chronic inflammatory changes in the paranasal sinuses.

## 2015-05-29 IMAGING — CR DG CHEST 2V
2 series · 2 of 2 positions shown · non-contrast
Comparison: 02/15/2010.

CLINICAL DATA: Complaint of fever, neuropathy, and dental pain.

CHEST - 2 VIEW

[w chest pa]
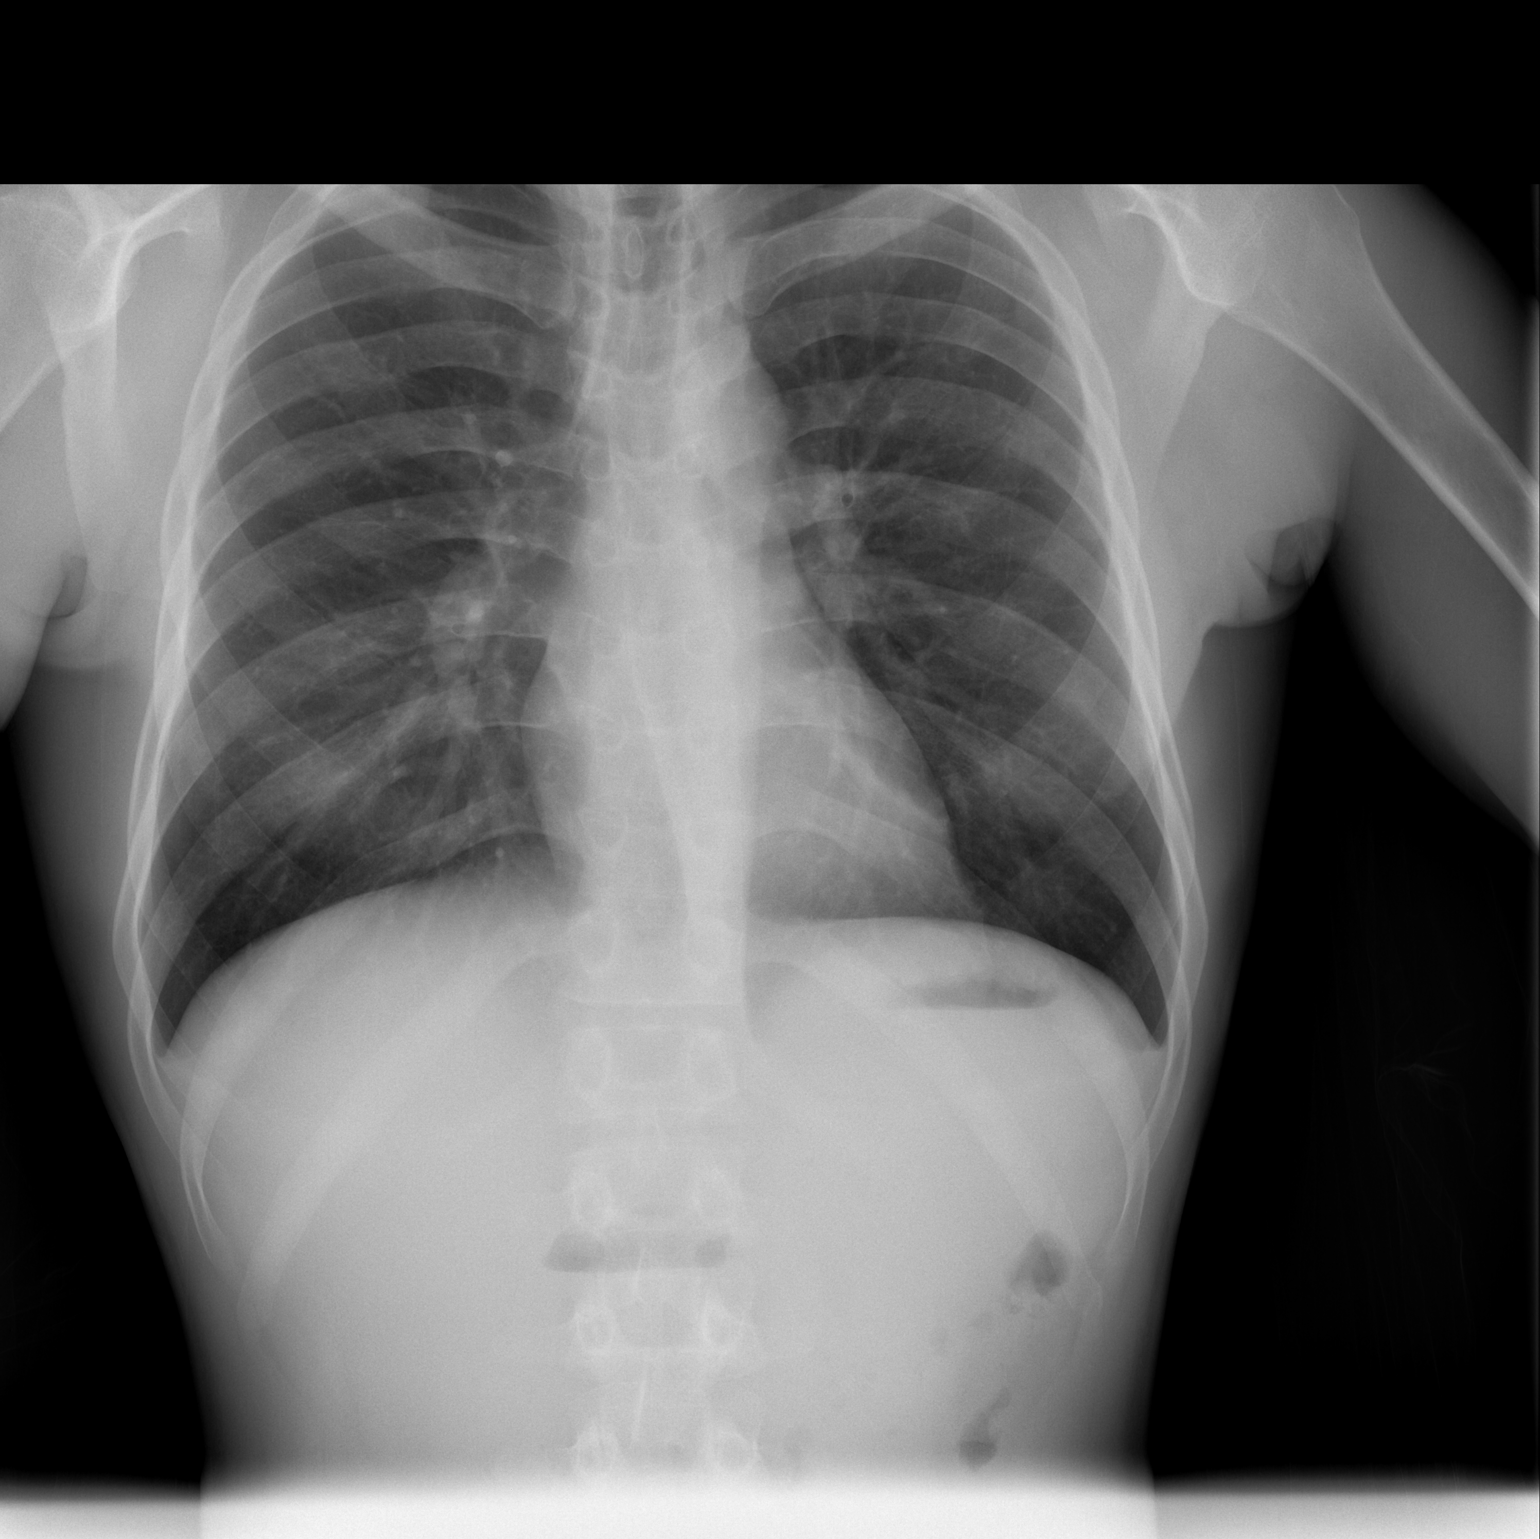

[w chest lat]
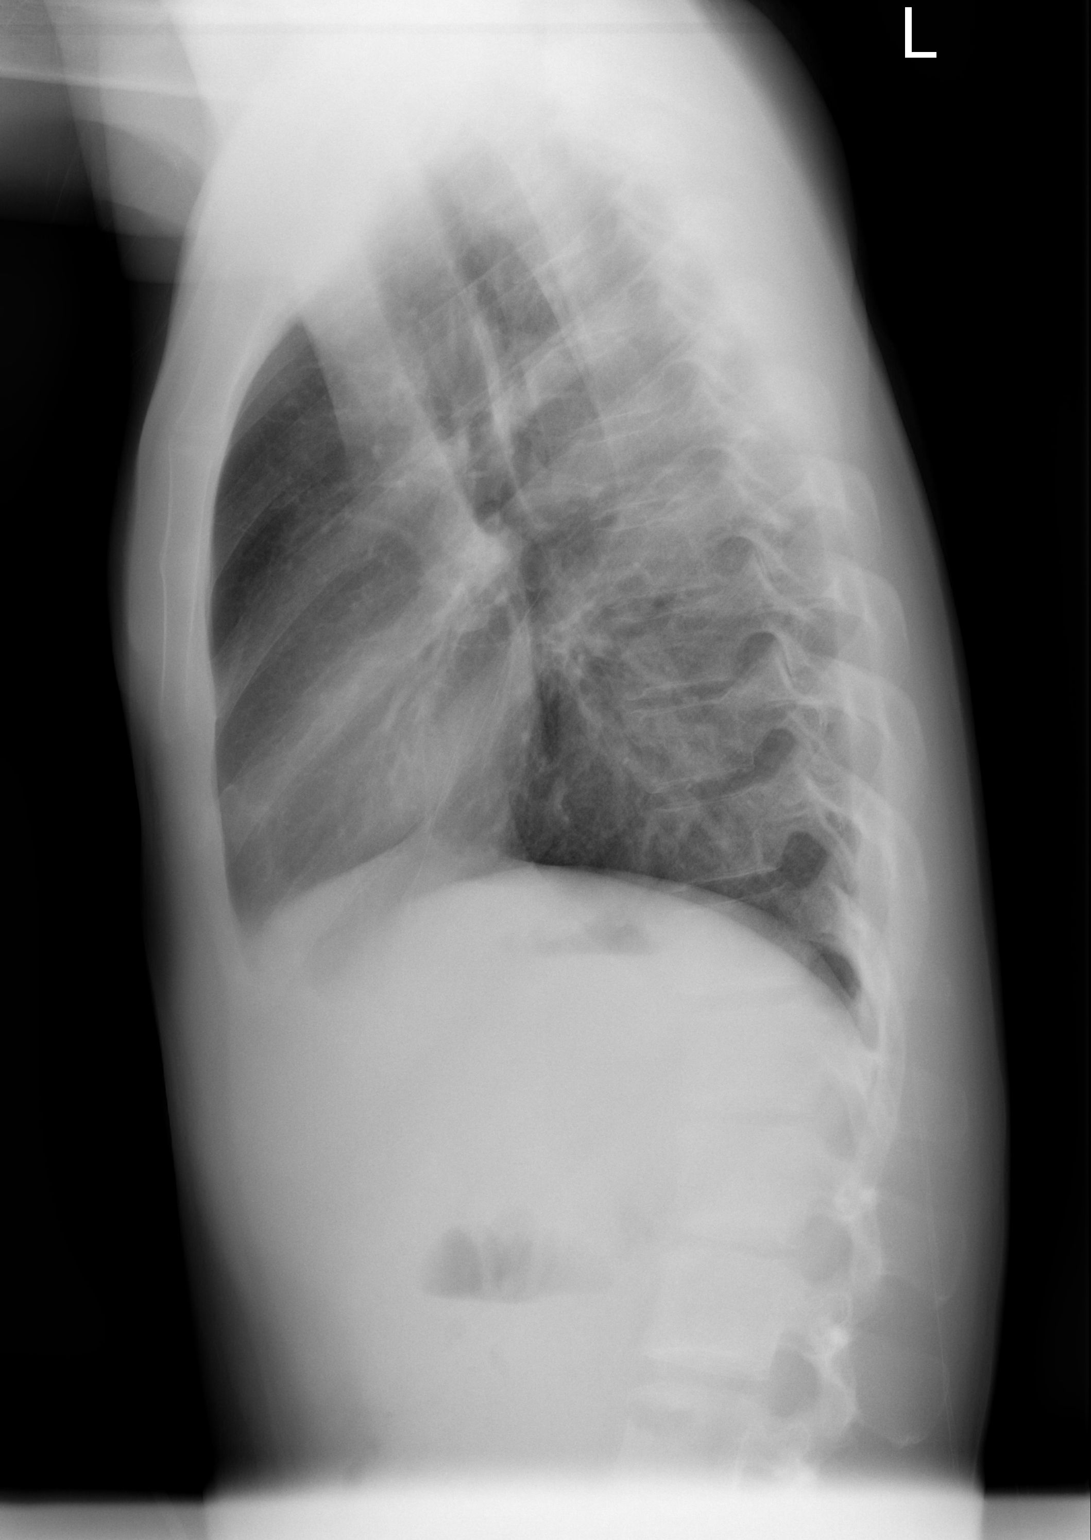

[2 of 2 positions shown; findings below may reference images not displayed]

FINDINGS: The heart size and pulmonary vascularity are normal. The
lungs appear clear and expanded without focal air space disease or
consolidation. No blunting of the costophrenic angles.  No
pneumothorax.  Mediastinal contours appear intact.  No significant
changes since the previous study.
IMPRESSION: No evidence of active pulmonary disease.

## 2015-05-29 NOTE — Progress Notes (Signed)
Placed patient on ventilator for the night. He is tolerating it well and vitals are stable ( HR 102 SpO2 100% RR 17). RT will continue to monitor.

## 2015-05-29 NOTE — Progress Notes (Signed)
PULMONARY / CRITICAL CARE MEDICINE   Name: Jason [name] MRN: [id number] DOB: [birth year]    ADMISSION DATE: 04/25/2015 TODAY'S DATE: 05/10/2015  REFERRING MD : TRH  CHIEF COMPLAINT: Hypotension  INITIAL PRESENTATION: 29 year old male quad with MRSA bacteremia from multiple sources. Concern for endocarditis. Wounds have been checked and followed by general surgery and ortho. Patient has history of hypotension and concern for septic shock and levophed was started and PCCM consulted. 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 ?  HCAP.   SIGNIFICANT EVENTS: 7/28 >> OR for debridement of osteomyelitis and abscess cx with staph. Positive MRSA and strep blood cx 7/28 >> Reconsulted for extensive post op bleeding and hypotension 7/31 >> worsening respiratory status, acidosis, hypoxemia, hypotension overnight 8/18  IR gastrostomy tube, mod sedation  STUDIES: 7/28 TEE >> NO vegetation, normal LV function  7/31 Bronch >> HCAP, extensive mucous plug 8/2 bronch - no sig obstruction 8/2- family does not want trach at this point even though is recommended 8/3 bronch lungs open, clear, extubated, re intubated lat evening due to collapse 8/4- pt changed decision, trach placed 8/8 weaned 10+ hours 05/22/15: nad on vent/ no new concerns per nursing/ ID note reviewed and strongly rec palliative care eval  8/18 home health DMEs evaluated home (OK for home vent). Got PEG tube done in IR. Holding chest PT d/t PEG and discomfort.  8/19 Tubefeeds resumed.   SUBJECTIVE/OVERNIGHT/INTERVAL HX No distress. Doing well. He has some concern about ability to communicate while on home vent. 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  VITAL SIGNS: Temp:  [97.4 F (36.3 C)-98.2 F (36.8 C)] 98.2 F (36.8 C) (08/21 0755) Pulse Rate:  [78-102] 98 (08/21 1000) Resp:  [10-23] 16 (08/21 1000) BP: (81-135)/(18-97) 91/53 mmHg (08/21  1000) SpO2:  [97 %-100 %] 100 % (08/21 1216) FiO2 (%):  [21 %-40 %] 21 % (08/21 1216) HEMODYNAMICS:   VENTILATOR SETTINGS: Vent Mode:  [-] PRVC FiO2 (%):  [21 %-40 %] 21 % Set Rate:  [16 bmp] 16 bmp Vt Set:  [570 mL] 570 mL Plateau Pressure:  [29 cmH20] 29 cmH20 INTAKE / OUTPUT:  Intake/Output Summary (Last 24 hours) at 05/29/15 1218 Last data filed at 05/29/15 1100  Gross per 24 hour  Intake 3621.33 ml  Output   4475 ml  Net -853.67 ml    PHYSICAL EXAMINATION:  Gen: chronically ill appearing, NAD on ATC HENT: trach clean, dry, good phonation through PMV PULM: resps even non labored on ATC, diminished bases, coarse with mild wheeze CV: RRR, no mgr, trace edema arms, feet GI: BS+, soft, nontender. PEG unremarkable  Derm:  sacral wound stage 4 plus Neuro:  Awake, alert, nods approp   LABS:  PULMONARY No results for input(s): PHART, PCO2ART, PO2ART, HCO3, TCO2, O2SAT in the last 168 hours.  Invalid input(s): PCO2, PO2  CBC  Recent Labs Lab 05/23/15 1245 05/25/15 0438 05/26/15 0425  HGB 8.2* 7.8* 8.2*  HCT 25.5* 24.0* 25.4*  WBC 16.9* 13.0* 14.5*  PLT 460* 520* 602*    COAGULATION  Recent Labs Lab 05/26/15 0425  INR 1.10    CARDIAC  No results for input(s): TROPONINI in the last 168 hours. No results for input(s): PROBNP in the last 168 hours.   CHEMISTRY  Recent Labs Lab 05/23/15 0251  05/23/15 1245  05/25/15 0940 05/26/15 0425 05/27/15 0223 05/28/15 0300 05/29/15 7680  NA  --   --  138  --  132*  --  136  --   --   K  --   < > 4.3  --  4.5  --  4.9  --   --   CL  --   --  108  --  105  --  107  --   --   CO2  --   --  23  --  22  --  19*  --   --   GLUCOSE  --   --  235*  --  247*  --  172*  --   --   BUN  --   --  13  --  13  --  8  --   --   CREATININE 0.41*  --  0.38*  --  0.43*  --  0.53*  --   --   CALCIUM  --   --  7.6*  --  7.3*  --  8.0*  --   --   MG 1.5*  --   --   < > 1.8 1.6* 2.2 1.7 1.6*  PHOS  --   --  1.6*  --  1.6*  --    --   --   --   < > = values in this interval not displayed. Estimated Creatinine Clearance: 104.8 mL/min (by C-G formula based on Cr of 0.53).   LIVER  Recent Labs Lab 05/26/15 0425 05/27/15 0223  AST  --  14*  ALT  --  11*  ALKPHOS  --  102  BILITOT  --  0.7  PROT  --  5.9*  ALBUMIN  --  1.1*  INR 1.10  --      INFECTIOUS  Recent Labs Lab 05/23/15 1245 05/24/15 0850 05/25/15 0438  LATICACIDVEN 0.8  --   --   PROCALCITON 2.35 1.32 0.82     ENDOCRINE CBG (last 3)   Recent Labs  05/29/15 0033 05/29/15 0355 05/29/15 0839  GLUCAP 191* 161* 160*      IMAGING x48h  - image(s) personally visualized  -   highlighted in bold No results found.     ASSESSMENT / PLAN:  PULMONARY A:  Acute hypoxemia respiratory failure > due to pulm edema vs HCAP with atelectasis  Pleural effusion R base, small Collapse rt base RML, RLL> improved post bronch   Trach 8/5 >> P:  Continue daytime ATC as tolerated with mandatory qhs vent - Planning for home noct vent Chest PT In-line PMV  Will need family teaching re: vent/trach instructions-->home vent to be delivered 8/22 and will need settings prescribed at that time. [place] will be able to help with home communication needs.   CARDIOVASCULAR Right Arm PICC 7/19>>> A:  Shock resolved Relative adrenal insufficiency P:  Observe off steroids Continue Midodrine 20mg  PO TID  RENAL A:  Hypomag Resolved Leaking supra-pubic foley; now s/p new suprapubic cath placed 8/17 P:   Monitor BMET - recheck 8/22 Replace electrolytes as needed  GASTROINTESTINAL OGT A:  Severe malnutrition Dysphagia - failed FEES 8/16 P:  Zofran PRN nausea PPI  PEG placed 8/18: started tube feeds  (8/19)  HEMATOLOGIC A:  Anemia - due to blood loss from sacral wound and severe peri op bleeding from debridement, dilution >> Transfused 1 unit pRBC 8/12 DVT prevention Leukocytosis -decreasing May 2016 PE IVC filter  placed 8/6 P:  Trend CBC in am  SCD Monitor for bleeding  INFECTIOUS BCx2 7/18 >>  MRSA BCx2 7/21 >>MRSA, strep Wound/tissue culture 7/28 >>  Bronch bal 7/31 >> MDR acint  BAL 8/4>>>few GNR>>>acinetobacter  A:  MRSA bacteremia, neg TEE, likely osteomyelitis source E coli UTI  New HCAP likely Once finish abx course, need to remove all lines  MDR acinatobacter Per ID ID following /Rx per ID   P:   Vanc 7/18 >   8/11  Aztreonam 7/18>>>8/3 8/1 cipro>>>8/3 8/3 gent  >> 8/25 (planned) Imipenem >> 8/25 (planned) zyvox >> 8/26 (planned), then vanc >> 9/18 (planned)    ENDOCRINE A:  Possible adrenal insufficiency  T1 DM P:  SSI Observe off steroids  Added low dose lantus 8/17   NEUROLOGIC A:  Quad post trauma Post op pain  Acute encephalopathy 7/31 > improved  P:  RASS goal 0 Continue fent patch & fent prn remeron qhs   DERMS A: stage 4 sacral decub P - Plastics following peripherally & recommends dressing wet to dry bid - needs air/fluid mattress for home       Awaiting DME to deliver home vent, then we need to begin trach/vent training for home.  BMET, CBC, CXR for 8/22   [place], MD [place] [phone] After 3:00 pm [place]

## 2015-05-29 NOTE — Progress Notes (Signed)
Patient is refusing to be placed on vent at this time. He is saying that he will go on after his bath at 4am. I have stressed the importance of the doctor's request of mandatory qhs ventilator but he continues to refuse. Will plan to place patient on vent at 4am. No respiratory distress noted. RT will continue to monitor.

## 2015-05-29 NOTE — Progress Notes (Signed)
Placed patient on ventilator and he is tolerating it well. He is concerned about not being able to communicate his needs while on ventilator. RT will continue to monitor.

## 2015-05-30 ENCOUNTER — Inpatient Hospital Stay (HOSPITAL_COMMUNITY): Payer: Medicaid Other

## 2015-05-30 LAB — CBC WITH DIFFERENTIAL/PLATELET
BASOS ABS: 0.1 10*3/uL (ref 0.0–0.1)
BASOS PCT: 1 % (ref 0–1)
EOS ABS: 0.2 10*3/uL (ref 0.0–0.7)
EOS PCT: 2 % (ref 0–5)
HCT: 27.5 % — ABNORMAL LOW (ref 39.0–52.0)
Hemoglobin: 8.9 g/dL — ABNORMAL LOW (ref 13.0–17.0)
Lymphocytes Relative: 25 % (ref 12–46)
Lymphs Abs: 3.1 10*3/uL (ref 0.7–4.0)
MCH: 29.6 pg (ref 26.0–34.0)
MCHC: 32.4 g/dL (ref 30.0–36.0)
MCV: 91.4 fL (ref 78.0–100.0)
Monocytes Absolute: 1.4 10*3/uL — ABNORMAL HIGH (ref 0.1–1.0)
Monocytes Relative: 11 % (ref 3–12)
NEUTROS PCT: 61 % (ref 43–77)
Neutro Abs: 7.7 10*3/uL (ref 1.7–7.7)
PLATELETS: 699 10*3/uL — AB (ref 150–400)
RBC: 3.01 MIL/uL — AB (ref 4.22–5.81)
RDW: 14.9 % (ref 11.5–15.5)
WBC: 12.5 10*3/uL — AB (ref 4.0–10.5)

## 2015-05-30 LAB — BASIC METABOLIC PANEL
ANION GAP: 8 (ref 5–15)
BUN: 15 mg/dL (ref 6–20)
CHLORIDE: 105 mmol/L (ref 101–111)
CO2: 20 mmol/L — AB (ref 22–32)
Calcium: 8.2 mg/dL — ABNORMAL LOW (ref 8.9–10.3)
Creatinine, Ser: 0.46 mg/dL — ABNORMAL LOW (ref 0.61–1.24)
GFR calc non Af Amer: >60 mL/min (ref >60)
Glucose, Bld: 249 mg/dL — ABNORMAL HIGH (ref 65–99)
POTASSIUM: 5.2 mmol/L — AB (ref 3.5–5.1)
SODIUM: 133 mmol/L — AB (ref 135–145)

## 2015-05-30 LAB — GLUCOSE, CAPILLARY
GLUCOSE-CAPILLARY: 154 mg/dL — AB (ref 65–99)
GLUCOSE-CAPILLARY: 164 mg/dL — AB (ref 65–99)
GLUCOSE-CAPILLARY: 248 mg/dL — AB (ref 65–99)
GLUCOSE-CAPILLARY: 289 mg/dL — AB (ref 65–99)
Glucose-Capillary: 201 mg/dL — ABNORMAL HIGH (ref 65–99)
Glucose-Capillary: 291 mg/dL — ABNORMAL HIGH (ref 65–99)

## 2015-05-30 LAB — MAGNESIUM: MAGNESIUM: 1.3 mg/dL — AB (ref 1.7–2.4)

## 2015-05-30 MED ORDER — MAGNESIUM SULFATE 2 GM/50ML IV SOLN
2.0000 g | Freq: Once | INTRAVENOUS | Status: AC
  Administered 2015-05-30: 2 g via INTRAVENOUS
  Filled 2015-05-30: qty 50

## 2015-05-30 MED ORDER — FUROSEMIDE 10 MG/ML IJ SOLN
20.0000 mg | Freq: Once | INTRAMUSCULAR | Status: AC
  Administered 2015-05-30: 20 mg via INTRAVENOUS
  Filled 2015-05-30: qty 2

## 2015-05-30 MED ORDER — SODIUM CHLORIDE 0.9 % IV SOLN
250.0000 mg | Freq: Four times a day (QID) | INTRAVENOUS | Status: DC
  Administered 2015-05-30 – 2015-06-01 (×9): 250 mg via INTRAVENOUS
  Filled 2015-05-30 (×12): qty 250

## 2015-05-30 NOTE — Care Management Note (Signed)
Case Management Note  Patient Details  Name: Jason [name] MRN: [id number] Date of Birth: 09/11/1986  Subjective/Objective:    Completed documentation for [facility] [place] from [employer] then received TC from liaison stating they were not the approved provider - referring CM to Medical [facility].  Left message @ MM requesting return call.  Also called Family Medicine @ [facility] to determine that Dr [name] is pt's PCP.                  Expected Discharge Plan:  [place]  Discharge planning Services  CM Consult  Post Acute Care Choice:  Home Health Choice offered to:  Parent  DME Arranged:  Trach supplies, Ventilator, Oxygen DME Agency:  [facility]  HH Arranged:  RN [place] Agency:  [name]  Status of Service:  In process, will continue to follow  Girard Cooter, RN 05/30/2015, 1:38 PM

## 2015-05-30 NOTE — Progress Notes (Signed)
[name] concerned about patient's blood pressure. Elink Dr. Dorinda Hill made aware and request continued monitoring since patient is receiving midodrine and significant doses of fentanyl and ativan . [name] updated.

## 2015-05-30 NOTE — Progress Notes (Signed)
Speech Pathology    MBSS complete. Full report located under chart review in imaging section.  Double click on DG swallow.    Orbie Pyo [place].[employer] [phone]

## 2015-05-30 NOTE — Progress Notes (Signed)
PULMONARY / CRITICAL CARE MEDICINE   Name: Jason [name] MRN: [id number] DOB: April 09, 1986    ADMISSION DATE: 04/25/2015 TODAY'S DATE: 05/10/2015  REFERRING MD : TRH  CHIEF COMPLAINT: Hypotension  INITIAL PRESENTATION: 29 year old male quad with MRSA bacteremia from multiple sources. Concern for endocarditis. Wounds have been checked and followed by general surgery and ortho. Patient has history of hypotension and concern for septic shock and levophed was started and PCCM consulted. 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 ?  HCAP.   SIGNIFICANT EVENTS: 7/28 >> OR for debridement of osteomyelitis and abscess cx with staph. Positive MRSA and strep blood cx 7/28 >> Reconsulted for extensive post op bleeding and hypotension 7/31 >> worsening respiratory status, acidosis, hypoxemia, hypotension overnight 8/18  IR gastrostomy tube, mod sedation  STUDIES: 7/28 TEE >> NO vegetation, normal LV function  7/31 Bronch >> HCAP, extensive mucous plug 8/2 bronch - no sig obstruction 8/2- family does not want trach at this point even though is recommended 8/3 bronch lungs open, clear, extubated, re intubated lat evening due to collapse 8/4- pt changed decision, trach placed 8/8 weaned 10+ hours 05/22/15: nad on vent/ no new concerns per nursing/ ID note reviewed and strongly rec palliative care eval  8/18 home health DMEs evaluated home (OK for home vent). Got PEG tube done in IR. Holding chest PT d/t PEG and discomfort.  8/19 Tubefeeds resumed.  8/22 failed swallow eval   SUBJECTIVE/OVERNIGHT/INTERVAL HX No distress.   VITAL SIGNS: Temp:  [97.1 F (36.2 C)-98.1 F (36.7 C)] 98 F (36.7 C) (08/22 1210) Pulse Rate:  [96-121] 96 (08/22 1300) Resp:  [14-25] 16 (08/22 1300) BP: (67-156)/(34-106) 103/64 mmHg (08/22 1300) SpO2:  [94 %-100 %] 97 % (08/22 1300) FiO2 (%):  [21 %-40 %] 21 % (08/22 1210) Weight:  [50.803 kg (112 lb)-51.256 kg  (113 lb)] 51.256 kg (113 lb) (08/22 0443) HEMODYNAMICS:   VENTILATOR SETTINGS: Vent Mode:  [-] PRVC FiO2 (%):  [21 %-40 %] 21 % Set Rate:  [16 bmp] 16 bmp Vt Set:  [570 mL] 570 mL PEEP:  [5 cmH20] 5 cmH20 Plateau Pressure:  [27 cmH20-29 cmH20] 28 cmH20 INTAKE / OUTPUT:  Intake/Output Summary (Last 24 hours) at 05/30/15 1353 Last data filed at 05/30/15 1001  Gross per 24 hour  Intake   2273 ml  Output   2325 ml  Net    -52 ml    PHYSICAL EXAMINATION:  Gen: chronically ill appearing, NAD on ATC HENT: trach clean, dry, good phonation through PMV PULM: resps even non labored on ATC, diminished bases, occ rhonchi  CV: RRR, no mgr, trace edema arms, feet GI: BS+, soft, nontender. PEG unremarkable  Derm:  sacral wound stage 4 plus Neuro:  Awake, alert, nods approp   LABS:  CBC  Recent Labs Lab 05/25/15 0438 05/26/15 0425 05/30/15 0521  HGB 7.8* 8.2* 8.9*  HCT 24.0* 25.4* 27.5*  WBC 13.0* 14.5* 12.5*  PLT 520* 602* 699*   CHEMISTRY  Recent Labs Lab 05/25/15 0438 05/26/15 0425 05/27/15 0223 05/28/15 0300 05/29/15 0313 05/30/15 0521  NA 132*  --  136  --   --  133*  K 4.5  --  4.9  --   --  5.2*  CL 105  --  107  --   --  105  CO2 22  --  19*  --   --  20*  GLUCOSE 247*  --  172*  --   --  249*  BUN 13  --  8  --   --  15  CREATININE 0.43*  --  0.53*  --   --  0.46*  CALCIUM 7.3*  --  8.0*  --   --  8.2*  MG 1.8 1.6* 2.2 1.7 1.6* 1.3*  PHOS 1.6*  --   --   --   --   --    Estimated Creatinine Clearance: 98 mL/min (by C-G formula based on Cr of 0.46).   ENDOCRINE CBG (last 3)   Recent Labs  05/30/15 0458 05/30/15 0725 05/30/15 1225  GLUCAP 201* 248* 164*      IMAGING x48h  - image(s) personally visualized  -   highlighted in bold Dg Chest Port 1 View  05/30/2015   CLINICAL DATA:  Pneumonia and tracheostomy  EXAM: PORTABLE CHEST - 1 VIEW  COMPARISON:  05/21/2015  FINDINGS: Tracheostomy tube remains well seated. Nasogastric tube is been removed.   Normal heart size and stable upper mediastinal contours, including abnormal rounded appearance of the aortic knob where there was adenopathy on chest CT 02/20/2015.  Improved lung aeration, although there is still bilateral infrahilar airspace disease. Bilateral layering pleural effusion, with fissural extension on the right. No air leak.  IMPRESSION: 1. Improved pneumonia, currently seen in the bilateral infrahilar lung. 2. Bilateral layering pleural effusion.   Electronically Signed   By: Monte Fantasia M.D.   On: 05/30/2015 07:45   Dg Swallowing Func-speech Pathology  05/30/2015    Objective Swallowing Evaluation:   Modified Barium Swallow Patient Details  Name: Jason [name] MRN: [id number] Date of Birth: 05/09/1986  Today's Date: 05/30/2015 Time: SLP Start Time (ACUTE ONLY): 1035-SLP Stop Time (ACUTE ONLY): 1051 SLP Time Calculation (min) (ACUTE ONLY): 16 min  Past Medical History:  Past Medical History  Diagnosis Date  . GERD (gastroesophageal reflux disease)   . Asthma   . Hx MRSA infection     on face  . Gastroparesis   . Diabetic neuropathy   . Seizures   . Family history of anesthesia complication     Pt mother can't have epidural procedures  . Dysrhythmia   . Pneumonia   . Arthritis   . Fibromyalgia   . 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 Type I diabetes mellitus     sees Dr. Loanne Drilling   . Syncope 02/16/2015   Past Surgical History:  Past Surgical History  Procedure Laterality Date  . Tonsillectomy    . Multiple extractions with alveoloplasty N/A 08/03/2014    Procedure: MULTIPLE EXTRACTIONS;  Surgeon: Gae Bon, DDS;   Location: [place];  Service: Oral Surgery;  Laterality: N/A;  . Tee without cardioversion N/A 08/17/2014    Procedure: TRANSESOPHAGEAL ECHOCARDIOGRAM (TEE);  Surgeon: Dorothy Spark, MD;  Location: [facility] ENDOSCOPY;  Service: Cardiovascular;   Laterality: N/A;  . Debridment of decubitus ulcer N/A 10/04/2014    Procedure: DEBRIDMENT OF DECUBITUS ULCER;  Surgeon:  Georganna Skeans, MD;   Location: [place];  Service: General;  Laterality: N/A;  . Laparoscopic diverted colostomy N/A 10/12/2014    Procedure: LAPAROSCOPIC DIVERTING COLOSTOMY;  Surgeon: Donnie Mesa,  MD;  Location: [place];  Service: General;  Laterality: N/A;  . Insertion of suprapubic catheter N/A 10/12/2014    Procedure: INSERTION OF SUPRAPUBIC CATHETER;  Surgeon: Reece Packer, MD;  Location: [place];  Service: Urology;  Laterality: N/A;  . Incision and drainage of wound N/A 11/12/2014  Procedure: IRRIGATION AND DEBRIDEMENT OF WOUNDS WITH BONE BIOPSY AND  SURGICAL PREP ;  Surgeon: Theodoro Kos, DO;  Location: [place];  Service:  Plastics;  Laterality: N/A;  . Application of a-cell of back N/A 11/12/2014    Procedure: APPLICATION A CELL AND VAC ;  Surgeon: Theodoro Kos, DO;   Location: [place];  Service: Plastics;  Laterality: N/A;  . Incision and drainage of wound N/A 11/18/2014    Procedure: IRRIGATION AND DEBRIDEMENT OF SACRAL ULCER ONLY WITH  PLACEMENT OF A CELL AND VAC/ DRESSING CHANGE TO UPPER BACK AREA.;   Surgeon: Theodoro Kos, DO;  Location: [place];  Service: Plastics;   Laterality: N/A;  . Incision and drainage of wound N/A 11/25/2014    Procedure: IRRIGATION AND DEBRIDEMENT OF SACRAL ULCER AND BACK BURN WITH  PLACEMENT OF A-CELL;  Surgeon: Theodoro Kos, DO;  Location: [place];   Service: Plastics;  Laterality: N/A;  . Minor application of wound vac N/A 11/25/2014    Procedure:  WOUND VAC CHANGE;  Surgeon: Theodoro Kos, DO;  Location: [place];  Service: Plastics;  Laterality: N/A;  . Incision and drainage of wound Right 12/08/2014    Procedure: IRRIGATION AND DEBRIDEMENT SACRAL WOUND AND RIGHT ISCHIAL  WOUND ;  Surgeon: Theodoro Kos, DO;  Location: [place];  Service: Plastics;   Laterality: Right;  . Application of a-cell of back N/A 12/08/2014    Procedure: APPLICATION OF A-CELL AND WOUND VAC ;  Surgeon: Theodoro Kos, DO;  Location: [place];  Service: Plastics;  Laterality: N/A;  . Tee without cardioversion N/A  05/05/2015    Procedure: TRANSESOPHAGEAL ECHOCARDIOGRAM (TEE);  Surgeon: Lelon Perla, MD;  Location: [place];  Service: Cardiovascular;   Laterality: N/A;  . Incision and drainage of wound Bilateral 05/05/2015    Procedure: IRRIGATION AND DEBRIDEMENT SACRAL ULCER;  Surgeon: Theodoro Kos, DO;  Location: [place];  Service: Plastics;  Laterality: Bilateral;  . Hematoma evacuation N/A 05/05/2015    Procedure: EVACUATION HEMATOMA bedside procedure;  Surgeon: Theodoro Kos, DO;  Location: [place];  Service: Plastics;  Laterality: N/A;   HPI:  Other Pertinent Information: 29 year old male quad with MRSA bacteremia  from multiple sources. Concern for endocarditis.Patient has history of  hypotension and concern for septic shock. Source of shock was identified  as osteomyelitis of sacral decub, and it was debrided. Intubated 7/31-8/3,  reintubated 8/3 with trach 8/4. NPO recommended following FEES 8/16,  received PEG 8/18. Repeat objective assessment with MBS (should tolerate  better) for possible pleasure po.  No Data Recorded  Assessment / Plan / Recommendation CHL IP CLINICAL IMPRESSIONS 05/30/2015  Therapy Diagnosis Mild oral phase dysphagia;Moderate pharyngeal phase  dysphagia  Clinical Impression Pt exhibited mild oral dysphagia with delayed  anterior-posterior transit. Moderate sensorimotor pharyngeal phase  dysphagia with decreased sensation resulting in delayed swallow initiation  to valleculae. Silent laryngeal penetration to vocal cords with nectar and  honey and into vestibule with puree (silent). Reduced tongue base  retraction led to moderate vallecular residue and decreased laryngeal  elevation resulting in mild-moderate pyriform sinus residue. Verbal cues  for multiple swallows and throat clear were ineffective (SLP assist for  quad cough) to clear penetrates or decrease residue. Aspiration risk high  currently, however prognosis for po's is good.          CHL IP TREATMENT RECOMMENDATION 05/30/2015   Treatment Recommendations Therapy as outlined in treatment plan below     CHL  IP DIET RECOMMENDATION 05/30/2015  SLP Diet Recommendations NPO  Liquid Administration via (None)  Medication Administration Via alternative means  Compensations (None)  Postural Changes and/or Swallow Maneuvers (None)     CHL IP OTHER RECOMMENDATIONS 05/30/2015  Recommended Consults (None)  Oral Care Recommendations Oral care QID  Other Recommendations (None)     CHL IP FOLLOW UP RECOMMENDATIONS 05/27/2015  Follow up Recommendations 24 hour supervision/assistance     CHL IP FREQUENCY AND DURATION 05/30/2015  Speech Therapy Frequency (ACUTE ONLY) min 2x/week  Treatment Duration 2 weeks     Pertinent Vitals/Pain none    SLP Swallow Goals No flowsheet data found.  No flowsheet data found.    CHL IP REASON FOR REFERRAL 05/30/2015  Reason for Referral Objectively evaluate swallowing function               No flowsheet data found.         [name] 05/30/2015, 11:39 AM  Orbie Pyo Colvin Caroli.Ed Engineer, agricultural [phone]       ASSESSMENT / PLAN:  PULMONARY A:  Acute hypoxemia respiratory failure > due to pulm edema vs HCAP with atelectasis  Pleural effusion R base, small Collapse rt base RML, RLL> improved post bronch   Trach 8/5 >> P:  Continue daytime ATC as tolerated with mandatory qhs vent  Planning for home noct vent Hope we can do home vent nocturnal observation tonight (has completed the home inspection and family instructions) Chest PT In-line PMV    CARDIOVASCULAR Right Arm PICC 7/19>>> A:  Shock resolved Relative adrenal insufficiency P:  Observe off steroids Continue Midodrine 20mg  PO TID  RENAL A:  Hypomag Mild hyperkalemia  Resolved Leaking supra-pubic foley; now s/p new suprapubic cath placed 8/17 P:  Lasix x1 Replace Mg Monitor BMET - recheck 8/23 Replace electrolytes as needed  GASTROINTESTINAL OGT A:  Severe malnutrition Dysphagia - failed FEES 8/16 P:  Zofran PRN  nausea PPI  PEG placed 8/18: started tube feeds  (8/19)  HEMATOLOGIC A:  Anemia - due to blood loss from sacral wound and severe peri op bleeding from debridement, dilution >> Transfused 1 unit pRBC 8/12 DVT prevention Leukocytosis -decreasing May 2016 PE IVC filter placed 8/6 P:  Trend CBC in am  SCD Monitor for bleeding   INFECTIOUS BCx2 7/18 >>  MRSA BCx2 7/21 >>MRSA, strep Wound/tissue culture 7/28 >>  Bronch bal 7/31 >> MDR acint  BAL 8/4>>>few GNR>>>acinetobacter  A:  MRSA bacteremia, neg TEE, likely osteomyelitis source E coli UTI  New HCAP likely Once finish abx course, need to remove all lines  MDR acinatobacter Per ID ID following /Rx per ID   P:   Vanc 7/18 >   8/11  Aztreonam 7/18>>>8/3 8/1 cipro>>>8/3 8/3 gent  >> 8/25 (planned) Imipenem >> 8/25 (planned) zyvox >> 8/26 (planned), then vanc >> 9/18 (planned)    ENDOCRINE A:  Possible adrenal insufficiency  T1 DM P:  SSI Observe off steroids  Added low dose lantus 8/17   NEUROLOGIC A:  Quad post trauma Post op pain  Acute encephalopathy 7/31 > improved  P:  RASS goal 0 Continue fent patch & fent prn remeron qhs   DERMS A: stage 4 sacral decub P - Plastics following peripherally & recommends dressing wet to dry bid - needs air/fluid mattress for home (ordered)    Working towards home vent. Likely wednesday   Erick Colace ACNP-BC [place] Pager # [phone] OR # [phone] if no answer  Attending Note:  29 year old quad post trauma presenting with would infection and septic shock originally. Had to be placed back on the vent. I placed him on wean this AM and to my surprise he tolerated it well. Bibasilar crackles but tolerated. I reviewed his CXR myself, evidence of hypoinflation. Discussed with PCCM-NP. Mother updated bedside and all questions answered. Will continue abx as ordered. That is still to be determined. Hold in SDU for  now.  Failed swallow evaluation today.  Will continue feeding via PEG.  Spoke with CSW, will begin mother staying in the room tonight and manage vent and anticipate discharge over the next few days.  Rush Farmer, M.D. [facility] Pulmonary/Critical Care Medicine. Pager: [phone]. After hours pager: [phone].

## 2015-05-30 NOTE — Progress Notes (Signed)
Pt refusing CPT at this time. 

## 2015-05-30 NOTE — Progress Notes (Signed)
ANTIBIOTIC CONSULT NOTE - FOLLOW UP  Pharmacy Consult for Imipenem, Linezolid, and Tobramycin Indication: Acinetobacter PNA / MRSA bacteremia  Allergies  Allergen Reactions  . Cefuroxime Axetil Anaphylaxis  . Ertapenem Other (See Comments)    Rash and confusion  . Morphine And Related Other (See Comments)    Changed mental status, confusion, headache, visual hallucination  . Penicillins Anaphylaxis and Other (See Comments)    ?can take amoxicillin?  . Sulfa Antibiotics Anaphylaxis, Shortness Of Breath and Other (See Comments)  . Tessalon [Benzonatate] Anaphylaxis  . Shellfish Allergy Itching and Other (See Comments)    Took benadryl to alleviate reaction  . Miripirium Rash and Other (See Comments)    Change in mental status    Patient Measurements: Height: 5\' 8"  (172.7 cm) Weight: 113 lb (51.256 kg) IBW/kg (Calculated) : 68.4  Vital Signs: Temp: 98 F (36.7 C) (08/22 1210) Temp Source: Oral (08/22 1210) BP: 110/71 mmHg (08/22 1210) Pulse Rate: 97 (08/22 1210) Intake/Output from previous day: 08/21 0701 - 08/22 0700 In: 2203 [I.V.:3; NG/GT:1400; IV Piggyback:800] Out: 3225 [Urine:2875; Stool:350] Intake/Output from this shift: Total I/O In: 420 [NG/GT:420] Out: 600 [Urine:600]  Labs:  Recent Labs  05/30/15 0521  WBC 12.5*  HGB 8.9*  PLT 699*  CREATININE 0.46*   Estimated Creatinine Clearance: 98 mL/min (by C-G formula based on Cr of 0.46). No results for input(s): VANCOTROUGH, VANCOPEAK, VANCORANDOM, GENTTROUGH, GENTPEAK, GENTRANDOM, TOBRATROUGH, TOBRAPEAK, TOBRARND, AMIKACINPEAK, AMIKACINTROU, AMIKACIN in the last 72 hours.   Microbiology: Recent Results (from the past 720 hour(s))  Culture, blood (routine x 2)     Status: None   Collection Time: 05/04/15  6:30 PM  Result Value Ref Range Status   Specimen Description BLOOD PICC LINE  Final   Special Requests BOTTLES DRAWN AEROBIC AND ANAEROBIC 5ML  Final   Culture   Final    NO GROWTH 5 DAYS Performed  at [facility]    Report Status 05/09/2015 FINAL  Final  Culture, blood (routine x 2)     Status: None   Collection Time: 05/04/15  6:30 PM  Result Value Ref Range Status   Specimen Description BLOOD PICC LINE  Final   Special Requests BOTTLES DRAWN AEROBIC AND ANAEROBIC 5ML  Final   Culture   Final    NO GROWTH 5 DAYS Performed at [facility]    Report Status 05/09/2015 FINAL  Final  Anaerobic culture     Status: None   Collection Time: 05/05/15  1:01 PM  Result Value Ref Range Status   Specimen Description ABSCESS  Final   Special Requests SPEC A ON SWAB RIGHT PELVIC ABSCESS  Final   Gram Stain   Final    FEW WBC PRESENT, PREDOMINANTLY MONONUCLEAR NO SQUAMOUS EPITHELIAL CELLS SEEN FEW GRAM POSITIVE COCCI IN PAIRS IN CLUSTERS Performed at [facility]    Culture   Final    NO ANAEROBES ISOLATED Performed at [facility]    Report Status 05/10/2015 FINAL  Final  Culture, routine-abscess     Status: None   Collection Time: 05/05/15  1:01 PM  Result Value Ref Range Status   Specimen Description ABSCESS  Final   Special Requests SPEC A ON SWAB RIGHT PELVIC ABSCESS  Final   Gram Stain   Final    FEW WBC PRESENT, PREDOMINANTLY MONONUCLEAR NO SQUAMOUS EPITHELIAL CELLS SEEN FEW GRAM POSITIVE COCCI IN PAIRS IN CLUSTERS Performed at [facility]    Culture   Final  MODERATE METHICILLIN RESISTANT STAPHYLOCOCCUS AUREUS Note: RIFAMPIN AND GENTAMICIN SHOULD NOT BE USED AS SINGLE DRUGS FOR TREATMENT OF STAPH INFECTIONS. CRITICAL RESULT CALLED TO, READ BACK BY AND VERIFIED WITH: IREKIA B. 8/1 @216  BY REAMM Performed at [facility]    Report Status 05/09/2015 FINAL  Final   Organism ID, Bacteria METHICILLIN RESISTANT STAPHYLOCOCCUS AUREUS  Final      Susceptibility   Methicillin resistant staphylococcus aureus - MIC*    CLINDAMYCIN >=8 RESISTANT Resistant     ERYTHROMYCIN >=8 RESISTANT Resistant     GENTAMICIN <=0.5 SENSITIVE  Sensitive     LEVOFLOXACIN >=8 RESISTANT Resistant     OXACILLIN >=4 RESISTANT Resistant     RIFAMPIN <=0.5 SENSITIVE Sensitive     TRIMETH/SULFA >=320 RESISTANT Resistant     VANCOMYCIN 1 SENSITIVE Sensitive     TETRACYCLINE <=1 SENSITIVE Sensitive     * MODERATE METHICILLIN RESISTANT STAPHYLOCOCCUS AUREUS  Tissue culture     Status: None   Collection Time: 05/05/15  1:13 PM  Result Value Ref Range Status   Specimen Description TISSUE  Final   Special Requests SPEC B IN CUP BONE RIGHT ISCHIUM  Final   Gram Stain   Final    RARE WBC PRESENT, PREDOMINANTLY MONONUCLEAR NO ORGANISMS SEEN Performed at [facility]    Culture   Final    FEW METHICILLIN RESISTANT STAPHYLOCOCCUS AUREUS Note: RIFAMPIN AND GENTAMICIN SHOULD NOT BE USED AS SINGLE DRUGS FOR TREATMENT OF STAPH INFECTIONS. CRITICAL RESULT CALLED TO, READ BACK BY AND VERIFIED WITH: IREKIA B. 8/1 @216  BY REAMM Performed at [facility]    Report Status 05/09/2015 FINAL  Final   Organism ID, Bacteria METHICILLIN RESISTANT STAPHYLOCOCCUS AUREUS  Final      Susceptibility   Methicillin resistant staphylococcus aureus - MIC*    CLINDAMYCIN >=8 RESISTANT Resistant     ERYTHROMYCIN >=8 RESISTANT Resistant     GENTAMICIN <=0.5 SENSITIVE Sensitive     LEVOFLOXACIN >=8 RESISTANT Resistant     OXACILLIN >=4 RESISTANT Resistant     RIFAMPIN <=0.5 SENSITIVE Sensitive     TRIMETH/SULFA >=320 RESISTANT Resistant     VANCOMYCIN 1 SENSITIVE Sensitive     TETRACYCLINE <=1 SENSITIVE Sensitive     * FEW METHICILLIN RESISTANT STAPHYLOCOCCUS AUREUS  Culture, respiratory (NON-Expectorated)     Status: None   Collection Time: 05/08/15  8:36 AM  Result Value Ref Range Status   Specimen Description TRACHEAL ASPIRATE  Final   Special Requests Normal  Final   Gram Stain   Final    RARE WBC PRESENT, PREDOMINANTLY PMN NO SQUAMOUS EPITHELIAL CELLS SEEN FEW GRAM NEGATIVE RODS RARE GRAM POSITIVE COCCI IN PAIRS RARE YEAST    Culture    Final    ABUNDANT ACINETOBACTER CALCOACETICUS/BAUMANNII COMPLEX Note: TIGECYCLINE  MIC 1 COLISTIN SENSITIVE .0125 ug/mL  ETEST results for this drug are "FOR INVESTIGATIONAL USE ONLY" and should NOT be used for clinical purposes. Performed at [facility]    Report Status 05/12/2015 FINAL  Final   Organism ID, Bacteria ACINETOBACTER CALCOACETICUS/BAUMANNII COMPLEX  Final      Susceptibility   Acinetobacter calcoaceticus/baumannii complex - MIC*    AMPICILLIN >=32 RESISTANT Resistant     AMPICILLIN/SULBACTAM 4 SENSITIVE Sensitive     CEFAZOLIN >=64 RESISTANT Resistant     CEFTAZIDIME >=64 RESISTANT Resistant     CEFTRIAXONE >=64 RESISTANT Resistant     CIPROFLOXACIN >=4 RESISTANT Resistant     GENTAMICIN <=1 SENSITIVE Sensitive  IMIPENEM 0.5 SENSITIVE Sensitive     PIP/TAZO >=128 RESISTANT Resistant     TOBRAMYCIN <=1 SENSITIVE Sensitive     TRIMETH/SULFA >=320 RESISTANT Resistant     * ABUNDANT ACINETOBACTER CALCOACETICUS/BAUMANNII COMPLEX  Culture, blood (routine x 2)     Status: None   Collection Time: 05/10/15  3:05 PM  Result Value Ref Range Status   Specimen Description Blood  Final   Special Requests Normal  Final   Culture  Setup Time TEST WILL BE CREDITED TEST CANCELLED PER RN   Final   Culture TEST WILL BE CREDITED TEST CANCELLED PER RN   Final   Report Status 05/10/2015 FINAL  Final  Culture, respiratory (NON-Expectorated)     Status: None   Collection Time: 05/12/15 12:20 AM  Result Value Ref Range Status   Specimen Description BRONCHIAL ALVEOLAR LAVAGE  Final   Special Requests NONE  Final   Gram Stain   Final    MODERATE WBC PRESENT,BOTH PMN AND MONONUCLEAR RARE SQUAMOUS EPITHELIAL CELLS PRESENT NO ORGANISMS SEEN Performed at [facility]    Culture   Final    FEW CANDIDA ALBICANS FEW ACINETOBACTER CALCOACETICUS/BAUMANNII COMPLEX Performed at [facility]    Report Status 05/15/2015 FINAL  Final   Organism ID, Bacteria  ACINETOBACTER CALCOACETICUS/BAUMANNII COMPLEX  Final      Susceptibility   Acinetobacter calcoaceticus/baumannii complex - MIC*    AMPICILLIN >=32 RESISTANT Resistant     AMPICILLIN/SULBACTAM 4 SENSITIVE Sensitive     CEFAZOLIN >=64 RESISTANT Resistant     CEFTAZIDIME >=64 RESISTANT Resistant     CEFTRIAXONE >=64 RESISTANT Resistant     CIPROFLOXACIN >=4 RESISTANT Resistant     GENTAMICIN <=1 SENSITIVE Sensitive     IMIPENEM 0.5 SENSITIVE Sensitive     PIP/TAZO >=128 RESISTANT Resistant     TOBRAMYCIN <=1 SENSITIVE Sensitive     TRIMETH/SULFA >=320 RESISTANT Resistant     * FEW ACINETOBACTER CALCOACETICUS/BAUMANNII COMPLEX  Culture, blood (routine x 2)     Status: None   Collection Time: 05/15/15  5:30 PM  Result Value Ref Range Status   Specimen Description BLOOD BLOOD LEFT HAND  Final   Special Requests BOTTLES DRAWN AEROBIC ONLY 1CC  Final   Culture NO GROWTH 5 DAYS  Final   Report Status 05/20/2015 FINAL  Final  Culture, blood (routine x 2)     Status: None   Collection Time: 05/15/15  6:22 PM  Result Value Ref Range Status   Specimen Description BLOOD RIGHT FOOT  Final   Special Requests IN PEDIATRIC BOTTLE 3CC  Final   Culture NO GROWTH 5 DAYS  Final   Report Status 05/20/2015 FINAL  Final  Culture, respiratory (NON-Expectorated)     Status: None   Collection Time: 05/18/15  7:46 PM  Result Value Ref Range Status   Specimen Description TRACHEAL ASPIRATE  Final   Special Requests NONE  Final   Gram Stain   Final    ABUNDANT WBC PRESENT,BOTH PMN AND MONONUCLEAR RARE SQUAMOUS EPITHELIAL CELLS PRESENT MODERATE GRAM NEGATIVE COCCOBACILLI RARE GRAM POSITIVE COCCI IN PAIRS    Culture   Final    ABUNDANT ACINETOBACTER CALCOACETICUS/BAUMANNII COMPLEX Performed at [facility]    Report Status 05/21/2015 FINAL  Final   Organism ID, Bacteria ACINETOBACTER CALCOACETICUS/BAUMANNII COMPLEX  Final      Susceptibility   Acinetobacter calcoaceticus/baumannii complex -  MIC*    AMPICILLIN >=32 RESISTANT Resistant     AMPICILLIN/SULBACTAM 4 SENSITIVE Sensitive  CEFAZOLIN >=64 RESISTANT Resistant     CEFTAZIDIME >=64 RESISTANT Resistant     CEFTRIAXONE >=64 RESISTANT Resistant     CIPROFLOXACIN >=4 RESISTANT Resistant     GENTAMICIN 4 SENSITIVE Sensitive     IMIPENEM 0.5 SENSITIVE Sensitive     PIP/TAZO >=128 RESISTANT Resistant     TOBRAMYCIN <=1 SENSITIVE Sensitive     TRIMETH/SULFA >=320 RESISTANT Resistant     * ABUNDANT ACINETOBACTER CALCOACETICUS/BAUMANNII COMPLEX    Anti-infectives    Start     Dose/Rate Route Frequency Ordered Stop   05/26/15 1431  ceFAZolin (ANCEF) 1-5 GM-% IVPB    Comments:  Hines, Kristopher   : cabinet override      05/26/15 1431 05/27/15 0244   05/25/15 0300  tobramycin (NEBCIN) 160 mg in dextrose 5 % 50 mL IVPB     160 mg 108 mL/hr over 30 Minutes Intravenous Every 36 hours 05/24/15 1438 06/04/15 1459   05/24/15 1500  gentamicin (GARAMYCIN) 120 mg in dextrose 5 % 50 mL IVPB  Status:  Discontinued     120 mg 106 mL/hr over 30 Minutes Intravenous Every 24 hours 05/23/15 1804 05/24/15 1136   05/23/15 2000  imipenem-cilastatin (PRIMAXIN) 500 mg in sodium chloride 0.9 % 100 mL IVPB     500 mg 200 mL/hr over 30 Minutes Intravenous Every 8 hours 05/23/15 1804 06/03/15 0359   05/20/15 1300  linezolid (ZYVOX) IVPB 600 mg     600 mg 300 mL/hr over 60 Minutes Intravenous Every 12 hours 05/20/15 1218     05/20/15 1300  imipenem-cilastatin (PRIMAXIN) 500 mg in sodium chloride 0.9 % 100 mL IVPB  Status:  Discontinued     500 mg 200 mL/hr over 30 Minutes Intravenous 4 times per day 05/20/15 1218 05/23/15 1804   05/20/15 1300  gentamicin (GARAMYCIN) 190 mg in dextrose 5 % 50 mL IVPB  Status:  Discontinued     2.5 mg/kg  75.8 kg 109.5 mL/hr over 30 Minutes Intravenous Every 24 hours 05/20/15 1218 05/23/15 1804   05/18/15 0030  vancomycin (VANCOCIN) IVPB 1000 mg/200 mL premix  Status:  Discontinued     1,000 mg 200 mL/hr over  60 Minutes Intravenous Every 24 hours 05/18/15 0017 05/20/15 1218   05/14/15 0029  vancomycin (VANCOCIN) IVPB 750 mg/150 ml premix  Status:  Discontinued     750 mg 150 mL/hr over 60 Minutes Intravenous Every 24 hours 05/14/15 0029 05/18/15 0017   05/13/15 1000  gentamicin (GARAMYCIN) 520 mg in dextrose 5 % 100 mL IVPB  Status:  Discontinued     7 mg/kg  74.5 kg 113 mL/hr over 60 Minutes Intravenous Every 48 hours 05/12/15 0044 05/17/15 1403   05/12/15 0000  tigecycline (TYGACIL) 50 mg in sodium chloride 0.9 % 100 mL IVPB  Status:  Discontinued     50 mg 200 mL/hr over 30 Minutes Intravenous Every 12 hours 05/11/15 0932 05/20/15 1218   05/11/15 1200  tigecycline (TYGACIL) 100 mg in sodium chloride 0.9 % 100 mL IVPB     100 mg 200 mL/hr over 30 Minutes Intravenous  Once 05/11/15 0932 05/11/15 1443   05/11/15 1015  gentamicin (GARAMYCIN) 520 mg in dextrose 5 % 100 mL IVPB  Status:  Discontinued     7 mg/kg  74.5 kg 113 mL/hr over 60 Minutes Intravenous Every 24 hours 05/11/15 1000 05/12/15 0043   05/09/15 0930  ciprofloxacin (CIPRO) IVPB 400 mg  Status:  Discontinued     400 mg 200 mL/hr over  60 Minutes Intravenous Every 12 hours 05/09/15 0904 05/11/15 0823   05/08/15 0900  aztreonam (AZACTAM) 2 g in dextrose 5 % 50 mL IVPB  Status:  Discontinued     2 g 100 mL/hr over 30 Minutes Intravenous 3 times per day 05/08/15 0826 05/11/15 0823   05/06/15 0000  vancomycin (VANCOCIN) IVPB 750 mg/150 ml premix  Status:  Discontinued     750 mg 150 mL/hr over 60 Minutes Intravenous Every 48 hours 05/05/15 2357 05/14/15 0030   04/28/15 2000  vancomycin (VANCOCIN) IVPB 750 mg/150 ml premix  Status:  Discontinued     750 mg 150 mL/hr over 60 Minutes Intravenous Every 24 hours 04/28/15 0731 05/04/15 1932   04/26/15 0800  vancomycin (VANCOCIN) IVPB 750 mg/150 ml premix  Status:  Discontinued     750 mg 150 mL/hr over 60 Minutes Intravenous Every 12 hours 04/25/15 1715 04/28/15 0731   04/26/15 0200   aztreonam (AZACTAM) 1 g in dextrose 5 % 50 mL IVPB  Status:  Discontinued     1 g 100 mL/hr over 30 Minutes Intravenous Every 8 hours 04/25/15 1919 05/07/15 1035   04/25/15 1715  vancomycin (VANCOCIN) IVPB 750 mg/150 ml premix     750 mg 150 mL/hr over 60 Minutes Intravenous STAT 04/25/15 1715 04/25/15 1906   04/25/15 1645  aztreonam (AZACTAM) 2 g in dextrose 5 % 50 mL IVPB     2 g 100 mL/hr over 30 Minutes Intravenous  Once 04/25/15 1643 04/25/15 1807      Assessment: 29 yo m with complicated infectious diseases course. Patient is wheelchair bound with multiple medical problems.  Day # 11/14 of abx for VAP. Continuing imipenem and tobramycin for 14 days per ID.   Day # 35/63 of abx for MRSA/microaerophilic strep bacteremia. Currently on Linezolid but to switch back to vancomycin on 8/26 and continue treatment until 9/18.  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     Pt is currently afebrile and WBC down to 12.5. TEE on 7/28 was negative for vegetations  Goal of Therapy:  Tobramycin trough level < 2 mcg/mL Tobramycin peak level 6-8 mcg/mL Resolution of infection  Plan:  Continue tobramycin 160 mg IV q36h Change imipenem to 250mg  IV q6h Continue linezolid 600 mg IV q12h Check tobramycin trough at 0230 tomorrow Monitor clinical picture, renal function, CBC, Tobra levels at Css  Jason [name] J 05/30/2015,12:42 PM

## 2015-05-31 LAB — BASIC METABOLIC PANEL
ANION GAP: 8 (ref 5–15)
BUN: 17 mg/dL (ref 6–20)
CHLORIDE: 107 mmol/L (ref 101–111)
CO2: 20 mmol/L — ABNORMAL LOW (ref 22–32)
Calcium: 8.7 mg/dL — ABNORMAL LOW (ref 8.9–10.3)
Creatinine, Ser: 0.48 mg/dL — ABNORMAL LOW (ref 0.61–1.24)
GFR calc Af Amer: >60 mL/min (ref >60)
GLUCOSE: 245 mg/dL — AB (ref 65–99)
POTASSIUM: 5.6 mmol/L — AB (ref 3.5–5.1)
Sodium: 135 mmol/L (ref 135–145)

## 2015-05-31 LAB — GLUCOSE, CAPILLARY
GLUCOSE-CAPILLARY: 249 mg/dL — AB (ref 65–99)
GLUCOSE-CAPILLARY: 272 mg/dL — AB (ref 65–99)
Glucose-Capillary: 217 mg/dL — ABNORMAL HIGH (ref 65–99)
Glucose-Capillary: 249 mg/dL — ABNORMAL HIGH (ref 65–99)

## 2015-05-31 LAB — URINALYSIS, ROUTINE W REFLEX MICROSCOPIC
Bilirubin Urine: NEGATIVE
GLUCOSE, UA: NEGATIVE mg/dL
HGB URINE DIPSTICK: NEGATIVE
Ketones, ur: NEGATIVE mg/dL
Nitrite: NEGATIVE
PH: 7.5 (ref 5.0–8.0)
Protein, ur: NEGATIVE mg/dL
SPECIFIC GRAVITY, URINE: 1.01 (ref 1.005–1.030)
Urobilinogen, UA: 0.2 mg/dL (ref 0.0–1.0)

## 2015-05-31 LAB — URINE MICROSCOPIC-ADD ON

## 2015-05-31 LAB — MAGNESIUM: Magnesium: 1.7 mg/dL (ref 1.7–2.4)

## 2015-05-31 LAB — TOBRAMYCIN LEVEL, TROUGH: Tobramycin Tr: 1.4 ug/mL (ref 0.5–2.0)

## 2015-05-31 MED ORDER — FENTANYL CITRATE (PF) 100 MCG/2ML IJ SOLN
12.5000 ug | INTRAMUSCULAR | Status: DC | PRN
  Administered 2015-05-31 – 2015-06-01 (×5): 12.5 ug via INTRAVENOUS
  Filled 2015-05-31 (×6): qty 2

## 2015-05-31 MED ORDER — FENTANYL 100 MCG/HR TD PT72: 100.0000 ug | MEDICATED_PATCH | TRANSDERMAL | Status: DC

## 2015-05-31 MED ORDER — MAGNESIUM SULFATE 2 GM/50ML IV SOLN
2.0000 g | Freq: Once | INTRAVENOUS | Status: AC
  Administered 2015-05-31: 2 g via INTRAVENOUS
  Filled 2015-05-31: qty 50

## 2015-05-31 MED ORDER — SODIUM POLYSTYRENE SULFONATE 15 GM/60ML PO SUSP
45.0000 g | Freq: Once | ORAL | Status: DC
  Filled 2015-05-31: qty 180

## 2015-05-31 MED ORDER — SODIUM POLYSTYRENE SULFONATE 15 GM/60ML PO SUSP
45.0000 g | Freq: Once | ORAL | Status: DC
  Administered 2015-05-31: 45 g via ORAL
  Filled 2015-05-31: qty 180

## 2015-05-31 NOTE — Progress Notes (Signed)
ANTIBIOTIC CONSULT NOTE - FOLLOW UP  Pharmacy Consult for Tobramycin  Indication: Acinetobacter PNA  Allergies  Allergen Reactions  . Cefuroxime Axetil Anaphylaxis  . Ertapenem Other (See Comments)    Rash and confusion  . Morphine And Related Other (See Comments)    Changed mental status, confusion, headache, visual hallucination  . Penicillins Anaphylaxis and Other (See Comments)    ?can take amoxicillin?  . Sulfa Antibiotics Anaphylaxis, Shortness Of Breath and Other (See Comments)  . Tessalon [Benzonatate] Anaphylaxis  . Shellfish Allergy Itching and Other (See Comments)    Took benadryl to alleviate reaction  . Miripirium Rash and Other (See Comments)    Change in mental status    Patient Measurements: Height: 5\' 8"  (172.7 cm) Weight: 118 lb (53.524 kg) IBW/kg (Calculated) : 68.4  Vital Signs: Temp: 98.7 F (37.1 C) (08/23 0400) Temp Source: Oral (08/23 0400) BP: 147/100 mmHg (08/23 0400) Pulse Rate: 88 (08/23 0403) Intake/Output from previous day: 08/22 0701 - 08/23 0700 In: 2471 [I.V.:3; MV/EH:2094; IV Piggyback:900] Out: 1600 [Urine:1100; Stool:500] Intake/Output from this shift: Total I/O In: 2051 [I.V.:3; NG/GT:1148; IV Piggyback:900] Out: 1000 [Urine:500; Stool:500]  Labs:  Recent Labs  05/30/15 0521 05/31/15 0316  WBC 12.5*  --   HGB 8.9*  --   PLT 699*  --   CREATININE 0.46* 0.48*   Estimated Creatinine Clearance: 102.2 mL/min (by C-G formula based on Cr of 0.48).  Recent Labs  05/31/15 0316  [place] 1.4     Microbiology: Recent Results (from the past 720 hour(s))  Culture, blood (routine x 2)     Status: None   Collection Time: 05/04/15  6:30 PM  Result Value Ref Range Status   Specimen Description BLOOD PICC LINE  Final   Special Requests BOTTLES DRAWN AEROBIC AND ANAEROBIC 5ML  Final   Culture   Final    NO GROWTH 5 DAYS Performed at [facility]    Report Status 05/09/2015 FINAL  Final  Culture, blood (routine x 2)      Status: None   Collection Time: 05/04/15  6:30 PM  Result Value Ref Range Status   Specimen Description BLOOD PICC LINE  Final   Special Requests BOTTLES DRAWN AEROBIC AND ANAEROBIC 5ML  Final   Culture   Final    NO GROWTH 5 DAYS Performed at [facility]    Report Status 05/09/2015 FINAL  Final  Anaerobic culture     Status: None   Collection Time: 05/05/15  1:01 PM  Result Value Ref Range Status   Specimen Description ABSCESS  Final   Special Requests SPEC A ON SWAB RIGHT PELVIC ABSCESS  Final   Gram Stain   Final    FEW WBC PRESENT, PREDOMINANTLY MONONUCLEAR NO SQUAMOUS EPITHELIAL CELLS SEEN FEW GRAM POSITIVE COCCI IN PAIRS IN CLUSTERS Performed at [facility]    Culture   Final    NO ANAEROBES ISOLATED Performed at [facility]    Report Status 05/10/2015 FINAL  Final  Culture, routine-abscess     Status: None   Collection Time: 05/05/15  1:01 PM  Result Value Ref Range Status   Specimen Description ABSCESS  Final   Special Requests SPEC A ON SWAB RIGHT PELVIC ABSCESS  Final   Gram Stain   Final    FEW WBC PRESENT, PREDOMINANTLY MONONUCLEAR NO SQUAMOUS EPITHELIAL CELLS SEEN FEW GRAM POSITIVE COCCI IN PAIRS IN CLUSTERS Performed at [facility]    Culture   Final  MODERATE METHICILLIN RESISTANT STAPHYLOCOCCUS AUREUS Note: RIFAMPIN AND GENTAMICIN SHOULD NOT BE USED AS SINGLE DRUGS FOR TREATMENT OF STAPH INFECTIONS. CRITICAL RESULT CALLED TO, READ BACK BY AND VERIFIED WITH: IREKIA B. 8/1 @216  BY REAMM Performed at [facility]    Report Status 05/09/2015 FINAL  Final   Organism ID, Bacteria METHICILLIN RESISTANT STAPHYLOCOCCUS AUREUS  Final      Susceptibility   Methicillin resistant staphylococcus aureus - MIC*    CLINDAMYCIN >=8 RESISTANT Resistant     ERYTHROMYCIN >=8 RESISTANT Resistant     GENTAMICIN <=0.5 SENSITIVE Sensitive     LEVOFLOXACIN >=8 RESISTANT Resistant     OXACILLIN >=4 RESISTANT Resistant      RIFAMPIN <=0.5 SENSITIVE Sensitive     TRIMETH/SULFA >=320 RESISTANT Resistant     VANCOMYCIN 1 SENSITIVE Sensitive     TETRACYCLINE <=1 SENSITIVE Sensitive     * MODERATE METHICILLIN RESISTANT STAPHYLOCOCCUS AUREUS  Tissue culture     Status: None   Collection Time: 05/05/15  1:13 PM  Result Value Ref Range Status   Specimen Description TISSUE  Final   Special Requests SPEC B IN CUP BONE RIGHT ISCHIUM  Final   Gram Stain   Final    RARE WBC PRESENT, PREDOMINANTLY MONONUCLEAR NO ORGANISMS SEEN Performed at [facility]    Culture   Final    FEW METHICILLIN RESISTANT STAPHYLOCOCCUS AUREUS Note: RIFAMPIN AND GENTAMICIN SHOULD NOT BE USED AS SINGLE DRUGS FOR TREATMENT OF STAPH INFECTIONS. CRITICAL RESULT CALLED TO, READ BACK BY AND VERIFIED WITH: IREKIA B. 8/1 @216  BY REAMM Performed at [facility]    Report Status 05/09/2015 FINAL  Final   Organism ID, Bacteria METHICILLIN RESISTANT STAPHYLOCOCCUS AUREUS  Final      Susceptibility   Methicillin resistant staphylococcus aureus - MIC*    CLINDAMYCIN >=8 RESISTANT Resistant     ERYTHROMYCIN >=8 RESISTANT Resistant     GENTAMICIN <=0.5 SENSITIVE Sensitive     LEVOFLOXACIN >=8 RESISTANT Resistant     OXACILLIN >=4 RESISTANT Resistant     RIFAMPIN <=0.5 SENSITIVE Sensitive     TRIMETH/SULFA >=320 RESISTANT Resistant     VANCOMYCIN 1 SENSITIVE Sensitive     TETRACYCLINE <=1 SENSITIVE Sensitive     * FEW METHICILLIN RESISTANT STAPHYLOCOCCUS AUREUS  Culture, respiratory (NON-Expectorated)     Status: None   Collection Time: 05/08/15  8:36 AM  Result Value Ref Range Status   Specimen Description TRACHEAL ASPIRATE  Final   Special Requests Normal  Final   Gram Stain   Final    RARE WBC PRESENT, PREDOMINANTLY PMN NO SQUAMOUS EPITHELIAL CELLS SEEN FEW GRAM NEGATIVE RODS RARE GRAM POSITIVE COCCI IN PAIRS RARE YEAST    Culture   Final    ABUNDANT ACINETOBACTER CALCOACETICUS/BAUMANNII COMPLEX Note: TIGECYCLINE  MIC 1  COLISTIN SENSITIVE .0125 ug/mL  ETEST results for this drug are "FOR INVESTIGATIONAL USE ONLY" and should NOT be used for clinical purposes. Performed at [facility]    Report Status 05/12/2015 FINAL  Final   Organism ID, Bacteria ACINETOBACTER CALCOACETICUS/BAUMANNII COMPLEX  Final      Susceptibility   Acinetobacter calcoaceticus/baumannii complex - MIC*    AMPICILLIN >=32 RESISTANT Resistant     AMPICILLIN/SULBACTAM 4 SENSITIVE Sensitive     CEFAZOLIN >=64 RESISTANT Resistant     CEFTAZIDIME >=64 RESISTANT Resistant     CEFTRIAXONE >=64 RESISTANT Resistant     CIPROFLOXACIN >=4 RESISTANT Resistant     GENTAMICIN <=1 SENSITIVE Sensitive  IMIPENEM 0.5 SENSITIVE Sensitive     PIP/TAZO >=128 RESISTANT Resistant     TOBRAMYCIN <=1 SENSITIVE Sensitive     TRIMETH/SULFA >=320 RESISTANT Resistant     * ABUNDANT ACINETOBACTER CALCOACETICUS/BAUMANNII COMPLEX  Culture, blood (routine x 2)     Status: None   Collection Time: 05/10/15  3:05 PM  Result Value Ref Range Status   Specimen Description Blood  Final   Special Requests Normal  Final   Culture  Setup Time TEST WILL BE CREDITED TEST CANCELLED PER RN   Final   Culture TEST WILL BE CREDITED TEST CANCELLED PER RN   Final   Report Status 05/10/2015 FINAL  Final  Culture, respiratory (NON-Expectorated)     Status: None   Collection Time: 05/12/15 12:20 AM  Result Value Ref Range Status   Specimen Description BRONCHIAL ALVEOLAR LAVAGE  Final   Special Requests NONE  Final   Gram Stain   Final    MODERATE WBC PRESENT,BOTH PMN AND MONONUCLEAR RARE SQUAMOUS EPITHELIAL CELLS PRESENT NO ORGANISMS SEEN Performed at [facility]    Culture   Final    FEW CANDIDA ALBICANS FEW ACINETOBACTER CALCOACETICUS/BAUMANNII COMPLEX Performed at [facility]    Report Status 05/15/2015 FINAL  Final   Organism ID, Bacteria ACINETOBACTER CALCOACETICUS/BAUMANNII COMPLEX  Final      Susceptibility   Acinetobacter  calcoaceticus/baumannii complex - MIC*    AMPICILLIN >=32 RESISTANT Resistant     AMPICILLIN/SULBACTAM 4 SENSITIVE Sensitive     CEFAZOLIN >=64 RESISTANT Resistant     CEFTAZIDIME >=64 RESISTANT Resistant     CEFTRIAXONE >=64 RESISTANT Resistant     CIPROFLOXACIN >=4 RESISTANT Resistant     GENTAMICIN <=1 SENSITIVE Sensitive     IMIPENEM 0.5 SENSITIVE Sensitive     PIP/TAZO >=128 RESISTANT Resistant     TOBRAMYCIN <=1 SENSITIVE Sensitive     TRIMETH/SULFA >=320 RESISTANT Resistant     * FEW ACINETOBACTER CALCOACETICUS/BAUMANNII COMPLEX  Culture, blood (routine x 2)     Status: None   Collection Time: 05/15/15  5:30 PM  Result Value Ref Range Status   Specimen Description BLOOD BLOOD LEFT HAND  Final   Special Requests BOTTLES DRAWN AEROBIC ONLY 1CC  Final   Culture NO GROWTH 5 DAYS  Final   Report Status 05/20/2015 FINAL  Final  Culture, blood (routine x 2)     Status: None   Collection Time: 05/15/15  6:22 PM  Result Value Ref Range Status   Specimen Description BLOOD RIGHT FOOT  Final   Special Requests IN PEDIATRIC BOTTLE 3CC  Final   Culture NO GROWTH 5 DAYS  Final   Report Status 05/20/2015 FINAL  Final  Culture, respiratory (NON-Expectorated)     Status: None   Collection Time: 05/18/15  7:46 PM  Result Value Ref Range Status   Specimen Description TRACHEAL ASPIRATE  Final   Special Requests NONE  Final   Gram Stain   Final    ABUNDANT WBC PRESENT,BOTH PMN AND MONONUCLEAR RARE SQUAMOUS EPITHELIAL CELLS PRESENT MODERATE GRAM NEGATIVE COCCOBACILLI RARE GRAM POSITIVE COCCI IN PAIRS    Culture   Final    ABUNDANT ACINETOBACTER CALCOACETICUS/BAUMANNII COMPLEX Performed at [facility]    Report Status 05/21/2015 FINAL  Final   Organism ID, Bacteria ACINETOBACTER CALCOACETICUS/BAUMANNII COMPLEX  Final      Susceptibility   Acinetobacter calcoaceticus/baumannii complex - MIC*    AMPICILLIN >=32 RESISTANT Resistant     AMPICILLIN/SULBACTAM 4 SENSITIVE Sensitive  CEFAZOLIN >=64 RESISTANT Resistant     CEFTAZIDIME >=64 RESISTANT Resistant     CEFTRIAXONE >=64 RESISTANT Resistant     CIPROFLOXACIN >=4 RESISTANT Resistant     GENTAMICIN 4 SENSITIVE Sensitive     IMIPENEM 0.5 SENSITIVE Sensitive     PIP/TAZO >=128 RESISTANT Resistant     TOBRAMYCIN <=1 SENSITIVE Sensitive     TRIMETH/SULFA >=320 RESISTANT Resistant     * ABUNDANT ACINETOBACTER CALCOACETICUS/BAUMANNII COMPLEX    Assessment: Therapeutic tobramycin trough level   Goal of Therapy:  Tobramycin trough <2 mg/L, peak 6-8 mg/L  Plan:  -Continue Tobramycin 160 mg IV q36h -Re-check levels as needed  Narda Bonds 05/31/2015,4:22 AM

## 2015-05-31 NOTE — Progress Notes (Signed)
[name] placed Jason [name] on his new home vent successfully. Instructed her on how to troubleshoot when patient's minute ventilation alarm is going off. She made sure to inflate the cuff and ensure the ventilator was turned on. RT will continue to monitor.

## 2015-05-31 NOTE — Progress Notes (Signed)
Patient's s/p cath was leaking around the foley tube and saturating patient's wounds and linen. Foley tubing checked for kinks and sediment. Patient was bladder scanned for 600cc in bladder. Patient and mom says that "this just happens sometimes". Called on call Dr. Emmit Alexanders who suggest to have urology follow up in the morning.

## 2015-05-31 NOTE — Progress Notes (Signed)
PULMONARY / CRITICAL CARE MEDICINE   Name: Jason [name] MRN: [id number] DOB: September 02, 1986    ADMISSION DATE: 04/25/2015 TODAY'S DATE: 05/10/2015  REFERRING MD : TRH  CHIEF COMPLAINT: Hypotension  INITIAL PRESENTATION: 29 year old male quad with MRSA bacteremia from multiple sources. Concern for endocarditis. Wounds have been checked and followed by general surgery and ortho. Patient has history of hypotension and concern for septic shock and levophed was started and PCCM consulted. 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 ?  HCAP.   SIGNIFICANT EVENTS: 7/28 >> OR for debridement of osteomyelitis and abscess cx with staph. Positive MRSA and strep blood cx 7/28 >> Reconsulted for extensive post op bleeding and hypotension 7/31 >> worsening respiratory status, acidosis, hypoxemia, hypotension overnight 8/18  IR gastrostomy tube, mod sedation  STUDIES: 7/28 TEE >> NO vegetation, normal LV function  7/31 Bronch >> HCAP, extensive mucous plug 8/2 bronch - no sig obstruction 8/2- family does not want trach at this point even though is recommended 8/3 bronch lungs open, clear, extubated, re intubated lat evening due to collapse 8/4- pt changed decision, trach placed 8/8 weaned 10+ hours 05/22/15: nad on vent/ no new concerns per nursing/ ID note reviewed and strongly rec palliative care eval  8/18 home health DMEs evaluated home (OK for home vent). Got PEG tube done in IR. Holding chest PT d/t PEG and discomfort.  8/19 Tubefeeds resumed.  8/22 failed swallow eval 8/23 Mother completed overnight vent training. suprapub intermittently leaking. Kayexalate given for hyperkalemia. Fentanyl patch increased to 144mcg/hr as requiring freq PRN doses   SUBJECTIVE/OVERNIGHT/INTERVAL HX No distress.   VITAL SIGNS: Temp:  [98 F (36.7 C)-99.5 F (37.5 C)] 99.5 F (37.5 C) (08/23 0822) Pulse Rate:  [87-99] 90 (08/23 0900) Resp:  [0-21] 0 (08/23  0900) BP: (97-147)/(57-100) 142/98 mmHg (08/23 0900) SpO2:  [97 %-100 %] 100 % (08/23 0900) FiO2 (%):  [21 %-40 %] 21 % (08/23 0900) Weight:  [53.524 kg (118 lb)] 53.524 kg (118 lb) (08/23 0403) HEMODYNAMICS:   VENTILATOR SETTINGS: Vent Mode:  [-] AC FiO2 (%):  [21 %-40 %] 21 % Set Rate:  [16 bmp] 16 bmp Vt Set:  [570 mL] 570 mL PEEP:  [5 cmH20] 5 cmH20 INTAKE / OUTPUT:  Intake/Output Summary (Last 24 hours) at 05/31/15 1025 Last data filed at 05/31/15 0943  Gross per 24 hour  Intake   2054 ml  Output   2500 ml  Net   -446 ml    PHYSICAL EXAMINATION:  Gen: chronically ill appearing, NAD on ATC HENT: trach clean, dry, good phonation through PMV PULM: resps even non labored on ATC, diminished bases, occ rhonchi  CV: RRR, no mgr, trace edema arms, feet GI: BS+, soft, nontender. PEG unremarkable  Derm:  sacral wound stage 4 plus Neuro:  Awake, alert, nods approp   LABS:  CBC  Recent Labs Lab 05/25/15 0438 05/26/15 0425 05/30/15 0521  HGB 7.8* 8.2* 8.9*  HCT 24.0* 25.4* 27.5*  WBC 13.0* 14.5* 12.5*  PLT 520* 602* 699*   CHEMISTRY  Recent Labs Lab 05/25/15 0438  05/27/15 0223 05/28/15 0300 05/29/15 0313 05/30/15 0521 05/31/15 0316  NA 132*  --  136  --   --  133* 135  K 4.5  --  4.9  --   --  5.2* 5.6*  CL 105  --  107  --   --  105 107  CO2 22  --  19*  --   --  20* 20*  GLUCOSE 247*  --  172*  --   --  249* 245*  BUN 13  --  8  --   --  15 17  CREATININE 0.43*  --  0.53*  --   --  0.46* 0.48*  CALCIUM 7.3*  --  8.0*  --   --  8.2* 8.7*  MG 1.8  < > 2.2 1.7 1.6* 1.3* 1.7  PHOS 1.6*  --   --   --   --   --   --   < > = values in this interval not displayed. Estimated Creatinine Clearance: 102.2 mL/min (by C-G formula based on Cr of 0.48).   ENDOCRINE CBG (last 3)   Recent Labs  05/30/15 2037 05/31/15 0016 05/31/15 0406  GLUCAP 154* 249* 217*      IMAGING x48h  - image(s) personally visualized  -   highlighted in bold Dg Chest Port 1  View  05/30/2015   CLINICAL DATA:  Pneumonia and tracheostomy  EXAM: PORTABLE CHEST - 1 VIEW  COMPARISON:  05/21/2015  FINDINGS: Tracheostomy tube remains well seated. Nasogastric tube is been removed.  Normal heart size and stable upper mediastinal contours, including abnormal rounded appearance of the aortic knob where there was adenopathy on chest CT 02/20/2015.  Improved lung aeration, although there is still bilateral infrahilar airspace disease. Bilateral layering pleural effusion, with fissural extension on the right. No air leak.  IMPRESSION: 1. Improved pneumonia, currently seen in the bilateral infrahilar lung. 2. Bilateral layering pleural effusion.   Electronically Signed   By: Monte Fantasia M.D.   On: 05/30/2015 07:45   Dg Swallowing Func-speech Pathology  05/30/2015    Objective Swallowing Evaluation:   Modified Barium Swallow Patient Details  Name: Jason [name] MRN: [id number] Date of Birth: Jun 11, 1986  Today's Date: 05/30/2015 Time: SLP Start Time (ACUTE ONLY): 1035-SLP Stop Time (ACUTE ONLY): 1051 SLP Time Calculation (min) (ACUTE ONLY): 16 min  Past Medical History:  Past Medical History  Diagnosis Date  . GERD (gastroesophageal reflux disease)   . Asthma   . Hx MRSA infection     on face  . Gastroparesis   . Diabetic neuropathy   . Seizures   . Family history of anesthesia complication     Pt mother can't have epidural procedures  . Dysrhythmia   . Pneumonia   . Arthritis   . Fibromyalgia   . 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 Type I diabetes mellitus     sees Dr. Loanne Drilling   . Syncope 02/16/2015   Past Surgical History:  Past Surgical History  Procedure Laterality Date  . Tonsillectomy    . Multiple extractions with alveoloplasty N/A 08/03/2014    Procedure: MULTIPLE EXTRACTIONS;  Surgeon: Gae Bon, DDS;   Location: [place];  Service: Oral Surgery;  Laterality: N/A;  . Tee without cardioversion N/A 08/17/2014    Procedure: TRANSESOPHAGEAL ECHOCARDIOGRAM (TEE);   Surgeon: Dorothy Spark, MD;  Location: [facility] ENDOSCOPY;  Service: Cardiovascular;   Laterality: N/A;  . Debridment of decubitus ulcer N/A 10/04/2014    Procedure: DEBRIDMENT OF DECUBITUS ULCER;  Surgeon: Georganna Skeans, MD;   Location: [place];  Service: General;  Laterality: N/A;  . Laparoscopic diverted colostomy N/A 10/12/2014    Procedure: LAPAROSCOPIC DIVERTING COLOSTOMY;  Surgeon: Donnie Mesa,  MD;  Location: [place];  Service: General;  Laterality: N/A;  . Insertion of suprapubic catheter N/A 10/12/2014  Procedure: INSERTION OF SUPRAPUBIC CATHETER;  Surgeon: Reece Packer, MD;  Location: [place];  Service: Urology;  Laterality: N/A;  . Incision and drainage of wound N/A 11/12/2014    Procedure: IRRIGATION AND DEBRIDEMENT OF WOUNDS WITH BONE BIOPSY AND  SURGICAL PREP ;  Surgeon: Theodoro Kos, DO;  Location: [place];  Service:  Plastics;  Laterality: N/A;  . Application of a-cell of back N/A 11/12/2014    Procedure: APPLICATION A CELL AND VAC ;  Surgeon: Theodoro Kos, DO;   Location: [place];  Service: Plastics;  Laterality: N/A;  . Incision and drainage of wound N/A 11/18/2014    Procedure: IRRIGATION AND DEBRIDEMENT OF SACRAL ULCER ONLY WITH  PLACEMENT OF A CELL AND VAC/ DRESSING CHANGE TO UPPER BACK AREA.;   Surgeon: Theodoro Kos, DO;  Location: [place];  Service: Plastics;   Laterality: N/A;  . Incision and drainage of wound N/A 11/25/2014    Procedure: IRRIGATION AND DEBRIDEMENT OF SACRAL ULCER AND BACK BURN WITH  PLACEMENT OF A-CELL;  Surgeon: Theodoro Kos, DO;  Location: [place];   Service: Plastics;  Laterality: N/A;  . Minor application of wound vac N/A 11/25/2014    Procedure:  WOUND VAC CHANGE;  Surgeon: Theodoro Kos, DO;  Location: [place];  Service: Plastics;  Laterality: N/A;  . Incision and drainage of wound Right 12/08/2014    Procedure: IRRIGATION AND DEBRIDEMENT SACRAL WOUND AND RIGHT ISCHIAL  WOUND ;  Surgeon: Theodoro Kos, DO;  Location: [place];  Service: Plastics;   Laterality: Right;  . Application  of a-cell of back N/A 12/08/2014    Procedure: APPLICATION OF A-CELL AND WOUND VAC ;  Surgeon: Theodoro Kos, DO;  Location: [place];  Service: Plastics;  Laterality: N/A;  . Tee without cardioversion N/A 05/05/2015    Procedure: TRANSESOPHAGEAL ECHOCARDIOGRAM (TEE);  Surgeon: Lelon Perla, MD;  Location: [place];  Service: Cardiovascular;   Laterality: N/A;  . Incision and drainage of wound Bilateral 05/05/2015    Procedure: IRRIGATION AND DEBRIDEMENT SACRAL ULCER;  Surgeon: Theodoro Kos, DO;  Location: [place];  Service: Plastics;  Laterality: Bilateral;  . Hematoma evacuation N/A 05/05/2015    Procedure: EVACUATION HEMATOMA bedside procedure;  Surgeon: Theodoro Kos, DO;  Location: [place];  Service: Plastics;  Laterality: N/A;   HPI:  Other Pertinent Information: 29 year old male quad with MRSA bacteremia  from multiple sources. Concern for endocarditis.Patient has history of  hypotension and concern for septic shock. Source of shock was identified  as osteomyelitis of sacral decub, and it was debrided. Intubated 7/31-8/3,  reintubated 8/3 with trach 8/4. NPO recommended following FEES 8/16,  received PEG 8/18. Repeat objective assessment with MBS (should tolerate  better) for possible pleasure po.  No Data Recorded  Assessment / Plan / Recommendation CHL IP CLINICAL IMPRESSIONS 05/30/2015  Therapy Diagnosis Mild oral phase dysphagia;Moderate pharyngeal phase  dysphagia  Clinical Impression Pt exhibited mild oral dysphagia with delayed  anterior-posterior transit. Moderate sensorimotor pharyngeal phase  dysphagia with decreased sensation resulting in delayed swallow initiation  to valleculae. Silent laryngeal penetration to vocal cords with nectar and  honey and into vestibule with puree (silent). Reduced tongue base  retraction led to moderate vallecular residue and decreased laryngeal  elevation resulting in mild-moderate pyriform sinus residue. Verbal cues  for multiple swallows and throat clear were  ineffective (SLP assist for  quad cough) to clear penetrates or decrease residue. Aspiration risk high  currently, however  prognosis for po's is good.          CHL IP TREATMENT RECOMMENDATION 05/30/2015  Treatment Recommendations Therapy as outlined in treatment plan below     CHL IP DIET RECOMMENDATION 05/30/2015  SLP Diet Recommendations NPO  Liquid Administration via (None)  Medication Administration Via alternative means  Compensations (None)  Postural Changes and/or Swallow Maneuvers (None)     CHL IP OTHER RECOMMENDATIONS 05/30/2015  Recommended Consults (None)  Oral Care Recommendations Oral care QID  Other Recommendations (None)     CHL IP FOLLOW UP RECOMMENDATIONS 05/27/2015  Follow up Recommendations 24 hour supervision/assistance     CHL IP FREQUENCY AND DURATION 05/30/2015  Speech Therapy Frequency (ACUTE ONLY) min 2x/week  Treatment Duration 2 weeks     Pertinent Vitals/Pain none    SLP Swallow Goals No flowsheet data found.  No flowsheet data found.    CHL IP REASON FOR REFERRAL 05/30/2015  Reason for Referral Objectively evaluate swallowing function               No flowsheet data found.         [name] 05/30/2015, 11:39 AM  Orbie Pyo Colvin Caroli.Ed Engineer, agricultural [phone]       ASSESSMENT / PLAN:  Acute hypoxemia respiratory failure > due to pulm edema vs HCAP with atelectasis  Pleural effusion R base, small Collapse rt base RML, RLL> improved post bronch   Trach 8/5 >> Plan:  Continue daytime ATC as tolerated with mandatory qhs vent  Planning for home noct vent Mother has done her night shift w/ home vent, now need to ensure that the father does, then we can d/c him home Chest PT In-line PMV   Autonomic dysfunction w/ associated hypotension  Right Arm PICC 7/19>>> Plan:  Observe off steroids Continue Midodrine 20mg  PO TID  Hyperkalemia  Plan Kayexalate X 1 Repeat K in am   Hypomagnesemia  Plan: Repeat replacement Recheck in am   intermittent Leaking  supra-pubic foley; now s/p new suprapubic cath placed 8/17. Leaking again on 8/23-->mother says fairly common occurrence  Plan:  Ck UA and culture    Severe malnutrition Dysphagia - failed FEES 8/16 Plan:  Zofran PRN nausea PPI  PEG placed 8/18: started tube feeds  (8/19)  Anemia - due to blood loss from sacral wound and severe peri op bleeding from debridement, dilution >> Transfused 1 unit pRBC 8/12 DVT prevention Leukocytosis -decreasing May 2016 PE IVC filter placed 8/6 Plan:  Trend CBC in am  SCD Monitor for bleeding   MRSA bacteremia w/ neg TEE, likely osteomyelitis source E coli UTI treated MDR acinatobacter HCAP Vanc 7/18 >   8/11  Aztreonam 7/18>>>8/3 8/1 cipro>>>8/3 Plan:  8/3 tobra  >> 8/25 (planned) Imipenem >> 8/25 (planned) zyvox >> 8/26 (planned), then vanc >> 9/18 (planned) Once finish abx course, need to remove all lines   Possible adrenal insufficiency  T1 DM Plan:  SSI Observe off steroids  Added low dose lantus 8/17  Quad post trauma Post op pain  Acute encephalopathy 7/31 > improved/resolved  Plan:  Change fent patch to a higher dose. Need to get him off IV pushes.  remeron qhs   Stage 4 sacral decub Plan - Plastics following peripherally & recommends dressing wet to dry bid - needs air/fluid mattress for home (ordered)  Working towards home vent. Likely wednesday or Thursday   Erick Colace ACNP-BC [place] Pager # [phone] OR # [phone] if no answer  Attending Note:  29 year old quad post trauma presenting with would infection and septic shock originally. Had to be placed back on the vent. I placed him on wean this AM and to my surprise he tolerated it well. Bibasilar crackles but tolerated. I reviewed his CXR myself, evidence of hypoinflation. Discussed with PCCM-NP. Mother updated bedside and all questions answered. Will continue abx as ordered. That is still to be determined. Hold  in SDU for now. Failed swallow evaluation will repeat. Will continue feeding via PEG. Spoke with CSW, will begin mother staying in the room tonight and manage vent and anticipate discharge Wednesday or Thursday depending on how training is going.  Rush Farmer, M.D. [facility] Pulmonary/Critical Care Medicine. Pager: [phone]. After hours pager: [phone].

## 2015-05-31 NOTE — Progress Notes (Signed)
[name] states that she will place patient on home vent in about an hour. RT will continue to monitor.

## 2015-05-31 NOTE — Progress Notes (Addendum)
Nutrition Consult/Follow Up  DOCUMENTATION CODES:   Severe malnutrition in context of acute illness/injury, Underweight  INTERVENTION:    Recommend continuous TF regimen of Vital AF 1.2 formula at goal rate of 70 ml/hr (2016 kcals, 126 gm protein, 1362 ml of free water)  NUTRITION DIAGNOSIS:   Increased nutrient needs related to wound healing as evidenced by estimated needs; ongoing  GOAL:   Patient will meet greater than or equal to 90% of their needs; met  MONITOR:   Vent status, TF tolerance, Skin, I & O's, Labs, Weight trends  ASSESSMENT:   29 year old male who has a past medical history of GERD (gastroesophageal reflux disease); Asthma; MRSA infection; Gastroparesis; Diabetic neuropathy; Seizures; Family history of anesthesia complication; Dysrhythmia; Pneumonia; Arthritis; Fibromyalgia; Stroke, Type I diabetes mellitus; and Syncope.   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 Patient was sent to the ER from the physical therapy office today after patient was found to be hypotensive. [name] says that he has not been eating and drinking well for past twoweeks. In the ED patient was found to be dehydrated, had white count of 20,000, chest x-ray showing pneumonia as well as abnormal UA consistent with UTI.  Patient is currently on ATC.  Patient s/p procedure 8/18: IR GASTROSTOMY TUBE MOD SED   S/p MBSS 8/22.  SLP recommending continued NPO status.  Vital AF 1.2 formula currently infusing at goal rate of 70 ml/hr via PEG tube providing 2016 kcals, 126 gm protein, 1362 ml of free water.  Tolerating well.  Diet Order:  NPO  Skin:  Wound (see comment) (Stage 4 sacral pressure ulcer, Stage 3 L heel pressure ulcer, Incision to back)  Last BM:  8/23  Height:   Ht Readings from Last 1 Encounters:  04/25/15 5' 8"  (1.727 m)    Weight:   Wt Readings from Last 10 Encounters:  05/31/15 118 lb (53.524 kg)  04/01/15 130 lb (58.968  kg)  02/20/15 111 lb 15.9 oz (50.8 kg)  02/16/15 112 lb (50.803 kg)  01/12/15 128 lb 4.9 oz (58.2 kg)  12/19/14 130 lb 1.1 oz (59 kg)  11/25/14 155 lb (70.308 kg)  11/19/14 155 lb 13.8 oz (70.7 kg)  10/02/14 125 lb 10.6 oz (57 kg)  09/23/14 131 lb 1.6 oz (59.467 kg)    Ideal Body Weight:  70 kg (kg)  BMI:  Body mass index is 17.95 kg/(m^2).  Estimated Nutritional Needs:   Kcal:  1800-2000  Protein:  110-120 gm  Fluid:  per MD  CBG (last 3)   Recent Labs  05/30/15 2037 05/31/15 0016 05/31/15 0406  GLUCAP 154* 249* 217*    EDUCATION NEEDS:   No education needs identified at this time  Arthur Holms, RD, LDN Pager #: [phone] After-Hours Pager #: [phone]

## 2015-05-31 NOTE — Discharge Summary (Signed)
Physician Discharge Summary       Patient ID: JAVEN HINDERLITER MRN: [id number] DOB/AGE: [birth year] 29 y.o.  Admit date: 04/25/2015 Discharge date: 06/01/2015  Discharge Diagnoses:   Acute on chronic hypoxemia respiratory failure Acinetobacter VAP/ HCAP Atelectasis Pulmonary edema Pleural effusion R base, small Septic shock Autonomic dysfunction  Shock resolved Relative adrenal insufficiency Hypomagnesemia  hyperkalemia  Intermittently Leaking supra-pubic foley; now s/p new suprapubic cath placed 8/17  Severe malnutrition Dysphagia - failed FEES 8/16 PEG placed 8/18 Anemia Leukocytosis -decreasing 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 IVC filter placed 8/6 MRSA bacteremia Microaerophillic strep bacteremia likely osteomyelitis source E coli UTI  MDR acinatobacter HCAP T1 DM Quad post trauma Chronic pain   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 Presented from his PT office to the ED after found to be hypotensive. Further ROS found him to deny: SOB, nausea, vomiting of diarrhea. Mother reported decreased appetite. Dx eval in the ER showed what looked c/w PNA and therefore he was admitted and treated in the usual fashion. He was also found to have a VAC in place for a sacral wound he has had and been followed for at the wound clinic, as well as a UTI. He was pan cultured, and WOC was consulted to assess w/ wound eval and make recommendation. Blood cultures came back positive for MRSA. Surgical services were consulted and started doing wet to dry dressing the the sacral wound. PCCM was consulted  on 7/24 for hypotension and at that time he was admitted to the ICU -pressors were started. He was started on fluid volume resuscitation .  He was admitted to the intensive care on 7/24 and central access via PIV was placed. He was hypotensive so he was started on levophed.  Ultimately the following culture data came back as a concern: MRSA from sacral wound, and acinetobacter from respiratory culture.  The high-lights of his hospital stay were as follows:  7/18 vanc, cipro and azactam started. BC positive for MRSA and strep  7/28 TEE >> NO vegetation, normal LV function  7/31 Bronch >> HCAP, extensive mucous plug 8/2 bronch - no sig obstruction 8/2- family does not want trach at this point even though is recommended 8/3 bronch lungs open, clear, extubated, re intubated lat evening due to collapse.  8/3 BAL results back: abundant Acinetobacter: started on Tigecycline and Gent.  8/3 extubated  8/4- re-intubated.  trach placed later that day  8/8 weaned 10+ hours 8/12 still growing GNR from resp culture inspite if abx. Imipenem and gent started. vanc switched to zyvox.   05/22/15: nad on vent/ no new concerns per nursing/ ID note reviewed and strongly rec palliative care eval  8/15 final recs from ID: Acinetobacter VAP: complete 14d imipenem and gent (plan to stop 8/25); MRSA and Microaerophillic strep bacteremia: zyvox until 8/26; then vanc until sept 18th.  8/18 home health DMEs evaluated home (OK for home vent). Got PEG tube done in IR. Holding chest PT d/t PEG and discomfort.  8/19 Tubefeeds resumed.  8/22 failed swallow eval 8/23 Mother completed overnight vent training. suprapub intermittently leaking. Kayexalate given for hyperkalemia. Fentanyl patch increased to 174mcg/hr as requiring freq PRN doses 8/24  father completed his HS training for vent. PICC line placed.  As of 8/24 he is now hemodynamically stable. His parents have been trained on home ventilator and tracheostomy care. He will  be discharged to home with the following plan of care as outlined below.   * of note extensive goals of care were discussed w/ the patient and his mother. In spite of the reality that his bacteremia was almost certainly coming from the osteomyelitis which has very low chance of cure she continues to desire aggressive care. We have discussed the concern that he will almost certainly become infected once again after antibiotics have completed.   Discharge Plan by active problems   Acute on chronic hypoxemia respiratory failure  HCAP with atelectasis  Tracheostomy dependence Trach placed 8/5 Plan:  Continue daytime ATC as tolerated with mandatory qhs vent  Home vent settings:  AC vt 570, rr 16, PEEP 5, itime 0.8 sec; FIO2 40% PMV as tolerated  F/u trach clinic 4 weeks for trach change (our office will call) F/u pulmonary on 9/23 w/ Dr Chase Caller   Acinetobacter HCAP Plan complete course of  imipenem and gent  *note he tolerated carbapenem therapy given PCN allergy*  MRSA and strep bacteremia w/ neg TEE, likely osteomyelitis source Plan:  Switch zyvox to IV vanc to start 8/25 and continue thru 9/18 (this will complete 6 week course of treatment) Once finish abx course, need to remove all lines   Autonomic dysfunction w/ associated hypotension  Plan:  Continue Midodrine 20mg  PO TID  intermittent Leaking supra-pubic foley; now s/p new suprapubic cath placed 8/17. Leaking again on 8/23-->mother says fairly common occurrence  Plan:  Routine SP cath change q 30d.  Keep next scheduled appointment w/ urology   Severe malnutrition Dysphagia - failed FEES 8/16 PEG placed 8/18: started tube feeds (8/19) Plan:  Zofran PRN nausea PPI  Continuous tube feeds   Anemia  May 2016 PE IVC filter placed 8/6 Plan:  Keep IVC indef.  Will not resume eliquis as he will almost certainly require debridement in the future and anticipate bleeding to again be an issue.   Possible  adrenal insufficiency  T1 DM Plan:  Resume home insulin regimen   Quad post trauma Post op pain  Plan:  Changed fent patch to 100 mcg/hr remeron qhs   Stage 4 sacral decub Left foot ulcer Back decubitus  Plan - home w/ BID wet to dry sacrum - left foot and back dressing collagen w/ moist NS change M/W/F - mom has set up f/u w/ wound care  - needs air/fluid mattress for home (ordered)   Significant Hospital tests/ studies  Consults:  Dr. Lyndee Leo Sanger (plastics); Dr Havery Moros (ortho); Dr Page Spiro et al., (ID); Dr B Hoxworth et al.,  (general surgery); H McCullough et al., (IR) Also seen by Speech therapy, Wound ostomy nurse, RD and RT  Studies CT pelvis 7/25: Moderate ascites. Moderate anasarca is noted.Status post surgical resection of distal sacrum and coccyx with overlying sacral decubitus ulcer. Large deep ulceration is seen involving right buttocks area which extends to the cortex of the right ischial tuberosity, where some lucency is noted suggesting osteomyelitis. Two abscesses are noted in this area along the medial portion of the right acetabulum which appear to communicate posteriorly. 7/21 MR pelvis: 1. Right internal obturator abscess, appearing to extend from the right ischial tuberosity decubitus ulcer. 2. Persistent abnormal signal from the right inferior pubic ramus remnants extending into the posterior aspect of  the right acetabulum. 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  Procedures  7/28: to OR  for debridement of osteomyelitis and abscess cx with staph. Positive MRSA and strep blood cx 7/28: surgery Reconsulted for extensive post op bleeding and hypotension 8/4:  Bronch and Perc trach placed by Titus Mould.  8/18 IR gastrostomy tube 8/24 single lumen PICC line  Discharge Exam: BP 102/65 mmHg  Pulse 89  Temp(Src) 98.9 F (37.2 C) (Axillary)  Resp 28  Ht 5\' 8"  (1.727 m)  Wt 50.349 kg (111 lb)  BMI 16.88 kg/m2  SpO2 100%   Gen: chronically ill  appearing, NAD on ATC HENT: trach clean, dry, good phonation through PMV PULM: resps even non labored on ATC, diminished bases, occ rhonchi  CV: RRR, no mgr, trace edema arms, feet GI: BS+, soft, nontender. PEG unremarkable  Derm: sacral wound stage 4 plus Neuro: Awake, alert, nods approp  Labs at discharge Lab Results  Component Value Date   CREATININE 0.69 06/01/2015   BUN 23* 06/01/2015   NA 140 06/01/2015   K 4.3 06/01/2015   CL 111 06/01/2015   CO2 20* 06/01/2015   Lab Results  Component Value Date   WBC 12.5* 05/30/2015   HGB 8.9* 05/30/2015   HCT 27.5* 05/30/2015   MCV 91.4 05/30/2015   PLT 699* 05/30/2015   Lab Results  Component Value Date   ALT 11* 05/27/2015   AST 14* 05/27/2015   ALKPHOS 102 05/27/2015   BILITOT 0.7 05/27/2015   Lab Results  Component Value Date   INR 1.10 05/26/2015   INR 4.44* 05/20/2015   INR 1.80* 05/06/2015    Current radiology studies Dg Swallowing Func-speech Pathology  05/30/2015    Objective Swallowing Evaluation:   Modified Barium Swallow Patient Details  Name: SIMEON VERA MRN: [id number] Date of Birth: [birth year]  Today's Date: 05/30/2015 Time: SLP Start Time (ACUTE ONLY): 1035-SLP Stop Time (ACUTE ONLY): 1051 SLP Time Calculation (min) (ACUTE ONLY): 16 min  Past Medical History:  Past Medical History  Diagnosis Date  . GERD (gastroesophageal reflux disease)   . Asthma   . Hx MRSA infection     on face  . Gastroparesis   . Diabetic neuropathy   . Seizures   . Family history of anesthesia complication     Pt mother can't have epidural procedures  . Dysrhythmia   . Pneumonia   . Arthritis   . Fibromyalgia   . 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 Type I diabetes mellitus     sees Dr. Loanne Drilling   . Syncope 02/16/2015   Past Surgical History:  Past Surgical History  Procedure Laterality Date  . Tonsillectomy    . Multiple extractions with alveoloplasty N/A 08/03/2014    Procedure: MULTIPLE EXTRACTIONS;  Surgeon:  Gae Bon, DDS;   Location: [place];  Service: Oral Surgery;  Laterality: N/A;  . Tee without cardioversion N/A 08/17/2014    Procedure: TRANSESOPHAGEAL ECHOCARDIOGRAM (TEE);  Surgeon: Dorothy Spark, MD;  Location: [facility] ENDOSCOPY;  Service: Cardiovascular;   Laterality: N/A;  . Debridment of decubitus ulcer N/A 10/04/2014    Procedure: DEBRIDMENT OF DECUBITUS ULCER;  Surgeon: Georganna Skeans, MD;   Location: [place];  Service: General;  Laterality: N/A;  . Laparoscopic diverted colostomy N/A 10/12/2014    Procedure: LAPAROSCOPIC DIVERTING COLOSTOMY;  Surgeon: Donnie Mesa,  MD;  Location: [place];  Service: General;  Laterality: N/A;  . Insertion of suprapubic catheter N/A 10/12/2014  Procedure: INSERTION OF SUPRAPUBIC CATHETER;  Surgeon: Reece Packer, MD;  Location: [place];  Service: Urology;  Laterality: N/A;  . Incision and drainage of wound N/A 11/12/2014    Procedure: IRRIGATION AND DEBRIDEMENT OF WOUNDS WITH BONE BIOPSY AND  SURGICAL PREP ;  Surgeon: Theodoro Kos, DO;  Location: [place];  Service:  Plastics;  Laterality: N/A;  . Application of a-cell of back N/A 11/12/2014    Procedure: APPLICATION A CELL AND VAC ;  Surgeon: Theodoro Kos, DO;   Location: [place];  Service: Plastics;  Laterality: N/A;  . Incision and drainage of wound N/A 11/18/2014    Procedure: IRRIGATION AND DEBRIDEMENT OF SACRAL ULCER ONLY WITH  PLACEMENT OF A CELL AND VAC/ DRESSING CHANGE TO UPPER BACK AREA.;   Surgeon: Theodoro Kos, DO;  Location: [place];  Service: Plastics;   Laterality: N/A;  . Incision and drainage of wound N/A 11/25/2014    Procedure: IRRIGATION AND DEBRIDEMENT OF SACRAL ULCER AND BACK BURN WITH  PLACEMENT OF A-CELL;  Surgeon: Theodoro Kos, DO;  Location: [place];   Service: Plastics;  Laterality: N/A;  . Minor application of wound vac N/A 11/25/2014    Procedure:  WOUND VAC CHANGE;  Surgeon: Theodoro Kos, DO;  Location: [place];  Service: Plastics;  Laterality: N/A;  . Incision and drainage of wound Right 12/08/2014     Procedure: IRRIGATION AND DEBRIDEMENT SACRAL WOUND AND RIGHT ISCHIAL  WOUND ;  Surgeon: Theodoro Kos, DO;  Location: [place];  Service: Plastics;   Laterality: Right;  . Application of a-cell of back N/A 12/08/2014    Procedure: APPLICATION OF A-CELL AND WOUND VAC ;  Surgeon: Theodoro Kos, DO;  Location: [place];  Service: Plastics;  Laterality: N/A;  . Tee without cardioversion N/A 05/05/2015    Procedure: TRANSESOPHAGEAL ECHOCARDIOGRAM (TEE);  Surgeon: Lelon Perla, MD;  Location: [place];  Service: Cardiovascular;   Laterality: N/A;  . Incision and drainage of wound Bilateral 05/05/2015    Procedure: IRRIGATION AND DEBRIDEMENT SACRAL ULCER;  Surgeon: Theodoro Kos, DO;  Location: [place];  Service: Plastics;  Laterality: Bilateral;  . Hematoma evacuation N/A 05/05/2015    Procedure: EVACUATION HEMATOMA bedside procedure;  Surgeon: Theodoro Kos, DO;  Location: [place];  Service: Plastics;  Laterality: N/A;   HPI:  Other Pertinent Information: 29 year old male quad with MRSA bacteremia  from multiple sources. Concern for endocarditis.Patient has history of  hypotension and concern for septic shock. Source of shock was identified  as osteomyelitis of sacral decub, and it was debrided. Intubated 7/31-8/3,  reintubated 8/3 with trach 8/4. NPO recommended following FEES 8/16,  received PEG 8/18. Repeat objective assessment with MBS (should tolerate  better) for possible pleasure po.  No Data Recorded  Assessment / Plan / Recommendation CHL IP CLINICAL IMPRESSIONS 05/30/2015  Therapy Diagnosis Mild oral phase dysphagia;Moderate pharyngeal phase  dysphagia  Clinical Impression Pt exhibited mild oral dysphagia with delayed  anterior-posterior transit. Moderate sensorimotor pharyngeal phase  dysphagia with decreased sensation resulting in delayed swallow initiation  to valleculae. Silent laryngeal penetration to vocal cords with nectar and  honey and into vestibule with puree (silent). Reduced tongue base  retraction  led to moderate vallecular residue and decreased laryngeal  elevation resulting in mild-moderate pyriform sinus residue. Verbal cues  for multiple swallows and throat clear were ineffective (SLP assist for  quad cough) to clear penetrates or decrease residue. Aspiration risk high  currently, however  prognosis for po's is good.          CHL IP TREATMENT RECOMMENDATION 05/30/2015  Treatment Recommendations Therapy as outlined in treatment plan below     CHL IP DIET RECOMMENDATION 05/30/2015  SLP Diet Recommendations NPO  Liquid Administration via (None)  Medication Administration Via alternative means  Compensations (None)  Postural Changes and/or Swallow Maneuvers (None)     CHL IP OTHER RECOMMENDATIONS 05/30/2015  Recommended Consults (None)  Oral Care Recommendations Oral care QID  Other Recommendations (None)     CHL IP FOLLOW UP RECOMMENDATIONS 05/27/2015  Follow up Recommendations 24 hour supervision/assistance     CHL IP FREQUENCY AND DURATION 05/30/2015  Speech Therapy Frequency (ACUTE ONLY) min 2x/week  Treatment Duration 2 weeks     Pertinent Vitals/Pain none    SLP Swallow Goals No flowsheet data found.  No flowsheet data found.    CHL IP REASON FOR REFERRAL 05/30/2015  Reason for Referral Objectively evaluate swallowing function               No flowsheet data found.         [name] 05/30/2015, 11:39 AM  Orbie Pyo Colvin Caroli.Ed CCC-SLP Pager [phone]      Disposition:  01-Home or Self Care      Discharge Instructions    Care order/instruction    Complete by:  As directed   Remove PICC line after final VANC dose     For home use only DME Ventilator    Complete by:  As directed   AC vt 570, rr 16, PEEP 5, itime 0.8 sec; FIO2 40%     Home Health    Complete by:  As directed   To provide the following care/treatments:   RN Respiratory Care    Advanced home care: PICC care protocol Continue routine trach care Wet to dry w/ damp NS dressing to sacral wound bid Collagenase w/ damp  saline to left foot and back dressing every M/W/F     Increase activity slowly    Complete by:  As directed             Medication List    STOP taking these medications        apixaban 5 MG Tabs tablet  Commonly known as:  ELIQUIS     B COMPLEX-VITAMIN B12 PO     diclofenac sodium 1 % Gel  Commonly known as:  VOLTAREN     fentaNYL 25 MCG/HR patch  Commonly known as:  DURAGESIC - dosed mcg/hr  Replaced by:  fentaNYL 100 MCG/HR     metoCLOPramide 5 MG tablet  Commonly known as:  REGLAN     metroNIDAZOLE 500 MG tablet  Commonly known as:  FLAGYL     mupirocin ointment 2 %  Commonly known as:  BACTROBAN     nitrofurantoin (macrocrystal-monohydrate) 100 MG capsule  Commonly known as:  MACROBID     omeprazole 40 MG capsule  Commonly known as:  PRILOSEC      TAKE these medications        ACCU-CHEK SMARTVIEW test strip  Generic drug:  glucose blood  Use to check blood sugar 4 times daily.     acetaminophen 325 MG tablet  Commonly known as:  TYLENOL  Take 2 tablets (650 mg total) by mouth every 6 (six) hours as needed for mild pain, moderate pain, fever or headache.     albuterol (2.5 MG/3ML) 0.083% nebulizer solution  Commonly known as:  PROVENTIL  Take 3 mLs (2.5 mg total) by nebulization every 4 (four) hours as needed for wheezing or shortness of breath.     baclofen 10 MG tablet  Commonly known as:  LIORESAL  Take 1-2 tablets (10-20 mg total) by mouth 3 (three) times daily. Takes 10mg  in morning and lunchtime and takes 20mg  at bedtime     collagenase ointment  Commonly known as:  SANTYL  Apply 1 application topically daily. Apply to back and foot wound every M/W/F     feeding supplement (VITAL AF 1.2 CAL) Liqd  Place 1,000 mLs into feeding tube continuous.     fentaNYL 100 MCG/HR  Commonly known as:  DURAGESIC - dosed mcg/hr  Place 1 patch (100 mcg total) onto the skin every 3 (three) days.  Start taking on:  06/03/2015     glucagon 1 MG injection    Commonly known as:  GLUCAGON EMERGENCY  Inject 1 mg into the vein once as needed.     insulin aspart 100 UNIT/ML injection  Commonly known as:  novoLOG  Inject 5 Units into the skin 3 (three) times daily with meals.     insulin glargine 100 UNIT/ML injection  Commonly known as:  LANTUS  Inject 0.05 mLs (5 Units total) into the skin at bedtime.     loratadine 10 MG tablet  Commonly known as:  CLARITIN  Place 1 tablet (10 mg total) into feeding tube at bedtime.     midodrine 10 MG tablet  Commonly known as:  PROAMATINE  Take 2 tablets (20 mg total) by mouth 3 (three) times daily with meals.     mirtazapine 7.5 MG tablet  Commonly known as:  REMERON  Place 1 tablet (7.5 mg total) into feeding tube at bedtime.     multivitamin with minerals Tabs tablet  Place 1 tablet into feeding tube daily.     ondansetron 8 MG disintegrating tablet  Commonly known as:  ZOFRAN ODT  Place 1 tablet (8 mg total) into feeding tube every 8 (eight) hours as needed for nausea or vomiting.     Oxycodone HCl 10 MG Tabs  Place 1 tablet (10 mg total) into feeding tube every 4 (four) hours as needed for moderate pain, severe pain or breakthrough pain.     pantoprazole sodium 40 mg/20 mL Pack  Commonly known as:  PROTONIX  Place 20 mLs (40 mg total) into feeding tube daily.     pregabalin 100 MG capsule  Commonly known as:  LYRICA  Place 1 capsule (100 mg total) into feeding tube 3 (three) times daily. Take 100 mg by mouth in the morning, take 100 mg by mouth in the afternoon, take 100 mg by mouth in the evening and take 200 mg by mouth at bedtime.     vancomycin 1 GM/200ML Soln  Commonly known as:  VANCOCIN  Inject 200 mLs (1,000 mg total) into the vein daily.       Follow-up Information    Follow up with [facility]              In 2 weeks.   Contact information:   [address] [place] [address] [phone]      Follow up  with [facility], MD On 07/01/2015.   Specialty:  Pulmonary Disease   Why:  415 pm    Contact information:   [place] [id number] [phone]  Follow up with [facility] On 06/29/2015.   Specialty:  Respiratory Therapy   Why:  1pm w/ Marni Griffon ACNP   Contact information:   [place] [id number] [place] [phone]      Discharged Condition: good  Physician Statement:   The Patient was personally examined, the discharge assessment and plan has been personally reviewed and I agree with ACNP Zed Wanninger's assessment and plan. > 30 minutes of time have been dedicated to discharge assessment, planning and discharge instructions.   Signed: Esme Freund,PETE 06/01/2015, 10:30 AM

## 2015-06-01 DIAGNOSIS — J9611 Chronic respiratory failure with hypoxia: Secondary | ICD-10-CM

## 2015-06-01 LAB — GLUCOSE, CAPILLARY
GLUCOSE-CAPILLARY: 30 mg/dL — AB (ref 65–99)
GLUCOSE-CAPILLARY: 155 mg/dL — AB (ref 65–99)
GLUCOSE-CAPILLARY: 171 mg/dL — AB (ref 65–99)
Glucose-Capillary: 106 mg/dL — ABNORMAL HIGH (ref 65–99)
Glucose-Capillary: 101 mg/dL — ABNORMAL HIGH (ref 65–99)

## 2015-06-01 LAB — BASIC METABOLIC PANEL
ANION GAP: 9 (ref 5–15)
BUN: 23 mg/dL — ABNORMAL HIGH (ref 6–20)
CO2: 20 mmol/L — ABNORMAL LOW (ref 22–32)
Calcium: 8.8 mg/dL — ABNORMAL LOW (ref 8.9–10.3)
Chloride: 111 mmol/L (ref 101–111)
Creatinine, Ser: 0.69 mg/dL (ref 0.61–1.24)
GFR calc Af Amer: >60 mL/min (ref >60)
Glucose, Bld: 41 mg/dL — CL (ref 65–99)
POTASSIUM: 4.3 mmol/L (ref 3.5–5.1)
SODIUM: 140 mmol/L (ref 135–145)

## 2015-06-01 LAB — MAGNESIUM: Magnesium: 1.9 mg/dL (ref 1.7–2.4)

## 2015-06-01 MED ORDER — MIRTAZAPINE 7.5 MG PO TABS: 7.5000 mg | ORAL_TABLET | Freq: Every day | ORAL | Status: DC

## 2015-06-01 MED ORDER — PREGABALIN 100 MG PO CAPS: 100.0000 mg | ORAL_CAPSULE | Freq: Three times a day (TID) | ORAL | Status: DC

## 2015-06-01 MED ORDER — SODIUM CHLORIDE 0.9 % IJ SOLN: 10.0000 mL | INTRAMUSCULAR | Status: DC | PRN

## 2015-06-01 MED ORDER — LORATADINE 10 MG PO TABS: 10.0000 mg | ORAL_TABLET | Freq: Every day | ORAL | Status: DC

## 2015-06-01 MED ORDER — INSULIN GLARGINE 100 UNIT/ML ~~LOC~~ SOLN: 5.0000 [IU] | Freq: Every day | SUBCUTANEOUS | Status: DC

## 2015-06-01 MED ORDER — VANCOMYCIN HCL IN DEXTROSE 1-5 GM/200ML-% IV SOLN
1000.0000 mg | INTRAVENOUS | Status: DC
  Administered 2015-06-01: 1000 mg via INTRAVENOUS
  Filled 2015-06-01: qty 200

## 2015-06-01 MED ORDER — DEXTROSE 50 % IV SOLN
INTRAVENOUS | Status: AC
  Administered 2015-06-01: 25 mL
  Filled 2015-06-01: qty 50

## 2015-06-01 MED ORDER — HEPARIN SOD (PORK) LOCK FLUSH 100 UNIT/ML IV SOLN
250.0000 [IU] | INTRAVENOUS | Status: AC | PRN
  Administered 2015-06-01: 250 [IU]

## 2015-06-01 MED ORDER — ONDANSETRON 8 MG PO TBDP: 8.0000 mg | ORAL_TABLET | Freq: Three times a day (TID) | ORAL | Status: AC | PRN

## 2015-06-01 MED ORDER — VITAL AF 1.2 CAL PO LIQD: 1000.0000 mL | ORAL | Status: DC

## 2015-06-01 MED ORDER — MIDODRINE HCL 10 MG PO TABS: 20.0000 mg | ORAL_TABLET | Freq: Three times a day (TID) | ORAL | Status: DC

## 2015-06-01 MED ORDER — OXYCODONE HCL 10 MG PO TABS: 10.0000 mg | ORAL_TABLET | ORAL | Status: DC | PRN

## 2015-06-01 MED ORDER — PANTOPRAZOLE SODIUM 40 MG PO PACK: 40.0000 mg | PACK | Freq: Every day | ORAL | Status: DC

## 2015-06-01 MED ORDER — VANCOMYCIN HCL IN DEXTROSE 1-5 GM/200ML-% IV SOLN: 1000.0000 mg | INTRAVENOUS | Status: AC

## 2015-06-01 MED ORDER — COLLAGENASE 250 UNIT/GM EX OINT: 1.0000 "application " | TOPICAL_OINTMENT | Freq: Every day | CUTANEOUS | Status: DC

## 2015-06-01 MED ORDER — FENTANYL 100 MCG/HR TD PT72: 100.0000 ug | MEDICATED_PATCH | TRANSDERMAL | Status: DC

## 2015-06-01 MED ORDER — ADULT MULTIVITAMIN W/MINERALS CH: 1.0000 | ORAL_TABLET | Freq: Every day | ORAL | Status: AC

## 2015-06-01 NOTE — Progress Notes (Signed)
Patient was discharged during [facility]. He was transported per [facility] by stretcher. Patient's belongings were sent home with his family. Unable to print discharge for patient as system was down. Patient to receive Home Health with [facility] at home.

## 2015-06-01 NOTE — Progress Notes (Signed)
CSW informed of pt anticipated DC today (8/24) home  CSW made PTAR packet and placed by chart- RN to call PTAR [phone]) when pt is ready for DC- informed of pt home address, oxygen/trach needs, rm number, pt name, and when pt is ready for pick up  Patient will discharge to home Anticipated discharge date:06/01/15 Family notified: mom Transportation by [employer]- [phone]  [place] signing off.  Domenica Reamer, [place] Social Worker [phone]

## 2015-06-01 NOTE — Progress Notes (Signed)
Peripherally Inserted Central Catheter/Midline Placement  The IV Nurse has discussed with the patient and/or persons authorized to consent for the patient, the purpose of this procedure and the potential benefits and risks involved with this procedure.  The benefits include less needle sticks, lab draws from the catheter and patient may be discharged home with the catheter.  Risks include, but not limited to, infection, bleeding, blood clot (thrombus formation), and puncture of an artery; nerve damage and irregular heat beat.  Alternatives to this procedure were also discussed.  PICC/Midline Placement Documentation        Jason [name] 06/01/2015, 2:51 PM

## 2015-06-01 NOTE — Progress Notes (Signed)
CRITICAL VALUE ALERT  Critical value received:  Blood sugar 41  Date of notification:  06/01/2015  Time of notification:  5:56 AM  Critical value read back:Yes.    Nurse who received alert:  Starla Link, RN   MD notified (1st page):  Baltazar Najjar [facility]  Time of first page:  5:56 AM  MD notified (2nd page):  Time of second page:  Responding MD:    Time MD responded:     CBG corrected at this time.

## 2015-06-01 NOTE — Progress Notes (Signed)
CRITICAL VALUE ALERT  Critical value received:  CBG 35, repeat 30  Date of notification:  06/01/2015    Time of notification:  4:43 AM   Critical value read back:Yes.    Nurse who received alert:  Starla Link, RN  MD notified (1st page):  TRH: Baltazar Najjar  Time of first page:  4:44 AM   MD notified (2nd page):  Time of second page:  Responding MD:    Time MD responded:

## 2015-06-01 NOTE — Progress Notes (Signed)
Hypoglycemic Event  CBG: 35, recheck 30  Treatment: D50 IV 25 mL  Symptoms: None  Follow-up CBG: Time: 0510 CBG Result:106  Possible Reasons for Event: Inadequate meal intake  Comments/MD notified: Tube feeding leaked, no longer leaking. TRH Baltazar Najjar) notified.    [name], Jason Gills  Remember to initiate Hypoglycemia Order Set & complete

## 2015-06-01 NOTE — Progress Notes (Signed)
Patient's sats have remained stable throughout the night on the vent with no O2 bled in. Patient now has an SpO2 of 100% RT will continue to monitor.

## 2015-06-01 NOTE — Progress Notes (Signed)
ANTIBIOTIC CONSULT NOTE - FOLLOW UP  Pharmacy Consult for Imipenem, Tobramycin, and vancomycin Indication: Acinetobacter PNA / MRSA bacteremia  Allergies  Allergen Reactions  . Cefuroxime Axetil Anaphylaxis  . Ertapenem Other (See Comments)    Rash and confusion-->tolerated Imipenem   . Morphine And Related Other (See Comments)    Changed mental status, confusion, headache, visual hallucination  . Penicillins Anaphylaxis and Other (See Comments)    ?can take amoxicillin? Tolerated Imipenem   . Sulfa Antibiotics Anaphylaxis, Shortness Of Breath and Other (See Comments)  . Tessalon [Benzonatate] Anaphylaxis  . Shellfish Allergy Itching and Other (See Comments)    Took benadryl to alleviate reaction  . Miripirium Rash and Other (See Comments)    Change in mental status    Patient Measurements: Height: 5\' 8"  (172.7 cm) Weight: 111 lb (50.349 kg) IBW/kg (Calculated) : 68.4  Vital Signs: Temp: 98.9 F (37.2 C) (08/24 0852) Temp Source: Axillary (08/24 0852) BP: 102/65 mmHg (08/24 0852) Pulse Rate: 89 (08/24 0832) Intake/Output from previous day: 08/23 0701 - 08/24 0700 In: 1873 [I.V.:3; NG/GT:770; IV Piggyback:1100] Out: 3300 [Urine:1100; Stool:2200] Intake/Output from this shift:    Labs:  Recent Labs  05/30/15 0521 05/31/15 0316 06/01/15 0454  WBC 12.5*  --   --   HGB 8.9*  --   --   PLT 699*  --   --   CREATININE 0.46* 0.48* 0.69   Estimated Creatinine Clearance: 96.1 mL/min (by C-G formula based on Cr of 0.69).  Recent Labs  05/31/15 0316  [place] 1.4     Microbiology: Recent Results (from the past 720 hour(s))  Culture, blood (routine x 2)     Status: None   Collection Time: 05/04/15  6:30 PM  Result Value Ref Range Status   Specimen Description BLOOD PICC LINE  Final   Special Requests BOTTLES DRAWN AEROBIC AND ANAEROBIC 5ML  Final   Culture   Final    NO GROWTH 5 DAYS Performed at [facility]    Report Status 05/09/2015 FINAL  Final   Culture, blood (routine x 2)     Status: None   Collection Time: 05/04/15  6:30 PM  Result Value Ref Range Status   Specimen Description BLOOD PICC LINE  Final   Special Requests BOTTLES DRAWN AEROBIC AND ANAEROBIC 5ML  Final   Culture   Final    NO GROWTH 5 DAYS Performed at [name]    Report Status 05/09/2015 FINAL  Final  Anaerobic culture     Status: None   Collection Time: 05/05/15  1:01 PM  Result Value Ref Range Status   Specimen Description ABSCESS  Final   Special Requests SPEC A ON SWAB RIGHT PELVIC ABSCESS  Final   Gram Stain   Final    FEW WBC PRESENT, PREDOMINANTLY MONONUCLEAR NO SQUAMOUS EPITHELIAL CELLS SEEN FEW GRAM POSITIVE COCCI IN PAIRS IN CLUSTERS Performed at [facility]    Culture   Final    NO ANAEROBES ISOLATED Performed at [facility]    Report Status 05/10/2015 FINAL  Final  Culture, routine-abscess     Status: None   Collection Time: 05/05/15  1:01 PM  Result Value Ref Range Status   Specimen Description ABSCESS  Final   Special Requests SPEC A ON SWAB RIGHT PELVIC ABSCESS  Final   Gram Stain   Final    FEW WBC PRESENT, PREDOMINANTLY MONONUCLEAR NO SQUAMOUS EPITHELIAL CELLS SEEN FEW GRAM POSITIVE COCCI IN PAIRS IN CLUSTERS  Performed at [facility]   Final    MODERATE METHICILLIN RESISTANT STAPHYLOCOCCUS AUREUS Note: RIFAMPIN AND GENTAMICIN SHOULD NOT BE USED AS SINGLE DRUGS FOR TREATMENT OF STAPH INFECTIONS. CRITICAL RESULT CALLED TO, READ BACK BY AND VERIFIED WITH: IREKIA B. 8/1 @216  BY REAMM Performed at [facility]    Report Status 05/09/2015 FINAL  Final   Organism ID, Bacteria METHICILLIN RESISTANT STAPHYLOCOCCUS AUREUS  Final      Susceptibility   Methicillin resistant staphylococcus aureus - MIC*    CLINDAMYCIN >=8 RESISTANT Resistant     ERYTHROMYCIN >=8 RESISTANT Resistant     GENTAMICIN <=0.5 SENSITIVE Sensitive     LEVOFLOXACIN >=8 RESISTANT Resistant     OXACILLIN  >=4 RESISTANT Resistant     RIFAMPIN <=0.5 SENSITIVE Sensitive     TRIMETH/SULFA >=320 RESISTANT Resistant     VANCOMYCIN 1 SENSITIVE Sensitive     TETRACYCLINE <=1 SENSITIVE Sensitive     * MODERATE METHICILLIN RESISTANT STAPHYLOCOCCUS AUREUS  Tissue culture     Status: None   Collection Time: 05/05/15  1:13 PM  Result Value Ref Range Status   Specimen Description TISSUE  Final   Special Requests SPEC B IN CUP BONE RIGHT ISCHIUM  Final   Gram Stain   Final    RARE WBC PRESENT, PREDOMINANTLY MONONUCLEAR NO ORGANISMS SEEN Performed at [facility]    Culture   Final    FEW METHICILLIN RESISTANT STAPHYLOCOCCUS AUREUS Note: RIFAMPIN AND GENTAMICIN SHOULD NOT BE USED AS SINGLE DRUGS FOR TREATMENT OF STAPH INFECTIONS. CRITICAL RESULT CALLED TO, READ BACK BY AND VERIFIED WITH: IREKIA B. 8/1 @216  BY REAMM Performed at [facility]    Report Status 05/09/2015 FINAL  Final   Organism ID, Bacteria METHICILLIN RESISTANT STAPHYLOCOCCUS AUREUS  Final      Susceptibility   Methicillin resistant staphylococcus aureus - MIC*    CLINDAMYCIN >=8 RESISTANT Resistant     ERYTHROMYCIN >=8 RESISTANT Resistant     GENTAMICIN <=0.5 SENSITIVE Sensitive     LEVOFLOXACIN >=8 RESISTANT Resistant     OXACILLIN >=4 RESISTANT Resistant     RIFAMPIN <=0.5 SENSITIVE Sensitive     TRIMETH/SULFA >=320 RESISTANT Resistant     VANCOMYCIN 1 SENSITIVE Sensitive     TETRACYCLINE <=1 SENSITIVE Sensitive     * FEW METHICILLIN RESISTANT STAPHYLOCOCCUS AUREUS  Culture, respiratory (NON-Expectorated)     Status: None   Collection Time: 05/08/15  8:36 AM  Result Value Ref Range Status   Specimen Description TRACHEAL ASPIRATE  Final   Special Requests Normal  Final   Gram Stain   Final    RARE WBC PRESENT, PREDOMINANTLY PMN NO SQUAMOUS EPITHELIAL CELLS SEEN FEW GRAM NEGATIVE RODS RARE GRAM POSITIVE COCCI IN PAIRS RARE YEAST    Culture   Final    ABUNDANT ACINETOBACTER CALCOACETICUS/BAUMANNII  COMPLEX Note: TIGECYCLINE  MIC 1 COLISTIN SENSITIVE .0125 ug/mL  ETEST results for this drug are "FOR INVESTIGATIONAL USE ONLY" and should NOT be used for clinical purposes. Performed at [facility]    Report Status 05/12/2015 FINAL  Final   Organism ID, Bacteria ACINETOBACTER CALCOACETICUS/BAUMANNII COMPLEX  Final      Susceptibility   Acinetobacter calcoaceticus/baumannii complex - MIC*    AMPICILLIN >=32 RESISTANT Resistant     AMPICILLIN/SULBACTAM 4 SENSITIVE Sensitive     CEFAZOLIN >=64 RESISTANT Resistant     CEFTAZIDIME >=64 RESISTANT Resistant     CEFTRIAXONE >=64 RESISTANT Resistant  CIPROFLOXACIN >=4 RESISTANT Resistant     GENTAMICIN <=1 SENSITIVE Sensitive     IMIPENEM 0.5 SENSITIVE Sensitive     PIP/TAZO >=128 RESISTANT Resistant     TOBRAMYCIN <=1 SENSITIVE Sensitive     TRIMETH/SULFA >=320 RESISTANT Resistant     * ABUNDANT ACINETOBACTER CALCOACETICUS/BAUMANNII COMPLEX  Culture, blood (routine x 2)     Status: None   Collection Time: 05/10/15  3:05 PM  Result Value Ref Range Status   Specimen Description Blood  Final   Special Requests Normal  Final   Culture  Setup Time TEST WILL BE CREDITED TEST CANCELLED PER RN   Final   Culture TEST WILL BE CREDITED TEST CANCELLED PER RN   Final   Report Status 05/10/2015 FINAL  Final  Culture, respiratory (NON-Expectorated)     Status: None   Collection Time: 05/12/15 12:20 AM  Result Value Ref Range Status   Specimen Description BRONCHIAL ALVEOLAR LAVAGE  Final   Special Requests NONE  Final   Gram Stain   Final    MODERATE WBC PRESENT,BOTH PMN AND MONONUCLEAR RARE SQUAMOUS EPITHELIAL CELLS PRESENT NO ORGANISMS SEEN Performed at [facility]    Culture   Final    FEW CANDIDA ALBICANS FEW ACINETOBACTER CALCOACETICUS/BAUMANNII COMPLEX Performed at [facility]    Report Status 05/15/2015 FINAL  Final   Organism ID, Bacteria ACINETOBACTER CALCOACETICUS/BAUMANNII COMPLEX  Final       Susceptibility   Acinetobacter calcoaceticus/baumannii complex - MIC*    AMPICILLIN >=32 RESISTANT Resistant     AMPICILLIN/SULBACTAM 4 SENSITIVE Sensitive     CEFAZOLIN >=64 RESISTANT Resistant     CEFTAZIDIME >=64 RESISTANT Resistant     CEFTRIAXONE >=64 RESISTANT Resistant     CIPROFLOXACIN >=4 RESISTANT Resistant     GENTAMICIN <=1 SENSITIVE Sensitive     IMIPENEM 0.5 SENSITIVE Sensitive     PIP/TAZO >=128 RESISTANT Resistant     TOBRAMYCIN <=1 SENSITIVE Sensitive     TRIMETH/SULFA >=320 RESISTANT Resistant     * FEW ACINETOBACTER CALCOACETICUS/BAUMANNII COMPLEX  Culture, blood (routine x 2)     Status: None   Collection Time: 05/15/15  5:30 PM  Result Value Ref Range Status   Specimen Description BLOOD BLOOD LEFT HAND  Final   Special Requests BOTTLES DRAWN AEROBIC ONLY 1CC  Final   Culture NO GROWTH 5 DAYS  Final   Report Status 05/20/2015 FINAL  Final  Culture, blood (routine x 2)     Status: None   Collection Time: 05/15/15  6:22 PM  Result Value Ref Range Status   Specimen Description BLOOD RIGHT FOOT  Final   Special Requests IN PEDIATRIC BOTTLE 3CC  Final   Culture NO GROWTH 5 DAYS  Final   Report Status 05/20/2015 FINAL  Final  Culture, respiratory (NON-Expectorated)     Status: None   Collection Time: 05/18/15  7:46 PM  Result Value Ref Range Status   Specimen Description TRACHEAL ASPIRATE  Final   Special Requests NONE  Final   Gram Stain   Final    ABUNDANT WBC PRESENT,BOTH PMN AND MONONUCLEAR RARE SQUAMOUS EPITHELIAL CELLS PRESENT MODERATE GRAM NEGATIVE COCCOBACILLI RARE GRAM POSITIVE COCCI IN PAIRS    Culture   Final    ABUNDANT ACINETOBACTER CALCOACETICUS/BAUMANNII COMPLEX Performed at [facility]    Report Status 05/21/2015 FINAL  Final   Organism ID, Bacteria ACINETOBACTER CALCOACETICUS/BAUMANNII COMPLEX  Final      Susceptibility   Acinetobacter calcoaceticus/baumannii complex - MIC*  AMPICILLIN >=32 RESISTANT Resistant      AMPICILLIN/SULBACTAM 4 SENSITIVE Sensitive     CEFAZOLIN >=64 RESISTANT Resistant     CEFTAZIDIME >=64 RESISTANT Resistant     CEFTRIAXONE >=64 RESISTANT Resistant     CIPROFLOXACIN >=4 RESISTANT Resistant     GENTAMICIN 4 SENSITIVE Sensitive     IMIPENEM 0.5 SENSITIVE Sensitive     PIP/TAZO >=128 RESISTANT Resistant     TOBRAMYCIN <=1 SENSITIVE Sensitive     TRIMETH/SULFA >=320 RESISTANT Resistant     * ABUNDANT ACINETOBACTER CALCOACETICUS/BAUMANNII COMPLEX  Culture, Urine     Status: None (Preliminary result)   Collection Time: 05/31/15  9:10 AM  Result Value Ref Range Status   Specimen Description URINE, RANDOM  Final   Special Requests NONE  Final   Culture TOO YOUNG TO READ  Final   Report Status PENDING  Incomplete    Anti-infectives    Start     Dose/Rate Route Frequency Ordered Stop   06/01/15 1100  vancomycin (VANCOCIN) IVPB 1000 mg/200 mL premix     1,000 mg 200 mL/hr over 60 Minutes Intravenous Every 24 hours 06/01/15 0955     05/30/15 1300  imipenem-cilastatin (PRIMAXIN) 250 mg in sodium chloride 0.9 % 100 mL IVPB     250 mg 200 mL/hr over 30 Minutes Intravenous 4 times per day 05/30/15 1245 06/02/15 2359   05/26/15 1431  ceFAZolin (ANCEF) 1-5 GM-% IVPB    Comments:  Hines, Kristopher   : cabinet override      05/26/15 1431 05/27/15 0244   05/25/15 0300  tobramycin (NEBCIN) 160 mg in dextrose 5 % 50 mL IVPB  Status:  Discontinued     160 mg 108 mL/hr over 30 Minutes Intravenous Every 36 hours 05/24/15 1438 06/01/15 0942   05/24/15 1500  gentamicin (GARAMYCIN) 120 mg in dextrose 5 % 50 mL IVPB  Status:  Discontinued     120 mg 106 mL/hr over 30 Minutes Intravenous Every 24 hours 05/23/15 1804 05/24/15 1136   05/23/15 2000  imipenem-cilastatin (PRIMAXIN) 500 mg in sodium chloride 0.9 % 100 mL IVPB  Status:  Discontinued     500 mg 200 mL/hr over 30 Minutes Intravenous Every 8 hours 05/23/15 1804 05/30/15 1245   05/20/15 1300  linezolid (ZYVOX) IVPB 600 mg  Status:   Discontinued     600 mg 300 mL/hr over 60 Minutes Intravenous Every 12 hours 05/20/15 1218 06/01/15 0942   05/20/15 1300  imipenem-cilastatin (PRIMAXIN) 500 mg in sodium chloride 0.9 % 100 mL IVPB  Status:  Discontinued     500 mg 200 mL/hr over 30 Minutes Intravenous 4 times per day 05/20/15 1218 05/23/15 1804   05/20/15 1300  gentamicin (GARAMYCIN) 190 mg in dextrose 5 % 50 mL IVPB  Status:  Discontinued     2.5 mg/kg  75.8 kg 109.5 mL/hr over 30 Minutes Intravenous Every 24 hours 05/20/15 1218 05/23/15 1804   05/18/15 0030  vancomycin (VANCOCIN) IVPB 1000 mg/200 mL premix  Status:  Discontinued     1,000 mg 200 mL/hr over 60 Minutes Intravenous Every 24 hours 05/18/15 0017 05/20/15 1218   05/14/15 0029  vancomycin (VANCOCIN) IVPB 750 mg/150 ml premix  Status:  Discontinued     750 mg 150 mL/hr over 60 Minutes Intravenous Every 24 hours 05/14/15 0029 05/18/15 0017   05/13/15 1000  gentamicin (GARAMYCIN) 520 mg in dextrose 5 % 100 mL IVPB  Status:  Discontinued     7 mg/kg  74.5 kg  113 mL/hr over 60 Minutes Intravenous Every 48 hours 05/12/15 0044 05/17/15 1403   05/12/15 0000  tigecycline (TYGACIL) 50 mg in sodium chloride 0.9 % 100 mL IVPB  Status:  Discontinued     50 mg 200 mL/hr over 30 Minutes Intravenous Every 12 hours 05/11/15 0932 05/20/15 1218   05/11/15 1200  tigecycline (TYGACIL) 100 mg in sodium chloride 0.9 % 100 mL IVPB     100 mg 200 mL/hr over 30 Minutes Intravenous  Once 05/11/15 0932 05/11/15 1443   05/11/15 1015  gentamicin (GARAMYCIN) 520 mg in dextrose 5 % 100 mL IVPB  Status:  Discontinued     7 mg/kg  74.5 kg 113 mL/hr over 60 Minutes Intravenous Every 24 hours 05/11/15 1000 05/12/15 0043   05/09/15 0930  ciprofloxacin (CIPRO) IVPB 400 mg  Status:  Discontinued     400 mg 200 mL/hr over 60 Minutes Intravenous Every 12 hours 05/09/15 0904 05/11/15 0823   05/08/15 0900  aztreonam (AZACTAM) 2 g in dextrose 5 % 50 mL IVPB  Status:  Discontinued     2 g 100  mL/hr over 30 Minutes Intravenous 3 times per day 05/08/15 0826 05/11/15 0823   05/06/15 0000  vancomycin (VANCOCIN) IVPB 750 mg/150 ml premix  Status:  Discontinued     750 mg 150 mL/hr over 60 Minutes Intravenous Every 48 hours 05/05/15 2357 05/14/15 0030   04/28/15 2000  vancomycin (VANCOCIN) IVPB 750 mg/150 ml premix  Status:  Discontinued     750 mg 150 mL/hr over 60 Minutes Intravenous Every 24 hours 04/28/15 0731 05/04/15 1932   04/26/15 0800  vancomycin (VANCOCIN) IVPB 750 mg/150 ml premix  Status:  Discontinued     750 mg 150 mL/hr over 60 Minutes Intravenous Every 12 hours 04/25/15 1715 04/28/15 0731   04/26/15 0200  aztreonam (AZACTAM) 1 g in dextrose 5 % 50 mL IVPB  Status:  Discontinued     1 g 100 mL/hr over 30 Minutes Intravenous Every 8 hours 04/25/15 1919 05/07/15 1035   04/25/15 1715  vancomycin (VANCOCIN) IVPB 750 mg/150 ml premix     750 mg 150 mL/hr over 60 Minutes Intravenous STAT 04/25/15 1715 04/25/15 1906   04/25/15 1645  aztreonam (AZACTAM) 2 g in dextrose 5 % 50 mL IVPB     2 g 100 mL/hr over 30 Minutes Intravenous  Once 04/25/15 1643 04/25/15 1807      Assessment: 29 yo m with complicated infectious diseases course. Patient is wheelchair bound with multiple medical problems.  Day # 13/14 of abx for VAP. Continuing imipenem and tobramycin for 14 days per ID.   Day # 37/63 of abx for MRSA/microaerophilic strep bacteremia. To switch back to vancomycin on 8/24 per CCM. Was slightly subtherapeutic on vancomycin 750mg  IV Q24 so will restart him on 1g IV Q24 and check trough at steady state. ID recommended continuing treatment until 9/18.  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     Pt is currently afebrile and WBC down to 12.5. TEE on 7/28 was negative for vegetations  Goal of Therapy:  Vancomycin trough level 15-20 mcg/ml  Resolution of infection  Plan:  Continue imipenem to 250mg  IV q6h (stop  date 8/25) Last dose of Tobramycin treatment was on 8/23 Stop Linezolid Restart vancomycin at 1g IV Q24 today Monitor clinical picture, renal function, CBC, VT at Css  Jason [name] J 06/01/2015,9:55 AM

## 2015-06-01 NOTE — Progress Notes (Signed)
Patient's father placed him on his home ventilator twice. He competently removed the PMV, inflated the cuff, suctioned the patient, and placed him on his home ventilator. No oxygen is flowing into the ventilator and the patient's vitals are remaining stable with a SpO2 of 100%, HR of 88 and respirations of 16. RT will continue to monitor.

## 2015-06-01 NOTE — Progress Notes (Signed)
PULMONARY / CRITICAL CARE MEDICINE   Name: Jason [name] MRN: [id number] DOB: [birth year]    ADMISSION DATE: 04/25/2015 TODAY'S DATE: 05/10/2015  REFERRING MD : TRH  CHIEF COMPLAINT: Hypotension  INITIAL PRESENTATION: 29 year old male quad with MRSA bacteremia from multiple sources. Concern for endocarditis. Wounds have been checked and followed by general surgery and ortho. Patient has history of hypotension and concern for septic shock and levophed was started and PCCM consulted. 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 ?  HCAP.   SIGNIFICANT EVENTS: 7/28 >> OR for debridement of osteomyelitis and abscess cx with staph. Positive MRSA and strep blood cx 7/28 >> Reconsulted for extensive post op bleeding and hypotension 7/31 >> worsening respiratory status, acidosis, hypoxemia, hypotension overnight 8/18  IR gastrostomy tube, mod sedation  STUDIES: 7/28 TEE >> NO vegetation, normal LV function  7/31 Bronch >> HCAP, extensive mucous plug 8/2 bronch - no sig obstruction 8/2- family does not want trach at this point even though is recommended 8/3 bronch lungs open, clear, extubated, re intubated lat evening due to collapse 8/4- pt changed decision, trach placed 8/8 weaned 10+ hours 05/22/15: nad on vent/ no new concerns per nursing/ ID note reviewed and strongly rec palliative care eval  8/18 home health DMEs evaluated home (OK for home vent). Got PEG tube done in IR. Holding chest PT d/t PEG and discomfort.  8/19 Tubefeeds resumed.  8/22 failed swallow eval 8/23 Mother completed overnight vent training. suprapub intermittently leaking. Kayexalate given for hyperkalemia. Fentanyl patch increased to 156mcg/hr as requiring freq PRN doses   SUBJECTIVE/OVERNIGHT/INTERVAL HX No distress.   VITAL SIGNS: Temp:  [97.2 F (36.2 C)-98.9 F (37.2 C)] 98.9 F (37.2 C) (08/24 0852) Pulse Rate:  [82-108] 89 (08/24 0832) Resp:  [16-29] 28  (08/24 0832) BP: (94-116)/(56-78) 102/65 mmHg (08/24 0852) SpO2:  [96 %-100 %] 100 % (08/24 0852) FiO2 (%):  [21 %] 21 % (08/24 0832) Weight:  [50.349 kg (111 lb)] 50.349 kg (111 lb) (08/24 0500) HEMODYNAMICS:   VENTILATOR SETTINGS: Vent Mode:  [-] AC FiO2 (%):  [21 %] 21 % Set Rate:  [16 bmp] 16 bmp Vt Set:  [570 mL] 570 mL PEEP:  [5 cmH20] 5 cmH20 INTAKE / OUTPUT:  Intake/Output Summary (Last 24 hours) at 06/01/15 0937 Last data filed at 06/01/15 0526  Gross per 24 hour  Intake   1733 ml  Output   3300 ml  Net  -1567 ml    PHYSICAL EXAMINATION:  Gen: chronically ill appearing, NAD on ATC, no distress  HENT: trach clean, dry, good phonation through PMV PULM: resps even non labored on ATC, diminished bases, occ rhonchi  CV: RRR, no mgr, trace edema arms, feet GI: BS+, soft, nontender. PEG unremarkable  Derm:  sacral wound stage 4 plus Neuro:  Awake, alert, nods approp   LABS:  CBC  Recent Labs Lab 05/26/15 0425 05/30/15 0521  HGB 8.2* 8.9*  HCT 25.4* 27.5*  WBC 14.5* 12.5*  PLT 602* 699*   CHEMISTRY  Recent Labs Lab 05/27/15 0223 05/28/15 0300 05/29/15 0313 05/30/15 0521 05/31/15 0316 06/01/15 0454  NA 136  --   --  133* 135 140  K 4.9  --   --  5.2* 5.6* 4.3  CL 107  --   --  105 107 111  CO2 19*  --   --  20* 20* 20*  GLUCOSE 172*  --   --  249* 245*  41*  BUN 8  --   --  15 17 23*  CREATININE 0.53*  --   --  0.46* 0.48* 0.69  CALCIUM 8.0*  --   --  8.2* 8.7* 8.8*  MG 2.2 1.7 1.6* 1.3* 1.7 1.9   Estimated Creatinine Clearance: 96.1 mL/min (by C-G formula based on Cr of 0.69).   ENDOCRINE CBG (last 3)   Recent Labs  06/01/15 0511 06/01/15 0640 06/01/15 0734  GLUCAP 106* 101* 155*      IMAGING x48h  - image(s) personally visualized  -   highlighted in bold Dg Swallowing Func-speech Pathology  05/30/2015    Objective Swallowing Evaluation:   Modified Barium Swallow Patient Details  Name: Jason [name] MRN: [id number] Date of Birth:  07/01/1986  Today's Date: 05/30/2015 Time: SLP Start Time (ACUTE ONLY): 1035-SLP Stop Time (ACUTE ONLY): 1051 SLP Time Calculation (min) (ACUTE ONLY): 16 min  Past Medical History:  Past Medical History  Diagnosis Date  . GERD (gastroesophageal reflux disease)   . Asthma   . Hx MRSA infection     on face  . Gastroparesis   . Diabetic neuropathy   . Seizures   . Family history of anesthesia complication     Pt mother can't have epidural procedures  . Dysrhythmia   . Pneumonia   . Arthritis   . Fibromyalgia   . 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 Type I diabetes mellitus     sees Dr. Loanne Drilling   . Syncope 02/16/2015   Past Surgical History:  Past Surgical History  Procedure Laterality Date  . Tonsillectomy    . Multiple extractions with alveoloplasty N/A 08/03/2014    Procedure: MULTIPLE EXTRACTIONS;  Surgeon: Gae Bon, DDS;   Location: [place];  Service: Oral Surgery;  Laterality: N/A;  . Tee without cardioversion N/A 08/17/2014    Procedure: TRANSESOPHAGEAL ECHOCARDIOGRAM (TEE);  Surgeon: Dorothy Spark, MD;  Location[facility] ENDOSCOPY;  Service: Cardiovascular;   Laterality: N/A;  . Debridment of decubitus ulcer N/A 10/04/2014    Procedure: DEBRIDMENT OF DECUBITUS ULCER;  Surgeon: Georganna Skeans, MD;   Location: [place];  Service: General;  Laterality: N/A;  . Laparoscopic diverted colostomy N/A 10/12/2014    Procedure: LAPAROSCOPIC DIVERTING COLOSTOMY;  Surgeon: Donnie Mesa,  MD;  Location: [place];  Service: General;  Laterality: N/A;  . Insertion of suprapubic catheter N/A 10/12/2014    Procedure: INSERTION OF SUPRAPUBIC CATHETER;  Surgeon: Reece Packer, MD;  Location: [place];  Service: Urology;  Laterality: N/A;  . Incision and drainage of wound N/A 11/12/2014    Procedure: IRRIGATION AND DEBRIDEMENT OF WOUNDS WITH BONE BIOPSY AND  SURGICAL PREP ;  Surgeon: Theodoro Kos, DO;  Location: [place];  Service:  Plastics;  Laterality: N/A;  . Application of a-cell of back N/A 11/12/2014    Procedure:  APPLICATION A CELL AND VAC ;  Surgeon: Theodoro Kos, DO;   Location: [place];  Service: Plastics;  Laterality: N/A;  . Incision and drainage of wound N/A 11/18/2014    Procedure: IRRIGATION AND DEBRIDEMENT OF SACRAL ULCER ONLY WITH  PLACEMENT OF A CELL AND VAC/ DRESSING CHANGE TO UPPER BACK AREA.;   Surgeon: Theodoro Kos, DO;  Location: [place];  Service: Plastics;   Laterality: N/A;  . Incision and drainage of wound N/A 11/25/2014    Procedure: IRRIGATION AND DEBRIDEMENT OF SACRAL ULCER AND BACK BURN WITH  PLACEMENT OF A-CELL;  Surgeon:  Claire Sanger, DO;  Location: [place];   Service: Clinical cytogeneticist;  Laterality: N/A;  . Minor application of wound vac N/A 11/25/2014    Procedure:  WOUND VAC CHANGE;  Surgeon: Theodoro Kos, DO;  Location: [place];  Service: Plastics;  Laterality: N/A;  . Incision and drainage of wound Right 12/08/2014    Procedure: IRRIGATION AND DEBRIDEMENT SACRAL WOUND AND RIGHT ISCHIAL  WOUND ;  Surgeon: Theodoro Kos, DO;  Location: [place];  Service: Plastics;   Laterality: Right;  . Application of a-cell of back N/A 12/08/2014    Procedure: APPLICATION OF A-CELL AND WOUND VAC ;  Surgeon: Theodoro Kos, DO;  Location: [place];  Service: Plastics;  Laterality: N/A;  . Tee without cardioversion N/A 05/05/2015    Procedure: TRANSESOPHAGEAL ECHOCARDIOGRAM (TEE);  Surgeon: Lelon Perla, MD;  Location: [place];  Service: Cardiovascular;   Laterality: N/A;  . Incision and drainage of wound Bilateral 05/05/2015    Procedure: IRRIGATION AND DEBRIDEMENT SACRAL ULCER;  Surgeon: Theodoro Kos, DO;  Location: [place];  Service: Plastics;  Laterality: Bilateral;  . Hematoma evacuation N/A 05/05/2015    Procedure: EVACUATION HEMATOMA bedside procedure;  Surgeon: Theodoro Kos, DO;  Location: [place];  Service: Plastics;  Laterality: N/A;   HPI:  Other Pertinent Information: 29 year old male quad with MRSA bacteremia  from multiple sources. Concern for endocarditis.Patient has history of  hypotension and concern for septic  shock. Source of shock was identified  as osteomyelitis of sacral decub, and it was debrided. Intubated 7/31-8/3,  reintubated 8/3 with trach 8/4. NPO recommended following FEES 8/16,  received PEG 8/18. Repeat objective assessment with MBS (should tolerate  better) for possible pleasure po.  No Data Recorded  Assessment / Plan / Recommendation CHL IP CLINICAL IMPRESSIONS 05/30/2015  Therapy Diagnosis Mild oral phase dysphagia;Moderate pharyngeal phase  dysphagia  Clinical Impression Pt exhibited mild oral dysphagia with delayed  anterior-posterior transit. Moderate sensorimotor pharyngeal phase  dysphagia with decreased sensation resulting in delayed swallow initiation  to valleculae. Silent laryngeal penetration to vocal cords with nectar and  honey and into vestibule with puree (silent). Reduced tongue base  retraction led to moderate vallecular residue and decreased laryngeal  elevation resulting in mild-moderate pyriform sinus residue. Verbal cues  for multiple swallows and throat clear were ineffective (SLP assist for  quad cough) to clear penetrates or decrease residue. Aspiration risk high  currently, however prognosis for po's is good.          CHL IP TREATMENT RECOMMENDATION 05/30/2015  Treatment Recommendations Therapy as outlined in treatment plan below     CHL IP DIET RECOMMENDATION 05/30/2015  SLP Diet Recommendations NPO  Liquid Administration via (None)  Medication Administration Via alternative means  Compensations (None)  Postural Changes and/or Swallow Maneuvers (None)     CHL IP OTHER RECOMMENDATIONS 05/30/2015  Recommended Consults (None)  Oral Care Recommendations Oral care QID  Other Recommendations (None)     CHL IP FOLLOW UP RECOMMENDATIONS 05/27/2015  Follow up Recommendations 24 hour supervision/assistance     CHL IP FREQUENCY AND DURATION 05/30/2015  Speech Therapy Frequency (ACUTE ONLY) min 2x/week  Treatment Duration 2 weeks     Pertinent Vitals/Pain none    SLP Swallow Goals No flowsheet  data found.  No flowsheet data found.    CHL IP REASON FOR REFERRAL 05/30/2015  Reason for Referral Objectively evaluate swallowing function  No flowsheet data found.         [name] 05/30/2015, 11:39 AM  Orbie Pyo Colvin Caroli.Ed Engineer, agricultural [phone]       ASSESSMENT / PLAN:  Acute hypoxemia respiratory failure > due to pulm edema vs HCAP with atelectasis  Pleural effusion R base, small Collapse rt base RML, RLL> improved post bronch   Trach 8/5 >> Plan:  Continue daytime ATC as tolerated with mandatory qhs vent  Chest PT In-line PMV   Autonomic dysfunction w/ associated hypotension  Plan:  Continue Midodrine 20mg  PO TID  Hyperkalemia -->resolved Plan F/u as needed   Hypomagnesemia  Plan: Improved monitor as needed   intermittent Leaking supra-pubic foley; now s/p new suprapubic cath placed 8/17. Leaking again on 8/23-->mother says fairly common occurrence  Plan:  F/u  UA and culture    Severe malnutrition Dysphagia - failed FEES 8/16 Plan:  Zofran PRN nausea PPI  PEG placed 8/18: started tube feeds  (8/19)  Anemia - due to blood loss from sacral wound and severe peri op bleeding from debridement, dilution >> Transfused 1 unit pRBC 8/12 DVT prevention Leukocytosis -decreasing May 2016 PE IVC filter placed 8/6 Plan:  Trend CBC in am  SCD Monitor for bleeding Will not resume NOAC   MRSA bacteremia w/ neg TEE, likely osteomyelitis source E coli UTI treated MDR acinatobacter HCAP Vanc 7/18 >   8/11  Aztreonam 7/18>>>8/3 8/1 cipro>>>8/3 Plan:  Will switch to vanc today    T1 DM Plan:  SSI  Quad post trauma Post op pain  Acute encephalopathy 7/31 > improved/resolved  Plan:  Change fent patch to a higher dose remeron qhs   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 Likely wednesday or Thursday   Erick Colace ACNP-BC [place] Pager # [phone] OR # [phone] if no answer  Attending Note:

## 2015-06-02 ENCOUNTER — Telehealth: Payer: Self-pay

## 2015-06-02 ENCOUNTER — Telehealth: Payer: Self-pay | Admitting: Physical Medicine & Rehabilitation

## 2015-06-02 ENCOUNTER — Telehealth: Payer: Self-pay | Admitting: Internal Medicine

## 2015-06-02 DIAGNOSIS — L89159 Pressure ulcer of sacral region, unspecified stage: Secondary | ICD-10-CM

## 2015-06-02 DIAGNOSIS — M869 Osteomyelitis, unspecified: Secondary | ICD-10-CM

## 2015-06-02 LAB — URINE CULTURE: Culture: >=100000

## 2015-06-02 LAB — GLUCOSE, CAPILLARY
GLUCOSE-CAPILLARY: 169 mg/dL — AB (ref 65–99)
GLUCOSE-CAPILLARY: 246 mg/dL — AB (ref 65–99)
GLUCOSE-CAPILLARY: 83 mg/dL (ref 65–99)
Glucose-Capillary: 150 mg/dL — ABNORMAL HIGH (ref 65–99)

## 2015-06-02 NOTE — Telephone Encounter (Signed)
Called [facility]-spoke with Butch Penny - states that Claritin 10mg  Rx was sent to pharmacy without a qty or refills.  The medication is to be taken via feeding tube.  Pt was seen by Dr Nelda Marseille, Dr Ashok Cordia, Dr Elsworth Soho, Dr Chase Caller and Marni Griffon NP during his stay will in [facility].  Pt is not followed by or established with one of our Pulmonologist's currently but has a HFU scheduled for 9/23 with MR. Claritin 10mg  to be sent to pharmacy with #30 x zero refills to hold patient until follow up with MR Med was sent as OTC rather than normal Rx, will call pharmacy to make aware of this.   Called back to speak with Butch Penny at [place] can be given for Claritin 10mg  to be filled #30 with 0RF at this time.  LMTCB x 1 on pharmacy voicemail - no answer from pharmacy staff when returned call.

## 2015-06-02 NOTE — Telephone Encounter (Signed)
Needs clarification on medication since he has been out of hospital.  Please call mother Treasa [employer] at [phone]

## 2015-06-02 NOTE — Telephone Encounter (Signed)
Stryker is out of the hospital. He was discharged with Rx's for Fentanyl 175mcg/hr and oxycodone 10 mg.  The issuing prescriber is Salvadore Dom, NP.  The mother is having concerns and would like to speak to you about Madison's medications.  He is having to endure dressing changes 3 times a day and the 10 mg oxycodone is not providing sufficient relief.  The mother states that if he takes 20 mg, he is able to tolerate the dressing changes with minimal pain.

## 2015-06-02 NOTE — Care Management Note (Signed)
Case Management Note  Patient Details  Name: Jason [name] MRN: [id number] Date of Birth: 09/26/1986  Subjective/Objective:     Pt discharged to home on vent with PICC for IV antibx.  [facility] will provide private duty nsg services, [facility] are providing RT and nocturnal vent.                       Expected Discharge Plan:  [place]  Discharge planning Services  CM Consult  Post Acute Care Choice:  Home Health Choice offered to:  Parent  DME Arranged:  Trach supplies, Ventilator, Oxygen DME Agency:  [facility]  HH Arranged:  RN [place] Agency:  [facility]  Status of Service:  Completed, signed off   Girard Cooter, [place] 06/02/2015, 8:50 AM

## 2015-06-02 NOTE — Telephone Encounter (Signed)
The pt's family member Estate agent) called and is hoping to get the patient an anxiety medication called in.  She states since being home from the hospital, the pt has been overly anxious  Lyla's callback - [phone]

## 2015-06-03 ENCOUNTER — Telehealth: Payer: Self-pay | Admitting: Internal Medicine

## 2015-06-03 ENCOUNTER — Telehealth: Payer: Self-pay | Admitting: *Deleted

## 2015-06-03 MED ORDER — LORAZEPAM 1 MG PO TABS: 1.0000 mg | ORAL_TABLET | Freq: Four times a day (QID) | ORAL | Status: DC | PRN

## 2015-06-03 MED ORDER — OXYCODONE HCL 20 MG PO TABS: 20.0000 mg | ORAL_TABLET | Freq: Three times a day (TID) | ORAL | Status: DC | PRN

## 2015-06-03 MED ORDER — FENTANYL 75 MCG/HR TD PT72: 75.0000 ug | MEDICATED_PATCH | TRANSDERMAL | Status: DC

## 2015-06-03 NOTE — Telephone Encounter (Signed)
PLEASE NOTE: All timestamps contained within this report are represented as [place] Standard Time. CONFIDENTIALTY NOTICE: This fax transmission is intended only for the addressee. It contains information that is legally privileged, confidential or otherwise protected from use or disclosure. If you are not the intended recipient, you are strictly prohibited from reviewing, disclosing, copying using or disseminating any of this information or taking any action in reliance on or regarding this information. If you have received this fax in error, please notify us immediately by telephone so that we can arrange for its return to [name]. Phone: [phone], Toll-Free: [phone], Fax: [phone] Page: 1 of 2 Call Id: [id number] [facility] Night - Client [place] Patient Name: Jason [name] Gender: Male DOB: [birth year] Age: 29 Y 10 M 24 D Return Phone Number: [phone] (Primary) Address: City/State/Zip: [place] [address] Client [facility] Night - Client Client Site [facility] - Night Physician [place], [place] Type Call Call Type Triage / Clinical Caller Name Richarda Osmond Relationship To Patient Other Return Phone Number [phone] (Primary) Chief Complaint BLOOD SUGAR HIGH - 500 or greater Initial Comment Richarda Osmond a nurse with [facility] needs to speak with the on-call. Call back # is [phone]. Pt. is Type 1 diabetic and his blood sugar level is 508. PreDisposition Call Doctor Nurse Assessment Nurse: Tamala Julian, RN, Ronalee Belts Date/Time Eilene Ghazi Time): 06/02/2015 3:49:39 AM Confirm and document reason for call. If symptomatic, describe symptoms. ---Caller states pt d/c home today from hospital following tx for sepsis . DM and has peg tube. bs at 8 was 222. now 58. gets novalog with meals and 5 u lantis at hs. no vomiting caller not sure of rectal temp. asked  her to check and will call her back. temp 97.1 ax ( pt refused rectal) . Has the patient traveled out of the country within the last 30 days? ---No Does the patient require triage? ---Yes Related visit to physician within the last 2 weeks? ---Yes Does the PT have any chronic conditions? (i.e. diabetes, asthma, etc.) ---Yes Guidelines Guideline Title Affirmed Question Affirmed Notes Nurse Date/Time (Eastern Time) Diabetes - High Blood Sugar Blood glucose > 500 mg/ dl (27.5 mmol/l) Tamala Julian, RN, Ronalee Belts 06/02/2015 4:56:55 AM Disp. Time Eilene Ghazi Time) Disposition Final User 06/02/2015 3:33:39 AM Send to Urgent Queue Zannie Kehr 06/02/2015 3:59:36 AM Paged On Call back to [facility], RNRonalee Belts 06/02/2015 4:27:20 AM Paged On Call back to [facility], [place], Ronalee Belts 06/02/2015 4:57:25 AM Go to ED Now (or PCP triage) Yes Tamala Julian, RN, Natasha Mead NOTE: All timestamps contained within this report are represented as [place] Standard Time. CONFIDENTIALTY NOTICE: This fax transmission is intended only for the addressee. It contains information that is legally privileged, confidential or otherwise protected from use or disclosure. If you are not the intended recipient, you are strictly prohibited from reviewing, disclosing, copying using or disseminating any of this information or taking any action in reliance on or regarding this information. If you have received this fax in error, please notify us immediately by telephone so that we can arrange for its return to [name]. Phone: [phone], Toll-Free: [phone], Fax: [phone] Page: 2 of 2 Call Id: [id number] Caller Understands: Yes Disagree/Comply: Comply Care Advice Given Per Guideline GO TO ED NOW (OR PCP TRIAGE): DRIVING: Another adult should drive. BRING MEDICINES: * Please bring a list of your current medicines when you go to see the doctor. CARE ADVICE given  per Diabetes - High Blood Sugar (Adult) guideline. After Care Instructions  Given Call Event Type User Date / Time Description Comments User: Conley Simmonds, RN Date/Time Eilene Ghazi Time): 06/02/2015 4:58:41 AM unable to contact on call. BS went from 505 to 563 during the hr. followed triage and referred back to ER. Referrals [place] [facility] Paging DoctorName Phone DateTime Result/Outcome Message Type Notes Scarlette Calico [phone] 06/02/2015 3:59:36 AM Paged On Call Back to Call Center Doctor Paged Please call Cristal Deer at [place]. re [facility] pt Dr [facility] 06/02/2015 4:26:01 AM Unable to Reach on call - Max Attempts Message Result Scarlette Calico [phone] 06/02/2015 4:27:20 AM Called On Call Provider - Left Message Doctor Paged Scarlette Calico 06/02/2015 4:27:56 AM Unable to Reach on call - Max Attempts Message Result tried mobile phone went directly to voice mail.

## 2015-06-03 NOTE — Telephone Encounter (Signed)
I called in script and spoke with mom.

## 2015-06-03 NOTE — Telephone Encounter (Signed)
I spoke with pt's mom and Dr. Loanne Drilling has recommended a sliding scale for [facility] to follow. Can we send in a script now to help with pt's anxiety?

## 2015-06-03 NOTE — Telephone Encounter (Signed)
See the today's phone note

## 2015-06-03 NOTE — Telephone Encounter (Signed)
I left another voice message with below information and also advised mom to return my call.

## 2015-06-03 NOTE — Telephone Encounter (Signed)
Call in Ativan 1 mg to take every 6 hours prn anxiety, #120 with 2 rf

## 2015-06-03 NOTE — Telephone Encounter (Signed)
i spoke with mother. Will write for the 20mg  OXY IR but will only do so if we reduce the fentanyl to 50mcg---will write for one box of the 67mcg. If he does ok, would like to reduce further to 88mcg in 2 weeks. rx'es written. I asked mother to be extremely vigilant for sedation and respiratory depression with the doses of medication he's receiving

## 2015-06-03 NOTE — Telephone Encounter (Signed)
Nothing further needed per phone note 8.26.16. Will sign off.

## 2015-06-03 NOTE — Telephone Encounter (Signed)
I am happy to call in a med for anxiety, but it sounds like he needs to be back in the hospital with his out of control glucoses. Where is he right now?

## 2015-06-03 NOTE — Telephone Encounter (Signed)
I left a voice message for mom to return my call.

## 2015-06-03 NOTE — Telephone Encounter (Signed)
Spoke with Gerald Stabs at pharmacy and given quantity of 30 for Loratidine with 6 refills.

## 2015-06-04 ENCOUNTER — Other Ambulatory Visit: Payer: Self-pay | Admitting: Family Medicine

## 2015-06-06 LAB — GLUCOSE, CAPILLARY: GLUCOSE-CAPILLARY: 35 mg/dL — AB (ref 65–99)

## 2015-06-08 ENCOUNTER — Telehealth: Payer: Self-pay | Admitting: Family Medicine

## 2015-06-08 NOTE — Telephone Encounter (Signed)
Increase Midodrine to 3 tabs (30 mg) TID. Call in refills if needed

## 2015-06-08 NOTE — Telephone Encounter (Signed)
Jason [name] is calling from [facility] his midodrine for bp is not effective. Pt bp is 70/43 last reading. Pharm [facility] on  [address]

## 2015-06-08 NOTE — Telephone Encounter (Signed)
I spoke with Tameka from home care. I gave below instructions for increasing medication, pt just picked up script. Beau Fanny will notify family of changes.

## 2015-06-09 DIAGNOSIS — J9690 Respiratory failure, unspecified, unspecified whether with hypoxia or hypercapnia: Secondary | ICD-10-CM | POA: Diagnosis not present

## 2015-06-10 ENCOUNTER — Telehealth: Payer: Self-pay

## 2015-06-10 ENCOUNTER — Telehealth: Payer: Self-pay | Admitting: Family Medicine

## 2015-06-10 NOTE — Telephone Encounter (Signed)
I contacted the nurse back and advised of note below. She spoke with the mom and declined to make a office visit at this time due to a open wound the pt has currently. Pt will make a appointment when he is physically able to come.

## 2015-06-10 NOTE — Telephone Encounter (Signed)
Nurse with [facility] called to advise on blood sugar.  06/09/2015: 4 pm: 180 10 pm: 397  06/10/2015:  3 am: 486 4 pm: 445  Nurse stated the pt has been using 2 units of novolog per sidling scale because sugar is over 400 and 15 units of Lantus.

## 2015-06-10 NOTE — Telephone Encounter (Signed)
Pt's nurse called and stated that pt's blood sugar have been elevated, around 300 or so. Pt see's Dr. Loanne Drilling for his diabetes and Dr. Sarajane Jews recommended the nurse contact that office for further advice. I spoke with Tameka and gave this message. She will contact Dr. Loanne Drilling.

## 2015-06-10 NOTE — Telephone Encounter (Signed)
Pt is long overdue for f/u appt Ov next available

## 2015-06-15 ENCOUNTER — Telehealth: Payer: Self-pay | Admitting: *Deleted

## 2015-06-15 DIAGNOSIS — IMO0002 Reserved for concepts with insufficient information to code with codable children: Secondary | ICD-10-CM

## 2015-06-15 DIAGNOSIS — G9511 Acute infarction of spinal cord (embolic) (nonembolic): Secondary | ICD-10-CM

## 2015-06-15 DIAGNOSIS — E1040 Type 1 diabetes mellitus with diabetic neuropathy, unspecified: Secondary | ICD-10-CM

## 2015-06-15 DIAGNOSIS — E1065 Type 1 diabetes mellitus with hyperglycemia: Secondary | ICD-10-CM

## 2015-06-15 DIAGNOSIS — L89159 Pressure ulcer of sacral region, unspecified stage: Secondary | ICD-10-CM

## 2015-06-15 DIAGNOSIS — G8250 Quadriplegia, unspecified: Secondary | ICD-10-CM

## 2015-06-15 MED ORDER — BACLOFEN 10 MG PO TABS: 10.0000 mg | ORAL_TABLET | Freq: Three times a day (TID) | ORAL | Status: DC

## 2015-06-15 NOTE — Telephone Encounter (Signed)
rx written ad sent to pharmacy

## 2015-06-15 NOTE — Telephone Encounter (Signed)
Pt mother called and stated that pt was needing another RX written for Glucagon. Pt has a RX written for 1 kit. Pt mother states that he needs 3 kits ( 1 for traveling, 1 for home and a back up ). [facility] pharmacy [facility].  Pt mother states that when he was D/C from the hospital the MD at the hospital wanted him to have an IV Glucagon then Home Health RN would administer that IV Glucagon. Pt mother states she has not heard anything about that from [facility] Pt mother is requesting a call back [phone]

## 2015-06-15 NOTE — Telephone Encounter (Signed)
I left a voice message on mom's number with below information and updated medication list. Advised to call diabetic doctor for Glucagon script.

## 2015-06-15 NOTE — Telephone Encounter (Signed)
Instead of changing, tell her to cut the Lorazepam in half (to make it 0.5 mg) and to use this no more than TID (every 8 hours)

## 2015-06-15 NOTE — Telephone Encounter (Signed)
Mrs Dutton called to advise pt is having adverse effects from ativan. Hallucinations, seeing,talking to people not there. Would like to know if you can switch to clonazepam

## 2015-06-15 NOTE — Telephone Encounter (Signed)
Pt's mother says they went to [facility] to get a refill on baclofen and were told that the Rx had expired.  The last time they filled this Rx was 11/29/2014.  How would you like to proceed.

## 2015-06-16 ENCOUNTER — Telehealth: Payer: Self-pay | Admitting: Internal Medicine

## 2015-06-16 ENCOUNTER — Telehealth: Payer: Self-pay | Admitting: *Deleted

## 2015-06-16 NOTE — Telephone Encounter (Signed)
Pt's family called and notified

## 2015-06-16 NOTE — Telephone Encounter (Signed)
Blood cultures 04/28/2015: MRSA Pelvic abscess cultures 05/05/2015: MRSA  MRI of pelvis 04/28/2015 IMPRESSION: 1. Right internal obturator abscess, appearing to extend from the right ischial tuberosity decubitus ulcer. 2. Persistent abnormal signal from the right inferior pubic ramus remnants extending into the posterior aspect of the right acetabulum. 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  Electronically Signed  By: Lorriane Shire M.D.  On: 04/28/2015 12:34  Vancomycin level 06/09/2015: 39.8 06/14/2015: 16.9 Creatinine 06/09/2015: 1.14  06/14/2015: 1.26  Mr. 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 He had a pelvic abscess and MRSA bacteremia. He was also treated for HCAP during that admission. He was seen by my partner, Dr. Drucilla Schmidt, during that admission. He was discharged on IV vancomycin with the plan to complete 6 weeks of total therapy on 06/26/2015.   I received a phone call from Dr. Tamera Punt this morning informing me that his vancomycin had been held and that he had a temperature of 102 this morning. I then called Jeani Hawking, [facility], who informed me that his vancomycin trough had been elevated recently and that his creatinine was starting to rise.  I called and spoke to his home nurse, Hildred Alamin, who told me that he has had intermittent odor noted from his sacral wound since discharge but overall that there was no significant change. She informed me that he is clinically stable. He is on the ventilator at night but has not had increase secretions noted. He has not had any diarrhea.  Dr. Reynaldo Minium prescribed levofloxacin. I ordered repeat BMET and random vancomycin level today. I also ordered urine and blood cultures. I asked for her preliminary results to be phoned to me tomorrow so I can make a decision about whether or not vancomycin needs to be  restarted or he needs to be changed to an alternate regimen. His mother had requested that he be started back on aztreonam which she felt he responded well to earlier this year. However, aztreonam is not a good substitute for MRSA coverage.  My nurse called and spoke with his mother today. He was offered a clinic follow-up visit next week but she did not feel that she could get him here. He was given a visit with Dr. Tommy Medal for later in the month when she had a nurse available to accompany him to the visit. He has a follow-up visit at the wound care center on Monday, 06/20/2015.

## 2015-06-16 NOTE — Telephone Encounter (Signed)
Spoke with the pt's mother to arrange follow-up appt.  [facility] MD appt, 07/05/15 @ 1030 w/ Dr Tommy Medal.  Notified [facility] of [facility] appt.

## 2015-06-17 ENCOUNTER — Telehealth: Payer: Self-pay | Admitting: Internal Medicine

## 2015-06-17 NOTE — Telephone Encounter (Signed)
I received a phone call this morning from Jason [name]'s home nurse, Hildred Alamin. She informed me that he had not had any further fever since yesterday. His creatinine was down to 1.15 and his random vancomycin level was down to 6.9. Urine and blood cultures are pending. I gave orders for them to resume IV vancomycin adjusted according to protocol and continue through 06/26/2015 as previously planned.

## 2015-06-17 NOTE — Telephone Encounter (Signed)
I tried to reach mom again and voice mail came on.

## 2015-06-18 ENCOUNTER — Other Ambulatory Visit: Payer: Self-pay | Admitting: Family Medicine

## 2015-06-20 ENCOUNTER — Telehealth: Payer: Self-pay | Admitting: Family Medicine

## 2015-06-20 ENCOUNTER — Encounter (HOSPITAL_BASED_OUTPATIENT_CLINIC_OR_DEPARTMENT_OTHER): Payer: Medicaid Other | Attending: Plastic Surgery

## 2015-06-20 DIAGNOSIS — E109 Type 1 diabetes mellitus without complications: Secondary | ICD-10-CM | POA: Insufficient documentation

## 2015-06-20 DIAGNOSIS — E1040 Type 1 diabetes mellitus with diabetic neuropathy, unspecified: Secondary | ICD-10-CM | POA: Insufficient documentation

## 2015-06-20 DIAGNOSIS — M1990 Unspecified osteoarthritis, unspecified site: Secondary | ICD-10-CM | POA: Insufficient documentation

## 2015-06-20 DIAGNOSIS — M797 Fibromyalgia: Secondary | ICD-10-CM | POA: Insufficient documentation

## 2015-06-20 DIAGNOSIS — Z930 Tracheostomy status: Secondary | ICD-10-CM | POA: Insufficient documentation

## 2015-06-20 DIAGNOSIS — G40909 Epilepsy, unspecified, not intractable, without status epilepticus: Secondary | ICD-10-CM | POA: Insufficient documentation

## 2015-06-20 DIAGNOSIS — Z933 Colostomy status: Secondary | ICD-10-CM | POA: Insufficient documentation

## 2015-06-20 DIAGNOSIS — Z794 Long term (current) use of insulin: Secondary | ICD-10-CM | POA: Insufficient documentation

## 2015-06-20 DIAGNOSIS — R634 Abnormal weight loss: Secondary | ICD-10-CM | POA: Insufficient documentation

## 2015-06-20 DIAGNOSIS — Z8614 Personal history of Methicillin resistant Staphylococcus aureus infection: Secondary | ICD-10-CM | POA: Insufficient documentation

## 2015-06-20 DIAGNOSIS — K219 Gastro-esophageal reflux disease without esophagitis: Secondary | ICD-10-CM | POA: Insufficient documentation

## 2015-06-20 DIAGNOSIS — K3184 Gastroparesis: Secondary | ICD-10-CM | POA: Insufficient documentation

## 2015-06-20 DIAGNOSIS — L89622 Pressure ulcer of left heel, stage 2: Secondary | ICD-10-CM | POA: Insufficient documentation

## 2015-06-20 DIAGNOSIS — L89102 Pressure ulcer of unspecified part of back, stage 2: Secondary | ICD-10-CM | POA: Insufficient documentation

## 2015-06-20 DIAGNOSIS — E1043 Type 1 diabetes mellitus with diabetic autonomic (poly)neuropathy: Secondary | ICD-10-CM | POA: Insufficient documentation

## 2015-06-20 DIAGNOSIS — L89324 Pressure ulcer of left buttock, stage 4: Secondary | ICD-10-CM | POA: Insufficient documentation

## 2015-06-20 DIAGNOSIS — Z681 Body mass index (BMI) 19 or less, adult: Secondary | ICD-10-CM | POA: Insufficient documentation

## 2015-06-20 DIAGNOSIS — L89314 Pressure ulcer of right buttock, stage 4: Secondary | ICD-10-CM | POA: Insufficient documentation

## 2015-06-20 DIAGNOSIS — G8250 Quadriplegia, unspecified: Secondary | ICD-10-CM | POA: Insufficient documentation

## 2015-06-20 DIAGNOSIS — L89154 Pressure ulcer of sacral region, stage 4: Secondary | ICD-10-CM | POA: Insufficient documentation

## 2015-06-20 DIAGNOSIS — J45909 Unspecified asthma, uncomplicated: Secondary | ICD-10-CM | POA: Insufficient documentation

## 2015-06-20 IMAGING — CR DG CHEST 2V
2 series · 2 of 2 positions shown · non-contrast
Comparison: March 20, 2013

CLINICAL DATA: Chest pain and shortness of breath

CHEST - 2 VIEW

[w chest pa]
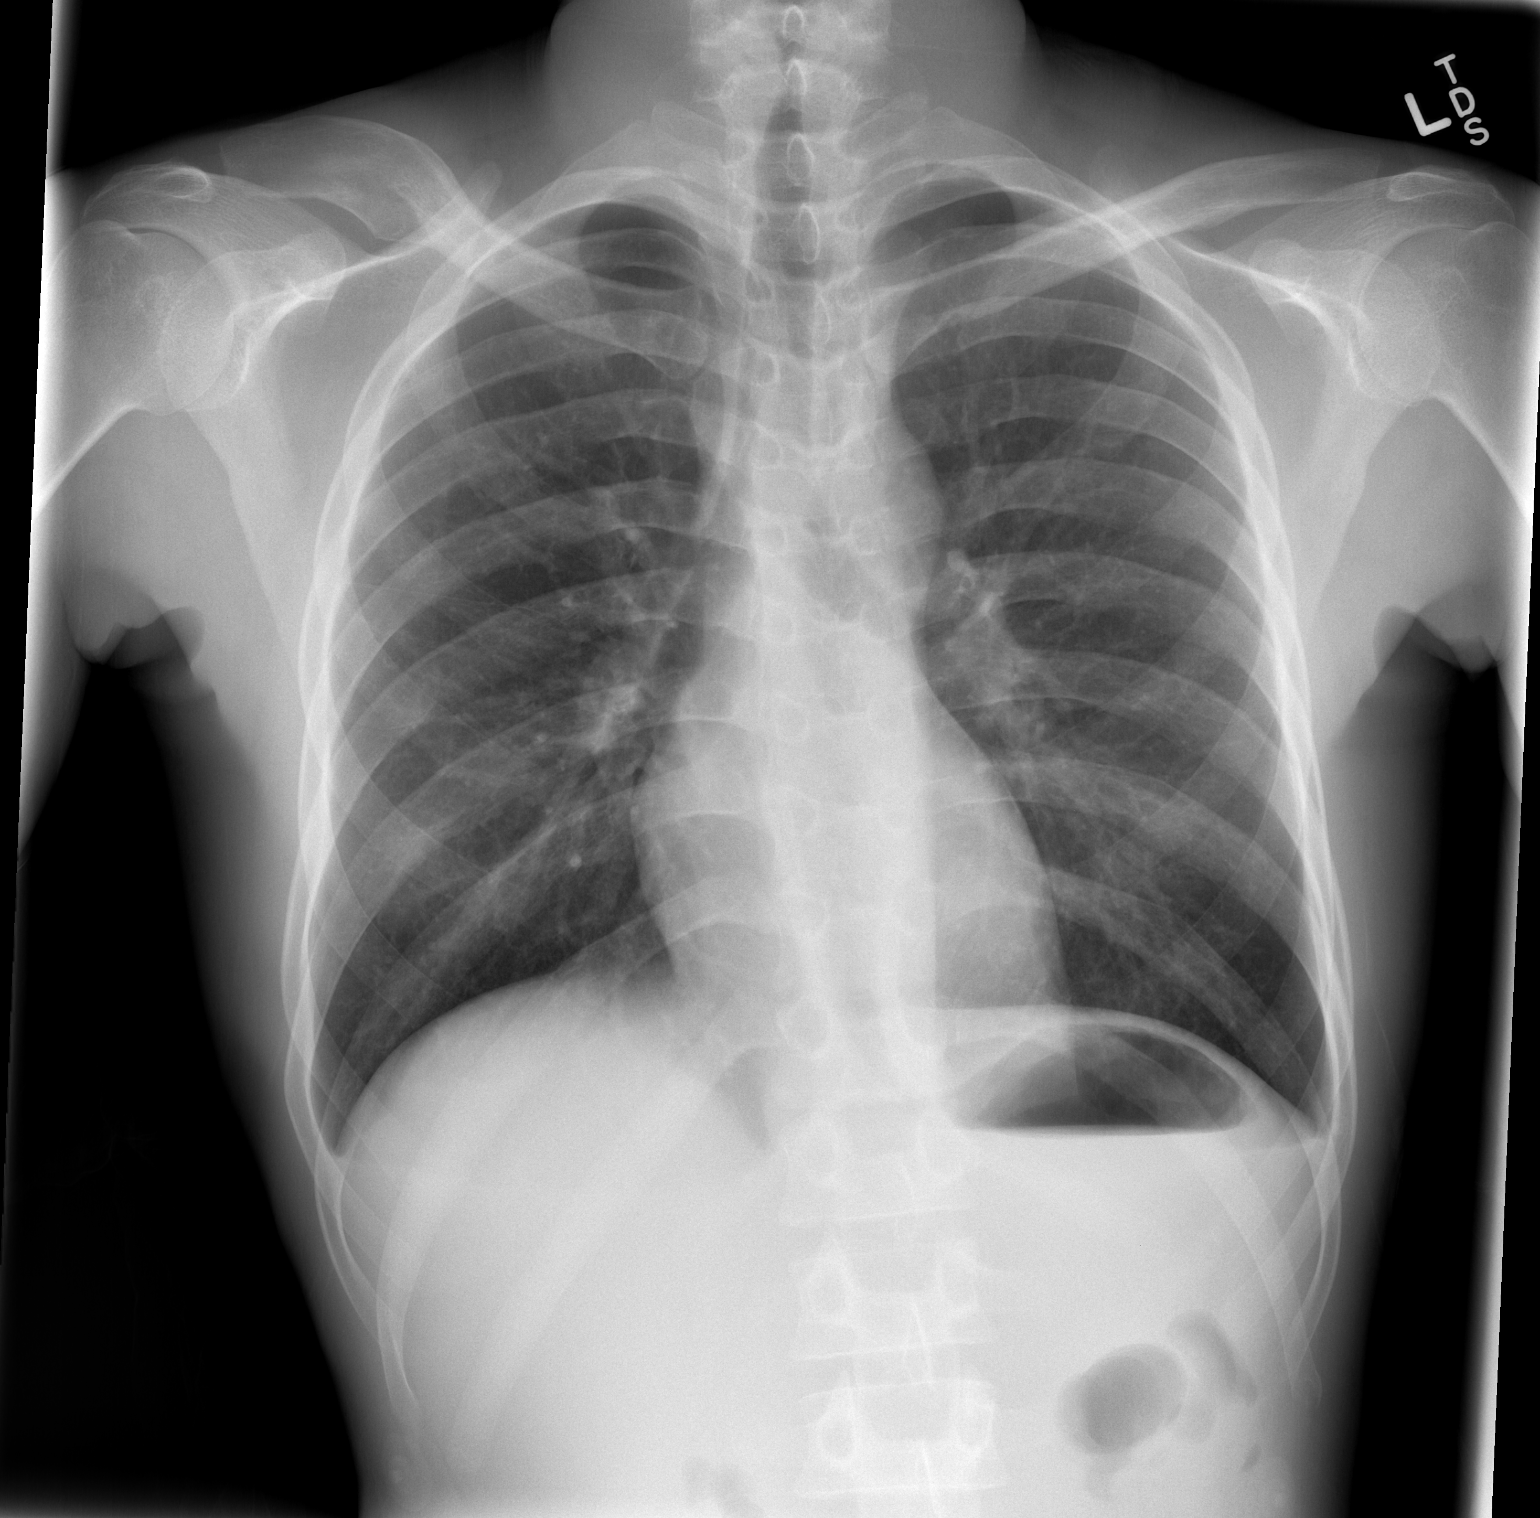

[w chest lat]
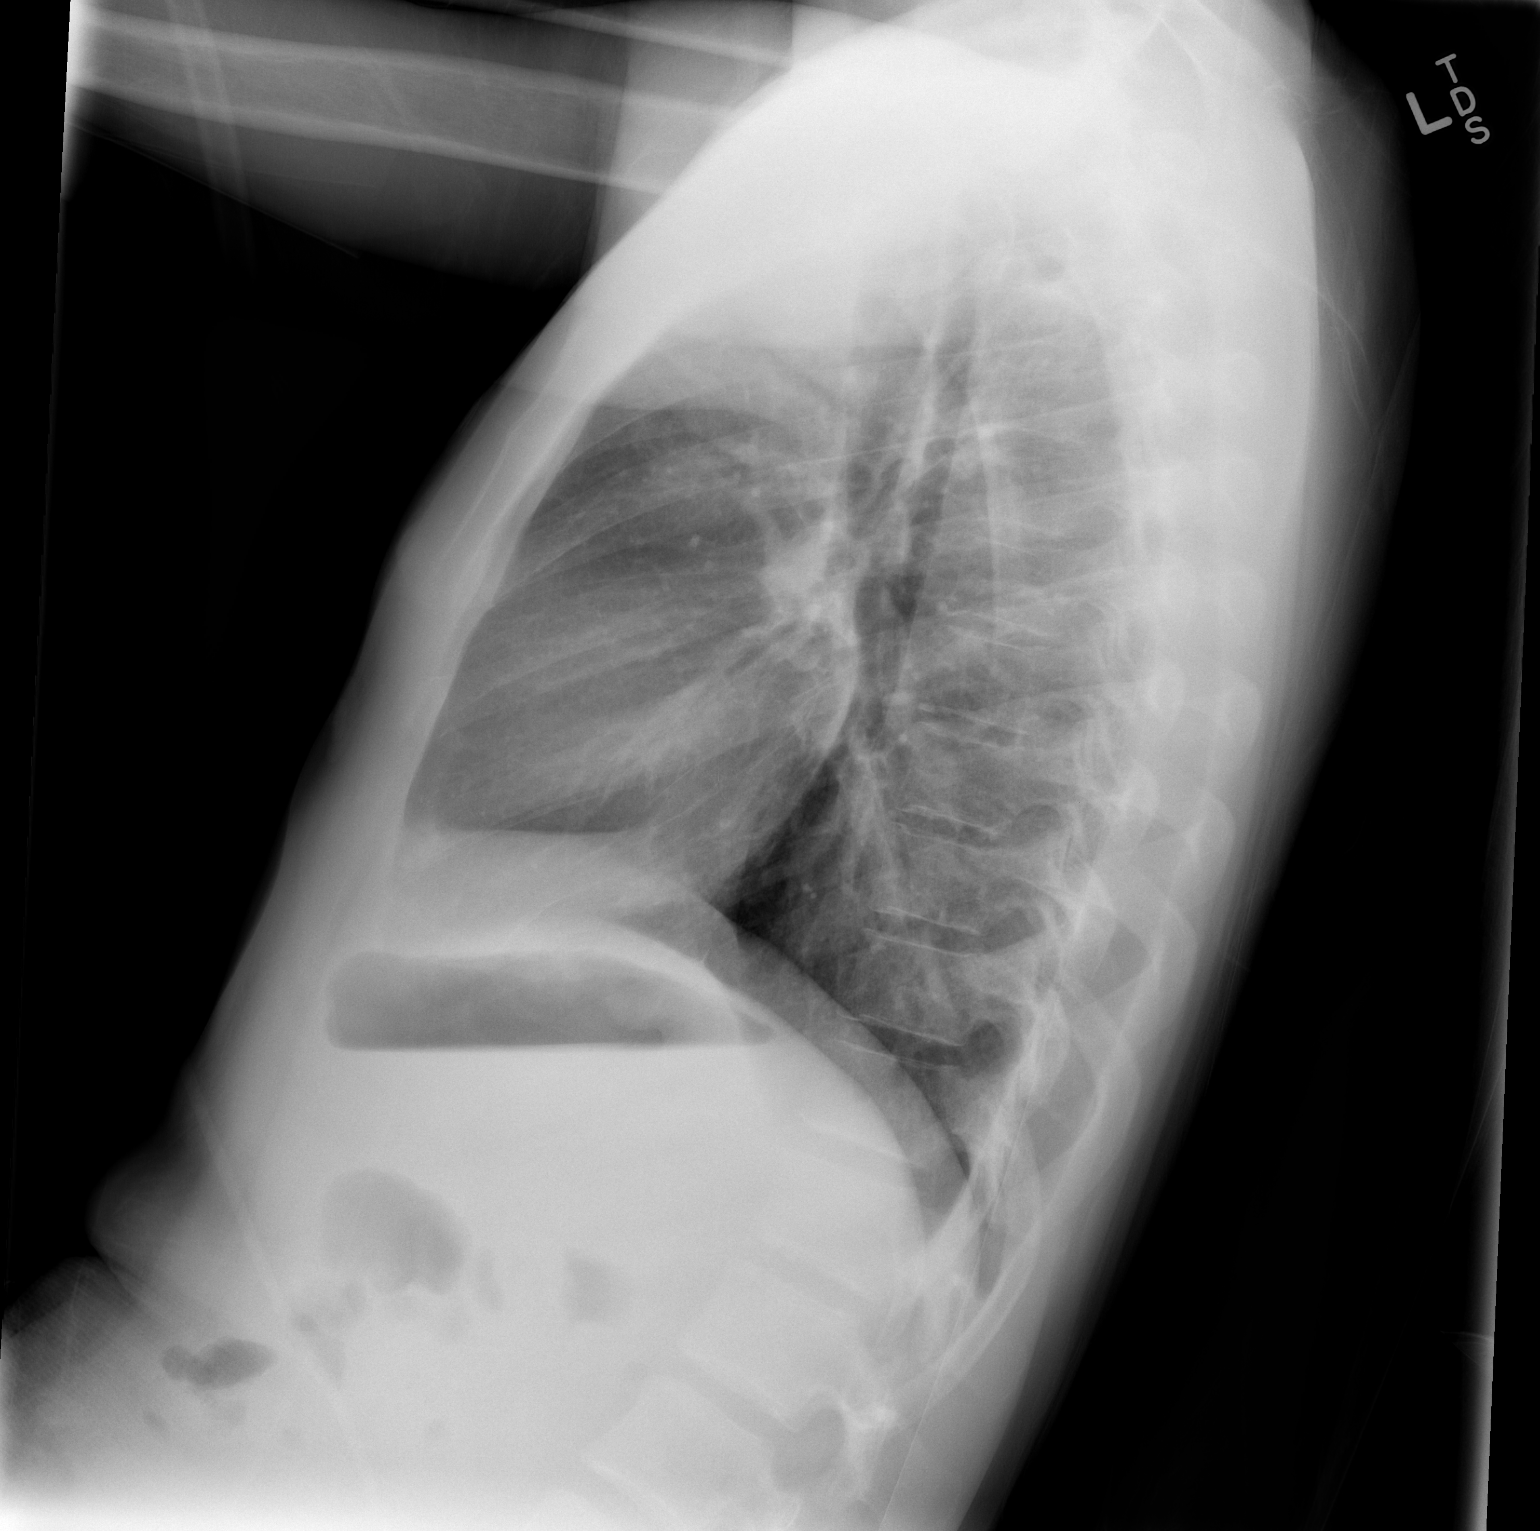

[2 of 2 positions shown; findings below may reference images not displayed]

FINDINGS: Lungs clear.  Heart size and pulmonary vascularity are
normal.  No adenopathy.  No pneumothorax.  No bone lesions.
IMPRESSION: No abnormality noted.

## 2015-06-20 IMAGING — CT CT ABD-PELV W/O CM
2 of 4 series · 15 of 46 positions shown, 17 images · non-contrast
Comparison: Report from ultrasound dated 02/23/2001.

CLINICAL DATA: Abdominal tenderness.  Nausea and vomiting.
Weakness.  Back pain.

CT ABDOMEN AND PELVIS WITHOUT CONTRAST
TECHNIQUE: Multidetector CT imaging of the abdomen and pelvis was
performed following the standard protocol without intravenous
contrast.

[Series 2: abd/ pelvis 5.0 i30f 2 · axial · 0.60mm/px · z∈[-373,-28]mm · 12 of 83 slices shown, 14 images]
[im 7/83  soft-tissue]
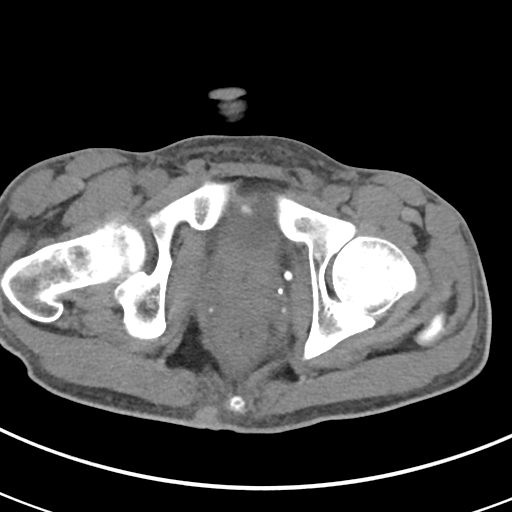
[im 7/83  bone]
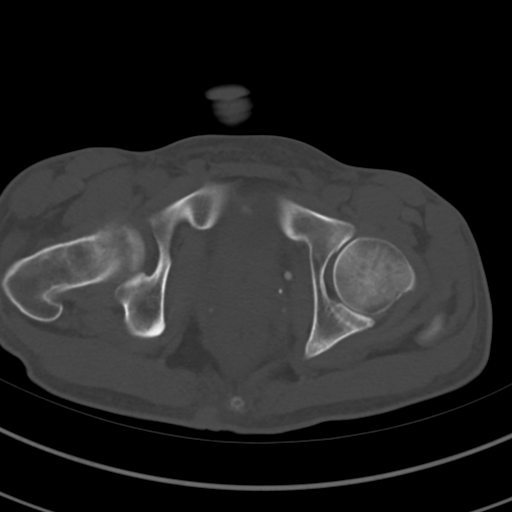
[im 13/83  soft-tissue]
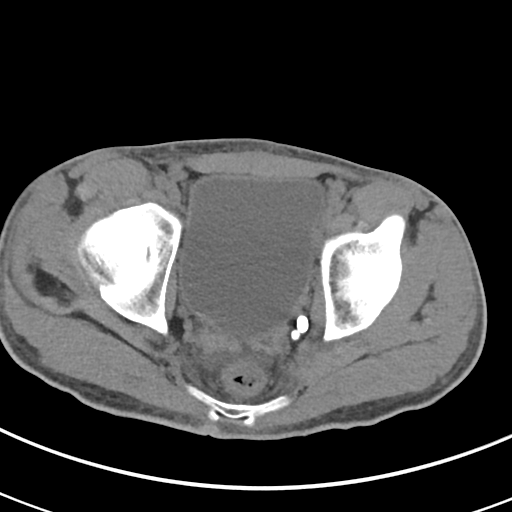
[im 19/83  soft-tissue]
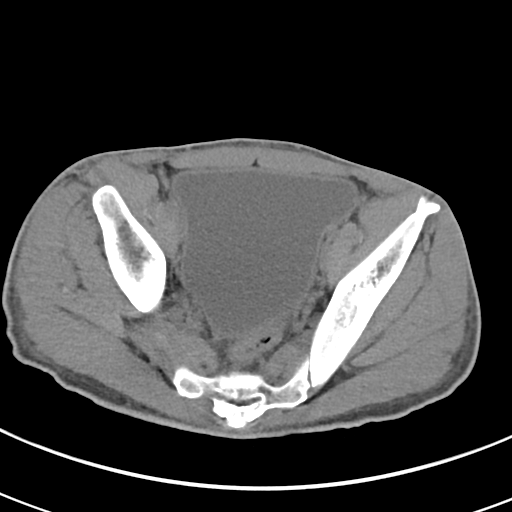
[im 26/83  soft-tissue]
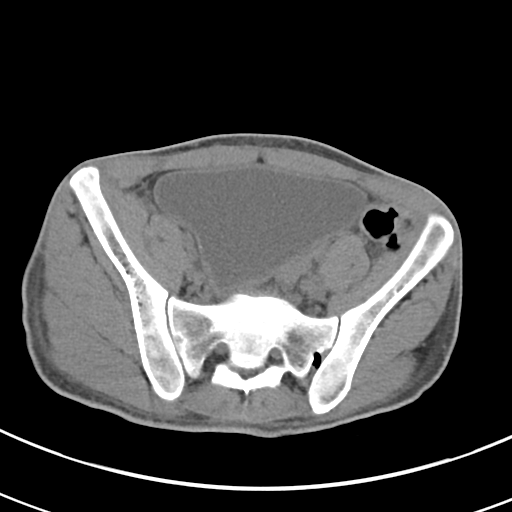
[im 32/83  soft-tissue]
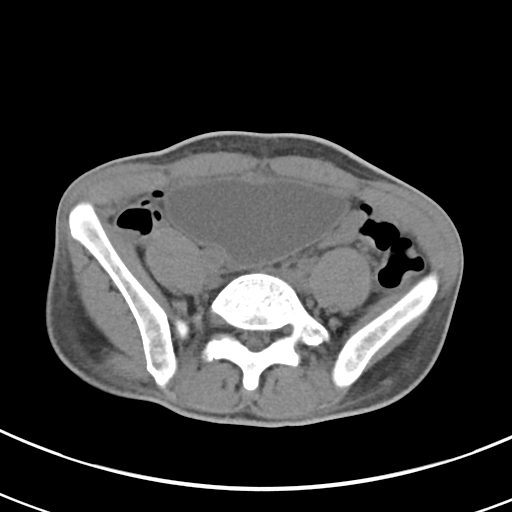
[im 38/83  soft-tissue]
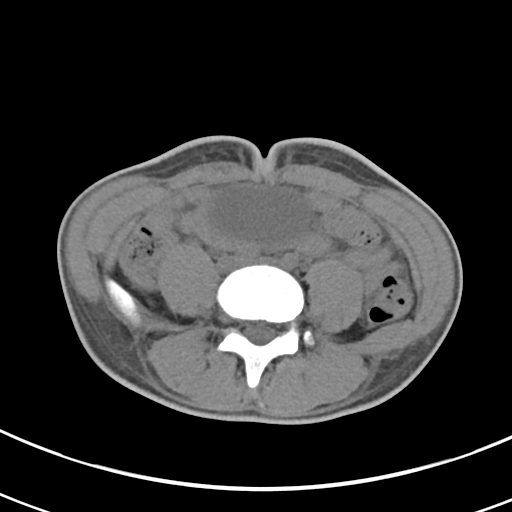
[im 45/83  soft-tissue]
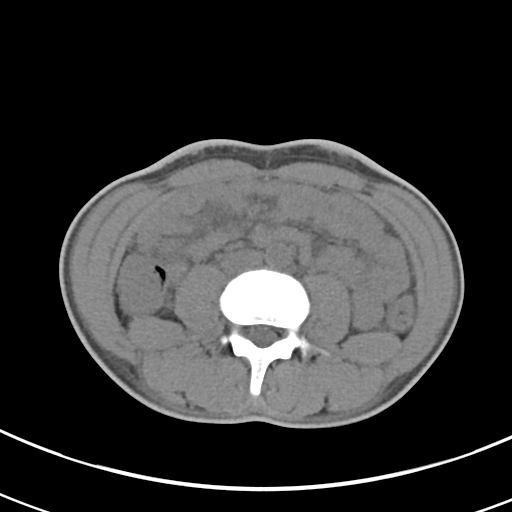
[im 51/83  soft-tissue]
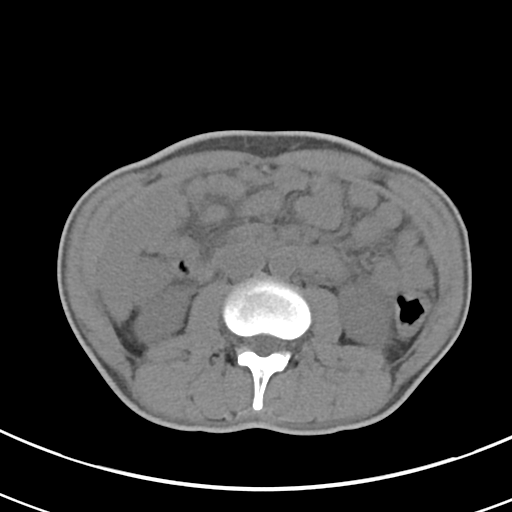
[im 57/83  soft-tissue]
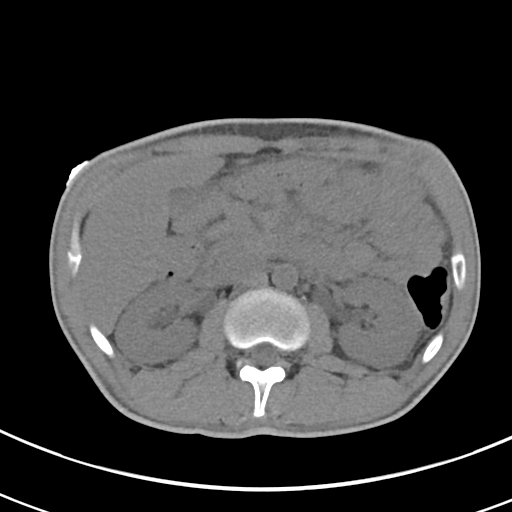
[im 57/83  bone]
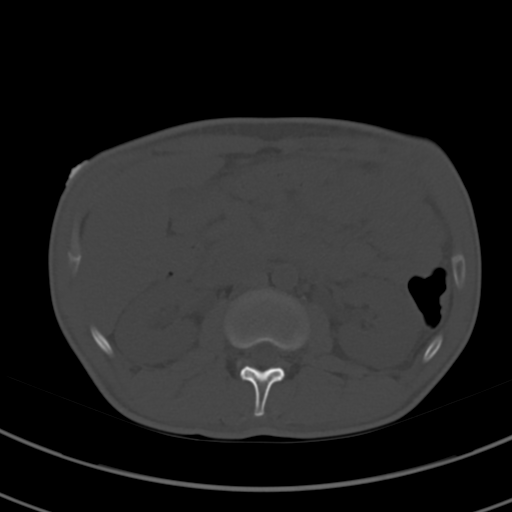
[im 64/83  soft-tissue]
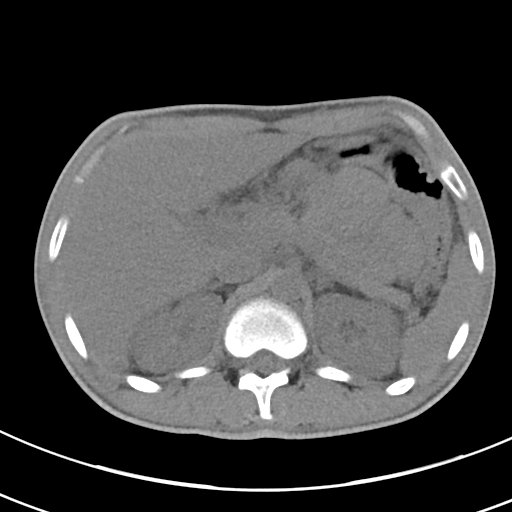
[im 70/83  soft-tissue]
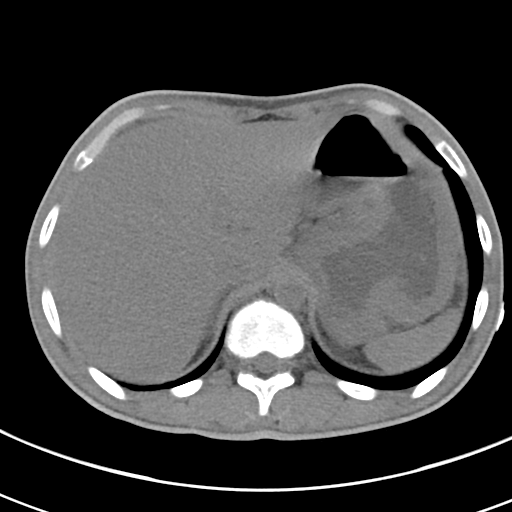
[im 76/83  soft-tissue]
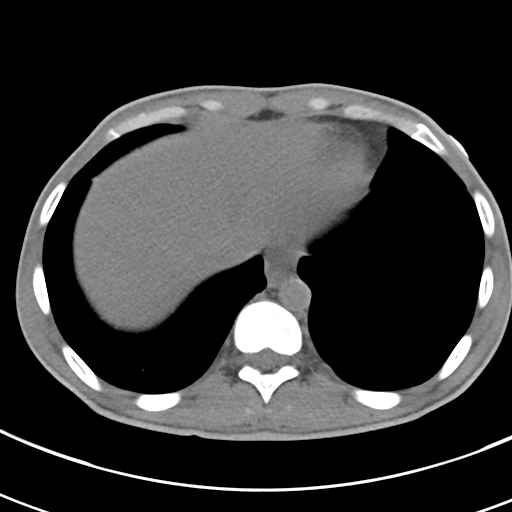

[Series 5: cor st · coronal · 0.57mm/px · 3 of 63 slices shown]
[im 21/63  soft-tissue]
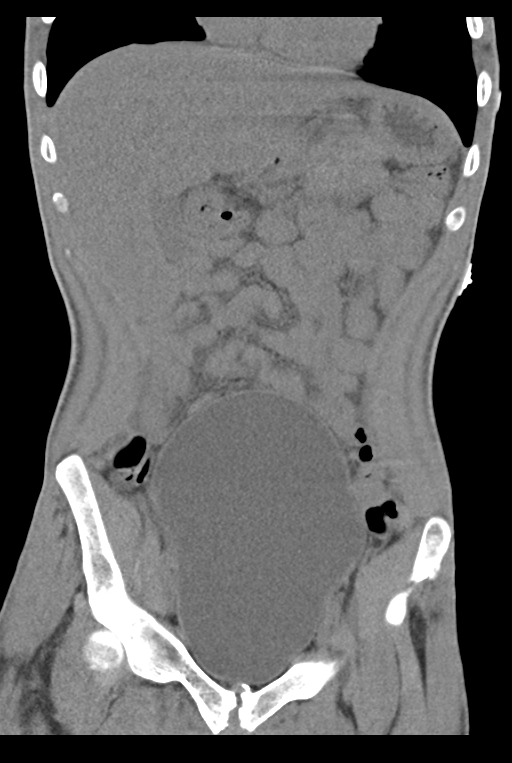
[im 28/63  soft-tissue]
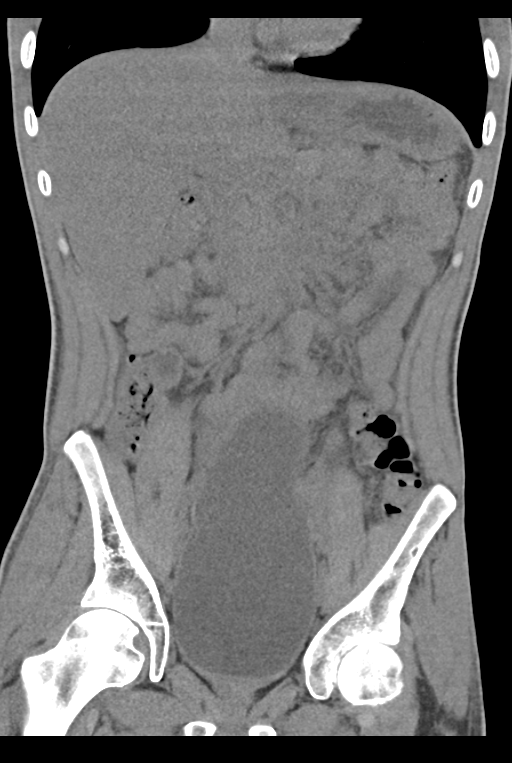
[im 35/63  soft-tissue]
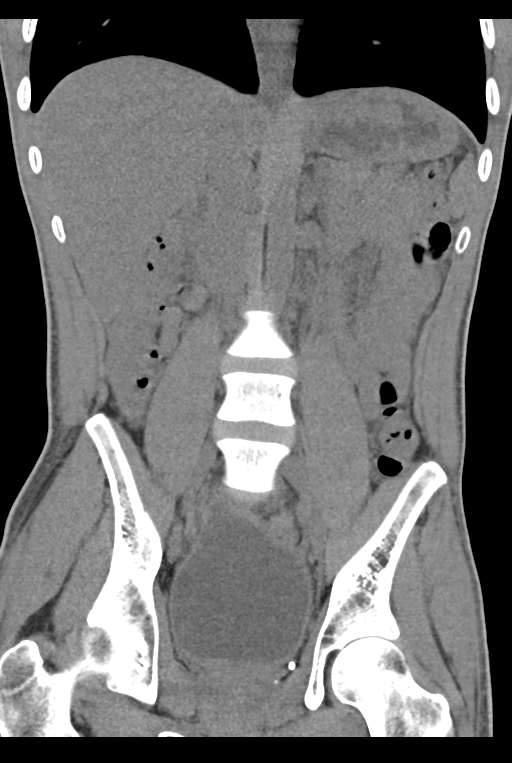

[15 of 46 positions shown; findings below may reference images not displayed]

FINDINGS: The visualized portion of the liver, spleen, pancreas,
and adrenal glands appear unremarkable in noncontrast CT
appearance.

Small contour the gallbladder suggests the contraction.

2 mm right kidney lower pole nonobstructive calculus noted along
with a 102 mm right mid kidney nonobstructive calculus.  Several
punctate left renal calcifications are present including a 2 mm of
left mid kidney calculus.

Diffuse low-level mesenteric stranding noted with trace free pelvic
fluid.

Bilateral pelvic phleboliths are present without ureteral or
bladder calculus.  Urinary bladder is considerably distended,
extending up to the level of the iliac crests.

Given the paucity of intra-abdominal fat and lack of oral and IV
contrast, separation of abdominal structures is problematic.  There
is some faint stranding in the right lower quadrant adjacent to the
appendix, but the appendix is gas-filled and does not demonstrate
wall thickening, and the stranding is similar quality to the
diffuse stranding in the mesentery.

No dilated bowel observed.
IMPRESSION: 1.  Diffuse low-level mesenteric stranding of uncertain
significance.  There is a trace amount of free pelvic fluid.  No
dilated bowel observed.
2.  Some of this stranding is in the vicinity of the appendix, but
the appendix is gas-filled and thin-walled, and accordingly the
appearance is not convincing for acute appendicitis.
3.  Bilateral nonobstructive nephrolithiasis.
4.  Distended urinary bladder.

## 2015-06-20 NOTE — Telephone Encounter (Signed)
Increase the daily water to 150 ml QID plus flushes

## 2015-06-20 NOTE — Telephone Encounter (Signed)
I spoke with Tamika home nurse and went over directions.

## 2015-06-20 NOTE — Telephone Encounter (Signed)
I spoke with Tamika and went over below information.

## 2015-06-20 NOTE — Telephone Encounter (Signed)
tamikas w/ [facility] would like to know if they can increase pt's H2O . Pt is currently on 100 ml 4 X day and 30 cc after med flushes  Concerned pt is not getting enough water. Pt has fluid retention and sediment in urine.  Pt has a super pubic catheter which can also contribute.pls advise

## 2015-06-22 NOTE — Telephone Encounter (Signed)
Yes, I have the PICC removed when the antibiotics stop.

## 2015-06-22 NOTE — Telephone Encounter (Signed)
Do we want to D/C the PICC on 06/26/15 as well, [facility] needs to know. His appt is not until 07/05/15 with [facility].

## 2015-06-23 ENCOUNTER — Telehealth: Payer: Self-pay | Admitting: Internal Medicine

## 2015-06-23 ENCOUNTER — Ambulatory Visit: Payer: Self-pay | Admitting: Infectious Disease

## 2015-06-23 NOTE — Telephone Encounter (Signed)
Mr. Carmelina Noun creatinine is down to 0.92. His latest vancomycin trough level is in the high therapeutic range at 19.4. He is due to stop all antibiotics on 06/26/2015. I gave an order to discontinue vancomycin dosing today and have a PICC removed. He should continue to have therapeutic levels of vancomycin for several more days. He has a follow-up visit with Dr. Tommy Medal on 06/27/2015.

## 2015-06-23 NOTE — Telephone Encounter (Signed)
Patient is due to end antibiotics on 9/18 but he has an appointment on 9/27 with Dr. Tommy Medal, Jeani Hawking from [facility] wanted to know if he should send enough for 3 days or through 9/27? Please advise

## 2015-06-23 NOTE — Telephone Encounter (Signed)
I was called by [place] again today. It appears that his follow-up visit with Dr. Drucilla Schmidt is not until 07/05/2015. Jason [name] mother is very concerned about stopping his vancomycin and having the PICC removed before that visit. I agreed to extend vancomycin therapy until that time.

## 2015-06-27 ENCOUNTER — Other Ambulatory Visit: Payer: Self-pay | Admitting: Family Medicine

## 2015-06-27 ENCOUNTER — Other Ambulatory Visit: Payer: Self-pay | Admitting: Pulmonary Disease

## 2015-06-27 DIAGNOSIS — G8250 Quadriplegia, unspecified: Secondary | ICD-10-CM | POA: Diagnosis not present

## 2015-06-27 DIAGNOSIS — Z8614 Personal history of Methicillin resistant Staphylococcus aureus infection: Secondary | ICD-10-CM | POA: Diagnosis not present

## 2015-06-27 DIAGNOSIS — K219 Gastro-esophageal reflux disease without esophagitis: Secondary | ICD-10-CM | POA: Diagnosis not present

## 2015-06-27 DIAGNOSIS — M1990 Unspecified osteoarthritis, unspecified site: Secondary | ICD-10-CM | POA: Diagnosis not present

## 2015-06-27 DIAGNOSIS — L89314 Pressure ulcer of right buttock, stage 4: Secondary | ICD-10-CM | POA: Diagnosis not present

## 2015-06-27 DIAGNOSIS — G40909 Epilepsy, unspecified, not intractable, without status epilepticus: Secondary | ICD-10-CM | POA: Diagnosis not present

## 2015-06-27 DIAGNOSIS — L89102 Pressure ulcer of unspecified part of back, stage 2: Secondary | ICD-10-CM | POA: Diagnosis not present

## 2015-06-27 DIAGNOSIS — E1040 Type 1 diabetes mellitus with diabetic neuropathy, unspecified: Secondary | ICD-10-CM | POA: Diagnosis not present

## 2015-06-27 DIAGNOSIS — R634 Abnormal weight loss: Secondary | ICD-10-CM | POA: Diagnosis not present

## 2015-06-27 DIAGNOSIS — M797 Fibromyalgia: Secondary | ICD-10-CM | POA: Diagnosis not present

## 2015-06-27 DIAGNOSIS — J45909 Unspecified asthma, uncomplicated: Secondary | ICD-10-CM | POA: Diagnosis not present

## 2015-06-27 DIAGNOSIS — L89622 Pressure ulcer of left heel, stage 2: Secondary | ICD-10-CM | POA: Diagnosis present

## 2015-06-27 DIAGNOSIS — L89324 Pressure ulcer of left buttock, stage 4: Secondary | ICD-10-CM | POA: Diagnosis not present

## 2015-06-27 DIAGNOSIS — E109 Type 1 diabetes mellitus without complications: Secondary | ICD-10-CM | POA: Diagnosis not present

## 2015-06-27 DIAGNOSIS — K3184 Gastroparesis: Secondary | ICD-10-CM | POA: Diagnosis not present

## 2015-06-27 DIAGNOSIS — Z794 Long term (current) use of insulin: Secondary | ICD-10-CM | POA: Diagnosis not present

## 2015-06-27 DIAGNOSIS — Z681 Body mass index (BMI) 19 or less, adult: Secondary | ICD-10-CM | POA: Diagnosis not present

## 2015-06-27 DIAGNOSIS — Z933 Colostomy status: Secondary | ICD-10-CM | POA: Diagnosis not present

## 2015-06-27 DIAGNOSIS — L89154 Pressure ulcer of sacral region, stage 4: Secondary | ICD-10-CM | POA: Diagnosis not present

## 2015-06-27 DIAGNOSIS — E1043 Type 1 diabetes mellitus with diabetic autonomic (poly)neuropathy: Secondary | ICD-10-CM | POA: Diagnosis not present

## 2015-06-27 DIAGNOSIS — Z930 Tracheostomy status: Secondary | ICD-10-CM | POA: Diagnosis not present

## 2015-06-27 IMAGING — CR DG CHEST 2V
2 series · 2 of 2 positions shown · non-contrast
Comparison: 04/11/2013

CLINICAL DATA: Syncope.  Hypotension.  Diarrhea.

CHEST - 2 VIEW

[w chest lat]
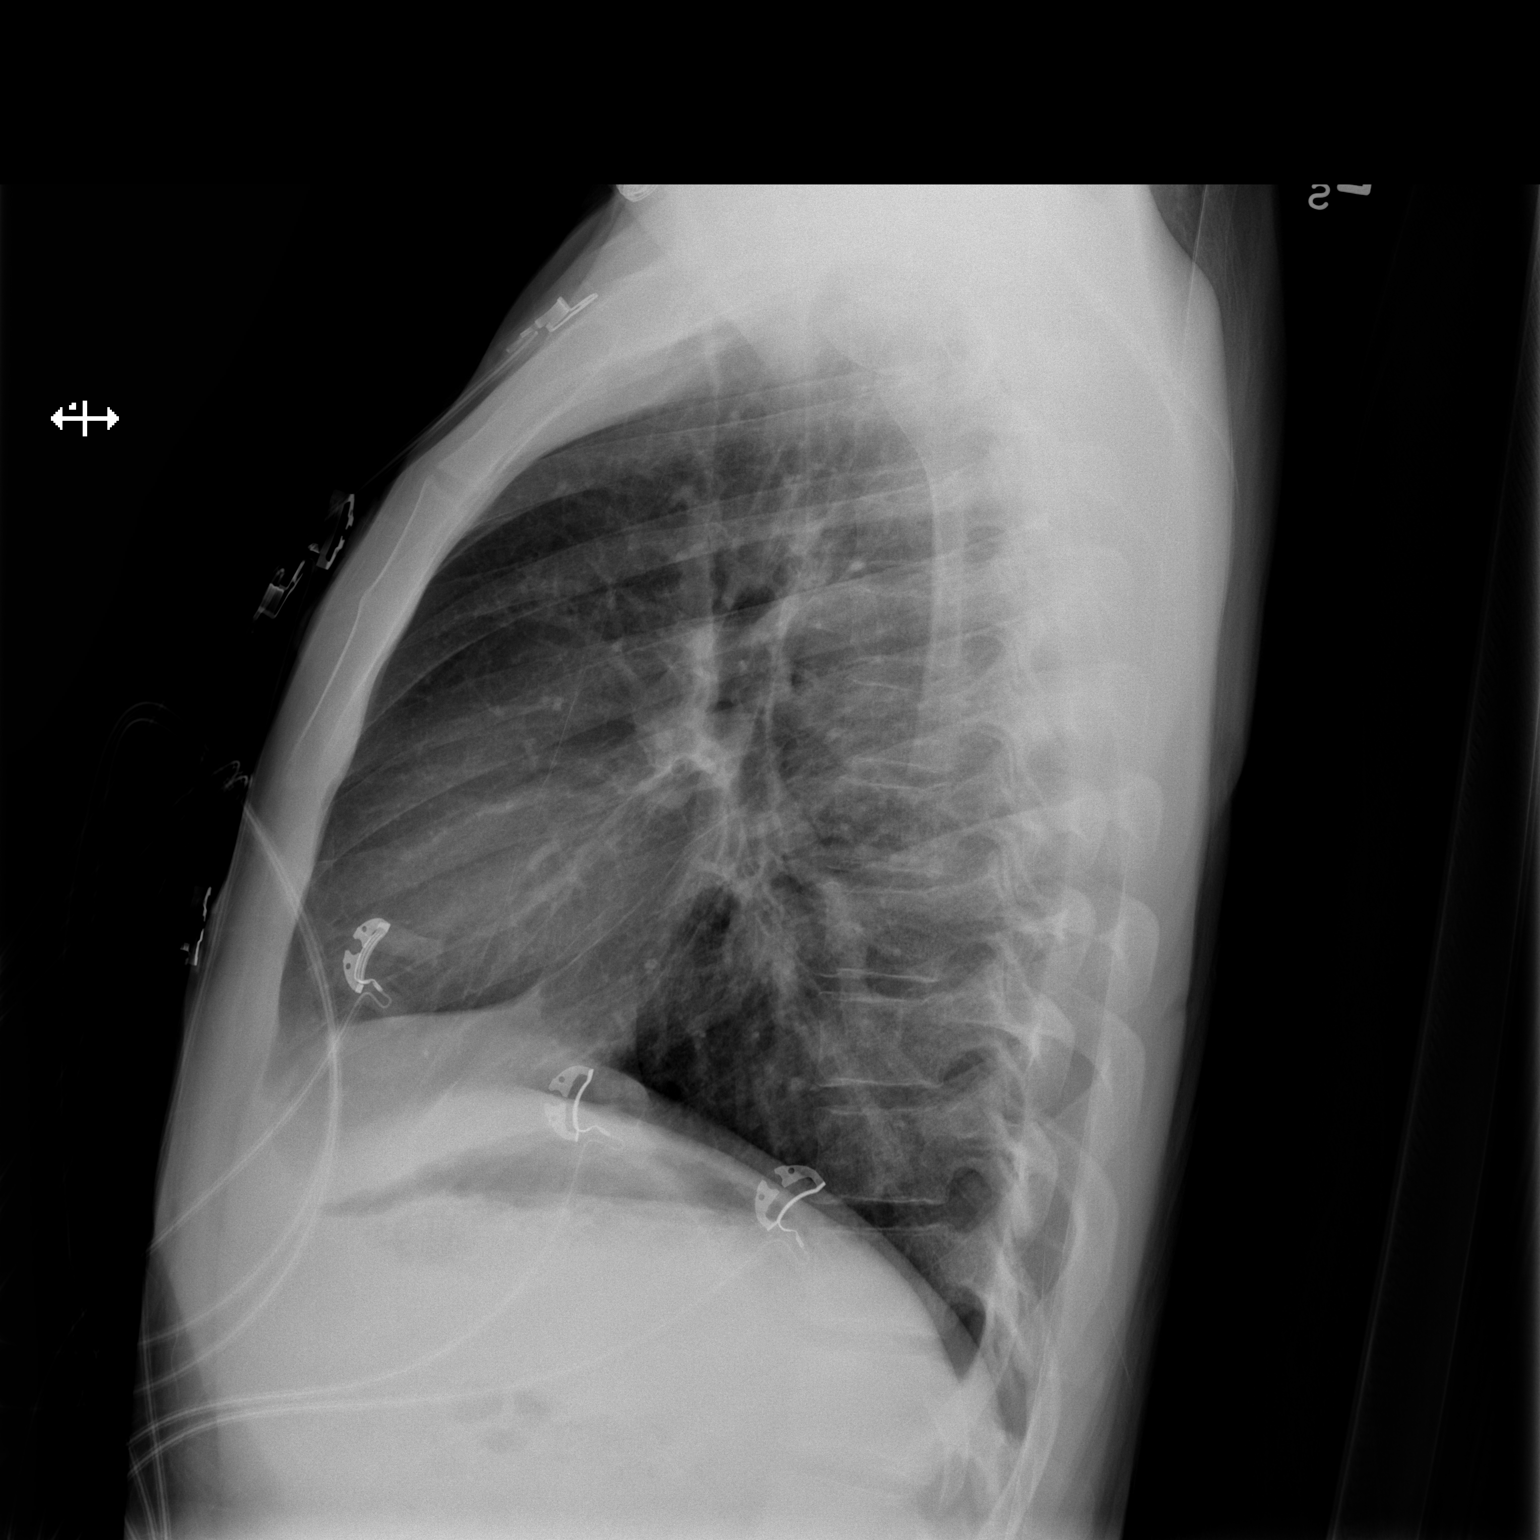

[x chest ap]
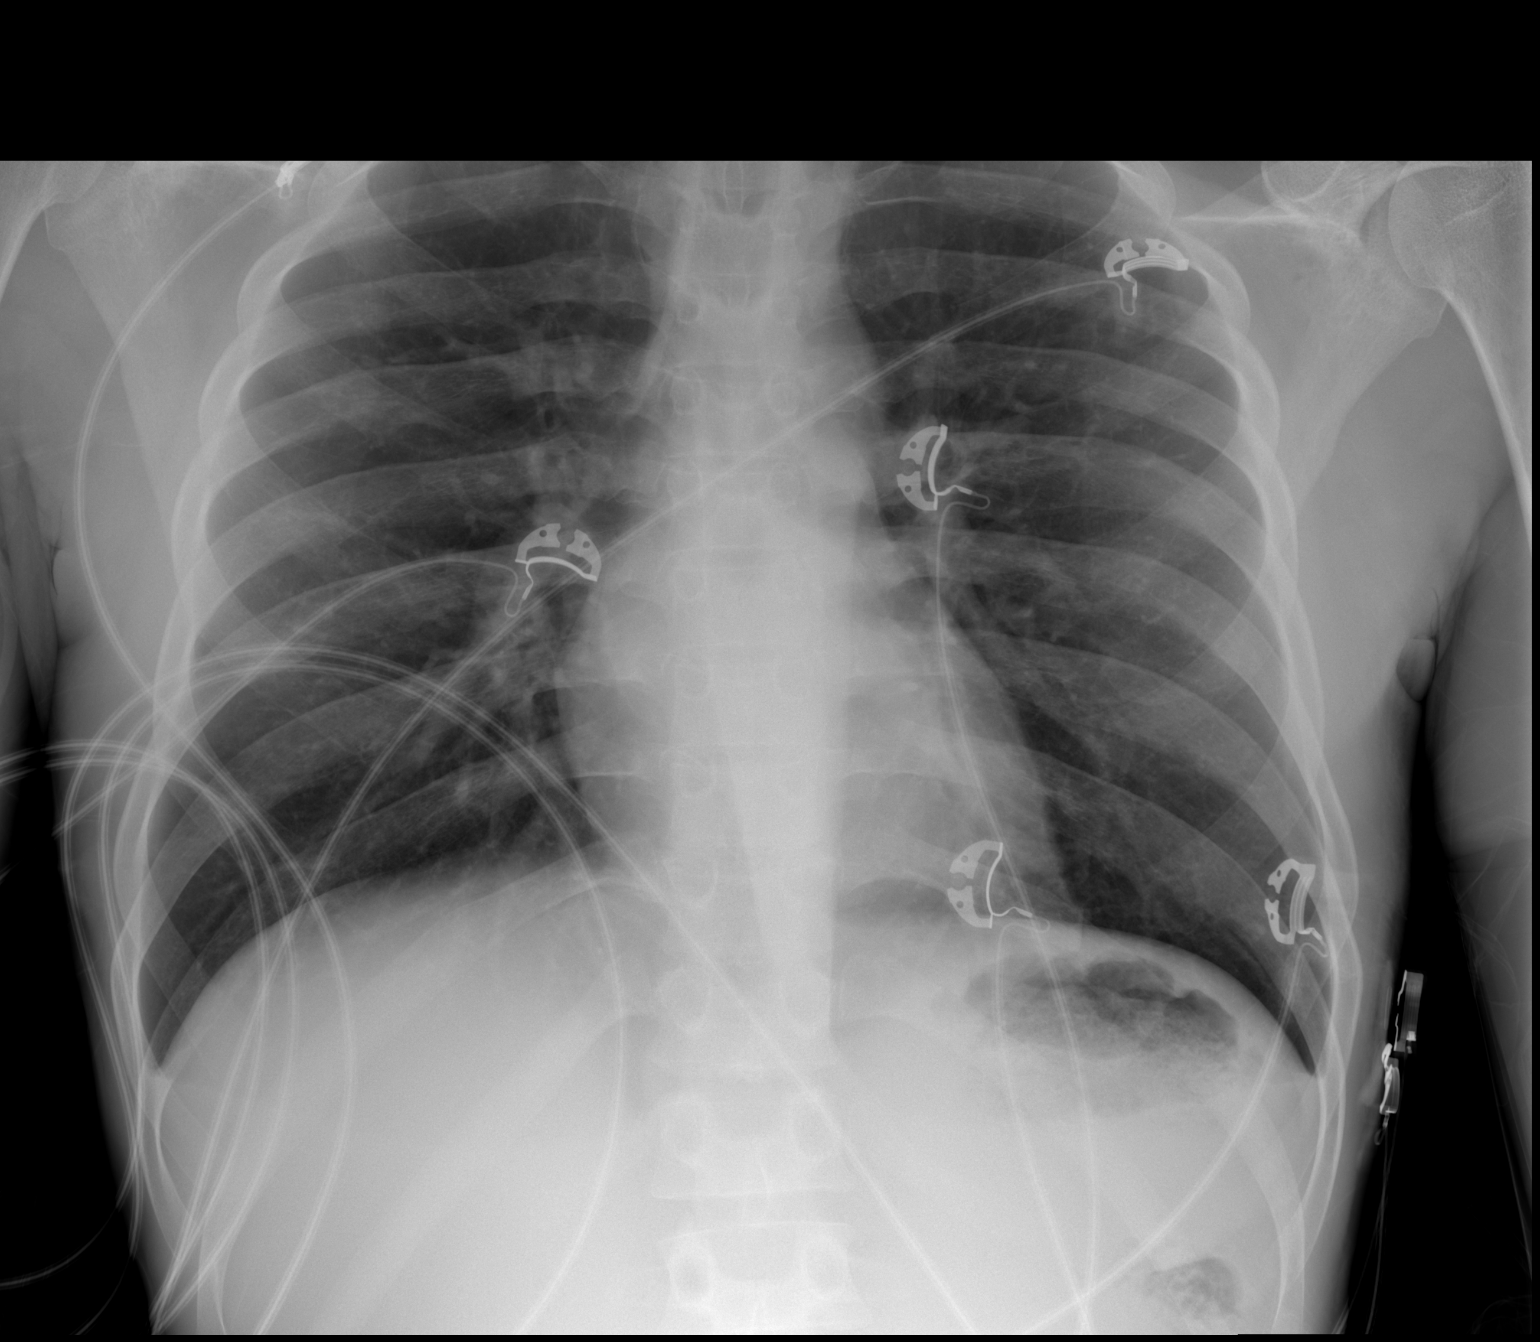

[2 of 2 positions shown; findings below may reference images not displayed]

FINDINGS: The heart size and mediastinal contours are within
normal limits.  Both lungs are clear.  The visualized skeletal
structures are unremarkable.
IMPRESSION: No active cardiopulmonary disease.

## 2015-06-28 ENCOUNTER — Telehealth: Payer: Self-pay | Admitting: *Deleted

## 2015-06-28 DIAGNOSIS — L89159 Pressure ulcer of sacral region, unspecified stage: Secondary | ICD-10-CM

## 2015-06-28 DIAGNOSIS — M869 Osteomyelitis, unspecified: Secondary | ICD-10-CM

## 2015-06-28 NOTE — Telephone Encounter (Addendum)
Tamika LPN working with [facility] is calling for a refill of oxycodone.  In looking at his medication Rx date 06/03/15 I pulled his [place] and found that he(his family) filled multiple provider RX's for [facility] and oxycodone all on 8/25 and 06/03/15.  Per Dr Naaman Plummer note on that day, he was willing to write his fentanyl increasing his oxycodone ONLY if he decreased the amount of fentanyl, which he did giving him #90 20 mg oxycodone and #10 fentlanyl 53mcg with plan of decreasing him even further to 50 mcg. This was to be in place of the rx [place] had received from [place] NP at discharge for 10 mg oxycodone #30 and Fentanyl 146mcg #5.  Per the [facility] and then verified with the pharmacisit at [facility], he filled all these prescriptions misleading the pharmacy that there had been dose changes therefore the pharmacy agreed to fill them. Awaiting call back from Andra's mother with an explanation of what happened to the meds from [place] NP.

## 2015-06-29 ENCOUNTER — Ambulatory Visit (HOSPITAL_COMMUNITY)
Admit: 2015-06-29 | Discharge: 2015-06-29 | Disposition: A | Discharge status: Home / Self Care | Payer: Medicaid Other | Source: Ambulatory Visit | Attending: Acute Care | Admitting: Acute Care

## 2015-06-29 ENCOUNTER — Telehealth: Payer: Self-pay | Admitting: Family Medicine

## 2015-06-29 DIAGNOSIS — Z930 Tracheostomy status: Secondary | ICD-10-CM | POA: Insufficient documentation

## 2015-06-29 DIAGNOSIS — J9611 Chronic respiratory failure with hypoxia: Secondary | ICD-10-CM

## 2015-06-29 MED ORDER — OXYCODONE HCL 20 MG PO TABS: 20.0000 mg | ORAL_TABLET | Freq: Four times a day (QID) | ORAL | Status: DC | PRN

## 2015-06-29 MED ORDER — FENTANYL 50 MCG/HR TD PT72: 50.0000 ug | MEDICATED_PATCH | TRANSDERMAL | Status: DC

## 2015-06-29 NOTE — Telephone Encounter (Signed)
Home nurse Tameka called [phone] for a order to give a Flu vaccine to pt. Per Dr. Sarajane Jews okay to give the verbal order and I did speak with Tameka and gave this information.

## 2015-06-29 NOTE — Telephone Encounter (Signed)
He has plenty of refills available from [facility]

## 2015-06-29 NOTE — Telephone Encounter (Signed)
I spoke with [name] as well as his caretaker Jason Bleak LPN from [place].  [name] states that she still has the meds prescribed by [facility] NP from discharge (100 mcg fentanyl #5 patches, and oxycodone 10mg  #30 pills).  I discussed the medications with Dr Naaman Plummer and he is going to decrease Jason [name] to 50 mcg Fentanyl patches q 3 days #10, and increasing the oxycodone to 20 mg q 6 hours prn #120.  I have given [name] specific instructions on what to do with his medications.  She is to return the 100 mcg patches to our office at next appt 07/04/15 with Dr Naaman Plummer, for [name] to destroy.  They can keep the #30 10 mg oxycodone tablets and take them ii every 6 hours prn until gone which will cover ~3.75 days. Then he will begin the 20 mg tablets.  This will mean he has the equivalent of 15 additional oxycodone this month. Here are the written instructions given to [name]:   1)100 mcg patches are to be returned to our office to destroy at next appointment.   2) Fentanyl is being decreased to 50 mcg every 3 days, due to the increase in oxycodone. 3) Do not begin the 20 mg oxycodone 4 times per day while using the 75 mcg Fentanyl. (stop using the 75 mcg patch) 4) Oxycodone 20 mg tablets are being increased to #120.  He can have NO MORE than 4 tablets per day.  If he is having dressing changes three times per day, then he has one additional pill he can take for break through pain. 5) He has ( #30 )10 mg tablets of oxycodone from [facility] NP from his hospital discharge.  He can take 2 of these (total 20 mg) at each time up to 4 times per day until gone.  That should be 8 pills per day for 3 days and part of the fourth day.  Then he will begin the 20 gm tablet up to 4 times per day. Do not go over 4 pills per day.  When he returns for his pill counts he should be accountable for the 15 additional doses of oxycodone, and fentanyl (100 mcg) #5 and #2 (18mcg)

## 2015-06-29 NOTE — Progress Notes (Addendum)
Tracheostomy Procedure Note  ALHASSAN EVERINGHAM [id number] 08/01/1986  Pre Procedure Tracheostomy Information  Trach Brand: Shiley Size: 8 Style: Cuffed Secured by: Velcro  VS- BP-94/69, RR-18, HR-78, SpO2-98% on RA with PMV  Procedure: trach downsized to 6 shiley   Post Procedure Tracheostomy Information  Trach Brand: Shiley Size: 6 Style: Cuffed Secured by: Velcro   Post Procedure Evaluation:  ETCO2 positive color change from yellow to purple : Yes.   Vital signs:blood pressure 106/74, pulse 77, respirations 18 and pulse oximetry 99% on RA with PMV Patients current condition: stable Complications: No apparent complications Trach site exam: clean Wound care done: 4 x 4 gauze Patient did tolerate procedure well.   Education: Can begin to chew gum & have ice chips sparingly. If he begins coughing too much do not do anymore  Prescription needs: 6 shiley cuffed trach Size 6 inner cannulas Decrease RR-12 on vent @ night    Additional needs: Backup 6 shiley given to home health RN, obturator sent home as well See back in trach clinic in 3 months

## 2015-06-29 NOTE — Progress Notes (Addendum)
CC: trach clinic establishment   Brief History  29 Year old male w/ sig medical history which includes: Asthma; MRSA infection; Gastroparesis; Diabetic neuropathy; Seizures; Family history of anesthesia complication; Dysrhythmia; Pneumonia; Arthritis; Fibromyalgia; Stroke (01/29/2014); Type I diabetes mellitus; and Syncope (02/16/2015). 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 Recently discharged after a prolonged hospital stay for MRSA bacteremia, infected sacral wound and UTI. Course further complicated by acinetobacter PNA and failure to wean. Ultimately trach'd and sent home on nocturnal ventilation. ROV today for routine trach care and to get established for trach clinic    Past Medical History  Diagnosis Date  . GERD (gastroesophageal reflux disease)   . Asthma   . Hx MRSA infection     on face  . Gastroparesis   . Diabetic neuropathy   . Seizures   . Family history of anesthesia complication     Pt mother can't have epidural procedures  . Dysrhythmia   . Pneumonia   . Arthritis   . Fibromyalgia   . 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 Type I diabetes mellitus     sees Dr. Loanne Drilling   . Syncope 02/16/2015   Family History  Problem Relation Age of Onset  . Diabetes Father   . Hypertension Father   . Asthma      fhx  . Hypertension      fhx  . Stroke      fhx  . Heart disease Mother    Allergies  Allergen Reactions  . Cefuroxime Axetil Anaphylaxis  . Ertapenem Other (See Comments)    Rash and confusion-->tolerated Imipenem   . Morphine And Related Other (See Comments)    Changed mental status, confusion, headache, visual hallucination  . Penicillins Anaphylaxis and Other (See Comments)    ?can take amoxicillin? Tolerated Imipenem   . Sulfa Antibiotics Anaphylaxis, Shortness Of Breath and Other (See Comments)  . Tessalon [Benzonatate] Anaphylaxis  . Shellfish Allergy Itching and Other (See Comments)   Took benadryl to alleviate reaction  . Miripirium Rash and Other (See Comments)    Change in mental status   Social History   Social History  . Marital Status: Single    Spouse Name: N/A  . Number of Children: 0  . Years of Education: 11   Occupational History  . Disability    Social History Main Topics  . Smoking status: Former Smoker -- 1.00 packs/day for 12 years    Types: Cigars, Cigarettes    Quit date: 09/07/2014  . Smokeless tobacco: Never Used  . Alcohol Use: No  . Drug Use: No  . Sexual Activity: Not Currently   Other Topics Concern  . Not on file   Social History Narrative   Patient lives at home with mother father.    Patient has 2 children.    Patient is single.    Patient was left hand but after stroke patient is now right handed.    Patient has  11th grade education.    Focused Review of Systems:   Bolds are positive  HENT: doesn't like the trach tie. But otherwise good. No cough, no secretions. PULM: no SOB, not using vent at night; feels like it is forcing inspiratory efforts when he is trying to inhale, No cough, no sputum no new issues. Wants to eat   Size: 6 Style: Cuffed Secured by: Velcro  VS- BP-94/69, RR-18, HR-78, SpO2-98% on RA with PMV  General appearance:  29 Year old  Mouth:  membranes and no mucosal ulcerations; normal hard and soft palate Neck: Trachea midline, # 8 cuffed trach,  trach stoma unremarkable.  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   Erick Colace ACNP-BC [facility] Pager # [phone] OR # [phone] if no answer

## 2015-07-01 ENCOUNTER — Telehealth: Payer: Self-pay | Admitting: Internal Medicine

## 2015-07-01 ENCOUNTER — Telehealth: Payer: Self-pay | Admitting: Family Medicine

## 2015-07-01 ENCOUNTER — Inpatient Hospital Stay: Payer: Self-pay | Admitting: Internal Medicine

## 2015-07-01 NOTE — Telephone Encounter (Signed)
Jason [name] from [place] call to say that she need a rx for the flu vaccine called in to [facility]     [phone]

## 2015-07-01 NOTE — Telephone Encounter (Signed)
Patient's nurse, Beau Fanny states that patient had an appointment on Monday at the [facility].  Jason [name] changed patient's Trach.  He also requested decrease on Ventilator from 16 to 12.  Jason [name] was sending order to [facility], but order has not been received at [facility].    Laurey Arrow- have you sent this order to [facility]?  Please advise.

## 2015-07-01 NOTE — Telephone Encounter (Signed)
Per Dr. Sarajane Jews okay to call in flu vaccine order. I did speak with pharmacy and gave verbal order and also spoke with Tameka.

## 2015-07-04 ENCOUNTER — Encounter: Payer: Medicaid Other | Attending: Registered Nurse | Admitting: Physical Medicine & Rehabilitation

## 2015-07-04 ENCOUNTER — Other Ambulatory Visit: Payer: Self-pay | Admitting: Family Medicine

## 2015-07-04 ENCOUNTER — Encounter: Payer: Self-pay | Admitting: Physical Medicine & Rehabilitation

## 2015-07-04 VITALS — BP 109/73 | HR 64 | Resp 14

## 2015-07-04 DIAGNOSIS — E1040 Type 1 diabetes mellitus with diabetic neuropathy, unspecified: Secondary | ICD-10-CM

## 2015-07-04 DIAGNOSIS — M869 Osteomyelitis, unspecified: Secondary | ICD-10-CM | POA: Diagnosis not present

## 2015-07-04 DIAGNOSIS — Z79899 Other long term (current) drug therapy: Secondary | ICD-10-CM | POA: Diagnosis present

## 2015-07-04 DIAGNOSIS — Z5181 Encounter for therapeutic drug level monitoring: Secondary | ICD-10-CM | POA: Insufficient documentation

## 2015-07-04 DIAGNOSIS — E1041 Type 1 diabetes mellitus with diabetic mononeuropathy: Secondary | ICD-10-CM | POA: Diagnosis present

## 2015-07-04 DIAGNOSIS — L8994 Pressure ulcer of unspecified site, stage 4: Secondary | ICD-10-CM | POA: Diagnosis not present

## 2015-07-04 DIAGNOSIS — G9519 Other vascular myelopathies: Secondary | ICD-10-CM | POA: Diagnosis not present

## 2015-07-04 DIAGNOSIS — R1314 Dysphagia, pharyngoesophageal phase: Secondary | ICD-10-CM

## 2015-07-04 DIAGNOSIS — E1065 Type 1 diabetes mellitus with hyperglycemia: Secondary | ICD-10-CM

## 2015-07-04 DIAGNOSIS — IMO0002 Reserved for concepts with insufficient information to code with codable children: Secondary | ICD-10-CM

## 2015-07-04 DIAGNOSIS — N319 Neuromuscular dysfunction of bladder, unspecified: Secondary | ICD-10-CM | POA: Insufficient documentation

## 2015-07-04 DIAGNOSIS — N390 Urinary tract infection, site not specified: Secondary | ICD-10-CM | POA: Diagnosis present

## 2015-07-04 DIAGNOSIS — G8250 Quadriplegia, unspecified: Secondary | ICD-10-CM

## 2015-07-04 DIAGNOSIS — G9511 Acute infarction of spinal cord (embolic) (nonembolic): Secondary | ICD-10-CM

## 2015-07-04 DIAGNOSIS — E1140 Type 2 diabetes mellitus with diabetic neuropathy, unspecified: Secondary | ICD-10-CM | POA: Insufficient documentation

## 2015-07-04 NOTE — Patient Instructions (Addendum)
PLEASE CALL ME WITH ANY PROBLEMS OR QUESTIONS (#[phone]).  HAVE A GOOD DAY!     ANY CHANGES IN NARCOTICS NEED TO GO THROUGH ME OR MY NURSE PRACTIONER!!!!

## 2015-07-04 NOTE — Progress Notes (Signed)
Subjective:    Patient ID: Jason [name], male    DOB: [birth year], 29 y.o.   MRN: [id number]  [name] is here in follow up of his cervical spinal cord injury ans associated neurological and functional deficits. He has been back in the hospital on more than one occasions with complicatoins related to this wound, breathing, etc. He currently has a #6 trach and the plan is to keep this in long term given his rsik factors. He did see pulmonary this week who said we could look at an MBS.  Pain medications have been jumbled and the regimen confusing. He has been given one regimen by the hospital, one regimen by me, and another regimen through one of his outpatient providers. The mother ended up filling multiple rx's for the fentanyl patch as well as his oxycodone. When I last saw his mother, i was willing to rx the 81mcg fentanyl patch along with the 20mg  oyxcodone IR for brekathrough pain, q6 prn.   He now has a full time care assistant who accompanied he and mother to clinic today.   Pain Inventory Average Pain 8 Pain Right Now 4 My pain is stabbing and aching  In the last 24 hours, has pain interfered with the following? General activity 3 Relation with others 8 Enjoyment of life 9 What TIME of day is your pain at its worst? morning Sleep (in general) Good  Pain is worse with: other Pain improves with: medication and injections Relief from Meds: 7  Mobility ability to climb steps?  no do you drive?  no use a wheelchair needs help with transfers  Function disabled: date disabled . I need assistance with the following:  feeding, dressing, bathing, toileting, meal prep, household duties and shopping Do you have any goals in this area?  yes  Neuro/Psych bladder control problems bowel control problems weakness numbness tremor tingling spasms anxiety  Prior Studies Any changes since last visit?  yes  Physicians involved in your care Any changes since last visit?   no   Family History  Problem Relation Age of Onset  . Diabetes Father   . Hypertension Father   . Asthma      fhx  . Hypertension      fhx  . Stroke      fhx  . Heart disease Mother    Social History   Social History  . Marital Status: Single    Spouse Name: N/A  . Number of Children: 0  . Years of Education: 11   Occupational History  . Disability    Social History Main Topics  . Smoking status: Former Smoker -- 1.00 packs/day for 12 years    Types: Cigars, Cigarettes    Quit date: 09/07/2014  . Smokeless tobacco: Never Used  . Alcohol Use: No  . Drug Use: No  . Sexual Activity: Not Currently   Other Topics Concern  . None   Social History Narrative   Patient lives at home with mother father.    Patient has 2 children.    Patient is single.    Patient was left hand but after stroke patient is now right handed.    Patient has  11th grade education.    Past Surgical History  Procedure Laterality Date  . Tonsillectomy    . Multiple extractions with alveoloplasty N/A 08/03/2014    Procedure: MULTIPLE EXTRACTIONS;  Surgeon: Gae Bon, DDS;  Location: [place];  Service: Oral Surgery;  Laterality: N/A;  .  Tee without cardioversion N/A 08/17/2014    Procedure: TRANSESOPHAGEAL ECHOCARDIOGRAM (TEE);  Surgeon: Dorothy Spark, MD;  Location[facility];  Service: Cardiovascular;  Laterality: N/A;  . Debridment of decubitus ulcer N/A 10/04/2014    Procedure: DEBRIDMENT OF DECUBITUS ULCER;  Surgeon: Georganna Skeans, MD;  Location: [place];  Service: General;  Laterality: N/A;  . Laparoscopic diverted colostomy N/A 10/12/2014    Procedure: LAPAROSCOPIC DIVERTING COLOSTOMY;  Surgeon: Donnie Mesa, MD;  Location: [place];  Service: General;  Laterality: N/A;  . Insertion of suprapubic catheter N/A 10/12/2014    Procedure: INSERTION OF SUPRAPUBIC CATHETER;  Surgeon: Reece Packer, MD;  Location: [place];  Service: Urology;  Laterality: N/A;  . Incision and drainage of  wound N/A 11/12/2014    Procedure: IRRIGATION AND DEBRIDEMENT OF WOUNDS WITH BONE BIOPSY AND SURGICAL PREP ;  Surgeon: Theodoro Kos, DO;  Location: [place];  Service: Plastics;  Laterality: N/A;  . Application of a-cell of back N/A 11/12/2014    Procedure: APPLICATION A CELL AND VAC ;  Surgeon: Theodoro Kos, DO;  Location: [place];  Service: Plastics;  Laterality: N/A;  . Incision and drainage of wound N/A 11/18/2014    Procedure: IRRIGATION AND DEBRIDEMENT OF SACRAL ULCER ONLY WITH PLACEMENT OF A CELL AND VAC/ DRESSING CHANGE TO UPPER BACK AREA.;  Surgeon: Theodoro Kos, DO;  Location: [place];  Service: Plastics;  Laterality: N/A;  . Incision and drainage of wound N/A 11/25/2014    Procedure: IRRIGATION AND DEBRIDEMENT OF SACRAL ULCER AND BACK BURN WITH PLACEMENT OF A-CELL;  Surgeon: Theodoro Kos, DO;  Location: [place];  Service: Plastics;  Laterality: N/A;  . Minor application of wound vac N/A 11/25/2014    Procedure:  WOUND VAC CHANGE;  Surgeon: Theodoro Kos, DO;  Location: [place];  Service: Plastics;  Laterality: N/A;  . Incision and drainage of wound Right 12/08/2014    Procedure: IRRIGATION AND DEBRIDEMENT SACRAL WOUND AND RIGHT ISCHIAL WOUND ;  Surgeon: Theodoro Kos, DO;  Location: [place];  Service: Plastics;  Laterality: Right;  . Application of a-cell of back N/A 12/08/2014    Procedure: APPLICATION OF A-CELL AND WOUND VAC ;  Surgeon: Theodoro Kos, DO;  Location: [place];  Service: Plastics;  Laterality: N/A;  . Tee without cardioversion N/A 05/05/2015    Procedure: TRANSESOPHAGEAL ECHOCARDIOGRAM (TEE);  Surgeon: Lelon Perla, MD;  Location: [place];  Service: Cardiovascular;  Laterality: N/A;  . Incision and drainage of wound Bilateral 05/05/2015    Procedure: IRRIGATION AND DEBRIDEMENT SACRAL ULCER;  Surgeon: Theodoro Kos, DO;  Location: [place];  Service: Plastics;  Laterality: Bilateral;  . Hematoma evacuation N/A 05/05/2015    Procedure: EVACUATION HEMATOMA bedside procedure;  Surgeon: Theodoro Kos, DO;  Location: [place];  Service: Plastics;  Laterality: N/A;   Past Medical History  Diagnosis Date  . GERD (gastroesophageal reflux disease)   . Asthma   . Hx MRSA infection     on face  . Gastroparesis   . Diabetic neuropathy   . Seizures   . Family history of anesthesia complication     Pt mother can't have epidural procedures  . Dysrhythmia   . Pneumonia   . Arthritis   . Fibromyalgia   . 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 Type I diabetes mellitus     sees Dr. Loanne Drilling   . Syncope 02/16/2015   BP 109/73 mmHg  Pulse 64  Resp 14  SpO2 100%  Opioid Risk Score:   Fall Risk Score:  `1  Depression screen PHQ 2/9  Depression screen [facility] 2/9 04/25/2015 02/16/2015  Decreased Interest 1 0  Down, Depressed, Hopeless 1 0  PHQ - 2 Score 2 0  Altered sleeping 1 -  Tired, decreased energy 2 -  Change in appetite 2 -  Feeling bad or failure about yourself  1 -  Trouble concentrating 0 -  Moving slowly or fidgety/restless 1 -  Suicidal thoughts 0 -  PHQ-9 Score 9 -     Review of Systems  Constitutional: Positive for appetite change and unexpected weight change.       Weight loss Poor appetite  Respiratory: Positive for shortness of breath.        Respiratory infection  Skin: Positive for rash.  Neurological: Positive for tremors, weakness and numbness.       Tingling Spasms   Psychiatric/Behavioral: The patient is nervous/anxious.   All other systems reviewed and are negative.      Objective:   Physical Exam  HEENT: #6 TRACH WITH pmv Cardio: RRR and no murmur  Resp: CTA B/L and unlabored  GI: BS positive and mildly distended and tympanitic, mild tenderness  Extremity: No Edema  Skin: sacral/back wounds not assessed today  Neuro: pt is lethargic, unable to stay awake. Became unresponsive on a few occasions when he then awoke and became tearful. Abnormal Sensory absent in feet and Abnormal Motor 0/5 in LLE RLE: tr to 1/5 at knee.  ,tr 1/5 finger extension and tr hand intrinsic bilaterally. Wrist extension 4+ to 5/5 right and 3- on left, 4/5 tricep right, 2+ left, biceps 5/5. Decreased sensation below elbows bilaterally, minimal pain/LT at left hand. Poor trunk control. Posture poor.  Musc/Skel: Generalized upper thoracic and lumbar spine tenderness. He sits with poor/slumped posture in his w/c. His seat belt for w/c is about 6 inches too small (at least!).  Gen NAD. He has lost considerable weight. Is alert Psych: fairly alert, appropriate  GU: foley in place. Urine cloudy with sediment.    Assessment/Plan:  1. 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 Chronic Pain Management:   - baclofen for muscle spasms---10mg  bid and 20mg  qhs  -lyrica 100mg  tid and 200mg  qhs  -can continue with oxycodone 32mq6 prn #90  -fentanyl patch 98mcg. Was refilled -ALL NARCOTIC MEDS NEED TO COME THROUGH THIS OFFICE---PT/MOTHER UNDERSTAND 4. DM type 2 with gastroparesis: continues to be brittle---follow up per endocrine/pcp  -needs better control  5. Chronic wounds: home health continues to follow open areas  -iv abx per ID  -local care per wound care nurse.  6. Constipation/diabetic gastroparesis/neurogenic bowel. ostomy  7. Severe diabetic peripheral neuropathy: lyrica---continue 100mg  TID and 200mg  QHS  8. Pulmonary: continue with #6 trach with PMV--  -will send for MBS to assess swallowing---he wants to eat! 9. Neurogenic bladder: foley   Thirty minutes of face to face patient care time were spent during this visit. All questions were encouraged and answered. Follow up with me or NP in one month

## 2015-07-05 ENCOUNTER — Encounter: Payer: Self-pay | Admitting: Infectious Disease

## 2015-07-05 ENCOUNTER — Telehealth: Payer: Self-pay | Admitting: Endocrinology

## 2015-07-05 ENCOUNTER — Telehealth: Payer: Self-pay | Admitting: Family Medicine

## 2015-07-05 ENCOUNTER — Ambulatory Visit (INDEPENDENT_AMBULATORY_CARE_PROVIDER_SITE_OTHER): Payer: Medicaid Other | Admitting: Infectious Disease

## 2015-07-05 VITALS — BP 115/73 | HR 80 | Temp 98.1°F | Ht 67.75 in | Wt 99.0 lb

## 2015-07-05 DIAGNOSIS — E43 Unspecified severe protein-calorie malnutrition: Secondary | ICD-10-CM | POA: Diagnosis not present

## 2015-07-05 DIAGNOSIS — E1041 Type 1 diabetes mellitus with diabetic mononeuropathy: Secondary | ICD-10-CM | POA: Diagnosis not present

## 2015-07-05 DIAGNOSIS — L89154 Pressure ulcer of sacral region, stage 4: Secondary | ICD-10-CM | POA: Diagnosis not present

## 2015-07-05 DIAGNOSIS — A4101 Sepsis due to Methicillin susceptible Staphylococcus aureus: Secondary | ICD-10-CM | POA: Diagnosis not present

## 2015-07-05 DIAGNOSIS — Z23 Encounter for immunization: Secondary | ICD-10-CM

## 2015-07-05 DIAGNOSIS — R6520 Severe sepsis without septic shock: Secondary | ICD-10-CM | POA: Diagnosis not present

## 2015-07-05 DIAGNOSIS — J9611 Chronic respiratory failure with hypoxia: Secondary | ICD-10-CM

## 2015-07-05 DIAGNOSIS — R7881 Bacteremia: Secondary | ICD-10-CM

## 2015-07-05 DIAGNOSIS — IMO0002 Reserved for concepts with insufficient information to code with codable children: Secondary | ICD-10-CM

## 2015-07-05 DIAGNOSIS — G8250 Quadriplegia, unspecified: Secondary | ICD-10-CM

## 2015-07-05 DIAGNOSIS — E1040 Type 1 diabetes mellitus with diabetic neuropathy, unspecified: Secondary | ICD-10-CM

## 2015-07-05 DIAGNOSIS — E1065 Type 1 diabetes mellitus with hyperglycemia: Secondary | ICD-10-CM

## 2015-07-05 DIAGNOSIS — A419 Sepsis, unspecified organism: Secondary | ICD-10-CM

## 2015-07-05 DIAGNOSIS — A498 Other bacterial infections of unspecified site: Secondary | ICD-10-CM | POA: Diagnosis not present

## 2015-07-05 DIAGNOSIS — M869 Osteomyelitis, unspecified: Secondary | ICD-10-CM

## 2015-07-05 DIAGNOSIS — J189 Pneumonia, unspecified organism: Secondary | ICD-10-CM | POA: Diagnosis not present

## 2015-07-05 MED ORDER — GLUCAGON (RDNA) 1 MG IJ KIT: PACK | INTRAMUSCULAR | Status: AC

## 2015-07-05 MED ORDER — DOXYCYCLINE HYCLATE 100 MG PO TABS: 100.0000 mg | ORAL_TABLET | Freq: Two times a day (BID) | ORAL | Status: DC

## 2015-07-05 NOTE — Telephone Encounter (Signed)
Patient Name: Jason [name] Gender: Male DOB: Mar 02, 1986 Age: 29 Y 11 M 26 D Return Phone Number: [phone] (Primary), [phone] (Secondary) Address: City/State/Zip: [place] Client [facility] Night - Client Client Site [facility] Physician Renato Shin Contact Type Call Call Type Call Back Waiting for Nurse Caller Name Jason [name] Relationship To Patient Mother Return Phone Number [phone] (Primary) Chief Complaint Prescription Refill or Medication Request (non symptomatic) Initial Comment caller is requesting refill of routine Rx; Nurse Assessment Nurse: Jimmye Norman, RN, Museum/gallery conservator Date/Time (Eastern Time): 07/04/2015 11:53:23 PM Please select the assessment type ---Refill Additional Documentation ---Caller states that her son needs a refill on his glucagon. He had a low blood sugar earlier and he needs the kit for through the night. His blood sugar is 104 now. Does the patient have enough medication to last until the office opens? ---Unable to obtain loaner dose from Pharmacy Does the client directives allow for assistance with medications after hours? ---Yes Was the medication filled within the last 6 months? ---No The medication was filled by a different doctor in the last 6 months. Caller advised that she would have to call the other doctors office with primary care because dr fry is not associated with endocrinology. Caller advised if she is not able to get the prescription tonight and Jerimey starts to have symptoms to call back for further assistance. caller verbalized understanding. Additional Documentation ---Caller advised to call the practice associated with Dr Sarajane Jews and if she is not able to get the prescription tonight and Daniil starts to have symptoms to call back for further assistance. caller verbalized understanding

## 2015-07-05 NOTE — Telephone Encounter (Signed)
i have sent a prescription to your pharmacy Ov is due

## 2015-07-05 NOTE — Telephone Encounter (Signed)
See note below to be advised. 

## 2015-07-05 NOTE — Telephone Encounter (Signed)
Appointment letter mailed to the pt.  

## 2015-07-05 NOTE — Progress Notes (Signed)
Subjective:    Patient ID: Jason [name], male    DOB: 1986-09-18, 29 y.o.   MRN: [id number]  HPI   30 y.o. 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 Migdalia Dk multiple courses of antibiotics recurrent admissions with septic shock.  I most recently saw him this past July thru August when he was admitted with worsening decubitus ulcers, osteomyelitis with MRSA bacteremia, microaerophlic bacteremia and then VAP (rx with acinetobacter and persistent fevers sp tygacil and gentamicin and then imipenem and gentamicin) His MRSA and microaerophillic strep were rx with vancomycin, zyvox and vancomycin again.   He had to have tracheostomy due to respiratory failure and had a protracted hospital course before being dc to home with a home ventilator and skilled in home nursing. He has weaned off the vent and now only on at night time. He has a special bed that relieves pressure on his back but he is NOT being turned. He claims that the wounds healing on his backside are doing well per Wound Care and Dr. Migdalia Dk. His Mom had called several weeks ago asking for his vancomycin to be extended in part because of a lesion on his scrotum that he tells me is "just a skin tear that has healed up."  He came to [place] in a motorized wheelchair with Dad and a home care RN. He did not wish to have the wounds examined today.  Past Medical History  Diagnosis Date  . GERD (gastroesophageal reflux disease)   . Asthma   . Hx MRSA infection     on face  . Gastroparesis   . Diabetic neuropathy   . Seizures   . Family history of anesthesia complication     Pt mother can't have epidural procedures  . Dysrhythmia   . Pneumonia   . Arthritis   . Fibromyalgia   . 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 Type I diabetes mellitus     sees Dr. Loanne Drilling   . Syncope 02/16/2015    Past  Surgical History  Procedure Laterality Date  . Tonsillectomy    . Multiple extractions with alveoloplasty N/A 08/03/2014    Procedure: MULTIPLE EXTRACTIONS;  Surgeon: Gae Bon, DDS;  Location: [facility];  Service: Oral Surgery;  Laterality: N/A;  . Tee without cardioversion N/A 08/17/2014    Procedure: TRANSESOPHAGEAL ECHOCARDIOGRAM (TEE);  Surgeon: Dorothy Spark, MD;  Location: [facility] ENDOSCOPY;  Service: Cardiovascular;  Laterality: N/A;  . Debridment of decubitus ulcer N/A 10/04/2014    Procedure: DEBRIDMENT OF DECUBITUS ULCER;  Surgeon: Georganna Skeans, MD;  Location: [place];  Service: General;  Laterality: N/A;  . Laparoscopic diverted colostomy N/A 10/12/2014    Procedure: LAPAROSCOPIC DIVERTING COLOSTOMY;  Surgeon: Donnie Mesa, MD;  Location: [place];  Service: General;  Laterality: N/A;  . Insertion of suprapubic catheter N/A 10/12/2014    Procedure: INSERTION OF SUPRAPUBIC CATHETER;  Surgeon: Reece Packer, MD;  Location: [place];  Service: Urology;  Laterality: N/A;  . Incision and drainage of wound N/A 11/12/2014    Procedure: IRRIGATION AND DEBRIDEMENT OF WOUNDS WITH BONE BIOPSY AND SURGICAL PREP ;  Surgeon: Theodoro Kos, DO;  Location: [place];  Service: Plastics;  Laterality: N/A;  . Application of a-cell of back N/A 11/12/2014    Procedure: APPLICATION A CELL AND VAC ;  Surgeon: Theodoro Kos, DO;  Location: [place];  Service: Clinical cytogeneticist;  Laterality: N/A;  . Incision and drainage of wound N/A 11/18/2014    Procedure: IRRIGATION AND DEBRIDEMENT OF SACRAL ULCER ONLY WITH PLACEMENT OF A CELL AND VAC/ DRESSING CHANGE TO UPPER BACK AREA.;  Surgeon: Theodoro Kos, DO;  Location: [place];  Service: Plastics;  Laterality: N/A;  . Incision and drainage of wound N/A 11/25/2014    Procedure: IRRIGATION AND DEBRIDEMENT OF SACRAL ULCER AND BACK BURN WITH PLACEMENT OF A-CELL;  Surgeon: Theodoro Kos, DO;  Location: [place];  Service: Plastics;  Laterality: N/A;  . Minor application of wound vac N/A 11/25/2014      Procedure:  WOUND VAC CHANGE;  Surgeon: Theodoro Kos, DO;  Location: [place];  Service: Plastics;  Laterality: N/A;  . Incision and drainage of wound Right 12/08/2014    Procedure: IRRIGATION AND DEBRIDEMENT SACRAL WOUND AND RIGHT ISCHIAL WOUND ;  Surgeon: Theodoro Kos, DO;  Location: [place];  Service: Plastics;  Laterality: Right;  . Application of a-cell of back N/A 12/08/2014    Procedure: APPLICATION OF A-CELL AND WOUND VAC ;  Surgeon: Theodoro Kos, DO;  Location: [place];  Service: Plastics;  Laterality: N/A;  . Tee without cardioversion N/A 05/05/2015    Procedure: TRANSESOPHAGEAL ECHOCARDIOGRAM (TEE);  Surgeon: Lelon Perla, MD;  Location: [place];  Service: Cardiovascular;  Laterality: N/A;  . Incision and drainage of wound Bilateral 05/05/2015    Procedure: IRRIGATION AND DEBRIDEMENT SACRAL ULCER;  Surgeon: Theodoro Kos, DO;  Location: [place];  Service: Plastics;  Laterality: Bilateral;  . Hematoma evacuation N/A 05/05/2015    Procedure: EVACUATION HEMATOMA bedside procedure;  Surgeon: Theodoro Kos, DO;  Location: [place];  Service: Plastics;  Laterality: N/A;    Family History  Problem Relation Age of Onset  . Diabetes Father   . Hypertension Father   . Asthma      fhx  . Hypertension      fhx  . Stroke      fhx  . Heart disease Mother       Social History   Social History  . Marital Status: Single    Spouse Name: N/A  . Number of Children: 0  . Years of Education: 11   Occupational History  . Disability    Social History Main Topics  . Smoking status: Former Smoker -- 1.00 packs/day for 12 years    Types: Cigars, Cigarettes    Quit date: 09/07/2014  . Smokeless tobacco: Never Used  . Alcohol Use: No  . Drug Use: No  . Sexual Activity: Not Currently   Other Topics Concern  . None   Social History Narrative   Patient lives at home with mother father.    Patient has 2 children.    Patient is single.    Patient was left hand but after stroke patient is  now right handed.    Patient has  11th grade education.     Allergies  Allergen Reactions  . Cefuroxime Axetil Anaphylaxis  . Ertapenem Other (See Comments)    Rash and confusion-->tolerated Imipenem   . Morphine And Related Other (See Comments)    Changed mental status, confusion, headache, visual hallucination  . Penicillins Anaphylaxis and Other (See Comments)    ?can take amoxicillin? Tolerated Imipenem   . Sulfa Antibiotics Anaphylaxis, Shortness Of Breath and Other (See Comments)  . Tessalon [Benzonatate] Anaphylaxis  . Shellfish Allergy Itching and Other (See Comments)    Took benadryl to alleviate reaction  . Miripirium  Rash and Other (See Comments)    Change in mental status     Current outpatient prescriptions:  .  ACCU-CHEK SMARTVIEW test strip, Use to check blood sugar 4 times daily., Disp: 200 each, Rfl: 1 .  acetaminophen (TYLENOL) 325 MG tablet, Take 2 tablets (650 mg total) by mouth every 6 (six) hours as needed for mild pain, moderate pain, fever or headache., Disp: , Rfl:  .  albuterol (PROVENTIL) (2.5 MG/3ML) 0.083% nebulizer solution, Take 3 mLs (2.5 mg total) by nebulization every 4 (four) hours as needed for wheezing or shortness of breath., Disp: 75 mL, Rfl: 3 .  baclofen (LIORESAL) 10 MG tablet, Take 1-2 tablets (10-20 mg total) by mouth 3 (three) times daily. Takes 61m in morning and lunchtime and takes 238mat bedtime, Disp: 120 each, Rfl: 5 .  collagenase (SANTYL) ointment, Apply 1 application topically daily. Apply to back and foot wound every M/W/F, Disp: 90 g, Rfl: 5 .  doxycycline (VIBRA-TABS) 100 MG tablet, Take 1 tablet (100 mg total) by mouth 2 (two) times daily., Disp: 60 tablet, Rfl: 2 .  fentaNYL (DURAGESIC - DOSED MCG/HR) 50 MCG/HR, Place 1 patch (50 mcg total) onto the skin every 3 (three) days., Disp: 10 patch, Rfl: 0 .  glucagon (GLUCAGON EMERGENCY) 1 MG injection, INJECT INTO MUSCLE ONCE AS NEEDED, Disp: 1 kit, Rfl: 0 .  insulin aspart  (NOVOLOG) 100 UNIT/ML injection, Inject 5 Units into the skin 3 (three) times daily with meals., Disp: 10 mL, Rfl: 11 .  insulin glargine (LANTUS) 100 UNIT/ML injection, Inject 0.05 mLs (5 Units total) into the skin at bedtime., Disp: 10 mL, Rfl: 11 .  loratadine (CLARITIN) 10 MG tablet, Place 1 tablet (10 mg total) into feeding tube at bedtime., Disp: , Rfl:  .  LORazepam (ATIVAN) 1 MG tablet, Take 1 tablet (1 mg total) by mouth every 6 (six) hours as needed for anxiety. (Patient taking differently: Take 0.5 mg by mouth every 8 (eight) hours as needed for anxiety. ), Disp: 120 tablet, Rfl: 2 .  midodrine (PROAMATINE) 10 MG tablet, Take 2 tablets (20 mg total) by mouth 3 (three) times daily with meals. (Patient taking differently: Take 30 mg by mouth 3 (three) times daily with meals. ), Disp: 180 tablet, Rfl: 6 .  mirtazapine (REMERON) 7.5 MG tablet, Place 1 tablet (7.5 mg total) into feeding tube at bedtime., Disp: 30 tablet, Rfl: 6 .  Multiple Vitamin (MULTIVITAMIN WITH MINERALS) TABS tablet, Place 1 tablet into feeding tube daily., Disp: , Rfl:  .  Nutritional Supplements (FEEDING SUPPLEMENT, VITAL AF 1.2 CAL,) LIQD, Place 1,000 mLs into feeding tube continuous., Disp: 1000 mL, Rfl: 11 .  ondansetron (ZOFRAN ODT) 8 MG disintegrating tablet, Place 1 tablet (8 mg total) into feeding tube every 8 (eight) hours as needed for nausea or vomiting., Disp: 60 tablet, Rfl: 5 .  Oxycodone HCl 20 MG TABS, Take 1 tablet (20 mg total) by mouth every 6 (six) hours as needed. With dressing changes and for break through pain but NO MORE THAN 4 PER DAY, Disp: 120 tablet, Rfl: 0 .  pantoprazole sodium (PROTONIX) 40 mg/20 mL PACK, Place 20 mLs (40 mg total) into feeding tube daily., Disp: 30 each, Rfl: 6 .  pregabalin (LYRICA) 100 MG capsule, Place 1 capsule (100 mg total) into feeding tube 3 (three) times daily. Take 100 mg by mouth in the morning, take 100 mg by mouth in the afternoon, take 100 mg by mouth in the  evening and take 200 mg by mouth at bedtime., Disp: 150 capsule, Rfl: 4    Review of Systems  Constitutional: Negative for fever, chills, diaphoresis, activity change and appetite change.  HENT: Negative for congestion, sore throat and trouble swallowing.   Respiratory: Negative for cough, shortness of breath, wheezing and stridor.   Cardiovascular: Negative for chest pain, palpitations and leg swelling.  Gastrointestinal: Negative for abdominal distention.  Genitourinary: Negative for dysuria, hematuria, flank pain and difficulty urinating.  Musculoskeletal: Negative for back pain and arthralgias.  Skin: Positive for wound.  Neurological: Positive for tremors. Negative for dizziness.  Hematological: Negative for adenopathy.  Psychiatric/Behavioral: Positive for confusion. Negative for behavioral problems, sleep disturbance, dysphoric mood, decreased concentration and agitation.       Objective:   Physical Exam  HENT:  Head: Normocephalic and atraumatic.  Eyes: Conjunctivae and EOM are normal.  Neck: Normal range of motion. Neck supple.  Cardiovascular: Normal rate, regular rhythm and normal heart sounds.  Exam reveals no gallop and no friction rub.   No murmur heard. Pulmonary/Chest: Effort normal and breath sounds normal. No respiratory distress. He has no wheezes. He has no rales.  Abdominal: Soft. He exhibits no distension.  Neurological: He is alert.  quadraplegic   Skin: Skin is warm and dry.  Psychiatric: He has a normal mood and affect. His speech is normal and behavior is normal. Thought content normal.  Nursing note and vitals reviewed.   I DID not examine his decubitus ulcers.   His suprapubic catheter bag with sediment that was "typical" for him per Mom      Assessment & Plan:   80 yo with paraplegia, sacral decubitus ulcer, chronic pelvic osteo, recurrent UTIs, recurrent admissions for sepsis now admission with MRSA and microaerophillic strep bacteremia,  pelvic osteomyelitis VDRF sp tracheostomy, VAP with Acinetobacter now at home with ventilator at night and sp IV abx  #1 MRSA and microaerophilic streptococcal bacteremia and osteomyelitis: sp 8 weeks +. I will place him on doxycyline in case there is residual osteomyelitis that needs further abx and to assuage anxiety of Mom about him coming off abx at this time  #2 VAP with acinetobacter: cured  #3 Large decubitus ulcers with pelvic osteomyelits: needs vigilant wound care, nutrition and I would think he still needs turning despite his special mattress  I spent greater than 25 minutes with the patient including greater than 50% of time in face to face counsel of the patient re his chronic decubitus ulcers osteomyelitis, bacteremias and VAP and in coordination of their care.

## 2015-07-06 ENCOUNTER — Telehealth: Payer: Self-pay | Admitting: *Deleted

## 2015-07-06 NOTE — Telephone Encounter (Signed)
[facility] - Client [place] Patient Name: Jason [name] Gender: Male DOB: [birth year] Age: 29 Y 11 M 26 D Return Phone Number: [phone] (Primary) Address: City/State/Zip: [place] [address] Client [facility] - Client Client [facility] - Night Contact Type Call Call Type Triage / Clinical Relationship To Patient Mother Return Phone Number [phone] (Primary) Chief Complaint Medication Question (non symptomatic) Initial Comment Caller states that her son needs a prescription refilled Nurse Assessment Nurse: Rock Nephew, RN, Marliss Coots Date/Time (Eastern Time): 07/05/2015 12:49:19 AM Please select the assessment type ---Refill Additional Documentation ---Caller states that she needs a glucagon kit refill. Her son is diabetic and a quadriplegic and his blood sugar is low and she wants to ensure that it doesn't go any lower and he doesn't end up in the ED tonight. Last time she filled this was 04/09/2015 RX [id number] at [facility] on [address] in [place]. [phone]  Does the patient have enough medication to last until the office opens? ---No Additional Documentation ---Advised caller that nurse would need to contact the pharmacy and verify that the prescription was refilled recently and if so then nurse would authorize a refill of the medication. Guidelines Guideline Title Affirmed Question Affirmed Notes Nurse Date/Time (Eastern Time) Disp. Time Eilene Ghazi Time) Disposition Final User 07/05/2015 12:58:34 AM Pharmacy Call Rock Nephew, RN, Marliss Coots Reason: [place] and spoke with Chrissy the pharmacist. Verified a prescription was refilled recently on 06/10/15. Authorized a refill for one glucagon kit. 07/05/2015 1:02:10 AM Clinical Call Yes Rock Nephew, RN, Marliss Coots After Care Instructions Given Call Event Type User Date /  Time Description Verbal Orders/Maintenance Medications Medication Refill Route Dosage Regime Duration Admin Instructions User Name [place] As Needed no refills [place], RN, Marliss Coots

## 2015-07-06 NOTE — Telephone Encounter (Signed)
Script was sent to pharmacy by [facility].

## 2015-07-06 NOTE — Telephone Encounter (Signed)
Wynelle Link [facility] RN, received Dr. Derek Mound written order to pull PICC after patient's visit on 9/27.  She drew weekly labs on 9/28 then pulled the PICC. [name], RN

## 2015-07-08 ENCOUNTER — Telehealth: Payer: Self-pay | Admitting: Family Medicine

## 2015-07-08 MED ORDER — CIPROFLOXACIN HCL 500 MG PO TABS: 500.0000 mg | ORAL_TABLET | Freq: Two times a day (BID) | ORAL | Status: DC

## 2015-07-08 MED ORDER — INSULIN SYRINGES (DISPOSABLE) U-100 1 ML MISC
Status: DC
Start: 2015-07-08 — End: 2016-01-21

## 2015-07-08 NOTE — Telephone Encounter (Signed)
Please advise 

## 2015-07-08 NOTE — Telephone Encounter (Signed)
I sent both scripts e-scribe to [facility], spoke with home nurse Tameka. She will notify pt about scripts that were sent.

## 2015-07-08 NOTE — Telephone Encounter (Signed)
Call in Cipro 500 mg bid for 10 days, also refill insulin needles

## 2015-07-08 NOTE — Telephone Encounter (Signed)
[name] - Client [facility] Patient Name: Jason [name] DOB: 06-Jun-1986 Initial Comment Caller states pt may have UTI, has low grade temp, hr up to 100. Has superpubic catheter that is having problems with drainage. Urine is cloudy and is having retention. Also needs new rx for insulin needles. Nurse Assessment Nurse: Mechele Dawley, RN, Amy Date/Time Eilene Ghazi Time): 07/08/2015 10:00:20 AM Confirm and document reason for call. If symptomatic, describe symptoms. ---CLOUDY URINE WITH SUPR-PUBIC CATH. PROBLEMS WITH CATH DRAINING. FEVER OF 102 STARTED WITHIN THE PAST 24 HOURS. CALLER IS THE DAY SHIFT NURSE. Has the patient traveled out of the country within the last 30 days? ---Not Applicable Does the patient require triage? ---Yes Related visit to physician within the last 2 weeks? ---No Does the PT have any chronic conditions? (i.e. diabetes, asthma, etc.) ---Yes List chronic conditions. ---URINE, RESP FAILURE, ANEMIA, ENDOCARDITIS, DIABETIC Guidelines Guideline Title Affirmed Question Affirmed Notes Urinary Catheter Symptoms and Questions [1] Cloudy urine lasts > 24 hours AND [2] not cleared by increasing fluid intake Final Disposition User See Physician within [place], Therapist, sports, Amy Comments CALLER IS THE DAY SHIFT NURSE AND [place]. 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 INFORMED HER THAT WOULD SEND THE INFORMATION OVER TO THE OFFICE AND THEY WOULD BE IN CONTACT WITH HER. SHE STATES THAT HE IS ALSO IN NEED OF INSULIN NEEDLES TO DRAW UP HIS INSULIN. HE USES THE REGULAR ORANGE INSULIN NEEDLES. Disagree/Comply: Comply

## 2015-07-10 ENCOUNTER — Telehealth: Payer: Self-pay | Admitting: Family Medicine

## 2015-07-10 ENCOUNTER — Encounter (HOSPITAL_COMMUNITY): Payer: Self-pay | Admitting: Emergency Medicine

## 2015-07-10 ENCOUNTER — Inpatient Hospital Stay (HOSPITAL_COMMUNITY)
Admission: EM | Admit: 2015-07-10 | Discharge: 2015-08-16 | DRG: 673 | Disposition: A | Discharge status: Home Health | Payer: Medicaid Other | Attending: Internal Medicine | Admitting: Internal Medicine

## 2015-07-10 ENCOUNTER — Emergency Department (HOSPITAL_COMMUNITY): Payer: Medicaid Other

## 2015-07-10 DIAGNOSIS — R4020 Unspecified coma: Secondary | ICD-10-CM | POA: Diagnosis not present

## 2015-07-10 DIAGNOSIS — Z515 Encounter for palliative care: Secondary | ICD-10-CM | POA: Diagnosis not present

## 2015-07-10 DIAGNOSIS — J90 Pleural effusion, not elsewhere classified: Secondary | ICD-10-CM | POA: Diagnosis present

## 2015-07-10 DIAGNOSIS — Z8614 Personal history of Methicillin resistant Staphylococcus aureus infection: Secondary | ICD-10-CM | POA: Diagnosis not present

## 2015-07-10 DIAGNOSIS — Z930 Tracheostomy status: Secondary | ICD-10-CM | POA: Diagnosis present

## 2015-07-10 DIAGNOSIS — A498 Other bacterial infections of unspecified site: Secondary | ICD-10-CM | POA: Diagnosis not present

## 2015-07-10 DIAGNOSIS — Z882 Allergy status to sulfonamides status: Secondary | ICD-10-CM | POA: Diagnosis not present

## 2015-07-10 DIAGNOSIS — T83511A Infection and inflammatory reaction due to indwelling urethral catheter, initial encounter: Secondary | ICD-10-CM | POA: Diagnosis not present

## 2015-07-10 DIAGNOSIS — G8220 Paraplegia, unspecified: Secondary | ICD-10-CM | POA: Diagnosis present

## 2015-07-10 DIAGNOSIS — Z792 Long term (current) use of antibiotics: Secondary | ICD-10-CM

## 2015-07-10 DIAGNOSIS — Z91013 Allergy to seafood: Secondary | ICD-10-CM

## 2015-07-10 DIAGNOSIS — D649 Anemia, unspecified: Secondary | ICD-10-CM | POA: Diagnosis present

## 2015-07-10 DIAGNOSIS — T68XXXA Hypothermia, initial encounter: Secondary | ICD-10-CM | POA: Diagnosis present

## 2015-07-10 DIAGNOSIS — Z8619 Personal history of other infectious and parasitic diseases: Secondary | ICD-10-CM | POA: Diagnosis not present

## 2015-07-10 DIAGNOSIS — Z95828 Presence of other vascular implants and grafts: Secondary | ICD-10-CM | POA: Diagnosis not present

## 2015-07-10 DIAGNOSIS — Z79899 Other long term (current) drug therapy: Secondary | ICD-10-CM

## 2015-07-10 DIAGNOSIS — N390 Urinary tract infection, site not specified: Secondary | ICD-10-CM | POA: Diagnosis present

## 2015-07-10 DIAGNOSIS — Z87440 Personal history of urinary (tract) infections: Secondary | ICD-10-CM | POA: Diagnosis not present

## 2015-07-10 DIAGNOSIS — E43 Unspecified severe protein-calorie malnutrition: Secondary | ICD-10-CM | POA: Diagnosis present

## 2015-07-10 DIAGNOSIS — E440 Moderate protein-calorie malnutrition: Secondary | ICD-10-CM | POA: Diagnosis present

## 2015-07-10 DIAGNOSIS — Z888 Allergy status to other drugs, medicaments and biological substances status: Secondary | ICD-10-CM | POA: Diagnosis not present

## 2015-07-10 DIAGNOSIS — K219 Gastro-esophageal reflux disease without esophagitis: Secondary | ICD-10-CM | POA: Diagnosis present

## 2015-07-10 DIAGNOSIS — R4182 Altered mental status, unspecified: Secondary | ICD-10-CM | POA: Diagnosis present

## 2015-07-10 DIAGNOSIS — R410 Disorientation, unspecified: Secondary | ICD-10-CM | POA: Diagnosis not present

## 2015-07-10 DIAGNOSIS — N319 Neuromuscular dysfunction of bladder, unspecified: Secondary | ICD-10-CM | POA: Diagnosis present

## 2015-07-10 DIAGNOSIS — N179 Acute kidney failure, unspecified: Secondary | ICD-10-CM | POA: Diagnosis present

## 2015-07-10 DIAGNOSIS — Z1624 Resistance to multiple antibiotics: Secondary | ICD-10-CM

## 2015-07-10 DIAGNOSIS — Z87828 Personal history of other (healed) physical injury and trauma: Secondary | ICD-10-CM | POA: Diagnosis present

## 2015-07-10 DIAGNOSIS — A4152 Sepsis due to Pseudomonas: Secondary | ICD-10-CM | POA: Diagnosis present

## 2015-07-10 DIAGNOSIS — A047 Enterocolitis due to Clostridium difficile: Secondary | ICD-10-CM | POA: Diagnosis not present

## 2015-07-10 DIAGNOSIS — Z886 Allergy status to analgesic agent status: Secondary | ICD-10-CM

## 2015-07-10 DIAGNOSIS — K3184 Gastroparesis: Secondary | ICD-10-CM | POA: Diagnosis present

## 2015-07-10 DIAGNOSIS — R509 Fever, unspecified: Secondary | ICD-10-CM | POA: Diagnosis not present

## 2015-07-10 DIAGNOSIS — L89624 Pressure ulcer of left heel, stage 4: Secondary | ICD-10-CM | POA: Diagnosis present

## 2015-07-10 DIAGNOSIS — L89154 Pressure ulcer of sacral region, stage 4: Secondary | ICD-10-CM | POA: Diagnosis present

## 2015-07-10 DIAGNOSIS — Z794 Long term (current) use of insulin: Secondary | ICD-10-CM | POA: Diagnosis not present

## 2015-07-10 DIAGNOSIS — L89224 Pressure ulcer of left hip, stage 4: Secondary | ICD-10-CM | POA: Diagnosis present

## 2015-07-10 DIAGNOSIS — Z933 Colostomy status: Secondary | ICD-10-CM | POA: Diagnosis not present

## 2015-07-10 DIAGNOSIS — J9611 Chronic respiratory failure with hypoxia: Secondary | ICD-10-CM | POA: Diagnosis present

## 2015-07-10 DIAGNOSIS — J9621 Acute and chronic respiratory failure with hypoxia: Secondary | ICD-10-CM | POA: Diagnosis not present

## 2015-07-10 DIAGNOSIS — Z8669 Personal history of other diseases of the nervous system and sense organs: Secondary | ICD-10-CM | POA: Diagnosis not present

## 2015-07-10 DIAGNOSIS — E861 Hypovolemia: Secondary | ICD-10-CM | POA: Diagnosis present

## 2015-07-10 DIAGNOSIS — G9340 Encephalopathy, unspecified: Secondary | ICD-10-CM | POA: Diagnosis not present

## 2015-07-10 DIAGNOSIS — E871 Hypo-osmolality and hyponatremia: Secondary | ICD-10-CM | POA: Diagnosis present

## 2015-07-10 DIAGNOSIS — B965 Pseudomonas (aeruginosa) (mallei) (pseudomallei) as the cause of diseases classified elsewhere: Secondary | ICD-10-CM | POA: Diagnosis not present

## 2015-07-10 DIAGNOSIS — A499 Bacterial infection, unspecified: Secondary | ICD-10-CM | POA: Insufficient documentation

## 2015-07-10 DIAGNOSIS — E1042 Type 1 diabetes mellitus with diabetic polyneuropathy: Secondary | ICD-10-CM | POA: Diagnosis present

## 2015-07-10 DIAGNOSIS — Z8739 Personal history of other diseases of the musculoskeletal system and connective tissue: Secondary | ICD-10-CM | POA: Diagnosis not present

## 2015-07-10 DIAGNOSIS — Z87891 Personal history of nicotine dependence: Secondary | ICD-10-CM | POA: Diagnosis not present

## 2015-07-10 DIAGNOSIS — R8290 Unspecified abnormal findings in urine: Secondary | ICD-10-CM | POA: Diagnosis not present

## 2015-07-10 DIAGNOSIS — J45909 Unspecified asthma, uncomplicated: Secondary | ICD-10-CM | POA: Diagnosis present

## 2015-07-10 DIAGNOSIS — R6520 Severe sepsis without septic shock: Secondary | ICD-10-CM | POA: Diagnosis present

## 2015-07-10 DIAGNOSIS — L89894 Pressure ulcer of other site, stage 4: Secondary | ICD-10-CM | POA: Diagnosis not present

## 2015-07-10 DIAGNOSIS — Z86711 Personal history of pulmonary embolism: Secondary | ICD-10-CM | POA: Diagnosis not present

## 2015-07-10 DIAGNOSIS — Z880 Allergy status to penicillin: Secondary | ICD-10-CM | POA: Diagnosis not present

## 2015-07-10 DIAGNOSIS — J189 Pneumonia, unspecified organism: Secondary | ICD-10-CM

## 2015-07-10 DIAGNOSIS — M8668 Other chronic osteomyelitis, other site: Secondary | ICD-10-CM | POA: Diagnosis not present

## 2015-07-10 DIAGNOSIS — IMO0002 Reserved for concepts with insufficient information to code with codable children: Secondary | ICD-10-CM | POA: Diagnosis present

## 2015-07-10 DIAGNOSIS — G9341 Metabolic encephalopathy: Secondary | ICD-10-CM | POA: Diagnosis present

## 2015-07-10 DIAGNOSIS — J9620 Acute and chronic respiratory failure, unspecified whether with hypoxia or hypercapnia: Secondary | ICD-10-CM | POA: Diagnosis not present

## 2015-07-10 DIAGNOSIS — L89319 Pressure ulcer of right buttock, unspecified stage: Secondary | ICD-10-CM | POA: Diagnosis not present

## 2015-07-10 DIAGNOSIS — Z993 Dependence on wheelchair: Secondary | ICD-10-CM | POA: Diagnosis not present

## 2015-07-10 DIAGNOSIS — Z1612 Extended spectrum beta lactamase (ESBL) resistance: Secondary | ICD-10-CM | POA: Diagnosis present

## 2015-07-10 DIAGNOSIS — M8660 Other chronic osteomyelitis, unspecified site: Secondary | ICD-10-CM | POA: Diagnosis present

## 2015-07-10 DIAGNOSIS — L89159 Pressure ulcer of sacral region, unspecified stage: Secondary | ICD-10-CM | POA: Diagnosis present

## 2015-07-10 DIAGNOSIS — Z885 Allergy status to narcotic agent status: Secondary | ICD-10-CM | POA: Diagnosis not present

## 2015-07-10 DIAGNOSIS — E10649 Type 1 diabetes mellitus with hypoglycemia without coma: Secondary | ICD-10-CM | POA: Diagnosis not present

## 2015-07-10 DIAGNOSIS — M1990 Unspecified osteoarthritis, unspecified site: Secondary | ICD-10-CM | POA: Diagnosis present

## 2015-07-10 DIAGNOSIS — Z79891 Long term (current) use of opiate analgesic: Secondary | ICD-10-CM

## 2015-07-10 DIAGNOSIS — T83518A Infection and inflammatory reaction due to other urinary catheter, initial encounter: Principal | ICD-10-CM | POA: Diagnosis present

## 2015-07-10 DIAGNOSIS — L89324 Pressure ulcer of left buttock, stage 4: Secondary | ICD-10-CM | POA: Diagnosis present

## 2015-07-10 DIAGNOSIS — E1040 Type 1 diabetes mellitus with diabetic neuropathy, unspecified: Secondary | ICD-10-CM | POA: Diagnosis present

## 2015-07-10 DIAGNOSIS — D72829 Elevated white blood cell count, unspecified: Secondary | ICD-10-CM | POA: Diagnosis not present

## 2015-07-10 DIAGNOSIS — M797 Fibromyalgia: Secondary | ICD-10-CM | POA: Diagnosis present

## 2015-07-10 DIAGNOSIS — A419 Sepsis, unspecified organism: Secondary | ICD-10-CM | POA: Diagnosis present

## 2015-07-10 DIAGNOSIS — E1065 Type 1 diabetes mellitus with hyperglycemia: Secondary | ICD-10-CM | POA: Diagnosis present

## 2015-07-10 DIAGNOSIS — M869 Osteomyelitis, unspecified: Secondary | ICD-10-CM

## 2015-07-10 DIAGNOSIS — B9689 Other specified bacterial agents as the cause of diseases classified elsewhere: Secondary | ICD-10-CM | POA: Diagnosis not present

## 2015-07-10 DIAGNOSIS — Z960 Presence of urogenital implants: Secondary | ICD-10-CM | POA: Diagnosis not present

## 2015-07-10 DIAGNOSIS — L89314 Pressure ulcer of right buttock, stage 4: Secondary | ICD-10-CM | POA: Diagnosis present

## 2015-07-10 LAB — I-STAT CHEM 8, ED
BUN: 79 mg/dL — ABNORMAL HIGH (ref 6–20)
CALCIUM ION: 1.45 mmol/L — AB (ref 1.12–1.23)
Chloride: 105 mmol/L (ref 101–111)
Creatinine, Ser: 1.5 mg/dL — ABNORMAL HIGH (ref 0.61–1.24)
GLUCOSE: 301 mg/dL — AB (ref 65–99)
HCT: 31 % — ABNORMAL LOW (ref 39.0–52.0)
HEMOGLOBIN: 10.5 g/dL — AB (ref 13.0–17.0)
Potassium: 5.1 mmol/L (ref 3.5–5.1)
Sodium: 128 mmol/L — ABNORMAL LOW (ref 135–145)
TCO2: 16 mmol/L (ref 0–100)

## 2015-07-10 LAB — COMPREHENSIVE METABOLIC PANEL
ALT: 14 U/L — ABNORMAL LOW (ref 17–63)
AST: 13 U/L — AB (ref 15–41)
Albumin: 2.3 g/dL — ABNORMAL LOW (ref 3.5–5.0)
Alkaline Phosphatase: 139 U/L — ABNORMAL HIGH (ref 38–126)
Anion gap: 9 (ref 5–15)
BILIRUBIN TOTAL: 0.5 mg/dL (ref 0.3–1.2)
BUN: 71 mg/dL — AB (ref 6–20)
CO2: 15 mmol/L — ABNORMAL LOW (ref 22–32)
CREATININE: 1.43 mg/dL — AB (ref 0.61–1.24)
Calcium: 10.6 mg/dL — ABNORMAL HIGH (ref 8.9–10.3)
Chloride: 104 mmol/L (ref 101–111)
GFR calc Af Amer: >60 mL/min (ref >60)
Glucose, Bld: 289 mg/dL — ABNORMAL HIGH (ref 65–99)
POTASSIUM: 5.1 mmol/L (ref 3.5–5.1)
Sodium: 128 mmol/L — ABNORMAL LOW (ref 135–145)
TOTAL PROTEIN: 7.7 g/dL (ref 6.5–8.1)

## 2015-07-10 LAB — URINE MICROSCOPIC-ADD ON

## 2015-07-10 LAB — CBC WITH DIFFERENTIAL/PLATELET
BASOS ABS: 0 10*3/uL (ref 0.0–0.1)
BASOS PCT: 0 %
EOS PCT: 1 %
Eosinophils Absolute: 0.2 10*3/uL (ref 0.0–0.7)
HEMATOCRIT: 26.9 % — AB (ref 39.0–52.0)
Hemoglobin: 8.7 g/dL — ABNORMAL LOW (ref 13.0–17.0)
LYMPHS PCT: 8 %
Lymphs Abs: 1.6 10*3/uL (ref 0.7–4.0)
MCH: 27.3 pg (ref 26.0–34.0)
MCHC: 32.3 g/dL (ref 30.0–36.0)
MCV: 84.3 fL (ref 78.0–100.0)
MONOS PCT: 20 %
Monocytes Absolute: 4 10*3/uL — ABNORMAL HIGH (ref 0.1–1.0)
NEUTROS ABS: 14 10*3/uL — AB (ref 1.7–7.7)
Neutrophils Relative %: 71 %
Platelets: 787 10*3/uL — ABNORMAL HIGH (ref 150–400)
RBC: 3.19 MIL/uL — ABNORMAL LOW (ref 4.22–5.81)
RDW: 15.6 % — AB (ref 11.5–15.5)
WBC: 19.8 10*3/uL — ABNORMAL HIGH (ref 4.0–10.5)

## 2015-07-10 LAB — I-STAT CG4 LACTIC ACID, ED
LACTIC ACID, VENOUS: 1.23 mmol/L (ref 0.5–2.0)
LACTIC ACID, VENOUS: 1.42 mmol/L (ref 0.5–2.0)

## 2015-07-10 LAB — URINALYSIS, ROUTINE W REFLEX MICROSCOPIC
Bilirubin Urine: NEGATIVE
GLUCOSE, UA: 100 mg/dL — AB
KETONES UR: NEGATIVE mg/dL
Nitrite: NEGATIVE
Specific Gravity, Urine: 1.016 (ref 1.005–1.030)
Urobilinogen, UA: 0.2 mg/dL (ref 0.0–1.0)
pH: 5.5 (ref 5.0–8.0)

## 2015-07-10 MED ORDER — SODIUM CHLORIDE 0.9 % IV BOLUS (SEPSIS)
1000.0000 mL | Freq: Once | INTRAVENOUS | Status: AC
  Administered 2015-07-10: 1000 mL via INTRAVENOUS

## 2015-07-10 MED ORDER — SODIUM CHLORIDE 0.9 % IV SOLN
250.0000 mg | Freq: Three times a day (TID) | INTRAVENOUS | Status: DC
  Administered 2015-07-11: 250 mg via INTRAVENOUS
  Filled 2015-07-10 (×4): qty 250

## 2015-07-10 MED ORDER — SODIUM CHLORIDE 0.9 % IV SOLN
500.0000 mg | INTRAVENOUS | Status: DC
  Administered 2015-07-10 – 2015-07-16 (×6): 500 mg via INTRAVENOUS
  Filled 2015-07-10 (×7): qty 500

## 2015-07-10 NOTE — H&P (Signed)
[facility] History and Physical  Jason [name] OEV:[id number] DOB: 1985-11-02 DOA: 07/10/2015  Referring physician: Dr.Betty. PCP: [name], MD  Specialists: Dr.Vandamm.  Chief Complaint: Altered mental status.  History obtained from patient's mother.  HPI: Jason [name] is a 29 y.o. male with history of spinal cord injury status post paraplegia who was recently admitted for MRSA and microaerophilic strep bacteremia with respiratory failure requiring trach and is the patient is on vent in the nights was brought to the ER after patient was found to have a hallucination and confusion as per patient's mother. Patient was recently tapered off his IV antibiotics and was placed on doxycycline and Cipro was started 3 days ago for possible UTI. Over the last 3 days patient was noticed to have increasing confusion and hallucination as per patient's mother. In the ER patient UA shows features concerning for UTI and patient has chronic suprapubic catheter. Patient is also found to be hypothermic and was also hypotensive which improved with fluid bolus. Patient has had previous history of multiple debridement for chronic sacral decubitus which at this time looks healing. Patient also has history of chronic osteomyelitis. Patient has been admitted for possible developing sepsis. On call pulmonary critical care has been consulted and patient has been admitted for further management.  Review of Systems: As presented in the history of presenting illness, rest negative.  Past Medical History  Diagnosis Date  . GERD (gastroesophageal reflux disease)   . Asthma   . Hx MRSA infection     on face  . Gastroparesis   . Diabetic neuropathy ([place])   . Seizures ([place])   . Family history of anesthesia complication     Pt mother can't have epidural procedures  . Dysrhythmia   . Pneumonia   . Arthritis   . Fibromyalgia   . Stroke ([place]) 01/29/2014    spinal stroke ; "quadriplegic since"  . Type  I diabetes mellitus ([place])     sees Dr. Loanne Drilling   . Syncope 02/16/2015   Past Surgical History  Procedure Laterality Date  . Tonsillectomy    . Multiple extractions with alveoloplasty N/A 08/03/2014    Procedure: MULTIPLE EXTRACTIONS;  Surgeon: Gae Bon, DDS;  Location: [place];  Service: Oral Surgery;  Laterality: N/A;  . Tee without cardioversion N/A 08/17/2014    Procedure: TRANSESOPHAGEAL ECHOCARDIOGRAM (TEE);  Surgeon: Dorothy Spark, MD;  [facility] ENDOSCOPY;  Service: Cardiovascular;  Laterality: N/A;  . Debridment of decubitus ulcer N/A 10/04/2014    Procedure: DEBRIDMENT OF DECUBITUS ULCER;  Surgeon: Georganna Skeans, MD;  Location: [place];  Service: General;  Laterality: N/A;  . Laparoscopic diverted colostomy N/A 10/12/2014    Procedure: LAPAROSCOPIC DIVERTING COLOSTOMY;  Surgeon: Donnie Mesa, MD;  Location: [place];  Service: General;  Laterality: N/A;  . Insertion of suprapubic catheter N/A 10/12/2014    Procedure: INSERTION OF SUPRAPUBIC CATHETER;  Surgeon: Reece Packer, MD;  Location: [place];  Service: Urology;  Laterality: N/A;  . Incision and drainage of wound N/A 11/12/2014    Procedure: IRRIGATION AND DEBRIDEMENT OF WOUNDS WITH BONE BIOPSY AND SURGICAL PREP ;  Surgeon: Theodoro Kos, DO;  Location: [place];  Service: Plastics;  Laterality: N/A;  . Application of a-cell of back N/A 11/12/2014    Procedure: APPLICATION A CELL AND VAC ;  Surgeon: Theodoro Kos, DO;  Location: [place];  Service: Plastics;  Laterality: N/A;  . Incision and drainage of wound N/A 11/18/2014    Procedure:  IRRIGATION AND DEBRIDEMENT OF SACRAL ULCER ONLY WITH PLACEMENT OF A CELL AND VAC/ DRESSING CHANGE TO UPPER BACK AREA.;  Surgeon: Theodoro Kos, DO;  Location: [place];  Service: Plastics;  Laterality: N/A;  . Incision and drainage of wound N/A 11/25/2014    Procedure: IRRIGATION AND DEBRIDEMENT OF SACRAL ULCER AND BACK BURN WITH PLACEMENT OF A-CELL;  Surgeon: Theodoro Kos, DO;  Location: [place];   Service: Plastics;  Laterality: N/A;  . Minor application of wound vac N/A 11/25/2014    Procedure:  WOUND VAC CHANGE;  Surgeon: Theodoro Kos, DO;  Location: [place];  Service: Plastics;  Laterality: N/A;  . Incision and drainage of wound Right 12/08/2014    Procedure: IRRIGATION AND DEBRIDEMENT SACRAL WOUND AND RIGHT ISCHIAL WOUND ;  Surgeon: Theodoro Kos, DO;  Location: [place];  Service: Plastics;  Laterality: Right;  . Application of a-cell of back N/A 12/08/2014    Procedure: APPLICATION OF A-CELL AND WOUND VAC ;  Surgeon: Theodoro Kos, DO;  Location: [place];  Service: Plastics;  Laterality: N/A;  . Tee without cardioversion N/A 05/05/2015    Procedure: TRANSESOPHAGEAL ECHOCARDIOGRAM (TEE);  Surgeon: Lelon Perla, MD;  Location: [place];  Service: Cardiovascular;  Laterality: N/A;  . Incision and drainage of wound Bilateral 05/05/2015    Procedure: IRRIGATION AND DEBRIDEMENT SACRAL ULCER;  Surgeon: Theodoro Kos, DO;  Location: [place];  Service: Plastics;  Laterality: Bilateral;  . Hematoma evacuation N/A 05/05/2015    Procedure: EVACUATION HEMATOMA bedside procedure;  Surgeon: Theodoro Kos, DO;  Location: [place];  Service: Plastics;  Laterality: N/A;   Social History:  reports that he quit smoking about 10 months ago. His smoking use included Cigars and Cigarettes. He has a 12 pack-year smoking history. He has never used smokeless tobacco. He reports that he does not drink alcohol or use illicit drugs. Where does patient live home. Can patient participate in ADLs? No.  Allergies  Allergen Reactions  . Cefuroxime Axetil Anaphylaxis  . Ertapenem Other (See Comments)    Rash and confusion-->tolerated Imipenem   . Morphine And Related Other (See Comments)    Changed mental status, confusion, headache, visual hallucination  . Penicillins Anaphylaxis and Other (See Comments)    Tolerated Imipenem; no reaction to 7 day course of amoxicillin in 2015 Has patient had a PCN reaction causing  immediate rash, facial/tongue/throat swelling, SOB or lightheadedness with hypotension: Yes Has patient had a PCN reaction causing severe rash involving mucus membranes or skin necrosis: No Has patient had a PCN reaction that required hospitalization Yes Has patient had a PCN reaction occurring within the last 10 years: No If all of the above answers are "NO", then may proceed with Cephalo  . Sulfa Antibiotics Anaphylaxis, Shortness Of Breath and Other (See Comments)  . Tessalon [Benzonatate] Anaphylaxis  . Shellfish Allergy Itching and Other (See Comments)    Took benadryl to alleviate reaction  . Nsaids Other (See Comments)    Risk of bleeding  . Miripirium Rash and Other (See Comments)    Change in mental status    Family History:  Family History  Problem Relation Age of Onset  . Diabetes Father   . Hypertension Father   . Asthma      fhx  . Hypertension      fhx  . Stroke      fhx  . Heart disease Mother       Prior to Admission medications   Medication Sig Start Date End  Date Taking? Authorizing Provider  acetaminophen (TYLENOL) 325 MG tablet Take 2 tablets (650 mg total) by mouth every 6 (six) hours as needed for mild pain, moderate pain, fever or headache. 01/14/15  Yes Geradine Girt, DO  albuterol (PROVENTIL) (2.5 MG/3ML) 0.083% nebulizer solution Take 3 mLs (2.5 mg total) by nebulization every 4 (four) hours as needed for wheezing or shortness of breath. 04/27/14  Yes [name], MD  baclofen (LIORESAL) 10 MG tablet Take 1-2 tablets (10-20 mg total) by mouth 3 (three) times daily. Takes 10mg  in morning and lunchtime and takes 20mg  at bedtime Patient taking differently: 10-20 mg by PEG Tube route 3 (three) times daily. Take 1 tablet (10 mg) by mouth at 9am and 6pm, take 2 tablets (20 mg) daily at bedtime - 10pm 06/15/15  Yes Meredith Staggers, MD  collagenase (SANTYL) ointment Apply 1 application topically daily. Apply to back and foot wound every M/W/F Patient taking  differently: Apply 1 application topically every Monday, Wednesday, and Friday. Apply topically to foot wound 06/01/15  Yes Erick Colace, NP  doxycycline (VIBRA-TABS) 100 MG tablet Take 1 tablet (100 mg total) by mouth 2 (two) times daily. Patient taking differently: Take 100 mg by mouth 2 (two) times daily. 3 month course started approximately 07/01/15 07/05/15  Yes Truman Hayward, MD  Emollient (COLLAGEN EX) Apply 1 application topically daily. Apply daily to sacral/ischial wound, pack with dry kerlix, cover with abd gauze   Yes Historical Provider, MD  fentaNYL (DURAGESIC - DOSED MCG/HR) 50 MCG/HR Place 1 patch (50 mcg total) onto the skin every 3 (three) days. 06/29/15  Yes Meredith Staggers, MD  glucagon (GLUCAGON EMERGENCY) 1 MG injection INJECT INTO MUSCLE ONCE AS NEEDED Patient taking differently: Inject 1 mg into the muscle once as needed (low blood sugar).  07/05/15  Yes Renato Shin, MD  [place] [facility]) LIQD by PEG Tube route continuous. 70 ml/hr   Yes Historical Provider, MD  insulin aspart (NOVOLOG FLEXPEN) 100 UNIT/ML FlexPen Inject 0-10 Units into the skin 4 (four) times daily as needed for high blood sugar (CBG >200). Per sliding scale: CBG <200 0 units, 201-300 4 units, 301-400 6 units, >400 10 units   Yes Historical Provider, MD  insulin glargine (LANTUS) 100 UNIT/ML injection Inject 0.05 mLs (5 Units total) into the skin at bedtime. Patient taking differently: Inject 15 Units into the skin at bedtime.  06/01/15  Yes Erick Colace, NP  loratadine (CLARITIN) 10 MG tablet Place 1 tablet (10 mg total) into feeding tube at bedtime. Patient taking differently: 10 mg by PEG Tube route at bedtime.  06/01/15  Yes Erick Colace, NP  LORazepam (ATIVAN) 0.5 MG tablet Take 0.5 mg by mouth 3 (three) times daily as needed for anxiety.   Yes Historical Provider, MD  midodrine (PROAMATINE) 10 MG tablet Take 2 tablets (20 mg total) by mouth 3 (three) times daily with meals. Patient taking  differently: 30 mg by PEG Tube route 3 (three) times daily with meals. 9am, 12pm, 6pm 06/01/15  Yes Erick Colace, NP  mirtazapine (REMERON) 7.5 MG tablet Place 1 tablet (7.5 mg total) into feeding tube at bedtime. Patient taking differently: 7.5 mg by PEG Tube route at bedtime as needed (sleep).  06/01/15  Yes Erick Colace, NP  Multiple Vitamin (MULTIVITAMIN WITH MINERALS) TABS tablet Place 1 tablet into feeding tube daily. Patient taking differently: 1 tablet by PEG Tube route daily.  06/01/15  Yes Ottis Stain  Kary Kos, NP  ondansetron (ZOFRAN ODT) 8 MG disintegrating tablet Place 1 tablet (8 mg total) into feeding tube every 8 (eight) hours as needed for nausea or vomiting. Patient taking differently: 8 mg by PEG Tube route every 8 (eight) hours as needed for nausea or vomiting.  06/01/15  Yes Erick Colace, NP  Oxycodone HCl 20 MG TABS Take 1 tablet (20 mg total) by mouth every 6 (six) hours as needed. With dressing changes and for break through pain but NO MORE THAN 4 PER DAY Patient taking differently: Take 20 mg by mouth every 6 (six) hours as needed (breakthrough pain). With dressing changes and for break through pain but NO MORE THAN 4 PER DAY 06/29/15  Yes Meredith Staggers, MD  pantoprazole sodium (PROTONIX) 40 mg/20 mL PACK Place 20 mLs (40 mg total) into feeding tube daily. Patient taking differently: 40 mg by PEG Tube route daily.  06/01/15  Yes Erick Colace, NP  pregabalin (LYRICA) 100 MG capsule Place 1 capsule (100 mg total) into feeding tube 3 (three) times daily. Take 100 mg by mouth in the morning, take 100 mg by mouth in the afternoon, take 100 mg by mouth in the evening and take 200 mg by mouth at bedtime. Patient taking differently: 100-200 mg by PEG Tube route 4 (four) times daily. Take 1 capsule (100 mg) by mouth daily at 9am, 12pm, and 6pm, take 2 capsules (200 mg) daily at bedtime - 10pm 06/01/15  Yes Erick Colace, NP  ACCU-CHEK SMARTVIEW test strip Use to check blood sugar 4  times daily. 05/16/15   Renato Shin, MD  ciprofloxacin (CIPRO) 500 MG tablet Take 1 tablet (500 mg total) by mouth 2 (two) times daily. Patient not taking: Reported on 07/10/2015 07/08/15   [name], MD  insulin aspart (NOVOLOG) 100 UNIT/ML injection Inject 5 Units into the skin 3 (three) times daily with meals. Patient not taking: Reported on 07/10/2015 01/14/15   Geradine Girt, DO  Insulin Syringes, Disposable, U-100 1 ML MISC Dispense 29G x 1/2 inch, use as directed, diagnosis code E 10.41 07/08/15   [name], MD  LORazepam (ATIVAN) 1 MG tablet Take 1 tablet (1 mg total) by mouth every 6 (six) hours as needed for anxiety. Patient not taking: Reported on 07/10/2015 06/03/15   [name], MD  Nutritional Supplements (FEEDING SUPPLEMENT, VITAL AF 1.2 CAL,) LIQD Place 1,000 mLs into feeding tube continuous. Patient not taking: Reported on 07/10/2015 06/01/15   Erick Colace, NP    Physical Exam: Filed Vitals:   07/10/15 2245 07/10/15 2254 07/10/15 2300 07/10/15 2315  BP: 93/73  98/65 91/60  Pulse: 75  75 74  Temp:  96.6 F (35.9 C)    TempSrc:  Temporal    Resp: 13  15 15   SpO2: 100%  100% 100%     General:  Moderately built and poorly nourished.  Eyes: Anicteric pallor.  ENT: Trach seen.  Neck: No mass felt.  Cardiovascular: S1-S2 heard.  Respiratory: No rhonchi or crepitations.  Abdomen: Soft nontender PEG tube seen. Suprapubic catheter is seen.  Skin: Sacral and left chest wall also seen. No active discharge.  Musculoskeletal: No edema.  Psychiatric: Oriented to his name.  Neurologic: Alert awake oriented to his name and follows commands.  Labs on Admission:  Basic Metabolic Panel:  Recent Labs Lab 07/10/15 1633 07/10/15 1646  NA 128* 128*  K 5.1 5.1  CL 104 105  CO2 15*  --  GLUCOSE 289* 301*  BUN 71* 79*  CREATININE 1.43* 1.50*  CALCIUM 10.6*  --    Liver Function Tests:  Recent Labs Lab 07/10/15 1633  AST 13*  ALT 14*  ALKPHOS 139*   BILITOT 0.5  PROT 7.7  ALBUMIN 2.3*   No results for input(s): LIPASE, AMYLASE in the last 168 hours. No results for input(s): AMMONIA in the last 168 hours. CBC:  Recent Labs Lab 07/10/15 1633 07/10/15 1646  WBC 19.8*  --   NEUTROABS 14.0*  --   HGB 8.7* 10.5*  HCT 26.9* 31.0*  MCV 84.3  --   PLT 787*  --    Cardiac Enzymes: No results for input(s): CKTOTAL, CKMB, CKMBINDEX, TROPONINI in the last 168 hours.  BNP (last 3 results) No results for input(s): BNP in the last 8760 hours.  ProBNP (last 3 results) No results for input(s): PROBNP in the last 8760 hours.  CBG: No results for input(s): GLUCAP in the last 168 hours.  Radiological Exams on Admission: Dg Chest Port 1 View  07/10/2015   CLINICAL DATA:  Altered mental status. History of asthma, pneumonia, stroke, diabetes  EXAM: PORTABLE CHEST 1 VIEW  COMPARISON:  05/30/2015  FINDINGS: There is a tracheostomy tube with the tip 3.5 cm above the carina. There is no focal parenchymal opacity. There is no pleural effusion or pneumothorax. The heart and mediastinal contours are unremarkable.  There is an old healed right clavicular fracture.  IMPRESSION: No active disease.   Electronically Signed   By: Kathreen Devoid   On: 07/10/2015 17:29     Assessment/Plan Principal Problem:   Acute encephalopathy Active Problems:   Decubitus ulcer of ischial area   Chronic anemia   Uncontrolled type 1 diabetes mellitus with hyperglycemia ([place])   1. Acute encephalopathy likely from developing sepsis - I have placed patient on empiric antibiotics vancomycin and Primaxin. Possible source could be urinary tract infection. Patient also has recurrent pelvic osteomyelitis for which patient may need CT of the pelvis to further assess. Blood cultures urine cultures have been ordered. Check pro-calcitonin levels and lactic acid levels and continue hydration. Please consult infectious disease in a.m. CT of the head is pending. 2. Acute renal  failure - presently hydrating. Closely follow intake output. 3. Diabetes mellitus type 1 with hyperglycemia - on Lantus insulin with sliding scale coverage. 4. 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 - does not appear infected. Wound team consult. 5. Chronic anemia - follow CBC. 6. Chronic respiratory failure with trach and vent - pulmonary critical care following. 7. History of spinal canal injury with paraplegia.  I have reviewed patient's old charts of labs. Personally reviewed patient's chest x-ray.   DVT Prophylaxis Lovenox.  Code Status: Full code.  Family Communication: [name] and father.  Disposition Plan: Admit to inpatient.    Azzure Garabedian N. [facility] Pager [phone].  If 7PM-7AM, please contact night-coverage [email or web address] Password [facility] 07/10/2015, 11:39 PM

## 2015-07-10 NOTE — ED Provider Notes (Signed)
CSN: [id number]     Arrival date & time 07/10/15  1527 History   First MD Initiated Contact with Patient 07/10/15 1547     Chief Complaint  Patient presents with  . Altered Mental Status    Patient is a 29 y.o. male presenting with altered mental status. The history is provided by a parent and medical records.  Altered Mental Status Presenting symptoms: behavior changes and confusion (less interactive, grimacing )   Severity:  Moderate Episode history:  Continuous Duration:  2 days Timing:  Constant Progression:  Worsening Chronicity:  New Context: recent change in medication, recent illness (had PICC line removed last week after IV vanc was stopped, then found to have UTI and placed on cipro) and recent infection   Context: not head injury   Associated symptoms: no fever, no rash and no vomiting     Past Medical History  Diagnosis Date  . GERD (gastroesophageal reflux disease)   . Asthma   . Hx MRSA infection     on face  . Gastroparesis   . Diabetic neuropathy ([place])   . Seizures ([place])   . Family history of anesthesia complication     Pt mother can't have epidural procedures  . Dysrhythmia   . Pneumonia   . Arthritis   . Fibromyalgia   . 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 Type I diabetes mellitus ([place])     sees Dr. Loanne Drilling   . Syncope 02/16/2015   Past Surgical History  Procedure Laterality Date  . Tonsillectomy    . Multiple extractions with alveoloplasty N/A 08/03/2014    Procedure: MULTIPLE EXTRACTIONS;  Surgeon: Gae Bon, DDS;  Location: [facility];  Service: Oral Surgery;  Laterality: N/A;  . Tee without cardioversion N/A 08/17/2014    Procedure: TRANSESOPHAGEAL ECHOCARDIOGRAM (TEE);  Surgeon: Dorothy Spark, MD;  Location: [facility] ENDOSCOPY;  Service: Cardiovascular;  Laterality: N/A;  . Debridment of decubitus ulcer N/A 10/04/2014    Procedure: DEBRIDMENT OF DECUBITUS ULCER;  Surgeon: Georganna Skeans, MD;  Location: [place];   Service: General;  Laterality: N/A;  . Laparoscopic diverted colostomy N/A 10/12/2014    Procedure: LAPAROSCOPIC DIVERTING COLOSTOMY;  Surgeon: Donnie Mesa, MD;  Location: [place];  Service: General;  Laterality: N/A;  . Insertion of suprapubic catheter N/A 10/12/2014    Procedure: INSERTION OF SUPRAPUBIC CATHETER;  Surgeon: Reece Packer, MD;  Location: [place];  Service: Urology;  Laterality: N/A;  . Incision and drainage of wound N/A 11/12/2014    Procedure: IRRIGATION AND DEBRIDEMENT OF WOUNDS WITH BONE BIOPSY AND SURGICAL PREP ;  Surgeon: Theodoro Kos, DO;  Location: [place];  Service: Plastics;  Laterality: N/A;  . Application of a-cell of back N/A 11/12/2014    Procedure: APPLICATION A CELL AND VAC ;  Surgeon: Theodoro Kos, DO;  Location: [place];  Service: Plastics;  Laterality: N/A;  . Incision and drainage of wound N/A 11/18/2014    Procedure: IRRIGATION AND DEBRIDEMENT OF SACRAL ULCER ONLY WITH PLACEMENT OF A CELL AND VAC/ DRESSING CHANGE TO UPPER BACK AREA.;  Surgeon: Theodoro Kos, DO;  Location: [place];  Service: Plastics;  Laterality: N/A;  . Incision and drainage of wound N/A 11/25/2014    Procedure: IRRIGATION AND DEBRIDEMENT OF SACRAL ULCER AND BACK BURN WITH PLACEMENT OF A-CELL;  Surgeon: Theodoro Kos, DO;  Location: [place];  Service: Plastics;  Laterality: N/A;  . Minor application of wound vac N/A 11/25/2014  Procedure:  WOUND VAC CHANGE;  Surgeon: Theodoro Kos, DO;  Location: [place];  Service: Plastics;  Laterality: N/A;  . Incision and drainage of wound Right 12/08/2014    Procedure: IRRIGATION AND DEBRIDEMENT SACRAL WOUND AND RIGHT ISCHIAL WOUND ;  Surgeon: Theodoro Kos, DO;  Location: [place];  Service: Plastics;  Laterality: Right;  . Application of a-cell of back N/A 12/08/2014    Procedure: APPLICATION OF A-CELL AND WOUND VAC ;  Surgeon: Theodoro Kos, DO;  Location: [place];  Service: Plastics;  Laterality: N/A;  . Tee without cardioversion N/A 05/05/2015    Procedure: TRANSESOPHAGEAL  ECHOCARDIOGRAM (TEE);  Surgeon: Lelon Perla, MD;  Location: [place];  Service: Cardiovascular;  Laterality: N/A;  . Incision and drainage of wound Bilateral 05/05/2015    Procedure: IRRIGATION AND DEBRIDEMENT SACRAL ULCER;  Surgeon: Theodoro Kos, DO;  Location: [place];  Service: Plastics;  Laterality: Bilateral;  . Hematoma evacuation N/A 05/05/2015    Procedure: EVACUATION HEMATOMA bedside procedure;  Surgeon: Theodoro Kos, DO;  Location: [place];  Service: Plastics;  Laterality: N/A;   Family History  Problem Relation Age of Onset  . Diabetes Father   . Hypertension Father   . Asthma      fhx  . Hypertension      fhx  . Stroke      fhx  . Heart disease Mother    Social History  Substance Use Topics  . Smoking status: Former Smoker -- 1.00 packs/day for 12 years    Types: Cigars, Cigarettes    Quit date: 09/07/2014  . Smokeless tobacco: Never Used  . Alcohol Use: No    Review of Systems  Constitutional: Negative for fever.  HENT: Negative for rhinorrhea.   Eyes: Negative for redness.  Respiratory:       No increased secretions or work of breathing evident  Cardiovascular: Negative for leg swelling.  Gastrointestinal: Negative for vomiting.  Genitourinary: Positive for decreased urine volume (some decreased urine output in suprapubic recently, with cloudy white urine ).  Skin: Positive for wound (left foot ulcers, chronic decubitus ulcers on back). Negative for rash.  Allergic/Immunologic: Negative for immunocompromised state.  Neurological: Negative for syncope.  Psychiatric/Behavioral: Positive for confusion (less interactive, grimacing ).    Allergies  Cefuroxime axetil; Ertapenem; Morphine and related; Penicillins; Sulfa antibiotics; Tessalon; Shellfish allergy; and Miripirium  Home Medications   Prior to Admission medications   Medication Sig Start Date End Date Taking? Authorizing Provider  ACCU-CHEK SMARTVIEW test strip Use to check blood sugar 4 times  daily. 05/16/15   Renato Shin, MD  acetaminophen (TYLENOL) 325 MG tablet Take 2 tablets (650 mg total) by mouth every 6 (six) hours as needed for mild pain, moderate pain, fever or headache. 01/14/15   Geradine Girt, DO  albuterol (PROVENTIL) (2.5 MG/3ML) 0.083% nebulizer solution Take 3 mLs (2.5 mg total) by nebulization every 4 (four) hours as needed for wheezing or shortness of breath. 04/27/14   [name], MD  baclofen (LIORESAL) 10 MG tablet Take 1-2 tablets (10-20 mg total) by mouth 3 (three) times daily. Takes 10mg  in morning and lunchtime and takes 20mg  at bedtime 06/15/15   Meredith Staggers, MD  ciprofloxacin (CIPRO) 500 MG tablet Take 1 tablet (500 mg total) by mouth 2 (two) times daily. 07/08/15   [name], MD  collagenase (SANTYL) ointment Apply 1 application topically daily. Apply to back and foot wound every M/W/F 06/01/15   Erick Colace, NP  doxycycline (VIBRA-TABS) 100 MG tablet Take 1 tablet (100 mg total) by mouth 2 (two) times daily. 07/05/15   Truman Hayward, MD  fentaNYL (DURAGESIC - DOSED MCG/HR) 50 MCG/HR Place 1 patch (50 mcg total) onto the skin every 3 (three) days. 06/29/15   Meredith Staggers, MD  glucagon (GLUCAGON EMERGENCY) 1 MG injection INJECT INTO MUSCLE ONCE AS NEEDED 07/05/15   Renato Shin, MD  insulin aspart (NOVOLOG) 100 UNIT/ML injection Inject 5 Units into the skin 3 (three) times daily with meals. 01/14/15   Geradine Girt, DO  insulin glargine (LANTUS) 100 UNIT/ML injection Inject 0.05 mLs (5 Units total) into the skin at bedtime. 06/01/15   Erick Colace, NP  Insulin Syringes, Disposable, U-100 1 ML MISC Dispense 29G x 1/2 inch, use as directed, diagnosis code E 10.41 07/08/15   [name], MD  loratadine (CLARITIN) 10 MG tablet Place 1 tablet (10 mg total) into feeding tube at bedtime. 06/01/15   Erick Colace, NP  LORazepam (ATIVAN) 1 MG tablet Take 1 tablet (1 mg total) by mouth every 6 (six) hours as needed for anxiety. Patient taking differently:  Take 0.5 mg by mouth every 8 (eight) hours as needed for anxiety.  06/03/15   [name], MD  midodrine (PROAMATINE) 10 MG tablet Take 2 tablets (20 mg total) by mouth 3 (three) times daily with meals. Patient taking differently: Take 30 mg by mouth 3 (three) times daily with meals.  06/01/15   Erick Colace, NP  mirtazapine (REMERON) 7.5 MG tablet Place 1 tablet (7.5 mg total) into feeding tube at bedtime. 06/01/15   Erick Colace, NP  Multiple Vitamin (MULTIVITAMIN WITH MINERALS) TABS tablet Place 1 tablet into feeding tube daily. 06/01/15   Erick Colace, NP  Nutritional Supplements (FEEDING SUPPLEMENT, VITAL AF 1.2 CAL,) LIQD Place 1,000 mLs into feeding tube continuous. 06/01/15   Erick Colace, NP  ondansetron (ZOFRAN ODT) 8 MG disintegrating tablet Place 1 tablet (8 mg total) into feeding tube every 8 (eight) hours as needed for nausea or vomiting. 06/01/15   Erick Colace, NP  Oxycodone HCl 20 MG TABS Take 1 tablet (20 mg total) by mouth every 6 (six) hours as needed. With dressing changes and for break through pain but NO MORE THAN 4 PER DAY 06/29/15   Meredith Staggers, MD  pantoprazole sodium (PROTONIX) 40 mg/20 mL PACK Place 20 mLs (40 mg total) into feeding tube daily. 06/01/15   Erick Colace, NP  pregabalin (LYRICA) 100 MG capsule Place 1 capsule (100 mg total) into feeding tube 3 (three) times daily. Take 100 mg by mouth in the morning, take 100 mg by mouth in the afternoon, take 100 mg by mouth in the evening and take 200 mg by mouth at bedtime. 06/01/15   Erick Colace, NP   BP 74/46 mmHg  Pulse 84  Resp 10  SpO2 100% Physical Exam  Constitutional: No distress.  Thin, chronically ill appearing, trach / ostomy / suprapubic urinary cath  HENT:  Head: Normocephalic and atraumatic.  Eyes: Right eye exhibits no discharge. Left eye exhibits no discharge.  Neck: Normal range of motion. Neck supple. No tracheal deviation present.  Cardiovascular: Normal rate and regular rhythm.    Pulmonary/Chest: Effort normal.  bilat rhonchi in all lung fields  Abdominal: Soft. He exhibits no distension. There is no tenderness.  Ostomy pink, perfused, light brown stool. Suprapubic urinary cath site c/d/i, no surrounding  induration or fluctuance and no drainage around insertion  Musculoskeletal: He exhibits no edema.  Neurological:  Somnolent, not answering most questions.   Skin: Skin is warm and dry.  Left foot numerous ulcerated lesions on heel and toes/forefoot. Back with large decubitus ulcers, thoracic area wounds with dressing pad   Psychiatric: His behavior is normal.    ED Course  Procedures (including critical care time) Labs Review Labs Reviewed  CBC WITH DIFFERENTIAL/PLATELET - Abnormal; Notable for the following:    WBC 19.8 (*)    RBC 3.19 (*)    Hemoglobin 8.7 (*)    HCT 26.9 (*)    RDW 15.6 (*)    Platelets 787 (*)    Neutro Abs 14.0 (*)    Monocytes Absolute 4.0 (*)    All other components within normal limits  COMPREHENSIVE METABOLIC PANEL - Abnormal; Notable for the following:    Sodium 128 (*)    CO2 15 (*)    Glucose, Bld 289 (*)    BUN 71 (*)    Creatinine, Ser 1.43 (*)    Calcium 10.6 (*)    Albumin 2.3 (*)    AST 13 (*)    ALT 14 (*)    Alkaline Phosphatase 139 (*)    All other components within normal limits  URINALYSIS, ROUTINE W REFLEX MICROSCOPIC (NOT AT [facility]) - Abnormal; Notable for the following:    APPearance TURBID (*)    Glucose, UA 100 (*)    Hgb urine dipstick LARGE (*)    Protein, ur >300 (*)    Leukocytes, UA LARGE (*)    All other components within normal limits  URINE MICROSCOPIC-ADD ON - Abnormal; Notable for the following:    Squamous Epithelial / LPF FEW (*)    Bacteria, UA MANY (*)    Crystals CA OXALATE CRYSTALS (*)    All other components within normal limits  I-STAT CHEM 8, ED - Abnormal; Notable for the following:    Sodium 128 (*)    BUN 79 (*)    Creatinine, Ser 1.50 (*)    Glucose, Bld 301 (*)     Calcium, Ion 1.45 (*)    Hemoglobin 10.5 (*)    HCT 31.0 (*)    All other components within normal limits  CULTURE, BLOOD (ROUTINE X 2)  CULTURE, BLOOD (ROUTINE X 2)  I-STAT CG4 LACTIC ACID, ED  I-STAT CG4 LACTIC ACID, ED  CBG MONITORING, ED    Imaging Review Dg Chest Port 1 View  07/10/2015   CLINICAL DATA:  Altered mental status. History of asthma, pneumonia, stroke, diabetes  EXAM: PORTABLE CHEST 1 VIEW  COMPARISON:  05/30/2015  FINDINGS: There is a tracheostomy tube with the tip 3.5 cm above the carina. There is no focal parenchymal opacity. There is no pleural effusion or pneumothorax. The heart and mediastinal contours are unremarkable.  There is an old healed right clavicular fracture.  IMPRESSION: No active disease.   Electronically Signed   By: Kathreen Devoid   On: 07/10/2015 17:29   I have personally reviewed and evaluated these images and lab results as part of my medical decision-making.   EKG Interpretation None      MDM   Final diagnoses:  Tracheostomy status ([place])  Altered mental status, unspecified altered mental status type  Hyponatremia   29 year old male with history of spinal stroke and quadriplegia, chronic respiratory failure with hypoxia and trach (vent at night, collar during day), bilateral PEs/dvt, diabetes, aspiration pneumonia, decubitus ulcers,  anemia, UTIs, abdominal abscess, s/p ostomy and feeding tube presenting with altered mental status as above. Upon arrival, core temp as measured by ostomy site (no rectum) is low. Soft blood pressure slightly lower than patient's baseline with leukocytosis to 19.8. Concerning for recurring infectious etiology. Cultures drawn, started on empiric abx. Found to have new hyponatremia to 128, possibly contributing to her not completely explaining altered mental status. Also found to have AKI. Blood pressures and mental status improving after fluid resuscitation. Discussed with hospitalist and critical care teams. Triaged to  stepdown unit. Admitted for further management.   Case discussed with Dr. Alvino Chapel, who oversaw management of this patient.     Ivin Booty, MD 07/11/15 9201  Davonna Belling, MD 07/11/15 1452

## 2015-07-10 NOTE — ED Notes (Signed)
Dr. Kakrakandy at bedside. 

## 2015-07-10 NOTE — Progress Notes (Addendum)
Pt will be transferring to 2S by the RN. The patient has a chronic trach and uses Vent support QHS. The pt's father is in the room and stated they did not bring the home vent. They will need to use one of ours. The father stated he did not know the exact setting for the vent. I will report this over to [place], RT for 2S.

## 2015-07-10 NOTE — Progress Notes (Signed)
ANTIBIOTIC CONSULT NOTE - INITIAL  Pharmacy Consult for Vancomycin/Primaxin  Indication: rule out sepsis  Allergies  Allergen Reactions  . Cefuroxime Axetil Anaphylaxis  . Ertapenem Other (See Comments)    Rash and confusion-->tolerated Imipenem   . Morphine And Related Other (See Comments)    Changed mental status, confusion, headache, visual hallucination  . Penicillins Anaphylaxis and Other (See Comments)    Tolerated Imipenem; no reaction to 7 day course of amoxicillin in 2015 Has patient had a PCN reaction causing immediate rash, facial/tongue/throat swelling, SOB or lightheadedness with hypotension: Yes Has patient had a PCN reaction causing severe rash involving mucus membranes or skin necrosis: No Has patient had a PCN reaction that required hospitalization Yes Has patient had a PCN reaction occurring within the last 10 years: No If all of the above answers are "NO", then may proceed with Cephalo  . Sulfa Antibiotics Anaphylaxis, Shortness Of Breath and Other (See Comments)  . Tessalon [Benzonatate] Anaphylaxis  . Shellfish Allergy Itching and Other (See Comments)    Took benadryl to alleviate reaction  . Nsaids Other (See Comments)    Risk of bleeding  . Miripirium Rash and Other (See Comments)    Change in mental status    Patient Measurements: ~45 kg  Vital Signs: Temp: 95.8 F (35.4 C) (10/02 1837) Temp Source: Temporal (10/02 1837) BP: 100/63 mmHg (10/02 2200) Pulse Rate: 81 (10/02 2200)  Labs:  Recent Labs  07/10/15 1633 07/10/15 1646  WBC 19.8*  --   HGB 8.7* 10.5*  PLT 787*  --   CREATININE 1.43* 1.50*   Estimated Creatinine Clearance: 45.3 mL/min (by C-G formula based on Cr of 1.5).  Medical History: Past Medical History  Diagnosis Date  . GERD (gastroesophageal reflux disease)   . Asthma   . Hx MRSA infection     on face  . Gastroparesis   . Diabetic neuropathy ([place])   . Seizures ([place])   . Family history of anesthesia complication    Pt mother can't have epidural procedures  . Dysrhythmia   . Pneumonia   . Arthritis   . Fibromyalgia   . 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 Type I diabetes mellitus ([place])     sees Dr. Loanne Drilling   . Syncope 02/16/2015   Assessment: 29 y/o quadriplegic male with complex medical history, recent visit with acinetobacter HCAP/MRSA bacteremia/chronic osteo, recently completed a 6 week course of vancomycin, pt is in acute renal failure with Scr 1.5, here with altered mental status, WBC is elevated, pt is hypothermic, other labs/meds reviewed. Pt was also recently started doxycycline by ID as outpatient in case he had residual osteo.   Goal of Therapy:  Vancomycin trough level 15-20 mcg/ml  Plan:  -Vancomycin 500 mg IV q24h -Primaxin 250 mg IV q8h -Trend WBC, temp, Scr -Drug levels at steady state, may check random earlier if Scr worsens  Narda Bonds 07/10/2015,10:29 PM

## 2015-07-10 NOTE — ED Notes (Signed)
Pt here from home with c/o aloc , pt has recently finished up vanc for a resp infection and just started on cipro for a  UTI, pt is  Quadriplegic and trach

## 2015-07-11 ENCOUNTER — Inpatient Hospital Stay (HOSPITAL_COMMUNITY): Payer: Medicaid Other

## 2015-07-11 LAB — COMPREHENSIVE METABOLIC PANEL
ALT: 11 U/L — ABNORMAL LOW (ref 17–63)
ANION GAP: 10 (ref 5–15)
AST: 20 U/L (ref 15–41)
Albumin: 2 g/dL — ABNORMAL LOW (ref 3.5–5.0)
Alkaline Phosphatase: 139 U/L — ABNORMAL HIGH (ref 38–126)
BUN: 68 mg/dL — ABNORMAL HIGH (ref 6–20)
CHLORIDE: 103 mmol/L (ref 101–111)
CO2: 14 mmol/L — ABNORMAL LOW (ref 22–32)
Calcium: 9.7 mg/dL (ref 8.9–10.3)
Creatinine, Ser: 1.29 mg/dL — ABNORMAL HIGH (ref 0.61–1.24)
Glucose, Bld: 308 mg/dL — ABNORMAL HIGH (ref 65–99)
POTASSIUM: 4.8 mmol/L (ref 3.5–5.1)
Sodium: 127 mmol/L — ABNORMAL LOW (ref 135–145)
Total Bilirubin: 0.3 mg/dL (ref 0.3–1.2)
Total Protein: 7.1 g/dL (ref 6.5–8.1)

## 2015-07-11 LAB — GLUCOSE, CAPILLARY
GLUCOSE-CAPILLARY: 223 mg/dL — AB (ref 65–99)
GLUCOSE-CAPILLARY: 53 mg/dL — AB (ref 65–99)
GLUCOSE-CAPILLARY: 72 mg/dL (ref 65–99)
Glucose-Capillary: 376 mg/dL — ABNORMAL HIGH (ref 65–99)
Glucose-Capillary: 309 mg/dL — ABNORMAL HIGH (ref 65–99)
Glucose-Capillary: 163 mg/dL — ABNORMAL HIGH (ref 65–99)
Glucose-Capillary: 101 mg/dL — ABNORMAL HIGH (ref 65–99)
Glucose-Capillary: 48 mg/dL — ABNORMAL LOW (ref 65–99)

## 2015-07-11 LAB — APTT: APTT: 45 s — AB (ref 24–37)

## 2015-07-11 LAB — CBC WITH DIFFERENTIAL/PLATELET
BASOS ABS: 0 10*3/uL (ref 0.0–0.1)
Basophils Relative: 0 %
EOS PCT: 0 %
Eosinophils Absolute: 0 10*3/uL (ref 0.0–0.7)
HCT: 25.2 % — ABNORMAL LOW (ref 39.0–52.0)
Hemoglobin: 7.9 g/dL — ABNORMAL LOW (ref 13.0–17.0)
LYMPHS PCT: 8 %
Lymphs Abs: 1.2 10*3/uL (ref 0.7–4.0)
MCH: 26.2 pg (ref 26.0–34.0)
MCHC: 31.3 g/dL (ref 30.0–36.0)
MCV: 83.4 fL (ref 78.0–100.0)
MONO ABS: 0.7 10*3/uL (ref 0.1–1.0)
Monocytes Relative: 5 %
Neutro Abs: 13.1 10*3/uL — ABNORMAL HIGH (ref 1.7–7.7)
Neutrophils Relative %: 87 %
PLATELETS: 669 10*3/uL — AB (ref 150–400)
RBC: 3.02 MIL/uL — ABNORMAL LOW (ref 4.22–5.81)
RDW: 15.2 % (ref 11.5–15.5)
WBC: 15 10*3/uL — ABNORMAL HIGH (ref 4.0–10.5)

## 2015-07-11 LAB — PROTIME-INR
INR: 1.18 (ref 0.00–1.49)
PROTHROMBIN TIME: 15.2 s (ref 11.6–15.2)

## 2015-07-11 LAB — LACTIC ACID, PLASMA: LACTIC ACID, VENOUS: 2.4 mmol/L — AB (ref 0.5–2.0)

## 2015-07-11 LAB — MRSA PCR SCREENING: MRSA by PCR: NEGATIVE

## 2015-07-11 LAB — PROCALCITONIN: PROCALCITONIN: 18.55 ng/mL

## 2015-07-11 LAB — TSH: TSH: 2.491 u[IU]/mL (ref 0.350–4.500)

## 2015-07-11 MED ORDER — BACLOFEN 10 MG PO TABS
20.0000 mg | ORAL_TABLET | Freq: Every day | ORAL | Status: DC
  Administered 2015-07-11 – 2015-07-19 (×10): 20 mg
  Filled 2015-07-11: qty 2
  Filled 2015-07-11: qty 1
  Filled 2015-07-11 (×2): qty 2
  Filled 2015-07-11: qty 1
  Filled 2015-07-11: qty 2
  Filled 2015-07-11: qty 1
  Filled 2015-07-11 (×2): qty 2
  Filled 2015-07-11: qty 1
  Filled 2015-07-11: qty 2

## 2015-07-11 MED ORDER — SODIUM CHLORIDE 0.9 % IV BOLUS (SEPSIS)
500.0000 mL | Freq: Once | INTRAVENOUS | Status: AC
  Administered 2015-07-11: 500 mL via INTRAVENOUS

## 2015-07-11 MED ORDER — JEVITY 1.2 CAL PO LIQD: 1000.0000 mL | ORAL | Status: DC

## 2015-07-11 MED ORDER — ACETAMINOPHEN 325 MG PO TABS: 650.0000 mg | ORAL_TABLET | Freq: Four times a day (QID) | ORAL | Status: DC | PRN

## 2015-07-11 MED ORDER — ALBUTEROL SULFATE (2.5 MG/3ML) 0.083% IN NEBU
2.5000 mg | INHALATION_SOLUTION | RESPIRATORY_TRACT | Status: DC | PRN
  Administered 2015-07-11: 2.5 mg via RESPIRATORY_TRACT
  Filled 2015-07-11: qty 3

## 2015-07-11 MED ORDER — PREGABALIN 100 MG PO CAPS
200.0000 mg | ORAL_CAPSULE | Freq: Every day | ORAL | Status: DC
  Administered 2015-07-11 – 2015-07-19 (×10): 200 mg
  Filled 2015-07-11 (×10): qty 2

## 2015-07-11 MED ORDER — LORAZEPAM 0.5 MG PO TABS
0.5000 mg | ORAL_TABLET | Freq: Three times a day (TID) | ORAL | Status: DC | PRN
  Administered 2015-07-13 – 2015-08-02 (×15): 0.5 mg via ORAL
  Filled 2015-07-11 (×16): qty 1

## 2015-07-11 MED ORDER — ONDANSETRON 8 MG PO TBDP: 8.0000 mg | ORAL_TABLET | Freq: Three times a day (TID) | ORAL | Status: DC | PRN

## 2015-07-11 MED ORDER — COLLAGENASE 250 UNIT/GM EX OINT
TOPICAL_OINTMENT | Freq: Every day | CUTANEOUS | Status: DC
  Administered 2015-07-11: 1 via TOPICAL
  Administered 2015-07-12 – 2015-08-13 (×32): via TOPICAL
  Administered 2015-08-14: 1 via TOPICAL
  Administered 2015-08-15 – 2015-08-16 (×2): via TOPICAL
  Filled 2015-07-11 (×8): qty 30

## 2015-07-11 MED ORDER — ANTISEPTIC ORAL RINSE SOLUTION (CORINZ)
7.0000 mL | Freq: Four times a day (QID) | OROMUCOSAL | Status: DC
  Administered 2015-07-11 – 2015-07-24 (×43): 7 mL via OROMUCOSAL

## 2015-07-11 MED ORDER — BACLOFEN 10 MG PO TABS
10.0000 mg | ORAL_TABLET | ORAL | Status: DC
  Administered 2015-07-11 – 2015-07-20 (×19): 10 mg
  Filled 2015-07-11 (×21): qty 1

## 2015-07-11 MED ORDER — PREGABALIN 100 MG PO CAPS
100.0000 mg | ORAL_CAPSULE | ORAL | Status: DC
  Administered 2015-07-11 – 2015-07-20 (×27): 100 mg
  Filled 2015-07-11 (×26): qty 1

## 2015-07-11 MED ORDER — SODIUM CHLORIDE 0.9 % IV SOLN
250.0000 mg | Freq: Two times a day (BID) | INTRAVENOUS | Status: DC
  Administered 2015-07-11 – 2015-07-13 (×4): 250 mg via INTRAVENOUS
  Filled 2015-07-11 (×5): qty 250

## 2015-07-11 MED ORDER — OXYCODONE HCL 5 MG PO TABS
20.0000 mg | ORAL_TABLET | Freq: Four times a day (QID) | ORAL | Status: DC | PRN
  Administered 2015-07-11: 20 mg via ORAL
  Filled 2015-07-11: qty 4

## 2015-07-11 MED ORDER — MIDODRINE HCL 5 MG PO TABS
30.0000 mg | ORAL_TABLET | Freq: Three times a day (TID) | ORAL | Status: DC
  Administered 2015-07-11 – 2015-07-20 (×28): 30 mg
  Filled 2015-07-11 (×32): qty 6

## 2015-07-11 MED ORDER — DEXTROSE 50 % IV SOLN
25.0000 mL | Freq: Once | INTRAVENOUS | Status: AC
  Administered 2015-07-11: 25 mL via INTRAVENOUS

## 2015-07-11 MED ORDER — ACETAMINOPHEN 325 MG PO TABS
650.0000 mg | ORAL_TABLET | Freq: Four times a day (QID) | ORAL | Status: DC | PRN
  Administered 2015-07-14: 650 mg
  Filled 2015-07-11: qty 2

## 2015-07-11 MED ORDER — ALBUTEROL SULFATE (2.5 MG/3ML) 0.083% IN NEBU: 2.5000 mg | INHALATION_SOLUTION | RESPIRATORY_TRACT | Status: DC | PRN

## 2015-07-11 MED ORDER — ACETAMINOPHEN 650 MG RE SUPP: 650.0000 mg | Freq: Four times a day (QID) | RECTAL | Status: DC | PRN

## 2015-07-11 MED ORDER — VITAL AF 1.2 CAL PO LIQD
1000.0000 mL | ORAL | Status: DC
  Administered 2015-07-11 – 2015-07-17 (×4): 1000 mL
  Filled 2015-07-11 (×12): qty 1000

## 2015-07-11 MED ORDER — ENOXAPARIN SODIUM 30 MG/0.3ML ~~LOC~~ SOLN
30.0000 mg | Freq: Every day | SUBCUTANEOUS | Status: DC
  Administered 2015-07-12 – 2015-07-27 (×16): 30 mg via SUBCUTANEOUS
  Filled 2015-07-11 (×16): qty 0.3

## 2015-07-11 MED ORDER — OXYCODONE HCL 5 MG PO TABS
20.0000 mg | ORAL_TABLET | Freq: Four times a day (QID) | ORAL | Status: DC | PRN
  Administered 2015-07-11 – 2015-07-13 (×5): 20 mg
  Filled 2015-07-11 (×5): qty 4

## 2015-07-11 MED ORDER — FENTANYL 50 MCG/HR TD PT72
50.0000 ug | MEDICATED_PATCH | TRANSDERMAL | Status: DC
  Administered 2015-07-14 – 2015-08-16 (×12): 50 ug via TRANSDERMAL
  Filled 2015-07-11 (×4): qty 1
  Filled 2015-07-11 (×3): qty 2
  Filled 2015-07-11 (×4): qty 1

## 2015-07-11 MED ORDER — ADULT MULTIVITAMIN W/MINERALS CH
1.0000 | ORAL_TABLET | Freq: Every day | ORAL | Status: DC
  Administered 2015-07-11 – 2015-07-20 (×10): 1
  Filled 2015-07-11 (×11): qty 1

## 2015-07-11 MED ORDER — INSULIN ASPART 100 UNIT/ML ~~LOC~~ SOLN
0.0000 [IU] | SUBCUTANEOUS | Status: DC
  Administered 2015-07-11: 7 [IU] via SUBCUTANEOUS
  Administered 2015-07-11: 2 [IU] via SUBCUTANEOUS
  Administered 2015-07-11: 3 [IU] via SUBCUTANEOUS
  Administered 2015-07-12 (×3): 1 [IU] via SUBCUTANEOUS
  Administered 2015-07-13 (×2): 5 [IU] via SUBCUTANEOUS
  Administered 2015-07-13: 1 [IU] via SUBCUTANEOUS
  Administered 2015-07-13: 2 [IU] via SUBCUTANEOUS
  Administered 2015-07-13: 5 [IU] via SUBCUTANEOUS
  Administered 2015-07-13 – 2015-07-14 (×2): 3 [IU] via SUBCUTANEOUS
  Administered 2015-07-14: 5 [IU] via SUBCUTANEOUS
  Administered 2015-07-14: 3 [IU] via SUBCUTANEOUS
  Administered 2015-07-14: 2 [IU] via SUBCUTANEOUS

## 2015-07-11 MED ORDER — GLUCERNA 1.2 CAL PO LIQD
70.0000 mL/h | ORAL | Status: DC
  Administered 2015-07-11: 70 mL/h
  Filled 2015-07-11 (×3): qty 1000

## 2015-07-11 MED ORDER — SODIUM CHLORIDE 0.9 % IV SOLN
INTRAVENOUS | Status: DC
  Administered 2015-07-11: 14:00:00 via INTRAVENOUS
  Administered 2015-07-11: 75 mL/h via INTRAVENOUS

## 2015-07-11 MED ORDER — INSULIN GLARGINE 100 UNIT/ML ~~LOC~~ SOLN
10.0000 [IU] | Freq: Every day | SUBCUTANEOUS | Status: DC
  Administered 2015-07-13: 10 [IU] via SUBCUTANEOUS
  Filled 2015-07-11 (×5): qty 0.1

## 2015-07-11 MED ORDER — INSULIN GLARGINE 100 UNIT/ML ~~LOC~~ SOLN
15.0000 [IU] | Freq: Every day | SUBCUTANEOUS | Status: DC
  Administered 2015-07-11: 15 [IU] via SUBCUTANEOUS
  Filled 2015-07-11 (×2): qty 0.15

## 2015-07-11 MED ORDER — MIRTAZAPINE 15 MG PO TABS
7.5000 mg | ORAL_TABLET | Freq: Every evening | ORAL | Status: DC | PRN
  Administered 2015-07-20: 7.5 mg
  Filled 2015-07-11 (×2): qty 1

## 2015-07-11 MED ORDER — SODIUM CHLORIDE 0.9 % IV SOLN
INTRAVENOUS | Status: DC
Start: 2015-07-11 — End: 2015-08-16
  Administered 2015-07-12 – 2015-08-10 (×11): via INTRAVENOUS

## 2015-07-11 MED ORDER — FENTANYL 25 MCG/HR TD PT72
50.0000 ug | MEDICATED_PATCH | TRANSDERMAL | Status: DC
  Administered 2015-07-11: 50 ug via TRANSDERMAL
  Filled 2015-07-11: qty 2

## 2015-07-11 MED ORDER — ONDANSETRON HCL 4 MG/2ML IJ SOLN
4.0000 mg | Freq: Four times a day (QID) | INTRAMUSCULAR | Status: DC | PRN
  Administered 2015-07-31 – 2015-08-09 (×4): 4 mg via INTRAVENOUS
  Filled 2015-07-11 (×4): qty 2

## 2015-07-11 MED ORDER — PANTOPRAZOLE SODIUM 40 MG PO PACK
40.0000 mg | PACK | Freq: Every day | ORAL | Status: DC
  Administered 2015-07-11 – 2015-07-20 (×10): 40 mg
  Filled 2015-07-11 (×12): qty 20

## 2015-07-11 MED ORDER — DEXTROSE 50 % IV SOLN
INTRAVENOUS | Status: AC
  Administered 2015-07-11: 25 mL via INTRAVENOUS
  Filled 2015-07-11: qty 50

## 2015-07-11 MED ORDER — ENOXAPARIN SODIUM 30 MG/0.3ML ~~LOC~~ SOLN
30.0000 mg | SUBCUTANEOUS | Status: DC
  Administered 2015-07-11: 30 mg via SUBCUTANEOUS
  Filled 2015-07-11: qty 0.3

## 2015-07-11 MED ORDER — ONDANSETRON HCL 4 MG PO TABS: 4.0000 mg | ORAL_TABLET | Freq: Four times a day (QID) | ORAL | Status: DC | PRN

## 2015-07-11 MED ORDER — COLLAGENASE 250 UNIT/GM EX OINT
1.0000 "application " | TOPICAL_OINTMENT | CUTANEOUS | Status: DC
  Administered 2015-07-11: 1 via TOPICAL
  Filled 2015-07-11: qty 30

## 2015-07-11 MED ORDER — LORATADINE 10 MG PO TABS
10.0000 mg | ORAL_TABLET | Freq: Every day | ORAL | Status: DC
  Administered 2015-07-11 – 2015-07-12 (×3): 10 mg
  Filled 2015-07-11 (×4): qty 1

## 2015-07-11 MED ORDER — HALOPERIDOL LACTATE 5 MG/ML IJ SOLN
1.0000 mg | INTRAMUSCULAR | Status: DC | PRN
  Administered 2015-07-11 – 2015-07-17 (×6): 2 mg via INTRAVENOUS
  Filled 2015-07-11 (×7): qty 1

## 2015-07-11 MED ORDER — INSULIN ASPART 100 UNIT/ML ~~LOC~~ SOLN: 0.0000 [IU] | Freq: Three times a day (TID) | SUBCUTANEOUS | Status: DC

## 2015-07-11 MED ORDER — CHLORHEXIDINE GLUCONATE 0.12% ORAL RINSE (MEDLINE KIT)
15.0000 mL | Freq: Two times a day (BID) | OROMUCOSAL | Status: DC
  Administered 2015-07-11 – 2015-07-30 (×30): 15 mL via OROMUCOSAL

## 2015-07-11 NOTE — Progress Notes (Signed)
Patient transported to CT and back to room 2S06 without any complications.

## 2015-07-11 NOTE — ED Notes (Signed)
Attempted report 

## 2015-07-11 NOTE — Telephone Encounter (Signed)
[facility] Night - Client TELEPHONE [facility] Patient Name: Jason [name] Gender: Male DOB: 1986/04/14 Age: 29 Y 1 D Return Phone Number: [phone] (Primary), [phone] (Secondary) Address: City/State/Zip: [place] [address] Client  [facility] Night - Client Client Site [facility] - Night Physician [place], [place] Type Call Call Type Triage / Clinical Caller Name Rochele Pages Relationship To Patient Other Return Phone Number [phone] (Secondary) Chief Complaint CONFUSION - new onset Initial Comment Caller states she is working with a patient who has a possible UTI and was prescribed an rx. he is having some confusion and disorientation since taking the medication. She is the morning nurse for patient, Rochele Pages PreDisposition Call another nurse Nurse Assessment Nurse: Donovan Kail, RN, Barnetta Chapel Date/Time Eilene Ghazi Time): 07/10/2015 1:48:23 PM Confirm and document reason for call. If symptomatic, describe symptoms. ---Caller states she is working with a patient who has a possible UTI and was prescribed an rx. he is having some confusion and disorientation since taking the medication. She is the morning nurse for patient, Rochele Pages. Medication is Cipro. It started SAturday and didn't stop. UTI- started past Friday. He has been on antibiotic for 3 days. They held his night dose and the saturday dose- the family held it. Maintenance dose of doxycycline- stage IV wound and osteomyelitis, Has the patient traveled out of the country within the last 30 days? ---Not Applicable Does the patient require triage? ---Yes Related visit to physician within the last 2 weeks? ---Yes Does the PT have any chronic conditions? (i.e. diabetes, asthma, etc.) ---Yes List chronic conditions. ---osteomyelitis, stage IV sacral wound, respiratory failure, vent at night with trach,  endocardidit, anemic, tube feeding. Guidelines Guideline Title Affirmed Question Affirmed Notes Nurse Date/Time (Eastern Time) Confusion - Delirium [1] Difficult to awaken or acting confused (disoriented, slurred speech) AND [2] present now AND [3] new onset Idamae Lusher 07/10/2015 1:53:25 PM PLEASE NOTE: All timestamps contained within this report are represented as [place] Standard Time. CONFIDENTIALTY NOTICE: This fax transmission is intended only for the addressee. It contains information that is legally privileged, confidential or otherwise protected from use or disclosure. If you are not the intended recipient, you are strictly prohibited from reviewing, disclosing, copying using or disseminating any of this information or taking any action in reliance on or regarding this information. If you have received this fax in error, please notify us immediately by telephone so that we can arrange for its return to [name]. Phone: [phone], Toll-Free: [phone], Fax: [phone] Page: 2 of 2 Call Id: [id number] [place]. Time Eilene Ghazi Time) Disposition Final User 07/10/2015 1:42:29 PM Send to Urgent Marina Goodell, Cassandra 07/10/2015 1:42:32 PM Send to Urgent Marina Goodell, Cassandra 07/10/2015 1:42:43 PM Send to Urgent Marina Goodell, Cassandra 07/10/2015 1:58:18 PM Call EMS 911 Now Yes Donovan Kail, RN, Ledell Noss Understands: Yes Disagree/Comply: Disagree Disagree/Comply Reason: Declined PCP Triage Care Advice Given Per Guideline CALL EMS 911 NOW: Immediate medical attention is needed. You need to hang up and call 911 (or an ambulance). (Triager Discretion: I'll call you back in a few minutes to be sure you were able to reach them.) CARE ADVICE given per Confusion- Delirium (Adult) guideline. Family feels that it is the antibiotic that is causing the confusion. They refused triage and will call the PCP in the morning. After Care Instructions Given Call Event Type User Date / Time  Description

## 2015-07-11 NOTE — Progress Notes (Signed)
Initial Nutrition Assessment  DOCUMENTATION CODES:   Severe malnutrition in context of chronic illness, Underweight  INTERVENTION:    D/C Glucerna 1.2 formula  Initiate Vital AF 1.2 formula at 20 ml/hr and increase by 10 ml every 4 hours to goal rate of 50 ml/hr  TF regimen to provide 1440 kcals, 90 gm protein, 973 ml of free water  NUTRITION DIAGNOSIS:   Inadequate oral intake related to chronic illness, inability to eat as evidenced by NPO status  GOAL:   Patient will meet greater than or equal to 90% of their needs  MONITOR:   TF tolerance, Vent status, Labs, Weight trends, Skin, I & O's  REASON FOR ASSESSMENT:   Ventilator, Other (Comment) (New Tube Feeding)  ASSESSMENT:   [name] with PMH of spinal cord injury s/p paraplegia who was recently admitted for MRSA and microaerophilic strep bacteremia with respiratory failure requiring trach (vent at night); brought to ER after patient was found to have a hallucinations and confusion. Patient was recently tapered off his IV antibiotics and was placed on doxycycline and Cipro was started 3 days ago for possible UTI. In ER patient UA showed features concerning for UTI and patient has chronic suprapubic catheter. Patient has been admitted for possible developing sepsis.   Pt known to this RD during most recent hospitalization.  Malnutrition is ongoing.  Patient is currently on ventilator support via trach MV: 9.6 L/min Temp (24hrs), Avg:96 F (35.6 C), Min:93.4 F (34.1 C), Max:97.2 F (36.2 C)   Current TF regimen: Glucerna 1.2 formula at 70 ml/hr via PEG tube providing 2016 kcals, 101 gm protein, 1352 ml of free water.  Telephone with readback orders received per Dr. Thereasa Solo to change TF regimen and add Adult Tube Feeding Protocol.  Diet Order:   NPO  Skin:      Left plantar heel with Stage 2 pressure injury Left plantar foot near heel with unstageable pressure injury Left anterior foot near small toe with  deep tissue injury Right ischium with Stage 4 pressure injury Left ischium with Stage 4 pressure injury Left hip with Stage 4 pressure injury Left posterior back with chronic full thickness wound  Last BM:  N/A  CBG (last 3)   Recent Labs  07/11/15 0143 07/11/15 0345 07/11/15 0735  GLUCAP 376* 309* 163*    Height:   Ht Readings from Last 1 Encounters:  07/11/15 5\' 6"  (1.676 m)    Weight:   Wt Readings from Last 1 Encounters:  07/11/15 98 lb 12.3 oz (44.8 kg)   Wt Readings from Last 10 Encounters:  07/11/15 98 lb 12.3 oz (44.8 kg)  07/05/15 99 lb (44.906 kg)  06/01/15 111 lb (50.349 kg)  04/01/15 130 lb (58.968 kg)  02/20/15 111 lb 15.9 oz (50.8 kg)  02/16/15 112 lb (50.803 kg)  01/12/15 128 lb 4.9 oz (58.2 kg)  12/19/14 130 lb 1.1 oz (59 kg)  11/25/14 155 lb (70.308 kg)  11/19/14 155 lb 13.8 oz (70.7 kg)    Ideal Body Weight:  70 kg  BMI:  Body mass index is 15.95 kg/(m^2).  Estimated Nutritional Needs:   Kcal:  9390  Protein:  90-100 gm  Fluid:  per MD  EDUCATION NEEDS:   No education needs identified at this time  Arthur Holms, RD, LDN Pager #: [phone] After-Hours Pager #: [phone]

## 2015-07-11 NOTE — Telephone Encounter (Signed)
He needs to go to the ER. I suspect the confusion is due to sepsis from the UTI and not from the Cipro. Either way he needs intensive treatment.

## 2015-07-11 NOTE — Progress Notes (Signed)
Placed patient on ventilator with help from father. Patient is very agitated and confused, but tolerating the vent well. RT will continue to monitor.

## 2015-07-11 NOTE — Consult Note (Addendum)
WOC wound consult note Reason for Consult: Consult requested for multiple wounds.  Pt is familiar to [facility] team from multiple previous admissions.  He has been followed in the past by Dr Migdalia Dk of the plastics team for wound care orders.  He has a history of chronic osteomyelitis. He is very emeciated with multiple systemic factors which can impair healing. LLQ with colostomy pouch in place with good seal, emptied 300cc liquid brown stool.  Supplies ordered to bedside for staff nurse use. Wound type: Left plantar heel with stage 2 pressure injury; 1X1X.1cm, pink and moist with small amt yellow drainage, no odor. Left plantar foot near heel with unstageable pressure injury; .8X.8cm, 100% tightly adhered slough, small amt tan drainage, no odor Left anterior foot near small toe with .5X.5cm dark purple deep tissue injury. Right ischium with stage 4 pressure injury; 8X6X2 cm with tunneling t 4 cm at 12:00 o'clock, bone visible, wound 90% red, 10% yellow slough.  Mod amt yellow drainage, no odor. Left ischium with stage 4 pressure injury; 7X8X3cm with undermining to 3 cm at 12:00 o'clock.  85% red, 15% yellow slough, bone palpable, mod amt tan drainage, no odor. Left hip with stage 4 pressure injury; 10X2X2.5cm with 2 cm undermining at 12:00 o'clock,  88% red, 20% yellow slough, bone palpable, mod amt tan drainage, no odor. Left posterior back with chronic full thickness wound; 14X14X.1cm, 90% red, 10% yellow, large amt thick tan pus draining with same odor. Pressure Ulcer POA: Yes Dressing procedure/placement/frequency: Aquacel to posterior back to absorb drainage and provide antimicrobial benefits.  Santyl for chemical debridement to left ischium and left hip and left plantar foot to assist with removal of nonviable tissue.  Moist gauze packing to right ischium wound.  Float heels to reduce pressure.  Pt is on a Sport low airloss bed to reduce pressure.  Mother is not at bedside to discuss plan of care. Pt can  resume follow-up with Dr Migdalia Dk of the plastics team after discharge for his chronic wounds. One hour spent performing this consult. Please re-consult if further assistance is needed.  Thank-you,  Julien Girt MSN, [place], [place], [place], [place]

## 2015-07-11 NOTE — Progress Notes (Signed)
Utilization review completed. Shreyansh Tiffany, RN, BSN. 

## 2015-07-11 NOTE — Progress Notes (Signed)
[employer] TEAM 1 - Stepdown/ICU TEAM PROGRESS NOTE  Jason [name]:[id number] DOB: Jan 29, 1986 DOA: 07/10/2015 PCP: [name], MD  Admit HPI / Brief Narrative: 29 y.o. male with history of spinal cord injury > paraplegia who was recently admitted for MRSA and microaerophilic strep bacteremia with respiratory failure requiring trach and chronic nocturnal vent who was brought to the ER after patient was found to be having hallucinations and confusion as per patient's mother. Patient was recently tapered off his IV antibiotics and was placed on doxycycline and Cipro 3 days prior to this admit for possible UTI.   In the ER patient UA shows features concerning for UTI - patient has chronic suprapubic catheter. Patient was also found to be hypothermic and hypotensive. Patient has had previous history of multiple debridement for chronic sacral decubitus which at this time looks to be healing. Patient also has history of chronic osteomyelitis.   HPI/Subjective: Pt is noncommunicative / sedate at the time of my exam.  I spoke to his father at bedside.    Assessment/Plan:  Toxic metabolic encephalopathy Mental status has not yet improved - cont tx of below issues and follow  Sepsis due to UTI  UA convincing for UTI - WBC 19.8 + Temp <36 = sepsis - appears to be responding to IVF and abx - follow culture data - send urine for cx as it appears this was not done in the ED   Acute renal failure Due to sepsis - improving w/ volume expansion   Hyponatremia  likely simple due to hypovolemia - follow w/ volume expansion   Diabetes mellitus type 1 with hyperglycemia Some hypoglycemia today - adjust tx and follow   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 Wound team consulted  Chronic anemia follow CBC w/ hydration   Chronic respiratory failure with trach and vent pulmonary critical care following and managing vent   History of spinal canal injury with paraplegia  Code Status:  FULL Family Communication: spoke w/ father at bedside  Disposition Plan: SDU   Consultants: PCCM - vent management   Procedures: none  Antibiotics: Imipenem 10/2 > Vanc 10/2 >  DVT prophylaxis: lovenox   Objective: Blood pressure 92/45, pulse 61, temperature 97.2 F (36.2 C), temperature source Oral, resp. rate 13, height 5\' 6"  (1.676 m), weight 44.8 kg (98 lb 12.3 oz), SpO2 99 %.  Intake/Output Summary (Last 24 hours) at 07/11/15 0951 Last data filed at 07/11/15 0700  Gross per 24 hour  Intake 1132.33 ml  Output    600 ml  Net 532.33 ml   Exam: General: No acute respiratory distress though RR somewhat low at 8-10 bpm  Lungs: Clear to auscultation bilaterally without wheezes or crackles Cardiovascular: Regular rate and rhythm without murmur gallop or rub  Abdomen: Nontender, nondistended, soft, bowel sounds positive, no rebound, no ascites, no appreciable mass - PEG insertion clean and dry  Extremities: No significant cyanosis, clubbing, or edema bilateral lower extremities - muscle wasting B LE   Data Reviewed: Basic Metabolic Panel:  Recent Labs Lab 07/10/15 1633 07/10/15 1646 07/11/15 0440  NA 128* 128* 127*  K 5.1 5.1 4.8  CL 104 105 103  CO2 15*  --  14*  GLUCOSE 289* 301* 308*  BUN 71* 79* 68*  CREATININE 1.43* 1.50* 1.29*  CALCIUM 10.6*  --  9.7    CBC:  Recent Labs Lab 07/10/15 1633 07/10/15 1646 07/11/15 0440  WBC 19.8*  --  15.0*  NEUTROABS 14.0*  --  13.1*  HGB 8.7* 10.5* 7.9*  HCT 26.9* 31.0* 25.2*  MCV 84.3  --  83.4  PLT 787*  --  669*    Liver Function Tests:  Recent Labs Lab 07/10/15 1633 07/11/15 0440  AST 13* 20  ALT 14* 11*  ALKPHOS 139* 139*  BILITOT 0.5 0.3  PROT 7.7 7.1  ALBUMIN 2.3* 2.0*   Coags:  Recent Labs Lab 07/11/15 0440  INR 1.18    Recent Labs Lab 07/11/15 0440  APTT 45*   CBG:  Recent Labs Lab 07/11/15 0143 07/11/15 0345 07/11/15 0735  GLUCAP 376* 309* 163*    Recent Results (from  the past 240 hour(s))  MRSA PCR Screening     Status: None   Collection Time: 07/11/15  1:00 AM  Result Value Ref Range Status   MRSA by PCR NEGATIVE NEGATIVE Final    Comment:        The GeneXpert MRSA Assay (FDA approved for NASAL specimens only), is one component of a comprehensive MRSA colonization surveillance program. It is not intended to diagnose MRSA infection nor to guide or monitor treatment for MRSA infections.      Studies:   Recent x-ray studies have been reviewed in detail by the Attending Physician  Scheduled Meds:  Scheduled Meds: . antiseptic oral rinse  7 mL Mouth Rinse QID  . baclofen  10 mg Per Tube 2 times per day  . baclofen  20 mg Per Tube QHS  . chlorhexidine gluconate  15 mL Mouth Rinse BID  . collagenase  1 application Topical Q M,W,F  . enoxaparin (LOVENOX) injection  30 mg Subcutaneous Q24H  . [START ON 07/14/2015] fentaNYL  50 mcg Transdermal Q72H  . imipenem-cilastatin  250 mg Intravenous 3 times per day  . insulin aspart  0-9 Units Subcutaneous 6 times per day  . insulin glargine  15 Units Subcutaneous QHS  . loratadine  10 mg Per Tube QHS  . midodrine  30 mg Per Tube TID WC  . multivitamin with minerals  1 tablet Per Tube Daily  . pantoprazole sodium  40 mg Per Tube Daily  . pregabalin  100 mg Per Tube 3 times per day  . pregabalin  200 mg Per Tube QHS  . sodium chloride  500 mL Intravenous Once  . vancomycin  500 mg Intravenous Q24H    Time spent on care of this patient: 35 mins   Isabela Nardelli T , MD   [facility] Office  [phone] Pager - Text Page per Shea Evans as per below:  On-Call/Text Page:      [email or web address]      password [email or web address]  If 7PM-7AM, please contact night-coverage [email or web address] Password [email or web address] 07/11/2015, 9:51 AM   LOS: 1 day

## 2015-07-11 NOTE — Progress Notes (Signed)
Patient placed on 28% trach collar.  Currently tolerating well.

## 2015-07-11 NOTE — Progress Notes (Signed)
Attempted to place patient on trach collar.  Patient had a decrease in respiratory rate to total of 5.  Placed back on ventilator and currently tolerating well.  Will continue to monitor.

## 2015-07-11 NOTE — Progress Notes (Signed)
[place] Progress Note Patient Name: Jason [name] DOB: 1986-01-28 MRN: [id number]   Date of Service  07/11/2015  HPI/Events of Note  Chronic noct vent & ATC daytime - reviewed trach clinic & prior admit progress note C spine injury  eICU Interventions  Noct vent orders written [facility] per [facility]     Intervention Category Evaluation Type: New Patient Evaluation  Foye Damron V. 07/11/2015, 1:15 AM

## 2015-07-11 NOTE — Plan of Care (Signed)
Problem: Phase I Progression Outcomes Goal: Voiding-avoid urinary catheter unless indicated Outcome: Not Applicable Date Met:  44/92/52 Pt with paraplegia and suprapubic cath in place.

## 2015-07-11 NOTE — Progress Notes (Signed)
Patient wanted to go back on trach collar.  Patient is currently on 28% trach collar, currently tolerating well.  Will continue to monitor.

## 2015-07-11 NOTE — Progress Notes (Signed)
Placed patient on ventilator and he tolerated it for about 10-15 minutes. Per patient's mother, home vent settings are: RR 15/Vt 570/Peep +3. Vent settings here are RR 15/Vt 540 (patient's 8cc)/ Peep of +3.  Due to increased anxiety and father's request, removed patient from ventilator and placed ATC back on until anxiety medication is verified by pharmacy and given. RT will continue to monitor.

## 2015-07-11 NOTE — Telephone Encounter (Signed)
Pt is currently in [place], Dr. Sarajane Jews is aware.

## 2015-07-12 DIAGNOSIS — R4182 Altered mental status, unspecified: Secondary | ICD-10-CM

## 2015-07-12 DIAGNOSIS — L89319 Pressure ulcer of right buttock, unspecified stage: Secondary | ICD-10-CM

## 2015-07-12 DIAGNOSIS — Z930 Tracheostomy status: Secondary | ICD-10-CM | POA: Diagnosis present

## 2015-07-12 DIAGNOSIS — Z87828 Personal history of other (healed) physical injury and trauma: Secondary | ICD-10-CM | POA: Diagnosis present

## 2015-07-12 DIAGNOSIS — G8220 Paraplegia, unspecified: Secondary | ICD-10-CM | POA: Diagnosis present

## 2015-07-12 DIAGNOSIS — L89324 Pressure ulcer of left buttock, stage 4: Secondary | ICD-10-CM

## 2015-07-12 LAB — COMPREHENSIVE METABOLIC PANEL
ALBUMIN: 1.8 g/dL — AB (ref 3.5–5.0)
ALT: 13 U/L — ABNORMAL LOW (ref 17–63)
ANION GAP: 7 (ref 5–15)
AST: 18 U/L (ref 15–41)
Alkaline Phosphatase: 127 U/L — ABNORMAL HIGH (ref 38–126)
BILIRUBIN TOTAL: 0.3 mg/dL (ref 0.3–1.2)
BUN: 60 mg/dL — ABNORMAL HIGH (ref 6–20)
CO2: 14 mmol/L — ABNORMAL LOW (ref 22–32)
Calcium: 9.1 mg/dL (ref 8.9–10.3)
Chloride: 112 mmol/L — ABNORMAL HIGH (ref 101–111)
Creatinine, Ser: 1.05 mg/dL (ref 0.61–1.24)
GFR calc non Af Amer: >60 mL/min (ref >60)
GLUCOSE: 90 mg/dL (ref 65–99)
POTASSIUM: 4.9 mmol/L (ref 3.5–5.1)
Sodium: 133 mmol/L — ABNORMAL LOW (ref 135–145)
TOTAL PROTEIN: 6.5 g/dL (ref 6.5–8.1)

## 2015-07-12 LAB — GLUCOSE, CAPILLARY
GLUCOSE-CAPILLARY: 126 mg/dL — AB (ref 65–99)
GLUCOSE-CAPILLARY: 128 mg/dL — AB (ref 65–99)
GLUCOSE-CAPILLARY: 130 mg/dL — AB (ref 65–99)
GLUCOSE-CAPILLARY: 139 mg/dL — AB (ref 65–99)
GLUCOSE-CAPILLARY: 148 mg/dL — AB (ref 65–99)
GLUCOSE-CAPILLARY: 67 mg/dL (ref 65–99)
Glucose-Capillary: 107 mg/dL — ABNORMAL HIGH (ref 65–99)
Glucose-Capillary: 80 mg/dL (ref 65–99)
Glucose-Capillary: 98 mg/dL (ref 65–99)

## 2015-07-12 LAB — CBC
HCT: 22.5 % — ABNORMAL LOW (ref 39.0–52.0)
HEMOGLOBIN: 7.1 g/dL — AB (ref 13.0–17.0)
MCH: 26.2 pg (ref 26.0–34.0)
MCHC: 31.6 g/dL (ref 30.0–36.0)
MCV: 83 fL (ref 78.0–100.0)
PLATELETS: 614 10*3/uL — AB (ref 150–400)
RBC: 2.71 MIL/uL — ABNORMAL LOW (ref 4.22–5.81)
RDW: 15.2 % (ref 11.5–15.5)
WBC: 16.4 10*3/uL — ABNORMAL HIGH (ref 4.0–10.5)

## 2015-07-12 LAB — PREPARE RBC (CROSSMATCH)

## 2015-07-12 MED ORDER — SODIUM CHLORIDE 0.9 % IV SOLN
Freq: Once | INTRAVENOUS | Status: AC
  Administered 2015-07-12: 22:00:00 via INTRAVENOUS

## 2015-07-12 MED ORDER — SODIUM CHLORIDE 0.9 % IV SOLN: Freq: Once | INTRAVENOUS | Status: DC

## 2015-07-12 MED ORDER — DEXTROSE 50 % IV SOLN
25.0000 mL | Freq: Once | INTRAVENOUS | Status: AC
  Administered 2015-07-12: 25 mL via INTRAVENOUS

## 2015-07-12 NOTE — Progress Notes (Signed)
[place] TEAM 1 - Stepdown/ICU TEAM Progress Note  DOUGLAS SMOLINSKY EQA:[id number] DOB: [birth year] DOA: 07/10/2015 PCP: [name], MD  Admit HPI / Brief Narrative: Jason [name] is a 29 y.o. BM PMHx Spinal cord injury status post paraplegia who was recently admitted for MRSA and microaerophilic strep bacteremia with respiratory failure requiring trach and is the patient is on vent in the nights was brought to the ER after patient was found to have a hallucination and confusion as per patient's mother. Patient was recently tapered off his IV antibiotics and was placed on doxycycline and Cipro was started 3 days ago for possible UTI.   Over the last 3 days patient was noticed to have increasing confusion and hallucination as per patient's mother. In the ER patient UA shows features concerning for UTI and patient has chronic suprapubic catheter. Patient is also found to be hypothermic and was also hypotensive which improved with fluid bolus. Patient has had previous history of multiple debridement for chronic sacral decubitus which at this time looks healing. Patient also has history of chronic osteomyelitis. Patient has been admitted for possible developing sepsis. On call pulmonary critical care has been consulted and patient has been admitted for further management.  HPI/Subjective: 10/4 A/O 4, lethargic but answers questions appropriately.   Assessment/Plan: Toxic metabolic encephalopathy -Mental status improved   Sepsis due to UTI  -UA convincing for UTI - WBC 19.8 + Temp <36 = sepsis - appears to be responding to IVF and abx  -Complete 5 day course of antibiotics - Urine and blood cultures pending    Acute renal failure -Due to sepsis - improving w/ volume expansion   Hyponatremia  likely simple due to hypovolemia  -Improving with hydration   Diabetes mellitus type 1 with hyperglycemia -Well controlled - Continue Lantus 10 units daily -Continue sensitive SSI  Sacral  decubitus/bilateral ischeal ulcers and left chest wall decubitus -Wound team consulted -Hydrotherapy daily -After a few days of hydrotherapy will consult for wound Vac -[place] Bed maintain in rotational mode  Chronic anemia -Worsening secondary to hemodilution  -Transfuse for hemoglobin <7  -10/4 Transfuse 2 units PRBC   Chronic respiratory failure with trach and vent -pulmonary critical care following and managing vent  -PCXR in a.m.  History of spinal cord injury with paraplegia  Goals of care -Palliative care consult placed. Patient with spinal cord injury resulting in paraplegia, indwelling cath with multiple infections, nonhealing multiple decubitus ulcers to sacrum and bilateral buttocks resulting in frequent hospitalization; short-term vs long-term goals of care   Code Status: FULL Family Communication: Caregiver present at time of exam Disposition Plan: SNF? Will await recommendations from palliative care    Consultants: PCCM - vent management   Procedure/Significant Events:    Culture 10/2 blood left on NGTD 10/3 blood right forearm NGTD 10/3 urine pending 10/3 MRSA by PCR negative  Antibiotics: Imipenem 10/2 > Vanc 10/2 >  DVT prophylaxis: lovenox    Devices    LINES / TUBES:      Continuous Infusions: . sodium chloride 100 mL/hr at 07/12/15 1426  . feeding supplement (VITAL AF 1.2 CAL) 1,000 mL (07/12/15 0600)    Objective: VITAL SIGNS: Temp: 96 F (35.6 C) (10/04 1200) Temp Source: Oral (10/04 1513) BP: 109/59 mmHg (10/04 1700) Pulse Rate: 72 (10/04 1700) SPO2; FIO2:   Intake/Output Summary (Last 24 hours) at 07/12/15 1716 Last data filed at 07/12/15 1700  Gross per 24 hour  Intake   3790 ml  Output  1410 ml  Net   2380 ml     Exam: General: A/O 4, NAD, No acute respiratory distress Eyes: Negative headache, eye pain, double vision,negative scleral hemorrhage ENT: Negative Runny nose, negative ear pain, negative  gingival bleeding, Neck:  Negative scars, masses, torticollis, lymphadenopathy, JVD Lungs: Clear to auscultation bilaterally without wheezes or crackles Cardiovascular: Regular rate and rhythm without murmur gallop or rub normal S1 and S2 Abdomen:negative abdominal pain, nondistended, positive soft, bowel sounds, no rebound, no ascites, no appreciable mass, suprapubic catheter in place negative discharge or sign of infection Extremities: atrophy, large stage 4 sacral decubitus ulcer, bilateral ischeal stage IV decubitus ulcers, unstageable left heel and toe ulcer Psychiatric:  Negative depression, negative anxiety, negative fatigue, negative mania  Neurologic:  Cranial nerves II through XII intact, tongue/uvula midline, bilateral upper extremity gross muscles strength 2/5, bilateral lower extremity paralysis negative dysarthria, negative expressive aphasia, negative receptive aphasia.   Data Reviewed: Basic Metabolic Panel:  Recent Labs Lab 07/10/15 1633 07/10/15 1646 07/11/15 0440 07/12/15 0310  NA 128* 128* 127* 133*  K 5.1 5.1 4.8 4.9  CL 104 105 103 112*  CO2 15*  --  14* 14*  GLUCOSE 289* 301* 308* 90  BUN 71* 79* 68* 60*  CREATININE 1.43* 1.50* 1.29* 1.05  CALCIUM 10.6*  --  9.7 9.1   Liver Function Tests:  Recent Labs Lab 07/10/15 1633 07/11/15 0440 07/12/15 0310  AST 13* 20 18  ALT 14* 11* 13*  ALKPHOS 139* 139* 127*  BILITOT 0.5 0.3 0.3  PROT 7.7 7.1 6.5  ALBUMIN 2.3* 2.0* 1.8*   No results for input(s): LIPASE, AMYLASE in the last 168 hours. No results for input(s): AMMONIA in the last 168 hours. CBC:  Recent Labs Lab 07/10/15 1633 07/10/15 1646 07/11/15 0440 07/12/15 0310  WBC 19.8*  --  15.0* 16.4*  NEUTROABS 14.0*  --  13.1*  --   HGB 8.7* 10.5* 7.9* 7.1*  HCT 26.9* 31.0* 25.2* 22.5*  MCV 84.3  --  83.4 83.0  PLT 787*  --  669* 614*   Cardiac Enzymes: No results for input(s): CKTOTAL, CKMB, CKMBINDEX, TROPONINI in the last 168 hours. BNP (last  3 results) No results for input(s): BNP in the last 8760 hours.  ProBNP (last 3 results) No results for input(s): PROBNP in the last 8760 hours.  CBG:  Recent Labs Lab 07/12/15 0055 07/12/15 0349 07/12/15 0843 07/12/15 0901 07/12/15 1213  GLUCAP 107* 80 130* 128* 139*    Recent Results (from the past 240 hour(s))  Blood culture (routine x 2)     Status: None (Preliminary result)   Collection Time: 07/10/15  4:25 PM  Result Value Ref Range Status   Specimen Description BLOOD LEFT ARM  Final   Special Requests BOTTLES DRAWN AEROBIC AND ANAEROBIC 5CC  Final   Culture NO GROWTH 2 DAYS  Final   Report Status PENDING  Incomplete  MRSA PCR Screening     Status: None   Collection Time: 07/11/15  1:00 AM  Result Value Ref Range Status   MRSA by PCR NEGATIVE NEGATIVE Final    Comment:        The GeneXpert MRSA Assay (FDA approved for NASAL specimens only), is one component of a comprehensive MRSA colonization surveillance program. It is not intended to diagnose MRSA infection nor to guide or monitor treatment for MRSA infections.   Blood culture (routine x 2)     Status: None (Preliminary result)   Collection Time: 07/11/15  4:19 AM  Result Value Ref Range Status   Specimen Description BLOOD RIGHT FOREARM  Final   Special Requests BOTTLES DRAWN AEROBIC ONLY 2CC  Final   Culture NO GROWTH 1 DAY  Final   Report Status PENDING  Incomplete  Urine culture     Status: None (Preliminary result)   Collection Time: 07/11/15  5:09 PM  Result Value Ref Range Status   Specimen Description URINE, RANDOM  Final   Special Requests NONE  Final   Culture TOO YOUNG TO READ  Final   Report Status PENDING  Incomplete     Studies:  Recent x-ray studies have been reviewed in detail by the Attending Physician  Scheduled Meds:  Scheduled Meds: . antiseptic oral rinse  7 mL Mouth Rinse QID  . baclofen  10 mg Per Tube 2 times per day  . baclofen  20 mg Per Tube QHS  . chlorhexidine  gluconate  15 mL Mouth Rinse BID  . collagenase   Topical Daily  . enoxaparin (LOVENOX) injection  30 mg Subcutaneous Daily  . [START ON 07/14/2015] fentaNYL  50 mcg Transdermal Q72H  . imipenem-cilastatin  250 mg Intravenous Q12H  . insulin aspart  0-9 Units Subcutaneous 6 times per day  . insulin glargine  10 Units Subcutaneous QHS  . loratadine  10 mg Per Tube QHS  . midodrine  30 mg Per Tube TID WC  . multivitamin with minerals  1 tablet Per Tube Daily  . pantoprazole sodium  40 mg Per Tube Daily  . pregabalin  100 mg Per Tube 3 times per day  . pregabalin  200 mg Per Tube QHS  . vancomycin  500 mg Intravenous Q24H    Time spent on care of this patient: 40 mins   Clarke Amburn, Geraldo Docker , MD  [facility] Office  [phone] Pager [phone]  On-Call/Text Page:      [email or web address]      password [email or web address]  If 7PM-7AM, please contact night-coverage [email or web address] Password [email or web address] 07/12/2015, 5:16 PM   LOS: 2 days   Care during the described time interval was provided by me .  I have reviewed this patient's available data, including medical history, events of note, physical examination, and all test results as part of my evaluation. I have personally reviewed and interpreted all radiology studies.   Dia Crawford, MD [phone] Pager

## 2015-07-12 NOTE — Progress Notes (Signed)
Hypoglycemic Event  CBG: 67  Treatment: D50 IV 25 mL  Symptoms: None  Follow-up CBG: Time:0055 CBG Result: 107  Possible Reasons for Event: Inadequate meal intake  Comments/MD notified: Change in tube feeding today, increasing pt's rate per protocol.    Archie Endo L  Remember to initiate Hypoglycemia Order Set & complete

## 2015-07-12 NOTE — Clinical Documentation Improvement (Signed)
Internal Medicine  A cause and effect relationship may not be assumed and must be documented by a provider.  Please clarify the relationship, if any, between the patient's UTI and the chronic suprapubic catheter; please document in your future progress notes and discharge summary.    Are the conditions:   Due to or associated with each other (UTI due to / related to chronic suprapubic catheter)  Unrelated to each other  Other  Clinically Undetermined   Supporting Information (risk factors, sign and symptoms, diagnostics, treatment): Current documentation note "UA shows features concerning for UTI - patient has chronic suprapubic catheter."    Please exercise your independent, professional judgment when responding. A specific answer is not anticipated or expected.   Thank You,  [place] [phone]

## 2015-07-12 NOTE — Progress Notes (Signed)
Hypoglycemic Event  CBG: 48  Treatment: D50 IV 25 mL  Symptoms: None  Follow-up CBG: Time:2022 CBG Result:101  Possible Reasons for Event: Inadequate meal intake  Comments/MD notified: Change in tube feeding today.    Archie Endo L  Remember to initiate Hypoglycemia Order Set & complete

## 2015-07-13 ENCOUNTER — Inpatient Hospital Stay (HOSPITAL_COMMUNITY): Payer: Medicaid Other

## 2015-07-13 ENCOUNTER — Encounter (HOSPITAL_COMMUNITY): Payer: Self-pay | Admitting: *Deleted

## 2015-07-13 LAB — COMPREHENSIVE METABOLIC PANEL
ALBUMIN: 1.8 g/dL — AB (ref 3.5–5.0)
ALT: 11 U/L — ABNORMAL LOW (ref 17–63)
ANION GAP: 6 (ref 5–15)
AST: 18 U/L (ref 15–41)
Alkaline Phosphatase: 146 U/L — ABNORMAL HIGH (ref 38–126)
BUN: 51 mg/dL — AB (ref 6–20)
CHLORIDE: 113 mmol/L — AB (ref 101–111)
CO2: 14 mmol/L — AB (ref 22–32)
Calcium: 8.9 mg/dL (ref 8.9–10.3)
Creatinine, Ser: 0.89 mg/dL (ref 0.61–1.24)
GFR calc Af Amer: >60 mL/min (ref >60)
GFR calc non Af Amer: >60 mL/min (ref >60)
GLUCOSE: 254 mg/dL — AB (ref 65–99)
POTASSIUM: 5 mmol/L (ref 3.5–5.1)
SODIUM: 133 mmol/L — AB (ref 135–145)
Total Bilirubin: 0.4 mg/dL (ref 0.3–1.2)
Total Protein: 6.9 g/dL (ref 6.5–8.1)

## 2015-07-13 LAB — GLUCOSE, CAPILLARY
GLUCOSE-CAPILLARY: 192 mg/dL — AB (ref 65–99)
GLUCOSE-CAPILLARY: 269 mg/dL — AB (ref 65–99)
Glucose-Capillary: 252 mg/dL — ABNORMAL HIGH (ref 65–99)
Glucose-Capillary: 259 mg/dL — ABNORMAL HIGH (ref 65–99)
Glucose-Capillary: 235 mg/dL — ABNORMAL HIGH (ref 65–99)

## 2015-07-13 LAB — CBC
HEMATOCRIT: 33.8 % — AB (ref 39.0–52.0)
Hemoglobin: 11.1 g/dL — ABNORMAL LOW (ref 13.0–17.0)
MCH: 27.8 pg (ref 26.0–34.0)
MCHC: 32.8 g/dL (ref 30.0–36.0)
MCV: 84.5 fL (ref 78.0–100.0)
PLATELETS: 630 10*3/uL — AB (ref 150–400)
RBC: 4 MIL/uL — ABNORMAL LOW (ref 4.22–5.81)
RDW: 14.7 % (ref 11.5–15.5)
WBC: 20.3 10*3/uL — AB (ref 4.0–10.5)

## 2015-07-13 MED ORDER — SODIUM CHLORIDE 0.9 % IV SOLN
250.0000 mg | Freq: Three times a day (TID) | INTRAVENOUS | Status: AC
  Administered 2015-07-13 – 2015-07-19 (×19): 250 mg via INTRAVENOUS
  Filled 2015-07-13 (×21): qty 250

## 2015-07-13 MED ORDER — OXYCODONE HCL 5 MG/5ML PO SOLN
20.0000 mg | ORAL | Status: DC | PRN
  Administered 2015-07-13 – 2015-07-19 (×12): 20 mg
  Filled 2015-07-13 (×12): qty 20

## 2015-07-13 NOTE — Progress Notes (Signed)
ANTIBIOTIC CONSULT NOTE - INITIAL  Pharmacy Consult for Vancomycin/Primaxin  Indication: rule out sepsis  Allergies  Allergen Reactions  . Cefuroxime Axetil Anaphylaxis  . Ertapenem Other (See Comments)    Rash and confusion-->tolerated Imipenem   . Morphine And Related Other (See Comments)    Changed mental status, confusion, headache, visual hallucination  . Penicillins Anaphylaxis and Other (See Comments)    Tolerated Imipenem; no reaction to 7 day course of amoxicillin in 2015 Has patient had a PCN reaction causing immediate rash, facial/tongue/throat swelling, SOB or lightheadedness with hypotension: Yes Has patient had a PCN reaction causing severe rash involving mucus membranes or skin necrosis: No Has patient had a PCN reaction that required hospitalization Yes Has patient had a PCN reaction occurring within the last 10 years: No If all of the above answers are "NO", then may proceed with Cephalo  . Sulfa Antibiotics Anaphylaxis, Shortness Of Breath and Other (See Comments)  . Tessalon [Benzonatate] Anaphylaxis  . Shellfish Allergy Itching and Other (See Comments)    Took benadryl to alleviate reaction  . Nsaids Other (See Comments)    Risk of bleeding  . Miripirium Rash and Other (See Comments)    Change in mental status    Patient Measurements: ~45 kg  Vital Signs: Temp: 96.7 F (35.9 C) (10/05 0757) Temp Source: Oral (10/05 0757) BP: 114/66 mmHg (10/05 1000) Pulse Rate: 69 (10/05 1000)  Labs:  Recent Labs  07/11/15 0440 07/12/15 0310 07/13/15 0321  WBC 15.0* 16.4* 20.3*  HGB 7.9* 7.1* 11.1*  PLT 669* 614* 630*  CREATININE 1.29* 1.05 0.89   Estimated Creatinine Clearance: 81.6 mL/min (by C-G formula based on Cr of 0.89).  Medical History: Past Medical History  Diagnosis Date  . GERD (gastroesophageal reflux disease)   . Asthma   . Hx MRSA infection     on face  . Gastroparesis   . Diabetic neuropathy ([place])   . Seizures ([place])   . Family history  of anesthesia complication     Pt mother can't have epidural procedures  . Dysrhythmia   . Pneumonia   . Arthritis   . Fibromyalgia   . 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 Type I diabetes mellitus ([place])     sees Dr. Loanne Drilling   . 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 Patient also has recurrent pelvic osteomyelitis, CT of the pelvis - negative cor active osteomyelitis. Wbc 19.8 >>16.4 > 20.3. Hypothermic. Pt. Had AKI on admission, which has been improving with fluid, scr 1.5 >> 0.89 (BL ~ 0.4)  Noted: Pt was also recently (9/30) started doxycycline by ID as outpatient in case he had residual osteo.   Vancomycin 10/3 >> Primaxin 10/3 >>  10/2 Blood - ngtd 10/3 Blood - ngtd 10/3 urine - ngtd  Goal of Therapy:  Vancomycin trough level 15-20 mcg/ml  Plan: -Continue Vancomycin 500 mg IV q24h -Change Primaxin to 250 mg IV q8 -Trend WBC, temp, Scr - Will consider checking vanc trough tomorrow if continues.  [name], PharmD, BCPS  Clinical Pharmacist  Pager: [phone]   07/13/2015,10:47 AM

## 2015-07-13 NOTE — Progress Notes (Signed)
Nutrition Consult/Follow Up  DOCUMENTATION CODES:   Severe malnutrition in context of chronic illness, Underweight  INTERVENTION:   Continue Vital AF 1.2 formula at goal rate of 50 ml/hr  TF regimen to provide 1440 kcals, 90 gm protein, 973 ml of free water  NUTRITION DIAGNOSIS:   Inadequate oral intake related to chronic illness, inability to eat as evidenced by NPO status, ongoing  GOAL:   Patient will meet greater than or equal to 90% of their needs, met  MONITOR:   TF tolerance, Vent status, Labs, Weight trends, Skin, I & O's  ASSESSMENT:   [name] with PMH of spinal cord injury s/p paraplegia who was recently admitted for MRSA and microaerophilic strep bacteremia with respiratory failure requiring trach (vent at night); brought to ER after patient was found to have a hallucinations and confusion. Patient was recently tapered off his IV antibiotics and was placed on doxycycline and Cipro was started 3 days ago for possible UTI. In ER patient UA showed features concerning for UTI and patient has chronic suprapubic catheter. Patient has been admitted for possible developing sepsis.   RD re-consulted for enteral/tube feeding initiation and management.  Pt known to this RD during most recent and previous hospitalizations.  Malnutrition identified and ongoing.  Patient is currently on ventilator support via trach MV: 7.8 L/min Temp (24hrs), Avg:98 F (36.7 C), Min:96.7 F (35.9 C), Max:98.6 F (37 C)   Vital AF 1.2 (peptide-based advanced formula) infusing via PEG tube at 50 ml/hr providing 1440 kcals, 90 gm protein, 973 ml of free water.  Diet Order:   NPO  Skin:      Left plantar heel with Stage 2 pressure injury Left plantar foot near heel with unstageable pressure injury Left anterior foot near small toe with deep tissue injury Right ischium with Stage 4 pressure injury Left ischium with Stage 4 pressure injury Left hip with Stage 4 pressure injury Left  posterior back with chronic full thickness wound  Last BM:  N/A  CBG (last 3)   Recent Labs  07/12/15 2348 07/13/15 0350 07/13/15 0752  GLUCAP 148* 252* 192*    Height:   Ht Readings from Last 1 Encounters:  07/11/15 5' 6"  (1.676 m)    Weight:   Wt Readings from Last 1 Encounters:  07/13/15 105 lb 13.1 oz (48 kg)   Wt Readings from Last 10 Encounters:  07/13/15 105 lb 13.1 oz (48 kg)  07/05/15 99 lb (44.906 kg)  06/01/15 111 lb (50.349 kg)  04/01/15 130 lb (58.968 kg)  02/20/15 111 lb 15.9 oz (50.8 kg)  02/16/15 112 lb (50.803 kg)  01/12/15 128 lb 4.9 oz (58.2 kg)  12/19/14 130 lb 1.1 oz (59 kg)  11/25/14 155 lb (70.308 kg)  11/19/14 155 lb 13.8 oz (70.7 kg)    Ideal Body Weight:  70 kg  BMI:  Body mass index is 17.09 kg/(m^2).  Estimated Nutritional Needs:   Kcal:  1529  Protein:  90-100 gm  Fluid:  per MD  EDUCATION NEEDS:   No education needs identified at this time  Arthur Holms, RD, LDN Pager #: [phone] After-Hours Pager #: [phone]

## 2015-07-13 NOTE — Progress Notes (Signed)
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 (Active)  Dressing Type ABD;Barrier Film (skin prep);Gauze (Comment);Moist to dry;Tape dressing;Other (Comment) 07/13/2015  9:44 AM  Dressing Changed;Clean;Intact;Dry 07/13/2015  9:44 AM  Dressing Change Frequency Daily 07/13/2015  9:44 AM  State of Healing Early/partial granulation 07/13/2015  9:44 AM  Site / Wound Assessment Granulation tissue;Pink;Red;Yellow;Brown 07/13/2015  9:44 AM  % Wound base Red or Granulating 70% 07/13/2015  9:44 AM  % Wound base Yellow 20% 07/13/2015  9:44 AM  % Wound base Other (Comment) 10% 07/13/2015  9:44 AM  Peri-wound Assessment Bleeding;Pink 07/13/2015  9:44 AM  Wound Length (cm) 10.4 cm 07/13/2015  9:44 AM  Wound Width (cm) 12.4 cm 07/13/2015  9:44 AM  Wound Depth (cm) 2.9 cm 07/13/2015  9:44 AM  Undermining (cm) From 8:00 to 12:00 3.5 cm - From 12:00 to 3:00 4.0 cm 07/13/2015  9:44 AM  Margins Unattached edges (unapproximated) 07/13/2015  9:44 AM  Drainage Amount Moderate 07/13/2015  9:44 AM  Drainage Description Serosanguineous;Odor 07/13/2015  9:44 AM  Treatment Cleansed;Debridement (Selective);Hydrotherapy (Pulse lavage);Packing (Saline gauze);Tape changed;Other (Comment) 07/13/2015  9:44 AM        Pressure Ulcer 07/11/15 Stage IV - Full thickness tissue loss with exposed bone, tendon or muscle. (Active)  Dressing Type Tape dressing;ABD;Barrier Film (skin prep);Gauze (Comment);Moist  to dry 07/13/2015  9:44 AM  Dressing Changed;Clean;Dry;Intact 07/13/2015  9:44 AM  Dressing Change Frequency Daily 07/13/2015  9:44 AM  State of Healing Early/partial granulation 07/13/2015  9:44 AM  Site / Wound Assessment Pink;Red;Yellow;Granulation tissue 07/13/2015  9:44 AM  % Wound base Red or Granulating 75% 07/13/2015  9:44 AM  % Wound base Yellow 25% 07/13/2015  9:44 AM  Peri-wound Assessment Intact 07/13/2015  9:44 AM  Wound Length (cm) 8.4 cm 07/13/2015  9:44 AM  Wound Width (cm) 7.3 cm 07/13/2015  9:44 AM  Wound Depth (cm) 2.7 cm 07/13/2015  9:44 AM  Undermining (cm) 4.2 cm from 12:00 to 1:00 07/13/2015  9:44 AM  Margins Unattached edges (unapproximated) 07/13/2015  9:44 AM  Drainage Amount Moderate 07/13/2015  9:44 AM  Drainage Description Serosanguineous;Odor 07/13/2015  9:44 AM  Treatment Cleansed;Debridement (Selective);Hydrotherapy (Pulse lavage);Other (Comment);Packing (Saline gauze);Tape changed 07/13/2015  9:44 AM        Santyl applied to wound bed prior to applying dressing.  Hydrotherapy Pulsed lavage therapy - wound location: Lt ischial tuberosity and sacrum Pulsed Lavage with Suction (psi): 8 psi Pulsed Lavage with Suction - Normal Saline Used: 1000 mL Pulsed Lavage Tip: Tip with splash shield Selective Debridement Selective Debridement - Location: Lt ischial tuberosity and Sacrum Selective Debridement - Tools Used: Scissors;Forceps Selective Debridement - Tissue Removed: Yellow slough, Brown eschar   Wound Assessment and Plan  Wound Therapy - Assess/Plan/Recommendations Wound Therapy - Clinical Statement: Continue with hydro therapy. Spoke with MD, and due to well healed nature of Lt ischial tuberosity wound, hydrotherapy not indicated at his time. Wound Therapy - Functional Problem List: Pt unable to tolerate prolonged sitting due to pressure ulcers over  sacrum and ischial tuberosities Factors Delaying/Impairing Wound Healing: Incontinence;Infection -  systemic/local;Polypharmacy;Diabetes Mellitus;Altered sensation;Immobility;Multiple medical problems Hydrotherapy Plan: Debridement;Dressing change;Patient/family education;Pulsatile lavage with suction Wound Therapy - Frequency: 6X / week Wound Therapy - Follow Up Recommendations: Other (comment) (LTACH vs SNF) Wound Plan: see above  Wound Therapy Goals- Improve the function of patient's integumentary system by progressing the wound(s) through the phases of wound healing (inflammation - proliferation - remodeling) by: Decrease Necrotic Tissue to: 10 Decrease Necrotic Tissue - Progress: Goal set today Increase Granulation Tissue to: 90 Increase Granulation Tissue - Progress: Goal set today Improve Drainage Characteristics: Min Improve Drainage Characteristics - Progress: Goal set today Goals/treatment plan/discharge plan were made with and agreed upon by patient/family: Yes Time For Goal Achievement: 7 days Wound Therapy - Potential for Goals: Good  Goals will be updated until maximal potential achieved or discharge criteria met.  Discharge criteria: when goals achieved, discharge from hospital, MD decision/surgical intervention, no progress towards goals, refusal/missing three consecutive treatments without notification or medical reason.  GP     Candie Mile S 07/13/2015, 11:44 AM Elayne Snare, [place]

## 2015-07-13 NOTE — Progress Notes (Signed)
[facility] TEAM 1 - Stepdown/ICU TEAM PROGRESS NOTE  Jason [name] JSE:[id number] DOB: 11/26/1985 DOA: 07/10/2015 PCP: [name], MD  Admit HPI / Brief Narrative: 29 y.o. 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 Patient was recently tapered off his IV antibiotics and was placed on doxycycline and Cipro 3 days prior to this admit for possible UTI.   In the ER patient UA shows features concerning for UTI - patient has chronic suprapubic catheter. Patient was also found to be hypothermic and hypotensive. Patient has had previous history of multiple debridement for chronic sacral decubitus which at this time looks to be healing. Patient also has history of chronic osteomyelitis.   HPI/Subjective: Pt is much more alert than during my last visit.  He denies cp or sob, but c/o abdom pain, and pain in his back and sacral wounds.     Assessment/Plan:  Toxic metabolic encephalopathy Mental status signif improved today - c/o pain, but will need to be careful not to sedate too heavily w/ pain meds   Sepsis due to UTI  UA convincing for UTI - WBC 19.8 + Temp <36 = sepsis - appears to be responding to IVF and abx - culture data ha not yet proven to be helpful    Acute renal failure Due to sepsis - renal function has now normalized  Hyponatremia  likely simple due to hypovolemia - improving w/ volume resuscitation    Diabetes mellitus type 1 with hyperglycemia Hypoglycemia has resolved - follow w/o change in tx plan today   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 Wound team consulted - cont hydrotherapy to wounds w/ tan exudate but overall no evidence of significant acute wound infection   Chronic anemia Hgb dropped w/  volume expansion - now s/p 2U PRBC - no evidence of acute blood loss - follow trend  Chronic respiratory failure with trach and vent pulmonary critical care following and managing vent   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 IVC filter placed 8/6  History of spinal canal injury with paraplegia  Code Status: FULL Family Communication: no family present at time of exam today  Disposition Plan: SDU   Consultants: PCCM - vent management   Procedures: none  Antibiotics: Imipenem 10/2 > Vanc 10/2 >  DVT prophylaxis: lovenox   Objective: Blood pressure 95/52, pulse 69, temperature 96.7 F (35.9 C), temperature source Oral, resp. rate 25, height 5\' 6"  (1.676 m), weight 48 kg (105 lb 13.1 oz), SpO2 97 %.  Intake/Output Summary (Last 24 hours) at 07/13/15 0852 Last data filed at 07/13/15 0800  Gross per 24 hour  Intake   4740 ml  Output   1560 ml  Net   3180 ml   Exam: General: No acute respiratory distress - respirations non-labored  Lungs: Clear to auscultation bilaterally without wheezes or crackles Cardiovascular: Regular rate and rhythm without murmur or rub   Abdomen: Nontender, nondistended, soft, bowel sounds positive, no rebound, no ascites, no appreciable mass - PEG insertion clean and dry - ostomy unremarkable  Extremities: No significant cyanosis, clubbing, or edema bilateral lower extremities - muscle wasting B LE  GU:  Sacral and ischial wounds inspected along w/ PT hydrotherapy team   Data Reviewed: Basic Metabolic Panel:  Recent Labs Lab 07/10/15  1633 07/10/15 1646 07/11/15 0440 07/12/15 0310 07/13/15 0321  NA 128* 128* 127* 133* 133*  K 5.1 5.1 4.8 4.9 5.0  CL 104 105 103 112* 113*  CO2 15*  --  14* 14* 14*  GLUCOSE 289* 301* 308* 90 254*  BUN 71* 79* 68* 60* 51*  CREATININE 1.43* 1.50* 1.29* 1.05 0.89  CALCIUM 10.6*  --  9.7 9.1 8.9    CBC:  Recent Labs Lab 07/10/15 1633 07/10/15 1646 07/11/15 0440 07/12/15 0310 07/13/15 0321  WBC 19.8*  --   15.0* 16.4* 20.3*  NEUTROABS 14.0*  --  13.1*  --   --   HGB 8.7* 10.5* 7.9* 7.1* 11.1*  HCT 26.9* 31.0* 25.2* 22.5* 33.8*  MCV 84.3  --  83.4 83.0 84.5  PLT 787*  --  669* 614* 630*    Liver Function Tests:  Recent Labs Lab 07/10/15 1633 07/11/15 0440 07/12/15 0310 07/13/15 0321  AST 13* 20 18 18   ALT 14* 11* 13* 11*  ALKPHOS 139* 139* 127* 146*  BILITOT 0.5 0.3 0.3 0.4  PROT 7.7 7.1 6.5 6.9  ALBUMIN 2.3* 2.0* 1.8* 1.8*   Coags:  Recent Labs Lab 07/11/15 0440  INR 1.18    Recent Labs Lab 07/11/15 0440  APTT 45*   CBG:  Recent Labs Lab 07/12/15 1653 07/12/15 1924 07/12/15 2348 07/13/15 0350 07/13/15 0752  GLUCAP 126* 98 148* 252* 192*    Recent Results (from the past 240 hour(s))  Blood culture (routine x 2)     Status: None (Preliminary result)   Collection Time: 07/10/15  4:25 PM  Result Value Ref Range Status   Specimen Description BLOOD LEFT ARM  Final   Special Requests BOTTLES DRAWN AEROBIC AND ANAEROBIC 5CC  Final   Culture NO GROWTH 2 DAYS  Final   Report Status PENDING  Incomplete  MRSA PCR Screening     Status: None   Collection Time: 07/11/15  1:00 AM  Result Value Ref Range Status   MRSA by PCR NEGATIVE NEGATIVE Final    Comment:        The GeneXpert MRSA Assay (FDA approved for NASAL specimens only), is one component of a comprehensive MRSA colonization surveillance program. It is not intended to diagnose MRSA infection nor to guide or monitor treatment for MRSA infections.   Blood culture (routine x 2)     Status: None (Preliminary result)   Collection Time: 07/11/15  4:19 AM  Result Value Ref Range Status   Specimen Description BLOOD RIGHT FOREARM  Final   Special Requests BOTTLES DRAWN AEROBIC ONLY 2CC  Final   Culture NO GROWTH 1 DAY  Final   Report Status PENDING  Incomplete  Urine culture     Status: None (Preliminary result)   Collection Time: 07/11/15  5:09 PM  Result Value Ref Range Status   Specimen Description  URINE, RANDOM  Final   Special Requests NONE  Final   Culture TOO YOUNG TO READ  Final   Report Status PENDING  Incomplete     Studies:   Recent x-ray studies have been reviewed in detail by the Attending Physician  Scheduled Meds:  Scheduled Meds: . sodium chloride   Intravenous Once  . antiseptic oral rinse  7 mL Mouth Rinse QID  . baclofen  10 mg Per Tube 2 times per day  . baclofen  20 mg Per Tube QHS  . chlorhexidine gluconate  15 mL Mouth Rinse BID  . collagenase   Topical  Daily  . enoxaparin (LOVENOX) injection  30 mg Subcutaneous Daily  . [START ON 07/14/2015] fentaNYL  50 mcg Transdermal Q72H  . imipenem-cilastatin  250 mg Intravenous Q12H  . insulin aspart  0-9 Units Subcutaneous 6 times per day  . insulin glargine  10 Units Subcutaneous QHS  . loratadine  10 mg Per Tube QHS  . midodrine  30 mg Per Tube TID WC  . multivitamin with minerals  1 tablet Per Tube Daily  . pantoprazole sodium  40 mg Per Tube Daily  . pregabalin  100 mg Per Tube 3 times per day  . pregabalin  200 mg Per Tube QHS  . vancomycin  500 mg Intravenous Q24H    Time spent on care of this patient: 35 mins   Kieren Ricci T , MD   [facility] Office  [phone] Pager - Text Page per Shea Evans as per below:  On-Call/Text Page:      [email or web address]      password [email or web address]  If 7PM-7AM, please contact night-coverage [email or web address] Password [email or web address] 07/13/2015, 8:52 AM   LOS: 3 days

## 2015-07-14 DIAGNOSIS — L89314 Pressure ulcer of right buttock, stage 4: Secondary | ICD-10-CM

## 2015-07-14 DIAGNOSIS — B965 Pseudomonas (aeruginosa) (mallei) (pseudomallei) as the cause of diseases classified elsewhere: Secondary | ICD-10-CM

## 2015-07-14 DIAGNOSIS — Z933 Colostomy status: Secondary | ICD-10-CM

## 2015-07-14 DIAGNOSIS — M8668 Other chronic osteomyelitis, other site: Secondary | ICD-10-CM

## 2015-07-14 DIAGNOSIS — R410 Disorientation, unspecified: Secondary | ICD-10-CM

## 2015-07-14 DIAGNOSIS — Z515 Encounter for palliative care: Secondary | ICD-10-CM

## 2015-07-14 DIAGNOSIS — G8220 Paraplegia, unspecified: Secondary | ICD-10-CM

## 2015-07-14 DIAGNOSIS — J9620 Acute and chronic respiratory failure, unspecified whether with hypoxia or hypercapnia: Secondary | ICD-10-CM | POA: Diagnosis present

## 2015-07-14 LAB — CBC
HEMATOCRIT: 35.8 % — AB (ref 39.0–52.0)
HEMOGLOBIN: 11.9 g/dL — AB (ref 13.0–17.0)
MCH: 28.5 pg (ref 26.0–34.0)
MCHC: 33.2 g/dL (ref 30.0–36.0)
MCV: 85.9 fL (ref 78.0–100.0)
Platelets: 666 10*3/uL — ABNORMAL HIGH (ref 150–400)
RBC: 4.17 MIL/uL — ABNORMAL LOW (ref 4.22–5.81)
RDW: 15.5 % (ref 11.5–15.5)
WBC: 30.7 10*3/uL — ABNORMAL HIGH (ref 4.0–10.5)

## 2015-07-14 LAB — GLUCOSE, CAPILLARY
GLUCOSE-CAPILLARY: 290 mg/dL — AB (ref 65–99)
GLUCOSE-CAPILLARY: 202 mg/dL — AB (ref 65–99)
GLUCOSE-CAPILLARY: 240 mg/dL — AB (ref 65–99)
Glucose-Capillary: 197 mg/dL — ABNORMAL HIGH (ref 65–99)
Glucose-Capillary: 205 mg/dL — ABNORMAL HIGH (ref 65–99)
Glucose-Capillary: 245 mg/dL — ABNORMAL HIGH (ref 65–99)

## 2015-07-14 LAB — COMPREHENSIVE METABOLIC PANEL
ALBUMIN: 1.8 g/dL — AB (ref 3.5–5.0)
ALT: 13 U/L — ABNORMAL LOW (ref 17–63)
ANION GAP: 9 (ref 5–15)
AST: 17 U/L (ref 15–41)
Alkaline Phosphatase: 159 U/L — ABNORMAL HIGH (ref 38–126)
BUN: 45 mg/dL — ABNORMAL HIGH (ref 6–20)
CHLORIDE: 108 mmol/L (ref 101–111)
CO2: 16 mmol/L — AB (ref 22–32)
Calcium: 9.1 mg/dL (ref 8.9–10.3)
Creatinine, Ser: 0.77 mg/dL (ref 0.61–1.24)
GFR calc Af Amer: >60 mL/min (ref >60)
GFR calc non Af Amer: >60 mL/min (ref >60)
GLUCOSE: 309 mg/dL — AB (ref 65–99)
POTASSIUM: 4.4 mmol/L (ref 3.5–5.1)
SODIUM: 133 mmol/L — AB (ref 135–145)
Total Bilirubin: 0.4 mg/dL (ref 0.3–1.2)
Total Protein: 6.7 g/dL (ref 6.5–8.1)

## 2015-07-14 LAB — TYPE AND SCREEN
ABO/RH(D): O POS
Antibody Screen: NEGATIVE
Unit division: 0
Unit division: 0

## 2015-07-14 MED ORDER — FOSFOMYCIN TROMETHAMINE 3 G PO PACK
3.0000 g | PACK | Freq: Once | ORAL | Status: AC
  Administered 2015-07-14: 3 g via ORAL
  Filled 2015-07-14 (×2): qty 3

## 2015-07-14 MED ORDER — INSULIN GLARGINE 100 UNIT/ML ~~LOC~~ SOLN
18.0000 [IU] | Freq: Every day | SUBCUTANEOUS | Status: DC
  Administered 2015-07-14: 18 [IU] via SUBCUTANEOUS
  Filled 2015-07-14 (×2): qty 0.18

## 2015-07-14 MED ORDER — INSULIN ASPART 100 UNIT/ML ~~LOC~~ SOLN
0.0000 [IU] | SUBCUTANEOUS | Status: DC
  Administered 2015-07-14 (×2): 5 [IU] via SUBCUTANEOUS
  Administered 2015-07-15 (×2): 3 [IU] via SUBCUTANEOUS
  Administered 2015-07-15: 5 [IU] via SUBCUTANEOUS

## 2015-07-14 MED ORDER — INFLUENZA VAC SPLIT QUAD 0.5 ML IM SUSY
0.5000 mL | PREFILLED_SYRINGE | INTRAMUSCULAR | Status: DC
  Filled 2015-07-14 (×2): qty 0.5

## 2015-07-14 NOTE — Progress Notes (Signed)
Full note to follow:  I have met with Jason [name] today. He is very content and finds enjoyment in his day to day life. He has no complaints or concerns. He expresses desire to continue aggressive care and maintain full code. He does wish for swallow study with hopes to at least to have some ice chips. Discussed with Dr. Woods.    , NP Palliative Medicine Team Pager # [phone] (M-F 8a-5p) Team Phone # [phone] (Nights/Weekends) 

## 2015-07-14 NOTE — Progress Notes (Signed)
[facility] TEAM 1 - Stepdown/ICU TEAM Progress Note  [name] YQM:[id number] DOB: [birth year] DOA: 07/10/2015 PCP: [name], MD  Admit HPI / Brief Narrative: Jason [name] is a 29 y.o. BM PMHx Spinal cord injury status post paraplegia who was recently admitted for MRSA and microaerophilic strep bacteremia with respiratory failure requiring trach and is the patient is on vent in the nights was brought to the ER after patient was found to have a hallucination and confusion as per patient's mother. Patient was recently tapered off his IV antibiotics and was placed on doxycycline and Cipro was started 3 days ago for possible UTI.   Over the last 3 days patient was noticed to have increasing confusion and hallucination as per patient's mother. In the ER patient UA shows features concerning for UTI and patient has chronic suprapubic catheter. Patient is also found to be hypothermic and was also hypotensive which improved with fluid bolus. Patient has had previous history of multiple debridement for chronic sacral decubitus which at this time looks healing. Patient also has history of chronic osteomyelitis. Patient has been admitted for possible developing sepsis. On call pulmonary critical care has been consulted and patient has been admitted for further management.  HPI/Subjective: 10/6 A/O 4, cognition significantly improved over 10/4. Negative CP, negative SOB. Patient only wants to have ice chips.  Assessment/Plan: Toxic metabolic encephalopathy -Mental status improved   Sepsis due to UTI (cause/exacerbated by chronic indwelling cath)  -UA convincing for UTI - WBC 19.8 + Temp <36 = sepsis - appears to be responding to IVF and abx  -Complete 5 day course of antibiotics - Urine and blood cultures pending    Acute renal failure -Due to sepsis - improving w/ volume expansion   Hyponatremia  likely simple due to hypovolemia  -Improving with hydration   Diabetes mellitus type  1 with hyperglycemia -Well controlled - Increase Lantus 18 units daily -Increase to moderate SSI  Sacral decubitus/bilateral ischeal ulcers and left chest wall decubitus -Wound team consulted -Hydrotherapy daily -After a few days of hydrotherapy will consult for wound Vac -Triadyne Bed maintain in rotational mode  Chronic anemia -Worsening secondary to hemodilution  -Transfuse for hemoglobin <7  -10/4 Transfuse 2 units PRBC   Chronic respiratory failure with trach and vent -pulmonary critical care following and managing vent  -PCXR in a.m.  History of spinal cord injury with paraplegia  Goals of care -Palliative care consult placed. Patient with spinal cord injury resulting in paraplegia, indwelling cath with multiple infections, nonhealing multiple decubitus ulcers to sacrum and bilateral buttocks resulting in frequent hospitalization; short-term vs long-term goals of care   Code Status: FULL Family Communication: Caregiver present at time of exam Disposition Plan: SNF? Will await recommendations from palliative care    Consultants: PCCM - vent management   Procedure/Significant Events:    Culture 10/2 blood left on NGTD 10/3 blood right forearm NGTD 10/3 urine pending 10/3 MRSA by PCR negative  Antibiotics: Imipenem 10/2 > Vanc 10/2 >  DVT prophylaxis: lovenox    Devices    LINES / TUBES:      Continuous Infusions: . sodium chloride 50 mL/hr at 07/14/15 0430  . feeding supplement (VITAL AF 1.2 CAL) 1,000 mL (07/14/15 1546)    Objective: VITAL SIGNS: Temp: 97.7 F (36.5 C) (10/06 2000) Temp Source: Oral (10/06 2000) BP: 133/94 mmHg (10/06 2000) Pulse Rate: 70 (10/06 2000) SPO2; FIO2:   Intake/Output Summary (Last 24 hours) at 07/14/15 2025 Last data filed  at 07/14/15 1800  Gross per 24 hour  Intake   1825 ml  Output   1400 ml  Net    425 ml     Exam: General: A/O 4, NAD, No acute respiratory distress Eyes: Negative headache,  eye pain, double vision,negative scleral hemorrhage ENT: Negative Runny nose, negative ear pain, negative gingival bleeding, Neck:  Negative scars, masses, torticollis, lymphadenopathy, JVD Lungs: Clear to auscultation bilaterally without wheezes or crackles Cardiovascular: Regular rate and rhythm without murmur gallop or rub normal S1 and S2 Abdomen:negative abdominal pain, nondistended, positive soft, bowel sounds, no rebound, no ascites, no appreciable mass, suprapubic catheter in place negative discharge or sign of infection Extremities: atrophy, large stage 4 sacral decubitus ulcer, bilateral ischeal stage IV decubitus ulcers, unstageable left heel and toe ulcer Psychiatric:  Negative depression, negative anxiety, negative fatigue, negative mania  Neurologic:  Cranial nerves II through XII intact, tongue/uvula midline, bilateral upper extremity gross muscles strength 2/5, bilateral lower extremity paralysis negative dysarthria, negative expressive aphasia, negative receptive aphasia.   Data Reviewed: Basic Metabolic Panel:  Recent Labs Lab 07/10/15 1633 07/10/15 1646 07/11/15 0440 07/12/15 0310 07/13/15 0321 07/14/15 0346  NA 128* 128* 127* 133* 133* 133*  K 5.1 5.1 4.8 4.9 5.0 4.4  CL 104 105 103 112* 113* 108  CO2 15*  --  14* 14* 14* 16*  GLUCOSE 289* 301* 308* 90 254* 309*  BUN 71* 79* 68* 60* 51* 45*  CREATININE 1.43* 1.50* 1.29* 1.05 0.89 0.77  CALCIUM 10.6*  --  9.7 9.1 8.9 9.1   Liver Function Tests:  Recent Labs Lab 07/10/15 1633 07/11/15 0440 07/12/15 0310 07/13/15 0321 07/14/15 0346  AST 13* 20 18 18 17   ALT 14* 11* 13* 11* 13*  ALKPHOS 139* 139* 127* 146* 159*  BILITOT 0.5 0.3 0.3 0.4 0.4  PROT 7.7 7.1 6.5 6.9 6.7  ALBUMIN 2.3* 2.0* 1.8* 1.8* 1.8*   No results for input(s): LIPASE, AMYLASE in the last 168 hours. No results for input(s): AMMONIA in the last 168 hours. CBC:  Recent Labs Lab 07/10/15 1633 07/10/15 1646 07/11/15 0440 07/12/15 0310  07/13/15 0321 07/14/15 0346  WBC 19.8*  --  15.0* 16.4* 20.3* 30.7*  NEUTROABS 14.0*  --  13.1*  --   --   --   HGB 8.7* 10.5* 7.9* 7.1* 11.1* 11.9*  HCT 26.9* 31.0* 25.2* 22.5* 33.8* 35.8*  MCV 84.3  --  83.4 83.0 84.5 85.9  PLT 787*  --  669* 614* 630* 666*   Cardiac Enzymes: No results for input(s): CKTOTAL, CKMB, CKMBINDEX, TROPONINI in the last 168 hours. BNP (last 3 results) No results for input(s): BNP in the last 8760 hours.  ProBNP (last 3 results) No results for input(s): PROBNP in the last 8760 hours.  CBG:  Recent Labs Lab 07/14/15 0037 07/14/15 0428 07/14/15 0728 07/14/15 1225 07/14/15 1555  GLUCAP 245* 290* 197* 205* 202*    Recent Results (from the past 240 hour(s))  Blood culture (routine x 2)     Status: None (Preliminary result)   Collection Time: 07/10/15  4:25 PM  Result Value Ref Range Status   Specimen Description BLOOD LEFT ARM  Final   Special Requests BOTTLES DRAWN AEROBIC AND ANAEROBIC 5CC  Final   Culture NO GROWTH 4 DAYS  Final   Report Status PENDING  Incomplete  MRSA PCR Screening     Status: None   Collection Time: 07/11/15  1:00 AM  Result Value Ref Range Status  MRSA by PCR NEGATIVE NEGATIVE Final    Comment:        The GeneXpert MRSA Assay (FDA approved for NASAL specimens only), is one component of a comprehensive MRSA colonization surveillance program. It is not intended to diagnose MRSA infection nor to guide or monitor treatment for MRSA infections.   Blood culture (routine x 2)     Status: None (Preliminary result)   Collection Time: 07/11/15  4:19 AM  Result Value Ref Range Status   Specimen Description BLOOD RIGHT FOREARM  Final   Special Requests BOTTLES DRAWN AEROBIC ONLY 2CC  Final   Culture NO GROWTH 3 DAYS  Final   Report Status PENDING  Incomplete  Urine culture     Status: None (Preliminary result)   Collection Time: 07/11/15  5:09 PM  Result Value Ref Range Status   Specimen Description URINE, RANDOM   Final   Special Requests NONE  Final   Culture   Final    >=100,000 COLONIES/mL PSEUDOMONAS AERUGINOSA >=100,000 COLONIES/mL YEAST    Report Status PENDING  Incomplete     Studies:  Recent x-ray studies have been reviewed in detail by the Attending Physician  Scheduled Meds:  Scheduled Meds: . antiseptic oral rinse  7 mL Mouth Rinse QID  . baclofen  10 mg Per Tube 2 times per day  . baclofen  20 mg Per Tube QHS  . chlorhexidine gluconate  15 mL Mouth Rinse BID  . collagenase   Topical Daily  . enoxaparin (LOVENOX) injection  30 mg Subcutaneous Daily  . fentaNYL  50 mcg Transdermal Q72H  . imipenem-cilastatin  250 mg Intravenous 3 times per day  . [START ON 07/15/2015] Influenza vac split quadrivalent PF  0.5 mL Intramuscular Tomorrow-1000  . insulin aspart  0-15 Units Subcutaneous 6 times per day  . insulin glargine  18 Units Subcutaneous QHS  . midodrine  30 mg Per Tube TID WC  . multivitamin with minerals  1 tablet Per Tube Daily  . pantoprazole sodium  40 mg Per Tube Daily  . pregabalin  100 mg Per Tube 3 times per day  . pregabalin  200 mg Per Tube QHS  . vancomycin  500 mg Intravenous Q24H    Time spent on care of this patient: 40 mins   [place]ia Simerson, Geraldo Docker , MD  [facility] Office  [phone] Pager [phone]  On-Call/Text Page:      [email or web address]      password [email or web address]  If 7PM-7AM, please contact night-coverage [email or web address] Password [email or web address] 07/14/2015, 8:25 PM   LOS: 4 days   Care during the described time interval was provided by me .  I have reviewed this patient's available data, including medical history, events of note, physical examination, and all test results as part of my evaluation. I have personally reviewed and interpreted all radiology studies.   Dia Crawford, MD [phone] Pager

## 2015-07-14 NOTE — Consult Note (Signed)
[facility] for Infectious Disease  Total days of antibiotics 5        Vancomycin 10/2>>        Impenem-Cilastatin 10/3-10/5               Reason for Consult: Complicated UTI with MDR-Pseudomonas     Referring Physician: [facility]   Principal Problem:   Acute encephalopathy Active Problems:   Decubitus ulcer of ischial area   Chronic anemia   Uncontrolled type 1 diabetes mellitus with hyperglycemia ([place])   Altered mental status   Decubitus ulcer of sacral area   Tracheostomy dependent ([place])   H/O spinal cord injury   Chronic paraplegia ([name])    HPI: Jason [name] is a 29yo man with PMHx of type 1 DM with gastoparesis and peripheral neuropathy, 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 He was discharged on IV Vanc to start 8/25 and be completed through 9/18 for a total of 6 weeks of therapy. He presented to the ED on 10/2 after he was found to have confusion and hallucinations. He had been seen in the ID clinic on 9/27 and was prescribed Doxycycline in case of residual osteomyelitis. He was also prescribed Ciprofloxacin on 9/30 for UTI symptoms. Patient's acute encephalopathy was attributed to likely sepsis and he was placed on empiric vancomycin and imipenem-cilastatin. His UA on admission showed TNTC WBCs, TNTC RBCs, many yeast, and large leukocytes. His urine culture grew MDR Pseudomonas aeruginosa. All blood cultures have been negative to date. His mental status improved significantly by 10/5 after IVF and antibiotic treatment. However, he continues to be hypothermic, hypotensive (currently on midodrine), and his leukocytosis continues to worsen. WBC count 19.8 on admission and improved initially down to 15.0 but then  trended up again to 16.4>20.3>30.7.   Patient is alert and oriented. His encephalopathy seems to have resolved. Patient denies any fevers, chills, dysuria, hematuria, frequency, abdominal pain, nausea, or vomiting.   Past Medical History  Diagnosis Date  . GERD (gastroesophageal reflux disease)   . Asthma   . Hx MRSA infection     on face  . Gastroparesis   . Diabetic neuropathy ([place])   . Seizures ([place])   . Family history of anesthesia complication     Pt mother can't have epidural procedures  . Dysrhythmia   . Pneumonia   . Arthritis   . Fibromyalgia   . Stroke ([place]) 01/29/2014    spinal stroke ; "quadriplegic since"  . Type I diabetes mellitus ([place])     sees Dr. Loanne Drilling   . Syncope 02/16/2015    Allergies:  Allergies  Allergen Reactions  . Cefuroxime Axetil Anaphylaxis  . Ertapenem Other (See Comments)    Rash and confusion-->tolerated Imipenem   . Morphine And Related Other (See Comments)    Changed mental status, confusion, headache, visual hallucination  . Penicillins Anaphylaxis and Other (See Comments)    Tolerated Imipenem; no reaction to 7 day course of amoxicillin in 2015 Has patient had a PCN reaction causing immediate rash, facial/tongue/throat swelling, SOB or lightheadedness with hypotension: Yes Has patient had a PCN reaction causing severe rash involving mucus membranes or skin necrosis: No Has patient had a PCN reaction that required hospitalization Yes Has patient had a PCN reaction occurring within the last 10 years: No If all  of the above answers are "NO", then may proceed with Cephalo  . Sulfa Antibiotics Anaphylaxis, Shortness Of Breath and Other (See Comments)  . Tessalon [Benzonatate] Anaphylaxis  . Shellfish Allergy Itching and Other (See Comments)    Took benadryl to alleviate reaction  . Nsaids Other (See Comments)    Risk of bleeding  . Miripirium Rash and Other (See Comments)    Change in mental status    Current  antibiotics:   MEDICATIONS: . antiseptic oral rinse  7 mL Mouth Rinse QID  . baclofen  10 mg Per Tube 2 times per day  . baclofen  20 mg Per Tube QHS  . chlorhexidine gluconate  15 mL Mouth Rinse BID  . collagenase   Topical Daily  . enoxaparin (LOVENOX) injection  30 mg Subcutaneous Daily  . fentaNYL  50 mcg Transdermal Q72H  . imipenem-cilastatin  250 mg Intravenous 3 times per day  . [START ON 07/15/2015] Influenza vac split quadrivalent PF  0.5 mL Intramuscular Tomorrow-1000  . insulin aspart  0-9 Units Subcutaneous 6 times per day  . insulin glargine  10 Units Subcutaneous QHS  . midodrine  30 mg Per Tube TID WC  . multivitamin with minerals  1 tablet Per Tube Daily  . pantoprazole sodium  40 mg Per Tube Daily  . pregabalin  100 mg Per Tube 3 times per day  . pregabalin  200 mg Per Tube QHS  . vancomycin  500 mg Intravenous Q24H    Social History  Substance Use Topics  . Smoking status: Former Smoker -- 1.00 packs/day for 12 years    Types: Cigars, Cigarettes    Quit date: 09/07/2014  . Smokeless tobacco: Never Used  . Alcohol Use: No    Family History  Problem Relation Age of Onset  . Diabetes Father   . Hypertension Father   . Asthma      fhx  . Hypertension      fhx  . Stroke      fhx  . Heart disease Mother     Review of Systems -  General: Denies night sweats, changes in weight, changes in appetite HEENT: Denies headaches, ear pain, changes in vision, rhinorrhea, sore throat CV: Denies CP, palpitations, SOB, orthopnea Pulm: Denies SOB, cough, wheezing GI: Denies diarrhea, constipation, melena, hematochezia GU: See HPI Msk: Denies muscle cramps, joint pains Neuro: Reports weakness- chronic, paraplegic.  Skin: Denies rashes, bruising Psych: Denies depression, anxiety, hallucinations   OBJECTIVE: Temp:  [97.3 F (36.3 C)-98.3 F (36.8 C)] 98.3 F (36.8 C) (10/06 0729) Pulse Rate:  [68-77] 71 (10/06 0729) Resp:  [9-20] 14 (10/06 0729) BP:  (98-130)/(58-100) 98/78 mmHg (10/06 0729) SpO2:  [97 %-100 %] 100 % (10/06 0929) FiO2 (%):  [28 %-40 %] 28 % (10/06 0929) Weight:  [106 lb 14.8 oz (48.5 kg)-108 lb 11 oz (49.3 kg)] 106 lb 14.8 oz (48.5 kg) (10/06 0431) General: alert, sitting up in bed, NAD HEENT: [place]/AT, EOMI, sclera anicteric, mucus membranes dry CV: RRR, no m/g/r Pulm: CTA bilaterally, breaths non-labored  Abd: BS+, soft, non-tender, non-distended. PEG tube on left side of abdomen, appears clean/dry/intact, no erythema. Suprapubic catheter present- no erythema. Left-sided colostomy with small amount of stool.  Ext: no peripheral edema, muscle wasting in bilateral lower extremities  Skin: multiple skin abrasions on lower extremities- chronic appearing Neuro: alert and oriented x 3, strength 3/5 in upper extremities, unable to move lower extremities  LABS: Results for orders placed or performed during the  hospital encounter of 07/10/15 (from the past 48 hour(s))  Glucose, capillary     Status: Abnormal   Collection Time: 07/12/15 12:13 PM  Result Value Ref Range   Glucose-Capillary 139 (H) 65 - 99 mg/dL  Glucose, capillary     Status: Abnormal   Collection Time: 07/12/15  4:53 PM  Result Value Ref Range   Glucose-Capillary 126 (H) 65 - 99 mg/dL  Type and screen     Status: None   Collection Time: 07/12/15  6:50 PM  Result Value Ref Range   ABO/RH(D) O POS    Antibody Screen NEG    Sample Expiration 07/15/2015    Unit Number [id number]    Blood Component Type RBC LR PHER2    Unit division 00    Status of Unit ISSUED,FINAL    Transfusion Status OK TO TRANSFUSE    Crossmatch Result Compatible    Unit Number [id number]    Blood Component Type RBC LR PHER1    Unit division 00    Status of Unit ISSUED,FINAL    Transfusion Status OK TO TRANSFUSE    Crossmatch Result Compatible   Glucose, capillary     Status: None   Collection Time: 07/12/15  7:24 PM  Result Value Ref Range   Glucose-Capillary 98 65 - 99  mg/dL   Comment 1 Capillary Specimen    Comment 2 Notify RN    Comment 3 Document in Chart   Prepare RBC     Status: None   Collection Time: 07/12/15  9:02 PM  Result Value Ref Range   Order Confirmation ORDER PROCESSED BY BLOOD BANK   Glucose, capillary     Status: Abnormal   Collection Time: 07/12/15 11:48 PM  Result Value Ref Range   Glucose-Capillary 148 (H) 65 - 99 mg/dL   Comment 1 Capillary Specimen    Comment 2 Notify RN    Comment 3 Document in Chart   CBC     Status: Abnormal   Collection Time: 07/13/15  3:21 AM  Result Value Ref Range   WBC 20.3 (H) 4.0 - 10.5 K/uL   RBC 4.00 (L) 4.22 - 5.81 MIL/uL   Hemoglobin 11.1 (L) 13.0 - 17.0 g/dL    Comment: DELTA CHECK NOTED REPEATED TO VERIFY POST TRANSFUSION SPECIMEN    HCT 33.8 (L) 39.0 - 52.0 %   MCV 84.5 78.0 - 100.0 fL   MCH 27.8 26.0 - 34.0 pg   MCHC 32.8 30.0 - 36.0 g/dL   RDW 14.7 11.5 - 15.5 %   Platelets 630 (H) 150 - 400 K/uL  Comprehensive metabolic panel     Status: Abnormal   Collection Time: 07/13/15  3:21 AM  Result Value Ref Range   Sodium 133 (L) 135 - 145 mmol/L   Potassium 5.0 3.5 - 5.1 mmol/L   Chloride 113 (H) 101 - 111 mmol/L   CO2 14 (L) 22 - 32 mmol/L   Glucose, Bld 254 (H) 65 - 99 mg/dL   BUN 51 (H) 6 - 20 mg/dL   Creatinine, Ser 0.89 0.61 - 1.24 mg/dL   Calcium 8.9 8.9 - 10.3 mg/dL   Total Protein 6.9 6.5 - 8.1 g/dL   Albumin 1.8 (L) 3.5 - 5.0 g/dL   AST 18 15 - 41 U/L   ALT 11 (L) 17 - 63 U/L   Alkaline Phosphatase 146 (H) 38 - 126 U/L   Total Bilirubin 0.4 0.3 - 1.2 mg/dL   GFR calc non Af Amer >60 >  60 mL/min   GFR calc Af Amer >60 >60 mL/min    Comment: (NOTE) The eGFR has been calculated using the CKD EPI equation. This calculation has not been validated in all clinical situations. eGFR's persistently <60 mL/min signify possible Chronic Kidney Disease.    Anion gap 6 5 - 15  Glucose, capillary     Status: Abnormal   Collection Time: 07/13/15  3:50 AM  Result Value Ref Range    Glucose-Capillary 252 (H) 65 - 99 mg/dL   Comment 1 Capillary Specimen    Comment 2 Notify RN    Comment 3 Document in Chart   Glucose, capillary     Status: Abnormal   Collection Time: 07/13/15  7:52 AM  Result Value Ref Range   Glucose-Capillary 192 (H) 65 - 99 mg/dL   Comment 1 Capillary Specimen    Comment 2 Notify RN   Glucose, capillary     Status: Abnormal   Collection Time: 07/13/15 12:29 PM  Result Value Ref Range   Glucose-Capillary 269 (H) 65 - 99 mg/dL   Comment 1 Capillary Specimen    Comment 2 Notify RN   Glucose, capillary     Status: Abnormal   Collection Time: 07/13/15  4:16 PM  Result Value Ref Range   Glucose-Capillary 259 (H) 65 - 99 mg/dL  Glucose, capillary     Status: Abnormal   Collection Time: 07/13/15  8:47 PM  Result Value Ref Range   Glucose-Capillary 235 (H) 65 - 99 mg/dL  Glucose, capillary     Status: Abnormal   Collection Time: 07/14/15 12:37 AM  Result Value Ref Range   Glucose-Capillary 245 (H) 65 - 99 mg/dL  Comprehensive metabolic panel     Status: Abnormal   Collection Time: 07/14/15  3:46 AM  Result Value Ref Range   Sodium 133 (L) 135 - 145 mmol/L   Potassium 4.4 3.5 - 5.1 mmol/L   Chloride 108 101 - 111 mmol/L   CO2 16 (L) 22 - 32 mmol/L   Glucose, Bld 309 (H) 65 - 99 mg/dL   BUN 45 (H) 6 - 20 mg/dL   Creatinine, Ser 0.77 0.61 - 1.24 mg/dL   Calcium 9.1 8.9 - 10.3 mg/dL   Total Protein 6.7 6.5 - 8.1 g/dL   Albumin 1.8 (L) 3.5 - 5.0 g/dL   AST 17 15 - 41 U/L   ALT 13 (L) 17 - 63 U/L   Alkaline Phosphatase 159 (H) 38 - 126 U/L   Total Bilirubin 0.4 0.3 - 1.2 mg/dL   GFR calc non Af Amer >60 >60 mL/min   GFR calc Af Amer >60 >60 mL/min    Comment: (NOTE) The eGFR has been calculated using the CKD EPI equation. This calculation has not been validated in all clinical situations. eGFR's persistently <60 mL/min signify possible Chronic Kidney Disease.    Anion gap 9 5 - 15  CBC     Status: Abnormal   Collection Time: 07/14/15   3:46 AM  Result Value Ref Range   WBC 30.7 (H) 4.0 - 10.5 K/uL   RBC 4.17 (L) 4.22 - 5.81 MIL/uL   Hemoglobin 11.9 (L) 13.0 - 17.0 g/dL   HCT 35.8 (L) 39.0 - 52.0 %   MCV 85.9 78.0 - 100.0 fL   MCH 28.5 26.0 - 34.0 pg   MCHC 33.2 30.0 - 36.0 g/dL   RDW 15.5 11.5 - 15.5 %   Platelets 666 (H) 150 - 400 K/uL  Glucose, capillary  Status: Abnormal   Collection Time: 07/14/15  4:28 AM  Result Value Ref Range   Glucose-Capillary 290 (H) 65 - 99 mg/dL  Glucose, capillary     Status: Abnormal   Collection Time: 07/14/15  7:28 AM  Result Value Ref Range   Glucose-Capillary 197 (H) 65 - 99 mg/dL    MICRO:  IMAGING: Dg Chest Port 1 View  07/13/2015   CLINICAL DATA:  Follow-up of respiratory failure  EXAM: PORTABLE CHEST 1 VIEW  COMPARISON:  Portable chest x-ray of July 10, 2015  FINDINGS: The lungs are adequately inflated. The interstitial markings are slightly more conspicuous today especially in the infrahilar regions bilaterally greater on the left than on the right. There is no pleural effusion or pneumothorax. The heart and pulmonary vascularity are normal. The tracheostomy appliance tip projects just inferior to the inferior margins of the clavicular heads.  IMPRESSION: Interval development of subsegmental atelectasis in the infrahilar regions bilaterally. There is no alveolar infiltrate nor evidence of pulmonary edema.   Electronically Signed   By: David  Martinique M.D.   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   Complicated UTI with MDR Pseudomonas aeroginosa: Patient with multiple comorbidities and chronic suprapubic catheter placing him at high risk for recurrent infections. He has not had a Pseudomonas UTI before from what I can see in EPIC. Since has multiple medication allergies with anaphylaxis as side effect (including cephalosporins) are limited in antibiotics. Will place on Fosfomycin for now and ask microbiology lab if can determine if his Pseudomonas is sensitive to Aztreonam or Amikacin. Recommend to replace suprapubic catheter as well if not already done.   Chronic Pelvic Osteomyelitis: Will continue broad spectrum coverage with vancomycin and imipenem. Will try to coordinate with wound care so we are able to visualize his sacral/pelvic decubitus ulcers.   Agree with Palliative care consult to discuss Pope as this patient has multiple severe comorbidities and frequent hospitalizations.   Thank you for this interesting consult. Attending note to follow.  Albin Felling, MD, MPH Internal Medicine Resident, PGY-II Pager: [phone]

## 2015-07-14 NOTE — Trach Care Team (Addendum)
[facility] Progression Note   Patient Details Name: Jason [name] MRN: [id number] DOB: [birth year] Today's Date: 07/14/2015   Tracheostomy Assessment    Tracheostomy Shiley 6 mm Cuffed (Active)  Status Secured;Passy Muir Speaking valve 07/14/2015 11:13 AM  Site Assessment Clean;Dry 07/14/2015 11:13 AM  Site Care Cleansed;Dried;Dressing applied 07/13/2015  3:34 PM  Inner Cannula Care Changed/new 07/13/2015  3:34 PM  Ties Assessment Secure;Clean;Dry 07/14/2015 11:13 AM  Cuff pressure (cm) 0 cm 07/14/2015 11:13 AM  Emergency Equipment at bedside Yes 07/14/2015 11:13 AM        Respiratory Therapy Tracheostomy: Chronic trach O2 Device: Tracheostomy Collar FiO2 (%): 28 % SpO2: 100 % Education: Ongoing Follow up recommendations:  (will follow for progression)    Speech Language Pathology  SLP chart review complete: Request order for swallow evaluation, Request order for PMSV evaluation, Patient ready for SLP services (Pt being seen by SLP last admit, P.Babcock said needs MBS at last clinica visit)        Nutritional Patient's Current Diet: Tube feeding Tube Feeding: Vital High Protein Tube Feeding Frequency: Continuous Tube Feeding Strength: Full strength    Case Management/Social Work  in progress     [employer], [place] CCC-SLP [phone]   Lynann Beaver 07/14/2015, 2:08 PM

## 2015-07-14 NOTE — Consult Note (Signed)
Consultation Note Date: 07/14/2015   Patient Name: Jason [name]  DOB: 05-31-1986  MRN: [id number]  Age / Sex: 29 y.o., male   PCP: [name], MD Referring Physician: Allie Bossier, MD  Reason for Consultation: Establishing goals of care  Palliative Care Assessment and Plan Summary of Established Goals of Care and Medical Treatment Preferences    Palliative Care Discussion Held Today:   I have met with [facility] today. We briefly discussed his history and recurrent admissions and how difficult this must be for him. I asked him about his QOL and he tells me that he feels this is good. He is very content and finds enjoyment in his day to day life. He says he enjoys playing games like [place] on his phone, watching sports, and enjoys sitting and talking with his mother. He likes music and television. He does not express any aspects of life that he feels he is missing out on and says he is happy.   He has no complaints or concerns. No pain or discomfort, denies sleep problems, depression, or any distress. His one desire is to have some ice chips and I see that SLP is following to work with him so I refer to them - he understands. He does expresse desire to continue aggressive care and maintain full code. Support provided. Discussed with Dr. Sherral Hammers.    Contacts/Participants in Discussion: Primary Decision Maker: Self and then parents   Goals of Care/Code Status/Advance Care Planning:   Code Status: FULL  Continue aggressive care.    Symptom Management:   No complaints/symptoms.  Continue hydrotherapy for wounds per primary team.  Psycho-social/Spiritual:   Support System: Family is supportive and cares for him at home.    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 Recurrent  infection with severe sacral decubitus, bilateral ischeal ulcers, and left chest wall decubitus. He was admitted with confusion r/t urosepsis.   Primary Diagnoses  Present on Admission:  . Acute encephalopathy . Decubitus ulcer of ischial area . Chronic anemia . Uncontrolled type 1 diabetes mellitus with hyperglycemia ([place]) . Altered mental status . Decubitus ulcer of sacral area . Tracheostomy dependent ([place]) . H/O spinal cord injury . Chronic paraplegia ([name])  Palliative Review of Systems:   Denies pain, discomfort.    I have reviewed the medical record, interviewed the patient and family, and examined the patient. The following aspects are pertinent.  Past Medical History  Diagnosis Date  . GERD (gastroesophageal reflux disease)   . Asthma   . Hx MRSA infection     on face  . Gastroparesis   . Diabetic neuropathy ([place])   . Seizures ([place])   . Family history of anesthesia complication     Pt mother can't have epidural procedures  . Dysrhythmia   . Pneumonia   . Arthritis   . Fibromyalgia   . 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 Type I diabetes mellitus ([place])     sees Dr. Loanne Drilling   . Syncope 02/16/2015   Social History   Social History  . Marital Status: Single    Spouse Name: N/A  . Number of Children: 0  . Years of Education: 11   Occupational History  . Disability    Social History Main Topics  .  Smoking status: Former Smoker -- 1.00 packs/day for 12 years    Types: Cigars, Cigarettes    Quit date: 09/07/2014  . Smokeless tobacco: Never Used  . Alcohol Use: No  . Drug Use: No  . Sexual Activity: Not Currently   Other Topics Concern  . None   Social History Narrative   Patient lives at home with mother father.    Patient has 2 children.    Patient is single.    Patient was left hand but after stroke patient is now right handed.    Patient has  11th grade education.    Family History  Problem Relation Age of Onset    . Diabetes Father   . Hypertension Father   . Asthma      fhx  . Hypertension      fhx  . Stroke      fhx  . Heart disease Mother    Scheduled Meds: . antiseptic oral rinse  7 mL Mouth Rinse QID  . baclofen  10 mg Per Tube 2 times per day  . baclofen  20 mg Per Tube QHS  . chlorhexidine gluconate  15 mL Mouth Rinse BID  . collagenase   Topical Daily  . enoxaparin (LOVENOX) injection  30 mg Subcutaneous Daily  . fentaNYL  50 mcg Transdermal Q72H  . imipenem-cilastatin  250 mg Intravenous 3 times per day  . [START ON 07/15/2015] Influenza vac split quadrivalent PF  0.5 mL Intramuscular Tomorrow-1000  . insulin aspart  0-15 Units Subcutaneous 6 times per day  . insulin glargine  18 Units Subcutaneous QHS  . midodrine  30 mg Per Tube TID WC  . multivitamin with minerals  1 tablet Per Tube Daily  . pantoprazole sodium  40 mg Per Tube Daily  . pregabalin  100 mg Per Tube 3 times per day  . pregabalin  200 mg Per Tube QHS  . vancomycin  500 mg Intravenous Q24H   Continuous Infusions: . sodium chloride 50 mL/hr at 07/14/15 0430  . feeding supplement (VITAL AF 1.2 CAL) 1,000 mL (07/14/15 1546)   PRN Meds:.acetaminophen **OR** acetaminophen, albuterol, haloperidol lactate, LORazepam, mirtazapine, ondansetron **OR** ondansetron (ZOFRAN) IV, oxyCODONE Medications Prior to Admission:  Prior to Admission medications   Medication Sig Start Date End Date Taking? Authorizing Provider  acetaminophen (TYLENOL) 325 MG tablet Take 2 tablets (650 mg total) by mouth every 6 (six) hours as needed for mild pain, moderate pain, fever or headache. 01/14/15  Yes Geradine Girt, DO  albuterol (PROVENTIL) (2.5 MG/3ML) 0.083% nebulizer solution Take 3 mLs (2.5 mg total) by nebulization every 4 (four) hours as needed for wheezing or shortness of breath. 04/27/14  Yes [name], MD  baclofen (LIORESAL) 10 MG tablet Take 1-2 tablets (10-20 mg total) by mouth 3 (three) times daily. Takes 56m in morning and  lunchtime and takes 263mat bedtime Patient taking differently: 10-20 mg by PEG Tube route 3 (three) times daily. Take 1 tablet (10 mg) by mouth at 9am and 6pm, take 2 tablets (20 mg) daily at bedtime - 10pm 06/15/15  Yes ZaMeredith StaggersMD  collagenase (SANTYL) ointment Apply 1 application topically daily. Apply to back and foot wound every M/W/F Patient taking differently: Apply 1 application topically every Monday, Wednesday, and Friday. Apply topically to foot wound 06/01/15  Yes PeErick ColaceNP  doxycycline (VIBRA-TABS) 100 MG tablet Take 1 tablet (100 mg total) by mouth 2 (two) times daily. Patient taking differently:  Take 100 mg by mouth 2 (two) times daily. 3 month course started approximately 07/01/15 07/05/15  Yes Truman Hayward, MD  Emollient (COLLAGEN EX) Apply 1 application topically daily. Apply daily to sacral/ischial wound, pack with dry kerlix, cover with abd gauze   Yes Historical Provider, MD  fentaNYL (DURAGESIC - DOSED MCG/HR) 50 MCG/HR Place 1 patch (50 mcg total) onto the skin every 3 (three) days. 06/29/15  Yes Meredith Staggers, MD  glucagon (GLUCAGON EMERGENCY) 1 MG injection INJECT INTO MUSCLE ONCE AS NEEDED Patient taking differently: Inject 1 mg into the muscle once as needed (low blood sugar).  07/05/15  Yes Renato Shin, MD  [facility]) LIQD by PEG Tube route continuous. 70 ml/hr   Yes Historical Provider, MD  insulin aspart (NOVOLOG FLEXPEN) 100 UNIT/ML FlexPen Inject 0-10 Units into the skin 4 (four) times daily as needed for high blood sugar (CBG >200). Per sliding scale: CBG <200 0 units, 201-300 4 units, 301-400 6 units, >400 10 units   Yes Historical Provider, MD  insulin glargine (LANTUS) 100 UNIT/ML injection Inject 0.05 mLs (5 Units total) into the skin at bedtime. Patient taking differently: Inject 15 Units into the skin at bedtime.  06/01/15  Yes Erick Colace, NP  loratadine (CLARITIN) 10 MG tablet Place 1 tablet (10 mg total) into feeding tube at  bedtime. Patient taking differently: 10 mg by PEG Tube route at bedtime.  06/01/15  Yes Erick Colace, NP  LORazepam (ATIVAN) 0.5 MG tablet Take 0.5 mg by mouth 3 (three) times daily as needed for anxiety.   Yes Historical Provider, MD  midodrine (PROAMATINE) 10 MG tablet Take 2 tablets (20 mg total) by mouth 3 (three) times daily with meals. Patient taking differently: 30 mg by PEG Tube route 3 (three) times daily with meals. 9am, 12pm, 6pm 06/01/15  Yes Erick Colace, NP  mirtazapine (REMERON) 7.5 MG tablet Place 1 tablet (7.5 mg total) into feeding tube at bedtime. Patient taking differently: 7.5 mg by PEG Tube route at bedtime as needed (sleep).  06/01/15  Yes Erick Colace, NP  Multiple Vitamin (MULTIVITAMIN WITH MINERALS) TABS tablet Place 1 tablet into feeding tube daily. Patient taking differently: 1 tablet by PEG Tube route daily.  06/01/15  Yes Erick Colace, NP  ondansetron (ZOFRAN ODT) 8 MG disintegrating tablet Place 1 tablet (8 mg total) into feeding tube every 8 (eight) hours as needed for nausea or vomiting. Patient taking differently: 8 mg by PEG Tube route every 8 (eight) hours as needed for nausea or vomiting.  06/01/15  Yes Erick Colace, NP  Oxycodone HCl 20 MG TABS Take 1 tablet (20 mg total) by mouth every 6 (six) hours as needed. With dressing changes and for break through pain but NO MORE THAN 4 PER DAY Patient taking differently: Take 20 mg by mouth every 6 (six) hours as needed (breakthrough pain). With dressing changes and for break through pain but NO MORE THAN 4 PER DAY 06/29/15  Yes Meredith Staggers, MD  pantoprazole sodium (PROTONIX) 40 mg/20 mL PACK Place 20 mLs (40 mg total) into feeding tube daily. Patient taking differently: 40 mg by PEG Tube route daily.  06/01/15  Yes Erick Colace, NP  pregabalin (LYRICA) 100 MG capsule Place 1 capsule (100 mg total) into feeding tube 3 (three) times daily. Take 100 mg by mouth in the morning, take 100 mg by mouth in the  afternoon, take 100 mg by mouth in  the evening and take 200 mg by mouth at bedtime. Patient taking differently: 100-200 mg by PEG Tube route 4 (four) times daily. Take 1 capsule (100 mg) by mouth daily at 9am, 12pm, and 6pm, take 2 capsules (200 mg) daily at bedtime - 10pm 06/01/15  Yes Erick Colace, NP  ACCU-CHEK SMARTVIEW test strip Use to check blood sugar 4 times daily. 05/16/15   Renato Shin, MD  ciprofloxacin (CIPRO) 500 MG tablet Take 1 tablet (500 mg total) by mouth 2 (two) times daily. Patient not taking: Reported on 07/10/2015 07/08/15   [name], MD  insulin aspart (NOVOLOG) 100 UNIT/ML injection Inject 5 Units into the skin 3 (three) times daily with meals. Patient not taking: Reported on 07/10/2015 01/14/15   Geradine Girt, DO  Insulin Syringes, Disposable, U-100 1 ML MISC Dispense 29G x 1/2 inch, use as directed, diagnosis code E 10.41 07/08/15   [name], MD  LORazepam (ATIVAN) 1 MG tablet Take 1 tablet (1 mg total) by mouth every 6 (six) hours as needed for anxiety. Patient not taking: Reported on 07/10/2015 06/03/15   [name], MD  Nutritional Supplements (FEEDING SUPPLEMENT, VITAL AF 1.2 CAL,) LIQD Place 1,000 mLs into feeding tube continuous. Patient not taking: Reported on 07/10/2015 06/01/15   Erick Colace, NP   Allergies  Allergen Reactions  . Cefuroxime Axetil Anaphylaxis  . Ertapenem Other (See Comments)    Rash and confusion-->tolerated Imipenem   . Morphine And Related Other (See Comments)    Changed mental status, confusion, headache, visual hallucination  . Penicillins Anaphylaxis and Other (See Comments)    Tolerated Imipenem; no reaction to 7 day course of amoxicillin in 2015 Has patient had a PCN reaction causing immediate rash, facial/tongue/throat swelling, SOB or lightheadedness with hypotension: Yes Has patient had a PCN reaction causing severe rash involving mucus membranes or skin necrosis: No Has patient had a PCN reaction that required  hospitalization Yes Has patient had a PCN reaction occurring within the last 10 years: No If all of the above answers are "NO", then may proceed with Cephalo  . Sulfa Antibiotics Anaphylaxis, Shortness Of Breath and Other (See Comments)  . Tessalon [Benzonatate] Anaphylaxis  . Shellfish Allergy Itching and Other (See Comments)    Took benadryl to alleviate reaction  . Nsaids Other (See Comments)    Risk of bleeding  . Miripirium Rash and Other (See Comments)    Change in mental status   CBC:    Component Value Date/Time   WBC 30.7* 07/14/2015 0346   HGB 11.9* 07/14/2015 0346   HCT 35.8* 07/14/2015 0346   PLT 666* 07/14/2015 0346   MCV 85.9 07/14/2015 0346   NEUTROABS 13.1* 07/11/2015 0440   LYMPHSABS 1.2 07/11/2015 0440   MONOABS 0.7 07/11/2015 0440   EOSABS 0.0 07/11/2015 0440   BASOSABS 0.0 07/11/2015 0440   Comprehensive Metabolic Panel:    Component Value Date/Time   NA 133* 07/14/2015 0346   K 4.4 07/14/2015 0346   CL 108 07/14/2015 0346   CO2 16* 07/14/2015 0346   BUN 45* 07/14/2015 0346   CREATININE 0.77 07/14/2015 0346   GLUCOSE 309* 07/14/2015 0346   CALCIUM 9.1 07/14/2015 0346   AST 17 07/14/2015 0346   ALT 13* 07/14/2015 0346   ALKPHOS 159* 07/14/2015 0346   BILITOT 0.4 07/14/2015 0346   PROT 6.7 07/14/2015 0346   ALBUMIN 1.8* 07/14/2015 0346    Physical Exam:  Vital Signs: BP 85/70 mmHg  Pulse 63  Temp(Src) 97.5 F (36.4 C) (Oral)  Resp 19  Ht 5' 7"  (1.702 m)  Wt 48.5 kg (106 lb 14.8 oz)  BMI 16.74 kg/m2  SpO2 99% SpO2: SpO2: 99 % O2 Device: O2 Device: Tracheostomy Collar O2 Flow Rate: O2 Flow Rate (L/min): 5 L/min Intake/output summary:  Intake/Output Summary (Last 24 hours) at 07/14/15 1601 Last data filed at 07/14/15 1234  Gross per 24 hour  Intake   1625 ml  Output   1150 ml  Net    475 ml   LBM: Last BM Date: 07/13/15 Baseline Weight: Weight: 44.8 kg (98 lb 12.3 oz) Most recent weight: Weight: 48.5 kg (106 lb 14.8 oz)  Exam  Findings:   General: NAD, lying in bed HEENT: Trach to trach collar CVS: RRR Resp: No labored breathing, converses with ease Abd: Soft, ND Neuro: Alert, oriented x 3           Palliative Performance Scale: 30 %                Additional Data Reviewed: Recent Labs     07/13/15  0321  07/14/15  0346  WBC  20.3*  30.7*  HGB  11.1*  11.9*  PLT  630*  666*  NA  133*  133*  BUN  51*  45*  CREATININE  0.89  0.77     Time In: 1420 Time Out: 1520 Time Total: 98mn  Greater than 50%  of this time was spent counseling and coordinating care related to the above assessment and plan.  Discussed plan of care with Dr. WSherral Hammers   Signed by:  AVinie Sill NP Palliative Medicine Team Pager # [phone](M-F 8a-5p) Team Phone # [phone](Nights/Weekends)

## 2015-07-14 NOTE — Progress Notes (Signed)
Patient refused to go on ventilator at this time. RT spoke with patient's father and will try again during 12am rounds.

## 2015-07-14 NOTE — Progress Notes (Signed)
Physical Therapy Wound Treatment Patient Details  Name: NEHEMIAH MCFARREN MRN: [id number] Date of Birth: [birth year]  Today's Date: 07/14/2015 Time: 0930-1008 Time Calculation (min): 38 min  Subjective  Subjective: "I'm doing okay" Patient and Family Stated Goals: None stated Date of Onset:  (Present on admission) Prior Treatments: Has received hydrotherapy on previous admission  Pain Score:   Moaning at times during treatment. No numerical value given when asked.  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 (Active)  Dressing Type Tape dressing;ABD;Barrier Film (skin prep);Gauze (Comment);Moist to dry;Other (Comment) 07/14/2015 10:29 AM  Dressing Changed;Clean;Dry;Intact 07/14/2015 10:29 AM  Dressing Change Frequency Daily 07/14/2015 10:29 AM  State of Healing Early/partial granulation 07/14/2015 10:29 AM  Site / Wound Assessment Pink;Red;Yellow;Granulation tissue 07/14/2015 10:29 AM  % Wound base Red or Granulating 75% 07/14/2015 10:29 AM  % Wound base Yellow 25% 07/14/2015 10:29 AM  Peri-wound Assessment Intact 07/14/2015 10:29 AM  Wound Length (cm) 8.4 cm 07/13/2015  9:44 AM  Wound Width (cm) 7.3 cm 07/13/2015  9:44 AM  Wound Depth (cm) 2.7 cm 07/13/2015  9:44 AM  Undermining (cm) 4.2 cm from 12:00 to 1:00 07/13/2015  9:44 AM  Margins Unattached edges (unapproximated) 07/14/2015 10:29 AM  Drainage Amount Moderate 07/14/2015 10:29 AM  Drainage Description Serosanguineous;Odor 07/14/2015 10:29 AM  Treatment Cleansed;Hydrotherapy (Pulse lavage);Packing (Saline gauze);Other (Comment) 07/14/2015 10:29 AM     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 (Active)  Dressing Type ABD;Barrier Film (skin prep);Gauze (Comment);Moist to dry;Tape dressing 07/14/2015 10:29 AM  Dressing Changed;Clean;Dry;Intact 07/14/2015 10:29 AM  Dressing Change Frequency Daily 07/14/2015 10:29 AM   State of Healing Early/partial granulation 07/14/2015 10:29 AM  Site / Wound Assessment Granulation tissue;Pink;Yellow;Red;Brown 07/14/2015 10:29 AM  % Wound base Red or Granulating 70% 07/14/2015 10:29 AM  % Wound base Yellow 20% 07/14/2015 10:29 AM  % Wound base Black 0% 07/14/2015 10:29 AM  % Wound base Other (Comment) 10% 07/14/2015 10:29 AM  Peri-wound Assessment Pink 07/14/2015 10:29 AM  Wound Length (cm) 10.4 cm 07/14/2015 10:29 AM  Wound Width (cm) 12.4 cm 07/14/2015 10:29 AM  Wound Depth (cm) 2.9 cm 07/14/2015 10:29 AM  Undermining (cm) From 8:00 to 12:00 3.5 cm - From 12:00 to 3:00 4.0 cm 07/14/2015 10:29 AM  Margins Unattached edges (unapproximated) 07/14/2015 10:29 AM  Drainage Amount Moderate 07/14/2015 10:29 AM  Drainage Description Serosanguineous;Green;Odor 07/14/2015 10:29 AM  Treatment Cleansed;Debridement (Selective);Hydrotherapy (Pulse lavage);Packing (Saline gauze);Tape changed;Other (Comment) 07/14/2015 10:29 AM        Santyl applied to wound bed prior to applying dressing.  Hydrotherapy Pulsed lavage therapy - wound location: Lt ischial tuberosity and sacrum Pulsed Lavage with Suction (psi): 8 psi Pulsed Lavage with Suction - Normal Saline Used: 1000 mL Pulsed Lavage Tip: Tip with splash shield Selective Debridement Selective Debridement - Location: Lt ischial tuberosity and Sacrum Selective Debridement - Tools Used: Scissors;Forceps Selective Debridement - Tissue Removed: Yellow slough, Brown eschar   Wound Assessment and Plan  Wound Therapy - Assess/Plan/Recommendations Wound Therapy - Clinical Statement: Performed cross hatching of eschar, selective debridement of non-viable tissue. Continue with current plan Wound Therapy - Functional Problem List: Pt unable to tolerate prolonged sitting due to pressure ulcers over sacrum and ischial tuberosities Factors Delaying/Impairing Wound Healing: Incontinence;Infection - systemic/local;Polypharmacy;Diabetes Mellitus;Altered  sensation;Immobility;Multiple medical problems Hydrotherapy Plan: Debridement;Dressing change;Patient/family education;Pulsatile lavage with suction Wound Therapy - Frequency: 6X /  week Wound Therapy - Follow Up Recommendations: Other (comment) (LTACH vs SNF) Wound Plan: see above  Wound Therapy Goals- Improve the function of patient's integumentary system by progressing the wound(s) through the phases of wound healing (inflammation - proliferation - remodeling) by: Decrease Necrotic Tissue to: 10 Decrease Necrotic Tissue - Progress: Progressing toward goal Increase Granulation Tissue to: 90 Increase Granulation Tissue - Progress: Progressing toward goal Improve Drainage Characteristics: Min Improve Drainage Characteristics - Progress: Progressing toward goal  Goals will be updated until maximal potential achieved or discharge criteria met.  Discharge criteria: when goals achieved, discharge from hospital, MD decision/surgical intervention, no progress towards goals, refusal/missing three consecutive treatments without notification or medical reason.  GP     Candie Mile S 07/14/2015, 10:40 AM Elayne Snare, [place]

## 2015-07-14 NOTE — Progress Notes (Signed)
Patient taken off of ventilator and placed on trach collar per fathers request. RT will continue to monitor.

## 2015-07-14 NOTE — Progress Notes (Signed)
Patient seen for trach team follow up.  No education needed at this time.  All needed equipment at bedside.  Will continue to follow for progression.

## 2015-07-15 ENCOUNTER — Inpatient Hospital Stay (HOSPITAL_COMMUNITY): Payer: Medicaid Other

## 2015-07-15 DIAGNOSIS — L89154 Pressure ulcer of sacral region, stage 4: Secondary | ICD-10-CM | POA: Diagnosis present

## 2015-07-15 DIAGNOSIS — L89324 Pressure ulcer of left buttock, stage 4: Secondary | ICD-10-CM | POA: Diagnosis present

## 2015-07-15 DIAGNOSIS — T83511A Infection and inflammatory reaction due to indwelling urethral catheter, initial encounter: Secondary | ICD-10-CM

## 2015-07-15 DIAGNOSIS — E43 Unspecified severe protein-calorie malnutrition: Secondary | ICD-10-CM

## 2015-07-15 DIAGNOSIS — L89314 Pressure ulcer of right buttock, stage 4: Secondary | ICD-10-CM | POA: Diagnosis present

## 2015-07-15 LAB — CBC WITH DIFFERENTIAL/PLATELET
Basophils Absolute: 0 10*3/uL (ref 0.0–0.1)
Basophils Relative: 0 %
EOS PCT: 2 %
Eosinophils Absolute: 0.5 10*3/uL (ref 0.0–0.7)
HEMATOCRIT: 35 % — AB (ref 39.0–52.0)
HEMOGLOBIN: 11.2 g/dL — AB (ref 13.0–17.0)
Lymphocytes Relative: 7 %
Lymphs Abs: 1.8 10*3/uL (ref 0.7–4.0)
MCH: 27.7 pg (ref 26.0–34.0)
MCHC: 32 g/dL (ref 30.0–36.0)
MCV: 86.4 fL (ref 78.0–100.0)
MONOS PCT: 13 %
Monocytes Absolute: 3.4 10*3/uL — ABNORMAL HIGH (ref 0.1–1.0)
NEUTROS PCT: 78 %
Neutro Abs: 20.1 10*3/uL — ABNORMAL HIGH (ref 1.7–7.7)
Platelets: 534 10*3/uL — ABNORMAL HIGH (ref 150–400)
RBC: 4.05 MIL/uL — AB (ref 4.22–5.81)
RDW: 15.9 % — ABNORMAL HIGH (ref 11.5–15.5)
WBC: 25.8 10*3/uL — AB (ref 4.0–10.5)

## 2015-07-15 LAB — LACTIC ACID, PLASMA: LACTIC ACID, VENOUS: 0.8 mmol/L (ref 0.5–2.0)

## 2015-07-15 LAB — COMPREHENSIVE METABOLIC PANEL
ALK PHOS: 132 U/L — AB (ref 38–126)
ALT: 9 U/L — AB (ref 17–63)
AST: 12 U/L — ABNORMAL LOW (ref 15–41)
Albumin: 1.6 g/dL — ABNORMAL LOW (ref 3.5–5.0)
Anion gap: 8 (ref 5–15)
BILIRUBIN TOTAL: 0.3 mg/dL (ref 0.3–1.2)
BUN: 35 mg/dL — ABNORMAL HIGH (ref 6–20)
CALCIUM: 9.5 mg/dL (ref 8.9–10.3)
CO2: 17 mmol/L — AB (ref 22–32)
CREATININE: 0.64 mg/dL (ref 0.61–1.24)
Chloride: 116 mmol/L — ABNORMAL HIGH (ref 101–111)
Glucose, Bld: 203 mg/dL — ABNORMAL HIGH (ref 65–99)
Potassium: 4.4 mmol/L (ref 3.5–5.1)
Sodium: 141 mmol/L (ref 135–145)
Total Protein: 6.5 g/dL (ref 6.5–8.1)

## 2015-07-15 LAB — GLUCOSE, CAPILLARY
GLUCOSE-CAPILLARY: 124 mg/dL — AB (ref 65–99)
GLUCOSE-CAPILLARY: 184 mg/dL — AB (ref 65–99)
GLUCOSE-CAPILLARY: 231 mg/dL — AB (ref 65–99)
Glucose-Capillary: 167 mg/dL — ABNORMAL HIGH (ref 65–99)
Glucose-Capillary: 189 mg/dL — ABNORMAL HIGH (ref 65–99)
Glucose-Capillary: 199 mg/dL — ABNORMAL HIGH (ref 65–99)

## 2015-07-15 LAB — CULTURE, BLOOD (ROUTINE X 2): CULTURE: NO GROWTH

## 2015-07-15 LAB — MAGNESIUM: MAGNESIUM: 1.3 mg/dL — AB (ref 1.7–2.4)

## 2015-07-15 MED ORDER — INSULIN GLARGINE 100 UNIT/ML ~~LOC~~ SOLN
24.0000 [IU] | Freq: Every day | SUBCUTANEOUS | Status: DC
  Administered 2015-07-15 – 2015-07-16 (×2): 24 [IU] via SUBCUTANEOUS
  Filled 2015-07-15 (×2): qty 0.24

## 2015-07-15 MED ORDER — FOSFOMYCIN TROMETHAMINE 3 G PO PACK
3.0000 g | PACK | ORAL | Status: AC
  Administered 2015-07-16 – 2015-07-18 (×2): 3 g via ORAL
  Filled 2015-07-15 (×2): qty 3

## 2015-07-15 MED ORDER — INSULIN ASPART 100 UNIT/ML ~~LOC~~ SOLN
0.0000 [IU] | SUBCUTANEOUS | Status: DC
  Administered 2015-07-15 (×2): 4 [IU] via SUBCUTANEOUS
  Administered 2015-07-15: 3 [IU] via SUBCUTANEOUS
  Administered 2015-07-16: 4 [IU] via SUBCUTANEOUS
  Administered 2015-07-16: 3 [IU] via SUBCUTANEOUS

## 2015-07-15 MED ORDER — MAGNESIUM SULFATE 50 % IJ SOLN
3.0000 g | Freq: Once | INTRAVENOUS | Status: AC
  Administered 2015-07-15: 3 g via INTRAVENOUS
  Filled 2015-07-15: qty 6

## 2015-07-15 MED ORDER — RESOURCE THICKENUP CLEAR PO POWD
ORAL | Status: DC | PRN
  Filled 2015-07-15 (×2): qty 125

## 2015-07-15 NOTE — Progress Notes (Signed)
Patient disconnected himself from ventilator. Patient asked to be put back on ATC. RT placed patient on ATC, deflated his cuff, and placed speaking valve on the trach. Patient is resting comfortably with O2 Sat of 96%. RT will continue to monitor.

## 2015-07-15 NOTE — Care Management Note (Signed)
Case Management Note  Patient Details  Name: Jason [name] MRN: [id number] Date of Birth: 02/27/1986  Subjective/Objective:   Pt lives with mom and dad, has AFT bed and W/C, private duty nursing with [employer].                 Action/Plan: CM will continue to follow  Expected Discharge Plan:  [place]  In-House Referral:     Discharge planning Services  CM Consult  HH Arranged:  RN [facility] Agency:  [place]  Status of Service:  In process, will continue to follow   Girard Cooter, RN 07/15/2015, 10:53 AM

## 2015-07-15 NOTE — Progress Notes (Signed)
Physical Therapy Wound Treatment Patient Details  Name: Jason [name] MRN: [id number] Date of Birth: Jun 16, 1986  Today's Date: 07/15/2015 Time: 0915-1001 Time Calculation (min): 46 min  Subjective  Subjective: Can I play music while ya'll working Patient and Family Stated Goals: None stated Date of Onset:  (Present on admission) Prior Treatments: Has received hydrotherapy on previous admission  Pain Score:   "My legs have been hurting." no numerical value given.  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 (Active)  Dressing Type Tape dressing;ABD;Barrier Film (skin prep);Gauze (Comment);Moist to dry;Other (Comment) 07/15/2015 11:00 AM  Dressing Changed;Clean;Dry;Intact 07/15/2015 11:00 AM  Dressing Change Frequency Daily 07/15/2015 11:00 AM  State of Healing Early/partial granulation 07/15/2015 11:00 AM  Site / Wound Assessment Pink;Red;Yellow;Granulation tissue 07/15/2015 11:00 AM  % Wound base Red or Granulating 75% 07/15/2015 11:00 AM  % Wound base Yellow 25% 07/15/2015 11:00 AM  Peri-wound Assessment Intact 07/15/2015 11:00 AM  Wound Length (cm) 8.4 cm 07/13/2015  9:44 AM  Wound Width (cm) 7.3 cm 07/13/2015  9:44 AM  Wound Depth (cm) 2.7 cm 07/13/2015  9:44 AM  Undermining (cm) 4.2 cm from 12:00 to 1:00 07/13/2015  9:44 AM  Margins Unattached edges (unapproximated) 07/15/2015 11:00 AM  Drainage Amount Moderate 07/15/2015 11:00 AM  Drainage Description Serosanguineous;Odor 07/15/2015 11:00 AM  Treatment Cleansed;Debridement (Selective);Hydrotherapy (Pulse lavage);Tape changed;Packing (Saline gauze);Other (Comment) 07/15/2015 11:00 AM     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 (Active)  Dressing Type ABD;Barrier Film (skin prep);Gauze (Comment);Moist to dry;Tape dressing 07/15/2015 11:00 AM  Dressing Changed;Clean;Dry;Intact 07/15/2015 11:00 AM   Dressing Change Frequency Daily 07/15/2015 11:00 AM  State of Healing Early/partial granulation 07/15/2015 11:00 AM  Site / Wound Assessment Granulation tissue;Pink;Yellow;Red;Brown 07/15/2015 11:00 AM  % Wound base Red or Granulating 75% 07/15/2015 11:00 AM  % Wound base Yellow 15% 07/15/2015 11:00 AM  % Wound base Black 0% 07/15/2015 11:00 AM  % Wound base Other (Comment) 10% 07/15/2015 11:00 AM  Peri-wound Assessment Pink 07/15/2015 11:00 AM  Wound Length (cm) 10.4 cm 07/14/2015 10:29 AM  Wound Width (cm) 12.4 cm 07/14/2015 10:29 AM  Wound Depth (cm) 2.9 cm 07/14/2015 10:29 AM  Undermining (cm) From 8:00 to 12:00 3.5 cm - From 12:00 to 3:00 4.0 cm 07/14/2015 10:29 AM  Margins Unattached edges (unapproximated) 07/15/2015 11:00 AM  Drainage Amount Moderate 07/15/2015 11:00 AM  Drainage Description Serosanguineous;Green;Odor 07/15/2015 11:00 AM  Treatment Cleansed;Debridement (Selective);Hydrotherapy (Pulse lavage);Packing (Saline gauze);Tape changed;Other (Comment) 07/15/2015 11:00 AM     Santyl applied to wound bed prior to applying dressing.  Hydrotherapy Pulsed lavage therapy - wound location: Lt ischial tuberosity and sacrum Pulsed Lavage with Suction (psi): 8 psi Pulsed Lavage with Suction - Normal Saline Used: 1000 mL Pulsed Lavage Tip: Tunneling tip Selective Debridement Selective Debridement - Location: Lt ischial tuberosity and Sacrum Selective Debridement - Tools Used: Scissors;Forceps Selective Debridement - Tissue Removed: Yellow slough, Brown eschar   Wound Assessment and Plan  Wound Therapy - Assess/Plan/Recommendations Wound Therapy - Clinical Statement: Brown perimeter eschar softening but not able to remove. More yellow eschar from central wound removed. Wound Therapy - Functional Problem List: Pt unable to tolerate prolonged sitting due to pressure ulcers over sacrum and ischial tuberosities Factors Delaying/Impairing Wound Healing: Incontinence;Infection -  systemic/local;Polypharmacy;Diabetes Mellitus;Altered sensation;Immobility;Multiple medical problems Hydrotherapy Plan: Debridement;Dressing change;Patient/family education;Pulsatile lavage with  suction Wound Therapy - Frequency: 6X / week Wound Therapy - Follow Up Recommendations: Other (comment) (LTACH vs SNF) Wound Plan: see above  Wound Therapy Goals- Improve the function of patient's integumentary system by progressing the wound(s) through the phases of wound healing (inflammation - proliferation - remodeling) by: Decrease Necrotic Tissue to: 10 Decrease Necrotic Tissue - Progress: Progressing toward goal Increase Granulation Tissue to: 90 Increase Granulation Tissue - Progress: Progressing toward goal Improve Drainage Characteristics: Min Improve Drainage Characteristics - Progress: Progressing toward goal  Goals will be updated until maximal potential achieved or discharge criteria met.  Discharge criteria: when goals achieved, discharge from hospital, MD decision/surgical intervention, no progress towards goals, refusal/missing three consecutive treatments without notification or medical reason.  GP     Candie Mile S 07/15/2015, 12:02 PM  Elayne Snare, [place]

## 2015-07-15 NOTE — Progress Notes (Signed)
RT spoke with patient and patient agreed to stay on the ventilator. Patient is resting comfortably. RT will continue to monitor.

## 2015-07-15 NOTE — Progress Notes (Signed)
RT placed patient on Ventilator. PT agreed to wear it for 1 hour. Patient is resting comfortably. RT will continue to monitor.

## 2015-07-15 NOTE — Progress Notes (Signed)
Pegtube site and suprapubic cath site is cleansed with NS, dried and dry dressing applied.  Colostomy bag leaking and site is cleansed and replaced with new colostomy bag.

## 2015-07-15 NOTE — Progress Notes (Signed)
Speech Language Pathology  Patient Details Name: Jason [name] MRN: [id number] DOB: November 26, 1985 Today's Date: 07/15/2015 Time:  -       Pt scheduled for MBS today at 1330.    Orbie Pyo [place].Ed [employer] [phone]

## 2015-07-15 NOTE — Progress Notes (Signed)
[facility] for Infectious Disease    Date of Admission:  07/10/2015   Total days of antibiotics 6        Day 6 vanco        Day 6 imi        Day 1 fosfomycin   ID: Jason [name] is a 29 y.o. 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 antiseptic oral rinse  7 mL Mouth Rinse QID  . baclofen  10 mg Per Tube 2 times per day  . baclofen  20 mg Per Tube QHS  . chlorhexidine gluconate  15 mL Mouth Rinse BID  . collagenase   Topical Daily  . enoxaparin (LOVENOX) injection  30 mg Subcutaneous Daily  . fentaNYL  50 mcg Transdermal Q72H  . [START ON 07/16/2015] fosfomycin  3 g Oral Q48H  . imipenem-cilastatin  250 mg Intravenous 3 times per day  . Influenza vac split quadrivalent PF  0.5 mL Intramuscular Tomorrow-1000  . insulin aspart  0-20 Units Subcutaneous 6 times per day  . insulin glargine  24 Units Subcutaneous QHS  . midodrine  30 mg Per Tube TID WC  . multivitamin with minerals  1 tablet Per Tube Daily  . pantoprazole sodium  40 mg Per Tube Daily  . pregabalin  100 mg Per Tube 3 times per day  . pregabalin  200 mg Per Tube QHS  . vancomycin  500 mg Intravenous Q24H    Objective: Vital signs in last 24 hours: Temp:  [97.3 F (36.3 C)-98.5 F (36.9 C)] 98.4 F (36.9 C) (10/07 1553) Pulse Rate:  [60-75] 69 (10/07 1553) Resp:  [11-20] 18 (10/07 1553) BP: (94-146)/(45-96) 118/74 mmHg (10/07 1553) SpO2:  [96 %-100 %] 100 % (10/07 1553) FiO2 (%):  [28 %-40 %] 28 % (10/07 1610) Weight:  [110 lb 0.2 oz (49.9 kg)]  110 lb 0.2 oz (49.9 kg) (10/07 0500) Did not examine  Lab Results  Recent Labs  07/14/15 0346 07/15/15 1034  WBC 30.7* 25.8*  HGB 11.9* 11.2*  HCT 35.8* 35.0*  NA 133* 141  K 4.4 4.4  CL 108 116*  CO2 16* 17*  BUN 45* 35*  CREATININE 0.77 0.64   Liver Panel  Recent Labs  07/14/15 0346 07/15/15 1034  PROT 6.7 6.5  ALBUMIN 1.8* 1.6*  AST 17 12*  ALT 13* 9*  ALKPHOS 159* 132*  BILITOT 0.4 0.3    Microbiology:  10/2 blood cx ngtd 10/3 urine cx PsA ami S Studies/Results: Dg Swallowing Func-speech Pathology  07/15/2015    Objective Swallowing Evaluation:   Modified Barium Swallow Patient Details  Name: Jason [name] MRN: [id number] Date of Birth: [birth year]  Today's Date: 07/15/2015 Time: SLP Start Time (ACUTE ONLY): 1347-SLP Stop Time (ACUTE ONLY): 1405 SLP Time Calculation (min) (ACUTE ONLY): 18 min  Past Medical History:  Past Medical History  Diagnosis Date  . GERD (gastroesophageal reflux disease)   . Asthma   . Hx MRSA infection     on  face  . Gastroparesis   . Diabetic neuropathy ([name])   . Seizures ([place])   . Family history of anesthesia complication     Pt mother can't have epidural procedures  . Dysrhythmia   . Pneumonia   . Arthritis   . Fibromyalgia   . 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 Type I diabetes mellitus ([place])     sees Dr. Loanne Drilling   . Syncope 02/16/2015   Past Surgical History:  Past Surgical History  Procedure Laterality Date  . Tonsillectomy    . Multiple extractions with alveoloplasty N/A 08/03/2014    Procedure: MULTIPLE EXTRACTIONS;  Surgeon: Gae Bon, DDS;   Location: [facility];  Service: Oral Surgery;  Laterality: N/A;  . Tee without cardioversion N/A 08/17/2014    Procedure: TRANSESOPHAGEAL ECHOCARDIOGRAM (TEE);  Surgeon: Dorothy Spark, MD;  [facility] ENDOSCOPY;  Service: Cardiovascular;   Laterality: N/A;  . Debridment of decubitus ulcer N/A 10/04/2014    Procedure: DEBRIDMENT OF DECUBITUS ULCER;  Surgeon: Georganna Skeans, MD;   Location: [place];  Service: General;  Laterality: N/A;  . Laparoscopic diverted colostomy N/A 10/12/2014    Procedure: LAPAROSCOPIC DIVERTING COLOSTOMY;  Surgeon: Donnie Mesa,  MD;  Location: [facility];  Service: General;  Laterality: N/A;  . Insertion of suprapubic catheter N/A 10/12/2014    Procedure: INSERTION OF SUPRAPUBIC CATHETER;  Surgeon: Reece Packer, MD;  Location: [place];  Service: Urology;  Laterality: N/A;  . Incision and drainage of wound N/A 11/12/2014    Procedure: IRRIGATION AND DEBRIDEMENT OF WOUNDS WITH BONE BIOPSY AND  SURGICAL PREP ;  Surgeon: Theodoro Kos, DO;  Location: [place];  Service:  Plastics;  Laterality: N/A;  . Application of a-cell of back N/A 11/12/2014    Procedure: APPLICATION A CELL AND VAC ;  Surgeon: Theodoro Kos, DO;   Location: [place];  Service: Plastics;  Laterality: N/A;  . Incision and drainage of wound N/A 11/18/2014    Procedure: IRRIGATION AND DEBRIDEMENT OF SACRAL ULCER ONLY WITH  PLACEMENT OF A CELL AND VAC/ DRESSING CHANGE TO UPPER BACK AREA.;   Surgeon: Theodoro Kos, DO;  Location: [place];  Service: Plastics;   Laterality: N/A;  . Incision and drainage of wound N/A 11/25/2014    Procedure: IRRIGATION AND DEBRIDEMENT OF SACRAL ULCER AND BACK BURN WITH  PLACEMENT OF A-CELL;  Surgeon: Theodoro Kos, DO;  Location: [place];   Service: Plastics;  Laterality: N/A;  . Minor application of wound vac N/A 11/25/2014    Procedure:  WOUND VAC CHANGE;  Surgeon: Theodoro Kos, DO;  Location: [place];  Service: Plastics;  Laterality: N/A;  . Incision and drainage of wound Right 12/08/2014    Procedure: IRRIGATION AND DEBRIDEMENT SACRAL WOUND AND RIGHT ISCHIAL  WOUND ;  Surgeon: Theodoro Kos, DO;  Location: [place];  Service: Plastics;   Laterality: Right;  . Application of a-cell of back N/A 12/08/2014    Procedure: APPLICATION OF A-CELL AND WOUND VAC ;  Surgeon: Theodoro Kos, DO;  Location: [place];  Service: Plastics;  Laterality: N/A;  . Tee without cardioversion N/A 05/05/2015     Procedure: TRANSESOPHAGEAL ECHOCARDIOGRAM (TEE);  Surgeon: Lelon Perla, MD;  Location: [place];  Service: Cardiovascular;   Laterality: N/A;  . Incision and drainage of wound Bilateral 05/05/2015    Procedure: IRRIGATION AND DEBRIDEMENT SACRAL ULCER;  Surgeon: Theodoro Kos, DO;  Location: [place];  Service: Clinical cytogeneticist;  Laterality: Bilateral;  . Hematoma evacuation N/A 05/05/2015    Procedure: EVACUATION HEMATOMA bedside procedure;  Surgeon: Theodoro Kos, DO;  Location: [place];  Service: Plastics;  Laterality: N/A;   HPI:  Other Pertinent Information: 29 y.o. male with history of spinal cord  injury status post paraplegia who was recently admitted for MRSA and  microaerophilic strep bacteremia with respiratory failure requiring trach;  nocturnal vent. Admitted due to AMS. Found to have UTI, hypothermia,  hypotension, toxic metabolic encephalopathy. In August 2016 pt seen for  in-line Passy-Muir Speaking valve, trach'd and continued with PMSV. MBS  05/2015 recommended NPO, received PEG. Today repeat MBS for possible  initiation of food/liquid. He had Speaking valve from prior admission and  currently in place.   No Data Recorded  Assessment / Plan / Recommendation CHL IP CLINICAL IMPRESSIONS 07/15/2015  Therapy Diagnosis Mild oral phase dysphagia;Mild pharyngeal phase  dysphagia;Moderate pharyngeal phase dysphagia  Clinical Impression MBS performed with PMSV in place. Mild prolonged  mastication and oral transit with solid texture. Pharyngeal phase  characterized by motor impairments with silent laryngeal penetration with  nectar with chin tuck posture increasing amount and depth of penetration.  Mild-mod vallecular and pyriform sinus residue reduced with verbal cues  for second swallow. Honey thick liquids recommended with second swallow  (aspiration risk to high during the swallow with nectar), Dys 2 diet, full  assist for feeding and precautions, swallow twice following bites/sips,  wear Passy-Muir speaking  valve with all meals/meds and upright posture,  continued ST.        CHL IP TREATMENT RECOMMENDATION 07/15/2015  Treatment Recommendations Therapy as outlined in treatment plan below     CHL IP DIET RECOMMENDATION 07/15/2015  SLP Diet Recommendations Dysphagia 2 (Fine chop)  Liquid Administration via (None)  Medication Administration Whole meds with puree  Compensations Slow rate;Small sips/bites;Multiple dry swallows after each  bite/sip  Postural Changes and/or Swallow Maneuvers (None)     CHL IP OTHER RECOMMENDATIONS 07/15/2015  Recommended Consults (None)  Oral Care Recommendations Oral care BID  Other Recommendations (None)     CHL IP FOLLOW UP RECOMMENDATIONS 05/27/2015  Follow up Recommendations 24 hour supervision/assistance     CHL IP FREQUENCY AND DURATION 07/15/2015  Speech Therapy Frequency (ACUTE ONLY) min 2x/week  Treatment Duration 2 weeks     Pertinent Vitals/Pain none    SLP Swallow Goals No flowsheet data found.  No flowsheet data found.    CHL IP REASON FOR REFERRAL 07/15/2015  Reason for Referral Objectively evaluate swallowing function               No flowsheet data found.         [name] 07/15/2015, 2:52 PM  Orbie Pyo [place].Ed CCC-SLP Pager [phone]        Assessment/Plan: Catheter associated complicated UTI with MDR PsA = will treat with 3 doses of fosfomycin, Q48hr. Which is estimated to have 75-80% effectiveness. If becomes clinical unstable, would start amikacin  Sacral decub/osteomyelitis = has had numerous courses of antibiotics. Currently on vancomycin and imipenem. Known to have MRSA in deep tissue/bone cx in July. Will check sed rate and crp. Continue on vancomycin. Will reassess discontinue imipenem early next week when wound care is being done to see if states of wounds after 1 wk of antibiotics  Severe protein-caloric malnutrition = continue with encouraging good po intake  Dr. Linus Salmons available for questions over the weekend, will see back on monday  [facility] Fl[facility]  [facility] for Infectious Diseases Cell: [phone] Pager: [phone]  07/15/2015, 6:25 PM

## 2015-07-15 NOTE — Progress Notes (Signed)
[place] TEAM 1 - Stepdown/ICU TEAM Progress Note  MAKELL CYR QQP:[id number] DOB: 1985/11/24 DOA: 07/10/2015 PCP: [name], MD  Admit HPI / Brief Narrative: ADD DINAPOLI is a 29 y.o. BM PMHx Spinal cord injury status post paraplegia who was recently admitted for MRSA and microaerophilic strep bacteremia with respiratory failure requiring trach and is the patient is on vent in the nights was brought to the ER after patient was found to have a hallucination and confusion as per patient's mother. Patient was recently tapered off his IV antibiotics and was placed on doxycycline and Cipro was started 3 days ago for possible UTI.   Over the last 3 days patient was noticed to have increasing confusion and hallucination as per patient's mother. In the ER patient UA shows features concerning for UTI and patient has chronic suprapubic catheter. Patient is also found to be hypothermic and was also hypotensive which improved with fluid bolus. Patient has had previous history of multiple debridement for chronic sacral decubitus which at this time looks healing. Patient also has history of chronic osteomyelitis. Patient has been admitted for possible developing sepsis. On call pulmonary critical care has been consulted and patient has been admitted for further management.  HPI/Subjective: 10/7  A/O 4, cognition significantly improved. Negative CP, negative SOB. Patient only wants to have food.  Assessment/Plan: Toxic metabolic encephalopathy -Mental status at baseline  Sepsis due to UTI positive MDR Pseudomonas Aeruginosa(cause/exacerbated by chronic indwelling cath)  -UA convincing for UTI - WBC 19.8 + Temp <36 = sepsis - appears to be responding to IVF and abx  -Complete 5 day course of antibiotics - blood cultures pending -Now patient has been on several days of antibiotics replace his suprapubic cath    Acute renal failure -Due to sepsis - improving w/ volume expansion    Hyponatremia  -likely simple due to hypovolemia  -Resolved   Diabetes mellitus type 1 with hyperglycemia -Well controlled - Increase Lantus 24 units daily -Increase to resistant SSI  Sacral decubitus/bilateral ischeal ulcers and left chest wall decubitus (osteomyelitis) -Wound team consulted -Hydrotherapy daily -After a few days of hydrotherapy will consult for wound Vac -[place]ne Bed maintain in rotational mode  Chronic anemia -Worsening secondary to hemodilution  -Transfuse for hemoglobin <7  -10/4 Transfuse 2 units PRBC  -Stable  Chronic respiratory failure with trach and vent -pulmonary critical care following and managing vent  -PCXR in a.m.  Hypomagnesemia -Magnesium IV 3 gm  History of spinal cord injury with paraplegia  Nutrition -Passed MBS today, dysphagia 2 diet honey thick liquid   Goals of care -Palliative care consult placed. Patient with spinal cord injury resulting in paraplegia, indwelling cath with multiple infections, nonhealing multiple decubitus ulcers to sacrum and bilateral buttocks resulting in frequent hospitalization; short-term vs long-term goals of care   Code Status: FULL Family Communication: None  Disposition Plan: SNF? Will await recommendations from palliative care    Consultants: PCCM - vent management   Procedure/Significant Events:    Culture 10/2 blood left negative 10/3 blood right forearm NGTD 10/3 urine positive MDR Pseudomonas Aeruginosa  10/3 MRSA by PCR negative  Antibiotics: Imipenem 10/2 > Vanc 10/2 >  DVT prophylaxis: lovenox    Devices    LINES / TUBES:      Continuous Infusions: . sodium chloride 50 mL/hr at 07/14/15 2240  . feeding supplement (VITAL AF 1.2 CAL) 1,000 mL (07/14/15 1546)    Objective: VITAL SIGNS: Temp: 98.4 F (36.9 C) (10/07 1553) Temp  Source: Oral (10/07 1553) BP: 153/87 mmHg (10/07 1921) Pulse Rate: 71 (10/07 1921) SPO2; FIO2:   Intake/Output Summary (Last  24 hours) at 07/15/15 2029 Last data filed at 07/15/15 1800  Gross per 24 hour  Intake   2550 ml  Output    875 ml  Net   1675 ml     Exam: General: A/O 4, NAD, No acute respiratory distress Eyes: Negative headache, eye pain, double vision,negative scleral hemorrhage ENT: Negative Runny nose, negative ear pain, negative gingival bleeding, Neck:  Negative scars, masses, torticollis, lymphadenopathy, JVD Lungs: Clear to auscultation bilaterally without wheezes or crackles Cardiovascular: Regular rate and rhythm without murmur gallop or rub normal S1 and S2 Abdomen:negative abdominal pain, nondistended, positive soft, bowel sounds, no rebound, no ascites, no appreciable mass, suprapubic catheter in place negative discharge or sign of infection Extremities: atrophy, large stage 4 sacral decubitus ulcer, bilateral ischeal stage IV decubitus ulcers, unstageable left heel and toe ulcer Psychiatric:  Negative depression, negative anxiety, negative fatigue, negative mania  Neurologic:  Cranial nerves II through XII intact, tongue/uvula midline, bilateral upper extremity gross muscles strength 2/5, bilateral lower extremity paralysis negative dysarthria, negative expressive aphasia, negative receptive aphasia.   Data Reviewed: Basic Metabolic Panel:  Recent Labs Lab 07/11/15 0440 07/12/15 0310 07/13/15 0321 07/14/15 0346 07/15/15 1034  NA 127* 133* 133* 133* 141  K 4.8 4.9 5.0 4.4 4.4  CL 103 112* 113* 108 116*  CO2 14* 14* 14* 16* 17*  GLUCOSE 308* 90 254* 309* 203*  BUN 68* 60* 51* 45* 35*  CREATININE 1.29* 1.05 0.89 0.77 0.64  CALCIUM 9.7 9.1 8.9 9.1 9.5  MG  --   --   --   --  1.3*   Liver Function Tests:  Recent Labs Lab 07/11/15 0440 07/12/15 0310 07/13/15 0321 07/14/15 0346 07/15/15 1034  AST 20 18 18 17  12*  ALT 11* 13* 11* 13* 9*  ALKPHOS 139* 127* 146* 159* 132*  BILITOT 0.3 0.3 0.4 0.4 0.3  PROT 7.1 6.5 6.9 6.7 6.5  ALBUMIN 2.0* 1.8* 1.8* 1.8* 1.6*   No  results for input(s): LIPASE, AMYLASE in the last 168 hours. No results for input(s): AMMONIA in the last 168 hours. CBC:  Recent Labs Lab 07/10/15 1633  07/11/15 0440 07/12/15 0310 07/13/15 0321 07/14/15 0346 07/15/15 1034  WBC 19.8*  --  15.0* 16.4* 20.3* 30.7* 25.8*  NEUTROABS 14.0*  --  13.1*  --   --   --  20.1*  HGB 8.7*  < > 7.9* 7.1* 11.1* 11.9* 11.2*  HCT 26.9*  < > 25.2* 22.5* 33.8* 35.8* 35.0*  MCV 84.3  --  83.4 83.0 84.5 85.9 86.4  PLT 787*  --  669* 614* 630* 666* 534*  < > = values in this interval not displayed. Cardiac Enzymes: No results for input(s): CKTOTAL, CKMB, CKMBINDEX, TROPONINI in the last 168 hours. BNP (last 3 results) No results for input(s): BNP in the last 8760 hours.  ProBNP (last 3 results) No results for input(s): PROBNP in the last 8760 hours.  CBG:  Recent Labs Lab 07/15/15 0429 07/15/15 0827 07/15/15 1209 07/15/15 1626 07/15/15 2000  GLUCAP 184* 167* 189* 199* 124*    Recent Results (from the past 240 hour(s))  Blood culture (routine x 2)     Status: None   Collection Time: 07/10/15  4:25 PM  Result Value Ref Range Status   Specimen Description BLOOD LEFT ARM  Final   Special Requests BOTTLES DRAWN  AEROBIC AND ANAEROBIC 5CC  Final   Culture NO GROWTH 5 DAYS  Final   Report Status 07/15/2015 FINAL  Final  MRSA PCR Screening     Status: None   Collection Time: 07/11/15  1:00 AM  Result Value Ref Range Status   MRSA by PCR NEGATIVE NEGATIVE Final    Comment:        The GeneXpert MRSA Assay (FDA approved for NASAL specimens only), is one component of a comprehensive MRSA colonization surveillance program. It is not intended to diagnose MRSA infection nor to guide or monitor treatment for MRSA infections.   Blood culture (routine x 2)     Status: None (Preliminary result)   Collection Time: 07/11/15  4:19 AM  Result Value Ref Range Status   Specimen Description BLOOD RIGHT FOREARM  Final   Special Requests BOTTLES  DRAWN AEROBIC ONLY 2CC  Final   Culture NO GROWTH 4 DAYS  Final   Report Status PENDING  Incomplete  Urine culture     Status: None (Preliminary result)   Collection Time: 07/11/15  5:09 PM  Result Value Ref Range Status   Specimen Description URINE, RANDOM  Final   Special Requests NONE  Final   Culture   Final    >=100,000 COLONIES/mL PSEUDOMONAS AERUGINOSA AMIKACIN MIC=8 SENSITIVE >=100,000 COLONIES/mL YEAST    Report Status PENDING  Incomplete   Organism ID, Bacteria PSEUDOMONAS AERUGINOSA  Final      Susceptibility   Pseudomonas aeruginosa - MIC*    CEFTAZIDIME 8 SENSITIVE Sensitive     CIPROFLOXACIN >=4 RESISTANT Resistant     GENTAMICIN >=16 RESISTANT Resistant     IMIPENEM >=16 RESISTANT Resistant     CEFEPIME 16 INTERMEDIATE Intermediate     * >=100,000 COLONIES/mL PSEUDOMONAS AERUGINOSA     Studies:  Recent x-ray studies have been reviewed in detail by the Attending Physician  Scheduled Meds:  Scheduled Meds: . antiseptic oral rinse  7 mL Mouth Rinse QID  . baclofen  10 mg Per Tube 2 times per day  . baclofen  20 mg Per Tube QHS  . chlorhexidine gluconate  15 mL Mouth Rinse BID  . collagenase   Topical Daily  . enoxaparin (LOVENOX) injection  30 mg Subcutaneous Daily  . fentaNYL  50 mcg Transdermal Q72H  . [START ON 07/16/2015] fosfomycin  3 g Oral Q48H  . imipenem-cilastatin  250 mg Intravenous 3 times per day  . Influenza vac split quadrivalent PF  0.5 mL Intramuscular Tomorrow-1000  . insulin aspart  0-20 Units Subcutaneous 6 times per day  . insulin glargine  24 Units Subcutaneous QHS  . midodrine  30 mg Per Tube TID WC  . multivitamin with minerals  1 tablet Per Tube Daily  . pantoprazole sodium  40 mg Per Tube Daily  . pregabalin  100 mg Per Tube 3 times per day  . pregabalin  200 mg Per Tube QHS  . vancomycin  500 mg Intravenous Q24H    Time spent on care of this patient: 40 mins   Angeliki Mates, Geraldo Docker , MD  [facility] Office   [phone] Pager [phone]  On-Call/Text Page:      [email or web address]      password [email or web address]  If 7PM-7AM, please contact night-coverage [email or web address] Password [email or web address] 07/15/2015, 8:29 PM   LOS: 5 days   Care during the described time interval was provided by me .  I have reviewed this patient's available data, including medical history, events of note, physical  examination, and all test results as part of my evaluation. I have personally reviewed and interpreted all radiology studies.   Dia Crawford, MD [phone] Pager

## 2015-07-15 NOTE — Progress Notes (Signed)
Patient refuses to go on ventilator at this time. Patient is resting comfortably on ATC at 28%. RT will continue to monitor.

## 2015-07-15 NOTE — Progress Notes (Signed)
Speech Pathology   MBSS complete. Full report located under chart review in imaging section. Double click on DG swallow function. Rec: Wear speaking valve with all meals, Dys 2, honey thick liquids, meds whole in applesauce, full supervision and assist, swallow 2 times after bites/sips.    Orbie Pyo Vincent.[employer] [phone]

## 2015-07-16 ENCOUNTER — Inpatient Hospital Stay (HOSPITAL_COMMUNITY): Payer: Medicaid Other

## 2015-07-16 DIAGNOSIS — Z1624 Resistance to multiple antibiotics: Secondary | ICD-10-CM

## 2015-07-16 DIAGNOSIS — A498 Other bacterial infections of unspecified site: Secondary | ICD-10-CM | POA: Diagnosis present

## 2015-07-16 DIAGNOSIS — N390 Urinary tract infection, site not specified: Secondary | ICD-10-CM | POA: Diagnosis present

## 2015-07-16 LAB — GLUCOSE, CAPILLARY
GLUCOSE-CAPILLARY: 107 mg/dL — AB (ref 65–99)
GLUCOSE-CAPILLARY: 114 mg/dL — AB (ref 65–99)
GLUCOSE-CAPILLARY: 122 mg/dL — AB (ref 65–99)
Glucose-Capillary: 127 mg/dL — ABNORMAL HIGH (ref 65–99)
Glucose-Capillary: 66 mg/dL (ref 65–99)
Glucose-Capillary: 31 mg/dL — CL (ref 65–99)
Glucose-Capillary: 51 mg/dL — ABNORMAL LOW (ref 65–99)
Glucose-Capillary: 153 mg/dL — ABNORMAL HIGH (ref 65–99)

## 2015-07-16 LAB — COMPREHENSIVE METABOLIC PANEL
ALT: 10 U/L — ABNORMAL LOW (ref 17–63)
ANION GAP: 7 (ref 5–15)
AST: 13 U/L — ABNORMAL LOW (ref 15–41)
Albumin: 1.6 g/dL — ABNORMAL LOW (ref 3.5–5.0)
Alkaline Phosphatase: 137 U/L — ABNORMAL HIGH (ref 38–126)
BILIRUBIN TOTAL: 0.4 mg/dL (ref 0.3–1.2)
BUN: 27 mg/dL — ABNORMAL HIGH (ref 6–20)
CALCIUM: 9.7 mg/dL (ref 8.9–10.3)
CO2: 21 mmol/L — ABNORMAL LOW (ref 22–32)
Chloride: 116 mmol/L — ABNORMAL HIGH (ref 101–111)
Creatinine, Ser: 0.52 mg/dL — ABNORMAL LOW (ref 0.61–1.24)
GLUCOSE: 106 mg/dL — AB (ref 65–99)
Potassium: 3.7 mmol/L (ref 3.5–5.1)
Sodium: 144 mmol/L (ref 135–145)
TOTAL PROTEIN: 6.5 g/dL (ref 6.5–8.1)

## 2015-07-16 LAB — CULTURE, BLOOD (ROUTINE X 2): CULTURE: NO GROWTH

## 2015-07-16 LAB — CBC WITH DIFFERENTIAL/PLATELET
Basophils Absolute: 0.1 10*3/uL (ref 0.0–0.1)
Basophils Relative: 0 %
Eosinophils Absolute: 0.5 10*3/uL (ref 0.0–0.7)
Eosinophils Relative: 3 %
HEMATOCRIT: 33.9 % — AB (ref 39.0–52.0)
HEMOGLOBIN: 10.7 g/dL — AB (ref 13.0–17.0)
LYMPHS ABS: 2.6 10*3/uL (ref 0.7–4.0)
Lymphocytes Relative: 13 %
MCH: 27.6 pg (ref 26.0–34.0)
MCHC: 31.6 g/dL (ref 30.0–36.0)
MCV: 87.4 fL (ref 78.0–100.0)
MONO ABS: 3.2 10*3/uL — AB (ref 0.1–1.0)
MONOS PCT: 16 %
NEUTROS ABS: 13.1 10*3/uL — AB (ref 1.7–7.7)
NEUTROS PCT: 68 %
Platelets: 506 10*3/uL — ABNORMAL HIGH (ref 150–400)
RBC: 3.88 MIL/uL — ABNORMAL LOW (ref 4.22–5.81)
RDW: 16.2 % — ABNORMAL HIGH (ref 11.5–15.5)
WBC: 19.4 10*3/uL — ABNORMAL HIGH (ref 4.0–10.5)

## 2015-07-16 LAB — MAGNESIUM: MAGNESIUM: 1.7 mg/dL (ref 1.7–2.4)

## 2015-07-16 MED ORDER — DEXTROSE 50 % IV SOLN
25.0000 mL | Freq: Once | INTRAVENOUS | Status: AC
  Administered 2015-07-16: 25 mL via INTRAVENOUS

## 2015-07-16 MED ORDER — VANCOMYCIN HCL 500 MG IV SOLR
500.0000 mg | INTRAVENOUS | Status: DC
  Administered 2015-07-17: 500 mg via INTRAVENOUS
  Filled 2015-07-16: qty 500

## 2015-07-16 MED ORDER — DEXTROSE 50 % IV SOLN
INTRAVENOUS | Status: AC
  Administered 2015-07-16: 25 mL
  Filled 2015-07-16: qty 50

## 2015-07-16 NOTE — Progress Notes (Addendum)
Hypoglycemic Event  CBG: 51  Treatment: D50 35ml  Symptoms: asymptomatic  Follow-up CBG: Time:1700 CBG Result:114  Possible Reasons for Event: unknown  Comments/MD notified: Text message sent to Dr. Johnnette Litter, Elsie Lincoln

## 2015-07-16 NOTE — Progress Notes (Signed)
Vent on hold patient on 28% ATC

## 2015-07-16 NOTE — Progress Notes (Addendum)
Hypoglycemic Event  CBG: 66  Treatment: Juice  Symptoms: asymptomatic  Follow-up CBG: Time: CBG Result:  Possible Reasons for Event:  Comments/MD notified:Dr. Johnnette Litter, Elsie Lincoln

## 2015-07-16 NOTE — Progress Notes (Signed)
Utilization Review Completed.  

## 2015-07-16 NOTE — Progress Notes (Signed)
Physical Therapy Wound Treatment Patient Details  Name: Jason [name] MRN: [id number] Date of Birth: Jul 21, 1986  Today's Date: 07/16/2015 Time: 0923-1010 Time Calculation (min): 47 min  Subjective  Subjective: "I'm alright" Patient and Family Stated Goals: None stated Date of Onset:  (Present on admission) Prior Treatments: Has received hydrotherapy on previous admission  Pain Score:   No value given. Reports feeling okay at the moment.  Wound Assessment        Pressure Ulcer 07/11/15 Stage IV - Full thickness tissue loss with exposed bone, tendon or muscle. (Active)  Dressing Type Tape dressing;ABD;Barrier Film (skin prep);Gauze (Comment);Moist to dry;Other (Comment) 07/16/2015 11:00 AM  Dressing Changed;Clean;Dry;Intact 07/16/2015 11:00 AM  Dressing Change Frequency Daily 07/16/2015 11:00 AM  State of Healing Early/partial granulation 07/16/2015 11:00 AM  Site / Wound Assessment Pink;Red;Yellow;Granulation tissue 07/16/2015 11:00 AM  % Wound base Red or Granulating 75% 07/16/2015 11:00 AM  % Wound base Yellow 25% 07/16/2015 11:00 AM  Peri-wound Assessment Intact 07/16/2015 11:00 AM  Wound Length (cm) 8.4 cm 07/13/2015  9:44 AM  Wound Width (cm) 7.3 cm 07/13/2015  9:44 AM  Wound Depth (cm) 2.7 cm 07/13/2015  9:44 AM  Undermining (cm) 4.2 cm from 12:00 to 1:00 07/13/2015  9:44 AM  Margins Unattached edges (unapproximated) 07/16/2015 11:00 AM  Drainage Amount Moderate 07/16/2015 11:00 AM  Drainage Description Serosanguineous;Odor 07/16/2015 11:00 AM  Treatment Cleansed;Hydrotherapy (Pulse lavage);Debridement (Selective);Packing (Saline gauze);Tape changed;Other (Comment) 07/16/2015 11:00 AM     Pressure Ulcer 07/11/15 Stage IV - Full thickness tissue loss with exposed bone, tendon or muscle. (Active)  Dressing Type ABD;Barrier Film (skin prep);Gauze (Comment);Moist to dry;Tape dressing 07/16/2015 11:00 AM  Dressing Changed;Clean;Dry;Intact 07/16/2015 11:00 AM  Dressing Change Frequency Daily  07/16/2015 11:00 AM  State of Healing Early/partial granulation 07/16/2015 11:00 AM  Site / Wound Assessment Granulation tissue;Pink;Yellow;Red;Brown 07/16/2015 11:00 AM  % Wound base Red or Granulating 75% 07/16/2015 11:00 AM  % Wound base Yellow 5% 07/16/2015 11:00 AM  % Wound base Black 10% 07/16/2015 11:00 AM  % Wound base Other (Comment) 10% 07/16/2015 11:00 AM  Peri-wound Assessment Pink 07/16/2015 11:00 AM  Wound Length (cm) 10.4 cm 07/14/2015 10:29 AM  Wound Width (cm) 12.4 cm 07/14/2015 10:29 AM  Wound Depth (cm) 2.9 cm 07/14/2015 10:29 AM  Undermining (cm) From 8:00 to 12:00 3.5 cm - From 12:00 to 3:00 4.0 cm 07/14/2015 10:29 AM  Margins Epibole (rolled edges) 07/16/2015 11:00 AM  Drainage Amount Moderate 07/16/2015 11:00 AM  Drainage Description Serosanguineous;Green;Odor 07/16/2015 11:00 AM  Treatment Cleansed;Debridement (Selective);Hydrotherapy (Pulse lavage);Packing (Saline gauze);Tape changed;Other (Comment) 07/16/2015 11:00 AM     Santyl applied to wound bed prior to applying dressing.  Hydrotherapy Pulsed lavage therapy - wound location: Lt ischial tuberosity and sacrum Pulsed Lavage with Suction (psi): 8 psi Pulsed Lavage with Suction - Normal Saline Used: 1000 mL Pulsed Lavage Tip: Tunneling tip Selective Debridement Selective Debridement - Location: Lt ischial tuberosity and Sacrum Selective Debridement - Tools Used: Scissors;Forceps Selective Debridement - Tissue Removed: Yellow slough, Brown/black eschar   Wound Assessment and Plan  Wound Therapy - Assess/Plan/Recommendations Wound Therapy - Clinical Statement: At 4:00 position on sacral wound, there appears to be more necrosis underneath brown eschar than originally thought. Have begun debriding this area more aggresively and continue with application of santyl. The granulatory tissue within the wound appears to be progressing nicely. Wound Therapy - Functional Problem List: Pt unable to tolerate prolonged sitting due to  pressure ulcers over sacrum and ischial tuberosities  Factors Delaying/Impairing Wound Healing: Incontinence;Infection - systemic/local;Polypharmacy;Diabetes Mellitus;Altered sensation;Immobility;Multiple medical problems Hydrotherapy Plan: Debridement;Dressing change;Patient/family education;Pulsatile lavage with suction Wound Therapy - Frequency: 6X / week Wound Therapy - Follow Up Recommendations: Other (comment) (LTACH vs SNF) Wound Plan: see above  Wound Therapy Goals- Improve the function of patient's integumentary system by progressing the wound(s) through the phases of wound healing (inflammation - proliferation - remodeling) by: Decrease Necrotic Tissue to: 10 Decrease Necrotic Tissue - Progress: Progressing toward goal Increase Granulation Tissue to: 90 Increase Granulation Tissue - Progress: Progressing toward goal Improve Drainage Characteristics: Min Improve Drainage Characteristics - Progress: Progressing toward goal  Goals will be updated until maximal potential achieved or discharge criteria met.  Discharge criteria: when goals achieved, discharge from hospital, MD decision/surgical intervention, no progress towards goals, refusal/missing three consecutive treatments without notification or medical reason.  GP     Candie Mile S 07/16/2015, 11:29 AM  Elayne Snare, [place]

## 2015-07-16 NOTE — Progress Notes (Signed)
ANTIBIOTIC CONSULT NOTE - INITIAL  Pharmacy Consult for Vancomycin/Primaxin  Indication: rule out sepsis  Allergies  Allergen Reactions  . Cefuroxime Axetil Anaphylaxis  . Ertapenem Other (See Comments)    Rash and confusion-->tolerated Imipenem   . Morphine And Related Other (See Comments)    Changed mental status, confusion, headache, visual hallucination  . Penicillins Anaphylaxis and Other (See Comments)    Tolerated Imipenem; no reaction to 7 day course of amoxicillin in 2015 Has patient had a PCN reaction causing immediate rash, facial/tongue/throat swelling, SOB or lightheadedness with hypotension: Yes Has patient had a PCN reaction causing severe rash involving mucus membranes or skin necrosis: No Has patient had a PCN reaction that required hospitalization Yes Has patient had a PCN reaction occurring within the last 10 years: No If all of the above answers are "NO", then may proceed with Cephalo  . Sulfa Antibiotics Anaphylaxis, Shortness Of Breath and Other (See Comments)  . Tessalon [Benzonatate] Anaphylaxis  . Shellfish Allergy Itching and Other (See Comments)    Took benadryl to alleviate reaction  . Nsaids Other (See Comments)    Risk of bleeding  . Miripirium Rash and Other (See Comments)    Change in mental status    Patient Measurements: ~45 kg  Vital Signs: Temp: 99.9 F (37.7 C) (10/08 0800) Temp Source: Oral (10/08 0349) BP: 118/75 mmHg (10/08 0349) Pulse Rate: 74 (10/08 0349)  Labs:  Recent Labs  07/14/15 0346 07/15/15 1034 07/16/15 0259  WBC 30.7* 25.8* 19.4*  HGB 11.9* 11.2* 10.7*  PLT 666* 534* 506*  CREATININE 0.77 0.64 0.52*   Estimated Creatinine Clearance: 94.6 mL/min (by C-G formula based on Cr of 0.52).  Medical History: Past Medical History  Diagnosis Date  . GERD (gastroesophageal reflux disease)   . Asthma   . Hx MRSA infection     on face  . Gastroparesis   . Diabetic neuropathy ([place])   . Seizures ([place])   . Family  history of anesthesia complication     Pt mother can't have epidural procedures  . Dysrhythmia   . Pneumonia   . Arthritis   . Fibromyalgia   . 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 Type I diabetes mellitus ([place])     sees Dr. Loanne Drilling   . Syncope 02/16/2015   Assessment: Day #6 of abx for sepsis d/t UTI and chronic osteo. Patient also has recurrent pelvic osteomyelitis, CT of the pelvis - negative for active osteomyelitis but has hx of MRSA in deep tissue so ID would like to continue vanc for now. Afebrile, WBC trending down to 19.4. Urine cx came back with Pseudomonas (sensitive to Ceftaz and Amikacin) ID ordered Fosfomycin x 3 doses which is estimated to have 75-80% effectiveness. Alternatively, could do monotherapy with aminoglycoside if fosfomycin dose not work. ID to assess wound with wound care to determine if needs another course of IV abx vs. chronic suppression for osteo  Noted: Pt was also recently (9/30) started doxycycline by ID as outpatient in case he had residual osteo.   Goal of Therapy:  Vancomycin trough level 15-20 mcg/ml  Plan: Continue Fosfomycin 3g PO Q48 x 3 doses per ID Continue Vancomycin 500 mg IV q24h Continue Primaxin to 250 mg IV q8 Check VT tonight Monitor clinical picture, renal function, VT prn F/U C&S, abx deescalation / LOT Follow up plans to continue abx for chronic osteo  Elenor Quinones, PharmD Clinical Pharmacist Pager [phone] 07/16/2015 10:17 AM

## 2015-07-16 NOTE — Progress Notes (Signed)
Vent on standby patient on 28% ATC

## 2015-07-16 NOTE — Progress Notes (Signed)
Patient on 28% ATC vent on standby.

## 2015-07-16 NOTE — Progress Notes (Signed)
[facility] TEAM 1 - Stepdown/ICU TEAM Progress Note  Jason [name] JIR:[id number] DOB: 1986-01-28 DOA: 07/10/2015 PCP: [name], MD  Admit HPI / Brief Narrative: Jason [name] is a 29 y.o. BM PMHx Spinal cord injury status post paraplegia who was recently admitted for MRSA and microaerophilic strep bacteremia with respiratory failure requiring trach and is the patient is on vent in the nights was brought to the ER after patient was found to have a hallucination and confusion as per patient's mother. Patient was recently tapered off his IV antibiotics and was placed on doxycycline and Cipro was started 3 days ago for possible UTI.   Over the last 3 days patient was noticed to have increasing confusion and hallucination as per patient's mother. In the ER patient UA shows features concerning for UTI and patient has chronic suprapubic catheter. Patient is also found to be hypothermic and was also hypotensive which improved with fluid bolus. Patient has had previous history of multiple debridement for chronic sacral decubitus which at this time looks healing. Patient also has history of chronic osteomyelitis. Patient has been admitted for possible developing sepsis. On call pulmonary critical care has been consulted and patient has been admitted for further management.  HPI/Subjective: 10/8  A/O 4, cognition significantly improved. Negative CP, negative SOB. Tolerating diet well Assessment/Plan: Toxic metabolic encephalopathy -Mental status at baseline  Sepsis due to UTI positive MDR Pseudomonas Aeruginosa(cause/exacerbated by chronic indwelling cath)  -UA convincing for UTI - WBC 19.8 + Temp <36 = sepsis - appears to be responding to IVF and abx  -Urine cultures are positive for Pseudomonas resistant to imipenem -Concerning abort patient's multidrug resistant organism consult infectious diseases - blood cultures pending -Now patient has been on several days of antibiotics replace his  suprapubic cath    Acute renal failure -Due to sepsis - improving w/ volume expansion   Hyponatremia  -likely simple due to hypovolemia  -Resolved   Diabetes mellitus type 1 with hyperglycemia -Well controlled - Increase Lantus 24 units daily -Increase to resistant SSI  Sacral decubitus/bilateral ischeal ulcers and left chest wall decubitus (osteomyelitis) -Wound team consulted -Hydrotherapy daily -After a few days of hydrotherapy will consult for wound Vac -Triadyne Bed maintain in rotational mode  Chronic anemia -Worsening secondary to hemodilution  -Transfuse for hemoglobin <7  -10/4 Transfuse 2 units PRBC  -Stable  Chronic respiratory failure with trach and vent -pulmonary critical care following and managing vent  -PCXR in a.m.  Hypomagnesemia -Magnesium IV 3 gm  History of spinal cord injury with paraplegia  Nutrition -Passed MBS today, dysphagia 2 diet honey thick liquid   Goals of care -Palliative care consult placed. Patient with spinal cord injury resulting in paraplegia, indwelling cath with multiple infections, nonhealing multiple decubitus ulcers to sacrum and bilateral buttocks resulting in frequent hospitalization; short-term vs long-term goals of care   Code Status: FULL Family Communication: None  Disposition Plan: SNF? Will await recommendations from palliative care    Consultants: PCCM - vent management   Procedure/Significant Events:    Culture 10/2 blood left negative 10/3 blood right forearm NGTD 10/3 urine positive MDR Pseudomonas Aeruginosa  10/3 MRSA by PCR negative  Antibiotics: Imipenem 10/2 > Vanc 10/2 >  DVT prophylaxis: lovenox    Devices    LINES / TUBES:      Continuous Infusions: . sodium chloride 50 mL/hr at 07/16/15 0251  . feeding supplement (VITAL AF 1.2 CAL) 1,000 mL (07/14/15 1546)    Objective: VITAL SIGNS:  Temp: 98.3 F (36.8 C) (10/08 0349) Temp Source: Oral (10/08 0349) BP: 118/75 mmHg  (10/08 0349) Pulse Rate: 74 (10/08 0349) SPO2; FIO2:   Intake/Output Summary (Last 24 hours) at 07/16/15 0845 Last data filed at 07/16/15 9741  Gross per 24 hour  Intake   2645 ml  Output   2675 ml  Net    -30 ml     Exam: General: A/O 4, NAD, No acute respiratory distress Eyes: Negative headache, eye pain, double vision,negative scleral hemorrhage ENT: Negative Runny nose, negative ear pain, negative gingival bleeding, Neck:  Negative scars, masses, torticollis, lymphadenopathy, JVD Lungs: Clear to auscultation bilaterally without wheezes or crackles Cardiovascular: Regular rate and rhythm without murmur gallop or rub normal S1 and S2 Abdomen:negative abdominal pain, nondistended, positive soft, bowel sounds, no rebound, no ascites, no appreciable mass, suprapubic catheter in place negative discharge or sign of infection Extremities: atrophy, large stage 4 sacral decubitus ulcer, bilateral ischeal stage IV decubitus ulcers, unstageable left heel and toe ulcer Psychiatric:  Negative depression, negative anxiety, negative fatigue, negative mania  Neurologic:  Cranial nerves II through XII intact, tongue/uvula midline, bilateral upper extremity gross muscles strength 2/5, bilateral lower extremity paralysis negative dysarthria, negative expressive aphasia, negative receptive aphasia.   Data Reviewed: Basic Metabolic Panel:  Recent Labs Lab 07/12/15 0310 07/13/15 0321 07/14/15 0346 07/15/15 1034 07/16/15 0259  NA 133* 133* 133* 141 144  K 4.9 5.0 4.4 4.4 3.7  CL 112* 113* 108 116* 116*  CO2 14* 14* 16* 17* 21*  GLUCOSE 90 254* 309* 203* 106*  BUN 60* 51* 45* 35* 27*  CREATININE 1.05 0.89 0.77 0.64 0.52*  CALCIUM 9.1 8.9 9.1 9.5 9.7  MG  --   --   --  1.3* 1.7   Liver Function Tests:  Recent Labs Lab 07/12/15 0310 07/13/15 0321 07/14/15 0346 07/15/15 1034 07/16/15 0259  AST 18 18 17  12* 13*  ALT 13* 11* 13* 9* 10*  ALKPHOS 127* 146* 159* 132* 137*  BILITOT 0.3  0.4 0.4 0.3 0.4  PROT 6.5 6.9 6.7 6.5 6.5  ALBUMIN 1.8* 1.8* 1.8* 1.6* 1.6*   No results for input(s): LIPASE, AMYLASE in the last 168 hours. No results for input(s): AMMONIA in the last 168 hours. CBC:  Recent Labs Lab 07/10/15 1633  07/11/15 0440 07/12/15 0310 07/13/15 0321 07/14/15 0346 07/15/15 1034 07/16/15 0259  WBC 19.8*  --  15.0* 16.4* 20.3* 30.7* 25.8* 19.4*  NEUTROABS 14.0*  --  13.1*  --   --   --  20.1* 13.1*  HGB 8.7*  < > 7.9* 7.1* 11.1* 11.9* 11.2* 10.7*  HCT 26.9*  < > 25.2* 22.5* 33.8* 35.8* 35.0* 33.9*  MCV 84.3  --  83.4 83.0 84.5 85.9 86.4 87.4  PLT 787*  --  669* 614* 630* 666* 534* 506*  < > = values in this interval not displayed. Cardiac Enzymes: No results for input(s): CKTOTAL, CKMB, CKMBINDEX, TROPONINI in the last 168 hours. BNP (last 3 results) No results for input(s): BNP in the last 8760 hours.  ProBNP (last 3 results) No results for input(s): PROBNP in the last 8760 hours.  CBG:  Recent Labs Lab 07/15/15 1209 07/15/15 1626 07/15/15 2000 07/16/15 0035 07/16/15 0350  GLUCAP 189* 199* 124* 107* 127*    Recent Results (from the past 240 hour(s))  Blood culture (routine x 2)     Status: None   Collection Time: 07/10/15  4:25 PM  Result Value Ref Range Status  Specimen Description BLOOD LEFT ARM  Final   Special Requests BOTTLES DRAWN AEROBIC AND ANAEROBIC 5CC  Final   Culture NO GROWTH 5 DAYS  Final   Report Status 07/15/2015 FINAL  Final  MRSA PCR Screening     Status: None   Collection Time: 07/11/15  1:00 AM  Result Value Ref Range Status   MRSA by PCR NEGATIVE NEGATIVE Final    Comment:        The GeneXpert MRSA Assay (FDA approved for NASAL specimens only), is one component of a comprehensive MRSA colonization surveillance program. It is not intended to diagnose MRSA infection nor to guide or monitor treatment for MRSA infections.   Blood culture (routine x 2)     Status: None (Preliminary result)   Collection Time:  07/11/15  4:19 AM  Result Value Ref Range Status   Specimen Description BLOOD RIGHT FOREARM  Final   Special Requests BOTTLES DRAWN AEROBIC ONLY 2CC  Final   Culture NO GROWTH 4 DAYS  Final   Report Status PENDING  Incomplete  Urine culture     Status: None (Preliminary result)   Collection Time: 07/11/15  5:09 PM  Result Value Ref Range Status   Specimen Description URINE, RANDOM  Final   Special Requests NONE  Final   Culture   Final    >=100,000 COLONIES/mL PSEUDOMONAS AERUGINOSA AMIKACIN MIC=8 SENSITIVE >=100,000 COLONIES/mL YEAST    Report Status PENDING  Incomplete   Organism ID, Bacteria PSEUDOMONAS AERUGINOSA  Final      Susceptibility   Pseudomonas aeruginosa - MIC*    CEFTAZIDIME 8 SENSITIVE Sensitive     CIPROFLOXACIN >=4 RESISTANT Resistant     GENTAMICIN >=16 RESISTANT Resistant     IMIPENEM >=16 RESISTANT Resistant     CEFEPIME 16 INTERMEDIATE Intermediate     * >=100,000 COLONIES/mL PSEUDOMONAS AERUGINOSA     Studies:  Recent x-ray studies have been reviewed in detail by the Attending Physician  Scheduled Meds:  Scheduled Meds: . antiseptic oral rinse  7 mL Mouth Rinse QID  . baclofen  10 mg Per Tube 2 times per day  . baclofen  20 mg Per Tube QHS  . chlorhexidine gluconate  15 mL Mouth Rinse BID  . collagenase   Topical Daily  . enoxaparin (LOVENOX) injection  30 mg Subcutaneous Daily  . fentaNYL  50 mcg Transdermal Q72H  . fosfomycin  3 g Oral Q48H  . imipenem-cilastatin  250 mg Intravenous 3 times per day  . Influenza vac split quadrivalent PF  0.5 mL Intramuscular Tomorrow-1000  . insulin aspart  0-20 Units Subcutaneous 6 times per day  . insulin glargine  24 Units Subcutaneous QHS  . midodrine  30 mg Per Tube TID WC  . multivitamin with minerals  1 tablet Per Tube Daily  . pantoprazole sodium  40 mg Per Tube Daily  . pregabalin  100 mg Per Tube 3 times per day  . pregabalin  200 mg Per Tube QHS  . vancomycin  500 mg Intravenous Q24H    Time  spent on care of this patient: 15 mins   Hooper Petteway , MD  [facility] Office  [phone] Pager - [phone]  On-Call/Text Page:      [email or web address]      password [email or web address]  If 7PM-7AM, please contact night-coverage [email or web address] Password [email or web address] 07/16/2015, 8:45 AM   LOS: 6 days   Care during the described time interval was provided by me .  I have reviewed this patient's available  data, including medical history, events of note, physical examination, and all test results as part of my evaluation. I have personally reviewed and interpreted all radiology studies.

## 2015-07-16 NOTE — Progress Notes (Signed)
Hypoglycemic Event  CBG:31  Treatment: D50 31ml  Symptoms: asymptomatic  Follow-up CBG: Time: CBG Result:  Possible Reasons for Event:   Comments/MD notified:MD is aware    Jason [name]

## 2015-07-16 NOTE — Progress Notes (Signed)
SLP Cancellation Note  Patient Details Name: Jason [name] MRN: [id number] DOB: August 04, 1986   Cancelled treatment:       Reason Eval/Treat Not Completed: Patient at procedure or test/unavailable   ADAMS,PAT, M.S., CCC-SLP 07/16/2015, 3:56 PM

## 2015-07-17 LAB — VANCOMYCIN, TROUGH: VANCOMYCIN TR: 12 ug/mL (ref 10.0–20.0)

## 2015-07-17 LAB — CBC WITH DIFFERENTIAL/PLATELET
BASOS ABS: 0.1 10*3/uL (ref 0.0–0.1)
BASOS PCT: 0 %
EOS PCT: 2 %
Eosinophils Absolute: 0.5 10*3/uL (ref 0.0–0.7)
HEMATOCRIT: 35.6 % — AB (ref 39.0–52.0)
Hemoglobin: 11.4 g/dL — ABNORMAL LOW (ref 13.0–17.0)
LYMPHS PCT: 10 %
Lymphs Abs: 2.5 10*3/uL (ref 0.7–4.0)
MCH: 27.9 pg (ref 26.0–34.0)
MCHC: 32 g/dL (ref 30.0–36.0)
MCV: 87 fL (ref 78.0–100.0)
MONO ABS: 4.2 10*3/uL — AB (ref 0.1–1.0)
Monocytes Relative: 17 %
NEUTROS ABS: 17.6 10*3/uL — AB (ref 1.7–7.7)
Neutrophils Relative %: 71 %
PLATELETS: 534 10*3/uL — AB (ref 150–400)
RBC: 4.09 MIL/uL — AB (ref 4.22–5.81)
RDW: 16 % — AB (ref 11.5–15.5)
WBC: 24.9 10*3/uL — AB (ref 4.0–10.5)

## 2015-07-17 LAB — COMPREHENSIVE METABOLIC PANEL
ALBUMIN: 1.7 g/dL — AB (ref 3.5–5.0)
ALT: 13 U/L — AB (ref 17–63)
AST: 24 U/L (ref 15–41)
Alkaline Phosphatase: 128 U/L — ABNORMAL HIGH (ref 38–126)
Anion gap: 10 (ref 5–15)
BUN: 21 mg/dL — AB (ref 6–20)
CHLORIDE: 112 mmol/L — AB (ref 101–111)
CO2: 19 mmol/L — AB (ref 22–32)
CREATININE: 0.38 mg/dL — AB (ref 0.61–1.24)
Calcium: 9.5 mg/dL (ref 8.9–10.3)
GFR calc Af Amer: >60 mL/min (ref >60)
GFR calc non Af Amer: >60 mL/min (ref >60)
GLUCOSE: 89 mg/dL (ref 65–99)
POTASSIUM: 3.8 mmol/L (ref 3.5–5.1)
Sodium: 141 mmol/L (ref 135–145)
Total Bilirubin: 0.2 mg/dL — ABNORMAL LOW (ref 0.3–1.2)
Total Protein: 6.9 g/dL (ref 6.5–8.1)

## 2015-07-17 LAB — GLUCOSE, CAPILLARY
Glucose-Capillary: 90 mg/dL (ref 65–99)
Glucose-Capillary: 97 mg/dL (ref 65–99)
Glucose-Capillary: 100 mg/dL — ABNORMAL HIGH (ref 65–99)
Glucose-Capillary: 224 mg/dL — ABNORMAL HIGH (ref 65–99)

## 2015-07-17 LAB — MAGNESIUM: Magnesium: 1.4 mg/dL — ABNORMAL LOW (ref 1.7–2.4)

## 2015-07-17 MED ORDER — INSULIN ASPART 100 UNIT/ML ~~LOC~~ SOLN
0.0000 [IU] | Freq: Three times a day (TID) | SUBCUTANEOUS | Status: DC
  Administered 2015-07-18 (×2): 15 [IU] via SUBCUTANEOUS

## 2015-07-17 MED ORDER — VANCOMYCIN HCL IN DEXTROSE 750-5 MG/150ML-% IV SOLN
750.0000 mg | INTRAVENOUS | Status: AC
  Administered 2015-07-18 – 2015-07-19 (×2): 750 mg via INTRAVENOUS
  Filled 2015-07-17 (×3): qty 150

## 2015-07-17 NOTE — Progress Notes (Signed)
[facility] TEAM 1 - Stepdown/ICU TEAM Progress Note  Jason [name] JWJ:[id number] DOB: Dec 26, 1985 DOA: 07/10/2015 PCP: [name], MD  Admit HPI / Brief Narrative:  29 y.o? PMHx Spinal cord injury status post paraplegia who   recently admitted for MRSA and microaerophilic strep bacteremia with respiratory failure requiring trach  and is the patient is on vent in the nights was brought to the ER after patient was found to have a hallucination and confusion as per patient's mother.   Patient was recently tapered off his IV antibiotics and was placed on doxycycline and Cipro was started 3 days ago for possible UTI.   Met sirs crit with hypothermia/hypotension and source suspected eithe rchr Foley [suprapubic] vs chr Sacral decubiti  HPI/Subjective:   Assessment/Plan: Toxic metabolic encephalopathy -Mental status at baseline  Sepsis due to UTI positive MDR Pseudomonas Aeruginosa(cause/exacerbated by chronic indwelling cath)  -UA convincing for UTI - WBC 19.8 + Temp <36 = sepsis - appears to be responding to IVF and abx  -Urine cultures are positive for Pseudomonas resistant to imipenem, ID recommended 3 doses Fosfomycin and continued Van and Imipenem suprapubic cath  Acute renal failure -Due to sepsis - improving daily  w/ volume expansion  -continue saline 50 cc/hr for now  Hyponatremia  -likely 2/2 hypovolemia  -Resolved   Diabetes mellitus type 1 with hyperglycemia -too tighlty controlled, low blood sugars in the 30-50 range overnight 10/8-10/9 am after increasing doses of insulin - d/c Lantus 24 units -on 07/17/15 -switch to moderate scale 10./9 -reassess need for long acting in am  Sacral decubitus/bilateral ischeal ulcers and left chest wall decubitus (osteomyelitis) -Wound team consulted -Hydrotherapy daily -conmsider hydrotherapy will consult for wound Vac -Triadyne Bed maintain in rotational mode  Chronic anemia -Worsening secondary to hemodilution    -Transfuse for hemoglobin <7  -10/4 Transfuse 2 units PRBC  -Stable  Chronic respiratory failure with trach and vent -pulmonary critical care following and managing vent  -PCXR in a.m. 07/18/15 for comparison to 10/8  Hypomagnesemia -Magnesium IV 3 gm -resolved  History of spinal cord injury with paraplegia  Nutrition -Passed MBS today, dysphagia 2 diet honey thick liquid -Tolerating dieat and using passey muir   Goals of care -Palliative care consulted and note patient wishes full scope of therapies   Code Status: FULL Family Communication: None  Disposition Plan: SNF soon-will probably need HT as wellhe gets 16-21 hr/day of home ehalth so maybe can also eventually go home once decided on abx if no further PT Hydrotherapy needs   Consultants: PCCM - vent management   Procedure/Significant Events:    Culture 10/2 blood left negative 10/3 blood right forearm NGTD 10/3 urine positive MDR Pseudomonas Aeruginosa  10/3 MRSA by PCR negative  Antibiotics: Imipenem 10/2 > Vanc 10/2 >  DVT prophylaxis: lovenox    Devices    LINES / TUBES:      Continuous Infusions: . sodium chloride 50 mL/hr at 07/17/15 0200  . feeding supplement (VITAL AF 1.2 CAL) 1,000 mL (07/17/15 1000)    Objective: VITAL SIGNS: Temp: 98.2 F (36.8 C) (10/09 1118) Temp Source: Oral (10/09 1118) BP: 109/76 mmHg (10/09 1118) Pulse Rate: 72 (10/09 1118) SPO2; FIO2:   Intake/Output Summary (Last 24 hours) at 07/17/15 1517 Last data filed at 07/17/15 1400  Gross per 24 hour  Intake   2295 ml  Output   4025 ml  Net  -1730 ml     Exam:  Alert conversant oriente din nad eomi ncat  power intact ue's, LE's not tested PEG in place, Colostomy area clean  Data Reviewed: Basic Metabolic Panel:  Recent Labs Lab 07/13/15 0321 07/14/15 0346 07/15/15 1034 07/16/15 0259 07/17/15 0111  NA 133* 133* 141 144 141  K 5.0 4.4 4.4 3.7 3.8  CL 113* 108 116* 116* 112*  CO2 14* 16*  17* 21* 19*  GLUCOSE 254* 309* 203* 106* 89  BUN 51* 45* 35* 27* 21*  CREATININE 0.89 0.77 0.64 0.52* 0.38*  CALCIUM 8.9 9.1 9.5 9.7 9.5  MG  --   --  1.3* 1.7 1.4*   Liver Function Tests:  Recent Labs Lab 07/13/15 0321 07/14/15 0346 07/15/15 1034 07/16/15 0259 07/17/15 0111  AST 18 17 12* 13* 24  ALT 11* 13* 9* 10* 13*  ALKPHOS 146* 159* 132* 137* 128*  BILITOT 0.4 0.4 0.3 0.4 0.2*  PROT 6.9 6.7 6.5 6.5 6.9  ALBUMIN 1.8* 1.8* 1.6* 1.6* 1.7*   No results for input(s): LIPASE, AMYLASE in the last 168 hours. No results for input(s): AMMONIA in the last 168 hours. CBC:  Recent Labs Lab 07/10/15 1633  07/11/15 0440  07/13/15 0321 07/14/15 0346 07/15/15 1034 07/16/15 0259 07/17/15 0111  WBC 19.8*  --  15.0*  < > 20.3* 30.7* 25.8* 19.4* 24.9*  NEUTROABS 14.0*  --  13.1*  --   --   --  20.1* 13.1* 17.6*  HGB 8.7*  < > 7.9*  < > 11.1* 11.9* 11.2* 10.7* 11.4*  HCT 26.9*  < > 25.2*  < > 33.8* 35.8* 35.0* 33.9* 35.6*  MCV 84.3  --  83.4  < > 84.5 85.9 86.4 87.4 87.0  PLT 787*  --  669*  < > 630* 666* 534* 506* 534*  < > = values in this interval not displayed. Cardiac Enzymes: No results for input(s): CKTOTAL, CKMB, CKMBINDEX, TROPONINI in the last 168 hours. BNP (last 3 results) No results for input(s): BNP in the last 8760 hours.  ProBNP (last 3 results) No results for input(s): PROBNP in the last 8760 hours.  CBG:  Recent Labs Lab 07/16/15 1746 07/16/15 1954 07/16/15 2306 07/17/15 0429 07/17/15 0756  GLUCAP 114* 153* 122* 90 97    Recent Results (from the past 240 hour(s))  Blood culture (routine x 2)     Status: None   Collection Time: 07/10/15  4:25 PM  Result Value Ref Range Status   Specimen Description BLOOD LEFT ARM  Final   Special Requests BOTTLES DRAWN AEROBIC AND ANAEROBIC 5CC  Final   Culture NO GROWTH 5 DAYS  Final   Report Status 07/15/2015 FINAL  Final  MRSA PCR Screening     Status: None   Collection Time: 07/11/15  1:00 AM  Result Value  Ref Range Status   MRSA by PCR NEGATIVE NEGATIVE Final    Comment:        The GeneXpert MRSA Assay (FDA approved for NASAL specimens only), is one component of a comprehensive MRSA colonization surveillance program. It is not intended to diagnose MRSA infection nor to guide or monitor treatment for MRSA infections.   Blood culture (routine x 2)     Status: None   Collection Time: 07/11/15  4:19 AM  Result Value Ref Range Status   Specimen Description BLOOD RIGHT FOREARM  Final   Special Requests BOTTLES DRAWN AEROBIC ONLY 2CC  Final   Culture NO GROWTH 5 DAYS  Final   Report Status 07/16/2015 FINAL  Final  Urine culture  Status: None (Preliminary result)   Collection Time: 07/11/15  5:09 PM  Result Value Ref Range Status   Specimen Description URINE, RANDOM  Final   Special Requests NONE  Final   Culture   Final    >=100,000 COLONIES/mL PSEUDOMONAS AERUGINOSA AMIKACIN MIC=8 SENSITIVE AZTREONAM SENSITIVITIES TO FOLLOW >=100,000 COLONIES/mL YEAST    Report Status PENDING  Incomplete   Organism ID, Bacteria PSEUDOMONAS AERUGINOSA  Final      Susceptibility   Pseudomonas aeruginosa - MIC*    CEFTAZIDIME 8 SENSITIVE Sensitive     CIPROFLOXACIN >=4 RESISTANT Resistant     GENTAMICIN >=16 RESISTANT Resistant     IMIPENEM >=16 RESISTANT Resistant     CEFEPIME 16 INTERMEDIATE Intermediate     * >=100,000 COLONIES/mL PSEUDOMONAS AERUGINOSA     Studies:  Recent x-ray studies have been reviewed in detail by the Attending Physician  Scheduled Meds:  Scheduled Meds: . antiseptic oral rinse  7 mL Mouth Rinse QID  . baclofen  10 mg Per Tube 2 times per day  . baclofen  20 mg Per Tube QHS  . chlorhexidine gluconate  15 mL Mouth Rinse BID  . collagenase   Topical Daily  . enoxaparin (LOVENOX) injection  30 mg Subcutaneous Daily  . fentaNYL  50 mcg Transdermal Q72H  . fosfomycin  3 g Oral Q48H  . imipenem-cilastatin  250 mg Intravenous 3 times per day  . Influenza vac split  quadrivalent PF  0.5 mL Intramuscular Tomorrow-1000  . insulin aspart  0-20 Units Subcutaneous 6 times per day  . midodrine  30 mg Per Tube TID WC  . multivitamin with minerals  1 tablet Per Tube Daily  . pantoprazole sodium  40 mg Per Tube Daily  . pregabalin  100 mg Per Tube 3 times per day  . pregabalin  200 mg Per Tube QHS  . [START ON 07/18/2015] vancomycin  750 mg Intravenous Q24H    Time spent on care of this patient: 40 mins   Nita Sells , MD  [facility] Office  [phone] Pager - [phone]  On-Call/Text Page:      [email or web address]      password [email or web address]  If 7PM-7AM, please contact night-coverage [email or web address] Password [email or web address] 07/17/2015, 3:17 PM   LOS: 7 days   Care during the described time interval was provided by me .  I have reviewed this patient's available data, including medical history, events of note, physical examination, and all test results as part of my evaluation. I have personally reviewed and interpreted all radiology studies.

## 2015-07-17 NOTE — Progress Notes (Signed)
ANTIBIOTIC CONSULT NOTE - Follow-up  Pharmacy Consult for Vancomycin/Primaxin  Indication: rule out sepsis  Allergies  Allergen Reactions  . Cefuroxime Axetil Anaphylaxis  . Ertapenem Other (See Comments)    Rash and confusion-->tolerated Imipenem   . Morphine And Related Other (See Comments)    Changed mental status, confusion, headache, visual hallucination  . Penicillins Anaphylaxis and Other (See Comments)    Tolerated Imipenem; no reaction to 7 day course of amoxicillin in 2015 Has patient had a PCN reaction causing immediate rash, facial/tongue/throat swelling, SOB or lightheadedness with hypotension: Yes Has patient had a PCN reaction causing severe rash involving mucus membranes or skin necrosis: No Has patient had a PCN reaction that required hospitalization Yes Has patient had a PCN reaction occurring within the last 10 years: No If all of the above answers are "NO", then may proceed with Cephalo  . Sulfa Antibiotics Anaphylaxis, Shortness Of Breath and Other (See Comments)  . Tessalon [Benzonatate] Anaphylaxis  . Shellfish Allergy Itching and Other (See Comments)    Took benadryl to alleviate reaction  . Nsaids Other (See Comments)    Risk of bleeding  . Miripirium Rash and Other (See Comments)    Change in mental status    Patient Measurements: ~45 kg  Vital Signs: Temp: 98.1 F (36.7 C) (10/09 0430) Temp Source: Oral (10/09 0430) BP: 99/76 mmHg (10/09 0430) Pulse Rate: 73 (10/09 0430)  Labs:  Recent Labs  07/15/15 1034 07/16/15 0259 07/17/15 0111  WBC 25.8* 19.4* 24.9*  HGB 11.2* 10.7* 11.4*  PLT 534* 506* 534*  CREATININE 0.64 0.52* 0.38*   Estimated Creatinine Clearance: 94.6 mL/min (by C-G formula based on Cr of 0.38).  Assessment: Day #7 of abx for sepsis d/t UTI and chronic osteo. Patient also has recurrent pelvic osteomyelitis, CT of the pelvis - negative for active osteomyelitis but has hx of MRSA in deep tissue so ID would like to continue  vanc for now. Afebrile, WBC 24.9. Scr is trending down. Urine cx came back with Pseudomonas (sensitive to Ceftaz and Amikacin) ID ordered Fosfomycin x 3 doses which is estimated to have 75-80% effectiveness. A vancomycin trough was drawn and is slightly low at 12.   Noted: Pt was also recently (9/30) started doxycycline by ID as outpatient in case he had residual osteo.   Goal of Therapy:  Vancomycin trough level 15-20 mcg/ml  Plan: - Change vanc to 750mg  IV Q24H - F/u renal fxn, C&S, clinical status and trough at [facility], PharmD, BCPS Pager # [phone] 07/17/2015 5:11 AM

## 2015-07-18 LAB — COMPREHENSIVE METABOLIC PANEL
ALK PHOS: 122 U/L (ref 38–126)
ALT: 11 U/L — AB (ref 17–63)
ANION GAP: 8 (ref 5–15)
AST: 17 U/L (ref 15–41)
Albumin: 1.7 g/dL — ABNORMAL LOW (ref 3.5–5.0)
BILIRUBIN TOTAL: 0.8 mg/dL (ref 0.3–1.2)
BUN: 19 mg/dL (ref 6–20)
CALCIUM: 9.3 mg/dL (ref 8.9–10.3)
CO2: 20 mmol/L — ABNORMAL LOW (ref 22–32)
CREATININE: 0.52 mg/dL — AB (ref 0.61–1.24)
Chloride: 108 mmol/L (ref 101–111)
Glucose, Bld: 438 mg/dL — ABNORMAL HIGH (ref 65–99)
Potassium: 4.7 mmol/L (ref 3.5–5.1)
SODIUM: 136 mmol/L (ref 135–145)
TOTAL PROTEIN: 6.3 g/dL — AB (ref 6.5–8.1)

## 2015-07-18 LAB — CBC WITH DIFFERENTIAL/PLATELET
Basophils Absolute: 0.1 10*3/uL (ref 0.0–0.1)
Basophils Relative: 1 %
EOS ABS: 0.4 10*3/uL (ref 0.0–0.7)
Eosinophils Relative: 1 %
HEMATOCRIT: 34.9 % — AB (ref 39.0–52.0)
HEMOGLOBIN: 11.2 g/dL — AB (ref 13.0–17.0)
LYMPHS ABS: 2.4 10*3/uL (ref 0.7–4.0)
LYMPHS PCT: 10 %
MCH: 28.4 pg (ref 26.0–34.0)
MCHC: 32.1 g/dL (ref 30.0–36.0)
MCV: 88.6 fL (ref 78.0–100.0)
MONOS PCT: 11 %
Monocytes Absolute: 2.6 10*3/uL — ABNORMAL HIGH (ref 0.1–1.0)
NEUTROS PCT: 77 %
Neutro Abs: 18.9 10*3/uL — ABNORMAL HIGH (ref 1.7–7.7)
Platelets: 394 10*3/uL (ref 150–400)
RBC: 3.94 MIL/uL — AB (ref 4.22–5.81)
RDW: 16.2 % — ABNORMAL HIGH (ref 11.5–15.5)
WBC: 24.4 10*3/uL — ABNORMAL HIGH (ref 4.0–10.5)

## 2015-07-18 LAB — GLUCOSE, CAPILLARY
GLUCOSE-CAPILLARY: 565 mg/dL — AB (ref 65–99)
GLUCOSE-CAPILLARY: 492 mg/dL — AB (ref 65–99)
GLUCOSE-CAPILLARY: 365 mg/dL — AB (ref 65–99)
GLUCOSE-CAPILLARY: 227 mg/dL — AB (ref 65–99)

## 2015-07-18 LAB — MAGNESIUM: MAGNESIUM: 1.2 mg/dL — AB (ref 1.7–2.4)

## 2015-07-18 MED ORDER — VITAL AF 1.2 CAL PO LIQD
1000.0000 mL | ORAL | Status: DC
  Filled 2015-07-18: qty 1000

## 2015-07-18 MED ORDER — INSULIN GLARGINE 100 UNIT/ML ~~LOC~~ SOLN
12.0000 [IU] | Freq: Every day | SUBCUTANEOUS | Status: DC
  Administered 2015-07-18 – 2015-07-19 (×2): 12 [IU] via SUBCUTANEOUS
  Filled 2015-07-18 (×2): qty 0.12

## 2015-07-18 MED ORDER — INSULIN ASPART 100 UNIT/ML ~~LOC~~ SOLN
0.0000 [IU] | SUBCUTANEOUS | Status: DC
  Administered 2015-07-18: 11 [IU] via SUBCUTANEOUS
  Administered 2015-07-18: 5 [IU] via SUBCUTANEOUS
  Administered 2015-07-19: 2 [IU] via SUBCUTANEOUS
  Administered 2015-07-19 (×2): 5 [IU] via SUBCUTANEOUS
  Administered 2015-07-19 – 2015-07-20 (×3): 3 [IU] via SUBCUTANEOUS
  Administered 2015-07-20: 5 [IU] via SUBCUTANEOUS
  Administered 2015-07-20 – 2015-07-21 (×5): 3 [IU] via SUBCUTANEOUS
  Administered 2015-07-21: 8 [IU] via SUBCUTANEOUS
  Administered 2015-07-21 (×2): 3 [IU] via SUBCUTANEOUS
  Administered 2015-07-22 (×2): 5 [IU] via SUBCUTANEOUS
  Administered 2015-07-22: 8 [IU] via SUBCUTANEOUS
  Administered 2015-07-22: 3 [IU] via SUBCUTANEOUS
  Administered 2015-07-22: 11 [IU] via SUBCUTANEOUS
  Administered 2015-07-22 (×2): 5 [IU] via SUBCUTANEOUS
  Administered 2015-07-23: 8 [IU] via SUBCUTANEOUS
  Administered 2015-07-23: 5 [IU] via SUBCUTANEOUS
  Administered 2015-07-23 (×2): 2 [IU] via SUBCUTANEOUS
  Administered 2015-07-23: 11 [IU] via SUBCUTANEOUS
  Administered 2015-07-24: 2 [IU] via SUBCUTANEOUS
  Administered 2015-07-24: 11 [IU] via SUBCUTANEOUS
  Administered 2015-07-24: 5 [IU] via SUBCUTANEOUS
  Administered 2015-07-24 – 2015-07-25 (×2): 8 [IU] via SUBCUTANEOUS
  Administered 2015-07-25: 2 [IU] via SUBCUTANEOUS
  Administered 2015-07-25: 5 [IU] via SUBCUTANEOUS
  Administered 2015-07-25: 8 [IU] via SUBCUTANEOUS
  Administered 2015-07-26: 5 [IU] via SUBCUTANEOUS
  Administered 2015-07-26 (×2): 8 [IU] via SUBCUTANEOUS
  Administered 2015-07-26: 3 [IU] via SUBCUTANEOUS
  Administered 2015-07-27: 5 [IU] via SUBCUTANEOUS
  Administered 2015-07-27 (×2): 3 [IU] via SUBCUTANEOUS
  Administered 2015-07-28 (×3): 5 [IU] via SUBCUTANEOUS
  Administered 2015-07-28: 8 [IU] via SUBCUTANEOUS
  Administered 2015-07-28: 3 [IU] via SUBCUTANEOUS
  Administered 2015-07-28: 5 [IU] via SUBCUTANEOUS
  Administered 2015-07-29 (×2): 3 [IU] via SUBCUTANEOUS
  Administered 2015-07-29: 2 [IU] via SUBCUTANEOUS
  Administered 2015-07-29: 5 [IU] via SUBCUTANEOUS
  Administered 2015-07-29: 2 [IU] via SUBCUTANEOUS
  Administered 2015-07-29: 3 [IU] via SUBCUTANEOUS
  Administered 2015-07-30: 2 [IU] via SUBCUTANEOUS
  Administered 2015-07-30 (×2): 5 [IU] via SUBCUTANEOUS
  Administered 2015-07-30: 3 [IU] via SUBCUTANEOUS
  Administered 2015-07-30 (×2): 2 [IU] via SUBCUTANEOUS
  Administered 2015-07-31: 3 [IU] via SUBCUTANEOUS
  Administered 2015-07-31: 8 [IU] via SUBCUTANEOUS
  Administered 2015-07-31 – 2015-08-01 (×5): 3 [IU] via SUBCUTANEOUS
  Administered 2015-08-01: 5 [IU] via SUBCUTANEOUS
  Administered 2015-08-01: 3 [IU] via SUBCUTANEOUS

## 2015-07-18 MED ORDER — VITAL AF 1.2 CAL PO LIQD
1000.0000 mL | ORAL | Status: DC
  Administered 2015-07-18 – 2015-07-21 (×4): 1000 mL
  Filled 2015-07-18: qty 237
  Filled 2015-07-18 (×5): qty 1000

## 2015-07-18 NOTE — Progress Notes (Signed)
[facility] TEAM 1 - Stepdown/ICU TEAM PROGRESS NOTE  EAMON TANTILLO DEY:[id number] DOB: 1985/10/12 DOA: 07/10/2015 PCP: [name], MD  Admit HPI / Brief Narrative: 29 y.o. 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 Patient was recently tapered off his IV antibiotics and was placed on doxycycline and Cipro 3 days prior to this admit for possible UTI.   In the ER patient UA shows features concerning for UTI - patient has chronic suprapubic catheter. Patient was also found to be hypothermic and hypotensive. Patient has had previous history of multiple debridement for chronic sacral decubitus which at this time looks to be healing. Patient also has history of chronic osteomyelitis.   HPI/Subjective: The patient is awake and alert and looks significantly improved compared to the last time that I saw him.  He is complaining about his modified diet and wishes to be placed on a regular diet with thin liquids.  He denies chest pain shortness of breath fevers chills or abdominal pain.  Assessment/Plan:  Toxic metabolic encephalopathy Resolved  Sepsis due to MDR Pseudomonas Aeruginosa UTI  Status post third dose of fosfomycin today per ID recommendations   Acute renal failure Due to sepsis - renal function has now normalized  Hyponatremia  due to hypovolemia - resolved w/ volume resuscitation    Diabetes mellitus type 1 with hyperglycemia CBGs are now very poorly controlled  - adjust treatment and follow closely   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 cont hydrotherapy to wounds - antibiotic coverage for possible superinfection per ID  Chronic anemia Hgb initially dropped w/ volume expansion - s/p 2U PRBC - no  evidence of acute blood loss - hemoglobin appears stable at this time   Chronic respiratory failure with trach and vent pulmonary critical care supposed to be managing vent - it appears the patient took him itself off the vent 10/7 and has not been back on since - currently appears stable on 28% trach collar   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 IVC filter placed 8/6  History of spinal canal injury with paraplegia  Severe malnutrition in context of chronic illness, Underweight  now the patient able to eat we have transitioned to a nocturnal tube feeding regimen  Code Status: FULL Family Communication: no family present at time of exam today  Disposition Plan: SDU   Consultants: PCCM - vent management   Procedures: none  Antibiotics: Imipenem 10/2 > Vanc 10/2 >  fosfomycin 10/6 > 10/10  DVT prophylaxis: lovenox   Objective: Blood pressure 144/89, pulse 83, temperature 97.9 F (36.6 C), temperature source Oral, resp. rate 20, height 5\' 7"  (1.702 m), weight 52 kg (114 lb 10.2 oz), SpO2 100 %.  Intake/Output Summary (Last 24 hours) at 07/18/15 1535 Last data filed at 07/18/15 1500  Gross per 24 hour  Intake   4450 ml  Output   4300 ml  Net    150 ml   Exam: General: No acute respiratory distress - respirations non-labored on RA w/ PMV in place  Lungs: Clear to auscultation bilaterally without wheezes or crackles Cardiovascular: Regular rate and rhythm without murmur    Abdomen: Nontender, nondistended, soft, bowel sounds positive, no rebound, no ascites, no appreciable mass Extremities: No significant cyanosis, clubbing, or edema bilateral lower extremities -  muscle wasting B LE   Data Reviewed: Basic Metabolic Panel:  Recent Labs Lab 07/14/15 0346 07/15/15 1034 07/16/15 0259 07/17/15 0111 07/18/15 0346  NA 133* 141 144 141 136  K 4.4 4.4 3.7 3.8 4.7  CL 108 116* 116* 112* 108  CO2 16* 17* 21* 19* 20*  GLUCOSE 309* 203* 106* 89 438*  BUN 45* 35* 27* 21* 19    CREATININE 0.77 0.64 0.52* 0.38* 0.52*  CALCIUM 9.1 9.5 9.7 9.5 9.3  MG  --  1.3* 1.7 1.4* 1.2*    CBC:  Recent Labs Lab 07/14/15 0346 07/15/15 1034 07/16/15 0259 07/17/15 0111 07/18/15 0346  WBC 30.7* 25.8* 19.4* 24.9* 24.4*  NEUTROABS  --  20.1* 13.1* 17.6* 18.9*  HGB 11.9* 11.2* 10.7* 11.4* 11.2*  HCT 35.8* 35.0* 33.9* 35.6* 34.9*  MCV 85.9 86.4 87.4 87.0 88.6  PLT 666* 534* 506* 534* 394    Liver Function Tests:  Recent Labs Lab 07/14/15 0346 07/15/15 1034 07/16/15 0259 07/17/15 0111 07/18/15 0346  AST 17 12* 13* 24 17  ALT 13* 9* 10* 13* 11*  ALKPHOS 159* 132* 137* 128* 122  BILITOT 0.4 0.3 0.4 0.2* 0.8  PROT 6.7 6.5 6.5 6.9 6.3*  ALBUMIN 1.8* 1.6* 1.6* 1.7* 1.7*   CBG:  Recent Labs Lab 07/17/15 0756 07/17/15 1116 07/17/15 2010 07/18/15 0752 07/18/15 1210  GLUCAP 97 100* 224* 565* 492*    Recent Results (from the past 240 hour(s))  Blood culture (routine x 2)     Status: None   Collection Time: 07/10/15  4:25 PM  Result Value Ref Range Status   Specimen Description BLOOD LEFT ARM  Final   Special Requests BOTTLES DRAWN AEROBIC AND ANAEROBIC 5CC  Final   Culture NO GROWTH 5 DAYS  Final   Report Status 07/15/2015 FINAL  Final  MRSA PCR Screening     Status: None   Collection Time: 07/11/15  1:00 AM  Result Value Ref Range Status   MRSA by PCR NEGATIVE NEGATIVE Final    Comment:        The GeneXpert MRSA Assay (FDA approved for NASAL specimens only), is one component of a comprehensive MRSA colonization surveillance program. It is not intended to diagnose MRSA infection nor to guide or monitor treatment for MRSA infections.   Blood culture (routine x 2)     Status: None   Collection Time: 07/11/15  4:19 AM  Result Value Ref Range Status   Specimen Description BLOOD RIGHT FOREARM  Final   Special Requests BOTTLES DRAWN AEROBIC ONLY 2CC  Final   Culture NO GROWTH 5 DAYS  Final   Report Status 07/16/2015 FINAL  Final  Urine culture      Status: None (Preliminary result)   Collection Time: 07/11/15  5:09 PM  Result Value Ref Range Status   Specimen Description URINE, RANDOM  Final   Special Requests NONE  Final   Culture   Final    >=100,000 COLONIES/mL PSEUDOMONAS AERUGINOSA AMIKACIN MIC=8 SENSITIVE AZTREONAM SENSITIVITIES TO FOLLOW >=100,000 COLONIES/mL YEAST    Report Status PENDING  Incomplete   Organism ID, Bacteria PSEUDOMONAS AERUGINOSA  Final      Susceptibility   Pseudomonas aeruginosa - MIC*    CEFTAZIDIME 8 SENSITIVE Sensitive     CIPROFLOXACIN >=4 RESISTANT Resistant     GENTAMICIN >=16 RESISTANT Resistant     IMIPENEM >=16 RESISTANT Resistant     CEFEPIME 16 INTERMEDIATE Intermediate     * >=100,000 COLONIES/mL PSEUDOMONAS AERUGINOSA  Studies:   Recent x-ray studies have been reviewed in detail by the Attending Physician  Scheduled Meds:  Scheduled Meds: . antiseptic oral rinse  7 mL Mouth Rinse QID  . baclofen  10 mg Per Tube 2 times per day  . baclofen  20 mg Per Tube QHS  . chlorhexidine gluconate  15 mL Mouth Rinse BID  . collagenase   Topical Daily  . enoxaparin (LOVENOX) injection  30 mg Subcutaneous Daily  . feeding supplement (VITAL AF 1.2 CAL)  1,000 mL Per Tube Q24H  . fentaNYL  50 mcg Transdermal Q72H  . imipenem-cilastatin  250 mg Intravenous 3 times per day  . Influenza vac split quadrivalent PF  0.5 mL Intramuscular Tomorrow-1000  . insulin aspart  0-15 Units Subcutaneous TID WC  . midodrine  30 mg Per Tube TID WC  . multivitamin with minerals  1 tablet Per Tube Daily  . pantoprazole sodium  40 mg Per Tube Daily  . pregabalin  100 mg Per Tube 3 times per day  . pregabalin  200 mg Per Tube QHS  . vancomycin  750 mg Intravenous Q24H    Time spent on care of this patient: 25 mins   Kaelah Hayashi T , MD   [facility] Office  [phone] Pager - Text Page per Shea Evans as per below:  On-Call/Text Page:      [email or web address]      password [email or web address]  If 7PM-7AM, please  contact night-coverage [email or web address] Password [email or web address] 07/18/2015, 3:35 PM   LOS: 8 days

## 2015-07-18 NOTE — Progress Notes (Signed)
Speech Language Pathology Treatment: Dysphagia  Patient Details Name: Jason [name] MRN: [id number] DOB: November 21, 1985 Today's Date: 07/18/2015 Time: 4709-6283 SLP Time Calculation (min) (ACUTE ONLY): 22 min  Assessment / Plan / Recommendation Clinical Impression  ST follow up for therapeutic diet tolerance.  Per nursing the patient has been doing well and she has gotten orders to discontinue tube feeds.  Chart review indicated that the patient's lungs are clear.  He did have a few low grade temps on 10/8 but none since then.  When asked he was able to recall the need to use a double swallow and was able to demonstrate proper use.  Overt s/s of aspiration were not observed.  The patient appeared to be tolerating the diet.  The patient was asking about possible diet upgrades particularly in relation to the liquids.  Risks of aspiration were discussed and the patient appeared to understand.     HPI Other Pertinent Information: 29 y.o. male with history of spinal cord injury status post paraplegia who was recently admitted for MRSA and microaerophilic strep bacteremia with respiratory failure requiring trach; nocturnal vent. Admitted due to AMS. Found to have UTI, hypothermia, hypotension, toxic metabolic encephalopathy. In August 2016 pt seen for in-line Passy-Muir Speaking valve, trach'd and continued with PMSV. MBS 05/2015 recommended NPO, received PEG. Today repeat MBS for possible initiation of food/liquid. He had Speaking valve from prior admission and currently in place.    Pertinent Vitals Pain Assessment: 0-10 Pain Score: 8  Pain Location: Buttocks Pain Intervention(s): Repositioned (RN notified)  SLP Plan  Continue with current plan of care    Recommendations Diet recommendations: Dysphagia 2 (fine chop);Honey-thick liquid Liquids provided via: Cup Medication Administration: Whole meds with puree Supervision: Staff to assist with self feeding Compensations: Slow rate;Small  sips/bites;Multiple dry swallows after each bite/sip Postural Changes and/or Swallow Maneuvers: Seated upright 90 degrees      Patient may use Passy-Muir Speech Valve: During all waking hours (remove during sleep) PMSV Supervision: Intermittent       Oral Care Recommendations: Oral care BID Plan: Continue with current plan of care    Grant, MA, CCC-SLP Acute Rehab SLP [phone]  Lamar Sprinkles 07/18/2015, 11:49 AM

## 2015-07-18 NOTE — Progress Notes (Signed)
[facility] for Infectious Disease    Date of Admission:  07/10/2015   Total days of antibiotics 9        Day 9 vanco/imi        3rd dose of fosfomycin           ID: Jason [name] is a 29 y.o. 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 antiseptic oral rinse  7 mL Mouth Rinse QID  . baclofen  10 mg Per Tube 2 times per day  . baclofen  20 mg Per Tube QHS  . chlorhexidine gluconate  15 mL Mouth Rinse BID  . collagenase   Topical Daily  . enoxaparin (LOVENOX) injection  30 mg Subcutaneous Daily  . feeding supplement (VITAL AF 1.2 CAL)  1,000 mL Per Tube Q24H  . fentaNYL  50 mcg Transdermal Q72H  . imipenem-cilastatin  250 mg Intravenous 3 times per day  . Influenza vac split quadrivalent PF  0.5 mL Intramuscular Tomorrow-1000  . insulin aspart  0-15 Units Subcutaneous TID WC  . midodrine  30 mg Per Tube TID WC  . multivitamin with minerals  1 tablet Per Tube Daily  . pantoprazole sodium  40 mg Per Tube Daily  . pregabalin  100 mg Per Tube 3 times per day  . pregabalin  200 mg Per Tube QHS  . vancomycin  750 mg Intravenous Q24H    Objective: Vital signs in last 24 hours: Temp:  [97 F (36.1 C)-98.4 F (36.9 C)] 97.9 F (36.6 C) (10/10 1210) Pulse Rate:  [72-103] 84 (10/10  1210) Resp:  [13-20] 20 (10/10 1210) BP: (99-154)/(63-98) 144/89 mmHg (10/10 1210) SpO2:  [97 %-100 %] 100 % (10/10 1210) FiO2 (%):  [28 %] 28 % (10/10 1159) Weight:  [114 lb 10.2 oz (52 kg)] 114 lb 10.2 oz (52 kg) (10/10 0426)  Exam unchanged  Lab Results  Recent Labs  07/17/15 0111 07/18/15 0346  WBC 24.9* 24.4*  HGB 11.4* 11.2*  HCT 35.6* 34.9*  NA 141 136  K 3.8 4.7  CL 112* 108  CO2 19* 20*  BUN 21* 19  CREATININE 0.38* 0.52*   Liver Panel  Recent Labs  07/17/15 0111 07/18/15 0346  PROT 6.9 6.3*  ALBUMIN 1.7* 1.7*  AST 24 17  ALT 13* 11*  ALKPHOS 128* 122  BILITOT 0.2* 0.8   Sedimentation Rate No results for input(s): ESRSEDRATE in the last 72 hours. C-Reactive Protein No results for input(s): CRP in the last 72 hours.  Microbiology:  Studies/Results: No results found.   Assessment/Plan: Decub ulcer = imaging did not suggest acute osteomyelitis. Will complicate 10 d course of vanco and imipenem for ulcer but monitor off of therapy. Currently on day 9 of 10.Wonder if this is more due to local  wound care. Continue with hydrotherapy and assessment for wound vac  Complicated PsA uti = patient received 3rd dose of fosfomycin  Ashlynd Michna, [facility] for Infectious Diseases Cell: [phone] Pager: [phone]  07/18/2015, 1:42 PM

## 2015-07-18 NOTE — Progress Notes (Signed)
Physical Therapy Wound Treatment Patient Details  Name: Jason [name] MRN: [id number] Date of Birth: 1986-06-28  Today's Date: 07/18/2015 Time: 3295-1884 Time Calculation (min): 34 min  Subjective  Subjective: No complaints Patient and Family Stated Goals: None stated Date of Onset:  (Present on admission) Prior Treatments: Has received hydrotherapy on previous admission  Pain Score:    Wound Assessment  Pressure Ulcer 07/11/15 Stage IV - Full thickness tissue loss with exposed bone, tendon or muscle. (Active)  Dressing Type Tape dressing;ABD;Barrier Film (skin prep);Gauze (Comment);Moist to dry 07/18/2015 12:21 PM  Dressing Changed;Clean;Dry;Intact 07/18/2015 12:21 PM  Dressing Change Frequency Daily 07/18/2015 12:21 PM  State of Healing Early/partial granulation 07/18/2015 12:21 PM  Site / Wound Assessment Pink;Red;Yellow;Granulation tissue 07/18/2015 12:21 PM  % Wound base Red or Granulating 75% 07/18/2015 12:21 PM  % Wound base Yellow 25% 07/18/2015 12:21 PM  Peri-wound Assessment Intact 07/18/2015 12:21 PM  Wound Length (cm) 8.4 cm 07/13/2015  9:44 AM  Wound Width (cm) 7.3 cm 07/13/2015  9:44 AM  Wound Depth (cm) 2.7 cm 07/13/2015  9:44 AM  Undermining (cm) 4.2 cm from 12:00 to 1:00 07/13/2015  9:44 AM  Margins Unattached edges (unapproximated) 07/18/2015 12:21 PM  Drainage Amount Moderate 07/18/2015 12:21 PM  Drainage Description Serosanguineous;Odor 07/18/2015 12:21 PM  Treatment Debridement (Selective);Hydrotherapy (Pulse lavage);Packing (Saline gauze) 07/18/2015 12:21 PM   Santyl applied to wound bed prior to applying dressing.   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 (Active)  Dressing Type ABD;Barrier Film (skin prep);Gauze (Comment);Moist to dry;Tape dressing 07/18/2015 12:21 PM  Dressing Changed;Clean;Dry;Intact 07/18/2015 12:21 PM  Dressing Change Frequency Daily 07/18/2015 12:21 PM  State of Healing  Early/partial granulation 07/18/2015 12:21 PM  Site / Wound Assessment Granulation tissue;Pink;Yellow;Red;Brown 07/18/2015 12:21 PM  % Wound base Red or Granulating 75% 07/18/2015 12:21 PM  % Wound base Yellow 5% 07/18/2015 12:21 PM  % Wound base Black 10% 07/18/2015 12:21 PM  % Wound base Other (Comment) 10% 07/18/2015 12:21 PM  Peri-wound Assessment Pink 07/18/2015 12:21 PM  Wound Length (cm) 10.4 cm 07/14/2015 10:29 AM  Wound Width (cm) 12.4 cm 07/14/2015 10:29 AM  Wound Depth (cm) 2.9 cm 07/14/2015 10:29 AM  Undermining (cm) From 8:00 to 12:00 3.5 cm - From 12:00 to 3:00 4.0 cm 07/14/2015 10:29 AM  Margins Epibole (rolled edges) 07/18/2015 12:21 PM  Drainage Amount Moderate 07/18/2015 12:21 PM  Drainage Description Serosanguineous;Green;Odor 07/18/2015 12:21 PM  Treatment Debridement (Selective);Hydrotherapy (Pulse lavage);Packing (Saline gauze) 07/18/2015 12:21 PM   Santyl applied to wound bed prior to applying dressing.    Hydrotherapy Pulsed lavage therapy - wound location: Lt ischial tuberosity and sacrum Pulsed Lavage with Suction (psi): 8 psi Pulsed Lavage with Suction - Normal Saline Used: 1000 mL Pulsed Lavage Tip: Tip with splash shield Selective Debridement Selective Debridement - Location: Lt ischial tuberosity and Sacrum Selective Debridement - Tools Used: Scissors;Forceps Selective Debridement - Tissue Removed: Yellow slough, Brown/black eschar   Wound Assessment and Plan  Wound Therapy - Assess/Plan/Recommendations Wound Therapy - Clinical Statement: No significant change since 10/8 Wound Therapy - Functional Problem List: Pt unable to tolerate prolonged sitting due to pressure ulcers over sacrum and ischial tuberosities Factors Delaying/Impairing Wound Healing: Incontinence;Infection - systemic/local;Polypharmacy;Diabetes Mellitus;Altered sensation;Immobility;Multiple medical problems Hydrotherapy Plan: Debridement;Dressing change;Patient/family education;Pulsatile  lavage with suction Wound Therapy - Frequency: 6X / week Wound Therapy - Follow Up Recommendations: Home health RN Wound Plan: see above  Wound Therapy Goals- Improve the function  of patient's integumentary system by progressing the wound(s) through the phases of wound healing (inflammation - proliferation - remodeling) by: Decrease Necrotic Tissue to: 10 Decrease Necrotic Tissue - Progress: Progressing toward goal Increase Granulation Tissue to: 90 Increase Granulation Tissue - Progress: Progressing toward goal Improve Drainage Characteristics: Min Improve Drainage Characteristics - Progress: Progressing toward goal  Goals will be updated until maximal potential achieved or discharge criteria met.  Discharge criteria: when goals achieved, discharge from hospital, MD decision/surgical intervention, no progress towards goals, refusal/missing three consecutive treatments without notification or medical reason.  GP     Maze Corniel 07/18/2015, 12:31 PM [facility] PT [phone]

## 2015-07-18 NOTE — Progress Notes (Signed)
Nutrition Consult/Follow Up  DOCUMENTATION CODES:   Severe malnutrition in context of chronic illness, Underweight  INTERVENTION:    Transition to nocturnal TF regimen: Vital AF 1.2 formula at 60 ml/hr from 6 PM to 6 AM.  Total time is 12 hours.  TF regimen to provide 864 kcals, 54 gm protein, 584 ml of free water  NUTRITION DIAGNOSIS:   Inadequate oral intake now related to chronic illness, dysphagia as evidenced by  (PO intake </= 50%), ongoing  GOAL:   Patient will meet greater than or equal to 90% of their needs, met  MONITOR:   TF tolerance, PO intake, Vent status, Labs, I & O's  ASSESSMENT:   [name] with PMH of spinal cord injury s/p paraplegia who was recently admitted for MRSA and microaerophilic strep bacteremia with respiratory failure requiring trach (vent at night); brought to ER after patient was found to have a hallucinations and confusion. Patient was recently tapered off his IV antibiotics and was placed on doxycycline and Cipro was started 3 days ago for possible UTI. In ER patient UA showed features concerning for UTI and patient has chronic suprapubic catheter. Patient has been admitted for possible developing sepsis.   Patient is currently on ATC.  Transferred out of SICU 10/5.  Vital AF 1.2 formula currently infusing via PEG tube at 50 ml/hr providing 1440 kcals, 90 gm protein, 973 ml of free water.  S/p MBSS 10/7.  Diet advanced to Dys 2, honey thick liquids.  PO intake variable at 25-50% per flowsheet records.  RD to change TF orders to nocturnal regimen (6 PM to 6 AM).  Diet Order:  DIET DYS 2 Room service appropriate?: Yes; Fluid consistency:: Honey Thick; Fluid restriction:: Other (see comments)  Skin:      Left plantar heel with Stage 2 pressure injury Left plantar foot near heel with unstageable pressure injury Left anterior foot near small toe with deep tissue injury Right ischium with Stage 4 pressure injury Left ischium with Stage 4  pressure injury Left hip with Stage 4 pressure injury Left posterior back with chronic full thickness wound  Last BM:  10/9  CBG (last 3)   Recent Labs  07/17/15 2010 07/18/15 0752 07/18/15 1210  GLUCAP 224* 565* 492*    Height:   Ht Readings from Last 1 Encounters:  07/13/15 5' 7"  (1.702 m)    Weight:   Wt Readings from Last 1 Encounters:  07/18/15 114 lb 10.2 oz (52 kg)   Wt Readings from Last 10 Encounters:  07/18/15 114 lb 10.2 oz (52 kg)  07/05/15 99 lb (44.906 kg)  06/01/15 111 lb (50.349 kg)  04/01/15 130 lb (58.968 kg)  02/20/15 111 lb 15.9 oz (50.8 kg)  02/16/15 112 lb (50.803 kg)  01/12/15 128 lb 4.9 oz (58.2 kg)  12/19/14 130 lb 1.1 oz (59 kg)  11/25/14 155 lb (70.308 kg)  11/19/14 155 lb 13.8 oz (70.7 kg)    Ideal Body Weight:  70 kg  BMI:  Body mass index is 17.95 kg/(m^2).  Re-estimated Nutritional Needs:   Kcal:  1700-1900  Protein:  90-100 gm  Fluid:  1.7-1.9 L  EDUCATION NEEDS:   No education needs identified at this time  Arthur Holms, RD, LDN Pager #: [phone] After-Hours Pager #: [phone]

## 2015-07-18 NOTE — Progress Notes (Addendum)
Inpatient Diabetes Program Recommendations  AACE/ADA: New Consensus Statement on Inpatient Glycemic Control (2015)  Target Ranges:  Prepandial:   less than 140 mg/dL      Peak postprandial:   less than 180 mg/dL (1-2 hours)      Critically ill patients:  140 - 180 mg/dL   Review of Glycemic Control:  Results for Jason [name], Jason [name] (MRN [id number]) as of 07/18/2015 09:26  Ref. Range 07/17/2015 20:10 07/18/2015 03:46 07/18/2015 07:52  Glucose-Capillary Latest Ref Range: 65-99 mg/dL 224 (H)  [facility])  Results for Jason [name], Jason [name] (MRN [id number]) as of 07/18/2015 10:27  Ref. Range 07/18/2015 03:46  CO2 Latest Ref Range: 22-32 mmol/L 20 (L)  BUN Latest Ref Range: 6-20 mg/dL 19  Creatinine Latest Ref Range: 0.61-1.24 mg/dL 0.52 (L)  Calcium Latest Ref Range: 8.9-10.3 mg/dL 9.3  EGFR (Non-African Amer.) Latest Ref Range: >60 mL/min >60  EGFR (African American) Latest Ref Range: >60 mL/min >60  Glucose Latest Ref Range: 65-99 mg/dL 438 (H)  Anion gap Latest Ref Range: 5-15  8    Diabetes history: Type 1 diabetes Outpatient Diabetes medications: Novolog 5 units tid with meals, Lantus 5 units q HS Current orders for Inpatient glycemic control:  Novolog moderate tid with meals-  Note that basal insulin was stopped due to low blood sugars on 07/17/15. Patient is also receiving Vital tube feeds 50 cc/hr.  Prior to d/c of Lantus, he was receiving Lantus 24 units q HS.  Note that labs this morning indicated CO2=20 and AG=8.    Inpatient Diabetes Program Recommendations:   Please restart Lantus 12 units daily.  Also consider decreasing Novolog correction to sensitive, but increase frequency to q 4 hours since patient is on continuous tube feeds.   Will follow.  Thanks, Adah Perl, RN, BC-ADM Inpatient Diabetes Coordinator Pager [phone]

## 2015-07-18 NOTE — Progress Notes (Signed)
Received pt with AM f/s >500. Pt had just had 240 cc orange juice and 480 cc sprite. Tube feed placed on hold, 15 Units Novolog insulin given. Tray order modified to exclude juices.

## 2015-07-18 NOTE — Progress Notes (Signed)
Jason [name] has been very clear about his desire to continue aggressive care and endorses a QOL that is acceptable to him. No symptoms/complaints. Palliative will sign off at this time but please reconsult with any changes or needs in the future. Thank you for this consult.   Vinie Sill, NP Palliative Medicine Team Pager # [phone] (M-F 8a-5p) Team Phone # [phone] (Nights/Weekends)

## 2015-07-19 DIAGNOSIS — J9621 Acute and chronic respiratory failure with hypoxia: Secondary | ICD-10-CM

## 2015-07-19 LAB — CBC
HCT: 34 % — ABNORMAL LOW (ref 39.0–52.0)
HEMOGLOBIN: 10.9 g/dL — AB (ref 13.0–17.0)
MCH: 28.3 pg (ref 26.0–34.0)
MCHC: 32.1 g/dL (ref 30.0–36.0)
MCV: 88.3 fL (ref 78.0–100.0)
PLATELETS: 412 10*3/uL — AB (ref 150–400)
RBC: 3.85 MIL/uL — AB (ref 4.22–5.81)
RDW: 15.7 % — ABNORMAL HIGH (ref 11.5–15.5)
WBC: 24.6 10*3/uL — AB (ref 4.0–10.5)

## 2015-07-19 LAB — BASIC METABOLIC PANEL
ANION GAP: 7 (ref 5–15)
BUN: 18 mg/dL (ref 6–20)
CHLORIDE: 100 mmol/L — AB (ref 101–111)
CO2: 20 mmol/L — ABNORMAL LOW (ref 22–32)
Calcium: 8.4 mg/dL — ABNORMAL LOW (ref 8.9–10.3)
Creatinine, Ser: 0.69 mg/dL (ref 0.61–1.24)
GFR calc Af Amer: >60 mL/min (ref >60)
Glucose, Bld: 430 mg/dL — ABNORMAL HIGH (ref 65–99)
POTASSIUM: 4.1 mmol/L (ref 3.5–5.1)
SODIUM: 127 mmol/L — AB (ref 135–145)

## 2015-07-19 LAB — GLUCOSE, CAPILLARY
GLUCOSE-CAPILLARY: 198 mg/dL — AB (ref 65–99)
GLUCOSE-CAPILLARY: 215 mg/dL — AB (ref 65–99)
GLUCOSE-CAPILLARY: 244 mg/dL — AB (ref 65–99)
GLUCOSE-CAPILLARY: 73 mg/dL (ref 65–99)
Glucose-Capillary: 46 mg/dL — ABNORMAL LOW (ref 65–99)
Glucose-Capillary: 53 mg/dL — ABNORMAL LOW (ref 65–99)
Glucose-Capillary: 166 mg/dL — ABNORMAL HIGH (ref 65–99)
Glucose-Capillary: 123 mg/dL — ABNORMAL HIGH (ref 65–99)

## 2015-07-19 MED ORDER — INSULIN GLARGINE 100 UNIT/ML ~~LOC~~ SOLN
17.0000 [IU] | Freq: Every day | SUBCUTANEOUS | Status: DC
  Filled 2015-07-19: qty 0.17

## 2015-07-19 NOTE — Progress Notes (Signed)
[facility] TEAM 1 - Stepdown/ICU TEAM Progress Note  Jason [name] OYD:[id number] DOB: 08-Oct-1986 DOA: 07/10/2015 PCP: [name], MD  Admit HPI / Brief Narrative: Jason [name] is a 29 y.o. BM PMHx Spinal cord injury status post paraplegia who was recently admitted for MRSA and microaerophilic strep bacteremia with respiratory failure requiring trach and is the patient is on vent in the nights was brought to the ER after patient was found to have a hallucination and confusion as per patient's mother. Patient was recently tapered off his IV antibiotics and was placed on doxycycline and Cipro was started 3 days ago for possible UTI.   Over the last 3 days patient was noticed to have increasing confusion and hallucination as per patient's mother. In the ER patient UA shows features concerning for UTI and patient has chronic suprapubic catheter. Patient is also found to be hypothermic and was also hypotensive which improved with fluid bolus. Patient has had previous history of multiple debridement for chronic sacral decubitus which at this time looks healing. Patient also has history of chronic osteomyelitis. Patient has been admitted for possible developing sepsis. On call pulmonary critical care has been consulted and patient has been admitted for further management.  HPI/Subjective: 10/11  A/O 4, Asian sitting in bed comfortably joking with hospital staff, Negative CP, negative SOB. Patient only wants to know when he will be able to be discharged  Assessment/Plan: Toxic metabolic encephalopathy -Mental status at baseline  Sepsis due to UTI positive MDR Pseudomonas Aeruginosa(cause/exacerbated by chronic indwelling cath)  -Complete 10 day course of vancomycin and imipenem then monitor off therapy per ID; 10/11 should be last day of treatment - blood cultures pending -Now patient has been on several days of antibiotics replace his suprapubic cath    Acute renal failure -Due to  sepsis - improving w/ volume expansion   Hyponatremia  -Increase normal saline 50 ml/hr -Resolved   Diabetes mellitus type 1 with hyperglycemia -Well controlled - Increase Lantus 17 units daily -Continue moderate SSI   Sacral decubitus/bilateral ischeal ulcers and left chest wall decubitus (osteomyelitis) -After a few days of hydrotherapy will consult for wound Vac -[place] Bed maintain in rotational mode -10/11 consult surgery to see if areas ready for wound VAC and they recommended continuation of PT hydrotherapy  Chronic anemia -Worsening secondary to hemodilution  -Transfuse for hemoglobin <7  -10/4 Transfuse 2 units PRBC  -Stable  Chronic respiratory failure with trach and vent -pulmonary critical care following and managing vent  -PCXR in a.m.  Hypomagnesemia -Magnesium IV 3 gm  History of spinal cord injury with paraplegia  Nutrition -Passed MBS today, dysphagia 2 diet honey thick liquid   Goals of care -Palliative care consult placed. Patient with spinal cord injury resulting in paraplegia, indwelling cath with multiple infections, nonhealing multiple decubitus ulcers to sacrum and bilateral buttocks resulting in frequent hospitalization; short-term vs long-term goals of care   Code Status: FULL Family Communication: None  Disposition Plan: SNF? Will await recommendations from palliative care    Consultants: PCCM - vent management   Procedure/Significant Events:    Culture 10/2 blood left negative 10/3 blood right forearm NGTD 10/3 urine positive MDR Pseudomonas Aeruginosa  10/3 MRSA by PCR negative  Antibiotics: Imipenem 10/2 > Vanc 10/2 > Fosfomycin 10/6>> stopped 10/10  DVT prophylaxis: lovenox    Devices    LINES / TUBES:      Continuous Infusions: . sodium chloride 30 mL/hr at 07/18/15 2033    Objective:  VITAL SIGNS: Temp: 98.5 F (36.9 C) (10/11 1236) Temp Source: Oral (10/11 1236) BP: 136/90 mmHg (10/11 1236) Pulse  Rate: 71 (10/11 1236) SPO2; FIO2:   Intake/Output Summary (Last 24 hours) at 07/19/15 1344 Last data filed at 07/19/15 0900  Gross per 24 hour  Intake   3245 ml  Output   3125 ml  Net    120 ml     Exam: General: A/O 4, NAD, No acute respiratory distress Eyes: Negative headache, eye pain, double vision,negative scleral hemorrhage ENT: Negative Runny nose, negative ear pain, negative gingival bleeding, Neck:  Negative scars, masses, torticollis, lymphadenopathy, JVD Lungs: Clear to auscultation bilaterally without wheezes or crackles Cardiovascular: Regular rate and rhythm without murmur gallop or rub normal S1 and S2 Abdomen:negative abdominal pain, nondistended, positive soft, bowel sounds, no rebound, no ascites, no appreciable mass, suprapubic catheter in place negative discharge or sign of infection Extremities: atrophy, large stage 4 sacral decubitus ulcer, bilateral ischeal stage IV decubitus ulcers, unstageable left heel and toe ulcer Psychiatric:  Negative depression, negative anxiety, negative fatigue, negative mania  Neurologic:  Cranial nerves II through XII intact, tongue/uvula midline, bilateral upper extremity gross muscles strength 2/5, bilateral lower extremity paralysis negative dysarthria, negative expressive aphasia, negative receptive aphasia.   Data Reviewed: Basic Metabolic Panel:  Recent Labs Lab 07/15/15 1034 07/16/15 0259 07/17/15 0111 07/18/15 0346 07/19/15 0038  NA 141 144 141 136 127*  K 4.4 3.7 3.8 4.7 4.1  CL 116* 116* 112* 108 100*  CO2 17* 21* 19* 20* 20*  GLUCOSE 203* 106* 89 438* 430*  BUN 35* 27* 21* 19 18  CREATININE 0.64 0.52* 0.38* 0.52* 0.69  CALCIUM 9.5 9.7 9.5 9.3 8.4*  MG 1.3* 1.7 1.4* 1.2*  --    Liver Function Tests:  Recent Labs Lab 07/14/15 0346 07/15/15 1034 07/16/15 0259 07/17/15 0111 07/18/15 0346  AST 17 12* 13* 24 17  ALT 13* 9* 10* 13* 11*  ALKPHOS 159* 132* 137* 128* 122  BILITOT 0.4 0.3 0.4 0.2* 0.8    PROT 6.7 6.5 6.5 6.9 6.3*  ALBUMIN 1.8* 1.6* 1.6* 1.7* 1.7*   No results for input(s): LIPASE, AMYLASE in the last 168 hours. No results for input(s): AMMONIA in the last 168 hours. CBC:  Recent Labs Lab 07/15/15 1034 07/16/15 0259 07/17/15 0111 07/18/15 0346 07/19/15 0038  WBC 25.8* 19.4* 24.9* 24.4* 24.6*  NEUTROABS 20.1* 13.1* 17.6* 18.9*  --   HGB 11.2* 10.7* 11.4* 11.2* 10.9*  HCT 35.0* 33.9* 35.6* 34.9* 34.0*  MCV 86.4 87.4 87.0 88.6 88.3  PLT 534* 506* 534* 394 412*   Cardiac Enzymes: No results for input(s): CKTOTAL, CKMB, CKMBINDEX, TROPONINI in the last 168 hours. BNP (last 3 results) No results for input(s): BNP in the last 8760 hours.  ProBNP (last 3 results) No results for input(s): PROBNP in the last 8760 hours.  CBG:  Recent Labs Lab 07/18/15 1610 07/18/15 2036 07/19/15 0019 07/19/15 0448 07/19/15 0727  GLUCAP 365* 227* 244* 215* 198*    Recent Results (from the past 240 hour(s))  Blood culture (routine x 2)     Status: None   Collection Time: 07/10/15  4:25 PM  Result Value Ref Range Status   Specimen Description BLOOD LEFT ARM  Final   Special Requests BOTTLES DRAWN AEROBIC AND ANAEROBIC 5CC  Final   Culture NO GROWTH 5 DAYS  Final   Report Status 07/15/2015 FINAL  Final  MRSA PCR Screening     Status: None  Collection Time: 07/11/15  1:00 AM  Result Value Ref Range Status   MRSA by PCR NEGATIVE NEGATIVE Final    Comment:        The GeneXpert MRSA Assay (FDA approved for NASAL specimens only), is one component of a comprehensive MRSA colonization surveillance program. It is not intended to diagnose MRSA infection nor to guide or monitor treatment for MRSA infections.   Blood culture (routine x 2)     Status: None   Collection Time: 07/11/15  4:19 AM  Result Value Ref Range Status   Specimen Description BLOOD RIGHT FOREARM  Final   Special Requests BOTTLES DRAWN AEROBIC ONLY 2CC  Final   Culture NO GROWTH 5 DAYS  Final   Report  Status 07/16/2015 FINAL  Final  Urine culture     Status: None (Preliminary result)   Collection Time: 07/11/15  5:09 PM  Result Value Ref Range Status   Specimen Description URINE, RANDOM  Final   Special Requests NONE  Final   Culture   Final    >=100,000 COLONIES/mL PSEUDOMONAS AERUGINOSA AMIKACIN MIC=8 SENSITIVE >=100,000 COLONIES/mL YEAST    Report Status PENDING  Incomplete   Organism ID, Bacteria PSEUDOMONAS AERUGINOSA  Final      Susceptibility   Pseudomonas aeruginosa - MIC*    CEFTAZIDIME 8 SENSITIVE Sensitive     CIPROFLOXACIN >=4 RESISTANT Resistant     GENTAMICIN >=16 RESISTANT Resistant     IMIPENEM >=16 RESISTANT Resistant     CEFEPIME 16 INTERMEDIATE Intermediate     AZTREONAM Value in next row       NOT DONERESISTANT    * >=100,000 COLONIES/mL PSEUDOMONAS AERUGINOSA     Studies:  Recent x-ray studies have been reviewed in detail by the Attending Physician  Scheduled Meds:  Scheduled Meds: . antiseptic oral rinse  7 mL Mouth Rinse QID  . baclofen  10 mg Per Tube 2 times per day  . baclofen  20 mg Per Tube QHS  . chlorhexidine gluconate  15 mL Mouth Rinse BID  . collagenase   Topical Daily  . enoxaparin (LOVENOX) injection  30 mg Subcutaneous Daily  . feeding supplement (VITAL AF 1.2 CAL)  1,000 mL Per Tube Q24H  . fentaNYL  50 mcg Transdermal Q72H  . imipenem-cilastatin  250 mg Intravenous 3 times per day  . Influenza vac split quadrivalent PF  0.5 mL Intramuscular Tomorrow-1000  . insulin aspart  0-15 Units Subcutaneous 6 times per day  . insulin glargine  12 Units Subcutaneous Daily  . midodrine  30 mg Per Tube TID WC  . multivitamin with minerals  1 tablet Per Tube Daily  . pantoprazole sodium  40 mg Per Tube Daily  . pregabalin  100 mg Per Tube 3 times per day  . pregabalin  200 mg Per Tube QHS  . vancomycin  750 mg Intravenous Q24H    Time spent on care of this patient: 40 mins   Jatasia Gundrum, Geraldo Docker , MD  [facility] Office   [phone] Pager [phone]  On-Call/Text Page:      [email or web address]      password [email or web address]  If 7PM-7AM, please contact night-coverage [email or web address] Password [email or web address] 07/19/2015, 1:44 PM   LOS: 9 days   Care during the described time interval was provided by me .  I have reviewed this patient's available data, including medical history, events of note, physical examination, and all test results as part of my evaluation. I have personally reviewed and interpreted all  radiology studies.   Dia Crawford, MD [phone] Pager

## 2015-07-19 NOTE — Progress Notes (Signed)
[address] [place], [place] [address]    Phone: [phone] FAX: [phone]     Subjective: 29 y/o with C6 spinal cord injury wheelchair bound admitted on 102/16 sepsis due to pseudomonas UTI.  He is known to our service from previous hospitalization.  He had a I&D of sacral and ischial ulcers in December of 2015 and a diverting colostomy in January of 2016.  Since then Dr. Migdalia Dk has performed multiple I&Ds and is currently followed at the wound center.  We have been asked to evaluate for [facility] placement.    Objective:  Vital signs:  Filed Vitals:   07/19/15 0800 07/19/15 1151 07/19/15 1236 07/19/15 1532  BP: 104/65  136/90   Pulse: 78  71   Temp:   98.5 F (36.9 C)   TempSrc:   Oral   Resp:   19   Height:      Weight:      SpO2: 100% 100% 100% 100%    Last BM Date: 07/17/15  Intake/Output   Yesterday:  10/10 0701 - 10/11 0700 In: 9064 [P.O.:2160; I.V.:1015; NG/GT:920; IV Piggyback:450] Out: 4250 [Urine:3000; Stool:1250] This shift:  Total I/O In: 580 [P.O.:240; I.V.:240; IV Piggyback:100] Out: 675 [Urine:425; Stool:250]   Physical Exam: General: Pt awake/alert/oriented x4 in no acute distress Skin: sacral decub about 2-3 cm area of necrosis at the edge of the wound 9 o'clock. Wound bed is otherwise clean.  Bilateral ischial ulcers with some fibrinous tissue at the base.    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    Results:   Labs: Results for orders placed or performed during the hospital encounter of 07/10/15 (from the past 48 hour(s))  Glucose, capillary     Status: Abnormal   Collection Time: 07/17/15  8:10 PM  Result Value Ref Range   Glucose-Capillary 224 (H) 65 - 99 mg/dL  CBC with Differential/Platelet     Status: Abnormal   Collection Time: 07/18/15  3:46 AM  Result Value Ref Range   WBC 24.4 (H) 4.0 - 10.5 K/uL    Comment: CORRECTED ON 10/10 AT 0419: PREVIOUSLY REPORTED AS 25.3   RBC 3.94 (L) 4.22 - 5.81 MIL/uL    Comment: CORRECTED ON 10/10 AT 0419: PREVIOUSLY REPORTED AS 3.91   Hemoglobin 11.2 (L) 13.0 - 17.0 g/dL    Comment: CORRECTED ON 10/10 AT 0419: PREVIOUSLY REPORTED AS 11.0   HCT 34.9 (L) 39.0 - 52.0 %    Comment: CORRECTED ON 10/10 AT 0419: PREVIOUSLY REPORTED AS 35.3   MCV 88.6 78.0 - 100.0 fL    Comment: CORRECTED ON 10/10 AT 0419: PREVIOUSLY REPORTED AS 90.3   MCH 28.4 26.0 - 34.0 pg    Comment: CORRECTED ON 10/10 AT 0419: PREVIOUSLY REPORTED AS 28.1   MCHC 32.1  30.0 - 36.0 g/dL    Comment: CORRECTED ON 10/10 AT 0419: PREVIOUSLY REPORTED AS 31.2   RDW 16.2 (H) 11.5 - 15.5 %    Comment: CORRECTED ON 10/10 AT 0419: PREVIOUSLY REPORTED AS 16.3   Platelets 394 150 - 400 K/uL    Comment: CORRECTED ON 10/10 AT 0419: PREVIOUSLY REPORTED AS 392   Neutrophils Relative % 77 %   Neutro Abs 18.9 (H) 1.7 - 7.7 K/uL   Lymphocytes Relative 10 %   Lymphs Abs 2.4 0.7 - 4.0 K/uL   Monocytes Relative 11 %   Monocytes Absolute 2.6 (H) 0.1 - 1.0 K/uL   Eosinophils Relative 1 %   Eosinophils Absolute 0.4 0.0 - 0.7 K/uL   Basophils Relative 1 %   Basophils Absolute 0.1 0.0 - 0.1 K/uL  Comprehensive metabolic panel     Status: Abnormal   Collection Time: 07/18/15  3:46 AM  Result Value Ref Range   Sodium 136 135 - 145 mmol/L   Potassium 4.7 3.5 - 5.1  mmol/L    Comment: DELTA CHECK NOTED   Chloride 108 101 - 111 mmol/L   CO2 20 (L) 22 - 32 mmol/L   Glucose, Bld 438 (H) 65 - 99 mg/dL   BUN 19 6 - 20 mg/dL   Creatinine, Ser 0.52 (L) 0.61 - 1.24 mg/dL   Calcium 9.3 8.9 - 10.3 mg/dL   Total Protein 6.3 (L) 6.5 - 8.1 g/dL   Albumin 1.7 (L) 3.5 - 5.0 g/dL   AST 17 15 - 41 U/L   ALT 11 (L) 17 - 63 U/L   Alkaline Phosphatase 122 38 - 126 U/L   Total Bilirubin 0.8 0.3 - 1.2 mg/dL   GFR calc non Af Amer >60 >60 mL/min   GFR calc Af Amer >60 >60 mL/min    Comment: (NOTE) The eGFR has been calculated using the CKD EPI equation. This calculation has not been validated in all clinical situations. eGFR's persistently <60 mL/min signify possible Chronic Kidney Disease.    Anion gap 8 5 - 15  Magnesium     Status: Abnormal   Collection Time: 07/18/15  3:46 AM  Result Value Ref Range   Magnesium 1.2 (L) 1.7 - 2.4 mg/dL  Glucose, capillary     Status: Abnormal   Collection Time: 07/18/15  7:52 AM  Result Value Ref Range   Glucose-Capillary 565 (HH) 65 - 99 mg/dL  Glucose, capillary     Status: Abnormal   Collection Time: 07/18/15 12:10 PM  Result Value Ref Range   Glucose-Capillary 492 (H) 65 - 99 mg/dL  Glucose, capillary     Status: Abnormal   Collection Time: 07/18/15  4:10 PM  Result Value Ref Range   Glucose-Capillary 365 (H) 65 - 99 mg/dL  Glucose, capillary     Status: Abnormal   Collection Time: 07/18/15  8:36 PM  Result Value Ref Range   Glucose-Capillary 227 (H) 65 - 99 mg/dL  Glucose, capillary     Status: Abnormal   Collection Time: 07/19/15 12:19 AM  Result Value Ref Range   Glucose-Capillary 244 (H) 65 - 99 mg/dL  CBC     Status: Abnormal   Collection Time: 07/19/15 12:38 AM  Result Value Ref Range   WBC 24.6 (H) 4.0 - 10.5 K/uL   RBC 3.85 (L) 4.22 - 5.81 MIL/uL   Hemoglobin 10.9 (L) 13.0 - 17.0 g/dL   HCT 34.0 (L) 39.0 - 52.0 %   MCV 88.3 78.0 - 100.0  fL   MCH 28.3 26.0 - 34.0 pg   MCHC 32.1 30.0 - 36.0 g/dL    RDW 15.7 (H) 11.5 - 15.5 %   Platelets 412 (H) 150 - 400 K/uL  Basic metabolic panel     Status: Abnormal   Collection Time: 07/19/15 12:38 AM  Result Value Ref Range   Sodium 127 (L) 135 - 145 mmol/L    Comment: DELTA CHECK NOTED   Potassium 4.1 3.5 - 5.1 mmol/L   Chloride 100 (L) 101 - 111 mmol/L   CO2 20 (L) 22 - 32 mmol/L   Glucose, Bld 430 (H) 65 - 99 mg/dL   BUN 18 6 - 20 mg/dL   Creatinine, Ser 0.69 0.61 - 1.24 mg/dL   Calcium 8.4 (L) 8.9 - 10.3 mg/dL   GFR calc non Af Amer >60 >60 mL/min   GFR calc Af Amer >60 >60 mL/min    Comment: (NOTE) The eGFR has been calculated using the CKD EPI equation. This calculation has not been validated in all clinical situations. eGFR's persistently <60 mL/min signify possible Chronic Kidney Disease.    Anion gap 7 5 - 15  Glucose, capillary     Status: Abnormal   Collection Time: 07/19/15  4:48 AM  Result Value Ref Range   Glucose-Capillary 215 (H) 65 - 99 mg/dL  Glucose, capillary     Status: Abnormal   Collection Time: 07/19/15  7:27 AM  Result Value Ref Range   Glucose-Capillary 198 (H) 65 - 99 mg/dL  Glucose, capillary     Status: None   Collection Time: 07/19/15 12:33 PM  Result Value Ref Range   Glucose-Capillary 73 65 - 99 mg/dL    Imaging / Studies: No results found.  Medications / Allergies:  Scheduled Meds: . antiseptic oral rinse  7 mL Mouth Rinse QID  . baclofen  10 mg Per Tube 2 times per day  . baclofen  20 mg Per Tube QHS  . chlorhexidine gluconate  15 mL Mouth Rinse BID  . collagenase   Topical Daily  . enoxaparin (LOVENOX) injection  30 mg Subcutaneous Daily  . feeding supplement (VITAL AF 1.2 CAL)  1,000 mL Per Tube Q24H  . fentaNYL  50 mcg Transdermal Q72H  . imipenem-cilastatin  250 mg Intravenous 3 times per day  . Influenza vac split quadrivalent PF  0.5 mL Intramuscular Tomorrow-1000  . insulin aspart  0-15 Units Subcutaneous 6 times per day  . [START ON 07/20/2015] insulin glargine  17 Units  Subcutaneous Daily  . midodrine  30 mg Per Tube TID WC  . multivitamin with minerals  1 tablet Per Tube Daily  . pantoprazole sodium  40 mg Per Tube Daily  . pregabalin  100 mg Per Tube 3 times per day  . pregabalin  200 mg Per Tube QHS  . vancomycin  750 mg Intravenous Q24H   Continuous Infusions: . sodium chloride 50 mL/hr at 07/19/15 1428   PRN Meds:.acetaminophen **OR** acetaminophen, albuterol, LORazepam, mirtazapine, ondansetron **OR** ondansetron (ZOFRAN) IV, oxyCODONE, RESOURCE THICKENUP CLEAR  Antibiotics: Anti-infectives    Start     Dose/Rate Route Frequency Ordered Stop   07/18/15 0000  vancomycin (VANCOCIN) IVPB 750 mg/150 ml premix     750 mg 150 mL/hr over 60 Minutes Intravenous Every 24 hours 07/17/15 0508     07/17/15 0100  vancomycin (VANCOCIN) 500 mg in sodium chloride 0.9 % 100 mL IVPB  Status:  Discontinued     500 mg 100 mL/hr over  60 Minutes Intravenous Every 24 hours 07/16/15 1016 07/17/15 0507   07/13/15 2000  imipenem-cilastatin (PRIMAXIN) 250 mg in sodium chloride 0.9 % 100 mL IVPB     250 mg 200 mL/hr over 30 Minutes Intravenous 3 times per day 07/13/15 1046     07/11/15 2200  imipenem-cilastatin (PRIMAXIN) 250 mg in sodium chloride 0.9 % 100 mL IVPB  Status:  Discontinued     250 mg 200 mL/hr over 30 Minutes Intravenous Every 12 hours 07/11/15 1020 07/13/15 1046   07/10/15 2230  imipenem-cilastatin (PRIMAXIN) 250 mg in sodium chloride 0.9 % 100 mL IVPB  Status:  Discontinued     250 mg 200 mL/hr over 30 Minutes Intravenous 3 times per day 07/10/15 2226 07/11/15 1020   07/10/15 2230  vancomycin (VANCOCIN) 500 mg in sodium chloride 0.9 % 100 mL IVPB  Status:  Discontinued     500 mg 100 mL/hr over 60 Minutes Intravenous Every 24 hours 07/10/15 2226 07/16/15 1016        Assessment/Plan Sacral and ischial decubitus ulcers- Continue with PT hydrotherapy.  Continue with wet to dry dressing changes.  No need for surgical debridement.  Would defer to [facility]  for further management, but would probably hold off on VAC placement at this time due to area of necrosis at the wound edge of the sacral decub.  Call with questions or concerns.   Erby Pian, [facility] Pager [phone]) For consults and floor pages call [phone]([id number])  07/19/2015  4:17 PM

## 2015-07-19 NOTE — Progress Notes (Signed)
Physical Therapy Wound Treatment Patient Details  Name: Jason [name] MRN: [id number] Date of Birth: 04-15-1986  Today's Date: 07/19/2015 Time: 0932-3557 Time Calculation (min): 40 min  Subjective  Subjective: Pt reports he didn't sleep well last night. 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 (Active)  Dressing Type ABD;Barrier Film (skin prep);Gauze (Comment);Moist to dry;Tape dressing 07/19/2015 12:08 PM  Dressing Changed;Clean;Dry;Intact 07/19/2015 12:08 PM  Dressing Change Frequency Daily 07/19/2015 12:08 PM  State of Healing Early/partial granulation 07/19/2015 12:08 PM  Site / Wound Assessment Pink;Red;Yellow;Granulation tissue 07/19/2015 12:08 PM  % Wound base Red or Granulating 85% 07/19/2015 12:08 PM  % Wound base Yellow 15% 07/19/2015 12:08 PM  Peri-wound Assessment Intact 07/19/2015 12:08 PM  Wound Length (cm) 8.4 cm 07/13/2015  9:44 AM  Wound Width (cm) 7.3 cm 07/13/2015  9:44 AM  Wound Depth (cm) 2.7 cm 07/13/2015  9:44 AM  Undermining (cm) 4.2 cm from 12:00 to 1:00 07/13/2015  9:44 AM  Margins Unattached edges (unapproximated) 07/19/2015 12:08 PM  Drainage Amount Moderate 07/19/2015 12:08 PM  Drainage Description Serosanguineous 07/19/2015 12:08 PM  Treatment Debridement (Selective);Hydrotherapy (Pulse lavage);Packing (Saline gauze) 07/19/2015 12:08 PM   Santyl applied to wound bed prior to applying dressing.   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 (Active)  Dressing Type ABD;Barrier Film (skin prep);Gauze (Comment);Moist to dry;Tape dressing 07/19/2015 12:08 PM  Dressing Changed;Clean;Dry;Intact 07/19/2015 12:08 PM  Dressing Change Frequency Daily 07/19/2015  12:08 PM  State of Healing Early/partial granulation 07/19/2015 12:08 PM  Site / Wound Assessment Granulation tissue;Pink;Yellow;Red;Brown 07/19/2015 12:08 PM  % Wound base Red or Granulating 75% 07/19/2015 12:08 PM  % Wound base Yellow 5% 07/19/2015 12:08 PM  % Wound base Black 10% 07/19/2015 12:08 PM  % Wound base Other (Comment) 10% 07/19/2015 12:08 PM  Peri-wound Assessment Pink 07/19/2015 12:08 PM  Wound Length (cm) 10.4 cm 07/14/2015 10:29 AM  Wound Width (cm) 12.4 cm 07/14/2015 10:29 AM  Wound Depth (cm) 2.9 cm 07/14/2015 10:29 AM  Undermining (cm) From 8:00 to 12:00 3.5 cm - From 12:00 to 3:00 4.0 cm 07/14/2015 10:29 AM  Margins Epibole (rolled edges) 07/19/2015 12:08 PM  Drainage Amount Moderate 07/19/2015 12:08 PM  Drainage Description Serosanguineous 07/19/2015 12:08 PM  Treatment Debridement (Selective);Hydrotherapy (Pulse lavage);Packing (Saline gauze) 07/18/2015 12:21 PM   Santyl applied to wound bed prior to applying dressing.    Hydrotherapy Pulsed lavage therapy - wound location: Lt ischial tuberosity and sacrum Pulsed Lavage with Suction (psi): 8 psi Pulsed Lavage with Suction - Normal Saline Used: 1000 mL Pulsed Lavage Tip: Tip with splash shield Selective Debridement Selective Debridement - Location: Lt ischial tuberosity and Sacrum Selective Debridement - Tools Used: Scissors;Forceps Selective Debridement - Tissue Removed: Yellow slough, Brown/black eschar   Wound Assessment and Plan  Wound Therapy - Assess/Plan/Recommendations Wound Therapy - Clinical Statement: Sacral wound with some necrosis on edges but otherwise continues to improve. From hydrotherapy standpoint pt can return home with dressing changes.  Wound Therapy - Functional Problem List: Pt unable to tolerate prolonged sitting due to pressure ulcers over sacrum and ischial tuberosities Factors Delaying/Impairing Wound Healing: Incontinence;Infection - systemic/local;Polypharmacy;Diabetes Mellitus;Altered  sensation;Immobility;Multiple medical problems Hydrotherapy Plan: Debridement;Dressing change;Patient/family education;Pulsatile lavage with suction Wound Therapy -  Frequency: 6X / week Wound Therapy - Follow Up Recommendations: Home health RN Wound Plan: see above  Wound Therapy Goals- Improve the function of patient's integumentary system by progressing the wound(s) through the phases of wound healing (inflammation - proliferation - remodeling) by: Decrease Necrotic Tissue to: 10 Decrease Necrotic Tissue - Progress: Progressing toward goal Increase Granulation Tissue to: 90 Increase Granulation Tissue - Progress: Progressing toward goal Improve Drainage Characteristics: Min Improve Drainage Characteristics - Progress: Progressing toward goal  Goals will be updated until maximal potential achieved or discharge criteria met.  Discharge criteria: when goals achieved, discharge from hospital, MD decision/surgical intervention, no progress towards goals, refusal/missing three consecutive treatments without notification or medical reason.  [name] 07/19/2015, 12:14 PM [name]

## 2015-07-20 ENCOUNTER — Inpatient Hospital Stay (HOSPITAL_COMMUNITY): Payer: Medicaid Other

## 2015-07-20 LAB — GLUCOSE, CAPILLARY
GLUCOSE-CAPILLARY: 152 mg/dL — AB (ref 65–99)
GLUCOSE-CAPILLARY: 141 mg/dL — AB (ref 65–99)
GLUCOSE-CAPILLARY: 179 mg/dL — AB (ref 65–99)
GLUCOSE-CAPILLARY: 187 mg/dL — AB (ref 65–99)
GLUCOSE-CAPILLARY: 230 mg/dL — AB (ref 65–99)
Glucose-Capillary: 168 mg/dL — ABNORMAL HIGH (ref 65–99)
Glucose-Capillary: 179 mg/dL — ABNORMAL HIGH (ref 65–99)
Glucose-Capillary: 147 mg/dL — ABNORMAL HIGH (ref 65–99)

## 2015-07-20 LAB — COMPREHENSIVE METABOLIC PANEL
ALK PHOS: 114 U/L (ref 38–126)
ALT: 19 U/L (ref 17–63)
AST: 33 U/L (ref 15–41)
Albumin: 1.6 g/dL — ABNORMAL LOW (ref 3.5–5.0)
Anion gap: 7 (ref 5–15)
BUN: 14 mg/dL (ref 6–20)
CALCIUM: 8.8 mg/dL — AB (ref 8.9–10.3)
CHLORIDE: 101 mmol/L (ref 101–111)
CO2: 24 mmol/L (ref 22–32)
CREATININE: 0.55 mg/dL — AB (ref 0.61–1.24)
Glucose, Bld: 119 mg/dL — ABNORMAL HIGH (ref 65–99)
Potassium: 3.7 mmol/L (ref 3.5–5.1)
Sodium: 132 mmol/L — ABNORMAL LOW (ref 135–145)
Total Bilirubin: 0.2 mg/dL — ABNORMAL LOW (ref 0.3–1.2)
Total Protein: 6.4 g/dL — ABNORMAL LOW (ref 6.5–8.1)

## 2015-07-20 LAB — CBC WITH DIFFERENTIAL/PLATELET
BASOS PCT: 0 %
Basophils Absolute: 0 10*3/uL (ref 0.0–0.1)
EOS PCT: 2 %
Eosinophils Absolute: 0.4 10*3/uL (ref 0.0–0.7)
HEMATOCRIT: 34.3 % — AB (ref 39.0–52.0)
HEMOGLOBIN: 10.9 g/dL — AB (ref 13.0–17.0)
LYMPHS PCT: 14 %
Lymphs Abs: 3 10*3/uL (ref 0.7–4.0)
MCH: 27.5 pg (ref 26.0–34.0)
MCHC: 31.8 g/dL (ref 30.0–36.0)
MCV: 86.4 fL (ref 78.0–100.0)
MONO ABS: 2.6 10*3/uL — AB (ref 0.1–1.0)
MONOS PCT: 12 %
NEUTROS PCT: 72 %
Neutro Abs: 15.3 10*3/uL — ABNORMAL HIGH (ref 1.7–7.7)
PLATELETS: 415 10*3/uL — AB (ref 150–400)
RBC: 3.97 MIL/uL — AB (ref 4.22–5.81)
RDW: 15.4 % (ref 11.5–15.5)
WBC: 21.3 10*3/uL — AB (ref 4.0–10.5)

## 2015-07-20 LAB — MAGNESIUM: MAGNESIUM: 1.1 mg/dL — AB (ref 1.7–2.4)

## 2015-07-20 MED ORDER — ACETAMINOPHEN 325 MG PO TABS
650.0000 mg | ORAL_TABLET | Freq: Four times a day (QID) | ORAL | Status: DC | PRN
  Administered 2015-07-22: 650 mg via ORAL
  Filled 2015-07-20 (×2): qty 2

## 2015-07-20 MED ORDER — INSULIN GLARGINE 100 UNIT/ML ~~LOC~~ SOLN
12.0000 [IU] | Freq: Every day | SUBCUTANEOUS | Status: DC
  Administered 2015-07-20 – 2015-07-21 (×2): 12 [IU] via SUBCUTANEOUS
  Filled 2015-07-20 (×3): qty 0.12

## 2015-07-20 MED ORDER — PREGABALIN 75 MG PO CAPS
200.0000 mg | ORAL_CAPSULE | Freq: Every day | ORAL | Status: DC
  Administered 2015-07-20 – 2015-08-15 (×27): 200 mg via ORAL
  Filled 2015-07-20 (×3): qty 2
  Filled 2015-07-20: qty 1
  Filled 2015-07-20: qty 2
  Filled 2015-07-20 (×2): qty 1
  Filled 2015-07-20 (×2): qty 2
  Filled 2015-07-20: qty 1
  Filled 2015-07-20 (×2): qty 2
  Filled 2015-07-20: qty 1
  Filled 2015-07-20: qty 2
  Filled 2015-07-20: qty 1
  Filled 2015-07-20: qty 2
  Filled 2015-07-20: qty 1
  Filled 2015-07-20: qty 2
  Filled 2015-07-20: qty 1
  Filled 2015-07-20 (×2): qty 2
  Filled 2015-07-20: qty 1
  Filled 2015-07-20 (×2): qty 2
  Filled 2015-07-20 (×4): qty 1
  Filled 2015-07-20 (×3): qty 2
  Filled 2015-07-20 (×2): qty 1
  Filled 2015-07-20 (×4): qty 2
  Filled 2015-07-20: qty 1
  Filled 2015-07-20 (×2): qty 2
  Filled 2015-07-20: qty 1
  Filled 2015-07-20: qty 2
  Filled 2015-07-20 (×8): qty 1
  Filled 2015-07-20 (×2): qty 2
  Filled 2015-07-20: qty 1

## 2015-07-20 MED ORDER — MAGNESIUM SULFATE 4 GM/100ML IV SOLN
4.0000 g | Freq: Once | INTRAVENOUS | Status: AC
  Administered 2015-07-20: 4 g via INTRAVENOUS
  Filled 2015-07-20: qty 100

## 2015-07-20 MED ORDER — PANTOPRAZOLE SODIUM 40 MG PO PACK
40.0000 mg | PACK | Freq: Every day | ORAL | Status: DC
  Administered 2015-07-21 – 2015-08-16 (×27): 40 mg via ORAL
  Filled 2015-07-20 (×28): qty 20

## 2015-07-20 MED ORDER — MIRTAZAPINE 7.5 MG PO TABS
7.5000 mg | ORAL_TABLET | Freq: Every evening | ORAL | Status: DC | PRN
  Filled 2015-07-20: qty 1

## 2015-07-20 MED ORDER — OXYCODONE HCL 5 MG/5ML PO SOLN
20.0000 mg | ORAL | Status: DC | PRN
  Administered 2015-07-21 – 2015-08-16 (×69): 20 mg via ORAL
  Filled 2015-07-20 (×71): qty 20

## 2015-07-20 MED ORDER — BACLOFEN 20 MG PO TABS
20.0000 mg | ORAL_TABLET | Freq: Every day | ORAL | Status: DC
  Administered 2015-07-20 – 2015-08-15 (×27): 20 mg via ORAL
  Filled 2015-07-20 (×2): qty 1
  Filled 2015-07-20: qty 2
  Filled 2015-07-20 (×24): qty 1
  Filled 2015-07-20: qty 2
  Filled 2015-07-20 (×2): qty 1

## 2015-07-20 MED ORDER — MIDODRINE HCL 5 MG PO TABS
30.0000 mg | ORAL_TABLET | Freq: Three times a day (TID) | ORAL | Status: DC
  Administered 2015-07-20 – 2015-08-16 (×76): 30 mg via ORAL
  Filled 2015-07-20 (×53): qty 6
  Filled 2015-07-20: qty 12
  Filled 2015-07-20 (×2): qty 6
  Filled 2015-07-20: qty 12
  Filled 2015-07-20 (×21): qty 6

## 2015-07-20 MED ORDER — ADULT MULTIVITAMIN W/MINERALS CH
1.0000 | ORAL_TABLET | Freq: Every day | ORAL | Status: DC
  Administered 2015-07-21 – 2015-08-16 (×27): 1 via ORAL
  Filled 2015-07-20 (×27): qty 1

## 2015-07-20 MED ORDER — ACETAMINOPHEN 650 MG RE SUPP
650.0000 mg | Freq: Four times a day (QID) | RECTAL | Status: DC | PRN
  Administered 2015-07-23: 650 mg via RECTAL

## 2015-07-20 MED ORDER — BACLOFEN 10 MG PO TABS
10.0000 mg | ORAL_TABLET | ORAL | Status: DC
  Administered 2015-07-20 – 2015-08-16 (×49): 10 mg via ORAL
  Filled 2015-07-20 (×45): qty 1

## 2015-07-20 MED ORDER — PREGABALIN 50 MG PO CAPS
100.0000 mg | ORAL_CAPSULE | ORAL | Status: DC
  Administered 2015-07-20 – 2015-08-16 (×75): 100 mg via ORAL
  Filled 2015-07-20 (×24): qty 2
  Filled 2015-07-20: qty 1
  Filled 2015-07-20 (×2): qty 2
  Filled 2015-07-20: qty 1
  Filled 2015-07-20 (×3): qty 2
  Filled 2015-07-20: qty 1
  Filled 2015-07-20: qty 2
  Filled 2015-07-20 (×2): qty 1
  Filled 2015-07-20 (×2): qty 2
  Filled 2015-07-20: qty 1
  Filled 2015-07-20 (×37): qty 2
  Filled 2015-07-20: qty 1
  Filled 2015-07-20 (×2): qty 2

## 2015-07-20 NOTE — Progress Notes (Signed)
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 (Active)  Dressing Type ABD;Barrier Film (skin prep);Gauze (Comment);Moist to dry;Tape dressing 07/20/2015 11:27 AM  Dressing Changed;Clean;Dry;Intact 07/20/2015 11:27 AM  Dressing Change Frequency Daily 07/20/2015 11:27 AM  State of Healing Early/partial granulation 07/20/2015 11:27 AM  Site / Wound Assessment Pink;Red;Yellow;Granulation tissue 07/20/2015 11:27 AM  % Wound base Red or Granulating 85% 07/20/2015 11:27 AM  % Wound base Yellow 15% 07/20/2015 11:27 AM  Peri-wound Assessment Intact 07/20/2015 11:27 AM  Wound Length (cm) 9.7 cm 07/20/2015 11:27 AM  Wound Width (cm) 7.4 cm 07/20/2015 11:27 AM  Wound Depth (cm) 1.4 cm 07/20/2015 11:27 AM  Undermining (cm) 9:00-5:00 1.8cm; at 12:00 4.0cm 07/20/2015 11:27 AM  Margins Unattached edges (unapproximated) 07/20/2015 11:27 AM  Drainage Amount Copious 07/20/2015 11:27 AM  Drainage Description Serosanguineous;Purulent;Odor 07/20/2015 11:27 AM  Treatment Cleansed;Debridement (Selective);Hydrotherapy (Pulse lavage);Packing (Saline gauze);Tape changed;Other (Comment) 07/20/2015 11:27 AM     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 (Active)  Dressing Type ABD;Barrier Film (skin prep);Gauze (Comment);Moist to dry;Tape dressing 07/20/2015 11:27 AM  Dressing Changed;Clean;Dry;Intact 07/20/2015 11:27 AM  Dressing Change Frequency Daily 07/20/2015 11:27 AM   State of Healing Early/partial granulation 07/20/2015 11:27 AM  Site / Wound Assessment Granulation tissue;Pink;Yellow;Red;Brown;Bleeding 07/20/2015 11:27 AM  % Wound base Red or Granulating 75% 07/20/2015 11:27 AM  % Wound base Yellow 5% 07/20/2015 11:27 AM  % Wound base Black 10% 07/20/2015 11:27 AM  % Wound base Other (Comment) 10% 07/20/2015 11:27 AM  Peri-wound Assessment Pink 07/20/2015 11:27 AM  Wound Length (cm) 9 cm 07/20/2015 11:27 AM  Wound Width (cm) 11.9 cm 07/20/2015 11:27 AM  Wound Depth (cm) 2.6 cm 07/20/2015 11:27 AM  Undermining (cm) 8:00 - 4:00 2.5cm 07/20/2015 11:27 AM  Margins Epibole (rolled edges) 07/20/2015 11:27 AM  Drainage Amount Copious 07/20/2015 11:27 AM  Drainage Description Serosanguineous;Odor;Purulent;Sanguineous 07/20/2015 11:27 AM  Treatment Debridement (Selective);Hydrotherapy (Pulse lavage);Packing (Saline gauze) 07/18/2015 12:21 PM     Santyl applied to wound bed prior to applying dressing.  Hydrotherapy Pulsed lavage therapy - wound location: Lt ischial tuberosity and sacrum Pulsed Lavage with Suction (psi): 8 psi Pulsed Lavage with Suction - Normal Saline Used: 1000 mL Pulsed Lavage Tip: Tip with splash shield Selective Debridement Selective Debridement - Location: Lt ischial tuberosity and Sacrum Selective Debridement - Tools Used: Forceps;Scalpel Selective Debridement - Tissue Removed: Yellow slough, Brown/black eschar   Wound Assessment and Plan  Wound Therapy - Assess/Plan/Recommendations Wound Therapy - Clinical Statement: More drainage noted today. Further non-viable tissue removed from central and perimeter eschar. From hydrotherapy standpoint pt can return home with dressing changes. Wound Therapy - Functional Problem List: Pt unable to tolerate prolonged sitting due to pressure ulcers over sacrum and ischial tuberosities Factors Delaying/Impairing Wound Healing: Incontinence;Infection - systemic/local;Polypharmacy;Diabetes  Mellitus;Altered sensation;Immobility;Multiple medical problems Hydrotherapy Plan: Debridement;Dressing change;Patient/family education;Pulsatile lavage with suction Wound Therapy - Frequency: 6X / week Wound Therapy - Follow Up Recommendations: Home health RN Wound Plan: see above  Wound Therapy  Goals- Improve the function of patient's integumentary system by progressing the wound(s) through the phases of wound healing (inflammation - proliferation - remodeling) by: Decrease Necrotic Tissue to: 10 Decrease Necrotic Tissue - Progress: Progressing toward goal Increase Granulation Tissue to: 90 Increase Granulation Tissue - Progress: Progressing toward goal Improve Drainage Characteristics: Min Improve Drainage Characteristics - Progress: Not progressing  Goals will be updated until maximal potential achieved or discharge criteria met.  Discharge criteria: when goals achieved, discharge from hospital, MD decision/surgical intervention, no progress towards goals, refusal/missing three consecutive treatments without notification or medical reason.  GP     Candie Mile S 07/20/2015, 11:37 AM   Elayne Snare, [place]

## 2015-07-20 NOTE — Progress Notes (Signed)
Inpatient Diabetes Program Recommendations  AACE/ADA: New Consensus Statement on Inpatient Glycemic Control (2015)  Target Ranges:  Prepandial:   less than 140 mg/dL      Peak postprandial:   less than 180 mg/dL (1-2 hours)      Critically ill patients:  140 - 180 mg/dL   Review of Glycemic Control:  Results for WORLEY, RADERMACHER (MRN [id number]) as of 07/20/2015 09:31  Ref. Range 07/19/2015 20:58 07/20/2015 00:06 07/20/2015 00:51 07/20/2015 04:20 07/20/2015 07:40  Glucose-Capillary Latest Ref Range: 65-99 mg/dL 123 (H) 179 (H) 168 (H) 152 (H) 147 (H)    Diabetes history: Type 1 diabetes  Inpatient Diabetes Program Recommendations:    Note hypoglycemia yesterday.  Patient is now on nocturnal feeds which increases blood sugars at night however during day blood sugars decrease.  Spoke with Dr. Thereasa Solo.  He states he will decrease Lantus back to 12 units.  RN states that she did not cover CBG this morning due to feeds being stopped.  May need less aggressive correction scale during the day since tube feeds are off.  Thanks, Adah Perl, RN, BC-ADM Inpatient Diabetes Coordinator Pager [phone] (8a-5p)

## 2015-07-20 NOTE — Progress Notes (Signed)
Speech pathology  Pt with much improved swallow function  - see MBS under imaging.  Recommend initiating regular diet, thin liquids; staff assist with feeding.  Jalei Shibley L. Tivis Ringer, [place] CCC/SLP Pager [phone]

## 2015-07-20 NOTE — Progress Notes (Signed)
[facility] TEAM 1 - Stepdown/ICU TEAM PROGRESS NOTE  GRANGER CHUI XKG:[id number] DOB: [birth year] DOA: 07/10/2015 PCP: [name], MD  Admit HPI / Brief Narrative: 29 y.o. 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 Patient was recently tapered off his IV antibiotics and was placed on doxycycline and Cipro 3 days prior to this admit for possible UTI.   In the ER patient UA shows features concerning for UTI - patient has chronic suprapubic catheter. Patient was also found to be hypothermic and hypotensive. Patient has had previous history of multiple debridement for chronic sacral decubitus which at this time looks to be healing. Patient also has history of chronic osteomyelitis.   HPI/Subjective: The patient is comfortable in bed.  He denies pain at the present time.  He is tolerating his newly liberalized diet without any difficulty.  He denies shortness of breath or chest pain.  Assessment/Plan:  Toxic metabolic encephalopathy Resolved  Sepsis due to MDR Pseudomonas Aeruginosa UTI  Status post third dose of fosfomycin 10/10 per ID recommendations - no further tx planned at this time   Acute renal failure Due to sepsis - renal function has now normalized  Hyponatremia  due to hypovolemia - resolved w/ volume resuscitation    Hypomagnesemia  Replace and f/u in AM  Diabetes mellitus type 1 with hyperglycemia CBGs have been erratic but appeared to be stabilizing some - follow today without further change - may ultimately require separate scale for use at night while on tube feeding  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 cont hydrotherapy to wounds as per general surgery recommendations - antibiotic course  completed for possible superinfection per ID  Chronic anemia Hgb initially dropped w/ volume expansion - s/p 2U PRBC - no evidence of acute blood loss - hemoglobin appears stable at this time   Chronic respiratory failure with trach and vent pulmonary critical care supposed to be managing vent - it appears the patient took himself off the vent 10/7 and has not been back on since - currently appears stable on 28% trach collar   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 IVC filter placed 8/6  History of spinal canal injury with paraplegia  Severe malnutrition in context of chronic illness, Underweight patient now able to eat and cleared for regular diet - transitioned to nocturnal tube feeding regimen  Code Status: FULL Family Communication: no family present at time of exam today  Disposition Plan: SDU   Consultants: PCCM - vent management   Procedures: none  Antibiotics: Imipenem 10/2 > 10/11 Vanc 10/2 > 10/11 Fosfomycin 10/6 > 10/10  DVT prophylaxis: lovenox   Objective: Blood pressure 101/68, pulse 77, temperature 98.2 F (36.8 C), temperature source Axillary, resp. rate 17, height 5\' 7"  (1.702 m), weight 49.5 kg (109 lb 2 oz), SpO2 99 %.  Intake/Output Summary (Last 24 hours) at 07/20/15 1526 Last data filed at 07/20/15 1200  Gross per 24 hour  Intake   2610 ml  Output    650 ml  Net   1960 ml   Exam: General: No acute respiratory distress - respirations non-labored on RA  Lungs: Clear to auscultation bilaterally  Cardiovascular: Regular rate and rhythm - no murmur    Abdomen: Nontender, nondistended, soft, bowel sounds positive, no rebound,  no ascites, no appreciable mass Extremities: No significant cyanosis, clubbing, edema bilateral lower extremities - muscle wasting B LE   Data Reviewed: Basic Metabolic Panel:  Recent Labs Lab 07/15/15 1034 07/16/15 0259 07/17/15 0111 07/18/15 0346 07/19/15 0038 07/20/15 0314  NA 141 144 141 136 127* 132*  K 4.4 3.7 3.8 4.7 4.1  3.7  CL 116* 116* 112* 108 100* 101  CO2 17* 21* 19* 20* 20* 24  GLUCOSE 203* 106* 89 438* 430* 119*  BUN 35* 27* 21* 19 18 14   CREATININE 0.64 0.52* 0.38* 0.52* 0.69 0.55*  CALCIUM 9.5 9.7 9.5 9.3 8.4* 8.8*  MG 1.3* 1.7 1.4* 1.2*  --  1.1*    CBC:  Recent Labs Lab 07/15/15 1034 07/16/15 0259 07/17/15 0111 07/18/15 0346 07/19/15 0038 07/20/15 0314  WBC 25.8* 19.4* 24.9* 24.4* 24.6* 21.3*  NEUTROABS 20.1* 13.1* 17.6* 18.9*  --  15.3*  HGB 11.2* 10.7* 11.4* 11.2* 10.9* 10.9*  HCT 35.0* 33.9* 35.6* 34.9* 34.0* 34.3*  MCV 86.4 87.4 87.0 88.6 88.3 86.4  PLT 534* 506* 534* 394 412* 415*    Liver Function Tests:  Recent Labs Lab 07/15/15 1034 07/16/15 0259 07/17/15 0111 07/18/15 0346 07/20/15 0314  AST 12* 13* 24 17 33  ALT 9* 10* 13* 11* 19  ALKPHOS 132* 137* 128* 122 114  BILITOT 0.3 0.4 0.2* 0.8 0.2*  PROT 6.5 6.5 6.9 6.3* 6.4*  ALBUMIN 1.6* 1.6* 1.7* 1.7* 1.6*   CBG:  Recent Labs Lab 07/20/15 0051 07/20/15 0420 07/20/15 0740 07/20/15 0914 07/20/15 1155  GLUCAP 168* 152* 147* 141* 179*    Recent Results (from the past 240 hour(s))  Blood culture (routine x 2)     Status: None   Collection Time: 07/10/15  4:25 PM  Result Value Ref Range Status   Specimen Description BLOOD LEFT ARM  Final   Special Requests BOTTLES DRAWN AEROBIC AND ANAEROBIC 5CC  Final   Culture NO GROWTH 5 DAYS  Final   Report Status 07/15/2015 FINAL  Final  MRSA PCR Screening     Status: None   Collection Time: 07/11/15  1:00 AM  Result Value Ref Range Status   MRSA by PCR NEGATIVE NEGATIVE Final    Comment:        The GeneXpert MRSA Assay (FDA approved for NASAL specimens only), is one component of a comprehensive MRSA colonization surveillance program. It is not intended to diagnose MRSA infection nor to guide or monitor treatment for MRSA infections.   Blood culture (routine x 2)     Status: None   Collection Time: 07/11/15  4:19 AM  Result Value Ref Range Status    Specimen Description BLOOD RIGHT FOREARM  Final   Special Requests BOTTLES DRAWN AEROBIC ONLY 2CC  Final   Culture NO GROWTH 5 DAYS  Final   Report Status 07/16/2015 FINAL  Final  Urine culture     Status: None (Preliminary result)   Collection Time: 07/11/15  5:09 PM  Result Value Ref Range Status   Specimen Description URINE, RANDOM  Final   Special Requests NONE  Final   Culture   Final    >=100,000 COLONIES/mL PSEUDOMONAS AERUGINOSA AMIKACIN MIC=8 SENSITIVE >=100,000 COLONIES/mL YEAST    Report Status PENDING  Incomplete   Organism ID, Bacteria PSEUDOMONAS AERUGINOSA  Final      Susceptibility   Pseudomonas aeruginosa - MIC*    CEFTAZIDIME 8 SENSITIVE Sensitive     CIPROFLOXACIN >=4 RESISTANT Resistant     GENTAMICIN >=  16 RESISTANT Resistant     IMIPENEM >=16 RESISTANT Resistant     CEFEPIME 16 INTERMEDIATE Intermediate     AZTREONAM Value in next row       NOT DONERESISTANT    * >=100,000 COLONIES/mL PSEUDOMONAS AERUGINOSA     Studies:   Recent x-ray studies have been reviewed in detail by the Attending Physician  Scheduled Meds:  Scheduled Meds: . antiseptic oral rinse  7 mL Mouth Rinse QID  . baclofen  10 mg Per Tube 2 times per day  . baclofen  20 mg Per Tube QHS  . chlorhexidine gluconate  15 mL Mouth Rinse BID  . collagenase   Topical Daily  . enoxaparin (LOVENOX) injection  30 mg Subcutaneous Daily  . feeding supplement (VITAL AF 1.2 CAL)  1,000 mL Per Tube Q24H  . fentaNYL  50 mcg Transdermal Q72H  . Influenza vac split quadrivalent PF  0.5 mL Intramuscular Tomorrow-1000  . insulin aspart  0-15 Units Subcutaneous 6 times per day  . insulin glargine  12 Units Subcutaneous Daily  . midodrine  30 mg Per Tube TID WC  . multivitamin with minerals  1 tablet Per Tube Daily  . pantoprazole sodium  40 mg Per Tube Daily  . pregabalin  100 mg Per Tube 3 times per day  . pregabalin  200 mg Per Tube QHS    Time spent on care of this patient: 25  mins   [facility] T , MD   [facility] Office  [phone] Pager - Text Page per Shea Evans as per below:  On-Call/Text Page:      [email or web address]      password [email or web address]  If 7PM-7AM, please contact night-coverage [email or web address] Password [email or web address] 07/20/2015, 3:26 PM   LOS: 10 days

## 2015-07-21 ENCOUNTER — Encounter: Payer: Self-pay | Admitting: Internal Medicine

## 2015-07-21 DIAGNOSIS — M8660 Other chronic osteomyelitis, unspecified site: Secondary | ICD-10-CM

## 2015-07-21 LAB — CBC
HCT: 32.1 % — ABNORMAL LOW (ref 39.0–52.0)
HEMOGLOBIN: 10.2 g/dL — AB (ref 13.0–17.0)
MCH: 27.3 pg (ref 26.0–34.0)
MCHC: 31.8 g/dL (ref 30.0–36.0)
MCV: 85.8 fL (ref 78.0–100.0)
Platelets: 374 10*3/uL (ref 150–400)
RBC: 3.74 MIL/uL — AB (ref 4.22–5.81)
RDW: 15.5 % (ref 11.5–15.5)
WBC: 23.5 10*3/uL — ABNORMAL HIGH (ref 4.0–10.5)

## 2015-07-21 LAB — MAGNESIUM: Magnesium: 2 mg/dL (ref 1.7–2.4)

## 2015-07-21 LAB — BASIC METABOLIC PANEL
Anion gap: 6 (ref 5–15)
BUN: 14 mg/dL (ref 6–20)
CO2: 22 mmol/L (ref 22–32)
CREATININE: 0.5 mg/dL — AB (ref 0.61–1.24)
Calcium: 8.5 mg/dL — ABNORMAL LOW (ref 8.9–10.3)
Chloride: 103 mmol/L (ref 101–111)
GFR calc Af Amer: >60 mL/min (ref >60)
GLUCOSE: 135 mg/dL — AB (ref 65–99)
POTASSIUM: 4.7 mmol/L (ref 3.5–5.1)
SODIUM: 131 mmol/L — AB (ref 135–145)

## 2015-07-21 LAB — GLUCOSE, CAPILLARY
GLUCOSE-CAPILLARY: 159 mg/dL — AB (ref 65–99)
GLUCOSE-CAPILLARY: 162 mg/dL — AB (ref 65–99)
GLUCOSE-CAPILLARY: 187 mg/dL — AB (ref 65–99)
GLUCOSE-CAPILLARY: 197 mg/dL — AB (ref 65–99)
GLUCOSE-CAPILLARY: 279 mg/dL — AB (ref 65–99)
Glucose-Capillary: 174 mg/dL — ABNORMAL HIGH (ref 65–99)

## 2015-07-21 MED ORDER — INSULIN GLARGINE 100 UNIT/ML ~~LOC~~ SOLN
15.0000 [IU] | Freq: Every day | SUBCUTANEOUS | Status: DC
  Administered 2015-07-22 – 2015-07-23 (×2): 15 [IU] via SUBCUTANEOUS
  Filled 2015-07-21 (×2): qty 0.15

## 2015-07-21 NOTE — Progress Notes (Signed)
[place] TEAM 1 - Stepdown/ICU TEAM Progress Note  Jason [name] YPP:[id number] DOB: 1986/09/16 DOA: 07/10/2015 PCP: [name], MD  Admit HPI / Brief Narrative: Jason [name] is a 29 y.o. BM PMHx Spinal cord injury status post paraplegia who was recently admitted for MRSA and microaerophilic strep bacteremia with respiratory failure requiring trach and is the patient is on vent in the nights was brought to the ER after patient was found to have a hallucination and confusion as per patient's mother. Patient was recently tapered off his IV antibiotics and was placed on doxycycline and Cipro was started 3 days ago for possible UTI.   Over the last 3 days patient was noticed to have increasing confusion and hallucination as per patient's mother. In the ER patient UA shows features concerning for UTI and patient has chronic suprapubic catheter. Patient is also found to be hypothermic and was also hypotensive which improved with fluid bolus. Patient has had previous history of multiple debridement for chronic sacral decubitus which at this time looks healing. Patient also has history of chronic osteomyelitis. Patient has been admitted for possible developing sepsis. On call pulmonary critical care has been consulted and patient has been admitted for further management.  HPI/Subjective: 10/13  MAXIMUM TEMPERATURE overnight 38C  A/O 4,  sitting in bed comfortably. States negative SOB, negative CP, negative C/S.   Assessment/Plan: Toxic metabolic encephalopathy -Mental status at baseline  Sepsis due to UTI positive MDR Pseudomonas Aeruginosa(cause/exacerbated by chronic indwelling cath)  -Completed 10 day course of vancomycin and imipenem -Now will monitor patient off antibiotics -blood cultures negative -10/13 suprapubic cath will be replaced     Acute renal failure -Due to sepsis resolved  Hyponatremia  -normal saline 30 ml/hr -Resolved   Diabetes mellitus type 1 with  hyperglycemia - Increase Lantus 15 units daily -Continue moderate SSI   Sacral decubitus/bilateral ischeal ulcers and left chest wall decubitus (osteomyelitis) -10/13 Reconsult surgery on Saturday or Sunday after completing a few more days of hydrotherapy per their last recommendation.  -[place] Bed maintain in rotational mode  Chronic anemia -Transfuse for hemoglobin <7  -10/4 Transfuse 2 units PRBC  -Stable  Chronic respiratory failure with trach and vent -pulmonary critical care following and managing vent  -Patient did not require vent overnight  Hypomagnesemia -WNL   History of spinal cord injury with paraplegia  Nutrition -Passed MBS, continue dysphagia 2 diet honey thick liquid   Goals of care -Palliative care consult placed. Patient with spinal cord injury resulting in paraplegia, indwelling cath with multiple infections, nonhealing multiple decubitus ulcers to sacrum and bilateral buttocks resulting in frequent hospitalization; short-term vs long-term goals of care; patient wishes to continue full code and current course of treatment   Code Status: FULL Family Communication: None  Disposition Plan: [place]     Consultants: PCCM - vent management   Procedure/Significant Events:    Culture 10/2 blood left arm negative final 10/3 blood right forearm negative final 10/3 urine positive MDR Pseudomonas Aeruginosa  10/3 MRSA by PCR negative  Antibiotics: IImipenem 10/2 > 10/11 Vanc 10/2 > 10/11 Fosfomycin 10/6 > 10/10   DVT prophylaxis: lovenox    Devices    LINES / TUBES:      Continuous Infusions: . sodium chloride 30 mL/hr at 07/20/15 1743    Objective: VITAL SIGNS: Temp: 98.7 F (37.1 C) (10/13 0737) Temp Source: Oral (10/13 0737) BP: 107/64 mmHg (10/13 0737) Pulse Rate: 77 (10/13 0737) SPO2; FIO2:   Intake/Output Summary (Last  24 hours) at 07/21/15 0802 Last data filed at 07/21/15 0700  Gross per 24 hour  Intake 734.33 ml    Output   2400 ml  Net -1665.67 ml     Exam: General: A/O 4, NAD, No acute respiratory distress Eyes: Negative headache, eye pain, double vision,negative scleral hemorrhage ENT: Negative Runny nose, negative ear pain, negative gingival bleeding, Neck:  Negative scars, masses, torticollis, lymphadenopathy, JVD Lungs: Clear to auscultation bilaterally without wheezes or crackles Cardiovascular: Regular rate and rhythm without murmur gallop or rub normal S1 and S2 Abdomen:negative abdominal pain, nondistended, positive soft, bowel sounds, no rebound, no ascites, no appreciable mass, suprapubic catheter in place negative discharge or sign of infection Extremities: atrophy, large stage 4 sacral decubitus ulcer, bilateral ischeal stage IV decubitus ulcers, unstageable left heel and toe ulcer, covered and clean Psychiatric:  Negative depression, negative anxiety, negative fatigue, negative mania  Neurologic:  Cranial nerves II through XII intact, tongue/uvula midline, bilateral upper extremity gross muscles strength 2/5, fine motor skill 0/5, bilateral lower extremity paralysis negative dysarthria, negative expressive aphasia, negative receptive aphasia.   Data Reviewed: Basic Metabolic Panel:  Recent Labs Lab 07/16/15 0259 07/17/15 0111 07/18/15 0346 07/19/15 0038 07/20/15 0314 07/21/15 0339  NA 144 141 136 127* 132* 131*  K 3.7 3.8 4.7 4.1 3.7 4.7  CL 116* 112* 108 100* 101 103  CO2 21* 19* 20* 20* 24 22  GLUCOSE 106* 89 438* 430* 119* 135*  BUN 27* 21* 19 18 14 14   CREATININE 0.52* 0.38* 0.52* 0.69 0.55* 0.50*  CALCIUM 9.7 9.5 9.3 8.4* 8.8* 8.5*  MG 1.7 1.4* 1.2*  --  1.1* 2.0   Liver Function Tests:  Recent Labs Lab 07/15/15 1034 07/16/15 0259 07/17/15 0111 07/18/15 0346 07/20/15 0314  AST 12* 13* 24 17 33  ALT 9* 10* 13* 11* 19  ALKPHOS 132* 137* 128* 122 114  BILITOT 0.3 0.4 0.2* 0.8 0.2*  PROT 6.5 6.5 6.9 6.3* 6.4*  ALBUMIN 1.6* 1.6* 1.7* 1.7* 1.6*   No  results for input(s): LIPASE, AMYLASE in the last 168 hours. No results for input(s): AMMONIA in the last 168 hours. CBC:  Recent Labs Lab 07/15/15 1034 07/16/15 0259 07/17/15 0111 07/18/15 0346 07/19/15 0038 07/20/15 0314 07/21/15 0339  WBC 25.8* 19.4* 24.9* 24.4* 24.6* 21.3* 23.5*  NEUTROABS 20.1* 13.1* 17.6* 18.9*  --  15.3*  --   HGB 11.2* 10.7* 11.4* 11.2* 10.9* 10.9* 10.2*  HCT 35.0* 33.9* 35.6* 34.9* 34.0* 34.3* 32.1*  MCV 86.4 87.4 87.0 88.6 88.3 86.4 85.8  PLT 534* 506* 534* 394 412* 415* 374   Cardiac Enzymes: No results for input(s): CKTOTAL, CKMB, CKMBINDEX, TROPONINI in the last 168 hours. BNP (last 3 results) No results for input(s): BNP in the last 8760 hours.  ProBNP (last 3 results) No results for input(s): PROBNP in the last 8760 hours.  CBG:  Recent Labs Lab 07/20/15 1155 07/20/15 1606 07/20/15 2017 07/20/15 2357 07/21/15 0450  GLUCAP 179* 187* 230* 279* 174*    Recent Results (from the past 240 hour(s))  Urine culture     Status: None (Preliminary result)   Collection Time: 07/11/15  5:09 PM  Result Value Ref Range Status   Specimen Description URINE, RANDOM  Final   Special Requests NONE  Final   Culture   Final    >=100,000 COLONIES/mL PSEUDOMONAS AERUGINOSA AMIKACIN MIC=8 SENSITIVE >=100,000 COLONIES/mL YEAST    Report Status PENDING  Incomplete   Organism ID, Bacteria PSEUDOMONAS AERUGINOSA  Final      Susceptibility   Pseudomonas aeruginosa - MIC*    CEFTAZIDIME 8 SENSITIVE Sensitive     CIPROFLOXACIN >=4 RESISTANT Resistant     GENTAMICIN >=16 RESISTANT Resistant     IMIPENEM >=16 RESISTANT Resistant     CEFEPIME 16 INTERMEDIATE Intermediate     AZTREONAM Value in next row       NOT DONERESISTANT    * >=100,000 COLONIES/mL PSEUDOMONAS AERUGINOSA     Studies:  Recent x-ray studies have been reviewed in detail by the Attending Physician  Scheduled Meds:  Scheduled Meds: . antiseptic oral rinse  7 mL Mouth Rinse QID  .  baclofen  10 mg Oral 2 times per day  . baclofen  20 mg Oral QHS  . chlorhexidine gluconate  15 mL Mouth Rinse BID  . collagenase   Topical Daily  . enoxaparin (LOVENOX) injection  30 mg Subcutaneous Daily  . feeding supplement (VITAL AF 1.2 CAL)  1,000 mL Per Tube Q24H  . fentaNYL  50 mcg Transdermal Q72H  . Influenza vac split quadrivalent PF  0.5 mL Intramuscular Tomorrow-1000  . insulin aspart  0-15 Units Subcutaneous 6 times per day  . insulin glargine  12 Units Subcutaneous Daily  . midodrine  30 mg Oral TID WC  . multivitamin with minerals  1 tablet Oral Daily  . pantoprazole sodium  40 mg Oral Daily  . pregabalin  100 mg Oral 3 times per day  . pregabalin  200 mg Oral QHS    Time spent on care of this patient: 40 mins   Elbia Paro, Geraldo Docker , MD  [facility] Office  [phone] Pager [phone]  On-Call/Text Page:      [email or web address]      password [email or web address]  If 7PM-7AM, please contact night-coverage [email or web address] Password [email or web address] 07/21/2015, 8:02 AM   LOS: 11 days   Care during the described time interval was provided by me .  I have reviewed this patient's available data, including medical history, events of note, physical examination, and all test results as part of my evaluation. I have personally reviewed and interpreted all radiology studies.   Dia Crawford, MD [phone] Pager

## 2015-07-21 NOTE — Progress Notes (Signed)
Speech Language Pathology Treatment: Dysphagia  Patient Details Name: Jason [name] MRN: [id number] DOB: [birth year] Today's Date: 07/21/2015 Time: 9476-5465 SLP Time Calculation (min) (ACUTE ONLY): 15 min  Assessment / Plan / Recommendation Clinical Impression  Swallow intervention following MBS yesterday. Delayed cough x 2 following straw sips thin, questionable penetration (?). SLP provided verbal education re: clinical reasoning to use extra caution with straw. He would benefit from one more session to ensure safety and tolerance of diet.    HPI Other Pertinent Information: 29 y.o. male with history of spinal cord injury status post paraplegia who was recently admitted for MRSA and microaerophilic strep bacteremia with respiratory failure requiring trach; nocturnal vent. Admitted due to AMS. Found to have UTI, hypothermia, hypotension, toxic metabolic encephalopathy. In August 2016 pt seen for in-line Passy-Muir Speaking valve, trach'd and continued with PMSV. MBS 05/2015 recommended NPO, received PEG. MBS 10/7 with recs for dysphagia 2, honey thick secondary to silent, deep penetration nectars; mild oral phase deficits.  Pt has been tolerating current diet with valve in place for all POs.    Pertinent Vitals Pain Assessment: No/denies pain  SLP Plan  Continue with current plan of care    Recommendations Diet recommendations: Regular;Thin liquid Liquids provided via: Straw Medication Administration: Whole meds with puree Supervision: Staff to assist with self feeding;Intermittent supervision to cue for compensatory strategies Compensations: Slow rate;Small sips/bites Postural Changes and/or Swallow Maneuvers: Seated upright 90 degrees              Oral Care Recommendations: Oral care BID Follow up Recommendations: None Plan: Continue with current plan of care    GO     [name] 07/21/2015, 1:57 PM

## 2015-07-21 NOTE — Progress Notes (Signed)
Physical Therapy Wound Treatment Patient Details  Name: Jason [name] MRN: [id number] Date of Birth: 1985/11/30  Today's Date: 07/21/2015 Time: 1100-1147 Time Calculation (min): 47 min  Subjective  Subjective: "Just watching movies" Patient and Family Stated Goals: None stated Date of Onset:  (Present on admission) Prior Treatments: Has received hydrotherapy on previous admission  Pain Score:   3/10 back.  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 (Active)  Dressing Type ABD;Barrier Film (skin prep);Gauze (Comment);Moist to dry;Tape dressing 07/21/2015 11:51 AM  Dressing Changed;Clean;Dry;Intact 07/21/2015 11:51 AM  Dressing Change Frequency Daily 07/21/2015 11:51 AM  State of Healing Early/partial granulation 07/21/2015 11:51 AM  Site / Wound Assessment Pink;Red;Yellow;Granulation tissue 07/21/2015 11:51 AM  % Wound base Red or Granulating 85% 07/21/2015 11:51 AM  % Wound base Yellow 15% 07/21/2015 11:51 AM  Peri-wound Assessment Intact 07/21/2015 11:51 AM  Wound Length (cm) 9.7 cm 07/20/2015 11:27 AM  Wound Width (cm) 7.4 cm 07/20/2015 11:27 AM  Wound Depth (cm) 1.4 cm 07/20/2015 11:27 AM  Undermining (cm) 9:00-5:00 1.8cm; at 12:00 4.0cm 07/20/2015 11:27 AM  Margins Unattached edges (unapproximated) 07/21/2015 11:51 AM  Drainage Amount Copious 07/21/2015 11:51 AM  Drainage Description Serosanguineous;Purulent;Odor 07/21/2015 11:51 AM  Treatment Cleansed;Debridement (Selective);Hydrotherapy (Pulse lavage);Packing (Saline gauze);Tape changed;Other (Comment) 07/21/2015 11:51 AM     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 (Active)  Dressing Type ABD;Barrier Film (skin prep);Gauze (Comment);Moist to dry;Tape dressing;Other (Comment) 07/21/2015 11:51 AM  Dressing Changed;Clean;Dry;Intact 07/21/2015 11:51 AM  Dressing Change Frequency  Daily 07/21/2015 11:51 AM  State of Healing Early/partial granulation 07/21/2015 11:51 AM  Site / Wound Assessment Granulation tissue;Pink;Yellow;Red;Brown;Bleeding 07/21/2015 11:51 AM  % Wound base Red or Granulating 75% 07/21/2015 11:51 AM  % Wound base Yellow 10% 07/21/2015 11:51 AM  % Wound base Black 5% 07/21/2015 11:51 AM  % Wound base Other (Comment) 10% 07/21/2015 11:51 AM  Peri-wound Assessment Pink 07/21/2015 11:51 AM  Wound Length (cm) 9 cm 07/20/2015 11:27 AM  Wound Width (cm) 11.9 cm 07/20/2015 11:27 AM  Wound Depth (cm) 2.6 cm 07/20/2015 11:27 AM  Undermining (cm) 8:00 - 4:00 2.5cm 07/20/2015 11:27 AM  Margins Epibole (rolled edges) 07/21/2015 11:51 AM  Drainage Amount Copious 07/21/2015 11:51 AM  Drainage Description Serosanguineous;Odor;Purulent;Sanguineous 07/21/2015 11:51 AM  Treatment Cleansed;Debridement (Selective);Hydrotherapy (Pulse lavage);Packing (Saline gauze);Off loading;Tape changed;Other (Comment) 07/21/2015 11:51 AM     Santyl applied to wound bed prior to applying dressing.  Hydrotherapy Pulsed lavage therapy - wound location: Lt ischial tuberosity and sacrum Pulsed Lavage with Suction (psi): 8 psi Pulsed Lavage with Suction - Normal Saline Used: 1000 mL Pulsed Lavage Tip: Tip with splash shield Selective Debridement Selective Debridement - Location: Lt ischial tuberosity and Sacrum Selective Debridement - Tools Used: Scalpel;Scissors Selective Debridement - Tissue Removed: Yellow slough, Brown/black eschar   Wound Assessment and Plan  Wound Therapy - Assess/Plan/Recommendations Wound Therapy - Clinical Statement: Less hardened tissue today, able to remove additional non-viable eschar and slough. Still with copious drainage. Wound Therapy - Functional Problem List: Pt unable to tolerate prolonged sitting due to pressure ulcers over sacrum and ischial tuberosities Factors Delaying/Impairing Wound Healing: Incontinence;Infection -  systemic/local;Polypharmacy;Diabetes Mellitus;Altered sensation;Immobility;Multiple medical problems Hydrotherapy Plan: Debridement;Dressing change;Patient/family education;Pulsatile lavage with suction Wound Therapy - Frequency: 6X / week Wound Therapy - Follow Up Recommendations: Home health RN Wound Plan: see above  Wound Therapy Goals- Improve the function of patient's integumentary system by progressing the wound(s) through the phases of wound healing (inflammation - proliferation - remodeling) by: Decrease Necrotic Tissue to: 10 Decrease Necrotic Tissue - Progress: Progressing toward goal Increase Granulation Tissue to: 90 Increase Granulation Tissue - Progress: Progressing toward goal Improve Drainage Characteristics: Min Improve Drainage Characteristics - Progress: Not progressing  Goals will be updated until maximal potential achieved or discharge criteria met.  Discharge criteria: when goals achieved, discharge from hospital, MD decision/surgical intervention, no progress towards goals, refusal/missing three consecutive treatments without notification or medical reason.  GP     Candie Mile S 07/21/2015, 12:00 PM  Elayne Snare, [place]

## 2015-07-21 NOTE — Progress Notes (Signed)
10/13/2016[facility] Idol team resp consult note-- Pt currently on 28% aerosol trach collar this am.  Pt sitting up with passy-muir valve in place and eating breakfast.  Rn at bedside.  Pt vocalizing and tolerating valve well.  All supplies at bedside.  No needs noted at time of eval.  Will cont to follow progress.

## 2015-07-22 LAB — GLUCOSE, CAPILLARY
GLUCOSE-CAPILLARY: 219 mg/dL — AB (ref 65–99)
Glucose-Capillary: 154 mg/dL — ABNORMAL HIGH (ref 65–99)
Glucose-Capillary: 203 mg/dL — ABNORMAL HIGH (ref 65–99)
Glucose-Capillary: 232 mg/dL — ABNORMAL HIGH (ref 65–99)
Glucose-Capillary: 235 mg/dL — ABNORMAL HIGH (ref 65–99)
Glucose-Capillary: 270 mg/dL — ABNORMAL HIGH (ref 65–99)
Glucose-Capillary: 340 mg/dL — ABNORMAL HIGH (ref 65–99)

## 2015-07-22 MED ORDER — VITAL AF 1.2 CAL PO LIQD
1000.0000 mL | ORAL | Status: DC
  Administered 2015-07-23: 1000 mL
  Filled 2015-07-22 (×2): qty 1000
  Filled 2015-07-22: qty 237

## 2015-07-22 NOTE — Progress Notes (Signed)
Staff nse states may need vac. Have placed vac form on shadow chart for md to sign if needed.

## 2015-07-22 NOTE — Progress Notes (Addendum)
Nutrition Follow Up  DOCUMENTATION CODES:   Underweight, Severe malnutrition in context of chronic illness  INTERVENTION:    Continue Vital AF 1.2 formula at 60 ml/hr from 6 PM to 6 AM.  Total time is 12 hours.  TF regimen to provide 864 kcals, 54 gm protein, 584 ml of free water  NUTRITION DIAGNOSIS:   Inadequate oral intake now related to inability to eat as evidenced by  (PO intake </= 50%), ongoing  GOAL:   Patient will meet greater than or equal to 90% of their needs, met  MONITOR:   TF tolerance, PO intake, Labs, Weight trends, Skin, I & O's  ASSESSMENT:   [name] with PMH of spinal cord injury s/p paraplegia who was recently admitted for MRSA and microaerophilic strep bacteremia with respiratory failure requiring trach (vent at night); brought to ER after patient was found to have a hallucinations and confusion. Patient was recently tapered off his IV antibiotics and was placed on doxycycline and Cipro was started 3 days ago for possible UTI. In ER patient UA showed features concerning for UTI and patient has chronic suprapubic catheter. Patient has been admitted for possible developing sepsis.   Patient transferred out of SICU 10/5.  Continues on ATC.  Vital AF 1.2 formula infusing via PEG tube nocturnally (6 PM to 6 AM) providing 864 kcals, 54 gm protein, 584 ml of free water.  S/p MBSS 10/12.  Diet advanced to Regular, thin liquids.  PO intake remains at ~ 50% per flowsheet records.  Receiving hydrotherapy with PT.  Diet Order:  Diet regular Room service appropriate?: Yes; Fluid consistency:: Thin  Skin:      Left plantar heel with Stage 2 pressure injury Left plantar foot near heel with unstageable pressure injury Left anterior foot near small toe with deep tissue injury Right ischium with Stage 4 pressure injury Left ischium with Stage 4 pressure injury Left hip with Stage 4 pressure injury Left posterior back with chronic full thickness wound  Last  BM:  10/14  CBG (last 3)   Recent Labs  07/22/15 0013 07/22/15 0501 07/22/15 0815  GLUCAP 232* 340* 203*    Height:   Ht Readings from Last 1 Encounters:  07/13/15 5' 7"  (1.702 m)    Weight:   Wt Readings from Last 1 Encounters:  07/22/15 115 lb 4.8 oz (52.3 kg)   Wt Readings from Last 10 Encounters:  07/22/15 115 lb 4.8 oz (52.3 kg)  07/05/15 99 lb (44.906 kg)  06/01/15 111 lb (50.349 kg)  04/01/15 130 lb (58.968 kg)  02/20/15 111 lb 15.9 oz (50.8 kg)  02/16/15 112 lb (50.803 kg)  01/12/15 128 lb 4.9 oz (58.2 kg)  12/19/14 130 lb 1.1 oz (59 kg)  11/25/14 155 lb (70.308 kg)  11/19/14 155 lb 13.8 oz (70.7 kg)    Ideal Body Weight:  70 kg  BMI:  Body mass index is 18.05 kg/(m^2).  Re-estimated Nutritional Needs:   Kcal:  1700-1900  Protein:  90-100 gm  Fluid:  1.7-1.9 L  EDUCATION NEEDS:   No education needs identified at this time  Arthur Holms, RD, LDN Pager #: [phone] After-Hours Pager #: [phone]

## 2015-07-22 NOTE — Progress Notes (Signed)
Suprapubic cath changed by Stanford [place]. Patient tolerated procedure.

## 2015-07-22 NOTE — Progress Notes (Signed)
TEAM 1 - Stepdown/ICU TEAM PROGRESS NOTE  JOHNWILLIAM [name] ALP:[id number] DOB: 1985-12-11 DOA: 07/10/2015 PCP: [name], MD  Admit HPI / Brief Narrative: 29 y.o. 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 Patient was recently tapered off his IV antibiotics and was placed on doxycycline and Cipro 3 days prior to this admit for possible UTI.   In the ER patient UA shows features concerning for UTI - patient has chronic suprapubic catheter. Patient was also found to be hypothermic and hypotensive. Patient has had previous history of multiple debridement for chronic sacral decubitus which at this time looks to be healing. Patient also has history of chronic osteomyelitis.   HPI/Subjective: Pt had temp to 101.8 at 05:00 today.  He denies any complaints at the present time.  He is sitting up in bed enjoying his lunch with no difficulty.  It has been noted that his CBGs are well-controlled during the day but increased significantly at night when his tube feeding is initiated.  The patient asked if consideration could be given to be cannulating his trach.  Assessment/Plan:  Toxic metabolic encephalopathy Resolved - patient is presently alert and oriented 4  Sepsis due to MDR Pseudomonas Aeruginosa UTI  Status post third dose of fosfomycin 10/10 per ID recommendations - no further tx planned at this time - isolated fever early this morning somewhat worrisome - follow fever curve - suprapubic catheter to be changed today   Acute renal failure Due to sepsis - renal function has normalized  Hyponatremia  Due  initially  to hypovolemia - resolved w/ volume resuscitation but is now recurring - decrease tube feeding and follow    Hypomagnesemia  Replaced to goal of 2.0  Diabetes mellitus type 1 with hyperglycemia CBGs are peaking at night when tube feeding is begun and then improving during the day when patient is on oral diet - the patient's intake during the day was quite impressive - we will decrease his nightly tube feeding rate and follow CBG trend - he may require a specific separate nighttime sliding scale if this does not improve his CBG control  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 cont hydrotherapy to wounds as per general surgery recommendations - antibiotic course completed for possible superinfection per ID  Chronic anemia Hgb initially dropped w/ volume expansion - s/p 2U PRBC - no evidence of acute blood loss - hemoglobin appears stable at this time   Chronic respiratory failure with trach and vent pulmonary critical care supposed to be managing vent - it appears the patient took himself off the vent 10/7 and has not been back on since - currently appears stable on 28% trach collar - If PCCM does not visit patient today will reconsult them in next 24-48hrs to comment on possibility of decannulating  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 IVC filter placed 8/6  History of spinal canal injury with paraplegia  Severe malnutrition in context of chronic illness, Underweight patient now able to eat and cleared for regular diet - transitioned to nocturnal tube feeding regimen - decrease tube feed rate QHS as discussed above   Code Status: FULL Family Communication: no family present at time of exam today  Disposition Plan: stable for transfer to med bed - cont hydrotherapy -  consider VAC tx once all necrotic debris removed - monitor CBGs - possible trach decannulation at discretion of PCCM   Consultants: PCCM - vent management   Procedures: none  Antibiotics: Imipenem 10/2 > 10/11 Vanc 10/2 > 10/11 Fosfomycin 10/6 > 10/10  DVT prophylaxis: lovenox   Objective: Blood pressure 120/76, pulse  83, temperature 100.7 F (38.2 C), temperature source Oral, resp. rate 20, height 5\' 7"  (1.702 m), weight 52.3 kg (115 lb 4.8 oz), SpO2 100 %.  Intake/Output Summary (Last 24 hours) at 07/22/15 1313 Last data filed at 07/22/15 1300  Gross per 24 hour  Intake   1820 ml  Output   4050 ml  Net  -2230 ml   Exam: General: No acute respiratory distress - respirations non-labored on RA w/ PMV in place   Lungs: Clear to auscultation bilaterally - no wheeze  Cardiovascular: Regular rate and rhythm - no murmur or gallup  Abdomen: Nontender, nondistended, soft, bowel sounds positive, no rebound, no ascites, no appreciable mass Extremities: No cyanosis, clubbing, edema bilateral lower extremities - muscle wasting B LE   Data Reviewed: Basic Metabolic Panel:  Recent Labs Lab 07/16/15 0259 07/17/15 0111 07/18/15 0346 07/19/15 0038 07/20/15 0314 07/21/15 0339  NA 144 141 136 127* 132* 131*  K 3.7 3.8 4.7 4.1 3.7 4.7  CL 116* 112* 108 100* 101 103  CO2 21* 19* 20* 20* 24 22  GLUCOSE 106* 89 438* 430* 119* 135*  BUN 27* 21* 19 18 14 14   CREATININE 0.52* 0.38* 0.52* 0.69 0.55* 0.50*  CALCIUM 9.7 9.5 9.3 8.4* 8.8* 8.5*  MG 1.7 1.4* 1.2*  --  1.1* 2.0    CBC:  Recent Labs Lab 07/16/15 0259 07/17/15 0111 07/18/15 0346 07/19/15 0038 07/20/15 0314 07/21/15 0339  WBC 19.4* 24.9* 24.4* 24.6* 21.3* 23.5*  NEUTROABS 13.1* 17.6* 18.9*  --  15.3*  --   HGB 10.7* 11.4* 11.2* 10.9* 10.9* 10.2*  HCT 33.9* 35.6* 34.9* 34.0* 34.3* 32.1*  MCV 87.4 87.0 88.6 88.3 86.4 85.8  PLT 506* 534* 394 412* 415* 374    Liver Function Tests:  Recent Labs Lab 07/16/15 0259 07/17/15 0111 07/18/15 0346 07/20/15 0314  AST 13* 24 17 33  ALT 10* 13* 11* 19  ALKPHOS 137* 128* 122 114  BILITOT 0.4 0.2* 0.8 0.2*  PROT 6.5 6.9 6.3* 6.4*  ALBUMIN 1.6* 1.7* 1.7* 1.6*   CBG:  Recent Labs Lab 07/21/15 1947 07/22/15 0013 07/22/15 0501 07/22/15 0815 07/22/15 1213  GLUCAP 162* 232* 340* 203* 154*      Studies:   Recent x-ray studies have been reviewed in detail by the Attending Physician  Scheduled Meds:  Scheduled Meds: . antiseptic oral rinse  7 mL Mouth Rinse QID  . baclofen  10 mg Oral 2 times per day  . baclofen  20 mg Oral QHS  . chlorhexidine gluconate  15 mL Mouth Rinse BID  . collagenase   Topical Daily  . enoxaparin (LOVENOX) injection  30 mg Subcutaneous Daily  . feeding supplement (VITAL AF 1.2 CAL)  1,000 mL Per Tube Q24H  . fentaNYL  50 mcg Transdermal Q72H  . Influenza vac split quadrivalent PF  0.5 mL Intramuscular Tomorrow-1000  . insulin aspart  0-15 Units Subcutaneous 6 times per day  . insulin glargine  15 Units Subcutaneous Daily  . midodrine  30 mg Oral TID WC  . multivitamin with minerals  1 tablet Oral Daily  . pantoprazole sodium  40 mg Oral Daily  .  pregabalin  100 mg Oral 3 times per day  . pregabalin  200 mg Oral QHS    Time spent on care of this patient: 35 mins   Micheala Morissette T , MD   [facility] Office  [phone] Pager - Text Page per Shea Evans as per below:  On-Call/Text Page:      [email or web address]      password [email or web address]  If 7PM-7AM, please contact night-coverage [email or web address] Password [email or web address] 07/22/2015, 1:13 PM   LOS: 12 days

## 2015-07-22 NOTE — Progress Notes (Signed)
Physical Therapy Wound Treatment Patient Details  Name: Jason [name] MRN: [id number] Date of Birth: 15-Feb-1986  Today's Date: 07/22/2015 Time: 5284-1324 Time Calculation (min): 50 min  Subjective  Subjective: "I'm doing alright" Patient and Family Stated Goals: None stated Date of Onset:  (Present on admission) Prior Treatments: Has received hydrotherapy on previous admission  Pain Score:   "Not too bad right now." no numerical value given.  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 (Active)  Dressing Type ABD;Barrier Film (skin prep);Gauze (Comment);Moist to dry;Tape dressing;Other (Comment) 07/22/2015 11:33 AM  Dressing Changed;Clean;Dry;Intact 07/22/2015 11:33 AM  Dressing Change Frequency Daily 07/22/2015 11:33 AM  State of Healing Early/partial granulation 07/22/2015 11:33 AM  Site / Wound Assessment Pink;Red;Yellow;Granulation tissue 07/22/2015 11:33 AM  % Wound base Red or Granulating 85% 07/22/2015 11:33 AM  % Wound base Yellow 15% 07/22/2015 11:33 AM  Peri-wound Assessment Intact 07/22/2015 11:33 AM  Wound Length (cm) 9.7 cm 07/20/2015 11:27 AM  Wound Width (cm) 7.4 cm 07/20/2015 11:27 AM  Wound Depth (cm) 1.4 cm 07/20/2015 11:27 AM  Undermining (cm) 9:00-5:00 1.8cm; at 12:00 4.0cm 07/20/2015 11:27 AM  Margins Unattached edges (unapproximated) 07/22/2015 11:33 AM  Drainage Amount Copious 07/22/2015 11:33 AM  Drainage Description Serosanguineous;Purulent;Odor 07/22/2015 11:33 AM  Treatment Cleansed;Debridement (Selective);Hydrotherapy (Pulse lavage);Off loading;Packing (Saline gauze);Tape changed;Other (Comment) 07/22/2015 11:33 AM     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 (Active)  Dressing Type ABD;Barrier Film (skin prep);Gauze (Comment);Moist to dry;Tape dressing;Other (Comment) 07/22/2015 11:33 AM  Dressing  Changed;Clean;Dry;Intact 07/22/2015 11:33 AM  Dressing Change Frequency Daily 07/22/2015 11:33 AM  State of Healing Early/partial granulation 07/22/2015 11:33 AM  Site / Wound Assessment Granulation tissue;Pink;Yellow;Red;Brown;Bleeding 07/22/2015 11:33 AM  % Wound base Red or Granulating 75% 07/22/2015 11:33 AM  % Wound base Yellow 10% 07/22/2015 11:33 AM  % Wound base Black 5% 07/22/2015 11:33 AM  % Wound base Other (Comment) 10% 07/22/2015 11:33 AM  Peri-wound Assessment Pink;Maceration 07/22/2015 11:33 AM  Wound Length (cm) 9 cm 07/20/2015 11:27 AM  Wound Width (cm) 11.9 cm 07/20/2015 11:27 AM  Wound Depth (cm) 2.6 cm 07/20/2015 11:27 AM  Undermining (cm) 8:00 - 4:00 2.5cm 07/20/2015 11:27 AM  Margins Epibole (rolled edges) 07/22/2015 11:33 AM  Drainage Amount Copious 07/22/2015 11:33 AM  Drainage Description Serosanguineous;Odor;Purulent;Sanguineous 07/22/2015 11:33 AM  Treatment Cleansed;Debridement (Selective);Hydrotherapy (Pulse lavage);Packing (Saline gauze);Tape changed;Other (Comment) 07/22/2015 11:33 AM     Santyl applied to wound bed prior to applying dressing.  Hydrotherapy Pulsed lavage therapy - wound location: Lt ischial tuberosity and sacrum Pulsed Lavage with Suction (psi): 8 psi Pulsed Lavage with Suction - Normal Saline Used: 1000 mL Pulsed Lavage Tip: Tip with splash shield Selective Debridement Selective Debridement - Location: Lt ischial tuberosity and Sacrum Selective Debridement - Tools Used: Scalpel;Forceps Selective Debridement - Tissue Removed: Yellow slough, Brown/black eschar   Wound Assessment and Plan  Wound Therapy - Assess/Plan/Recommendations Wound Therapy - Clinical Statement: Still withou copious amounts of drainage. Reinforced with additional ABD pad today. Removed more non-viable tissue from perimeter, making good progress. Santyl applied for enzymatic debridement. Continue with current plan of care. Wound Therapy - Functional Problem List: Pt  unable to tolerate prolonged sitting due to pressure ulcers over sacrum and ischial tuberosities Factors Delaying/Impairing Wound Healing: Incontinence;Infection - systemic/local;Polypharmacy;Diabetes Mellitus;Altered sensation;Immobility;Multiple medical problems Hydrotherapy Plan: Debridement;Dressing change;Patient/family education;Pulsatile  lavage with suction Wound Therapy - Frequency: 6X / week Wound Therapy - Follow Up Recommendations: Home health RN Wound Plan: see above  Wound Therapy Goals- Improve the function of patient's integumentary system by progressing the wound(s) through the phases of wound healing (inflammation - proliferation - remodeling) by: Decrease Necrotic Tissue to: 10 Decrease Necrotic Tissue - Progress: Progressing toward goal Increase Granulation Tissue to: 90 Increase Granulation Tissue - Progress: Progressing toward goal Improve Drainage Characteristics: Min Improve Drainage Characteristics - Progress: Not progressing  Goals will be updated until maximal potential achieved or discharge criteria met.  Discharge criteria: when goals achieved, discharge from hospital, MD decision/surgical intervention, no progress towards goals, refusal/missing three consecutive treatments without notification or medical reason.  GP     Candie Mile S 07/22/2015, 11:42 AM  Elayne Snare, [place]

## 2015-07-22 NOTE — Progress Notes (Signed)
Speech Language Pathology Treatment: Dysphagia  Patient Details Name: Jason [name] MRN: [id number] DOB: 1986/01/23 Today's Date: 07/22/2015 Time: 0093-8182 SLP Time Calculation (min) (ACUTE ONLY): 15 min  Assessment / Plan / Recommendation Clinical Impression  Pt eating lunch when SLP arrived. No s/s aspiration today with regular solid and thin liquids. No difficulty verbalized by pt. Reiterated importance of swallow precautions with mildly fragile medical status Recommend continue regular/thin liquids and discharge ST.    HPI Other Pertinent Information: 29 y.o. male with history of spinal cord injury status post paraplegia who was recently admitted for MRSA and microaerophilic strep bacteremia with respiratory failure requiring trach; nocturnal vent. Admitted due to AMS. Found to have UTI, hypothermia, hypotension, toxic metabolic encephalopathy. In August 2016 pt seen for in-line Passy-Muir Speaking valve, trach'd and continued with PMSV. MBS 05/2015 recommended NPO, received PEG. MBS 10/7 with recs for dysphagia 2, honey thick secondary to silent, deep penetration nectars; mild oral phase deficits.  Pt has been tolerating current diet with valve in place for all POs.    Pertinent Vitals Pain Assessment: No/denies pain  SLP Plan  Discharge SLP treatment due to (comment);All goals met    Recommendations Diet recommendations: Regular;Thin liquid Liquids provided via: Cup;Straw Medication Administration: Whole meds with puree Supervision: Patient able to self feed;Intermittent supervision to cue for compensatory strategies Compensations: Slow rate;Small sips/bites Postural Changes and/or Swallow Maneuvers: Seated upright 90 degrees              Oral Care Recommendations: Oral care BID Follow up Recommendations: None Plan: Discharge SLP treatment due to (comment);All goals met    GO     Jason [name] 07/22/2015, 2:26 PM  Jason [name].Ed [employer]  [phone]

## 2015-07-22 NOTE — Progress Notes (Deleted)
Patient/sister Jason [name]) given discharge instructions. Patient informed of appt date. Patient educated on medications. All questions asked and answered. Patient/sister verbalized understanding. Patient discharged home with sister.

## 2015-07-23 LAB — BASIC METABOLIC PANEL
Anion gap: 7 (ref 5–15)
BUN: 13 mg/dL (ref 6–20)
CALCIUM: 8.3 mg/dL — AB (ref 8.9–10.3)
CHLORIDE: 99 mmol/L — AB (ref 101–111)
CO2: 23 mmol/L (ref 22–32)
CREATININE: 0.54 mg/dL — AB (ref 0.61–1.24)
GFR calc Af Amer: >60 mL/min (ref >60)
GLUCOSE: 229 mg/dL — AB (ref 65–99)
POTASSIUM: 4.3 mmol/L (ref 3.5–5.1)
SODIUM: 129 mmol/L — AB (ref 135–145)

## 2015-07-23 LAB — GLUCOSE, CAPILLARY
GLUCOSE-CAPILLARY: 222 mg/dL — AB (ref 65–99)
GLUCOSE-CAPILLARY: 287 mg/dL — AB (ref 65–99)
GLUCOSE-CAPILLARY: 336 mg/dL — AB (ref 65–99)
GLUCOSE-CAPILLARY: 65 mg/dL (ref 65–99)
Glucose-Capillary: 229 mg/dL — ABNORMAL HIGH (ref 65–99)
Glucose-Capillary: 126 mg/dL — ABNORMAL HIGH (ref 65–99)

## 2015-07-23 LAB — CBC
HCT: 30.1 % — ABNORMAL LOW (ref 39.0–52.0)
HEMOGLOBIN: 9.7 g/dL — AB (ref 13.0–17.0)
MCH: 27.8 pg (ref 26.0–34.0)
MCHC: 32.2 g/dL (ref 30.0–36.0)
MCV: 86.2 fL (ref 78.0–100.0)
PLATELETS: 418 10*3/uL — AB (ref 150–400)
RBC: 3.49 MIL/uL — ABNORMAL LOW (ref 4.22–5.81)
RDW: 15.5 % (ref 11.5–15.5)
WBC: 20.2 10*3/uL — ABNORMAL HIGH (ref 4.0–10.5)

## 2015-07-23 MED ORDER — ACETAMINOPHEN 650 MG RE SUPP
650.0000 mg | RECTAL | Status: DC | PRN
  Filled 2015-07-23: qty 1

## 2015-07-23 MED ORDER — VITAL AF 1.2 CAL PO LIQD
1000.0000 mL | ORAL | Status: DC
  Administered 2015-07-24 – 2015-08-03 (×11): 1000 mL
  Filled 2015-07-23 (×13): qty 1000

## 2015-07-23 MED ORDER — ACETAMINOPHEN 325 MG PO TABS
650.0000 mg | ORAL_TABLET | Freq: Four times a day (QID) | ORAL | Status: DC | PRN
  Administered 2015-07-27 – 2015-08-04 (×10): 650 mg via ORAL
  Filled 2015-07-23 (×11): qty 2

## 2015-07-23 MED ORDER — VANCOMYCIN HCL IN DEXTROSE 750-5 MG/150ML-% IV SOLN
750.0000 mg | INTRAVENOUS | Status: DC
  Administered 2015-07-23 – 2015-07-27 (×4): 750 mg via INTRAVENOUS
  Filled 2015-07-23 (×4): qty 150

## 2015-07-23 MED ORDER — INSULIN GLARGINE 100 UNIT/ML ~~LOC~~ SOLN
18.0000 [IU] | Freq: Every day | SUBCUTANEOUS | Status: DC
  Filled 2015-07-23: qty 0.18

## 2015-07-23 NOTE — Progress Notes (Signed)
ANTIBIOTIC CONSULT NOTE - Follow-up  Pharmacy Consult for Vancomycin Indication: osteomyelitis  Allergies  Allergen Reactions  . Cefuroxime Axetil Anaphylaxis  . Ertapenem Other (See Comments)    Rash and confusion-->tolerated Imipenem   . Morphine And Related Other (See Comments)    Changed mental status, confusion, headache, visual hallucination  . Penicillins Anaphylaxis and Other (See Comments)    Tolerated Imipenem; no reaction to 7 day course of amoxicillin in 2015 Has patient had a PCN reaction causing immediate rash, facial/tongue/throat swelling, SOB or lightheadedness with hypotension: Yes Has patient had a PCN reaction causing severe rash involving mucus membranes or skin necrosis: No Has patient had a PCN reaction that required hospitalization Yes Has patient had a PCN reaction occurring within the last 10 years: No If all of the above answers are "NO", then may proceed with Cephalo  . Sulfa Antibiotics Anaphylaxis, Shortness Of Breath and Other (See Comments)  . Tessalon [Benzonatate] Anaphylaxis  . Shellfish Allergy Itching and Other (See Comments)    Took benadryl to alleviate reaction  . Nsaids Other (See Comments)    Risk of bleeding  . Miripirium Rash and Other (See Comments)    Change in mental status    Patient Measurements: ~45 kg  Vital Signs: Temp: 102.2 F (39 C) (10/15 2144) Temp Source: Oral (10/15 2020) BP: 158/86 mmHg (10/15 2020) Pulse Rate: 86 (10/15 2144)  Labs:  Recent Labs  07/21/15 0339 07/23/15 0507  WBC 23.5* 20.2*  HGB 10.2* 9.7*  PLT 374 418*  CREATININE 0.50* 0.54*   Estimated Creatinine Clearance: 99.2 mL/min (by C-G formula based on Cr of 0.54).  Assessment: Jason [name] known to pharmacy from previous dosing of Vancomycin.   Recent course of Vancomycin discontinued 10/12  Will restart Vancomycin at last known dose.  Goal of Therapy:  Vancomycin trough level 15-20 mcg/ml  Plan: - Vancomycin 750 mg iv Q 24 hous - F/u  renal fxn, C&S, clinical status and trough at [facility]  Thank you Anette Guarneri, PharmD [phone] 07/23/2015 10:16 PM

## 2015-07-23 NOTE — Progress Notes (Signed)
PROGRESS NOTE    Jason [name] ZGY:[id number] DOB: 1986-06-01 DOA: 07/10/2015 PCP: [name], MD  HPI/Brief narrative 29 y.o. 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 Patient was recently tapered off his IV antibiotics and was placed on doxycycline and Cipro 3 days prior to this admit for possible UTI.   In the ER patient UA shows features concerning for UTI - patient has chronic suprapubic catheter. Patient was also found to be hypothermic and hypotensive. Patient has had previous history of multiple debridement for chronic sacral decubitus which at this time looks to be healing. Patient also has history of chronic osteomyelitis.    Assessment/Plan:  Toxic metabolic encephalopathy Resolved - patient is presently alert and oriented 4  Sepsis due to MDR Pseudomonas Aeruginosa UTI  Status post third dose of fosfomycin 10/10 per ID recommendations - no further tx planned at this time - isolated fever early 10/14 morning & low grade fever since - suprapubic catheter changed 10/14?   Acute renal failure Due to sepsis - renal function has normalized  Hyponatremia  Due initially to hypovolemia - resolved w/ volume resuscitation but is now recurring - decrease tube feeding and follow. Stable. Some of this is due to hyperglycemia  Hypomagnesemia  Replaced to goal of 2.0  Diabetes mellitus type 1 with hyperglycemia CBGs are peaking at night when tube feeding is begun and then improving during the day when patient is on oral diet - the patient's intake during the day was quite impressive - we will decrease his nightly tube feeding rate and follow CBG trend - he may require a specific separate nighttime sliding scale if  this does not improve his CBG control - CBG's mostly in the low 200's - will increase Lantus slightly to 18 units  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 cont hydrotherapy to wounds as per general surgery recommendations - antibiotic course completed for possible superinfection per ID  Chronic anemia Hgb initially dropped w/ volume expansion - s/p 2U PRBC - no evidence of acute blood loss - hemoglobin appears stable at this time   Chronic respiratory failure with trach and vent pulmonary critical care supposed to be managing vent - it appears the patient took himself off the vent 10/7 and has not been back on since - currently appears stable on 28% trach collar  - As per PCCM ELink note 10/3, it appears that patient is followed in OP trach clinic> will defer further trach decisions to OP follow up in that clinic.  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 IVC filter placed 8/6  History of spinal canal injury with paraplegia  Severe malnutrition in context of chronic illness, Underweight patient now able to eat and cleared for regular diet - transitioned to nocturnal tube feeding regimen - decrease tube feed rate QHS as discussed above  Leukocytosis - decreasing  DVT Prophylaxis: Lovenox Code Status: Full Family Communication: None at bedsdie Disposition Plan: DC home when stable   Consultants:  PCCM - have not seen since E Link 10/3  Procedures:  None  Antibiotics:  Imipenem 10/2 > 10/11  Vanc 10/2 > 10/11  Fosfomycin 10/6 > 10/10   Subjective: Denies complaints.  Objective: Filed Vitals:   07/23/15 0421 07/23/15 0431 07/23/15 0854 07/23/15 1251  BP: 120/81     Pulse: 87 82 90 88  Temp: 99.9 F (37.7 C)     TempSrc: Oral     Resp: 19 18 18 18   Height:      Weight: 52.436 kg (115 lb 9.6 oz)     SpO2: 100% 99% 100% 100%    Intake/Output Summary (Last 24 hours) at 07/23/15 1340 Last data filed at 07/23/15 1100  Gross per 24 hour  Intake 2096.83 ml  Output    4800 ml  Net -2703.17 ml   Filed Weights   07/21/15 0500 07/22/15 0834 07/23/15 0421  Weight: 49 kg (108 lb 0.4 oz) 52.3 kg (115 lb 4.8 oz) 52.436 kg (115 lb 9.6 oz)     Exam:  General exam: Pleasant young male sitting up in bed eating breakfast this morning. Trach>capped Respiratory system: Clear. No increased work of breathing. Cardiovascular system: S1 & S2 heard, RRR. No JVD, murmurs, gallops, clicks or pedal edema. Gastrointestinal system: Abdomen is nondistended, soft and nontender. Normal bowel sounds heard. Central nervous system: Alert and oriented. No focal neurological deficits. Extremities: Quadriparesis- unchanged   Data Reviewed: Basic Metabolic Panel:  Recent Labs Lab 07/17/15 0111 07/18/15 0346 07/19/15 0038 07/20/15 0314 07/21/15 0339 07/23/15 0507  NA 141 136 127* 132* 131* 129*  K 3.8 4.7 4.1 3.7 4.7 4.3  CL 112* 108 100* 101 103 99*  CO2 19* 20* 20* 24 22 23   GLUCOSE 89 438* 430* 119* 135* 229*  BUN 21* 19 18 14 14 13   CREATININE 0.38* 0.52* 0.69 0.55* 0.50* 0.54*  CALCIUM 9.5 9.3 8.4* 8.8* 8.5* 8.3*  MG 1.4* 1.2*  --  1.1* 2.0  --    Liver Function Tests:  Recent Labs Lab 07/17/15 0111 07/18/15 0346 07/20/15 0314  AST 24 17 33  ALT 13* 11* 19  ALKPHOS 128* 122 114  BILITOT 0.2* 0.8 0.2*  PROT 6.9 6.3* 6.4*  ALBUMIN 1.7* 1.7* 1.6*   No results for input(s): LIPASE, AMYLASE in the last 168 hours. No results for input(s): AMMONIA in the last 168 hours. CBC:  Recent Labs Lab 07/17/15 0111 07/18/15 0346 07/19/15 0038 07/20/15 0314 07/21/15 0339 07/23/15 0507  WBC 24.9* 24.4* 24.6* 21.3* 23.5* 20.2*  NEUTROABS 17.6* 18.9*  --  15.3*  --   --   HGB 11.4* 11.2* 10.9* 10.9* 10.2* 9.7*  HCT 35.6* 34.9* 34.0* 34.3* 32.1* 30.1*  MCV 87.0 88.6 88.3 86.4 85.8 86.2  PLT 534* 394 412* 415* 374 418*   Cardiac Enzymes: No results for input(s): CKTOTAL, CKMB, CKMBINDEX, TROPONINI in the last 168 hours. BNP (last 3 results) No results for  input(s): PROBNP in the last 8760 hours. CBG:  Recent Labs Lab 07/22/15 1702 07/22/15 1958 07/22/15 2348 07/23/15 0354 07/23/15 0746  GLUCAP 235* 270* 219* 222* 229*    No results found for this or any previous visit (from the past 240 hour(s)).         Studies: No results found.      Scheduled Meds: . antiseptic oral rinse  7 mL Mouth Rinse QID  . baclofen  10 mg Oral 2 times per day  . baclofen  20 mg Oral QHS  . chlorhexidine gluconate  15 mL Mouth Rinse BID  . collagenase   Topical Daily  . enoxaparin (LOVENOX) injection  30 mg Subcutaneous Daily  . feeding supplement (VITAL AF 1.2 CAL)  1,000 mL Per Tube Q24H  . fentaNYL  50 mcg Transdermal Q72H  .  Influenza vac split quadrivalent PF  0.5 mL Intramuscular Tomorrow-1000  . insulin aspart  0-15 Units Subcutaneous 6 times per day  . insulin glargine  15 Units Subcutaneous Daily  . midodrine  30 mg Oral TID WC  . multivitamin with minerals  1 tablet Oral Daily  . pantoprazole sodium  40 mg Oral Daily  . pregabalin  100 mg Oral 3 times per day  . pregabalin  200 mg Oral QHS   Continuous Infusions: . 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    Vernell Leep, MD, FACP, FHM. [facility] Pager [phone]  If 7PM-7AM, please contact  night-coverage [email or web address] Password [email or web address] 07/23/2015, 1:40 PM    LOS: 13 days

## 2015-07-23 NOTE — Progress Notes (Signed)
Patient appears to have ongoing fevers. Will see in the morning. Will start back iv vancomycin due to hx of mrsa pelvic osteomyelitis/deep tissue infection

## 2015-07-23 NOTE — Progress Notes (Signed)
Physical Therapy Wound Treatment Patient Details  Name: Jason [name] MRN: [id number] Date of Birth: 07-05-1986  Today's Date: 07/23/2015 Time: 10:00-10:42   Subjective  Subjective: No new complaints.  Patient and Family Stated Goals: None stated Date of Onset:  (prior to admission) Prior Treatments: Has received hydrotherapy on previous admission  Pain Score: Pain Score: 2   Wound Assessment     Pressure Ulcer 07/10/15 Stage IV - Full thickness tissue loss with exposed bone, tendon or muscle. (Active)  Dressing Type ABD;Gauze (Comment);Moist to dry;Barrier Film (skin prep);Tape dressing 07/23/2015 11:55 AM  Dressing Changed;Clean;Dry 07/23/2015 11:55 AM  Dressing Change Frequency Daily 07/22/2015  9:00 PM  Site / Wound Assessment Clean;Red;Yellow;Granulation tissue 07/23/2015 11:55 AM  Drainage Amount Minimal 07/23/2015 11:55 AM  Drainage Description Serosanguineous 07/23/2015 11:55 AM  Treatment Packing (Saline gauze);Tape changed 07/22/2015 11:33 AM     Pressure Ulcer 07/11/15 Stage IV - Full thickness tissue loss with exposed bone, tendon or muscle. (Active)  Dressing Type ABD;Gauze (Comment);Moist to dry;Tape dressing 07/23/2015 11:55 AM  Dressing Changed;Dry;Intact 07/23/2015 11:55 AM  Dressing Change Frequency Daily 07/23/2015 11:55 AM  State of Healing Early/partial granulation 07/23/2015 11:55 AM  Site / Wound Assessment Pink;Red;Yellow;Granulation tissue 07/23/2015 11:55 AM  % Wound base Red or Granulating 85% 07/23/2015 11:55 AM  % Wound base Yellow 15% 07/23/2015 11:55 AM  Peri-wound Assessment Intact 07/23/2015 11:55 AM  Margins Unattached edges (unapproximated) 07/23/2015 11:55 AM  Drainage Amount Copious 07/23/2015 11:55 AM  Drainage Description Serosanguineous;Purulent;Odor 07/23/2015 11:55 AM  Treatment Cleansed;Debridement (Selective);Hydrotherapy (Pulse lavage);Packing (Saline gauze);Tape changed 07/23/2015 11:55 AM     Pressure Ulcer 07/11/15 Stage IV -  Full thickness tissue loss with exposed bone, tendon or muscle. (Active)  Dressing Type ABD;Barrier Film (skin prep);Gauze (Comment);Moist to dry;Tape dressing;Other (Comment) 07/23/2015 11:55 AM  Dressing Changed;Clean;Dry 07/23/2015 11:55 AM  Dressing Change Frequency Daily 07/23/2015 11:55 AM  State of Healing Early/partial granulation 07/23/2015 11:55 AM  Site / Wound Assessment Pink;Red;Yellow;Granulation tissue;Brown 07/23/2015 11:55 AM  % Wound base Red or Granulating 75% 07/23/2015 11:55 AM  % Wound base Yellow 15% 07/23/2015 11:55 AM  % Wound base Black 0% 07/23/2015 11:55 AM  % Wound base Other (Comment) 10% 07/23/2015 11:55 AM  Peri-wound Assessment Maceration;Pink 07/23/2015 11:55 AM  Margins Unattached edges (unapproximated) 07/23/2015 11:55 AM  Drainage Amount Copious 07/23/2015 11:55 AM  Drainage Description Serosanguineous;Odor;Purulent;Sanguineous 07/23/2015 11:55 AM  Treatment Cleansed;Debridement (Selective);Hydrotherapy (Pulse lavage);Packing (Saline gauze);Tape changed 07/23/2015 11:55 AM      Hydrotherapy Pulsed lavage therapy - wound location: Lt ischial tuberosity and sacrum Pulsed Lavage with Suction (psi): 8 psi Pulsed Lavage with Suction - Normal Saline Used: 1000 mL Pulsed Lavage Tip: Tip with splash shield Selective Debridement Selective Debridement - Location: Lt ischial tuberosity and Sacrum Selective Debridement - Tools Used: Scalpel;Scissors;Forceps Selective Debridement - Tissue Removed: Yellow slough, Brown eschar   Wound Assessment and Plan  Wound Therapy - Assess/Plan/Recommendations Wound Therapy - Clinical Statement: wound appears to be cleaning up well. Still with copious amount of drainage. Eschar/slough primarily at perimeter/edges of wounds. Progressing towards goals.                             Wound Therapy - Functional Problem List: Pt unable to tolerate prolonged sitting due to pressure ulcers over sacrum and ischial tuberosities Factors  Delaying/Impairing Wound Healing: Incontinence;Infection - systemic/local;Polypharmacy;Diabetes Mellitus;Altered sensation;Immobility;Multiple medical problems Hydrotherapy Plan: Debridement;Dressing change;Patient/family education;Pulsatile lavage with suction Wound Therapy -  Frequency: 6X / week Wound Therapy - Follow Up Recommendations: Home health RN Wound Plan: see above  Wound Therapy Goals- Improve the function of patient's integumentary system by progressing the wound(s) through the phases of wound healing (inflammation - proliferation - remodeling) by: Decrease Necrotic Tissue to: 10 Decrease Necrotic Tissue - Progress: Progressing toward goal Increase Granulation Tissue to: 90 Increase Granulation Tissue - Progress: Progressing toward goal Improve Drainage Characteristics: Min Improve Drainage Characteristics - Progress: Not progressing Goals/treatment plan/discharge plan were made with and agreed upon by patient/family: Yes Time For Goal Achievement: 7 days Wound Therapy - Potential for Goals: Good  Goals will be updated until maximal potential achieved or discharge criteria met.  Discharge criteria: when goals achieved, discharge from hospital, MD decision/surgical intervention, no progress towards goals, refusal/missing three consecutive treatments without notification or medical reason.   Jason [name] 07/23/2015, 12:07 PM  Jason [name], PTA, [phone] 07/23/15, 12:11 PM

## 2015-07-24 DIAGNOSIS — R509 Fever, unspecified: Secondary | ICD-10-CM

## 2015-07-24 DIAGNOSIS — L89624 Pressure ulcer of left heel, stage 4: Secondary | ICD-10-CM

## 2015-07-24 LAB — GLUCOSE, CAPILLARY
GLUCOSE-CAPILLARY: 320 mg/dL — AB (ref 65–99)
GLUCOSE-CAPILLARY: 207 mg/dL — AB (ref 65–99)
GLUCOSE-CAPILLARY: 134 mg/dL — AB (ref 65–99)
Glucose-Capillary: 181 mg/dL — ABNORMAL HIGH (ref 65–99)
Glucose-Capillary: 227 mg/dL — ABNORMAL HIGH (ref 65–99)
Glucose-Capillary: 278 mg/dL — ABNORMAL HIGH (ref 65–99)
Glucose-Capillary: 88 mg/dL (ref 65–99)

## 2015-07-24 MED ORDER — INSULIN GLARGINE 100 UNIT/ML ~~LOC~~ SOLN
15.0000 [IU] | Freq: Every day | SUBCUTANEOUS | Status: DC
  Administered 2015-07-24 – 2015-07-26 (×3): 15 [IU] via SUBCUTANEOUS
  Filled 2015-07-24 (×5): qty 0.15

## 2015-07-24 NOTE — Progress Notes (Signed)
PROGRESS NOTE    Jason [name]:[id number] DOB: [birth year] DOA: 07/10/2015 PCP: [name], MD  HPI/Brief narrative 29 y.o. 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 Patient was recently tapered off his IV antibiotics and was placed on doxycycline and Cipro 3 days prior to this admit for possible UTI.   In the ER patient UA shows features concerning for UTI - patient has chronic suprapubic catheter. Patient was also found to be hypothermic and hypotensive. Patient has had previous history of multiple debridement for chronic sacral decubitus which at this time looks to be healing. Patient also has history of chronic osteomyelitis.    Assessment/Plan:  Toxic metabolic encephalopathy Resolved - patient is presently alert and oriented 4  Sepsis due to MDR Pseudomonas Aeruginosa UTI  Status post third dose of fosfomycin 10/10 per ID recommendations  - suprapubic catheter changed 10/14?  - Spiking fevers again since last night. ID follow-up appreciated: Fevers and leukocytosis concerning for ongoing infection, likely sacral decubitus ulcer/osteomyelitis. Vancomycin was restarted 10/15 and to see if he responds to vancomycin (known to have bone culture with MRSA). May need tissue culture tomorrow to decide on gram-negative coverage (has multiple MDRO-GNR and hence would like to try targeted GNR coverage) however if becomes unstable or persistent fevers then can start imipenem. - We will get wound care to reevaluate and start daily hydrotherapy and reassess left foot and left posterior back wounds as per ID recommendations.  Acute renal failure Due to sepsis - renal function has normalized  Hyponatremia  Due initially  to hypovolemia - resolved w/ volume resuscitation but is now recurring - decrease tube feeding and follow. Stable. Some of this is due to hyperglycemia  Hypomagnesemia  Replaced to goal of 2.0  Diabetes mellitus type 1 with hyperglycemia CBGs are peaking at night when tube feeding is begun and then improving during the day when patient is on oral diet - the patient's intake during the day was quite impressive - we will decrease his nightly tube feeding rate and follow CBG trend - he may require a specific separate nighttime sliding scale if this does not improve his CBG control - CBG's mostly in the low 200's - Patient had a hypoglycemic event last night and hence would drop Lantus back to prior dose of 15 units daily  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 cont hydrotherapy to wounds as per general surgery recommendations - antibiotic reinitiated due to concern for sepsis. ID following. Wound care consultation.  Chronic anemia Hgb initially dropped w/ volume expansion - s/p 2U PRBC - no evidence of acute blood loss - hemoglobin appears stable at this time   Chronic respiratory failure with trach and vent pulmonary critical care supposed to be managing vent - it appears the patient took himself off the vent 10/7 and has not been back on since - currently appears stable on 28% trach collar  - As per PCCM ELink note 10/3, it appears that patient is followed in OP trach clinic> will defer further trach decisions to OP follow up in that clinic. - Discussed with CCM M.D. on call 10/16: Agrees with outpatient follow-up with trach clinic and indicates that he may not be decannulated.   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 IVC filter  placed 8/6  History of spinal canal injury with paraplegia  Severe malnutrition in context of chronic illness, Underweight patient now able to eat and cleared for regular diet - transitioned to nocturnal tube feeding regimen - decrease tube feed rate QHS as discussed  above  Leukocytosis - WBC 20 K on 10/15. Follow CBC in a.m.  DVT Prophylaxis: Lovenox Code Status: Full Family Communication: Discussed with patient's mother at bedside on 10/16  Disposition Plan: DC home when stable   Consultants:  PCCM - have not seen since E Link 10/3  Infectious disease  Procedures:  None  Antibiotics:  Imipenem 10/2 > 10/11  Vanc 10/2 > 10/11  Fosfomycin 10/6 > 10/10  Vancomycin 10/15 >   Subjective: Feels tired. Spiking temperatures since last night.   Objective: Filed Vitals:   07/24/15 0205 07/24/15 0410 07/24/15 0531 07/24/15 0800  BP: 107/70  111/71   Pulse: 91 89 84 90  Temp: 101.7 F (38.7 C)  100.9 F (38.3 C)   TempSrc: Oral  Oral   Resp: 17 16 17 18   Height:      Weight:   55.248 kg (121 lb 12.8 oz)   SpO2: 97% 99% 99% 100%    Intake/Output Summary (Last 24 hours) at 07/24/15 1245 Last data filed at 07/24/15 1224  Gross per 24 hour  Intake    580 ml  Output   6200 ml  Net  -5620 ml   Filed Weights   07/22/15 0834 07/23/15 0421 07/24/15 0531  Weight: 52.3 kg (115 lb 4.8 oz) 52.436 kg (115 lb 9.6 oz) 55.248 kg (121 lb 12.8 oz)     Exam:  General exam: Pleasant young male sitting up in bed comfortably. Mother at bedside . Trach>capped Respiratory system: Clear. No increased work of breathing. Cardiovascular system: S1 & S2 heard, RRR. No JVD, murmurs, gallops, clicks or pedal edema. Gastrointestinal system: Abdomen is nondistended, soft and nontender. Normal bowel sounds heard. Central nervous system: Alert and oriented. No focal neurological deficits. Extremities: Quadriparesis- unchanged Skin: As per infectious disease M.D. note 07/24/15    Data Reviewed: Basic Metabolic Panel:  Recent Labs Lab 07/18/15 0346 07/19/15 0038 07/20/15 0314 07/21/15 0339 07/23/15 0507  NA 136 127* 132* 131* 129*  K 4.7 4.1 3.7 4.7 4.3  CL 108 100* 101 103 99*  CO2 20* 20* 24 22 23   GLUCOSE 438* 430* 119* 135* 229*  BUN  19 18 14 14 13   CREATININE 0.52* 0.69 0.55* 0.50* 0.54*  CALCIUM 9.3 8.4* 8.8* 8.5* 8.3*  MG 1.2*  --  1.1* 2.0  --    Liver Function Tests:  Recent Labs Lab 07/18/15 0346 07/20/15 0314  AST 17 33  ALT 11* 19  ALKPHOS 122 114  BILITOT 0.8 0.2*  PROT 6.3* 6.4*  ALBUMIN 1.7* 1.6*   No results for input(s): LIPASE, AMYLASE in the last 168 hours. No results for input(s): AMMONIA in the last 168 hours. CBC:  Recent Labs Lab 07/18/15 0346 07/19/15 0038 07/20/15 0314 07/21/15 0339 07/23/15 0507  WBC 24.4* 24.6* 21.3* 23.5* 20.2*  NEUTROABS 18.9*  --  15.3*  --   --   HGB 11.2* 10.9* 10.9* 10.2* 9.7*  HCT 34.9* 34.0* 34.3* 32.1* 30.1*  MCV 88.6 88.3 86.4 85.8 86.2  PLT 394 412* 415* 374 418*   Cardiac Enzymes: No results for input(s): CKTOTAL, CKMB, CKMBINDEX, TROPONINI in the last 168 hours. BNP (last 3 results) No results for input(s): PROBNP in the last 8760 hours.  CBG:  Recent Labs Lab 07/23/15 2018 07/23/15 2355 07/24/15 0023 07/24/15 0405 07/24/15 0733  GLUCAP 126* 65 88 227* 320*    No results found for this or any previous visit (from the past 240 hour(s)).         Studies: No results found.      Scheduled Meds: . antiseptic oral rinse  7 mL Mouth Rinse QID  . baclofen  10 mg Oral 2 times per day  . baclofen  20 mg Oral QHS  . chlorhexidine gluconate  15 mL Mouth Rinse BID  . collagenase   Topical Daily  . enoxaparin (LOVENOX) injection  30 mg Subcutaneous Daily  . feeding supplement (VITAL AF 1.2 CAL)  1,000 mL Per Tube Q24H  . fentaNYL  50 mcg Transdermal Q72H  . Influenza vac split quadrivalent PF  0.5 mL Intramuscular Tomorrow-1000  . insulin aspart  0-15 Units Subcutaneous 6 times per day  . insulin glargine  15 Units Subcutaneous Daily  . midodrine  30 mg Oral TID WC  . multivitamin with minerals  1 tablet Oral Daily  . pantoprazole sodium  40 mg Oral Daily  . pregabalin  100 mg Oral 3 times per day  . pregabalin  200 mg Oral  QHS  . vancomycin  750 mg Intravenous Q24H   Continuous Infusions: . 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    Vernell Leep, MD, FACP, FHM. [facility] Pager [phone]  If 7PM-7AM, please contact night-coverage [email or web address] Password [email or web address] 07/24/2015, 12:45 PM    LOS: 14 days

## 2015-07-24 NOTE — Progress Notes (Signed)
[facility]    Date of Admission:  07/10/2015   Total days of antibiotics 10        Day 9 vanco           ID: Thelmer Legler Husted is a 29 y.o. male with male with history of spinal cord injury status post paraplegia who was recently admitted for MRSA and microaerophilic strep bacteremia with respiratory failure requiring trach admitted on 10/2 for AMS. Patient was recently tapered off his IV antibiotics and was placed on doxycycline and Cipro was started 3 days ago for possible UTI.. In the ER patient UA shows features concerning for UTI and patient has chronic suprapubic catheter. Patient is also found to be hypothermic and was also hypotensive which improved with fluid bolus. Patient has had previous history of multiple debridement for chronic sacral decubitus which at this time looks healing. Patient also has history of chronic osteomyelitis. Patient has been admitted for possible developing sepsis. He was initially on vanco and meropenem as well as treated with 3 doses of fosfomycin for catheter related (MDRO) PsA UTI. Off of abtx x 3 days, now having fevers and WBC 20K . 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 antiseptic oral rinse  7 mL Mouth Rinse QID  . baclofen  10 mg Oral 2 times per day  . baclofen  20 mg Oral  QHS  . chlorhexidine gluconate  15 mL Mouth Rinse BID  . collagenase   Topical Daily  . enoxaparin (LOVENOX) injection  30 mg Subcutaneous Daily  . feeding supplement (VITAL AF 1.2 CAL)  1,000 mL Per Tube Q24H  . fentaNYL  50 mcg Transdermal Q72H  . Influenza vac split quadrivalent PF  0.5 mL Intramuscular Tomorrow-1000  . insulin aspart  0-15 Units Subcutaneous 6 times per day  . insulin glargine  15 Units Subcutaneous Daily  . midodrine  30 mg Oral TID WC  . multivitamin with minerals  1 tablet Oral Daily  . pantoprazole sodium  40 mg Oral Daily  . pregabalin  100 mg Oral 3 times per day  . pregabalin  200 mg Oral QHS  . vancomycin  750 mg Intravenous Q24H    Objective: Vital signs in last 24 hours: Temp:  [100.8 F (38.2 C)-102.2 F (39 C)] 100.9 F (38.3 C) (10/16 0531) Pulse Rate:  [84-93] 90 (10/16 0800) Resp:  [16-18] 18 (10/16 0800) BP: (107-158)/(70-87) 111/71 mmHg (10/16 0531) SpO2:  [97 %-100 %] 100 % (10/16 0800) Weight:  [121 lb 12.8 oz (55.248 kg)] 121 lb 12.8 oz (55.248 kg) (10/16 0531) Physical Exam  Constitutional: He is oriented to person, place, and time. He appears well-developed and well-nourished. No distress.  HENT: trach inplace Mouth/Throat: Oropharynx is clear and moist. No oropharyngeal exudate.  Cardiovascular: Normal rate, regular  rhythm and normal heart sounds. Exam reveals no gallop and no friction rub.  No murmur heard.  Pulmonary/Chest: Effort normal and breath sounds normal. No respiratory distress. He has no wheezes.  Abdominal: Soft. Bowel sounds are normal. He exhibits no distension. There is no tenderness. Colostomy on LLQ, suprapubic catheter RLQ Lymphadenopathy:  He has no cervical adenopathy.  Neurological: He is alert and oriented to person, place, and time.  Skin:  1) LEFT LOWER POSTERIOR BACK: Large 8 x 10 shallow ulceration c/w decub ulcer. Appears to have purulent drainage for areas of the ulcer. Bandage is crusted? Suggestive not  changed in the last 24hr. 2) Ischial/sacral wound: 1 large sacral wound that has 10 x 11 cm wide with 2 cm undermining at 10 o clock. Depth not measured. Palpable bone. 2 ischial wounds that have good granulation beds, dressing soaked, still having copious drainage.  3) Left heal has shallow 4 x 2 cm dorsum of foot, good granulation tissue, left plantar aspect of heal 1.5cm x 1 cm with necrotic debris in the wound bed   Psychiatric: He has a normal mood and affect. His behavior is normal.    Lab Results  Recent Labs  07/23/15 0507  WBC 20.2*  HGB 9.7*  HCT 30.1*  NA 129*  K 4.3  CL 99*  CO2 23  BUN 13  CREATININE 0.54*   Lab Results  Component Value Date   ESRSEDRATE 50* 05/10/2015    Microbiology: 10/15 blood cx pending 10/3 blood cx ngtd 10/3 urine cx PsA  Assessment/Plan: Fevers and leukocytosis concerning for ongoing infection, likely sacral decub ulcer/osteomyelitis  - restarted vancomycin - will see if he responds to vancomycin, since he is known to have bone cx with MRSA, may need tissue culture tomorrow to decide on gram negative coverage - he has multiple MDRO-GNR, would like to try to have targeted GNR coverage - if unstable, or persistent fevers, then can restart imipenem  Decub wounds: please have wound care continue doing daily hydrotherapy but also assess recs for LEFT FOOT plantar lesions as well as LEFT POSTERIOR BACK  I have spent 60 min doing wound care/debridement and 100% face to face time with patient  Dr. Tommy Medal to see patient tomorrow  [facility] [place] for Infectious Diseases Cell: [phone] Pager: [phone]  07/24/2015, 12:21 PM

## 2015-07-25 ENCOUNTER — Telehealth: Payer: Self-pay

## 2015-07-25 ENCOUNTER — Encounter: Payer: Medicaid Other | Admitting: Registered Nurse

## 2015-07-25 ENCOUNTER — Inpatient Hospital Stay (HOSPITAL_COMMUNITY): Payer: Medicaid Other

## 2015-07-25 DIAGNOSIS — J9610 Chronic respiratory failure, unspecified whether with hypoxia or hypercapnia: Secondary | ICD-10-CM

## 2015-07-25 DIAGNOSIS — L89154 Pressure ulcer of sacral region, stage 4: Secondary | ICD-10-CM

## 2015-07-25 DIAGNOSIS — Z8619 Personal history of other infectious and parasitic diseases: Secondary | ICD-10-CM

## 2015-07-25 DIAGNOSIS — Z930 Tracheostomy status: Secondary | ICD-10-CM

## 2015-07-25 DIAGNOSIS — Z8614 Personal history of Methicillin resistant Staphylococcus aureus infection: Secondary | ICD-10-CM

## 2015-07-25 DIAGNOSIS — Z1624 Resistance to multiple antibiotics: Secondary | ICD-10-CM

## 2015-07-25 DIAGNOSIS — Z8739 Personal history of other diseases of the musculoskeletal system and connective tissue: Secondary | ICD-10-CM

## 2015-07-25 DIAGNOSIS — A499 Bacterial infection, unspecified: Secondary | ICD-10-CM | POA: Insufficient documentation

## 2015-07-25 DIAGNOSIS — A419 Sepsis, unspecified organism: Secondary | ICD-10-CM

## 2015-07-25 DIAGNOSIS — Z8669 Personal history of other diseases of the nervous system and sense organs: Secondary | ICD-10-CM

## 2015-07-25 LAB — COMPREHENSIVE METABOLIC PANEL
ALBUMIN: 1.8 g/dL — AB (ref 3.5–5.0)
ALT: 53 U/L (ref 17–63)
AST: 60 U/L — AB (ref 15–41)
Alkaline Phosphatase: 135 U/L — ABNORMAL HIGH (ref 38–126)
Anion gap: 7 (ref 5–15)
BUN: 12 mg/dL (ref 6–20)
CHLORIDE: 97 mmol/L — AB (ref 101–111)
CO2: 26 mmol/L (ref 22–32)
CREATININE: 0.53 mg/dL — AB (ref 0.61–1.24)
Calcium: 8.4 mg/dL — ABNORMAL LOW (ref 8.9–10.3)
GFR calc non Af Amer: >60 mL/min (ref >60)
Glucose, Bld: 221 mg/dL — ABNORMAL HIGH (ref 65–99)
Potassium: 4.7 mmol/L (ref 3.5–5.1)
SODIUM: 130 mmol/L — AB (ref 135–145)
Total Bilirubin: 0.4 mg/dL (ref 0.3–1.2)
Total Protein: 6.6 g/dL (ref 6.5–8.1)

## 2015-07-25 LAB — GLUCOSE, CAPILLARY
GLUCOSE-CAPILLARY: 101 mg/dL — AB (ref 65–99)
GLUCOSE-CAPILLARY: 171 mg/dL — AB (ref 65–99)
GLUCOSE-CAPILLARY: 287 mg/dL — AB (ref 65–99)
Glucose-Capillary: 223 mg/dL — ABNORMAL HIGH (ref 65–99)
Glucose-Capillary: 238 mg/dL — ABNORMAL HIGH (ref 65–99)
Glucose-Capillary: 370 mg/dL — ABNORMAL HIGH (ref 65–99)

## 2015-07-25 LAB — CBC
HEMATOCRIT: 30.2 % — AB (ref 39.0–52.0)
HEMOGLOBIN: 9.3 g/dL — AB (ref 13.0–17.0)
MCH: 26.6 pg (ref 26.0–34.0)
MCHC: 30.8 g/dL (ref 30.0–36.0)
MCV: 86.3 fL (ref 78.0–100.0)
PLATELETS: 649 10*3/uL — AB (ref 150–400)
RBC: 3.5 MIL/uL — ABNORMAL LOW (ref 4.22–5.81)
RDW: 15.4 % (ref 11.5–15.5)
WBC: 19.9 10*3/uL — ABNORMAL HIGH (ref 4.0–10.5)

## 2015-07-25 MED ORDER — IOHEXOL 300 MG/ML  SOLN
100.0000 mL | Freq: Once | INTRAMUSCULAR | Status: AC | PRN
  Administered 2015-07-25: 100 mL via INTRAVENOUS

## 2015-07-25 MED ORDER — IOHEXOL 300 MG/ML  SOLN
25.0000 mL | INTRAMUSCULAR | Status: AC
  Administered 2015-07-25 (×2): 25 mL via ORAL

## 2015-07-25 NOTE — Progress Notes (Signed)
Physical Therapy Wound Treatment/DC Note Patient Details  Name: [name] MRN: [id number] Date of Birth: 12-Jan-1986  Today's Date: 07/25/2015 Time: 4650-3546 Time Calculation (min): 40 min  Subjective  Subjective: No new complaints.  Patient and Family Stated Goals: None stated Date of Onset:  (prior to admission) Prior Treatments: Has received hydrotherapy on previous admission  Pain Score: Pain Score: 2   Wound Assessment  Pressure Ulcer 07/11/15 Stage IV - Full thickness tissue loss with exposed bone, tendon or muscle. (Active)  Dressing Type ABD;Gauze (Comment);Moist to dry;Tape dressing 07/25/2015  3:00 PM  Dressing Changed;Dry;Intact;Clean 07/25/2015  3:00 PM  Dressing Change Frequency Daily 07/25/2015  3:00 PM  State of Healing Early/partial granulation 07/25/2015  3:00 PM  Site / Wound Assessment Pink;Red;Yellow;Granulation tissue 07/25/2015  3:00 PM  % Wound base Red or Granulating 95% 07/25/2015  3:00 PM  % Wound base Yellow 5% 07/25/2015  3:00 PM  % Wound base Black 0% 07/25/2015  3:00 PM  % Wound base Other (Comment) 0% 07/25/2015  3:00 PM  Peri-wound Assessment Intact 07/25/2015  3:00 PM  Wound Length (cm) 9.7 cm 07/20/2015 11:27 AM  Wound Width (cm) 7.4 cm 07/20/2015 11:27 AM  Wound Depth (cm) 1.4 cm 07/20/2015 11:27 AM  Undermining (cm) 9:00-5:00 1.8cm; at 12:00 4.0cm 07/20/2015 11:27 AM  Margins Unattached edges (unapproximated) 07/25/2015  3:00 PM  Drainage Amount Moderate 07/25/2015  3:00 PM  Drainage Description Serosanguineous;Other (Comment) 07/25/2015  3:00 PM  Treatment Debridement (Selective);Hydrotherapy (Pulse lavage);Packing (Saline gauze) 07/25/2015  3:00 PM   Santyl applied to wound bed prior to applying dressing.   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 (Active)  Dressing Type ABD;Barrier Film (skin prep);Gauze (Comment);Moist to dry;Tape dressing;Other (Comment) 07/25/2015  3:00 PM   Dressing Changed;Clean;Dry;Intact 07/25/2015  3:00 PM  Dressing Change Frequency Daily 07/25/2015  3:00 PM  State of Healing Early/partial granulation 07/25/2015  3:00 PM  Site / Wound Assessment Pink;Red;Yellow;Granulation tissue 07/25/2015  3:00 PM  % Wound base Red or Granulating 85% 07/25/2015  3:00 PM  % Wound base Yellow 15% 07/25/2015  3:00 PM  % Wound base Black 0% 07/25/2015  3:00 PM  % Wound base Other (Comment) 0% 07/25/2015  3:00 PM  Peri-wound Assessment Intact 07/25/2015  3:00 PM  Wound Length (cm) 9 cm 07/20/2015 11:27 AM  Wound Width (cm) 11.9 cm 07/20/2015 11:27 AM  Wound Depth (cm) 2.6 cm 07/20/2015 11:27 AM  Undermining (cm) 8:00 - 4:00 2.5cm 07/20/2015 11:27 AM  Margins Unattached edges (unapproximated) 07/25/2015  3:00 PM  Drainage Amount Moderate 07/25/2015  3:00 PM  Drainage Description Serosanguineous;Other (Comment) 07/25/2015  3:00 PM  Treatment Debridement (Selective);Hydrotherapy (Pulse lavage);Packing (Saline gauze) 07/25/2015  3:00 PM   Santyl applied to wound bed prior to applying dressing.   Hydrotherapy Pulsed lavage therapy - wound location: Lt ischial tuberosity and sacrum Pulsed Lavage with Suction (psi): 8 psi Pulsed Lavage with Suction - Normal Saline Used: 1000 mL Pulsed Lavage Tip: Tip with splash shield Selective Debridement Selective Debridement - Location: Lt ischial tuberosity and Sacrum Selective Debridement - Tools Used: Scissors;Forceps Selective Debridement - Tissue Removed: Yellow slough   Wound Assessment and Plan  Wound Therapy - Assess/Plan/Recommendations Wound Therapy - Clinical Statement: Wounds look good with little eschar present. No further hydrotherapy needed at this point. Hydro dc'd by [employer] Pt to continue to follow up with wound center and Dr. Migdalia Dk. Wound Therapy - Functional Problem List: Pt unable  to tolerate prolonged sitting due to pressure ulcers over sacrum and ischial tuberosities Factors Delaying/Impairing Wound  Healing: Incontinence;Infection - systemic/local;Polypharmacy;Diabetes Mellitus;Altered sensation;Immobility;Multiple medical problems Hydrotherapy Plan:  (DC'd from hydrotherapy) Wound Therapy - Follow Up Recommendations: [facility] Wound Plan: see above  Wound Therapy Goals- Improve the function of patient's integumentary system by progressing the wound(s) through the phases of wound healing (inflammation - proliferation - remodeling) by: Decrease Necrotic Tissue to: 10 Decrease Necrotic Tissue - Progress: Met Increase Granulation Tissue to: 90 Increase Granulation Tissue - Progress: Met Improve Drainage Characteristics: Min Improve Drainage Characteristics - Progress: Partly met Goals/treatment plan/discharge plan were made with and agreed upon by patient/family: Yes Time For Goal Achievement: 7 days Wound Therapy - Potential for Goals: Good  Goals will be updated until maximal potential achieved or discharge criteria met.  Discharge criteria: when goals achieved, discharge from hospital, MD decision/surgical intervention, no progress towards goals, refusal/missing three consecutive treatments without notification or medical reason.  GP     Tyana Butzer 07/25/2015, 3:16 PM [place]

## 2015-07-25 NOTE — Progress Notes (Signed)
[name]    Subjective: No new complaints   Antibiotics:  Anti-infectives    Start     Dose/Rate Route Frequency Ordered Stop   07/23/15 2300  vancomycin (VANCOCIN) IVPB 750 mg/150 ml premix     750 mg 150 mL/hr over 60 Minutes Intravenous Every 24 hours 07/23/15 2215     07/18/15 0000  vancomycin (VANCOCIN) IVPB 750 mg/150 ml premix     750 mg 150 mL/hr over 60 Minutes Intravenous Every 24 hours 07/17/15 0508 07/19/15 0123   07/17/15 0100  vancomycin (VANCOCIN) 500 mg in sodium chloride 0.9 % 100 mL IVPB  Status:  Discontinued     500 mg 100 mL/hr over 60 Minutes Intravenous Every 24 hours 07/16/15 1016 07/17/15 0507   07/13/15 2000  imipenem-cilastatin (PRIMAXIN) 250 mg in sodium chloride 0.9 % 100 mL IVPB     250 mg 200 mL/hr over 30 Minutes Intravenous 3 times per day 07/13/15 1046 07/19/15 2256   07/11/15 2200  imipenem-cilastatin (PRIMAXIN) 250 mg in sodium chloride 0.9 % 100 mL IVPB  Status:  Discontinued     250 mg 200 mL/hr over 30 Minutes Intravenous Every 12 hours 07/11/15 1020 07/13/15 1046   07/10/15 2230  imipenem-cilastatin (PRIMAXIN) 250 mg in sodium chloride 0.9 % 100 mL IVPB  Status:  Discontinued     250 mg 200 mL/hr over 30 Minutes Intravenous 3 times per day 07/10/15 2226 07/11/15 1020   07/10/15 2230  vancomycin (VANCOCIN) 500 mg in sodium chloride 0.9 % 100 mL IVPB  Status:  Discontinued     500 mg 100 mL/hr over 60 Minutes Intravenous Every 24 hours 07/10/15 2226 07/16/15 1016      Medications: Scheduled Meds: . antiseptic oral rinse  7 mL Mouth Rinse QID  . baclofen  10 mg Oral 2 times per day  . baclofen  20 mg Oral QHS  . chlorhexidine gluconate  15 mL Mouth Rinse BID  . collagenase   Topical Daily  . enoxaparin (LOVENOX) injection  30 mg Subcutaneous Daily  . feeding supplement (VITAL AF 1.2 CAL)  1,000 mL Per Tube Q24H  . fentaNYL  50 mcg Transdermal Q72H  . Influenza vac split quadrivalent  PF  0.5 mL Intramuscular Tomorrow-1000  . insulin aspart  0-15 Units Subcutaneous 6 times per day  . insulin glargine  15 Units Subcutaneous Daily  . midodrine  30 mg Oral TID WC  . multivitamin with minerals  1 tablet Oral Daily  . pantoprazole sodium  40 mg Oral Daily  . pregabalin  100 mg Oral 3 times per day  . pregabalin  200 mg Oral QHS  . vancomycin  750 mg Intravenous Q24H   Continuous Infusions: . sodium chloride 10 mL/hr at 07/22/15 1722   PRN Meds:.acetaminophen **OR** acetaminophen, albuterol, LORazepam, mirtazapine, ondansetron **OR** ondansetron (ZOFRAN) IV, oxyCODONE, RESOURCE THICKENUP CLEAR    Objective: Weight change: -5 lb 13.1 oz (-2.638 kg)  Intake/Output Summary (Last 24 hours) at 07/25/15 1903 Last data filed at 07/25/15 1847  Gross per 24 hour  Intake   1933 ml  Output   7150 ml  Net  -5217 ml   Blood pressure 130/75, pulse 89, temperature 98.9 F (37.2 C), temperature source Oral, resp. rate 20, height 5\' 7"  (1.702 m), weight 115 lb 15.7 oz (52.61 kg), SpO2 99 %. Temp:  [98.9 F (37.2 C)-100.8 F (38.2 C)] 98.9 F (37.2  C) (10/17 1430) Pulse Rate:  [69-94] 89 (10/17 1624) Resp:  [18-20] 20 (10/17 1624) BP: (113-166)/(72-95) 130/75 mmHg (10/17 1430) SpO2:  [99 %-100 %] 99 % (10/17 1624) Weight:  [115 lb 15.7 oz (52.61 kg)] 115 lb 15.7 oz (52.61 kg) (10/17 0443)  Physical Exam: General: Alert and awake, oriented x3, not in any acute distress. HEENT: anicteric sclera, EOMI CVS regular rate, normal r,  Chest: clear to auscultation bilaterally, no wheezing, rales or rhonchi Abdomen: soft  nondistended, Skin: ulcers not examined  Neuro: quadraplegia  CBC: CBC Latest Ref Rng 07/25/2015 07/23/2015 07/21/2015  WBC 4.0 - 10.5 K/uL 19.9(H) 20.2(H) 23.5(H)  Hemoglobin 13.0 - 17.0 g/dL 9.3(L) 9.7(L) 10.2(L)  Hematocrit 39.0 - 52.0 % 30.2(L) 30.1(L) 32.1(L)  Platelets 150 - 400 K/uL 649(H) 418(H) 374       BMET  Recent Labs  07/23/15 0507  07/25/15 0312  NA 129* 130*  K 4.3 4.7  CL 99* 97*  CO2 23 26  GLUCOSE 229* 221*  BUN 13 12  CREATININE 0.54* 0.53*  CALCIUM 8.3* 8.4*     Liver Panel   Recent Labs  07/25/15 0312  PROT 6.6  ALBUMIN 1.8*  AST 60*  ALT 53  ALKPHOS 135*  BILITOT 0.4       Sedimentation Rate No results for input(s): ESRSEDRATE in the last 72 hours. C-Reactive Protein No results for input(s): CRP in the last 72 hours.  Micro Results: Recent Results (from the past 720 hour(s))  Blood culture (routine x 2)     Status: None   Collection Time: 07/10/15  4:25 PM  Result Value Ref Range Status   Specimen Description BLOOD LEFT ARM  Final   Special Requests BOTTLES DRAWN AEROBIC AND ANAEROBIC 5CC  Final   Culture NO GROWTH 5 DAYS  Final   Report Status 07/15/2015 FINAL  Final  MRSA PCR Screening     Status: None   Collection Time: 07/11/15  1:00 AM  Result Value Ref Range Status   MRSA by PCR NEGATIVE NEGATIVE Final    Comment:        The GeneXpert MRSA Assay (FDA approved for NASAL specimens only), is one component of a comprehensive MRSA colonization surveillance program. It is not intended to diagnose MRSA infection nor to guide or monitor treatment for MRSA infections.   Blood culture (routine x 2)     Status: None   Collection Time: 07/11/15  4:19 AM  Result Value Ref Range Status   Specimen Description BLOOD RIGHT FOREARM  Final   Special Requests BOTTLES DRAWN AEROBIC ONLY 2CC  Final   Culture NO GROWTH 5 DAYS  Final   Report Status 07/16/2015 FINAL  Final  Urine culture     Status: None   Collection Time: 07/11/15  5:09 PM  Result Value Ref Range Status   Specimen Description URINE, RANDOM  Final   Special Requests NONE  Final   Culture   Final    >=100,000 COLONIES/mL PSEUDOMONAS AERUGINOSA AMIKACIN MIC=8 SENSITIVE >=100,000 COLONIES/mL YEAST    Report Status 07/21/2015 FINAL  Final   Organism ID, Bacteria PSEUDOMONAS AERUGINOSA  Final      Susceptibility    Pseudomonas aeruginosa - MIC*    CEFTAZIDIME 8 SENSITIVE Sensitive     CIPROFLOXACIN >=4 RESISTANT Resistant     GENTAMICIN >=16 RESISTANT Resistant     IMIPENEM >=16 RESISTANT Resistant     CEFEPIME 16 INTERMEDIATE Intermediate     AZTREONAM Value in next row       >=  16RESISTANT    * >=100,000 COLONIES/mL PSEUDOMONAS AERUGINOSA  Culture, blood (routine x 2)     Status: None (Preliminary result)   Collection Time: 07/23/15 10:35 PM  Result Value Ref Range Status   Specimen Description BLOOD RIGHT ARM  Final   Special Requests IN PEDIATRIC BOTTLE 3CC  Final   Culture NO GROWTH 2 DAYS  Final   Report Status PENDING  Incomplete  Culture, blood (routine x 2)     Status: None (Preliminary result)   Collection Time: 07/23/15 10:45 PM  Result Value Ref Range Status   Specimen Description BLOOD RIGHT ANTECUBITAL  Final   Special Requests BOTTLES DRAWN AEROBIC AND ANAEROBIC 5CC   Final   Culture NO GROWTH 2 DAYS  Final   Report Status PENDING  Incomplete    Studies/Results: Ct Abdomen Pelvis W Contrast  07/25/2015  CLINICAL DATA:  GERD fever of unknown origin, recent MRSA infection, strep bacteremia, respiratory failure, admitted with hallucinations and confusion, question UTI ; history spinal cord injury and paraplegia, type I diabetes mellitus EXAM: CT ABDOMEN AND PELVIS WITH CONTRAST TECHNIQUE: Multidetector CT imaging of the abdomen and pelvis was performed using the standard protocol following bolus administration of intravenous contrast. Sagittal and coronal MPR images reconstructed from axial data set. CONTRAST:  170mL OMNIPAQUE IOHEXOL 300 MG/ML SOLN IV. Dilute oral contrast. COMPARISON:  07/11/2015 FINDINGS: Infiltrate RIGHT lower lobe. Mild atelectasis LEFT lower lobe. Peribronchial thickening in both lower lobes with RIGHT lower lobe bronchiectasis. Contracted gallbladder. Scarring at medial aspect of inferior pole RIGHT kidney unchanged. Liver, spleen, pancreas, kidneys, and adrenal  glands otherwise unremarkable. IVC filter and gastrostomy tube. Normal appendix. Question bowel wall thickening involving the cecum and ascending colon versus artifact from underdistention. LEFT lower quadrant descending colostomy. Stomach and remaining bowel loops unremarkable. Bladder wall thickening, question related to chronic suprapubic catheterization in urinary tract infection versus chronic outlet obstruction. LEFT ureter distended though this could be related to bladder distention. Vascular structures grossly patent. Large sacral decubitus ulcer with prior distal sacrococcygeal destruction or resection. Foci of soft tissue gas within LEFT gluteus maximus question penetration by decubitus ulcer and infection. Diffuse soft tissue edema. Additional foci of nonspecific soft tissue gas RIGHT lateral abdomen. No mass, adenopathy, free air or free fluid. Bones demineralized. IMPRESSION: Questionable wall thickening of the cecum and ascending colon versus artifact from underdistention, unable to exclude colitis. Diffuse bladder wall thickening which may be related to chronic catheterization/infection or outlet obstruction. Diffuse soft tissue edema which can be seen with anasarca and hypoproteinemia. Peribronchial thickening in both lower lobes with RIGHT lower lobe bronchiectasis and RIGHT lower lobe infiltrate. Sacral decubitus ulcer with prior distal sacrococcygeal destruction or resection and foci of gas within the LEFT gluteus maximus question infection. Additional nonspecific foci of soft tissue gas in the RIGHT lateral abdominal wall soft tissue planes. Electronically Signed   By: Lavonia Dana M.D.   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 male with  With spinal cord infarct large decubitus ulcers, recent MRSA and  micro-aerophilic streptococcal bacteremia, respiratory failure requiring tracheostomy complicated by MDR Acinetobacter, MDR pseudomonas who has had yet another admission with septic picture protracted hospilal course with IV vancomycin, imipenem, po fosfomycin.   # 1 Sepsis: not clear what source was. Pt stated his urine had begun "looking dirty" but he only got fosfomycin that was active vs this organism, he now feels much better. WOC stated that his wounds were looking much improved at the bedside. His CT without contrast was unrevealing  I ordered CT WITH contrast and this showed an area   Sacral decubitus ulcer with prior distal sacrococcygeal destruction or resection and foci of gas within the LEFT gluteus maximus question infection.  I would recommend discussing with Plastic Surgery. I cannot find record of sacrococcygeal resection  IF this is NEW and this is the source of his sepsis then we would better able to direct his IV antibiotics with deep cultures from area of bone involved.  Would continue IV vancomycin  for now        LOS: 15 days   [name] 07/25/2015, 7:03 PM

## 2015-07-25 NOTE — Telephone Encounter (Signed)
Refill request for [facility]. [place].

## 2015-07-25 NOTE — Consult Note (Addendum)
WOC wound re-consult note Requested to reassess wounds.  Physical therapy at bedside has been performing hydrotherapy to sacrum and left ischial wounds during this hospital stay and feels it has provided the maximum amt of benefit this treatment can offer and it is appropriate to discontinue at this time.  Assessed wounds with them at the bedside and discussed plan of care.  Refer to PT notes for percentages and wound measurements.  CCS was previously following but has signed off at this time. Reason for Consult: Re-consult requested for multiple wounds. Pt is very emeciated with multiple systemic factors which can impair healing. LLQ with colostomy pouch in place with good seal, supplies at bedside for staff nurse use. Wound type: Left plantar heel with stage 2 pressure injury; 1X.8X.1cm, pink and moist with small amt yellow drainage, no odor. Left plantar foot near heel with unstageable pressure injury; .8X.8cm, 80% tightly adhered slough, 20% red, small amt tan drainage, no odor Left anterior foot near small toe stage 2 pressure injury 1X1X.1cm, red and moist Left outer foot stage 2 pressure injury 2X2X.1cm, red and moist Right ischium with stage 4 pressure injury; beefy red, mod amt tan drainage, no odor Left ischium with stage 4 pressure injury; mod amt tan drainage, no odor. Left hip with stage 4 pressure injury; mod amt tan drainage, no odor. Left posterior back with chronic full thickness wound; 12X12X.1cm, 90% red, 10% darker colored skin, large amt thick tan drainage, no odor. Pressure Ulcer POA: Yes Dressing procedure/placement/frequency: Continue present plan of care with Aquacel to posterior back to absorb drainage and provide antimicrobial benefits. Continue Santyl for chemical debridement to left ischium and left hip and left plantar foot to assist with removal of nonviable tissue. Moist gauze packing to right ischium wound. Float heels to reduce pressure. Pt is on a Sport low airloss  bed to reduce pressure. Mother is not at bedside to discuss plan of care. Pt can resume follow-up with Dr Migdalia Dk of the plastics team at the outpatient wound clinic after discharge for his chronic wounds and to determine further plan of care. Wounds are stable and clean at this time and could be managed by home health upon discharge, as they were previously following him. Please re-consult if further assistance is needed. Thank-you,  Julien Girt MSN, [place], No Name, [place], [place]

## 2015-07-25 NOTE — Progress Notes (Signed)
Inpatient Diabetes Program Recommendations  AACE/ADA: New Consensus Statement on Inpatient Glycemic Control (2015)  Target Ranges:  Prepandial:   less than 140 mg/dL      Peak postprandial:   less than 180 mg/dL (1-2 hours)      Critically ill patients:  140 - 180 mg/dL  Results for IZZY, COURVILLE (MRN [id number]) as of 07/25/2015 09:15  Ref. Range 07/24/2015 04:05 07/24/2015 07:33 07/24/2015 12:52 07/24/2015 17:00 07/24/2015 19:56 07/24/2015 22:56 07/25/2015 04:41 07/25/2015 07:42  Glucose-Capillary Latest Ref Range: 65-99 mg/dL 227 (H) 320 (H) 207 (H) 134 (H) 181 (H) 278 (H) 223 (H) 238 (H)   Review of Glycemic Control  Current orders for Inpatient glycemic control: Lantus 15 units daily, Novolog 0-15 units Q4H  Inpatient Diabetes Program Recommendations: Insulin - Basal: Please consider increasing Lantus to 16 units daily. Insulin - Meal Coverage: According to the progress note on 07/24/15 by Dr. Algis Liming, patient is receiving nocturnal  tube feeding. Noted glucose more elevated late evening and early morming. Glucose is 238 mg/dl this morning. May want to consider ordering Novolog 3 units BID (at 12am and 4am) for tube feeding coverage.  Diet: If appropriate, please consider changing diet from Regular to Carb Modified.  Thanks, Barnie Alderman, RN, MSN, CCRN, CDE Diabetes Coordinator Inpatient Diabetes Program [phone] (Team Pager from [place] to [place]) [phone] ([facility] office) [phone] [facility] office) [phone] [facility] office)

## 2015-07-25 NOTE — Progress Notes (Signed)
PROGRESS NOTE    Jason [name] VHQ:[id number] DOB: 10-30-1985 DOA: 07/10/2015 PCP: [name], MD  HPI/Brief narrative 29 y.o. 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 Patient was recently tapered off his IV antibiotics and was placed on doxycycline and Cipro 3 days prior to this admit for possible UTI.   In the ER patient UA shows features concerning for UTI - patient has chronic suprapubic catheter. Patient was also found to be hypothermic and hypotensive. Patient has had previous history of multiple debridement for chronic sacral decubitus which at this time looks to be healing. Patient also has history of chronic osteomyelitis.    Assessment/Plan:  Toxic metabolic encephalopathy Resolved - patient is presently alert and oriented  Sepsis due to MDR Pseudomonas Aeruginosa UTI  Status post third dose of fosfomycin 10/10 per ID recommendations  - suprapubic catheter changed 10/14?  - ID follow-up appreciated: CT Scan ordered  Acute renal failure Due to sepsis - renal function has normalized  Hyponatremia  Due initially to hypovolemia - resolved w/ volume resuscitation but is now recurring - decrease tube feeding and follow. Stable. Some of this is due to hyperglycemia  Hypomagnesemia  Replaced to goal of 2.0  Diabetes mellitus type 1 with hyperglycemia CBGs are peaking at night when tube feeding is begun and then improving during the day when patient is on oral diet - the patient's intake during the day was quite impressive - we will decrease his nightly tube feeding rate and follow CBG trend - he may require a specific separate nighttime sliding scale if this does not improve his CBG control - CBG's mostly  in the low 200's - Patient had a hypoglycemic event last night and hence would drop Lantus back to prior dose of 15 units daily  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 cont hydrotherapy to wounds as per general surgery recommendations - antibiotic reinitiated due to concern for sepsis. ID following. Wound care consultation.  Chronic anemia Hgb initially dropped w/ volume expansion - s/p 2U PRBC - no evidence of acute blood loss - hemoglobin appears stable at this time   Chronic respiratory failure with trach and vent pulmonary critical care supposed to be managing vent - it appears the patient took himself off the vent 10/7 and has not been back on since - currently appears stable on 28% trach collar  - As per PCCM ELink note 10/3, it appears that patient is followed in OP trach clinic> will defer further trach decisions to OP follow up in that clinic. - Discussed with CCM M.D. on call 10/16: Agrees with outpatient follow-up with trach clinic and indicates that he may not be decannulated.   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 IVC filter placed 8/6  History of spinal canal injury with paraplegia  Severe malnutrition in context of chronic illness, Underweight patient now able to eat and cleared for regular diet - transitioned to nocturnal tube feeding regimen - decrease tube feed rate QHS as discussed above  Leukocytosis - WBC 20 K on 10/15. Follow CBC in a.m.  DVT Prophylaxis: Lovenox Code Status: Full Family Communication: no family at bedside Disposition Plan: DC home when stable   Consultants:  PCCM - have not seen since E Link 10/3  Infectious disease  Procedures:  None  Antibiotics:  Imipenem 10/2 > 10/11  Vanc 10/2 > 10/11  Fosfomycin 10/6 > 10/10  Vancomycin 10/15 >   Subjective: He spoke to ID and hopes to go home on PO abx  Objective: Filed Vitals:   07/25/15 0319 07/25/15 0443 07/25/15 0844 07/25/15 1214  BP:  113/72    Pulse:  69 84 94  Temp:  99 F  (37.2 C)    TempSrc:  Oral    Resp:  18 18 18   Height:      Weight:  52.61 kg (115 lb 15.7 oz)    SpO2: 100% 100% 100% 99%    Intake/Output Summary (Last 24 hours) at 07/25/15 1418 Last data filed at 07/25/15 1244  Gross per 24 hour  Intake   1693 ml  Output   5250 ml  Net  -3557 ml   Filed Weights   07/23/15 0421 07/24/15 0531 07/25/15 0443  Weight: 52.436 kg (115 lb 9.6 oz) 55.248 kg (121 lb 12.8 oz) 52.61 kg (115 lb 15.7 oz)     Exam:  General exam: Pleasant young male sitting up in bed comfortably. Mother at bedside . Trach>capped Respiratory system: Clear. No increased work of breathing. Cardiovascular system: S1 & S2 heard, RRR. No JVD, murmurs, gallops, clicks or pedal edema. Gastrointestinal system: Abdomen is nondistended, soft and nontender. Normal bowel sounds heard. Central nervous system: Alert and oriented. No focal neurological deficits. Extremities: Quadriparesis- unchanged    Data Reviewed: Basic Metabolic Panel:  Recent Labs Lab 07/19/15 0038 07/20/15 0314 07/21/15 0339 07/23/15 0507 07/25/15 0312  NA 127* 132* 131* 129* 130*  K 4.1 3.7 4.7 4.3 4.7  CL 100* 101 103 99* 97*  CO2 20* 24 22 23 26   GLUCOSE 430* 119* 135* 229* 221*  BUN 18 14 14 13 12   CREATININE 0.69 0.55* 0.50* 0.54* 0.53*  CALCIUM 8.4* 8.8* 8.5* 8.3* 8.4*  MG  --  1.1* 2.0  --   --    Liver Function Tests:  Recent Labs Lab 07/20/15 0314 07/25/15 0312  AST 33 60*  ALT 19 53  ALKPHOS 114 135*  BILITOT 0.2* 0.4  PROT 6.4* 6.6  ALBUMIN 1.6* 1.8*   No results for input(s): LIPASE, AMYLASE in the last 168 hours. No results for input(s): AMMONIA in the last 168 hours. CBC:  Recent Labs Lab 07/19/15 0038 07/20/15 0314 07/21/15 0339 07/23/15 0507 07/25/15 0312  WBC 24.6* 21.3* 23.5* 20.2* 19.9*  NEUTROABS  --  15.3*  --   --   --   HGB 10.9* 10.9* 10.2* 9.7* 9.3*  HCT 34.0* 34.3* 32.1* 30.1* 30.2*  MCV 88.3 86.4 85.8 86.2 86.3  PLT 412* 415* 374 418* 649*    Cardiac Enzymes: No results for input(s): CKTOTAL, CKMB, CKMBINDEX, TROPONINI in the last 168 hours. BNP (last 3 results) No results for input(s): PROBNP in the last 8760 hours. CBG:  Recent Labs Lab 07/24/15 1956 07/24/15 2256 07/25/15 0441 07/25/15 0742 07/25/15 1151  GLUCAP 181* 278* 223* 238* 370*    Recent Results (from the past 240 hour(s))  Culture, blood (routine x 2)     Status: None (Preliminary result)   Collection Time: 07/23/15 10:35 PM  Result Value Ref Range Status   Specimen Description BLOOD RIGHT ARM  Final   Special Requests IN PEDIATRIC BOTTLE 3CC  Final   Culture NO GROWTH 2 DAYS  Final   Report Status PENDING  Incomplete  Culture, blood (routine x 2)  Status: None (Preliminary result)   Collection Time: 07/23/15 10:45 PM  Result Value Ref Range Status   Specimen Description BLOOD RIGHT ANTECUBITAL  Final   Special Requests BOTTLES DRAWN AEROBIC AND ANAEROBIC 5CC   Final   Culture NO GROWTH 2 DAYS  Final   Report Status PENDING  Incomplete           Studies: No results found.      Scheduled Meds: . antiseptic oral rinse  7 mL Mouth Rinse QID  . baclofen  10 mg Oral 2 times per day  . baclofen  20 mg Oral QHS  . chlorhexidine gluconate  15 mL Mouth Rinse BID  . collagenase   Topical Daily  . enoxaparin (LOVENOX) injection  30 mg Subcutaneous Daily  . feeding supplement (VITAL AF 1.2 CAL)  1,000 mL Per Tube Q24H  . fentaNYL  50 mcg Transdermal Q72H  . Influenza vac split quadrivalent PF  0.5 mL Intramuscular Tomorrow-1000  . insulin aspart  0-15 Units Subcutaneous 6 times per day  . insulin glargine  15 Units Subcutaneous Daily  . midodrine  30 mg Oral TID WC  . multivitamin with minerals  1 tablet Oral Daily  . pantoprazole sodium  40 mg Oral Daily  . pregabalin  100 mg Oral 3 times per day  . pregabalin  200 mg Oral QHS  . vancomycin  750 mg Intravenous Q24H   Continuous Infusions: . sodium chloride 10 mL/hr at 07/22/15  1722    Principal Problem:   Acute encephalopathy Active Problems:   Palliative care encounter   Chronic anemia   Uncontrolled type 1 diabetes mellitus with hyperglycemia ([name])   Altered mental status   Tracheostomy dependent ([name])   H/O spinal cord injury   Chronic paraplegia ([name])   Acute on chronic respiratory failure ([name])   Hypomagnesemia   Decubitus ulcer of right perineal ischial region, stage 4 ([name])   Decubitus ulcer of left perineal ischial region, stage 4 ([name])   Decubitus ulcer of sacral region, stage 4 ([name])   Complicated urinary tract infection   Infection with Pseudomonas aeruginosa resistant to multiple drugs    Time spent: 25 minutes.    VANN, JESSICA, DO. [facility] Pager [phone]  If 7PM-7AM, please contact night-coverage [email or web address] Password [email or web address] 07/25/2015, 2:18 PM    LOS: 15 days

## 2015-07-26 ENCOUNTER — Other Ambulatory Visit: Payer: Self-pay | Admitting: Family Medicine

## 2015-07-26 LAB — GLUCOSE, CAPILLARY
GLUCOSE-CAPILLARY: 179 mg/dL — AB (ref 65–99)
GLUCOSE-CAPILLARY: 276 mg/dL — AB (ref 65–99)
GLUCOSE-CAPILLARY: 300 mg/dL — AB (ref 65–99)
GLUCOSE-CAPILLARY: 66 mg/dL (ref 65–99)
Glucose-Capillary: 210 mg/dL — ABNORMAL HIGH (ref 65–99)
Glucose-Capillary: 93 mg/dL (ref 65–99)
Glucose-Capillary: 106 mg/dL — ABNORMAL HIGH (ref 65–99)

## 2015-07-26 LAB — VANCOMYCIN, TROUGH: Vancomycin Tr: 14 ug/mL (ref 10.0–20.0)

## 2015-07-26 MED ORDER — INSULIN GLARGINE 100 UNIT/ML ~~LOC~~ SOLN
16.0000 [IU] | Freq: Every day | SUBCUTANEOUS | Status: DC
  Administered 2015-07-27 – 2015-07-28 (×2): 16 [IU] via SUBCUTANEOUS
  Filled 2015-07-26 (×2): qty 0.16

## 2015-07-26 NOTE — Progress Notes (Signed)
PROGRESS NOTE    Jason [name] [id number] DOB: [birth year] DOA: 07/10/2015 PCP: [name], MD  HPI/Brief narrative 29 y.o. 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 Patient was recently tapered off his IV antibiotics and was placed on doxycycline and Cipro 3 days prior to this admit for possible UTI.   In the ER patient UA shows features concerning for UTI - patient has chronic suprapubic catheter. Patient was also found to be hypothermic and hypotensive. Patient has had previous history of multiple debridement for chronic sacral decubitus which at this time looks to be healing. Patient also has history of chronic osteomyelitis.    Assessment/Plan:  Toxic metabolic encephalopathy Resolved - patient is presently alert and oriented  Sepsis due to MDR Pseudomonas Aeruginosa UTI ?  Vs decubitus Status post third dose of fosfomycin 10/10 per ID recommendations  - suprapubic catheter changed 10/14?  - ID follow-up appreciated: CT Scan showed: Sacral decubitus ulcer with prior distal sacrococcygeal destruction or resection and foci of gas within the LEFT gluteus maximus question infection. -plastic surgery consulted- Dr. Migdalia Dk will see  Acute renal failure Due to sepsis - renal function has normalized  Hyponatremia  Due initially to hypovolemia - resolved w/ volume resuscitation but is now recurring - decrease tube feeding and follow. Stable. Some of this is due to hyperglycemia  Hypomagnesemia  Replaced to goal of 2.0  Diabetes mellitus type 1 with hyperglycemia CBGs are peaking at night when tube feeding is begun and then improving during the day when patient is on oral diet - -increase lantus to 16    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 cont hydrotherapy to wounds as per general surgery recommendations - antibiotic reinitiated due to concern for sepsis. ID following. Wound care consultation. -plastic consullt  Chronic anemia Hgb initially dropped w/ volume expansion - s/p 2U PRBC - no evidence of acute blood loss - hemoglobin appears stable at this time   Chronic respiratory failure with trach and vent pulmonary critical care supposed to be managing vent - it appears the patient took himself off the vent 10/7 and has not been back on since - currently appears stable on 28% trach collar  - As per PCCM ELink note 10/3, it appears that patient is followed in OP trach clinic> will defer further trach decisions to OP follow up in that clinic. - Discussed with CCM M.D. on call 10/16: Agrees with outpatient follow-up with trach clinic and indicates that he may not be decannulated.   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 IVC filter placed 8/6  History of spinal canal injury with paraplegia  Severe malnutrition in context of chronic illness, Underweight patient now able to eat and cleared for regular diet - transitioned to nocturnal tube feeding regimen - decrease tube feed rate QHS as discussed above  Leukocytosis - trend  DVT Prophylaxis: Lovenox Code Status: Full Family Communication: no family at bedside Disposition Plan: DC home when stable   Consultants:  PCCM - have not seen since E Link 10/3  Infectious disease  Procedures:  None  Antibiotics:  Imipenem 10/2 > 10/11  Vanc 10/2 > 10/11  Fosfomycin 10/6 > 10/10  Vancomycin 10/15 >   Subjective: He spoke to ID and hopes to go home on  PO abx  Objective: Filed Vitals:   07/26/15 0600 07/26/15 0700 07/26/15 0911 07/26/15 1209  BP:      Pulse:   94 95  Temp: 99.5 F (37.5 C)     TempSrc: Oral     Resp:   16 18  Height:      Weight:  53.41 kg (117 lb 12 oz)    SpO2:   100% 100%    Intake/Output Summary  (Last 24 hours) at 07/26/15 1237 Last data filed at 07/26/15 1028  Gross per 24 hour  Intake   1947 ml  Output   5550 ml  Net  -3603 ml   Filed Weights   07/24/15 0531 07/25/15 0443 07/26/15 0700  Weight: 55.248 kg (121 lb 12.8 oz) 52.61 kg (115 lb 15.7 oz) 53.41 kg (117 lb 12 oz)     Exam:  General exam: Pleasant young male sitting up in bed comfortably. Mother at bedside . Trach>capped Respiratory system: Clear. No increased work of breathing. Cardiovascular system: S1 & S2 heard, RRR. No JVD, murmurs, gallops, clicks or pedal edema. Gastrointestinal system: Abdomen is nondistended, soft and nontender. Normal bowel sounds heard. Central nervous system: Alert and oriented. No focal neurological deficits. Extremities: Quadriparesis- unchanged    Data Reviewed: Basic Metabolic Panel:  Recent Labs Lab 07/20/15 0314 07/21/15 0339 07/23/15 0507 07/25/15 0312  NA 132* 131* 129* 130*  K 3.7 4.7 4.3 4.7  CL 101 103 99* 97*  CO2 24 22 23 26   GLUCOSE 119* 135* 229* 221*  BUN 14 14 13 12   CREATININE 0.55* 0.50* 0.54* 0.53*  CALCIUM 8.8* 8.5* 8.3* 8.4*  MG 1.1* 2.0  --   --    Liver Function Tests:  Recent Labs Lab 07/20/15 0314 07/25/15 0312  AST 33 60*  ALT 19 53  ALKPHOS 114 135*  BILITOT 0.2* 0.4  PROT 6.4* 6.6  ALBUMIN 1.6* 1.8*   No results for input(s): LIPASE, AMYLASE in the last 168 hours. No results for input(s): AMMONIA in the last 168 hours. CBC:  Recent Labs Lab 07/20/15 0314 07/21/15 0339 07/23/15 0507 07/25/15 0312  WBC 21.3* 23.5* 20.2* 19.9*  NEUTROABS 15.3*  --   --   --   HGB 10.9* 10.2* 9.7* 9.3*  HCT 34.3* 32.1* 30.1* 30.2*  MCV 86.4 85.8 86.2 86.3  PLT 415* 374 418* 649*   Cardiac Enzymes: No results for input(s): CKTOTAL, CKMB, CKMBINDEX, TROPONINI in the last 168 hours. BNP (last 3 results) No results for input(s): PROBNP in the last 8760 hours. CBG:  Recent Labs Lab 07/25/15 2233 07/26/15 0002 07/26/15 0433  07/26/15 0745 07/26/15 1205  GLUCAP 287* 300* 276* 179* 210*    Recent Results (from the past 240 hour(s))  Culture, blood (routine x 2)     Status: None (Preliminary result)   Collection Time: 07/23/15 10:35 PM  Result Value Ref Range Status   Specimen Description BLOOD RIGHT ARM  Final   Special Requests IN PEDIATRIC BOTTLE 3CC  Final   Culture NO GROWTH 2 DAYS  Final   Report Status PENDING  Incomplete  Culture, blood (routine x 2)     Status: None (Preliminary result)   Collection Time: 07/23/15 10:45 PM  Result Value Ref Range Status   Specimen Description BLOOD RIGHT ANTECUBITAL  Final   Special Requests BOTTLES DRAWN AEROBIC AND ANAEROBIC 5CC   Final   Culture NO GROWTH 2 DAYS  Final   Report Status PENDING  Incomplete  Studies: Ct Abdomen Pelvis W Contrast  07/25/2015  CLINICAL DATA:  GERD fever of unknown origin, recent MRSA infection, strep bacteremia, respiratory failure, admitted with hallucinations and confusion, question UTI ; history spinal cord injury and paraplegia, type I diabetes mellitus EXAM: CT ABDOMEN AND PELVIS WITH CONTRAST TECHNIQUE: Multidetector CT imaging of the abdomen and pelvis was performed using the standard protocol following bolus administration of intravenous contrast. Sagittal and coronal MPR images reconstructed from axial data set. CONTRAST:  160mL OMNIPAQUE IOHEXOL 300 MG/ML SOLN IV. Dilute oral contrast. COMPARISON:  07/11/2015 FINDINGS: Infiltrate RIGHT lower lobe. Mild atelectasis LEFT lower lobe. Peribronchial thickening in both lower lobes with RIGHT lower lobe bronchiectasis. Contracted gallbladder. Scarring at medial aspect of inferior pole RIGHT kidney unchanged. Liver, spleen, pancreas, kidneys, and adrenal glands otherwise unremarkable. IVC filter and gastrostomy tube. Normal appendix. Question bowel wall thickening involving the cecum and ascending colon versus artifact from underdistention. LEFT lower quadrant descending  colostomy. Stomach and remaining bowel loops unremarkable. Bladder wall thickening, question related to chronic suprapubic catheterization in urinary tract infection versus chronic outlet obstruction. LEFT ureter distended though this could be related to bladder distention. Vascular structures grossly patent. Large sacral decubitus ulcer with prior distal sacrococcygeal destruction or resection. Foci of soft tissue gas within LEFT gluteus maximus question penetration by decubitus ulcer and infection. Diffuse soft tissue edema. Additional foci of nonspecific soft tissue gas RIGHT lateral abdomen. No mass, adenopathy, free air or free fluid. Bones demineralized. IMPRESSION: Questionable wall thickening of the cecum and ascending colon versus artifact from underdistention, unable to exclude colitis. Diffuse bladder wall thickening which may be related to chronic catheterization/infection or outlet obstruction. Diffuse soft tissue edema which can be seen with anasarca and hypoproteinemia. Peribronchial thickening in both lower lobes with RIGHT lower lobe bronchiectasis and RIGHT lower lobe infiltrate. Sacral decubitus ulcer with prior distal sacrococcygeal destruction or resection and foci of gas within the LEFT gluteus maximus question infection. Additional nonspecific foci of soft tissue gas in the RIGHT lateral abdominal wall soft tissue planes. Electronically Signed   By: Lavonia Dana M.D.   On: 07/25/2015 17:46        Scheduled Meds: . baclofen  10 mg Oral 2 times per day  . baclofen  20 mg Oral QHS  . chlorhexidine gluconate  15 mL Mouth Rinse BID  . collagenase   Topical Daily  . enoxaparin (LOVENOX) injection  30 mg Subcutaneous Daily  . feeding supplement (VITAL AF 1.2 CAL)  1,000 mL Per Tube Q24H  . fentaNYL  50 mcg Transdermal Q72H  . Influenza vac split quadrivalent PF  0.5 mL Intramuscular Tomorrow-1000  . insulin aspart  0-15 Units Subcutaneous 6 times per day  . insulin glargine  15 Units  Subcutaneous Daily  . midodrine  30 mg Oral TID WC  . multivitamin with minerals  1 tablet Oral Daily  . pantoprazole sodium  40 mg Oral Daily  . pregabalin  100 mg Oral 3 times per day  . pregabalin  200 mg Oral QHS  . vancomycin  750 mg Intravenous Q24H   Continuous Infusions: . sodium chloride 10 mL/hr at 07/22/15 1722    Principal Problem:   Acute encephalopathy Active Problems:   Palliative care encounter   Chronic anemia   Uncontrolled type 1 diabetes mellitus with hyperglycemia ([name])   Altered mental status   Tracheostomy dependent ([name])   H/O spinal cord injury   Chronic paraplegia ([name])   Acute on chronic respiratory failure ([place])  Hypomagnesemia   Decubitus ulcer of right perineal ischial region, stage 4 ([name])   Decubitus ulcer of left perineal ischial region, stage 4 ([name])   Decubitus ulcer of sacral region, stage 4 ([name])   Complicated urinary tract infection   Infection with Pseudomonas aeruginosa resistant to multiple drugs   MDR Acinetobacter baumannii infection    Time spent: 25 minutes.    Osamu Olguin, DO. [facility] Pager [phone]  If 7PM-7AM, please contact night-coverage [email or web address] Password [email or web address] 07/26/2015, 12:37 PM    LOS: 16 days

## 2015-07-26 NOTE — Progress Notes (Signed)
Utilization review completed.  

## 2015-07-26 NOTE — Progress Notes (Signed)
ANTIBIOTIC CONSULT NOTE - Follow-up  Pharmacy Consult for Vancomycin Indication: osteomyelitis  Allergies  Allergen Reactions  . Cefuroxime Axetil Anaphylaxis  . Ertapenem Other (See Comments)    Rash and confusion-->tolerated Imipenem   . Morphine And Related Other (See Comments)    Changed mental status, confusion, headache, visual hallucination  . Penicillins Anaphylaxis and Other (See Comments)    Tolerated Imipenem; no reaction to 7 day course of amoxicillin in 2015 Has patient had a PCN reaction causing immediate rash, facial/tongue/throat swelling, SOB or lightheadedness with hypotension: Yes Has patient had a PCN reaction causing severe rash involving mucus membranes or skin necrosis: No Has patient had a PCN reaction that required hospitalization Yes Has patient had a PCN reaction occurring within the last 10 years: No If all of the above answers are "NO", then may proceed with Cephalo  . Sulfa Antibiotics Anaphylaxis, Shortness Of Breath and Other (See Comments)  . Tessalon [Benzonatate] Anaphylaxis  . Shellfish Allergy Itching and Other (See Comments)    Took benadryl to alleviate reaction  . Nsaids Other (See Comments)    Risk of bleeding  . Miripirium Rash and Other (See Comments)    Change in mental status    Patient Measurements: ~45 kg  Vital Signs: Temp: 100 F (37.8 C) (10/18 1425) Temp Source: Oral (10/18 1425) BP: 118/67 mmHg (10/18 1425) Pulse Rate: 94 (10/18 1425)  Labs:  Recent Labs  07/25/15 0312  WBC 19.9*  HGB 9.3*  PLT 649*  CREATININE 0.53*   Estimated Creatinine Clearance: 101.1 mL/min (by C-G formula based on Cr of 0.53).  Assessment: Mr. Danielski known to pharmacy from previous dosing of Vancomycin. Recent course of Vancomycin discontinued 10/12. Vancomycin restarted at last known dose - to check VT@SS  tonight.  Sepsis due to MDR pseudomonas UTI vs. Sacral decub? Has recurrent pelvic osteomyelitis, CT of the pelvis - negative for  active osteomyelitis but has hx of MRSA in deep tissue. CT with contrast showed: sacral decub ulcer with questionable infection. ID recommends discussing with plastic surgery. ID would like to continue vanc. Tm 100.4, currently afeb, WBC 19.9 stable. Was on doxy PTA.  Vancomycin 10/3 >>10/12, 10/15 >    10/9 VT = 12  Primaxin 10/3 >> 10/11 Fosfomycin x 3 doses per ID  10/2 Blood - ngf 10/3 Blood - ngtd 10/3 urine - > 100k of Pseudomonas (sensitive to Ceftaz and Amikacin only)   Goal of Therapy:  Vancomycin trough level 15-20 mcg/ml  Plan: Vancomycin 750 mg IV q24h Check VT at Css (10/18 @2230 ), renal fx, duration of therapy No new labs - check BMET tomorrow  Elicia Lamp, PharmD Clinical Pharmacist Pager [phone] 07/26/2015 2:39 PM

## 2015-07-26 NOTE — Progress Notes (Signed)
[facility]    Subjective:  He wants to go back to a regular diet which is what he eats at home rather than a carbo hydrate modified diet   Antibiotics:  Anti-infectives    Start     Dose/Rate Route Frequency Ordered Stop   07/23/15 2300  vancomycin (VANCOCIN) IVPB 750 mg/150 ml premix     750 mg 150 mL/hr over 60 Minutes Intravenous Every 24 hours 07/23/15 2215     07/18/15 0000  vancomycin (VANCOCIN) IVPB 750 mg/150 ml premix     750 mg 150 mL/hr over 60 Minutes Intravenous Every 24 hours 07/17/15 0508 07/19/15 0123   07/17/15 0100  vancomycin (VANCOCIN) 500 mg in sodium chloride 0.9 % 100 mL IVPB  Status:  Discontinued     500 mg 100 mL/hr over 60 Minutes Intravenous Every 24 hours 07/16/15 1016 07/17/15 0507   07/13/15 2000  imipenem-cilastatin (PRIMAXIN) 250 mg in sodium chloride 0.9 % 100 mL IVPB     250 mg 200 mL/hr over 30 Minutes Intravenous 3 times per day 07/13/15 1046 07/19/15 2256   07/11/15 2200  imipenem-cilastatin (PRIMAXIN) 250 mg in sodium chloride 0.9 % 100 mL IVPB  Status:  Discontinued     250 mg 200 mL/hr over 30 Minutes Intravenous Every 12 hours 07/11/15 1020 07/13/15 1046   07/10/15 2230  imipenem-cilastatin (PRIMAXIN) 250 mg in sodium chloride 0.9 % 100 mL IVPB  Status:  Discontinued     250 mg 200 mL/hr over 30 Minutes Intravenous 3 times per day 07/10/15 2226 07/11/15 1020   07/10/15 2230  vancomycin (VANCOCIN) 500 mg in sodium chloride 0.9 % 100 mL IVPB  Status:  Discontinued     500 mg 100 mL/hr over 60 Minutes Intravenous Every 24 hours 07/10/15 2226 07/16/15 1016      Medications: Scheduled Meds: . baclofen  10 mg Oral 2 times per day  . baclofen  20 mg Oral QHS  . chlorhexidine gluconate  15 mL Mouth Rinse BID  . collagenase   Topical Daily  . enoxaparin (LOVENOX) injection  30 mg Subcutaneous Daily  . feeding supplement (VITAL AF 1.2 CAL)  1,000 mL Per Tube Q24H  . fentaNYL  50 mcg  Transdermal Q72H  . Influenza vac split quadrivalent PF  0.5 mL Intramuscular Tomorrow-1000  . insulin aspart  0-15 Units Subcutaneous 6 times per day  . [START ON 07/27/2015] insulin glargine  16 Units Subcutaneous Daily  . midodrine  30 mg Oral TID WC  . multivitamin with minerals  1 tablet Oral Daily  . pantoprazole sodium  40 mg Oral Daily  . pregabalin  100 mg Oral 3 times per day  . pregabalin  200 mg Oral QHS  . vancomycin  750 mg Intravenous Q24H   Continuous Infusions: . sodium chloride 10 mL/hr at 07/22/15 1722   PRN Meds:.acetaminophen **OR** acetaminophen, albuterol, LORazepam, mirtazapine, ondansetron **OR** ondansetron (ZOFRAN) IV, oxyCODONE, RESOURCE THICKENUP CLEAR    Objective: Weight change: 1 lb 12.2 oz (0.8 kg)  Intake/Output Summary (Last 24 hours) at 07/26/15 1535 Last data filed at 07/26/15 1432  Gross per 24 hour  Intake   1382 ml  Output   6400 ml  Net  -5018 ml   Blood pressure 118/67, pulse 94, temperature 100 F (37.8 C), temperature source Oral, resp. rate 18, height 5\' 7"  (1.702 m), weight 117 lb 12 oz (53.41 kg), SpO2 100 %.  Temp:  [99.1 F (37.3 C)-100.4 F (38 C)] 100 F (37.8 C) (10/18 1425) Pulse Rate:  [86-146] 94 (10/18 1425) Resp:  [16-20] 18 (10/18 1425) BP: (118-166)/(67-92) 118/67 mmHg (10/18 1425) SpO2:  [96 %-100 %] 100 % (10/18 1425) Weight:  [117 lb 12 oz (53.41 kg)] 117 lb 12 oz (53.41 kg) (10/18 0700)  Physical Exam: General: Alert and awake, oriented x3, not in any acute distress. HEENT: anicteric sclera, EOMI CVS regular rate, normal r,  Chest: clear to auscultation bilaterally, no wheezing, rales or rhonchi Abdomen: soft  nondistended, Skin: ulcers not examined  Neuro: quadraplegia  CBC: CBC Latest Ref Rng 07/25/2015 07/23/2015 07/21/2015  WBC 4.0 - 10.5 K/uL 19.9(H) 20.2(H) 23.5(H)  Hemoglobin 13.0 - 17.0 g/dL 9.3(L) 9.7(L) 10.2(L)  Hematocrit 39.0 - 52.0 % 30.2(L) 30.1(L) 32.1(L)  Platelets 150 - 400 K/uL  649(H) 418(H) 374       BMET  Recent Labs  07/25/15 0312  NA 130*  K 4.7  CL 97*  CO2 26  GLUCOSE 221*  BUN 12  CREATININE 0.53*  CALCIUM 8.4*     Liver Panel   Recent Labs  07/25/15 0312  PROT 6.6  ALBUMIN 1.8*  AST 60*  ALT 53  ALKPHOS 135*  BILITOT 0.4       Sedimentation Rate No results for input(s): ESRSEDRATE in the last 72 hours. C-Reactive Protein No results for input(s): CRP in the last 72 hours.  Micro Results: Recent Results (from the past 720 hour(s))  Blood culture (routine x 2)     Status: None   Collection Time: 07/10/15  4:25 PM  Result Value Ref Range Status   Specimen Description BLOOD LEFT ARM  Final   Special Requests BOTTLES DRAWN AEROBIC AND ANAEROBIC 5CC  Final   Culture NO GROWTH 5 DAYS  Final   Report Status 07/15/2015 FINAL  Final  MRSA PCR Screening     Status: None   Collection Time: 07/11/15  1:00 AM  Result Value Ref Range Status   MRSA by PCR NEGATIVE NEGATIVE Final    Comment:        The GeneXpert MRSA Assay (FDA approved for NASAL specimens only), is one component of a comprehensive MRSA colonization surveillance program. It is not intended to diagnose MRSA infection nor to guide or monitor treatment for MRSA infections.   Blood culture (routine x 2)     Status: None   Collection Time: 07/11/15  4:19 AM  Result Value Ref Range Status   Specimen Description BLOOD RIGHT FOREARM  Final   Special Requests BOTTLES DRAWN AEROBIC ONLY 2CC  Final   Culture NO GROWTH 5 DAYS  Final   Report Status 07/16/2015 FINAL  Final  Urine culture     Status: None   Collection Time: 07/11/15  5:09 PM  Result Value Ref Range Status   Specimen Description URINE, RANDOM  Final   Special Requests NONE  Final   Culture   Final    >=100,000 COLONIES/mL PSEUDOMONAS AERUGINOSA AMIKACIN MIC=8 SENSITIVE >=100,000 COLONIES/mL YEAST    Report Status 07/21/2015 FINAL  Final   Organism ID, Bacteria PSEUDOMONAS AERUGINOSA  Final       Susceptibility   Pseudomonas aeruginosa - MIC*    CEFTAZIDIME 8 SENSITIVE Sensitive     CIPROFLOXACIN >=4 RESISTANT Resistant     GENTAMICIN >=16 RESISTANT Resistant     IMIPENEM >=16 RESISTANT Resistant     CEFEPIME 16 INTERMEDIATE Intermediate     AZTREONAM Value in  next row       >=16RESISTANT    * >=100,000 COLONIES/mL PSEUDOMONAS AERUGINOSA  Culture, blood (routine x 2)     Status: None (Preliminary result)   Collection Time: 07/23/15 10:35 PM  Result Value Ref Range Status   Specimen Description BLOOD RIGHT ARM  Final   Special Requests IN PEDIATRIC BOTTLE 3CC  Final   Culture NO GROWTH 3 DAYS  Final   Report Status PENDING  Incomplete  Culture, blood (routine x 2)     Status: None (Preliminary result)   Collection Time: 07/23/15 10:45 PM  Result Value Ref Range Status   Specimen Description BLOOD RIGHT ANTECUBITAL  Final   Special Requests BOTTLES DRAWN AEROBIC AND ANAEROBIC 5CC   Final   Culture NO GROWTH 3 DAYS  Final   Report Status PENDING  Incomplete    Studies/Results: Ct Abdomen Pelvis W Contrast  07/25/2015  CLINICAL DATA:  GERD fever of unknown origin, recent MRSA infection, strep bacteremia, respiratory failure, admitted with hallucinations and confusion, question UTI ; history spinal cord injury and paraplegia, type I diabetes mellitus EXAM: CT ABDOMEN AND PELVIS WITH CONTRAST TECHNIQUE: Multidetector CT imaging of the abdomen and pelvis was performed using the standard protocol following bolus administration of intravenous contrast. Sagittal and coronal MPR images reconstructed from axial data set. CONTRAST:  140mL OMNIPAQUE IOHEXOL 300 MG/ML SOLN IV. Dilute oral contrast. COMPARISON:  07/11/2015 FINDINGS: Infiltrate RIGHT lower lobe. Mild atelectasis LEFT lower lobe. Peribronchial thickening in both lower lobes with RIGHT lower lobe bronchiectasis. Contracted gallbladder. Scarring at medial aspect of inferior pole RIGHT kidney unchanged. Liver, spleen, pancreas,  kidneys, and adrenal glands otherwise unremarkable. IVC filter and gastrostomy tube. Normal appendix. Question bowel wall thickening involving the cecum and ascending colon versus artifact from underdistention. LEFT lower quadrant descending colostomy. Stomach and remaining bowel loops unremarkable. Bladder wall thickening, question related to chronic suprapubic catheterization in urinary tract infection versus chronic outlet obstruction. LEFT ureter distended though this could be related to bladder distention. Vascular structures grossly patent. Large sacral decubitus ulcer with prior distal sacrococcygeal destruction or resection. Foci of soft tissue gas within LEFT gluteus maximus question penetration by decubitus ulcer and infection. Diffuse soft tissue edema. Additional foci of nonspecific soft tissue gas RIGHT lateral abdomen. No mass, adenopathy, free air or free fluid. Bones demineralized. IMPRESSION: Questionable wall thickening of the cecum and ascending colon versus artifact from underdistention, unable to exclude colitis. Diffuse bladder wall thickening which may be related to chronic catheterization/infection or outlet obstruction. Diffuse soft tissue edema which can be seen with anasarca and hypoproteinemia. Peribronchial thickening in both lower lobes with RIGHT lower lobe bronchiectasis and RIGHT lower lobe infiltrate. Sacral decubitus ulcer with prior distal sacrococcygeal destruction or resection and foci of gas within the LEFT gluteus maximus question infection. Additional nonspecific foci of soft tissue gas in the RIGHT lateral abdominal wall soft tissue planes. Electronically Signed   By: Lavonia Dana M.D.   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 male with  With spinal cord infarct large decubitus ulcers, recent MRSA and  micro-aerophilic streptococcal bacteremia, respiratory failure requiring tracheostomy complicated by MDR Acinetobacter, MDR pseudomonas who has had yet another admission with septic picture protracted hospilal course with IV vancomycin, imipenem, po fosfomycin.   # 1 Sepsis: not clear what source was. Pt stated his urine had begun "looking dirty" but he only got fosfomycin that was active vs this organism, he now feels much better. WOC stated that his wounds were looking much improved at the bedside. His CT without contrast was unrevealing CT WITH contrast and this showed an area   Sacral decubitus ulcer with prior distal sacrococcygeal destruction or resection and foci of gas within the LEFT gluteus maximus question infection.  We will need input from  Plastic Surgery. I cannot find record of sacrococcygeal resection  IF this is NEW and this is the source of his sepsis then we would better able to direct his IV antibiotics with deep cultures from area of bone involved.  Would continue IV vancomycin  for now and NOT broaden this at all until unless we have ne culture based target.        LOS: 16 days   [name] 07/26/2015, 3:35 PM

## 2015-07-26 NOTE — Telephone Encounter (Signed)
He takes 100 mg TID and 200 mg qhs. Refill both for 6 months

## 2015-07-26 NOTE — Telephone Encounter (Signed)
No he is still in patient

## 2015-07-26 NOTE — Progress Notes (Signed)
Inpatient Diabetes Program Recommendations  AACE/ADA: New Consensus Statement on Inpatient Glycemic Control (2015)  Target Ranges:  Prepandial:   less than 140 mg/dL      Peak postprandial:   less than 180 mg/dL (1-2 hours)      Critically ill patients:  140 - 180 mg/dL  Results for ARMANIE, MARTINE (MRN [id number]) as of 07/26/2015 09:45  Ref. Range 07/25/2015 07:42 07/25/2015 11:51 07/25/2015 16:24 07/25/2015 20:14 07/25/2015 22:33 07/26/2015 00:02 07/26/2015 04:33 07/26/2015 07:45  Glucose-Capillary Latest Ref Range: 65-99 mg/dL 238 (H) 370 (H) 101 (H) 171 (H) 287 (H) 300 (H) 276 (H) 179 (H)   Review of Glycemic Control  Diabetes history: DM1 Current orders for Inpatient glycemic control: Lantus 15 units daily, Novolog 0-15 units Q4H  Inpatient Diabetes Program Recommendations: Insulin - Basal: Please consider increasing Lantus to 17 units daily. Insulin - Meal Coverage: Please consider ordering Novolog 4 units TID with meals for meal coverage if patient eats at least 50% of meal. Also, please consider ordering Novolog 4 units BID (scheduled for 12 midnight and 4am to cover carbohydrates from nocturnal  tube feeds). Diet: If appropriate, please consider changing diet from Regular to Carb Modified.  Thanks, Barnie Alderman, RN, MSN, CCRN, CDE Diabetes Coordinator Inpatient Diabetes Program [phone] (Team Pager from [place] to [place]) [phone] ([facility] office) [phone] [facility] office) [phone] [facility] office)

## 2015-07-27 ENCOUNTER — Inpatient Hospital Stay (HOSPITAL_COMMUNITY): Payer: Medicaid Other

## 2015-07-27 LAB — URINE CULTURE

## 2015-07-27 LAB — GLUCOSE, CAPILLARY
GLUCOSE-CAPILLARY: 151 mg/dL — AB (ref 65–99)
GLUCOSE-CAPILLARY: 197 mg/dL — AB (ref 65–99)
GLUCOSE-CAPILLARY: 199 mg/dL — AB (ref 65–99)
GLUCOSE-CAPILLARY: 65 mg/dL (ref 65–99)
GLUCOSE-CAPILLARY: 66 mg/dL (ref 65–99)
GLUCOSE-CAPILLARY: 91 mg/dL (ref 65–99)
Glucose-Capillary: 222 mg/dL — ABNORMAL HIGH (ref 65–99)
Glucose-Capillary: 152 mg/dL — ABNORMAL HIGH (ref 65–99)

## 2015-07-27 LAB — CBC
HCT: 30.1 % — ABNORMAL LOW (ref 39.0–52.0)
Hemoglobin: 10 g/dL — ABNORMAL LOW (ref 13.0–17.0)
MCH: 27.9 pg (ref 26.0–34.0)
MCHC: 33.2 g/dL (ref 30.0–36.0)
MCV: 83.8 fL (ref 78.0–100.0)
PLATELETS: 572 10*3/uL — AB (ref 150–400)
RBC: 3.59 MIL/uL — ABNORMAL LOW (ref 4.22–5.81)
RDW: 15.3 % (ref 11.5–15.5)
WBC: 18 10*3/uL — AB (ref 4.0–10.5)

## 2015-07-27 LAB — BASIC METABOLIC PANEL
ANION GAP: 10 (ref 5–15)
BUN: 17 mg/dL (ref 6–20)
CALCIUM: 8.8 mg/dL — AB (ref 8.9–10.3)
CO2: 24 mmol/L (ref 22–32)
Chloride: 97 mmol/L — ABNORMAL LOW (ref 101–111)
Creatinine, Ser: 0.83 mg/dL (ref 0.61–1.24)
GFR calc Af Amer: >60 mL/min (ref >60)
GFR calc non Af Amer: >60 mL/min (ref >60)
GLUCOSE: 198 mg/dL — AB (ref 65–99)
Potassium: 4.4 mmol/L (ref 3.5–5.1)
SODIUM: 131 mmol/L — AB (ref 135–145)

## 2015-07-27 LAB — URINALYSIS, ROUTINE W REFLEX MICROSCOPIC
Bilirubin Urine: NEGATIVE
Glucose, UA: NEGATIVE mg/dL
Ketones, ur: NEGATIVE mg/dL
Nitrite: POSITIVE — AB
Protein, ur: NEGATIVE mg/dL
Specific Gravity, Urine: 1.01 (ref 1.005–1.030)
Urobilinogen, UA: 0.2 mg/dL (ref 0.0–1.0)
pH: 6 (ref 5.0–8.0)

## 2015-07-27 LAB — URINE MICROSCOPIC-ADD ON

## 2015-07-27 MED ORDER — DEXTROSE 50 % IV SOLN
50.0000 mL | Freq: Once | INTRAVENOUS | Status: AC
  Administered 2015-07-27: 50 mL via INTRAVENOUS

## 2015-07-27 MED ORDER — SODIUM CHLORIDE 0.9 % IV BOLUS (SEPSIS)
500.0000 mL | Freq: Once | INTRAVENOUS | Status: AC
  Administered 2015-07-27: 500 mL via INTRAVENOUS

## 2015-07-27 MED ORDER — VANCOMYCIN HCL 500 MG IV SOLR
500.0000 mg | Freq: Two times a day (BID) | INTRAVENOUS | Status: AC
  Administered 2015-07-27 – 2015-07-29 (×5): 500 mg via INTRAVENOUS
  Filled 2015-07-27 (×5): qty 500

## 2015-07-27 MED ORDER — ACETAMINOPHEN 325 MG PO TABS
325.0000 mg | ORAL_TABLET | Freq: Once | ORAL | Status: AC
  Administered 2015-07-27: 325 mg via ORAL

## 2015-07-27 MED ORDER — ENOXAPARIN SODIUM 40 MG/0.4ML ~~LOC~~ SOLN
40.0000 mg | Freq: Every day | SUBCUTANEOUS | Status: DC
  Administered 2015-07-28 – 2015-08-16 (×20): 40 mg via SUBCUTANEOUS
  Filled 2015-07-27 (×21): qty 0.4

## 2015-07-27 MED ORDER — DEXTROSE 50 % IV SOLN
INTRAVENOUS | Status: AC
  Administered 2015-07-27: 50 mL via INTRAVENOUS
  Filled 2015-07-27: qty 50

## 2015-07-27 MED ORDER — DEXTROSE 50 % IV SOLN
INTRAVENOUS | Status: AC
  Filled 2015-07-27: qty 50

## 2015-07-27 NOTE — Progress Notes (Signed)
Nutrition Follow-up  DOCUMENTATION CODES:   Underweight, Severe malnutrition in context of chronic illness  INTERVENTION:    Continue MVI  Continue regular diet  Continue Vital AF 1.2 formula at 60 ml/hr from 6 PM to 6 AM. Total time is 12 hours.  TF regimen to provide 864 kcals, 54 gm protein, 584 ml of free water  NUTRITION DIAGNOSIS:   Inadequate oral intake related to inability to eat as evidenced by  (PO intake </= 50%).  Ongoing  GOAL:   Patient will meet greater than or equal to 90% of their needs  Progressing  MONITOR:   TF tolerance, PO intake, Labs, Weight trends, Skin, I & O's  REASON FOR ASSESSMENT:   Ventilator, Other (Comment) (New Tube Feeding)    ASSESSMENT:   [name] with PMH of spinal cord injury s/p paraplegia who was recently admitted for MRSA and microaerophilic strep bacteremia with respiratory failure requiring trach (vent at night); brought to ER after patient was found to have a hallucinations and confusion. Patient was recently tapered off his IV antibiotics and was placed on doxycycline and Cipro was started 3 days ago for possible UTI. In ER patient UA showed features concerning for UTI and patient has chronic suprapubic catheter. Patient has been admitted for possible developing sepsis.   Pt now on room air. Transferred from SDU to surgical floor on 07/22/15.   Pt was advanced to a regular consistency diet by SLP on 07/20/15, due to MBSS and vast improvement in swallow function. Pt diet was liberalized from carb modified to regular, per pt request on 07/26/15. Intake improving. PO: 25-100%.   Reviewed COWRN note from 07/25/15; wounds are improving. Hydrotherapy was d/c on 07/25/15, due to pt received maximum benefit from therapy. Wet to dry dressings continue. Reviewed surgery notes, which recommended hold off on wound vac placement. ID following for chronic osteomyelitis; remains on IV antibiotics. Plastics consult pending.   Pt  remains on nocturnal feeding regimen of Vital AF 1.2 via PEG (6 PM to 6 AM) providing 864 kcals, 54 gm protein, 584 ml of free water. Tolerating well.  Labs reviewed.   Diet Order:  Diet regular Room service appropriate?: Yes; Fluid consistency:: Thin  Skin:    Left plantar heel with stage 2 pressure injury Left plantar foot near heel with unstageable pressure injury Left anterior foot near small toe stage 2 pressure injury  Left outer foot stage 2 pressure injury  Right ischium with stage 4 pressure injury Left ischium with stage 4 pressure injury Left hip with stage 4 pressure injury Left posterior back with chronic full thickness wound  Last BM:  07/27/15  Height:   Ht Readings from Last 1 Encounters:  07/13/15 5\' 7"  (1.702 m)    Weight:   Wt Readings from Last 1 Encounters:  07/27/15 119 lb 3.2 oz (54.069 kg)    Ideal Body Weight:  70 kg  BMI:  Body mass index is 18.67 kg/(m^2).  Estimated Nutritional Needs:   Kcal:  1700-1900  Protein:  90-100 gm  Fluid:  1.7-1.9 L  EDUCATION NEEDS:   No education needs identified at this time  Jason [name] [name], RD, LDN, CDE Pager: [phone] After hours Pager: [phone]

## 2015-07-27 NOTE — Progress Notes (Signed)
PROGRESS NOTE    Jason [name] XKG:[id number] DOB: 22-Nov-1985 DOA: 07/10/2015 PCP: [name], MD  HPI/Brief narrative 29 y.o. 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 Patient was recently tapered off his IV antibiotics and was placed on doxycycline and Cipro 3 days prior to this admit for possible UTI.   In the ER patient UA shows features concerning for UTI - patient has chronic suprapubic catheter. Patient was also found to be hypothermic and hypotensive. Patient has had previous history of multiple debridement for chronic sacral decubitus which at this time looks to be healing. Patient also has history of chronic osteomyelitis.    Assessment/Plan:  Toxic metabolic encephalopathy Resolved - patient is presently alert and oriented  Sepsis due to MDR Pseudomonas Aeruginosa UTI ?  Vs decubitus- still having fevers Status post third dose of fosfomycin 10/10 per ID recommendations  - suprapubic catheter changed 10/14?  - ID follow-up appreciated: CT Scan showed: Sacral decubitus ulcer with prior distal sacrococcygeal destruction or resection and foci of gas within the LEFT gluteus maximus question infection. -plastic surgery consulted 10/18- Dr. Migdalia Dk will see  Acute renal failure Due to sepsis - renal function has normalized  Hyponatremia  Due initially to hypovolemia - resolved w/ volume resuscitation but is now recurring - decrease tube feeding and follow. Stable. Some of this is due to hyperglycemia  Hypomagnesemia  Replaced to goal of 2.0  Diabetes mellitus type 1 with hyperglycemia CBGs are peaking at night when tube feeding is begun and then improving during the day when patient is on oral diet  - -increase lantus to 16   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 cont hydrotherapy to wounds as per general surgery recommendations - antibiotic reinitiated due to concern for sepsis. ID following. Wound care consultation. -plastic consult  Chronic anemia Hgb initially dropped w/ volume expansion - s/p 2U PRBC - no evidence of acute blood loss - hemoglobin appears stable at this time   Chronic respiratory failure with trach and vent pulmonary critical care supposed to be managing vent - it appears the patient took himself off the vent 10/7 and has not been back on since - currently appears stable on 28% trach collar  - As per PCCM ELink note 10/3, it appears that patient is followed in OP trach clinic> will defer further trach decisions to OP follow up in that clinic. - Discussed with CCM M.D. on call 10/16: Agrees with outpatient follow-up with trach clinic and indicates that he may not be decannulated.   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 IVC filter placed 8/6  History of spinal canal injury with paraplegia  Severe malnutrition in context of chronic illness, Underweight patient now able to eat and cleared for regular diet - transitioned to nocturnal tube feeding regimen - decrease tube feed rate QHS as discussed above  Leukocytosis - trend  DVT Prophylaxis: Lovenox Code Status: Full Family Communication: no family at bedside Disposition Plan: DC home when stable   Consultants:  PCCM   Infectious disease  Plastics- [facility]  Procedures:  None  Antibiotics:  Imipenem 10/2 > 10/11  Vanc 10/2 > 10/11  Fosfomycin 10/6 > 10/10  Vancomycin 10/15 >   Subjective: Still having fevers Eating well Pain controlled  Objective: [employer]  Vitals:   07/27/15 0318 07/27/15 0341 07/27/15 0555 07/27/15 0827  BP:   103/66   Pulse: 79  92 80  Temp:  101.8 F (38.8 C) 101.8 F (38.8 C)   TempSrc:  Oral Oral   Resp: 17  18 18   Height:      Weight:   54.069 kg (119 lb 3.2 oz)    SpO2: 99%  97% 99%    Intake/Output Summary (Last 24 hours) at 07/27/15 1057 Last data filed at 07/27/15 0700  Gross per 24 hour  Intake   1618 ml  Output   3475 ml  Net  -1857 ml   Filed Weights   07/25/15 0443 07/26/15 0700 07/27/15 0555  Weight: 52.61 kg (115 lb 15.7 oz) 53.41 kg (117 lb 12 oz) 54.069 kg (119 lb 3.2 oz)     Exam:  General exam: Pleasant young male sitting up in bed comfortably. Trach Respiratory system: Clear. No increased work of breathing. Cardiovascular system: S1 & S2 heard, RRR. No JVD, murmurs, gallops, clicks or pedal edema. Gastrointestinal system: Abdomen is nondistended, soft and nontender. Normal bowel sounds heard.- ostomy functioning Central nervous system: Alert and oriented. No focal neurological deficits. Extremities: Quadriparesis- unchanged    Data Reviewed: Basic Metabolic Panel:  Recent Labs Lab 07/21/15 0339 07/23/15 0507 07/25/15 0312 07/27/15 0055  NA 131* 129* 130* 131*  K 4.7 4.3 4.7 4.4  CL 103 99* 97* 97*  CO2 22 23 26 24   GLUCOSE 135* 229* 221* 198*  BUN 14 13 12 17   CREATININE 0.50* 0.54* 0.53* 0.83  CALCIUM 8.5* 8.3* 8.4* 8.8*  MG 2.0  --   --   --    Liver Function Tests:  Recent Labs Lab 07/25/15 0312  AST 60*  ALT 53  ALKPHOS 135*  BILITOT 0.4  PROT 6.6  ALBUMIN 1.8*   No results for input(s): LIPASE, AMYLASE in the last 168 hours. No results for input(s): AMMONIA in the last 168 hours. CBC:  Recent Labs Lab 07/21/15 0339 07/23/15 0507 07/25/15 0312 07/27/15 0055  WBC 23.5* 20.2* 19.9* 18.0*  HGB 10.2* 9.7* 9.3* 10.0*  HCT 32.1* 30.1* 30.2* 30.1*  MCV 85.8 86.2 86.3 83.8  PLT 374 418* 649* 572*   Cardiac Enzymes: No results for input(s): CKTOTAL, CKMB, CKMBINDEX, TROPONINI in the last 168 hours. BNP (last 3 results) No results for input(s): PROBNP in the last 8760 hours. CBG:  Recent Labs Lab 07/26/15 2342 07/27/15 0015 07/27/15 0058 07/27/15 0339 07/27/15 0739  GLUCAP 66 66  197* 199* 222*    Recent Results (from the past 240 hour(s))  Culture, blood (routine x 2)     Status: None (Preliminary result)   Collection Time: 07/23/15 10:35 PM  Result Value Ref Range Status   Specimen Description BLOOD RIGHT ARM  Final   Special Requests IN PEDIATRIC BOTTLE 3CC  Final   Culture NO GROWTH 3 DAYS  Final   Report Status PENDING  Incomplete  Culture, blood (routine x 2)     Status: None (Preliminary result)   Collection Time: 07/23/15 10:45 PM  Result Value Ref Range Status   Specimen Description BLOOD RIGHT ANTECUBITAL  Final   Special Requests BOTTLES DRAWN AEROBIC AND ANAEROBIC 5CC   Final   Culture NO GROWTH 3 DAYS  Final   Report Status PENDING  Incomplete           Studies: Ct Abdomen Pelvis W Contrast  07/25/2015  CLINICAL DATA:  GERD fever of  unknown origin, recent MRSA infection, strep bacteremia, respiratory failure, admitted with hallucinations and confusion, question UTI ; history spinal cord injury and paraplegia, type I diabetes mellitus EXAM: CT ABDOMEN AND PELVIS WITH CONTRAST TECHNIQUE: Multidetector CT imaging of the abdomen and pelvis was performed using the standard protocol following bolus administration of intravenous contrast. Sagittal and coronal MPR images reconstructed from axial data set. CONTRAST:  126mL OMNIPAQUE IOHEXOL 300 MG/ML SOLN IV. Dilute oral contrast. COMPARISON:  07/11/2015 FINDINGS: Infiltrate RIGHT lower lobe. Mild atelectasis LEFT lower lobe. Peribronchial thickening in both lower lobes with RIGHT lower lobe bronchiectasis. Contracted gallbladder. Scarring at medial aspect of inferior pole RIGHT kidney unchanged. Liver, spleen, pancreas, kidneys, and adrenal glands otherwise unremarkable. IVC filter and gastrostomy tube. Normal appendix. Question bowel wall thickening involving the cecum and ascending colon versus artifact from underdistention. LEFT lower quadrant descending colostomy. Stomach and remaining bowel loops  unremarkable. Bladder wall thickening, question related to chronic suprapubic catheterization in urinary tract infection versus chronic outlet obstruction. LEFT ureter distended though this could be related to bladder distention. Vascular structures grossly patent. Large sacral decubitus ulcer with prior distal sacrococcygeal destruction or resection. Foci of soft tissue gas within LEFT gluteus maximus question penetration by decubitus ulcer and infection. Diffuse soft tissue edema. Additional foci of nonspecific soft tissue gas RIGHT lateral abdomen. No mass, adenopathy, free air or free fluid. Bones demineralized. IMPRESSION: Questionable wall thickening of the cecum and ascending colon versus artifact from underdistention, unable to exclude colitis. Diffuse bladder wall thickening which may be related to chronic catheterization/infection or outlet obstruction. Diffuse soft tissue edema which can be seen with anasarca and hypoproteinemia. Peribronchial thickening in both lower lobes with RIGHT lower lobe bronchiectasis and RIGHT lower lobe infiltrate. Sacral decubitus ulcer with prior distal sacrococcygeal destruction or resection and foci of gas within the LEFT gluteus maximus question infection. Additional nonspecific foci of soft tissue gas in the RIGHT lateral abdominal wall soft tissue planes. Electronically Signed   By: Lavonia Dana M.D.   On: 07/25/2015 17:46   Dg Chest Port 1 View  07/27/2015  CLINICAL DATA:  Tracheostomy and fever EXAM: PORTABLE CHEST 1 VIEW COMPARISON:  07/16/2015 FINDINGS: Tracheostomy tube in unchanged position over the tracheal air column. Heart size and vascular pattern are normal. There are mild bilateral lower lobe infiltrates. When compared to the prior study there both significantly improved. No pleural effusion. Stable mild expansile deformity distal right clavicle possibly due to prior trauma. IMPRESSION: Persistent but improved bilateral lower lobe infiltrates.  Electronically Signed   By: Skipper Cliche M.D.   On: 07/27/2015 08:09        Scheduled Meds: . baclofen  10 mg Oral 2 times per day  . baclofen  20 mg Oral QHS  . chlorhexidine gluconate  15 mL Mouth Rinse BID  . collagenase   Topical Daily  . enoxaparin (LOVENOX) injection  30 mg Subcutaneous Daily  . feeding supplement (VITAL AF 1.2 CAL)  1,000 mL Per Tube Q24H  . fentaNYL  50 mcg Transdermal Q72H  . Influenza vac split quadrivalent PF  0.5 mL Intramuscular Tomorrow-1000  . insulin aspart  0-15 Units Subcutaneous 6 times per day  . insulin glargine  16 Units Subcutaneous Daily  . midodrine  30 mg Oral TID WC  . multivitamin with minerals  1 tablet Oral Daily  . pantoprazole sodium  40 mg Oral Daily  . pregabalin  100 mg Oral 3 times per day  . pregabalin  200 mg  Oral QHS  . vancomycin  500 mg Intravenous Q12H   Continuous Infusions: . 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    Jiles Goya, DO. [facility] Pager [phone]  If 7PM-7AM, please contact night-coverage [email or web address] Password [email or web address] 07/27/2015, 10:57 AM    LOS: 17 days

## 2015-07-27 NOTE — Progress Notes (Signed)
Hypoglycemic Event  CBG: 65  Treatment: D50 IV 50 mL  Symptoms: None  Follow-up CBG: Time:2016 CBG Result:152  Possible Reasons for Event: Inadequate meal intake  Comments/MD notified:    Graceann Congress

## 2015-07-27 NOTE — Progress Notes (Signed)
ANTIBIOTIC CONSULT NOTE  Pharmacy Consult for Vancomycin Indication: osteomyelitis  Allergies  Allergen Reactions  . Cefuroxime Axetil Anaphylaxis  . Ertapenem Other (See Comments)    Rash and confusion-->tolerated Imipenem   . Morphine And Related Other (See Comments)    Changed mental status, confusion, headache, visual hallucination  . Penicillins Anaphylaxis and Other (See Comments)    Tolerated Imipenem; no reaction to 7 day course of amoxicillin in 2015 Has patient had a PCN reaction causing immediate rash, facial/tongue/throat swelling, SOB or lightheadedness with hypotension: Yes Has patient had a PCN reaction causing severe rash involving mucus membranes or skin necrosis: No Has patient had a PCN reaction that required hospitalization Yes Has patient had a PCN reaction occurring within the last 10 years: No If all of the above answers are "NO", then may proceed with Cephalo  . Sulfa Antibiotics Anaphylaxis, Shortness Of Breath and Other (See Comments)  . Tessalon [Benzonatate] Anaphylaxis  . Shellfish Allergy Itching and Other (See Comments)    Took benadryl to alleviate reaction  . Nsaids Other (See Comments)    Risk of bleeding  . Miripirium Rash and Other (See Comments)    Change in mental status    Patient Measurements: ~45 kg  Vital Signs: Temp: 101.5 F (38.6 C) (10/18 2343) Temp Source: Oral (10/18 1958) BP: 148/93 mmHg (10/18 1958) Pulse Rate: 87 (10/19 0008)  Labs:  Recent Labs  07/25/15 0312  WBC 19.9*  HGB 9.3*  PLT 649*  CREATININE 0.53*   Estimated Creatinine Clearance: 101.1 mL/min (by C-G formula based on Cr of 0.53).  Assessment: 29 y.o. male with sepsis for empiric antibiotics.  Vancomycin trough 14 tonight  Goal of Therapy:  Vancomycin trough level 15-20 mcg/ml  Plan: Change vancomycin 500 mg IV q12h with next dose.  Phillis Knack, PharmD, BCPS   07/27/2015 12:45 AM

## 2015-07-27 NOTE — Progress Notes (Signed)
Pt refused suction this am stating that when he wakes up he always coughs something up on his own. RT will continue to monitor.

## 2015-07-27 NOTE — Progress Notes (Signed)
RT note: RT tracheal suctioned copious thick yellow secretions. Patient tolerated well vital sign stable at this the.

## 2015-07-27 NOTE — Telephone Encounter (Signed)
Pt is currently in the hospital. Per Dr. Sarajane Jews, we will wait until pt arrives home before sending in scripts due to possible medication changes. I pt's home number and left a voice message with this information. I advised pt to call us back when he needs this medication and we will send in both scripts.

## 2015-07-27 NOTE — Progress Notes (Signed)
[facility]    Subjective:   Pt having dressing change   Antibiotics:  Anti-infectives    Start     Dose/Rate Route Frequency Ordered Stop   07/27/15 1800  vancomycin (VANCOCIN) 500 mg in sodium chloride 0.9 % 100 mL IVPB     500 mg 100 mL/hr over 60 Minutes Intravenous Every 12 hours 07/27/15 0048     07/23/15 2300  vancomycin (VANCOCIN) IVPB 750 mg/150 ml premix  Status:  Discontinued     750 mg 150 mL/hr over 60 Minutes Intravenous Every 24 hours 07/23/15 2215 07/27/15 0048   07/18/15 0000  vancomycin (VANCOCIN) IVPB 750 mg/150 ml premix     750 mg 150 mL/hr over 60 Minutes Intravenous Every 24 hours 07/17/15 0508 07/19/15 0123   07/17/15 0100  vancomycin (VANCOCIN) 500 mg in sodium chloride 0.9 % 100 mL IVPB  Status:  Discontinued     500 mg 100 mL/hr over 60 Minutes Intravenous Every 24 hours 07/16/15 1016 07/17/15 0507   07/13/15 2000  imipenem-cilastatin (PRIMAXIN) 250 mg in sodium chloride 0.9 % 100 mL IVPB     250 mg 200 mL/hr over 30 Minutes Intravenous 3 times per day 07/13/15 1046 07/19/15 2256   07/11/15 2200  imipenem-cilastatin (PRIMAXIN) 250 mg in sodium chloride 0.9 % 100 mL IVPB  Status:  Discontinued     250 mg 200 mL/hr over 30 Minutes Intravenous Every 12 hours 07/11/15 1020 07/13/15 1046   07/10/15 2230  imipenem-cilastatin (PRIMAXIN) 250 mg in sodium chloride 0.9 % 100 mL IVPB  Status:  Discontinued     250 mg 200 mL/hr over 30 Minutes Intravenous 3 times per day 07/10/15 2226 07/11/15 1020   07/10/15 2230  vancomycin (VANCOCIN) 500 mg in sodium chloride 0.9 % 100 mL IVPB  Status:  Discontinued     500 mg 100 mL/hr over 60 Minutes Intravenous Every 24 hours 07/10/15 2226 07/16/15 1016      Medications: Scheduled Meds: . baclofen  10 mg Oral 2 times per day  . baclofen  20 mg Oral QHS  . chlorhexidine gluconate  15 mL Mouth Rinse BID  . collagenase   Topical Daily  . [START ON 07/28/2015] enoxaparin  (LOVENOX) injection  40 mg Subcutaneous Daily  . feeding supplement (VITAL AF 1.2 CAL)  1,000 mL Per Tube Q24H  . fentaNYL  50 mcg Transdermal Q72H  . Influenza vac split quadrivalent PF  0.5 mL Intramuscular Tomorrow-1000  . insulin aspart  0-15 Units Subcutaneous 6 times per day  . insulin glargine  16 Units Subcutaneous Daily  . midodrine  30 mg Oral TID WC  . multivitamin with minerals  1 tablet Oral Daily  . pantoprazole sodium  40 mg Oral Daily  . pregabalin  100 mg Oral 3 times per day  . pregabalin  200 mg Oral QHS  . vancomycin  500 mg Intravenous Q12H   Continuous Infusions: . sodium chloride 10 mL/hr at 07/22/15 1722   PRN Meds:.acetaminophen **OR** acetaminophen, albuterol, LORazepam, mirtazapine, ondansetron **OR** ondansetron (ZOFRAN) IV, oxyCODONE, RESOURCE THICKENUP CLEAR    Objective: Weight change: 1 lb 7.2 oz (0.659 kg)  Intake/Output Summary (Last 24 hours) at 07/27/15 1650 Last data filed at 07/27/15 1600  Gross per 24 hour  Intake   4648 ml  Output   2750 ml  Net   1898 ml   Blood pressure 144/88, pulse 87, temperature 102.7  F (39.3 C), temperature source Oral, resp. rate 16, height 5\' 7"  (1.702 m), weight 119 lb 3.2 oz (54.069 kg), SpO2 93 %. Temp:  [101.1 F (38.4 C)-102.7 F (39.3 C)] 102.7 F (39.3 C) (10/19 1436) Pulse Rate:  [54-105] 87 (10/19 1503) Resp:  [16-18] 16 (10/19 1503) BP: (103-148)/(66-93) 144/88 mmHg (10/19 1436) SpO2:  [93 %-100 %] 93 % (10/19 1503) Weight:  [119 lb 3.2 oz (54.069 kg)] 119 lb 3.2 oz (54.069 kg) (10/19 0555)  Physical Exam: General: Alert and awake,   Skin:   07/27/15:      Neuro: no new deficits  CBC: CBC Latest Ref Rng 07/27/2015 07/25/2015 07/23/2015  WBC 4.0 - 10.5 K/uL 18.0(H) 19.9(H) 20.2(H)  Hemoglobin 13.0 - 17.0 g/dL 10.0(L) 9.3(L) 9.7(L)  Hematocrit 39.0 - 52.0 % 30.1(L) 30.2(L) 30.1(L)  Platelets 150 - 400 K/uL 572(H) 649(H) 418(H)       BMET  Recent Labs  07/25/15 0312  07/27/15 0055  NA 130* 131*  K 4.7 4.4  CL 97* 97*  CO2 26 24  GLUCOSE 221* 198*  BUN 12 17  CREATININE 0.53* 0.83  CALCIUM 8.4* 8.8*     Liver Panel   Recent Labs  07/25/15 0312  PROT 6.6  ALBUMIN 1.8*  AST 60*  ALT 53  ALKPHOS 135*  BILITOT 0.4       Sedimentation Rate No results for input(s): ESRSEDRATE in the last 72 hours. C-Reactive Protein No results for input(s): CRP in the last 72 hours.  Micro Results: Recent Results (from the past 720 hour(s))  Blood culture (routine x 2)     Status: None   Collection Time: 07/10/15  4:25 PM  Result Value Ref Range Status   Specimen Description BLOOD LEFT ARM  Final   Special Requests BOTTLES DRAWN AEROBIC AND ANAEROBIC 5CC  Final   Culture NO GROWTH 5 DAYS  Final   Report Status 07/15/2015 FINAL  Final  MRSA PCR Screening     Status: None   Collection Time: 07/11/15  1:00 AM  Result Value Ref Range Status   MRSA by PCR NEGATIVE NEGATIVE Final    Comment:        The GeneXpert MRSA Assay (FDA approved for NASAL specimens only), is one component of a comprehensive MRSA colonization surveillance program. It is not intended to diagnose MRSA infection nor to guide or monitor treatment for MRSA infections.   Blood culture (routine x 2)     Status: None   Collection Time: 07/11/15  4:19 AM  Result Value Ref Range Status   Specimen Description BLOOD RIGHT FOREARM  Final   Special Requests BOTTLES DRAWN AEROBIC ONLY 2CC  Final   Culture NO GROWTH 5 DAYS  Final   Report Status 07/16/2015 FINAL  Final  Urine culture     Status: None   Collection Time: 07/11/15  5:09 PM  Result Value Ref Range Status   Specimen Description URINE, RANDOM  Final   Special Requests NONE  Final   Culture   Final    >=100,000 COLONIES/mL PSEUDOMONAS AERUGINOSA AMIKACIN MIC=8 SENSITIVE COLISTIN MIC=1 SENSITIVE >=100,000 COLONIES/mL YEAST    Report Status 07/27/2015 FINAL  Final   Organism ID, Bacteria PSEUDOMONAS AERUGINOSA   Final      Susceptibility   Pseudomonas aeruginosa - MIC*    CEFTAZIDIME 8 SENSITIVE Sensitive     CIPROFLOXACIN >=4 RESISTANT Resistant     GENTAMICIN >=16 RESISTANT Resistant     IMIPENEM >=16 RESISTANT Resistant  CEFEPIME 16 INTERMEDIATE Intermediate     AZTREONAM Value in next row       >=16RESISTANT    * >=100,000 COLONIES/mL PSEUDOMONAS AERUGINOSA  Culture, blood (routine x 2)     Status: None (Preliminary result)   Collection Time: 07/23/15 10:35 PM  Result Value Ref Range Status   Specimen Description BLOOD RIGHT ARM  Final   Special Requests IN PEDIATRIC BOTTLE 3CC  Final   Culture NO GROWTH 4 DAYS  Final   Report Status PENDING  Incomplete  Culture, blood (routine x 2)     Status: None (Preliminary result)   Collection Time: 07/23/15 10:45 PM  Result Value Ref Range Status   Specimen Description BLOOD RIGHT ANTECUBITAL  Final   Special Requests BOTTLES DRAWN AEROBIC AND ANAEROBIC 5CC   Final   Culture NO GROWTH 4 DAYS  Final   Report Status PENDING  Incomplete    Studies/Results: Ct Abdomen Pelvis W Contrast  07/25/2015  CLINICAL DATA:  GERD fever of unknown origin, recent MRSA infection, strep bacteremia, respiratory failure, admitted with hallucinations and confusion, question UTI ; history spinal cord injury and paraplegia, type I diabetes mellitus EXAM: CT ABDOMEN AND PELVIS WITH CONTRAST TECHNIQUE: Multidetector CT imaging of the abdomen and pelvis was performed using the standard protocol following bolus administration of intravenous contrast. Sagittal and coronal MPR images reconstructed from axial data set. CONTRAST:  123mL OMNIPAQUE IOHEXOL 300 MG/ML SOLN IV. Dilute oral contrast. COMPARISON:  07/11/2015 FINDINGS: Infiltrate RIGHT lower lobe. Mild atelectasis LEFT lower lobe. Peribronchial thickening in both lower lobes with RIGHT lower lobe bronchiectasis. Contracted gallbladder. Scarring at medial aspect of inferior pole RIGHT kidney unchanged. Liver, spleen,  pancreas, kidneys, and adrenal glands otherwise unremarkable. IVC filter and gastrostomy tube. Normal appendix. Question bowel wall thickening involving the cecum and ascending colon versus artifact from underdistention. LEFT lower quadrant descending colostomy. Stomach and remaining bowel loops unremarkable. Bladder wall thickening, question related to chronic suprapubic catheterization in urinary tract infection versus chronic outlet obstruction. LEFT ureter distended though this could be related to bladder distention. Vascular structures grossly patent. Large sacral decubitus ulcer with prior distal sacrococcygeal destruction or resection. Foci of soft tissue gas within LEFT gluteus maximus question penetration by decubitus ulcer and infection. Diffuse soft tissue edema. Additional foci of nonspecific soft tissue gas RIGHT lateral abdomen. No mass, adenopathy, free air or free fluid. Bones demineralized. IMPRESSION: Questionable wall thickening of the cecum and ascending colon versus artifact from underdistention, unable to exclude colitis. Diffuse bladder wall thickening which may be related to chronic catheterization/infection or outlet obstruction. Diffuse soft tissue edema which can be seen with anasarca and hypoproteinemia. Peribronchial thickening in both lower lobes with RIGHT lower lobe bronchiectasis and RIGHT lower lobe infiltrate. Sacral decubitus ulcer with prior distal sacrococcygeal destruction or resection and foci of gas within the LEFT gluteus maximus question infection. Additional nonspecific foci of soft tissue gas in the RIGHT lateral abdominal wall soft tissue planes. Electronically Signed   By: Lavonia Dana M.D.   On: 07/25/2015 17:46   Dg Chest Port 1 View  07/27/2015  CLINICAL DATA:  Tracheostomy and fever EXAM: PORTABLE CHEST 1 VIEW COMPARISON:  07/16/2015 FINDINGS: Tracheostomy tube in unchanged position over the tracheal air column. Heart size and vascular pattern are normal. There  are mild bilateral lower lobe infiltrates. When compared to the prior study there both significantly improved. No pleural effusion. Stable mild expansile deformity distal right clavicle possibly due to prior trauma. IMPRESSION: Persistent but  improved bilateral lower lobe infiltrates. Electronically Signed   By: Skipper Cliche M.D.   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 Pt stated his urine had begun "looking dirty" but he only got fosfomycin that was active vs this organism, he now feels much better. WOC stated that his wounds were looking much improved at the bedside. His CT without contrast  was unrevealing CT WITH contrast and this showed an area   Sacral decubitus ulcer with prior distal sacrococcygeal destruction or resection and foci of gas within the LEFT gluteus maximus question infection.  Looks to be NEW and due to infection progressing  He will need help from Plastic surgery to debride this area and it will help [name] in ID if we have [place], TISSUE to help guide therapy esp give his hx of MDRO        LOS: 17 days   [name] 07/27/2015, 4:50 PM

## 2015-07-27 NOTE — Progress Notes (Signed)
Pt having episodes of desatting into mid 80's.  Hard to awaken pt at first, but after sternal rub, pt aroused and attempted to cough for me, not producing anything.  Pt sounds rhonchorous and attempted to deep suction pt.  No secretions suctioned up.  Changed inner canula at this time.  Reapplied passey muir and placed pt on 2L [place].  RT aware of changes.  Oncoming nurse aware of changes.

## 2015-07-28 LAB — CULTURE, BLOOD (ROUTINE X 2)
CULTURE: NO GROWTH
CULTURE: NO GROWTH

## 2015-07-28 LAB — GLUCOSE, CAPILLARY
GLUCOSE-CAPILLARY: 205 mg/dL — AB (ref 65–99)
GLUCOSE-CAPILLARY: 219 mg/dL — AB (ref 65–99)
GLUCOSE-CAPILLARY: 248 mg/dL — AB (ref 65–99)
GLUCOSE-CAPILLARY: 297 mg/dL — AB (ref 65–99)
Glucose-Capillary: 185 mg/dL — ABNORMAL HIGH (ref 65–99)
Glucose-Capillary: 204 mg/dL — ABNORMAL HIGH (ref 65–99)
Glucose-Capillary: 193 mg/dL — ABNORMAL HIGH (ref 65–99)

## 2015-07-28 MED ORDER — INSULIN GLARGINE 100 UNIT/ML ~~LOC~~ SOLN
20.0000 [IU] | Freq: Every day | SUBCUTANEOUS | Status: DC
  Administered 2015-07-29 – 2015-08-05 (×8): 20 [IU] via SUBCUTANEOUS
  Filled 2015-07-28 (×10): qty 0.2

## 2015-07-28 NOTE — Progress Notes (Signed)
Patient seen for trach team follow up.  No education needed at this time.  All needed equipment is at bedside.  Will continue to follow.

## 2015-07-28 NOTE — Consult Note (Signed)
Reason for Consult: Sacral ulcer with possible muscle infection Referring Physician: Dr. Flossie Dibble  Jason [name] is an 29 y.o. male.  HPI: The patient is a 29 yrs old bm well known to the service here treatment of sepsis and UTI.  He is doing much better and is on the regular unit.  He has a sacral / ischial ulcer that looks slightly improved from last visit.  However the CT is concerning for air or abscess within the muscle layers.  Nothing is noted to this effect on exam.  Past Medical History  Diagnosis Date  . GERD (gastroesophageal reflux disease)   . Asthma   . Hx MRSA infection     on face  . Gastroparesis   . Diabetic neuropathy ([place])   . Seizures ([place])   . Family history of anesthesia complication     Pt mother can't have epidural procedures  . Dysrhythmia   . Pneumonia   . Arthritis   . Fibromyalgia   . 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 Type I diabetes mellitus ([place])     sees Dr. Loanne Drilling   . Syncope 02/16/2015    Past Surgical History  Procedure Laterality Date  . Tonsillectomy    . Multiple extractions with alveoloplasty N/A 08/03/2014    Procedure: MULTIPLE EXTRACTIONS;  Surgeon: Gae Bon, DDS;  Location: [place];  Service: Oral Surgery;  Laterality: N/A;  . Tee without cardioversion N/A 08/17/2014    Procedure: TRANSESOPHAGEAL ECHOCARDIOGRAM (TEE);  Surgeon: Dorothy Spark, MD;  Location: [facility] ENDOSCOPY;  Service: Cardiovascular;  Laterality: N/A;  . Debridment of decubitus ulcer N/A 10/04/2014    Procedure: DEBRIDMENT OF DECUBITUS ULCER;  Surgeon: Georganna Skeans, MD;  Location: [place];  Service: General;  Laterality: N/A;  . Laparoscopic diverted colostomy N/A 10/12/2014    Procedure: LAPAROSCOPIC DIVERTING COLOSTOMY;  Surgeon: Donnie Mesa, MD;  Location: [place];  Service: General;  Laterality: N/A;  . Insertion of suprapubic catheter N/A 10/12/2014    Procedure: INSERTION OF SUPRAPUBIC CATHETER;  Surgeon: Reece Packer, MD;  Location: [place];  Service: Urology;  Laterality: N/A;  . Incision and drainage of wound N/A 11/12/2014    Procedure: IRRIGATION AND DEBRIDEMENT OF WOUNDS WITH BONE BIOPSY AND SURGICAL PREP ;  Surgeon: Theodoro Kos, DO;  Location: [place];  Service: Plastics;  Laterality: N/A;  . Application of a-cell of back N/A 11/12/2014    Procedure: APPLICATION A CELL AND VAC ;  Surgeon: Theodoro Kos, DO;  Location: [place];  Service: Plastics;  Laterality: N/A;  . Incision and drainage of wound N/A 11/18/2014    Procedure: IRRIGATION AND DEBRIDEMENT OF SACRAL ULCER ONLY WITH PLACEMENT OF A CELL AND VAC/ DRESSING CHANGE TO UPPER BACK AREA.;  Surgeon: Theodoro Kos, DO;  Location: [place];  Service: Plastics;  Laterality: N/A;  . Incision and drainage of wound N/A 11/25/2014    Procedure: IRRIGATION AND DEBRIDEMENT OF SACRAL ULCER AND BACK BURN WITH PLACEMENT OF A-CELL;  Surgeon: Theodoro Kos, DO;  Location: [place];  Service: Plastics;  Laterality: N/A;  . Minor application of wound vac N/A 11/25/2014    Procedure:  WOUND VAC CHANGE;  Surgeon: Theodoro Kos, DO;  Location: [place];  Service: Plastics;  Laterality: N/A;  . Incision and drainage of wound Right 12/08/2014    Procedure: IRRIGATION AND DEBRIDEMENT SACRAL WOUND AND RIGHT ISCHIAL WOUND ;  Surgeon: Theodoro Kos, DO;  Location: [place];  Service: Clinical cytogeneticist;  Laterality: Right;  . Application of a-cell of back N/A 12/08/2014    Procedure: APPLICATION OF A-CELL AND WOUND VAC ;  Surgeon: Theodoro Kos, DO;  Location: [place];  Service: Plastics;  Laterality: N/A;  . Tee without cardioversion N/A 05/05/2015    Procedure: TRANSESOPHAGEAL ECHOCARDIOGRAM (TEE);  Surgeon: Lelon Perla, MD;  Location: [place];  Service: Cardiovascular;  Laterality: N/A;  . Incision and drainage of wound Bilateral 05/05/2015    Procedure: IRRIGATION AND DEBRIDEMENT SACRAL ULCER;  Surgeon: Theodoro Kos, DO;  Location: [place];  Service: Plastics;  Laterality: Bilateral;  .  Hematoma evacuation N/A 05/05/2015    Procedure: EVACUATION HEMATOMA bedside procedure;  Surgeon: Theodoro Kos, DO;  Location: [place];  Service: Plastics;  Laterality: N/A;    Family History  Problem Relation Age of Onset  . Diabetes Father   . Hypertension Father   . Asthma      fhx  . Hypertension      fhx  . Stroke      fhx  . Heart disease Mother     Social History:  reports that he quit smoking about 10 months ago. His smoking use included Cigars and Cigarettes. He has a 12 pack-year smoking history. He has never used smokeless tobacco. He reports that he does not drink alcohol or use illicit drugs.  Allergies:  Allergies  Allergen Reactions  . Cefuroxime Axetil Anaphylaxis  . Ertapenem Other (See Comments)    Rash and confusion-->tolerated Imipenem   . Morphine And Related Other (See Comments)    Changed mental status, confusion, headache, visual hallucination  . Penicillins Anaphylaxis and Other (See Comments)    Tolerated Imipenem; no reaction to 7 day course of amoxicillin in 2015 Has patient had a PCN reaction causing immediate rash, facial/tongue/throat swelling, SOB or lightheadedness with hypotension: Yes Has patient had a PCN reaction causing severe rash involving mucus membranes or skin necrosis: No Has patient had a PCN reaction that required hospitalization Yes Has patient had a PCN reaction occurring within the last 10 years: No If all of the above answers are "NO", then may proceed with Cephalo  . Sulfa Antibiotics Anaphylaxis, Shortness Of Breath and Other (See Comments)  . Tessalon [Benzonatate] Anaphylaxis  . Shellfish Allergy Itching and Other (See Comments)    Took benadryl to alleviate reaction  . Nsaids Other (See Comments)    Risk of bleeding  . Miripirium Rash and Other (See Comments)    Change in mental status    Medications: I have reviewed the patient's current medications.  Results for orders placed or performed during the hospital  encounter of 07/10/15 (from the past 48 hour(s))  Glucose, capillary     Status: Abnormal   Collection Time: 07/26/15  7:52 PM  Result Value Ref Range   Glucose-Capillary 106 (H) 65 - 99 mg/dL   Comment 1 Notify RN   Vancomycin, trough     Status: None   Collection Time: 07/26/15 10:26 PM  Result Value Ref Range   Vancomycin Tr 14 10.0 - 20.0 ug/mL  Glucose, capillary     Status: None   Collection Time: 07/26/15 11:42 PM  Result Value Ref Range   Glucose-Capillary 66 65 - 99 mg/dL  Glucose, capillary     Status: None   Collection Time: 07/27/15 12:15 AM  Result Value Ref Range   Glucose-Capillary 66 65 - 99 mg/dL   Comment 1 Notify RN   CBC  Status: Abnormal   Collection Time: 07/27/15 12:55 AM  Result Value Ref Range   WBC 18.0 (H) 4.0 - 10.5 K/uL    Comment: WHITE COUNT CONFIRMED ON SMEAR   RBC 3.59 (L) 4.22 - 5.81 MIL/uL   Hemoglobin 10.0 (L) 13.0 - 17.0 g/dL   HCT 30.1 (L) 39.0 - 52.0 %   MCV 83.8 78.0 - 100.0 fL   MCH 27.9 26.0 - 34.0 pg   MCHC 33.2 30.0 - 36.0 g/dL   RDW 15.3 11.5 - 15.5 %   Platelets 572 (H) 150 - 400 K/uL    Comment: PLATELET COUNT CONFIRMED BY SMEAR  Basic metabolic panel     Status: Abnormal   Collection Time: 07/27/15 12:55 AM  Result Value Ref Range   Sodium 131 (L) 135 - 145 mmol/L   Potassium 4.4 3.5 - 5.1 mmol/L   Chloride 97 (L) 101 - 111 mmol/L   CO2 24 22 - 32 mmol/L   Glucose, Bld 198 (H) 65 - 99 mg/dL   BUN 17 6 - 20 mg/dL   Creatinine, Ser 0.83 0.61 - 1.24 mg/dL   Calcium 8.8 (L) 8.9 - 10.3 mg/dL   GFR calc non Af Amer >60 >60 mL/min   GFR calc Af Amer >60 >60 mL/min    Comment: (NOTE) The eGFR has been calculated using the CKD EPI equation. This calculation has not been validated in all clinical situations. eGFR's persistently <60 mL/min signify possible Chronic Kidney Disease.    Anion gap 10 5 - 15  Glucose, capillary     Status: Abnormal   Collection Time: 07/27/15 12:58 AM  Result Value Ref Range   Glucose-Capillary  197 (H) 65 - 99 mg/dL   Comment 1 Notify RN   Glucose, capillary     Status: Abnormal   Collection Time: 07/27/15  3:39 AM  Result Value Ref Range   Glucose-Capillary 199 (H) 65 - 99 mg/dL   Comment 1 Notify RN   Urinalysis, Routine w reflex microscopic (not at [facility])     Status: Abnormal   Collection Time: 07/27/15  6:45 AM  Result Value Ref Range   Color, Urine YELLOW YELLOW   APPearance TURBID (A) CLEAR   Specific Gravity, Urine 1.010 1.005 - 1.030   pH 6.0 5.0 - 8.0   Glucose, UA NEGATIVE NEGATIVE mg/dL   Hgb urine dipstick MODERATE (A) NEGATIVE   Bilirubin Urine NEGATIVE NEGATIVE   Ketones, ur NEGATIVE NEGATIVE mg/dL   Protein, ur NEGATIVE NEGATIVE mg/dL   Urobilinogen, UA 0.2 0.0 - 1.0 mg/dL   Nitrite POSITIVE (A) NEGATIVE   Leukocytes, UA LARGE (A) NEGATIVE  Urine microscopic-add on     Status: Abnormal   Collection Time: 07/27/15  6:45 AM  Result Value Ref Range   Squamous Epithelial / LPF RARE RARE   WBC, UA TOO NUMEROUS TO COUNT <3 WBC/hpf   RBC / HPF 0-2 <3 RBC/hpf   Bacteria, UA MANY (A) RARE   Urine-Other FEW YEAST   Culture, blood (routine x 2)     Status: None (Preliminary result)   Collection Time: 07/27/15  7:05 AM  Result Value Ref Range   Specimen Description BLOOD RIGHT HAND    Special Requests BOTTLES DRAWN AEROBIC ONLY 3CC    Culture NO GROWTH 1 DAY    Report Status PENDING   Culture, blood (routine x 2)     Status: None (Preliminary result)   Collection Time: 07/27/15  7:10 AM  Result Value Ref Range  Specimen Description BLOOD RIGHT ARM    Special Requests BOTTLES DRAWN AEROBIC ONLY 3CC    Culture NO GROWTH 1 DAY    Report Status PENDING   Glucose, capillary     Status: Abnormal   Collection Time: 07/27/15  7:39 AM  Result Value Ref Range   Glucose-Capillary 222 (H) 65 - 99 mg/dL  Glucose, capillary     Status: Abnormal   Collection Time: 07/27/15 12:33 PM  Result Value Ref Range   Glucose-Capillary 151 (H) 65 - 99 mg/dL  Glucose, capillary      Status: None   Collection Time: 07/27/15  5:44 PM  Result Value Ref Range   Glucose-Capillary 91 65 - 99 mg/dL  Glucose, capillary     Status: None   Collection Time: 07/27/15  7:57 PM  Result Value Ref Range   Glucose-Capillary 65 65 - 99 mg/dL   Comment 1 Notify RN   Glucose, capillary     Status: Abnormal   Collection Time: 07/27/15  8:16 PM  Result Value Ref Range   Glucose-Capillary 152 (H) 65 - 99 mg/dL  Glucose, capillary     Status: Abnormal   Collection Time: 07/28/15 12:01 AM  Result Value Ref Range   Glucose-Capillary 297 (H) 65 - 99 mg/dL  Glucose, capillary     Status: Abnormal   Collection Time: 07/28/15  3:45 AM  Result Value Ref Range   Glucose-Capillary 205 (H) 65 - 99 mg/dL   Comment 1 Notify RN   Glucose, capillary     Status: Abnormal   Collection Time: 07/28/15  7:53 AM  Result Value Ref Range   Glucose-Capillary 248 (H) 65 - 99 mg/dL  Glucose, capillary     Status: Abnormal   Collection Time: 07/28/15 11:23 AM  Result Value Ref Range   Glucose-Capillary 219 (H) 65 - 99 mg/dL  Glucose, capillary     Status: Abnormal   Collection Time: 07/28/15  3:18 PM  Result Value Ref Range   Glucose-Capillary 185 (H) 65 - 99 mg/dL  Glucose, capillary     Status: Abnormal   Collection Time: 07/28/15  3:56 PM  Result Value Ref Range   Glucose-Capillary 204 (H) 65 - 99 mg/dL    Dg Chest Port 1 View  07/27/2015  CLINICAL DATA:  Tracheostomy and fever EXAM: PORTABLE CHEST 1 VIEW COMPARISON:  07/16/2015 FINDINGS: Tracheostomy tube in unchanged position over the tracheal air column. Heart size and vascular pattern are normal. There are mild bilateral lower lobe infiltrates. When compared to the prior study there both significantly improved. No pleural effusion. Stable mild expansile deformity distal right clavicle possibly due to prior trauma. IMPRESSION: Persistent but improved bilateral lower lobe infiltrates. Electronically Signed   By: Skipper Cliche M.D.   On:  07/27/2015 08:09    Review of Systems  Constitutional: Positive for fever and malaise/fatigue.  HENT: Negative.   Eyes: Negative.   Respiratory: Negative.   Cardiovascular: Negative.   Gastrointestinal: Negative.   Genitourinary: Negative.   Musculoskeletal: Negative.   Psychiatric/Behavioral: Negative.    Blood pressure 102/60, pulse 95, temperature 98.3 F (36.8 C), temperature source Oral, resp. rate 16, height 5' 7" (1.702 m), weight 57.153 kg (126 lb), SpO2 100 %. Physical Exam  Constitutional: He is oriented to person, place, and time. He appears well-developed.  HENT:  Head: Normocephalic and atraumatic.  Eyes: Conjunctivae and EOM are normal. Pupils are equal, round, and reactive to light.  Cardiovascular: Normal rate.   Respiratory: Effort  normal.  Musculoskeletal:       Back:  Neurological: He is alert and oriented to person, place, and time.  Psychiatric: He has a normal mood and affect. His behavior is normal. Thought content normal.    Assessment/Plan: He is a good candidate for returning to the OR for debridement and exploration.  Will work on getting him on the OR schedule.  [facility] would be a very good option for nutrition improvement and off loading 24/7.  [name] Going 07/28/2015, 5:23 PM

## 2015-07-28 NOTE — Progress Notes (Signed)
[place] for Infectious Disease    Subjective:   Pt had copious sputum that was suctioned last night   Antibiotics:  Anti-infectives    Start     Dose/Rate Route Frequency Ordered Stop   07/27/15 1800  vancomycin (VANCOCIN) 500 mg in sodium chloride 0.9 % 100 mL IVPB     500 mg 100 mL/hr over 60 Minutes Intravenous Every 12 hours 07/27/15 0048     07/23/15 2300  vancomycin (VANCOCIN) IVPB 750 mg/150 ml premix  Status:  Discontinued     750 mg 150 mL/hr over 60 Minutes Intravenous Every 24 hours 07/23/15 2215 07/27/15 0048   07/18/15 0000  vancomycin (VANCOCIN) IVPB 750 mg/150 ml premix     750 mg 150 mL/hr over 60 Minutes Intravenous Every 24 hours 07/17/15 0508 07/19/15 0123   07/17/15 0100  vancomycin (VANCOCIN) 500 mg in sodium chloride 0.9 % 100 mL IVPB  Status:  Discontinued     500 mg 100 mL/hr over 60 Minutes Intravenous Every 24 hours 07/16/15 1016 07/17/15 0507   07/13/15 2000  imipenem-cilastatin (PRIMAXIN) 250 mg in sodium chloride 0.9 % 100 mL IVPB     250 mg 200 mL/hr over 30 Minutes Intravenous 3 times per day 07/13/15 1046 07/19/15 2256   07/11/15 2200  imipenem-cilastatin (PRIMAXIN) 250 mg in sodium chloride 0.9 % 100 mL IVPB  Status:  Discontinued     250 mg 200 mL/hr over 30 Minutes Intravenous Every 12 hours 07/11/15 1020 07/13/15 1046   07/10/15 2230  imipenem-cilastatin (PRIMAXIN) 250 mg in sodium chloride 0.9 % 100 mL IVPB  Status:  Discontinued     250 mg 200 mL/hr over 30 Minutes Intravenous 3 times per day 07/10/15 2226 07/11/15 1020   07/10/15 2230  vancomycin (VANCOCIN) 500 mg in sodium chloride 0.9 % 100 mL IVPB  Status:  Discontinued     500 mg 100 mL/hr over 60 Minutes Intravenous Every 24 hours 07/10/15 2226 07/16/15 1016      Medications: Scheduled Meds: . baclofen  10 mg Oral 2 times per day  . baclofen  20 mg Oral QHS  . chlorhexidine gluconate  15 mL Mouth Rinse BID  . collagenase   Topical Daily  .  enoxaparin (LOVENOX) injection  40 mg Subcutaneous Daily  . feeding supplement (VITAL AF 1.2 CAL)  1,000 mL Per Tube Q24H  . fentaNYL  50 mcg Transdermal Q72H  . Influenza vac split quadrivalent PF  0.5 mL Intramuscular Tomorrow-1000  . insulin aspart  0-15 Units Subcutaneous 6 times per day  . [START ON 07/29/2015] insulin glargine  20 Units Subcutaneous Daily  . midodrine  30 mg Oral TID WC  . multivitamin with minerals  1 tablet Oral Daily  . pantoprazole sodium  40 mg Oral Daily  . pregabalin  100 mg Oral 3 times per day  . pregabalin  200 mg Oral QHS  . vancomycin  500 mg Intravenous Q12H   Continuous Infusions: . sodium chloride 10 mL/hr at 07/22/15 1722   PRN Meds:.acetaminophen **OR** acetaminophen, albuterol, LORazepam, mirtazapine, ondansetron **OR** ondansetron (ZOFRAN) IV, oxyCODONE, RESOURCE THICKENUP CLEAR    Objective: Weight change: 6 lb 12.8 oz (3.084 kg)  Intake/Output Summary (Last 24 hours) at 07/28/15 1923 Last data filed at 07/28/15 1840  Gross per 24 hour  Intake   2755 ml  Output   3800 ml  Net  -1045 ml   Blood pressure 102/60,  pulse 95, temperature 98.3 F (36.8 C), temperature source Oral, resp. rate 16, height 5\' 7"  (1.702 m), weight 126 lb (57.153 kg), SpO2 100 %. Temp:  [98.1 F (36.7 C)-98.4 F (36.9 C)] 98.3 F (36.8 C) (10/20 1300) Pulse Rate:  [70-99] 95 (10/20 1709) Resp:  [16-17] 16 (10/20 1709) BP: (100-163)/(60-94) 102/60 mmHg (10/20 1300) SpO2:  [98 %-100 %] 100 % (10/20 1709) FiO2 (%):  [21 %] 21 % (10/20 1709) Weight:  [126 lb (57.153 kg)] 126 lb (57.153 kg) (10/20 0352)  Physical Exam: General: Alert and awake,   Skin:   07/27/15:      Neuro: no new deficits  CBC: CBC Latest Ref Rng 07/27/2015 07/25/2015 07/23/2015  WBC 4.0 - 10.5 K/uL 18.0(H) 19.9(H) 20.2(H)  Hemoglobin 13.0 - 17.0 g/dL 10.0(L) 9.3(L) 9.7(L)  Hematocrit 39.0 - 52.0 % 30.1(L) 30.2(L) 30.1(L)  Platelets 150 - 400 K/uL 572(H) 649(H) 418(H)        BMET  Recent Labs  07/27/15 0055  NA 131*  K 4.4  CL 97*  CO2 24  GLUCOSE 198*  BUN 17  CREATININE 0.83  CALCIUM 8.8*     Liver Panel  No results for input(s): PROT, ALBUMIN, AST, ALT, ALKPHOS, BILITOT, BILIDIR, IBILI in the last 72 hours.     Sedimentation Rate No results for input(s): ESRSEDRATE in the last 72 hours. C-Reactive Protein No results for input(s): CRP in the last 72 hours.  Micro Results: Recent Results (from the past 720 hour(s))  Blood culture (routine x 2)     Status: None   Collection Time: 07/10/15  4:25 PM  Result Value Ref Range Status   Specimen Description BLOOD LEFT ARM  Final   Special Requests BOTTLES DRAWN AEROBIC AND ANAEROBIC 5CC  Final   Culture NO GROWTH 5 DAYS  Final   Report Status 07/15/2015 FINAL  Final  MRSA PCR Screening     Status: None   Collection Time: 07/11/15  1:00 AM  Result Value Ref Range Status   MRSA by PCR NEGATIVE NEGATIVE Final    Comment:        The GeneXpert MRSA Assay (FDA approved for NASAL specimens only), is one component of a comprehensive MRSA colonization surveillance program. It is not intended to diagnose MRSA infection nor to guide or monitor treatment for MRSA infections.   Blood culture (routine x 2)     Status: None   Collection Time: 07/11/15  4:19 AM  Result Value Ref Range Status   Specimen Description BLOOD RIGHT FOREARM  Final   Special Requests BOTTLES DRAWN AEROBIC ONLY 2CC  Final   Culture NO GROWTH 5 DAYS  Final   Report Status 07/16/2015 FINAL  Final  Urine culture     Status: None   Collection Time: 07/11/15  5:09 PM  Result Value Ref Range Status   Specimen Description URINE, RANDOM  Final   Special Requests NONE  Final   Culture   Final    >=100,000 COLONIES/mL PSEUDOMONAS AERUGINOSA AMIKACIN MIC=8 SENSITIVE COLISTIN MIC=1 SENSITIVE >=100,000 COLONIES/mL YEAST    Report Status 07/27/2015 FINAL  Final   Organism ID, Bacteria PSEUDOMONAS AERUGINOSA  Final       Susceptibility   Pseudomonas aeruginosa - MIC*    CEFTAZIDIME 8 SENSITIVE Sensitive     CIPROFLOXACIN >=4 RESISTANT Resistant     GENTAMICIN >=16 RESISTANT Resistant     IMIPENEM >=16 RESISTANT Resistant     CEFEPIME 16 INTERMEDIATE Intermediate     AZTREONAM  Value in next row       >=16RESISTANT    * >=100,000 COLONIES/mL PSEUDOMONAS AERUGINOSA  Culture, blood (routine x 2)     Status: None   Collection Time: 07/23/15 10:35 PM  Result Value Ref Range Status   Specimen Description BLOOD RIGHT ARM  Final   Special Requests IN PEDIATRIC BOTTLE 3CC  Final   Culture NO GROWTH 5 DAYS  Final   Report Status 07/28/2015 FINAL  Final  Culture, blood (routine x 2)     Status: None   Collection Time: 07/23/15 10:45 PM  Result Value Ref Range Status   Specimen Description BLOOD RIGHT ANTECUBITAL  Final   Special Requests BOTTLES DRAWN AEROBIC AND ANAEROBIC 5CC   Final   Culture NO GROWTH 5 DAYS  Final   Report Status 07/28/2015 FINAL  Final  Culture, blood (routine x 2)     Status: None (Preliminary result)   Collection Time: 07/27/15  7:05 AM  Result Value Ref Range Status   Specimen Description BLOOD RIGHT HAND  Final   Special Requests BOTTLES DRAWN AEROBIC ONLY 3CC  Final   Culture NO GROWTH 1 DAY  Final   Report Status PENDING  Incomplete  Culture, blood (routine x 2)     Status: None (Preliminary result)   Collection Time: 07/27/15  7:10 AM  Result Value Ref Range Status   Specimen Description BLOOD RIGHT ARM  Final   Special Requests BOTTLES DRAWN AEROBIC ONLY 3CC  Final   Culture NO GROWTH 1 DAY  Final   Report Status PENDING  Incomplete    Studies/Results: Dg Chest Port 1 View  07/27/2015  CLINICAL DATA:  Tracheostomy and fever EXAM: PORTABLE CHEST 1 VIEW COMPARISON:  07/16/2015 FINDINGS: Tracheostomy tube in unchanged position over the tracheal air column. Heart size and vascular pattern are normal. There are mild bilateral lower lobe infiltrates. When compared to the  prior study there both significantly improved. No pleural effusion. Stable mild expansile deformity distal right clavicle possibly due to prior trauma. IMPRESSION: Persistent but improved bilateral lower lobe infiltrates. Electronically Signed   By: Skipper Cliche M.D.   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 07/27/15: copious secretions suctioned  Principal Problem:   Acute encephalopathy Active Problems:   Palliative care encounter   Chronic anemia   Uncontrolled type 1 diabetes mellitus with hyperglycemia ([name])   Altered mental status   Tracheostomy dependent ([name])   H/O spinal cord injury   Chronic paraplegia ([name])   Acute on chronic respiratory failure ([name])   Hypomagnesemia   Decubitus ulcer of right perineal ischial region, stage 4 ([name])   Decubitus ulcer of left perineal ischial region, stage 4 ([name])   Decubitus ulcer of sacral region, stage 4 ([name])   Complicated urinary tract infection   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 Pt stated his urine had  begun "looking dirty" but he only got fosfomycin that was active vs this organism, he now feels much better. WOC stated that his wounds were looking much improved at the bedside. His CT without contrast was unrevealing CT WITH  contrast and this showed an area   Sacral decubitus ulcer with prior distal sacrococcygeal destruction or resection and foci of gas within the LEFT gluteus maximus question infection.  Looks to be NEW and due to infection progressing  Greatly appreciate  Dr. Eusebio Friendly help to take him to the OR for debridement of  this area and it will help [name] in ID if we have [place], TISSUE to help guide therapy esp give his hx of MDRO  I agree that he would be well served by being in [facility] or SNF but I am confident his mother will not agree with it.        LOS: 18 days   [name] 07/28/2015, 7:23 PM

## 2015-07-28 NOTE — Progress Notes (Signed)
PROGRESS NOTE    Jason [name] TGY:[id number] DOB: Oct 27, 1985 DOA: 07/10/2015 PCP: [name], MD  HPI/Brief narrative 29 y.o. 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 Patient was recently tapered off his IV antibiotics and was placed on doxycycline and Cipro 3 days prior to this admit for possible UTI.   In the ER patient UA shows features concerning for UTI - patient has chronic suprapubic catheter. Patient was also found to be hypothermic and hypotensive. Patient has had previous history of multiple debridement for chronic sacral decubitus which at this time looks to be healing. Patient also has history of chronic osteomyelitis.    Assessment/Plan:  Toxic metabolic encephalopathy Resolved - patient is presently alert and oriented  Sepsis due to MDR Pseudomonas Aeruginosa UTI ?  Vs decubitus Status post third dose of fosfomycin 10/10 per ID recommendations  - suprapubic catheter changed 10/14?  - ID follow-up appreciated: CT Scan showed: Sacral decubitus ulcer with prior distal sacrococcygeal destruction or resection and foci of gas within the LEFT gluteus maximus question infection. Discussed with Dr. Cristela Blue and Dr. Migdalia Dk. She is unable to get patient on the schedule until Thursday of next week. Disposition per ID.   Acute renal failure Due to sepsis - renal function has normalized  Hyponatremia  Mild, monitor  Hypomagnesemia  Replaced to goal of 2.0  Diabetes mellitus type 1 with hyperglycemia CBGs are peaking at night when tube feeding is begun and then improving during the day when patient is on oral diet - -increase lantus to 20  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 cont hydrotherapy  to wounds as per general surgery recommendations - antibiotic reinitiated due to concern for sepsis. ID following. Wound care consultation. -plastic consult  Chronic anemia Hgb initially dropped w/ volume expansion - s/p 2U PRBC - no evidence of acute blood loss - hemoglobin appears stable at this time   Chronic respiratory failure with trach and vent pulmonary critical care supposed to be managing vent - it appears the patient took himself off the vent 10/7 and has not been back on since - currently appears stable on 28% trach collar  - As per PCCM ELink note 10/3, it appears that patient is followed in OP trach clinic> will defer further trach decisions to OP follow up in that clinic. - Discussed with CCM M.D. on call 10/16: Agrees with outpatient follow-up with trach clinic and indicates that he may not be decannulated.   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 IVC filter placed 8/6  History of spinal canal injury with paraplegia  Severe malnutrition in context of chronic illness, Underweight patient now able to eat and cleared for regular diet - transitioned to nocturnal tube feeding regimen - decrease tube feed rate QHS as discussed above  DVT Prophylaxis: Lovenox Code Status: Full Family Communication: no family at bedside Disposition Plan: DC home when stable   Consultants:  PCCM   Infectious disease  Plastics- [facility]  Procedures:  None  Antibiotics:  Imipenem 10/2 > 10/11  Vanc 10/2 > 10/11  Fosfomycin 10/6 > 10/10  Vancomycin 10/15 >   Subjective: No complaints  Objective: Filed Vitals:   07/28/15 0900 07/28/15 0945 07/28/15 1117 07/28/15 1300  BP:    102/60  Pulse:  99  94  Temp:    98.3 F (36.8 C)  TempSrc:    Oral  Resp:  16  17  Height:      Weight:      SpO2: 99% 99% 99% 100%    Intake/Output Summary (Last 24 hours) at 07/28/15 1529 Last data filed at 07/28/15 1416  Gross per 24 hour  Intake   2015 ml  Output   4600 ml  Net  -2585 ml   Filed  Weights   07/26/15 0700 07/27/15 0555 07/28/15 0352  Weight: 53.41 kg (117 lb 12 oz) 54.069 kg (119 lb 3.2 oz) 57.153 kg (126 lb)     Exam:  General exam: Comfortable. Nontoxic appearing. Eating lunch. Respiratory system: Clear. No increased work of breathing. Cardiovascular system: S1 & S2 heard, RRR. No JVD, murmurs, gallops, clicks or pedal edema. Gastrointestinal system: Abdomen is nondistended, soft and nontender. Normal bowel sounds heard.- ostomy functioning Extremities: Edema noted  Data Reviewed: Basic Metabolic Panel:  Recent Labs Lab 07/23/15 0507 07/25/15 0312 07/27/15 0055  NA 129* 130* 131*  K 4.3 4.7 4.4  CL 99* 97* 97*  CO2 23 26 24   GLUCOSE 229* 221* 198*  BUN 13 12 17   CREATININE 0.54* 0.53* 0.83  CALCIUM 8.3* 8.4* 8.8*   Liver Function Tests:  Recent Labs Lab 07/25/15 0312  AST 60*  ALT 53  ALKPHOS 135*  BILITOT 0.4  PROT 6.6  ALBUMIN 1.8*   No results for input(s): LIPASE, AMYLASE in the last 168 hours. No results for input(s): AMMONIA in the last 168 hours. CBC:  Recent Labs Lab 07/23/15 0507 07/25/15 0312 07/27/15 0055  WBC 20.2* 19.9* 18.0*  HGB 9.7* 9.3* 10.0*  HCT 30.1* 30.2* 30.1*  MCV 86.2 86.3 83.8  PLT 418* 649* 572*   Cardiac Enzymes: No results for input(s): CKTOTAL, CKMB, CKMBINDEX, TROPONINI in the last 168 hours. BNP (last 3 results) No results for input(s): PROBNP in the last 8760 hours. CBG:  Recent Labs Lab 07/27/15 2016 07/28/15 0001 07/28/15 0345 07/28/15 0753 07/28/15 1123  GLUCAP 152* 297* 205* 248* 219*    Recent Results (from the past 240 hour(s))  Culture, blood (routine x 2)     Status: None   Collection Time: 07/23/15 10:35 PM  Result Value Ref Range Status   Specimen Description BLOOD RIGHT ARM  Final   Special Requests IN PEDIATRIC BOTTLE 3CC  Final   Culture NO GROWTH 5 DAYS  Final   Report Status 07/28/2015 FINAL  Final  Culture, blood (routine x 2)     Status: None   Collection Time:  07/23/15 10:45 PM  Result Value Ref Range Status   Specimen Description BLOOD RIGHT ANTECUBITAL  Final   Special Requests BOTTLES DRAWN AEROBIC AND ANAEROBIC 5CC   Final   Culture NO GROWTH 5 DAYS  Final   Report Status 07/28/2015 FINAL  Final  Culture, blood (routine x 2)     Status: None (Preliminary result)   Collection Time: 07/27/15  7:05 AM  Result Value Ref Range Status   Specimen Description BLOOD RIGHT HAND  Final   Special Requests BOTTLES DRAWN AEROBIC ONLY 3CC  Final   Culture NO GROWTH 1 DAY  Final   Report Status PENDING  Incomplete  Culture, blood (routine x 2)     Status: None (Preliminary result)   Collection Time: 07/27/15  7:10 AM  Result Value Ref Range Status   Specimen Description BLOOD RIGHT ARM  Final   Special  Requests BOTTLES DRAWN AEROBIC ONLY 3CC  Final   Culture NO GROWTH 1 DAY  Final   Report Status PENDING  Incomplete           Studies: Dg Chest Port 1 View  07/27/2015  CLINICAL DATA:  Tracheostomy and fever EXAM: PORTABLE CHEST 1 VIEW COMPARISON:  07/16/2015 FINDINGS: Tracheostomy tube in unchanged position over the tracheal air column. Heart size and vascular pattern are normal. There are mild bilateral lower lobe infiltrates. When compared to the prior study there both significantly improved. No pleural effusion. Stable mild expansile deformity distal right clavicle possibly due to prior trauma. IMPRESSION: Persistent but improved bilateral lower lobe infiltrates. Electronically Signed   By: Skipper Cliche M.D.   On: 07/27/2015 08:09        Scheduled Meds: . baclofen  10 mg Oral 2 times per day  . baclofen  20 mg Oral QHS  . chlorhexidine gluconate  15 mL Mouth Rinse BID  . collagenase   Topical Daily  . enoxaparin (LOVENOX) injection  40 mg Subcutaneous Daily  . feeding supplement (VITAL AF 1.2 CAL)  1,000 mL Per Tube Q24H  . fentaNYL  50 mcg Transdermal Q72H  . Influenza vac split quadrivalent PF  0.5 mL Intramuscular Tomorrow-1000   . insulin aspart  0-15 Units Subcutaneous 6 times per day  . insulin glargine  16 Units Subcutaneous Daily  . midodrine  30 mg Oral TID WC  . multivitamin with minerals  1 tablet Oral Daily  . pantoprazole sodium  40 mg Oral Daily  . pregabalin  100 mg Oral 3 times per day  . pregabalin  200 mg Oral QHS  . vancomycin  500 mg Intravenous Q12H   Continuous Infusions: . 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    Delfina Redwood, MD [facility]  [email or web address] Password [email or web address] 07/28/2015, 3:29 PM    LOS: 18 days

## 2015-07-29 LAB — BASIC METABOLIC PANEL
ANION GAP: 10 (ref 5–15)
BUN: 21 mg/dL — AB (ref 6–20)
CO2: 21 mmol/L — AB (ref 22–32)
Calcium: 8.5 mg/dL — ABNORMAL LOW (ref 8.9–10.3)
Chloride: 98 mmol/L — ABNORMAL LOW (ref 101–111)
Creatinine, Ser: 0.87 mg/dL (ref 0.61–1.24)
GFR calc Af Amer: >60 mL/min (ref >60)
GFR calc non Af Amer: >60 mL/min (ref >60)
GLUCOSE: 165 mg/dL — AB (ref 65–99)
POTASSIUM: 4.4 mmol/L (ref 3.5–5.1)
Sodium: 129 mmol/L — ABNORMAL LOW (ref 135–145)

## 2015-07-29 LAB — CBC
HEMATOCRIT: 30.3 % — AB (ref 39.0–52.0)
HEMOGLOBIN: 9.2 g/dL — AB (ref 13.0–17.0)
MCH: 26.6 pg (ref 26.0–34.0)
MCHC: 30.4 g/dL (ref 30.0–36.0)
MCV: 87.6 fL (ref 78.0–100.0)
Platelets: 819 10*3/uL — ABNORMAL HIGH (ref 150–400)
RBC: 3.46 MIL/uL — ABNORMAL LOW (ref 4.22–5.81)
RDW: 15.6 % — AB (ref 11.5–15.5)
WBC: 17.7 10*3/uL — ABNORMAL HIGH (ref 4.0–10.5)

## 2015-07-29 LAB — GLUCOSE, CAPILLARY
GLUCOSE-CAPILLARY: 163 mg/dL — AB (ref 65–99)
GLUCOSE-CAPILLARY: 131 mg/dL — AB (ref 65–99)
Glucose-Capillary: 146 mg/dL — ABNORMAL HIGH (ref 65–99)
Glucose-Capillary: 154 mg/dL — ABNORMAL HIGH (ref 65–99)
Glucose-Capillary: 174 mg/dL — ABNORMAL HIGH (ref 65–99)
Glucose-Capillary: 216 mg/dL — ABNORMAL HIGH (ref 65–99)

## 2015-07-29 MED ORDER — FOSFOMYCIN TROMETHAMINE 3 G PO PACK
3.0000 g | PACK | Freq: Once | ORAL | Status: AC
  Administered 2015-07-29: 3 g via ORAL
  Filled 2015-07-29: qty 3

## 2015-07-29 NOTE — Progress Notes (Signed)
[facility] for Infectious [facility]    Subjective:   Patient still having fevers every night   Antibiotics:  Anti-infectives    Start     Dose/Rate Route Frequency Ordered Stop   07/27/15 1800  vancomycin (VANCOCIN) 500 mg in sodium chloride 0.9 % 100 mL IVPB     500 mg 100 mL/hr over 60 Minutes Intravenous Every 12 hours 07/27/15 0048 07/30/15 0559   07/23/15 2300  vancomycin (VANCOCIN) IVPB 750 mg/150 ml premix  Status:  Discontinued     750 mg 150 mL/hr over 60 Minutes Intravenous Every 24 hours 07/23/15 2215 07/27/15 0048   07/18/15 0000  vancomycin (VANCOCIN) IVPB 750 mg/150 ml premix     750 mg 150 mL/hr over 60 Minutes Intravenous Every 24 hours 07/17/15 0508 07/19/15 0123   07/17/15 0100  vancomycin (VANCOCIN) 500 mg in sodium chloride 0.9 % 100 mL IVPB  Status:  Discontinued     500 mg 100 mL/hr over 60 Minutes Intravenous Every 24 hours 07/16/15 1016 07/17/15 0507   07/13/15 2000  imipenem-cilastatin (PRIMAXIN) 250 mg in sodium chloride 0.9 % 100 mL IVPB     250 mg 200 mL/hr over 30 Minutes Intravenous 3 times per day 07/13/15 1046 07/19/15 2256   07/11/15 2200  imipenem-cilastatin (PRIMAXIN) 250 mg in sodium chloride 0.9 % 100 mL IVPB  Status:  Discontinued     250 mg 200 mL/hr over 30 Minutes Intravenous Every 12 hours 07/11/15 1020 07/13/15 1046   07/10/15 2230  imipenem-cilastatin (PRIMAXIN) 250 mg in sodium chloride 0.9 % 100 mL IVPB  Status:  Discontinued     250 mg 200 mL/hr over 30 Minutes Intravenous 3 times per day 07/10/15 2226 07/11/15 1020   07/10/15 2230  vancomycin (VANCOCIN) 500 mg in sodium chloride 0.9 % 100 mL IVPB  Status:  Discontinued     500 mg 100 mL/hr over 60 Minutes Intravenous Every 24 hours 07/10/15 2226 07/16/15 1016      Medications: Scheduled Meds: . baclofen  10 mg Oral 2 times per day  . baclofen  20 mg Oral QHS  . chlorhexidine gluconate  15 mL Mouth Rinse BID  . collagenase   Topical Daily  .  enoxaparin (LOVENOX) injection  40 mg Subcutaneous Daily  . feeding supplement (VITAL AF 1.2 CAL)  1,000 mL Per Tube Q24H  . fentaNYL  50 mcg Transdermal Q72H  . Influenza vac split quadrivalent PF  0.5 mL Intramuscular Tomorrow-1000  . insulin aspart  0-15 Units Subcutaneous 6 times per day  . insulin glargine  20 Units Subcutaneous Daily  . midodrine  30 mg Oral TID WC  . multivitamin with minerals  1 tablet Oral Daily  . pantoprazole sodium  40 mg Oral Daily  . pregabalin  100 mg Oral 3 times per day  . pregabalin  200 mg Oral QHS  . vancomycin  500 mg Intravenous Q12H   Continuous Infusions: . sodium chloride 10 mL/hr at 07/22/15 1722   PRN Meds:.acetaminophen **OR** acetaminophen, albuterol, LORazepam, mirtazapine, ondansetron **OR** ondansetron (ZOFRAN) IV, oxyCODONE, RESOURCE THICKENUP CLEAR    Objective: Weight change: -14 lb 10.7 oz (-6.653 kg)  Intake/Output Summary (Last 24 hours) at 07/29/15 1713 Last data filed at 07/29/15 1358  Gross per 24 hour  Intake   4527 ml  Output    450 ml  Net   4077 ml   Blood pressure 131/91, pulse 85, temperature 98  F (36.7 C), temperature source Axillary, resp. rate 16, height 5\' 7"  (1.702 m), weight 111 lb 5.3 oz (50.5 kg), SpO2 97 %. Temp:  [98 F (36.7 C)-102.4 F (39.1 C)] 98 F (36.7 C) (10/21 1356) Pulse Rate:  [81-90] 85 (10/21 1605) Resp:  [16-20] 16 (10/21 1605) BP: (102-131)/(63-91) 131/91 mmHg (10/21 1356) SpO2:  [97 %-100 %] 97 % (10/21 1605) FiO2 (%):  [21 %] 21 % (10/21 1605) Weight:  [111 lb 5.3 oz (50.5 kg)] 111 lb 5.3 oz (50.5 kg) (10/21 3710)  Physical Exam: General: Alert and awake, oriented  He's not in respiratory distress he is having his skin cleaned Skin:   07/27/15:      Neuro: no new deficits  CBC: CBC Latest Ref Rng 07/29/2015 07/27/2015 07/25/2015  WBC 4.0 - 10.5 K/uL 17.7(H) 18.0(H) 19.9(H)  Hemoglobin 13.0 - 17.0 g/dL 9.2(L) 10.0(L) 9.3(L)  Hematocrit 39.0 - 52.0 % 30.3(L) 30.1(L)  30.2(L)  Platelets 150 - 400 K/uL 819(H) 572(H) 649(H)       BMET  Recent Labs  07/27/15 0055 07/29/15 0945  NA 131* 129*  K 4.4 4.4  CL 97* 98*  CO2 24 21*  GLUCOSE 198* 165*  BUN 17 21*  CREATININE 0.83 0.87  CALCIUM 8.8* 8.5*     Liver Panel  No results for input(s): PROT, ALBUMIN, AST, ALT, ALKPHOS, BILITOT, BILIDIR, IBILI in the last 72 hours.     Sedimentation Rate No results for input(s): ESRSEDRATE in the last 72 hours. C-Reactive Protein No results for input(s): CRP in the last 72 hours.  Micro Results: Recent Results (from the past 720 hour(s))  Blood culture (routine x 2)     Status: None   Collection Time: 07/10/15  4:25 PM  Result Value Ref Range Status   Specimen Description BLOOD LEFT ARM  Final   Special Requests BOTTLES DRAWN AEROBIC AND ANAEROBIC 5CC  Final   Culture NO GROWTH 5 DAYS  Final   Report Status 07/15/2015 FINAL  Final  MRSA PCR Screening     Status: None   Collection Time: 07/11/15  1:00 AM  Result Value Ref Range Status   MRSA by PCR NEGATIVE NEGATIVE Final    Comment:        The GeneXpert MRSA Assay (FDA approved for NASAL specimens only), is one component of a comprehensive MRSA colonization surveillance program. It is not intended to diagnose MRSA infection nor to guide or monitor treatment for MRSA infections.   Blood culture (routine x 2)     Status: None   Collection Time: 07/11/15  4:19 AM  Result Value Ref Range Status   Specimen Description BLOOD RIGHT FOREARM  Final   Special Requests BOTTLES DRAWN AEROBIC ONLY 2CC  Final   Culture NO GROWTH 5 DAYS  Final   Report Status 07/16/2015 FINAL  Final  Urine culture     Status: None   Collection Time: 07/11/15  5:09 PM  Result Value Ref Range Status   Specimen Description URINE, RANDOM  Final   Special Requests NONE  Final   Culture   Final    >=100,000 COLONIES/mL PSEUDOMONAS AERUGINOSA AMIKACIN MIC=8 SENSITIVE COLISTIN MIC=1 SENSITIVE >=100,000 COLONIES/mL  YEAST    Report Status 07/27/2015 FINAL  Final   Organism ID, Bacteria PSEUDOMONAS AERUGINOSA  Final      Susceptibility   Pseudomonas aeruginosa - MIC*    CEFTAZIDIME 8 SENSITIVE Sensitive     CIPROFLOXACIN >=4 RESISTANT Resistant     GENTAMICIN >=16  RESISTANT Resistant     IMIPENEM >=16 RESISTANT Resistant     CEFEPIME 16 INTERMEDIATE Intermediate     AZTREONAM Value in next row       >=16RESISTANT    * >=100,000 COLONIES/mL PSEUDOMONAS AERUGINOSA  Culture, blood (routine x 2)     Status: None   Collection Time: 07/23/15 10:35 PM  Result Value Ref Range Status   Specimen Description BLOOD RIGHT ARM  Final   Special Requests IN PEDIATRIC BOTTLE 3CC  Final   Culture NO GROWTH 5 DAYS  Final   Report Status 07/28/2015 FINAL  Final  Culture, blood (routine x 2)     Status: None   Collection Time: 07/23/15 10:45 PM  Result Value Ref Range Status   Specimen Description BLOOD RIGHT ANTECUBITAL  Final   Special Requests BOTTLES DRAWN AEROBIC AND ANAEROBIC 5CC   Final   Culture NO GROWTH 5 DAYS  Final   Report Status 07/28/2015 FINAL  Final  Culture, blood (routine x 2)     Status: None (Preliminary result)   Collection Time: 07/27/15  7:05 AM  Result Value Ref Range Status   Specimen Description BLOOD RIGHT HAND  Final   Special Requests BOTTLES DRAWN AEROBIC ONLY 3CC  Final   Culture NO GROWTH 1 DAY  Final   Report Status PENDING  Incomplete  Culture, blood (routine x 2)     Status: None (Preliminary result)   Collection Time: 07/27/15  7:10 AM  Result Value Ref Range Status   Specimen Description BLOOD RIGHT ARM  Final   Special Requests BOTTLES DRAWN AEROBIC ONLY 3CC  Final   Culture NO GROWTH 1 DAY  Final   Report Status PENDING  Incomplete    Studies/Results: No results found.    Assessment/Plan:  INTERVAL HISTORY:   10/17/16Patients wounds with improved appearance per [facility]  07/26/15" CT scan concerning for worsening osteomyelitis in pelvis 07/27/15: copious  secretions suctioned 07/29/15: Plastic Surgery plans surgery next Thursday  Principal Problem:   Acute encephalopathy Active Problems:   Palliative care encounter   Chronic anemia   Uncontrolled type 1 diabetes mellitus with hyperglycemia ([name])   Altered mental status   Tracheostomy dependent ([name])   H/O spinal cord injury   Chronic paraplegia ([name])   Acute on chronic respiratory failure ([name])   Hypomagnesemia   Decubitus ulcer of right perineal ischial region, stage 4 ([name])   Decubitus ulcer of left perineal ischial region, stage 4 ([name])   Decubitus ulcer of sacral region, stage 4 ([name])   Complicated urinary tract infection   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 Pt stated his urine had begun "looking dirty" but he only got fosfomycin that was active vs this organism, he now feels much better. WOC stated that his wounds were looking much improved at the bedside. His CT without contrast was unrevealing CT WITH contrast and this showed an area   Sacral decubitus ulcer with prior distal sacrococcygeal destruction or resection and foci of gas within the  LEFT gluteus maximus question infection.  Looks to be NEW and due to infection progressing  Greatly appreciate  Dr. Eusebio Friendly help to take him to the OR for debridement of  this area and it will help [name] in ID if we have [place], TISSUE to help guide therapy esp give  his hx of MDRO  He is not candidate for [facility]. His urine at the bedside is very opaque and "dirty" appearing  I will order urine culture and then try to see if a few doses of oral fosfomycin (which would not effect his bone cultures next week) will help with this        LOS: 19 days   [name] 07/29/2015, 5:13 PM

## 2015-07-29 NOTE — Care Management (Signed)
Referral for LTAC   Patient has Medicaid , confirmed with [facility] ( [place] ) and [facility] ( [place] ) , Medicaid does not have a LTAC benefit .  Magdalen Spatz RN BSN [phone]

## 2015-07-29 NOTE — Progress Notes (Signed)
PROGRESS NOTE    Jason [name] KXF:[id number] DOB: 24-Mar-1986 DOA: 07/10/2015 PCP: [name], MD  HPI/Brief narrative 29 y.o. 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 Patient was recently tapered off his IV antibiotics and was placed on doxycycline and Cipro 3 days prior to this admit for possible UTI.   In the ER patient UA shows features concerning for UTI - patient has chronic suprapubic catheter. Patient was also found to be hypothermic and hypotensive. Patient has had previous history of multiple debridement for chronic sacral decubitus which at this time looks to be healing. Patient also has history of chronic osteomyelitis.    Assessment/Plan:  Toxic metabolic encephalopathy Resolved - patient is presently alert and oriented  Sepsis due to MDR Pseudomonas Aeruginosa UTI ?  Vs decubitus/osteomyelitis Status post third dose of fosfomycin 10/10 per ID recommendations  - suprapubic catheter changed 10/14. D/w Dr. Sherral Hammers who confirms - ID follow-up appreciated: CT Scan showed: Sacral decubitus ulcer with prior distal sacrococcygeal destruction or resection and foci of gas within the LEFT gluteus maximus question infection. Discussed with Dr. Cristela Blue and Dr. Migdalia Dk. She is unable to get patient on the schedule until Thursday of next week. Disposition per ID. Still spiking fevers. Will consult CM to look into LTAC  Acute renal failure Due to sepsis - renal function has normalized  Hyponatremia  Mild, monitor  Hypomagnesemia  Replaced to goal of 2.0  Diabetes mellitus type 1 with hyperglycemia Improved on higher lantus dose -increase lantus to 20  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 Plastics/ID  consulted  Chronic anemia Hgb initially dropped w/ volume expansion - s/p 2U PRBC - no evidence of acute blood loss - hemoglobin appears stable at this time   Chronic respiratory failure with trach and vent pulmonary critical care supposed to be managing vent - it appears the patient took himself off the vent 10/7 and has not been back on since - currently appears stable on 28% trach collar  - As per PCCM ELink note 10/3, it appears that patient is followed in OP trach clinic> will defer further trach decisions to OP follow up in that clinic. - Discussed with CCM M.D. on call 10/16: Agrees with outpatient follow-up with trach clinic and indicates that he may not be decannulated.   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 IVC filter placed 8/6  History of spinal canal injury with paraplegia  Severe malnutrition in context of chronic illness, Underweight On night time TF  DVT Prophylaxis: Lovenox Code Status: Full Family Communication: no family at bedside Disposition Plan: LTAC?   Consultants:  PCCM   Infectious disease  Plastics- [facility]  Procedures:  None  Antibiotics:  Imipenem 10/2 > 10/11  Vanc 10/2 > 10/11  Fosfomycin 10/6 > 10/10  Vancomycin 10/15 >   Subjective: No complaints  Objective: Filed Vitals:   07/29/15 0629 07/29/15 0700 07/29/15 0905 07/29/15 1148  BP: 103/63     Pulse: 90 84  83  Temp: 102.4 F (39.1 C)  98.2 F (36.8 C)   TempSrc: Oral  Axillary   Resp: 18 16  16   Height:      Weight: 50.5 kg (111 lb 5.3 oz)     SpO2: 98% 98%  98%  Intake/Output Summary (Last 24 hours) at 07/29/15 1344 Last data filed at 07/29/15 1300  Gross per 24 hour  Intake   4037 ml  Output   1650 ml  Net   2387 ml   Filed Weights   07/27/15 0555 07/28/15 0352 07/29/15 0629  Weight: 54.069 kg (119 lb 3.2 oz) 57.153 kg (126 lb) 50.5 kg (111 lb 5.3 oz)     Exam:  General exam: Comfortable. Nontoxic appearing. Watching TV Respiratory system: Clear. No increased  work of breathing. Cardiovascular system: S1 & S2 heard, RRR. No JVD, murmurs, gallops, clicks or pedal edema. Gastrointestinal system: Abdomen is nondistended, soft and nontender. Normal bowel sounds heard.- ostomy functioning Extremities: Edema noted  Data Reviewed: Basic Metabolic Panel:  Recent Labs Lab 07/23/15 0507 07/25/15 0312 07/27/15 0055 07/29/15 0945  NA 129* 130* 131* 129*  K 4.3 4.7 4.4 4.4  CL 99* 97* 97* 98*  CO2 23 26 24  21*  GLUCOSE 229* 221* 198* 165*  BUN 13 12 17  21*  CREATININE 0.54* 0.53* 0.83 0.87  CALCIUM 8.3* 8.4* 8.8* 8.5*   Liver Function Tests:  Recent Labs Lab 07/25/15 0312  AST 60*  ALT 53  ALKPHOS 135*  BILITOT 0.4  PROT 6.6  ALBUMIN 1.8*   No results for input(s): LIPASE, AMYLASE in the last 168 hours. No results for input(s): AMMONIA in the last 168 hours. CBC:  Recent Labs Lab 07/23/15 0507 07/25/15 0312 07/27/15 0055 07/29/15 0945  WBC 20.2* 19.9* 18.0* 17.7*  HGB 9.7* 9.3* 10.0* 9.2*  HCT 30.1* 30.2* 30.1* 30.3*  MCV 86.2 86.3 83.8 87.6  PLT 418* 649* 572* 819*   Cardiac Enzymes: No results for input(s): CKTOTAL, CKMB, CKMBINDEX, TROPONINI in the last 168 hours. BNP (last 3 results) No results for input(s): PROBNP in the last 8760 hours. CBG:  Recent Labs Lab 07/28/15 1953 07/29/15 0002 07/29/15 0414 07/29/15 0716 07/29/15 1131  GLUCAP 193* 146* 216* 154* 174*    Recent Results (from the past 240 hour(s))  Culture, blood (routine x 2)     Status: None   Collection Time: 07/23/15 10:35 PM  Result Value Ref Range Status   Specimen Description BLOOD RIGHT ARM  Final   Special Requests IN PEDIATRIC BOTTLE 3CC  Final   Culture NO GROWTH 5 DAYS  Final   Report Status 07/28/2015 FINAL  Final  Culture, blood (routine x 2)     Status: None   Collection Time: 07/23/15 10:45 PM  Result Value Ref Range Status   Specimen Description BLOOD RIGHT ANTECUBITAL  Final   Special Requests BOTTLES DRAWN AEROBIC AND  ANAEROBIC 5CC   Final   Culture NO GROWTH 5 DAYS  Final   Report Status 07/28/2015 FINAL  Final  Culture, blood (routine x 2)     Status: None (Preliminary result)   Collection Time: 07/27/15  7:05 AM  Result Value Ref Range Status   Specimen Description BLOOD RIGHT HAND  Final   Special Requests BOTTLES DRAWN AEROBIC ONLY 3CC  Final   Culture NO GROWTH 1 DAY  Final   Report Status PENDING  Incomplete  Culture, blood (routine x 2)     Status: None (Preliminary result)   Collection Time: 07/27/15  7:10 AM  Result Value Ref Range Status   Specimen Description BLOOD RIGHT ARM  Final   Special Requests BOTTLES DRAWN AEROBIC ONLY 3CC  Final   Culture NO GROWTH 1 DAY  Final   Report Status PENDING  Incomplete  Studies: No results found.      Scheduled Meds: . baclofen  10 mg Oral 2 times per day  . baclofen  20 mg Oral QHS  . chlorhexidine gluconate  15 mL Mouth Rinse BID  . collagenase   Topical Daily  . enoxaparin (LOVENOX) injection  40 mg Subcutaneous Daily  . feeding supplement (VITAL AF 1.2 CAL)  1,000 mL Per Tube Q24H  . fentaNYL  50 mcg Transdermal Q72H  . Influenza vac split quadrivalent PF  0.5 mL Intramuscular Tomorrow-1000  . insulin aspart  0-15 Units Subcutaneous 6 times per day  . insulin glargine  20 Units Subcutaneous Daily  . midodrine  30 mg Oral TID WC  . multivitamin with minerals  1 tablet Oral Daily  . pantoprazole sodium  40 mg Oral Daily  . pregabalin  100 mg Oral 3 times per day  . pregabalin  200 mg Oral QHS  . vancomycin  500 mg Intravenous Q12H   Continuous Infusions: . 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  Delfina Redwood, MD [facility]  [email or web address] Password [email or web address] 07/29/2015, 1:44 PM    LOS: 19 days

## 2015-07-30 LAB — BASIC METABOLIC PANEL
Anion gap: 10 (ref 5–15)
BUN: 24 mg/dL — AB (ref 6–20)
CHLORIDE: 103 mmol/L (ref 101–111)
CO2: 21 mmol/L — AB (ref 22–32)
CREATININE: 1.03 mg/dL (ref 0.61–1.24)
Calcium: 8.9 mg/dL (ref 8.9–10.3)
GFR calc Af Amer: >60 mL/min (ref >60)
GFR calc non Af Amer: >60 mL/min (ref >60)
Glucose, Bld: 160 mg/dL — ABNORMAL HIGH (ref 65–99)
Potassium: 5.1 mmol/L (ref 3.5–5.1)
SODIUM: 134 mmol/L — AB (ref 135–145)

## 2015-07-30 LAB — GLUCOSE, CAPILLARY
GLUCOSE-CAPILLARY: 140 mg/dL — AB (ref 65–99)
GLUCOSE-CAPILLARY: 151 mg/dL — AB (ref 65–99)
GLUCOSE-CAPILLARY: 203 mg/dL — AB (ref 65–99)
Glucose-Capillary: 143 mg/dL — ABNORMAL HIGH (ref 65–99)
Glucose-Capillary: 122 mg/dL — ABNORMAL HIGH (ref 65–99)
Glucose-Capillary: 209 mg/dL — ABNORMAL HIGH (ref 65–99)

## 2015-07-30 LAB — CBC WITH DIFFERENTIAL/PLATELET
Basophils Absolute: 0.1 10*3/uL (ref 0.0–0.1)
Basophils Relative: 1 %
EOS ABS: 0.7 10*3/uL (ref 0.0–0.7)
EOS PCT: 4 %
HCT: 30.6 % — ABNORMAL LOW (ref 39.0–52.0)
Hemoglobin: 9.7 g/dL — ABNORMAL LOW (ref 13.0–17.0)
LYMPHS ABS: 2.4 10*3/uL (ref 0.7–4.0)
Lymphocytes Relative: 14 %
MCH: 27.5 pg (ref 26.0–34.0)
MCHC: 31.7 g/dL (ref 30.0–36.0)
MCV: 86.7 fL (ref 78.0–100.0)
MONOS PCT: 16 %
Monocytes Absolute: 2.6 10*3/uL — ABNORMAL HIGH (ref 0.1–1.0)
Neutro Abs: 10.7 10*3/uL — ABNORMAL HIGH (ref 1.7–7.7)
Neutrophils Relative %: 65 %
PLATELETS: 747 10*3/uL — AB (ref 150–400)
RBC: 3.53 MIL/uL — ABNORMAL LOW (ref 4.22–5.81)
RDW: 15.6 % — ABNORMAL HIGH (ref 11.5–15.5)
WBC: 16.6 10*3/uL — AB (ref 4.0–10.5)

## 2015-07-30 LAB — VANCOMYCIN, TROUGH: Vancomycin Tr: 29 ug/mL (ref 10.0–20.0)

## 2015-07-30 NOTE — Progress Notes (Signed)
Pt vanco level 29 paged Dr. Rogue Bussing.

## 2015-07-30 NOTE — Progress Notes (Signed)
Pharmacy Antibiotic Time-Out Note  Jason [name] is a 29 y.o. year-old male admitted on 07/10/2015.  The patient is currently on vancomycin for osteomyelitis.  Assessment/Plan: Further vancomycin doses will be held due to elevated vancomycin trough.  Will repeat a trough tomorrow and reassess timing of further dosing.   Recent Labs Lab 07/25/15 0312 07/27/15 0055 07/29/15 0945 07/30/15 0435  WBC 19.9* 18.0* 17.7* 16.6*    Recent Labs Lab 07/25/15 0312 07/27/15 0055 07/29/15 0945 07/30/15 0435  CREATININE 0.53* 0.83 0.87 1.03   Estimated Creatinine Clearance: 83.2 mL/min (by C-G formula based on Cr of 1.03). Tmax/24h 101.7  Antimicrobial allergies:  Cefuroxime - anaphylaxis Ertapenem - Rash and confusion - tolerates imipenem Penicillin - Anaphylaxis Sulfa - anaphylaxis  Antimicrobials this admission: Vancomycin 10/3 >>10/12, 10/15 >  Primaxin 10/3 >> 10/11 Fosfomycin x 3 doses per ID   Levels/dose changes this admission: 10/9 VT = 12  10/18 VT = 14 on 750mg  q24 10/22 VT 29 on 500mg  IV q12h (accurate level)  Microbiology Results: 10/2 Blood - ngf 10/3 Blood - ngtd 10/3 urine - > 100k of Pseudomonas (sensitive to Ceftaz and Amikacin only) 10/15 blood x 2>>NG final 10/19 blood x 2 >>  10/21 Urine>>  Thank you for allowing pharmacy to be a part of this patient's care.  Candie Mile PharmD 07/30/2015 10:30 AM

## 2015-07-30 NOTE — Progress Notes (Addendum)
PROGRESS NOTE    Jason [name] ATF:[id number] DOB: 1986/04/15 DOA: 07/10/2015 PCP: [name], MD  HPI/Brief narrative 29 y.o. 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 Patient was recently tapered off his IV antibiotics and was placed on doxycycline and Cipro 3 days prior to this admit for possible UTI.   In the ER patient UA shows features concerning for UTI - patient has chronic suprapubic catheter. Patient was also found to be hypothermic and hypotensive. Patient has had previous history of multiple debridement for chronic sacral decubitus which at this time looks to be healing. Patient also has history of chronic osteomyelitis.    Assessment/Plan:  Toxic metabolic encephalopathy Resolved - patient is presently alert and oriented  Sepsis due to MDR Pseudomonas Aeruginosa UTI ?  Vs decubitus/osteomyelitis - suprapubic catheter changed 10/14.  - ID and plastics following: CT Scan showed: Sacral decubitus ulcer with prior distal sacrococcygeal destruction or resection and foci of gas within the LEFT gluteus maximus question infection. Discussed with Dr. Cristela Blue and Dr. Migdalia Dk. She is unable to get patient on the schedule until Thursday of next week. Still spiking fevers. Medicaid has no LTAC coverage. Infectious disease ordered urine culture and resumed fosfomycin.  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 - Discussed with CCM M.D. on call 10/16: Agrees with outpatient follow-up with trach clinic and indicates that he may not be decannulated.   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 IVC filter placed 8/6  History of spinal canal injury with paraplegia  Severe malnutrition in context of chronic illness, Underweight On night time TF  DVT Prophylaxis: Lovenox Code Status: Full Family Communication: updated mom Disposition Plan:    Consultants:  PCCM   Infectious disease  Plastics- [facility]  Procedures:  None  Antibiotics:  Imipenem 10/2 > 10/11  Vanc 10/2 > 10/11  Fosfomycin 10/6 > 10/10  Vancomycin 10/15 >   Subjective: No complaints  Objective: Filed Vitals:   07/30/15 0655 07/30/15 0721 07/30/15 0927 07/30/15 1238  BP: 91/52 92/48    Pulse: 88  91 89  Temp: 101.7 F (38.7 C)     TempSrc: Oral     Resp: 18     Height:      Weight:      SpO2: 95%  99% 99%    Intake/Output Summary (Last 24 hours) at 07/30/15 1258 Last data filed at 07/30/15 0850  Gross per 24 hour  Intake   2424 ml  Output    350 ml  Net   2074 ml   Filed Weights   07/28/15 0352 07/29/15 0629 07/30/15 0500  Weight: 57.153 kg (126 lb) 50.5 kg (111 lb 5.3 oz) 56.609 kg (124 lb 12.8 oz)     Exam:  General exam: Comfortable. Nontoxic appearing. Eating supper Respiratory system: Clear. No increased work of breathing. Cardiovascular system: S1 & S2 heard, RRR. No JVD, murmurs, gallops, clicks or pedal edema. Gastrointestinal  system: Abdomen is nondistended, soft and nontender. Normal bowel sounds heard.- ostomy functioning Extremities: Edema noted  Data Reviewed: Basic Metabolic Panel:  Recent Labs Lab 07/25/15 0312 07/27/15 0055 07/29/15 0945 07/30/15 0435  NA 130* 131* 129* 134*  K 4.7 4.4 4.4 5.1  CL 97* 97* 98* 103  CO2 26 24 21* 21*  GLUCOSE 221* 198* 165* 160*  BUN 12 17 21* 24*  CREATININE 0.53* 0.83 0.87 1.03  CALCIUM 8.4* 8.8* 8.5* 8.9   Liver Function Tests:  Recent Labs Lab 07/25/15 0312  AST 60*  ALT 53  ALKPHOS 135*  BILITOT 0.4  PROT 6.6  ALBUMIN 1.8*   No results for input(s): LIPASE, AMYLASE in the last 168 hours. No results for input(s): AMMONIA in the last 168 hours. CBC:  Recent Labs Lab 07/25/15 0312 07/27/15 0055 07/29/15 0945 07/30/15 0435  WBC 19.9* 18.0* 17.7* 16.6*  NEUTROABS  --   --   --  10.7*  HGB 9.3* 10.0* 9.2* 9.7*  HCT 30.2* 30.1* 30.3* 30.6*  MCV 86.3 83.8 87.6 86.7  PLT 649* 572* 819* 747*   Cardiac Enzymes: No results for input(s): CKTOTAL, CKMB, CKMBINDEX, TROPONINI in the last 168 hours. BNP (last 3 results) No results for input(s): PROBNP in the last 8760 hours. CBG:  Recent Labs Lab 07/29/15 1933 07/30/15 0025 07/30/15 0337 07/30/15 0755 07/30/15 1241  GLUCAP 131* 203* 140* 143* 151*    Recent Results (from the past 240 hour(s))  Culture, blood (routine x 2)     Status: None   Collection Time: 07/23/15 10:35 PM  Result Value Ref Range Status   Specimen Description BLOOD RIGHT ARM  Final   Special Requests IN PEDIATRIC BOTTLE 3CC  Final   Culture NO GROWTH 5 DAYS  Final   Report Status 07/28/2015 FINAL  Final  Culture, blood (routine x 2)     Status: None   Collection Time: 07/23/15 10:45 PM  Result Value Ref Range Status   Specimen Description BLOOD RIGHT ANTECUBITAL  Final   Special Requests BOTTLES DRAWN AEROBIC AND ANAEROBIC 5CC   Final   Culture NO GROWTH 5 DAYS  Final   Report Status 07/28/2015 FINAL  Final   Culture, blood (routine x 2)     Status: None (Preliminary result)   Collection Time: 07/27/15  7:05 AM  Result Value Ref Range Status   Specimen Description BLOOD RIGHT HAND  Final   Special Requests BOTTLES DRAWN AEROBIC ONLY 3CC  Final   Culture NO GROWTH 3 DAYS  Final   Report Status PENDING  Incomplete  Culture, blood (routine x 2)     Status: None (Preliminary result)   Collection Time: 07/27/15  7:10 AM  Result Value Ref Range Status   Specimen Description BLOOD RIGHT ARM  Final   Special Requests BOTTLES DRAWN AEROBIC ONLY 3CC  Final   Culture NO GROWTH 3 DAYS  Final   Report Status PENDING  Incomplete  Culture, Urine     Status: None (Preliminary result)   Collection  Time: 07/29/15  6:06 PM  Result Value Ref Range Status   Specimen Description URINE, SUPRAPUBIC  Final   Special Requests Normal  Final   Culture CULTURE REINCUBATED FOR BETTER GROWTH  Final   Report Status PENDING  Incomplete           Studies: No results found.      Scheduled Meds: . baclofen  10 mg Oral 2 times per day  . baclofen  20 mg Oral QHS  . chlorhexidine gluconate  15 mL Mouth Rinse BID  . collagenase   Topical Daily  . enoxaparin (LOVENOX) injection  40 mg Subcutaneous Daily  . feeding supplement (VITAL AF 1.2 CAL)  1,000 mL Per Tube Q24H  . fentaNYL  50 mcg Transdermal Q72H  . Influenza vac split quadrivalent PF  0.5 mL Intramuscular Tomorrow-1000  . insulin aspart  0-15 Units Subcutaneous 6 times per day  . insulin glargine  20 Units Subcutaneous Daily  . midodrine  30 mg Oral TID WC  . multivitamin with minerals  1 tablet Oral Daily  . pantoprazole sodium  40 mg Oral Daily  . pregabalin  100 mg Oral 3 times per day  . pregabalin  200 mg Oral QHS   Continuous Infusions: . 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  Delfina Redwood, MD [facility]  [email or web address] Password [email or web address] 07/30/2015, 12:58 PM    LOS: 20 days

## 2015-07-31 LAB — GLUCOSE, CAPILLARY
GLUCOSE-CAPILLARY: 176 mg/dL — AB (ref 65–99)
GLUCOSE-CAPILLARY: 189 mg/dL — AB (ref 65–99)
GLUCOSE-CAPILLARY: 195 mg/dL — AB (ref 65–99)
GLUCOSE-CAPILLARY: 299 mg/dL — AB (ref 65–99)
Glucose-Capillary: 189 mg/dL — ABNORMAL HIGH (ref 65–99)
Glucose-Capillary: 173 mg/dL — ABNORMAL HIGH (ref 65–99)
Glucose-Capillary: 176 mg/dL — ABNORMAL HIGH (ref 65–99)

## 2015-07-31 LAB — VANCOMYCIN, RANDOM: VANCOMYCIN RM: 17 ug/mL

## 2015-07-31 MED ORDER — VANCOMYCIN HCL IN DEXTROSE 750-5 MG/150ML-% IV SOLN
750.0000 mg | INTRAVENOUS | Status: DC
Start: 2015-07-31 — End: 2015-08-01
  Administered 2015-07-31: 750 mg via INTRAVENOUS
  Filled 2015-07-31: qty 150

## 2015-07-31 NOTE — Progress Notes (Signed)
PROGRESS NOTE    Jason [name] WJX:[id number] DOB: [birth year] DOA: 07/10/2015 PCP: [name], MD  HPI/Brief narrative 29 y.o. 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 Patient was recently tapered off his IV antibiotics and was placed on doxycycline and Cipro 3 days prior to this admit for possible UTI.   In the ER patient UA shows features concerning for UTI - patient has chronic suprapubic catheter. Patient was also found to be hypothermic and hypotensive. Patient has had previous history of multiple debridement for chronic sacral decubitus which at this time looks to be healing. Patient also has history of chronic osteomyelitis.    Assessment/Plan:  Toxic metabolic encephalopathy Resolved - patient is presently alert and oriented  Sepsis due to MDR Pseudomonas Aeruginosa UTI ?  Vs decubitus/osteomyelitis - suprapubic catheter changed 10/14.  - ID and plastics following: CT Scan showed: Sacral decubitus ulcer with prior distal sacrococcygeal destruction or resection and foci of gas within the LEFT gluteus maximus question infection. Discussed with Dr. Cristela Blue and Dr. Migdalia Dk. She is unable to get patient on the schedule until Thursday of next week. Still spiking fevers, but trending douwn. Medicaid has no LTAC coverage. Infectious disease ordered urine culture and resumed fosfomycin.  Acute renal failure Due to sepsis - renal function has normalized  Hyponatremia  Mild, monitor  Hypomagnesemia  Replaced to goal of 2.0  Diabetes mellitus type 1 with hyperglycemia Improved on higher lantus dose  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 Plastics/ID consulted  Chronic anemia Hgb initially dropped w/  volume expansion - s/p 2U PRBC - no evidence of acute blood loss - hemoglobin appears stable at this time   Chronic respiratory failure with trach and vent pulmonary critical care supposed to be managing vent - it appears the patient took himself off the vent 10/7 and has not been back on since - currently appears stable on 28% trach collar  - As per PCCM ELink note 10/3, it appears that patient is followed in OP trach clinic> will defer further trach decisions to OP follow up in that clinic. - Discussed with CCM M.D. on call 10/16: Agrees with outpatient follow-up with trach clinic and indicates that he may not be decannulated.   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 IVC filter placed 8/6  History of spinal canal injury with paraplegia  Severe malnutrition in context of chronic illness, Underweight On night time TF  DVT Prophylaxis: Lovenox Code Status: Full Family Communication: mom and dad at bedside Disposition Plan:    Consultants:  PCCM   Infectious disease  Plastics- [facility]  Procedures:  None  Antibiotics:  Imipenem 10/2 > 10/11  Vanc 10/2 > 10/11  Fosfomycin 10/6 >   Vancomycin 10/15 >   Subjective: No complaints  Objective: Filed Vitals:   07/31/15 0424 07/31/15 0500 07/31/15 0746 07/31/15 1148  BP:  96/54    Pulse:  93 92 89  Temp:  99.5 F (37.5 C)    TempSrc:  Oral    Resp:  20 18 16   Height:      Weight: 57.38 kg (126 lb 8 oz)     SpO2:  92% 100% 100%    Intake/Output Summary (Last 24 hours) at 07/31/15 1237 Last data filed at 07/31/15 1100  Gross per 24 hour  Intake 1186.33 ml  Output    350 ml  Net 836.33 ml   Filed Weights   07/29/15 0629 07/30/15 0500 07/31/15 0424  Weight: 50.5 kg (111 lb 5.3 oz) 56.609 kg (124 lb 12.8 oz) 57.38 kg (126 lb 8 oz)     Exam:  General exam: Comfortable. Nontoxic appearing. Watching TV and smiling Respiratory system: Clear. No increased work of breathing. Cardiovascular system: S1 & S2 heard, RRR. No JVD,  murmurs, gallops, clicks or pedal edema. Gastrointestinal system: Abdomen is nondistended, soft and nontender. Normal bowel sounds heard.- ostomy functioning Extremities: Edema noted  Data Reviewed: Basic Metabolic Panel:  Recent Labs Lab 07/25/15 0312 07/27/15 0055 07/29/15 0945 07/30/15 0435  NA 130* 131* 129* 134*  K 4.7 4.4 4.4 5.1  CL 97* 97* 98* 103  CO2 26 24 21* 21*  GLUCOSE 221* 198* 165* 160*  BUN 12 17 21* 24*  CREATININE 0.53* 0.83 0.87 1.03  CALCIUM 8.4* 8.8* 8.5* 8.9   Liver Function Tests:  Recent Labs Lab 07/25/15 0312  AST 60*  ALT 53  ALKPHOS 135*  BILITOT 0.4  PROT 6.6  ALBUMIN 1.8*   No results for input(s): LIPASE, AMYLASE in the last 168 hours. No results for input(s): AMMONIA in the last 168 hours. CBC:  Recent Labs Lab 07/25/15 0312 07/27/15 0055 07/29/15 0945 07/30/15 0435  WBC 19.9* 18.0* 17.7* 16.6*  NEUTROABS  --   --   --  10.7*  HGB 9.3* 10.0* 9.2* 9.7*  HCT 30.2* 30.1* 30.3* 30.6*  MCV 86.3 83.8 87.6 86.7  PLT 649* 572* 819* 747*   Cardiac Enzymes: No results for input(s): CKTOTAL, CKMB, CKMBINDEX, TROPONINI in the last 168 hours. BNP (last 3 results) No results for input(s): PROBNP in the last 8760 hours. CBG:  Recent Labs Lab 07/30/15 2356 07/31/15 0047 07/31/15 0406 07/31/15 0747 07/31/15 1214  GLUCAP 189* 189* 173* 176* 176*    Recent Results (from the past 240 hour(s))  Culture, blood (routine x 2)     Status: None   Collection Time: 07/23/15 10:35 PM  Result Value Ref Range Status   Specimen Description BLOOD RIGHT ARM  Final   Special Requests IN PEDIATRIC BOTTLE 3CC  Final   Culture NO GROWTH 5 DAYS  Final   Report Status 07/28/2015 FINAL  Final  Culture, blood (routine x 2)     Status: None   Collection Time: 07/23/15 10:45 PM  Result Value Ref Range Status   Specimen Description BLOOD RIGHT ANTECUBITAL  Final   Special Requests BOTTLES DRAWN AEROBIC AND ANAEROBIC 5CC   Final   Culture NO GROWTH 5  DAYS  Final   Report Status 07/28/2015 FINAL  Final  Culture, blood (routine x 2)     Status: None (Preliminary result)   Collection Time: 07/27/15  7:05 AM  Result Value Ref Range Status   Specimen Description BLOOD RIGHT HAND  Final   Special Requests BOTTLES DRAWN AEROBIC ONLY 3CC  Final   Culture NO GROWTH 4 DAYS  Final   Report Status PENDING  Incomplete  Culture, blood (routine x 2)     Status: None (Preliminary result)   Collection Time: 07/27/15  7:10 AM  Result Value Ref Range Status   Specimen Description BLOOD RIGHT ARM  Final   Special Requests BOTTLES DRAWN AEROBIC ONLY 3CC  Final   Culture NO GROWTH 4 DAYS  Final   Report Status PENDING  Incomplete  Culture, Urine  Status: None (Preliminary result)   Collection Time: 07/29/15  6:06 PM  Result Value Ref Range Status   Specimen Description URINE, SUPRAPUBIC  Final   Special Requests Normal  Final   Culture CULTURE REINCUBATED FOR BETTER GROWTH  Final   Report Status PENDING  Incomplete           Studies: No results found.      Scheduled Meds: . baclofen  10 mg Oral 2 times per day  . baclofen  20 mg Oral QHS  . collagenase   Topical Daily  . enoxaparin (LOVENOX) injection  40 mg Subcutaneous Daily  . feeding supplement (VITAL AF 1.2 CAL)  1,000 mL Per Tube Q24H  . fentaNYL  50 mcg Transdermal Q72H  . Influenza vac split quadrivalent PF  0.5 mL Intramuscular Tomorrow-1000  . insulin aspart  0-15 Units Subcutaneous 6 times per day  . insulin glargine  20 Units Subcutaneous Daily  . midodrine  30 mg Oral TID WC  . multivitamin with minerals  1 tablet Oral Daily  . pantoprazole sodium  40 mg Oral Daily  . pregabalin  100 mg Oral 3 times per day  . pregabalin  200 mg Oral QHS   Continuous Infusions: . 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  Delfina Redwood, MD [facility]  [email or web address] Password [email or web address] 07/31/2015, 12:37 PM    LOS: 21 days

## 2015-07-31 NOTE — Progress Notes (Signed)
Pharmacy Antibiotic Time-Out Note  Jason [name] is a 29 y.o. year-old male admitted on 07/10/2015.  The patient is currently on vancomycin for osteomyelitis.  Assessment/Plan: Mr. Portell is not clearing vancomycin as readily.  Creatinine has risen.  His level this morning has decreased to 17 so will resume vancomycin at a reduced dose.  Vancomycin 750mg  IV q48h.  BMET in AM.    Recent Labs Lab 07/25/15 0312 07/27/15 0055 07/29/15 0945 07/30/15 0435  WBC 19.9* 18.0* 17.7* 16.6*     Recent Labs Lab 07/25/15 0312 07/27/15 0055 07/29/15 0945 07/30/15 0435  CREATININE 0.53* 0.83 0.87 1.03   Estimated Creatinine Clearance: 84.4 mL/min (by C-G formula based on Cr of 1.03). Tmax/24h 100.9  Antimicrobial allergies:  Cefuroxime - anaphylaxis Ertapenem - Rash and confusion - tolerates imipenem Penicillin - Anaphylaxis Sulfa - anaphylaxis  Antimicrobials this admission: Vancomycin 10/3 >>10/12, 10/15 >  Primaxin 10/3 >> 10/11 Fosfomycin x 3 doses per ID   Levels/dose changes this admission: 10/9 VT = 12  10/18 VT = 14 on 750mg  q24 10/22 VT 29 on 500mg  IV q12h (accurate level) 10/23 VT 17 - ke 0.0222, half life 31 hours  Microbiology Results: 10/2 Blood - ngf 10/3 Blood - ngtd 10/3 urine - > 100k of Pseudomonas (sensitive to Ceftaz and Amikacin only) 10/15 blood x 2>>NG final 10/19 blood x 2 >>  10/21 Urine>>  Thank you for allowing pharmacy to be a part of this patient's care.  Candie Mile PharmD 07/31/2015 1:22 PM

## 2015-07-31 NOTE — Progress Notes (Signed)
Patient states he has not needed humidification or oxygen and not worn his trach collar lately, he prefers to keep it off at this time. No distress noted, will continue to monitor.

## 2015-07-31 NOTE — Progress Notes (Signed)
RT called to pt room by RN because "his trach felt weird to him."  Airway is patent with trach in the correct place.  Suction catheter passed well with minimal secretion return.  Pt states his "voice doesn't feel as strong."  Inner cannula and guaze changed.  PMV replaced and his voice "feels stronger" to him.  No complications noted.

## 2015-08-01 DIAGNOSIS — B3749 Other urogenital candidiasis: Secondary | ICD-10-CM

## 2015-08-01 LAB — BASIC METABOLIC PANEL
Anion gap: 8 (ref 5–15)
BUN: 16 mg/dL (ref 6–20)
CHLORIDE: 103 mmol/L (ref 101–111)
CO2: 23 mmol/L (ref 22–32)
Calcium: 8.8 mg/dL — ABNORMAL LOW (ref 8.9–10.3)
Creatinine, Ser: 0.76 mg/dL (ref 0.61–1.24)
GFR calc non Af Amer: >60 mL/min (ref >60)
Glucose, Bld: 159 mg/dL — ABNORMAL HIGH (ref 65–99)
POTASSIUM: 4.8 mmol/L (ref 3.5–5.1)
SODIUM: 134 mmol/L — AB (ref 135–145)

## 2015-08-01 LAB — GLUCOSE, CAPILLARY
GLUCOSE-CAPILLARY: 118 mg/dL — AB (ref 65–99)
GLUCOSE-CAPILLARY: 151 mg/dL — AB (ref 65–99)
GLUCOSE-CAPILLARY: 180 mg/dL — AB (ref 65–99)
GLUCOSE-CAPILLARY: 183 mg/dL — AB (ref 65–99)
GLUCOSE-CAPILLARY: 183 mg/dL — AB (ref 65–99)
GLUCOSE-CAPILLARY: 214 mg/dL — AB (ref 65–99)

## 2015-08-01 LAB — CULTURE, BLOOD (ROUTINE X 2)
Culture: NO GROWTH
Culture: NO GROWTH

## 2015-08-01 MED ORDER — INSULIN ASPART 100 UNIT/ML ~~LOC~~ SOLN
0.0000 [IU] | Freq: Three times a day (TID) | SUBCUTANEOUS | Status: DC
  Administered 2015-08-01 – 2015-08-02 (×2): 3 [IU] via SUBCUTANEOUS
  Administered 2015-08-02 (×2): 5 [IU] via SUBCUTANEOUS
  Administered 2015-08-03: 2 [IU] via SUBCUTANEOUS
  Administered 2015-08-03: 5 [IU] via SUBCUTANEOUS
  Administered 2015-08-03 – 2015-08-05 (×3): 15 [IU] via SUBCUTANEOUS

## 2015-08-01 MED ORDER — FLUCONAZOLE 100 MG PO TABS
200.0000 mg | ORAL_TABLET | Freq: Every day | ORAL | Status: DC
  Administered 2015-08-01: 200 mg via ORAL
  Filled 2015-08-01: qty 2

## 2015-08-01 MED ORDER — INSULIN ASPART 100 UNIT/ML ~~LOC~~ SOLN: 0.0000 [IU] | Freq: Every day | SUBCUTANEOUS | Status: DC

## 2015-08-01 NOTE — Progress Notes (Signed)
Patient ID: JARIAH JARMON, male   DOB: 10/23/1985, 29 y.o.   MRN: [id number]  I was consulted today to replace the patient's SP tube and sent an order to have a foley at the bedside.   I called to see if the catheter was there yet and his nurse informed me that the tube was changed yesterday and is draining well.     I attempted to call Dr. Eliseo Squires but was unable to reach her.  I am under the impression that the consult is no longer needed, but if this is not the case please call me at [phone].

## 2015-08-01 NOTE — Progress Notes (Signed)
PROGRESS NOTE    Jason [name] OIN:[id number] DOB: 02/24/1986 DOA: 07/10/2015 PCP: [name], MD  HPI/Brief narrative 29 y.o. 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 Patient was recently tapered off his IV antibiotics and was placed on doxycycline and Cipro 3 days prior to this admit for possible UTI.   In the ER patient UA shows features concerning for UTI - patient has chronic suprapubic catheter. Patient was also found to be hypothermic and hypotensive. Patient has had previous history of multiple debridement for chronic sacral decubitus which at this time looks to be healing. Patient also has history of chronic osteomyelitis.    Assessment/Plan:  Toxic metabolic encephalopathy Resolved - patient is presently alert and oriented  Sepsis due to MDR Pseudomonas Aeruginosa UTI ?  Vs decubitus/osteomyelitis - suprapubic catheter changed 10/14.  - ID and plastics following: CT Scan showed: Sacral decubitus ulcer with prior distal sacrococcygeal destruction or resection and foci of gas within the LEFT gluteus maximus question infection. - Dr. Migdalia Dk. She is unable to get patient on the schedule until Thursday of next week. Still spiking fevers, but trending douwn. Medicaid has no LTAC coverage. Infectious disease ordered urine culture and resumed fosfomycin-- growing yeast and pseudomonas - Dr. 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 - Discussed with CCM M.D. on call 10/16: Agrees with outpatient follow-up with trach clinic and indicates that he may not be decannulated.   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 IVC filter placed 8/6  History of spinal canal injury with paraplegia  Severe malnutrition in context of chronic illness, Underweight On night time TF  DVT Prophylaxis: Lovenox Code Status: Full Family Communication: no family at bedside Disposition Plan:    Consultants:  PCCM   Infectious disease  Plastics- [facility]  Procedures:  None  Antibiotics:  Imipenem 10/2 > 10/11  Vanc 10/2 > 10/11  Fosfomycin 10/6 >   Vancomycin 10/15 >   Subjective: No complaints  Objective: Filed Vitals:   08/01/15 0424 08/01/15 0509 08/01/15 0629 08/01/15 0856  BP:   106/69   Pulse:   83 88  Temp:   100.9 F (38.3 C)   TempSrc:   Oral   Resp:   20 20  Height:      Weight: 54.2 kg (119 lb 7.8  oz)     SpO2:  97% 95% 95%    Intake/Output Summary (Last 24 hours) at 08/01/15 1034 Last data filed at 08/01/15 0427  Gross per 24 hour  Intake   1798 ml  Output   5000 ml  Net  -3202 ml   Filed Weights   07/30/15 0500 07/31/15 0424 08/01/15 0424  Weight: 56.609 kg (124 lb 12.8 oz) 57.38 kg (126 lb 8 oz) 54.2 kg (119 lb 7.8 oz)     Exam:  General exam:  Comfortable. Nontoxic appearing.  smiling Respiratory system: Clear. No increased work of breathing. Cardiovascular system: S1 & S2 heard, RRR. No JVD, murmurs, gallops, clicks or pedal edema. Gastrointestinal system: Abdomen is nondistended, soft and nontender. Normal bowel sounds heard.- ostomy functioning Extremities: Edema noted  Data Reviewed: Basic Metabolic Panel:  Recent Labs Lab 07/27/15 0055 07/29/15 0945 07/30/15 0435 08/01/15 0541  NA 131* 129* 134* 134*  K 4.4 4.4 5.1 4.8  CL 97* 98* 103 103  CO2 24 21* 21* 23  GLUCOSE 198* 165* 160* 159*  BUN 17 21* 24* 16  CREATININE 0.83 0.87 1.03 0.76  CALCIUM 8.8* 8.5* 8.9 8.8*   Liver Function Tests: No results for input(s): AST, ALT, ALKPHOS, BILITOT, PROT, ALBUMIN in the last 168 hours. No results for input(s): LIPASE, AMYLASE in the last 168 hours. No results for input(s): AMMONIA in the last 168 hours. CBC:  Recent Labs Lab 07/27/15 0055 07/29/15 0945 07/30/15 0435  WBC 18.0* 17.7* 16.6*  NEUTROABS  --   --  10.7*  HGB 10.0* 9.2* 9.7*  HCT 30.1* 30.3* 30.6*  MCV 83.8 87.6 86.7  PLT 572* 819* 747*   Cardiac Enzymes: No results for input(s): CKTOTAL, CKMB, CKMBINDEX, TROPONINI in the last 168 hours. BNP (last 3 results) No results for input(s): PROBNP in the last 8760 hours. CBG:  Recent Labs Lab 07/31/15 1636 07/31/15 2032 07/31/15 2342 08/01/15 0413 08/01/15 0754  GLUCAP 195* 299* 183* 118* 183*    Recent Results (from the past 240 hour(s))  Culture, blood (routine x 2)     Status: None   Collection Time: 07/23/15 10:35 PM  Result Value Ref Range Status   Specimen Description BLOOD RIGHT ARM  Final   Special Requests IN PEDIATRIC BOTTLE 3CC  Final   Culture NO GROWTH 5 DAYS  Final   Report Status 07/28/2015 FINAL  Final  Culture, blood (routine x 2)     Status: None   Collection Time: 07/23/15 10:45 PM  Result Value Ref Range Status   Specimen Description BLOOD RIGHT ANTECUBITAL  Final    Special Requests BOTTLES DRAWN AEROBIC AND ANAEROBIC 5CC   Final   Culture NO GROWTH 5 DAYS  Final   Report Status 07/28/2015 FINAL  Final  Culture, blood (routine x 2)     Status: None (Preliminary result)   Collection Time: 07/27/15  7:05 AM  Result Value Ref Range Status   Specimen Description BLOOD RIGHT HAND  Final   Special Requests BOTTLES DRAWN AEROBIC ONLY 3CC  Final   Culture NO GROWTH 4 DAYS  Final   Report Status PENDING  Incomplete  Culture, blood (routine x 2)     Status: None (Preliminary result)   Collection Time: 07/27/15  7:10 AM  Result Value Ref Range Status   Specimen Description BLOOD RIGHT ARM  Final   Special Requests BOTTLES DRAWN AEROBIC ONLY 3CC  Final   Culture NO GROWTH 4 DAYS  Final   Report  Status PENDING  Incomplete  Culture, Urine     Status: None (Preliminary result)   Collection Time: 07/29/15  6:06 PM  Result Value Ref Range Status   Specimen Description URINE, SUPRAPUBIC  Final   Special Requests Normal  Final   Culture   Final    >=100,000 COLONIES/mL PSEUDOMONAS AERUGINOSA >=100,000 COLONIES/mL YEAST    Report Status PENDING  Incomplete           Studies: No results found.      Scheduled Meds: . baclofen  10 mg Oral 2 times per day  . baclofen  20 mg Oral QHS  . collagenase   Topical Daily  . enoxaparin (LOVENOX) injection  40 mg Subcutaneous Daily  . feeding supplement (VITAL AF 1.2 CAL)  1,000 mL Per Tube Q24H  . fentaNYL  50 mcg Transdermal Q72H  . fluconazole  200 mg Oral Daily  . Influenza vac split quadrivalent PF  0.5 mL Intramuscular Tomorrow-1000  . insulin aspart  0-15 Units Subcutaneous 6 times per day  . insulin glargine  20 Units Subcutaneous Daily  . midodrine  30 mg Oral TID WC  . multivitamin with minerals  1 tablet Oral Daily  . pantoprazole sodium  40 mg Oral Daily  . pregabalin  100 mg Oral 3 times per day  . pregabalin  200 mg Oral QHS  . vancomycin  750 mg Intravenous Q48H   Continuous  Infusions: . 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  Eulogio Bear, DO [facility]  [email or web address] Password [email or web address] 08/01/2015, 10:34 AM    LOS: 22 days

## 2015-08-01 NOTE — Progress Notes (Addendum)
[facility] for Infectious Disease    Subjective:   Fevers not as high now  Antibiotics:  Anti-infectives    Start     Dose/Rate Route Frequency Ordered Stop   08/01/15 1000  fluconazole (DIFLUCAN) tablet 200 mg     200 mg Oral Daily 08/01/15 0839     07/31/15 1500  vancomycin (VANCOCIN) IVPB 750 mg/150 ml premix     750 mg 150 mL/hr over 60 Minutes Intravenous Every 48 hours 07/31/15 1322     07/27/15 1800  vancomycin (VANCOCIN) 500 mg in sodium chloride 0.9 % 100 mL IVPB     500 mg 100 mL/hr over 60 Minutes Intravenous Every 12 hours 07/27/15 0048 07/29/15 1846   07/23/15 2300  vancomycin (VANCOCIN) IVPB 750 mg/150 ml premix  Status:  Discontinued     750 mg 150 mL/hr over 60 Minutes Intravenous Every 24 hours 07/23/15 2215 07/27/15 0048   07/18/15 0000  vancomycin (VANCOCIN) IVPB 750 mg/150 ml premix     750 mg 150 mL/hr over 60 Minutes Intravenous Every 24 hours 07/17/15 0508 07/19/15 0123   07/17/15 0100  vancomycin (VANCOCIN) 500 mg in sodium chloride 0.9 % 100 mL IVPB  Status:  Discontinued     500 mg 100 mL/hr over 60 Minutes Intravenous Every 24 hours 07/16/15 1016 07/17/15 0507   07/13/15 2000  imipenem-cilastatin (PRIMAXIN) 250 mg in sodium chloride 0.9 % 100 mL IVPB     250 mg 200 mL/hr over 30 Minutes Intravenous 3 times per day 07/13/15 1046 07/19/15 2256   07/11/15 2200  imipenem-cilastatin (PRIMAXIN) 250 mg in sodium chloride 0.9 % 100 mL IVPB  Status:  Discontinued     250 mg 200 mL/hr over 30 Minutes Intravenous Every 12 hours 07/11/15 1020 07/13/15 1046   07/10/15 2230  imipenem-cilastatin (PRIMAXIN) 250 mg in sodium chloride 0.9 % 100 mL IVPB  Status:  Discontinued     250 mg 200 mL/hr over 30 Minutes Intravenous 3 times per day 07/10/15 2226 07/11/15 1020   07/10/15 2230  vancomycin (VANCOCIN) 500 mg in sodium chloride 0.9 % 100 mL IVPB  Status:  Discontinued     500 mg 100 mL/hr over 60 Minutes Intravenous Every 24 hours 07/10/15  2226 07/16/15 1016      Medications: Scheduled Meds: . baclofen  10 mg Oral 2 times per day  . baclofen  20 mg Oral QHS  . collagenase   Topical Daily  . enoxaparin (LOVENOX) injection  40 mg Subcutaneous Daily  . feeding supplement (VITAL AF 1.2 CAL)  1,000 mL Per Tube Q24H  . fentaNYL  50 mcg Transdermal Q72H  . fluconazole  200 mg Oral Daily  . Influenza vac split quadrivalent PF  0.5 mL Intramuscular Tomorrow-1000  . insulin aspart  0-15 Units Subcutaneous 6 times per day  . insulin glargine  20 Units Subcutaneous Daily  . midodrine  30 mg Oral TID WC  . multivitamin with minerals  1 tablet Oral Daily  . pantoprazole sodium  40 mg Oral Daily  . pregabalin  100 mg Oral 3 times per day  . pregabalin  200 mg Oral QHS  . vancomycin  750 mg Intravenous Q48H   Continuous Infusions: . sodium chloride 10 mL/hr at 07/29/15 2139   PRN Meds:.acetaminophen **OR** acetaminophen, albuterol, LORazepam, mirtazapine, ondansetron **OR** ondansetron (ZOFRAN) IV, oxyCODONE, RESOURCE THICKENUP CLEAR    Objective: Weight change: -7 lb 0.2 oz (-3.18  kg)  Intake/Output Summary (Last 24 hours) at 08/01/15 1334 Last data filed at 08/01/15 0427  Gross per 24 hour  Intake   1798 ml  Output   4950 ml  Net  -3152 ml   Blood pressure 106/69, pulse 88, temperature 100.9 F (38.3 C), temperature source Oral, resp. rate 20, height 5\' 7"  (1.702 m), weight 119 lb 7.8 oz (54.2 kg), SpO2 96 %. Temp:  [98.1 F (36.7 C)-100.9 F (38.3 C)] 100.9 F (38.3 C) (10/24 0629) Pulse Rate:  [83-94] 88 (10/24 0856) Resp:  [16-20] 20 (10/24 0856) BP: (106-161)/(69-83) 106/69 mmHg (10/24 0629) SpO2:  [94 %-100 %] 96 % (10/24 1225) FiO2 (%):  [21 %] 21 % (10/24 0509) Weight:  [119 lb 7.8 oz (54.2 kg)] 119 lb 7.8 oz (54.2 kg) (10/24 0424)  Physical Exam: General: Alert and awake, oriented  Lungs: rhonchi throughout  SKin not examined today  Urine seems clearer in bag  07/27/15:      Neuro: no new  deficits  CBC: CBC Latest Ref Rng 07/30/2015 07/29/2015 07/27/2015  WBC 4.0 - 10.5 K/uL 16.6(H) 17.7(H) 18.0(H)  Hemoglobin 13.0 - 17.0 g/dL 9.7(L) 9.2(L) 10.0(L)  Hematocrit 39.0 - 52.0 % 30.6(L) 30.3(L) 30.1(L)  Platelets 150 - 400 K/uL 747(H) 819(H) 572(H)       BMET  Recent Labs  07/30/15 0435 08/01/15 0541  NA 134* 134*  K 5.1 4.8  CL 103 103  CO2 21* 23  GLUCOSE 160* 159*  BUN 24* 16  CREATININE 1.03 0.76  CALCIUM 8.9 8.8*     Liver Panel  No results for input(s): PROT, ALBUMIN, AST, ALT, ALKPHOS, BILITOT, BILIDIR, IBILI in the last 72 hours.     Sedimentation Rate No results for input(s): ESRSEDRATE in the last 72 hours. C-Reactive Protein No results for input(s): CRP in the last 72 hours.  Micro Results: Recent Results (from the past 720 hour(s))  Blood culture (routine x 2)     Status: None   Collection Time: 07/10/15  4:25 PM  Result Value Ref Range Status   Specimen Description BLOOD LEFT ARM  Final   Special Requests BOTTLES DRAWN AEROBIC AND ANAEROBIC 5CC  Final   Culture NO GROWTH 5 DAYS  Final   Report Status 07/15/2015 FINAL  Final  MRSA PCR Screening     Status: None   Collection Time: 07/11/15  1:00 AM  Result Value Ref Range Status   MRSA by PCR NEGATIVE NEGATIVE Final    Comment:        The GeneXpert MRSA Assay (FDA approved for NASAL specimens only), is one component of a comprehensive MRSA colonization surveillance program. It is not intended to diagnose MRSA infection nor to guide or monitor treatment for MRSA infections.   Blood culture (routine x 2)     Status: None   Collection Time: 07/11/15  4:19 AM  Result Value Ref Range Status   Specimen Description BLOOD RIGHT FOREARM  Final   Special Requests BOTTLES DRAWN AEROBIC ONLY 2CC  Final   Culture NO GROWTH 5 DAYS  Final   Report Status 07/16/2015 FINAL  Final  Urine culture     Status: None   Collection Time: 07/11/15  5:09 PM  Result Value Ref Range Status    Specimen Description URINE, RANDOM  Final   Special Requests NONE  Final   Culture   Final    >=100,000 COLONIES/mL PSEUDOMONAS AERUGINOSA AMIKACIN MIC=8 SENSITIVE COLISTIN MIC=1 SENSITIVE >=100,000 COLONIES/mL YEAST  Report Status 07/27/2015 FINAL  Final   Organism ID, Bacteria PSEUDOMONAS AERUGINOSA  Final      Susceptibility   Pseudomonas aeruginosa - MIC*    CEFTAZIDIME 8 SENSITIVE Sensitive     CIPROFLOXACIN >=4 RESISTANT Resistant     GENTAMICIN >=16 RESISTANT Resistant     IMIPENEM >=16 RESISTANT Resistant     CEFEPIME 16 INTERMEDIATE Intermediate     AZTREONAM Value in next row       >=16RESISTANT    * >=100,000 COLONIES/mL PSEUDOMONAS AERUGINOSA  Culture, blood (routine x 2)     Status: None   Collection Time: 07/23/15 10:35 PM  Result Value Ref Range Status   Specimen Description BLOOD RIGHT ARM  Final   Special Requests IN PEDIATRIC BOTTLE 3CC  Final   Culture NO GROWTH 5 DAYS  Final   Report Status 07/28/2015 FINAL  Final  Culture, blood (routine x 2)     Status: None   Collection Time: 07/23/15 10:45 PM  Result Value Ref Range Status   Specimen Description BLOOD RIGHT ANTECUBITAL  Final   Special Requests BOTTLES DRAWN AEROBIC AND ANAEROBIC 5CC   Final   Culture NO GROWTH 5 DAYS  Final   Report Status 07/28/2015 FINAL  Final  Culture, blood (routine x 2)     Status: None (Preliminary result)   Collection Time: 07/27/15  7:05 AM  Result Value Ref Range Status   Specimen Description BLOOD RIGHT HAND  Final   Special Requests BOTTLES DRAWN AEROBIC ONLY 3CC  Final   Culture NO GROWTH 4 DAYS  Final   Report Status PENDING  Incomplete  Culture, blood (routine x 2)     Status: None (Preliminary result)   Collection Time: 07/27/15  7:10 AM  Result Value Ref Range Status   Specimen Description BLOOD RIGHT ARM  Final   Special Requests BOTTLES DRAWN AEROBIC ONLY 3CC  Final   Culture NO GROWTH 4 DAYS  Final   Report Status PENDING  Incomplete  Culture, Urine      Status: None (Preliminary result)   Collection Time: 07/29/15  6:06 PM  Result Value Ref Range Status   Specimen Description URINE, SUPRAPUBIC  Final   Special Requests Normal  Final   Culture   Final    >=100,000 COLONIES/mL PSEUDOMONAS AERUGINOSA >=100,000 COLONIES/mL YEAST    Report Status PENDING  Incomplete    Studies/Results: No results found.    Assessment/Plan:  INTERVAL HISTORY:   10/17/16Patients wounds with improved appearance per [facility]  07/26/15" CT scan concerning for worsening osteomyelitis in pelvis 07/27/15: copious secretions suctioned 07/29/15: Plastic Surgery plans surgery next Thursday  Principal Problem:   Acute encephalopathy Active Problems:   Palliative care encounter   Chronic anemia   Uncontrolled type 1 diabetes mellitus with hyperglycemia ([name])   Altered mental status   Tracheostomy dependent ([name])   H/O spinal cord injury   Chronic paraplegia ([name])   Acute on chronic respiratory failure ([name])   Hypomagnesemia   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 Pt stated his urine had begun "looking dirty" but he only got fosfomycin that MAY have been active vs this organism, . WOC  stated that his wounds were looking much improved at the bedside. His CT without contrast was unrevealing CT WITH contrast and this showed an area   Sacral decubitus ulcer with prior distal sacrococcygeal destruction or resection and foci of gas within the LEFT gluteus maximus question infection.  Looks to be NEW and due to infection progressing  Greatly appreciate  Dr. Eusebio Friendly help to take him to the OR for debridement of  this area and it will help [name] in ID if we have [place], TISSUE to help guide therapy esp give his hx of MDRO  I am STOPPING his vancomycin today to increase yield on cultures on Thursday  He is not candidate for Ltach.   NOW his urine had been opaque and ua with mx WBC and culture with MDR pseudomonas and yeast  I wanted to make sure his suprapubic was changed out given yeast in urine  He is on fosfomycin again and lab to check sensis for this and colistin (it is amikacin S) I have asked if they can find sensi to zerbaxa and avycaz          LOS: 22 days   [name] 08/01/2015, 1:34 PM

## 2015-08-02 DIAGNOSIS — B49 Unspecified mycosis: Secondary | ICD-10-CM

## 2015-08-02 DIAGNOSIS — J90 Pleural effusion, not elsewhere classified: Secondary | ICD-10-CM

## 2015-08-02 LAB — PATHOLOGIST SMEAR REVIEW

## 2015-08-02 LAB — BASIC METABOLIC PANEL
ANION GAP: 8 (ref 5–15)
BUN: 11 mg/dL (ref 6–20)
CHLORIDE: 100 mmol/L — AB (ref 101–111)
CO2: 23 mmol/L (ref 22–32)
Calcium: 8.6 mg/dL — ABNORMAL LOW (ref 8.9–10.3)
Creatinine, Ser: 0.66 mg/dL (ref 0.61–1.24)
GFR calc Af Amer: >60 mL/min (ref >60)
GLUCOSE: 217 mg/dL — AB (ref 65–99)
POTASSIUM: 4.8 mmol/L (ref 3.5–5.1)
Sodium: 131 mmol/L — ABNORMAL LOW (ref 135–145)

## 2015-08-02 LAB — GLUCOSE, CAPILLARY
GLUCOSE-CAPILLARY: 250 mg/dL — AB (ref 65–99)
Glucose-Capillary: 208 mg/dL — ABNORMAL HIGH (ref 65–99)
Glucose-Capillary: 161 mg/dL — ABNORMAL HIGH (ref 65–99)
Glucose-Capillary: 178 mg/dL — ABNORMAL HIGH (ref 65–99)

## 2015-08-02 LAB — CBC
HEMATOCRIT: 27.2 % — AB (ref 39.0–52.0)
HEMOGLOBIN: 8.3 g/dL — AB (ref 13.0–17.0)
MCH: 26.3 pg (ref 26.0–34.0)
MCHC: 30.5 g/dL (ref 30.0–36.0)
MCV: 86.1 fL (ref 78.0–100.0)
PLATELETS: 914 10*3/uL — AB (ref 150–400)
RBC: 3.16 MIL/uL — AB (ref 4.22–5.81)
RDW: 15.4 % (ref 11.5–15.5)
WBC: 16.5 10*3/uL — AB (ref 4.0–10.5)

## 2015-08-02 MED ORDER — FOSFOMYCIN TROMETHAMINE 3 G PO PACK
3.0000 g | PACK | Freq: Once | ORAL | Status: AC
  Administered 2015-08-02: 3 g via ORAL
  Filled 2015-08-02: qty 3

## 2015-08-02 NOTE — Progress Notes (Signed)
Nutrition Follow-up  DOCUMENTATION CODES:   Underweight, Severe malnutrition in context of chronic illness  INTERVENTION:    Continue MVI  Continue regular diet  Continue Vital AF 1.2 formula at 60 ml/hr from 6 PM to 6 AM. Total time is 12 hours.  TF regimen to provide 864 kcals, 54 gm protein, 584 ml of free water  NUTRITION DIAGNOSIS:   Inadequate oral intake related to chronic illness as evidenced by  (PO intake </= 50%).  Ongoing  GOAL:   Patient will meet greater than or equal to 90% of their needs  Progressing  MONITOR:   TF tolerance, PO intake, Labs, Weight trends, Skin, I & O's  REASON FOR ASSESSMENT:   Ventilator, Other (Comment) (New Tube Feeding)    ASSESSMENT:   [name] with PMH of spinal cord injury s/p paraplegia who was recently admitted for MRSA and microaerophilic strep bacteremia with respiratory failure requiring trach (vent at night); brought to ER after patient was found to have a hallucinations and confusion. Patient was recently tapered off his IV antibiotics and was placed on doxycycline and Cipro was started 3 days ago for possible UTI. In ER patient UA showed features concerning for UTI and patient has chronic suprapubic catheter. Patient has been admitted for possible developing sepsis.   RN changing wound dressing at time of visit.   Pt remains on room air.   Pt continues on a regular diet (per pt request). Intake remains good; PO: 95-100% per doc flowsheets.   Pt remains on nocturnal feeding regimen of Vital AF 1.2 via PEG (6 PM to 6 AM) providing 864 kcals, 54 gm protein, 584 ml of free water. Tolerating well.  ID and plastics following. Plan for I&D of sacral decubitus (due to prior distal sacrococcygeal destruction or resection and foci of gas within the LEFT gluteus maximus question infection, per ID) in OR on Thursday, 08/04/15.   RNCM following for discharge disposition.  Labs reviewed: Na: 131 (on IV supplementation),  CBGS: 151-250.   Diet Order:  Diet regular Room service appropriate?: Yes; Fluid consistency:: Thin  Skin:      Left plantar heel with stage 2 pressure injury Left plantar foot near heel with unstageable pressure injury Left anterior foot near small toe stage 2 pressure injury  Left outer foot stage 2 pressure injury  Right ischium with stage 4 pressure injury Left ischium with stage 4 pressure injury Left hip with stage 4 pressure injury Left posterior back with chronic full thickness wound  Last BM:  08/01/15  Height:   Ht Readings from Last 1 Encounters:  07/13/15 5\' 7"  (1.702 m)    Weight:   Wt Readings from Last 1 Encounters:  08/01/15 119 lb 7.8 oz (54.2 kg)    Ideal Body Weight:  70 kg  BMI:  Body mass index is 18.71 kg/(m^2).  Estimated Nutritional Needs:   Kcal:  1700-1900  Protein:  90-100 gm  Fluid:  1.7-1.9 L  EDUCATION NEEDS:   No education needs identified at this time  [name] [name], RD, LDN, CDE Pager: [phone] After hours Pager: [phone]

## 2015-08-02 NOTE — Progress Notes (Signed)
[place] for Infectious Disease    Subjective:   No new complaints  Antibiotics:  Anti-infectives    Start     Dose/Rate Route Frequency Ordered Stop   08/01/15 1000  fluconazole (DIFLUCAN) tablet 200 mg  Status:  Discontinued     200 mg Oral Daily 08/01/15 0839 08/01/15 1342   07/31/15 1500  vancomycin (VANCOCIN) IVPB 750 mg/150 ml premix  Status:  Discontinued     750 mg 150 mL/hr over 60 Minutes Intravenous Every 48 hours 07/31/15 1322 08/01/15 1342   07/27/15 1800  vancomycin (VANCOCIN) 500 mg in sodium chloride 0.9 % 100 mL IVPB     500 mg 100 mL/hr over 60 Minutes Intravenous Every 12 hours 07/27/15 0048 07/29/15 1846   07/23/15 2300  vancomycin (VANCOCIN) IVPB 750 mg/150 ml premix  Status:  Discontinued     750 mg 150 mL/hr over 60 Minutes Intravenous Every 24 hours 07/23/15 2215 07/27/15 0048   07/18/15 0000  vancomycin (VANCOCIN) IVPB 750 mg/150 ml premix     750 mg 150 mL/hr over 60 Minutes Intravenous Every 24 hours 07/17/15 0508 07/19/15 0123   07/17/15 0100  vancomycin (VANCOCIN) 500 mg in sodium chloride 0.9 % 100 mL IVPB  Status:  Discontinued     500 mg 100 mL/hr over 60 Minutes Intravenous Every 24 hours 07/16/15 1016 07/17/15 0507   07/13/15 2000  imipenem-cilastatin (PRIMAXIN) 250 mg in sodium chloride 0.9 % 100 mL IVPB     250 mg 200 mL/hr over 30 Minutes Intravenous 3 times per day 07/13/15 1046 07/19/15 2256   07/11/15 2200  imipenem-cilastatin (PRIMAXIN) 250 mg in sodium chloride 0.9 % 100 mL IVPB  Status:  Discontinued     250 mg 200 mL/hr over 30 Minutes Intravenous Every 12 hours 07/11/15 1020 07/13/15 1046   07/10/15 2230  imipenem-cilastatin (PRIMAXIN) 250 mg in sodium chloride 0.9 % 100 mL IVPB  Status:  Discontinued     250 mg 200 mL/hr over 30 Minutes Intravenous 3 times per day 07/10/15 2226 07/11/15 1020   07/10/15 2230  vancomycin (VANCOCIN) 500 mg in sodium chloride 0.9 % 100 mL IVPB  Status:  Discontinued     500  mg 100 mL/hr over 60 Minutes Intravenous Every 24 hours 07/10/15 2226 07/16/15 1016      Medications: Scheduled Meds: . baclofen  10 mg Oral 2 times per day  . baclofen  20 mg Oral QHS  . collagenase   Topical Daily  . enoxaparin (LOVENOX) injection  40 mg Subcutaneous Daily  . feeding supplement (VITAL AF 1.2 CAL)  1,000 mL Per Tube Q24H  . fentaNYL  50 mcg Transdermal Q72H  . Influenza vac split quadrivalent PF  0.5 mL Intramuscular Tomorrow-1000  . insulin aspart  0-15 Units Subcutaneous TID WC  . insulin aspart  0-5 Units Subcutaneous QHS  . insulin glargine  20 Units Subcutaneous Daily  . midodrine  30 mg Oral TID WC  . multivitamin with minerals  1 tablet Oral Daily  . pantoprazole sodium  40 mg Oral Daily  . pregabalin  100 mg Oral 3 times per day  . pregabalin  200 mg Oral QHS   Continuous Infusions: . sodium chloride 10 mL/hr at 07/29/15 2139   PRN Meds:.acetaminophen **OR** acetaminophen, albuterol, LORazepam, mirtazapine, ondansetron **OR** ondansetron (ZOFRAN) IV, oxyCODONE, RESOURCE THICKENUP CLEAR    Objective: Weight change:   Intake/Output Summary (Last 24 hours) at  08/02/15 1213 Last data filed at 08/02/15 1141  Gross per 24 hour  Intake    270 ml  Output   4000 ml  Net  -3730 ml   Blood pressure 103/60, pulse 93, temperature 100.3 F (37.9 C), temperature source Oral, resp. rate 20, height 5\' 7"  (1.702 m), weight 119 lb 7.8 oz (54.2 kg), SpO2 99 %. Temp:  [99.5 F (37.5 C)-101.8 F (38.8 C)] 100.3 F (37.9 C) (10/25 0900) Pulse Rate:  [86-107] 93 (10/25 0609) Resp:  [18-20] 20 (10/25 0609) BP: (103-142)/(60-87) 103/60 mmHg (10/25 0609) SpO2:  [95 %-99 %] 99 % (10/25 0609) FiO2 (%):  [21 %] 21 % (10/25 0453)  Physical Exam: General: Alert and awake, oriented CV: rrr no mgr Lungs:better aiway movement today throughout  SKin not examined today  Urine seems clearer in bag  07/27/15:      Neuro: no new deficits  CBC: CBC Latest Ref  Rng 08/02/2015 07/30/2015 07/29/2015  WBC 4.0 - 10.5 K/uL 16.5(H) 16.6(H) 17.7(H)  Hemoglobin 13.0 - 17.0 g/dL 8.3(L) 9.7(L) 9.2(L)  Hematocrit 39.0 - 52.0 % 27.2(L) 30.6(L) 30.3(L)  Platelets 150 - 400 K/uL 914(HH) 747(H) 819(H)       BMET  Recent Labs  08/01/15 0541 08/02/15 0015  NA 134* 131*  K 4.8 4.8  CL 103 100*  CO2 23 23  GLUCOSE 159* 217*  BUN 16 11  CREATININE 0.76 0.66  CALCIUM 8.8* 8.6*     Liver Panel  No results for input(s): PROT, ALBUMIN, AST, ALT, ALKPHOS, BILITOT, BILIDIR, IBILI in the last 72 hours.     Sedimentation Rate No results for input(s): ESRSEDRATE in the last 72 hours. C-Reactive Protein No results for input(s): CRP in the last 72 hours.  Micro Results: Recent Results (from the past 720 hour(s))  Blood culture (routine x 2)     Status: None   Collection Time: 07/10/15  4:25 PM  Result Value Ref Range Status   Specimen Description BLOOD LEFT ARM  Final   Special Requests BOTTLES DRAWN AEROBIC AND ANAEROBIC 5CC  Final   Culture NO GROWTH 5 DAYS  Final   Report Status 07/15/2015 FINAL  Final  MRSA PCR Screening     Status: None   Collection Time: 07/11/15  1:00 AM  Result Value Ref Range Status   MRSA by PCR NEGATIVE NEGATIVE Final    Comment:        The GeneXpert MRSA Assay (FDA approved for NASAL specimens only), is one component of a comprehensive MRSA colonization surveillance program. It is not intended to diagnose MRSA infection nor to guide or monitor treatment for MRSA infections.   Blood culture (routine x 2)     Status: None   Collection Time: 07/11/15  4:19 AM  Result Value Ref Range Status   Specimen Description BLOOD RIGHT FOREARM  Final   Special Requests BOTTLES DRAWN AEROBIC ONLY 2CC  Final   Culture NO GROWTH 5 DAYS  Final   Report Status 07/16/2015 FINAL  Final  Urine culture     Status: None   Collection Time: 07/11/15  5:09 PM  Result Value Ref Range Status   Specimen Description URINE, RANDOM   Final   Special Requests NONE  Final   Culture   Final    >=100,000 COLONIES/mL PSEUDOMONAS AERUGINOSA AMIKACIN MIC=8 SENSITIVE COLISTIN MIC=1 SENSITIVE >=100,000 COLONIES/mL YEAST    Report Status 07/27/2015 FINAL  Final   Organism ID, Bacteria PSEUDOMONAS AERUGINOSA  Final  Susceptibility   Pseudomonas aeruginosa - MIC*    CEFTAZIDIME 8 SENSITIVE Sensitive     CIPROFLOXACIN >=4 RESISTANT Resistant     GENTAMICIN >=16 RESISTANT Resistant     IMIPENEM >=16 RESISTANT Resistant     CEFEPIME 16 INTERMEDIATE Intermediate     AZTREONAM Value in next row       >=16RESISTANT    * >=100,000 COLONIES/mL PSEUDOMONAS AERUGINOSA  Culture, blood (routine x 2)     Status: None   Collection Time: 07/23/15 10:35 PM  Result Value Ref Range Status   Specimen Description BLOOD RIGHT ARM  Final   Special Requests IN PEDIATRIC BOTTLE 3CC  Final   Culture NO GROWTH 5 DAYS  Final   Report Status 07/28/2015 FINAL  Final  Culture, blood (routine x 2)     Status: None   Collection Time: 07/23/15 10:45 PM  Result Value Ref Range Status   Specimen Description BLOOD RIGHT ANTECUBITAL  Final   Special Requests BOTTLES DRAWN AEROBIC AND ANAEROBIC 5CC   Final   Culture NO GROWTH 5 DAYS  Final   Report Status 07/28/2015 FINAL  Final  Culture, blood (routine x 2)     Status: None   Collection Time: 07/27/15  7:05 AM  Result Value Ref Range Status   Specimen Description BLOOD RIGHT HAND  Final   Special Requests BOTTLES DRAWN AEROBIC ONLY 3CC  Final   Culture NO GROWTH 5 DAYS  Final   Report Status 08/01/2015 FINAL  Final  Culture, blood (routine x 2)     Status: None   Collection Time: 07/27/15  7:10 AM  Result Value Ref Range Status   Specimen Description BLOOD RIGHT ARM  Final   Special Requests BOTTLES DRAWN AEROBIC ONLY 3CC  Final   Culture NO GROWTH 5 DAYS  Final   Report Status 08/01/2015 FINAL  Final  Culture, Urine     Status: None (Preliminary result)   Collection Time: 07/29/15  6:06 PM    Result Value Ref Range Status   Specimen Description URINE, SUPRAPUBIC  Final   Special Requests Normal  Final   Culture   Final    >=100,000 COLONIES/mL PSEUDOMONAS AERUGINOSA >=100,000 COLONIES/mL YEAST    Report Status PENDING  Incomplete    Studies/Results: No results found.    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 Urology saw pt and it is believed that suprapubic was already changed after culture with yeast was obtained  Principal Problem:   Acute encephalopathy Active Problems:   Palliative care encounter   Chronic anemia   Uncontrolled type 1 diabetes mellitus with hyperglycemia ([name])   Altered mental status   Tracheostomy dependent ([name])   H/O spinal cord injury   Chronic paraplegia ([name])   Acute on chronic respiratory failure ([name])   Hypomagnesemia   Decubitus ulcer of right perineal ischial region, stage 4 ([place])   Decubitus ulcer of left perineal ischial region, stage 4 ([place])   Decubitus ulcer of sacral region, stage 4 ([name])   Complicated urinary tract infection   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  Looks to be NEW and due to infection progressing  Greatly appreciate  Dr. Eusebio Friendly help to take him to the OR for debridement of  this area and it will help [name] in ID if we have [place], TISSUE to help guide therapy esp give his hx of MDRO  We are holding vancomycin and other systemic antibiotics to  increase yield on cultures on Thursday  #2 Recurrent pseudmonas in urine that his MDR: trying fosfomycin and sending for other S testing  #3 Funguria: changing the catheter should eradicate this problem  #4 Pleural effusions: chronic , if he does nto improve after we target his osteomyelitis could consider tapping effusion for cell coutn diff, ldh and cutlures            LOS: 23 days   [name] 08/02/2015, 12:13 PM

## 2015-08-02 NOTE — Care Management Note (Signed)
Case Management Note  Patient Details  Name: ANOTHY BUFANO MRN: [id number] Date of Birth: Mar 16, 1986  Subjective/Objective:                    Action/Plan:  UR updated , awaiting surgery Thursday  Expected Discharge Date:                  Expected Discharge Plan:  [place]  In-House Referral:     Discharge planning Services  CM Consult  Post Acute Care Choice:    Choice offered to:     DME Arranged:    DME Agency:     HH Arranged:  RN [place] Agency:  [facility]  Status of Service:  In process, will continue to follow  Medicare Important Message Given:    Date Medicare IM Given:    Medicare IM give by:    Date Additional Medicare IM Given:    Additional Medicare Important Message give by:     If discussed at [place] of Stay Meetings, dates discussed:    Additional Comments:  Marilu Favre, RN 08/02/2015, 10:19 AM

## 2015-08-02 NOTE — Progress Notes (Signed)
CRITICAL VALUE ALERT  Critical value received:  Platelets 914  Date of notification:  08/02/2015   Time of notification:  0139  Critical value read back:Yes.    Nurse who received alert:  Rhae Hammock  MD notified (1st page):  Raliegh Ip Schorr  Time of first page:  0145  MD notified (2nd page): K. Schorr  Time of second page: 0500  Responding MD:  Time MD responded:

## 2015-08-02 NOTE — Progress Notes (Signed)
PROGRESS NOTE    Jason [name] [id number] DOB: [birth year] DOA: 07/10/2015 PCP: [name], MD  HPI/Brief narrative 29 y.o. 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 Patient was recently tapered off his IV antibiotics and was placed on doxycycline and Cipro 3 days prior to this admit for possible UTI.   In the ER patient UA shows features concerning for UTI - patient has chronic suprapubic catheter. Patient was also found to be hypothermic and hypotensive. Patient has had previous history of multiple debridement for chronic sacral decubitus which at this time looks to be healing. Patient also has history of chronic osteomyelitis.    Assessment/Plan:  Toxic metabolic encephalopathy Resolved - patient is presently alert and oriented  Sepsis due to MDR Pseudomonas Aeruginosa UTI ?  Vs decubitus/osteomyelitis - suprapubic catheter changed - ID and plastics following: CT Scan showed: Sacral decubitus ulcer with prior distal sacrococcygeal destruction or resection and foci of gas within the LEFT gluteus maximus question infection. - Dr. Migdalia Dk. She is unable to get patient on the schedule until Thursday . Still spiking fevers, but trending douwn. Medicaid has no LTAC coverage. Infectious disease ordered urine culture and resumed fosfomycin-- growing yeast and pseudomonas   Acute renal failure Due to sepsis - renal function has normalized  Hyponatremia  Mild, monitor  Hypomagnesemia  Replaced to goal of 2.0  Diabetes mellitus type 1 with hyperglycemia Improved on higher lantus dose  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 Plastics/ID consulted  Chronic anemia Hgb initially dropped w/ volume expansion  - s/p 2U PRBC - no evidence of acute blood loss - hemoglobin appears stable at this time   Chronic respiratory failure with trach and vent pulmonary critical care supposed to be managing vent - it appears the patient took himself off the vent 10/7 and has not been back on since - currently appears stable on 28% trach collar  - As per PCCM ELink note 10/3, it appears that patient is followed in OP trach clinic> will defer further trach decisions to OP follow up in that clinic. - Discussed with CCM M.D. on call 10/16: Agrees with outpatient follow-up with trach clinic and indicates that he may not be decannulated.   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 IVC filter placed 8/6  History of spinal canal injury with paraplegia  Severe malnutrition in context of chronic illness, Underweight On night time TF  DVT Prophylaxis: Lovenox Code Status: Full Family Communication: no family at bedside Disposition Plan:    Consultants:  PCCM   Infectious disease  Plastics- [facility]  Procedures:  None  Antibiotics:  Imipenem 10/2 > 10/11  Vanc 10/2 > 10/11  Fosfomycin 10/6 >   Vancomycin 10/15 >   Subjective: No complaints  Objective: Filed Vitals:   08/02/15 0453 08/02/15 0609 08/02/15 0900 08/02/15 1237  BP:  103/60    Pulse:  93  90  Temp:  101.8 F (38.8 C) 100.3 F (37.9 C)   TempSrc:  Oral    Resp:  20  18  Height:      Weight:      SpO2: 97% 99%  98%    Intake/Output Summary (Last 24 hours) at 08/02/15 1428 Last data filed at 08/02/15 1215  Gross per 24 hour  Intake    360 ml  Output   4000 ml  Net  -3640 ml   Filed Weights   07/30/15 0500 07/31/15 0424 08/01/15 0424  Weight: 56.609 kg (124 lb 12.8 oz) 57.38 kg (126 lb 8 oz) 54.2 kg (119 lb 7.8 oz)     Exam:  General exam: Comfortable. Nontoxic appearing.  smiling Respiratory system: Clear. No increased work of breathing. Cardiovascular system: S1 & S2 heard, RRR. No JVD, murmurs, gallops, clicks or pedal  edema. Gastrointestinal system: Abdomen is nondistended, soft and nontender. Normal bowel sounds heard.- ostomy functioning Extremities: Edema noted  Data Reviewed: Basic Metabolic Panel:  Recent Labs Lab 07/27/15 0055 07/29/15 0945 07/30/15 0435 08/01/15 0541 08/02/15 0015  NA 131* 129* 134* 134* 131*  K 4.4 4.4 5.1 4.8 4.8  CL 97* 98* 103 103 100*  CO2 24 21* 21* 23 23  GLUCOSE 198* 165* 160* 159* 217*  BUN 17 21* 24* 16 11  CREATININE 0.83 0.87 1.03 0.76 0.66  CALCIUM 8.8* 8.5* 8.9 8.8* 8.6*   Liver Function Tests: No results for input(s): AST, ALT, ALKPHOS, BILITOT, PROT, ALBUMIN in the last 168 hours. No results for input(s): LIPASE, AMYLASE in the last 168 hours. No results for input(s): AMMONIA in the last 168 hours. CBC:  Recent Labs Lab 07/27/15 0055 07/29/15 0945 07/30/15 0435 08/02/15 0015  WBC 18.0* 17.7* 16.6* 16.5*  NEUTROABS  --   --  10.7*  --   HGB 10.0* 9.2* 9.7* 8.3*  HCT 30.1* 30.3* 30.6* 27.2*  MCV 83.8 87.6 86.7 86.1  PLT 572* 819* 747* 914*   Cardiac Enzymes: No results for input(s): CKTOTAL, CKMB, CKMBINDEX, TROPONINI in the last 168 hours. BNP (last 3 results) No results for input(s): PROBNP in the last 8760 hours. CBG:  Recent Labs Lab 08/01/15 1203 08/01/15 1709 08/01/15 2135 08/02/15 0825 08/02/15 1218  GLUCAP 214* 180* 151* 250* 208*    Recent Results (from the past 240 hour(s))  Culture, blood (routine x 2)     Status: None   Collection Time: 07/23/15 10:35 PM  Result Value Ref Range Status   Specimen Description BLOOD RIGHT ARM  Final   Special Requests IN PEDIATRIC BOTTLE 3CC  Final   Culture NO GROWTH 5 DAYS  Final   Report Status 07/28/2015 FINAL  Final  Culture, blood (routine x 2)     Status: None   Collection Time: 07/23/15 10:45 PM  Result Value Ref Range Status   Specimen Description BLOOD RIGHT ANTECUBITAL  Final   Special Requests BOTTLES DRAWN AEROBIC AND ANAEROBIC 5CC   Final   Culture NO GROWTH 5 DAYS   Final   Report Status 07/28/2015 FINAL  Final  Culture, blood (routine x 2)     Status: None   Collection Time: 07/27/15  7:05 AM  Result Value Ref Range Status   Specimen Description BLOOD RIGHT HAND  Final   Special Requests BOTTLES DRAWN AEROBIC ONLY 3CC  Final   Culture NO GROWTH 5 DAYS  Final   Report Status 08/01/2015 FINAL  Final  Culture, blood (routine x 2)     Status: None   Collection Time: 07/27/15  7:10 AM  Result Value Ref Range Status   Specimen Description BLOOD RIGHT ARM  Final   Special Requests BOTTLES DRAWN AEROBIC ONLY 3CC  Final   Culture NO GROWTH 5 DAYS  Final   Report Status 08/01/2015 FINAL  Final  Culture, Urine     Status: None (Preliminary  result)   Collection Time: 07/29/15  6:06 PM  Result Value Ref Range Status   Specimen Description URINE, SUPRAPUBIC  Final   Special Requests Normal  Final   Culture   Final    >=100,000 COLONIES/mL PSEUDOMONAS AERUGINOSA >=100,000 COLONIES/mL YEAST    Report Status PENDING  Incomplete           Studies: No results found.      Scheduled Meds: . baclofen  10 mg Oral 2 times per day  . baclofen  20 mg Oral QHS  . collagenase   Topical Daily  . enoxaparin (LOVENOX) injection  40 mg Subcutaneous Daily  . feeding supplement (VITAL AF 1.2 CAL)  1,000 mL Per Tube Q24H  . fentaNYL  50 mcg Transdermal Q72H  . fosfomycin  3 g Oral Once  . Influenza vac split quadrivalent PF  0.5 mL Intramuscular Tomorrow-1000  . insulin aspart  0-15 Units Subcutaneous TID WC  . insulin aspart  0-5 Units Subcutaneous QHS  . insulin glargine  20 Units Subcutaneous Daily  . midodrine  30 mg Oral TID WC  . multivitamin with minerals  1 tablet Oral Daily  . pantoprazole sodium  40 mg Oral Daily  . pregabalin  100 mg Oral 3 times per day  . pregabalin  200 mg Oral QHS   Continuous Infusions: . 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  Eulogio Bear, DO [facility]  [email or web address] Password [email or web address] 08/02/2015, 2:28 PM    LOS: 23 days

## 2015-08-02 NOTE — Progress Notes (Deleted)
Notified MD K. Schorr via text page 2 times about critical lab value for [facility]. No reply back.

## 2015-08-03 ENCOUNTER — Ambulatory Visit: Payer: Self-pay | Admitting: General Surgery

## 2015-08-03 LAB — GLUCOSE, CAPILLARY
GLUCOSE-CAPILLARY: 227 mg/dL — AB (ref 65–99)
Glucose-Capillary: 121 mg/dL — ABNORMAL HIGH (ref 65–99)
Glucose-Capillary: 130 mg/dL — ABNORMAL HIGH (ref 65–99)
Glucose-Capillary: 227 mg/dL — ABNORMAL HIGH (ref 65–99)
Glucose-Capillary: 316 mg/dL — ABNORMAL HIGH (ref 65–99)
Glucose-Capillary: 345 mg/dL — ABNORMAL HIGH (ref 65–99)
Glucose-Capillary: 386 mg/dL — ABNORMAL HIGH (ref 65–99)

## 2015-08-03 NOTE — Progress Notes (Signed)
PROGRESS NOTE    Jason [name] VHQ:[id number] DOB: [birth year] DOA: 07/10/2015 PCP: [name], MD  HPI/Brief narrative 29 y.o. 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 Patient was recently tapered off his IV antibiotics and was placed on doxycycline and Cipro 3 days prior to this admit for possible UTI.   In the ER patient UA shows features concerning for UTI - patient has chronic suprapubic catheter. Patient was also found to be hypothermic and hypotensive. Patient has had previous history of multiple debridement for chronic sacral decubitus which at this time looks to be healing. Patient also has history of chronic osteomyelitis.    Assessment/Plan:  Toxic metabolic encephalopathy Resolved - patient is presently alert and oriented  Sepsis due to MDR Pseudomonas Aeruginosa UTI ?  Vs decubitus/osteomyelitis - suprapubic catheter changed - ID and plastics following: CT Scan showed: Sacral decubitus ulcer with prior distal sacrococcygeal destruction or resection and foci of gas within the LEFT gluteus maximus question infection. - Dr. Migdalia Dk. She is unable to get patient on the schedule until Thursday . Still spiking fevers, but trending douwn. Medicaid has no LTAC coverage. Infectious disease ordered urine culture and resumed fosfomycin-- growing yeast and pseudomonas   Acute renal failure Due to sepsis - renal function has normalized  Hyponatremia  Mild, monitor  Hypomagnesemia  Replaced to goal of 2.0  Diabetes mellitus type 1 with hyperglycemia Improved on higher lantus dose  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 Plastics/ID consulted  Chronic anemia Hgb initially dropped w/ volume expansion  - s/p 2U PRBC - no evidence of acute blood loss - hemoglobin appears stable at this time   Chronic respiratory failure with trach and vent pulmonary critical care supposed to be managing vent - it appears the patient took himself off the vent 10/7 and has not been back on since - currently appears stable on 28% trach collar  - As per PCCM ELink note 10/3, it appears that patient is followed in OP trach clinic> will defer further trach decisions to OP follow up in that clinic. - Discussed with CCM M.D. on call 10/16: Agrees with outpatient follow-up with trach clinic and indicates that he may not be decannulated.   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 IVC filter placed 8/6  History of spinal canal injury with paraplegia  Severe malnutrition in context of chronic illness, Underweight On night time TF  DVT Prophylaxis: Lovenox Code Status: Full Family Communication: no family at bedside Disposition Plan:    Consultants:  PCCM   Infectious disease  Plastics- [facility]  Procedures:  None  Antibiotics:  Imipenem 10/2 > 10/11  Vanc 10/2 > 10/11  Fosfomycin 10/6 >   Vancomycin 10/15 >   Subjective: "feels weird" today  Objective: Filed Vitals:   08/03/15 0307 08/03/15 0440 08/03/15 0500 08/03/15 1201  BP:  102/66    Pulse: 86 92  92  Temp:  99.7 F (37.6 C)    TempSrc:  Oral    Resp: 18 18  18   Height:      Weight:   53 kg (116 lb 13.5 oz)   SpO2: 99% 95%  97%    Intake/Output Summary (Last 24 hours) at 08/03/15 1418 Last data filed at 08/03/15 1208  Gross per  24 hour  Intake 7707.83 ml  Output   3300 ml  Net 4407.83 ml   Filed Weights   07/31/15 0424 08/01/15 0424 08/03/15 0500  Weight: 57.38 kg (126 lb 8 oz) 54.2 kg (119 lb 7.8 oz) 53 kg (116 lb 13.5 oz)     Exam:  General exam: Comfortable. Nontoxic appearing.  smiling Respiratory system: Clear. No increased work of breathing. Cardiovascular system: S1 & S2 heard, RRR. No JVD, murmurs, gallops, clicks or  pedal edema. Gastrointestinal system: Abdomen is nondistended, soft and nontender. Normal bowel sounds heard.- ostomy functioning Extremities: Edema noted  Data Reviewed: Basic Metabolic Panel:  Recent Labs Lab 07/29/15 0945 07/30/15 0435 08/01/15 0541 08/02/15 0015  NA 129* 134* 134* 131*  K 4.4 5.1 4.8 4.8  CL 98* 103 103 100*  CO2 21* 21* 23 23  GLUCOSE 165* 160* 159* 217*  BUN 21* 24* 16 11  CREATININE 0.87 1.03 0.76 0.66  CALCIUM 8.5* 8.9 8.8* 8.6*   Liver Function Tests: No results for input(s): AST, ALT, ALKPHOS, BILITOT, PROT, ALBUMIN in the last 168 hours. No results for input(s): LIPASE, AMYLASE in the last 168 hours. No results for input(s): AMMONIA in the last 168 hours. CBC:  Recent Labs Lab 07/29/15 0945 07/30/15 0435 08/02/15 0015  WBC 17.7* 16.6* 16.5*  NEUTROABS  --  10.7*  --   HGB 9.2* 9.7* 8.3*  HCT 30.3* 30.6* 27.2*  MCV 87.6 86.7 86.1  PLT 819* 747* 914*   Cardiac Enzymes: No results for input(s): CKTOTAL, CKMB, CKMBINDEX, TROPONINI in the last 168 hours. BNP (last 3 results) No results for input(s): PROBNP in the last 8760 hours. CBG:  Recent Labs Lab 08/03/15 0018 08/03/15 0440 08/03/15 0610 08/03/15 0733 08/03/15 1201  GLUCAP 227* 316* 345* 386* 227*    Recent Results (from the past 240 hour(s))  Culture, blood (routine x 2)     Status: None   Collection Time: 07/27/15  7:05 AM  Result Value Ref Range Status   Specimen Description BLOOD RIGHT HAND  Final   Special Requests BOTTLES DRAWN AEROBIC ONLY 3CC  Final   Culture NO GROWTH 5 DAYS  Final   Report Status 08/01/2015 FINAL  Final  Culture, blood (routine x 2)     Status: None   Collection Time: 07/27/15  7:10 AM  Result Value Ref Range Status   Specimen Description BLOOD RIGHT ARM  Final   Special Requests BOTTLES DRAWN AEROBIC ONLY 3CC  Final   Culture NO GROWTH 5 DAYS  Final   Report Status 08/01/2015 FINAL  Final  Culture, Urine     Status: None (Preliminary result)    Collection Time: 07/29/15  6:06 PM  Result Value Ref Range Status   Specimen Description URINE, SUPRAPUBIC  Final   Special Requests Normal  Final   Culture   Final    >=100,000 COLONIES/mL PSEUDOMONAS AERUGINOSA AMIKACIN MIC=8 SENSITIVE >=100,000 COLONIES/mL YEAST    Report Status PENDING  Incomplete   Organism ID, Bacteria PSEUDOMONAS AERUGINOSA  Final      Susceptibility   Pseudomonas aeruginosa - MIC*    CEFTAZIDIME 16 INTERMEDIATE Intermediate     CIPROFLOXACIN >=4 RESISTANT Resistant     GENTAMICIN >=16 RESISTANT Resistant     IMIPENEM >=16 RESISTANT Resistant     CEFEPIME 16 INTERMEDIATE Intermediate     * >=100,000 COLONIES/mL PSEUDOMONAS AERUGINOSA           Studies: No results found.      Scheduled  Meds: . baclofen  10 mg Oral 2 times per day  . baclofen  20 mg Oral QHS  . collagenase   Topical Daily  . enoxaparin (LOVENOX) injection  40 mg Subcutaneous Daily  . feeding supplement (VITAL AF 1.2 CAL)  1,000 mL Per Tube Q24H  . fentaNYL  50 mcg Transdermal Q72H  . Influenza vac split quadrivalent PF  0.5 mL Intramuscular Tomorrow-1000  . insulin aspart  0-15 Units Subcutaneous TID WC  . insulin aspart  0-5 Units Subcutaneous QHS  . insulin glargine  20 Units Subcutaneous Daily  . midodrine  30 mg Oral TID WC  . multivitamin with minerals  1 tablet Oral Daily  . pantoprazole sodium  40 mg Oral Daily  . pregabalin  100 mg Oral 3 times per day  . pregabalin  200 mg Oral QHS   Continuous Infusions: . 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  Eulogio Bear, DO [facility]  [email or web address] Password [email or web address] 08/03/2015, 2:18 PM    LOS: 24 days

## 2015-08-03 NOTE — Progress Notes (Signed)
Patient ID: Jason [name], male   DOB: Feb 29, 1986, 29 y.o.   MRN: [id number]   Plastic Surgery follow up;  Plan OR tomorrow afternoon for I&D of sacral and ischial ulcers with placement of Acell per Dr. Jeanell Sparrow Plastic Surgery [phone]

## 2015-08-03 NOTE — Progress Notes (Addendum)
Inpatient Diabetes Program Recommendations  AACE/ADA: New Consensus Statement on Inpatient Glycemic Control (2015)  Target Ranges:  Prepandial:   less than 140 mg/dL      Peak postprandial:   less than 180 mg/dL (1-2 hours)      Critically ill patients:  140 - 180 mg/dL    Results for KEYLON, LABELLE (MRN [id number]) as of 08/03/2015 09:58  Ref. Range 08/02/2015 08:25 08/02/2015 12:18 08/02/2015 17:38 08/02/2015 21:15  Glucose-Capillary Latest Ref Range: 65-99 mg/dL 250 (H) 208 (H) 161 (H) 178 (H)    Results for FERGUS, THRONE (MRN [id number]) as of 08/03/2015 09:58  Ref. Range 08/03/2015 07:33  Glucose-Capillary Latest Ref Range: 65-99 mg/dL 386 (H)    Current Insulin Orders: Lantus 20 units daily     Novolog Moderate SSI (0-15 units) TID AC + HS     -Note patient receives Nocturnal tube feedings and Regular PO diet tray during the day.  -Having occasional glucose elevations.  -Fasting glucose this AM was 386 mg/dl.    MD- Please consider the following in-hospital insulin adjustments:  1. Increase Lantus slightly to 24 units daily (20% increase)  2. Change Novolog SSI back to Q4 hour coverage (changed to TID AC + HS the other day, however, patient likely needs coverage at night to help cover the carbohydrates in his tube feedings)  3. Change PO diet to Carbohydrate Modified diet     --Will follow patient during hospitalization--  Wyn Quaker RN, MSN, CDE Diabetes Coordinator Inpatient Glycemic Control Team Team Pager: [phone] (8a-5p)

## 2015-08-04 ENCOUNTER — Inpatient Hospital Stay (HOSPITAL_COMMUNITY): Payer: Medicaid Other | Admitting: Anesthesiology

## 2015-08-04 ENCOUNTER — Other Ambulatory Visit: Payer: Self-pay | Admitting: Family Medicine

## 2015-08-04 ENCOUNTER — Encounter (HOSPITAL_COMMUNITY)
Admission: EM | Disposition: A | Discharge status: Home Health | Payer: Self-pay | Source: Home / Self Care | Attending: Internal Medicine

## 2015-08-04 ENCOUNTER — Encounter (HOSPITAL_COMMUNITY): Payer: Self-pay | Admitting: Plastic Surgery

## 2015-08-04 HISTORY — PX: INCISION AND DRAINAGE OF WOUND: SHX1803

## 2015-08-04 HISTORY — PX: APPLICATION OF A-CELL OF CHEST/ABDOMEN: SHX6302

## 2015-08-04 LAB — GLUCOSE, CAPILLARY
GLUCOSE-CAPILLARY: 59 mg/dL — AB (ref 65–99)
GLUCOSE-CAPILLARY: 98 mg/dL (ref 65–99)
GLUCOSE-CAPILLARY: 48 mg/dL — AB (ref 65–99)
Glucose-Capillary: 106 mg/dL — ABNORMAL HIGH (ref 65–99)
Glucose-Capillary: 58 mg/dL — ABNORMAL LOW (ref 65–99)
Glucose-Capillary: 68 mg/dL (ref 65–99)
Glucose-Capillary: 99 mg/dL (ref 65–99)
Glucose-Capillary: 202 mg/dL — ABNORMAL HIGH (ref 65–99)

## 2015-08-04 LAB — CBC
HCT: 26.1 % — ABNORMAL LOW (ref 39.0–52.0)
HEMOGLOBIN: 8 g/dL — AB (ref 13.0–17.0)
MCH: 26.2 pg (ref 26.0–34.0)
MCHC: 30.7 g/dL (ref 30.0–36.0)
MCV: 85.6 fL (ref 78.0–100.0)
Platelets: 844 10*3/uL — ABNORMAL HIGH (ref 150–400)
RBC: 3.05 MIL/uL — AB (ref 4.22–5.81)
RDW: 15.3 % (ref 11.5–15.5)
WBC: 15.6 10*3/uL — ABNORMAL HIGH (ref 4.0–10.5)

## 2015-08-04 LAB — BASIC METABOLIC PANEL
Anion gap: 8 (ref 5–15)
BUN: 12 mg/dL (ref 6–20)
CHLORIDE: 95 mmol/L — AB (ref 101–111)
CO2: 26 mmol/L (ref 22–32)
CREATININE: 0.58 mg/dL — AB (ref 0.61–1.24)
Calcium: 8.5 mg/dL — ABNORMAL LOW (ref 8.9–10.3)
GFR calc Af Amer: >60 mL/min (ref >60)
GFR calc non Af Amer: >60 mL/min (ref >60)
GLUCOSE: 123 mg/dL — AB (ref 65–99)
POTASSIUM: 4.5 mmol/L (ref 3.5–5.1)
Sodium: 129 mmol/L — ABNORMAL LOW (ref 135–145)

## 2015-08-04 SURGERY — IRRIGATION AND DEBRIDEMENT WOUND
Anesthesia: General | Site: Back

## 2015-08-04 MED ORDER — MIDAZOLAM HCL 5 MG/5ML IJ SOLN
INTRAMUSCULAR | Status: DC | PRN
  Administered 2015-08-04: 2 mg via INTRAVENOUS

## 2015-08-04 MED ORDER — DEXTROSE 50 % IV SOLN
25.0000 mL | Freq: Once | INTRAVENOUS | Status: AC
  Administered 2015-08-04: 25 mL via INTRAVENOUS

## 2015-08-04 MED ORDER — DEXTROSE 50 % IV SOLN
INTRAVENOUS | Status: DC | PRN
  Administered 2015-08-04: 12.5 g via INTRAVENOUS

## 2015-08-04 MED ORDER — MEPERIDINE HCL 25 MG/ML IJ SOLN: 6.2500 mg | INTRAMUSCULAR | Status: DC | PRN

## 2015-08-04 MED ORDER — DEXTROSE 50 % IV SOLN
INTRAVENOUS | Status: AC
  Administered 2015-08-04: 25 mL
  Filled 2015-08-04: qty 50

## 2015-08-04 MED ORDER — FENTANYL CITRATE (PF) 100 MCG/2ML IJ SOLN: 25.0000 ug | INTRAMUSCULAR | Status: DC | PRN

## 2015-08-04 MED ORDER — LACTATED RINGERS IV SOLN
INTRAVENOUS | Status: DC
  Administered 2015-08-04: 17:00:00 via INTRAVENOUS

## 2015-08-04 MED ORDER — FENTANYL CITRATE (PF) 250 MCG/5ML IJ SOLN
INTRAMUSCULAR | Status: AC
  Filled 2015-08-04: qty 5

## 2015-08-04 MED ORDER — LACTATED RINGERS IV SOLN: INTRAVENOUS | Status: DC

## 2015-08-04 MED ORDER — PROMETHAZINE HCL 25 MG/ML IJ SOLN: 6.2500 mg | INTRAMUSCULAR | Status: DC | PRN

## 2015-08-04 MED ORDER — MIDAZOLAM HCL 2 MG/2ML IJ SOLN
INTRAMUSCULAR | Status: AC
  Filled 2015-08-04: qty 4

## 2015-08-04 MED ORDER — SODIUM CHLORIDE 0.9 % IR SOLN
Status: DC | PRN
  Administered 2015-08-04: 1000 mL

## 2015-08-04 MED ORDER — DEXTROSE 50 % IV SOLN
INTRAVENOUS | Status: AC
Start: 2015-08-04 — End: 2015-08-04
  Administered 2015-08-04: 25 mL via INTRAVENOUS
  Filled 2015-08-04: qty 50

## 2015-08-04 MED ORDER — FENTANYL CITRATE (PF) 250 MCG/5ML IJ SOLN
INTRAMUSCULAR | Status: DC | PRN
  Administered 2015-08-04: 100 ug via INTRAVENOUS

## 2015-08-04 MED ORDER — HEMOSTATIC AGENTS (NO CHARGE) OPTIME
TOPICAL | Status: DC | PRN
  Administered 2015-08-04 (×2): 1 via TOPICAL

## 2015-08-04 SURGICAL SUPPLY — 54 items
BAG DECANTER FOR FLEXI CONT (MISCELLANEOUS) IMPLANT
BENZOIN TINCTURE PRP APPL 2/3 (GAUZE/BANDAGES/DRESSINGS) ×3 IMPLANT
BLADE 10 SAFETY STRL DISP (BLADE) ×3 IMPLANT
CANISTER SUCTION 2500CC (MISCELLANEOUS) ×3 IMPLANT
CONT SPEC STER OR (MISCELLANEOUS) IMPLANT
COVER SURGICAL LIGHT HANDLE (MISCELLANEOUS) ×3 IMPLANT
DRAPE IMP U-DRAPE 54X76 (DRAPES) ×3 IMPLANT
DRAPE INCISE IOBAN 66X45 STRL (DRAPES) IMPLANT
DRAPE LAPAROSCOPIC ABDOMINAL (DRAPES) IMPLANT
DRAPE PED LAPAROTOMY (DRAPES) ×3 IMPLANT
DRAPE PROXIMA HALF (DRAPES) IMPLANT
DRSG ADAPTIC 3X8 NADH LF (GAUZE/BANDAGES/DRESSINGS) ×6 IMPLANT
DRSG EMULSION OIL 3X3 NADH (GAUZE/BANDAGES/DRESSINGS) IMPLANT
DRSG PAD ABDOMINAL 8X10 ST (GAUZE/BANDAGES/DRESSINGS) ×9 IMPLANT
DRSG VAC ATS LRG SENSATRAC (GAUZE/BANDAGES/DRESSINGS) IMPLANT
DRSG VAC ATS MED SENSATRAC (GAUZE/BANDAGES/DRESSINGS) IMPLANT
DRSG VAC ATS SM SENSATRAC (GAUZE/BANDAGES/DRESSINGS) IMPLANT
ELECT CAUTERY BLADE 6.4 (BLADE) ×3 IMPLANT
ELECT REM PT RETURN 9FT ADLT (ELECTROSURGICAL) ×3
ELECTRODE REM PT RTRN 9FT ADLT (ELECTROSURGICAL) ×1 IMPLANT
GAUZE SPONGE 4X4 12PLY STRL (GAUZE/BANDAGES/DRESSINGS) IMPLANT
GAUZE SPONGE 4X4 16PLY XRAY LF (GAUZE/BANDAGES/DRESSINGS) ×3 IMPLANT
GLOVE BIO SURGEON STRL SZ 6.5 (GLOVE) ×2 IMPLANT
GLOVE BIO SURGEONS STRL SZ 6.5 (GLOVE) ×1
GOWN STRL REUS W/ TWL LRG LVL3 (GOWN DISPOSABLE) ×3 IMPLANT
GOWN STRL REUS W/TWL LRG LVL3 (GOWN DISPOSABLE) ×6
KIT BASIN OR (CUSTOM PROCEDURE TRAY) ×3 IMPLANT
KIT ROOM TURNOVER OR (KITS) ×3 IMPLANT
MATRIX SURGICAL PSM 5X5CM (Tissue) ×3 IMPLANT
MICROMATRIX 1000MG (Tissue) ×3 IMPLANT
MICROMATRIX 500MG (Tissue) ×9 IMPLANT
NS IRRIG 1000ML POUR BTL (IV SOLUTION) ×3 IMPLANT
PACK GENERAL/GYN (CUSTOM PROCEDURE TRAY) ×3 IMPLANT
PACK SURGICAL SETUP 50X90 (CUSTOM PROCEDURE TRAY) ×3 IMPLANT
PACK UNIVERSAL I (CUSTOM PROCEDURE TRAY) ×3 IMPLANT
PAD ARMBOARD 7.5X6 YLW CONV (MISCELLANEOUS) ×6 IMPLANT
PENCIL BUTTON HOLSTER BLD 10FT (ELECTRODE) ×3 IMPLANT
SOLUTION PARTIC MCRMTRX 1000MG (Tissue) ×1 IMPLANT
SOLUTION PARTIC MCRMTRX 500MG (Tissue) ×3 IMPLANT
SPONGE GAUZE 4X4 12PLY STER LF (GAUZE/BANDAGES/DRESSINGS) ×9 IMPLANT
STAPLER VISISTAT 35W (STAPLE) ×3 IMPLANT
SURGILUBE 2OZ TUBE FLIPTOP (MISCELLANEOUS) IMPLANT
SUT CHROMIC 4 0 P 3 18 (SUTURE) IMPLANT
SUT ETHILON 4 0 PS 2 18 (SUTURE) IMPLANT
SUT ETHILON 5 0 P 3 18 (SUTURE)
SUT NYLON ETHILON 5-0 P-3 1X18 (SUTURE) IMPLANT
SUT VIC AB 5-0 PS2 18 (SUTURE) IMPLANT
SWAB COLLECTION DEVICE MRSA (MISCELLANEOUS) IMPLANT
SYR BULB 3OZ (MISCELLANEOUS) IMPLANT
TAPE CLOTH SURG 6X10 WHT LF (GAUZE/BANDAGES/DRESSINGS) ×9 IMPLANT
TOWEL OR 17X24 6PK STRL BLUE (TOWEL DISPOSABLE) ×3 IMPLANT
TOWEL OR 17X26 10 PK STRL BLUE (TOWEL DISPOSABLE) ×3 IMPLANT
TUBE ANAEROBIC SPECIMEN COL (MISCELLANEOUS) IMPLANT
UNDERPAD 30X30 INCONTINENT (UNDERPADS AND DIAPERS) ×3 IMPLANT

## 2015-08-04 NOTE — Progress Notes (Signed)
Hypoglycemic Event  CBG: 59  Treatment: D50 IV 25 mL  Symptoms: None  Follow-up CBG: Time:1238 CBG Result:98  Possible Reasons for Event: Inadequate meal intake and Other: NPO for OR later today  Comments/MD notified:Dr. Tinnie Gens, Mignonne Afonso B

## 2015-08-04 NOTE — H&P (Signed)
Jason [name] is an 29 y.o. male.   Chief Complaint: sacral ulcer HPI: The patient is a 29 yrs old bm here for treatment of sepsis.  There was concern about a sacral abscess.  He has a sacral ulcer and has been debrided several times.  He has a trach.  His sepsis has improved and his mental state is doing very well.   Past Medical History  Diagnosis Date  . GERD (gastroesophageal reflux disease)   . Asthma   . Hx MRSA infection     on face  . Gastroparesis   . Diabetic neuropathy ([place])   . Seizures ([place])   . Family history of anesthesia complication     Pt mother can't have epidural procedures  . Dysrhythmia   . Pneumonia   . Arthritis   . Fibromyalgia   . 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 Type I diabetes mellitus ([place])     sees Dr. Loanne Drilling   . Syncope 02/16/2015    Past Surgical History  Procedure Laterality Date  . Tonsillectomy    . Multiple extractions with alveoloplasty N/A 08/03/2014    Procedure: MULTIPLE EXTRACTIONS;  Surgeon: Gae Bon, DDS;  Location: [place];  Service: Oral Surgery;  Laterality: N/A;  . Tee without cardioversion N/A 08/17/2014    Procedure: TRANSESOPHAGEAL ECHOCARDIOGRAM (TEE);  Surgeon: Dorothy Spark, MD;  Location: [facility] ENDOSCOPY;  Service: Cardiovascular;  Laterality: N/A;  . Debridment of decubitus ulcer N/A 10/04/2014    Procedure: DEBRIDMENT OF DECUBITUS ULCER;  Surgeon: Georganna Skeans, MD;  Location: [place];  Service: General;  Laterality: N/A;  . Laparoscopic diverted colostomy N/A 10/12/2014    Procedure: LAPAROSCOPIC DIVERTING COLOSTOMY;  Surgeon: Donnie Mesa, MD;  Location: [place];  Service: General;  Laterality: N/A;  . Insertion of suprapubic catheter N/A 10/12/2014    Procedure: INSERTION OF SUPRAPUBIC CATHETER;  Surgeon: Reece Packer, MD;  Location: [place];  Service: Urology;  Laterality: N/A;  . Incision and drainage of wound N/A 11/12/2014    Procedure: IRRIGATION AND DEBRIDEMENT OF WOUNDS WITH  BONE BIOPSY AND SURGICAL PREP ;  Surgeon: Theodoro Kos, DO;  Location: [place];  Service: Plastics;  Laterality: N/A;  . Application of a-cell of back N/A 11/12/2014    Procedure: APPLICATION A CELL AND VAC ;  Surgeon: Theodoro Kos, DO;  Location: [place];  Service: Plastics;  Laterality: N/A;  . Incision and drainage of wound N/A 11/18/2014    Procedure: IRRIGATION AND DEBRIDEMENT OF SACRAL ULCER ONLY WITH PLACEMENT OF A CELL AND VAC/ DRESSING CHANGE TO UPPER BACK AREA.;  Surgeon: Theodoro Kos, DO;  Location: [place];  Service: Plastics;  Laterality: N/A;  . Incision and drainage of wound N/A 11/25/2014    Procedure: IRRIGATION AND DEBRIDEMENT OF SACRAL ULCER AND BACK BURN WITH PLACEMENT OF A-CELL;  Surgeon: Theodoro Kos, DO;  Location: [place];  Service: Plastics;  Laterality: N/A;  . Minor application of wound vac N/A 11/25/2014    Procedure:  WOUND VAC CHANGE;  Surgeon: Theodoro Kos, DO;  Location: [place];  Service: Plastics;  Laterality: N/A;  . Incision and drainage of wound Right 12/08/2014    Procedure: IRRIGATION AND DEBRIDEMENT SACRAL WOUND AND RIGHT ISCHIAL WOUND ;  Surgeon: Theodoro Kos, DO;  Location: [place];  Service: Plastics;  Laterality: Right;  . Application of a-cell of back N/A 12/08/2014    Procedure: APPLICATION OF A-CELL AND WOUND VAC ;  Surgeon:  Claire Sanger, DO;  Location: [place];  Service: Clinical cytogeneticist;  Laterality: N/A;  . Tee without cardioversion N/A 05/05/2015    Procedure: TRANSESOPHAGEAL ECHOCARDIOGRAM (TEE);  Surgeon: Lelon Perla, MD;  Location: [place];  Service: Cardiovascular;  Laterality: N/A;  . Incision and drainage of wound Bilateral 05/05/2015    Procedure: IRRIGATION AND DEBRIDEMENT SACRAL ULCER;  Surgeon: Theodoro Kos, DO;  Location: [place];  Service: Plastics;  Laterality: Bilateral;  . Hematoma evacuation N/A 05/05/2015    Procedure: EVACUATION HEMATOMA bedside procedure;  Surgeon: Theodoro Kos, DO;  Location: [place];  Service: Plastics;  Laterality: N/A;     Family History  Problem Relation Age of Onset  . Diabetes Father   . Hypertension Father   . Asthma      fhx  . Hypertension      fhx  . Stroke      fhx  . Heart disease Mother    Social History:  reports that he quit smoking about 10 months ago. His smoking use included Cigars and Cigarettes. He has a 12 pack-year smoking history. He has never used smokeless tobacco. He reports that he does not drink alcohol or use illicit drugs.  Allergies:  Allergies  Allergen Reactions  . Cefuroxime Axetil Anaphylaxis  . Ertapenem Other (See Comments)    Rash and confusion-->tolerated Imipenem   . Morphine And Related Other (See Comments)    Changed mental status, confusion, headache, visual hallucination  . Penicillins Anaphylaxis and Other (See Comments)    Tolerated Imipenem; no reaction to 7 day course of amoxicillin in 2015 Has patient had a PCN reaction causing immediate rash, facial/tongue/throat swelling, SOB or lightheadedness with hypotension: Yes Has patient had a PCN reaction causing severe rash involving mucus membranes or skin necrosis: No Has patient had a PCN reaction that required hospitalization Yes Has patient had a PCN reaction occurring within the last 10 years: No If all of the above answers are "NO", then may proceed with Cephalo  . Sulfa Antibiotics Anaphylaxis, Shortness Of Breath and Other (See Comments)  . Tessalon [Benzonatate] Anaphylaxis  . Shellfish Allergy Itching and Other (See Comments)    Took benadryl to alleviate reaction  . Nsaids Other (See Comments)    Risk of bleeding  . Miripirium Rash and Other (See Comments)    Change in mental status    Medications Prior to Admission  Medication Sig Dispense Refill  . acetaminophen (TYLENOL) 325 MG tablet Take 2 tablets (650 mg total) by mouth every 6 (six) hours as needed for mild pain, moderate pain, fever or headache.    . albuterol (PROVENTIL) (2.5 MG/3ML) 0.083% nebulizer solution Take 3 mLs (2.5 mg  total) by nebulization every 4 (four) hours as needed for wheezing or shortness of breath. 75 mL 3  . baclofen (LIORESAL) 10 MG tablet Take 1-2 tablets (10-20 mg total) by mouth 3 (three) times daily. Takes 71m in morning and lunchtime and takes 257mat bedtime (Patient taking differently: 10-20 mg by PEG Tube route 3 (three) times daily. Take 1 tablet (10 mg) by mouth at 9am and 6pm, take 2 tablets (20 mg) daily at bedtime - 10pm) 120 each 5  . collagenase (SANTYL) ointment Apply 1 application topically daily. Apply to back and foot wound every M/W/F (Patient taking differently: Apply 1 application topically every Monday, Wednesday, and Friday. Apply topically to foot wound) 90 g 5  . doxycycline (VIBRA-TABS) 100 MG tablet Take 1 tablet (100 mg total) by mouth 2 (  two) times daily. (Patient taking differently: Take 100 mg by mouth 2 (two) times daily. 3 month course started approximately 07/01/15) 60 tablet 2  . Emollient (COLLAGEN EX) Apply 1 application topically daily. Apply daily to sacral/ischial wound, pack with dry kerlix, cover with abd gauze    . fentaNYL (DURAGESIC - DOSED MCG/HR) 50 MCG/HR Place 1 patch (50 mcg total) onto the skin every 3 (three) days. 10 patch 0  . glucagon (GLUCAGON EMERGENCY) 1 MG injection INJECT INTO MUSCLE ONCE AS NEEDED (Patient taking differently: Inject 1 mg into the muscle once as needed (low blood sugar). ) 1 kit 0  . GLUCERNA ([place]) LIQD by PEG Tube route continuous. 70 ml/hr    . insulin aspart (NOVOLOG FLEXPEN) 100 UNIT/ML FlexPen Inject 0-10 Units into the skin 4 (four) times daily as needed for high blood sugar (CBG >200). Per sliding scale: CBG <200 0 units, 201-300 4 units, 301-400 6 units, >400 10 units    . insulin glargine (LANTUS) 100 UNIT/ML injection Inject 0.05 mLs (5 Units total) into the skin at bedtime. (Patient taking differently: Inject 15 Units into the skin at bedtime. ) 10 mL 11  . loratadine (CLARITIN) 10 MG tablet Place 1 tablet (10 mg  total) into feeding tube at bedtime. (Patient taking differently: 10 mg by PEG Tube route at bedtime. )    . LORazepam (ATIVAN) 0.5 MG tablet Take 0.5 mg by mouth 3 (three) times daily as needed for anxiety.    . midodrine (PROAMATINE) 10 MG tablet Take 2 tablets (20 mg total) by mouth 3 (three) times daily with meals. (Patient taking differently: 30 mg by PEG Tube route 3 (three) times daily with meals. 9am, 12pm, 6pm) 180 tablet 6  . mirtazapine (REMERON) 7.5 MG tablet Place 1 tablet (7.5 mg total) into feeding tube at bedtime. (Patient taking differently: 7.5 mg by PEG Tube route at bedtime as needed (sleep). ) 30 tablet 6  . Multiple Vitamin (MULTIVITAMIN WITH MINERALS) TABS tablet Place 1 tablet into feeding tube daily. (Patient taking differently: 1 tablet by PEG Tube route daily. )    . ondansetron (ZOFRAN ODT) 8 MG disintegrating tablet Place 1 tablet (8 mg total) into feeding tube every 8 (eight) hours as needed for nausea or vomiting. (Patient taking differently: 8 mg by PEG Tube route every 8 (eight) hours as needed for nausea or vomiting. ) 60 tablet 5  . Oxycodone HCl 20 MG TABS Take 1 tablet (20 mg total) by mouth every 6 (six) hours as needed. With dressing changes and for break through pain but NO MORE THAN 4 PER DAY (Patient taking differently: Take 20 mg by mouth every 6 (six) hours as needed (breakthrough pain). With dressing changes and for break through pain but NO MORE THAN 4 PER DAY) 120 tablet 0  . pantoprazole sodium (PROTONIX) 40 mg/20 mL PACK Place 20 mLs (40 mg total) into feeding tube daily. (Patient taking differently: 40 mg by PEG Tube route daily. ) 30 each 6  . pregabalin (LYRICA) 100 MG capsule Place 1 capsule (100 mg total) into feeding tube 3 (three) times daily. Take 100 mg by mouth in the morning, take 100 mg by mouth in the afternoon, take 100 mg by mouth in the evening and take 200 mg by mouth at bedtime. (Patient taking differently: 100-200 mg by PEG Tube route 4  (four) times daily. Take 1 capsule (100 mg) by mouth daily at 9am, 12pm, and 6pm, take 2 capsules (200 mg)  daily at bedtime - 10pm) 150 capsule 4  . ACCU-CHEK SMARTVIEW test strip Use to check blood sugar 4 times daily. 200 each 1  . ciprofloxacin (CIPRO) 500 MG tablet Take 1 tablet (500 mg total) by mouth 2 (two) times daily. (Patient not taking: Reported on 07/10/2015) 20 tablet 0  . insulin aspart (NOVOLOG) 100 UNIT/ML injection Inject 5 Units into the skin 3 (three) times daily with meals. (Patient not taking: Reported on 07/10/2015) 10 mL 11  . Insulin Syringes, Disposable, U-100 1 ML MISC Dispense 29G x 1/2 inch, use as directed, diagnosis code E 10.41 100 each 1  . LORazepam (ATIVAN) 1 MG tablet Take 1 tablet (1 mg total) by mouth every 6 (six) hours as needed for anxiety. (Patient not taking: Reported on 07/10/2015) 120 tablet 2  . Nutritional Supplements (FEEDING SUPPLEMENT, VITAL AF 1.2 CAL,) LIQD Place 1,000 mLs into feeding tube continuous. (Patient not taking: Reported on 07/10/2015) 1000 mL 11    Results for orders placed or performed during the hospital encounter of 07/10/15 (from the past 48 hour(s))  Glucose, capillary     Status: Abnormal   Collection Time: 08/02/15  9:15 PM  Result Value Ref Range   Glucose-Capillary 178 (H) 65 - 99 mg/dL  Glucose, capillary     Status: Abnormal   Collection Time: 08/03/15 12:18 AM  Result Value Ref Range   Glucose-Capillary 227 (H) 65 - 99 mg/dL  Glucose, capillary     Status: Abnormal   Collection Time: 08/03/15  4:40 AM  Result Value Ref Range   Glucose-Capillary 316 (H) 65 - 99 mg/dL  Glucose, capillary     Status: Abnormal   Collection Time: 08/03/15  6:10 AM  Result Value Ref Range   Glucose-Capillary 345 (H) 65 - 99 mg/dL  Glucose, capillary     Status: Abnormal   Collection Time: 08/03/15  7:33 AM  Result Value Ref Range   Glucose-Capillary 386 (H) 65 - 99 mg/dL  Glucose, capillary     Status: Abnormal   Collection Time: 08/03/15  12:01 PM  Result Value Ref Range   Glucose-Capillary 227 (H) 65 - 99 mg/dL  Glucose, capillary     Status: Abnormal   Collection Time: 08/03/15  5:34 PM  Result Value Ref Range   Glucose-Capillary 130 (H) 65 - 99 mg/dL  Glucose, capillary     Status: Abnormal   Collection Time: 08/03/15  9:10 PM  Result Value Ref Range   Glucose-Capillary 121 (H) 65 - 99 mg/dL  CBC     Status: Abnormal   Collection Time: 08/04/15  4:19 AM  Result Value Ref Range   WBC 15.6 (H) 4.0 - 10.5 K/uL   RBC 3.05 (L) 4.22 - 5.81 MIL/uL   Hemoglobin 8.0 (L) 13.0 - 17.0 g/dL   HCT 26.1 (L) 39.0 - 52.0 %   MCV 85.6 78.0 - 100.0 fL   MCH 26.2 26.0 - 34.0 pg   MCHC 30.7 30.0 - 36.0 g/dL   RDW 15.3 11.5 - 15.5 %   Platelets 844 (H) 150 - 400 K/uL  Basic metabolic panel     Status: Abnormal   Collection Time: 08/04/15  4:19 AM  Result Value Ref Range   Sodium 129 (L) 135 - 145 mmol/L   Potassium 4.5 3.5 - 5.1 mmol/L   Chloride 95 (L) 101 - 111 mmol/L   CO2 26 22 - 32 mmol/L   Glucose, Bld 123 (H) 65 - 99 mg/dL   BUN  12 6 - 20 mg/dL   Creatinine, Ser 0.58 (L) 0.61 - 1.24 mg/dL   Calcium 8.5 (L) 8.9 - 10.3 mg/dL   GFR calc non Af Amer >60 >60 mL/min   GFR calc Af Amer >60 >60 mL/min    Comment: (NOTE) The eGFR has been calculated using the CKD EPI equation. This calculation has not been validated in all clinical situations. eGFR's persistently <60 mL/min signify possible Chronic Kidney Disease.    Anion gap 8 5 - 15  Glucose, capillary     Status: Abnormal   Collection Time: 08/04/15  8:03 AM  Result Value Ref Range   Glucose-Capillary 58 (L) 65 - 99 mg/dL  Glucose, capillary     Status: Abnormal   Collection Time: 08/04/15  8:27 AM  Result Value Ref Range   Glucose-Capillary 106 (H) 65 - 99 mg/dL  Glucose, capillary     Status: Abnormal   Collection Time: 08/04/15 12:03 PM  Result Value Ref Range   Glucose-Capillary 59 (L) 65 - 99 mg/dL  Glucose, capillary     Status: None   Collection Time:  08/04/15 12:38 PM  Result Value Ref Range   Glucose-Capillary 98 65 - 99 mg/dL  Glucose, capillary     Status: Abnormal   Collection Time: 08/04/15  5:18 PM  Result Value Ref Range   Glucose-Capillary 48 (L) 65 - 99 mg/dL   No results found.  Review of Systems  Constitutional: Negative.   HENT: Negative.   Eyes: Negative.   Respiratory: Negative.   Cardiovascular: Negative.   Gastrointestinal: Negative.   Genitourinary: Negative.   Musculoskeletal: Negative.   Skin: Negative.     Blood pressure 112/74, pulse 81, temperature 100.8 F (38.2 C), temperature source Oral, resp. rate 18, height 5' 7"  (1.702 m), weight 55 kg (121 lb 4.1 oz), SpO2 97 %. Physical Exam  Constitutional: He appears well-developed.  HENT:  Head: Normocephalic.  Eyes: Conjunctivae and EOM are normal. Pupils are equal, round, and reactive to light.  Respiratory: Effort normal.  Musculoskeletal:       Back:  Neurological: He is alert.  Skin: Skin is warm.  Psychiatric: He has a normal mood and affect.     Assessment/Plan Recommend and plan for debridement of sacral ulcer with possible Acell and VAC placement.  [name] Going 08/04/2015, 5:51 PM

## 2015-08-04 NOTE — Progress Notes (Signed)
Patient seen for trach team follow up.  No education needed at this time.  Patient has all needed equipment at the bedside.  Will continue to follow.

## 2015-08-04 NOTE — Anesthesia Preprocedure Evaluation (Addendum)
Anesthesia Evaluation  Patient identified by MRN, date of birth, ID band Patient awake    Reviewed: Allergy & Precautions, NPO status , Patient's Chart, lab work & pertinent test results  Airway Mallampati: [place]  (+) Teeth Intact   Pulmonary asthma , pneumonia, former smoker,    breath sounds clear to auscultation       Cardiovascular  Rhythm:Regular Rate:Normal     Neuro/Psych Seizures -,  PSYCHIATRIC DISORDERS  Neuromuscular disease CVA    GI/Hepatic Neg liver ROS, GERD  Medicated,  Endo/Other  diabetes, Type 1, Insulin Dependent  Renal/GU Renal disease  negative genitourinary   Musculoskeletal  (+) Arthritis ,   Abdominal   Peds negative pediatric ROS (+)  Hematology negative hematology ROS (+)   Anesthesia Other Findings   Reproductive/Obstetrics negative OB ROS                            Lab Results  Component Value Date   WBC 15.6* 08/04/2015   HGB 8.0* 08/04/2015   HCT 26.1* 08/04/2015   MCV 85.6 08/04/2015   PLT 844* 08/04/2015   Lab Results  Component Value Date   CREATININE 0.58* 08/04/2015   BUN 12 08/04/2015   NA 129* 08/04/2015   K 4.5 08/04/2015   CL 95* 08/04/2015   CO2 26 08/04/2015   Lab Results  Component Value Date   INR 1.18 07/11/2015   INR 1.10 05/26/2015   INR 4.44* 05/20/2015   EKG: normal sinus rhythm.   Anesthesia Physical Anesthesia Plan  ASA: III  Anesthesia Plan: General   Post-op Pain Management:    Induction: Intravenous  Airway Management Planned: Tracheostomy  Additional Equipment:   Intra-op Plan:   Post-operative Plan:   Informed Consent: I have reviewed the patients History and Physical, chart, labs and discussed the procedure including the risks, benefits and alternatives for the proposed anesthesia with the patient or authorized representative who has indicated his/her understanding and acceptance.      Plan Discussed with: CRNA  Anesthesia Plan Comments:         Anesthesia Quick Evaluation

## 2015-08-04 NOTE — Anesthesia Postprocedure Evaluation (Signed)
  Anesthesia Post-op Note  Patient: Jason [name]  Procedure(s) Performed: Procedure(s) (LRB): IRRIGATION AND DEBRIDEMENT OF SACRAL ULCER AND  (N/A) APPLICATION OF A-CELL  (N/A)  Patient Location: PACU  Anesthesia Type: General  Level of Consciousness: awake and alert   Airway and Oxygen Therapy: Patient Spontanous Breathing  Post-op Pain: mild  Post-op Assessment: Post-op Vital signs reviewed, Patient's Cardiovascular Status Stable, Respiratory Function Stable, Patent Airway and No signs of Nausea or vomiting  Last Vitals:  Filed Vitals:   08/04/15 1624  BP: 112/74  Pulse: 81  Temp: 38.2 C  Resp: 18    Post-op Vital Signs: stable   Complications: No apparent anesthesia complications

## 2015-08-04 NOTE — Progress Notes (Signed)
[facility] Progression Note   Patient Details Name: Jason [name] MRN: [id number] DOB: October 16, 1985 Today's Date: 08/04/2015   Tracheostomy Assessment    Tracheostomy Shiley 6 mm Cuffed (Active)  Status Passy Muir Speaking valve 08/04/2015 12:36 PM  Site Assessment Clean 08/04/2015 12:36 PM  Site Care Dried 08/03/2015 12:01 PM  Inner Cannula Care Other (Comment) 08/02/2015  8:00 AM  Ties Assessment Secure 08/04/2015 12:36 PM  Cuff pressure (cm) 0 cm 08/04/2015 12:36 PM  Trach Changed Yes 08/01/2015  4:00 AM  Emergency Equipment at bedside Yes 08/04/2015 12:36 PM     Care Needs     Respiratory Therapy Tracheostomy: Chronic trach O2 Device: Not Delivered FiO2 (%): 97 % SpO2: 97 % Education: Ongoing Who was educated?: Patient, Family Follow up recommendations:  (will follow for progression) Respiratory barriers to progression: Other (comment) (fevers)    Speech Language Pathology  SLP chart review complete: Patient does not need SLP services at this time (has PMSV prior visit, seen for swallow, tolerating diet, SLP signed offPatient may use Passy-Muir Speech Valve: During all waking hours (remove during sleep) PMSV Supervision: Intermittent Follow up Recommendations: None   Physical Therapy      Occupational Therapy      Nutritional Patient's Current Diet: Regular, Tube feeding Tube Feeding: Vital AF 1.2 Cal Tube Feeding Frequency: Intermittent (6p-6a) Tube Feeding Strength: Full strength SLP Diet Recommendations: Age appropriate regular solids, Thin    Case Management/Social Work      Provider  Pt reports he wants to be decannulated.            Stamatia Masri, Katherene Ponto 08/04/2015, 2:15 PM

## 2015-08-04 NOTE — Op Note (Signed)
Operative Note   DATE OF OPERATION: 08/04/2015  LOCATION: [name]  SURGICAL DIVISION: Plastic Surgery  PREOPERATIVE DIAGNOSES:  1 Sacral and 2 ischial ulcer (5 x 7 x 2 cm sacral and 4 x 6 x 4 cm ischial x 2), Back 5 x 5 cm  POSTOPERATIVE DIAGNOSES:  same  PROCEDURE:  Preparation of sacral / Ischial / back ulcer with placement of Acell (2.5 gm and sheets 5 x 5 cm) and VAC. Bone biopsy sent  SURGEON: Theodoro Kos, DO  ASSISTANT: Erlinda Hong, PA  ANESTHESIA:  General.   COMPLICATIONS: None.   INDICATIONS FOR PROCEDURE:  The patient, Jason [name] is a 29 y.o. male born on 1986-06-04, is here for treatment of sacral ischial ulcers. MRN: [id number]  CONSENT:  Informed consent was obtained directly from the patient. Risks, benefits and alternatives were fully discussed. Specific risks including but not limited to bleeding, infection, hematoma, seroma, scarring, pain, infection, contracture, asymmetry, wound healing problems, and need for further surgery were all discussed. The patient did have an ample opportunity to have questions answered to satisfaction.   DESCRIPTION OF PROCEDURE:  The patient was taken to the operating room. The patient's operative site was prepped and draped in a sterile fashion. A time out was performed and all information was confirmed to be correct.  General  anesthesia was administered.   A biopsy for micro was obtained from the sacral bone. The bovie was used for hemostasis but no sharpe debridement was done.  All of the Acell powder was used to place in each wound.  Surgicel was placed in the right ischial ulcer precautionary.  No abscess was found in any of the wounds. The ischial ulcers were packed with kerlex. The Acell sheet 5 x 5 cm was applied to the sacral wound.  An adaptic, [place] gel and gauze was applied.   Acell and adaptic was applied on the back wound.  The patient tolerated the procedure well.  There were no complications. The  patient was allowed to wake from anesthesia and taken to the recovery room in satisfactory condition.

## 2015-08-04 NOTE — Brief Op Note (Signed)
07/10/2015 - 08/04/2015  5:54 PM  PATIENT:  Jason [name]  29 y.o. male  PRE-OPERATIVE DIAGNOSIS:  SACRAL ULCER   POST-OPERATIVE DIAGNOSIS:  Sacral / ischial ulcers  PROCEDURE:  Procedure(s): IRRIGATION AND DEBRIDEMENT OF SACRAL ULCER AND  (N/A) APPLICATION OF A-CELL  (N/A)  SURGEON:  Surgeon(s) and Role:    * Yareni Creps S Clovia Reine, DO - Primary  PHYSICIAN ASSISTANT:   ASSISTANTS: none   ANESTHESIA:   general  EBL:  Total I/O In: 0  Out: 1300 [Urine:1000; Stool:300]  BLOOD ADMINISTERED:none  DRAINS: none   LOCAL MEDICATIONS USED:  NONE  SPECIMEN:  Source of Specimen:  bone  DISPOSITION OF SPECIMEN:  micro  COUNTS:  YES  TOURNIQUET:  * No tourniquets in log *  DICTATION: .Dragon Dictation  PLAN OF CARE: Admit to inpatient   PATIENT DISPOSITION:  PACU - hemodynamically stable.   Delay start of Pharmacological VTE agent (>24hrs) due to surgical blood loss or risk of bleeding: no

## 2015-08-04 NOTE — Transfer of Care (Signed)
Immediate Anesthesia Transfer of Care Note  Patient: Jason [name]  Procedure(s) Performed: Procedure(s): IRRIGATION AND DEBRIDEMENT OF SACRAL ULCER AND  (N/A) APPLICATION OF A-CELL  (N/A)  Patient Location: PACU  Anesthesia Type:General  Level of Consciousness: sedated and patient cooperative  Airway & Oxygen Therapy: Patient connected to face mask oxygen  Post-op Assessment: Report given to RN and Post -op Vital signs reviewed and stable  Post vital signs: Reviewed and stable  Last Vitals:  Filed Vitals:   08/04/15 1624  BP: 112/74  Pulse: 81  Temp: 38.2 C  Resp: 18    Complications: No apparent anesthesia complications

## 2015-08-04 NOTE — Progress Notes (Signed)
Hypoglycemic Event  CBG: 58  Treatment: D50 IV 25 mL  Symptoms: None  Follow-up CBG: Time:0827 CBG Result:106  Possible Reasons for Event: Inadequate meal intake and Other: Pt is NPO for OR today  Comments/MD notified:Dr. Tinnie Gens, Gar Glance B

## 2015-08-04 NOTE — Progress Notes (Signed)
PROGRESS NOTE    Jason [name] WJX:[id number] DOB: May 03, 1986 DOA: 07/10/2015 PCP: [name], MD  HPI/Brief narrative 29 y.o. 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 Patient was recently tapered off his IV antibiotics and was placed on doxycycline and Cipro 3 days prior to this admit for possible UTI.   In the ER patient UA shows features concerning for UTI - patient has chronic suprapubic catheter. Patient was also found to be hypothermic and hypotensive. Patient has had previous history of multiple debridement for chronic sacral decubitus which at this time looks to be healing. Patient also has history of chronic osteomyelitis.    Assessment/Plan: Toxic metabolic encephalopathy Resolved - patient is presently alert and oriented  Sepsis due to MDR Pseudomonas Aeruginosa UTI ?  Vs decubitus/osteomyelitis - suprapubic catheter changed - ID and plastics following: CT Scan showed: Sacral decubitus ulcer with prior distal sacrococcygeal destruction or resection and foci of gas within the LEFT gluteus maximus question infection. - Dr. Migdalia Dk. She is unable to get patient on the schedule until Thursday . Still spiking fevers, but trending douwn. Medicaid has no LTAC coverage. Infectious disease ordered urine culture and resumed fosfomycin-- growing yeast and pseudomonas   Acute renal failure Due to sepsis - renal function has normalized  Hyponatremia  Mild, monitor  Hypomagnesemia  Replaced to goal of 2.0  Diabetes mellitus type 1 with hyperglycemia Improved on higher lantus dose  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 Plastics/ID consulted  Chronic anemia Hgb initially dropped w/ volume expansion -  s/p 2U PRBC - no evidence of acute blood loss - hemoglobin appears stable at this time   Chronic respiratory failure with trach and vent pulmonary critical care supposed to be managing vent - it appears the patient took himself off the vent 10/7 and has not been back on since - currently appears stable on 28% trach collar  - As per PCCM ELink note 10/3, it appears that patient is followed in OP trach clinic> will defer further trach decisions to OP follow up in that clinic. - Discussed with CCM M.D. on call 10/16: Agrees with outpatient follow-up with trach clinic and indicates that he may not be decannulated.   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 IVC filter placed 8/6  History of spinal canal injury with paraplegia  Severe malnutrition in context of chronic illness, Underweight On night time TF  DVT Prophylaxis: Lovenox Code Status: Full Family Communication: no family at bedside Disposition Plan:    Consultants:  PCCM   Infectious disease  Plastics- [facility]  Procedures:  None  Antibiotics:  Imipenem 10/2 > 10/11  Vanc 10/2 > 10/11  Fosfomycin 10/6 >   Vancomycin 10/15 >   Subjective: For surgery today, no complaints  Objective: Filed Vitals:   08/04/15 0320 08/04/15 0500 08/04/15 0805 08/04/15 1236  BP:  93/64    Pulse: 92 80 81 85  Temp:  100.5 F (38.1 C)    TempSrc:  Oral    Resp: 20 18 16 16   Height:      Weight:  55 kg (121 lb 4.1 oz)    SpO2: 97% 97% 97%     Intake/Output Summary (Last 24 hours) at 08/04/15 1244 Last data filed at 08/04/15 0500  Gross  per 24 hour  Intake    930 ml  Output   2650 ml  Net  -1720 ml   Filed Weights   08/01/15 0424 08/03/15 0500 08/04/15 0500  Weight: 54.2 kg (119 lb 7.8 oz) 53 kg (116 lb 13.5 oz) 55 kg (121 lb 4.1 oz)     Exam:  General exam: Comfortable. Nontoxic appearing.  smiling Respiratory system: Clear. No increased work of breathing. Cardiovascular system: S1 & S2 heard, RRR. No JVD, murmurs, gallops,  clicks or pedal edema. Gastrointestinal system: Abdomen is nondistended, soft and nontender. Normal bowel sounds heard.- ostomy functioning   Data Reviewed: Basic Metabolic Panel:  Recent Labs Lab 07/29/15 0945 07/30/15 0435 08/01/15 0541 08/02/15 0015 08/04/15 0419  NA 129* 134* 134* 131* 129*  K 4.4 5.1 4.8 4.8 4.5  CL 98* 103 103 100* 95*  CO2 21* 21* 23 23 26   GLUCOSE 165* 160* 159* 217* 123*  BUN 21* 24* 16 11 12   CREATININE 0.87 1.03 0.76 0.66 0.58*  CALCIUM 8.5* 8.9 8.8* 8.6* 8.5*   Liver Function Tests: No results for input(s): AST, ALT, ALKPHOS, BILITOT, PROT, ALBUMIN in the last 168 hours. No results for input(s): LIPASE, AMYLASE in the last 168 hours. No results for input(s): AMMONIA in the last 168 hours. CBC:  Recent Labs Lab 07/29/15 0945 07/30/15 0435 08/02/15 0015 08/04/15 0419  WBC 17.7* 16.6* 16.5* 15.6*  NEUTROABS  --  10.7*  --   --   HGB 9.2* 9.7* 8.3* 8.0*  HCT 30.3* 30.6* 27.2* 26.1*  MCV 87.6 86.7 86.1 85.6  PLT 819* 747* 914* 844*   Cardiac Enzymes: No results for input(s): CKTOTAL, CKMB, CKMBINDEX, TROPONINI in the last 168 hours. BNP (last 3 results) No results for input(s): PROBNP in the last 8760 hours. CBG:  Recent Labs Lab 08/03/15 1201 08/03/15 1734 08/03/15 2110 08/04/15 0803 08/04/15 0827  GLUCAP 227* 130* 121* 58* 106*    Recent Results (from the past 240 hour(s))  Culture, blood (routine x 2)     Status: None   Collection Time: 07/27/15  7:05 AM  Result Value Ref Range Status   Specimen Description BLOOD RIGHT HAND  Final   Special Requests BOTTLES DRAWN AEROBIC ONLY 3CC  Final   Culture NO GROWTH 5 DAYS  Final   Report Status 08/01/2015 FINAL  Final  Culture, blood (routine x 2)     Status: None   Collection Time: 07/27/15  7:10 AM  Result Value Ref Range Status   Specimen Description BLOOD RIGHT ARM  Final   Special Requests BOTTLES DRAWN AEROBIC ONLY 3CC  Final   Culture NO GROWTH 5 DAYS  Final   Report  Status 08/01/2015 FINAL  Final  Culture, Urine     Status: None (Preliminary result)   Collection Time: 07/29/15  6:06 PM  Result Value Ref Range Status   Specimen Description URINE, SUPRAPUBIC  Final   Special Requests Normal  Final   Culture   Final    >=100,000 COLONIES/mL PSEUDOMONAS AERUGINOSA AMIKACIN MIC=8 SENSITIVE >=100,000 COLONIES/mL YEAST    Report Status PENDING  Incomplete   Organism ID, Bacteria PSEUDOMONAS AERUGINOSA  Final      Susceptibility   Pseudomonas aeruginosa - MIC*    CEFTAZIDIME 16 INTERMEDIATE Intermediate     CIPROFLOXACIN >=4 RESISTANT Resistant     GENTAMICIN >=16 RESISTANT Resistant     IMIPENEM >=16 RESISTANT Resistant     CEFEPIME 16 INTERMEDIATE Intermediate     * >=100,000  COLONIES/mL PSEUDOMONAS AERUGINOSA           Studies: No results found.      Scheduled Meds: . baclofen  10 mg Oral 2 times per day  . baclofen  20 mg Oral QHS  . collagenase   Topical Daily  . enoxaparin (LOVENOX) injection  40 mg Subcutaneous Daily  . feeding supplement (VITAL AF 1.2 CAL)  1,000 mL Per Tube Q24H  . fentaNYL  50 mcg Transdermal Q72H  . Influenza vac split quadrivalent PF  0.5 mL Intramuscular Tomorrow-1000  . insulin aspart  0-15 Units Subcutaneous TID WC  . insulin aspart  0-5 Units Subcutaneous QHS  . insulin glargine  20 Units Subcutaneous Daily  . midodrine  30 mg Oral TID WC  . multivitamin with minerals  1 tablet Oral Daily  . pantoprazole sodium  40 mg Oral Daily  . pregabalin  100 mg Oral 3 times per day  . pregabalin  200 mg Oral QHS   Continuous Infusions: . 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  Eulogio Bear, DO [facility]  [email or web address] Password [email or web address] 08/04/2015, 12:44 PM    LOS: 25 days

## 2015-08-04 NOTE — Progress Notes (Signed)
Patient has been running a temperature this evening.  At 12 midnight, patient received Tylenol 650 mg PO for a temp of 101.4.  Patient also received two ice packs to apply to help lower temp. At 0100, T 101.6.  Patient asymptomatic and he and his mother was not very concerned.  "I usually get a fever at night and then by morning it goes down".  Will continue to monitor and offer Tylenol again if needed.

## 2015-08-04 NOTE — Progress Notes (Signed)
Pt BBS rhonchi at this time. RT suggested pt be suctioned, PT refused. RT asked pt to cough and clear on his own and he said he did not need to that he just needed to sit up. RT raised HOB. RT will continue to follow.

## 2015-08-05 ENCOUNTER — Encounter (HOSPITAL_COMMUNITY): Payer: Self-pay | Admitting: Plastic Surgery

## 2015-08-05 ENCOUNTER — Other Ambulatory Visit: Payer: Self-pay | Admitting: Pulmonary Disease

## 2015-08-05 LAB — CBC
HCT: 27.1 % — ABNORMAL LOW (ref 39.0–52.0)
Hemoglobin: 8.6 g/dL — ABNORMAL LOW (ref 13.0–17.0)
MCH: 27 pg (ref 26.0–34.0)
MCHC: 31.7 g/dL (ref 30.0–36.0)
MCV: 85 fL (ref 78.0–100.0)
Platelets: 894 10*3/uL — ABNORMAL HIGH (ref 150–400)
RBC: 3.19 MIL/uL — ABNORMAL LOW (ref 4.22–5.81)
RDW: 15.1 % (ref 11.5–15.5)
WBC: 21.8 10*3/uL — ABNORMAL HIGH (ref 4.0–10.5)

## 2015-08-05 LAB — GLUCOSE, CAPILLARY
GLUCOSE-CAPILLARY: 277 mg/dL — AB (ref 65–99)
GLUCOSE-CAPILLARY: 357 mg/dL — AB (ref 65–99)
GLUCOSE-CAPILLARY: 389 mg/dL — AB (ref 65–99)
GLUCOSE-CAPILLARY: 208 mg/dL — AB (ref 65–99)
GLUCOSE-CAPILLARY: 122 mg/dL — AB (ref 65–99)
Glucose-Capillary: 475 mg/dL — ABNORMAL HIGH (ref 65–99)

## 2015-08-05 LAB — BASIC METABOLIC PANEL
Anion gap: 9 (ref 5–15)
BUN: 10 mg/dL (ref 6–20)
CALCIUM: 9 mg/dL (ref 8.9–10.3)
CO2: 25 mmol/L (ref 22–32)
CREATININE: 0.61 mg/dL (ref 0.61–1.24)
Chloride: 96 mmol/L — ABNORMAL LOW (ref 101–111)
GFR calc non Af Amer: >60 mL/min (ref >60)
Glucose, Bld: 165 mg/dL — ABNORMAL HIGH (ref 65–99)
Potassium: 4.7 mmol/L (ref 3.5–5.1)
SODIUM: 130 mmol/L — AB (ref 135–145)

## 2015-08-05 LAB — GLUCOSE, RANDOM: GLUCOSE: 508 mg/dL — AB (ref 65–99)

## 2015-08-05 MED ORDER — DIPHENHYDRAMINE-ZINC ACETATE 2-0.1 % EX CREA
TOPICAL_CREAM | Freq: Every day | CUTANEOUS | Status: DC | PRN
  Filled 2015-08-05: qty 28

## 2015-08-05 MED ORDER — INSULIN GLARGINE 100 UNIT/ML ~~LOC~~ SOLN
15.0000 [IU] | Freq: Every day | SUBCUTANEOUS | Status: DC
  Administered 2015-08-06 – 2015-08-09 (×4): 15 [IU] via SUBCUTANEOUS
  Filled 2015-08-05 (×4): qty 0.15

## 2015-08-05 MED ORDER — ACETAMINOPHEN 160 MG/5ML PO SOLN
650.0000 mg | Freq: Four times a day (QID) | ORAL | Status: DC | PRN
  Administered 2015-08-05 – 2015-08-14 (×10): 650 mg
  Filled 2015-08-05 (×12): qty 20.3

## 2015-08-05 MED ORDER — VANCOMYCIN HCL IN DEXTROSE 750-5 MG/150ML-% IV SOLN
750.0000 mg | Freq: Two times a day (BID) | INTRAVENOUS | Status: DC
  Administered 2015-08-05 – 2015-08-07 (×5): 750 mg via INTRAVENOUS
  Filled 2015-08-05 (×7): qty 150

## 2015-08-05 MED ORDER — INSULIN ASPART 100 UNIT/ML ~~LOC~~ SOLN
0.0000 [IU] | Freq: Every day | SUBCUTANEOUS | Status: DC
  Administered 2015-08-08: 2 [IU] via SUBCUTANEOUS

## 2015-08-05 MED ORDER — INSULIN ASPART 100 UNIT/ML ~~LOC~~ SOLN
0.0000 [IU] | Freq: Three times a day (TID) | SUBCUTANEOUS | Status: DC
  Administered 2015-08-05: 5 [IU] via SUBCUTANEOUS
  Administered 2015-08-06: 2 [IU] via SUBCUTANEOUS
  Administered 2015-08-07 – 2015-08-08 (×3): 3 [IU] via SUBCUTANEOUS
  Administered 2015-08-08: 8 [IU] via SUBCUTANEOUS
  Administered 2015-08-08: 5 [IU] via SUBCUTANEOUS
  Administered 2015-08-09: 3 [IU] via SUBCUTANEOUS
  Administered 2015-08-09 (×2): 15 [IU] via SUBCUTANEOUS
  Administered 2015-08-10: 3 [IU] via SUBCUTANEOUS
  Administered 2015-08-10 (×2): 5 [IU] via SUBCUTANEOUS
  Administered 2015-08-11 – 2015-08-12 (×3): 3 [IU] via SUBCUTANEOUS
  Administered 2015-08-12: 2 [IU] via SUBCUTANEOUS
  Administered 2015-08-12: 5 [IU] via SUBCUTANEOUS
  Administered 2015-08-13: 8 [IU] via SUBCUTANEOUS
  Administered 2015-08-13: 5 [IU] via SUBCUTANEOUS

## 2015-08-05 MED ORDER — INSULIN ASPART 100 UNIT/ML ~~LOC~~ SOLN
3.0000 [IU] | Freq: Three times a day (TID) | SUBCUTANEOUS | Status: DC
  Administered 2015-08-05 – 2015-08-09 (×9): 3 [IU] via SUBCUTANEOUS

## 2015-08-05 MED ORDER — ACETAMINOPHEN 160 MG/5ML PO SOLN
325.0000 mg | Freq: Once | ORAL | Status: AC
  Administered 2015-08-05: 325 mg via ORAL
  Filled 2015-08-05: qty 20.3

## 2015-08-05 NOTE — Progress Notes (Signed)
[name] for Infectious Disease    Subjective:   No new complaints  Antibiotics:  Anti-infectives    Start     Dose/Rate Route Frequency Ordered Stop   08/05/15 1100  vancomycin (VANCOCIN) IVPB 750 mg/150 ml premix     750 mg 150 mL/hr over 60 Minutes Intravenous Every 12 hours 08/05/15 1015     08/01/15 1000  fluconazole (DIFLUCAN) tablet 200 mg  Status:  Discontinued     200 mg Oral Daily 08/01/15 0839 08/01/15 1342   07/31/15 1500  vancomycin (VANCOCIN) IVPB 750 mg/150 ml premix  Status:  Discontinued     750 mg 150 mL/hr over 60 Minutes Intravenous Every 48 hours 07/31/15 1322 08/01/15 1342   07/27/15 1800  vancomycin (VANCOCIN) 500 mg in sodium chloride 0.9 % 100 mL IVPB     500 mg 100 mL/hr over 60 Minutes Intravenous Every 12 hours 07/27/15 0048 07/29/15 1846   07/23/15 2300  vancomycin (VANCOCIN) IVPB 750 mg/150 ml premix  Status:  Discontinued     750 mg 150 mL/hr over 60 Minutes Intravenous Every 24 hours 07/23/15 2215 07/27/15 0048   07/18/15 0000  vancomycin (VANCOCIN) IVPB 750 mg/150 ml premix     750 mg 150 mL/hr over 60 Minutes Intravenous Every 24 hours 07/17/15 0508 07/19/15 0123   07/17/15 0100  vancomycin (VANCOCIN) 500 mg in sodium chloride 0.9 % 100 mL IVPB  Status:  Discontinued     500 mg 100 mL/hr over 60 Minutes Intravenous Every 24 hours 07/16/15 1016 07/17/15 0507   07/13/15 2000  imipenem-cilastatin (PRIMAXIN) 250 mg in sodium chloride 0.9 % 100 mL IVPB     250 mg 200 mL/hr over 30 Minutes Intravenous 3 times per day 07/13/15 1046 07/19/15 2256   07/11/15 2200  imipenem-cilastatin (PRIMAXIN) 250 mg in sodium chloride 0.9 % 100 mL IVPB  Status:  Discontinued     250 mg 200 mL/hr over 30 Minutes Intravenous Every 12 hours 07/11/15 1020 07/13/15 1046   07/10/15 2230  imipenem-cilastatin (PRIMAXIN) 250 mg in sodium chloride 0.9 % 100 mL IVPB  Status:  Discontinued     250 mg 200 mL/hr over 30 Minutes Intravenous 3 times per  day 07/10/15 2226 07/11/15 1020   07/10/15 2230  vancomycin (VANCOCIN) 500 mg in sodium chloride 0.9 % 100 mL IVPB  Status:  Discontinued     500 mg 100 mL/hr over 60 Minutes Intravenous Every 24 hours 07/10/15 2226 07/16/15 1016      Medications: Scheduled Meds: . baclofen  10 mg Oral 2 times per day  . baclofen  20 mg Oral QHS  . collagenase   Topical Daily  . enoxaparin (LOVENOX) injection  40 mg Subcutaneous Daily  . fentaNYL  50 mcg Transdermal Q72H  . Influenza vac split quadrivalent PF  0.5 mL Intramuscular Tomorrow-1000  . insulin aspart  0-15 Units Subcutaneous TID WC  . insulin aspart  0-5 Units Subcutaneous QHS  . insulin aspart  3 Units Subcutaneous TID WC  . [START ON 08/06/2015] insulin glargine  15 Units Subcutaneous Daily  . midodrine  30 mg Oral TID WC  . multivitamin with minerals  1 tablet Oral Daily  . pantoprazole sodium  40 mg Oral Daily  . pregabalin  100 mg Oral 3 times per day  . pregabalin  200 mg Oral QHS  . vancomycin  750 mg Intravenous Q12H   Continuous Infusions: . sodium  chloride 10 mL/hr at 08/05/15 1038  . lactated ringers 10 mL/hr at 08/04/15 1721   PRN Meds:.acetaminophen **OR** acetaminophen, albuterol, diphenhydrAMINE-zinc acetate, LORazepam, mirtazapine, ondansetron **OR** ondansetron (ZOFRAN) IV, oxyCODONE, RESOURCE THICKENUP CLEAR    Objective: Weight change:   Intake/Output Summary (Last 24 hours) at 08/05/15 1744 Last data filed at 08/05/15 1513  Gross per 24 hour  Intake 2118.5 ml  Output   2750 ml  Net -631.5 ml   Blood pressure 94/51, pulse 92, temperature 100 F (37.8 C), temperature source Oral, resp. rate 18, height 5\' 7"  (1.702 m), weight 119 lb 0.8 oz (54 kg), SpO2 97 %. Temp:  [97.1 F (36.2 C)-100 F (37.8 C)] 100 F (37.8 C) (10/28 0616) Pulse Rate:  [67-92] 92 (10/28 1613) Resp:  [9-18] 18 (10/28 1613) BP: (81-114)/(51-75) 94/51 mmHg (10/28 0616) SpO2:  [96 %-100 %] 97 % (10/28 1613) Weight:  [119 lb 0.8 oz  (54 kg)] 119 lb 0.8 oz (54 kg) (10/28 1700)  Physical Exam: General: Alert and awake, oriented eating lunch CV: rrr no mgr Lungs:better aiway movement today throughout  SKin not examined today  Urine seems clearer in bag  07/27/15:      Neuro: no new deficits  CBC: CBC Latest Ref Rng 08/04/2015 08/02/2015 07/30/2015  WBC 4.0 - 10.5 K/uL 15.6(H) 16.5(H) 16.6(H)  Hemoglobin 13.0 - 17.0 g/dL 8.0(L) 8.3(L) 9.7(L)  Hematocrit 39.0 - 52.0 % 26.1(L) 27.2(L) 30.6(L)  Platelets 150 - 400 K/uL 844(H) 914(HH) 747(H)       BMET  Recent Labs  08/04/15 0419 08/05/15 0920  NA 129*  --   K 4.5  --   CL 95*  --   CO2 26  --   GLUCOSE 123* 508*  BUN 12  --   CREATININE 0.58*  --   CALCIUM 8.5*  --      Liver Panel  No results for input(s): PROT, ALBUMIN, AST, ALT, ALKPHOS, BILITOT, BILIDIR, IBILI in the last 72 hours.     Sedimentation Rate No results for input(s): ESRSEDRATE in the last 72 hours. C-Reactive Protein No results for input(s): CRP in the last 72 hours.  Micro Results: Recent Results (from the past 720 hour(s))  Blood culture (routine x 2)     Status: None   Collection Time: 07/10/15  4:25 PM  Result Value Ref Range Status   Specimen Description BLOOD LEFT ARM  Final   Special Requests BOTTLES DRAWN AEROBIC AND ANAEROBIC 5CC  Final   Culture NO GROWTH 5 DAYS  Final   Report Status 07/15/2015 FINAL  Final  MRSA PCR Screening     Status: None   Collection Time: 07/11/15  1:00 AM  Result Value Ref Range Status   MRSA by PCR NEGATIVE NEGATIVE Final    Comment:        The GeneXpert MRSA Assay (FDA approved for NASAL specimens only), is one component of a comprehensive MRSA colonization surveillance program. It is not intended to diagnose MRSA infection nor to guide or monitor treatment for MRSA infections.   Blood culture (routine x 2)     Status: None   Collection Time: 07/11/15  4:19 AM  Result Value Ref Range Status   Specimen Description  BLOOD RIGHT FOREARM  Final   Special Requests BOTTLES DRAWN AEROBIC ONLY 2CC  Final   Culture NO GROWTH 5 DAYS  Final   Report Status 07/16/2015 FINAL  Final  Urine culture     Status: None   Collection Time:  07/11/15  5:09 PM  Result Value Ref Range Status   Specimen Description URINE, RANDOM  Final   Special Requests NONE  Final   Culture   Final    >=100,000 COLONIES/mL PSEUDOMONAS AERUGINOSA AMIKACIN MIC=8 SENSITIVE COLISTIN MIC=1 SENSITIVE >=100,000 COLONIES/mL YEAST    Report Status 07/27/2015 FINAL  Final   Organism ID, Bacteria PSEUDOMONAS AERUGINOSA  Final      Susceptibility   Pseudomonas aeruginosa - MIC*    CEFTAZIDIME 8 SENSITIVE Sensitive     CIPROFLOXACIN >=4 RESISTANT Resistant     GENTAMICIN >=16 RESISTANT Resistant     IMIPENEM >=16 RESISTANT Resistant     CEFEPIME 16 INTERMEDIATE Intermediate     AZTREONAM Value in next row       >=16RESISTANT    * >=100,000 COLONIES/mL PSEUDOMONAS AERUGINOSA  Culture, blood (routine x 2)     Status: None   Collection Time: 07/23/15 10:35 PM  Result Value Ref Range Status   Specimen Description BLOOD RIGHT ARM  Final   Special Requests IN PEDIATRIC BOTTLE 3CC  Final   Culture NO GROWTH 5 DAYS  Final   Report Status 07/28/2015 FINAL  Final  Culture, blood (routine x 2)     Status: None   Collection Time: 07/23/15 10:45 PM  Result Value Ref Range Status   Specimen Description BLOOD RIGHT ANTECUBITAL  Final   Special Requests BOTTLES DRAWN AEROBIC AND ANAEROBIC 5CC   Final   Culture NO GROWTH 5 DAYS  Final   Report Status 07/28/2015 FINAL  Final  Culture, blood (routine x 2)     Status: None   Collection Time: 07/27/15  7:05 AM  Result Value Ref Range Status   Specimen Description BLOOD RIGHT HAND  Final   Special Requests BOTTLES DRAWN AEROBIC ONLY 3CC  Final   Culture NO GROWTH 5 DAYS  Final   Report Status 08/01/2015 FINAL  Final  Culture, blood (routine x 2)     Status: None   Collection Time: 07/27/15  7:10 AM    Result Value Ref Range Status   Specimen Description BLOOD RIGHT ARM  Final   Special Requests BOTTLES DRAWN AEROBIC ONLY 3CC  Final   Culture NO GROWTH 5 DAYS  Final   Report Status 08/01/2015 FINAL  Final  Culture, Urine     Status: None (Preliminary result)   Collection Time: 07/29/15  6:06 PM  Result Value Ref Range Status   Specimen Description URINE, SUPRAPUBIC  Final   Special Requests Normal  Final   Culture   Final    >=100,000 COLONIES/mL PSEUDOMONAS AERUGINOSA AMIKACIN MIC=8 SENSITIVE >=100,000 COLONIES/mL YEAST    Report Status PENDING  Incomplete   Organism ID, Bacteria PSEUDOMONAS AERUGINOSA  Final      Susceptibility   Pseudomonas aeruginosa - MIC*    CEFTAZIDIME 16 INTERMEDIATE Intermediate     CIPROFLOXACIN >=4 RESISTANT Resistant     GENTAMICIN >=16 RESISTANT Resistant     IMIPENEM >=16 RESISTANT Resistant     CEFEPIME 16 INTERMEDIATE Intermediate     * >=100,000 COLONIES/mL PSEUDOMONAS AERUGINOSA  Anaerobic culture     Status: None (Preliminary result)   Collection Time: 08/04/15  7:08 PM  Result Value Ref Range Status   Specimen Description TISSUE LEFT SACRAL  Final   Special Requests NONE  Final   Gram Stain   Final    NO WBC SEEN NO ORGANISMS SEEN Performed at [facility]    Culture PENDING  Incomplete  Report Status PENDING  Incomplete  Fungus Culture with Smear     Status: None (Preliminary result)   Collection Time: 08/04/15  7:11 PM  Result Value Ref Range Status   Specimen Description TISSUE LEFT SACRAL  Final   Special Requests NONE  Final   Fungal Smear   Final    NO YEAST OR FUNGAL ELEMENTS SEEN Performed at [facility]    Culture   Final    CULTURE IN PROGRESS FOR FOUR WEEKS Performed at [facility]    Report Status PENDING  Incomplete  AFB culture with smear     Status: None (Preliminary result)   Collection Time: 08/04/15  7:13 PM  Result Value Ref Range Status   Specimen Description TISSUE LEFT  SACRAL  Final   Special Requests NONE  Final   Acid Fast Smear   Final    NO ACID FAST BACILLI SEEN Performed at [facility]    Culture   Final    CULTURE WILL BE EXAMINED FOR 6 WEEKS BEFORE ISSUING A FINAL REPORT Performed at [facility]    Report Status PENDING  Incomplete  Tissue culture     Status: None (Preliminary result)   Collection Time: 08/04/15  7:18 PM  Result Value Ref Range Status   Specimen Description TISSUE LEFT SACRAL  Final   Special Requests NONE  Final   Gram Stain   Final    NO WBC SEEN NO ORGANISMS SEEN Performed at [facility]    Culture PENDING  Incomplete   Report Status PENDING  Incomplete    Studies/Results: No results found.    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 Urology saw pt and it is believed that suprapubic was already changed after culture with yeast was obtained 08/04/15: pt taken to OR for I and D cultures, acell application by Dr. Migdalia Dk Dilligham   Principal Problem:   Acute encephalopathy Active Problems:   Palliative care encounter   Chronic anemia   Uncontrolled type 1 diabetes mellitus with hyperglycemia ([name])   Altered mental status   Tracheostomy dependent ([name])   H/O spinal cord injury   Chronic paraplegia ([name])   Acute on chronic respiratory failure ([name])   Hypomagnesemia   Decubitus ulcer of right perineal ischial region, stage 4 ([name])   Decubitus ulcer of left perineal ischial region, stage 4 ([name])   Decubitus ulcer of sacral region, stage 4 ([name])   Complicated urinary tract infection   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 male with   With spinal cord infarct large decubitus ulcers, recent MRSA and  micro-aerophilic streptococcal bacteremia, respiratory failure requiring tracheostomy complicated by MDR Acinetobacter, MDR pseudomonas who has had yet another admission with septic picture protracted hospilal course with IV vancomycin, imipenem, po fosfomycin.CT scan showing area on the left concerning for progressive pelvic osteomyelitis   # 1 Sacral decubitus ulcer with prior distal sacrococcygeal destruction or resection and foci of gas within the LEFT gluteus maximus question infection.  Looks to be NEW and due to infection progressing  Greatly appreciate  Dr. Eusebio Friendly help to take him to the OR for debridement cultures to help  [name] in ID if we  have DEEP CULTURES FROM BONE, TISSUE to help guide therapy esp give his hx of MDRO  restart vancomycin and follow cultures  #2 Recurrent pseudmonas in urine seems to have responded to fosfomycin and exchange  #3 Funguria: changing the catheter should eradicate this problem  #4 Pleural effusions: chronic , if he does nto improve after we target his osteomyelitis could consider tapping effusion for cell coutn diff, ldh and cutlures  Dr. Johnnye Sima available over the weekend for questions.         LOS: 26 days   [name] 08/05/2015, 5:44 PM

## 2015-08-05 NOTE — Progress Notes (Signed)
ANTIBIOTIC CONSULT NOTE - INITIAL  Pharmacy Consult for Vancomycin Indication: Osteomyelitis  Allergies  Allergen Reactions  . Cefuroxime Axetil Anaphylaxis  . Ertapenem Other (See Comments)    Rash and confusion-->tolerated Imipenem   . Morphine And Related Other (See Comments)    Changed mental status, confusion, headache, visual hallucination  . Penicillins Anaphylaxis and Other (See Comments)    Tolerated Imipenem; no reaction to 7 day course of amoxicillin in 2015 Has patient had a PCN reaction causing immediate rash, facial/tongue/throat swelling, SOB or lightheadedness with hypotension: Yes Has patient had a PCN reaction causing severe rash involving mucus membranes or skin necrosis: No Has patient had a PCN reaction that required hospitalization Yes Has patient had a PCN reaction occurring within the last 10 years: No If all of the above answers are "NO", then may proceed with Cephalo  . Sulfa Antibiotics Anaphylaxis, Shortness Of Breath and Other (See Comments)  . Tessalon [Benzonatate] Anaphylaxis  . Shellfish Allergy Itching and Other (See Comments)    Took benadryl to alleviate reaction  . Nsaids Other (See Comments)    Risk of bleeding  . Miripirium Rash and Other (See Comments)    Change in mental status    Patient Measurements: Height: _0  (170.2 cm) Weight: 121 lb 4.1 oz (55 kg) IBW/kg (Calculated) : 66.1  Vital Signs: Temp: 100 F (37.8 C) (10/28 0616) Temp Source: Oral (10/28 0616) BP: 94/51 mmHg (10/28 0616) Pulse Rate: 89 (10/28 0731) Intake/Output from previous day: 10/27 0701 - 10/28 0700 In: 550 [I.V.:550] Out: 2950 [Urine:1950; Stool:1000] Intake/Output from this shift:    Labs:  Recent Labs  08/04/15 0419  WBC 15.6*  HGB 8.0*  PLT 844*  CREATININE 0.58*   Estimated Creatinine Clearance: 104.1 mL/min (by C-G formula based on Cr of 0.58). No results for input(s): VANCOTROUGH, VANCOPEAK, VANCORANDOM, GENTTROUGH, GENTPEAK, GENTRANDOM,  TOBRATROUGH, TOBRAPEAK, TOBRARND, AMIKACINPEAK, AMIKACINTROU, AMIKACIN in the last 72 hours.   Microbiology:   Medical History: Past Medical History  Diagnosis Date  . GERD (gastroesophageal reflux disease)   . Asthma   . Hx MRSA infection     on face  . Gastroparesis   . Diabetic neuropathy ([place])   . Seizures ([place])   . Family history of anesthesia complication     Pt mother can't have epidural procedures  . Dysrhythmia   . Pneumonia   . Arthritis   . Fibromyalgia   . 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 Type I diabetes mellitus ([place])     sees Dr. Loanne Drilling   . Syncope 02/16/2015    Medications:  Prescriptions prior to admission  Medication Sig Dispense Refill Last Dose  . acetaminophen (TYLENOL) 325 MG tablet Take 2 tablets (650 mg total) by mouth every 6 (six) hours as needed for mild pain, moderate pain, fever or headache.   07/07/2015  . albuterol (PROVENTIL) (2.5 MG/3ML) 0.083% nebulizer solution Take 3 mLs (2.5 mg total) by nebulization every 4 (four) hours as needed for wheezing or shortness of breath. 75 mL 3 07/10/2015 at 600  . baclofen (LIORESAL) 10 MG tablet Take 1-2 tablets (10-20 mg total) by mouth 3 (three) times daily. Takes 52m in morning and lunchtime and takes 239mat bedtime (Patient taking differently: 10-20 mg by PEG Tube route 3 (three) times daily. Take 1 tablet (10 mg) by mouth at 9am and 6pm, take 2 tablets (20 mg) daily at bedtime - 10pm) 120 each 5 07/10/2015 at 900  .  collagenase (SANTYL) ointment Apply 1 application topically daily. Apply to back and foot wound every M/W/F (Patient taking differently: Apply 1 application topically every Monday, Wednesday, and Friday. Apply topically to foot wound) 90 g 5 07/08/2015  . doxycycline (VIBRA-TABS) 100 MG tablet Take 1 tablet (100 mg total) by mouth 2 (two) times daily. (Patient taking differently: Take 100 mg by mouth 2 (two) times daily. 3 month course started approximately 07/01/15)  60 tablet 2 07/10/2015 at 1200  . Emollient (COLLAGEN EX) Apply 1 application topically daily. Apply daily to sacral/ischial wound, pack with dry kerlix, cover with abd gauze   07/10/2015 at Unknown time  . fentaNYL (DURAGESIC - DOSED MCG/HR) 50 MCG/HR Place 1 patch (50 mcg total) onto the skin every 3 (three) days. 10 patch 0 07/09/2015 at Unknown time  . glucagon (GLUCAGON EMERGENCY) 1 MG injection INJECT INTO MUSCLE ONCE AS NEEDED (Patient taking differently: Inject 1 mg into the muscle once as needed (low blood sugar). ) 1 kit 0 few days ago  . GLUCERNA ([place]) LIQD by PEG Tube route continuous. 70 ml/hr   07/10/2015 at Unknown time  . insulin aspart (NOVOLOG FLEXPEN) 100 UNIT/ML FlexPen Inject 0-10 Units into the skin 4 (four) times daily as needed for high blood sugar (CBG >200). Per sliding scale: CBG <200 0 units, 201-300 4 units, 301-400 6 units, >400 10 units   07/10/2015 at 1200  . insulin glargine (LANTUS) 100 UNIT/ML injection Inject 0.05 mLs (5 Units total) into the skin at bedtime. (Patient taking differently: Inject 15 Units into the skin at bedtime. ) 10 mL 11 07/09/2015 at 2200  . loratadine (CLARITIN) 10 MG tablet Place 1 tablet (10 mg total) into feeding tube at bedtime. (Patient taking differently: 10 mg by PEG Tube route at bedtime. )   07/09/2015 at 2200  . LORazepam (ATIVAN) 0.5 MG tablet Take 0.5 mg by mouth 3 (three) times daily as needed for anxiety.   07/05/2015  . midodrine (PROAMATINE) 10 MG tablet Take 2 tablets (20 mg total) by mouth 3 (three) times daily with meals. (Patient taking differently: 30 mg by PEG Tube route 3 (three) times daily with meals. 9am, 12pm, 6pm) 180 tablet 6 07/10/2015 at 1200  . mirtazapine (REMERON) 7.5 MG tablet Place 1 tablet (7.5 mg total) into feeding tube at bedtime. (Patient taking differently: 7.5 mg by PEG Tube route at bedtime as needed (sleep). ) 30 tablet 6 2 1/2 weeks ago  . Multiple Vitamin (MULTIVITAMIN WITH MINERALS) TABS tablet Place 1  tablet into feeding tube daily. (Patient taking differently: 1 tablet by PEG Tube route daily. )   07/10/2015 at 900  . ondansetron (ZOFRAN ODT) 8 MG disintegrating tablet Place 1 tablet (8 mg total) into feeding tube every 8 (eight) hours as needed for nausea or vomiting. (Patient taking differently: 8 mg by PEG Tube route every 8 (eight) hours as needed for nausea or vomiting. ) 60 tablet 5 07/06/2015  . Oxycodone HCl 20 MG TABS Take 1 tablet (20 mg total) by mouth every 6 (six) hours as needed. With dressing changes and for break through pain but NO MORE THAN 4 PER DAY (Patient taking differently: Take 20 mg by mouth every 6 (six) hours as needed (breakthrough pain). With dressing changes and for break through pain but NO MORE THAN 4 PER DAY) 120 tablet 0 07/10/2015 at am  . pantoprazole sodium (PROTONIX) 40 mg/20 mL PACK Place 20 mLs (40 mg total) into feeding tube daily. (Patient taking  differently: 40 mg by PEG Tube route daily. ) 30 each 6 07/09/2015 at 900  . pregabalin (LYRICA) 100 MG capsule Place 1 capsule (100 mg total) into feeding tube 3 (three) times daily. Take 100 mg by mouth in the morning, take 100 mg by mouth in the afternoon, take 100 mg by mouth in the evening and take 200 mg by mouth at bedtime. (Patient taking differently: 100-200 mg by PEG Tube route 4 (four) times daily. Take 1 capsule (100 mg) by mouth daily at 9am, 12pm, and 6pm, take 2 capsules (200 mg) daily at bedtime - 10pm) 150 capsule 4 07/10/2015 at 900  . ACCU-CHEK SMARTVIEW test strip Use to check blood sugar 4 times daily. 200 each 1 Taking  . ciprofloxacin (CIPRO) 500 MG tablet Take 1 tablet (500 mg total) by mouth 2 (two) times daily. (Patient not taking: Reported on 07/10/2015) 20 tablet 0 Not Taking at Unknown time  . insulin aspart (NOVOLOG) 100 UNIT/ML injection Inject 5 Units into the skin 3 (three) times daily with meals. (Patient not taking: Reported on 07/10/2015) 10 mL 11 Not Taking at Unknown time  . Insulin  Syringes, Disposable, U-100 1 ML MISC Dispense 29G x 1/2 inch, use as directed, diagnosis code E 10.41 100 each 1   . LORazepam (ATIVAN) 1 MG tablet Take 1 tablet (1 mg total) by mouth every 6 (six) hours as needed for anxiety. (Patient not taking: Reported on 07/10/2015) 120 tablet 2 Not Taking at Unknown time  . Nutritional Supplements (FEEDING SUPPLEMENT, VITAL AF 1.2 CAL,) LIQD Place 1,000 mLs into feeding tube continuous. (Patient not taking: Reported on 07/10/2015) 1000 mL 11 Not Taking at Unknown time   Assessment: AMS  07/10/2015 29 y.o. male with history of spinal cord injury with paraplegia who was recently admitted for MRSA and microaerophilic strep bacteremia with respiratory failure requiring trach. Patient was admitted for sepsis. There was concern about a sacral abscess. He has a sacral ulcer and has been debrided several times. Patient has a h/o MRSA on his face. Plan to start Vancomycin for osteo.  ID: Sacral ulcer with possibly osteo. WBC 15.6 elevated. Tmax 100.8. Tc 100. Previous blood cultures negative. Awaiting tissue culture. 10/28: I&D of sacral ulcer   Primaxin 10/2>>10/11 Vanco 10/2>10/11, 10/15>10/21, 10/23>10/24, 10/28>>  10/27: Sacral tissue fungal cx: No yeast or fungal elements seen. Pending>> 10/27: Tissue cultures>> 10/3 and 10/21 Urine>>Pseduomonas S amikacin (trxt with Fosfomycin)  Renal: Na 129, Scr 0.58 with CrCl >100, UOP 1.5.  Goal of Therapy:  Vancomycin trough 15-20  Plan:  Based on previous levels, start Vancomycin 748m IV q12 hrs. Vanco trough after 3-5 doses as steady state.   Cami Delawder S. RAlford Highland PharmD, BCPS Clinical Staff Pharmacist Pager [phone] REilene GhaziStillinger 08/05/2015,9:50 AM

## 2015-08-05 NOTE — Progress Notes (Signed)
PROGRESS NOTE    Jason [name] QAS:[id number] DOB: 19-Jul-1986 DOA: 07/10/2015 PCP: [name], MD  HPI/Brief narrative 29 y.o. 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 Patient was recently tapered off his IV antibiotics and was placed on doxycycline and Cipro 3 days prior to this admit for possible UTI.   In the ER patient UA shows features concerning for UTI - patient has chronic suprapubic catheter. Patient was also found to be hypothermic and hypotensive. Patient has had previous history of multiple debridement for chronic sacral decubitus which at this time looks to be healing. Patient also has history of chronic osteomyelitis. S/p biopsy by plastics   Assessment/Plan: Toxic metabolic encephalopathy Resolved - patient is presently alert and oriented  Sepsis due to MDR Pseudomonas Aeruginosa UTI ?  Vs decubitus/osteomyelitis - suprapubic catheter changed - ID and plastics following: CT Scan showed: Sacral decubitus ulcer with prior distal sacrococcygeal destruction or resection and foci of gas within the LEFT gluteus maximus question infection. - Dr. Migdalia Dk.s/p biopsy on 10/27- await culture . Still spiking fevers, but trending douwn. Medicaid has no LTAC coverage. Infectious disease ordered urine culture and resumed fosfomycin-- growing yeast and pseudomonas  Acute renal failure Due to sepsis - renal function has normalized  Hyponatremia  Mild, monitor  Hypomagnesemia  Replaced to goal of 2.0  Diabetes mellitus type 1 with hyperglycemia Decrease lantus for now- refused diabetic diet -calorie count and hold tube feeds-- nursing says patient eats continually  Sacral decubitus and left chest wall  decubitus Plastics/ID consulted  Chronic anemia Hgb initially dropped w/ volume expansion - s/p 2U PRBC - no evidence of acute blood loss - hemoglobin appears stable at this time   Chronic respiratory failure with trach and vent pulmonary critical care supposed to be managing vent - it appears the patient took himself off the vent 10/7 and has not been back on since - currently appears stable on 28% trach collar  - As per PCCM ELink note 10/3, it appears that patient is followed in OP trach clinic> will defer further trach decisions to OP follow up in that clinic. - Discussed with CCM M.D. on call 10/16: Agrees with outpatient follow-up with trach clinic and indicates that he may not be decannulated.   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 IVC filter placed 8/6  History of spinal canal injury with paraplegia  Severe malnutrition in context of chronic illness, Underweight On night time TF  DVT Prophylaxis: Lovenox Code Status: Full Family Communication: mom at bedside Disposition Plan:    Consultants:  PCCM   Infectious disease  Plastics- [place]  Procedures:  None  Antibiotics:  Imipenem 10/2 > 10/11  Vanc 10/2 > 10/11  Fosfomycin 10/6 >   Vancomycin 10/15 >   Subjective: Having some pain after surgery  Objective: Filed Vitals:   08/05/15 0200 08/05/15 0420 08/05/15 0616 08/05/15 0731  BP: 81/55  94/51   Pulse: 76 81 84 89  Temp: 98.7 F (37.1 C)  100 F (37.8 C)   TempSrc: Oral  Oral   Resp: 18 18 18 18   Height:      Weight:      SpO2: 96% 97% 97% 97%    Intake/Output Summary (Last 24 hours) at 08/05/15 1230 Last data  filed at 08/05/15 1055  Gross per 24 hour  Intake   1210 ml  Output   3250 ml  Net  -2040 ml   Filed Weights   08/01/15 0424 08/03/15 0500 08/04/15 0500  Weight: 54.2 kg (119 lb 7.8 oz) 53 kg (116 lb 13.5 oz) 55 kg (121 lb 4.1 oz)     Exam:  General exam: flatter today, not smiling Respiratory system: Clear. No increased work of  breathing. Cardiovascular system: S1 & S2 heard, RRR. No JVD, murmurs, gallops, clicks or pedal edema. Gastrointestinal system: Abdomen is nondistended, soft and nontender. Normal bowel sounds heard.- ostomy functioning   Data Reviewed: Basic Metabolic Panel:  Recent Labs Lab 07/30/15 0435 08/01/15 0541 08/02/15 0015 08/04/15 0419 08/05/15 0920  NA 134* 134* 131* 129*  --   K 5.1 4.8 4.8 4.5  --   CL 103 103 100* 95*  --   CO2 21* 23 23 26   --   GLUCOSE 160* 159* 217* 123* 508*  BUN 24* 16 11 12   --   CREATININE 1.03 0.76 0.66 0.58*  --   CALCIUM 8.9 8.8* 8.6* 8.5*  --    Liver Function Tests: No results for input(s): AST, ALT, ALKPHOS, BILITOT, PROT, ALBUMIN in the last 168 hours. No results for input(s): LIPASE, AMYLASE in the last 168 hours. No results for input(s): AMMONIA in the last 168 hours. CBC:  Recent Labs Lab 07/30/15 0435 08/02/15 0015 08/04/15 0419  WBC 16.6* 16.5* 15.6*  NEUTROABS 10.7*  --   --   HGB 9.7* 8.3* 8.0*  HCT 30.6* 27.2* 26.1*  MCV 86.7 86.1 85.6  PLT 747* 914* 844*   Cardiac Enzymes: No results for input(s): CKTOTAL, CKMB, CKMBINDEX, TROPONINI in the last 168 hours. BNP (last 3 results) No results for input(s): PROBNP in the last 8760 hours. CBG:  Recent Labs Lab 08/04/15 2155 08/05/15 0156 08/05/15 0332 08/05/15 0752 08/05/15 1134  GLUCAP 202* 277* 357* 475* 389*    Recent Results (from the past 240 hour(s))  Culture, blood (routine x 2)     Status: None   Collection Time: 07/27/15  7:05 AM  Result Value Ref Range Status   Specimen Description BLOOD RIGHT HAND  Final   Special Requests BOTTLES DRAWN AEROBIC ONLY 3CC  Final   Culture NO GROWTH 5 DAYS  Final   Report Status 08/01/2015 FINAL  Final  Culture, blood (routine x 2)     Status: None   Collection Time: 07/27/15  7:10 AM  Result Value Ref Range Status   Specimen Description BLOOD RIGHT ARM  Final   Special Requests BOTTLES DRAWN AEROBIC ONLY 3CC  Final    Culture NO GROWTH 5 DAYS  Final   Report Status 08/01/2015 FINAL  Final  Culture, Urine     Status: None (Preliminary result)   Collection Time: 07/29/15  6:06 PM  Result Value Ref Range Status   Specimen Description URINE, SUPRAPUBIC  Final   Special Requests Normal  Final   Culture   Final    >=100,000 COLONIES/mL PSEUDOMONAS AERUGINOSA AMIKACIN MIC=8 SENSITIVE >=100,000 COLONIES/mL YEAST    Report Status PENDING  Incomplete   Organism ID, Bacteria PSEUDOMONAS AERUGINOSA  Final      Susceptibility   Pseudomonas aeruginosa - MIC*    CEFTAZIDIME 16 INTERMEDIATE Intermediate     CIPROFLOXACIN >=4 RESISTANT Resistant     GENTAMICIN >=16 RESISTANT Resistant     IMIPENEM >=16 RESISTANT Resistant     CEFEPIME 16  INTERMEDIATE Intermediate     * >=100,000 COLONIES/mL PSEUDOMONAS AERUGINOSA  Anaerobic culture     Status: None (Preliminary result)   Collection Time: 08/04/15  7:08 PM  Result Value Ref Range Status   Specimen Description TISSUE LEFT SACRAL  Final   Special Requests NONE  Final   Gram Stain   Final    NO WBC SEEN NO ORGANISMS SEEN Performed at [facility]    Culture PENDING  Incomplete   Report Status PENDING  Incomplete  Fungus Culture with Smear     Status: None (Preliminary result)   Collection Time: 08/04/15  7:11 PM  Result Value Ref Range Status   Specimen Description TISSUE LEFT SACRAL  Final   Special Requests NONE  Final   Fungal Smear   Final    NO YEAST OR FUNGAL ELEMENTS SEEN Performed at [facility]    Culture   Final    CULTURE IN PROGRESS FOR FOUR WEEKS Performed at [facility]    Report Status PENDING  Incomplete  Tissue culture     Status: None (Preliminary result)   Collection Time: 08/04/15  7:18 PM  Result Value Ref Range Status   Specimen Description TISSUE LEFT SACRAL  Final   Special Requests NONE  Final   Gram Stain   Final    NO WBC SEEN NO ORGANISMS SEEN Performed at [facility]    Culture  PENDING  Incomplete   Report Status PENDING  Incomplete           Studies: No results found.      Scheduled Meds: . baclofen  10 mg Oral 2 times per day  . baclofen  20 mg Oral QHS  . collagenase   Topical Daily  . enoxaparin (LOVENOX) injection  40 mg Subcutaneous Daily  . fentaNYL  50 mcg Transdermal Q72H  . Influenza vac split quadrivalent PF  0.5 mL Intramuscular Tomorrow-1000  . insulin aspart  0-15 Units Subcutaneous TID WC  . insulin aspart  0-5 Units Subcutaneous QHS  . [START ON 08/06/2015] insulin glargine  15 Units Subcutaneous Daily  . midodrine  30 mg Oral TID WC  . multivitamin with minerals  1 tablet Oral Daily  . pantoprazole sodium  40 mg Oral Daily  . pregabalin  100 mg Oral 3 times per day  . pregabalin  200 mg Oral QHS  . vancomycin  750 mg Intravenous Q12H   Continuous Infusions: . sodium chloride 10 mL/hr at 08/05/15 1038  . 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  Eulogio Bear, DO [facility]  [email or web address] Password [email or web address] 08/05/2015, 12:30 PM    LOS: 26 days

## 2015-08-05 NOTE — Progress Notes (Signed)
Calorie Count Note  48 hour calorie count ordered by MD.  S/p Procedure(s) (LRB) on 08/04/15: IRRIGATION AND DEBRIDEMENT OF SACRAL ULCER AND (N/A) APPLICATION OF A-CELL (N/A)  Reviewed DM coordinator note. Pt experienced hypoglycemia on 08/04/15, likely due to NPO status. Pt experienced hyperglycemia this morning; DM coordinator has provided insulin adjustment recommendations.  Pt with multiple wounds, including:   Left plantar heel with stage 2 pressure injury Left plantar foot near heel with unstageable pressure injury Left anterior foot near small toe stage 2 pressure injury  Left outer foot stage 2 pressure injury  Right ischium with stage 4 pressure injury Left ischium with stage 4 pressure injury Left hip with stage 4 pressure injury Left posterior back with chronic full thickness wound  Diet: Regular Supplements: nocturnal feeding regimen of Vital AF 1.2 via PEG (6 PM to 6 AM) providing 864 kcals, 54 gm protein, 584 ml of free water  Estimated Nutritional Needs: Kcals: 1900-2100 Protein: 95-110 grams Fluid: >1.9 L  Nutrition Dx: Inadequate oral intake related to chronic illness as evidenced by  (PO intake </= 50%); ogoing  Goal: Patient will meet greater than or equal to 90% of their needs; progressing  Intervention:    Continue MVI  Continue regular diet  Continue Vital AF 1.2 formula at 60 ml/hr from 6 PM to 6 AM. Total time is 12 hours.  RD will follow-up on Monday, 08/08/15, with calorie count results.   Leobardo Granlund [name], RD, LDN, CDE Pager: [phone] After hours Pager: [phone]

## 2015-08-05 NOTE — Care Management Note (Signed)
Case Management Note  Patient Details  Name: Jason [name] MRN: [id number] Date of Birth: July 19, 1986  Subjective/Objective:                    Action/Plan:  Spoke to patient and his mother at bedside. Patient wants to return home at discharge with home health through [facility] and supplies [facility]   .   Mother in agreement .   Updated and faxed information to [name] at [facility] , confirmed receipt of fax .   Updated Kim at [facility] , Maudie Mercury stated she does not need any new information or orders , trach supplies and tube feeds are already ordered .   Patient already has specialized bed and all equipment needed at home.    Expected Discharge Date:                  Expected Discharge Plan:  [place]  In-House Referral:     Discharge planning Services  CM Consult  Post Acute Care Choice:    Choice offered to:     DME Arranged:    DME Agency:     HH Arranged:  RN [place] Agency:  [facility]  Status of Service:  In process, will continue to follow  Medicare Important Message Given:    Date Medicare IM Given:    Medicare IM give by:    Date Additional Medicare IM Given:    Additional Medicare Important Message give by:     If discussed at [place] of Stay Meetings, dates discussed:    Additional Comments:  Marilu Favre, RN 08/05/2015, 3:45 PM

## 2015-08-05 NOTE — Progress Notes (Signed)
Inpatient Diabetes Program Recommendations  AACE/ADA: New Consensus Statement on Inpatient Glycemic Control (2015)  Target Ranges:  Prepandial:   less than 140 mg/dL      Peak postprandial:   less than 180 mg/dL (1-2 hours)      Critically ill patients:  140 - 180 mg/dL    Results for Jason [name], Jason [name] (MRN [id number]) as of 08/05/2015 10:48  Ref. Range 08/04/2015 08:03 08/04/2015 08:27 08/04/2015 12:03 08/04/2015 12:38 08/04/2015 17:18 08/04/2015 18:38 08/04/2015 19:13 08/04/2015 21:55  Glucose-Capillary Latest Ref Range: 65-99 mg/dL 58 (L) 106 (H) 59 (L) 98 48 (L) 68 99 202 (H)    Results for Jason [name], Jason [name] (MRN [id number]) as of 08/05/2015 10:48  Ref. Range 08/05/2015 01:56 08/05/2015 03:32 08/05/2015 07:52  Glucose-Capillary Latest Ref Range: 65-99 mg/dL 277 (H) 357 (H) 475 (H)     Current Insulin Orders: Lantus 20 units daily      Novolog Moderate SSI (0-15 units) TID AC + HS    -Note patient's nocturnal tube feeds were turned off at midnight on 10/27 in anticipation of surgery for I&D.  -Patient with Hypoglycemia all day on 10/27.  Still received Lantus 20 units daily yesterday.    -Now having extreme hyperglycemia this morning (CBG 475 mg/dl).  -I suspect that patient had Hypoglycemia yesterday due to the Lantus dose that he had on board.  I wonder if this dose of Lantus is covering both patient's basal needs along with the tube feeds he gets at night.     MD- Recommend the following in-hospital insulin adjustments:  1. Decrease Lantus to 15 daily  2. Change Novolog Moderate SSI to Q4 hour coverage (currently ordered as TID AC + HS) so patient receives Novolog SSI coverage throughout the night      --Will follow patient during hospitalization--  Wyn Quaker RN, MSN, CDE Diabetes Coordinator Inpatient Glycemic Control Team Team Pager: [phone] (8a-5p)

## 2015-08-06 DIAGNOSIS — R4020 Unspecified coma: Secondary | ICD-10-CM

## 2015-08-06 LAB — GLUCOSE, CAPILLARY
GLUCOSE-CAPILLARY: 128 mg/dL — AB (ref 65–99)
GLUCOSE-CAPILLARY: 74 mg/dL (ref 65–99)
GLUCOSE-CAPILLARY: 175 mg/dL — AB (ref 65–99)
Glucose-Capillary: 94 mg/dL (ref 65–99)
Glucose-Capillary: 93 mg/dL (ref 65–99)
Glucose-Capillary: 106 mg/dL — ABNORMAL HIGH (ref 65–99)

## 2015-08-06 MED ORDER — VITAL 1.0 CAL PO LIQD
1000.0000 mL | ORAL | Status: DC
  Administered 2015-08-06 – 2015-08-09 (×3): 1000 mL via ORAL
  Filled 2015-08-06 (×8): qty 1500

## 2015-08-06 NOTE — Progress Notes (Signed)
[facility]                                                                              Patient Demographics  Jason [name], is a 29 y.o. male, DOB - Feb 18, 1986, WER:[id number]  Admit date - 07/10/2015   Admitting Physician [name], MD  Outpatient Primary MD for the patient is [name], MD  LOS - [phone]   Chief Complaint  Patient presents with  . Altered Mental Status       Brief HPI   29 y.o. 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 Patient was recently tapered off his IV antibiotics and was placed on doxycycline and Cipro 3 days prior to this admit for possible UTI.   In the ER patient UA shows features concerning for UTI - patient has chronic suprapubic catheter. Patient was also found to be hypothermic and hypotensive. Patient has had previous history of multiple debridement for chronic sacral decubitus which at this time looks to be healing. Patient also has history of chronic osteomyelitis. S/p biopsy by plastics  Assessment & Plan   Principal problem Toxic metabolic encephalopathy Resolved - patient is presently alert and oriented  Acute problem Sepsis due to MDR Pseudomonas Aeruginosa UTI ? Vs decubitus/osteomyelitis - suprapubic catheter changed by urology - ID and plastics following: CT Scan showed: Sacral decubitus ulcer with prior distal sacrococcygeal destruction or resection and foci of gas within the LEFT gluteus maximus question infection. - Dr. Migdalia Dk.s/p biopsy on 10/27- cultures negative so far.  -  Still spiking fevers, but trending douwn. Infectious disease ordered urine culture and resumed fosfomycin-- growing yeast and pseudomonas  Acute renal failure Due  to sepsis -resolved  Hyponatremia  Mild, improving  Diabetes mellitus type 1 with hyperglycemia - Decrease lantus for now- refused diabetic diet - Patient and family upset about holding tube feeds although patient has been eating normally.   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 Plastics/ID consulted  Chronic anemia H&H currently stable   Chronic respiratory failure with trach and vent pulmonary critical care supposed to be managing vent - it appears the patient took himself off the vent 10/7 and has not been back on since - currently appears stable on 28% trach collar  - As per PCCM ELink note 10/3, it appears that patient is followed in OP trach clinic> will defer further trach decisions to OP follow up in that clinic. - Dr. Eliseo Squires discussed with CCM M.D. on call 10/16: Agrees with outpatient follow-up with trach clinic and indicates that he may not be decannulated.   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 IVC filter placed 8/6  History of spinal cord injury with paraplegia  Severe malnutrition in context of chronic illness, Underweight On night time TF  DVT Prophylaxis: Lovenox   Code Status: Full CODE STATUS  Family Communication: Discussed in detail with the patient, all imaging results, lab results explained to  the patient, parents at the bedside   Disposition Plan: Medicaid has no LTAC coverage  Time Spent in minutes  25 minutes  Procedures  None  Consults    PCCM   Infectious disease  Plastics- [place]   Medications  Scheduled Meds: . baclofen  10 mg Oral 2 times per day  . baclofen  20 mg Oral QHS  . collagenase   Topical Daily  . enoxaparin (LOVENOX) injection  40 mg Subcutaneous Daily  . fentaNYL  50 mcg Transdermal Q72H  . Influenza vac split quadrivalent PF  0.5 mL Intramuscular Tomorrow-1000  . insulin aspart  0-15 Units Subcutaneous TID WC  . insulin aspart  0-5 Units Subcutaneous QHS  . insulin aspart  3 Units Subcutaneous TID WC  . insulin  glargine  15 Units Subcutaneous Daily  . midodrine  30 mg Oral TID WC  . multivitamin with minerals  1 tablet Oral Daily  . pantoprazole sodium  40 mg Oral Daily  . pregabalin  100 mg Oral 3 times per day  . pregabalin  200 mg Oral QHS  . vancomycin  750 mg Intravenous Q12H   Continuous Infusions: . sodium chloride 10 mL/hr at 08/05/15 1038  . lactated ringers 10 mL/hr at 08/04/15 1721   PRN Meds:.acetaminophen (TYLENOL) oral liquid 160 mg/5 mL, albuterol, diphenhydrAMINE-zinc acetate, LORazepam, mirtazapine, ondansetron **OR** ondansetron (ZOFRAN) IV, oxyCODONE, RESOURCE THICKENUP CLEAR   Antibiotics   Anti-infectives    Start     Dose/Rate Route Frequency Ordered Stop   08/05/15 1100  vancomycin (VANCOCIN) IVPB 750 mg/150 ml premix     750 mg 150 mL/hr over 60 Minutes Intravenous Every 12 hours 08/05/15 1015     08/01/15 1000  fluconazole (DIFLUCAN) tablet 200 mg  Status:  Discontinued     200 mg Oral Daily 08/01/15 0839 08/01/15 1342   07/31/15 1500  vancomycin (VANCOCIN) IVPB 750 mg/150 ml premix  Status:  Discontinued     750 mg 150 mL/hr over 60 Minutes Intravenous Every 48 hours 07/31/15 1322 08/01/15 1342   07/27/15 1800  vancomycin (VANCOCIN) 500 mg in sodium chloride 0.9 % 100 mL IVPB     500 mg 100 mL/hr over 60 Minutes Intravenous Every 12 hours 07/27/15 0048 07/29/15 1846   07/23/15 2300  vancomycin (VANCOCIN) IVPB 750 mg/150 ml premix  Status:  Discontinued     750 mg 150 mL/hr over 60 Minutes Intravenous Every 24 hours 07/23/15 2215 07/27/15 0048   07/18/15 0000  vancomycin (VANCOCIN) IVPB 750 mg/150 ml premix     750 mg 150 mL/hr over 60 Minutes Intravenous Every 24 hours 07/17/15 0508 07/19/15 0123   07/17/15 0100  vancomycin (VANCOCIN) 500 mg in sodium chloride 0.9 % 100 mL IVPB  Status:  Discontinued     500 mg 100 mL/hr over 60 Minutes Intravenous Every 24 hours 07/16/15 1016 07/17/15 0507   07/13/15 2000  imipenem-cilastatin (PRIMAXIN) 250 mg in sodium  chloride 0.9 % 100 mL IVPB     250 mg 200 mL/hr over 30 Minutes Intravenous 3 times per day 07/13/15 1046 07/19/15 2256   07/11/15 2200  imipenem-cilastatin (PRIMAXIN) 250 mg in sodium chloride 0.9 % 100 mL IVPB  Status:  Discontinued     250 mg 200 mL/hr over 30 Minutes Intravenous Every 12 hours 07/11/15 1020 07/13/15 1046   07/10/15 2230  imipenem-cilastatin (PRIMAXIN) 250 mg in sodium chloride 0.9 % 100 mL IVPB  Status:  Discontinued     250 mg 200 mL/hr  over 30 Minutes Intravenous 3 times per day 07/10/15 2226 07/11/15 1020   07/10/15 2230  vancomycin (VANCOCIN) 500 mg in sodium chloride 0.9 % 100 mL IVPB  Status:  Discontinued     500 mg 100 mL/hr over 60 Minutes Intravenous Every 24 hours 07/10/15 2226 07/16/15 1016        Subjective:   [employer] was seen and examined today. Still spiking low grade fever. Patient denies dizziness, chest pain, shortness of breath, abdominal pain, N/V/D/C, new weakness, numbess, tingling.     Objective:   Blood pressure 100/66, pulse 84, temperature 100.9 F (38.3 C), temperature source Oral, resp. rate 16, height 5\' 7"  (1.702 m), weight 54 kg (119 lb 0.8 oz), SpO2 98 %.  Wt Readings from Last 3 Encounters:  08/05/15 54 kg (119 lb 0.8 oz)  07/05/15 44.906 kg (99 lb)  06/01/15 50.349 kg (111 lb)     Intake/Output Summary (Last 24 hours) at 08/06/15 1224 Last data filed at 08/06/15 0841  Gross per 24 hour  Intake  908.5 ml  Output   3200 ml  Net -2291.5 ml    Exam  General: Alert and oriented x 3, NAD  HEENT:  PERRLA, EOMI,   Neck: Trach+  CVS: S1 S2 auscultated, no rubs, murmurs or gallops. Regular rate and rhythm.  Respiratory: Decreased breath sounds at the bases  Abdomen: Soft, nontender, nondistended, + bowel sounds  Ext: no cyanosis clubbing or edema  Neuro: quadriparesis  Skin: No rashes  Psych: Normal affect and demeanor, alert and oriented x3    Data Review   Micro Results Recent Results (from the  past 240 hour(s))  Culture, Urine     Status: None (Preliminary result)   Collection Time: 07/29/15  6:06 PM  Result Value Ref Range Status   Specimen Description URINE, SUPRAPUBIC  Final   Special Requests Normal  Final   Culture   Final    >=100,000 COLONIES/mL PSEUDOMONAS AERUGINOSA AMIKACIN MIC=8 SENSITIVE >=100,000 COLONIES/mL YEAST    Report Status PENDING  Incomplete   Organism ID, Bacteria PSEUDOMONAS AERUGINOSA  Final      Susceptibility   Pseudomonas aeruginosa - MIC*    CEFTAZIDIME 16 INTERMEDIATE Intermediate     CIPROFLOXACIN >=4 RESISTANT Resistant     GENTAMICIN >=16 RESISTANT Resistant     IMIPENEM >=16 RESISTANT Resistant     CEFEPIME 16 INTERMEDIATE Intermediate     * >=100,000 COLONIES/mL PSEUDOMONAS AERUGINOSA  Anaerobic culture     Status: None (Preliminary result)   Collection Time: 08/04/15  7:08 PM  Result Value Ref Range Status   Specimen Description TISSUE LEFT SACRAL  Final   Special Requests NONE  Final   Gram Stain   Final    NO WBC SEEN NO ORGANISMS SEEN Performed at [facility]    Culture PENDING  Incomplete   Report Status PENDING  Incomplete  Fungus Culture with Smear     Status: None (Preliminary result)   Collection Time: 08/04/15  7:11 PM  Result Value Ref Range Status   Specimen Description TISSUE LEFT SACRAL  Final   Special Requests NONE  Final   Fungal Smear   Final    NO YEAST OR FUNGAL ELEMENTS SEEN Performed at [facility]    Culture   Final    CULTURE IN PROGRESS FOR FOUR WEEKS Performed at [facility]    Report Status PENDING  Incomplete  AFB culture with smear     Status: None (  Preliminary result)   Collection Time: 08/04/15  7:13 PM  Result Value Ref Range Status   Specimen Description TISSUE LEFT SACRAL  Final   Special Requests NONE  Final   Acid Fast Smear   Final    NO ACID FAST BACILLI SEEN Performed at [facility]    Culture   Final    CULTURE WILL BE EXAMINED FOR 6 WEEKS  BEFORE ISSUING A FINAL REPORT Performed at [facility]    Report Status PENDING  Incomplete  Tissue culture     Status: None (Preliminary result)   Collection Time: 08/04/15  7:18 PM  Result Value Ref Range Status   Specimen Description TISSUE LEFT SACRAL  Final   Special Requests NONE  Final   Gram Stain   Final    NO WBC SEEN NO ORGANISMS SEEN Performed at [facility]    Culture   Final    Culture reincubated for better growth Performed at [facility]    Report Status PENDING  Incomplete    Radiology Reports Ct Abdomen Pelvis Wo Contrast  07/11/2015  CLINICAL DATA:  Sepsis. EXAM: CT ABDOMEN AND PELVIS WITHOUT CONTRAST TECHNIQUE: Multidetector CT imaging of the abdomen and pelvis was performed following the standard protocol without IV contrast. COMPARISON:  CT scan of pelvis of May 02, 2015; CT scan of abdomen pelvis December 11, 2014. FINDINGS: Bilateral posterior basilar opacity is are noted, right greater than left, concerning for possible pneumonia or subsegmental atelectasis. Gastrostomy tube is in grossly good position. IVC filter is noted in infrarenal position. No gallstones are noted. No focal abnormality is noted in the liver, spleen or pancreas on these unenhanced images. Adrenal glands appear normal. Mild left hydronephrosis is noted secondary to 3 mm calculus in the proximal left ureter. Multiple small nonobstructive calculi are noted in right kidney. There is no evidence of bowel obstruction. Ostomy is noted in left lower quadrant. Foley catheter is noted within the urinary bladder. Wall thickening of the urinary bladder is noted which may simply represent lack of distension, but cystitis cannot be excluded. Mild anasarca is noted. Minimal free fluid is noted in the dependent portion of the pelvis. Large sacral decubitus ulcer is noted which is significantly increased in size. There has been surgical resection of the distal sacrum and coccyx. Another  large sacral ulceration is seen extending to the right ischial tuberosity which is unchanged compared to prior exam. No lytic destruction is seen at this time to suggest acute osteomyelitis. IMPRESSION: Nonobstructive right nephrolithiasis. Mild left hydronephrosis is noted secondary to 3 mm calculus seen in the proximal left ureter. Bilateral posterior basilar lung opacities are noted, right greater than left, concerning for pneumonia or possibly atelectasis. Mild anasarca. Foley catheter is present within the urinary bladder. Urinary bladder wall thickening is noted which may be due to lack of distension, but cystitis cannot be excluded. Stable decubitus ulcer seen in right-sided pelvis extending to ischial tuberosity. Large sacral decubitus ulcer is noted which is increased in size compared to prior exam. However, no lytic destruction is seen to suggest active osteomyelitis at this time. Electronically Signed   By: Marijo Conception, M.D.   On: 07/11/2015 14:16   Ct Head Wo Contrast  07/11/2015  CLINICAL DATA:  Acute encephalopathy, probable sepsis from UTI, paraplegic secondary to spinal cord injury, asthma, type I diabetes mellitus, stroke EXAM: CT HEAD WITHOUT CONTRAST TECHNIQUE: Contiguous axial images were obtained from the base of the skull through  the vertex without intravenous contrast. COMPARISON:  01/06/2015 FINDINGS: Normal ventricular morphology. No midline shift or mass effect. Normal appearance of brain parenchyma. No intracranial hemorrhage, mass lesion, or acute infarction. Visualized paranasal sinuses and mastoid air cells clear. Bones unremarkable. IMPRESSION: No acute intracranial abnormalities. Electronically Signed   By: Lavonia Dana M.D.   On: 07/11/2015 14:01   Ct Abdomen Pelvis W Contrast  07/25/2015  CLINICAL DATA:  GERD fever of unknown origin, recent MRSA infection, strep bacteremia, respiratory failure, admitted with hallucinations and confusion, question UTI ; history spinal cord  injury and paraplegia, type I diabetes mellitus EXAM: CT ABDOMEN AND PELVIS WITH CONTRAST TECHNIQUE: Multidetector CT imaging of the abdomen and pelvis was performed using the standard protocol following bolus administration of intravenous contrast. Sagittal and coronal MPR images reconstructed from axial data set. CONTRAST:  138mL OMNIPAQUE IOHEXOL 300 MG/ML SOLN IV. Dilute oral contrast. COMPARISON:  07/11/2015 FINDINGS: Infiltrate RIGHT lower lobe. Mild atelectasis LEFT lower lobe. Peribronchial thickening in both lower lobes with RIGHT lower lobe bronchiectasis. Contracted gallbladder. Scarring at medial aspect of inferior pole RIGHT kidney unchanged. Liver, spleen, pancreas, kidneys, and adrenal glands otherwise unremarkable. IVC filter and gastrostomy tube. Normal appendix. Question bowel wall thickening involving the cecum and ascending colon versus artifact from underdistention. LEFT lower quadrant descending colostomy. Stomach and remaining bowel loops unremarkable. Bladder wall thickening, question related to chronic suprapubic catheterization in urinary tract infection versus chronic outlet obstruction. LEFT ureter distended though this could be related to bladder distention. Vascular structures grossly patent. Large sacral decubitus ulcer with prior distal sacrococcygeal destruction or resection. Foci of soft tissue gas within LEFT gluteus maximus question penetration by decubitus ulcer and infection. Diffuse soft tissue edema. Additional foci of nonspecific soft tissue gas RIGHT lateral abdomen. No mass, adenopathy, free air or free fluid. Bones demineralized. IMPRESSION: Questionable wall thickening of the cecum and ascending colon versus artifact from underdistention, unable to exclude colitis. Diffuse bladder wall thickening which may be related to chronic catheterization/infection or outlet obstruction. Diffuse soft tissue edema which can be seen with anasarca and hypoproteinemia. Peribronchial  thickening in both lower lobes with RIGHT lower lobe bronchiectasis and RIGHT lower lobe infiltrate. Sacral decubitus ulcer with prior distal sacrococcygeal destruction or resection and foci of gas within the LEFT gluteus maximus question infection. Additional nonspecific foci of soft tissue gas in the RIGHT lateral abdominal wall soft tissue planes. Electronically Signed   By: Lavonia Dana M.D.   On: 07/25/2015 17:46   Dg Chest Port 1 View  07/27/2015  CLINICAL DATA:  Tracheostomy and fever EXAM: PORTABLE CHEST 1 VIEW COMPARISON:  07/16/2015 FINDINGS: Tracheostomy tube in unchanged position over the tracheal air column. Heart size and vascular pattern are normal. There are mild bilateral lower lobe infiltrates. When compared to the prior study there both significantly improved. No pleural effusion. Stable mild expansile deformity distal right clavicle possibly due to prior trauma. IMPRESSION: Persistent but improved bilateral lower lobe infiltrates. Electronically Signed   By: Skipper Cliche M.D.   On: 07/27/2015 08:09   Dg Chest Port 1 View  07/16/2015  CLINICAL DATA:  Pneumonia. EXAM: PORTABLE CHEST 1 VIEW COMPARISON:  July 13, 2015. FINDINGS: The heart size and mediastinal contours are within normal limits. Tracheostomy is unchanged in position. No pneumothorax is noted. Increased bibasilar opacities are noted concerning for edema or pneumonia. Minimal right pleural effusion is noted. The visualized skeletal structures are unremarkable. IMPRESSION: Increased bibasilar opacities are noted concerning for worsening edema or pneumonia.  Minimal right pleural effusion is noted. Electronically Signed   By: Marijo Conception, M.D.   On: 07/16/2015 08:29   Dg Chest Port 1 View  07/13/2015  CLINICAL DATA:  Follow-up of respiratory failure EXAM: PORTABLE CHEST 1 VIEW COMPARISON:  Portable chest x-ray of July 10, 2015 FINDINGS: The lungs are adequately inflated. The interstitial markings are slightly more  conspicuous today especially in the infrahilar regions bilaterally greater on the left than on the right. There is no pleural effusion or pneumothorax. The heart and pulmonary vascularity are normal. The tracheostomy appliance tip projects just inferior to the inferior margins of the clavicular heads. IMPRESSION: Interval development of subsegmental atelectasis in the infrahilar regions bilaterally. There is no alveolar infiltrate nor evidence of pulmonary edema. Electronically Signed   By: David  Martinique M.D.   On: 07/13/2015 07:46   Dg Chest Port 1 View  07/10/2015  CLINICAL DATA:  Altered mental status. History of asthma, pneumonia, stroke, diabetes EXAM: PORTABLE CHEST 1 VIEW COMPARISON:  05/30/2015 FINDINGS: There is a tracheostomy tube with the tip 3.5 cm above the carina. There is no focal parenchymal opacity. There is no pleural effusion or pneumothorax. The heart and mediastinal contours are unremarkable. There is an old healed right clavicular fracture. IMPRESSION: No active disease. Electronically Signed   By: Kathreen Devoid   On: 07/10/2015 17:29   Dg Swallowing Func-speech Pathology  07/20/2015  Objective Swallowing Evaluation:   Patient Details Name: Jason [name] MRN: [id number] Date of Birth: Mar 27, 1986 Today's Date: 07/20/2015 Time: SLP Start Time (ACUTE ONLY): 1400-SLP Stop Time (ACUTE ONLY): 1420 SLP Time Calculation (min) (ACUTE ONLY): 20 min Past Medical History: Past Medical History Diagnosis Date . GERD (gastroesophageal reflux disease)  . Asthma  . Hx MRSA infection    on face . Gastroparesis  . Diabetic neuropathy ([place])  . Seizures ([place])  . Family history of anesthesia complication    Pt mother can't have epidural procedures . Dysrhythmia  . Pneumonia  . Arthritis  . Fibromyalgia  . Stroke ([place]) 01/29/2014   spinal stroke ; "quadriplegic since" . Type I diabetes mellitus ([place])    sees Dr. Loanne Drilling  . Syncope 02/16/2015 Past Surgical History: Past Surgical History Procedure Laterality Date  . Tonsillectomy   . Multiple extractions with alveoloplasty N/A 08/03/2014   Procedure: MULTIPLE EXTRACTIONS;  Surgeon: Gae Bon, DDS;  Location: [place];  Service: Oral Surgery;  Laterality: N/A; . Tee without cardioversion N/A 08/17/2014   Procedure: TRANSESOPHAGEAL ECHOCARDIOGRAM (TEE);  Surgeon: Dorothy Spark, MD;  Location: [facility] ENDOSCOPY;  Service: Cardiovascular;  Laterality: N/A; . Debridment of decubitus ulcer N/A 10/04/2014   Procedure: DEBRIDMENT OF DECUBITUS ULCER;  Surgeon: Georganna Skeans, MD;  Location: [place];  Service: General;  Laterality: N/A; . Laparoscopic diverted colostomy N/A 10/12/2014   Procedure: LAPAROSCOPIC DIVERTING COLOSTOMY;  Surgeon: Donnie Mesa, MD;  Location: [place];  Service: General;  Laterality: N/A; . Insertion of suprapubic catheter N/A 10/12/2014   Procedure: INSERTION OF SUPRAPUBIC CATHETER;  Surgeon: Reece Packer, MD;  Location: [place];  Service: Urology;  Laterality: N/A; . Incision and drainage of wound N/A 11/12/2014   Procedure: IRRIGATION AND DEBRIDEMENT OF WOUNDS WITH BONE BIOPSY AND SURGICAL PREP ;  Surgeon: Theodoro Kos, DO;  Location: [place];  Service: Plastics;  Laterality: N/A; . Application of a-cell of back N/A 11/12/2014   Procedure: APPLICATION A CELL AND VAC ;  Surgeon: Theodoro Kos, DO;  Location: [place];  Service: Plastics;  Laterality: N/A; . Incision and drainage of wound N/A 11/18/2014   Procedure: IRRIGATION AND DEBRIDEMENT OF SACRAL ULCER ONLY WITH PLACEMENT OF A CELL AND VAC/ DRESSING CHANGE TO UPPER BACK AREA.;  Surgeon: Theodoro Kos, DO;  Location: [place];  Service: Plastics;  Laterality: N/A; . Incision and drainage of wound N/A 11/25/2014   Procedure: IRRIGATION AND DEBRIDEMENT OF SACRAL ULCER AND BACK BURN WITH PLACEMENT OF A-CELL;  Surgeon: Theodoro Kos, DO;  Location: [place];  Service: Plastics;  Laterality: N/A; . Minor application of wound vac N/A 11/25/2014   Procedure:  WOUND VAC CHANGE;  Surgeon: Theodoro Kos, DO;  Location: [place];   Service: Plastics;  Laterality: N/A; . Incision and drainage of wound Right 12/08/2014   Procedure: IRRIGATION AND DEBRIDEMENT SACRAL WOUND AND RIGHT ISCHIAL WOUND ;  Surgeon: Theodoro Kos, DO;  Location: [place];  Service: Plastics;  Laterality: Right; . Application of a-cell of back N/A 12/08/2014   Procedure: APPLICATION OF A-CELL AND WOUND VAC ;  Surgeon: Theodoro Kos, DO;  Location: [place];  Service: Plastics;  Laterality: N/A; . Tee without cardioversion N/A 05/05/2015   Procedure: TRANSESOPHAGEAL ECHOCARDIOGRAM (TEE);  Surgeon: Lelon Perla, MD;  Location: [place];  Service: Cardiovascular;  Laterality: N/A; . Incision and drainage of wound Bilateral 05/05/2015   Procedure: IRRIGATION AND DEBRIDEMENT SACRAL ULCER;  Surgeon: Theodoro Kos, DO;  Location: [place];  Service: Plastics;  Laterality: Bilateral; . Hematoma evacuation N/A 05/05/2015   Procedure: EVACUATION HEMATOMA bedside procedure;  Surgeon: Theodoro Kos, DO;  Location: [name];  Service: Plastics;  Laterality: N/A; HPI: Other Pertinent Information: 29 y.o. male with history of spinal cord injury status post paraplegia who was recently admitted for MRSA and microaerophilic strep bacteremia with respiratory failure requiring trach; nocturnal vent. Admitted due to AMS. Found to have UTI, hypothermia, hypotension, toxic metabolic encephalopathy. In August 2016 pt seen for in-line Passy-Muir Speaking valve, trach'd and continued with PMSV. MBS 05/2015 recommended NPO, received PEG. MBS 10/7 with recs for dysphagia 2, honey thick secondary to silent, deep penetration nectars; mild oral phase deficits.  Pt has been tolerating current diet with valve in place for all POs.  No Data Recorded Assessment / Plan / Recommendation CHL IP CLINICAL IMPRESSIONS 07/20/2015 Therapy Diagnosis WFL Clinical Impression Repeat MBS completed with PMV in place.  Pt with much improved swallow function marked by normal mastication, brisk swallow response, excellent pharyngeal  clearance of all POs, and only transient, high penetration of thin liquids.  Despite successive, large liquid boluses consumed simultaneously with solids, pt protected his airway reliably.  Recommend initiating  a regular diet, thin liquids.  PMV must be in place for all PO consumption.     CHL IP TREATMENT RECOMMENDATION 07/20/2015 Treatment Recommendations Therapy as outlined in treatment plan below   CHL IP DIET RECOMMENDATION 07/20/2015 SLP Diet Recommendations Age appropriate regular solids;Thin Liquid Administration via (None) Medication Administration Whole meds with puree Compensations (None) Postural Changes and/or Swallow Maneuvers (None)   CHL IP OTHER RECOMMENDATIONS 07/20/2015 Recommended Consults (None) Oral Care Recommendations Oral care BID Other Recommendations (None)   CHL IP FOLLOW UP RECOMMENDATIONS 05/27/2015 Follow up Recommendations 24 hour supervision/assistance   CHL IP FREQUENCY AND DURATION 07/20/2015 Speech Therapy Frequency (ACUTE ONLY) min 2x/week Treatment Duration 2 weeks   SLP Swallow Goals No flowsheet data found. No flowsheet data found.   CHL IP REASON FOR REFERRAL 07/20/2015 Reason for Referral Objectively evaluate swallowing function   CHL IP ORAL PHASE 07/20/2015 Lips (  None) Tongue (None) Mucous membranes (None) Nutritional status (None) Other (None) Oxygen therapy (None) Oral Phase WFL Oral - Pudding Teaspoon (None) Oral - Pudding Cup (None) Oral - Honey Teaspoon (None) Oral - Honey Cup (None) Oral - Honey Syringe (None) Oral - Nectar Teaspoon (None) Oral - Nectar Cup (None) Oral - Nectar Straw (None) Oral - Nectar Syringe (None) Oral - Ice Chips (None) Oral - Thin Teaspoon (None) Oral - Thin Cup (None) Oral - Thin Straw (None) Oral - Thin Syringe (None) Oral - Puree (None) Oral - Mechanical Soft (None) Oral - Regular (None) Oral - Multi-consistency (None) Oral - Pill (None) Oral Phase - Comment (None)   CHL IP PHARYNGEAL PHASE 07/20/2015 Pharyngeal Phase WFL Pharyngeal -  Pudding Teaspoon (None) Penetration/Aspiration details (pudding teaspoon) (None) Pharyngeal - Pudding Cup (None) Penetration/Aspiration details (pudding cup) (None) Pharyngeal - Honey Teaspoon (None) Penetration/Aspiration details (honey teaspoon) (None) Pharyngeal - Honey Cup (None) Penetration/Aspiration details (honey cup) (None) Pharyngeal - Honey Syringe (None) Penetration/Aspiration details (honey syringe) (None) Pharyngeal - Nectar Teaspoon (None) Penetration/Aspiration details (nectar teaspoon) (None) Pharyngeal - Nectar Cup (None) Penetration/Aspiration details (nectar cup) (None) Pharyngeal - Nectar Straw (None) Penetration/Aspiration details (nectar straw) (None) Pharyngeal - Nectar Syringe (None) Penetration/Aspiration details (nectar syringe) (None) Pharyngeal - Ice Chips (None) Penetration/Aspiration details (ice chips) (None) Pharyngeal - Thin Teaspoon (None) Penetration/Aspiration details (thin teaspoon) (None) Pharyngeal - Thin Cup (None) Penetration/Aspiration details (thin cup) (None) Pharyngeal - Thin Straw (None) Penetration/Aspiration details (thin straw) (None) Pharyngeal - Thin Syringe (None) Penetration/Aspiration details (thin syringe') (None) Pharyngeal - Puree (None) Penetration/Aspiration details (puree) (None) Pharyngeal - Mechanical Soft (None) Penetration/Aspiration details (mechanical soft) (None) Pharyngeal - Regular (None) Penetration/Aspiration details (regular) (None) Pharyngeal - Multi-consistency (None) Penetration/Aspiration details (multi-consistency) (None) Pharyngeal - Pill (None) Penetration/Aspiration details (pill) (None) Pharyngeal Comment (None)   CHL IP CERVICAL ESOPHAGEAL PHASE 07/15/2015 Cervical Esophageal Phase WFL Pudding Teaspoon (None) Pudding Cup (None) Honey Teaspoon (None) Honey Cup (None) Honey Straw (None) Nectar Teaspoon (None) Nectar Cup (None) Nectar Straw (None) Nectar Sippy Cup (None) Thin Teaspoon (None) Thin Cup (None) Thin Straw (None) Thin Sippy  Cup (None) Cervical Esophageal Comment (None) No flowsheet data found.   Juan Quam Laurice 07/20/2015, 2:37 PM   Dg Swallowing Func-speech Pathology  07/15/2015  Objective Swallowing Evaluation:   Modified Barium Swallow Patient Details Name: Jason [name] MRN: [id number] Date of Birth: 1986-09-10 Today's Date: 07/15/2015 Time: SLP Start Time (ACUTE ONLY): 1347-SLP Stop Time (ACUTE ONLY): 1405 SLP Time Calculation (min) (ACUTE ONLY): 18 min Past Medical History: Past Medical History Diagnosis Date . GERD (gastroesophageal reflux disease)  . Asthma  . Hx MRSA infection    on face . Gastroparesis  . Diabetic neuropathy ([place])  . Seizures ([place])  . Family history of anesthesia complication    Pt mother can't have epidural procedures . Dysrhythmia  . Pneumonia  . Arthritis  . Fibromyalgia  . Stroke ([place]) 01/29/2014   spinal stroke ; "quadriplegic since" . Type I diabetes mellitus ([place])    sees Dr. Loanne Drilling  . Syncope 02/16/2015 Past Surgical History: Past Surgical History Procedure Laterality Date . Tonsillectomy   . Multiple extractions with alveoloplasty N/A 08/03/2014   Procedure: MULTIPLE EXTRACTIONS;  Surgeon: Gae Bon, DDS;  Location: [facility];  Service: Oral Surgery;  Laterality: N/A; . Tee without cardioversion N/A 08/17/2014   Procedure: TRANSESOPHAGEAL ECHOCARDIOGRAM (TEE);  Surgeon: Dorothy Spark, MD;  [facility] ENDOSCOPY;  Service: Cardiovascular;  Laterality: N/A; . Debridment of decubitus ulcer N/A 10/04/2014  Procedure: DEBRIDMENT OF DECUBITUS ULCER;  Surgeon: Georganna Skeans, MD;  Location: [facility];  Service: General;  Laterality: N/A; . Laparoscopic diverted colostomy N/A 10/12/2014   Procedure: LAPAROSCOPIC DIVERTING COLOSTOMY;  Surgeon: Donnie Mesa, MD;  Location: [place] [facility];  Service: General;  Laterality: N/A; . Insertion of suprapubic catheter N/A 10/12/2014   Procedure: INSERTION OF SUPRAPUBIC CATHETER;  Surgeon: Reece Packer, MD;  Location: [place];  Service: Urology;  Laterality: N/A;  . Incision and drainage of wound N/A 11/12/2014   Procedure: IRRIGATION AND DEBRIDEMENT OF WOUNDS WITH BONE BIOPSY AND SURGICAL PREP ;  Surgeon: Theodoro Kos, DO;  Location: [facility];  Service: Plastics;  Laterality: N/A; . Application of a-cell of back N/A 11/12/2014   Procedure: APPLICATION A CELL AND VAC ;  Surgeon: Theodoro Kos, DO;  Location: [facility];  Service: Plastics;  Laterality: N/A; . Incision and drainage of wound N/A 11/18/2014   Procedure: IRRIGATION AND DEBRIDEMENT OF SACRAL ULCER ONLY WITH PLACEMENT OF A CELL AND VAC/ DRESSING CHANGE TO UPPER BACK AREA.;  Surgeon: Theodoro Kos, DO;  Location: [facility] [place];  Service: Plastics;  Laterality: N/A; . Incision and drainage of wound N/A 11/25/2014   Procedure: IRRIGATION AND DEBRIDEMENT OF SACRAL ULCER AND BACK BURN WITH PLACEMENT OF A-CELL;  Surgeon: Theodoro Kos, DO;  Location: [facility];  Service: Plastics;  Laterality: N/A; . Minor application of wound vac N/A 11/25/2014   Procedure:  WOUND VAC CHANGE;  Surgeon: Theodoro Kos, DO;  Location: [facility];  Service: Plastics;  Laterality: N/A; . Incision and drainage of wound Right 12/08/2014   Procedure: IRRIGATION AND DEBRIDEMENT SACRAL WOUND AND RIGHT ISCHIAL WOUND ;  Surgeon: Theodoro Kos, DO;  Location: [facility];  Service: Plastics;  Laterality: Right; . Application of a-cell of back N/A 12/08/2014   Procedure: APPLICATION OF A-CELL AND WOUND VAC ;  Surgeon: Theodoro Kos, DO;  Location: [facility];  Service: Plastics;  Laterality: N/A; . Tee without cardioversion N/A 05/05/2015   Procedure: TRANSESOPHAGEAL ECHOCARDIOGRAM (TEE);  Surgeon: Lelon Perla, MD;  Location: [place] [facility];  Service: Cardiovascular;  Laterality: N/A; . Incision and drainage of wound Bilateral 05/05/2015   Procedure: IRRIGATION AND DEBRIDEMENT SACRAL ULCER;  Surgeon: Theodoro Kos, DO;  Location: [facility];  Service: Plastics;  Laterality: Bilateral; . Hematoma evacuation N/A 05/05/2015   Procedure: EVACUATION HEMATOMA bedside procedure;  Surgeon: Theodoro Kos, DO;   Location: [facility];  Service: Plastics;  Laterality: N/A; HPI: Other Pertinent Information: 29 y.o. male with history of spinal cord injury status post paraplegia who was recently admitted for MRSA and microaerophilic strep bacteremia with respiratory failure requiring trach; nocturnal vent. Admitted due to AMS. Found to have UTI, hypothermia, hypotension, toxic metabolic encephalopathy. In August 2016 pt seen for in-line Passy-Muir Speaking valve, trach'd and continued with PMSV. MBS 05/2015 recommended NPO, received PEG. Today repeat MBS for possible initiation of food/liquid. He had Speaking valve from prior admission and currently in place.  No Data Recorded Assessment / Plan / Recommendation CHL IP CLINICAL IMPRESSIONS 07/15/2015 Therapy Diagnosis Mild oral phase dysphagia;Mild pharyngeal phase dysphagia;Moderate pharyngeal phase dysphagia Clinical Impression MBS performed with PMSV in place. Mild prolonged mastication and oral transit with solid texture. Pharyngeal phase characterized by motor impairments with silent laryngeal penetration with nectar with chin tuck posture increasing amount and depth of penetration. Mild-mod vallecular and pyriform sinus residue reduced with verbal cues for second swallow. Honey thick liquids recommended with second swallow (aspiration risk to high during the swallow with nectar), Dys  2 diet, full assist for feeding and precautions, swallow twice following bites/sips, wear Passy-Muir speaking valve with all meals/meds and upright posture, continued ST.     CHL IP TREATMENT RECOMMENDATION 07/15/2015 Treatment Recommendations Therapy as outlined in treatment plan below   CHL IP DIET RECOMMENDATION 07/15/2015 SLP Diet Recommendations Dysphagia 2 (Fine chop) Liquid Administration via (None) Medication Administration Whole meds with puree Compensations Slow rate;Small sips/bites;Multiple dry swallows after each bite/sip Postural Changes and/or Swallow Maneuvers (None)   CHL IP OTHER  RECOMMENDATIONS 07/15/2015 Recommended Consults (None) Oral Care Recommendations Oral care BID Other Recommendations (None)   CHL IP FOLLOW UP RECOMMENDATIONS 05/27/2015 Follow up Recommendations 24 hour supervision/assistance   CHL IP FREQUENCY AND DURATION 07/15/2015 Speech Therapy Frequency (ACUTE ONLY) min 2x/week Treatment Duration 2 weeks   Pertinent Vitals/Pain none  SLP Swallow Goals No flowsheet data found. No flowsheet data found.   CHL IP REASON FOR REFERRAL 07/15/2015 Reason for Referral Objectively evaluate swallowing function       No flowsheet data found.   [name] 07/15/2015, 2:52 PM Orbie Pyo Colvin Caroli.Ed CCC-SLP Pager [phone]    CBC  Recent Labs Lab 08/02/15 0015 08/04/15 0419 08/05/15 2200  WBC 16.5* 15.6* 21.8*  HGB 8.3* 8.0* 8.6*  HCT 27.2* 26.1* 27.1*  PLT 914* 844* 894*  MCV 86.1 85.6 85.0  MCH 26.3 26.2 27.0  MCHC 30.5 30.7 31.7  RDW 15.4 15.3 15.1    Chemistries   Recent Labs Lab 08/01/15 0541 08/02/15 0015 08/04/15 0419 08/05/15 0920 08/05/15 2200  NA 134* 131* 129*  --  130*  K 4.8 4.8 4.5  --  4.7  CL 103 100* 95*  --  96*  CO2 23 23 26   --  25  GLUCOSE 159* 217* 123* 508* 165*  BUN 16 11 12   --  10  CREATININE 0.76 0.66 0.58*  --  0.61  CALCIUM 8.8* 8.6* 8.5*  --  9.0   ------------------------------------------------------------------------------------------------------------------ estimated creatinine clearance is 102.2 mL/min (by C-G formula based on Cr of 0.61). ------------------------------------------------------------------------------------------------------------------ No results for input(s): HGBA1C in the last 72 hours. ------------------------------------------------------------------------------------------------------------------ No results for input(s): CHOL, HDL, LDLCALC, TRIG, CHOLHDL, LDLDIRECT in the last 72  hours. ------------------------------------------------------------------------------------------------------------------ No results for input(s): TSH, T4TOTAL, T3FREE, THYROIDAB in the last 72 hours.  Invalid input(s): FREET3 ------------------------------------------------------------------------------------------------------------------ No results for input(s): VITAMINB12, FOLATE, FERRITIN, TIBC, IRON, RETICCTPCT in the last 72 hours.  Coagulation profile No results for input(s): INR, PROTIME in the last 168 hours.  No results for input(s): DDIMER in the last 72 hours.  Cardiac Enzymes No results for input(s): CKMB, TROPONINI, MYOGLOBIN in the last 168 hours.  Invalid input(s): CK ------------------------------------------------------------------------------------------------------------------ Invalid input(s): [place]  08/05/15 1134 08/05/15 1703 08/05/15 1951 08/06/15 0234 08/06/15 0738 08/06/15 1137  GLUCAP 389* 53* 64* 65 93 128*     Brittani Purdum M.D. [facility] 08/06/2015, 12:24 PM  Pager: [phone] Between 7am to 7pm - call Pager - [phone]  After 7pm go to [email or web address] - password [email or web address]  Call night coverage person covering after 7pm

## 2015-08-07 LAB — BASIC METABOLIC PANEL
ANION GAP: 12 (ref 5–15)
BUN: 10 mg/dL (ref 6–20)
CALCIUM: 9.2 mg/dL (ref 8.9–10.3)
CO2: 21 mmol/L — ABNORMAL LOW (ref 22–32)
Chloride: 101 mmol/L (ref 101–111)
Creatinine, Ser: 0.58 mg/dL — ABNORMAL LOW (ref 0.61–1.24)
GFR calc Af Amer: >60 mL/min (ref >60)
GLUCOSE: 64 mg/dL — AB (ref 65–99)
POTASSIUM: 6.2 mmol/L — AB (ref 3.5–5.1)
SODIUM: 134 mmol/L — AB (ref 135–145)

## 2015-08-07 LAB — GLUCOSE, CAPILLARY
GLUCOSE-CAPILLARY: 60 mg/dL — AB (ref 65–99)
GLUCOSE-CAPILLARY: 112 mg/dL — AB (ref 65–99)
GLUCOSE-CAPILLARY: 185 mg/dL — AB (ref 65–99)
GLUCOSE-CAPILLARY: 177 mg/dL — AB (ref 65–99)
Glucose-Capillary: 126 mg/dL — ABNORMAL HIGH (ref 65–99)
Glucose-Capillary: 162 mg/dL — ABNORMAL HIGH (ref 65–99)

## 2015-08-07 LAB — CBC
HCT: 26.6 % — ABNORMAL LOW (ref 39.0–52.0)
HEMOGLOBIN: 8.5 g/dL — AB (ref 13.0–17.0)
MCH: 27.2 pg (ref 26.0–34.0)
MCHC: 32 g/dL (ref 30.0–36.0)
MCV: 85 fL (ref 78.0–100.0)
PLATELETS: 888 10*3/uL — AB (ref 150–400)
RBC: 3.13 MIL/uL — ABNORMAL LOW (ref 4.22–5.81)
RDW: 15.2 % (ref 11.5–15.5)
WBC: 20.5 10*3/uL — ABNORMAL HIGH (ref 4.0–10.5)

## 2015-08-07 LAB — MAGNESIUM: MAGNESIUM: 1.5 mg/dL — AB (ref 1.7–2.4)

## 2015-08-07 LAB — POTASSIUM: Potassium: 4.5 mmol/L (ref 3.5–5.1)

## 2015-08-07 LAB — VANCOMYCIN, TROUGH: Vancomycin Tr: 29 ug/mL (ref 10.0–20.0)

## 2015-08-07 MED ORDER — SODIUM CHLORIDE 0.9 % IJ SOLN
10.0000 mL | INTRAMUSCULAR | Status: DC | PRN
  Administered 2015-08-08 – 2015-08-16 (×6): 10 mL
  Filled 2015-08-07 (×6): qty 40

## 2015-08-07 MED ORDER — SODIUM CHLORIDE 0.9 % IJ SOLN
10.0000 mL | Freq: Two times a day (BID) | INTRAMUSCULAR | Status: DC
  Administered 2015-08-07 – 2015-08-15 (×8): 10 mL

## 2015-08-07 MED ORDER — MAGNESIUM SULFATE 2 GM/50ML IV SOLN
2.0000 g | Freq: Once | INTRAVENOUS | Status: AC
  Administered 2015-08-07: 2 g via INTRAVENOUS
  Filled 2015-08-07: qty 50

## 2015-08-07 MED ORDER — VANCOMYCIN HCL IN DEXTROSE 750-5 MG/150ML-% IV SOLN: 750.0000 mg | INTRAVENOUS | Status: DC

## 2015-08-07 NOTE — Progress Notes (Signed)
Received a call from the lab that pt has a critical vanc trough of 29.2. Primary nurse will be informed of this lab result

## 2015-08-07 NOTE — Progress Notes (Signed)
[facility]                                                                              Patient Demographics  Jason [name], is a 29 y.o. male, DOB - July 30, 1986, WUJ:[id number]  Admit date - 07/10/2015   Admitting Physician [name], MD  Outpatient Primary MD for the patient is [name], MD  LOS - 28   Chief Complaint  Patient presents with  . Altered Mental Status       Brief HPI   29 y.o. 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 Patient was recently tapered off his IV antibiotics and was placed on doxycycline and Cipro 3 days prior to this admit for possible UTI.   In the ER patient UA shows features concerning for UTI - patient has chronic suprapubic catheter. Patient was also found to be hypothermic and hypotensive. Patient has had previous history of multiple debridement for chronic sacral decubitus which at this time looks to be healing. Patient also has history of chronic osteomyelitis. S/p biopsy by plastics  Assessment & Plan   Principal problem Toxic metabolic encephalopathy Resolved - patient is presently alert and oriented  Acute problem Sepsis due to MDR Pseudomonas Aeruginosa UTI ? Vs decubitus/osteomyelitis - suprapubic catheter changed by urology - ID and plastics following: CT Scan showed: Sacral decubitus ulcer with prior distal sacrococcygeal destruction or resection and foci of gas within the LEFT gluteus maximus question infection. - Dr. Migdalia Dk.s/p biopsy on 10/27- cultures negative so far.  -  Still spiking fevers, but trending douwn. Infectious disease ordered urine culture and resumed fosfomycin-- growing yeast and pseudomonas, also on vanc, vanc trough  level elevated at 29.2, pharmacy held vanc dosing.   poor iv access, picc line ordered,   Hyperkalemia: erroneous lab value due to hemolyzed blood sample, repeat k wnl.  Acute renal failure Due to sepsis -resolved  Hyponatremia  Mild, improving  Hypomagnesemia: replace mag  Diabetes mellitus type 1 with hyperglycemia - Decrease lantus for now- refused diabetic diet - Patient and family upset about holding tube feeds although patient has been eating normally.   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 Plastics/ID consulted  Chronic anemia H&H currently stable   Chronic respiratory failure with trach and vent pulmonary critical care supposed to be managing vent - it appears the patient took himself off the vent 10/7 and has not been back on since - currently appears stable on 28% trach collar  - As per PCCM ELink note 10/3, it appears that patient is followed in OP trach clinic> will defer further trach decisions to OP follow up in that clinic. - Dr. Eliseo Squires discussed with CCM M.D. on call 10/16: Agrees with outpatient follow-up with trach clinic and indicates that he may not be decannulated.   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 IVC filter placed 8/6  History of spinal cord injury with paraplegia  Severe malnutrition in context of chronic illness, Underweight On night time TF  DVT Prophylaxis: Lovenox   Code Status: Full CODE STATUS  Family Communication: Discussed in detail with the patient, all imaging results, lab results explained to the patient, parents at the bedside   Disposition Plan: Medicaid has no LTAC coverage  Time Spent in minutes  25 minutes  Procedures  None  Consults    PCCM   Infectious disease  Plastics- [place]   Medications  Scheduled Meds: . baclofen  10 mg Oral 2 times per day  . baclofen  20 mg Oral QHS  . collagenase   Topical Daily  . enoxaparin (LOVENOX) injection  40 mg Subcutaneous Daily  . fentaNYL  50 mcg Transdermal Q72H  .  Influenza vac split quadrivalent PF  0.5 mL Intramuscular Tomorrow-1000  . insulin aspart  0-15 Units Subcutaneous TID WC  . insulin aspart  0-5 Units Subcutaneous QHS  . insulin aspart  3 Units Subcutaneous TID WC  . insulin glargine  15 Units Subcutaneous Daily  . midodrine  30 mg Oral TID WC  . multivitamin with minerals  1 tablet Oral Daily  . pantoprazole sodium  40 mg Oral Daily  . pregabalin  100 mg Oral 3 times per day  . pregabalin  200 mg Oral QHS   Continuous Infusions: . sodium chloride 10 mL/hr at 08/05/15 1038  . feeding supplement (VITAL 1.0 CAL)    . lactated ringers 10 mL/hr at 08/04/15 1721   PRN Meds:.acetaminophen (TYLENOL) oral liquid 160 mg/5 mL, albuterol, diphenhydrAMINE-zinc acetate, LORazepam, mirtazapine, ondansetron **OR** ondansetron (ZOFRAN) IV, oxyCODONE, RESOURCE THICKENUP CLEAR   Antibiotics   Anti-infectives    Start     Dose/Rate Route Frequency Ordered Stop   08/05/15 1100  vancomycin (VANCOCIN) IVPB 750 mg/150 ml premix  Status:  Discontinued     750 mg 150 mL/hr over 60 Minutes Intravenous Every 12 hours 08/05/15 1015 08/07/15 1229   08/01/15 1000  fluconazole (DIFLUCAN) tablet 200 mg  Status:  Discontinued     200 mg Oral Daily 08/01/15 0839 08/01/15 1342   07/31/15 1500  vancomycin (VANCOCIN) IVPB 750 mg/150 ml premix  Status:  Discontinued     750 mg 150 mL/hr over 60 Minutes Intravenous Every 48 hours 07/31/15 1322 08/01/15 1342   07/27/15 1800  vancomycin (VANCOCIN) 500 mg in sodium chloride 0.9 % 100 mL IVPB     500 mg 100 mL/hr over 60 Minutes Intravenous Every 12 hours 07/27/15 0048 07/29/15 1846   07/23/15 2300  vancomycin (VANCOCIN) IVPB 750 mg/150 ml premix  Status:  Discontinued     750 mg 150 mL/hr over 60 Minutes Intravenous Every 24 hours 07/23/15 2215 07/27/15 0048   07/18/15 0000  vancomycin (VANCOCIN) IVPB 750 mg/150 ml premix     750 mg 150 mL/hr over 60 Minutes Intravenous Every 24 hours 07/17/15 0508 07/19/15 0123    07/17/15 0100  vancomycin (VANCOCIN) 500 mg in sodium chloride 0.9 % 100 mL IVPB  Status:  Discontinued     500 mg 100 mL/hr over 60 Minutes Intravenous Every 24 hours 07/16/15 1016 07/17/15 0507   07/13/15 2000  imipenem-cilastatin (PRIMAXIN) 250 mg in sodium chloride 0.9 % 100 mL IVPB     250 mg 200 mL/hr over 30 Minutes Intravenous 3 times per day 07/13/15 1046 07/19/15 2256   07/11/15 2200  imipenem-cilastatin (PRIMAXIN) 250 mg in sodium chloride 0.9 % 100 mL IVPB  Status:  Discontinued  250 mg 200 mL/hr over 30 Minutes Intravenous Every 12 hours 07/11/15 1020 07/13/15 1046   07/10/15 2230  imipenem-cilastatin (PRIMAXIN) 250 mg in sodium chloride 0.9 % 100 mL IVPB  Status:  Discontinued     250 mg 200 mL/hr over 30 Minutes Intravenous 3 times per day 07/10/15 2226 07/11/15 1020   07/10/15 2230  vancomycin (VANCOCIN) 500 mg in sodium chloride 0.9 % 100 mL IVPB  Status:  Discontinued     500 mg 100 mL/hr over 60 Minutes Intravenous Every 24 hours 07/10/15 2226 07/16/15 1016        Subjective:   [employer] was seen and examined today. tmax 102.6 7pm yesterday evening, fever subsided, reported feeling better this am, aaox3, poor iv access, patient agreed to picc line.   Patient denies dizziness, chest pain, shortness of breath, abdominal pain, N/V/D/C, chronic suprapubic catheter, clear urine, colostomy bag in place, denies diarrhea   Objective:   Blood pressure 92/66, pulse 82, temperature 98.8 F (37.1 C), temperature source Oral, resp. rate 16, height 5\' 7"  (1.702 m), weight 118 lb 14.4 oz (53.933 kg), SpO2 98 %.  Wt Readings from Last 3 Encounters:  08/07/15 118 lb 14.4 oz (53.933 kg)  07/05/15 99 lb (44.906 kg)  06/01/15 111 lb (50.349 kg)     Intake/Output Summary (Last 24 hours) at 08/07/15 1236 Last data filed at 08/07/15 0643  Gross per 24 hour  Intake   1470 ml  Output   3300 ml  Net  -1830 ml    Exam  General: Alert and oriented x 3, NAD  HEENT:   PERRLA, EOMI,   Neck: Trach+  CVS: S1 S2 auscultated, no rubs, murmurs or gallops. Regular rate and rhythm.  Respiratory: Decreased breath sounds at the bases  Abdomen: Soft, nontender, nondistended, + bowel sounds  Ext: no cyanosis clubbing or edema  Neuro: quadriparesis  Skin: No rashes  Psych: Normal affect and demeanor, alert and oriented x3    Data Review   Micro Results Recent Results (from the past 240 hour(s))  Culture, Urine     Status: None (Preliminary result)   Collection Time: 07/29/15  6:06 PM  Result Value Ref Range Status   Specimen Description URINE, SUPRAPUBIC  Final   Special Requests Normal  Final   Culture   Final    >=100,000 COLONIES/mL PSEUDOMONAS AERUGINOSA AMIKACIN MIC=8 SENSITIVE >=100,000 COLONIES/mL YEAST    Report Status PENDING  Incomplete   Organism ID, Bacteria PSEUDOMONAS AERUGINOSA  Final      Susceptibility   Pseudomonas aeruginosa - MIC*    CEFTAZIDIME 16 INTERMEDIATE Intermediate     CIPROFLOXACIN >=4 RESISTANT Resistant     GENTAMICIN >=16 RESISTANT Resistant     IMIPENEM >=16 RESISTANT Resistant     CEFEPIME 16 INTERMEDIATE Intermediate     * >=100,000 COLONIES/mL PSEUDOMONAS AERUGINOSA  Anaerobic culture     Status: None (Preliminary result)   Collection Time: 08/04/15  7:08 PM  Result Value Ref Range Status   Specimen Description TISSUE LEFT SACRAL  Final   Special Requests NONE  Final   Gram Stain   Final    NO WBC SEEN NO ORGANISMS SEEN Performed at [facility]    Culture   Final    NO ANAEROBES ISOLATED; CULTURE IN PROGRESS FOR 5 DAYS Performed at [facility]    Report Status PENDING  Incomplete  Fungus Culture with Smear     Status: None (Preliminary result)   Collection Time:  08/04/15  7:11 PM  Result Value Ref Range Status   Specimen Description TISSUE LEFT SACRAL  Final   Special Requests NONE  Final   Fungal Smear   Final    NO YEAST OR FUNGAL ELEMENTS SEEN Performed at [facility]    Culture   Final    CULTURE IN PROGRESS FOR FOUR WEEKS Performed at [facility]    Report Status PENDING  Incomplete  AFB culture with smear     Status: None (Preliminary result)   Collection Time: 08/04/15  7:13 PM  Result Value Ref Range Status   Specimen Description TISSUE LEFT SACRAL  Final   Special Requests NONE  Final   Acid Fast Smear   Final    NO ACID FAST BACILLI SEEN Performed at [facility]    Culture   Final    CULTURE WILL BE EXAMINED FOR 6 WEEKS BEFORE ISSUING A FINAL REPORT Performed at [facility]    Report Status PENDING  Incomplete  Tissue culture     Status: None   Collection Time: 08/04/15  7:18 PM  Result Value Ref Range Status   Specimen Description TISSUE LEFT SACRAL  Final   Special Requests NONE  Final   Gram Stain   Final    NO WBC SEEN NO ORGANISMS SEEN Performed at [facility]    Culture   Final    MULTIPLE ORGANISMS PRESENT, NONE PREDOMINANT Note: NO GROUP A STREP (S.PYOGENES) ISOLATED NO STAPHYLOCOCCUS AUREUS ISOLATED Performed at [facility]    Report Status 08/07/2015 FINAL  Final    Radiology Reports Ct Abdomen Pelvis Wo Contrast  07/11/2015  CLINICAL DATA:  Sepsis. EXAM: CT ABDOMEN AND PELVIS WITHOUT CONTRAST TECHNIQUE: Multidetector CT imaging of the abdomen and pelvis was performed following the standard protocol without IV contrast. COMPARISON:  CT scan of pelvis of May 02, 2015; CT scan of abdomen pelvis December 11, 2014. FINDINGS: Bilateral posterior basilar opacity is are noted, right greater than left, concerning for possible pneumonia or subsegmental atelectasis. Gastrostomy tube is in grossly good position. IVC filter is noted in infrarenal position. No gallstones are noted. No focal abnormality is noted in the liver, spleen or pancreas on these unenhanced images. Adrenal glands appear normal. Mild left hydronephrosis is noted secondary to 3 mm calculus in the proximal left  ureter. Multiple small nonobstructive calculi are noted in right kidney. There is no evidence of bowel obstruction. Ostomy is noted in left lower quadrant. Foley catheter is noted within the urinary bladder. Wall thickening of the urinary bladder is noted which may simply represent lack of distension, but cystitis cannot be excluded. Mild anasarca is noted. Minimal free fluid is noted in the dependent portion of the pelvis. Large sacral decubitus ulcer is noted which is significantly increased in size. There has been surgical resection of the distal sacrum and coccyx. Another large sacral ulceration is seen extending to the right ischial tuberosity which is unchanged compared to prior exam. No lytic destruction is seen at this time to suggest acute osteomyelitis. IMPRESSION: Nonobstructive right nephrolithiasis. Mild left hydronephrosis is noted secondary to 3 mm calculus seen in the proximal left ureter. Bilateral posterior basilar lung opacities are noted, right greater than left, concerning for pneumonia or possibly atelectasis. Mild anasarca. Foley catheter is present within the urinary bladder. Urinary bladder wall thickening is noted which may be due to lack of distension, but cystitis cannot be excluded. Stable decubitus ulcer seen in  right-sided pelvis extending to ischial tuberosity. Large sacral decubitus ulcer is noted which is increased in size compared to prior exam. However, no lytic destruction is seen to suggest active osteomyelitis at this time. Electronically Signed   By: Marijo Conception, M.D.   On: 07/11/2015 14:16   Ct Head Wo Contrast  07/11/2015  CLINICAL DATA:  Acute encephalopathy, probable sepsis from UTI, paraplegic secondary to spinal cord injury, asthma, type I diabetes mellitus, stroke EXAM: CT HEAD WITHOUT CONTRAST TECHNIQUE: Contiguous axial images were obtained from the base of the skull through the vertex without intravenous contrast. COMPARISON:  01/06/2015 FINDINGS: Normal  ventricular morphology. No midline shift or mass effect. Normal appearance of brain parenchyma. No intracranial hemorrhage, mass lesion, or acute infarction. Visualized paranasal sinuses and mastoid air cells clear. Bones unremarkable. IMPRESSION: No acute intracranial abnormalities. Electronically Signed   By: Lavonia Dana M.D.   On: 07/11/2015 14:01   Ct Abdomen Pelvis W Contrast  07/25/2015  CLINICAL DATA:  GERD fever of unknown origin, recent MRSA infection, strep bacteremia, respiratory failure, admitted with hallucinations and confusion, question UTI ; history spinal cord injury and paraplegia, type I diabetes mellitus EXAM: CT ABDOMEN AND PELVIS WITH CONTRAST TECHNIQUE: Multidetector CT imaging of the abdomen and pelvis was performed using the standard protocol following bolus administration of intravenous contrast. Sagittal and coronal MPR images reconstructed from axial data set. CONTRAST:  163mL OMNIPAQUE IOHEXOL 300 MG/ML SOLN IV. Dilute oral contrast. COMPARISON:  07/11/2015 FINDINGS: Infiltrate RIGHT lower lobe. Mild atelectasis LEFT lower lobe. Peribronchial thickening in both lower lobes with RIGHT lower lobe bronchiectasis. Contracted gallbladder. Scarring at medial aspect of inferior pole RIGHT kidney unchanged. Liver, spleen, pancreas, kidneys, and adrenal glands otherwise unremarkable. IVC filter and gastrostomy tube. Normal appendix. Question bowel wall thickening involving the cecum and ascending colon versus artifact from underdistention. LEFT lower quadrant descending colostomy. Stomach and remaining bowel loops unremarkable. Bladder wall thickening, question related to chronic suprapubic catheterization in urinary tract infection versus chronic outlet obstruction. LEFT ureter distended though this could be related to bladder distention. Vascular structures grossly patent. Large sacral decubitus ulcer with prior distal sacrococcygeal destruction or resection. Foci of soft tissue gas within  LEFT gluteus maximus question penetration by decubitus ulcer and infection. Diffuse soft tissue edema. Additional foci of nonspecific soft tissue gas RIGHT lateral abdomen. No mass, adenopathy, free air or free fluid. Bones demineralized. IMPRESSION: Questionable wall thickening of the cecum and ascending colon versus artifact from underdistention, unable to exclude colitis. Diffuse bladder wall thickening which may be related to chronic catheterization/infection or outlet obstruction. Diffuse soft tissue edema which can be seen with anasarca and hypoproteinemia. Peribronchial thickening in both lower lobes with RIGHT lower lobe bronchiectasis and RIGHT lower lobe infiltrate. Sacral decubitus ulcer with prior distal sacrococcygeal destruction or resection and foci of gas within the LEFT gluteus maximus question infection. Additional nonspecific foci of soft tissue gas in the RIGHT lateral abdominal wall soft tissue planes. Electronically Signed   By: Lavonia Dana M.D.   On: 07/25/2015 17:46   Dg Chest Port 1 View  07/27/2015  CLINICAL DATA:  Tracheostomy and fever EXAM: PORTABLE CHEST 1 VIEW COMPARISON:  07/16/2015 FINDINGS: Tracheostomy tube in unchanged position over the tracheal air column. Heart size and vascular pattern are normal. There are mild bilateral lower lobe infiltrates. When compared to the prior study there both significantly improved. No pleural effusion. Stable mild expansile deformity distal right clavicle possibly due to prior trauma. IMPRESSION: Persistent but  improved bilateral lower lobe infiltrates. Electronically Signed   By: Skipper Cliche M.D.   On: 07/27/2015 08:09   Dg Chest Port 1 View  07/16/2015  CLINICAL DATA:  Pneumonia. EXAM: PORTABLE CHEST 1 VIEW COMPARISON:  July 13, 2015. FINDINGS: The heart size and mediastinal contours are within normal limits. Tracheostomy is unchanged in position. No pneumothorax is noted. Increased bibasilar opacities are noted concerning for edema  or pneumonia. Minimal right pleural effusion is noted. The visualized skeletal structures are unremarkable. IMPRESSION: Increased bibasilar opacities are noted concerning for worsening edema or pneumonia. Minimal right pleural effusion is noted. Electronically Signed   By: Marijo Conception, M.D.   On: 07/16/2015 08:29   Dg Chest Port 1 View  07/13/2015  CLINICAL DATA:  Follow-up of respiratory failure EXAM: PORTABLE CHEST 1 VIEW COMPARISON:  Portable chest x-ray of July 10, 2015 FINDINGS: The lungs are adequately inflated. The interstitial markings are slightly more conspicuous today especially in the infrahilar regions bilaterally greater on the left than on the right. There is no pleural effusion or pneumothorax. The heart and pulmonary vascularity are normal. The tracheostomy appliance tip projects just inferior to the inferior margins of the clavicular heads. IMPRESSION: Interval development of subsegmental atelectasis in the infrahilar regions bilaterally. There is no alveolar infiltrate nor evidence of pulmonary edema. Electronically Signed   By: David  Martinique M.D.   On: 07/13/2015 07:46   Dg Chest Port 1 View  07/10/2015  CLINICAL DATA:  Altered mental status. History of asthma, pneumonia, stroke, diabetes EXAM: PORTABLE CHEST 1 VIEW COMPARISON:  05/30/2015 FINDINGS: There is a tracheostomy tube with the tip 3.5 cm above the carina. There is no focal parenchymal opacity. There is no pleural effusion or pneumothorax. The heart and mediastinal contours are unremarkable. There is an old healed right clavicular fracture. IMPRESSION: No active disease. Electronically Signed   By: Kathreen Devoid   On: 07/10/2015 17:29   Dg Swallowing Func-speech Pathology  07/20/2015  Objective Swallowing Evaluation:   Patient Details Name: Jason [name] MRN: [id number] Date of Birth: May 12, 1986 Today's Date: 07/20/2015 Time: SLP Start Time (ACUTE ONLY): 1400-SLP Stop Time (ACUTE ONLY): 1420 SLP Time Calculation (min)  (ACUTE ONLY): 20 min Past Medical History: Past Medical History Diagnosis Date . GERD (gastroesophageal reflux disease)  . Asthma  . Hx MRSA infection    on face . Gastroparesis  . Diabetic neuropathy ([place])  . Seizures ([place])  . Family history of anesthesia complication    Pt mother can't have epidural procedures . Dysrhythmia  . Pneumonia  . Arthritis  . Fibromyalgia  . Stroke ([place]) 01/29/2014   spinal stroke ; "quadriplegic since" . Type I diabetes mellitus ([place])    sees Dr. Loanne Drilling  . Syncope 02/16/2015 Past Surgical History: Past Surgical History Procedure Laterality Date . Tonsillectomy   . Multiple extractions with alveoloplasty N/A 08/03/2014   Procedure: MULTIPLE EXTRACTIONS;  Surgeon: Gae Bon, DDS;  Location: [place];  Service: Oral Surgery;  Laterality: N/A; . Tee without cardioversion N/A 08/17/2014   Procedure: TRANSESOPHAGEAL ECHOCARDIOGRAM (TEE);  Surgeon: Dorothy Spark, MD;  Location: [facility] ENDOSCOPY;  Service: Cardiovascular;  Laterality: N/A; . Debridment of decubitus ulcer N/A 10/04/2014   Procedure: DEBRIDMENT OF DECUBITUS ULCER;  Surgeon: Georganna Skeans, MD;  Location: [place];  Service: General;  Laterality: N/A; . Laparoscopic diverted colostomy N/A 10/12/2014   Procedure: LAPAROSCOPIC DIVERTING COLOSTOMY;  Surgeon: Donnie Mesa, MD;  Location: [place];  Service: General;  Laterality: N/A; .  Insertion of suprapubic catheter N/A 10/12/2014   Procedure: INSERTION OF SUPRAPUBIC CATHETER;  Surgeon: Reece Packer, MD;  Location: [place];  Service: Urology;  Laterality: N/A; . Incision and drainage of wound N/A 11/12/2014   Procedure: IRRIGATION AND DEBRIDEMENT OF WOUNDS WITH BONE BIOPSY AND SURGICAL PREP ;  Surgeon: Theodoro Kos, DO;  Location: [place];  Service: Plastics;  Laterality: N/A; . Application of a-cell of back N/A 11/12/2014   Procedure: APPLICATION A CELL AND VAC ;  Surgeon: Theodoro Kos, DO;  Location: [place];  Service: Plastics;  Laterality: N/A; . Incision and drainage of wound N/A  11/18/2014   Procedure: IRRIGATION AND DEBRIDEMENT OF SACRAL ULCER ONLY WITH PLACEMENT OF A CELL AND VAC/ DRESSING CHANGE TO UPPER BACK AREA.;  Surgeon: Theodoro Kos, DO;  Location: [place];  Service: Plastics;  Laterality: N/A; . Incision and drainage of wound N/A 11/25/2014   Procedure: IRRIGATION AND DEBRIDEMENT OF SACRAL ULCER AND BACK BURN WITH PLACEMENT OF A-CELL;  Surgeon: Theodoro Kos, DO;  Location: [place];  Service: Plastics;  Laterality: N/A; . Minor application of wound vac N/A 11/25/2014   Procedure:  WOUND VAC CHANGE;  Surgeon: Theodoro Kos, DO;  Location: [place];  Service: Plastics;  Laterality: N/A; . Incision and drainage of wound Right 12/08/2014   Procedure: IRRIGATION AND DEBRIDEMENT SACRAL WOUND AND RIGHT ISCHIAL WOUND ;  Surgeon: Theodoro Kos, DO;  Location: [place];  Service: Plastics;  Laterality: Right; . Application of a-cell of back N/A 12/08/2014   Procedure: APPLICATION OF A-CELL AND WOUND VAC ;  Surgeon: Theodoro Kos, DO;  Location: [place];  Service: Plastics;  Laterality: N/A; . Tee without cardioversion N/A 05/05/2015   Procedure: TRANSESOPHAGEAL ECHOCARDIOGRAM (TEE);  Surgeon: Lelon Perla, MD;  Location: [place];  Service: Cardiovascular;  Laterality: N/A; . Incision and drainage of wound Bilateral 05/05/2015   Procedure: IRRIGATION AND DEBRIDEMENT SACRAL ULCER;  Surgeon: Theodoro Kos, DO;  Location: [place];  Service: Plastics;  Laterality: Bilateral; . Hematoma evacuation N/A 05/05/2015   Procedure: EVACUATION HEMATOMA bedside procedure;  Surgeon: Theodoro Kos, DO;  Location: [place];  Service: Plastics;  Laterality: N/A; HPI: Other Pertinent Information: 29 y.o. male with history of spinal cord injury status post paraplegia who was recently admitted for MRSA and microaerophilic strep bacteremia with respiratory failure requiring trach; nocturnal vent. Admitted due to AMS. Found to have UTI, hypothermia, hypotension, toxic metabolic encephalopathy. In August 2016 pt seen for in-line  Passy-Muir Speaking valve, trach'd and continued with PMSV. MBS 05/2015 recommended NPO, received PEG. MBS 10/7 with recs for dysphagia 2, honey thick secondary to silent, deep penetration nectars; mild oral phase deficits.  Pt has been tolerating current diet with valve in place for all POs.  No Data Recorded Assessment / Plan / Recommendation CHL IP CLINICAL IMPRESSIONS 07/20/2015 Therapy Diagnosis WFL Clinical Impression Repeat MBS completed with PMV in place.  Pt with much improved swallow function marked by normal mastication, brisk swallow response, excellent pharyngeal clearance of all POs, and only transient, high penetration of thin liquids.  Despite successive, large liquid boluses consumed simultaneously with solids, pt protected his airway reliably.  Recommend initiating  a regular diet, thin liquids.  PMV must be in place for all PO consumption.     CHL IP TREATMENT RECOMMENDATION 07/20/2015 Treatment Recommendations Therapy as outlined in treatment plan below   CHL IP DIET RECOMMENDATION 07/20/2015 SLP Diet Recommendations Age appropriate regular solids;Thin Liquid Administration via (None) Medication Administration Whole meds with  puree Compensations (None) Postural Changes and/or Swallow Maneuvers (None)   CHL IP OTHER RECOMMENDATIONS 07/20/2015 Recommended Consults (None) Oral Care Recommendations Oral care BID Other Recommendations (None)   CHL IP FOLLOW UP RECOMMENDATIONS 05/27/2015 Follow up Recommendations 24 hour supervision/assistance   CHL IP FREQUENCY AND DURATION 07/20/2015 Speech Therapy Frequency (ACUTE ONLY) min 2x/week Treatment Duration 2 weeks   SLP Swallow Goals No flowsheet data found. No flowsheet data found.   CHL IP REASON FOR REFERRAL 07/20/2015 Reason for Referral Objectively evaluate swallowing function   CHL IP ORAL PHASE 07/20/2015 Lips (None) Tongue (None) Mucous membranes (None) Nutritional status (None) Other (None) Oxygen therapy (None) Oral Phase WFL Oral - Pudding  Teaspoon (None) Oral - Pudding Cup (None) Oral - Honey Teaspoon (None) Oral - Honey Cup (None) Oral - Honey Syringe (None) Oral - Nectar Teaspoon (None) Oral - Nectar Cup (None) Oral - Nectar Straw (None) Oral - Nectar Syringe (None) Oral - Ice Chips (None) Oral - Thin Teaspoon (None) Oral - Thin Cup (None) Oral - Thin Straw (None) Oral - Thin Syringe (None) Oral - Puree (None) Oral - Mechanical Soft (None) Oral - Regular (None) Oral - Multi-consistency (None) Oral - Pill (None) Oral Phase - Comment (None)   CHL IP PHARYNGEAL PHASE 07/20/2015 Pharyngeal Phase WFL Pharyngeal - Pudding Teaspoon (None) Penetration/Aspiration details (pudding teaspoon) (None) Pharyngeal - Pudding Cup (None) Penetration/Aspiration details (pudding cup) (None) Pharyngeal - Honey Teaspoon (None) Penetration/Aspiration details (honey teaspoon) (None) Pharyngeal - Honey Cup (None) Penetration/Aspiration details (honey cup) (None) Pharyngeal - Honey Syringe (None) Penetration/Aspiration details (honey syringe) (None) Pharyngeal - Nectar Teaspoon (None) Penetration/Aspiration details (nectar teaspoon) (None) Pharyngeal - Nectar Cup (None) Penetration/Aspiration details (nectar cup) (None) Pharyngeal - Nectar Straw (None) Penetration/Aspiration details (nectar straw) (None) Pharyngeal - Nectar Syringe (None) Penetration/Aspiration details (nectar syringe) (None) Pharyngeal - Ice Chips (None) Penetration/Aspiration details (ice chips) (None) Pharyngeal - Thin Teaspoon (None) Penetration/Aspiration details (thin teaspoon) (None) Pharyngeal - Thin Cup (None) Penetration/Aspiration details (thin cup) (None) Pharyngeal - Thin Straw (None) Penetration/Aspiration details (thin straw) (None) Pharyngeal - Thin Syringe (None) Penetration/Aspiration details (thin syringe') (None) Pharyngeal - Puree (None) Penetration/Aspiration details (puree) (None) Pharyngeal - Mechanical Soft (None) Penetration/Aspiration details (mechanical soft) (None) Pharyngeal -  Regular (None) Penetration/Aspiration details (regular) (None) Pharyngeal - Multi-consistency (None) Penetration/Aspiration details (multi-consistency) (None) Pharyngeal - Pill (None) Penetration/Aspiration details (pill) (None) Pharyngeal Comment (None)   CHL IP CERVICAL ESOPHAGEAL PHASE 07/15/2015 Cervical Esophageal Phase WFL Pudding Teaspoon (None) Pudding Cup (None) Honey Teaspoon (None) Honey Cup (None) Honey Straw (None) Nectar Teaspoon (None) Nectar Cup (None) Nectar Straw (None) Nectar Sippy Cup (None) Thin Teaspoon (None) Thin Cup (None) Thin Straw (None) Thin Sippy Cup (None) Cervical Esophageal Comment (None) No flowsheet data found.   Juan Quam Laurice 07/20/2015, 2:37 PM   Dg Swallowing Func-speech Pathology  07/15/2015  Objective Swallowing Evaluation:   Modified Barium Swallow Patient Details Name: Jason [name] MRN: [id number] Date of Birth: 1986-06-10 Today's Date: 07/15/2015 Time: SLP Start Time (ACUTE ONLY): 1347-SLP Stop Time (ACUTE ONLY): 1405 SLP Time Calculation (min) (ACUTE ONLY): 18 min Past Medical History: Past Medical History Diagnosis Date . GERD (gastroesophageal reflux disease)  . Asthma  . Hx MRSA infection    on face . Gastroparesis  . Diabetic neuropathy ([place])  . Seizures ([place])  . Family history of anesthesia complication    Pt mother can't have epidural procedures . Dysrhythmia  . Pneumonia  . Arthritis  . Fibromyalgia  . Stroke ([place]) 01/29/2014   spinal  stroke ; "quadriplegic since" . Type I diabetes mellitus ([place])    sees Dr. Loanne Drilling  . Syncope 02/16/2015 Past Surgical History: Past Surgical History Procedure Laterality Date . Tonsillectomy   . Multiple extractions with alveoloplasty N/A 08/03/2014   Procedure: MULTIPLE EXTRACTIONS;  Surgeon: Gae Bon, DDS;  Location: [place];  Service: Oral Surgery;  Laterality: N/A; . Tee without cardioversion N/A 08/17/2014   Procedure: TRANSESOPHAGEAL ECHOCARDIOGRAM (TEE);  Surgeon: Dorothy Spark, MD;  Location: [facility] ENDOSCOPY;   Service: Cardiovascular;  Laterality: N/A; . Debridment of decubitus ulcer N/A 10/04/2014   Procedure: DEBRIDMENT OF DECUBITUS ULCER;  Surgeon: Georganna Skeans, MD;  Location: [place];  Service: General;  Laterality: N/A; . Laparoscopic diverted colostomy N/A 10/12/2014   Procedure: LAPAROSCOPIC DIVERTING COLOSTOMY;  Surgeon: Donnie Mesa, MD;  Location: [place];  Service: General;  Laterality: N/A; . Insertion of suprapubic catheter N/A 10/12/2014   Procedure: INSERTION OF SUPRAPUBIC CATHETER;  Surgeon: Reece Packer, MD;  Location: [place];  Service: Urology;  Laterality: N/A; . Incision and drainage of wound N/A 11/12/2014   Procedure: IRRIGATION AND DEBRIDEMENT OF WOUNDS WITH BONE BIOPSY AND SURGICAL PREP ;  Surgeon: Theodoro Kos, DO;  Location: [place];  Service: Plastics;  Laterality: N/A; . Application of a-cell of back N/A 11/12/2014   Procedure: APPLICATION A CELL AND VAC ;  Surgeon: Theodoro Kos, DO;  Location: [place];  Service: Plastics;  Laterality: N/A; . Incision and drainage of wound N/A 11/18/2014   Procedure: IRRIGATION AND DEBRIDEMENT OF SACRAL ULCER ONLY WITH PLACEMENT OF A CELL AND VAC/ DRESSING CHANGE TO UPPER BACK AREA.;  Surgeon: Theodoro Kos, DO;  Location: [place];  Service: Plastics;  Laterality: N/A; . Incision and drainage of wound N/A 11/25/2014   Procedure: IRRIGATION AND DEBRIDEMENT OF SACRAL ULCER AND BACK BURN WITH PLACEMENT OF A-CELL;  Surgeon: Theodoro Kos, DO;  Location: [place];  Service: Plastics;  Laterality: N/A; . Minor application of wound vac N/A 11/25/2014   Procedure:  WOUND VAC CHANGE;  Surgeon: Theodoro Kos, DO;  Location: [place];  Service: Plastics;  Laterality: N/A; . Incision and drainage of wound Right 12/08/2014   Procedure: IRRIGATION AND DEBRIDEMENT SACRAL WOUND AND RIGHT ISCHIAL WOUND ;  Surgeon: Theodoro Kos, DO;  Location: [place];  Service: Plastics;  Laterality: Right; . Application of a-cell of back N/A 12/08/2014   Procedure: APPLICATION OF A-CELL AND WOUND VAC ;  Surgeon:  Theodoro Kos, DO;  Location: [place];  Service: Plastics;  Laterality: N/A; . Tee without cardioversion N/A 05/05/2015   Procedure: TRANSESOPHAGEAL ECHOCARDIOGRAM (TEE);  Surgeon: Lelon Perla, MD;  Location: [place];  Service: Cardiovascular;  Laterality: N/A; . Incision and drainage of wound Bilateral 05/05/2015   Procedure: IRRIGATION AND DEBRIDEMENT SACRAL ULCER;  Surgeon: Theodoro Kos, DO;  Location: [place];  Service: Plastics;  Laterality: Bilateral; . Hematoma evacuation N/A 05/05/2015   Procedure: EVACUATION HEMATOMA bedside procedure;  Surgeon: Theodoro Kos, DO;  Location: [place];  Service: Plastics;  Laterality: N/A; HPI: Other Pertinent Information: 29 y.o. male with history of spinal cord injury status post paraplegia who was recently admitted for MRSA and microaerophilic strep bacteremia with respiratory failure requiring trach; nocturnal vent. Admitted due to AMS. Found to have UTI, hypothermia, hypotension, toxic metabolic encephalopathy. In August 2016 pt seen for in-line Passy-Muir Speaking valve, trach'd and continued with PMSV. MBS 05/2015 recommended NPO, received PEG. Today repeat MBS for possible initiation of food/liquid. He had Speaking valve from prior admission  and currently in place.  No Data Recorded Assessment / Plan / Recommendation CHL IP CLINICAL IMPRESSIONS 07/15/2015 Therapy Diagnosis Mild oral phase dysphagia;Mild pharyngeal phase dysphagia;Moderate pharyngeal phase dysphagia Clinical Impression MBS performed with PMSV in place. Mild prolonged mastication and oral transit with solid texture. Pharyngeal phase characterized by motor impairments with silent laryngeal penetration with nectar with chin tuck posture increasing amount and depth of penetration. Mild-mod vallecular and pyriform sinus residue reduced with verbal cues for second swallow. Honey thick liquids recommended with second swallow (aspiration risk to high during the swallow with nectar), Dys 2 diet, full assist for  feeding and precautions, swallow twice following bites/sips, wear Passy-Muir speaking valve with all meals/meds and upright posture, continued ST.     CHL IP TREATMENT RECOMMENDATION 07/15/2015 Treatment Recommendations Therapy as outlined in treatment plan below   CHL IP DIET RECOMMENDATION 07/15/2015 SLP Diet Recommendations Dysphagia 2 (Fine chop) Liquid Administration via (None) Medication Administration Whole meds with puree Compensations Slow rate;Small sips/bites;Multiple dry swallows after each bite/sip Postural Changes and/or Swallow Maneuvers (None)   CHL IP OTHER RECOMMENDATIONS 07/15/2015 Recommended Consults (None) Oral Care Recommendations Oral care BID Other Recommendations (None)   CHL IP FOLLOW UP RECOMMENDATIONS 05/27/2015 Follow up Recommendations 24 hour supervision/assistance   CHL IP FREQUENCY AND DURATION 07/15/2015 Speech Therapy Frequency (ACUTE ONLY) min 2x/week Treatment Duration 2 weeks   Pertinent Vitals/Pain none  SLP Swallow Goals No flowsheet data found. No flowsheet data found.   CHL IP REASON FOR REFERRAL 07/15/2015 Reason for Referral Objectively evaluate swallowing function       No flowsheet data found.   [name] 07/15/2015, 2:52 PM Orbie Pyo Colvin Caroli.Ed CCC-SLP Pager [phone]    CBC  Recent Labs Lab 08/02/15 0015 08/04/15 0419 08/05/15 2200 08/07/15 0650  WBC 16.5* 15.6* 21.8* 20.5*  HGB 8.3* 8.0* 8.6* 8.5*  HCT 27.2* 26.1* 27.1* 26.6*  PLT 914* 844* 894* 888*  MCV 86.1 85.6 85.0 85.0  MCH 26.3 26.2 27.0 27.2  MCHC 30.5 30.7 31.7 32.0  RDW 15.4 15.3 15.1 15.2    Chemistries   Recent Labs Lab 08/01/15 0541 08/02/15 0015 08/04/15 0419 08/05/15 0920 08/05/15 2200 08/07/15 0650 08/07/15 1045  NA 134* 131* 129*  --  130* 134*  --   K 4.8 4.8 4.5  --  4.7 6.2* 4.5  CL 103 100* 95*  --  96* 101  --   CO2 23 23 26   --  25 21*  --   GLUCOSE 159* 217* 123* 508* 165* 64*  --   BUN 16 11 12   --  10 10  --   CREATININE 0.76 0.66 0.58*  --  0.61  0.58*  --   CALCIUM 8.8* 8.6* 8.5*  --  9.0 9.2  --   MG  --   --   --   --   --  1.5*  --    ------------------------------------------------------------------------------------------------------------------ estimated creatinine clearance is 102 mL/min (by C-G formula based on Cr of 0.58). ------------------------------------------------------------------------------------------------------------------ No results for input(s): HGBA1C in the last 72 hours. ------------------------------------------------------------------------------------------------------------------ No results for input(s): CHOL, HDL, LDLCALC, TRIG, CHOLHDL, LDLDIRECT in the last 72 hours. ------------------------------------------------------------------------------------------------------------------ No results for input(s): TSH, T4TOTAL, T3FREE, THYROIDAB in the last 72 hours.  Invalid input(s): FREET3 ------------------------------------------------------------------------------------------------------------------ No results for input(s): VITAMINB12, FOLATE, FERRITIN, TIBC, IRON, RETICCTPCT in the last 72 hours.  Coagulation profile No results for input(s): INR, PROTIME in the last 168 hours.  No results for input(s): DDIMER in the last  72 hours.  Cardiac Enzymes No results for input(s): CKMB, TROPONINI, MYOGLOBIN in the last 168 hours.  Invalid input(s): CK ------------------------------------------------------------------------------------------------------------------ Invalid input(s): [place]  08/06/15 2027 08/06/15 2246 08/07/15 0310 08/07/15 0813 08/07/15 0905 08/07/15 1159  GLUCAP 106* 175* 126* 31* 112* 63*     Overton Boggus M.D. PhD [facility] 08/07/2015, 12:36 PM  Pager: [phone]  After 7pm go to [email or web address] - password [email or web address]  Call night coverage person covering after 7pm

## 2015-08-07 NOTE — Progress Notes (Signed)
Peripherally Inserted Central Catheter/Midline Placement  The IV Nurse has discussed with the patient and/or persons authorized to consent for the patient, the purpose of this procedure and the potential benefits and risks involved with this procedure.  The benefits include less needle sticks, lab draws from the catheter and patient may be discharged home with the catheter.  Risks include, but not limited to, infection, bleeding, blood clot (thrombus formation), and puncture of an artery; nerve damage and irregular heat beat.  Alternatives to this procedure were also discussed.  PICC/Midline Placement Documentation  PICC / Midline Single Lumen 08/07/15 PICC Right Brachial 38 cm 1 cm (Active)  Indication for Insertion or Continuance of Line Poor Vasculature-patient has had multiple peripheral attempts or PIVs lasting less than 24 hours 08/07/2015  3:32 PM  Exposed Catheter (cm) 1 cm 08/07/2015  3:32 PM  Site Assessment Clean;Dry;Intact 08/07/2015  3:32 PM  Line Status Flushed;Saline locked;Blood return noted 08/07/2015  3:32 PM  Dressing Change Due 08/14/15 08/07/2015  3:32 PM       [name] 08/07/2015, 3:33 PM

## 2015-08-07 NOTE — Progress Notes (Signed)
ANTIBIOTIC CONSULT NOTE - FOLLOW UP  Pharmacy Consult for Vancomycin Indication: Osteo  Allergies  Allergen Reactions  . Cefuroxime Axetil Anaphylaxis  . Ertapenem Other (See Comments)    Rash and confusion-->tolerated Imipenem   . Morphine And Related Other (See Comments)    Changed mental status, confusion, headache, visual hallucination  . Penicillins Anaphylaxis and Other (See Comments)    Tolerated Imipenem; no reaction to 7 day course of amoxicillin in 2015 Has patient had a PCN reaction causing immediate rash, facial/tongue/throat swelling, SOB or lightheadedness with hypotension: Yes Has patient had a PCN reaction causing severe rash involving mucus membranes or skin necrosis: No Has patient had a PCN reaction that required hospitalization Yes Has patient had a PCN reaction occurring within the last 10 years: No If all of the above answers are "NO", then may proceed with Cephalo  . Sulfa Antibiotics Anaphylaxis, Shortness Of Breath and Other (See Comments)  . Tessalon [Benzonatate] Anaphylaxis  . Shellfish Allergy Itching and Other (See Comments)    Took benadryl to alleviate reaction  . Nsaids Other (See Comments)    Risk of bleeding  . Miripirium Rash and Other (See Comments)    Change in mental status    Patient Measurements: Height: 5\' 7"  (170.2 cm) Weight: 118 lb 14.4 oz (53.933 kg) IBW/kg (Calculated) : 66.1 Adjusted Body Weight:    Vital Signs: Temp: 98.8 F (37.1 C) (10/30 0635) Temp Source: Oral (10/30 0635) BP: 92/66 mmHg (10/30 0635) Pulse Rate: 82 (10/30 0900) Intake/Output from previous day: 10/29 0701 - 10/30 0700 In: 1880 [P.O.:1140; IV Piggyback:300] Out: 3832 [Urine:4850; Stool:600] Intake/Output from this shift: Total I/O In: 235 [Other:85; IV Piggyback:150] Out: -   Labs:  Recent Labs  08/05/15 2200 08/07/15 0650  WBC 21.8* 20.5*  HGB 8.6* 8.5*  PLT 894* 888*  CREATININE 0.61 0.58*   Estimated Creatinine Clearance: 102 mL/min  (by C-G formula based on Cr of 0.58).  Recent Labs  08/07/15 1045  VANCOTROUGH 29*    Assessment: Infectious Disease: Sepsis. Large decubitus ulcers, recent MRSA and micro-aerophilic streptococcal bacteremia, respiratory failure/tracheostomy complicated by MDR Acinetobacter, MDR pseudomonas . 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 Tmax/24h 99.9. WBC up to 21.8>20.5.Was on doxy PTA.  Vancomycin 10/3 >10/12, 10/15 >10/24, 10/28>> 10/9 VT = 12  10/18 VT = 14 on 750mg  q24 10/22 VT 29 on 500mg  IV q12h (accurate level) 10/23 VR 17 (about 24 hours after 10/22 dose) -resumed 750mg /48h, order D/C'd. --(Calculated Vanco ke = 0.0222, half life 31 hours) JJF 10/30: VT 29 (accurate level)>>RN gave dose.  Primaxin 10/3 >> 10/11 Fosfomycin x 3 doses per ID  10/2 Blood - Negative 10/3 Blood - Negative 10/3 urine - > 100k of Pseudomonas (sensitive to Ceftaz and Amikacin only) 10/15 blood x 2>>NG final 10/19 blood x 2 >> negative 10/21 Urine>> PSA ( I to Cefepime/Fortaz) 10/27 Sacral tissue>>negative   Goal of Therapy:  Vancomycin trough level 15-20 mcg/ml  Plan:  Hold Vancomycin (trough 29 + another dose given by RN today) Random level in AM assess clearance.  Malai Lady S. Alford Highland, PharmD, BCPS Clinical Staff Pharmacist Pager [phone]  Wayland Salinas 08/07/2015,2:57 PM

## 2015-08-07 NOTE — Progress Notes (Signed)
CRITICAL VALUE ALERT  Critical value received:Potassium = 6.2  Date of notification:  08/07/15  Time of notification:  0845 Critical value read backyes  Nurse who received alert: Jeralene Peters, RN  MD notified (1st page):  Erlinda Hong, F  Time of first page:  0905 MD notified (2nd page):  Time of second page:  Responding MD: Dillard Cannon  Time MD responded: [phone]

## 2015-08-08 DIAGNOSIS — Z8619 Personal history of other infectious and parasitic diseases: Secondary | ICD-10-CM | POA: Insufficient documentation

## 2015-08-08 LAB — BASIC METABOLIC PANEL
ANION GAP: 10 (ref 5–15)
BUN: 10 mg/dL (ref 6–20)
CO2: 26 mmol/L (ref 22–32)
Calcium: 8.7 mg/dL — ABNORMAL LOW (ref 8.9–10.3)
Chloride: 98 mmol/L — ABNORMAL LOW (ref 101–111)
Creatinine, Ser: 0.61 mg/dL (ref 0.61–1.24)
GFR calc Af Amer: >60 mL/min (ref >60)
GLUCOSE: 280 mg/dL — AB (ref 65–99)
POTASSIUM: 5.1 mmol/L (ref 3.5–5.1)
Sodium: 134 mmol/L — ABNORMAL LOW (ref 135–145)

## 2015-08-08 LAB — CBC
HEMATOCRIT: 25.3 % — AB (ref 39.0–52.0)
Hemoglobin: 8.1 g/dL — ABNORMAL LOW (ref 13.0–17.0)
MCH: 27.6 pg (ref 26.0–34.0)
MCHC: 32 g/dL (ref 30.0–36.0)
MCV: 86.1 fL (ref 78.0–100.0)
PLATELETS: 806 10*3/uL — AB (ref 150–400)
RBC: 2.94 MIL/uL — AB (ref 4.22–5.81)
RDW: 15 % (ref 11.5–15.5)
WBC: 21.8 10*3/uL — AB (ref 4.0–10.5)

## 2015-08-08 LAB — GLUCOSE, CAPILLARY
GLUCOSE-CAPILLARY: 211 mg/dL — AB (ref 65–99)
GLUCOSE-CAPILLARY: 190 mg/dL — AB (ref 65–99)
GLUCOSE-CAPILLARY: 225 mg/dL — AB (ref 65–99)
Glucose-Capillary: 238 mg/dL — ABNORMAL HIGH (ref 65–99)
Glucose-Capillary: 300 mg/dL — ABNORMAL HIGH (ref 65–99)

## 2015-08-08 LAB — VANCOMYCIN, RANDOM: Vancomycin Rm: 23 ug/mL

## 2015-08-08 MED ORDER — METRONIDAZOLE 500 MG PO TABS
500.0000 mg | ORAL_TABLET | Freq: Three times a day (TID) | ORAL | Status: DC
  Administered 2015-08-08 – 2015-08-09 (×4): 500 mg via ORAL
  Filled 2015-08-08 (×4): qty 1

## 2015-08-08 MED ORDER — VANCOMYCIN HCL 500 MG IV SOLR
500.0000 mg | INTRAVENOUS | Status: DC
  Administered 2015-08-08: 500 mg via INTRAVENOUS
  Filled 2015-08-08 (×3): qty 500

## 2015-08-08 MED ORDER — AZTREONAM 1 G IJ SOLR
1.0000 g | Freq: Three times a day (TID) | INTRAMUSCULAR | Status: DC
  Administered 2015-08-08 – 2015-08-09 (×4): 1 g via INTRAVENOUS
  Filled 2015-08-08 (×5): qty 1

## 2015-08-08 NOTE — Progress Notes (Addendum)
ANTIBIOTIC CONSULT NOTE - FOLLOW UP  Pharmacy Consult for Vancomycin Indication: Osteo  Allergies  Allergen Reactions  . Cefuroxime Axetil Anaphylaxis  . Ertapenem Other (See Comments)    Rash and confusion-->tolerated Imipenem   . Morphine And Related Other (See Comments)    Changed mental status, confusion, headache, visual hallucination  . Penicillins Anaphylaxis and Other (See Comments)    Tolerated Imipenem; no reaction to 7 day course of amoxicillin in 2015 Has patient had a PCN reaction causing immediate rash, facial/tongue/throat swelling, SOB or lightheadedness with hypotension: Yes Has patient had a PCN reaction causing severe rash involving mucus membranes or skin necrosis: No Has patient had a PCN reaction that required hospitalization Yes Has patient had a PCN reaction occurring within the last 10 years: No If all of the above answers are "NO", then may proceed with Cephalo  . Sulfa Antibiotics Anaphylaxis, Shortness Of Breath and Other (See Comments)  . Tessalon [Benzonatate] Anaphylaxis  . Shellfish Allergy Itching and Other (See Comments)    Took benadryl to alleviate reaction  . Nsaids Other (See Comments)    Risk of bleeding  . Miripirium Rash and Other (See Comments)    Change in mental status    Patient Measurements: Height: 5\' 7"  (170.2 cm) Weight: 121 lb 12.2 oz (55.23 kg) IBW/kg (Calculated) : 66.1   Vital Signs: Temp: 99.6 F (37.6 C) (10/31 0554) Temp Source: Oral (10/31 0554) BP: 125/83 mmHg (10/31 0554) Pulse Rate: 75 (10/31 0745) Intake/Output from previous day: 10/30 0701 - 10/31 0700 In: 540 [IV Piggyback:200] Out: 1650 [Urine:1650] Intake/Output from this shift:    Labs:  Recent Labs  08/05/15 2200 08/07/15 0650 08/08/15 0510  WBC 21.8* 20.5* 21.8*  HGB 8.6* 8.5* 8.1*  PLT 894* 888* 806*  CREATININE 0.61 0.58* 0.61   Estimated Creatinine Clearance: 104.5 mL/min (by C-G formula based on Cr of 0.61).  Recent Labs   08/07/15 1045 08/08/15 0510  VANCOTROUGH 29*  --   VANCORANDOM  --  23    Assessment: 73 YOM quadriplegic with complicated PMH here with sepsis- Large decubitus ulcers, recent MRSA and micro-aerophilic streptococcal bacteremia, respiratory failure/tracheostomy complicated by MDR Acinetobacter, MDR pseudomonas . 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 Tmax/24h 100, WBC up to 21.8.  Vancomycin 10/3 >10/12, 10/15 >10/24, 10/28>> 10/9 VT = 12  10/18 VT = 14 on 750mg  q24 10/22 VT 29 on 500mg  IV q12h (accurate level) 10/23 VR 17 (about 24 hours after 10/22 dose) -resumed 750mg  q48h- stopped after 1 dose, then vanc restarted at 750mg  q12h 10/30: VT 29 (accurate level)>>RN gave dose. 10/31 VR = 23 *difficult to calculate kinetics as a dose was given between collection of the 2 levels  Primaxin 10/3 >> 10/11 Fosfomycin x 3 doses per ID  10/2 Blood: Negative 10/3 Blood: Negative 10/3 urine: >100k of Pseudomonas (sensitive to Ceftaz and Amikacin only) 10/15 blood: negative 10/19 blood: negative 10/21 Urine: PSA ( I to Cefepime/Fortaz) 10/27 Sacral tissue: multiple organisms  SCr 0.61, patient is quadriplegic so this is not always reflective of true CrCl. Charted UOP dropped from 4894mL to 1652mL- patient with suprapubic cath.  Goal of Therapy:  Vancomycin trough level 15-20 mcg/ml  Plan:  -vancomycin 500mg  IV q24h starting at 1600- based on linear model and how much level decreased by, estimate it will take ~30h from last dose for level to be ~19. Also, with drop off in UOP will allow longer than 24h for dose  to clear -VT at new SS -follow c/s, renal function, clinical progression  Griffey Nicasio D. Quanda Pavlicek, PharmD, BCPS Clinical Pharmacist Pager: [phone] 08/08/2015 8:31 AM  ADDENDUM To add aztreonam and metronidazole to abx regimen.  Plan: -aztreonam 1g IV q8h -metronidazole 500mg  PO q8h per MD -rest of plan as above. Follow for any other  changes made by ID  Rondalyn Belford D. Emmogene Simson, PharmD, BCPS Clinical Pharmacist Pager: [phone] 08/08/2015 9:41 AM

## 2015-08-08 NOTE — Progress Notes (Addendum)
Calorie Count Note  48 hour calorie count ordered.  S/p Procedure(s) (LRB) on 08/04/15: IRRIGATION AND DEBRIDEMENT OF SACRAL ULCER AND (N/A) APPLICATION OF A-CELL (N/A)  Pt with multiple wounds, including:   Left plantar heel with stage 2 pressure injury Left plantar foot near heel with unstageable pressure injury Left anterior foot near small toe stage 2 pressure injury  Left outer foot stage 2 pressure injury  Right ischium with stage 4 pressure injury Left ischium with stage 4 pressure injury Left hip with stage 4 pressure injury Left posterior back with chronic full thickness wound   Spoke with RN, who reports TF were re-started on 08/06/15, at request of family. RN reports pt continues to tolerate TF well, however, pt continues to have persistent hypoglycemia.   RN shared concern that while pt does order a lot of food on meal trays (ex. Pt ordered 2 bowls of oatmeal and 6 boiled eggs at breakfast), she is doubtful that pt consumes large quantities of food himself. Discussed case with RN and discussed concern about pt meeting nutrient needs due to increased needs (especially protein) due to multiple wounds.   Calorie count data incomplete, however, pt likely not meeting 90% of his nutritional needs by PO's alone. RD recommends continuance of nocturnal feedings to ensure nutritional adequacy.   Noted formula was changed from Vital AF 1.2 to Vital 1.0 since being reinstated. Due to increased PO intake, agree with formula change. RD will continue to follow for tolerance and nutritional adequacy.   Diet: Regular Supplements: nocturnal feeding regimen of Vital AF 1.0 via PEG (6 PM to 6 AM) providing 720 kcals, 29 gm protein, 606 ml of free water  Breakfast: 900 kcals/ 19 grams protein Lunch: 762 kcals/ 46 grams protein Dinner: n/a Supplements: 3 cans of Gingerale (0 kcals/ 0 grams protein)  Total intake (PO only): 1662 kcal (87% of minimum estimated needs)  75 protein (79% of  minimum estimated needs)  Total intake  (with TF): 2382 kcal (>100% of minimum estimated needs)  94 protein (99% of minimum estimated needs)  Nutrition Dx: Inadequate oral intake related to chronic illness as evidenced by (PO intake </= 50%); ogoing  Goal: Patient will meet greater than or equal to 90% of their needs; progressing  Intervention:    Continue MVI  Continue regular diet  Continue Vital AF 1.0 formula at 60 ml/hr from 6 PM to 6 AM. Total time is 12 hours.  Terria Deschepper [name], RD, LDN, CDE Pager: [phone] After hours Pager: [phone]

## 2015-08-08 NOTE — Progress Notes (Signed)
[facility]    Subjective:   No new complaints  Antibiotics:  Anti-infectives    Start     Dose/Rate Route Frequency Ordered Stop   08/09/15 1100  vancomycin (VANCOCIN) IVPB 750 mg/150 ml premix  Status:  Discontinued     750 mg 150 mL/hr over 60 Minutes Intravenous Every 48 hours 08/07/15 1455 08/07/15 1458   08/08/15 1600  vancomycin (VANCOCIN) 500 mg in sodium chloride 0.9 % 100 mL IVPB     500 mg 100 mL/hr over 60 Minutes Intravenous Every 24 hours 08/08/15 0831     08/08/15 1000  metroNIDAZOLE (FLAGYL) tablet 500 mg     500 mg Oral 3 times per day 08/08/15 0938     08/08/15 1000  aztreonam (AZACTAM) 1 g in dextrose 5 % 50 mL IVPB     1 g 100 mL/hr over 30 Minutes Intravenous Every 8 hours 08/08/15 0941     08/05/15 1100  vancomycin (VANCOCIN) IVPB 750 mg/150 ml premix  Status:  Discontinued     750 mg 150 mL/hr over 60 Minutes Intravenous Every 12 hours 08/05/15 1015 08/07/15 1229   08/01/15 1000  fluconazole (DIFLUCAN) tablet 200 mg  Status:  Discontinued     200 mg Oral Daily 08/01/15 0839 08/01/15 1342   07/31/15 1500  vancomycin (VANCOCIN) IVPB 750 mg/150 ml premix  Status:  Discontinued     750 mg 150 mL/hr over 60 Minutes Intravenous Every 48 hours 07/31/15 1322 08/01/15 1342   07/27/15 1800  vancomycin (VANCOCIN) 500 mg in sodium chloride 0.9 % 100 mL IVPB     500 mg 100 mL/hr over 60 Minutes Intravenous Every 12 hours 07/27/15 0048 07/29/15 1846   07/23/15 2300  vancomycin (VANCOCIN) IVPB 750 mg/150 ml premix  Status:  Discontinued     750 mg 150 mL/hr over 60 Minutes Intravenous Every 24 hours 07/23/15 2215 07/27/15 0048   07/18/15 0000  vancomycin (VANCOCIN) IVPB 750 mg/150 ml premix     750 mg 150 mL/hr over 60 Minutes Intravenous Every 24 hours 07/17/15 0508 07/19/15 0123   07/17/15 0100  vancomycin (VANCOCIN) 500 mg in sodium chloride 0.9 % 100 mL IVPB  Status:  Discontinued     500 mg 100 mL/hr over 60  Minutes Intravenous Every 24 hours 07/16/15 1016 07/17/15 0507   07/13/15 2000  imipenem-cilastatin (PRIMAXIN) 250 mg in sodium chloride 0.9 % 100 mL IVPB     250 mg 200 mL/hr over 30 Minutes Intravenous 3 times per day 07/13/15 1046 07/19/15 2256   07/11/15 2200  imipenem-cilastatin (PRIMAXIN) 250 mg in sodium chloride 0.9 % 100 mL IVPB  Status:  Discontinued     250 mg 200 mL/hr over 30 Minutes Intravenous Every 12 hours 07/11/15 1020 07/13/15 1046   07/10/15 2230  imipenem-cilastatin (PRIMAXIN) 250 mg in sodium chloride 0.9 % 100 mL IVPB  Status:  Discontinued     250 mg 200 mL/hr over 30 Minutes Intravenous 3 times per day 07/10/15 2226 07/11/15 1020   07/10/15 2230  vancomycin (VANCOCIN) 500 mg in sodium chloride 0.9 % 100 mL IVPB  Status:  Discontinued     500 mg 100 mL/hr over 60 Minutes Intravenous Every 24 hours 07/10/15 2226 07/16/15 1016      Medications: Scheduled Meds: . aztreonam  1 g Intravenous Q8H  . baclofen  10 mg Oral 2 times per day  . baclofen  20 mg Oral QHS  . collagenase   Topical Daily  . enoxaparin (LOVENOX) injection  40 mg Subcutaneous Daily  . fentaNYL  50 mcg Transdermal Q72H  . Influenza vac split quadrivalent PF  0.5 mL Intramuscular Tomorrow-1000  . insulin aspart  0-15 Units Subcutaneous TID WC  . insulin aspart  0-5 Units Subcutaneous QHS  . insulin aspart  3 Units Subcutaneous TID WC  . insulin glargine  15 Units Subcutaneous Daily  . metroNIDAZOLE  500 mg Oral 3 times per day  . midodrine  30 mg Oral TID WC  . multivitamin with minerals  1 tablet Oral Daily  . pantoprazole sodium  40 mg Oral Daily  . pregabalin  100 mg Oral 3 times per day  . pregabalin  200 mg Oral QHS  . sodium chloride  10-40 mL Intracatheter Q12H  . vancomycin  500 mg Intravenous Q24H   Continuous Infusions: . sodium chloride 10 mL/hr at 08/05/15 1038  . feeding supplement (VITAL 1.0 CAL) 1,000 mL (08/08/15 1835)  . lactated ringers 10 mL/hr at 08/04/15 1721   PRN  Meds:.acetaminophen (TYLENOL) oral liquid 160 mg/5 mL, albuterol, diphenhydrAMINE-zinc acetate, LORazepam, mirtazapine, ondansetron **OR** ondansetron (ZOFRAN) IV, oxyCODONE, RESOURCE THICKENUP CLEAR, sodium chloride    Objective: Weight change: 2 lb 13.8 oz (1.297 kg)  Intake/Output Summary (Last 24 hours) at 08/08/15 1855 Last data filed at 08/08/15 1853  Gross per 24 hour  Intake    840 ml  Output   4750 ml  Net  -3910 ml   Blood pressure 113/75, pulse 77, temperature 98.6 F (37 C), temperature source Oral, resp. rate 16, height 5\' 7"  (1.702 m), weight 121 lb 12.2 oz (55.23 kg), SpO2 100 %. Temp:  [98.6 F (37 C)-100 F (37.8 C)] 98.6 F (37 C) (10/31 1445) Pulse Rate:  [75-90] 77 (10/31 1524) Resp:  [16-19] 16 (10/31 1524) BP: (113-148)/(75-93) 113/75 mmHg (10/31 1445) SpO2:  [96 %-100 %] 100 % (10/31 1524) FiO2 (%):  [21 %] 21 % (10/31 1524) Weight:  [121 lb 12.2 oz (55.23 kg)] 121 lb 12.2 oz (55.23 kg) (10/31 0554)  Physical Exam: General: Alert and awake, oriented eating lunch CV: rrr no mgr Lungs:better aiway movement today throughout  SKin not examined today  Urine seems clearer in bag  07/27/15:      Neuro: no new deficits  CBC: CBC Latest Ref Rng 08/08/2015 08/07/2015 08/05/2015  WBC 4.0 - 10.5 K/uL 21.8(H) 20.5(H) 21.8(H)  Hemoglobin 13.0 - 17.0 g/dL 8.1(L) 8.5(L) 8.6(L)  Hematocrit 39.0 - 52.0 % 25.3(L) 26.6(L) 27.1(L)  Platelets 150 - 400 K/uL 806(H) 888(H) 894(H)       BMET  Recent Labs  08/07/15 0650 08/07/15 1045 08/08/15 0510  NA 134*  --  134*  K 6.2* 4.5 5.1  CL 101  --  98*  CO2 21*  --  26  GLUCOSE 64*  --  280*  BUN 10  --  10  CREATININE 0.58*  --  0.61  CALCIUM 9.2  --  8.7*     Liver Panel  No results for input(s): PROT, ALBUMIN, AST, ALT, ALKPHOS, BILITOT, BILIDIR, IBILI in the last 72 hours.     Sedimentation Rate No results for input(s): ESRSEDRATE in the last 72 hours. C-Reactive Protein No results for  input(s): CRP in the last 72 hours.  Micro Results: Recent Results (from the past 720 hour(s))  Blood culture (routine x 2)     Status: None   Collection Time:  07/10/15  4:25 PM  Result Value Ref Range Status   Specimen Description BLOOD LEFT ARM  Final   Special Requests BOTTLES DRAWN AEROBIC AND ANAEROBIC 5CC  Final   Culture NO GROWTH 5 DAYS  Final   Report Status 07/15/2015 FINAL  Final  MRSA PCR Screening     Status: None   Collection Time: 07/11/15  1:00 AM  Result Value Ref Range Status   MRSA by PCR NEGATIVE NEGATIVE Final    Comment:        The GeneXpert MRSA Assay (FDA approved for NASAL specimens only), is one component of a comprehensive MRSA colonization surveillance program. It is not intended to diagnose MRSA infection nor to guide or monitor treatment for MRSA infections.   Blood culture (routine x 2)     Status: None   Collection Time: 07/11/15  4:19 AM  Result Value Ref Range Status   Specimen Description BLOOD RIGHT FOREARM  Final   Special Requests BOTTLES DRAWN AEROBIC ONLY 2CC  Final   Culture NO GROWTH 5 DAYS  Final   Report Status 07/16/2015 FINAL  Final  Urine culture     Status: None   Collection Time: 07/11/15  5:09 PM  Result Value Ref Range Status   Specimen Description URINE, RANDOM  Final   Special Requests NONE  Final   Culture   Final    >=100,000 COLONIES/mL PSEUDOMONAS AERUGINOSA AMIKACIN MIC=8 SENSITIVE COLISTIN MIC=1 SENSITIVE >=100,000 COLONIES/mL YEAST    Report Status 07/27/2015 FINAL  Final   Organism ID, Bacteria PSEUDOMONAS AERUGINOSA  Final      Susceptibility   Pseudomonas aeruginosa - MIC*    CEFTAZIDIME 8 SENSITIVE Sensitive     CIPROFLOXACIN >=4 RESISTANT Resistant     GENTAMICIN >=16 RESISTANT Resistant     IMIPENEM >=16 RESISTANT Resistant     CEFEPIME 16 INTERMEDIATE Intermediate     AZTREONAM Value in next row       >=16RESISTANT    * >=100,000 COLONIES/mL PSEUDOMONAS AERUGINOSA  Culture, blood (routine x 2)      Status: None   Collection Time: 07/23/15 10:35 PM  Result Value Ref Range Status   Specimen Description BLOOD RIGHT ARM  Final   Special Requests IN PEDIATRIC BOTTLE 3CC  Final   Culture NO GROWTH 5 DAYS  Final   Report Status 07/28/2015 FINAL  Final  Culture, blood (routine x 2)     Status: None   Collection Time: 07/23/15 10:45 PM  Result Value Ref Range Status   Specimen Description BLOOD RIGHT ANTECUBITAL  Final   Special Requests BOTTLES DRAWN AEROBIC AND ANAEROBIC 5CC   Final   Culture NO GROWTH 5 DAYS  Final   Report Status 07/28/2015 FINAL  Final  Culture, blood (routine x 2)     Status: None   Collection Time: 07/27/15  7:05 AM  Result Value Ref Range Status   Specimen Description BLOOD RIGHT HAND  Final   Special Requests BOTTLES DRAWN AEROBIC ONLY 3CC  Final   Culture NO GROWTH 5 DAYS  Final   Report Status 08/01/2015 FINAL  Final  Culture, blood (routine x 2)     Status: None   Collection Time: 07/27/15  7:10 AM  Result Value Ref Range Status   Specimen Description BLOOD RIGHT ARM  Final   Special Requests BOTTLES DRAWN AEROBIC ONLY 3CC  Final   Culture NO GROWTH 5 DAYS  Final   Report Status 08/01/2015 FINAL  Final  Culture, Urine  Status: None (Preliminary result)   Collection Time: 07/29/15  6:06 PM  Result Value Ref Range Status   Specimen Description URINE, SUPRAPUBIC  Final   Special Requests Normal  Final   Culture   Final    >=100,000 COLONIES/mL PSEUDOMONAS AERUGINOSA AMIKACIN MIC=8 SENSITIVE FOSFOMYCIN MIC=>1024 RESISTANT AVYCAZ=22 SENSITIVE ZERBAXA=1 SENSITIVE    Report Status PENDING  Incomplete   Organism ID, Bacteria PSEUDOMONAS AERUGINOSA  Final      Susceptibility   Pseudomonas aeruginosa - MIC*    CEFTAZIDIME 16 INTERMEDIATE Intermediate     CIPROFLOXACIN >=4 RESISTANT Resistant     GENTAMICIN >=16 RESISTANT Resistant     IMIPENEM >=16 RESISTANT Resistant     CEFEPIME 16 INTERMEDIATE Intermediate     * >=100,000 COLONIES/mL  PSEUDOMONAS AERUGINOSA  Anaerobic culture     Status: None (Preliminary result)   Collection Time: 08/04/15  7:08 PM  Result Value Ref Range Status   Specimen Description TISSUE LEFT SACRAL  Final   Special Requests NONE  Final   Gram Stain   Final    NO WBC SEEN NO ORGANISMS SEEN Performed at [facility]    Culture   Final    NO ANAEROBES ISOLATED; CULTURE IN PROGRESS FOR 5 DAYS Performed at [facility]    Report Status PENDING  Incomplete  Fungus Culture with Smear     Status: None (Preliminary result)   Collection Time: 08/04/15  7:11 PM  Result Value Ref Range Status   Specimen Description TISSUE LEFT SACRAL  Final   Special Requests NONE  Final   Fungal Smear   Final    NO YEAST OR FUNGAL ELEMENTS SEEN Performed at [facility]    Culture   Final    CULTURE IN PROGRESS FOR FOUR WEEKS Performed at [facility]    Report Status PENDING  Incomplete  AFB culture with smear     Status: None (Preliminary result)   Collection Time: 08/04/15  7:13 PM  Result Value Ref Range Status   Specimen Description TISSUE LEFT SACRAL  Final   Special Requests NONE  Final   Acid Fast Smear   Final    NO ACID FAST BACILLI SEEN Performed at [facility]    Culture   Final    CULTURE WILL BE EXAMINED FOR 6 WEEKS BEFORE ISSUING A FINAL REPORT Performed at [facility]    Report Status PENDING  Incomplete  Tissue culture     Status: None   Collection Time: 08/04/15  7:18 PM  Result Value Ref Range Status   Specimen Description TISSUE LEFT SACRAL  Final   Special Requests NONE  Final   Gram Stain   Final    NO WBC SEEN NO ORGANISMS SEEN Performed at [facility]    Culture   Final    MULTIPLE ORGANISMS PRESENT, NONE PREDOMINANT Note: NO GROUP A STREP (S.PYOGENES) ISOLATED NO STAPHYLOCOCCUS AUREUS ISOLATED Performed at [facility]    Report Status 08/07/2015 FINAL  Final    Studies/Results: No results  found.    Assessment/Plan:  INTERVAL HISTORY:   10/17/16Patients wounds with improved appearance per [facility]  07/26/15" CT scan concerning for worsening osteomyelitis in pelvis 07/27/15: copious secretions suctioned 07/29/15: Plastic Surgery plans surgery next Thursday 08/01/15: Vanco stopped by me. Urology saw pt and it is believed that suprapubic was already changed after culture with yeast was obtained 08/04/15: pt taken to OR for I and D cultures, acell application  by Dr. Berna Bue,  08/05/15: Tissue cultures with mixed flora- I asked micro to see if any could be worked up, urine from 10 days ago grew pseudmonas that was R to the fosfomycin that was used to treat it, S to zerbaxa, avycaz and amikacin   Principal Problem:   Acute encephalopathy Active Problems:   Palliative care encounter   Chronic anemia   Uncontrolled type 1 diabetes mellitus with hyperglycemia ([place])   Altered mental status   Tracheostomy dependent ([place])   H/O spinal cord injury   Chronic paraplegia ([name])   Acute on chronic respiratory failure ([name])   Hypomagnesemia   Decubitus ulcer of right perineal ischial region, stage 4 ([name])   Decubitus ulcer of left perineal ischial region, stage 4 ([name])   Decubitus ulcer of sacral region, stage 4 ([name])   Complicated urinary tract infection   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  Looks to be NEW and due to infection progressing  Greatly appreciate  Dr. Eusebio Friendly help to take him to the OR for debridement cultures to help  [name] in ID if we have [place], TISSUE to help guide therapy esp give his hx of MDRO  restartedvancomycin and adding back Gram negative coverage with aztreonam and flagyl orally  I have asked micro to workup species from tissue culture if possible  Hopefully there are no MDR pathogens in the tissue from the OR   #2 Recurrent pseudmonas in urine turns out to be R to  Fosfomycin but seems clinically better. It has been 10 days since that culture grew and perhaps he has cleared infection with exchange of catheter and his own immune system. Clinically he seems improved.  If he worsens and there is suspicion for recurrence of UTI would reculture urine and start IV Amikacin. He has PCN and CEPH allergy that would require desensitization to zerbaxa or avycaz in the ICU   #3 Funguria: changing the catheter should eradicate this problem  #4 Pleural effusions: chronic , if he does nto improve after we target his osteomyelitis could consider tapping effusion for cell coutn diff, ldh and cutlures  Dr. Linus Salmons is taking over the service tomorrow.         LOS: 29 days   [name] 08/08/2015, 6:55 PM

## 2015-08-08 NOTE — Progress Notes (Signed)
[facility]                                                                              Patient Demographics  Jason [name], is a 29 y.o. male, DOB - 25-Oct-1985, YPP:[id number]  Admit date - 07/10/2015   Admitting Physician [name], MD  Outpatient Primary MD for the patient is [name], MD  LOS - 32   Chief Complaint  Patient presents with  . Altered Mental Status       Brief HPI   29 y.o. 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 Patient was recently tapered off his IV antibiotics and was placed on doxycycline and Cipro 3 days prior to this admit for possible UTI.   In the ER patient UA shows features concerning for UTI - patient has chronic suprapubic catheter. Patient was also found to be hypothermic and hypotensive. Patient has had previous history of multiple debridement for chronic sacral decubitus which at this time looks to be healing. Patient also has history of chronic osteomyelitis. S/p biopsy by plastics  Assessment & Plan   Principal problem Toxic metabolic encephalopathy Resolved - patient is presently alert and oriented  Acute problem Sepsis due to MDR Pseudomonas Aeruginosa UTI ? Vs decubitus/osteomyelitis - suprapubic catheter changed by urology - ID and plastics following: CT Scan showed: Sacral decubitus ulcer with prior distal sacrococcygeal destruction or resection and foci of gas within the LEFT gluteus maximus question infection. - Dr. Migdalia Dk.s/p biopsy on 10/27- cultures with multiple species Patient reports Dr. Drucilla Schmidt came by earlier today but there is no note yet. I see antibiotics have changed to aztreonam Flagyl and vancomycin. Pseudomonas in urine is  resistant to fosfomycin. Will discuss with ID.  Acute renal failure Due to sepsis -resolved  Hyponatremia  Mild, improving  Hypomagnesemia: replace mag  Diabetes mellitus type 1 with hyperglycemia Blood glucoses labile.   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 S/p bone biopsy, acell, vac  Chronic anemia H&H currently stable   Chronic respiratory failure with trach and vent pulmonary critical care supposed to be managing vent - it appears the patient took himself off the vent 10/7 and has not been back on since - currently appears stable on 28% trach collar  - As per PCCM ELink note 10/3, it appears that patient is followed in OP trach clinic> will defer further trach decisions to OP follow up in that clinic. - Dr. Eliseo Squires discussed with CCM M.D. on call 10/16: Agrees with outpatient follow-up with trach clinic and indicates that he may not be decannulated.   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 IVC filter placed 8/6  History of spinal cord injury with paraplegia  Severe malnutrition in context of chronic illness On night time TF  DVT Prophylaxis: Lovenox   Code Status: Full CODE STATUS  Family Communication: mother   Disposition Plan: Medicaid has no LTAC coverage  Time Spent  in minutes  25 minutes  Procedures  None  Consults    PCCM   Infectious disease  Plastics- [place]   Medications  Scheduled Meds: . aztreonam  1 g Intravenous Q8H  . baclofen  10 mg Oral 2 times per day  . baclofen  20 mg Oral QHS  . collagenase   Topical Daily  . enoxaparin (LOVENOX) injection  40 mg Subcutaneous Daily  . fentaNYL  50 mcg Transdermal Q72H  . Influenza vac split quadrivalent PF  0.5 mL Intramuscular Tomorrow-1000  . insulin aspart  0-15 Units Subcutaneous TID WC  . insulin aspart  0-5 Units Subcutaneous QHS  . insulin aspart  3 Units Subcutaneous TID WC  . insulin glargine  15 Units Subcutaneous Daily  . metroNIDAZOLE  500 mg Oral 3 times per day  . midodrine  30  mg Oral TID WC  . multivitamin with minerals  1 tablet Oral Daily  . pantoprazole sodium  40 mg Oral Daily  . pregabalin  100 mg Oral 3 times per day  . pregabalin  200 mg Oral QHS  . sodium chloride  10-40 mL Intracatheter Q12H  . vancomycin  500 mg Intravenous Q24H   Continuous Infusions: . sodium chloride 10 mL/hr at 08/05/15 1038  . feeding supplement (VITAL 1.0 CAL)    . lactated ringers 10 mL/hr at 08/04/15 1721   PRN Meds:.acetaminophen (TYLENOL) oral liquid 160 mg/5 mL, albuterol, diphenhydrAMINE-zinc acetate, LORazepam, mirtazapine, ondansetron **OR** ondansetron (ZOFRAN) IV, oxyCODONE, RESOURCE THICKENUP CLEAR, sodium chloride   Antibiotics   Anti-infectives    Start     Dose/Rate Route Frequency Ordered Stop   08/09/15 1100  vancomycin (VANCOCIN) IVPB 750 mg/150 ml premix  Status:  Discontinued     750 mg 150 mL/hr over 60 Minutes Intravenous Every 48 hours 08/07/15 1455 08/07/15 1458   08/08/15 1600  vancomycin (VANCOCIN) 500 mg in sodium chloride 0.9 % 100 mL IVPB     500 mg 100 mL/hr over 60 Minutes Intravenous Every 24 hours 08/08/15 0831     08/08/15 1000  metroNIDAZOLE (FLAGYL) tablet 500 mg     500 mg Oral 3 times per day 08/08/15 0938     08/08/15 1000  aztreonam (AZACTAM) 1 g in dextrose 5 % 50 mL IVPB     1 g 100 mL/hr over 30 Minutes Intravenous Every 8 hours 08/08/15 0941     08/05/15 1100  vancomycin (VANCOCIN) IVPB 750 mg/150 ml premix  Status:  Discontinued     750 mg 150 mL/hr over 60 Minutes Intravenous Every 12 hours 08/05/15 1015 08/07/15 1229   08/01/15 1000  fluconazole (DIFLUCAN) tablet 200 mg  Status:  Discontinued     200 mg Oral Daily 08/01/15 0839 08/01/15 1342   07/31/15 1500  vancomycin (VANCOCIN) IVPB 750 mg/150 ml premix  Status:  Discontinued     750 mg 150 mL/hr over 60 Minutes Intravenous Every 48 hours 07/31/15 1322 08/01/15 1342   07/27/15 1800  vancomycin (VANCOCIN) 500 mg in sodium chloride 0.9 % 100 mL IVPB     500 mg 100 mL/hr  over 60 Minutes Intravenous Every 12 hours 07/27/15 0048 07/29/15 1846   07/23/15 2300  vancomycin (VANCOCIN) IVPB 750 mg/150 ml premix  Status:  Discontinued     750 mg 150 mL/hr over 60 Minutes Intravenous Every 24 hours 07/23/15 2215 07/27/15 0048   07/18/15 0000  vancomycin (VANCOCIN) IVPB 750 mg/150 ml premix     750 mg 150 mL/hr over  60 Minutes Intravenous Every 24 hours 07/17/15 0508 07/19/15 0123   07/17/15 0100  vancomycin (VANCOCIN) 500 mg in sodium chloride 0.9 % 100 mL IVPB  Status:  Discontinued     500 mg 100 mL/hr over 60 Minutes Intravenous Every 24 hours 07/16/15 1016 07/17/15 0507   07/13/15 2000  imipenem-cilastatin (PRIMAXIN) 250 mg in sodium chloride 0.9 % 100 mL IVPB     250 mg 200 mL/hr over 30 Minutes Intravenous 3 times per day 07/13/15 1046 07/19/15 2256   07/11/15 2200  imipenem-cilastatin (PRIMAXIN) 250 mg in sodium chloride 0.9 % 100 mL IVPB  Status:  Discontinued     250 mg 200 mL/hr over 30 Minutes Intravenous Every 12 hours 07/11/15 1020 07/13/15 1046   07/10/15 2230  imipenem-cilastatin (PRIMAXIN) 250 mg in sodium chloride 0.9 % 100 mL IVPB  Status:  Discontinued     250 mg 200 mL/hr over 30 Minutes Intravenous 3 times per day 07/10/15 2226 07/11/15 1020   07/10/15 2230  vancomycin (VANCOCIN) 500 mg in sodium chloride 0.9 % 100 mL IVPB  Status:  Discontinued     500 mg 100 mL/hr over 60 Minutes Intravenous Every 24 hours 07/10/15 2226 07/16/15 1016        Subjective:   No complaints  Objective:   Blood pressure 125/83, pulse 77, temperature 99.6 F (37.6 C), temperature source Oral, resp. rate 16, height 5\' 7"  (1.702 m), weight 55.23 kg (121 lb 12.2 oz), SpO2 100 %.  Wt Readings from Last 3 Encounters:  08/08/15 55.23 kg (121 lb 12.2 oz)  07/05/15 44.906 kg (99 lb)  06/01/15 50.349 kg (111 lb)     Intake/Output Summary (Last 24 hours) at 08/08/15 1416 Last data filed at 08/08/15 0910  Gross per 24 hour  Intake    635 ml  Output   3050 ml   Net  -2415 ml    Exam  General: Alert and oriented x 3, NAD. Eating luch  CVS: S1 S2 auscultated, no rubs, murmurs or gallops. Regular rate and rhythm.  Respiratory: Decreased breath sounds at the bases  Abdomen: Soft, nontender, nondistended, + bowel sounds  Ext: no cyanosis clubbing or edema  Data Review   Micro Results Recent Results (from the past 240 hour(s))  Culture, Urine     Status: None (Preliminary result)   Collection Time: 07/29/15  6:06 PM  Result Value Ref Range Status   Specimen Description URINE, SUPRAPUBIC  Final   Special Requests Normal  Final   Culture   Final    >=100,000 COLONIES/mL PSEUDOMONAS AERUGINOSA AMIKACIN MIC=8 SENSITIVE FOSFOMYCIN MIC=>1024 RESISTANT AVYCAZ=22 SENSITIVE ZERBAXA=1 SENSITIVE    Report Status PENDING  Incomplete   Organism ID, Bacteria PSEUDOMONAS AERUGINOSA  Final      Susceptibility   Pseudomonas aeruginosa - MIC*    CEFTAZIDIME 16 INTERMEDIATE Intermediate     CIPROFLOXACIN >=4 RESISTANT Resistant     GENTAMICIN >=16 RESISTANT Resistant     IMIPENEM >=16 RESISTANT Resistant     CEFEPIME 16 INTERMEDIATE Intermediate     * >=100,000 COLONIES/mL PSEUDOMONAS AERUGINOSA  Anaerobic culture     Status: None (Preliminary result)   Collection Time: 08/04/15  7:08 PM  Result Value Ref Range Status   Specimen Description TISSUE LEFT SACRAL  Final   Special Requests NONE  Final   Gram Stain   Final    NO WBC SEEN NO ORGANISMS SEEN Performed at [facility]   Final  NO ANAEROBES ISOLATED; CULTURE IN PROGRESS FOR 5 DAYS Performed at [facility]    Report Status PENDING  Incomplete  Fungus Culture with Smear     Status: None (Preliminary result)   Collection Time: 08/04/15  7:11 PM  Result Value Ref Range Status   Specimen Description TISSUE LEFT SACRAL  Final   Special Requests NONE  Final   Fungal Smear   Final    NO YEAST OR FUNGAL ELEMENTS SEEN Performed at [facility]     Culture   Final    CULTURE IN PROGRESS FOR FOUR WEEKS Performed at [facility]    Report Status PENDING  Incomplete  AFB culture with smear     Status: None (Preliminary result)   Collection Time: 08/04/15  7:13 PM  Result Value Ref Range Status   Specimen Description TISSUE LEFT SACRAL  Final   Special Requests NONE  Final   Acid Fast Smear   Final    NO ACID FAST BACILLI SEEN Performed at [facility]    Culture   Final    CULTURE WILL BE EXAMINED FOR 6 WEEKS BEFORE ISSUING A FINAL REPORT Performed at [facility]    Report Status PENDING  Incomplete  Tissue culture     Status: None   Collection Time: 08/04/15  7:18 PM  Result Value Ref Range Status   Specimen Description TISSUE LEFT SACRAL  Final   Special Requests NONE  Final   Gram Stain   Final    NO WBC SEEN NO ORGANISMS SEEN Performed at [facility]    Culture   Final    MULTIPLE ORGANISMS PRESENT, NONE PREDOMINANT Note: NO GROUP A STREP (S.PYOGENES) ISOLATED NO STAPHYLOCOCCUS AUREUS ISOLATED Performed at [facility]    Report Status 08/07/2015 FINAL  Final    Radiology Reports Ct Abdomen Pelvis Wo Contrast  07/11/2015  CLINICAL DATA:  Sepsis. EXAM: CT ABDOMEN AND PELVIS WITHOUT CONTRAST TECHNIQUE: Multidetector CT imaging of the abdomen and pelvis was performed following the standard protocol without IV contrast. COMPARISON:  CT scan of pelvis of May 02, 2015; CT scan of abdomen pelvis December 11, 2014. FINDINGS: Bilateral posterior basilar opacity is are noted, right greater than left, concerning for possible pneumonia or subsegmental atelectasis. Gastrostomy tube is in grossly good position. IVC filter is noted in infrarenal position. No gallstones are noted. No focal abnormality is noted in the liver, spleen or pancreas on these unenhanced images. Adrenal glands appear normal. Mild left hydronephrosis is noted secondary to 3 mm calculus in the proximal left ureter. Multiple  small nonobstructive calculi are noted in right kidney. There is no evidence of bowel obstruction. Ostomy is noted in left lower quadrant. Foley catheter is noted within the urinary bladder. Wall thickening of the urinary bladder is noted which may simply represent lack of distension, but cystitis cannot be excluded. Mild anasarca is noted. Minimal free fluid is noted in the dependent portion of the pelvis. Large sacral decubitus ulcer is noted which is significantly increased in size. There has been surgical resection of the distal sacrum and coccyx. Another large sacral ulceration is seen extending to the right ischial tuberosity which is unchanged compared to prior exam. No lytic destruction is seen at this time to suggest acute osteomyelitis. IMPRESSION: Nonobstructive right nephrolithiasis. Mild left hydronephrosis is noted secondary to 3 mm calculus seen in the proximal left ureter. Bilateral posterior basilar lung opacities are noted, right greater than left, concerning  for pneumonia or possibly atelectasis. Mild anasarca. Foley catheter is present within the urinary bladder. Urinary bladder wall thickening is noted which may be due to lack of distension, but cystitis cannot be excluded. Stable decubitus ulcer seen in right-sided pelvis extending to ischial tuberosity. Large sacral decubitus ulcer is noted which is increased in size compared to prior exam. However, no lytic destruction is seen to suggest active osteomyelitis at this time. Electronically Signed   By: Marijo Conception, M.D.   On: 07/11/2015 14:16   Ct Head Wo Contrast  07/11/2015  CLINICAL DATA:  Acute encephalopathy, probable sepsis from UTI, paraplegic secondary to spinal cord injury, asthma, type I diabetes mellitus, stroke EXAM: CT HEAD WITHOUT CONTRAST TECHNIQUE: Contiguous axial images were obtained from the base of the skull through the vertex without intravenous contrast. COMPARISON:  01/06/2015 FINDINGS: Normal ventricular  morphology. No midline shift or mass effect. Normal appearance of brain parenchyma. No intracranial hemorrhage, mass lesion, or acute infarction. Visualized paranasal sinuses and mastoid air cells clear. Bones unremarkable. IMPRESSION: No acute intracranial abnormalities. Electronically Signed   By: Lavonia Dana M.D.   On: 07/11/2015 14:01   Ct Abdomen Pelvis W Contrast  07/25/2015  CLINICAL DATA:  GERD fever of unknown origin, recent MRSA infection, strep bacteremia, respiratory failure, admitted with hallucinations and confusion, question UTI ; history spinal cord injury and paraplegia, type I diabetes mellitus EXAM: CT ABDOMEN AND PELVIS WITH CONTRAST TECHNIQUE: Multidetector CT imaging of the abdomen and pelvis was performed using the standard protocol following bolus administration of intravenous contrast. Sagittal and coronal MPR images reconstructed from axial data set. CONTRAST:  182mL OMNIPAQUE IOHEXOL 300 MG/ML SOLN IV. Dilute oral contrast. COMPARISON:  07/11/2015 FINDINGS: Infiltrate RIGHT lower lobe. Mild atelectasis LEFT lower lobe. Peribronchial thickening in both lower lobes with RIGHT lower lobe bronchiectasis. Contracted gallbladder. Scarring at medial aspect of inferior pole RIGHT kidney unchanged. Liver, spleen, pancreas, kidneys, and adrenal glands otherwise unremarkable. IVC filter and gastrostomy tube. Normal appendix. Question bowel wall thickening involving the cecum and ascending colon versus artifact from underdistention. LEFT lower quadrant descending colostomy. Stomach and remaining bowel loops unremarkable. Bladder wall thickening, question related to chronic suprapubic catheterization in urinary tract infection versus chronic outlet obstruction. LEFT ureter distended though this could be related to bladder distention. Vascular structures grossly patent. Large sacral decubitus ulcer with prior distal sacrococcygeal destruction or resection. Foci of soft tissue gas within LEFT gluteus  maximus question penetration by decubitus ulcer and infection. Diffuse soft tissue edema. Additional foci of nonspecific soft tissue gas RIGHT lateral abdomen. No mass, adenopathy, free air or free fluid. Bones demineralized. IMPRESSION: Questionable wall thickening of the cecum and ascending colon versus artifact from underdistention, unable to exclude colitis. Diffuse bladder wall thickening which may be related to chronic catheterization/infection or outlet obstruction. Diffuse soft tissue edema which can be seen with anasarca and hypoproteinemia. Peribronchial thickening in both lower lobes with RIGHT lower lobe bronchiectasis and RIGHT lower lobe infiltrate. Sacral decubitus ulcer with prior distal sacrococcygeal destruction or resection and foci of gas within the LEFT gluteus maximus question infection. Additional nonspecific foci of soft tissue gas in the RIGHT lateral abdominal wall soft tissue planes. Electronically Signed   By: Lavonia Dana M.D.   On: 07/25/2015 17:46   Dg Chest Port 1 View  07/27/2015  CLINICAL DATA:  Tracheostomy and fever EXAM: PORTABLE CHEST 1 VIEW COMPARISON:  07/16/2015 FINDINGS: Tracheostomy tube in unchanged position over the tracheal air column. Heart size and  vascular pattern are normal. There are mild bilateral lower lobe infiltrates. When compared to the prior study there both significantly improved. No pleural effusion. Stable mild expansile deformity distal right clavicle possibly due to prior trauma. IMPRESSION: Persistent but improved bilateral lower lobe infiltrates. Electronically Signed   By: Skipper Cliche M.D.   On: 07/27/2015 08:09   Dg Chest Port 1 View  07/16/2015  CLINICAL DATA:  Pneumonia. EXAM: PORTABLE CHEST 1 VIEW COMPARISON:  July 13, 2015. FINDINGS: The heart size and mediastinal contours are within normal limits. Tracheostomy is unchanged in position. No pneumothorax is noted. Increased bibasilar opacities are noted concerning for edema or  pneumonia. Minimal right pleural effusion is noted. The visualized skeletal structures are unremarkable. IMPRESSION: Increased bibasilar opacities are noted concerning for worsening edema or pneumonia. Minimal right pleural effusion is noted. Electronically Signed   By: Marijo Conception, M.D.   On: 07/16/2015 08:29   Dg Chest Port 1 View  07/13/2015  CLINICAL DATA:  Follow-up of respiratory failure EXAM: PORTABLE CHEST 1 VIEW COMPARISON:  Portable chest x-ray of July 10, 2015 FINDINGS: The lungs are adequately inflated. The interstitial markings are slightly more conspicuous today especially in the infrahilar regions bilaterally greater on the left than on the right. There is no pleural effusion or pneumothorax. The heart and pulmonary vascularity are normal. The tracheostomy appliance tip projects just inferior to the inferior margins of the clavicular heads. IMPRESSION: Interval development of subsegmental atelectasis in the infrahilar regions bilaterally. There is no alveolar infiltrate nor evidence of pulmonary edema. Electronically Signed   By: David  Martinique M.D.   On: 07/13/2015 07:46   Dg Chest Port 1 View  07/10/2015  CLINICAL DATA:  Altered mental status. History of asthma, pneumonia, stroke, diabetes EXAM: PORTABLE CHEST 1 VIEW COMPARISON:  05/30/2015 FINDINGS: There is a tracheostomy tube with the tip 3.5 cm above the carina. There is no focal parenchymal opacity. There is no pleural effusion or pneumothorax. The heart and mediastinal contours are unremarkable. There is an old healed right clavicular fracture. IMPRESSION: No active disease. Electronically Signed   By: Kathreen Devoid   On: 07/10/2015 17:29   Dg Swallowing Func-speech Pathology  07/20/2015  Objective Swallowing Evaluation:   Patient Details Name: IZAYIAH TIBBITTS MRN: [id number] Date of Birth: 1986/08/20 Today's Date: 07/20/2015 Time: SLP Start Time (ACUTE ONLY): 1400-SLP Stop Time (ACUTE ONLY): 1420 SLP Time Calculation (min)  (ACUTE ONLY): 20 min Past Medical History: Past Medical History Diagnosis Date . GERD (gastroesophageal reflux disease)  . Asthma  . Hx MRSA infection    on face . Gastroparesis  . Diabetic neuropathy ([place])  . Seizures ([place])  . Family history of anesthesia complication    Pt mother can't have epidural procedures . Dysrhythmia  . Pneumonia  . Arthritis  . Fibromyalgia  . 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 Type I diabetes mellitus ([place])    sees Dr. Loanne Drilling  . Syncope 02/16/2015 Past Surgical History: Past Surgical History Procedure Laterality Date . Tonsillectomy   . Multiple extractions with alveoloplasty N/A 08/03/2014   Procedure: MULTIPLE EXTRACTIONS;  Surgeon: Gae Bon, DDS;  Location: [place];  Service: Oral Surgery;  Laterality: N/A; . Tee without cardioversion N/A 08/17/2014   Procedure: TRANSESOPHAGEAL ECHOCARDIOGRAM (TEE);  Surgeon: Dorothy Spark, MD;  Location: [facility] ENDOSCOPY;  Service: Cardiovascular;  Laterality: N/A; . Debridment of decubitus ulcer N/A 10/04/2014   Procedure: DEBRIDMENT OF DECUBITUS ULCER;  Surgeon: Georganna Skeans,  MD;  Location: [place];  Service: General;  Laterality: N/A; . Laparoscopic diverted colostomy N/A 10/12/2014   Procedure: LAPAROSCOPIC DIVERTING COLOSTOMY;  Surgeon: Donnie Mesa, MD;  Location: [place];  Service: General;  Laterality: N/A; . Insertion of suprapubic catheter N/A 10/12/2014   Procedure: INSERTION OF SUPRAPUBIC CATHETER;  Surgeon: Reece Packer, MD;  Location: [place];  Service: Urology;  Laterality: N/A; . Incision and drainage of wound N/A 11/12/2014   Procedure: IRRIGATION AND DEBRIDEMENT OF WOUNDS WITH BONE BIOPSY AND SURGICAL PREP ;  Surgeon: Theodoro Kos, DO;  Location: [place];  Service: Plastics;  Laterality: N/A; . Application of a-cell of back N/A 11/12/2014   Procedure: APPLICATION A CELL AND VAC ;  Surgeon: Theodoro Kos, DO;  Location: [place];  Service: Plastics;  Laterality: N/A; . Incision and drainage of wound N/A  11/18/2014   Procedure: IRRIGATION AND DEBRIDEMENT OF SACRAL ULCER ONLY WITH PLACEMENT OF A CELL AND VAC/ DRESSING CHANGE TO UPPER BACK AREA.;  Surgeon: Theodoro Kos, DO;  Location: [place];  Service: Plastics;  Laterality: N/A; . Incision and drainage of wound N/A 11/25/2014   Procedure: IRRIGATION AND DEBRIDEMENT OF SACRAL ULCER AND BACK BURN WITH PLACEMENT OF A-CELL;  Surgeon: Theodoro Kos, DO;  Location: [place];  Service: Plastics;  Laterality: N/A; . Minor application of wound vac N/A 11/25/2014   Procedure:  WOUND VAC CHANGE;  Surgeon: Theodoro Kos, DO;  Location: [place];  Service: Plastics;  Laterality: N/A; . Incision and drainage of wound Right 12/08/2014   Procedure: IRRIGATION AND DEBRIDEMENT SACRAL WOUND AND RIGHT ISCHIAL WOUND ;  Surgeon: Theodoro Kos, DO;  Location: [place];  Service: Plastics;  Laterality: Right; . Application of a-cell of back N/A 12/08/2014   Procedure: APPLICATION OF A-CELL AND WOUND VAC ;  Surgeon: Theodoro Kos, DO;  Location: [place];  Service: Plastics;  Laterality: N/A; . Tee without cardioversion N/A 05/05/2015   Procedure: TRANSESOPHAGEAL ECHOCARDIOGRAM (TEE);  Surgeon: Lelon Perla, MD;  Location: [place];  Service: Cardiovascular;  Laterality: N/A; . Incision and drainage of wound Bilateral 05/05/2015   Procedure: IRRIGATION AND DEBRIDEMENT SACRAL ULCER;  Surgeon: Theodoro Kos, DO;  Location: [place];  Service: Plastics;  Laterality: Bilateral; . Hematoma evacuation N/A 05/05/2015   Procedure: EVACUATION HEMATOMA bedside procedure;  Surgeon: Theodoro Kos, DO;  Location: [place];  Service: Plastics;  Laterality: N/A; HPI: Other Pertinent Information: 29 y.o. male with history of spinal cord injury status post paraplegia who was recently admitted for MRSA and microaerophilic strep bacteremia with respiratory failure requiring trach; nocturnal vent. Admitted due to AMS. Found to have UTI, hypothermia, hypotension, toxic metabolic encephalopathy. In August 2016 pt seen for in-line  Passy-Muir Speaking valve, trach'd and continued with PMSV. MBS 05/2015 recommended NPO, received PEG. MBS 10/7 with recs for dysphagia 2, honey thick secondary to silent, deep penetration nectars; mild oral phase deficits.  Pt has been tolerating current diet with valve in place for all POs.  No Data Recorded Assessment / Plan / Recommendation CHL IP CLINICAL IMPRESSIONS 07/20/2015 Therapy Diagnosis WFL Clinical Impression Repeat MBS completed with PMV in place.  Pt with much improved swallow function marked by normal mastication, brisk swallow response, excellent pharyngeal clearance of all POs, and only transient, high penetration of thin liquids.  Despite successive, large liquid boluses consumed simultaneously with solids, pt protected his airway reliably.  Recommend initiating  a regular diet, thin liquids.  PMV must be in place for all PO consumption.  CHL IP TREATMENT RECOMMENDATION 07/20/2015 Treatment Recommendations Therapy as outlined in treatment plan below   CHL IP DIET RECOMMENDATION 07/20/2015 SLP Diet Recommendations Age appropriate regular solids;Thin Liquid Administration via (None) Medication Administration Whole meds with puree Compensations (None) Postural Changes and/or Swallow Maneuvers (None)   CHL IP OTHER RECOMMENDATIONS 07/20/2015 Recommended Consults (None) Oral Care Recommendations Oral care BID Other Recommendations (None)   CHL IP FOLLOW UP RECOMMENDATIONS 05/27/2015 Follow up Recommendations 24 hour supervision/assistance   CHL IP FREQUENCY AND DURATION 07/20/2015 Speech Therapy Frequency (ACUTE ONLY) min 2x/week Treatment Duration 2 weeks   SLP Swallow Goals No flowsheet data found. No flowsheet data found.   CHL IP REASON FOR REFERRAL 07/20/2015 Reason for Referral Objectively evaluate swallowing function   CHL IP ORAL PHASE 07/20/2015 Lips (None) Tongue (None) Mucous membranes (None) Nutritional status (None) Other (None) Oxygen therapy (None) Oral Phase WFL Oral - Pudding  Teaspoon (None) Oral - Pudding Cup (None) Oral - Honey Teaspoon (None) Oral - Honey Cup (None) Oral - Honey Syringe (None) Oral - Nectar Teaspoon (None) Oral - Nectar Cup (None) Oral - Nectar Straw (None) Oral - Nectar Syringe (None) Oral - Ice Chips (None) Oral - Thin Teaspoon (None) Oral - Thin Cup (None) Oral - Thin Straw (None) Oral - Thin Syringe (None) Oral - Puree (None) Oral - Mechanical Soft (None) Oral - Regular (None) Oral - Multi-consistency (None) Oral - Pill (None) Oral Phase - Comment (None)   CHL IP PHARYNGEAL PHASE 07/20/2015 Pharyngeal Phase WFL Pharyngeal - Pudding Teaspoon (None) Penetration/Aspiration details (pudding teaspoon) (None) Pharyngeal - Pudding Cup (None) Penetration/Aspiration details (pudding cup) (None) Pharyngeal - Honey Teaspoon (None) Penetration/Aspiration details (honey teaspoon) (None) Pharyngeal - Honey Cup (None) Penetration/Aspiration details (honey cup) (None) Pharyngeal - Honey Syringe (None) Penetration/Aspiration details (honey syringe) (None) Pharyngeal - Nectar Teaspoon (None) Penetration/Aspiration details (nectar teaspoon) (None) Pharyngeal - Nectar Cup (None) Penetration/Aspiration details (nectar cup) (None) Pharyngeal - Nectar Straw (None) Penetration/Aspiration details (nectar straw) (None) Pharyngeal - Nectar Syringe (None) Penetration/Aspiration details (nectar syringe) (None) Pharyngeal - Ice Chips (None) Penetration/Aspiration details (ice chips) (None) Pharyngeal - Thin Teaspoon (None) Penetration/Aspiration details (thin teaspoon) (None) Pharyngeal - Thin Cup (None) Penetration/Aspiration details (thin cup) (None) Pharyngeal - Thin Straw (None) Penetration/Aspiration details (thin straw) (None) Pharyngeal - Thin Syringe (None) Penetration/Aspiration details (thin syringe') (None) Pharyngeal - Puree (None) Penetration/Aspiration details (puree) (None) Pharyngeal - Mechanical Soft (None) Penetration/Aspiration details (mechanical soft) (None) Pharyngeal -  Regular (None) Penetration/Aspiration details (regular) (None) Pharyngeal - Multi-consistency (None) Penetration/Aspiration details (multi-consistency) (None) Pharyngeal - Pill (None) Penetration/Aspiration details (pill) (None) Pharyngeal Comment (None)   CHL IP CERVICAL ESOPHAGEAL PHASE 07/15/2015 Cervical Esophageal Phase WFL Pudding Teaspoon (None) Pudding Cup (None) Honey Teaspoon (None) Honey Cup (None) Honey Straw (None) Nectar Teaspoon (None) Nectar Cup (None) Nectar Straw (None) Nectar Sippy Cup (None) Thin Teaspoon (None) Thin Cup (None) Thin Straw (None) Thin Sippy Cup (None) Cervical Esophageal Comment (None) No flowsheet data found.   Juan Quam Laurice 07/20/2015, 2:37 PM   Dg Swallowing Func-speech Pathology  07/15/2015  Objective Swallowing Evaluation:   Modified Barium Swallow Patient Details Name: HENDRIXX SEVERIN MRN: [id number] Date of Birth: 05-20-1986 Today's Date: 07/15/2015 Time: SLP Start Time (ACUTE ONLY): 1347-SLP Stop Time (ACUTE ONLY): 1405 SLP Time Calculation (min) (ACUTE ONLY): 18 min Past Medical History: Past Medical History Diagnosis Date . GERD (gastroesophageal reflux disease)  . Asthma  . Hx MRSA infection    on face . Gastroparesis  . Diabetic neuropathy ([place])  .  Seizures ([place])  . Family history of anesthesia complication    Pt mother can't have epidural procedures . Dysrhythmia  . Pneumonia  . Arthritis  . Fibromyalgia  . Stroke ([place]) 01/29/2014   spinal stroke ; "quadriplegic since" . Type I diabetes mellitus ([place])    sees Dr. Loanne Drilling  . Syncope 02/16/2015 Past Surgical History: Past Surgical History Procedure Laterality Date . Tonsillectomy   . Multiple extractions with alveoloplasty N/A 08/03/2014   Procedure: MULTIPLE EXTRACTIONS;  Surgeon: Gae Bon, DDS;  Location: [place];  Service: Oral Surgery;  Laterality: N/A; . Tee without cardioversion N/A 08/17/2014   Procedure: TRANSESOPHAGEAL ECHOCARDIOGRAM (TEE);  Surgeon: Dorothy Spark, MD;  Location: [facility] ENDOSCOPY;   Service: Cardiovascular;  Laterality: N/A; . Debridment of decubitus ulcer N/A 10/04/2014   Procedure: DEBRIDMENT OF DECUBITUS ULCER;  Surgeon: Georganna Skeans, MD;  Location: [place];  Service: General;  Laterality: N/A; . Laparoscopic diverted colostomy N/A 10/12/2014   Procedure: LAPAROSCOPIC DIVERTING COLOSTOMY;  Surgeon: Donnie Mesa, MD;  Location: [place];  Service: General;  Laterality: N/A; . Insertion of suprapubic catheter N/A 10/12/2014   Procedure: INSERTION OF SUPRAPUBIC CATHETER;  Surgeon: Reece Packer, MD;  Location: [place];  Service: Urology;  Laterality: N/A; . Incision and drainage of wound N/A 11/12/2014   Procedure: IRRIGATION AND DEBRIDEMENT OF WOUNDS WITH BONE BIOPSY AND SURGICAL PREP ;  Surgeon: Theodoro Kos, DO;  Location: [place];  Service: Plastics;  Laterality: N/A; . Application of a-cell of back N/A 11/12/2014   Procedure: APPLICATION A CELL AND VAC ;  Surgeon: Theodoro Kos, DO;  Location: [place];  Service: Plastics;  Laterality: N/A; . Incision and drainage of wound N/A 11/18/2014   Procedure: IRRIGATION AND DEBRIDEMENT OF SACRAL ULCER ONLY WITH PLACEMENT OF A CELL AND VAC/ DRESSING CHANGE TO UPPER BACK AREA.;  Surgeon: Theodoro Kos, DO;  Location: [place];  Service: Plastics;  Laterality: N/A; . Incision and drainage of wound N/A 11/25/2014   Procedure: IRRIGATION AND DEBRIDEMENT OF SACRAL ULCER AND BACK BURN WITH PLACEMENT OF A-CELL;  Surgeon: Theodoro Kos, DO;  Location: [place];  Service: Plastics;  Laterality: N/A; . Minor application of wound vac N/A 11/25/2014   Procedure:  WOUND VAC CHANGE;  Surgeon: Theodoro Kos, DO;  Location: [place];  Service: Plastics;  Laterality: N/A; . Incision and drainage of wound Right 12/08/2014   Procedure: IRRIGATION AND DEBRIDEMENT SACRAL WOUND AND RIGHT ISCHIAL WOUND ;  Surgeon: Theodoro Kos, DO;  Location: [place];  Service: Plastics;  Laterality: Right; . Application of a-cell of back N/A 12/08/2014   Procedure: APPLICATION OF A-CELL AND WOUND VAC ;  Surgeon:  Theodoro Kos, DO;  Location: [place];  Service: Plastics;  Laterality: N/A; . Tee without cardioversion N/A 05/05/2015   Procedure: TRANSESOPHAGEAL ECHOCARDIOGRAM (TEE);  Surgeon: Lelon Perla, MD;  Location: [place];  Service: Cardiovascular;  Laterality: N/A; . Incision and drainage of wound Bilateral 05/05/2015   Procedure: IRRIGATION AND DEBRIDEMENT SACRAL ULCER;  Surgeon: Theodoro Kos, DO;  Location: [place];  Service: Plastics;  Laterality: Bilateral; . Hematoma evacuation N/A 05/05/2015   Procedure: EVACUATION HEMATOMA bedside procedure;  Surgeon: Theodoro Kos, DO;  Location: [place];  Service: Plastics;  Laterality: N/A; HPI: Other Pertinent Information: 29 y.o. male with history of spinal cord injury status post paraplegia who was recently admitted for MRSA and microaerophilic strep bacteremia with respiratory failure requiring trach; nocturnal vent. Admitted due to AMS. Found to have UTI, hypothermia, hypotension, toxic metabolic  encephalopathy. In August 2016 pt seen for in-line Passy-Muir Speaking valve, trach'd and continued with PMSV. MBS 05/2015 recommended NPO, received PEG. Today repeat MBS for possible initiation of food/liquid. He had Speaking valve from prior admission and currently in place.  No Data Recorded Assessment / Plan / Recommendation CHL IP CLINICAL IMPRESSIONS 07/15/2015 Therapy Diagnosis Mild oral phase dysphagia;Mild pharyngeal phase dysphagia;Moderate pharyngeal phase dysphagia Clinical Impression MBS performed with PMSV in place. Mild prolonged mastication and oral transit with solid texture. Pharyngeal phase characterized by motor impairments with silent laryngeal penetration with nectar with chin tuck posture increasing amount and depth of penetration. Mild-mod vallecular and pyriform sinus residue reduced with verbal cues for second swallow. Honey thick liquids recommended with second swallow (aspiration risk to high during the swallow with nectar), Dys 2 diet, full assist for  feeding and precautions, swallow twice following bites/sips, wear Passy-Muir speaking valve with all meals/meds and upright posture, continued ST.     CHL IP TREATMENT RECOMMENDATION 07/15/2015 Treatment Recommendations Therapy as outlined in treatment plan below   CHL IP DIET RECOMMENDATION 07/15/2015 SLP Diet Recommendations Dysphagia 2 (Fine chop) Liquid Administration via (None) Medication Administration Whole meds with puree Compensations Slow rate;Small sips/bites;Multiple dry swallows after each bite/sip Postural Changes and/or Swallow Maneuvers (None)   CHL IP OTHER RECOMMENDATIONS 07/15/2015 Recommended Consults (None) Oral Care Recommendations Oral care BID Other Recommendations (None)   CHL IP FOLLOW UP RECOMMENDATIONS 05/27/2015 Follow up Recommendations 24 hour supervision/assistance   CHL IP FREQUENCY AND DURATION 07/15/2015 Speech Therapy Frequency (ACUTE ONLY) min 2x/week Treatment Duration 2 weeks   Pertinent Vitals/Pain none  SLP Swallow Goals No flowsheet data found. No flowsheet data found.   CHL IP REASON FOR REFERRAL 07/15/2015 Reason for Referral Objectively evaluate swallowing function       No flowsheet data found.   [name] 07/15/2015, 2:52 PM Orbie Pyo Colvin Caroli.Ed CCC-SLP Pager [phone]    CBC  Recent Labs Lab 08/02/15 0015 08/04/15 0419 08/05/15 2200 08/07/15 0650 08/08/15 0510  WBC 16.5* 15.6* 21.8* 20.5* 21.8*  HGB 8.3* 8.0* 8.6* 8.5* 8.1*  HCT 27.2* 26.1* 27.1* 26.6* 25.3*  PLT 914* 844* 894* 888* 806*  MCV 86.1 85.6 85.0 85.0 86.1  MCH 26.3 26.2 27.0 27.2 27.6  MCHC 30.5 30.7 31.7 32.0 32.0  RDW 15.4 15.3 15.1 15.2 15.0    Chemistries   Recent Labs Lab 08/02/15 0015 08/04/15 0419 08/05/15 0920 08/05/15 2200 08/07/15 0650 08/07/15 1045 08/08/15 0510  NA 131* 129*  --  130* 134*  --  134*  K 4.8 4.5  --  4.7 6.2* 4.5 5.1  CL 100* 95*  --  96* 101  --  98*  CO2 23 26  --  25 21*  --  26  GLUCOSE 217* 123* 508* 165* 64*  --  280*  BUN 11 12   --  10 10  --  10  CREATININE 0.66 0.58*  --  0.61 0.58*  --  0.61  CALCIUM 8.6* 8.5*  --  9.0 9.2  --  8.7*  MG  --   --   --   --  1.5*  --   --    ------------------------------------------------------------------------------------------------------------------ estimated creatinine clearance is 104.5 mL/min (by C-G formula based on Cr of 0.61). ------------------------------------------------------------------------------------------------------------------ No results for input(s): HGBA1C in the last 72 hours. ------------------------------------------------------------------------------------------------------------------ No results for input(s): CHOL, HDL, LDLCALC, TRIG, CHOLHDL, LDLDIRECT in the last 72 hours. ------------------------------------------------------------------------------------------------------------------ No results for input(s): TSH, T4TOTAL, T3FREE, THYROIDAB in  the last 72 hours.  Invalid input(s): FREET3 ------------------------------------------------------------------------------------------------------------------ No results for input(s): VITAMINB12, FOLATE, FERRITIN, TIBC, IRON, RETICCTPCT in the last 72 hours.  Coagulation profile No results for input(s): INR, PROTIME in the last 168 hours.  No results for input(s): DDIMER in the last 72 hours.  Cardiac Enzymes No results for input(s): CKMB, TROPONINI, MYOGLOBIN in the last 168 hours.  Invalid input(s): CK ------------------------------------------------------------------------------------------------------------------ Invalid input(s): [place]  08/07/15 1159 08/07/15 1721 08/07/15 2200 08/08/15 0303 08/08/15 0728 08/08/15 1253  GLUCAP 185* 177* 162* 238* 300* 211*     Phylisha Dix L M.D.  [facility] 08/08/2015, 2:16 PM  [email or web address] - password [facility]

## 2015-08-09 DIAGNOSIS — Z960 Presence of urogenital implants: Secondary | ICD-10-CM

## 2015-08-09 DIAGNOSIS — D72829 Elevated white blood cell count, unspecified: Secondary | ICD-10-CM

## 2015-08-09 DIAGNOSIS — E46 Unspecified protein-calorie malnutrition: Secondary | ICD-10-CM

## 2015-08-09 DIAGNOSIS — B9689 Other specified bacterial agents as the cause of diseases classified elsewhere: Secondary | ICD-10-CM

## 2015-08-09 DIAGNOSIS — N390 Urinary tract infection, site not specified: Secondary | ICD-10-CM

## 2015-08-09 DIAGNOSIS — L89159 Pressure ulcer of sacral region, unspecified stage: Secondary | ICD-10-CM

## 2015-08-09 LAB — GLUCOSE, CAPILLARY
GLUCOSE-CAPILLARY: 373 mg/dL — AB (ref 65–99)
GLUCOSE-CAPILLARY: 160 mg/dL — AB (ref 65–99)
GLUCOSE-CAPILLARY: 66 mg/dL (ref 65–99)
Glucose-Capillary: 254 mg/dL — ABNORMAL HIGH (ref 65–99)
Glucose-Capillary: 384 mg/dL — ABNORMAL HIGH (ref 65–99)

## 2015-08-09 LAB — CBC
HEMATOCRIT: 25.4 % — AB (ref 39.0–52.0)
HEMOGLOBIN: 7.7 g/dL — AB (ref 13.0–17.0)
MCH: 26 pg (ref 26.0–34.0)
MCHC: 30.3 g/dL (ref 30.0–36.0)
MCV: 85.8 fL (ref 78.0–100.0)
Platelets: 756 10*3/uL — ABNORMAL HIGH (ref 150–400)
RBC: 2.96 MIL/uL — ABNORMAL LOW (ref 4.22–5.81)
RDW: 14.9 % (ref 11.5–15.5)
WBC: 23.4 10*3/uL — ABNORMAL HIGH (ref 4.0–10.5)

## 2015-08-09 LAB — ANAEROBIC CULTURE: GRAM STAIN: NONE SEEN

## 2015-08-09 LAB — BASIC METABOLIC PANEL
Anion gap: 7 (ref 5–15)
BUN: 10 mg/dL (ref 6–20)
CHLORIDE: 97 mmol/L — AB (ref 101–111)
CO2: 25 mmol/L (ref 22–32)
CREATININE: 0.6 mg/dL — AB (ref 0.61–1.24)
Calcium: 8.6 mg/dL — ABNORMAL LOW (ref 8.9–10.3)
GFR calc non Af Amer: >60 mL/min (ref >60)
Glucose, Bld: 383 mg/dL — ABNORMAL HIGH (ref 65–99)
POTASSIUM: 5.2 mmol/L — AB (ref 3.5–5.1)
Sodium: 129 mmol/L — ABNORMAL LOW (ref 135–145)

## 2015-08-09 MED ORDER — INSULIN GLARGINE 100 UNIT/ML ~~LOC~~ SOLN
18.0000 [IU] | Freq: Every day | SUBCUTANEOUS | Status: DC
  Administered 2015-08-10 – 2015-08-16 (×7): 18 [IU] via SUBCUTANEOUS
  Filled 2015-08-09 (×7): qty 0.18

## 2015-08-09 MED ORDER — DEXTROSE 5 % IV SOLN
7.5000 mg/kg | INTRAVENOUS | Status: DC
  Administered 2015-08-09: 425 mg via INTRAVENOUS
  Filled 2015-08-09: qty 1.7

## 2015-08-09 MED ORDER — INSULIN ASPART 100 UNIT/ML ~~LOC~~ SOLN
5.0000 [IU] | Freq: Three times a day (TID) | SUBCUTANEOUS | Status: DC
  Administered 2015-08-10 – 2015-08-11 (×3): 5 [IU] via SUBCUTANEOUS

## 2015-08-09 NOTE — Progress Notes (Signed)
Inpatient Diabetes Program Recommendations  AACE/ADA: New Consensus Statement on Inpatient Glycemic Control (2015)  Target Ranges:  Prepandial:   less than 140 mg/dL      Peak postprandial:   less than 180 mg/dL (1-2 hours)      Critically ill patients:  140 - 180 mg/dL  Results for Jason [name], Jason [name] (MRN [id number]) as of 08/09/2015 12:19  Ref. Range 08/08/2015 07:28 08/08/2015 12:53 08/08/2015 17:20 08/08/2015 22:00 08/09/2015 03:00 08/09/2015 07:36  Glucose-Capillary Latest Ref Range: 65-99 mg/dL 300 (H) 211 (H) 190 (H) 225 (H) 254 (H) 384 (H)   Review of Glycemic Control Current orders for Inpatient glycemic control: Lantus 15 units daily, Novolog 0-15 units TID with meals, Novolog 0-5 units HS, Novolog 3 units TID with meals  Inpatient Diabetes Program Recommendations: Correction (SSI): Glucose ranged from 190-384 mg/dl over the past 24 hours. Anticipate Vital tube feedings @ 60 cc/hr running from 6p to 6a is contributing to hyperglycemia. Please consider changing frequency of CBGs and Novolog correction to Q4H so that patient can receive correction during the night if needed. Insulin - Meal Coverage: Please consider increasing meal coverage to Novolog 5 units TID with meals if patient is eating at least 50% of meals. Diet: If appropriate, please consider changing diet from Regular to Carb Modified.  Thanks, Barnie Alderman, RN, MSN, CDE Diabetes Coordinator Inpatient Diabetes Program [phone] (Team Pager from [place] to [place]) [phone] ([place] office) [phone] [facility] office) [phone] [facility] office)

## 2015-08-09 NOTE — Progress Notes (Signed)
[facility]                                                                              Patient Demographics  Jason [name], is a 29 y.o. male, DOB - 1986/01/12, ZCH:[id number]  Admit date - 07/10/2015   Admitting Physician [name], MD  Outpatient Primary MD for the patient is [name], MD  LOS - 36   Chief Complaint  Patient presents with  . Altered Mental Status       Brief HPI   29 y.o. 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 Patient was recently tapered off his IV antibiotics and was placed on doxycycline and Cipro 3 days prior to this admit for possible UTI.   In the ER patient UA shows features concerning for UTI - patient has chronic suprapubic catheter. Patient was also found to be hypothermic and hypotensive. Patient has had previous history of multiple debridement for chronic sacral decubitus which at this time looks to be healing. Patient also has history of chronic osteomyelitis. S/p bone biopsy by plastics  Assessment & Plan   Principal problem Toxic metabolic encephalopathy Resolved - patient is presently alert and oriented  Acute problem Sepsis due to MDR Pseudomonas Aeruginosa UTI ? Vs decubitus/osteomyelitis - suprapubic catheter changed by urology - ID and plastics following: CT Scan showed: Sacral decubitus ulcer with prior distal sacrococcygeal destruction or resection and foci of gas within the LEFT gluteus maximus question infection. - Dr. Migdalia Dk.s/p biopsy on 10/27- cultures with multiple species Discussed with Dr. Linus Salmons. He recommends treating Pseudomonas in urine with amikacin and monitoring fever curve and leukocytosis. Was found to be resistant to  fosfomycin of which he received 2 courses. He doubts osteomyelitis is the cause of current leukocytosis and fevers. Suprapubic catheter changed twice this hospitalization.  Acute renal failure Due to sepsis -resolved  Hyponatremia  Mild, corrects toward normal considering hyperglycemia  Hypomagnesemia: recheck  Hyperglkalemia: mild. Recheck in am  Diabetes mellitus type 1 with hyperglycemia Blood glucoses increasing. Will change diet to carb mod (was on reg diet per mother's request). On night time TF. Increase lantus and meal coverage  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 S/p bone biopsy, acell  Chronic anemia H&H monitor  Chronic respiratory failure with trach and vent pulmonary critical care supposed to be managing vent - it appears the patient took himself off the vent 10/7 and has not been back on since - currently appears stable on 28% trach collar  - As per PCCM ELink note 10/3, it appears that patient is followed in OP trach clinic> will defer further trach decisions to OP follow up in that clinic. - Dr. Eliseo Squires discussed with CCM M.D. on call 10/16: Agrees with outpatient follow-up with trach clinic and indicates that he may not be decannulated.   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 IVC filter placed 8/6  History of spinal cord injury  with paraplegia  Severe malnutrition in context of chronic illness On night time TF  DVT Prophylaxis: Lovenox   Code Status: Full CODE STATUS  Family Communication: mother   Disposition Plan: Medicaid has no LTAC coverage  Time Spent in minutes  25 minutes  Procedures  None  Consults    PCCM   Infectious disease  Plastics- [place]   Medications  Scheduled Meds: . aztreonam  1 g Intravenous Q8H  . baclofen  10 mg Oral 2 times per day  . baclofen  20 mg Oral QHS  . collagenase   Topical Daily  . enoxaparin (LOVENOX) injection  40 mg Subcutaneous Daily  . fentaNYL  50 mcg Transdermal Q72H  . Influenza vac split  quadrivalent PF  0.5 mL Intramuscular Tomorrow-1000  . insulin aspart  0-15 Units Subcutaneous TID WC  . insulin aspart  0-5 Units Subcutaneous QHS  . insulin aspart  3 Units Subcutaneous TID WC  . insulin glargine  15 Units Subcutaneous Daily  . metroNIDAZOLE  500 mg Oral 3 times per day  . midodrine  30 mg Oral TID WC  . multivitamin with minerals  1 tablet Oral Daily  . pantoprazole sodium  40 mg Oral Daily  . pregabalin  100 mg Oral 3 times per day  . pregabalin  200 mg Oral QHS  . sodium chloride  10-40 mL Intracatheter Q12H  . vancomycin  500 mg Intravenous Q24H   Continuous Infusions: . sodium chloride 10 mL/hr at 08/05/15 1038  . feeding supplement (VITAL 1.0 CAL) 1,000 mL (08/08/15 1835)  . lactated ringers 10 mL/hr at 08/04/15 1721   PRN Meds:.acetaminophen (TYLENOL) oral liquid 160 mg/5 mL, albuterol, diphenhydrAMINE-zinc acetate, LORazepam, mirtazapine, ondansetron **OR** ondansetron (ZOFRAN) IV, oxyCODONE, RESOURCE THICKENUP CLEAR, sodium chloride   Antibiotics   Anti-infectives    Start     Dose/Rate Route Frequency Ordered Stop   08/09/15 1100  vancomycin (VANCOCIN) IVPB 750 mg/150 ml premix  Status:  Discontinued     750 mg 150 mL/hr over 60 Minutes Intravenous Every 48 hours 08/07/15 1455 08/07/15 1458   08/08/15 1600  vancomycin (VANCOCIN) 500 mg in sodium chloride 0.9 % 100 mL IVPB     500 mg 100 mL/hr over 60 Minutes Intravenous Every 24 hours 08/08/15 0831     08/08/15 1000  metroNIDAZOLE (FLAGYL) tablet 500 mg     500 mg Oral 3 times per day 08/08/15 0938     08/08/15 1000  aztreonam (AZACTAM) 1 g in dextrose 5 % 50 mL IVPB     1 g 100 mL/hr over 30 Minutes Intravenous Every 8 hours 08/08/15 0941     08/05/15 1100  vancomycin (VANCOCIN) IVPB 750 mg/150 ml premix  Status:  Discontinued     750 mg 150 mL/hr over 60 Minutes Intravenous Every 12 hours 08/05/15 1015 08/07/15 1229   08/01/15 1000  fluconazole (DIFLUCAN) tablet 200 mg  Status:  Discontinued      200 mg Oral Daily 08/01/15 0839 08/01/15 1342   07/31/15 1500  vancomycin (VANCOCIN) IVPB 750 mg/150 ml premix  Status:  Discontinued     750 mg 150 mL/hr over 60 Minutes Intravenous Every 48 hours 07/31/15 1322 08/01/15 1342   07/27/15 1800  vancomycin (VANCOCIN) 500 mg in sodium chloride 0.9 % 100 mL IVPB     500 mg 100 mL/hr over 60 Minutes Intravenous Every 12 hours 07/27/15 0048 07/29/15 1846   07/23/15 2300  vancomycin (VANCOCIN) IVPB 750 mg/150 ml premix  Status:  Discontinued     750 mg 150 mL/hr over 60 Minutes Intravenous Every 24 hours 07/23/15 2215 07/27/15 0048   07/18/15 0000  vancomycin (VANCOCIN) IVPB 750 mg/150 ml premix     750 mg 150 mL/hr over 60 Minutes Intravenous Every 24 hours 07/17/15 0508 07/19/15 0123   07/17/15 0100  vancomycin (VANCOCIN) 500 mg in sodium chloride 0.9 % 100 mL IVPB  Status:  Discontinued     500 mg 100 mL/hr over 60 Minutes Intravenous Every 24 hours 07/16/15 1016 07/17/15 0507   07/13/15 2000  imipenem-cilastatin (PRIMAXIN) 250 mg in sodium chloride 0.9 % 100 mL IVPB     250 mg 200 mL/hr over 30 Minutes Intravenous 3 times per day 07/13/15 1046 07/19/15 2256   07/11/15 2200  imipenem-cilastatin (PRIMAXIN) 250 mg in sodium chloride 0.9 % 100 mL IVPB  Status:  Discontinued     250 mg 200 mL/hr over 30 Minutes Intravenous Every 12 hours 07/11/15 1020 07/13/15 1046   07/10/15 2230  imipenem-cilastatin (PRIMAXIN) 250 mg in sodium chloride 0.9 % 100 mL IVPB  Status:  Discontinued     250 mg 200 mL/hr over 30 Minutes Intravenous 3 times per day 07/10/15 2226 07/11/15 1020   07/10/15 2230  vancomycin (VANCOCIN) 500 mg in sodium chloride 0.9 % 100 mL IVPB  Status:  Discontinued     500 mg 100 mL/hr over 60 Minutes Intravenous Every 24 hours 07/10/15 2226 07/16/15 1016        Subjective:   No complaints  Objective:   Blood pressure 100/62, pulse 97, temperature 99.9 F (37.7 C), temperature source Oral, resp. rate 18, height 5\' 7"  (1.702 m),  weight 55.7 kg (122 lb 12.7 oz), SpO2 100 %.  Wt Readings from Last 3 Encounters:  08/09/15 55.7 kg (122 lb 12.7 oz)  07/05/15 44.906 kg (99 lb)  06/01/15 50.349 kg (111 lb)     Intake/Output Summary (Last 24 hours) at 08/09/15 1302 Last data filed at 08/09/15 0700  Gross per 24 hour  Intake    800 ml  Output   4300 ml  Net  -3500 ml    Exam  General: Alert and oriented x 3, NAD. Eating luch  CVS: S1 S2 auscultated, no rubs, murmurs or gallops. Regular rate and rhythm.  Respiratory: Decreased breath sounds at the bases  Abdomen: Soft, nontender, nondistended, + bowel sounds  Ext: no cyanosis clubbing or edema  Data Review   Micro Results Recent Results (from the past 240 hour(s))  Anaerobic culture     Status: None   Collection Time: 08/04/15  7:08 PM  Result Value Ref Range Status   Specimen Description TISSUE LEFT SACRAL  Final   Special Requests NONE  Final   Gram Stain   Final    NO WBC SEEN NO ORGANISMS SEEN Performed at [facility]    Culture   Final    NO ANAEROBES ISOLATED Performed at [facility]    Report Status 08/09/2015 FINAL  Final  Fungus Culture with Smear     Status: None (Preliminary result)   Collection Time: 08/04/15  7:11 PM  Result Value Ref Range Status   Specimen Description TISSUE LEFT SACRAL  Final   Special Requests NONE  Final   Fungal Smear   Final    NO YEAST OR FUNGAL ELEMENTS SEEN Performed at [facility]    Culture   Final    CULTURE IN PROGRESS FOR FOUR WEEKS Performed  at [facility]    Report Status PENDING  Incomplete  AFB culture with smear     Status: None (Preliminary result)   Collection Time: 08/04/15  7:13 PM  Result Value Ref Range Status   Specimen Description TISSUE LEFT SACRAL  Final   Special Requests NONE  Final   Acid Fast Smear   Final    NO ACID FAST BACILLI SEEN Performed at [facility]    Culture   Final    CULTURE WILL BE EXAMINED FOR 6 WEEKS  BEFORE ISSUING A FINAL REPORT Performed at [facility]    Report Status PENDING  Incomplete  Tissue culture     Status: None   Collection Time: 08/04/15  7:18 PM  Result Value Ref Range Status   Specimen Description TISSUE LEFT SACRAL  Final   Special Requests NONE  Final   Gram Stain   Final    NO WBC SEEN NO ORGANISMS SEEN Performed at [facility]    Culture   Final    MULTIPLE ORGANISMS PRESENT, NONE PREDOMINANT Note: NO GROUP A STREP (S.PYOGENES) ISOLATED NO STAPHYLOCOCCUS AUREUS ISOLATED Performed at [facility]    Report Status 08/07/2015 FINAL  Final    Radiology Reports Ct Abdomen Pelvis Wo Contrast  07/11/2015  CLINICAL DATA:  Sepsis. EXAM: CT ABDOMEN AND PELVIS WITHOUT CONTRAST TECHNIQUE: Multidetector CT imaging of the abdomen and pelvis was performed following the standard protocol without IV contrast. COMPARISON:  CT scan of pelvis of May 02, 2015; CT scan of abdomen pelvis December 11, 2014. FINDINGS: Bilateral posterior basilar opacity is are noted, right greater than left, concerning for possible pneumonia or subsegmental atelectasis. Gastrostomy tube is in grossly good position. IVC filter is noted in infrarenal position. No gallstones are noted. No focal abnormality is noted in the liver, spleen or pancreas on these unenhanced images. Adrenal glands appear normal. Mild left hydronephrosis is noted secondary to 3 mm calculus in the proximal left ureter. Multiple small nonobstructive calculi are noted in right kidney. There is no evidence of bowel obstruction. Ostomy is noted in left lower quadrant. Foley catheter is noted within the urinary bladder. Wall thickening of the urinary bladder is noted which may simply represent lack of distension, but cystitis cannot be excluded. Mild anasarca is noted. Minimal free fluid is noted in the dependent portion of the pelvis. Large sacral decubitus ulcer is noted which is significantly increased in size. There  has been surgical resection of the distal sacrum and coccyx. Another large sacral ulceration is seen extending to the right ischial tuberosity which is unchanged compared to prior exam. No lytic destruction is seen at this time to suggest acute osteomyelitis. IMPRESSION: Nonobstructive right nephrolithiasis. Mild left hydronephrosis is noted secondary to 3 mm calculus seen in the proximal left ureter. Bilateral posterior basilar lung opacities are noted, right greater than left, concerning for pneumonia or possibly atelectasis. Mild anasarca. Foley catheter is present within the urinary bladder. Urinary bladder wall thickening is noted which may be due to lack of distension, but cystitis cannot be excluded. Stable decubitus ulcer seen in right-sided pelvis extending to ischial tuberosity. Large sacral decubitus ulcer is noted which is increased in size compared to prior exam. However, no lytic destruction is seen to suggest active osteomyelitis at this time. Electronically Signed   By: Marijo Conception, M.D.   On: 07/11/2015 14:16   Ct Head Wo Contrast  07/11/2015  CLINICAL DATA:  Acute encephalopathy, probable  sepsis from UTI, paraplegic secondary to spinal cord injury, asthma, type I diabetes mellitus, stroke EXAM: CT HEAD WITHOUT CONTRAST TECHNIQUE: Contiguous axial images were obtained from the base of the skull through the vertex without intravenous contrast. COMPARISON:  01/06/2015 FINDINGS: Normal ventricular morphology. No midline shift or mass effect. Normal appearance of brain parenchyma. No intracranial hemorrhage, mass lesion, or acute infarction. Visualized paranasal sinuses and mastoid air cells clear. Bones unremarkable. IMPRESSION: No acute intracranial abnormalities. Electronically Signed   By: Lavonia Dana M.D.   On: 07/11/2015 14:01   Ct Abdomen Pelvis W Contrast  07/25/2015  CLINICAL DATA:  GERD fever of unknown origin, recent MRSA infection, strep bacteremia, respiratory failure, admitted  with hallucinations and confusion, question UTI ; history spinal cord injury and paraplegia, type I diabetes mellitus EXAM: CT ABDOMEN AND PELVIS WITH CONTRAST TECHNIQUE: Multidetector CT imaging of the abdomen and pelvis was performed using the standard protocol following bolus administration of intravenous contrast. Sagittal and coronal MPR images reconstructed from axial data set. CONTRAST:  159mL OMNIPAQUE IOHEXOL 300 MG/ML SOLN IV. Dilute oral contrast. COMPARISON:  07/11/2015 FINDINGS: Infiltrate RIGHT lower lobe. Mild atelectasis LEFT lower lobe. Peribronchial thickening in both lower lobes with RIGHT lower lobe bronchiectasis. Contracted gallbladder. Scarring at medial aspect of inferior pole RIGHT kidney unchanged. Liver, spleen, pancreas, kidneys, and adrenal glands otherwise unremarkable. IVC filter and gastrostomy tube. Normal appendix. Question bowel wall thickening involving the cecum and ascending colon versus artifact from underdistention. LEFT lower quadrant descending colostomy. Stomach and remaining bowel loops unremarkable. Bladder wall thickening, question related to chronic suprapubic catheterization in urinary tract infection versus chronic outlet obstruction. LEFT ureter distended though this could be related to bladder distention. Vascular structures grossly patent. Large sacral decubitus ulcer with prior distal sacrococcygeal destruction or resection. Foci of soft tissue gas within LEFT gluteus maximus question penetration by decubitus ulcer and infection. Diffuse soft tissue edema. Additional foci of nonspecific soft tissue gas RIGHT lateral abdomen. No mass, adenopathy, free air or free fluid. Bones demineralized. IMPRESSION: Questionable wall thickening of the cecum and ascending colon versus artifact from underdistention, unable to exclude colitis. Diffuse bladder wall thickening which may be related to chronic catheterization/infection or outlet obstruction. Diffuse soft tissue edema  which can be seen with anasarca and hypoproteinemia. Peribronchial thickening in both lower lobes with RIGHT lower lobe bronchiectasis and RIGHT lower lobe infiltrate. Sacral decubitus ulcer with prior distal sacrococcygeal destruction or resection and foci of gas within the LEFT gluteus maximus question infection. Additional nonspecific foci of soft tissue gas in the RIGHT lateral abdominal wall soft tissue planes. Electronically Signed   By: Lavonia Dana M.D.   On: 07/25/2015 17:46   Dg Chest Port 1 View  07/27/2015  CLINICAL DATA:  Tracheostomy and fever EXAM: PORTABLE CHEST 1 VIEW COMPARISON:  07/16/2015 FINDINGS: Tracheostomy tube in unchanged position over the tracheal air column. Heart size and vascular pattern are normal. There are mild bilateral lower lobe infiltrates. When compared to the prior study there both significantly improved. No pleural effusion. Stable mild expansile deformity distal right clavicle possibly due to prior trauma. IMPRESSION: Persistent but improved bilateral lower lobe infiltrates. Electronically Signed   By: Skipper Cliche M.D.   On: 07/27/2015 08:09   Dg Chest Port 1 View  07/16/2015  CLINICAL DATA:  Pneumonia. EXAM: PORTABLE CHEST 1 VIEW COMPARISON:  July 13, 2015. FINDINGS: The heart size and mediastinal contours are within normal limits. Tracheostomy is unchanged in position. No pneumothorax is noted. Increased  bibasilar opacities are noted concerning for edema or pneumonia. Minimal right pleural effusion is noted. The visualized skeletal structures are unremarkable. IMPRESSION: Increased bibasilar opacities are noted concerning for worsening edema or pneumonia. Minimal right pleural effusion is noted. Electronically Signed   By: Marijo Conception, M.D.   On: 07/16/2015 08:29   Dg Chest Port 1 View  07/13/2015  CLINICAL DATA:  Follow-up of respiratory failure EXAM: PORTABLE CHEST 1 VIEW COMPARISON:  Portable chest x-ray of July 10, 2015 FINDINGS: The lungs are  adequately inflated. The interstitial markings are slightly more conspicuous today especially in the infrahilar regions bilaterally greater on the left than on the right. There is no pleural effusion or pneumothorax. The heart and pulmonary vascularity are normal. The tracheostomy appliance tip projects just inferior to the inferior margins of the clavicular heads. IMPRESSION: Interval development of subsegmental atelectasis in the infrahilar regions bilaterally. There is no alveolar infiltrate nor evidence of pulmonary edema. Electronically Signed   By: David  Martinique M.D.   On: 07/13/2015 07:46   Dg Chest Port 1 View  07/10/2015  CLINICAL DATA:  Altered mental status. History of asthma, pneumonia, stroke, diabetes EXAM: PORTABLE CHEST 1 VIEW COMPARISON:  05/30/2015 FINDINGS: There is a tracheostomy tube with the tip 3.5 cm above the carina. There is no focal parenchymal opacity. There is no pleural effusion or pneumothorax. The heart and mediastinal contours are unremarkable. There is an old healed right clavicular fracture. IMPRESSION: No active disease. Electronically Signed   By: Kathreen Devoid   On: 07/10/2015 17:29   Dg Swallowing Func-speech Pathology  07/20/2015  Objective Swallowing Evaluation:   Patient Details Name: KIYOTO SLOMSKI MRN: [id number] Date of Birth: 03/27/1986 Today's Date: 07/20/2015 Time: SLP Start Time (ACUTE ONLY): 1400-SLP Stop Time (ACUTE ONLY): 1420 SLP Time Calculation (min) (ACUTE ONLY): 20 min Past Medical History: Past Medical History Diagnosis Date . GERD (gastroesophageal reflux disease)  . Asthma  . Hx MRSA infection    on face . Gastroparesis  . Diabetic neuropathy ([place])  . Seizures ([place])  . Family history of anesthesia complication    Pt mother can't have epidural procedures . Dysrhythmia  . Pneumonia  . Arthritis  . Fibromyalgia  . Stroke ([place]) 01/29/2014   spinal stroke ; "quadriplegic since" . Type I diabetes mellitus ([place])    sees Dr. Loanne Drilling  . Syncope 02/16/2015 Past  Surgical History: Past Surgical History Procedure Laterality Date . Tonsillectomy   . Multiple extractions with alveoloplasty N/A 08/03/2014   Procedure: MULTIPLE EXTRACTIONS;  Surgeon: Gae Bon, DDS;  Location: [place];  Service: Oral Surgery;  Laterality: N/A; . Tee without cardioversion N/A 08/17/2014   Procedure: TRANSESOPHAGEAL ECHOCARDIOGRAM (TEE);  Surgeon: Dorothy Spark, MD;  Location: [facility] END[facility]COPY;  Service: Cardiovascular;  Laterality: N/A; . Debridment of decubitus ulcer N/A 10/04/2014   Procedure: DEBRIDMENT OF DECUBITUS ULCER;  Surgeon: Georganna Skeans, MD;  Location: [place];  Service: General;  Laterality: N/A; . Laparoscopic diverted colostomy N/A 10/12/2014   Procedure: LAPAROSCOPIC DIVERTING COLOSTOMY;  Surgeon: Donnie Mesa, MD;  Location: [place];  Service: General;  Laterality: N/A; . Insertion of suprapubic catheter N/A 10/12/2014   Procedure: INSERTION OF SUPRAPUBIC CATHETER;  Surgeon: Reece Packer, MD;  Location: [place];  Service: Urology;  Laterality: N/A; . Incision and drainage of wound N/A 11/12/2014   Procedure: IRRIGATION AND DEBRIDEMENT OF WOUNDS WITH BONE BIOPSY AND SURGICAL PREP ;  Surgeon: Theodoro Kos, DO;  Location: [place];  Service: Plastics;  Laterality: N/A; . Application of a-cell of back N/A 11/12/2014   Procedure: APPLICATION A CELL AND VAC ;  Surgeon: Theodoro Kos, DO;  Location: [place];  Service: Plastics;  Laterality: N/A; . Incision and drainage of wound N/A 11/18/2014   Procedure: IRRIGATION AND DEBRIDEMENT OF SACRAL ULCER ONLY WITH PLACEMENT OF A CELL AND VAC/ DRESSING CHANGE TO UPPER BACK AREA.;  Surgeon: Theodoro Kos, DO;  Location: [place];  Service: Plastics;  Laterality: N/A; . Incision and drainage of wound N/A 11/25/2014   Procedure: IRRIGATION AND DEBRIDEMENT OF SACRAL ULCER AND BACK BURN WITH PLACEMENT OF A-CELL;  Surgeon: Theodoro Kos, DO;  Location: [place];  Service: Plastics;  Laterality: N/A; . Minor application of wound vac N/A 11/25/2014   Procedure:  WOUND  VAC CHANGE;  Surgeon: Theodoro Kos, DO;  Location: [place];  Service: Plastics;  Laterality: N/A; . Incision and drainage of wound Right 12/08/2014   Procedure: IRRIGATION AND DEBRIDEMENT SACRAL WOUND AND RIGHT ISCHIAL WOUND ;  Surgeon: Theodoro Kos, DO;  Location: [place];  Service: Plastics;  Laterality: Right; . Application of a-cell of back N/A 12/08/2014   Procedure: APPLICATION OF A-CELL AND WOUND VAC ;  Surgeon: Theodoro Kos, DO;  Location: [place];  Service: Plastics;  Laterality: N/A; . Tee without cardioversion N/A 05/05/2015   Procedure: TRANSESOPHAGEAL ECHOCARDIOGRAM (TEE);  Surgeon: Lelon Perla, MD;  Location: [place];  Service: Cardiovascular;  Laterality: N/A; . Incision and drainage of wound Bilateral 05/05/2015   Procedure: IRRIGATION AND DEBRIDEMENT SACRAL ULCER;  Surgeon: Theodoro Kos, DO;  Location: [place];  Service: Plastics;  Laterality: Bilateral; . Hematoma evacuation N/A 05/05/2015   Procedure: EVACUATION HEMATOMA bedside procedure;  Surgeon: Theodoro Kos, DO;  Location: [place];  Service: Plastics;  Laterality: N/A; HPI: Other Pertinent Information: 29 y.o. male with history of spinal cord injury status post paraplegia who was recently admitted for MRSA and microaerophilic strep bacteremia with respiratory failure requiring trach; nocturnal vent. Admitted due to AMS. Found to have UTI, hypothermia, hypotension, toxic metabolic encephalopathy. In August 2016 pt seen for in-line Passy-Muir Speaking valve, trach'd and continued with PMSV. MBS 05/2015 recommended NPO, received PEG. MBS 10/7 with recs for dysphagia 2, honey thick secondary to silent, deep penetration nectars; mild oral phase deficits.  Pt has been tolerating current diet with valve in place for all POs.  No Data Recorded Assessment / Plan / Recommendation CHL IP CLINICAL IMPRESSIONS 07/20/2015 Therapy Diagnosis WFL Clinical Impression Repeat MBS completed with PMV in place.  Pt with much improved swallow function marked by normal  mastication, brisk swallow response, excellent pharyngeal clearance of all POs, and only transient, high penetration of thin liquids.  Despite successive, large liquid boluses consumed simultaneously with solids, pt protected his airway reliably.  Recommend initiating  a regular diet, thin liquids.  PMV must be in place for all PO consumption.     CHL IP TREATMENT RECOMMENDATION 07/20/2015 Treatment Recommendations Therapy as outlined in treatment plan below   CHL IP DIET RECOMMENDATION 07/20/2015 SLP Diet Recommendations Age appropriate regular solids;Thin Liquid Administration via (None) Medication Administration Whole meds with puree Compensations (None) Postural Changes and/or Swallow Maneuvers (None)   CHL IP OTHER RECOMMENDATIONS 07/20/2015 Recommended Consults (None) Oral Care Recommendations Oral care BID Other Recommendations (None)   CHL IP FOLLOW UP RECOMMENDATIONS 05/27/2015 Follow up Recommendations 24 hour supervision/assistance   CHL IP FREQUENCY AND DURATION 07/20/2015 Speech Therapy Frequency (ACUTE ONLY) min 2x/week Treatment Duration 2 weeks   SLP Swallow  Goals No flowsheet data found. No flowsheet data found.   CHL IP REASON FOR REFERRAL 07/20/2015 Reason for Referral Objectively evaluate swallowing function   CHL IP ORAL PHASE 07/20/2015 Lips (None) Tongue (None) Mucous membranes (None) Nutritional status (None) Other (None) Oxygen therapy (None) Oral Phase WFL Oral - Pudding Teaspoon (None) Oral - Pudding Cup (None) Oral - Honey Teaspoon (None) Oral - Honey Cup (None) Oral - Honey Syringe (None) Oral - Nectar Teaspoon (None) Oral - Nectar Cup (None) Oral - Nectar Straw (None) Oral - Nectar Syringe (None) Oral - Ice Chips (None) Oral - Thin Teaspoon (None) Oral - Thin Cup (None) Oral - Thin Straw (None) Oral - Thin Syringe (None) Oral - Puree (None) Oral - Mechanical Soft (None) Oral - Regular (None) Oral - Multi-consistency (None) Oral - Pill (None) Oral Phase - Comment (None)   CHL IP  PHARYNGEAL PHASE 07/20/2015 Pharyngeal Phase WFL Pharyngeal - Pudding Teaspoon (None) Penetration/Aspiration details (pudding teaspoon) (None) Pharyngeal - Pudding Cup (None) Penetration/Aspiration details (pudding cup) (None) Pharyngeal - Honey Teaspoon (None) Penetration/Aspiration details (honey teaspoon) (None) Pharyngeal - Honey Cup (None) Penetration/Aspiration details (honey cup) (None) Pharyngeal - Honey Syringe (None) Penetration/Aspiration details (honey syringe) (None) Pharyngeal - Nectar Teaspoon (None) Penetration/Aspiration details (nectar teaspoon) (None) Pharyngeal - Nectar Cup (None) Penetration/Aspiration details (nectar cup) (None) Pharyngeal - Nectar Straw (None) Penetration/Aspiration details (nectar straw) (None) Pharyngeal - Nectar Syringe (None) Penetration/Aspiration details (nectar syringe) (None) Pharyngeal - Ice Chips (None) Penetration/Aspiration details (ice chips) (None) Pharyngeal - Thin Teaspoon (None) Penetration/Aspiration details (thin teaspoon) (None) Pharyngeal - Thin Cup (None) Penetration/Aspiration details (thin cup) (None) Pharyngeal - Thin Straw (None) Penetration/Aspiration details (thin straw) (None) Pharyngeal - Thin Syringe (None) Penetration/Aspiration details (thin syringe') (None) Pharyngeal - Puree (None) Penetration/Aspiration details (puree) (None) Pharyngeal - Mechanical Soft (None) Penetration/Aspiration details (mechanical soft) (None) Pharyngeal - Regular (None) Penetration/Aspiration details (regular) (None) Pharyngeal - Multi-consistency (None) Penetration/Aspiration details (multi-consistency) (None) Pharyngeal - Pill (None) Penetration/Aspiration details (pill) (None) Pharyngeal Comment (None)   CHL IP CERVICAL ESOPHAGEAL PHASE 07/15/2015 Cervical Esophageal Phase WFL Pudding Teaspoon (None) Pudding Cup (None) Honey Teaspoon (None) Honey Cup (None) Honey Straw (None) Nectar Teaspoon (None) Nectar Cup (None) Nectar Straw (None) Nectar Sippy Cup (None) Thin  Teaspoon (None) Thin Cup (None) Thin Straw (None) Thin Sippy Cup (None) Cervical Esophageal Comment (None) No flowsheet data found.   Juan Quam Laurice 07/20/2015, 2:37 PM   Dg Swallowing Func-speech Pathology  07/15/2015  Objective Swallowing Evaluation:   Modified Barium Swallow Patient Details Name: BRYLAN DEC MRN: [id number] Date of Birth: [birth year] Today's Date: 07/15/2015 Time: SLP Start Time (ACUTE ONLY): 1347-SLP Stop Time (ACUTE ONLY): 1405 SLP Time Calculation (min) (ACUTE ONLY): 18 min Past Medical History: Past Medical History Diagnosis Date . GERD (gastroesophageal reflux disease)  . Asthma  . Hx MRSA infection    on face . Gastroparesis  . Diabetic neuropathy ([place])  . Seizures ([place])  . Family history of anesthesia complication    Pt mother can't have epidural procedures . Dysrhythmia  . Pneumonia  . Arthritis  . Fibromyalgia  . Stroke ([place]) 01/29/2014   spinal stroke ; "quadriplegic since" . Type I diabetes mellitus ([place])    sees Dr. Loanne Drilling  . Syncope 02/16/2015 Past Surgical History: Past Surgical History Procedure Laterality Date . Tonsillectomy   . Multiple extractions with alveoloplasty N/A 08/03/2014   Procedure: MULTIPLE EXTRACTIONS;  Surgeon: Gae Bon, DDS;  Location: [place];  Service: Oral Surgery;  Laterality: N/A; . Tee without  cardioversion N/A 08/17/2014   Procedure: TRANSESOPHAGEAL ECHOCARDIOGRAM (TEE);  Surgeon: Dorothy Spark, MD;  [facility] ENDOSCOPY;  Service: Cardiovascular;  Laterality: N/A; . Debridment of decubitus ulcer N/A 10/04/2014   Procedure: DEBRIDMENT OF DECUBITUS ULCER;  Surgeon: Georganna Skeans, MD;  Location: [place];  Service: General;  Laterality: N/A; . Laparoscopic diverted colostomy N/A 10/12/2014   Procedure: LAPAROSCOPIC DIVERTING COLOSTOMY;  Surgeon: Donnie Mesa, MD;  Location: [place];  Service: General;  Laterality: N/A; . Insertion of suprapubic catheter N/A 10/12/2014   Procedure: INSERTION OF SUPRAPUBIC CATHETER;  Surgeon: Reece Packer, MD;  Location: [place];  Service: Urology;  Laterality: N/A; . Incision and drainage of wound N/A 11/12/2014   Procedure: IRRIGATION AND DEBRIDEMENT OF WOUNDS WITH BONE BIOPSY AND SURGICAL PREP ;  Surgeon: Theodoro Kos, DO;  Location: [place];  Service: Plastics;  Laterality: N/A; . Application of a-cell of back N/A 11/12/2014   Procedure: APPLICATION A CELL AND VAC ;  Surgeon: Theodoro Kos, DO;  Location: [place];  Service: Plastics;  Laterality: N/A; . Incision and drainage of wound N/A 11/18/2014   Procedure: IRRIGATION AND DEBRIDEMENT OF SACRAL ULCER ONLY WITH PLACEMENT OF A CELL AND VAC/ DRESSING CHANGE TO UPPER BACK AREA.;  Surgeon: Theodoro Kos, DO;  Location: [place];  Service: Plastics;  Laterality: N/A; . Incision and drainage of wound N/A 11/25/2014   Procedure: IRRIGATION AND DEBRIDEMENT OF SACRAL ULCER AND BACK BURN WITH PLACEMENT OF A-CELL;  Surgeon: Theodoro Kos, DO;  Location: [place];  Service: Plastics;  Laterality: N/A; . Minor application of wound vac N/A 11/25/2014   Procedure:  WOUND VAC CHANGE;  Surgeon: Theodoro Kos, DO;  Location: [place];  Service: Plastics;  Laterality: N/A; . Incision and drainage of wound Right 12/08/2014   Procedure: IRRIGATION AND DEBRIDEMENT SACRAL WOUND AND RIGHT ISCHIAL WOUND ;  Surgeon: Theodoro Kos, DO;  Location: [place];  Service: Plastics;  Laterality: Right; . Application of a-cell of back N/A 12/08/2014   Procedure: APPLICATION OF A-CELL AND WOUND VAC ;  Surgeon: Theodoro Kos, DO;  Location: [place];  Service: Plastics;  Laterality: N/A; . Tee without cardioversion N/A 05/05/2015   Procedure: TRANSESOPHAGEAL ECHOCARDIOGRAM (TEE);  Surgeon: Lelon Perla, MD;  Location: [place];  Service: Cardiovascular;  Laterality: N/A; . Incision and drainage of wound Bilateral 05/05/2015   Procedure: IRRIGATION AND DEBRIDEMENT SACRAL ULCER;  Surgeon: Theodoro Kos, DO;  Location: [place];  Service: Plastics;  Laterality: Bilateral; . Hematoma evacuation N/A 05/05/2015    Procedure: EVACUATION HEMATOMA bedside procedure;  Surgeon: Theodoro Kos, DO;  Location: [place];  Service: Plastics;  Laterality: N/A; HPI: Other Pertinent Information: 29 y.o. male with history of spinal cord injury status post paraplegia who was recently admitted for MRSA and microaerophilic strep bacteremia with respiratory failure requiring trach; nocturnal vent. Admitted due to AMS. Found to have UTI, hypothermia, hypotension, toxic metabolic encephalopathy. In August 2016 pt seen for in-line Passy-Muir Speaking valve, trach'd and continued with PMSV. MBS 05/2015 recommended NPO, received PEG. Today repeat MBS for possible initiation of food/liquid. He had Speaking valve from prior admission and currently in place.  No Data Recorded Assessment / Plan / Recommendation CHL IP CLINICAL IMPRESSIONS 07/15/2015 Therapy Diagnosis Mild oral phase dysphagia;Mild pharyngeal phase dysphagia;Moderate pharyngeal phase dysphagia Clinical Impression MBS performed with PMSV in place. Mild prolonged mastication and oral transit with solid texture. Pharyngeal phase characterized by motor impairments with silent laryngeal penetration with nectar with chin tuck posture increasing amount  and depth of penetration. Mild-mod vallecular and pyriform sinus residue reduced with verbal cues for second swallow. Honey thick liquids recommended with second swallow (aspiration risk to high during the swallow with nectar), Dys 2 diet, full assist for feeding and precautions, swallow twice following bites/sips, wear Passy-Muir speaking valve with all meals/meds and upright posture, continued ST.     CHL IP TREATMENT RECOMMENDATION 07/15/2015 Treatment Recommendations Therapy as outlined in treatment plan below   CHL IP DIET RECOMMENDATION 07/15/2015 SLP Diet Recommendations Dysphagia 2 (Fine chop) Liquid Administration via (None) Medication Administration Whole meds with puree Compensations Slow rate;Small sips/bites;Multiple dry swallows after  each bite/sip Postural Changes and/or Swallow Maneuvers (None)   CHL IP OTHER RECOMMENDATIONS 07/15/2015 Recommended Consults (None) Oral Care Recommendations Oral care BID Other Recommendations (None)   CHL IP FOLLOW UP RECOMMENDATIONS 05/27/2015 Follow up Recommendations 24 hour supervision/assistance   CHL IP FREQUENCY AND DURATION 07/15/2015 Speech Therapy Frequency (ACUTE ONLY) min 2x/week Treatment Duration 2 weeks   Pertinent Vitals/Pain none  SLP Swallow Goals No flowsheet data found. No flowsheet data found.   CHL IP REASON FOR REFERRAL 07/15/2015 Reason for Referral Objectively evaluate swallowing function       No flowsheet data found.   [name] 07/15/2015, 2:52 PM Orbie Pyo Colvin Caroli.Ed CCC-SLP Pager [phone]    CBC  Recent Labs Lab 08/04/15 0419 08/05/15 2200 08/07/15 0650 08/08/15 0510 08/09/15 0534  WBC 15.6* 21.8* 20.5* 21.8* 23.4*  HGB 8.0* 8.6* 8.5* 8.1* 7.7*  HCT 26.1* 27.1* 26.6* 25.3* 25.4*  PLT 844* 894* 888* 806* 756*  MCV 85.6 85.0 85.0 86.1 85.8  MCH 26.2 27.0 27.2 27.6 26.0  MCHC 30.7 31.7 32.0 32.0 30.3  RDW 15.3 15.1 15.2 15.0 14.9    Chemistries   Recent Labs Lab 08/04/15 0419 08/05/15 0920 08/05/15 2200 08/07/15 0650 08/07/15 1045 08/08/15 0510 08/09/15 0534  NA 129*  --  130* 134*  --  134* 129*  K 4.5  --  4.7 6.2* 4.5 5.1 5.2*  CL 95*  --  96* 101  --  98* 97*  CO2 26  --  25 21*  --  26 25  GLUCOSE 123* 508* 165* 64*  --  280* 383*  BUN 12  --  10 10  --  10 10  CREATININE 0.58*  --  0.61 0.58*  --  0.61 0.60*  CALCIUM 8.5*  --  9.0 9.2  --  8.7* 8.6*  MG  --   --   --  1.5*  --   --   --    ------------------------------------------------------------------------------------------------------------------ estimated creatinine clearance is 105.4 mL/min (by C-G formula based on Cr of 0.6). ------------------------------------------------------------------------------------------------------------------ No results for input(s):  HGBA1C in the last 72 hours. ------------------------------------------------------------------------------------------------------------------ No results for input(s): CHOL, HDL, LDLCALC, TRIG, CHOLHDL, LDLDIRECT in the last 72 hours. ------------------------------------------------------------------------------------------------------------------ No results for input(s): TSH, T4TOTAL, T3FREE, THYROIDAB in the last 72 hours.  Invalid input(s): FREET3 ------------------------------------------------------------------------------------------------------------------ No results for input(s): VITAMINB12, FOLATE, FERRITIN, TIBC, IRON, RETICCTPCT in the last 72 hours.  Coagulation profile No results for input(s): INR, PROTIME in the last 168 hours.  No results for input(s): DDIMER in the last 72 hours.  Cardiac Enzymes No results for input(s): CKMB, TROPONINI, MYOGLOBIN in the last 168 hours.  Invalid input(s): CK ------------------------------------------------------------------------------------------------------------------ Invalid input(s): [place]  08/08/15 1253 08/08/15 1720 08/08/15 2200 08/09/15 0300 08/09/15 0736 08/09/15 1229  GLUCAP 211* 190* 225* 254* 384* 373*     Jamille Yoshino  L M.D.  [facility] 08/09/2015, 1:02 PM  [email or web address] - password [facility]

## 2015-08-09 NOTE — Progress Notes (Signed)
[place] for Infectious Disease   Reason for visit: Follow up on leukocytosis and fever  Interval History: he is afebrile but WBC remains elevated.  Eating ok.  Went to OR 10/27.  Feels better overall.  Was having problems with foley at home and backflow.   OP report reviewed.  Micro reviewed and bone biopsy with multiple organisms, no WBCs.    Physical Exam: Constitutional:  Filed Vitals:   08/09/15 1151  BP:   Pulse: 97  Temp:   Resp: 18  ; patient appears in NAD Eyes: anicteric HENT: no thrush, trach in place Respiratory: Normal respiratory effort; CTA B Cardiovascular: RRR  Constitutional: negative for fevers and chills Gastrointestinal: negative for diarrhea  Lab Results  Component Value Date   WBC 23.4* 08/09/2015   HGB 7.7* 08/09/2015   HCT 25.4* 08/09/2015   MCV 85.8 08/09/2015   PLT 756* 08/09/2015    Lab Results  Component Value Date   CREATININE 0.60* 08/09/2015   BUN 10 08/09/2015   NA 129* 08/09/2015   K 5.2* 08/09/2015   CL 97* 08/09/2015   CO2 25 08/09/2015    Lab Results  Component Value Date   ALT 53 07/25/2015   AST 60* 07/25/2015   ALKPHOS 135* 07/25/2015     Microbiology: Recent Results (from the past 240 hour(s))  Anaerobic culture     Status: None   Collection Time: 08/04/15  7:08 PM  Result Value Ref Range Status   Specimen Description TISSUE LEFT SACRAL  Final   Special Requests NONE  Final   Gram Stain   Final    NO WBC SEEN NO ORGANISMS SEEN Performed at [facility]    Culture   Final    NO ANAEROBES ISOLATED Performed at [facility]    Report Status 08/09/2015 FINAL  Final  Fungus Culture with Smear     Status: None (Preliminary result)   Collection Time: 08/04/15  7:11 PM  Result Value Ref Range Status   Specimen Description TISSUE LEFT SACRAL  Final   Special Requests NONE  Final   Fungal Smear   Final    NO YEAST OR FUNGAL ELEMENTS SEEN Performed at [facility]    Culture    Final    CULTURE IN PROGRESS FOR FOUR WEEKS Performed at [facility]    Report Status PENDING  Incomplete  AFB culture with smear     Status: None (Preliminary result)   Collection Time: 08/04/15  7:13 PM  Result Value Ref Range Status   Specimen Description TISSUE LEFT SACRAL  Final   Special Requests NONE  Final   Acid Fast Smear   Final    NO ACID FAST BACILLI SEEN Performed at [facility]    Culture   Final    CULTURE WILL BE EXAMINED FOR 6 WEEKS BEFORE ISSUING A FINAL REPORT Performed at [facility]    Report Status PENDING  Incomplete  Tissue culture     Status: None   Collection Time: 08/04/15  7:18 PM  Result Value Ref Range Status   Specimen Description TISSUE LEFT SACRAL  Final   Special Requests NONE  Final   Gram Stain   Final    NO WBC SEEN NO ORGANISMS SEEN Performed at [facility]    Culture   Final    MULTIPLE ORGANISMS PRESENT, NONE PREDOMINANT Note: NO GROUP A STREP (S.PYOGENES) ISOLATED NO STAPHYLOCOCCUS AUREUS ISOLATED Performed at [facility]    Report Status 08/07/2015 FINAL  Final    Impression:  1. Complicated UTi, he is now afebrile but WBC remains up.   2. 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  Based on sensitivities, I will use amikacin, stop vancomycin, flagyl, aztreonam 2. Ulcer with no WBCs and not clearly infected so will stop antibiotics, continued care per Dr. Marla Roe.   3. If improving tomorrow, ok for d/c after amikacin dose tomorrow, does not need line at d/c

## 2015-08-09 NOTE — Care Management Note (Signed)
Case Management Note  Patient Details  Name: Jason [name] MRN: [id number] Date of Birth: November 07, 1985  Subjective/Objective:                    Action/Plan:  UR updated  Expected Discharge Date:                  Expected Discharge Plan:  [place]  In-House Referral:     Discharge planning Services  CM Consult  Post Acute Care Choice:    Choice offered to:     DME Arranged:    DME Agency:     HH Arranged:  RN [place] Agency:  [place]  Status of Service:  In process, will continue to follow  Medicare Important Message Given:    Date Medicare IM Given:    Medicare IM give by:    Date Additional Medicare IM Given:    Additional Medicare Important Message give by:     If discussed at [place] of Stay Meetings, dates discussed:  08-09-15  Additional Comments:  Marilu Favre, RN 08/09/2015, 10:34 AM

## 2015-08-10 DIAGNOSIS — R400 Somnolence: Secondary | ICD-10-CM

## 2015-08-10 DIAGNOSIS — A498 Other bacterial infections of unspecified site: Secondary | ICD-10-CM

## 2015-08-10 DIAGNOSIS — E1065 Type 1 diabetes mellitus with hyperglycemia: Secondary | ICD-10-CM

## 2015-08-10 DIAGNOSIS — G9340 Encephalopathy, unspecified: Secondary | ICD-10-CM

## 2015-08-10 DIAGNOSIS — L89894 Pressure ulcer of other site, stage 4: Secondary | ICD-10-CM

## 2015-08-10 DIAGNOSIS — Z1624 Resistance to multiple antibiotics: Secondary | ICD-10-CM

## 2015-08-10 LAB — BASIC METABOLIC PANEL
Anion gap: 7 (ref 5–15)
BUN: 10 mg/dL (ref 6–20)
CHLORIDE: 97 mmol/L — AB (ref 101–111)
CO2: 28 mmol/L (ref 22–32)
CREATININE: 0.58 mg/dL — AB (ref 0.61–1.24)
Calcium: 8.8 mg/dL — ABNORMAL LOW (ref 8.9–10.3)
Glucose, Bld: 170 mg/dL — ABNORMAL HIGH (ref 65–99)
POTASSIUM: 4.7 mmol/L (ref 3.5–5.1)
SODIUM: 132 mmol/L — AB (ref 135–145)

## 2015-08-10 LAB — CBC
HCT: 24.2 % — ABNORMAL LOW (ref 39.0–52.0)
HEMOGLOBIN: 7.5 g/dL — AB (ref 13.0–17.0)
MCH: 26.4 pg (ref 26.0–34.0)
MCHC: 31 g/dL (ref 30.0–36.0)
MCV: 85.2 fL (ref 78.0–100.0)
PLATELETS: 766 10*3/uL — AB (ref 150–400)
RBC: 2.84 MIL/uL — AB (ref 4.22–5.81)
RDW: 15.1 % (ref 11.5–15.5)
WBC: 25.6 10*3/uL — ABNORMAL HIGH (ref 4.0–10.5)

## 2015-08-10 LAB — GLUCOSE, CAPILLARY
GLUCOSE-CAPILLARY: 137 mg/dL — AB (ref 65–99)
GLUCOSE-CAPILLARY: 148 mg/dL — AB (ref 65–99)
GLUCOSE-CAPILLARY: 187 mg/dL — AB (ref 65–99)
GLUCOSE-CAPILLARY: 197 mg/dL — AB (ref 65–99)
Glucose-Capillary: 208 mg/dL — ABNORMAL HIGH (ref 65–99)
Glucose-Capillary: 210 mg/dL — ABNORMAL HIGH (ref 65–99)

## 2015-08-10 LAB — MAGNESIUM: MAGNESIUM: 1.5 mg/dL — AB (ref 1.7–2.4)

## 2015-08-10 MED ORDER — VITAL 1.0 CAL PO LIQD
720.0000 mL | Freq: Every day | ORAL | Status: DC
  Administered 2015-08-10 – 2015-08-15 (×5): 720 mL
  Filled 2015-08-10 (×12): qty 948

## 2015-08-10 MED ORDER — SODIUM CHLORIDE 0.9 % IV SOLN
75.0000 mg | Freq: Two times a day (BID) | INTRAVENOUS | Status: DC
  Administered 2015-08-11 – 2015-08-16 (×11): 75 mg via INTRAVENOUS
  Filled 2015-08-10 (×15): qty 1

## 2015-08-10 MED ORDER — SODIUM CHLORIDE 0.9 % IV SOLN
300.0000 mg | Freq: Once | INTRAVENOUS | Status: AC
  Administered 2015-08-10: 300 mg via INTRAVENOUS
  Filled 2015-08-10 (×2): qty 4

## 2015-08-10 MED ORDER — MAGNESIUM CHLORIDE 64 MG PO TBEC
1.0000 | DELAYED_RELEASE_TABLET | Freq: Two times a day (BID) | ORAL | Status: DC
  Administered 2015-08-10 – 2015-08-16 (×13): 64 mg via ORAL
  Filled 2015-08-10 (×18): qty 1

## 2015-08-10 NOTE — Progress Notes (Signed)
[place] for Infectious Disease   Reason for visit: Follow up on leukocytosis, UTI  Interval History: he received amikacin and WBC continues to increase, he is more somnolent, fever, poor po.  No diarrhea noted per mother.  No SOB.  Discussed at length allergy history, pt was intubated and did have wheezing with cephalosporin dose.     Physical Exam: Constitutional:  Filed Vitals:   08/10/15 1040  BP: 102/71  Pulse: 83  Temp: 100.2 F (37.9 C)  Resp:   ; patient appears more somnolent than yesterday Eyes: anicteric HENT: trach in place Respiratory: Normal respiratory effort; CTA B Cardiovascular: RRR  ROS Unable to be assessed due to mental status  Lab Results  Component Value Date   WBC 25.6* 08/10/2015   HGB 7.5* 08/10/2015   HCT 24.2* 08/10/2015   MCV 85.2 08/10/2015   PLT 766* 08/10/2015    Lab Results  Component Value Date   CREATININE 0.58* 08/10/2015   BUN 10 08/10/2015   NA 132* 08/10/2015   K 4.7 08/10/2015   CL 97* 08/10/2015   CO2 28 08/10/2015    Lab Results  Component Value Date   ALT 53 07/25/2015   AST 60* 07/25/2015   ALKPHOS 135* 07/25/2015     Microbiology: Recent Results (from the past 240 hour(s))  Anaerobic culture     Status: None   Collection Time: 08/04/15  7:08 PM  Result Value Ref Range Status   Specimen Description TISSUE LEFT SACRAL  Final   Special Requests NONE  Final   Gram Stain   Final    NO WBC SEEN NO ORGANISMS SEEN Performed at [facility]    Culture   Final    NO ANAEROBES ISOLATED Performed at [facility]    Report Status 08/09/2015 FINAL  Final  Fungus Culture with Smear     Status: None (Preliminary result)   Collection Time: 08/04/15  7:11 PM  Result Value Ref Range Status   Specimen Description TISSUE LEFT SACRAL  Final   Special Requests NONE  Final   Fungal Smear   Final    NO YEAST OR FUNGAL ELEMENTS SEEN Performed at [facility]    Culture   Final    CULTURE  IN PROGRESS FOR FOUR WEEKS Performed at [facility]    Report Status PENDING  Incomplete  AFB culture with smear     Status: None (Preliminary result)   Collection Time: 08/04/15  7:13 PM  Result Value Ref Range Status   Specimen Description TISSUE LEFT SACRAL  Final   Special Requests NONE  Final   Acid Fast Smear   Final    NO ACID FAST BACILLI SEEN Performed at [facility]    Culture   Final    CULTURE WILL BE EXAMINED FOR 6 WEEKS BEFORE ISSUING A FINAL REPORT Performed at [facility]    Report Status PENDING  Incomplete  Tissue culture     Status: None   Collection Time: 08/04/15  7:18 PM  Result Value Ref Range Status   Specimen Description TISSUE LEFT SACRAL  Final   Special Requests NONE  Final   Gram Stain   Final    NO WBC SEEN NO ORGANISMS SEEN Performed at [facility]    Culture   Final    MULTIPLE ORGANISMS PRESENT, NONE PREDOMINANT Note: NO GROUP A STREP (S.PYOGENES) ISOLATED NO STAPHYLOCOCCUS AUREUS ISOLATED Performed at [facility]  Report Status 08/07/2015 FINAL  Final    Impression:  1. Likely complicated UTI 2. MDRO  Plan: 1.  Will try colistin and monitor creat closely.   2.  Only other option will be cephalosporin-based Avycaz.

## 2015-08-10 NOTE — Progress Notes (Signed)
[facility] PROGRESS NOTE  Jason [name] POE:[id number] DOB: [birth year] DOA: 07/10/2015 PCP: [name], MD  Brief Summary  Jason [name] is a 29 y.o. 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  He has had numerous hospitalizations over the last few years and has become progressively sicker.  He developed pulmonary embolism requiring IVC filter placement.  In August, he underwent tracheostomy placement secondary to ARDS.  He has also had infections with Burkholderia and Acinetobacter and is closely followed by infectious disease.  He has developed extensive antibiotic resistance.  During his last admission, he met with palliative care at which time he continued to endorse a good quality of life and maintained full code status.  He was readmitted with delirium and sepsis likely from UTI secondary to his chronic suprapubic catheter and from osteomyelitis.  He was started on broad spectrum antibiotics and ID was consulted.  Due to ongoing fevers and leukocytosis, he underwent CT scan which demonstrated osteomyelitis and he underwent bone biopsy on 10/27 by Dr. Migdalia Dk.  Bone culture grew mixed flora and ID felt that this was not the source of his fevers/illness.  Repeated urine cultures grew Pseudomonas and ID recommended treating with amikacin, however, he had worsening leukocytosis and ongoing fevers.    10/27:  Bone biopsy by Dr. Migdalia Dk 11/2:  D/c amikacin and start polymyxin for PSA UTI  Assessment/Plan  Toxic metabolic encephalopathy, increasingly confused and lethargic over last 24 hours -  Ongoing evaluation and management of his infections, see below -  Consider ABG if not arousing -  Kidney function  appears stable so doubt that this is accumulation of sedating metabolites, although he is on quite a few sedating medications  Sepsis due to MDR Pseudomonas Aeruginosa UTI ? Vs decubitus/osteomyelitis - suprapubic catheter changed by urology - ID and plastics following: CT Scan showed: Sacral decubitus ulcer with prior distal sacrococcygeal destruction or resection and foci of gas within the LEFT gluteus maximus question infection. - Dr. Migdalia Dk.s/p biopsy on 10/27- cultures with multiple species Discussed with Dr. Linus Salmons. He recommends treating Pseudomonas in urine with amikacin and monitoring fever curve and leukocytosis. Was found to be resistant to fosfomycin of which he received 2 courses. He doubts osteomyelitis is the cause of current leukocytosis and fevers. Suprapubic catheter changed twice this hospitalization. -  D/c amikacin -  Start polymyxin  Acute renal failure Due to sepsis -resolved  Hyponatremia  Mild, corrects toward normal considering hyperglycemia  Hypomagnesemia: start oral magnesium supplementation  Diabetes mellitus type 1 with hyperglycemia -  Due to decreased PO, will change diet back to regular and cover with higher doses of insulin -  lantus 18 units today -  Continue aspart with meals plus SSI  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 S/p bone biopsy, acell  Chronic anemia, hemoglobin drifting down - consider blood transfusion for hgb < 7.    Chronic respiratory failure with trach and vent pulmonary critical care supposed to be managing vent - it appears the patient took himself off the vent 10/7 and has not been back on since - currently appears stable on 28% trach collar  - As per PCCM ELink note 10/3, it appears that patient is followed in OP trach clinic> will defer further trach decisions  to OP follow up in that clinic. - Dr. Eliseo Squires discussed with CCM M.D. on call 10/16: Agrees with outpatient follow-up with trach clinic and indicates that he  may not be decannulated.   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 IVC filter placed 8/6 -  Not on A/C due to need for frequent debridements which have caused extensive bleeding, previously on apixaban  History of spinal cord infarct with paraplegia, partial tetraplegia  Severe malnutrition in context of chronic illness -  On night time TF  Diet:  Regular with tube feeds Access:  PIV IVF:  off Proph:  lovenox  Code Status: full Family Communication: patient and his mother Disposition Plan:  Once WBC trending down, fever trending down, mentation improving.  Dr. Linus Salmons relayed that he is concerned that we are running out of antibiotics with which to treat his infections.    Consultants:  PCCM   Infectious disease  Plastics- [facility]  Procedures:  10/27:  Bone biopsy  Antibiotics:  Amikacin > 11/2  Colisthimethate 11/2 >  HPI/Subjective:  Sleepy, but denies cough, SOB.  Had some nausea without vomiting.  Not eating much. Feels hot and flushed and sweaty.    Objective: Filed Vitals:   08/10/15 0700 08/10/15 1040 08/10/15 1100 08/10/15 1423  BP:  102/71  115/76  Pulse: 92 83 84 76  Temp:  100.2 F (37.9 C)  98.8 F (37.1 C)  TempSrc:  Oral  Oral  Resp: _0 Height:      Weight:      SpO2: 100% 100% 100% 100%    Intake/Output Summary (Last 24 hours) at 08/10/15 1430 Last data filed at 08/10/15 0650  Gross per 24 hour  Intake    120 ml  Output   1600 ml  Net  -1480 ml   Filed Weights   08/08/15 0554 08/09/15 0444 08/10/15 0649  Weight: 55.23 kg (121 lb 12.2 oz) 55.7 kg (122 lb 12.7 oz) 55.8 kg (123 lb 0.3 oz)   Body mass index is 19.26 kg/(m^2).  Exam:   General:  Thin male, sleepy but arouses to answer a few questions, warm to touch all over, No acute distress  HEENT:  NCAT, MMM  Cardiovascular:  RRR, nl S1, S2 no mrg, 2+ pulses, warm extremities  Respiratory:  CTAB, no increased WOB  Abdomen:   NABS, soft, NT/ND.  Ostomy bag full with copious light  brown liquid stool.  Suprapubic catheter with clear yellow urine, no purulence or discharge.  Gastrostomy tube site c/d/i.    MSK:   Contractures of hands, unable to move legs with decreased muscle bulk   Neuro:  Partial tetraplegic.    Skin:  Stage 2 ulcer on mid back without evidence of infection, healthy bleeding granulation tissue.  Stage V sacral/buttock ulcers with a small amount of dusky material, but generally healthy appearing granulation tissue, no purulence, foul odor, or indurated margins.  Left foot:  Heel and left lateral foot with stage V ulcers that again do not appear infected, healthy bleeding.    Data Reviewed: Basic Metabolic Panel:  Recent Labs Lab 08/05/15 2200 08/07/15 0650 08/07/15 1045 08/08/15 0510 08/09/15 0534 08/10/15 0500  NA 130* 134*  --  134* 129* 132*  K 4.7 6.2* 4.5 5.1 5.2* 4.7  CL 96* 101  --  98* 97* 97*  CO2 25 21*  --  _1 GLUCOSE 165* 64*  --  280* 383* 170*  BUN 10 10  --  10  10 10  CREATININE 0.61 0.58*  --  0.61 0.60* 0.58*  CALCIUM 9.0 9.2  --  8.7* 8.6* 8.8*  MG  --  1.5*  --   --   --  1.5*   Liver Function Tests: No results for input(s): AST, ALT, ALKPHOS, BILITOT, PROT, ALBUMIN in the last 168 hours. No results for input(s): LIPASE, AMYLASE in the last 168 hours. No results for input(s): AMMONIA in the last 168 hours. CBC:  Recent Labs Lab 08/05/15 2200 08/07/15 0650 08/08/15 0510 08/09/15 0534 08/10/15 0500  WBC 21.8* 20.5* 21.8* 23.4* 25.6*  HGB 8.6* 8.5* 8.1* 7.7* 7.5*  HCT 27.1* 26.6* 25.3* 25.4* 24.2*  MCV 85.0 85.0 86.1 85.8 85.2  PLT 894* 888* 806* 756* 766*    Recent Results (from the past 240 hour(s))  Anaerobic culture     Status: None   Collection Time: 08/04/15  7:08 PM  Result Value Ref Range Status   Specimen Description TISSUE LEFT SACRAL  Final   Special Requests NONE  Final   Gram Stain   Final    NO WBC SEEN NO ORGANISMS SEEN Performed at [facility]    Culture   Final    NO  ANAEROBES ISOLATED Performed at [facility]    Report Status 08/09/2015 FINAL  Final  Fungus Culture with Smear     Status: None (Preliminary result)   Collection Time: 08/04/15  7:11 PM  Result Value Ref Range Status   Specimen Description TISSUE LEFT SACRAL  Final   Special Requests NONE  Final   Fungal Smear   Final    NO YEAST OR FUNGAL ELEMENTS SEEN Performed at [facility]    Culture   Final    CULTURE IN PROGRESS FOR FOUR WEEKS Performed at [facility]    Report Status PENDING  Incomplete  AFB culture with smear     Status: None (Preliminary result)   Collection Time: 08/04/15  7:13 PM  Result Value Ref Range Status   Specimen Description TISSUE LEFT SACRAL  Final   Special Requests NONE  Final   Acid Fast Smear   Final    NO ACID FAST BACILLI SEEN Performed at [facility]    Culture   Final    CULTURE WILL BE EXAMINED FOR 6 WEEKS BEFORE ISSUING A FINAL REPORT Performed at [facility]    Report Status PENDING  Incomplete  Tissue culture     Status: None   Collection Time: 08/04/15  7:18 PM  Result Value Ref Range Status   Specimen Description TISSUE LEFT SACRAL  Final   Special Requests NONE  Final   Gram Stain   Final    NO WBC SEEN NO ORGANISMS SEEN Performed at [facility]    Culture   Final    MULTIPLE ORGANISMS PRESENT, NONE PREDOMINANT Note: NO GROUP A STREP (S.PYOGENES) ISOLATED NO STAPHYLOCOCCUS AUREUS ISOLATED Performed at [facility]    Report Status 08/07/2015 FINAL  Final     Studies: No results found.  Scheduled Meds: . baclofen  10 mg Oral 2 times per day  . baclofen  20 mg Oral QHS  . colistimethate (COLISTIN) IV  300 mg Intravenous Once  . [START ON 08/11/2015] colistimethate (COLISTIN) IV  75 mg Intravenous Q12H  . collagenase   Topical Daily  . enoxaparin (LOVENOX) injection  40 mg Subcutaneous Daily  . fentaNYL  50 mcg Transdermal Q72H  . Influenza vac  split  quadrivalent PF  0.5 mL Intramuscular Tomorrow-1000  . insulin aspart  0-15 Units Subcutaneous TID WC  . insulin aspart  0-5 Units Subcutaneous QHS  . insulin aspart  5 Units Subcutaneous TID WC  . insulin glargine  18 Units Subcutaneous Daily  . magnesium chloride  1 tablet Oral BID  . midodrine  30 mg Oral TID WC  . multivitamin with minerals  1 tablet Oral Daily  . pantoprazole sodium  40 mg Oral Daily  . pregabalin  100 mg Oral 3 times per day  . pregabalin  200 mg Oral QHS  . sodium chloride  10-40 mL Intracatheter Q12H   Continuous Infusions: . sodium chloride 10 mL/hr at 08/05/15 1038  . feeding supplement (VITAL 1.0 CAL) 1,000 mL (08/09/15 1906)  . lactated ringers 10 mL/hr at 08/04/15 1721    Principal Problem:   Acute encephalopathy Active Problems:   Palliative care encounter   Chronic anemia   Uncontrolled type 1 diabetes mellitus with hyperglycemia ([name])   Altered mental status   Tracheostomy dependent ([name])   H/O spinal cord injury   Chronic paraplegia ([name])   Acute on chronic respiratory failure ([name])   Hypomagnesemia   Decubitus ulcer of right perineal ischial region, stage 4 ([name])   Decubitus ulcer of left perineal ischial region, stage 4 ([name])   Decubitus ulcer of sacral region, stage 4 ([name])   Complicated urinary tract infection   Infection with Pseudomonas aeruginosa resistant to multiple drugs   MDR Acinetobacter baumannii infection   History of MDR Pseudomonas aeruginosa infection    Time spent: 30 min    Jason [name], [facility] Pager [phone]. If 7PM-7AM, please contact night-coverage at [email or web address], password [facility] 08/10/2015, 2:30 PM  LOS: 31 days

## 2015-08-11 DIAGNOSIS — T83518A Infection and inflammatory reaction due to other urinary catheter, initial encounter: Principal | ICD-10-CM

## 2015-08-11 LAB — GLUCOSE, CAPILLARY
GLUCOSE-CAPILLARY: 166 mg/dL — AB (ref 65–99)
GLUCOSE-CAPILLARY: 210 mg/dL — AB (ref 65–99)
Glucose-Capillary: 109 mg/dL — ABNORMAL HIGH (ref 65–99)
Glucose-Capillary: 179 mg/dL — ABNORMAL HIGH (ref 65–99)
Glucose-Capillary: 36 mg/dL — CL (ref 65–99)
Glucose-Capillary: 103 mg/dL — ABNORMAL HIGH (ref 65–99)
Glucose-Capillary: 24 mg/dL — CL (ref 65–99)

## 2015-08-11 LAB — CBC
HEMATOCRIT: 24.1 % — AB (ref 39.0–52.0)
Hemoglobin: 7.6 g/dL — ABNORMAL LOW (ref 13.0–17.0)
MCH: 27.3 pg (ref 26.0–34.0)
MCHC: 31.5 g/dL (ref 30.0–36.0)
MCV: 86.7 fL (ref 78.0–100.0)
Platelets: 768 10*3/uL — ABNORMAL HIGH (ref 150–400)
RBC: 2.78 MIL/uL — ABNORMAL LOW (ref 4.22–5.81)
RDW: 15 % (ref 11.5–15.5)
WBC: 25.7 10*3/uL — ABNORMAL HIGH (ref 4.0–10.5)

## 2015-08-11 LAB — BASIC METABOLIC PANEL
Anion gap: 5 (ref 5–15)
BUN: 10 mg/dL (ref 6–20)
CO2: 26 mmol/L (ref 22–32)
Calcium: 8.7 mg/dL — ABNORMAL LOW (ref 8.9–10.3)
Chloride: 101 mmol/L (ref 101–111)
Creatinine, Ser: 0.65 mg/dL (ref 0.61–1.24)
GFR calc Af Amer: >60 mL/min (ref >60)
GLUCOSE: 161 mg/dL — AB (ref 65–99)
POTASSIUM: 4.6 mmol/L (ref 3.5–5.1)
Sodium: 132 mmol/L — ABNORMAL LOW (ref 135–145)

## 2015-08-11 MED ORDER — DEXTROSE 50 % IV SOLN
INTRAVENOUS | Status: AC
  Administered 2015-08-11: 50 mL
  Filled 2015-08-11: qty 50

## 2015-08-11 NOTE — Trach Care Team (Signed)
[place] Progression Note   Patient Details Name: Jason [name] MRN: [id number] DOB: 02/27/1986 Today's Date: 08/11/2015   Tracheostomy Assessment    Tracheostomy Shiley 6 mm Cuffed (Active)  Status Passy Muir Speaking valve 08/11/2015  2:00 PM  Site Assessment Clean;Dry 08/11/2015  2:00 PM  Site Care Cleansed;Dried;Dressing applied 08/11/2015  2:00 PM  Inner Cannula Care Changed/new 08/11/2015  2:00 PM  Ties Assessment Clean;Dry;Secure 08/11/2015  2:00 PM  Cuff pressure (cm) 0 cm 08/11/2015  7:45 AM  Trach Changed Yes 08/01/2015  4:00 AM  Emergency Equipment at bedside Yes 08/11/2015  2:00 PM     Care Needs     Respiratory Therapy Tracheostomy: Chronic trach O2 Device: Not Delivered FiO2 (%): 21 % SpO2: 100 % Education: Ongoing Who was educated?: Patient, Family Follow up recommendations:  (will follow for progression) Respiratory barriers to progression: Other (comment) (fevers)    Speech Language Pathology  SLP chart review complete: Patient does not need SLP services at this time (has PMSV prior visit, seeing for swallow only) Patient may use Passy-Muir Speech Valve: During all waking hours (remove during sleep) PMSV Supervision: Intermittent Follow up Recommendations: None   Physical Therapy      Occupational Therapy      Nutritional Patient's Current Diet: Regular Tube Feeding: Vital AF 1.2 Cal Tube Feeding Frequency: Intermittent (6p-6a) Tube Feeding Strength: Full strength SLP Diet Recommendations: Age appropriate regular solids, Thin    Case Management/Social Work      Clinical biochemist Care Team/Provider Recommendations [place] Team Members Present-  Phillis Knack, RT, Alvino Blood, SW,  Herbie Baltimore, SLP    Followed by trach clinic and being evaluated for timing of possible decannulation.           Charlise Giovanetti, Jaci Carrel 08/11/2015, 2:29 PM Scribe for team

## 2015-08-11 NOTE — Care Management Note (Signed)
Case Management Note  Patient Details  Name: AMADOR BRADDY MRN: [id number] Date of Birth: 11-Feb-1986  Subjective/Objective:                    Action/Plan:  UR updated . Faxed updated home health wound care orders to [name] at [facility] .  Expected Discharge Date:                  Expected Discharge Plan:  [place]  In-House Referral:     Discharge planning Services  CM Consult  Post Acute Care Choice:    Choice offered to:     DME Arranged:    DME Agency:     HH Arranged:  RN [place] Agency:  [facility]  Status of Service:  In process, will continue to follow  Medicare Important Message Given:    Date Medicare IM Given:    Medicare IM give by:    Date Additional Medicare IM Given:    Additional Medicare Important Message give by:     If discussed at [name] of Stay Meetings, dates discussed:  08-11-15   Additional Comments:  Marilu Favre, RN 08/11/2015, 10:16 AM

## 2015-08-11 NOTE — Progress Notes (Signed)
Nutrition Follow-up  DOCUMENTATION CODES:   Underweight, Severe malnutrition in context of chronic illness  INTERVENTION:    Continue MVI  Continue regular diet  Continue Vital AF 1.0 formula at 60 ml/hr from 6 PM to 6 AM. Total time is 12 hours.  NUTRITION DIAGNOSIS:   Inadequate oral intake related to chronic illness as evidenced by  (PO intake </= 50%).  Progressing   GOAL:   Patient will meet greater than or equal to 90% of their needs  Progressing  MONITOR:   TF tolerance, PO intake, Labs, Weight trends, Skin, I & O's  REASON FOR ASSESSMENT:   Consult Calorie Count  ASSESSMENT:   [name] with PMH of spinal cord injury s/p paraplegia who was recently admitted for MRSA and microaerophilic strep bacteremia with respiratory failure requiring trach (vent at night); brought to ER after patient was found to have a hallucinations and confusion. Patient was recently tapered off his IV antibiotics and was placed on doxycycline and Cipro was started 3 days ago for possible UTI. In ER patient UA showed features concerning for UTI and patient has chronic suprapubic catheter. Patient has been admitted for possible developing sepsis.   S/p Procedure(s) (LRB) on 08/04/15: IRRIGATION AND DEBRIDEMENT OF SACRAL ULCER AND (N/A) APPLICATION OF A-CELL (N/A)  ID continues to follow; continues to adjust antibiotic regimen due to infections.   He remains on a regular diet; PO: 95-100%. Pt continues to experience hyperglycemia, however, suspect this is multifactorial.   Pt remains on nocturnal feeding regimen of Vital AF 1.0 via PEG (6 PM to 6 AM) providing 720 kcals, 29 gm protein, 606 ml of free water.   RNCM continues to follow for home health needs. Per MD notes, plan to d/c once WBC and fever trending down.   Labs reviewed: Na: 132 (on IV supplementation). CBGS: 109-197 (improving).   Diet Order:  Diet regular Room service appropriate?: Yes; Fluid consistency::  Thin  Skin:   Left plantar heel with stage 2 pressure injury Left plantar foot near heel with unstageable pressure injury Left anterior foot near small toe stage 2 pressure injury  Left outer foot stage 2 pressure injury  Right ischium with stage 4 pressure injury Left ischium with stage 4 pressure injury Left hip with stage 4 pressure injury Left posterior back with chronic full thickness wound  Last BM:  08/11/15  Height:   Ht Readings from Last 1 Encounters:  07/13/15 5\' 7"  (1.702 m)    Weight:   Wt Readings from Last 1 Encounters:  08/11/15 134 lb 14.4 oz (61.19 kg)    Ideal Body Weight:  70 kg  BMI:  Body mass index is 21.12 kg/(m^2).  Estimated Nutritional Needs:   Kcal:  1900-2100  Protein:  95-110  Fluid:  >1.9 L  EDUCATION NEEDS:   No education needs identified at this time  Amilya Haver [name], RD, LDN, CDE Pager: [phone] After hours Pager: [phone]

## 2015-08-11 NOTE — Progress Notes (Signed)
Patient seen for trach team follow up.  Mom at bedside, no education needed at this time.  All needed equipment at the bedside.  Will continue to follow.

## 2015-08-11 NOTE — Progress Notes (Addendum)
[place] for Infectious Disease   Reason for visit: Follow up on complicated UTI  Interval History: he now is alert, feeling better, afebrile > 24 hours.  Eating well. Felt better about 45 min after receiving antibiotic yesterday.   Physical Exam: Constitutional:  Filed Vitals:   08/11/15 0745  BP:   Pulse: 85  Temp:   Resp: 18  ; patient appears in NAD Eyes: anicteric HENT: trach in place Respiratory: Normal respiratory effort; CTA B Cardiovascular: RRR  ROS: Constitutional: negative for fevers Gastrointestinal: negative for nausea and diarrhea Genitourinary: negative for hematuria  Lab Results  Component Value Date   WBC 25.7* 08/11/2015   HGB 7.6* 08/11/2015   HCT 24.1* 08/11/2015   MCV 86.7 08/11/2015   PLT 768* 08/11/2015    Lab Results  Component Value Date   CREATININE 0.65 08/11/2015   BUN 10 08/11/2015   NA 132* 08/11/2015   K 4.6 08/11/2015   CL 101 08/11/2015   CO2 26 08/11/2015    Lab Results  Component Value Date   ALT 53 07/25/2015   AST 60* 07/25/2015   ALKPHOS 135* 07/25/2015     Microbiology: Recent Results (from the past 240 hour(s))  Anaerobic culture     Status: None   Collection Time: 08/04/15  7:08 PM  Result Value Ref Range Status   Specimen Description TISSUE LEFT SACRAL  Final   Special Requests NONE  Final   Gram Stain   Final    NO WBC SEEN NO ORGANISMS SEEN Performed at [facility]    Culture   Final    NO ANAEROBES ISOLATED Performed at [facility]    Report Status 08/09/2015 FINAL  Final  Fungus Culture with Smear     Status: None (Preliminary result)   Collection Time: 08/04/15  7:11 PM  Result Value Ref Range Status   Specimen Description TISSUE LEFT SACRAL  Final   Special Requests NONE  Final   Fungal Smear   Final    NO YEAST OR FUNGAL ELEMENTS SEEN Performed at [facility]    Culture   Final    YEAST ISOLATED;ID TO FOLLOW Performed at [facility]    Report  Status PENDING  Incomplete  AFB culture with smear     Status: None (Preliminary result)   Collection Time: 08/04/15  7:13 PM  Result Value Ref Range Status   Specimen Description TISSUE LEFT SACRAL  Final   Special Requests NONE  Final   Acid Fast Smear   Final    NO ACID FAST BACILLI SEEN Performed at [facility]    Culture   Final    CULTURE WILL BE EXAMINED FOR 6 WEEKS BEFORE ISSUING A FINAL REPORT Performed at [facility]    Report Status PENDING  Incomplete  Tissue culture     Status: None   Collection Time: 08/04/15  7:18 PM  Result Value Ref Range Status   Specimen Description TISSUE LEFT SACRAL  Final   Special Requests NONE  Final   Gram Stain   Final    NO WBC SEEN NO ORGANISMS SEEN Performed at [facility]    Culture   Final    MULTIPLE ORGANISMS PRESENT, NONE PREDOMINANT Note: NO GROUP A STREP (S.PYOGENES) ISOLATED NO STAPHYLOCOCCUS AUREUS ISOLATED Performed at [facility]    Report Status 08/07/2015 FINAL  Final  Culture, Urine     Status: None (Preliminary result)  Collection Time: 08/10/15 10:59 AM  Result Value Ref Range Status   Specimen Description URINE, SUPRAPUBIC  Final   Special Requests NONE  Final   Culture TOO YOUNG TO READ  Final   Report Status PENDING  Incomplete    Impression:  1. Complicated UTI from suprapubic catheter 2.  MDRO history 3. Encephalopathy, resolved; likely from #1 4. Decubitus ulcer, culture with multiple organisms, none predominate and no WBCs, OP report did not find significant infection.   Plan: 1. Continue with colistin for 10 days total, will need creat every 2 days while on it 2. No indication for treatment of ulcer

## 2015-08-11 NOTE — Progress Notes (Signed)
Hypoglycemic Event  CBG: 39  Treatment: 15 GM carbohydrate snack  Symptoms: Shaky and Hungry  Follow-up CBG: Time:1636 CBG Result:24  Possible Reasons for Event: Medication regimen: Lantus  Comments/MD notified:Dr. Short  Additional Follow up CBG: Time: 1648    CBG Result: 103    Anna Beaird B

## 2015-08-11 NOTE — Progress Notes (Signed)
[facility] PROGRESS NOTE  MALIC ROSTEN MOQ:[id number] DOB: 28-Sep-1986 DOA: 07/10/2015 PCP: [name], MD  Brief Summary  Jason [name] is a 29 y.o. 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  He has had numerous hospitalizations over the last few years and has become progressively sicker.  He developed pulmonary embolism requiring IVC filter placement.  In August, he underwent tracheostomy placement secondary to ARDS.  He has also had infections with Burkholderia and Acinetobacter and is closely followed by infectious disease.  He has developed extensive antibiotic resistance.  During his last admission, he met with palliative care at which time he continued to endorse a good quality of life and maintained full code status.  He was readmitted with delirium and sepsis likely from UTI secondary to his chronic suprapubic catheter and from osteomyelitis.  He was started on broad spectrum antibiotics and ID was consulted.  Due to ongoing fevers and leukocytosis, he underwent CT scan which demonstrated osteomyelitis and he underwent bone biopsy on 10/27 by Dr. Migdalia Dk.  Bone culture grew mixed flora and ID felt that this was not the source of his fevers/illness.  Repeated urine cultures grew Pseudomonas and ID recommended treating with amikacin, however, he had worsening leukocytosis and ongoing fevers.    10/27:  Bone biopsy by Dr. Migdalia Dk 11/2:   D/c amikacin and start colistin for PSA UTI  Assessment/Plan  Toxic metabolic encephalopathy, improved after changing to colistin -  Ongoing evaluation and management of his infections, see below -  Kidney function appears stable so doubt that this is accumulation of  sedating metabolites, although he is on quite a few sedating medications  Sepsis due to MDR Pseudomonas Aeruginosa catheter-associated UTI present at time of admission (indwelling suprapubic catheter) and decubitus osteomyelitis - Suprapubic catheter changed twice by urology since admission - ID and plastics following -  CT Scan showed: Sacral decubitus ulcer with prior distal sacrococcygeal destruction or resection and foci of gas within the LEFT gluteus maximus question infection. - Dr. Migdalia Dk s/p biopsy on 10/27- cultures with multiple species - antibiotics narrowed to treat Pseudomonas in urine with amikacin, however, he continued to spike fevers and had rising WBC count. -  Continue colistin day 2  Acute renal failure due to sepsis, now resolved.  Hyponatremia  Mild, corrects toward normal considering hyperglycemia but persists probably due to osmolality of tube feeds  Hypomagnesemia: start oral magnesium supplementation  Diabetes mellitus type 1, CBG well controlled -  Continue lantus 18 units today -  Continue aspart with meals plus SSI  Sacral decubitus and left flank decubitus - S/p bone biopsy, acell  Chronic anemia, hemoglobin stable around 7.66m/dl - consider blood transfusion for hgb < 7.    Chronic respiratory failure with trach and vent - Patient took himself off the vent 10/7 and currently stable on trach collar  - CCM M.D. on call 10/16: Agrees with outpatient follow-up with trach clinic and indicates that he may not be decannulated.   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 -  IVC filter placed 8/6 -  Not on A/C due to need for frequent debridements which have caused extensive bleeding, previously on apixaban  History of spinal cord infarct with paraplegia, partial tetraplegia  Severe malnutrition in context of chronic illness -  On night time TF  Diet:  Regular with tube feeds Access:  PIV IVF:  off Proph:  lovenox  Code Status: full Family Communication: patient  and his mother Disposition Plan:  Possibly home with PICC line and IV antibiotics as early as tomorrow if WBC trending down, temperature trending down, maintaining mentation.  Consultants:  PCCM   Infectious disease  Plastics- [facility]  Procedures:  10/27:  Bone biopsy  Antibiotics:  Amikacin > 11/2  Colisthimethate 11/2 >   HPI/Subjective:  Feels more alert today.  Denies cough, SOB, nausea.     Objective: Filed Vitals:   08/11/15 0134 08/11/15 0439 08/11/15 0502 08/11/15 0745  BP:  100/72    Pulse: 79 78 81 85  Temp:  99 F (37.2 C)    TempSrc:  Oral    Resp: 16 16 18 18   Height:      Weight:  61.19 kg (134 lb 14.4 oz)    SpO2: 100% 100% 100% 100%    Intake/Output Summary (Last 24 hours) at 08/11/15 1424 Last data filed at 08/11/15 1120  Gross per 24 hour  Intake    480 ml  Output   3975 ml  Net  -3495 ml   Filed Weights   08/09/15 0444 08/10/15 0649 08/11/15 0439  Weight: 55.7 kg (122 lb 12.7 oz) 55.8 kg (123 lb 0.3 oz) 61.19 kg (134 lb 14.4 oz)   Body mass index is 21.12 kg/(m^2).  Exam:   General:  Thin male, awake, alert, No acute distress  HEENT:  NCAT, MMM  Cardiovascular:  RRR, nl S1, S2 no mrg, 2+ pulses, warm extremities  Respiratory:  CTAB, no increased WOB  Abdomen:   NABS, soft, NT/ND.  Ostomy bag full with copious light brown liquid stool.  Suprapubic catheter with clear yellow urine, no purulence or discharge.  Gastrostomy tube site c/d/i.    MSK:   Contractures of hands, unable to move legs with decreased muscle bulk   Skin:  Did not observe ulcers today.  Refer to progress note from 11/2.  Data Reviewed: Basic Metabolic Panel:  Recent Labs Lab 08/07/15 0650 08/07/15 1045 08/08/15 0510 08/09/15 0534 08/10/15 0500 08/11/15 0525  NA 134*  --  134* 129* 132* 132*  K 6.2* 4.5 5.1 5.2* 4.7 4.6  CL 101  --  98* 97* 97* 101  CO2 21*  --  26 25 28 26   GLUCOSE 64*  --  280* 383* 170* 161*  BUN 10  --  10 10 10 10    CREATININE 0.58*  --  0.61 0.60* 0.58* 0.65  CALCIUM 9.2  --  8.7* 8.6* 8.8* 8.7*  MG 1.5*  --   --   --  1.5*  --    Liver Function Tests: No results for input(s): AST, ALT, ALKPHOS, BILITOT, PROT, ALBUMIN in the last 168 hours. No results for input(s): LIPASE, AMYLASE in the last 168 hours. No results for input(s): AMMONIA in the last 168 hours. CBC:  Recent Labs Lab 08/07/15 0650 08/08/15 0510 08/09/15 0534 08/10/15 0500 08/11/15 0525  WBC 20.5* 21.8* 23.4* 25.6* 25.7*  HGB 8.5* 8.1* 7.7* 7.5* 7.6*  HCT 26.6* 25.3* 25.4* 24.2* 24.1*  MCV 85.0 86.1 85.8 85.2 86.7  PLT 888* 806* 756* 766* 768*    Recent Results (from the past 240 hour(s))  Anaerobic culture     Status: None   Collection Time: 08/04/15  7:08 PM  Result Value Ref Range Status   Specimen Description TISSUE LEFT  SACRAL  Final   Special Requests NONE  Final   Gram Stain   Final    NO WBC SEEN NO ORGANISMS SEEN Performed at [facility]    Culture   Final    NO ANAEROBES ISOLATED Performed at [facility]    Report Status 08/09/2015 FINAL  Final  Fungus Culture with Smear     Status: None (Preliminary result)   Collection Time: 08/04/15  7:11 PM  Result Value Ref Range Status   Specimen Description TISSUE LEFT SACRAL  Final   Special Requests NONE  Final   Fungal Smear   Final    NO YEAST OR FUNGAL ELEMENTS SEEN Performed at [facility]    Culture   Final    YEAST ISOLATED;ID TO FOLLOW Performed at [facility]    Report Status PENDING  Incomplete  AFB culture with smear     Status: None (Preliminary result)   Collection Time: 08/04/15  7:13 PM  Result Value Ref Range Status   Specimen Description TISSUE LEFT SACRAL  Final   Special Requests NONE  Final   Acid Fast Smear   Final    NO ACID FAST BACILLI SEEN Performed at [facility]    Culture   Final    CULTURE WILL BE EXAMINED FOR 6 WEEKS BEFORE ISSUING A FINAL REPORT Performed at [facility]    Report Status PENDING  Incomplete  Tissue culture     Status: None   Collection Time: 08/04/15  7:18 PM  Result Value Ref Range Status   Specimen Description TISSUE LEFT SACRAL  Final   Special Requests NONE  Final   Gram Stain   Final    NO WBC SEEN NO ORGANISMS SEEN Performed at [facility]    Culture   Final    MULTIPLE ORGANISMS PRESENT, NONE PREDOMINANT Note: NO GROUP A STREP (S.PYOGENES) ISOLATED NO STAPHYLOCOCCUS AUREUS ISOLATED Performed at [facility]    Report Status 08/07/2015 FINAL  Final  Culture, Urine     Status: None (Preliminary result)   Collection Time: 08/10/15 10:59 AM  Result Value Ref Range Status   Specimen Description URINE, SUPRAPUBIC  Final   Special Requests NONE  Final   Culture TOO YOUNG TO READ  Final   Report Status PENDING  Incomplete     Studies: No results found.  Scheduled Meds: . baclofen  10 mg Oral 2 times per day  . baclofen  20 mg Oral QHS  . colistimethate (COLISTIN) IV  75 mg Intravenous Q12H  . collagenase   Topical Daily  . enoxaparin (LOVENOX) injection  40 mg Subcutaneous Daily  . feeding supplement (VITAL 1.0 CAL)  720 mL Per Tube q1800  . fentaNYL  50 mcg Transdermal Q72H  . Influenza vac split quadrivalent PF  0.5 mL Intramuscular Tomorrow-1000  . insulin aspart  0-15 Units Subcutaneous TID WC  . insulin aspart  0-5 Units Subcutaneous QHS  . insulin aspart  5 Units Subcutaneous TID WC  . insulin glargine  18 Units Subcutaneous Daily  . magnesium chloride  1 tablet Oral BID  . midodrine  30 mg Oral TID WC  . multivitamin with minerals  1 tablet Oral Daily  . pantoprazole sodium  40 mg Oral Daily  . pregabalin  100 mg Oral 3 times per day  . pregabalin  200 mg Oral QHS  . sodium chloride  10-40 mL Intracatheter Q12H   Continuous Infusions: .  sodium chloride 10 mL/hr at 08/10/15 2315  . lactated ringers 10 mL/hr at 08/04/15 1721    Principal Problem:   Acute encephalopathy Active  Problems:   Palliative care encounter   Chronic anemia   Uncontrolled type 1 diabetes mellitus with hyperglycemia ([name])   Altered mental status   Tracheostomy dependent ([name])   H/O spinal cord injury   Chronic paraplegia ([name])   Acute on chronic respiratory failure ([name])   Hypomagnesemia   Decubitus ulcer of right perineal ischial region, stage 4 ([name])   Decubitus ulcer of left perineal ischial region, stage 4 ([name])   Decubitus ulcer of sacral region, stage 4 ([name])   Complicated urinary tract infection   Infection with Pseudomonas aeruginosa resistant to multiple drugs   MDR Acinetobacter baumannii infection   History of MDR Pseudomonas aeruginosa infection    Time spent: 30 min    Jason [name], [facility] Pager [phone]. If 7PM-7AM, please contact night-coverage at [email or web address], password [facility] 08/11/2015, 2:24 PM  LOS: 32 days

## 2015-08-12 DIAGNOSIS — G8250 Quadriplegia, unspecified: Secondary | ICD-10-CM

## 2015-08-12 LAB — CBC
HEMATOCRIT: 23.3 % — AB (ref 39.0–52.0)
HEMOGLOBIN: 7.1 g/dL — AB (ref 13.0–17.0)
MCH: 25.8 pg — ABNORMAL LOW (ref 26.0–34.0)
MCHC: 30.5 g/dL (ref 30.0–36.0)
MCV: 84.7 fL (ref 78.0–100.0)
Platelets: 773 10*3/uL — ABNORMAL HIGH (ref 150–400)
RBC: 2.75 MIL/uL — AB (ref 4.22–5.81)
RDW: 15.1 % (ref 11.5–15.5)
WBC: 28.4 10*3/uL — AB (ref 4.0–10.5)

## 2015-08-12 LAB — GLUCOSE, CAPILLARY
GLUCOSE-CAPILLARY: 165 mg/dL — AB (ref 65–99)
GLUCOSE-CAPILLARY: 146 mg/dL — AB (ref 65–99)
GLUCOSE-CAPILLARY: 101 mg/dL — AB (ref 65–99)
Glucose-Capillary: 300 mg/dL — ABNORMAL HIGH (ref 65–99)
Glucose-Capillary: 232 mg/dL — ABNORMAL HIGH (ref 65–99)
Glucose-Capillary: 156 mg/dL — ABNORMAL HIGH (ref 65–99)

## 2015-08-12 LAB — BASIC METABOLIC PANEL
ANION GAP: 7 (ref 5–15)
BUN: 9 mg/dL (ref 6–20)
CO2: 25 mmol/L (ref 22–32)
Calcium: 8.7 mg/dL — ABNORMAL LOW (ref 8.9–10.3)
Chloride: 98 mmol/L — ABNORMAL LOW (ref 101–111)
Creatinine, Ser: 0.69 mg/dL (ref 0.61–1.24)
GFR calc Af Amer: >60 mL/min (ref >60)
Glucose, Bld: 271 mg/dL — ABNORMAL HIGH (ref 65–99)
POTASSIUM: 5.1 mmol/L (ref 3.5–5.1)
SODIUM: 130 mmol/L — AB (ref 135–145)

## 2015-08-12 LAB — URINE CULTURE

## 2015-08-12 LAB — PREPARE RBC (CROSSMATCH)

## 2015-08-12 MED ORDER — SODIUM CHLORIDE 0.9 % IV SOLN
Freq: Once | INTRAVENOUS | Status: AC
  Administered 2015-08-12: 13:00:00 via INTRAVENOUS

## 2015-08-12 MED ORDER — CALCIUM CARBONATE ANTACID 500 MG PO CHEW
1.0000 | CHEWABLE_TABLET | Freq: Four times a day (QID) | ORAL | Status: DC | PRN
  Administered 2015-08-12 – 2015-08-13 (×2): 200 mg via ORAL
  Filled 2015-08-12 (×2): qty 1

## 2015-08-12 MED ORDER — SACCHAROMYCES BOULARDII 250 MG PO CAPS
250.0000 mg | ORAL_CAPSULE | Freq: Two times a day (BID) | ORAL | Status: DC
  Administered 2015-08-12 – 2015-08-16 (×9): 250 mg via ORAL
  Filled 2015-08-12 (×9): qty 1

## 2015-08-12 NOTE — Progress Notes (Signed)
[facility] PROGRESS NOTE  Jason [name] GOV:[id number] DOB: 02-17-1986 DOA: 07/10/2015 PCP: [name], MD  Brief Summary  Jason [name] is a 29 y.o. 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  He has had numerous hospitalizations over the last few years and has become progressively sicker.  He developed pulmonary embolism requiring IVC filter placement.  In August, he underwent tracheostomy placement secondary to ARDS.  He has also had infections with Burkholderia and Acinetobacter and is closely followed by infectious disease.  He has developed extensive antibiotic resistance.  During his last admission, he met with palliative care at which time he continued to endorse a good quality of life and maintained full code status.  He was readmitted with delirium and sepsis likely from UTI secondary to his chronic suprapubic catheter and from osteomyelitis.  He was started on broad spectrum antibiotics and ID was consulted.  Due to ongoing fevers and leukocytosis, he underwent CT scan which demonstrated osteomyelitis and he underwent bone biopsy on 10/27 by Dr. Migdalia Dk.  Bone culture grew mixed flora and ID felt that this was not the source of his fevers/illness.  Repeated urine cultures grew Pseudomonas and ID recommended treating with amikacin, however, he had worsening leukocytosis and ongoing fevers.    10/17:  Sacral decubitus ulcer with prior distal sacrococcygeal destruction or resection and foci of gas within the LEFT gluteus maximus question infection. 10/27:  Bone biopsy by Dr. Migdalia Dk, culture grew multiple species 11/2:   D/c amikacin and started colistin for PSA UTI 11/4:  Spiked fever during blood  transfusion (tx 2 units PRBC)  Assessment/Plan  Toxic metabolic encephalopathy, occurs intermittently when he becomes sick.  Improved after changing to colistin -  Ongoing evaluation and management of his infections, see below -  Kidney function appears stable so doubt that this is accumulation of sedating metabolites  Sepsis due to MDR Pseudomonas Aeruginosa catheter-associated UTI present at time of admission (indwelling suprapubic catheter) and decubitus osteomyelitis - Suprapubic catheter changed twice by urology since admission - ID following -  Plastic surgery signed off - antibiotics narrowed to treat Pseudomonas in urine with amikacin, however, he continued to spike fevers and had rising WBC count. -  Continue colistin day 2  Acute renal failure due to sepsis, now resolved.  Hyponatremia  Mild, corrects toward normal considering hyperglycemia but persists probably due to osmolality of tube feeds  Hypomagnesemia: start oral magnesium supplementation  Diabetes mellitus type 1, hypoglycemic yesterda -  Continue lantus 18 units -  D/c standing aspart -  Continue SSI aspart with meals  Sacral decubitus and left flank decubitus - S/p bone biopsy, acell  Chronic anemia, hemoglobin gradually trending down.  Previous labs consistent with iron deficiency anemia vs. Anemia of chronic inflammation.  B12, folate, and TSH were wnl. -  Transfuse 2 units PRBC today  Chronic respiratory failure with trach and vent - Patient took himself off the vent 10/7 and currently stable on trach collar  - CCM M.D. on call 10/16: Agrees with outpatient follow-up with trach clinic and indicates that he may not be decannulated.   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 -  IVC filter placed 8/6 -  Not on A/C due to need for frequent debridements which have caused extensive bleeding,  previously on apixaban  History of spinal cord infarct with paraplegia, partial quadriplegia  Severe malnutrition in context of  chronic illness -  On night time TF  Diet:  Regular with tube feeds Access:  PIV IVF:  off Proph:  lovenox  Code Status: full Family Communication: patient and his mother Disposition Plan:  Possibly home with PICC line and IV antibiotics as early as tomorrow if WBC trending down, temperature trending down, maintaining mentation.  Consultants:  PCCM   Infectious disease  Plastics- [facility]  Procedures:  10/27:  Bone biopsy  Antibiotics:  Vancomycin 10/2 > 11/1  Imipenem 10/2 > 10/11  Aztreonam 10/31 > 11/1  Flagyl 10/31 > 11/1  Amikacin 11/1 > 11/2  Colisthimethate 11/2 >    HPI/Subjective:  Had episode of hypoglycemia yesterday.  Had low grade fever overnight.  Denies changes in breathing, cough, vomiting, more frequent or more watery stools, abdominal pain.   RN reports that ulcers look stable.  Has some muscle pain of the right neck.    Objective: Filed Vitals:   08/12/15 1320 08/12/15 1446 08/12/15 1508 08/12/15 1517  BP: 131/73 170/101  159/92  Pulse: 90 94 93 91  Temp: 101.3 F (38.5 C) 100.6 F (38.1 C)  99.8 F (37.7 C)  TempSrc: Oral Oral  Oral  Resp: 20 20 18 19  Height:      Weight:      SpO2: 100% 100% 100% 100%    Intake/Output Summary (Last 24 hours) at 08/12/15 1543 Last data filed at 08/12/15 1500  Gross per 24 hour  Intake 2522.67 ml  Output   2150 ml  Net 372.67 ml   Filed Weights   08/10/15 0649 08/11/15 0439 08/12/15 0500  Weight: 55.8 kg (123 lb 0.3 oz) 61.19 kg (134 lb 14.4 oz) 58.9 kg (129 lb 13.6 oz)   Body mass index is 20.33 kg/(m^2).  Exam:   General:  Thin male, awake, alert, No acute distress  HEENT:  NCAT, MMM  Cardiovascular:  RRR, nl S1, S2 no mrg, 2+ pulses, warm extremities  Respiratory:  CTAB, no increased WOB  Abdomen:   NABS, soft, NT/ND.  Ostomy bag full with light brown liquid stool.  Suprapubic catheter with clear yellow urine, no purulence or discharge.  Gastrostomy tube site c/d/i.    MSK:    Contractures of hands, unable to move legs with decreased muscle bulk   Skin:  Did not observe ulcers today.  Refer to progress note from 11/2.  Data Reviewed: Basic Metabolic Panel:  Recent Labs Lab 08/07/15 0650  08/08/15 0510 08/09/15 0534 08/10/15 0500 08/11/15 0525 08/12/15 0522  NA 134*  --  134* 129* 132* 132* 130*  K 6.2*  < > 5.1 5.2* 4.7 4.6 5.1  CL 101  --  98* 97* 97* 101 98*  CO2 21*  --  26 25 28 26 25  GLUCOSE 64*  --  280* 383* 170* 161* 271*  BUN 10  --  10 10 10 10 9  CREATININE 0.58*  --  0.61 0.60* 0.58* 0.65 0.69  CALCIUM 9.2  --  8.7* 8.6* 8.8* 8.7* 8.7*  MG 1.5*  --   --   --  1.5*  --   --   < > = values in this interval not displayed. Liver Function Tests: No results for input(s): AST, ALT, ALKPHOS, BILITOT, PROT, ALBUMIN in the last 168 hours. No results for input(s): LIPASE, AMYLASE in the last 168 hours. No results for   input(s): AMMONIA in the last 168 hours. CBC:  Recent Labs Lab 08/08/15 0510 08/09/15 0534 08/10/15 0500 08/11/15 0525 08/12/15 0522  WBC 21.8* 23.4* 25.6* 25.7* 28.4*  HGB 8.1* 7.7* 7.5* 7.6* 7.1*  HCT 25.3* 25.4* 24.2* 24.1* 23.3*  MCV 86.1 85.8 85.2 86.7 84.7  PLT 806* 756* 766* 768* 773*    Recent Results (from the past 240 hour(s))  Anaerobic culture     Status: None   Collection Time: 08/04/15  7:08 PM  Result Value Ref Range Status   Specimen Description TISSUE LEFT SACRAL  Final   Special Requests NONE  Final   Gram Stain   Final    NO WBC SEEN NO ORGANISMS SEEN Performed at [facility]    Culture   Final    NO ANAEROBES ISOLATED Performed at [facility]    Report Status 08/09/2015 FINAL  Final  Fungus Culture with Smear     Status: None (Preliminary result)   Collection Time: 08/04/15  7:11 PM  Result Value Ref Range Status   Specimen Description TISSUE LEFT SACRAL  Final   Special Requests NONE  Final   Fungal Smear   Final    NO YEAST OR FUNGAL ELEMENTS SEEN Performed at [facility]    Culture   Final    CANDIDA ALBICANS Performed at [facility]    Report Status PENDING  Incomplete  AFB culture with smear     Status: None (Preliminary result)   Collection Time: 08/04/15  7:13 PM  Result Value Ref Range Status   Specimen Description TISSUE LEFT SACRAL  Final   Special Requests NONE  Final   Acid Fast Smear   Final    NO ACID FAST BACILLI SEEN Performed at [facility]    Culture   Final    CULTURE WILL BE EXAMINED FOR 6 WEEKS BEFORE ISSUING A FINAL REPORT Performed at [facility]    Report Status PENDING  Incomplete  Tissue culture     Status: None   Collection Time: 08/04/15  7:18 PM  Result Value Ref Range Status   Specimen Description TISSUE LEFT SACRAL  Final   Special Requests NONE  Final   Gram Stain   Final    NO WBC SEEN NO ORGANISMS SEEN Performed at [facility]    Culture   Final    MULTIPLE ORGANISMS PRESENT, NONE PREDOMINANT Note: NO GROUP A STREP (S.PYOGENES) ISOLATED NO STAPHYLOCOCCUS AUREUS ISOLATED Performed at [facility]    Report Status 08/07/2015 FINAL  Final  Culture, Urine     Status: None   Collection Time: 08/10/15 10:59 AM  Result Value Ref Range Status   Specimen Description URINE, SUPRAPUBIC  Final   Special Requests NONE  Final   Culture MULTIPLE SPECIES PRESENT, SUGGEST RECOLLECTION  Final   Report Status 08/12/2015 FINAL  Final     Studies: No results found.  Scheduled Meds: . baclofen  10 mg Oral 2 times per day  . baclofen  20 mg Oral QHS  . colistimethate (COLISTIN) IV  75 mg Intravenous Q12H  . collagenase   Topical Daily  . enoxaparin (LOVENOX) injection  40 mg Subcutaneous Daily  . feeding supplement (VITAL 1.0 CAL)  720 mL Per Tube q1800  . fentaNYL  50 mcg Transdermal Q72H  . Influenza vac split quadrivalent PF  0.5 mL Intramuscular Tomorrow-1000  . insulin aspart  0-15 Units Subcutaneous TID WC  . insulin  glargine  18 Units Subcutaneous Daily   . magnesium chloride  1 tablet Oral BID  . midodrine  30 mg Oral TID WC  . multivitamin with minerals  1 tablet Oral Daily  . pantoprazole sodium  40 mg Oral Daily  . pregabalin  100 mg Oral 3 times per day  . pregabalin  200 mg Oral QHS  . saccharomyces boulardii  250 mg Oral BID  . sodium chloride  10-40 mL Intracatheter Q12H   Continuous Infusions: . sodium chloride 10 mL/hr at 08/10/15 2315  . lactated ringers 10 mL/hr at 08/04/15 1721    Principal Problem:   Acute encephalopathy Active Problems:   Palliative care encounter   Chronic anemia   Uncontrolled type 1 diabetes mellitus with hyperglycemia ([name])   Altered mental status   Tracheostomy dependent ([name])   H/O spinal cord injury   Chronic paraplegia ([name])   Acute on chronic respiratory failure ([name])   Hypomagnesemia   Decubitus ulcer of right perineal ischial region, stage 4 ([name])   Decubitus ulcer of left perineal ischial region, stage 4 ([name])   Decubitus ulcer of sacral region, stage 4 ([name])   Complicated urinary tract infection   Infection with Pseudomonas aeruginosa resistant to multiple drugs   MDR Acinetobacter baumannii infection   History of MDR Pseudomonas aeruginosa infection    Time spent: 30 min    Jason [name], [facility] Pager [phone]. If 7PM-7AM, please contact night-coverage at [email or web address], password [facility] 08/12/2015, 3:43 PM  LOS: 33 days

## 2015-08-12 NOTE — Progress Notes (Signed)
Pt spiked a temp (101.3) during blood transfusion. Dr. Sheran Fava made aware and instructed RN to giveTylenol and continue blood transfusion. Will continue to monitor pt vital signs closely.

## 2015-08-12 NOTE — Progress Notes (Addendum)
[place] [facility]ious [facility]   Reason for visit: Follow up on fever, complicated UTI  Interval History: he just had a fever to 101 associated with blood transfusion.  No new symptoms. No SOB, no dypsnea.  Eating.  On colistin, creat stable at 0.69.  No increased colostomy output, not watery. Leukocytosis continues to rise.    Physical Exam: Constitutional:  Filed Vitals:   08/12/15 1320  BP: 131/73  Pulse: 90  Temp: 101.3 F (38.5 C)  Resp: 20   patient appears in NAD Eyes: anicteric HENT: trach in place Respiratory: Normal respiratory effort; CTA B Cardiovascular: RRR GI: colostomy in place, empty  Review of Systems: Constitutional: negative for chills Respiratory: negative for cough or sputum  Lab Results  Component Value Date   WBC 28.4* 08/12/2015   HGB 7.1* 08/12/2015   HCT 23.3* 08/12/2015   MCV 84.7 08/12/2015   PLT 773* 08/12/2015    Lab Results  Component Value Date   CREATININE 0.69 08/12/2015   BUN 9 08/12/2015   NA 130* 08/12/2015   K 5.1 08/12/2015   CL 98* 08/12/2015   CO2 25 08/12/2015    Lab Results  Component Value Date   ALT 53 07/25/2015   AST 60* 07/25/2015   ALKPHOS 135* 07/25/2015     Microbiology: Recent Results (from the past 240 hour(s))  Anaerobic culture     Status: None   Collection Time: 08/04/15  7:08 PM  Result Value Ref Range Status   Specimen Description TISSUE LEFT SACRAL  Final   Special Requests NONE  Final   Gram Stain   Final    NO WBC SEEN NO ORGANISMS SEEN Performed at [facility]    Culture   Final    NO ANAEROBES ISOLATED Performed at [facility]    Report Status 08/09/2015 FINAL  Final  Fungus Culture with Smear     Status: None (Preliminary result)   Collection Time: 08/04/15  7:11 PM  Result Value Ref Range Status   Specimen Description TISSUE LEFT SACRAL  Final   Special Requests NONE  Final   Fungal Smear   Final    NO YEAST OR FUNGAL ELEMENTS SEEN Performed at  [facility]    Culture   Final    CANDIDA ALBICANS Performed at [facility]    Report Status PENDING  Incomplete  AFB culture with smear     Status: None (Preliminary result)   Collection Time: 08/04/15  7:13 PM  Result Value Ref Range Status   Specimen Description TISSUE LEFT SACRAL  Final   Special Requests NONE  Final   Acid Fast Smear   Final    NO ACID FAST BACILLI SEEN Performed at [facility]    Culture   Final    CULTURE WILL BE EXAMINED FOR 6 WEEKS BEFORE ISSUING A FINAL REPORT Performed at [facility]    Report Status PENDING  Incomplete  Tissue culture     Status: None   Collection Time: 08/04/15  7:18 PM  Result Value Ref Range Status   Specimen Description TISSUE LEFT SACRAL  Final   Special Requests NONE  Final   Gram Stain   Final    NO WBC SEEN NO ORGANISMS SEEN Performed at [facility]    Culture   Final    MULTIPLE ORGANISMS PRESENT, NONE PREDOMINANT Note: NO GROUP A STREP (S.PYOGENES) ISOLATED NO STAPHYLOCOCCUS AUREUS ISOLATED Performed at [facility]    Report Status 08/07/2015 FINAL  Final  Culture, Urine     Status: None   Collection Time: 08/10/15 10:59 AM  Result Value Ref Range Status   Specimen Description URINE, SUPRAPUBIC  Final   Special Requests NONE  Final   Culture MULTIPLE SPECIES PRESENT, SUGGEST RECOLLECTION  Final   Report Status 08/12/2015 FINAL  Final    Impression:  1. Fever of unknown etiology 2. Complicated UTI from suprapubic catheter on colistin 3. MDRO 4. quadriplegia  Plan: 1. Continue with colistin 2. Daily creat monitory 3. Blood cultures with fever  Dr. Megan Salon will follow up over the weekend

## 2015-08-12 NOTE — Progress Notes (Signed)
PT Cancellation Note  Patient Details Name: Jason [name] MRN: [id number] DOB: 12-13-1985   Cancelled Treatment:    Reason Eval/Treat Not Completed: Patient not medically ready;Medical issues which prohibited therapy;Other (comment) (Nursing transfusing and poor lab values ).   Talked with nursing about platelets and anemia, will try again tomorrow.   [name] 08/12/2015, 11:50 AM   [name], PT MS Acute Rehab Dept. Number: [facility] [id number] and [name] [phone]

## 2015-08-13 DIAGNOSIS — Z9889 Other specified postprocedural states: Secondary | ICD-10-CM

## 2015-08-13 DIAGNOSIS — R8290 Unspecified abnormal findings in urine: Secondary | ICD-10-CM

## 2015-08-13 DIAGNOSIS — A499 Bacterial infection, unspecified: Secondary | ICD-10-CM

## 2015-08-13 LAB — TYPE AND SCREEN
ABO/RH(D): O POS
ANTIBODY SCREEN: NEGATIVE
UNIT DIVISION: 0
UNIT DIVISION: 0

## 2015-08-13 LAB — DIFFERENTIAL
BASOS PCT: 0 %
Basophils Absolute: 0.1 10*3/uL (ref 0.0–0.1)
EOS PCT: 3 %
Eosinophils Absolute: 0.8 10*3/uL — ABNORMAL HIGH (ref 0.0–0.7)
LYMPHS ABS: 3.5 10*3/uL (ref 0.7–4.0)
LYMPHS PCT: 12 %
Monocytes Absolute: 3.3 10*3/uL — ABNORMAL HIGH (ref 0.1–1.0)
Monocytes Relative: 12 %
NEUTROS ABS: 20.7 10*3/uL — AB (ref 1.7–7.7)
NEUTROS PCT: 73 %

## 2015-08-13 LAB — CBC
HCT: 30.3 % — ABNORMAL LOW (ref 39.0–52.0)
HEMOGLOBIN: 9.8 g/dL — AB (ref 13.0–17.0)
MCH: 26.9 pg (ref 26.0–34.0)
MCHC: 32.3 g/dL (ref 30.0–36.0)
MCV: 83.2 fL (ref 78.0–100.0)
PLATELETS: 824 10*3/uL — AB (ref 150–400)
RBC: 3.64 MIL/uL — AB (ref 4.22–5.81)
RDW: 15.1 % (ref 11.5–15.5)
WBC: 29.3 10*3/uL — AB (ref 4.0–10.5)

## 2015-08-13 LAB — BASIC METABOLIC PANEL
ANION GAP: 8 (ref 5–15)
BUN: 10 mg/dL (ref 6–20)
CHLORIDE: 100 mmol/L — AB (ref 101–111)
CO2: 25 mmol/L (ref 22–32)
CREATININE: 0.76 mg/dL (ref 0.61–1.24)
Calcium: 9.1 mg/dL (ref 8.9–10.3)
GFR calc non Af Amer: >60 mL/min (ref >60)
Glucose, Bld: 285 mg/dL — ABNORMAL HIGH (ref 65–99)
POTASSIUM: 5.1 mmol/L (ref 3.5–5.1)
SODIUM: 133 mmol/L — AB (ref 135–145)

## 2015-08-13 LAB — GLUCOSE, CAPILLARY
GLUCOSE-CAPILLARY: 281 mg/dL — AB (ref 65–99)
GLUCOSE-CAPILLARY: 270 mg/dL — AB (ref 65–99)
GLUCOSE-CAPILLARY: 208 mg/dL — AB (ref 65–99)
Glucose-Capillary: 110 mg/dL — ABNORMAL HIGH (ref 65–99)
Glucose-Capillary: 43 mg/dL — CL (ref 65–99)
Glucose-Capillary: 155 mg/dL — ABNORMAL HIGH (ref 65–99)

## 2015-08-13 MED ORDER — INSULIN ASPART 100 UNIT/ML ~~LOC~~ SOLN
0.0000 [IU] | Freq: Three times a day (TID) | SUBCUTANEOUS | Status: DC
  Administered 2015-08-14: 7 [IU] via SUBCUTANEOUS
  Administered 2015-08-14 (×2): 3 [IU] via SUBCUTANEOUS
  Administered 2015-08-15: 9 [IU] via SUBCUTANEOUS
  Administered 2015-08-15: 7 [IU] via SUBCUTANEOUS

## 2015-08-13 MED ORDER — IBUPROFEN 600 MG PO TABS
600.0000 mg | ORAL_TABLET | ORAL | Status: DC | PRN
  Administered 2015-08-13 – 2015-08-15 (×6): 600 mg via ORAL
  Filled 2015-08-13 (×4): qty 1

## 2015-08-13 MED ORDER — INSULIN ASPART 100 UNIT/ML ~~LOC~~ SOLN
0.0000 [IU] | Freq: Every day | SUBCUTANEOUS | Status: DC
  Administered 2015-08-14: 5 [IU] via SUBCUTANEOUS

## 2015-08-13 NOTE — Progress Notes (Signed)
[facility] PROGRESS NOTE  Jason [name] YIR:[id number] DOB: 20-Dec-1985 DOA: 07/10/2015 PCP: [name], MD  Brief Summary  Jason [name] is a 29 y.o. 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  He has had numerous hospitalizations over the last few years and has become progressively sicker.  He developed pulmonary embolism requiring IVC filter placement.  In August, he underwent tracheostomy placement secondary to ARDS.  He has also had infections with Burkholderia and Acinetobacter and is closely followed by infectious disease.  He has developed extensive antibiotic resistance.  During his last admission, he met with palliative care at which time he continued to endorse a good quality of life and maintained full code status.  He was readmitted with delirium and sepsis likely from UTI secondary to his chronic suprapubic catheter and from osteomyelitis.  He was started on broad spectrum antibiotics and ID was consulted.  Due to ongoing fevers and leukocytosis, he underwent CT scan which demonstrated osteomyelitis and he underwent bone biopsy on 10/27 by Dr. Migdalia Dk.  Bone culture grew mixed flora and ID felt that this was not the source of his fevers/illness.  Repeated urine cultures grew Pseudomonas and ID recommended treating with amikacin, however, he had worsening leukocytosis and ongoing fevers.    10/17:  Sacral decubitus ulcer with prior distal sacrococcygeal destruction or resection and foci of gas within the LEFT gluteus maximus question infection. 10/27:  Bone biopsy by Dr. Migdalia Dk, culture grew multiple species 11/2:   D/c amikacin and started colistin for PSA UTI 11/4:  Spiked fever during blood  transfusion (tx 2 units PRBC) 11/5:  Fevers and WBC rising.    Assessment/Plan  Toxic metabolic encephalopathy, occurs intermittently when he becomes sick.  Improved after changing to colistin -  Ongoing evaluation and management of his infections, see below -  Kidney function appears stable so doubt that this is accumulation of sedating metabolites  Sepsis due to MDR Pseudomonas Aeruginosa catheter-associated UTI present at time of admission (indwelling suprapubic catheter) and decubitus osteomyelitis - Suprapubic catheter changed twice by urology since admission - ID following -  Plastic surgery signed off - antibiotics narrowed to treat Pseudomonas in urine with amikacin, however, he continued to spike fevers and had rising WBC count. -  Wounds have a mildly sweet odor today, no other new signs of infection -  Colistin does not appear to be working   Acute renal failure due to sepsis, now resolved.  Creatinine starting to rise slightly  Hyponatremia  Mild, corrects toward normal considering hyperglycemia but persists probably due to osmolality of tube feeds  Hypomagnesemia: start oral magnesium supplementation  Diabetes mellitus type 1, mild hyperglycemia -  Continue lantus 18 units -  Continue SSI aspart with meals  Sacral decubitus and left flank decubitus - S/p bone biopsy, acell  Chronic anemia, hemoglobin gradually trending down.  Previous labs consistent with iron deficiency anemia vs. Anemia of chronic inflammation.  B12, folate, and TSH were wnl. -  hgb responded to 2 units PRBC yesterday  Chronic respiratory failure with trach and vent - Patient took himself off the vent 10/7 and currently stable on trach collar  - CCM M.D. on call 10/16: Agrees with outpatient follow-up with trach clinic and indicates that he may not  be decannulated.   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 -  IVC filter placed 8/6 -  Not on A/C due to need for frequent debridements which have caused  extensive bleeding, previously on apixaban -  Consider resumption of anticoagulation as perhaps VTE are contributing to fevers  History of spinal cord infarct with paraplegia, partial quadriplegia  Severe malnutrition in context of chronic illness -  On night time TF  Diet:  Regular with tube feeds Access:  PIV IVF:  off Proph:  lovenox  Code Status: full Family Communication: patient and his mother Disposition Plan:  Awaiting improvement in fevers, leukocytosis.  Having ongoing conversations with mother and patient about not responding to last-line antibiotics.     Consultants:  PCCM   Infectious disease  Plastics- [facility]  Procedures:  10/27:  Bone biopsy  Antibiotics:  Vancomycin 10/2 > 11/1  Imipenem 10/2 > 10/11  Aztreonam 10/31 > 11/1  Flagyl 10/31 > 11/1  Amikacin 11/1 > 11/2  Colisthimethate 11/2 >    HPI/Subjective:  Worsening leukocytosis.  Spiking higher fevers today.  Had some teeth that were a little sore a few days ago which are now better.  No changes in ROS.  Stools are the same, no new respiratory symptoms, sore throat, abdominal pain.   Objective: Filed Vitals:   08/12/15 2119 08/13/15 0303 08/13/15 0550 08/13/15 0656  BP: 140/88  119/77   Pulse: 87  100   Temp: 99.6 F (37.6 C) 101.3 F (38.5 C) 102.6 F (39.2 C) 102.3 F (39.1 C)  TempSrc: Oral Oral Oral Oral  Resp: 18  19   Height:      Weight:   60.328 kg (133 lb)   SpO2: 100%  100%     Intake/Output Summary (Last 24 hours) at 08/13/15 0842 Last data filed at 08/13/15 0553  Gross per 24 hour  Intake 1285.67 ml  Output   3850 ml  Net -2564.33 ml   Filed Weights   08/11/15 0439 08/12/15 0500 08/13/15 0550  Weight: 61.19 kg (134 lb 14.4 oz) 58.9 kg (129 lb 13.6 oz) 60.328 kg (133 lb)   Body mass index is 20.83 kg/(m^2).  Exam:   General:  Thin male, awake, alert, No acute distress  HEENT:  NCAT, MMM  Cardiovascular:  RRR, nl S1, S2 no mrg, 2+ pulses, warm  extremities  Respiratory:  CTAB, no increased WOB  Abdomen:   NABS, soft, NT/ND.  Ostomy bag full with light brown liquid stool.  Suprapubic catheter with clear yellow urine, no purulence or discharge.  Gastrostomy tube site c/d/i.    MSK:   Contractures of hands, unable to move legs with decreased muscle bulk   Skin:  Stage 2 ulcer on mid back without evidence of infection, healthy bleeding granulation tissue. Buttock ulcer smells a little sweet today and is beefier red.  No purulence or indurated margins. Left foot: Heel and left lateral foot with stage 4 ulcers.  Distal portion of plantar heel ulcer is soft/yellow with a faint odor.  Data Reviewed: Basic Metabolic Panel:  Recent Labs Lab 08/07/15 0650  08/09/15 0534 08/10/15 0500 08/11/15 0525 08/12/15 0522 08/13/15 0435  NA 134*  < > 129* 132* 132* 130* 133*  K 6.2*  < > 5.2* 4.7 4.6 5.1 5.1  CL 101  < > 97* 97* 101 98* 100*  CO2 21*  < > _0 GLUCOSE 64*  < > 383* 170* 161* 271* 285*  BUN 10  < >  _0 CREATININE 0.58*  < > 0.60* 0.58* 0.65 0.69 0.76  CALCIUM 9.2  < > 8.6* 8.8* 8.7* 8.7* 9.1  MG 1.5*  --   --  1.5*  --   --   --   < > = values in this interval not displayed. Liver Function Tests: No results for input(s): AST, ALT, ALKPHOS, BILITOT, PROT, ALBUMIN in the last 168 hours. No results for input(s): LIPASE, AMYLASE in the last 168 hours. No results for input(s): AMMONIA in the last 168 hours. CBC:  Recent Labs Lab 08/09/15 0534 08/10/15 0500 08/11/15 0525 08/12/15 0522 08/13/15 0435  WBC 23.4* 25.6* 25.7* 28.4* 29.3*  HGB 7.7* 7.5* 7.6* 7.1* 9.8*  HCT 25.4* 24.2* 24.1* 23.3* 30.3*  MCV 85.8 85.2 86.7 84.7 83.2  PLT 756* 766* 768* 773* 824*    Recent Results (from the past 240 hour(s))  Anaerobic culture     Status: None   Collection Time: 08/04/15  7:08 PM  Result Value Ref Range Status   Specimen Description TISSUE LEFT SACRAL  Final   Special Requests NONE  Final   Gram  Stain   Final    NO WBC SEEN NO ORGANISMS SEEN Performed at [facility]    Culture   Final    NO ANAEROBES ISOLATED Performed at [facility]    Report Status 08/09/2015 FINAL  Final  Fungus Culture with Smear     Status: None (Preliminary result)   Collection Time: 08/04/15  7:11 PM  Result Value Ref Range Status   Specimen Description TISSUE LEFT SACRAL  Final   Special Requests NONE  Final   Fungal Smear   Final    NO YEAST OR FUNGAL ELEMENTS SEEN Performed at [facility]    Culture   Final    CANDIDA ALBICANS Performed at [facility]    Report Status PENDING  Incomplete  AFB culture with smear     Status: None (Preliminary result)   Collection Time: 08/04/15  7:13 PM  Result Value Ref Range Status   Specimen Description TISSUE LEFT SACRAL  Final   Special Requests NONE  Final   Acid Fast Smear   Final    NO ACID FAST BACILLI SEEN Performed at [facility]    Culture   Final    CULTURE WILL BE EXAMINED FOR 6 WEEKS BEFORE ISSUING A FINAL REPORT Performed at [facility]    Report Status PENDING  Incomplete  Tissue culture     Status: None   Collection Time: 08/04/15  7:18 PM  Result Value Ref Range Status   Specimen Description TISSUE LEFT SACRAL  Final   Special Requests NONE  Final   Gram Stain   Final    NO WBC SEEN NO ORGANISMS SEEN Performed at [facility]    Culture   Final    MULTIPLE ORGANISMS PRESENT, NONE PREDOMINANT Note: NO GROUP A STREP (S.PYOGENES) ISOLATED NO STAPHYLOCOCCUS AUREUS ISOLATED Performed at [facility]    Report Status 08/07/2015 FINAL  Final  Culture, Urine     Status: None   Collection Time: 08/10/15 10:59 AM  Result Value Ref Range Status   Specimen Description URINE, SUPRAPUBIC  Final   Special Requests NONE  Final   Culture MULTIPLE SPECIES PRESENT, SUGGEST RECOLLECTION  Final   Report Status 08/12/2015 FINAL  Final     Studies: No results  found.  Scheduled Meds: . baclofen  10 mg Oral 2 times per day  . baclofen  20 mg Oral QHS  . colistimethate (COLISTIN) IV  75 mg Intravenous Q12H  . collagenase   Topical Daily  . enoxaparin (LOVENOX) injection  40 mg Subcutaneous Daily  . feeding supplement (VITAL 1.0 CAL)  720 mL Per Tube q1800  . fentaNYL  50 mcg Transdermal Q72H  . Influenza vac split quadrivalent PF  0.5 mL Intramuscular Tomorrow-1000  . insulin aspart  0-15 Units Subcutaneous TID WC  . insulin glargine  18 Units Subcutaneous Daily  . magnesium chloride  1 tablet Oral BID  . midodrine  30 mg Oral TID WC  . multivitamin with minerals  1 tablet Oral Daily  . pantoprazole sodium  40 mg Oral Daily  . pregabalin  100 mg Oral 3 times per day  . pregabalin  200 mg Oral QHS  . saccharomyces boulardii  250 mg Oral BID  . sodium chloride  10-40 mL Intracatheter Q12H   Continuous Infusions: . sodium chloride 10 mL/hr at 08/10/15 2315  . lactated ringers 10 mL/hr at 08/04/15 1721    Principal Problem:   Acute encephalopathy Active Problems:   Palliative care encounter   Chronic anemia   Uncontrolled type 1 diabetes mellitus with hyperglycemia ([name])   Altered mental status   Tracheostomy dependent ([name])   H/O spinal cord injury   Chronic paraplegia ([name])   Acute on chronic respiratory failure ([name])   Hypomagnesemia   Decubitus ulcer of right perineal ischial region, stage 4 ([name])   Decubitus ulcer of left perineal ischial region, stage 4 ([name])   Decubitus ulcer of sacral region, stage 4 ([name])   Complicated urinary tract infection   Infection with Pseudomonas aeruginosa resistant to multiple drugs   MDR Acinetobacter baumannii infection   History of MDR Pseudomonas aeruginosa infection    Time spent: 30 min    , [facility] Pager [phone]. If 7PM-7AM, please contact night-coverage at [email or web address], password [facility] 08/13/2015, 8:42 AM  LOS: 34 days

## 2015-08-13 NOTE — Progress Notes (Signed)
PT Screen  I screened pt. For appropriateness for acute PT services.  Pt. currently with stage 4 sacral ulcers and is on a proventa air bed.  Pt. is in the sitting position in the bed.  He has a power chair with a roho cushion at home and has an adapted [employer].  Reports that a component of the Lucianne Lei is not working currently and does not believe his chair could be brought to the hospital.  If this is confirmed, he will likely need ambulance transport home.  Pt. states he has a first and third shift nurse and his mother cares for him on second shift.  He has a clinitron bed and hoyer lift at home.  I do not see that acute PT services will benefit him currently.  I would recommend against him being up in a recliner chair given his wound issues, and would only recommend out of bed when he returns home to his power chair with roho cushion, and for very limited amounts of time.    I discussed with pt. and he is in agreement.  I will not proceed with PT evaluation at this time.  Please call if questions.    Gerlean Ren PT Acute Rehab Services [place] [phone]

## 2015-08-13 NOTE — Progress Notes (Signed)
Patient ID: Jason [name], male   DOB: 1986/08/21, 29 y.o.   MRN: [id number]         [facility]    Date of Admission:  07/10/2015   Prolonged antibiotic therapy        Day 5 colistin  Principal Problem:   Acute encephalopathy Active Problems:   Palliative care encounter   Chronic anemia   Uncontrolled type 1 diabetes mellitus with hyperglycemia ([name])   Altered mental status   Tracheostomy dependent ([name])   H/O spinal cord injury   Chronic paraplegia ([name])   Acute on chronic respiratory failure ([name])   Hypomagnesemia   Decubitus ulcer of right perineal ischial region, stage 4 ([name])   Decubitus ulcer of left perineal ischial region, stage 4 ([name])   Decubitus ulcer of sacral region, stage 4 ([name])   Complicated urinary tract infection   Infection with Pseudomonas aeruginosa resistant to multiple drugs   MDR Acinetobacter baumannii infection   History of MDR Pseudomonas aeruginosa infection   . baclofen  10 mg Oral 2 times per day  . baclofen  20 mg Oral QHS  . colistimethate (COLISTIN) IV  75 mg Intravenous Q12H  . collagenase   Topical Daily  . enoxaparin (LOVENOX) injection  40 mg Subcutaneous Daily  . feeding supplement (VITAL 1.0 CAL)  720 mL Per Tube q1800  . fentaNYL  50 mcg Transdermal Q72H  . Influenza vac split quadrivalent PF  0.5 mL Intramuscular Tomorrow-1000  . insulin aspart  0-15 Units Subcutaneous TID WC  . insulin glargine  18 Units Subcutaneous Daily  . magnesium chloride  1 tablet Oral BID  . midodrine  30 mg Oral TID WC  . multivitamin with minerals  1 tablet Oral Daily  . pantoprazole sodium  40 mg Oral Daily  . pregabalin  100 mg Oral 3 times per day  . pregabalin  200 mg Oral QHS  . saccharomyces boulardii  250 mg Oral BID  . sodium chloride  10-40 mL Intracatheter Q12H     Past Medical History  Diagnosis Date  . GERD (gastroesophageal reflux disease)   . Asthma   . Hx MRSA infection     on face  .  Gastroparesis   . Diabetic neuropathy ([place])   . Seizures ([place])   . Family history of anesthesia complication     Pt mother can't have epidural procedures  . Dysrhythmia   . Pneumonia   . Arthritis   . Fibromyalgia   . Stroke ([place]) 01/29/2014    spinal stroke ; "quadriplegic since"  . Type I diabetes mellitus ([place])     sees Dr. Loanne Drilling   . Syncope 02/16/2015    Social History  Substance Use Topics  . Smoking status: Former Smoker -- 1.00 packs/day for 12 years    Types: Cigars, Cigarettes    Quit date: 09/07/2014  . Smokeless tobacco: Never Used  . Alcohol Use: No    Family History  Problem Relation Age of Onset  . Diabetes Father   . Hypertension Father   . Asthma      fhx  . Hypertension      fhx  . Stroke      fhx  . Heart disease Mother    Allergies  Allergen Reactions  . Cefuroxime Axetil Anaphylaxis  . Ertapenem Other (See Comments)    Rash and confusion-->tolerated Imipenem   . Morphine And Related Other (See Comments)    Changed mental status, confusion,  headache, visual hallucination  . Penicillins Anaphylaxis and Other (See Comments)    Tolerated Imipenem; no reaction to 7 day course of amoxicillin in 2015 Has patient had a PCN reaction causing immediate rash, facial/tongue/throat swelling, SOB or lightheadedness with hypotension: Yes Has patient had a PCN reaction causing severe rash involving mucus membranes or skin necrosis: No Has patient had a PCN reaction that required hospitalization Yes Has patient had a PCN reaction occurring within the last 10 years: No If all of the above answers are "NO", then may proceed with Cephalo  . Sulfa Antibiotics Anaphylaxis, Shortness Of Breath and Other (See Comments)  . Tessalon [Benzonatate] Anaphylaxis  . Shellfish Allergy Itching and Other (See Comments)    Took benadryl to alleviate reaction  . Nsaids Other (See Comments)    Risk of bleeding  . Miripirium Rash and Other (See Comments)    Change in mental  status    OBJECTIVE: Filed Vitals:   08/13/15 0550 08/13/15 0656 08/13/15 1544 08/13/15 1653  BP: 119/77  133/85   Pulse: 100  99   Temp: 102.6 F (39.2 C) 102.3 F (39.1 C) 99.1 F (37.3 C)   TempSrc: Oral Oral Oral   Resp: 19  17   Height:      Weight: 133 lb (60.328 kg)     SpO2: 100%  100% 100%   Body mass index is 20.83 kg/(m^2).  General: He is currently being cleaned up by his nurse and nurse tech. They tell me that he is failing his colostomy bag and needing it emptied every 2 hours. He currently has stool all over his left flank Skin: No rash. IV site okay Back wounds: Currently dressed and not examined  Lab Results Lab Results  Component Value Date   WBC 29.3* 08/13/2015   HGB 9.8* 08/13/2015   HCT 30.3* 08/13/2015   MCV 83.2 08/13/2015   PLT 824* 08/13/2015    Lab Results  Component Value Date   CREATININE 0.76 08/13/2015   BUN 10 08/13/2015   NA 133* 08/13/2015   K 5.1 08/13/2015   CL 100* 08/13/2015   CO2 25 08/13/2015    Lab Results  Component Value Date   ALT 53 07/25/2015   AST 60* 07/25/2015   ALKPHOS 135* 07/25/2015   BILITOT 0.4 07/25/2015     Microbiology: Recent Results (from the past 240 hour(s))  Anaerobic culture     Status: None   Collection Time: 08/04/15  7:08 PM  Result Value Ref Range Status   Specimen Description TISSUE LEFT SACRAL  Final   Special Requests NONE  Final   Gram Stain   Final    NO WBC SEEN NO ORGANISMS SEEN Performed at [facility]    Culture   Final    NO ANAEROBES ISOLATED Performed at [facility]    Report Status 08/09/2015 FINAL  Final  Fungus Culture with Smear     Status: None (Preliminary result)   Collection Time: 08/04/15  7:11 PM  Result Value Ref Range Status   Specimen Description TISSUE LEFT SACRAL  Final   Special Requests NONE  Final   Fungal Smear   Final    NO YEAST OR FUNGAL ELEMENTS SEEN Performed at [facility]    Culture   Final    CANDIDA  ALBICANS Performed at [facility]    Report Status PENDING  Incomplete  AFB culture with smear     Status: None (Preliminary result)  Collection Time: 08/04/15  7:13 PM  Result Value Ref Range Status   Specimen Description TISSUE LEFT SACRAL  Final   Special Requests NONE  Final   Acid Fast Smear   Final    NO ACID FAST BACILLI SEEN Performed at [facility]    Culture   Final    CULTURE WILL BE EXAMINED FOR 6 WEEKS BEFORE ISSUING A FINAL REPORT Performed at [facility]    Report Status PENDING  Incomplete  Tissue culture     Status: None   Collection Time: 08/04/15  7:18 PM  Result Value Ref Range Status   Specimen Description TISSUE LEFT SACRAL  Final   Special Requests NONE  Final   Gram Stain   Final    NO WBC SEEN NO ORGANISMS SEEN Performed at [facility]    Culture   Final    MULTIPLE ORGANISMS PRESENT, NONE PREDOMINANT Note: NO GROUP A STREP (S.PYOGENES) ISOLATED NO STAPHYLOCOCCUS AUREUS ISOLATED Performed at [facility]    Report Status 08/07/2015 FINAL  Final  Culture, Urine     Status: None   Collection Time: 08/10/15 10:59 AM  Result Value Ref Range Status   Specimen Description URINE, SUPRAPUBIC  Final   Special Requests NONE  Final   Culture MULTIPLE SPECIES PRESENT, SUGGEST RECOLLECTION  Final   Report Status 08/12/2015 FINAL  Final     ASSESSMENT: He spiked a fever again last night to 102.3. This could possibly have been related to his blood transfusion but I'm also concerned about new or worsening infection given the fever and worsening leukocytosis. His most recent urine culture is growing multiple organisms. Repeat blood cultures are pending. I will obtain stool for C. difficile screen.  PLAN: 1. Continue colistin 2. Await results of repeat blood cultures 3. Stool for C. difficile screen  Michel Bickers, MD [facility] [phone] pager   [phone]  cell 08/13/2015, 5:39 PM

## 2015-08-14 LAB — BASIC METABOLIC PANEL
Anion gap: 8 (ref 5–15)
BUN: 10 mg/dL (ref 6–20)
CHLORIDE: 99 mmol/L — AB (ref 101–111)
CO2: 25 mmol/L (ref 22–32)
CREATININE: 0.69 mg/dL (ref 0.61–1.24)
Calcium: 9.3 mg/dL (ref 8.9–10.3)
GFR calc non Af Amer: >60 mL/min (ref >60)
Glucose, Bld: 203 mg/dL — ABNORMAL HIGH (ref 65–99)
POTASSIUM: 4.6 mmol/L (ref 3.5–5.1)
Sodium: 132 mmol/L — ABNORMAL LOW (ref 135–145)

## 2015-08-14 LAB — CBC
HEMATOCRIT: 29.7 % — AB (ref 39.0–52.0)
Hemoglobin: 9.2 g/dL — ABNORMAL LOW (ref 13.0–17.0)
MCH: 26.2 pg (ref 26.0–34.0)
MCHC: 31 g/dL (ref 30.0–36.0)
MCV: 84.6 fL (ref 78.0–100.0)
PLATELETS: 783 10*3/uL — AB (ref 150–400)
RBC: 3.51 MIL/uL — AB (ref 4.22–5.81)
RDW: 15.3 % (ref 11.5–15.5)
WBC: 27 10*3/uL — AB (ref 4.0–10.5)

## 2015-08-14 LAB — C DIFFICILE QUICK SCREEN W PCR REFLEX
C Diff antigen: POSITIVE — AB
C Diff toxin: NEGATIVE

## 2015-08-14 LAB — GLUCOSE, CAPILLARY
GLUCOSE-CAPILLARY: 348 mg/dL — AB (ref 65–99)
Glucose-Capillary: 207 mg/dL — ABNORMAL HIGH (ref 65–99)
Glucose-Capillary: 226 mg/dL — ABNORMAL HIGH (ref 65–99)
Glucose-Capillary: 289 mg/dL — ABNORMAL HIGH (ref 65–99)

## 2015-08-14 MED ORDER — VANCOMYCIN 50 MG/ML ORAL SOLUTION
125.0000 mg | Freq: Four times a day (QID) | ORAL | Status: DC
  Administered 2015-08-14: 125 mg
  Filled 2015-08-14 (×4): qty 2.5

## 2015-08-14 MED ORDER — VANCOMYCIN 50 MG/ML ORAL SOLUTION: 125.0000 mg | Freq: Four times a day (QID) | ORAL | Status: DC

## 2015-08-14 NOTE — Progress Notes (Signed)
[facility] PROGRESS NOTE  Jason [name] EYC:[id number] DOB: [birth year] DOA: 07/10/2015 PCP: [name], MD  Brief Summary  Jason [name] is a 29 y.o. 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  He has had numerous hospitalizations over the last few years and has become progressively sicker.  He developed pulmonary embolism requiring IVC filter placement.  In August, he underwent tracheostomy placement secondary to ARDS.  He has also had infections with Burkholderia and Acinetobacter and is closely followed by infectious disease.  He has developed extensive antibiotic resistance.  During his last admission, he met with palliative care at which time he continued to endorse a good quality of life and maintained full code status.  He was readmitted with delirium and sepsis likely from UTI secondary to his chronic suprapubic catheter and from osteomyelitis.  He was started on broad spectrum antibiotics and ID was consulted.  Due to ongoing fevers and leukocytosis, he underwent CT scan which demonstrated osteomyelitis and he underwent bone biopsy on 10/27 by Dr. Migdalia Dk.  Bone culture grew mixed flora and ID felt that this was not the source of his fevers/illness.  Repeated urine cultures grew Pseudomonas and ID recommended treating with amikacin, however, he had worsening leukocytosis and ongoing fevers.    10/2:  Admitted and started on vanc and imipenem 10/11:  Completed imipenem, continued vanc 10/17:  Sacral decubitus ulcer with prior distal sacrococcygeal destruction or resection and foci of gas within the LEFT gluteus maximus question infection. 10/27:  Bone biopsy by Dr. Migdalia Dk, culture grew multiple  species 10/31:  Started aztreonam and flagyl 11/1:  Changed aztreonam and flagyl to amikacin 11/2:   D/c'd amikacin and started colistin for PSA UTI 11/4:  Spiked fever during blood transfusion (tx 2 units PRBC) 11/6:  Fevers and WBC finally starting to trend down.  C. Diff Ag positive, started oral vanco  Assessment/Plan  Toxic metabolic encephalopathy, occurs intermittently when he becomes sick.  Improved after changing to colistin -  Ongoing evaluation and management of his infections, see below -  Kidney function appears stable so doubt that this is accumulation of sedating metabolites  Sepsis due to MDR Pseudomonas Aeruginosa catheter-associated UTI present at time of admission (indwelling suprapubic catheter) and decubitus osteomyelitis - Suprapubic catheter changed twice by urology since admission - ID following -  Plastic surgery signed off - Antibiotics narrowed to treat Pseudomonas in urine with amikacin, however, he continued to spike fevers and had rising WBC count. -  Wounds have a mildly sweet odor today, no other new signs of infection -  Colistin does not appear to be working   C. Diff Ag positive (suspect colonization) but does not have diarrhea -  Will start oral vancomycin pending ID assessment  Acute renal failure due to sepsis, now resolved.    Hyponatremia  Mild, corrects toward normal considering hyperglycemia but persists probably due to osmolality of tube feeds  Hypomagnesemia: start oral magnesium supplementation  Diabetes mellitus type 1, hypoglycemia yesterday -  Continue lantus 18 units -  SSI reduces  Sacral decubitus and left flank decubitus - S/p bone biopsy, acell  Chronic anemia, hemoglobin gradually trending down.  Previous labs consistent with iron deficiency anemia vs. Anemia of chronic inflammation.  B12, folate, and TSH  were wnl. -  hgb responded to 2 units PRBC 11/4  Chronic respiratory failure with trach and vent - Patient took  himself off the vent 10/7 and currently stable on trach collar  - CCM M.D. on call 10/16: Agrees with outpatient follow-up with trach clinic and indicates that he may not be decannulated.   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 -  IVC filter placed 8/6 -  Not on A/C due to need for frequent debridements which have caused extensive bleeding, previously on apixaban -  Consider resumption of anticoagulation as perhaps VTE are contributing to fevers  History of spinal cord infarct with paraplegia, partial quadriplegia  Severe malnutrition in context of chronic illness -  On night time TF  Diet:  Regular with tube feeds Access:  PIV IVF:  off Proph:  lovenox  Code Status: full Family Communication: patient and his mother Disposition Plan:  Awaiting improvement in fevers, leukocytosis.  Having ongoing conversations with mother and patient about not responding to last-line antibiotics.     Consultants:  PCCM   Infectious disease  Plastics- [facility]  Procedures:  10/27:  Bone biopsy  Antibiotics:  Vancomycin 10/2 > 11/1  Imipenem 10/2 > 10/11  Aztreonam 10/31 > 11/1  Flagyl 10/31 > 11/1  Amikacin 11/1 > 11/2  Colisthimethate 11/2 >    HPI/Subjective:  No new symptoms.  No diarrhea.  Feels "fine"  Objective: Filed Vitals:   08/14/15 0024 08/14/15 0508 08/14/15 0624 08/14/15 0825  BP: 146/93  105/74   Pulse:  96 80 80  Temp: 100.2 F (37.9 C)  98.5 F (36.9 C)   TempSrc:   Oral   Resp:  15 16 16   Height:      Weight:   59.013 kg (130 lb 1.6 oz)   SpO2:  99% 100% 100%    Intake/Output Summary (Last 24 hours) at 08/14/15 1125 Last data filed at 08/14/15 0628  Gross per 24 hour  Intake   1693 ml  Output   4150 ml  Net  -2457 ml   Filed Weights   08/12/15 0500 08/13/15 0550 08/14/15 0624  Weight: 58.9 kg (129 lb 13.6 oz) 60.328 kg (133 lb) 59.013 kg (130 lb 1.6 oz)   Body mass index is 20.37 kg/(m^2).  Exam:   General:  Thin male, awake, alert, No acute  distress  HEENT:  NCAT, MMM  Cardiovascular:  RRR, nl S1, S2 no mrg, 2+ pulses, warm extremities  Respiratory:  CTAB, no increased WOB  Abdomen:   NABS, soft, NT/ND.  Ostomy bag full with light brown liquid stool.  Suprapubic catheter with clear yellow urine, no purulence or discharge.  Gastrostomy tube site c/d/i.    MSK:   Contractures of hands, unable to move legs with decreased muscle bulk   Skin:  Deferred exam today  Data Reviewed: Basic Metabolic Panel:  Recent Labs Lab 08/10/15 0500 08/11/15 0525 08/12/15 0522 08/13/15 0435 08/14/15 0435  NA 132* 132* 130* 133* 132*  K 4.7 4.6 5.1 5.1 4.6  CL 97* 101 98* 100* 99*  CO2 28 26 25 25 25   GLUCOSE 170* 161* 271* 285* 203*  BUN 10 10 9 10 10   CREATININE 0.58* 0.65 0.69 0.76 0.69  CALCIUM 8.8* 8.7* 8.7* 9.1 9.3  MG 1.5*  --   --   --   --    Liver Function Tests: No results for input(s): AST, ALT, ALKPHOS, BILITOT, PROT, ALBUMIN in the last 168 hours. No results for input(s): LIPASE, AMYLASE  in the last 168 hours. No results for input(s): AMMONIA in the last 168 hours. CBC:  Recent Labs Lab 08/10/15 0500 08/11/15 0525 08/12/15 0522 08/13/15 0435 08/13/15 0911 08/14/15 0435  WBC 25.6* 25.7* 28.4* 29.3*  --  27.0*  NEUTROABS  --   --   --   --  20.7*  --   HGB 7.5* 7.6* 7.1* 9.8*  --  9.2*  HCT 24.2* 24.1* 23.3* 30.3*  --  29.7*  MCV 85.2 86.7 84.7 83.2  --  84.6  PLT 766* 768* 773* 824*  --  783*    Recent Results (from the past 240 hour(s))  Anaerobic culture     Status: None   Collection Time: 08/04/15  7:08 PM  Result Value Ref Range Status   Specimen Description TISSUE LEFT SACRAL  Final   Special Requests NONE  Final   Gram Stain   Final    NO WBC SEEN NO ORGANISMS SEEN Performed at [facility]    Culture   Final    NO ANAEROBES ISOLATED Performed at [facility]    Report Status 08/09/2015 FINAL  Final  Fungus Culture with Smear     Status: None (Preliminary result)    Collection Time: 08/04/15  7:11 PM  Result Value Ref Range Status   Specimen Description TISSUE LEFT SACRAL  Final   Special Requests NONE  Final   Fungal Smear   Final    NO YEAST OR FUNGAL ELEMENTS SEEN Performed at [facility]    Culture   Final    CANDIDA ALBICANS Performed at [facility]    Report Status PENDING  Incomplete  AFB culture with smear     Status: None (Preliminary result)   Collection Time: 08/04/15  7:13 PM  Result Value Ref Range Status   Specimen Description TISSUE LEFT SACRAL  Final   Special Requests NONE  Final   Acid Fast Smear   Final    NO ACID FAST BACILLI SEEN Performed at [facility]    Culture   Final    CULTURE WILL BE EXAMINED FOR 6 WEEKS BEFORE ISSUING A FINAL REPORT Performed at [facility]    Report Status PENDING  Incomplete  Tissue culture     Status: None   Collection Time: 08/04/15  7:18 PM  Result Value Ref Range Status   Specimen Description TISSUE LEFT SACRAL  Final   Special Requests NONE  Final   Gram Stain   Final    NO WBC SEEN NO ORGANISMS SEEN Performed at [facility]    Culture   Final    MULTIPLE ORGANISMS PRESENT, NONE PREDOMINANT Note: NO GROUP A STREP (S.PYOGENES) ISOLATED NO STAPHYLOCOCCUS AUREUS ISOLATED Performed at [facility]    Report Status 08/07/2015 FINAL  Final  Culture, Urine     Status: None   Collection Time: 08/10/15 10:59 AM  Result Value Ref Range Status   Specimen Description URINE, SUPRAPUBIC  Final   Special Requests NONE  Final   Culture MULTIPLE SPECIES PRESENT, SUGGEST RECOLLECTION  Final   Report Status 08/12/2015 FINAL  Final  Culture, blood (single)     Status: None (Preliminary result)   Collection Time: 08/13/15 11:45 AM  Result Value Ref Range Status   Specimen Description BLOOD LEFT HAND  Final   Special Requests BOTTLES DRAWN AEROBIC ONLY 5CC  Final   Culture NO GROWTH < 24 HOURS  Final   Report Status  PENDING  Incomplete  C  difficile quick scan w PCR reflex     Status: Abnormal   Collection Time: 08/13/15  9:54 PM  Result Value Ref Range Status   C Diff antigen POSITIVE (A) NEGATIVE Final   C Diff toxin NEGATIVE NEGATIVE Final   C Diff interpretation   Final    C. difficile present, but toxin not detected. This indicates colonization. In most cases, this does not require treatment. If patient has signs and symptoms consistent with colitis, consider treatment. Requires ENTERIC precautions.     Studies: No results found.  Scheduled Meds: . baclofen  10 mg Oral 2 times per day  . baclofen  20 mg Oral QHS  . colistimethate (COLISTIN) IV  75 mg Intravenous Q12H  . collagenase   Topical Daily  . enoxaparin (LOVENOX) injection  40 mg Subcutaneous Daily  . feeding supplement (VITAL 1.0 CAL)  720 mL Per Tube q1800  . fentaNYL  50 mcg Transdermal Q72H  . Influenza vac split quadrivalent PF  0.5 mL Intramuscular Tomorrow-1000  . insulin aspart  0-5 Units Subcutaneous QHS  . insulin aspart  0-9 Units Subcutaneous TID WC  . insulin glargine  18 Units Subcutaneous Daily  . magnesium chloride  1 tablet Oral BID  . midodrine  30 mg Oral TID WC  . multivitamin with minerals  1 tablet Oral Daily  . pantoprazole sodium  40 mg Oral Daily  . pregabalin  100 mg Oral 3 times per day  . pregabalin  200 mg Oral QHS  . saccharomyces boulardii  250 mg Oral BID  . sodium chloride  10-40 mL Intracatheter Q12H  . vancomycin  125 mg Per Tube QID   Continuous Infusions: . sodium chloride 10 mL/hr at 08/10/15 2315  . lactated ringers 10 mL/hr at 08/04/15 1721    Principal Problem:   Acute encephalopathy Active Problems:   Palliative care encounter   Chronic anemia   Uncontrolled type 1 diabetes mellitus with hyperglycemia ([name])   Altered mental status   Tracheostomy dependent ([name])   H/O spinal cord injury   Chronic paraplegia ([name])   Acute on chronic respiratory failure ([name])   Hypomagnesemia   Decubitus ulcer of  right perineal ischial region, stage 4 ([name])   Decubitus ulcer of left perineal ischial region, stage 4 ([name])   Decubitus ulcer of sacral region, stage 4 ([name])   Complicated urinary tract infection   Infection with Pseudomonas aeruginosa resistant to multiple drugs   MDR Acinetobacter baumannii infection   History of MDR Pseudomonas aeruginosa infection    Time spent: 30 min    Andren Bethea, [facility] Pager [phone]. If 7PM-7AM, please contact night-coverage at [email or web address], password [email or web address] 08/14/2015, 11:25 AM  LOS: 35 days

## 2015-08-14 NOTE — Progress Notes (Signed)
Patient ID: Jason [name], male   DOB: Mar 04, 1986, 29 y.o.   MRN: [id number]         [facility] for Infectious Disease    Date of Admission:  07/10/2015   Prolonged antibiotic therapy        Day 6 colistin        Day 1 oral vancomycin         Principal Problem:   Acute encephalopathy Active Problems:   Palliative care encounter   Chronic anemia   Uncontrolled type 1 diabetes mellitus with hyperglycemia ([name])   Altered mental status   Tracheostomy dependent ([name])   H/O spinal cord injury   Chronic paraplegia ([name])   Acute on chronic respiratory failure ([name])   Hypomagnesemia   Decubitus ulcer of right perineal ischial region, stage 4 ([name])   Decubitus ulcer of left perineal ischial region, stage 4 ([name])   Decubitus ulcer of sacral region, stage 4 ([name])   Complicated urinary tract infection   Infection with Pseudomonas aeruginosa resistant to multiple drugs   MDR Acinetobacter baumannii infection   History of MDR Pseudomonas aeruginosa infection   . baclofen  10 mg Oral 2 times per day  . baclofen  20 mg Oral QHS  . colistimethate (COLISTIN) IV  75 mg Intravenous Q12H  . collagenase   Topical Daily  . enoxaparin (LOVENOX) injection  40 mg Subcutaneous Daily  . feeding supplement (VITAL 1.0 CAL)  720 mL Per Tube q1800  . fentaNYL  50 mcg Transdermal Q72H  . Influenza vac split quadrivalent PF  0.5 mL Intramuscular Tomorrow-1000  . insulin aspart  0-5 Units Subcutaneous QHS  . insulin aspart  0-9 Units Subcutaneous TID WC  . insulin glargine  18 Units Subcutaneous Daily  . magnesium chloride  1 tablet Oral BID  . midodrine  30 mg Oral TID WC  . multivitamin with minerals  1 tablet Oral Daily  . pantoprazole sodium  40 mg Oral Daily  . pregabalin  100 mg Oral 3 times per day  . pregabalin  200 mg Oral QHS  . saccharomyces boulardii  250 mg Oral BID  . sodium chloride  10-40 mL Intracatheter Q12H  . vancomycin  125 mg Per Tube QID    OBJECTIVE: Filed Vitals:     08/14/15 0508 08/14/15 0624 08/14/15 0825 08/14/15 1208  BP:  105/74    Pulse: 96 80 80 78  Temp:  98.5 F (36.9 C)    TempSrc:  Oral    Resp: 15 16 16 16   Height:      Weight:  130 lb 1.6 oz (59.013 kg)    SpO2: 99% 100% 100% 100%   Body mass index is 20.37 kg/(m^2).  Abdomen: soft brown stool in ostomy bag. Review of recent I and O's does not suggest any change in her ostomy output  Lab Results Lab Results  Component Value Date   WBC 27.0* 08/14/2015   HGB 9.2* 08/14/2015   HCT 29.7* 08/14/2015   MCV 84.6 08/14/2015   PLT 783* 08/14/2015    Lab Results  Component Value Date   CREATININE 0.69 08/14/2015   BUN 10 08/14/2015   NA 132* 08/14/2015   K 4.6 08/14/2015   CL 99* 08/14/2015   CO2 25 08/14/2015    Lab Results  Component Value Date   ALT 53 07/25/2015   AST 60* 07/25/2015   ALKPHOS 135* 07/25/2015   BILITOT 0.4 07/25/2015     Microbiology: Recent Results (from  the past 240 hour(s))  Anaerobic culture     Status: None   Collection Time: 08/04/15  7:08 PM  Result Value Ref Range Status   Specimen Description TISSUE LEFT SACRAL  Final   Special Requests NONE  Final   Gram Stain   Final    NO WBC SEEN NO ORGANISMS SEEN Performed at [facility]    Culture   Final    NO ANAEROBES ISOLATED Performed at [facility]    Report Status 08/09/2015 FINAL  Final  Fungus Culture with Smear     Status: None (Preliminary result)   Collection Time: 08/04/15  7:11 PM  Result Value Ref Range Status   Specimen Description TISSUE LEFT SACRAL  Final   Special Requests NONE  Final   Fungal Smear   Final    NO YEAST OR FUNGAL ELEMENTS SEEN Performed at [facility]    Culture   Final    CANDIDA ALBICANS Performed at [facility]    Report Status PENDING  Incomplete  AFB culture with smear     Status: None (Preliminary result)   Collection Time: 08/04/15  7:13 PM  Result Value Ref Range Status   Specimen Description TISSUE  LEFT SACRAL  Final   Special Requests NONE  Final   Acid Fast Smear   Final    NO ACID FAST BACILLI SEEN Performed at [facility]    Culture   Final    CULTURE WILL BE EXAMINED FOR 6 WEEKS BEFORE ISSUING A FINAL REPORT Performed at [facility]    Report Status PENDING  Incomplete  Tissue culture     Status: None   Collection Time: 08/04/15  7:18 PM  Result Value Ref Range Status   Specimen Description TISSUE LEFT SACRAL  Final   Special Requests NONE  Final   Gram Stain   Final    NO WBC SEEN NO ORGANISMS SEEN Performed at [facility]    Culture   Final    MULTIPLE ORGANISMS PRESENT, NONE PREDOMINANT Note: NO GROUP A STREP (S.PYOGENES) ISOLATED NO STAPHYLOCOCCUS AUREUS ISOLATED Performed at [facility]    Report Status 08/07/2015 FINAL  Final  Culture, Urine     Status: None   Collection Time: 08/10/15 10:59 AM  Result Value Ref Range Status   Specimen Description URINE, SUPRAPUBIC  Final   Special Requests NONE  Final   Culture MULTIPLE SPECIES PRESENT, SUGGEST RECOLLECTION  Final   Report Status 08/12/2015 FINAL  Final  Culture, blood (single)     Status: None (Preliminary result)   Collection Time: 08/13/15 11:45 AM  Result Value Ref Range Status   Specimen Description BLOOD LEFT HAND  Final   Special Requests BOTTLES DRAWN AEROBIC ONLY 5CC  Final   Culture NO GROWTH < 24 HOURS  Final   Report Status PENDING  Incomplete  C difficile quick scan w PCR reflex     Status: Abnormal   Collection Time: 08/13/15  9:54 PM  Result Value Ref Range Status   C Diff antigen POSITIVE (A) NEGATIVE Final   C Diff toxin NEGATIVE NEGATIVE Final   C Diff interpretation   Final    C. difficile present, but toxin not detected. This indicates colonization. In most cases, this does not require treatment. If patient has signs and symptoms consistent with colitis, consider treatment. Requires ENTERIC precautions.     ASSESSMENT: He is now afebrile. I  suspect that the positive  C. Difficile antigen reflex asymptomatic colonization. I will continue colistin and discontinue oral vancomycin now.  PLAN: 1. Continue colistin 2. Discontinue oral vancomycin  Michel Bickers, MD [facility] for Infectious [place] [facility] [phone] pager   [phone] cell 08/14/2015, 3:31 PM

## 2015-08-15 ENCOUNTER — Inpatient Hospital Stay: Payer: Self-pay | Admitting: Internal Medicine

## 2015-08-15 ENCOUNTER — Other Ambulatory Visit: Payer: Self-pay | Admitting: Endocrinology

## 2015-08-15 DIAGNOSIS — A047 Enterocolitis due to Clostridium difficile: Secondary | ICD-10-CM

## 2015-08-15 LAB — BASIC METABOLIC PANEL
ANION GAP: 7 (ref 5–15)
BUN: 11 mg/dL (ref 6–20)
CALCIUM: 9.4 mg/dL (ref 8.9–10.3)
CO2: 24 mmol/L (ref 22–32)
Chloride: 98 mmol/L — ABNORMAL LOW (ref 101–111)
Creatinine, Ser: 0.75 mg/dL (ref 0.61–1.24)
GLUCOSE: 305 mg/dL — AB (ref 65–99)
POTASSIUM: 4.9 mmol/L (ref 3.5–5.1)
Sodium: 129 mmol/L — ABNORMAL LOW (ref 135–145)

## 2015-08-15 LAB — GLUCOSE, CAPILLARY
GLUCOSE-CAPILLARY: 353 mg/dL — AB (ref 65–99)
GLUCOSE-CAPILLARY: 310 mg/dL — AB (ref 65–99)
GLUCOSE-CAPILLARY: 223 mg/dL — AB (ref 65–99)
GLUCOSE-CAPILLARY: 91 mg/dL (ref 65–99)

## 2015-08-15 LAB — CBC
HEMATOCRIT: 29 % — AB (ref 39.0–52.0)
HEMOGLOBIN: 9.1 g/dL — AB (ref 13.0–17.0)
MCH: 26.7 pg (ref 26.0–34.0)
MCHC: 31.4 g/dL (ref 30.0–36.0)
MCV: 85 fL (ref 78.0–100.0)
Platelets: 783 10*3/uL — ABNORMAL HIGH (ref 150–400)
RBC: 3.41 MIL/uL — AB (ref 4.22–5.81)
RDW: 14.8 % (ref 11.5–15.5)
WBC: 20.8 10*3/uL — ABNORMAL HIGH (ref 4.0–10.5)

## 2015-08-15 LAB — TISSUE CULTURE: Gram Stain: NONE SEEN

## 2015-08-15 MED ORDER — INSULIN ASPART 100 UNIT/ML ~~LOC~~ SOLN
0.0000 [IU] | SUBCUTANEOUS | Status: DC
  Administered 2015-08-15: 3 [IU] via SUBCUTANEOUS

## 2015-08-15 MED ORDER — INSULIN ASPART 100 UNIT/ML ~~LOC~~ SOLN
3.0000 [IU] | Freq: Three times a day (TID) | SUBCUTANEOUS | Status: DC
  Administered 2015-08-15 – 2015-08-16 (×2): 3 [IU] via SUBCUTANEOUS

## 2015-08-15 NOTE — Progress Notes (Signed)
Inpatient Diabetes Program Recommendations  AACE/ADA: New Consensus Statement on Inpatient Glycemic Control (2015)  Target Ranges:  Prepandial:   less than 140 mg/dL      Peak postprandial:   less than 180 mg/dL (1-2 hours)      Critically ill patients:  140 - 180 mg/dL  Results for DECLAN, MIER (MRN [id number]) as of 08/15/2015 11:22  Ref. Range 08/14/2015 08:04 08/14/2015 12:27 08/14/2015 17:17 08/14/2015 22:01 08/15/2015 07:28  Glucose-Capillary Latest Ref Range: 65-99 mg/dL 207 (H) 226 (H) 348 (H) 289 (H) 353 (H)   Review of Glycemic Control Current orders for Inpatient glycemic control: Lantus 15 units daily, Novolog 0-15 units TID with meals, Novolog 0-5 units HS, Novolog 3 units TID with meals  Inpatient Diabetes Program Recommendations: Correction (SSI): Glucose ranged from 207-353 mg/dl over the past 24 hours. Anticipate Vital tube feedings @ 60 cc/hr running from 6p to 6a is contributing to hyperglycemia. May want to consider changing frequency of CBGs and Novolog correction to Q4H so that patient can receive correction during the night if needed. Insulin - Meal Coverage: Please consider re-ordering meal coverage at Novolog 3 units TID with meals if patient is eating at least 50% of meals. Diet: If appropriate, please consider changing diet from Regular to Carb Modified.  Thanks, Barnie Alderman, RN, MSN, CDE Diabetes Coordinator Inpatient Diabetes Program [phone] (Team Pager from [place] to [place]) [phone] ([facility] office) [phone] [facility] office) [phone] [facility] office)

## 2015-08-15 NOTE — Care Management Note (Addendum)
Case Management Note  Patient Details  Name: Jason [name] MRN: [id number] Date of Birth: 01-21-1986  Subjective/Objective:                    Action/Plan:  [name] states they have all supplies EX trach , tube feeding etc and DME needed already at home.   Spoke with patient and mother at bedside , offered list of infusion companies for IV ABX . Patient and mother would like [facility] to supply his antibiotics at discharge . Pam with [facility] given referral.  [name] with [facility] aware potential discharge tomorrow on IV [name] aware patient is potential discharge to home tomorrow . They have everything they need to provide tube feeds however, they are unable to provide IV ABX .  Expected Discharge Date:                  Expected Discharge Plan:  [place]  In-House Referral:     Discharge planning Services  CM Consult  Post Acute Care Choice:    Choice offered to:     DME Arranged:    DME Agency:     HH Arranged:  RN [place] Agency:  [place]  Status of Service:  In process, will continue to follow  Medicare Important Message Given:    Date Medicare IM Given:    Medicare IM give by:    Date Additional Medicare IM Given:    Additional Medicare Important Message give by:     If discussed at [place] of Stay Meetings, dates discussed:    Additional Comments:  Marilu Favre, RN 08/15/2015, 3:11 PM

## 2015-08-15 NOTE — Progress Notes (Addendum)
    [facility] for Infectious Disease   Reason for visit: Follow up on fever, complicated UTI  Interval History: he has been afebrile for 48 hours, WBC down to 20.   Physical Exam: Constitutional:  Filed Vitals:   08/15/15 0852  BP:   Pulse: 96  Temp:   Resp: 16   patient appears in NAD Eyes: anicteric HENT: anicteric Respiratory: Normal respiratory effort; CTA B Cardiovascular: RRR GI: + ostomy  Review of Systems: Constitutional: negative for fevers and chills Gastrointestinal: negative for increased ostomy output  Lab Results  Component Value Date   WBC 20.8* 08/15/2015   HGB 9.1* 08/15/2015   HCT 29.0* 08/15/2015   MCV 85.0 08/15/2015   PLT 783* 08/15/2015    Lab Results  Component Value Date   CREATININE 0.75 08/15/2015   BUN 11 08/15/2015   NA 129* 08/15/2015   K 4.9 08/15/2015   CL 98* 08/15/2015   CO2 24 08/15/2015    Lab Results  Component Value Date   ALT 53 07/25/2015   AST 60* 07/25/2015   ALKPHOS 135* 07/25/2015     Microbiology: Recent Results (from the past 240 hour(s))  Culture, Urine     Status: None   Collection Time: 08/10/15 10:59 AM  Result Value Ref Range Status   Specimen Description URINE, SUPRAPUBIC  Final   Special Requests NONE  Final   Culture MULTIPLE SPECIES PRESENT, SUGGEST RECOLLECTION  Final   Report Status 08/12/2015 FINAL  Final  Culture, blood (single)     Status: None (Preliminary result)   Collection Time: 08/13/15 11:45 AM  Result Value Ref Range Status   Specimen Description BLOOD LEFT HAND  Final   Special Requests BOTTLES DRAWN AEROBIC ONLY 5CC  Final   Culture NO GROWTH < 24 HOURS  Final   Report Status PENDING  Incomplete  C difficile quick scan w PCR reflex     Status: Abnormal   Collection Time: 08/13/15  9:54 PM  Result Value Ref Range Status   C Diff antigen POSITIVE (A) NEGATIVE Final   C Diff toxin NEGATIVE NEGATIVE Final   C Diff interpretation   Final    C. difficile present, but toxin not  detected. This indicates colonization. In most cases, this does not require treatment. If patient has signs and symptoms consistent with colitis, consider treatment. Requires ENTERIC precautions.    Impression:  1. Fever - improved, and WBC improved.  2. C diff - consistent with colonization.   3. Encephalopathy - resolved  Plan: 1. Continue with colistin for 5 more days 2. No indication for treatment of C diff 3. If WBC stable and afebrile, ok from ID standpoint for d/c tomorrow

## 2015-08-15 NOTE — Progress Notes (Signed)
[facility] PROGRESS NOTE  Jason [name] WJX:[id number] DOB: [birth year] DOA: 07/10/2015 PCP: [name], MD  Brief Summary  Jason [name] is a 29 y.o. 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  He has had numerous hospitalizations over the last few years and has become progressively sicker.  He developed pulmonary embolism requiring IVC filter placement.  In August, he underwent tracheostomy placement secondary to ARDS.  He has also had infections with Burkholderia and Acinetobacter and is closely followed by infectious disease.  He has developed extensive antibiotic resistance.  During his last admission, he met with palliative care at which time he continued to endorse a good quality of life and maintained full code status.  He was readmitted with delirium and sepsis likely from UTI secondary to his chronic suprapubic catheter and from osteomyelitis.  He was started on broad spectrum antibiotics and ID was consulted.  Due to ongoing fevers and leukocytosis, he underwent CT scan which demonstrated osteomyelitis and he underwent bone biopsy on 10/27 by Dr. Migdalia Dk.  Bone culture grew mixed flora and ID felt that this was not the source of his fevers/illness.  Repeated urine cultures grew Pseudomonas and ID recommended treating with amikacin, however, he had worsening leukocytosis and ongoing fevers.    10/2:  Admitted and started on vanc and imipenem 10/11:  Completed imipenem, continued vanc 10/17:  Sacral decubitus ulcer with prior distal sacrococcygeal destruction or resection and foci of gas within the LEFT gluteus maximus question infection. 10/27:  Bone biopsy by Dr. Migdalia Dk, culture grew multiple  species 10/31:  Started aztreonam and flagyl 11/1:  Changed aztreonam and flagyl to amikacin 11/2:   D/c'd amikacin and started colistin for PSA UTI 11/4:  Spiked fever during blood transfusion (tx 2 units PRBC) 11/6:  Fevers and WBC finally starting to trend down.  C. Diff Ag positive but considered colonization  Assessment/Plan  Toxic metabolic encephalopathy, occurs intermittently when he becomes sick.  Improved after changing to colistin  Sepsis due to MDR Pseudomonas Aeruginosa catheter-associated UTI present at time of admission (indwelling suprapubic catheter) and decubitus osteomyelitis - Suprapubic catheter changed twice by urology since admission -  ID following -  Plastic surgery signed off -  WBC and fevers resolving on colistin -  Continue colistin through 11/12, then stop  C. Diff Ag positive (suspect colonization) but does not have diarrhea, likely colonization  Acute renal failure due to sepsis, now resolved.    Hyponatremia  Mild, corrects toward normal considering hyperglycemia but persists probably due to osmolality of tube feeds  Hypomagnesemia: start oral magnesium supplementation  Diabetes mellitus type 1, hypoglycemia yesterday -  Continue lantus 18 units -  Added 3 units aspart with meals and changed SSI to q4h per diabetes educator recommendations  Sacral decubitus and left flank decubitus - S/p bone biopsy, acell  Chronic anemia, hemoglobin gradually trending down.  Previous labs consistent with iron deficiency anemia vs. Anemia of chronic inflammation.  B12, folate, and TSH were wnl. -  hgb responded to 2 units PRBC 11/4  Chronic respiratory failure with trach and vent - Patient took himself off the vent 10/7 and currently stable on trach collar  - CCM M.D. on call 10/16: Agrees with outpatient follow-up with trach clinic and indicates  that he may not be decannulated.   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 -  IVC filter placed 8/6 -  Not on A/C due to need  for frequent debridements which have caused extensive bleeding, previously on apixaban -  Consider resumption of anticoagulation as perhaps VTE are contributing to fevers  History of spinal cord infarct with paraplegia, partial quadriplegia  Severe malnutrition in context of chronic illness -  On night time TF  Diet:  Diabetic with tube feeds Access:  PIV IVF:  off Proph:  lovenox  Code Status: full Family Communication: patient and his mother Disposition Plan:   Possible discharge to home with home health services tomorrow if continuing to improve   Consultants:  PCCM   Infectious disease  Plastics- [facility]  Procedures:  10/27:  Bone biopsy  Antibiotics:  Vancomycin 10/2 > 11/1  Imipenem 10/2 > 10/11  Aztreonam 10/31 > 11/1  Flagyl 10/31 > 11/1  Amikacin 11/1 > 11/2  Colisthimethate 11/2 >    HPI/Subjective:  Feels well, no new symptoms.  Denies cough, SOB, nausea, vomiting.  Wanted to talk about video games today.    Objective: Filed Vitals:   08/15/15 0303 08/15/15 0701 08/15/15 0852 08/15/15 1213  BP:  105/73    Pulse: 75 82 96 90  Temp:  98.9 F (37.2 C)    TempSrc:  Oral    Resp: 15 16 16 16   Height:      Weight:  58.06 kg (128 lb)    SpO2: 99% 100% 100% 100%    Intake/Output Summary (Last 24 hours) at 08/15/15 1309 Last data filed at 08/15/15 1027  Gross per 24 hour  Intake   1631 ml  Output   4150 ml  Net  -2519 ml   Filed Weights   08/13/15 0550 08/14/15 0624 08/15/15 0701  Weight: 60.328 kg (133 lb) 59.013 kg (130 lb 1.6 oz) 58.06 kg (128 lb)   Body mass index is 20.04 kg/(m^2).  Exam:   General:  Thin male, awake, alert, No acute distress, playing video games  HEENT:  NCAT, MMM  Cardiovascular:  RRR, nl S1, S2 no mrg, 2+ pulses, warm extremities  Respiratory:  CTAB, no increased WOB  Abdomen:   NABS, soft, NT/ND.      MSK:   Contractures of hands, unable to move legs with decreased muscle bulk   Skin:  Deferred  exam today  Data Reviewed: Basic Metabolic Panel:  Recent Labs Lab 08/10/15 0500 08/11/15 0525 08/12/15 0522 08/13/15 0435 08/14/15 0435 08/15/15 0500  NA 132* 132* 130* 133* 132* 129*  K 4.7 4.6 5.1 5.1 4.6 4.9  CL 97* 101 98* 100* 99* 98*  CO2 28 26 25 25 25 24   GLUCOSE 170* 161* 271* 285* 203* 305*  BUN 10 10 9 10 10 11   CREATININE 0.58* 0.65 0.69 0.76 0.69 0.75  CALCIUM 8.8* 8.7* 8.7* 9.1 9.3 9.4  MG 1.5*  --   --   --   --   --    Liver Function Tests: No results for input(s): AST, ALT, ALKPHOS, BILITOT, PROT, ALBUMIN in the last 168 hours. No results for input(s): LIPASE, AMYLASE in the last 168 hours. No results for input(s): AMMONIA in the last 168 hours. CBC:  Recent Labs Lab 08/11/15 0525 08/12/15 0522 08/13/15 0435 08/13/15 0911 08/14/15 0435 08/15/15 0500  WBC 25.7* 28.4* 29.3*  --  27.0* 20.8*  NEUTROABS  --   --   --  20.7*  --   --  HGB 7.6* 7.1* 9.8*  --  9.2* 9.1*  HCT 24.1* 23.3* 30.3*  --  29.7* 29.0*  MCV 86.7 84.7 83.2  --  84.6 85.0  PLT 768* 773* 824*  --  783* 783*    Recent Results (from the past 240 hour(s))  Culture, Urine     Status: None   Collection Time: 08/10/15 10:59 AM  Result Value Ref Range Status   Specimen Description URINE, SUPRAPUBIC  Final   Special Requests NONE  Final   Culture MULTIPLE SPECIES PRESENT, SUGGEST RECOLLECTION  Final   Report Status 08/12/2015 FINAL  Final  Culture, blood (single)     Status: None (Preliminary result)   Collection Time: 08/13/15 11:45 AM  Result Value Ref Range Status   Specimen Description BLOOD LEFT HAND  Final   Special Requests BOTTLES DRAWN AEROBIC ONLY 5CC  Final   Culture NO GROWTH 2 DAYS  Final   Report Status PENDING  Incomplete  C difficile quick scan w PCR reflex     Status: Abnormal   Collection Time: 08/13/15  9:54 PM  Result Value Ref Range Status   C Diff antigen POSITIVE (A) NEGATIVE Final   C Diff toxin NEGATIVE NEGATIVE Final   C Diff interpretation   Final     C. difficile present, but toxin not detected. This indicates colonization. In most cases, this does not require treatment. If patient has signs and symptoms consistent with colitis, consider treatment. Requires ENTERIC precautions.     Studies: No results found.  Scheduled Meds: . baclofen  10 mg Oral 2 times per day  . baclofen  20 mg Oral QHS  . colistimethate (COLISTIN) IV  75 mg Intravenous Q12H  . collagenase   Topical Daily  . enoxaparin (LOVENOX) injection  40 mg Subcutaneous Daily  . feeding supplement (VITAL 1.0 CAL)  720 mL Per Tube q1800  . fentaNYL  50 mcg Transdermal Q72H  . Influenza vac split quadrivalent PF  0.5 mL Intramuscular Tomorrow-1000  . insulin aspart  0-5 Units Subcutaneous QHS  . insulin aspart  0-9 Units Subcutaneous TID WC  . insulin glargine  18 Units Subcutaneous Daily  . magnesium chloride  1 tablet Oral BID  . midodrine  30 mg Oral TID WC  . multivitamin with minerals  1 tablet Oral Daily  . pantoprazole sodium  40 mg Oral Daily  . pregabalin  100 mg Oral 3 times per day  . pregabalin  200 mg Oral QHS  . saccharomyces boulardii  250 mg Oral BID  . sodium chloride  10-40 mL Intracatheter Q12H   Continuous Infusions: . sodium chloride 10 mL/hr at 08/10/15 2315  . lactated ringers 10 mL/hr at 08/04/15 1721    Principal Problem:   Acute encephalopathy Active Problems:   Palliative care encounter   Chronic anemia   Uncontrolled type 1 diabetes mellitus with hyperglycemia ([name])   Altered mental status   Tracheostomy dependent ([name])   H/O spinal cord injury   Chronic paraplegia ([name])   Acute on chronic respiratory failure ([name])   Hypomagnesemia   Decubitus ulcer of right perineal ischial region, stage 4 ([name])   Decubitus ulcer of left perineal ischial region, stage 4 ([name])   Decubitus ulcer of sacral region, stage 4 ([name])   Complicated urinary tract infection   Infection with Pseudomonas aeruginosa resistant to multiple drugs   MDR  Acinetobacter baumannii infection   History of MDR Pseudomonas aeruginosa infection    Time spent: 30 min  Janece Canterbury  [facility] Pager [phone]. If 7PM-7AM, please contact night-coverage at [email or web address], password [facility] 08/15/2015, 1:09 PM  LOS: 36 days

## 2015-08-15 NOTE — Brief Op Note (Deleted)
07/10/2015 - 08/04/2015  4:24 PM  PATIENT:  Evlyn Kanner Pippen  29 y.o. male  PRE-OPERATIVE DIAGNOSIS:  SACRAL ULCER   POST-OPERATIVE DIAGNOSIS:  SACRAL ULCER   PROCEDURE:  Procedure(s): IRRIGATION AND DEBRIDEMENT OF SACRAL ULCER AND  (N/A) APPLICATION OF A-CELL  (N/A)  SURGEON:  Surgeon(s) and Role:    * Joslyn Ramos S Shiryl Ruddy, DO - Primary  PHYSICIAN ASSISTANT:   ASSISTANTS: none   ANESTHESIA:   general  EBL:  Total I/O In: 61 [P.O.:960; IV Piggyback:202] Out: 3050 [Urine:2450; Stool:600]  BLOOD ADMINISTERED:none  DRAINS: none   LOCAL MEDICATIONS USED:  NONE  SPECIMEN:  Source of Specimen:  sacral bone  DISPOSITION OF SPECIMEN:  micro  COUNTS:  YES  TOURNIQUET:  * No tourniquets in log *  DICTATION: .Dragon Dictation  PLAN OF CARE: Admit to inpatient   PATIENT DISPOSITION:  PACU - hemodynamically stable.   Delay start of Pharmacological VTE agent (>24hrs) due to surgical blood loss or risk of bleeding: no

## 2015-08-16 DIAGNOSIS — R6520 Severe sepsis without septic shock: Secondary | ICD-10-CM

## 2015-08-16 LAB — GLUCOSE, CAPILLARY
Glucose-Capillary: 106 mg/dL — ABNORMAL HIGH (ref 65–99)
Glucose-Capillary: 83 mg/dL (ref 65–99)
Glucose-Capillary: 92 mg/dL (ref 65–99)
Glucose-Capillary: 92 mg/dL (ref 65–99)

## 2015-08-16 LAB — BASIC METABOLIC PANEL
ANION GAP: 8 (ref 5–15)
BUN: 9 mg/dL (ref 6–20)
CALCIUM: 10.4 mg/dL — AB (ref 8.9–10.3)
CO2: 27 mmol/L (ref 22–32)
Chloride: 100 mmol/L — ABNORMAL LOW (ref 101–111)
Creatinine, Ser: 0.75 mg/dL (ref 0.61–1.24)
GLUCOSE: 92 mg/dL (ref 65–99)
Potassium: 5 mmol/L (ref 3.5–5.1)
SODIUM: 135 mmol/L (ref 135–145)

## 2015-08-16 LAB — CBC
HCT: 30.8 % — ABNORMAL LOW (ref 39.0–52.0)
HEMOGLOBIN: 9.5 g/dL — AB (ref 13.0–17.0)
MCH: 26.3 pg (ref 26.0–34.0)
MCHC: 30.8 g/dL (ref 30.0–36.0)
MCV: 85.3 fL (ref 78.0–100.0)
Platelets: 841 10*3/uL — ABNORMAL HIGH (ref 150–400)
RBC: 3.61 MIL/uL — AB (ref 4.22–5.81)
RDW: 14.7 % (ref 11.5–15.5)
WBC: 18.4 10*3/uL — AB (ref 4.0–10.5)

## 2015-08-16 MED ORDER — FERROUS SULFATE 325 (65 FE) MG PO TABS: 325.0000 mg | ORAL_TABLET | Freq: Every day | ORAL | Status: DC

## 2015-08-16 MED ORDER — SODIUM CHLORIDE 0.9 % IV SOLN: 75.0000 mg | Freq: Two times a day (BID) | INTRAVENOUS | Status: DC

## 2015-08-16 MED ORDER — SACCHAROMYCES BOULARDII 250 MG PO CAPS: 250.0000 mg | ORAL_CAPSULE | Freq: Two times a day (BID) | ORAL | Status: DC

## 2015-08-16 MED ORDER — MAGNESIUM OXIDE 400 MG PO CAPS: 400.0000 mg | ORAL_CAPSULE | Freq: Two times a day (BID) | ORAL | Status: DC

## 2015-08-16 MED ORDER — HEPARIN SOD (PORK) LOCK FLUSH 100 UNIT/ML IV SOLN
250.0000 [IU] | INTRAVENOUS | Status: AC | PRN
  Administered 2015-08-16: 250 [IU]

## 2015-08-16 MED ORDER — INSULIN GLARGINE 100 UNIT/ML ~~LOC~~ SOLN: 15.0000 [IU] | Freq: Every day | SUBCUTANEOUS | Status: DC

## 2015-08-16 NOTE — Care Management Note (Signed)
Case Management Note  Patient Details  Name: KENAZ OLAFSON MRN: [id number] Date of Birth: Aug 19, 1986  Subjective/Objective:                    Action/Plan:  Spoke with patient and his mother regarding discharge to home today . [name] with [facility] aware of discharge today , all information faxed to her . Pam with [employer] also aware and has prescription for IV ABX   Bedside nurse will call SW to arrange transportation home when ready   Expected Discharge Date:                  Expected Discharge Plan:  [place]  In-House Referral:     Discharge planning Services  CM Consult  Post Acute Care Choice:    Choice offered to:     DME Arranged:    DME Agency:     HH Arranged:  RN [place] Agency:  [place]  Status of Service:  In process, will continue to follow  Medicare Important Message Given:    Date Medicare IM Given:    Medicare IM give by:    Date Additional Medicare IM Given:    Additional Medicare Important Message give by:     If discussed at [place] of Stay Meetings, dates discussed:    Additional Comments:  Marilu Favre, RN 08/16/2015, 12:39 PM

## 2015-08-16 NOTE — Progress Notes (Signed)
    [name] for Infectious Disease   Reason for visit: Follow up on fever, complicated UTI  Interval History: Remains afebrile, WBC down to 18.   Physical Exam: Constitutional:  Filed Vitals:   08/16/15 0845  BP: 108/75  Pulse: 75  Temp:   Resp: 14   patient appears in NAD Eyes: anicteric HENT: anicteric Respiratory: Normal respiratory effort; CTA B Cardiovascular: RRR GI: + ostomy  Review of Systems: Constitutional: negative for fevers and chills Gastrointestinal: negative for increased ostomy output  Lab Results  Component Value Date   WBC 18.4* 08/16/2015   HGB 9.5* 08/16/2015   HCT 30.8* 08/16/2015   MCV 85.3 08/16/2015   PLT 841* 08/16/2015    Lab Results  Component Value Date   CREATININE 0.75 08/16/2015   BUN 9 08/16/2015   NA 135 08/16/2015   K 5.0 08/16/2015   CL 100* 08/16/2015   CO2 27 08/16/2015    Lab Results  Component Value Date   ALT 53 07/25/2015   AST 60* 07/25/2015   ALKPHOS 135* 07/25/2015     Microbiology: Recent Results (from the past 240 hour(s))  Culture, Urine     Status: None   Collection Time: 08/10/15 10:59 AM  Result Value Ref Range Status   Specimen Description URINE, SUPRAPUBIC  Final   Special Requests NONE  Final   Culture MULTIPLE SPECIES PRESENT, SUGGEST RECOLLECTION  Final   Report Status 08/12/2015 FINAL  Final  Culture, blood (single)     Status: None (Preliminary result)   Collection Time: 08/13/15 11:45 AM  Result Value Ref Range Status   Specimen Description BLOOD LEFT HAND  Final   Special Requests BOTTLES DRAWN AEROBIC ONLY 5CC  Final   Culture NO GROWTH 2 DAYS  Final   Report Status PENDING  Incomplete  C difficile quick scan w PCR reflex     Status: Abnormal   Collection Time: 08/13/15  9:54 PM  Result Value Ref Range Status   C Diff antigen POSITIVE (A) NEGATIVE Final   C Diff toxin NEGATIVE NEGATIVE Final   C Diff interpretation   Final    C. difficile present, but toxin not detected. This  indicates colonization. In most cases, this does not require treatment. If patient has signs and symptoms consistent with colitis, consider treatment. Requires ENTERIC precautions.    Impression:  1. Fever - improved, and WBC improved.  2. C diff - consistent with colonization.   3. Encephalopathy - resolved  Plan: 1. Continue with colistin for 3 more days through 11/11  2. No indication for treatment of C diff 3. D/c today, we will arrange follow up in our office 4. BMP in 2 days on 11/10 and then follow up BMP on 11/14 to [facility]

## 2015-08-16 NOTE — Progress Notes (Signed)
Implemented all new dressing changes for patient before he left. Supplies sent home with patient. Patient d/c home with PICC. Discussed discharge summary with patient mom. Reviewed all medications with mom. Patient being transported home via [place]. Patient ready for discharge.

## 2015-08-16 NOTE — Discharge Summary (Signed)
Physician Discharge Summary  Robb Sibal [place] YOV:[id number] DOB: 1986/03/24 DOA: 07/10/2015  PCP: [name], MD  Admit date: 07/10/2015 Discharge date: 08/16/2015  Recommendations for Outpatient Follow-up:  1. Discharge to home with home health services, PT/OT/RN 2. Colistin through 11/11, then stop.  PICC line to be discontinued after completion of antibiotics.   3. F/u with ID on 11/14 4. BMP on 11/10 to be drawn by RN and called to the ID office, attention Dr. Linus Salmons 5. Pulmonary trach clinic in 2 weeks  Discharge Diagnoses:  Principal Problem:   Severe sepsis ([place]) Active Problems:   Type 1 diabetes, uncontrolled, with neuropathy ([name])   Acute encephalopathy   Sacral decubitus ulcer   Palliative care encounter   Severe protein-calorie malnutrition ([name])   Chronic anemia   Uncontrolled type 1 diabetes mellitus with hyperglycemia ([name])   Altered mental status   Tracheostomy dependent ([name])   H/O spinal cord injury   Chronic paraplegia ([name])   Acute on chronic respiratory failure ([name])   Hypomagnesemia   Decubitus ulcer of right perineal ischial region, stage 4 ([name])   Decubitus ulcer of left perineal ischial region, stage 4 ([name])   Decubitus ulcer of sacral region, stage 4 ([name])   Complicated urinary tract infection   Infection with Pseudomonas aeruginosa resistant to multiple drugs   MDR Acinetobacter baumannii infection   History of MDR Pseudomonas aeruginosa infection   Discharge Condition: stable, improved  Diet recommendation: regular  Wt Readings from Last 3 Encounters:  08/16/15 58.33 kg (128 lb 9.5 oz)  07/05/15 44.906 kg (99 lb)  06/01/15 50.349 kg (111 lb)    History of present illness:  Jason [name] is a 29 y.o. 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 He has developed stage 4 sacral decubitus  ulcers with chronic osteomyelitis and resulting in intermittent bacteremia and sepsis. He has had numerous hospitalizations over the last few years and has become progressively sicker. He developed pulmonary embolism requiring IVC filter placement. In August, he underwent tracheostomy placement secondary to ARDS. He has also had infections with Burkholderia and Acinetobacter and is closely followed by infectious disease. He has developed extensive antibiotic resistance. During his last admission, he met with palliative care at which time he continued to endorse a good quality of life and maintained full code status. He was readmitted with delirium and sepsis likely from UTI secondary to his chronic suprapubic catheter and from osteomyelitis. He was started on broad spectrum antibiotics and ID was consulted. Due to ongoing fevers and leukocytosis, he underwent CT scan which demonstrated osteomyelitis and he underwent bone biopsy on 10/27 by Dr. Migdalia Dk. Bone culture grew mixed flora and ID felt that this was not the source of his fevers/illness. Repeated urine cultures grew Pseudomonas and ID recommended treating with amikacin, however, he had worsening leukocytosis and ongoing fevers.   10/2: Admitted and started on vanc and imipenem 10/11: Completed imipenem, continued vanc 10/17: Sacral decubitus ulcer with prior distal sacrococcygeal destruction or resection and foci of gas within the LEFT gluteus maximus question infection. 10/27: Bone biopsy by Dr. Migdalia Dk, culture grew multiple species 10/31: Started aztreonam and flagyl 11/1: Changed aztreonam and flagyl to amikacin to cover pseudomonas UTI 11/2: Still febrile with rising WBC.  D/c'd amikacin and started colistin for PSA UTI 11/4: Spiked fever during blood transfusion (tx 2 units PRBC) 11/6: Fevers and WBC  finally starting to trend down. C. Diff Ag positive but considered colonization  Hospital Course:    Sepsis due to MDR  Pseudomonas Aeruginosa catheter-associated UTI present at time of admission (indwelling suprapubic catheter) and decubitus osteomyelitis.  See above for major hospital events regarding infections.  Infectious disease was consulted and assisted with management throughout.  Initially he was started on broad spectrum antibiotics to cover UTI and osteomyelitis.  His antibiotics were narrowed to cover UTI only as his osteomyelitis was felt to be chronic and bone biopsy grew mixed flora.  He eventually responded to colistin and did not have any renal complications after several days on the antibiotic.  He is being discharged home to complete 3 more days of colistin with close follow up at the infectious disease clinic.  WBC is trending down but not normal at time of discharge, however, he has been afebrile for > 48 hours and is clinically improved.    Toxic metabolic encephalopathy, occurred intermittently when he became septic. Improved after changing to colistin  C. Diff Ag positive (suspect colonization) but does not have diarrhea, likely colonization  Acute renal failure due to sepsis, resolved with IVF and antibiotics.  Creatinine stable.     Hyponatremia, mild, corrected toward normal considering hyperglycemia but persisted probably due to osmolality of tube feeds  Hypomagnesemia: started oral magnesium supplementation  Diabetes mellitus type 1, with intermittent hyper and hypoglycemia.  Continued lantus 18 units with 3 units aspart with meals and changed SSI to q4h per diabetes educator recommendations with better blood sugar control.  Recommended he resume his home regimen at discharge.    Sacral decubitus ulcers stage 4, left heel stage 4 and forefoot stage 3 and left flank decubitus stage 2, S/p bone biopsy, acell of sacral ulcers.  Continue wet to dry dressing changes once daily with santyl to buttocks.  Left foot with foam dressing to be changed every 3-4 days or sooner as needed.  Back to be  covered with vaseline gauze and dry dressing once daily.    Chronic anemia, likely due to chronic disease. Previous labs consistent with iron deficiency anemia vs. Anemia of chronic inflammation. B12, folate, and TSH were wnl.  Hgb responded to 2 units PRBC 11/4.  Started iron supplementation at discharge.    Chronic respiratory failure with trach and vent.  Patient took himself off the vent 10/7 and remained stable on trach collar .  CCM M.D. on call 10/16: Agreed with outpatient follow-up with trach clinic and indicated that he may not be decannulated.   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.  IVC filter placed 8/6.  Not on A/C due to need for frequent debridements which have caused extensive bleeding, previously on apixaban.    History of spinal cord infarct with paraplegia, partial quadriplegia  Severe malnutrition in context of chronic illness.  Continued night time TF   Consultants:  PCCM   Infectious disease  Plastics- [facility]  Procedures:  10/27: Bone biopsy  Antibiotics:  Vancomycin 10/2 > 11/1  Imipenem 10/2 > 10/11  Aztreonam 10/31 > 11/1  Flagyl 10/31 > 11/1  Amikacin 11/1 > 11/2  Colisthimethate 11/2 >  Discharge Exam: Filed Vitals:   08/16/15 0845  BP: 108/75  Pulse: 75  Temp:   Resp: 14   Filed Vitals:   08/16/15 0429 08/16/15 0435 08/16/15 0612 08/16/15 0845  BP:  104/72 108/75 108/75  Pulse: 82 80 80 75  Temp:  99.7 F (37.6 C) 99.3 F (37.4 C)   TempSrc:  Oral Oral   Resp: 18 18 19 14   Height:      Weight:   58.33 kg (128 lb 9.5 oz)   SpO2: 99% 100% 100% 100%     General: Thin male, awake, alert, No acute distress, playing video games  HEENT: NCAT, MMM  Cardiovascular: RRR, nl S1, S2 no mrg, 2+ pulses, warm extremities  Respiratory: CTAB, no increased WOB  Abdomen: NABS, soft, NT/ND.   MSK: Contractures of hands, unable to move legs with decreased muscle bulk   Skin: Buttock ulcers not observed today (see exam from  11/5), left heel with some purulent drainage but not necrotic, no foul odor, margins not erythematous.    Discharge Instructions      Discharge Instructions    Call MD for:  difficulty breathing, headache or visual disturbances    Complete by:  As directed      Call MD for:  extreme fatigue    Complete by:  As directed      Call MD for:  hives    Complete by:  As directed      Call MD for:  persistant dizziness or light-headedness    Complete by:  As directed      Call MD for:  persistant nausea and vomiting    Complete by:  As directed      Call MD for:  redness, tenderness, or signs of infection (pain, swelling, redness, odor or green/yellow discharge around incision site)    Complete by:  As directed      Call MD for:  severe uncontrolled pain    Complete by:  As directed      Call MD for:  temperature >100.4    Complete by:  As directed      Diet general    Complete by:  As directed      Increase activity slowly    Complete by:  As directed             Medication List    STOP taking these medications        ciprofloxacin 500 MG tablet  Commonly known as:  CIPRO     doxycycline 100 MG tablet  Commonly known as:  VIBRA-TABS     loratadine 10 MG tablet  Commonly known as:  CLARITIN      TAKE these medications        ACCU-CHEK SMARTVIEW test strip  Generic drug:  glucose blood  Use to check blood sugar 4 times daily.     acetaminophen 325 MG tablet  Commonly known as:  TYLENOL  Take 2 tablets (650 mg total) by mouth every 6 (six) hours as needed for mild pain, moderate pain, fever or headache.     albuterol (2.5 MG/3ML) 0.083% nebulizer solution  Commonly known as:  PROVENTIL  Take 3 mLs (2.5 mg total) by nebulization every 4 (four) hours as needed for wheezing or shortness of breath.     baclofen 10 MG tablet  Commonly known as:  LIORESAL  Take 1-2 tablets (10-20 mg total) by mouth 3 (three) times daily. Takes 67m in morning and lunchtime and takes 290m at bedtime     colistimethate 75 mg in sodium chloride 0.9 % 100 mL  Inject 75 mg into the vein every 12 (twelve) hours.     COLLAGEN EX  Apply 1 application topically daily. Apply daily to sacral/ischial wound, pack with dry kerlix, cover with abd gauze     collagenase ointment  Commonly known as:  SANTYL  Apply 1 application topically daily. Apply to back and foot wound every M/W/F     fentaNYL 50 MCG/HR  Commonly known as:  DURAGESIC - dosed mcg/hr  Place 1 patch (50 mcg total) onto the skin every 3 (three) days.     ferrous sulfate 325 (65 FE) MG tablet  Take 1 tablet (325 mg total) by mouth daily with breakfast.  Start taking on:  08/17/2015     glucagon 1 MG injection  Commonly known as:  GLUCAGON EMERGENCY  INJECT INTO MUSCLE ONCE AS NEEDED     GLUCERNA Liqd  by PEG Tube route continuous. 70 ml/hr     NOVOLOG FLEXPEN 100 UNIT/ML FlexPen  Generic drug:  insulin aspart  Inject 0-10 Units into the skin 4 (four) times daily as needed for high blood sugar (CBG >200). Per sliding scale: CBG <200 0 units, 201-300 4 units, 301-400 6 units, >400 10 units     insulin aspart 100 UNIT/ML injection  Commonly known as:  novoLOG  Inject 5 Units into the skin 3 (three) times daily with meals.     insulin glargine 100 UNIT/ML injection  Commonly known as:  LANTUS  Inject 0.15 mLs (15 Units total) into the skin at bedtime.     Insulin Syringes (Disposable) U-100 1 ML Misc  Dispense 29G x 1/2 inch, use as directed, diagnosis code E 10.41     LORazepam 0.5 MG tablet  Commonly known as:  ATIVAN  Take 0.5 mg by mouth 3 (three) times daily as needed for anxiety.     Magnesium Oxide 400 MG Caps  Take 1 capsule (400 mg total) by mouth 2 (two) times daily.     midodrine 10 MG tablet  Commonly known as:  PROAMATINE  Take 2 tablets (20 mg total) by mouth 3 (three) times daily with meals.     mirtazapine 7.5 MG tablet  Commonly known as:  REMERON  Place 1 tablet (7.5 mg total) into  feeding tube at bedtime.     multivitamin with minerals Tabs tablet  Place 1 tablet into feeding tube daily.     ondansetron 8 MG disintegrating tablet  Commonly known as:  ZOFRAN ODT  Place 1 tablet (8 mg total) into feeding tube every 8 (eight) hours as needed for nausea or vomiting.     Oxycodone HCl 20 MG Tabs  Take 1 tablet (20 mg total) by mouth every 6 (six) hours as needed. With dressing changes and for break through pain but NO MORE THAN 4 PER DAY     pantoprazole sodium 40 mg/20 mL Pack  Commonly known as:  PROTONIX  Place 20 mLs (40 mg total) into feeding tube daily.     pregabalin 100 MG capsule  Commonly known as:  LYRICA  Place 1 capsule (100 mg total) into feeding tube 3 (three) times daily. Take 100 mg by mouth in the morning, take 100 mg by mouth in the afternoon, take 100 mg by mouth in the evening and take 200 mg by mouth at bedtime.     saccharomyces boulardii 250 MG capsule  Commonly known as:  FLORASTOR  Take 1 capsule (250 mg total) by mouth 2 (two) times daily.       Follow-up Information    Follow up with Phil Campbell              In 3 weeks.   Contact information:   [address] [place] [address] [phone]      Follow up with [name], MD. Schedule an appointment as soon as possible for a visit in 3 weeks.   Specialty:  Family Medicine   Contact information:   [place] [address] [phone]       Follow up with Scharlene Gloss, MD. Schedule an appointment as soon as possible for a visit on 08/22/2015.   Specialty:  Infectious Diseases   Why:  or with any available provider on 11/14   Contact information:   [address] [address] [phone]       Follow up with [facility] In 2 weeks.   Specialty:  Pulmonology   Why:  tracheostomy clinic   Contact information:   [place]  [place] [phone]       The results of significant diagnostics from this hospitalization (including imaging, microbiology, ancillary and laboratory) are listed below for reference.    Significant Diagnostic Studies: Ct Abdomen Pelvis W Contrast  07/25/2015  CLINICAL DATA:  GERD fever of unknown origin, recent MRSA infection, strep bacteremia, respiratory failure, admitted with hallucinations and confusion, question UTI ; history spinal cord injury and paraplegia, type I diabetes mellitus EXAM: CT ABDOMEN AND PELVIS WITH CONTRAST TECHNIQUE: Multidetector CT imaging of the abdomen and pelvis was performed using the standard protocol following bolus administration of intravenous contrast. Sagittal and coronal MPR images reconstructed from axial data set. CONTRAST:  138m OMNIPAQUE IOHEXOL 300 MG/ML SOLN IV. Dilute oral contrast. COMPARISON:  07/11/2015 FINDINGS: Infiltrate RIGHT lower lobe. Mild atelectasis LEFT lower lobe. Peribronchial thickening in both lower lobes with RIGHT lower lobe bronchiectasis. Contracted gallbladder. Scarring at medial aspect of inferior pole RIGHT kidney unchanged. Liver, spleen, pancreas, kidneys, and adrenal glands otherwise unremarkable. IVC filter and gastrostomy tube. Normal appendix. Question bowel wall thickening involving the cecum and ascending colon versus artifact from underdistention. LEFT lower quadrant descending colostomy. Stomach and remaining bowel loops unremarkable. Bladder wall thickening, question related to chronic suprapubic catheterization in urinary tract infection versus chronic outlet obstruction. LEFT ureter distended though this could be related to bladder distention. Vascular structures grossly patent. Large sacral decubitus ulcer with prior distal sacrococcygeal destruction or resection. Foci of soft tissue gas within LEFT gluteus maximus question penetration by decubitus ulcer and infection. Diffuse soft tissue edema. Additional foci of  nonspecific soft tissue gas RIGHT lateral abdomen. No mass, adenopathy, free air or free fluid. Bones demineralized. IMPRESSION: Questionable wall thickening of the cecum and ascending colon versus artifact from underdistention, unable to exclude colitis. Diffuse bladder wall thickening which may be related to chronic catheterization/infection or outlet obstruction. Diffuse soft tissue edema which can be seen with anasarca and hypoproteinemia. Peribronchial thickening in both lower lobes with RIGHT lower lobe bronchiectasis and RIGHT lower lobe infiltrate. Sacral decubitus ulcer with prior distal sacrococcygeal destruction or resection and foci of gas within the LEFT gluteus maximus question infection. Additional nonspecific foci of soft tissue gas in the RIGHT lateral abdominal wall soft tissue planes. Electronically Signed   By: MLavonia DanaM.D.   On: 07/25/2015 17:46   Dg Chest Port 1 View  07/27/2015  CLINICAL DATA:  Tracheostomy and fever EXAM: PORTABLE CHEST 1 VIEW COMPARISON:  07/16/2015 FINDINGS: Tracheostomy tube in unchanged position over the tracheal air column. Heart size and vascular pattern are normal. There are mild bilateral lower lobe infiltrates. When compared to the prior study  there both significantly improved. No pleural effusion. Stable mild expansile deformity distal right clavicle possibly due to prior trauma. IMPRESSION: Persistent but improved bilateral lower lobe infiltrates. Electronically Signed   By: Skipper Cliche M.D.   On: 07/27/2015 08:09   Dg Swallowing Func-speech Pathology  07/20/2015  Objective Swallowing Evaluation:   Patient Details Name: WAYMON LASER MRN: [id number] Date of Birth: 12-20-1985 Today's Date: 07/20/2015 Time: SLP Start Time (ACUTE ONLY): 1400-SLP Stop Time (ACUTE ONLY): 1420 SLP Time Calculation (min) (ACUTE ONLY): 20 min Past Medical History: Past Medical History Diagnosis Date . GERD (gastroesophageal reflux disease)  . Asthma  . Hx MRSA infection     on face . Gastroparesis  . Diabetic neuropathy ([place])  . Seizures ([place])  . Family history of anesthesia complication    Pt mother can't have epidural procedures . Dysrhythmia  . Pneumonia  . Arthritis  . Fibromyalgia  . Stroke ([place]) 01/29/2014   spinal stroke ; "quadriplegic since" . Type I diabetes mellitus ([place])    sees Dr. Loanne Drilling  . Syncope 02/16/2015 Past Surgical History: Past Surgical History Procedure Laterality Date . Tonsillectomy   . Multiple extractions with alveoloplasty N/A 08/03/2014   Procedure: MULTIPLE EXTRACTIONS;  Surgeon: Gae Bon, DDS;  Location: [facility];  Service: Oral Surgery;  Laterality: N/A; . Tee without cardioversion N/A 08/17/2014   Procedure: TRANSESOPHAGEAL ECHOCARDIOGRAM (TEE);  Surgeon: Dorothy Spark, MD;  Location[facility];  Service: Cardiovascular;  Laterality: N/A; . Debridment of decubitus ulcer N/A 10/04/2014   Procedure: DEBRIDMENT OF DECUBITUS ULCER;  Surgeon: Georganna Skeans, MD;  Location: [facility];  Service: General;  Laterality: N/A; . Laparoscopic diverted colostomy N/A 10/12/2014   Procedure: LAPAROSCOPIC DIVERTING COLOSTOMY;  Surgeon: Donnie Mesa, MD;  Location: [facility];  Service: General;  Laterality: N/A; . Insertion of suprapubic catheter N/A 10/12/2014   Procedure: INSERTION OF SUPRAPUBIC CATHETER;  Surgeon: Reece Packer, MD;  Location: [facility];  Service: Urology;  Laterality: N/A; . Incision and drainage of wound N/A 11/12/2014   Procedure: IRRIGATION AND DEBRIDEMENT OF WOUNDS WITH BONE BIOPSY AND SURGICAL PREP ;  Surgeon: Theodoro Kos, DO;  Location: [facility];  Service: Plastics;  Laterality: N/A; . Application of a-cell of back N/A 11/12/2014   Procedure: APPLICATION A CELL AND VAC ;  Surgeon: Theodoro Kos, DO;  Location: [facility];  Service: Plastics;  Laterality: N/A; . Incision and drainage of wound N/A 11/18/2014   Procedure: IRRIGATION AND DEBRIDEMENT OF SACRAL ULCER ONLY WITH PLACEMENT OF A CELL AND VAC/ DRESSING CHANGE TO UPPER BACK AREA.;  Surgeon: Theodoro Kos, DO;  Location: [facility];  Service: Plastics;  Laterality: N/A; . Incision and drainage of wound N/A 11/25/2014   Procedure: IRRIGATION AND DEBRIDEMENT OF SACRAL ULCER AND BACK BURN WITH PLACEMENT OF A-CELL;  Surgeon: Theodoro Kos, DO;  Location: [facility];  Service: Plastics;  Laterality: N/A; . Minor application of wound vac N/A 11/25/2014   Procedure:  WOUND VAC CHANGE;  Surgeon: Theodoro Kos, DO;  Location: [facility];  Service: Plastics;  Laterality: N/A; . Incision and drainage of wound Right 12/08/2014   Procedure: IRRIGATION AND DEBRIDEMENT SACRAL WOUND AND RIGHT ISCHIAL WOUND ;  Surgeon: Theodoro Kos, DO;  Location: [facility];  Service: Plastics;  Laterality: Right; . Application of a-cell of back N/A 12/08/2014   Procedure: APPLICATION OF A-CELL AND WOUND VAC ;  Surgeon: Theodoro Kos, DO;  Location: [facility];  Service: Plastics;  Laterality: N/A; . Tee without cardioversion N/A 05/05/2015  Procedure: TRANSESOPHAGEAL ECHOCARDIOGRAM (TEE);  Surgeon: Lelon Perla, MD;  Location: [facility];  Service: Cardiovascular;  Laterality: N/A; . Incision and drainage of wound Bilateral 05/05/2015   Procedure: IRRIGATION AND DEBRIDEMENT SACRAL ULCER;  Surgeon: Theodoro Kos, DO;  Location: [facility];  Service: Plastics;  Laterality: Bilateral; . Hematoma evacuation N/A 05/05/2015   Procedure: EVACUATION HEMATOMA bedside procedure;  Surgeon: Theodoro Kos, DO;  Location: [facility];  Service: Plastics;  Laterality: N/A; HPI: Other Pertinent Information: 29 y.o. male with history of spinal cord injury status post paraplegia who was recently admitted for MRSA and microaerophilic strep bacteremia with respiratory failure requiring trach; nocturnal vent. Admitted due to AMS. Found to have UTI, hypothermia, hypotension, toxic metabolic encephalopathy. In August 2016 pt seen for in-line Passy-Muir Speaking valve, trach'd and continued with PMSV. MBS 05/2015 recommended NPO, received PEG. MBS 10/7 with recs for dysphagia 2, honey thick secondary  to silent, deep penetration nectars; mild oral phase deficits.  Pt has been tolerating current diet with valve in place for all POs.  No Data Recorded Assessment / Plan / Recommendation CHL IP CLINICAL IMPRESSIONS 07/20/2015 Therapy Diagnosis WFL Clinical Impression Repeat MBS completed with PMV in place.  Pt with much improved swallow function marked by normal mastication, brisk swallow response, excellent pharyngeal clearance of all POs, and only transient, high penetration of thin liquids.  Despite successive, large liquid boluses consumed simultaneously with solids, pt protected his airway reliably.  Recommend initiating  a regular diet, thin liquids.  PMV must be in place for all PO consumption.     CHL IP TREATMENT RECOMMENDATION 07/20/2015 Treatment Recommendations Therapy as outlined in treatment plan below   CHL IP DIET RECOMMENDATION 07/20/2015 SLP Diet Recommendations Age appropriate regular solids;Thin Liquid Administration via (None) Medication Administration Whole meds with puree Compensations (None) Postural Changes and/or Swallow Maneuvers (None)   CHL IP OTHER RECOMMENDATIONS 07/20/2015 Recommended Consults (None) Oral Care Recommendations Oral care BID Other Recommendations (None)   CHL IP FOLLOW UP RECOMMENDATIONS 05/27/2015 Follow up Recommendations 24 hour supervision/assistance   CHL IP FREQUENCY AND DURATION 07/20/2015 Speech Therapy Frequency (ACUTE ONLY) min 2x/week Treatment Duration 2 weeks   SLP Swallow Goals No flowsheet data found. No flowsheet data found.   CHL IP REASON FOR REFERRAL 07/20/2015 Reason for Referral Objectively evaluate swallowing function   CHL IP ORAL PHASE 07/20/2015 Lips (None) Tongue (None) Mucous membranes (None) Nutritional status (None) Other (None) Oxygen therapy (None) Oral Phase WFL Oral - Pudding Teaspoon (None) Oral - Pudding Cup (None) Oral - Honey Teaspoon (None) Oral - Honey Cup (None) Oral - Honey Syringe (None) Oral - Nectar Teaspoon (None) Oral - Nectar  Cup (None) Oral - Nectar Straw (None) Oral - Nectar Syringe (None) Oral - Ice Chips (None) Oral - Thin Teaspoon (None) Oral - Thin Cup (None) Oral - Thin Straw (None) Oral - Thin Syringe (None) Oral - Puree (None) Oral - Mechanical Soft (None) Oral - Regular (None) Oral - Multi-consistency (None) Oral - Pill (None) Oral Phase - Comment (None)   CHL IP PHARYNGEAL PHASE 07/20/2015 Pharyngeal Phase WFL Pharyngeal - Pudding Teaspoon (None) Penetration/Aspiration details (pudding teaspoon) (None) Pharyngeal - Pudding Cup (None) Penetration/Aspiration details (pudding cup) (None) Pharyngeal - Honey Teaspoon (None) Penetration/Aspiration details (honey teaspoon) (None) Pharyngeal - Honey Cup (None) Penetration/Aspiration details (honey cup) (None) Pharyngeal - Honey Syringe (None) Penetration/Aspiration details (honey syringe) (None) Pharyngeal - Nectar Teaspoon (None) Penetration/Aspiration details (nectar teaspoon) (None) Pharyngeal - Nectar Cup (None) Penetration/Aspiration details (nectar cup) (None) Pharyngeal -  Nectar Straw (None) Penetration/Aspiration details (nectar straw) (None) Pharyngeal - Nectar Syringe (None) Penetration/Aspiration details (nectar syringe) (None) Pharyngeal - Ice Chips (None) Penetration/Aspiration details (ice chips) (None) Pharyngeal - Thin Teaspoon (None) Penetration/Aspiration details (thin teaspoon) (None) Pharyngeal - Thin Cup (None) Penetration/Aspiration details (thin cup) (None) Pharyngeal - Thin Straw (None) Penetration/Aspiration details (thin straw) (None) Pharyngeal - Thin Syringe (None) Penetration/Aspiration details (thin syringe') (None) Pharyngeal - Puree (None) Penetration/Aspiration details (puree) (None) Pharyngeal - Mechanical Soft (None) Penetration/Aspiration details (mechanical soft) (None) Pharyngeal - Regular (None) Penetration/Aspiration details (regular) (None) Pharyngeal - Multi-consistency (None) Penetration/Aspiration details (multi-consistency) (None)  Pharyngeal - Pill (None) Penetration/Aspiration details (pill) (None) Pharyngeal Comment (None)   CHL IP CERVICAL ESOPHAGEAL PHASE 07/15/2015 Cervical Esophageal Phase WFL Pudding Teaspoon (None) Pudding Cup (None) Honey Teaspoon (None) Honey Cup (None) Honey Straw (None) Nectar Teaspoon (None) Nectar Cup (None) Nectar Straw (None) Nectar Sippy Cup (None) Thin Teaspoon (None) Thin Cup (None) Thin Straw (None) Thin Sippy Cup (None) Cervical Esophageal Comment (None) No flowsheet data found.   Juan Quam Laurice 07/20/2015, 2:37 PM    Microbiology: Recent Results (from the past 240 hour(s))  Culture, Urine     Status: None   Collection Time: 08/10/15 10:59 AM  Result Value Ref Range Status   Specimen Description URINE, SUPRAPUBIC  Final   Special Requests NONE  Final   Culture MULTIPLE SPECIES PRESENT, SUGGEST RECOLLECTION  Final   Report Status 08/12/2015 FINAL  Final  Culture, blood (single)     Status: None (Preliminary result)   Collection Time: 08/13/15 11:45 AM  Result Value Ref Range Status   Specimen Description BLOOD LEFT HAND  Final   Special Requests BOTTLES DRAWN AEROBIC ONLY 5CC  Final   Culture NO GROWTH 2 DAYS  Final   Report Status PENDING  Incomplete  C difficile quick scan w PCR reflex     Status: Abnormal   Collection Time: 08/13/15  9:54 PM  Result Value Ref Range Status   C Diff antigen POSITIVE (A) NEGATIVE Final   C Diff toxin NEGATIVE NEGATIVE Final   C Diff interpretation   Final    C. difficile present, but toxin not detected. This indicates colonization. In most cases, this does not require treatment. If patient has signs and symptoms consistent with colitis, consider treatment. Requires ENTERIC precautions.     Labs: Basic Metabolic Panel:  Recent Labs Lab 08/10/15 0500  08/12/15 0522 08/13/15 0435 08/14/15 0435 08/15/15 0500 08/16/15 0526  NA 132*  < > 130* 133* 132* 129* 135  K 4.7  < > 5.1 5.1 4.6 4.9 5.0  CL 97*  < > 98* 100* 99* 98* 100*   CO2 28  < > 25 25 25 24 27   GLUCOSE 170*  < > 271* 285* 203* 305* 92  BUN 10  < > 9 10 10 11 9   CREATININE 0.58*  < > 0.69 0.76 0.69 0.75 0.75  CALCIUM 8.8*  < > 8.7* 9.1 9.3 9.4 10.4*  MG 1.5*  --   --   --   --   --   --   < > = values in this interval not displayed. Liver Function Tests: No results for input(s): AST, ALT, ALKPHOS, BILITOT, PROT, ALBUMIN in the last 168 hours. No results for input(s): LIPASE, AMYLASE in the last 168 hours. No results for input(s): AMMONIA in the last 168 hours. CBC:  Recent Labs Lab 08/12/15 0522 08/13/15 0435 08/13/15 0911 08/14/15 0435 08/15/15 0500 08/16/15 6812  WBC 28.4* 29.3*  --  27.0* 20.8* 18.4*  NEUTROABS  --   --  20.7*  --   --   --   HGB 7.1* 9.8*  --  9.2* 9.1* 9.5*  HCT 23.3* 30.3*  --  29.7* 29.0* 30.8*  MCV 84.7 83.2  --  84.6 85.0 85.3  PLT 773* 824*  --  783* 783* 841*   Cardiac Enzymes: No results for input(s): CKTOTAL, CKMB, CKMBINDEX, TROPONINI in the last 168 hours. BNP: BNP (last 3 results) No results for input(s): BNP in the last 8760 hours.  ProBNP (last 3 results) No results for input(s): PROBNP in the last 8760 hours.  CBG:  Recent Labs Lab 08/15/15 1716 08/15/15 2027 08/16/15 0013 08/16/15 0432 08/16/15 0759  GLUCAP 223* 91 92 83 106*    Time coordinating discharge: 35 minutes  Signed:  Felisia Balcom  [facility] 08/16/2015, 11:54 AM

## 2015-08-16 NOTE — Clinical Social Work Note (Signed)
Patient to be d/c'ed to home.  Patient and family agreeable to plans will transport via ems [facility]AR has been scheduled.  Evette Cristal, MSW, [place]

## 2015-08-16 NOTE — Care Management Note (Signed)
Case Management Note  Patient Details  Name: Jason [name] MRN: [id number] Date of Birth: [birth year]  Subjective/Objective:                    Action/Plan:  UR updated  Expected Discharge Date:                  Expected Discharge Plan:  Jason [name]  In-House Referral:     Discharge planning Services  CM Consult  Post Acute Care Choice:    Choice offered to:     DME Arranged:    DME Agency:     HH Arranged:  RN Wilkinson Agency:  [facility]  Status of Service:  In process, will continue to follow  Medicare Important Message Given:    Date Medicare IM Given:    Medicare IM give by:    Date Additional Medicare IM Given:    Additional Medicare Important Message give by:     If discussed at [place] of Stay Meetings, dates discussed:  08-16-15   Additional Comments:  Marilu Favre, RN 08/16/2015, 8:08 AM

## 2015-08-16 NOTE — Progress Notes (Signed)
Nutrition Follow-up  DOCUMENTATION CODES:   Underweight, Severe malnutrition in context of chronic illness  INTERVENTION:    Continue MVI  Continue regular diet  Continue Vital AF 1.0 formula at 60 ml/hr from 6 PM to 6 AM. Total time is 12 hours.  NUTRITION DIAGNOSIS:   Inadequate oral intake related to chronic illness as evidenced by  (PO intake </= 50%).  Progressing   GOAL:   Patient will meet greater than or equal to 90% of their needs  Progressing  MONITOR:   TF tolerance, PO intake, Labs, Weight trends, Skin, I & O's  REASON FOR ASSESSMENT:   Consult Calorie Count  ASSESSMENT:   [name] with PMH of spinal cord injury s/p paraplegia who was recently admitted for MRSA and microaerophilic strep bacteremia with respiratory failure requiring trach (vent at night); brought to ER after patient was found to have a hallucinations and confusion. Patient was recently tapered off his IV antibiotics and was placed on doxycycline and Cipro was started 3 days ago for possible UTI. In ER patient UA showed features concerning for UTI and patient has chronic suprapubic catheter. Patient has been admitted for possible developing sepsis.   S/p Procedure(s) (LRB) on 08/04/15: IRRIGATION AND DEBRIDEMENT OF SACRAL ULCER AND (N/A) APPLICATION OF A-CELL (N/A)  ID continues to follow; continues to adjust antibiotic regimen due to infections. Per MD, having ongoing conversations with mother and patient about not responding to last-line antibiotics.   He remains on a regular diet; PO: 95-100%. Pt continues to experience hyperglycemia, however, suspect this is multifactorial. Spoke with RN, who reports blood sugars are improving.   Pt remains on nocturnal feeding regimen of Vital AF 1.0 via PEG (6 PM to 6 AM) providing 720 kcals, 29 gm protein, 606 ml of free water. Per RN, continues to tolerate TF well.  RNCM continues to follow for home health needs. Per MD notes, plan for d/c  home today.   Labs reviewed: CBGS: 91-106 (improving).   Diet Order:  Diet regular Room service appropriate?: Yes; Fluid consistency:: Thin  Skin:   Left plantar heel with stage 2 pressure injury Left plantar foot near heel with unstageable pressure injury Left anterior foot near small toe stage 2 pressure injury  Left outer foot stage 2 pressure injury  Right ischium with stage 4 pressure injury Left ischium with stage 4 pressure injury Left hip with stage 4 pressure injury Left posterior back with chronic full thickness wound  Last BM:  08/15/15  Height:   Ht Readings from Last 1 Encounters:  07/13/15 5\' 7"  (1.702 m)    Weight:   Wt Readings from Last 1 Encounters:  08/16/15 128 lb 9.5 oz (58.33 kg)    Ideal Body Weight:  70 kg  BMI:  Body mass index is 20.14 kg/(m^2).  Estimated Nutritional Needs:   Kcal:  1900-2100  Protein:  95-110  Fluid:  >1.9 L  EDUCATION NEEDS:   No education needs identified at this time  Demetric Dunnaway [name], RD, LDN, CDE Pager: [phone] After hours Pager: [phone]

## 2015-08-17 ENCOUNTER — Ambulatory Visit: Payer: Self-pay | Admitting: Infectious Disease

## 2015-08-17 ENCOUNTER — Telehealth: Payer: Self-pay

## 2015-08-17 LAB — URINE CULTURE
Culture: >=100000
Special Requests: NORMAL

## 2015-08-17 NOTE — Telephone Encounter (Signed)
Spoke with pt's mother, requesting hfu to evaluate if trach is still needed.  Pt had trach placed in August and has not seen MR since.  Offered appt with TP, pt's mother declined.  Pt's mother requesting to see MR only.  MR's next ov 10/20/2015.  Pt's mother does not wish to wait that long.  Elise/MR please advise if pt can be worked in on schedule.  Thanks!

## 2015-08-18 ENCOUNTER — Telehealth: Payer: Self-pay | Admitting: *Deleted

## 2015-08-18 ENCOUNTER — Telehealth: Payer: Self-pay

## 2015-08-18 ENCOUNTER — Telehealth: Payer: Self-pay | Admitting: Family Medicine

## 2015-08-18 LAB — CULTURE, BLOOD (SINGLE): Culture: NO GROWTH

## 2015-08-18 NOTE — Telephone Encounter (Signed)
Pt was discharge from hospital and haley need to clarification orders. Please verify feeding order. Pt is eating orally during the day and  At night Glucerna 66 ml's x 12 hours per mom. What are the flush orders for med and meals. Hildred Alamin will call ID for doxycycline verification.

## 2015-08-18 NOTE — Telephone Encounter (Signed)
Call in orders to flush feeding tube with 30 ml of water before and after night time feedings.

## 2015-08-18 NOTE — Telephone Encounter (Signed)
Called and spoke with pt's mother. Reviewed MR's recs. Pt's mother voiced understanding and had no further questions. Nothing further needed.

## 2015-08-18 NOTE — Telephone Encounter (Signed)
Yes, Jason [name] discussed with them in Sept to continue with doxy for the osteo and I would agree probably to do that for about 3-6 months from when he finished the osteo treatment in September (through December or so).  thanks

## 2015-08-18 NOTE — Telephone Encounter (Signed)
Received a request from [facility] for Lyrica 100 mg capsule- Take 1 capsule by mouth three times daily (morning, afternoon, and evening) and take 2 capsules at bedtime.  The last time this medication was sent it was by the hospitalist on 8.24.2016 for #150 with 4 refills.

## 2015-08-18 NOTE — Telephone Encounter (Signed)
Best he see Laurey Arrow babcock at [facility]

## 2015-08-18 NOTE — Telephone Encounter (Signed)
I spoke with Hildred Alamin and went over below information.

## 2015-08-18 NOTE — Telephone Encounter (Signed)
Call this same order in for Lyrica, #150 with 5 rf

## 2015-08-18 NOTE — Telephone Encounter (Signed)
Hailey the nurse with [facility] called to advise that the patient gets his last does of IV antibiotics tomorrow 08/19/15. She wants to know if they can D/C the PICC and if the patient should be on oral antibiotics after the IV stops. Verified the patient follow up appt with them for 09/13/15. Hailey advised the patient thought his appt was 08/22/15 as it says so on his discharge summary. Advised we have patient coming in 09/13/15 and verified with the doctor that is ok. Advised her will ask the doctor to look at the patient medication and labs and advise if oral antibitoce are ok or what he wants. Given [phone] as a call back number for Hailey.

## 2015-08-19 ENCOUNTER — Telehealth: Payer: Self-pay | Admitting: Family Medicine

## 2015-08-19 ENCOUNTER — Other Ambulatory Visit: Payer: Self-pay | Admitting: *Deleted

## 2015-08-19 DIAGNOSIS — E1065 Type 1 diabetes mellitus with hyperglycemia: Secondary | ICD-10-CM

## 2015-08-19 DIAGNOSIS — L89159 Pressure ulcer of sacral region, unspecified stage: Secondary | ICD-10-CM

## 2015-08-19 DIAGNOSIS — G8250 Quadriplegia, unspecified: Secondary | ICD-10-CM

## 2015-08-19 DIAGNOSIS — E1040 Type 1 diabetes mellitus with diabetic neuropathy, unspecified: Secondary | ICD-10-CM

## 2015-08-19 DIAGNOSIS — IMO0002 Reserved for concepts with insufficient information to code with codable children: Secondary | ICD-10-CM

## 2015-08-19 DIAGNOSIS — G9511 Acute infarction of spinal cord (embolic) (nonembolic): Secondary | ICD-10-CM

## 2015-08-19 MED ORDER — DOXYCYCLINE HYCLATE 100 MG PO TABS: 100.0000 mg | ORAL_TABLET | Freq: Two times a day (BID) | ORAL | Status: DC

## 2015-08-19 MED ORDER — PREGABALIN 100 MG PO CAPS: 100.0000 mg | ORAL_CAPSULE | Freq: Three times a day (TID) | ORAL | Status: DC

## 2015-08-19 NOTE — Telephone Encounter (Signed)
I spoke with Jason [name] ( home care nurse ) and per Dr. Leanord Hawking should be taken every day, this is not a prn medication. Also request for Novolog should go to diabetic doctor. I went over all of this with Jason [name] and she will go over this information with pt's mom.

## 2015-08-19 NOTE — Telephone Encounter (Signed)
I printed script for Lyrica and faxed to [place].

## 2015-08-19 NOTE — Telephone Encounter (Signed)
Yes, picc should definitely be removed, doxy 100 mg bid until seen by me.  thanks

## 2015-08-19 NOTE — Telephone Encounter (Signed)
Pt needs refill pregabalin (LYRICA) 100 MG capsule mirtazapine (REMERON) 7.5 MG tablet insulin aspart (NOVOLOG FLEXPEN) 100 UNIT/ML FlexPen  Pt having side effects from the [place] and nurse would like to request PRN. She will discuss with you on call back.  Pt has a history of "bottoming out" on the [place] and would like to request PRN as well  [place] family pharm/[address]

## 2015-08-19 NOTE — Telephone Encounter (Signed)
Patient and Hailey notified to D/C the PICC and verified pharmacy info to send in the Rx for Doxy and follow up visit ok per Dr Linus Salmons.

## 2015-08-19 NOTE — Telephone Encounter (Signed)
Patient needs Rx for the Doxy, please dose.

## 2015-08-19 NOTE — Telephone Encounter (Signed)
I printed and faxed script to [facility].

## 2015-08-22 DIAGNOSIS — J9690 Respiratory failure, unspecified, unspecified whether with hypoxia or hypercapnia: Secondary | ICD-10-CM | POA: Diagnosis not present

## 2015-08-23 ENCOUNTER — Telehealth: Payer: Self-pay | Admitting: *Deleted

## 2015-08-23 ENCOUNTER — Telehealth: Payer: Self-pay | Admitting: Family Medicine

## 2015-08-23 NOTE — Telephone Encounter (Signed)
Patient mother called and advised that since the patient started taking the Doxy last week he has been confused and difficult to handel as well as other behavior changes. She advised this happened before on Doxy and wants to know if there is anything else he could take. Advised her will ask the doctor and give her a call back.

## 2015-08-23 NOTE — Telephone Encounter (Signed)
[facility] - Client [place] Call Center Patient Name: Jason [name] DOB: June 07, 1986 Initial Comment Caller states she is a home health nurse, patients blood pressure was 88/46, on blood pressure rx (31ml). [phone] Nurse Assessment Nurse: Markus Daft, RN, Sherre Poot Date/Time (Eastern Time): 08/23/2015 3:24:40 PM Confirm and document reason for call. If symptomatic, describe symptoms. ---Caller states she is a home health nurse, patients BP is 88/46, on Midodrine to help raise his BP at 12 pm. Prior to med BP, 79/49. He has been lethargic (RN clarified and pt was awake and sometimes it was hard to wake him up) - dosing off/on til about 2:15 pm. He slept last night also. He has nurse around the clock for this week. He is paraplegic. Normally he ranges in low 100's and has been as low as 50's. Has the patient traveled out of the country within the last 30 days? ---Not Applicable Does the patient have any new or worsening symptoms? ---No Please document clinical information provided and list any resource used. ---No triage. Nurse will speak with Dr. Sarajane Jews or his nurse about assessment and advice for adjusting his meds. Guidelines Guideline Title Affirmed Question Affirmed Notes Final Disposition User Clinical Call [place], RN, Windy Warm conf. Nurse to [facility] staff to peak with MD.

## 2015-08-23 NOTE — Telephone Encounter (Signed)
Please advise 

## 2015-08-23 NOTE — Telephone Encounter (Signed)
This was answered in the other phone note

## 2015-08-23 NOTE — Telephone Encounter (Signed)
Home Health Aide called regarding this patient blood pressure medicine. midodrine (PROAMATINE) 10 MG table They wanted to know if they could up this patient blood pressure medicine. The caller ([place]) wanted to switched to [facility]. Home Health Aide was transferred to [facility] voice mail.

## 2015-08-23 NOTE — Telephone Encounter (Signed)
Called patient mom and advised her to stop the Doxy per Dr Linus Salmons and we will reassess him at visit 09/13/15. Advised her to call the office asap if the patient gets a fever or seems to get worse before the follow up visit.

## 2015-08-23 NOTE — Telephone Encounter (Signed)
I would just stop the doxy and continue without antibiotics.  Not much else left orally and not sure he needs it. thanks

## 2015-08-23 NOTE — Telephone Encounter (Signed)
Increase the Midodrine to 40 mg TID

## 2015-08-23 NOTE — Telephone Encounter (Signed)
I spoke with nurse and mom, gave new directions and updated medication change.

## 2015-08-23 NOTE — Addendum Note (Signed)
Addended by: Aggie Hacker A on: 08/23/2015 04:24 PM   Modules accepted: Medications

## 2015-08-24 ENCOUNTER — Ambulatory Visit (HOSPITAL_COMMUNITY): Payer: Self-pay

## 2015-08-24 ENCOUNTER — Inpatient Hospital Stay (HOSPITAL_COMMUNITY): Admission: RE | Admit: 2015-08-24 | Payer: Self-pay | Source: Ambulatory Visit

## 2015-08-25 ENCOUNTER — Inpatient Hospital Stay (HOSPITAL_COMMUNITY)
Admission: EM | Admit: 2015-08-25 | Discharge: 2015-09-02 | DRG: 871 | Disposition: A | Discharge status: Home Health | Payer: Medicaid Other | Attending: Internal Medicine | Admitting: Internal Medicine

## 2015-08-25 ENCOUNTER — Encounter (HOSPITAL_COMMUNITY): Payer: Self-pay | Admitting: Emergency Medicine

## 2015-08-25 ENCOUNTER — Emergency Department (HOSPITAL_COMMUNITY): Payer: Medicaid Other

## 2015-08-25 ENCOUNTER — Encounter: Payer: Self-pay | Admitting: Infectious Disease

## 2015-08-25 DIAGNOSIS — Z833 Family history of diabetes mellitus: Secondary | ICD-10-CM | POA: Diagnosis not present

## 2015-08-25 DIAGNOSIS — Z881 Allergy status to other antibiotic agents status: Secondary | ICD-10-CM

## 2015-08-25 DIAGNOSIS — R1314 Dysphagia, pharyngoesophageal phase: Secondary | ICD-10-CM | POA: Diagnosis present

## 2015-08-25 DIAGNOSIS — R64 Cachexia: Secondary | ICD-10-CM | POA: Diagnosis present

## 2015-08-25 DIAGNOSIS — N179 Acute kidney failure, unspecified: Secondary | ICD-10-CM | POA: Diagnosis not present

## 2015-08-25 DIAGNOSIS — E1043 Type 1 diabetes mellitus with diabetic autonomic (poly)neuropathy: Secondary | ICD-10-CM | POA: Diagnosis present

## 2015-08-25 DIAGNOSIS — E1143 Type 2 diabetes mellitus with diabetic autonomic (poly)neuropathy: Secondary | ICD-10-CM | POA: Diagnosis present

## 2015-08-25 DIAGNOSIS — L89154 Pressure ulcer of sacral region, stage 4: Secondary | ICD-10-CM | POA: Diagnosis present

## 2015-08-25 DIAGNOSIS — Z681 Body mass index (BMI) 19 or less, adult: Secondary | ICD-10-CM

## 2015-08-25 DIAGNOSIS — Z933 Colostomy status: Secondary | ICD-10-CM | POA: Diagnosis not present

## 2015-08-25 DIAGNOSIS — E43 Unspecified severe protein-calorie malnutrition: Secondary | ICD-10-CM | POA: Diagnosis present

## 2015-08-25 DIAGNOSIS — S14109S Unspecified injury at unspecified level of cervical spinal cord, sequela: Secondary | ICD-10-CM

## 2015-08-25 DIAGNOSIS — R6520 Severe sepsis without septic shock: Secondary | ICD-10-CM | POA: Diagnosis present

## 2015-08-25 DIAGNOSIS — Z8249 Family history of ischemic heart disease and other diseases of the circulatory system: Secondary | ICD-10-CM | POA: Diagnosis not present

## 2015-08-25 DIAGNOSIS — Z79899 Other long term (current) drug therapy: Secondary | ICD-10-CM

## 2015-08-25 DIAGNOSIS — K219 Gastro-esophageal reflux disease without esophagitis: Secondary | ICD-10-CM | POA: Diagnosis present

## 2015-08-25 DIAGNOSIS — T360X5A Adverse effect of penicillins, initial encounter: Secondary | ICD-10-CM | POA: Diagnosis not present

## 2015-08-25 DIAGNOSIS — J9622 Acute and chronic respiratory failure with hypercapnia: Secondary | ICD-10-CM

## 2015-08-25 DIAGNOSIS — G894 Chronic pain syndrome: Secondary | ICD-10-CM | POA: Diagnosis present

## 2015-08-25 DIAGNOSIS — Z794 Long term (current) use of insulin: Secondary | ICD-10-CM

## 2015-08-25 DIAGNOSIS — Z993 Dependence on wheelchair: Secondary | ICD-10-CM | POA: Diagnosis not present

## 2015-08-25 DIAGNOSIS — R6510 Systemic inflammatory response syndrome (SIRS) of non-infectious origin without acute organ dysfunction: Secondary | ICD-10-CM | POA: Diagnosis not present

## 2015-08-25 DIAGNOSIS — E1065 Type 1 diabetes mellitus with hyperglycemia: Secondary | ICD-10-CM | POA: Diagnosis present

## 2015-08-25 DIAGNOSIS — L89894 Pressure ulcer of other site, stage 4: Secondary | ICD-10-CM | POA: Diagnosis present

## 2015-08-25 DIAGNOSIS — Y95 Nosocomial condition: Secondary | ICD-10-CM | POA: Diagnosis present

## 2015-08-25 DIAGNOSIS — L89314 Pressure ulcer of right buttock, stage 4: Secondary | ICD-10-CM | POA: Diagnosis present

## 2015-08-25 DIAGNOSIS — J9620 Acute and chronic respiratory failure, unspecified whether with hypoxia or hypercapnia: Secondary | ICD-10-CM | POA: Diagnosis present

## 2015-08-25 DIAGNOSIS — Z886 Allergy status to analgesic agent status: Secondary | ICD-10-CM

## 2015-08-25 DIAGNOSIS — D721 Eosinophilia, unspecified: Secondary | ICD-10-CM | POA: Insufficient documentation

## 2015-08-25 DIAGNOSIS — Z885 Allergy status to narcotic agent status: Secondary | ICD-10-CM | POA: Diagnosis not present

## 2015-08-25 DIAGNOSIS — E871 Hypo-osmolality and hyponatremia: Secondary | ICD-10-CM | POA: Diagnosis present

## 2015-08-25 DIAGNOSIS — G8250 Quadriplegia, unspecified: Secondary | ICD-10-CM | POA: Diagnosis present

## 2015-08-25 DIAGNOSIS — G9340 Encephalopathy, unspecified: Secondary | ICD-10-CM | POA: Diagnosis not present

## 2015-08-25 DIAGNOSIS — Z91013 Allergy to seafood: Secondary | ICD-10-CM

## 2015-08-25 DIAGNOSIS — R443 Hallucinations, unspecified: Secondary | ICD-10-CM | POA: Diagnosis not present

## 2015-08-25 DIAGNOSIS — Z7401 Bed confinement status: Secondary | ICD-10-CM

## 2015-08-25 DIAGNOSIS — M797 Fibromyalgia: Secondary | ICD-10-CM | POA: Diagnosis present

## 2015-08-25 DIAGNOSIS — Z888 Allergy status to other drugs, medicaments and biological substances status: Secondary | ICD-10-CM | POA: Diagnosis not present

## 2015-08-25 DIAGNOSIS — Z930 Tracheostomy status: Secondary | ICD-10-CM | POA: Diagnosis not present

## 2015-08-25 DIAGNOSIS — Z791 Long term (current) use of non-steroidal anti-inflammatories (NSAID): Secondary | ICD-10-CM

## 2015-08-25 DIAGNOSIS — K3184 Gastroparesis: Secondary | ICD-10-CM | POA: Diagnosis present

## 2015-08-25 DIAGNOSIS — L299 Pruritus, unspecified: Secondary | ICD-10-CM | POA: Diagnosis not present

## 2015-08-25 DIAGNOSIS — R509 Fever, unspecified: Secondary | ICD-10-CM | POA: Insufficient documentation

## 2015-08-25 DIAGNOSIS — N319 Neuromuscular dysfunction of bladder, unspecified: Secondary | ICD-10-CM | POA: Diagnosis present

## 2015-08-25 DIAGNOSIS — J189 Pneumonia, unspecified organism: Secondary | ICD-10-CM | POA: Diagnosis not present

## 2015-08-25 DIAGNOSIS — J690 Pneumonitis due to inhalation of food and vomit: Secondary | ICD-10-CM | POA: Diagnosis present

## 2015-08-25 DIAGNOSIS — E10621 Type 1 diabetes mellitus with foot ulcer: Secondary | ICD-10-CM | POA: Diagnosis not present

## 2015-08-25 DIAGNOSIS — Z86711 Personal history of pulmonary embolism: Secondary | ICD-10-CM | POA: Diagnosis not present

## 2015-08-25 DIAGNOSIS — J9621 Acute and chronic respiratory failure with hypoxia: Secondary | ICD-10-CM | POA: Diagnosis not present

## 2015-08-25 DIAGNOSIS — E860 Dehydration: Secondary | ICD-10-CM | POA: Diagnosis present

## 2015-08-25 DIAGNOSIS — E10649 Type 1 diabetes mellitus with hypoglycemia without coma: Secondary | ICD-10-CM | POA: Diagnosis present

## 2015-08-25 DIAGNOSIS — Z931 Gastrostomy status: Secondary | ICD-10-CM

## 2015-08-25 DIAGNOSIS — E1042 Type 1 diabetes mellitus with diabetic polyneuropathy: Secondary | ICD-10-CM | POA: Diagnosis present

## 2015-08-25 DIAGNOSIS — Z87891 Personal history of nicotine dependence: Secondary | ICD-10-CM

## 2015-08-25 DIAGNOSIS — Z823 Family history of stroke: Secondary | ICD-10-CM

## 2015-08-25 DIAGNOSIS — L97529 Non-pressure chronic ulcer of other part of left foot with unspecified severity: Secondary | ICD-10-CM

## 2015-08-25 DIAGNOSIS — L89324 Pressure ulcer of left buttock, stage 4: Secondary | ICD-10-CM | POA: Diagnosis present

## 2015-08-25 DIAGNOSIS — D649 Anemia, unspecified: Secondary | ICD-10-CM | POA: Diagnosis present

## 2015-08-25 DIAGNOSIS — Z9359 Other cystostomy status: Secondary | ICD-10-CM

## 2015-08-25 DIAGNOSIS — Z825 Family history of asthma and other chronic lower respiratory diseases: Secondary | ICD-10-CM

## 2015-08-25 DIAGNOSIS — J45909 Unspecified asthma, uncomplicated: Secondary | ICD-10-CM | POA: Diagnosis present

## 2015-08-25 DIAGNOSIS — Z8614 Personal history of Methicillin resistant Staphylococcus aureus infection: Secondary | ICD-10-CM | POA: Diagnosis not present

## 2015-08-25 DIAGNOSIS — A419 Sepsis, unspecified organism: Secondary | ICD-10-CM | POA: Diagnosis present

## 2015-08-25 DIAGNOSIS — L97519 Non-pressure chronic ulcer of other part of right foot with unspecified severity: Secondary | ICD-10-CM

## 2015-08-25 DIAGNOSIS — L89623 Pressure ulcer of left heel, stage 3: Secondary | ICD-10-CM | POA: Diagnosis present

## 2015-08-25 DIAGNOSIS — Z880 Allergy status to penicillin: Secondary | ICD-10-CM

## 2015-08-25 LAB — CBC WITH DIFFERENTIAL/PLATELET
Basophils Absolute: 0 10*3/uL (ref 0.0–0.1)
Basophils Relative: 0 %
Eosinophils Absolute: 0.6 10*3/uL (ref 0.0–0.7)
Eosinophils Relative: 2 %
HCT: 34.7 % — ABNORMAL LOW (ref 39.0–52.0)
Hemoglobin: 10.8 g/dL — ABNORMAL LOW (ref 13.0–17.0)
Lymphocytes Relative: 8 %
Lymphs Abs: 2.3 10*3/uL (ref 0.7–4.0)
MCH: 26.8 pg (ref 26.0–34.0)
MCHC: 31.1 g/dL (ref 30.0–36.0)
MCV: 86.1 fL (ref 78.0–100.0)
Monocytes Absolute: 2.6 10*3/uL — ABNORMAL HIGH (ref 0.1–1.0)
Monocytes Relative: 9 %
Neutro Abs: 23.4 10*3/uL — ABNORMAL HIGH (ref 1.7–7.7)
Neutrophils Relative %: 81 %
Platelets: 797 10*3/uL — ABNORMAL HIGH (ref 150–400)
RBC: 4.03 MIL/uL — ABNORMAL LOW (ref 4.22–5.81)
RDW: 14.2 % (ref 11.5–15.5)
WBC: 28.9 10*3/uL — ABNORMAL HIGH (ref 4.0–10.5)

## 2015-08-25 LAB — GLUCOSE, CAPILLARY
GLUCOSE-CAPILLARY: 166 mg/dL — AB (ref 65–99)
Glucose-Capillary: 217 mg/dL — ABNORMAL HIGH (ref 65–99)

## 2015-08-25 LAB — URINE MICROSCOPIC-ADD ON

## 2015-08-25 LAB — URINALYSIS, ROUTINE W REFLEX MICROSCOPIC
Bilirubin Urine: NEGATIVE
Glucose, UA: 100 mg/dL — AB
Ketones, ur: NEGATIVE mg/dL
Nitrite: POSITIVE — AB
Protein, ur: 100 mg/dL — AB
Specific Gravity, Urine: 1.02 (ref 1.005–1.030)
pH: 6 (ref 5.0–8.0)

## 2015-08-25 LAB — COMPREHENSIVE METABOLIC PANEL
ALT: 8 U/L — ABNORMAL LOW (ref 17–63)
AST: 11 U/L — ABNORMAL LOW (ref 15–41)
Albumin: 2.3 g/dL — ABNORMAL LOW (ref 3.5–5.0)
Alkaline Phosphatase: 109 U/L (ref 38–126)
Anion gap: 7 (ref 5–15)
BUN: 34 mg/dL — ABNORMAL HIGH (ref 6–20)
CO2: 24 mmol/L (ref 22–32)
Calcium: 14.8 mg/dL (ref 8.9–10.3)
Chloride: 96 mmol/L — ABNORMAL LOW (ref 101–111)
Creatinine, Ser: 1.63 mg/dL — ABNORMAL HIGH (ref 0.61–1.24)
GFR calc Af Amer: >60 mL/min (ref >60)
GFR calc non Af Amer: 55 mL/min — ABNORMAL LOW (ref >60)
Glucose, Bld: 383 mg/dL — ABNORMAL HIGH (ref 65–99)
Potassium: 5 mmol/L (ref 3.5–5.1)
Sodium: 127 mmol/L — ABNORMAL LOW (ref 135–145)
Total Bilirubin: 0.3 mg/dL (ref 0.3–1.2)
Total Protein: 8.8 g/dL — ABNORMAL HIGH (ref 6.5–8.1)

## 2015-08-25 LAB — SEDIMENTATION RATE: Sed Rate: 124 mm/hr — ABNORMAL HIGH (ref 0–16)

## 2015-08-25 LAB — CBG MONITORING, ED: Glucose-Capillary: 301 mg/dL — ABNORMAL HIGH (ref 65–99)

## 2015-08-25 LAB — STREP PNEUMONIAE URINARY ANTIGEN: Strep Pneumo Urinary Antigen: NEGATIVE

## 2015-08-25 LAB — C-REACTIVE PROTEIN: CRP: 16 mg/dL — ABNORMAL HIGH (ref <1.0)

## 2015-08-25 LAB — MRSA PCR SCREENING: MRSA by PCR: NEGATIVE

## 2015-08-25 LAB — I-STAT CG4 LACTIC ACID, ED: LACTIC ACID, VENOUS: 1.28 mmol/L (ref 0.5–2.0)

## 2015-08-25 LAB — CORTISOL: CORTISOL PLASMA: 19.3 ug/dL

## 2015-08-25 MED ORDER — ACETAMINOPHEN 650 MG RE SUPP
650.0000 mg | Freq: Once | RECTAL | Status: AC
  Administered 2015-08-25: 650 mg via RECTAL
  Filled 2015-08-25: qty 1

## 2015-08-25 MED ORDER — SODIUM CHLORIDE 0.9 % IV BOLUS (SEPSIS)
1000.0000 mL | Freq: Once | INTRAVENOUS | Status: AC
Start: 2015-08-25 — End: 2015-08-25
  Administered 2015-08-25: 1000 mL via INTRAVENOUS

## 2015-08-25 MED ORDER — SODIUM CHLORIDE 0.9 % IV SOLN
250.0000 mg | Freq: Three times a day (TID) | INTRAVENOUS | Status: DC
  Administered 2015-08-25 – 2015-08-30 (×16): 250 mg via INTRAVENOUS
  Filled 2015-08-25 (×20): qty 250

## 2015-08-25 MED ORDER — OXYCODONE HCL 5 MG PO TABS
20.0000 mg | ORAL_TABLET | Freq: Four times a day (QID) | ORAL | Status: DC | PRN
  Administered 2015-08-25 – 2015-09-02 (×14): 20 mg via ORAL
  Filled 2015-08-25 (×15): qty 4

## 2015-08-25 MED ORDER — COLLAGENASE 250 UNIT/GM EX OINT
1.0000 "application " | TOPICAL_OINTMENT | CUTANEOUS | Status: DC
  Administered 2015-08-26 – 2015-09-02 (×2): 1 via TOPICAL
  Filled 2015-08-25: qty 30

## 2015-08-25 MED ORDER — GLUCERNA 1.2 CAL PO LIQD
1000.0000 mL | ORAL | Status: DC
  Administered 2015-08-25 – 2015-08-26 (×2): 1000 mL
  Filled 2015-08-25 (×3): qty 1000

## 2015-08-25 MED ORDER — INSULIN GLARGINE 100 UNIT/ML ~~LOC~~ SOLN
10.0000 [IU] | Freq: Every day | SUBCUTANEOUS | Status: DC
  Administered 2015-08-25 – 2015-09-02 (×9): 10 [IU] via SUBCUTANEOUS
  Filled 2015-08-25 (×11): qty 0.1

## 2015-08-25 MED ORDER — SACCHAROMYCES BOULARDII 250 MG PO CAPS
250.0000 mg | ORAL_CAPSULE | Freq: Two times a day (BID) | ORAL | Status: DC
  Administered 2015-08-25 – 2015-09-02 (×16): 250 mg via ORAL
  Filled 2015-08-25 (×15): qty 1

## 2015-08-25 MED ORDER — SODIUM CHLORIDE 0.9 % IV SOLN
INTRAVENOUS | Status: DC
  Administered 2015-08-25 – 2015-08-29 (×6): via INTRAVENOUS

## 2015-08-25 MED ORDER — SODIUM CHLORIDE 0.9 % IV SOLN
500.0000 mg | INTRAVENOUS | Status: DC
  Administered 2015-08-25 – 2015-08-26 (×2): 500 mg via INTRAVENOUS
  Filled 2015-08-25 (×2): qty 500

## 2015-08-25 MED ORDER — ADULT MULTIVITAMIN W/MINERALS CH
1.0000 | ORAL_TABLET | Freq: Every day | ORAL | Status: DC
  Administered 2015-08-25 – 2015-09-02 (×9): 1
  Filled 2015-08-25 (×9): qty 1

## 2015-08-25 MED ORDER — MIRTAZAPINE 15 MG PO TABS: 7.5000 mg | ORAL_TABLET | Freq: Every evening | ORAL | Status: DC | PRN

## 2015-08-25 MED ORDER — FREE WATER
200.0000 mL | Freq: Three times a day (TID) | Status: DC
  Administered 2015-08-25 – 2015-09-02 (×24): 200 mL

## 2015-08-25 MED ORDER — FENTANYL 25 MCG/HR TD PT72
50.0000 ug | MEDICATED_PATCH | TRANSDERMAL | Status: DC
  Administered 2015-08-28 – 2015-08-31 (×2): 50 ug via TRANSDERMAL
  Filled 2015-08-25 (×3): qty 2

## 2015-08-25 MED ORDER — PREGABALIN 100 MG PO CAPS
200.0000 mg | ORAL_CAPSULE | Freq: Every day | ORAL | Status: DC
  Administered 2015-08-25 – 2015-09-01 (×8): 200 mg via ORAL
  Filled 2015-08-25 (×8): qty 2

## 2015-08-25 MED ORDER — PREGABALIN 100 MG PO CAPS
100.0000 mg | ORAL_CAPSULE | Freq: Three times a day (TID) | ORAL | Status: DC
  Administered 2015-08-25 – 2015-09-02 (×23): 100 mg
  Filled 2015-08-25 (×24): qty 1

## 2015-08-25 MED ORDER — MAGNESIUM OXIDE 400 (241.3 MG) MG PO TABS
400.0000 mg | ORAL_TABLET | Freq: Two times a day (BID) | ORAL | Status: DC
  Administered 2015-08-25 – 2015-09-02 (×16): 400 mg via ORAL
  Filled 2015-08-25 (×16): qty 1

## 2015-08-25 MED ORDER — ONDANSETRON 8 MG PO TBDP
8.0000 mg | ORAL_TABLET | Freq: Three times a day (TID) | ORAL | Status: DC | PRN
  Administered 2015-08-27: 8 mg
  Filled 2015-08-25 (×4): qty 1

## 2015-08-25 MED ORDER — ENOXAPARIN SODIUM 40 MG/0.4ML ~~LOC~~ SOLN
40.0000 mg | SUBCUTANEOUS | Status: DC
  Administered 2015-08-25 – 2015-09-01 (×8): 40 mg via SUBCUTANEOUS
  Filled 2015-08-25 (×8): qty 0.4

## 2015-08-25 MED ORDER — ALBUTEROL SULFATE (2.5 MG/3ML) 0.083% IN NEBU: 2.5000 mg | INHALATION_SOLUTION | RESPIRATORY_TRACT | Status: DC | PRN

## 2015-08-25 MED ORDER — MIDODRINE HCL 5 MG PO TABS
40.0000 mg | ORAL_TABLET | Freq: Three times a day (TID) | ORAL | Status: DC
  Administered 2015-08-25 – 2015-08-27 (×7): 40 mg
  Filled 2015-08-25 (×7): qty 8

## 2015-08-25 MED ORDER — VANCOMYCIN HCL 500 MG IV SOLR
500.0000 mg | Freq: Once | INTRAVENOUS | Status: AC
  Administered 2015-08-25: 500 mg via INTRAVENOUS
  Filled 2015-08-25: qty 500

## 2015-08-25 MED ORDER — FENTANYL CITRATE (PF) 100 MCG/2ML IJ SOLN: 50.0000 ug | INTRAMUSCULAR | Status: DC | PRN

## 2015-08-25 MED ORDER — LORAZEPAM 0.5 MG PO TABS
0.5000 mg | ORAL_TABLET | Freq: Three times a day (TID) | ORAL | Status: DC | PRN
  Administered 2015-08-28: 0.5 mg via ORAL
  Filled 2015-08-25: qty 1

## 2015-08-25 MED ORDER — SODIUM CHLORIDE 0.9 % IV BOLUS (SEPSIS)
1000.0000 mL | INTRAVENOUS | Status: AC
  Administered 2015-08-25 (×2): 1000 mL via INTRAVENOUS

## 2015-08-25 MED ORDER — IPRATROPIUM-ALBUTEROL 0.5-2.5 (3) MG/3ML IN SOLN
3.0000 mL | Freq: Once | RESPIRATORY_TRACT | Status: AC
  Administered 2015-08-25: 3 mL via RESPIRATORY_TRACT
  Filled 2015-08-25: qty 3

## 2015-08-25 MED ORDER — WHITE PETROLATUM GEL
Status: AC
  Administered 2015-08-25: 0.2
  Filled 2015-08-25: qty 1

## 2015-08-25 MED ORDER — PANTOPRAZOLE SODIUM 40 MG PO PACK
40.0000 mg | PACK | Freq: Every day | ORAL | Status: DC
  Administered 2015-08-25 – 2015-09-02 (×9): 40 mg
  Filled 2015-08-25 (×7): qty 20

## 2015-08-25 MED ORDER — INSULIN ASPART 100 UNIT/ML ~~LOC~~ SOLN
0.0000 [IU] | SUBCUTANEOUS | Status: DC
  Administered 2015-08-25: 7 [IU] via SUBCUTANEOUS
  Administered 2015-08-25: 15 [IU] via SUBCUTANEOUS
  Administered 2015-08-26 (×2): 3 [IU] via SUBCUTANEOUS
  Administered 2015-08-26 – 2015-08-27 (×2): 4 [IU] via SUBCUTANEOUS
  Administered 2015-08-27: 3 [IU] via SUBCUTANEOUS
  Administered 2015-08-28: 4 [IU] via SUBCUTANEOUS
  Administered 2015-08-28: 3 [IU] via SUBCUTANEOUS
  Administered 2015-08-28: 4 [IU] via SUBCUTANEOUS
  Administered 2015-08-28: 3 [IU] via SUBCUTANEOUS
  Administered 2015-08-29: 4 [IU] via SUBCUTANEOUS
  Administered 2015-08-29: 3 [IU] via SUBCUTANEOUS
  Administered 2015-08-29: 7 [IU] via SUBCUTANEOUS
  Administered 2015-08-29 – 2015-08-30 (×2): 4 [IU] via SUBCUTANEOUS
  Administered 2015-08-30: 7 [IU] via SUBCUTANEOUS
  Administered 2015-08-30: 3 [IU] via SUBCUTANEOUS
  Administered 2015-08-30: 4 [IU] via SUBCUTANEOUS
  Administered 2015-08-30: 3 [IU] via SUBCUTANEOUS
  Administered 2015-08-30: 4 [IU] via SUBCUTANEOUS
  Administered 2015-08-31: 3 [IU] via SUBCUTANEOUS
  Administered 2015-08-31: 7 [IU] via SUBCUTANEOUS
  Administered 2015-08-31 (×2): 11 [IU] via SUBCUTANEOUS
  Administered 2015-08-31: 15 [IU] via SUBCUTANEOUS
  Administered 2015-09-01: 3 [IU] via SUBCUTANEOUS
  Administered 2015-09-01: 11 [IU] via SUBCUTANEOUS
  Administered 2015-09-01 – 2015-09-02 (×3): 4 [IU] via SUBCUTANEOUS
  Filled 2015-08-25: qty 1

## 2015-08-25 NOTE — H&P (Signed)
[facility] and Physical                                                                                    Jason [name], is a 29 y.o. male  MRN: [id number]   DOB - 10-Oct-1985  Admit Date - 08/25/2015  Outpatient Primary MD for the patient is [name], MD  Referring MD: Wilson Singer / ER  Consulting M.D; Baxter Flattery / ID  With History of -  Past Medical History  Diagnosis Date  . GERD (gastroesophageal reflux disease)   . Asthma   . Hx MRSA infection     on face  . Gastroparesis   . Diabetic neuropathy ([place])   . Seizures ([place])   . Family history of anesthesia complication     Pt mother can't have epidural procedures  . Dysrhythmia   . Pneumonia   . Arthritis   . Fibromyalgia   . Stroke ([place]) 01/29/2014    spinal stroke ; "quadriplegic since"  . Type I diabetes mellitus ([place])     sees Dr. Loanne Drilling   . Syncope 02/16/2015      Past Surgical History  Procedure Laterality Date  . Tonsillectomy    . Multiple extractions with alveoloplasty N/A 08/03/2014    Procedure: MULTIPLE EXTRACTIONS;  Surgeon: Gae Bon, DDS;  Location: [place];  Service: Oral Surgery;  Laterality: N/A;  . Tee without cardioversion N/A 08/17/2014    Procedure: TRANSESOPHAGEAL ECHOCARDIOGRAM (TEE);  Surgeon: Dorothy Spark, MD;  Location: [facility] ENDOSCOPY;  Service: Cardiovascular;  Laterality: N/A;  . Debridment of decubitus ulcer N/A 10/04/2014    Procedure: DEBRIDMENT OF DECUBITUS ULCER;  Surgeon: Georganna Skeans, MD;  Location: [place];  Service: General;  Laterality: N/A;  . Laparoscopic diverted colostomy N/A 10/12/2014    Procedure: LAPAROSCOPIC DIVERTING COLOSTOMY;  Surgeon: Donnie Mesa, MD;  Location: [place];  Service: General;  Laterality: N/A;  . Insertion of suprapubic catheter N/A 10/12/2014    Procedure: INSERTION OF SUPRAPUBIC CATHETER;  Surgeon: Reece Packer, MD;  Location: [place];  Service: Urology;  Laterality: N/A;  . Incision and drainage of wound N/A 11/12/2014   Procedure: IRRIGATION AND DEBRIDEMENT OF WOUNDS WITH BONE BIOPSY AND SURGICAL PREP ;  Surgeon: Theodoro Kos, DO;  Location: [place];  Service: Plastics;  Laterality: N/A;  . Application of a-cell of back N/A 11/12/2014    Procedure: APPLICATION A CELL AND VAC ;  Surgeon: Theodoro Kos, DO;  Location: [place];  Service: Plastics;  Laterality: N/A;  . Incision and drainage of wound N/A 11/18/2014    Procedure: IRRIGATION AND DEBRIDEMENT OF SACRAL ULCER ONLY WITH PLACEMENT OF A CELL AND VAC/ DRESSING CHANGE TO UPPER BACK AREA.;  Surgeon: Theodoro Kos, DO;  Location: [place];  Service: Plastics;  Laterality: N/A;  . Incision and drainage of wound N/A 11/25/2014    Procedure: IRRIGATION AND DEBRIDEMENT OF SACRAL ULCER AND BACK BURN WITH PLACEMENT OF A-CELL;  Surgeon: Theodoro Kos, DO;  Location: [place];  Service: Plastics;  Laterality: N/A;  . Minor application of wound vac N/A 11/25/2014    Procedure:  WOUND VAC CHANGE;  Surgeon: Theodoro Kos, DO;  Location: [place];  Service: Plastics;  Laterality: N/A;  . Incision and drainage of wound Right 12/08/2014    Procedure: IRRIGATION AND DEBRIDEMENT SACRAL WOUND AND RIGHT ISCHIAL WOUND ;  Surgeon: Theodoro Kos, DO;  Location: [place];  Service: Plastics;  Laterality: Right;  . Application of a-cell of back N/A 12/08/2014    Procedure: APPLICATION OF A-CELL AND WOUND VAC ;  Surgeon: Theodoro Kos, DO;  Location: [place];  Service: Plastics;  Laterality: N/A;  . Tee without cardioversion N/A 05/05/2015    Procedure: TRANSESOPHAGEAL ECHOCARDIOGRAM (TEE);  Surgeon: Lelon Perla, MD;  Location: [place];  Service: Cardiovascular;  Laterality: N/A;  . Incision and drainage of wound Bilateral 05/05/2015    Procedure: IRRIGATION AND DEBRIDEMENT SACRAL ULCER;  Surgeon: Theodoro Kos, DO;  Location: [place];  Service: Plastics;  Laterality: Bilateral;  . Hematoma evacuation N/A 05/05/2015    Procedure: EVACUATION HEMATOMA bedside procedure;  Surgeon: Theodoro Kos, DO;  Location:  [place];  Service: Plastics;  Laterality: N/A;  . Incision and drainage of wound N/A 08/04/2015    Procedure: IRRIGATION AND DEBRIDEMENT OF SACRAL ULCER AND ;  Surgeon: Loel Lofty Dillingham, DO;  Location: [place];  Service: Plastics;  Laterality: N/A;  . Application of a-cell of chest/abdomen N/A 08/04/2015    Procedure: APPLICATION OF A-CELL ;  Surgeon: Loel Lofty Dillingham, DO;  Location: [place];  Service: Plastics;  Laterality: N/A;    in for   Chief Complaint  Patient presents with  . 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 He has associated peripheral neuropathy gastroparesis and neurogenic bladder requiring suprapubic catheter. Subsequent to his developed multiple stage IV decubiti of the sacrum and ischium areas with chronic osteomyelitis and is repeatedly hospitalized for recurrent bacteremia and sepsis either secondary wound infections or recurrent UTI. He has a history of DVT and PE but is not anticoagulated noting a history of prior hemorrhagic shock and now has an IVC filter in place. In August 2016 tracheostomy was placed secondary to ARDS. His also had multiple multi drug-resistant infections involving Pseudomonas, Burkholderia and Acinetobacter. He has been evaluated by ID with appropriate treatment during each hospitalization. During the last hospitalization he was found to have multidrug resistant Pseudomonas in his urine as well as sacral osteomyelitis and and was treated initially with vancomycin and imipenem, and subsequently treated with aztreonam and Flagyl. Attempts were made to utilize amikacin for the pseudomonas UTI but the patient became febrile and had leukocytosis so this was discontinued in favor of colistipol. His C. difficile antigen was positive but this was felt to be colonization that this was not treated (toxin was  negative). As eventually discharged home on doxycycline to treat the chronic osteomyelitis in the sacrum. He was discharged on 11/8.  Since discharge patient has had gradual decline which has been worse over the past 5 days. Patient quit eating although he was requesting fluids and according to his mother was drinking diet ginger ale, apple juice and Gatorade. Because of altered mentation and hallucinations the patient's mother was concerned this was coming from the patient's doxycycline so infectious disease service was current was contacted and recommendations were given to stop the doxycycline. Also the patient has remained excessively fatigued and was noted to have blood pressures lower than usual at home despite using his chronic Midodrine his  mother brought him in for evaluation. She also noticed that he appeared to be more confused and has been running low-grade temperatures at night. He apparently has been complaining of diffuse abdominal pain.  In the ER the patient had an oral temperature 100.4, he was hypotensive upon presentation with a blood pressure 79/51, pulse was 90 respirations were 11. He was lethargic. He was requiring oxygen via trach with sats 95%. He was given 2 L of fluid in the ER with some improvement in his blood pressure to 109/76. He was not having any output from his suprapubic catheter so this was changed by the ER physician. Unfortunately despite 2 L of fluid he still was not expressing any urine from the suprapubic catheter. Of note the abdomen is not distended on exam. Laboratory data remarkable for sodium of 127 in setting of glucose 383, potassium 5, BUN 34 with a creatinine of 1.63 with baseline BUN 9 creatinine 0.75. Him is 14.8 with previous calcium 10.4 on 11/8. Lactic acid normal at 1.28 with a CRP of 16 and ESR of 127. White count elevated at 28,900 with absolute neutrophils 23.4% and neutrophils 81%. Platelets are elevated at 797,000. Blood cultures have been obtained.  Chest x-ray revealed right-sided pneumonia with trach in appropriate position.   Review of Systems   In addition to the HPI above,  No Headache, changes with Vision or hearing, new weakness, tingling, numbness in any extremity, No observed problems swallowing food or Liquids, indigestion/reflux her mother No Chest pain, Cough or Shortness of Breath, palpitations, orthopnea or DOE No N/V; no melena or hematochezia, no dark tarry stools No dysuria, hematuria or flank pain No new skin rashes, lesions, masses or bruises, No new joints pains-achesss No polyuria, polydypsia or polyphagia,  *A full 10 point Review of Systems was done, except as stated above, all other Review of Systems were negative.  Social History Social History  Substance Use Topics  . Smoking status: Former Smoker -- 1.00 packs/day for 12 years    Types: Cigars, Cigarettes    Quit date: 09/07/2014  . Smokeless tobacco: Never Used  . Alcohol Use: No    Resides at: Private residence  Lives with: Parents  Ambulatory status: Nonambulatory and bedbound   Family History Family History  Problem Relation Age of Onset  . Diabetes Father   . Hypertension Father   . Asthma      fhx  . Hypertension      fhx  . Stroke      fhx  . Heart disease Mother      Prior to Admission medications   Medication Sig Start Date End Date Taking? Authorizing Provider  acetaminophen (TYLENOL) 325 MG tablet Take 2 tablets (650 mg total) by mouth every 6 (six) hours as needed for mild pain, moderate pain, fever or headache. 01/14/15  Yes Geradine Girt, DO  albuterol (PROVENTIL) (2.5 MG/3ML) 0.083% nebulizer solution Take 3 mLs (2.5 mg total) by nebulization every 4 (four) hours as needed for wheezing or shortness of breath. 04/27/14  Yes [name], MD  baclofen (LIORESAL) 10 MG tablet Take 1-2 tablets (10-20 mg total) by mouth 3 (three) times daily. Takes 42m in morning and lunchtime and takes 269mat bedtime Patient taking  differently: 10-20 mg by PEG Tube route 3 (three) times daily. Take 1 tablet (10 mg) by mouth at 9am and 6pm, take 2 tablets (20 mg) daily at bedtime - 10pm 06/15/15  Yes ZaMeredith StaggersMD  colistimethate  75 mg in sodium chloride 0.9 % 100 mL Inject 75 mg into the vein every 12 (twelve) hours. 08/16/15  Yes Janece Canterbury, MD  collagenase (SANTYL) ointment Apply 1 application topically daily. Apply to back and foot wound every M/W/F Patient taking differently: Apply 1 application topically every Monday, Wednesday, and Friday. Apply topically to foot wound 06/01/15  Yes Erick Colace, NP  fentaNYL (DURAGESIC - DOSED MCG/HR) 50 MCG/HR Place 1 patch (50 mcg total) onto the skin every 3 (three) days. 06/29/15  Yes Meredith Staggers, MD  ferrous sulfate 325 (65 FE) MG tablet Take 1 tablet (325 mg total) by mouth daily with breakfast. 08/17/15  Yes Janece Canterbury, MD  GLUCERNA (GLUCERNA) LIQD 1,000 mLs by PEG Tube route continuous. 70 ml/hr   Yes Historical Provider, MD  insulin aspart (NOVOLOG FLEXPEN) 100 UNIT/ML FlexPen Inject 0-10 Units into the skin 4 (four) times daily as needed for high blood sugar (CBG >200). Per sliding scale: CBG <200 0 units, 201-300 4 units, 301-400 6 units, >400 10 units   Yes Historical Provider, MD  insulin glargine (LANTUS) 100 UNIT/ML injection Inject 0.15 mLs (15 Units total) into the skin at bedtime. 08/16/15  Yes Janece Canterbury, MD  LORazepam (ATIVAN) 0.5 MG tablet Take 0.5 mg by mouth 3 (three) times daily as needed for anxiety.   Yes Historical Provider, MD  Magnesium Oxide 400 MG CAPS Take 1 capsule (400 mg total) by mouth 2 (two) times daily. 08/16/15  Yes Janece Canterbury, MD  midodrine (PROAMATINE) 10 MG tablet Take 2 tablets (20 mg total) by mouth 3 (three) times daily with meals. Patient taking differently: 40 mg by PEG Tube route 3 (three) times daily with meals. 9am, 12pm, 6pm 06/01/15  Yes Erick Colace, NP  Multiple Vitamin (MULTIVITAMIN WITH MINERALS) TABS  tablet Place 1 tablet into feeding tube daily. Patient taking differently: 1 tablet by PEG Tube route daily.  06/01/15  Yes Erick Colace, NP  ondansetron (ZOFRAN ODT) 8 MG disintegrating tablet Place 1 tablet (8 mg total) into feeding tube every 8 (eight) hours as needed for nausea or vomiting. Patient taking differently: 8 mg by PEG Tube route every 8 (eight) hours as needed for nausea or vomiting.  06/01/15  Yes Erick Colace, NP  Oxycodone HCl 20 MG TABS Take 1 tablet (20 mg total) by mouth every 6 (six) hours as needed. With dressing changes and for break through pain but NO MORE THAN 4 PER DAY Patient taking differently: Take 20 mg by mouth every 6 (six) hours as needed (breakthrough pain). With dressing changes and for break through pain but NO MORE THAN 4 PER DAY 06/29/15  Yes Meredith Staggers, MD  pantoprazole sodium (PROTONIX) 40 mg/20 mL PACK Place 20 mLs (40 mg total) into feeding tube daily. Patient taking differently: 40 mg by PEG Tube route daily.  06/01/15  Yes Erick Colace, NP  pregabalin (LYRICA) 100 MG capsule Place 1 capsule (100 mg total) into feeding tube 3 (three) times daily. Take 100 mg by mouth in the morning, take 100 mg by mouth in the afternoon, take 100 mg by mouth in the evening and take 200 mg by mouth at bedtime. 08/19/15  Yes [name], MD  pregabalin (LYRICA) 200 MG capsule Take 200 mg by mouth at bedtime.   Yes Historical Provider, MD  saccharomyces boulardii (FLORASTOR) 250 MG capsule Take 1 capsule (250 mg total) by mouth 2 (two) times daily. 08/16/15  Yes  Janece Canterbury, MD  ACCU-CHEK SMARTVIEW test strip Use to check blood sugar 4 times daily. Patient not taking: Reported on 08/25/2015 08/15/15   Renato Shin, MD  doxycycline (VIBRA-TABS) 100 MG tablet Take 1 tablet (100 mg total) by mouth 2 (two) times daily. Patient not taking: Reported on 08/25/2015 08/19/15   Thayer Headings, MD  Emollient (COLLAGEN EX) Apply 1 application topically daily. Apply daily  to sacral/ischial wound, pack with dry kerlix, cover with abd gauze    Historical Provider, MD  glucagon (GLUCAGON EMERGENCY) 1 MG injection INJECT INTO MUSCLE ONCE AS NEEDED Patient taking differently: Inject 1 mg into the muscle once as needed (low blood sugar).  07/05/15   Renato Shin, MD  insulin aspart (NOVOLOG) 100 UNIT/ML injection Inject 5 Units into the skin 3 (three) times daily with meals. Patient not taking: Reported on 08/25/2015 01/14/15   Geradine Girt, DO  Insulin Syringes, Disposable, U-100 1 ML MISC Dispense 29G x 1/2 inch, use as directed, diagnosis code E 10.41 07/08/15   [name], MD  mirtazapine (REMERON) 7.5 MG tablet Place 1 tablet (7.5 mg total) into feeding tube at bedtime. Patient taking differently: 7.5 mg by PEG Tube route at bedtime as needed (sleep).  06/01/15   Erick Colace, NP    Allergies  Allergen Reactions  . Cefuroxime Axetil Anaphylaxis  . Ertapenem Other (See Comments)    Rash and confusion-->tolerated Imipenem   . Morphine And Related Other (See Comments)    Changed mental status, confusion, headache, visual hallucination  . Penicillins Anaphylaxis and Other (See Comments)    Tolerated Imipenem; no reaction to 7 day course of amoxicillin in 2015 Has patient had a PCN reaction causing immediate rash, facial/tongue/throat swelling, SOB or lightheadedness with hypotension: Yes Has patient had a PCN reaction causing severe rash involving mucus membranes or skin necrosis: No Has patient had a PCN reaction that required hospitalization Yes Has patient had a PCN reaction occurring within the last 10 years: No If all of the above answers are "NO", then may proceed with Cephalo  . Sulfa Antibiotics Anaphylaxis, Shortness Of Breath and Other (See Comments)  . Tessalon [Benzonatate] Anaphylaxis  . Shellfish Allergy Itching and Other (See Comments)    Took benadryl to alleviate reaction  . Nsaids Other (See Comments)    Risk of bleeding  . Miripirium  Rash and Other (See Comments)    Change in mental status    Physical Exam  Vitals  Blood pressure 109/76, pulse 81, temperature 98.9 F (37.2 C), temperature source Rectal, resp. rate 13, weight 125 lb (56.7 kg), SpO2 100 %.   General:  Chronically ill appearing young male who is minimally responsive -appears very underweight and malnourished  Psych:  Lethargic affect, nonverbal and setting of acute metabolic changes as well as chronic tracheostomy tube  Neuro:   No apparent focal neurological deficits, testing limited based on patient inability to participate  ENT:  Ears and Eyes appear Normal, Conjunctivae clear, PER. Extremely dry oral mucosa without erythema or exudates.  Neck:  Supple, No lymphadenopathy appreciated  Respiratory:  Symmetrical chest wall movement, Good air movement bilaterally, worse inspiratory and expiratory rhonchi diffusely. Tracheostomy tube patent, 80% FiO2 with sats 100%  Cardiac:  RRR, No Murmurs, no LE edema noted, no JVD, No carotid bruits, peripheral pulses palpable at 2+  Abdomen:  Positive bowel sounds, Soft, Non tender to palpation, Non distended,  No masses appreciated, no obvious hepatosplenomegaly; left lower quadrant colostomy patent  with moderate volume "mushy" light brown stool  Skin:  No Cyanosis, poor Skin Turgor, No Skin Rash or Bruise. Has multiple decubiti involving the sacrum and initial areas as well as the plantar surface of the left foot and several toes on left foot-resting's most recently changed by EDP so did not remove to evaluate; per mother women's at this time are stable and clean in appearance  Extremities: Symmetrical without obvious trauma or injury,  no effusions. Bilateral offloading boots to lower extremities  Data Review  CBC  Recent Labs Lab 08/25/15 0856  WBC 28.9*  HGB 10.8*  HCT 34.7*  PLT 797*  MCV 86.1  MCH 26.8  MCHC 31.1  RDW 14.2  LYMPHSABS 2.3  MONOABS 2.6*  EOSABS 0.6  BASOSABS 0.0     Chemistries   Recent Labs Lab 08/25/15 0856  NA 127*  K 5.0  CL 96*  CO2 24  GLUCOSE 383*  BUN 34*  CREATININE 1.63*  CALCIUM 14.8*  AST 11*  ALT 8*  ALKPHOS 109  BILITOT 0.3    estimated creatinine clearance is 52.7 mL/min (by C-G formula based on Cr of 1.63).  No results for input(s): TSH, T4TOTAL, T3FREE, THYROIDAB in the last 72 hours.  Invalid input(s): FREET3  Coagulation profile No results for input(s): INR, PROTIME in the last 168 hours.  No results for input(s): DDIMER in the last 72 hours.  Cardiac Enzymes No results for input(s): CKMB, TROPONINI, MYOGLOBIN in the last 168 hours.  Invalid input(s): CK  Invalid input(s): POCBNP  Urinalysis    Component Value Date/Time   COLORURINE YELLOW 07/27/2015 0645   APPEARANCEUR TURBID* 07/27/2015 0645   LABSPEC 1.010 07/27/2015 0645   PHURINE 6.0 07/27/2015 0645   GLUCOSEU NEGATIVE 07/27/2015 0645   HGBUR MODERATE* 07/27/2015 0645   BILIRUBINUR NEGATIVE 07/27/2015 0645   BILIRUBINUR neg 06/28/2014 1445   KETONESUR NEGATIVE 07/27/2015 0645   PROTEINUR NEGATIVE 07/27/2015 0645   PROTEINUR + 06/28/2014 1445   UROBILINOGEN 0.2 07/27/2015 0645   UROBILINOGEN 0.2 06/28/2014 1445   NITRITE POSITIVE* 07/27/2015 0645   NITRITE + 06/28/2014 1445   LEUKOCYTESUR LARGE* 07/27/2015 0645    Imaging results:   Dg Chest Port 1 View  08/25/2015  CLINICAL DATA:  Fever. EXAM: PORTABLE CHEST 1 VIEW COMPARISON:  July 27, 2015. FINDINGS: Tracheostomy is in grossly good position. Cardiomediastinal silhouette appears normal. No pneumothorax or significant pleural effusion is noted. Left lung is clear. Right midlung and basilar opacity is noted concerning for pneumonia. Bony thorax is unremarkable. IMPRESSION: Right-sided pneumonia is noted.  Tracheostomy is in good position. Electronically Signed   By: Marijo Conception, M.D.   On: 08/25/2015 09:36   Dg Chest Port 1 View  07/27/2015  CLINICAL DATA:  Tracheostomy and  fever EXAM: PORTABLE CHEST 1 VIEW COMPARISON:  07/16/2015 FINDINGS: Tracheostomy tube in unchanged position over the tracheal air column. Heart size and vascular pattern are normal. There are mild bilateral lower lobe infiltrates. When compared to the prior study there both significantly improved. No pleural effusion. Stable mild expansile deformity distal right clavicle possibly due to prior trauma. IMPRESSION: Persistent but improved bilateral lower lobe infiltrates. Electronically Signed   By: Skipper Cliche M.D.   On: 07/27/2015 08:09      Assessment & Plan  Principal Problem:   SIRS (systemic inflammatory response syndrome) ([name]) -Step down -Source appears to be pulmonary in nature (HCAP) and exacerbated by severe dehydration -Unable to obtain urine secondary to lack of urinary output  from suprapubic catheter but cannot exclude this as source of infection as well -Does not appear septic as lactic acid is normal and blood pressure/MAP has responded to IV fluid hydration -Continue broad-spectrum empiric antibiotics -Continue to cycle lactic acid -IV consultation based on history of recurrent multi drug resistant infections from multiple sources -Patient reporting abdominal pain so will repeat C. 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[place]) -Chest x-ray reveals right-sided infiltrates -Likely healthcare acquired etiology but given altered mentation and known issues with dysphagia and chronic trach likely has a degree of chronic aspiration -Vancomycin and Primaxin for now -Await recommendations from ID physician -Continue supportive care -Sputum culture; also check adenovirus antibodies, HIV antibody, Legionella and urinary strep well as influenza  -Nebulizers as needed   Severe dehydration/acute kidney  injury -We'll give an additional 1 L IV fluids and continue IV fluids at 150 mL per hour -Baseline BUN 9 and current BUN 34 with a calcium of 14.8 all suggestive of buying depletion -Labs in a.m.     Uncontrolled insulin-dependent diabetes -Has brittle type diabetes and alternates between hyperglycemia hypoglycemia -On Lantus 15 units at home-start with 10 units for now and utilize resistant sliding scale insulin and adjust as indicated -Per mother patient also utilizes 2 different types of tube feedings: Most likely Serna but when CBGs decrease uses Vical 2.0    Dysphagia, pharyngoesophageal phase/Gastroparesis due to DM ([place]) -According to the patient's mother he does not have any problems swallowing although has previously documented history of dysphagia and given chronic trach and debility I suspect he still has a degree of chronic dysphagia -Once patient mentation at baseline recommend SLP evaluation -NPO were now -Continue Glucerna tube feedings at 66 mL/h from 11 AM to 11 PM    Neurogenic bladder/Suprapubic catheter ([place]) -Suprapubic catheter changed out by ER physician -Await urine culture when shown output increases    Severe protein-calorie malnutrition ([place]) -Continue tube feedings as above -Nutrition consultation    Chronic anemia -Stable and near baseline    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 ([place]) -Continue offloading boots and specialty bed -WOC RN consultation    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[facility]) -Lives at home with family -Continue supportive care -Has recurrent admissions and physiologically continues to decline; mother appears to lack insight into poor prognosis of her son and although I feel goals of care discussion would be beneficial I'm not sure the mother is ready to approach the subject at this time but continue to monitor daily and reevaluate for family readiness    Hypomagnesemia -Continue per tube repletion    Decubitus ulcer of  right perineal ischial region, stage 4/Decubitus ulcer of left perineal ischial region, stage 4/Decubitus ulcer of sacral region, stage 4 ([place]) -As above regarding specialty bed and wound care consultation    DVT Prophylaxis: Lovenox   Family Communication:   Mother and father at bedside   Code Status:  Full code   Condition:  Guarded   Discharge disposition: Anticipate will return to home environment with home health and parents providing primary care once medically stable   Time spent in minutes : 60      Meili Kleckley L. ANP on 08/25/2015 at 11:33 AM  Between 7am to 7pm - Pager - [phone]  After 7pm go to [email or web address] - password [email or web address]  And look for the night coverage  person covering me after hours  [facility]

## 2015-08-25 NOTE — ED Notes (Signed)
Attempted to call report

## 2015-08-25 NOTE — ED Notes (Signed)
Per [facility] patient stopped taking antibiotics 2 days ago for a known infection after experiencing hallucinations.  Since then patient's temperature has increased and patient feels more weak.  Patient in no apparent distress at this time and is alert and oriented.

## 2015-08-25 NOTE — ED Provider Notes (Signed)
CSN: [id number]     Arrival date & time 08/25/15  [id number] History   First MD Initiated Contact with Patient 08/25/15 0831     Chief Complaint  Patient presents with  . Code Sepsis     (Consider location/radiation/quality/duration/timing/severity/associated sxs/prior Treatment) HPI   [id number] with complicated past medical history.  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 He has had numerous hospitalizations over the last few years and has become progressively sicker. He developed pulmonary embolism requiring IVC filter placement. In August, he underwent tracheostomy placement secondary to ARDS.  Recent history significant for admission 10/2-11/8 with sepsis due to MDR Pseudomonas Aeruginosa catheter-associated UTI present at time of admission (indwelling suprapubic catheter) and decubitus osteomyelitis. ID assisted with management throughout. Initially he was started on broad spectrum antibiotics to cover UTI and osteomyelitis. His antibiotics were narrowed to cover UTI only as his osteomyelitis was felt to be chronic and bone biopsy grew mixed flora. Discharged home to complete 3 more days of colistin & ID follow-up. Started on Doxycycline 11/11 for long term tx of osteomyelitis.   On 11/15 noticed that pt very tired. Hypotensive. Hx of same on midodrine, but BP has been running lower than normal. Mother felt may be medication side effect and doxycycline stopped. Continued symptoms and fever last night so brought in for evaluation today. Pt is currently very drowsy. Opens eyes to voice and answers some questions but unable to tell much beyond that he feels "sick" and "everything hurts."   Chronic wounds which mother and  caretakers do not think have had significant changes recenty. Pt hasn't voiced much in terms of complaints aside from everything hurting. No cough. No increased secretions. Mother thinks suprapubic catheter last changed sometime during last admission.  Past Medical History  Diagnosis Date  . GERD (gastroesophageal reflux disease)   . Asthma   . Hx MRSA infection     on face  . Gastroparesis   . Diabetic neuropathy ([place])   . Seizures ([place])   . Family history of anesthesia complication     Pt mother can't have epidural procedures  . Dysrhythmia   . Pneumonia   . Arthritis   . Fibromyalgia   . 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 Type I diabetes mellitus ([place])     sees Dr. Loanne Drilling   . Syncope 02/16/2015   Past Surgical History  Procedure Laterality Date  . Tonsillectomy    . Multiple extractions with alveoloplasty N/A 08/03/2014    Procedure: MULTIPLE EXTRACTIONS;  Surgeon: Gae Bon, DDS;  Location: [place];  Service: Oral Surgery;  Laterality: N/A;  . Tee without cardioversion N/A 08/17/2014    Procedure: TRANSESOPHAGEAL ECHOCARDIOGRAM (TEE);  Surgeon: Dorothy Spark, MD;  Location: [facility] ENDOSCOPY;  Service: Cardiovascular;  Laterality: N/A;  . Debridment of decubitus ulcer N/A 10/04/2014    Procedure: DEBRIDMENT OF DECUBITUS ULCER;  Surgeon: Georganna Skeans, MD;  Location: [place];  Service: General;  Laterality: N/A;  . Laparoscopic diverted colostomy N/A 10/12/2014    Procedure: LAPAROSCOPIC DIVERTING COLOSTOMY;  Surgeon: Donnie Mesa, MD;  Location: [place];  Service: General;  Laterality: N/A;  . Insertion of suprapubic catheter N/A 10/12/2014    Procedure: INSERTION OF SUPRAPUBIC CATHETER;  Surgeon: Elayne Snare  MacDiarmid, MD;  Location: [place];  Service: Urology;  Laterality: N/A;  . Incision and drainage of wound N/A 11/12/2014    Procedure: IRRIGATION AND DEBRIDEMENT OF WOUNDS WITH BONE BIOPSY AND SURGICAL PREP ;  Surgeon: Theodoro Kos, DO;  Location: [place];  Service: Plastics;  Laterality: N/A;  . Application of a-cell of back N/A 11/12/2014    Procedure: APPLICATION A CELL AND VAC ;  Surgeon: Theodoro Kos, DO;  Location: [place];  Service: Plastics;  Laterality: N/A;  . Incision and drainage of wound N/A 11/18/2014    Procedure: IRRIGATION AND DEBRIDEMENT OF SACRAL ULCER ONLY WITH PLACEMENT OF A CELL AND VAC/ DRESSING CHANGE TO UPPER BACK AREA.;  Surgeon: Theodoro Kos, DO;  Location: [place];  Service: Plastics;  Laterality: N/A;  . Incision and drainage of wound N/A 11/25/2014    Procedure: IRRIGATION AND DEBRIDEMENT OF SACRAL ULCER AND BACK BURN WITH PLACEMENT OF A-CELL;  Surgeon: Theodoro Kos, DO;  Location: [place];  Service: Plastics;  Laterality: N/A;  . Minor application of wound vac N/A 11/25/2014    Procedure:  WOUND VAC CHANGE;  Surgeon: Theodoro Kos, DO;  Location: [place];  Service: Plastics;  Laterality: N/A;  . Incision and drainage of wound Right 12/08/2014    Procedure: IRRIGATION AND DEBRIDEMENT SACRAL WOUND AND RIGHT ISCHIAL WOUND ;  Surgeon: Theodoro Kos, DO;  Location: [place];  Service: Plastics;  Laterality: Right;  . Application of a-cell of back N/A 12/08/2014    Procedure: APPLICATION OF A-CELL AND WOUND VAC ;  Surgeon: Theodoro Kos, DO;  Location: [place];  Service: Plastics;  Laterality: N/A;  . Tee without cardioversion N/A 05/05/2015    Procedure: TRANSESOPHAGEAL ECHOCARDIOGRAM (TEE);  Surgeon: Lelon Perla, MD;  Location: [place];  Service: Cardiovascular;  Laterality: N/A;  . Incision and drainage of wound Bilateral 05/05/2015    Procedure: IRRIGATION AND DEBRIDEMENT SACRAL ULCER;  Surgeon: Theodoro Kos, DO;  Location: [place];  Service: Plastics;  Laterality: Bilateral;  . Hematoma evacuation N/A 05/05/2015    Procedure: EVACUATION HEMATOMA bedside procedure;  Surgeon: Theodoro Kos, DO;  Location: [place];  Service: Plastics;  Laterality: N/A;  . Incision and drainage of wound N/A 08/04/2015    Procedure: IRRIGATION AND  DEBRIDEMENT OF SACRAL ULCER AND ;  Surgeon: Loel Lofty Dillingham, DO;  Location: [place];  Service: Plastics;  Laterality: N/A;  . Application of a-cell of chest/abdomen N/A 08/04/2015    Procedure: APPLICATION OF A-CELL ;  Surgeon: Loel Lofty Dillingham, DO;  Location: [place];  Service: Plastics;  Laterality: N/A;   Family History  Problem Relation Age of Onset  . Diabetes Father   . Hypertension Father   . Asthma      fhx  . Hypertension      fhx  . Stroke      fhx  . Heart disease Mother    Social History  Substance Use Topics  . Smoking status: Former Smoker -- 1.00 packs/day for 12 years    Types: Cigars, Cigarettes    Quit date: 09/07/2014  . Smokeless tobacco: Never Used  . Alcohol Use: No    Review of Systems    Allergies  Cefuroxime axetil; Ertapenem; Morphine and related; Penicillins; Sulfa antibiotics; Tessalon; Shellfish allergy; Nsaids; and Miripirium  Home Medications   Prior to Admission medications   Medication Sig Start Date End Date Taking? Authorizing Provider  ACCU-CHEK SMARTVIEW test strip Use to check blood sugar 4 times daily. 08/15/15  Renato Shin, MD  acetaminophen (TYLENOL) 325 MG tablet Take 2 tablets (650 mg total) by mouth every 6 (six) hours as needed for mild pain, moderate pain, fever or headache. 01/14/15   Geradine Girt, DO  albuterol (PROVENTIL) (2.5 MG/3ML) 0.083% nebulizer solution Take 3 mLs (2.5 mg total) by nebulization every 4 (four) hours as needed for wheezing or shortness of breath. 04/27/14   [name], MD  baclofen (LIORESAL) 10 MG tablet Take 1-2 tablets (10-20 mg total) by mouth 3 (three) times daily. Takes 10mg  in morning and lunchtime and takes 20mg  at bedtime Patient taking differently: 10-20 mg by PEG Tube route 3 (three) times daily. Take 1 tablet (10 mg) by mouth at 9am and 6pm, take 2 tablets (20 mg) daily at bedtime - 10pm 06/15/15   Meredith Staggers, MD  colistimethate 75 mg in sodium chloride 0.9 % 100 mL Inject 75 mg into  the vein every 12 (twelve) hours. 08/16/15   Janece Canterbury, MD  collagenase (SANTYL) ointment Apply 1 application topically daily. Apply to back and foot wound every M/W/F Patient taking differently: Apply 1 application topically every Monday, Wednesday, and Friday. Apply topically to foot wound 06/01/15   Erick Colace, NP  doxycycline (VIBRA-TABS) 100 MG tablet Take 1 tablet (100 mg total) by mouth 2 (two) times daily. 08/19/15   Thayer Headings, MD  Emollient (COLLAGEN EX) Apply 1 application topically daily. Apply daily to sacral/ischial wound, pack with dry kerlix, cover with abd gauze    Historical Provider, MD  fentaNYL (DURAGESIC - DOSED MCG/HR) 50 MCG/HR Place 1 patch (50 mcg total) onto the skin every 3 (three) days. 06/29/15   Meredith Staggers, MD  ferrous sulfate 325 (65 FE) MG tablet Take 1 tablet (325 mg total) by mouth daily with breakfast. 08/17/15   Janece Canterbury, MD  glucagon (GLUCAGON EMERGENCY) 1 MG injection INJECT INTO MUSCLE ONCE AS NEEDED Patient taking differently: Inject 1 mg into the muscle once as needed (low blood sugar).  07/05/15   Renato Shin, MD  GLUCERNA [facility]) LIQD by PEG Tube route continuous. 70 ml/hr    Historical Provider, MD  insulin aspart (NOVOLOG FLEXPEN) 100 UNIT/ML FlexPen Inject 0-10 Units into the skin 4 (four) times daily as needed for high blood sugar (CBG >200). Per sliding scale: CBG <200 0 units, 201-300 4 units, 301-400 6 units, >400 10 units    Historical Provider, MD  insulin aspart (NOVOLOG) 100 UNIT/ML injection Inject 5 Units into the skin 3 (three) times daily with meals. Patient not taking: Reported on 07/10/2015 01/14/15   Geradine Girt, DO  insulin glargine (LANTUS) 100 UNIT/ML injection Inject 0.15 mLs (15 Units total) into the skin at bedtime. 08/16/15   Janece Canterbury, MD  Insulin Syringes, Disposable, U-100 1 ML MISC Dispense 29G x 1/2 inch, use as directed, diagnosis code E 10.41 07/08/15   [name], MD  LORazepam (ATIVAN) 0.5  MG tablet Take 0.5 mg by mouth 3 (three) times daily as needed for anxiety.    Historical Provider, MD  Magnesium Oxide 400 MG CAPS Take 1 capsule (400 mg total) by mouth 2 (two) times daily. 08/16/15   Janece Canterbury, MD  midodrine (PROAMATINE) 10 MG tablet Take 2 tablets (20 mg total) by mouth 3 (three) times daily with meals. Patient taking differently: 40 mg by PEG Tube route 3 (three) times daily with meals. 9am, 12pm, 6pm 06/01/15   Erick Colace, NP  mirtazapine (REMERON) 7.5  MG tablet Place 1 tablet (7.5 mg total) into feeding tube at bedtime. Patient taking differently: 7.5 mg by PEG Tube route at bedtime as needed (sleep).  06/01/15   Erick Colace, NP  Multiple Vitamin (MULTIVITAMIN WITH MINERALS) TABS tablet Place 1 tablet into feeding tube daily. Patient taking differently: 1 tablet by PEG Tube route daily.  06/01/15   Erick Colace, NP  ondansetron (ZOFRAN ODT) 8 MG disintegrating tablet Place 1 tablet (8 mg total) into feeding tube every 8 (eight) hours as needed for nausea or vomiting. Patient taking differently: 8 mg by PEG Tube route every 8 (eight) hours as needed for nausea or vomiting.  06/01/15   Erick Colace, NP  Oxycodone HCl 20 MG TABS Take 1 tablet (20 mg total) by mouth every 6 (six) hours as needed. With dressing changes and for break through pain but NO MORE THAN 4 PER DAY Patient taking differently: Take 20 mg by mouth every 6 (six) hours as needed (breakthrough pain). With dressing changes and for break through pain but NO MORE THAN 4 PER DAY 06/29/15   Meredith Staggers, MD  pantoprazole sodium (PROTONIX) 40 mg/20 mL PACK Place 20 mLs (40 mg total) into feeding tube daily. Patient taking differently: 40 mg by PEG Tube route daily.  06/01/15   Erick Colace, NP  pregabalin (LYRICA) 100 MG capsule Place 1 capsule (100 mg total) into feeding tube 3 (three) times daily. Take 100 mg by mouth in the morning, take 100 mg by mouth in the afternoon, take 100 mg by mouth in  the evening and take 200 mg by mouth at bedtime. 08/19/15   [name], MD  saccharomyces boulardii (FLORASTOR) 250 MG capsule Take 1 capsule (250 mg total) by mouth 2 (two) times daily. 08/16/15   Janece Canterbury, MD   BP 82/48 mmHg  Pulse 94  Temp(Src) 100.4 F (38 C) (Oral)  Resp 23  Wt 125 lb (56.7 kg)  SpO2 89% Physical Exam  Constitutional:  Laying in bed with eyes closed. Frail/chronically ill appearing. Cachectic.   HENT:  Head: Atraumatic.  Eyes: Conjunctivae are normal. Pupils are equal, round, and reactive to light.  Abdominal: Soft. He exhibits no distension. There is no tenderness.  Ostomy with liquid brown stool. No distension. Nontender.   Genitourinary:  Suprapubic cathteer. cloudy urine in bag.   Neurological:  Drowsy. Opens eyes to voice. Answers simple questions appropriately.   Skin: Skin is warm and dry.  Bilateral ischial wounds, ~4x6 cm. Sacral wound ~6x8cm. Ischial wounds with healthy appearing tissue which appears to be granulating in. Sacral wound with some foul smelling purulence. Bone visible. ~5x5cm wound to L thoracic region extending into dermis. No surrounding cellulitis. Ulcerations to L heel and L lateral foot. No purulence/drainage. No cellulitis.   Nursing note and vitals reviewed.   ED Course  BLADDER CATHETERIZATION Date/Time: 08/25/2015 9:30 AM Performed by: Virgel Manifold Authorized by: Virgel Manifold Consent: Verbal consent obtained. Consent given by: parent Patient identity confirmed: provided demographic data, arm band and verbally with patient Indications: catheter change Local anesthesia used: no Patient sedated: no Preparation: Patient was prepped and draped in the usual sterile fashion. Catheter insertion: indwelling Catheter size: 20 Fr Complicated insertion: no Altered anatomy: no Bladder irrigation: no Number of attempts: 1 Urine characteristics: cloudy Patient tolerance: Patient tolerated the procedure well with no  immediate complications Comments: [id number] catheter exchanged without complication.     CRITICAL CARE Performed by: Virgel Manifold Total  critical care time: 35 minutes Critical care time was exclusive of separately billable procedures and treating other patients. Critical care was necessary to treat or prevent imminent or life-threatening deterioration. Critical care was time spent personally by me on the following activities: development of treatment plan with patient and/or surrogate as well as nursing, discussions with consultants, evaluation of patient's response to treatment, examination of patient, obtaining history from patient or surrogate, ordering and performing treatments and interventions, ordering and review of laboratory studies, ordering and review of radiographic studies, pulse oximetry and re-evaluation of patient's condition.  (including critical care time)  Labs Review Labs Reviewed  COMPREHENSIVE METABOLIC PANEL - Abnormal; Notable for the following:    Sodium 127 (*)    Chloride 96 (*)    Glucose, Bld 383 (*)    BUN 34 (*)    Creatinine, Ser 1.63 (*)    Calcium 14.8 (*)    Total Protein 8.8 (*)    Albumin 2.3 (*)    AST 11 (*)    ALT 8 (*)    GFR calc non Af Amer 55 (*)    All other components within normal limits  CBC WITH DIFFERENTIAL/PLATELET - Abnormal; Notable for the following:    WBC 28.9 (*)    RBC 4.03 (*)    Hemoglobin 10.8 (*)    HCT 34.7 (*)    Platelets 797 (*)    Neutro Abs 23.4 (*)    Monocytes Absolute 2.6 (*)    All other components within normal limits  C-REACTIVE PROTEIN - Abnormal; Notable for the following:    CRP 16.0 (*)    All other components within normal limits  SEDIMENTATION RATE - Abnormal; Notable for the following:    Sed Rate 124 (*)    All other components within normal limits  CULTURE, BLOOD (ROUTINE X 2)  CULTURE, BLOOD (ROUTINE X 2)  URINE CULTURE  URINE CULTURE  C DIFFICILE QUICK SCREEN W PCR REFLEX  URINALYSIS,  ROUTINE W REFLEX MICROSCOPIC (NOT AT [facility])  URINALYSIS, ROUTINE W REFLEX MICROSCOPIC (NOT AT [facility])  CORTISOL  I-STAT CG4 LACTIC ACID, ED    Imaging Review Dg Chest Port 1 View  08/25/2015  CLINICAL DATA:  Fever. EXAM: PORTABLE CHEST 1 VIEW COMPARISON:  July 27, 2015. FINDINGS: Tracheostomy is in grossly good position. Cardiomediastinal silhouette appears normal. No pneumothorax or significant pleural effusion is noted. Left lung is clear. Right midlung and basilar opacity is noted concerning for pneumonia. Bony thorax is unremarkable. IMPRESSION: Right-sided pneumonia is noted.  Tracheostomy is in good position. Electronically Signed   By: Marijo Conception, M.D.   On: 08/25/2015 09:36   I have personally reviewed and evaluated these images and lab results as part of my medical decision-making.   EKG Interpretation None      MDM   Final diagnoses:  Severe sepsis ([place])  HCAP (healthcare-associated pneumonia)  AKI (acute kidney injury) ([place])  Dehydration  Hypercalcemia    29yM with sepsis. Mother concerned for possible medication reaction, but I suspect more likely infectious/metabolic cause. Complicated PMHx. Multiple possible sources.  Chronic suprapubic cathter. Wound over sacrum with some purulence. Ischial wounds seem fine. Thoracic wounds superficial with no surrounding cellulitis. Wounds to L foot/heel do no appear to be infected. Hypoxic in 70s on RA. Marland Kitchen Consider pneumonia. Mother reports no recent cough or increased secretions though. High risk for PE. Is s/p filter and apparently not anticoagulant candidate.   Code Sepsis initiated. Hypotension below his baseline. IVF  and reassess. O2 sats improving with supplemental o2. S/p trach. Suprapubic catheter changed. Back/sacral wounds redressed. Multiple abx allergies. Has tolerated nvc/imipinem previously. Per pharmacy.   CXR with R sided infiltrate. Abx ordered. AKI/Hyponatremia/Hypercalcemia. IVF. Hypotension responding.        Virgel Manifold, MD 08/25/15 [phone]

## 2015-08-25 NOTE — Progress Notes (Addendum)
ANTIBIOTIC CONSULT NOTE - INITIAL  Pharmacy Consult for vancomycin/imipenem Indication: sepsis  Allergies  Allergen Reactions  . Cefuroxime Axetil Anaphylaxis  . Ertapenem Other (See Comments)    Rash and confusion-->tolerated Imipenem   . Morphine And Related Other (See Comments)    Changed mental status, confusion, headache, visual hallucination  . Penicillins Anaphylaxis and Other (See Comments)    Tolerated Imipenem; no reaction to 7 day course of amoxicillin in 2015 Has patient had a PCN reaction causing immediate rash, facial/tongue/throat swelling, SOB or lightheadedness with hypotension: Yes Has patient had a PCN reaction causing severe rash involving mucus membranes or skin necrosis: No Has patient had a PCN reaction that required hospitalization Yes Has patient had a PCN reaction occurring within the last 10 years: No If all of the above answers are "NO", then may proceed with Cephalo  . Sulfa Antibiotics Anaphylaxis, Shortness Of Breath and Other (See Comments)  . Tessalon [Benzonatate] Anaphylaxis  . Shellfish Allergy Itching and Other (See Comments)    Took benadryl to alleviate reaction  . Nsaids Other (See Comments)    Risk of bleeding  . Miripirium Rash and Other (See Comments)    Change in mental status    Patient Measurements: Weight: 125 lb (56.7 kg)  Vital Signs: Temp: 100.4 F (38 C) (11/17 0825) Temp Source: Oral (11/17 0825) BP: 82/48 mmHg (11/17 0825) Pulse Rate: 94 (11/17 0825) Intake/Output from previous day:   Intake/Output from this shift:    Labs: No results for input(s): WBC, HGB, PLT, LABCREA, CREATININE in the last 72 hours. Estimated Creatinine Clearance: 107.3 mL/min (by C-G formula based on Cr of 0.75). No results for input(s): VANCOTROUGH, VANCOPEAK, VANCORANDOM, GENTTROUGH, GENTPEAK, GENTRANDOM, TOBRATROUGH, TOBRAPEAK, TOBRARND, AMIKACINPEAK, AMIKACINTROU, AMIKACIN in the last 72 hours.   Microbiology: Recent Results (from the past  720 hour(s))  Culture, blood (routine x 2)     Status: None   Collection Time: 07/27/15  7:05 AM  Result Value Ref Range Status   Specimen Description BLOOD RIGHT HAND  Final   Special Requests BOTTLES DRAWN AEROBIC ONLY 3CC  Final   Culture NO GROWTH 5 DAYS  Final   Report Status 08/01/2015 FINAL  Final  Culture, blood (routine x 2)     Status: None   Collection Time: 07/27/15  7:10 AM  Result Value Ref Range Status   Specimen Description BLOOD RIGHT ARM  Final   Special Requests BOTTLES DRAWN AEROBIC ONLY 3CC  Final   Culture NO GROWTH 5 DAYS  Final   Report Status 08/01/2015 FINAL  Final  Culture, Urine     Status: None   Collection Time: 07/29/15  6:06 PM  Result Value Ref Range Status   Specimen Description URINE, SUPRAPUBIC  Final   Special Requests Normal  Final   Culture   Final    >=100,000 COLONIES/mL PSEUDOMONAS AERUGINOSA AMIKACIN MIC=8 SENSITIVE FOSFOMYCIN MIC=>1024 RESISTANT AVYCAZ=22 SENSITIVE ZERBAXA=1 SENSITIVE    Report Status 08/17/2015 FINAL  Final   Organism ID, Bacteria PSEUDOMONAS AERUGINOSA  Final      Susceptibility   Pseudomonas aeruginosa - MIC*    CEFTAZIDIME 16 INTERMEDIATE Intermediate     CIPROFLOXACIN >=4 RESISTANT Resistant     GENTAMICIN >=16 RESISTANT Resistant     IMIPENEM >=16 RESISTANT Resistant     CEFEPIME 16 INTERMEDIATE Intermediate     * >=100,000 COLONIES/mL PSEUDOMONAS AERUGINOSA  Anaerobic culture     Status: None   Collection Time: 08/04/15  7:08 PM  Result Value  Ref Range Status   Specimen Description TISSUE LEFT SACRAL  Final   Special Requests NONE  Final   Gram Stain   Final    NO WBC SEEN NO ORGANISMS SEEN Performed at [facility]    Culture   Final    NO ANAEROBES ISOLATED Performed at [facility]    Report Status 08/09/2015 FINAL  Final  Fungus Culture with Smear     Status: None (Preliminary result)   Collection Time: 08/04/15  7:11 PM  Result Value Ref Range Status   Specimen Description  TISSUE LEFT SACRAL  Final   Special Requests NONE  Final   Fungal Smear   Final    NO YEAST OR FUNGAL ELEMENTS SEEN Performed at [facility]    Culture   Final    CANDIDA ALBICANS Performed at [facility]    Report Status PENDING  Incomplete  AFB culture with smear     Status: None (Preliminary result)   Collection Time: 08/04/15  7:13 PM  Result Value Ref Range Status   Specimen Description TISSUE LEFT SACRAL  Final   Special Requests NONE  Final   Acid Fast Smear   Final    NO ACID FAST BACILLI SEEN Performed at [facility]    Culture   Final    CULTURE WILL BE EXAMINED FOR 6 WEEKS BEFORE ISSUING A FINAL REPORT Performed at [facility]    Report Status PENDING  Incomplete  Tissue culture     Status: None   Collection Time: 08/04/15  7:18 PM  Result Value Ref Range Status   Specimen Description TISSUE LEFT SACRAL  Final   Special Requests NONE  Final   Gram Stain   Final    NO WBC SEEN NO ORGANISMS SEEN Performed at [facility]    Culture   Final    MODERATE ESCHERICHIA COLI Performed at [facility]    Report Status 08/15/2015 FINAL  Final   Organism ID, Bacteria ESCHERICHIA COLI  Final      Susceptibility   Escherichia coli - MIC*    AMPICILLIN >=32 RESISTANT Resistant     AMPICILLIN/SULBACTAM >=32 RESISTANT Resistant     CEFAZOLIN 8 RESISTANT Resistant     CEFEPIME <=1 SENSITIVE Sensitive     CEFTAZIDIME <=1 SENSITIVE Sensitive     CEFTRIAXONE <=1 SENSITIVE Sensitive     CIPROFLOXACIN <=0.25 SENSITIVE Sensitive     GENTAMICIN <=1 SENSITIVE Sensitive     IMIPENEM <=0.25 SENSITIVE Sensitive     PIP/TAZO <=4 SENSITIVE Sensitive     TOBRAMYCIN <=1 SENSITIVE Sensitive     TRIMETH/SULFA Value in next row Sensitive      <=20 SENSITIVE(NOTE)    * MODERATE ESCHERICHIA COLI  Culture, Urine     Status: None   Collection Time: 08/10/15 10:59 AM  Result Value Ref Range Status   Specimen Description URINE,  SUPRAPUBIC  Final   Special Requests NONE  Final   Culture MULTIPLE SPECIES PRESENT, SUGGEST RECOLLECTION  Final   Report Status 08/12/2015 FINAL  Final  Culture, blood (single)     Status: None   Collection Time: 08/13/15 11:45 AM  Result Value Ref Range Status   Specimen Description BLOOD LEFT HAND  Final   Special Requests BOTTLES DRAWN AEROBIC ONLY 5CC  Final   Culture NO GROWTH 5 DAYS  Final   Report Status 08/18/2015 FINAL  Final  C difficile quick scan w PCR reflex  Status: Abnormal   Collection Time: 08/13/15  9:54 PM  Result Value Ref Range Status   C Diff antigen POSITIVE (A) NEGATIVE Final   C Diff toxin NEGATIVE NEGATIVE Final   C Diff interpretation   Final    C. difficile present, but toxin not detected. This indicates colonization. In most cases, this does not require treatment. If patient has signs and symptoms consistent with colitis, consider treatment. Requires ENTERIC precautions.    Medical History: Past Medical History  Diagnosis Date  . GERD (gastroesophageal reflux disease)   . Asthma   . Hx MRSA infection     on face  . Gastroparesis   . Diabetic neuropathy ([place])   . Seizures ([place])   . Family history of anesthesia complication     Pt mother can't have epidural procedures  . Dysrhythmia   . Pneumonia   . Arthritis   . Fibromyalgia   . 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 Type I diabetes mellitus ([place])     sees Dr. Loanne Drilling   . Syncope 02/16/2015    Assessment: 54 YOM quadriplegic with complicated PMH here with sepsis- Large decubitus ulcers, recent MRSA and micro-aerophilic streptococcal bacteremia, respiratory failure/tracheostomy complicated by MDRO UTI - ended up receiving colistin during admit last month, finished a couple days ago per MD note. Pharmacy consulted to dose vanc/imipenem for sepsis. Tmax 100.4, LA 1.28. No other labs yet.  Re-started vanc based on dose from last hospital admit - difficult to estimate  clearance due to quadriplegia. Also lower imipenem dose on purpose based on pt's last admit. Patient has many documented allergies but has tolerated imipenem in past per chart.  11/17 imipenem>> 11/17 vanc>>  11/17 BCx2>> 11/17 UC>>  Code sepsis called at [place] ordered 1st - RN gave within the 1h time window. Imipenem added. RN to start, also.  Goal of Therapy:  Vancomycin trough level 15-20 mcg/ml  Plan:  Vanc 1g load; then Vanc 500mg  IV q24h Imipenem 250mg  IV q8h Abx doses based on admit last month, quadriplegic Monitor clinical progress, c/s, renal function, abx plan/LOT Vanc level soon due to previous history F/u ID recs  Elicia Lamp, PharmD Clinical Pharmacist Pager [phone] 08/25/2015 9:37 AM

## 2015-08-25 NOTE — ED Notes (Signed)
CODE SEPSIS ACTIVATED 

## 2015-08-25 NOTE — Progress Notes (Signed)
Pt with chronic trach having low spo2 on room air. Rn had placed NRB near trach with improved spo2 to 92%. Pt placed on trach collar with humidified O2 at 10L/80% with spo2 improving to 94%. RT will continue to monitor and wean as tolerated.

## 2015-08-25 NOTE — Consult Note (Signed)
WOC wound consult note Reason for Consult: multiple pressure injuries, full thickness wounds on his back related to allergic reaction.  Followed by plastic surgery for his ischial and sacral wounds.  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 Stage 3 pressure injuries left heel x 4 Full thickness wound mid back Pressure Ulcer POA: Yes x 6 Measurement: L foot/dorsal: 1.0cm x 0.4cm x 0.1cm  L foot lateral: 1.5cm x 1.5cm x 0.1cm  Left heel two open areas however these were one wound previously, skin bridge now intact between 3.5cm x 1.5cm x 0.1cm and 1.0cm x 1.0 x 0.1cm  Left ischium: 8cm x 7cm x 1.5cm with 2.5cm undermining from 4-12 o'clock Right ischium: 5.5cm x 7.0cm x 2cm  Sacrum:9.0cm x 11cm x 3.0cm Midback: 10cm x 10cm with some re epithelization noted throughout the wound   Wound bed: All of the wounds are clean, 100% granulation tissue except the sacrum which has a small amount of necrotic tissue less than 10%.   There is palpable bone in the bilateral ischial wounds and the sacral wound.  They are very clean, do not suspect these as a source of infection Drainage (amount, consistency, odor) moderate to heavy from the sacral wound and ischial wounds.  Most likely due to chronicity.  Periwound:intact at all sites.  Dressing procedure/placement/frequency: Left foot wounds are very, very superficial; continue Prevalon boots for offloading. Silicone foam to protect and insulate. Sacrum and ischial wounds are mostly clean, will use enzymatic debridement ointment in the sacrum over the weekend, may be appropriate to place NPWT VAC back on this wound, however in the past he has had severe bleeding issues.  Will need to monitor closely if we use VAC again.  His mother is asking about VAC use. Low air loss mattress ordered for pressure redistribution. Back wounds are slow to heal, very, very superficial at this point. Will use xeroform as nonadherent and for  antibacterial effects.  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 Will order supplies for staff use.  Patient is using air fluidized bed at home, however with this bed his HOB can not be raised without the use of wedges.  His mother is concerned about this and that his bladder is not draining due to the positioning in this bed.  I am concerned about the ability to really provide pulmonary toilet for this patient if she can not raise his head and allow him to sit up.  Also if his Epimenio Sarin is not draining his bladder adequately then additional risk for infection/UTI.   WOC will reassess wounds on Monday for NPWT, my concerns is that private duty staff will not be able to manage a complex VAC change at home.  This will be a complex VAC, will need to bridge 3 wounds and maintain a seal.  Mother feels that she can manage NPWT VAC at home independently, she reports she has done this in the past.   [place], [place]

## 2015-08-26 ENCOUNTER — Encounter (HOSPITAL_COMMUNITY): Payer: Self-pay | Admitting: Family Medicine

## 2015-08-26 ENCOUNTER — Inpatient Hospital Stay (HOSPITAL_COMMUNITY): Payer: Medicaid Other

## 2015-08-26 DIAGNOSIS — D649 Anemia, unspecified: Secondary | ICD-10-CM

## 2015-08-26 DIAGNOSIS — N319 Neuromuscular dysfunction of bladder, unspecified: Secondary | ICD-10-CM

## 2015-08-26 DIAGNOSIS — L89154 Pressure ulcer of sacral region, stage 4: Secondary | ICD-10-CM

## 2015-08-26 LAB — GLUCOSE, CAPILLARY
GLUCOSE-CAPILLARY: 101 mg/dL — AB (ref 65–99)
GLUCOSE-CAPILLARY: 125 mg/dL — AB (ref 65–99)
GLUCOSE-CAPILLARY: 126 mg/dL — AB (ref 65–99)
GLUCOSE-CAPILLARY: 151 mg/dL — AB (ref 65–99)
GLUCOSE-CAPILLARY: 71 mg/dL (ref 65–99)
GLUCOSE-CAPILLARY: 88 mg/dL (ref 65–99)
Glucose-Capillary: 130 mg/dL — ABNORMAL HIGH (ref 65–99)

## 2015-08-26 LAB — COMPREHENSIVE METABOLIC PANEL
ALBUMIN: 1.9 g/dL — AB (ref 3.5–5.0)
ALK PHOS: 109 U/L (ref 38–126)
ALT: 9 U/L — AB (ref 17–63)
AST: 15 U/L (ref 15–41)
Anion gap: 4 — ABNORMAL LOW (ref 5–15)
BILIRUBIN TOTAL: 0.2 mg/dL — AB (ref 0.3–1.2)
BUN: 31 mg/dL — AB (ref 6–20)
CALCIUM: 13 mg/dL — AB (ref 8.9–10.3)
CO2: 23 mmol/L (ref 22–32)
CREATININE: 1.34 mg/dL — AB (ref 0.61–1.24)
Chloride: 105 mmol/L (ref 101–111)
GFR calc Af Amer: >60 mL/min (ref >60)
GFR calc non Af Amer: >60 mL/min (ref >60)
GLUCOSE: 154 mg/dL — AB (ref 65–99)
Potassium: 4.3 mmol/L (ref 3.5–5.1)
Sodium: 132 mmol/L — ABNORMAL LOW (ref 135–145)
TOTAL PROTEIN: 7.4 g/dL (ref 6.5–8.1)

## 2015-08-26 LAB — CLOSTRIDIUM DIFFICILE BY PCR: Toxigenic C. Difficile by PCR: NEGATIVE

## 2015-08-26 LAB — CBC WITH DIFFERENTIAL/PLATELET
BASOS PCT: 0 %
Basophils Absolute: 0 10*3/uL (ref 0.0–0.1)
EOS ABS: 2.1 10*3/uL — AB (ref 0.0–0.7)
Eosinophils Relative: 7 %
HCT: 31.1 % — ABNORMAL LOW (ref 39.0–52.0)
HEMOGLOBIN: 9.4 g/dL — AB (ref 13.0–17.0)
LYMPHS ABS: 3.1 10*3/uL (ref 0.7–4.0)
LYMPHS PCT: 10 %
MCH: 26.3 pg (ref 26.0–34.0)
MCHC: 30.2 g/dL (ref 30.0–36.0)
MCV: 86.9 fL (ref 78.0–100.0)
MONO ABS: 2.4 10*3/uL — AB (ref 0.1–1.0)
Monocytes Relative: 8 %
NEUTROS ABS: 23 10*3/uL — AB (ref 1.7–7.7)
Neutrophils Relative %: 75 %
Platelets: 759 10*3/uL — ABNORMAL HIGH (ref 150–400)
RBC: 3.58 MIL/uL — ABNORMAL LOW (ref 4.22–5.81)
RDW: 14.2 % (ref 11.5–15.5)
WBC: 30.6 10*3/uL — ABNORMAL HIGH (ref 4.0–10.5)

## 2015-08-26 LAB — URINE CULTURE

## 2015-08-26 LAB — C DIFFICILE QUICK SCREEN W PCR REFLEX
C Diff antigen: POSITIVE — AB
C Diff toxin: NEGATIVE

## 2015-08-26 LAB — INFLUENZA PANEL BY PCR (TYPE A & B)
H1N1FLUPCR: NOT DETECTED
INFLAPCR: NEGATIVE
INFLBPCR: NEGATIVE

## 2015-08-26 MED ORDER — VANCOMYCIN 50 MG/ML ORAL SOLUTION
125.0000 mg | Freq: Four times a day (QID) | ORAL | Status: DC
  Administered 2015-08-26 – 2015-08-27 (×4): 125 mg via ORAL
  Filled 2015-08-26 (×7): qty 2.5

## 2015-08-26 MED ORDER — SODIUM CHLORIDE 0.9 % IJ SOLN
10.0000 mL | Freq: Two times a day (BID) | INTRAMUSCULAR | Status: DC
  Administered 2015-08-26 – 2015-08-28 (×6): 10 mL
  Administered 2015-08-29: 40 mL
  Administered 2015-08-30 – 2015-09-01 (×3): 10 mL

## 2015-08-26 MED ORDER — DEXTROSE 50 % IV SOLN
1.0000 | Freq: Once | INTRAVENOUS | Status: AC
  Administered 2015-08-26: 25 mL via INTRAVENOUS
  Filled 2015-08-26: qty 50

## 2015-08-26 MED ORDER — GLUCERNA 1.2 CAL PO LIQD
1000.0000 mL | ORAL | Status: DC
  Administered 2015-08-26 – 2015-08-31 (×6): 1000 mL
  Filled 2015-08-26 (×8): qty 1000

## 2015-08-26 MED ORDER — JEVITY 1.2 CAL PO LIQD: 1000.0000 mL | ORAL | Status: DC

## 2015-08-26 MED ORDER — CHLORHEXIDINE GLUCONATE 0.12 % MT SOLN
15.0000 mL | Freq: Two times a day (BID) | OROMUCOSAL | Status: DC
  Administered 2015-08-26 – 2015-09-02 (×12): 15 mL via OROMUCOSAL
  Filled 2015-08-26 (×11): qty 15

## 2015-08-26 MED ORDER — CETYLPYRIDINIUM CHLORIDE 0.05 % MT LIQD
7.0000 mL | Freq: Two times a day (BID) | OROMUCOSAL | Status: DC
  Administered 2015-08-26 – 2015-09-01 (×9): 7 mL via OROMUCOSAL

## 2015-08-26 MED ORDER — SODIUM CHLORIDE 0.9 % IJ SOLN: 10.0000 mL | INTRAMUSCULAR | Status: DC | PRN

## 2015-08-26 NOTE — Consult Note (Signed)
[facility]  Total days of antibiotics 2        Day 2 vanco IV        Day 2 imi               Reason for Consult:fever, leukocytosis    Referring Physician: gherghe  Principal Problem:   SIRS (systemic inflammatory response syndrome) ([name]) Active Problems:   Gastroparesis due to DM ([name])   Neurogenic bladder   Acute encephalopathy   Suprapubic catheter ([name])   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 ([place])   HCAP (healthcare-associated pneumonia)   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([place])   Severe protein-calorie malnutrition ([place])   Dysphagia, pharyngoesophageal phase   Chronic anemia   Uncontrolled type 1 diabetes mellitus with hyperglycemia ([name])   Tracheostomy dependent ([name])   Acute on chronic respiratory failure ([name])   Hypomagnesemia   Decubitus ulcer of right perineal ischial region, stage 4 ([name])   Decubitus ulcer of left perineal ischial region, stage 4 ([name])   Decubitus ulcer of sacral region, stage 4 ([name])   Severe dehydration   Acute kidney injury ([place])    HPI: DEMITRIS [name] is a 29 y.o. male well known to the ID service. His  PMHx of type 1 DM with gastoparesis and peripheral neuropathy,paraplegia c/b chronic sacral/pelvic osteomyelitis, recurrent UTIs associated with a chronic suprapubic catheter, and also has hx  Of various infections including: MDR Acinetobacter VAP/HCAP and MRSA and microaerophilic strep bacteremia and  MDR Pseudomonas aeruginosa UTI. He was admitted on 11/17 with fever, leukocytosis of 30K, with report of gradual altered mental status poor po intake. In addition to being more confused and has been running low-grade temperatures at night. He apparently has been complaining of diffuse abdominal pain.  In the ER the patient had an oral temperature 100.4, he was hypotensive upon presentation with a blood pressure 79/51, pulse was 90 respirations were 11. He was lethargic. He was requiring oxygen via trach with  sats 95%. He was given 2 L of fluid in the ER with some improvement in his blood pressure to 109/76. He was not having any output from his suprapubic catheter so this was changed by the ER physician.  Laboratory data revealed hyponatremia with sodium of 127 in setting of glucose 383, creatinine of 1.63 up from BL of  0.75. Lactic acid normal at 1.28 with a CRP of 16 and ESR of 127. WBC of 29K with 81%N. Blood cultures have been obtained. Chest x-ray revealed right-sided pneumonia with trach in appropriate position but showed right sided infiltrate. Stool tested + antigen/toxin -. No hx of recent tx for cdifficile.  His mother is at his bedside, who reports that he appears slightly better than admit  He underwent wound care evaluation. Note excerpt outlines status of his wounds:  Stage 4 pressure injuries right and left ischium and sacrum Stage 3 pressure injuries left heel x 4 Full thickness wound mid back  Measurement: L foot/dorsal: 1.0cm x 0.4cm x 0.1cm  L foot lateral: 1.5cm x 1.5cm x 0.1cm  Left heel two open areas however these were one wound previously, skin bridge now intact between 3.5cm x 1.5cm x 0.1cm and 1.0cm x 1.0 x 0.1cm  Left ischium: 8cm x 7cm x 1.5cm with 2.5cm undermining from 4-12 o'clock Right ischium: 5.5cm x 7.0cm x 2cm  Sacrum:9.0cm x 11cm x 3.0cm Midback: 10cm x 10cm with some re epithelization noted throughout the wound  Wound bed:  All of the wounds are clean, 100% granulation tissue except the sacrum which has a small amount of necrotic tissue less than 10%.  There is palpable bone in the bilateral ischial wounds and the sacral wound.  They are very clean, do not suspect these as a source of infection Drainage (amount, consistency, odor) moderate to heavy from the sacral wound and ischial wounds. Most likely due to chronicity.  Periwound:intact at all sites.  Left foot wounds are very, very superficial  Past Medical History  Diagnosis Date  . GERD  (gastroesophageal reflux disease)   . Asthma   . Hx MRSA infection     on face  . Gastroparesis   . Diabetic neuropathy ([place])   . Seizures ([place])   . Family history of anesthesia complication     Pt mother can't have epidural procedures  . Dysrhythmia   . Pneumonia   . Arthritis   . Fibromyalgia   . 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 Type I diabetes mellitus ([place])     sees Dr. Loanne Drilling   . Syncope 02/16/2015    Allergies:  Allergies  Allergen Reactions  . Cefuroxime Axetil Anaphylaxis  . Ertapenem Other (See Comments)    Rash and confusion-->tolerated Imipenem   . Morphine And Related Other (See Comments)    Changed mental status, confusion, headache, visual hallucination  . Penicillins Anaphylaxis and Other (See Comments)    Tolerated Imipenem; no reaction to 7 day course of amoxicillin in 2015 Has patient had a PCN reaction causing immediate rash, facial/tongue/throat swelling, SOB or lightheadedness with hypotension: Yes Has patient had a PCN reaction causing severe rash involving mucus membranes or skin necrosis: No Has patient had a PCN reaction that required hospitalization Yes Has patient had a PCN reaction occurring within the last 10 years: No If all of the above answers are "NO", then may proceed with Cephalo  . Sulfa Antibiotics Anaphylaxis, Shortness Of Breath and Other (See Comments)  . Tessalon [Benzonatate] Anaphylaxis  . Shellfish Allergy Itching and Other (See Comments)    Took benadryl to alleviate reaction  . Nsaids Other (See Comments)    Risk of bleeding  . Miripirium Rash and Other (See Comments)    Change in mental status   MEDICATIONS: . antiseptic oral rinse  7 mL Mouth Rinse q12n4p  . chlorhexidine  15 mL Mouth Rinse BID  . collagenase  1 application Topical Q M,W,F  . enoxaparin (LOVENOX) injection  40 mg Subcutaneous Q24H  . feeding supplement (GLUCERNA 1.2 CAL)  1,000 mL Per Tube Q24H  . [START ON 08/28/2015]  fentaNYL  50 mcg Transdermal Q72H  . free water  200 mL Per Tube 3 times per day  . imipenem-cilastatin  250 mg Intravenous 3 times per day  . insulin aspart  0-20 Units Subcutaneous 6 times per day  . insulin glargine  10 Units Subcutaneous Daily  . magnesium oxide  400 mg Oral BID  . midodrine  40 mg Per Tube TID WC  . multivitamin with minerals  1 tablet Per Tube Daily  . pantoprazole sodium  40 mg Per Tube Daily  . pregabalin  100 mg Per Tube TID AC  . pregabalin  200 mg Oral QHS  . saccharomyces boulardii  250 mg Oral BID  . sodium chloride  10-40 mL Intracatheter Q12H  . vancomycin  125 mg Oral QID    Social History  Substance Use Topics  . Smoking status:  Former Smoker -- 1.00 packs/day for 12 years    Types: Cigars, Cigarettes    Quit date: 09/07/2014  . Smokeless tobacco: Never Used  . Alcohol Use: No    Family History  Problem Relation Age of Onset  . Diabetes Father   . Hypertension Father   . Asthma      fhx  . Hypertension      fhx  . Stroke      fhx  . Heart disease Mother     Review of Systems -  Unable to obtain pt with altered mental status  OBJECTIVE: Temp:  [97.1 F (36.2 C)-98.9 F (37.2 C)] 98.9 F (37.2 C) (11/18 0800) Pulse Rate:  [62-76] 70 (11/18 0800) Resp:  [8-18] 10 (11/18 0800) BP: (71-121)/(56-93) 91/67 mmHg (11/18 0800) SpO2:  [97 %-100 %] 100 % (11/18 0800) FiO2 (%):  [21 %] 21 % (11/18 0755) Weight:  [110 lb 8 oz (50.122 kg)] 110 lb 8 oz (50.122 kg) (11/17 1344) Physical Exam  Constitutional: He is sleeping. He appears well-developed and well-nourished. No distress.  HENT:  Mouth/Throat: Oropharynx is clear and moist. No oropharyngeal exudate.  Cardiovascular: Normal rate, regular rhythm and normal heart sounds. Exam reveals no gallop and no friction rub.  No murmur heard.  Pulmonary/Chest: Effort normal and breath sounds normal. No respiratory distress. He has no wheezes.  Abdominal: Soft. Bowel sounds are normal. He  exhibits no distension. There is no tenderness. Colostomy in place Lymphadenopathy:  He has no cervical adenopathy.  Neurological: He is alert and oriented to person, place, and time.  Skin: did not visualize his wounds but he has decub wrapped to heals bilaterally, sacrum and upper back    LABS: Results for orders placed or performed during the hospital encounter of 08/25/15 (from the past 48 hour(s))  Blood Culture (routine x 2)     Status: None (Preliminary result)   Collection Time: 08/25/15  8:51 AM  Result Value Ref Range   Specimen Description BLOOD RIGHT ARM    Special Requests BOTTLES DRAWN AEROBIC AND ANAEROBIC 5CC    Culture NO GROWTH 1 DAY    Report Status PENDING   Comprehensive metabolic panel     Status: Abnormal   Collection Time: 08/25/15  8:56 AM  Result Value Ref Range   Sodium 127 (L) 135 - 145 mmol/L   Potassium 5.0 3.5 - 5.1 mmol/L   Chloride 96 (L) 101 - 111 mmol/L   CO2 24 22 - 32 mmol/L   Glucose, Bld 383 (H) 65 - 99 mg/dL   BUN 34 (H) 6 - 20 mg/dL   Creatinine, Ser 1.63 (H) 0.61 - 1.24 mg/dL   Calcium 14.8 (HH) 8.9 - 10.3 mg/dL    Comment: CRITICAL RESULT CALLED TO, READ BACK BY AND VERIFIED WITH: ROWE,C RN 11.17.16 [id number]    Total Protein 8.8 (H) 6.5 - 8.1 g/dL   Albumin 2.3 (L) 3.5 - 5.0 g/dL   AST 11 (L) 15 - 41 U/L   ALT 8 (L) 17 - 63 U/L   Alkaline Phosphatase 109 38 - 126 U/L   Total Bilirubin 0.3 0.3 - 1.2 mg/dL   GFR calc non Af Amer 55 (L) >60 mL/min   GFR calc Af Amer >60 >60 mL/min    Comment: (NOTE) The eGFR has been calculated using the CKD EPI equation. This calculation has not been validated in all clinical situations. eGFR's persistently <60 mL/min signify possible Chronic Kidney Disease.    Anion  gap 7 5 - 15  CBC WITH DIFFERENTIAL     Status: Abnormal   Collection Time: 08/25/15  8:56 AM  Result Value Ref Range   WBC 28.9 (H) 4.0 - 10.5 K/uL   RBC 4.03 (L) 4.22 - 5.81 MIL/uL   Hemoglobin 10.8 (L) 13.0 - 17.0 g/dL    HCT 34.7 (L) 39.0 - 52.0 %   MCV 86.1 78.0 - 100.0 fL   MCH 26.8 26.0 - 34.0 pg   MCHC 31.1 30.0 - 36.0 g/dL   RDW 14.2 11.5 - 15.5 %   Platelets 797 (H) 150 - 400 K/uL   Neutrophils Relative % 81 %   Lymphocytes Relative 8 %   Monocytes Relative 9 %   Eosinophils Relative 2 %   Basophils Relative 0 %   Neutro Abs 23.4 (H) 1.7 - 7.7 K/uL   Lymphs Abs 2.3 0.7 - 4.0 K/uL   Monocytes Absolute 2.6 (H) 0.1 - 1.0 K/uL   Eosinophils Absolute 0.6 0.0 - 0.7 K/uL   Basophils Absolute 0.0 0.0 - 0.1 K/uL   WBC Morphology ATYPICAL LYMPHOCYTES   Blood Culture (routine x 2)     Status: None (Preliminary result)   Collection Time: 08/25/15  8:56 AM  Result Value Ref Range   Specimen Description BLOOD LEFT WRIST    Special Requests BOTTLES DRAWN AEROBIC AND ANAEROBIC 5CC    Culture NO GROWTH 1 DAY    Report Status PENDING   I-Stat CG4 Lactic Acid, ED  (not at  [facility])     Status: None   Collection Time: 08/25/15  9:07 AM  Result Value Ref Range   Lactic Acid, Venous 1.28 0.5 - 2.0 mmol/L  C-reactive protein     Status: Abnormal   Collection Time: 08/25/15  9:38 AM  Result Value Ref Range   CRP 16.0 (H) <1.0 mg/dL  Sedimentation rate     Status: Abnormal   Collection Time: 08/25/15  9:38 AM  Result Value Ref Range   Sed Rate 124 (H) 0 - 16 mm/hr  Cortisol     Status: None   Collection Time: 08/25/15  9:38 AM  Result Value Ref Range   Cortisol, Plasma 19.3 ug/dL    Comment: (NOTE) AM    6.7 - 22.6 ug/dL PM   <10.0       ug/dL   Urinalysis, Routine w reflex microscopic (not at [facility])     Status: Abnormal   Collection Time: 08/25/15 11:45 AM  Result Value Ref Range   Color, Urine YELLOW YELLOW   APPearance CLOUDY (A) CLEAR   Specific Gravity, Urine 1.020 1.005 - 1.030   pH 6.0 5.0 - 8.0   Glucose, UA 100 (A) NEGATIVE mg/dL   Hgb urine dipstick LARGE (A) NEGATIVE   Bilirubin Urine NEGATIVE NEGATIVE   Ketones, ur NEGATIVE NEGATIVE mg/dL   Protein, ur 100 (A) NEGATIVE mg/dL   Nitrite  POSITIVE (A) NEGATIVE   Leukocytes, UA LARGE (A) NEGATIVE  Urine culture     Status: None   Collection Time: 08/25/15 11:45 AM  Result Value Ref Range   Specimen Description URINE, CATHETERIZED    Special Requests NONE    Culture MULTIPLE SPECIES PRESENT, SUGGEST RECOLLECTION    Report Status 08/26/2015 FINAL   Urine microscopic-add on     Status: Abnormal   Collection Time: 08/25/15 11:45 AM  Result Value Ref Range   Squamous Epithelial / LPF 0-5 (A) NONE SEEN    Comment: Please note change in  reference range.   WBC, UA TOO NUMEROUS TO COUNT 0 - 5 WBC/hpf    Comment: Please note change in reference range.   RBC / HPF 6-30 0 - 5 RBC/hpf    Comment: Please note change in reference range.   Bacteria, UA MANY (A) NONE SEEN    Comment: Please note change in reference range.   Casts HYALINE CASTS (A) NEGATIVE   Crystals CA OXALATE CRYSTALS (A) NEGATIVE   Urine-Other MICROSCOPIC EXAM PERFORMED ON UNCONCENTRATED URINE   CBG monitoring, ED     Status: Abnormal   Collection Time: 08/25/15 12:20 PM  Result Value Ref Range   Glucose-Capillary 301 (H) 65 - 99 mg/dL  MRSA PCR Screening     Status: None   Collection Time: 08/25/15  2:23 PM  Result Value Ref Range   MRSA by PCR NEGATIVE NEGATIVE    Comment:        The GeneXpert MRSA Assay (FDA approved for NASAL specimens only), is one component of a comprehensive MRSA colonization surveillance program. It is not intended to diagnose MRSA infection nor to guide or monitor treatment for MRSA infections.   Glucose, capillary     Status: Abnormal   Collection Time: 08/25/15  6:07 PM  Result Value Ref Range   Glucose-Capillary 166 (H) 65 - 99 mg/dL  Glucose, capillary     Status: Abnormal   Collection Time: 08/25/15  8:19 PM  Result Value Ref Range   Glucose-Capillary 217 (H) 65 - 99 mg/dL  Strep pneumoniae urinary antigen  (not at [facility])     Status: None   Collection Time: 08/25/15 11:19 PM  Result Value Ref Range   Strep Pneumo  Urinary Antigen NEGATIVE NEGATIVE    Comment:        Infection due to S. pneumoniae cannot be absolutely ruled out since the antigen present may be below the detection limit of the test.   C difficile quick scan w PCR reflex     Status: Abnormal   Collection Time: 08/25/15 11:20 PM  Result Value Ref Range   C Diff antigen POSITIVE (A) NEGATIVE   C Diff toxin NEGATIVE NEGATIVE   C Diff interpretation      C. difficile present, but toxin not detected. This indicates colonization. In most cases, this does not require treatment. If patient has signs and symptoms consistent with colitis, consider treatment. Requires ENTERIC precautions.  Influenza panel by PCR (type A & B, H1N1)     Status: None   Collection Time: 08/25/15 11:22 PM  Result Value Ref Range   Influenza A By PCR NEGATIVE NEGATIVE   Influenza B By PCR NEGATIVE NEGATIVE   H1N1 flu by pcr NOT DETECTED NOT DETECTED    Comment:        The Xpert Flu assay (FDA approved for nasal aspirates or washes and nasopharyngeal swab specimens), is intended as an aid in the diagnosis of influenza and should not be used as a sole basis for treatment.   Glucose, capillary     Status: Abnormal   Collection Time: 08/26/15 12:21 AM  Result Value Ref Range   Glucose-Capillary 151 (H) 65 - 99 mg/dL  Comprehensive metabolic panel     Status: Abnormal   Collection Time: 08/26/15  3:41 AM  Result Value Ref Range   Sodium 132 (L) 135 - 145 mmol/L   Potassium 4.3 3.5 - 5.1 mmol/L   Chloride 105 101 - 111 mmol/L   CO2 23 22 - 32  mmol/L   Glucose, Bld 154 (H) 65 - 99 mg/dL   BUN 31 (H) 6 - 20 mg/dL   Creatinine, Ser 1.34 (H) 0.61 - 1.24 mg/dL   Calcium 13.0 (H) 8.9 - 10.3 mg/dL   Total Protein 7.4 6.5 - 8.1 g/dL   Albumin 1.9 (L) 3.5 - 5.0 g/dL   AST 15 15 - 41 U/L   ALT 9 (L) 17 - 63 U/L   Alkaline Phosphatase 109 38 - 126 U/L   Total Bilirubin 0.2 (L) 0.3 - 1.2 mg/dL   GFR calc non Af Amer >60 >60 mL/min   GFR calc Af Amer >60 >60  mL/min    Comment: (NOTE) The eGFR has been calculated using the CKD EPI equation. This calculation has not been validated in all clinical situations. eGFR's persistently <60 mL/min signify possible Chronic Kidney Disease.    Anion gap 4 (L) 5 - 15  CBC WITH DIFFERENTIAL     Status: Abnormal   Collection Time: 08/26/15  3:41 AM  Result Value Ref Range   WBC 30.6 (H) 4.0 - 10.5 K/uL   RBC 3.58 (L) 4.22 - 5.81 MIL/uL   Hemoglobin 9.4 (L) 13.0 - 17.0 g/dL   HCT 31.1 (L) 39.0 - 52.0 %   MCV 86.9 78.0 - 100.0 fL   MCH 26.3 26.0 - 34.0 pg   MCHC 30.2 30.0 - 36.0 g/dL   RDW 14.2 11.5 - 15.5 %   Platelets 759 (H) 150 - 400 K/uL   Neutrophils Relative % 75 %   Lymphocytes Relative 10 %   Monocytes Relative 8 %   Eosinophils Relative 7 %   Basophils Relative 0 %   Neutro Abs 23.0 (H) 1.7 - 7.7 K/uL   Lymphs Abs 3.1 0.7 - 4.0 K/uL   Monocytes Absolute 2.4 (H) 0.1 - 1.0 K/uL   Eosinophils Absolute 2.1 (H) 0.0 - 0.7 K/uL   Basophils Absolute 0.0 0.0 - 0.1 K/uL   Smear Review MORPHOLOGY UNREMARKABLE   Glucose, capillary     Status: Abnormal   Collection Time: 08/26/15  4:16 AM  Result Value Ref Range   Glucose-Capillary 130 (H) 65 - 99 mg/dL    MICRO: 11/17 cdiff negative 11/17 blood cx ngtd IMAGING: Dg Chest Port 1 View  08/25/2015  CLINICAL DATA:  Fever. EXAM: PORTABLE CHEST 1 VIEW COMPARISON:  July 27, 2015. FINDINGS: Tracheostomy is in grossly good position. Cardiomediastinal silhouette appears normal. No pneumothorax or significant pleural effusion is noted. Left lung is clear. Right midlung and basilar opacity is noted concerning for pneumonia. Bony thorax is unremarkable. IMPRESSION: Right-sided pneumonia is noted.  Tracheostomy is in good position. Electronically Signed   By: Marijo Conception, M.D.   On: 08/25/2015 09:36   Assessment/Plan:  29yo M with paraplegia with suprapubic catheter and trach c/b sacral decub admitted for sepsis found to have infiltrate on CXR but also +  cdifficile in setting of leukocytosis of 30k, and abdominal pain.  1) possible c.difficile colitis vs. colonization= with leukocytosis of 30K plus abd pain, diarrhea. He is high risk for cdiff due to numerous abtx exposure. No recent hx of treatment for it.  recommend to treat with vancomycin 13m QID x 14 d for now. Will add on cdiff pcr for confirmation. If negative, can stop oral vanco per peg  2) right lobe pneumonia = recommend to get sputum culture and continue with imipenem, which would have some anaerobic coverage for aspiration pneumonia. He has allergies to many  abtx to preclude narrowing it.  3) decub ulcers = appreciate wound care assessment and recommendations. Continue to follow recs. Would address HOB issue to minimize risk for prevention of aspiration as well as emptying his bladder  Please have care management work on getting him a better bed at home, mother has details that can ease patient in the correct position to prevent aspiration pna

## 2015-08-26 NOTE — Evaluation (Signed)
Clinical/Bedside Swallow Evaluation Patient Details  Name: Jason [name] MRN: [id number] Date of Birth: 11/21/1985  Today's Date: 08/26/2015 Time: SLP Start Time (ACUTE ONLY): 1525 SLP Stop Time (ACUTE ONLY): 1545 SLP Time Calculation (min) (ACUTE ONLY): 20 min  Past Medical History:  Past Medical History  Diagnosis Date  . GERD (gastroesophageal reflux disease)   . Asthma   . Hx MRSA infection     on face  . Gastroparesis   . Diabetic neuropathy ([place])   . Seizures ([place])   . Family history of anesthesia complication     Pt mother can't have epidural procedures  . Dysrhythmia   . Pneumonia   . Arthritis   . Fibromyalgia   . Stroke ([place]) 01/29/2014    spinal stroke ; "quadriplegic since"  . Type I diabetes mellitus ([place])     sees Dr. Loanne Drilling   . Syncope 02/16/2015   Past Surgical History:  Past Surgical History  Procedure Laterality Date  . Tonsillectomy    . Multiple extractions with alveoloplasty N/A 08/03/2014    Procedure: MULTIPLE EXTRACTIONS;  Surgeon: Gae Bon, DDS;  Location: [place];  Service: Oral Surgery;  Laterality: N/A;  . Tee without cardioversion N/A 08/17/2014    Procedure: TRANSESOPHAGEAL ECHOCARDIOGRAM (TEE);  Surgeon: Dorothy Spark, MD;  Location: [facility] ENDOSCOPY;  Service: Cardiovascular;  Laterality: N/A;  . Debridment of decubitus ulcer N/A 10/04/2014    Procedure: DEBRIDMENT OF DECUBITUS ULCER;  Surgeon: Georganna Skeans, MD;  Location: [place];  Service: General;  Laterality: N/A;  . Laparoscopic diverted colostomy N/A 10/12/2014    Procedure: LAPAROSCOPIC DIVERTING COLOSTOMY;  Surgeon: Donnie Mesa, MD;  Location: [place];  Service: General;  Laterality: N/A;  . Insertion of suprapubic catheter N/A 10/12/2014    Procedure: INSERTION OF SUPRAPUBIC CATHETER;  Surgeon: Reece Packer, MD;  Location: [place];  Service: Urology;  Laterality: N/A;  . Incision and drainage of wound N/A 11/12/2014    Procedure: IRRIGATION AND DEBRIDEMENT OF WOUNDS WITH BONE  BIOPSY AND SURGICAL PREP ;  Surgeon: Theodoro Kos, DO;  Location: [place];  Service: Plastics;  Laterality: N/A;  . Application of a-cell of back N/A 11/12/2014    Procedure: APPLICATION A CELL AND VAC ;  Surgeon: Theodoro Kos, DO;  Location: [place];  Service: Plastics;  Laterality: N/A;  . Incision and drainage of wound N/A 11/18/2014    Procedure: IRRIGATION AND DEBRIDEMENT OF SACRAL ULCER ONLY WITH PLACEMENT OF A CELL AND VAC/ DRESSING CHANGE TO UPPER BACK AREA.;  Surgeon: Theodoro Kos, DO;  Location: [place];  Service: Plastics;  Laterality: N/A;  . Incision and drainage of wound N/A 11/25/2014    Procedure: IRRIGATION AND DEBRIDEMENT OF SACRAL ULCER AND BACK BURN WITH PLACEMENT OF A-CELL;  Surgeon: Theodoro Kos, DO;  Location: [place];  Service: Plastics;  Laterality: N/A;  . Minor application of wound vac N/A 11/25/2014    Procedure:  WOUND VAC CHANGE;  Surgeon: Theodoro Kos, DO;  Location: [place];  Service: Plastics;  Laterality: N/A;  . Incision and drainage of wound Right 12/08/2014    Procedure: IRRIGATION AND DEBRIDEMENT SACRAL WOUND AND RIGHT ISCHIAL WOUND ;  Surgeon: Theodoro Kos, DO;  Location: [place];  Service: Plastics;  Laterality: Right;  . Application of a-cell of back N/A 12/08/2014    Procedure: APPLICATION OF A-CELL AND WOUND VAC ;  Surgeon: Theodoro Kos, DO;  Location: [place];  Service: Plastics;  Laterality: N/A;  . Tee without cardioversion N/A  05/05/2015    Procedure: TRANSESOPHAGEAL ECHOCARDIOGRAM (TEE);  Surgeon: Lelon Perla, MD;  Location: [place];  Service: Cardiovascular;  Laterality: N/A;  . Incision and drainage of wound Bilateral 05/05/2015    Procedure: IRRIGATION AND DEBRIDEMENT SACRAL ULCER;  Surgeon: Theodoro Kos, DO;  Location: [place];  Service: Plastics;  Laterality: Bilateral;  . Hematoma evacuation N/A 05/05/2015    Procedure: EVACUATION HEMATOMA bedside procedure;  Surgeon: Theodoro Kos, DO;  Location: [place];  Service: Plastics;  Laterality: N/A;  . Incision  and drainage of wound N/A 08/04/2015    Procedure: IRRIGATION AND DEBRIDEMENT OF SACRAL ULCER AND ;  Surgeon: Loel Lofty Dillingham, DO;  Location: [place];  Service: Plastics;  Laterality: N/A;  . Application of a-cell of chest/abdomen N/A 08/04/2015    Procedure: APPLICATION OF A-CELL ;  Surgeon: Loel Lofty Dillingham, DO;  Location: [place];  Service: Plastics;  Laterality: N/A;   HPI:  29 y.o. male with history of spinal cord injury with tetraplegia, multiple non healing sacral wounds, PEG status, trach (currently has #6 Shiley), ostomy bag, suprapubic catheter, with multiple hospitalizations due to recurrent infections in his urine / decub ulcers / osteomyelitis admitted with AMS / fever / hypotension. CXR right-sided pneumonia is noted. Pt initiated po's during previous visit; MBS 07/15/15 Dys 2, honey liquids and MBS 07/20/15 recommended regular texture and thin liquids. MD suspects pna is from aspiration.   Assessment / Plan / Recommendation Clinical Impression  Pt familiar to this SLP from previous visit when he was consuming regular textures/thin liquids without diffiulty. Pt with speaking valve donned upon SLP arrival. Bolus manipulation and transfer WFL's; refused cracker. Swallow initaiton appeared timely, without s/s aspiration or pharyngeal residue. Mom reports pt eats/drinks in a more supine position due to limited mobility of head bed at home possibly causing or contributing to aspiration. Mom states case manager is investigating other options for bed. Recommend regular texture diet/thin liquids, straws allowed, pills whole in applesauce, passy-muir valve on and eat when alert. ST will briefly follow.     Aspiration Risk   (mild-mod)    Diet Recommendation   Regular/thin liquids  Medication Administration: Whole meds with puree    Other  Recommendations Oral Care Recommendations: Oral care BID   Follow up Recommendations  None    Frequency and Duration min 1 x/week  1 week        Swallow Study   General HPI: 29 y.o. male with history of spinal cord injury with tetraplegia, multiple non healing sacral wounds, PEG status, trach (currently has #6 Shiley), ostomy bag, suprapubic catheter, with multiple hospitalizations due to recurrent infections in his urine / decub ulcers / osteomyelitis admitted with AMS / fever / hypotension. CXR right-sided pneumonia is noted. Pt initiated po's during previous visit; MBS 07/15/15 Dys 2, honey liquids and MBS 07/20/15 recommended regular texture and thin liquids. MD suspects pna is from aspiration. Type of Study: Bedside Swallow Evaluation Previous Swallow Assessment:  (see HPI) Diet Prior to this Study: NPO Temperature Spikes Noted: No Respiratory Status: Trach Trach Size and Type: #6;Cuff;With PMSV in place History of Recent Intubation: No Behavior/Cognition: Lethargic/Drowsy;Cooperative Oral Cavity Assessment:  (? candida) Oral Care Completed by SLP: Yes Oral Cavity - Dentition: Poor condition (majority intact) Vision: Functional for self-feeding Self-Feeding Abilities: Able to feed self;Needs set up Patient Positioning: Upright in bed Baseline Vocal Quality: Normal Volitional Cough: Weak Volitional Swallow: Able to elicit    Oral/Motor/Sensory Function Overall Oral Motor/Sensory Function: Within functional  limits   Ice Chips Ice chips: Not tested   Thin Liquid Thin Liquid: Within functional limits Presentation: Cup;Straw    Nectar Thick Nectar Thick Liquid: Not tested   Honey Thick Honey Thick Liquid: Not tested   Puree Puree: Within functional limits (sherbert) Presentation: Spoon   Solid Solid:  (pt refused cracker)       Mick Sell, Orbie Pyo 08/26/2015,4:30 PM  Orbie Pyo Colvin Caroli.Ed [employer] [phone]

## 2015-08-26 NOTE — Progress Notes (Signed)
Peripherally Inserted Central Catheter/Midline Placement  The IV Nurse has discussed with the patient and/or persons authorized to consent for the patient, the purpose of this procedure and the potential benefits and risks involved with this procedure.  The benefits include less needle sticks, lab draws from the catheter and patient may be discharged home with the catheter.  Risks include, but not limited to, infection, bleeding, blood clot (thrombus formation), and puncture of an artery; nerve damage and irregular heat beat.  Alternatives to this procedure were also discussed.  PICC/Midline Placement Documentation        Jason [name] 08/26/2015, 11:40 AM Consent from mother by Jule Economy, RN

## 2015-08-26 NOTE — Progress Notes (Signed)
PATIENT DETAILS Name: Jason [name] Age: 29 y.o. Sex: male Date of Birth: Jan 23, 1986 Admit Date: 08/25/2015 Admitting Physician Costin Karlyne Greenspan, MD PCP:FRY,STEPHEN A, MD  Brief Narrative: Jason [name] is a 29 y.o. 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 Initial workup revealed possible HCAP on CXR-patient was started on Vancomycin and Imipenem and admitted to the hospital for further evaluation and treatment   Subjective: Was up most of the night-sleepy this morning. But awake and  alert-when easily awoken   Assessment/Plan: Principal Problem: Sepsis secondary to HCAP: Although likely secondary to HCAP-patient is at risk of having multiple sources of infection causing sepsis. For now continue with empiric vancomycin and Primaxin. Await cultures and ID input.  Active Problems: Acute encephalopathy: Seems to have resolved, likely secondary to above.  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 Supportive care.  Acute renal failure: Likely secondary to prerenal azotemia, improving with IV fluids.   Dysphagia: Reviewed prior speech therapy evaluation-was on a regular diet-but on PEG tube feedings nocturnally. Given right-sided pneumonia, will order repeat speech therapy evaluation before resuming oral intake.  Hyponatremia: Likely secondary to dehydration, resolving with IV fluids.  History of gastroparesis: Likely secondary to diabetes, no  vomiting-seem stable at present. Follow, if needed we will start Reglan.  Type 1 diabetes: CBGs are relatively stable-continue with Lantus 10 units and SSI. Follow and adjust accordingly  History of neurogenic bladder: Supra pubic catheter in place.  Severe protein calorie malnutrition: On nocturnal tube feedings, nutrition consulted.  Stage IV sacral ulcers/stage III left heel ulcer/Full thickness wound mid back: Or present prior to admission, seen by wound care RN  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. IVC filter placed 8/6. Not on A/C due to need for frequent debridements which have caused extensive bleeding, previously on apixaban-on prophylactic Lovenox  Chronic anemia, likely due to chronic disease: Hemoglobin stable.  History of spinal cord infarct with paraplegia, partial quadriplegia  Disposition: Remain inpatient  Antimicrobial agents  See below  Anti-infectives    Start     Dose/Rate Route Frequency Ordered Stop   08/25/15 1000  vancomycin (VANCOCIN) 500 mg in sodium chloride 0.9 % 100 mL IVPB     500 mg 100 mL/hr over 60 Minutes Intravenous  Once 08/25/15 0943 08/25/15 1151   08/25/15 0930  vancomycin (VANCOCIN) 500 mg in sodium chloride 0.9 % 100 mL IVPB     500 mg 100 mL/hr over 60 Minutes Intravenous Every 24 hours 08/25/15 0921     08/25/15 0930  imipenem-cilastatin (PRIMAXIN) 250 mg in sodium chloride 0.9 % 100 mL IVPB     250 mg 200 mL/hr over 30 Minutes Intravenous 3 times per day 08/25/15 [id number]        DVT Prophylaxis: Prophylactic Lovenox   Code Status: Full code  Family Communication Mother at bedside  Procedures: None  CONSULTS:  ID  Time spent 40 minutes-Greater than 50% of this time was spent in counseling, explanation of diagnosis, planning of  further management, and coordination of care.  MEDICATIONS: Scheduled Meds: . antiseptic oral rinse  7 mL Mouth Rinse q12n4p  . chlorhexidine  15 mL Mouth Rinse BID  . collagenase  1 application Topical  Q M,W,F  . enoxaparin (LOVENOX) injection  40 mg Subcutaneous Q24H  . feeding supplement (GLUCERNA 1.2 CAL)  1,000 mL Per Tube Q24H  . [START ON 08/28/2015] fentaNYL  50 mcg Transdermal Q72H  . free water  200 mL Per Tube 3 times per day  . imipenem-cilastatin  250 mg Intravenous 3 times per day  . insulin aspart  0-20 Units Subcutaneous 6 times per day  . insulin glargine  10 Units Subcutaneous Daily  . magnesium oxide  400 mg Oral BID  . midodrine  40 mg Per Tube TID WC  . multivitamin with minerals  1 tablet Per Tube Daily  . pantoprazole sodium  40 mg Per Tube Daily  . pregabalin  100 mg Per Tube TID AC  . pregabalin  200 mg Oral QHS  . saccharomyces boulardii  250 mg Oral BID  . vancomycin  500 mg Intravenous Q24H   Continuous Infusions: . sodium chloride 150 mL/hr at 08/26/15 0639   PRN Meds:.albuterol, LORazepam, mirtazapine, ondansetron, oxyCODONE    PHYSICAL EXAM: Vital signs in last 24 hours: Filed Vitals:   08/26/15 0405 08/26/15 0645 08/26/15 0755 08/26/15 0800  BP: 92/63 101/64 101/64 91/67  Pulse: 71 71 71 70  Temp: 98.5 F (36.9 C)   98.9 F (37.2 C)  TempSrc: Oral   Oral  Resp: 16 18 16 10   Height:      Weight:      SpO2: 98% 100% 100% 100%    Weight change:  Filed Weights   08/25/15 0910 08/25/15 1344  Weight: 56.7 kg (125 lb) 50.122 kg (110 lb 8 oz)   Body mass index is 16.81 kg/(m^2).   Gen Exam: Awake and alert with clear speech.  Neck:tracheostomy in place est: B/L Clear.  CVS: S1 S2 Regular, no murmurs.  Abdomen: soft, BS +, non tender, non distended. left lower quadrant colostomy  Neurologic: 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Skin: No Rash.   Wounds: see above  Intake/Output from previous day:  Intake/Output Summary (Last 24 hours) at 08/26/15 1134 Last data filed at 08/26/15 1100  Gross per 24 hour  Intake   4938 ml  Output   1350 ml  Net   3588 ml     LAB RESULTS: CBC  Recent Labs Lab 08/25/15 0856 08/26/15 0341  WBC 28.9* 30.6*  HGB 10.8*  9.4*  HCT 34.7* 31.1*  PLT 797* 759*  MCV 86.1 86.9  MCH 26.8 26.3  MCHC 31.1 30.2  RDW 14.2 14.2  LYMPHSABS 2.3 3.1  MONOABS 2.6* 2.4*  EOSABS 0.6 2.1*  BASOSABS 0.0 0.0    Chemistries   Recent Labs Lab 08/25/15 0856 08/26/15 0341  NA 127* 132*  K 5.0 4.3  CL 96* 105  CO2 24 23  GLUCOSE 383* 154*  BUN 34* 31*  CREATININE 1.63* 1.34*  CALCIUM 14.8* 13.0*    CBG:  Recent Labs Lab 08/25/15 1220 08/25/15 1807 08/25/15 2019 08/26/15 0021 08/26/15 0416  GLUCAP 301* 166* 217* 151* 130*    GFR Estimated Creatinine Clearance: 56.6 mL/min (by C-G formula based on Cr of 1.34).  Coagulation profile No results for input(s): INR, PROTIME in the last 168 hours.  Cardiac Enzymes No results for input(s): CKMB, TROPONINI, MYOGLOBIN in the last 168 hours.  Invalid input(s): CK  Invalid input(s): POCBNP No results for input(s): DDIMER in the last 72 hours. No results for input(s): HGBA1C in the last 72 hours. No results for input(s): CHOL, HDL, LDLCALC, TRIG, CHOLHDL, LDLDIRECT in the last 72 hours. No results for input(s): TSH, T4TOTAL, T3FREE, THYROIDAB in the last 72 hours.  Invalid input(s): FREET3 No results for input(s): VITAMINB12, FOLATE, FERRITIN, TIBC, IRON, RETICCTPCT in the last 72 hours. No results for input(s): LIPASE, AMYLASE in the last 72 hours.  Urine Studies No results for input(s): UHGB, CRYS in the last 72 hours.  Invalid input(s): UACOL, UAPR, USPG, UPH, UTP, UGL, UKET, UBIL, UNIT, UROB, ULEU, UEPI, UWBC, URBC, UBAC, CAST, UCOM, BILUA  MICROBIOLOGY: Recent Results (from the past 240 hour(s))  Blood Culture (routine x 2)     Status: None (Preliminary result)   Collection Time: 08/25/15  8:51 AM  Result Value Ref Range Status   Specimen Description BLOOD RIGHT ARM  Final   Special Requests BOTTLES DRAWN AEROBIC AND ANAEROBIC 5CC  Final   Culture NO GROWTH 1 DAY  Final   Report Status PENDING  Incomplete  Blood Culture (routine x 2)      Status: None (Preliminary result)   Collection Time: 08/25/15  8:56 AM  Result Value Ref Range Status   Specimen Description BLOOD LEFT WRIST  Final   Special Requests BOTTLES DRAWN AEROBIC AND ANAEROBIC 5CC  Final   Culture NO GROWTH 1 DAY  Final   Report Status PENDING  Incomplete  Urine culture     Status: None   Collection Time: 08/25/15 11:45 AM  Result Value Ref Range Status   Specimen Description URINE, CATHETERIZED  Final   Special Requests NONE  Final   Culture MULTIPLE SPECIES PRESENT, SUGGEST RECOLLECTION  Final   Report Status 08/26/2015 FINAL  Final  MRSA PCR Screening     Status: None   Collection Time: 08/25/15  2:23 PM  Result Value Ref Range Status   MRSA by PCR NEGATIVE NEGATIVE Final    Comment:        The GeneXpert MRSA Assay (FDA approved for NASAL specimens only), is one component of a comprehensive MRSA colonization surveillance program. It is not intended to diagnose MRSA infection nor to guide or monitor treatment for MRSA infections.   C difficile quick scan w PCR reflex     Status: Abnormal   Collection Time: 08/25/15 11:20 PM  Result Value Ref Range Status   C Diff antigen POSITIVE (A) NEGATIVE Final   C Diff toxin NEGATIVE NEGATIVE Final   C Diff interpretation   Final    C. difficile present, but toxin not detected. This indicates colonization. In most cases, this does not require treatment. If patient has signs and symptoms consistent with colitis, consider treatment. Requires ENTERIC precautions.    RADIOLOGY STUDIES/RESULTS: Dg Chest Port 1 View  08/25/2015  CLINICAL DATA:  Fever. EXAM: PORTABLE CHEST 1 VIEW COMPARISON:  July 27, 2015. FINDINGS: Tracheostomy is in grossly good position. Cardiomediastinal silhouette appears normal. No pneumothorax or significant pleural effusion is noted. Left lung is clear. Right midlung and basilar opacity is noted concerning for pneumonia. Bony thorax is unremarkable. IMPRESSION: Right-sided pneumonia is  noted.  Tracheostomy is in good position. Electronically Signed   By: Marijo Conception, M.D.   On: 08/25/2015 09:36    Oren Binet, MD  [facility] Pager:[phone]  If 7PM-7AM, please contact night-coverage [email or web address] Password [email or web address] 08/26/2015, 11:34 AM   LOS: 1 day

## 2015-08-26 NOTE — Progress Notes (Signed)
Initial Nutrition Assessment  DOCUMENTATION CODES:   Underweight, Severe malnutrition in context of chronic illness  INTERVENTION:    Continue Glucerna 1.2 formula at 70 ml/hr from 6 PM to 6 AM.  Total time is 12 hours.   Continue MVI with minerals daily  NUTRITION DIAGNOSIS:   Inadequate oral intake related to chronic illness as evidenced by  (supplemental TF via PEG tube)  GOAL:   Patient will meet greater than or equal to 90% of their needs  MONITOR:   TF tolerance, PO intake, Labs, Weight trends, Skin, I & O's  REASON FOR ASSESSMENT:   Consult Assessment of nutrition requirement/status  ASSESSMENT:   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 Initial workup revealed possible HCAP on CXR for which will be started on Vancomycin and Imipenem for now. He appears to be very dehydrated, with decreased UOP, hyperglycemic and AKI. Will receive IV fluids. ID was consulted. Will rule out influenza given fever and CXR infiltrate, C diff given abdominal discomfort and loose stools, check UA (although likely to be positive). He has just been in the hospital for a little over a month and discharged 9 days ago.   Patient known to Clinical Nutrition during most recent hospitalization.  Pt and Mother sleeping soundly upon RD visit.    During most recent hospitalization, pt was taking Regular diet (s/p MBSS 10/12).  Also was receiving supplemental nocturnal TF with Vital AF 1.2 formula via PEG tube.  Pt with hx of variable PO intake of 25-100%.    Current ordered TF regimen: Glucerna 1.2  formula at 70 ml/hr x 12 hours (infusion time unknown).  Free water flushes at 200 ml every 8 hours.  CWOCN note 11/17 reviewed.  Diet Order:  Diet NPO time specified  Skin:      Left plantar heel with stage 2 pressure injury Left plantar foot near heel with unstageable pressure injury Left anterior foot near small toe stage 2 pressure injury  Left outer foot stage 2 pressure injury  Right ischium with stage 4 pressure injury Left ischium with stage 4 pressure injury Left hip with stage 4 pressure injury Left posterior back with chronic full thickness wound  Last BM:  11/18  Height:   Ht Readings from Last 1 Encounters:  08/25/15 5\' 8"  (1.727 m)    Weight:   Wt Readings from Last 1 Encounters:  08/25/15 110 lb 8 oz (50.122 kg)    Ideal Body Weight:  70 kg  BMI:  Body mass index is 16.81 kg/(m^2).  Estimated Nutritional Needs:   Kcal:  1900-2100  Protein:  95-110 gm  Fluid:  1.9-2.1 L  EDUCATION NEEDS:   No education needs identified at this time   CBG (last 3)   Recent Labs  08/25/15 2019 08/26/15 0021 08/26/15 0416  GLUCAP 217* 151* 130*    Arthur Holms, RD, LDN Pager #: [phone] After-Hours Pager #: [phone]

## 2015-08-26 NOTE — Care Management Note (Signed)
Case Management Note  Patient Details  Name: Jason [name] MRN: [id number] Date of Birth: 19-Feb-1986  Subjective/Objective:   Pt lives with mother, has private duty nursing services through [facility] 16 hrs/day.  Air Fluidized Therapy bed was ordered from [facility] per [place] recommendation, mother states head of bed does not elevate, wedges do not work, and pt is constantly in prone position.  CM will contact Medical [facility] about issue.                           Expected Discharge Plan:  [place]  Discharge planning Services  CM Consult  Post Acute Care Choice:  Resumption of Svcs/PTA Provider  Status of Service:  In process, will continue to follow  Girard Cooter, RN 08/26/2015, 2:10 PM

## 2015-08-27 DIAGNOSIS — A419 Sepsis, unspecified organism: Secondary | ICD-10-CM | POA: Insufficient documentation

## 2015-08-27 DIAGNOSIS — E1143 Type 2 diabetes mellitus with diabetic autonomic (poly)neuropathy: Secondary | ICD-10-CM

## 2015-08-27 DIAGNOSIS — R6520 Severe sepsis without septic shock: Secondary | ICD-10-CM

## 2015-08-27 DIAGNOSIS — K3184 Gastroparesis: Secondary | ICD-10-CM

## 2015-08-27 DIAGNOSIS — G8250 Quadriplegia, unspecified: Secondary | ICD-10-CM

## 2015-08-27 DIAGNOSIS — E10621 Type 1 diabetes mellitus with foot ulcer: Secondary | ICD-10-CM

## 2015-08-27 DIAGNOSIS — L97519 Non-pressure chronic ulcer of other part of right foot with unspecified severity: Secondary | ICD-10-CM

## 2015-08-27 DIAGNOSIS — L97529 Non-pressure chronic ulcer of other part of left foot with unspecified severity: Secondary | ICD-10-CM

## 2015-08-27 LAB — FUNGUS CULTURE W SMEAR: Fungal Smear: NONE SEEN

## 2015-08-27 LAB — GLUCOSE, CAPILLARY
GLUCOSE-CAPILLARY: 119 mg/dL — AB (ref 65–99)
GLUCOSE-CAPILLARY: 153 mg/dL — AB (ref 65–99)
GLUCOSE-CAPILLARY: 83 mg/dL (ref 65–99)
GLUCOSE-CAPILLARY: 105 mg/dL — AB (ref 65–99)
Glucose-Capillary: 140 mg/dL — ABNORMAL HIGH (ref 65–99)
Glucose-Capillary: 93 mg/dL (ref 65–99)

## 2015-08-27 LAB — COMPREHENSIVE METABOLIC PANEL
ALBUMIN: 1.9 g/dL — AB (ref 3.5–5.0)
ALK PHOS: 103 U/L (ref 38–126)
ALT: 8 U/L — ABNORMAL LOW (ref 17–63)
ANION GAP: 5 (ref 5–15)
AST: 11 U/L — ABNORMAL LOW (ref 15–41)
BILIRUBIN TOTAL: 0.4 mg/dL (ref 0.3–1.2)
BUN: 22 mg/dL — ABNORMAL HIGH (ref 6–20)
CALCIUM: 12.9 mg/dL — AB (ref 8.9–10.3)
CO2: 24 mmol/L (ref 22–32)
Chloride: 107 mmol/L (ref 101–111)
Creatinine, Ser: 1.18 mg/dL (ref 0.61–1.24)
GFR calc Af Amer: >60 mL/min (ref >60)
GFR calc non Af Amer: >60 mL/min (ref >60)
GLUCOSE: 120 mg/dL — AB (ref 65–99)
Potassium: 3.9 mmol/L (ref 3.5–5.1)
Sodium: 136 mmol/L (ref 135–145)
TOTAL PROTEIN: 7.8 g/dL (ref 6.5–8.1)

## 2015-08-27 MED ORDER — PROMETHAZINE HCL 25 MG/ML IJ SOLN
12.5000 mg | Freq: Four times a day (QID) | INTRAMUSCULAR | Status: DC | PRN
Start: 2015-08-27 — End: 2015-09-02
  Administered 2015-08-27 – 2015-08-28 (×2): 12.5 mg via INTRAVENOUS
  Filled 2015-08-27 (×2): qty 1

## 2015-08-27 NOTE — Progress Notes (Signed)
PATIENT DETAILS Name: Jason [name] Age: 29 y.o. Sex: male Date of Birth: Jun 16, 1986 Admit Date: 08/25/2015 Admitting Physician Costin Karlyne Greenspan, MD PCP:FRY,STEPHEN A, MD  Brief Narrative: Jason [name] is a 29 y.o. 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 Initial workup revealed possible HCAP on CXR-patient was started on Vancomycin and Imipenem and admitted to the hospital for further evaluation and treatment   Subjective: Much improved-awake alert. Low-grade fever yesterday.  Assessment/Plan: Principal Problem: Sepsis secondary to HCAP: Although likely secondary to HCAP-blood cultures negative so far, urine culture shows multiple bacterial morphotypes. Infectious disease following, antibiotics now narrative just to Primaxin. Follow clinical course.   Active Problems: Acute encephalopathy: Resolved, likely secondary to above.  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 Supportive care.  Acute renal failure: Likely secondary to prerenal azotemiresolvedwith IV fluids.   Dysphagia: seen by speech therapy-started on a regular diet.   Hyponatremia: resolved with IV fluids. likely secondary to dehydration   History of gastroparesis: Likely secondary to diabetes, no vomiting-seem stable at present. Follow, if needed we will start Reglan.  Type 1 diabetes: CBGs stable-continue with Lantus 10 units and SSI. Follow and adjust accordingly  History of  neurogenic bladder: Supra pubic catheter in place.  Severe protein calorie malnutrition: On nocturnal tube feedings, nutrition consulted.  Stage IV sacral ulcers/stage III left heel ulcer/Full thickness wound mid back:present prior to admission, seen by wound care RN  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. IVC filter placed 8/6. Not on A/C due to need for frequent debridements which have caused extensive bleeding, previously on apixaban-on prophylactic Lovenox  Chronic anemia, likely due to chronic disease: Hemoglobin stable.  History of spinal cord infarct with paraplegia, partial quadriplegia  Disposition: Remain inpatient-remain in step down  Antimicrobial agents  See below  Anti-infectives    Start     Dose/Rate Route Frequency Ordered Stop   08/26/15 1400  vancomycin (VANCOCIN) 50 mg/mL oral solution 125 mg  Status:  Discontinued     125 mg Oral 4 times daily 08/26/15 1157 08/27/15 1047   08/25/15 1000  vancomycin (VANCOCIN) 500 mg in sodium chloride 0.9 % 100 mL IVPB     500 mg 100 mL/hr over 60 Minutes Intravenous  Once 08/25/15 0943 08/25/15 1151   08/25/15 0930  vancomycin (VANCOCIN) 500 mg in sodium chloride 0.9 % 100 mL IVPB  Status:  Discontinued     500 mg 100 mL/hr over 60 Minutes Intravenous Every 24 hours 08/25/15 0921 08/26/15 1157   08/25/15 0930  imipenem-cilastatin (PRIMAXIN) 250 mg in sodium chloride 0.9 % 100 mL IVPB     250 mg 200 mL/hr over 30 Minutes Intravenous 3 times per day 08/25/15 [id number]        DVT Prophylaxis: Prophylactic Lovenox   Code Status: Full code  Family Communication Mother at bedside  Procedures: None  CONSULTS:  ID  Time spent 30 minutes-Greater than 50% of this time was spent in counseling, explanation of diagnosis, planning  of further management, and coordination of care.  MEDICATIONS: Scheduled Meds: . antiseptic oral rinse  7 mL Mouth Rinse q12n4p  . chlorhexidine  15 mL Mouth Rinse BID  . collagenase  1 application Topical  Q M,W,F  . enoxaparin (LOVENOX) injection  40 mg Subcutaneous Q24H  . feeding supplement (GLUCERNA 1.2 CAL)  1,000 mL Per Tube Q24H  . [START ON 08/28/2015] fentaNYL  50 mcg Transdermal Q72H  . free water  200 mL Per Tube 3 times per day  . imipenem-cilastatin  250 mg Intravenous 3 times per day  . insulin aspart  0-20 Units Subcutaneous 6 times per day  . insulin glargine  10 Units Subcutaneous Daily  . magnesium oxide  400 mg Oral BID  . midodrine  40 mg Per Tube TID WC  . multivitamin with minerals  1 tablet Per Tube Daily  . pantoprazole sodium  40 mg Per Tube Daily  . pregabalin  100 mg Per Tube TID AC  . pregabalin  200 mg Oral QHS  . saccharomyces boulardii  250 mg Oral BID  . sodium chloride  10-40 mL Intracatheter Q12H   Continuous Infusions: . sodium chloride 10 mL/hr at 08/27/15 0803   PRN Meds:.albuterol, LORazepam, mirtazapine, ondansetron, oxyCODONE, sodium chloride    PHYSICAL EXAM: Vital signs in last 24 hours: Filed Vitals:   08/27/15 0500 08/27/15 0800 08/27/15 0900 08/27/15 1200  BP:  113/75    Pulse:  83 79 80  Temp:  99.8 F (37.7 C)    TempSrc:  Oral    Resp:  21 16 14   Height:      Weight: 53.3 kg (117 lb 8.1 oz)     SpO2:  100% 100% 100%    Weight change: -3.4 kg (-7 lb 7.9 oz) Filed Weights   08/25/15 0910 08/25/15 1344 08/27/15 0500  Weight: 56.7 kg (125 lb) 50.122 kg (110 lb 8 oz) 53.3 kg (117 lb 8.1 oz)   Body mass index is 17.87 kg/(m^2).   Gen Exam: Awake and alert with clear speech.  Neck:tracheostomy in place est: B/L Clear.  CVS: S1 S2 Regular, no murmurs.  Abdomen: soft, BS +, non tender, non distended. left lower quadrant colostomy  Neurologic: 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Skin: No Rash.   Wounds: see above  Intake/Output from previous day:  Intake/Output Summary (Last 24 hours) at 08/27/15 1247 Last data filed at 08/27/15 0609  Gross per 24 hour  Intake 2452.83 ml  Output   3050 ml  Net -597.17 ml     LAB RESULTS: CBC  Recent  Labs Lab 08/25/15 0856 08/26/15 0341  WBC 28.9* 30.6*  HGB 10.8* 9.4*  HCT 34.7* 31.1*  PLT 797* 759*  MCV 86.1 86.9  MCH 26.8 26.3  MCHC 31.1 30.2  RDW 14.2 14.2  LYMPHSABS 2.3 3.1  MONOABS 2.6* 2.4*  EOSABS 0.6 2.1*  BASOSABS 0.0 0.0    Chemistries   Recent Labs Lab 08/25/15 0856 08/26/15 0341 08/27/15 0350  NA 127* 132* 136  K 5.0 4.3 3.9  CL 96* 105 107  CO2 24 23 24   GLUCOSE 383* 154* 120*  BUN 34* 31* 22*  CREATININE 1.63* 1.34* 1.18  CALCIUM 14.8* 13.0* 12.9*    CBG:  Recent Labs Lab 08/26/15 1651 08/26/15 1956 08/26/15 2355 08/27/15 0406 08/27/15 0806  GLUCAP 126* 125* 93 119* 153*    GFR Estimated Creatinine Clearance: 68.4 mL/min (by C-G formula based on Cr of 1.18).  Coagulation profile No results for input(s): INR, PROTIME  in the last 168 hours.  Cardiac Enzymes No results for input(s): CKMB, TROPONINI, MYOGLOBIN in the last 168 hours.  Invalid input(s): CK  Invalid input(s): POCBNP No results for input(s): DDIMER in the last 72 hours. No results for input(s): HGBA1C in the last 72 hours. No results for input(s): CHOL, HDL, LDLCALC, TRIG, CHOLHDL, LDLDIRECT in the last 72 hours. No results for input(s): TSH, T4TOTAL, T3FREE, THYROIDAB in the last 72 hours.  Invalid input(s): FREET3 No results for input(s): VITAMINB12, FOLATE, FERRITIN, TIBC, IRON, RETICCTPCT in the last 72 hours. No results for input(s): LIPASE, AMYLASE in the last 72 hours.  Urine Studies No results for input(s): UHGB, CRYS in the last 72 hours.  Invalid input(s): UACOL, UAPR, USPG, UPH, UTP, UGL, UKET, UBIL, UNIT, UROB, ULEU, UEPI, UWBC, URBC, UBAC, CAST, UCOM, BILUA  MICROBIOLOGY: Recent Results (from the past 240 hour(s))  Blood Culture (routine x 2)     Status: None (Preliminary result)   Collection Time: 08/25/15  8:51 AM  Result Value Ref Range Status   Specimen Description BLOOD RIGHT ARM  Final   Special Requests BOTTLES DRAWN AEROBIC AND ANAEROBIC  5CC  Final   Culture NO GROWTH 1 DAY  Final   Report Status PENDING  Incomplete  Blood Culture (routine x 2)     Status: None (Preliminary result)   Collection Time: 08/25/15  8:56 AM  Result Value Ref Range Status   Specimen Description BLOOD LEFT WRIST  Final   Special Requests BOTTLES DRAWN AEROBIC AND ANAEROBIC 5CC  Final   Culture NO GROWTH 1 DAY  Final   Report Status PENDING  Incomplete  Urine culture     Status: None   Collection Time: 08/25/15 11:45 AM  Result Value Ref Range Status   Specimen Description URINE, CATHETERIZED  Final   Special Requests NONE  Final   Culture MULTIPLE SPECIES PRESENT, SUGGEST RECOLLECTION  Final   Report Status 08/26/2015 FINAL  Final  MRSA PCR Screening     Status: None   Collection Time: 08/25/15  2:23 PM  Result Value Ref Range Status   MRSA by PCR NEGATIVE NEGATIVE Final    Comment:        The GeneXpert MRSA Assay (FDA approved for NASAL specimens only), is one component of a comprehensive MRSA colonization surveillance program. It is not intended to diagnose MRSA infection nor to guide or monitor treatment for MRSA infections.   C difficile quick scan w PCR reflex     Status: Abnormal   Collection Time: 08/25/15 11:20 PM  Result Value Ref Range Status   C Diff antigen POSITIVE (A) NEGATIVE Final   C Diff toxin NEGATIVE NEGATIVE Final   C Diff interpretation   Final    C. difficile present, but toxin not detected. This indicates colonization. In most cases, this does not require treatment. If patient has signs and symptoms consistent with colitis, consider treatment. Requires ENTERIC precautions.  Clostridium Difficile by PCR     Status: None   Collection Time: 08/25/15 11:20 PM  Result Value Ref Range Status   Toxigenic C Difficile by pcr NEGATIVE NEGATIVE Final    RADIOLOGY STUDIES/RESULTS: Dg Chest Port 1 View  08/26/2015  CLINICAL DATA:  Status post right PICC placement today. EXAM: PORTABLE CHEST 1 VIEW COMPARISON:   Single view of the chest 08/25/2015. FINDINGS: Right PICC is in place with the tip projecting over the lower superior vena cava. Tracheostomy tube is again seen. Airspace disease in the right  mid and lower lung zones persists. Aeration in the right base appears slightly improved. The left lung is clear. Heart size is normal. No pneumothorax. IMPRESSION: Tip of right PICC projects over the lower superior vena cava. Persistent airspace disease in the right shows mild improvement in the lung base. Electronically Signed   By: Inge Rise M.D.   On: 08/26/2015 12:45   Dg Chest Port 1 View  08/25/2015  CLINICAL DATA:  Fever. EXAM: PORTABLE CHEST 1 VIEW COMPARISON:  July 27, 2015. FINDINGS: Tracheostomy is in grossly good position. Cardiomediastinal silhouette appears normal. No pneumothorax or significant pleural effusion is noted. Left lung is clear. Right midlung and basilar opacity is noted concerning for pneumonia. Bony thorax is unremarkable. IMPRESSION: Right-sided pneumonia is noted.  Tracheostomy is in good position. Electronically Signed   By: Marijo Conception, M.D.   On: 08/25/2015 09:36    Oren Binet, MD  [facility] Pager:[phone]  If 7PM-7AM, please contact night-coverage [email or web address] Password [email or web address] 08/27/2015, 12:47 PM   LOS: 2 days

## 2015-08-27 NOTE — Progress Notes (Signed)
[facility]    Subjective: "I feel a lot better"   Antibiotics:  Anti-infectives    Start     Dose/Rate Route Frequency Ordered Stop   08/26/15 1400  vancomycin (VANCOCIN) 50 mg/mL oral solution 125 mg  Status:  Discontinued     125 mg Oral 4 times daily 08/26/15 1157 08/27/15 1047   08/25/15 1000  vancomycin (VANCOCIN) 500 mg in sodium chloride 0.9 % 100 mL IVPB     500 mg 100 mL/hr over 60 Minutes Intravenous  Once 08/25/15 0943 08/25/15 1151   08/25/15 0930  vancomycin (VANCOCIN) 500 mg in sodium chloride 0.9 % 100 mL IVPB  Status:  Discontinued     500 mg 100 mL/hr over 60 Minutes Intravenous Every 24 hours 08/25/15 0921 08/26/15 1157   08/25/15 0930  imipenem-cilastatin (PRIMAXIN) 250 mg in sodium chloride 0.9 % 100 mL IVPB     250 mg 200 mL/hr over 30 Minutes Intravenous 3 times per day 08/25/15 [id number]        Medications: Scheduled Meds: . antiseptic oral rinse  7 mL Mouth Rinse q12n4p  . chlorhexidine  15 mL Mouth Rinse BID  . collagenase  1 application Topical Q M,W,F  . enoxaparin (LOVENOX) injection  40 mg Subcutaneous Q24H  . feeding supplement (GLUCERNA 1.2 CAL)  1,000 mL Per Tube Q24H  . [START ON 08/28/2015] fentaNYL  50 mcg Transdermal Q72H  . free water  200 mL Per Tube 3 times per day  . imipenem-cilastatin  250 mg Intravenous 3 times per day  . insulin aspart  0-20 Units Subcutaneous 6 times per day  . insulin glargine  10 Units Subcutaneous Daily  . magnesium oxide  400 mg Oral BID  . midodrine  40 mg Per Tube TID WC  . multivitamin with minerals  1 tablet Per Tube Daily  . pantoprazole sodium  40 mg Per Tube Daily  . pregabalin  100 mg Per Tube TID AC  . pregabalin  200 mg Oral QHS  . saccharomyces boulardii  250 mg Oral BID  . sodium chloride  10-40 mL Intracatheter Q12H   Continuous Infusions: . sodium chloride 10 mL/hr at 08/27/15 0803   PRN Meds:.albuterol, LORazepam, mirtazapine, ondansetron,  oxyCODONE, sodium chloride    Objective: Weight change: -7 lb 7.9 oz (-3.4 kg)  Intake/Output Summary (Last 24 hours) at 08/27/15 1214 Last data filed at 08/27/15 0609  Gross per 24 hour  Intake 2452.83 ml  Output   3050 ml  Net -597.17 ml   Blood pressure 113/75, pulse 79, temperature 99.8 F (37.7 C), temperature source Oral, resp. rate 16, height 5\' 8"  (1.727 m), weight 117 lb 8.1 oz (53.3 kg), SpO2 100 %. Temp:  [99 F (37.2 C)-100.1 F (37.8 C)] 99.8 F (37.7 C) (11/19 0800) Pulse Rate:  [77-84] 79 (11/19 0900) Resp:  [14-22] 16 (11/19 0900) BP: (113-176)/(75-100) 113/75 mmHg (11/19 0800) SpO2:  [100 %] 100 % (11/19 0900) FiO2 (%):  [21 %] 21 % (11/18 2030) Weight:  [117 lb 8.1 oz (53.3 kg)] 117 lb 8.1 oz (53.3 kg) (11/19 0500)  Physical Exam: General: Alert and awake, oriented x3, watching video on his phone  HEENT: anicteric sclera, pupils reactive to light and accommodation, EOMI  Neuro: nonfocal  CBC: CBC Latest Ref Rng 08/26/2015 08/25/2015 08/16/2015  WBC 4.0 - 10.5 K/uL 30.6(H) 28.9(H) 18.4(H)  Hemoglobin 13.0 - 17.0 g/dL 9.4(L) 10.8(L)  9.5(L)  Hematocrit 39.0 - 52.0 % 31.1(L) 34.7(L) 30.8(L)  Platelets 150 - 400 K/uL 759(H) 797(H) 841(H)       BMET  Recent Labs  08/26/15 0341 08/27/15 0350  NA 132* 136  K 4.3 3.9  CL 105 107  CO2 23 24  GLUCOSE 154* 120*  BUN 31* 22*  CREATININE 1.34* 1.18  CALCIUM 13.0* 12.9*     Liver Panel   Recent Labs  08/26/15 0341 08/27/15 0350  PROT 7.4 7.8  ALBUMIN 1.9* 1.9*  AST 15 11*  ALT 9* 8*  ALKPHOS 109 103  BILITOT 0.2* 0.4       Sedimentation Rate  Recent Labs  08/25/15 0938  ESRSEDRATE 124*   C-Reactive Protein  Recent Labs  08/25/15 0938  CRP 16.0*    Micro Results: Recent Results (from the past 720 hour(s))  Culture, Urine     Status: None   Collection Time: 07/29/15  6:06 PM  Result Value Ref Range Status   Specimen Description URINE, SUPRAPUBIC  Final   Special  Requests Normal  Final   Culture   Final    >=100,000 COLONIES/mL PSEUDOMONAS AERUGINOSA AMIKACIN MIC=8 SENSITIVE FOSFOMYCIN MIC=>1024 RESISTANT AVYCAZ=22 SENSITIVE ZERBAXA=1 SENSITIVE    Report Status 08/17/2015 FINAL  Final   Organism ID, Bacteria PSEUDOMONAS AERUGINOSA  Final      Susceptibility   Pseudomonas aeruginosa - MIC*    CEFTAZIDIME 16 INTERMEDIATE Intermediate     CIPROFLOXACIN >=4 RESISTANT Resistant     GENTAMICIN >=16 RESISTANT Resistant     IMIPENEM >=16 RESISTANT Resistant     CEFEPIME 16 INTERMEDIATE Intermediate     * >=100,000 COLONIES/mL PSEUDOMONAS AERUGINOSA  Anaerobic culture     Status: None   Collection Time: 08/04/15  7:08 PM  Result Value Ref Range Status   Specimen Description TISSUE LEFT SACRAL  Final   Special Requests NONE  Final   Gram Stain   Final    NO WBC SEEN NO ORGANISMS SEEN Performed at [facility]    Culture   Final    NO ANAEROBES ISOLATED Performed at [facility]    Report Status 08/09/2015 FINAL  Final  Fungus Culture with Smear     Status: None (Preliminary result)   Collection Time: 08/04/15  7:11 PM  Result Value Ref Range Status   Specimen Description TISSUE LEFT SACRAL  Final   Special Requests NONE  Final   Fungal Smear   Final    NO YEAST OR FUNGAL ELEMENTS SEEN Performed at [facility]    Culture   Final    CANDIDA ALBICANS Performed at [facility]    Report Status PENDING  Incomplete  AFB culture with smear     Status: None (Preliminary result)   Collection Time: 08/04/15  7:13 PM  Result Value Ref Range Status   Specimen Description TISSUE LEFT SACRAL  Final   Special Requests NONE  Final   Acid Fast Smear   Final    NO ACID FAST BACILLI SEEN Performed at [facility]    Culture   Final    CULTURE WILL BE EXAMINED FOR 6 WEEKS BEFORE ISSUING A FINAL REPORT Performed at [facility]    Report Status PENDING  Incomplete  Tissue culture     Status: None     Collection Time: 08/04/15  7:18 PM  Result Value Ref Range Status   Specimen Description TISSUE LEFT SACRAL  Final   Special Requests  NONE  Final   Gram Stain   Final    NO WBC SEEN NO ORGANISMS SEEN Performed at [facility]    Culture   Final    MODERATE ESCHERICHIA COLI Performed at [facility]    Report Status 08/15/2015 FINAL  Final   Organism ID, Bacteria ESCHERICHIA COLI  Final      Susceptibility   Escherichia coli - MIC*    AMPICILLIN >=32 RESISTANT Resistant     AMPICILLIN/SULBACTAM >=32 RESISTANT Resistant     CEFAZOLIN 8 RESISTANT Resistant     CEFEPIME <=1 SENSITIVE Sensitive     CEFTAZIDIME <=1 SENSITIVE Sensitive     CEFTRIAXONE <=1 SENSITIVE Sensitive     CIPROFLOXACIN <=0.25 SENSITIVE Sensitive     GENTAMICIN <=1 SENSITIVE Sensitive     IMIPENEM <=0.25 SENSITIVE Sensitive     PIP/TAZO <=4 SENSITIVE Sensitive     TOBRAMYCIN <=1 SENSITIVE Sensitive     TRIMETH/SULFA Value in next row Sensitive      <=20 SENSITIVE(NOTE)    * MODERATE ESCHERICHIA COLI  Culture, Urine     Status: None   Collection Time: 08/10/15 10:59 AM  Result Value Ref Range Status   Specimen Description URINE, SUPRAPUBIC  Final   Special Requests NONE  Final   Culture MULTIPLE SPECIES PRESENT, SUGGEST RECOLLECTION  Final   Report Status 08/12/2015 FINAL  Final  Culture, blood (single)     Status: None   Collection Time: 08/13/15 11:45 AM  Result Value Ref Range Status   Specimen Description BLOOD LEFT HAND  Final   Special Requests BOTTLES DRAWN AEROBIC ONLY 5CC  Final   Culture NO GROWTH 5 DAYS  Final   Report Status 08/18/2015 FINAL  Final  C difficile quick scan w PCR reflex     Status: Abnormal   Collection Time: 08/13/15  9:54 PM  Result Value Ref Range Status   C Diff antigen POSITIVE (A) NEGATIVE Final   C Diff toxin NEGATIVE NEGATIVE Final   C Diff interpretation   Final    C. difficile present, but toxin not detected. This indicates colonization. In most  cases, this does not require treatment. If patient has signs and symptoms consistent with colitis, consider treatment. Requires ENTERIC precautions.  Blood Culture (routine x 2)     Status: None (Preliminary result)   Collection Time: 08/25/15  8:51 AM  Result Value Ref Range Status   Specimen Description BLOOD RIGHT ARM  Final   Special Requests BOTTLES DRAWN AEROBIC AND ANAEROBIC 5CC  Final   Culture NO GROWTH 1 DAY  Final   Report Status PENDING  Incomplete  Blood Culture (routine x 2)     Status: None (Preliminary result)   Collection Time: 08/25/15  8:56 AM  Result Value Ref Range Status   Specimen Description BLOOD LEFT WRIST  Final   Special Requests BOTTLES DRAWN AEROBIC AND ANAEROBIC 5CC  Final   Culture NO GROWTH 1 DAY  Final   Report Status PENDING  Incomplete  Urine culture     Status: None   Collection Time: 08/25/15 11:45 AM  Result Value Ref Range Status   Specimen Description URINE, CATHETERIZED  Final   Special Requests NONE  Final   Culture MULTIPLE SPECIES PRESENT, SUGGEST RECOLLECTION  Final   Report Status 08/26/2015 FINAL  Final  MRSA PCR Screening     Status: None   Collection Time: 08/25/15  2:23 PM  Result Value Ref Range Status   MRSA by  PCR NEGATIVE NEGATIVE Final    Comment:        The GeneXpert MRSA Assay (FDA approved for NASAL specimens only), is one component of a comprehensive MRSA colonization surveillance program. It is not intended to diagnose MRSA infection nor to guide or monitor treatment for MRSA infections.   C difficile quick scan w PCR reflex     Status: Abnormal   Collection Time: 08/25/15 11:20 PM  Result Value Ref Range Status   C Diff antigen POSITIVE (A) NEGATIVE Final   C Diff toxin NEGATIVE NEGATIVE Final   C Diff interpretation   Final    C. difficile present, but toxin not detected. This indicates colonization. In most cases, this does not require treatment. If patient has signs and symptoms consistent with colitis,  consider treatment. Requires ENTERIC precautions.  Clostridium Difficile by PCR     Status: None   Collection Time: 08/25/15 11:20 PM  Result Value Ref Range Status   Toxigenic C Difficile by pcr NEGATIVE NEGATIVE Final    Studies/Results: Dg Chest Port 1 View  08/26/2015  CLINICAL DATA:  Status post right PICC placement today. EXAM: PORTABLE CHEST 1 VIEW COMPARISON:  Single view of the chest 08/25/2015. FINDINGS: Right PICC is in place with the tip projecting over the lower superior vena cava. Tracheostomy tube is again seen. Airspace disease in the right mid and lower lung zones persists. Aeration in the right base appears slightly improved. The left lung is clear. Heart size is normal. No pneumothorax. IMPRESSION: Tip of right PICC projects over the lower superior vena cava. Persistent airspace disease in the right shows mild improvement in the lung base. Electronically Signed   By: Inge Rise M.D.   On: 08/26/2015 12:45      Assessment/Plan:  INTERVAL HISTORY:  08/27/15: Cdiff negative    Principal Problem:   SIRS (systemic inflammatory response syndrome) ([name]) Active Problems:   Gastroparesis due to DM ([name])   Neurogenic bladder   Acute encephalopathy   Suprapubic catheter ([name])   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 ([place])   HCAP (healthcare-associated pneumonia)   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([name])   Severe protein-calorie malnutrition ([name])   Dysphagia, pharyngoesophageal phase   Chronic anemia   Uncontrolled type 1 diabetes mellitus with hyperglycemia ([name])   Tracheostomy dependent ([name])   Acute on chronic respiratory failure ([name])   Hypomagnesemia   Decubitus ulcer of right perineal ischial region, stage 4 ([name])   Decubitus ulcer of left perineal ischial region, stage 4 ([name])   Decubitus ulcer of sacral region, stage 4 ([name])   Severe dehydration   Acute kidney injury ([place])    Jason [name] is a 29 y.o. male with  paraplegia with  suprapubic catheter and trach c/b sacral decub admitted for sepsis found to have infiltrate on CXR but also reportedly +cdifficile in setting of leukocytosis of 30k, and abdominal pain. He was started on vancomycin yesterday. C diff is actually negative from 2 days ago.  He states that his output is actually NOT increased and that it was more problem of ostomy bag breaking due to malfunction  #1 ? CDI: will stop vanomcyin and observe  #2 RLL: continue IMI  #3 Chronic decubitus ulcers: greatly appreciate wound care help and plastics. See Dr. Storm Frisk note re issues with [facility] with current bed. Mom would like to procure at "[facility] or something similar"    LOS: 2 days   [name] 08/27/2015, 12:14 PM

## 2015-08-28 DIAGNOSIS — R509 Fever, unspecified: Secondary | ICD-10-CM

## 2015-08-28 LAB — RESPIRATORY VIRUS PANEL
Adenovirus: NEGATIVE
INFLUENZA A: NEGATIVE
INFLUENZA B 1: NEGATIVE
METAPNEUMOVIRUS: NEGATIVE
PARAINFLUENZA 1 A: NEGATIVE
PARAINFLUENZA 3 A: NEGATIVE
Parainfluenza 2: NEGATIVE
Respiratory Syncytial Virus A: NEGATIVE
Respiratory Syncytial Virus B: NEGATIVE
Rhinovirus: NEGATIVE

## 2015-08-28 LAB — CBC
HCT: 30.7 % — ABNORMAL LOW (ref 39.0–52.0)
Hemoglobin: 9.5 g/dL — ABNORMAL LOW (ref 13.0–17.0)
MCH: 26.2 pg (ref 26.0–34.0)
MCHC: 30.9 g/dL (ref 30.0–36.0)
MCV: 84.6 fL (ref 78.0–100.0)
PLATELETS: 681 10*3/uL — AB (ref 150–400)
RBC: 3.63 MIL/uL — ABNORMAL LOW (ref 4.22–5.81)
RDW: 14.1 % (ref 11.5–15.5)
WBC: 23.6 10*3/uL — AB (ref 4.0–10.5)

## 2015-08-28 LAB — BASIC METABOLIC PANEL
ANION GAP: 6 (ref 5–15)
BUN: 24 mg/dL — ABNORMAL HIGH (ref 6–20)
CALCIUM: 12.5 mg/dL — AB (ref 8.9–10.3)
CO2: 26 mmol/L (ref 22–32)
Chloride: 98 mmol/L — ABNORMAL LOW (ref 101–111)
Creatinine, Ser: 1.29 mg/dL — ABNORMAL HIGH (ref 0.61–1.24)
Glucose, Bld: 116 mg/dL — ABNORMAL HIGH (ref 65–99)
Potassium: 4.3 mmol/L (ref 3.5–5.1)
Sodium: 130 mmol/L — ABNORMAL LOW (ref 135–145)

## 2015-08-28 LAB — GLUCOSE, CAPILLARY
GLUCOSE-CAPILLARY: 122 mg/dL — AB (ref 65–99)
GLUCOSE-CAPILLARY: 135 mg/dL — AB (ref 65–99)
GLUCOSE-CAPILLARY: 167 mg/dL — AB (ref 65–99)
GLUCOSE-CAPILLARY: 77 mg/dL (ref 65–99)
Glucose-Capillary: 103 mg/dL — ABNORMAL HIGH (ref 65–99)
Glucose-Capillary: 153 mg/dL — ABNORMAL HIGH (ref 65–99)

## 2015-08-28 MED ORDER — MIDODRINE HCL 5 MG PO TABS
20.0000 mg | ORAL_TABLET | Freq: Three times a day (TID) | ORAL | Status: DC
  Administered 2015-08-28 – 2015-09-02 (×13): 20 mg
  Filled 2015-08-28 (×15): qty 4

## 2015-08-28 NOTE — Progress Notes (Signed)
[place] for Infectious Disease    Subjective: No new symptoms   Antibiotics:  Anti-infectives    Start     Dose/Rate Route Frequency Ordered Stop   08/26/15 1400  vancomycin (VANCOCIN) 50 mg/mL oral solution 125 mg  Status:  Discontinued     125 mg Oral 4 times daily 08/26/15 1157 08/27/15 1047   08/25/15 1000  vancomycin (VANCOCIN) 500 mg in sodium chloride 0.9 % 100 mL IVPB     500 mg 100 mL/hr over 60 Minutes Intravenous  Once 08/25/15 0943 08/25/15 1151   08/25/15 0930  vancomycin (VANCOCIN) 500 mg in sodium chloride 0.9 % 100 mL IVPB  Status:  Discontinued     500 mg 100 mL/hr over 60 Minutes Intravenous Every 24 hours 08/25/15 0921 08/26/15 1157   08/25/15 0930  imipenem-cilastatin (PRIMAXIN) 250 mg in sodium chloride 0.9 % 100 mL IVPB     250 mg 200 mL/hr over 30 Minutes Intravenous 3 times per day 08/25/15 [id number]        Medications: Scheduled Meds: . antiseptic oral rinse  7 mL Mouth Rinse q12n4p  . chlorhexidine  15 mL Mouth Rinse BID  . collagenase  1 application Topical Q M,W,F  . enoxaparin (LOVENOX) injection  40 mg Subcutaneous Q24H  . feeding supplement (GLUCERNA 1.2 CAL)  1,000 mL Per Tube Q24H  . fentaNYL  50 mcg Transdermal Q72H  . free water  200 mL Per Tube 3 times per day  . imipenem-cilastatin  250 mg Intravenous 3 times per day  . insulin aspart  0-20 Units Subcutaneous 6 times per day  . insulin glargine  10 Units Subcutaneous Daily  . magnesium oxide  400 mg Oral BID  . midodrine  20 mg Per Tube TID WC  . multivitamin with minerals  1 tablet Per Tube Daily  . pantoprazole sodium  40 mg Per Tube Daily  . pregabalin  100 mg Per Tube TID AC  . pregabalin  200 mg Oral QHS  . saccharomyces boulardii  250 mg Oral BID  . sodium chloride  10-40 mL Intracatheter Q12H   Continuous Infusions: . sodium chloride 40 mL/hr at 08/28/15 0905   PRN Meds:.albuterol, LORazepam, mirtazapine, ondansetron, oxyCODONE, promethazine, sodium  chloride    Objective: Weight change: 0 lb (0 kg)  Intake/Output Summary (Last 24 hours) at 08/28/15 1704 Last data filed at 08/28/15 0800  Gross per 24 hour  Intake   2290 ml  Output   3150 ml  Net   -860 ml   Blood pressure 126/86, pulse 72, temperature 98.9 F (37.2 C), temperature source Oral, resp. rate 14, height 5\' 8"  (1.727 m), weight 117 lb 8.1 oz (53.3 kg), SpO2 98 %. Temp:  [98.4 F (36.9 C)-100.9 F (38.3 C)] 98.9 F (37.2 C) (11/20 1600) Pulse Rate:  [71-79] 72 (11/20 1614) Resp:  [14-20] 14 (11/20 1614) BP: (102-168)/(67-109) 126/86 mmHg (11/20 1600) SpO2:  [96 %-100 %] 98 % (11/20 1614) Weight:  [117 lb 8.1 oz (53.3 kg)] 117 lb 8.1 oz (53.3 kg) (11/20 0441)  Physical Exam: General: Alert and awake, oriented x3, watching video on his phone  HEENT: anicteric sclera, pupils reactive to light and accommodation, EOMI Pulmonary: Diminished breath sounds at the bases Neuro: nonfocal  CBC: CBC Latest Ref Rng 08/28/2015 08/26/2015 08/25/2015  WBC 4.0 - 10.5 K/uL 23.6(H) 30.6(H) 28.9(H)  Hemoglobin 13.0 - 17.0 g/dL 9.5(L) 9.4(L) 10.8(L)  Hematocrit 39.0 -  52.0 % 30.7(L) 31.1(L) 34.7(L)  Platelets 150 - 400 K/uL 681(H) 759(H) 797(H)       BMET  Recent Labs  08/27/15 0350 08/28/15 0453  NA 136 130*  K 3.9 4.3  CL 107 98*  CO2 24 26  GLUCOSE 120* 116*  BUN 22* 24*  CREATININE 1.18 1.29*  CALCIUM 12.9* 12.5*     Liver Panel   Recent Labs  08/26/15 0341 08/27/15 0350  PROT 7.4 7.8  ALBUMIN 1.9* 1.9*  AST 15 11*  ALT 9* 8*  ALKPHOS 109 103  BILITOT 0.2* 0.4       Sedimentation Rate No results for input(s): ESRSEDRATE in the last 72 hours. C-Reactive Protein No results for input(s): CRP in the last 72 hours.  Micro Results: Recent Results (from the past 720 hour(s))  Culture, Urine     Status: None   Collection Time: 07/29/15  6:06 PM  Result Value Ref Range Status   Specimen Description URINE, SUPRAPUBIC  Final   Special  Requests Normal  Final   Culture   Final    >=100,000 COLONIES/mL PSEUDOMONAS AERUGINOSA AMIKACIN MIC=8 SENSITIVE FOSFOMYCIN MIC=>1024 RESISTANT AVYCAZ=22 SENSITIVE ZERBAXA=1 SENSITIVE    Report Status 08/17/2015 FINAL  Final   Organism ID, Bacteria PSEUDOMONAS AERUGINOSA  Final      Susceptibility   Pseudomonas aeruginosa - MIC*    CEFTAZIDIME 16 INTERMEDIATE Intermediate     CIPROFLOXACIN >=4 RESISTANT Resistant     GENTAMICIN >=16 RESISTANT Resistant     IMIPENEM >=16 RESISTANT Resistant     CEFEPIME 16 INTERMEDIATE Intermediate     * >=100,000 COLONIES/mL PSEUDOMONAS AERUGINOSA  Anaerobic culture     Status: None   Collection Time: 08/04/15  7:08 PM  Result Value Ref Range Status   Specimen Description TISSUE LEFT SACRAL  Final   Special Requests NONE  Final   Gram Stain   Final    NO WBC SEEN NO ORGANISMS SEEN Performed at [facility]    Culture   Final    NO ANAEROBES ISOLATED Performed at [facility]    Report Status 08/09/2015 FINAL  Final  Fungus Culture with Smear     Status: None   Collection Time: 08/04/15  7:11 PM  Result Value Ref Range Status   Specimen Description TISSUE LEFT SACRAL  Final   Special Requests NONE  Final   Fungal Smear   Final    NO YEAST OR FUNGAL ELEMENTS SEEN Performed at [facility]    Culture   Final    CANDIDA ALBICANS Performed at [facility]    Report Status 08/27/2015 FINAL  Final  AFB culture with smear     Status: None (Preliminary result)   Collection Time: 08/04/15  7:13 PM  Result Value Ref Range Status   Specimen Description TISSUE LEFT SACRAL  Final   Special Requests NONE  Final   Acid Fast Smear   Final    NO ACID FAST BACILLI SEEN Performed at [facility]    Culture   Final    CULTURE WILL BE EXAMINED FOR 6 WEEKS BEFORE ISSUING A FINAL REPORT Performed at [facility]    Report Status PENDING  Incomplete  Tissue culture     Status: None   Collection  Time: 08/04/15  7:18 PM  Result Value Ref Range Status   Specimen Description TISSUE LEFT SACRAL  Final   Special Requests NONE  Final   Gram Stain  Final    NO WBC SEEN NO ORGANISMS SEEN Performed at [facility]    Culture   Final    MODERATE ESCHERICHIA COLI Performed at [facility]    Report Status 08/15/2015 FINAL  Final   Organism ID, Bacteria ESCHERICHIA COLI  Final      Susceptibility   Escherichia coli - MIC*    AMPICILLIN >=32 RESISTANT Resistant     AMPICILLIN/SULBACTAM >=32 RESISTANT Resistant     CEFAZOLIN 8 RESISTANT Resistant     CEFEPIME <=1 SENSITIVE Sensitive     CEFTAZIDIME <=1 SENSITIVE Sensitive     CEFTRIAXONE <=1 SENSITIVE Sensitive     CIPROFLOXACIN <=0.25 SENSITIVE Sensitive     GENTAMICIN <=1 SENSITIVE Sensitive     IMIPENEM <=0.25 SENSITIVE Sensitive     PIP/TAZO <=4 SENSITIVE Sensitive     TOBRAMYCIN <=1 SENSITIVE Sensitive     TRIMETH/SULFA Value in next row Sensitive      <=20 SENSITIVE(NOTE)    * MODERATE ESCHERICHIA COLI  Culture, Urine     Status: None   Collection Time: 08/10/15 10:59 AM  Result Value Ref Range Status   Specimen Description URINE, SUPRAPUBIC  Final   Special Requests NONE  Final   Culture MULTIPLE SPECIES PRESENT, SUGGEST RECOLLECTION  Final   Report Status 08/12/2015 FINAL  Final  Culture, blood (single)     Status: None   Collection Time: 08/13/15 11:45 AM  Result Value Ref Range Status   Specimen Description BLOOD LEFT HAND  Final   Special Requests BOTTLES DRAWN AEROBIC ONLY 5CC  Final   Culture NO GROWTH 5 DAYS  Final   Report Status 08/18/2015 FINAL  Final  C difficile quick scan w PCR reflex     Status: Abnormal   Collection Time: 08/13/15  9:54 PM  Result Value Ref Range Status   C Diff antigen POSITIVE (A) NEGATIVE Final   C Diff toxin NEGATIVE NEGATIVE Final   C Diff interpretation   Final    C. difficile present, but toxin not detected. This indicates colonization. In most cases, this  does not require treatment. If patient has signs and symptoms consistent with colitis, consider treatment. Requires ENTERIC precautions.  Blood Culture (routine x 2)     Status: None (Preliminary result)   Collection Time: 08/25/15  8:51 AM  Result Value Ref Range Status   Specimen Description BLOOD RIGHT ARM  Final   Special Requests BOTTLES DRAWN AEROBIC AND ANAEROBIC 5CC  Final   Culture NO GROWTH 3 DAYS  Final   Report Status PENDING  Incomplete  Blood Culture (routine x 2)     Status: None (Preliminary result)   Collection Time: 08/25/15  8:56 AM  Result Value Ref Range Status   Specimen Description BLOOD LEFT WRIST  Final   Special Requests BOTTLES DRAWN AEROBIC AND ANAEROBIC 5CC  Final   Culture NO GROWTH 3 DAYS  Final   Report Status PENDING  Incomplete  Urine culture     Status: None   Collection Time: 08/25/15 11:45 AM  Result Value Ref Range Status   Specimen Description URINE, CATHETERIZED  Final   Special Requests NONE  Final   Culture MULTIPLE SPECIES PRESENT, SUGGEST RECOLLECTION  Final   Report Status 08/26/2015 FINAL  Final  MRSA PCR Screening     Status: None   Collection Time: 08/25/15  2:23 PM  Result Value Ref Range Status   MRSA by PCR NEGATIVE NEGATIVE Final    Comment:  The GeneXpert MRSA Assay (FDA approved for NASAL specimens only), is one component of a comprehensive MRSA colonization surveillance program. It is not intended to diagnose MRSA infection nor to guide or monitor treatment for MRSA infections.   C difficile quick scan w PCR reflex     Status: Abnormal   Collection Time: 08/25/15 11:20 PM  Result Value Ref Range Status   C Diff antigen POSITIVE (A) NEGATIVE Final   C Diff toxin NEGATIVE NEGATIVE Final   C Diff interpretation   Final    C. difficile present, but toxin not detected. This indicates colonization. In most cases, this does not require treatment. If patient has signs and symptoms consistent with colitis, consider  treatment. Requires ENTERIC precautions.  Respiratory virus antigens panel     Status: None   Collection Time: 08/25/15 11:20 PM  Result Value Ref Range Status   Source - RVPAN NASOPHARYNGEAL  Corrected   Respiratory Syncytial Virus A Negative Negative Final   Respiratory Syncytial Virus B Negative Negative Final   Influenza A Negative Negative Final   Influenza B Negative Negative Final   Parainfluenza 1 Negative Negative Final   Parainfluenza 2 Negative Negative Final   Parainfluenza 3 Negative Negative Final   Metapneumovirus Negative Negative Final   Rhinovirus Negative Negative Final   Adenovirus Negative Negative Final    Comment: (NOTE) Performed At: [facility] [place], [place] [id number] Lindon Romp MD [id number]   Clostridium Difficile by PCR     Status: None   Collection Time: 08/25/15 11:20 PM  Result Value Ref Range Status   Toxigenic C Difficile by pcr NEGATIVE NEGATIVE Final    Studies/Results: No results found.    Assessment/Plan:  INTERVAL HISTORY:  08/27/15: Cdiff negative  08/28/2015: Remains afebrile off vancomycin orally   Principal Problem:   SIRS (systemic inflammatory response syndrome) ([name]) Active Problems:   Gastroparesis due to DM ([name])   Neurogenic bladder   Acute encephalopathy   Suprapubic catheter ([place])   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 ([place])   HCAP (healthcare-associated pneumonia)   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([place])   Severe protein-calorie malnutrition ([name])   Dysphagia, pharyngoesophageal phase   Chronic anemia   Uncontrolled type 1 diabetes mellitus with hyperglycemia ([name])   Tracheostomy dependent ([name])   Acute on chronic respiratory failure ([name])   Hypomagnesemia   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 male with  paraplegia with suprapubic catheter and trach c/b sacral decub admitted for sepsis found to have infiltrate on CXR but also reportedly +cdifficile in setting of leukocytosis of 30k, and abdominal pain. He was started on vancomycin yesterday. C diff is actually negative from 2 days ago.  He stated Saturday that his output is actually NOT increased and that it was more problem of ostomy bag breaking due to malfunction  #1 ? CDI: Test was negative on 17th  And doing fine without oral vancomycin  #2 RLL: continue IMIPENEM to complete 7 days of antibiotics (3 more days)  #3 Chronic decubitus ulcers: greatly appreciate wound care help and plastics. See Dr. Storm Frisk note re issues with [facility] with current bed. Mom would like to procure at "  [facility] or something similar"  I was going to propose changing his chronic oral antibiotic to bactrim but there is listing of severe allergy to sulfa though pt told me he has taken bactrim I would like proof of this first  IF pt and Mom can furnish proof of no bactrim allergy in which case would send him out on bactrim DS one Po BID indefinitely   IF NOT would resume doxycyline at [place]  I will sign off for now. Dr. Baxter Flattery will be available for questions during the week.   LOS: 3 days   [name] 08/28/2015, 5:04 PM

## 2015-08-28 NOTE — Progress Notes (Signed)
ANTIBIOTIC CONSULT NOTE   Pharmacy Consult for imipenem Indication: sepsis  Allergies  Allergen Reactions  . Cefuroxime Axetil Anaphylaxis  . Ertapenem Other (See Comments)    Rash and confusion-->tolerated Imipenem   . Morphine And Related Other (See Comments)    Changed mental status, confusion, headache, visual hallucination  . Penicillins Anaphylaxis and Other (See Comments)    Tolerated Imipenem; no reaction to 7 day course of amoxicillin in 2015 Has patient had a PCN reaction causing immediate rash, facial/tongue/throat swelling, SOB or lightheadedness with hypotension: Yes Has patient had a PCN reaction causing severe rash involving mucus membranes or skin necrosis: No Has patient had a PCN reaction that required hospitalization Yes Has patient had a PCN reaction occurring within the last 10 years: No If all of the above answers are "NO", then may proceed with Cephalo  . Sulfa Antibiotics Anaphylaxis, Shortness Of Breath and Other (See Comments)  . Tessalon [Benzonatate] Anaphylaxis  . Shellfish Allergy Itching and Other (See Comments)    Took benadryl to alleviate reaction  . Nsaids Other (See Comments)    Risk of bleeding  . Miripirium Rash and Other (See Comments)    Change in mental status    Patient Measurements: Height: 5\' 8"  (172.7 cm) Weight: 117 lb 8.1 oz (53.3 kg) IBW/kg (Calculated) : 68.4  Vital Signs: Temp: 99.6 F (37.6 C) (11/20 0400) Temp Source: Oral (11/20 0400) BP: 151/100 mmHg (11/20 0400) Pulse Rate: 74 (11/20 0400) Intake/Output from previous day: 11/19 0701 - 11/20 0700 In: 2490 [I.V.:230; NG/GT:1960; IV Piggyback:300] Out: 3350 [Urine:3350] Intake/Output from this shift:    Labs:  Recent Labs  08/25/15 0856 08/26/15 0341 08/27/15 0350 08/28/15 0453  WBC 28.9* 30.6*  --   --   HGB 10.8* 9.4*  --   --   PLT 797* 759*  --   --   CREATININE 1.63* 1.34* 1.18 1.29*   Estimated Creatinine Clearance: 62.6 mL/min (by C-G formula based  on Cr of 1.29). No results for input(s): VANCOTROUGH, VANCOPEAK, VANCORANDOM, GENTTROUGH, GENTPEAK, GENTRANDOM, TOBRATROUGH, TOBRAPEAK, TOBRARND, AMIKACINPEAK, AMIKACINTROU, AMIKACIN in the last 72 hours.   Microbiology: Recent Results (from the past 720 hour(s))  Culture, Urine     Status: None   Collection Time: 07/29/15  6:06 PM  Result Value Ref Range Status   Specimen Description URINE, SUPRAPUBIC  Final   Special Requests Normal  Final   Culture   Final    >=100,000 COLONIES/mL PSEUDOMONAS AERUGINOSA AMIKACIN MIC=8 SENSITIVE FOSFOMYCIN MIC=>1024 RESISTANT AVYCAZ=22 SENSITIVE ZERBAXA=1 SENSITIVE    Report Status 08/17/2015 FINAL  Final   Organism ID, Bacteria PSEUDOMONAS AERUGINOSA  Final      Susceptibility   Pseudomonas aeruginosa - MIC*    CEFTAZIDIME 16 INTERMEDIATE Intermediate     CIPROFLOXACIN >=4 RESISTANT Resistant     GENTAMICIN >=16 RESISTANT Resistant     IMIPENEM >=16 RESISTANT Resistant     CEFEPIME 16 INTERMEDIATE Intermediate     * >=100,000 COLONIES/mL PSEUDOMONAS AERUGINOSA  Anaerobic culture     Status: None   Collection Time: 08/04/15  7:08 PM  Result Value Ref Range Status   Specimen Description TISSUE LEFT SACRAL  Final   Special Requests NONE  Final   Gram Stain   Final    NO WBC SEEN NO ORGANISMS SEEN Performed at [facility]    Culture   Final    NO ANAEROBES ISOLATED Performed at [facility]    Report Status 08/09/2015 FINAL  Final  Fungus Culture with Smear     Status: None   Collection Time: 08/04/15  7:11 PM  Result Value Ref Range Status   Specimen Description TISSUE LEFT SACRAL  Final   Special Requests NONE  Final   Fungal Smear   Final    NO YEAST OR FUNGAL ELEMENTS SEEN Performed at [facility]    Culture   Final    CANDIDA ALBICANS Performed at [facility]    Report Status 08/27/2015 FINAL  Final  AFB culture with smear     Status: None (Preliminary result)   Collection Time: 08/04/15   7:13 PM  Result Value Ref Range Status   Specimen Description TISSUE LEFT SACRAL  Final   Special Requests NONE  Final   Acid Fast Smear   Final    NO ACID FAST BACILLI SEEN Performed at [facility]    Culture   Final    CULTURE WILL BE EXAMINED FOR 6 WEEKS BEFORE ISSUING A FINAL REPORT Performed at [facility]    Report Status PENDING  Incomplete  Tissue culture     Status: None   Collection Time: 08/04/15  7:18 PM  Result Value Ref Range Status   Specimen Description TISSUE LEFT SACRAL  Final   Special Requests NONE  Final   Gram Stain   Final    NO WBC SEEN NO ORGANISMS SEEN Performed at [facility]    Culture   Final    MODERATE ESCHERICHIA COLI Performed at [facility]    Report Status 08/15/2015 FINAL  Final   Organism ID, Bacteria ESCHERICHIA COLI  Final      Susceptibility   Escherichia coli - MIC*    AMPICILLIN >=32 RESISTANT Resistant     AMPICILLIN/SULBACTAM >=32 RESISTANT Resistant     CEFAZOLIN 8 RESISTANT Resistant     CEFEPIME <=1 SENSITIVE Sensitive     CEFTAZIDIME <=1 SENSITIVE Sensitive     CEFTRIAXONE <=1 SENSITIVE Sensitive     CIPROFLOXACIN <=0.25 SENSITIVE Sensitive     GENTAMICIN <=1 SENSITIVE Sensitive     IMIPENEM <=0.25 SENSITIVE Sensitive     PIP/TAZO <=4 SENSITIVE Sensitive     TOBRAMYCIN <=1 SENSITIVE Sensitive     TRIMETH/SULFA Value in next row Sensitive      <=20 SENSITIVE(NOTE)    * MODERATE ESCHERICHIA COLI  Culture, Urine     Status: None   Collection Time: 08/10/15 10:59 AM  Result Value Ref Range Status   Specimen Description URINE, SUPRAPUBIC  Final   Special Requests NONE  Final   Culture MULTIPLE SPECIES PRESENT, SUGGEST RECOLLECTION  Final   Report Status 08/12/2015 FINAL  Final  Culture, blood (single)     Status: None   Collection Time: 08/13/15 11:45 AM  Result Value Ref Range Status   Specimen Description BLOOD LEFT HAND  Final   Special Requests BOTTLES DRAWN AEROBIC ONLY 5CC   Final   Culture NO GROWTH 5 DAYS  Final   Report Status 08/18/2015 FINAL  Final  C difficile quick scan w PCR reflex     Status: Abnormal   Collection Time: 08/13/15  9:54 PM  Result Value Ref Range Status   C Diff antigen POSITIVE (A) NEGATIVE Final   C Diff toxin NEGATIVE NEGATIVE Final   C Diff interpretation   Final    C. difficile present, but toxin not detected. This indicates colonization. In most cases, this does not require treatment. If patient has signs  and symptoms consistent with colitis, consider treatment. Requires ENTERIC precautions.  Blood Culture (routine x 2)     Status: None (Preliminary result)   Collection Time: 08/25/15  8:51 AM  Result Value Ref Range Status   Specimen Description BLOOD RIGHT ARM  Final   Special Requests BOTTLES DRAWN AEROBIC AND ANAEROBIC 5CC  Final   Culture NO GROWTH 2 DAYS  Final   Report Status PENDING  Incomplete  Blood Culture (routine x 2)     Status: None (Preliminary result)   Collection Time: 08/25/15  8:56 AM  Result Value Ref Range Status   Specimen Description BLOOD LEFT WRIST  Final   Special Requests BOTTLES DRAWN AEROBIC AND ANAEROBIC 5CC  Final   Culture NO GROWTH 2 DAYS  Final   Report Status PENDING  Incomplete  Urine culture     Status: None   Collection Time: 08/25/15 11:45 AM  Result Value Ref Range Status   Specimen Description URINE, CATHETERIZED  Final   Special Requests NONE  Final   Culture MULTIPLE SPECIES PRESENT, SUGGEST RECOLLECTION  Final   Report Status 08/26/2015 FINAL  Final  MRSA PCR Screening     Status: None   Collection Time: 08/25/15  2:23 PM  Result Value Ref Range Status   MRSA by PCR NEGATIVE NEGATIVE Final    Comment:        The GeneXpert MRSA Assay (FDA approved for NASAL specimens only), is one component of a comprehensive MRSA colonization surveillance program. It is not intended to diagnose MRSA infection nor to guide or monitor treatment for MRSA infections.   C difficile quick  scan w PCR reflex     Status: Abnormal   Collection Time: 08/25/15 11:20 PM  Result Value Ref Range Status   C Diff antigen POSITIVE (A) NEGATIVE Final   C Diff toxin NEGATIVE NEGATIVE Final   C Diff interpretation   Final    C. difficile present, but toxin not detected. This indicates colonization. In most cases, this does not require treatment. If patient has signs and symptoms consistent with colitis, consider treatment. Requires ENTERIC precautions.  Respiratory virus antigens panel     Status: None   Collection Time: 08/25/15 11:20 PM  Result Value Ref Range Status   Source - RVPAN NASOPHARYNGEAL  Corrected   Respiratory Syncytial Virus A Negative Negative Final   Respiratory Syncytial Virus B Negative Negative Final   Influenza A Negative Negative Final   Influenza B Negative Negative Final   Parainfluenza 1 Negative Negative Final   Parainfluenza 2 Negative Negative Final   Parainfluenza 3 Negative Negative Final   Metapneumovirus Negative Negative Final   Rhinovirus Negative Negative Final   Adenovirus Negative Negative Final    Comment: (NOTE) Performed At: [facility] [place], [place] [id number] Lindon Romp MD [id number]   Clostridium Difficile by PCR     Status: None   Collection Time: 08/25/15 11:20 PM  Result Value Ref Range Status   Toxigenic C Difficile by pcr NEGATIVE NEGATIVE Final   Assessment: 76 YOM quadriplegic with complicated PMH here with sepsis- Large decubitus ulcers, recent MRSA and micro-aerophilic streptococcal bacteremia, respiratory failure/tracheostomy complicated by MDRO UTI - ended up receiving colistin during admit last month, finished a couple days ago per MD note. Initiated on vancomycin and primaxin but currently continues on primaxin monotherapy. Primaxin dose on the low side since renal function is likely overestimated d/t quadriplegia. Scr remains elevated, pt with TMax of 100.9  and WBC of 30.6 as of 11/18.     11/17 imipenem>> 11/17 vanc>>11/18 Vanc PO 11/18>>11/19  11/17 BCx2>>NGTD 11/17 UC>>needs recollection 11/17 CDiff - NEG  Goal of Therapy:  Eradication of infection  Plan:  - Continue primaxin 250mg  IV Q8H - F/u renal fxn, C&S, clinical status   Salome Arnt, PharmD, BCPS Pager # [phone] 08/28/2015 7:36 AM

## 2015-08-28 NOTE — Progress Notes (Signed)
PATIENT DETAILS Name: Jason [name] Age: 29 y.o. Sex: male Date of Birth: 05/29/1986 Admit Date: 08/25/2015 Admitting Physician Costin Karlyne Greenspan, MD PCP:FRY,STEPHEN A, MD  Brief Narrative: Jason [name] is a 29 y.o. 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 Initial workup revealed possible HCAP on CXR-patient was started on Vancomycin and Imipenem and admitted to the hospital for further evaluation and treatment   Subjective: Although much improved-febrile last night.  Assessment/Plan: Principal Problem: Sepsis secondary to HCAP: Although likely secondary to HCAP-blood cultures negative so far, urine culture shows multiple bacterial morphotypes.Influenza PCR/Resp virus panel neg.  Urine Strep Pneumonia Ag negative. Infectious disease following, antibiotics now narrative just to Primaxin. Follow clinical course.   Active Problems: Acute encephalopathy: Resolved, likely secondary to above.  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 Supportive care.  Acute renal failure: Likely secondary to prerenal azotemia-better with IV fluids.   Dysphagia: seen by speech therapy-started on a regular diet.   Hyponatremia: resolved with IV fluids. likely secondary to dehydration   History of gastroparesis: Likely secondary to diabetes, no vomiting-seem stable at present. Follow, if needed we will start Reglan.  Type 1 diabetes: CBGs stable-continue with Lantus 10  units and SSI. Follow and adjust accordingly  History of neurogenic bladder: Supra pubic catheter in place.  Severe protein calorie malnutrition: On nocturnal tube feedings, nutrition consulted.  Stage IV sacral ulcers/stage III left heel ulcer/Full thickness wound mid back:present prior to admission, seen by wound care RN-not felt to be the source of sepsis  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. IVC filter placed 8/6. Not on A/C due to need for frequent debridements which have caused extensive bleeding, previously on apixaban-on prophylactic Lovenox  Chronic anemia, likely due to chronic disease: Hemoglobin stable.  History of spinal cord infarct with paraplegia, partial quadriplegia  Disposition: Remain inpatient-remain in step down for 1-2 more days-complicated/requires a lot of RN care.   Antimicrobial agents  See below  Anti-infectives    Start     Dose/Rate Route Frequency Ordered Stop   08/26/15 1400  vancomycin (VANCOCIN) 50 mg/mL oral solution 125 mg  Status:  Discontinued     125 mg Oral 4 times daily 08/26/15 1157 08/27/15 1047   08/25/15 1000  vancomycin (VANCOCIN) 500 mg in sodium chloride 0.9 % 100 mL IVPB     500 mg 100 mL/hr over 60 Minutes Intravenous  Once 08/25/15 0943 08/25/15 1151   08/25/15 0930  vancomycin (VANCOCIN) 500 mg in sodium chloride 0.9 % 100 mL IVPB  Status:  Discontinued     500 mg 100 mL/hr over 60 Minutes Intravenous Every 24 hours 08/25/15 0921 08/26/15 1157   08/25/15 0930  imipenem-cilastatin (PRIMAXIN) 250 mg in sodium chloride 0.9 % 100 mL IVPB     250 mg 200 mL/hr over 30 Minutes Intravenous 3 times per day 08/25/15 [id number]        DVT Prophylaxis: Prophylactic Lovenox   Code Status: Full code  Family Communication Mother  at bedside  Procedures: None  CONSULTS:  ID  Time spent 30 minutes-Greater than 50% of this time was spent in counseling, explanation of diagnosis, planning of further management, and coordination of  care.  MEDICATIONS: Scheduled Meds: . antiseptic oral rinse  7 mL Mouth Rinse q12n4p  . chlorhexidine  15 mL Mouth Rinse BID  . collagenase  1 application Topical Q M,W,F  . enoxaparin (LOVENOX) injection  40 mg Subcutaneous Q24H  . feeding supplement (GLUCERNA 1.2 CAL)  1,000 mL Per Tube Q24H  . fentaNYL  50 mcg Transdermal Q72H  . free water  200 mL Per Tube 3 times per day  . imipenem-cilastatin  250 mg Intravenous 3 times per day  . insulin aspart  0-20 Units Subcutaneous 6 times per day  . insulin glargine  10 Units Subcutaneous Daily  . magnesium oxide  400 mg Oral BID  . midodrine  20 mg Per Tube TID WC  . multivitamin with minerals  1 tablet Per Tube Daily  . pantoprazole sodium  40 mg Per Tube Daily  . pregabalin  100 mg Per Tube TID AC  . pregabalin  200 mg Oral QHS  . saccharomyces boulardii  250 mg Oral BID  . sodium chloride  10-40 mL Intracatheter Q12H   Continuous Infusions: . sodium chloride 40 mL/hr at 08/28/15 0905   PRN Meds:.albuterol, LORazepam, mirtazapine, ondansetron, oxyCODONE, promethazine, sodium chloride    PHYSICAL EXAM: Vital signs in last 24 hours: Filed Vitals:   08/28/15 0345 08/28/15 0400 08/28/15 0441 08/28/15 0800  BP:  151/100  113/75  Pulse: 75 74  71  Temp:  99.6 F (37.6 C)  98.4 F (36.9 C)  TempSrc:  Oral  Oral  Resp: 20 19  18   Height:      Weight:   53.3 kg (117 lb 8.1 oz)   SpO2: 98% 98%  97%    Weight change: 0 kg (0 lb) Filed Weights   08/25/15 1344 08/27/15 0500 08/28/15 0441  Weight: 50.122 kg (110 lb 8 oz) 53.3 kg (117 lb 8.1 oz) 53.3 kg (117 lb 8.1 oz)   Body mass index is 17.87 kg/(m^2).   Gen Exam: Awake and alert with clear speech.  Neck:tracheostomy in place est: B/L Clear.  CVS: S1 S2 Regular, no murmurs.  Abdomen: soft, BS +, non tender, non distended. left lower quadrant colostomy  Neurologic: Quadriparesis Skin: No Rash.   Wounds: see above  Intake/Output from previous day:  Intake/Output Summary  (Last 24 hours) at 08/28/15 0943 Last data filed at 08/28/15 0800  Gross per 24 hour  Intake   2470 ml  Output   4150 ml  Net  -1680 ml     LAB RESULTS: CBC  Recent Labs Lab 08/25/15 0856 08/26/15 0341  WBC 28.9* 30.6*  HGB 10.8* 9.4*  HCT 34.7* 31.1*  PLT 797* 759*  MCV 86.1 86.9  MCH 26.8 26.3  MCHC 31.1 30.2  RDW 14.2 14.2  LYMPHSABS 2.3 3.1  MONOABS 2.6* 2.4*  EOSABS 0.6 2.1*  BASOSABS 0.0 0.0    Chemistries   Recent Labs Lab 08/25/15 0856 08/26/15 0341 08/27/15 0350 08/28/15 0453  NA 127* 132* 136 130*  K 5.0 4.3 3.9 4.3  CL 96* 105 107 98*  CO2 24 23 24 26   GLUCOSE 383* 154* 120* 116*  BUN 34* 31* 22* 24*  CREATININE 1.63* 1.34* 1.18 1.29*  CALCIUM 14.8* 13.0* 12.9* 12.5*    CBG:  Recent Labs Lab 08/27/15 1621  08/27/15 2020 08/28/15 0018 08/28/15 0432 08/28/15 0802  GLUCAP 83 105* 167* 122* 103*    GFR Estimated Creatinine Clearance: 62.6 mL/min (by C-G formula based on Cr of 1.29).  Coagulation profile No results for input(s): INR, PROTIME in the last 168 hours.  Cardiac Enzymes No results for input(s): CKMB, TROPONINI, MYOGLOBIN in the last 168 hours.  Invalid input(s): CK  Invalid input(s): POCBNP No results for input(s): DDIMER in the last 72 hours. No results for input(s): HGBA1C in the last 72 hours. No results for input(s): CHOL, HDL, LDLCALC, TRIG, CHOLHDL, LDLDIRECT in the last 72 hours. No results for input(s): TSH, T4TOTAL, T3FREE, THYROIDAB in the last 72 hours.  Invalid input(s): FREET3 No results for input(s): VITAMINB12, FOLATE, FERRITIN, TIBC, IRON, RETICCTPCT in the last 72 hours. No results for input(s): LIPASE, AMYLASE in the last 72 hours.  Urine Studies No results for input(s): UHGB, CRYS in the last 72 hours.  Invalid input(s): UACOL, UAPR, USPG, UPH, UTP, UGL, UKET, UBIL, UNIT, UROB, ULEU, UEPI, UWBC, URBC, UBAC, CAST, UCOM, BILUA  MICROBIOLOGY: Recent Results (from the past 240 hour(s))  Blood  Culture (routine x 2)     Status: None (Preliminary result)   Collection Time: 08/25/15  8:51 AM  Result Value Ref Range Status   Specimen Description BLOOD RIGHT ARM  Final   Special Requests BOTTLES DRAWN AEROBIC AND ANAEROBIC 5CC  Final   Culture NO GROWTH 2 DAYS  Final   Report Status PENDING  Incomplete  Blood Culture (routine x 2)     Status: None (Preliminary result)   Collection Time: 08/25/15  8:56 AM  Result Value Ref Range Status   Specimen Description BLOOD LEFT WRIST  Final   Special Requests BOTTLES DRAWN AEROBIC AND ANAEROBIC 5CC  Final   Culture NO GROWTH 2 DAYS  Final   Report Status PENDING  Incomplete  Urine culture     Status: None   Collection Time: 08/25/15 11:45 AM  Result Value Ref Range Status   Specimen Description URINE, CATHETERIZED  Final   Special Requests NONE  Final   Culture MULTIPLE SPECIES PRESENT, SUGGEST RECOLLECTION  Final   Report Status 08/26/2015 FINAL  Final  MRSA PCR Screening     Status: None   Collection Time: 08/25/15  2:23 PM  Result Value Ref Range Status   MRSA by PCR NEGATIVE NEGATIVE Final    Comment:        The GeneXpert MRSA Assay (FDA approved for NASAL specimens only), is one component of a comprehensive MRSA colonization surveillance program. It is not intended to diagnose MRSA infection nor to guide or monitor treatment for MRSA infections.   C difficile quick scan w PCR reflex     Status: Abnormal   Collection Time: 08/25/15 11:20 PM  Result Value Ref Range Status   C Diff antigen POSITIVE (A) NEGATIVE Final   C Diff toxin NEGATIVE NEGATIVE Final   C Diff interpretation   Final    C. difficile present, but toxin not detected. This indicates colonization. In most cases, this does not require treatment. If patient has signs and symptoms consistent with colitis, consider treatment. Requires ENTERIC precautions.  Respiratory virus antigens panel     Status: None   Collection Time: 08/25/15 11:20 PM  Result Value Ref  Range Status   Source - RVPAN NASOPHARYNGEAL  Corrected   Respiratory Syncytial Virus A Negative Negative Final   Respiratory Syncytial Virus B Negative Negative Final   Influenza A  Negative Negative Final   Influenza B Negative Negative Final   Parainfluenza 1 Negative Negative Final   Parainfluenza 2 Negative Negative Final   Parainfluenza 3 Negative Negative Final   Metapneumovirus Negative Negative Final   Rhinovirus Negative Negative Final   Adenovirus Negative Negative Final    Comment: (NOTE) Performed At: [facility] [address] [place], [place] [id number] Lindon Romp MD [id number]   Clostridium Difficile by PCR     Status: None   Collection Time: 08/25/15 11:20 PM  Result Value Ref Range Status   Toxigenic C Difficile by pcr NEGATIVE NEGATIVE Final    RADIOLOGY STUDIES/RESULTS: Dg Chest Port 1 View  08/26/2015  CLINICAL DATA:  Status post right PICC placement today. EXAM: PORTABLE CHEST 1 VIEW COMPARISON:  Single view of the chest 08/25/2015. FINDINGS: Right PICC is in place with the tip projecting over the lower superior vena cava. Tracheostomy tube is again seen. Airspace disease in the right mid and lower lung zones persists. Aeration in the right base appears slightly improved. The left lung is clear. Heart size is normal. No pneumothorax. IMPRESSION: Tip of right PICC projects over the lower superior vena cava. Persistent airspace disease in the right shows mild improvement in the lung base. Electronically Signed   By: Inge Rise M.D.   On: 08/26/2015 12:45   Dg Chest Port 1 View  08/25/2015  CLINICAL DATA:  Fever. EXAM: PORTABLE CHEST 1 VIEW COMPARISON:  July 27, 2015. FINDINGS: Tracheostomy is in grossly good position. Cardiomediastinal silhouette appears normal. No pneumothorax or significant pleural effusion is noted. Left lung is clear. Right midlung and basilar opacity is noted concerning for pneumonia. Bony thorax is unremarkable.  IMPRESSION: Right-sided pneumonia is noted.  Tracheostomy is in good position. Electronically Signed   By: Marijo Conception, M.D.   On: 08/25/2015 09:36    Oren Binet, MD  [facility] Pager:[phone]  If 7PM-7AM, please contact night-coverage [email or web address] Password [email or web address] 08/28/2015, 9:43 AM   LOS: 3 days

## 2015-08-29 ENCOUNTER — Other Ambulatory Visit: Payer: Self-pay | Admitting: Family Medicine

## 2015-08-29 LAB — BASIC METABOLIC PANEL
ANION GAP: 4 — AB (ref 5–15)
BUN: 27 mg/dL — AB (ref 6–20)
CHLORIDE: 95 mmol/L — AB (ref 101–111)
CO2: 28 mmol/L (ref 22–32)
Calcium: 12 mg/dL — ABNORMAL HIGH (ref 8.9–10.3)
Creatinine, Ser: 1.33 mg/dL — ABNORMAL HIGH (ref 0.61–1.24)
GFR calc Af Amer: >60 mL/min (ref >60)
GLUCOSE: 191 mg/dL — AB (ref 65–99)
POTASSIUM: 5 mmol/L (ref 3.5–5.1)
Sodium: 127 mmol/L — ABNORMAL LOW (ref 135–145)

## 2015-08-29 LAB — LEGIONELLA PNEUMOPHILA SEROGP 1 UR AG: L. PNEUMOPHILA SEROGP 1 UR AG: NEGATIVE

## 2015-08-29 LAB — CBC
HCT: 30.3 % — ABNORMAL LOW (ref 39.0–52.0)
HEMOGLOBIN: 9.6 g/dL — AB (ref 13.0–17.0)
MCH: 26.7 pg (ref 26.0–34.0)
MCHC: 31.7 g/dL (ref 30.0–36.0)
MCV: 84.2 fL (ref 78.0–100.0)
PLATELETS: 667 10*3/uL — AB (ref 150–400)
RBC: 3.6 MIL/uL — AB (ref 4.22–5.81)
RDW: 14.2 % (ref 11.5–15.5)
WBC: 24.6 10*3/uL — ABNORMAL HIGH (ref 4.0–10.5)

## 2015-08-29 LAB — GLUCOSE, CAPILLARY
GLUCOSE-CAPILLARY: 212 mg/dL — AB (ref 65–99)
GLUCOSE-CAPILLARY: 174 mg/dL — AB (ref 65–99)
Glucose-Capillary: 111 mg/dL — ABNORMAL HIGH (ref 65–99)
Glucose-Capillary: 138 mg/dL — ABNORMAL HIGH (ref 65–99)
Glucose-Capillary: 154 mg/dL — ABNORMAL HIGH (ref 65–99)
Glucose-Capillary: 188 mg/dL — ABNORMAL HIGH (ref 65–99)

## 2015-08-29 NOTE — Consult Note (Addendum)
WOC wound follow up 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 Stage 3 pressure injuries left heel x 4 Full thickness wound mid back Pressure Ulcer POA: Yes x 6 Measurement: L foot/dorsal: 1.0cm x 0.4cm x 0.1cm  L foot lateral: 1.5cm x 1.5cm x 0.1cm  Left heel two open areas however these were one wound previously, skin bridge now intact between 3.5cm x 1.5cm x 0.1cm and 1.0cm x 1.0 x 0.1cm  Left ischium: 8cm x 7cm x 1.5cm with 2.5cm undermining from 4-12 o'clock Right ischium: 5.5cm x 7.0cm x 2cm  Sacrum:9.0cm x 11cm x 3.0cm Midback: 10cm x 10cm with some re epithelization noted throughout the wound  Wound bed:  All the wounds are clean now, pink, non granular Drainage (amount, consistency, odor) minimal from the heel/foot wounds.  Sacrum/bilateral ischial wounds heavy, serosanguinous drainage, slight odor Periwound:intact  Dressing procedure/placement/frequency:  1. Silicone foam to the left heel and left dorsal foot wound, change every 3 days and PRN soilage.  2. Cover back wounds with xeroform gauze, top with dry gauze. Change M/W/F. 3. Prevalon boots at all times 4. NPWT VAC applied to the sacral and bilateral ischial wounds, used strip of hydrocolloid to aid in seal of dressing. Placed between anus and distal edge of sacral wound.   Ostomy pouch intact, mother independent with care.  Supplies in the room.    [place] team will follow along with you for continued support with wound and ostomy needs.  Caitlynne Harbeck [place] RN,CWOCN [id number]

## 2015-08-29 NOTE — Progress Notes (Signed)
Utilization Review Completed.  

## 2015-08-29 NOTE — Progress Notes (Signed)
PATIENT DETAILS Name: Jason [name] Age: 29 y.o. Sex: male Date of Birth: 01-May-1986 Admit Date: 08/25/2015 Admitting Physician Costin Karlyne Greenspan, MD PCP:FRY,STEPHEN A, MD  Brief Narrative: Jason [name] is a 29 y.o. 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 Initial workup revealed possible HCAP on CXR-patient was started on Vancomycin and Imipenem and admitted to the hospital for further evaluation and treatment   Subjective: Febrile last night-wanting to go home.  Assessment/Plan: Principal Problem: Sepsis secondary to HCAP: likely secondary to HCAP-blood cultures negative so far, urine culture shows multiple bacterial morphotypes.Influenza PCR/Resp virus panel neg.  Urine Strep Pneumonia Ag negative. Infectious disease following, antibiotics now narrative just to Primaxin.Much improved than on initial presentation-we will likely narrowed down antibiotics to either doxycycline or Bactrim depending on renal function on discharge. Follow clinical course.   Active Problems: Acute encephalopathy: Resolved, likely secondary to above.  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 Supportive care.  Acute renal failure: Likely secondary to prerenal azotemia-initially resolved with IV fluids-but now starting to reoccur-it seems that the patient had significant urine output of 3.9 L yesterday-hence would increase IV fluids to 75 mL and follow.  Hyponatremia:?  Secondary to dehydration-increase IV fluids and follow.  Dysphagia: seen by speech therapy-started on a regular diet.   History of gastroparesis: Likely secondary to diabetes, no vomiting-seem stable at present. Follow, if needed we will start Reglan.  Type 1 diabetes: CBGs stable-continue with Lantus 10 units and SSI. Follow and adjust accordingly  History of neurogenic bladder: Supra pubic catheter in place.  Severe protein calorie malnutrition: On nocturnal tube feedings, nutrition consulted.  Stage IV sacral ulcers/stage III left heel ulcer/Full thickness wound mid back:present prior to admission, seen by wound care RN-not felt to be the source of sepsis  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. IVC filter placed 8/6. Not on A/C due to need for frequent debridements which have caused extensive bleeding, previously on apixaban-on prophylactic Lovenox  Chronic anemia, likely due to chronic disease: Hemoglobin stable.  History of spinal cord infarct with paraplegia, partial quadriplegia  Disposition: Remain inpatient-remain in step down-home tomorrow if lytes remain stable   Antimicrobial agents  See below  Anti-infectives    Start     Dose/Rate Route Frequency Ordered Stop   08/26/15 1400  vancomycin (VANCOCIN) 50 mg/mL oral solution 125 mg  Status:  Discontinued     125 mg Oral 4 times daily 08/26/15 1157 08/27/15 1047   08/25/15 1000  vancomycin (VANCOCIN) 500 mg in sodium chloride 0.9 % 100 mL IVPB     500 mg 100 mL/hr over 60 Minutes Intravenous  Once 08/25/15 0943 08/25/15 1151   08/25/15 0930  vancomycin (VANCOCIN) 500 mg in sodium chloride 0.9 % 100 mL IVPB  Status:  Discontinued     500 mg 100 mL/hr over 60 Minutes Intravenous Every 24 hours 08/25/15 0921 08/26/15 1157   08/25/15 0930  imipenem-cilastatin (PRIMAXIN) 250 mg in sodium chloride 0.9 % 100 mL  IVPB     250 mg 200 mL/hr over 30 Minutes Intravenous 3 times per day 08/25/15 [id number]        DVT Prophylaxis: Prophylactic  Lovenox   Code Status: Full code  Family Communication Mother at bedside  Procedures: None  CONSULTS:  ID  Time spent 30 minutes-Greater than 50% of this time was spent in counseling, explanation of diagnosis, planning of further management, and coordination of care.  MEDICATIONS: Scheduled Meds: . antiseptic oral rinse  7 mL Mouth Rinse q12n4p  . chlorhexidine  15 mL Mouth Rinse BID  . collagenase  1 application Topical Q M,W,F  . enoxaparin (LOVENOX) injection  40 mg Subcutaneous Q24H  . feeding supplement (GLUCERNA 1.2 CAL)  1,000 mL Per Tube Q24H  . fentaNYL  50 mcg Transdermal Q72H  . free water  200 mL Per Tube 3 times per day  . imipenem-cilastatin  250 mg Intravenous 3 times per day  . insulin aspart  0-20 Units Subcutaneous 6 times per day  . insulin glargine  10 Units Subcutaneous Daily  . magnesium oxide  400 mg Oral BID  . midodrine  20 mg Per Tube TID WC  . multivitamin with minerals  1 tablet Per Tube Daily  . pantoprazole sodium  40 mg Per Tube Daily  . pregabalin  100 mg Per Tube TID AC  . pregabalin  200 mg Oral QHS  . saccharomyces boulardii  250 mg Oral BID  . sodium chloride  10-40 mL Intracatheter Q12H   Continuous Infusions: . sodium chloride 40 mL/hr at 08/28/15 2107   PRN Meds:.albuterol, LORazepam, mirtazapine, ondansetron, oxyCODONE, promethazine, sodium chloride    PHYSICAL EXAM: Vital signs in last 24 hours: Filed Vitals:   08/29/15 0412 08/29/15 0431 08/29/15 0728 08/29/15 0806  BP: 118/79  102/72   Pulse: 73  73 73  Temp: 98 F (36.7 C)  98.9 F (37.2 C)   TempSrc: Oral  Oral   Resp: 13  13 15   Height:      Weight:  49.6 kg (109 lb 5.6 oz)    SpO2: 97%  99% 97%    Weight change: -3.7 kg (-8 lb 2.5 oz) Filed Weights   08/27/15 0500 08/28/15 0441 08/29/15 0431  Weight: 53.3 kg (117 lb 8.1 oz) 53.3 kg (117 lb 8.1 oz) 49.6 kg (109 lb 5.6 oz)   Body mass index is 16.63 kg/(m^2).   Gen Exam: Awake and alert with clear speech.   Neck:tracheostomy in place est: B/L Clear.  CVS: S1 S2 Regular, no murmurs.  Abdomen: soft, BS +, non tender, non distended. left lower quadrant colostomy  Neurologic: Quadriparesis Skin: No Rash.   Wounds: see above  Intake/Output from previous day:  Intake/Output Summary (Last 24 hours) at 08/29/15 1035 Last data filed at 08/29/15 0629  Gross per 24 hour  Intake 2042.5 ml  Output   3400 ml  Net -1357.5 ml     LAB RESULTS: CBC  Recent Labs Lab 08/25/15 0856 08/26/15 0341 08/28/15 1027 08/29/15 0439  WBC 28.9* 30.6* 23.6* 24.6*  HGB 10.8* 9.4* 9.5* 9.6*  HCT 34.7* 31.1* 30.7* 30.3*  PLT 797* 759* 681* 667*  MCV 86.1 86.9 84.6 84.2  MCH 26.8 26.3 26.2 26.7  MCHC 31.1 30.2 30.9 31.7  RDW 14.2 14.2 14.1 14.2  LYMPHSABS 2.3 3.1  --   --   MONOABS 2.6* 2.4*  --   --   EOSABS 0.6 2.1*  --   --   BASOSABS  0.0 0.0  --   --     Chemistries   Recent Labs Lab 08/25/15 0856 08/26/15 0341 08/27/15 0350 08/28/15 0453 08/29/15 0439  NA 127* 132* 136 130* 127*  K 5.0 4.3 3.9 4.3 5.0  CL 96* 105 107 98* 95*  CO2 24 23 24 26 28   GLUCOSE 383* 154* 120* 116* 191*  BUN 34* 31* 22* 24* 27*  CREATININE 1.63* 1.34* 1.18 1.29* 1.33*  CALCIUM 14.8* 13.0* 12.9* 12.5* 12.0*    CBG:  Recent Labs Lab 08/28/15 1616 08/28/15 2044 08/28/15 2354 08/29/15 0411 08/29/15 0725  GLUCAP 77 153* 111* 188* 138*    GFR Estimated Creatinine Clearance: 56.5 mL/min (by C-G formula based on Cr of 1.33).  Coagulation profile No results for input(s): INR, PROTIME in the last 168 hours.  Cardiac Enzymes No results for input(s): CKMB, TROPONINI, MYOGLOBIN in the last 168 hours.  Invalid input(s): CK  Invalid input(s): POCBNP No results for input(s): DDIMER in the last 72 hours. No results for input(s): HGBA1C in the last 72 hours. No results for input(s): CHOL, HDL, LDLCALC, TRIG, CHOLHDL, LDLDIRECT in the last 72 hours. No results for input(s): TSH, T4TOTAL, T3FREE, THYROIDAB  in the last 72 hours.  Invalid input(s): FREET3 No results for input(s): VITAMINB12, FOLATE, FERRITIN, TIBC, IRON, RETICCTPCT in the last 72 hours. No results for input(s): LIPASE, AMYLASE in the last 72 hours.  Urine Studies No results for input(s): UHGB, CRYS in the last 72 hours.  Invalid input(s): UACOL, UAPR, USPG, UPH, UTP, UGL, UKET, UBIL, UNIT, UROB, ULEU, UEPI, UWBC, URBC, UBAC, CAST, UCOM, BILUA  MICROBIOLOGY: Recent Results (from the past 240 hour(s))  Blood Culture (routine x 2)     Status: None (Preliminary result)   Collection Time: 08/25/15  8:51 AM  Result Value Ref Range Status   Specimen Description BLOOD RIGHT ARM  Final   Special Requests BOTTLES DRAWN AEROBIC AND ANAEROBIC 5CC  Final   Culture NO GROWTH 3 DAYS  Final   Report Status PENDING  Incomplete  Blood Culture (routine x 2)     Status: None (Preliminary result)   Collection Time: 08/25/15  8:56 AM  Result Value Ref Range Status   Specimen Description BLOOD LEFT WRIST  Final   Special Requests BOTTLES DRAWN AEROBIC AND ANAEROBIC 5CC  Final   Culture NO GROWTH 3 DAYS  Final   Report Status PENDING  Incomplete  Urine culture     Status: None   Collection Time: 08/25/15 11:45 AM  Result Value Ref Range Status   Specimen Description URINE, CATHETERIZED  Final   Special Requests NONE  Final   Culture MULTIPLE SPECIES PRESENT, SUGGEST RECOLLECTION  Final   Report Status 08/26/2015 FINAL  Final  MRSA PCR Screening     Status: None   Collection Time: 08/25/15  2:23 PM  Result Value Ref Range Status   MRSA by PCR NEGATIVE NEGATIVE Final    Comment:        The GeneXpert MRSA Assay (FDA approved for NASAL specimens only), is one component of a comprehensive MRSA colonization surveillance program. It is not intended to diagnose MRSA infection nor to guide or monitor treatment for MRSA infections.   C difficile quick scan w PCR reflex     Status: Abnormal   Collection Time: 08/25/15 11:20 PM  Result  Value Ref Range Status   C Diff antigen POSITIVE (A) NEGATIVE Final   C Diff toxin NEGATIVE NEGATIVE Final   C Diff  interpretation   Final    C. difficile present, but toxin not detected. This indicates colonization. In most cases, this does not require treatment. If patient has signs and symptoms consistent with colitis, consider treatment. Requires ENTERIC precautions.  Respiratory virus antigens panel     Status: None   Collection Time: 08/25/15 11:20 PM  Result Value Ref Range Status   Source - RVPAN NASOPHARYNGEAL  Corrected   Respiratory Syncytial Virus A Negative Negative Final   Respiratory Syncytial Virus B Negative Negative Final   Influenza A Negative Negative Final   Influenza B Negative Negative Final   Parainfluenza 1 Negative Negative Final   Parainfluenza 2 Negative Negative Final   Parainfluenza 3 Negative Negative Final   Metapneumovirus Negative Negative Final   Rhinovirus Negative Negative Final   Adenovirus Negative Negative Final    Comment: (NOTE) Performed At: [facility] [place], [place] [id number] Lindon Romp MD [id number]   Clostridium Difficile by PCR     Status: None   Collection Time: 08/25/15 11:20 PM  Result Value Ref Range Status   Toxigenic C Difficile by pcr NEGATIVE NEGATIVE Final    RADIOLOGY STUDIES/RESULTS: Dg Chest Port 1 View  08/26/2015  CLINICAL DATA:  Status post right PICC placement today. EXAM: PORTABLE CHEST 1 VIEW COMPARISON:  Single view of the chest 08/25/2015. FINDINGS: Right PICC is in place with the tip projecting over the lower superior vena cava. Tracheostomy tube is again seen. Airspace disease in the right mid and lower lung zones persists. Aeration in the right base appears slightly improved. The left lung is clear. Heart size is normal. No pneumothorax. IMPRESSION: Tip of right PICC projects over the lower superior vena cava. Persistent airspace disease in the right shows mild improvement in  the lung base. Electronically Signed   By: Inge Rise M.D.   On: 08/26/2015 12:45   Dg Chest Port 1 View  08/25/2015  CLINICAL DATA:  Fever. EXAM: PORTABLE CHEST 1 VIEW COMPARISON:  July 27, 2015. FINDINGS: Tracheostomy is in grossly good position. Cardiomediastinal silhouette appears normal. No pneumothorax or significant pleural effusion is noted. Left lung is clear. Right midlung and basilar opacity is noted concerning for pneumonia. Bony thorax is unremarkable. IMPRESSION: Right-sided pneumonia is noted.  Tracheostomy is in good position. Electronically Signed   By: Marijo Conception, M.D.   On: 08/25/2015 09:36    Oren Binet, MD  [facility] Pager:[phone]  If 7PM-7AM, please contact night-coverage [email or web address] Password [email or web address] 08/29/2015, 10:35 AM   LOS: 4 days

## 2015-08-30 DIAGNOSIS — D721 Eosinophilia, unspecified: Secondary | ICD-10-CM | POA: Insufficient documentation

## 2015-08-30 LAB — BASIC METABOLIC PANEL WITH GFR
Anion gap: 5 (ref 5–15)
BUN: 26 mg/dL — ABNORMAL HIGH (ref 6–20)
CO2: 28 mmol/L (ref 22–32)
Calcium: 11.8 mg/dL — ABNORMAL HIGH (ref 8.9–10.3)
Chloride: 95 mmol/L — ABNORMAL LOW (ref 101–111)
Creatinine, Ser: 1.44 mg/dL — ABNORMAL HIGH (ref 0.61–1.24)
GFR calc Af Amer: >60 mL/min
GFR calc non Af Amer: >60 mL/min
Glucose, Bld: 172 mg/dL — ABNORMAL HIGH (ref 65–99)
Potassium: 4.8 mmol/L (ref 3.5–5.1)
Sodium: 128 mmol/L — ABNORMAL LOW (ref 135–145)

## 2015-08-30 LAB — GLUCOSE, CAPILLARY
GLUCOSE-CAPILLARY: 151 mg/dL — AB (ref 65–99)
Glucose-Capillary: 141 mg/dL — ABNORMAL HIGH (ref 65–99)
Glucose-Capillary: 149 mg/dL — ABNORMAL HIGH (ref 65–99)
Glucose-Capillary: 170 mg/dL — ABNORMAL HIGH (ref 65–99)
Glucose-Capillary: 193 mg/dL — ABNORMAL HIGH (ref 65–99)
Glucose-Capillary: 221 mg/dL — ABNORMAL HIGH (ref 65–99)
Glucose-Capillary: 279 mg/dL — ABNORMAL HIGH (ref 65–99)

## 2015-08-30 LAB — CULTURE, BLOOD (ROUTINE X 2)
Culture: NO GROWTH
Culture: NO GROWTH

## 2015-08-30 LAB — CBC
HEMATOCRIT: 29.6 % — AB (ref 39.0–52.0)
Hemoglobin: 9.2 g/dL — ABNORMAL LOW (ref 13.0–17.0)
MCH: 26 pg (ref 26.0–34.0)
MCHC: 31.1 g/dL (ref 30.0–36.0)
MCV: 83.6 fL (ref 78.0–100.0)
PLATELETS: 604 10*3/uL — AB (ref 150–400)
RBC: 3.54 MIL/uL — ABNORMAL LOW (ref 4.22–5.81)
RDW: 14.2 % (ref 11.5–15.5)
WBC: 26.2 10*3/uL — ABNORMAL HIGH (ref 4.0–10.5)

## 2015-08-30 MED ORDER — BACLOFEN 10 MG PO TABS: 10.0000 mg | ORAL_TABLET | Freq: Two times a day (BID) | ORAL | Status: DC

## 2015-08-30 MED ORDER — AZTREONAM 1 G IJ SOLR
1.0000 g | Freq: Three times a day (TID) | INTRAMUSCULAR | Status: DC
  Administered 2015-08-30 – 2015-09-01 (×6): 1 g via INTRAVENOUS
  Filled 2015-08-30 (×8): qty 1

## 2015-08-30 MED ORDER — BACLOFEN 10 MG PO TABS
10.0000 mg | ORAL_TABLET | Freq: Two times a day (BID) | ORAL | Status: DC
  Administered 2015-08-30 – 2015-09-02 (×7): 10 mg via ORAL
  Filled 2015-08-30 (×7): qty 1

## 2015-08-30 MED ORDER — IBUPROFEN 100 MG/5ML PO SUSP: 200.0000 mg | Freq: Once | ORAL | Status: DC

## 2015-08-30 MED ORDER — SODIUM CHLORIDE 0.9 % IV BOLUS (SEPSIS)
250.0000 mL | Freq: Once | INTRAVENOUS | Status: AC
  Administered 2015-08-30: 250 mL via INTRAVENOUS

## 2015-08-30 MED ORDER — BACLOFEN 10 MG PO TABS
20.0000 mg | ORAL_TABLET | Freq: Every day | ORAL | Status: DC
  Administered 2015-08-30 – 2015-09-01 (×3): 20 mg via ORAL
  Filled 2015-08-30 (×3): qty 2

## 2015-08-30 MED ORDER — ACETAMINOPHEN 325 MG PO TABS
650.0000 mg | ORAL_TABLET | Freq: Four times a day (QID) | ORAL | Status: DC | PRN
  Administered 2015-08-30 – 2015-08-31 (×2): 650 mg
  Filled 2015-08-30 (×2): qty 2

## 2015-08-30 NOTE — Progress Notes (Signed)
ANTIBIOTIC CONSULT NOTE - INITIAL  Pharmacy Consult for Aztreonam Indication: pneumonia  Allergies  Allergen Reactions  . Cefuroxime Axetil Anaphylaxis  . Ertapenem Other (See Comments)    Rash and confusion-->tolerated Imipenem   . Morphine And Related Other (See Comments)    Changed mental status, confusion, headache, visual hallucination  . Penicillins Anaphylaxis and Other (See Comments)    Tolerated Imipenem; no reaction to 7 day course of amoxicillin in 2015 Has patient had a PCN reaction causing immediate rash, facial/tongue/throat swelling, SOB or lightheadedness with hypotension: Yes Has patient had a PCN reaction causing severe rash involving mucus membranes or skin necrosis: No Has patient had a PCN reaction that required hospitalization Yes Has patient had a PCN reaction occurring within the last 10 years: No If all of the above answers are "NO", then may proceed with Cephalo  . Sulfa Antibiotics Anaphylaxis, Shortness Of Breath and Other (See Comments)  . Tessalon [Benzonatate] Anaphylaxis  . Shellfish Allergy Itching and Other (See Comments)    Took benadryl to alleviate reaction  . Nsaids Other (See Comments)    Risk of bleeding  . Miripirium Rash and Other (See Comments)    Change in mental status    Patient Measurements: Height: 5\' 8"  (172.7 cm) Weight: 118 lb 3.2 oz (53.615 kg) IBW/kg (Calculated) : 68.4 Adjusted Body Weight:   Vital Signs: Temp: 98.2 F (36.8 C) (11/22 0741) Temp Source: Oral (11/22 0741) BP: 105/69 mmHg (11/22 0741) Pulse Rate: 78 (11/22 0800) Intake/Output from previous day: 11/21 0701 - 11/22 0700 In: 4980.7 [P.O.:1080; I.V.:1711.7; NG/GT:760.7; IV Piggyback:908.3] Out: 3150 [Urine:2000; Stool:1150] Intake/Output from this shift: Total I/O In: 121.3 [NG/GT:121.3] Out: 500 [Urine:500]  Labs:  Recent Labs  08/28/15 0453 08/28/15 1027 08/29/15 0439 08/30/15 0510  WBC  --  23.6* 24.6* 26.2*  HGB  --  9.5* 9.6* 9.2*  PLT   --  681* 667* 604*  CREATININE 1.29*  --  1.33* 1.44*   Estimated Creatinine Clearance: 56.4 mL/min (by C-G formula based on Cr of 1.44). No results for input(s): VANCOTROUGH, VANCOPEAK, VANCORANDOM, GENTTROUGH, GENTPEAK, GENTRANDOM, TOBRATROUGH, TOBRAPEAK, TOBRARND, AMIKACINPEAK, AMIKACINTROU, AMIKACIN in the last 72 hours.   Microbiology: Recent Results (from the past 720 hour(s))  Anaerobic culture     Status: None   Collection Time: 08/04/15  7:08 PM  Result Value Ref Range Status   Specimen Description TISSUE LEFT SACRAL  Final   Special Requests NONE  Final   Gram Stain   Final    NO WBC SEEN NO ORGANISMS SEEN Performed at [facility]    Culture   Final    NO ANAEROBES ISOLATED Performed at [facility]    Report Status 08/09/2015 FINAL  Final  Fungus Culture with Smear     Status: None   Collection Time: 08/04/15  7:11 PM  Result Value Ref Range Status   Specimen Description TISSUE LEFT SACRAL  Final   Special Requests NONE  Final   Fungal Smear   Final    NO YEAST OR FUNGAL ELEMENTS SEEN Performed at [facility]    Culture   Final    CANDIDA ALBICANS Performed at [facility]    Report Status 08/27/2015 FINAL  Final  AFB culture with smear     Status: None (Preliminary result)   Collection Time: 08/04/15  7:13 PM  Result Value Ref Range Status   Specimen Description TISSUE LEFT SACRAL  Final   Special Requests NONE  Final  Acid Fast Smear   Final    NO ACID FAST BACILLI SEEN Performed at [facility]    Culture   Final    CULTURE WILL BE EXAMINED FOR 6 WEEKS BEFORE ISSUING A FINAL REPORT Performed at [facility]    Report Status PENDING  Incomplete  Tissue culture     Status: None   Collection Time: 08/04/15  7:18 PM  Result Value Ref Range Status   Specimen Description TISSUE LEFT SACRAL  Final   Special Requests NONE  Final   Gram Stain   Final    NO WBC SEEN NO ORGANISMS SEEN Performed at [facility]    Culture   Final    MODERATE ESCHERICHIA COLI Performed at [facility]    Report Status 08/15/2015 FINAL  Final   Organism ID, Bacteria ESCHERICHIA COLI  Final      Susceptibility   Escherichia coli - MIC*    AMPICILLIN >=32 RESISTANT Resistant     AMPICILLIN/SULBACTAM >=32 RESISTANT Resistant     CEFAZOLIN 8 RESISTANT Resistant     CEFEPIME <=1 SENSITIVE Sensitive     CEFTAZIDIME <=1 SENSITIVE Sensitive     CEFTRIAXONE <=1 SENSITIVE Sensitive     CIPROFLOXACIN <=0.25 SENSITIVE Sensitive     GENTAMICIN <=1 SENSITIVE Sensitive     IMIPENEM <=0.25 SENSITIVE Sensitive     PIP/TAZO <=4 SENSITIVE Sensitive     TOBRAMYCIN <=1 SENSITIVE Sensitive     TRIMETH/SULFA Value in next row Sensitive      <=20 SENSITIVE(NOTE)    * MODERATE ESCHERICHIA COLI  Culture, Urine     Status: None   Collection Time: 08/10/15 10:59 AM  Result Value Ref Range Status   Specimen Description URINE, SUPRAPUBIC  Final   Special Requests NONE  Final   Culture MULTIPLE SPECIES PRESENT, SUGGEST RECOLLECTION  Final   Report Status 08/12/2015 FINAL  Final  Culture, blood (single)     Status: None   Collection Time: 08/13/15 11:45 AM  Result Value Ref Range Status   Specimen Description BLOOD LEFT HAND  Final   Special Requests BOTTLES DRAWN AEROBIC ONLY 5CC  Final   Culture NO GROWTH 5 DAYS  Final   Report Status 08/18/2015 FINAL  Final  C difficile quick scan w PCR reflex     Status: Abnormal   Collection Time: 08/13/15  9:54 PM  Result Value Ref Range Status   C Diff antigen POSITIVE (A) NEGATIVE Final   C Diff toxin NEGATIVE NEGATIVE Final   C Diff interpretation   Final    C. difficile present, but toxin not detected. This indicates colonization. In most cases, this does not require treatment. If patient has signs and symptoms consistent with colitis, consider treatment. Requires ENTERIC precautions.  Blood Culture (routine x 2)     Status: None (Preliminary result)   Collection  Time: 08/25/15  8:51 AM  Result Value Ref Range Status   Specimen Description BLOOD RIGHT ARM  Final   Special Requests BOTTLES DRAWN AEROBIC AND ANAEROBIC 5CC  Final   Culture NO GROWTH 4 DAYS  Final   Report Status PENDING  Incomplete  Blood Culture (routine x 2)     Status: None (Preliminary result)   Collection Time: 08/25/15  8:56 AM  Result Value Ref Range Status   Specimen Description BLOOD LEFT WRIST  Final   Special Requests BOTTLES DRAWN AEROBIC AND ANAEROBIC 5CC  Final   Culture NO GROWTH 4 DAYS  Final   Report Status PENDING  Incomplete  Urine culture     Status: None   Collection Time: 08/25/15 11:45 AM  Result Value Ref Range Status   Specimen Description URINE, CATHETERIZED  Final   Special Requests NONE  Final   Culture MULTIPLE SPECIES PRESENT, SUGGEST RECOLLECTION  Final   Report Status 08/26/2015 FINAL  Final  MRSA PCR Screening     Status: None   Collection Time: 08/25/15  2:23 PM  Result Value Ref Range Status   MRSA by PCR NEGATIVE NEGATIVE Final    Comment:        The GeneXpert MRSA Assay (FDA approved for NASAL specimens only), is one component of a comprehensive MRSA colonization surveillance program. It is not intended to diagnose MRSA infection nor to guide or monitor treatment for MRSA infections.   C difficile quick scan w PCR reflex     Status: Abnormal   Collection Time: 08/25/15 11:20 PM  Result Value Ref Range Status   C Diff antigen POSITIVE (A) NEGATIVE Final   C Diff toxin NEGATIVE NEGATIVE Final   C Diff interpretation   Final    C. difficile present, but toxin not detected. This indicates colonization. In most cases, this does not require treatment. If patient has signs and symptoms consistent with colitis, consider treatment. Requires ENTERIC precautions.  Respiratory virus antigens panel     Status: None   Collection Time: 08/25/15 11:20 PM  Result Value Ref Range Status   Source - RVPAN NASOPHARYNGEAL  Corrected   Respiratory  Syncytial Virus A Negative Negative Final   Respiratory Syncytial Virus B Negative Negative Final   Influenza A Negative Negative Final   Influenza B Negative Negative Final   Parainfluenza 1 Negative Negative Final   Parainfluenza 2 Negative Negative Final   Parainfluenza 3 Negative Negative Final   Metapneumovirus Negative Negative Final   Rhinovirus Negative Negative Final   Adenovirus Negative Negative Final    Comment: (NOTE) Performed At: [facility] [place], [place] [id number] Lindon Romp MD [id number]   Clostridium Difficile by PCR     Status: None   Collection Time: 08/25/15 11:20 PM  Result Value Ref Range Status   Toxigenic C Difficile by pcr NEGATIVE NEGATIVE Final    Medical History: Past Medical History  Diagnosis Date  . GERD (gastroesophageal reflux disease)   . Asthma   . Hx MRSA infection     on face  . Gastroparesis   . Diabetic neuropathy ([place])   . Seizures ([place])   . Family history of anesthesia complication     Pt mother can't have epidural procedures  . Dysrhythmia   . Pneumonia   . Arthritis   . Fibromyalgia   . Stroke ([place]) 01/29/2014    spinal stroke ; "quadriplegic since"  . Type I diabetes mellitus ([place])     sees Dr. Loanne Drilling   . Syncope 02/16/2015    Medications:  Scheduled:  . antiseptic oral rinse  7 mL Mouth Rinse q12n4p  . aztreonam  1 g Intravenous 3 times per day  . baclofen  10 mg Oral BID AC  . baclofen  20 mg Oral QHS  . chlorhexidine  15 mL Mouth Rinse BID  . collagenase  1 application Topical Q M,W,F  . enoxaparin (LOVENOX) injection  40 mg Subcutaneous Q24H  . feeding supplement (GLUCERNA 1.2 CAL)  1,000 mL Per Tube Q24H  . fentaNYL  50 mcg Transdermal Q72H  .  free water  200 mL Per Tube 3 times per day  . insulin aspart  0-20 Units Subcutaneous 6 times per day  . insulin glargine  10 Units Subcutaneous Daily  . magnesium oxide  400 mg Oral BID  . midodrine  20 mg Per Tube TID WC  .  multivitamin with minerals  1 tablet Per Tube Daily  . pantoprazole sodium  40 mg Per Tube Daily  . pregabalin  100 mg Per Tube TID AC  . pregabalin  200 mg Oral QHS  . saccharomyces boulardii  250 mg Oral BID  . sodium chloride  10-40 mL Intracatheter Q12H   Assessment: 29yo male with HCAP.  He has been on Primaxin with continued upward trend of WBC to 26 this AM, as well as increasing Cr.  He has a hx of multi-drug resistant organisms and antibiotic is being changed to Aztreonam.  Cr 1.44 this AM (BL 0.5-1) and it is difficult to estimate his renal fxn based on partial quadriplegic status.  UOP 1.62ml/kg/hr.  Blood cx from 11/17 are ntd.  Goal of Therapy:  Treatment of infection  Plan:  Aztreonam 1g IV q8 Continue to trend renal fxn and watch clinical status F/U cx results.  Gracy Bruins, PharmD Clinical Pharmacist [facility]

## 2015-08-30 NOTE — Progress Notes (Addendum)
Shift Note:  Discussed and educated patient and family on pain throughout the shift.  Sealed leak with his wound vac.  Feeding tube started after 1900 will be stopped on first shift after 0700 today.  Shifted patient in the bed throughout the night.  Patient had elevated temperatures after 0000 and was given a Normal Saline 250 ml bolus via IV, MD aware of current temperature as well after 0400 (see Epic).

## 2015-08-30 NOTE — Progress Notes (Addendum)
PATIENT DETAILS Name: Jason [name] Age: 29 y.o. Sex: male Date of Birth: Oct 29, 1985 Admit Date: 08/25/2015 Admitting Physician Costin Karlyne Greenspan, MD PCP:FRY,STEPHEN A, MD  Brief Narrative: Jason [name] is a 29 y.o. 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 Initial workup revealed possible HCAP on CXR-patient was started on Vancomycin and Imipenem and admitted to the hospital for further evaluation and treatment   Subjective: Febrile last night-encephalopathic this morning. Per mother at bedside-patient was confused at night as well (note-was awake and alert yesterday)  Assessment/Plan: Principal Problem: Sepsis secondary to HCAP: likely secondary to HCAP-blood cultures negative so far, urine culture shows multiple bacterial morphotypes.Influenza PCR/Resp virus panel neg.  Urine Strep Pneumonia/Legionella Ag negative. Infectious disease following, antibiotics now narrative just to Primaxin. Although overall improved-now encephalopathic-per mother-patient has had similar reactions with ertapenem in the past. Given encephalopathy/worsening renal functionfever-spoke with ID-Dr. Valla Leaver suggested we discontinue Primaxin and start Aztreonam-as this maybe drug reaction/fever from primaxin. Continue stepdown monitoring-await further input from infectious disease  Active Problems: Acute encephalopathy: Encephalopathic on admission-but had resolved-only to recur last night/today-not sure if this is related to  Primaxin-mother does give a history of similar symptoms with ertapenem. Stop Primaxin and follow-no new neurological deficits seen on exam.   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 Supportive care.  Acute renal failure: Likely secondary to prerenal azotemia-initially resolved with IV fluids-but now starting to reoccur-not sure if this is acute interstitial nephritis secondary to Primaxin. Stopping Primaxin-start on aztreonam-check CBC with Diff and Urine eosinophils , will decrease IV fluids and follow. (Stool output of 1150 in the last 24 hours is inaccurate per RN)  Leukocytosis: Suspect has some chronic leukocytosis due to sick for decubitus ulcers/chronic inflammation-but still has significant leukocytosis be on his usual baseline-likely secondary to pneumonia. Antibiotics changed restaurant M-follow.  Hyponatremia:? Excessive fluid intake-euvolemic on exam-decrease IV fluids and follow. Has some mild chronic hyponatremia at baseline.  Dysphagia: seen by speech therapy-started on a regular diet.   History of gastroparesis: Likely secondary to diabetes, no vomiting-seem stable at present. Follow, if needed we will start Reglan.  Type 1 diabetes: CBGs stable-continue with Lantus 10 units and SSI. Follow and adjust accordingly  History of neurogenic bladder: Supra pubic catheter in place.  Severe protein calorie malnutrition: On nocturnal tube feedings, nutrition consulted.  Stage IV sacral ulcers/stage III left heel ulcer/Full thickness wound mid back:present prior to admission, seen by wound care RN-not felt to be the source of sepsis  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. IVC filter placed 8/6. Not on A/C due to need for frequent debridements which have caused extensive bleeding, previously on apixaban-on prophylactic Lovenox  Chronic anemia, likely due to chronic disease: Hemoglobin stable.  History of spinal cord infarct with paraplegia, partial  quadriplegia  Chronic pain syndrome: Second of extensive sacral ulcers-continue transdermal fentanyl/oxycodone.  Disposition: Remain inpatient-remain in step down  Antimicrobial agents  See below  Anti-infectives    Start     Dose/Rate  Route Frequency Ordered Stop   08/30/15 1200  aztreonam (AZACTAM) 1 g in dextrose 5 % 50 mL IVPB     1 g 100 mL/hr over 30 Minutes Intravenous 3 times per day 08/30/15 1112     08/26/15 1400  vancomycin (VANCOCIN) 50 mg/mL oral solution 125 mg  Status:  Discontinued     125 mg Oral 4 times daily 08/26/15 1157 08/27/15 1047   08/25/15 1000  vancomycin (VANCOCIN) 500 mg in sodium chloride 0.9 % 100 mL IVPB     500 mg 100 mL/hr over 60 Minutes Intravenous  Once 08/25/15 0943 08/25/15 1151   08/25/15 0930  vancomycin (VANCOCIN) 500 mg in sodium chloride 0.9 % 100 mL IVPB  Status:  Discontinued     500 mg 100 mL/hr over 60 Minutes Intravenous Every 24 hours 08/25/15 0921 08/26/15 1157   08/25/15 0930  imipenem-cilastatin (PRIMAXIN) 250 mg in sodium chloride 0.9 % 100 mL IVPB  Status:  Discontinued     250 mg 200 mL/hr over 30 Minutes Intravenous 3 times per day 08/25/15 [id number] 08/30/15 1022      DVT Prophylaxis: Prophylactic Lovenox   Code Status: Full code  Family Communication Mother at bedside  Procedures: None  CONSULTS:  ID  Time spent 30 minutes-Greater than 50% of this time was spent in counseling, explanation of diagnosis, planning of further management, and coordination of care.  MEDICATIONS: Scheduled Meds: . antiseptic oral rinse  7 mL Mouth Rinse q12n4p  . aztreonam  1 g Intravenous 3 times per day  . baclofen  10 mg Oral BID AC  . baclofen  20 mg Oral QHS  . chlorhexidine  15 mL Mouth Rinse BID  . collagenase  1 application Topical Q M,W,F  . enoxaparin (LOVENOX) injection  40 mg Subcutaneous Q24H  . feeding supplement (GLUCERNA 1.2 CAL)  1,000 mL Per Tube Q24H  . fentaNYL  50 mcg Transdermal Q72H  . free water  200 mL  Per Tube 3 times per day  . insulin aspart  0-20 Units Subcutaneous 6 times per day  . insulin glargine  10 Units Subcutaneous Daily  . magnesium oxide  400 mg Oral BID  . midodrine  20 mg Per Tube TID WC  . multivitamin with minerals  1 tablet Per Tube Daily  . pantoprazole sodium  40 mg Per Tube Daily  . pregabalin  100 mg Per Tube TID AC  . pregabalin  200 mg Oral QHS  . saccharomyces boulardii  250 mg Oral BID  . sodium chloride  10-40 mL Intracatheter Q12H   Continuous Infusions: . sodium chloride 1,000 mL (08/30/15 0809)   PRN Meds:.albuterol, LORazepam, mirtazapine, ondansetron, oxyCODONE, promethazine, sodium chloride    PHYSICAL EXAM: Vital signs in last 24 hours: Filed Vitals:   08/30/15 0741 08/30/15 0800 08/30/15 1132 08/30/15 1149  BP: 105/69   136/81  Pulse: 80 78 83 87  Temp: 98.2 F (36.8 C)   101.2 F (38.4 C)  TempSrc: Oral   Oral  Resp: 26 20 26 21   Height:      Weight:      SpO2: 96% 96% 98% 98%    Weight change: 4.015 kg (8 lb 13.6 oz) Filed Weights   08/28/15 0441 08/29/15 0431 08/30/15 0412  Weight: 53.3 kg (117 lb 8.1 oz) 49.6 kg (109 lb 5.6 oz) 53.615 kg (118 lb 3.2 oz)   Body mass index is 17.98 kg/(m^2).   Gen Exam: Awake-but somewhat confused-slow but clear speech.  Neck:tracheostomy in place est: B/L Clear.  CVS: S1 S2 Regular, no murmurs.  Abdomen: soft, BS +, non tender, non distended. left lower quadrant colostomy  Neurologic: Quadriparesis Skin: No Rash.   Wounds: see above  Intake/Output from previous day:  Intake/Output Summary (Last 24 hours) at 08/30/15 1237 Last data filed at 08/30/15 1149  Gross per 24 hour  Intake   4622 ml  Output   3700 ml  Net    922 ml     LAB RESULTS: CBC  Recent Labs Lab 08/25/15 0856 08/26/15 0341 08/28/15 1027 08/29/15 0439 08/30/15 0510  WBC 28.9* 30.6* 23.6* 24.6* 26.2*  HGB 10.8* 9.4* 9.5* 9.6* 9.2*  HCT 34.7* 31.1* 30.7* 30.3* 29.6*  PLT 797* 759* 681* 667* 604*  MCV 86.1  86.9 84.6 84.2 83.6  MCH 26.8 26.3 26.2 26.7 26.0  MCHC 31.1 30.2 30.9 31.7 31.1  RDW 14.2 14.2 14.1 14.2 14.2  LYMPHSABS 2.3 3.1  --   --   --   MONOABS 2.6* 2.4*  --   --   --   EOSABS 0.6 2.1*  --   --   --   BASOSABS 0.0 0.0  --   --   --     Chemistries   Recent Labs Lab 08/26/15 0341 08/27/15 0350 08/28/15 0453 08/29/15 0439 08/30/15 0510  NA 132* 136 130* 127* 128*  K 4.3 3.9 4.3 5.0 4.8  CL 105 107 98* 95* 95*  CO2 23 24 26 28 28   GLUCOSE 154* 120* 116* 191* 172*  BUN 31* 22* 24* 27* 26*  CREATININE 1.34* 1.18 1.29* 1.33* 1.44*  CALCIUM 13.0* 12.9* 12.5* 12.0* 11.8*    CBG:  Recent Labs Lab 08/29/15 1705 08/29/15 2004 08/30/15 0016 08/30/15 0411 08/30/15 0740  GLUCAP 174* 154* 193* 151* 141*    GFR Estimated Creatinine Clearance: 56.4 mL/min (by C-G formula based on Cr of 1.44).  Coagulation profile No results for input(s): INR, PROTIME in the last 168 hours.  Cardiac Enzymes No results for input(s): CKMB, TROPONINI, MYOGLOBIN in the last 168 hours.  Invalid input(s): CK  Invalid input(s): POCBNP No results for input(s): DDIMER in the last 72 hours. No results for input(s): HGBA1C in the last 72 hours. No results for input(s): CHOL, HDL, LDLCALC, TRIG, CHOLHDL, LDLDIRECT in the last 72 hours. No results for input(s): TSH, T4TOTAL, T3FREE, THYROIDAB in the last 72 hours.  Invalid input(s): FREET3 No results for input(s): VITAMINB12, FOLATE, FERRITIN, TIBC, IRON, RETICCTPCT in the last 72 hours. No results for input(s): LIPASE, AMYLASE in the last 72 hours.  Urine Studies No results for input(s): UHGB, CRYS in the last 72 hours.  Invalid input(s): UACOL, UAPR, USPG, UPH, UTP, UGL, UKET, UBIL, UNIT, UROB, ULEU, UEPI, UWBC, URBC, UBAC, CAST, UCOM, BILUA  MICROBIOLOGY: Recent Results (from the past 240 hour(s))  Blood Culture (routine x 2)     Status: None   Collection Time: 08/25/15  8:51 AM  Result Value Ref Range Status   Specimen  Description BLOOD RIGHT ARM  Final   Special Requests BOTTLES DRAWN AEROBIC AND ANAEROBIC 5CC  Final   Culture NO GROWTH 5 DAYS  Final   Report Status 08/30/2015 FINAL  Final  Blood Culture (routine x 2)     Status: None   Collection Time: 08/25/15  8:56 AM  Result Value Ref Range Status   Specimen Description BLOOD LEFT WRIST  Final   Special Requests BOTTLES DRAWN AEROBIC AND ANAEROBIC 5CC  Final  Culture NO GROWTH 5 DAYS  Final   Report Status 08/30/2015 FINAL  Final  Urine culture     Status: None   Collection Time: 08/25/15 11:45 AM  Result Value Ref Range Status   Specimen Description URINE, CATHETERIZED  Final   Special Requests NONE  Final   Culture MULTIPLE SPECIES PRESENT, SUGGEST RECOLLECTION  Final   Report Status 08/26/2015 FINAL  Final  MRSA PCR Screening     Status: None   Collection Time: 08/25/15  2:23 PM  Result Value Ref Range Status   MRSA by PCR NEGATIVE NEGATIVE Final    Comment:        The GeneXpert MRSA Assay (FDA approved for NASAL specimens only), is one component of a comprehensive MRSA colonization surveillance program. It is not intended to diagnose MRSA infection nor to guide or monitor treatment for MRSA infections.   C difficile quick scan w PCR reflex     Status: Abnormal   Collection Time: 08/25/15 11:20 PM  Result Value Ref Range Status   C Diff antigen POSITIVE (A) NEGATIVE Final   C Diff toxin NEGATIVE NEGATIVE Final   C Diff interpretation   Final    C. difficile present, but toxin not detected. This indicates colonization. In most cases, this does not require treatment. If patient has signs and symptoms consistent with colitis, consider treatment. Requires ENTERIC precautions.  Respiratory virus antigens panel     Status: None   Collection Time: 08/25/15 11:20 PM  Result Value Ref Range Status   Source - RVPAN NASOPHARYNGEAL  Corrected   Respiratory Syncytial Virus A Negative Negative Final   Respiratory Syncytial Virus B Negative  Negative Final   Influenza A Negative Negative Final   Influenza B Negative Negative Final   Parainfluenza 1 Negative Negative Final   Parainfluenza 2 Negative Negative Final   Parainfluenza 3 Negative Negative Final   Metapneumovirus Negative Negative Final   Rhinovirus Negative Negative Final   Adenovirus Negative Negative Final    Comment: (NOTE) Performed At: [facility] [place], [place] [id number] Lindon Romp MD [id number]   Clostridium Difficile by PCR     Status: None   Collection Time: 08/25/15 11:20 PM  Result Value Ref Range Status   Toxigenic C Difficile by pcr NEGATIVE NEGATIVE Final    RADIOLOGY STUDIES/RESULTS: Dg Chest Port 1 View  08/26/2015  CLINICAL DATA:  Status post right PICC placement today. EXAM: PORTABLE CHEST 1 VIEW COMPARISON:  Single view of the chest 08/25/2015. FINDINGS: Right PICC is in place with the tip projecting over the lower superior vena cava. Tracheostomy tube is again seen. Airspace disease in the right mid and lower lung zones persists. Aeration in the right base appears slightly improved. The left lung is clear. Heart size is normal. No pneumothorax. IMPRESSION: Tip of right PICC projects over the lower superior vena cava. Persistent airspace disease in the right shows mild improvement in the lung base. Electronically Signed   By: Inge Rise M.D.   On: 08/26/2015 12:45   Dg Chest Port 1 View  08/25/2015  CLINICAL DATA:  Fever. EXAM: PORTABLE CHEST 1 VIEW COMPARISON:  July 27, 2015. FINDINGS: Tracheostomy is in grossly good position. Cardiomediastinal silhouette appears normal. No pneumothorax or significant pleural effusion is noted. Left lung is clear. Right midlung and basilar opacity is noted concerning for pneumonia. Bony thorax is unremarkable. IMPRESSION: Right-sided pneumonia is noted.  Tracheostomy is in good position. Electronically Signed   By:  Marijo Conception, M.D.   On: 08/25/2015 09:36     Oren Binet, MD  [facility] Pager:[phone]  If 7PM-7AM, please contact night-coverage [email or web address] Password [facility] 08/30/2015, 12:37 PM   LOS: 5 days

## 2015-08-30 NOTE — Progress Notes (Addendum)
[facility] for Infectious Disease    Date of Admission:  08/25/2015   Total days of antibiotics 6        Day 1 aztreonam           ID: Jason [name] is a 29 y.o. male with paraplegia, sacral decub/osteomyelitis admitted for fever, altered mental status Principal Problem:   SIRS (systemic inflammatory response syndrome) ([place]) Active Problems:   Gastroparesis due to DM ([name])   Neurogenic bladder   Acute encephalopathy   Suprapubic catheter ([place])   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 ([place])   HCAP (healthcare-associated pneumonia)   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([place])   Severe protein-calorie malnutrition ([place])   Dysphagia, pharyngoesophageal phase   Chronic anemia   Uncontrolled type 1 diabetes mellitus with hyperglycemia ([place])   Tracheostomy dependent ([name])   Acute on chronic respiratory failure ([name])   Hypomagnesemia   Decubitus ulcer of right perineal ischial region, stage 4 ([name])   Decubitus ulcer of left perineal ischial region, stage 4 ([name])   Decubitus ulcer of sacral region, stage 4 ([name])   Severe dehydration   Acute kidney injury ([place])   Severe sepsis ([name])   FUO (fever of unknown origin)    Subjective: Increasing encephalopathic over the last 36 hr per his mother. afebrile  Interval history ; started hallucinating last evening  Medications:  . antiseptic oral rinse  7 mL Mouth Rinse q12n4p  . aztreonam  1 g Intravenous 3 times per day  . baclofen  10 mg Oral BID AC  . baclofen  20 mg Oral QHS  . chlorhexidine  15 mL Mouth Rinse BID  . collagenase  1 application Topical Q M,W,F  . enoxaparin (LOVENOX) injection  40 mg Subcutaneous Q24H  . feeding supplement (GLUCERNA 1.2 CAL)  1,000 mL Per Tube Q24H  . fentaNYL  50 mcg Transdermal Q72H  . free water  200 mL Per Tube 3 times per day  . insulin aspart  0-20 Units Subcutaneous 6 times per day  . insulin glargine  10 Units Subcutaneous Daily  . magnesium oxide  400 mg Oral BID  .  midodrine  20 mg Per Tube TID WC  . multivitamin with minerals  1 tablet Per Tube Daily  . pantoprazole sodium  40 mg Per Tube Daily  . pregabalin  100 mg Per Tube TID AC  . pregabalin  200 mg Oral QHS  . saccharomyces boulardii  250 mg Oral BID  . sodium chloride  10-40 mL Intracatheter Q12H    Objective: Vital signs in last 24 hours: Temp:  [98.2 F (36.8 C)-101.2 F (38.4 C)] 101.1 F (38.4 C) (11/22 1618) Pulse Rate:  [73-89] 89 (11/22 1618) Resp:  [11-26] 25 (11/22 1618) BP: (105-146)/(69-93) 146/93 mmHg (11/22 1618) SpO2:  [96 %-100 %] 98 % (11/22 1618) Weight:  [118 lb 3.2 oz (53.615 kg)] 118 lb 3.2 oz (53.615 kg) (11/22 [id number]) Physical Exam  Constitutional: He is oriented to person only. He appears chronically ill. No distress.  HENT:  Mouth/Throat: Oropharynx is clear and dry. No oropharyngeal exudate.  Cardiovascular: Normal rate, regular rhythm and normal heart sounds. Exam reveals no gallop and no friction rub.  No murmur heard.  Pulmonary/Chest: Effort normal and breath sounds normal. No respiratory distress. He has no wheezes.  Neurological: flaccid lower extremities, with contracture Skin: Skin is warm and dry. Decub ulcers are wrapped. Did not examin    Lab Results  Recent Labs  08/29/15 0439 08/30/15 0510  WBC 24.6* 26.2*  HGB 9.6* 9.2*  HCT 30.3* 29.6*  NA 127* 128*  K 5.0 4.8  CL 95* 95*  CO2 28 28  BUN 27* 26*  CREATININE 1.33* 1.44*    Microbiology: 11/22 blood cx repeated Studies/Results: No results found.   Assessment/Plan: Hallucinations/encephalopathy = he was being treated with imipenem for aspiration pneumonia/HCAP. He has had AMS with ertapenem and now likely a class effect. Differential did show eosinophilia on 11/18. Will add diff today.   Eosinophilia = at a TLC of 2.1K. Will look over medications to withdraw, first will be carbapenem, since on admit he did not has significant eos  HCAP/ aspiration pna = will transition to  aztreonam for last day of abtx  aki = still having worsening cr. Discussed with primary team that it is worth working up. Possibly ain. Will check urine eos  Sacral decub/osteo = would also get some coverage with aztreonam  Baxter Flattery [facility] Infectious Diseases Cell: [phone] Pager: [phone]  08/30/2015, 4:23 PM

## 2015-08-30 NOTE — Consult Note (Signed)
WOC planned to hook patient to home (Medela) pump today for transfer.  He will not transfer today.  I have written new orders to have bedside staff disconnect current dressing from the NPWT VAC machine and clamp tubing for transfer to home.  Home health/pvt duty staff will apply new dressing and hook to home machine once at home.  The DME equipment is not a pump that our staff have competency check off to use, therefore I have notified the mother that she can take the home equipment with her to the home at the time of transfer and she will make sure the home health staff change the dressing once he is home.  She is agreeable to this, notified CM, bedside nurse of same.  [place], [place]

## 2015-08-30 NOTE — Progress Notes (Signed)
Speech Language Pathology Treatment: Dysphagia  Patient Details Name: Jason [name] MRN: [id number] DOB: Apr 21, 1986 Today's Date: 08/30/2015 Time: 5732-2567 SLP Time Calculation (min) (ACUTE ONLY): 17 min  Assessment / Plan / Recommendation Clinical Impression  Mom reports Jason [name] has had no signs of difficulty swallowing. Today he was drowsy upon SLP arrival, easily awakened; confused today which mom states happens each time he is on this certain antibiotic. Consumed thin liquid and solid texture WFL's. Jason [name]"s bed does not allow him to sit up during meals which has likely been contributor or cause of pneumonia. Mom and case manager report a pillow for his bed/matress is being delivered to house today for proper positioning during meals/snacks. Continue regular texture and thin liquids, no further ST needed.   HPI HPI: 29 y.o. male with history of spinal cord injury with tetraplegia, multiple non healing sacral wounds, PEG status, trach (currently has #6 [place]), ostomy bag, suprapubic catheter, with multiple hospitalizations due to recurrent infections in his urine / decub ulcers / osteomyelitis admitted with AMS / fever / hypotension. CXR right-sided pneumonia is noted. Pt initiated po's during previous visit; MBS 07/15/15 Dys 2, honey liquids and MBS 07/20/15 recommended regular texture and thin liquids. MD suspects pna is from aspiration.      SLP Plan  Discharge SLP treatment due to (comment);All goals met     Recommendations  Diet recommendations: Regular;Thin liquid Liquids provided via: Cup;Straw Medication Administration: Whole meds with puree Supervision: Full supervision/cueing for compensatory strategies;Staff to assist with self feeding Compensations: Slow rate;Small sips/bites Postural Changes and/or Swallow Maneuvers: Seated upright 90 degrees      Patient may use Passy-Muir Speech Valve: During all waking hours (remove during sleep) PMSV Supervision: Intermittent        Oral Care Recommendations: Oral care BID Follow up Recommendations: None Plan: Discharge SLP treatment due to (comment);All goals met   Jason [name] 08/30/2015, 9:47 AM Jason [name] Jason [name].Ed [employer] [phone]

## 2015-08-30 NOTE — Progress Notes (Signed)
Nutrition Follow-up  DOCUMENTATION CODES:   Underweight, Severe malnutrition in context of chronic illness  INTERVENTION:    Continue Glucerna 1.2 formula at 70 ml/hr from 6 PM to 6 AM. Total time is 12 hours.   Continue MVI with minerals daily  Provides: 1008 kcal (53% of needs), 50 grams protein (53% of needs), and 676 ml H2O. Total free water: 1276 ml  NUTRITION DIAGNOSIS:   Inadequate oral intake related to chronic illness as evidenced by  (supplemental TF via PEG tube). Ongoing.   GOAL:   Patient will meet greater than or equal to 90% of their needs Likely met with TF and POs  MONITOR:   TF tolerance, PO intake, Labs, Weight trends, Skin, I & O's  ASSESSMENT:   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 Initial workup revealed possible HCAP on CXR for which will be started on Vancomycin and Imipenem for now. He appears to be very dehydrated, with decreased UOP, hyperglycemic and AKI. Will receive IV fluids. ID was consulted. Will rule out influenza given fever and CXR infiltrate, C diff given abdominal discomfort and loose stools, check UA (although likely to be positive). He has just been in the hospital for a little over a month and discharged 9 days ago.   Pt on regular diet (meal completion 50-80%) Nocturnal feedings: Glucerna 1.2 formula at 70 ml/hr from 6 PM to 6 AM. Free water: 200 ml TID Labs reviewed: Sodium low (128), IVF's decreased (NS @ 20 ml/hr) CBG's: 141-193 +fevers  Diet Order:  Diet regular Room service appropriate?: Yes; Fluid consistency::  Thin  Skin:  Wound (see comment) Left plantar heel with stage 2 pressure injury Left plantar foot near heel with unstageable pressure injury Left anterior foot near small toe stage 2 pressure injury  Left outer foot stage 2 pressure injury  Right ischium with stage 4 pressure injury Left ischium with stage 4 pressure injury Left hip with stage 4 pressure injury Left posterior back with chronic full thickness wound  Last BM:  11/21 1150 ml via colostomy  Height:   Ht Readings from Last 1 Encounters:  08/25/15 5' 8"  (1.727 m)   Weight:   Wt Readings from Last 1 Encounters:  08/30/15 118 lb 3.2 oz (53.615 kg)   Ideal Body Weight:  70 kg  BMI:  Body mass index is 17.98 kg/(m^2).  Estimated Nutritional Needs:   Kcal:  1900-2100  Protein:  95-110 gm  Fluid:  1.9-2.1 L  EDUCATION NEEDS:   No education needs identified at this time  [place], [place], [place] Pager [phone] After Hours Pager

## 2015-08-31 LAB — CBC WITH DIFFERENTIAL/PLATELET
BASOS ABS: 0 10*3/uL (ref 0.0–0.1)
Basophils Relative: 0 %
EOS ABS: 0.5 10*3/uL (ref 0.0–0.7)
EOS PCT: 2 %
HCT: 28.1 % — ABNORMAL LOW (ref 39.0–52.0)
Hemoglobin: 8.6 g/dL — ABNORMAL LOW (ref 13.0–17.0)
Lymphocytes Relative: 22 %
Lymphs Abs: 5.3 10*3/uL — ABNORMAL HIGH (ref 0.7–4.0)
MCH: 25.6 pg — ABNORMAL LOW (ref 26.0–34.0)
MCHC: 30.6 g/dL (ref 30.0–36.0)
MCV: 83.6 fL (ref 78.0–100.0)
MONO ABS: 1.7 10*3/uL — AB (ref 0.1–1.0)
Monocytes Relative: 7 %
NEUTROS PCT: 69 %
Neutro Abs: 16.5 10*3/uL — ABNORMAL HIGH (ref 1.7–7.7)
PLATELETS: 519 10*3/uL — AB (ref 150–400)
RBC: 3.36 MIL/uL — AB (ref 4.22–5.81)
RDW: 14.5 % (ref 11.5–15.5)
WBC: 24 10*3/uL — AB (ref 4.0–10.5)

## 2015-08-31 LAB — COMPREHENSIVE METABOLIC PANEL
ALT: 9 U/L — AB (ref 17–63)
AST: 15 U/L (ref 15–41)
Albumin: 2 g/dL — ABNORMAL LOW (ref 3.5–5.0)
Alkaline Phosphatase: 95 U/L (ref 38–126)
Anion gap: 8 (ref 5–15)
BILIRUBIN TOTAL: 0.4 mg/dL (ref 0.3–1.2)
BUN: 28 mg/dL — ABNORMAL HIGH (ref 6–20)
CHLORIDE: 96 mmol/L — AB (ref 101–111)
CO2: 26 mmol/L (ref 22–32)
CREATININE: 1.44 mg/dL — AB (ref 0.61–1.24)
Calcium: 11.3 mg/dL — ABNORMAL HIGH (ref 8.9–10.3)
Glucose, Bld: 261 mg/dL — ABNORMAL HIGH (ref 65–99)
Potassium: 5.2 mmol/L — ABNORMAL HIGH (ref 3.5–5.1)
Sodium: 130 mmol/L — ABNORMAL LOW (ref 135–145)
TOTAL PROTEIN: 7.3 g/dL (ref 6.5–8.1)

## 2015-08-31 LAB — GLUCOSE, CAPILLARY
GLUCOSE-CAPILLARY: 242 mg/dL — AB (ref 65–99)
GLUCOSE-CAPILLARY: 338 mg/dL — AB (ref 65–99)
GLUCOSE-CAPILLARY: 225 mg/dL — AB (ref 65–99)
GLUCOSE-CAPILLARY: 136 mg/dL — AB (ref 65–99)
GLUCOSE-CAPILLARY: 146 mg/dL — AB (ref 65–99)

## 2015-08-31 MED ORDER — SODIUM POLYSTYRENE SULFONATE 15 GM/60ML PO SUSP
30.0000 g | Freq: Once | ORAL | Status: AC
  Administered 2015-08-31: 30 g via ORAL
  Filled 2015-08-31: qty 120

## 2015-08-31 NOTE — Progress Notes (Signed)
[facility]ious [facility]    Date of Admission:  08/25/2015   Total days of antibiotics 7        Day 2 aztreonam           ID: Jason [name] is a 29 y.o. male with paraplegia, sacral decub/osteomyelitis admitted for fever, altered mental status Principal Problem:   SIRS (systemic inflammatory response syndrome) ([place]) Active Problems:   Gastroparesis due to DM ([name])   Neurogenic bladder   Acute encephalopathy   Suprapubic catheter ([place])   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 ([place])   HCAP (healthcare-associated pneumonia)   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([place])   Severe protein-calorie malnutrition ([place])   Dysphagia, pharyngoesophageal phase   Chronic anemia   Uncontrolled type 1 diabetes mellitus with hyperglycemia ([name])   Tracheostomy dependent ([name])   Acute on chronic respiratory failure ([name])   Hypomagnesemia   Decubitus ulcer of right perineal ischial region, stage 4 ([name])   Decubitus ulcer of left perineal ischial region, stage 4 ([name])   Decubitus ulcer of sacral region, stage 4 ([name])   Severe dehydration   Acute kidney injury ([place])   Severe sepsis ([name])   FUO (fever of unknown origin)   Eosinophilia    Subjective: Febrile to 101.2 yesterday  Interval history ; still has altered mental status  Medications:  . antiseptic oral rinse  7 mL Mouth Rinse q12n4p  . aztreonam  1 g Intravenous 3 times per day  . baclofen  10 mg Oral BID AC  . baclofen  20 mg Oral QHS  . chlorhexidine  15 mL Mouth Rinse BID  . collagenase  1 application Topical Q M,W,F  . enoxaparin (LOVENOX) injection  40 mg Subcutaneous Q24H  . feeding supplement (GLUCERNA 1.2 CAL)  1,000 mL Per Tube Q24H  . fentaNYL  50 mcg Transdermal Q72H  . free water  200 mL Per Tube 3 times per day  . insulin aspart  0-20 Units Subcutaneous 6 times per day  . insulin glargine  10 Units Subcutaneous Daily  . magnesium oxide  400 mg Oral BID  . midodrine  20 mg Per Tube TID WC    . multivitamin with minerals  1 tablet Per Tube Daily  . pantoprazole sodium  40 mg Per Tube Daily  . pregabalin  100 mg Per Tube TID AC  . pregabalin  200 mg Oral QHS  . saccharomyces boulardii  250 mg Oral BID  . sodium chloride  10-40 mL Intracatheter Q12H    Objective: Vital signs in last 24 hours: Temp:  [98.9 F (37.2 C)-101.2 F (38.4 C)] 98.9 F (37.2 C) (11/23 1014) Pulse Rate:  [82-89] 84 (11/23 0749) Resp:  [14-30] 20 (11/23 0749) BP: (111-146)/(76-93) 111/76 mmHg (11/23 0749) SpO2:  [96 %-100 %] 96 % (11/23 0749) Weight:  [120 lb 9.5 oz (54.7 kg)] 120 lb 9.5 oz (54.7 kg) (11/23 0500) Physical Exam  Constitutional: He is seleeping  He appears chronically ill. No distress.  HENT:  Mouth/Throat: Oropharynx is clear and dry. No oropharyngeal exudate. Trach in place Cardiovascular: Normal rate, regular rhythm and normal heart sounds. Exam reveals no gallop and no friction rub.  No murmur heard.  Pulmonary/Chest: Effort normal and breath sounds normal. No respiratory distress. He has no wheezes.  Neurological: flaccid lower extremities, with contracture Skin: Skin is warm and dry. Decub ulcers are wrapped. Did not examin    Lab Results  Recent Labs  08/30/15 0510  08/31/15 0429  WBC 26.2* 24.0*  HGB 9.2* 8.6*  HCT 29.6* 28.1*  NA 128* 130*  K 4.8 5.2*  CL 95* 96*  CO2 28 26  BUN 26* 28*  CREATININE 1.44* 1.44*    Microbiology: 11/22 blood cx repeated Studies/Results: No results found.   Assessment/Plan: Hallucinations/encephalopathy = still probably related to carbapenem exposure. Continue to reassess tomorrow  Eosinophilia = now resolved, possibly due to carbapenem exposure  Fevers = will continue to monitor, treat symptoms. Has pending blood cx from 11/22  HCAP/ aspiration pna = continue with aztreonam for a total of 8 days to treat hcap  aki =  Appears not worsened in the last 24 hrs. Continue with current course and see if it improves with  tomorrow's labs  Sacral decub/osteo = would also get some coverage with aztreonam  Baxter Flattery [facility] for Infectious Diseases Cell: [phone] Pager: [phone]  08/31/2015, 11:30 AM

## 2015-08-31 NOTE — Progress Notes (Signed)
Results for LUTHUR, LINDMARK (MRN [id number]) as of 08/31/2015 08:15  Ref. Range 08/30/2015 16:17 08/30/2015 20:03 08/30/2015 23:35 08/31/2015 03:19 08/31/2015 07:47  Glucose-Capillary Latest Ref Range: 65-99 mg/dL 221 (H) 170 (H) 279 (H) 242 (H) 338 (H)  Noted that CBGs continue to be elevated.  Recommend adding Novolog 3-4 units every 4 hours for tube feeding coverage. Continue Novolog RESISTANT correction scale every 4 hours. May need to increase Lantus to 15 units daily if blood sugars continue to be greater than 180 mg/dl. Will continue to monitor blood sugars while in the hospital. Harvel Ricks RN BSN CDE

## 2015-08-31 NOTE — Consult Note (Addendum)
WOC wound follow up Wound type: 3 areas of stage 4 wounds to sacrum and bilat buttocks with Vac dressing change.  Applied 3 pieces black sponge with hydrocolloid and paste strip to edges to maintain seal.  Bridged 3 areas together over drape and also applied bridge to hip for track pad.  Obtained good seal at 14mm cont suction.  Refer to previous progress notes for full assessment and measurements. Mother at bedside to assess wounds and she is very well-informed regarding wound care and Vac application.  Plan for dressing change on Friday.  Medella negative pressure supplies in room for use after discharge home. Pt tolerated dressing change without difficulty. Dressing change will be changed Friday by a member of the [facility] team if patient is still in the hospital at that time. Please re-consult if further assistance is needed.  Thank-you,  Julien Girt MSN, [place], [place], [place], [place]

## 2015-08-31 NOTE — Progress Notes (Signed)
PATIENT DETAILS Name: Jason [name] Age: 29 y.o. Sex: male Date of Birth: [birth year] Admit Date: 08/25/2015 Admitting Physician Costin Karlyne Greenspan, MD PCP:FRY,STEPHEN A, MD  Brief Narrative: Jason [name] is a 29 y.o. 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 Initial workup revealed possible HCAP on CXR-patient was started on Vancomycin and Imipenem and admitted to the hospital for further evaluation and treatment   Subjective: Febrile this am-still confused.   Assessment/Plan: Principal Problem: Sepsis secondary to HCAP: likely secondary to HCAP-blood cultures negative so far, urine culture shows multiple bacterial morphotypes.Influenza PCR/Resp virus panel neg.  Urine Strep Pneumonia/Legionella Ag negative. Infectious disease following, antibiotics initially narrowed to just Primaxin-but has developed encephalopathy/worsening renal functionfever-after d/w ID-Abx changed to  Aztreonam-as this maybe drug reaction/fever from primaxin (per mother-patient has had similar reactions with ertapenem in the past). Continues to be febrile-but leukocytosis now downtrending. Unfortunately-with many allergies and with history of multi drug resistant organism-limits choices.  Active Problems: Acute encephalopathy: Encephalopathic on admission-but had resolved-only on 11/21 pm-not sure if this is related to [place] does give a history of similar symptoms with ertapenem. Continues to be encephalopathic-Primaxin  stopped 11/22-monitor for one additional day-before considering further work up No new neurological deficits seen on exam.   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 Supportive care.  Acute renal failure: Likely secondary to prerenal azotemia-initially resolved with IV fluids-but recurred-suspicion for AIN-given fever/confusion etc-creatinine seems to have stabilized-continue supportive care. Urine Eosinophils pending.   Leukocytosis: Suspect has some chronic leukocytosis due to sick for decubitus ulcers/chronic inflammation-but still has significant leukocytosis beyond his usual baseline-likely secondary to pneumonia. See above regarding Abx  Hyponatremia:? Excessive fluid intake-euvolemic on exam-decrease IV fluids and follow. Has some mild chronic hyponatremia at baseline.  Dysphagia: seen by speech therapy-started on a regular diet.   History of gastroparesis: Likely secondary to diabetes, no vomiting-seem stable at present. Follow, if needed we will start Reglan.  Type 1 diabetes: CBGs uncontrolled-increase Lantus to 15 units and continue with SSI. Follow and adjust accordingly  History of neurogenic bladder: Supra pubic catheter in place.  Severe protein calorie malnutrition: On nocturnal tube feedings, nutrition consulted-appreciate assistance  Stage IV sacral ulcers/stage III left heel ulcer/Full thickness wound mid back:present prior to admission, seen by wound care RN-not felt to be the source of sepsis  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. IVC filter placed 8/6. Not on A/C due to need for frequent debridements which have caused extensive bleeding, previously on apixaban-on prophylactic Lovenox  Chronic anemia, likely due to chronic disease: Hemoglobin stable-  History of spinal cord infarct with paraplegia, partial quadriplegia  Chronic pain syndrome: Second of extensive sacral ulcers-continue transdermal  fentanyl/oxycodone.  Disposition: Remain inpatient-remain in step down  Antimicrobial agents  See below  Anti-infectives    Start     Dose/Rate Route Frequency Ordered Stop   08/30/15 1200  aztreonam (AZACTAM) 1 g in dextrose 5 % 50 mL IVPB     1 g 100  mL/hr over 30 Minutes Intravenous 3 times per day 08/30/15 1112     08/26/15 1400  vancomycin (VANCOCIN) 50 mg/mL oral solution 125 mg  Status:  Discontinued     125 mg Oral 4 times daily 08/26/15 1157 08/27/15 1047   08/25/15 1000  vancomycin (VANCOCIN) 500 mg in sodium chloride 0.9 % 100 mL IVPB     500 mg 100 mL/hr over 60 Minutes Intravenous  Once 08/25/15 0943 08/25/15 1151   08/25/15 0930  vancomycin (VANCOCIN) 500 mg in sodium chloride 0.9 % 100 mL IVPB  Status:  Discontinued     500 mg 100 mL/hr over 60 Minutes Intravenous Every 24 hours 08/25/15 0921 08/26/15 1157   08/25/15 0930  imipenem-cilastatin (PRIMAXIN) 250 mg in sodium chloride 0.9 % 100 mL IVPB  Status:  Discontinued     250 mg 200 mL/hr over 30 Minutes Intravenous 3 times per day 08/25/15 [id number] 08/30/15 1022      DVT Prophylaxis: Prophylactic Lovenox   Code Status: Full code  Family Communication Mother at bedside  Procedures: None  CONSULTS:  ID  Time spent 30 minutes-Greater than 50% of this time was spent in counseling, explanation of diagnosis, planning of further management, and coordination of care.  MEDICATIONS: Scheduled Meds: . antiseptic oral rinse  7 mL Mouth Rinse q12n4p  . aztreonam  1 g Intravenous 3 times per day  . baclofen  10 mg Oral BID AC  . baclofen  20 mg Oral QHS  . chlorhexidine  15 mL Mouth Rinse BID  . collagenase  1 application Topical Q M,W,F  . enoxaparin (LOVENOX) injection  40 mg Subcutaneous Q24H  . feeding supplement (GLUCERNA 1.2 CAL)  1,000 mL Per Tube Q24H  . fentaNYL  50 mcg Transdermal Q72H  . free water  200 mL Per Tube 3 times per day  . insulin aspart  0-20 Units Subcutaneous 6 times per day  .  insulin glargine  10 Units Subcutaneous Daily  . magnesium oxide  400 mg Oral BID  . midodrine  20 mg Per Tube TID WC  . multivitamin with minerals  1 tablet Per Tube Daily  . pantoprazole sodium  40 mg Per Tube Daily  . pregabalin  100 mg Per Tube TID AC  . pregabalin  200 mg Oral QHS  . saccharomyces boulardii  250 mg Oral BID  . sodium chloride  10-40 mL Intracatheter Q12H   Continuous Infusions: . sodium chloride 20 mL/hr at 08/31/15 0600   PRN Meds:.acetaminophen, albuterol, LORazepam, mirtazapine, ondansetron, oxyCODONE, promethazine, sodium chloride    PHYSICAL EXAM: Vital signs in last 24 hours: Filed Vitals:   08/31/15 0748 08/31/15 0749 08/31/15 1014 08/31/15 1154  BP: 111/76 111/76  113/73  Pulse: 84 84  85  Temp: 100.6 F (38.1 C)  98.9 F (37.2 C)   TempSrc: Oral  Axillary   Resp: 18 20  16   Height:      Weight:      SpO2: 97% 96%  95%    Weight change: 1.085 kg (2 lb 6.3 oz) Filed Weights   08/29/15 0431 08/30/15 0412 08/31/15 0500  Weight: 49.6 kg (109 lb 5.6 oz) 53.615 kg (118 lb 3.2 oz) 54.7 kg (120 lb 9.5 oz)   Body mass index is 18.34 kg/(m^2).   Gen Exam: Awake- confused-slow but clear speech.  Neck:tracheostomy in place est: B/L Clear.  CVS: S1 S2 Regular, no murmurs.  Abdomen: soft, BS +, non tender, non distended. left lower quadrant  colostomy  Neurologic: Quadriparesis Skin: No Rash.   Wounds: see above  Intake/Output from previous day:  Intake/Output Summary (Last 24 hours) at 08/31/15 1156 Last data filed at 08/31/15 0800  Gross per 24 hour  Intake    990 ml  Output   3075 ml  Net  -2085 ml     LAB RESULTS: CBC  Recent Labs Lab 08/25/15 0856 08/26/15 0341 08/28/15 1027 08/29/15 0439 08/30/15 0510 08/31/15 0429  WBC 28.9* 30.6* 23.6* 24.6* 26.2* 24.0*  HGB 10.8* 9.4* 9.5* 9.6* 9.2* 8.6*  HCT 34.7* 31.1* 30.7* 30.3* 29.6* 28.1*  PLT 797* 759* 681* 667* 604* 519*  MCV 86.1 86.9 84.6 84.2 83.6 83.6  MCH 26.8 26.3 26.2  26.7 26.0 25.6*  MCHC 31.1 30.2 30.9 31.7 31.1 30.6  RDW 14.2 14.2 14.1 14.2 14.2 14.5  LYMPHSABS 2.3 3.1  --   --   --  5.3*  MONOABS 2.6* 2.4*  --   --   --  1.7*  EOSABS 0.6 2.1*  --   --   --  0.5  BASOSABS 0.0 0.0  --   --   --  0.0    Chemistries   Recent Labs Lab 08/27/15 0350 08/28/15 0453 08/29/15 0439 08/30/15 0510 08/31/15 0429  NA 136 130* 127* 128* 130*  K 3.9 4.3 5.0 4.8 5.2*  CL 107 98* 95* 95* 96*  CO2 24 26 28 28 26   GLUCOSE 120* 116* 191* 172* 261*  BUN 22* 24* 27* 26* 28*  CREATININE 1.18 1.29* 1.33* 1.44* 1.44*  CALCIUM 12.9* 12.5* 12.0* 11.8* 11.3*    CBG:  Recent Labs Lab 08/30/15 1617 08/30/15 2003 08/30/15 2335 08/31/15 0319 08/31/15 0747  GLUCAP 221* 170* 279* 242* 338*    GFR Estimated Creatinine Clearance: 57.5 mL/min (by C-G formula based on Cr of 1.44).  Coagulation profile No results for input(s): INR, PROTIME in the last 168 hours.  Cardiac Enzymes No results for input(s): CKMB, TROPONINI, MYOGLOBIN in the last 168 hours.  Invalid input(s): CK  Invalid input(s): POCBNP No results for input(s): DDIMER in the last 72 hours. No results for input(s): HGBA1C in the last 72 hours. No results for input(s): CHOL, HDL, LDLCALC, TRIG, CHOLHDL, LDLDIRECT in the last 72 hours. No results for input(s): TSH, T4TOTAL, T3FREE, THYROIDAB in the last 72 hours.  Invalid input(s): FREET3 No results for input(s): VITAMINB12, FOLATE, FERRITIN, TIBC, IRON, RETICCTPCT in the last 72 hours. No results for input(s): LIPASE, AMYLASE in the last 72 hours.  Urine Studies No results for input(s): UHGB, CRYS in the last 72 hours.  Invalid input(s): UACOL, UAPR, USPG, UPH, UTP, UGL, UKET, UBIL, UNIT, UROB, ULEU, UEPI, UWBC, URBC, UBAC, CAST, UCOM, BILUA  MICROBIOLOGY: Recent Results (from the past 240 hour(s))  Blood Culture (routine x 2)     Status: None   Collection Time: 08/25/15  8:51 AM  Result Value Ref Range Status   Specimen Description  BLOOD RIGHT ARM  Final   Special Requests BOTTLES DRAWN AEROBIC AND ANAEROBIC 5CC  Final   Culture NO GROWTH 5 DAYS  Final   Report Status 08/30/2015 FINAL  Final  Blood Culture (routine x 2)     Status: None   Collection Time: 08/25/15  8:56 AM  Result Value Ref Range Status   Specimen Description BLOOD LEFT WRIST  Final   Special Requests BOTTLES DRAWN AEROBIC AND ANAEROBIC 5CC  Final   Culture NO GROWTH 5 DAYS  Final   Report Status  08/30/2015 FINAL  Final  Urine culture     Status: None   Collection Time: 08/25/15 11:45 AM  Result Value Ref Range Status   Specimen Description URINE, CATHETERIZED  Final   Special Requests NONE  Final   Culture MULTIPLE SPECIES PRESENT, SUGGEST RECOLLECTION  Final   Report Status 08/26/2015 FINAL  Final  MRSA PCR Screening     Status: None   Collection Time: 08/25/15  2:23 PM  Result Value Ref Range Status   MRSA by PCR NEGATIVE NEGATIVE Final    Comment:        The GeneXpert MRSA Assay (FDA approved for NASAL specimens only), is one component of a comprehensive MRSA colonization surveillance program. It is not intended to diagnose MRSA infection nor to guide or monitor treatment for MRSA infections.   C difficile quick scan w PCR reflex     Status: Abnormal   Collection Time: 08/25/15 11:20 PM  Result Value Ref Range Status   C Diff antigen POSITIVE (A) NEGATIVE Final   C Diff toxin NEGATIVE NEGATIVE Final   C Diff interpretation   Final    C. difficile present, but toxin not detected. This indicates colonization. In most cases, this does not require treatment. If patient has signs and symptoms consistent with colitis, consider treatment. Requires ENTERIC precautions.  Respiratory virus antigens panel     Status: None   Collection Time: 08/25/15 11:20 PM  Result Value Ref Range Status   Source - RVPAN NASOPHARYNGEAL  Corrected   Respiratory Syncytial Virus A Negative Negative Final   Respiratory Syncytial Virus B Negative Negative  Final   Influenza A Negative Negative Final   Influenza B Negative Negative Final   Parainfluenza 1 Negative Negative Final   Parainfluenza 2 Negative Negative Final   Parainfluenza 3 Negative Negative Final   Metapneumovirus Negative Negative Final   Rhinovirus Negative Negative Final   Adenovirus Negative Negative Final    Comment: (NOTE) Performed At: [facility] [address] [place], [place] [id number] Lindon Romp MD [id number]   Clostridium Difficile by PCR     Status: None   Collection Time: 08/25/15 11:20 PM  Result Value Ref Range Status   Toxigenic C Difficile by pcr NEGATIVE NEGATIVE Final  Culture, blood (routine x 2)     Status: None (Preliminary result)   Collection Time: 08/30/15  2:54 PM  Result Value Ref Range Status   Specimen Description BLOOD LEFT HAND  Final   Special Requests IN PEDIATRIC BOTTLE  2CC  Final   Culture NO GROWTH < 24 HOURS  Final   Report Status PENDING  Incomplete  Culture, blood (routine x 2)     Status: None (Preliminary result)   Collection Time: 08/30/15  3:05 PM  Result Value Ref Range Status   Specimen Description BLOOD RIGHT HAND  Final   Special Requests BOTTLES DRAWN AEROBIC ONLY  3CC  Final   Culture NO GROWTH < 24 HOURS  Final   Report Status PENDING  Incomplete    RADIOLOGY STUDIES/RESULTS: Dg Chest Port 1 View  08/26/2015  CLINICAL DATA:  Status post right PICC placement today. EXAM: PORTABLE CHEST 1 VIEW COMPARISON:  Single view of the chest 08/25/2015. FINDINGS: Right PICC is in place with the tip projecting over the lower superior vena cava. Tracheostomy tube is again seen. Airspace disease in the right mid and lower lung zones persists. Aeration in the right base appears slightly improved. The left lung is clear. Heart size  is normal. No pneumothorax. IMPRESSION: Tip of right PICC projects over the lower superior vena cava. Persistent airspace disease in the right shows mild improvement in the lung base.  Electronically Signed   By: Inge Rise M.D.   On: 08/26/2015 12:45   Dg Chest Port 1 View  08/25/2015  CLINICAL DATA:  Fever. EXAM: PORTABLE CHEST 1 VIEW COMPARISON:  July 27, 2015. FINDINGS: Tracheostomy is in grossly good position. Cardiomediastinal silhouette appears normal. No pneumothorax or significant pleural effusion is noted. Left lung is clear. Right midlung and basilar opacity is noted concerning for pneumonia. Bony thorax is unremarkable. IMPRESSION: Right-sided pneumonia is noted.  Tracheostomy is in good position. Electronically Signed   By: Marijo Conception, M.D.   On: 08/25/2015 09:36    Oren Binet, MD  [facility] Pager:[phone]  If 7PM-7AM, please contact night-coverage [email or web address] Password [email or web address] 08/31/2015, 11:56 AM   LOS: 6 days

## 2015-09-01 LAB — CBC WITH DIFFERENTIAL/PLATELET
BASOS ABS: 0.3 10*3/uL — AB (ref 0.0–0.1)
BASOS PCT: 1 %
EOS PCT: 4 %
Eosinophils Absolute: 1.2 10*3/uL — ABNORMAL HIGH (ref 0.0–0.7)
HEMATOCRIT: 28.2 % — AB (ref 39.0–52.0)
HEMOGLOBIN: 8.9 g/dL — AB (ref 13.0–17.0)
LYMPHS PCT: 14 %
Lymphs Abs: 4.3 10*3/uL — ABNORMAL HIGH (ref 0.7–4.0)
MCH: 26.8 pg (ref 26.0–34.0)
MCHC: 31.6 g/dL (ref 30.0–36.0)
MCV: 84.9 fL (ref 78.0–100.0)
MONOS PCT: 16 %
Monocytes Absolute: 4.9 10*3/uL — ABNORMAL HIGH (ref 0.1–1.0)
NEUTROS ABS: 20.2 10*3/uL — AB (ref 1.7–7.7)
Neutrophils Relative %: 65 %
Platelets: 586 10*3/uL — ABNORMAL HIGH (ref 150–400)
RBC: 3.32 MIL/uL — ABNORMAL LOW (ref 4.22–5.81)
RDW: 15 % (ref 11.5–15.5)
WBC: 30.9 10*3/uL — AB (ref 4.0–10.5)

## 2015-09-01 LAB — GLUCOSE, CAPILLARY
GLUCOSE-CAPILLARY: 139 mg/dL — AB (ref 65–99)
GLUCOSE-CAPILLARY: 79 mg/dL (ref 65–99)
GLUCOSE-CAPILLARY: 160 mg/dL — AB (ref 65–99)
Glucose-Capillary: 298 mg/dL — ABNORMAL HIGH (ref 65–99)
Glucose-Capillary: 164 mg/dL — ABNORMAL HIGH (ref 65–99)
Glucose-Capillary: 84 mg/dL (ref 65–99)

## 2015-09-01 LAB — BASIC METABOLIC PANEL
ANION GAP: 6 (ref 5–15)
BUN: 29 mg/dL — AB (ref 6–20)
CO2: 28 mmol/L (ref 22–32)
Calcium: 10.7 mg/dL — ABNORMAL HIGH (ref 8.9–10.3)
Chloride: 97 mmol/L — ABNORMAL LOW (ref 101–111)
Creatinine, Ser: 1.31 mg/dL — ABNORMAL HIGH (ref 0.61–1.24)
Glucose, Bld: 138 mg/dL — ABNORMAL HIGH (ref 65–99)
POTASSIUM: 4.8 mmol/L (ref 3.5–5.1)
SODIUM: 131 mmol/L — AB (ref 135–145)

## 2015-09-01 MED ORDER — DIPHENHYDRAMINE HCL 50 MG/ML IJ SOLN
12.5000 mg | Freq: Four times a day (QID) | INTRAMUSCULAR | Status: DC | PRN
  Administered 2015-09-01 – 2015-09-02 (×3): 12.5 mg via INTRAVENOUS
  Filled 2015-09-01 (×3): qty 1

## 2015-09-01 NOTE — Progress Notes (Signed)
PATIENT DETAILS Name: Jason [name] Age: 29 y.o. Sex: male Date of Birth: [birth year] Admit Date: 08/25/2015 Admitting Physician Costin Karlyne Greenspan, MD PCP:FRY,STEPHEN A, MD  Brief Narrative: Jason [name] is a 30 y.o. 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 Initial workup revealed possible HCAP on CXR-patient was started on Vancomycin and Imipenem and admitted to the hospital for further evaluation and treatment. Patient was seen by infectious disease, antibiotics were narrowed down to just imipenem-unfortunately hospital course has been complicated by development of confusion/acute renal failure/fever-that is currently thought to be secondary to drug reaction from imipenem. Patient was then switched to Aztreonam-with complete resolution of confusion and improvement in renal function. Since patient has completed 8 days of IV antibiotics-plans are to discontinue Aztreonam on 11/24 and monitor off antibiotics.  Subjective: Awake/Alert this am-complaining of itching in the post left shoulder area  Assessment/Plan: Principal Problem: Sepsis secondary to HCAP: likely secondary to HCAP-blood cultures negative so far, urine culture shows multiple bacterial morphotypes.Influenza PCR/Resp virus panel neg.  Urine Strep Pneumonia/Legionella Ag negative. Infectious disease following, antibiotics initially narrowed to just Primaxin-but developed encephalopathy/worsening renal function/fever. Per mother-patient has had  similar reactions with ertapenem in the past-since Drug reaction was suspected-patient was switched to Aztreonam. He now is awake and alert, renal function has started to improve, but still has persistent leukocytosis. Fever curve also starting to improve. Since patient has completed 8 days of antibiotics for HCAP-after discussion with infectious disease, we will discontinue all antibiotics on 11/24 and monitor off antibiotics. Plan is to reculture/repeat x-ray if significantly febrile.Unfortunately-with many allergies and with history of multi drug resistant organism- choices are limited  Active Problems: Acute encephalopathy: Encephalopathic on admission-but had resolved-only to recur on 11/21 pm-felt to be secondary to Primaxin. As noted above per patients mother-patient does have history of similar symptoms with ertapenem. Primaxin was stopped on 11/22, Aztreonam was subsequently started-now patient is completely awake and alert.   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 Supportive care.  Acute renal failure: Likely secondary to prerenal azotemia-initially resolved with IV fluids-but recurred-suspicion for AIN-given fever/confusion etc-creatinine now improving-after Primaxin discontinued. Urine Eosinophils pending.   Leukocytosis: Suspect has some chronic leukocytosis due to sick for decubitus ulcers/chronic inflammation-but still has significant leukocytosis beyond his usual baseline. See above regarding Abx  Hyponatremia:? Excessive fluid intake-euvolemic on exam-decrease IV fluids and follow. Has some mild chronic hyponatremia at baseline.  Dysphagia: seen by speech therapy-started on a regular diet.   History of gastroparesis: Likely secondary to diabetes, no vomiting-seem stable at present. Follow, if needed we will start Reglan.  Type 1 diabetes: CBGs better controlled-continue Lantus to 15 units and continue with SSI. Follow and adjust  accordingly  History of neurogenic bladder: Supra pubic catheter in place.  Severe protein calorie malnutrition: On nocturnal tube feedings, nutrition consulted-appreciate assistance  Stage IV sacral ulcers/stage III left heel ulcer/Full thickness wound mid back:present prior to admission, seen by wound care RN-not felt to be  the source of sepsis  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. IVC filter placed 8/6. Not on A/C due to need for frequent debridements which have caused extensive bleeding, previously on apixaban-on prophylactic Lovenox  Chronic anemia, likely due to chronic disease: Hemoglobin stable-  History of spinal cord infarct with paraplegia, partial quadriplegia  Chronic pain syndrome: Second of extensive sacral ulcers-continue transdermal fentanyl/oxycodone.  Disposition: Remain inpatient-remain in step down-patient medically complex-requires significant nursing care.Plan is home in the next few days if continues to improve  Antimicrobial agents  See below  Anti-infectives    Start     Dose/Rate Route Frequency Ordered Stop   08/30/15 1200  aztreonam (AZACTAM) 1 g in dextrose 5 % 50 mL IVPB  Status:  Discontinued     1 g 100 mL/hr over 30 Minutes Intravenous 3 times per day 08/30/15 1112 09/01/15 1019   08/26/15 1400  vancomycin (VANCOCIN) 50 mg/mL oral solution 125 mg  Status:  Discontinued     125 mg Oral 4 times daily 08/26/15 1157 08/27/15 1047   08/25/15 1000  vancomycin (VANCOCIN) 500 mg in sodium chloride 0.9 % 100 mL IVPB     500 mg 100 mL/hr over 60 Minutes Intravenous  Once 08/25/15 0943 08/25/15 1151   08/25/15 0930  vancomycin (VANCOCIN) 500 mg in sodium chloride 0.9 % 100 mL IVPB  Status:  Discontinued     500 mg 100 mL/hr over 60 Minutes Intravenous Every 24 hours 08/25/15 0921 08/26/15 1157   08/25/15 0930  imipenem-cilastatin (PRIMAXIN) 250 mg in sodium chloride 0.9 % 100 mL IVPB  Status:  Discontinued     250 mg 200 mL/hr over 30 Minutes Intravenous 3 times  per day 08/25/15 [id number] 08/30/15 1022      DVT Prophylaxis: Prophylactic Lovenox   Code Status: Full code  Family Communication Mother at bedside  Procedures: None  CONSULTS:  ID  Time spent 30 minutes-Greater than 50% of this time was spent in counseling, explanation of diagnosis, planning of further management, and coordination of care.  MEDICATIONS: Scheduled Meds: . antiseptic oral rinse  7 mL Mouth Rinse q12n4p  . baclofen  10 mg Oral BID AC  . baclofen  20 mg Oral QHS  . chlorhexidine  15 mL Mouth Rinse BID  . collagenase  1 application Topical Q M,W,F  . enoxaparin (LOVENOX) injection  40 mg Subcutaneous Q24H  . feeding supplement (GLUCERNA 1.2 CAL)  1,000 mL Per Tube Q24H  . fentaNYL  50 mcg Transdermal Q72H  . free water  200 mL Per Tube 3 times per day  . insulin aspart  0-20 Units Subcutaneous 6 times per day  . insulin glargine  10 Units Subcutaneous Daily  . magnesium oxide  400 mg Oral BID  . midodrine  20 mg Per Tube TID WC  . multivitamin with minerals  1 tablet Per Tube Daily  . pantoprazole sodium  40 mg Per Tube Daily  . pregabalin  100 mg Per Tube TID AC  . pregabalin  200 mg Oral QHS  . saccharomyces boulardii  250 mg Oral BID  . sodium chloride  10-40 mL Intracatheter Q12H   Continuous Infusions: . sodium chloride 20 mL/hr at 08/31/15 0600   PRN Meds:.acetaminophen, albuterol, diphenhydrAMINE, LORazepam, mirtazapine, ondansetron, oxyCODONE, promethazine, sodium chloride    PHYSICAL EXAM: Vital signs in last 24 hours: Filed Vitals:   09/01/15 0500 09/01/15 0600 09/01/15 0822 09/01/15 1000  BP:  94/64 100/58 116/63  Pulse:  86 98 80  Temp:  99.5 F (37.5 C)   TempSrc:   Oral   Resp:  14 15 13   Height:      Weight: 52.4 kg (115 lb 8.3 oz)     SpO2:  97% 95% 96%    Weight change: -2.3 kg (-5 lb 1.1 oz) Filed Weights   08/30/15 0412 08/31/15 0500 09/01/15 0500  Weight: 53.615 kg (118 lb 3.2 oz) 54.7 kg (120 lb 9.5 oz) 52.4 kg (115  lb 8.3 oz)   Body mass index is 17.57 kg/(m^2).   Gen Exam: Awake and alert today- clear speech.  Neck:tracheostomy in place est: B/L Clear.  CVS: S1 S2 Regular, no murmurs.  Abdomen: soft, BS +, non tender, non distended. left lower quadrant colostomy  Neurologic: Quadriparesis Skin: No Rash.   Wounds: see above  Intake/Output from previous day:  Intake/Output Summary (Last 24 hours) at 09/01/15 1045 Last data filed at 09/01/15 1000  Gross per 24 hour  Intake   2340 ml  Output   3750 ml  Net  -1410 ml     LAB RESULTS: CBC  Recent Labs Lab 08/26/15 0341 08/28/15 1027 08/29/15 0439 08/30/15 0510 08/31/15 0429 09/01/15 0617  WBC 30.6* 23.6* 24.6* 26.2* 24.0* 30.9*  HGB 9.4* 9.5* 9.6* 9.2* 8.6* 8.9*  HCT 31.1* 30.7* 30.3* 29.6* 28.1* 28.2*  PLT 759* 681* 667* 604* 519* 586*  MCV 86.9 84.6 84.2 83.6 83.6 84.9  MCH 26.3 26.2 26.7 26.0 25.6* 26.8  MCHC 30.2 30.9 31.7 31.1 30.6 31.6  RDW 14.2 14.1 14.2 14.2 14.5 15.0  LYMPHSABS 3.1  --   --   --  5.3* 4.3*  MONOABS 2.4*  --   --   --  1.7* 4.9*  EOSABS 2.1*  --   --   --  0.5 1.2*  BASOSABS 0.0  --   --   --  0.0 0.3*    Chemistries   Recent Labs Lab 08/28/15 0453 08/29/15 0439 08/30/15 0510 08/31/15 0429 09/01/15 0617  NA 130* 127* 128* 130* 131*  K 4.3 5.0 4.8 5.2* 4.8  CL 98* 95* 95* 96* 97*  CO2 26 28 28 26 28   GLUCOSE 116* 191* 172* 261* 138*  BUN 24* 27* 26* 28* 29*  CREATININE 1.29* 1.33* 1.44* 1.44* 1.31*  CALCIUM 12.5* 12.0* 11.8* 11.3* 10.7*    CBG:  Recent Labs Lab 08/31/15 1619 08/31/15 2005 09/01/15 0031 09/01/15 0417 09/01/15 0840  GLUCAP 136* 146* 298* 139* 164*    GFR Estimated Creatinine Clearance: 60.6 mL/min (by C-G formula based on Cr of 1.31).  Coagulation profile No results for input(s): INR, PROTIME in the last 168 hours.  Cardiac Enzymes No results for input(s): CKMB, TROPONINI, MYOGLOBIN in the last 168 hours.  Invalid input(s): CK  Invalid input(s):  POCBNP No results for input(s): DDIMER in the last 72 hours. No results for input(s): HGBA1C in the last 72 hours. No results for input(s): CHOL, HDL, LDLCALC, TRIG, CHOLHDL, LDLDIRECT in the last 72 hours. No results for input(s): TSH, T4TOTAL, T3FREE, THYROIDAB in the last 72 hours.  Invalid input(s): FREET3 No results for input(s): VITAMINB12, FOLATE, FERRITIN, TIBC, IRON, RETICCTPCT in the last 72 hours. No results for input(s): LIPASE, AMYLASE in the last 72 hours.  Urine Studies No results for input(s): UHGB, CRYS in the last 72 hours.  Invalid input(s): UACOL, UAPR, USPG, UPH, UTP, UGL, UKET, UBIL, UNIT, UROB, ULEU, UEPI, UWBC, URBC, UBAC, CAST, UCOM, BILUA  MICROBIOLOGY: Recent Results (from the past 240 hour(s))  Blood Culture (routine x 2)     Status: None   Collection Time: 08/25/15  8:51 AM  Result Value Ref Range Status   Specimen Description BLOOD RIGHT ARM  Final   Special Requests BOTTLES DRAWN AEROBIC AND ANAEROBIC 5CC  Final   Culture NO GROWTH 5 DAYS  Final   Report Status 08/30/2015 FINAL  Final  Blood Culture (routine x 2)     Status: None   Collection Time: 08/25/15  8:56 AM  Result Value Ref Range Status   Specimen Description BLOOD LEFT WRIST  Final   Special Requests BOTTLES DRAWN AEROBIC AND ANAEROBIC 5CC  Final   Culture NO GROWTH 5 DAYS  Final   Report Status 08/30/2015 FINAL  Final  Urine culture     Status: None   Collection Time: 08/25/15 11:45 AM  Result Value Ref Range Status   Specimen Description URINE, CATHETERIZED  Final   Special Requests NONE  Final   Culture MULTIPLE SPECIES PRESENT, SUGGEST RECOLLECTION  Final   Report Status 08/26/2015 FINAL  Final  MRSA PCR Screening     Status: None   Collection Time: 08/25/15  2:23 PM  Result Value Ref Range Status   MRSA by PCR NEGATIVE NEGATIVE Final    Comment:        The GeneXpert MRSA Assay (FDA approved for NASAL specimens only), is one component of a comprehensive MRSA  colonization surveillance program. It is not intended to diagnose MRSA infection nor to guide or monitor treatment for MRSA infections.   C difficile quick scan w PCR reflex     Status: Abnormal   Collection Time: 08/25/15 11:20 PM  Result Value Ref Range Status   C Diff antigen POSITIVE (A) NEGATIVE Final   C Diff toxin NEGATIVE NEGATIVE Final   C Diff interpretation   Final    C. difficile present, but toxin not detected. This indicates colonization. In most cases, this does not require treatment. If patient has signs and symptoms consistent with colitis, consider treatment. Requires ENTERIC precautions.  Respiratory virus antigens panel     Status: None   Collection Time: 08/25/15 11:20 PM  Result Value Ref Range Status   Source - RVPAN NASOPHARYNGEAL  Corrected   Respiratory Syncytial Virus A Negative Negative Final   Respiratory Syncytial Virus B Negative Negative Final   Influenza A Negative Negative Final   Influenza B Negative Negative Final   Parainfluenza 1 Negative Negative Final   Parainfluenza 2 Negative Negative Final   Parainfluenza 3 Negative Negative Final   Metapneumovirus Negative Negative Final   Rhinovirus Negative Negative Final   Adenovirus Negative Negative Final    Comment: (NOTE) Performed At: [facility] [address] [place], [place] [id number] Lindon Romp MD [id number]   Clostridium Difficile by PCR     Status: None   Collection Time: 08/25/15 11:20 PM  Result Value Ref Range Status   Toxigenic C Difficile by pcr NEGATIVE NEGATIVE Final  Culture, blood (routine x 2)     Status: None (Preliminary result)   Collection Time: 08/30/15  2:54 PM  Result Value Ref Range Status   Specimen Description BLOOD LEFT HAND  Final   Special Requests IN PEDIATRIC BOTTLE  2CC  Final   Culture NO GROWTH 2 DAYS  Final   Report Status PENDING  Incomplete  Culture, blood (routine x 2)     Status: None (Preliminary result)   Collection Time:  08/30/15  3:05 PM  Result Value  Ref Range Status   Specimen Description BLOOD RIGHT HAND  Final   Special Requests BOTTLES DRAWN AEROBIC ONLY  3CC  Final   Culture NO GROWTH 2 DAYS  Final   Report Status PENDING  Incomplete    RADIOLOGY STUDIES/RESULTS: Dg Chest Port 1 View  08/26/2015  CLINICAL DATA:  Status post right PICC placement today. EXAM: PORTABLE CHEST 1 VIEW COMPARISON:  Single view of the chest 08/25/2015. FINDINGS: Right PICC is in place with the tip projecting over the lower superior vena cava. Tracheostomy tube is again seen. Airspace disease in the right mid and lower lung zones persists. Aeration in the right base appears slightly improved. The left lung is clear. Heart size is normal. No pneumothorax. IMPRESSION: Tip of right PICC projects over the lower superior vena cava. Persistent airspace disease in the right shows mild improvement in the lung base. Electronically Signed   By: Inge Rise M.D.   On: 08/26/2015 12:45   Dg Chest Port 1 View  08/25/2015  CLINICAL DATA:  Fever. EXAM: PORTABLE CHEST 1 VIEW COMPARISON:  July 27, 2015. FINDINGS: Tracheostomy is in grossly good position. Cardiomediastinal silhouette appears normal. No pneumothorax or significant pleural effusion is noted. Left lung is clear. Right midlung and basilar opacity is noted concerning for pneumonia. Bony thorax is unremarkable. IMPRESSION: Right-sided pneumonia is noted.  Tracheostomy is in good position. Electronically Signed   By: Marijo Conception, M.D.   On: 08/25/2015 09:36    Oren Binet, MD  [facility] Pager:[phone]  If 7PM-7AM, please contact night-coverage [email or web address] Password [email or web address] 09/01/2015, 10:45 AM   LOS: 7 days

## 2015-09-01 NOTE — Progress Notes (Signed)
[facility]    Date of Admission:  08/25/2015   Total days of antibiotics 8        Day 3 aztreonam           ID: Jason [name] is a 29 y.o. male with paraplegia, sacral decub/osteomyelitis admitted for fever, altered mental status Principal Problem:   SIRS (systemic inflammatory response syndrome) ([place]) Active Problems:   Gastroparesis due to DM ([name])   Neurogenic bladder   Acute encephalopathy   Suprapubic catheter ([place])   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 ([place])   HCAP (healthcare-associated pneumonia)   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([place])   Severe protein-calorie malnutrition ([place])   Dysphagia, pharyngoesophageal phase   Chronic anemia   Uncontrolled type 1 diabetes mellitus with hyperglycemia ([place])   Tracheostomy dependent ([name])   Acute on chronic respiratory failure ([name])   Hypomagnesemia   Decubitus ulcer of right perineal ischial region, stage 4 ([name])   Decubitus ulcer of left perineal ischial region, stage 4 ([name])   Decubitus ulcer of sacral region, stage 4 ([name])   Severe dehydration   Acute kidney injury ([place])   Severe sepsis ([name])   FUO (fever of unknown origin)   Eosinophilia    Subjective: Mother reports that Wah is more alert last night and this morning, though he complained of pruritis, thus received iv phenergan -> now asleep again  Interval history ; temp to 100.6 yesterday am, but remained afebrile since then. Wbc at 30K  Medications:  . antiseptic oral rinse  7 mL Mouth Rinse q12n4p  . baclofen  10 mg Oral BID AC  . baclofen  20 mg Oral QHS  . chlorhexidine  15 mL Mouth Rinse BID  . collagenase  1 application Topical Q M,W,F  . enoxaparin (LOVENOX) injection  40 mg Subcutaneous Q24H  . feeding supplement (GLUCERNA 1.2 CAL)  1,000 mL Per Tube Q24H  . fentaNYL  50 mcg Transdermal Q72H  . free water  200 mL Per Tube 3 times per day  . insulin aspart  0-20 Units Subcutaneous 6 times per day  .  insulin glargine  10 Units Subcutaneous Daily  . magnesium oxide  400 mg Oral BID  . midodrine  20 mg Per Tube TID WC  . multivitamin with minerals  1 tablet Per Tube Daily  . pantoprazole sodium  40 mg Per Tube Daily  . pregabalin  100 mg Per Tube TID AC  . pregabalin  200 mg Oral QHS  . saccharomyces boulardii  250 mg Oral BID  . sodium chloride  10-40 mL Intracatheter Q12H    Objective: Vital signs in last 24 hours: Temp:  [97.9 F (36.6 C)-99.5 F (37.5 C)] 99.5 F (37.5 C) (11/24 [id number]) Pulse Rate:  [79-98] 80 (11/24 1000) Resp:  [12-19] 13 (11/24 1000) BP: (84-138)/(57-94) 116/63 mmHg (11/24 1000) SpO2:  [95 %-98 %] 96 % (11/24 1000) Weight:  [115 lb 8.3 oz (52.4 kg)] 115 lb 8.3 oz (52.4 kg) (11/24 0500) Physical Exam  Constitutional: He is seleeping  He appears chronically ill. No distress.  HENT:  Mouth/Throat: Oropharynx is clear and dry. No oropharyngeal exudate. Trach in place Cardiovascular: Normal rate, regular rhythm and normal heart sounds. Exam reveals no gallop and no friction rub.  No murmur heard.  Pulmonary/Chest: Effort normal and breath sounds normal. No respiratory distress. He has no wheezes.  Neurological: flaccid lower extremities, with contracture Skin: Skin is warm and dry. Decub  ulcers are wrapped. Did not examin    Lab Results  Recent Labs  08/31/15 0429 09/01/15 0617  WBC 24.0* 30.9*  HGB 8.6* 8.9*  HCT 28.1* 28.2*  NA 130* 131*  K 5.2* 4.8  CL 96* 97*  CO2 26 28  BUN 28* 29*  CREATININE 1.44* 1.31*    Microbiology: 11/22 blood cx repeated Studies/Results: No results found.   Assessment/Plan: Hallucinations/encephalopathy = still probably related to carbapenem exposure. Improved overall  Eosinophilia = now resolved, possibly due to carbapenem exposure  Fevers = will continue to monitor, treat symptoms. Has pending blood cx from 11/22. Afebrile now x 24h  HCAP/ aspiration pna = finished tx course. No longer needs abtx  aki  =  improving. Continue with current course and see if it improves with tomorrow's labs  Sacral decub/osteo =  Continue with wound care.    Elzie Rings [facility] [phone]   09/01/2015, 11:41 AM

## 2015-09-02 LAB — BASIC METABOLIC PANEL
ANION GAP: 4 — AB (ref 5–15)
BUN: 29 mg/dL — ABNORMAL HIGH (ref 6–20)
CHLORIDE: 97 mmol/L — AB (ref 101–111)
CO2: 30 mmol/L (ref 22–32)
CREATININE: 1.25 mg/dL — AB (ref 0.61–1.24)
Calcium: 10.5 mg/dL — ABNORMAL HIGH (ref 8.9–10.3)
GFR calc non Af Amer: >60 mL/min (ref >60)
Glucose, Bld: 164 mg/dL — ABNORMAL HIGH (ref 65–99)
POTASSIUM: 5 mmol/L (ref 3.5–5.1)
SODIUM: 131 mmol/L — AB (ref 135–145)

## 2015-09-02 LAB — CBC
HCT: 28.6 % — ABNORMAL LOW (ref 39.0–52.0)
HEMOGLOBIN: 8.7 g/dL — AB (ref 13.0–17.0)
MCH: 25.9 pg — ABNORMAL LOW (ref 26.0–34.0)
MCHC: 30.4 g/dL (ref 30.0–36.0)
MCV: 85.1 fL (ref 78.0–100.0)
Platelets: 603 10*3/uL — ABNORMAL HIGH (ref 150–400)
RBC: 3.36 MIL/uL — ABNORMAL LOW (ref 4.22–5.81)
RDW: 14.9 % (ref 11.5–15.5)
WBC: 25.1 10*3/uL — ABNORMAL HIGH (ref 4.0–10.5)

## 2015-09-02 LAB — GLUCOSE, CAPILLARY
GLUCOSE-CAPILLARY: 195 mg/dL — AB (ref 65–99)
GLUCOSE-CAPILLARY: 163 mg/dL — AB (ref 65–99)
Glucose-Capillary: 115 mg/dL — ABNORMAL HIGH (ref 65–99)
Glucose-Capillary: 273 mg/dL — ABNORMAL HIGH (ref 65–99)

## 2015-09-02 MED ORDER — METRONIDAZOLE 500 MG PO TABS: 500.0000 mg | ORAL_TABLET | Freq: Two times a day (BID) | ORAL | Status: DC

## 2015-09-02 MED ORDER — VANCOMYCIN 50 MG/ML ORAL SOLUTION: 125.0000 mg | Freq: Four times a day (QID) | ORAL | Status: DC

## 2015-09-02 MED ORDER — COLLAGENASE 250 UNIT/GM EX OINT: 1.0000 "application " | TOPICAL_OINTMENT | CUTANEOUS | Status: DC

## 2015-09-02 MED ORDER — FREE WATER: 200.0000 mL | Freq: Three times a day (TID) | Status: DC

## 2015-09-02 MED ORDER — CAMPHOR-MENTHOL 0.5-0.5 % EX LOTN: TOPICAL_LOTION | CUTANEOUS | Status: AC | PRN

## 2015-09-02 MED ORDER — DIPHENHYDRAMINE HCL 12.5 MG/5ML PO ELIX
12.5000 mg | ORAL_SOLUTION | Freq: Four times a day (QID) | ORAL | Status: DC | PRN
  Administered 2015-09-02: 12.5 mg
  Filled 2015-09-02 (×2): qty 5

## 2015-09-02 MED ORDER — CAMPHOR-MENTHOL 0.5-0.5 % EX LOTN
TOPICAL_LOTION | CUTANEOUS | Status: DC | PRN
  Filled 2015-09-02: qty 222

## 2015-09-02 MED ORDER — VANCOMYCIN HCL 125 MG PO CAPS
125.0000 mg | ORAL_CAPSULE | Freq: Four times a day (QID) | ORAL | Status: DC
Start: 2015-09-02 — End: 2015-09-02

## 2015-09-02 MED ORDER — CIPROFLOXACIN HCL 500 MG PO TABS: 500.0000 mg | ORAL_TABLET | Freq: Two times a day (BID) | ORAL | Status: DC

## 2015-09-02 NOTE — Discharge Summary (Addendum)
PATIENT DETAILS Name: Jason [name] Age: 29 y.o. Sex: male Date of Birth: 26-Sep-1986 MRN: [id number]. Admitting Physician: Caren Griffins, MD PCP:FRY,STEPHEN A, MD  Admit Date: 08/25/2015 Discharge date: 09/02/2015  Recommendations for Outpatient Follow-up:  1. Please ensure follow up with ID 2. Please repeat CBC/BMET 1 week 3. Please follow blood cultures till final 4. Urine eosinophils pending-please follow  PRIMARY DISCHARGE DIAGNOSIS:  Principal Problem:   SIRS (systemic inflammatory response syndrome) ([name]) Active Problems:   Gastroparesis due to DM ([name])   Neurogenic bladder   Acute encephalopathy   Suprapubic catheter ([name])   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 ([name])   HCAP (healthcare-associated pneumonia)   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([name])   Severe protein-calorie malnutrition ([name])   Dysphagia, pharyngoesophageal phase   Chronic anemia   Uncontrolled type 1 diabetes mellitus with hyperglycemia ([name])   Tracheostomy dependent ([name])   Acute on chronic respiratory failure ([name])   Hypomagnesemia   Decubitus ulcer of right perineal ischial region, stage 4 ([name])   Decubitus ulcer of left perineal ischial region, stage 4 ([name])   Decubitus ulcer of sacral region, stage 4 ([name])   Severe dehydration   Acute kidney injury ([place])   Severe sepsis ([name])   FUO (fever of unknown origin)   Eosinophilia      PAST MEDICAL HISTORY: Past Medical History  Diagnosis Date  . GERD (gastroesophageal reflux disease)   . Asthma   . Hx MRSA infection     on face  . Gastroparesis   . Diabetic neuropathy ([place])   . Seizures ([place])   . Family history of anesthesia complication     Pt mother can't have epidural procedures  . Dysrhythmia   . Pneumonia   . Arthritis   . Fibromyalgia   . Stroke ([place]) 01/29/2014    spinal stroke ; "quadriplegic since"  . Type I diabetes mellitus ([place])     sees Dr. Loanne Drilling   . Syncope 02/16/2015    DISCHARGE  MEDICATIONS: Current Discharge Medication List    START taking these medications   Details  camphor-menthol (SARNA) lotion Apply topically as needed for itching. Qty: 222 mL, Refills: 0    ciprofloxacin (CIPRO) 500 MG tablet Take 1 tablet (500 mg total) by mouth 2 (two) times daily. Qty: 28 tablet, Refills: 0    metroNIDAZOLE (FLAGYL) 500 MG tablet Take 1 tablet (500 mg total) by mouth 2 (two) times daily. Qty: 56 tablet, Refills: 0    Water For Irrigation, Sterile (FREE WATER) SOLN Place 200 mLs into feeding tube every 8 (eight) hours. Qty: 10000 mL, Refills: 0      CONTINUE these medications which have CHANGED   Details  collagenase (SANTYL) ointment Apply 1 application topically every Monday, Wednesday, and Friday. Apply to necrotic tissue in the sacral wound only. Qty: 90 g, Refills: 0      CONTINUE these medications which have NOT CHANGED   Details  acetaminophen (TYLENOL) 325 MG tablet Take 2 tablets (650 mg total) by mouth every 6 (six) hours as needed for mild pain, moderate pain, fever or headache.    albuterol (PROVENTIL) (2.5 MG/3ML) 0.083% nebulizer solution Take 3 mLs (2.5 mg total) by nebulization every 4 (four) hours as needed for wheezing or shortness of breath. Qty: 75 mL, Refills: 3    baclofen (LIORESAL) 10 MG tablet Take 1-2 tablets (10-20 mg total) by mouth 3 (three) times daily. Takes 106m in morning and lunchtime and takes 293mat  bedtime Qty: 120 each, Refills: 5   Associated Diagnoses: Spinal cord infarction ([place]); Type 1 diabetes, uncontrolled, with neuropathy ([place]); Sacral pressure sore, unspecified pressure ulcer stage; Tetraplegia ([name])    fentaNYL (DURAGESIC - DOSED MCG/HR) 50 MCG/HR Place 1 patch (50 mcg total) onto the skin every 3 (three) days. Qty: 10 patch, Refills: 0    ferrous sulfate 325 (65 FE) MG tablet Take 1 tablet (325 mg total) by mouth daily with breakfast. Qty: 30 tablet, Refills: 0    GLUCERNA (GLUCERNA) LIQD 1,000 mLs by PEG  Tube route continuous. 70 ml/hr    insulin aspart (NOVOLOG FLEXPEN) 100 UNIT/ML FlexPen Inject 0-10 Units into the skin 4 (four) times daily as needed for high blood sugar (CBG >200). Per sliding scale: CBG <200 0 units, 201-300 4 units, 301-400 6 units, >400 10 units    insulin glargine (LANTUS) 100 UNIT/ML injection Inject 0.15 mLs (15 Units total) into the skin at bedtime. Qty: 10 mL, Refills: 11    LORazepam (ATIVAN) 0.5 MG tablet Take 0.5 mg by mouth 3 (three) times daily as needed for anxiety.    Magnesium Oxide 400 MG CAPS Take 1 capsule (400 mg total) by mouth 2 (two) times daily. Qty: 60 capsule, Refills: 0    midodrine (PROAMATINE) 10 MG tablet Take 2 tablets (20 mg total) by mouth 3 (three) times daily with meals. Qty: 180 tablet, Refills: 6    Multiple Vitamin (MULTIVITAMIN WITH MINERALS) TABS tablet Place 1 tablet into feeding tube daily.    ondansetron (ZOFRAN ODT) 8 MG disintegrating tablet Place 1 tablet (8 mg total) into feeding tube every 8 (eight) hours as needed for nausea or vomiting. Qty: 60 tablet, Refills: 5    Oxycodone HCl 20 MG TABS Take 1 tablet (20 mg total) by mouth every 6 (six) hours as needed. With dressing changes and for break through pain but NO MORE THAN 4 PER DAY Qty: 120 tablet, Refills: 0   Associated Diagnoses: Sacral decubitus ulcer, unspecified pressure ulcer stage; Osteomyelitis, pelvic region and thigh ([name])    pantoprazole sodium (PROTONIX) 40 mg/20 mL PACK Place 20 mLs (40 mg total) into feeding tube daily. Qty: 30 each, Refills: 6    !! pregabalin (LYRICA) 100 MG capsule Place 1 capsule (100 mg total) into feeding tube 3 (three) times daily. Take 100 mg by mouth in the morning, take 100 mg by mouth in the afternoon, take 100 mg by mouth in the evening and take 200 mg by mouth at bedtime. Qty: 150 capsule, Refills: 5   Associated Diagnoses: Spinal cord infarction ([place]); Type 1 diabetes, uncontrolled, with neuropathy ([place]); Sacral pressure  sore, unspecified pressure ulcer stage; Tetraplegia [facility])    !! pregabalin (LYRICA) 200 MG capsule Take 200 mg by mouth at bedtime.    saccharomyces boulardii (FLORASTOR) 250 MG capsule Take 1 capsule (250 mg total) by mouth 2 (two) times daily. Qty: 60 capsule, Refills: 3    ACCU-CHEK SMARTVIEW test strip Use to check blood sugar 4 times daily. Qty: 200 each, Refills: 0    glucagon (GLUCAGON EMERGENCY) 1 MG injection INJECT INTO MUSCLE ONCE AS NEEDED Qty: 1 kit, Refills: 0    Insulin Syringes, Disposable, U-100 1 ML MISC Dispense 29G x 1/2 inch, use as directed, diagnosis code E 10.41 Qty: 100 each, Refills: 1    mirtazapine (REMERON) 7.5 MG tablet Place 1 tablet (7.5 mg total) into feeding tube at bedtime. Qty: 30 tablet, Refills: 6     !! -  Potential duplicate medications found. Please discuss with provider.    STOP taking these medications     colistimethate 75 mg in sodium chloride 0.9 % 100 mL      doxycycline (VIBRA-TABS) 100 MG tablet      Emollient (COLLAGEN EX)      insulin aspart (NOVOLOG) 100 UNIT/ML injection         ALLERGIES:   Allergies  Allergen Reactions  . Cefuroxime Axetil Anaphylaxis  . Ertapenem Other (See Comments)    Rash and confusion-->tolerated Imipenem   . Morphine And Related Other (See Comments)    Changed mental status, confusion, headache, visual hallucination  . Penicillins Anaphylaxis and Other (See Comments)    Tolerated Imipenem; no reaction to 7 day course of amoxicillin in 2015 Has patient had a PCN reaction causing immediate rash, facial/tongue/throat swelling, SOB or lightheadedness with hypotension: Yes Has patient had a PCN reaction causing severe rash involving mucus membranes or skin necrosis: No Has patient had a PCN reaction that required hospitalization Yes Has patient had a PCN reaction occurring within the last 10 years: No If all of the above answers are "NO", then may proceed with Cephalo  . Sulfa Antibiotics  Anaphylaxis, Shortness Of Breath and Other (See Comments)  . Tessalon [Benzonatate] Anaphylaxis  . Shellfish Allergy Itching and Other (See Comments)    Took benadryl to alleviate reaction  . Nsaids Other (See Comments)    Risk of bleeding  . Miripirium Rash and Other (See Comments)    Change in mental status    BRIEF HPI:  See H&P, Labs, Consult and Test reports for all details in brief,Makoto W Lindamood is a 29 y.o. 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 Initial workup revealed possible HCAP on CXR-patient was started on Vancomycin and Imipenem and admitted to the hospital for further evaluation and treatment.   CONSULTATIONS:   ID  PERTINENT RADIOLOGIC STUDIES: Dg Chest Port 1 View  08/26/2015  CLINICAL DATA:  Status post right PICC placement today. EXAM: PORTABLE CHEST 1 VIEW COMPARISON:  Single view of the chest 08/25/2015. FINDINGS: Right PICC is in place with the tip projecting over the lower superior vena cava. Tracheostomy tube is again seen. Airspace disease in the right mid and lower lung zones persists. Aeration in the right base appears slightly improved. The left lung is clear. Heart size is normal. No pneumothorax. IMPRESSION: Tip of right PICC projects over the lower superior vena cava. Persistent airspace disease in the right shows mild improvement in the lung base. Electronically Signed   By: Inge Rise M.D.   On: 08/26/2015 12:45   Dg Chest Port 1 View  08/25/2015  CLINICAL DATA:  Fever. EXAM:  PORTABLE CHEST 1 VIEW COMPARISON:  July 27, 2015. FINDINGS: Tracheostomy is in grossly good position. Cardiomediastinal silhouette appears normal. No pneumothorax or significant pleural effusion is noted. Left lung is clear. Right midlung and basilar opacity is noted concerning for pneumonia. Bony thorax is unremarkable. IMPRESSION: Right-sided pneumonia is noted.  Tracheostomy is in good position. Electronically Signed   By: Jeneen Rinks  Murlean Caller, M.D.   On: 08/25/2015 09:36     PERTINENT LAB RESULTS: CBC:  Recent Labs  09/01/15 0617 09/02/15 0400  WBC 30.9* 25.1*  HGB 8.9* 8.7*  HCT 28.2* 28.6*  PLT 586* 603*   CMET CMP     Component Value Date/Time   NA 131* 09/02/2015 0400   K 5.0 09/02/2015 0400   CL 97* 09/02/2015 0400   CO2 30 09/02/2015 0400   GLUCOSE 164* 09/02/2015 0400   BUN 29* 09/02/2015 0400   CREATININE 1.25* 09/02/2015 0400   CALCIUM 10.5* 09/02/2015 0400   PROT 7.3 08/31/2015 0429   ALBUMIN 2.0* 08/31/2015 0429   AST 15 08/31/2015 0429   ALT 9* 08/31/2015 0429   ALKPHOS 95 08/31/2015 0429   BILITOT 0.4 08/31/2015 0429   GFRNONAA >60 09/02/2015 0400   GFRAA >60 09/02/2015 0400    GFR Estimated Creatinine Clearance: 64.3 mL/min (by C-G formula based on Cr of 1.25). No results for input(s): LIPASE, AMYLASE in the last 72 hours. No results for input(s): CKTOTAL, CKMB, CKMBINDEX, TROPONINI in the last 72 hours. Invalid input(s): POCBNP No results for input(s): DDIMER in the last 72 hours. No results for input(s): HGBA1C in the last 72 hours. No results for input(s): CHOL, HDL, LDLCALC, TRIG, CHOLHDL, LDLDIRECT in the last 72 hours. No results for input(s): TSH, T4TOTAL, T3FREE, THYROIDAB in the last 72 hours.  Invalid input(s): FREET3 No results for input(s): VITAMINB12, FOLATE, FERRITIN, TIBC, IRON, RETICCTPCT in the last 72 hours. Coags: No results for input(s): INR in the last 72 hours.  Invalid input(s): PT Microbiology: Recent Results (from the  past 240 hour(s))  Blood Culture (routine x 2)     Status: None   Collection Time: 08/25/15  8:51 AM  Result Value Ref Range Status   Specimen Description BLOOD RIGHT ARM  Final   Special Requests BOTTLES DRAWN AEROBIC AND ANAEROBIC 5CC  Final   Culture NO GROWTH 5 DAYS  Final   Report Status 08/30/2015 FINAL  Final  Blood Culture (routine x 2)     Status: None   Collection Time: 08/25/15  8:56 AM  Result Value Ref Range Status   Specimen Description BLOOD LEFT WRIST  Final   Special Requests BOTTLES DRAWN AEROBIC AND ANAEROBIC 5CC  Final   Culture NO GROWTH 5 DAYS  Final   Report Status 08/30/2015 FINAL  Final  Urine culture     Status: None   Collection Time: 08/25/15 11:45 AM  Result Value Ref Range Status   Specimen Description URINE, CATHETERIZED  Final   Special Requests NONE  Final   Culture MULTIPLE SPECIES PRESENT, SUGGEST RECOLLECTION  Final   Report Status 08/26/2015 FINAL  Final  MRSA PCR Screening     Status: None   Collection Time: 08/25/15  2:23 PM  Result Value Ref Range Status   MRSA by PCR NEGATIVE NEGATIVE Final    Comment:        The GeneXpert MRSA Assay (FDA approved for NASAL specimens only), is one component of a comprehensive MRSA colonization surveillance program. It is not intended to diagnose MRSA infection nor to guide or monitor treatment for MRSA infections.   C difficile quick scan w PCR reflex     Status: Abnormal   Collection Time: 08/25/15 11:20 PM  Result Value Ref Range Status   C Diff antigen POSITIVE (A) NEGATIVE Final   C Diff toxin NEGATIVE NEGATIVE Final   C Diff interpretation   Final  C. difficile present, but toxin not detected. This indicates colonization. In most cases, this does not require treatment. If patient has signs and symptoms consistent with colitis, consider treatment. Requires ENTERIC precautions.  Respiratory virus antigens panel     Status: None   Collection Time: 08/25/15 11:20 PM  Result Value Ref Range  Status   Source - RVPAN NASOPHARYNGEAL  Corrected   Respiratory Syncytial Virus A Negative Negative Final   Respiratory Syncytial Virus B Negative Negative Final   Influenza A Negative Negative Final   Influenza B Negative Negative Final   Parainfluenza 1 Negative Negative Final   Parainfluenza 2 Negative Negative Final   Parainfluenza 3 Negative Negative Final   Metapneumovirus Negative Negative Final   Rhinovirus Negative Negative Final   Adenovirus Negative Negative Final    Comment: (NOTE) Performed At: [facility] [address] [facility] [employer] [facility], [place] [id number] Lindon Romp MD ES:[id number]   Clostridium Difficile by PCR     Status: None   Collection Time: 08/25/15 11:20 PM  Result Value Ref Range Status   Toxigenic C Difficile by pcr NEGATIVE NEGATIVE Final  Culture, blood (routine x 2)     Status: None (Preliminary result)   Collection Time: 08/30/15  2:54 PM  Result Value Ref Range Status   Specimen Description BLOOD LEFT HAND  Final   Special Requests IN PEDIATRIC BOTTLE  2CC  Final   Culture NO GROWTH 2 DAYS  Final   Report Status PENDING  Incomplete  Culture, blood (routine x 2)     Status: None (Preliminary result)   Collection Time: 08/30/15  3:05 PM  Result Value Ref Range Status   Specimen Description BLOOD RIGHT HAND  Final   Special Requests BOTTLES DRAWN AEROBIC ONLY  3CC  Final   Culture NO GROWTH 2 DAYS  Final   Report Status PENDING  Incomplete     BRIEF HOSPITAL COURSE:  Brief Narrative: MEGAN HAYDUK is a 29 y.o. 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 Initial workup revealed possible HCAP on CXR-patient was started on Vancomycin and Imipenem and admitted to the hospital for further evaluation and treatment. Patient was seen by infectious disease, antibiotics were narrowed down to just imipenem-unfortunately hospital course has been complicated by development of confusion/acute renal failure/fever-that is currently thought to be secondary to drug reaction from imipenem. Patient was then switched to Aztreonam-with complete resolution of confusion and improvement in renal function. Since patient completed 8 days of IV antibiotics-all antibiotics were discontinued on 11/24, patient was monitored one additional day-leukocytosis and renal function continued to improve. He remains completely awake and alert. After discussion with infectious disease-recommendations are to discharge on ciprofloxacin for [redacted] weeks along with 4 weeks of oral vancomycin prophylactically.   Hospital Course by Problem List: Sepsis secondary to HCAP: likely secondary to HCAP-blood cultures negative so far, urine culture shows multiple bacterial morphotypes.Influenza PCR/Resp virus panel neg. Urine Strep Pneumonia/Legionella Ag negative. Infectious disease was consulted during this hospital course, antibiotics initially narrowed to just Primaxin-but developed encephalopathy/worsening renal function/fever. Per mother-patient has had similar  reactions with ertapenem in the past-since Drug reaction was suspected-patient was switched to Aztreonam with complete resolution of encephalopathy and downtrending leukocytosis and creatinine level. Since patient has completed 8 days of antibiotics for HCAP-after discussion with infectious disease, all antibiotics were discontinued on 11/24-he continues to improve and is now requesting discharge. After discussion with infectious disease (Dr Baxter Flattery), recommendations are to  start ciprofloxacin (tissue culture on 10/27 from left sacral wound positive for E Coli) for a total of weeks along with 4 weeks of prophylactic oral vancomycin. Patient has a infectious disease follow-up appointment on 12/6 which she has been asked to keep. Please note-patient is very complicated with multiple allergies and now has developed multi drug resistant organisms-mother is aware of this difficult situation.  Addendum 11: 00 am Per CM-Vancomycin not covered by insurance-after d/w ID-Dr Snider-will change to oral Flagyl BID.   Active Problems: Acute encephalopathy: Encephalopathic on admission-but had resolved-only to recur on 11/21 pm-felt to be secondary to Primaxin. As noted above per patients mother-patient does have history of similar symptoms with ertapenem. Primaxin was stopped on 11/22, Aztreonam was subsequently started-now patient is completely awake and alert.   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 Supportive care.  Acute renal failure: Likely secondary to prerenal azotemia-initially resolved with IV fluids-but recurred-suspicion for AIN-given fever/confusion etc-creatinine now improving-after Primaxin discontinued. Urine Eosinophils pending-please follow.   Leukocytosis: Suspect has some chronic leukocytosis due to sick for decubitus ulcers/chronic inflammation-but now downtrending-please repeat CBC in 1 week. See above regarding Abx  Hyponatremia:? Excessive fluid intake-euvolemic on exam-decrease IV fluids and follow. Has some mild chronic hyponatremia at baseline.  Dysphagia: seen by speech therapy-started on a regular diet.   History of gastroparesis: Likely secondary to diabetes, no vomiting-seem stable at present. Follow, if needed- start Reglan.  Type 1 diabetes: CBGs better controlled-continue Lantus to 15 units and continue with SSI. Follow and adjust accordingly  History of neurogenic bladder: Supra pubic catheter  in place.  Severe protein calorie malnutrition: On nocturnal tube feedings, nutrition consulted-during his stay here  Stage IV sacral ulcers/stage III left heel ulcer/Full thickness wound mid back:present prior to admission, seen by wound care RN-not felt to be the source of sepsis. See above regarding Abx, and please see wound care instructions. Note-wound vac started this hospital course  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. IVC filter placed 8/6. Not on A/C due to need for frequent debridements which have caused extensive bleeding, previously on apixaban-on prophylactic Lovenox during this hospital course.   Chronic anemia, likely due to chronic disease: Hemoglobin stable-follow closely in the outpatient setting.  History of spinal cord infarct with paraplegia, partial quadriplegia-home health services on discharge  Chronic pain syndrome: Secondary to extensive sacral ulcers-continue transdermal fentanyl/oxycodone.  TODAY-DAY OF DISCHARGE:  Subjective:   [facility] today has no headache,no chest abdominal pain,no new weakness tingling or numbness, feels much better wants to go home today.  Objective:   Blood pressure 117/71, pulse 86, temperature 97.3 F (36.3 C), temperature source Axillary, resp. rate 12, height $RemoveBe'5\' 8"'WOsEyVITx$  (1.727 m), weight 53.1 kg (117 lb 1 oz), SpO2 95 %.  Intake/Output Summary (Last 24 hours) at 09/02/15 1253 Last data filed at 09/02/15 0900  Gross per 24 hour  Intake   1850 ml  Output   4400 ml  Net  -2550 ml   Filed Weights   08/31/15 0500 09/01/15 0500 09/02/15 0422  Weight: 54.7 kg (120 lb 9.5 oz) 52.4 kg (115 lb 8.3 oz)  53.1 kg (117 lb 1 oz)    Exam Awake Alert, Oriented *3, No new F.N deficits, Normal affect [place].AT,PERRAL Supple Neck,No JVD, No cervical lymphadenopathy appriciated.  Symmetrical Chest wall movement, Good air movement bilaterally, CTAB RRR,No Gallops,Rubs or new Murmurs, No Parasternal Heave +ve B.Sounds, Abd Soft, Non tender, No  organomegaly appriciated, No rebound -guarding or rigidity. No Cyanosis, Clubbing or edema, No new Rash or bruise  DISCHARGE CONDITION: Stable  DISPOSITION: Home with home health services  DISCHARGE INSTRUCTIONS:    Activity:  As tolerated with Full fall precautions use walker/cane & assistance as needed  Get Medicines reviewed and adjusted: Please take all your medications with you for your next visit with your Primary MD  Please request your Primary MD to go over all hospital tests and procedure/radiological results at the follow up, please ask your Primary MD to get all Hospital records sent to his/her office.  If you experience worsening of your admission symptoms, develop shortness of breath, life threatening emergency, suicidal or homicidal thoughts you must seek medical attention immediately by calling 911 or calling your MD immediately  if symptoms less severe.  You must read complete instructions/literature along with all the possible adverse reactions/side effects for all the Medicines you take and that have been prescribed to you. Take any new Medicines after you have completely understood and accpet all the possible adverse reactions/side effects.   Do not drive when taking Pain medications.   Do not take more than prescribed Pain, Sleep and Anxiety Medications  Special Instructions: If you have smoked or chewed Tobacco  in the last 2 yrs please stop smoking, stop any regular Alcohol  and or any Recreational drug use.  Wear Seat belts while driving.  Please note  You were cared for by a hospitalist during your hospital stay. Once you are discharged, your primary care physician will handle any further medical issues. Please note that NO REFILLS for any discharge medications will be authorized once you are discharged, as it is imperative that you return to your primary care physician (or establish a relationship with a primary care physician if you do not have one) for your  aftercare needs so that they can reassess your need for medications and monitor your lab values.   Diet recommendation: Regular Diet  Discharge Instructions    Call MD for:  redness, tenderness, or signs of infection (pain, swelling, redness, odor or green/yellow discharge around incision site)    Complete by:  As directed      Call MD for:  temperature >100.4    Complete by:  As directed      Diet - low sodium heart healthy    Complete by:  As directed      Discharge wound care:    Complete by:  As directed   Wound VAC: 1. Cleanse wounds with normal saline 2.  Apply skin prep to the periwound skin 3. Protect intact skin between the wounds with VAC drape 4. Black foam to fill each wound, black foam to bridge the 3 wounds together  (May need to use hydrocolloid strip at the distal edge of the wound above rectum to maintain seal of VAC dressing). 5. Seal entire site at 147mHG.  6.Change M/W/F   Lower Ext wounds: 1.  Silicone foam to the left heel and left dorsal foot wound, change every 3 days and PRN soilage.  2.  Cover back wounds with xeroform gauze, top with dry gauze. Change M/W/F. 3. Prevalon boots  at all times     Increase activity slowly    Complete by:  As directed            Follow-up Information    Follow up with Scharlene Gloss, MD On 09/13/2015.   Specialty:  Infectious Diseases   Why:  Hospital follow up-appt at 11 am   Contact information:   [address] [address] [phone]       Follow up with [name] A, MD. Schedule an appointment as soon as possible for a visit in 1 week.   Specialty:  Family Medicine   Contact information:   [place] [address] [phone]      Total Time spent on discharge equals  45 minutes.  SignedOren Binet 09/02/2015 12:53 PM

## 2015-09-02 NOTE — Progress Notes (Signed)
[place] for Infectious Disease    Date of Admission:  08/25/2015   Total days of antibiotics 8        ---none---           ID: Jason [name] is a 29 y.o. male with paraplegia, sacral decub/osteomyelitis admitted for fever, altered mental status Principal Problem:   SIRS (systemic inflammatory response syndrome) ([name]) Active Problems:   Gastroparesis due to DM ([name])   Neurogenic bladder   Acute encephalopathy   Suprapubic catheter ([place])   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 ([place])   HCAP (healthcare-associated pneumonia)   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([place])   Severe protein-calorie malnutrition ([place])   Dysphagia, pharyngoesophageal phase   Chronic anemia   Uncontrolled type 1 diabetes mellitus with hyperglycemia ([name])   Tracheostomy dependent ([name])   Acute on chronic respiratory failure ([name])   Hypomagnesemia   Decubitus ulcer of right perineal ischial region, stage 4 ([name])   Decubitus ulcer of left perineal ischial region, stage 4 ([name])   Decubitus ulcer of sacral region, stage 4 ([name])   Severe dehydration   Acute kidney injury ([place])   Severe sepsis ([name])   FUO (fever of unknown origin)   Eosinophilia    Subjective: Appears more alert  Interval history ; afebrile x 36h and wbc down to 25K  Medications:  . antiseptic oral rinse  7 mL Mouth Rinse q12n4p  . baclofen  10 mg Oral BID AC  . baclofen  20 mg Oral QHS  . chlorhexidine  15 mL Mouth Rinse BID  . collagenase  1 application Topical Q M,W,F  . enoxaparin (LOVENOX) injection  40 mg Subcutaneous Q24H  . feeding supplement (GLUCERNA 1.2 CAL)  1,000 mL Per Tube Q24H  . fentaNYL  50 mcg Transdermal Q72H  . free water  200 mL Per Tube 3 times per day  . insulin aspart  0-20 Units Subcutaneous 6 times per day  . insulin glargine  10 Units Subcutaneous Daily  . magnesium oxide  400 mg Oral BID  . midodrine  20 mg Per Tube TID WC  . multivitamin with minerals  1 tablet Per Tube Daily    . pantoprazole sodium  40 mg Per Tube Daily  . pregabalin  100 mg Per Tube TID AC  . pregabalin  200 mg Oral QHS  . saccharomyces boulardii  250 mg Oral BID  . sodium chloride  10-40 mL Intracatheter Q12H    Objective: Vital signs in last 24 hours: Temp:  [97.5 F (36.4 C)-100 F (37.8 C)] 99 F (37.2 C) (11/25 0734) Pulse Rate:  [75-83] 77 (11/25 0734) Resp:  [10-17] 10 (11/25 0734) BP: (98-151)/(69-96) 100/76 mmHg (11/25 0734) SpO2:  [98 %-99 %] 99 % (11/25 0734) Weight:  [117 lb 1 oz (53.1 kg)] 117 lb 1 oz (53.1 kg) (11/25 0422) Physical Exam  Constitutional: He is alert. He appears chronically ill. No distress.  HENT:  Mouth/Throat: Oropharynx is clear and dry. No oropharyngeal exudate. Trach in place Cardiovascular: Normal rate, regular rhythm and normal heart sounds. Exam reveals no gallop and no friction rub.  No murmur heard.  Pulmonary/Chest: Effort normal and breath sounds normal. No respiratory distress. He has no wheezes.  Neurological: flaccid lower extremities, with contracture Skin: Skin is warm and dry. Decub ulcers are wrapped. Did not examin    Lab Results  Recent Labs  09/01/15 0617 09/02/15 0400  WBC 30.9* 25.1*  HGB 8.9* 8.7*  HCT  28.2* 28.6*  NA 131* 131*  K 4.8 5.0  CL 97* 97*  CO2 28 30  BUN 29* 29*  CREATININE 1.31* 1.25*    Microbiology: 11/22 blood cx repeated Studies/Results: No results found.   Assessment/Plan: Hallucinations/encephalopathy = improved. Likely due to imipenem  Eosinophilia = now resolved, possibly due to carbapenem exposure  Fevers = resolved, possibly drug induced (carbapenems).  blood cx from 11/22 NGTD. Afebrile now x 2 days  HCAP/ aspiration pna = finished tx course. No longer needs abtx  aki =  Improving.  Sacral decub/osteo =  Continue with wound care. Previously had been on doxycyclinc for + MRSA in July cx. Patient had become increasingly encephalopathic and was asked to stop it prior to admit,  though it is unclear if the doxy caused the encephalopathy vs. New infection with HCAP. It is worth reinitiation as an outpatient. He has upcoming ID appt with dr Linus Salmons on Dec 6ht.   due to his leukocytosis of 30k, and cdifficile ruled out, I suspect that his sacral wound and osteo are the cause of his leukocytosis. Tissue Cx in late Oct isolated pan-sensitive ecoli, will do 2 wk course of cipro tx (since other abtx pt has drug allergy) to see if it will improve.  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 We will give concominant empiric tx with metronidazole 500mg  BID x 4 wk with cipro. Can reassess at upcoming out patient visit. Will try to get pre approval through the ID office for oral vanco   Jason [name] for Infectious Diseases [phone]   09/02/2015, 10:25 AM

## 2015-09-04 LAB — CULTURE, BLOOD (ROUTINE X 2)
CULTURE: NO GROWTH
Culture: NO GROWTH

## 2015-09-06 ENCOUNTER — Encounter: Payer: Self-pay | Admitting: Internal Medicine

## 2015-09-07 ENCOUNTER — Telehealth: Payer: Self-pay | Admitting: Family Medicine

## 2015-09-07 ENCOUNTER — Ambulatory Visit (HOSPITAL_COMMUNITY)
Admission: RE | Admit: 2015-09-07 | Discharge: 2015-09-07 | Disposition: A | Discharge status: Home / Self Care | Payer: Medicaid Other | Source: Ambulatory Visit | Attending: Internal Medicine | Admitting: Internal Medicine

## 2015-09-07 ENCOUNTER — Other Ambulatory Visit: Payer: Self-pay | Admitting: Physical Medicine & Rehabilitation

## 2015-09-07 DIAGNOSIS — Z930 Tracheostomy status: Secondary | ICD-10-CM | POA: Diagnosis not present

## 2015-09-07 DIAGNOSIS — Z993 Dependence on wheelchair: Secondary | ICD-10-CM | POA: Diagnosis not present

## 2015-09-07 DIAGNOSIS — Z09 Encounter for follow-up examination after completed treatment for conditions other than malignant neoplasm: Secondary | ICD-10-CM | POA: Insufficient documentation

## 2015-09-07 DIAGNOSIS — Z9911 Dependence on respirator [ventilator] status: Secondary | ICD-10-CM | POA: Insufficient documentation

## 2015-09-07 DIAGNOSIS — E1040 Type 1 diabetes mellitus with diabetic neuropathy, unspecified: Secondary | ICD-10-CM | POA: Insufficient documentation

## 2015-09-07 DIAGNOSIS — A4902 Methicillin resistant Staphylococcus aureus infection, unspecified site: Secondary | ICD-10-CM | POA: Insufficient documentation

## 2015-09-07 DIAGNOSIS — Z87828 Personal history of other (healed) physical injury and trauma: Secondary | ICD-10-CM | POA: Diagnosis not present

## 2015-09-07 DIAGNOSIS — J45909 Unspecified asthma, uncomplicated: Secondary | ICD-10-CM | POA: Insufficient documentation

## 2015-09-07 DIAGNOSIS — J9610 Chronic respiratory failure, unspecified whether with hypoxia or hypercapnia: Secondary | ICD-10-CM | POA: Diagnosis not present

## 2015-09-07 NOTE — Progress Notes (Signed)
Tracheostomy Procedure Note  Jason [name] [id number] 1986-07-17  Pre Procedure Tracheostomy Information  Trach Brand: Shiley Size: 6 Style: Cuffed Secured by: Velcro  VS: BP-80/53, RR-18, HR-86, SpO2-96% on RA with PMV  Procedure: trach change    Post Procedure Tracheostomy Information  Trach Brand: Shiley Size: 6 Style: Cuffed Secured by: Velcro   Post Procedure Evaluation:  ETCO2 positive color change from yellow to purple : Yes.   Vital signs:blood pressure 74/50, pulse 89, respirations 18 and pulse oximetry 95% on RA with PMV Patients current condition: stable Complications: No apparent complications Trach site exam: clean Wound care done: 4 x 4 gauze Patient did tolerate procedure well.   Education: None needed  Prescription needs: None needed  See back in trach clinic in 3 months

## 2015-09-07 NOTE — Progress Notes (Signed)
     29 Year old male w/ sig medical history which includes: Asthma; MRSA infection; Gastroparesis; Diabetic neuropathy; Seizures; Family history of anesthesia complication; Dysrhythmia; Pneumonia; Arthritis; Fibromyalgia; Stroke (01/29/2014); Type I diabetes mellitus; and Syncope (02/16/2015). 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 Most recently admitted for aspiration PNA, worsening sacral decub and UTI. Presents today w/ his mother and family for routine trach follow-up   HENT:  No cough, no secretions. PULM: no SOB, not using vent at night; has not used it in almost a month  VS: BP-80/53, RR-18, HR-86, SpO2-96% on RA with PMV   General appearance: 29 Year old  Mouth: membranes and no mucosal ulcerations; normal hard and soft palate Neck: Trachea midline, # 6 cuffed trach, trach stoma unremarkable.  Lungs:scattered rhonchi, normal respiratory effort and no intercostal retractions CV: RRR, no MRGs  Extremities: No peripheral edema or extremity lymphadenopathy   Chronic respiratory failure w/ nocturnal ventilator dependence in setting of quadriplegia; deconditioning, and recurrent atelectasis in setting of poor mucociliary clearance  Size: 6 Style: Cuffed Secured by: Velcro --> stoma looks excellent. No discharge. Changed to 6 cuffed.   Plan Keep #6  Cuffed, no further downsize Cont vent PRN Will see him again in 3 months. Could consider changing it to cuffless at that point.   Erick Colace ACNP-BC [place] Pager # [phone] OR # [phone] if no answer

## 2015-09-07 NOTE — Telephone Encounter (Signed)
Hailey from [facility] call to say pt had spike in fever today 103 at 6am pt was given tylenol now down 99 . Hildred Alamin said they put in a new pick line when pt was in the hospital. She is asking if the pick line need to be left in or if it need to be removed. She said they will be using it to draw labs on Friday but not for anything else.    [facility] phone number [phone]

## 2015-09-07 NOTE — Telephone Encounter (Signed)
This question should go to his Infectious Disease doctor

## 2015-09-07 NOTE — Telephone Encounter (Signed)
Please advise 

## 2015-09-07 NOTE — Telephone Encounter (Signed)
I spoke with Hailey and she will contact the below provider.

## 2015-09-08 IMAGING — CR DG SHOULDER 1V*L*
1 series · 1 of 1 positions shown · non-contrast
Comparison: None.

CLINICAL DATA: Fall.  Left shoulder pain.

EXAM:
LEFT SHOULDER - 1 VIEW

[ap int/ext rotation]
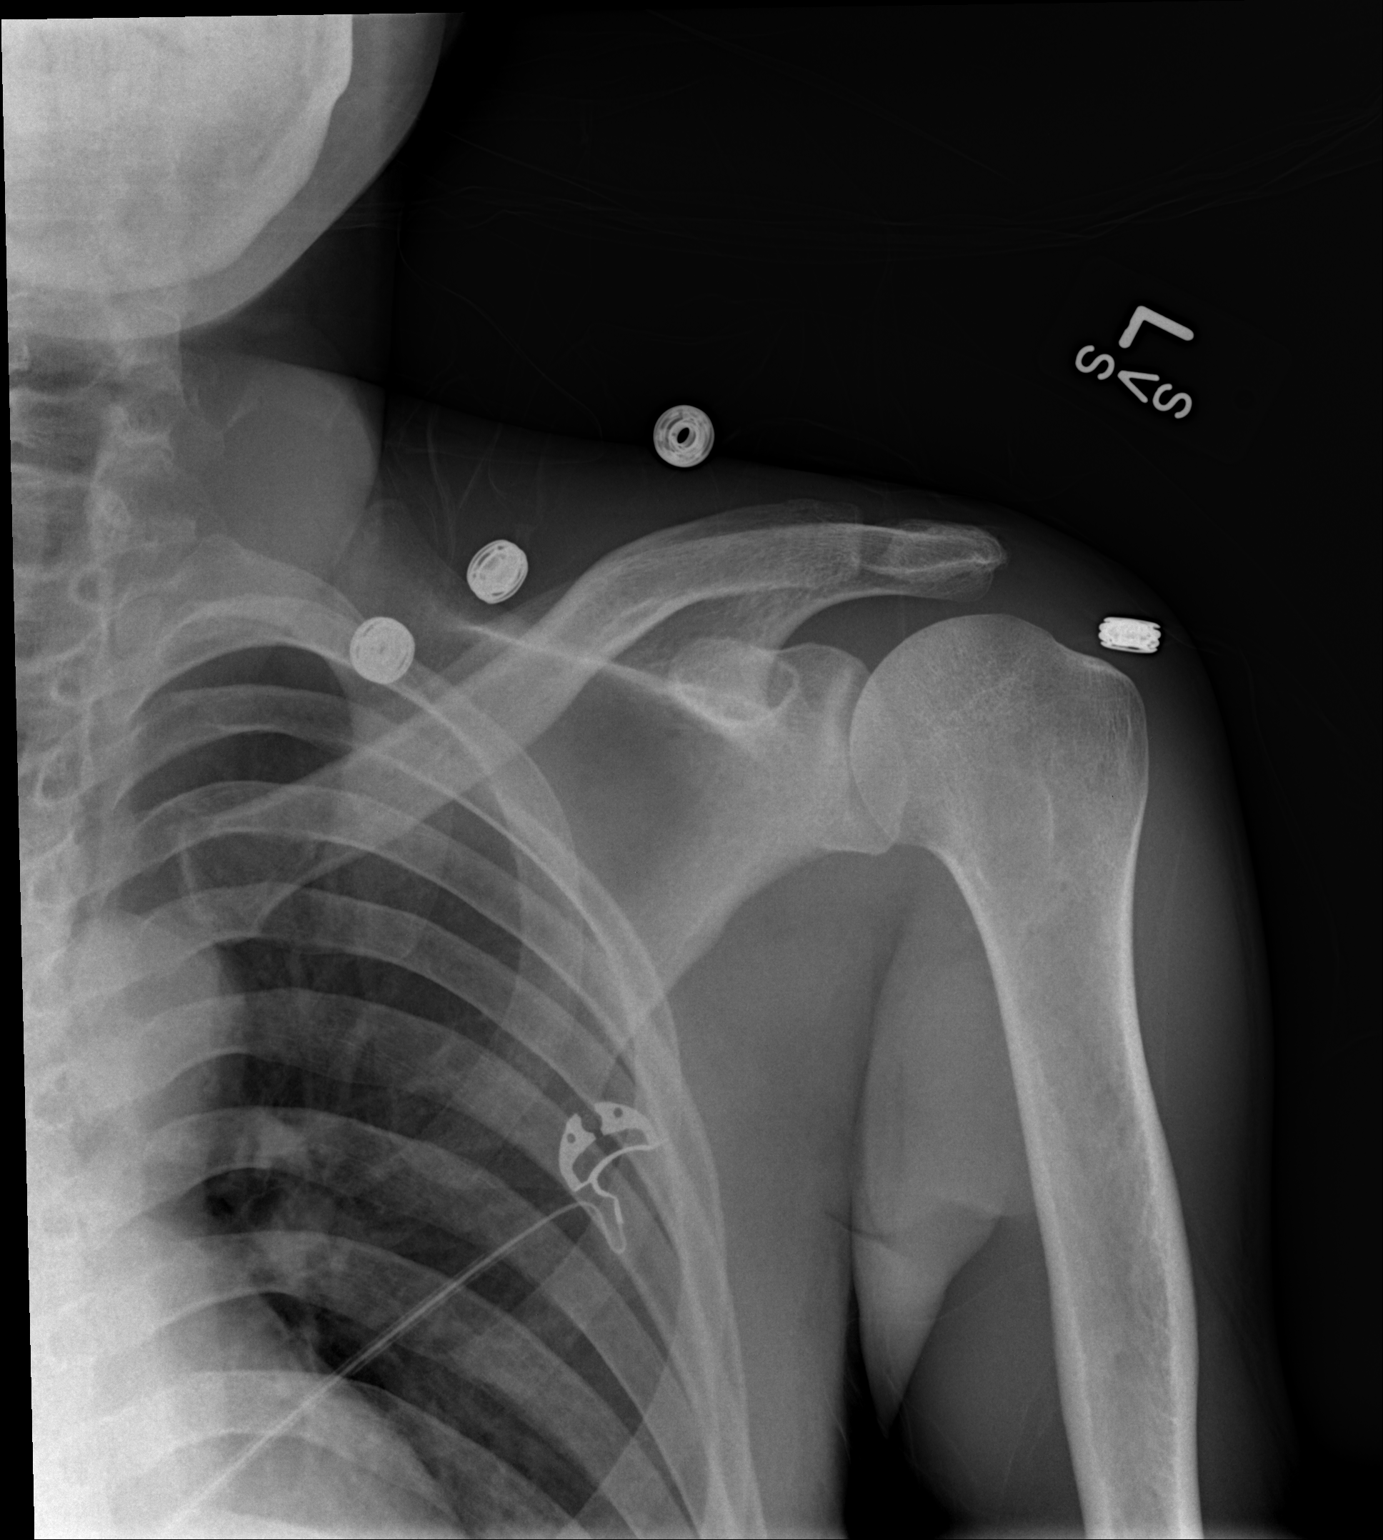

[1 of 1 positions shown; findings below may reference images not displayed]

FINDINGS: Only a single view is provided. No fracture is identified. No
evidence of dislocation is seen although an axillary or Y-view is
needed to confirm position of the humeral head. The
acromioclavicular joint is intact. Imaged left lung and ribs appear
normal.
IMPRESSION: Negative single view of the left shoulder.

## 2015-09-09 ENCOUNTER — Telehealth: Payer: Self-pay | Admitting: Infectious Diseases

## 2015-09-09 NOTE — Telephone Encounter (Signed)
Called by SNF as pt had fever to 102.  He has not change in BM, no problems with IV site.  He was given tylenol, nurse to call back if further fevers.

## 2015-09-12 ENCOUNTER — Telehealth: Payer: Self-pay | Admitting: *Deleted

## 2015-09-12 DIAGNOSIS — N390 Urinary tract infection, site not specified: Secondary | ICD-10-CM

## 2015-09-12 MED ORDER — FOSFOMYCIN TROMETHAMINE 3 G PO PACK: 3.0000 g | PACK | ORAL | Status: DC

## 2015-09-12 NOTE — Telephone Encounter (Signed)
RN shared lab report with Dr. Tommy Medal.  Dr. Tommy Medal gave verbal order for [facility] to obtain urine specimen for culture and sensitivities from the pt's suprapubic catheter closest to the insertion site not from the collection bag.  Order faxed to [facility].  Dr. Tommy Medal also ordered fosfomycin 3 grams orally once and to repeat in 3 days.

## 2015-09-13 ENCOUNTER — Ambulatory Visit: Payer: Self-pay | Admitting: Internal Medicine

## 2015-09-13 ENCOUNTER — Telehealth: Payer: Self-pay | Admitting: *Deleted

## 2015-09-13 NOTE — Telephone Encounter (Signed)
Someone from [facility]. Jason [name] is having more pain -not under control because of weather changes.  I tried to call the number left and it was not functioning.  I spoke with Jason [name] and told her Dr Jason [name] has not seen [facility] in some time (he has been in and out of hospital again) and needs to see him to make adjustments.  She understands.  I did make a switch from [place] schedule next week (12/13) over to Dr Jason [name] schedule and informed Jason [name] he needs to see Jason [name].  This will work for them.

## 2015-09-14 ENCOUNTER — Telehealth: Payer: Self-pay | Admitting: *Deleted

## 2015-09-14 IMAGING — CR DG SHOULDER 2+V*L*
3 series · 3 of 3 positions shown · non-contrast
Comparison: None.

CLINICAL DATA: Fell. Injured left shoulder.

EXAM:
LEFT SHOULDER - 2+ VIEW

[w shoulder ap internal left]
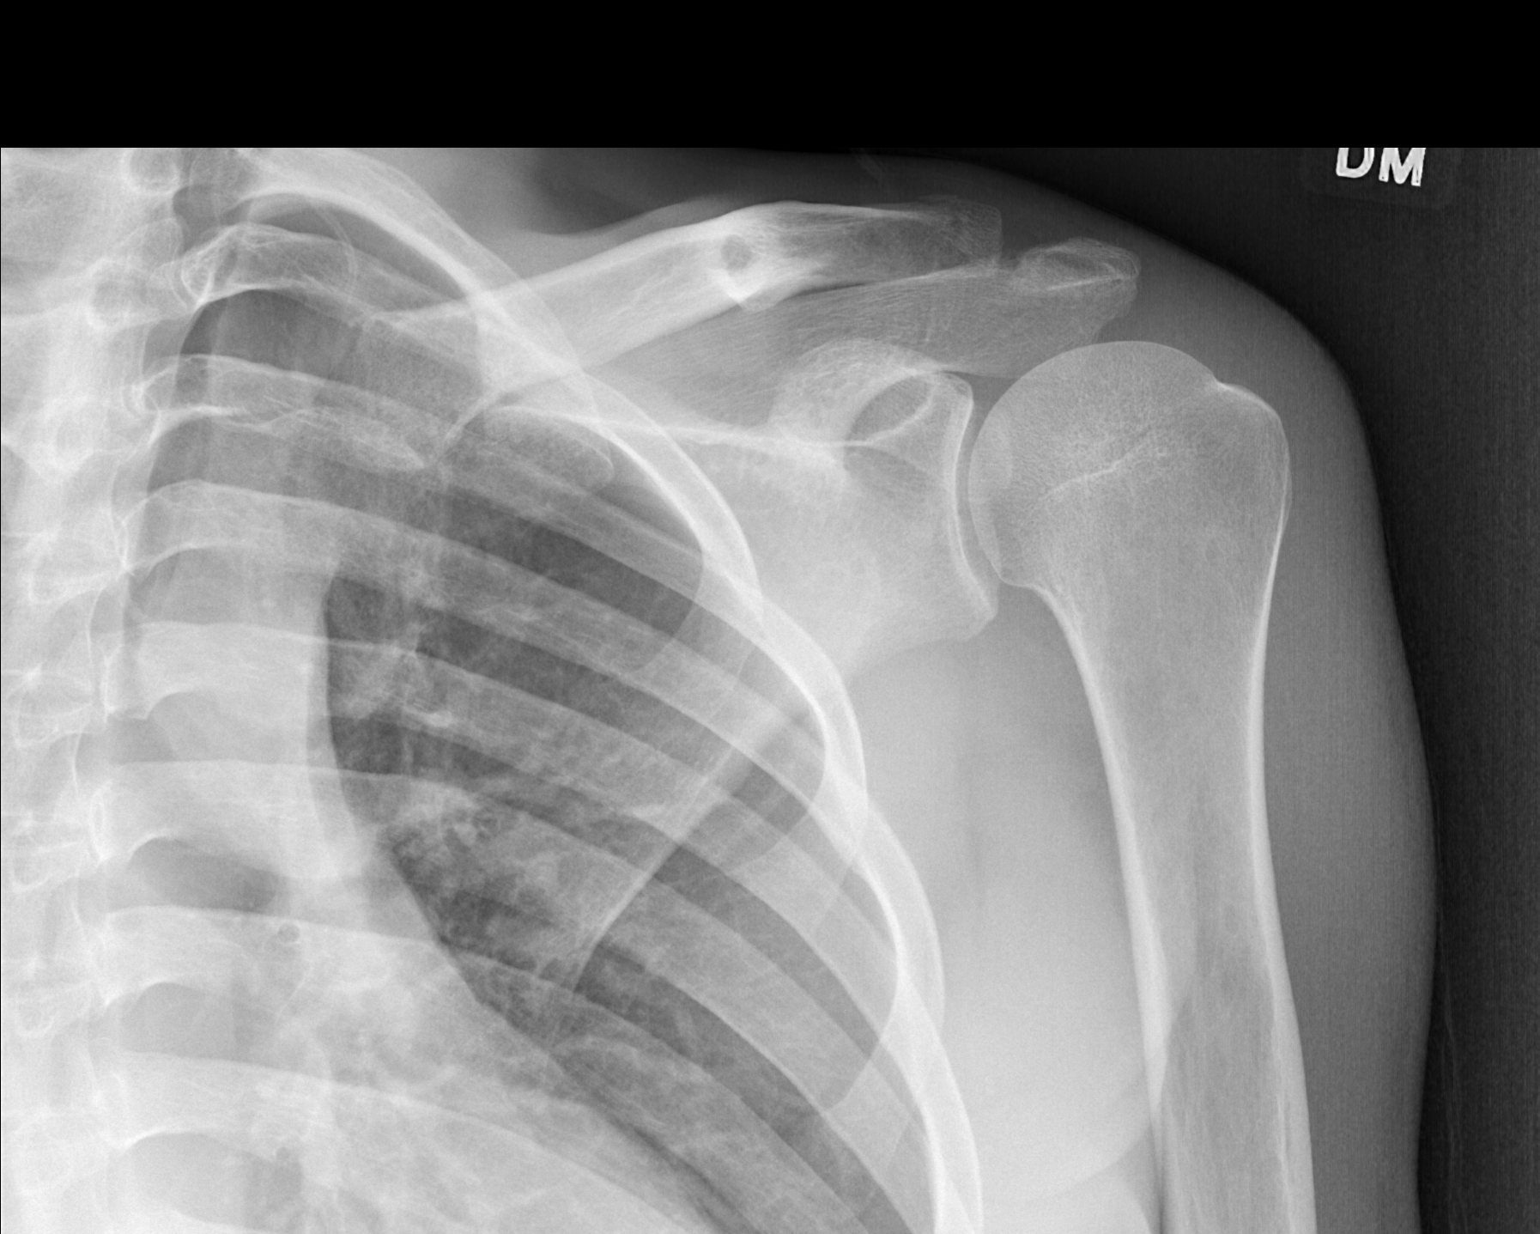

[w shoulder y view left]
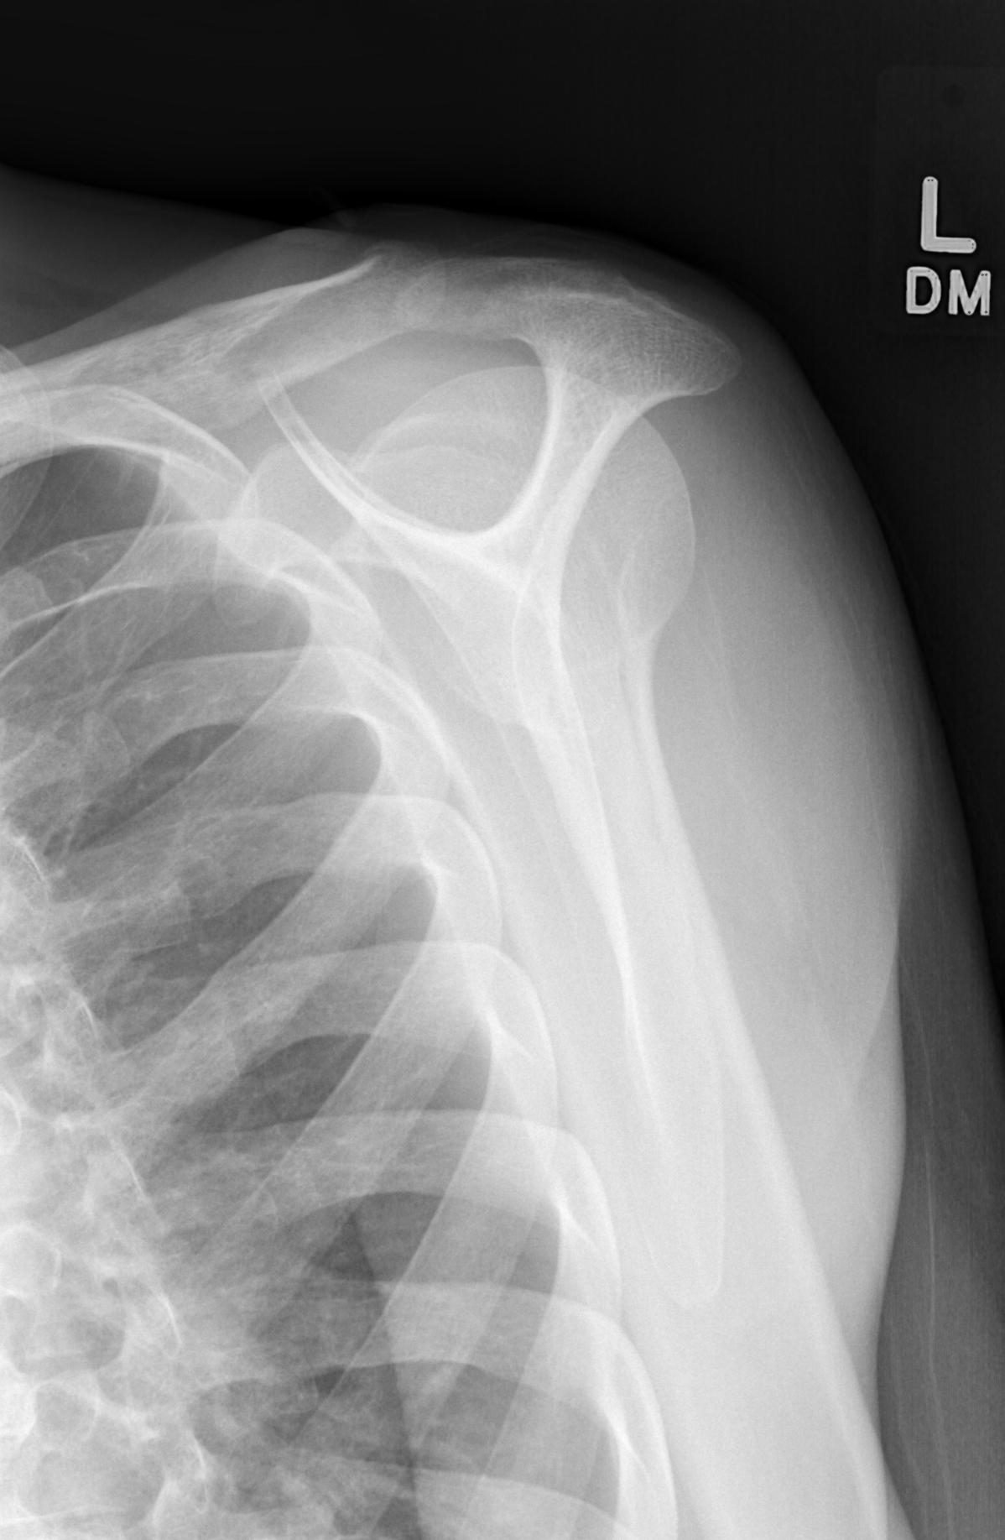

[x shoulder axillary left]
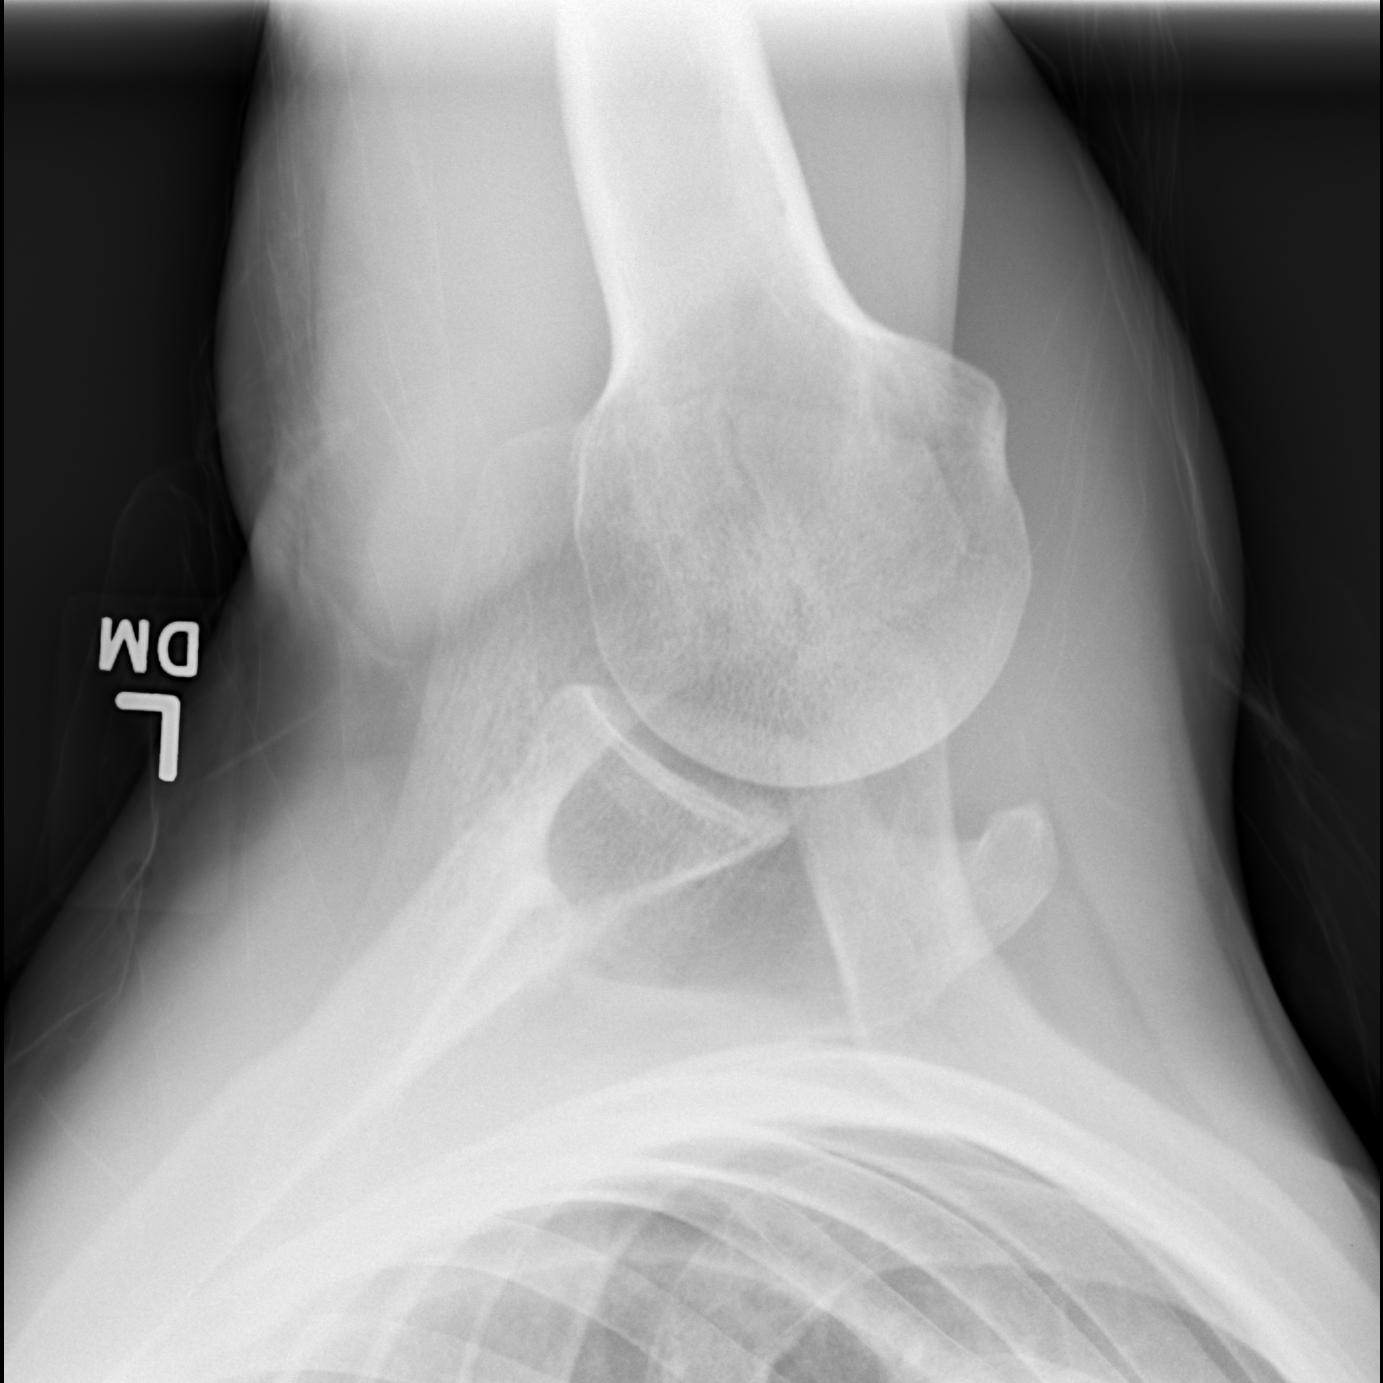

[3 of 3 positions shown; findings below may reference images not displayed]

FINDINGS: The joint spaces are maintained. There is a nondisplaced fracture
involving the distal clavicle. The left lung is clear and the
visualized left ribs are intact. Os acromial noted.
IMPRESSION: Nondisplaced fracture involving the distal clavicle.

## 2015-09-14 NOTE — Telephone Encounter (Signed)
I called her back. Said keep other abx going and keep PICC for now

## 2015-09-14 NOTE — Telephone Encounter (Signed)
Jason [name] [facility] calling for clarification of antibiotic orders.  Was prescribed monurol 3gm once with repeat in 3 days by Dr. Tommy Medal on 12/7. Cipro and metronidazole already prescribed.  [name] would like to know if these sould these be discontinued? Nurse also concerned that every time the patient's PICC is pulled, he is hospitalized within 2 weeks for infection.   RN gave her Dr. Lucianne Lei Dam's pager for further advice.  [name], RN

## 2015-09-16 ENCOUNTER — Other Ambulatory Visit: Payer: Self-pay | Admitting: *Deleted

## 2015-09-16 DIAGNOSIS — N390 Urinary tract infection, site not specified: Secondary | ICD-10-CM

## 2015-09-16 LAB — AFB CULTURE WITH SMEAR (NOT AT ARMC): ACID FAST SMEAR: NONE SEEN

## 2015-09-16 MED ORDER — FOSFOMYCIN TROMETHAMINE 3 G PO PACK: 3.0000 g | PACK | ORAL | Status: DC

## 2015-09-17 ENCOUNTER — Other Ambulatory Visit: Payer: Self-pay | Admitting: Family Medicine

## 2015-09-19 NOTE — Telephone Encounter (Signed)
I spoke with pharmacy and pt does have refills. 

## 2015-09-20 ENCOUNTER — Encounter: Payer: Medicaid Other | Admitting: Registered Nurse

## 2015-09-20 ENCOUNTER — Encounter: Payer: Self-pay | Admitting: Physical Medicine & Rehabilitation

## 2015-09-20 ENCOUNTER — Encounter: Payer: Medicaid Other | Attending: Registered Nurse | Admitting: Physical Medicine & Rehabilitation

## 2015-09-20 VITALS — BP 106/61 | HR 81 | Resp 14

## 2015-09-20 DIAGNOSIS — M869 Osteomyelitis, unspecified: Secondary | ICD-10-CM

## 2015-09-20 DIAGNOSIS — E1041 Type 1 diabetes mellitus with diabetic mononeuropathy: Secondary | ICD-10-CM | POA: Diagnosis present

## 2015-09-20 DIAGNOSIS — G8929 Other chronic pain: Secondary | ICD-10-CM | POA: Diagnosis not present

## 2015-09-20 DIAGNOSIS — G9519 Other vascular myelopathies: Secondary | ICD-10-CM | POA: Insufficient documentation

## 2015-09-20 DIAGNOSIS — Z5181 Encounter for therapeutic drug level monitoring: Secondary | ICD-10-CM | POA: Diagnosis present

## 2015-09-20 DIAGNOSIS — M791 Myalgia: Secondary | ICD-10-CM | POA: Diagnosis not present

## 2015-09-20 DIAGNOSIS — E1140 Type 2 diabetes mellitus with diabetic neuropathy, unspecified: Secondary | ICD-10-CM | POA: Diagnosis present

## 2015-09-20 DIAGNOSIS — M7918 Myalgia, other site: Secondary | ICD-10-CM

## 2015-09-20 DIAGNOSIS — N319 Neuromuscular dysfunction of bladder, unspecified: Secondary | ICD-10-CM | POA: Diagnosis present

## 2015-09-20 DIAGNOSIS — G8250 Quadriplegia, unspecified: Secondary | ICD-10-CM | POA: Insufficient documentation

## 2015-09-20 DIAGNOSIS — L89159 Pressure ulcer of sacral region, unspecified stage: Secondary | ICD-10-CM

## 2015-09-20 DIAGNOSIS — N390 Urinary tract infection, site not specified: Secondary | ICD-10-CM | POA: Diagnosis present

## 2015-09-20 DIAGNOSIS — Z79899 Other long term (current) drug therapy: Secondary | ICD-10-CM | POA: Insufficient documentation

## 2015-09-20 MED ORDER — FENTANYL 50 MCG/HR TD PT72: 50.0000 ug | MEDICATED_PATCH | TRANSDERMAL | Status: DC

## 2015-09-20 MED ORDER — BACLOFEN 20 MG PO TABS: 20.0000 mg | ORAL_TABLET | Freq: Four times a day (QID) | ORAL | Status: DC

## 2015-09-20 MED ORDER — OXYCODONE HCL 20 MG PO TABS: 20.0000 mg | ORAL_TABLET | Freq: Four times a day (QID) | ORAL | Status: DC | PRN

## 2015-09-20 NOTE — Patient Instructions (Addendum)
PLEASE CALL ME WITH ANY PROBLEMS OR QUESTIONS [id number]). HAVE A HAPPY HOLIDAY SEASON!!!        UTILIZE HEAT AND ICE ALSO TO UPPER BACK AND SHOULDERS.

## 2015-09-20 NOTE — Progress Notes (Signed)
Subjective:    Patient ID: Jason [name], male    DOB: [birth year], 29 y.o.   MRN: [id number]  [name] is here in follow up of his SCI and associated deficits and pain. He is continuing to have a lot of mid and upper back pain. It appears to be spastic in nature. He feels that its gripping and often can wake him up out of sleep.   He's had another aspiration pneumonia and UTI. Mother attributes these to his clnitron bed. He has a new bed now.   He is back on a wound vac now as wounds are improving. He is eating more and getting TF as well.     Review of Systems  Neurological: Positive for tremors, weakness and numbness.       Spasms   All other systems reviewed and are negative.  Pain Inventory Average Pain 8 Pain Right Now 8 My pain is sharp and aching  In the last 24 hours, has pain interfered with the following? General activity 6 Relation with others 8 Enjoyment of life 9 What TIME of day is your pain at its worst? evening Sleep (in general) Fair  Pain is worse with: no selection Pain improves with: medication Relief from Meds: 5  Mobility ability to climb steps?  no do you drive?  no Do you have any goals in this area?  yes  Function disabled: date disabled . I need assistance with the following:  feeding, dressing, bathing, toileting, meal prep, household duties and shopping  Neuro/Psych weakness numbness tremor spasms  Prior Studies Any changes since last visit?  no  Physicians involved in your care Any changes since last visit?  no   Family History  Problem Relation Age of Onset  . Diabetes Father   . Hypertension Father   . Asthma      fhx  . Hypertension      fhx  . Stroke      fhx  . Heart disease Mother    Social History   Social History  . Marital Status: Single    Spouse Name: N/A  . Number of Children: 0  . Years of Education: 11   Occupational History  . Disability    Social History Main Topics  . Smoking  status: Former Smoker -- 1.00 packs/day for 12 years    Types: Cigars, Cigarettes    Quit date: 09/07/2014  . Smokeless tobacco: Never Used  . Alcohol Use: No  . Drug Use: No  . Sexual Activity: Not Currently   Other Topics Concern  . None   Social History Narrative   Patient lives at home with mother father.    Patient has 2 children.    Patient is single.    Patient was left hand but after stroke patient is now right handed.    Patient has  11th grade education.    Past Surgical History  Procedure Laterality Date  . Tonsillectomy    . Multiple extractions with alveoloplasty N/A 08/03/2014    Procedure: MULTIPLE EXTRACTIONS;  Surgeon: Gae Bon, DDS;  Location: [place];  Service: Oral Surgery;  Laterality: N/A;  . Tee without cardioversion N/A 08/17/2014    Procedure: TRANSESOPHAGEAL ECHOCARDIOGRAM (TEE);  Surgeon: Dorothy Spark, MD;  [facility] ENDOSCOPY;  Service: Cardiovascular;  Laterality: N/A;  . Debridment of decubitus ulcer N/A 10/04/2014    Procedure: DEBRIDMENT OF DECUBITUS ULCER;  Surgeon: Georganna Skeans, MD;  Location: [place];  Service: General;  Laterality: N/A;  . Laparoscopic diverted colostomy N/A 10/12/2014    Procedure: LAPAROSCOPIC DIVERTING COLOSTOMY;  Surgeon: Donnie Mesa, MD;  Location: [place];  Service: General;  Laterality: N/A;  . Insertion of suprapubic catheter N/A 10/12/2014    Procedure: INSERTION OF SUPRAPUBIC CATHETER;  Surgeon: Reece Packer, MD;  Location: [place];  Service: Urology;  Laterality: N/A;  . Incision and drainage of wound N/A 11/12/2014    Procedure: IRRIGATION AND DEBRIDEMENT OF WOUNDS WITH BONE BIOPSY AND SURGICAL PREP ;  Surgeon: Theodoro Kos, DO;  Location: [place];  Service: Plastics;  Laterality: N/A;  . Application of a-cell of back N/A 11/12/2014    Procedure: APPLICATION A CELL AND VAC ;  Surgeon: Theodoro Kos, DO;  Location: [place];  Service: Plastics;  Laterality: N/A;  . Incision and drainage of wound N/A 11/18/2014     Procedure: IRRIGATION AND DEBRIDEMENT OF SACRAL ULCER ONLY WITH PLACEMENT OF A CELL AND VAC/ DRESSING CHANGE TO UPPER BACK AREA.;  Surgeon: Theodoro Kos, DO;  Location: [place];  Service: Plastics;  Laterality: N/A;  . Incision and drainage of wound N/A 11/25/2014    Procedure: IRRIGATION AND DEBRIDEMENT OF SACRAL ULCER AND BACK BURN WITH PLACEMENT OF A-CELL;  Surgeon: Theodoro Kos, DO;  Location: [place];  Service: Plastics;  Laterality: N/A;  . Minor application of wound vac N/A 11/25/2014    Procedure:  WOUND VAC CHANGE;  Surgeon: Theodoro Kos, DO;  Location: [place];  Service: Plastics;  Laterality: N/A;  . Incision and drainage of wound Right 12/08/2014    Procedure: IRRIGATION AND DEBRIDEMENT SACRAL WOUND AND RIGHT ISCHIAL WOUND ;  Surgeon: Theodoro Kos, DO;  Location: [place];  Service: Plastics;  Laterality: Right;  . Application of a-cell of back N/A 12/08/2014    Procedure: APPLICATION OF A-CELL AND WOUND VAC ;  Surgeon: Theodoro Kos, DO;  Location: [place];  Service: Plastics;  Laterality: N/A;  . Tee without cardioversion N/A 05/05/2015    Procedure: TRANSESOPHAGEAL ECHOCARDIOGRAM (TEE);  Surgeon: Lelon Perla, MD;  Location: [place];  Service: Cardiovascular;  Laterality: N/A;  . Incision and drainage of wound Bilateral 05/05/2015    Procedure: IRRIGATION AND DEBRIDEMENT SACRAL ULCER;  Surgeon: Theodoro Kos, DO;  Location: [place];  Service: Plastics;  Laterality: Bilateral;  . Hematoma evacuation N/A 05/05/2015    Procedure: EVACUATION HEMATOMA bedside procedure;  Surgeon: Theodoro Kos, DO;  Location: [place];  Service: Plastics;  Laterality: N/A;  . Incision and drainage of wound N/A 08/04/2015    Procedure: IRRIGATION AND DEBRIDEMENT OF SACRAL ULCER AND ;  Surgeon: Loel Lofty Dillingham, DO;  Location: [place];  Service: Plastics;  Laterality: N/A;  . Application of a-cell of chest/abdomen N/A 08/04/2015    Procedure: APPLICATION OF A-CELL ;  Surgeon: Loel Lofty Dillingham, DO;  Location: [place];   Service: Plastics;  Laterality: N/A;   Past Medical History  Diagnosis Date  . GERD (gastroesophageal reflux disease)   . Asthma   . Hx MRSA infection     on face  . Gastroparesis   . Diabetic neuropathy ([place])   . Seizures ([place])   . Family history of anesthesia complication     Pt mother can't have epidural procedures  . Dysrhythmia   . Pneumonia   . Arthritis   . Fibromyalgia   . Stroke ([place]) 01/29/2014    spinal stroke ; "quadriplegic since"  . Type I diabetes mellitus ([place])  sees Dr. Loanne Drilling   . Syncope 02/16/2015   BP 106/61 mmHg  Pulse 81  Resp 14  SpO2 98%  Opioid Risk Score:   Fall Risk Score:  `1  Depression screen PHQ 2/9  Depression screen [facility] 2/9 04/25/2015 02/16/2015  Decreased Interest 1 0  Down, Depressed, Hopeless 1 0  PHQ - 2 Score 2 0  Altered sleeping 1 -  Tired, decreased energy 2 -  Change in appetite 2 -  Feeling bad or failure about yourself  1 -  Trouble concentrating 0 -  Moving slowly or fidgety/restless 1 -  Suicidal thoughts 0 -  PHQ-9 Score 9 -        Objective:   Physical Exam  HEENT: #6 TRACH WITH pmv  Cardio: RRR and no murmur  Resp: CTA B/L and unlabored  GI: BS positive and mildly distended and tympanitic, mild tenderness  Extremity: No Edema  Skin: sacral/back wounds not assessed today  Neuro: pt is lethargic, unable to stay awake. Became unresponsive on a few occasions when he then awoke and became tearful. Abnormal Sensory absent in feet and Abnormal Motor 0/5 in LLE RLE: tr to 1/5 at knee. ,tr 1/5 finger extension and tr hand intrinsic bilaterally. Wrist extension 4+ to 5/5 right and 3- on left, 4/5 tricep right, 2+ left, biceps 5/5. Decreased sensation below elbows bilaterally, minimal pain/LT at left hand. Poor trunk control. Posture poor.  Musc/Skel: Generalized upper thoracic and lumbar spine tenderness. He sits with poor/slumped posture in his w/c still. No focal trigger points. Has difficulty correcting himself.     Gen NAD. He HAS gained weight. Looks much better. More alert  Psych: fairly alert, appropriate  GU: foley in place. Urine cloudy with sediment.   Assessment/Plan:  1. 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 Nutrition: improved with PO and TF 3. Chronic Pain Management:  - baclofen for muscle spasms---increased to 20mg  qid -reviewed numerous exercises, postural considerations, etc -TENS unit for use at home for upper back/shoulders.   -lyrica 100mg  tid and 200mg  qhs   -can continue with oxycodone [id number] prn #120  -fentanyl patch 38mcg. Was refilled today  -ALL NARCOTIC MEDS NEED TO COME THROUGH THIS OFFICE---PT/MOTHER UNDERSTAND -very hesitant to try other narcs given his history/tolerance  4. DM type 2 with gastroparesis: continues to be brittle---follow up per endocrine/pcp  -needs better control  5. Chronic wounds: home health continues to follow open areas  -much improved with better nutrition -local care per wound care nurse.  6. Constipation/diabetic gastroparesis/neurogenic bowel. ostomy  7. Severe diabetic peripheral neuropathy: lyrica---continue 100mg  TID and 200mg  QHS  8. Pulmonary: continue with #6 trach with PMV--   9. Neurogenic bladder: foley continues.  -foley care/hygiene is paramount  Thirty minutes of face to face patient care time were spent during this visit. All questions were encouraged and answered. Follow up with me or NP in one month

## 2015-09-21 ENCOUNTER — Telehealth: Payer: Self-pay | Admitting: Infectious Disease

## 2015-09-21 ENCOUNTER — Ambulatory Visit: Payer: Self-pay | Admitting: Infectious Disease

## 2015-09-21 NOTE — Telephone Encounter (Signed)
Dr. Tommy Medal, Please advise. Thank you!

## 2015-09-21 NOTE — Telephone Encounter (Signed)
3 grams is the prescription not 3 packets

## 2015-09-21 NOTE — Telephone Encounter (Signed)
Someone (did not specify name) with [facility], left a vm on the medication assistance line needing to clarify a prescription for monurol. Needs to know if the rx should be 3 grams or 3 packets?

## 2015-09-22 NOTE — Telephone Encounter (Signed)
Perfect

## 2015-09-22 NOTE — Telephone Encounter (Signed)
Patient picked up correct dosage 12/14.  Thanks!

## 2015-09-26 ENCOUNTER — Telehealth: Payer: Self-pay | Admitting: *Deleted

## 2015-09-26 ENCOUNTER — Telehealth: Payer: Self-pay | Admitting: Internal Medicine

## 2015-09-26 ENCOUNTER — Telehealth: Payer: Self-pay | Admitting: Family Medicine

## 2015-09-26 NOTE — Telephone Encounter (Signed)
Patient's nurse says that patient is getting a lot of air caught in trach.  He is having to lift it up to release the air.  Getting a lot of yellow drainage coming out onto gauze when changing the trach.  Having some wheezing this morning, but lung sounds clear.  Patient complains that some times he has trouble breathing with trach, having to release the air from trach several times throughout the day.

## 2015-09-26 NOTE — Telephone Encounter (Signed)
Why are you sending this to me? He has seen Marni Griffon in trach clinic. Canno see good evidence he has seen me in office unless I am mistaken. Jason [name] is working today - you could call him

## 2015-09-26 NOTE — Telephone Encounter (Signed)
Jason [name] called from [place] requesting permission to receive a Rx for Magnesium and Iron. They were initially prescribed by Edwin Cap but Dr. Sheran Fava suggested Jason [name] seek the pt's PCP in regards to receiving a refill. The Magnesium was 400Mg  and Iron 325Mg . If a refill can be sent, please send it to [facility]. If you have questions or concerns, please feel free to contact Jason [name].  Jason [name]'s ph# [phone] Thank you.

## 2015-09-26 NOTE — Telephone Encounter (Signed)
Calling for a refill on Daelen's oxycodone.  He has 10 pills left.  I spoke with ?Tamika and she says the bottle day is in September.  I told her this is not possible as he was just in the office 09/20/15 and received a new Rx.  She is to follow up with pharmacy and his mother.

## 2015-09-27 ENCOUNTER — Telehealth: Payer: Self-pay | Admitting: Acute Care

## 2015-09-27 MED ORDER — FERROUS SULFATE 325 (65 FE) MG PO TABS: 325.0000 mg | ORAL_TABLET | Freq: Every day | ORAL | Status: AC

## 2015-09-27 MED ORDER — DICLOFENAC SODIUM 1 % TD GEL: 2.0000 g | Freq: Two times a day (BID) | TRANSDERMAL | Status: DC

## 2015-09-27 MED ORDER — MAGNESIUM OXIDE 400 MG PO CAPS
400.0000 mg | ORAL_CAPSULE | Freq: Two times a day (BID) | ORAL | Status: DC
Start: 2015-09-27 — End: 2016-08-01

## 2015-09-27 NOTE — Telephone Encounter (Signed)
Per Dr. Sarajane Jews order Voltaren 1 % gel and apply twice a day.

## 2015-09-27 NOTE — Telephone Encounter (Signed)
I sent script e-scribe and spoke with Tameka.

## 2015-09-27 NOTE — Telephone Encounter (Signed)
A page will be sent to [facility] to address this message.

## 2015-09-27 NOTE — Telephone Encounter (Signed)
I sent both scripts e-scribe and spoke with Tameka.

## 2015-09-27 NOTE — Telephone Encounter (Signed)
Jason [name] called back to see if the Rx's have been sent to the [facility] in [place]. Please call her to verify.  In addition, the pt's mother wants to use a cream to put on his joints. They need an order for this.Marland KitchenMarland KitchenVoltaren Gel. Please give Jason [name] a call in regards to this as well. If this is ok please send a Rx to the pt's pharmacy with available refills. Jason [name]'s ph# [phone]  Thank you.

## 2015-09-27 NOTE — Telephone Encounter (Signed)
The ferrous sulfate is once a day and the Mg is bid. Call in one year supply of both

## 2015-09-28 NOTE — Telephone Encounter (Signed)
See phone note 12/20. Pete scheduled pt to go see him in clinic next week. Will sign off message

## 2015-09-29 ENCOUNTER — Encounter (HOSPITAL_BASED_OUTPATIENT_CLINIC_OR_DEPARTMENT_OTHER): Payer: Self-pay | Admitting: *Deleted

## 2015-09-29 ENCOUNTER — Emergency Department (HOSPITAL_BASED_OUTPATIENT_CLINIC_OR_DEPARTMENT_OTHER)
Admission: EM | Admit: 2015-09-29 | Discharge: 2015-09-29 | Disposition: A | Discharge status: Home / Self Care | Payer: Medicaid Other | Attending: Emergency Medicine | Admitting: Emergency Medicine

## 2015-09-29 ENCOUNTER — Emergency Department (HOSPITAL_BASED_OUTPATIENT_CLINIC_OR_DEPARTMENT_OTHER): Payer: Medicaid Other

## 2015-09-29 DIAGNOSIS — E1140 Type 2 diabetes mellitus with diabetic neuropathy, unspecified: Secondary | ICD-10-CM | POA: Insufficient documentation

## 2015-09-29 DIAGNOSIS — M25511 Pain in right shoulder: Secondary | ICD-10-CM | POA: Diagnosis not present

## 2015-09-29 DIAGNOSIS — Z79899 Other long term (current) drug therapy: Secondary | ICD-10-CM | POA: Insufficient documentation

## 2015-09-29 DIAGNOSIS — Z8679 Personal history of other diseases of the circulatory system: Secondary | ICD-10-CM | POA: Diagnosis not present

## 2015-09-29 DIAGNOSIS — M1990 Unspecified osteoarthritis, unspecified site: Secondary | ICD-10-CM | POA: Diagnosis not present

## 2015-09-29 DIAGNOSIS — Z8673 Personal history of transient ischemic attack (TIA), and cerebral infarction without residual deficits: Secondary | ICD-10-CM | POA: Insufficient documentation

## 2015-09-29 DIAGNOSIS — Z8719 Personal history of other diseases of the digestive system: Secondary | ICD-10-CM | POA: Insufficient documentation

## 2015-09-29 DIAGNOSIS — Z8701 Personal history of pneumonia (recurrent): Secondary | ICD-10-CM | POA: Insufficient documentation

## 2015-09-29 DIAGNOSIS — L89159 Pressure ulcer of sacral region, unspecified stage: Secondary | ICD-10-CM

## 2015-09-29 DIAGNOSIS — J45909 Unspecified asthma, uncomplicated: Secondary | ICD-10-CM | POA: Insufficient documentation

## 2015-09-29 DIAGNOSIS — G629 Polyneuropathy, unspecified: Secondary | ICD-10-CM | POA: Diagnosis not present

## 2015-09-29 DIAGNOSIS — Z87891 Personal history of nicotine dependence: Secondary | ICD-10-CM | POA: Diagnosis not present

## 2015-09-29 DIAGNOSIS — M869 Osteomyelitis, unspecified: Secondary | ICD-10-CM

## 2015-09-29 DIAGNOSIS — Z880 Allergy status to penicillin: Secondary | ICD-10-CM | POA: Insufficient documentation

## 2015-09-29 DIAGNOSIS — Z792 Long term (current) use of antibiotics: Secondary | ICD-10-CM | POA: Diagnosis not present

## 2015-09-29 DIAGNOSIS — Z8614 Personal history of Methicillin resistant Staphylococcus aureus infection: Secondary | ICD-10-CM | POA: Insufficient documentation

## 2015-09-29 MED ORDER — OXYCODONE HCL 10 MG PO TABS: 10.0000 mg | ORAL_TABLET | Freq: Two times a day (BID) | ORAL | Status: DC | PRN

## 2015-09-29 MED ORDER — OXYCODONE HCL 20 MG PO TABS: 20.0000 mg | ORAL_TABLET | Freq: Two times a day (BID) | ORAL | Status: DC | PRN

## 2015-09-29 MED ORDER — HYDROMORPHONE HCL 1 MG/ML IJ SOLN
1.0000 mg | Freq: Once | INTRAMUSCULAR | Status: AC
  Administered 2015-09-29: 1 mg via INTRAMUSCULAR
  Filled 2015-09-29: qty 1

## 2015-09-29 NOTE — ED Provider Notes (Signed)
CSN: [id number]     Arrival date & time 09/29/15  1308 History   First MD Initiated Contact with Patient 09/29/15 1336     Chief Complaint  Patient presents with  . Shoulder Pain     (Consider location/radiation/quality/duration/timing/severity/associated sxs/prior Treatment) HPI  Jason [name] is a 29 y.o. 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  He presents to the ER with complaints of right shoulder pain for 1 week. His pain has been ongoing for the past 2 weeks. He says that if he is going to fall over, he is usually grabbed by his shoulder. He sometimes is left on his shoulder for prolonged periods of time. He does not have grip strength at baseline to  Bilateral arms. He does have almost normal ROM of shoulders at baseline but he is unable to fully raise his right arm due to pain. He has tried ranging his shoulder. He takes Fentanyl and Oxycodone at baseline but it is not helping his pain at all. He is also on Lyrica and Baclofen.   ROS: The patient denies diaphoresis, fever, headache, weakness (general or focal), confusion, change of vision,  dysphagia, aphagia, shortness of breath,  abdominal pains, nausea, vomiting, diarrhea, lower extremity swelling, rash, neck pain, chest pain   Past Medical History  Diagnosis Date  . GERD (gastroesophageal reflux disease)   . Asthma   . Hx MRSA infection     on face  . Gastroparesis   . Diabetic neuropathy ([place])   . Seizures ([place])   . Family history of anesthesia complication     Pt mother can't have epidural procedures  . Dysrhythmia   . Pneumonia   . Arthritis   . Fibromyalgia   . Stroke ([place]) 01/29/2014    spinal stroke ; "quadriplegic since"  . Type I diabetes mellitus ([place])     sees Dr. Loanne Drilling   . Syncope 02/16/2015   Past Surgical History  Procedure Laterality Date   . Tonsillectomy    . Multiple extractions with alveoloplasty N/A 08/03/2014    Procedure: MULTIPLE EXTRACTIONS;  Surgeon: Gae Bon, DDS;  Location: [place];  Service: Oral Surgery;  Laterality: N/A;  . Tee without cardioversion N/A 08/17/2014    Procedure: TRANSESOPHAGEAL ECHOCARDIOGRAM (TEE);  Surgeon: Dorothy Spark, MD;  Location: [facility]COPY;  Service: Cardiovascular;  Laterality: N/A;  . Debridment of decubitus ulcer N/A 10/04/2014    Procedure: DEBRIDMENT OF DECUBITUS ULCER;  Surgeon: Georganna Skeans, MD;  Location: [place];  Service: General;  Laterality: N/A;  . Laparoscopic diverted colostomy N/A 10/12/2014    Procedure: LAPAROSCOPIC DIVERTING COLOSTOMY;  Surgeon: Donnie Mesa, MD;  Location: [place];  Service: General;  Laterality: N/A;  . Insertion of suprapubic catheter N/A 10/12/2014    Procedure: INSERTION OF SUPRAPUBIC CATHETER;  Surgeon: Reece Packer, MD;  Location: [place];  Service: Urology;  Laterality: N/A;  . Incision and drainage of wound N/A 11/12/2014    Procedure: IRRIGATION AND DEBRIDEMENT OF WOUNDS WITH BONE BIOPSY AND SURGICAL PREP ;  Surgeon: Theodoro Kos, DO;  Location: [place];  Service: Plastics;  Laterality: N/A;  . Application of a-cell of back N/A 11/12/2014    Procedure: APPLICATION A CELL AND VAC ;  Surgeon: Theodoro Kos, DO;  Location: [place];  Service: Plastics;  Laterality: N/A;  . Incision and drainage of wound N/A 11/18/2014  Procedure: IRRIGATION AND DEBRIDEMENT OF SACRAL ULCER ONLY WITH PLACEMENT OF A CELL AND VAC/ DRESSING CHANGE TO UPPER BACK AREA.;  Surgeon: Theodoro Kos, DO;  Location: [place];  Service: Plastics;  Laterality: N/A;  . Incision and drainage of wound N/A 11/25/2014    Procedure: IRRIGATION AND DEBRIDEMENT OF SACRAL ULCER AND BACK BURN WITH PLACEMENT OF A-CELL;  Surgeon: Theodoro Kos, DO;  Location: [place];  Service: Plastics;  Laterality: N/A;  . Minor application of wound vac N/A 11/25/2014    Procedure:  WOUND VAC CHANGE;  Surgeon:  Theodoro Kos, DO;  Location: [place];  Service: Plastics;  Laterality: N/A;  . Incision and drainage of wound Right 12/08/2014    Procedure: IRRIGATION AND DEBRIDEMENT SACRAL WOUND AND RIGHT ISCHIAL WOUND ;  Surgeon: Theodoro Kos, DO;  Location: [place];  Service: Plastics;  Laterality: Right;  . Application of a-cell of back N/A 12/08/2014    Procedure: APPLICATION OF A-CELL AND WOUND VAC ;  Surgeon: Theodoro Kos, DO;  Location: [place];  Service: Plastics;  Laterality: N/A;  . Tee without cardioversion N/A 05/05/2015    Procedure: TRANSESOPHAGEAL ECHOCARDIOGRAM (TEE);  Surgeon: Lelon Perla, MD;  Location: [place];  Service: Cardiovascular;  Laterality: N/A;  . Incision and drainage of wound Bilateral 05/05/2015    Procedure: IRRIGATION AND DEBRIDEMENT SACRAL ULCER;  Surgeon: Theodoro Kos, DO;  Location: [place];  Service: Plastics;  Laterality: Bilateral;  . Hematoma evacuation N/A 05/05/2015    Procedure: EVACUATION HEMATOMA bedside procedure;  Surgeon: Theodoro Kos, DO;  Location: [place];  Service: Plastics;  Laterality: N/A;  . Incision and drainage of wound N/A 08/04/2015    Procedure: IRRIGATION AND DEBRIDEMENT OF SACRAL ULCER AND ;  Surgeon: Loel Lofty Dillingham, DO;  Location: [place];  Service: Plastics;  Laterality: N/A;  . Application of a-cell of chest/abdomen N/A 08/04/2015    Procedure: APPLICATION OF A-CELL ;  Surgeon: Loel Lofty Dillingham, DO;  Location: [place];  Service: Plastics;  Laterality: N/A;   Family History  Problem Relation Age of Onset  . Diabetes Father   . Hypertension Father   . Asthma      fhx  . Hypertension      fhx  . Stroke      fhx  . Heart disease Mother    Social History  Substance Use Topics  . Smoking status: Former Smoker -- 1.00 packs/day for 12 years    Types: Cigars, Cigarettes    Quit date: 09/07/2014  . Smokeless tobacco: Never Used  . Alcohol Use: No    Review of Systems  Review of Systems All other systems negative except as documented  in the HPI. All pertinent positives and negatives as reviewed in the HPI.   Allergies  Cefuroxime axetil; Ertapenem; Morphine and related; Penicillins; Sulfa antibiotics; Tessalon; Shellfish allergy; Nsaids; and Miripirium  Home Medications   Prior to Admission medications   Medication Sig Start Date End Date Taking? Authorizing Provider  ACCU-CHEK SMARTVIEW test strip Use to check blood sugar 4 times daily. 08/15/15   Renato Shin, MD  acetaminophen (TYLENOL) 325 MG tablet Take 2 tablets (650 mg total) by mouth every 6 (six) hours as needed for mild pain, moderate pain, fever or headache. 01/14/15   Geradine Girt, DO  albuterol (PROVENTIL) (2.5 MG/3ML) 0.083% nebulizer solution Take 3 mLs (2.5 mg total) by nebulization every 4 (four) hours as needed for wheezing or shortness of breath. 04/27/14   [name], MD  baclofen (LIORESAL) 20 MG tablet Take 1 tablet (20 mg total) by mouth 4 (four) times daily. 09/20/15   Meredith Staggers, MD  camphor-menthol [facility]) lotion Apply topically as needed for itching. 09/02/15   Shanker Kristeen Mans, MD  ciprofloxacin (CIPRO) 500 MG tablet Take 1 tablet (500 mg total) by mouth 2 (two) times daily. 09/02/15   Shanker Kristeen Mans, MD  collagenase (SANTYL) ointment Apply 1 application topically every Monday, Wednesday, and Friday. Apply to necrotic tissue in the sacral wound only. 09/02/15   Shanker Kristeen Mans, MD  diclofenac sodium (VOLTAREN) 1 % GEL Apply 2 g topically 2 (two) times daily. 09/27/15   [name], MD  fentaNYL (DURAGESIC - DOSED MCG/HR) 50 MCG/HR Place 1 patch (50 mcg total) onto the skin every 3 (three) days. 09/20/15   Meredith Staggers, MD  ferrous sulfate 325 (65 FE) MG tablet Take 1 tablet (325 mg total) by mouth daily with breakfast. 09/27/15   [name], MD  fosfomycin (MONUROL) 3 G PACK Take 3 g by mouth as directed. Give patient a 3 gram dose today. 09/16/15   Truman Hayward, MD  glucagon (GLUCAGON EMERGENCY) 1 MG injection INJECT  INTO MUSCLE ONCE AS NEEDED Patient taking differently: Inject 1 mg into the muscle once as needed (low blood sugar).  07/05/15   Renato Shin, MD  GLUCERNA (GLUCERNA) LIQD 1,000 mLs by PEG Tube route continuous. 70 ml/hr    Historical Provider, MD  insulin aspart (NOVOLOG FLEXPEN) 100 UNIT/ML FlexPen Inject 0-10 Units into the skin 4 (four) times daily as needed for high blood sugar (CBG >200). Per sliding scale: CBG <200 0 units, 201-300 4 units, 301-400 6 units, >400 10 units    Historical Provider, MD  insulin glargine (LANTUS) 100 UNIT/ML injection Inject 0.15 mLs (15 Units total) into the skin at bedtime. 08/16/15   Janece Canterbury, MD  Insulin Syringes, Disposable, U-100 1 ML MISC Dispense 29G x 1/2 inch, use as directed, diagnosis code E 10.41 07/08/15   [name], MD  LORazepam (ATIVAN) 0.5 MG tablet Take 0.5 mg by mouth 3 (three) times daily as needed for anxiety.    Historical Provider, MD  Magnesium Oxide 400 MG CAPS Take 1 capsule (400 mg total) by mouth 2 (two) times daily. 09/27/15   [name], MD  metroNIDAZOLE (FLAGYL) 500 MG tablet Take 1 tablet (500 mg total) by mouth 2 (two) times daily. 09/02/15   Shanker Kristeen Mans, MD  midodrine (PROAMATINE) 10 MG tablet Take 2 tablets (20 mg total) by mouth 3 (three) times daily with meals. Patient taking differently: 40 mg by PEG Tube route 3 (three) times daily with meals. 9am, 12pm, 6pm 06/01/15   Erick Colace, NP  mirtazapine (REMERON) 7.5 MG tablet Place 1 tablet (7.5 mg total) into feeding tube at bedtime. Patient taking differently: 7.5 mg by PEG Tube route at bedtime as needed (sleep).  06/01/15   Erick Colace, NP  Multiple Vitamin (MULTIVITAMIN WITH MINERALS) TABS tablet Place 1 tablet into feeding tube daily. Patient taking differently: 1 tablet by PEG Tube route daily.  06/01/15   Erick Colace, NP  ondansetron (ZOFRAN ODT) 8 MG disintegrating tablet Place 1 tablet (8 mg total) into feeding tube every 8 (eight) hours as  needed for nausea or vomiting. Patient taking differently: 8 mg by PEG Tube route every 8 (eight) hours as needed for nausea or vomiting.  06/01/15   Erick Colace, NP  Oxycodone HCl 10 MG TABS Take 1 tablet (10 mg total) by mouth 2 (two) times daily as needed. 09/29/15   Chamia Schmutz Carlota Raspberry, PA-C  pantoprazole sodium (PROTONIX) 40 mg/20 mL PACK Place 20 mLs (40 mg total) into feeding tube daily. Patient taking differently: 40 mg by PEG Tube route daily.  06/01/15   Erick Colace, NP  pregabalin (LYRICA) 100 MG capsule Place 1 capsule (100 mg total) into feeding tube 3 (three) times daily. Take 100 mg by mouth in the morning, take 100 mg by mouth in the afternoon, take 100 mg by mouth in the evening and take 200 mg by mouth at bedtime. 08/19/15   [name], MD  pregabalin (LYRICA) 200 MG capsule Take 200 mg by mouth at bedtime.    Historical Provider, MD  saccharomyces boulardii (FLORASTOR) 250 MG capsule Take 1 capsule (250 mg total) by mouth 2 (two) times daily. 08/16/15   Janece Canterbury, MD  Water For Irrigation, Sterile (FREE WATER) SOLN Place 200 mLs into feeding tube every 8 (eight) hours. 09/02/15   Shanker Kristeen Mans, MD   BP 100/76 mmHg  Pulse 80  Temp(Src) 98.2 F (36.8 C) (Oral)  Resp 18  Ht 5' 7.75" (1.721 m)  Wt 70.308 kg  BMI 23.74 kg/m2  SpO2 95% Physical Exam  Constitutional: He appears well-developed and well-nourished. No distress.  HENT:  Head: Normocephalic and atraumatic.  Eyes: Pupils are equal, round, and reactive to light.  Neck: Normal range of motion. Neck supple.  Cardiovascular: Normal rate and regular rhythm.   Pulmonary/Chest: Effort normal.  Abdominal: Soft.  Musculoskeletal:       Right shoulder: He exhibits decreased range of motion (due to pain), tenderness (to palpationg of shoulder joint.) and pain. He exhibits no swelling, no effusion, no crepitus, no deformity, no laceration, no spasm, normal pulse and normal strength.  Neurological: He is alert.   Skin: Skin is warm and dry.  Nursing note and vitals reviewed.   ED Course  Procedures (including critical care time) Labs Review Labs Reviewed - No data to display  Imaging Review Dg Shoulder Right  09/29/2015  CLINICAL DATA:  Right shoulder pain for 2 weeks. EXAM: RIGHT SHOULDER - 2+ VIEW COMPARISON:  None. FINDINGS: No acute bony abnormality. Specifically, no fracture, subluxation, or dislocation. Soft tissues are intact. IMPRESSION: Negative. Electronically Signed   By: Rolm Baptise M.D.   On: 09/29/2015 13:54   I have personally reviewed and evaluated these images and lab results as part of my medical decision-making.   EKG Interpretation None      MDM   Final diagnoses:  Right shoulder pain    Pts xrays are unremarkable. He is not having any systemic symptoms to suggest cardiopulmonary or emergency etiology. I will give him a rx for pain medication to take in conjunction with his current regimen at needed for further pain control. He request pain control in the ED, shot of Dilaudid 1 mg IM given in the ED.  Referral to Orthopedist, care giver who is present said it will not have a hard time getting him to the doctor.  Medications  HYDROmorphone (DILAUDID) injection 1 mg (not administered)    I feel the patient has had an appropriate workup for their chief complaint at this time and likelihood of emergent condition existing is low. Discussed s/sx that warrant return to the ED.  Filed Vitals:   09/29/15 1320  BP: 100/76  Pulse: 80  Temp: 98.2 F (36.8 C)  Resp: [address] PA-C 09/29/15 1450  Merrily Pew, MD 09/30/15 [phone]

## 2015-09-29 NOTE — ED Notes (Signed)
Patient feels like he dislocated his right should since he has had pain x 4 days to his right shoulder. The patient state that his night shift nurse felt that he should have it checked. Patient as Good CMS  - within his normal limits. No obvious deformity noted.

## 2015-09-29 NOTE — ED Notes (Signed)
[facility] called for transport.  

## 2015-09-29 NOTE — Discharge Instructions (Signed)
Shoulder Pain °The shoulder is the joint that connects your arms to your body. The bones that form the shoulder joint include the upper arm bone (humerus), the shoulder blade (scapula), and the collarbone (clavicle). The top of the humerus is shaped like a ball and fits into a rather flat socket on the scapula (glenoid cavity). A combination of muscles and strong, fibrous tissues that connect muscles to bones (tendons) support your shoulder joint and hold the ball in the socket. Small, fluid-filled sacs (bursae) are located in different areas of the joint. They act as cushions between the bones and the overlying soft tissues and help reduce friction between the gliding tendons and the bone as you move your arm. Your shoulder joint allows a wide range of motion in your arm. This range of motion allows you to do things like scratch your back or throw a ball. However, this range of motion also makes your shoulder more prone to pain from overuse and injury. °Causes of shoulder pain can originate from both injury and overuse and usually can be grouped in the following four categories: °· Redness, swelling, and pain (inflammation) of the tendon (tendinitis) or the bursae (bursitis). °· Instability, such as a dislocation of the joint. °· Inflammation of the joint (arthritis). °· Broken bone (fracture). °HOME CARE INSTRUCTIONS  °· Apply ice to the sore area. °¨ Put ice in a plastic bag. °¨ Place a towel between your skin and the bag. °¨ Leave the ice on for 15-20 minutes, 3-4 times per day for the first 2 days, or as directed by your health care provider. °· Stop using cold packs if they do not help with the pain. °· If you have a shoulder sling or immobilizer, wear it as long as your caregiver instructs. Only remove it to shower or bathe. Move your arm as little as possible, but keep your hand moving to prevent swelling. °· Squeeze a soft ball or foam pad as much as possible to help prevent swelling. °· Only take  over-the-counter or prescription medicines for pain, discomfort, or fever as directed by your caregiver. °SEEK MEDICAL CARE IF:  °· Your shoulder pain increases, or new pain develops in your arm, hand, or fingers. °· Your hand or fingers become cold and numb. °· Your pain is not relieved with medicines. °SEEK IMMEDIATE MEDICAL CARE IF:  °· Your arm, hand, or fingers are numb or tingling. °· Your arm, hand, or fingers are significantly swollen or turn white or blue. °MAKE SURE YOU:  °· Understand these instructions. °· Will watch your condition. °· Will get help right away if you are not doing well or get worse. °  °This information is not intended to replace advice given to you by your health care provider. Make sure you discuss any questions you have with your health care provider. °  °Document Released: 07/04/2005 Document Revised: 10/15/2014 Document Reviewed: 01/17/2015 °Elsevier Interactive Patient Education ©2016 Elsevier Inc. ° °

## 2015-09-29 NOTE — ED Notes (Signed)
EMS transport from home- pt is paraplegic. C/o of shoulder pain x 2 weeks. No specific injury noted

## 2015-10-03 ENCOUNTER — Other Ambulatory Visit: Payer: Self-pay | Admitting: Internal Medicine

## 2015-10-06 ENCOUNTER — Telehealth: Payer: Self-pay | Admitting: Family Medicine

## 2015-10-06 NOTE — Telephone Encounter (Signed)
Per Dr. Sarajane Jews give verbal order to increase home health from 16 hours each day to 24 hours each day for 6 weeks, due to pt's dad having surgery and unable to care for pt. I spoke with Hildred Alamin from [place] at [phone] and gave verbal order.

## 2015-10-07 ENCOUNTER — Ambulatory Visit (HOSPITAL_COMMUNITY)
Admission: RE | Admit: 2015-10-07 | Discharge: 2015-10-07 | Disposition: A | Discharge status: Home / Self Care | Payer: Medicaid Other | Source: Ambulatory Visit | Attending: Acute Care | Admitting: Acute Care

## 2015-10-07 ENCOUNTER — Encounter (HOSPITAL_BASED_OUTPATIENT_CLINIC_OR_DEPARTMENT_OTHER): Payer: Medicaid Other | Attending: Internal Medicine

## 2015-10-07 DIAGNOSIS — Z930 Tracheostomy status: Secondary | ICD-10-CM

## 2015-10-07 DIAGNOSIS — L89324 Pressure ulcer of left buttock, stage 4: Secondary | ICD-10-CM | POA: Insufficient documentation

## 2015-10-07 DIAGNOSIS — Z794 Long term (current) use of insulin: Secondary | ICD-10-CM | POA: Diagnosis not present

## 2015-10-07 DIAGNOSIS — M1990 Unspecified osteoarthritis, unspecified site: Secondary | ICD-10-CM | POA: Diagnosis not present

## 2015-10-07 DIAGNOSIS — L89154 Pressure ulcer of sacral region, stage 4: Secondary | ICD-10-CM | POA: Insufficient documentation

## 2015-10-07 DIAGNOSIS — L97421 Non-pressure chronic ulcer of left heel and midfoot limited to breakdown of skin: Secondary | ICD-10-CM | POA: Diagnosis not present

## 2015-10-07 DIAGNOSIS — E1040 Type 1 diabetes mellitus with diabetic neuropathy, unspecified: Secondary | ICD-10-CM | POA: Diagnosis not present

## 2015-10-07 DIAGNOSIS — J45909 Unspecified asthma, uncomplicated: Secondary | ICD-10-CM | POA: Diagnosis not present

## 2015-10-07 DIAGNOSIS — K219 Gastro-esophageal reflux disease without esophagitis: Secondary | ICD-10-CM | POA: Insufficient documentation

## 2015-10-07 DIAGNOSIS — Z933 Colostomy status: Secondary | ICD-10-CM | POA: Insufficient documentation

## 2015-10-07 DIAGNOSIS — L97521 Non-pressure chronic ulcer of other part of left foot limited to breakdown of skin: Secondary | ICD-10-CM | POA: Diagnosis not present

## 2015-10-07 DIAGNOSIS — E1043 Type 1 diabetes mellitus with diabetic autonomic (poly)neuropathy: Secondary | ICD-10-CM | POA: Diagnosis not present

## 2015-10-07 DIAGNOSIS — E10621 Type 1 diabetes mellitus with foot ulcer: Secondary | ICD-10-CM | POA: Insufficient documentation

## 2015-10-07 DIAGNOSIS — K3184 Gastroparesis: Secondary | ICD-10-CM | POA: Diagnosis not present

## 2015-10-07 DIAGNOSIS — L89314 Pressure ulcer of right buttock, stage 4: Secondary | ICD-10-CM | POA: Diagnosis not present

## 2015-10-07 DIAGNOSIS — J0410 Acute tracheitis without obstruction: Secondary | ICD-10-CM | POA: Diagnosis not present

## 2015-10-07 DIAGNOSIS — Z8614 Personal history of Methicillin resistant Staphylococcus aureus infection: Secondary | ICD-10-CM | POA: Insufficient documentation

## 2015-10-07 DIAGNOSIS — G8250 Quadriplegia, unspecified: Secondary | ICD-10-CM | POA: Insufficient documentation

## 2015-10-07 NOTE — Progress Notes (Signed)
     29 Year old male w/ sig medical history which includes: Asthma; MRSA infection; Gastroparesis; Diabetic neuropathy; Seizures; Family history of anesthesia complication; Dysrhythmia; Pneumonia; Arthritis; Fibromyalgia; Stroke (01/29/2014); Type I diabetes mellitus; and Syncope (02/16/2015). 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 Most recently admitted for aspiration PNA, worsening sacral decub and UTI. Presents today w/ c/o trach site discomfort and tracheal discharge as well as questions re: the PMV.   HENT: No cough, + trach site secretions-->yellow. PULM: no SOB, not using vent at night; has not used it in almost a month. Mom says he has intermittent fevers.  Primary concerns 1) he was not able to exhale thru trach w/ valve in place 2) trach stoma a little more sensitive w/ purulent d/c  VS: afebrile/ sats 98%   General appearance: 29 Year old  Mouth: membranes and no mucosal ulcerations; normal hard and soft palate Neck: Trachea midline, # 6 cuffed trach, trach stoma w/ purulent drainage   Lungs:scattered rhonchi, normal respiratory effort and no intercostal retractions CV: RRR, no MRGs  Extremities: No peripheral edema or extremity lymphadenopathy   Chronic respiratory failure w/ nocturnal ventilator dependence in setting of quadriplegia; deconditioning, and recurrent atelectasis in setting of poor mucociliary clearance.  Mild Tracheitis   I educated [place] that he will not be able to exhale thru the trach when his PMV is in place as it is a one way valve. He and mom understand this. Mom tells me he is also having more trach site discharge. This indeed is true. Suspect that this is responsible for the irritation. We again discussed change to cuffless. He has an up-coming dental surgery appointment. Sounds like he will possibly be getting  some extractions so he will almost certainly require general anesthesia so for this and the fact that his cough mechanics are weak AND we still haven't got thru the winter we will leave the cuff in for now. Mom states that they already have a stand-by PRN script for Doxy at the house. This would be my choice for a tracheitis so we have discussed a short course of this for his tracheitis.   Size: 6 Style: Cuffed Secured by: Velcro --> stoma a little purulent  Plan Doxy 100mg  bid x 7d I will check on him via phone on 10/11/15 F/u 3 mo & PRN Cont vent PRN Will not change to cuffless at this point.   Erick Colace ACNP-BC [place] Pager # [phone] OR # [phone] if no answer

## 2015-10-07 NOTE — Progress Notes (Signed)
Patient seen in trach clinic for eval of trach concerns.  Marni Griffon, ACNP addressed concerns with patient.  Gauze sponge replaced, trach not changed at this time.  Will continue to monitor.

## 2015-10-09 DIAGNOSIS — R569 Unspecified convulsions: Secondary | ICD-10-CM

## 2015-10-09 HISTORY — DX: Unspecified convulsions: R56.9

## 2015-10-10 ENCOUNTER — Other Ambulatory Visit: Payer: Self-pay | Admitting: Endocrinology

## 2015-10-10 ENCOUNTER — Other Ambulatory Visit: Payer: Self-pay | Admitting: Family Medicine

## 2015-10-10 ENCOUNTER — Other Ambulatory Visit: Payer: Self-pay | Admitting: Physical Medicine & Rehabilitation

## 2015-10-11 ENCOUNTER — Other Ambulatory Visit: Payer: Self-pay | Admitting: Family Medicine

## 2015-10-11 ENCOUNTER — Telehealth: Payer: Self-pay | Admitting: Family Medicine

## 2015-10-11 MED ORDER — FREE WATER: 200.0000 mL | Freq: Three times a day (TID) | Status: DC

## 2015-10-11 MED ORDER — FREE WATER: 200.0000 mL | Freq: Three times a day (TID) | Status: AC

## 2015-10-11 MED ORDER — COLLAGENASE 250 UNIT/GM EX OINT: 1.0000 "application " | TOPICAL_OINTMENT | CUTANEOUS | Status: DC

## 2015-10-11 NOTE — Telephone Encounter (Signed)
Pt requesting collagenase (SANTYL) ointment AND Water For Irrigation, Sterile (FREE WATER) SOLN Dr Jonetta Osgood was the one who prescribe the medication  Pt last office visit was 01/26/15 Pt last Rx refill 09/02/15 #90g no refills    PLEASE ADVISE

## 2015-10-11 NOTE — Telephone Encounter (Signed)
Refill Collagenase ointment for 90 grams with 5 rf. Also refill water for irrigation as needed

## 2015-10-11 NOTE — Telephone Encounter (Signed)
Rx was sent to the pharmacy  

## 2015-10-12 ENCOUNTER — Encounter: Payer: Self-pay | Admitting: Internal Medicine

## 2015-10-13 ENCOUNTER — Encounter: Payer: Self-pay | Admitting: Infectious Disease

## 2015-10-14 ENCOUNTER — Telehealth: Payer: Self-pay

## 2015-10-14 NOTE — Telephone Encounter (Signed)
Fax number for home health is [phone]. Order should be faxed in attn to [place].

## 2015-10-14 NOTE — Telephone Encounter (Signed)
Order has been faxed

## 2015-10-14 NOTE — Telephone Encounter (Signed)
Please advise 

## 2015-10-14 NOTE — Telephone Encounter (Signed)
He may use a TENS unit. Please provide them an order. thanks

## 2015-10-14 NOTE — Telephone Encounter (Signed)
Jason [name] from [facility] is requesting an order to be sent for pt to use TENS unit.

## 2015-10-17 ENCOUNTER — Ambulatory Visit: Payer: Self-pay | Admitting: Infectious Disease

## 2015-10-19 ENCOUNTER — Encounter: Payer: Medicaid Other | Admitting: Physical Medicine & Rehabilitation

## 2015-10-19 ENCOUNTER — Telehealth: Payer: Self-pay

## 2015-10-19 ENCOUNTER — Ambulatory Visit: Payer: Self-pay | Admitting: Registered Nurse

## 2015-10-19 DIAGNOSIS — M869 Osteomyelitis, unspecified: Secondary | ICD-10-CM

## 2015-10-19 DIAGNOSIS — L89159 Pressure ulcer of sacral region, unspecified stage: Secondary | ICD-10-CM

## 2015-10-19 MED ORDER — FENTANYL 50 MCG/HR TD PT72: 50.0000 ug | MEDICATED_PATCH | TRANSDERMAL | Status: DC

## 2015-10-19 NOTE — Telephone Encounter (Signed)
rf written

## 2015-10-19 NOTE — Telephone Encounter (Signed)
Rx placed up front for pick up. Pt's mother has been notified.

## 2015-10-19 NOTE — Telephone Encounter (Signed)
Pt needs a refill on Fentanyl Patches. His appt was cancelled and he will not be able to come in until 10/31/15. Please advise on refill?

## 2015-10-21 ENCOUNTER — Telehealth: Payer: Self-pay | Admitting: Internal Medicine

## 2015-10-21 ENCOUNTER — Telehealth: Payer: Self-pay | Admitting: Family Medicine

## 2015-10-21 NOTE — Telephone Encounter (Signed)
Pt has never been seen in our office-is being followed at the trach clinic with [facility], per MR.  Called Tameka with [facility] to relay contact info for trach clinic.  Nothing further needed at this time.

## 2015-10-21 NOTE — Telephone Encounter (Signed)
Please clarify this message. His pain management doctor (Dr. Naaman Plummer ) needs to provide these

## 2015-10-21 NOTE — Telephone Encounter (Signed)
Spoke with Maurie Boettcher LPN - [facility], States that the patient is still having symptoms of trach infection ( yellow secretions at trach site ). Was seen in ED and Marni Griffon gave Rx for Doxy 100mg  (BID x 7d). Beau Fanny is wanting to continue Doxy 100mg  BID as a maintenance dose as the patient has done in the past. States that the patient is not getting rid of this infection.  Please advise Dr Chase Caller. Thanks.

## 2015-10-21 NOTE — Telephone Encounter (Signed)
I spoke with mom and pt has upcoming appointment with Dr. Naaman Plummer middle of January. She will discuss this at that time.

## 2015-10-21 NOTE — Telephone Encounter (Signed)
Pt Dad called and said he is in a lot of pain and they received a letter stating they will only pay for his narcotics if the meds are ordered by his PCP.   Dad said the pain medicine he is taking now is not working.  Would like a call back    [phone]  Mom  [phone] Dad

## 2015-10-21 NOTE — Telephone Encounter (Signed)
I am not patient doc as best as I can tell

## 2015-10-27 ENCOUNTER — Telehealth: Payer: Self-pay

## 2015-10-27 NOTE — Telephone Encounter (Signed)
Pt's mother stated that she picked up the fentanyl patch rx but it was never signed. She states that she can wait until next week to get it. She wanted [name] to be aware that she never filled it.

## 2015-10-28 ENCOUNTER — Telehealth: Payer: Self-pay | Admitting: Family Medicine

## 2015-10-28 MED ORDER — MIDODRINE HCL 10 MG PO TABS: 20.0000 mg | ORAL_TABLET | Freq: Three times a day (TID) | ORAL | Status: AC

## 2015-10-28 NOTE — Telephone Encounter (Addendum)
The nurse had accidentally spill and threw away  pt medication proamatine  And needs new rx of proamatine and [facility] will pay for medication. Please call into [facility]

## 2015-10-28 NOTE — Telephone Encounter (Signed)
Per Dr. Fry okay to refill 

## 2015-10-28 NOTE — Telephone Encounter (Signed)
I called in script and spoke with Hailey at home health.

## 2015-10-28 NOTE — Telephone Encounter (Signed)
rx was written

## 2015-10-28 NOTE — Telephone Encounter (Signed)
Can you clarify directions please?

## 2015-10-31 ENCOUNTER — Encounter: Payer: Medicaid Other | Admitting: Physical Medicine & Rehabilitation

## 2015-11-01 ENCOUNTER — Other Ambulatory Visit: Payer: Self-pay | Admitting: *Deleted

## 2015-11-01 DIAGNOSIS — L89159 Pressure ulcer of sacral region, unspecified stage: Secondary | ICD-10-CM

## 2015-11-01 DIAGNOSIS — M869 Osteomyelitis, unspecified: Secondary | ICD-10-CM

## 2015-11-01 MED ORDER — FENTANYL 50 MCG/HR TD PT72: 50.0000 ug | MEDICATED_PATCH | TRANSDERMAL | Status: DC

## 2015-11-01 NOTE — Telephone Encounter (Signed)
RX reprinted due to the RX the pt had was never signed by Dr Naaman Plummer.  Reprinted for Zella Ball to sign.

## 2015-11-02 ENCOUNTER — Ambulatory Visit
Admission: RE | Admit: 2015-11-02 | Discharge: 2015-11-02 | Disposition: A | Discharge status: Home / Self Care | Payer: Medicaid Other | Source: Ambulatory Visit | Attending: Infectious Disease | Admitting: Infectious Disease

## 2015-11-02 ENCOUNTER — Telehealth: Payer: Self-pay | Admitting: Family Medicine

## 2015-11-02 ENCOUNTER — Ambulatory Visit (INDEPENDENT_AMBULATORY_CARE_PROVIDER_SITE_OTHER): Payer: Medicaid Other | Admitting: Infectious Disease

## 2015-11-02 ENCOUNTER — Telehealth: Payer: Self-pay | Admitting: *Deleted

## 2015-11-02 ENCOUNTER — Other Ambulatory Visit: Payer: Self-pay | Admitting: Infectious Disease

## 2015-11-02 ENCOUNTER — Encounter: Payer: Self-pay | Admitting: Infectious Disease

## 2015-11-02 VITALS — BP 121/79 | HR 83 | Temp 99.0°F

## 2015-11-02 DIAGNOSIS — Z9109 Other allergy status, other than to drugs and biological substances: Secondary | ICD-10-CM

## 2015-11-02 DIAGNOSIS — L89894 Pressure ulcer of other site, stage 4: Secondary | ICD-10-CM | POA: Diagnosis not present

## 2015-11-02 DIAGNOSIS — J0410 Acute tracheitis without obstruction: Secondary | ICD-10-CM

## 2015-11-02 DIAGNOSIS — J189 Pneumonia, unspecified organism: Secondary | ICD-10-CM

## 2015-11-02 DIAGNOSIS — Z889 Allergy status to unspecified drugs, medicaments and biological substances status: Secondary | ICD-10-CM

## 2015-11-02 DIAGNOSIS — Z9359 Other cystostomy status: Secondary | ICD-10-CM | POA: Diagnosis not present

## 2015-11-02 DIAGNOSIS — D721 Eosinophilia, unspecified: Secondary | ICD-10-CM

## 2015-11-02 DIAGNOSIS — E1065 Type 1 diabetes mellitus with hyperglycemia: Secondary | ICD-10-CM

## 2015-11-02 DIAGNOSIS — Z23 Encounter for immunization: Secondary | ICD-10-CM | POA: Diagnosis not present

## 2015-11-02 DIAGNOSIS — Z930 Tracheostomy status: Secondary | ICD-10-CM | POA: Diagnosis not present

## 2015-11-02 DIAGNOSIS — N390 Urinary tract infection, site not specified: Secondary | ICD-10-CM | POA: Diagnosis not present

## 2015-11-02 DIAGNOSIS — L89324 Pressure ulcer of left buttock, stage 4: Secondary | ICD-10-CM

## 2015-11-02 DIAGNOSIS — M869 Osteomyelitis, unspecified: Secondary | ICD-10-CM

## 2015-11-02 DIAGNOSIS — G9340 Encephalopathy, unspecified: Secondary | ICD-10-CM

## 2015-11-02 DIAGNOSIS — L89314 Pressure ulcer of right buttock, stage 4: Secondary | ICD-10-CM

## 2015-11-02 DIAGNOSIS — R509 Fever, unspecified: Secondary | ICD-10-CM

## 2015-11-02 DIAGNOSIS — L89159 Pressure ulcer of sacral region, unspecified stage: Secondary | ICD-10-CM

## 2015-11-02 HISTORY — DX: Allergy status to unspecified drugs, medicaments and biological substances: Z88.9

## 2015-11-02 HISTORY — DX: Urinary tract infection, site not specified: N39.0

## 2015-11-02 LAB — CBC WITH DIFFERENTIAL/PLATELET
BASOS ABS: 0.2 10*3/uL — AB (ref 0.0–0.1)
BASOS PCT: 1 % (ref 0–1)
EOS ABS: 0.5 10*3/uL (ref 0.0–0.7)
EOS PCT: 3 % (ref 0–5)
HCT: 24.5 % — ABNORMAL LOW (ref 39.0–52.0)
Hemoglobin: 7.6 g/dL — ABNORMAL LOW (ref 13.0–17.0)
Lymphocytes Relative: 13 % (ref 12–46)
Lymphs Abs: 2.3 10*3/uL (ref 0.7–4.0)
MCH: 25.9 pg — ABNORMAL LOW (ref 26.0–34.0)
MCHC: 31 g/dL (ref 30.0–36.0)
MCV: 83.3 fL (ref 78.0–100.0)
MPV: 8.6 fL (ref 8.6–12.4)
Monocytes Absolute: 2.1 10*3/uL — ABNORMAL HIGH (ref 0.1–1.0)
Monocytes Relative: 12 % (ref 3–12)
NEUTROS PCT: 71 % (ref 43–77)
Neutro Abs: 12.5 10*3/uL — ABNORMAL HIGH (ref 1.7–7.7)
PLATELETS: 846 10*3/uL — AB (ref 150–400)
RBC: 2.94 MIL/uL — AB (ref 4.22–5.81)
RDW: 14.7 % (ref 11.5–15.5)
WBC: 17.6 10*3/uL — AB (ref 4.0–10.5)

## 2015-11-02 LAB — COMPLETE METABOLIC PANEL WITH GFR
ALT: 13 U/L (ref 9–46)
AST: 17 U/L (ref 10–40)
Albumin: 2.9 g/dL — ABNORMAL LOW (ref 3.6–5.1)
Alkaline Phosphatase: 177 U/L — ABNORMAL HIGH (ref 40–115)
BILIRUBIN TOTAL: 0.2 mg/dL (ref 0.2–1.2)
BUN: 28 mg/dL — ABNORMAL HIGH (ref 7–25)
CALCIUM: 11.1 mg/dL — AB (ref 8.6–10.3)
CO2: 24 mmol/L (ref 20–31)
CREATININE: 0.78 mg/dL (ref 0.60–1.35)
Chloride: 102 mmol/L (ref 98–110)
Glucose, Bld: 248 mg/dL — ABNORMAL HIGH (ref 65–99)
Potassium: 5.4 mmol/L — ABNORMAL HIGH (ref 3.5–5.3)
Sodium: 132 mmol/L — ABNORMAL LOW (ref 135–146)
TOTAL PROTEIN: 8.3 g/dL — AB (ref 6.1–8.1)

## 2015-11-02 LAB — C-REACTIVE PROTEIN: CRP: 7.4 mg/dL — AB (ref <0.60)

## 2015-11-02 MED ORDER — FENTANYL 50 MCG/HR TD PT72: 50.0000 ug | MEDICATED_PATCH | TRANSDERMAL | Status: DC

## 2015-11-02 MED ORDER — DOCUSATE SODIUM 100 MG PO CAPS: 100.0000 mg | ORAL_CAPSULE | Freq: Two times a day (BID) | ORAL | Status: DC

## 2015-11-02 MED ORDER — OXYCODONE HCL 20 MG PO TABS: 20.0000 mg | ORAL_TABLET | Freq: Four times a day (QID) | ORAL | Status: DC | PRN

## 2015-11-02 NOTE — Progress Notes (Signed)
Chief complaint fevers to 101-102 Subjective:    Patient ID: Jason [name], male    DOB: Jan 28, 1986, 30 y.o.   MRN: [id number]  HPI   30 y.o. 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 Migdalia Dk multiple courses of antibiotics recurrent admissions with septic shock.  I saw him in July thru August when he was admitted with worsening decubitus ulcers, osteomyelitis with MRSA bacteremia, microaerophlic bacteremia and then VAP (rx with acinetobacter and persistent fevers sp tygacil and gentamicin and then imipenem and gentamicin) His MRSA and microaerophillic strep were rx with vancomycin, zyvox and vancomycin again.   He had to have tracheostomy due to respiratory failure and had a protracted hospital course before being dc to home with a home ventilator and skilled in home nursing. He then weaned off the vent   As my last outpatient visit with [facility] in September he was hospitalized in November with what with spot at that time to be a healthcare associated pneumonia. He was treated with carbapenem therapy but again developed confusion and hallucinations along with eosinophilia. I see no myself during that hospitalization he was also seen by Dr. Baxter Flattery who changed him from his carbapenem to aztreonam.  He is ultimately discharged to home and since then has been seen in follow-up by Jerrye Bushy with [facility]. He was placed on a seven-day course of doxycycline for tracheitis that was further extended. Currently still on doxycycline. He is having fevers from up to 100 102 per day as measured by his home health nurse who accompanied him to clinic today.  He is being followed the wound care clinic with hyperbaric oxygen. Apparently the wounds had been doing well per the home care nurse. He has a wound VAC on his lower wound at present. He has not had much in the way of cough his urine  has not smelled foul at there is some particular matter present.  Past Medical History  Diagnosis Date  . GERD (gastroesophageal reflux disease)   . Asthma   . Hx MRSA infection     on face  . Gastroparesis   . Diabetic neuropathy ([place])   . Seizures ([place])   . Family history of anesthesia complication     Pt mother can't have epidural procedures  . Dysrhythmia   . Pneumonia   . Arthritis   . Fibromyalgia   . Stroke ([place]) 01/29/2014    spinal stroke ; "quadriplegic since"  . Type I diabetes mellitus ([place])     sees Dr. Loanne Drilling   . Syncope 02/16/2015    Past Surgical History  Procedure Laterality Date  . Tonsillectomy    . Multiple extractions with alveoloplasty N/A 08/03/2014    Procedure: MULTIPLE EXTRACTIONS;  Surgeon: Gae Bon, DDS;  Location: [place];  Service: Oral Surgery;  Laterality: N/A;  . Tee without cardioversion N/A 08/17/2014    Procedure: TRANSESOPHAGEAL ECHOCARDIOGRAM (TEE);  Surgeon: Dorothy Spark, MD;  Location: [facility] ENDOSCOPY;  Service: Cardiovascular;  Laterality: N/A;  . Debridment of decubitus ulcer N/A 10/04/2014    Procedure: DEBRIDMENT OF DECUBITUS ULCER;  Surgeon: Georganna Skeans, MD;  Location: [place];  Service: General;  Laterality: N/A;  . Laparoscopic diverted colostomy N/A 10/12/2014    Procedure: LAPAROSCOPIC DIVERTING COLOSTOMY;  Surgeon: Donnie Mesa, MD;  Location: [place];  Service: General;  Laterality: N/A;  . Insertion of suprapubic catheter N/A 10/12/2014    Procedure:  INSERTION OF SUPRAPUBIC CATHETER;  Surgeon: Reece Packer, MD;  Location: [place];  Service: Urology;  Laterality: N/A;  . Incision and drainage of wound N/A 11/12/2014    Procedure: IRRIGATION AND DEBRIDEMENT OF WOUNDS WITH BONE BIOPSY AND SURGICAL PREP ;  Surgeon: Theodoro Kos, DO;  Location: [place];  Service: Plastics;  Laterality: N/A;  . Application of a-cell of back N/A 11/12/2014    Procedure: APPLICATION A CELL AND VAC ;  Surgeon: Theodoro Kos, DO;  Location: [place];   Service: Plastics;  Laterality: N/A;  . Incision and drainage of wound N/A 11/18/2014    Procedure: IRRIGATION AND DEBRIDEMENT OF SACRAL ULCER ONLY WITH PLACEMENT OF A CELL AND VAC/ DRESSING CHANGE TO UPPER BACK AREA.;  Surgeon: Theodoro Kos, DO;  Location: [place];  Service: Plastics;  Laterality: N/A;  . Incision and drainage of wound N/A 11/25/2014    Procedure: IRRIGATION AND DEBRIDEMENT OF SACRAL ULCER AND BACK BURN WITH PLACEMENT OF A-CELL;  Surgeon: Theodoro Kos, DO;  Location: [place];  Service: Plastics;  Laterality: N/A;  . Minor application of wound vac N/A 11/25/2014    Procedure:  WOUND VAC CHANGE;  Surgeon: Theodoro Kos, DO;  Location: [place];  Service: Plastics;  Laterality: N/A;  . Incision and drainage of wound Right 12/08/2014    Procedure: IRRIGATION AND DEBRIDEMENT SACRAL WOUND AND RIGHT ISCHIAL WOUND ;  Surgeon: Theodoro Kos, DO;  Location: [place];  Service: Plastics;  Laterality: Right;  . Application of a-cell of back N/A 12/08/2014    Procedure: APPLICATION OF A-CELL AND WOUND VAC ;  Surgeon: Theodoro Kos, DO;  Location: [place];  Service: Plastics;  Laterality: N/A;  . Tee without cardioversion N/A 05/05/2015    Procedure: TRANSESOPHAGEAL ECHOCARDIOGRAM (TEE);  Surgeon: Lelon Perla, MD;  Location: [place];  Service: Cardiovascular;  Laterality: N/A;  . Incision and drainage of wound Bilateral 05/05/2015    Procedure: IRRIGATION AND DEBRIDEMENT SACRAL ULCER;  Surgeon: Theodoro Kos, DO;  Location: [place];  Service: Plastics;  Laterality: Bilateral;  . Hematoma evacuation N/A 05/05/2015    Procedure: EVACUATION HEMATOMA bedside procedure;  Surgeon: Theodoro Kos, DO;  Location: [place];  Service: Plastics;  Laterality: N/A;  . Incision and drainage of wound N/A 08/04/2015    Procedure: IRRIGATION AND DEBRIDEMENT OF SACRAL ULCER AND ;  Surgeon: Loel Lofty Dillingham, DO;  Location: [place];  Service: Plastics;  Laterality: N/A;  . Application of a-cell of chest/abdomen N/A 08/04/2015      Procedure: APPLICATION OF A-CELL ;  Surgeon: Loel Lofty Dillingham, DO;  Location: [place];  Service: Plastics;  Laterality: N/A;    Family History  Problem Relation Age of Onset  . Diabetes Father   . Hypertension Father   . Asthma      fhx  . Hypertension      fhx  . Stroke      fhx  . Heart disease Mother       Social History   Social History  . Marital Status: Single    Spouse Name: N/A  . Number of Children: 0  . Years of Education: 11   Occupational History  . Disability    Social History Main Topics  . Smoking status: Former Smoker -- 1.00 packs/day for 12 years    Types: Cigars, Cigarettes    Quit date: 09/07/2014  . Smokeless tobacco: Never Used  . Alcohol Use: No  . Drug Use: No  . Sexual Activity: Not  Currently   Other Topics Concern  . None   Social History Narrative   Patient lives at home with mother father.    Patient has 2 children.    Patient is single.    Patient was left hand but after stroke patient is now right handed.    Patient has  11th grade education.     Allergies  Allergen Reactions  . Cefuroxime Axetil Anaphylaxis  . Ertapenem Other (See Comments)    Rash and confusion-->tolerated Imipenem   . Morphine And Related Other (See Comments)    Changed mental status, confusion, headache, visual hallucination  . Penicillins Anaphylaxis and Other (See Comments)    Tolerated Imipenem; no reaction to 7 day course of amoxicillin in 2015 Has patient had a PCN reaction causing immediate rash, facial/tongue/throat swelling, SOB or lightheadedness with hypotension: Yes Has patient had a PCN reaction causing severe rash involving mucus membranes or skin necrosis: No Has patient had a PCN reaction that required hospitalization Yes Has patient had a PCN reaction occurring within the last 10 years: No If all of the above answers are "NO", then may proceed with Cephalo  . Sulfa Antibiotics Anaphylaxis, Shortness Of Breath and Other (See Comments)   . Tessalon [Benzonatate] Anaphylaxis  . Shellfish Allergy Itching and Other (See Comments)    Took benadryl to alleviate reaction  . Nsaids Other (See Comments)    Risk of bleeding  . Miripirium Rash and Other (See Comments)    Change in mental status     Current outpatient prescriptions:  .  ACCU-CHEK SMARTVIEW test strip, Use to check blood sugar 4 times daily., Disp: 200 each, Rfl: 0 .  acetaminophen (TYLENOL) 325 MG tablet, Take 2 tablets (650 mg total) by mouth every 6 (six) hours as needed for mild pain, moderate pain, fever or headache., Disp: , Rfl:  .  albuterol (PROVENTIL) (2.5 MG/3ML) 0.083% nebulizer solution, Take 3 mLs (2.5 mg total) by nebulization every 4 (four) hours as needed for wheezing or shortness of breath., Disp: 75 mL, Rfl: 3 .  baclofen (LIORESAL) 20 MG tablet, Take 1 tablet (20 mg total) by mouth 4 (four) times daily., Disp: 120 tablet, Rfl: 4 .  BISACODYL 5 MG EC tablet, Take ONE (1) tablet by mouth twice daily as needed FOR MODERATE CONSTIPATION, Disp: 60 tablet, Rfl: 0 .  camphor-menthol (SARNA) lotion, Apply topically as needed for itching., Disp: 222 mL, Rfl: 0 .  collagenase (SANTYL) ointment, Apply 1 application topically every Monday, Wednesday, and Friday. Apply to necrotic tissue in the sacral wound only., Disp: 90 g, Rfl: 5 .  diclofenac sodium (VOLTAREN) 1 % GEL, Apply 2 g topically 2 (two) times daily., Disp: 100 g, Rfl: 11 .  doxycycline (VIBRA-TABS) 100 MG tablet, Take 1 tablet (100 mg total) by mouth 2 (two) times daily., Disp: 30 tablet, Rfl: 0 .  fentaNYL (DURAGESIC - DOSED MCG/HR) 50 MCG/HR, Place 1 patch (50 mcg total) onto the skin every 3 (three) days., Disp: 10 patch, Rfl: 0 .  ferrous sulfate 325 (65 FE) MG tablet, Take 1 tablet (325 mg total) by mouth daily with breakfast., Disp: 30 tablet, Rfl: 11 .  fosfomycin (MONUROL) 3 G PACK, Take 3 g by mouth as directed. Give patient a 3 gram dose today., Disp: 3 g, Rfl: 0 .  glucagon (GLUCAGON  EMERGENCY) 1 MG injection, INJECT INTO MUSCLE ONCE AS NEEDED (Patient taking differently: Inject 1 mg into the muscle once as needed (low blood sugar). ),  Disp: 1 kit, Rfl: 0 .  GLUCERNA (GLUCERNA) LIQD, 1,000 mLs by PEG Tube route continuous. 70 ml/hr, Disp: , Rfl:  .  insulin aspart (NOVOLOG FLEXPEN) 100 UNIT/ML FlexPen, Inject 0-10 Units into the skin 4 (four) times daily as needed for high blood sugar (CBG >200). Per sliding scale: CBG <200 0 units, 201-300 4 units, 301-400 6 units, >400 10 units, Disp: , Rfl:  .  insulin glargine (LANTUS) 100 UNIT/ML injection, Inject 0.15 mLs (15 Units total) into the skin at bedtime., Disp: 10 mL, Rfl: 11 .  Insulin Syringes, Disposable, U-100 1 ML MISC, Dispense 29G x 1/2 inch, use as directed, diagnosis code E 10.41, Disp: 100 each, Rfl: 1 .  LORazepam (ATIVAN) 0.5 MG tablet, Take 0.5 mg by mouth 3 (three) times daily as needed for anxiety. Reported on 11/02/2015, Disp: , Rfl:  .  Magnesium Oxide 400 MG CAPS, Take 1 capsule (400 mg total) by mouth 2 (two) times daily., Disp: 60 capsule, Rfl: 11 .  midodrine (PROAMATINE) 10 MG tablet, Take 2 tablets (20 mg total) by mouth 3 (three) times daily with meals., Disp: 180 tablet, Rfl: 6 .  Multiple Vitamin (MULTIVITAMIN WITH MINERALS) TABS tablet, Place 1 tablet into feeding tube daily. (Patient taking differently: 1 tablet by PEG Tube route daily. ), Disp: , Rfl:  .  ondansetron (ZOFRAN ODT) 8 MG disintegrating tablet, Place 1 tablet (8 mg total) into feeding tube every 8 (eight) hours as needed for nausea or vomiting. (Patient taking differently: 8 mg by PEG Tube route every 8 (eight) hours as needed for nausea or vomiting. ), Disp: 60 tablet, Rfl: 5 .  Oxycodone HCl 10 MG TABS, Take 1 tablet (10 mg total) by mouth 2 (two) times daily as needed., Disp: 14 tablet, Rfl: 0 .  pantoprazole sodium (PROTONIX) 40 mg/20 mL PACK, Place 20 mLs (40 mg total) into feeding tube daily. (Patient taking differently: 40 mg by PEG Tube  route daily. ), Disp: 30 each, Rfl: 6 .  pregabalin (LYRICA) 100 MG capsule, Place 1 capsule (100 mg total) into feeding tube 3 (three) times daily. Take 100 mg by mouth in the morning, take 100 mg by mouth in the afternoon, take 100 mg by mouth in the evening and take 200 mg by mouth at bedtime., Disp: 150 capsule, Rfl: 5 .  pregabalin (LYRICA) 200 MG capsule, Take 200 mg by mouth at bedtime., Disp: , Rfl:  .  saccharomyces boulardii (FLORASTOR) 250 MG capsule, Take 1 capsule (250 mg total) by mouth 2 (two) times daily., Disp: 60 capsule, Rfl: 3 .  Water For Irrigation, Sterile (FREE WATER) SOLN, Place 200 mLs into feeding tube every 8 (eight) hours., Disp: 10000 mL, Rfl: 5    Review of Systems  Constitutional: Positive for fever. Negative for chills, diaphoresis, activity change and appetite change.  HENT: Negative for congestion, sore throat and trouble swallowing.   Respiratory: Negative for cough, shortness of breath, wheezing and stridor.   Cardiovascular: Negative for chest pain, palpitations and leg swelling.  Gastrointestinal: Negative for abdominal distention.  Genitourinary: Negative for dysuria, hematuria, flank pain and difficulty urinating.  Musculoskeletal: Negative for back pain and arthralgias.  Skin: Positive for wound.  Neurological: Negative for dizziness.  Hematological: Negative for adenopathy.  Psychiatric/Behavioral: Negative for behavioral problems, confusion, sleep disturbance, dysphoric mood, decreased concentration and agitation.       Objective:   Physical Exam  HENT:  Head: Normocephalic and atraumatic.  Eyes: Conjunctivae and EOM are normal.  Neck: Normal range of motion. Neck supple.  Cardiovascular: Normal rate, regular rhythm and normal heart sounds.  Exam reveals no gallop and no friction rub.   No murmur heard. Pulmonary/Chest: Effort normal. No respiratory distress. He has decreased breath sounds in the right lower field and the left lower field. He  has no wheezes. He has no rales.  Abdominal: Soft. He exhibits no distension.  Neurological: He is alert.  quadraplegic   Skin: Skin is warm and dry.  Psychiatric: He has a normal mood and affect. His speech is normal and behavior is normal. Thought content normal.  Nursing note and vitals reviewed.   I DID not examine his decubitus ulcers.        Assessment & Plan:   30 yo with paraplegia, sacral decubitus ulcer, chronic pelvic osteo, recurrent UTIs, recurrent admissions for sepsis now admission with MRSA and microaerophillic strep bacteremia, pelvic osteomyelitis VDRF sp tracheostomy, VAP with Acinetobacter now off ventilator but trach in place post recent treatment for tracheitis with doxycycline now with FUO  #1 FUO: Will check CBC CMP and sedimentation rate C-reactive protein and blood cultures and to size will check a chest x-ray I will have the home care nurse obtain urine culture from his suprapubic site via straight catheterization of the ostomy site. This culture and urinalysis will be faxed to [name]  #2Tracheitis: sp doxycyline  #3 Recurrent UTI's and recent rx with monurol  #4 Large decubitus ulcers with pelvic osteomyelits: needs vigilant wound care, nutrition and I would think he still needs turning despite his special mattress  #5 mx allergies with confusion and senna failure again while on a carbapenem  I spent greater than 25 minutes with the patient including greater than 50% of time in face to face counsel of the patient re his FUO, tracheitis, recurrent UTI's, mx allergie, chronic decubitus ulcers osteomyelitis, bacteremias and VAP and in coordination of his care.

## 2015-11-02 NOTE — Telephone Encounter (Signed)
Jason [name] with Jason [name] said the patient needs an order for a stool softener.

## 2015-11-02 NOTE — Telephone Encounter (Signed)
done

## 2015-11-02 NOTE — Telephone Encounter (Signed)
I sent script e-scribe and left a voice message for Tameka with [facility].

## 2015-11-02 NOTE — Telephone Encounter (Signed)
Call in Colace 100 mg bid, one year supply

## 2015-11-02 NOTE — Telephone Encounter (Signed)
Gerald Stabs from [facility] notified our office that Hariharan is now on a medicaid lock with one prescriber and one pharmacy.  Dr Sharlene Motts primary care will need to assume prescribing for [facility] since there are other medications that he would need to continuing to manage. Dr Naaman Plummer has been prescribing Fentanyl 50 mcg q 72 hours, Oxycodone 20 mg qid prn #120 monthly. I will route message and information to Dr Sharlene Motts at Dr Naaman Plummer request.

## 2015-11-02 NOTE — Telephone Encounter (Signed)
Pt dad call to say that the following meds were rejected at the pharmacy  Oxycodone HCl 20 MG TABS, FENTANYL PATCH 50MG    Medicaid will only pay for the above meds if they are rx by the PCP. Dad is calling to see if you will start writing the rx for the above meds.    [place]

## 2015-11-03 ENCOUNTER — Telehealth: Payer: Self-pay | Admitting: *Deleted

## 2015-11-03 ENCOUNTER — Other Ambulatory Visit: Payer: Self-pay | Admitting: Family Medicine

## 2015-11-03 ENCOUNTER — Telehealth: Payer: Self-pay | Admitting: Family Medicine

## 2015-11-03 ENCOUNTER — Other Ambulatory Visit: Payer: Self-pay | Admitting: Infectious Disease

## 2015-11-03 DIAGNOSIS — E875 Hyperkalemia: Secondary | ICD-10-CM

## 2015-11-03 LAB — SEDIMENTATION RATE

## 2015-11-03 NOTE — Telephone Encounter (Signed)
Ok very good.

## 2015-11-03 NOTE — Telephone Encounter (Signed)
-----   Message from Truman Hayward, MD sent at 11/03/2015 11:18 AM EST ----- Patientws K is up a bit. He will need to watch potassiumin diet and have a recheck of his BMP done in next few days

## 2015-11-03 NOTE — Telephone Encounter (Signed)
Called and spoke with Mom letting her now rx ready for pick up

## 2015-11-03 NOTE — Telephone Encounter (Signed)
[name] notified and she can not bring him for a repeat lab until Tuesday, 11/08/15. Appointment scheduled [facility]

## 2015-11-03 NOTE — Telephone Encounter (Signed)
When pt's dad came to get his prescription, he wanted to know about what they should do about seeing pain management since Dr. Sarajane Jews is writing out Pt's prescriptions now.

## 2015-11-03 NOTE — Telephone Encounter (Signed)
Scripts are ready for pick up and I spoke with mom.

## 2015-11-04 NOTE — Telephone Encounter (Signed)
I spoke with pt's mom and pt will continue to see pain management for rehab.

## 2015-11-04 NOTE — Telephone Encounter (Signed)
Per Dr. Sarajane Jews, pt will no longer need pain management doctor, since pt will be getting medications from Dr. Sarajane Jews.

## 2015-11-07 ENCOUNTER — Telehealth: Payer: Self-pay

## 2015-11-07 ENCOUNTER — Telehealth: Payer: Self-pay | Admitting: Infectious Disease

## 2015-11-07 ENCOUNTER — Telehealth: Payer: Self-pay | Admitting: *Deleted

## 2015-11-07 ENCOUNTER — Encounter (HOSPITAL_BASED_OUTPATIENT_CLINIC_OR_DEPARTMENT_OTHER): Payer: Medicaid Other | Attending: Internal Medicine

## 2015-11-07 DIAGNOSIS — M797 Fibromyalgia: Secondary | ICD-10-CM | POA: Insufficient documentation

## 2015-11-07 DIAGNOSIS — L89314 Pressure ulcer of right buttock, stage 4: Secondary | ICD-10-CM | POA: Insufficient documentation

## 2015-11-07 DIAGNOSIS — L97521 Non-pressure chronic ulcer of other part of left foot limited to breakdown of skin: Secondary | ICD-10-CM | POA: Insufficient documentation

## 2015-11-07 DIAGNOSIS — Z8614 Personal history of Methicillin resistant Staphylococcus aureus infection: Secondary | ICD-10-CM | POA: Diagnosis not present

## 2015-11-07 DIAGNOSIS — E1040 Type 1 diabetes mellitus with diabetic neuropathy, unspecified: Secondary | ICD-10-CM | POA: Diagnosis not present

## 2015-11-07 DIAGNOSIS — K219 Gastro-esophageal reflux disease without esophagitis: Secondary | ICD-10-CM | POA: Diagnosis not present

## 2015-11-07 DIAGNOSIS — J45909 Unspecified asthma, uncomplicated: Secondary | ICD-10-CM | POA: Insufficient documentation

## 2015-11-07 DIAGNOSIS — L89154 Pressure ulcer of sacral region, stage 4: Secondary | ICD-10-CM | POA: Diagnosis present

## 2015-11-07 DIAGNOSIS — K3184 Gastroparesis: Secondary | ICD-10-CM | POA: Diagnosis not present

## 2015-11-07 DIAGNOSIS — L89324 Pressure ulcer of left buttock, stage 4: Secondary | ICD-10-CM | POA: Insufficient documentation

## 2015-11-07 DIAGNOSIS — E1043 Type 1 diabetes mellitus with diabetic autonomic (poly)neuropathy: Secondary | ICD-10-CM | POA: Insufficient documentation

## 2015-11-07 DIAGNOSIS — L97421 Non-pressure chronic ulcer of left heel and midfoot limited to breakdown of skin: Secondary | ICD-10-CM | POA: Diagnosis not present

## 2015-11-07 DIAGNOSIS — E10621 Type 1 diabetes mellitus with foot ulcer: Secondary | ICD-10-CM | POA: Diagnosis not present

## 2015-11-07 DIAGNOSIS — G8250 Quadriplegia, unspecified: Secondary | ICD-10-CM | POA: Diagnosis not present

## 2015-11-07 MED ORDER — CIPROFLOXACIN HCL 500 MG PO TABS: 500.0000 mg | ORAL_TABLET | Freq: Two times a day (BID) | ORAL | Status: DC

## 2015-11-07 NOTE — Telephone Encounter (Signed)
Advised pts mother

## 2015-11-07 NOTE — Telephone Encounter (Signed)
Pt's mother called wanting f/u information on chest x-ray.  RN spoke with Dr. Tommy Medal.  Dr. Tommy Medal would appreciate a copy of the OV notes from the [facility] for review.  Request made for these notes.

## 2015-11-07 NOTE — Telephone Encounter (Signed)
Patient's urine with Klebsiella Pneumoniae R to AMP, I to macrobid  Due to mx allergies can only use FQ  Will give cipro 500mg  bid x 10 days

## 2015-11-07 NOTE — Telephone Encounter (Signed)
No. I would be happy to provide advice and see him as needed.

## 2015-11-07 NOTE — Telephone Encounter (Signed)
Pt's mother would like to know if it is still necessary to come in for the appointment scheduled tomorrow since Dr. Sharlene Motts is taking over pain rx's?  She would like to keep ZS for rehab purposes, even though he can not rx pain meds due to Medicaid block.

## 2015-11-08 ENCOUNTER — Other Ambulatory Visit: Payer: Medicaid Other

## 2015-11-08 ENCOUNTER — Encounter: Payer: Medicaid Other | Admitting: Physical Medicine & Rehabilitation

## 2015-11-08 LAB — CULTURE, BLOOD (SINGLE)
ORGANISM ID, BACTERIA: NO GROWTH
ORGANISM ID, BACTERIA: NO GROWTH

## 2015-11-08 NOTE — Telephone Encounter (Signed)
Pt's appt has been cancelled. 

## 2015-11-08 NOTE — Telephone Encounter (Signed)
Notified patient's mother.  She had a question regarding a Chest CT?? Please advise.

## 2015-11-10 ENCOUNTER — Telehealth: Payer: Self-pay | Admitting: Family Medicine

## 2015-11-10 MED ORDER — DOCUSATE SODIUM 50 MG/5ML PO LIQD: 100.0000 mg | Freq: Two times a day (BID) | ORAL | Status: AC

## 2015-11-10 NOTE — Telephone Encounter (Signed)
Call in Docusate liquid 50 mg /5 ml to give 10 ml bid for one year supply

## 2015-11-10 NOTE — Telephone Encounter (Signed)
Pharm is requesting docusate in liquid. Pt is on feeding tube

## 2015-11-10 NOTE — Telephone Encounter (Signed)
I sent script e-scribe. 

## 2015-11-16 ENCOUNTER — Telehealth: Payer: Self-pay | Admitting: *Deleted

## 2015-11-16 ENCOUNTER — Encounter: Payer: Medicaid Other | Admitting: Physical Medicine & Rehabilitation

## 2015-11-16 NOTE — Telephone Encounter (Signed)
Asking about duration of Cipro rx.  Order Cipro 500 mg BID for 10 days starting 11/07/15.  [facility] RN verbalized back the order.

## 2015-11-19 ENCOUNTER — Other Ambulatory Visit: Payer: Self-pay | Admitting: Family Medicine

## 2015-11-21 ENCOUNTER — Telehealth: Payer: Self-pay | Admitting: Family Medicine

## 2015-11-21 MED ORDER — MUPIROCIN 2 % EX OINT: TOPICAL_OINTMENT | CUTANEOUS | Status: DC

## 2015-11-21 NOTE — Telephone Encounter (Signed)
Call in Bactroban 2% ointment to apply bid prn, 30 gm with 2 rf

## 2015-11-21 NOTE — Telephone Encounter (Signed)
I sent script e-scribe and spoke with pt's mom.

## 2015-11-21 NOTE — Telephone Encounter (Signed)
tameka lpn from [employer] is concerning pt belly button has a discharge and she thinks it may be infection. Please advise. [name] pharm

## 2015-11-23 NOTE — Telephone Encounter (Signed)
This will be sent to the [facility] per [facility]

## 2015-11-24 ENCOUNTER — Telehealth: Payer: Self-pay | Admitting: *Deleted

## 2015-11-24 ENCOUNTER — Telehealth: Payer: Self-pay | Admitting: Internal Medicine

## 2015-11-24 ENCOUNTER — Other Ambulatory Visit: Payer: Self-pay | Admitting: Family Medicine

## 2015-11-24 NOTE — Telephone Encounter (Signed)
Spoke with home health and let her know we have not seen the pt since 2015. Pt had an EGD with Dr. Hilarie Fredrickson but no colonoscopy. States pt had his wound vac changed today and there was stool about the size of a small bowel movement coming from his rectum. Discussed with nurse that pt has not been here since 2015. Nurse will contact surgeons office.

## 2015-11-24 NOTE — Telephone Encounter (Signed)
Pt would like to start back on claritan. (per mom request) Jason [name] w/ [facility] states she needs an order to restart this med, due to it was  dc'd when pt came out of hospital on November 8.  Jason [name] would like verbal first.

## 2015-11-24 NOTE — Telephone Encounter (Signed)
Question about whether the pt should be on prophylactic Doxycycline?  Doxycycline is not currently on the pt's medication profile after being discharged from the hospital.  MD please advise.  If rxed please note dose, quantity refills and directions.

## 2015-11-25 ENCOUNTER — Telehealth: Payer: Self-pay | Admitting: Family Medicine

## 2015-11-25 DIAGNOSIS — M869 Osteomyelitis, unspecified: Secondary | ICD-10-CM

## 2015-11-25 DIAGNOSIS — L89159 Pressure ulcer of sacral region, unspecified stage: Secondary | ICD-10-CM

## 2015-11-25 MED ORDER — FENTANYL 50 MCG/HR TD PT72: 50.0000 ug | MEDICATED_PATCH | TRANSDERMAL | Status: DC

## 2015-11-25 MED ORDER — LORATADINE 10 MG PO TABS: 10.0000 mg | ORAL_TABLET | Freq: Every day | ORAL | Status: AC

## 2015-11-25 NOTE — Telephone Encounter (Signed)
Pt request refill of the following: fentaNYL (DURAGESIC - DOSED MCG/HR) 50 MCG/HR   Phamacy:

## 2015-11-25 NOTE — Telephone Encounter (Signed)
Start on Claritin 10 mg daily

## 2015-11-25 NOTE — Telephone Encounter (Signed)
Rx was sent to the pharmacy  

## 2015-11-25 NOTE — Telephone Encounter (Signed)
done

## 2015-11-28 ENCOUNTER — Telehealth: Payer: Self-pay | Admitting: Family Medicine

## 2015-11-28 ENCOUNTER — Telehealth: Payer: Self-pay | Admitting: *Deleted

## 2015-11-28 NOTE — Telephone Encounter (Signed)
Hildred Alamin, RN with [facility] [phone] called to enquire if Dr. Tommy Medal is planning on scheduling an upcoming CT. The Mother had mentioned this at a home visit. I spoke to Dr. Tommy Medal and he would like to see the notes from Wound Care. She has a copy of the last note and will fax that and he has a future wound care appt on 12/05/15. She said he is showing improvement. She feels he has some anxiety and that is why he had to lie flat and have assist with breathing at the last time a scan was done.

## 2015-11-28 NOTE — Telephone Encounter (Signed)
Script is ready for pick up and spoke with dad.

## 2015-11-28 NOTE — Telephone Encounter (Signed)
Haley from [facility] call to say Pt has pulse coming form his belly button and they are also having hygiene issues with the pt. He does not want to change cloths or have a bath.,She said they see signs of depression and has discussed this with the pt and his Mom. She said pt has denied depression issues.

## 2015-11-28 NOTE — Telephone Encounter (Signed)
No he should not be on it prophylactically that was NOT the intent the intent was to treat for deep infection in his bones in the past

## 2015-11-29 MED ORDER — BUPROPION HCL ER (XL) 150 MG PO TB24: 150.0000 mg | ORAL_TABLET | Freq: Every day | ORAL | Status: DC

## 2015-11-29 NOTE — Telephone Encounter (Signed)
I think if he is doing well we do not necessarily need to pursue the CT scacn at this point

## 2015-11-29 NOTE — Telephone Encounter (Signed)
Call in Wellbutrin XL 150 mg to take daily, #30 with 5 rf. Also they have Bactroban ointment at home, so apply this to the belly button

## 2015-11-29 NOTE — Telephone Encounter (Signed)
I spoke with mom and sent script for Wellbutrin to pharmacy, pt has [facility] already.

## 2015-12-01 DIAGNOSIS — L89154 Pressure ulcer of sacral region, stage 4: Secondary | ICD-10-CM | POA: Diagnosis not present

## 2015-12-01 DIAGNOSIS — I38 Endocarditis, valve unspecified: Secondary | ICD-10-CM | POA: Diagnosis not present

## 2015-12-01 DIAGNOSIS — D649 Anemia, unspecified: Secondary | ICD-10-CM | POA: Diagnosis not present

## 2015-12-01 DIAGNOSIS — M4628 Osteomyelitis of vertebra, sacral and sacrococcygeal region: Secondary | ICD-10-CM | POA: Diagnosis not present

## 2015-12-01 IMAGING — CT CT ABD-PELV W/ CM
2 of 3 series · 15 of 46 positions shown, 17 images · IV contrast (omnipaque)
Comparison: 04/11/2013

CLINICAL DATA: Vomiting, gastroparesis

EXAM:
CT ABDOMEN AND PELVIS WITH CONTRAST
TECHNIQUE: Multidetector CT imaging of the abdomen and pelvis was performed
using the standard protocol following bolus administration of
intravenous contrast.
CONTRAST:  100mL OMNIPAQUE IOHEXOL 300 MG/ML  SOLN

[Series 2: abd/pelvis 5.0 b31f · axial · 0.61mm/px · z∈[-529,-139]mm · 12 of 90 slices shown, 14 images]
[im 6/90  soft-tissue]
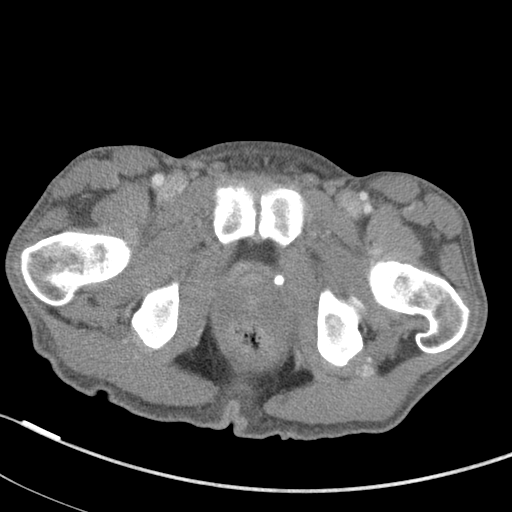
[im 6/90  bone]
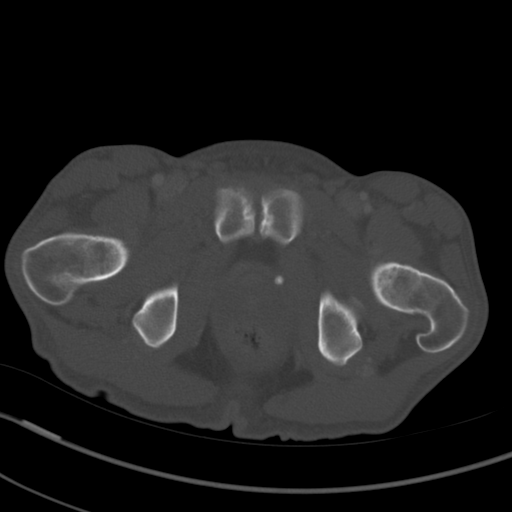
[im 12/90  soft-tissue]
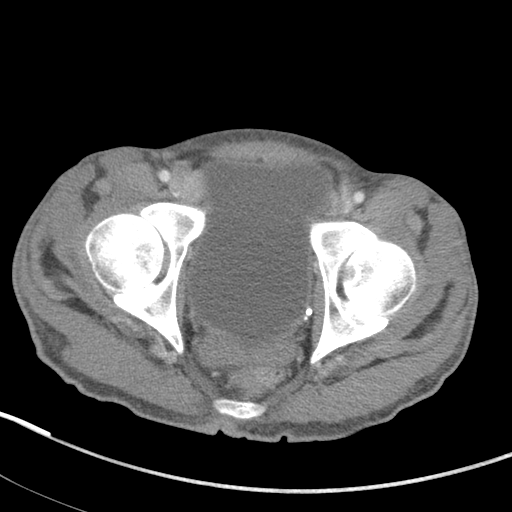
[im 21/90  soft-tissue]
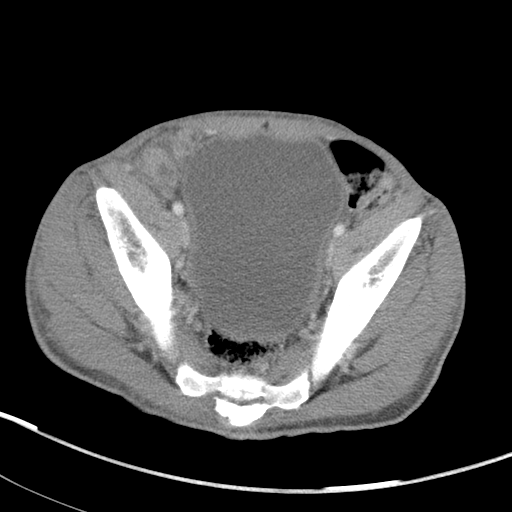
[im 26/90  soft-tissue]
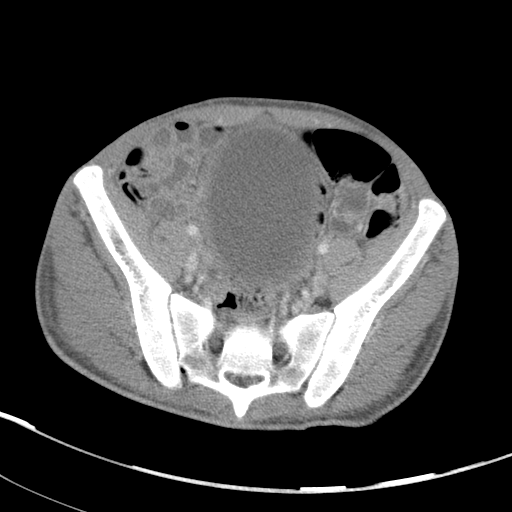
[im 35/90  soft-tissue]
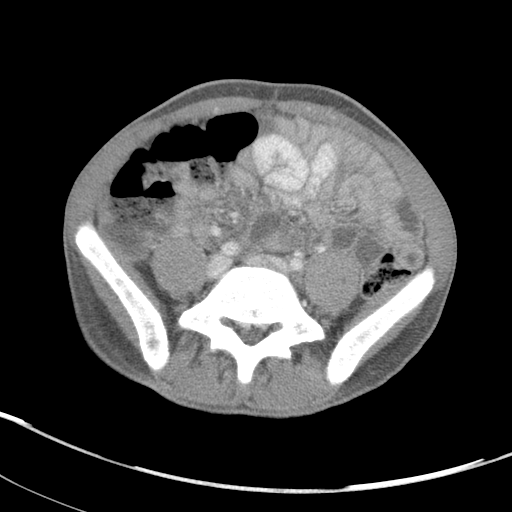
[im 41/90  soft-tissue]
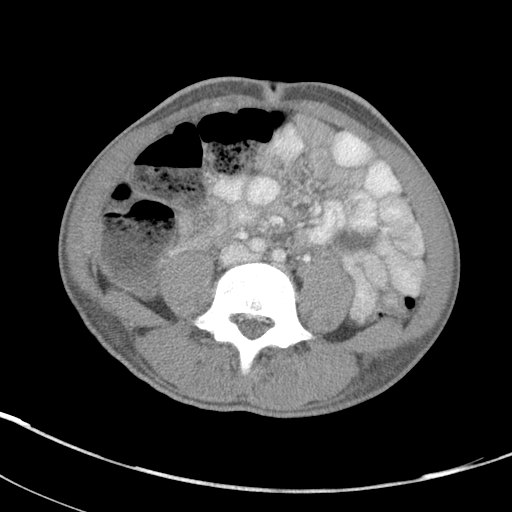
[im 49/90  soft-tissue]
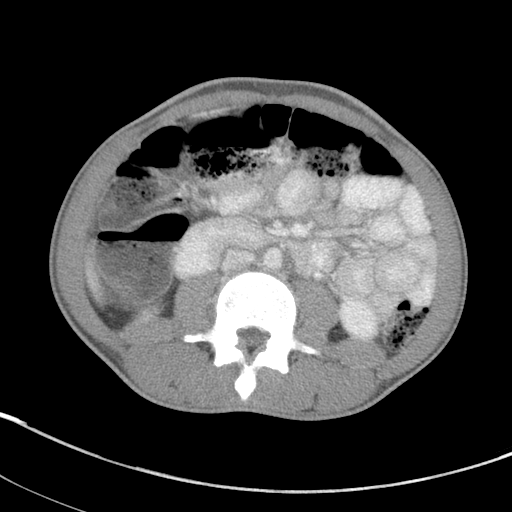
[im 55/90  soft-tissue]
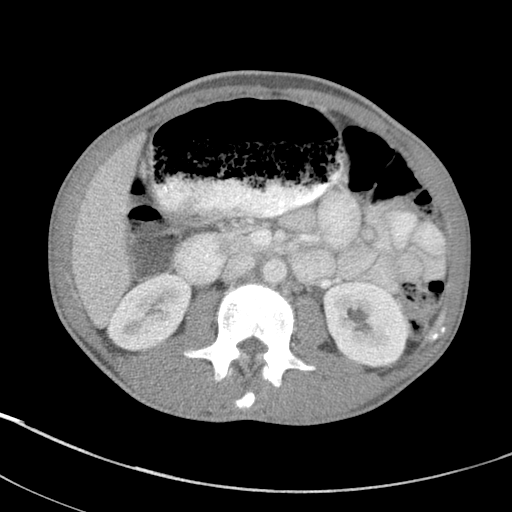
[im 64/90  soft-tissue]
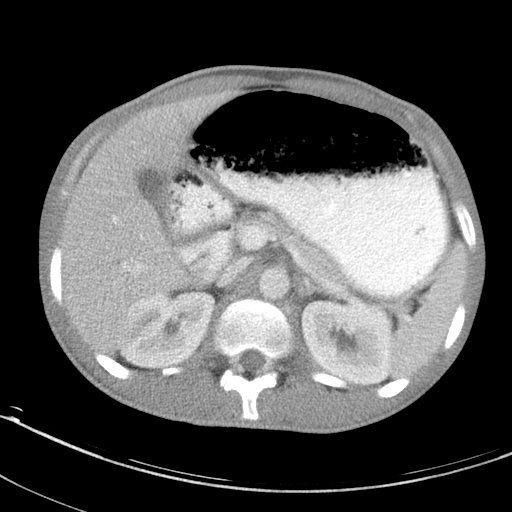
[im 64/90  bone]
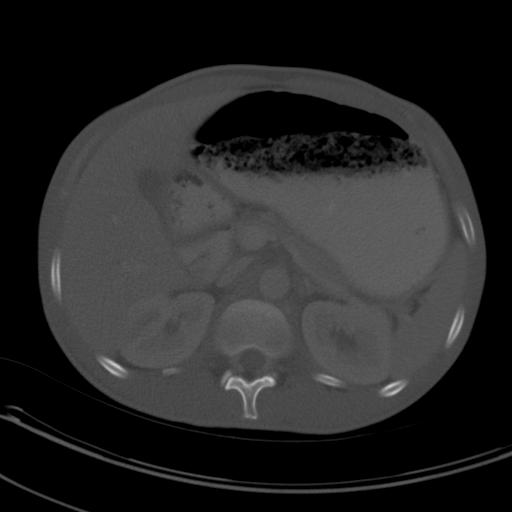
[im 69/90  soft-tissue]
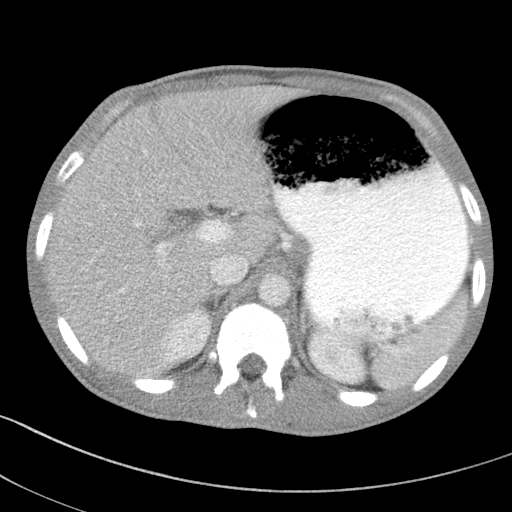
[im 78/90  soft-tissue]
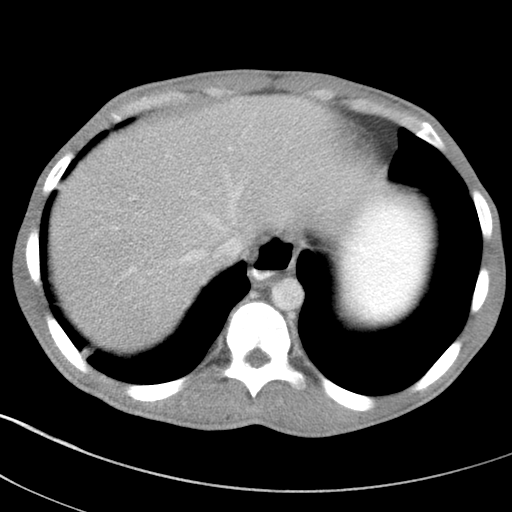
[im 84/90  soft-tissue]
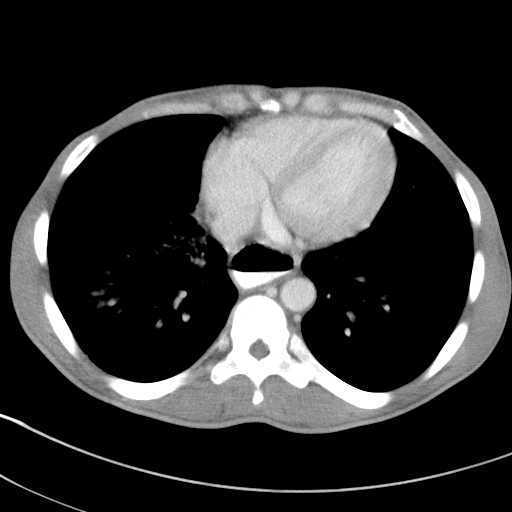

[Series 5: abd/pelvis 3.0 coronal · coronal · 0.62mm/px · 3 of 73 slices shown]
[im 25/73  soft-tissue]
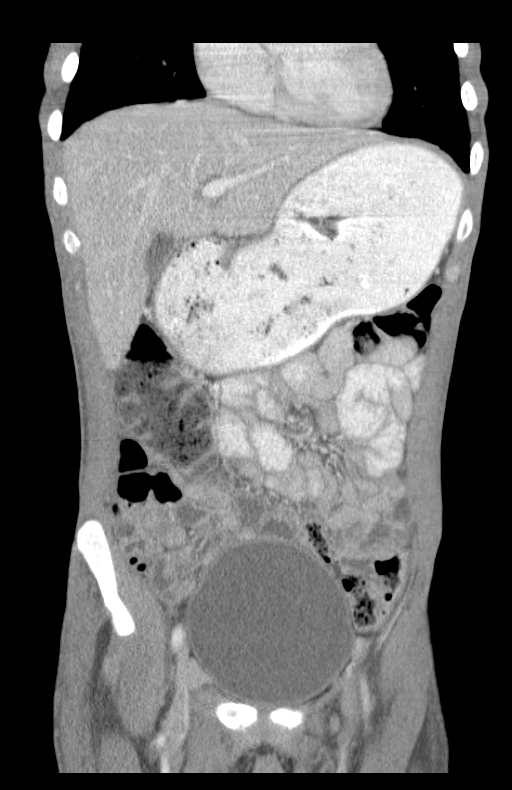
[im 33/73  soft-tissue]
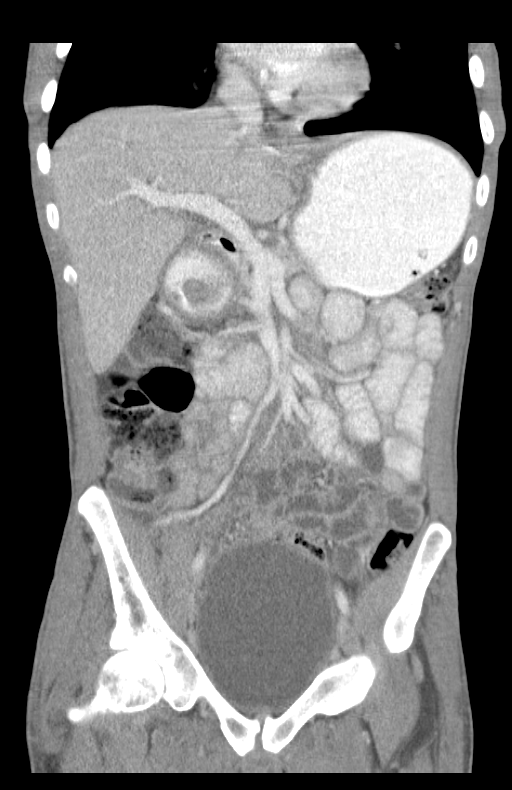
[im 41/73  soft-tissue]
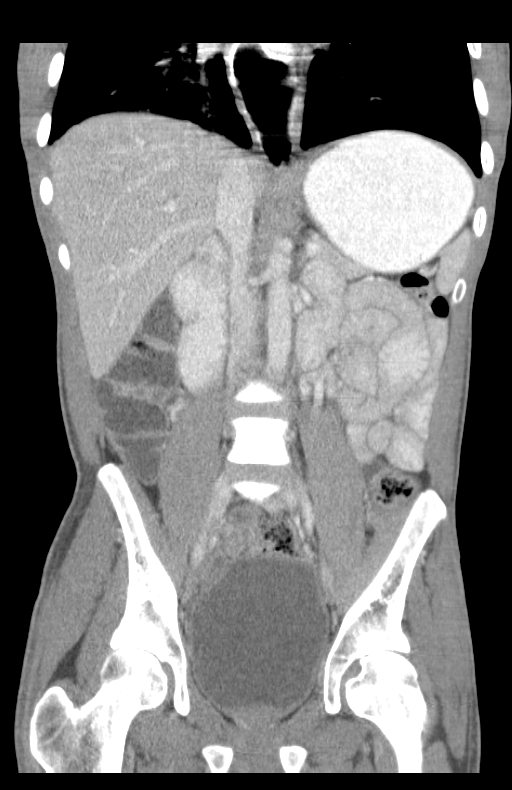

[15 of 46 positions shown; findings below may reference images not displayed]

FINDINGS: Lung bases shows patchy infiltrate bilateral lower lobe right
greater than loop left. Findings are highly suspicious for
aspiration pneumonia. Clinical correlation is necessary. Moderate
size hiatal hernia measures 3.8 x 3.2 cm. There is significant
gastric distension with contrast and debris highly suspicious for
gastroparesis. No evidence of gastric outlet obstruction.
Nonspecific mild distended small bowel loops with fluid without
evidence of transition point in caliber. No evidence of small bowel
obstruction.

Some stool noted in right colon and transverse colon. Normal
appendix clearly visualize in axial image 66. Markedly distended
urinary bladder. No destructive bony lesions are noted. No distal
colonic obstruction.

Prostate gland and seminal vesicles are unremarkable. Multiple
pelvic phleboliths.

Kidneys are symmetrical in size and enhancement. No hydronephrosis
or hydroureter. There liver spleen, pancreas and adrenals are
unremarkable. Gallbladder is contracted without evidence of
calcified gallstones.
IMPRESSION: 1. There is patchy infiltrate bilateral lower lobe right greater
than left highly suspicious for aspiration pneumonia. Clinical
correlation is necessary.
2. Moderate size hiatal hernia.
3. Significant gastric distension highly suspicious for
gastroparesis. No gastric outlet obstruction.
4. Nonspecific mild distended small bowel loops without evidence of
small bowel obstruction or transition point in caliber.
5. Normal appendix.
6. Significant distension urinary bladder. No evidence of calcified
calculi.

## 2015-12-04 IMAGING — CR DG CHEST 1V PORT
1 series · 1 of 1 positions shown · non-contrast
Comparison: 06/30/2013

CLINICAL DATA: Acute respiratory distress

EXAM:
PORTABLE CHEST - 1 VIEW

[AP]
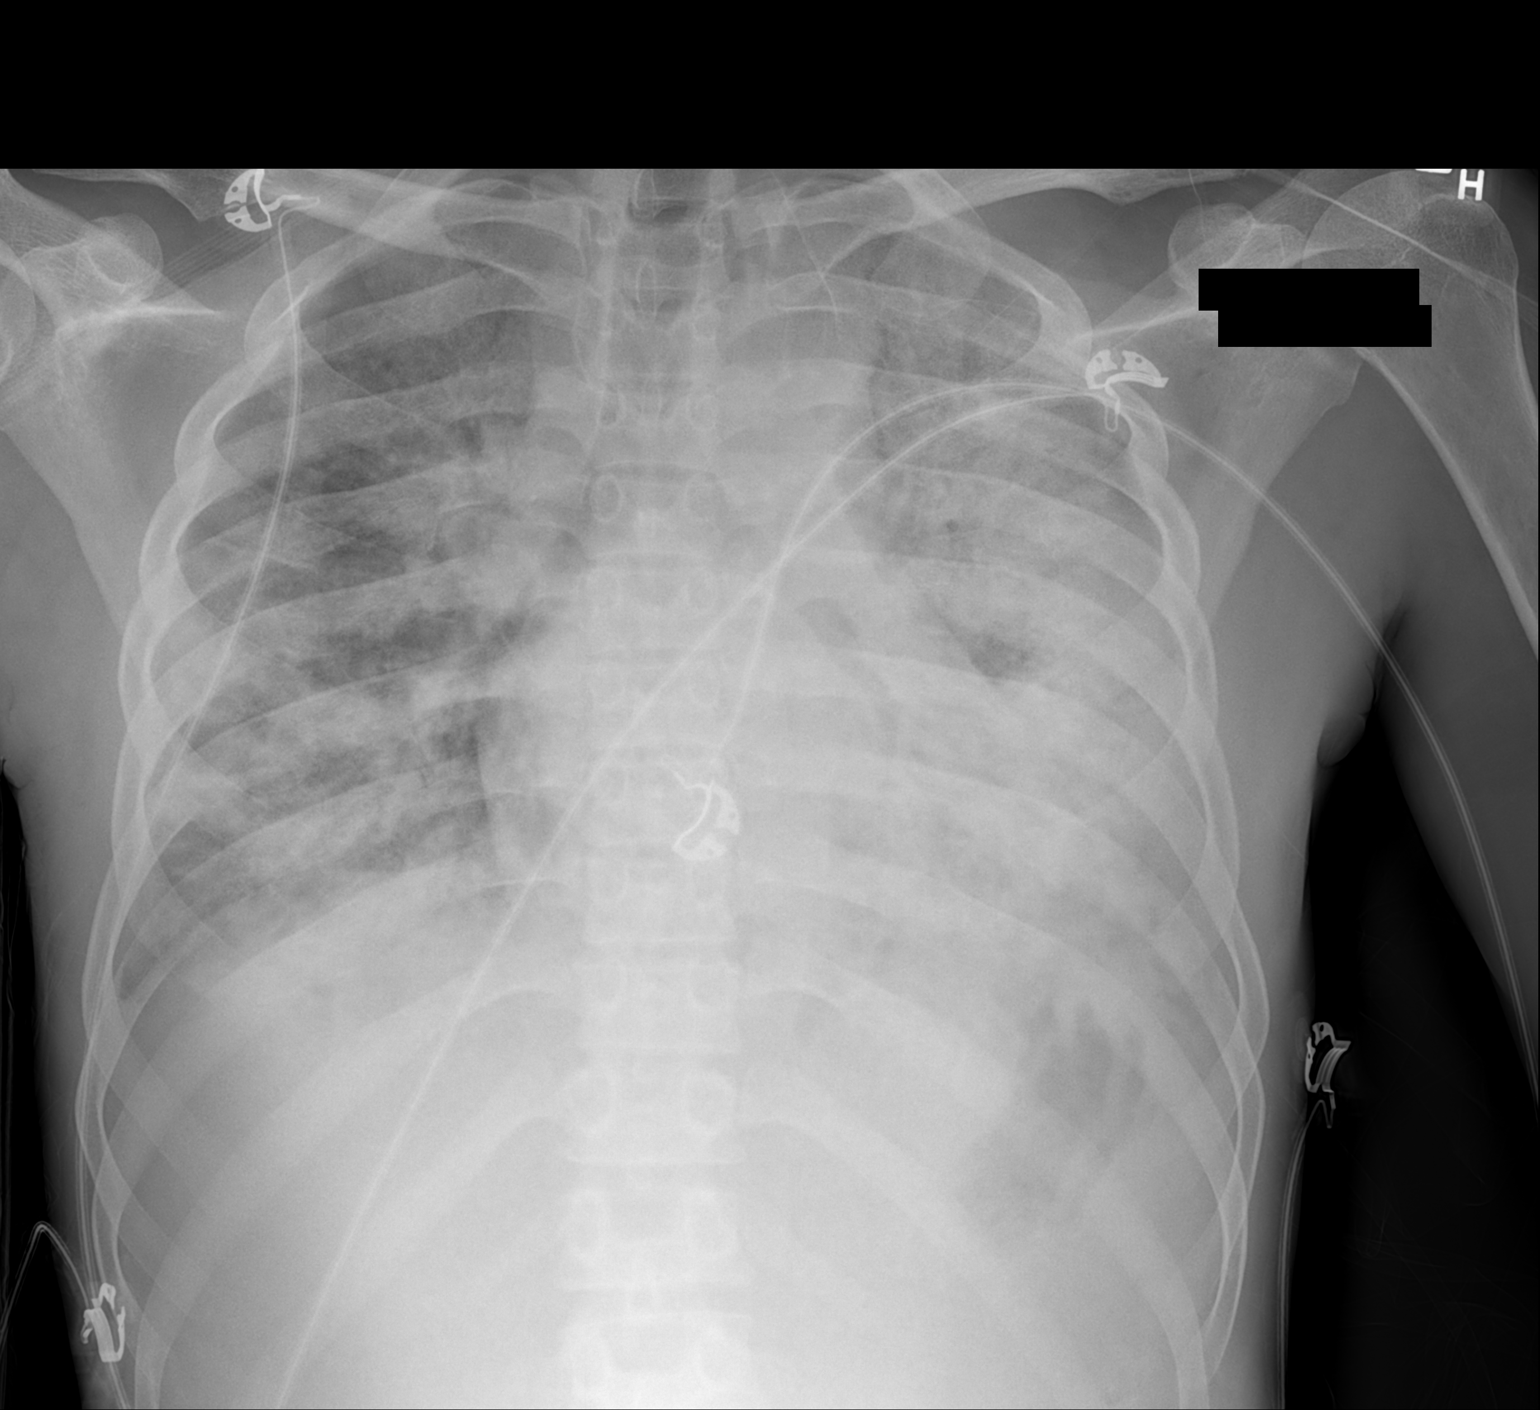

[1 of 1 positions shown; findings below may reference images not displayed]

FINDINGS: Moderate diffuse airspace opacities throughout both lungs, left
worse than right, new since previous exam. Can't exclude layering
pleural effusions. Heart size within normal limits for technique.
Old healed right clavicle fracture. Subacute left clavicle fracture
as previously demonstrated.
IMPRESSION: 1. New bilateral diffuse airspace disease may represent edema versus
pneumonia.

## 2015-12-05 ENCOUNTER — Encounter (HOSPITAL_BASED_OUTPATIENT_CLINIC_OR_DEPARTMENT_OTHER): Payer: Medicaid Other

## 2015-12-05 IMAGING — CR DG CHEST 1V PORT
1 series · 1 of 1 positions shown · non-contrast
Comparison: 09/25/2013

CLINICAL DATA: Shortness of breath.  A RDS.

EXAM:
PORTABLE CHEST - 1 VIEW

[AP]
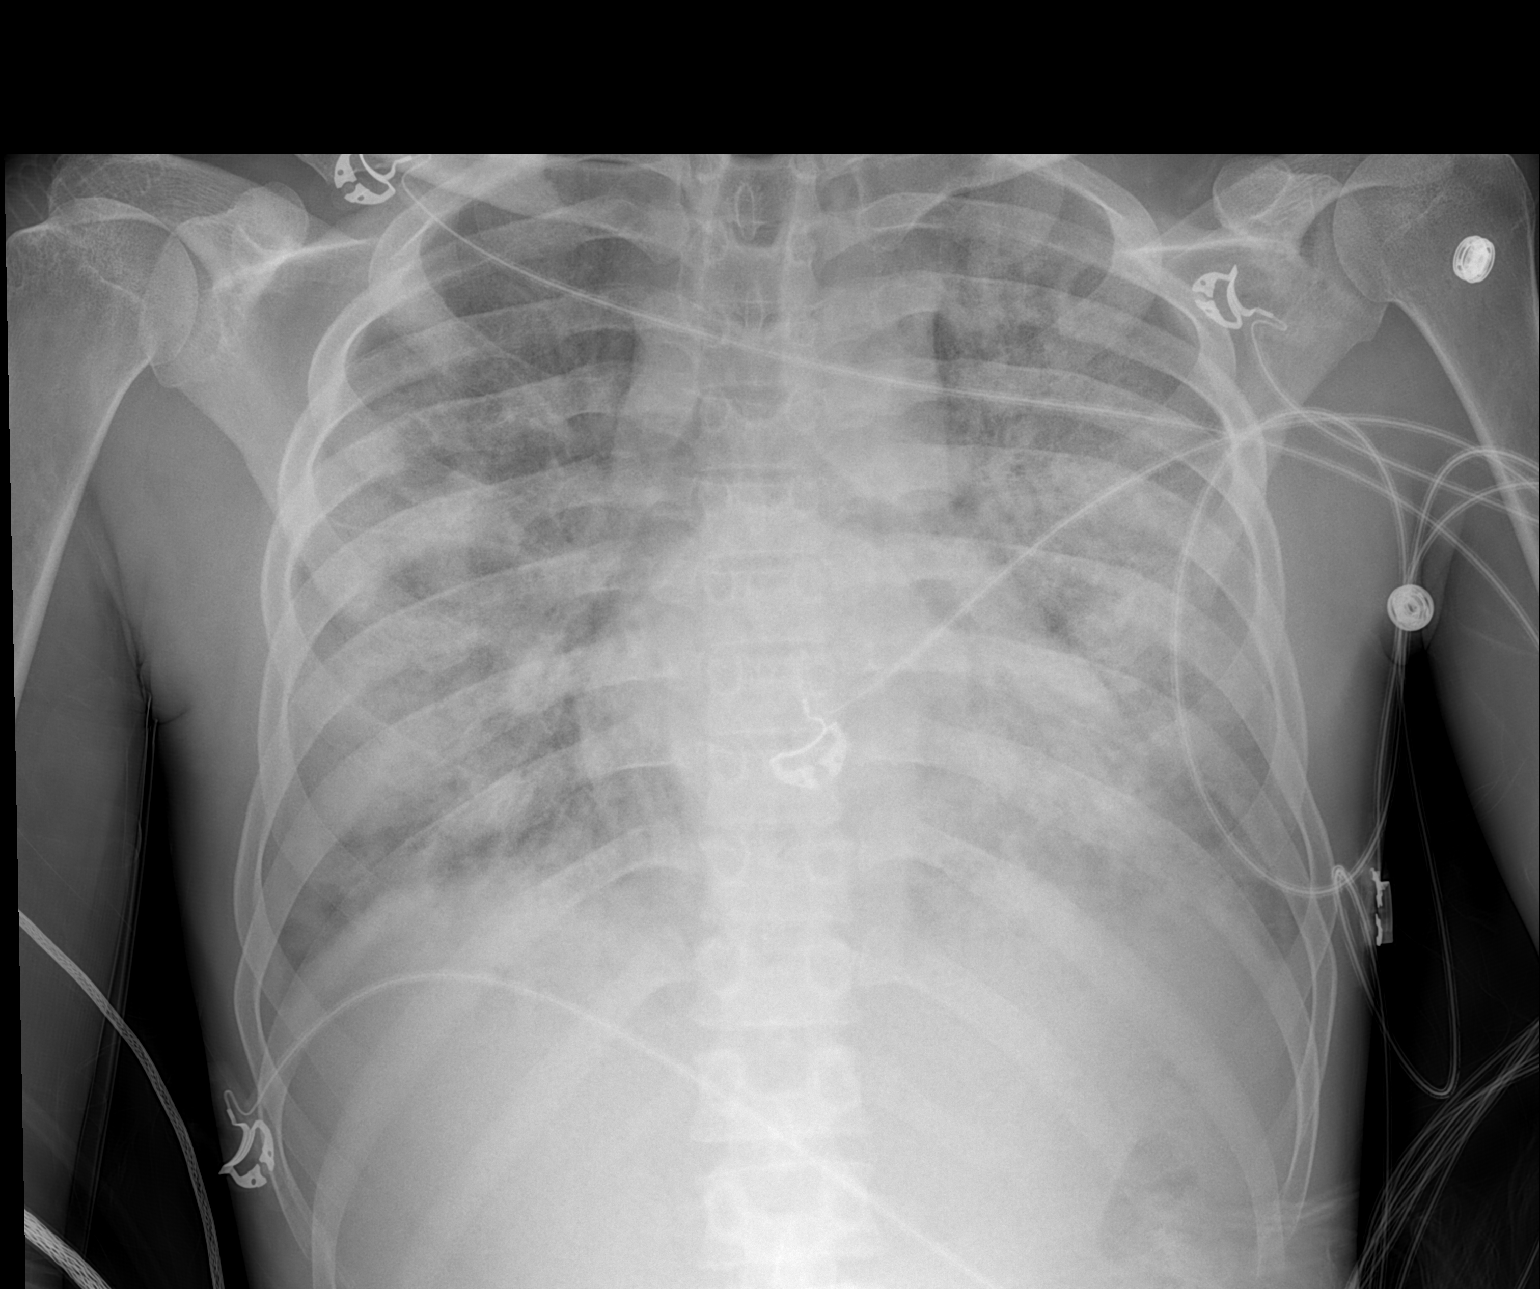

[1 of 1 positions shown; findings below may reference images not displayed]

FINDINGS: Severe diffuse bilateral airspace disease again noted, unchanged.
Heart is borderline in size. No significant visible effusions.
IMPRESSION: Stable diffuse bilateral airspace disease.

## 2015-12-06 ENCOUNTER — Telehealth: Payer: Self-pay | Admitting: Internal Medicine

## 2015-12-06 ENCOUNTER — Emergency Department (HOSPITAL_COMMUNITY): Payer: Medicaid Other

## 2015-12-06 ENCOUNTER — Inpatient Hospital Stay (HOSPITAL_COMMUNITY)
Admission: EM | Admit: 2015-12-06 | Discharge: 2016-01-05 | DRG: 870 | Disposition: A | Discharge status: Home / Self Care | Payer: Medicaid Other | Attending: Internal Medicine | Admitting: Internal Medicine

## 2015-12-06 DIAGNOSIS — K3184 Gastroparesis: Secondary | ICD-10-CM | POA: Diagnosis present

## 2015-12-06 DIAGNOSIS — J9622 Acute and chronic respiratory failure with hypercapnia: Secondary | ICD-10-CM | POA: Diagnosis not present

## 2015-12-06 DIAGNOSIS — G9340 Encephalopathy, unspecified: Secondary | ICD-10-CM | POA: Diagnosis not present

## 2015-12-06 DIAGNOSIS — J9610 Chronic respiratory failure, unspecified whether with hypoxia or hypercapnia: Secondary | ICD-10-CM | POA: Diagnosis present

## 2015-12-06 DIAGNOSIS — J95851 Ventilator associated pneumonia: Secondary | ICD-10-CM | POA: Diagnosis not present

## 2015-12-06 DIAGNOSIS — N179 Acute kidney failure, unspecified: Secondary | ICD-10-CM | POA: Diagnosis not present

## 2015-12-06 DIAGNOSIS — G894 Chronic pain syndrome: Secondary | ICD-10-CM | POA: Diagnosis present

## 2015-12-06 DIAGNOSIS — E10649 Type 1 diabetes mellitus with hypoglycemia without coma: Secondary | ICD-10-CM | POA: Diagnosis present

## 2015-12-06 DIAGNOSIS — Z87891 Personal history of nicotine dependence: Secondary | ICD-10-CM | POA: Diagnosis not present

## 2015-12-06 DIAGNOSIS — D649 Anemia, unspecified: Secondary | ICD-10-CM | POA: Diagnosis present

## 2015-12-06 DIAGNOSIS — Z8614 Personal history of Methicillin resistant Staphylococcus aureus infection: Secondary | ICD-10-CM

## 2015-12-06 DIAGNOSIS — Z430 Encounter for attention to tracheostomy: Secondary | ICD-10-CM | POA: Diagnosis not present

## 2015-12-06 DIAGNOSIS — B3749 Other urogenital candidiasis: Secondary | ICD-10-CM | POA: Diagnosis not present

## 2015-12-06 DIAGNOSIS — J9611 Chronic respiratory failure with hypoxia: Secondary | ICD-10-CM | POA: Diagnosis not present

## 2015-12-06 DIAGNOSIS — K219 Gastro-esophageal reflux disease without esophagitis: Secondary | ICD-10-CM | POA: Diagnosis present

## 2015-12-06 DIAGNOSIS — Z1624 Resistance to multiple antibiotics: Secondary | ICD-10-CM | POA: Diagnosis present

## 2015-12-06 DIAGNOSIS — J811 Chronic pulmonary edema: Secondary | ICD-10-CM | POA: Diagnosis present

## 2015-12-06 DIAGNOSIS — N319 Neuromuscular dysfunction of bladder, unspecified: Secondary | ICD-10-CM | POA: Diagnosis present

## 2015-12-06 DIAGNOSIS — J189 Pneumonia, unspecified organism: Secondary | ICD-10-CM

## 2015-12-06 DIAGNOSIS — R0600 Dyspnea, unspecified: Secondary | ICD-10-CM

## 2015-12-06 DIAGNOSIS — Z933 Colostomy status: Secondary | ICD-10-CM | POA: Diagnosis not present

## 2015-12-06 DIAGNOSIS — D7589 Other specified diseases of blood and blood-forming organs: Secondary | ICD-10-CM | POA: Diagnosis present

## 2015-12-06 DIAGNOSIS — J918 Pleural effusion in other conditions classified elsewhere: Secondary | ICD-10-CM | POA: Diagnosis not present

## 2015-12-06 DIAGNOSIS — I9589 Other hypotension: Secondary | ICD-10-CM | POA: Diagnosis not present

## 2015-12-06 DIAGNOSIS — G8250 Quadriplegia, unspecified: Secondary | ICD-10-CM | POA: Diagnosis present

## 2015-12-06 DIAGNOSIS — E43 Unspecified severe protein-calorie malnutrition: Secondary | ICD-10-CM | POA: Diagnosis present

## 2015-12-06 DIAGNOSIS — G8220 Paraplegia, unspecified: Secondary | ICD-10-CM | POA: Diagnosis not present

## 2015-12-06 DIAGNOSIS — E108 Type 1 diabetes mellitus with unspecified complications: Secondary | ICD-10-CM | POA: Diagnosis present

## 2015-12-06 DIAGNOSIS — Z9911 Dependence on respirator [ventilator] status: Secondary | ICD-10-CM | POA: Diagnosis not present

## 2015-12-06 DIAGNOSIS — J9620 Acute and chronic respiratory failure, unspecified whether with hypoxia or hypercapnia: Secondary | ICD-10-CM | POA: Diagnosis not present

## 2015-12-06 DIAGNOSIS — Z681 Body mass index (BMI) 19 or less, adult: Secondary | ICD-10-CM | POA: Diagnosis not present

## 2015-12-06 DIAGNOSIS — B965 Pseudomonas (aeruginosa) (mallei) (pseudomallei) as the cause of diseases classified elsewhere: Secondary | ICD-10-CM | POA: Diagnosis not present

## 2015-12-06 DIAGNOSIS — Z880 Allergy status to penicillin: Secondary | ICD-10-CM

## 2015-12-06 DIAGNOSIS — J9811 Atelectasis: Secondary | ICD-10-CM | POA: Diagnosis not present

## 2015-12-06 DIAGNOSIS — E1043 Type 1 diabetes mellitus with diabetic autonomic (poly)neuropathy: Secondary | ICD-10-CM | POA: Diagnosis present

## 2015-12-06 DIAGNOSIS — D638 Anemia in other chronic diseases classified elsewhere: Secondary | ICD-10-CM | POA: Diagnosis present

## 2015-12-06 DIAGNOSIS — R0602 Shortness of breath: Secondary | ICD-10-CM | POA: Diagnosis present

## 2015-12-06 DIAGNOSIS — E875 Hyperkalemia: Secondary | ICD-10-CM | POA: Diagnosis not present

## 2015-12-06 DIAGNOSIS — Y848 Other medical procedures as the cause of abnormal reaction of the patient, or of later complication, without mention of misadventure at the time of the procedure: Secondary | ICD-10-CM | POA: Diagnosis not present

## 2015-12-06 DIAGNOSIS — Z86711 Personal history of pulmonary embolism: Secondary | ICD-10-CM | POA: Diagnosis not present

## 2015-12-06 DIAGNOSIS — J9690 Respiratory failure, unspecified, unspecified whether with hypoxia or hypercapnia: Secondary | ICD-10-CM

## 2015-12-06 DIAGNOSIS — Z515 Encounter for palliative care: Secondary | ICD-10-CM | POA: Diagnosis present

## 2015-12-06 DIAGNOSIS — R451 Restlessness and agitation: Secondary | ICD-10-CM | POA: Diagnosis not present

## 2015-12-06 DIAGNOSIS — T368X5A Adverse effect of other systemic antibiotics, initial encounter: Secondary | ICD-10-CM | POA: Diagnosis not present

## 2015-12-06 DIAGNOSIS — E1065 Type 1 diabetes mellitus with hyperglycemia: Secondary | ICD-10-CM | POA: Diagnosis present

## 2015-12-06 DIAGNOSIS — L89154 Pressure ulcer of sacral region, stage 4: Secondary | ICD-10-CM | POA: Diagnosis present

## 2015-12-06 DIAGNOSIS — R509 Fever, unspecified: Secondary | ICD-10-CM | POA: Diagnosis not present

## 2015-12-06 DIAGNOSIS — R569 Unspecified convulsions: Secondary | ICD-10-CM | POA: Diagnosis present

## 2015-12-06 DIAGNOSIS — Z794 Long term (current) use of insulin: Secondary | ICD-10-CM | POA: Diagnosis not present

## 2015-12-06 DIAGNOSIS — Z87440 Personal history of urinary (tract) infections: Secondary | ICD-10-CM | POA: Diagnosis not present

## 2015-12-06 DIAGNOSIS — Z931 Gastrostomy status: Secondary | ICD-10-CM | POA: Diagnosis not present

## 2015-12-06 DIAGNOSIS — Z930 Tracheostomy status: Secondary | ICD-10-CM

## 2015-12-06 DIAGNOSIS — N390 Urinary tract infection, site not specified: Secondary | ICD-10-CM | POA: Diagnosis not present

## 2015-12-06 DIAGNOSIS — E872 Acidosis: Secondary | ICD-10-CM | POA: Diagnosis present

## 2015-12-06 DIAGNOSIS — L8990 Pressure ulcer of unspecified site, unspecified stage: Secondary | ICD-10-CM | POA: Diagnosis present

## 2015-12-06 DIAGNOSIS — J9600 Acute respiratory failure, unspecified whether with hypoxia or hypercapnia: Secondary | ICD-10-CM | POA: Diagnosis not present

## 2015-12-06 DIAGNOSIS — Y95 Nosocomial condition: Secondary | ICD-10-CM | POA: Diagnosis present

## 2015-12-06 DIAGNOSIS — F411 Generalized anxiety disorder: Secondary | ICD-10-CM | POA: Diagnosis present

## 2015-12-06 DIAGNOSIS — R1310 Dysphagia, unspecified: Secondary | ICD-10-CM | POA: Diagnosis present

## 2015-12-06 DIAGNOSIS — I959 Hypotension, unspecified: Secondary | ICD-10-CM | POA: Diagnosis present

## 2015-12-06 DIAGNOSIS — E871 Hypo-osmolality and hyponatremia: Secondary | ICD-10-CM | POA: Insufficient documentation

## 2015-12-06 DIAGNOSIS — J151 Pneumonia due to Pseudomonas: Secondary | ICD-10-CM | POA: Diagnosis present

## 2015-12-06 DIAGNOSIS — J9621 Acute and chronic respiratory failure with hypoxia: Secondary | ICD-10-CM | POA: Diagnosis present

## 2015-12-06 DIAGNOSIS — L988 Other specified disorders of the skin and subcutaneous tissue: Secondary | ICD-10-CM | POA: Diagnosis present

## 2015-12-06 DIAGNOSIS — A419 Sepsis, unspecified organism: Principal | ICD-10-CM | POA: Diagnosis present

## 2015-12-06 DIAGNOSIS — J45909 Unspecified asthma, uncomplicated: Secondary | ICD-10-CM | POA: Diagnosis present

## 2015-12-06 DIAGNOSIS — M797 Fibromyalgia: Secondary | ICD-10-CM | POA: Diagnosis present

## 2015-12-06 DIAGNOSIS — R140 Abdominal distension (gaseous): Secondary | ICD-10-CM | POA: Diagnosis present

## 2015-12-06 DIAGNOSIS — D72829 Elevated white blood cell count, unspecified: Secondary | ICD-10-CM | POA: Diagnosis not present

## 2015-12-06 DIAGNOSIS — J690 Pneumonitis due to inhalation of food and vomit: Secondary | ICD-10-CM | POA: Diagnosis present

## 2015-12-06 DIAGNOSIS — J9691 Respiratory failure, unspecified with hypoxia: Secondary | ICD-10-CM | POA: Diagnosis present

## 2015-12-06 DIAGNOSIS — I808 Phlebitis and thrombophlebitis of other sites: Secondary | ICD-10-CM | POA: Diagnosis not present

## 2015-12-06 LAB — URINE MICROSCOPIC-ADD ON

## 2015-12-06 LAB — COMPREHENSIVE METABOLIC PANEL
ALK PHOS: 102 U/L (ref 38–126)
ALT: 10 U/L — AB (ref 17–63)
AST: 15 U/L (ref 15–41)
Albumin: 2.6 g/dL — ABNORMAL LOW (ref 3.5–5.0)
Anion gap: 9 (ref 5–15)
BUN: 27 mg/dL — AB (ref 6–20)
CALCIUM: 12.7 mg/dL — AB (ref 8.9–10.3)
CO2: 18 mmol/L — AB (ref 22–32)
CREATININE: 0.94 mg/dL (ref 0.61–1.24)
Chloride: 103 mmol/L (ref 101–111)
GFR calc non Af Amer: >60 mL/min (ref >60)
GLUCOSE: 217 mg/dL — AB (ref 65–99)
Potassium: 4.8 mmol/L (ref 3.5–5.1)
SODIUM: 130 mmol/L — AB (ref 135–145)
Total Bilirubin: 0.4 mg/dL (ref 0.3–1.2)
Total Protein: 8.6 g/dL — ABNORMAL HIGH (ref 6.5–8.1)

## 2015-12-06 LAB — URINALYSIS, ROUTINE W REFLEX MICROSCOPIC
BILIRUBIN URINE: NEGATIVE
GLUCOSE, UA: NEGATIVE mg/dL
KETONES UR: NEGATIVE mg/dL
Nitrite: NEGATIVE
PH: 5 (ref 5.0–8.0)
Protein, ur: >300 mg/dL — AB
SPECIFIC GRAVITY, URINE: 1.013 (ref 1.005–1.030)

## 2015-12-06 LAB — CBC WITH DIFFERENTIAL/PLATELET
Basophils Absolute: 0.1 10*3/uL (ref 0.0–0.1)
Basophils Relative: 0 %
EOS ABS: 0.2 10*3/uL (ref 0.0–0.7)
EOS PCT: 1 %
HCT: 25.4 % — ABNORMAL LOW (ref 39.0–52.0)
Hemoglobin: 7.5 g/dL — ABNORMAL LOW (ref 13.0–17.0)
LYMPHS PCT: 15 %
Lymphs Abs: 3.4 10*3/uL (ref 0.7–4.0)
MCH: 23.4 pg — AB (ref 26.0–34.0)
MCHC: 29.5 g/dL — AB (ref 30.0–36.0)
MCV: 79.1 fL (ref 78.0–100.0)
MONOS PCT: 3 %
Monocytes Absolute: 0.8 10*3/uL (ref 0.1–1.0)
Neutro Abs: 17.8 10*3/uL — ABNORMAL HIGH (ref 1.7–7.7)
Neutrophils Relative %: 81 %
PLATELETS: 752 10*3/uL — AB (ref 150–400)
RBC: 3.21 MIL/uL — ABNORMAL LOW (ref 4.22–5.81)
RDW: 14.4 % (ref 11.5–15.5)
WBC: 22.2 10*3/uL — ABNORMAL HIGH (ref 4.0–10.5)

## 2015-12-06 LAB — I-STAT CG4 LACTIC ACID, ED
Lactic Acid, Venous: 1.42 mmol/L (ref 0.5–2.0)
Lactic Acid, Venous: 0.68 mmol/L (ref 0.5–2.0)

## 2015-12-06 IMAGING — CR DG CHEST 1V PORT
1 series · 1 of 1 positions shown · non-contrast
Comparison: Chest x-ray 09/26/2013.

CLINICAL DATA: Respiratory failure.

EXAM:
PORTABLE CHEST - 1 VIEW

[AP]
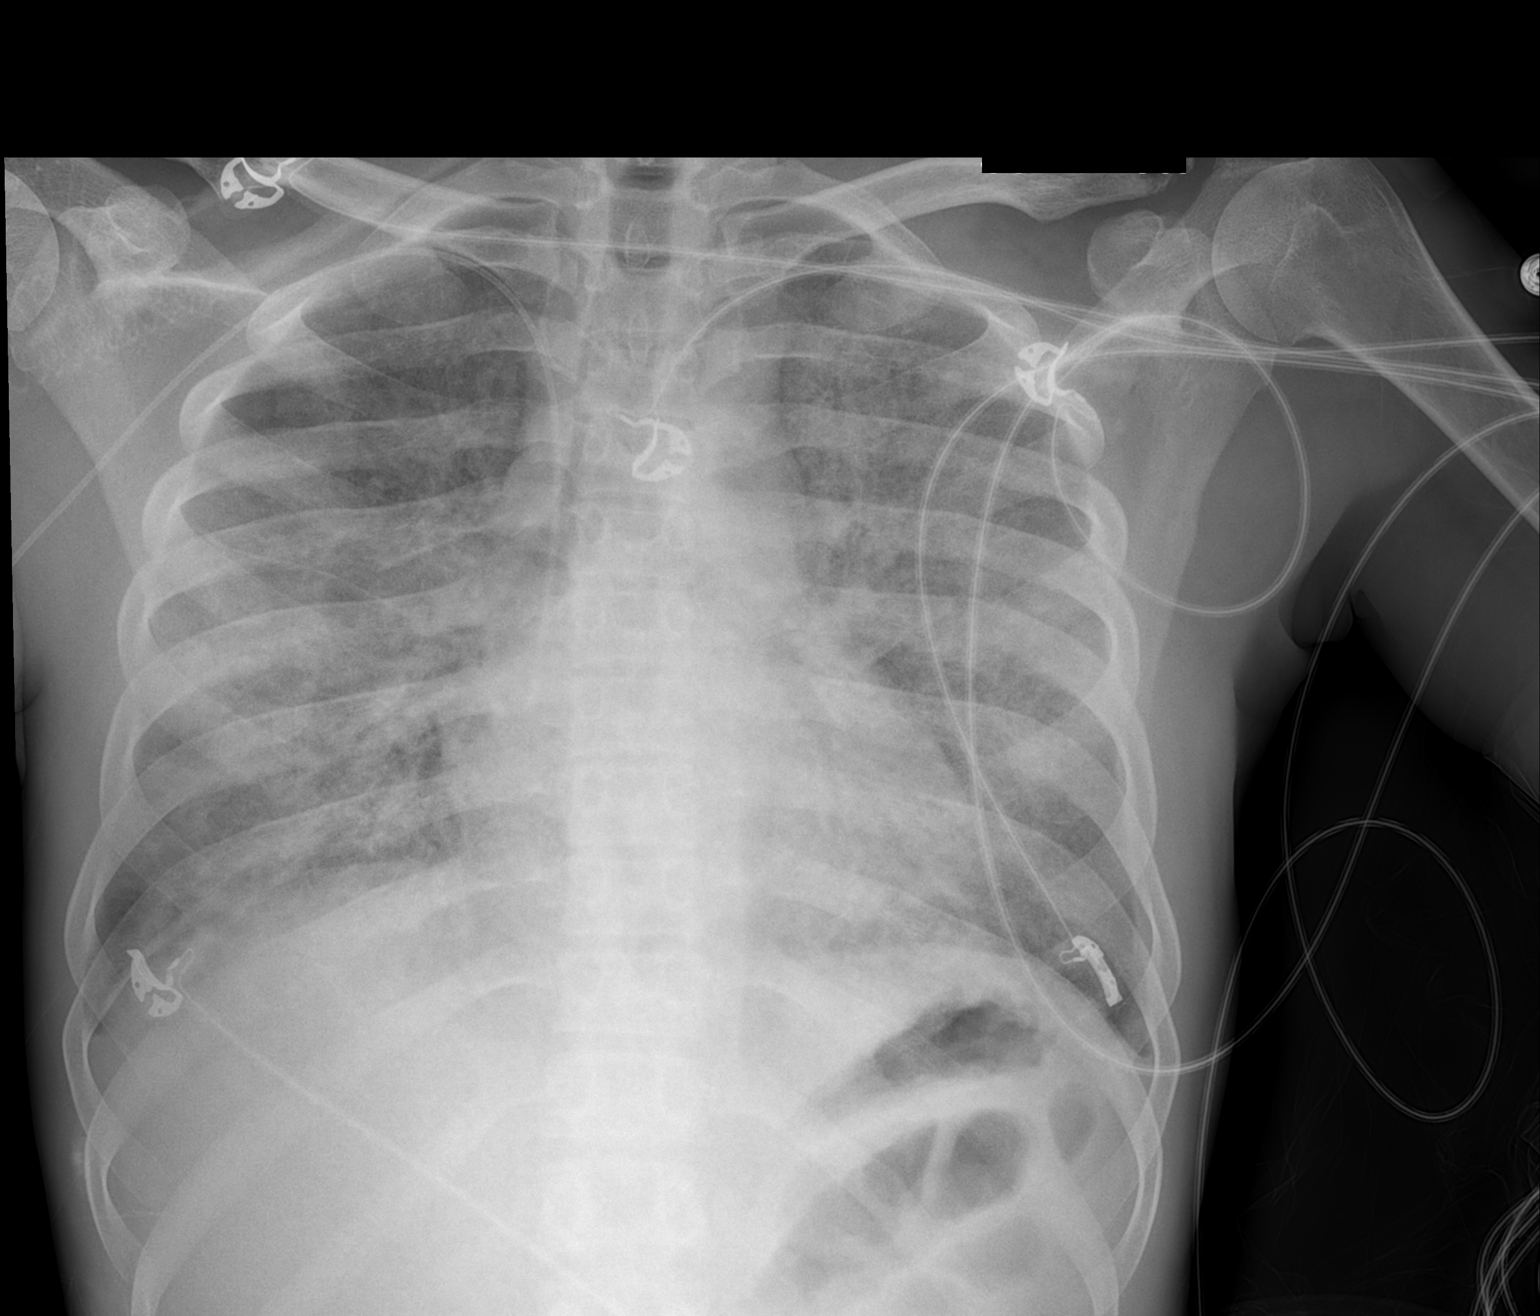

[1 of 1 positions shown; findings below may reference images not displayed]

FINDINGS: There is a right upper extremity PICC with tip terminating in the
superior cavoatrial junction. Lung volumes are slightly low. There
continues to be widespread airspace disease throughout the lungs
bilaterally. No definite pleural effusions. Pulmonary vasculature is
obscured by overlying airspace disease. Heart size appears normal.
Mediastinal contours are unremarkable.
IMPRESSION: 1. Tip of new right upper extremity PICC is at the superior
cavoatrial junction.
2. Persistent bilateral multifocal diffuse airspace opacities appear
very similar to yesterday's examination. Although this could be seen
in the setting of pulmonary edema, lack of cardiomegaly suggests
against this, and the pattern is favored to reflect either
multilobar pneumonia, or other diffuse process such as alveolar
hemorrhage. Clinical correlation is recommended.

## 2015-12-06 MED ORDER — SODIUM CHLORIDE 0.9 % IV SOLN
500.0000 mg | Freq: Once | INTRAVENOUS | Status: AC
  Administered 2015-12-06: 500 mg via INTRAVENOUS
  Filled 2015-12-06: qty 500

## 2015-12-06 MED ORDER — FENTANYL CITRATE (PF) 100 MCG/2ML IJ SOLN
25.0000 ug | INTRAMUSCULAR | Status: DC | PRN
  Administered 2015-12-07: 50 ug via INTRAVENOUS
  Administered 2015-12-08 (×3): 25 ug via INTRAVENOUS
  Filled 2015-12-06 (×4): qty 2

## 2015-12-06 MED ORDER — LEVOFLOXACIN IN D5W 750 MG/150ML IV SOLN
750.0000 mg | INTRAVENOUS | Status: DC
  Administered 2015-12-07 – 2015-12-12 (×7): 750 mg via INTRAVENOUS
  Filled 2015-12-06 (×7): qty 150

## 2015-12-06 MED ORDER — PANTOPRAZOLE SODIUM 40 MG IV SOLR
40.0000 mg | Freq: Every day | INTRAVENOUS | Status: DC
  Administered 2015-12-07 – 2015-12-13 (×8): 40 mg via INTRAVENOUS
  Filled 2015-12-06 (×7): qty 40

## 2015-12-06 MED ORDER — ALBUTEROL SULFATE (2.5 MG/3ML) 0.083% IN NEBU: 2.5000 mg | INHALATION_SOLUTION | RESPIRATORY_TRACT | Status: DC | PRN

## 2015-12-06 MED ORDER — SODIUM CHLORIDE 0.9 % IV SOLN
500.0000 mg | Freq: Four times a day (QID) | INTRAVENOUS | Status: DC
  Filled 2015-12-06 (×2): qty 500

## 2015-12-06 MED ORDER — INSULIN ASPART 100 UNIT/ML ~~LOC~~ SOLN
0.0000 [IU] | SUBCUTANEOUS | Status: DC
  Administered 2015-12-07: 3 [IU] via SUBCUTANEOUS
  Administered 2015-12-07: 8 [IU] via SUBCUTANEOUS
  Administered 2015-12-07: 3 [IU] via SUBCUTANEOUS
  Administered 2015-12-07 – 2015-12-08 (×3): 5 [IU] via SUBCUTANEOUS
  Administered 2015-12-08 (×2): 2 [IU] via SUBCUTANEOUS
  Filled 2015-12-06 (×2): qty 1

## 2015-12-06 MED ORDER — SODIUM CHLORIDE 0.9 % IV SOLN
INTRAVENOUS | Status: DC
  Administered 2015-12-07: 50 mL/h via INTRAVENOUS
  Administered 2015-12-07: 17:00:00 via INTRAVENOUS
  Administered 2015-12-07: 75 mL/h via INTRAVENOUS
  Administered 2015-12-08: 01:00:00 via INTRAVENOUS

## 2015-12-06 MED ORDER — HEPARIN SODIUM (PORCINE) 5000 UNIT/ML IJ SOLN
5000.0000 [IU] | Freq: Three times a day (TID) | INTRAMUSCULAR | Status: DC
  Administered 2015-12-07 – 2016-01-05 (×89): 5000 [IU] via SUBCUTANEOUS
  Filled 2015-12-06 (×90): qty 1

## 2015-12-06 MED ORDER — VANCOMYCIN HCL 500 MG IV SOLR
500.0000 mg | Freq: Two times a day (BID) | INTRAVENOUS | Status: DC
  Administered 2015-12-07 – 2015-12-12 (×13): 500 mg via INTRAVENOUS
  Filled 2015-12-06 (×17): qty 500

## 2015-12-06 MED ORDER — SODIUM CHLORIDE 0.9 % IV SOLN: 250.0000 mL | INTRAVENOUS | Status: DC | PRN

## 2015-12-06 MED ORDER — MIDODRINE HCL 5 MG PO TABS
20.0000 mg | ORAL_TABLET | Freq: Three times a day (TID) | ORAL | Status: DC
Start: 2015-12-07 — End: 2015-12-18
  Administered 2015-12-07 – 2015-12-18 (×35): 20 mg
  Filled 2015-12-06 (×38): qty 4

## 2015-12-06 NOTE — ED Notes (Signed)
Contacted lab for CBC; main lab does not have a CBC; phlebotomy contacted for  Recollect

## 2015-12-06 NOTE — ED Notes (Signed)
Pt in from home per family pt has had a fever x 1 mth with increased SOB onset today, pt presents to ED with ventilator,  A&O x4, follows commands, pt has wound vac in place on sacral pressure ulcer

## 2015-12-06 NOTE — H&P (Signed)
PULMONARY / CRITICAL CARE MEDICINE   Name: Jason [name] MRN: [id number] DOB: [birth year]    ADMISSION DATE:  12/06/2015 CONSULTATION DATE:  12/06/15  REFERRING MD:  EDP  CHIEF COMPLAINT:  Decrease in O2 sats  HISTORY OF PRESENT ILLNESS:  Pt is on chronic vent; therefore, this HPI is obtained from chart review. Jason [name] is a 30 y.o. 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  He is followed in trach clinic and he has been doing well and has not required any ventilator support for quite some time, last time roughly November 2016.  He had been able to use PMV 24 hours a day without problem.  On 12/06/15, he was brought to [facility] ED due to drop in O2 saturations.  This began 4 - 5 days prior and got to low of 88%.  Mother began to put him on the vent for 2 - 4 hours at a time in hopes of improving O2 sats.  She also noticed that his heart rate would intermittently climb into the low 100's.  He has had a low grade fever, but mom reports this has been going on for over a month now.  Denies any cough.  He does have a hx of tracheitis and mom states a few months ago he had problems with excessive secretions.  Over the past 4 - 5 days, secretions have not been to bad and mom has not been able to suction much from trach.  In ED, he had saturations in high 90's on his home vent settings.  CXR revealed bibasilar densities concerning for PNA.  PCCM was subsequently called for admission.   PAST MEDICAL HISTORY :  He  has a past medical history of GERD (gastroesophageal reflux disease); Asthma; MRSA infection; Gastroparesis; Diabetic neuropathy ([place]); Seizures ([place]); Family history of anesthesia complication; Dysrhythmia; Pneumonia; Arthritis; Fibromyalgia; Stroke ([place]) (01/29/2014); Type I diabetes mellitus ([place]); Syncope (02/16/2015); Multiple allergies (11/02/2015); and Recurrent UTI (11/02/2015).  PAST SURGICAL  HISTORY: He  has past surgical history that includes Tonsillectomy; Multiple extractions with alveoloplasty (N/A, 08/03/2014); TEE without cardioversion (N/A, 08/17/2014); Debridment of decubitus ulcer (N/A, 10/04/2014); Laparoscopic diverted colostomy (N/A, 10/12/2014); Insertion of suprapubic catheter (N/A, 10/12/2014); Incision and drainage of wound (N/A, 11/12/2014); Application of a-cell of back (N/A, 11/12/2014); Incision and drainage of wound (N/A, 11/18/2014); Incision and drainage of wound (N/A, 11/25/2014); Minor application of wound vac (N/A, 11/25/2014); Incision and drainage of wound (Right, 12/08/2014); Application of a-cell of back (N/A, 12/08/2014); TEE without cardioversion (N/A, 05/05/2015); Incision and drainage of wound (Bilateral, 05/05/2015); Hematoma evacuation (N/A, 05/05/2015); Incision and drainage of wound (N/A, [id number]); and Application of a-cell of chest/abdomen (N/A, 08/04/2015).  Allergies  Allergen Reactions  . Cefuroxime Axetil Anaphylaxis  . Ertapenem Other (See Comments)    Rash and confusion-->tolerated Imipenem   . Morphine And Related Other (See Comments)    Changed mental status, confusion, headache, visual hallucination  . Penicillins Anaphylaxis and Other (See Comments)    Tolerated Imipenem; no reaction to 7 day course of amoxicillin in 2015 Has patient had a PCN reaction causing immediate rash, facial/tongue/throat swelling, SOB or lightheadedness with hypotension: Yes Has patient had a PCN reaction causing severe rash involving mucus membranes or skin necrosis: No Has patient had a PCN reaction that required hospitalization Yes Has patient had a PCN reaction occurring within the last 10 years: No If all of the above answers are "NO", then  may proceed with Cephalo  . Sulfa Antibiotics Anaphylaxis, Shortness Of Breath and Other (See Comments)  . Tessalon [Benzonatate] Anaphylaxis  . Shellfish Allergy Itching and Other (See Comments)    Took benadryl to alleviate  reaction  . Nsaids Other (See Comments)    Risk of bleeding  . Miripirium Rash and Other (See Comments)    Change in mental status    No current facility-administered medications on file prior to encounter.   Current Outpatient Prescriptions on File Prior to Encounter  Medication Sig  . acetaminophen (TYLENOL) 325 MG tablet Take 2 tablets (650 mg total) by mouth every 6 (six) hours as needed for mild pain, moderate pain, fever or headache.  . albuterol (PROVENTIL) (2.5 MG/3ML) 0.083% nebulizer solution Take 3 mLs (2.5 mg total) by nebulization every 4 (four) hours as needed for wheezing or shortness of breath.  . baclofen (LIORESAL) 20 MG tablet Take 1 tablet (20 mg total) by mouth 4 (four) times daily.  Marland Kitchen BISACODYL 5 MG EC tablet Take ONE (1) tablet by mouth twice daily as needed FOR MODERATE CONSTIPATION  . buPROPion (WELLBUTRIN XL) 150 MG 24 hr tablet Take 1 tablet (150 mg total) by mouth daily.  . camphor-menthol (SARNA) lotion Apply topically as needed for itching.  . collagenase (SANTYL) ointment Apply 1 application topically every Monday, Wednesday, and Friday. Apply to necrotic tissue in the sacral wound only.  . diclofenac sodium (VOLTAREN) 1 % GEL Apply 2 g topically 2 (two) times daily.  Marland Kitchen docusate (COLACE) 50 MG/5ML liquid Take 10 mLs (100 mg total) by mouth 2 (two) times daily.  . fentaNYL (DURAGESIC - DOSED MCG/HR) 50 MCG/HR Place 1 patch (50 mcg total) onto the skin every 3 (three) days.  . ferrous sulfate 325 (65 FE) MG tablet Take 1 tablet (325 mg total) by mouth daily with breakfast.  . glucagon (GLUCAGON EMERGENCY) 1 MG injection INJECT INTO MUSCLE ONCE AS NEEDED (Patient taking differently: Inject 1 mg into the muscle once as needed (low blood sugar). )  . GLUCERNA (GLUCERNA) LIQD 1,000 mLs by PEG Tube route continuous. 70 ml/hr  . insulin aspart (NOVOLOG FLEXPEN) 100 UNIT/ML FlexPen Inject 0-10 Units into the skin 4 (four) times daily as needed for high blood sugar (CBG  >200). Per sliding scale: CBG <200 0 units, 201-300 4 units, 301-400 6 units, >400 10 units  . insulin glargine (LANTUS) 100 UNIT/ML injection Inject 0.15 mLs (15 Units total) into the skin at bedtime.  Marland Kitchen loratadine (CLARITIN) 10 MG tablet Take 1 tablet (10 mg total) by mouth daily.  Marland Kitchen LORazepam (ATIVAN) 0.5 MG tablet Take 0.5 mg by mouth 3 (three) times daily as needed for anxiety. Reported on 11/02/2015  . Magnesium Oxide 400 MG CAPS Take 1 capsule (400 mg total) by mouth 2 (two) times daily.  . midodrine (PROAMATINE) 10 MG tablet Take 2 tablets (20 mg total) by mouth 3 (three) times daily with meals.  . Multiple Vitamin (MULTIVITAMIN WITH MINERALS) TABS tablet Place 1 tablet into feeding tube daily. (Patient taking differently: 1 tablet by PEG Tube route daily. )  . mupirocin ointment (BACTROBAN) 2 % Apply to affected area twice a day as needed  . ondansetron (ZOFRAN ODT) 8 MG disintegrating tablet Place 1 tablet (8 mg total) into feeding tube every 8 (eight) hours as needed for nausea or vomiting. (Patient taking differently: 8 mg by PEG Tube route every 8 (eight) hours as needed for nausea or vomiting. )  . Oxycodone HCl 20  MG TABS Take 1 tablet (20 mg total) by mouth every 6 (six) hours as needed (pain).  . pantoprazole sodium (PROTONIX) 40 mg/20 mL PACK Place 20 mLs (40 mg total) into feeding tube daily. (Patient taking differently: 40 mg by PEG Tube route daily. )  . pregabalin (LYRICA) 100 MG capsule Place 1 capsule (100 mg total) into feeding tube 3 (three) times daily. Take 100 mg by mouth in the morning, take 100 mg by mouth in the afternoon, take 100 mg by mouth in the evening and take 200 mg by mouth at bedtime.  . saccharomyces boulardii (FLORASTOR) 250 MG capsule Take 1 capsule (250 mg total) by mouth 2 (two) times daily.  . Water For Irrigation, Sterile (FREE WATER) SOLN Place 200 mLs into feeding tube every 8 (eight) hours.  Marland Kitchen ACCU-CHEK SMARTVIEW test strip Use to check blood sugar 4  times daily.  . ciprofloxacin (CIPRO) 500 MG tablet Take 1 tablet (500 mg total) by mouth 2 (two) times daily. (Patient not taking: Reported on 12/06/2015)  . doxycycline (VIBRA-TABS) 100 MG tablet Take 1 tablet (100 mg total) by mouth 2 (two) times daily. (Patient not taking: Reported on 12/06/2015)  . fosfomycin (MONUROL) 3 G PACK Take 3 g by mouth as directed. Give patient a 3 gram dose today. (Patient not taking: Reported on 12/06/2015)  . SURE COMFORT INS SYR 1CC/29G 29G X 1/2" 1 ML MISC Use As Directed    FAMILY HISTORY:  His indicated that his mother is alive. He indicated that his father is alive.   SOCIAL HISTORY: He  reports that he quit smoking about 14 months ago. His smoking use included Cigars and Cigarettes. He has a 12 pack-year smoking history. He has never used smokeless tobacco. He reports that he does not drink alcohol or use illicit drugs.  REVIEW OF SYSTEMS:   Unable to obtain as pt unable to verbalize on vent.  SUBJECTIVE:  On full vent support.  Vitals stable.  VITAL SIGNS: BP 134/82 mmHg  Pulse 102  Temp(Src) 100 F (37.8 C) (Oral)  Resp 13  Ht 5\' 8"  (1.727 m)  SpO2 97%  HEMODYNAMICS:    VENTILATOR SETTINGS:    INTAKE / OUTPUT:     PHYSICAL EXAMINATION: General: Chronically ill appearing young male, in NAD. Neuro: Awake, nods head appropriately in response to questions. HEENT: [place]/AT. PERRL, sclerae anicteric. Cardiovascular: RRR, no M/R/G.  Lungs: Respirations even and unlabored.  Coarse bilaterally L > R. Abdomen:PEG in place, BS x 4, soft, NT/ND.  Musculoskeletal: Muscle wasting bilaterally.  Bilateral ankles in boots. No peripheral edema.  Skin: Intact, warm, no rashes.  LABS:  BMET  Recent Labs Lab 12/06/15 2008  NA 130*  K 4.8  CL 103  CO2 18*  BUN 27*  CREATININE 0.94  GLUCOSE 217*    Electrolytes  Recent Labs Lab 12/06/15 2008  CALCIUM 12.7*    CBC  Recent Labs Lab 12/06/15 2236  WBC 22.2*  HGB 7.5*  HCT 25.4*   PLT 752*    Coag's No results for input(s): APTT, INR in the last 168 hours.  Sepsis Markers  Recent Labs Lab 12/06/15 2027 12/06/15 2254  LATICACIDVEN 1.42 0.68    ABG No results for input(s): PHART, PCO2ART, PO2ART in the last 168 hours.  Liver Enzymes  Recent Labs Lab 12/06/15 2008  AST 15  ALT 10*  ALKPHOS 102  BILITOT 0.4  ALBUMIN 2.6*    Cardiac Enzymes No results for input(s): TROPONINI, PROBNP in  the last 168 hours.  Glucose No results for input(s): GLUCAP in the last 168 hours.  Imaging Dg Chest Portable 1 View  12/06/2015  CLINICAL DATA:  30 year old male with sepsis and decreased O2 sats. 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 There are bibasilar opacities with silhouetting of the diaphragm, concerning for pneumonia. There has been interval increase in the left lung base density compared to the prior study. Small bilateral pleural effusions may be present. The cardiac silhouette is within normal limits. No acute osseous pathology. IMPRESSION: Bibasilar airspace densities concerning for pneumonia. Clinical correlation and follow-up recommended. Electronically Signed   By: Anner Crete M.D.   On: 12/06/2015 19:59     STUDIES:  CXR 02/28 > bibasilar airspace densities concerning for PNA.  CULTURES: Blood 02/28 > Urine 02/28 > Sputum 02/28 >  ANTIBIOTICS: Vanc 02/28 >  Levaquin 02/28 >  SIGNIFICANT EVENTS: 02/28 > admitted with probable HCAP.  LINES/TUBES: Chronic trach / PEG >   ASSESSMENT / PLAN:  PULMONARY A: 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 Probable HCAP vs tracheitis. P:   Full vent support, would keep on vent tonight then attempt ATC and possibly PMV in AM (per mom, pt has been on PMV 24/7 for quite some time prior to this). VAP prevention measures. Abx / cultures per ID section. Albuterol  PRN. Chest PT. CXR in AM.  CARDIOVASCULAR A:  Chronic hypotension - on midodrine. P:  Continue outpatient midodrine. Assess lactate.  RENAL A:   Hyponatremia - chronic. P:   NS @ 75. BMP in AM.  GASTROINTESTINAL A:   GERD, gastroparesis. Nutrition. P:   Pantoprazole. NPO. Nutrition consult for TF's.  HEMATOLOGIC A:   Anemia - chronic. Thrombocytosis - chronic. VTE Prophylaxis. P:  Transfuse for Hgb < 7. SCD's / heparin. CBC in AM.  INFECTIOUS A:   UTI - hx pseudomonal UTI in past with resistance to multiple abx. Probable HCAP vs tracheitis. Hx sacral wounds. P:   Abx as above (vanc / levaquin).  Follow cultures as above. PCT algorithm to limit abx exposure. Wound care consult. Day team to please consult infectious disease.  ENDOCRINE A:   DM. P:   SSI.  NEUROLOGIC A:   Hx seizures (not on meds), paraplegia, fibromyalgia, diabetic neuropathy. P:   Fentanyl PRN pain. Hold outpatient bupropion, lorazepam, lyrica, fentanyl patch.    Family updated: Mother at bedside.  Interdisciplinary Family Meeting v Palliative Care Meeting:  Due by: 12/12/15.  CC time: 40 minutes.   Montey Hora, [place] [facility] Pager: [phone]  or [phone] 12/06/2015, 11:02 PM

## 2015-12-06 NOTE — Telephone Encounter (Signed)
Called and spoke with Hildred Alamin at [facility]. She states the pt's oxygen was in the low 80's and 90's. He received nebs and suctioning and his O2 stats raised to 92%. She states he was placed on the vent for 2 hours today and has had a low grade fever of 100.0. The patient is being seen at the trach clinic tomorrow with Dr. Titus Mould and Hildred Alamin requested that he be made aware. Pt's mother is also requesting that an order for PRN oxygen be placed and a CXR ordered tomorrow at the visit. I explained to her that I would send a message to Dr. Titus Mould and make him aware. She voiced understanding and had no further questions.  Called Dr. Titus Mould and LVM on his cell phone that message was being routed to him as an FYI. Pt is scheduled at the trach clinic on 12/07/15 at 11am. Will send message to Dr. Titus Mould as Juluis Rainier

## 2015-12-06 NOTE — Progress Notes (Addendum)
Pharmacy Antibiotic Note  Jason [name] is a 30 y.o. male admitted on 12/06/2015 with pneumonia.  Pharmacy has been consulted for Vancocin and Levaquin dosing.  Plan: Vancomycin 500 IV every 12 hours.  Goal trough 15-20 mcg/mL. (based on prior admissions w/ vanc trough) Levaquin 750mg  IV every 24 hours.  Height: 5\' 8"  (172.7 cm) IBW/kg (Calculated) : 68.4  Temp (24hrs), Avg:100 F (37.8 C), Min:100 F (37.8 C), Max:100 F (37.8 C)   Recent Labs Lab 12/06/15 2008 12/06/15 2027 12/06/15 2236 12/06/15 2254  WBC  --   --  22.2*  --   CREATININE 0.94  --   --   --   LATICACIDVEN  --  1.42  --  0.68     Allergies  Allergen Reactions  . Cefuroxime Axetil Anaphylaxis  . Ertapenem Other (See Comments)    Rash and confusion-->tolerated Imipenem   . Morphine And Related Other (See Comments)    Changed mental status, confusion, headache, visual hallucination  . Penicillins Anaphylaxis and Other (See Comments)    Tolerated Imipenem; no reaction to 7 day course of amoxicillin in 2015 Has patient had a PCN reaction causing immediate rash, facial/tongue/throat swelling, SOB or lightheadedness with hypotension: Yes Has patient had a PCN reaction causing severe rash involving mucus membranes or skin necrosis: No Has patient had a PCN reaction that required hospitalization Yes Has patient had a PCN reaction occurring within the last 10 years: No If all of the above answers are "NO", then may proceed with Cephalo  . Sulfa Antibiotics Anaphylaxis, Shortness Of Breath and Other (See Comments)  . Tessalon [Benzonatate] Anaphylaxis  . Shellfish Allergy Itching and Other (See Comments)    Took benadryl to alleviate reaction  . Nsaids Other (See Comments)    Risk of bleeding  . Miripirium Rash and Other (See Comments)    Change in mental status     Thank you for allowing pharmacy to be a part of this patient's care.  Wynona Neat, PharmD, BCPS  12/06/2015 11:04 PM

## 2015-12-06 NOTE — ED Provider Notes (Signed)
CSN: [id number]     Arrival date & time 12/06/15  1851 History   First MD Initiated Contact with Patient 12/06/15 1854     Chief Complaint  Patient presents with  . Fever  . 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 According to the caregiver and family member who are here with him today they report that over the last couple of weeks she has had some intermittent difficulty with excessive secretions, gradual worsening of shortness of breath over the last several days and is now requiring more ventilatory support more frequently, he is stoma using oxygen but they do report some decreased oxygen saturations going as low as 89% at times.  Patient is a 30 y.o. male presenting with fever and shortness of breath. The history is provided by the patient.  Fever Shortness of Breath Associated symptoms: fever     Past Medical History  Diagnosis Date  . GERD (gastroesophageal reflux disease)   . Asthma   . Hx MRSA infection     on face  . Gastroparesis   . Diabetic neuropathy ([place])   . Seizures ([place])   . Family history of anesthesia complication     Pt mother can't have epidural procedures  . Dysrhythmia   . Pneumonia   . Arthritis   . Fibromyalgia   . Stroke ([place]) 01/29/2014    spinal stroke ; "quadriplegic since"  . Type I diabetes mellitus ([place])     sees Dr. Loanne Drilling   . Syncope 02/16/2015  . Multiple allergies 11/02/2015  . Recurrent UTI 11/02/2015   Past Surgical History  Procedure Laterality Date  . Tonsillectomy    . Multiple extractions with alveoloplasty N/A  08/03/2014    Procedure: MULTIPLE EXTRACTIONS;  Surgeon: Gae Bon, DDS;  Location: [place];  Service: Oral Surgery;  Laterality: N/A;  . Tee without cardioversion N/A 08/17/2014    Procedure: TRANSESOPHAGEAL ECHOCARDIOGRAM (TEE);  Surgeon: Dorothy Spark, MD;  Location: [facility] ENDOSCOPY;  Service: Cardiovascular;  Laterality: N/A;  . Debridment of decubitus ulcer N/A 10/04/2014    Procedure: DEBRIDMENT OF DECUBITUS ULCER;  Surgeon: Georganna Skeans, MD;  Location: [place];  Service: General;  Laterality: N/A;  . Laparoscopic diverted colostomy N/A 10/12/2014    Procedure: LAPAROSCOPIC DIVERTING COLOSTOMY;  Surgeon: Donnie Mesa, MD;  Location: [place];  Service: General;  Laterality: N/A;  . Insertion of suprapubic catheter N/A 10/12/2014    Procedure: INSERTION OF SUPRAPUBIC CATHETER;  Surgeon: Reece Packer, MD;  Location: [place];  Service: Urology;  Laterality: N/A;  . Incision and drainage of wound N/A 11/12/2014    Procedure: IRRIGATION AND DEBRIDEMENT OF WOUNDS WITH BONE BIOPSY AND SURGICAL PREP ;  Surgeon: Theodoro Kos, DO;  Location: [facility];  Service: Plastics;  Laterality: N/A;  . Application of a-cell of back N/A 11/12/2014    Procedure: APPLICATION A CELL AND VAC ;  Surgeon: Theodoro Kos, DO;  Location: [place];  Service: Plastics;  Laterality: N/A;  . Incision and drainage of wound N/A 11/18/2014    Procedure: IRRIGATION AND DEBRIDEMENT OF SACRAL ULCER ONLY WITH PLACEMENT OF A CELL AND VAC/ DRESSING  CHANGE TO UPPER BACK AREA.;  Surgeon: Theodoro Kos, DO;  Location: [place];  Service: Plastics;  Laterality: N/A;  . Incision and drainage of wound N/A 11/25/2014    Procedure: IRRIGATION AND DEBRIDEMENT OF SACRAL ULCER AND BACK BURN WITH PLACEMENT OF A-CELL;  Surgeon: Theodoro Kos, DO;  Location: [place];  Service: Plastics;  Laterality: N/A;  . Minor application of wound vac N/A 11/25/2014    Procedure:  WOUND VAC CHANGE;  Surgeon: Theodoro Kos, DO;  Location: [place];  Service: Plastics;  Laterality:  N/A;  . Incision and drainage of wound Right 12/08/2014    Procedure: IRRIGATION AND DEBRIDEMENT SACRAL WOUND AND RIGHT ISCHIAL WOUND ;  Surgeon: Theodoro Kos, DO;  Location: [place];  Service: Plastics;  Laterality: Right;  . Application of a-cell of back N/A 12/08/2014    Procedure: APPLICATION OF A-CELL AND WOUND VAC ;  Surgeon: Theodoro Kos, DO;  Location: [place];  Service: Plastics;  Laterality: N/A;  . Tee without cardioversion N/A 05/05/2015    Procedure: TRANSESOPHAGEAL ECHOCARDIOGRAM (TEE);  Surgeon: Lelon Perla, MD;  Location: [place];  Service: Cardiovascular;  Laterality: N/A;  . Incision and drainage of wound Bilateral 05/05/2015    Procedure: IRRIGATION AND DEBRIDEMENT SACRAL ULCER;  Surgeon: Theodoro Kos, DO;  Location: [place];  Service: Plastics;  Laterality: Bilateral;  . Hematoma evacuation N/A 05/05/2015    Procedure: EVACUATION HEMATOMA bedside procedure;  Surgeon: Theodoro Kos, DO;  Location: [place];  Service: Plastics;  Laterality: N/A;  . Incision and drainage of wound N/A 08/04/2015    Procedure: IRRIGATION AND DEBRIDEMENT OF SACRAL ULCER AND ;  Surgeon: Loel Lofty Dillingham, DO;  Location: [place];  Service: Plastics;  Laterality: N/A;  . Application of a-cell of chest/abdomen N/A 08/04/2015    Procedure: APPLICATION OF A-CELL ;  Surgeon: Loel Lofty Dillingham, DO;  Location: [place];  Service: Plastics;  Laterality: N/A;   Family History  Problem Relation Age of Onset  . Diabetes Father   . Hypertension Father   . Asthma      fhx  . Hypertension      fhx  . Stroke      fhx  . Heart disease Mother    Social History  Substance Use Topics  . Smoking status: Former Smoker -- 1.00 packs/day for 12 years    Types: Cigars, Cigarettes    Quit date: 09/07/2014  . Smokeless tobacco: Never Used  . Alcohol Use: No    Review of Systems  Constitutional: Positive for fever.  Respiratory: Positive for shortness of breath.   All other systems reviewed and are  negative.     Allergies  Cefuroxime axetil; Ertapenem; Morphine and related; Penicillins; Sulfa antibiotics; Tessalon; Shellfish allergy; Nsaids; and Miripirium  Home Medications   Prior to Admission medications   Medication Sig Start Date End Date Taking? Authorizing Provider  acetaminophen (TYLENOL) 325 MG tablet Take 2 tablets (650 mg total) by mouth every 6 (six) hours as needed for mild pain, moderate pain, fever or headache. 01/14/15  Yes Geradine Girt, DO  albuterol (PROVENTIL) (2.5 MG/3ML) 0.083% nebulizer solution Take 3 mLs (2.5 mg total) by nebulization every 4 (four) hours as needed for wheezing or shortness of breath. 04/27/14  Yes [name], MD  baclofen (LIORESAL) 20 MG tablet Take 1 tablet (20 mg total) by mouth 4 (four) times daily. 09/20/15  Yes Meredith Staggers, MD  BISACODYL 5 MG EC tablet Take ONE (1) tablet by  mouth twice daily as needed FOR MODERATE CONSTIPATION 11/21/15  Yes [name], MD  buPROPion (WELLBUTRIN XL) 150 MG 24 hr tablet Take 1 tablet (150 mg total) by mouth daily. 11/29/15  Yes [name], MD  camphor-menthol [facility]) lotion Apply topically as needed for itching. 09/02/15  Yes Shanker Kristeen Mans, MD  collagenase [facility] Needs) ointment Apply 1 application topically every Monday, Wednesday, and Friday. Apply to necrotic tissue in the sacral wound only. 10/11/15  Yes [name], MD  diclofenac sodium (VOLTAREN) 1 % GEL Apply 2 g topically 2 (two) times daily. 09/27/15  Yes [name], MD  docusate (COLACE) 50 MG/5ML liquid Take 10 mLs (100 mg total) by mouth 2 (two) times daily. 11/10/15  Yes [name], MD  fentaNYL (DURAGESIC - DOSED MCG/HR) 50 MCG/HR Place 1 patch (50 mcg total) onto the skin every 3 (three) days. 11/25/15  Yes [name], MD  ferrous sulfate 325 (65 FE) MG tablet Take 1 tablet (325 mg total) by mouth daily with breakfast. 09/27/15  Yes [name], MD  glucagon (GLUCAGON EMERGENCY) 1 MG injection INJECT INTO MUSCLE ONCE AS  NEEDED Patient taking differently: Inject 1 mg into the muscle once as needed (low blood sugar).  07/05/15  Yes Renato Shin, MD  GLUCERNA (GLUCERNA) LIQD 1,000 mLs by PEG Tube route continuous. 70 ml/hr   Yes Historical Provider, MD  insulin aspart (NOVOLOG FLEXPEN) 100 UNIT/ML FlexPen Inject 0-10 Units into the skin 4 (four) times daily as needed for high blood sugar (CBG >200). Per sliding scale: CBG <200 0 units, 201-300 4 units, 301-400 6 units, >400 10 units   Yes Historical Provider, MD  insulin glargine (LANTUS) 100 UNIT/ML injection Inject 0.15 mLs (15 Units total) into the skin at bedtime. 08/16/15  Yes Janece Canterbury, MD  loratadine (CLARITIN) 10 MG tablet Take 1 tablet (10 mg total) by mouth daily. 11/25/15  Yes [name], MD  LORazepam (ATIVAN) 0.5 MG tablet Take 0.5 mg by mouth 3 (three) times daily as needed for anxiety. Reported on 11/02/2015   Yes Historical Provider, MD  Magnesium Oxide 400 MG CAPS Take 1 capsule (400 mg total) by mouth 2 (two) times daily. 09/27/15  Yes [name], MD  midodrine (PROAMATINE) 10 MG tablet Take 2 tablets (20 mg total) by mouth 3 (three) times daily with meals. 10/28/15  Yes [name], MD  Multiple Vitamin (MULTIVITAMIN WITH MINERALS) TABS tablet Place 1 tablet into feeding tube daily. Patient taking differently: 1 tablet by PEG Tube route daily.  06/01/15  Yes Erick Colace, NP  mupirocin ointment (BACTROBAN) 2 % Apply to affected area twice a day as needed 11/21/15  Yes [name], MD  ondansetron (ZOFRAN ODT) 8 MG disintegrating tablet Place 1 tablet (8 mg total) into feeding tube every 8 (eight) hours as needed for nausea or vomiting. Patient taking differently: 8 mg by PEG Tube route every 8 (eight) hours as needed for nausea or vomiting.  06/01/15  Yes Erick Colace, NP  Oxycodone HCl 20 MG TABS Take 1 tablet (20 mg total) by mouth every 6 (six) hours as needed (pain). 11/02/15  Yes [name], MD  pantoprazole sodium (PROTONIX) 40  mg/20 mL PACK Place 20 mLs (40 mg total) into feeding tube daily. Patient taking differently: 40 mg by PEG Tube route daily.  06/01/15  Yes Erick Colace, NP  pregabalin (LYRICA) 100 MG capsule Place 1 capsule (100  mg total) into feeding tube 3 (three) times daily. Take 100 mg by mouth in the morning, take 100 mg by mouth in the afternoon, take 100 mg by mouth in the evening and take 200 mg by mouth at bedtime. 08/19/15  Yes [name], MD  saccharomyces boulardii (FLORASTOR) 250 MG capsule Take 1 capsule (250 mg total) by mouth 2 (two) times daily. 08/16/15  Yes Janece Canterbury, MD  Water For Irrigation, Sterile (FREE WATER) SOLN Place 200 mLs into feeding tube every 8 (eight) hours. 10/11/15  Yes [name], MD  ACCU-CHEK SMARTVIEW test strip Use to check blood sugar 4 times daily. 10/11/15   Renato Shin, MD  ciprofloxacin (CIPRO) 500 MG tablet Take 1 tablet (500 mg total) by mouth 2 (two) times daily. Patient not taking: Reported on 12/06/2015 11/07/15   Truman Hayward, MD  doxycycline (VIBRA-TABS) 100 MG tablet Take 1 tablet (100 mg total) by mouth 2 (two) times daily. Patient not taking: Reported on 12/06/2015 10/04/15   Truman Hayward, MD  fosfomycin [facility]) 3 G PACK Take 3 g by mouth as directed. Give patient a 3 gram dose today. Patient not taking: Reported on 12/06/2015 09/16/15   Truman Hayward, MD  SURE COMFORT INS SYR 1CC/29G 29G X 1/2" 1 ML MISC Use As Directed 11/04/15   [name], MD   BP 134/82 mmHg  Pulse 102  Temp(Src) 100 F (37.8 C) (Oral)  Resp 13  Ht 5\' 8"  (1.727 m)  SpO2 97% Physical Exam  Constitutional: He appears well-developed and well-nourished. No distress.  HENT:  Head: Normocephalic and atraumatic.  Mouth/Throat: Oropharynx is clear and moist. No oropharyngeal exudate.  Eyes: Conjunctivae and EOM are normal. Pupils are equal, round, and reactive to light. Right eye exhibits no discharge. Left eye exhibits no discharge. No scleral icterus.   Neck: Normal range of motion. Neck supple. No JVD present. No thyromegaly present.  Cardiovascular: Regular rhythm, normal heart sounds and intact distal pulses.  Exam reveals no gallop and no friction rub.   No murmur heard. Mild tachycardia  Pulmonary/Chest: Effort normal and breath sounds normal. No respiratory distress. He has no wheezes. He has no rales.  Rhonchi throughout, no increased work of breathing, ventilator assisting respirations at this time  Abdominal: Soft. Bowel sounds are normal. He exhibits no distension and no mass. There is no tenderness.  Musculoskeletal: Normal range of motion. He exhibits no edema or tenderness.  Lymphadenopathy:    He has no cervical adenopathy.  Neurological: He is alert. Coordination normal.  Flaccid lower extremities, able to move both of his upper extremities  Skin: Skin is warm and dry. No rash noted. No erythema.  Psychiatric: He has a normal mood and affect. His behavior is normal.  Nursing note and vitals reviewed.   ED Course  Procedures (including critical care time) Labs Review Labs Reviewed  COMPREHENSIVE METABOLIC PANEL - Abnormal; Notable for the following:    Sodium 130 (*)    CO2 18 (*)    Glucose, Bld 217 (*)    BUN 27 (*)    Calcium 12.7 (*)    Total Protein 8.6 (*)    Albumin 2.6 (*)    ALT 10 (*)    All other components within normal limits  URINALYSIS, ROUTINE W REFLEX MICROSCOPIC (NOT AT [facility]) - Abnormal; Notable for the following:    APPearance CLOUDY (*)    Hgb urine dipstick SMALL (*)  Protein, ur >300 (*)    Leukocytes, UA MODERATE (*)    All other components within normal limits  CBC WITH DIFFERENTIAL/PLATELET - Abnormal; Notable for the following:    WBC 22.2 (*)    RBC 3.21 (*)    Hemoglobin 7.5 (*)    HCT 25.4 (*)    MCH 23.4 (*)    MCHC 29.5 (*)    Platelets 752 (*)    Neutro Abs 17.8 (*)    All other components within normal limits  URINE MICROSCOPIC-ADD ON - Abnormal; Notable for the  following:    Squamous Epithelial / LPF 0-5 (*)    Bacteria, UA MANY (*)    Casts HYALINE CASTS (*)    All other components within normal limits  CULTURE, BLOOD (ROUTINE X 2)  CULTURE, BLOOD (ROUTINE X 2)  URINE CULTURE  CULTURE, BLOOD (ROUTINE X 2)  CULTURE, BLOOD (ROUTINE X 2)  CULTURE, RESPIRATORY (NON-EXPECTORATED)  URINE CULTURE  CBC  CBC  BASIC METABOLIC PANEL  BLOOD GAS, ARTERIAL  MAGNESIUM  PHOSPHORUS  PROCALCITONIN  PROCALCITONIN  LACTIC ACID, PLASMA  I-STAT CG4 LACTIC ACID, ED  I-STAT CG4 LACTIC ACID, ED    Imaging Review Dg Chest Portable 1 View  12/06/2015  CLINICAL DATA:  30 year old male with sepsis and decreased O2 sats. 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 There are bibasilar opacities with silhouetting of the diaphragm, concerning for pneumonia. There has been interval increase in the left lung base density compared to the prior study. Small bilateral pleural effusions may be present. The cardiac silhouette is within normal limits. No acute osseous pathology. IMPRESSION: Bibasilar airspace densities concerning for pneumonia. Clinical correlation and follow-up recommended. Electronically Signed   By: Anner Crete M.D.   On: 12/06/2015 19:59   I have personally reviewed and evaluated these images and lab results as part of my medical decision-making.   MDM   Final diagnoses:  Sepsis, due to unspecified organism ([place])  HCAP (healthcare-associated pneumonia)    The trach site does appear to have some purulent material around it, he is not in distress, he has abnormal lung sounds but this could be related to either rhonchi or developing pneumonia, at this time he has a borderline fever, would suggest any further workup with labs, x-ray and potentially CT scan if no evidence of direct source is found. I would also consider pulmonary embolus given the patient's  immobile state and increase shortness of breath.  The patient has evidence of bilateral lower lobe pneumonias, he does have a fever, he is persistently tachycardic to 108 or 110 bpm, his oxygen saturations have been going down at home and is now requiring ventilatory support. I will discuss his care with intensive care unit physician. The hospital states that all ventilated patient needs to go to the ICU. He has had copious amounts of purulent sputum while in the emergency department. I suspect a possible aspiration event. Labs reviewed, chest x-ray reviewed, I agree that this is likely pneumonia and less likely PE  D/w Pharmacy - Primaxin abx D/w ICU attending Dr. Ashby Dawes - will admit to ICU  CRITICAL CARE Performed by: [facility] Total critical care time: 35 minutes Critical care time was exclusive of separately billable procedures and treating other patients. Critical care was necessary to treat or prevent imminent or life-threatening deterioration. Critical care was time spent personally by me on the following activities: development of treatment  plan with patient and/or surrogate as well as nursing, discussions with consultants, evaluation of patient's response to treatment, examination of patient, obtaining history from patient or surrogate, ordering and performing treatments and interventions, ordering and review of laboratory studies, ordering and review of radiographic studies, pulse oximetry and re-evaluation of patient's condition.   Meds given in ED:  Medications  heparin injection 5,000 Units (not administered)  0.9 %  sodium chloride infusion (not administered)  pantoprazole (PROTONIX) injection 40 mg (not administered)  0.9 %  sodium chloride infusion (not administered)  albuterol (PROVENTIL) (2.5 MG/3ML) 0.083% nebulizer solution 2.5 mg (not administered)  fentaNYL (SUBLIMAZE) injection 25-50 mcg (not administered)  midodrine (PROAMATINE) tablet 20 mg (not  administered)  vancomycin (VANCOCIN) 500 mg in sodium chloride 0.9 % 100 mL IVPB (not administered)  insulin aspart (novoLOG) injection 0-15 Units (not administered)  levofloxacin (LEVAQUIN) IVPB 750 mg (not administered)  imipenem-cilastatin (PRIMAXIN) 500 mg in sodium chloride 0.9 % 100 mL IVPB (0 mg Intravenous Stopped 12/06/15 2335)    New Prescriptions   No medications on file      Noemi Chapel, MD 12/07/15 [phone]

## 2015-12-07 ENCOUNTER — Inpatient Hospital Stay (HOSPITAL_COMMUNITY): Admission: RE | Admit: 2015-12-07 | Payer: Medicaid Other | Source: Ambulatory Visit

## 2015-12-07 ENCOUNTER — Inpatient Hospital Stay (HOSPITAL_COMMUNITY): Payer: Medicaid Other

## 2015-12-07 DIAGNOSIS — L8990 Pressure ulcer of unspecified site, unspecified stage: Secondary | ICD-10-CM

## 2015-12-07 DIAGNOSIS — Z1624 Resistance to multiple antibiotics: Secondary | ICD-10-CM

## 2015-12-07 DIAGNOSIS — A419 Sepsis, unspecified organism: Secondary | ICD-10-CM | POA: Insufficient documentation

## 2015-12-07 DIAGNOSIS — Y95 Nosocomial condition: Secondary | ICD-10-CM

## 2015-12-07 DIAGNOSIS — J9600 Acute respiratory failure, unspecified whether with hypoxia or hypercapnia: Secondary | ICD-10-CM

## 2015-12-07 DIAGNOSIS — J9620 Acute and chronic respiratory failure, unspecified whether with hypoxia or hypercapnia: Secondary | ICD-10-CM

## 2015-12-07 LAB — I-STAT ARTERIAL BLOOD GAS, ED
Acid-base deficit: 7 mmol/L — ABNORMAL HIGH (ref 0.0–2.0)
BICARBONATE: 17.6 meq/L — AB (ref 20.0–24.0)
O2 Saturation: 91 %
PCO2 ART: 30.5 mmHg — AB (ref 35.0–45.0)
TCO2: 19 mmol/L (ref 0–100)
pH, Arterial: 7.369 (ref 7.350–7.450)
pO2, Arterial: 61 mmHg — ABNORMAL LOW (ref 80.0–100.0)

## 2015-12-07 LAB — CBC
HEMATOCRIT: 24.6 % — AB (ref 39.0–52.0)
Hemoglobin: 7.4 g/dL — ABNORMAL LOW (ref 13.0–17.0)
MCH: 23.4 pg — AB (ref 26.0–34.0)
MCHC: 30.1 g/dL (ref 30.0–36.0)
MCV: 77.8 fL — AB (ref 78.0–100.0)
Platelets: 717 10*3/uL — ABNORMAL HIGH (ref 150–400)
RBC: 3.16 MIL/uL — ABNORMAL LOW (ref 4.22–5.81)
RDW: 14.2 % (ref 11.5–15.5)
WBC: 23.6 10*3/uL — AB (ref 4.0–10.5)

## 2015-12-07 LAB — BASIC METABOLIC PANEL
Anion gap: 12 (ref 5–15)
BUN: 29 mg/dL — AB (ref 6–20)
CHLORIDE: 101 mmol/L (ref 101–111)
CO2: 19 mmol/L — ABNORMAL LOW (ref 22–32)
Calcium: 12.8 mg/dL — ABNORMAL HIGH (ref 8.9–10.3)
Creatinine, Ser: 1.14 mg/dL (ref 0.61–1.24)
GFR calc Af Amer: >60 mL/min (ref >60)
GFR calc non Af Amer: >60 mL/min (ref >60)
Glucose, Bld: 254 mg/dL — ABNORMAL HIGH (ref 65–99)
POTASSIUM: 5 mmol/L (ref 3.5–5.1)
SODIUM: 132 mmol/L — AB (ref 135–145)

## 2015-12-07 LAB — MRSA PCR SCREENING: MRSA by PCR: POSITIVE — AB

## 2015-12-07 LAB — PROCALCITONIN: Procalcitonin: 6.53 ng/mL

## 2015-12-07 LAB — CBG MONITORING, ED
GLUCOSE-CAPILLARY: 177 mg/dL — AB (ref 65–99)
Glucose-Capillary: 274 mg/dL — ABNORMAL HIGH (ref 65–99)

## 2015-12-07 LAB — LACTIC ACID, PLASMA: Lactic Acid, Venous: 1 mmol/L (ref 0.5–2.0)

## 2015-12-07 LAB — GLUCOSE, CAPILLARY
GLUCOSE-CAPILLARY: 243 mg/dL — AB (ref 65–99)
GLUCOSE-CAPILLARY: 167 mg/dL — AB (ref 65–99)

## 2015-12-07 LAB — PHOSPHORUS: Phosphorus: 3.5 mg/dL (ref 2.5–4.6)

## 2015-12-07 LAB — MAGNESIUM: MAGNESIUM: 1.7 mg/dL (ref 1.7–2.4)

## 2015-12-07 IMAGING — CR DG ABDOMEN 1V
1 series · 1 of 1 positions shown · non-contrast
Comparison: Abdomen and pelvis CT 09/22/2013 and abdominal
radiographs 06/30/2013

CLINICAL DATA: NG tube placement.

EXAM:
ABDOMEN - 1 VIEW

[AP]
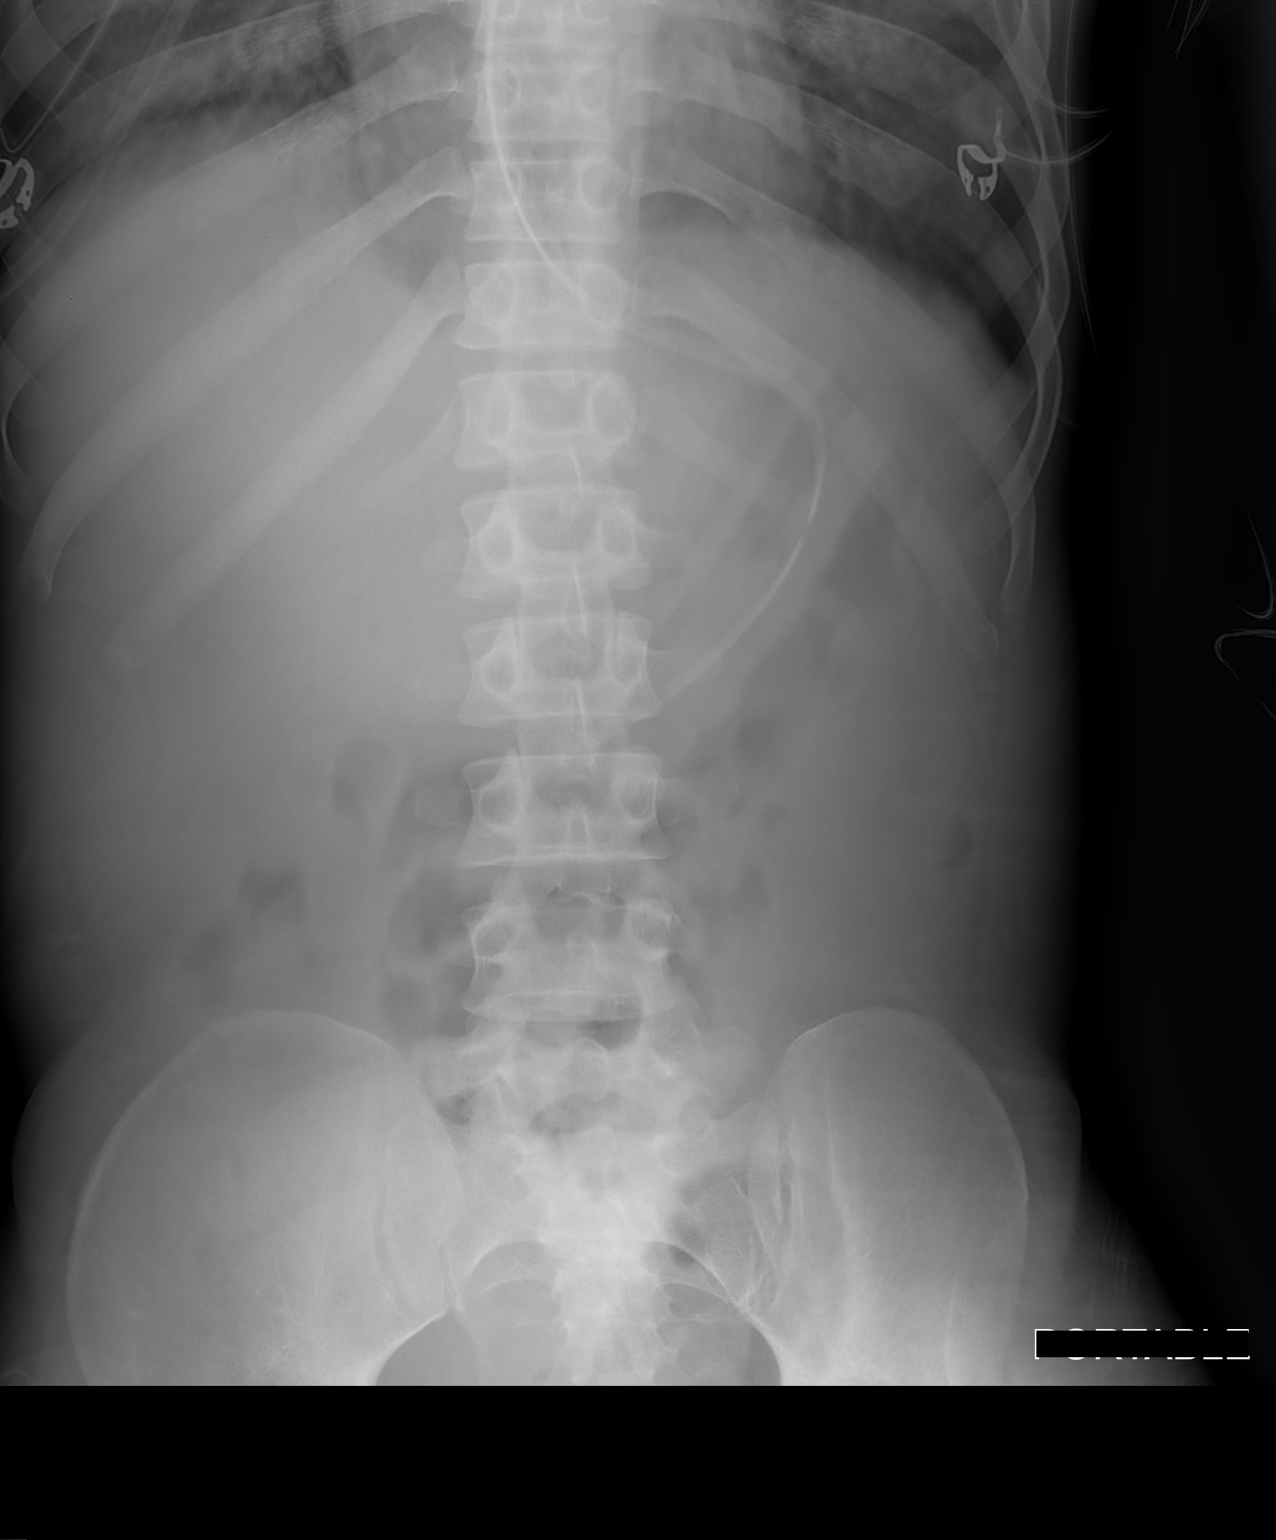

[1 of 1 positions shown; findings below may reference images not displayed]

FINDINGS: Enteric tube is present with tip and side hole overlying the gastric
body. Gas is seen in nondilated loops of small bowel in the central
abdomen. Left-sided pelvic phleboliths are partially visualized. No
acute osseous abnormality is seen. Visualized lung bases demonstrate
hazy opacities, more fully evaluated on chest radiograph earlier the
same day.
IMPRESSION: Enteric tube overlying the stomach.

## 2015-12-07 IMAGING — CR DG CHEST 1V
1 series · 1 of 1 positions shown · non-contrast
Comparison: Study obtained earlier in the day

CLINICAL DATA: Hypoxia

EXAM:
CHEST - 1 VIEW

[AP]
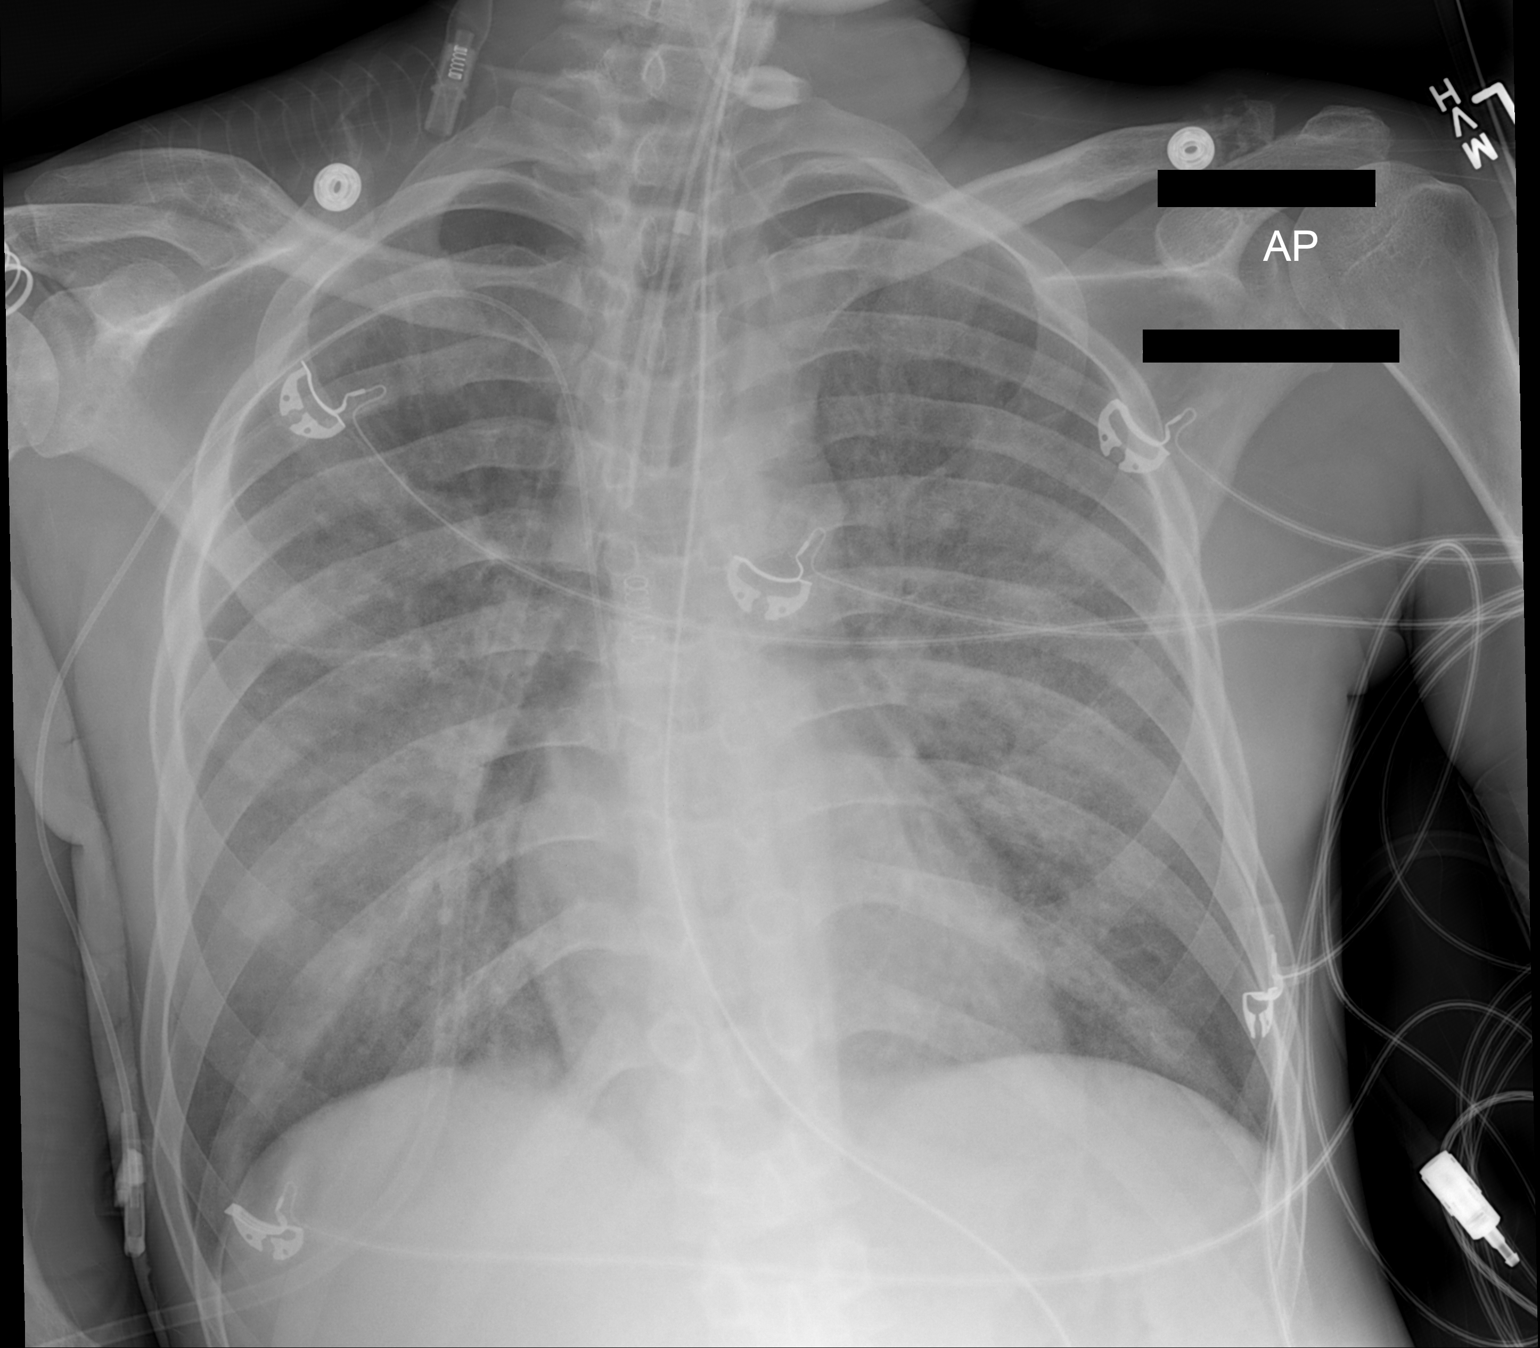

[1 of 1 positions shown; findings below may reference images not displayed]

FINDINGS: Endotracheal tube tip is 2.2 cm above the carina. Central catheter
tip is at the cavoatrial junction. Nasogastric tube tip and side
port are below the diaphragm. No pneumothorax.

There is diffuse interstitial edema, slightly less than on study
obtained earlier in the day. The known consolidation. No new
opacity.

Heart size and pulmonary vascularity are normal. No adenopathy.
There is evidence of old trauma involving the distal right clavicle.
There is a more recent fracture of the distal left clavicle.
IMPRESSION: Fairly diffuse interstitial edema remains, slightly less than
compared to earlier in the day. Suspect a degree of ARDS versus
noncardiogenic edema. No airspace consolidation. Tube and catheter
positions as described. No pneumothorax.

## 2015-12-07 IMAGING — CR DG CHEST 1V PORT
1 series · 1 of 1 positions shown · non-contrast
Comparison: 09/27/2013 .

CLINICAL DATA: Respiratory failure.

EXAM:
PORTABLE CHEST - 1 VIEW

[AP]
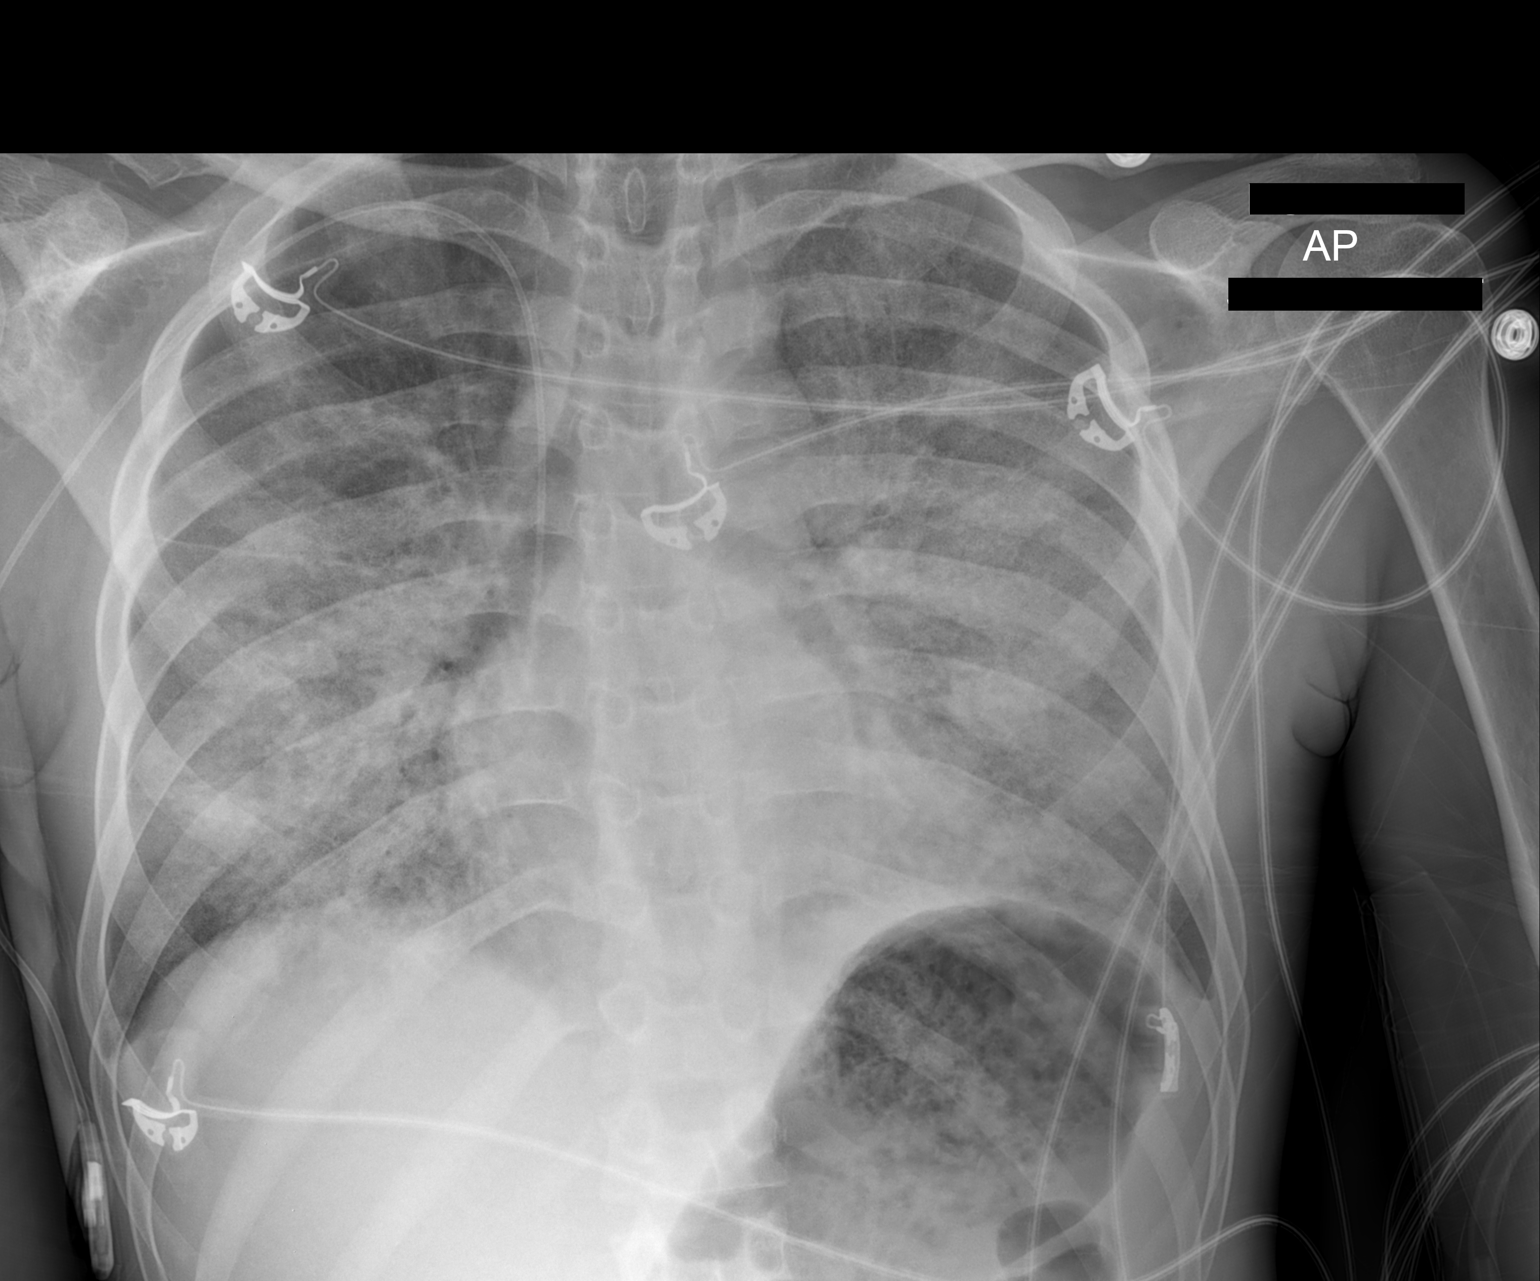

[1 of 1 positions shown; findings below may reference images not displayed]

FINDINGS: PICC line in stable position . Persistent dense bilateral pulmonary
alveolar infiltrates are noted. There is no cardiomegaly or evidence
of pulmonary venous congestion. This suggests the bilateral
pulmonary infiltrate secondary to pneumonia, hemorrhage, or
noncardiogenic pulmonary edema/ ARDS. No pneumothorax. Mild gastric
distention.
IMPRESSION: 1. Persistent dense bilateral pulmonary infiltrates. No cardiomegaly
or pulmonary venous congestion. Pulmonary infiltrates could be
secondary to bilateral dense pneumonia, hemorrhage, non cardiogenic
pulmonary edema/ARDS.
2. PICC line in stable position .
3. Mild gastric distention.

## 2015-12-07 MED ORDER — ALBUTEROL SULFATE (2.5 MG/3ML) 0.083% IN NEBU
2.5000 mg | INHALATION_SOLUTION | RESPIRATORY_TRACT | Status: DC | PRN
  Administered 2015-12-07 – 2015-12-09 (×5): 2.5 mg via RESPIRATORY_TRACT
  Filled 2015-12-07 (×5): qty 3

## 2015-12-07 MED ORDER — BACLOFEN 20 MG PO TABS
20.0000 mg | ORAL_TABLET | Freq: Four times a day (QID) | ORAL | Status: DC
  Filled 2015-12-07 (×3): qty 1

## 2015-12-07 MED ORDER — JEVITY 1.2 CAL PO LIQD: 1000.0000 mL | ORAL | Status: DC

## 2015-12-07 MED ORDER — ANTISEPTIC ORAL RINSE SOLUTION (CORINZ)
7.0000 mL | Freq: Four times a day (QID) | OROMUCOSAL | Status: DC
  Administered 2015-12-08 – 2016-01-05 (×100): 7 mL via OROMUCOSAL

## 2015-12-07 MED ORDER — BACLOFEN 1 MG/ML ORAL SUSPENSION
20.0000 mg | Freq: Four times a day (QID) | ORAL | Status: DC
  Administered 2015-12-07 – 2015-12-21 (×52): 20 mg via ORAL
  Filled 2015-12-07 (×59): qty 2

## 2015-12-07 MED ORDER — GLUCERNA 1.2 CAL PO LIQD
1000.0000 mL | ORAL | Status: DC
  Administered 2015-12-07: 1000 mL
  Filled 2015-12-07 (×2): qty 1000

## 2015-12-07 MED ORDER — LORAZEPAM 0.5 MG PO TABS
0.5000 mg | ORAL_TABLET | Freq: Three times a day (TID) | ORAL | Status: DC | PRN
  Administered 2015-12-09 – 2015-12-18 (×11): 0.5 mg via ORAL
  Filled 2015-12-07 (×13): qty 1

## 2015-12-07 MED ORDER — CHLORHEXIDINE GLUCONATE 0.12% ORAL RINSE (MEDLINE KIT)
15.0000 mL | Freq: Two times a day (BID) | OROMUCOSAL | Status: DC
  Administered 2015-12-07 – 2016-01-03 (×53): 15 mL via OROMUCOSAL

## 2015-12-07 MED ORDER — BUPROPION HCL ER (XL) 150 MG PO TB24
150.0000 mg | ORAL_TABLET | Freq: Every day | ORAL | Status: DC
  Administered 2015-12-08 – 2016-01-05 (×27): 150 mg via ORAL
  Filled 2015-12-07 (×58): qty 1

## 2015-12-07 MED ORDER — PREGABALIN 100 MG PO CAPS
100.0000 mg | ORAL_CAPSULE | Freq: Three times a day (TID) | ORAL | Status: DC
  Administered 2015-12-08 – 2015-12-18 (×32): 100 mg
  Filled 2015-12-07 (×35): qty 1

## 2015-12-07 NOTE — Consult Note (Addendum)
WOC wound consult note Reason for Consult: Consult requested for sacrum and bilat ischium wounds.  Pt is familiar to [facility] team from previous admissions.  He had ACell applied over the wounds to facilitiate closure in Nov by the plastics team, and is followed by the outpatient wound care center for assessment of wounds.  He has been using a negative pressure device at home which was removed prior to admission, it is a Medella, and mother states she is aware that it is [facility] policy to place our Vac machine on when a patient is admitted.  He is currently in the ER awaiting admission to the ICU stepdown unit, and it would be difficult to turn and apply the 3 Vac dressings which must be bridged together while on a narrow stretcher.  Pt and mother verbalize that they are satisfied with the plan of care to apply the dressings after he is admitted, even if the application does not occur until tomorrow related to a late transfer to the unit. Mother is very well-informed regarding topical treatment and states "you will be amazed at how the wounds are doing; they are very shallow and red." Pt was due for an appointment at the wound care center on Monday, but missed it due to resp distress.  He currently has a moist gauze dressing applied to all sites and mother is in agreement with this. An air mattress has been ordered for pressure reduction.    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

## 2015-12-07 NOTE — Consult Note (Signed)
[facility] for Infectious Disease    Date of Admission:  12/06/2015   Total days of antibiotics 2               Reason for Consult: Probable HCAP with acute on chronic respiratory failure    Referring Physician: Dr. Nadara Mode  Principal Problem:   HCAP (healthcare-associated pneumonia) Active Problems:   Acute on chronic respiratory failure ([place])   Recurrent UTI   Pressure ulcer   Neurogenic bladder   Respiratory failure, chronic ([place])   Paraplegia ([place])   . baclofen  20 mg Oral QID  . buPROPion  150 mg Oral Daily  . heparin  5,000 Units Subcutaneous 3 times per day  . insulin aspart  0-15 Units Subcutaneous 6 times per day  . levofloxacin (LEVAQUIN) IV  750 mg Intravenous Q24H  . midodrine  20 mg Per Tube TID WC  . pantoprazole (PROTONIX) IV  40 mg Intravenous QHS  . pregabalin  100 mg Per Tube TID  . vancomycin  500 mg Intravenous Q12H    Recommendations: 1. Continue current antibiotics pending further observation and final culture results   Assessment: His history and chest x-ray suggests that he probably has healthcare associated pneumonia again. Antibiotic selection is practically difficult in him because he has multiple antibiotic allergies and intolerances as well as a history of infection with multidrug resistant organisms. Fortunately he seems to be improving on his current regimen of vancomycin and levofloxacin. I will check cultures and follow-up tomorrow.   HPI: Jason [name] is a 30 y.o. male with paraplegia complicated by neurogenic bladder, recurrent UTIs, sacral pressure sores and chronic respiratory failure. Over the past 5-6 days he has been having increasing episodes of hypoxia. He has also had increase 6, creamy yellow secretions. He had to go back on the ventilator intermittently at home. He was getting increasingly anxious and distressed leading to readmission yesterday. Chest x-ray shows diffuse interstitial infiltrates  bilaterally. He has had low-grade fever which his mother states is chronic and normal for him. She states that his sacral wounds are actually looking remarkably better recently. He has had a much better appetite and has been gaining weight. He has a history of recurrent multidrug-resistant infections. Since his last hospitalization in November he has received a round of doxycycline for tracheitis and has been treated with fosfomycin and more recently ciprofloxacin for possible symptomatic urinary tract infections. He was started on empiric vancomycin and levofloxacin last night. He is feeling much better this evening.   Review of Systems: Review of Systems  Constitutional: Positive for fever. Negative for chills, weight loss, malaise/fatigue and diaphoresis.  HENT: Negative for sore throat.   Respiratory: Positive for cough, sputum production and shortness of breath. Negative for wheezing.   Cardiovascular: Negative for chest pain.  Gastrointestinal: Negative for nausea, vomiting and diarrhea.  Skin: Negative for rash.  Neurological: Negative for headaches.    Past Medical History  Diagnosis Date  . GERD (gastroesophageal reflux disease)   . Asthma   . Hx MRSA infection     on face  . Gastroparesis   . Diabetic neuropathy ([place])   . Seizures ([place])   . Family history of anesthesia complication     Pt mother can't have epidural procedures  . Dysrhythmia   . Pneumonia   . Arthritis   . Fibromyalgia   . 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  Type I diabetes mellitus ([place])     sees Dr. Loanne Drilling   . Syncope 02/16/2015  . Multiple allergies 11/02/2015  . Recurrent UTI 11/02/2015    Social History  Substance Use Topics  . Smoking status: Former Smoker -- 1.00 packs/day for 12 years    Types: Cigars, Cigarettes    Quit date: 09/07/2014  . Smokeless tobacco: Never Used  . Alcohol Use: No    Family History  Problem Relation Age of Onset  . Diabetes Father     . Hypertension Father   . Asthma      fhx  . Hypertension      fhx  . Stroke      fhx  . Heart disease Mother    Allergies  Allergen Reactions  . Cefuroxime Axetil Anaphylaxis  . Ertapenem Other (See Comments)    Rash and confusion-->tolerated Imipenem   . Morphine And Related Other (See Comments)    Changed mental status, confusion, headache, visual hallucination  . Penicillins Anaphylaxis and Other (See Comments)    Tolerated Imipenem; no reaction to 7 day course of amoxicillin in 2015 Has patient had a PCN reaction causing immediate rash, facial/tongue/throat swelling, SOB or lightheadedness with hypotension: Yes Has patient had a PCN reaction causing severe rash involving mucus membranes or skin necrosis: No Has patient had a PCN reaction that required hospitalization Yes Has patient had a PCN reaction occurring within the last 10 years: No If all of the above answers are "NO", then may proceed with Cephalo  . Sulfa Antibiotics Anaphylaxis, Shortness Of Breath and Other (See Comments)  . Tessalon [Benzonatate] Anaphylaxis  . Shellfish Allergy Itching and Other (See Comments)    Took benadryl to alleviate reaction  . Nsaids Other (See Comments)    Risk of bleeding  . Miripirium Rash and Other (See Comments)    Change in mental status    OBJECTIVE: Blood pressure 146/82, pulse 88, temperature 99.3 F (37.4 C), temperature source Oral, resp. rate 20, height 5\' 8"  (1.727 m), SpO2 100 %.  Physical Exam  Constitutional: No distress.  He is alert and in good spirits. He is on the ventilator via his tracheostomy tube.  HENT:  Mouth/Throat: No oropharyngeal exudate.  Eyes: Conjunctivae are normal.  Cardiovascular: Normal rate and regular rhythm.   No murmur heard. Pulmonary/Chest: Breath sounds normal. He has no wheezes. He has no rales.  Abdominal: Soft.  PEG tube in place.    Lab Results Lab Results  Component Value Date   WBC 23.6* 12/07/2015   HGB 7.4*  12/07/2015   HCT 24.6* 12/07/2015   MCV 77.8* 12/07/2015   PLT 717* 12/07/2015    Lab Results  Component Value Date   CREATININE 1.14 12/07/2015   BUN 29* 12/07/2015   NA 132* 12/07/2015   K 5.0 12/07/2015   CL 101 12/07/2015   CO2 19* 12/07/2015    Lab Results  Component Value Date   ALT 10* 12/06/2015   AST 15 12/06/2015   ALKPHOS 102 12/06/2015   BILITOT 0.4 12/06/2015     Microbiology: Recent Results (from the past 240 hour(s))  Culture, blood (routine x 2)     Status: None (Preliminary result)   Collection Time: 12/06/15  8:07 PM  Result Value Ref Range Status   Specimen Description BLOOD RIGHT ANTECUBITAL  Final   Special Requests BOTTLES DRAWN AEROBIC AND ANAEROBIC 5CC  Final   Culture NO GROWTH < 24 HOURS  Final   Report Status PENDING  Incomplete  Culture, blood (routine x 2)     Status: None (Preliminary result)   Collection Time: 12/06/15  8:16 PM  Result Value Ref Range Status   Specimen Description BLOOD LEFT HAND  Final   Special Requests IN PEDIATRIC BOTTLE 4CC  Final   Culture NO GROWTH < 24 HOURS  Final   Report Status PENDING  Incomplete    Michel Bickers, MD [facility] [phone] pager   [phone] cell 12/07/2015, 5:17 PM

## 2015-12-07 NOTE — ED Notes (Signed)
CBG 177. 

## 2015-12-07 NOTE — ED Notes (Signed)
Attempted to call report. Floor RN unable to accept report.  

## 2015-12-07 NOTE — ED Notes (Signed)
Small amt of thick, white, tan secretions suctioned from pts lungs.  CPT held at this time.

## 2015-12-07 NOTE — Progress Notes (Signed)
PULMONARY / CRITICAL CARE MEDICINE   Name: Jason [name] MRN: [id number] DOB: 08-08-1986    ADMISSION DATE:  12/06/2015 CONSULTATION DATE:  12/06/15  REFERRING MD:  EDP  CHIEF COMPLAINT:  Decrease in O2 sats, fever  SUBJECTIVE: Mother at bedside, reports improved since on vent.  Patient feels comfortable on vent, denies distress.    VITAL SIGNS: BP 122/82 mmHg  Pulse 92  Temp(Src) 100 F (37.8 C) (Oral)  Resp 20  Ht 5\' 8"  (1.727 m)  SpO2 100%  HEMODYNAMICS:    VENTILATOR SETTINGS: Vent Mode:  [-] PRVC FiO2 (%):  [30 %] 30 % Set Rate:  [12 bmp] 12 bmp Vt Set:  [600 mL] 600 mL PEEP:  [5 cmH20] 5 cmH20 Plateau Pressure:  [21 cmH20-22 cmH20] 21 cmH20  INTAKE / OUTPUT:     PHYSICAL EXAMINATION: General: Chronically ill appearing young male on vent, in NAD. Neuro: Awake, nods head appropriately in response to questions. HEENT: [place]/AT. PERRL, sclerae anicteric. Cardiovascular: RRR, no M/R/G.  Lungs: Respirations even and unlabored.  Coarse bilaterally L > R, soft wheeze on L lateral  Abdomen: PEG in place, BS x 4, soft, NT/ND.  Musculoskeletal: Muscle wasting bilaterally.  Bilateral ankles in boots. No peripheral edema.  Skin: Intact, warm, no rashes.  LABS:  BMET  Recent Labs Lab 12/06/15 2008 12/07/15 0319  NA 130* 132*  K 4.8 5.0  CL 103 101  CO2 18* 19*  BUN 27* 29*  CREATININE 0.94 1.14  GLUCOSE 217* 254*    Electrolytes  Recent Labs Lab 12/06/15 2008 12/07/15 0319  CALCIUM 12.7* 12.8*  MG  --  1.7  PHOS  --  3.5    CBC  Recent Labs Lab 12/06/15 2236 12/07/15 0319  WBC 22.2* 23.6*  HGB 7.5* 7.4*  HCT 25.4* 24.6*  PLT 752* 717*    Coag's No results for input(s): APTT, INR in the last 168 hours.  Sepsis Markers  Recent Labs Lab 12/06/15 2027 12/06/15 2254 12/07/15 0318 12/07/15 0319  LATICACIDVEN 1.42 0.68  --  1.0  PROCALCITON  --   --  6.53  --     ABG  Recent Labs Lab 12/07/15 0545  PHART 7.369  PCO2ART  30.5*  PO2ART 61.0*    Liver Enzymes  Recent Labs Lab 12/06/15 2008  AST 15  ALT 10*  ALKPHOS 102  BILITOT 0.4  ALBUMIN 2.6*    Cardiac Enzymes No results for input(s): TROPONINI, PROBNP in the last 168 hours.  Glucose  Recent Labs Lab 12/07/15 0749 12/07/15 1225  GLUCAP 274* 177*    Imaging Dg Chest Port 1 View  12/07/2015  CLINICAL DATA:  Acute onset of respiratory failure. Initial encounter. EXAM: PORTABLE CHEST 1 VIEW COMPARISON:  Chest radiograph performed 12/06/2015 FINDINGS: The patient's tracheostomy tube is seen ending 5 cm above the carina. Bibasilar airspace opacities may reflect pneumonia or pulmonary edema. Small bilateral pleural effusions are noted. No pneumothorax is seen. The cardiomediastinal silhouette remains normal in size. No acute osseous abnormalities are identified. IMPRESSION: Bibasilar airspace opacities may reflect pneumonia or pulmonary edema. Small bilateral pleural effusions noted. Electronically Signed   By: Garald Balding M.D.   On: 12/07/2015 06:43   Dg Chest Portable 1 View  12/06/2015  CLINICAL DATA:  30 year old male with sepsis and decreased O2 sats. 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 There are bibasilar opacities with silhouetting of the diaphragm, concerning for pneumonia.  There has been interval increase in the left lung base density compared to the prior study. Small bilateral pleural effusions may be present. The cardiac silhouette is within normal limits. No acute osseous pathology. IMPRESSION: Bibasilar airspace densities concerning for pneumonia. Clinical correlation and follow-up recommended. Electronically Signed   By: Anner Crete M.D.   On: 12/06/2015 19:59     STUDIES:  CXR 02/28 > bibasilar airspace densities concerning for PNA.  CULTURES: Blood 02/28 >> Urine 02/28 >> Sputum 02/28 >>   ANTIBIOTICS: Vanc 02/28 >>   Levaquin 02/28 >>  SIGNIFICANT EVENTS: 2/28  Admitted with probable HCAP  LINES/TUBES: Chronic trach / PEG >>   ASSESSMENT / PLAN:  PULMONARY A: 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 Probable HCAP vs tracheitis. ? Component of Pulmonary Edema with small bilateral effusions P:   PRVC, would keep on vent tonight then attempt ATC and possibly PMV in AM 3/2 (per mom, pt has been on PMV 24/7 for quite some time prior to this) VAP prevention measures Abx / cultures per ID section Albuterol PRN Chest PT CXR in AM See ID   CARDIOVASCULAR A:  Chronic hypotension - on midodrine. P:  Continue outpatient midodrine Assess lactate  RENAL A:   Hyponatremia - chronic P:   Reduce NS to 50 ml/hr BMP in AM  GASTROINTESTINAL A:   GERD, gastroparesis Nutrition P:   Pantoprazole NPO Nutrition consult for TF's  HEMATOLOGIC A:   Anemia - chronic Thrombocytosis - chronic VTE Prophylaxis P:  Transfuse for Hgb < 7 SCD's / heparin CBC in AM  INFECTIOUS A:   UTI - hx pseudomonal UTI in past with resistance to multiple abx Probable HCAP vs tracheitis Hx sacral wounds P:   Abx as above (vanc / levaquin). Follow cultures as above PCT algorithm to limit abx exposure Wound care consult Consult ID given hx of resistant pseudomonas UTI, HCAP, +/- tracheitis  ENDOCRINE A:   DM. P:   SSI  NEUROLOGIC A:   Hx seizures (not on meds), paraplegia, fibromyalgia, diabetic neuropathy P:   Fentanyl PRN pain Hold outpatient fentanyl patch  Resume bupropion, lorazepam, & lyrica   Family updated: Mother at bedside  Interdisciplinary Family Meeting v Palliative Care Meeting:  Due by: 12/12/15    Noe Gens, NP-C [facility] Pgr: [phone] or if no answer [phone] 12/07/2015, 2:57 PM    STAFF NOTE: I, Merrie Roof, MD FACP have personally reviewed patient's available data, including medical history, events of  note, physical examination and test results as part of my evaluation. I have discussed with resident/NP and other care providers such as pharmacist, RN and RRT. In addition, I personally evaluated patient and elicited key findings of: bibasilar ronchi, pcxr c/w PNA, on vent, normally on TC with rare int vent needs, keep current ABX regimen, high risk for MDR organism, pcxr needed repeat in am, for high risk collapse, would slowly return to TC in am for a short period fo time given he owuld not resolve this well off pos pressure, need him to mobilize secretions, mom and pt updated in room by me  Lavon Paganini. Titus Mould, MD, [place] Pgr: [facility] 12/07/2015 5:12 PM

## 2015-12-07 NOTE — Procedures (Signed)
Against RT wishes, pt does not want to be suction or have CPT done at this time. Pt has agreed to let RT perform CPT with possible suction with next vent check.

## 2015-12-07 NOTE — ED Notes (Signed)
CPT held in ED at this time.

## 2015-12-07 NOTE — Progress Notes (Signed)
Brief Nutrition Note:  RN from ED called and spoke to this RD. Consult received for enteral/tube feeding initiation and management.  Mom concerned that pt has not received his TF since being in the ED. Pt remains in the ED and RN unsure when pt will be transferred to a patient room. Per mom pt receives Glucerna 1.0 over 12 hours from 12 pm to 12 am. Reviewed prior RD assessments pt previously on Glucerna 1.2 @ 70 ml/hr over 12 hours.   Adult Enteral Nutrition Protocol initiated. Full assessment to follow.   Recent Labs Lab 12/06/15 2008 12/07/15 0319  NA 130* 132*  K 4.8 5.0  CL 103 101  CO2 18* 19*  BUN 27* 29*  CREATININE 0.94 1.14  CALCIUM 12.7* 12.8*  MG  --  1.7  PHOS  --  3.5  GLUCOSE 217* [address], LDN, CNSC [phone] Pager [phone] After Hours Pager

## 2015-12-08 ENCOUNTER — Inpatient Hospital Stay (HOSPITAL_COMMUNITY): Payer: Medicaid Other

## 2015-12-08 DIAGNOSIS — Z9911 Dependence on respirator [ventilator] status: Secondary | ICD-10-CM

## 2015-12-08 DIAGNOSIS — J189 Pneumonia, unspecified organism: Secondary | ICD-10-CM | POA: Diagnosis present

## 2015-12-08 LAB — BASIC METABOLIC PANEL
ANION GAP: 8 (ref 5–15)
BUN: 36 mg/dL — ABNORMAL HIGH (ref 6–20)
CO2: 15 mmol/L — AB (ref 22–32)
Calcium: 12 mg/dL — ABNORMAL HIGH (ref 8.9–10.3)
Chloride: 109 mmol/L (ref 101–111)
Creatinine, Ser: 1.17 mg/dL (ref 0.61–1.24)
GFR calc Af Amer: >60 mL/min (ref >60)
GFR calc non Af Amer: >60 mL/min (ref >60)
GLUCOSE: 233 mg/dL — AB (ref 65–99)
POTASSIUM: 4.5 mmol/L (ref 3.5–5.1)
Sodium: 132 mmol/L — ABNORMAL LOW (ref 135–145)

## 2015-12-08 LAB — CBC
HEMATOCRIT: 21.8 % — AB (ref 39.0–52.0)
HEMOGLOBIN: 6.8 g/dL — AB (ref 13.0–17.0)
MCH: 24.4 pg — ABNORMAL LOW (ref 26.0–34.0)
MCHC: 31.2 g/dL (ref 30.0–36.0)
MCV: 78.1 fL (ref 78.0–100.0)
PLATELETS: 666 10*3/uL — AB (ref 150–400)
RBC: 2.79 MIL/uL — ABNORMAL LOW (ref 4.22–5.81)
RDW: 14.5 % (ref 11.5–15.5)
WBC: 24.7 10*3/uL — ABNORMAL HIGH (ref 4.0–10.5)

## 2015-12-08 LAB — URINE CULTURE

## 2015-12-08 LAB — PREPARE RBC (CROSSMATCH)

## 2015-12-08 LAB — GLUCOSE, CAPILLARY
GLUCOSE-CAPILLARY: 220 mg/dL — AB (ref 65–99)
GLUCOSE-CAPILLARY: 150 mg/dL — AB (ref 65–99)
GLUCOSE-CAPILLARY: 221 mg/dL — AB (ref 65–99)
Glucose-Capillary: 142 mg/dL — ABNORMAL HIGH (ref 65–99)
Glucose-Capillary: 322 mg/dL — ABNORMAL HIGH (ref 65–99)
Glucose-Capillary: 404 mg/dL — ABNORMAL HIGH (ref 65–99)

## 2015-12-08 LAB — VANCOMYCIN, TROUGH: VANCOMYCIN TR: 19 ug/mL (ref 10.0–20.0)

## 2015-12-08 LAB — PROCALCITONIN: Procalcitonin: 7.36 ng/mL

## 2015-12-08 IMAGING — CR DG CHEST 1V PORT
1 series · 1 of 1 positions shown · non-contrast
Comparison: September 28, 2013

CLINICAL DATA: Hypoxia

EXAM:
PORTABLE CHEST - 1 VIEW

[AP]
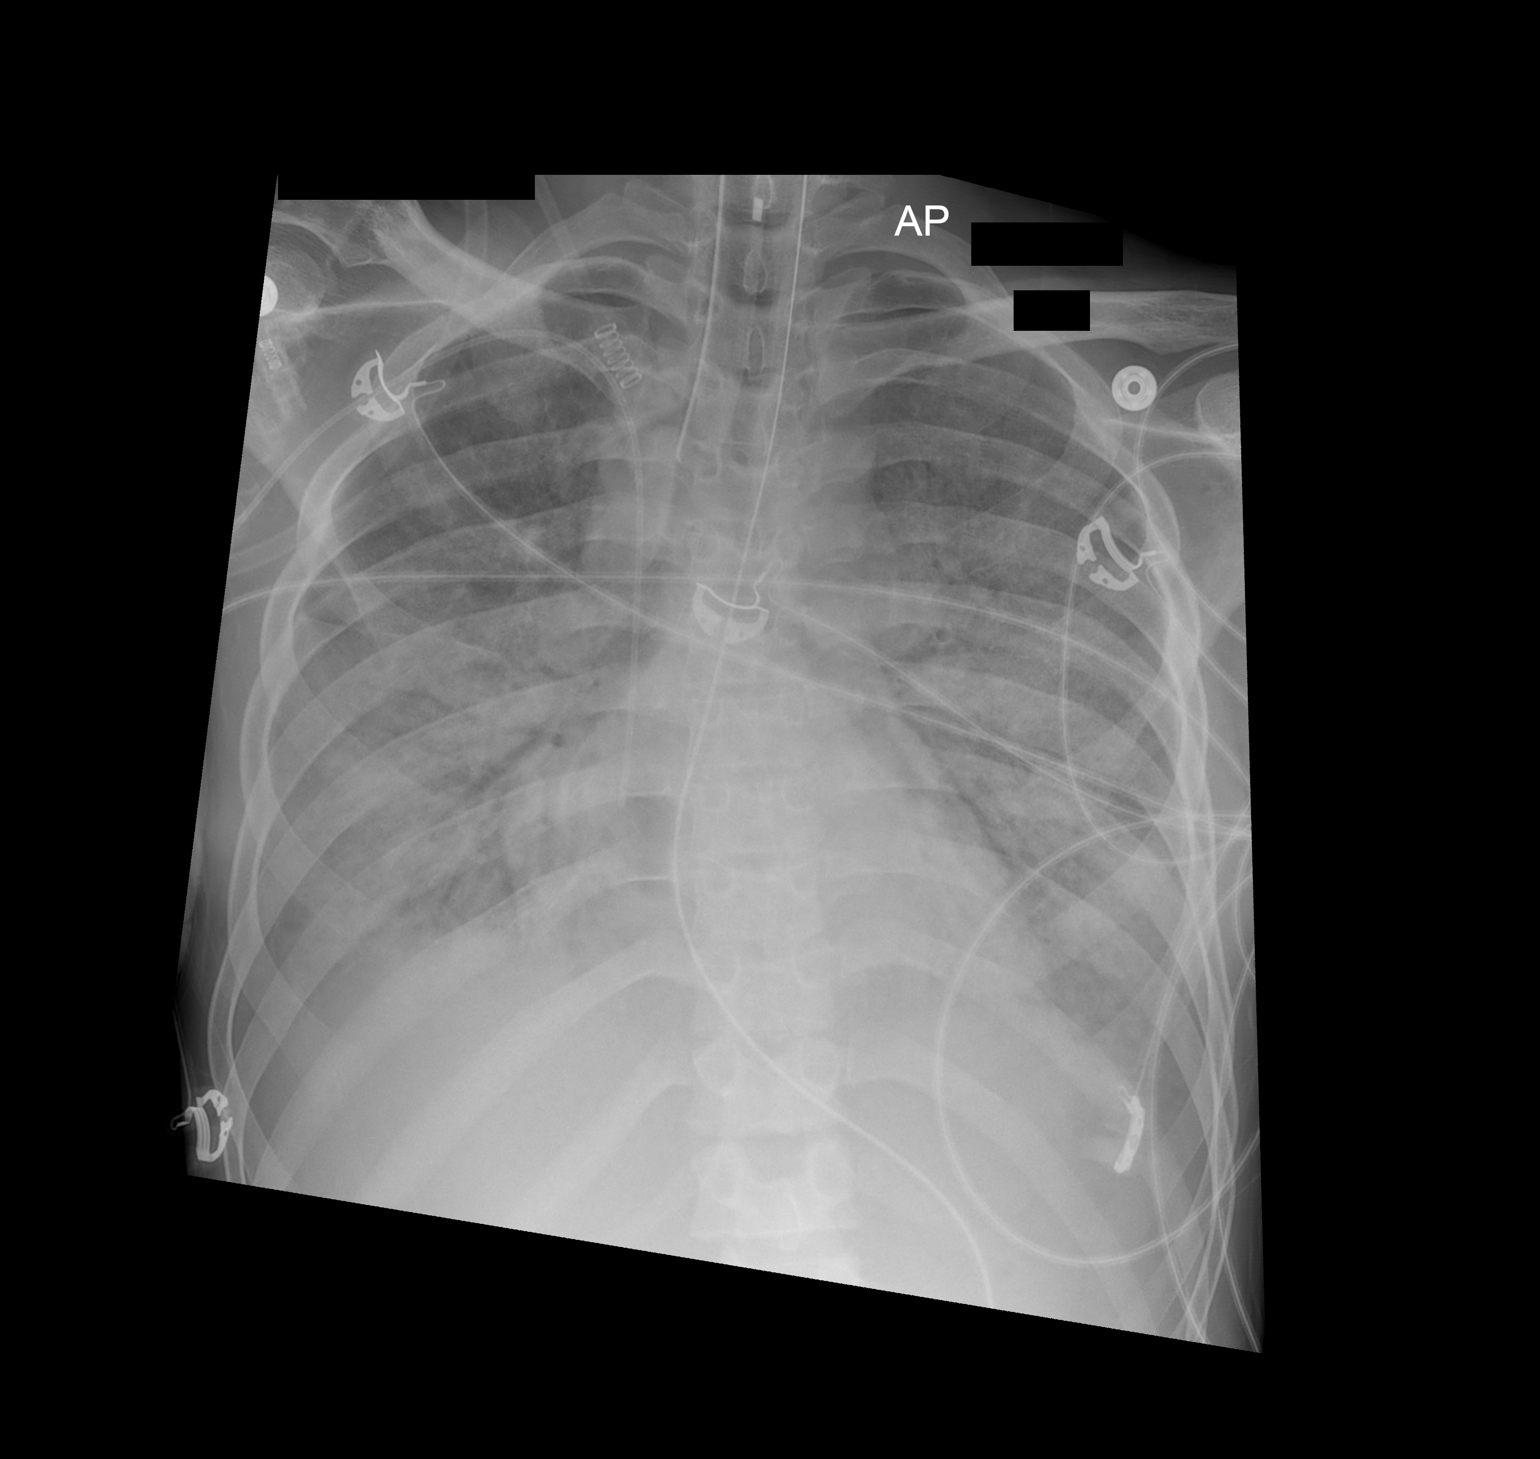

[1 of 1 positions shown; findings below may reference images not displayed]

FINDINGS: Endotracheal tube tip is 3.5 cm above the carina. Central catheter
tip is in the right atrium. Nasogastric tube and side-port extend
below the diaphragm. There is no appreciable pneumothorax.

There is increase in airspace consolidation throughout the mid and
lower lung zones. There is underlying interstitial edema. Heart size
is normal. Pulmonary vascularity is normal.
IMPRESSION: Increase in and airspace consolidation throughout the mid and lower
lung zones. Question progression of ARDS versus superimposed
pneumonia. Both entities may exist concurrently.

Tube and catheter positions are as described. No apparent
pneumothorax.

## 2015-12-08 MED ORDER — FENTANYL 25 MCG/HR TD PT72
50.0000 ug | MEDICATED_PATCH | TRANSDERMAL | Status: DC
  Administered 2015-12-09 – 2016-01-04 (×10): 50 ug via TRANSDERMAL
  Filled 2015-12-08 (×11): qty 2

## 2015-12-08 MED ORDER — FENTANYL CITRATE (PF) 100 MCG/2ML IJ SOLN
25.0000 ug | INTRAMUSCULAR | Status: DC | PRN
  Administered 2015-12-09 (×3): 50 ug via INTRAVENOUS
  Administered 2015-12-09: 100 ug via INTRAVENOUS
  Administered 2015-12-09: 50 ug via INTRAVENOUS
  Administered 2015-12-09: 100 ug via INTRAVENOUS
  Administered 2015-12-09 – 2015-12-10 (×2): 50 ug via INTRAVENOUS
  Administered 2015-12-10 (×2): 100 ug via INTRAVENOUS
  Administered 2015-12-10 – 2015-12-11 (×7): 50 ug via INTRAVENOUS
  Administered 2015-12-11 – 2015-12-12 (×4): 100 ug via INTRAVENOUS
  Administered 2015-12-12 (×3): 50 ug via INTRAVENOUS
  Administered 2015-12-13 (×2): 100 ug via INTRAVENOUS
  Administered 2015-12-13: 50 ug via INTRAVENOUS
  Administered 2015-12-14 (×3): 100 ug via INTRAVENOUS
  Administered 2015-12-14 (×2): 50 ug via INTRAVENOUS
  Administered 2015-12-15 (×4): 100 ug via INTRAVENOUS
  Filled 2015-12-08 (×40): qty 2

## 2015-12-08 MED ORDER — INSULIN ASPART 100 UNIT/ML ~~LOC~~ SOLN
0.0000 [IU] | SUBCUTANEOUS | Status: DC
  Administered 2015-12-08: 15 [IU] via SUBCUTANEOUS
  Administered 2015-12-08: 20 [IU] via SUBCUTANEOUS
  Administered 2015-12-09: 11 [IU] via SUBCUTANEOUS
  Administered 2015-12-09: 15 [IU] via SUBCUTANEOUS
  Administered 2015-12-09: 11 [IU] via SUBCUTANEOUS
  Administered 2015-12-09: 4 [IU] via SUBCUTANEOUS
  Administered 2015-12-09: 15 [IU] via SUBCUTANEOUS
  Administered 2015-12-10: 7 [IU] via SUBCUTANEOUS
  Administered 2015-12-10: 11 [IU] via SUBCUTANEOUS
  Administered 2015-12-10 – 2015-12-11 (×2): 4 [IU] via SUBCUTANEOUS
  Administered 2015-12-11: 7 [IU] via SUBCUTANEOUS
  Administered 2015-12-11: 11 [IU] via SUBCUTANEOUS
  Administered 2015-12-11 (×3): 4 [IU] via SUBCUTANEOUS
  Administered 2015-12-12: 11 [IU] via SUBCUTANEOUS
  Administered 2015-12-12: 20 [IU] via SUBCUTANEOUS
  Administered 2015-12-12: 15 [IU] via SUBCUTANEOUS

## 2015-12-08 MED ORDER — GLUCERNA 1.2 CAL PO LIQD
1000.0000 mL | ORAL | Status: DC
  Administered 2015-12-09: 1000 mL
  Filled 2015-12-08 (×6): qty 1000

## 2015-12-08 MED ORDER — INSULIN GLARGINE 100 UNIT/ML ~~LOC~~ SOLN
15.0000 [IU] | Freq: Every day | SUBCUTANEOUS | Status: DC
  Administered 2015-12-08 – 2015-12-09 (×2): 15 [IU] via SUBCUTANEOUS
  Filled 2015-12-08 (×3): qty 0.15

## 2015-12-08 MED ORDER — SODIUM BICARBONATE 8.4 % IV SOLN
INTRAVENOUS | Status: DC
  Administered 2015-12-08 – 2015-12-09 (×2): via INTRAVENOUS
  Filled 2015-12-08 (×3): qty 150

## 2015-12-08 MED ORDER — SODIUM CHLORIDE 0.9 % IV SOLN
Freq: Once | INTRAVENOUS | Status: AC
  Administered 2015-12-08: 07:00:00 via INTRAVENOUS

## 2015-12-08 MED ORDER — ACETYLCYSTEINE 20 % IN SOLN
4.0000 mL | Freq: Two times a day (BID) | RESPIRATORY_TRACT | Status: AC
Start: 2015-12-08 — End: 2015-12-09
  Administered 2015-12-08 – 2015-12-09 (×4): 4 mL via RESPIRATORY_TRACT
  Filled 2015-12-08 (×4): qty 4

## 2015-12-08 NOTE — Progress Notes (Signed)
[facility] Progress Note Patient Name: Jason [name] DOB: Feb 16, 1986 MRN: [id number]   Date of Service  12/08/2015  HPI/Events of Note  Called for CBG 404   eICU Interventions  - cover with SSI per current orders - change SSi to resistant scale - restart home lantus 15u qpm     Intervention Category Intermediate Interventions: Hyperglycemia - evaluation and treatment  George Alcantar S. 12/08/2015, 4:59 PM

## 2015-12-08 NOTE — Consult Note (Addendum)
WOC wound follow up: Refer to previous [facility] notes from yesterday.  Consult performed today for Vac application. 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 Pressure Ulcer POA: Yes x 4 Measurement: L foot/dorsal: full thickness .4cm x 0.4cm x 0.1cm, red and moist, scant amt blood tinged drainage, no odor L outer foot: full thickness .5cm x .5cm x 0.1cm, red and moist, scant amt blood tinged drainage, no odor Left heel: Stage 2 pressure injury; .5X.5X.1cm, 100% red and moist, scant amt blood tinged drainage, no odor Left ischium: Stage 4 pressure injury 7cm x 8cm x 2cm with 2cm undermining from 4-12 o'clock beefy red with small amt pink drainage, no odor, bone palpable Right ischium: Stage 4 pressure injury: 6cm x 4cm x 2cm with 2 cm tunneling at 12:00 o'clock, beefy red with small amt pink drainage, no odor, bone palpable  Sacrum: Stage 4 pressure injury 10cm x 8cm x 2.0cm, 90% red, 10% yellow interspersed throughout, small amt pink drainage, no odor, bone palpable  Midback: Pink dry scar tissue from previous wound which has healed.  Dressing procedure/placement/frequency: Pt is on an air mattress to decrease pressure, and is wearing bilat Prevalon boots.  Foam dressings to left foot and heel wounds to promote healing.  Ostomy pouch intact with good seal, mod amt brown semiformed stool.  Pouching supplies left at bedside for staff nurse use.   Applied Vac dressing to left and right iscium and sacrum wounds; one piece black foam to each wound, bridged together, then bridged to hip to reduce pressure from the track pad.  Pt tolerated without pain.  Attached Y connector to one machine with cont suction on at 128mm.  Mother states she is very familiar with Vac application and changing process and will help the bedside nurse perform the procedure on Sat when the next dressing change is due. He can resume use of his own negative pressure device after discharge. This  consult took one hour to perform. Please re-consult if further assistance is needed.  Thank-you,  Julien Girt MSN, [place], [place], [place], [place]

## 2015-12-08 NOTE — Clinical Documentation Improvement (Signed)
Hospitalist  Would you please clarify if sepsis has been ruled in or out.  Mentioned as an admission diagnosis and not listed since then   Sepsis ruled in  Sepsis ruled out  Other  Clinically Undetermined  Supporting Information: On admission BP was 134/82, Pulse was 102, Temp was 100, Resp was 13.  WBC - 23.6, lactic acid - 1.42   Please exercise your independent, professional judgment when responding. A specific answer is not anticipated or expected.   Thank You,  [place] [phone]

## 2015-12-08 NOTE — Progress Notes (Addendum)
Initial Nutrition Assessment  DOCUMENTATION CODES:   Not applicable  INTERVENTION:  -Maintain glucerna 1.2 @ 24mL/hr increase by 10 every 4 hours to goal rate of 121mL/hr over 12 hours. -TF Regimen provides 1600 calories, 80g protein, 1073cc free H2O   NUTRITION DIAGNOSIS:   Inadequate oral intake related to inability to eat as evidenced by NPO status.  GOAL:   Patient will meet greater than or equal to 90% of their needs  MONITOR:   Vent status, Labs, TF tolerance, I & O's, Diet advancement  REASON FOR ASSESSMENT:   Consult Enteral/tube feeding initiation and management  ASSESSMENT:   Jason [name] is a 30 y.o. 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 He is followed in trach clinic and he has been doing well and has not required any ventilator support for quite some time, last time roughly November 2016. He had been able to use PMV 24 hours a day without problem.   Patient is currently intubated on ventilator support MV: 6.7 L/min Temp (24hrs), Avg:98.9 F (37.2 C), Min:97.7 F (36.5 C), Max:102.3 F (39.1 C)  Propofol: None  Spoke with pt's mom at bedside. She reports pt has been eating well at home, often she felt, eating too much. There was no weight on file for pt, Mom's most recent weight was 162.5#. Utilized this for energy calculations. Endorses weight gain, denies chewing/swallowing problems. Pt is a Juvenile Diabetic, could not find record of this in chart.  He was on Glucerna 1.2 from ED, will continue with higher rate + Pro-Stat to help meet protein needs and facilitate wound healing.  -Turns out pt is allergic to Pro-Stat, will continue with solely glucerna.  Labs: CBGs 150-220, BUN 36 Medications: Fentanyl PRN  Diet Order:  Diet NPO time specified  Skin:   Stg IV to sacrum & ischial tuberosity, Stg III to heal.  Last BM:  3/1  Height:   Ht Readings from  Last 1 Encounters:  12/07/15 5\' 8"  (1.727 m)    Weight:   Wt Readings from Last 1 Encounters:  09/29/15 155 lb (70.308 kg)    Ideal Body Weight:  70 kg  BMI:  There is no weight on file to calculate BMI.  Estimated Nutritional Needs:   Kcal:  1740 calories  Protein:  90-110 grams  Fluid:  >/= 1.7L  EDUCATION NEEDS:   No education needs identified at this time  Satira Anis. Valton Schwartz, MS, RD LDN After Hours/Weekend Pager [phone]

## 2015-12-08 NOTE — Progress Notes (Signed)
Patient ID: Jason [name], male   DOB: 30-May-1986, 30 y.o.   MRN: [id number]         [facility] for Infectious Disease    Date of Admission:  12/06/2015   Total days of antibiotics 3         Principal Problem:   HCAP (healthcare-associated pneumonia) Active Problems:   Acute on chronic respiratory failure ([name])   Recurrent UTI   Pressure ulcer   Neurogenic bladder   Respiratory failure, chronic ([place])   Paraplegia ([name])   Acute respiratory failure ([place])   . acetylcysteine  4 mL Nebulization BID  . antiseptic oral rinse  7 mL Mouth Rinse QID  . baclofen  20 mg Oral Q6H  . buPROPion  150 mg Oral Daily  . chlorhexidine gluconate  15 mL Mouth Rinse BID  . heparin  5,000 Units Subcutaneous 3 times per day  . insulin aspart  0-15 Units Subcutaneous 6 times per day  . levofloxacin (LEVAQUIN) IV  750 mg Intravenous Q24H  . midodrine  20 mg Per Tube TID WC  . pantoprazole (PROTONIX) IV  40 mg Intravenous QHS  . pregabalin  100 mg Per Tube TID  . vancomycin  500 mg Intravenous Q12H    Review of Systems: Review of Systems  Unable to perform ROS: intubated    Past Medical History  Diagnosis Date  . GERD (gastroesophageal reflux disease)   . Asthma   . Hx MRSA infection     on face  . Gastroparesis   . Diabetic neuropathy ([place])   . Seizures ([place])   . Family history of anesthesia complication     Pt mother can't have epidural procedures  . Dysrhythmia   . Pneumonia   . Arthritis   . Fibromyalgia   . 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 Type I diabetes mellitus ([place])     sees Dr. Loanne Drilling   . Syncope 02/16/2015  . Multiple allergies 11/02/2015  . Recurrent UTI 11/02/2015    Social History  Substance Use Topics  . Smoking status: Former Smoker -- 1.00 packs/day for 12 years    Types: Cigars, Cigarettes    Quit date: 09/07/2014  . Smokeless tobacco: Never Used  . Alcohol Use: No    Family History  Problem Relation Age of Onset    . Diabetes Father   . Hypertension Father   . Asthma      fhx  . Hypertension      fhx  . Stroke      fhx  . Heart disease Mother    Allergies  Allergen Reactions  . Cefuroxime Axetil Anaphylaxis  . Ertapenem Other (See Comments)    Rash and confusion-->tolerated Imipenem   . Morphine And Related Other (See Comments)    Changed mental status, confusion, headache, visual hallucination  . Penicillins Anaphylaxis and Other (See Comments)    Tolerated Imipenem; no reaction to 7 day course of amoxicillin in 2015 Has patient had a PCN reaction causing immediate rash, facial/tongue/throat swelling, SOB or lightheadedness with hypotension: Yes Has patient had a PCN reaction causing severe rash involving mucus membranes or skin necrosis: No Has patient had a PCN reaction that required hospitalization Yes Has patient had a PCN reaction occurring within the last 10 years: No If all of the above answers are "NO", then may proceed with Cephalo  . Sulfa Antibiotics Anaphylaxis, Shortness Of Breath and Other (See Comments)  . Tessalon [Benzonatate]  Anaphylaxis  . Shellfish Allergy Itching and Other (See Comments)    Took benadryl to alleviate reaction  . Nsaids Other (See Comments)    Risk of bleeding  . Miripirium Rash and Other (See Comments)    Change in mental status    OBJECTIVE: Filed Vitals:   12/08/15 0745 12/08/15 0753 12/08/15 0800 12/08/15 0851  BP: 107/74 107/74 104/75   Pulse: 89 88 88   Temp:  98.1 F (36.7 C) 97.9 F (36.6 C) 97.7 F (36.5 C)  TempSrc:  Oral Oral Axillary  Resp: 12 12 12    Height:      SpO2: 100% 100% 100%    There is no weight on file to calculate BMI.  Physical Exam  Constitutional:  He is sound asleep. He remains on the ventilator. His mother is at the bedside.  Cardiovascular: Normal rate and regular rhythm.   No murmur heard. Pulmonary/Chest: He has no wheezes.  Coarse rhonchi, right greater than left.  Abdominal: Soft.  He has been  having intermittent bowel movements per rectum despite having a colostomy. His mother states that there has not been any problem with sacral wound contamination.  Musculoskeletal:  Gauze dressings on left foot are clean and dry.  Skin: No rash noted.    Lab Results Lab Results  Component Value Date   WBC 24.7* 12/08/2015   HGB 6.8* 12/08/2015   HCT 21.8* 12/08/2015   MCV 78.1 12/08/2015   PLT 666* 12/08/2015    Lab Results  Component Value Date   CREATININE 1.17 12/08/2015   BUN 36* 12/08/2015   NA 132* 12/08/2015   K 4.5 12/08/2015   CL 109 12/08/2015   CO2 15* 12/08/2015    Lab Results  Component Value Date   ALT 10* 12/06/2015   AST 15 12/06/2015   ALKPHOS 102 12/06/2015   BILITOT 0.4 12/06/2015     Microbiology: Recent Results (from the past 240 hour(s))  Culture, blood (routine x 2)     Status: None (Preliminary result)   Collection Time: 12/06/15  8:07 PM  Result Value Ref Range Status   Specimen Description BLOOD RIGHT ANTECUBITAL  Final   Special Requests BOTTLES DRAWN AEROBIC AND ANAEROBIC 5CC  Final   Culture NO GROWTH < 24 HOURS  Final   Report Status PENDING  Incomplete  Culture, blood (routine x 2)     Status: None (Preliminary result)   Collection Time: 12/06/15  8:16 PM  Result Value Ref Range Status   Specimen Description BLOOD LEFT HAND  Final   Special Requests IN PEDIATRIC BOTTLE 4CC  Final   Culture NO GROWTH < 24 HOURS  Final   Report Status PENDING  Incomplete  MRSA PCR Screening     Status: Abnormal   Collection Time: 12/07/15  6:30 PM  Result Value Ref Range Status   MRSA by PCR POSITIVE (A) NEGATIVE Final    Comment:        The GeneXpert MRSA Assay (FDA approved for NASAL specimens only), is one component of a comprehensive MRSA colonization surveillance program. It is not intended to diagnose MRSA infection nor to guide or monitor treatment for MRSA infections. RESULT CALLED TO, READ BACK BY AND VERIFIED WITH: K.GARCIA,RN @2131   BY L.PITT 12/07/15      ASSESSMENT: He remains febrile but overall improved. So far cultures are negative. It does not appear that any sputum was never sent for Gram stain or culture. I will continue current antibiotics pending further observation and  culture results.  PLAN: 1. Continue vancomycin and levofloxacin pending cultures  Michel Bickers, MD [facility] for [facility] [place] [phone] pager   [phone] cell 12/08/2015, 10:30 AM

## 2015-12-08 NOTE — Progress Notes (Signed)
Pharmacy Antibiotic Note  Jason [name] is a 30 y.o. male admitted on 12/06/2015 with pneumonia.  Pharmacy has been consulted for Vancocin and Levaquin dosing. Vancomycin trough is therapeutic at 19. Continue to monitor renal function and check levels/make adjustments as needed  Plan: Continue Vancomycin 500 IV every 12 hours.  Goal trough 15-20 mcg/mL.  Continue Levaquin 750mg  IV every 24 hours. Monitor clinical progress, c/s, renal function, abx plan/LOT Vancomycin trough as indicated   Height: 5\' 8"  (172.7 cm) Weight:  (bed scale not working) IBW/kg (Calculated) : 68.4  Temp (24hrs), Avg:98.7 F (37.1 C), Min:97.7 F (36.5 C), Max:102.3 F (39.1 C)   Recent Labs Lab 12/06/15 2008 12/06/15 2027 12/06/15 2236 12/06/15 2254 12/07/15 0319 12/08/15 0400 12/08/15 2127  WBC  --   --  22.2*  --  23.6* 24.7*  --   CREATININE 0.94  --   --   --  1.14 1.17  --   LATICACIDVEN  --  1.42  --  0.68 1.0  --   --   VANCOTROUGH  --   --   --   --   --   --  19     Allergies  Allergen Reactions  . Cefuroxime Axetil Anaphylaxis  . Ertapenem Other (See Comments)    Rash and confusion-->tolerated Imipenem   . Morphine And Related Other (See Comments)    Changed mental status, confusion, headache, visual hallucination  . Penicillins Anaphylaxis and Other (See Comments)    Tolerated Imipenem; no reaction to 7 day course of amoxicillin in 2015 Has patient had a PCN reaction causing immediate rash, facial/tongue/throat swelling, SOB or lightheadedness with hypotension: Yes Has patient had a PCN reaction causing severe rash involving mucus membranes or skin necrosis: No Has patient had a PCN reaction that required hospitalization Yes Has patient had a PCN reaction occurring within the last 10 years: No If all of the above answers are "NO", then may proceed with Cephalo  . Sulfa Antibiotics Anaphylaxis, Shortness Of Breath and Other (See Comments)  . Tessalon [Benzonatate] Anaphylaxis   . Shellfish Allergy Itching and Other (See Comments)    Took benadryl to alleviate reaction  . Nsaids Other (See Comments)    Risk of bleeding  . Miripirium Rash and Other (See Comments)    Change in mental status     Thank you for allowing [name] to participate in this patients care. Jens Som, PharmD Pager: [phone]  12/08/2015 10:21 PM

## 2015-12-08 NOTE — Progress Notes (Signed)
PULMONARY / CRITICAL CARE MEDICINE   Name: Jason [name] MRN: [id number] DOB: [birth year]    ADMISSION DATE:  12/06/2015 CONSULTATION DATE:  12/06/15  REFERRING MD:  EDP  CHIEF COMPLAINT:  Decrease in O2 sats, fever  SUBJECTIVE: remained on vent, refused chest pt   VITAL SIGNS: BP 104/75 mmHg  Pulse 88  Temp(Src) 97.9 F (36.6 C) (Oral)  Resp 12  Ht 5\' 8"  (1.727 m)  Wt   SpO2 100%  HEMODYNAMICS:    VENTILATOR SETTINGS: Vent Mode:  [-] PRVC FiO2 (%):  [30 %] 30 % Set Rate:  [12 bmp] 12 bmp Vt Set:  [540 mL-600 mL] 540 mL PEEP:  [5 cmH20] 5 cmH20 Plateau Pressure:  [20 [id number] cmH20] 21 cmH20  INTAKE / OUTPUT: I/O last 3 completed shifts: In: 1990.8 [I.V.:1250.8; NG/GT:490; IV Piggyback:250] Out: 650 [Urine:650]   PHYSICAL EXAMINATION: General: Chronically ill appearing young male on vent, awake Neuro: Awake, nods head appropriately in response to questions. HEENT: [place]/AT. PERRL Cardiovascular: RRR, no M/R/G.  Lungs: ronchi mild Abdomen: PEG in place, BS x 4, soft, NT/ND.  Musculoskeletal: Muscle wasting bilaterally.  Bilateral ankles in boots. No peripheral edema.  Skin: Intact, warm, no rashes.  LABS:  BMET  Recent Labs Lab 12/06/15 2008 12/07/15 0319 12/08/15 0400  NA 130* 132* 132*  K 4.8 5.0 4.5  CL 103 101 109  CO2 18* 19* 15*  BUN 27* 29* 36*  CREATININE 0.94 1.14 1.17  GLUCOSE 217* 254* 233*    Electrolytes  Recent Labs Lab 12/06/15 2008 12/07/15 0319 12/08/15 0400  CALCIUM 12.7* 12.8* 12.0*  MG  --  1.7  --   PHOS  --  3.5  --     CBC  Recent Labs Lab 12/06/15 2236 12/07/15 0319 12/08/15 0400  WBC 22.2* 23.6* 24.7*  HGB 7.5* 7.4* 6.8*  HCT 25.4* 24.6* 21.8*  PLT 752* 717* 666*    Coag's No results for input(s): APTT, INR in the last 168 hours.  Sepsis Markers  Recent Labs Lab 12/06/15 2027 12/06/15 2254 12/07/15 0318 12/07/15 0319 12/08/15 0400  LATICACIDVEN 1.42 0.68  --  1.0  --   PROCALCITON  --    --  6.53  --  7.36    ABG  Recent Labs Lab 12/07/15 0545  PHART 7.369  PCO2ART 30.5*  PO2ART 61.0*    Liver Enzymes  Recent Labs Lab 12/06/15 2008  AST 15  ALT 10*  ALKPHOS 102  BILITOT 0.4  ALBUMIN 2.6*    Cardiac Enzymes No results for input(s): TROPONINI, PROBNP in the last 168 hours.  Glucose  Recent Labs Lab 12/07/15 1225 12/07/15 1803 12/07/15 2019 12/08/15 0014 12/08/15 0428 12/08/15 0752  GLUCAP 177* 243* 167* 142* 220* 150*    Imaging Dg Chest Port 1 View  12/08/2015  CLINICAL DATA:  Acute respiratory failure. EXAM: PORTABLE CHEST 1 VIEW COMPARISON:  12/07/2015. FINDINGS: Tracheostomy tube in stable position. Heart size stable. Persistent bibasilar atelectasis and/or infiltrates. Small left pleural effusion. No pneumothorax. Right costophrenic angle not imaged. IMPRESSION: 1. Tracheostomy tube in stable position. 2. Persistent bibasilar atelectasis and/or infiltrates without interim clearing. Small left pleural effusion again noted. Electronically Signed   By: Marcello Moores  Register   On: 12/08/2015 07:09     STUDIES:  CXR 02/28 > bibasilar airspace densities concerning for PNA.  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 Probable HCAP vs tracheitis. ? Component of Pulmonary Edema with small bilateral effusions P:   Maintain ABX regimen, see ID Secretions are low, pcxr with unchanged ATX D/w RT, to have trach collar goal 4 hrs max today then back to pos pressure Concern is atx off pos pressure will not allow him to progress today pcxr in am at risk collapse Even balance  goals uprght as able Chest pt as he permits Add mucomyst's with persistent bilateral atx bases  CARDIOVASCULAR A:  Chronic hypotension - on midodrine. P:  midodrine Assess lactate - wnl, no repeat needed  RENAL A:   Hyponatremia - chronic NONAG acidosis P:   Reduce NS to 50 ml/hr- dc Add bicarb for NONAG BMP in AM  GASTROINTESTINAL A:   GERD, gastroparesis Nutrition P:   Pantoprazole NPO on vent TF's to goal  HEMATOLOGIC A:   Anemia - chronic, some dilution now Thrombocytosis - chronic VTE Prophylaxis P:  Transfuse for Hgb < 7 SCD's / heparin CBC in AM after 1 unit today  INFECTIOUS A:   UTI - hx pseudomonal UTI in past with resistance to multiple abx Probable HCAP vs tracheitis Hx sacral wounds Some PCT rise about the same clinically P:   Abx as above (vanc / levaquin). Wound care consult Consult ID appreciated  ENDOCRINE A:   DM. P:   SSI  NEUROLOGIC A:   Hx seizures (not on meds), paraplegia, fibromyalgia, diabetic neuropathy P:   Fentanyl PRN pain May need restart of  fentanyl patch  Resume bupropion, lorazepam, & lyrica   Family updated: Mother at bedside 3/1 updated, pt updated 3/2  Interdisciplinary Family Meeting v Palliative Care Meeting:  Due by: 12/12/15  Lavon Paganini. Titus Mould, MD, [place] Pgr: [facility]

## 2015-12-08 NOTE — Progress Notes (Addendum)
Wound dressings changed this morning, pt is still having BM's via rectum.  Wound dressing was covering the anus and stool was backed up under the dressings.  17mcg of Fentanyl was given prior to dressing changes.  There are 3 wounds - sacrum, R ishium, and possibly a new one on the buttocks.  Wounds were measured, cleansed, and dressed. WOC should be coming for possible wound vac placement today?

## 2015-12-08 NOTE — Progress Notes (Addendum)
Inpatient Diabetes Program Recommendations  AACE/ADA: New Consensus Statement on Inpatient Glycemic Control (2015)  Target Ranges:  Prepandial:   less than 140 mg/dL      Peak postprandial:   less than 180 mg/dL (1-2 hours)      Critically ill patients:  140 - 180 mg/dL  Results for EDVIN, IDDINGS (MRN [id number]) as of 12/08/2015 10:41  Ref. Range 09/02/2015 07:39 09/02/2015 11:56 12/07/2015 07:49 12/07/2015 12:25 12/07/2015 18:03 12/07/2015 20:19 12/08/2015 00:14 12/08/2015 04:28 12/08/2015 07:52  Glucose-Capillary Latest Ref Range: 65-99 mg/dL 115 (H) 273 (H) 274 (H) 177 (H) 243 (H) 167 (H) 142 (H) 220 (H) 150 (H)  Review of Glycemic Control  Diabetes history: DM1 Outpatient Diabetes medications: Lantus 15 units QHS, Novolog 0-10 units QID Current orders for Inpatient glycemic control: Novolog 0-15 units Q4H  Inpatient Diabetes Program Recommendations: Insulin - Basal: Patient has Type 1 diabetes and will require basal insulin. Patient received a total of Novolog 28 units for correction over the past 24 hours. Please consider ordering Lantus 14 units Q24H starting now (based on 70 kg x 0.2 units). Insulin-Correction: If basal insulin is ordered as requested, please consider decreasing Novolog correction to sensitive scale. Since patient has a history of Type 1 diabetes, he is very sensitive to insulin. Insulin-Tube Feeding Coverage: Please consider ordering Novolog 2 units Q4H for tube feeding coverage (in addition to Novolog correction scale).  Thanks, Barnie Alderman, RN, MSN, CDE Diabetes Coordinator Inpatient Diabetes Program [phone] (Team Pager from [place] to [place]) [phone] ([place] office) [phone] [facility] office) [phone] [facility] office)

## 2015-12-08 NOTE — Progress Notes (Signed)
[place] Progress Note Patient Name: Jason [name] DOB: 05/27/1986 MRN: [id number]   Date of Service  12/08/2015  HPI/Events of Note  Hb=6.8  eICU Interventions  Transfuse 1 unit ordered.      Intervention Category Major Interventions: Hemorrhage - evaluation and management  Laverle Hobby 12/08/2015, 4:47 AM

## 2015-12-08 NOTE — Progress Notes (Signed)
Pt has a fever of 102.3, antipyretics are contraindicated based on pts allergies.  Four ice packs have been applied, temperature in room decreased. RN will continue to monitor.

## 2015-12-08 NOTE — Procedures (Signed)
Pt declines to have CPT performed or suction at this time.

## 2015-12-08 NOTE — Progress Notes (Signed)
[place] Progress Note Patient Name: Jason [name] DOB: Sep 03, 1986 MRN: [id number]   Date of Service  12/08/2015  HPI/Events of Note  Patient with ongoing muscle spasms and pain.  Had restarted home meds of lyrica, balcofen, lorazepam.  PN mentioned restarting fentanyl patch.  HD stable.  eICU Interventions  Plan: Duragesic patch 50 mcg q72 hours Increased dose of PRN fentanyl to 50 to 100 mcg IV q2 hours.     Intervention Category Intermediate Interventions: Pain - evaluation and management  DETERDING,ELIZABETH 12/08/2015, 11:54 PM

## 2015-12-08 NOTE — Progress Notes (Signed)
Pt declines CPT and suction at this time.

## 2015-12-08 NOTE — Progress Notes (Signed)
CRITICAL VALUE ALERT  Critical value received:  hgb - 6.8  Date of notification:  12/08/15  Time of notification:  0430  Critical value read back: Yes  Nurse who received alert:  Remo Lipps, RN  MD notified (1st page):  ELINK  Time of first page:  [phone]  Orders in place to transfuse

## 2015-12-09 ENCOUNTER — Inpatient Hospital Stay (HOSPITAL_COMMUNITY): Payer: Medicaid Other

## 2015-12-09 DIAGNOSIS — Z933 Colostomy status: Secondary | ICD-10-CM

## 2015-12-09 DIAGNOSIS — Z930 Tracheostomy status: Secondary | ICD-10-CM

## 2015-12-09 LAB — CBC WITH DIFFERENTIAL/PLATELET
BASOS ABS: 0 10*3/uL (ref 0.0–0.1)
Basophils Relative: 0 %
Eosinophils Absolute: 0.2 10*3/uL (ref 0.0–0.7)
Eosinophils Relative: 1 %
HEMATOCRIT: 25.4 % — AB (ref 39.0–52.0)
HEMOGLOBIN: 7.7 g/dL — AB (ref 13.0–17.0)
LYMPHS ABS: 1.5 10*3/uL (ref 0.7–4.0)
LYMPHS PCT: 6 %
MCH: 23.8 pg — AB (ref 26.0–34.0)
MCHC: 30.3 g/dL (ref 30.0–36.0)
MCV: 78.4 fL (ref 78.0–100.0)
Monocytes Absolute: 3.2 10*3/uL — ABNORMAL HIGH (ref 0.1–1.0)
Monocytes Relative: 13 %
NEUTROS ABS: 19.9 10*3/uL — AB (ref 1.7–7.7)
NEUTROS PCT: 80 %
Platelets: 678 10*3/uL — ABNORMAL HIGH (ref 150–400)
RBC: 3.24 MIL/uL — AB (ref 4.22–5.81)
RDW: 14.3 % (ref 11.5–15.5)
WBC: 24.9 10*3/uL — AB (ref 4.0–10.5)

## 2015-12-09 LAB — TYPE AND SCREEN
ABO/RH(D): O POS
ANTIBODY SCREEN: NEGATIVE
Unit division: 0

## 2015-12-09 LAB — BASIC METABOLIC PANEL
ANION GAP: 10 (ref 5–15)
BUN: 30 mg/dL — AB (ref 6–20)
CHLORIDE: 106 mmol/L (ref 101–111)
CO2: 21 mmol/L — ABNORMAL LOW (ref 22–32)
Calcium: 12.1 mg/dL — ABNORMAL HIGH (ref 8.9–10.3)
Creatinine, Ser: 0.74 mg/dL (ref 0.61–1.24)
GFR calc Af Amer: >60 mL/min (ref >60)
Glucose, Bld: 214 mg/dL — ABNORMAL HIGH (ref 65–99)
POTASSIUM: 4.1 mmol/L (ref 3.5–5.1)
SODIUM: 137 mmol/L (ref 135–145)

## 2015-12-09 LAB — GLUCOSE, CAPILLARY
GLUCOSE-CAPILLARY: 295 mg/dL — AB (ref 65–99)
GLUCOSE-CAPILLARY: 309 mg/dL — AB (ref 65–99)
Glucose-Capillary: 118 mg/dL — ABNORMAL HIGH (ref 65–99)
Glucose-Capillary: 184 mg/dL — ABNORMAL HIGH (ref 65–99)
Glucose-Capillary: 254 mg/dL — ABNORMAL HIGH (ref 65–99)
Glucose-Capillary: 295 mg/dL — ABNORMAL HIGH (ref 65–99)
Glucose-Capillary: 320 mg/dL — ABNORMAL HIGH (ref 65–99)

## 2015-12-09 IMAGING — CR DG CHEST 1V PORT
1 series · 1 of 1 positions shown · non-contrast
Comparison: 09/29/2013.

CLINICAL DATA: Intubation.

EXAM:
PORTABLE CHEST - 1 VIEW

[AP]
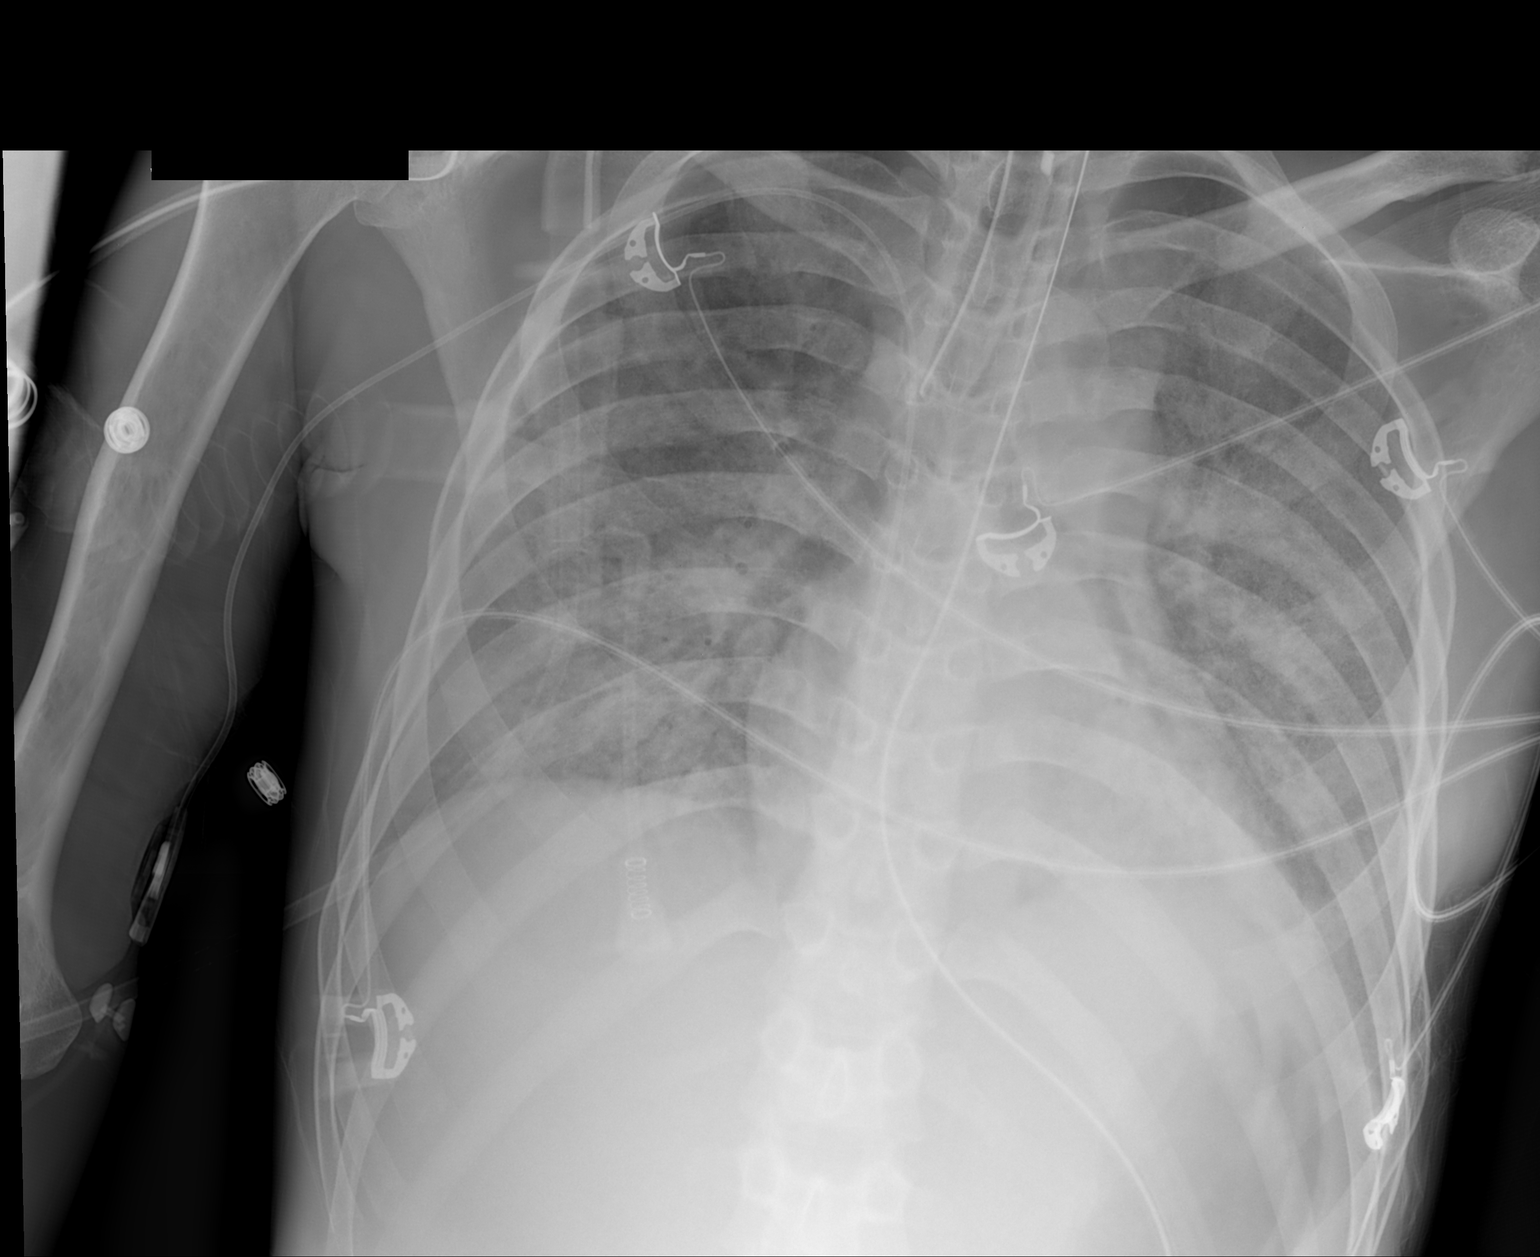

[1 of 1 positions shown; findings below may reference images not displayed]

FINDINGS: Endotracheal tube noted with its tip approximately 2 cm above the
carina . Slight proximal repositioning may prove useful. NG tube tip
noted below left hemidiaphragm. PICC line noted with its tip
projected over the cavoatrial junction. Heart size normal.
Persistent diffuse bilateral airspace disease. This has improved
slightly from prior exam. No pleural effusion or pneumothorax. No
acute osseous abnormality.
IMPRESSION: 1. Endotracheal tube noted with its tip approximately 2 cm above the
carina. Slight proximal repositioning may prove useful . NG tube and
PICC line in good anatomic position.
2. Persistent bilateral pulmonary airspace disease suggesting ARDS
and or pneumonia. This has improved slightly from prior exam.
These results will be called to the ordering clinician or
representative by the Radiologist Assistant, and communication
documented in the PACS Dashboard.

## 2015-12-09 MED ORDER — SODIUM BICARBONATE 8.4 % IV SOLN
INTRAVENOUS | Status: DC
  Administered 2015-12-09: 18:00:00 via INTRAVENOUS
  Filled 2015-12-09 (×2): qty 150

## 2015-12-09 MED ORDER — WHITE PETROLATUM GEL
Status: AC
  Administered 2015-12-09: 0.2
  Filled 2015-12-09: qty 1

## 2015-12-09 NOTE — Procedures (Signed)
Pt taken off of vent and put on trach collar. Pt is stable at this time RT will continue to monitor.

## 2015-12-09 NOTE — Progress Notes (Signed)
Inpatient Diabetes Program Recommendations  AACE/ADA: New Consensus Statement on Inpatient Glycemic Control (2015)  Target Ranges:  Prepandial:   less than 140 mg/dL      Peak postprandial:   less than 180 mg/dL (1-2 hours)      Critically ill patients:  140 - 180 mg/dL  Results for Jason [name], Jason [name] (MRN [id number]) as of 12/09/2015 09:59  Ref. Range 12/08/2015 07:52 12/08/2015 12:20 12/08/2015 16:39 12/08/2015 20:12 12/08/2015 23:56 12/09/2015 04:22 12/09/2015 07:46  Glucose-Capillary Latest Ref Range: 65-99 mg/dL 150 (H) 221 (H) 404 (H) 322 (H) 320 (H) 184 (H) 118 (H)   Review of Glycemic Control  Diabetes history: DM1 Outpatient Diabetes medications: Lantus 15 units QHS, Novolog 0-10 units QID Current orders for Inpatient glycemic control: Lantus 15 units QHS, Novolog 0-20 units Q4H  Inpatient Diabetes Program Recommendations: Insulin - Basal: Noted Lantus 15 units QHS was started last night and fasting glucose is 118 mg/dl this morning. Correction (SSI): Noted Novolog correction scale was increased last night and that tube feeding is currently on hold. Concerned about hypoglycemia with current correction scale. Please decrease correction to Sensitive scale. Patient has Type 1 diabetes and is very sensitive to insulin. Insulin-Tube Feeding Coverage: If tube feeding is resumed, please consider ordering Novolog 2 units Q4H for tube feeding coverage (in addition to Novolog correction scale).  Thanks, Barnie Alderman, RN, MSN, CDE Diabetes Coordinator Inpatient Diabetes Program [phone] (Team Pager from [place] to [place]) [phone] ([place] office) [phone] [facility] office) [phone] [facility] office)

## 2015-12-09 NOTE — Progress Notes (Signed)
Patient ID: Jason [name], male   DOB: [birth year], 30 y.o.   MRN: [id number]         [facility]    Date of Admission:  12/06/2015   Total days of antibiotics 3         Principal Problem:   HCAP (healthcare-associated pneumonia) Active Problems:   Acute on chronic respiratory failure ([name])   Recurrent UTI   Pressure ulcer   Neurogenic bladder   Respiratory failure, chronic ([name])   Paraplegia ([name])   Acute respiratory failure ([name])   Pneumonia   . acetylcysteine  4 mL Nebulization BID  . antiseptic oral rinse  7 mL Mouth Rinse QID  . baclofen  20 mg Oral Q6H  . buPROPion  150 mg Oral Daily  . chlorhexidine gluconate  15 mL Mouth Rinse BID  . fentaNYL  50 mcg Transdermal Q72H  . heparin  5,000 Units Subcutaneous 3 times per day  . insulin aspart  0-20 Units Subcutaneous 6 times per day  . insulin glargine  15 Units Subcutaneous QHS  . levofloxacin (LEVAQUIN) IV  750 mg Intravenous Q24H  . midodrine  20 mg Per Tube TID WC  . pantoprazole (PROTONIX) IV  40 mg Intravenous QHS  . pregabalin  100 mg Per Tube TID  . vancomycin  500 mg Intravenous Q12H    Subjective:  He nods his head yes to indicate that he is feeling better.  Review of Systems: Review of Systems  Unable to perform ROS: patient nonverbal    Past Medical History  Diagnosis Date  . GERD (gastroesophageal reflux disease)   . Asthma   . Hx MRSA infection     on face  . Gastroparesis   . Diabetic neuropathy ([place])   . Seizures ([name])   . Family history of anesthesia complication     Pt mother can't have epidural procedures  . Dysrhythmia   . Pneumonia   . Arthritis   . Fibromyalgia   . 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 Type I diabetes mellitus ([place])     sees Dr. Loanne Drilling   . Syncope 02/16/2015  . Multiple allergies 11/02/2015  . Recurrent UTI 11/02/2015    Social History  Substance Use Topics  . Smoking status: Former Smoker -- 1.00  packs/day for 12 years    Types: Cigars, Cigarettes    Quit date: 09/07/2014  . Smokeless tobacco: Never Used  . Alcohol Use: No    Family History  Problem Relation Age of Onset  . Diabetes Father   . Hypertension Father   . Asthma      fhx  . Hypertension      fhx  . Stroke      fhx  . Heart disease Mother    Allergies  Allergen Reactions  . Cefuroxime Axetil Anaphylaxis  . Ertapenem Other (See Comments)    Rash and confusion-->tolerated Imipenem   . Morphine And Related Other (See Comments)    Changed mental status, confusion, headache, visual hallucination  . Penicillins Anaphylaxis and Other (See Comments)    Tolerated Imipenem; no reaction to 7 day course of amoxicillin in 2015 Has patient had a PCN reaction causing immediate rash, facial/tongue/throat swelling, SOB or lightheadedness with hypotension: Yes Has patient had a PCN reaction causing severe rash involving mucus membranes or skin necrosis: No Has patient had a PCN reaction that required hospitalization Yes Has patient had a PCN reaction occurring  within the last 10 years: No If all of the above answers are "NO", then may proceed with Cephalo  . Sulfa Antibiotics Anaphylaxis, Shortness Of Breath and Other (See Comments)  . Tessalon [Benzonatate] Anaphylaxis  . Shellfish Allergy Itching and Other (See Comments)    Took benadryl to alleviate reaction  . Nsaids Other (See Comments)    Risk of bleeding  . Miripirium Rash and Other (See Comments)    Change in mental status    OBJECTIVE: Filed Vitals:   12/09/15 0740 12/09/15 0749 12/09/15 0936 12/09/15 1124  BP:  104/74    Pulse:  84 102 95  Temp:  100.3 F (37.9 C)    TempSrc:  Oral    Resp:  13 24 23   Height:      SpO2: 100% 100% 99% 99%   There is no weight on file to calculate BMI.  Physical Exam  Constitutional:  He is alert. He is off the ventilator and back on trach collar oxygen.  Cardiovascular: Normal rate and regular rhythm.   No murmur  heard. Pulmonary/Chest: Effort normal and breath sounds normal. He has no wheezes. He has no rales.  Coarse rhonchi, right greater than left.  Abdominal:  He has been having intermittent bowel movements per rectum despite having a colostomy. His mother states that there has not been any problem with sacral wound contamination.  Musculoskeletal:  Gauze dressings on left foot are clean and dry.  Skin: No rash noted.  I spoke with the wound care nurse yesterday afternoon and she indicated that his sacral and foot wounds all look better and are without any sign of infection.    Lab Results Lab Results  Component Value Date   WBC 24.9* 12/09/2015   HGB 7.7* 12/09/2015   HCT 25.4* 12/09/2015   MCV 78.4 12/09/2015   PLT 678* 12/09/2015    Lab Results  Component Value Date   CREATININE 0.74 12/09/2015   BUN 30* 12/09/2015   NA 137 12/09/2015   K 4.1 12/09/2015   CL 106 12/09/2015   CO2 21* 12/09/2015    Lab Results  Component Value Date   ALT 10* 12/06/2015   AST 15 12/06/2015   ALKPHOS 102 12/06/2015   BILITOT 0.4 12/06/2015     Microbiology: Recent Results (from the past 240 hour(s))  Culture, blood (routine x 2)     Status: None (Preliminary result)   Collection Time: 12/06/15  8:07 PM  Result Value Ref Range Status   Specimen Description BLOOD RIGHT ANTECUBITAL  Final   Special Requests BOTTLES DRAWN AEROBIC AND ANAEROBIC 5CC  Final   Culture NO GROWTH 3 DAYS  Final   Report Status PENDING  Incomplete  Culture, blood (routine x 2)     Status: None (Preliminary result)   Collection Time: 12/06/15  8:16 PM  Result Value Ref Range Status   Specimen Description BLOOD LEFT HAND  Final   Special Requests IN PEDIATRIC BOTTLE 4CC  Final   Culture NO GROWTH 3 DAYS  Final   Report Status PENDING  Incomplete  Urine culture     Status: None   Collection Time: 12/06/15 11:03 PM  Result Value Ref Range Status   Specimen Description URINE, CATHETERIZED  Final   Special Requests  NONE  Final   Culture MULTIPLE SPECIES PRESENT, SUGGEST RECOLLECTION  Final   Report Status 12/08/2015 FINAL  Final  Culture, blood (Routine X 2) w Reflex to ID Panel     Status: None (  Preliminary result)   Collection Time: 12/07/15  3:07 AM  Result Value Ref Range Status   Specimen Description BLOOD RIGHT ARM  Final   Special Requests BOTTLES DRAWN AEROBIC AND ANAEROBIC 5ML  Final   Culture NO GROWTH 2 DAYS  Final   Report Status PENDING  Incomplete  Culture, blood (Routine X 2) w Reflex to ID Panel     Status: None (Preliminary result)   Collection Time: 12/07/15  3:15 AM  Result Value Ref Range Status   Specimen Description BLOOD RIGHT HAND  Final   Special Requests IN PEDIATRIC BOTTLE 4ML  Final   Culture NO GROWTH 2 DAYS  Final   Report Status PENDING  Incomplete  MRSA PCR Screening     Status: Abnormal   Collection Time: 12/07/15  6:30 PM  Result Value Ref Range Status   MRSA by PCR POSITIVE (A) NEGATIVE Final    Comment:        The GeneXpert MRSA Assay (FDA approved for NASAL specimens only), is one component of a comprehensive MRSA colonization surveillance program. It is not intended to diagnose MRSA infection nor to guide or monitor treatment for MRSA infections. RESULT CALLED TO, READ BACK BY AND VERIFIED WITH: K.GARCIA,RN @2131  BY L.PITT 12/07/15      ASSESSMENT: He seems to be improving on empiric therapy for HCAP.   PLAN: 1. Continue vancomycin and levofloxacin for 4 more days 2. Please call Dr. Talbot Grumbling [phone]) for any infectious disease questions this weekend  Michel Bickers, MD [facility] for [place] [phone] pager   [phone] cell 12/09/2015, 12:19 PM

## 2015-12-09 NOTE — Progress Notes (Addendum)
Pharmacy Antibiotic Note  HPI: [name] is a 30 y.o. male admitted on 12/06/2015 with pneumonia. He has a history of multiple antibiotic allergies/intolerances and is well known to the ID service given numerous admissions for infections with multidrug resistant pathogens.   He has significant healthcare and antibiotic exposure, most recently with:  -Fosfomycin for UTI in December 2016 -4 weeks of empiric Cipro/Flagyl for cdiff in December 2016 -doxycycline in January 2017 for tracheitis -10 day course of Cipro starting 11/07/15 for klebsiella UTI   He also had a lengthy hospital stay in Summer 2016 during which time he grew acinetobacter calcoaceticus/baumannii in 3 separate respiratory cultures (05/08/15, 05/12/15, 05/18/15). Susceptibilities showed RESISTANCE to numerous antibiotics, including fluoroquinolones.   Given [name]'s recent antibiotic exposure and history of MDR pathogens, he remains at high risk for MDR infection with gram-negative rods. His antibiotic allergies/intolerances make his options for coverage limited, however he is currently doing well on vancomycin and Levaquin with cultures still pending.   After discussion with Dr. Megan Salon, in the event that he clinically worsens and spikes a fever today or over the weekend, we would recommend broadening coverage to vancomycin + tigecycline + aztreonam. Dr. Linus Salmons can be reached at [phone] if there are any infectious disease questions this weekend.  Plan: -Continue Vancomycin 500 mg IV every 12 hours targeting goal trough 15-20 mcg/mL -Continue Levaquin 750 mg IV every 24 hours -Broaden coverage as indicated above if he clinically worsens  Height: 5\' 8"  (172.7 cm) Weight:  (bed scale not working) IBW/kg (Calculated) : 68.4  Temp (24hrs), Avg:99 F (37.2 C), Min:98.5 F (36.9 C), Max:100.3 F (37.9 C)   Recent Labs Lab 12/06/15 2008 12/06/15 2027 12/06/15 2236 12/06/15 2254 12/07/15 0319 12/08/15 0400  12/08/15 2127 12/09/15 0345  WBC  --   --  22.2*  --  23.6* 24.7*  --  24.9*  CREATININE 0.94  --   --   --  1.14 1.17  --  0.74  LATICACIDVEN  --  1.42  --  0.68 1.0  --   --   --   VANCOTROUGH  --   --   --   --   --   --  19  --     CrCl cannot be calculated (Unknown ideal weight.).    Allergies  Allergen Reactions  . Cefuroxime Axetil Anaphylaxis  . Ertapenem Other (See Comments)    Rash and confusion-->tolerated Imipenem   . Morphine And Related Other (See Comments)    Changed mental status, confusion, headache, visual hallucination  . Penicillins Anaphylaxis and Other (See Comments)    Tolerated Imipenem; no reaction to 7 day course of amoxicillin in 2015 Has patient had a PCN reaction causing immediate rash, facial/tongue/throat swelling, SOB or lightheadedness with hypotension: Yes Has patient had a PCN reaction causing severe rash involving mucus membranes or skin necrosis: No Has patient had a PCN reaction that required hospitalization Yes Has patient had a PCN reaction occurring within the last 10 years: No If all of the above answers are "NO", then may proceed with Cephalo  . Sulfa Antibiotics Anaphylaxis, Shortness Of Breath and Other (See Comments)  . Tessalon [Benzonatate] Anaphylaxis  . Shellfish Allergy Itching and Other (See Comments)    Took benadryl to alleviate reaction  . Nsaids Other (See Comments)    Risk of bleeding  . Miripirium Rash and Other (See Comments)    Change in mental status    Antimicrobials this admission: Vanc 3/1 >>  Levaquin 3/1 >>  Primaxin x 1 2/28   Dose adjustments this admission: none  Microbiology results: 2/28 UCx >> multiple species present 2/28 BCx >> NG x 3D 3/1 BCx x2 >> NG x 2D 3/1 MRSA PCR >> POSITIVE  Thank you for allowing pharmacy to be a part of this patient's care.  Governor Specking, PharmD Clinical Pharmacy Resident Pager: [phone] 12/09/2015 3:10 PM

## 2015-12-09 NOTE — Progress Notes (Deleted)
Transferred pt to [facility]. Daughter, Lenon Oms, was informed of the transfer. 12/09/2015   .

## 2015-12-09 NOTE — Progress Notes (Addendum)
Paged DR. Byrum about Midodrine 20mg  and pts BP 160/92. MD stated to hold Midodrine and informed of pts temp of 101.3 F 12/09/2015

## 2015-12-09 NOTE — Progress Notes (Signed)
PULMONARY / CRITICAL CARE MEDICINE   Name: Jason [name] MRN: [id number] DOB: December 22, 1985    ADMISSION DATE:  12/06/2015 CONSULTATION DATE:  12/06/15  REFERRING MD:  EDP  CHIEF COMPLAINT:  Decrease in O2 sats, fever  SUBJECTIVE: remained on TC now since 10 am~!   VITAL SIGNS: BP 137/93 mmHg  Pulse 99  Temp(Src) 98.6 F (37 C) (Oral)  Resp 25  Ht 5\' 8"  (1.727 m)  Wt   SpO2 99%  HEMODYNAMICS:    VENTILATOR SETTINGS: Vent Mode:  [-] PRVC FiO2 (%):  [28 %-30 %] 28 % Set Rate:  [12 bmp] 12 bmp Vt Set:  [540 mL] 540 mL PEEP:  [5 cmH20] 5 cmH20 Plateau Pressure:  [19 cmH20-21 cmH20] 21 cmH20  INTAKE / OUTPUT: I/O last 3 completed shifts: In: [id number] [I.V.:1790; Blood:198; NG/GT:2230; IV Piggyback:600] Out: 67 [Urine:2580; Stool:1850]   PHYSICAL EXAMINATION: General: Chronically ill appearing young male on vent, awakens easily Neuro: Awake, appropriate, he wants to talk HEENT: trach site clean PERRL Cardiovascular: RRR, no M/R/G.  Lungs: ronchi cl;eared Abdomen: PEG in place, BS x 4, soft, NT/ND.  ostomy Musculoskeletal: Muscle wasting bilaterally.  Bilateral ankles in boots. No peripheral edema.  Skin: Intact, warm, no rashes.  LABS:  BMET  Recent Labs Lab 12/07/15 0319 12/08/15 0400 12/09/15 0345  NA 132* 132* 137  K 5.0 4.5 4.1  CL 101 109 106  CO2 19* 15* 21*  BUN 29* 36* 30*  CREATININE 1.14 1.17 0.74  GLUCOSE 254* 233* 214*    Electrolytes  Recent Labs Lab 12/07/15 0319 12/08/15 0400 12/09/15 0345  CALCIUM 12.8* 12.0* 12.1*  MG 1.7  --   --   PHOS 3.5  --   --     CBC  Recent Labs Lab 12/07/15 0319 12/08/15 0400 12/09/15 0345  WBC 23.6* 24.7* 24.9*  HGB 7.4* 6.8* 7.7*  HCT 24.6* 21.8* 25.4*  PLT 717* 666* 678*    Coag's No results for input(s): APTT, INR in the last 168 hours.  Sepsis Markers  Recent Labs Lab 12/06/15 2027 12/06/15 2254 12/07/15 0318 12/07/15 0319 12/08/15 0400  LATICACIDVEN 1.42 0.68  --  1.0   --   PROCALCITON  --   --  6.53  --  7.36    ABG  Recent Labs Lab 12/07/15 0545  PHART 7.369  PCO2ART 30.5*  PO2ART 61.0*    Liver Enzymes  Recent Labs Lab 12/06/15 2008  AST 15  ALT 10*  ALKPHOS 102  BILITOT 0.4  ALBUMIN 2.6*    Cardiac Enzymes No results for input(s): TROPONINI, PROBNP in the last 168 hours.  Glucose  Recent Labs Lab 12/08/15 1639 12/08/15 2012 12/08/15 2356 12/09/15 0422 12/09/15 0746 12/09/15 1249  GLUCAP 404* 322* 320* 184* 118* 254*    Imaging Dg Chest Port 1 View  12/09/2015  CLINICAL DATA:  Pneumonia. EXAM: PORTABLE CHEST 1 VIEW COMPARISON:  12/08/2015. FINDINGS: Tracheostomy to in stable position. Mediastinum hilar structures normal. Low lung volumes with persistent bibasilar atelectasis and/or infiltrates again noted. Small pleural effusions again noted. No pneumothorax. IMPRESSION: 1. Tracheostomy tube in stable position. 2. Low lung volumes with persistent bibasilar atelectasis and/or infiltrates. Persistent bilateral pleural effusions. Similar findings noted on prior exam. Electronically Signed   By: [place]   On: 12/09/2015 07:20     STUDIES:  CXR 02/28 > bibasilar airspace densities concerning for PNA.  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 Probable HCAP vs tracheitis. ? Component of Pulmonary Edema with small bilateral effusions P:   Maintain ABX regimen, see ID, stop date rcs noted TC goal 8-12 hours toay then noctunral rest one more night If does well today would then use TC 24 hr per day Add PMV  today, secretions are better Chest pt  CARDIOVASCULAR A:  Chronic hypotension - on midodrine. P:  Midodrine Cuff pressures  RENAL A:   Hyponatremia - chronic NONAG acidosis P:   Bicarb x 1 more liter then dc BMP in AM  GASTROINTESTINAL A:   GERD, gastroparesis Nutrition R/o chronic fistula P:   Pantoprazole NPO on vent TF's to goal Get CT in am for concerns for mom for fistula to ostomy  HEMATOLOGIC A:   Anemia - chronic, some dilution now Thrombocytosis - chronic VTE Prophylaxis P:  Transfuse for Hgb < 7 SCD's / heparin CBc in am for prior need blood day prior  INFECTIOUS A:   UTI - hx pseudomonal UTI in past with resistance to multiple abx Probable HCAP vs tracheitis Hx sacral wounds Some PCT rise about the same clinically P:   Abx as above (vanc / levaquin), ID noted 4 more days  ENDOCRINE A:   DM. P:   SSI lantus Appreciate ELINK help  NEUROLOGIC A:   Hx seizures (not on meds), paraplegia, fibromyalgia, diabetic neuropathy P:   Fentanyl PRN pain Resume bupropion, lorazepam, & lyrica   Family updated: Mother at bedside 3/1 updated, pt updated 3/2, 3/3  Interdisciplinary Family Meeting v Palliative Care Meeting:  Due by: 12/12/15  Lavon Paganini. Titus Mould, MD, [place] Pgr: [place] [facility]

## 2015-12-09 NOTE — Progress Notes (Signed)
Pt declines to be suctioned or have CPT performed.

## 2015-12-10 ENCOUNTER — Inpatient Hospital Stay (HOSPITAL_COMMUNITY): Payer: Medicaid Other

## 2015-12-10 DIAGNOSIS — L988 Other specified disorders of the skin and subcutaneous tissue: Secondary | ICD-10-CM | POA: Diagnosis present

## 2015-12-10 DIAGNOSIS — N319 Neuromuscular dysfunction of bladder, unspecified: Secondary | ICD-10-CM

## 2015-12-10 LAB — CORTISOL: Cortisol, Plasma: 12.7 ug/dL

## 2015-12-10 LAB — GLUCOSE, CAPILLARY
GLUCOSE-CAPILLARY: 170 mg/dL — AB (ref 65–99)
GLUCOSE-CAPILLARY: 210 mg/dL — AB (ref 65–99)
GLUCOSE-CAPILLARY: 264 mg/dL — AB (ref 65–99)
GLUCOSE-CAPILLARY: 51 mg/dL — AB (ref 65–99)
Glucose-Capillary: 47 mg/dL — ABNORMAL LOW (ref 65–99)
Glucose-Capillary: 190 mg/dL — ABNORMAL HIGH (ref 65–99)
Glucose-Capillary: 46 mg/dL — ABNORMAL LOW (ref 65–99)
Glucose-Capillary: 93 mg/dL (ref 65–99)
Glucose-Capillary: 189 mg/dL — ABNORMAL HIGH (ref 65–99)

## 2015-12-10 IMAGING — CR DG CHEST 1V PORT
1 series · 1 of 1 positions shown · non-contrast
Comparison: 09/30/2013

CLINICAL DATA: Evaluate endotracheal tube position.

EXAM:
PORTABLE CHEST - 1 VIEW

[AP]
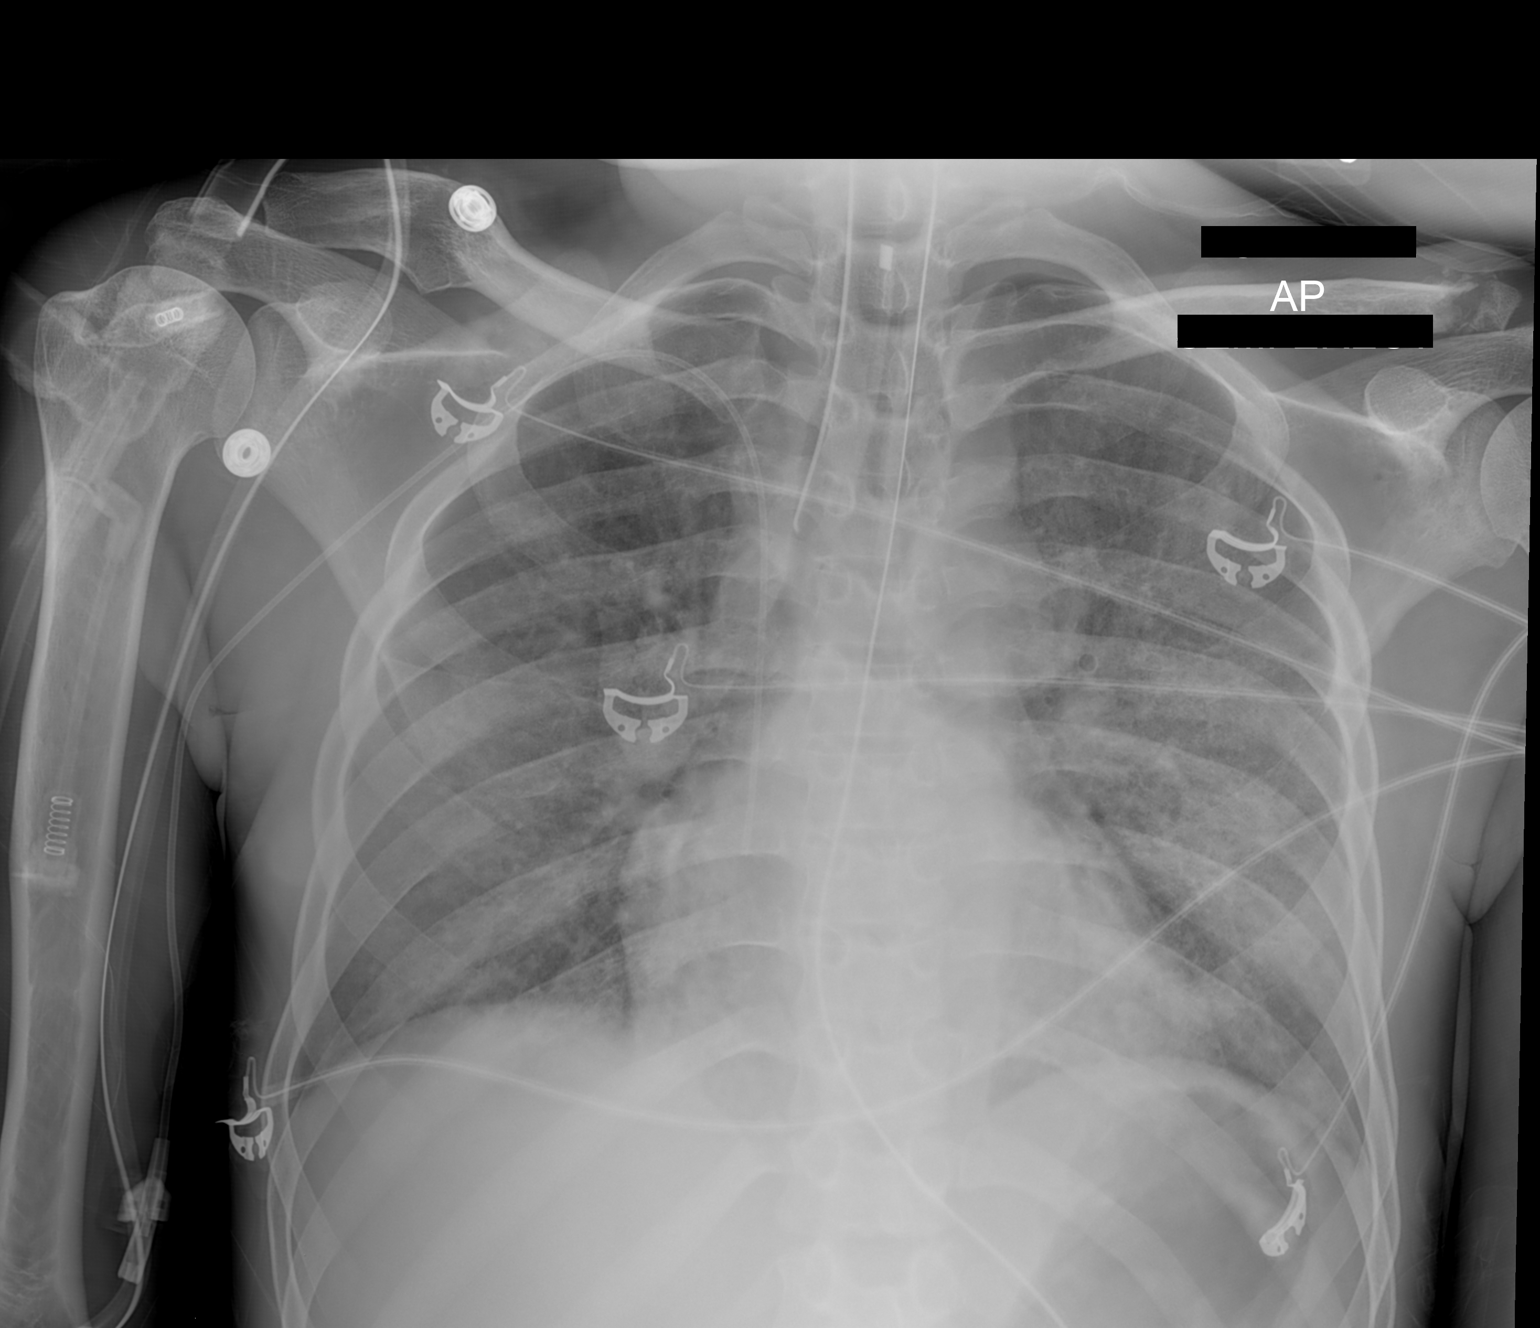

[1 of 1 positions shown; findings below may reference images not displayed]

FINDINGS: Hazy bilateral airspace opacities are stable from the previous day's
study. The cardiac silhouette is normal in size.

Right PICC and endotracheal tube as well as the nasogastric tube are
stable in well positioned.

No pneumothorax.
IMPRESSION: 1. Endotracheal tube is stable in well positioned, tip projecting 2
cm above the Kle[place] Jumper
2. Hazy bilateral airspace lung opacities are without significant
change from the previous day's study. This may reflect a edema or
ARDS or diffuse pneumonia or a combination.

## 2015-12-10 MED ORDER — GLUCERNA 1.2 CAL PO LIQD
1000.0000 mL | ORAL | Status: AC
  Administered 2015-12-10 (×2): 1000 mL
  Filled 2015-12-10 (×2): qty 1000

## 2015-12-10 MED ORDER — IOHEXOL 300 MG/ML  SOLN
25.0000 mL | INTRAMUSCULAR | Status: AC
  Administered 2015-12-10 (×2): 25 mL via ORAL

## 2015-12-10 MED ORDER — GLUCERNA 1.2 CAL PO LIQD
1000.0000 mL | ORAL | Status: DC
  Administered 2015-12-10: 1000 mL
  Filled 2015-12-10 (×3): qty 1000

## 2015-12-10 MED ORDER — DEXTROSE 50 % IV SOLN
INTRAVENOUS | Status: AC
  Administered 2015-12-10: 50 mL
  Filled 2015-12-10: qty 50

## 2015-12-10 NOTE — Progress Notes (Signed)
Pt transported to CT on vent without incident.  Pt suctioned prior to transport and upon return.  Pt then removed from vent and placed on Trach collar @ 28%.  RT will continue to monitor.

## 2015-12-10 NOTE — Progress Notes (Signed)
Glucose was 51 at 0358. Orange juice was given. Glucose rechecked at 0419 and was 47. Deterding was notified. One amp dextrose 50 was given. Glucose was 190 at 0456.

## 2015-12-10 NOTE — Plan of Care (Signed)
Problem: Health Behavior/Discharge Planning: Goal: Ability to manage health-related needs will improve Family at bedside 24/7.  They are very supportive with the care.

## 2015-12-10 NOTE — Progress Notes (Signed)
Pt removed from vent and placed on Trach collar @ 28%.  RT will continue to monitor.

## 2015-12-10 NOTE — Procedures (Signed)
Pt placed back on ventilator. Tolerated the trach collar well. RT will continue to monitor.

## 2015-12-10 NOTE — Progress Notes (Signed)
PULMONARY / CRITICAL CARE MEDICINE   Name: Jason [name] MRN: [id number] DOB: February 03, 1986    ADMISSION DATE:  12/06/2015 CONSULTATION DATE:  12/06/15  REFERRING MD:  EDP  CHIEF COMPLAINT:  Decrease in O2 sats, fever, PNA  SUBJECTIVE: trach collar started 955 am   VITAL SIGNS: BP 126/76 mmHg  Pulse 99  Temp(Src) 99.3 F (37.4 C) (Oral)  Resp 21  Ht 5\' 8"  (1.727 m)  Wt   SpO2 100%  HEMODYNAMICS:    VENTILATOR SETTINGS: Vent Mode:  [-] CPAP;PSV FiO2 (%):  [28 %-30 %] 28 % Set Rate:  [12 bmp-15 bmp] 13 bmp Vt Set:  [540 mL] 540 mL PEEP:  [5 cmH20] 5 cmH20 Pressure Support:  [12 cmH20] 12 cmH20 Plateau Pressure:  [11 cmH20-22 cmH20] 11 cmH20  INTAKE / OUTPUT: I/O last 3 completed shifts: In: 4070.5 [I.V.:1514.2; NG/GT:1956.3; IV Piggyback:600] Out: [id number] [Urine:1930; Stool:1250]   PHYSICAL EXAMINATION: General: Chronically ill appearing young male on vent, awakens easily Neuro: Awake, communicative  HEENT: trach site clean PERRL Cardiovascular: RRR, no M/R/G.  Lungs: coarse slight bases Abdomen: PEG in place, BS x 4, soft, NT/ND.  ostomy Musculoskeletal: Muscle wasting bilaterally.  Bilateral ankles in boots. No peripheral edema.  Skin: Intact, warm, no rashes.  LABS:  BMET  Recent Labs Lab 12/07/15 0319 12/08/15 0400 12/09/15 0345  NA 132* 132* 137  K 5.0 4.5 4.1  CL 101 109 106  CO2 19* 15* 21*  BUN 29* 36* 30*  CREATININE 1.14 1.17 0.74  GLUCOSE 254* 233* 214*    Electrolytes  Recent Labs Lab 12/07/15 0319 12/08/15 0400 12/09/15 0345  CALCIUM 12.8* 12.0* 12.1*  MG 1.7  --   --   PHOS 3.5  --   --     CBC  Recent Labs Lab 12/07/15 0319 12/08/15 0400 12/09/15 0345  WBC 23.6* 24.7* 24.9*  HGB 7.4* 6.8* 7.7*  HCT 24.6* 21.8* 25.4*  PLT 717* 666* 678*    Coag's No results for input(s): APTT, INR in the last 168 hours.  Sepsis Markers  Recent Labs Lab 12/06/15 2027 12/06/15 2254 12/07/15 0318 12/07/15 0319  12/08/15 0400  LATICACIDVEN 1.42 0.68  --  1.0  --   PROCALCITON  --   --  6.53  --  7.36    ABG  Recent Labs Lab 12/07/15 0545  PHART 7.369  PCO2ART 30.5*  PO2ART 61.0*    Liver Enzymes  Recent Labs Lab 12/06/15 2008  AST 15  ALT 10*  ALKPHOS 102  BILITOT 0.4  ALBUMIN 2.6*    Cardiac Enzymes No results for input(s): TROPONINI, PROBNP in the last 168 hours.  Glucose  Recent Labs Lab 12/09/15 2341 12/10/15 0358 12/10/15 0419 12/10/15 0436 12/10/15 0456 12/10/15 0750  GLUCAP 210* 51* 47* 46* 190* 170*    Imaging No results found.   STUDIES:  CXR 02/28 > bibasilar airspace densities concerning for PNA. 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 HCAP P:   Maintain ABX regimen, see ID TC goal is 24 hrs Vent for CT PMV daytime with trach  collar Chest pt as pt allows  CARDIOVASCULAR A:  Chronic hypotension - on midodrine. P:  Midodrine Cuff pressures diff ext tele  RENAL A:   Hyponatremia - chronic NONAG acidosis P:   Bicarb x 1 more liter then dc BMP in 48 hrs , lytes are balanced  GASTROINTESTINAL A:   GERD, gastroparesis Nutrition R/o chronic fistula P:   Pantoprazole TF's to goal, need continuous to avoid hypoglycemia Get CT this am   HEMATOLOGIC A:   Anemia - chronic, some dilution now Thrombocytosis - chronic VTE Prophylaxis P:  Transfuse for Hgb < 7 SCD's / heparin Limit phlebotomy  INFECTIOUS A:   UTI - hx pseudomonal UTI in past with resistance to multiple abx Probable HCAP vs  tracheitis Hx sacral wounds Some PCT rise about the same clinically P:   Abx as above (vanc / levaquin), ID noted 3 more days  ENDOCRINE A:   DM Hypoglycemia P:   SSI lantus dc Go back to continuous TF , follow glu trend Get cortisol Quinolones also can alter glu values  NEUROLOGIC A:   Hx seizures (not on meds), paraplegia, fibromyalgia, diabetic neuropathy P:   Fentanyl PRN pain Resume bupropion, lorazepam, & lyrica   Family updated: Mother at bedside 3/1 updated, pt updated 3/2, 3/3  Interdisciplinary Family Meeting v Palliative Care Meeting:  Due by: 12/12/15  Lavon Paganini. Titus Mould, MD, [place] Pgr: [place] [facility]

## 2015-12-10 NOTE — Progress Notes (Signed)
Pt returned to vent for upcoming CT trip.

## 2015-12-10 NOTE — Progress Notes (Signed)
Wound vac was changed twice today due to feces coming out from his rectum.  Noted that feces is sipping through the wound vac dressing.

## 2015-12-11 LAB — GLUCOSE, CAPILLARY
GLUCOSE-CAPILLARY: 172 mg/dL — AB (ref 65–99)
GLUCOSE-CAPILLARY: 151 mg/dL — AB (ref 65–99)
GLUCOSE-CAPILLARY: 302 mg/dL — AB (ref 65–99)
GLUCOSE-CAPILLARY: 245 mg/dL — AB (ref 65–99)
GLUCOSE-CAPILLARY: 160 mg/dL — AB (ref 65–99)
Glucose-Capillary: 196 mg/dL — ABNORMAL HIGH (ref 65–99)

## 2015-12-11 LAB — CULTURE, BLOOD (ROUTINE X 2)
CULTURE: NO GROWTH
Culture: NO GROWTH

## 2015-12-11 IMAGING — CR DG CHEST 1V PORT
1 series · 1 of 1 positions shown · non-contrast
Comparison: 10/01/2013

CLINICAL DATA: Respiratory failure.

EXAM:
PORTABLE CHEST - 1 VIEW

[AP]
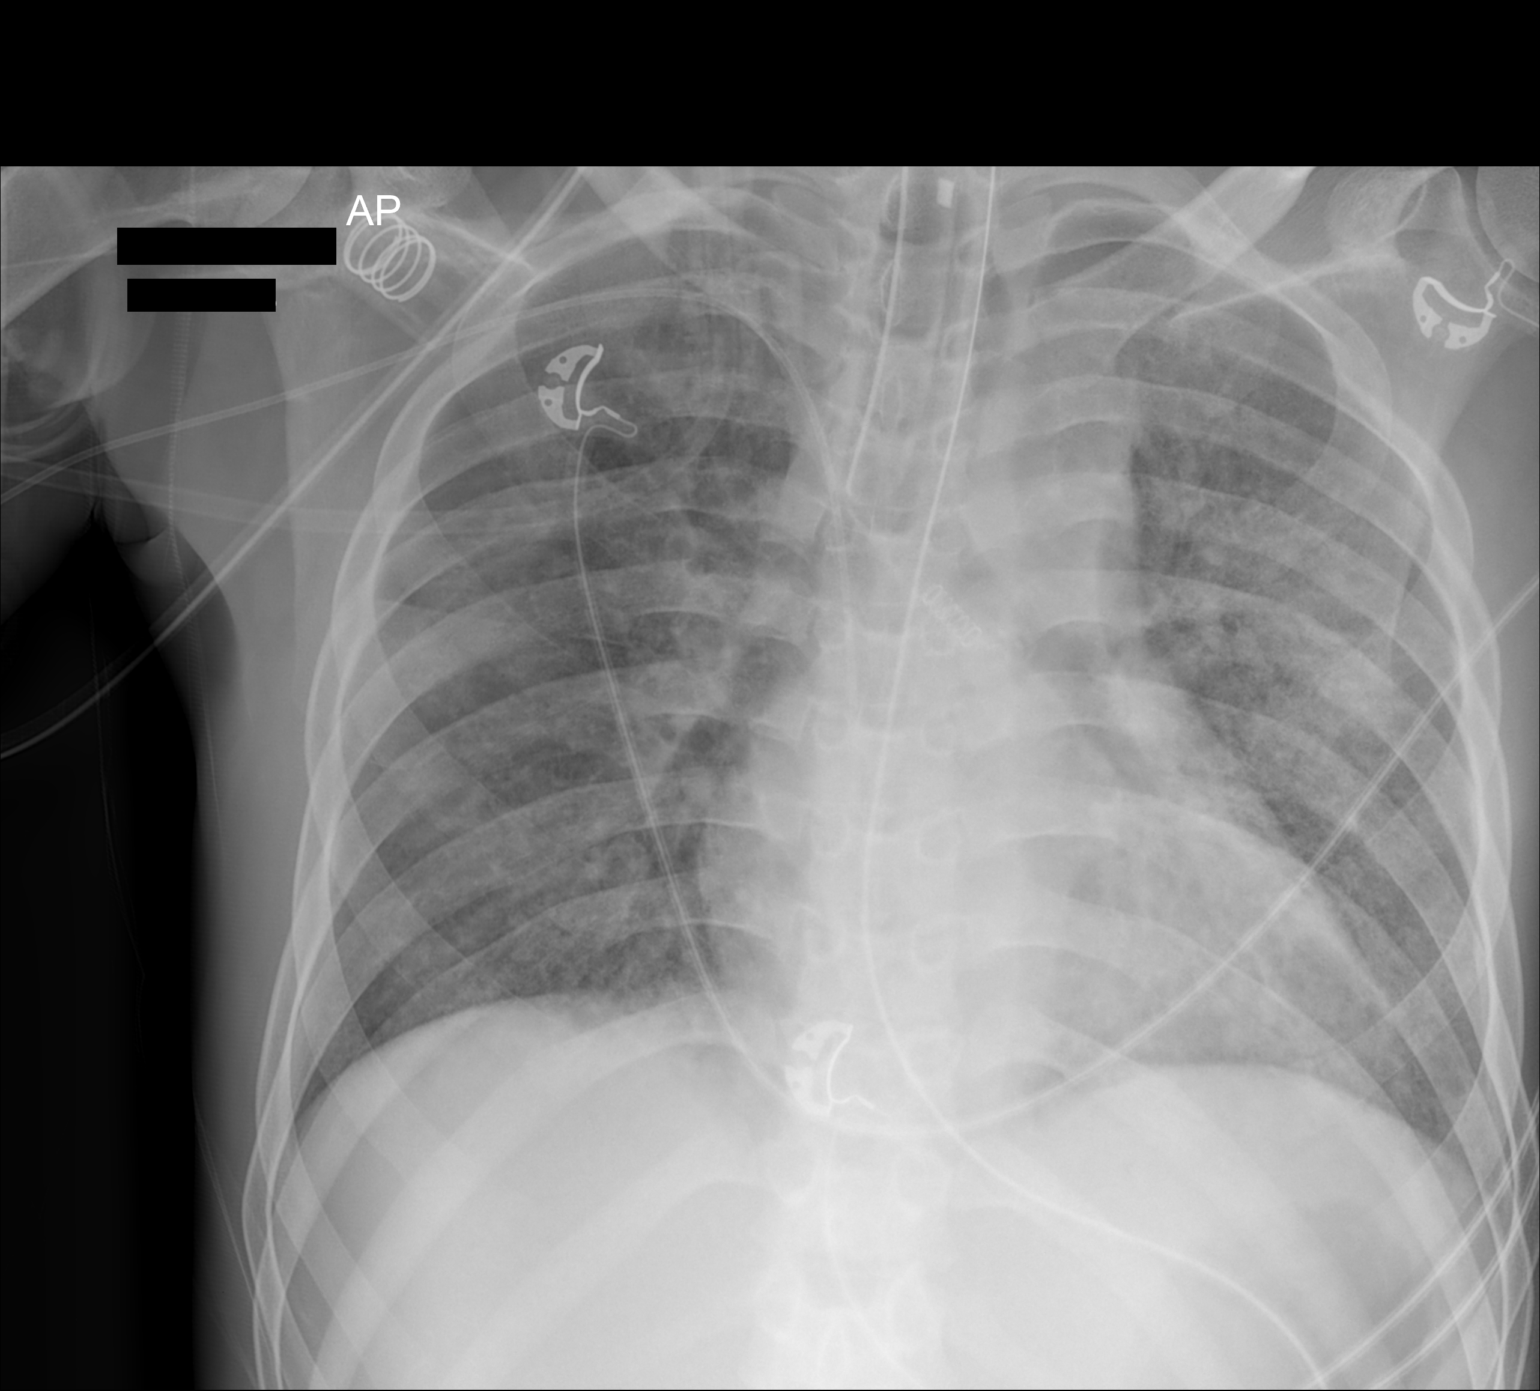

[1 of 1 positions shown; findings below may reference images not displayed]

FINDINGS: By chest x-ray the endotracheal tube now projects at the carina.
Lung shows stable aeration with some residual edema remaining. PICC
line positioning is stable.
IMPRESSION: Stable edema.  The endotracheal tube tip now projects at the carina.

## 2015-12-11 MED ORDER — GLUCERNA 1.2 CAL PO LIQD
1000.0000 mL | ORAL | Status: AC
  Administered 2015-12-11: 1000 mL
  Filled 2015-12-11 (×3): qty 1000

## 2015-12-11 NOTE — Evaluation (Signed)
[facility] - Evaluation Patient Details  Name: Jason [name] MRN: [id number] Date of Birth: [birth year]  Today's Date: 12/11/2015 Time: 1350-1420 SLP Time Calculation (min) (ACUTE ONLY): 30 min  Past Medical History:  Past Medical History  Diagnosis Date  . GERD (gastroesophageal reflux disease)   . Asthma   . Hx MRSA infection     on face  . Gastroparesis   . Diabetic neuropathy ([place])   . Seizures ([place])   . Family history of anesthesia complication     Pt mother can't have epidural procedures  . Dysrhythmia   . Pneumonia   . Arthritis   . Fibromyalgia   . 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 Type I diabetes mellitus ([place])     sees Dr. Loanne Drilling   . Syncope 02/16/2015  . Multiple allergies 11/02/2015  . Recurrent UTI 11/02/2015   Past Surgical History:  Past Surgical History  Procedure Laterality Date  . Tonsillectomy    . Multiple extractions with alveoloplasty N/A 08/03/2014    Procedure: MULTIPLE EXTRACTIONS;  Surgeon: Gae Bon, DDS;  Location: [place];  Service: Oral Surgery;  Laterality: N/A;  . Tee without cardioversion N/A 08/17/2014    Procedure: TRANSESOPHAGEAL ECHOCARDIOGRAM (TEE);  Surgeon: Dorothy Spark, MD;  Location: [facility];  Service: Cardiovascular;  Laterality: N/A;  . Debridment of decubitus ulcer N/A 10/04/2014    Procedure: DEBRIDMENT OF DECUBITUS ULCER;  Surgeon: Georganna Skeans, MD;  Location: [place];  Service: General;  Laterality: N/A;  . Laparoscopic diverted colostomy N/A 10/12/2014    Procedure: LAPAROSCOPIC DIVERTING COLOSTOMY;  Surgeon: Donnie Mesa, MD;  Location: [place];  Service: General;  Laterality: N/A;  . Insertion of suprapubic catheter N/A 10/12/2014    Procedure: INSERTION OF SUPRAPUBIC CATHETER;  Surgeon: Reece Packer, MD;  Location: [place];  Service: Urology;  Laterality: N/A;  . Incision and drainage of wound N/A 11/12/2014    Procedure: IRRIGATION AND DEBRIDEMENT OF WOUNDS WITH  BONE BIOPSY AND SURGICAL PREP ;  Surgeon: Theodoro Kos, DO;  Location: [place];  Service: Plastics;  Laterality: N/A;  . Application of a-cell of back N/A 11/12/2014    Procedure: APPLICATION A CELL AND VAC ;  Surgeon: Theodoro Kos, DO;  Location: [place];  Service: Plastics;  Laterality: N/A;  . Incision and drainage of wound N/A 11/18/2014    Procedure: IRRIGATION AND DEBRIDEMENT OF SACRAL ULCER ONLY WITH PLACEMENT OF A CELL AND VAC/ DRESSING CHANGE TO UPPER BACK AREA.;  Surgeon: Theodoro Kos, DO;  Location: [place];  Service: Plastics;  Laterality: N/A;  . Incision and drainage of wound N/A 11/25/2014    Procedure: IRRIGATION AND DEBRIDEMENT OF SACRAL ULCER AND BACK BURN WITH PLACEMENT OF A-CELL;  Surgeon: Theodoro Kos, DO;  Location: [place];  Service: Plastics;  Laterality: N/A;  . Minor application of wound vac N/A 11/25/2014    Procedure:  WOUND VAC CHANGE;  Surgeon: Theodoro Kos, DO;  Location: [place];  Service: Plastics;  Laterality: N/A;  . Incision and drainage of wound Right 12/08/2014    Procedure: IRRIGATION AND DEBRIDEMENT SACRAL WOUND AND RIGHT ISCHIAL WOUND ;  Surgeon: Theodoro Kos, DO;  Location: [place];  Service: Plastics;  Laterality: Right;  . Application of a-cell of back N/A 12/08/2014    Procedure: APPLICATION OF A-CELL AND WOUND VAC ;  Surgeon: Theodoro Kos, DO;  Location: [place];  Service: Plastics;  Laterality: N/A;  . Tee without cardioversion  N/A 05/05/2015    Procedure: TRANSESOPHAGEAL ECHOCARDIOGRAM (TEE);  Surgeon: Lelon Perla, MD;  Location: [place];  Service: Cardiovascular;  Laterality: N/A;  . Incision and drainage of wound Bilateral 05/05/2015    Procedure: IRRIGATION AND DEBRIDEMENT SACRAL ULCER;  Surgeon: Theodoro Kos, DO;  Location: [place];  Service: Plastics;  Laterality: Bilateral;  . Hematoma evacuation N/A 05/05/2015    Procedure: EVACUATION HEMATOMA bedside procedure;  Surgeon: Theodoro Kos, DO;  Location: [place];  Service: Plastics;  Laterality: N/A;  .  Incision and drainage of wound N/A 08/04/2015    Procedure: IRRIGATION AND DEBRIDEMENT OF SACRAL ULCER AND ;  Surgeon: Loel Lofty Dillingham, DO;  Location: [place];  Service: Plastics;  Laterality: N/A;  . Application of a-cell of chest/abdomen N/A 08/04/2015    Procedure: APPLICATION OF A-CELL ;  Surgeon: Loel Lofty Dillingham, DO;  Location: [place];  Service: Plastics;  Laterality: N/A;   HPI:  Jason [name] is a 30 y.o. 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 He is followed in trach clinic and he has been doing well and has not required any ventilator support for quite some time, last time roughly November 2016. He had been able to use PMV 24 hours a day without problem.  On 12/06/15, he was brought to [facility] ED due to drop in O2 saturations. This began 4 - 5 days prior and got to low of 88%. Mother began to put him on the vent for 2 - 4 hours at a time in hopes of improving O2 sats. She also noticed that his heart rate would intermittently climb into the low 100's. He has had a low grade fever, but mom reports this has been going on for over a month now. Denies any cough. He does have a hx of tracheitis and mom states a few months ago he had problems with excessive secretions. Over the past 4 - 5 days, secretions have not been to bad and mom has not been able to suction much from trach.  In ED, he had saturations in high 90's on his home vent settings. CXR revealed bibasilar densities concerning for PNA.    Assessment / Plan / Recommendation Clinical Impression  ST evaluation for PMSV placement.  The nurse and [name] reported that he was a successful PMSV user prior to admission.  ST being asked to follow up to assess for safety of PMSV placement given current hospitalization and need for short term vent management.  The patient's cuff was slowly deflated and was tolerated well with vitals remaining stable.  Finger  occlusion did not indicate any pressure which would be indicative of air trapping.  The PMSV was placed and the patient was able to phonate normally.  Vocal volume was good as well as intelligiblity.  The patient was able to communicate at the conversational level.  Vitals were completely stable and there was no coughing.  PMSV was removed roughly 1 minute after placement with no evidence of air trapping.  Recommend allowing the patient to wear the PMSV during all waking hours and with all meals.  The patient and his mother were educated on proper use of the PMSV, specifially the need to remove it while sleeping.  His Mother demonstrated appropriate removal and placement of the PMSV.  ST will follow up for tolerance as well as education.      SLP Assessment  Patient needs continued [facility]  Follow Up Recommendations   (TBD)    Frequency and Duration min 2x/week  2 weeks    PMSV Trial PMSV was placed for: 20 mins Able to redirect subglottic air through upper airway: Yes Able to Attain Phonation: Yes Voice Quality: Normal Able to Expectorate Secretions: No attempts Level of Secretion Expectoration with PMSV: Not observed Breath Support for Phonation: Adequate Intelligibility: Intelligible Respirations During Trial: 20 SpO2 During Trial: 99 % Pulse During Trial: 90 Behavior: Alert;Controlled;Cooperative;Expresses self well   Tracheostomy Tube  Additional Tracheostomy Tube Assessment Fenestrated: No Secretion Description: Thin, clear secretions Frequency of Tracheal Suctioning: Minimal Level of Secretion Expectoration: Tracheal;Oral    Vent Dependency  Vent Dependent: No FiO2 (%): 35 %    Cuff Deflation Trial  GO Tolerated Cuff Deflation: Yes Length of Time for Cuff Deflation Trial: 2 mins Behavior: Alert;Cooperative;Expresses self well;Good eye contact   Functional Assessment Tool Used: Skilled clinical judgment.   Functional Limitations:  Voice Voice Current Status [phone]): At least 1 percent but less than 20 percent impaired, limited or restricted Voice Goal Status [id number]): At least 1 percent but less than 20 percent impaired, limited or restricted      Lamar Sprinkles 12/11/2015, 2:40 PM  Shelly Flatten, [place], [facility] SLP [phone]

## 2015-12-11 NOTE — Progress Notes (Addendum)
17:00 Per Dr Elsworth Soho, Pt will remain off tube feeding overnight - will reassess tomorrow.  18:00 Per order, tube feeding will be resumed.

## 2015-12-11 NOTE — Progress Notes (Signed)
Wound vac was removed due to patient having two loose stools within minutes apart from his rectum. Wet to dry dressing was applied.

## 2015-12-11 NOTE — Progress Notes (Signed)
PULMONARY / CRITICAL CARE MEDICINE   Name: Jason [name] MRN: [id number] DOB: 11/09/1985    ADMISSION DATE:  12/06/2015 CONSULTATION DATE:  12/06/15  REFERRING MD:  EDP  CHIEF COMPLAINT:  Decrease in O2 sats, fever, PNA  SUBJECTIVE: tC at 24 hrs, CT was done physical therapy chest  VITAL SIGNS: BP 99/74 mmHg  Pulse 78  Temp(Src) 99.7 F (37.6 C) (Oral)  Resp 23  Ht 5' 8" (1.727 m)  Wt 57.017 kg (125 lb 11.2 oz)  BMI 19.12 kg/m2  SpO2 98%  HEMODYNAMICS:    VENTILATOR SETTINGS: Vent Mode:  [-] CPAP;PSV FiO2 (%):  [28 %-35 %] 35 % PEEP:  [6 cmH20] 6 cmH20 Pressure Support:  [12 cmH20] 12 cmH20  INTAKE / OUTPUT: I/O last 3 completed shifts: In: 3070.2 [I.V.:450; Other:240; NG/GT:1780.2; IV [id number]:349] Out: 1791 [Urine:1750; Stool:900]   PHYSICAL EXAMINATION: General: Chronically ill appearing young male on vent, awake Neuro: Awake, communicative  HEENT: trach site clean PERRL Cardiovascular: RRR, no M, distant Lungs: CTA anterior Abdomen: PEG in place, BS x 4, soft, NT/ND.  ostomy Musculoskeletal: Muscle wasting bilaterally  Skin: Intact, warm, no rashes.  LABS:  BMET  Recent Labs Lab 12/07/15 0319 12/08/15 0400 12/09/15 0345  NA 132* 132* 137  K 5.0 4.5 4.1  CL 101 109 106  CO2 19* 15* 21*  BUN 29* 36* 30*  CREATININE 1.14 1.17 0.74  GLUCOSE 254* 233* 214*    Electrolytes  Recent Labs Lab 12/07/15 0319 12/08/15 0400 12/09/15 0345  CALCIUM 12.8* 12.0* 12.1*  MG 1.7  --   --   PHOS 3.5  --   --     CBC  Recent Labs Lab 12/07/15 0319 12/08/15 0400 12/09/15 0345  WBC 23.6* 24.7* 24.9*  HGB 7.4* 6.8* 7.7*  HCT 24.6* 21.8* 25.4*  PLT 717* 666* 678*    Coag's No results for input(s): APTT, INR in the last 168 hours.  Sepsis Markers  Recent Labs Lab 12/06/15 2027 12/06/15 2254 12/07/15 0318 12/07/15 0319 12/08/15 0400  LATICACIDVEN 1.42 0.68  --  1.0  --   PROCALCITON  --   --  6.53  --  7.36    ABG  Recent  Labs Lab 12/07/15 0545  PHART 7.369  PCO2ART 30.5*  PO2ART 61.0*    Liver Enzymes  Recent Labs Lab 12/06/15 2008  AST 15  ALT 10*  ALKPHOS 102  BILITOT 0.4  ALBUMIN 2.6*    Cardiac Enzymes No results for input(s): TROPONINI, PROBNP in the last 168 hours.  Glucose  Recent Labs Lab 12/10/15 0750 12/10/15 1232 12/10/15 1742 12/10/15 2122 12/11/15 0106 12/11/15 0347  GLUCAP 170* 264* 93 189* 172* 151*    Imaging Ct Abdomen Pelvis Wo Contrast  12/10/2015  CLINICAL DATA:  Paralyzed from accident. Rule out fistula from bladder to ostomy. Severe case of MRSA. Decubitus ulcers to back and buttocks. Oral contrast only due to inadequate IV access. EXAM: CT ABDOMEN AND PELVIS WITHOUT CONTRAST TECHNIQUE: Multidetector CT imaging of the abdomen and pelvis was performed following the standard protocol without IV contrast. COMPARISON:  None. FINDINGS: Lower chest: There are bibasilar consolidations which could represent atelectasis or pneumonia. There are also small bilateral pleural effusions. Hepatobiliary: No mass visualized on this un-enhanced exam. Pancreas: No mass or inflammatory process identified on this un-enhanced exam. Spleen: Within normal limits in size. Adrenals/Urinary Tract: Bladder walls appear thickened. Suprapubic catheter in place without obvious complicating feature. Kidneys are unremarkable without stone or hydronephrosis. Stomach/Bowel: Gastrostomy  tube in place. Soft tissue defect of the left lower abdominal wall, presumed colostomy. No large or small bowel dilatation. Vascular/Lymphatic: No pathologically enlarged lymph nodes. No evidence of abdominal aortic aneurysm. IVC filter in place, well-positioned just below the level of the renal veins. Reproductive: No mass or other significant abnormality. Other: Small amount of free fluid within the right paracolic gutter and left lower quadrant. No abscess collection identified within the abdomen or pelvis. No free  intraperitoneal air. No obvious fistula between the bladder and left lower abdominal ostomy site. Musculoskeletal: Ill-defined edema throughout the subcutaneous soft tissues suggesting anasarca. Soft tissue defects within the bilateral buttocks region, compatible with the given history of decubitus ulcers. Additional large soft tissue defect overlying the sacrum, again compatible with the given history of decubitus ulcer. Underlying the decubitus ulcer sites, there are fairly well-defined cortical defects within the posterior right iliac bone and posterior sacrum. IMPRESSION: 1. Circumferential thickening of the walls of the bladder suspicious for cystitis. Recommend correlation with urinalysis. Suprapubic catheter in place. 2. Left lower abdominal wall defect, presumed colostomy. No fistulous connection identified between the bladder and this presumed colostomy. If clinical concern for fistula persists, could consider CT cystogram by injecting contrast into the bladder through the indwelling suprapubic catheter. 3. Small amount of ascites. No intra-abdominal or intrapelvic abscess collection seen. No free intraperitoneal air. 4. Decubitus ulcers of the bilateral buttocks regions and overlying the sacrum. 5. Cortical defects along the posterior margins of the right iliac bone and posterior sacrum, somewhat well-defined, immediately underlying the sites of the decubitus ulcers. I suspect that these are chronic osseous erosions related to the overlying decubitus ulcers. Alternatively, these could be postsurgical changes performed in conjunction with decubitus ulcer debridements if surgical history correlates. Destructive changes of osteomyelitis is certainly an additional possibility. MRI may be helpful for further characterization. 6. Bibasilar consolidations, atelectasis versus pneumonia, with small adjacent pleural effusions. These results will be called to the ordering clinician or representative by the  Radiologist Assistant, and communication documented in the PACS or zVision Dashboard. Electronically Signed   By: Jason  [name] M.D.   On: 12/10/2015 18:50     STUDIES:  CXR 02/28 > bibasilar airspace densities concerning for PNA. CT abdo/ pelvis>>>no fistula  CULTURES: Blood 02/28 >> Urine 02/28 >> Sputum 02/28 >>   MRSA pos screen  ANTIBIOTICS: Vanc 02/28 >>  Levaquin 02/28 >>  SIGNIFICANT EVENTS: 2/28  Admitted with probable HCAP 3/2- no fevers, low secretions, remains vented, refusing Chest PT and other treatments 3/3 improved to TC, pcxr better 3/5- trach collar 24  hrs  LINES/TUBES: Chronic trach / PEG >>   ASSESSMENT / PLAN:  PULMONARY A: 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 HCAP P:   Maintain ABX regimen, see ID TC goal met at 24 hrs, avoid vent as able PMV push all day, never at night Chest pt  Mobilize as able  CARDIOVASCULAR A:  Chronic hypotension - on midodrine. P:  Midodrine tele  RENAL A:   Hyponatremia - chronic NONAG acidosis resolving 3/4 P:   bmet in am   GASTROINTESTINAL A:   GERD, gastroparesis Nutrition R/o chronic fistula - NEG CT 3/4 P:   Pantoprazole TF's to goal, need continuous to avoid hypoglycemia CT reviewed, no fistula noted, with output, may need surgery to assess in am   HEMATOLOGIC A:   Anemia - chronic, some dilution now Thrombocytosis - chronic VTE Prophylaxis P:  Transfuse for Hgb < 7 SCD's / heparin   Limit phlebotomy Get wbc in am   INFECTIOUS A:   UTI - hx pseudomonal UTI in past with resistance to multiple abx Probable HCAP vs tracheitis Hx sacral wounds Some PCT rise about the same clinically P:   Abx as above (vanc / levaquin), ID noted 2 more days  ENDOCRINE A:   DM Hypoglycemia resolved with continuous TF 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 Titus Mould, MD, [place] Pgr: [facility]

## 2015-12-11 NOTE — Progress Notes (Signed)
Received patient with tube feeding off since 0300am. Per night nurse pt continues to have stool from rectum, wound vac not currently in place. Per telephone call 08:30. Dr Titus Mould states pt will have colostomy patency checked by surgery team. RN contacted by [facility]) for update on why pt's tube feeding was stopped.

## 2015-12-12 ENCOUNTER — Encounter (HOSPITAL_COMMUNITY): Payer: Self-pay | Admitting: General Surgery

## 2015-12-12 LAB — GLUCOSE, CAPILLARY
GLUCOSE-CAPILLARY: 109 mg/dL — AB (ref 65–99)
GLUCOSE-CAPILLARY: 308 mg/dL — AB (ref 65–99)
Glucose-Capillary: 377 mg/dL — ABNORMAL HIGH (ref 65–99)
Glucose-Capillary: 345 mg/dL — ABNORMAL HIGH (ref 65–99)
Glucose-Capillary: 273 mg/dL — ABNORMAL HIGH (ref 65–99)
Glucose-Capillary: 157 mg/dL — ABNORMAL HIGH (ref 65–99)

## 2015-12-12 LAB — CULTURE, BLOOD (ROUTINE X 2)
CULTURE: NO GROWTH
CULTURE: NO GROWTH

## 2015-12-12 LAB — BASIC METABOLIC PANEL
Anion gap: 11 (ref 5–15)
BUN: 26 mg/dL — AB (ref 6–20)
CALCIUM: 10.5 mg/dL — AB (ref 8.9–10.3)
CO2: 21 mmol/L — ABNORMAL LOW (ref 22–32)
CREATININE: 1 mg/dL (ref 0.61–1.24)
Chloride: 100 mmol/L — ABNORMAL LOW (ref 101–111)
GFR calc Af Amer: >60 mL/min (ref >60)
GFR calc non Af Amer: >60 mL/min (ref >60)
Glucose, Bld: 290 mg/dL — ABNORMAL HIGH (ref 65–99)
Potassium: 5.3 mmol/L — ABNORMAL HIGH (ref 3.5–5.1)
SODIUM: 132 mmol/L — AB (ref 135–145)

## 2015-12-12 MED ORDER — GLUCERNA 1.2 CAL PO LIQD: 1000.0000 mL | ORAL | Status: DC

## 2015-12-12 MED ORDER — INSULIN ASPART 100 UNIT/ML ~~LOC~~ SOLN
0.0000 [IU] | SUBCUTANEOUS | Status: DC
  Administered 2015-12-12: 2 [IU] via SUBCUTANEOUS
  Administered 2015-12-12: 7 [IU] via SUBCUTANEOUS
  Administered 2015-12-13: 5 [IU] via SUBCUTANEOUS
  Administered 2015-12-13 (×2): 3 [IU] via SUBCUTANEOUS
  Administered 2015-12-13: 5 [IU] via SUBCUTANEOUS
  Administered 2015-12-13: 9 [IU] via SUBCUTANEOUS
  Administered 2015-12-14 (×4): 2 [IU] via SUBCUTANEOUS
  Administered 2015-12-15: 1 [IU] via SUBCUTANEOUS
  Administered 2015-12-15 (×2): 3 [IU] via SUBCUTANEOUS
  Administered 2015-12-16: 2 [IU] via SUBCUTANEOUS
  Administered 2015-12-16 (×2): 1 [IU] via SUBCUTANEOUS
  Administered 2015-12-17: 3 [IU] via SUBCUTANEOUS
  Administered 2015-12-17: 1 [IU] via SUBCUTANEOUS
  Administered 2015-12-17 – 2015-12-18 (×3): 3 [IU] via SUBCUTANEOUS
  Administered 2015-12-19 (×2): 5 [IU] via SUBCUTANEOUS
  Administered 2015-12-20 (×3): 7 [IU] via SUBCUTANEOUS
  Administered 2015-12-20 (×2): 5 [IU] via SUBCUTANEOUS
  Administered 2015-12-20: 2 [IU] via SUBCUTANEOUS
  Administered 2015-12-21: 5 [IU] via SUBCUTANEOUS
  Administered 2015-12-21: 2 [IU] via SUBCUTANEOUS
  Administered 2015-12-21: 3 [IU] via SUBCUTANEOUS
  Administered 2015-12-21: 2 [IU] via SUBCUTANEOUS
  Administered 2015-12-21 – 2015-12-22 (×3): 5 [IU] via SUBCUTANEOUS
  Administered 2015-12-22: 7 [IU] via SUBCUTANEOUS
  Administered 2015-12-22: 2 [IU] via SUBCUTANEOUS
  Administered 2015-12-22: 5 [IU] via SUBCUTANEOUS
  Administered 2015-12-22 (×2): 2 [IU] via SUBCUTANEOUS
  Administered 2015-12-23: 5 [IU] via SUBCUTANEOUS
  Administered 2015-12-23 (×2): 7 [IU] via SUBCUTANEOUS
  Administered 2015-12-23: 3 [IU] via SUBCUTANEOUS
  Administered 2015-12-23 (×2): 9 [IU] via SUBCUTANEOUS
  Administered 2015-12-24: 5 [IU] via SUBCUTANEOUS
  Administered 2015-12-24 (×3): 3 [IU] via SUBCUTANEOUS
  Administered 2015-12-24: 5 [IU] via SUBCUTANEOUS
  Administered 2015-12-24: 7 [IU] via SUBCUTANEOUS
  Administered 2015-12-25: 3 [IU] via SUBCUTANEOUS
  Administered 2015-12-25: 7 [IU] via SUBCUTANEOUS
  Administered 2015-12-25: 3 [IU] via SUBCUTANEOUS
  Administered 2015-12-25: 9 [IU] via SUBCUTANEOUS
  Administered 2015-12-26: 3 [IU] via SUBCUTANEOUS

## 2015-12-12 MED ORDER — IBUPROFEN 200 MG PO TABS
200.0000 mg | ORAL_TABLET | Freq: Four times a day (QID) | ORAL | Status: DC | PRN
  Administered 2015-12-12 (×2): 200 mg via ORAL
  Filled 2015-12-12 (×2): qty 1

## 2015-12-12 MED ORDER — INSULIN ASPART 100 UNIT/ML ~~LOC~~ SOLN
2.0000 [IU] | SUBCUTANEOUS | Status: DC
  Administered 2015-12-12 – 2015-12-15 (×12): 2 [IU] via SUBCUTANEOUS

## 2015-12-12 MED ORDER — GLUCERNA 1.2 CAL PO LIQD
1000.0000 mL | ORAL | Status: AC
  Administered 2015-12-12: 1000 mL
  Filled 2015-12-12 (×2): qty 1000

## 2015-12-12 NOTE — Consult Note (Signed)
Reason for Consult: diverting loop colostomy, fecal incontinence Referring Physician: Dr. Marshell Garfinkel    HPI: Jason [name] is a 30 year old male with a history of type I diabetes mellitus, paraplegia, s/p tracheostomy, gastrostomy tube, diverting loop colostomy by Dr. Georgette Dover January 2016 for multiple decubiti which were debrided by our service and by plastics last being October 2016.  He was admitted on 12/06/15 with shortness of breath.  He was found to have HCAP, UTI.  He also has chronic sacral and ischial wounds x2.   Mother reports that about 3 weeks ago he was noted to have a small bowel movement.  Another one last Tuesday and a large loose one this Sunday per his rectum.  Colostomy is functioning.  He is currently on tube feeds via a gastrostomy tube, also takes in PO.  Denies nausea, vomiting or abdominal pain. VAC was removed on Sunday.    Past Medical History  Diagnosis Date  . GERD (gastroesophageal reflux disease)   . Asthma   . Hx MRSA infection     on face  . Gastroparesis   . Diabetic neuropathy ([place])   . Seizures ([place])   . Family history of anesthesia complication     Pt mother can't have epidural procedures  . Dysrhythmia   . Pneumonia   . Arthritis   . Fibromyalgia   . 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 Type I diabetes mellitus ([place])     sees Dr. Loanne Drilling   . Syncope 02/16/2015  . Multiple allergies 11/02/2015  . Recurrent UTI 11/02/2015    Past Surgical History  Procedure Laterality Date  . Tonsillectomy    . Multiple extractions with alveoloplasty N/A 08/03/2014    Procedure: MULTIPLE EXTRACTIONS;  Surgeon: Gae Bon, DDS;  Location: [place];  Service: Oral Surgery;  Laterality: N/A;  . Tee without cardioversion N/A 08/17/2014    Procedure: TRANSESOPHAGEAL ECHOCARDIOGRAM (TEE);  Surgeon: Dorothy Spark, MD;  Location: [facility] ENDOSCOPY;  Service: Cardiovascular;  Laterality: N/A;  . Debridment of decubitus ulcer N/A 10/04/2014     Procedure: DEBRIDMENT OF DECUBITUS ULCER;  Surgeon: Georganna Skeans, MD;  Location: [place];  Service: General;  Laterality: N/A;  . Laparoscopic diverted colostomy N/A 10/12/2014    Procedure: LAPAROSCOPIC DIVERTING COLOSTOMY;  Surgeon: Donnie Mesa, MD;  Location: [place];  Service: General;  Laterality: N/A;  . Insertion of suprapubic catheter N/A 10/12/2014    Procedure: INSERTION OF SUPRAPUBIC CATHETER;  Surgeon: Reece Packer, MD;  Location: [place];  Service: Urology;  Laterality: N/A;  . Incision and drainage of wound N/A 11/12/2014    Procedure: IRRIGATION AND DEBRIDEMENT OF WOUNDS WITH BONE BIOPSY AND SURGICAL PREP ;  Surgeon: Theodoro Kos, DO;  Location: [place];  Service: Plastics;  Laterality: N/A;  . Application of a-cell of back N/A 11/12/2014    Procedure: APPLICATION A CELL AND VAC ;  Surgeon: Theodoro Kos, DO;  Location: [place];  Service: Plastics;  Laterality: N/A;  . Incision and drainage of wound N/A 11/18/2014    Procedure: IRRIGATION AND DEBRIDEMENT OF SACRAL ULCER ONLY WITH PLACEMENT OF A CELL AND VAC/ DRESSING CHANGE TO UPPER BACK AREA.;  Surgeon: Theodoro Kos, DO;  Location: [place];  Service: Plastics;  Laterality: N/A;  . Incision and drainage of wound N/A 11/25/2014    Procedure: IRRIGATION AND DEBRIDEMENT OF SACRAL ULCER AND BACK BURN WITH PLACEMENT OF A-CELL;  Surgeon: Theodoro Kos, DO;  Location: [facility]  OR;  Service: Clinical cytogeneticist;  Laterality: N/A;  . Minor application of wound vac N/A 11/25/2014    Procedure:  WOUND VAC CHANGE;  Surgeon: Theodoro Kos, DO;  Location: [place];  Service: Plastics;  Laterality: N/A;  . Incision and drainage of wound Right 12/08/2014    Procedure: IRRIGATION AND DEBRIDEMENT SACRAL WOUND AND RIGHT ISCHIAL WOUND ;  Surgeon: Theodoro Kos, DO;  Location: [place];  Service: Plastics;  Laterality: Right;  . Application of a-cell of back N/A 12/08/2014    Procedure: APPLICATION OF A-CELL AND WOUND VAC ;  Surgeon: Theodoro Kos, DO;  Location: [place];  Service:  Plastics;  Laterality: N/A;  . Tee without cardioversion N/A 05/05/2015    Procedure: TRANSESOPHAGEAL ECHOCARDIOGRAM (TEE);  Surgeon: Lelon Perla, MD;  Location: [place];  Service: Cardiovascular;  Laterality: N/A;  . Incision and drainage of wound Bilateral 05/05/2015    Procedure: IRRIGATION AND DEBRIDEMENT SACRAL ULCER;  Surgeon: Theodoro Kos, DO;  Location: [place];  Service: Plastics;  Laterality: Bilateral;  . Hematoma evacuation N/A 05/05/2015    Procedure: EVACUATION HEMATOMA bedside procedure;  Surgeon: Theodoro Kos, DO;  Location: [place];  Service: Plastics;  Laterality: N/A;  . Incision and drainage of wound N/A 08/04/2015    Procedure: IRRIGATION AND DEBRIDEMENT OF SACRAL ULCER AND ;  Surgeon: Loel Lofty Dillingham, DO;  Location: [place];  Service: Plastics;  Laterality: N/A;  . Application of a-cell of chest/abdomen N/A 08/04/2015    Procedure: APPLICATION OF A-CELL ;  Surgeon: Loel Lofty Dillingham, DO;  Location: [place];  Service: Plastics;  Laterality: N/A;    Family History  Problem Relation Age of Onset  . Diabetes Father   . Hypertension Father   . Asthma      fhx  . Hypertension      fhx  . Stroke      fhx  . Heart disease Mother     Social History:  reports that he quit smoking about 15 months ago. His smoking use included Cigars and Cigarettes. He has a 12 pack-year smoking history. He has never used smokeless tobacco. He reports that he does not drink alcohol or use illicit drugs.  Allergies:  Allergies  Allergen Reactions  . Cefuroxime Axetil Anaphylaxis  . Ertapenem Other (See Comments)    Rash and confusion-->tolerated Imipenem   . Morphine And Related Other (See Comments)    Changed mental status, confusion, headache, visual hallucination  . Penicillins Anaphylaxis and Other (See Comments)    Tolerated Imipenem; no reaction to 7 day course of amoxicillin in 2015 Has patient had a PCN reaction causing immediate rash, facial/tongue/throat swelling, SOB  or lightheadedness with hypotension: Yes Has patient had a PCN reaction causing severe rash involving mucus membranes or skin necrosis: No Has patient had a PCN reaction that required hospitalization Yes Has patient had a PCN reaction occurring within the last 10 years: No If all of the above answers are "NO", then may proceed with Cephalo  . Sulfa Antibiotics Anaphylaxis, Shortness Of Breath and Other (See Comments)  . Tessalon [Benzonatate] Anaphylaxis  . Shellfish Allergy Itching and Other (See Comments)    Took benadryl to alleviate reaction  . Levaquin [Levofloxacin In D5w] Swelling    Hand and forearm swelling  . Nsaids Other (See Comments)    Risk of bleeding  . Miripirium Rash and Other (See Comments)    Change in mental status    Medications:  Scheduled Meds: . antiseptic oral rinse  7 mL Mouth Rinse QID  . baclofen  20 mg Oral Q6H  . buPROPion  150 mg Oral Daily  . chlorhexidine gluconate  15 mL Mouth Rinse BID  . fentaNYL  50 mcg Transdermal Q72H  . heparin  5,000 Units Subcutaneous 3 times per day  . insulin aspart  0-9 Units Subcutaneous 6 times per day  . insulin aspart  2 Units Subcutaneous 6 times per day  . levofloxacin (LEVAQUIN) IV  750 mg Intravenous Q24H  . midodrine  20 mg Per Tube TID WC  . pantoprazole (PROTONIX) IV  40 mg Intravenous QHS  . pregabalin  100 mg Per Tube TID  . vancomycin  500 mg Intravenous Q12H   Continuous Infusions:  PRN Meds:.sodium chloride, albuterol, fentaNYL (SUBLIMAZE) injection, ibuprofen, LORazepam   Results for orders placed or performed during the hospital encounter of 12/06/15 (from the past 48 hour(s))  Glucose, capillary     Status: None   Collection Time: 12/10/15  5:42 PM  Result Value Ref Range   Glucose-Capillary 93 65 - 99 mg/dL  Glucose, capillary     Status: Abnormal   Collection Time: 12/10/15  9:22 PM  Result Value Ref Range   Glucose-Capillary 189 (H) 65 - 99 mg/dL  Glucose, capillary     Status: Abnormal    Collection Time: 12/11/15  1:06 AM  Result Value Ref Range   Glucose-Capillary 172 (H) 65 - 99 mg/dL  Glucose, capillary     Status: Abnormal   Collection Time: 12/11/15  3:47 AM  Result Value Ref Range   Glucose-Capillary 151 (H) 65 - 99 mg/dL  Glucose, capillary     Status: Abnormal   Collection Time: 12/11/15  8:56 AM  Result Value Ref Range   Glucose-Capillary 196 (H) 65 - 99 mg/dL  Glucose, capillary     Status: Abnormal   Collection Time: 12/11/15 12:23 PM  Result Value Ref Range   Glucose-Capillary 302 (H) 65 - 99 mg/dL  Glucose, capillary     Status: Abnormal   Collection Time: 12/11/15  5:21 PM  Result Value Ref Range   Glucose-Capillary 245 (H) 65 - 99 mg/dL  Glucose, capillary     Status: Abnormal   Collection Time: 12/11/15  9:03 PM  Result Value Ref Range   Glucose-Capillary 160 (H) 65 - 99 mg/dL  Glucose, capillary     Status: Abnormal   Collection Time: 12/12/15 12:02 AM  Result Value Ref Range   Glucose-Capillary 109 (H) 65 - 99 mg/dL   Comment 1 Notify RN   Basic metabolic panel     Status: Abnormal   Collection Time: 12/12/15  2:38 AM  Result Value Ref Range   Sodium 132 (L) 135 - 145 mmol/L   Potassium 5.3 (H) 3.5 - 5.1 mmol/L   Chloride 100 (L) 101 - 111 mmol/L   CO2 21 (L) 22 - 32 mmol/L   Glucose, Bld 290 (H) 65 - 99 mg/dL   BUN 26 (H) 6 - 20 mg/dL   Creatinine, Ser 1.00 0.61 - 1.24 mg/dL   Calcium 10.5 (H) 8.9 - 10.3 mg/dL   GFR calc non Af Amer >60 >60 mL/min   GFR calc Af Amer >60 >60 mL/min    Comment: (NOTE) The eGFR has been calculated using the CKD EPI equation. This calculation has not been validated in all clinical situations. eGFR's persistently <60 mL/min signify possible Chronic Kidney Disease.    Anion gap 11 5 - 15  Glucose, capillary  Status: Abnormal   Collection Time: 12/12/15  4:29 AM  Result Value Ref Range   Glucose-Capillary 377 (H) 65 - 99 mg/dL  Glucose, capillary     Status: Abnormal   Collection Time: 12/12/15   7:54 AM  Result Value Ref Range   Glucose-Capillary 345 (H) 65 - 99 mg/dL  Glucose, capillary     Status: Abnormal   Collection Time: 12/12/15 12:17 PM  Result Value Ref Range   Glucose-Capillary 273 (H) 65 - 99 mg/dL    Ct Abdomen Pelvis Wo Contrast  12/10/2015  CLINICAL DATA:  Paralyzed from accident. Rule out fistula from bladder to ostomy. Severe case of MRSA. Decubitus ulcers to back and buttocks. Oral contrast only due to inadequate IV access. EXAM: CT ABDOMEN AND PELVIS WITHOUT CONTRAST TECHNIQUE: Multidetector CT imaging of the abdomen and pelvis was performed following the standard protocol without IV contrast. COMPARISON:  None. FINDINGS: Lower chest: There are bibasilar consolidations which could represent atelectasis or pneumonia. There are also small bilateral pleural effusions. Hepatobiliary: No mass visualized on this un-enhanced exam. Pancreas: No mass or inflammatory process identified on this un-enhanced exam. Spleen: Within normal limits in size. Adrenals/Urinary Tract: Bladder walls appear thickened. Suprapubic catheter in place without obvious complicating feature. Kidneys are unremarkable without stone or hydronephrosis. Stomach/Bowel: Gastrostomy tube in place. Soft tissue defect of the left lower abdominal wall, presumed colostomy. No large or small bowel dilatation. Vascular/Lymphatic: No pathologically enlarged lymph nodes. No evidence of abdominal aortic aneurysm. IVC filter in place, well-positioned just below the level of the renal veins. Reproductive: No mass or other significant abnormality. Other: Small amount of free fluid within the right paracolic gutter and left lower quadrant. No abscess collection identified within the abdomen or pelvis. No free intraperitoneal air. No obvious fistula between the bladder and left lower abdominal ostomy site. Musculoskeletal: Ill-defined edema throughout the subcutaneous soft tissues suggesting anasarca. Soft tissue defects within the  bilateral buttocks region, compatible with the given history of decubitus ulcers. Additional large soft tissue defect overlying the sacrum, again compatible with the given history of decubitus ulcer. Underlying the decubitus ulcer sites, there are fairly well-defined cortical defects within the posterior right iliac bone and posterior sacrum. IMPRESSION: 1. Circumferential thickening of the walls of the bladder suspicious for cystitis. Recommend correlation with urinalysis. Suprapubic catheter in place. 2. Left lower abdominal wall defect, presumed colostomy. No fistulous connection identified between the bladder and this presumed colostomy. If clinical concern for fistula persists, could consider CT cystogram by injecting contrast into the bladder through the indwelling suprapubic catheter. 3. Small amount of ascites. No intra-abdominal or intrapelvic abscess collection seen. No free intraperitoneal air. 4. Decubitus ulcers of the bilateral buttocks regions and overlying the sacrum. 5. Cortical defects along the posterior margins of the right iliac bone and posterior sacrum, somewhat well-defined, immediately underlying the sites of the decubitus ulcers. I suspect that these are chronic osseous erosions related to the overlying decubitus ulcers. Alternatively, these could be postsurgical changes performed in conjunction with decubitus ulcer debridements if surgical history correlates. Destructive changes of osteomyelitis is certainly an additional possibility. MRI may be helpful for further characterization. 6. Bibasilar consolidations, atelectasis versus pneumonia, with small adjacent pleural effusions. These results will be called to the ordering clinician or representative by the Radiologist Assistant, and communication documented in the PACS or zVision Dashboard. Electronically Signed   By: Franki Cabot M.D.   On: 12/10/2015 18:50    Review of Systems  Constitutional: Negative for  fever, chills and  malaise/fatigue.  Respiratory: Negative for cough, hemoptysis, sputum production, shortness of breath and wheezing.   Cardiovascular: Negative for chest pain, palpitations, orthopnea and claudication.  Gastrointestinal: Negative for heartburn, nausea, vomiting, abdominal pain, constipation, blood in stool and melena.  Neurological: Negative for dizziness, tingling, tremors, sensory change, speech change, focal weakness, seizures and loss of consciousness.   Blood pressure 139/80, pulse 96, temperature 100.8 F (38.2 C), temperature source Oral, resp. rate 20, height 5' 8"  (1.727 m), weight 56.926 kg (125 lb 8 oz), SpO2 98 %. Physical Exam  Constitutional: He appears well-developed. He appears cachectic. No distress.  Cardiovascular: Normal rate, regular rhythm and intact distal pulses.  Exam reveals no gallop and no friction rub.   No murmur heard. Respiratory: Effort normal and breath sounds normal. No respiratory distress. He has no wheezes. He has no rales. He exhibits no tenderness.  GI: Soft. Bowel sounds are normal. He exhibits no distension and no mass. There is no tenderness. There is no rebound and no guarding.  LUQ colostomy is pink and viable, stool is present.  Skin: He is not diaphoretic.  Bilateral ischial wound and a sacral decub wound are clean.  About 1-2 inches away from the rectum.     Assessment/Plan: 30 year old male paraplegia, type I DM, tracheostomy and pressure ulcers x2 to ischium and x1 sacrum which are clean.  Admitted with hypoxia, suspected HCAP and UTI.  Found to have stool output per rectum interfering with wound care and VAC therapy.   S/p diverting loop colostomy 1/16 Dr. Adelfa Koh diverting loop was done because the patient wants it reversed once the wounds are healed.  He adamantly refuses an end colostomy.  It is normal to have some stool with a loop via the rectum. If the patient or primary service request an end colostomy, then we will proceed with a  gastrografin enema to further evaluate the anatomy and rule out a fistula.  I suspect this wound be normal.  If the desire is to forego above, then I would recommend bulkening up his stool so that VAC can be replaced.  We discussed benefits of an end colostomy, however, he did not wish to further discuss this option.  Thank you for the consult.  Will continue to follow along.    Sophia Sperry ANP-BC 12/12/2015, 3:22 PM

## 2015-12-12 NOTE — Consult Note (Signed)
WOC follow-up: Pt had stooling out of his rectum during the weekend, which impaired the Vac seal and it had to be discontinued.  Pack bilat ischium and sacrum wounds Q day with moist fluffed gauze and cover with ABD pads until pt is no longer stooling and VAC can be re-applied. WOC will re-assess tomorrow.  CT has been performed and mother states surgical team will provide further input after the results are available as to why this is occurring 1 year after the colostomy surgery was performed. Julien Girt MSN, RN, [place], [place], [place]

## 2015-12-12 NOTE — Progress Notes (Signed)
PULMONARY / CRITICAL CARE MEDICINE   Name: Jason [name] MRN: [id number] DOB: [birth year]    ADMISSION DATE:  12/06/2015 CONSULTATION DATE:  12/06/15  REFERRING MD:  EDP  CHIEF COMPLAINT:  Decrease in O2 sats, fever, PNA  SUBJECTIVE:  No issues overnight, continues to have low grade fevers  VITAL SIGNS: BP 139/80 mmHg  Pulse 96  Temp(Src) 100.8 F (38.2 C) (Oral)  Resp 20  Ht 5\' 8"  (1.727 m)  Wt 125 lb 8 oz (56.926 kg)  BMI 19.09 kg/m2  SpO2 98%  HEMODYNAMICS:    VENTILATOR SETTINGS: Vent Mode:  [-]  FiO2 (%):  [28 %-35 %] 35 %  INTAKE / OUTPUT: I/O last 3 completed shifts: In: [id number] [P.O.:2200; I.V.:749.8; NG/GT:1317.2; IV Piggyback:600] Out: [id number] [Urine:1000; Stool:675]   PHYSICAL EXAMINATION: General: Chronically ill appearing, awake, interactive Neuro: Awake, communicative  HEENT: PERRL, Clean trach site. Cardiovascular: RRR, no MRG Lungs: Clear, no wheeze or crackles. Abdomen: PEG in place, + BS, ostomy Musculoskeletal: Muscle wasting bilaterally  Skin: No rashes.  LABS:  BMET  Recent Labs Lab 12/08/15 0400 12/09/15 0345 12/12/15 0238  NA 132* 137 132*  K 4.5 4.1 5.3*  CL 109 106 100*  CO2 15* 21* 21*  BUN 36* 30* 26*  CREATININE 1.17 0.74 1.00  GLUCOSE 233* 214* 290*    Electrolytes  Recent Labs Lab 12/07/15 0319 12/08/15 0400 12/09/15 0345 12/12/15 0238  CALCIUM 12.8* 12.0* 12.1* 10.5*  MG 1.7  --   --   --   PHOS 3.5  --   --   --     CBC  Recent Labs Lab 12/07/15 0319 12/08/15 0400 12/09/15 0345  WBC 23.6* 24.7* 24.9*  HGB 7.4* 6.8* 7.7*  HCT 24.6* 21.8* 25.4*  PLT 717* 666* 678*    Coag's No results for input(s): APTT, INR in the last 168 hours.  Sepsis Markers  Recent Labs Lab 12/06/15 2027 12/06/15 2254 12/07/15 0318 12/07/15 0319 12/08/15 0400  LATICACIDVEN 1.42 0.68  --  1.0  --   PROCALCITON  --   --  6.53  --  7.36    ABG  Recent Labs Lab 12/07/15 0545  PHART 7.369  PCO2ART 30.5*   PO2ART 61.0*    Liver Enzymes  Recent Labs Lab 12/06/15 2008  AST 15  ALT 10*  ALKPHOS 102  BILITOT 0.4  ALBUMIN 2.6*    Cardiac Enzymes No results for input(s): TROPONINI, PROBNP in the last 168 hours.  Glucose  Recent Labs Lab 12/11/15 1721 12/11/15 2103 12/12/15 0002 12/12/15 0429 12/12/15 0754 12/12/15 1217  GLUCAP 245* 160* 109* 377* 345* 273*    Imaging No results found.   STUDIES:  CXR 02/28 > bibasilar airspace densities concerning for PNA. CT abdo/ pelvis>>>no fistula  CULTURES: Blood 02/28 >> Urine 02/28 >> Sputum 02/28 >>   MRSA pos screen  ANTIBIOTICS: Vanc 02/28 >>  Levaquin 02/28 >>  SIGNIFICANT EVENTS: 2/28  Admitted with probable HCAP 3/2- no fevers, low secretions, remains vented, refusing Chest PT and other treatments 3/3 improved to TC, pcxr better 3/5- trach collar 24  hrs  LINES/TUBES: Chronic trach / PEG >>   ASSESSMENT / PLAN:  PULMONARY A: 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 HCAP P:   Continue abx Tolerating trach collar.  PMV trials Chest PT Mobilize.  CARDIOVASCULAR A:  Chronic hypotension - on midodrine. P:  Continue midodrine   RENAL A:   Hyponatremia - chronic NONAG acidosis resolving 3/4 P:  Stable  GASTROINTESTINAL A:   GERD, gastroparesis Nutrition R/o chronic fistula - NEG CT 3/4 P:   Pantoprazole TF's at goal Surgery called to evaluate  HEMATOLOGIC A:   Anemia - chronic, some dilution now Thrombocytosis - chronic VTE Prophylaxis P:  Transfuse for Hgb < 7 SCD's / heparin  INFECTIOUS A:   UTI - hx pseudomonal UTI in past with resistance to multiple abx Probable HCAP vs tracheitis Hx sacral wounds Some PCT rise about the same clinically P:   Abx as above (vanc / levaquin), ID noted 57more day. Will stop after tomorrow.  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 Consider starting lantus 8 units daily Low Cortisol noted- but no need for additional steroids now.   NEUROLOGIC A:   Hx seizures (not on meds), paraplegia, fibromyalgia, diabetic neuropathy P:   Fentanyl PRN pain Home bupropion, lorazepam, & lyrica   Family updated: Mother at bedside 3/1 updated, pt updated 3/2, 3/3, 3/4, 3/5. 3/6 Interdisciplinary Family Meeting v Palliative Care Meeting:  Due by: 12/12/15  Marshell Garfinkel MD [facility] Pager [phone] If no answer or after 3pm call: [phone] 12/12/2015, 2:51 PM

## 2015-12-12 NOTE — Progress Notes (Signed)
Patient ID: Jason [name], male   DOB: October 06, 1986, 30 y.o.   MRN: [id number]         [facility]    Date of Admission:  12/06/2015   Total days of antibiotics 6         Principal Problem:   HCAP (healthcare-associated pneumonia) Active Problems:   Acute on chronic respiratory failure ([name])   Recurrent UTI   Pressure ulcer   Neurogenic bladder   Respiratory failure, chronic ([name])   Paraplegia ([name])   Acute respiratory failure ([name])   Pneumonia   Fistula   . antiseptic oral rinse  7 mL Mouth Rinse QID  . baclofen  20 mg Oral Q6H  . buPROPion  150 mg Oral Daily  . chlorhexidine gluconate  15 mL Mouth Rinse BID  . fentaNYL  50 mcg Transdermal Q72H  . heparin  5,000 Units Subcutaneous 3 times per day  . insulin aspart  0-20 Units Subcutaneous 6 times per day  . levofloxacin (LEVAQUIN) IV  750 mg Intravenous Q24H  . midodrine  20 mg Per Tube TID WC  . pantoprazole (PROTONIX) IV  40 mg Intravenous QHS  . pregabalin  100 mg Per Tube TID  . vancomycin  500 mg Intravenous Q12H    SUBJECTIVE: He is feeling better but currently quite hot (recent temperature of 102.4). He denies cough. He does have some discomfort on the dorsum of both hands where his IVs have infiltrated recently.  Review of Systems: Review of Systems  Constitutional: Positive for fever. Negative for chills, weight loss, malaise/fatigue and diaphoresis.  HENT: Negative for sore throat.   Respiratory: Negative for cough, sputum production and shortness of breath.   Cardiovascular: Negative for chest pain.  Gastrointestinal: Negative for nausea, vomiting and diarrhea.  Musculoskeletal: Negative for myalgias and joint pain.  Skin: Negative for rash.  Neurological: Positive for focal weakness. Negative for headaches.    Past Medical History  Diagnosis Date  . GERD (gastroesophageal reflux disease)   . Asthma   . Hx MRSA infection     on face  . Gastroparesis   . Diabetic  neuropathy ([place])   . Seizures ([place])   . Family history of anesthesia complication     Pt mother can't have epidural procedures  . Dysrhythmia   . Pneumonia   . Arthritis   . Fibromyalgia   . 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 Type I diabetes mellitus ([place])     sees Dr. Loanne Drilling   . Syncope 02/16/2015  . Multiple allergies 11/02/2015  . Recurrent UTI 11/02/2015    Social History  Substance Use Topics  . Smoking status: Former Smoker -- 1.00 packs/day for 12 years    Types: Cigars, Cigarettes    Quit date: 09/07/2014  . Smokeless tobacco: Never Used  . Alcohol Use: No    Family History  Problem Relation Age of Onset  . Diabetes Father   . Hypertension Father   . Asthma      fhx  . Hypertension      fhx  . Stroke      fhx  . Heart disease Mother    Allergies  Allergen Reactions  . Cefuroxime Axetil Anaphylaxis  . Ertapenem Other (See Comments)    Rash and confusion-->tolerated Imipenem   . Morphine And Related Other (See Comments)    Changed mental status, confusion, headache, visual hallucination  . Penicillins Anaphylaxis and Other (See  Comments)    Tolerated Imipenem; no reaction to 7 day course of amoxicillin in 2015 Has patient had a PCN reaction causing immediate rash, facial/tongue/throat swelling, SOB or lightheadedness with hypotension: Yes Has patient had a PCN reaction causing severe rash involving mucus membranes or skin necrosis: No Has patient had a PCN reaction that required hospitalization Yes Has patient had a PCN reaction occurring within the last 10 years: No If all of the above answers are "NO", then may proceed with Cephalo  . Sulfa Antibiotics Anaphylaxis, Shortness Of Breath and Other (See Comments)  . Tessalon [Benzonatate] Anaphylaxis  . Shellfish Allergy Itching and Other (See Comments)    Took benadryl to alleviate reaction  . Levaquin [Levofloxacin In D5w] Swelling    Hand and forearm swelling  . Nsaids  Other (See Comments)    Risk of bleeding  . Miripirium Rash and Other (See Comments)    Change in mental status    OBJECTIVE: Filed Vitals:   12/12/15 0600 12/12/15 0743 12/12/15 0757 12/12/15 0900  BP: 117/73  117/73   Pulse: 110  111   Temp:   102.4 F (39.1 C) 102.4 F (39.1 C)  TempSrc:   Oral Oral  Resp: 31  24   Height:      Weight:      SpO2: 100% 100% 100%    Body mass index is 19.09 kg/(m^2).  Physical Exam  Constitutional: He is oriented to person, place, and time.  He is alert and talkative. No distress. He is resting quietly in bed on trach collar oxygen.  HENT:  Mouth/Throat: No oropharyngeal exudate.  Eyes: Conjunctivae are normal.  Cardiovascular: Normal rate and regular rhythm.   No murmur heard. Pulmonary/Chest: Breath sounds normal.  Abdominal: Soft.  Musculoskeletal:  He does have swelling on the dorsum of both hands without overlying cellulitis or fluctuance.  Neurological: He is alert and oriented to person, place, and time.  Skin: No rash noted.  Psychiatric: Mood and affect normal.    Lab Results Lab Results  Component Value Date   WBC 24.9* 12/09/2015   HGB 7.7* 12/09/2015   HCT 25.4* 12/09/2015   MCV 78.4 12/09/2015   PLT 678* 12/09/2015    Lab Results  Component Value Date   CREATININE 1.00 12/12/2015   BUN 26* 12/12/2015   NA 132* 12/12/2015   K 5.3* 12/12/2015   CL 100* 12/12/2015   CO2 21* 12/12/2015    Lab Results  Component Value Date   ALT 10* 12/06/2015   AST 15 12/06/2015   ALKPHOS 102 12/06/2015   BILITOT 0.4 12/06/2015     Microbiology: Recent Results (from the past 240 hour(s))  Culture, blood (routine x 2)     Status: None   Collection Time: 12/06/15  8:07 PM  Result Value Ref Range Status   Specimen Description BLOOD RIGHT ANTECUBITAL  Final   Special Requests BOTTLES DRAWN AEROBIC AND ANAEROBIC 5CC  Final   Culture NO GROWTH 5 DAYS  Final   Report Status 12/11/2015 FINAL  Final  Culture, blood (routine x  2)     Status: None   Collection Time: 12/06/15  8:16 PM  Result Value Ref Range Status   Specimen Description BLOOD LEFT HAND  Final   Special Requests IN PEDIATRIC BOTTLE 4CC  Final   Culture NO GROWTH 5 DAYS  Final   Report Status 12/11/2015 FINAL  Final  Urine culture     Status: None   Collection Time: 12/06/15 11:03  PM  Result Value Ref Range Status   Specimen Description URINE, CATHETERIZED  Final   Special Requests NONE  Final   Culture MULTIPLE SPECIES PRESENT, SUGGEST RECOLLECTION  Final   Report Status 12/08/2015 FINAL  Final  Culture, blood (Routine X 2) w Reflex to ID Panel     Status: None (Preliminary result)   Collection Time: 12/07/15  3:07 AM  Result Value Ref Range Status   Specimen Description BLOOD RIGHT ARM  Final   Special Requests BOTTLES DRAWN AEROBIC AND ANAEROBIC 5ML  Final   Culture NO GROWTH 4 DAYS  Final   Report Status PENDING  Incomplete  Culture, blood (Routine X 2) w Reflex to ID Panel     Status: None (Preliminary result)   Collection Time: 12/07/15  3:15 AM  Result Value Ref Range Status   Specimen Description BLOOD RIGHT HAND  Final   Special Requests IN PEDIATRIC BOTTLE 4ML  Final   Culture NO GROWTH 4 DAYS  Final   Report Status PENDING  Incomplete  MRSA PCR Screening     Status: Abnormal   Collection Time: 12/07/15  6:30 PM  Result Value Ref Range Status   MRSA by PCR POSITIVE (A) NEGATIVE Final    Comment:        The GeneXpert MRSA Assay (FDA approved for NASAL specimens only), is one component of a comprehensive MRSA colonization surveillance program. It is not intended to diagnose MRSA infection nor to guide or monitor treatment for MRSA infections. RESULT CALLED TO, READ BACK BY AND VERIFIED WITH: K.GARCIA,RN @2131  BY L.PITT 12/07/15      ASSESSMENT: The cause of his recurrent fevers is not entirely clear but I am concerned about IV associated phlebitis. I will continue current antibiotics for 1 more day and repeat blood  cultures now.  PLAN: 1. Continue current antibiotics 2. Blood cultures 2  Michel Bickers, [facility] [place] [phone] pager   [phone] cell 12/12/2015, 11:04 AM

## 2015-12-12 NOTE — Progress Notes (Signed)
Received pt with temperature 102.4. Evidence of patient drinking thin fluids overnight. Ice packs in place,strict NPO and will monitor situation.

## 2015-12-12 NOTE — Progress Notes (Signed)
Pharmacy Antibiotic Note  HPI: Jason [name] is a 30 y.o. male admitted on 12/06/2015 with pneumonia. He has a history of multiple antibiotic allergies/intolerances and is well known to the ID service given numerous admissions for infections with multidrug resistant pathogens.   He has significant healthcare and antibiotic exposure, most recently with:  -Fosfomycin for UTI in December 2016 -4 weeks of empiric Cipro/Flagyl for cdiff in December 2016 -doxycycline in January 2017 for tracheitis -10 day course of Cipro starting 11/07/15 for klebsiella UTI   He also had a lengthy hospital stay in Summer 2016 during which time he grew acinetobacter calcoaceticus/baumannii in 3 separate respiratory cultures (05/08/15, 05/12/15, 05/18/15). Susceptibilities showed RESISTANCE to numerous antibiotics, including fluoroquinolones.   Given Mr. Seher's recent antibiotic exposure and history of MDR pathogens, he remains at high risk for MDR infection with gram-negative rods. His antibiotic allergies/intolerances make his options for coverage limited  Day #6/7 of broad spectrum abx for HCAP. Still spiking fevers up to 102.4 today, WBC elevated at 24.9 last checked on 3/2. ID would consider changing to vancomycin + tigecycline + aztreonam if clinically worsens  Plan: Continue Levaquin 750mg  IV Q24H Continue vancomycin 500mg  IV Q12H   Monitor clinical picture, renal function F/U stop date of 3/7 (ID rec 7 days), repeat blood cx's   Height: 5\' 8"  (172.7 cm) Weight: 125 lb 8 oz (56.926 kg) IBW/kg (Calculated) : 68.4  Temp (24hrs), Avg:100.2 F (37.9 C), Min:98.9 F (37.2 C), Max:102.4 F (39.1 C)   Recent Labs Lab 12/06/15 2008 12/06/15 2027 12/06/15 2236 12/06/15 2254 12/07/15 0319 12/08/15 0400 12/08/15 2127 12/09/15 0345 12/12/15 0238  WBC  --   --  22.2*  --  23.6* 24.7*  --  24.9*  --   CREATININE 0.94  --   --   --  1.14 1.17  --  0.74 1.00  LATICACIDVEN  --  1.42  --  0.68 1.0  --   --    --   --   VANCOTROUGH  --   --   --   --   --   --  19  --   --     Estimated Creatinine Clearance: 86.1 mL/min (by C-G formula based on Cr of 1).    Allergies  Allergen Reactions  . Cefuroxime Axetil Anaphylaxis  . Ertapenem Other (See Comments)    Rash and confusion-->tolerated Imipenem   . Morphine And Related Other (See Comments)    Changed mental status, confusion, headache, visual hallucination  . Penicillins Anaphylaxis and Other (See Comments)    Tolerated Imipenem; no reaction to 7 day course of amoxicillin in 2015 Has patient had a PCN reaction causing immediate rash, facial/tongue/throat swelling, SOB or lightheadedness with hypotension: Yes Has patient had a PCN reaction causing severe rash involving mucus membranes or skin necrosis: No Has patient had a PCN reaction that required hospitalization Yes Has patient had a PCN reaction occurring within the last 10 years: No If all of the above answers are "NO", then may proceed with Cephalo  . Sulfa Antibiotics Anaphylaxis, Shortness Of Breath and Other (See Comments)  . Tessalon [Benzonatate] Anaphylaxis  . Shellfish Allergy Itching and Other (See Comments)    Took benadryl to alleviate reaction  . Levaquin [Levofloxacin In D5w] Swelling    Hand and forearm swelling  . Nsaids Other (See Comments)    Risk of bleeding  . Miripirium Rash and Other (See Comments)    Change in mental status  Antimicrobials this admission: Vanc 3/1 >>  Levaquin 3/1 >>  Primaxin x 1 2/28   Dose adjustments this admission: none  Microbiology results: 2/28 UCx >> multiple species present 2/28 BCx >> ngF 3/1 BCx x2 >> ngF 3/1 MRSA PCR >> positive  Thank you for allowing pharmacy to be a part of this patient's care.  Elenor Quinones, PharmD, BCPS Clinical Pharmacist Pager [phone] 12/12/2015 11:14 AM

## 2015-12-12 NOTE — Progress Notes (Signed)
Inpatient Diabetes Program Recommendations  AACE/ADA: New Consensus Statement on Inpatient Glycemic Control (2015)  Target Ranges:  Prepandial:   less than 140 mg/dL      Peak postprandial:   less than 180 mg/dL (1-2 hours)      Critically ill patients:  140 - 180 mg/dL   Review of Glycemic Control:Results for Jason [name], Jason [name] (MRN [id number]) as of 12/12/2015 09:08  Ref. Range 12/11/2015 17:21 12/11/2015 21:03 12/12/2015 00:02 12/12/2015 04:29 12/12/2015 07:54  Glucose-Capillary Latest Ref Range: 65-99 mg/dL 245 (H) 160 (H) 109 (H) 377 (H) 345 (H)     Diabetes history: Type 1 diabetes Outpatient Diabetes medications: Lantus 15 units daily, Novolog flex pen 4 times daily  Current orders for Inpatient glycemic control:  Novolog resistant q 4 hours  Inpatient Diabetes Program Recommendations:  Please reduce Novolog correction to sensitive q 4 hours.  Also consider adding Novolog tube feed coverage 2 units q 4 hours- To be held if tube feeds held for any reason.   Consider restarting a portion of patient's Lantus.  Consider Lantus 8 units daily.   Thanks, Adah Perl, RN, BC-ADM Inpatient Diabetes Coordinator Pager [phone] (8a-5p)

## 2015-12-13 ENCOUNTER — Inpatient Hospital Stay (HOSPITAL_COMMUNITY): Payer: Medicaid Other

## 2015-12-13 ENCOUNTER — Other Ambulatory Visit: Payer: Self-pay | Admitting: Endocrinology

## 2015-12-13 ENCOUNTER — Other Ambulatory Visit: Payer: Self-pay | Admitting: Physical Medicine & Rehabilitation

## 2015-12-13 ENCOUNTER — Other Ambulatory Visit: Payer: Self-pay | Admitting: Infectious Disease

## 2015-12-13 LAB — GLUCOSE, CAPILLARY
GLUCOSE-CAPILLARY: 221 mg/dL — AB (ref 65–99)
GLUCOSE-CAPILLARY: 275 mg/dL — AB (ref 65–99)
GLUCOSE-CAPILLARY: 354 mg/dL — AB (ref 65–99)
Glucose-Capillary: 272 mg/dL — ABNORMAL HIGH (ref 65–99)
Glucose-Capillary: 244 mg/dL — ABNORMAL HIGH (ref 65–99)
Glucose-Capillary: 278 mg/dL — ABNORMAL HIGH (ref 65–99)
Glucose-Capillary: 98 mg/dL (ref 65–99)

## 2015-12-13 LAB — BASIC METABOLIC PANEL
Anion gap: 8 (ref 5–15)
BUN: 31 mg/dL — ABNORMAL HIGH (ref 6–20)
CO2: 23 mmol/L (ref 22–32)
Calcium: 10.2 mg/dL (ref 8.9–10.3)
Chloride: 101 mmol/L (ref 101–111)
Creatinine, Ser: 1 mg/dL (ref 0.61–1.24)
GFR calc Af Amer: >60 mL/min (ref >60)
GFR calc non Af Amer: >60 mL/min (ref >60)
Glucose, Bld: 192 mg/dL — ABNORMAL HIGH (ref 65–99)
Potassium: 4.4 mmol/L (ref 3.5–5.1)
Sodium: 132 mmol/L — ABNORMAL LOW (ref 135–145)

## 2015-12-13 LAB — CBC
HCT: 27.1 % — ABNORMAL LOW (ref 39.0–52.0)
Hemoglobin: 8.5 g/dL — ABNORMAL LOW (ref 13.0–17.0)
MCH: 25.2 pg — ABNORMAL LOW (ref 26.0–34.0)
MCHC: 31.4 g/dL (ref 30.0–36.0)
MCV: 80.4 fL (ref 78.0–100.0)
Platelets: 610 10*3/uL — ABNORMAL HIGH (ref 150–400)
RBC: 3.37 MIL/uL — ABNORMAL LOW (ref 4.22–5.81)
RDW: 15.2 % (ref 11.5–15.5)
WBC: 24.9 10*3/uL — ABNORMAL HIGH (ref 4.0–10.5)

## 2015-12-13 MED ORDER — ACETAMINOPHEN 325 MG PO TABS
650.0000 mg | ORAL_TABLET | Freq: Four times a day (QID) | ORAL | Status: DC | PRN
  Administered 2015-12-15 – 2015-12-27 (×12): 650 mg via ORAL
  Filled 2015-12-13 (×14): qty 2

## 2015-12-13 MED ORDER — IBUPROFEN 200 MG PO TABS
400.0000 mg | ORAL_TABLET | ORAL | Status: DC | PRN
  Administered 2015-12-16 – 2015-12-18 (×4): 400 mg via ORAL
  Filled 2015-12-13 (×4): qty 2

## 2015-12-13 MED ORDER — IBUPROFEN 200 MG PO TABS
400.0000 mg | ORAL_TABLET | Freq: Four times a day (QID) | ORAL | Status: DC | PRN
  Administered 2015-12-13: 400 mg via ORAL
  Filled 2015-12-13: qty 2

## 2015-12-13 MED ORDER — INSULIN GLARGINE 100 UNIT/ML ~~LOC~~ SOLN
10.0000 [IU] | Freq: Every day | SUBCUTANEOUS | Status: DC
  Administered 2015-12-13 – 2015-12-14 (×2): 10 [IU] via SUBCUTANEOUS
  Filled 2015-12-13 (×3): qty 0.1

## 2015-12-13 MED ORDER — TIGECYCLINE 50 MG IV SOLR
100.0000 mg | Freq: Once | INTRAVENOUS | Status: AC
  Administered 2015-12-13: 100 mg via INTRAVENOUS
  Filled 2015-12-13: qty 100

## 2015-12-13 MED ORDER — IBUPROFEN 200 MG PO TABS: 400.0000 mg | ORAL_TABLET | ORAL | Status: DC | PRN

## 2015-12-13 MED ORDER — TIGECYCLINE 50 MG IV SOLR
50.0000 mg | Freq: Two times a day (BID) | INTRAVENOUS | Status: DC
  Administered 2015-12-13 – 2015-12-15 (×4): 50 mg via INTRAVENOUS
  Filled 2015-12-13 (×5): qty 100

## 2015-12-13 MED ORDER — GLUCERNA 1.2 CAL PO LIQD
1000.0000 mL | ORAL | Status: DC
  Administered 2015-12-14 – 2016-01-04 (×25): 1000 mL
  Filled 2015-12-13 (×41): qty 1000

## 2015-12-13 MED ORDER — DEXTROSE 5 % IV SOLN
2.0000 g | Freq: Three times a day (TID) | INTRAVENOUS | Status: DC
  Administered 2015-12-13 – 2015-12-15 (×6): 2 g via INTRAVENOUS
  Filled 2015-12-13 (×8): qty 2

## 2015-12-13 NOTE — Progress Notes (Signed)
Spoke to [employer] who suggest that a cooling blanket be rented. In the mean time, ice pack applied and rotated for through the night with no success in decreasing patient's temp. AC phoned and updated as well. Patient mother/caretake is at bedside.

## 2015-12-13 NOTE — Progress Notes (Signed)
Patient ID: Jason [name], male   DOB: [birth year], 30 y.o.   MRN: [id number]         [facility] for Infectious Disease    Date of Admission:  12/06/2015           Day 7 vancomycin        Day 7 levofloxacin  Principal Problem:   HCAP (healthcare-associated pneumonia) Active Problems:   Acute on chronic respiratory failure ([name])   Recurrent UTI   Pressure ulcer   Neurogenic bladder   Respiratory failure, chronic ([name])   Paraplegia ([name])   Acute respiratory failure ([name])   Pneumonia   Fistula   . antiseptic oral rinse  7 mL Mouth Rinse QID  . baclofen  20 mg Oral Q6H  . buPROPion  150 mg Oral Daily  . chlorhexidine gluconate  15 mL Mouth Rinse BID  . fentaNYL  50 mcg Transdermal Q72H  . heparin  5,000 Units Subcutaneous 3 times per day  . insulin aspart  0-9 Units Subcutaneous 6 times per day  . insulin aspart  2 Units Subcutaneous 6 times per day  . levofloxacin (LEVAQUIN) IV  750 mg Intravenous Q24H  . midodrine  20 mg Per Tube TID WC  . pantoprazole (PROTONIX) IV  40 mg Intravenous QHS  . pregabalin  100 mg Per Tube TID  . vancomycin  500 mg Intravenous Q12H    Review of Systems: Review of Systems  Unable to perform ROS: other  Constitutional:       He is sound asleep. His mother is sleeping at the bedside as well.    Past Medical History  Diagnosis Date  . GERD (gastroesophageal reflux disease)   . Asthma   . Hx MRSA infection     on face  . Gastroparesis   . Diabetic neuropathy ([place])   . Seizures ([place])   . Family history of anesthesia complication     Pt mother can't have epidural procedures  . Dysrhythmia   . Pneumonia   . Arthritis   . Fibromyalgia   . 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 Type I diabetes mellitus ([place])     sees Dr. Loanne Drilling   . Syncope 02/16/2015  . Multiple allergies 11/02/2015  . Recurrent UTI 11/02/2015    Social History  Substance Use Topics  . Smoking status: Former Smoker -- 1.00  packs/day for 12 years    Types: Cigars, Cigarettes    Quit date: 09/07/2014  . Smokeless tobacco: Never Used  . Alcohol Use: No    Family History  Problem Relation Age of Onset  . Diabetes Father   . Hypertension Father   . Asthma      fhx  . Hypertension      fhx  . Stroke      fhx  . Heart disease Mother    Allergies  Allergen Reactions  . Cefuroxime Axetil Anaphylaxis  . Ertapenem Other (See Comments)    Rash and confusion-->tolerated Imipenem   . Morphine And Related Other (See Comments)    Changed mental status, confusion, headache, visual hallucination  . Penicillins Anaphylaxis and Other (See Comments)    Tolerated Imipenem; no reaction to 7 day course of amoxicillin in 2015 Has patient had a PCN reaction causing immediate rash, facial/tongue/throat swelling, SOB or lightheadedness with hypotension: Yes Has patient had a PCN reaction causing severe rash involving mucus membranes or skin necrosis: No Has patient had a PCN  reaction that required hospitalization Yes Has patient had a PCN reaction occurring within the last 10 years: No If all of the above answers are "NO", then may proceed with Cephalo  . Sulfa Antibiotics Anaphylaxis, Shortness Of Breath and Other (See Comments)  . Tessalon [Benzonatate] Anaphylaxis  . Shellfish Allergy Itching and Other (See Comments)    Took benadryl to alleviate reaction  . Levaquin [Levofloxacin In D5w] Swelling    Hand and forearm swelling  . Nsaids Other (See Comments)    Risk of bleeding  . Miripirium Rash and Other (See Comments)    Change in mental status    OBJECTIVE: Filed Vitals:   12/13/15 0400 12/13/15 0401 12/13/15 0731 12/13/15 0800  BP: 127/79  104/67 108/84  Pulse: 97 91 80 79  Temp:  102.2 F (39 C)  98.3 F (36.8 C)  TempSrc:  Rectal  Oral  Resp: 21 16 16 16   Height:      Weight:  132 lb 4.8 oz (60.011 kg)    SpO2: 100% 97% 100% 100%   Body mass index is 20.12 kg/(m^2).  Physical Exam    Constitutional: No distress.  Cardiovascular: Normal rate and regular rhythm.   No murmur heard. Pulmonary/Chest:  Clear lungs anteriorly.  Abdominal: Soft.  Musculoskeletal:  Swelling on the dorsum of his hands has improved.  Skin: No rash noted.    Lab Results Lab Results  Component Value Date   WBC 24.9* 12/13/2015   HGB 8.5* 12/13/2015   HCT 27.1* 12/13/2015   MCV 80.4 12/13/2015   PLT 610* 12/13/2015    Lab Results  Component Value Date   CREATININE 1.00 12/13/2015   BUN 31* 12/13/2015   NA 132* 12/13/2015   K 4.4 12/13/2015   CL 101 12/13/2015   CO2 23 12/13/2015    Lab Results  Component Value Date   ALT 10* 12/06/2015   AST 15 12/06/2015   ALKPHOS 102 12/06/2015   BILITOT 0.4 12/06/2015     Microbiology: Recent Results (from the past 240 hour(s))  Culture, blood (routine x 2)     Status: None   Collection Time: 12/06/15  8:07 PM  Result Value Ref Range Status   Specimen Description BLOOD RIGHT ANTECUBITAL  Final   Special Requests BOTTLES DRAWN AEROBIC AND ANAEROBIC 5CC  Final   Culture NO GROWTH 5 DAYS  Final   Report Status 12/11/2015 FINAL  Final  Culture, blood (routine x 2)     Status: None   Collection Time: 12/06/15  8:16 PM  Result Value Ref Range Status   Specimen Description BLOOD LEFT HAND  Final   Special Requests IN PEDIATRIC BOTTLE 4CC  Final   Culture NO GROWTH 5 DAYS  Final   Report Status 12/11/2015 FINAL  Final  Urine culture     Status: None   Collection Time: 12/06/15 11:03 PM  Result Value Ref Range Status   Specimen Description URINE, CATHETERIZED  Final   Special Requests NONE  Final   Culture MULTIPLE SPECIES PRESENT, SUGGEST RECOLLECTION  Final   Report Status 12/08/2015 FINAL  Final  Culture, blood (Routine X 2) w Reflex to ID Panel     Status: None   Collection Time: 12/07/15  3:07 AM  Result Value Ref Range Status   Specimen Description BLOOD RIGHT ARM  Final   Special Requests BOTTLES DRAWN AEROBIC AND ANAEROBIC  5ML  Final   Culture NO GROWTH 5 DAYS  Final   Report Status 12/12/2015 FINAL  Final  Culture, blood (Routine X 2) w Reflex to ID Panel     Status: None   Collection Time: 12/07/15  3:15 AM  Result Value Ref Range Status   Specimen Description BLOOD RIGHT HAND  Final   Special Requests IN PEDIATRIC BOTTLE 4ML  Final   Culture NO GROWTH 5 DAYS  Final   Report Status 12/12/2015 FINAL  Final  MRSA PCR Screening     Status: Abnormal   Collection Time: 12/07/15  6:30 PM  Result Value Ref Range Status   MRSA by PCR POSITIVE (A) NEGATIVE Final    Comment:        The GeneXpert MRSA Assay (FDA approved for NASAL specimens only), is one component of a comprehensive MRSA colonization surveillance program. It is not intended to diagnose MRSA infection nor to guide or monitor treatment for MRSA infections. RESULT CALLED TO, READ BACK BY AND VERIFIED WITH: K.GARCIA,RN @2131  BY L.PITT 12/07/15   Culture, respiratory (NON-Expectorated)     Status: None (Preliminary result)   Collection Time: 12/12/15 11:17 AM  Result Value Ref Range Status   Specimen Description TRACHEAL ASPIRATE  Final   Special Requests Normal  Final   Gram Stain   Final    ABUNDANT WBC PRESENT, PREDOMINANTLY PMN FEW SQUAMOUS EPITHELIAL CELLS PRESENT MODERATE GRAM POSITIVE COCCI IN PAIRS FEW GRAM NEGATIVE RODS Performed at [facility]    Culture PENDING  Incomplete   Report Status PENDING  Incomplete     ASSESSMENT: His fevers continued to go up on empiric antibiotic therapy for HCAP. His respiratory failure has improved and I suspect that his pneumonia is better as well. I'll repeat a chest x-ray today. Other potential sources of fever are IV associated phlebitis, UTI or new infection related to recent fecal contamination of sacral wounds.  PLAN: 1. Chest x-ray 2. Await results of blood and urine cultures 3. Change antibiotic therapy to tigecycline and aztreonam  Michel Bickers, MD [facility] for  [place] [phone] pager   [phone] cell 12/13/2015, 8:48 AM

## 2015-12-13 NOTE — Progress Notes (Addendum)
[facility] Progress Note Patient Name: Jason [name] DOB: [birth year] MRN: [id number]   Date of Service  12/13/2015  HPI/Events of Note  Fever to 102.2 F. Nurse states that [name] does not have any cooling blankets available at this time. Hospital may need to rent or buy another cooling blanket.   eICU Interventions  Will increase the Motrin to 400 mg PO Q 4 hours PRN Temp > 101.5 F and add Tylenol 650 mg PO Q 6 hours PRN Temp > 101.5 F.     Intervention Category Intermediate Interventions: Other:  [name],Steven Cornelia Copa 12/13/2015, 5:11 AM

## 2015-12-13 NOTE — Progress Notes (Signed)
Patient temp is 103 orally and 102.21 rectally. Patient given medications as ordered, in addition to decreased enviromental temperatures with no change. Jason [name] in equipment said there are no cooling machine available. MD Sommers paged for update. Awaiting call back.

## 2015-12-13 NOTE — Progress Notes (Signed)
Pt refuseing suction and CPT at this time. Will monotor

## 2015-12-13 NOTE — Consult Note (Addendum)
WOC wound follow up:  Vac was unable to maintain a seal during the weekend and Monday related to frequent stools from the rectum, in addition to the functioning ostomy.  Pt has been evaluated by surgical team and they discussed this medical condition with his mother. Re-applied 3 Vac dressings leaving the rectum area free from dressings, in hopes that suction  remain in place if another stool occurs. 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  Previous gauze packing with mod amt yellow-green drainage and some odor consistent with pseudomonas. Pressure Ulcer POA: Yes x 3 Dressing procedure/placement/frequency: Applied Vac dressing to left and right iscium and sacrum wounds; one piece black foam to each wound, bridged together, then bridged to hip to reduce pressure from the track pad.Applied hydrocolloid around wound edges to maintain seal and a barrier ring over the top to protect from stool.  Rectum area is free from dressings.  Pt tolerated without pain. Attached Y connector to one machine with cont suction on at 159mm. Mother states she is very familiar with Vac application and changing process. Plan for dressing change on FRI.  If stooling causes Vac to loose seal, then orders provided for the staff nurse to apply moist gauze dressings until [place] can re-apply the Vac dressings. He can resume use of his own negative pressure device after discharge. One hour spent performing this complex Vac dressing. Julien Girt MSN, RN, [place], [place], [place]

## 2015-12-13 NOTE — Progress Notes (Signed)
Speech Language Pathology Treatment: Nada Boozer Speaking valve  Patient Details Name: Jason [name] MRN: [id number] DOB: [birth year] Today's Date: 12/13/2015 Time: 6644-0347 SLP Time Calculation (min) (ACUTE ONLY): 10 min  Assessment / Plan / Recommendation Clinical Impression  Cuff deflated and PMSV donned upon SLP arrival. RR, pulse and SpO2 all remained WFL during session. Phonation achieved and normal vocal quality and intelligibility noted, with no back pressure that could be indicative of air trapping. Pt and mother educated re: proper use of PMSV and importance of removal while sleeping. Recommend PMSV donned during all waking hours and with all meals. No further SLP intervention warranted, as pt successful PMSV user PTA.    HPI HPI: Jason [name] is a 30 y.o. 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 He is followed in trach clinic and he has been doing well and has not required any ventilator support for quite some time, last time roughly November 2016. He had been able to use PMV 24 hours a day without problem.  On 12/06/15, he was brought to [facility] ED due to drop in O2 saturations. This began 4 - 5 days prior and got to low of 88%. Mother began to put him on the vent for 2 - 4 hours at a time in hopes of improving O2 sats. She also noticed that his heart rate would intermittently climb into the low 100's. He has had a low grade fever, but mom reports this has been going on for over a month now. Denies any cough. He does have a hx of tracheitis and mom states a few months ago he had problems with excessive secretions. Over the past 4 - 5 days, secretions have not been to bad and mom has not been able to suction much from trach.  In ED, he had saturations in high 90's on his home vent settings. CXR revealed bibasilar densities concerning for PNA.       SLP Plan  All goals met     Recommendations         Patient  may use Passy-Muir Speech Valve: During all waking hours (remove during sleep);During PO intake/meals;Caregiver trained to provide supervision PMSV Supervision: Intermittent      Oral Care Recommendations: Oral care BID Follow up Recommendations: 24 hour supervision/assistance Plan: All goals met     GO                Ksean Vale 12/13/2015, 1:19 PM  Titus Mould, Student-SLP

## 2015-12-13 NOTE — Progress Notes (Signed)
PULMONARY / CRITICAL CARE MEDICINE   Name: Jason [name] MRN: [id number] DOB: 1986/07/04    ADMISSION DATE:  12/06/2015 CONSULTATION DATE:  12/06/15  REFERRING MD:  EDP  CHIEF COMPLAINT:  Decrease in O2 sats, fever, PNA  SUBJECTIVE:  Febrile overnight, given PRN ibuprofen, afebrile this morning   VITAL SIGNS: BP 108/84 mmHg  Pulse 79  Temp(Src) 98.3 F (36.8 C) (Oral)  Resp 16  Ht 5\' 8"  (1.727 m)  Wt 132 lb 4.8 oz (60.011 kg)  BMI 20.12 kg/m2  SpO2 100%  HEMODYNAMICS:    VENTILATOR SETTINGS: Vent Mode:  [-]  FiO2 (%):  [28 %-35 %] 28 %  INTAKE / OUTPUT: I/O last 3 completed shifts: In: 5458.8 [P.O.:2200; NG/GT:2658.8; IV Piggyback:600] Out: [id number] [Urine:1500; Stool:975]   PHYSICAL EXAMINATION: General: Chronically ill appearing, awake, interactive Neuro: Awake, communicative  HEENT: PERRL, Clean trach site. Cardiovascular: RRR, no MRG, no edema Lungs: Clear, no wheeze or crackles, no use of accessory muscles. Abdomen: PEG in place, + BS, ostomy Musculoskeletal: Muscle wasting bilaterally  Skin: No rashes.  LABS:  BMET  Recent Labs Lab 12/09/15 0345 12/12/15 0238 12/13/15 0558  NA 137 132* 132*  K 4.1 5.3* 4.4  CL 106 100* 101  CO2 21* 21* 23  BUN 30* 26* 31*  CREATININE 0.74 1.00 1.00  GLUCOSE 214* 290* 192*    Electrolytes  Recent Labs Lab 12/07/15 0319  12/09/15 0345 12/12/15 0238 12/13/15 0558  CALCIUM 12.8*  < > 12.1* 10.5* 10.2  MG 1.7  --   --   --   --   PHOS 3.5  --   --   --   --   < > = values in this interval not displayed.  CBC  Recent Labs Lab 12/08/15 0400 12/09/15 0345 12/13/15 0558  WBC 24.7* 24.9* 24.9*  HGB 6.8* 7.7* 8.5*  HCT 21.8* 25.4* 27.1*  PLT 666* 678* 610*    Coag's No results for input(s): APTT, INR in the last 168 hours.  Sepsis Markers  Recent Labs Lab 12/06/15 2027 12/06/15 2254 12/07/15 0318 12/07/15 0319 12/08/15 0400  LATICACIDVEN 1.42 0.68  --  1.0  --   PROCALCITON  --   --   6.53  --  7.36    ABG  Recent Labs Lab 12/07/15 0545  PHART 7.369  PCO2ART 30.5*  PO2ART 61.0*    Liver Enzymes  Recent Labs Lab 12/06/15 2008  AST 15  ALT 10*  ALKPHOS 102  BILITOT 0.4  ALBUMIN 2.6*    Cardiac Enzymes No results for input(s): TROPONINI, PROBNP in the last 168 hours.  Glucose  Recent Labs Lab 12/12/15 1605 12/12/15 2003 12/12/15 2241 12/13/15 0014 12/13/15 0334 12/13/15 0759  GLUCAP 157* 308* 275* 354* 272* 244*    Imaging No results found.   STUDIES:  CXR 02/28 > bibasilar airspace densities concerning for PNA. CT abdo/ pelvis>>>no fistula CXR 3/7 >>  CULTURES: Blood 02/28 >>No growth  Urine 02/28 >>Multiple species, suggest recollection Blood 3/1>> No growth  Sputum 3/6 >>> Abundant WBC, Predominantly PMN few squamous epithelial cells present, moderate gram positive cocci in pairs few gram negative rods Blood Culture 3/6 >> Urine Culture 3/7 >>  MRSA pos screen  ANTIBIOTICS: Vanc 02/28 (7/7)>> 3/7 Levaquin 02/28 (7/7) >>3/7 Tigecycline 3/7 >> Aztreonam 3/7 >>  SIGNIFICANT EVENTS: 2/28  Admitted with probable HCAP 3/2- no fevers, low secretions, remains vented, refusing Chest PT and other treatments 3/3 improved to TC, pcxr better 3/5- trach collar  24  Hrs 3/6- Tmax 103.2  LINES/TUBES:  Chronic trach / PEG >> Suprapubic catheter   ASSESSMENT / PLAN:  PULMONARY A: 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 HCAP P:   Continue abx Tolerating trach collar.  PMV trials Chest PT Mobilize.  CARDIOVASCULAR A:  Chronic hypotension - on midodrine. P:  Continue midodrine  RENAL A:   Hyponatremia - chronic NONAG acidosis resolving 3/4 P:   Stable  GASTROINTESTINAL A:   GERD, gastroparesis Nutrition R/o chronic fistula - NEG CT 3/4 P:   Pantoprazole TF's at goal  HEMATOLOGIC A:   Anemia - chronic, some dilution now Thrombocytosis - chronic VTE Prophylaxis P:   Transfuse for Hgb < 7 SCD's / heparin  INFECTIOUS A:   UTI - hx pseudomonal UTI in past with resistance to multiple abx Probable HCAP vs tracheitis Hx sacral wounds Some PCT rise about the same clinically P:   D/C Vac and Levofloxacin per ID  Per ID start Tigecycline and Aztreonam on 3/7 WOC following  Change out suprapubic catheter (20 [place])  ENDOCRINE A:   DM Hyperglycemia  Adrenal insuff likely P:   SSI cont novolog 2 untis q4 hrs. Add lantus 10 units daily Low Cortisol noted- but no need for additional steroids now.   NEUROLOGIC A:   Hx seizures (not on meds), paraplegia, fibromyalgia, diabetic neuropathy P:   Fentanyl PRN pain Home bupropion, lorazepam, & lyrica   Family updated: Mother at bedside 3/1 updated, pt updated 3/2, 3/3, 3/4, 3/5. 3/6 Interdisciplinary Family Meeting v Palliative Care Meeting:    Erick Colace ACNP-BC [place] Pager # [phone] OR # [phone] if no answer

## 2015-12-13 NOTE — Progress Notes (Signed)
[facility] Progress Note Patient Name: Jason [name] DOB: 04/25/1986 MRN: [id number]   Date of Service  12/13/2015  HPI/Events of Note  Urine cloudy.  eICU Interventions  Will culture urine.     Intervention Category Major Interventions: Infection - evaluation and management  [name],Steven Eugene 12/13/2015, 4:56 AM

## 2015-12-13 NOTE — Progress Notes (Signed)
[facility] Progress Note Patient Name: Jason [name] DOB: 07-31-1986 MRN: [id number]   Date of Service  12/13/2015  HPI/Events of Note  Fever to 102.9 F. Currently pan cultured earlier today and on Vancomycin and Levaquin. Creatinine = 1.0.  eICU Interventions  Will order: 1. Increase Motrin to 400 mg PO Q 6 hours. 2. Cooling blanket.      Intervention Category Intermediate Interventions: Other:  Lysle Dingwall 12/13/2015, 12:58 AM

## 2015-12-14 ENCOUNTER — Other Ambulatory Visit: Payer: Self-pay | Admitting: Family Medicine

## 2015-12-14 DIAGNOSIS — R509 Fever, unspecified: Secondary | ICD-10-CM | POA: Diagnosis present

## 2015-12-14 DIAGNOSIS — B379 Candidiasis, unspecified: Secondary | ICD-10-CM

## 2015-12-14 DIAGNOSIS — I808 Phlebitis and thrombophlebitis of other sites: Secondary | ICD-10-CM

## 2015-12-14 DIAGNOSIS — L89159 Pressure ulcer of sacral region, unspecified stage: Secondary | ICD-10-CM

## 2015-12-14 LAB — BASIC METABOLIC PANEL
ANION GAP: 9 (ref 5–15)
BUN: 49 mg/dL — ABNORMAL HIGH (ref 6–20)
CO2: 22 mmol/L (ref 22–32)
Calcium: 9.9 mg/dL (ref 8.9–10.3)
Chloride: 102 mmol/L (ref 101–111)
Creatinine, Ser: 0.96 mg/dL (ref 0.61–1.24)
GFR calc Af Amer: >60 mL/min (ref >60)
GLUCOSE: 156 mg/dL — AB (ref 65–99)
POTASSIUM: 4.6 mmol/L (ref 3.5–5.1)
Sodium: 133 mmol/L — ABNORMAL LOW (ref 135–145)

## 2015-12-14 LAB — CBC
HEMATOCRIT: 29.8 % — AB (ref 39.0–52.0)
HEMOGLOBIN: 9.2 g/dL — AB (ref 13.0–17.0)
MCH: 24.8 pg — ABNORMAL LOW (ref 26.0–34.0)
MCHC: 30.9 g/dL (ref 30.0–36.0)
MCV: 80.3 fL (ref 78.0–100.0)
Platelets: 682 10*3/uL — ABNORMAL HIGH (ref 150–400)
RBC: 3.71 MIL/uL — ABNORMAL LOW (ref 4.22–5.81)
RDW: 15.4 % (ref 11.5–15.5)
WBC: 21.6 10*3/uL — ABNORMAL HIGH (ref 4.0–10.5)

## 2015-12-14 LAB — URINE CULTURE
Culture: >=100000
Special Requests: NORMAL

## 2015-12-14 LAB — GLUCOSE, CAPILLARY
GLUCOSE-CAPILLARY: 186 mg/dL — AB (ref 65–99)
GLUCOSE-CAPILLARY: 116 mg/dL — AB (ref 65–99)
GLUCOSE-CAPILLARY: 95 mg/dL (ref 65–99)
Glucose-Capillary: 168 mg/dL — ABNORMAL HIGH (ref 65–99)
Glucose-Capillary: 193 mg/dL — ABNORMAL HIGH (ref 65–99)
Glucose-Capillary: 163 mg/dL — ABNORMAL HIGH (ref 65–99)

## 2015-12-14 MED ORDER — PANTOPRAZOLE SODIUM 40 MG PO PACK
40.0000 mg | PACK | Freq: Every day | ORAL | Status: DC
  Administered 2015-12-14 – 2015-12-27 (×14): 40 mg
  Filled 2015-12-14 (×15): qty 20

## 2015-12-14 NOTE — Progress Notes (Signed)
PULMONARY / CRITICAL CARE MEDICINE   Name: Jason [name] MRN: [id number] DOB: September 28, 1986    ADMISSION DATE:  12/06/2015 CONSULTATION DATE:  12/06/15  REFERRING MD:  EDP  CHIEF COMPLAINT:  Decrease in O2 sats, fever, PNA  SUBJECTIVE:  Afebrile overnight, tolerating trach collar, slept well    VITAL SIGNS: BP 95/70 mmHg  Pulse 86  Temp(Src) 99.1 F (37.3 C) (Oral)  Resp 20  Ht 5\' 8"  (1.727 m)  Wt 123 lb 10.9 oz (56.1 kg)  BMI 18.81 kg/m2  SpO2 98%  HEMODYNAMICS:    VENTILATOR SETTINGS: Vent Mode:  [-]  FiO2 (%):  [28 %] 28 %  INTAKE / OUTPUT: I/O last 3 completed shifts: In: 1551.7 [NG/GT:1201.7; IV Piggyback:350] Out: 3025 [Urine:2825; Stool:200]   PHYSICAL EXAMINATION: General: Chronically ill appearing, awake, interactive Neuro: Awake, communicative  HEENT: PERRL, Clean trach site. Cardiovascular: RRR, no MRG, no edema Lungs: Clear, no wheeze or crackles, no use of accessory muscles. Abdomen: PEG in place, + BS, ostomy Musculoskeletal: Muscle wasting bilaterally  Skin: No rashes.  LABS:  BMET  Recent Labs Lab 12/09/15 0345 12/12/15 0238 12/13/15 0558  NA 137 132* 132*  K 4.1 5.3* 4.4  CL 106 100* 101  CO2 21* 21* 23  BUN 30* 26* 31*  CREATININE 0.74 1.00 1.00  GLUCOSE 214* 290* 192*    Electrolytes  Recent Labs Lab 12/09/15 0345 12/12/15 0238 12/13/15 0558  CALCIUM 12.1* 10.5* 10.2    CBC  Recent Labs Lab 12/08/15 0400 12/09/15 0345 12/13/15 0558  WBC 24.7* 24.9* 24.9*  HGB 6.8* 7.7* 8.5*  HCT 21.8* 25.4* 27.1*  PLT 666* 678* 610*    Coag's No results for input(s): APTT, INR in the last 168 hours.  Sepsis Markers  Recent Labs Lab 12/08/15 0400  PROCALCITON 7.36    ABG No results for input(s): PHART, PCO2ART, PO2ART in the last 168 hours.  Liver Enzymes No results for input(s): AST, ALT, ALKPHOS, BILITOT, ALBUMIN in the last 168 hours.  Cardiac Enzymes No results for input(s): TROPONINI, PROBNP in the last  168 hours.  Glucose  Recent Labs Lab 12/13/15 0759 12/13/15 1206 12/13/15 1532 12/13/15 2049 12/14/15 0011 12/14/15 0421  GLUCAP 244* 278* 221* 98 193* 168*    Imaging Dg Chest Port 1 View  12/13/2015  CLINICAL DATA:  Pneumonia. EXAM: PORTABLE CHEST 1 VIEW COMPARISON:  December 09, 2015. FINDINGS: Stable cardiomediastinal silhouette. No pneumothorax is noted. Mildly increased bilateral pleural effusions are noted with probable associated atelectasis. Tracheostomy tube is unchanged in position. Bony thorax is unremarkable. IMPRESSION: Mildly increased bibasilar opacities are noted most consistent with subsegmental atelectasis and associated pleural effusions. Electronically Signed   By: Marijo Conception, M.D.   On: 12/13/2015 10:16     STUDIES:  CXR 02/28 > bibasilar airspace densities concerning for PNA. CT abdo/ pelvis>>>no fistula CXR 3/7 >>mildly increased bibasilar opacities are noted most consistent with subsegmental atelectasis and associated pleural effusions.   CULTURES: Blood 02/28 >>No growth  Urine 02/28 >>Multiple species, suggest recollection Blood 3/1>> No growth  Sputum 3/6 >>> Abundant WBC, Predominantly PMN few squamous epithelial cells present, moderate gram positive cocci in pairs few gram negative rods Blood Culture 3/6 >> Urine Culture 3/7 >>  MRSA pos screen  ANTIBIOTICS: Vanc 02/28 (7/7)>> 3/7 Levaquin 02/28 (7/7) >>3/7 Tigecycline 3/7 >> Aztreonam 3/7 >>  SIGNIFICANT EVENTS: 2/28  Admitted with probable HCAP 3/2- no fevers, low secretions, remains vented, refusing Chest PT and other treatments 3/3 improved to  TC, pcxr better 3/5- trach collar 24  Hrs 3/6- Tmax 103.2  LINES/TUBES:  Chronic trach / PEG >> Suprapubic catheter   ASSESSMENT / PLAN:  PULMONARY A: 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 HCAP P:   Continue abx Tolerating trach collar.  PMV trials Chest PT Mobilize. Repeat CXR  3/9  CARDIOVASCULAR A:  Chronic hypotension - on midodrine. P:  Continue midodrine  RENAL A:   Hyponatremia - chronic NONAG acidosis resolving 3/4 P:   Trend BMP  Stable  GASTROINTESTINAL A:   GERD, gastroparesis Nutrition R/o chronic fistula - NEG CT 3/4 P:   Pantoprazole TF's at goal  HEMATOLOGIC A:   Anemia - chronic, some dilution now Thrombocytosis - chronic VTE Prophylaxis P:  Transfuse for Hgb < 7 SCD's / heparin  INFECTIOUS A:   UTI - hx pseudomonal UTI in past with resistance to multiple abx Probable HCAP vs tracheitis Hx sacral wounds Some PCT rise about the same clinically P:   D/C Vac and Levofloxacin per ID  Per ID start Tigecycline and Aztreonam on 3/7 WOC following  Changed out suprapubic catheter (20 [place]) Trend CBC   ENDOCRINE A:   DM Hyperglycemia  Adrenal insuff likely P:   SSI cont novolog 2 untis q4 hrs. Add lantus 10 units daily Low Cortisol noted- but no need for additional steroids now.   NEUROLOGIC A:   Hx seizures (not on meds), paraplegia, fibromyalgia, diabetic neuropathy P:   Fentanyl PRN pain Home bupropion, lorazepam, & lyrica   Family updated: Mother at bedside 3/1 updated, pt updated 3/2, 3/3, 3/4, 3/5. 3/6 Interdisciplinary Family Meeting v Palliative Care Meeting:    Erick Colace ACNP-BC [place] Pager # [phone] OR # [phone] if no answer

## 2015-12-14 NOTE — Progress Notes (Signed)
Patient ID: Jason [name], male   DOB: 20-Dec-1985, 30 y.o.   MRN: [id number]         [facility] for Infectious Disease    Date of Admission:  12/06/2015   Total days of antibiotics 8        Day 2 aztreonam        Day 2 tigecycline         Principal Problem:   HCAP (healthcare-associated pneumonia) Active Problems:   Acute on chronic respiratory failure ([name])   Recurrent UTI   Pressure ulcer   Neurogenic bladder   Respiratory failure, chronic ([name])   Paraplegia ([name])   Acute respiratory failure ([name])   Pneumonia   Fistula   . antiseptic oral rinse  7 mL Mouth Rinse QID  . aztreonam  2 g Intravenous 3 times per day  . baclofen  20 mg Oral Q6H  . buPROPion  150 mg Oral Daily  . chlorhexidine gluconate  15 mL Mouth Rinse BID  . fentaNYL  50 mcg Transdermal Q72H  . heparin  5,000 Units Subcutaneous 3 times per day  . insulin aspart  0-9 Units Subcutaneous 6 times per day  . insulin aspart  2 Units Subcutaneous 6 times per day  . insulin glargine  10 Units Subcutaneous Daily  . midodrine  20 mg Per Tube TID WC  . pantoprazole sodium  40 mg Per Tube Daily  . pregabalin  100 mg Per Tube TID  . tigecycline (TYGACIL) IVPB  50 mg Intravenous Q12H    SUBJECTIVE: Overall he feels better. He states that he was really not aware of having high fevers yesterday. His cough and shortness of breath have improved. He states that he is feeling more anxious than usual.  Review of Systems: Review of Systems  Constitutional: Negative for fever, chills, weight loss, malaise/fatigue and diaphoresis.  HENT: Negative for sore throat.   Respiratory: Positive for cough and shortness of breath. Negative for sputum production.   Cardiovascular: Negative for chest pain.  Gastrointestinal: Negative for nausea, vomiting and diarrhea.  Musculoskeletal: Negative for myalgias and joint pain.  Skin: Negative for rash.  Neurological: Positive for focal weakness. Negative for headaches.    Psychiatric/Behavioral: Negative for depression. The patient is nervous/anxious.     Past Medical History  Diagnosis Date  . GERD (gastroesophageal reflux disease)   . Asthma   . Hx MRSA infection     on face  . Gastroparesis   . Diabetic neuropathy ([place])   . Seizures ([place])   . Family history of anesthesia complication     Pt mother can't have epidural procedures  . Dysrhythmia   . Pneumonia   . Arthritis   . Fibromyalgia   . 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 Type I diabetes mellitus ([place])     sees Dr. Loanne Drilling   . Syncope 02/16/2015  . Multiple allergies 11/02/2015  . Recurrent UTI 11/02/2015    Social History  Substance Use Topics  . Smoking status: Former Smoker -- 1.00 packs/day for 12 years    Types: Cigars, Cigarettes    Quit date: 09/07/2014  . Smokeless tobacco: Never Used  . Alcohol Use: No    Family History  Problem Relation Age of Onset  . Diabetes Father   . Hypertension Father   . Asthma      fhx  . Hypertension      fhx  . Stroke  fhx  . Heart disease Mother    Allergies  Allergen Reactions  . Cefuroxime Axetil Anaphylaxis  . Ertapenem Other (See Comments)    Rash and confusion-->tolerated Imipenem   . Morphine And Related Other (See Comments)    Changed mental status, confusion, headache, visual hallucination  . Penicillins Anaphylaxis and Other (See Comments)    Tolerated Imipenem; no reaction to 7 day course of amoxicillin in 2015 Has patient had a PCN reaction causing immediate rash, facial/tongue/throat swelling, SOB or lightheadedness with hypotension: Yes Has patient had a PCN reaction causing severe rash involving mucus membranes or skin necrosis: No Has patient had a PCN reaction that required hospitalization Yes Has patient had a PCN reaction occurring within the last 10 years: No If all of the above answers are "NO", then may proceed with Cephalo  . Sulfa Antibiotics Anaphylaxis, Shortness Of  Breath and Other (See Comments)  . Tessalon [Benzonatate] Anaphylaxis  . Shellfish Allergy Itching and Other (See Comments)    Took benadryl to alleviate reaction  . Levaquin [Levofloxacin In D5w] Swelling    Hand and forearm swelling  . Nsaids Other (See Comments)    Risk of bleeding  . Miripirium Rash and Other (See Comments)    Change in mental status    OBJECTIVE: Filed Vitals:   12/14/15 0744 12/14/15 0800 12/14/15 1111 12/14/15 1143  BP: 92/64 95/70 140/94 135/89  Pulse: 91 86 91 85  Temp: 99.1 F (37.3 C)   99.4 F (37.4 C)  TempSrc: Oral   Oral  Resp: 17 20 20 26   Height:      Weight:      SpO2: 97% 98% 99% 96%   Body mass index is 18.81 kg/(m^2).  Physical Exam  Constitutional: He is oriented to person, place, and time.  He is alert and in no distress.  HENT:  Mouth/Throat: No oropharyngeal exudate.  Eyes: Conjunctivae are normal.  Cardiovascular: Normal rate.   No murmur heard. Pulmonary/Chest:  Breath sounds are clear anteriorly but he does have diminished breath sounds in the bases.  Abdominal: Soft.  Neurological: He is alert and oriented to person, place, and time.  Skin: No rash noted.  Phlebitis on the dorsum of both hands is improving.  Psychiatric: Mood and affect normal.    Lab Results Lab Results  Component Value Date   WBC 21.6* 12/14/2015   HGB 9.2* 12/14/2015   HCT 29.8* 12/14/2015   MCV 80.3 12/14/2015   PLT 682* 12/14/2015    Lab Results  Component Value Date   CREATININE 0.96 12/14/2015   BUN 49* 12/14/2015   NA 133* 12/14/2015   K 4.6 12/14/2015   CL 102 12/14/2015   CO2 22 12/14/2015    Lab Results  Component Value Date   ALT 10* 12/06/2015   AST 15 12/06/2015   ALKPHOS 102 12/06/2015   BILITOT 0.4 12/06/2015     Microbiology: Recent Results (from the past 240 hour(s))  Culture, blood (routine x 2)     Status: None   Collection Time: 12/06/15  8:07 PM  Result Value Ref Range Status   Specimen Description BLOOD  RIGHT ANTECUBITAL  Final   Special Requests BOTTLES DRAWN AEROBIC AND ANAEROBIC 5CC  Final   Culture NO GROWTH 5 DAYS  Final   Report Status 12/11/2015 FINAL  Final  Culture, blood (routine x 2)     Status: None   Collection Time: 12/06/15  8:16 PM  Result Value Ref Range Status  Specimen Description BLOOD LEFT HAND  Final   Special Requests IN PEDIATRIC BOTTLE 4CC  Final   Culture NO GROWTH 5 DAYS  Final   Report Status 12/11/2015 FINAL  Final  Urine culture     Status: None   Collection Time: 12/06/15 11:03 PM  Result Value Ref Range Status   Specimen Description URINE, CATHETERIZED  Final   Special Requests NONE  Final   Culture MULTIPLE SPECIES PRESENT, SUGGEST RECOLLECTION  Final   Report Status 12/08/2015 FINAL  Final  Culture, blood (Routine X 2) w Reflex to ID Panel     Status: None   Collection Time: 12/07/15  3:07 AM  Result Value Ref Range Status   Specimen Description BLOOD RIGHT ARM  Final   Special Requests BOTTLES DRAWN AEROBIC AND ANAEROBIC 5ML  Final   Culture NO GROWTH 5 DAYS  Final   Report Status 12/12/2015 FINAL  Final  Culture, blood (Routine X 2) w Reflex to ID Panel     Status: None   Collection Time: 12/07/15  3:15 AM  Result Value Ref Range Status   Specimen Description BLOOD RIGHT HAND  Final   Special Requests IN PEDIATRIC BOTTLE 4ML  Final   Culture NO GROWTH 5 DAYS  Final   Report Status 12/12/2015 FINAL  Final  MRSA PCR Screening     Status: Abnormal   Collection Time: 12/07/15  6:30 PM  Result Value Ref Range Status   MRSA by PCR POSITIVE (A) NEGATIVE Final    Comment:        The GeneXpert MRSA Assay (FDA approved for NASAL specimens only), is one component of a comprehensive MRSA colonization surveillance program. It is not intended to diagnose MRSA infection nor to guide or monitor treatment for MRSA infections. RESULT CALLED TO, READ BACK BY AND VERIFIED WITH: K.GARCIA,RN @2131  BY L.PITT 12/07/15   Culture, respiratory  (NON-Expectorated)     Status: None (Preliminary result)   Collection Time: 12/12/15 11:17 AM  Result Value Ref Range Status   Specimen Description TRACHEAL ASPIRATE  Final   Special Requests Normal  Final   Gram Stain   Final    ABUNDANT WBC PRESENT, PREDOMINANTLY PMN FEW SQUAMOUS EPITHELIAL CELLS PRESENT MODERATE GRAM POSITIVE COCCI IN PAIRS FEW GRAM NEGATIVE RODS Performed at [facility]    Culture   Final    Culture reincubated for better growth Performed at [facility]    Report Status PENDING  Incomplete  Culture, blood (routine x 2)     Status: None (Preliminary result)   Collection Time: 12/12/15  1:05 PM  Result Value Ref Range Status   Specimen Description BLOOD LEFT HAND  Final   Special Requests IN PEDIATRIC BOTTLE  1 ML  Final   Culture NO GROWTH < 24 HOURS  Final   Report Status PENDING  Incomplete  Culture, blood (routine x 2)     Status: None (Preliminary result)   Collection Time: 12/12/15  7:20 PM  Result Value Ref Range Status   Specimen Description BLOOD LEFT HAND  Final   Special Requests IN PEDIATRIC BOTTLE 2.5CC  Final   Culture NO GROWTH < 24 HOURS  Final   Report Status PENDING  Incomplete  Culture, Urine     Status: None   Collection Time: 12/13/15  6:27 AM  Result Value Ref Range Status   Specimen Description URINE, CATHETERIZED  Final   Special Requests Normal  Final   Culture >=100,000 COLONIES/mL YEAST  Final   Report Status 12/14/2015 FINAL  Final     ASSESSMENT: The cause of his recent high fevers remains unclear. He has defervesced on his current antibiotic regimen. I do not believe that the yeast in his urine culture is unlikely to be a significant pathogen and I will not start antifungal therapy at this time. Repeat blood and sputum cultures are pending.  PLAN: 1. Continue current antibiotics pending further observation and final cultures  Michel Bickers, MD [facility] [phone] pager   [phone] cell 12/14/2015, 2:22 PM

## 2015-12-14 NOTE — Telephone Encounter (Signed)
Can we refill this? 

## 2015-12-15 ENCOUNTER — Inpatient Hospital Stay (HOSPITAL_COMMUNITY): Payer: Medicaid Other

## 2015-12-15 DIAGNOSIS — J918 Pleural effusion in other conditions classified elsewhere: Secondary | ICD-10-CM

## 2015-12-15 LAB — GLUCOSE, CAPILLARY
GLUCOSE-CAPILLARY: 138 mg/dL — AB (ref 65–99)
GLUCOSE-CAPILLARY: 89 mg/dL (ref 65–99)
Glucose-Capillary: 110 mg/dL — ABNORMAL HIGH (ref 65–99)
Glucose-Capillary: 219 mg/dL — ABNORMAL HIGH (ref 65–99)
Glucose-Capillary: 231 mg/dL — ABNORMAL HIGH (ref 65–99)
Glucose-Capillary: 76 mg/dL (ref 65–99)

## 2015-12-15 LAB — CBC
HCT: 27.9 % — ABNORMAL LOW (ref 39.0–52.0)
Hemoglobin: 8.5 g/dL — ABNORMAL LOW (ref 13.0–17.0)
MCH: 24.3 pg — ABNORMAL LOW (ref 26.0–34.0)
MCHC: 30.5 g/dL (ref 30.0–36.0)
MCV: 79.7 fL (ref 78.0–100.0)
Platelets: 619 10*3/uL — ABNORMAL HIGH (ref 150–400)
RBC: 3.5 MIL/uL — ABNORMAL LOW (ref 4.22–5.81)
RDW: 15.6 % — ABNORMAL HIGH (ref 11.5–15.5)
WBC: 18.6 10*3/uL — ABNORMAL HIGH (ref 4.0–10.5)

## 2015-12-15 LAB — BASIC METABOLIC PANEL
ANION GAP: 10 (ref 5–15)
BUN: 46 mg/dL — ABNORMAL HIGH (ref 6–20)
CALCIUM: 9.8 mg/dL (ref 8.9–10.3)
CO2: 22 mmol/L (ref 22–32)
CREATININE: 0.89 mg/dL (ref 0.61–1.24)
Chloride: 101 mmol/L (ref 101–111)
Glucose, Bld: 137 mg/dL — ABNORMAL HIGH (ref 65–99)
Potassium: 5 mmol/L (ref 3.5–5.1)
SODIUM: 133 mmol/L — AB (ref 135–145)

## 2015-12-15 MED ORDER — OXYCODONE HCL 5 MG PO TABS
20.0000 mg | ORAL_TABLET | Freq: Four times a day (QID) | ORAL | Status: DC | PRN
  Administered 2015-12-15 – 2015-12-21 (×14): 20 mg via ORAL
  Filled 2015-12-15 (×15): qty 4

## 2015-12-15 MED ORDER — FLUCONAZOLE 100 MG PO TABS
100.0000 mg | ORAL_TABLET | Freq: Every day | ORAL | Status: DC
Start: 2015-12-15 — End: 2015-12-28
  Administered 2015-12-15 – 2015-12-28 (×14): 100 mg via ORAL
  Filled 2015-12-15 (×27): qty 1

## 2015-12-15 MED ORDER — SODIUM CHLORIDE 0.9 % IV SOLN
5.0000 mg/kg/d | Freq: Two times a day (BID) | INTRAVENOUS | Status: DC
  Administered 2015-12-15 – 2015-12-18 (×8): 137.25 mg via INTRAVENOUS
  Filled 2015-12-15 (×10): qty 1.83

## 2015-12-15 MED ORDER — INSULIN GLARGINE 100 UNIT/ML ~~LOC~~ SOLN
15.0000 [IU] | Freq: Every day | SUBCUTANEOUS | Status: DC
  Administered 2015-12-15 – 2015-12-18 (×4): 15 [IU] via SUBCUTANEOUS
  Filled 2015-12-15 (×4): qty 0.15

## 2015-12-15 NOTE — Progress Notes (Signed)
PULMONARY / CRITICAL CARE MEDICINE   Name: ALA DESAUTEL MRN: [id number] DOB: 08-31-1986    ADMISSION DATE:  12/06/2015 CONSULTATION DATE:  12/06/15  REFERRING MD:  EDP  CHIEF COMPLAINT:  Decrease in O2 sats, fever, PNA  SUBJECTIVE:  Tmax 101.3 overnight, patient alert and oriented resting comfortably in bed   VITAL SIGNS: BP 95/71 mmHg  Pulse 78  Temp(Src) 99.7 F (37.6 C) (Oral)  Resp 24  Ht 5\' 8"  (1.727 m)  Wt 121 lb (54.885 kg)  BMI 18.40 kg/m2  SpO2 100%  HEMODYNAMICS:    VENTILATOR SETTINGS: Vent Mode:  [-]  FiO2 (%):  [5 %-28 %] 5 %  INTAKE / OUTPUT: I/O last 3 completed shifts: In: 1712.9 [P.O.:660; NG/GT:752.9; IV Piggyback:300] Out: [id number] [Urine:2400; Drains:300; [id number]   PHYSICAL EXAMINATION: General: Chronically ill appearing, awake, interactive Neuro: Awake, communicative  HEENT: PERRL, Clean trach site. Cardiovascular: RRR, no MRG, no edema Lungs: Clear, no wheeze or crackles, no use of accessory muscles. Abdomen: PEG in place, + BS, ostomy Musculoskeletal: Muscle wasting bilaterally  Skin: No rashes. Sacral wound vac in place.   LABS:  BMET  Recent Labs Lab 12/13/15 0558 12/14/15 1116 12/15/15 0256  NA 132* 133* 133*  K 4.4 4.6 5.0  CL 101 102 101  CO2 23 22 22   BUN 31* 49* 46*  CREATININE 1.00 0.96 0.89  GLUCOSE 192* 156* 137*    Electrolytes  Recent Labs Lab 12/13/15 0558 12/14/15 1116 12/15/15 0256  CALCIUM 10.2 9.9 9.8    CBC  Recent Labs Lab 12/13/15 0558 12/14/15 1116 12/15/15 0256  WBC 24.9* 21.6* 18.6*  HGB 8.5* 9.2* 8.5*  HCT 27.1* 29.8* 27.9*  PLT 610* 682* 619*    Coag's No results for input(s): APTT, INR in the last 168 hours.  Sepsis Markers No results for input(s): LATICACIDVEN, PROCALCITON, O2SATVEN in the last 168 hours.  ABG No results for input(s): PHART, PCO2ART, PO2ART in the last 168 hours.  Liver Enzymes No results for input(s): AST, ALT, ALKPHOS, BILITOT, ALBUMIN in the  last 168 hours.  Cardiac Enzymes No results for input(s): TROPONINI, PROBNP in the last 168 hours.  Glucose  Recent Labs Lab 12/14/15 1241 12/14/15 1634 12/14/15 2058 12/15/15 0005 12/15/15 0314 12/15/15 0733  GLUCAP 163* 116* 95 89 138* 110*    Imaging No results found.   STUDIES:  CXR 02/28 > bibasilar airspace densities concerning for PNA. CT abdo/ pelvis>>>no fistula CXR 3/7 >>mildly increased bibasilar opacities are noted most consistent with subsegmental atelectasis and associated pleural effusions.   CULTURES: Blood 02/28 >>No growth  Urine 02/28 >>Multiple species, suggest recollection Blood 3/1>> No growth  Sputum 3/6 >>> few pseudomonas >>> Blood Culture 3/6 >> Urine Culture 3/7 >>Yeast   MRSA pos screen  ANTIBIOTICS: Vanc 02/28 (7/7)>> 3/7 Levaquin 02/28 (7/7) >>3/7 Tigecycline 3/7 >> Aztreonam 3/7 >>  SIGNIFICANT EVENTS: 2/28  Admitted with probable HCAP 3/2- no fevers, low secretions, remains vented, refusing Chest PT and other treatments 3/3 improved to TC, pcxr better 3/5- trach collar 24  Hrs 3/6- Tmax 103.2 3/8 still spiking temps   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 HCAP P:   Continue abx Tolerating trach collar.  PMV trials Chest PT Mobilize. Repeat CXR   CARDIOVASCULAR A:  Chronic hypotension - on midodrine. P:  Continue midodrine  RENAL A:   Hyponatremia - chronic NONAG acidosis  resolving 3/4 P:   Trend BMP  Stable  GASTROINTESTINAL A:   GERD, gastroparesis Nutrition R/o chronic fistula - NEG CT 3/4 P:   Pantoprazole TF's at goal  HEMATOLOGIC A:   Anemia - chronic, some dilution now Thrombocytosis - chronic VTE Prophylaxis P:  Transfuse for Hgb < 7 SCD's / heparin  INFECTIOUS A:   UTI - hx pseudomonal UTI in past with resistance to multiple abx - positive  for yeast  Probable HCAP vs tracheitis Hx sacral wounds Some PCT rise about the same clinically P:   ID following  Continue Tigecycline and Aztreonam; await pending sputum culture; ? Inhaled toby?? Defer to ID. Also will need to decide on length of therapy. Will give ID a call; will likely need PICC.  WOC following  Trend fever and WBC curve   ENDOCRINE A:   DM Hyperglycemia  Adrenal insuff likely P:   SSI D/C novolog 2 u 6x per day  Lantus 15 units daily Low Cortisol noted- but no need for additional steroids now.   NEUROLOGIC A:   Hx seizures (not on meds), paraplegia, fibromyalgia, diabetic neuropathy P:   D/C Fentanyl Add 20 mg Oxycodone prn q 6 hours for pain  Home bupropion, lorazepam, & lyrica   Family updated: Mother at bedside and updated 3/9   Erick Colace ACNP-BC [place] Pager # [phone] OR # [phone] if no answer  Attending note: I have seen and examined the patient with nurse practitioner/resident and agree with the note. History, labs and imaging reviewed.  30 year old with chronic respiratory failure, tracheostomy secondary to C6 injury. Brought to [facility] yesterday for reduction in O2 sats with a past 1 week, new HCAP. Tolerating trach collar  Assessment: Sepsis from pneumonia, UTI History of resistant organisms.   - Continue trach collar, PMV trials - PICC line placement, switch to colistin per ID  Rest of plan as above.  Marshell Garfinkel MD [facility] Pager [phone] If no answer or after 3pm call: [phone] 12/15/2015, 2:17 PM

## 2015-12-15 NOTE — Progress Notes (Signed)
Nutrition Follow-up  DOCUMENTATION CODES:   Not applicable  INTERVENTION:  -Continue Glucerna 1.2 @ 67mL/hr -Tubefeeding regimen provides 1900 calories, 95g protein, 1150cc free water -Carb modified diet  NUTRITION DIAGNOSIS:   Inadequate oral intake related to poor appetite as evidenced by per patient/family report.  New diagnosis   GOAL:   Patient will meet greater than or equal to 90% of their needs  Meeting w/ TF + PO  MONITOR:   PO intake, Vent status, Labs, TF tolerance, I & O's  REASON FOR ASSESSMENT:   Consult Enteral/tube feeding initiation and management  ASSESSMENT:   Jason [name] is a 30 y.o. 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 He is followed in trach clinic and he has been doing well and has not required any ventilator support for quite some time, last time roughly November 2016. He had been able to use PMV 24 hours a day without problem.  Jason [name] is no longer on the vent, utilizing trach collar, undergoing PMSV trials as well. He has had bouts of hypoglycemia during his stay. Tubefeed was changed from Nocturnal to Continuous to prevent this. He also has been spiking fevers r/t HCAP & UTI with sepsis. T Max has been as high as 103.2  He has a history of resistant organisms. Estimated needs recalculated for pt off vent. No issues tolerating tubefeed, no n/v. PO intake 25% for past two meals. Mom states pt hasn't been very hungry.  Labs - unremarkable Medications reviewed.   Diet Order:  Diet Carb Modified Fluid consistency:: Thin; Room service appropriate?: Yes  Skin:     2 Stg IVs, new small wound on R Buttocks.  Last BM:  3/1  Height:   Ht Readings from Last 1 Encounters:  12/07/15 5\' 8"  (1.727 m)    Weight:   Wt Readings from Last 1 Encounters:  12/15/15 121 lb (54.885 kg)    Ideal Body Weight:  70 kg  BMI:  Body mass index is 18.4  kg/(m^2).  Estimated Nutritional Needs:   Kcal:  1650-1950 calories  Protein:  90-110 grams  Fluid:  >/= 1.7L  EDUCATION NEEDS:   No education needs identified at this time  Satira Anis. Melvia Matousek, MS, RD LDN After Hours/Weekend Pager [phone]

## 2015-12-15 NOTE — Progress Notes (Signed)
Patient ID: Jason [name], male   DOB: [birth year], 30 y.o.   MRN: [id number] Patient ID: Jason [name], male   DOB: 12-Sep-1986, 30 y.o.   MRN: [id number]         [place] for Infectious Disease    Date of Admission:  12/06/2015   Total days of antibiotics 9        Day 3 aztreonam        Day 3 tigecycline         Principal Problem:   HCAP (healthcare-associated pneumonia) Active Problems:   Acute on chronic respiratory failure ([name])   Recurrent UTI   Pressure ulcer   Neurogenic bladder   Respiratory failure, chronic ([name])   Paraplegia ([name])   Acute respiratory failure ([name])   Pneumonia   Fistula   Pyrexia   . antiseptic oral rinse  7 mL Mouth Rinse QID  . aztreonam  2 g Intravenous 3 times per day  . baclofen  20 mg Oral Q6H  . buPROPion  150 mg Oral Daily  . chlorhexidine gluconate  15 mL Mouth Rinse BID  . fentaNYL  50 mcg Transdermal Q72H  . heparin  5,000 Units Subcutaneous 3 times per day  . insulin aspart  0-9 Units Subcutaneous 6 times per day  . insulin glargine  15 Units Subcutaneous Daily  . midodrine  20 mg Per Tube TID WC  . pantoprazole sodium  40 mg Per Tube Daily  . pregabalin  100 mg Per Tube TID  . tigecycline (TYGACIL) IVPB  50 mg Intravenous Q12H    SUBJECTIVE: He is feeling better. He denies any further problem with cough, sputum production or shortness of breath. His mother states that it is not unusual for him to have temperatures up to 101 at night.  Review of Systems: Review of Systems  Constitutional: Negative for fever, chills, weight loss, malaise/fatigue and diaphoresis.  HENT: Negative for sore throat.   Respiratory: Negative for cough, sputum production and shortness of breath.   Cardiovascular: Negative for chest pain.  Gastrointestinal: Negative for nausea, vomiting and diarrhea.  Musculoskeletal: Negative for myalgias and joint pain.  Skin: Negative for rash.  Neurological: Positive for focal weakness. Negative for  headaches.  Psychiatric/Behavioral: Negative for depression. The patient is not nervous/anxious.     Past Medical History  Diagnosis Date  . GERD (gastroesophageal reflux disease)   . Asthma   . Hx MRSA infection     on face  . Gastroparesis   . Diabetic neuropathy ([place])   . Seizures ([place])   . Family history of anesthesia complication     Pt mother can't have epidural procedures  . Dysrhythmia   . Pneumonia   . Arthritis   . Fibromyalgia   . 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 Type I diabetes mellitus ([place])     sees Dr. Loanne Drilling   . Syncope 02/16/2015  . Multiple allergies 11/02/2015  . Recurrent UTI 11/02/2015    Social History  Substance Use Topics  . Smoking status: Former Smoker -- 1.00 packs/day for 12 years    Types: Cigars, Cigarettes    Quit date: 09/07/2014  . Smokeless tobacco: Never Used  . Alcohol Use: No    Family History  Problem Relation Age of Onset  . Diabetes Father   . Hypertension Father   . Asthma      fhx  . Hypertension      fhx  .  Stroke      fhx  . Heart disease Mother    Allergies  Allergen Reactions  . Cefuroxime Axetil Anaphylaxis  . Ertapenem Other (See Comments)    Rash and confusion-->tolerated Imipenem   . Morphine And Related Other (See Comments)    Changed mental status, confusion, headache, visual hallucination  . Penicillins Anaphylaxis and Other (See Comments)    Tolerated Imipenem; no reaction to 7 day course of amoxicillin in 2015 Has patient had a PCN reaction causing immediate rash, facial/tongue/throat swelling, SOB or lightheadedness with hypotension: Yes Has patient had a PCN reaction causing severe rash involving mucus membranes or skin necrosis: No Has patient had a PCN reaction that required hospitalization Yes Has patient had a PCN reaction occurring within the last 10 years: No If all of the above answers are "NO", then may proceed with Cephalo  . Sulfa Antibiotics Anaphylaxis,  Shortness Of Breath and Other (See Comments)  . Tessalon [Benzonatate] Anaphylaxis  . Shellfish Allergy Itching and Other (See Comments)    Took benadryl to alleviate reaction  . Levaquin [Levofloxacin In D5w] Swelling    Hand and forearm swelling  . Nsaids Other (See Comments)    Risk of bleeding  . Miripirium Rash and Other (See Comments)    Change in mental status    OBJECTIVE: Filed Vitals:   12/15/15 0400 12/15/15 0500 12/15/15 0550 12/15/15 0736  BP: 100/70   95/71  Pulse:    78  Temp:   99.6 F (37.6 C) 99.7 F (37.6 C)  TempSrc:   Oral Oral  Resp: 17   24  Height:      Weight:  121 lb (54.885 kg)    SpO2: 100%   100%   Body mass index is 18.4 kg/(m^2).  Physical Exam  Constitutional: He is oriented to person, place, and time.  He is alert and in no distress. He appears quite comfortable. He is watching television.  HENT:  Mouth/Throat: No oropharyngeal exudate.  Eyes: Conjunctivae are normal.  Cardiovascular: Normal rate.   No murmur heard. Pulmonary/Chest: Breath sounds normal.  Diminished breath sounds in bases.  Abdominal: Soft.  Neurological: He is alert and oriented to person, place, and time.  Skin: No rash noted.  Phlebitis on the dorsum of both hands is improving.  Psychiatric: Mood and affect normal.    Lab Results Lab Results  Component Value Date   WBC 18.6* 12/15/2015   HGB 8.5* 12/15/2015   HCT 27.9* 12/15/2015   MCV 79.7 12/15/2015   PLT 619* 12/15/2015    Lab Results  Component Value Date   CREATININE 0.89 12/15/2015   BUN 46* 12/15/2015   NA 133* 12/15/2015   K 5.0 12/15/2015   CL 101 12/15/2015   CO2 22 12/15/2015    Lab Results  Component Value Date   ALT 10* 12/06/2015   AST 15 12/06/2015   ALKPHOS 102 12/06/2015   BILITOT 0.4 12/06/2015     Microbiology: Recent Results (from the past 240 hour(s))  Culture, blood (routine x 2)     Status: None   Collection Time: 12/06/15  8:07 PM  Result Value Ref Range Status    Specimen Description BLOOD RIGHT ANTECUBITAL  Final   Special Requests BOTTLES DRAWN AEROBIC AND ANAEROBIC 5CC  Final   Culture NO GROWTH 5 DAYS  Final   Report Status 12/11/2015 FINAL  Final  Culture, blood (routine x 2)     Status: None   Collection Time: 12/06/15  8:16 PM  Result Value Ref Range Status   Specimen Description BLOOD LEFT HAND  Final   Special Requests IN PEDIATRIC BOTTLE 4CC  Final   Culture NO GROWTH 5 DAYS  Final   Report Status 12/11/2015 FINAL  Final  Urine culture     Status: None   Collection Time: 12/06/15 11:03 PM  Result Value Ref Range Status   Specimen Description URINE, CATHETERIZED  Final   Special Requests NONE  Final   Culture MULTIPLE SPECIES PRESENT, SUGGEST RECOLLECTION  Final   Report Status 12/08/2015 FINAL  Final  Culture, blood (Routine X 2) w Reflex to ID Panel     Status: None   Collection Time: 12/07/15  3:07 AM  Result Value Ref Range Status   Specimen Description BLOOD RIGHT ARM  Final   Special Requests BOTTLES DRAWN AEROBIC AND ANAEROBIC 5ML  Final   Culture NO GROWTH 5 DAYS  Final   Report Status 12/12/2015 FINAL  Final  Culture, blood (Routine X 2) w Reflex to ID Panel     Status: None   Collection Time: 12/07/15  3:15 AM  Result Value Ref Range Status   Specimen Description BLOOD RIGHT HAND  Final   Special Requests IN PEDIATRIC BOTTLE 4ML  Final   Culture NO GROWTH 5 DAYS  Final   Report Status 12/12/2015 FINAL  Final  MRSA PCR Screening     Status: Abnormal   Collection Time: 12/07/15  6:30 PM  Result Value Ref Range Status   MRSA by PCR POSITIVE (A) NEGATIVE Final    Comment:        The GeneXpert MRSA Assay (FDA approved for NASAL specimens only), is one component of a comprehensive MRSA colonization surveillance program. It is not intended to diagnose MRSA infection nor to guide or monitor treatment for MRSA infections. RESULT CALLED TO, READ BACK BY AND VERIFIED WITH: K.GARCIA,RN @2131  BY L.PITT 12/07/15   Culture,  respiratory (NON-Expectorated)     Status: None (Preliminary result)   Collection Time: 12/12/15 11:17 AM  Result Value Ref Range Status   Specimen Description TRACHEAL ASPIRATE  Final   Special Requests Normal  Final   Gram Stain   Final    ABUNDANT WBC PRESENT, PREDOMINANTLY PMN FEW SQUAMOUS EPITHELIAL CELLS PRESENT MODERATE GRAM POSITIVE COCCI IN PAIRS FEW GRAM NEGATIVE RODS Performed at [facility]    Culture   Final    FEW PSEUDOMONAS AERUGINOSA Performed at [facility]    Report Status PENDING  Incomplete  Culture, blood (routine x 2)     Status: None (Preliminary result)   Collection Time: 12/12/15  1:05 PM  Result Value Ref Range Status   Specimen Description BLOOD LEFT HAND  Final   Special Requests IN PEDIATRIC BOTTLE  1 ML  Final   Culture NO GROWTH 2 DAYS  Final   Report Status PENDING  Incomplete  Culture, blood (routine x 2)     Status: None (Preliminary result)   Collection Time: 12/12/15  7:20 PM  Result Value Ref Range Status   Specimen Description BLOOD LEFT HAND  Final   Special Requests IN PEDIATRIC BOTTLE 2.5CC  Final   Culture NO GROWTH 2 DAYS  Final   Report Status PENDING  Incomplete  Culture, Urine     Status: None   Collection Time: 12/13/15  6:27 AM  Result Value Ref Range Status   Specimen Description URINE, CATHETERIZED  Final   Special Requests Normal  Final  Culture >=100,000 COLONIES/mL YEAST  Final   Report Status 12/14/2015 FINAL  Final     ASSESSMENT: Overall he is looking better but he continues to have fever. The cause of the fever is not certain but he is growing Pseudomonas in his sputum and his chest x-ray continues to show small bilateral effusions with associated atelectasis versus infiltrate. He also has yeast in his urine which is a less likely cause of fever. Recent blood cultures are negative and his pressure wounds have been looking better without evidence of infection. I talked to [facility] and his mother at  length this morning about management options including stopping all antibiotics and observing him closely. At the other extreme week to treat him with IV colistin for the probability that this Pseudomonas is similar to the multidrug resistant Pseudomonas grew from urine last fall. Unfortunately he has a history of anaphylaxis with cephalosporins so they are not an option. After much discussion we decided to change his antibiotic therapy to IV colistin and oral fluconazole and have a PICC placed.  PLAN: 1. PICC placement 2. Discontinue tigecycline and aztreonam 3. Start IV colistin and oral fluconazole   Michel Bickers, MD [facility] for [place] [phone] pager   [phone] cell 12/15/2015, 11:53 AM

## 2015-12-16 LAB — GLUCOSE, CAPILLARY
GLUCOSE-CAPILLARY: 142 mg/dL — AB (ref 65–99)
GLUCOSE-CAPILLARY: 70 mg/dL (ref 65–99)
Glucose-Capillary: 105 mg/dL — ABNORMAL HIGH (ref 65–99)
Glucose-Capillary: 139 mg/dL — ABNORMAL HIGH (ref 65–99)
Glucose-Capillary: 63 mg/dL — ABNORMAL LOW (ref 65–99)
Glucose-Capillary: 109 mg/dL — ABNORMAL HIGH (ref 65–99)
Glucose-Capillary: 53 mg/dL — ABNORMAL LOW (ref 65–99)

## 2015-12-16 MED ORDER — SODIUM CHLORIDE 0.9% FLUSH: 10.0000 mL | INTRAVENOUS | Status: DC | PRN

## 2015-12-16 MED ORDER — SODIUM CHLORIDE 0.9% FLUSH
10.0000 mL | Freq: Two times a day (BID) | INTRAVENOUS | Status: DC
  Administered 2015-12-16 – 2015-12-28 (×15): 10 mL

## 2015-12-16 NOTE — Consult Note (Signed)
WOC wound follow up:  Vac maintained a seal since Tues. Re-applied 3 Vac dressings leaving the rectum area free from dressings, in hopes that suction remain in place if another stool occurs. 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 Large amt pink drainage in the cannister. Pressure Ulcer POA: Yes x 3 Dressing procedure/placement/frequency: Applied Vac dressing to left and right iscium and sacrum wounds; one piece black foam to each ischium wound and 2 in the sacrum, bridged together, then bridged to hip to reduce pressure from the track pad.Applied hydrocolloid around wound edges to maintain seal and a barrier ring over the top to protect from stool. Rectum area is free from dressings. Pt tolerated without pain. Attached Y connector to one machine with cont suction on at 149mm. Plan for dressing change on Mon. If stooling causes Vac to loose seal, then orders provided for the staff nurse to apply moist gauze dressings until [place] can re-apply the Vac dressings. He can resume use of his own negative pressure device after discharge. One hour spent performing this complex Vac dressing.  Ostomy pouch changed, stoma red and viable, stool tan and liquid was too thick to go through the previous urostomy pouch which was applied yesterday.  Extra supplies left at the bedside for staff nurse use. Julien Girt MSN, RN, [place], [place], [place]

## 2015-12-16 NOTE — Progress Notes (Signed)
12/16/2015 patient temp was 101.5 orally at [place]. Dr Megan Salon (infectious Disease) was made aware. Dr Vaughan Browner (Critical Care) was also made aware patient is running a temp. [facility] RN.

## 2015-12-16 NOTE — Progress Notes (Signed)
Peripherally Inserted Central Catheter/Midline Placement  The IV Nurse has discussed with the patient and/or persons authorized to consent for the patient, the purpose of this procedure and the potential benefits and risks involved with this procedure.  The benefits include less needle sticks, lab draws from the catheter and patient may be discharged home with the catheter.  Risks include, but not limited to, infection, bleeding, blood clot (thrombus formation), and puncture of an artery; nerve damage and irregular heat beat.  Alternatives to this procedure were also discussed.  PICC/Midline Placement Documentation  PICC / Midline Single Lumen 08/26/15 PICC Right Brachial 36 cm 0 cm (Active)    Mother at bedside   Darlyn Read 12/16/2015, 4:40 PM

## 2015-12-16 NOTE — Progress Notes (Signed)
Patient ID: Jason [name], male   DOB: [birth year], 30 y.o.   MRN: [id number] Patient ID: Jason [name], male   DOB: 1985/10/12, 30 y.o.   MRN: [id number]         [facility]    Date of Admission:  12/06/2015   Total days of antibiotics 10        Day 2 colistin        Day 2 fluconazole         Principal Problem:   HCAP (healthcare-associated pneumonia) Active Problems:   Acute on chronic respiratory failure ([name])   Recurrent UTI   Pressure ulcer   Neurogenic bladder   Respiratory failure, chronic ([name])   Paraplegia ([name])   Acute respiratory failure ([name])   Pneumonia   Fistula   Pyrexia   . antiseptic oral rinse  7 mL Mouth Rinse QID  . baclofen  20 mg Oral Q6H  . buPROPion  150 mg Oral Daily  . chlorhexidine gluconate  15 mL Mouth Rinse BID  . colistimethate (COLISTIN) IV  5 mg/kg/day Intravenous Q12H  . fentaNYL  50 mcg Transdermal Q72H  . fluconazole  100 mg Oral Daily  . heparin  5,000 Units Subcutaneous 3 times per day  . insulin aspart  0-9 Units Subcutaneous 6 times per day  . insulin glargine  15 Units Subcutaneous Daily  . midodrine  20 mg Per Tube TID WC  . pantoprazole sodium  40 mg Per Tube Daily  . pregabalin  100 mg Per Tube TID    SUBJECTIVE: He feels better but remains febrile. He denies any cough or shortness of breath.  Review of Systems: Review of Systems  Constitutional: Negative for fever, chills, weight loss, malaise/fatigue and diaphoresis.  HENT: Negative for sore throat.   Respiratory: Negative for cough, sputum production and shortness of breath.   Cardiovascular: Negative for chest pain.  Gastrointestinal: Negative for nausea, vomiting and diarrhea.  Musculoskeletal: Negative for myalgias and joint pain.  Skin: Negative for rash.  Neurological: Positive for focal weakness. Negative for headaches.  Psychiatric/Behavioral: Negative for depression. The patient is not nervous/anxious.     Past Medical  History  Diagnosis Date  . GERD (gastroesophageal reflux disease)   . Asthma   . Hx MRSA infection     on face  . Gastroparesis   . Diabetic neuropathy ([place])   . Seizures ([place])   . Family history of anesthesia complication     Pt mother can't have epidural procedures  . Dysrhythmia   . Pneumonia   . Arthritis   . Fibromyalgia   . 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 Type I diabetes mellitus ([place])     sees Dr. Loanne Drilling   . Syncope 02/16/2015  . Multiple allergies 11/02/2015  . Recurrent UTI 11/02/2015    Social History  Substance Use Topics  . Smoking status: Former Smoker -- 1.00 packs/day for 12 years    Types: Cigars, Cigarettes    Quit date: 09/07/2014  . Smokeless tobacco: Never Used  . Alcohol Use: No    Family History  Problem Relation Age of Onset  . Diabetes Father   . Hypertension Father   . Asthma      fhx  . Hypertension      fhx  . Stroke      fhx  . Heart disease Mother    Allergies  Allergen Reactions  . Cefuroxime Axetil Anaphylaxis  .  Ertapenem Other (See Comments)    Rash and confusion-->tolerated Imipenem   . Morphine And Related Other (See Comments)    Changed mental status, confusion, headache, visual hallucination  . Penicillins Anaphylaxis and Other (See Comments)    Tolerated Imipenem; no reaction to 7 day course of amoxicillin in 2015 Has patient had a PCN reaction causing immediate rash, facial/tongue/throat swelling, SOB or lightheadedness with hypotension: Yes Has patient had a PCN reaction causing severe rash involving mucus membranes or skin necrosis: No Has patient had a PCN reaction that required hospitalization Yes Has patient had a PCN reaction occurring within the last 10 years: No If all of the above answers are "NO", then may proceed with Cephalo  . Sulfa Antibiotics Anaphylaxis, Shortness Of Breath and Other (See Comments)  . Tessalon [Benzonatate] Anaphylaxis  . Shellfish Allergy Itching and  Other (See Comments)    Took benadryl to alleviate reaction  . Levaquin [Levofloxacin In D5w] Swelling    Hand and forearm swelling  . Nsaids Other (See Comments)    Risk of bleeding  . Miripirium Rash and Other (See Comments)    Change in mental status    OBJECTIVE: Filed Vitals:   12/16/15 0545 12/16/15 0600 12/16/15 0747 12/16/15 0801  BP:  83/58 94/73 82/67   Pulse: 95  110 87  Temp:    101 F (38.3 C)  TempSrc:    Oral  Resp:   20 16  Height:      Weight:      SpO2:   99% 98%   Body mass index is 18.4 kg/(m^2).  Physical Exam  Constitutional: He is oriented to person, place, and time.  He is alert and in no distress.   HENT:  Mouth/Throat: No oropharyngeal exudate.  Eyes: Conjunctivae are normal.  Cardiovascular: Normal rate.   No murmur heard. Pulmonary/Chest: Breath sounds normal.  Diminished breath sounds in bases.  Abdominal: Soft. There is no tenderness.  Colostomy appears normal. PEG site and suprapubic sites are normal.  Neurological: He is alert and oriented to person, place, and time.  Skin: No rash noted.  Sacral wounds inspected with Julien Girt, wound care nurse,. They look clean without evidence of active infection.  Psychiatric: Mood and affect normal.    Lab Results Lab Results  Component Value Date   WBC 18.6* 12/15/2015   HGB 8.5* 12/15/2015   HCT 27.9* 12/15/2015   MCV 79.7 12/15/2015   PLT 619* 12/15/2015    Lab Results  Component Value Date   CREATININE 0.89 12/15/2015   BUN 46* 12/15/2015   NA 133* 12/15/2015   K 5.0 12/15/2015   CL 101 12/15/2015   CO2 22 12/15/2015    Lab Results  Component Value Date   ALT 10* 12/06/2015   AST 15 12/06/2015   ALKPHOS 102 12/06/2015   BILITOT 0.4 12/06/2015     Microbiology: Recent Results (from the past 240 hour(s))  Culture, blood (routine x 2)     Status: None   Collection Time: 12/06/15  8:07 PM  Result Value Ref Range Status   Specimen Description BLOOD RIGHT ANTECUBITAL  Final     Special Requests BOTTLES DRAWN AEROBIC AND ANAEROBIC 5CC  Final   Culture NO GROWTH 5 DAYS  Final   Report Status 12/11/2015 FINAL  Final  Culture, blood (routine x 2)     Status: None   Collection Time: 12/06/15  8:16 PM  Result Value Ref Range Status   Specimen Description BLOOD LEFT HAND  Final   Special Requests IN PEDIATRIC BOTTLE 4CC  Final   Culture NO GROWTH 5 DAYS  Final   Report Status 12/11/2015 FINAL  Final  Urine culture     Status: None   Collection Time: 12/06/15 11:03 PM  Result Value Ref Range Status   Specimen Description URINE, CATHETERIZED  Final   Special Requests NONE  Final   Culture MULTIPLE SPECIES PRESENT, SUGGEST RECOLLECTION  Final   Report Status 12/08/2015 FINAL  Final  Culture, blood (Routine X 2) w Reflex to ID Panel     Status: None   Collection Time: 12/07/15  3:07 AM  Result Value Ref Range Status   Specimen Description BLOOD RIGHT ARM  Final   Special Requests BOTTLES DRAWN AEROBIC AND ANAEROBIC 5ML  Final   Culture NO GROWTH 5 DAYS  Final   Report Status 12/12/2015 FINAL  Final  Culture, blood (Routine X 2) w Reflex to ID Panel     Status: None   Collection Time: 12/07/15  3:15 AM  Result Value Ref Range Status   Specimen Description BLOOD RIGHT HAND  Final   Special Requests IN PEDIATRIC BOTTLE 4ML  Final   Culture NO GROWTH 5 DAYS  Final   Report Status 12/12/2015 FINAL  Final  MRSA PCR Screening     Status: Abnormal   Collection Time: 12/07/15  6:30 PM  Result Value Ref Range Status   MRSA by PCR POSITIVE (A) NEGATIVE Final    Comment:        The GeneXpert MRSA Assay (FDA approved for NASAL specimens only), is one component of a comprehensive MRSA colonization surveillance program. It is not intended to diagnose MRSA infection nor to guide or monitor treatment for MRSA infections. RESULT CALLED TO, READ BACK BY AND VERIFIED WITH: K.GARCIA,RN @2131  BY L.PITT 12/07/15   Culture, respiratory (NON-Expectorated)     Status: None  (Preliminary result)   Collection Time: 12/12/15 11:17 AM  Result Value Ref Range Status   Specimen Description TRACHEAL ASPIRATE  Final   Special Requests Normal  Final   Gram Stain   Final    ABUNDANT WBC PRESENT, PREDOMINANTLY PMN FEW SQUAMOUS EPITHELIAL CELLS PRESENT MODERATE GRAM POSITIVE COCCI IN PAIRS FEW GRAM NEGATIVE RODS Performed at [facility]    Culture   Final    FEW PSEUDOMONAS AERUGINOSA Performed at [facility]    Report Status PENDING  Incomplete  Culture, blood (routine x 2)     Status: None (Preliminary result)   Collection Time: 12/12/15  1:05 PM  Result Value Ref Range Status   Specimen Description BLOOD LEFT HAND  Final   Special Requests IN PEDIATRIC BOTTLE  1 ML  Final   Culture NO GROWTH 3 DAYS  Final   Report Status PENDING  Incomplete  Culture, blood (routine x 2)     Status: None (Preliminary result)   Collection Time: 12/12/15  7:20 PM  Result Value Ref Range Status   Specimen Description BLOOD LEFT HAND  Final   Special Requests IN PEDIATRIC BOTTLE 2.5CC  Final   Culture NO GROWTH 3 DAYS  Final   Report Status PENDING  Incomplete  Culture, Urine     Status: None   Collection Time: 12/13/15  6:27 AM  Result Value Ref Range Status   Specimen Description URINE, CATHETERIZED  Final   Special Requests Normal  Final   Culture >=100,000 COLONIES/mL YEAST  Final   Report Status 12/14/2015 FINAL  Final  ASSESSMENT: It is not entirely clear why he continues to be febrile. I will continue IV colistin pending the antibiotic susceptibility results for the Pseudomonas that has grown from recent sputum culture. I will also continue fluconazole for possible fungal UTI. His PICC should be placed later today.  PLAN: 1. PICC placement 2. Start IV colistin and oral fluconazole 3. Await results of final sputum culture   Michel Bickers, MD [facility] for Infectious [place] [phone] pager   [phone]  cell 12/16/2015, 9:30 AM

## 2015-12-16 NOTE — Progress Notes (Signed)
PULMONARY / CRITICAL CARE MEDICINE   Name: Jason [name] MRN: [id number] DOB: [birth year]    ADMISSION DATE:  12/06/2015 CONSULTATION DATE:  12/06/15  REFERRING MD:  EDP  CHIEF COMPLAINT:  Decrease in O2 sats, fever, PNA  SUBJECTIVE:  Continues to spike fevers.  VITAL SIGNS: BP 112/69 mmHg  Pulse 81  Temp(Src) 99.8 F (37.7 C) (Oral)  Resp 20  Ht 5\' 8"  (1.727 m)  Wt 121 lb (54.885 kg)  BMI 18.40 kg/m2  SpO2 99%  HEMODYNAMICS:    VENTILATOR SETTINGS: Vent Mode:  [-]  FiO2 (%):  [5 %-28 %] 28 %  INTAKE / OUTPUT: I/O last 3 completed shifts: In: 2237.6 [P.O.:660; NG/GT:1075.8; IV Piggyback:501.8] Out: 6700 [Urine:3850; Drains:100; Stool:2750]   PHYSICAL EXAMINATION: General: Chronically ill appearing, awake, interactive Neuro: No focal deficits HEENT: PERRL, Clean trach site. Cardiovascular: RRR, no MRG Lungs: Non laboured breathing. No wheeze or crackles. Abdomen: PEG in place, + BS, ostomy Musculoskeletal: Muscle wasting bilaterally  Skin: No rashes. Sacral wound vac in place.   LABS:  BMET  Recent Labs Lab 12/13/15 0558 12/14/15 1116 12/15/15 0256  NA 132* 133* 133*  K 4.4 4.6 5.0  CL 101 102 101  CO2 23 22 22   BUN 31* 49* 46*  CREATININE 1.00 0.96 0.89  GLUCOSE 192* 156* 137*    Electrolytes  Recent Labs Lab 12/13/15 0558 12/14/15 1116 12/15/15 0256  CALCIUM 10.2 9.9 9.8    CBC  Recent Labs Lab 12/13/15 0558 12/14/15 1116 12/15/15 0256  WBC 24.9* 21.6* 18.6*  HGB 8.5* 9.2* 8.5*  HCT 27.1* 29.8* 27.9*  PLT 610* 682* 619*    Coag's No results for input(s): APTT, INR in the last 168 hours.  Sepsis Markers No results for input(s): LATICACIDVEN, PROCALCITON, O2SATVEN in the last 168 hours.  ABG No results for input(s): PHART, PCO2ART, PO2ART in the last 168 hours.  Liver Enzymes No results for input(s): AST, ALT, ALKPHOS, BILITOT, ALBUMIN in the last 168 hours.  Cardiac Enzymes No results for input(s): TROPONINI,  PROBNP in the last 168 hours.  Glucose  Recent Labs Lab 12/15/15 1232 12/15/15 1640 12/15/15 2006 12/16/15 0007 12/16/15 0323 12/16/15 0754  GLUCAP 219* 231* 76 142* 105* 139*    Imaging No results found.   STUDIES:  CXR 02/28 > bibasilar airspace densities concerning for PNA. CT abdo/ pelvis>>>no fistula CXR 3/7 >>mildly increased bibasilar opacities are noted most consistent with subsegmental atelectasis and associated pleural effusions.   CULTURES: Blood 02/28 >>No growth  Urine 02/28 >>Multiple species, suggest recollection Blood 3/1>> No growth  Sputum 3/6 >>> few pseudomonas >>> Blood Culture 3/6 >> Urine Culture 3/7 >>Yeast   MRSA pos screen  ANTIBIOTICS: Vanc 02/28 (7/7)>> 3/7 Levaquin 02/28 (7/7) >>3/7 Tigecycline 3/7 >> 3/9 Aztreonam 3/7 >> 3/9 Colistin 3/9 >>  SIGNIFICANT EVENTS: 2/28  Admitted with probable HCAP 3/2- no fevers, low secretions, remains vented, refusing Chest PT and other treatments 3/3 improved to TC, pcxr better 3/5- trach collar 24  Hrs 3/6- Tmax 103.2 3/8 still spiking temps   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 HCAP P:   Continue abx Tolerating trach collar.  PMV trials Chest PT Mobilize. Repeat CXR   CARDIOVASCULAR A:  Chronic hypotension - on midodrine. P:  Continue midodrine  RENAL A:   Hyponatremia - chronic NONAG acidosis resolving 3/4 P:   Trend BMP  Stable  GASTROINTESTINAL A:   GERD, gastroparesis Nutrition R/o chronic fistula - NEG CT 3/4 P:   Pantoprazole TF's at goal  HEMATOLOGIC A:   Anemia - chronic, some dilution now Thrombocytosis - chronic VTE Prophylaxis P:  Transfuse for Hgb < 7 SCD's / heparin  INFECTIOUS A:   UTI - hx pseudomonal UTI in past with resistance to multiple abx - positive for yeast  Probable HCAP vs tracheitis Hx  sacral wounds Some PCT rise about the same clinically P:   ID following  PICC line to be placed today. Abx changed to colisitn WOC following  Trend fever and WBC curve   ENDOCRINE A:   DM Hyperglycemia  Adrenal insuff likely P:   SSI  Lantus 15 units daily Low Cortisol noted- but no need for additional steroids now.   NEUROLOGIC A:   Hx seizures (not on meds), paraplegia, fibromyalgia, diabetic neuropathy P:   On 20 mg Oxycodone prn q 6 hours for pain  Home bupropion, lorazepam, & lyrica  Family updated: Mother at bedside and updated 3/10.  Marshell Garfinkel MD [facility] Pager [phone] If no answer or after 3pm call: [phone] 12/16/2015, 12:37 PM

## 2015-12-17 DIAGNOSIS — J9622 Acute and chronic respiratory failure with hypercapnia: Secondary | ICD-10-CM

## 2015-12-17 DIAGNOSIS — J189 Pneumonia, unspecified organism: Secondary | ICD-10-CM | POA: Diagnosis present

## 2015-12-17 DIAGNOSIS — B3749 Other urogenital candidiasis: Secondary | ICD-10-CM

## 2015-12-17 DIAGNOSIS — J9621 Acute and chronic respiratory failure with hypoxia: Secondary | ICD-10-CM

## 2015-12-17 LAB — CBC
HEMATOCRIT: 26.1 % — AB (ref 39.0–52.0)
HEMOGLOBIN: 7.7 g/dL — AB (ref 13.0–17.0)
MCH: 23.9 pg — ABNORMAL LOW (ref 26.0–34.0)
MCHC: 29.5 g/dL — ABNORMAL LOW (ref 30.0–36.0)
MCV: 81.1 fL (ref 78.0–100.0)
Platelets: 501 10*3/uL — ABNORMAL HIGH (ref 150–400)
RBC: 3.22 MIL/uL — AB (ref 4.22–5.81)
RDW: 16 % — ABNORMAL HIGH (ref 11.5–15.5)
WBC: 15.7 10*3/uL — AB (ref 4.0–10.5)

## 2015-12-17 LAB — CULTURE, BLOOD (ROUTINE X 2)
CULTURE: NO GROWTH
Culture: NO GROWTH

## 2015-12-17 LAB — GLUCOSE, CAPILLARY
GLUCOSE-CAPILLARY: 119 mg/dL — AB (ref 65–99)
GLUCOSE-CAPILLARY: 176 mg/dL — AB (ref 65–99)
GLUCOSE-CAPILLARY: 219 mg/dL — AB (ref 65–99)
GLUCOSE-CAPILLARY: 232 mg/dL — AB (ref 65–99)
Glucose-Capillary: 136 mg/dL — ABNORMAL HIGH (ref 65–99)
Glucose-Capillary: 17 mg/dL — CL (ref 65–99)
Glucose-Capillary: 151 mg/dL — ABNORMAL HIGH (ref 65–99)

## 2015-12-17 LAB — BASIC METABOLIC PANEL
ANION GAP: 6 (ref 5–15)
BUN: 31 mg/dL — ABNORMAL HIGH (ref 6–20)
CHLORIDE: 104 mmol/L (ref 101–111)
CO2: 25 mmol/L (ref 22–32)
Calcium: 9.3 mg/dL (ref 8.9–10.3)
Creatinine, Ser: 0.78 mg/dL (ref 0.61–1.24)
GFR calc non Af Amer: >60 mL/min (ref >60)
Glucose, Bld: 131 mg/dL — ABNORMAL HIGH (ref 65–99)
Potassium: 6 mmol/L — ABNORMAL HIGH (ref 3.5–5.1)
SODIUM: 135 mmol/L (ref 135–145)

## 2015-12-17 MED ORDER — DEXTROSE 50 % IV SOLN
INTRAVENOUS | Status: AC
  Administered 2015-12-17: 50 mL
  Filled 2015-12-17: qty 50

## 2015-12-17 NOTE — Progress Notes (Signed)
INFECTIOUS DISEASE PROGRESS NOTE  ID: SIONE MEINHOLZ is a 30 y.o. male with  Principal Problem:   HCAP (healthcare-associated pneumonia) Active Problems:   Neurogenic bladder   Acute on chronic respiratory failure ([name])   Recurrent UTI   Respiratory failure, chronic ([name])   Paraplegia ([name])   Pressure ulcer   Acute respiratory failure ([name])   Pneumonia   Fistula   Pyrexia  Subjective: No diarrhea Mom concerned about continued fever Breathing is abn when awakens but otherwise no problems.  Denies cough.   Abtx:  Anti-infectives    Start     Dose/Rate Route Frequency Ordered Stop   12/15/15 1300  fluconazole (DIFLUCAN) tablet 100 mg     100 mg Oral Daily 12/15/15 1211     12/15/15 1230  colistimethate (COLYMYCIN) 137.25 mg in sodium chloride 0.9 % 100 mL IVPB     5 mg/kg/day  54.9 kg 203.7 mL/hr over 30 Minutes Intravenous Every 12 hours 12/15/15 1211     12/13/15 2200  tigecycline (TYGACIL) 50 mg in sodium chloride 0.9 % 100 mL IVPB  Status:  Discontinued     50 mg 200 mL/hr over 30 Minutes Intravenous Every 12 hours 12/13/15 0856 12/15/15 1211   12/13/15 1000  tigecycline (TYGACIL) 100 mg in sodium chloride 0.9 % 100 mL IVPB     100 mg 200 mL/hr over 30 Minutes Intravenous  Once 12/13/15 0856 12/13/15 1117   12/13/15 1000  aztreonam (AZACTAM) 2 g in dextrose 5 % 50 mL IVPB  Status:  Discontinued     2 g 100 mL/hr over 30 Minutes Intravenous 3 times per day 12/13/15 0856 12/15/15 1211   12/07/15 0600  imipenem-cilastatin (PRIMAXIN) 500 mg in sodium chloride 0.9 % 100 mL IVPB  Status:  Discontinued     500 mg 200 mL/hr over 30 Minutes Intravenous 4 times per day 12/06/15 2311 12/06/15 2329   12/07/15 0000  levofloxacin (LEVAQUIN) IVPB 750 mg  Status:  Discontinued     750 mg 100 mL/hr over 90 Minutes Intravenous Every 24 hours 12/06/15 2339 12/13/15 0856   12/06/15 2315  vancomycin (VANCOCIN) 500 mg in sodium chloride 0.9 % 100 mL IVPB  Status:  Discontinued     500 mg 100 mL/hr over 60 Minutes Intravenous Every 12 hours 12/06/15 2311 12/13/15 0856   12/06/15 2230  imipenem-cilastatin (PRIMAXIN) 500 mg in sodium chloride 0.9 % 100 mL IVPB     500 mg 200 mL/hr over 30 Minutes Intravenous  Once 12/06/15 2209 12/06/15 2335      Medications:  Scheduled: . antiseptic oral rinse  7 mL Mouth Rinse QID  . baclofen  20 mg Oral Q6H  . buPROPion  150 mg Oral Daily  . chlorhexidine gluconate  15 mL Mouth Rinse BID  . colistimethate (COLISTIN) IV  5 mg/kg/day Intravenous Q12H  . fentaNYL  50 mcg Transdermal Q72H  . fluconazole  100 mg Oral Daily  . heparin  5,000 Units Subcutaneous 3 times per day  . insulin aspart  0-9 Units Subcutaneous 6 times per day  . insulin glargine  15 Units Subcutaneous Daily  . midodrine  20 mg Per Tube TID WC  . pantoprazole sodium  40 mg Per Tube Daily  . pregabalin  100 mg Per Tube TID  . sodium chloride flush  10-40 mL Intracatheter Q12H    Objective: Vital signs in last 24 hours: Temp:  [98.8 F (37.1 C)-101.4 F (38.6 C)] 101.2 F (38.4 C) (03/11  [id number]) Pulse Rate:  [81-97] 86 (03/11 0852) Resp:  [16-25] 21 (03/11 0852) BP: (104-145)/(64-82) 109/75 mmHg (03/11 0852) SpO2:  [92 %-100 %] 99 % (03/11 0852) FiO2 (%):  [28 %] 28 % (03/11 0852) Weight:  [55.4 kg (122 lb 2.2 oz)] 55.4 kg (122 lb 2.2 oz) (03/11 0900)   General appearance: alert, cooperative and no distress Resp: rhonchi bilaterally Cardio: regular rate and rhythm GI: normal findings: bowel sounds normal and soft, non-tender Extremities: edema none. wasting.  and RUE pic is clean. non-tender. no LE cordis.   Lab Results  Recent Labs  12/15/15 0256 12/17/15 0359  WBC 18.6* 15.7*  HGB 8.5* 7.7*  HCT 27.9* 26.1*  NA 133* 135  K 5.0 6.0*  CL 101 104  CO2 22 25  BUN 46* 31*  CREATININE 0.89 0.78   Liver Panel No results for input(s): PROT, ALBUMIN, AST, ALT, ALKPHOS, BILITOT, BILIDIR, IBILI in the last 72 hours. Sedimentation Rate No  results for input(s): ESRSEDRATE in the last 72 hours. C-Reactive Protein No results for input(s): CRP in the last 72 hours.  Microbiology: Recent Results (from the past 240 hour(s))  MRSA PCR Screening     Status: Abnormal   Collection Time: 12/07/15  6:30 PM  Result Value Ref Range Status   MRSA by PCR POSITIVE (A) NEGATIVE Final    Comment:        The GeneXpert MRSA Assay (FDA approved for NASAL specimens only), is one component of a comprehensive MRSA colonization surveillance program. It is not intended to diagnose MRSA infection nor to guide or monitor treatment for MRSA infections. RESULT CALLED TO, READ BACK BY AND VERIFIED WITH: K.GARCIA,RN @2131  BY L.PITT 12/07/15   Culture, respiratory (NON-Expectorated)     Status: None (Preliminary result)   Collection Time: 12/12/15 11:17 AM  Result Value Ref Range Status   Specimen Description TRACHEAL ASPIRATE  Final   Special Requests Normal  Final   Gram Stain   Final    ABUNDANT WBC PRESENT, PREDOMINANTLY PMN FEW SQUAMOUS EPITHELIAL CELLS PRESENT MODERATE GRAM POSITIVE COCCI IN PAIRS FEW GRAM NEGATIVE RODS Performed at [facility]    Culture   Final    FEW PSEUDOMONAS AERUGINOSA Performed at [facility]    Report Status PENDING  Incomplete   Organism ID, Bacteria PSEUDOMONAS AERUGINOSA  Final      Susceptibility   Pseudomonas aeruginosa - MIC*    CEFEPIME 8 SENSITIVE Sensitive     CEFTAZIDIME 8 SENSITIVE Sensitive     CIPROFLOXACIN >=4 RESISTANT Resistant     GENTAMICIN >=16 RESISTANT Resistant     IMIPENEM >=16 RESISTANT Resistant     TOBRAMYCIN >=16 RESISTANT Resistant     * FEW PSEUDOMONAS AERUGINOSA  Culture, blood (routine x 2)     Status: None (Preliminary result)   Collection Time: 12/12/15  1:05 PM  Result Value Ref Range Status   Specimen Description BLOOD LEFT HAND  Final   Special Requests IN PEDIATRIC BOTTLE  1 ML  Final   Culture NO GROWTH 4 DAYS  Final   Report Status PENDING   Incomplete  Culture, blood (routine x 2)     Status: None (Preliminary result)   Collection Time: 12/12/15  7:20 PM  Result Value Ref Range Status   Specimen Description BLOOD LEFT HAND  Final   Special Requests IN PEDIATRIC BOTTLE 2.5CC  Final   Culture NO GROWTH 4 DAYS  Final   Report Status PENDING  Incomplete  Culture, Urine     Status: None   Collection Time: 12/13/15  6:27 AM  Result Value Ref Range Status   Specimen Description URINE, CATHETERIZED  Final   Special Requests Normal  Final   Culture >=100,000 COLONIES/mL YEAST  Final   Report Status 12/14/2015 FINAL  Final    Studies/Results: No results found.   Assessment/Plan: Pseudomonas PNA/HCAP Fungal UTI Sacral Decubiti  Continued fever today Await colistin sensi on his respiratory Cx watch his temp curve Discussed in detail with pt and his mom  Total days of antibiotics: 11 Day 2 colistin Day 2 fluconazole         [name] Infectious Diseases (pager) [phone] [email or web address] 12/17/2015, 11:36 AM  LOS: 11 days

## 2015-12-17 NOTE — Progress Notes (Addendum)
Hypoglycemic Event  CBG: 17  Treatment: Dextrose 50  Symptoms: drowsiness but easily aroused  Follow-up CBG: Time: 1758 CBG Result: 151  Possible Reasons for Event: Decreased oral intake  Comments/MD notified: no    Valdis Bevill [place]

## 2015-12-17 NOTE — Progress Notes (Signed)
Per family communication, patient "not need insulin coverage unless blood sugar 200" Father stated he has known drop if insulin coverage  for CBG in 100s. Appreciated their input and provided assurance to convey information to attending MD to review parameters.

## 2015-12-17 NOTE — Progress Notes (Signed)
PULMONARY / CRITICAL CARE MEDICINE   Name: Jason [name] MRN: [id number] DOB: 1986/05/06    ADMISSION DATE:  12/06/2015 CONSULTATION DATE:  12/06/15  REFERRING MD:  EDP  CHIEF COMPLAINT:  Decrease in O2 sats, fever, PNA  SUBJECTIVE:  Patient and mother deny acute issues  VITAL SIGNS: BP 131/89 mmHg  Pulse 96  Temp(Src) 101.2 F (38.4 C) (Oral)  Resp 17  Ht 5\' 8"  (1.727 m)  Wt 55.4 kg (122 lb 2.2 oz)  BMI 18.57 kg/m2  SpO2 99%  HEMODYNAMICS:    VENTILATOR SETTINGS: Vent Mode:  [-]  FiO2 (%):  [28 %] 28 %  INTAKE / OUTPUT: I/O last 3 completed shifts: In: 1821.8 [P.O.:480; NG/GT:1040; IV Piggyback:301.8] Out: [id number]; Drains:100; Stool:350]   PHYSICAL EXAMINATION: General: Chronically ill appearing, awake, interactive Neuro: No focal deficits HEENT: PERRL, Clean trach site. Cardiovascular: RRR, no MRG Lungs: Good vocalization through PMV/ trach. Rhonchi R lower lung pending suction Abdomen: PEG in place, + BS, ostomy Musculoskeletal: Muscle wasting bilaterally  Skin: No rashes. Sacral wound vac in place.   LABS:  BMET  Recent Labs Lab 12/14/15 1116 12/15/15 0256 12/17/15 0359  NA 133* 133* 135  K 4.6 5.0 6.0*  CL 102 101 104  CO2 22 22 25   BUN 49* 46* 31*  CREATININE 0.96 0.89 0.78  GLUCOSE 156* 137* 131*    Electrolytes  Recent Labs Lab 12/14/15 1116 12/15/15 0256 12/17/15 0359  CALCIUM 9.9 9.8 9.3    CBC  Recent Labs Lab 12/14/15 1116 12/15/15 0256 12/17/15 0359  WBC 21.6* 18.6* 15.7*  HGB 9.2* 8.5* 7.7*  HCT 29.8* 27.9* 26.1*  PLT 682* 619* 501*    Coag's No results for input(s): APTT, INR in the last 168 hours.  Sepsis Markers No results for input(s): LATICACIDVEN, PROCALCITON, O2SATVEN in the last 168 hours.  ABG No results for input(s): PHART, PCO2ART, PO2ART in the last 168 hours.  Liver Enzymes No results for input(s): AST, ALT, ALKPHOS, BILITOT, ALBUMIN in the last 168 hours.  Cardiac Enzymes No  results for input(s): TROPONINI, PROBNP in the last 168 hours.  Glucose  Recent Labs Lab 12/16/15 1734 12/16/15 2024 12/16/15 2117 12/16/15 2349 12/17/15 0402 12/17/15 0812  GLUCAP 109* 53* 70 176* 119* 232*    Imaging No results found.   STUDIES:  CXR 02/28 > bibasilar airspace densities concerning for PNA. CT abdo/ pelvis>>>no fistula CXR 3/7 >>mildly increased bibasilar opacities are noted most consistent with subsegmental atelectasis and associated pleural effusions.   CULTURES: Blood 02/28 >>No growth  Urine 02/28 >>Multiple species, suggest recollection Blood 3/1>> No growth  Sputum 3/6 >>> few pseudomonas >>> Blood Culture 3/6 >> Urine Culture 3/7 >>Yeast   MRSA pos screen  ANTIBIOTICS: Vanc 02/28 (7/7)>> 3/7 Levaquin 02/28 (7/7) >>3/7 Tigecycline 3/7 >> 3/9 Aztreonam 3/7 >> 3/9 Colistin 3/9 >>  SIGNIFICANT EVENTS: 2/28  Admitted with probable HCAP 3/2- no fevers, low secretions, remains vented, refusing Chest PT and other treatments 3/3 improved to TC, pcxr better 3/5- trach collar 24  Hrs 3/6- Tmax 103.2 3/8 still spiking temps   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 HCAP P:   Continue abx Tolerating trach collar.  PMV trials- doing well Chest PT Mobilize.   CARDIOVASCULAR A:  Chronic hypotension - on midodrine. P:  Continue midodrine  RENAL A:   Hyponatremia - chronic NONAG acidosis resolving  3/4 P:   Trend BMP  Stable  GASTROINTESTINAL A:   GERD, gastroparesis Nutrition R/o chronic fistula - NEG CT 3/4 P:   Pantoprazole TF's at goal  HEMATOLOGIC A:   Anemia - chronic, some dilution now Thrombocytosis - chronic VTE Prophylaxis P:  Transfuse for Hgb < 7 SCD's / heparin  INFECTIOUS A:   UTI - hx pseudomonal UTI in past with resistance to multiple abx - positive for yeast   Probable HCAP vs tracheitis Hx sacral wounds Some PCT rise about the same clinically P:   ID following - apprciated PICC line in place R arm. Abx changed to colistin- awaiting sensitivity results for colistin WOC following  Trend fever and WBC curve   ENDOCRINE A:   DM Hyperglycemia  Adrenal insuff likely P:   SSI  Lantus 15 units daily Low Cortisol noted- but no need for additional steroids now.   NEUROLOGIC A:   Hx seizures (not on meds), paraplegia, fibromyalgia, diabetic neuropathy P:   On 20 mg Oxycodone prn q 6 hours for pain  Home bupropion, lorazepam, & lyrica  Family updated: Mother at bedside and updated 3/10.  CD Annamaria Boots, MD [place] [facility] Pager [phone] If no answer or after 3pm call: [phone] 12/17/2015, 12:10 PM

## 2015-12-18 ENCOUNTER — Inpatient Hospital Stay (HOSPITAL_COMMUNITY): Payer: Medicaid Other

## 2015-12-18 DIAGNOSIS — R509 Fever, unspecified: Secondary | ICD-10-CM | POA: Diagnosis present

## 2015-12-18 DIAGNOSIS — I9589 Other hypotension: Secondary | ICD-10-CM

## 2015-12-18 DIAGNOSIS — R140 Abdominal distension (gaseous): Secondary | ICD-10-CM | POA: Diagnosis present

## 2015-12-18 DIAGNOSIS — J9600 Acute respiratory failure, unspecified whether with hypoxia or hypercapnia: Secondary | ICD-10-CM

## 2015-12-18 LAB — BLOOD GAS, ARTERIAL
Acid-base deficit: 0.5 mmol/L (ref 0.0–2.0)
Bicarbonate: 24.7 mEq/L — ABNORMAL HIGH (ref 20.0–24.0)
DRAWN BY: 283401
FIO2: 0.28
O2 SAT: 94 %
PH ART: 7.324 — AB (ref 7.350–7.450)
Patient temperature: 98.6
TCO2: 26.2 mmol/L (ref 0–100)
pCO2 arterial: 49 mmHg — ABNORMAL HIGH (ref 35.0–45.0)
pO2, Arterial: 72.9 mmHg — ABNORMAL LOW (ref 80.0–100.0)

## 2015-12-18 LAB — GLUCOSE, CAPILLARY
GLUCOSE-CAPILLARY: 234 mg/dL — AB (ref 65–99)
GLUCOSE-CAPILLARY: 219 mg/dL — AB (ref 65–99)
GLUCOSE-CAPILLARY: 202 mg/dL — AB (ref 65–99)
Glucose-Capillary: 130 mg/dL — ABNORMAL HIGH (ref 65–99)
Glucose-Capillary: 131 mg/dL — ABNORMAL HIGH (ref 65–99)
Glucose-Capillary: 162 mg/dL — ABNORMAL HIGH (ref 65–99)
Glucose-Capillary: 250 mg/dL — ABNORMAL HIGH (ref 65–99)
Glucose-Capillary: 46 mg/dL — ABNORMAL LOW (ref 65–99)

## 2015-12-18 LAB — CULTURE, RESPIRATORY: SPECIAL REQUESTS: NORMAL

## 2015-12-18 LAB — CULTURE, RESPIRATORY W GRAM STAIN

## 2015-12-18 MED ORDER — DIPHENHYDRAMINE HCL 12.5 MG/5ML PO ELIX
25.0000 mg | ORAL_SOLUTION | Freq: Once | ORAL | Status: AC
  Administered 2015-12-18: 25 mg
  Filled 2015-12-18 (×2): qty 10

## 2015-12-18 MED ORDER — MIDODRINE HCL 5 MG PO TABS
40.0000 mg | ORAL_TABLET | Freq: Three times a day (TID) | ORAL | Status: DC
  Administered 2015-12-19 – 2015-12-24 (×12): 40 mg
  Filled 2015-12-18 (×13): qty 8

## 2015-12-18 MED ORDER — INSULIN GLARGINE 100 UNIT/ML ~~LOC~~ SOLN: 5.0000 [IU] | Freq: Every day | SUBCUTANEOUS | Status: DC

## 2015-12-18 MED ORDER — NALOXONE HCL 0.4 MG/ML IJ SOLN
0.4000 mg | Freq: Once | INTRAMUSCULAR | Status: DC
  Filled 2015-12-18: qty 1

## 2015-12-18 MED ORDER — NALOXONE NEWBORN-WH INJECTION 0.4 MG/ML: 0.4000 mg | Freq: Once | INTRAMUSCULAR | Status: DC

## 2015-12-18 MED ORDER — INSULIN GLARGINE 100 UNIT/ML ~~LOC~~ SOLN
5.0000 [IU] | Freq: Every day | SUBCUTANEOUS | Status: DC
  Administered 2015-12-20 – 2015-12-22 (×3): 5 [IU] via SUBCUTANEOUS
  Filled 2015-12-18 (×5): qty 0.05

## 2015-12-18 MED ORDER — SODIUM CHLORIDE 0.9 % IV BOLUS (SEPSIS)
1000.0000 mL | Freq: Once | INTRAVENOUS | Status: AC
  Administered 2015-12-18: 1000 mL via INTRAVENOUS

## 2015-12-18 MED ORDER — MIDODRINE HCL 5 MG PO TABS
20.0000 mg | ORAL_TABLET | Freq: Once | ORAL | Status: AC
  Administered 2015-12-18: 20 mg via ORAL
  Filled 2015-12-18: qty 4

## 2015-12-18 NOTE — Progress Notes (Signed)
PULMONARY / CRITICAL CARE MEDICINE   Name: Jason [name] MRN: [id number] DOB: [birth year]    ADMISSION DATE:  12/06/2015 CONSULTATION DATE:  12/06/15  REFERRING MD:  EDP  CHIEF COMPLAINT:  Decrease in O2 sats, fever, PNA  SUBJECTIVE:  Itching after ibuprofen Repeated episodes of hypoglycemia  VITAL SIGNS: BP 99/66 mmHg  Pulse 93  Temp(Src) 102.2 F (39 C) (Oral)  Resp 19  Ht 5\' 8"  (1.727 m)  Wt 56.2 kg (123 lb 14.4 oz)  BMI 18.84 kg/m2  SpO2 98%  HEMODYNAMICS:    VENTILATOR SETTINGS: Vent Mode:  [-]  FiO2 (%):  [28 %] 28 %  INTAKE / OUTPUT: I/O last 3 completed shifts: In: [id number] [P.O.:240; I.V.:110; NG/GT:717.2; IV Piggyback:301.8] Out: [id number] [Urine:5410; Drains:50]   PHYSICAL EXAMINATION: General: Chronically ill appearing, awake, somnolent after benadryl for itching Neuro: No focal deficits HEENT: PERRL, Clean trach site. Cardiovascular: RRR, no MRG Lungs: Quiet breathing through trach/PMV Abdomen: PEG in place, + BS, ostomy Musculoskeletal: Muscle wasting bilaterally  Skin: No rashes. Sacral wound vac in place.   LABS:  BMET  Recent Labs Lab 12/14/15 1116 12/15/15 0256 12/17/15 0359  NA 133* 133* 135  K 4.6 5.0 6.0*  CL 102 101 104  CO2 22 22 25   BUN 49* 46* 31*  CREATININE 0.96 0.89 0.78  GLUCOSE 156* 137* 131*    Electrolytes  Recent Labs Lab 12/14/15 1116 12/15/15 0256 12/17/15 0359  CALCIUM 9.9 9.8 9.3    CBC  Recent Labs Lab 12/14/15 1116 12/15/15 0256 12/17/15 0359  WBC 21.6* 18.6* 15.7*  HGB 9.2* 8.5* 7.7*  HCT 29.8* 27.9* 26.1*  PLT 682* 619* 501*    Coag's No results for input(s): APTT, INR in the last 168 hours.  Sepsis Markers No results for input(s): LATICACIDVEN, PROCALCITON, O2SATVEN in the last 168 hours.  ABG No results for input(s): PHART, PCO2ART, PO2ART in the last 168 hours.  Liver Enzymes No results for input(s): AST, ALT, ALKPHOS, BILITOT, ALBUMIN in the last 168 hours.  Cardiac Enzymes No  results for input(s): TROPONINI, PROBNP in the last 168 hours.  Glucose  Recent Labs Lab 12/17/15 1753 12/17/15 2014 12/18/15 0023 12/18/15 0118 12/18/15 0404 12/18/15 0743  GLUCAP 151* 219* 46* 130* 234* 219*    Imaging No results found.   STUDIES:  CXR 02/28 > bibasilar airspace densities concerning for PNA. CT abdo/ pelvis>>>no fistula CXR 3/7 >>mildly increased bibasilar opacities are noted most consistent with subsegmental atelectasis and associated pleural effusions.   CULTURES: Blood 02/28 >>No growth  Urine 02/28 >>Multiple species, suggest recollection Blood 3/1>> No growth  Sputum 3/6 >>> few pseudomonas >>> Blood Culture 3/6 >> Urine Culture 3/7 >>Yeast   MRSA pos screen  ANTIBIOTICS: Vanc 02/28 (7/7)>> 3/7 Levaquin 02/28 (7/7) >>3/7 Tigecycline 3/7 >> 3/9 Aztreonam 3/7 >> 3/9 Colistin 3/9 >>  SIGNIFICANT EVENTS: 2/28  Admitted with probable HCAP 3/2- no fevers, low secretions, remains vented, refusing Chest PT and other treatments 3/3 improved to TC, pcxr better 3/5- trach collar 24  Hrs 3/6- Tmax 103.2 3/8 still spiking temps   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 HCAP P:   Continue abx Tolerating trach collar.  PMV trials- doing well Chest PT Mobilize.   CARDIOVASCULAR A:  Chronic hypotension - on midodrine. P:  Continue midodrine  RENAL A:   Hyponatremia - chronic NONAG acidosis resolving 3/4 P:  Trend BMP  Stable  GASTROINTESTINAL A:   GERD, gastroparesis Nutrition R/o chronic fistula - NEG CT 3/4 P:   Pantoprazole TF's at goal  HEMATOLOGIC A:   Anemia - chronic, some dilution now Thrombocytosis - chronic VTE Prophylaxis P:  Transfuse for Hgb < 7 SCD's / heparin  INFECTIOUS A:   UTI - hx pseudomonal UTI in past with resistance to multiple abx - positive for yeast   Probable HCAP vs tracheitis Hx sacral wounds Some PCT rise about the same clinically P:   ID following - apprciated PICC line in place R arm. Abx changed to colistin- awaiting sensitivity results for colistin WOC following  Trend fever and WBC curve   ENDOCRINE A:   DM brittle Repeated episodes hypoglycemia Hyperglycemia  Adrenal insuff likely P:   SSI  Lantus reduced to 5 units  starting 3/13 because of repeated lows. Continue sensitive scale SSI Low Cortisol noted- but no need for additional steroids now.   NEUROLOGIC A:   Hx seizures (not on meds), paraplegia, fibromyalgia, diabetic neuropathy P:   On 20 mg Oxycodone prn q 6 hours for pain  Home bupropion, lorazepam, & lyrica  Family updated: Mother at bedside and updated 3/12.  CD Annamaria Boots, MD [place] [facility] Pager [phone] If no answer or after 3pm call: [phone] 12/18/2015, 11:59 AM

## 2015-12-18 NOTE — Progress Notes (Signed)
Called to bedside to evaluate patient with hypotension and altered mental status. Per nursing staff he is obtunded and not responding to noxious stimuli. Parents report that he is normally bright and interactive and that this is a significant change for him.   Exam:  Temp:  [99.1 F (37.3 C)-102.2 F (39 C)] 100.9 F (38.3 C) (03/12 2250) Pulse Rate:  [85-97] 87 (03/12 2250) Resp:  [7-21] 7 (03/12 2250) BP: (67-164)/(43-98) 71/43 mmHg (03/12 2250) SpO2:  [80 %-100 %] 95 % (03/12 2250) FiO2 (%):  [28 %] 28 % (03/12 1937) Weight:  [56.2 kg (123 lb 14.4 oz)] 56.2 kg (123 lb 14.4 oz) (03/12 0439)  Patient arouses to noxious stimuli and asks for PMV. He denies pain, doesn't feel dizzy, is a little confused, though. Breath sounds coarse Heart sounds okay Abdomen soft, non-distended Gestures with upper extremities Lower extremities in boots  ASSESSMENT: Chronic respiratory failure Hypotension Altered mental status  PLAN: 1L IVF as ordered by Dr. Oletta Darter Hold off on narcan as patient is now awake and interactive Continue current antibiotics Increase midodrine to home dose (his mother is certain his home dose is 40mg  TID, although I can't find documentation of this) - 20mg  now and start 40mg  TID in AM Follow clinically  Critical Care Time devoted to patient care services, exclusive of separately billable procedures, described in this note is 33 minutes.   Yisroel Ramming, MD Pulmonary and Critical Care 12/18/2015 11:22 PM

## 2015-12-18 NOTE — Progress Notes (Signed)
Pt received Ibuprofen 400 mg at 0836 for febrile episode. Started c/o itching at 0930; mother believed since Ibuprofen is new to pt, may cause the symptom. Dr Annamaria Boots paged and notified. Orders received. Will monitor closely.

## 2015-12-18 NOTE — Progress Notes (Addendum)
eLink Physician-Brief Progress Note Patient Name: Jason [name] DOB: 13-May-1986 MRN: [id number]   Date of Service  12/18/2015  HPI/Events of Note  Patient with [facility] and hypotension. BP = 73/50. Blood glucose = 131.  eICU Interventions  Will order: 1. Narcan 0.4 mg IV now. 2. CBC, CMP and ABG now.  3. 0.9 NaCl 1 liter IV over 1 hour now.   Will ask Dr. Minette Brine, the floor coverage physician to evaluate the patient.     Intervention Category Major Interventions: Change in mental status - evaluation and management  [name],Steven Cornelia Copa 12/18/2015, 10:56 PM

## 2015-12-18 NOTE — Progress Notes (Signed)
INFECTIOUS DISEASE PROGRESS NOTE  ID: Jason [name] is a 30 y.o. male with  Principal Problem:   HCAP (healthcare-associated pneumonia) Active Problems:   Neurogenic bladder   Uncontrolled type 1 diabetes mellitus with hyperglycemia ([place])   Acute on chronic respiratory failure ([name])   Recurrent UTI   Respiratory failure, chronic ([name])   Paraplegia ([name])   Pressure ulcer   Acute respiratory failure ([name])   Pneumonia   Fistula   Pyrexia   Healthcare-associated pneumonia  Subjective: Per mom- confused post pain rx, benadryl  Abtx:  Anti-infectives    Start     Dose/Rate Route Frequency Ordered Stop   12/15/15 1300  fluconazole (DIFLUCAN) tablet 100 mg     100 mg Oral Daily 12/15/15 1211     12/15/15 1230  colistimethate (COLYMYCIN) 137.25 mg in sodium chloride 0.9 % 100 mL IVPB     5 mg/kg/day  54.9 kg 203.7 mL/hr over 30 Minutes Intravenous Every 12 hours 12/15/15 1211     12/13/15 2200  tigecycline (TYGACIL) 50 mg in sodium chloride 0.9 % 100 mL IVPB  Status:  Discontinued     50 mg 200 mL/hr over 30 Minutes Intravenous Every 12 hours 12/13/15 0856 12/15/15 1211   12/13/15 1000  tigecycline (TYGACIL) 100 mg in sodium chloride 0.9 % 100 mL IVPB     100 mg 200 mL/hr over 30 Minutes Intravenous  Once 12/13/15 0856 12/13/15 1117   12/13/15 1000  aztreonam (AZACTAM) 2 g in dextrose 5 % 50 mL IVPB  Status:  Discontinued     2 g 100 mL/hr over 30 Minutes Intravenous 3 times per day 12/13/15 0856 12/15/15 1211   12/07/15 0600  imipenem-cilastatin (PRIMAXIN) 500 mg in sodium chloride 0.9 % 100 mL IVPB  Status:  Discontinued     500 mg 200 mL/hr over 30 Minutes Intravenous 4 times per day 12/06/15 2311 12/06/15 2329   12/07/15 0000  levofloxacin (LEVAQUIN) IVPB 750 mg  Status:  Discontinued     750 mg 100 mL/hr over 90 Minutes Intravenous Every 24 hours 12/06/15 2339 12/13/15 0856   12/06/15 2315  vancomycin (VANCOCIN) 500 mg in sodium chloride 0.9 % 100 mL IVPB  Status:   Discontinued     500 mg 100 mL/hr over 60 Minutes Intravenous Every 12 hours 12/06/15 2311 12/13/15 0856   12/06/15 2230  imipenem-cilastatin (PRIMAXIN) 500 mg in sodium chloride 0.9 % 100 mL IVPB     500 mg 200 mL/hr over 30 Minutes Intravenous  Once 12/06/15 2209 12/06/15 2335      Medications:  Scheduled: . antiseptic oral rinse  7 mL Mouth Rinse QID  . baclofen  20 mg Oral Q6H  . buPROPion  150 mg Oral Daily  . chlorhexidine gluconate  15 mL Mouth Rinse BID  . colistimethate (COLISTIN) IV  5 mg/kg/day Intravenous Q12H  . fentaNYL  50 mcg Transdermal Q72H  . fluconazole  100 mg Oral Daily  . heparin  5,000 Units Subcutaneous 3 times per day  . insulin aspart  0-9 Units Subcutaneous 6 times per day  . [START ON 12/19/2015] insulin glargine  5 Units Subcutaneous Daily  . midodrine  20 mg Per Tube TID WC  . pantoprazole sodium  40 mg Per Tube Daily  . pregabalin  100 mg Per Tube TID  . sodium chloride flush  10-40 mL Intracatheter Q12H    Objective: Vital signs in last 24 hours: Temp:  [97.9 F (36.6 C)-102.5 F (39.2 C)]  100 F (37.8 C) (03/12 1159) Pulse Rate:  [83-96] 96 (03/12 1159) Resp:  [13-21] 13 (03/12 1159) BP: (76-164)/(57-98) 164/98 mmHg (03/12 1159) SpO2:  [93 %-100 %] 100 % (03/12 1209) FiO2 (%):  [28 %] 28 % (03/12 1209) Weight:  [56.2 kg (123 lb 14.4 oz)] 56.2 kg (123 lb 14.4 oz) (03/12 0439)   General appearance: fatigued and no distress Resp: rhonchi bilaterally Cardio: regular rate and rhythm GI: abnormal findings:  distended, hypoactive bowel sounds and non-tender  Lab Results  Recent Labs  12/17/15 0359  WBC 15.7*  HGB 7.7*  HCT 26.1*  NA 135  K 6.0*  CL 104  CO2 25  BUN 31*  CREATININE 0.78   Liver Panel No results for input(s): PROT, ALBUMIN, AST, ALT, ALKPHOS, BILITOT, BILIDIR, IBILI in the last 72 hours. Sedimentation Rate No results for input(s): ESRSEDRATE in the last 72 hours. C-Reactive Protein No results for input(s): CRP  in the last 72 hours.  Microbiology: Recent Results (from the past 240 hour(s))  Culture, respiratory (NON-Expectorated)     Status: None   Collection Time: 12/12/15 11:17 AM  Result Value Ref Range Status   Specimen Description TRACHEAL ASPIRATE  Final   Special Requests Normal  Final   Gram Stain   Final    ABUNDANT WBC PRESENT, PREDOMINANTLY PMN FEW SQUAMOUS EPITHELIAL CELLS PRESENT MODERATE GRAM POSITIVE COCCI IN PAIRS FEW GRAM NEGATIVE RODS Performed at [facility]    Culture   Final    FEW PSEUDOMONAS AERUGINOSA Note: COLISTIN 1.5ug/mL SENSITIVE ETEST results for this drug are "FOR INVESTIGATIONAL USE ONLY" and should NOT be used for clinical purposes. MULTI DRUG RESISTANT ORGANISM Note: CRITICAL RESULT CALLED TO, READ BACK BY AND VERIFIED WITH: TEPE RN 130PM 12/18/15 GUSTK Performed at [facility]    Report Status 12/18/2015 FINAL  Final   Organism ID, Bacteria PSEUDOMONAS AERUGINOSA  Final      Susceptibility   Pseudomonas aeruginosa - MIC*    CEFEPIME 8 SENSITIVE Sensitive     CEFTAZIDIME 8 SENSITIVE Sensitive     CIPROFLOXACIN >=4 RESISTANT Resistant     GENTAMICIN >=16 RESISTANT Resistant     IMIPENEM >=16 RESISTANT Resistant     TOBRAMYCIN >=16 RESISTANT Resistant     PIP/TAZO RESISTANT      TICAR/CLAVULANIC RESISTANT      AMIKACIN <=16 SENSITIVE Sensitive     AZTREONAM >16 RESISTANT Resistant     * FEW PSEUDOMONAS AERUGINOSA  Culture, blood (routine x 2)     Status: None   Collection Time: 12/12/15  1:05 PM  Result Value Ref Range Status   Specimen Description BLOOD LEFT HAND  Final   Special Requests IN PEDIATRIC BOTTLE  1 ML  Final   Culture NO GROWTH 5 DAYS  Final   Report Status 12/17/2015 FINAL  Final  Culture, blood (routine x 2)     Status: None   Collection Time: 12/12/15  7:20 PM  Result Value Ref Range Status   Specimen Description BLOOD LEFT HAND  Final   Special Requests IN PEDIATRIC BOTTLE 2.5CC  Final   Culture NO GROWTH 5  DAYS  Final   Report Status 12/17/2015 FINAL  Final  Culture, Urine     Status: None   Collection Time: 12/13/15  6:27 AM  Result Value Ref Range Status   Specimen Description URINE, CATHETERIZED  Final   Special Requests Normal  Final   Culture >=100,000 COLONIES/mL YEAST  Final   Report  Status 12/14/2015 FINAL  Final    Studies/Results: No results found.   Assessment/Plan: Pseudomonas PNA/HCAP Fungal UTI Sacral Decubiti  continued fever but WBC improving.  Pseudomonas is sensitive to colistin, cefepime and ceftaz.  No change in anbx (due to drug allergies- anaphylaxis with Cefurox) Will check plain film of abd- mild distension, no BS.   Total days of antibiotics: 12 Day 3 colistin Day 3 fluconazole         [name] Infectious Diseases (pager) [phone] [email or web address] 12/18/2015, 3:36 PM  LOS: 12 days     3

## 2015-12-18 NOTE — Progress Notes (Signed)
Hypoglycemic Event  CBG: 46 (0023)  Treatment: Soda, Icee, Crackers  Symptoms: None  Follow-up CBG: Time: 0130   CBG Result: 130  Possible Reasons for Event: insulin coverage while not on tube feeds  Comments/MD notified: Yes   Loura Back

## 2015-12-19 ENCOUNTER — Inpatient Hospital Stay (HOSPITAL_COMMUNITY): Payer: Medicaid Other

## 2015-12-19 DIAGNOSIS — N179 Acute kidney failure, unspecified: Secondary | ICD-10-CM

## 2015-12-19 DIAGNOSIS — R451 Restlessness and agitation: Secondary | ICD-10-CM

## 2015-12-19 DIAGNOSIS — G9340 Encephalopathy, unspecified: Secondary | ICD-10-CM

## 2015-12-19 LAB — CBC WITH DIFFERENTIAL/PLATELET
BASOS ABS: 0.2 10*3/uL — AB (ref 0.0–0.1)
Basophils Relative: 1 %
EOS ABS: 0.5 10*3/uL (ref 0.0–0.7)
EOS PCT: 3 %
HEMATOCRIT: 25.2 % — AB (ref 39.0–52.0)
HEMOGLOBIN: 7.3 g/dL — AB (ref 13.0–17.0)
Lymphocytes Relative: 25 %
Lymphs Abs: 4.5 10*3/uL — ABNORMAL HIGH (ref 0.7–4.0)
MCH: 23.8 pg — ABNORMAL LOW (ref 26.0–34.0)
MCHC: 29 g/dL — AB (ref 30.0–36.0)
MCV: 82.1 fL (ref 78.0–100.0)
MONO ABS: 1.4 10*3/uL — AB (ref 0.1–1.0)
Monocytes Relative: 8 %
NEUTROS PCT: 63 %
Neutro Abs: 11.3 10*3/uL — ABNORMAL HIGH (ref 1.7–7.7)
PLATELETS: 452 10*3/uL — AB (ref 150–400)
RBC: 3.07 MIL/uL — AB (ref 4.22–5.81)
RDW: 16 % — AB (ref 11.5–15.5)
WBC: 17.9 10*3/uL — AB (ref 4.0–10.5)

## 2015-12-19 LAB — COMPREHENSIVE METABOLIC PANEL
ALT: 13 U/L — ABNORMAL LOW (ref 17–63)
ANION GAP: 7 (ref 5–15)
AST: 21 U/L (ref 15–41)
Albumin: 2 g/dL — ABNORMAL LOW (ref 3.5–5.0)
Alkaline Phosphatase: 81 U/L (ref 38–126)
BILIRUBIN TOTAL: 0.6 mg/dL (ref 0.3–1.2)
BUN: 28 mg/dL — AB (ref 6–20)
CHLORIDE: 105 mmol/L (ref 101–111)
CO2: 25 mmol/L (ref 22–32)
Calcium: 9.6 mg/dL (ref 8.9–10.3)
Creatinine, Ser: 1.57 mg/dL — ABNORMAL HIGH (ref 0.61–1.24)
GFR, EST NON AFRICAN AMERICAN: 57 mL/min — AB (ref >60)
Glucose, Bld: 104 mg/dL — ABNORMAL HIGH (ref 65–99)
POTASSIUM: 5.6 mmol/L — AB (ref 3.5–5.1)
Sodium: 137 mmol/L (ref 135–145)
TOTAL PROTEIN: 6.9 g/dL (ref 6.5–8.1)

## 2015-12-19 LAB — C DIFFICILE QUICK SCREEN W PCR REFLEX
C DIFFICILE (CDIFF) TOXIN: NEGATIVE
C Diff antigen: NEGATIVE
C Diff interpretation: NEGATIVE

## 2015-12-19 LAB — LACTIC ACID, PLASMA: LACTIC ACID, VENOUS: 1 mmol/L (ref 0.5–2.0)

## 2015-12-19 LAB — BLOOD GAS, ARTERIAL
ACID-BASE EXCESS: 0.3 mmol/L (ref 0.0–2.0)
BICARBONATE: 25.4 meq/L — AB (ref 20.0–24.0)
Drawn by: 313941
FIO2: 0.35
O2 SAT: 97.1 %
PATIENT TEMPERATURE: 98.6
PO2 ART: 93.2 mmHg (ref 80.0–100.0)
TCO2: 26.9 mmol/L (ref 0–100)
pCO2 arterial: 48.8 mmHg — ABNORMAL HIGH (ref 35.0–45.0)
pH, Arterial: 7.336 — ABNORMAL LOW (ref 7.350–7.450)

## 2015-12-19 LAB — GLUCOSE, CAPILLARY
GLUCOSE-CAPILLARY: 15 mg/dL — AB (ref 65–99)
GLUCOSE-CAPILLARY: 89 mg/dL (ref 65–99)
GLUCOSE-CAPILLARY: 259 mg/dL — AB (ref 65–99)
Glucose-Capillary: 146 mg/dL — ABNORMAL HIGH (ref 65–99)
Glucose-Capillary: 174 mg/dL — ABNORMAL HIGH (ref 65–99)
Glucose-Capillary: 183 mg/dL — ABNORMAL HIGH (ref 65–99)
Glucose-Capillary: 272 mg/dL — ABNORMAL HIGH (ref 65–99)

## 2015-12-19 MED ORDER — SODIUM POLYSTYRENE SULFONATE 15 GM/60ML PO SUSP
45.0000 g | Freq: Once | ORAL | Status: AC
  Administered 2015-12-19: 45 g
  Filled 2015-12-19: qty 180

## 2015-12-19 NOTE — Procedures (Signed)
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  Medications: acetaminophen (TYLENOL) tablet 650 mg albuterol (PROVENTIL) (2.5 MG/3ML) 0.083% nebulizer solution 2.5 mg baclofen (LIORESAL) 10 mg/mL oral suspension 20 mg buPROPion (WELLBUTRIN XL) 24 hr tablet 150 mg fentaNYL (DURAGESIC - dosed mcg/hr) patch 50 mcg fluconazole (DIFLUCAN) tablet 100 mg LORazepam (ATIVAN) tablet 0.5 mg midodrine (PROAMATINE) tablet 40 mg naloxone (NARCAN) injection 0.4 mg oxyCODONE (Oxy IR/ROXICODONE) immediate release tablet 20 mg pantoprazole sodium (PROTONIX) 40 mg/20 mL oral suspension 40 mg  Technical Summary: A multichannel digital EEG recording measured by the international 10-20 system with electrodes applied with paste and impedances below 5000 ohms performed as portable with EKG monitoring in an awake and asleep patient.  Hyperventilation and photic stimulation were not performed.  The digital EEG was referentially recorded, reformatted, and digitally filtered in a variety of bipolar and referential montages for optimal display.   Description: The patient is awake and asleep during the recording. He is s/p tracheostomy on a ventilator, does not follow commands. There is no clear posterior dominant rhythm. The background consists of a large amount of diffuse 5-6 Hz theta slowing and occasional 2-4 Hz delta slowing, at times with triphasic-like appearance. During drowsiness and sleep, there is an increase in theta and delta slowing of the background with occasional vertex waves seen.  Hyperventilation and photic stimulation were not performed.  There were no epileptiform discharges or electrographic seizures seen.    EKG lead was unremarkable.  Impression: This  awake and asleep EEG is abnormal due to mild to moderate diffuse slowing of the waking background with triphasic-like waves.  Clinical Correlation of the above findings indicates diffuse cerebral dysfunction that is non-specific in etiology and can be seen with hypoxic/ischemic injury, toxic/metabolic encephalopathies, or medication effect. Triphasic-like waves are typically seen with hepatic encephalopathy, but can be seen with other metabolic encephalopathies as well. There were no epileptiform discharges or electrographic seizures in this study. Clinical correlation is advised.   Ellouise Newer, M.D.

## 2015-12-19 NOTE — Progress Notes (Signed)
PULMONARY / CRITICAL CARE MEDICINE   Name: Jason [name] MRN: [id number] DOB: 11/05/1985    ADMISSION DATE:  12/06/2015 CONSULTATION DATE:  12/06/15  REFERRING MD:  EDP  CHIEF COMPLAINT:  Decrease in O2 sats, fever, PNA  SUBJECTIVE:  Confused  VITAL SIGNS: BP 122/76 mmHg  Pulse 88  Temp(Src) 98.7 F (37.1 C) (Axillary)  Resp 12  Ht 5\' 8"  (1.727 m)  Wt 119 lb 11.4 oz (54.3 kg)  BMI 18.21 kg/m2  SpO2 98%   VENTILATOR SETTINGS: Vent Mode:  [-]  FiO2 (%):  [28 %-35 %] 35 %  INTAKE / OUTPUT: I/O last 3 completed shifts: In: 2646.8 [I.V.:240; NG/GT:1105; IV Piggyback:1301.8] Out: 2810 [Urine:2810]   PHYSICAL EXAMINATION: General: Chronically ill -->now acutely encephalopathic  Neuro: awakens to stimuli, but not oriented. Calling out "mom mom", not following commands HEENT: PERRL, Clean trach site. Cardiovascular: RRR, no MRG Lungs: Quiet breathing through trach/PMV, no rhonchi  Abdomen: PEG in place, + BS, ostomy Musculoskeletal: Muscle wasting bilaterally  Skin: No rashes. Sacral wound vac in place.   LABS:  BMET  Recent Labs Lab 12/15/15 0256 12/17/15 0359 12/18/15 2310  NA 133* 135 137  K 5.0 6.0* 5.6*  CL 101 104 105  CO2 22 25 25   BUN 46* 31* 28*  CREATININE 0.89 0.78 1.57*  GLUCOSE 137* 131* 104*    Electrolytes  Recent Labs Lab 12/15/15 0256 12/17/15 0359 12/18/15 2310  CALCIUM 9.8 9.3 9.6    CBC  Recent Labs Lab 12/15/15 0256 12/17/15 0359 12/18/15 2310  WBC 18.6* 15.7* 17.9*  HGB 8.5* 7.7* 7.3*  HCT 27.9* 26.1* 25.2*  PLT 619* 501* 452*    Coag's No results for input(s): APTT, INR in the last 168 hours.  Sepsis Markers  Recent Labs Lab 12/18/15 2310  LATICACIDVEN 1.0    ABG  Recent Labs Lab 12/18/15 2355  PHART 7.324*  PCO2ART 49.0*  PO2ART 72.9*    Liver Enzymes  Recent Labs Lab 12/18/15 2310  AST 21  ALT 13*  ALKPHOS 81  BILITOT 0.6  ALBUMIN 2.0*    Cardiac Enzymes No results for input(s):  TROPONINI, PROBNP in the last 168 hours.  Glucose  Recent Labs Lab 12/18/15 1158 12/18/15 1616 12/18/15 1949 12/18/15 2238 12/19/15 0109 12/19/15 0455  GLUCAP 250* 202* 162* 131* 89 146*    Imaging Dg Abd Portable 1v  12/18/2015  CLINICAL DATA:  Acute onset of abdominal distention. Initial encounter. EXAM: PORTABLE ABDOMEN - 1 VIEW COMPARISON:  CT of the abdomen and pelvis from 12/10/2015 FINDINGS: The visualized bowel gas pattern is unremarkable. Scattered air and stool filled loops of colon are seen; no abnormal dilatation of small bowel loops is seen to suggest small bowel obstruction. No free intra-abdominal air is identified, though evaluation for free air is limited on a single supine view. The stomach is partially filled with air. An IVC filter is noted. The visualized osseous structures are within normal limits; the sacroiliac joints are unremarkable in appearance. The visualized lung bases are essentially clear. IMPRESSION: Unremarkable bowel gas pattern; no free intra-abdominal air seen. Moderate amount of stool noted in the colon. Stomach partially filled with air. Electronically Signed   By: Garald Balding M.D.   On: 12/18/2015 21:10     STUDIES:  CXR 02/28 > bibasilar airspace densities concerning for PNA. CT abdo/ pelvis>>>no fistula CXR 3/7 >>mildly increased bibasilar opacities are noted most consistent with subsegmental atelectasis and associated pleural effusions.   CULTURES: Blood 02/28 >>No  growth  Urine 02/28 >>Multiple species, suggest recollection Blood 3/1>> No growth  Sputum 3/6 >>> few pseudomonas >>>MDR Blood Culture 3/6 >> Urine Culture 3/7 >>Yeast   MRSA pos screen  ANTIBIOTICS: Vanc 02/28 (7/7)>> 3/7 Levaquin 02/28 (7/7) >>3/7 Tigecycline 3/7 >> 3/9 Aztreonam 3/7 >> 3/9 Colistin 3/9 >>  SIGNIFICANT EVENTS: 2/28  Admitted with probable HCAP 3/2- no fevers, low secretions, remains vented, refusing Chest PT and other treatments 3/3 improved to  TC, pcxr better 3/5- trach collar 24  Hrs 3/6- Tmax 103.2 3/8 still spiking temps  3/9 Colistin started.  3/13: encephalopathic, rising creatinine, hyperkalemic.   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 HCAP P:   Repeat ABG, might need to go back on vent PMV  As tolerated See ID section  Chest PT Mobilize.   CARDIOVASCULAR A:  Chronic hypotension - on midodrine. P:  Continue midodrine (dose adjusted up)  RENAL A:   Hyponatremia - chronic NONAG acidosis resolving 3/4 New AKI and hyperkalemia -->was hypotensive but also on Colistin P:   Kayexalate X1 Renal dose meds F/u chem in am   GASTROINTESTINAL A:   GERD, gastroparesis Nutrition R/o chronic fistula - NEG CT 3/4 P:   Pantoprazole TF's at goal  HEMATOLOGIC A:   Anemia - chronic, some dilution now Thrombocytosis - chronic VTE Prophylaxis P:  Transfuse for Hgb < 7 SCD's / heparin  INFECTIOUS A:   UTI - hx pseudomonal UTI in past with resistance to multiple abx - positive for yeast  HCAP-->pseudomonas MDR (S) colistin w/ lack of current pulmonary symptoms wonder if this is more colonization Hx sacral wounds  P:   ID following - appreciated  Abx changed to colistin->cont per ID w/ home PICC--> may need to consider just stopping the Colistin  WOC following  Trend fever and WBC curve  Ck C diff to ensure no occult cdiff infection given multi drug exposures  ENDOCRINE A:   DM brittle Repeated episodes hypoglycemia-->better w/ reduced lantus Hyperglycemia  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 Continue sensitive scale Low Cortisol noted- but no need for additional steroids now.   NEUROLOGIC A:   Hx seizures (not on meds), paraplegia, fibromyalgia, diabetic neuropathy Acute encephalopathy >got  benadryl 3/12. Could be r/t this. Also his pCo2 was rising as of last evening. Suspect this is metabolic in nature and r/t polypharm  P:   On 20 mg Oxycodone prn q 6 hours for pain  Home bupropion, lorazepam, & lyrica No more benadryl Have ID  Get EEG  Family updated: Mother at bedside and updated daily  Erick Colace ACNP-BC [place] Pager # [phone] OR # [phone] if no answer   12/19/2015, 8:53 AM

## 2015-12-19 NOTE — Consult Note (Addendum)
WOC wound follow up:  Vac did not maintain a seal since this weekend, and was removed and moist gauze dressings applied. Re-applied 3 Vac dressings leaving the rectum area free from dressings, in hopes that suction remain in place if another stool occurs. 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 Rectum area is free from dressings. Pt tolerated without pain. Attached Y connector to one machine with cont suction on at 122mm. Plan for dressing change on Wed. If stooling causes Vac to loose seal, then orders provided for the staff nurse to apply moist gauze dressings until [place] can re-apply the Vac dressings. He can resume use of his own negative pressure device after discharge. One hour spent performing this complex Vac dressing. Mother at bedside to discuss plan of care. Julien Girt MSN, RN, [place], [place], [place]

## 2015-12-19 NOTE — Progress Notes (Signed)
Bedside EEG completed, results pending. 

## 2015-12-19 NOTE — Progress Notes (Signed)
When turning pt to change bed pad RN found wound vac dressings all falling off the wounds.  WOC is due to come for a dressing change in the morning so wounds were packed wet to dry and dressed with ABD pads. Wound vac turned off at 2300.  Also while turning pt was desatting, very obtunded, and pressure dropping. PCCM was notified, order obtained for labs and ABG. Midodrine dose adjusted to what pts home dosage is (40mg ) and a one time dose of 20mg  was given along with a Liter bolus. Pt has had mild temp with max of 101.9, ice packs were used and temp now 98.9. RN will continue to monitor.

## 2015-12-19 NOTE — Progress Notes (Signed)
Patient ID: Jason [name], male   DOB: 03/06/1986, 30 y.o.   MRN: [id number]         [facility] for Infectious Disease    Date of Admission:  12/06/2015   Total days of antibiotics 13        Day 5 colistin        Day 5 fluconazole         Principal Problem:   HCAP (healthcare-associated pneumonia) Active Problems:   Acute on chronic respiratory failure ([name])   Recurrent UTI   Pressure ulcer   Neurogenic bladder   Uncontrolled type 1 diabetes mellitus with hyperglycemia ([name])   Respiratory failure, chronic ([name])   Paraplegia ([name])   Acute respiratory failure ([name])   Pneumonia   Fistula   Pyrexia   Healthcare-associated pneumonia   Abdominal distension   Fever   . antiseptic oral rinse  7 mL Mouth Rinse QID  . baclofen  20 mg Oral Q6H  . buPROPion  150 mg Oral Daily  . chlorhexidine gluconate  15 mL Mouth Rinse BID  . fentaNYL  50 mcg Transdermal Q72H  . fluconazole  100 mg Oral Daily  . heparin  5,000 Units Subcutaneous 3 times per day  . insulin aspart  0-9 Units Subcutaneous 6 times per day  . insulin glargine  5 Units Subcutaneous Daily  . midodrine  40 mg Per Tube TID WC  . naLOXone (NARCAN)  injection  0.4 mg Intramuscular Once  . pantoprazole sodium  40 mg Per Tube Daily  . pregabalin  100 mg Per Tube TID  . sodium chloride flush  10-40 mL Intracatheter Q12H  . sodium polystyrene  45 g Per Tube Once   Review of Systems: Review of Systems  Unable to perform ROS: mental acuity    Past Medical History  Diagnosis Date  . GERD (gastroesophageal reflux disease)   . Asthma   . Hx MRSA infection     on face  . Gastroparesis   . Diabetic neuropathy ([place])   . Seizures ([place])   . Family history of anesthesia complication     Pt mother can't have epidural procedures  . Dysrhythmia   . Pneumonia   . Arthritis   . Fibromyalgia   . 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 Type I diabetes mellitus ([place])     sees Dr. Loanne Drilling     . Syncope 02/16/2015  . Multiple allergies 11/02/2015  . Recurrent UTI 11/02/2015    Social History  Substance Use Topics  . Smoking status: Former Smoker -- 1.00 packs/day for 12 years    Types: Cigars, Cigarettes    Quit date: 09/07/2014  . Smokeless tobacco: Never Used  . Alcohol Use: No    Family History  Problem Relation Age of Onset  . Diabetes Father   . Hypertension Father   . Asthma      fhx  . Hypertension      fhx  . Stroke      fhx  . Heart disease Mother    Allergies  Allergen Reactions  . Cefuroxime Axetil Anaphylaxis  . Ertapenem Other (See Comments)    Rash and confusion-->tolerated Imipenem   . Morphine And Related Other (See Comments)    Changed mental status, confusion, headache, visual hallucination  . Penicillins Anaphylaxis and Other (See Comments)    Tolerated Imipenem; no reaction to 7 day course of amoxicillin in 2015 Has patient had a PCN  reaction causing immediate rash, facial/tongue/throat swelling, SOB or lightheadedness with hypotension: Yes Has patient had a PCN reaction causing severe rash involving mucus membranes or skin necrosis: No Has patient had a PCN reaction that required hospitalization Yes Has patient had a PCN reaction occurring within the last 10 years: No If all of the above answers are "NO", then may proceed with Cephalo  . Sulfa Antibiotics Anaphylaxis, Shortness Of Breath and Other (See Comments)  . Tessalon [Benzonatate] Anaphylaxis  . Shellfish Allergy Itching and Other (See Comments)    Took benadryl to alleviate reaction  . Levaquin [Levofloxacin In D5w] Swelling    Hand and forearm swelling  . Nsaids Itching and Other (See Comments)    Risk of bleeding, itching  . Miripirium Rash and Other (See Comments)    Change in mental status    OBJECTIVE: Filed Vitals:   12/19/15 0600 12/19/15 0700 12/19/15 0815 12/19/15 0958  BP: 127/83 136/87 122/76   Pulse: 81 84 88   Temp:   98.7 F (37.1 C)   TempSrc:   Axillary    Resp: 10 20 12    Height:      Weight:      SpO2: 99% 97% 98% 99%   Body mass index is 18.21 kg/(m^2).  Physical Exam  Constitutional:  He has become lethargic and at times agitated and combative over the past 48 hours.  Cardiovascular: Regular rhythm.   No murmur heard. Tachycardic.  Pulmonary/Chest: Effort normal and breath sounds normal.  Abdominal: Soft.  Skin: No rash noted.  Sacral pressure sores noted to be "beefy red and clean".    Lab Results Lab Results  Component Value Date   WBC 17.9* 12/18/2015   HGB 7.3* 12/18/2015   HCT 25.2* 12/18/2015   MCV 82.1 12/18/2015   PLT 452* 12/18/2015    Lab Results  Component Value Date   CREATININE 1.57* 12/18/2015   BUN 28* 12/18/2015   NA 137 12/18/2015   K 5.6* 12/18/2015   CL 105 12/18/2015   CO2 25 12/18/2015    Lab Results  Component Value Date   ALT 13* 12/18/2015   AST 21 12/18/2015   ALKPHOS 81 12/18/2015   BILITOT 0.6 12/18/2015     Microbiology: Recent Results (from the past 240 hour(s))  Culture, respiratory (NON-Expectorated)     Status: None   Collection Time: 12/12/15 11:17 AM  Result Value Ref Range Status   Specimen Description TRACHEAL ASPIRATE  Final   Special Requests Normal  Final   Gram Stain   Final    ABUNDANT WBC PRESENT, PREDOMINANTLY PMN FEW SQUAMOUS EPITHELIAL CELLS PRESENT MODERATE GRAM POSITIVE COCCI IN PAIRS FEW GRAM NEGATIVE RODS Performed at [facility]    Culture   Final    FEW PSEUDOMONAS AERUGINOSA Note: COLISTIN 1.5ug/mL SENSITIVE ETEST results for this drug are "FOR INVESTIGATIONAL USE ONLY" and should NOT be used for clinical purposes. MULTI DRUG RESISTANT ORGANISM Note: CRITICAL RESULT CALLED TO, READ BACK BY AND VERIFIED WITH: TEPE RN 130PM 12/18/15 GUSTK Performed at [facility]    Report Status 12/18/2015 FINAL  Final   Organism ID, Bacteria PSEUDOMONAS AERUGINOSA  Final      Susceptibility   Pseudomonas aeruginosa - MIC*    CEFEPIME 8  SENSITIVE Sensitive     CEFTAZIDIME 8 SENSITIVE Sensitive     CIPROFLOXACIN >=4 RESISTANT Resistant     GENTAMICIN >=16 RESISTANT Resistant     IMIPENEM >=16 RESISTANT Resistant     TOBRAMYCIN >=  16 RESISTANT Resistant     PIP/TAZO RESISTANT      TICAR/CLAVULANIC RESISTANT      AMIKACIN <=16 SENSITIVE Sensitive     AZTREONAM >16 RESISTANT Resistant     * FEW PSEUDOMONAS AERUGINOSA  Culture, blood (routine x 2)     Status: None   Collection Time: 12/12/15  1:05 PM  Result Value Ref Range Status   Specimen Description BLOOD LEFT HAND  Final   Special Requests IN PEDIATRIC BOTTLE  1 ML  Final   Culture NO GROWTH 5 DAYS  Final   Report Status 12/17/2015 FINAL  Final  Culture, blood (routine x 2)     Status: None   Collection Time: 12/12/15  7:20 PM  Result Value Ref Range Status   Specimen Description BLOOD LEFT HAND  Final   Special Requests IN PEDIATRIC BOTTLE 2.5CC  Final   Culture NO GROWTH 5 DAYS  Final   Report Status 12/17/2015 FINAL  Final  Culture, Urine     Status: None   Collection Time: 12/13/15  6:27 AM  Result Value Ref Range Status   Specimen Description URINE, CATHETERIZED  Final   Special Requests Normal  Final   Culture >=100,000 COLONIES/mL YEAST  Final   Report Status 12/14/2015 FINAL  Final  Culture, blood (Routine X 2) w Reflex to ID Panel     Status: None (Preliminary result)   Collection Time: 12/18/15  5:05 PM  Result Value Ref Range Status   Specimen Description LEFT ANTECUBITAL  Final   Special Requests BOTTLES DRAWN AEROBIC ONLY 8CC  Final   Culture NO GROWTH < 24 HOURS  Final   Report Status PENDING  Incomplete     ASSESSMENT: Isahia has developed acute renal insufficiency and agitation, both which may be related to colistin. Unfortunately there are no other current options to treat his pseudomonas but I'm not absolutely convinced that his fevers are due to active pneumonia. His mother is in agreement with this plan.  PLAN: 1. Discontinue  colistin 2. Continue fluconazole for now  Michel Bickers, MD [facility] for [place] [phone] pager   [phone] cell 12/19/2015, 11:47 AM

## 2015-12-20 ENCOUNTER — Inpatient Hospital Stay (HOSPITAL_COMMUNITY): Payer: Medicaid Other

## 2015-12-20 LAB — BASIC METABOLIC PANEL
Anion gap: 10 (ref 5–15)
Anion gap: 13 (ref 5–15)
BUN: 33 mg/dL — AB (ref 6–20)
BUN: 37 mg/dL — AB (ref 6–20)
CO2: 29 mmol/L (ref 22–32)
CO2: 25 mmol/L (ref 22–32)
CREATININE: 1.97 mg/dL — AB (ref 0.61–1.24)
Calcium: 10.9 mg/dL — ABNORMAL HIGH (ref 8.9–10.3)
Calcium: 11.2 mg/dL — ABNORMAL HIGH (ref 8.9–10.3)
Chloride: 106 mmol/L (ref 101–111)
Chloride: 106 mmol/L (ref 101–111)
Creatinine, Ser: 2.13 mg/dL — ABNORMAL HIGH (ref 0.61–1.24)
GFR calc Af Amer: 46 mL/min — ABNORMAL LOW (ref >60)
GFR, EST AFRICAN AMERICAN: 50 mL/min — AB (ref >60)
GFR, EST NON AFRICAN AMERICAN: 44 mL/min — AB (ref >60)
GFR, EST NON AFRICAN AMERICAN: 40 mL/min — AB (ref >60)
GLUCOSE: 406 mg/dL — AB (ref 65–99)
Glucose, Bld: 304 mg/dL — ABNORMAL HIGH (ref 65–99)
POTASSIUM: 5.5 mmol/L — AB (ref 3.5–5.1)
Potassium: 5.5 mmol/L — ABNORMAL HIGH (ref 3.5–5.1)
Sodium: 145 mmol/L (ref 135–145)
Sodium: 144 mmol/L (ref 135–145)

## 2015-12-20 LAB — CBC
HCT: 28.8 % — ABNORMAL LOW (ref 39.0–52.0)
Hemoglobin: 8.1 g/dL — ABNORMAL LOW (ref 13.0–17.0)
MCH: 23.5 pg — ABNORMAL LOW (ref 26.0–34.0)
MCHC: 28.1 g/dL — ABNORMAL LOW (ref 30.0–36.0)
MCV: 83.5 fL (ref 78.0–100.0)
PLATELETS: 643 10*3/uL — AB (ref 150–400)
RBC: 3.45 MIL/uL — ABNORMAL LOW (ref 4.22–5.81)
RDW: 16.6 % — AB (ref 11.5–15.5)
WBC: 22.9 10*3/uL — AB (ref 4.0–10.5)

## 2015-12-20 LAB — AMMONIA: Ammonia: 23 umol/L (ref 9–35)

## 2015-12-20 LAB — GLUCOSE, CAPILLARY
GLUCOSE-CAPILLARY: 279 mg/dL — AB (ref 65–99)
GLUCOSE-CAPILLARY: 319 mg/dL — AB (ref 65–99)
GLUCOSE-CAPILLARY: 265 mg/dL — AB (ref 65–99)
GLUCOSE-CAPILLARY: 194 mg/dL — AB (ref 65–99)
Glucose-Capillary: 291 mg/dL — ABNORMAL HIGH (ref 65–99)
Glucose-Capillary: 361 mg/dL — ABNORMAL HIGH (ref 65–99)

## 2015-12-20 LAB — TSH: TSH: 3.344 u[IU]/mL (ref 0.350–4.500)

## 2015-12-20 MED ORDER — PREGABALIN 100 MG PO CAPS
100.0000 mg | ORAL_CAPSULE | Freq: Two times a day (BID) | ORAL | Status: DC
Start: 2015-12-20 — End: 2015-12-21
  Administered 2015-12-20 – 2015-12-21 (×3): 100 mg via ORAL
  Filled 2015-12-20 (×3): qty 1

## 2015-12-20 MED ORDER — VANCOMYCIN HCL 500 MG IV SOLR
500.0000 mg | Freq: Two times a day (BID) | INTRAVENOUS | Status: DC
  Filled 2015-12-20: qty 500

## 2015-12-20 MED ORDER — SODIUM CHLORIDE 0.9 % IV SOLN
500.0000 mg | INTRAVENOUS | Status: DC
  Administered 2015-12-21 – 2015-12-22 (×2): 500 mg via INTRAVENOUS
  Filled 2015-12-20 (×3): qty 500

## 2015-12-20 MED ORDER — VANCOMYCIN HCL 10 G IV SOLR
1250.0000 mg | Freq: Once | INTRAVENOUS | Status: AC
  Administered 2015-12-20: 1250 mg via INTRAVENOUS
  Filled 2015-12-20: qty 1250

## 2015-12-20 MED ORDER — INSULIN ASPART 100 UNIT/ML ~~LOC~~ SOLN
4.0000 [IU] | Freq: Three times a day (TID) | SUBCUTANEOUS | Status: DC
  Administered 2015-12-20 – 2015-12-21 (×4): 4 [IU] via INTRAVENOUS

## 2015-12-20 MED ORDER — SODIUM POLYSTYRENE SULFONATE 15 GM/60ML PO SUSP
30.0000 g | Freq: Once | ORAL | Status: AC
  Administered 2015-12-20: 30 g
  Filled 2015-12-20: qty 120

## 2015-12-20 NOTE — Clinical Documentation Improvement (Signed)
Hospitalist   Would you please help clarify the specific medical condition related to the clinical findings listed below.   Malnutrition - mild  Malnutrition - moderate  Malnutrition - severe  Other condition  Unable to clinically determine   Supporting Information: :  RD has seen pt several times and addresses per intake.  BMI is 18.4.  Pt does have PEG tube.  MD progress note of 12/16/15 notes muscle wasting bilaterally.  Has sacral ulcer with wound vac in place.  Continues to be febrile.   Please exercise your independent, professional judgment when responding. A specific answer is not anticipated or expected.   Thank You, [place] [phone]

## 2015-12-20 NOTE — Progress Notes (Signed)
Inpatient Diabetes Program Recommendations  AACE/ADA: New Consensus Statement on Inpatient Glycemic Control (2015)  Target Ranges:  Prepandial:   less than 140 mg/dL      Peak postprandial:   less than 180 mg/dL (1-2 hours)      Critically ill patients:  140 - 180 mg/dL   Results for DADE, KRUPNICK (MRN [id number]) as of 12/20/2015 07:29  Ref. Range 12/19/2015 01:09 12/19/2015 04:55 12/19/2015 08:14 12/19/2015 11:56 12/19/2015 16:41 12/19/2015 20:18  Glucose-Capillary Latest Ref Range: 65-99 mg/dL 89 146 (H) 174 (H) 183 (H) 272 (H) 259 (H)   Results for AHMADOU, TALBURT (MRN [id number]) as of 12/20/2015 07:29  Ref. Range 12/20/2015 00:18 12/20/2015 03:33  Glucose-Capillary Latest Ref Range: 65-99 mg/dL 291 (H) 279 (H)    Home DM Meds: Lantus 15 units QHS       Novolog 0-10 units QID per SSI  Current Insulin Orders: Lantus 5 units daily      Novolog Sensitive Correction Scale/ SSI (0-9 units) Q4 hours     MD- Patient/Family refused Lantus 5 units yesterday morning (03/13).  Has also been refusing Novolog during the day as well.  Note glucose levels spiked at midnight after tube feeds were started.  Patient did agree to take Novolog at midnight and 4am today.  Perhaps patient would benefit from Novolog tube feed coverage during the time he receives Glucerna tube feeds?   MD- Please consider starting Novolog tube feed coverage between midnight and 12 noon.  Novolog 4 units at midnight, 4am, and 8am.     --Will follow patient during hospitalization--  Wyn Quaker RN, MSN, CDE Diabetes Coordinator Inpatient Glycemic Control Team Team Pager: [phone] (8a-5p)

## 2015-12-20 NOTE — Progress Notes (Signed)
CRITICAL VALUE ALERT  Critical value received:  Postiive Blood Culture- Aerobic bottle- Gram positive cocci in clusters  Date of notification:  12/20/2015  Time of notification:  0835    Critical value read back:Yes.    Nurse who received alert:  Pieter Partridge, Rn  MD notified (1st page):  Dr. Sylvan Cheese  Time of first page:  [phone]  MD notified (2nd page):  Time of second page:  Responding MD:  Dr. Sylvan Cheese  Time MD responded:  [phone]

## 2015-12-20 NOTE — Progress Notes (Signed)
Pharmacy Antibiotic Note  Jason [name] is a 30 y.o. male admitted on 12/06/2015 with multiple comorbidities.  Pharmacy has been consulted for vancomycin dosing for a potentially new GPC bacteremia. Recently finished course of abx for HCAP. Has elevated white count, afebrile. Pt is quad with trach, now with renal insufficiency, may be difficult to predict adequate vancomycin dose.  Plan: Vancomycin 1250 mg IV x1 then 500 mg IV q24h Monitor renal fx VT at Css Watch cultures  Height: 5\' 8"  (172.7 cm) Weight: 119 lb 14.9 oz (54.4 kg) IBW/kg (Calculated) : 68.4  Temp (24hrs), Avg:99 F (37.2 C), Min:96.5 F (35.8 C), Max:101.9 F (38.8 C)   Recent Labs Lab 12/14/15 1116 12/15/15 0256 12/17/15 0359 12/18/15 2310 12/20/15 0418  WBC 21.6* 18.6* 15.7* 17.9* 22.9*  CREATININE 0.96 0.89 0.78 1.57* 1.97*  LATICACIDVEN  --   --   --  1.0  --     Estimated Creatinine Clearance: 41.8 mL/min (by C-G formula based on Cr of 1.97).    Allergies  Allergen Reactions  . Cefuroxime Axetil Anaphylaxis  . Ertapenem Other (See Comments)    Rash and confusion-->tolerated Imipenem   . Morphine And Related Other (See Comments)    Changed mental status, confusion, headache, visual hallucination  . Penicillins Anaphylaxis and Other (See Comments)    Tolerated Imipenem; no reaction to 7 day course of amoxicillin in 2015 Has patient had a PCN reaction causing immediate rash, facial/tongue/throat swelling, SOB or lightheadedness with hypotension: Yes Has patient had a PCN reaction causing severe rash involving mucus membranes or skin necrosis: No Has patient had a PCN reaction that required hospitalization Yes Has patient had a PCN reaction occurring within the last 10 years: No If all of the above answers are "NO", then may proceed with Cephalo  . Sulfa Antibiotics Anaphylaxis, Shortness Of Breath and Other (See Comments)  . Tessalon [Benzonatate] Anaphylaxis  . Shellfish Allergy Itching and  Other (See Comments)    Took benadryl to alleviate reaction  . Levaquin [Levofloxacin In D5w] Swelling    Hand and forearm swelling  . Nsaids Itching and Other (See Comments)    Risk of bleeding, itching  . Miripirium Rash and Other (See Comments)    Change in mental status    Antimicrobials this admission: Vanc 3/1 >> 3/7, 3/14 > Levaquin 3/1 >> 3/7 Primaxin x 1 2/28 Aztreonam 3/7 >> 3/9 Tigecycline 3/7 >> 3/9 Colistin 3/9 >> 3/12 Fluc 3/9 >>   Dose adjustments this admission: 3/2 VT 19  Microbiology results: 2/28 UCx >> mult species FINAL 2/28 BCx x2>> ngF 3/1 BCx x2>> ngF 3/1 MRSA PCR >> positive 3/6 TA - Pseudomonas 3/6 blood x 2: NF 3/7 Urine: > 100K yeast 3/13 cdiff: neg 3/12 blood cx: GPC   Thank you for allowing pharmacy to be a part of this patient's care.  Harvel Quale 12/20/2015 11:45 AM

## 2015-12-20 NOTE — Progress Notes (Signed)
PULMONARY / CRITICAL CARE MEDICINE   Name: Jason [name] MRN: [id number] DOB: 1986-07-06    ADMISSION DATE:  12/06/2015 CONSULTATION DATE:  12/06/15  REFERRING MD:  EDP  CHIEF COMPLAINT:  Decrease in O2 sats, fever, PNA  SUBJECTIVE:  Has been somnolent and altered since 12/19/15.  Per mother, pt has been sleeping much more than usual and "not himself".  Seems to have "blank starring spells" whenever he wakes up. Blood cultures from 03/12 with GPC's 1/2 bottles.  VITAL SIGNS: BP 122/81 mmHg  Pulse 97  Temp(Src) 98.4 F (36.9 C) (Oral)  Resp 13  Ht 5\' 8"  (1.727 m)  Wt 54.4 kg (119 lb 14.9 oz)  BMI 18.24 kg/m2  SpO2 99%   VENTILATOR SETTINGS: Vent Mode:  [-]  FiO2 (%):  [28 %-40 %] 28 %  INTAKE / OUTPUT: I/O last 3 completed shifts: In: 3180 [NG/GT:2080; IV Piggyback:1100] Out: 3805 [Urine:2005; Stool:1800]   PHYSICAL EXAMINATION: General: Chronically ill --> now acutely encephalopathic  Neuro: Opens eyes to stimuli but falls back to sleep quickly. HEENT: PERRL, Clean trach site. Cardiovascular: RRR, no MRG Lungs: Quiet breathing through trach/PMV, faint rhonchi. Abdomen: PEG in place, + BS, ostomy Musculoskeletal: Muscle wasting bilaterally. Skin: No rashes. Sacral wound vac in place.   LABS:  BMET  Recent Labs Lab 12/17/15 0359 12/18/15 2310 12/20/15 0418  NA 135 137 145  K 6.0* 5.6* 5.5*  CL 104 105 106  CO2 25 25 29   BUN 31* 28* 33*  CREATININE 0.78 1.57* 1.97*  GLUCOSE 131* 104* 304*    Electrolytes  Recent Labs Lab 12/17/15 0359 12/18/15 2310 12/20/15 0418  CALCIUM 9.3 9.6 10.9*    CBC  Recent Labs Lab 12/17/15 0359 12/18/15 2310 12/20/15 0418  WBC 15.7* 17.9* 22.9*  HGB 7.7* 7.3* 8.1*  HCT 26.1* 25.2* 28.8*  PLT 501* 452* 643*    Coag's No results for input(s): APTT, INR in the last 168 hours.  Sepsis Markers  Recent Labs Lab 12/18/15 2310  LATICACIDVEN 1.0    ABG  Recent Labs Lab 12/18/15 2355  12/19/15 0955  PHART 7.324* 7.336*  PCO2ART 49.0* 48.8*  PO2ART 72.9* 93.2    Liver Enzymes  Recent Labs Lab 12/18/15 2310  AST 21  ALT 13*  ALKPHOS 81  BILITOT 0.6  ALBUMIN 2.0*    Cardiac Enzymes No results for input(s): TROPONINI, PROBNP in the last 168 hours.  Glucose  Recent Labs Lab 12/19/15 1156 12/19/15 1641 12/19/15 2018 12/20/15 0018 12/20/15 0333 12/20/15 0832  GLUCAP 183* 272* 259* 291* 279* 319*    Imaging Dg Chest Port 1 View  12/20/2015  CLINICAL DATA:  Respiratory failure EXAM: PORTABLE CHEST 1 VIEW COMPARISON:  December 15, 2015 FINDINGS: Tracheostomy catheter tip is 4.8 cm above the carina. Central catheter tip is in the superior vena cava near the cavoatrial junction. There is no appreciable pneumothorax. There is airspace consolidation in the medial right base with small right effusion. Left lung is clear. Heart is upper normal in size with pulmonary vascularity within normal limits. No adenopathy. There is lumbar levoscoliosis. IMPRESSION: Tube and catheter positions as described without pneumothorax. Consolidation medial right base with small right effusion. Left lung clear. No change in cardiac silhouette. Electronically Signed   By: Lowella Grip III M.D.   On: 12/20/2015 07:37     STUDIES:  CXR 02/28 > bibasilar airspace densities concerning for PNA. CT abdo/ pelvis>>>no fistula CXR 3/7 >>mildly increased bibasilar opacities are noted most consistent with  subsegmental atelectasis and associated pleural effusions.  EEG 03/13 > mild to mod diffuse slowing with triphasic waves (could be seen with hypoxic/ischemic injury, toxic/metabolic encephalopathies (hepatic?), med effect).  CULTURES: Blood 02/28 >>No growth  Urine 02/28 >>Multiple species, suggest recollection Blood 3/1>> No growth  Sputum 3/6 >>> few pseudomonas >>>MDR Blood Culture 3/6 >> neg Urine Culture 3/7 >>Yeast  MRSA pos screen Blood 03/12 > GPC's >   ANTIBIOTICS: Vanc  02/28 (7/7) >> 3/7 Levaquin 02/28 (7/7) >>3/7 Tigecycline 3/7 >> 3/9 Aztreonam 3/7 >> 3/9 Colistin 3/9 >> 3/13 Fluconazole 03/9 > Vanc 03/14 >   SIGNIFICANT EVENTS: 2/28  Admitted with probable HCAP 3/2- no fevers, low secretions, remains vented, refusing Chest PT and other treatments 3/3 improved to TC, pcxr better 3/5- trach collar 24  Hrs 3/6- Tmax 103.2 3/8 still spiking temps  3/9 Colistin started.  3/13: encephalopathic, rising creatinine, hyperkalemic.  3/14 > remains encephalopathic.  1/2 blood cx's with GPC's.    LINES/TUBES: Chronic trach / PEG >> Suprapubic catheter changed 3/8  ASSESSMENT / PLAN:  PULMONARY A: Chronic respiratory failure with trach dependence - repeat ABG 03/13 with mild resp acidosis (not significant enough to warrant placing pt back on vent) Tracheostomy status HCAP P:  Continue ATC as tolerated. PMV as tolerated. See ID section. Chest PT. Mobilize. CXR intermittently.  CARDIOVASCULAR A:  Chronic hypotension - on midodrine P:  Continue midodrine (dose adjusted up). Monitor hemodynamics.  RENAL A:   New AKI and hyperkalemia --> was hypotensive but also on Colistin NONAG acidosis resolving 3/4 P:   Additional kayexalate today. F/u BMP this afternoon. Renal dose meds.  BMP in AM.  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 TF's at goal.  HEMATOLOGIC A:   Anemia - chronic, some dilution now Thrombocytosis - chronic VTE Prophylaxis P:  Transfuse for Hgb < 7. SCD's / heparin.  INFECTIOUS A:   Bacteremia - 1/2 blood cultures from 03/12 with GPC's.  Also with increasing leukocytosis as of 03/14 UTI - hx pseudomonal UTI in past with resistance to multiple abx - positive for yeast  HCAP-->pseudomonas MDR (S) colistin w/ lack of current pulmonary symptoms wonder if this is more colonization Hx sacral wounds P:   ID following - appreciate the  assistance. Continue to hold colistin. Vanc added back 03/14. Continue diflucan. WOC following.  ENDOCRINE A:   DM brittle - per DM coordinator note, pt and family refused lantus 03/13.  Have recommended changes to novolog to include tube fee coverage > added 03/14. Repeated episodes hypoglycemia-->better w/ reduced lantus Adrenal insuff - cortisol 12.7 but no need for additional steroids for now. P:   Continue SSI - tube coverage added 03/14. Lantus reduced to 5 units  started 3/13 because of repeated lows. Continue sensitive scale.  NEUROLOGIC A:   Acute encephalopathy - EEG 03/13 with mild to mod diffuse slowing with triphasic waves (could be seen with hypoxic/ischemic injury, toxic/metabolic encephalopathies (hepatic?), med effect). Hx seizures (not on meds), paraplegia, fibromyalgia, diabetic neuropathy P:   Continue outpatient oxycodone, bupropion, lyrica, ativan (per mother, pt really needs these or his pain and tremors get worse). Would avoid benadryl (received 03/13 and was very somnolent after). Assess ammonia, TSH.   Family updated: Mother at bedside and updated daily  CC time: 30 minutes.   Montey Hora, [facility] Pager: [phone]  or [phone]  [phone] 12/20/2015, 11:29 AM

## 2015-12-20 NOTE — Progress Notes (Signed)
Pt continues to be encephelopathic. Eyes are open, but he appears completely disoriented, not trying to speak/communicate, attempted to hit this RN, and not following commands. This is extremely different from baseline as we are all aware. BP 130's.  Temp of 101.9 @ 0000, ice packs applied. RN will continue to monitor.

## 2015-12-20 NOTE — Progress Notes (Signed)
Patient ID: Jason [name], male   DOB: 11/30/1985, 30 y.o.   MRN: [id number]           Jason [name] for Infectious Disease    Date of Admission:  12/06/2015   Total days of antibiotics 14        Day 6 fluconazole        Day 1 vancomycin         Principal Problem:   HCAP (healthcare-associated pneumonia) Active Problems:   Acute on chronic respiratory failure ([name])   Recurrent UTI   Pressure ulcer   Neurogenic bladder   Uncontrolled type 1 diabetes mellitus with hyperglycemia ([name])   Respiratory failure, chronic ([name])   Paraplegia ([name])   Acute respiratory failure ([name])   Pneumonia   Fistula   Pyrexia   Healthcare-associated pneumonia   Abdominal distension   Fever   . antiseptic oral rinse  7 mL Mouth Rinse QID  . baclofen  20 mg Oral Q6H  . buPROPion  150 mg Oral Daily  . chlorhexidine gluconate  15 mL Mouth Rinse BID  . fentaNYL  50 mcg Transdermal Q72H  . fluconazole  100 mg Oral Daily  . heparin  5,000 Units Subcutaneous 3 times per day  . insulin aspart  0-9 Units Subcutaneous 6 times per day  . insulin aspart  4 Units Intravenous TID WC  . insulin glargine  5 Units Subcutaneous Daily  . midodrine  40 mg Per Tube TID WC  . naLOXone (NARCAN)  injection  0.4 mg Intramuscular Once  . pantoprazole sodium  40 mg Per Tube Daily  . pregabalin  100 mg Oral BID  . sodium chloride flush  10-40 mL Intracatheter Q12H  . sodium polystyrene  30 g Per Tube Once  . [START ON 12/21/2015] vancomycin  500 mg Intravenous Q24H   Review of Systems: Review of Systems  Unable to perform ROS: mental acuity    Past Medical History  Diagnosis Date  . GERD (gastroesophageal reflux disease)   . Asthma   . Hx MRSA infection     on face  . Gastroparesis   . Diabetic neuropathy ([name])   . Seizures ([name])   . Family history of anesthesia complication     Pt mother can't have epidural procedures  . Dysrhythmia   . Pneumonia   . Arthritis   . Fibromyalgia   . 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 Type I diabetes mellitus ([name])     sees Dr. Loanne [name]   . Syncope 02/16/2015  . Multiple allergies 11/02/2015  . Recurrent UTI 11/02/2015    Social History  Substance Use Topics  . Smoking status: Former Smoker -- 1.00 packs/day for 12 years    Types: Cigars, Cigarettes    Quit date: 09/07/2014  . Smokeless tobacco: Never Used  . Alcohol Use: No    Family History  Problem Relation Age of Onset  . Diabetes Father   . Hypertension Father   . Asthma      fhx  . Hypertension      fhx  . Stroke      fhx  . Heart disease Mother    Allergies  Allergen Reactions  . Cefuroxime Axetil Anaphylaxis  . Ertapenem Other (See Comments)    Rash and confusion-->tolerated Imipenem   . Morphine And Related Other (See Comments)    Changed mental status, confusion, headache, visual hallucination  . Penicillins Anaphylaxis and Other (See  Comments)    Tolerated Imipenem; no reaction to 7 day course of amoxicillin in 2015 Has patient had a PCN reaction causing immediate rash, facial/tongue/throat swelling, SOB or lightheadedness with hypotension: Yes Has patient had a PCN reaction causing severe rash involving mucus membranes or skin necrosis: No Has patient had a PCN reaction that required hospitalization Yes Has patient had a PCN reaction occurring within the last 10 years: No If all of the above answers are "NO", then may proceed with Cephalo  . Sulfa Antibiotics Anaphylaxis, Shortness Of Breath and Other (See Comments)  . Tessalon [Benzonatate] Anaphylaxis  . Shellfish Allergy Itching and Other (See Comments)    Took benadryl to alleviate reaction  . Levaquin [Levofloxacin In D5w] Swelling    Hand and forearm swelling  . Nsaids Itching and Other (See Comments)    Risk of bleeding, itching  . Miripirium Rash and Other (See Comments)    Change in mental status    OBJECTIVE: Filed Vitals:   12/20/15 0900 12/20/15 1000 12/20/15 1100  12/20/15 1200  BP: 117/76 112/70 103/70 101/75  Pulse: 97 95 94 90  Temp:    99.3 F (37.4 C)  TempSrc:    Axillary  Resp: 17 15 16 15   Height:      Weight:      SpO2: 96% 95% 97% 99%   Body mass index is 18.24 kg/(m^2).  Physical Exam  Constitutional:  He is more alert and less combative today..  Cardiovascular: Regular rhythm.   No murmur heard. Tachycardic.  Pulmonary/Chest: Effort normal and breath sounds normal.  Abdominal: Soft.  Skin: No rash noted.  Sacral pressure sores noted to be "beefy red and clean".    Lab Results Lab Results  Component Value Date   WBC 22.9* 12/20/2015   HGB 8.1* 12/20/2015   HCT 28.8* 12/20/2015   MCV 83.5 12/20/2015   PLT 643* 12/20/2015    Lab Results  Component Value Date   CREATININE 1.97* 12/20/2015   BUN 33* 12/20/2015   NA 145 12/20/2015   K 5.5* 12/20/2015   CL 106 12/20/2015   CO2 29 12/20/2015    Lab Results  Component Value Date   ALT 13* 12/18/2015   AST 21 12/18/2015   ALKPHOS 81 12/18/2015   BILITOT 0.6 12/18/2015     Microbiology: Recent Results (from the past 240 hour(s))  Culture, respiratory (NON-Expectorated)     Status: None   Collection Time: 12/12/15 11:17 AM  Result Value Ref Range Status   Specimen Description TRACHEAL ASPIRATE  Final   Special Requests Normal  Final   Gram Stain   Final    ABUNDANT WBC PRESENT, PREDOMINANTLY PMN FEW SQUAMOUS EPITHELIAL CELLS PRESENT MODERATE GRAM POSITIVE COCCI IN PAIRS FEW GRAM NEGATIVE RODS Performed at [facility]    Culture   Final    FEW PSEUDOMONAS AERUGINOSA Note: COLISTIN 1.5ug/mL SENSITIVE ETEST results for this drug are "FOR INVESTIGATIONAL USE ONLY" and should NOT be used for clinical purposes. MULTI DRUG RESISTANT ORGANISM Note: CRITICAL RESULT CALLED TO, READ BACK BY AND VERIFIED WITH: TEPE RN 130PM 12/18/15 GUSTK Performed at [facility]    Report Status 12/18/2015 FINAL  Final   Organism ID, Bacteria PSEUDOMONAS AERUGINOSA   Final      Susceptibility   Pseudomonas aeruginosa - MIC*    CEFEPIME 8 SENSITIVE Sensitive     CEFTAZIDIME 8 SENSITIVE Sensitive     CIPROFLOXACIN >=4 RESISTANT Resistant     GENTAMICIN >=16 RESISTANT  Resistant     IMIPENEM >=16 RESISTANT Resistant     TOBRAMYCIN >=16 RESISTANT Resistant     PIP/TAZO RESISTANT      TICAR/CLAVULANIC RESISTANT      AMIKACIN <=16 SENSITIVE Sensitive     AZTREONAM >16 RESISTANT Resistant     * FEW PSEUDOMONAS AERUGINOSA  Culture, blood (routine x 2)     Status: None   Collection Time: 12/12/15  1:05 PM  Result Value Ref Range Status   Specimen Description BLOOD LEFT HAND  Final   Special Requests IN PEDIATRIC BOTTLE  1 ML  Final   Culture NO GROWTH 5 DAYS  Final   Report Status 12/17/2015 FINAL  Final  Culture, blood (routine x 2)     Status: None   Collection Time: 12/12/15  7:20 PM  Result Value Ref Range Status   Specimen Description BLOOD LEFT HAND  Final   Special Requests IN PEDIATRIC BOTTLE 2.5CC  Final   Culture NO GROWTH 5 DAYS  Final   Report Status 12/17/2015 FINAL  Final  Culture, Urine     Status: None   Collection Time: 12/13/15  6:27 AM  Result Value Ref Range Status   Specimen Description URINE, CATHETERIZED  Final   Special Requests Normal  Final   Culture >=100,000 COLONIES/mL YEAST  Final   Report Status 12/14/2015 FINAL  Final  Culture, blood (Routine X 2) w Reflex to ID Panel     Status: None (Preliminary result)   Collection Time: 12/18/15  5:05 PM  Result Value Ref Range Status   Specimen Description LEFT ANTECUBITAL  Final   Special Requests BOTTLES DRAWN AEROBIC ONLY 8CC  Final   Culture  Setup Time   Final    GRAM POSITIVE COCCI IN CLUSTERS AEROBIC BOTTLE ONLY CRITICAL RESULT CALLED TO, READ BACK BY AND VERIFIED WITH: STEPHANIE VIVERITO,KRN AT [id number] 12/20/15 BY K BARR    Culture NO GROWTH 2 DAYS  Final   Report Status PENDING  Incomplete  C difficile quick scan w PCR reflex     Status: None   Collection Time:  12/19/15  2:00 PM  Result Value Ref Range Status   C Diff antigen NEGATIVE NEGATIVE Final   C Diff toxin NEGATIVE NEGATIVE Final   C Diff interpretation Negative for toxigenic C. difficile  Final     ASSESSMENT: Component remains febrile and 1 of one blood culture from 12/18/2015 is growing gram-positive cocci in clusters. I agree with covering with vancomycin until we get a better idea of whether or not this is a contaminant or represents true bacteremia. He still has some basilar infiltrate on chest x-ray but clinically there is not much to suggest active pneumonia. I suspect that his recent decline in mental status and renal insufficiency were caused by colistin which I stopped yesterday. He remains at very high risk for all healthcare associated infections. 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 All of this makes it increasingly likely that he will eventually succumb to an untreatable infection.  PLAN: 1. Continue fluconazole and vancomycin for now  Michel Bickers, MD [facility] [place] [phone] pager   [phone] cell 12/20/2015, 1:22 PM

## 2015-12-21 LAB — BASIC METABOLIC PANEL
ANION GAP: 13 (ref 5–15)
BUN: 41 mg/dL — ABNORMAL HIGH (ref 6–20)
CALCIUM: 11.7 mg/dL — AB (ref 8.9–10.3)
CO2: 25 mmol/L (ref 22–32)
Chloride: 108 mmol/L (ref 101–111)
Creatinine, Ser: 2.27 mg/dL — ABNORMAL HIGH (ref 0.61–1.24)
GFR, EST AFRICAN AMERICAN: 43 mL/min — AB (ref >60)
GFR, EST NON AFRICAN AMERICAN: 37 mL/min — AB (ref >60)
GLUCOSE: 382 mg/dL — AB (ref 65–99)
POTASSIUM: 4.9 mmol/L (ref 3.5–5.1)
Sodium: 146 mmol/L — ABNORMAL HIGH (ref 135–145)

## 2015-12-21 LAB — GLUCOSE, CAPILLARY
GLUCOSE-CAPILLARY: 160 mg/dL — AB (ref 65–99)
GLUCOSE-CAPILLARY: 182 mg/dL — AB (ref 65–99)
GLUCOSE-CAPILLARY: 223 mg/dL — AB (ref 65–99)
GLUCOSE-CAPILLARY: 297 mg/dL — AB (ref 65–99)
Glucose-Capillary: 280 mg/dL — ABNORMAL HIGH (ref 65–99)
Glucose-Capillary: 296 mg/dL — ABNORMAL HIGH (ref 65–99)

## 2015-12-21 LAB — CBC
HCT: 28.5 % — ABNORMAL LOW (ref 39.0–52.0)
Hemoglobin: 8.3 g/dL — ABNORMAL LOW (ref 13.0–17.0)
MCH: 24.6 pg — ABNORMAL LOW (ref 26.0–34.0)
MCHC: 29.1 g/dL — ABNORMAL LOW (ref 30.0–36.0)
MCV: 84.6 fL (ref 78.0–100.0)
PLATELETS: 713 10*3/uL — AB (ref 150–400)
RBC: 3.37 MIL/uL — AB (ref 4.22–5.81)
RDW: 16.5 % — AB (ref 11.5–15.5)
WBC: 27.3 10*3/uL — AB (ref 4.0–10.5)

## 2015-12-21 LAB — MAGNESIUM: MAGNESIUM: 1.5 mg/dL — AB (ref 1.7–2.4)

## 2015-12-21 LAB — PHOSPHORUS: PHOSPHORUS: 3.6 mg/dL (ref 2.5–4.6)

## 2015-12-21 MED ORDER — FREE WATER
200.0000 mL | Freq: Four times a day (QID) | Status: DC
  Administered 2015-12-21 – 2016-01-02 (×48): 200 mL

## 2015-12-21 MED ORDER — BACLOFEN 1 MG/ML ORAL SUSPENSION
10.0000 mg | Freq: Four times a day (QID) | ORAL | Status: DC
  Administered 2015-12-21 – 2015-12-22 (×4): 10 mg via ORAL
  Filled 2015-12-21 (×5): qty 1

## 2015-12-21 MED ORDER — PREGABALIN 25 MG PO CAPS
50.0000 mg | ORAL_CAPSULE | Freq: Two times a day (BID) | ORAL | Status: DC
  Administered 2015-12-21: 50 mg via ORAL
  Filled 2015-12-21: qty 2

## 2015-12-21 MED ORDER — SODIUM CHLORIDE 0.9 % IV SOLN
INTRAVENOUS | Status: DC
  Administered 2015-12-21 – 2015-12-24 (×5): via INTRAVENOUS
  Administered 2015-12-25: 1000 mL via INTRAVENOUS
  Administered 2015-12-26 – 2015-12-29 (×5): via INTRAVENOUS
  Administered 2015-12-30: 10 mL/h via INTRAVENOUS
  Administered 2016-01-02: 23:00:00 via INTRAVENOUS

## 2015-12-21 MED ORDER — MAGNESIUM SULFATE 2 GM/50ML IV SOLN
2.0000 g | Freq: Once | INTRAVENOUS | Status: AC
  Administered 2015-12-21: 2 g via INTRAVENOUS
  Filled 2015-12-21: qty 50

## 2015-12-21 MED ORDER — INSULIN ASPART 100 UNIT/ML ~~LOC~~ SOLN
4.0000 [IU] | Freq: Three times a day (TID) | SUBCUTANEOUS | Status: DC
  Administered 2015-12-21 – 2015-12-23 (×5): 4 [IU] via SUBCUTANEOUS

## 2015-12-21 NOTE — Consult Note (Addendum)
WOC wound follow up:  Vac has maintained a seal since Mon. Re-applied 3 Vac dressings leaving the rectum area free from dressings, in hopes that suction remain in place if another stool occurs. 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 Rectum area is free from dressings and Flexiseal is intact. Pt tolerated without pain. Attached Y connector to one machine with cont suction on at 190mm. Plan for dressing change on Fri. If stooling causes Vac to loose seal, then orders provided for the staff nurse to apply moist gauze dressings until [place] can re-apply the Vac dressings. He can resume use of his own negative pressure device after discharge. One hour spent performing this complex Vac dressing. Mother at bedside to discuss plan of care.  Ostomy pouch changed, stoma red and viable, 1 inch and flush with skin level, stool tan and too thick to go through the previous  pouch with a spout which was applied yesterday.Applied 2 piece colostomy pouch and extra supplies left at the bedside for staff nurse use. Julien Girt MSN, RN, [place], [place], [place]        Julien Girt MSN, [place], [place], [place], [place]

## 2015-12-21 NOTE — Progress Notes (Signed)
[facility] Progress Note Patient Name: Jason [name] DOB: Sep 11, 1986 MRN: [id number]   Date of Service  12/21/2015  HPI/Events of Note  Patient getting Novolog 4 units IV with meals.  eICU Interventions  Will change Novolog to 4 units [place] with meals.      Intervention Category Intermediate Interventions: Hyperglycemia - evaluation and treatment  Linetta Regner [place] 12/21/2015, 3:20 PM

## 2015-12-21 NOTE — Progress Notes (Signed)
PULMONARY / CRITICAL CARE MEDICINE   Name: Jason [name] MRN: [id number] DOB: 1986/07/05    ADMISSION DATE:  12/06/2015 CONSULTATION DATE:  12/06/15  REFERRING MD:  EDP  CHIEF COMPLAINT:  Decrease in O2 sats, fever, PNA  SUBJECTIVE:  Has been somnolent and altered since 12/19/15.  Per mother, pt has been sleeping much more than usual and "not himself".  Seems to have "blank starring spells" whenever he wakes up.  VITAL SIGNS: BP 117/83 mmHg  Pulse 79  Temp(Src) 97.8 F (36.6 C) (Axillary)  Resp 14  Ht 5\' 8"  (1.727 m)  Wt 119 lb 11.4 oz (54.3 kg)  BMI 18.21 kg/m2  SpO2 99%   VENTILATOR SETTINGS: Vent Mode:  [-]  FiO2 (%):  [28 %] 28 %  INTAKE / OUTPUT: I/O last 3 completed shifts: In: 2730 [I.V.:10; NG/GT:2470; IV Piggyback:250] Out: 3165 [Urine:1615; Stool:1550]   PHYSICAL EXAMINATION: General: Chronically ill --> now acutely encephalopathic  Neuro: Opens eyes to stimuli but falls back to sleep quickly. HEENT: PERRL, Clean trach site. Cardiovascular: RRR, no MRG Lungs: Quiet breathing through trach/PMV, faint rhonchi. Abdomen: PEG in place, + BS, ostomy Musculoskeletal: Muscle wasting bilaterally. Skin: No rashes. Sacral wound vac in place.   LABS:  BMET  Recent Labs Lab 12/20/15 0418 12/20/15 1531 12/21/15 0527  NA 145 144 146*  K 5.5* 5.5* 4.9  CL 106 106 108  CO2 29 25 25   BUN 33* 37* 41*  CREATININE 1.97* 2.13* 2.27*  GLUCOSE 304* 406* 382*    Electrolytes  Recent Labs Lab 12/20/15 0418 12/20/15 1531 12/21/15 0527  CALCIUM 10.9* 11.2* 11.7*  MG  --   --  1.5*  PHOS  --   --  3.6    CBC  Recent Labs Lab 12/18/15 2310 12/20/15 0418 12/21/15 0527  WBC 17.9* 22.9* 27.3*  HGB 7.3* 8.1* 8.3*  HCT 25.2* 28.8* 28.5*  PLT 452* 643* 713*    Coag's No results for input(s): APTT, INR in the last 168 hours.  Sepsis Markers  Recent Labs Lab 12/18/15 2310  LATICACIDVEN 1.0    ABG  Recent Labs Lab 12/18/15 2355  12/19/15 0955  PHART 7.324* 7.336*  PCO2ART 49.0* 48.8*  PO2ART 72.9* 93.2    Liver Enzymes  Recent Labs Lab 12/18/15 2310  AST 21  ALT 13*  ALKPHOS 81  BILITOT 0.6  ALBUMIN 2.0*    Cardiac Enzymes No results for input(s): TROPONINI, PROBNP in the last 168 hours.  Glucose  Recent Labs Lab 12/20/15 0832 12/20/15 1145 12/20/15 1701 12/20/15 2013 12/20/15 2347 12/21/15 0400  GLUCAP 319* 361* 265* 194* 160* 280*    Imaging No results found.   STUDIES:  CXR 02/28 > bibasilar airspace densities concerning for PNA. CT abdo/ pelvis>>>no fistula CXR 3/7 >>mildly increased bibasilar opacities are noted most consistent with subsegmental atelectasis and associated pleural effusions.  EEG 03/13 > mild to mod diffuse slowing with triphasic waves (could be seen with hypoxic/ischemic injury, toxic/metabolic encephalopathies (hepatic?), med effect). CXR 3/14 >> Consolidation medial right base with small right effusion, left lung clear  CULTURES: Blood 02/28 >>No growth  Urine 02/28 >>Multiple species, suggest recollection Blood 3/1>> No growth  Sputum 3/6 >>> few pseudomonas >>>MDR Blood Culture 3/6 >> neg Urine Culture 3/7 >>Yeast  MRSA pos screen Blood 03/12 > GPC's > CDiff 3/13 >> Negative  Blood 3/14 >>   ANTIBIOTICS: Vanc 02/28 (7/7) >> 3/7 Levaquin 02/28 (7/7) >>3/7 Tigecycline 3/7 >> 3/9 Aztreonam 3/7 >> 3/9 Colistin 3/9 >>  3/13 Fluconazole 03/9 > Vanc 03/14 >   SIGNIFICANT EVENTS: 2/28  Admitted with probable HCAP 3/2- no fevers, low secretions, remains vented, refusing Chest PT and other treatments 3/3 improved to TC, pcxr better 3/5- trach collar 24  Hrs 3/6- Tmax 103.2 3/8 still spiking temps  3/9 Colistin started.  3/13: encephalopathic, rising creatinine, hyperkalemic.  3/14 > remains encephalopathic.  1/2 blood cx's with GPC's.    LINES/TUBES: Chronic trach / PEG >> Suprapubic catheter changed 3/8  ASSESSMENT /  PLAN:  PULMONARY A: Chronic respiratory failure with trach dependence - repeat ABG 03/13 with mild resp acidosis (not significant enough to warrant placing pt back on vent) Tracheostomy status HCAP P:  Continue ATC as tolerated. PMV as tolerated. See ID section. Chest PT. Mobilize. CXR intermittently.  CARDIOVASCULAR A:  Chronic hypotension - on midodrine P:  Continue midodrine (dose adjusted up). Monitor hemodynamics.  RENAL A:   New AKI and hyperkalemia --> was hypotensive but also on Colistin NONAG acidosis resolving 3/4 Hypomagnesium  P:   Add NS @ 75ml/hr Increase free water to 257ml every 6 hours Replaced 2gm IV mag Renal dose meds.  BMP in AM.  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 TF's at goal.  HEMATOLOGIC A:   Anemia - chronic, some dilution now Thrombocytosis - chronic VTE Prophylaxis P:  Transfuse for Hgb < 7. SCD's / heparin.  INFECTIOUS A:   Bacteremia - 1/2 blood cultures from 03/12 with GPC's.  Also with increasing leukocytosis as of 03/14 UTI - hx pseudomonal UTI in past with resistance to multiple abx - positive for yeast  HCAP-->pseudomonas MDR (S) colistin w/ lack of current pulmonary symptoms wonder if this is more colonization Hx sacral wounds P:   ID following - appreciate the assistance. Continue to hold colistin. Vanc added back 03/14. Continue diflucan. WOC following.  ENDOCRINE A:   DM brittle - per DM coordinator note, pt and family refused lantus 03/13.  Have recommended changes to novolog to include tube fee coverage > added 03/14. Repeated episodes hypoglycemia-->better w/ reduced lantus Adrenal insuff - cortisol 12.7 but no need for additional steroids for now. P:   Continue SSI - tube coverage added 03/14. Lantus reduced to 5 units  started 3/13 because of repeated lows. Continue sensitive scale.  NEUROLOGIC A:   Acute encephalopathy - EEG  03/13 with mild to mod diffuse slowing with triphasic waves (could be seen with hypoxic/ischemic injury, toxic/metabolic encephalopathies (hepatic?), med effect). Ammonia, TSH negative Hx seizures (not on meds), paraplegia, fibromyalgia, diabetic neuropathy P:   Decrease Lyrica and Baclofen ns Would avoid benadryl (received 03/13 and was very somnolent after).  Family updated: Mother at bedside and updated daily  Erick Colace ACNP-BC [place] Pager # [phone] OR # [phone] if no answer

## 2015-12-21 NOTE — Progress Notes (Signed)
Patient ID: Jason [name], male   DOB: 1986/07/25, 30 y.o.   MRN: [id number]           [facility] for Infectious Disease    Date of Admission:  12/06/2015   Total days of antibiotics 15        Day 7 fluconazole        Day 2 vancomycin         Principal Problem:   HCAP (healthcare-associated pneumonia) Active Problems:   Acute on chronic respiratory failure ([name])   Recurrent UTI   Pressure ulcer   Neurogenic bladder   Uncontrolled type 1 diabetes mellitus with hyperglycemia ([name])   Respiratory failure, chronic ([name])   Paraplegia ([name])   Acute respiratory failure ([name])   Pneumonia   Fistula   Pyrexia   Healthcare-associated pneumonia   Abdominal distension   Fever   . antiseptic oral rinse  7 mL Mouth Rinse QID  . baclofen  10 mg Oral Q6H  . buPROPion  150 mg Oral Daily  . chlorhexidine gluconate  15 mL Mouth Rinse BID  . fentaNYL  50 mcg Transdermal Q72H  . fluconazole  100 mg Oral Daily  . free water  200 mL Per Tube Q6H  . heparin  5,000 Units Subcutaneous 3 times per day  . insulin aspart  0-9 Units Subcutaneous 6 times per day  . insulin aspart  4 Units Intravenous TID WC  . insulin glargine  5 Units Subcutaneous Daily  . magnesium sulfate 1 - 4 g bolus IVPB  2 g Intravenous Once  . midodrine  40 mg Per Tube TID WC  . naLOXone (NARCAN)  injection  0.4 mg Intramuscular Once  . pantoprazole sodium  40 mg Per Tube Daily  . pregabalin  50 mg Oral BID  . sodium chloride flush  10-40 mL Intracatheter Q12H  . vancomycin  500 mg Intravenous Q24H   Review of Systems: Review of Systems  Unable to perform ROS: mental acuity    Past Medical History  Diagnosis Date  . GERD (gastroesophageal reflux disease)   . Asthma   . Hx MRSA infection     on face  . Gastroparesis   . Diabetic neuropathy ([place])   . Seizures ([place])   . Family history of anesthesia complication     Pt mother can't have epidural procedures  . Dysrhythmia   . Pneumonia   . Arthritis     . Fibromyalgia   . 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 Type I diabetes mellitus ([place])     sees Dr. Loanne Drilling   . Syncope 02/16/2015  . Multiple allergies 11/02/2015  . Recurrent UTI 11/02/2015    Social History  Substance Use Topics  . Smoking status: Former Smoker -- 1.00 packs/day for 12 years    Types: Cigars, Cigarettes    Quit date: 09/07/2014  . Smokeless tobacco: Never Used  . Alcohol Use: No    Family History  Problem Relation Age of Onset  . Diabetes Father   . Hypertension Father   . Asthma      fhx  . Hypertension      fhx  . Stroke      fhx  . Heart disease Mother    Allergies  Allergen Reactions  . Cefuroxime Axetil Anaphylaxis  . Ertapenem Other (See Comments)    Rash and confusion-->tolerated Imipenem   . Morphine And Related Other (See Comments)    Changed  mental status, confusion, headache, visual hallucination  . Penicillins Anaphylaxis and Other (See Comments)    Tolerated Imipenem; no reaction to 7 day course of amoxicillin in 2015 Has patient had a PCN reaction causing immediate rash, facial/tongue/throat swelling, SOB or lightheadedness with hypotension: Yes Has patient had a PCN reaction causing severe rash involving mucus membranes or skin necrosis: No Has patient had a PCN reaction that required hospitalization Yes Has patient had a PCN reaction occurring within the last 10 years: No If all of the above answers are "NO", then may proceed with Cephalo  . Sulfa Antibiotics Anaphylaxis, Shortness Of Breath and Other (See Comments)  . Tessalon [Benzonatate] Anaphylaxis  . Shellfish Allergy Itching and Other (See Comments)    Took benadryl to alleviate reaction  . Levaquin [Levofloxacin In D5w] Swelling    Hand and forearm swelling  . Nsaids Itching and Other (See Comments)    Risk of bleeding, itching  . Miripirium Rash and Other (See Comments)    Change in mental status    OBJECTIVE: Filed Vitals:   12/21/15  0500 12/21/15 0600 12/21/15 0756 12/21/15 1154  BP: 139/90 131/87 117/83 101/71  Pulse: 88 85 79 86  Temp:   97.8 F (36.6 C) 97.9 F (36.6 C)  TempSrc:   Axillary Axillary  Resp: 18 18 14 11   Height:      Weight: 119 lb 11.4 oz (54.3 kg)     SpO2: 98% 97% 99% 97%   Body mass index is 18.21 kg/(m^2).  Physical Exam  Constitutional:  He is resting quietly in bed with his eyes open. He does not follow commands and is currently nonverbal. His mother states that he reached up this morning to hug her.  Cardiovascular: Normal rate and regular rhythm.   No murmur heard. Pulmonary/Chest: Effort normal and breath sounds normal.  Abdominal: Soft.  Skin: No rash noted.  Sacral pressure sores noted to be "beefy red and clean".    Lab Results Lab Results  Component Value Date   WBC 27.3* 12/21/2015   HGB 8.3* 12/21/2015   HCT 28.5* 12/21/2015   MCV 84.6 12/21/2015   PLT 713* 12/21/2015    Lab Results  Component Value Date   CREATININE 2.27* 12/21/2015   BUN 41* 12/21/2015   NA 146* 12/21/2015   K 4.9 12/21/2015   CL 108 12/21/2015   CO2 25 12/21/2015    Lab Results  Component Value Date   ALT 13* 12/18/2015   AST 21 12/18/2015   ALKPHOS 81 12/18/2015   BILITOT 0.6 12/18/2015     Microbiology: Recent Results (from the past 240 hour(s))  Culture, respiratory (NON-Expectorated)     Status: None   Collection Time: 12/12/15 11:17 AM  Result Value Ref Range Status   Specimen Description TRACHEAL ASPIRATE  Final   Special Requests Normal  Final   Gram Stain   Final    ABUNDANT WBC PRESENT, PREDOMINANTLY PMN FEW SQUAMOUS EPITHELIAL CELLS PRESENT MODERATE GRAM POSITIVE COCCI IN PAIRS FEW GRAM NEGATIVE RODS Performed at [facility]    Culture   Final    FEW PSEUDOMONAS AERUGINOSA Note: COLISTIN 1.5ug/mL SENSITIVE ETEST results for this drug are "FOR INVESTIGATIONAL USE ONLY" and should NOT be used for clinical purposes. MULTI DRUG RESISTANT ORGANISM Note:  CRITICAL RESULT CALLED TO, READ BACK BY AND VERIFIED WITH: TEPE RN 130PM 12/18/15 GUSTK Performed at [facility]    Report Status 12/18/2015 FINAL  Final   Organism ID, Bacteria PSEUDOMONAS  AERUGINOSA  Final      Susceptibility   Pseudomonas aeruginosa - MIC*    CEFEPIME 8 SENSITIVE Sensitive     CEFTAZIDIME 8 SENSITIVE Sensitive     CIPROFLOXACIN >=4 RESISTANT Resistant     GENTAMICIN >=16 RESISTANT Resistant     IMIPENEM >=16 RESISTANT Resistant     TOBRAMYCIN >=16 RESISTANT Resistant     PIP/TAZO RESISTANT      TICAR/CLAVULANIC RESISTANT      AMIKACIN <=16 SENSITIVE Sensitive     AZTREONAM >16 RESISTANT Resistant     * FEW PSEUDOMONAS AERUGINOSA  Culture, blood (routine x 2)     Status: None   Collection Time: 12/12/15  1:05 PM  Result Value Ref Range Status   Specimen Description BLOOD LEFT HAND  Final   Special Requests IN PEDIATRIC BOTTLE  1 ML  Final   Culture NO GROWTH 5 DAYS  Final   Report Status 12/17/2015 FINAL  Final  Culture, blood (routine x 2)     Status: None   Collection Time: 12/12/15  7:20 PM  Result Value Ref Range Status   Specimen Description BLOOD LEFT HAND  Final   Special Requests IN PEDIATRIC BOTTLE 2.5CC  Final   Culture NO GROWTH 5 DAYS  Final   Report Status 12/17/2015 FINAL  Final  Culture, Urine     Status: None   Collection Time: 12/13/15  6:27 AM  Result Value Ref Range Status   Specimen Description URINE, CATHETERIZED  Final   Special Requests Normal  Final   Culture >=100,000 COLONIES/mL YEAST  Final   Report Status 12/14/2015 FINAL  Final  Culture, blood (Routine X 2) w Reflex to ID Panel     Status: None (Preliminary result)   Collection Time: 12/18/15  5:05 PM  Result Value Ref Range Status   Specimen Description LEFT ANTECUBITAL  Final   Special Requests BOTTLES DRAWN AEROBIC ONLY 8CC  Final   Culture  Setup Time   Final    GRAM POSITIVE COCCI IN CLUSTERS AEROBIC BOTTLE ONLY CRITICAL RESULT CALLED TO, READ BACK BY AND  VERIFIED WITH: STEPHANIE VIVERITO,KRN AT [id number] 12/20/15 BY K BARR    Culture GRAM POSITIVE COCCI  Final   Report Status PENDING  Incomplete  C difficile quick scan w PCR reflex     Status: None   Collection Time: 12/19/15  2:00 PM  Result Value Ref Range Status   C Diff antigen NEGATIVE NEGATIVE Final   C Diff toxin NEGATIVE NEGATIVE Final   C Diff interpretation Negative for toxigenic C. difficile  Final     ASSESSMENT: His temperature is down over the past 24 hours but otherwise he is clinically unchanged. One of one blood culture from 12/18/2015 is growing gram-positive cocci in clusters. It remains unclear if this is true bacteremia or insignificant contaminant. I will continue vancomycin for now. His mental status and renal function have not improved since stopping colistin several days ago.  PLAN: 1. Continue vancomycin pending further observation and final culture results  Michel Bickers, MD [facility] [place] [phone] pager   [phone] cell 12/21/2015, 12:08 PM

## 2015-12-21 NOTE — Progress Notes (Signed)
Inpatient Diabetes Program Recommendations  AACE/ADA: New Consensus Statement on Inpatient Glycemic Control (2015)  Target Ranges:  Prepandial:   less than 140 mg/dL      Peak postprandial:   less than 180 mg/dL (1-2 hours)      Critically ill patients:  140 - 180 mg/dL  Results for SWAY, [name] (MRN [id number]) as of 12/21/2015 13:58  Ref. Range 12/20/2015 20:13 12/20/2015 23:47 12/21/2015 04:00 12/21/2015 08:30 12/21/2015 11:51  Glucose-Capillary Latest Ref Range: 65-99 mg/dL 194 (H) 160 (H) 280 (H) 296 (H) 297 (H)   Diabetes history: Type 1 diabetes Outpatient Diabetes medications: Lantus 15 units daily, Novolog 0-10 units four times daily Current orders for Inpatient glycemic control: Lantus 5 units daily, Novolog 4 units IV three times daily with meals, Novolog sensitive q 4 hours  Inpatient Diabetes Program Recommendations: Spoke with RN.  Note that patient is now on continuous tube feeds.  Please discontinue IV meal coverage Novolog 4 units..  Consider Novolog 2 units q 4 hours to cover CHO in tube feeds (if tube feeds held or stopped then tube feed coverage to be held).   Thanks, Adah Perl, RN, BC-ADM Inpatient Diabetes Coordinator Pager [phone] (8a-5p)

## 2015-12-21 NOTE — Progress Notes (Signed)
eLink Physician-Brief Progress Note Patient Name: Jason [name] DOB: 1985-12-21 MRN: [id number]   Date of Service  12/21/2015  HPI/Events of Note  Called by RN, patient on goal TF, mild drop off in UOP overnight, not drinking, mild rise in Cr  eICU Interventions  Might need IVFs, mild inc in Cr.   Primary team to address IVFs in addition to TF     Intervention Category Major Interventions: Other:  [name] 12/21/2015, 6:38 AM

## 2015-12-22 ENCOUNTER — Inpatient Hospital Stay (HOSPITAL_COMMUNITY): Payer: Medicaid Other

## 2015-12-22 LAB — BASIC METABOLIC PANEL
Anion gap: 11 (ref 5–15)
BUN: 41 mg/dL — ABNORMAL HIGH (ref 6–20)
CHLORIDE: 109 mmol/L (ref 101–111)
CO2: 25 mmol/L (ref 22–32)
CREATININE: 2.26 mg/dL — AB (ref 0.61–1.24)
Calcium: 11.7 mg/dL — ABNORMAL HIGH (ref 8.9–10.3)
GFR calc Af Amer: 43 mL/min — ABNORMAL LOW (ref >60)
GFR calc non Af Amer: 37 mL/min — ABNORMAL LOW (ref >60)
Glucose, Bld: 347 mg/dL — ABNORMAL HIGH (ref 65–99)
POTASSIUM: 4.8 mmol/L (ref 3.5–5.1)
SODIUM: 145 mmol/L (ref 135–145)

## 2015-12-22 LAB — CBC
HCT: 27.6 % — ABNORMAL LOW (ref 39.0–52.0)
HEMOGLOBIN: 7.7 g/dL — AB (ref 13.0–17.0)
MCH: 24.1 pg — AB (ref 26.0–34.0)
MCHC: 27.9 g/dL — ABNORMAL LOW (ref 30.0–36.0)
MCV: 86.3 fL (ref 78.0–100.0)
PLATELETS: 722 10*3/uL — AB (ref 150–400)
RBC: 3.2 MIL/uL — AB (ref 4.22–5.81)
RDW: 16.7 % — ABNORMAL HIGH (ref 11.5–15.5)
WBC: 33.9 10*3/uL — AB (ref 4.0–10.5)

## 2015-12-22 LAB — BLOOD GAS, ARTERIAL
Acid-base deficit: 0.6 mmol/L (ref 0.0–2.0)
Bicarbonate: 24 mEq/L (ref 20.0–24.0)
DRAWN BY: 313941
FIO2: 0.31
LHR: 14 {breaths}/min
MECHVT: 550 mL
O2 Saturation: 96.6 %
PATIENT TEMPERATURE: 99
PCO2 ART: 43.1 mmHg (ref 35.0–45.0)
PEEP: 5 cmH2O
PO2 ART: 86.9 mmHg (ref 80.0–100.0)
TCO2: 25.3 mmol/L (ref 0–100)
pH, Arterial: 7.365 (ref 7.350–7.450)

## 2015-12-22 LAB — GLUCOSE, CAPILLARY
GLUCOSE-CAPILLARY: 251 mg/dL — AB (ref 65–99)
GLUCOSE-CAPILLARY: 219 mg/dL — AB (ref 65–99)
Glucose-Capillary: 173 mg/dL — ABNORMAL HIGH (ref 65–99)
Glucose-Capillary: 294 mg/dL — ABNORMAL HIGH (ref 65–99)
Glucose-Capillary: 315 mg/dL — ABNORMAL HIGH (ref 65–99)

## 2015-12-22 MED ORDER — AMIKACIN SULFATE 500 MG/2ML IJ SOLN
500.0000 mg | Freq: Two times a day (BID) | INTRAMUSCULAR | Status: DC
  Administered 2015-12-22 – 2015-12-25 (×8): 500 mg via RESPIRATORY_TRACT
  Filled 2015-12-22 (×11): qty 2

## 2015-12-22 MED ORDER — CHLORHEXIDINE GLUCONATE 0.12% ORAL RINSE (MEDLINE KIT)
15.0000 mL | Freq: Two times a day (BID) | OROMUCOSAL | Status: DC
Start: 2015-12-22 — End: 2015-12-22

## 2015-12-22 MED ORDER — ANTISEPTIC ORAL RINSE SOLUTION (CORINZ): 7.0000 mL | Freq: Four times a day (QID) | OROMUCOSAL | Status: DC

## 2015-12-22 NOTE — Progress Notes (Signed)
Nutrition Follow-up  DOCUMENTATION CODES:   Not applicable  INTERVENTION:  -Continue Glucerna 1.2 @ 65 ml/hr Provides 1872 kcal, 94 grams of protein, 1256 ml of free water.   NUTRITION DIAGNOSIS:   Inadequate oral intake related to inability to eat as evidenced by NPO status.  -Ongoing   GOAL:   Patient will meet greater than or equal to 90% of their needs  -Met  MONITOR:   Vent status, Labs, TF tolerance, I & O's, Skin, Weight trends  ASSESSMENT:   Jason [name] is a 29 y.o. 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 He is followed in trach clinic and he has been doing well and has not required any ventilator support for quite some time, last time roughly November 2016. He had been able to use PMV 24 hours a day without problem.  Pt is currently receiving Glucerna 1.2 @ 65 ml/hr, provides 1872 kcal, 94 grams of protein, 1256 ml of free water.   Spoke with pt's mother, she reports pt tolerating TF well.   Labs reviewed; CBG 173-315. Medications reviewed, free water flush 200 ml QID.  Per CCM note pt declining from infection stand point.   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  Estimated Nutritional Needs:   Kcal:  1650-1950 calories  Protein:  90-110 grams  Fluid:  >/= 1.7L  EDUCATION NEEDS:   No education needs identified at this time   , Dietetic Intern Pager: [phone]  

## 2015-12-22 NOTE — Progress Notes (Addendum)
Pharmacy Antibiotic Note  Jason [name] is a 30 y.o. male admitted on 12/06/2015 with MDR pseudomonas PNA.  Pharmacy has been consulted for inhaled amikacin dosing.   Finished abx course for HAP recently but unfortunately still showing signs of infection. Tmax of 102.2 yesterday and WBC continues to climb to 33.9.  Plan: Start inhaled Amikacin 500mg  BID Discussed with RRT on 2C (RT to mix amikacin 500mg /75mL vial with 1mL of normal saline for use as nebulization - Added to admin instructions) Continue vancomycin 500mg  IV Q24 Monitor clinical picture, renal function, VT at Css F/U LOT    Height: 5\' 8"  (172.7 cm) Weight: 119 lb 14.9 oz (54.4 kg) IBW/kg (Calculated) : 68.4  Temp (24hrs), Avg:99.8 F (37.7 C), Min:97.9 F (36.6 C), Max:102.2 F (39 C)   Recent Labs Lab 12/17/15 0359 12/18/15 2310 12/20/15 0418 12/20/15 1531 12/21/15 0527 12/22/15 0350  WBC 15.7* 17.9* 22.9*  --  27.3* 33.9*  CREATININE 0.78 1.57* 1.97* 2.13* 2.27* 2.26*  LATICACIDVEN  --  1.0  --   --   --   --     Estimated Creatinine Clearance: 36.4 mL/min (by C-G formula based on Cr of 2.26).    Allergies  Allergen Reactions  . Cefuroxime Axetil Anaphylaxis  . Ertapenem Other (See Comments)    Rash and confusion-->tolerated Imipenem   . Morphine And Related Other (See Comments)    Changed mental status, confusion, headache, visual hallucination  . Penicillins Anaphylaxis and Other (See Comments)    Tolerated Imipenem; no reaction to 7 day course of amoxicillin in 2015 Has patient had a PCN reaction causing immediate rash, facial/tongue/throat swelling, SOB or lightheadedness with hypotension: Yes Has patient had a PCN reaction causing severe rash involving mucus membranes or skin necrosis: No Has patient had a PCN reaction that required hospitalization Yes Has patient had a PCN reaction occurring within the last 10 years: No If all of the above answers are "NO", then may proceed with Cephalo  .  Sulfa Antibiotics Anaphylaxis, Shortness Of Breath and Other (See Comments)  . Tessalon [Benzonatate] Anaphylaxis  . Shellfish Allergy Itching and Other (See Comments)    Took benadryl to alleviate reaction  . Levaquin [Levofloxacin In D5w] Swelling    Hand and forearm swelling  . Nsaids Itching and Other (See Comments)    Risk of bleeding, itching  . Miripirium Rash and Other (See Comments)    Change in mental status    Antimicrobials this admission: Vanc 3/1 >> 3/7, 3/14 >> Levaquin 3/1 >> 3/7 Primaxin x 1 2/28 Aztreonam 3/7 >> 3/9 Tigecycline 3/7 >> 3/9 Colistin 3/9 >> 3/13 Fluc 3/9 >>  Dose adjustments this admission: 3/2 VT = 19  Microbiology results: 3/12 BCx: 1/2 CONS 3/6 Sputum: MDR Pseudomonas (sensitive to Amikacin)  Thank you for allowing pharmacy to be a part of this patient's care.  Elenor Quinones, PharmD, BCPS Clinical Pharmacist Pager [phone] 12/22/2015 9:53 AM

## 2015-12-22 NOTE — Progress Notes (Signed)
PULMONARY / CRITICAL CARE MEDICINE   Name: Jason [name] MRN: [id number] DOB: December 06, 1985    ADMISSION DATE:  12/06/2015 CONSULTATION DATE:  12/06/15  REFERRING MD:  EDP  CHIEF COMPLAINT:  Decrease in O2 sats, fever, PNA  SUBJECTIVE: Unresponsive. Now w/ agonal resp status  VITAL SIGNS: BP 128/85 mmHg  Pulse 99  Temp(Src) 99 F (37.2 C) (Axillary)  Resp 10  Ht 5\' 8"  (1.727 m)  Wt 119 lb 14.9 oz (54.4 kg)  BMI 18.24 kg/m2  SpO2 98%   VENTILATOR SETTINGS: Vent Mode:  [-]  FiO2 (%):  [28 %] 28 %  INTAKE / OUTPUT: I/O last 3 completed shifts: In: 3960 [I.V.:1515; NG/GT:2295; IV Piggyback:150] Out: 3060 [Urine:1635; [id number]   PHYSICAL EXAMINATION: General: Chronically ill --> now essentially unresponsive and agonal  Neuro: Opens eyes to stimuli but falls back to sleep quickly. HEENT: PERRL, Clean trach site. Cardiovascular: RRR, no MRG Lungs: diffuse rhonchi. + tactile frem, Right worse than left Abdomen: PEG in place, + BS, ostomy Musculoskeletal: Muscle wasting bilaterally. Skin: No rashes. Sacral wound vac in place.   LABS:  BMET  Recent Labs Lab 12/20/15 1531 12/21/15 0527 12/22/15 0350  NA 144 146* 145  K 5.5* 4.9 4.8  CL 106 108 109  CO2 25 25 25   BUN 37* 41* 41*  CREATININE 2.13* 2.27* 2.26*  GLUCOSE 406* 382* 347*    Electrolytes  Recent Labs Lab 12/20/15 1531 12/21/15 0527 12/22/15 0350  CALCIUM 11.2* 11.7* 11.7*  MG  --  1.5*  --   PHOS  --  3.6  --     CBC  Recent Labs Lab 12/20/15 0418 12/21/15 0527 12/22/15 0350  WBC 22.9* 27.3* 33.9*  HGB 8.1* 8.3* 7.7*  HCT 28.8* 28.5* 27.6*  PLT 643* 713* 722*    Coag's No results for input(s): APTT, INR in the last 168 hours.  Sepsis Markers  Recent Labs Lab 12/18/15 2310  LATICACIDVEN 1.0    ABG  Recent Labs Lab 12/18/15 2355 12/19/15 0955  PHART 7.324* 7.336*  PCO2ART 49.0* 48.8*  PO2ART 72.9* 93.2    Liver Enzymes  Recent Labs Lab 12/18/15 2310   AST 21  ALT 13*  ALKPHOS 81  BILITOT 0.6  ALBUMIN 2.0*    Cardiac Enzymes No results for input(s): TROPONINI, PROBNP in the last 168 hours.  Glucose  Recent Labs Lab 12/21/15 1151 12/21/15 1602 12/21/15 2018 12/22/15 0016 12/22/15 0346 12/22/15 0720  GLUCAP 297* 223* 182* 173* 315* 251*    Imaging No results found. PCX w/ marked worsening of right chest w/ sig patchy right sided airspace disease.   STUDIES:  CXR 02/28 > bibasilar airspace densities concerning for PNA. CT abdo/ pelvis>>>no fistula CXR 3/7 >>mildly increased bibasilar opacities are noted most consistent with subsegmental atelectasis and associated pleural effusions.  EEG 03/13 > mild to mod diffuse slowing with triphasic waves (could be seen with hypoxic/ischemic injury, toxic/metabolic encephalopathies (hepatic?), med effect). CXR 3/14 >> Consolidation medial right base with small right effusion, left lung clear  CULTURES: Blood 02/28 >>No growth  Urine 02/28 >>Multiple species, suggest recollection Blood 3/1>> No growth  Sputum 3/6 >>> few pseudomonas >>>MDR Blood Culture 3/6 >> neg Urine Culture 3/7 >>Yeast  MRSA pos screen Blood 03/12 > GPC's > CDiff 3/13 >> Negative  Blood 3/14 >>   ANTIBIOTICS: Vanc 02/28 (7/7) >> 3/7 Levaquin 02/28 (7/7) >>3/7 Tigecycline 3/7 >> 3/9 Aztreonam 3/7 >> 3/9 Colistin 3/9 >> 3/13 Fluconazole 03/9 > Vanc 03/14 >  Inhaled amikacin 3/16>>>  SIGNIFICANT EVENTS: 2/28  Admitted with probable HCAP 3/2- no fevers, low secretions, remains vented, refusing Chest PT and other treatments 3/3 improved to TC, pcxr better 3/5- trach collar 24  Hrs 3/6- Tmax 103.2 3/8 still spiking temps  3/9 Colistin started.  3/13: encephalopathic, rising creatinine, hyperkalemic.  3/14 > remains encephalopathic.  1/2 blood cx's with GPC's.  3/16 : unresponsive. Resp pattern worse, agonal. Adding inhaled amikacin, placed back on vent. Began talks w/ mother and father about EOL.  They understand he may not survive. Mom not ready to make DNR, but says "she is prepared and won't let trent suffer".   LINES/TUBES: Chronic trach / PEG >> Suprapubic catheter changed 3/8  ASSESSMENT / PLAN:  PULMONARY A: Chronic respiratory failure with trach dependence - Now acute on Chronic respiratory failure on 3/16 w/ agonal resp pattern.  Tracheostomy status MDR pseudeomonas PNA -->concerned that his clinical decline reflects the result of [name] stopping treatment for the pseudomonas. We had hoped that this was a colonizer, but his rising WBC, resp distress & worsening CXR and overall clinical decline would reflect otherwise.  P:  Place back on full vent support F/u abg See ID section. CXR intermittently.  CARDIOVASCULAR A:  Chronic hypotension - on midodrine P:  Continue midodrine (dose adjusted up). Monitor hemodynamics.  RENAL A:   New AKI and hyperkalemia --> was hypotensive but also on Colistin: this has improved as of 3/16 NONAG acidosis resolving 3/4 P:   Cont NS @ 88ml/hr cont free water to 226ml every 6 hours Renal dose meds.  BMP in AM.  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 TF's at goal.  HEMATOLOGIC A:   Anemia - chronic, some dilution now Thrombocytosis - chronic VTE Prophylaxis P:  Transfuse for Hgb < 7. SCD's / heparin.  INFECTIOUS A:   Bacteremia - 1/2 blood cultures from 03/12 with GPC's.  Also with increasing leukocytosis as of 03/14 UTI - hx pseudomonal UTI in past with resistance to multiple abx - positive for yeast  HCAP-->pseudomonas MDR given clinical decline feel that this is more than a colonizer.  Hx sacral wounds P:   ID following - appreciate the assistance. Add inhaled amikacin  Vanc added back 03/14. Continue diflucan. WOC following.  ENDOCRINE A:   DM brittle - per DM coordinator note, pt and family refused lantus 03/13.  Have recommended changes  to novolog to include tube fee coverage > added 03/14. Repeated episodes hypoglycemia-->better w/ reduced lantus Adrenal insuff - cortisol 12.7 but no need for additional steroids for now. P:   Continue SSI - tube coverage added 03/14. Lantus reduced to 5 units  started 3/13 because of repeated lows. Continue sensitive scale.  NEUROLOGIC A:   Acute encephalopathy - EEG 03/13 with mild to mod diffuse slowing with triphasic waves (could be seen with hypoxic/ischemic injury, toxic/metabolic encephalopathies (hepatic?), med effect). Ammonia, TSH negative Hx seizures (not on meds), paraplegia, fibromyalgia, diabetic neuropathy P:   D/cLyrica and Baclofen ns Would avoid benadryl (received 03/13 and was very somnolent after).  Family updated: Mother at bedside and updated daily  He looks worse. We are rapidly reaching the extent of care that we have available to [name]. At this point if he continues to decline from an infection stand-point our only other option would be to see if he responds to ceftaz or cefepime. He has a reported reaction w/ SOB with this  but we would even consider pre-medicating w/ H1 blocker and steroids. I have spoke to the mother and father. Made it clear we may indeed be dealing with the end of [name]'s life. I have recommended that should his heart stop we should not do CPR. His mother acknowledges this but is not ready to formally declare this. She says "I just need to hope this works & if it doesn't I'm prepared to do the right thing and won't let my son suffer". She and her husband are at the bedside 24/7. They understand how little hope there is. We are truly entering the end of what we have to offer at this point.   90 minutes CCM time which included: adding mechanical ventilation, interpreting CXR and lab data, as well as speaking/consulting w/ infectious disease and ID pharmacy to assist w/ abx. In addition also extensive bedside discussion w/ family re: plan of care which  included initiation of goals of care.   Erick Colace ACNP-BC [place] Pager # [phone] OR # [phone] if no answer  Reviewed, examined.  He is less responsive today.  Increased WOB.  Placed on vent.  Coarse crackles on Rt.  CXR with increased infiltrate on Rt.  Assessment/plan: Acute on chronic respiratory failure. Recurrent PNA. Bacteremia. - full vent support - f/u CXR -ABx per ID >> limited options  Aki. - f/u BMET  D/w pt's mother.  Explained that his options for treating infections are becoming more limited, and that we might not be able to treat him through this infection.  She understands this, but is still holding out hope he will get better.  CC time by me independent of APP time is 32 minutes.  Chesley Mires, MD [facility] Pulmonary/Critical Care 12/22/2015, 2:00 PM Pager:  [phone] After 3pm call: [phone]

## 2015-12-22 NOTE — Progress Notes (Signed)
MD notified of temp of 102.8 po.  NNO.  Mother notified as well.  Tylenol given via GTube.  Ice applied to axilla and groin areas.

## 2015-12-22 NOTE — Progress Notes (Signed)
Jason [name] for Infectious Disease   Reason for visit: Follow up on MDR penumonia  Interval History: his renal function is worsening.  Fever, minimal responsiveness.  Started inhaled amikacin.  On vancomycin as well. Previous culture with MDR Pseudomonas.   Discussed care with Pete B.  CXR independently reviewed, worsening on the right, clearing on the left. Discussed care with mother at the bedside.   Physical Exam: Constitutional:  Filed Vitals:   12/22/15 1020 12/22/15 1124  BP: 132/90 139/92  Pulse: 92 103  Temp:    Resp: 14 24  minimal responsiveness Eyes: anicteric HENT: + trach Respiratory: + rhonchi Cardiovascular: Tachy RR GI: soft, nt, nd  Review of Systems: Unable to be assessed due to mental status  Lab Results  Component Value Date   WBC 33.9* 12/22/2015   HGB 7.7* 12/22/2015   HCT 27.6* 12/22/2015   MCV 86.3 12/22/2015   PLT 722* 12/22/2015    Lab Results  Component Value Date   CREATININE 2.26* 12/22/2015   BUN 41* 12/22/2015   NA 145 12/22/2015   K 4.8 12/22/2015   CL 109 12/22/2015   CO2 25 12/22/2015    Lab Results  Component Value Date   ALT 13* 12/18/2015   AST 21 12/18/2015   ALKPHOS 81 12/18/2015     Microbiology: Recent Results (from the past 240 hour(s))  Culture, blood (routine x 2)     Status: None   Collection Time: 12/12/15  1:05 PM  Result Value Ref Range Status   Specimen Description BLOOD LEFT HAND  Final   Special Requests IN PEDIATRIC BOTTLE  1 ML  Final   Culture NO GROWTH 5 DAYS  Final   Report Status 12/17/2015 FINAL  Final  Culture, blood (routine x 2)     Status: None   Collection Time: 12/12/15  7:20 PM  Result Value Ref Range Status   Specimen Description BLOOD LEFT HAND  Final   Special Requests IN PEDIATRIC BOTTLE 2.5CC  Final   Culture NO GROWTH 5 DAYS  Final   Report Status 12/17/2015 FINAL  Final  Culture, Urine     Status: None   Collection Time: 12/13/15  6:27 AM  Result Value Ref Range Status    Specimen Description URINE, CATHETERIZED  Final   Special Requests Normal  Final   Culture >=100,000 COLONIES/mL YEAST  Final   Report Status 12/14/2015 FINAL  Final  Culture, blood (Routine X 2) w Reflex to ID Panel     Status: None (Preliminary result)   Collection Time: 12/18/15  5:05 PM  Result Value Ref Range Status   Specimen Description LEFT ANTECUBITAL  Final   Special Requests BOTTLES DRAWN AEROBIC ONLY 8CC  Final   Culture  Setup Time   Final    GRAM POSITIVE COCCI IN CLUSTERS AEROBIC BOTTLE ONLY CRITICAL RESULT CALLED TO, READ BACK BY AND VERIFIED WITH: STEPHANIE VIVERITO,KRN AT 0827 12/20/15 BY K BARR    Culture STAPHYLOCOCCUS SPECIES (COAGULASE NEGATIVE)  Final   Report Status PENDING  Incomplete  C difficile quick scan w PCR reflex     Status: None   Collection Time: 12/19/15  2:00 PM  Result Value Ref Range Status   C Diff antigen NEGATIVE NEGATIVE Final   C Diff toxin NEGATIVE NEGATIVE Final   C Diff interpretation Negative for toxigenic C. difficile  Final  Culture, blood (routine x 2)     Status: None (Preliminary result)   Collection Time: 12/20/15  3:20 PM  Result Value Ref Range Status   Specimen Description BLOOD LEFT ANTECUBITAL  Final   Special Requests BOTTLES DRAWN AEROBIC AND ANAEROBIC 5CC  Final   Culture NO GROWTH 2 DAYS  Final   Report Status PENDING  Incomplete  Culture, blood (routine x 2)     Status: None (Preliminary result)   Collection Time: 12/20/15  3:29 PM  Result Value Ref Range Status   Specimen Description BLOOD LEFT HAND  Final   Special Requests IN PEDIATRIC BOTTLE 3CC  Final   Culture NO GROWTH 2 DAYS  Final   Report Status PENDING  Incomplete    Impression/Plan:  1. MDR pneumonia - trying amikacin.  Only other option is ceftolozane/tazobactam, though he has a cephalosporin listed as an allergy (anaphyaxis) though mom understands this would be his only other option.  He would be premedicated.  I would give him 24-48 hours to see if  he stabilizes with amikacin inhaled.  2. Prognosis - discussed with mom prognosis and she does realize there is a high chance he will not survive this hospitalization.     3. CoNS blood culture - c/w contaminate with just 1 positive of multiple.

## 2015-12-22 NOTE — Progress Notes (Signed)
Noted that patient on continuous tube feedings. Recommend changing Novolog 4 units TID to every 4 hours since on continuous tube feedings.  Will continue to monitor while in the hospital. Harvel Ricks RN BSN CDE

## 2015-12-22 NOTE — Progress Notes (Signed)
Patient is has progressively started working harder to breath over night . He is using accessory muscles and breathing with his stomach as well. Dr Halford Chessman was notified.

## 2015-12-23 ENCOUNTER — Inpatient Hospital Stay (HOSPITAL_COMMUNITY): Payer: Medicaid Other

## 2015-12-23 DIAGNOSIS — B957 Other staphylococcus as the cause of diseases classified elsewhere: Secondary | ICD-10-CM

## 2015-12-23 LAB — CBC
HEMATOCRIT: 26.4 % — AB (ref 39.0–52.0)
Hemoglobin: 7.4 g/dL — ABNORMAL LOW (ref 13.0–17.0)
MCH: 23.6 pg — AB (ref 26.0–34.0)
MCHC: 28 g/dL — AB (ref 30.0–36.0)
MCV: 84.1 fL (ref 78.0–100.0)
PLATELETS: 749 10*3/uL — AB (ref 150–400)
RBC: 3.14 MIL/uL — ABNORMAL LOW (ref 4.22–5.81)
RDW: 16.7 % — AB (ref 11.5–15.5)
WBC: 29.6 10*3/uL — ABNORMAL HIGH (ref 4.0–10.5)

## 2015-12-23 LAB — GLUCOSE, CAPILLARY
GLUCOSE-CAPILLARY: 177 mg/dL — AB (ref 65–99)
GLUCOSE-CAPILLARY: 331 mg/dL — AB (ref 65–99)
GLUCOSE-CAPILLARY: 210 mg/dL — AB (ref 65–99)
Glucose-Capillary: 194 mg/dL — ABNORMAL HIGH (ref 65–99)
Glucose-Capillary: 291 mg/dL — ABNORMAL HIGH (ref 65–99)
Glucose-Capillary: 343 mg/dL — ABNORMAL HIGH (ref 65–99)
Glucose-Capillary: 359 mg/dL — ABNORMAL HIGH (ref 65–99)
Glucose-Capillary: 362 mg/dL — ABNORMAL HIGH (ref 65–99)
Glucose-Capillary: 395 mg/dL — ABNORMAL HIGH (ref 65–99)

## 2015-12-23 LAB — COMPREHENSIVE METABOLIC PANEL
ALBUMIN: 2.1 g/dL — AB (ref 3.5–5.0)
ALT: 11 U/L — AB (ref 17–63)
AST: 15 U/L (ref 15–41)
Alkaline Phosphatase: 104 U/L (ref 38–126)
Anion gap: 11 (ref 5–15)
BUN: 45 mg/dL — AB (ref 6–20)
CHLORIDE: 108 mmol/L (ref 101–111)
CO2: 24 mmol/L (ref 22–32)
CREATININE: 2.26 mg/dL — AB (ref 0.61–1.24)
Calcium: 12.1 mg/dL — ABNORMAL HIGH (ref 8.9–10.3)
GFR calc Af Amer: 43 mL/min — ABNORMAL LOW (ref >60)
GFR calc non Af Amer: 37 mL/min — ABNORMAL LOW (ref >60)
GLUCOSE: 433 mg/dL — AB (ref 65–99)
POTASSIUM: 4.9 mmol/L (ref 3.5–5.1)
Sodium: 143 mmol/L (ref 135–145)
Total Bilirubin: 0.3 mg/dL (ref 0.3–1.2)
Total Protein: 8.1 g/dL (ref 6.5–8.1)

## 2015-12-23 LAB — CULTURE, BLOOD (ROUTINE X 2)

## 2015-12-23 LAB — VANCOMYCIN, RANDOM: VANCOMYCIN RM: 35 ug/mL

## 2015-12-23 MED ORDER — INSULIN ASPART 100 UNIT/ML ~~LOC~~ SOLN
3.0000 [IU] | SUBCUTANEOUS | Status: DC
  Administered 2015-12-23 – 2015-12-26 (×16): 3 [IU] via SUBCUTANEOUS

## 2015-12-23 MED ORDER — FENTANYL CITRATE (PF) 100 MCG/2ML IJ SOLN
25.0000 ug | INTRAMUSCULAR | Status: AC | PRN
  Administered 2015-12-23 – 2015-12-25 (×2): 25 ug via INTRAVENOUS
  Filled 2015-12-23 (×3): qty 2

## 2015-12-23 MED ORDER — INSULIN GLARGINE 100 UNIT/ML ~~LOC~~ SOLN
8.0000 [IU] | Freq: Every day | SUBCUTANEOUS | Status: DC
  Administered 2015-12-23 – 2015-12-28 (×6): 8 [IU] via SUBCUTANEOUS
  Filled 2015-12-23 (×6): qty 0.08

## 2015-12-23 MED ORDER — OXYCODONE HCL 5 MG PO TABS
20.0000 mg | ORAL_TABLET | Freq: Once | ORAL | Status: AC
  Administered 2015-12-23: 20 mg via ORAL
  Filled 2015-12-23: qty 4

## 2015-12-23 MED ORDER — INSULIN ASPART 100 UNIT/ML ~~LOC~~ SOLN
9.0000 [IU] | Freq: Once | SUBCUTANEOUS | Status: AC
Start: 2015-12-23 — End: 2015-12-23
  Administered 2015-12-23: 9 [IU] via SUBCUTANEOUS

## 2015-12-23 NOTE — Consult Note (Signed)
WOC wound follow up:  Vac has maintained a seal since Wed. Re-applied 3 Vac dressings leaving the rectum area free from dressings, in hopes that suction remain in place if another stool occurs. Flexiseal in place with small amt yellow liquid stool. 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 Rectum area is free from dressings and pt tolerated without pain. Attached Y connector to one machine with cont suction on at 147mm. Plan for dressing change on Mon. If stooling causes Vac to loose seal, then orders provided for the staff nurse to apply moist gauze dressings until Winston can re-apply the Vac dressings. He can resume use of his own negative pressure device after discharge. One hour spent performing this complex Vac dressing. Mother at bedside to discuss plan of care.  Julien Girt MSN, RN, [place], [place], [place]

## 2015-12-23 NOTE — Progress Notes (Signed)
[place] Progress Note Patient Name: Jason [name] DOB: 1986-09-04 MRN: [id number]   Date of Service  12/23/2015  HPI/Events of Note  pain  eICU Interventions  Low dose prn fentanyl     Intervention Category Intermediate Interventions: Pain - evaluation and management  Simonne Maffucci 12/23/2015, 8:46 PM

## 2015-12-23 NOTE — Progress Notes (Signed)
Inpatient Diabetes Program Recommendations  AACE/ADA: New Consensus Statement on Inpatient Glycemic Control (2015)  Target Ranges:  Prepandial:   less than 140 mg/dL      Peak postprandial:   less than 180 mg/dL (1-2 hours)      Critically ill patients:  140 - 180 mg/dL  Results for IHSAAN, DANEHY (MRN [id number]) as of 12/23/2015 10:41  Ref. Range 12/22/2015 00:16 12/22/2015 03:46 12/22/2015 07:20 12/22/2015 13:01 12/22/2015 16:45 12/22/2015 20:00 12/22/2015 22:23 12/23/2015 00:40 12/23/2015 04:56 12/23/2015 06:00 12/23/2015 07:23  Glucose-Capillary Latest Ref Range: 65-99 mg/dL 173 (H) 315 (H) 251 (H) 294 (H) 194 (H) 177 (H) 219 (H) 362 (H) 359 (H) 395 (H) 331 (H)   Review of Glycemic Control   Current orders for Inpatient glycemic control: Lantus 8 units daily (increased today from 5 units), Novolog 4 units TID with meals, Novolog 0-9 units Q4H  Inpatient Diabetes Program Recommendations: Insulin - Meal Coverage: Patient has Novolog 4 units TID with meals ordered. Patient is on Glucerna @ 65 ml/hr and needs to have insulin coverage Q4H for tube feeding. Please consider discontinuing Novolog 4 untis TID and order Novolog 3 units Q4H for tube feeding coverage.  Thanks, Barnie Alderman, RN, MSN, CDE Diabetes Coordinator Inpatient Diabetes Program [phone] (Team Pager from [place] to [place]) [phone] ([place] office) [phone] [facility] office) [phone] [facility] office)

## 2015-12-23 NOTE — Progress Notes (Signed)
Pharmacy Antibiotic Note  Jason [name] is a 30 y.o. male admitted on 12/06/2015 with MDR pseudomonas PNA.  Pharmacy has been consulted for inhaled amikacin and vancomycin dosing.   He recently finished a course of abx for HAP but continues to show signs of infection. Tmax 102.8, currently afebrile. WBC somewhat decreased to 29.6. AKI from colistin has not yet resolved with SCr stable at 2.26 x 3 days. His VR today returned at 35, with his last dose of vancomycin given yesterday (~19 hours prior). He is currently on day 9 of fluconazole for yeast in his urine.  Amikacin monotherapy for MDR pseudomonas pneumonia is not ideal, but given pt's multiple serious allergies and AKI with colistin, options to treat him are limited. ID is discussing with family the option of trying Zerbaxa with pre-medication if he does not stabilize with amikacin.    Plan: -Continue inhaled Amikacin 500 mg BID -HOLD vancomycin given random level = 35 -repeat VR ordered for 0500 on 3/19 -Monitor clinical picture, renal function, VT at Css -F/U LOT   Height: 5\' 8"  (172.7 cm) Weight: 123 lb 4.8 oz (55.929 kg) IBW/kg (Calculated) : 68.4  Temp (24hrs), Avg:99.6 F (37.6 C), Min:98 F (36.7 C), Max:102.8 F (39.3 C)   Recent Labs Lab 12/18/15 2310 12/20/15 0418 12/20/15 1531 12/21/15 0527 12/22/15 0350 12/23/15 0530  WBC 17.9* 22.9*  --  27.3* 33.9* 29.6*  CREATININE 1.57* 1.97* 2.13* 2.27* 2.26* 2.26*  LATICACIDVEN 1.0  --   --   --   --   --   VANCORANDOM  --   --   --   --   --  35    Estimated Creatinine Clearance: 37.4 mL/min (by C-G formula based on Cr of 2.26).    Allergies  Allergen Reactions  . Cefuroxime Axetil Anaphylaxis  . Ertapenem Other (See Comments)    Rash and confusion-->tolerated Imipenem   . Morphine And Related Other (See Comments)    Changed mental status, confusion, headache, visual hallucination  . Penicillins Anaphylaxis and Other (See Comments)    Tolerated Imipenem; no  reaction to 7 day course of amoxicillin in 2015 Has patient had a PCN reaction causing immediate rash, facial/tongue/throat swelling, SOB or lightheadedness with hypotension: Yes Has patient had a PCN reaction causing severe rash involving mucus membranes or skin necrosis: No Has patient had a PCN reaction that required hospitalization Yes Has patient had a PCN reaction occurring within the last 10 years: No If all of the above answers are "NO", then may proceed with Cephalo  . Sulfa Antibiotics Anaphylaxis, Shortness Of Breath and Other (See Comments)  . Tessalon [Benzonatate] Anaphylaxis  . Shellfish Allergy Itching and Other (See Comments)    Took benadryl to alleviate reaction  . Levaquin [Levofloxacin In D5w] Swelling    Hand and forearm swelling  . Nsaids Itching and Other (See Comments)    Risk of bleeding, itching  . Miripirium Rash and Other (See Comments)    Change in mental status    Antimicrobials this admission: Vanc 3/1 >> 3/7, 3/14 >> Levaquin 3/1 >> 3/7 Primaxin x 1 2/28 Aztreonam 3/7 >> 3/9 Tigecycline 3/7 >> 3/9 Colistin 3/9 >> 3/13 Fluc 3/9 >>  Dose adjustments this admission: 3/2 VT = 19 3/17 0530 VR = 35 (last dose 500 mg on 3/16 at 1022)  Microbiology results: 2/28 UCx >> mult species FINAL 2/28 BCx x2>> ngF 3/1 BCx x2>> ngF 3/1 MRSA PCR >> positive 3/6 TA - MDR Pseudomonas (  sensitive to Amikacin) 3/6  BCx x 2>> ngF 3/7  Urine:  > 100K yeast 3/13 cdiff: neg 3/12 BCx 1/2 > CoNS - pending 3/14 BCx x2 > NG x 2 days   Thank you for allowing pharmacy to be a part of this patient's care.   Governor Specking, PharmD Clinical Pharmacy Resident Pager: [phone]  12/23/2015 10:06 AM

## 2015-12-23 NOTE — Progress Notes (Signed)
PULMONARY / CRITICAL CARE MEDICINE   Name: Jason [name] MRN: [id number] DOB: 27-Dec-1985    ADMISSION DATE:  12/06/2015 CONSULTATION DATE:  12/06/15  REFERRING MD:  EDP  CHIEF COMPLAINT:  Decrease in O2 sats, fever, PNA  SUBJECTIVE: Unresponsive.   VITAL SIGNS: BP 120/86 mmHg  Pulse 76  Temp(Src) 98.5 F (36.9 C) (Oral)  Resp 14  Ht 5\' 8"  (1.727 m)  Wt 123 lb 4.8 oz (55.929 kg)  BMI 18.75 kg/m2  SpO2 100%   VENTILATOR SETTINGS: Vent Mode:  [-] PRVC FiO2 (%):  [30 %] 30 % Set Rate:  [14 bmp] 14 bmp Vt Set:  [550 mL] 550 mL PEEP:  [5 cmH20] 5 cmH20 Plateau Pressure:  [10 cmH20-27 cmH20] 20 cmH20  INTAKE / OUTPUT: I/O last 3 completed shifts: In: 4830 [I.V.:2700; NG/GT:2030; IV Piggyback:100] Out: 2275 [Urine:1475; Stool:800]   PHYSICAL EXAMINATION: General: Chronically ill --> now essentially unresponsive and agonal on vent Neuro: Opens eyes to stimuli but falls back to sleep quickly. HEENT: PERRL, Clean trach site. Cardiovascular: RRR, no MRG Lungs: diffuse rhonchi.  Abdomen: PEG in place, + BS, ostomy Musculoskeletal: Muscle wasting bilaterally. Skin: No rashes. Sacral wound vac in place.   LABS:  BMET  Recent Labs Lab 12/21/15 0527 12/22/15 0350 12/23/15 0530  NA 146* 145 143  K 4.9 4.8 4.9  CL 108 109 108  CO2 25 25 24   BUN 41* 41* 45*  CREATININE 2.27* 2.26* 2.26*  GLUCOSE 382* 347* 433*    Electrolytes  Recent Labs Lab 12/21/15 0527 12/22/15 0350 12/23/15 0530  CALCIUM 11.7* 11.7* 12.1*  MG 1.5*  --   --   PHOS 3.6  --   --     CBC  Recent Labs Lab 12/21/15 0527 12/22/15 0350 12/23/15 0530  WBC 27.3* 33.9* 29.6*  HGB 8.3* 7.7* 7.4*  HCT 28.5* 27.6* 26.4*  PLT 713* 722* 749*    Coag's No results for input(s): APTT, INR in the last 168 hours.  Sepsis Markers  Recent Labs Lab 12/18/15 2310  LATICACIDVEN 1.0    ABG  Recent Labs Lab 12/18/15 2355 12/19/15 0955 12/22/15 1145  PHART 7.324* 7.336* 7.365   PCO2ART 49.0* 48.8* 43.1  PO2ART 72.9* 93.2 86.9    Liver Enzymes  Recent Labs Lab 12/18/15 2310 12/23/15 0530  AST 21 15  ALT 13* 11*  ALKPHOS 81 104  BILITOT 0.6 0.3  ALBUMIN 2.0* 2.1*    Cardiac Enzymes No results for input(s): TROPONINI, PROBNP in the last 168 hours.  Glucose  Recent Labs Lab 12/22/15 2000 12/22/15 2223 12/23/15 0040 12/23/15 0456 12/23/15 0600 12/23/15 0723  GLUCAP 177* 219* 362* 359* 395* 331*    Imaging Portable Chest Xray  12/23/2015  CLINICAL DATA:  Pneumonia EXAM: PORTABLE CHEST 1 VIEW COMPARISON:  12/22/2015 FINDINGS: Right PICC and tracheostomy tube are stable. Bilateral airspace disease is stable. There is partial collapse of the right lower lobe which is stable. No pneumothorax. IMPRESSION: Stable bilateral airspace disease. Stable partial collapse of the right lower lobe. Electronically Signed   By: Marybelle Killings M.D.   On: 12/23/2015 08:14   PCX w/ marked worsening of right chest w/ sig patchy right sided airspace disease.   STUDIES:  CXR 02/28 > bibasilar airspace densities concerning for PNA. CT abdo/ pelvis>>>no fistula CXR 3/7 >>mildly increased bibasilar opacities are noted most consistent with subsegmental atelectasis and associated pleural effusions.  EEG 03/13 > mild to mod diffuse slowing with triphasic waves (could be seen  with hypoxic/ischemic injury, toxic/metabolic encephalopathies (hepatic?), med effect). CXR 3/14 >> Consolidation medial right base with small right effusion, left lung clear  CULTURES: Blood 02/28 >>No growth  Urine 02/28 >>Multiple species, suggest recollection Blood 3/1>> No growth  Sputum 3/6 >>> few pseudomonas >>>MDR Blood Culture 3/6 >> neg Urine Culture 3/7 >>Yeast  MRSA pos screen Blood 03/12 > GPC's > coag neg staph CDiff 3/13 >> Negative  Blood 3/14 >>   ANTIBIOTICS: Vanc 02/28 (7/7) >> 3/7 Levaquin 02/28 (7/7) >>3/7 Tigecycline 3/7 >> 3/9 Aztreonam 3/7 >> 3/9 Colistin 3/9 >>  3/13 Fluconazole 03/9 > Vanc 03/14 > Inhaled amikacin 3/16>>>  SIGNIFICANT EVENTS: 2/28  Admitted with probable HCAP 3/2- no fevers, low secretions, remains vented, refusing Chest PT and other treatments 3/3 improved to TC, pcxr better 3/5- trach collar 24  Hrs 3/6- Tmax 103.2 3/8 still spiking temps  3/9 Colistin started.  3/13: encephalopathic, rising creatinine, hyperkalemic.  3/14 > remains encephalopathic.  1/2 blood cx's with GPC's.  3/16 : unresponsive. Resp pattern worse, agonal. Adding inhaled amikacin, placed back on vent. Began talks w/ mother and father about EOL. They understand he may not survive. Mom not ready to make DNR, but says "she is prepared and won't let trent suffer".   LINES/TUBES: Chronic trach / PEG >> Suprapubic catheter changed 3/8  ASSESSMENT / PLAN:  PULMONARY A: Chronic respiratory failure with trach dependence - Now acute on Chronic respiratory failure on 3/16 w/ agonal resp pattern.  Tracheostomy status MDR pseudeomonas PNA -->concerned that his clinical decline reflects the result of [name] stopping treatment for the pseudomonas. We had hoped that this was a colonizer, but his rising WBC, resp distress & worsening CXR and overall clinical decline would reflect otherwise.  P:  Continue full vent support See ID section. CXR intermittently.  CARDIOVASCULAR A:  Chronic hypotension - on midodrine P:  Continue midodrine (dose adjusted up). Monitor hemodynamics.  RENAL Lab Results  Component Value Date   CREATININE 2.26* 12/23/2015   CREATININE 2.26* 12/22/2015   CREATININE 2.27* 12/21/2015   CREATININE 0.78 11/02/2015   A:   New AKI and hyperkalemia --> was hypotensive but also on Colistin: this has improved as of 0000000 Metabolic acidosis resolved P:   Cont NS @ 45ml/hr cont free water to 222ml every 6 hours Renal dose meds.  BMP in AM.  GASTROINTESTINAL A:   GERD, gastroparesis Nutrition R/o chronic fistula - NEG CT 3/4 P:    Pantoprazole. TF's at goal.  HEMATOLOGIC  Recent Labs  12/22/15 0350 12/23/15 0530  HGB 7.7* 7.4*   A:   Anemia - chronic, some dilution now Thrombocytosis - chronic VTE Prophylaxis P:  Transfuse for Hgb < 7. SCD's / heparin.  INFECTIOUS A:   Bacteremia - 1/2 blood cultures from 03/12 with GPC's.  Also with increasing leukocytosis as of 03/14 UTI - hx pseudomonal UTI in past with resistance to multiple abx - positive for yeast  HCAP-->pseudomonas MDR given clinical decline feel that this is more than a colonizer.  Hx sacral wounds P:   ID following - appreciate the assistance. Add inhaled amikacin  Vanc added back 03/14. Continue diflucan. WOC following.  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  Have recommended changes to novolog to include tube fee coverage > added 03/14. Repeated episodes hypoglycemia-->better  w/ reduced lantus, now 3/17 hyperglycemic Adrenal insuff - cortisol 12.7 but no need for additional steroids for now. P:   Continue SSI - tube coverage added 03/14. Lantus reduced to 5 units  started 3/13 because of repeated lows. Continue sensitive scale. Increased lantus 3/17 8 units  NEUROLOGIC A:   Acute encephalopathy - EEG 03/13 with mild to mod diffuse slowing with triphasic waves (could be seen with hypoxic/ischemic injury, toxic/metabolic encephalopathies (hepatic?), med effect). Ammonia, TSH negative Hx seizures (not on meds), paraplegia, fibromyalgia, diabetic neuropathy P:   D/cLyrica and Baclofen ns Would avoid benadryl (received 03/13 and was very somnolent after).  Family updated: Mother asleep at bedside   [facility] Minor ACNP [place] Pager [phone] till 3 pm If no answer page [phone] 12/23/2015, 9:48 AM  Attending Note:  I have examined patient, reviewed labs, studies and  notes. I have discussed the case with S Minor, and I agree with the data and plans as amended above.  Unfortunately very few options to treat his MDR Pseudomonas. He may not survive this hospitalization.    Baltazar Apo, MD, PhD 12/23/2015, 2:05 PM [facility] [phone] or if no answer [phone]

## 2015-12-23 NOTE — Progress Notes (Signed)
[facility] Progress Note Patient Name: Jason [name] DOB: 09-27-1986 MRN: [id number]   Date of Service  12/23/2015  HPI/Events of Note  Patient now alert c/o of pain.  Had been on oxy IR.  On Duragesic patch.  eICU Interventions  Plan: One time dose of 20 mg oxycodone. Rounding team to decide about PRN pain meds.     Intervention Category Intermediate Interventions: Pain - evaluation and management  DETERDING,ELIZABETH 12/23/2015, 4:56 AM

## 2015-12-23 NOTE — Progress Notes (Signed)
[facility] for Infectious Disease   Reason for visit: Follow up on MDR penumonia  Interval History: his renal function is stable.  Last fever yesterday afternoon, much more alert today.  Tolerating inhaled amikacin.  On vancomycin as well. Previous culture with MDR Pseudomonas.  CXR personally reviewed and right side a slight bit better   Physical Exam: Constitutional:  Filed Vitals:   12/23/15 1201 12/23/15 1306  BP: 155/94   Pulse: 75 76  Temp: 98 F (36.7 C)   Resp: 20 15  awake, somewhat interactive Eyes: anicteric HENT: + trach Respiratory: + rhonchi Cardiovascular: Tachy RR GI: soft, nt, nd  Review of Systems: Unable to be assessed due to mental status  Lab Results  Component Value Date   WBC 29.6* 12/23/2015   HGB 7.4* 12/23/2015   HCT 26.4* 12/23/2015   MCV 84.1 12/23/2015   PLT 749* 12/23/2015    Lab Results  Component Value Date   CREATININE 2.26* 12/23/2015   BUN 45* 12/23/2015   NA 143 12/23/2015   K 4.9 12/23/2015   CL 108 12/23/2015   CO2 24 12/23/2015    Lab Results  Component Value Date   ALT 11* 12/23/2015   AST 15 12/23/2015   ALKPHOS 104 12/23/2015     Microbiology: Recent Results (from the past 240 hour(s))  Culture, blood (Routine X 2) w Reflex to ID Panel     Status: None   Collection Time: 12/18/15  5:05 PM  Result Value Ref Range Status   Specimen Description LEFT ANTECUBITAL  Final   Special Requests BOTTLES DRAWN AEROBIC ONLY 8CC  Final   Culture  Setup Time   Final    GRAM POSITIVE COCCI IN CLUSTERS AEROBIC BOTTLE ONLY CRITICAL RESULT CALLED TO, READ BACK BY AND VERIFIED WITH: STEPHANIE VIVERITO,KRN AT 0827 12/20/15 BY K BARR    Culture   Final    STAPHYLOCOCCUS SPECIES (COAGULASE NEGATIVE) THE SIGNIFICANCE OF ISOLATING THIS ORGANISM FROM A SINGLE SET OF BLOOD CULTURES WHEN MULTIPLE SETS ARE DRAWN IS UNCERTAIN. PLEASE NOTIFY THE MICROBIOLOGY DEPARTMENT WITHIN ONE WEEK IF SPECIATION AND SENSITIVITIES ARE REQUIRED.    Report Status 12/23/2015 FINAL  Final  C difficile quick scan w PCR reflex     Status: None   Collection Time: 12/19/15  2:00 PM  Result Value Ref Range Status   C Diff antigen NEGATIVE NEGATIVE Final   C Diff toxin NEGATIVE NEGATIVE Final   C Diff interpretation Negative for toxigenic C. difficile  Final  Culture, blood (routine x 2)     Status: None (Preliminary result)   Collection Time: 12/20/15  3:20 PM  Result Value Ref Range Status   Specimen Description BLOOD LEFT ANTECUBITAL  Final   Special Requests BOTTLES DRAWN AEROBIC AND ANAEROBIC 5CC  Final   Culture NO GROWTH 2 DAYS  Final   Report Status PENDING  Incomplete  Culture, blood (routine x 2)     Status: None (Preliminary result)   Collection Time: 12/20/15  3:29 PM  Result Value Ref Range Status   Specimen Description BLOOD LEFT HAND  Final   Special Requests IN PEDIATRIC BOTTLE 3CC  Final   Culture NO GROWTH 2 DAYS  Final   Report Status PENDING  Incomplete    Impression/Plan:  1. MDR pneumonia - seems to be responding to inhaled amikacin.  If he again decompensates, only other option is ceftolozane/tazobactam, though he has a cephalosporin listed as an allergy (anaphyaxis) but mom understands this would  be his only other option.  He would be premedicated as per CCM note. But at this time he is improving.     2. CoNS blood culture - c/w contaminate with just 1 positive of multiple. No other new growth.

## 2015-12-23 NOTE — Progress Notes (Signed)
eLink Physician-Brief Progress Note Patient Name: MATISYAHU FEWKES DOB: 1986/05/06 MRN: [id number]   Date of Service  12/23/2015  HPI/Events of Note  Hyperglycemia with blood sugar greater than 350  eICU Interventions  Additional 9 units of novolog subq Recheck CBG in 1 hour     Intervention Category Intermediate Interventions: Hyperglycemia - evaluation and treatment  [place] 12/23/2015, 6:19 AM

## 2015-12-23 NOTE — Progress Notes (Signed)
Asked by pharmacy to review insulin recommendations per DM Coordinator.  Recommendations reviewed, changes made to current orders to cover for tube feeding.    Noe Gens, NP-C [facility] Pgr: [phone] or if no answer [phone] 12/23/2015, 1:29 PM

## 2015-12-24 DIAGNOSIS — D696 Thrombocytopenia, unspecified: Secondary | ICD-10-CM

## 2015-12-24 LAB — GLUCOSE, CAPILLARY
GLUCOSE-CAPILLARY: 291 mg/dL — AB (ref 65–99)
Glucose-Capillary: 214 mg/dL — ABNORMAL HIGH (ref 65–99)
Glucose-Capillary: 219 mg/dL — ABNORMAL HIGH (ref 65–99)
Glucose-Capillary: 263 mg/dL — ABNORMAL HIGH (ref 65–99)
Glucose-Capillary: 345 mg/dL — ABNORMAL HIGH (ref 65–99)
Glucose-Capillary: 385 mg/dL — ABNORMAL HIGH (ref 65–99)

## 2015-12-24 LAB — BASIC METABOLIC PANEL
ANION GAP: 10 (ref 5–15)
BUN: 42 mg/dL — AB (ref 6–20)
CALCIUM: 11.8 mg/dL — AB (ref 8.9–10.3)
CO2: 25 mmol/L (ref 22–32)
CREATININE: 1.99 mg/dL — AB (ref 0.61–1.24)
Chloride: 105 mmol/L (ref 101–111)
GFR calc Af Amer: 50 mL/min — ABNORMAL LOW (ref >60)
GFR, EST NON AFRICAN AMERICAN: 43 mL/min — AB (ref >60)
GLUCOSE: 274 mg/dL — AB (ref 65–99)
Potassium: 4.5 mmol/L (ref 3.5–5.1)
Sodium: 140 mmol/L (ref 135–145)

## 2015-12-24 MED ORDER — HYDRALAZINE HCL 20 MG/ML IJ SOLN: 5.0000 mg | Freq: Four times a day (QID) | INTRAMUSCULAR | Status: DC | PRN

## 2015-12-24 NOTE — Progress Notes (Signed)
Trach change.  Replaced tracheostomy tube with the same type - #6 Shiley cuffed.  Tube inflated and pt put back on vent at same setting.  There was no complications procedure went well

## 2015-12-24 NOTE — Progress Notes (Addendum)
[facility] for Infectious Disease   Reason for visit: Follow up on MDR penumonia  Interval History: fever up to 103 today. Tolerating inhaled amikacin.  On vancomycin as well. Previous culture with MDR Pseudomonas.    Physical Exam: Constitutional:  Filed Vitals:   12/24/15 1148 12/24/15 1307  BP: 156/94 167/100  Pulse: 98 108  Temp:  103.1 F (39.5 C)  Resp: 19 21  awake, somewhat interactive Eyes: anicteric HENT: + trach Respiratory: + rhonchi Cardiovascular: Tachy RR GI: soft, nt, nd  Review of Systems: Unable to be assessed due to mental status  Lab Results  Component Value Date   WBC 29.6* 12/23/2015   HGB 7.4* 12/23/2015   HCT 26.4* 12/23/2015   MCV 84.1 12/23/2015   PLT 749* 12/23/2015    Lab Results  Component Value Date   CREATININE 1.99* 12/24/2015   BUN 42* 12/24/2015   NA 140 12/24/2015   K 4.5 12/24/2015   CL 105 12/24/2015   CO2 25 12/24/2015    Lab Results  Component Value Date   ALT 11* 12/23/2015   AST 15 12/23/2015   ALKPHOS 104 12/23/2015     Microbiology: Recent Results (from the past 240 hour(s))  Culture, blood (Routine X 2) w Reflex to ID Panel     Status: None   Collection Time: 12/18/15  5:05 PM  Result Value Ref Range Status   Specimen Description LEFT ANTECUBITAL  Final   Special Requests BOTTLES DRAWN AEROBIC ONLY 8CC  Final   Culture  Setup Time   Final    GRAM POSITIVE COCCI IN CLUSTERS AEROBIC BOTTLE ONLY CRITICAL RESULT CALLED TO, READ BACK BY AND VERIFIED WITH: STEPHANIE VIVERITO,KRN AT 0827 12/20/15 BY K BARR    Culture   Final    STAPHYLOCOCCUS SPECIES (COAGULASE NEGATIVE) THE SIGNIFICANCE OF ISOLATING THIS ORGANISM FROM A SINGLE SET OF BLOOD CULTURES WHEN MULTIPLE SETS ARE DRAWN IS UNCERTAIN. PLEASE NOTIFY THE MICROBIOLOGY DEPARTMENT WITHIN ONE WEEK IF SPECIATION AND SENSITIVITIES ARE REQUIRED.    Report Status 12/23/2015 FINAL  Final  C difficile quick scan w PCR reflex     Status: None   Collection  Time: 12/19/15  2:00 PM  Result Value Ref Range Status   C Diff antigen NEGATIVE NEGATIVE Final   C Diff toxin NEGATIVE NEGATIVE Final   C Diff interpretation Negative for toxigenic C. difficile  Final  Culture, blood (routine x 2)     Status: None (Preliminary result)   Collection Time: 12/20/15  3:20 PM  Result Value Ref Range Status   Specimen Description BLOOD LEFT ANTECUBITAL  Final   Special Requests BOTTLES DRAWN AEROBIC AND ANAEROBIC 5CC  Final   Culture NO GROWTH 3 DAYS  Final   Report Status PENDING  Incomplete  Culture, blood (routine x 2)     Status: None (Preliminary result)   Collection Time: 12/20/15  3:29 PM  Result Value Ref Range Status   Specimen Description BLOOD LEFT HAND  Final   Special Requests IN PEDIATRIC BOTTLE 3CC  Final   Culture NO GROWTH 3 DAYS  Final   Report Status PENDING  Incomplete    Impression/Plan:  1. MDR pneumonia - continue on inhaled amikacin.difficult to tell if fever is persistent. Won't change abtx for now. If we decide to change regimen, our only other option is ceftolozane/tazobactam, though he has a cephalosporin listed as an allergy (anaphyaxis) which would entail possibly doing desensitization plus premedicated as per CCM note. But at  this time he is improving.   Will repeat trach culture  2. CoNS blood culture - c/w contaminate with just 1 positive of multiple. No other new growth.   3. Thrombocytosis = likely due to polymicrobial pressure wounds.

## 2015-12-24 NOTE — Progress Notes (Signed)
PULMONARY / CRITICAL CARE MEDICINE   Name: Jason [name] MRN: [id number] DOB: 24-Sep-1986    ADMISSION DATE:  12/06/2015 CONSULTATION DATE:  12/06/15  REFERRING MD:  EDP  CHIEF COMPLAINT:  Decrease in O2 sats, fever, PNA  SUBJECTIVE: Unresponsive.   VITAL SIGNS: BP 150/99 mmHg  Pulse 103  Temp(Src) 103.1 F (39.5 C) (Oral)  Resp 25  Ht 5\' 8"  (1.727 m)  Wt 143 lb 3.2 oz (64.955 kg)  BMI 21.78 kg/m2  SpO2 100%   VENTILATOR SETTINGS: Vent Mode:  [-] PRVC FiO2 (%):  [28 %-30 %] 30 % Set Rate:  [14 bmp] 14 bmp Vt Set:  [550 mL] 550 mL PEEP:  [5 cmH20] 5 cmH20 Plateau Pressure:  [20 cmH20] 20 cmH20  INTAKE / OUTPUT: I/O last 3 completed shifts: In: [id number] [I.V.:2025; NG/GT:2155] Out: 4250 [Urine:3525; Stool:725]   PHYSICAL EXAMINATION: General: Chronically ill --> now essentially unresponsive on full vent support Neuro: Opens eyes to stimuli but falls back to sleep quickly. HEENT: PERRL, Clean trach site. Cardiovascular: RRR, no MRG Lungs: diffuse rhonchi.  Abdomen: PEG in place, + BS, ostomy Musculoskeletal: Muscle wasting bilaterally. Skin: No rashes. Sacral wound vac in place.   LABS:  BMET  Recent Labs Lab 12/22/15 0350 12/23/15 0530 12/24/15 0500  NA 145 143 140  K 4.8 4.9 4.5  CL 109 108 105  CO2 25 24 25   BUN 41* 45* 42*  CREATININE 2.26* 2.26* 1.99*  GLUCOSE 347* 433* 274*    Electrolytes  Recent Labs Lab 12/21/15 0527 12/22/15 0350 12/23/15 0530 12/24/15 0500  CALCIUM 11.7* 11.7* 12.1* 11.8*  MG 1.5*  --   --   --   PHOS 3.6  --   --   --     CBC  Recent Labs Lab 12/21/15 0527 12/22/15 0350 12/23/15 0530  WBC 27.3* 33.9* 29.6*  HGB 8.3* 7.7* 7.4*  HCT 28.5* 27.6* 26.4*  PLT 713* 722* 749*    Coag's No results for input(s): APTT, INR in the last 168 hours.  Sepsis Markers  Recent Labs Lab 12/18/15 2310  LATICACIDVEN 1.0    ABG  Recent Labs Lab 12/18/15 2355 12/19/15 0955 12/22/15 1145  PHART 7.324*  7.336* 7.365  PCO2ART 49.0* 48.8* 43.1  PO2ART 72.9* 93.2 86.9    Liver Enzymes  Recent Labs Lab 12/18/15 2310 12/23/15 0530  AST 21 15  ALT 13* 11*  ALKPHOS 81 104  BILITOT 0.6 0.3  ALBUMIN 2.0* 2.1*    Cardiac Enzymes No results for input(s): TROPONINI, PROBNP in the last 168 hours.  Glucose  Recent Labs Lab 12/23/15 1657 12/23/15 1949 12/23/15 2359 12/24/15 0433 12/24/15 0759 12/24/15 1311  GLUCAP 291* 343* 263* 219* 214* 385*    Imaging No results found.   STUDIES:  CXR 02/28 > bibasilar airspace densities concerning for PNA. CT abdo/ pelvis>>>no fistula CXR 3/7 >>mildly increased bibasilar opacities are noted most consistent with subsegmental atelectasis and associated pleural effusions.  EEG 03/13 > mild to mod diffuse slowing with triphasic waves (could be seen with hypoxic/ischemic injury, toxic/metabolic encephalopathies (hepatic?), med effect). CXR 3/14 >> Consolidation medial right base with small right effusion, left lung clear  CULTURES: Blood 02/28 >>No growth  Urine 02/28 >>Multiple species, suggest recollection Blood 3/1>> No growth  Sputum 3/6 >>> few pseudomonas >>>MDR Blood Culture 3/6 >> neg Urine Culture 3/7 >>Yeast  MRSA pos screen Blood 03/12 > GPC's > coag neg staph CDiff 3/13 >> Negative  Blood 3/14 >>   ANTIBIOTICS:  Vanc 02/28 (7/7) >> 3/7 Levaquin 02/28 (7/7) >>3/7 Tigecycline 3/7 >> 3/9 Aztreonam 3/7 >> 3/9 Colistin 3/9 >> 3/13 Fluconazole 03/9 > Vanc 03/14 > Inhaled amikacin 3/16>>>  SIGNIFICANT EVENTS: 2/28  Admitted with probable HCAP 3/2- no fevers, low secretions, remains vented, refusing Chest PT and other treatments 3/3 improved to TC, pcxr better 3/5- trach collar 24  Hrs 3/6- Tmax 103.2 3/8 still spiking temps  3/9 Colistin started.  3/13: encephalopathic, rising creatinine, hyperkalemic.  3/14 > remains encephalopathic.  1/2 blood cx's with GPC's.  3/16 : unresponsive. Resp pattern worse, agonal.  Adding inhaled amikacin, placed back on vent. Began talks w/ mother and father about EOL. They understand he may not survive. Mom not ready to make DNR, but says "she is prepared and won't let trent suffer".   LINES/TUBES: Chronic trach / PEG >> Suprapubic catheter changed 3/8  ASSESSMENT / PLAN:  PULMONARY A: Chronic respiratory failure with trach dependence - Now acute on Chronic respiratory failure on 3/16 w/ agonal resp pattern.  Tracheostomy status MDR pseudeomonas PNA -->concerned that his clinical decline reflects the result of [name] stopping treatment for the pseudomonas. We had hoped that this was a colonizer, but his rising WBC, resp distress & worsening CXR and overall clinical decline would reflect otherwise.  P:  Continue full vent support See ID section. CXR intermittently.  CARDIOVASCULAR A:  Chronic hypotension - on midodrine P:  Hold midodrine as he has been hypertensive Monitor hemodynamics.  RENAL Lab Results  Component Value Date   CREATININE 1.99* 12/24/2015   CREATININE 2.26* 12/23/2015   CREATININE 2.26* 12/22/2015   CREATININE 0.78 11/02/2015    Recent Labs Lab 12/22/15 0350 12/23/15 0530 12/24/15 0500  K 4.8 4.9 4.5     A:   New AKI and hyperkalemia --> was hypotensive but also on Colistin: this has improved as of 0000000 Metabolic acidosis resolved P:   Cont NS @ 26ml/hr cont free water to 214ml every 6 hours Renal dose meds.  BMP in AM.  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 TF's at goal.  HEMATOLOGIC  Recent Labs  12/22/15 0350 12/23/15 0530  HGB 7.7* 7.4*   A:   Anemia - chronic, some dilution now Thrombocytosis - chronic VTE Prophylaxis P:  Transfuse for Hgb < 7. SCD's / heparin.  INFECTIOUS A:   Bacteremia - 1/2 blood cultures from 03/12 with GPC's.  Also with increasing leukocytosis as of 03/14 UTI - hx pseudomonal UTI in past with resistance to  multiple abx - positive for yeast  HCAP-->pseudomonas MDR given clinical decline feel that this is more than a colonizer.  Hx sacral wounds P:   ID following - appreciate the assistance. On inhaled amikacin, has been febrile. Very difficult situation with resistant organism Continue diflucan.>> WOC following.  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  Have recommended changes to novolog to include tube fee coverage > added 03/14. Repeated episodes hypoglycemia-->better w/ reduced lantus, now 3/17 hyperglycemic Adrenal insuff - cortisol 12.7 but no need for additional steroids for now. P:   Continue SSI - tube coverage added 03/14. Lantus reduced to 5 units  started 3/13 because of repeated lows. Continue sensitive scale. Increased lantus 3/17 8 units 3/18 increased to 10  units  NEUROLOGIC A:   Acute encephalopathy - EEG 03/13 with mild to mod diffuse slowing with triphasic waves (could be seen with hypoxic/ischemic injury, toxic/metabolic encephalopathies (hepatic?), med effect). Ammonia, TSH negative Hx seizures (not on meds), paraplegia, fibromyalgia, diabetic neuropathy P:   D/cLyrica and Baclofen ns Would avoid benadryl (received 03/13 and was very somnolent after).  Family updated: Mother updated   [name] Minor ACNP [name] PCCM Pager [phone] till 3 pm If no answer page [phone] 12/24/2015, 4:11 PM   Attending Note:  I have examined patient, reviewed labs, studies and notes. I have discussed the case with S Minor, and I agree with the data and plans as amended above. Attemp[ting to treat with inhaled amikacin, ? Whether we will have to add additional rx. Difficult to treat this organism. Will hold his midodrine due to HTN.   Baltazar Apo, MD, PhD 12/24/2015, 5:30 PM [facility] [phone] or if no  answer [phone]

## 2015-12-24 NOTE — Progress Notes (Signed)
Suctioned for sputum

## 2015-12-25 LAB — GLUCOSE, CAPILLARY
GLUCOSE-CAPILLARY: 128 mg/dL — AB (ref 65–99)
GLUCOSE-CAPILLARY: 199 mg/dL — AB (ref 65–99)
GLUCOSE-CAPILLARY: 211 mg/dL — AB (ref 65–99)
GLUCOSE-CAPILLARY: 319 mg/dL — AB (ref 65–99)
GLUCOSE-CAPILLARY: 380 mg/dL — AB (ref 65–99)
Glucose-Capillary: 247 mg/dL — ABNORMAL HIGH (ref 65–99)

## 2015-12-25 LAB — CULTURE, BLOOD (ROUTINE X 2)
Culture: NO GROWTH
Culture: NO GROWTH

## 2015-12-25 LAB — VANCOMYCIN, RANDOM: VANCOMYCIN RM: 15 ug/mL

## 2015-12-25 MED ORDER — MUPIROCIN 2 % EX OINT
TOPICAL_OINTMENT | CUTANEOUS | Status: AC
  Filled 2015-12-25: qty 22

## 2015-12-25 MED ORDER — VANCOMYCIN HCL 500 MG IV SOLR
500.0000 mg | Freq: Once | INTRAVENOUS | Status: AC
  Administered 2015-12-25: 500 mg via INTRAVENOUS
  Filled 2015-12-25: qty 500

## 2015-12-25 MED ORDER — LORAZEPAM 2 MG/ML IJ SOLN
INTRAMUSCULAR | Status: AC
  Filled 2015-12-25: qty 1

## 2015-12-25 MED ORDER — LORAZEPAM 2 MG/ML IJ SOLN
1.0000 mg | INTRAMUSCULAR | Status: DC | PRN
  Administered 2015-12-26 – 2015-12-27 (×2): 2 mg via INTRAVENOUS
  Filled 2015-12-25 (×2): qty 1

## 2015-12-25 NOTE — Progress Notes (Signed)
[facility] for Infectious Disease   Reason for visit: Follow up on MDR penumonia  Interval History: afebrile this morning, though had tmax of 102 this afternoon. Mother reports that he has been encephalopathic with incoherent talk this morning, poor sleep overnight. Tolerating inhaled amikacin.  Underwent trach change without difficulty    Physical Exam: Constitutional:  Filed Vitals:   12/25/15 0738 12/25/15 0825  BP: 123/88 123/86  Pulse: 93 91  Temp:  97.6 F (36.4 C)  Resp: 14 16  asleep,  Eyes: anicteric HENT: + trach Respiratory: + rhonchi Cardiovascular: Tachy RR GI: soft, nt, nd   Lab Results  Component Value Date   WBC 29.6* 12/23/2015   HGB 7.4* 12/23/2015   HCT 26.4* 12/23/2015   MCV 84.1 12/23/2015   PLT 749* 12/23/2015    Lab Results  Component Value Date   CREATININE 1.99* 12/24/2015   BUN 42* 12/24/2015   NA 140 12/24/2015   K 4.5 12/24/2015   CL 105 12/24/2015   CO2 25 12/24/2015    Lab Results  Component Value Date   ALT 11* 12/23/2015   AST 15 12/23/2015   ALKPHOS 104 12/23/2015     Microbiology: Recent Results (from the past 240 hour(s))  Culture, blood (Routine X 2) w Reflex to ID Panel     Status: None   Collection Time: 12/18/15  5:05 PM  Result Value Ref Range Status   Specimen Description LEFT ANTECUBITAL  Final   Special Requests BOTTLES DRAWN AEROBIC ONLY 8CC  Final   Culture  Setup Time   Final    GRAM POSITIVE COCCI IN CLUSTERS AEROBIC BOTTLE ONLY CRITICAL RESULT CALLED TO, READ BACK BY AND VERIFIED WITH: STEPHANIE VIVERITO,KRN AT 0827 12/20/15 BY K BARR    Culture   Final    STAPHYLOCOCCUS SPECIES (COAGULASE NEGATIVE) THE SIGNIFICANCE OF ISOLATING THIS ORGANISM FROM A SINGLE SET OF BLOOD CULTURES WHEN MULTIPLE SETS ARE DRAWN IS UNCERTAIN. PLEASE NOTIFY THE MICROBIOLOGY DEPARTMENT WITHIN ONE WEEK IF SPECIATION AND SENSITIVITIES ARE REQUIRED.    Report Status 12/23/2015 FINAL  Final  C difficile quick scan w PCR  reflex     Status: None   Collection Time: 12/19/15  2:00 PM  Result Value Ref Range Status   C Diff antigen NEGATIVE NEGATIVE Final   C Diff toxin NEGATIVE NEGATIVE Final   C Diff interpretation Negative for toxigenic C. difficile  Final  Culture, blood (routine x 2)     Status: None (Preliminary result)   Collection Time: 12/20/15  3:20 PM  Result Value Ref Range Status   Specimen Description BLOOD LEFT ANTECUBITAL  Final   Special Requests BOTTLES DRAWN AEROBIC AND ANAEROBIC 5CC  Final   Culture NO GROWTH 4 DAYS  Final   Report Status PENDING  Incomplete  Culture, blood (routine x 2)     Status: None (Preliminary result)   Collection Time: 12/20/15  3:29 PM  Result Value Ref Range Status   Specimen Description BLOOD LEFT HAND  Final   Special Requests IN PEDIATRIC BOTTLE 3CC  Final   Culture NO GROWTH 4 DAYS  Final   Report Status PENDING  Incomplete    Impression/Plan:  1. MDR pneumonia - continue on inhaled amikacin for now.  Repeat resp culture pending. since patient continues to do poorly today, we will plan to determine how to desensitive to cefepime vs. zyrbaxa since he has cephalosporin allergy, probably tomorrow with close observations and premedicaiton  2. CoNS blood culture -  c/w contaminate with just 1 positive of multiple. No other new growth.   3. Thrombocytosis = likely due to pneumonia and polymicrobial pressure wounds.  4. Pressure wounds = continue to follow wound care recs

## 2015-12-25 NOTE — Progress Notes (Signed)
Pt has an ingrown toenail Rt great toe. Foot soaked with warm washcloths and towels. Pt's mother trimmed toenails on Rt foot - says she does them at home and she has the right tools to do the thickened toenails. Area dressed & elevated Noted a small amt of bleeding from the area where the sharp, ingrown toenail was cut.

## 2015-12-25 NOTE — Progress Notes (Signed)
Received a call from RN that patient having upper arm movement in the setting of high temp of 104F concerning for seizure.She is also calling primary.  Recommend to attempt to keep temperature below 101. Can also consider giving ibuprofen in addition to cooling blanket  Repeat culture - resp sent. rec to repeat blood cx

## 2015-12-25 NOTE — Progress Notes (Signed)
Pharmacy Antibiotic Note  Jason [name] is a 30 y.o. male admitted on 12/06/2015 with MDR pseudomonas PNA.  Pharmacy has been consulted for inhaled amikacin and vancomycin dosing.   He recently finished a course of abx for HAP but continues to show signs of infection. Tmax 103.1, currently afebrile. AKI from colistin is starting to improve. His VR today returned at 15, with his last dose of vancomycin given 3/16. He is currently on day 11 of fluconazole for yeast in his urine.  Plan: Vancomycin 500mg  IV once Continue inhaled Amikacin 500 mg BID 24h VR Monitor culture data, renal function, VT at Css   Height: 5\' 8"  (172.7 cm) Weight: 118 lb (53.524 kg) IBW/kg (Calculated) : 68.4  Temp (24hrs), Avg:101 F (38.3 C), Min:98.6 F (37 C), Max:103.1 F (39.5 C)   Recent Labs Lab 12/18/15 2310 12/20/15 0418 12/20/15 1531 12/21/15 0527 12/22/15 0350 12/23/15 0530 12/24/15 0500 12/25/15 0500  WBC 17.9* 22.9*  --  27.3* 33.9* 29.6*  --   --   CREATININE 1.57* 1.97* 2.13* 2.27* 2.26* 2.26* 1.99*  --   LATICACIDVEN 1.0  --   --   --   --   --   --   --   VANCORANDOM  --   --   --   --   --  35  --  15    Estimated Creatinine Clearance: 40.7 mL/min (by C-G formula based on Cr of 1.99).    Allergies  Allergen Reactions  . Cefuroxime Axetil Anaphylaxis  . Ertapenem Other (See Comments)    Rash and confusion-->tolerated Imipenem   . Morphine And Related Other (See Comments)    Changed mental status, confusion, headache, visual hallucination  . Penicillins Anaphylaxis and Other (See Comments)    Tolerated Imipenem; no reaction to 7 day course of amoxicillin in 2015 Has patient had a PCN reaction causing immediate rash, facial/tongue/throat swelling, SOB or lightheadedness with hypotension: Yes Has patient had a PCN reaction causing severe rash involving mucus membranes or skin necrosis: No Has patient had a PCN reaction that required hospitalization Yes Has patient had a PCN  reaction occurring within the last 10 years: No If all of the above answers are "NO", then may proceed with Cephalo  . Sulfa Antibiotics Anaphylaxis, Shortness Of Breath and Other (See Comments)  . Tessalon [Benzonatate] Anaphylaxis  . Shellfish Allergy Itching and Other (See Comments)    Took benadryl to alleviate reaction  . Levaquin [Levofloxacin In D5w] Swelling    Hand and forearm swelling  . Nsaids Itching and Other (See Comments)    Risk of bleeding, itching  . Miripirium Rash and Other (See Comments)    Change in mental status    Antimicrobials this admission: Vanc 3/1 >> 3/7, 3/14 >> Levaquin 3/1 >> 3/7 Primaxin x 1 2/28 Aztreonam 3/7 >> 3/9 Tigecycline 3/7 >> 3/9 Colistin 3/9 >> 3/13 Fluc 3/9 >>  Dose adjustments this admission: 3/2 VT = 19 3/17 0530 VR = 35 (last dose 500 mg on 3/16 at 1022) 3/19 VR = 15  Microbiology results: 2/28 UCx >> mult species FINAL 2/28 BCx x2>> ngF 3/1 BCx x2>> ngF 3/1 MRSA PCR >> positive 3/6 TA - MDR Pseudomonas (sensitive to Amikacin) 3/6  BCx x 2>> ngF 3/7  Urine:  > 100K yeast 3/13 cdiff: neg 3/12 BCx 1/2 > CoNS - pending 3/14 BCx x2 > NG x 2 days   Andrey Cota. Diona Foley, PharmD, [place] Clinical Pharmacist Pager [phone]  12/25/2015  8:10 AM

## 2015-12-25 NOTE — Progress Notes (Signed)
PULMONARY / CRITICAL CARE MEDICINE   Name: Jason [name] MRN: [id number] DOB: 05-25-1986    ADMISSION DATE:  12/06/2015 CONSULTATION DATE:  12/06/15  REFERRING MD:  EDP  CHIEF COMPLAINT:  Decrease in O2 sats, fever, PNA  SUBJECTIVE: Continues to have fevers Has not come off the ventilator today   VITAL SIGNS: BP 102/76 mmHg  Pulse 93  Temp(Src) 104.6 F (40.3 C) (Oral)  Resp 16  Ht 5\' 8"  (1.727 m)  Wt 53.524 kg (118 lb)  BMI 17.95 kg/m2  SpO2 100%   VENTILATOR SETTINGS: Vent Mode:  [-] PRVC FiO2 (%):  [30 %] 30 % Set Rate:  [14 bmp] 14 bmp Vt Set:  [550 mL] 550 mL PEEP:  [5 cmH20] 5 cmH20 Plateau Pressure:  [18 cmH20-28 cmH20] 18 cmH20  INTAKE / OUTPUT: I/O last 3 completed shifts: In: [id number] [I.V.:2400; NG/GT:3130] Out: 5350 [Urine:5050; Stool:300]   PHYSICAL EXAMINATION: General: Chronically ill --> interacts some Neuro: Opens eyes , gestures to his mother and communicates with her HEENT: PERRL, Clean trach site. Cardiovascular: RRR, no MRG Lungs: diffuse rhonchi.  Abdomen: PEG in place, + BS, ostomy Musculoskeletal: Muscle wasting bilaterally. Skin: No rashes. Sacral wound vac in place.   LABS:  BMET  Recent Labs Lab 12/22/15 0350 12/23/15 0530 12/24/15 0500  NA 145 143 140  K 4.8 4.9 4.5  CL 109 108 105  CO2 25 24 25   BUN 41* 45* 42*  CREATININE 2.26* 2.26* 1.99*  GLUCOSE 347* 433* 274*    Electrolytes  Recent Labs Lab 12/21/15 0527 12/22/15 0350 12/23/15 0530 12/24/15 0500  CALCIUM 11.7* 11.7* 12.1* 11.8*  MG 1.5*  --   --   --   PHOS 3.6  --   --   --     CBC  Recent Labs Lab 12/21/15 0527 12/22/15 0350 12/23/15 0530  WBC 27.3* 33.9* 29.6*  HGB 8.3* 7.7* 7.4*  HCT 28.5* 27.6* 26.4*  PLT 713* 722* 749*    Coag's No results for input(s): APTT, INR in the last 168 hours.  Sepsis Markers  Recent Labs Lab 12/18/15 2310  LATICACIDVEN 1.0    ABG  Recent Labs Lab 12/18/15 2355 12/19/15 0955 12/22/15 1145   PHART 7.324* 7.336* 7.365  PCO2ART 49.0* 48.8* 43.1  PO2ART 72.9* 93.2 86.9    Liver Enzymes  Recent Labs Lab 12/18/15 2310 12/23/15 0530  AST 21 15  ALT 13* 11*  ALKPHOS 81 104  BILITOT 0.6 0.3  ALBUMIN 2.0* 2.1*    Cardiac Enzymes No results for input(s): TROPONINI, PROBNP in the last 168 hours.  Glucose  Recent Labs Lab 12/24/15 1922 12/24/15 2351 12/25/15 0438 12/25/15 0822 12/25/15 1242 12/25/15 1733  GLUCAP 291* 199* 380* 319* 247* 128*    Imaging No results found.   STUDIES:  CXR 02/28 > bibasilar airspace densities concerning for PNA. CT abdo/ pelvis>>>no fistula CXR 3/7 >>mildly increased bibasilar opacities are noted most consistent with subsegmental atelectasis and associated pleural effusions.  EEG 03/13 > mild to mod diffuse slowing with triphasic waves (could be seen with hypoxic/ischemic injury, toxic/metabolic encephalopathies (hepatic?), med effect). CXR 3/14 >> Consolidation medial right base with small right effusion, left lung clear  CULTURES: Blood 02/28 >>No growth  Urine 02/28 >>Multiple species, suggest recollection Blood 3/1>> No growth  Sputum 3/6 >>> few pseudomonas >>>MDR Blood Culture 3/6 >> neg Urine Culture 3/7 >>Yeast  MRSA pos screen Blood 03/12 > GPC's > coag neg staph CDiff 3/13 >> Negative  Blood 3/14 >>  ANTIBIOTICS: Vanc 02/28 (7/7) >> 3/7 Levaquin 02/28 (7/7) >>3/7 Tigecycline 3/7 >> 3/9 Aztreonam 3/7 >> 3/9 Colistin 3/9 >> 3/13 Fluconazole 03/9 > Vanc 03/14 > Inhaled amikacin 3/16>>>  SIGNIFICANT EVENTS: 2/28  Admitted with probable HCAP 3/2- no fevers, low secretions, remains vented, refusing Chest PT and other treatments 3/3 improved to TC, pcxr better 3/5- trach collar 24  Hrs 3/6- Tmax 103.2 3/8 still spiking temps  3/9 Colistin started.  3/13: encephalopathic, rising creatinine, hyperkalemic.  3/14 > remains encephalopathic.  1/2 blood cx's with GPC's.  3/16 : unresponsive. Resp pattern  worse, agonal. Adding inhaled amikacin, placed back on vent. Began talks w/ mother and father about EOL. They understand he may not survive. Mom not ready to make DNR, but says "she is prepared and won't let trent suffer".   LINES/TUBES: Chronic trach / PEG >> Suprapubic catheter changed 3/8  ASSESSMENT / PLAN:  PULMONARY A: Chronic respiratory failure with trach dependence - Now acute on Chronic respiratory failure on 3/16 w/ agonal resp pattern.  Tracheostomy status MDR pseudeomonas PNA -->concerned that his clinical decline reflects the result of [name] stopping treatment for the pseudomonas. We had hoped that this was a colonizer, but his rising WBC, resp distress & worsening CXR and overall clinical decline would reflect otherwise.  P:  Continue full vent support See ID section. CXR intermittently.  CARDIOVASCULAR A:  Chronic hypotension - on midodrine P:  Hold midodrine as he has been hypertensive Monitor hemodynamics.  RENAL Lab Results  Component Value Date   CREATININE 1.99* 12/24/2015   CREATININE 2.26* 12/23/2015   CREATININE 2.26* 12/22/2015   CREATININE 0.78 11/02/2015    Recent Labs Lab 12/22/15 0350 12/23/15 0530 12/24/15 0500  K 4.8 4.9 4.5     A:   New AKI and hyperkalemia --> was hypotensive but also on Colistin, slowly improving Metabolic acidosis resolved P:   Cont NS @ 47ml/hr cont free water to 232ml every 6 hours Renal dose meds.  BMP in AM.  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 TF's at goal.  HEMATOLOGIC  Recent Labs  12/23/15 0530  HGB 7.4*   A:   Anemia - chronic, some dilution now Thrombocytosis - chronic VTE Prophylaxis P:  Transfuse for Hgb < 7. SCD's / heparin.  INFECTIOUS A:   Bacteremia - 1/2 blood cultures from 03/12 with GPC's.  Also with increasing leukocytosis as of 03/14 UTI - hx pseudomonal UTI in past with resistance to multiple abx -  positive for yeast  HCAP-->pseudomonas MDR given clinical decline feel that this is more than a colonizer.  Hx sacral wounds P:   ID following - appreciate the assistance. On inhaled amikacin, has been febrile. Very difficult situation with resistant organism. Note possible plans to restart cefepime or zyrbaxa.  Continue diflucan.>> WOC following.  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  Have recommended changes to novolog to include tube fee coverage > added 03/14. Repeated episodes hypoglycemia-->better w/ reduced lantus, now 3/17 hyperglycemic Adrenal insuff - cortisol 12.7 but no need for additional steroids for now. P:   Continue SSI - tube coverage added 03/14. Lantus reduced to 5 units  started 3/13 because of repeated lows. Continue sensitive scale. Increased lantus 3/17 8 units 3/18  increased to 10 units  NEUROLOGIC A:   Acute encephalopathy - EEG 03/13 with mild to mod diffuse slowing with triphasic waves (could be seen with hypoxic/ischemic injury, toxic/metabolic encephalopathies (hepatic?), med effect). Ammonia, TSH negative Hx seizures (not on meds), paraplegia, fibromyalgia, diabetic neuropathy P:   D/c Lyrica and Baclofen ns Would avoid benadryl (received 03/13 and was very somnolent after).  Family updated: Mother updated    Baltazar Apo, MD, PhD 12/25/2015, 6:23 PM [facility] [phone] or if no answer [phone]

## 2015-12-25 NOTE — Progress Notes (Signed)
Pt running a fever of 102.4 oral - bathed with a cool rag and given tylenol 650 mg per Gtube.

## 2015-12-25 NOTE — Progress Notes (Addendum)
Mother called for RN to come to room. Pt having some tonic, clonic jerking movements of upper extremities - rechecked temp now 104.3 oral. Ice packs to bilat axillary areas and placed in groin. Call to MD to make aware. DR Lamonte Sakai called back - order for cooling blanket to keep temp < 102

## 2015-12-25 NOTE — Progress Notes (Signed)
[facility] Progress Note Patient Name: Jason [name] DOB: 01-22-1986 MRN: [id number]   Date of Service  12/25/2015  HPI/Events of Note  Intermittent seizure activity  eICU Interventions  Give lorazepam     Intervention Category Major Interventions: Seizures - evaluation and management  Asencion Noble 12/25/2015, 9:27 PM

## 2015-12-26 ENCOUNTER — Encounter: Payer: Self-pay | Admitting: Infectious Disease

## 2015-12-26 DIAGNOSIS — J151 Pneumonia due to Pseudomonas: Secondary | ICD-10-CM

## 2015-12-26 DIAGNOSIS — J690 Pneumonitis due to inhalation of food and vomit: Secondary | ICD-10-CM | POA: Diagnosis present

## 2015-12-26 DIAGNOSIS — B965 Pseudomonas (aeruginosa) (mallei) (pseudomallei) as the cause of diseases classified elsewhere: Secondary | ICD-10-CM

## 2015-12-26 DIAGNOSIS — J9610 Chronic respiratory failure, unspecified whether with hypoxia or hypercapnia: Secondary | ICD-10-CM

## 2015-12-26 DIAGNOSIS — R569 Unspecified convulsions: Secondary | ICD-10-CM

## 2015-12-26 DIAGNOSIS — R509 Fever, unspecified: Secondary | ICD-10-CM

## 2015-12-26 DIAGNOSIS — J95851 Ventilator associated pneumonia: Secondary | ICD-10-CM

## 2015-12-26 LAB — GLUCOSE, CAPILLARY
GLUCOSE-CAPILLARY: 350 mg/dL — AB (ref 65–99)
GLUCOSE-CAPILLARY: 393 mg/dL — AB (ref 65–99)
GLUCOSE-CAPILLARY: 221 mg/dL — AB (ref 65–99)
Glucose-Capillary: 239 mg/dL — ABNORMAL HIGH (ref 65–99)
Glucose-Capillary: 198 mg/dL — ABNORMAL HIGH (ref 65–99)
Glucose-Capillary: 425 mg/dL — ABNORMAL HIGH (ref 65–99)
Glucose-Capillary: 473 mg/dL — ABNORMAL HIGH (ref 65–99)
Glucose-Capillary: 343 mg/dL — ABNORMAL HIGH (ref 65–99)
Glucose-Capillary: 209 mg/dL — ABNORMAL HIGH (ref 65–99)

## 2015-12-26 LAB — PREPARE RBC (CROSSMATCH)

## 2015-12-26 LAB — CBC
HCT: 21.9 % — ABNORMAL LOW (ref 39.0–52.0)
HCT: 25.7 % — ABNORMAL LOW (ref 39.0–52.0)
Hemoglobin: 6.5 g/dL — CL (ref 13.0–17.0)
Hemoglobin: 7.8 g/dL — ABNORMAL LOW (ref 13.0–17.0)
MCH: 24.1 pg — ABNORMAL LOW (ref 26.0–34.0)
MCH: 24.4 pg — AB (ref 26.0–34.0)
MCHC: 29.7 g/dL — ABNORMAL LOW (ref 30.0–36.0)
MCHC: 30.4 g/dL (ref 30.0–36.0)
MCV: 81.1 fL (ref 78.0–100.0)
MCV: 80.3 fL (ref 78.0–100.0)
PLATELETS: 545 10*3/uL — AB (ref 150–400)
Platelets: 582 10*3/uL — ABNORMAL HIGH (ref 150–400)
RBC: 2.7 MIL/uL — ABNORMAL LOW (ref 4.22–5.81)
RBC: 3.2 MIL/uL — AB (ref 4.22–5.81)
RDW: 16.4 % — ABNORMAL HIGH (ref 11.5–15.5)
RDW: 15.9 % — ABNORMAL HIGH (ref 11.5–15.5)
WBC: 21.8 10*3/uL — ABNORMAL HIGH (ref 4.0–10.5)
WBC: 27.9 10*3/uL — AB (ref 4.0–10.5)

## 2015-12-26 LAB — CREATININE, SERUM
CREATININE: 2.3 mg/dL — AB (ref 0.61–1.24)
GFR, EST AFRICAN AMERICAN: 42 mL/min — AB (ref >60)
GFR, EST NON AFRICAN AMERICAN: 36 mL/min — AB (ref >60)

## 2015-12-26 LAB — VANCOMYCIN, RANDOM: Vancomycin Rm: 17 ug/mL

## 2015-12-26 MED ORDER — SODIUM CHLORIDE 0.9 % IV SOLN
Freq: Once | INTRAVENOUS | Status: AC
  Administered 2015-12-26: 15:00:00 via INTRAVENOUS

## 2015-12-26 MED ORDER — INSULIN ASPART 100 UNIT/ML ~~LOC~~ SOLN: 0.0000 [IU] | SUBCUTANEOUS | Status: DC

## 2015-12-26 MED ORDER — INSULIN ASPART 100 UNIT/ML ~~LOC~~ SOLN
0.0000 [IU] | SUBCUTANEOUS | Status: DC
  Administered 2015-12-26: 11 [IU] via SUBCUTANEOUS
  Administered 2015-12-26: 5 [IU] via SUBCUTANEOUS

## 2015-12-26 MED ORDER — SODIUM CHLORIDE 0.9 % IV SOLN
INTRAVENOUS | Status: DC
  Filled 2015-12-26: qty 2.5

## 2015-12-26 MED ORDER — METHYLPREDNISOLONE SODIUM SUCC 40 MG IJ SOLR
40.0000 mg | Freq: Two times a day (BID) | INTRAMUSCULAR | Status: DC
  Administered 2015-12-26 – 2015-12-27 (×4): 40 mg via INTRAVENOUS
  Filled 2015-12-26 (×3): qty 1

## 2015-12-26 MED ORDER — EPINEPHRINE HCL 0.1 MG/ML IJ SOSY: 0.1000 mg | PREFILLED_SYRINGE | INTRAMUSCULAR | Status: AC | PRN

## 2015-12-26 MED ORDER — INSULIN ASPART 100 UNIT/ML ~~LOC~~ SOLN: 2.0000 [IU] | SUBCUTANEOUS | Status: DC

## 2015-12-26 MED ORDER — DEXTROSE 10 % IV SOLN: INTRAVENOUS | Status: DC | PRN

## 2015-12-26 MED ORDER — INSULIN ASPART 100 UNIT/ML ~~LOC~~ SOLN
12.0000 [IU] | Freq: Once | SUBCUTANEOUS | Status: AC
  Administered 2015-12-26: 12 [IU] via SUBCUTANEOUS

## 2015-12-26 MED ORDER — FAMOTIDINE IN NACL 20-0.9 MG/50ML-% IV SOLN
20.0000 mg | Freq: Two times a day (BID) | INTRAVENOUS | Status: AC
Start: 2015-12-26 — End: 2016-01-03
  Administered 2015-12-26 – 2016-01-03 (×18): 20 mg via INTRAVENOUS
  Filled 2015-12-26 (×18): qty 50

## 2015-12-26 MED ORDER — MUPIROCIN CALCIUM 2 % EX CREA
TOPICAL_CREAM | Freq: Every day | CUTANEOUS | Status: DC
  Administered 2015-12-26 – 2015-12-30 (×5): via TOPICAL
  Administered 2015-12-31 – 2016-01-01 (×2): 1 via TOPICAL
  Administered 2016-01-02 – 2016-01-04 (×3): via TOPICAL
  Filled 2015-12-26: qty 15

## 2015-12-26 MED ORDER — VANCOMYCIN HCL IN DEXTROSE 750-5 MG/150ML-% IV SOLN
750.0000 mg | INTRAVENOUS | Status: DC
  Administered 2015-12-26 – 2015-12-30 (×3): 750 mg via INTRAVENOUS
  Filled 2015-12-26 (×3): qty 150

## 2015-12-26 MED ORDER — DEXTROSE 5 % IV SOLN
1.0000 g | Freq: Two times a day (BID) | INTRAVENOUS | Status: AC
  Administered 2015-12-26 – 2016-01-03 (×18): 1 g via INTRAVENOUS
  Filled 2015-12-26 (×18): qty 1

## 2015-12-26 MED ORDER — MIDODRINE HCL 5 MG PO TABS
5.0000 mg | ORAL_TABLET | Freq: Three times a day (TID) | ORAL | Status: DC
  Administered 2015-12-26 – 2015-12-28 (×7): 5 mg via ORAL
  Filled 2015-12-26 (×7): qty 1

## 2015-12-26 MED ORDER — FAMOTIDINE IN NACL 20-0.9 MG/50ML-% IV SOLN
20.0000 mg | Freq: Once | INTRAVENOUS | Status: DC
  Filled 2015-12-26: qty 50

## 2015-12-26 MED ORDER — INSULIN ASPART 100 UNIT/ML ~~LOC~~ SOLN
0.0000 [IU] | SUBCUTANEOUS | Status: DC
  Administered 2015-12-27: 5 [IU] via SUBCUTANEOUS
  Administered 2015-12-27: 11 [IU] via SUBCUTANEOUS

## 2015-12-26 MED ORDER — METHYLPREDNISOLONE SODIUM SUCC 40 MG IJ SOLR
40.0000 mg | Freq: Once | INTRAMUSCULAR | Status: DC
  Filled 2015-12-26: qty 1

## 2015-12-26 MED ORDER — INSULIN ASPART 100 UNIT/ML ~~LOC~~ SOLN
0.0000 [IU] | SUBCUTANEOUS | Status: DC
  Administered 2015-12-26: 15 [IU] via SUBCUTANEOUS

## 2015-12-26 NOTE — Consult Note (Addendum)
WOC wound follow up Wound type: 3 Vac dressings all leaking and cannot maintain seal r/t large amt yellow mucous/drainage from around [place] insertion site.  Removed Vac dressings and applied moist fluffed gauze.  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  Previous Vac cannister with 375cc drainage.  Pt has declined in condition and it is difficult for him to tolerate turning for an extended period of time; there are multiple systemic factors which can impair wound healing.  Will reassess on Wed and if drainage from around [facility] has decreased, will re-apply Vac dressings at that time.  Mother at bedside to look at wound appearance and discuss plan of care; she denies further questions at this time.  Mother states right great toe had ingrown nail and small amt pus draining over the weekend.  She cleaned it and applied Bactroban and gauze.  Site is currently red without open wound or further drainage.  Agree with plan of care. Ostomy pouch intact with good seal, large amt thick liquid tan stool.  Emptied 300cc.  Extra supplies at bedside for staff nurse use. Julien Girt MSN, RN, [place], [place], [place]

## 2015-12-26 NOTE — Progress Notes (Signed)
[facility]ious [facility]   Reason for visit: Follow up on VAP  Interval History: febrile, seizure overnight with fever, decreased mental status again.  Stable secretions.   CXR independently reviewed and perhaps a slight bit of improvement  Physical Exam: Constitutional:  Filed Vitals:   12/26/15 0400 12/26/15 0600  BP: 130/84 130/86  Pulse: 85 85  Temp:  99.7 F (37.6 C)  Resp: 14 14   patient appears in NAD, minimally responsive but does open eyes Eyes: anicteric HENT: + trach Respiratory: on vent Cardiovascular: Tachy RR GI: soft, nt, nd Skin: no rashes  Review of Systems: Unable to be assessed due to mental status  Lab Results  Component Value Date   WBC 29.6* 12/23/2015   HGB 7.4* 12/23/2015   HCT 26.4* 12/23/2015   MCV 84.1 12/23/2015   PLT 749* 12/23/2015    Lab Results  Component Value Date   CREATININE 1.99* 12/24/2015   BUN 42* 12/24/2015   NA 140 12/24/2015   K 4.5 12/24/2015   CL 105 12/24/2015   CO2 25 12/24/2015    Lab Results  Component Value Date   ALT 11* 12/23/2015   AST 15 12/23/2015   ALKPHOS 104 12/23/2015     Microbiology: Recent Results (from the past 240 hour(s))  Culture, blood (Routine X 2) w Reflex to ID Panel     Status: None   Collection Time: 12/18/15  5:05 PM  Result Value Ref Range Status   Specimen Description LEFT ANTECUBITAL  Final   Special Requests BOTTLES DRAWN AEROBIC ONLY 8CC  Final   Culture  Setup Time   Final    GRAM POSITIVE COCCI IN CLUSTERS AEROBIC BOTTLE ONLY CRITICAL RESULT CALLED TO, READ BACK BY AND VERIFIED WITH: STEPHANIE VIVERITO,KRN AT 0827 12/20/15 BY K BARR    Culture   Final    STAPHYLOCOCCUS SPECIES (COAGULASE NEGATIVE) THE SIGNIFICANCE OF ISOLATING THIS ORGANISM FROM A SINGLE SET OF BLOOD CULTURES WHEN MULTIPLE SETS ARE DRAWN IS UNCERTAIN. PLEASE NOTIFY THE MICROBIOLOGY DEPARTMENT WITHIN ONE WEEK IF SPECIATION AND SENSITIVITIES ARE REQUIRED.    Report Status 12/23/2015 FINAL   Final  C difficile quick scan w PCR reflex     Status: None   Collection Time: 12/19/15  2:00 PM  Result Value Ref Range Status   C Diff antigen NEGATIVE NEGATIVE Final   C Diff toxin NEGATIVE NEGATIVE Final   C Diff interpretation Negative for toxigenic C. difficile  Final  Culture, blood (routine x 2)     Status: None   Collection Time: 12/20/15  3:20 PM  Result Value Ref Range Status   Specimen Description BLOOD LEFT ANTECUBITAL  Final   Special Requests BOTTLES DRAWN AEROBIC AND ANAEROBIC 5CC  Final   Culture NO GROWTH 5 DAYS  Final   Report Status 12/25/2015 FINAL  Final  Culture, blood (routine x 2)     Status: None   Collection Time: 12/20/15  3:29 PM  Result Value Ref Range Status   Specimen Description BLOOD LEFT HAND  Final   Special Requests IN PEDIATRIC BOTTLE 3CC  Final   Culture NO GROWTH 5 DAYS  Final   Report Status 12/25/2015 FINAL  Final  Culture, respiratory (NON-Expectorated)     Status: None (Preliminary result)   Collection Time: 12/24/15  1:45 PM  Result Value Ref Range Status   Specimen Description TRACHEAL ASPIRATE  Final   Special Requests Normal  Final   Gram Stain   Final  ABUNDANT WBC PRESENT, PREDOMINANTLY PMN FEW SQUAMOUS EPITHELIAL CELLS PRESENT NO ORGANISMS SEEN THIS SPECIMEN IS ACCEPTABLE FOR SPUTUM CULTURE Performed at [facility]    Culture PENDING  Incomplete   Report Status PENDING  Incomplete    Impression/Plan:  1. VAP - MDR Pseudomonas.  Has severe penicillin allergy and potential cephalosporin allergy but with limited options, will treat with cefepime.  Mother aware of potential anaphylaxis due to antibiotic.  Mother aware that options are limited and that the treatment, if truly allergic, could cause harm.  She understands the risks and agrees to proceed.   -will premedicate with H2 and steroids.    2. Chronic respiratory failure - multiple infections in the past.    3. Seizure - this was new.  Received ativan.

## 2015-12-26 NOTE — Progress Notes (Signed)
Attempted wean.  Pt failed.  RR 42.

## 2015-12-26 NOTE — Progress Notes (Signed)
With CBG of 425, got the critical hemoglobin result of 6.5 MD was paged.

## 2015-12-26 NOTE — Progress Notes (Signed)
Noted facial twitching ,PRN ativan given , 1 unit of PRBC ongoing with temp of 99.7.

## 2015-12-26 NOTE — Progress Notes (Signed)
PULMONARY / CRITICAL CARE MEDICINE   Name: Jason [name] MRN: [id number] DOB: 10-Sep-1986    ADMISSION DATE:  12/06/2015 CONSULTATION DATE:  12/06/15  REFERRING MD:  EDP  CHIEF COMPLAINT:  Decrease in O2 sats, fever, PNA  SUBJECTIVE: Multiple calls for CBG concerns, anxiety and benzos started.   VITAL SIGNS: BP 107/77 mmHg  Pulse 81  Temp(Src) 97.6 F (36.4 C) (Oral)  Resp 17  Ht 5\' 8"  (1.727 m)  Wt 57.153 kg (126 lb)  BMI 19.16 kg/m2  SpO2 100%  VENTILATOR SETTINGS: Vent Mode:  [-] PRVC FiO2 (%):  [30 %] 30 % Set Rate:  [14 bmp] 14 bmp Vt Set:  [550 mL] 550 mL PEEP:  [5 cmH20] 5 cmH20 Plateau Pressure:  [18 cmH20-20 cmH20] 20 cmH20  INTAKE / OUTPUT: I/O last 3 completed shifts: In: 8640.8 [I.V.:3900; DG:[id number]; IV Piggyback:100] Out: 4950 [Urine:3800; Stool:1150]  PHYSICAL EXAMINATION: General: Chronically ill --> interacts some Neuro: Opens eyes , gestures to his mother and communicates with her HEENT: PERRL, Clean trach site. Cardiovascular: RRR, no MRG Lungs: diffuse rhonchi.  Abdomen: PEG in place, + BS, ostomy Musculoskeletal: Muscle wasting bilaterally. Skin: No rashes. Sacral wound vac in place.   LABS:  BMET  Recent Labs Lab 12/22/15 0350 12/23/15 0530 12/24/15 0500 12/26/15 0907  NA 145 143 140  --   K 4.8 4.9 4.5  --   CL 109 108 105  --   CO2 25 24 25   --   BUN 41* 45* 42*  --   CREATININE 2.26* 2.26* 1.99* 2.30*  GLUCOSE 347* 433* 274*  --     Electrolytes  Recent Labs Lab 12/21/15 0527 12/22/15 0350 12/23/15 0530 12/24/15 0500  CALCIUM 11.7* 11.7* 12.1* 11.8*  MG 1.5*  --   --   --   PHOS 3.6  --   --   --     CBC  Recent Labs Lab 12/22/15 0350 12/23/15 0530 12/26/15 0907  WBC 33.9* 29.6* 21.8*  HGB 7.7* 7.4* 6.5*  HCT 27.6* 26.4* 21.9*  PLT 722* 749* 582*    Coag's No results for input(s): APTT, INR in the last 168 hours.  Sepsis Markers No results for input(s): LATICACIDVEN, PROCALCITON, O2SATVEN in  the last 168 hours.  ABG  Recent Labs Lab 12/22/15 1145  PHART 7.365  PCO2ART 43.1  PO2ART 86.9    Liver Enzymes  Recent Labs Lab 12/23/15 0530  AST 15  ALT 11*  ALKPHOS 104  BILITOT 0.3  ALBUMIN 2.1*    Cardiac Enzymes No results for input(s): TROPONINI, PROBNP in the last 168 hours.  Glucose  Recent Labs Lab 12/25/15 2043 12/25/15 2305 12/26/15 0352 12/26/15 0927 12/26/15 1036 12/26/15 1226  GLUCAP 211* 239* 198* 425* 473* 350*    Imaging No results found.   STUDIES:  CXR 02/28 > bibasilar airspace densities concerning for PNA. CT abdo/ pelvis>>>no fistula CXR 3/7 >>mildly increased bibasilar opacities are noted most consistent with subsegmental atelectasis and associated pleural effusions.  EEG 03/13 > mild to mod diffuse slowing with triphasic waves (could be seen with hypoxic/ischemic injury, toxic/metabolic encephalopathies (hepatic?), med effect). CXR 3/14 >> Consolidation medial right base with small right effusion, left lung clear  CULTURES: Blood 02/28 >>No growth  Urine 02/28 >>Multiple species, suggest recollection Blood 3/1>> No growth  Sputum 3/6 >>> few pseudomonas >>>MDR Blood Culture 3/6 >> neg Urine Culture 3/7 >>Yeast  MRSA pos screen Blood 03/12 > GPC's > coag neg staph CDiff 3/13 >> Negative  Blood 3/14 >>  ANTIBIOTICS: Vanc 02/28 (7/7) >> 3/7 Levaquin 02/28 (7/7) >>3/7 Tigecycline 3/7 >> 3/9 Aztreonam 3/7 >> 3/9 Colistin 3/9 >> 3/13 Fluconazole 03/9 > Vanc 03/14 > Inhaled amikacin 3/16>>> Cefepime 3/19>>>  SIGNIFICANT EVENTS: 2/28  Admitted with probable HCAP 3/2- no fevers, low secretions, remains vented, refusing Chest PT and other treatments 3/3 improved to TC, pcxr better 3/5- trach collar 24  Hrs 3/6- Tmax 103.2 3/8 still spiking temps  3/9 Colistin started.  3/13: encephalopathic, rising creatinine, hyperkalemic.  3/14 > remains encephalopathic.  1/2 blood cx's with GPC's.  3/16 : unresponsive. Resp  pattern worse, agonal. Adding inhaled amikacin, placed back on vent. Began talks w/ mother and father about EOL. They understand he may not survive. Mom not ready to make DNR, but says "she is prepared and won't let trent suffer".   LINES/TUBES: Chronic trach / PEG >> Suprapubic catheter changed 3/8  I reviewed CXR myself, trach in good position, infiltrate noted.  ASSESSMENT / PLAN:  PULMONARY A: Chronic respiratory failure with trach dependence - Now acute on Chronic respiratory failure on 3/16 w/ agonal resp pattern.  Tracheostomy status MDR pseudeomonas PNA -->concerned that his clinical decline reflects the result of [name] stopping treatment for the pseudomonas. We had hoped that this was a colonizer, but his rising WBC, resp distress & worsening CXR and overall clinical decline would reflect otherwise.  P:  PS as able, progress to TC as able. See ID section. CXR intermittently.  CARDIOVASCULAR A:  Chronic hypotension - on midodrine P:  Restart midodrine. Monitor hemodynamics.  RENAL Lab Results  Component Value Date   CREATININE 2.30* 12/26/2015   CREATININE 1.99* 12/24/2015   CREATININE 2.26* 12/23/2015   CREATININE 0.78 11/02/2015    Recent Labs Lab 12/22/15 0350 12/23/15 0530 12/24/15 0500  K 4.8 4.9 4.5     A:   New AKI and hyperkalemia --> was hypotensive but also on Colistin, slowly improving Metabolic acidosis resolved P:   Cont NS @ 90ml/hr Cont free water to 289ml every 6 hours Renal dose meds.  BMP in AM.  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 TF's at goal.  HEMATOLOGIC  Recent Labs  12/26/15 0907  HGB 6.5*   A:   Anemia - chronic, some dilution now Thrombocytosis - chronic VTE Prophylaxis P:  Transfuse for Hgb < 7. SCD's / heparin.  INFECTIOUS A:   Bacteremia - 1/2 blood cultures from 03/12 with GPC's.  Also with increasing leukocytosis as of 03/14 UTI -  hx pseudomonal UTI in past with resistance to multiple abx - positive for yeast  HCAP-->pseudomonas MDR given clinical decline feel that this is more than a colonizer.  Hx sacral wounds P:   ID following - appreciate the assistance. Abx per ID. WOC following.  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  Have recommended changes to novolog to include tube fee coverage > added 03/14. Repeated episodes hypoglycemia-->better w/ reduced lantus, now 3/17 hyperglycemic Adrenal insuff - cortisol 12.7 but no need for additional steroids for now. P:   Continue SSI - tube coverage added 03/14. Lantus reduced to 5 units  started 3/13 because of repeated lows. Continue sensitive scale. Increased lantus 3/17 8 units 3/18 increased to 10 units  NEUROLOGIC A:   Acute encephalopathy - EEG 03/13 with mild to mod diffuse slowing with triphasic waves (could be seen with hypoxic/ischemic injury, toxic/metabolic encephalopathies (hepatic?), med effect). Ammonia, TSH negative Hx seizures (not on meds), paraplegia, fibromyalgia, diabetic neuropathy P:   D/c Lyrica and Baclofen ns Would avoid benadryl (received 03/13 and was very somnolent after).  Family updated: Mother updated bedside.  Discussed with bedside RN.  Rush Farmer, M.D. [facility] Pulmonary/Critical Care Medicine. Pager: [phone]. After hours pager: [phone].

## 2015-12-26 NOTE — Progress Notes (Addendum)
Pharmacy Antibiotic Note  69 YOM with MDR pseudomonas PNA and CoNS bacteremia.  Pharmacy has been consulted for inhaled amikacin and vancomycin dosing, now to add cefepime.  Patient's allergic reaction to cefuroxime is anaphylaxis; however, cefuroxime and cefepime are not structurally similar.  Mother is aware of risk per MD, and Pharmacy will premedicate prior to administering cefepime.  His renal function is worsening, yet his vancomycin random level is within desired range at 17 mcg/mL.     Plan: - Continue Amikacin 500mg  IH BID - Vanc 750mg  IV Q48H - Monitor renal fxn, clinical progress - Cefepime 1gm IV Q12H.  Premedicate with Solu-Medrol 40mg  IV and Pepcid 20mg  IV 30 min prior to med administration.  PRN albuterol nebs Q4H PRN as already ordered.  Epinephrine IV PRN for anaphylaxis. - Watch for anaphylaxis reaction.  If patient tolerates, may consider discontinuing premeds - Watch CBGs as Solu-Medrol will worsen hyperglycemia    Height: 5\' 8"  (172.7 cm) Weight: 126 lb (57.153 kg) IBW/kg (Calculated) : 68.4  Temp (24hrs), Avg:101.7 F (38.7 C), Min:97.8 F (36.6 C), Max:104.6 F (40.3 C)   Recent Labs Lab 12/20/15 0418 12/20/15 1531 12/21/15 0527 12/22/15 0350 12/23/15 0530 12/24/15 0500 12/25/15 0500  WBC 22.9*  --  27.3* 33.9* 29.6*  --   --   CREATININE 1.97* 2.13* 2.27* 2.26* 2.26* 1.99*  --   VANCORANDOM  --   --   --   --  35  --  15    Estimated Creatinine Clearance: 43.5 mL/min (by C-G formula based on Cr of 1.99).    Allergies  Allergen Reactions  . Cefuroxime Axetil Anaphylaxis  . Ertapenem Other (See Comments)    Rash and confusion-->tolerated Imipenem   . Morphine And Related Other (See Comments)    Changed mental status, confusion, headache, visual hallucination  . Penicillins Anaphylaxis and Other (See Comments)    Tolerated Imipenem; no reaction to 7 day course of amoxicillin in 2015 Has patient had a PCN reaction causing immediate rash,  facial/tongue/throat swelling, SOB or lightheadedness with hypotension: Yes Has patient had a PCN reaction causing severe rash involving mucus membranes or skin necrosis: No Has patient had a PCN reaction that required hospitalization Yes Has patient had a PCN reaction occurring within the last 10 years: No If all of the above answers are "NO", then may proceed with Cephalo  . Sulfa Antibiotics Anaphylaxis, Shortness Of Breath and Other (See Comments)  . Tessalon [Benzonatate] Anaphylaxis  . Shellfish Allergy Itching and Other (See Comments)    Took benadryl to alleviate reaction  . Levaquin [Levofloxacin In D5w] Swelling    Hand and forearm swelling  . Nsaids Itching and Other (See Comments)    Risk of bleeding, itching  . Miripirium Rash and Other (See Comments)    Change in mental status    Antimicrobials this admission: Vanc 3/1 >> 3/7, 3/14 >> Levaquin 3/1 >> 3/7 Primaxin x 1 2/28 Aztreonam 3/7 >> 3/9 Tigecycline 3/7 >> 3/9 Colistin 3/9 >> 3/13 Fluc 3/9 >> Amikacin 3/16 >> Cefepime 3/20 >>  Dose adjustments this admission: 3/2 VT = 19 mcg/mL 3/17 0530 VR = 35 on 500mg  q24 (obtained prior to 3rd dose) 3/19 0500 VR = 15, gave vanc 500mg  x1 3/20 0900 VR = 17 mcg/mL >> vanc 750mg  q48 (SCr 2.3)  Microbiology results: 2/28 UCx >> mult species FINAL 2/28 BCx x2>> ngF 3/1 BCx x2>> ngF 3/1 MRSA PCR >> positive 3/6 TA - Pseudomonas 3/6  blood x 2:  NF 3/7  Urine:  > 100K yeast 3/13 C.diff: neg 3/12 blood cx: CoNS - pending 3/14 blood cx: NGTD 3/19 BCx x2 -    Tanayah Squitieri D. Mina Marble, PharmD, BCPS Pager:  [phone] 12/26/2015, 10:09 AM

## 2015-12-27 DIAGNOSIS — N390 Urinary tract infection, site not specified: Secondary | ICD-10-CM | POA: Diagnosis present

## 2015-12-27 DIAGNOSIS — M797 Fibromyalgia: Secondary | ICD-10-CM | POA: Diagnosis present

## 2015-12-27 DIAGNOSIS — J95851 Ventilator associated pneumonia: Secondary | ICD-10-CM | POA: Diagnosis present

## 2015-12-27 DIAGNOSIS — I959 Hypotension, unspecified: Secondary | ICD-10-CM | POA: Diagnosis present

## 2015-12-27 DIAGNOSIS — A419 Sepsis, unspecified organism: Principal | ICD-10-CM

## 2015-12-27 DIAGNOSIS — J9691 Respiratory failure, unspecified with hypoxia: Secondary | ICD-10-CM | POA: Diagnosis present

## 2015-12-27 DIAGNOSIS — F411 Generalized anxiety disorder: Secondary | ICD-10-CM | POA: Diagnosis present

## 2015-12-27 DIAGNOSIS — N179 Acute kidney failure, unspecified: Secondary | ICD-10-CM | POA: Diagnosis present

## 2015-12-27 DIAGNOSIS — D72829 Elevated white blood cell count, unspecified: Secondary | ICD-10-CM

## 2015-12-27 DIAGNOSIS — R569 Unspecified convulsions: Secondary | ICD-10-CM | POA: Diagnosis present

## 2015-12-27 DIAGNOSIS — Z515 Encounter for palliative care: Secondary | ICD-10-CM | POA: Diagnosis present

## 2015-12-27 DIAGNOSIS — J151 Pneumonia due to Pseudomonas: Secondary | ICD-10-CM | POA: Diagnosis present

## 2015-12-27 DIAGNOSIS — I9589 Other hypotension: Secondary | ICD-10-CM | POA: Diagnosis present

## 2015-12-27 DIAGNOSIS — E108 Type 1 diabetes mellitus with unspecified complications: Secondary | ICD-10-CM | POA: Diagnosis present

## 2015-12-27 LAB — BASIC METABOLIC PANEL
Anion gap: 10 (ref 5–15)
BUN: 54 mg/dL — AB (ref 6–20)
CHLORIDE: 105 mmol/L (ref 101–111)
CO2: 21 mmol/L — ABNORMAL LOW (ref 22–32)
CREATININE: 2.11 mg/dL — AB (ref 0.61–1.24)
Calcium: 10.4 mg/dL — ABNORMAL HIGH (ref 8.9–10.3)
GFR calc Af Amer: 46 mL/min — ABNORMAL LOW (ref >60)
GFR, EST NON AFRICAN AMERICAN: 40 mL/min — AB (ref >60)
GLUCOSE: 331 mg/dL — AB (ref 65–99)
POTASSIUM: 4 mmol/L (ref 3.5–5.1)
Sodium: 136 mmol/L (ref 135–145)

## 2015-12-27 LAB — CBC
HCT: 26.3 % — ABNORMAL LOW (ref 39.0–52.0)
Hemoglobin: 7.9 g/dL — ABNORMAL LOW (ref 13.0–17.0)
MCH: 24.2 pg — AB (ref 26.0–34.0)
MCHC: 30 g/dL (ref 30.0–36.0)
MCV: 80.7 fL (ref 78.0–100.0)
PLATELETS: 575 10*3/uL — AB (ref 150–400)
RBC: 3.26 MIL/uL — AB (ref 4.22–5.81)
RDW: 16.2 % — ABNORMAL HIGH (ref 11.5–15.5)
WBC: 32 10*3/uL — ABNORMAL HIGH (ref 4.0–10.5)

## 2015-12-27 LAB — TYPE AND SCREEN
ABO/RH(D): O POS
ANTIBODY SCREEN: NEGATIVE
UNIT DIVISION: 0

## 2015-12-27 LAB — GLUCOSE, CAPILLARY
GLUCOSE-CAPILLARY: 422 mg/dL — AB (ref 65–99)
GLUCOSE-CAPILLARY: 390 mg/dL — AB (ref 65–99)
GLUCOSE-CAPILLARY: 259 mg/dL — AB (ref 65–99)
Glucose-Capillary: 226 mg/dL — ABNORMAL HIGH (ref 65–99)
Glucose-Capillary: 324 mg/dL — ABNORMAL HIGH (ref 65–99)
Glucose-Capillary: 411 mg/dL — ABNORMAL HIGH (ref 65–99)
Glucose-Capillary: 320 mg/dL — ABNORMAL HIGH (ref 65–99)

## 2015-12-27 LAB — PHOSPHORUS: Phosphorus: 3.1 mg/dL (ref 2.5–4.6)

## 2015-12-27 LAB — MAGNESIUM: Magnesium: 1.7 mg/dL (ref 1.7–2.4)

## 2015-12-27 MED ORDER — LORAZEPAM 0.5 MG PO TABS
0.5000 mg | ORAL_TABLET | Freq: Three times a day (TID) | ORAL | Status: DC
  Administered 2015-12-27 – 2015-12-28 (×3): 0.5 mg via ORAL
  Filled 2015-12-27 (×3): qty 1

## 2015-12-27 MED ORDER — INSULIN ASPART 100 UNIT/ML ~~LOC~~ SOLN
0.0000 [IU] | SUBCUTANEOUS | Status: DC
  Administered 2015-12-27 (×2): 20 [IU] via SUBCUTANEOUS
  Administered 2015-12-27: 15 [IU] via SUBCUTANEOUS
  Administered 2015-12-27: 20 [IU] via SUBCUTANEOUS
  Administered 2015-12-28: 15 [IU] via SUBCUTANEOUS
  Administered 2015-12-28: 11 [IU] via SUBCUTANEOUS
  Administered 2015-12-28 – 2015-12-29 (×4): 20 [IU] via SUBCUTANEOUS
  Administered 2015-12-29 (×2): 7 [IU] via SUBCUTANEOUS
  Administered 2015-12-29: 11 [IU] via SUBCUTANEOUS
  Administered 2015-12-29: 4 [IU] via SUBCUTANEOUS
  Administered 2015-12-29: 11 [IU] via SUBCUTANEOUS
  Administered 2015-12-30: 4 [IU] via SUBCUTANEOUS
  Administered 2015-12-30: 11 [IU] via SUBCUTANEOUS
  Administered 2015-12-30: 7 [IU] via SUBCUTANEOUS
  Administered 2015-12-30: 20 [IU] via SUBCUTANEOUS
  Administered 2015-12-30: 7 [IU] via SUBCUTANEOUS
  Administered 2015-12-31: 3 [IU] via SUBCUTANEOUS
  Administered 2015-12-31: 4 [IU] via SUBCUTANEOUS
  Administered 2015-12-31: 11 [IU] via SUBCUTANEOUS
  Administered 2015-12-31: 15 [IU] via SUBCUTANEOUS
  Administered 2015-12-31: 4 [IU] via SUBCUTANEOUS
  Administered 2016-01-01: 7 [IU] via SUBCUTANEOUS
  Administered 2016-01-01: 11 [IU] via SUBCUTANEOUS
  Administered 2016-01-01: 20 [IU] via SUBCUTANEOUS
  Administered 2016-01-01: 4 [IU] via SUBCUTANEOUS
  Administered 2016-01-01 – 2016-01-02 (×2): 20 [IU] via SUBCUTANEOUS
  Administered 2016-01-02: 11 [IU] via SUBCUTANEOUS
  Administered 2016-01-02 (×2): 15 [IU] via SUBCUTANEOUS
  Administered 2016-01-03: 11 [IU] via SUBCUTANEOUS
  Administered 2016-01-03: 4 [IU] via SUBCUTANEOUS
  Administered 2016-01-03: 7 [IU] via SUBCUTANEOUS
  Administered 2016-01-03 (×2): 4 [IU] via SUBCUTANEOUS
  Administered 2016-01-04: 3 [IU] via SUBCUTANEOUS
  Administered 2016-01-04: 4 [IU] via SUBCUTANEOUS
  Administered 2016-01-04: 3 [IU] via SUBCUTANEOUS
  Administered 2016-01-04: 15 [IU] via SUBCUTANEOUS
  Administered 2016-01-05 (×3): 3 [IU] via SUBCUTANEOUS

## 2015-12-27 MED ORDER — TRAZODONE HCL 50 MG PO TABS: 50.0000 mg | ORAL_TABLET | Freq: Every evening | ORAL | Status: DC | PRN

## 2015-12-27 MED ORDER — LORAZEPAM 0.5 MG PO TABS
0.5000 mg | ORAL_TABLET | Freq: Three times a day (TID) | ORAL | Status: DC | PRN
  Administered 2015-12-27: 0.5 mg via ORAL
  Filled 2015-12-27: qty 1

## 2015-12-27 MED ORDER — ACETAMINOPHEN 160 MG/5ML PO SOLN
500.0000 mg | Freq: Three times a day (TID) | ORAL | Status: DC
  Administered 2015-12-27 – 2015-12-28 (×4): 500 mg via ORAL
  Filled 2015-12-27 (×4): qty 20.3

## 2015-12-27 NOTE — Consult Note (Signed)
Consultation Note Date: 12/27/2015   Patient Name: Jason [name]  DOB: 1985/11/17  MRN: [id number]  Age / Sex: 30 y.o., male  PCP: [name], MD Referring Physician: Allie Bossier, MD  Reason for Consultation: Establishing goals of care  [name] is a 30 yo gentleman known to me from previous hospitalizations. 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 Also with h/o recurrent infections and acute on chronic respiratory failure but has not required ventilator support roughly since Nov 2016 (has not been hospitalized since this time as well). He is acutely ill now with high fevers, seizures, and acute infection including bacteremia, yeast in urine, and likely active MDR pseudomonas HCAP. WBC continues to increase and he still requires full ventilator support.   Clinical Assessment/Narrative: I met today with [name]'s mother, [name], at bedside. Jason [name] is in and out of lethargy and not participating in conversation and he is unable to communicate effectively while on vent. Discussed overall condition and concerns with mother.   GOALS: Mother is not prepared for DNR and wishes to remain full code. She does not feel that he has reached EOL. I believe she is associating this largely on his vent settings as she alludes to the recent passing of her sister and what her vent settings were in comparison. She does not feel like all avenues of recovery have been exhausted and is desiring full aggressive care up until ALL has been done that can be done. I did express the concern of [name]'s providers that we may be exhausted many options for recovery and she knows that this will likely happen (if not now then in the future) but is hoping this is not the case for now. Jason [name] has also expressed to me consistently in the past his desire for full aggressive care - she confirms that these are  the wishes he has expressed to her and she will advocate for his wishes. We did discuss his frustration for the vent (and for NPO on last admission). Mother agrees that she will have difficult decisions if he is unable to come off the vent and is unable to eat as this may change their goals.   She also does not wish for [name] to suffer either. Desires treatment of his anxiety and pain as she feels this is his main barrier to vent weaning. Discussed scheduled Ativan and Tylenol with Dr. Sherral Hammers and we will begin weaning again tomorrow morning with these meds onboard - [name] is happy with this plan. She also tells me how his fiance left him during his last admission and he has been hurt and depressed since then - cont depression meds.   She feels like Jason [name] is frustrated with being on the vent because he doesn't understand why this is needed. She would like to try trach collar even if this is for very briefly "to give him a chance" and also to give him some framework of why he requires continued vent support.   I will continue to follow and support Jason [name] and his mother. She expresses understanding that this could lead to his death but does not want to accept this at this time and remains hopeful for improvement.   Contacts/Participants in Discussion: Primary Decision Maker: Vic Ripper   Relationship to Patient mother   Code Status/Advance Care Planning: Full code    Code Status Orders        Start  Ordered   12/06/15 2301  Full code   Continuous     12/06/15 2300    Code Status History    Date Active Date Inactive Code Status Order ID Comments User Context   08/25/2015  2:07 PM 09/02/2015  4:59 PM Full Code [id number]  Samella Parr, NP Inpatient   07/11/2015  1:07 AM 08/16/2015  7:53 PM Full Code [id number]  [name], MD Inpatient   04/25/2015  7:11 PM 06/01/2015  7:50 PM Full Code [id number]  Oswald Hillock, MD Inpatient   02/18/2015  8:26 PM 03/06/2015  8:26 PM Full Code  [id number]  Jani Gravel, MD Inpatient   12/02/2014  1:19 AM 12/24/2014  9:05 PM Full Code [id number]  Ivor Costa, MD Inpatient   11/08/2014  6:33 PM 11/19/2014  9:24 PM Full Code [id number]  Thurnell Lose, MD Inpatient   09/29/2014  6:48 PM 10/15/2014 12:12 AM Full Code [id number]  Velvet Bathe, MD Inpatient   09/16/2014  5:58 AM 09/24/2014  4:59 PM Full Code [id number]  Corey Harold, NP ED   08/13/2014  1:48 AM 08/26/2014  9:08 PM Full Code [id number]  Merton Border, MD Inpatient   07/30/2014  2:54 AM 07/30/2014  3:09 PM Full Code [id number]  Ivor Costa, MD Inpatient   07/18/2014  8:21 PM 07/20/2014  8:03 PM Full Code [id number]  Ivor Costa, MD Inpatient   05/28/2014  6:22 AM 05/28/2014  4:47 PM Full Code [id number]  [name], MD Inpatient   03/16/2014  4:49 PM 03/21/2014  6:08 PM Full Code [id number]  Rigoberto Noel, MD Inpatient   02/05/2014  6:48 PM 03/05/2014  8:05 PM Full Code [id number]  Bary Leriche, PA-C Inpatient   01/29/2014  8:22 PM 02/05/2014  6:48 PM Full Code [id number]  Berle Mull, MD Inpatient   12/24/2013  7:34 PM 12/25/2013  2:16 PM Full Code [id number]  Etta Quill, DO Inpatient   09/23/2013  6:50 PM 10/05/2013  3:58 PM Full Code [id number]  Domenic Polite, MD Inpatient   06/30/2013  2:19 PM 07/02/2013 12:45 PM Full Code [id number]  Sheila Oats, MD Inpatient   04/18/2013 11:45 PM 04/19/2013  4:18 PM Full Code [id number]  [name] Bellows, MD Inpatient   04/11/2013  4:23 PM 04/14/2013  5:23 PM Full Code [id number]  Mendel Corning, MD Inpatient   03/20/2013  6:43 AM 03/20/2013  5:33 PM Full Code [id number]  Orvan Falconer, MD Inpatient   01/29/2013 12:19 AM 01/30/2013  4:40 PM Full Code [id number]  [name], MD Inpatient        Symptom Management:   Pain: Tylenol 500 mg TID. Baclofen and Lyrica currently on hold d/t encephalopathy.   Anxiety: Ativan 0.5 mg TID. Ativan 1-4 mg IV every 4 hours prn.   Depression: Continue Wellbutrin.   Continue with vent weaning and attempts for trach collar - trials to  begin again in the am. Hopefully this will be more successful with better pain/anxiety management.   Palliative Prophylaxis:   Aspiration, Delirium Protocol, Frequent Pain Assessment, Oral Care and Turn Reposition  Additional Recommendations (Limitations, Scope, Preferences):  Full Scope Treatment  Psycho-social/Spiritual:  Support System: Strong Desire for further Chaplaincy support:no Additional Recommendations: Caregiving  Support/Resources  Prognosis: Unable to determine  Discharge Planning: Plan is to return home.    Chief Complaint/ Primary Diagnoses: Present on Admission:  . Respiratory failure, chronic ([place]) . Pressure ulcer . Paraplegia ([place]) . Neurogenic bladder . Recurrent  UTI . HCAP (healthcare-associated pneumonia) . Acute on chronic respiratory failure ([place]) . Uncontrolled type 1 diabetes mellitus with hyperglycemia ([place])  I have reviewed the medical record, interviewed the patient and family, and examined the patient. The following aspects are pertinent.  Past Medical History  Diagnosis Date  . GERD (gastroesophageal reflux disease)   . Asthma   . Hx MRSA infection     on face  . Gastroparesis   . Diabetic neuropathy ([place])   . Seizures ([place])   . Family history of anesthesia complication     Pt mother can't have epidural procedures  . Dysrhythmia   . Pneumonia   . Arthritis   . Fibromyalgia   . Stroke ([place]) 01/29/2014    spinal stroke ; "quadriplegic since"  . Type I diabetes mellitus ([place])     sees Dr. Loanne Drilling   . Syncope 02/16/2015  . Multiple allergies 11/02/2015  . Recurrent UTI 11/02/2015   Social History   Social History  . Marital Status: Single    Spouse Name: N/A  . Number of Children: 0  . Years of Education: 11   Occupational History  . Disability    Social History Main Topics  . Smoking status: Former Smoker -- 1.00 packs/day for 12 years    Types: Cigars, Cigarettes    Quit date: 09/07/2014  . Smokeless tobacco: Never Used    . Alcohol Use: No  . Drug Use: No  . Sexual Activity: Not Currently   Other Topics Concern  . None   Social History Narrative   Patient lives at home with mother father.    Patient has 2 children.    Patient is single.    Patient was left hand but after stroke patient is now right handed.    Patient has  11th grade education.    Family History  Problem Relation Age of Onset  . Diabetes Father   . Hypertension Father   . Asthma      fhx  . Hypertension      fhx  . Stroke      fhx  . Heart disease Mother    Scheduled Meds: . antiseptic oral rinse  7 mL Mouth Rinse QID  . buPROPion  150 mg Oral Daily  . ceFEPime (MAXIPIME) IV  1 g Intravenous Q12H  . chlorhexidine gluconate  15 mL Mouth Rinse BID  . famotidine (PEPCID) IV  20 mg Intravenous Q12H  . fentaNYL  50 mcg Transdermal Q72H  . fluconazole  100 mg Oral Daily  . free water  200 mL Per Tube Q6H  . heparin  5,000 Units Subcutaneous 3 times per day  . insulin aspart  0-20 Units Subcutaneous 6 times per day  . insulin glargine  8 Units Subcutaneous Daily  . methylPREDNISolone (SOLU-MEDROL) injection  40 mg Intravenous Q12H  . midodrine  5 mg Oral TID AC  . mupirocin cream   Topical Daily  . naLOXone (NARCAN)  injection  0.4 mg Intramuscular Once  . pantoprazole sodium  40 mg Per Tube Daily  . sodium chloride flush  10-40 mL Intracatheter Q12H  . vancomycin  750 mg Intravenous Q48H   Continuous Infusions: . sodium chloride 75 mL/hr at 12/27/15 0812  . dextrose    . feeding supplement (GLUCERNA 1.2 CAL) 1,000 mL (12/26/15 0802)   PRN Meds:.sodium chloride, acetaminophen, albuterol, dextrose, EPINEPHrine, hydrALAZINE, LORazepam, LORazepam, sodium chloride flush Medications Prior to Admission:  Prior to Admission medications   Medication Sig Start  Date End Date Taking? Authorizing Provider  acetaminophen (TYLENOL) 325 MG tablet Take 2 tablets (650 mg total) by mouth every 6 (six) hours as needed for mild pain,  moderate pain, fever or headache. 01/14/15  Yes Geradine Girt, DO  albuterol (PROVENTIL) (2.5 MG/3ML) 0.083% nebulizer solution Take 3 mLs (2.5 mg total) by nebulization every 4 (four) hours as needed for wheezing or shortness of breath. 04/27/14  Yes [name], MD  baclofen (LIORESAL) 20 MG tablet Take 1 tablet (20 mg total) by mouth 4 (four) times daily. 09/20/15  Yes Meredith Staggers, MD  BISACODYL 5 MG EC tablet Take ONE (1) tablet by mouth twice daily as needed FOR MODERATE CONSTIPATION 11/21/15  Yes [name], MD  buPROPion (WELLBUTRIN XL) 150 MG 24 hr tablet Take 1 tablet (150 mg total) by mouth daily. 11/29/15  Yes [name], MD  camphor-menthol [facility]) lotion Apply topically as needed for itching. 09/02/15  Yes Shanker Kristeen Mans, MD  collagenase Annitta Needs) ointment Apply 1 application topically every Monday, Wednesday, and Friday. Apply to necrotic tissue in the sacral wound only. 10/11/15  Yes [name], MD  diclofenac sodium (VOLTAREN) 1 % GEL Apply 2 g topically 2 (two) times daily. 09/27/15  Yes [name], MD  docusate (COLACE) 50 MG/5ML liquid Take 10 mLs (100 mg total) by mouth 2 (two) times daily. 11/10/15  Yes [name], MD  fentaNYL (DURAGESIC - DOSED MCG/HR) 50 MCG/HR Place 1 patch (50 mcg total) onto the skin every 3 (three) days. 11/25/15  Yes [name], MD  ferrous sulfate 325 (65 FE) MG tablet Take 1 tablet (325 mg total) by mouth daily with breakfast. 09/27/15  Yes [name], MD  glucagon (GLUCAGON EMERGENCY) 1 MG injection INJECT INTO MUSCLE ONCE AS NEEDED Patient taking differently: Inject 1 mg into the muscle once as needed (low blood sugar).  07/05/15  Yes Renato Shin, MD  GLUCERNA (GLUCERNA) LIQD 1,000 mLs by PEG Tube route continuous. 70 ml/hr   Yes Historical Provider, MD  insulin aspart (NOVOLOG FLEXPEN) 100 UNIT/ML FlexPen Inject 0-10 Units into the skin 4 (four) times daily as needed for high blood sugar (CBG >200). Per sliding scale: CBG <200 0  units, 201-300 4 units, 301-400 6 units, >400 10 units   Yes Historical Provider, MD  insulin glargine (LANTUS) 100 UNIT/ML injection Inject 0.15 mLs (15 Units total) into the skin at bedtime. 08/16/15  Yes Janece Canterbury, MD  loratadine (CLARITIN) 10 MG tablet Take 1 tablet (10 mg total) by mouth daily. 11/25/15  Yes [name], MD  LORazepam (ATIVAN) 0.5 MG tablet Take 0.5 mg by mouth 3 (three) times daily as needed for anxiety. Reported on 11/02/2015   Yes Historical Provider, MD  Magnesium Oxide 400 MG CAPS Take 1 capsule (400 mg total) by mouth 2 (two) times daily. 09/27/15  Yes [name], MD  midodrine (PROAMATINE) 10 MG tablet Take 2 tablets (20 mg total) by mouth 3 (three) times daily with meals. 10/28/15  Yes [name], MD  Multiple Vitamin (MULTIVITAMIN WITH MINERALS) TABS tablet Place 1 tablet into feeding tube daily. Patient taking differently: 1 tablet by PEG Tube route daily.  06/01/15  Yes Erick Colace, NP  ondansetron (ZOFRAN ODT) 8 MG disintegrating tablet Place 1 tablet (8 mg total) into feeding tube every 8 (eight) hours as needed for nausea or vomiting. Patient taking differently: 8 mg by PEG Tube route every 8 (  eight) hours as needed for nausea or vomiting.  06/01/15  Yes Erick Colace, NP  Oxycodone HCl 20 MG TABS Take 1 tablet (20 mg total) by mouth every 6 (six) hours as needed (pain). 11/02/15  Yes [name], MD  pantoprazole sodium (PROTONIX) 40 mg/20 mL PACK Place 20 mLs (40 mg total) into feeding tube daily. Patient taking differently: 40 mg by PEG Tube route daily.  06/01/15  Yes Erick Colace, NP  pregabalin (LYRICA) 100 MG capsule Place 1 capsule (100 mg total) into feeding tube 3 (three) times daily. Take 100 mg by mouth in the morning, take 100 mg by mouth in the afternoon, take 100 mg by mouth in the evening and take 200 mg by mouth at bedtime. 08/19/15  Yes [name], MD  saccharomyces boulardii (FLORASTOR) 250 MG capsule Take 1 capsule (250 mg  total) by mouth 2 (two) times daily. 08/16/15  Yes Janece Canterbury, MD  Water For Irrigation, Sterile (FREE WATER) SOLN Place 200 mLs into feeding tube every 8 (eight) hours. 10/11/15  Yes [name], MD  ciprofloxacin (CIPRO) 500 MG tablet Take 1 tablet (500 mg total) by mouth 2 (two) times daily. Patient not taking: Reported on 12/06/2015 11/07/15   Truman Hayward, MD  doxycycline (VIBRA-TABS) 100 MG tablet Take 1 tablet (100 mg total) by mouth 2 (two) times daily. Patient not taking: Reported on 12/06/2015 10/04/15   Truman Hayward, MD  fosfomycin [facility]) 3 G PACK Take 3 g by mouth as directed. Give patient a 3 gram dose today. Patient not taking: Reported on 12/06/2015 09/16/15   Truman Hayward, MD  glucose blood [facility]) test strip Use to check blood sugar 4 times per day. Dx Code: E11.9. Appointment needed for further refills. 12/13/15   Renato Shin, MD  mupirocin ointment Drue Stager) 2 % Apply to affected area twice a day as needed 12/14/15   [name], MD  SURE COMFORT INS SYR 1CC/29G 29G X 1/2" 1 ML MISC Use As Directed 11/04/15   [name], MD   Allergies  Allergen Reactions  . Cefuroxime Axetil Anaphylaxis  . Ertapenem Other (See Comments)    Rash and confusion-->tolerated Imipenem   . Morphine And Related Other (See Comments)    Changed mental status, confusion, headache, visual hallucination  . Penicillins Anaphylaxis and Other (See Comments)    Tolerated Imipenem; no reaction to 7 day course of amoxicillin in 2015 Has patient had a PCN reaction causing immediate rash, facial/tongue/throat swelling, SOB or lightheadedness with hypotension: Yes Has patient had a PCN reaction causing severe rash involving mucus membranes or skin necrosis: No Has patient had a PCN reaction that required hospitalization Yes Has patient had a PCN reaction occurring within the last 10 years: No If all of the above answers are "NO", then may proceed with Cephalo  . Sulfa  Antibiotics Anaphylaxis, Shortness Of Breath and Other (See Comments)  . Tessalon [Benzonatate] Anaphylaxis  . Shellfish Allergy Itching and Other (See Comments)    Took benadryl to alleviate reaction  . Levaquin [Levofloxacin In D5w] Swelling    Hand and forearm swelling  . Nsaids Itching and Other (See Comments)    Risk of bleeding, itching  . Miripirium Rash and Other (See Comments)    Change in mental status    Review of Systems  Unable to perform ROS   Physical Exam  Constitutional: He appears well-developed. He appears lethargic. He is  intubated.  HENT:  + Trach  Cardiovascular: Normal rate.   Respiratory: No accessory muscle usage. No tachypnea. He is intubated. No respiratory distress.  GI: Normal appearance.  Neurological: He appears lethargic.    Vital Signs: BP 126/87 mmHg  Pulse 101  Temp(Src) 97.4 F (36.3 C) (Axillary)  Resp 14  Ht _0  (1.727 m)  Wt 56.9 kg (125 lb 7.1 oz)  BMI 19.08 kg/m2  SpO2 100%  SpO2: SpO2: 100 % O2 Device:SpO2: 100 % O2 Flow Rate: .O2 Flow Rate (L/min): 30 L/min  IO: Intake/output summary:  Intake/Output Summary (Last 24 hours) at 12/27/15 1504 Last data filed at 12/27/15 1154  Gross per 24 hour  Intake   3385 ml  Output   3600 ml  Net   -215 ml    LBM: Last BM Date: 12/27/15 Baseline Weight: Weight:  (bed scale not working) Most recent weight: Weight: 56.9 kg (125 lb 7.1 oz)      Palliative Assessment/Data:    Additional Data Reviewed:  CBC:    Component Value Date/Time   WBC 32.0* 12/27/2015 0528   HGB 7.9* 12/27/2015 0528   HCT 26.3* 12/27/2015 0528   PLT 575* 12/27/2015 0528   MCV 80.7 12/27/2015 0528   NEUTROABS 11.3* 12/18/2015 2310   LYMPHSABS 4.5* 12/18/2015 2310   MONOABS 1.4* 12/18/2015 2310   EOSABS 0.5 12/18/2015 2310   BASOSABS 0.2* 12/18/2015 2310   Comprehensive Metabolic Panel:    Component Value Date/Time   NA 136 12/27/2015 0528   K 4.0 12/27/2015 0528   CL 105 12/27/2015 0528   CO2  21* 12/27/2015 0528   BUN 54* 12/27/2015 0528   CREATININE 2.11* 12/27/2015 0528   CREATININE 0.78 11/02/2015 1207   GLUCOSE 331* 12/27/2015 0528   CALCIUM 10.4* 12/27/2015 0528   AST 15 12/23/2015 0530   ALT 11* 12/23/2015 0530   ALKPHOS 104 12/23/2015 0530   BILITOT 0.3 12/23/2015 0530   PROT 8.1 12/23/2015 0530   ALBUMIN 2.1* 12/23/2015 0530     Time In: 1430 Time Out: 1600 Time Total: 49mn Greater than 50%  of this time was spent counseling and coordinating care related to the above assessment and plan.  Signed by: PPershing Proud NP  APershing Proud NP  32/59/5638 3:04 PM  Please contact Palliative Medicine Team phone at [phone] questions and concerns.

## 2015-12-27 NOTE — Progress Notes (Signed)
[place] for Infectious Disease   Reason for visit: Follow up on VAP  Interval History: Tmax 102, started on cefepime with premedication and no issues.  Holding amikacin inhaled, mother thought they were both sulfa-based.  Some mild improvement. WBC up to 32.    Physical Exam: Constitutional:  Filed Vitals:   12/27/15 0308 12/27/15 0400  BP: 147/92 144/86  Pulse: 96 98  Temp:  99.8 F (37.7 C)  Resp: 15 18   patient appears in NAD, minimally responsive but does open eyes Eyes: anicteric HENT: + trach Respiratory: on vent Cardiovascular: Tachy RR GI: soft, nt, nd Skin: no rashes  Review of Systems: Unable to be assessed due to mental status  Lab Results  Component Value Date   WBC 32.0* 12/27/2015   HGB 7.9* 12/27/2015   HCT 26.3* 12/27/2015   MCV 80.7 12/27/2015   PLT 575* 12/27/2015    Lab Results  Component Value Date   CREATININE 2.11* 12/27/2015   BUN 54* 12/27/2015   NA 136 12/27/2015   K 4.0 12/27/2015   CL 105 12/27/2015   CO2 21* 12/27/2015    Lab Results  Component Value Date   ALT 11* 12/23/2015   AST 15 12/23/2015   ALKPHOS 104 12/23/2015     Microbiology: Recent Results (from the past 240 hour(s))  Culture, blood (Routine X 2) w Reflex to ID Panel     Status: None   Collection Time: 12/18/15  5:05 PM  Result Value Ref Range Status   Specimen Description LEFT ANTECUBITAL  Final   Special Requests BOTTLES DRAWN AEROBIC ONLY 8CC  Final   Culture  Setup Time   Final    GRAM POSITIVE COCCI IN CLUSTERS AEROBIC BOTTLE ONLY CRITICAL RESULT CALLED TO, READ BACK BY AND VERIFIED WITH: STEPHANIE VIVERITO,KRN AT 0827 12/20/15 BY K BARR    Culture   Final    STAPHYLOCOCCUS SPECIES (COAGULASE NEGATIVE) THE SIGNIFICANCE OF ISOLATING THIS ORGANISM FROM A SINGLE SET OF BLOOD CULTURES WHEN MULTIPLE SETS ARE DRAWN IS UNCERTAIN. PLEASE NOTIFY THE MICROBIOLOGY DEPARTMENT WITHIN ONE WEEK IF SPECIATION AND SENSITIVITIES ARE REQUIRED.    Report Status  12/23/2015 FINAL  Final  C difficile quick scan w PCR reflex     Status: None   Collection Time: 12/19/15  2:00 PM  Result Value Ref Range Status   C Diff antigen NEGATIVE NEGATIVE Final   C Diff toxin NEGATIVE NEGATIVE Final   C Diff interpretation Negative for toxigenic C. difficile  Final  Culture, blood (routine x 2)     Status: None   Collection Time: 12/20/15  3:20 PM  Result Value Ref Range Status   Specimen Description BLOOD LEFT ANTECUBITAL  Final   Special Requests BOTTLES DRAWN AEROBIC AND ANAEROBIC 5CC  Final   Culture NO GROWTH 5 DAYS  Final   Report Status 12/25/2015 FINAL  Final  Culture, blood (routine x 2)     Status: None   Collection Time: 12/20/15  3:29 PM  Result Value Ref Range Status   Specimen Description BLOOD LEFT HAND  Final   Special Requests IN PEDIATRIC BOTTLE 3CC  Final   Culture NO GROWTH 5 DAYS  Final   Report Status 12/25/2015 FINAL  Final  Culture, respiratory (NON-Expectorated)     Status: None (Preliminary result)   Collection Time: 12/24/15  1:45 PM  Result Value Ref Range Status   Specimen Description TRACHEAL ASPIRATE  Final   Special Requests Normal  Final  Gram Stain   Final    ABUNDANT WBC PRESENT, PREDOMINANTLY PMN FEW SQUAMOUS EPITHELIAL CELLS PRESENT NO ORGANISMS SEEN THIS SPECIMEN IS ACCEPTABLE FOR SPUTUM CULTURE Performed at [facility]    Culture   Final    Culture reincubated for better growth Performed at [facility]    Report Status PENDING  Incomplete  Culture, blood (routine x 2)     Status: None (Preliminary result)   Collection Time: 12/25/15  6:46 PM  Result Value Ref Range Status   Specimen Description BLOOD LEFT ARM  Final   Special Requests IN PEDIATRIC BOTTLE 3CC  Final   Culture NO GROWTH < 24 HOURS  Final   Report Status PENDING  Incomplete  Culture, blood (routine x 2)     Status: None (Preliminary result)   Collection Time: 12/25/15  6:52 PM  Result Value Ref Range Status   Specimen  Description BLOOD LEFT ARM  Final   Special Requests IN PEDIATRIC BOTTLE 3CC  Final   Culture NO GROWTH < 24 HOURS  Final   Report Status PENDING  Incomplete    Impression/Plan:  1. VAP - MDR Pseudomonas.  On cefepime.  I will stop amikacin.  Will need to continue premedication, though I suspect allergy not true allergy, but hard to tease out.    2. Chronic respiratory failure - multiple infections in the past.  Trying to wean.    3. Leukocytosis - worsening status as well as steroids.   Discussed with mother at bedside.

## 2015-12-27 NOTE — Progress Notes (Signed)
eLink Physician-Brief Progress Note Patient Name: Jason [name] DOB: [birth year] MRN: [id number]   Date of Service  12/27/2015  HPI/Events of Note  Notified by bedside nurse that mother has concerns over inhaled  Amikacin. Mother reports patient had a "allergic reaction" with no evidence of swelling, hives, or rash. She reports his "allergic reactions" involve a blank stare and decreased level of consciousness. I reviewed the patient's notes from today and can find no evidence of documentation of this. It is evident that he had a seizure overnight that was thought to be secondary to elevated body temperature. Patient was started on cefepime today after lengthy discussions regarding potential for anaphylaxis given his prior allergies. Did a brief literature review which showed no history of seizures with inhaled amikacin. Mother requesting that we discontinue use of the amikacin at this time.Patient's last dose was At 2113 on 3/19.  eICU Interventions  1. DC amikacin at this time 2. Defer to infectious diseases on reinitiating in the morning     Intervention Category Major Interventions: Other:  Tera Partridge 12/27/2015, 12:30 AM

## 2015-12-27 NOTE — Progress Notes (Signed)
[facility] TEAM 1 - Stepdown/ICU TEAM Progress Note  Jason [name] [id number] DOB: 10-22-1985 DOA: 12/06/2015 PCP: [name], MD  Admit HPI / Brief Narrative: 30 y.o. 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 He is followed in trach clinic and he has been doing well and has not required any ventilator support for quite some time, last time roughly November 2016. He had been able to use PMV 24 hours a day without problem.  On 12/06/15, he was brought to [facility] ED due to drop in O2 saturations. This began 4 - 5 days prior and got to low of 88%. Mother began to put him on the vent for 2 - 4 hours at a time in hopes of improving O2 sats. She also noticed that his heart rate would intermittently climb into the low 100's. He has had a low grade fever, but mom reports this has been going on for over a month now. Denies any cough. He does have a hx of tracheitis and mom states a few months ago he had problems with excessive secretions. Over the past 4 - 5 days, secretions have not been to bad and mom has not been able to suction much from trach.  In ED, he had saturations in high 90's on his home vent settings. CXR revealed bibasilar densities concerning for PNA. PCCM was subsequently called for admission.   HPI/Subjective: 3/21 Tmax overnight 39C, seizure overnight per PCCM note. Currently patient is being agitated secondary to wanting to come off vent.  Assessment/Plan: VAP/ MDR pseudeomonas PNA - MDR Pseudomonas. Has severe penicillin allergy and potential cephalosporin allergy but with limited options, will treat with cefepime. Mother aware of limited options, and potential for anaphylaxis due to antibiotic. -will premedicate with H2 and steroids. -Antibiotics per ID  Acute on Chronic Respiratory Failure with hypoxia -On chronic trach  and PRN vent at home.  - multiple infections in the past. -Weaning trial daily   Seizure - Most likely multifactorial sepsis, VAP, Acute on Chronic Respiratory Failure. -No anti-seizure medication started. Ativan for acute seizures -3/20 Seizure overnight? Mother believed secondary to inhaled Amikacin. ID stopped antibiotic on 3/21  Anxiety -Start Ativan 0.5 mg TID (home dose)  Chronic hypotension  -Midodrine. 5 mg TID -Normal saline 75 ml/hr  Acute renal failure -Cr high of 123456 2.1  Metabolic acidosis  -Resolved   GERD, gastroparesis  Nutrition -TF's at goal.  Anemia - chronic, some dilution now  Thrombocytosis - chronic Transfuse for Hgb < 7. SCD's / heparin.  UTI - positive for yeast  -Continue fluconazole  Hx sacral wounds -WOC following.  DM Type 1 with complications ([place] ) - per DM coordinator note, pt and family refused lantus 03/13.  -Increase to resistant SSI -Lantus 8 units daily  Adrenal insuff  - cortisol 12.7 but no need for additional steroids for now.  Acute encephalopathy  -Resolved patient anxious to have ventilator removed.  Hx seizures (not on meds), paraplegia, fibromyalgia, diabetic neuropathy -D/c Lyrica and Baclofen ns -Would avoid benadryl (received 03/13 and was very somnolent after).  Goals of care -3/21 consulted Palliative Care; Patient with multisystem organ failure, currently bacteremic funguria multidrug resistant pneumonia; discussed change of CODE STATUS, short-term vs long-term goals of care    Code Status: FULL Family Communication: Mother present at time of exam Disposition Plan:     Consultants: [facility] Palliative  care   Procedure/Significant Events: CXR 02/28 > bibasilar airspace densities concerning for PNA. CT abdo/ pelvis>>>no fistula CXR 3/7 >>mildly increased bibasilar opacities are noted most consistent with subsegmental atelectasis and associated pleural effusions.  EEG 03/13 > mild to mod  diffuse slowing with triphasic waves (could be seen with hypoxic/ischemic injury, toxic/metabolic encephalopathies (hepatic?), med effect). CXR 3/14 >> Consolidation medial right base with small right effusion, left lung clear 3/2- no fevers, low secretions, remains vented, refusing Chest PT and other treatments 3/3 improved to TC, pcxr better 3/5- trach collar 24 Hrs 3/6- Tmax 103.2 3/8 still spiking temps  3/9 Colistin started.  3/13: encephalopathic, rising creatinine, hyperkalemic.  3/14 > remains encephalopathic. 1/2 blood cx's with GPC's.  3/16 : unresponsive. Resp pattern worse, agonal. Adding inhaled amikacin, placed back on vent. Began talks w/ mother and father about EOL. They understand he may not survive. Mom not ready to make DNR, but says "she is prepared and won't let trent suffer".    Culture Blood 02/28 >>No growth  Urine 02/28 >>Multiple species, suggest recollection Blood 3/1>> No growth  Sputum 3/6 >>> positive MDR pseudomonas  Blood Culture 3/6 >> neg Urine Culture 3/7 >> positive Yeast  MRSA pos screen Blood 03/12 > GPC's > coag neg staph CDiff 3/13 >> Negative  Blood 3/14 >>   Antibiotics: Vanc 02/28 (7/7) >> 3/7 Levaquin 02/28 (7/7) >>3/7 Tigecycline 3/7 >> 3/9 Aztreonam 3/7 >> 3/9 Colistin 3/9 >> 3/13 Fluconazole 03/9 > Vanc 03/14 > Inhaled Amikacin 3/16>>> 3/21 Cefepime 3/19>>>  DVT prophylaxis: Subcutaneous heparin   Devices    LINES / TUBES:     Continuous Infusions: . sodium chloride 75 mL/hr at 12/27/15 2057  . feeding supplement (GLUCERNA 1.2 CAL) 1,000 mL (12/27/15 1536)    Objective: VITAL SIGNS: Temp: 99.5 F (37.5 C) (03/21 2001) Temp Source: Oral (03/21 2001) BP: 143/84 mmHg (03/21 2001) Pulse Rate: 96 (03/21 2001) SPO2; FIO2:   Intake/Output Summary (Last 24 hours) at 12/27/15 2129 Last data filed at 12/27/15 2124  Gross per 24 hour  Intake   2900 ml  Output   3350 ml  Net   -450 ml      Exam: General:  agitated, wants ventilator DC'd, acute on chronic respiratory distress Eyes: Negative headache, negative scleral hemorrhage ENT: Negative Runny nose, negative gingival bleeding, Neck:  Negative scars, masses, torticollis, lymphadenopathy, JVD Lungs: Clear to auscultation bilaterally without wheezes or crackles Cardiovascular:  tachycardic, Regular rhythm without murmur gallop or rub normal S1 and S2 Abdomen:negative abdominal pain, nondistended, positive soft, bowel sounds, no rebound, no ascites, no appreciable mass Extremities: No significant cyanosis, clubbing, or edema bilateral lower extremities, muscle atrophy consistent with paralysis  Psychiatric:  Negative depression positive anxiety, negative fatigue, negative mania  Neurologic:  Cranial nerves II through XII intact, tongue/uvula midline,  LUE gross muscle strength intact, fine motor strength impaired, right hemiparesis, bilateral lower extremity paresis, negative expressive aphasiaia.   Data Reviewed: Basic Metabolic Panel:  Recent Labs Lab 12/21/15 0527 12/22/15 0350 12/23/15 0530 12/24/15 0500 12/26/15 0907 12/27/15 0528  NA 146* 145 143 140  --  136  K 4.9 4.8 4.9 4.5  --  4.0  CL 108 109 108 105  --  105  CO2 25 25 24 25   --  21*  GLUCOSE 382* 347* 433* 274*  --  331*  BUN 41* 41* 45* 42*  --  54*  CREATININE 2.27* 2.26* 2.26* 1.99* 2.30* 2.11*  CALCIUM 11.7* 11.7* 12.1* 11.8*  --  10.4*  MG 1.5*  --   --   --   --  1.7  PHOS 3.6  --   --   --   --  3.1   Liver Function Tests:  Recent Labs Lab 12/23/15 0530  AST 15  ALT 11*  ALKPHOS 104  BILITOT 0.3  PROT 8.1  ALBUMIN 2.1*   No results for input(s): LIPASE, AMYLASE in the last 168 hours. No results for input(s): AMMONIA in the last 168 hours. CBC:  Recent Labs Lab 12/22/15 0350 12/23/15 0530 12/26/15 0907 12/26/15 2116 12/27/15 0528  WBC 33.9* 29.6* 21.8* 27.9* 32.0*  HGB 7.7* 7.4* 6.5* 7.8* 7.9*  HCT 27.6* 26.4* 21.9*  25.7* 26.3*  MCV 86.3 84.1 81.1 80.3 80.7  PLT 722* 749* 582* 545* 575*   Cardiac Enzymes: No results for input(s): CKTOTAL, CKMB, CKMBINDEX, TROPONINI in the last 168 hours. BNP (last 3 results) No results for input(s): BNP in the last 8760 hours.  ProBNP (last 3 results) No results for input(s): PROBNP in the last 8760 hours.  CBG:  Recent Labs Lab 12/27/15 0001 12/27/15 0346 12/27/15 0752 12/27/15 1211 12/27/15 1715  GLUCAP 226* 324* 411* 422* 390*    Recent Results (from the past 240 hour(s))  Culture, blood (Routine X 2) w Reflex to ID Panel     Status: None   Collection Time: 12/18/15  5:05 PM  Result Value Ref Range Status   Specimen Description LEFT ANTECUBITAL  Final   Special Requests BOTTLES DRAWN AEROBIC ONLY 8CC  Final   Culture  Setup Time   Final    GRAM POSITIVE COCCI IN CLUSTERS AEROBIC BOTTLE ONLY CRITICAL RESULT CALLED TO, READ BACK BY AND VERIFIED WITH: STEPHANIE VIVERITO,KRN AT 0827 12/20/15 BY K BARR    Culture   Final    STAPHYLOCOCCUS SPECIES (COAGULASE NEGATIVE) THE SIGNIFICANCE OF ISOLATING THIS ORGANISM FROM A SINGLE SET OF BLOOD CULTURES WHEN MULTIPLE SETS ARE DRAWN IS UNCERTAIN. PLEASE NOTIFY THE MICROBIOLOGY DEPARTMENT WITHIN ONE WEEK IF SPECIATION AND SENSITIVITIES ARE REQUIRED.    Report Status 12/23/2015 FINAL  Final  C difficile quick scan w PCR reflex     Status: None   Collection Time: 12/19/15  2:00 PM  Result Value Ref Range Status   C Diff antigen NEGATIVE NEGATIVE Final   C Diff toxin NEGATIVE NEGATIVE Final   C Diff interpretation Negative for toxigenic C. difficile  Final  Culture, blood (routine x 2)     Status: None   Collection Time: 12/20/15  3:20 PM  Result Value Ref Range Status   Specimen Description BLOOD LEFT ANTECUBITAL  Final   Special Requests BOTTLES DRAWN AEROBIC AND ANAEROBIC 5CC  Final   Culture NO GROWTH 5 DAYS  Final   Report Status 12/25/2015 FINAL  Final  Culture, blood (routine x 2)     Status: None    Collection Time: 12/20/15  3:29 PM  Result Value Ref Range Status   Specimen Description BLOOD LEFT HAND  Final   Special Requests IN PEDIATRIC BOTTLE 3CC  Final   Culture NO GROWTH 5 DAYS  Final   Report Status 12/25/2015 FINAL  Final  Culture, respiratory (NON-Expectorated)     Status: None (Preliminary result)   Collection Time: 12/24/15  1:45 PM  Result Value Ref Range Status   Specimen Description TRACHEAL ASPIRATE  Final   Special Requests Normal  Final   Gram Stain   Final    ABUNDANT WBC PRESENT, PREDOMINANTLY PMN FEW  SQUAMOUS EPITHELIAL CELLS PRESENT NO ORGANISMS SEEN THIS SPECIMEN IS ACCEPTABLE FOR SPUTUM CULTURE Performed at [facility]    Culture   Final    RARE STAPHYLOCOCCUS AUREUS Note: RIFAMPIN AND GENTAMICIN SHOULD NOT BE USED AS SINGLE DRUGS FOR TREATMENT OF STAPH INFECTIONS. Performed at [facility]    Report Status PENDING  Incomplete  Culture, blood (routine x 2)     Status: None (Preliminary result)   Collection Time: 12/25/15  6:46 PM  Result Value Ref Range Status   Specimen Description BLOOD LEFT ARM  Final   Special Requests IN PEDIATRIC BOTTLE 3CC  Final   Culture NO GROWTH 2 DAYS  Final   Report Status PENDING  Incomplete  Culture, blood (routine x 2)     Status: None (Preliminary result)   Collection Time: 12/25/15  6:52 PM  Result Value Ref Range Status   Specimen Description BLOOD LEFT ARM  Final   Special Requests IN PEDIATRIC BOTTLE 3CC  Final   Culture NO GROWTH 2 DAYS  Final   Report Status PENDING  Incomplete     Studies:  Recent x-ray studies have been reviewed in detail by the Attending Physician  Scheduled Meds:  Scheduled Meds: . acetaminophen (TYLENOL) oral liquid 160 mg/5 mL  500 mg Oral TID  . antiseptic oral rinse  7 mL Mouth Rinse QID  . buPROPion  150 mg Oral Daily  . ceFEPime (MAXIPIME) IV  1 g Intravenous Q12H  . chlorhexidine gluconate  15 mL Mouth Rinse BID  . famotidine (PEPCID) IV  20 mg  Intravenous Q12H  . fentaNYL  50 mcg Transdermal Q72H  . fluconazole  100 mg Oral Daily  . free water  200 mL Per Tube Q6H  . heparin  5,000 Units Subcutaneous 3 times per day  . insulin aspart  0-20 Units Subcutaneous 6 times per day  . insulin glargine  8 Units Subcutaneous Daily  . LORazepam  0.5 mg Oral TID  . methylPREDNISolone (SOLU-MEDROL) injection  40 mg Intravenous Q12H  . midodrine  5 mg Oral TID AC  . mupirocin cream   Topical Daily  . naLOXone (NARCAN)  injection  0.4 mg Intramuscular Once  . pantoprazole sodium  40 mg Per Tube Daily  . sodium chloride flush  10-40 mL Intracatheter Q12H  . vancomycin  750 mg Intravenous Q48H    Time spent on care of this patient: 40 mins   WOODS, Geraldo Docker , MD  [facility] Office  [phone] Pager - [phone]  On-Call/Text Page:      [email or web address]      password [email or web address]  If 7PM-7AM, please contact night-coverage [email or web address] Password [email or web address] 12/27/2015, 9:29 PM   LOS: 21 days   Care during the described time interval was provided by me .  I have reviewed this patient's available data, including medical history, events of note, physical examination, and all test results as part of my evaluation. I have personally reviewed and interpreted all radiology studies.   Dia Crawford, MD [phone] Pager

## 2015-12-27 NOTE — Progress Notes (Signed)
PULMONARY / CRITICAL CARE MEDICINE   Name: Jason [name] MRN: [id number] DOB: Jun 11, 1986    ADMISSION DATE:  12/06/2015 CONSULTATION DATE:  12/06/15  REFERRING MD:  EDP  CHIEF COMPLAINT:  Decrease in O2 sats, fever, PNA  SUBJECTIVE: No events overnight, did not tolerate wean this AM due to increase RR and dec Tv.  VITAL SIGNS: BP 126/87 mmHg  Pulse 101  Temp(Src) 97.4 F (36.3 C) (Axillary)  Resp 14  Ht 5\' 8"  (1.727 m)  Wt 56.9 kg (125 lb 7.1 oz)  BMI 19.08 kg/m2  SpO2 100%  VENTILATOR SETTINGS: Vent Mode:  [-] PRVC FiO2 (%):  [30 %] 30 % Set Rate:  [14 bmp] 14 bmp Vt Set:  [550 mL] 550 mL PEEP:  [5 cmH20] 5 cmH20 Pressure Support:  [10 cmH20] 10 cmH20 Plateau Pressure:  [17 cmH20-20 cmH20] 20 cmH20  INTAKE / OUTPUT: I/O last 3 completed shifts: In: 109 [I.V.:3375; Blood:335; NG/GT:2615; IV Piggyback:350] Out: [id number] [Urine:3450; Drains:375; Stool:650]  PHYSICAL EXAMINATION: General: Chronically ill --> interacts some Neuro: Opens eyes , gestures to his mother and communicates with her HEENT: PERRL, Clean trach site. Cardiovascular: RRR, no MRG Lungs: diffuse rhonchi.  Abdomen: PEG in place, + BS, ostomy Musculoskeletal: Muscle wasting bilaterally. Skin: No rashes. Sacral wound vac in place.   LABS:  BMET  Recent Labs Lab 12/23/15 0530 12/24/15 0500 12/26/15 0907 12/27/15 0528  NA 143 140  --  136  K 4.9 4.5  --  4.0  CL 108 105  --  105  CO2 24 25  --  21*  BUN 45* 42*  --  54*  CREATININE 2.26* 1.99* 2.30* 2.11*  GLUCOSE 433* 274*  --  331*    Electrolytes  Recent Labs Lab 12/21/15 0527  12/23/15 0530 12/24/15 0500 12/27/15 0528  CALCIUM 11.7*  < > 12.1* 11.8* 10.4*  MG 1.5*  --   --   --  1.7  PHOS 3.6  --   --   --  3.1  < > = values in this interval not displayed.  CBC  Recent Labs Lab 12/26/15 0907 12/26/15 2116 12/27/15 0528  WBC 21.8* 27.9* 32.0*  HGB 6.5* 7.8* 7.9*  HCT 21.9* 25.7* 26.3*  PLT 582* 545* 575*     Coag's No results for input(s): APTT, INR in the last 168 hours.  Sepsis Markers No results for input(s): LATICACIDVEN, PROCALCITON, O2SATVEN in the last 168 hours.  ABG  Recent Labs Lab 12/22/15 1145  PHART 7.365  PCO2ART 43.1  PO2ART 86.9    Liver Enzymes  Recent Labs Lab 12/23/15 0530  AST 15  ALT 11*  ALKPHOS 104  BILITOT 0.3  ALBUMIN 2.1*    Cardiac Enzymes No results for input(s): TROPONINI, PROBNP in the last 168 hours.  Glucose  Recent Labs Lab 12/26/15 2033 12/26/15 2244 12/27/15 0001 12/27/15 0346 12/27/15 0752 12/27/15 1211  GLUCAP 221* 209* 226* 324* 411* 422*    Imaging No results found.   STUDIES:  CXR 02/28 > bibasilar airspace densities concerning for PNA. CT abdo/ pelvis>>>no fistula CXR 3/7 >>mildly increased bibasilar opacities are noted most consistent with subsegmental atelectasis and associated pleural effusions.  EEG 03/13 > mild to mod diffuse slowing with triphasic waves (could be seen with hypoxic/ischemic injury, toxic/metabolic encephalopathies (hepatic?), med effect). CXR 3/14 >> Consolidation medial right base with small right effusion, left lung clear  CULTURES: Blood 02/28 >>No growth  Urine 02/28 >>Multiple species, suggest recollection Blood 3/1>> No growth  Sputum 3/6 >>> few pseudomonas >>>MDR Blood Culture 3/6 >> neg Urine Culture 3/7 >>Yeast  MRSA pos screen Blood 03/12 > GPC's > coag neg staph CDiff 3/13 >> Negative  Blood 3/14 >>  ANTIBIOTICS: Vanc 02/28 (7/7) >> 3/7 Levaquin 02/28 (7/7) >>3/7 Tigecycline 3/7 >> 3/9 Aztreonam 3/7 >> 3/9 Colistin 3/9 >> 3/13 Fluconazole 03/9 > Vanc 03/14 > Inhaled amikacin 3/16>>> Cefepime 3/19>>>  SIGNIFICANT EVENTS: 2/28  Admitted with probable HCAP 3/2- no fevers, low secretions, remains vented, refusing Chest PT and other treatments 3/3 improved to TC, pcxr better 3/5- trach collar 24  Hrs 3/6- Tmax 103.2 3/8 still spiking temps  3/9 Colistin started.   3/13: encephalopathic, rising creatinine, hyperkalemic.  3/14 > remains encephalopathic.  1/2 blood cx's with GPC's.  3/16 : unresponsive. Resp pattern worse, agonal. Adding inhaled amikacin, placed back on vent. Began talks w/ mother and father about EOL. They understand he may not survive. Mom not ready to make DNR, but says "she is prepared and won't let Jason [name] suffer".   LINES/TUBES: Chronic trach / PEG >> Suprapubic catheter changed 3/8  I reviewed CXR myself, trach in good position, infiltrate noted.  ASSESSMENT / PLAN:  PULMONARY A: Chronic respiratory failure with trach dependence - Now acute on Chronic respiratory failure on 3/16 w/ agonal resp pattern.  Tracheostomy status MDR pseudeomonas PNA -->concerned that his clinical decline reflects the result of [name] stopping treatment for the pseudomonas. We had hoped that this was a colonizer, but his rising WBC, resp distress & worsening CXR and overall clinical decline would reflect otherwise.  P:  PS as able, progress to TC as able but highly doubt will be able to tolerate, severely deconditioned.. See ID section. CXR intermittently.  CARDIOVASCULAR A:  Chronic hypotension - on midodrine P:  Continue midodrine at current dose.  Monitor hemodynamics.  INFECTIOUS A:   Bacteremia - 1/2 blood cultures from 03/12 with GPC's.  Also with increasing leukocytosis as of 03/14 UTI - hx pseudomonal UTI in past with resistance to multiple abx - positive for yeast  HCAP-->pseudomonas MDR given clinical decline feel that this is more than a colonizer.  Hx sacral wounds P:   ID following - appreciate the assistance. Abx per ID. WOC following.  Discussed with bedside RN and RT.  Rush Farmer, M.D. [facility] Pulmonary/Critical Care Medicine. Pager: [phone]. After hours pager: [phone].

## 2015-12-28 ENCOUNTER — Ambulatory Visit: Payer: Self-pay | Admitting: Infectious Disease

## 2015-12-28 DIAGNOSIS — Z430 Encounter for attention to tracheostomy: Secondary | ICD-10-CM

## 2015-12-28 LAB — CULTURE, RESPIRATORY W GRAM STAIN

## 2015-12-28 LAB — CULTURE, RESPIRATORY: SPECIAL REQUESTS: NORMAL

## 2015-12-28 LAB — GLUCOSE, CAPILLARY
GLUCOSE-CAPILLARY: 451 mg/dL — AB (ref 65–99)
Glucose-Capillary: 451 mg/dL — ABNORMAL HIGH (ref 65–99)
Glucose-Capillary: 445 mg/dL — ABNORMAL HIGH (ref 65–99)
Glucose-Capillary: 329 mg/dL — ABNORMAL HIGH (ref 65–99)
Glucose-Capillary: 292 mg/dL — ABNORMAL HIGH (ref 65–99)

## 2015-12-28 MED ORDER — LORAZEPAM 0.5 MG PO TABS
0.5000 mg | ORAL_TABLET | Freq: Three times a day (TID) | ORAL | Status: DC
  Administered 2015-12-28 – 2016-01-05 (×23): 0.5 mg
  Filled 2015-12-28 (×23): qty 1

## 2015-12-28 MED ORDER — ACETAMINOPHEN 325 MG PO TABS
650.0000 mg | ORAL_TABLET | Freq: Four times a day (QID) | ORAL | Status: DC | PRN
  Administered 2016-01-02: 650 mg
  Filled 2015-12-28: qty 2

## 2015-12-28 MED ORDER — FENTANYL 25 MCG/HR TD PT72: 50.0000 ug | MEDICATED_PATCH | TRANSDERMAL | Status: DC

## 2015-12-28 MED ORDER — BACLOFEN 10 MG PO TABS
5.0000 mg | ORAL_TABLET | Freq: Four times a day (QID) | ORAL | Status: DC
  Administered 2015-12-28 – 2016-01-05 (×30): 5 mg
  Filled 2015-12-28 (×29): qty 1

## 2015-12-28 MED ORDER — OXYCODONE HCL 5 MG PO TABS
10.0000 mg | ORAL_TABLET | Freq: Four times a day (QID) | ORAL | Status: DC | PRN
  Administered 2015-12-28 – 2015-12-30 (×4): 10 mg
  Administered 2015-12-31: 20 mg
  Administered 2015-12-31: 10 mg
  Administered 2015-12-31 – 2016-01-03 (×9): 20 mg
  Filled 2015-12-28: qty 2
  Filled 2015-12-28: qty 4
  Filled 2015-12-28 (×3): qty 2
  Filled 2015-12-28 (×2): qty 4
  Filled 2015-12-28: qty 2
  Filled 2015-12-28 (×7): qty 4

## 2015-12-28 MED ORDER — METHYLPREDNISOLONE SODIUM SUCC 40 MG IJ SOLR
20.0000 mg | Freq: Two times a day (BID) | INTRAMUSCULAR | Status: AC
  Administered 2015-12-28 – 2016-01-03 (×14): 20 mg via INTRAVENOUS
  Filled 2015-12-28 (×14): qty 1

## 2015-12-28 MED ORDER — OXYCODONE HCL 5 MG PO TABS: 20.0000 mg | ORAL_TABLET | Freq: Four times a day (QID) | ORAL | Status: DC | PRN

## 2015-12-28 MED ORDER — MIDODRINE HCL 5 MG PO TABS
5.0000 mg | ORAL_TABLET | Freq: Three times a day (TID) | ORAL | Status: DC
  Administered 2015-12-29 – 2016-01-05 (×22): 5 mg
  Filled 2015-12-28 (×22): qty 1

## 2015-12-28 MED ORDER — INSULIN GLARGINE 100 UNIT/ML ~~LOC~~ SOLN
12.0000 [IU] | Freq: Every day | SUBCUTANEOUS | Status: DC
  Administered 2015-12-29: 12 [IU] via SUBCUTANEOUS
  Filled 2015-12-28: qty 0.12

## 2015-12-28 MED ORDER — OXYCODONE HCL 5 MG PO TABS: 10.0000 mg | ORAL_TABLET | Freq: Four times a day (QID) | ORAL | Status: DC | PRN

## 2015-12-28 MED ORDER — ACETAMINOPHEN 160 MG/5ML PO SOLN
500.0000 mg | Freq: Three times a day (TID) | ORAL | Status: DC
  Administered 2015-12-28 – 2016-01-05 (×22): 500 mg
  Filled 2015-12-28 (×23): qty 20.3

## 2015-12-28 MED ORDER — DEXTROSE 50 % IV SOLN: 1.0000 | INTRAVENOUS | Status: DC | PRN

## 2015-12-28 NOTE — Progress Notes (Signed)
Inpatient Diabetes Program Recommendations  AACE/ADA: New Consensus Statement on Inpatient Glycemic Control (2015)  Target Ranges:  Prepandial:   less than 140 mg/dL      Peak postprandial:   less than 180 mg/dL (1-2 hours)      Critically ill patients:  140 - 180 mg/dL   Review of Glycemic Control  Results for JANA, MASTIN (MRN [id number]) as of 12/28/2015 10:19  Ref. Range 12/27/2015 17:15 12/27/2015 20:56 12/27/2015 23:21 12/28/2015 03:54 12/28/2015 08:08  Glucose-Capillary Latest Ref Range: 65-99 mg/dL 390 (H) 320 (H) 259 (H) 451 (H) 445 (H)   Needs insulin adjustment.  Inpatient Diabetes Program Recommendations:  Increase Lantus to 12 units QD Add Novolog 4 units Q4H for TF coverage Decrease Novolog to sensitive Q4H since pt is Type 1.  Will continue to follow. Thank you. Lorenda Peck, RD, LDN, CDE Inpatient Diabetes Coordinator [phone]

## 2015-12-28 NOTE — Progress Notes (Signed)
Palliative:  I met with Jason [name] again today who is more alert. Mother is asleep in recliner at bedside - appeared to look at me at one time but did not engage. Difficult to understand [name] and he seemed to become frustrated and fall back asleep. I did reassure him that his lungs are not strong enough to come off the vent but we are still working with him and trying to get him off. I will continue to follow.   Vinie Sill, NP Palliative Medicine Team Pager # [phone] (M-F 8a-5p) Team Phone # [phone] (Nights/Weekends)

## 2015-12-28 NOTE — Progress Notes (Addendum)
[place] for Infectious Disease   Reason for visit: Follow up on VAP  Interval History: afebrile, on cefepime with premedication and no issues.  Some mild improvement. WBC up but also on premedication with steroids.     Physical Exam: Constitutional:  Filed Vitals:   12/28/15 0745 12/28/15 0800  BP: 130/89 137/94  Pulse: 86 87  Temp:    Resp: 17 14   patient appears in NAD, minimally responsive but does open eyes Eyes: anicteric HENT: + trach Respiratory: on vent Cardiovascular: Tachy RR GI: soft, nt, nd Skin: no rashes  Review of Systems: Unable to be assessed due to mental status  Lab Results  Component Value Date   WBC 32.0* 12/27/2015   HGB 7.9* 12/27/2015   HCT 26.3* 12/27/2015   MCV 80.7 12/27/2015   PLT 575* 12/27/2015    Lab Results  Component Value Date   CREATININE 2.11* 12/27/2015   BUN 54* 12/27/2015   NA 136 12/27/2015   K 4.0 12/27/2015   CL 105 12/27/2015   CO2 21* 12/27/2015    Lab Results  Component Value Date   ALT 11* 12/23/2015   AST 15 12/23/2015   ALKPHOS 104 12/23/2015     Microbiology: Recent Results (from the past 240 hour(s))  Culture, blood (Routine X 2) w Reflex to ID Panel     Status: None   Collection Time: 12/18/15  5:05 PM  Result Value Ref Range Status   Specimen Description LEFT ANTECUBITAL  Final   Special Requests BOTTLES DRAWN AEROBIC ONLY 8CC  Final   Culture  Setup Time   Final    GRAM POSITIVE COCCI IN CLUSTERS AEROBIC BOTTLE ONLY CRITICAL RESULT CALLED TO, READ BACK BY AND VERIFIED WITH: STEPHANIE VIVERITO,KRN AT 0827 12/20/15 BY K BARR    Culture   Final    STAPHYLOCOCCUS SPECIES (COAGULASE NEGATIVE) THE SIGNIFICANCE OF ISOLATING THIS ORGANISM FROM A SINGLE SET OF BLOOD CULTURES WHEN MULTIPLE SETS ARE DRAWN IS UNCERTAIN. PLEASE NOTIFY THE MICROBIOLOGY DEPARTMENT WITHIN ONE WEEK IF SPECIATION AND SENSITIVITIES ARE REQUIRED.    Report Status 12/23/2015 FINAL  Final  C difficile quick scan w PCR reflex      Status: None   Collection Time: 12/19/15  2:00 PM  Result Value Ref Range Status   C Diff antigen NEGATIVE NEGATIVE Final   C Diff toxin NEGATIVE NEGATIVE Final   C Diff interpretation Negative for toxigenic C. difficile  Final  Culture, blood (routine x 2)     Status: None   Collection Time: 12/20/15  3:20 PM  Result Value Ref Range Status   Specimen Description BLOOD LEFT ANTECUBITAL  Final   Special Requests BOTTLES DRAWN AEROBIC AND ANAEROBIC 5CC  Final   Culture NO GROWTH 5 DAYS  Final   Report Status 12/25/2015 FINAL  Final  Culture, blood (routine x 2)     Status: None   Collection Time: 12/20/15  3:29 PM  Result Value Ref Range Status   Specimen Description BLOOD LEFT HAND  Final   Special Requests IN PEDIATRIC BOTTLE 3CC  Final   Culture NO GROWTH 5 DAYS  Final   Report Status 12/25/2015 FINAL  Final  Culture, respiratory (NON-Expectorated)     Status: None   Collection Time: 12/24/15  1:45 PM  Result Value Ref Range Status   Specimen Description TRACHEAL ASPIRATE  Final   Special Requests Normal  Final   Gram Stain   Final    ABUNDANT WBC PRESENT,  PREDOMINANTLY PMN FEW SQUAMOUS EPITHELIAL CELLS PRESENT NO ORGANISMS SEEN THIS SPECIMEN IS ACCEPTABLE FOR SPUTUM CULTURE Performed at [facility]    Culture   Final    RARE METHICILLIN RESISTANT STAPHYLOCOCCUS AUREUS 22 Note: RIFAMPIN AND GENTAMICIN SHOULD NOT BE USED AS SINGLE DRUGS FOR TREATMENT OF STAPH INFECTIONS. CRITICAL RESULT CALLED TO, READ BACK BY AND VERIFIED WITH: TROYCE H. 3 17 @ 0821 BY PARDA Performed at [facility]    Report Status 12/28/2015 FINAL  Final   Organism ID, Bacteria METHICILLIN RESISTANT STAPHYLOCOCCUS AUREUS  Final      Susceptibility   Methicillin resistant staphylococcus aureus - MIC*    CLINDAMYCIN >=8 RESISTANT Resistant     ERYTHROMYCIN >=8 RESISTANT Resistant     GENTAMICIN <=0.5 SENSITIVE Sensitive     LEVOFLOXACIN >=8 RESISTANT Resistant     OXACILLIN >=4  RESISTANT Resistant     RIFAMPIN <=0.5 SENSITIVE Sensitive     TRIMETH/SULFA >=320 RESISTANT Resistant     VANCOMYCIN 1 SENSITIVE Sensitive     TETRACYCLINE >=16 RESISTANT Resistant     * RARE METHICILLIN RESISTANT STAPHYLOCOCCUS AUREUS  Culture, blood (routine x 2)     Status: None (Preliminary result)   Collection Time: 12/25/15  6:46 PM  Result Value Ref Range Status   Specimen Description BLOOD LEFT ARM  Final   Special Requests IN PEDIATRIC BOTTLE 3CC  Final   Culture NO GROWTH 2 DAYS  Final   Report Status PENDING  Incomplete  Culture, blood (routine x 2)     Status: None (Preliminary result)   Collection Time: 12/25/15  6:52 PM  Result Value Ref Range Status   Specimen Description BLOOD LEFT ARM  Final   Special Requests IN PEDIATRIC BOTTLE 3CC  Final   Culture NO GROWTH 2 DAYS  Final   Report Status PENDING  Incomplete    Impression/Plan:  1. VAP - MDR Pseudomonas.  On cefepime.  I will stop amikacin.  Will need to continue premedication, though I suspect allergy not true allergy, but hard to tease out.    2. Chronic respiratory failure - multiple infections in the past.  Trying to wean.    3. Leukocytosis - worsening status as well as steroids.   Discussed with mother at bedside.    ADDENDUM: he has completed 14 days of fluconazole and will stop.

## 2015-12-28 NOTE — Progress Notes (Signed)
PULMONARY / CRITICAL CARE MEDICINE   Name: Jason [name] MRN: [id number] DOB: October 13, 1985    ADMISSION DATE:  12/06/2015 CONSULTATION DATE:  12/06/15  REFERRING MD:  EDP  CHIEF COMPLAINT:  Decrease in O2 sats, fever, PNA  SUBJECTIVE: No events overnight, failed weaning again this AM.  VITAL SIGNS: BP 139/92 mmHg  Pulse 99  Temp(Src) 100.6 F (38.1 C) (Axillary)  Resp 17  Ht 5\' 8"  (1.727 m)  Wt 53.751 kg (118 lb 8 oz)  BMI 18.02 kg/m2  SpO2 100%  VENTILATOR SETTINGS: Vent Mode:  [-] PRVC FiO2 (%):  [30 %] 30 % Set Rate:  [14 bmp] 14 bmp Vt Set:  [550 mL] 550 mL PEEP:  [5 cmH20] 5 cmH20 Pressure Support:  [10 cmH20] 10 cmH20 Plateau Pressure:  [18 cmH20-20 cmH20] 18 cmH20  INTAKE / OUTPUT: I/O last 3 completed shifts: In: 5095 [I.V.:2410; SN:[id number]; IV Piggyback:200] Out: 5100 [Urine:4500; Stool:600]  PHYSICAL EXAMINATION: General: Chronically ill --> interacting more this AM. Neuro: Opens eyes , gestures to his mother and communicates with her HEENT: PERRL, Clean trach site. Cardiovascular: RRR, no MRG Lungs: diffuse coarse BS.  Abdomen: PEG in place, + BS, ostomy Musculoskeletal: Muscle wasting bilaterally. Skin: No rashes. Sacral wound vac in place.   LABS:  BMET  Recent Labs Lab 12/23/15 0530 12/24/15 0500 12/26/15 0907 12/27/15 0528  NA 143 140  --  136  K 4.9 4.5  --  4.0  CL 108 105  --  105  CO2 24 25  --  21*  BUN 45* 42*  --  54*  CREATININE 2.26* 1.99* 2.30* 2.11*  GLUCOSE 433* 274*  --  331*    Electrolytes  Recent Labs Lab 12/23/15 0530 12/24/15 0500 12/27/15 0528  CALCIUM 12.1* 11.8* 10.4*  MG  --   --  1.7  PHOS  --   --  3.1    CBC  Recent Labs Lab 12/26/15 0907 12/26/15 2116 12/27/15 0528  WBC 21.8* 27.9* 32.0*  HGB 6.5* 7.8* 7.9*  HCT 21.9* 25.7* 26.3*  PLT 582* 545* 575*    Coag's No results for input(s): APTT, INR in the last 168 hours.  Sepsis Markers No results for input(s): LATICACIDVEN,  PROCALCITON, O2SATVEN in the last 168 hours.  ABG  Recent Labs Lab 12/22/15 1145  PHART 7.365  PCO2ART 43.1  PO2ART 86.9    Liver Enzymes  Recent Labs Lab 12/23/15 0530  AST 15  ALT 11*  ALKPHOS 104  BILITOT 0.3  ALBUMIN 2.1*    Cardiac Enzymes No results for input(s): TROPONINI, PROBNP in the last 168 hours.  Glucose  Recent Labs Lab 12/27/15 1715 12/27/15 2056 12/27/15 2321 12/28/15 0354 12/28/15 0808 12/28/15 1243  GLUCAP 390* 320* 259* 451* 445* 451*    Imaging No results found.   STUDIES:  CXR 02/28 > bibasilar airspace densities concerning for PNA. CT abdo/ pelvis>>>no fistula CXR 3/7 >>mildly increased bibasilar opacities are noted most consistent with subsegmental atelectasis and associated pleural effusions.  EEG 03/13 > mild to mod diffuse slowing with triphasic waves (could be seen with hypoxic/ischemic injury, toxic/metabolic encephalopathies (hepatic?), med effect). CXR 3/14 >> Consolidation medial right base with small right effusion, left lung clear  CULTURES: Blood 02/28 >>No growth  Urine 02/28 >>Multiple species, suggest recollection Blood 3/1>> No growth  Sputum 3/6 >>> few pseudomonas >>>MDR Blood Culture 3/6 >> neg Urine Culture 3/7 >>Yeast  MRSA pos screen Blood 03/12 > GPC's > coag neg staph CDiff 3/13 >>  Negative  Blood 3/14 >>  ANTIBIOTICS: Vanc 02/28 (7/7) >> 3/7 Levaquin 02/28 (7/7) >>3/7 Tigecycline 3/7 >> 3/9 Aztreonam 3/7 >> 3/9 Colistin 3/9 >> 3/13 Fluconazole 03/9 > Vanc 03/14 > Inhaled amikacin 3/16>>> Cefepime 3/19>>>  SIGNIFICANT EVENTS: 2/28  Admitted with probable HCAP 3/2- no fevers, low secretions, remains vented, refusing Chest PT and other treatments 3/3 improved to TC, pcxr better 3/5- trach collar 24  Hrs 3/6- Tmax 103.2 3/8 still spiking temps  3/9 Colistin started.  3/13: encephalopathic, rising creatinine, hyperkalemic.  3/14 > remains encephalopathic.  1/2 blood cx's with GPC's.  3/16 :  unresponsive. Resp pattern worse, agonal. Adding inhaled amikacin, placed back on vent. Began talks w/ mother and father about EOL. They understand he may not survive. Mom not ready to make DNR, but says "she is prepared and won't let Jason [name] suffer".   LINES/TUBES: Chronic trach / PEG >> Suprapubic catheter changed 3/8  I reviewed CXR myself, trach in good position, infiltrate noted.  ASSESSMENT / PLAN:  PULMONARY A: Chronic respiratory failure with trach dependence - Now acute on Chronic respiratory failure on 3/16 w/ agonal resp pattern.  Tracheostomy status MDR pseudeomonas PNA -->concerned that his clinical decline reflects the result of [name] stopping treatment for the pseudomonas. We had hoped that this was a colonizer, but his rising WBC, resp distress & worsening CXR and overall clinical decline would reflect otherwise.  P:  PS as able, I do not foresee Jason [name] coming off the vent, I believe he is so deconditioned that he will likely need a ventilator outside the hospital. See ID section. CXR in AM.  CARDIOVASCULAR A:  Chronic hypotension - on midodrine P:  Continue midodrine at current dose.  Monitor hemodynamics.  INFECTIOUS A:   Bacteremia - 1/2 blood cultures from 03/12 with GPC's.  Also with increasing leukocytosis as of 03/14 UTI - hx pseudomonal UTI in past with resistance to multiple abx - positive for yeast  HCAP-->pseudomonas MDR given clinical decline feel that this is more than a colonizer.  Hx sacral wounds P:   ID following - appreciate the assistance. Abx per ID. WOC following.  Discussed with bedside RN and RT.  Rush Farmer, M.D. [facility] Pulmonary/Critical Care Medicine. Pager: [phone]. After hours pager: [phone].

## 2015-12-28 NOTE — Consult Note (Addendum)
WOC follow-up: Patient remains leaking yellow drainage from rectum around [facility] and this is saturating chux underneath patient; Vac dressing would be unable to maintain a seal related to the close proximity to the rectum and constant moisture.  Continue moist gauze packing to bilat ischium and sacrum wounds until this problem is resolved.  Attempted to discuss plan of care with mother but she is asleep; informed bedside nurse. Julien Girt MSN, RN, [place], [place], [place]

## 2015-12-28 NOTE — Progress Notes (Signed)
[place] TEAM 1 - Stepdown/ICU TEAM PROGRESS NOTE  Jason [name] [id number] DOB: 13-Dec-1985 DOA: 12/06/2015 PCP: [name], MD  Admit HPI / Brief Narrative: 30 y.o. 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  He had been doing well and had not required ventilator support since roughly November 2016. He had been able to use PMV 24 hours a day without problem.  On 12/06/15, he was brought to [facility] ED due to drop in O2 saturations. This began 4 - 5 days prior and got to a low of 88%. Mother began to put him on the vent for 2 - 4 hours at a time in hopes of improving O2 sats. She also noticed that his heart rate would intermittently climb into the low 100's. He had a low grade fever, but his mom reported this had been going on for over a month. Denied any cough.   In ED, he had saturations in high 90's on his home vent settings. CXR revealed bibasilar densities concerning for PNA. PCCM was subsequently called for admission.  Significant Events: 2/28 admit to [facility] by PCCM 3/2 no fevers, low secretions, vented, refusing Chest PT and other treatments 3/3 improved to TC, pcxr better 3/5 trach collar 24 Hrs 3/6 Tmax 103.2 3/8 still spiking temps  3/9 Colistin started  3/13 encephalopathic, rising creatinine, hyperkalemic 3/14 remains encephalopathic - 1/2 blood cx's with GPCs 3/16 unresponsive - resp pattern worse, agonal - added inhaled amikacin, placed back on vent  HPI/Subjective: Failed weaning this AM per PCCM.  The pt is somewhat confused, and tremulous.  His mother feels that he is in pain.  He is not able to provide a hx at the time of my visit.    Assessment/Plan:  VAP/ MDR Pseudeomonas PNA Has severe penicillin allergy and potential cephalosporin allergy but with limited options we are  treating with cefepime w/ premedication - Mother aware of potential for anaphylaxis - ID directing care  MRSA in trach culture  On IV Vanc per ID   Acute on Chronic Respiratory Failure with hypoxia On chronic trach and PRN vent at home - failing attempts to wean off vent - PCCM feels he will likely require permanent vent - I recognize that excessive sedating meds may impair weaning efforts, but I feel we are dealing with a tragic situation in which a 30yo is likely progressing toward his death - in such, I do not wish to see him suffer, and will resume judicious pain and anti-spasmotic meds in an attempt to accomplish comfort without heavy sedation   Seizure Most likely multifactorial sepsis, VAP, Acute on Chronic Respiratory Failure - 3/20 Seizure overnight? - Mother believed secondary to inhaled Amikacin - ID stopped this on 3/21  Anxiety Continue ativan   Chronic hypotension  BP slightly elevated at this time - follow   Acute renal failure Cr peak of 2.3 - trending down - follow   GERD, gastroparesis  Nutrition TF continue   Chronic Anemia  Hgb stable - follow trend   Chronic Thrombocytosis Stable   Yeast UTI Fluconazole course completed under direction of ID  Hx sacral wounds WOC following  DM1  CBG poorly controlled - adjust tx and follow trend - recognize pt has hx of rapidly developing hypoglycemia when attempts are made to strictly control his CBG   Adrenal insuff  No evidence  of decompensation at this time   Acute encephalopathy   Hx seizures (not on meds), paraplegia, fibromyalgia, diabetic neuropathy avoid benadryl (received 03/13 and was very somnolent after)  Code Status: FULL Family Communication: no family present at time of exam Disposition Plan: SDU   Consultants: PCCM  Palliative Care   Antibiotics: Vanc 02/28 > 3/7 Levaquin 02/28 > 3/7 Tigecycline 3/7 > 3/9 Aztreonam 3/7 > 3/9 Colistin 3/9 > 3/13 Fluconazole 03/9 > 3/22 Inhaled  Amikacin 3/16 > 3/21 Vanc 03/14 > Cefepime 3/20 >  DVT prophylaxis: SQ heparin   Objective: Blood pressure 130/91, pulse 91, temperature 99.1 F (37.3 C), temperature source Axillary, resp. rate 14, height 5\' 8"  (1.727 m), weight 53.751 kg (118 lb 8 oz), SpO2 100 %.  Intake/Output Summary (Last 24 hours) at 12/28/15 1749 Last data filed at 12/28/15 1545  Gross per 24 hour  Intake   2955 ml  Output   3950 ml  Net   -995 ml   Exam: General: appears restless and uncomfortable  Lungs: Clear to auscultation bilaterally without wheezes or crackles Cardiovascular: tachycardic at ~100bpm - without murmur  Abdomen: Nontender, nondistended, soft, bowel sounds positive, no rebound, no ascites, no appreciable mass - PEG insertion clean  Extremities: No significant edema bilateral lower extremities  Data Reviewed: Basic Metabolic Panel:  Recent Labs Lab 12/22/15 0350 12/23/15 0530 12/24/15 0500 12/26/15 0907 12/27/15 0528  NA 145 143 140  --  136  K 4.8 4.9 4.5  --  4.0  CL 109 108 105  --  105  CO2 25 24 25   --  21*  GLUCOSE 347* 433* 274*  --  331*  BUN 41* 45* 42*  --  54*  CREATININE 2.26* 2.26* 1.99* 2.30* 2.11*  CALCIUM 11.7* 12.1* 11.8*  --  10.4*  MG  --   --   --   --  1.7  PHOS  --   --   --   --  3.1    CBC:  Recent Labs Lab 12/22/15 0350 12/23/15 0530 12/26/15 0907 12/26/15 2116 12/27/15 0528  WBC 33.9* 29.6* 21.8* 27.9* 32.0*  HGB 7.7* 7.4* 6.5* 7.8* 7.9*  HCT 27.6* 26.4* 21.9* 25.7* 26.3*  MCV 86.3 84.1 81.1 80.3 80.7  PLT 722* 749* 582* 545* 575*    Liver Function Tests:  Recent Labs Lab 12/23/15 0530  AST 15  ALT 11*  ALKPHOS 104  BILITOT 0.3  PROT 8.1  ALBUMIN 2.1*   CBG:  Recent Labs Lab 12/27/15 2321 12/28/15 0354 12/28/15 0808 12/28/15 1243 12/28/15 1606  GLUCAP 259* 451* 445* 451* 329*    Recent Results (from the past 240 hour(s))  C difficile quick scan w PCR reflex     Status: None   Collection Time: 12/19/15  2:00 PM    Result Value Ref Range Status   C Diff antigen NEGATIVE NEGATIVE Final   C Diff toxin NEGATIVE NEGATIVE Final   C Diff interpretation Negative for toxigenic C. difficile  Final  Culture, blood (routine x 2)     Status: None   Collection Time: 12/20/15  3:20 PM  Result Value Ref Range Status   Specimen Description BLOOD LEFT ANTECUBITAL  Final   Special Requests BOTTLES DRAWN AEROBIC AND ANAEROBIC 5CC  Final   Culture NO GROWTH 5 DAYS  Final   Report Status 12/25/2015 FINAL  Final  Culture, blood (routine x 2)     Status: None   Collection Time: 12/20/15  3:29 PM  Result Value Ref Range Status   Specimen Description BLOOD LEFT HAND  Final   Special Requests IN PEDIATRIC BOTTLE 3CC  Final   Culture NO GROWTH 5 DAYS  Final   Report Status 12/25/2015 FINAL  Final  Culture, respiratory (NON-Expectorated)     Status: None   Collection Time: 12/24/15  1:45 PM  Result Value Ref Range Status   Specimen Description TRACHEAL ASPIRATE  Final   Special Requests Normal  Final   Gram Stain   Final    ABUNDANT WBC PRESENT, PREDOMINANTLY PMN FEW SQUAMOUS EPITHELIAL CELLS PRESENT NO ORGANISMS SEEN THIS SPECIMEN IS ACCEPTABLE FOR SPUTUM CULTURE Performed at [facility]    Culture   Final    RARE METHICILLIN RESISTANT STAPHYLOCOCCUS AUREUS 22 Note: RIFAMPIN AND GENTAMICIN SHOULD NOT BE USED AS SINGLE DRUGS FOR TREATMENT OF STAPH INFECTIONS. CRITICAL RESULT CALLED TO, READ BACK BY AND VERIFIED WITH: TROYCE H. 3 17 @ 0821 BY PARDA Performed at [facility]    Report Status 12/28/2015 FINAL  Final   Organism ID, Bacteria METHICILLIN RESISTANT STAPHYLOCOCCUS AUREUS  Final      Susceptibility   Methicillin resistant staphylococcus aureus - MIC*    CLINDAMYCIN >=8 RESISTANT Resistant     ERYTHROMYCIN >=8 RESISTANT Resistant     GENTAMICIN <=0.5 SENSITIVE Sensitive     LEVOFLOXACIN >=8 RESISTANT Resistant     OXACILLIN >=4 RESISTANT Resistant     RIFAMPIN <=0.5 SENSITIVE Sensitive      TRIMETH/SULFA >=320 RESISTANT Resistant     VANCOMYCIN 1 SENSITIVE Sensitive     TETRACYCLINE >=16 RESISTANT Resistant     * RARE METHICILLIN RESISTANT STAPHYLOCOCCUS AUREUS  Culture, blood (routine x 2)     Status: None (Preliminary result)   Collection Time: 12/25/15  6:46 PM  Result Value Ref Range Status   Specimen Description BLOOD LEFT ARM  Final   Special Requests IN PEDIATRIC BOTTLE 3CC  Final   Culture NO GROWTH 3 DAYS  Final   Report Status PENDING  Incomplete  Culture, blood (routine x 2)     Status: None (Preliminary result)   Collection Time: 12/25/15  6:52 PM  Result Value Ref Range Status   Specimen Description BLOOD LEFT ARM  Final   Special Requests IN PEDIATRIC BOTTLE 3CC  Final   Culture NO GROWTH 3 DAYS  Final   Report Status PENDING  Incomplete     Studies:   Recent x-ray studies have been reviewed in detail by the Attending Physician  Scheduled Meds:  Scheduled Meds: . acetaminophen (TYLENOL) oral liquid 160 mg/5 mL  500 mg Oral TID  . antiseptic oral rinse  7 mL Mouth Rinse QID  . buPROPion  150 mg Oral Daily  . ceFEPime (MAXIPIME) IV  1 g Intravenous Q12H  . chlorhexidine gluconate  15 mL Mouth Rinse BID  . famotidine (PEPCID) IV  20 mg Intravenous Q12H  . fentaNYL  50 mcg Transdermal Q72H  . free water  200 mL Per Tube Q6H  . heparin  5,000 Units Subcutaneous 3 times per day  . insulin aspart  0-20 Units Subcutaneous 6 times per day  . insulin glargine  8 Units Subcutaneous Daily  . LORazepam  0.5 mg Oral TID  . methylPREDNISolone (SOLU-MEDROL) injection  20 mg Intravenous Q12H  . midodrine  5 mg Oral TID AC  . mupirocin cream   Topical Daily  . naLOXone [facility])  injection  0.4 mg Intramuscular Once  . sodium chloride flush  10-40 mL Intracatheter Q12H  . vancomycin  750 mg Intravenous Q48H    Time spent on care of this patient: 35 mins   MCCLUNG,JEFFREY T , MD   [facility] Office  [phone] Pager - Text Page per Shea Evans as  per below:  On-Call/Text Page:      [email or web address]      password [email or web address]  If 7PM-7AM, please contact night-coverage [email or web address] Password [facility] 12/28/2015, 5:49 PM   LOS: 22 days

## 2015-12-28 NOTE — Progress Notes (Signed)
Nutrition Follow-up  DOCUMENTATION CODES:   Not applicable  INTERVENTION:  -Continue Glucerna 1.2 @ 65 ml/hr Provides 1872 kcal, 94 grams of protein, 1256 ml of free water.   NUTRITION DIAGNOSIS:   Inadequate oral intake related to inability to eat as evidenced by NPO status.  -Ongoing   GOAL:   Patient will meet greater than or equal to 90% of their needs  -Met  MONITOR:   TF tolerance, Vent status, Labs, Weight trends, Skin, I & O's  ASSESSMENT:   Jason [name] is a 30 y.o. 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 He is followed in trach clinic and he has been doing well and has not required any ventilator support for quite some time, last time roughly November 2016. He had been able to use PMV 24 hours a day without problem.   Patient is currently on ventilator support via trach MV: 7.8 L/min Temp (24hrs), Avg:99.6 F (37.6 C), Min:99.2 F (37.3 C), Max:100.2 F (37.9 C)   Patient currently receiving Glucerna 1.2 @ 65 ml/hr via PEG tube provides 1872 kcal, 94 grams of protein, 1256 ml of free water.   CWOCN and Infectious Disease note 3/22 reviewed.  Palliative Care Team continues to follow.  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  Estimated Nutritional Needs:   Kcal:  4742  Protein:  90-110 gm  Fluid:  >/= 1.7 L  EDUCATION NEEDS:   No education needs identified at this  time  Arthur Holms, RD, LDN Pager #: [phone] After-Hours Pager #: [phone]

## 2015-12-29 ENCOUNTER — Inpatient Hospital Stay (HOSPITAL_COMMUNITY): Payer: Medicaid Other

## 2015-12-29 DIAGNOSIS — E108 Type 1 diabetes mellitus with unspecified complications: Secondary | ICD-10-CM

## 2015-12-29 LAB — CBC
HEMATOCRIT: 27.2 % — AB (ref 39.0–52.0)
HEMOGLOBIN: 8.2 g/dL — AB (ref 13.0–17.0)
MCH: 24.8 pg — AB (ref 26.0–34.0)
MCHC: 30.1 g/dL (ref 30.0–36.0)
MCV: 82.2 fL (ref 78.0–100.0)
Platelets: 655 10*3/uL — ABNORMAL HIGH (ref 150–400)
RBC: 3.31 MIL/uL — AB (ref 4.22–5.81)
RDW: 17 % — ABNORMAL HIGH (ref 11.5–15.5)
WBC: 38.7 10*3/uL — ABNORMAL HIGH (ref 4.0–10.5)

## 2015-12-29 LAB — COMPREHENSIVE METABOLIC PANEL
ALBUMIN: 2.4 g/dL — AB (ref 3.5–5.0)
ALT: 18 U/L (ref 17–63)
ANION GAP: 10 (ref 5–15)
AST: 21 U/L (ref 15–41)
Alkaline Phosphatase: 85 U/L (ref 38–126)
BILIRUBIN TOTAL: 0.4 mg/dL (ref 0.3–1.2)
BUN: 69 mg/dL — AB (ref 6–20)
CHLORIDE: 107 mmol/L (ref 101–111)
CO2: 21 mmol/L — AB (ref 22–32)
Calcium: 10.2 mg/dL (ref 8.9–10.3)
Creatinine, Ser: 1.72 mg/dL — ABNORMAL HIGH (ref 0.61–1.24)
GFR calc Af Amer: 59 mL/min — ABNORMAL LOW (ref >60)
GFR calc non Af Amer: 51 mL/min — ABNORMAL LOW (ref >60)
GLUCOSE: 241 mg/dL — AB (ref 65–99)
POTASSIUM: 4.3 mmol/L (ref 3.5–5.1)
SODIUM: 138 mmol/L (ref 135–145)
TOTAL PROTEIN: 7.4 g/dL (ref 6.5–8.1)

## 2015-12-29 LAB — GLUCOSE, CAPILLARY
GLUCOSE-CAPILLARY: 202 mg/dL — AB (ref 65–99)
GLUCOSE-CAPILLARY: 161 mg/dL — AB (ref 65–99)
Glucose-Capillary: 215 mg/dL — ABNORMAL HIGH (ref 65–99)
Glucose-Capillary: 266 mg/dL — ABNORMAL HIGH (ref 65–99)
Glucose-Capillary: 274 mg/dL — ABNORMAL HIGH (ref 65–99)
Glucose-Capillary: 364 mg/dL — ABNORMAL HIGH (ref 65–99)

## 2015-12-29 MED ORDER — INSULIN GLARGINE 100 UNIT/ML ~~LOC~~ SOLN
16.0000 [IU] | Freq: Every day | SUBCUTANEOUS | Status: DC
  Administered 2015-12-30 – 2016-01-01 (×3): 16 [IU] via SUBCUTANEOUS
  Filled 2015-12-29 (×4): qty 0.16

## 2015-12-29 NOTE — Progress Notes (Signed)
PULMONARY / CRITICAL CARE MEDICINE   Name: Jason [name] MRN: [id number] DOB: 10/12/1985    ADMISSION DATE:  12/06/2015 CONSULTATION DATE:  12/06/15  REFERRING MD:  EDP  CHIEF COMPLAINT:  Decrease in O2 sats, fever, PNA  SUBJECTIVE: No events overnight, failed weaning again this AM.  VITAL SIGNS: BP 137/92 mmHg  Pulse 95  Temp(Src) 100.5 F (38.1 C) (Axillary)  Resp 28  Ht 5\' 8"  (1.727 m)  Wt 54.114 kg (119 lb 4.8 oz)  BMI 18.14 kg/m2  SpO2 100%  VENTILATOR SETTINGS: Vent Mode:  [-] PSV;CPAP FiO2 (%):  [30 %] 30 % Set Rate:  [14 bmp] 14 bmp Vt Set:  [550 mL] 550 mL PEEP:  [5 cmH20] 5 cmH20 Pressure Support:  [12 cmH20-15 cmH20] 15 cmH20 Plateau Pressure:  [18 cmH20-20 cmH20] 20 cmH20  INTAKE / OUTPUT: I/O last 3 completed shifts: In: [id number] [I.V.:1330; NG/GT:2750; IV Piggyback:150] Out: 5350 [Urine:4950; Stool:400]  PHYSICAL EXAMINATION: Jason: Chronically ill --> interacting more this AM. Neuro: Opens eyes , gestures to his mother and communicates with her HEENT: PERRL, Clean trach site. Cardiovascular: RRR, no MRG Lungs: diffuse coarse BS.  Abdomen: PEG in place, + BS, ostomy Musculoskeletal: Muscle wasting bilaterally. Skin: No rashes. Sacral wound vac in place.   LABS:  BMET  Recent Labs Lab 12/24/15 0500 12/26/15 0907 12/27/15 0528 12/29/15 0945  NA 140  --  136 138  K 4.5  --  4.0 4.3  CL 105  --  105 107  CO2 25  --  21* 21*  BUN 42*  --  54* 69*  CREATININE 1.99* 2.30* 2.11* 1.72*  GLUCOSE 274*  --  331* 241*    Electrolytes  Recent Labs Lab 12/24/15 0500 12/27/15 0528 12/29/15 0945  CALCIUM 11.8* 10.4* 10.2  MG  --  1.7  --   PHOS  --  3.1  --     CBC  Recent Labs Lab 12/26/15 2116 12/27/15 0528 12/29/15 0945  WBC 27.9* 32.0* 38.7*  HGB 7.8* 7.9* 8.2*  HCT 25.7* 26.3* 27.2*  PLT 545* 575* 655*    Coag's No results for input(s): APTT, INR in the last 168 hours.  Sepsis Markers No results for input(s):  LATICACIDVEN, PROCALCITON, O2SATVEN in the last 168 hours.  ABG No results for input(s): PHART, PCO2ART, PO2ART in the last 168 hours.  Liver Enzymes  Recent Labs Lab 12/23/15 0530 12/29/15 0945  AST 15 21  ALT 11* 18  ALKPHOS 104 85  BILITOT 0.3 0.4  ALBUMIN 2.1* 2.4*    Cardiac Enzymes No results for input(s): TROPONINI, PROBNP in the last 168 hours.  Glucose  Recent Labs Lab 12/28/15 1243 12/28/15 1606 12/28/15 2016 12/29/15 0031 12/29/15 0440 12/29/15 0751  GLUCAP 451* 329* 292* 266* 364* 274*    Imaging Dg Chest Port 1 View  12/29/2015  CLINICAL DATA:  Tracheostomy patient, healthcare associated pneumonia old with acute and chronic respiratory failure EXAM: PORTABLE CHEST 1 VIEW COMPARISON:  Portable chest x-ray of December 23, 2015 FINDINGS: The lungs are well-expanded. There has been marked improvement in the appearance of the infiltrate in the right lung. Mild interstitial prominence persists in the mid and lower lung. There is no pleural effusion. The left lung is clear. The heart and pulmonary vascularity are normal. The tracheostomy appliance tip lies at the inferior margin of the clavicular heads which is 5.8 cm above the carina. The right PICC line catheter tip projects over the distal SVC. IMPRESSION: Ongoing improvement of  right-sided pneumonia. There is no significant pleural effusion nor evidence of CHF. Electronically Signed   By: David  Martinique M.D.   On: 12/29/2015 07:27     STUDIES:  CXR 02/28 > bibasilar airspace densities concerning for PNA. CT abdo/ pelvis>>>no fistula CXR 3/7 >>mildly increased bibasilar opacities are noted most consistent with subsegmental atelectasis and associated pleural effusions.  EEG 03/13 > mild to mod diffuse slowing with triphasic waves (could be seen with hypoxic/ischemic injury, toxic/metabolic encephalopathies (hepatic?), med effect). CXR 3/14 >> Consolidation medial right base with small right effusion, left lung  clear  CULTURES: Blood 02/28 >>No growth  Urine 02/28 >>Multiple species, suggest recollection Blood 3/1>> No growth  Sputum 3/6 >>> few pseudomonas >>>MDR Blood Culture 3/6 >> neg Urine Culture 3/7 >>Yeast  MRSA pos screen Blood 03/12 > GPC's > coag neg staph CDiff 3/13 >> Negative  Blood 3/14 >>  ANTIBIOTICS: Vanc 02/28 (7/7) >> 3/7 Levaquin 02/28 (7/7) >>3/7 Tigecycline 3/7 >> 3/9 Aztreonam 3/7 >> 3/9 Colistin 3/9 >> 3/13 Fluconazole 03/9 > Vanc 03/14 > Inhaled amikacin 3/16>>> Cefepime 3/19>>>  SIGNIFICANT EVENTS: 2/28  Admitted with probable HCAP 3/2- no fevers, low secretions, remains vented, refusing Chest PT and other treatments 3/3 improved to TC, pcxr better 3/5- trach collar 24  Hrs 3/6- Tmax 103.2 3/8 still spiking temps  3/9 Colistin started.  3/13: encephalopathic, rising creatinine, hyperkalemic.  3/14 > remains encephalopathic.  1/2 blood cx's with GPC's.  3/16 : unresponsive. Resp pattern worse, agonal. Adding inhaled amikacin, placed back on vent. Began talks w/ mother and father about EOL. They understand he may not survive. Mom not ready to make DNR, but says "she is prepared and won't let trent suffer".   LINES/TUBES: Chronic trach / PEG >> Suprapubic catheter changed 3/8  I reviewed CXR myself, trach in good position, infiltrate noted, improving on the right.  ASSESSMENT / PLAN:  PULMONARY A: Chronic respiratory failure with trach dependence - Now acute on Chronic respiratory failure on 3/16 w/ agonal resp pattern.  Tracheostomy status MDR pseudeomonas PNA -->concerned that his clinical decline reflects the result of [name] stopping treatment for the pseudomonas. We had hoped that this was a colonizer, but his rising WBC, resp distress & worsening CXR and overall clinical decline would reflect otherwise.  P:  PS as able (on 15/5 currently), I do not foresee trent coming off the vent, I believe he is so deconditioned that he will likely need a  ventilator outside the hospital 24/7. See ID section. CXR in AM.  CARDIOVASCULAR A:  Chronic hypotension - on midodrine P:  Continue midodrine at current dose.  Monitor hemodynamics.  INFECTIOUS A:   Bacteremia - 1/2 blood cultures from 03/12 with GPC's.  Also with increasing leukocytosis as of 03/14 UTI - hx pseudomonal UTI in past with resistance to multiple abx - positive for yeast  HCAP-->pseudomonas MDR given clinical decline feel that this is more than a colonizer.  Hx sacral wounds P:   ID following - appreciate the assistance. Abx per ID. WOC following.  Discussed with bedside RN and RT.  Rush Farmer, M.D. [facility] Pulmonary/Critical Care Medicine. Pager: [phone]. After hours pager: [phone].

## 2015-12-29 NOTE — Progress Notes (Signed)
Pharmacy Antibiotic Note  39 YOM with MDR pseudomonas PNA and CoNS bacteremia.  Pharmacy managing Vancomycin and Cefepime.  Patient's allergic reaction to cefuroxime is anaphylaxis; however, cefuroxime and cefepime are not structurally similar.  Mother is aware of risk per MD. Currently tolerating Cefepime with premedications. Wbc up to 38.7. Remains febrile  His renal function has improved today. SCr down 2.11>>1.72, UOP 1.9 mL/kg/hr   Plan: - Vanc 750mg  IV Q48H. Likely will need another level if renal fx continues to improve. F/u Cmet tomorrow  - Monitor renal fxn, clinical progress - Cefepime 1gm IV Q12H.  Premedicate with Solu-Medrol 40mg  IV and Pepcid 20mg  IV 30 min prior to med administration.  PRN albuterol nebs Q4H PRN as already ordered.  Epinephrine IV PRN for anaphylaxis.    Height: 5\' 8"  (172.7 cm) Weight: 119 lb 4.8 oz (54.114 kg) IBW/kg (Calculated) : 68.4  Temp (24hrs), Avg:100.3 F (37.9 C), Min:99.1 F (37.3 C), Max:100.9 F (38.3 C)   Recent Labs Lab 12/23/15 0530 12/24/15 0500 12/25/15 0500 12/26/15 0907 12/26/15 2116 12/27/15 0528 12/29/15 0945  WBC 29.6*  --   --  21.8* 27.9* 32.0* 38.7*  CREATININE 2.26* 1.99*  --  2.30*  --  2.11*  --   VANCORANDOM 35  --  15 17  --   --   --     Estimated Creatinine Clearance: 38.8 mL/min (by C-G formula based on Cr of 2.11).    Allergies  Allergen Reactions  . Cefuroxime Axetil Anaphylaxis    Tolerated cefepime 12/2015 - with Pepcid/Solu-Medrol  . Ertapenem Other (See Comments)    Rash and confusion-->tolerated Imipenem   . Morphine And Related Other (See Comments)    Changed mental status, confusion, headache, visual hallucination  . Penicillins Anaphylaxis and Other (See Comments)    Tolerated Imipenem; no reaction to 7 day course of amoxicillin in 2015 Has patient had a PCN reaction causing immediate rash, facial/tongue/throat swelling, SOB or lightheadedness with hypotension: Yes Has patient had a PCN  reaction causing severe rash involving mucus membranes or skin necrosis: No Has patient had a PCN reaction that required hospitalization Yes Has patient had a PCN reaction occurring within the last 10 years: No If all of the above answers are "NO", then may proceed with Cephalo  . Sulfa Antibiotics Anaphylaxis, Shortness Of Breath and Other (See Comments)  . Tessalon [Benzonatate] Anaphylaxis  . Shellfish Allergy Itching and Other (See Comments)    Took benadryl to alleviate reaction  . Levaquin [Levofloxacin In D5w] Swelling    Hand and forearm swelling  . Nsaids Itching and Other (See Comments)    Risk of bleeding, itching  . Miripirium Rash and Other (See Comments)    Change in mental status    Antimicrobials this admission: Vanc 3/1 >> 3/7, 3/14 >> Levaquin 3/1 >> 3/7 Primaxin x 1 2/28 Aztreonam 3/7 >> 3/9 Tigecycline 3/7 >> 3/9 Colistin 3/9 >> 3/13 Fluc 3/9 >> Amikacin 3/16 >> Cefepime 3/20 >>  Dose adjustments this admission: 3/2 VT = 19 mcg/mL 3/17 0530 VR = 35 on 500mg  q24 (obtained prior to 3rd dose) 3/19 0500 VR = 15, gave vanc 500mg  x1 3/20 0900 VR = 17 mcg/mL >> vanc 750mg  q48 (SCr 2.3)  Microbiology results: 2/28 UCx >> mult species FINAL 2/28 BCx x2>> ngF 3/1 BCx x2>> ngF 3/1 MRSA PCR >> positive 3/6 TA - Pseudomonas 3/6  blood x 2:  NF 3/7  Urine:  > 100K yeast 3/13 C.diff: neg 3/12 blood  cx: CoNS - pending 3/14 blood cx: NGTD 3/19 BCx x2 -    Albertina Parr, PharmD., BCPS Clinical Pharmacist Pager [phone]

## 2015-12-29 NOTE — Progress Notes (Signed)
Dr Sloan Leiter here to see pt. Pt's mother & father both sleeping in room in recliners -brought coffee  Parents woke up able to speak with MD

## 2015-12-29 NOTE — Progress Notes (Addendum)
PATIENT DETAILS Name: Jason [name] Age: 30 y.o. 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 on 2/28 for worsening hypoxia secondary to ventilator associated pneumonia with multidrug resistant Pseudomonas and MRSA. Has required consistent ventilator support during this hospital stay. Infectious disease directing antibiotic therapy-unfortunately patient has multiple allergies to antibiotics.PCCM following to see if patient can be weaned off the ventilator.  Significant Events: 2/28 admit to [facility] by PCCM 3/2 no fevers, low secretions, vented, refusing Chest PT and other treatments 3/3 improved to TC, pcxr better 3/5 trach collar 24 Hrs 3/6 Tmax 103.2 3/8 still spiking temps  3/9 Colistin started  3/13 encephalopathic, rising creatinine, hyperkalemic 3/14 remains encephalopathic - 1/2 blood cx's with GPCs 3/16 unresponsive - resp pattern worse, agonal - added inhaled amikacin, placed back on vent 3/21-transferred to [facility]  Subjective: Sleepy this morning, but awakes, still tremulous-answers some questions appropriately this morning.  Assessment/Plan: Acute on chronic hypoxic respiratory failure/tracheostomy dependent: Secondary to multidrug resistant Pseudomonas pneumonia. PCCM following, and attempting to wean him off the ventilator-but given deconditioning, PCCM feels that he may be vent dependent for the near future. Await PCCM follow-up.  Sepsis secondary to VAP/ MDR Pseudeomonas and MRSA PNA: Still febrile, with worsening leukocytosis. Given multiple allergies, drug resistant bacteria-limited options. Infectious disease following an directing care. Currently on intravenous vancomycin  and cefepime. Although he has potential cephalosporin allergy-he is getting premedicated with Solu-Medrol prior to infusion of cefepime. Await infectious disease follow-up.  Acute renal failure: Likely secondary to prerenal azotemia-setting of sepsis. General supportive care, await labs this morning.  Acute encephalopathy: Likely secondary to sepsis and renal failure. Continue IV antibiotics, mentation seems to be slowly improving.EEG 03/13-without any seizure-like activity, but moderate diffuse slowing. Follow.  Yeast UTI: Completed a course of fluconazole-per infectious disease   ? Seizure: Thought to be secondary to fever, and potential drug reaction from inhaled amikacin. No further seizures since then.EEG 03/13-without any seizure. Monitor and follow closely.  Chronic hypotension: Continue Midodrine-BP stable for now  Anemia: Likely secondary to acute illness on top of chronic disease. Hemoglobin stable, follow trend.  Thrombocytosis: Likely reflecting chronic inflammation-acute phase reactant. Follow.  History of gastroparesis: Likely secondary to diabetes, no vomiting-seem stable at present. Follow  Dysphagia: currently NPO-on peg tube feedings.   Severe protein calorie malnutrition: continue peg tube feeds  Stage IV sacral ulcers/stage III left heel ulcer/Full thickness wound mid back:resent prior to admission, continues to be followed wound care RN. Has a left lower quadrant colostomy in place  Type 1 diabetes: CBGs uncontrolled-but on steroids-we will increase Lantus to 16 units, continue SSI. We will try to allow some permissive hyperglycemia, as pt has hx of rapidly developing hypoglycemia when attempts are made to strictly control his CBG   Chronic pain syndrome: Secondary to extensive sacral ulcers-continue transdermal fentanyl/oxycodone. Agree with Dr. Garen Lah assessment of resuming judicious narcotics and antispasmodic medications to accomplish comfort. Unfortunate  30 year old is deteriorating slowly, and do not wish to see him suffer at this time.  Anxiety: Continue Ativan as needed.  History of neurogenic bladder: Supra pubic catheter in place.  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. IVC filter placed 8/6. Review of prior documentation-not on A/C due to  need for frequent debridements which have caused extensive bleeding, previously on apixaban-on prophylactic Heparin currently   History of spinal cord infarct with paraplegia  Palliative care: Unfortunate 30 year old patient with prior spinal cord injury with resultant paraplegia-chronic tracheostomy-multiple infections in the past with multidrug resistant organisms-multiple allergies to numerous antibiotics-brought into the hospital for worsening hypoxic respiratory failure secondary to ventilator associated pneumonia from multidrug resistant Pseudomonas. Unfortunately given severe deconditioning, not be able to be weaned off the ventilator. Hospital course complicated by worsening sepsis and renal failure in spite of broad spectrum antibiotics. Family has had multiple discussions with critical care MDs, palliative care M.D.-currently he remains a full code. However family is aware of very poor overall prognoses.  Disposition: Remain inpatient  Antimicrobial agents  See below  Anti-infectives    Start     Dose/Rate Route Frequency Ordered Stop   12/26/15 1100  vancomycin (VANCOCIN) IVPB 750 mg/150 ml premix     750 mg 150 mL/hr over 60 Minutes Intravenous Every 48 hours 12/26/15 1014     12/26/15 1100  ceFEPIme (MAXIPIME) 1 g in dextrose 5 % 50 mL IVPB     1 g 100 mL/hr over 30 Minutes Intravenous Every 12 hours 12/26/15 1014     12/25/15 0830  vancomycin (VANCOCIN) 500 mg in sodium chloride 0.9 % 100 mL IVPB     500 mg 100 mL/hr over 60 Minutes Intravenous  Once 12/25/15 0816 12/25/15 1046   12/22/15 1200  amikacin (AMIKIN) injection 500 mg  Status:  Discontinued     500 mg Inhalation 2 times daily  12/22/15 0950 12/27/15 0030   12/21/15 1030  vancomycin (VANCOCIN) 500 mg in sodium chloride 0.9 % 100 mL IVPB  Status:  Discontinued     500 mg 100 mL/hr over 60 Minutes Intravenous Every 24 hours 12/20/15 1156 12/23/15 0954   12/20/15 2300  vancomycin (VANCOCIN) 500 mg in sodium chloride 0.9 % 100 mL IVPB  Status:  Discontinued     500 mg 100 mL/hr over 60 Minutes Intravenous Every 12 hours 12/20/15 1145 12/20/15 1156   12/20/15 0930  vancomycin (VANCOCIN) 1,250 mg in sodium chloride 0.9 % 250 mL IVPB     1,250 mg 166.7 mL/hr over 90 Minutes Intravenous  Once 12/20/15 0858 12/20/15 1211   12/15/15 1300  fluconazole (DIFLUCAN) tablet 100 mg  Status:  Discontinued     100 mg Oral Daily 12/15/15 1211 12/28/15 1129   12/15/15 1230  colistimethate (COLYMYCIN) 137.25 mg in sodium chloride 0.9 % 100 mL IVPB  Status:  Discontinued     5 mg/kg/day  54.9 kg 203.7 mL/hr over 30 Minutes Intravenous Every 12 hours 12/15/15 1211 12/19/15 1147   12/13/15 2200  tigecycline (TYGACIL) 50 mg in sodium chloride 0.9 % 100 mL IVPB  Status:  Discontinued     50 mg 200 mL/hr over 30 Minutes Intravenous Every 12 hours 12/13/15 0856 12/15/15 1211   12/13/15 1000  tigecycline (TYGACIL) 100 mg in sodium chloride 0.9 % 100 mL IVPB     100 mg 200 mL/hr over 30 Minutes Intravenous  Once 12/13/15 0856 12/13/15 1117   12/13/15 1000  aztreonam (AZACTAM) 2 g in dextrose 5 % 50 mL IVPB  Status:  Discontinued     2 g 100 mL/hr over 30 Minutes Intravenous 3 times per day 12/13/15 0856 12/15/15 1211   12/07/15 0600  imipenem-cilastatin (PRIMAXIN) 500 mg in sodium chloride 0.9 % 100 mL IVPB  Status:  Discontinued  500 mg 200 mL/hr over 30 Minutes Intravenous 4 times per day 12/06/15 2311 12/06/15 2329   12/07/15 0000  levofloxacin (LEVAQUIN) IVPB 750 mg  Status:  Discontinued     750 mg 100 mL/hr over 90 Minutes Intravenous Every 24 hours 12/06/15 2339 12/13/15 0856   12/06/15 2315  vancomycin (VANCOCIN) 500 mg in  sodium chloride 0.9 % 100 mL IVPB  Status:  Discontinued     500 mg 100 mL/hr over 60 Minutes Intravenous Every 12 hours 12/06/15 2311 12/13/15 0856   12/06/15 2230  imipenem-cilastatin (PRIMAXIN) 500 mg in sodium chloride 0.9 % 100 mL IVPB     500 mg 200 mL/hr over 30 Minutes Intravenous  Once 12/06/15 2209 12/06/15 2335      DVT Prophylaxis: Prophylactic  Heparin   Code Status: Full code   Family Communication Mother/Father at bedside  Procedures: Chronic trach / PEG >> Suprapubic catheter changed 3/8  CONSULTS:  pulmonary/intensive care, ID and Palliative care  Time spent 40 minutes-Greater than 50% of this time was spent in counseling, explanation of diagnosis, planning of further management, and coordination of care.  MEDICATIONS: Scheduled Meds: . acetaminophen (TYLENOL) oral liquid 160 mg/5 mL  500 mg Per Tube TID  . antiseptic oral rinse  7 mL Mouth Rinse QID  . baclofen  5 mg Per Tube QID  . buPROPion  150 mg Oral Daily  . ceFEPime (MAXIPIME) IV  1 g Intravenous Q12H  . chlorhexidine gluconate  15 mL Mouth Rinse BID  . famotidine (PEPCID) IV  20 mg Intravenous Q12H  . fentaNYL  50 mcg Transdermal Q72H  . free water  200 mL Per Tube Q6H  . heparin  5,000 Units Subcutaneous 3 times per day  . insulin aspart  0-20 Units Subcutaneous 6 times per day  . insulin glargine  12 Units Subcutaneous Daily  . LORazepam  0.5 mg Per Tube TID  . methylPREDNISolone (SOLU-MEDROL) injection  20 mg Intravenous Q12H  . midodrine  5 mg Per Tube TID AC  . mupirocin cream   Topical Daily  . naLOXone (NARCAN)  injection  0.4 mg Intramuscular Once  . vancomycin  750 mg Intravenous Q48H   Continuous Infusions: . sodium chloride 75 mL/hr at 12/28/15 0953  . feeding supplement (GLUCERNA 1.2 CAL) 1,000 mL (12/28/15 2124)   PRN Meds:.acetaminophen, albuterol, dextrose, EPINEPHrine, hydrALAZINE, LORazepam, oxyCODONE, sodium chloride flush    PHYSICAL EXAM: Vital signs in last 24  hours: Filed Vitals:   12/29/15 0500 12/29/15 0731 12/29/15 0753 12/29/15 0800  BP:  121/82 120/78 118/82  Pulse:  91 74 93  Temp: 99.9 F (37.7 C)  100.5 F (38.1 C)   TempSrc: Oral  Axillary   Resp:  18 24 33  Height:      Weight: 54.114 kg (119 lb 4.8 oz)     SpO2:  100% 100% 100%    Weight change: 0.363 kg (12.8 oz) Filed Weights   12/27/15 0400 12/28/15 0500 12/29/15 0500  Weight: 56.9 kg (125 lb 7.1 oz) 53.751 kg (118 lb 8 oz) 54.114 kg (119 lb 4.8 oz)   Body mass index is 18.14 kg/(m^2).   Gen Exam: Awakes-tremulous-follows some commands. Does not appear to be in distress  Neck: Supple   Chest: Some rales on the right side-but mostly clear to auscultation anteriorly.  CVS: S1 S2 Regular  Abdomen: soft, BS +, non tender, PEG tube in place, suprapubic catheter in place. Left lower quadrant colostomy in place Extremities: no  edema, lower extremities warm to touch.   Intake/Output from previous day:  Intake/Output Summary (Last 24 hours) at 12/29/15 1020 Last data filed at 12/29/15 0900  Gross per 24 hour  Intake   2025 ml  Output   3100 ml  Net  -1075 ml     LAB RESULTS: CBC  Recent Labs Lab 12/23/15 0530 12/26/15 0907 12/26/15 2116 12/27/15 0528 12/29/15 0945  WBC 29.6* 21.8* 27.9* 32.0* 38.7*  HGB 7.4* 6.5* 7.8* 7.9* 8.2*  HCT 26.4* 21.9* 25.7* 26.3* 27.2*  PLT 749* 582* 545* 575* 655*  MCV 84.1 81.1 80.3 80.7 82.2  MCH 23.6* 24.1* 24.4* 24.2* 24.8*  MCHC 28.0* 29.7* 30.4 30.0 30.1  RDW 16.7* 16.4* 15.9* 16.2* 17.0*    Chemistries   Recent Labs Lab 12/23/15 0530 12/24/15 0500 12/26/15 0907 12/27/15 0528  NA 143 140  --  136  K 4.9 4.5  --  4.0  CL 108 105  --  105  CO2 24 25  --  21*  GLUCOSE 433* 274*  --  331*  BUN 45* 42*  --  54*  CREATININE 2.26* 1.99* 2.30* 2.11*  CALCIUM 12.1* 11.8*  --  10.4*  MG  --   --   --  1.7    CBG:  Recent Labs Lab 12/28/15 1606 12/28/15 2016 12/29/15 0031 12/29/15 0440 12/29/15 0751    GLUCAP 329* 292* 266* 364* 274*    GFR Estimated Creatinine Clearance: 38.8 mL/min (by C-G formula based on Cr of 2.11).  Coagulation profile No results for input(s): INR, PROTIME in the last 168 hours.  Cardiac Enzymes No results for input(s): CKMB, TROPONINI, MYOGLOBIN in the last 168 hours.  Invalid input(s): CK  Invalid input(s): POCBNP No results for input(s): DDIMER in the last 72 hours. No results for input(s): HGBA1C in the last 72 hours. No results for input(s): CHOL, HDL, LDLCALC, TRIG, CHOLHDL, LDLDIRECT in the last 72 hours. No results for input(s): TSH, T4TOTAL, T3FREE, THYROIDAB in the last 72 hours.  Invalid input(s): FREET3 No results for input(s): VITAMINB12, FOLATE, FERRITIN, TIBC, IRON, RETICCTPCT in the last 72 hours. No results for input(s): LIPASE, AMYLASE in the last 72 hours.  Urine Studies No results for input(s): UHGB, CRYS in the last 72 hours.  Invalid input(s): UACOL, UAPR, USPG, UPH, UTP, UGL, UKET, UBIL, UNIT, UROB, ULEU, UEPI, UWBC, URBC, UBAC, CAST, UCOM, BILUA  MICROBIOLOGY: Recent Results (from the past 240 hour(s))  C difficile quick scan w PCR reflex     Status: None   Collection Time: 12/19/15  2:00 PM  Result Value Ref Range Status   C Diff antigen NEGATIVE NEGATIVE Final   C Diff toxin NEGATIVE NEGATIVE Final   C Diff interpretation Negative for toxigenic C. difficile  Final  Culture, blood (routine x 2)     Status: None   Collection Time: 12/20/15  3:20 PM  Result Value Ref Range Status   Specimen Description BLOOD LEFT ANTECUBITAL  Final   Special Requests BOTTLES DRAWN AEROBIC AND ANAEROBIC 5CC  Final   Culture NO GROWTH 5 DAYS  Final   Report Status 12/25/2015 FINAL  Final  Culture, blood (routine x 2)     Status: None   Collection Time: 12/20/15  3:29 PM  Result Value Ref Range Status   Specimen Description BLOOD LEFT HAND  Final   Special Requests IN PEDIATRIC BOTTLE 3CC  Final   Culture NO GROWTH 5 DAYS  Final   Report  Status 12/25/2015  FINAL  Final  Culture, respiratory (NON-Expectorated)     Status: None   Collection Time: 12/24/15  1:45 PM  Result Value Ref Range Status   Specimen Description TRACHEAL ASPIRATE  Final   Special Requests Normal  Final   Gram Stain   Final    ABUNDANT WBC PRESENT, PREDOMINANTLY PMN FEW SQUAMOUS EPITHELIAL CELLS PRESENT NO ORGANISMS SEEN THIS SPECIMEN IS ACCEPTABLE FOR SPUTUM CULTURE Performed at [facility]    Culture   Final    RARE METHICILLIN RESISTANT STAPHYLOCOCCUS AUREUS 22 Note: RIFAMPIN AND GENTAMICIN SHOULD NOT BE USED AS SINGLE DRUGS FOR TREATMENT OF STAPH INFECTIONS. CRITICAL RESULT CALLED TO, READ BACK BY AND VERIFIED WITH: TROYCE H. 3 17 @ 0821 BY PARDA Performed at [facility]    Report Status 12/28/2015 FINAL  Final   Organism ID, Bacteria METHICILLIN RESISTANT STAPHYLOCOCCUS AUREUS  Final      Susceptibility   Methicillin resistant staphylococcus aureus - MIC*    CLINDAMYCIN >=8 RESISTANT Resistant     ERYTHROMYCIN >=8 RESISTANT Resistant     GENTAMICIN <=0.5 SENSITIVE Sensitive     LEVOFLOXACIN >=8 RESISTANT Resistant     OXACILLIN >=4 RESISTANT Resistant     RIFAMPIN <=0.5 SENSITIVE Sensitive     TRIMETH/SULFA >=320 RESISTANT Resistant     VANCOMYCIN 1 SENSITIVE Sensitive     TETRACYCLINE >=16 RESISTANT Resistant     * RARE METHICILLIN RESISTANT STAPHYLOCOCCUS AUREUS  Culture, blood (routine x 2)     Status: None (Preliminary result)   Collection Time: 12/25/15  6:46 PM  Result Value Ref Range Status   Specimen Description BLOOD LEFT ARM  Final   Special Requests IN PEDIATRIC BOTTLE 3CC  Final   Culture NO GROWTH 3 DAYS  Final   Report Status PENDING  Incomplete  Culture, blood (routine x 2)     Status: None (Preliminary result)   Collection Time: 12/25/15  6:52 PM  Result Value Ref Range Status   Specimen Description BLOOD LEFT ARM  Final   Special Requests IN PEDIATRIC BOTTLE 3CC  Final   Culture NO GROWTH 3 DAYS   Final   Report Status PENDING  Incomplete    RADIOLOGY STUDIES/RESULTS: Ct Abdomen Pelvis Wo Contrast  12/10/2015  CLINICAL DATA:  Paralyzed from accident. Rule out fistula from bladder to ostomy. Severe case of MRSA. Decubitus ulcers to back and buttocks. Oral contrast only due to inadequate IV access. EXAM: CT ABDOMEN AND PELVIS WITHOUT CONTRAST TECHNIQUE: Multidetector CT imaging of the abdomen and pelvis was performed following the standard protocol without IV contrast. COMPARISON:  None. FINDINGS: Lower chest: There are bibasilar consolidations which could represent atelectasis or pneumonia. There are also small bilateral pleural effusions. Hepatobiliary: No mass visualized on this un-enhanced exam. Pancreas: No mass or inflammatory process identified on this un-enhanced exam. Spleen: Within normal limits in size. Adrenals/Urinary Tract: Bladder walls appear thickened. Suprapubic catheter in place without obvious complicating feature. Kidneys are unremarkable without stone or hydronephrosis. Stomach/Bowel: Gastrostomy tube in place. Soft tissue defect of the left lower abdominal wall, presumed colostomy. No large or small bowel dilatation. Vascular/Lymphatic: No pathologically enlarged lymph nodes. No evidence of abdominal aortic aneurysm. IVC filter in place, well-positioned just below the level of the renal veins. Reproductive: No mass or other significant abnormality. Other: Small amount of free fluid within the right paracolic gutter and left lower quadrant. No abscess collection identified within the abdomen or pelvis. No free intraperitoneal air. No obvious fistula between the bladder  and left lower abdominal ostomy site. Musculoskeletal: Ill-defined edema throughout the subcutaneous soft tissues suggesting anasarca. Soft tissue defects within the bilateral buttocks region, compatible with the given history of decubitus ulcers. Additional large soft tissue defect overlying the sacrum, again  compatible with the given history of decubitus ulcer. Underlying the decubitus ulcer sites, there are fairly well-defined cortical defects within the posterior right iliac bone and posterior sacrum. IMPRESSION: 1. Circumferential thickening of the walls of the bladder suspicious for cystitis. Recommend correlation with urinalysis. Suprapubic catheter in place. 2. Left lower abdominal wall defect, presumed colostomy. No fistulous connection identified between the bladder and this presumed colostomy. If clinical concern for fistula persists, could consider CT cystogram by injecting contrast into the bladder through the indwelling suprapubic catheter. 3. Small amount of ascites. No intra-abdominal or intrapelvic abscess collection seen. No free intraperitoneal air. 4. Decubitus ulcers of the bilateral buttocks regions and overlying the sacrum. 5. Cortical defects along the posterior margins of the right iliac bone and posterior sacrum, somewhat well-defined, immediately underlying the sites of the decubitus ulcers. I suspect that these are chronic osseous erosions related to the overlying decubitus ulcers. Alternatively, these could be postsurgical changes performed in conjunction with decubitus ulcer debridements if surgical history correlates. Destructive changes of osteomyelitis is certainly an additional possibility. MRI may be helpful for further characterization. 6. Bibasilar consolidations, atelectasis versus pneumonia, with small adjacent pleural effusions. These results will be called to the ordering clinician or representative by the Radiologist Assistant, and communication documented in the PACS or zVision Dashboard. Electronically Signed   By: Franki Cabot M.D.   On: 12/10/2015 18:50   Dg Chest Port 1 View  12/29/2015  CLINICAL DATA:  Tracheostomy patient, healthcare associated pneumonia old with acute and chronic respiratory failure EXAM: PORTABLE CHEST 1 VIEW COMPARISON:  Portable chest x-ray of December 23, 2015 FINDINGS: The lungs are well-expanded. There has been marked improvement in the appearance of the infiltrate in the right lung. Mild interstitial prominence persists in the mid and lower lung. There is no pleural effusion. The left lung is clear. The heart and pulmonary vascularity are normal. The tracheostomy appliance tip lies at the inferior margin of the clavicular heads which is 5.8 cm above the carina. The right PICC line catheter tip projects over the distal SVC. IMPRESSION: Ongoing improvement of right-sided pneumonia. There is no significant pleural effusion nor evidence of CHF. Electronically Signed   By: David  Martinique M.D.   On: 12/29/2015 07:27   Portable Chest Xray  12/23/2015  CLINICAL DATA:  Pneumonia EXAM: PORTABLE CHEST 1 VIEW COMPARISON:  12/22/2015 FINDINGS: Right PICC and tracheostomy tube are stable. Bilateral airspace disease is stable. There is partial collapse of the right lower lobe which is stable. No pneumothorax. IMPRESSION: Stable bilateral airspace disease. Stable partial collapse of the right lower lobe. Electronically Signed   By: Marybelle Killings M.D.   On: 12/23/2015 08:14   Dg Chest Port 1 View  12/22/2015  CLINICAL DATA:  Dyspnea.  Quadriplegia. EXAM: PORTABLE CHEST 1 VIEW COMPARISON:  12/20/2015 FINDINGS: Support apparatus stable. Increased right perihilar and basilar airspace opacity. The patient is rotated to the right on today's radiograph, reducing diagnostic sensitivity and specificity. Deformity of the right distal clavicle, stable. Improved aeration at the left lung base. IMPRESSION: 1. Significant worsening of airspace opacity in the right mid lung and right lung base, nonspecific but potentially from pneumonia. However, there is some improvement in the airspace opacity at the left lung  base. Electronically Signed   By: Van Clines M.D.   On: 12/22/2015 09:38   Dg Chest Port 1 View  12/20/2015  CLINICAL DATA:  Respiratory failure EXAM: PORTABLE CHEST  1 VIEW COMPARISON:  December 15, 2015 FINDINGS: Tracheostomy catheter tip is 4.8 cm above the carina. Central catheter tip is in the superior vena cava near the cavoatrial junction. There is no appreciable pneumothorax. There is airspace consolidation in the medial right base with small right effusion. Left lung is clear. Heart is upper normal in size with pulmonary vascularity within normal limits. No adenopathy. There is lumbar levoscoliosis. IMPRESSION: Tube and catheter positions as described without pneumothorax. Consolidation medial right base with small right effusion. Left lung clear. No change in cardiac silhouette. Electronically Signed   By: Lowella Grip III M.D.   On: 12/20/2015 07:37   Dg Chest Port 1 View  12/15/2015  CLINICAL DATA:  Followup exam.  Pneumonia. EXAM: PORTABLE CHEST 1 VIEW COMPARISON:  12/13/2015 FINDINGS: Persistent lung base opacity noted consistent with combination of pleural fluid and atelectasis. Pneumonia not excluded. No evidence of pulmonary edema. No pneumothorax. Heart, mediastinum hila are unremarkable. Tracheostomy tube is stable in well positioned. IMPRESSION: 1. No change from the previous exam. 2. Persistent lower lung zone opacity consistent with a combination pleural fluid and probable atelectasis. Pneumonia is possible. Electronically Signed   By: Lajean Manes M.D.   On: 12/15/2015 10:10   Dg Chest Port 1 View  12/13/2015  CLINICAL DATA:  Pneumonia. EXAM: PORTABLE CHEST 1 VIEW COMPARISON:  December 09, 2015. FINDINGS: Stable cardiomediastinal silhouette. No pneumothorax is noted. Mildly increased bilateral pleural effusions are noted with probable associated atelectasis. Tracheostomy tube is unchanged in position. Bony thorax is unremarkable. IMPRESSION: Mildly increased bibasilar opacities are noted most consistent with subsegmental atelectasis and associated pleural effusions. Electronically Signed   By: Marijo Conception, M.D.   On: 12/13/2015 10:16   Dg Chest Port  1 View  12/09/2015  CLINICAL DATA:  Pneumonia. EXAM: PORTABLE CHEST 1 VIEW COMPARISON:  12/08/2015. FINDINGS: Tracheostomy to in stable position. Mediastinum hilar structures normal. Low lung volumes with persistent bibasilar atelectasis and/or infiltrates again noted. Small pleural effusions again noted. No pneumothorax. IMPRESSION: 1. Tracheostomy tube in stable position. 2. Low lung volumes with persistent bibasilar atelectasis and/or infiltrates. Persistent bilateral pleural effusions. Similar findings noted on prior exam. Electronically Signed   By: [place]   On: 12/09/2015 07:20   Dg Chest Port 1 View  12/08/2015  CLINICAL DATA:  Acute respiratory failure. EXAM: PORTABLE CHEST 1 VIEW COMPARISON:  12/07/2015. FINDINGS: Tracheostomy tube in stable position. Heart size stable. Persistent bibasilar atelectasis and/or infiltrates. Small left pleural effusion. No pneumothorax. Right costophrenic angle not imaged. IMPRESSION: 1. Tracheostomy tube in stable position. 2. Persistent bibasilar atelectasis and/or infiltrates without interim clearing. Small left pleural effusion again noted. Electronically Signed   By: Marcello Moores  Register   On: 12/08/2015 07:09   Dg Chest Port 1 View  12/07/2015  CLINICAL DATA:  Acute onset of respiratory failure. Initial encounter. EXAM: PORTABLE CHEST 1 VIEW COMPARISON:  Chest radiograph performed 12/06/2015 FINDINGS: The patient's tracheostomy tube is seen ending 5 cm above the carina. Bibasilar airspace opacities may reflect pneumonia or pulmonary edema. Small bilateral pleural effusions are noted. No pneumothorax is seen. The cardiomediastinal silhouette remains normal in size. No acute osseous abnormalities are identified. IMPRESSION: Bibasilar airspace opacities may reflect pneumonia or pulmonary edema. Small bilateral pleural effusions noted. Electronically Signed   By:  Garald Balding M.D.   On: 12/07/2015 06:43   Dg Chest Portable 1 View  12/06/2015  CLINICAL DATA:   30 year old male with sepsis and decreased O2 sats. 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 There are bibasilar opacities with silhouetting of the diaphragm, concerning for pneumonia. There has been interval increase in the left lung base density compared to the prior study. Small bilateral pleural effusions may be present. The cardiac silhouette is within normal limits. No acute osseous pathology. IMPRESSION: Bibasilar airspace densities concerning for pneumonia. Clinical correlation and follow-up recommended. Electronically Signed   By: Anner Crete M.D.   On: 12/06/2015 19:59   Dg Abd Portable 1v  12/18/2015  CLINICAL DATA:  Acute onset of abdominal distention. Initial encounter. EXAM: PORTABLE ABDOMEN - 1 VIEW COMPARISON:  CT of the abdomen and pelvis from 12/10/2015 FINDINGS: The visualized bowel gas pattern is unremarkable. Scattered air and stool filled loops of colon are seen; no abnormal dilatation of small bowel loops is seen to suggest small bowel obstruction. No free intra-abdominal air is identified, though evaluation for free air is limited on a single supine view. The stomach is partially filled with air. An IVC filter is noted. The visualized osseous structures are within normal limits; the sacroiliac joints are unremarkable in appearance. The visualized lung bases are essentially clear. IMPRESSION: Unremarkable bowel gas pattern; no free intra-abdominal air seen. Moderate amount of stool noted in the colon. Stomach partially filled with air. Electronically Signed   By: Garald Balding M.D.   On: 12/18/2015 21:10    Oren Binet, MD  [facility] Pager:[phone]  If 7PM-7AM, please contact night-coverage [email or web address] Password [email or web address] 12/29/2015, 10:20 AM   LOS: 23 days

## 2015-12-29 NOTE — Progress Notes (Signed)
[facility]   Reason for visit: Follow up on VAP  Interval History: afebrile, on cefepime with premedication and no issues.  Some mild improvement. WBC up but also on premedication with steroids.  Much more alert now.  Weaning off vent a little better today  Physical Exam: Constitutional:  Filed Vitals:   12/29/15 0800 12/29/15 1209  BP: 118/82 137/92  Pulse: 93 95  Temp:    Resp: 33 28   patient appears in NAD,opens eyes, interactive Eyes: anicteric HENT: + trach Respiratory: on vent Cardiovascular: Tachy RR GI: soft, nt, nd Skin: no rashes  Review of Systems: Unable to be assessed due to mental status  Lab Results  Component Value Date   WBC 38.7* 12/29/2015   HGB 8.2* 12/29/2015   HCT 27.2* 12/29/2015   MCV 82.2 12/29/2015   PLT 655* 12/29/2015    Lab Results  Component Value Date   CREATININE 1.72* 12/29/2015   BUN 69* 12/29/2015   NA 138 12/29/2015   K 4.3 12/29/2015   CL 107 12/29/2015   CO2 21* 12/29/2015    Lab Results  Component Value Date   ALT 18 12/29/2015   AST 21 12/29/2015   ALKPHOS 85 12/29/2015     Microbiology: Recent Results (from the past 240 hour(s))  C difficile quick scan w PCR reflex     Status: None   Collection Time: 12/19/15  2:00 PM  Result Value Ref Range Status   C Diff antigen NEGATIVE NEGATIVE Final   C Diff toxin NEGATIVE NEGATIVE Final   C Diff interpretation Negative for toxigenic C. difficile  Final  Culture, blood (routine x 2)     Status: None   Collection Time: 12/20/15  3:20 PM  Result Value Ref Range Status   Specimen Description BLOOD LEFT ANTECUBITAL  Final   Special Requests BOTTLES DRAWN AEROBIC AND ANAEROBIC 5CC  Final   Culture NO GROWTH 5 DAYS  Final   Report Status 12/25/2015 FINAL  Final  Culture, blood (routine x 2)     Status: None   Collection Time: 12/20/15  3:29 PM  Result Value Ref Range Status   Specimen Description BLOOD LEFT HAND  Final   Special Requests IN  PEDIATRIC BOTTLE 3CC  Final   Culture NO GROWTH 5 DAYS  Final   Report Status 12/25/2015 FINAL  Final  Culture, respiratory (NON-Expectorated)     Status: None   Collection Time: 12/24/15  1:45 PM  Result Value Ref Range Status   Specimen Description TRACHEAL ASPIRATE  Final   Special Requests Normal  Final   Gram Stain   Final    ABUNDANT WBC PRESENT, PREDOMINANTLY PMN FEW SQUAMOUS EPITHELIAL CELLS PRESENT NO ORGANISMS SEEN THIS SPECIMEN IS ACCEPTABLE FOR SPUTUM CULTURE Performed at [facility]    Culture   Final    RARE METHICILLIN RESISTANT STAPHYLOCOCCUS AUREUS 22 Note: RIFAMPIN AND GENTAMICIN SHOULD NOT BE USED AS SINGLE DRUGS FOR TREATMENT OF STAPH INFECTIONS. CRITICAL RESULT CALLED TO, READ BACK BY AND VERIFIED WITH: TROYCE H. 3 17 @ 0821 BY PARDA Performed at [facility]    Report Status 12/28/2015 FINAL  Final   Organism ID, Bacteria METHICILLIN RESISTANT STAPHYLOCOCCUS AUREUS  Final      Susceptibility   Methicillin resistant staphylococcus aureus - MIC*    CLINDAMYCIN >=8 RESISTANT Resistant     ERYTHROMYCIN >=8 RESISTANT Resistant     GENTAMICIN <=0.5 SENSITIVE Sensitive     LEVOFLOXACIN >=  8 RESISTANT Resistant     OXACILLIN >=4 RESISTANT Resistant     RIFAMPIN <=0.5 SENSITIVE Sensitive     TRIMETH/SULFA >=320 RESISTANT Resistant     VANCOMYCIN 1 SENSITIVE Sensitive     TETRACYCLINE >=16 RESISTANT Resistant     * RARE METHICILLIN RESISTANT STAPHYLOCOCCUS AUREUS  Culture, blood (routine x 2)     Status: None (Preliminary result)   Collection Time: 12/25/15  6:46 PM  Result Value Ref Range Status   Specimen Description BLOOD LEFT ARM  Final   Special Requests IN PEDIATRIC BOTTLE 3CC  Final   Culture NO GROWTH 4 DAYS  Final   Report Status PENDING  Incomplete  Culture, blood (routine x 2)     Status: None (Preliminary result)   Collection Time: 12/25/15  6:52 PM  Result Value Ref Range Status   Specimen Description BLOOD LEFT ARM  Final    Special Requests IN PEDIATRIC BOTTLE 3CC  Final   Culture NO GROWTH 4 DAYS  Final   Report Status PENDING  Incomplete    Impression/Plan:  1. VAP - MDR Pseudomonas.  On cefepime. Will continue.   2. Chronic respiratory failure - multiple infections in the past.  Trying to wean.    3. Leukocytosis - worsening status as well as steroids.   Discussed with mother at bedside.

## 2015-12-30 DIAGNOSIS — Z9911 Dependence on respirator [ventilator] status: Secondary | ICD-10-CM | POA: Insufficient documentation

## 2015-12-30 LAB — COMPREHENSIVE METABOLIC PANEL WITH GFR
ALT: 18 U/L (ref 17–63)
AST: 18 U/L (ref 15–41)
Albumin: 2.4 g/dL — ABNORMAL LOW (ref 3.5–5.0)
Alkaline Phosphatase: 98 U/L (ref 38–126)
Anion gap: 10 (ref 5–15)
BUN: 67 mg/dL — ABNORMAL HIGH (ref 6–20)
CO2: 19 mmol/L — ABNORMAL LOW (ref 22–32)
Calcium: 9.8 mg/dL (ref 8.9–10.3)
Chloride: 103 mmol/L (ref 101–111)
Creatinine, Ser: 1.61 mg/dL — ABNORMAL HIGH (ref 0.61–1.24)
GFR calc Af Amer: >60 mL/min
GFR calc non Af Amer: 56 mL/min — ABNORMAL LOW
Glucose, Bld: 453 mg/dL — ABNORMAL HIGH (ref 65–99)
Potassium: 5.4 mmol/L — ABNORMAL HIGH (ref 3.5–5.1)
Sodium: 132 mmol/L — ABNORMAL LOW (ref 135–145)
Total Bilirubin: 0.5 mg/dL (ref 0.3–1.2)
Total Protein: 7.4 g/dL (ref 6.5–8.1)

## 2015-12-30 LAB — GLUCOSE, CAPILLARY
GLUCOSE-CAPILLARY: 212 mg/dL — AB (ref 65–99)
GLUCOSE-CAPILLARY: 168 mg/dL — AB (ref 65–99)
Glucose-Capillary: 137 mg/dL — ABNORMAL HIGH (ref 65–99)
Glucose-Capillary: 230 mg/dL — ABNORMAL HIGH (ref 65–99)
Glucose-Capillary: 298 mg/dL — ABNORMAL HIGH (ref 65–99)
Glucose-Capillary: 315 mg/dL — ABNORMAL HIGH (ref 65–99)
Glucose-Capillary: 386 mg/dL — ABNORMAL HIGH (ref 65–99)
Glucose-Capillary: 413 mg/dL — ABNORMAL HIGH (ref 65–99)

## 2015-12-30 LAB — CBC WITH DIFFERENTIAL/PLATELET
BASOS ABS: 0 10*3/uL (ref 0.0–0.1)
Basophils Relative: 0 %
EOS ABS: 0 10*3/uL (ref 0.0–0.7)
Eosinophils Relative: 0 %
HEMATOCRIT: 28.9 % — AB (ref 39.0–52.0)
HEMOGLOBIN: 8.5 g/dL — AB (ref 13.0–17.0)
LYMPHS ABS: 1.7 10*3/uL (ref 0.7–4.0)
LYMPHS PCT: 5 %
MCH: 24.1 pg — ABNORMAL LOW (ref 26.0–34.0)
MCHC: 29.4 g/dL — AB (ref 30.0–36.0)
MCV: 81.9 fL (ref 78.0–100.0)
MONOS PCT: 4 %
Monocytes Absolute: 1.3 10*3/uL — ABNORMAL HIGH (ref 0.1–1.0)
NEUTROS ABS: 30.2 10*3/uL — AB (ref 1.7–7.7)
Neutrophils Relative %: 91 %
Platelets: 725 10*3/uL — ABNORMAL HIGH (ref 150–400)
RBC: 3.53 MIL/uL — AB (ref 4.22–5.81)
RDW: 17.3 % — AB (ref 11.5–15.5)
WBC: 33.2 10*3/uL — AB (ref 4.0–10.5)

## 2015-12-30 LAB — CULTURE, BLOOD (ROUTINE X 2)
CULTURE: NO GROWTH
CULTURE: NO GROWTH

## 2015-12-30 LAB — VANCOMYCIN, TROUGH: VANCOMYCIN TR: 15 ug/mL (ref 10.0–20.0)

## 2015-12-30 MED ORDER — SODIUM POLYSTYRENE SULFONATE 15 GM/60ML PO SUSP
30.0000 g | Freq: Once | ORAL | Status: AC
  Administered 2015-12-30: 30 g
  Filled 2015-12-30: qty 120

## 2015-12-30 NOTE — Progress Notes (Signed)
PATIENT DETAILS Name: Jason [name] Age: 30 y.o. 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 Continues toventilator support during this hospital stay-PCCM attempting to wean him off the ventilator. Infectious disease directing antibiotic therapy-unfortunately patient has multiple allergies to antibiotics.  Significant Events: 2/28 admit to [facility] by PCCM 3/2 no fevers, low secretions, vented, refusing Chest PT and other treatments 3/3 improved to TC, pcxr better 3/5 trach collar 24 Hrs 3/6 Tmax 103.2 3/8 still spiking temps  3/9 Colistin started  3/13 encephalopathic, rising creatinine, hyperkalemic 3/14 remains encephalopathic - 1/2 blood cx's with GPCs 3/16 unresponsive - resp pattern worse, agonal - added inhaled amikacin, placed back on vent 3/21-transferred to [facility]  Subjective: Much more awake and alert this am  Assessment/Plan: Acute on chronic hypoxic respiratory failure/tracheostomy dependent: Secondary to multidrug resistant Pseudomonas pneumonia and ?MRSA. PCCM following, and attempting to wean him off the ventilator-but given deconditioning, PCCM feels that he may be vent dependent for the near future. Await PCCM follow-up.  Sepsis secondary to VAP/ MDR Pseudeomonas and MRSA VOJ:JKKXF curve better, leukocytosis has started to downtrend. Given multiple allergies, drug resistant bacteria-limited options. Infectious disease following an directing care. Currently on intravenous vancomycin and cefepime. Although he has potential cephalosporin allergy-he is getting  premedicated with Solu-Medrol prior to infusion of cefepime. Await infectious disease follow-up.  Acute renal failure: Likely secondary to prerenal azotemia-setting of sepsis. Improving with supportive care. Follow periodically  Acute encephalopathy: Likely secondary to sepsis and renal failure. Continue IV antibiotics, mentation seems to be slowly improving-much more awake and alert.EEG 03/13-without any seizure-like activity, but moderate diffuse slowing. Follow.  Yeast UTI: Completed a course of fluconazole-per infectious disease   ? Seizure: Thought to be secondary to fever, and potential drug reaction from inhaled amikacin. No further seizures since then.EEG 03/13-without any seizure. Monitor and follow closely.  Chronic hypotension: Continue Midodrine-BP stable for now  Anemia: Likely secondary to acute illness on top of chronic disease. Hemoglobin stable, follow trend.  Thrombocytosis: Likely reflecting chronic inflammation-acute phase reactant. Follow.  History of gastroparesis: Likely secondary to diabetes, no vomiting-seem stable at present. Follow  Dysphagia: currently NPO-on peg tube feedings.   Severe protein calorie malnutrition: continue peg tube feeds  Stage IV sacral ulcers/stage III left heel ulcer/Full thickness wound mid back:resent prior to admission, continues to be followed wound care RN. Has a left lower quadrant colostomy in place  Type 1 diabetes: CBGs uncontrolled-but on steroids-we will increase Lantus to 16 units, continue SSI. We will try to allow some permissive hyperglycemia, as pt has hx of rapidly developing hypoglycemia when attempts are made to strictly control his CBG   Chronic pain syndrome: Secondary to extensive sacral ulcers-continue transdermal fentanyl/oxycodone. Agree with Dr. Garen Lah assessment of resuming judicious narcotics and antispasmodic medications to accomplish comfort. Unfortunate 30 year old is deteriorating slowly, and do not wish to  see him suffer at this time.  Anxiety: Continue Ativan as needed.  History of neurogenic bladder: Supra pubic catheter in place.  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. IVC filter placed 8/6. Review of prior documentation-not on A/C due to need for frequent debridements  which have caused extensive bleeding, previously on apixaban-on prophylactic Heparin currently   History of spinal cord infarct with paraplegia  Palliative care: Unfortunate 30 year old patient with prior spinal cord injury with resultant paraplegia-chronic tracheostomy-multiple infections in the past with multidrug resistant organisms-multiple allergies to numerous antibiotics-brought into the hospital for worsening hypoxic respiratory failure secondary to ventilator associated pneumonia from multidrug resistant Pseudomonas. Unfortunately given severe deconditioning, not be able to be weaned off the ventilator. Hospital course complicated by worsening sepsis and renal failure in spite of broad spectrum antibiotics. Family has had multiple discussions with critical care MDs, palliative care M.D.-currently he remains a full code. However family is aware of very poor overall prognoses.  Disposition: Remain inpatient  Antimicrobial agents  See below  Anti-infectives    Start     Dose/Rate Route Frequency Ordered Stop   12/26/15 1100  vancomycin (VANCOCIN) IVPB 750 mg/150 ml premix     750 mg 150 mL/hr over 60 Minutes Intravenous Every 48 hours 12/26/15 1014     12/26/15 1100  ceFEPIme (MAXIPIME) 1 g in dextrose 5 % 50 mL IVPB     1 g 100 mL/hr over 30 Minutes Intravenous Every 12 hours 12/26/15 1014     12/25/15 0830  vancomycin (VANCOCIN) 500 mg in sodium chloride 0.9 % 100 mL IVPB     500 mg 100 mL/hr over 60 Minutes Intravenous  Once 12/25/15 0816 12/25/15 1046   12/22/15 1200  amikacin (AMIKIN) injection 500 mg  Status:  Discontinued     500 mg Inhalation 2 times daily 12/22/15 0950 12/27/15 0030   12/21/15 1030  vancomycin  (VANCOCIN) 500 mg in sodium chloride 0.9 % 100 mL IVPB  Status:  Discontinued     500 mg 100 mL/hr over 60 Minutes Intravenous Every 24 hours 12/20/15 1156 12/23/15 0954   12/20/15 2300  vancomycin (VANCOCIN) 500 mg in sodium chloride 0.9 % 100 mL IVPB  Status:  Discontinued     500 mg 100 mL/hr over 60 Minutes Intravenous Every 12 hours 12/20/15 1145 12/20/15 1156   12/20/15 0930  vancomycin (VANCOCIN) 1,250 mg in sodium chloride 0.9 % 250 mL IVPB     1,250 mg 166.7 mL/hr over 90 Minutes Intravenous  Once 12/20/15 0858 12/20/15 1211   12/15/15 1300  fluconazole (DIFLUCAN) tablet 100 mg  Status:  Discontinued     100 mg Oral Daily 12/15/15 1211 12/28/15 1129   12/15/15 1230  colistimethate (COLYMYCIN) 137.25 mg in sodium chloride 0.9 % 100 mL IVPB  Status:  Discontinued     5 mg/kg/day  54.9 kg 203.7 mL/hr over 30 Minutes Intravenous Every 12 hours 12/15/15 1211 12/19/15 1147   12/13/15 2200  tigecycline (TYGACIL) 50 mg in sodium chloride 0.9 % 100 mL IVPB  Status:  Discontinued     50 mg 200 mL/hr over 30 Minutes Intravenous Every 12 hours 12/13/15 0856 12/15/15 1211   12/13/15 1000  tigecycline (TYGACIL) 100 mg in sodium chloride 0.9 % 100 mL IVPB     100 mg 200 mL/hr over 30 Minutes Intravenous  Once 12/13/15 0856 12/13/15 1117   12/13/15 1000  aztreonam (AZACTAM) 2 g in dextrose 5 % 50 mL IVPB  Status:  Discontinued     2 g 100 mL/hr over 30 Minutes Intravenous 3 times per day 12/13/15 0856 12/15/15 1211   12/07/15 0600  imipenem-cilastatin (PRIMAXIN) 500 mg in sodium chloride 0.9 % 100 mL IVPB  Status:  Discontinued     500 mg 200  mL/hr over 30 Minutes Intravenous 4 times per day 12/06/15 2311 12/06/15 2329   12/07/15 0000  levofloxacin (LEVAQUIN) IVPB 750 mg  Status:  Discontinued     750 mg 100 mL/hr over 90 Minutes Intravenous Every 24 hours 12/06/15 2339 12/13/15 0856   12/06/15 2315  vancomycin (VANCOCIN) 500 mg in sodium chloride 0.9 % 100 mL IVPB  Status:  Discontinued      500 mg 100 mL/hr over 60 Minutes Intravenous Every 12 hours 12/06/15 2311 12/13/15 0856   12/06/15 2230  imipenem-cilastatin (PRIMAXIN) 500 mg in sodium chloride 0.9 % 100 mL IVPB     500 mg 200 mL/hr over 30 Minutes Intravenous  Once 12/06/15 2209 12/06/15 2335      DVT Prophylaxis: Prophylactic  Heparin   Code Status: Full code   Family Communication Mother/Father at bedside  Procedures: Chronic trach / PEG >> Suprapubic catheter changed 3/8  CONSULTS:  pulmonary/intensive care, ID and Palliative care  Time spent 20 minutes-Greater than 50% of this time was spent in counseling, explanation of diagnosis, planning of further management, and coordination of care.  MEDICATIONS: Scheduled Meds: . acetaminophen (TYLENOL) oral liquid 160 mg/5 mL  500 mg Per Tube TID  . antiseptic oral rinse  7 mL Mouth Rinse QID  . baclofen  5 mg Per Tube QID  . buPROPion  150 mg Oral Daily  . ceFEPime (MAXIPIME) IV  1 g Intravenous Q12H  . chlorhexidine gluconate (SAGE KIT)  15 mL Mouth Rinse BID  . famotidine (PEPCID) IV  20 mg Intravenous Q12H  . fentaNYL  50 mcg Transdermal Q72H  . free water  200 mL Per Tube Q6H  . heparin  5,000 Units Subcutaneous 3 times per day  . insulin aspart  0-20 Units Subcutaneous 6 times per day  . insulin glargine  16 Units Subcutaneous Daily  . LORazepam  0.5 mg Per Tube TID  . methylPREDNISolone (SOLU-MEDROL) injection  20 mg Intravenous Q12H  . midodrine  5 mg Per Tube TID AC  . mupirocin cream   Topical Daily  . naLOXone (NARCAN)  injection  0.4 mg Intramuscular Once  . vancomycin  750 mg Intravenous Q48H   Continuous Infusions: . sodium chloride 10 mL/hr (12/30/15 0919)  . feeding supplement (GLUCERNA 1.2 CAL) 1,000 mL (12/29/15 1815)   PRN Meds:.acetaminophen, albuterol, dextrose, EPINEPHrine, hydrALAZINE, LORazepam, oxyCODONE, sodium chloride flush    PHYSICAL EXAM: Vital signs in last 24 hours: Filed Vitals:   12/30/15 0731 12/30/15 0819  12/30/15 1134 12/30/15 1144  BP:  124/86 120/87   Pulse: 89 83 91   Temp:  99.6 F (37.6 C)    TempSrc:  Oral    Resp: 15 25 34   Height:      Weight:      SpO2: 100% 100% 100% 100%    Weight change: 0.136 kg (4.8 oz) Filed Weights   12/28/15 0500 12/29/15 0500 12/30/15 0500  Weight: 53.751 kg (118 lb 8 oz) 54.114 kg (119 lb 4.8 oz) 54.25 kg (119 lb 9.6 oz)   Body mass index is 18.19 kg/(m^2).   Gen Exam: sleeping-easily awakes-answers questions with yes/no. Does not appear to be in distress  Neck: Supple   Chest: Some rales on the right side-but mostly clear to auscultation anteriorly.  CVS: S1 S2 Regular  Abdomen: soft, BS +, non tender, PEG tube in place, suprapubic catheter in place. Left lower quadrant colostomy in place Extremities: no edema, lower extremities warm to touch.  Intake/Output from previous day:  Intake/Output Summary (Last 24 hours) at 12/30/15 1311 Last data filed at 12/30/15 1200  Gross per 24 hour  Intake 2496.33 ml  Output   2230 ml  Net 266.33 ml     LAB RESULTS: CBC  Recent Labs Lab 12/26/15 0907 12/26/15 2116 12/27/15 0528 12/29/15 0945 12/30/15 0445  WBC 21.8* 27.9* 32.0* 38.7* 33.2*  HGB 6.5* 7.8* 7.9* 8.2* 8.5*  HCT 21.9* 25.7* 26.3* 27.2* 28.9*  PLT 582* 545* 575* 655* 725*  MCV 81.1 80.3 80.7 82.2 81.9  MCH 24.1* 24.4* 24.2* 24.8* 24.1*  MCHC 29.7* 30.4 30.0 30.1 29.4*  RDW 16.4* 15.9* 16.2* 17.0* 17.3*  LYMPHSABS  --   --   --   --  1.7  MONOABS  --   --   --   --  1.3*  EOSABS  --   --   --   --  0.0  BASOSABS  --   --   --   --  0.0    Chemistries   Recent Labs Lab 12/24/15 0500 12/26/15 0907 12/27/15 0528 12/29/15 0945 12/30/15 0445  NA 140  --  136 138 132*  K 4.5  --  4.0 4.3 5.4*  CL 105  --  105 107 103  CO2 25  --  21* 21* 19*  GLUCOSE 274*  --  331* 241* 453*  BUN 42*  --  54* 69* 67*  CREATININE 1.99* 2.30* 2.11* 1.72* 1.61*  CALCIUM 11.8*  --  10.4* 10.2 9.8  MG  --   --  1.7  --   --      CBG:  Recent Labs Lab 12/29/15 2358 12/30/15 0359 12/30/15 0515 12/30/15 0637 12/30/15 0817  GLUCAP 137* 386* 413* 315* 298*    GFR Estimated Creatinine Clearance: 51.1 mL/min (by C-G formula based on Cr of 1.61).  Coagulation profile No results for input(s): INR, PROTIME in the last 168 hours.  Cardiac Enzymes No results for input(s): CKMB, TROPONINI, MYOGLOBIN in the last 168 hours.  Invalid input(s): CK  Invalid input(s): POCBNP No results for input(s): DDIMER in the last 72 hours. No results for input(s): HGBA1C in the last 72 hours. No results for input(s): CHOL, HDL, LDLCALC, TRIG, CHOLHDL, LDLDIRECT in the last 72 hours. No results for input(s): TSH, T4TOTAL, T3FREE, THYROIDAB in the last 72 hours.  Invalid input(s): FREET3 No results for input(s): VITAMINB12, FOLATE, FERRITIN, TIBC, IRON, RETICCTPCT in the last 72 hours. No results for input(s): LIPASE, AMYLASE in the last 72 hours.  Urine Studies No results for input(s): UHGB, CRYS in the last 72 hours.  Invalid input(s): UACOL, UAPR, USPG, UPH, UTP, UGL, UKET, UBIL, UNIT, UROB, ULEU, UEPI, UWBC, URBC, UBAC, CAST, UCOM, BILUA  MICROBIOLOGY: Recent Results (from the past 240 hour(s))  Culture, blood (routine x 2)     Status: None   Collection Time: 12/20/15  3:20 PM  Result Value Ref Range Status   Specimen Description BLOOD LEFT ANTECUBITAL  Final   Special Requests BOTTLES DRAWN AEROBIC AND ANAEROBIC 5CC  Final   Culture NO GROWTH 5 DAYS  Final   Report Status 12/25/2015 FINAL  Final  Culture, blood (routine x 2)     Status: None   Collection Time: 12/20/15  3:29 PM  Result Value Ref Range Status   Specimen Description BLOOD LEFT HAND  Final   Special Requests IN PEDIATRIC BOTTLE 3CC  Final   Culture NO GROWTH 5 DAYS  Final  Report Status 12/25/2015 FINAL  Final  Culture, respiratory (NON-Expectorated)     Status: None   Collection Time: 12/24/15  1:45 PM  Result Value Ref Range Status    Specimen Description TRACHEAL ASPIRATE  Final   Special Requests Normal  Final   Gram Stain   Final    ABUNDANT WBC PRESENT, PREDOMINANTLY PMN FEW SQUAMOUS EPITHELIAL CELLS PRESENT NO ORGANISMS SEEN THIS SPECIMEN IS ACCEPTABLE FOR SPUTUM CULTURE Performed at [facility]    Culture   Final    RARE METHICILLIN RESISTANT STAPHYLOCOCCUS AUREUS 22 Note: RIFAMPIN AND GENTAMICIN SHOULD NOT BE USED AS SINGLE DRUGS FOR TREATMENT OF STAPH INFECTIONS. CRITICAL RESULT CALLED TO, READ BACK BY AND VERIFIED WITH: TROYCE H. 3 17 @ 0821 BY PARDA Performed at [facility]    Report Status 12/28/2015 FINAL  Final   Organism ID, Bacteria METHICILLIN RESISTANT STAPHYLOCOCCUS AUREUS  Final      Susceptibility   Methicillin resistant staphylococcus aureus - MIC*    CLINDAMYCIN >=8 RESISTANT Resistant     ERYTHROMYCIN >=8 RESISTANT Resistant     GENTAMICIN <=0.5 SENSITIVE Sensitive     LEVOFLOXACIN >=8 RESISTANT Resistant     OXACILLIN >=4 RESISTANT Resistant     RIFAMPIN <=0.5 SENSITIVE Sensitive     TRIMETH/SULFA >=320 RESISTANT Resistant     VANCOMYCIN 1 SENSITIVE Sensitive     TETRACYCLINE >=16 RESISTANT Resistant     * RARE METHICILLIN RESISTANT STAPHYLOCOCCUS AUREUS  Culture, blood (routine x 2)     Status: None   Collection Time: 12/25/15  6:46 PM  Result Value Ref Range Status   Specimen Description BLOOD LEFT ARM  Final   Special Requests IN PEDIATRIC BOTTLE 3CC  Final   Culture NO GROWTH 5 DAYS  Final   Report Status 12/30/2015 FINAL  Final  Culture, blood (routine x 2)     Status: None   Collection Time: 12/25/15  6:52 PM  Result Value Ref Range Status   Specimen Description BLOOD LEFT ARM  Final   Special Requests IN PEDIATRIC BOTTLE 3CC  Final   Culture NO GROWTH 5 DAYS  Final   Report Status 12/30/2015 FINAL  Final    RADIOLOGY STUDIES/RESULTS: Ct Abdomen Pelvis Wo Contrast  12/10/2015  CLINICAL DATA:  Paralyzed from accident. Rule out fistula from bladder to  ostomy. Severe case of MRSA. Decubitus ulcers to back and buttocks. Oral contrast only due to inadequate IV access. EXAM: CT ABDOMEN AND PELVIS WITHOUT CONTRAST TECHNIQUE: Multidetector CT imaging of the abdomen and pelvis was performed following the standard protocol without IV contrast. COMPARISON:  None. FINDINGS: Lower chest: There are bibasilar consolidations which could represent atelectasis or pneumonia. There are also small bilateral pleural effusions. Hepatobiliary: No mass visualized on this un-enhanced exam. Pancreas: No mass or inflammatory process identified on this un-enhanced exam. Spleen: Within normal limits in size. Adrenals/Urinary Tract: Bladder walls appear thickened. Suprapubic catheter in place without obvious complicating feature. Kidneys are unremarkable without stone or hydronephrosis. Stomach/Bowel: Gastrostomy tube in place. Soft tissue defect of the left lower abdominal wall, presumed colostomy. No large or small bowel dilatation. Vascular/Lymphatic: No pathologically enlarged lymph nodes. No evidence of abdominal aortic aneurysm. IVC filter in place, well-positioned just below the level of the renal veins. Reproductive: No mass or other significant abnormality. Other: Small amount of free fluid within the right paracolic gutter and left lower quadrant. No abscess collection identified within the abdomen or pelvis. No free intraperitoneal air. No obvious fistula between  the bladder and left lower abdominal ostomy site. Musculoskeletal: Ill-defined edema throughout the subcutaneous soft tissues suggesting anasarca. Soft tissue defects within the bilateral buttocks region, compatible with the given history of decubitus ulcers. Additional large soft tissue defect overlying the sacrum, again compatible with the given history of decubitus ulcer. Underlying the decubitus ulcer sites, there are fairly well-defined cortical defects within the posterior right iliac bone and posterior sacrum.  IMPRESSION: 1. Circumferential thickening of the walls of the bladder suspicious for cystitis. Recommend correlation with urinalysis. Suprapubic catheter in place. 2. Left lower abdominal wall defect, presumed colostomy. No fistulous connection identified between the bladder and this presumed colostomy. If clinical concern for fistula persists, could consider CT cystogram by injecting contrast into the bladder through the indwelling suprapubic catheter. 3. Small amount of ascites. No intra-abdominal or intrapelvic abscess collection seen. No free intraperitoneal air. 4. Decubitus ulcers of the bilateral buttocks regions and overlying the sacrum. 5. Cortical defects along the posterior margins of the right iliac bone and posterior sacrum, somewhat well-defined, immediately underlying the sites of the decubitus ulcers. I suspect that these are chronic osseous erosions related to the overlying decubitus ulcers. Alternatively, these could be postsurgical changes performed in conjunction with decubitus ulcer debridements if surgical history correlates. Destructive changes of osteomyelitis is certainly an additional possibility. MRI may be helpful for further characterization. 6. Bibasilar consolidations, atelectasis versus pneumonia, with small adjacent pleural effusions. These results will be called to the ordering clinician or representative by the Radiologist Assistant, and communication documented in the PACS or zVision Dashboard. Electronically Signed   By: Franki Cabot M.D.   On: 12/10/2015 18:50   Dg Chest Port 1 View  12/29/2015  CLINICAL DATA:  Tracheostomy patient, healthcare associated pneumonia old with acute and chronic respiratory failure EXAM: PORTABLE CHEST 1 VIEW COMPARISON:  Portable chest x-ray of December 23, 2015 FINDINGS: The lungs are well-expanded. There has been marked improvement in the appearance of the infiltrate in the right lung. Mild interstitial prominence persists in the mid and lower  lung. There is no pleural effusion. The left lung is clear. The heart and pulmonary vascularity are normal. The tracheostomy appliance tip lies at the inferior margin of the clavicular heads which is 5.8 cm above the carina. The right PICC line catheter tip projects over the distal SVC. IMPRESSION: Ongoing improvement of right-sided pneumonia. There is no significant pleural effusion nor evidence of CHF. Electronically Signed   By: David  [place] M.D.   On: 12/29/2015 07:27   Portable Chest Xray  12/23/2015  CLINICAL DATA:  Pneumonia EXAM: PORTABLE CHEST 1 VIEW COMPARISON:  12/22/2015 FINDINGS: Right PICC and tracheostomy tube are stable. Bilateral airspace disease is stable. There is partial collapse of the right lower lobe which is stable. No pneumothorax. IMPRESSION: Stable bilateral airspace disease. Stable partial collapse of the right lower lobe. Electronically Signed   By: Marybelle Killings M.D.   On: 12/23/2015 08:14   Dg Chest Port 1 View  12/22/2015  CLINICAL DATA:  Dyspnea.  Quadriplegia. EXAM: PORTABLE CHEST 1 VIEW COMPARISON:  12/20/2015 FINDINGS: Support apparatus stable. Increased right perihilar and basilar airspace opacity. The patient is rotated to the right on today's radiograph, reducing diagnostic sensitivity and specificity. Deformity of the right distal clavicle, stable. Improved aeration at the left lung base. IMPRESSION: 1. Significant worsening of airspace opacity in the right mid lung and right lung base, nonspecific but potentially from pneumonia. However, there is some improvement in the airspace opacity at the  left lung base. Electronically Signed   By: Van Clines M.D.   On: 12/22/2015 09:38   Dg Chest Port 1 View  12/20/2015  CLINICAL DATA:  Respiratory failure EXAM: PORTABLE CHEST 1 VIEW COMPARISON:  December 15, 2015 FINDINGS: Tracheostomy catheter tip is 4.8 cm above the carina. Central catheter tip is in the superior vena cava near the cavoatrial junction. There is no  appreciable pneumothorax. There is airspace consolidation in the medial right base with small right effusion. Left lung is clear. Heart is upper normal in size with pulmonary vascularity within normal limits. No adenopathy. There is lumbar levoscoliosis. IMPRESSION: Tube and catheter positions as described without pneumothorax. Consolidation medial right base with small right effusion. Left lung clear. No change in cardiac silhouette. Electronically Signed   By: Lowella Grip III M.D.   On: 12/20/2015 07:37   Dg Chest Port 1 View  12/15/2015  CLINICAL DATA:  Followup exam.  Pneumonia. EXAM: PORTABLE CHEST 1 VIEW COMPARISON:  12/13/2015 FINDINGS: Persistent lung base opacity noted consistent with combination of pleural fluid and atelectasis. Pneumonia not excluded. No evidence of pulmonary edema. No pneumothorax. Heart, mediastinum hila are unremarkable. Tracheostomy tube is stable in well positioned. IMPRESSION: 1. No change from the previous exam. 2. Persistent lower lung zone opacity consistent with a combination pleural fluid and probable atelectasis. Pneumonia is possible. Electronically Signed   By: Lajean Manes M.D.   On: 12/15/2015 10:10   Dg Chest Port 1 View  12/13/2015  CLINICAL DATA:  Pneumonia. EXAM: PORTABLE CHEST 1 VIEW COMPARISON:  December 09, 2015. FINDINGS: Stable cardiomediastinal silhouette. No pneumothorax is noted. Mildly increased bilateral pleural effusions are noted with probable associated atelectasis. Tracheostomy tube is unchanged in position. Bony thorax is unremarkable. IMPRESSION: Mildly increased bibasilar opacities are noted most consistent with subsegmental atelectasis and associated pleural effusions. Electronically Signed   By: Marijo Conception, M.D.   On: 12/13/2015 10:16   Dg Chest Port 1 View  12/09/2015  CLINICAL DATA:  Pneumonia. EXAM: PORTABLE CHEST 1 VIEW COMPARISON:  12/08/2015. FINDINGS: Tracheostomy to in stable position. Mediastinum hilar structures normal. Low  lung volumes with persistent bibasilar atelectasis and/or infiltrates again noted. Small pleural effusions again noted. No pneumothorax. IMPRESSION: 1. Tracheostomy tube in stable position. 2. Low lung volumes with persistent bibasilar atelectasis and/or infiltrates. Persistent bilateral pleural effusions. Similar findings noted on prior exam. Electronically Signed   By: [place]   On: 12/09/2015 07:20   Dg Chest Port 1 View  12/08/2015  CLINICAL DATA:  Acute respiratory failure. EXAM: PORTABLE CHEST 1 VIEW COMPARISON:  12/07/2015. FINDINGS: Tracheostomy tube in stable position. Heart size stable. Persistent bibasilar atelectasis and/or infiltrates. Small left pleural effusion. No pneumothorax. Right costophrenic angle not imaged. IMPRESSION: 1. Tracheostomy tube in stable position. 2. Persistent bibasilar atelectasis and/or infiltrates without interim clearing. Small left pleural effusion again noted. Electronically Signed   By: Marcello Moores  Register   On: 12/08/2015 07:09   Dg Chest Port 1 View  12/07/2015  CLINICAL DATA:  Acute onset of respiratory failure. Initial encounter. EXAM: PORTABLE CHEST 1 VIEW COMPARISON:  Chest radiograph performed 12/06/2015 FINDINGS: The patient's tracheostomy tube is seen ending 5 cm above the carina. Bibasilar airspace opacities may reflect pneumonia or pulmonary edema. Small bilateral pleural effusions are noted. No pneumothorax is seen. The cardiomediastinal silhouette remains normal in size. No acute osseous abnormalities are identified. IMPRESSION: Bibasilar airspace opacities may reflect pneumonia or pulmonary edema. Small bilateral pleural effusions noted. Electronically Signed  By: Garald Balding M.D.   On: 12/07/2015 06:43   Dg Chest Portable 1 View  12/06/2015  CLINICAL DATA:  30 year old male with sepsis and decreased O2 sats. EXAM: PORTABLE CHEST 1 VIEW COMPARISON:  Radiograph dated 11/02/2015 FINDINGS: Tracheostomy remains in stable positioning above the  carina. There are bibasilar opacities with silhouetting of the diaphragm, concerning for pneumonia. There has been interval increase in the left lung base density compared to the prior study. Small bilateral pleural effusions may be present. The cardiac silhouette is within normal limits. No acute osseous pathology. IMPRESSION: Bibasilar airspace densities concerning for pneumonia. Clinical correlation and follow-up recommended. Electronically Signed   By: Anner Crete M.D.   On: 12/06/2015 19:59   Dg Abd Portable 1v  12/18/2015  CLINICAL DATA:  Acute onset of abdominal distention. Initial encounter. EXAM: PORTABLE ABDOMEN - 1 VIEW COMPARISON:  CT of the abdomen and pelvis from 12/10/2015 FINDINGS: The visualized bowel gas pattern is unremarkable. Scattered air and stool filled loops of colon are seen; no abnormal dilatation of small bowel loops is seen to suggest small bowel obstruction. No free intra-abdominal air is identified, though evaluation for free air is limited on a single supine view. The stomach is partially filled with air. An IVC filter is noted. The visualized osseous structures are within normal limits; the sacroiliac joints are unremarkable in appearance. The visualized lung bases are essentially clear. IMPRESSION: Unremarkable bowel gas pattern; no free intra-abdominal air seen. Moderate amount of stool noted in the colon. Stomach partially filled with air. Electronically Signed   By: Garald Balding M.D.   On: 12/18/2015 21:10    Oren Binet, MD  [facility] Pager:[phone]  If 7PM-7AM, please contact night-coverage [email or web address] Password [email or web address] 12/30/2015, 1:11 PM   LOS: 24 days

## 2015-12-30 NOTE — Progress Notes (Signed)
Pharmacy Antibiotic Note  27 YOM with MDR pseudomonas PNA and CoNS bacteremia.  Pharmacy managing Vancomycin and Cefepime.  Patient's allergic reaction to cefuroxime is anaphylaxis; however, cefuroxime and cefepime are not structurally similar.  Mother is aware of risk per MD. Currently tolerating Cefepime with premedications. Wbc improved to 33.2.   His renal function has continued to improve today. SCr down 2.11>>1.72>>1.61, UOP 1.9 mL/kg/hr. A vancomycin trough collected today remains therapeutic at 15.    Plan: - Continue Vanc 750mg  IV Q48H. If SCr continues to improve, will need Vanc dosage increase  - Monitor renal fxn, clinical progress - Increase Cefepime to 1gm IV Q 12 H.  Premedicate with Solu-Medrol 20mg  IV and Pepcid 20mg  IV 30 min prior to med administration.  PRN albuterol nebs Q4H PRN as already ordered.  Epinephrine IV PRN for anaphylaxis.    Height: 5\' 8"  (172.7 cm) Weight: 119 lb 9.6 oz (54.25 kg) IBW/kg (Calculated) : 68.4  Temp (24hrs), Avg:99.5 F (37.5 C), Min:98.2 F (36.8 C), Max:100.8 F (38.2 C)   Recent Labs Lab 12/24/15 0500 12/25/15 0500 12/26/15 0907 12/26/15 2116 12/27/15 0528 12/29/15 0945 12/30/15 0445 12/30/15 1337  WBC  --   --  21.8* 27.9* 32.0* 38.7* 33.2*  --   CREATININE 1.99*  --  2.30*  --  2.11* 1.72* 1.61*  --   VANCOTROUGH  --   --   --   --   --   --   --  15  VANCORANDOM  --  15 17  --   --   --   --   --     Estimated Creatinine Clearance: 51.1 mL/min (by C-G formula based on Cr of 1.61).    Allergies  Allergen Reactions  . Cefuroxime Axetil Anaphylaxis    Tolerated cefepime 12/2015 - with Pepcid/Solu-Medrol  . Ertapenem Other (See Comments)    Rash and confusion-->tolerated Imipenem   . Morphine And Related Other (See Comments)    Changed mental status, confusion, headache, visual hallucination  . Penicillins Anaphylaxis and Other (See Comments)    Tolerated Imipenem; no reaction to 7 day course of amoxicillin in  2015 Has patient had a PCN reaction causing immediate rash, facial/tongue/throat swelling, SOB or lightheadedness with hypotension: Yes Has patient had a PCN reaction causing severe rash involving mucus membranes or skin necrosis: No Has patient had a PCN reaction that required hospitalization Yes Has patient had a PCN reaction occurring within the last 10 years: No If all of the above answers are "NO", then may proceed with Cephalo  . Sulfa Antibiotics Anaphylaxis, Shortness Of Breath and Other (See Comments)  . Tessalon [Benzonatate] Anaphylaxis  . Shellfish Allergy Itching and Other (See Comments)    Took benadryl to alleviate reaction  . Levaquin [Levofloxacin In D5w] Swelling    Hand and forearm swelling  . Nsaids Itching and Other (See Comments)    Risk of bleeding, itching  . Miripirium Rash and Other (See Comments)    Change in mental status    Antimicrobials this admission: Vanc 3/1 >> 3/7, 3/14 >> Levaquin 3/1 >> 3/7 Primaxin x 1 2/28 Aztreonam 3/7 >> 3/9 Tigecycline 3/7 >> 3/9 Colistin 3/9 >> 3/13 Fluc 3/9 >> Amikacin 3/16 >> Cefepime 3/20 >>  Dose adjustments this admission: 3/2 VT = 19 mcg/mL 3/17 0530 VR = 35 on 500mg  q24 (obtained prior to 3rd dose) 3/19 0500 VR = 15, gave vanc 500mg  x1 3/20 0900 VR = 17 mcg/mL >> vanc 750mg  q48 (  SCr 2.3) 3/24 1030 VT = 15 >> continue Vanc 750 mg IV Q61f  Microbiology results: 2/28 UCx >> mult species FINAL 2/28 BCx x2>> ngF 3/1 BCx x2>> ngF 3/1 MRSA PCR >> positive 3/6 TA - Pseudomonas 3/6  blood x 2:  NF 3/7  Urine:  > 100K yeast 3/13 C.diff: neg 3/12 blood cx: CoNS - pending 3/14 blood cx: NGTD 3/19 BCx x2 -    Albertina Parr, PharmD., BCPS Clinical Pharmacist Pager [phone]

## 2015-12-30 NOTE — Progress Notes (Signed)
PMT RN: Met with mother for approx 30 minutes to provide support and ensure agreement with plan. Discussed availability of PMT this weekend. Let her know that someone from the team will see them Monday. Larina Earthly, RN, BSN, [facility] 12/30/2015 3:46 PM Cell [phone] 8:00-4:00 Monday-Friday Office [phone]

## 2015-12-30 NOTE — Consult Note (Signed)
WOC follow-up: Patient continues to leak yellow drainage from rectum around [facility]. This is saturating chux underneath patient and Vac dressing would be unable to maintain a seal related to the close proximity to the rectum and constant moisture. Continue moist gauze packing to bilat ischium and sacrum wounds until this problem is resolved. Mother is asleep at the bedside; informed bedside nurse. Julien Girt MSN, RN, [place], [place], [place]

## 2015-12-30 NOTE — Progress Notes (Signed)
PULMONARY / CRITICAL CARE MEDICINE   Name: Jason [name] MRN: [id number] DOB: 13-Jul-1986    ADMISSION DATE:  12/06/2015 CONSULTATION DATE:  12/06/15  REFERRING MD:  EDP  CHIEF COMPLAINT:  Decrease in O2 sats, fever, PNA  SUBJECTIVE: No events overnight, failed weaning again this AM and required higher pressure support.  VITAL SIGNS: BP 120/87 mmHg  Pulse 91  Temp(Src) 99.6 F (37.6 C) (Oral)  Resp 34  Ht 5\' 8"  (1.727 m)  Wt 54.25 kg (119 lb 9.6 oz)  BMI 18.19 kg/m2  SpO2 100%  VENTILATOR SETTINGS: Vent Mode:  [-] PRVC FiO2 (%):  [30 %] 30 % Set Rate:  [14 bmp] 14 bmp Vt Set:  [550 mL] 550 mL PEEP:  [5 cmH20] 5 cmH20 Pressure Support:  [10 cmH20-15 cmH20] 10 cmH20 Plateau Pressure:  [19 cmH20-28 cmH20] 28 cmH20  INTAKE / OUTPUT: I/O last 3 completed shifts: In: 1990 [I.V.:220; NG/GT:1570; IV Piggyback:200] Out: 3580 [Urine:2830; Stool:750]  PHYSICAL EXAMINATION: General: Chronically ill --> interacting more this AM. Neuro: Opens eyes , gestures to his wife and communicates with her HEENT: PERRL, Clean trach site. Cardiovascular: RRR, no MRG Lungs: diffuse coarse BS.  Abdomen: PEG in place, + BS, ostomy Musculoskeletal: Muscle wasting bilaterally. Skin: No rashes. Sacral wound vac in place.   LABS:  BMET  Recent Labs Lab 12/27/15 0528 12/29/15 0945 12/30/15 0445  NA 136 138 132*  K 4.0 4.3 5.4*  CL 105 107 103  CO2 21* 21* 19*  BUN 54* 69* 67*  CREATININE 2.11* 1.72* 1.61*  GLUCOSE 331* 241* 453*   Electrolytes  Recent Labs Lab 12/27/15 0528 12/29/15 0945 12/30/15 0445  CALCIUM 10.4* 10.2 9.8  MG 1.7  --   --   PHOS 3.1  --   --    CBC  Recent Labs Lab 12/27/15 0528 12/29/15 0945 12/30/15 0445  WBC 32.0* 38.7* 33.2*  HGB 7.9* 8.2* 8.5*  HCT 26.3* 27.2* 28.9*  PLT 575* 655* 725*   Coag's No results for input(s): APTT, INR in the last 168 hours.  Sepsis Markers No results for input(s): LATICACIDVEN, PROCALCITON, O2SATVEN in  the last 168 hours.  ABG No results for input(s): PHART, PCO2ART, PO2ART in the last 168 hours.  Liver Enzymes  Recent Labs Lab 12/29/15 0945 12/30/15 0445  AST 21 18  ALT 18 18  ALKPHOS 85 98  BILITOT 0.4 0.5  ALBUMIN 2.4* 2.4*   Cardiac Enzymes No results for input(s): TROPONINI, PROBNP in the last 168 hours.  Glucose  Recent Labs Lab 12/29/15 2358 12/30/15 0359 12/30/15 0515 12/30/15 0637 12/30/15 0817 12/30/15 1256  GLUCAP 137* 386* 413* 315* 298* 230*   Imaging No results found.  STUDIES:  CXR 02/28 > bibasilar airspace densities concerning for PNA. CT abdo/ pelvis>>>no fistula CXR 3/7 >>mildly increased bibasilar opacities are noted most consistent with subsegmental atelectasis and associated pleural effusions.  EEG 03/13 > mild to mod diffuse slowing with triphasic waves (could be seen with hypoxic/ischemic injury, toxic/metabolic encephalopathies (hepatic?), med effect). CXR 3/14 >> Consolidation medial right base with small right effusion, left lung clear  CULTURES: Blood 02/28 >>No growth  Urine 02/28 >>Multiple species, suggest recollection Blood 3/1>> No growth  Sputum 3/6 >>> few pseudomonas >>>MDR Blood Culture 3/6 >> neg Urine Culture 3/7 >>Yeast  MRSA pos screen Blood 03/12 > GPC's > coag neg staph CDiff 3/13 >> Negative  Blood 3/14 >>  ANTIBIOTICS: Vanc 02/28 (7/7) >> 3/7 Levaquin 02/28 (7/7) >>3/7 Tigecycline 3/7 >>  3/9 Aztreonam 3/7 >> 3/9 Colistin 3/9 >> 3/13 Fluconazole 03/9 > Vanc 03/14 > Inhaled amikacin 3/16>>> Cefepime 3/19>>>  SIGNIFICANT EVENTS: 2/28  Admitted with probable HCAP 3/2- no fevers, low secretions, remains vented, refusing Chest PT and other treatments 3/3 improved to TC, pcxr better 3/5- trach collar 24  Hrs 3/6- Tmax 103.2 3/8 still spiking temps  3/9 Colistin started.  3/13: encephalopathic, rising creatinine, hyperkalemic.  3/14 > remains encephalopathic.  1/2 blood cx's with GPC's.  3/16 :  unresponsive. Resp pattern worse, agonal. Adding inhaled amikacin, placed back on vent. Began talks w/ mother and father about EOL. They understand he may not survive. Mom not ready to make DNR, but says "she is prepared and won't let trent suffer".   LINES/TUBES: Chronic trach / PEG >> Suprapubic catheter changed 3/8  I reviewed CXR myself, trach in good position, infiltrate noted, improving on the right.  ASSESSMENT / PLAN:  PULMONARY A: Chronic respiratory failure with trach dependence - Now acute on Chronic respiratory failure on 3/16 w/ agonal resp pattern.  Tracheostomy status MDR pseudeomonas PNA -->concerned that his clinical decline reflects the result of [name] stopping treatment for the pseudomonas. We had hoped that this was a colonizer, but his rising WBC, resp distress & worsening CXR and overall clinical decline would reflect otherwise.  P:  PS as able (on 15/5 currently), I do not foresee trent coming off the vent, I believe he is so deconditioned that he will likely need a ventilator outside the hospital 24/7. See ID section. CXR in AM.  CARDIOVASCULAR A:  Chronic hypotension - on midodrine P:  Continue midodrine at current dose.  Monitor hemodynamics.  INFECTIOUS A:   Bacteremia - 1/2 blood cultures from 03/12 with GPC's.  Also with increasing leukocytosis as of 03/14 UTI - hx pseudomonal UTI in past with resistance to multiple abx - positive for yeast  HCAP-->pseudomonas MDR given clinical decline feel that this is more than a colonizer.  Hx sacral wounds P:   ID following - appreciate the assistance. Abx per ID. WOC following.  Discussed with bedside RN and RT.  Rush Farmer, M.D. [facility] Pulmonary/Critical Care Medicine. Pager: [phone]. After hours pager: [phone].

## 2015-12-30 NOTE — Progress Notes (Signed)
SLP Cancellation Note  Patient Details Name: Jason [name] MRN: [id number] DOB: [birth year]   Cancelled treatment:        Requested and received orders for in-line PMSV. Unable to coordinate with RT this afternoon. Will attempt 3/27.   [name] 12/30/2015, 2:24 PM

## 2015-12-30 NOTE — Progress Notes (Signed)
[place] for Infectious Disease   Reason for visit: Follow up on VAP  Interval History: afebrile, on cefepime with premedication and no issues.  WBC improved, creat improved  Physical Exam: Constitutional:  Filed Vitals:   12/30/15 1300 12/30/15 1400  BP:  130/82  Pulse:    Temp: 100.2 F (37.9 C)   Resp:     patient intially sleeping, appears in NAD,opens eyes Eyes: anicteric HENT: + trach Respiratory: on vent Cardiovascular: Tachy RR   Review of Systems: Constitutional: negative for fevers and chills Gastrointestinal: negative for nausea and diarrhea All other systems reviewed and are negative   Lab Results  Component Value Date   WBC 33.2* 12/30/2015   HGB 8.5* 12/30/2015   HCT 28.9* 12/30/2015   MCV 81.9 12/30/2015   PLT 725* 12/30/2015    Lab Results  Component Value Date   CREATININE 1.61* 12/30/2015   BUN 67* 12/30/2015   NA 132* 12/30/2015   K 5.4* 12/30/2015   CL 103 12/30/2015   CO2 19* 12/30/2015    Lab Results  Component Value Date   ALT 18 12/30/2015   AST 18 12/30/2015   ALKPHOS 98 12/30/2015     Microbiology: Recent Results (from the past 240 hour(s))  Culture, respiratory (NON-Expectorated)     Status: None   Collection Time: 12/24/15  1:45 PM  Result Value Ref Range Status   Specimen Description TRACHEAL ASPIRATE  Final   Special Requests Normal  Final   Gram Stain   Final    ABUNDANT WBC PRESENT, PREDOMINANTLY PMN FEW SQUAMOUS EPITHELIAL CELLS PRESENT NO ORGANISMS SEEN THIS SPECIMEN IS ACCEPTABLE FOR SPUTUM CULTURE Performed at [facility]    Culture   Final    RARE METHICILLIN RESISTANT STAPHYLOCOCCUS AUREUS 22 Note: RIFAMPIN AND GENTAMICIN SHOULD NOT BE USED AS SINGLE DRUGS FOR TREATMENT OF STAPH INFECTIONS. CRITICAL RESULT CALLED TO, READ BACK BY AND VERIFIED WITH: TROYCE H. 3 17 @ 0821 BY PARDA Performed at [facility]    Report Status 12/28/2015 FINAL  Final   Organism ID, Bacteria METHICILLIN  RESISTANT STAPHYLOCOCCUS AUREUS  Final      Susceptibility   Methicillin resistant staphylococcus aureus - MIC*    CLINDAMYCIN >=8 RESISTANT Resistant     ERYTHROMYCIN >=8 RESISTANT Resistant     GENTAMICIN <=0.5 SENSITIVE Sensitive     LEVOFLOXACIN >=8 RESISTANT Resistant     OXACILLIN >=4 RESISTANT Resistant     RIFAMPIN <=0.5 SENSITIVE Sensitive     TRIMETH/SULFA >=320 RESISTANT Resistant     VANCOMYCIN 1 SENSITIVE Sensitive     TETRACYCLINE >=16 RESISTANT Resistant     * RARE METHICILLIN RESISTANT STAPHYLOCOCCUS AUREUS  Culture, blood (routine x 2)     Status: None   Collection Time: 12/25/15  6:46 PM  Result Value Ref Range Status   Specimen Description BLOOD LEFT ARM  Final   Special Requests IN PEDIATRIC BOTTLE 3CC  Final   Culture NO GROWTH 5 DAYS  Final   Report Status 12/30/2015 FINAL  Final  Culture, blood (routine x 2)     Status: None   Collection Time: 12/25/15  6:52 PM  Result Value Ref Range Status   Specimen Description BLOOD LEFT ARM  Final   Special Requests IN PEDIATRIC BOTTLE 3CC  Final   Culture NO GROWTH 5 DAYS  Final   Report Status 12/30/2015 FINAL  Final    Impression/Plan:  1. VAP - MDR Pseudomonas.  On cefepime. Will continue.  Likely about 5 more days if he continues to improve  2. Chronic respiratory failure - multiple infections in the past.  Trying to wean.  Some better weaning yesterday  Discussed with mother at bedside, discussed with Dr. Sloan Leiter  Dr. Megan Salon is available over the weekend if needed, otherwise I will follow up on Monday

## 2015-12-31 LAB — GLUCOSE, CAPILLARY
GLUCOSE-CAPILLARY: 127 mg/dL — AB (ref 65–99)
GLUCOSE-CAPILLARY: 153 mg/dL — AB (ref 65–99)
GLUCOSE-CAPILLARY: 264 mg/dL — AB (ref 65–99)
Glucose-Capillary: 315 mg/dL — ABNORMAL HIGH (ref 65–99)
Glucose-Capillary: 179 mg/dL — ABNORMAL HIGH (ref 65–99)
Glucose-Capillary: 161 mg/dL — ABNORMAL HIGH (ref 65–99)

## 2015-12-31 LAB — BASIC METABOLIC PANEL
Anion gap: 10 (ref 5–15)
BUN: 62 mg/dL — ABNORMAL HIGH (ref 6–20)
CALCIUM: 9.8 mg/dL (ref 8.9–10.3)
CO2: 19 mmol/L — AB (ref 22–32)
CREATININE: 1.39 mg/dL — AB (ref 0.61–1.24)
Chloride: 107 mmol/L (ref 101–111)
Glucose, Bld: 343 mg/dL — ABNORMAL HIGH (ref 65–99)
Potassium: 5.4 mmol/L — ABNORMAL HIGH (ref 3.5–5.1)
Sodium: 136 mmol/L (ref 135–145)

## 2015-12-31 LAB — CBC
HEMATOCRIT: 27.7 % — AB (ref 39.0–52.0)
Hemoglobin: 8.6 g/dL — ABNORMAL LOW (ref 13.0–17.0)
MCH: 25.4 pg — ABNORMAL LOW (ref 26.0–34.0)
MCHC: 31 g/dL (ref 30.0–36.0)
MCV: 81.7 fL (ref 78.0–100.0)
PLATELETS: 729 10*3/uL — AB (ref 150–400)
RBC: 3.39 MIL/uL — ABNORMAL LOW (ref 4.22–5.81)
RDW: 17.3 % — AB (ref 11.5–15.5)
WBC: 34.1 10*3/uL — AB (ref 4.0–10.5)

## 2015-12-31 MED ORDER — VANCOMYCIN HCL 500 MG IV SOLR
500.0000 mg | INTRAVENOUS | Status: DC
  Administered 2015-12-31 – 2016-01-01 (×2): 500 mg via INTRAVENOUS
  Filled 2015-12-31 (×3): qty 500

## 2015-12-31 MED ORDER — DIPHENHYDRAMINE HCL 50 MG/ML IJ SOLN
12.5000 mg | Freq: Four times a day (QID) | INTRAMUSCULAR | Status: DC | PRN
  Administered 2015-12-31 – 2016-01-03 (×3): 12.5 mg via INTRAVENOUS
  Filled 2015-12-31 (×3): qty 1

## 2015-12-31 NOTE — Progress Notes (Signed)
PATIENT DETAILS Name: Jason [name] Age: 30 y.o. 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 Continues to require ventilator support during this hospital stay-PCCM attempting to wean him off the ventilator. Infectious disease directing antibiotic therapy-unfortunately patient has multiple allergies to antibiotics. Slowly improving.  Significant Events: 2/28 admit to [facility] by PCCM 3/2 no fevers, low secretions, vented, refusing Chest PT and other treatments 3/3 improved to TC, pcxr better 3/5 trach collar 24 Hrs 3/6 Tmax 103.2 3/8 still spiking temps  3/9 Colistin started  3/13 encephalopathic, rising creatinine, hyperkalemic 3/14 remains encephalopathic - 1/2 blood cx's with GPCs 3/16 unresponsive - resp pattern worse, agonal - added inhaled amikacin, placed back on vent 3/21-transferred to [facility]  Subjective: Awake and alert this morning. On trach collar-weaning trial in progress.   Assessment/Plan: Acute on chronic hypoxic respiratory failure/tracheostomy dependent: Secondary to multidrug resistant Pseudomonas pneumonia and ?MRSA. PCCM following, and attempting to wean him off the ventilator-but given deconditioning, PCCM feels that he may be vent dependent for the near future. Await PCCM follow-up.  Sepsis secondary to VAP/ MDR Pseudeomonas and MRSA FTD:DUKGU curve better, leukocytosis has started to downtrend. Given multiple allergies, drug resistant bacteria-limited options. Infectious disease following an directing care. Currently on intravenous vancomycin and  cefepime (premedication with steroids and Pepcid). Sepsis pathophysiology is slowly improving.   Acute renal failure: Likely secondary to prerenal azotemia-setting of sepsis. Improving with supportive care. Follow periodically  Acute encephalopathy: Seems to have resolved, is awake and alert this morning. Likely secondary to sepsis and renal failure. Continue IV antibiotics.EEG 03/13-without any seizure-like activity, but moderate diffuse slowing. Follow.  Yeast UTI: Completed a course of fluconazole-per infectious disease   ? Seizure: Thought to be secondary to fever, and potential drug reaction from inhaled amikacin. No further seizures since then.EEG 03/13-without any seizure. Monitor and follow closely.  Chronic hypotension: Continue Midodrine-BP stable for now  Anemia: Likely secondary to acute illness on top of chronic disease. Hemoglobin stable, follow trend.  Thrombocytosis: Likely reflecting chronic inflammation-acute phase reactant. Follow.  History of gastroparesis: Likely secondary to diabetes, no vomiting-seem stable at present. Follow  Dysphagia: currently NPO-on peg tube feedings.   Severe protein calorie malnutrition: continue peg tube feeds  Stage IV sacral ulcers/stage III left heel ulcer/Full thickness wound mid back:resent prior to admission, continues to be followed wound care RN. Has a left lower quadrant colostomy in place  Type 1 diabetes: CBGs uncontrolled-but on steroids-we will increase Lantus to 16 units, continue SSI. We will try to allow some permissive hyperglycemia, as pt has hx of rapidly developing hypoglycemia when attempts are made to strictly control his CBG   Chronic pain syndrome: Secondary to extensive sacral ulcers-continue transdermal fentanyl/oxycodone. Agree with Dr. Garen Lah assessment of resuming judicious narcotics and antispasmodic medications to accomplish comfort. Unfortunate 30 year old is deteriorating slowly, and do not wish to see him  suffer at this time.  Anxiety: Continue Ativan as needed.  History of neurogenic bladder: Supra pubic catheter in place.  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. IVC filter placed 8/6. Review of prior documentation-not on A/C due to need for frequent debridements  which have caused extensive bleeding, previously on apixaban-on prophylactic Heparin currently   History of spinal cord infarct with paraplegia  Palliative care: Unfortunate 30 year old patient with prior spinal cord injury with resultant paraplegia-chronic tracheostomy-multiple infections in the past with multidrug resistant organisms-multiple allergies to numerous antibiotics-brought into the hospital for worsening hypoxic respiratory failure secondary to ventilator associated pneumonia from multidrug resistant Pseudomonas. Unfortunately given severe deconditioning, not be able to be weaned off the ventilator. Hospital course complicated by worsening sepsis and renal failure in spite of broad spectrum antibiotics. Family has had multiple discussions with critical care MDs, palliative care M.D.-currently he remains a full code. However family is aware of very poor overall prognoses.  Disposition: Remain inpatient-home when he completes IV antibiotics-likely next week.  Antimicrobial agents  See below  Anti-infectives    Start     Dose/Rate Route Frequency Ordered Stop   12/31/15 1300  vancomycin (VANCOCIN) 500 mg in sodium chloride 0.9 % 100 mL IVPB     500 mg 100 mL/hr over 60 Minutes Intravenous Every 24 hours 12/31/15 0901     12/26/15 1100  vancomycin (VANCOCIN) IVPB 750 mg/150 ml premix  Status:  Discontinued     750 mg 150 mL/hr over 60 Minutes Intravenous Every 48 hours 12/26/15 1014 12/31/15 0901   12/26/15 1100  ceFEPIme (MAXIPIME) 1 g in dextrose 5 % 50 mL IVPB     1 g 100 mL/hr over 30 Minutes Intravenous Every 12 hours 12/26/15 1014     12/25/15 0830  vancomycin (VANCOCIN) 500 mg in sodium chloride 0.9 % 100 mL IVPB     500  mg 100 mL/hr over 60 Minutes Intravenous  Once 12/25/15 0816 12/25/15 1046   12/22/15 1200  amikacin (AMIKIN) injection 500 mg  Status:  Discontinued     500 mg Inhalation 2 times daily 12/22/15 0950 12/27/15 0030   12/21/15 1030  vancomycin (VANCOCIN) 500 mg in sodium chloride 0.9 % 100 mL IVPB  Status:  Discontinued     500 mg 100 mL/hr over 60 Minutes Intravenous Every 24 hours 12/20/15 1156 12/23/15 0954   12/20/15 2300  vancomycin (VANCOCIN) 500 mg in sodium chloride 0.9 % 100 mL IVPB  Status:  Discontinued     500 mg 100 mL/hr over 60 Minutes Intravenous Every 12 hours 12/20/15 1145 12/20/15 1156   12/20/15 0930  vancomycin (VANCOCIN) 1,250 mg in sodium chloride 0.9 % 250 mL IVPB     1,250 mg 166.7 mL/hr over 90 Minutes Intravenous  Once 12/20/15 0858 12/20/15 1211   12/15/15 1300  fluconazole (DIFLUCAN) tablet 100 mg  Status:  Discontinued     100 mg Oral Daily 12/15/15 1211 12/28/15 1129   12/15/15 1230  colistimethate (COLYMYCIN) 137.25 mg in sodium chloride 0.9 % 100 mL IVPB  Status:  Discontinued     5 mg/kg/day  54.9 kg 203.7 mL/hr over 30 Minutes Intravenous Every 12 hours 12/15/15 1211 12/19/15 1147   12/13/15 2200  tigecycline (TYGACIL) 50 mg in sodium chloride 0.9 % 100 mL IVPB  Status:  Discontinued     50 mg 200 mL/hr over 30 Minutes Intravenous Every 12 hours 12/13/15 0856 12/15/15 1211   12/13/15 1000  tigecycline (TYGACIL) 100 mg in sodium chloride 0.9 % 100 mL IVPB     100 mg 200 mL/hr over 30 Minutes Intravenous  Once 12/13/15 0856 12/13/15 1117   12/13/15 1000  aztreonam (AZACTAM) 2 g in dextrose 5 % 50 mL IVPB  Status:  Discontinued  2 g 100 mL/hr over 30 Minutes Intravenous 3 times per day 12/13/15 0856 12/15/15 1211   12/07/15 0600  imipenem-cilastatin (PRIMAXIN) 500 mg in sodium chloride 0.9 % 100 mL IVPB  Status:  Discontinued     500 mg 200 mL/hr over 30 Minutes Intravenous 4 times per day 12/06/15 2311 12/06/15 2329   12/07/15 0000  levofloxacin  (LEVAQUIN) IVPB 750 mg  Status:  Discontinued     750 mg 100 mL/hr over 90 Minutes Intravenous Every 24 hours 12/06/15 2339 12/13/15 0856   12/06/15 2315  vancomycin (VANCOCIN) 500 mg in sodium chloride 0.9 % 100 mL IVPB  Status:  Discontinued     500 mg 100 mL/hr over 60 Minutes Intravenous Every 12 hours 12/06/15 2311 12/13/15 0856   12/06/15 2230  imipenem-cilastatin (PRIMAXIN) 500 mg in sodium chloride 0.9 % 100 mL IVPB     500 mg 200 mL/hr over 30 Minutes Intravenous  Once 12/06/15 2209 12/06/15 2335      DVT Prophylaxis: Prophylactic  Heparin   Code Status: Full code   Family Communication Mother/Father at bedside  Procedures: Chronic trach / PEG >> Suprapubic catheter changed 3/8  CONSULTS:  pulmonary/intensive care, ID and Palliative care  Time spent 20 minutes-Greater than 50% of this time was spent in counseling, explanation of diagnosis, planning of further management, and coordination of care.  MEDICATIONS: Scheduled Meds: . acetaminophen (TYLENOL) oral liquid 160 mg/5 mL  500 mg Per Tube TID  . antiseptic oral rinse  7 mL Mouth Rinse QID  . baclofen  5 mg Per Tube QID  . buPROPion  150 mg Oral Daily  . ceFEPime (MAXIPIME) IV  1 g Intravenous Q12H  . chlorhexidine gluconate (SAGE KIT)  15 mL Mouth Rinse BID  . famotidine (PEPCID) IV  20 mg Intravenous Q12H  . fentaNYL  50 mcg Transdermal Q72H  . free water  200 mL Per Tube Q6H  . heparin  5,000 Units Subcutaneous 3 times per day  . insulin aspart  0-20 Units Subcutaneous 6 times per day  . insulin glargine  16 Units Subcutaneous Daily  . LORazepam  0.5 mg Per Tube TID  . methylPREDNISolone (SOLU-MEDROL) injection  20 mg Intravenous Q12H  . midodrine  5 mg Per Tube TID AC  . mupirocin cream   Topical Daily  . naLOXone (NARCAN)  injection  0.4 mg Intramuscular Once  . vancomycin  500 mg Intravenous Q24H   Continuous Infusions: . sodium chloride 10 mL/hr (12/30/15 0919)  . feeding supplement (GLUCERNA  1.2 CAL) 1,000 mL (12/30/15 2014)   PRN Meds:.acetaminophen, albuterol, dextrose, EPINEPHrine, hydrALAZINE, LORazepam, oxyCODONE, sodium chloride flush    PHYSICAL EXAM: Vital signs in last 24 hours: Filed Vitals:   12/31/15 0600 12/31/15 0739 12/31/15 0800 12/31/15 0815  BP: 91/74 91/74 137/89   Pulse: 87 100 95   Temp:    99.9 F (37.7 C)  TempSrc:    Oral  Resp: 18 32 23   Height:      Weight:      SpO2: 100% 100% 100%     Weight change: -0.181 kg (-6.4 oz) Filed Weights   12/29/15 0500 12/30/15 0500 12/31/15 0452  Weight: 54.114 kg (119 lb 4.8 oz) 54.25 kg (119 lb 9.6 oz) 54.069 kg (119 lb 3.2 oz)   Body mass index is 18.13 kg/(m^2).   Gen Exam: Awake and alert this morning.  Neck: Supple   Chest: Some rales on the right side-but mostly clear to auscultation  anteriorly.  CVS: S1 S2 Regular  Abdomen: soft, BS +, non tender, PEG tube in place, suprapubic catheter in place. Left lower quadrant colostomy in place Extremities: no edema, lower extremities warm to touch.   Intake/Output from previous day:  Intake/Output Summary (Last 24 hours) at 12/31/15 1108 Last data filed at 12/31/15 0800  Gross per 24 hour  Intake 2551.83 ml  Output   1800 ml  Net 751.83 ml     LAB RESULTS: CBC  Recent Labs Lab 12/26/15 2116 12/27/15 0528 12/29/15 0945 12/30/15 0445 12/31/15 0400  WBC 27.9* 32.0* 38.7* 33.2* 34.1*  HGB 7.8* 7.9* 8.2* 8.5* 8.6*  HCT 25.7* 26.3* 27.2* 28.9* 27.7*  PLT 545* 575* 655* 725* 729*  MCV 80.3 80.7 82.2 81.9 81.7  MCH 24.4* 24.2* 24.8* 24.1* 25.4*  MCHC 30.4 30.0 30.1 29.4* 31.0  RDW 15.9* 16.2* 17.0* 17.3* 17.3*  LYMPHSABS  --   --   --  1.7  --   MONOABS  --   --   --  1.3*  --   EOSABS  --   --   --  0.0  --   BASOSABS  --   --   --  0.0  --     Chemistries   Recent Labs Lab 12/26/15 0907 12/27/15 0528 12/29/15 0945 12/30/15 0445 12/31/15 0400  NA  --  136 138 132* 136  K  --  4.0 4.3 5.4* 5.4*  CL  --  105 107 103 107    CO2  --  21* 21* 19* 19*  GLUCOSE  --  331* 241* 453* 343*  BUN  --  54* 69* 67* 62*  CREATININE 2.30* 2.11* 1.72* 1.61* 1.39*  CALCIUM  --  10.4* 10.2 9.8 9.8  MG  --  1.7  --   --   --     CBG:  Recent Labs Lab 12/30/15 1822 12/30/15 2028 12/31/15 0020 12/31/15 0355 12/31/15 0804  GLUCAP 212* 168* 153* 315* 264*    GFR Estimated Creatinine Clearance: 58.9 mL/min (by C-G formula based on Cr of 1.39).  Coagulation profile No results for input(s): INR, PROTIME in the last 168 hours.  Cardiac Enzymes No results for input(s): CKMB, TROPONINI, MYOGLOBIN in the last 168 hours.  Invalid input(s): CK  Invalid input(s): POCBNP No results for input(s): DDIMER in the last 72 hours. No results for input(s): HGBA1C in the last 72 hours. No results for input(s): CHOL, HDL, LDLCALC, TRIG, CHOLHDL, LDLDIRECT in the last 72 hours. No results for input(s): TSH, T4TOTAL, T3FREE, THYROIDAB in the last 72 hours.  Invalid input(s): FREET3 No results for input(s): VITAMINB12, FOLATE, FERRITIN, TIBC, IRON, RETICCTPCT in the last 72 hours. No results for input(s): LIPASE, AMYLASE in the last 72 hours.  Urine Studies No results for input(s): UHGB, CRYS in the last 72 hours.  Invalid input(s): UACOL, UAPR, USPG, UPH, UTP, UGL, UKET, UBIL, UNIT, UROB, ULEU, UEPI, UWBC, URBC, UBAC, CAST, UCOM, BILUA  MICROBIOLOGY: Recent Results (from the past 240 hour(s))  Culture, respiratory (NON-Expectorated)     Status: None   Collection Time: 12/24/15  1:45 PM  Result Value Ref Range Status   Specimen Description TRACHEAL ASPIRATE  Final   Special Requests Normal  Final   Gram Stain   Final    ABUNDANT WBC PRESENT, PREDOMINANTLY PMN FEW SQUAMOUS EPITHELIAL CELLS PRESENT NO ORGANISMS SEEN THIS SPECIMEN IS ACCEPTABLE FOR SPUTUM CULTURE Performed at [facility]    Culture   Final  RARE METHICILLIN RESISTANT STAPHYLOCOCCUS AUREUS 22 Note: RIFAMPIN AND GENTAMICIN SHOULD NOT BE USED AS  SINGLE DRUGS FOR TREATMENT OF STAPH INFECTIONS. CRITICAL RESULT CALLED TO, READ BACK BY AND VERIFIED WITH: TROYCE H. 3 17 @ 0821 BY PARDA Performed at [facility]    Report Status 12/28/2015 FINAL  Final   Organism ID, Bacteria METHICILLIN RESISTANT STAPHYLOCOCCUS AUREUS  Final      Susceptibility   Methicillin resistant staphylococcus aureus - MIC*    CLINDAMYCIN >=8 RESISTANT Resistant     ERYTHROMYCIN >=8 RESISTANT Resistant     GENTAMICIN <=0.5 SENSITIVE Sensitive     LEVOFLOXACIN >=8 RESISTANT Resistant     OXACILLIN >=4 RESISTANT Resistant     RIFAMPIN <=0.5 SENSITIVE Sensitive     TRIMETH/SULFA >=320 RESISTANT Resistant     VANCOMYCIN 1 SENSITIVE Sensitive     TETRACYCLINE >=16 RESISTANT Resistant     * RARE METHICILLIN RESISTANT STAPHYLOCOCCUS AUREUS  Culture, blood (routine x 2)     Status: None   Collection Time: 12/25/15  6:46 PM  Result Value Ref Range Status   Specimen Description BLOOD LEFT ARM  Final   Special Requests IN PEDIATRIC BOTTLE 3CC  Final   Culture NO GROWTH 5 DAYS  Final   Report Status 12/30/2015 FINAL  Final  Culture, blood (routine x 2)     Status: None   Collection Time: 12/25/15  6:52 PM  Result Value Ref Range Status   Specimen Description BLOOD LEFT ARM  Final   Special Requests IN PEDIATRIC BOTTLE 3CC  Final   Culture NO GROWTH 5 DAYS  Final   Report Status 12/30/2015 FINAL  Final    RADIOLOGY STUDIES/RESULTS: Ct Abdomen Pelvis Wo Contrast  12/10/2015  CLINICAL DATA:  Paralyzed from accident. Rule out fistula from bladder to ostomy. Severe case of MRSA. Decubitus ulcers to back and buttocks. Oral contrast only due to inadequate IV access. EXAM: CT ABDOMEN AND PELVIS WITHOUT CONTRAST TECHNIQUE: Multidetector CT imaging of the abdomen and pelvis was performed following the standard protocol without IV contrast. COMPARISON:  None. FINDINGS: Lower chest: There are bibasilar consolidations which could represent atelectasis or pneumonia. There  are also small bilateral pleural effusions. Hepatobiliary: No mass visualized on this un-enhanced exam. Pancreas: No mass or inflammatory process identified on this un-enhanced exam. Spleen: Within normal limits in size. Adrenals/Urinary Tract: Bladder walls appear thickened. Suprapubic catheter in place without obvious complicating feature. Kidneys are unremarkable without stone or hydronephrosis. Stomach/Bowel: Gastrostomy tube in place. Soft tissue defect of the left lower abdominal wall, presumed colostomy. No large or small bowel dilatation. Vascular/Lymphatic: No pathologically enlarged lymph nodes. No evidence of abdominal aortic aneurysm. IVC filter in place, well-positioned just below the level of the renal veins. Reproductive: No mass or other significant abnormality. Other: Small amount of free fluid within the right paracolic gutter and left lower quadrant. No abscess collection identified within the abdomen or pelvis. No free intraperitoneal air. No obvious fistula between the bladder and left lower abdominal ostomy site. Musculoskeletal: Ill-defined edema throughout the subcutaneous soft tissues suggesting anasarca. Soft tissue defects within the bilateral buttocks region, compatible with the given history of decubitus ulcers. Additional large soft tissue defect overlying the sacrum, again compatible with the given history of decubitus ulcer. Underlying the decubitus ulcer sites, there are fairly well-defined cortical defects within the posterior right iliac bone and posterior sacrum. IMPRESSION: 1. Circumferential thickening of the walls of the bladder suspicious for cystitis. Recommend correlation with urinalysis. Suprapubic catheter  in place. 2. Left lower abdominal wall defect, presumed colostomy. No fistulous connection identified between the bladder and this presumed colostomy. If clinical concern for fistula persists, could consider CT cystogram by injecting contrast into the bladder through the  indwelling suprapubic catheter. 3. Small amount of ascites. No intra-abdominal or intrapelvic abscess collection seen. No free intraperitoneal air. 4. Decubitus ulcers of the bilateral buttocks regions and overlying the sacrum. 5. Cortical defects along the posterior margins of the right iliac bone and posterior sacrum, somewhat well-defined, immediately underlying the sites of the decubitus ulcers. I suspect that these are chronic osseous erosions related to the overlying decubitus ulcers. Alternatively, these could be postsurgical changes performed in conjunction with decubitus ulcer debridements if surgical history correlates. Destructive changes of osteomyelitis is certainly an additional possibility. MRI may be helpful for further characterization. 6. Bibasilar consolidations, atelectasis versus pneumonia, with small adjacent pleural effusions. These results will be called to the ordering clinician or representative by the Radiologist Assistant, and communication documented in the PACS or zVision Dashboard. Electronically Signed   By: Franki Cabot M.D.   On: 12/10/2015 18:50   Dg Chest Port 1 View  12/29/2015  CLINICAL DATA:  Tracheostomy patient, healthcare associated pneumonia old with acute and chronic respiratory failure EXAM: PORTABLE CHEST 1 VIEW COMPARISON:  Portable chest x-ray of December 23, 2015 FINDINGS: The lungs are well-expanded. There has been marked improvement in the appearance of the infiltrate in the right lung. Mild interstitial prominence persists in the mid and lower lung. There is no pleural effusion. The left lung is clear. The heart and pulmonary vascularity are normal. The tracheostomy appliance tip lies at the inferior margin of the clavicular heads which is 5.8 cm above the carina. The right PICC line catheter tip projects over the distal SVC. IMPRESSION: Ongoing improvement of right-sided pneumonia. There is no significant pleural effusion nor evidence of CHF. Electronically  Signed   By: David  Martinique M.D.   On: 12/29/2015 07:27   Portable Chest Xray  12/23/2015  CLINICAL DATA:  Pneumonia EXAM: PORTABLE CHEST 1 VIEW COMPARISON:  12/22/2015 FINDINGS: Right PICC and tracheostomy tube are stable. Bilateral airspace disease is stable. There is partial collapse of the right lower lobe which is stable. No pneumothorax. IMPRESSION: Stable bilateral airspace disease. Stable partial collapse of the right lower lobe. Electronically Signed   By: Marybelle Killings M.D.   On: 12/23/2015 08:14   Dg Chest Port 1 View  12/22/2015  CLINICAL DATA:  Dyspnea.  Quadriplegia. EXAM: PORTABLE CHEST 1 VIEW COMPARISON:  12/20/2015 FINDINGS: Support apparatus stable. Increased right perihilar and basilar airspace opacity. The patient is rotated to the right on today's radiograph, reducing diagnostic sensitivity and specificity. Deformity of the right distal clavicle, stable. Improved aeration at the left lung base. IMPRESSION: 1. Significant worsening of airspace opacity in the right mid lung and right lung base, nonspecific but potentially from pneumonia. However, there is some improvement in the airspace opacity at the left lung base. Electronically Signed   By: Van Clines M.D.   On: 12/22/2015 09:38   Dg Chest Port 1 View  12/20/2015  CLINICAL DATA:  Respiratory failure EXAM: PORTABLE CHEST 1 VIEW COMPARISON:  December 15, 2015 FINDINGS: Tracheostomy catheter tip is 4.8 cm above the carina. Central catheter tip is in the superior vena cava near the cavoatrial junction. There is no appreciable pneumothorax. There is airspace consolidation in the medial right base with small right effusion. Left lung is clear. Heart is upper  normal in size with pulmonary vascularity within normal limits. No adenopathy. There is lumbar levoscoliosis. IMPRESSION: Tube and catheter positions as described without pneumothorax. Consolidation medial right base with small right effusion. Left lung clear. No change in cardiac  silhouette. Electronically Signed   By: Lowella Grip III M.D.   On: 12/20/2015 07:37   Dg Chest Port 1 View  12/15/2015  CLINICAL DATA:  Followup exam.  Pneumonia. EXAM: PORTABLE CHEST 1 VIEW COMPARISON:  12/13/2015 FINDINGS: Persistent lung base opacity noted consistent with combination of pleural fluid and atelectasis. Pneumonia not excluded. No evidence of pulmonary edema. No pneumothorax. Heart, mediastinum hila are unremarkable. Tracheostomy tube is stable in well positioned. IMPRESSION: 1. No change from the previous exam. 2. Persistent lower lung zone opacity consistent with a combination pleural fluid and probable atelectasis. Pneumonia is possible. Electronically Signed   By: Lajean Manes M.D.   On: 12/15/2015 10:10   Dg Chest Port 1 View  12/13/2015  CLINICAL DATA:  Pneumonia. EXAM: PORTABLE CHEST 1 VIEW COMPARISON:  December 09, 2015. FINDINGS: Stable cardiomediastinal silhouette. No pneumothorax is noted. Mildly increased bilateral pleural effusions are noted with probable associated atelectasis. Tracheostomy tube is unchanged in position. Bony thorax is unremarkable. IMPRESSION: Mildly increased bibasilar opacities are noted most consistent with subsegmental atelectasis and associated pleural effusions. Electronically Signed   By: Marijo Conception, M.D.   On: 12/13/2015 10:16   Dg Chest Port 1 View  12/09/2015  CLINICAL DATA:  Pneumonia. EXAM: PORTABLE CHEST 1 VIEW COMPARISON:  12/08/2015. FINDINGS: Tracheostomy to in stable position. Mediastinum hilar structures normal. Low lung volumes with persistent bibasilar atelectasis and/or infiltrates again noted. Small pleural effusions again noted. No pneumothorax. IMPRESSION: 1. Tracheostomy tube in stable position. 2. Low lung volumes with persistent bibasilar atelectasis and/or infiltrates. Persistent bilateral pleural effusions. Similar findings noted on prior exam. Electronically Signed   By: [place]   On: 12/09/2015 07:20   Dg Chest  Port 1 View  12/08/2015  CLINICAL DATA:  Acute respiratory failure. EXAM: PORTABLE CHEST 1 VIEW COMPARISON:  12/07/2015. FINDINGS: Tracheostomy tube in stable position. Heart size stable. Persistent bibasilar atelectasis and/or infiltrates. Small left pleural effusion. No pneumothorax. Right costophrenic angle not imaged. IMPRESSION: 1. Tracheostomy tube in stable position. 2. Persistent bibasilar atelectasis and/or infiltrates without interim clearing. Small left pleural effusion again noted. Electronically Signed   By: Marcello Moores  Register   On: 12/08/2015 07:09   Dg Chest Port 1 View  12/07/2015  CLINICAL DATA:  Acute onset of respiratory failure. Initial encounter. EXAM: PORTABLE CHEST 1 VIEW COMPARISON:  Chest radiograph performed 12/06/2015 FINDINGS: The patient's tracheostomy tube is seen ending 5 cm above the carina. Bibasilar airspace opacities may reflect pneumonia or pulmonary edema. Small bilateral pleural effusions are noted. No pneumothorax is seen. The cardiomediastinal silhouette remains normal in size. No acute osseous abnormalities are identified. IMPRESSION: Bibasilar airspace opacities may reflect pneumonia or pulmonary edema. Small bilateral pleural effusions noted. Electronically Signed   By: Garald Balding M.D.   On: 12/07/2015 06:43   Dg Chest Portable 1 View  12/06/2015  CLINICAL DATA:  30 year old male with sepsis and decreased O2 sats. 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 There are bibasilar opacities with silhouetting of the diaphragm, concerning for pneumonia. There has been interval increase in the left lung base density compared to the prior study. Small bilateral pleural effusions may be present. The cardiac silhouette is  within normal limits. No acute osseous pathology. IMPRESSION: Bibasilar airspace densities concerning for pneumonia. Clinical correlation and follow-up  recommended. Electronically Signed   By: Anner Crete M.D.   On: 12/06/2015 19:59   Dg Abd Portable 1v  12/18/2015  CLINICAL DATA:  Acute onset of abdominal distention. Initial encounter. EXAM: PORTABLE ABDOMEN - 1 VIEW COMPARISON:  CT of the abdomen and pelvis from 12/10/2015 FINDINGS: The visualized bowel gas pattern is unremarkable. Scattered air and stool filled loops of colon are seen; no abnormal dilatation of small bowel loops is seen to suggest small bowel obstruction. No free intra-abdominal air is identified, though evaluation for free air is limited on a single supine view. The stomach is partially filled with air. An IVC filter is noted. The visualized osseous structures are within normal limits; the sacroiliac joints are unremarkable in appearance. The visualized lung bases are essentially clear. IMPRESSION: Unremarkable bowel gas pattern; no free intra-abdominal air seen. Moderate amount of stool noted in the colon. Stomach partially filled with air. Electronically Signed   By: Garald Balding M.D.   On: 12/18/2015 21:10    Oren Binet, MD  [facility] Pager:[phone]  If 7PM-7AM, please contact night-coverage [email or web address] Password [email or web address] 12/31/2015, 11:08 AM   LOS: 25 days

## 2015-12-31 NOTE — Progress Notes (Signed)
Patient is currently on continuous tube feeding.  He is not getting any meal/tray. His current order is only for 12 hours, 12 midnight to 12 noon.  Pt's mother at bedside and she also stated that this order is an old order. Will address this matter with MD in AM.

## 2015-12-31 NOTE — Progress Notes (Signed)
Pharmacy Antibiotic Note  70 YOM with MDR pseudomonas PNA and CoNS bacteremia.  Pharmacy managing Vancomycin and Cefepime.  Patient's allergic reaction to cefuroxime is anaphylaxis; however, cefuroxime and cefepime are not structurally similar.  Mother is aware of risk per MD. Currently tolerating Cefepime with premedications. Wbc remains elevated at 34.1.   His renal function has continued to improve today. SCr down 2.11>>1.72>>1.61>>1.39, UOP 0.7 mL/kg/hr.    Plan: - Increase Vancomycin to 500mg  IV Q24H. Collect VT at steady state.  - Monitor renal fxn, clinical progress - Increase Cefepime to 1gm IV Q 12 H.  Premedicate with Solu-Medrol 20mg  IV and Pepcid 20mg  IV 30 min prior to med administration.  PRN albuterol nebs Q4H PRN as already ordered.  Epinephrine IV PRN for anaphylaxis.    Height: 5\' 8"  (172.7 cm) Weight: 119 lb 3.2 oz (54.069 kg) IBW/kg (Calculated) : 68.4  Temp (24hrs), Avg:98.9 F (37.2 C), Min:97.9 F (36.6 C), Max:100.2 F (37.9 C)   Recent Labs Lab 12/25/15 0500  12/26/15 0907 12/26/15 2116 12/27/15 0528 12/29/15 0945 12/30/15 0445 12/30/15 1337 12/31/15 0400  WBC  --   < > 21.8* 27.9* 32.0* 38.7* 33.2*  --  34.1*  CREATININE  --   --  2.30*  --  2.11* 1.72* 1.61*  --  1.39*  VANCOTROUGH  --   --   --   --   --   --   --  15  --   VANCORANDOM 15  --  17  --   --   --   --   --   --   < > = values in this interval not displayed.  Estimated Creatinine Clearance: 58.9 mL/min (by C-G formula based on Cr of 1.39).    Allergies  Allergen Reactions  . Cefuroxime Axetil Anaphylaxis    Tolerated cefepime 12/2015 - with Pepcid/Solu-Medrol  . Ertapenem Other (See Comments)    Rash and confusion-->tolerated Imipenem   . Morphine And Related Other (See Comments)    Changed mental status, confusion, headache, visual hallucination  . Penicillins Anaphylaxis and Other (See Comments)    Tolerated Imipenem; no reaction to 7 day course of amoxicillin in 2015 Has  patient had a PCN reaction causing immediate rash, facial/tongue/throat swelling, SOB or lightheadedness with hypotension: Yes Has patient had a PCN reaction causing severe rash involving mucus membranes or skin necrosis: No Has patient had a PCN reaction that required hospitalization Yes Has patient had a PCN reaction occurring within the last 10 years: No If all of the above answers are "NO", then may proceed with Cephalo  . Sulfa Antibiotics Anaphylaxis, Shortness Of Breath and Other (See Comments)  . Tessalon [Benzonatate] Anaphylaxis  . Shellfish Allergy Itching and Other (See Comments)    Took benadryl to alleviate reaction  . Levaquin [Levofloxacin In D5w] Swelling    Hand and forearm swelling  . Nsaids Itching and Other (See Comments)    Risk of bleeding, itching  . Miripirium Rash and Other (See Comments)    Change in mental status    Antimicrobials this admission: Vanc 3/1 >> 3/7, 3/14 >> Levaquin 3/1 >> 3/7 Primaxin x 1 2/28 Aztreonam 3/7 >> 3/9 Tigecycline 3/7 >> 3/9 Colistin 3/9 >> 3/13 Fluc 3/9 >> Amikacin 3/16 >> Cefepime 3/20 >>  Dose adjustments this admission: 3/2 VT = 19 mcg/mL 3/17 0530 VR = 35 on 500mg  q24 (obtained prior to 3rd dose) 3/19 0500 VR = 15, gave vanc 500mg  x1 3/20 0900 VR =  17 mcg/mL >> vanc 750mg  q48 (SCr 2.3) 3/24 1030 VT = 15 >> continue Vanc 750 mg IV Q78f  Microbiology results: 2/28 UCx >> mult species FINAL 2/28 BCx x2>> ngF 3/1 BCx x2>> ngF 3/1 MRSA PCR >> positive 3/6 TA - Pseudomonas 3/6  blood x 2:  NF 3/7  Urine:  > 100K yeast 3/13 C.diff: neg 3/12 blood cx: CoNS - pending 3/14 blood cx: NegF 3/19 BCx x2 - NegF    Albertina Parr, PharmD., BCPS Clinical Pharmacist Pager [phone]

## 2016-01-01 LAB — GLUCOSE, CAPILLARY
GLUCOSE-CAPILLARY: 160 mg/dL — AB (ref 65–99)
GLUCOSE-CAPILLARY: 271 mg/dL — AB (ref 65–99)
Glucose-Capillary: 378 mg/dL — ABNORMAL HIGH (ref 65–99)
Glucose-Capillary: 203 mg/dL — ABNORMAL HIGH (ref 65–99)
Glucose-Capillary: 188 mg/dL — ABNORMAL HIGH (ref 65–99)
Glucose-Capillary: 403 mg/dL — ABNORMAL HIGH (ref 65–99)

## 2016-01-01 LAB — CBC WITH DIFFERENTIAL/PLATELET
BASOS PCT: 0 %
Basophils Absolute: 0 10*3/uL (ref 0.0–0.1)
EOS ABS: 0 10*3/uL (ref 0.0–0.7)
Eosinophils Relative: 0 %
HCT: 28.3 % — ABNORMAL LOW (ref 39.0–52.0)
Hemoglobin: 8.4 g/dL — ABNORMAL LOW (ref 13.0–17.0)
Lymphocytes Relative: 6 %
Lymphs Abs: 1.9 10*3/uL (ref 0.7–4.0)
MCH: 24.1 pg — AB (ref 26.0–34.0)
MCHC: 29.7 g/dL — AB (ref 30.0–36.0)
MCV: 81.3 fL (ref 78.0–100.0)
MONO ABS: 0.6 10*3/uL (ref 0.1–1.0)
Monocytes Relative: 2 %
NEUTROS PCT: 92 %
Neutro Abs: 29.2 10*3/uL — ABNORMAL HIGH (ref 1.7–7.7)
PLATELETS: 843 10*3/uL — AB (ref 150–400)
RBC: 3.48 MIL/uL — ABNORMAL LOW (ref 4.22–5.81)
RDW: 17.3 % — AB (ref 11.5–15.5)
WBC: 31.7 10*3/uL — AB (ref 4.0–10.5)

## 2016-01-01 LAB — BASIC METABOLIC PANEL
Anion gap: 8 (ref 5–15)
BUN: 58 mg/dL — AB (ref 6–20)
CO2: 21 mmol/L — ABNORMAL LOW (ref 22–32)
CREATININE: 1.22 mg/dL (ref 0.61–1.24)
Calcium: 9.6 mg/dL (ref 8.9–10.3)
Chloride: 101 mmol/L (ref 101–111)
GFR calc Af Amer: >60 mL/min (ref >60)
Glucose, Bld: 445 mg/dL — ABNORMAL HIGH (ref 65–99)
POTASSIUM: 5.8 mmol/L — AB (ref 3.5–5.1)
SODIUM: 130 mmol/L — AB (ref 135–145)

## 2016-01-01 MED ORDER — SODIUM POLYSTYRENE SULFONATE 15 GM/60ML PO SUSP
30.0000 g | Freq: Once | ORAL | Status: AC
  Administered 2016-01-01: 30 g
  Filled 2016-01-01 (×2): qty 120

## 2016-01-01 NOTE — Progress Notes (Signed)
Speech Language Pathology Treatment:    Patient Details Name: Jason [name] MRN: [id number] DOB: Jun 12, 1986 Today's Date: 01/01/2016 Time:  -      Received order for swallow assessment. ST familiar with pt re: swallow and PMSV. Will assess tomorrow for swallow evaluation.    Orbie Pyo [place].[employer] [phone]

## 2016-01-01 NOTE — Progress Notes (Signed)
PATIENT DETAILS Name: Jason [name] Age: 30 y.o. 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 Continued to require ventilator support during this hospital stay-PCCM attempting to wean him off the ventilator-has been on a trach collar for the past 24 hours. Infectious disease directing antibiotic therapy-unfortunately patient has multiple allergies to antibiotics. Slowly improving.  Significant Events: 2/28 admit to [facility] by PCCM 3/2 no fevers, low secretions, vented, refusing Chest PT and other treatments 3/3 improved to TC, pcxr better 3/5 trach collar 24 Hrs 3/6 Tmax 103.2 3/8 still spiking temps  3/9 Colistin started  3/13 encephalopathic, rising creatinine, hyperkalemic 3/14 remains encephalopathic - 1/2 blood cx's with GPCs 3/16 unresponsive - resp pattern worse, agonal - added inhaled amikacin, placed back on vent 3/21-transferred to [facility]  Subjective: Awake and alert this morning. On trach collar-for the past 24 hours-and not requiring any ventilator support   Assessment/Plan: Acute on chronic hypoxic respiratory failure/tracheostomy dependent: Secondary to multidrug resistant Pseudomonas pneumonia and ?MRSA. PCCM following, and attempting to wean him off the ventilator-now able to be off the ventilator for the past 24 hours and is on a trach collar. Will continue to follow and await further recommendations from PCCM  Sepsis secondary to VAP/ MDR Pseudeomonas and MRSA O[facility] curve better, leukocytosis has started to downtrend. Given multiple allergies, drug  resistant bacteria-limited options. Infectious disease following an directing care. Currently on intravenous vancomycin and cefepime (premedication with steroids and Pepcid). Sepsis pathophysiology is slowly improving.   Acute renal failure: Likely secondary to prerenal azotemia-setting of sepsis. Improving with supportive care. Follow periodically  Hyperkalemia: Kayexalate 1, repeat I's tomorrow  Acute encephalopathy: Seems to have resolved, is awake and alert this morning. Likely secondary to sepsis and renal failure. Continue IV antibiotics.EEG 03/13-without any seizure-like activity, but moderate diffuse slowing. Follow.  Yeast UTI: Completed a course of fluconazole-per infectious disease   ? Seizure: Thought to be secondary to fever, and potential drug reaction from inhaled amikacin. No further seizures since then.EEG 03/13-without any seizure. Monitor and follow closely.  Chronic hypotension: Continue Midodrine-BP stable for now  Anemia: Likely secondary to acute illness on top of chronic disease. Hemoglobin stable, follow trend.  Thrombocytosis: Likely reflecting chronic inflammation-acute phase reactant. Follow.  History of gastroparesis: Likely secondary to diabetes, no vomiting-seem stable at present. Follow  Dysphagia: Kept NPO while on a ventilator-we will ask speech therapy to evaluate and start appropriate diet. Continue PEG tube feedings, will ask nutrition to see if we can resume nocturnal feeds.  Severe protein calorie malnutrition: continue peg tube feeds-see above  Stage IV sacral ulcers/stage III left heel ulcer/Full thickness wound mid back:resent prior to admission, continues to be followed wound care RN. Has a left lower quadrant colostomy in place  Type 1 diabetes: CBGs uncontrolled-but on steroids-continue Lantus to 16 units, continue SSI. We will try to allow some permissive hyperglycemia, as pt has hx of rapidly developing hypoglycemia when attempts are made to  strictly control his CBG   Chronic pain syndrome: Secondary to extensive sacral ulcers-continue transdermal fentanyl/oxycodone. Agree with Dr. Garen Lah assessment of resuming judicious narcotics and antispasmodic  medications to accomplish comfort. Unfortunate 30 year old is deteriorating slowly, and do not wish to see him suffer at this time.  Anxiety: Continue Ativan as needed.  History of neurogenic bladder: Supra pubic catheter in place.  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. IVC filter placed 8/6. Review of prior documentation-not on A/C due to need for frequent debridements which have caused extensive bleeding, previously on apixaban-on prophylactic Heparin currently   History of spinal cord infarct with paraplegia  Palliative care: Unfortunate 30 year old patient with prior spinal cord injury with resultant paraplegia-chronic tracheostomy-multiple infections in the past with multidrug resistant organisms-multiple allergies to numerous antibiotics-brought into the hospital for worsening hypoxic respiratory failure secondary to ventilator associated pneumonia from multidrug resistant Pseudomonas. Unfortunately given severe deconditioning, not be able to be weaned off the ventilator. Hospital course complicated by worsening sepsis and renal failure in spite of broad spectrum antibiotics. Family has had multiple discussions with critical care MDs, palliative care M.D.-currently he remains a full code. However family is aware of very poor overall prognoses.  Disposition: Remain inpatient-home when he completes IV antibiotics-likely next week.  Antimicrobial agents  See below  Anti-infectives    Start     Dose/Rate Route Frequency Ordered Stop   12/31/15 1300  vancomycin (VANCOCIN) 500 mg in sodium chloride 0.9 % 100 mL IVPB     500 mg 100 mL/hr over 60 Minutes Intravenous Every 24 hours 12/31/15 0901     12/26/15 1100  vancomycin (VANCOCIN) IVPB 750 mg/150 ml premix  Status:  Discontinued     750  mg 150 mL/hr over 60 Minutes Intravenous Every 48 hours 12/26/15 1014 12/31/15 0901   12/26/15 1100  ceFEPIme (MAXIPIME) 1 g in dextrose 5 % 50 mL IVPB     1 g 100 mL/hr over 30 Minutes Intravenous Every 12 hours 12/26/15 1014     12/25/15 0830  vancomycin (VANCOCIN) 500 mg in sodium chloride 0.9 % 100 mL IVPB     500 mg 100 mL/hr over 60 Minutes Intravenous  Once 12/25/15 0816 12/25/15 1046   12/22/15 1200  amikacin (AMIKIN) injection 500 mg  Status:  Discontinued     500 mg Inhalation 2 times daily 12/22/15 0950 12/27/15 0030   12/21/15 1030  vancomycin (VANCOCIN) 500 mg in sodium chloride 0.9 % 100 mL IVPB  Status:  Discontinued     500 mg 100 mL/hr over 60 Minutes Intravenous Every 24 hours 12/20/15 1156 12/23/15 0954   12/20/15 2300  vancomycin (VANCOCIN) 500 mg in sodium chloride 0.9 % 100 mL IVPB  Status:  Discontinued     500 mg 100 mL/hr over 60 Minutes Intravenous Every 12 hours 12/20/15 1145 12/20/15 1156   12/20/15 0930  vancomycin (VANCOCIN) 1,250 mg in sodium chloride 0.9 % 250 mL IVPB     1,250 mg 166.7 mL/hr over 90 Minutes Intravenous  Once 12/20/15 0858 12/20/15 1211   12/15/15 1300  fluconazole (DIFLUCAN) tablet 100 mg  Status:  Discontinued     100 mg Oral Daily 12/15/15 1211 12/28/15 1129   12/15/15 1230  colistimethate (COLYMYCIN) 137.25 mg in sodium chloride 0.9 % 100 mL IVPB  Status:  Discontinued     5 mg/kg/day  54.9 kg 203.7 mL/hr over 30 Minutes Intravenous Every 12 hours 12/15/15 1211 12/19/15 1147   12/13/15 2200  tigecycline (TYGACIL) 50 mg in sodium chloride 0.9 % 100 mL IVPB  Status:  Discontinued     50 mg 200 mL/hr over 30 Minutes Intravenous Every 12 hours 12/13/15 0856 12/15/15  1211   12/13/15 1000  tigecycline (TYGACIL) 100 mg in sodium chloride 0.9 % 100 mL IVPB     100 mg 200 mL/hr over 30 Minutes Intravenous  Once 12/13/15 0856 12/13/15 1117   12/13/15 1000  aztreonam (AZACTAM) 2 g in dextrose 5 % 50 mL IVPB  Status:  Discontinued     2 g 100  mL/hr over 30 Minutes Intravenous 3 times per day 12/13/15 0856 12/15/15 1211   12/07/15 0600  imipenem-cilastatin (PRIMAXIN) 500 mg in sodium chloride 0.9 % 100 mL IVPB  Status:  Discontinued     500 mg 200 mL/hr over 30 Minutes Intravenous 4 times per day 12/06/15 2311 12/06/15 2329   12/07/15 0000  levofloxacin (LEVAQUIN) IVPB 750 mg  Status:  Discontinued     750 mg 100 mL/hr over 90 Minutes Intravenous Every 24 hours 12/06/15 2339 12/13/15 0856   12/06/15 2315  vancomycin (VANCOCIN) 500 mg in sodium chloride 0.9 % 100 mL IVPB  Status:  Discontinued     500 mg 100 mL/hr over 60 Minutes Intravenous Every 12 hours 12/06/15 2311 12/13/15 0856   12/06/15 2230  imipenem-cilastatin (PRIMAXIN) 500 mg in sodium chloride 0.9 % 100 mL IVPB     500 mg 200 mL/hr over 30 Minutes Intravenous  Once 12/06/15 2209 12/06/15 2335      DVT Prophylaxis: Prophylactic  Heparin   Code Status: Full code   Family Communication Mother at bedside  Procedures: Chronic trach / PEG >> Suprapubic catheter changed 3/8  CONSULTS:  pulmonary/intensive care, ID and Palliative care  Time spent 20 minutes-Greater than 50% of this time was spent in counseling, explanation of diagnosis, planning of further management, and coordination of care.  MEDICATIONS: Scheduled Meds: . acetaminophen (TYLENOL) oral liquid 160 mg/5 mL  500 mg Per Tube TID  . antiseptic oral rinse  7 mL Mouth Rinse QID  . baclofen  5 mg Per Tube QID  . buPROPion  150 mg Oral Daily  . ceFEPime (MAXIPIME) IV  1 g Intravenous Q12H  . chlorhexidine gluconate (SAGE KIT)  15 mL Mouth Rinse BID  . famotidine (PEPCID) IV  20 mg Intravenous Q12H  . fentaNYL  50 mcg Transdermal Q72H  . free water  200 mL Per Tube Q6H  . heparin  5,000 Units Subcutaneous 3 times per day  . insulin aspart  0-20 Units Subcutaneous 6 times per day  . insulin glargine  16 Units Subcutaneous Daily  . LORazepam  0.5 mg Per Tube TID  . methylPREDNISolone  (SOLU-MEDROL) injection  20 mg Intravenous Q12H  . midodrine  5 mg Per Tube TID AC  . mupirocin cream   Topical Daily  . naLOXone (NARCAN)  injection  0.4 mg Intramuscular Once  . vancomycin  500 mg Intravenous Q24H   Continuous Infusions: . sodium chloride 10 mL/hr (12/30/15 0919)  . feeding supplement (GLUCERNA 1.2 CAL) 1,000 mL (01/01/16 0244)   PRN Meds:.acetaminophen, albuterol, dextrose, diphenhydrAMINE, EPINEPHrine, hydrALAZINE, LORazepam, oxyCODONE, sodium chloride flush    PHYSICAL EXAM: Vital signs in last 24 hours: Filed Vitals:   01/01/16 0441 01/01/16 0817 01/01/16 0830 01/01/16 1129  BP:  120/81 120/81 125/88  Pulse:  73 75 77  Temp:  97.6 F (36.4 C)    TempSrc:  Oral    Resp:  18 16 23   Height:      Weight: 54.114 kg (119 lb 4.8 oz)     SpO2:  98% 100% 100%    Weight change: 0.045  kg (1.6 oz) Filed Weights   12/30/15 0500 12/31/15 0452 01/01/16 0441  Weight: 54.25 kg (119 lb 9.6 oz) 54.069 kg (119 lb 3.2 oz) 54.114 kg (119 lb 4.8 oz)   Body mass index is 18.14 kg/(m^2).   Gen Exam: Awake and alert this morning. On trach collar Neck: Supple   Chest: Clear to auscultation anteriorly CVS: S1 S2 Regular  Abdomen: soft, BS +, non tender, PEG tube in place, suprapubic catheter in place. Left lower quadrant colostomy in place Extremities: no edema, lower extremities warm to touch.   Intake/Output from previous day:  Intake/Output Summary (Last 24 hours) at 01/01/16 1134 Last data filed at 01/01/16 0900  Gross per 24 hour  Intake   2700 ml  Output   1900 ml  Net    800 ml     LAB RESULTS: CBC  Recent Labs Lab 12/27/15 0528 12/29/15 0945 12/30/15 0445 12/31/15 0400 01/01/16 0457  WBC 32.0* 38.7* 33.2* 34.1* 31.7*  HGB 7.9* 8.2* 8.5* 8.6* 8.4*  HCT 26.3* 27.2* 28.9* 27.7* 28.3*  PLT 575* 655* 725* 729* 843*  MCV 80.7 82.2 81.9 81.7 81.3  MCH 24.2* 24.8* 24.1* 25.4* 24.1*  MCHC 30.0 30.1 29.4* 31.0 29.7*  RDW 16.2* 17.0* 17.3* 17.3* 17.3*    LYMPHSABS  --   --  1.7  --  1.9  MONOABS  --   --  1.3*  --  0.6  EOSABS  --   --  0.0  --  0.0  BASOSABS  --   --  0.0  --  0.0    Chemistries   Recent Labs Lab 12/27/15 0528 12/29/15 0945 12/30/15 0445 12/31/15 0400 01/01/16 0457  NA 136 138 132* 136 130*  K 4.0 4.3 5.4* 5.4* 5.8*  CL 105 107 103 107 101  CO2 21* 21* 19* 19* 21*  GLUCOSE 331* 241* 453* 343* 445*  BUN 54* 69* 67* 62* 58*  CREATININE 2.11* 1.72* 1.61* 1.39* 1.22  CALCIUM 10.4* 10.2 9.8 9.8 9.6  MG 1.7  --   --   --   --     CBG:  Recent Labs Lab 12/31/15 1616 12/31/15 2012 01/01/16 01/01/16 0431 01/01/16 0815  GLUCAP 179* 161* 160* 378* 203*    GFR Estimated Creatinine Clearance: 67.1 mL/min (by C-G formula based on Cr of 1.22).  Coagulation profile No results for input(s): INR, PROTIME in the last 168 hours.  Cardiac Enzymes No results for input(s): CKMB, TROPONINI, MYOGLOBIN in the last 168 hours.  Invalid input(s): CK  Invalid input(s): POCBNP No results for input(s): DDIMER in the last 72 hours. No results for input(s): HGBA1C in the last 72 hours. No results for input(s): CHOL, HDL, LDLCALC, TRIG, CHOLHDL, LDLDIRECT in the last 72 hours. No results for input(s): TSH, T4TOTAL, T3FREE, THYROIDAB in the last 72 hours.  Invalid input(s): FREET3 No results for input(s): VITAMINB12, FOLATE, FERRITIN, TIBC, IRON, RETICCTPCT in the last 72 hours. No results for input(s): LIPASE, AMYLASE in the last 72 hours.  Urine Studies No results for input(s): UHGB, CRYS in the last 72 hours.  Invalid input(s): UACOL, UAPR, USPG, UPH, UTP, UGL, UKET, UBIL, UNIT, UROB, ULEU, UEPI, UWBC, URBC, UBAC, CAST, UCOM, BILUA  MICROBIOLOGY: Recent Results (from the past 240 hour(s))  Culture, respiratory (NON-Expectorated)     Status: None   Collection Time: 12/24/15  1:45 PM  Result Value Ref Range Status   Specimen Description TRACHEAL ASPIRATE  Final   Special Requests Normal  Final  Gram Stain    Final    ABUNDANT WBC PRESENT, PREDOMINANTLY PMN FEW SQUAMOUS EPITHELIAL CELLS PRESENT NO ORGANISMS SEEN THIS SPECIMEN IS ACCEPTABLE FOR SPUTUM CULTURE Performed at [facility]    Culture   Final    RARE METHICILLIN RESISTANT STAPHYLOCOCCUS AUREUS 22 Note: RIFAMPIN AND GENTAMICIN SHOULD NOT BE USED AS SINGLE DRUGS FOR TREATMENT OF STAPH INFECTIONS. CRITICAL RESULT CALLED TO, READ BACK BY AND VERIFIED WITH: TROYCE H. 3 17 @ 0821 BY PARDA Performed at [facility]    Report Status 12/28/2015 FINAL  Final   Organism ID, Bacteria METHICILLIN RESISTANT STAPHYLOCOCCUS AUREUS  Final      Susceptibility   Methicillin resistant staphylococcus aureus - MIC*    CLINDAMYCIN >=8 RESISTANT Resistant     ERYTHROMYCIN >=8 RESISTANT Resistant     GENTAMICIN <=0.5 SENSITIVE Sensitive     LEVOFLOXACIN >=8 RESISTANT Resistant     OXACILLIN >=4 RESISTANT Resistant     RIFAMPIN <=0.5 SENSITIVE Sensitive     TRIMETH/SULFA >=320 RESISTANT Resistant     VANCOMYCIN 1 SENSITIVE Sensitive     TETRACYCLINE >=16 RESISTANT Resistant     * RARE METHICILLIN RESISTANT STAPHYLOCOCCUS AUREUS  Culture, blood (routine x 2)     Status: None   Collection Time: 12/25/15  6:46 PM  Result Value Ref Range Status   Specimen Description BLOOD LEFT ARM  Final   Special Requests IN PEDIATRIC BOTTLE 3CC  Final   Culture NO GROWTH 5 DAYS  Final   Report Status 12/30/2015 FINAL  Final  Culture, blood (routine x 2)     Status: None   Collection Time: 12/25/15  6:52 PM  Result Value Ref Range Status   Specimen Description BLOOD LEFT ARM  Final   Special Requests IN PEDIATRIC BOTTLE 3CC  Final   Culture NO GROWTH 5 DAYS  Final   Report Status 12/30/2015 FINAL  Final    RADIOLOGY STUDIES/RESULTS: Ct Abdomen Pelvis Wo Contrast  12/10/2015  CLINICAL DATA:  Paralyzed from accident. Rule out fistula from bladder to ostomy. Severe case of MRSA. Decubitus ulcers to back and buttocks. Oral contrast only due to  inadequate IV access. EXAM: CT ABDOMEN AND PELVIS WITHOUT CONTRAST TECHNIQUE: Multidetector CT imaging of the abdomen and pelvis was performed following the standard protocol without IV contrast. COMPARISON:  None. FINDINGS: Lower chest: There are bibasilar consolidations which could represent atelectasis or pneumonia. There are also small bilateral pleural effusions. Hepatobiliary: No mass visualized on this un-enhanced exam. Pancreas: No mass or inflammatory process identified on this un-enhanced exam. Spleen: Within normal limits in size. Adrenals/Urinary Tract: Bladder walls appear thickened. Suprapubic catheter in place without obvious complicating feature. Kidneys are unremarkable without stone or hydronephrosis. Stomach/Bowel: Gastrostomy tube in place. Soft tissue defect of the left lower abdominal wall, presumed colostomy. No large or small bowel dilatation. Vascular/Lymphatic: No pathologically enlarged lymph nodes. No evidence of abdominal aortic aneurysm. IVC filter in place, well-positioned just below the level of the renal veins. Reproductive: No mass or other significant abnormality. Other: Small amount of free fluid within the right paracolic gutter and left lower quadrant. No abscess collection identified within the abdomen or pelvis. No free intraperitoneal air. No obvious fistula between the bladder and left lower abdominal ostomy site. Musculoskeletal: Ill-defined edema throughout the subcutaneous soft tissues suggesting anasarca. Soft tissue defects within the bilateral buttocks region, compatible with the given history of decubitus ulcers. Additional large soft tissue defect overlying the sacrum, again compatible with the given  history of decubitus ulcer. Underlying the decubitus ulcer sites, there are fairly well-defined cortical defects within the posterior right iliac bone and posterior sacrum. IMPRESSION: 1. Circumferential thickening of the walls of the bladder suspicious for cystitis.  Recommend correlation with urinalysis. Suprapubic catheter in place. 2. Left lower abdominal wall defect, presumed colostomy. No fistulous connection identified between the bladder and this presumed colostomy. If clinical concern for fistula persists, could consider CT cystogram by injecting contrast into the bladder through the indwelling suprapubic catheter. 3. Small amount of ascites. No intra-abdominal or intrapelvic abscess collection seen. No free intraperitoneal air. 4. Decubitus ulcers of the bilateral buttocks regions and overlying the sacrum. 5. Cortical defects along the posterior margins of the right iliac bone and posterior sacrum, somewhat well-defined, immediately underlying the sites of the decubitus ulcers. I suspect that these are chronic osseous erosions related to the overlying decubitus ulcers. Alternatively, these could be postsurgical changes performed in conjunction with decubitus ulcer debridements if surgical history correlates. Destructive changes of osteomyelitis is certainly an additional possibility. MRI may be helpful for further characterization. 6. Bibasilar consolidations, atelectasis versus pneumonia, with small adjacent pleural effusions. These results will be called to the ordering clinician or representative by the Radiologist Assistant, and communication documented in the PACS or zVision Dashboard. Electronically Signed   By: Franki Cabot M.D.   On: 12/10/2015 18:50   Dg Chest Port 1 View  12/29/2015  CLINICAL DATA:  Tracheostomy patient, healthcare associated pneumonia old with acute and chronic respiratory failure EXAM: PORTABLE CHEST 1 VIEW COMPARISON:  Portable chest x-ray of December 23, 2015 FINDINGS: The lungs are well-expanded. There has been marked improvement in the appearance of the infiltrate in the right lung. Mild interstitial prominence persists in the mid and lower lung. There is no pleural effusion. The left lung is clear. The heart and pulmonary vascularity  are normal. The tracheostomy appliance tip lies at the inferior margin of the clavicular heads which is 5.8 cm above the carina. The right PICC line catheter tip projects over the distal SVC. IMPRESSION: Ongoing improvement of right-sided pneumonia. There is no significant pleural effusion nor evidence of CHF. Electronically Signed   By: David  Martinique M.D.   On: 12/29/2015 07:27   Portable Chest Xray  12/23/2015  CLINICAL DATA:  Pneumonia EXAM: PORTABLE CHEST 1 VIEW COMPARISON:  12/22/2015 FINDINGS: Right PICC and tracheostomy tube are stable. Bilateral airspace disease is stable. There is partial collapse of the right lower lobe which is stable. No pneumothorax. IMPRESSION: Stable bilateral airspace disease. Stable partial collapse of the right lower lobe. Electronically Signed   By: Marybelle Killings M.D.   On: 12/23/2015 08:14   Dg Chest Port 1 View  12/22/2015  CLINICAL DATA:  Dyspnea.  Quadriplegia. EXAM: PORTABLE CHEST 1 VIEW COMPARISON:  12/20/2015 FINDINGS: Support apparatus stable. Increased right perihilar and basilar airspace opacity. The patient is rotated to the right on today's radiograph, reducing diagnostic sensitivity and specificity. Deformity of the right distal clavicle, stable. Improved aeration at the left lung base. IMPRESSION: 1. Significant worsening of airspace opacity in the right mid lung and right lung base, nonspecific but potentially from pneumonia. However, there is some improvement in the airspace opacity at the left lung base. Electronically Signed   By: Van Clines M.D.   On: 12/22/2015 09:38   Dg Chest Port 1 View  12/20/2015  CLINICAL DATA:  Respiratory failure EXAM: PORTABLE CHEST 1 VIEW COMPARISON:  December 15, 2015 FINDINGS: Tracheostomy catheter tip is  4.8 cm above the carina. Central catheter tip is in the superior vena cava near the cavoatrial junction. There is no appreciable pneumothorax. There is airspace consolidation in the medial right base with small right  effusion. Left lung is clear. Heart is upper normal in size with pulmonary vascularity within normal limits. No adenopathy. There is lumbar levoscoliosis. IMPRESSION: Tube and catheter positions as described without pneumothorax. Consolidation medial right base with small right effusion. Left lung clear. No change in cardiac silhouette. Electronically Signed   By: Lowella Grip III M.D.   On: 12/20/2015 07:37   Dg Chest Port 1 View  12/15/2015  CLINICAL DATA:  Followup exam.  Pneumonia. EXAM: PORTABLE CHEST 1 VIEW COMPARISON:  12/13/2015 FINDINGS: Persistent lung base opacity noted consistent with combination of pleural fluid and atelectasis. Pneumonia not excluded. No evidence of pulmonary edema. No pneumothorax. Heart, mediastinum hila are unremarkable. Tracheostomy tube is stable in well positioned. IMPRESSION: 1. No change from the previous exam. 2. Persistent lower lung zone opacity consistent with a combination pleural fluid and probable atelectasis. Pneumonia is possible. Electronically Signed   By: Lajean Manes M.D.   On: 12/15/2015 10:10   Dg Chest Port 1 View  12/13/2015  CLINICAL DATA:  Pneumonia. EXAM: PORTABLE CHEST 1 VIEW COMPARISON:  December 09, 2015. FINDINGS: Stable cardiomediastinal silhouette. No pneumothorax is noted. Mildly increased bilateral pleural effusions are noted with probable associated atelectasis. Tracheostomy tube is unchanged in position. Bony thorax is unremarkable. IMPRESSION: Mildly increased bibasilar opacities are noted most consistent with subsegmental atelectasis and associated pleural effusions. Electronically Signed   By: Marijo Conception, M.D.   On: 12/13/2015 10:16   Dg Chest Port 1 View  12/09/2015  CLINICAL DATA:  Pneumonia. EXAM: PORTABLE CHEST 1 VIEW COMPARISON:  12/08/2015. FINDINGS: Tracheostomy to in stable position. Mediastinum hilar structures normal. Low lung volumes with persistent bibasilar atelectasis and/or infiltrates again noted. Small pleural  effusions again noted. No pneumothorax. IMPRESSION: 1. Tracheostomy tube in stable position. 2. Low lung volumes with persistent bibasilar atelectasis and/or infiltrates. Persistent bilateral pleural effusions. Similar findings noted on prior exam. Electronically Signed   By: [place]   On: 12/09/2015 07:20   Dg Chest Port 1 View  12/08/2015  CLINICAL DATA:  Acute respiratory failure. EXAM: PORTABLE CHEST 1 VIEW COMPARISON:  12/07/2015. FINDINGS: Tracheostomy tube in stable position. Heart size stable. Persistent bibasilar atelectasis and/or infiltrates. Small left pleural effusion. No pneumothorax. Right costophrenic angle not imaged. IMPRESSION: 1. Tracheostomy tube in stable position. 2. Persistent bibasilar atelectasis and/or infiltrates without interim clearing. Small left pleural effusion again noted. Electronically Signed   By: Marcello Moores  Register   On: 12/08/2015 07:09   Dg Chest Port 1 View  12/07/2015  CLINICAL DATA:  Acute onset of respiratory failure. Initial encounter. EXAM: PORTABLE CHEST 1 VIEW COMPARISON:  Chest radiograph performed 12/06/2015 FINDINGS: The patient's tracheostomy tube is seen ending 5 cm above the carina. Bibasilar airspace opacities may reflect pneumonia or pulmonary edema. Small bilateral pleural effusions are noted. No pneumothorax is seen. The cardiomediastinal silhouette remains normal in size. No acute osseous abnormalities are identified. IMPRESSION: Bibasilar airspace opacities may reflect pneumonia or pulmonary edema. Small bilateral pleural effusions noted. Electronically Signed   By: Garald Balding M.D.   On: 12/07/2015 06:43   Dg Chest Portable 1 View  12/06/2015  CLINICAL DATA:  30 year old male with sepsis and decreased O2 sats. EXAM: PORTABLE CHEST 1 VIEW COMPARISON:  Radiograph dated 11/02/2015 FINDINGS: Tracheostomy remains in stable  positioning above the carina. There are bibasilar opacities with silhouetting of the diaphragm, concerning for pneumonia.  There has been interval increase in the left lung base density compared to the prior study. Small bilateral pleural effusions may be present. The cardiac silhouette is within normal limits. No acute osseous pathology. IMPRESSION: Bibasilar airspace densities concerning for pneumonia. Clinical correlation and follow-up recommended. Electronically Signed   By: Anner Crete M.D.   On: 12/06/2015 19:59   Dg Abd Portable 1v  12/18/2015  CLINICAL DATA:  Acute onset of abdominal distention. Initial encounter. EXAM: PORTABLE ABDOMEN - 1 VIEW COMPARISON:  CT of the abdomen and pelvis from 12/10/2015 FINDINGS: The visualized bowel gas pattern is unremarkable. Scattered air and stool filled loops of colon are seen; no abnormal dilatation of small bowel loops is seen to suggest small bowel obstruction. No free intra-abdominal air is identified, though evaluation for free air is limited on a single supine view. The stomach is partially filled with air. An IVC filter is noted. The visualized osseous structures are within normal limits; the sacroiliac joints are unremarkable in appearance. The visualized lung bases are essentially clear. IMPRESSION: Unremarkable bowel gas pattern; no free intra-abdominal air seen. Moderate amount of stool noted in the colon. Stomach partially filled with air. Electronically Signed   By: Garald Balding M.D.   On: 12/18/2015 21:10    Oren Binet, MD  [facility] Pager:[phone]  If 7PM-7AM, please contact night-coverage [email or web address] Password [facility] 01/01/2016, 11:34 AM   LOS: 26 days

## 2016-01-02 LAB — CBC WITH DIFFERENTIAL/PLATELET
BASOS ABS: 0 10*3/uL (ref 0.0–0.1)
BASOS PCT: 0 %
Eosinophils Absolute: 0.1 10*3/uL (ref 0.0–0.7)
Eosinophils Relative: 0 %
HEMATOCRIT: 27.8 % — AB (ref 39.0–52.0)
HEMOGLOBIN: 8.8 g/dL — AB (ref 13.0–17.0)
Lymphocytes Relative: 5 %
Lymphs Abs: 1.2 10*3/uL (ref 0.7–4.0)
MCH: 25.4 pg — ABNORMAL LOW (ref 26.0–34.0)
MCHC: 31.7 g/dL (ref 30.0–36.0)
MCV: 80.3 fL (ref 78.0–100.0)
MONOS PCT: 5 %
Monocytes Absolute: 1.3 10*3/uL — ABNORMAL HIGH (ref 0.1–1.0)
NEUTROS ABS: 23 10*3/uL — AB (ref 1.7–7.7)
NEUTROS PCT: 90 %
Platelets: 909 10*3/uL (ref 150–400)
RBC: 3.46 MIL/uL — AB (ref 4.22–5.81)
RDW: 17.7 % — ABNORMAL HIGH (ref 11.5–15.5)
WBC: 25.5 10*3/uL — AB (ref 4.0–10.5)

## 2016-01-02 LAB — GLUCOSE, CAPILLARY
GLUCOSE-CAPILLARY: 310 mg/dL — AB (ref 65–99)
GLUCOSE-CAPILLARY: 273 mg/dL — AB (ref 65–99)
GLUCOSE-CAPILLARY: 106 mg/dL — AB (ref 65–99)
GLUCOSE-CAPILLARY: 307 mg/dL — AB (ref 65–99)
Glucose-Capillary: 110 mg/dL — ABNORMAL HIGH (ref 65–99)
Glucose-Capillary: 407 mg/dL — ABNORMAL HIGH (ref 65–99)

## 2016-01-02 LAB — FERRITIN: Ferritin: 1500 ng/mL — ABNORMAL HIGH (ref 24–336)

## 2016-01-02 LAB — BASIC METABOLIC PANEL
ANION GAP: 9 (ref 5–15)
BUN: 51 mg/dL — ABNORMAL HIGH (ref 6–20)
CALCIUM: 9.5 mg/dL (ref 8.9–10.3)
CO2: 20 mmol/L — AB (ref 22–32)
Chloride: 99 mmol/L — ABNORMAL LOW (ref 101–111)
Creatinine, Ser: 1.05 mg/dL (ref 0.61–1.24)
GFR calc non Af Amer: >60 mL/min (ref >60)
Glucose, Bld: 365 mg/dL — ABNORMAL HIGH (ref 65–99)
Potassium: 5.5 mmol/L — ABNORMAL HIGH (ref 3.5–5.1)
Sodium: 128 mmol/L — ABNORMAL LOW (ref 135–145)

## 2016-01-02 LAB — FOLATE: FOLATE: 11.4 ng/mL (ref >5.9)

## 2016-01-02 LAB — IRON AND TIBC
IRON: 94 ug/dL (ref 45–182)
Saturation Ratios: 27 % (ref 17.9–39.5)
TIBC: 353 ug/dL (ref 250–450)
UIBC: 259 ug/dL

## 2016-01-02 LAB — RETICULOCYTES
RBC.: 3.24 MIL/uL — ABNORMAL LOW (ref 4.22–5.81)
RETIC COUNT ABSOLUTE: 64.8 10*3/uL (ref 19.0–186.0)
Retic Ct Pct: 2 % (ref 0.4–3.1)

## 2016-01-02 LAB — VITAMIN B12: VITAMIN B 12: 1102 pg/mL — AB (ref 180–914)

## 2016-01-02 MED ORDER — FREE WATER
200.0000 mL | Freq: Three times a day (TID) | Status: DC
  Administered 2016-01-02 (×2): 200 mL

## 2016-01-02 MED ORDER — INSULIN GLARGINE 100 UNIT/ML ~~LOC~~ SOLN
20.0000 [IU] | Freq: Every day | SUBCUTANEOUS | Status: DC
  Administered 2016-01-02 – 2016-01-05 (×4): 20 [IU] via SUBCUTANEOUS
  Filled 2016-01-02 (×6): qty 0.2

## 2016-01-02 MED ORDER — IPRATROPIUM-ALBUTEROL 0.5-2.5 (3) MG/3ML IN SOLN
3.0000 mL | Freq: Four times a day (QID) | RESPIRATORY_TRACT | Status: DC
  Administered 2016-01-02 – 2016-01-05 (×12): 3 mL via RESPIRATORY_TRACT
  Filled 2016-01-02 (×12): qty 3

## 2016-01-02 MED ORDER — SODIUM POLYSTYRENE SULFONATE 15 GM/60ML PO SUSP
30.0000 g | Freq: Two times a day (BID) | ORAL | Status: AC
  Administered 2016-01-02 (×2): 30 g
  Filled 2016-01-02 (×2): qty 120

## 2016-01-02 NOTE — Care Management Note (Signed)
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  [facility] provide supplies as well as O2 when needed.  Have notified both agencies that tentative plan is for discharge on Wednesday.  Pt is currently on 28% TC with O2 @ 5L/min.  Pt was not on home O2 with TC previously and will need to be tested on RA before discharge to see if it is needed.  If he is discharged home on RA, informed mother than she could use the pulse oximeter that she has @ home to test his O2 sat if he appears to be in distress and that O2 could be ordered at any time that his sats dropped to 88% or lower.                              Expected Discharge Plan:  [place]  Discharge planning Services  CM Consult  Post Acute Care Choice:  Resumption of Svcs/PTA Provider  HH Arranged:  RN [facility] Agency:  [place]  Status of Service:  In process, will continue to follow  Girard Cooter, RN 01/02/2016, 1:39 PM

## 2016-01-02 NOTE — Progress Notes (Signed)
CRITICAL VALUE ALERT  Critical value received:  Platelet 909  Date of notification:  01/02/2016  Time of notification:  0705  Critical value read back:Yes.    Nurse who received alert:  Annice Pih  MD notified (1st page):  Ghimire  Time of first page:  0707  MD notified (2nd page):  Time of second page:  Responding MD:    Time MD responded:

## 2016-01-02 NOTE — Progress Notes (Signed)
PATIENT DETAILS Name: UMAR PATMON Age: 30 y.o. 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 Continued to require ventilator support during this hospital stay-PCCM attempting to wean him off the ventilator-has been on a trach collar for the past 24 hours. Infectious disease directing antibiotic therapy-unfortunately patient has multiple allergies to antibiotics. Slowly improving.  Significant Events: 2/28 admit to [facility] by PCCM 3/2 no fevers, low secretions, vented, refusing Chest PT and other treatments 3/3 improved to TC, pcxr better 3/5 trach collar 24 Hrs 3/6 Tmax 103.2 3/8 still spiking temps  3/9 Colistin started  3/13 encephalopathic, rising creatinine, hyperkalemic 3/14 remains encephalopathic - 1/2 blood cx's with GPCs 3/16 unresponsive - resp pattern worse, agonal - added inhaled amikacin, placed back on vent 3/21-transferred to [facility]  Subjective: Awake and alert this morning. On trach collar-for the past 11 hours-and not requiring any ventilator support   Assessment/Plan: Acute on chronic hypoxic respiratory failure/tracheostomy dependent: Secondary to multidrug resistant Pseudomonas pneumonia and ?MRSA. PCCM following, and attempting to wean him off the ventilator-now able to be off the ventilator for the past 48 hours and is on a trach collar. Will continue to follow and await further recommendations from PCCM  Sepsis secondary to VAP/ MDR Pseudeomonas and MRSA LYY:TKPTW resolved, leukocytosis has started to downtrend. Given multiple allergies, drug  resistant bacteria-limited options. Infectious disease following an directing care. Currently on cefepime (premedication with steroids and Pepcid) through 3/28. Vancomycin discontinued on 3/27. Sepsis pathophysiology has resolved.  Acute renal failure: Likely secondary to prerenal azotemia-setting of sepsis. Resolved with supportive care. Follow periodically  Hyperkalemia: persistent-Kayexalate 2 today, repeat Iytes tomorrow  Acute encephalopathy: Likely toxic met encephalopathy from sepsis. Seems to have resolved, is awake and alert this morning.  Continue IV antibiotics.EEG 03/13-without any seizure-like activity, but moderate diffuse slowing. Follow.  Yeast UTI: Completed a course of fluconazole-per infectious disease   ? Seizure: Thought to be secondary to fever, and potential drug reaction from inhaled amikacin. No further seizures since then.EEG 03/13-without any seizure. Monitor and follow closely.  Chronic hypotension: Continue Midodrine-BP stable for now  Anemia: Likely secondary to acute illness on top of chronic disease. Hemoglobin stable, follow trend.  Thrombocytosis: Likely reflecting chronic inflammation-acute phase reactant. Check Fe panel.Follow.  History of gastroparesis: Likely secondary to diabetes, no vomiting-seem stable at present. Follow  Dysphagia: Kept NPO while on a ventilator-we will ask speech therapy to evaluate and start appropriate diet. Continue PEG tube feedings, will ask nutrition to see if we can resume nocturnal feeds.  Severe protein calorie malnutrition: continue peg tube feeds-see above  Stage IV sacral ulcers/stage III left heel ulcer/Full thickness wound mid back:resent prior to admission, continues to be followed wound care RN. Has a left lower quadrant colostomy in place  Type 1 diabetes: CBGs uncontrolled-but on steroids-increase Lantus to 20 units, continue SSI. We will try to allow some permissive hyperglycemia, as pt has hx of rapidly  developing hypoglycemia when attempts are made to strictly control his CBG   Chronic pain syndrome: Secondary to extensive sacral ulcers-continue transdermal fentanyl/oxycodone. Agree with Dr. Garen Lah assessment of resuming judicious  narcotics and antispasmodic medications to accomplish comfort. Unfortunate 30 year old is deteriorating slowly, and do not wish to see him suffer at this time.  Anxiety: Continue Ativan as needed.  History of neurogenic bladder: Supra pubic catheter in place.  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. IVC filter placed 8/6. Review of prior documentation-not on A/C due to need for frequent debridements which have caused extensive bleeding, previously on apixaban-on prophylactic Heparin currently   History of spinal cord infarct with paraplegia  Palliative care: Unfortunate 30 year old patient with prior spinal cord injury with resultant paraplegia-chronic tracheostomy-multiple infections in the past with multidrug resistant organisms-multiple allergies to numerous antibiotics-brought into the hospital for worsening hypoxic respiratory failure secondary to ventilator associated pneumonia from multidrug resistant Pseudomonas. Unfortunately given severe deconditioning, not be able to be weaned off the ventilator. Hospital course complicated by worsening sepsis and renal failure in spite of broad spectrum antibiotics. Family has had multiple discussions with critical care MDs, palliative care M.D.-currently he remains a full code. However family is aware of very poor overall prognoses.  Disposition: Remain inpatient-home when he completes IV antibiotics-likely in 1-2 days  Antimicrobial agents  See below  Anti-infectives    Start     Dose/Rate Route Frequency Ordered Stop   12/31/15 1300  vancomycin (VANCOCIN) 500 mg in sodium chloride 0.9 % 100 mL IVPB  Status:  Discontinued     500 mg 100 mL/hr over 60 Minutes Intravenous Every 24 hours 12/31/15 0901 01/02/16 1114   12/26/15  1100  vancomycin (VANCOCIN) IVPB 750 mg/150 ml premix  Status:  Discontinued     750 mg 150 mL/hr over 60 Minutes Intravenous Every 48 hours 12/26/15 1014 12/31/15 0901   12/26/15 1100  ceFEPIme (MAXIPIME) 1 g in dextrose 5 % 50 mL IVPB     1 g 100 mL/hr over 30 Minutes Intravenous Every 12 hours 12/26/15 1014     12/25/15 0830  vancomycin (VANCOCIN) 500 mg in sodium chloride 0.9 % 100 mL IVPB     500 mg 100 mL/hr over 60 Minutes Intravenous  Once 12/25/15 0816 12/25/15 1046   12/22/15 1200  amikacin (AMIKIN) injection 500 mg  Status:  Discontinued     500 mg Inhalation 2 times daily 12/22/15 0950 12/27/15 0030   12/21/15 1030  vancomycin (VANCOCIN) 500 mg in sodium chloride 0.9 % 100 mL IVPB  Status:  Discontinued     500 mg 100 mL/hr over 60 Minutes Intravenous Every 24 hours 12/20/15 1156 12/23/15 0954   12/20/15 2300  vancomycin (VANCOCIN) 500 mg in sodium chloride 0.9 % 100 mL IVPB  Status:  Discontinued     500 mg 100 mL/hr over 60 Minutes Intravenous Every 12 hours 12/20/15 1145 12/20/15 1156   12/20/15 0930  vancomycin (VANCOCIN) 1,250 mg in sodium chloride 0.9 % 250 mL IVPB     1,250 mg 166.7 mL/hr over 90 Minutes Intravenous  Once 12/20/15 0858 12/20/15 1211   12/15/15 1300  fluconazole (DIFLUCAN) tablet 100 mg  Status:  Discontinued     100 mg Oral Daily 12/15/15 1211 12/28/15 1129   12/15/15 1230  colistimethate (COLYMYCIN) 137.25 mg in sodium chloride 0.9 % 100 mL IVPB  Status:  Discontinued     5 mg/kg/day  54.9 kg 203.7 mL/hr over 30 Minutes Intravenous Every 12 hours 12/15/15 1211 12/19/15 1147   12/13/15 2200  tigecycline (TYGACIL) 50 mg in sodium chloride 0.9 % 100 mL IVPB  Status:  Discontinued     50 mg 200 mL/hr over 30  Minutes Intravenous Every 12 hours 12/13/15 0856 12/15/15 1211   12/13/15 1000  tigecycline (TYGACIL) 100 mg in sodium chloride 0.9 % 100 mL IVPB     100 mg 200 mL/hr over 30 Minutes Intravenous  Once 12/13/15 0856 12/13/15 1117   12/13/15 1000   aztreonam (AZACTAM) 2 g in dextrose 5 % 50 mL IVPB  Status:  Discontinued     2 g 100 mL/hr over 30 Minutes Intravenous 3 times per day 12/13/15 0856 12/15/15 1211   12/07/15 0600  imipenem-cilastatin (PRIMAXIN) 500 mg in sodium chloride 0.9 % 100 mL IVPB  Status:  Discontinued     500 mg 200 mL/hr over 30 Minutes Intravenous 4 times per day 12/06/15 2311 12/06/15 2329   12/07/15 0000  levofloxacin (LEVAQUIN) IVPB 750 mg  Status:  Discontinued     750 mg 100 mL/hr over 90 Minutes Intravenous Every 24 hours 12/06/15 2339 12/13/15 0856   12/06/15 2315  vancomycin (VANCOCIN) 500 mg in sodium chloride 0.9 % 100 mL IVPB  Status:  Discontinued     500 mg 100 mL/hr over 60 Minutes Intravenous Every 12 hours 12/06/15 2311 12/13/15 0856   12/06/15 2230  imipenem-cilastatin (PRIMAXIN) 500 mg in sodium chloride 0.9 % 100 mL IVPB     500 mg 200 mL/hr over 30 Minutes Intravenous  Once 12/06/15 2209 12/06/15 2335      DVT Prophylaxis: Prophylactic  Heparin   Code Status: Full code   Family Communication Mother at bedside  Procedures: Chronic trach / PEG >> Suprapubic catheter changed 3/8  CONSULTS:  pulmonary/intensive care, ID and Palliative care  Time spent 20 minutes-Greater than 50% of this time was spent in counseling, explanation of diagnosis, planning of further management, and coordination of care.  MEDICATIONS: Scheduled Meds: . acetaminophen (TYLENOL) oral liquid 160 mg/5 mL  500 mg Per Tube TID  . antiseptic oral rinse  7 mL Mouth Rinse QID  . baclofen  5 mg Per Tube QID  . buPROPion  150 mg Oral Daily  . ceFEPime (MAXIPIME) IV  1 g Intravenous Q12H  . chlorhexidine gluconate (SAGE KIT)  15 mL Mouth Rinse BID  . famotidine (PEPCID) IV  20 mg Intravenous Q12H  . fentaNYL  50 mcg Transdermal Q72H  . free water  200 mL Per Tube 3 times per day  . heparin  5,000 Units Subcutaneous 3 times per day  . insulin aspart  0-20 Units Subcutaneous 6 times per day  . insulin glargine   20 Units Subcutaneous Daily  . LORazepam  0.5 mg Per Tube TID  . methylPREDNISolone (SOLU-MEDROL) injection  20 mg Intravenous Q12H  . midodrine  5 mg Per Tube TID AC  . mupirocin cream   Topical Daily  . naLOX[facility])  injection  0.4 mg Intramuscular Once  . sodium polystyrene  30 g Per Tube BID   Continuous Infusions: . sodium chloride 10 mL/hr (12/30/15 0919)  . feeding supplement (GLUCERNA 1.2 CAL) 1,000 mL (01/01/16 0244)   PRN Meds:.acetaminophen, albuterol, dextrose, diphenhydrAMINE, EPINEPHrine, hydrALAZINE, LORazepam, oxyCODONE, sodium chloride flush    PHYSICAL EXAM: Vital signs in last 24 hours: Filed Vitals:   01/02/16 0500 01/02/16 0738 01/02/16 0739 01/02/16 0753  BP:    104/73  Pulse:   81 84  Temp:    98 F (36.7 C)  TempSrc:    Oral  Resp:   18 23  Height:      Weight: 52.5 kg (115 lb 11.9 oz)  SpO2:  99% 99% 99%    Weight change: -1.614 kg (-3 lb 8.9 oz) Filed Weights   12/31/15 0452 01/01/16 0441 01/02/16 0500  Weight: 54.069 kg (119 lb 3.2 oz) 54.114 kg (119 lb 4.8 oz) 52.5 kg (115 lb 11.9 oz)   Body mass index is 17.6 kg/(m^2).   Gen Exam: Awake and alert this morning. On trach collar Neck: Supple   Chest: Clear to auscultation anteriorly CVS: S1 S2 Regular  Abdomen: soft, BS +, non tender, PEG tube in place, suprapubic catheter in place. Left lower quadrant colostomy in place Extremities: no edema, lower extremities warm to touch.   Intake/Output from previous day:  Intake/Output Summary (Last 24 hours) at 01/02/16 1121 Last data filed at 01/02/16 0600  Gross per 24 hour  Intake   1925 ml  Output   3200 ml  Net  -1275 ml     LAB RESULTS: CBC  Recent Labs Lab 12/29/15 0945 12/30/15 0445 12/31/15 0400 01/01/16 0457 01/02/16 0500  WBC 38.7* 33.2* 34.1* 31.7* 25.5*  HGB 8.2* 8.5* 8.6* 8.4* 8.8*  HCT 27.2* 28.9* 27.7* 28.3* 27.8*  PLT 655* 725* 729* 843* 909*  MCV 82.2 81.9 81.7 81.3 80.3  MCH 24.8* 24.1* 25.4* 24.1*  25.4*  MCHC 30.1 29.4* 31.0 29.7* 31.7  RDW 17.0* 17.3* 17.3* 17.3* 17.7*  LYMPHSABS  --  1.7  --  1.9 1.2  MONOABS  --  1.3*  --  0.6 1.3*  EOSABS  --  0.0  --  0.0 0.1  BASOSABS  --  0.0  --  0.0 0.0    Chemistries   Recent Labs Lab 12/27/15 0528 12/29/15 0945 12/30/15 0445 12/31/15 0400 01/01/16 0457 01/02/16 0500  NA 136 138 132* 136 130* 128*  K 4.0 4.3 5.4* 5.4* 5.8* 5.5*  CL 105 107 103 107 101 99*  CO2 21* 21* 19* 19* 21* 20*  GLUCOSE 331* 241* 453* 343* 445* 365*  BUN 54* 69* 67* 62* 58* 51*  CREATININE 2.11* 1.72* 1.61* 1.39* 1.22 1.05  CALCIUM 10.4* 10.2 9.8 9.8 9.6 9.5  MG 1.7  --   --   --   --   --     CBG:  Recent Labs Lab 01/01/16 1615 01/01/16 2010 01/02/16 0021 01/02/16 0445 01/02/16 0751  GLUCAP 403* 271* 110* 310* 273*    GFR Estimated Creatinine Clearance: 75.7 mL/min (by C-G formula based on Cr of 1.05).  Coagulation profile No results for input(s): INR, PROTIME in the last 168 hours.  Cardiac Enzymes No results for input(s): CKMB, TROPONINI, MYOGLOBIN in the last 168 hours.  Invalid input(s): CK  Invalid input(s): POCBNP No results for input(s): DDIMER in the last 72 hours. No results for input(s): HGBA1C in the last 72 hours. No results for input(s): CHOL, HDL, LDLCALC, TRIG, CHOLHDL, LDLDIRECT in the last 72 hours. No results for input(s): TSH, T4TOTAL, T3FREE, THYROIDAB in the last 72 hours.  Invalid input(s): FREET3 No results for input(s): VITAMINB12, FOLATE, FERRITIN, TIBC, IRON, RETICCTPCT in the last 72 hours. No results for input(s): LIPASE, AMYLASE in the last 72 hours.  Urine Studies No results for input(s): UHGB, CRYS in the last 72 hours.  Invalid input(s): UACOL, UAPR, USPG, UPH, UTP, UGL, UKET, UBIL, UNIT, UROB, ULEU, UEPI, UWBC, URBC, UBAC, CAST, UCOM, BILUA  MICROBIOLOGY: Recent Results (from the past 240 hour(s))  Culture, respiratory (NON-Expectorated)     Status: None   Collection Time: 12/24/15  1:45 PM   Result Value Ref  Range Status   Specimen Description TRACHEAL ASPIRATE  Final   Special Requests Normal  Final   Gram Stain   Final    ABUNDANT WBC PRESENT, PREDOMINANTLY PMN FEW SQUAMOUS EPITHELIAL CELLS PRESENT NO ORGANISMS SEEN THIS SPECIMEN IS ACCEPTABLE FOR SPUTUM CULTURE Performed at [facility]    Culture   Final    RARE METHICILLIN RESISTANT STAPHYLOCOCCUS AUREUS 22 Note: RIFAMPIN AND GENTAMICIN SHOULD NOT BE USED AS SINGLE DRUGS FOR TREATMENT OF STAPH INFECTIONS. CRITICAL RESULT CALLED TO, READ BACK BY AND VERIFIED WITH: TROYCE H. 3 17 @ 0821 BY PARDA Performed at [facility]    Report Status 12/28/2015 FINAL  Final   Organism ID, Bacteria METHICILLIN RESISTANT STAPHYLOCOCCUS AUREUS  Final      Susceptibility   Methicillin resistant staphylococcus aureus - MIC*    CLINDAMYCIN >=8 RESISTANT Resistant     ERYTHROMYCIN >=8 RESISTANT Resistant     GENTAMICIN <=0.5 SENSITIVE Sensitive     LEVOFLOXACIN >=8 RESISTANT Resistant     OXACILLIN >=4 RESISTANT Resistant     RIFAMPIN <=0.5 SENSITIVE Sensitive     TRIMETH/SULFA >=320 RESISTANT Resistant     VANCOMYCIN 1 SENSITIVE Sensitive     TETRACYCLINE >=16 RESISTANT Resistant     * RARE METHICILLIN RESISTANT STAPHYLOCOCCUS AUREUS  Culture, blood (routine x 2)     Status: None   Collection Time: 12/25/15  6:46 PM  Result Value Ref Range Status   Specimen Description BLOOD LEFT ARM  Final   Special Requests IN PEDIATRIC BOTTLE 3CC  Final   Culture NO GROWTH 5 DAYS  Final   Report Status 12/30/2015 FINAL  Final  Culture, blood (routine x 2)     Status: None   Collection Time: 12/25/15  6:52 PM  Result Value Ref Range Status   Specimen Description BLOOD LEFT ARM  Final   Special Requests IN PEDIATRIC BOTTLE 3CC  Final   Culture NO GROWTH 5 DAYS  Final   Report Status 12/30/2015 FINAL  Final    RADIOLOGY STUDIES/RESULTS: Ct Abdomen Pelvis Wo Contrast  12/10/2015  CLINICAL DATA:  Paralyzed from accident.  Rule out fistula from bladder to ostomy. Severe case of MRSA. Decubitus ulcers to back and buttocks. Oral contrast only due to inadequate IV access. EXAM: CT ABDOMEN AND PELVIS WITHOUT CONTRAST TECHNIQUE: Multidetector CT imaging of the abdomen and pelvis was performed following the standard protocol without IV contrast. COMPARISON:  None. FINDINGS: Lower chest: There are bibasilar consolidations which could represent atelectasis or pneumonia. There are also small bilateral pleural effusions. Hepatobiliary: No mass visualized on this un-enhanced exam. Pancreas: No mass or inflammatory process identified on this un-enhanced exam. Spleen: Within normal limits in size. Adrenals/Urinary Tract: Bladder walls appear thickened. Suprapubic catheter in place without obvious complicating feature. Kidneys are unremarkable without stone or hydronephrosis. Stomach/Bowel: Gastrostomy tube in place. Soft tissue defect of the left lower abdominal wall, presumed colostomy. No large or small bowel dilatation. Vascular/Lymphatic: No pathologically enlarged lymph nodes. No evidence of abdominal aortic aneurysm. IVC filter in place, well-positioned just below the level of the renal veins. Reproductive: No mass or other significant abnormality. Other: Small amount of free fluid within the right paracolic gutter and left lower quadrant. No abscess collection identified within the abdomen or pelvis. No free intraperitoneal air. No obvious fistula between the bladder and left lower abdominal ostomy site. Musculoskeletal: Ill-defined edema throughout the subcutaneous soft tissues suggesting anasarca. Soft tissue defects within the bilateral buttocks region, compatible with the  given history of decubitus ulcers. Additional large soft tissue defect overlying the sacrum, again compatible with the given history of decubitus ulcer. Underlying the decubitus ulcer sites, there are fairly well-defined cortical defects within the posterior right  iliac bone and posterior sacrum. IMPRESSION: 1. Circumferential thickening of the walls of the bladder suspicious for cystitis. Recommend correlation with urinalysis. Suprapubic catheter in place. 2. Left lower abdominal wall defect, presumed colostomy. No fistulous connection identified between the bladder and this presumed colostomy. If clinical concern for fistula persists, could consider CT cystogram by injecting contrast into the bladder through the indwelling suprapubic catheter. 3. Small amount of ascites. No intra-abdominal or intrapelvic abscess collection seen. No free intraperitoneal air. 4. Decubitus ulcers of the bilateral buttocks regions and overlying the sacrum. 5. Cortical defects along the posterior margins of the right iliac bone and posterior sacrum, somewhat well-defined, immediately underlying the sites of the decubitus ulcers. I suspect that these are chronic osseous erosions related to the overlying decubitus ulcers. Alternatively, these could be postsurgical changes performed in conjunction with decubitus ulcer debridements if surgical history correlates. Destructive changes of osteomyelitis is certainly an additional possibility. MRI may be helpful for further characterization. 6. Bibasilar consolidations, atelectasis versus pneumonia, with small adjacent pleural effusions. These results will be called to the ordering clinician or representative by the Radiologist Assistant, and communication documented in the PACS or zVision Dashboard. Electronically Signed   By: Franki Cabot M.D.   On: 12/10/2015 18:50   Dg Chest Port 1 View  12/29/2015  CLINICAL DATA:  Tracheostomy patient, healthcare associated pneumonia old with acute and chronic respiratory failure EXAM: PORTABLE CHEST 1 VIEW COMPARISON:  Portable chest x-ray of December 23, 2015 FINDINGS: The lungs are well-expanded. There has been marked improvement in the appearance of the infiltrate in the right lung. Mild interstitial prominence  persists in the mid and lower lung. There is no pleural effusion. The left lung is clear. The heart and pulmonary vascularity are normal. The tracheostomy appliance tip lies at the inferior margin of the clavicular heads which is 5.8 cm above the carina. The right PICC line catheter tip projects over the distal SVC. IMPRESSION: Ongoing improvement of right-sided pneumonia. There is no significant pleural effusion nor evidence of CHF. Electronically Signed   By: David  Martinique M.D.   On: 12/29/2015 07:27   Portable Chest Xray  12/23/2015  CLINICAL DATA:  Pneumonia EXAM: PORTABLE CHEST 1 VIEW COMPARISON:  12/22/2015 FINDINGS: Right PICC and tracheostomy tube are stable. Bilateral airspace disease is stable. There is partial collapse of the right lower lobe which is stable. No pneumothorax. IMPRESSION: Stable bilateral airspace disease. Stable partial collapse of the right lower lobe. Electronically Signed   By: Marybelle Killings M.D.   On: 12/23/2015 08:14   Dg Chest Port 1 View  12/22/2015  CLINICAL DATA:  Dyspnea.  Quadriplegia. EXAM: PORTABLE CHEST 1 VIEW COMPARISON:  12/20/2015 FINDINGS: Support apparatus stable. Increased right perihilar and basilar airspace opacity. The patient is rotated to the right on today's radiograph, reducing diagnostic sensitivity and specificity. Deformity of the right distal clavicle, stable. Improved aeration at the left lung base. IMPRESSION: 1. Significant worsening of airspace opacity in the right mid lung and right lung base, nonspecific but potentially from pneumonia. However, there is some improvement in the airspace opacity at the left lung base. Electronically Signed   By: Van Clines M.D.   On: 12/22/2015 09:38   Dg Chest Port 1 View  12/20/2015  CLINICAL DATA:  Respiratory failure EXAM: PORTABLE CHEST 1 VIEW COMPARISON:  December 15, 2015 FINDINGS: Tracheostomy catheter tip is 4.8 cm above the carina. Central catheter tip is in the superior vena cava near the  cavoatrial junction. There is no appreciable pneumothorax. There is airspace consolidation in the medial right base with small right effusion. Left lung is clear. Heart is upper normal in size with pulmonary vascularity within normal limits. No adenopathy. There is lumbar levoscoliosis. IMPRESSION: Tube and catheter positions as described without pneumothorax. Consolidation medial right base with small right effusion. Left lung clear. No change in cardiac silhouette. Electronically Signed   By: Lowella Grip III M.D.   On: 12/20/2015 07:37   Dg Chest Port 1 View  12/15/2015  CLINICAL DATA:  Followup exam.  Pneumonia. EXAM: PORTABLE CHEST 1 VIEW COMPARISON:  12/13/2015 FINDINGS: Persistent lung base opacity noted consistent with combination of pleural fluid and atelectasis. Pneumonia not excluded. No evidence of pulmonary edema. No pneumothorax. Heart, mediastinum hila are unremarkable. Tracheostomy tube is stable in well positioned. IMPRESSION: 1. No change from the previous exam. 2. Persistent lower lung zone opacity consistent with a combination pleural fluid and probable atelectasis. Pneumonia is possible. Electronically Signed   By: Lajean Manes M.D.   On: 12/15/2015 10:10   Dg Chest Port 1 View  12/13/2015  CLINICAL DATA:  Pneumonia. EXAM: PORTABLE CHEST 1 VIEW COMPARISON:  December 09, 2015. FINDINGS: Stable cardiomediastinal silhouette. No pneumothorax is noted. Mildly increased bilateral pleural effusions are noted with probable associated atelectasis. Tracheostomy tube is unchanged in position. Bony thorax is unremarkable. IMPRESSION: Mildly increased bibasilar opacities are noted most consistent with subsegmental atelectasis and associated pleural effusions. Electronically Signed   By: Marijo Conception, M.D.   On: 12/13/2015 10:16   Dg Chest Port 1 View  12/09/2015  CLINICAL DATA:  Pneumonia. EXAM: PORTABLE CHEST 1 VIEW COMPARISON:  12/08/2015. FINDINGS: Tracheostomy to in stable position.  Mediastinum hilar structures normal. Low lung volumes with persistent bibasilar atelectasis and/or infiltrates again noted. Small pleural effusions again noted. No pneumothorax. IMPRESSION: 1. Tracheostomy tube in stable position. 2. Low lung volumes with persistent bibasilar atelectasis and/or infiltrates. Persistent bilateral pleural effusions. Similar findings noted on prior exam. Electronically Signed   By: [place]   On: 12/09/2015 07:20   Dg Chest Port 1 View  12/08/2015  CLINICAL DATA:  Acute respiratory failure. EXAM: PORTABLE CHEST 1 VIEW COMPARISON:  12/07/2015. FINDINGS: Tracheostomy tube in stable position. Heart size stable. Persistent bibasilar atelectasis and/or infiltrates. Small left pleural effusion. No pneumothorax. Right costophrenic angle not imaged. IMPRESSION: 1. Tracheostomy tube in stable position. 2. Persistent bibasilar atelectasis and/or infiltrates without interim clearing. Small left pleural effusion again noted. Electronically Signed   By: Marcello Moores  Register   On: 12/08/2015 07:09   Dg Chest Port 1 View  12/07/2015  CLINICAL DATA:  Acute onset of respiratory failure. Initial encounter. EXAM: PORTABLE CHEST 1 VIEW COMPARISON:  Chest radiograph performed 12/06/2015 FINDINGS: The patient's tracheostomy tube is seen ending 5 cm above the carina. Bibasilar airspace opacities may reflect pneumonia or pulmonary edema. Small bilateral pleural effusions are noted. No pneumothorax is seen. The cardiomediastinal silhouette remains normal in size. No acute osseous abnormalities are identified. IMPRESSION: Bibasilar airspace opacities may reflect pneumonia or pulmonary edema. Small bilateral pleural effusions noted. Electronically Signed   By: Garald Balding M.D.   On: 12/07/2015 06:43   Dg Chest Portable 1 View  12/06/2015  CLINICAL DATA:  30 year old male with sepsis and decreased  O2 sats. EXAM: PORTABLE CHEST 1 VIEW COMPARISON:  Radiograph dated 11/02/2015 FINDINGS: Tracheostomy  remains in stable positioning above the carina. There are bibasilar opacities with silhouetting of the diaphragm, concerning for pneumonia. There has been interval increase in the left lung base density compared to the prior study. Small bilateral pleural effusions may be present. The cardiac silhouette is within normal limits. No acute osseous pathology. IMPRESSION: Bibasilar airspace densities concerning for pneumonia. Clinical correlation and follow-up recommended. Electronically Signed   By: Anner Crete M.D.   On: 12/06/2015 19:59   Dg Abd Portable 1v  12/18/2015  CLINICAL DATA:  Acute onset of abdominal distention. Initial encounter. EXAM: PORTABLE ABDOMEN - 1 VIEW COMPARISON:  CT of the abdomen and pelvis from 12/10/2015 FINDINGS: The visualized bowel gas pattern is unremarkable. Scattered air and stool filled loops of colon are seen; no abnormal dilatation of small bowel loops is seen to suggest small bowel obstruction. No free intra-abdominal air is identified, though evaluation for free air is limited on a single supine view. The stomach is partially filled with air. An IVC filter is noted. The visualized osseous structures are within normal limits; the sacroiliac joints are unremarkable in appearance. The visualized lung bases are essentially clear. IMPRESSION: Unremarkable bowel gas pattern; no free intra-abdominal air seen. Moderate amount of stool noted in the colon. Stomach partially filled with air. Electronically Signed   By: Garald Balding M.D.   On: 12/18/2015 21:10    Oren Binet, MD  [facility] Pager:[phone]  If 7PM-7AM, please contact night-coverage [email or web address] Password [email or web address] 01/02/2016, 11:21 AM   LOS: 27 days

## 2016-01-02 NOTE — Progress Notes (Signed)
PULMONARY / CRITICAL CARE MEDICINE   Name: Jason [name] MRN: [id number] DOB: June 03, 1986    ADMISSION DATE:  12/06/2015 CONSULTATION DATE:  12/06/15  REFERRING MD:  EDP  CHIEF COMPLAINT:  Decrease in O2 sats, fever, PNA  SUBJECTIVE: On trache collar over the weekend.   VITAL SIGNS: BP 115/83 mmHg  Pulse 80  Temp(Src) 98 F (36.7 C) (Oral)  Resp 18  Ht 5\' 8"  (1.727 m)  Wt 115 lb 11.9 oz (52.5 kg)  BMI 17.60 kg/m2  SpO2 100%  VENTILATOR SETTINGS: Vent Mode:  [-]  FiO2 (%):  [28 %] 28 %  INTAKE / OUTPUT: I/O last 3 completed shifts: In: [id number] [I.V.:350; NG/GT:3075; IV Piggyback:300] Out: 82 [Urine:4100; Stool:500]  PHYSICAL EXAMINATION: General: Chronically ill --> interacting more this AM. Awake, follows commands Neuro: Opens eyes , gestures to his wife and communicates with her HEENT: PERRL, Clean trach site. Cardiovascular: RRR, no MRG Lungs: diffuse coarse BS.  Abdomen: PEG in place, + BS, ostomy Musculoskeletal: Muscle wasting bilaterally. Skin: No rashes. Sacral wound vac in place.   LABS:  BMET  Recent Labs Lab 12/31/15 0400 01/01/16 0457 01/02/16 0500  NA 136 130* 128*  K 5.4* 5.8* 5.5*  CL 107 101 99*  CO2 19* 21* 20*  BUN 62* 58* 51*  CREATININE 1.39* 1.22 1.05  GLUCOSE 343* 445* 365*   Electrolytes  Recent Labs Lab 12/27/15 0528  12/31/15 0400 01/01/16 0457 01/02/16 0500  CALCIUM 10.4*  < > 9.8 9.6 9.5  MG 1.7  --   --   --   --   PHOS 3.1  --   --   --   --   < > = values in this interval not displayed. CBC  Recent Labs Lab 12/31/15 0400 01/01/16 0457 01/02/16 0500  WBC 34.1* 31.7* 25.5*  HGB 8.6* 8.4* 8.8*  HCT 27.7* 28.3* 27.8*  PLT 729* 843* 909*   Coag's No results for input(s): APTT, INR in the last 168 hours.  Sepsis Markers No results for input(s): LATICACIDVEN, PROCALCITON, O2SATVEN in the last 168 hours.  ABG No results for input(s): PHART, PCO2ART, PO2ART in the last 168 hours.  Liver  Enzymes  Recent Labs Lab 12/29/15 0945 12/30/15 0445  AST 21 18  ALT 18 18  ALKPHOS 85 98  BILITOT 0.4 0.5  ALBUMIN 2.4* 2.4*   Cardiac Enzymes No results for input(s): TROPONINI, PROBNP in the last 168 hours.  Glucose  Recent Labs Lab 01/01/16 1152 01/01/16 1615 01/01/16 2010 01/02/16 0021 01/02/16 0445 01/02/16 0751  GLUCAP 188* 403* 271* 110* 310* 273*   Imaging No results found.  STUDIES:  CXR 02/28 > bibasilar airspace densities concerning for PNA. CT abdo/ pelvis>>>no fistula CXR 3/7 >>mildly increased bibasilar opacities are noted most consistent with subsegmental atelectasis and associated pleural effusions.  EEG 03/13 > mild to mod diffuse slowing with triphasic waves (could be seen with hypoxic/ischemic injury, toxic/metabolic encephalopathies (hepatic?), med effect). CXR 3/14 >> Consolidation medial right base with small right effusion, left lung clear  CULTURES: Blood 02/28 >>No growth  Urine 02/28 >>Multiple species, suggest recollection Blood 3/1>> No growth  Sputum 3/6 >>> few pseudomonas >>>MDR Blood Culture 3/6 >> neg Urine Culture 3/7 >>Yeast  MRSA pos screen Blood 03/12 > GPC's > coag neg staph CDiff 3/13 >> Negative  Blood 3/14 >>  ANTIBIOTICS: Vanc 02/28 (7/7) >> 3/7 Levaquin 02/28 (7/7) >>3/7 Tigecycline 3/7 >> 3/9 Aztreonam 3/7 >> 3/9 Colistin 3/9 >> 3/13 Fluconazole 03/9 >  Vanc 03/14 > Inhaled amikacin 3/16>>> Cefepime 3/19>>>  SIGNIFICANT EVENTS: 2/28  Admitted with probable HCAP 3/2- no fevers, low secretions, remains vented, refusing Chest PT and other treatments 3/3 improved to TC, pcxr better 3/5- trach collar 24  Hrs 3/6- Tmax 103.2 3/8 still spiking temps  3/9 Colistin started.  3/13: encephalopathic, rising creatinine, hyperkalemic.  3/14 > remains encephalopathic.  1/2 blood cx's with GPC's.  3/16 : unresponsive. Resp pattern worse, agonal. Adding inhaled amikacin, placed back on vent. Began talks w/ mother and  father about EOL. They understand he may not survive. Mom not ready to make DNR, but says "she is prepared and won't let trent suffer".  3/27 on trache collar x 48 hrs.   LINES/TUBES: Chronic trach / PEG >> Suprapubic catheter changed 3/8  I reviewed CXR myself, trach in good position, infiltrate noted, improving on the right.  ASSESSMENT / PLAN:  PULMONARY A: Chronic respiratory failure with trach dependence - Now acute on Chronic respiratory failure on 3/16 w/ agonal resp pattern.  Tracheostomy status MDR pseudeomonas PNA -->concerned that his clinical decline reflects the result of [name] stopping treatment for the pseudomonas. We had hoped that this was a colonizer, but his rising WBC, resp distress & worsening CXR and overall clinical decline would reflect otherwise.  P:  Cont trache collar as tolerated. Vent prn. Has a vent at home. Pt is on cefepime.  duoneb qid.   CARDIOVASCULAR A:  Chronic hypotension - on midodrine P:  Continue midodrine at current dose.  Monitor hemodynamics.  INFECTIOUS A:   Bacteremia - 1/2 blood cultures from 03/12 with GPC's.  Also with increasing leukocytosis as of 03/14 UTI - hx pseudomonal UTI in past with resistance to multiple abx - positive for yeast  HCAP-->pseudomonas MDR given clinical decline feel that this is more than a colonizer.  Hx sacral wounds P:   ID following - appreciate the assistance. Abx per ID.on cefepime.  WOC following.  Plan d/x mother. Mother asking about need for o2 on d/c. Will assess then.   Monica Becton, MD 01/02/2016, 12:35 PM [facility] Pager [phone] After 3 pm or if no answer, call [phone]

## 2016-01-02 NOTE — Evaluation (Signed)
Clinical/Bedside Swallow Evaluation Patient Details  Name: Jason [name] MRN: [id number] Date of Birth: 04/03/1986  Today's Date: 01/02/2016 Time: SLP Start Time (ACUTE ONLY): 0900 SLP Stop Time (ACUTE ONLY): 0908 SLP Time Calculation (min) (ACUTE ONLY): 8 min  Past Medical History:  Past Medical History  Diagnosis Date  . GERD (gastroesophageal reflux disease)   . Asthma   . Hx MRSA infection     on face  . Gastroparesis   . Diabetic neuropathy ([place])   . Seizures ([place])   . Family history of anesthesia complication     Pt mother can't have epidural procedures  . Dysrhythmia   . Pneumonia   . Arthritis   . Fibromyalgia   . 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 Type I diabetes mellitus ([place])     sees Dr. Loanne Drilling   . Syncope 02/16/2015  . Multiple allergies 11/02/2015  . Recurrent UTI 11/02/2015   Past Surgical History:  Past Surgical History  Procedure Laterality Date  . Tonsillectomy    . Multiple extractions with alveoloplasty N/A 08/03/2014    Procedure: MULTIPLE EXTRACTIONS;  Surgeon: Gae Bon, DDS;  Location: [place];  Service: Oral Surgery;  Laterality: N/A;  . Tee without cardioversion N/A 08/17/2014    Procedure: TRANSESOPHAGEAL ECHOCARDIOGRAM (TEE);  Surgeon: Dorothy Spark, MD;  Location: [facility] ENDOSCOPY;  Service: Cardiovascular;  Laterality: N/A;  . Debridment of decubitus ulcer N/A 10/04/2014    Procedure: DEBRIDMENT OF DECUBITUS ULCER;  Surgeon: Georganna Skeans, MD;  Location: [place];  Service: General;  Laterality: N/A;  . Laparoscopic diverted colostomy N/A 10/12/2014    Procedure: LAPAROSCOPIC DIVERTING COLOSTOMY;  Surgeon: Donnie Mesa, MD;  Location: [place];  Service: General;  Laterality: N/A;  . Insertion of suprapubic catheter N/A 10/12/2014    Procedure: INSERTION OF SUPRAPUBIC CATHETER;  Surgeon: Reece Packer, MD;  Location: [place];  Service: Urology;  Laterality: N/A;  . Incision and drainage of wound N/A 11/12/2014     Procedure: IRRIGATION AND DEBRIDEMENT OF WOUNDS WITH BONE BIOPSY AND SURGICAL PREP ;  Surgeon: Theodoro Kos, DO;  Location: [place];  Service: Plastics;  Laterality: N/A;  . Application of a-cell of back N/A 11/12/2014    Procedure: APPLICATION A CELL AND VAC ;  Surgeon: Theodoro Kos, DO;  Location: [place];  Service: Plastics;  Laterality: N/A;  . Incision and drainage of wound N/A 11/18/2014    Procedure: IRRIGATION AND DEBRIDEMENT OF SACRAL ULCER ONLY WITH PLACEMENT OF A CELL AND VAC/ DRESSING CHANGE TO UPPER BACK AREA.;  Surgeon: Theodoro Kos, DO;  Location: [place];  Service: Plastics;  Laterality: N/A;  . Incision and drainage of wound N/A 11/25/2014    Procedure: IRRIGATION AND DEBRIDEMENT OF SACRAL ULCER AND BACK BURN WITH PLACEMENT OF A-CELL;  Surgeon: Theodoro Kos, DO;  Location: [place];  Service: Plastics;  Laterality: N/A;  . Minor application of wound vac N/A 11/25/2014    Procedure:  WOUND VAC CHANGE;  Surgeon: Theodoro Kos, DO;  Location: [place];  Service: Plastics;  Laterality: N/A;  . Incision and drainage of wound Right 12/08/2014    Procedure: IRRIGATION AND DEBRIDEMENT SACRAL WOUND AND RIGHT ISCHIAL WOUND ;  Surgeon: Theodoro Kos, DO;  Location: [place];  Service: Plastics;  Laterality: Right;  . Application of a-cell of back N/A 12/08/2014    Procedure: APPLICATION OF A-CELL AND WOUND VAC ;  Surgeon: Theodoro Kos, DO;  Location: [place];  Service:  Plastics;  Laterality: N/A;  . Tee without cardioversion N/A 05/05/2015    Procedure: TRANSESOPHAGEAL ECHOCARDIOGRAM (TEE);  Surgeon: Lelon Perla, MD;  Location: [place];  Service: Cardiovascular;  Laterality: N/A;  . Incision and drainage of wound Bilateral 05/05/2015    Procedure: IRRIGATION AND DEBRIDEMENT SACRAL ULCER;  Surgeon: Theodoro Kos, DO;  Location: [place];  Service: Plastics;  Laterality: Bilateral;  . Hematoma evacuation N/A 05/05/2015    Procedure: EVACUATION HEMATOMA bedside procedure;  Surgeon: Theodoro Kos, DO;  Location:  [place];  Service: Plastics;  Laterality: N/A;  . Incision and drainage of wound N/A 08/04/2015    Procedure: IRRIGATION AND DEBRIDEMENT OF SACRAL ULCER AND ;  Surgeon: Loel Lofty Dillingham, DO;  Location: [place];  Service: Plastics;  Laterality: N/A;  . Application of a-cell of chest/abdomen N/A 08/04/2015    Procedure: APPLICATION OF A-CELL ;  Surgeon: Loel Lofty Dillingham, DO;  Location: [place];  Service: Plastics;  Laterality: N/A;   HPI:  30 y.o. 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 Since hospital admission, pt has declined and required vent support. Swallow dx ordered yesterday due to improved respiratory status (>24 hours on trach collar). CXR 3/23 ongoing improvement of R side pneumonia. No significant pleural effusion or evidence of CHF. SLP familiar with pt re: swallow and PMSV. Pt currently wears PMSV during all waking hours.   Assessment / Plan / Recommendation Clinical Impression  PMSV in place upon SLP arrival. Pt familiar to ST service and placed on regular diet, thin liquids following MBS 07/2015 and subsequent bedside swallow evaluations during recent admissions. No evidence of dysphagia and no overt s/s of penetration/aspiration observed during bedside swallow evaluation. Mastication WFL. Pt and parents educated re: diet recommendation of regular textures and thin liquids (straws allowed), meds whole in puree. SLP will f/u x1 to determine diet tolerance.    Aspiration Risk  Mild aspiration risk    Diet Recommendation Regular;Thin liquid   Liquid Administration via: Cup;Straw Medication Administration: Whole meds with puree Supervision: Patient able to self feed;Full supervision/cueing for compensatory strategies;Staff to assist with self feeding Compensations: Minimize environmental distractions;Slow rate;Small sips/bites Postural Changes: Seated upright at 90 degrees     Other  Recommendations Oral Care Recommendations: Oral care BID   Follow up Recommendations  24 hour supervision/assistance    Frequency and Duration min 1 x/week  1 week       Prognosis Prognosis for Safe Diet Advancement: Good      Swallow Study   General HPI: 30 y.o. 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 ED due to drop in O2 saturations. Since hospital admission, pt has declined and required vent support. Swallow dx ordered yesterday due to improved respiratory status (>24 hours on trach collar). CXR 3/23 ongoing improvement of R side pneumonia. No significant pleural effusion or evidence of CHF. SLP familiar with pt re: swallow and PMSV. Pt currently wears PMSV during all waking hours. Type of Study: Bedside Swallow Evaluation Previous Swallow Assessment: see HPI Diet Prior to this Study: NPO Temperature Spikes Noted: No Respiratory Status: Trach Collar;Trach Trach Size and Type: Cuff;#6;Deflated;With PMSV in place History of Recent Intubation: No Behavior/Cognition: Alert;Cooperative;Pleasant mood Oral Cavity Assessment: Within Functional Limits Oral Care Completed by SLP: Yes Oral Cavity - Dentition: Adequate natural dentition;Missing dentition Vision: Functional for  self-feeding Self-Feeding Abilities: Able to feed self;Needs assist;Needs set up Patient Positioning: Upright in bed Baseline Vocal Quality: Normal Volitional Cough: Weak Volitional Swallow: Able to elicit    Oral/Motor/Sensory Function Overall Oral Motor/Sensory Function: Generalized oral weakness   Ice Chips Ice chips: Not tested   Thin Liquid Thin Liquid: Within functional limits Presentation: Cup;Self Fed;Straw    Nectar Thick Nectar Thick Liquid: Not tested   Honey Thick Honey Thick Liquid: Not tested   Puree Puree: Within functional limits Presentation: Spoon   Solid   GO   Solid: Within functional  limits Presentation: Self Fed        Othmar Ringer 01/02/2016,11:56 AM  Titus Mould, Student-SLP

## 2016-01-02 NOTE — Progress Notes (Addendum)
Nutrition Consult/Follow Up  DOCUMENTATION CODES:   Not applicable  INTERVENTION:    Continue Glucerna 1.2 formula at goal rate of 65 ml/hr  Above TF regimen prrovides 1872 kcal, 94 grams of protein, 1256 ml of free water  NEW NUTRITION DIAGNOSIS:   Increased nutrient needs related to chronic illness, wound healing as evidenced by estimated needs, ongoing  GOAL:   Patient will meet greater than or equal to 90% of their needs, met  MONITOR:   TF tolerance, PO intake, Labs, Weight trends, Skin, I & O's  ASSESSMENT:   Jason [name] is a 30 y.o. 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 He is followed in trach clinic and he has been doing well and has not required any ventilator support for quite some time, last time roughly November 2016. He had been able to use PMV 24 hours a day without problem.   Patient is currently on trach collar.  Patient currently receiving Glucerna 1.2 @ 65 ml/hr via PEG tube provides 1872 kcal, 94 grams of protein, 1256 ml of free water.   S/p bedside swallow evaluation 3/27.  Diet advanced to Regular.  No % PO intake available yet.  CWOCN note 3/27 reviewed.   Palliative Care Team continues to follow.  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  Estimated Nutritional Needs:   Kcal:  1600-1800  Protein:  90-110 gm  Fluid:  >/= 1.7 L  EDUCATION NEEDS:   No education needs identified at this time  Arthur Holms, RD, LDN Pager #: [phone] After-Hours Pager #: [phone]

## 2016-01-02 NOTE — Progress Notes (Signed)
[facility] for Infectious Disease   Reason for visit: Follow up on VAP  Interval History: afebrile, now 48 hours on trach collar, alert and back to his baseline  Physical Exam: Constitutional:  Filed Vitals:   01/02/16 0739 01/02/16 0753  BP:  104/73  Pulse: 81 84  Temp:  98 F (36.7 C)  Resp: 18 23  awake, alert, interactive Eyes: anicteric HENT: + trach with trach collar Respiratory: + rhonchi Cardiovascular: Tachy RR   Review of Systems: Constitutional: negative for fevers and chills Gastrointestinal: negative for nausea and diarrhea All other systems reviewed and are negative   Lab Results  Component Value Date   WBC 25.5* 01/02/2016   HGB 8.8* 01/02/2016   HCT 27.8* 01/02/2016   MCV 80.3 01/02/2016   PLT 909* 01/02/2016    Lab Results  Component Value Date   CREATININE 1.05 01/02/2016   BUN 51* 01/02/2016   NA 128* 01/02/2016   K 5.5* 01/02/2016   CL 99* 01/02/2016   CO2 20* 01/02/2016    Lab Results  Component Value Date   ALT 18 12/30/2015   AST 18 12/30/2015   ALKPHOS 98 12/30/2015     Microbiology: Recent Results (from the past 240 hour(s))  Culture, respiratory (NON-Expectorated)     Status: None   Collection Time: 12/24/15  1:45 PM  Result Value Ref Range Status   Specimen Description TRACHEAL ASPIRATE  Final   Special Requests Normal  Final   Gram Stain   Final    ABUNDANT WBC PRESENT, PREDOMINANTLY PMN FEW SQUAMOUS EPITHELIAL CELLS PRESENT NO ORGANISMS SEEN THIS SPECIMEN IS ACCEPTABLE FOR SPUTUM CULTURE Performed at [facility]    Culture   Final    RARE METHICILLIN RESISTANT STAPHYLOCOCCUS AUREUS 22 Note: RIFAMPIN AND GENTAMICIN SHOULD NOT BE USED AS SINGLE DRUGS FOR TREATMENT OF STAPH INFECTIONS. CRITICAL RESULT CALLED TO, READ BACK BY AND VERIFIED WITH: TROYCE H. 3 17 @ 0821 BY PARDA Performed at [facility]    Report Status 12/28/2015 FINAL  Final   Organism ID, Bacteria METHICILLIN RESISTANT  STAPHYLOCOCCUS AUREUS  Final      Susceptibility   Methicillin resistant staphylococcus aureus - MIC*    CLINDAMYCIN >=8 RESISTANT Resistant     ERYTHROMYCIN >=8 RESISTANT Resistant     GENTAMICIN <=0.5 SENSITIVE Sensitive     LEVOFLOXACIN >=8 RESISTANT Resistant     OXACILLIN >=4 RESISTANT Resistant     RIFAMPIN <=0.5 SENSITIVE Sensitive     TRIMETH/SULFA >=320 RESISTANT Resistant     VANCOMYCIN 1 SENSITIVE Sensitive     TETRACYCLINE >=16 RESISTANT Resistant     * RARE METHICILLIN RESISTANT STAPHYLOCOCCUS AUREUS  Culture, blood (routine x 2)     Status: None   Collection Time: 12/25/15  6:46 PM  Result Value Ref Range Status   Specimen Description BLOOD LEFT ARM  Final   Special Requests IN PEDIATRIC BOTTLE 3CC  Final   Culture NO GROWTH 5 DAYS  Final   Report Status 12/30/2015 FINAL  Final  Culture, blood (routine x 2)     Status: None   Collection Time: 12/25/15  6:52 PM  Result Value Ref Range Status   Specimen Description BLOOD LEFT ARM  Final   Special Requests IN PEDIATRIC BOTTLE 3CC  Final   Culture NO GROWTH 5 DAYS  Final   Report Status 12/30/2015 FINAL  Final    Impression/Plan:  1. VAP - MDR Pseudomonas.  On cefepime. Use through tomorrow and  stop I have stopped vancomycin  2. Chronic respiratory failure - multiple infections in the past.  Weaned.   Discussed with mother at bedside, she is asking about having oxygen at home as needed, he currently does not have oxygen now.  Will defer to pulmonary.   I will follow up as needed. thanks

## 2016-01-02 NOTE — Consult Note (Addendum)
WOC follow-up: Discussed plan of care with patient's mother at the bedside. Patient continues to leak yellow drainage from rectum around [facility]. Vac dressing will be difficult to maintain a seal related to the close proximity to the rectum and constant moisture to surrounding skin. Continue moist gauze packing to bilat ischium and sacrum wounds until this problem is resolved.Mother states patient might discharge on Wed and she would like to attempt to re-apply home Vac  To wounds at that time; will plan to attempt application of their Medella negative pressure unit at 0900 on Wed as requested.  Julien Girt MSN, RN, [place], [place], [place]

## 2016-01-03 ENCOUNTER — Other Ambulatory Visit: Payer: Self-pay | Admitting: Family Medicine

## 2016-01-03 DIAGNOSIS — J9621 Acute and chronic respiratory failure with hypoxia: Secondary | ICD-10-CM

## 2016-01-03 LAB — CBC
HCT: 25.6 % — ABNORMAL LOW (ref 39.0–52.0)
Hemoglobin: 7.8 g/dL — ABNORMAL LOW (ref 13.0–17.0)
MCH: 24.4 pg — ABNORMAL LOW (ref 26.0–34.0)
MCHC: 30.5 g/dL (ref 30.0–36.0)
MCV: 80 fL (ref 78.0–100.0)
PLATELETS: 913 10*3/uL — AB (ref 150–400)
RBC: 3.2 MIL/uL — AB (ref 4.22–5.81)
RDW: 17.3 % — AB (ref 11.5–15.5)
WBC: 25 10*3/uL — AB (ref 4.0–10.5)

## 2016-01-03 LAB — GLUCOSE, CAPILLARY
GLUCOSE-CAPILLARY: 166 mg/dL — AB (ref 65–99)
GLUCOSE-CAPILLARY: 219 mg/dL — AB (ref 65–99)
Glucose-Capillary: 275 mg/dL — ABNORMAL HIGH (ref 65–99)
Glucose-Capillary: 116 mg/dL — ABNORMAL HIGH (ref 65–99)
Glucose-Capillary: 195 mg/dL — ABNORMAL HIGH (ref 65–99)
Glucose-Capillary: 153 mg/dL — ABNORMAL HIGH (ref 65–99)

## 2016-01-03 LAB — BASIC METABOLIC PANEL
Anion gap: 9 (ref 5–15)
BUN: 48 mg/dL — AB (ref 6–20)
CALCIUM: 9 mg/dL (ref 8.9–10.3)
CHLORIDE: 97 mmol/L — AB (ref 101–111)
CO2: 21 mmol/L — ABNORMAL LOW (ref 22–32)
CREATININE: 1.16 mg/dL (ref 0.61–1.24)
Glucose, Bld: 260 mg/dL — ABNORMAL HIGH (ref 65–99)
Potassium: 4.3 mmol/L (ref 3.5–5.1)
SODIUM: 127 mmol/L — AB (ref 135–145)

## 2016-01-03 LAB — PATHOLOGIST SMEAR REVIEW

## 2016-01-03 MED ORDER — CAMPHOR-MENTHOL 0.5-0.5 % EX LOTN
TOPICAL_LOTION | CUTANEOUS | Status: DC | PRN
  Administered 2016-01-03: 1 via TOPICAL
  Filled 2016-01-03: qty 222

## 2016-01-03 MED ORDER — CAMPHOR-MENTHOL 0.5-0.5 % EX LOTN: TOPICAL_LOTION | CUTANEOUS | Status: DC | PRN

## 2016-01-03 MED ORDER — OXYCODONE HCL 5 MG PO TABS
20.0000 mg | ORAL_TABLET | Freq: Four times a day (QID) | ORAL | Status: DC | PRN
Start: 2016-01-03 — End: 2016-01-05
  Administered 2016-01-03 – 2016-01-05 (×6): 20 mg via ORAL
  Filled 2016-01-03 (×6): qty 4

## 2016-01-03 MED ORDER — OXYCODONE HCL 5 MG PO TABS: 10.0000 mg | ORAL_TABLET | ORAL | Status: DC | PRN

## 2016-01-03 NOTE — Progress Notes (Signed)
Inpatient Diabetes Program Recommendations  AACE/ADA: New Consensus Statement on Inpatient Glycemic Control (2015)  Target Ranges:  Prepandial:   less than 140 mg/dL      Peak postprandial:   less than 180 mg/dL (1-2 hours)      Critically ill patients:  140 - 180 mg/dL   Results for NARENDER, PEDUZZI (MRN [id number]) as of 01/03/2016 07:17  Ref. Range 01/02/2016 00:21 01/02/2016 04:45 01/02/2016 07:51 01/02/2016 12:36 01/02/2016 16:51 01/02/2016 19:51  Glucose-Capillary Latest Ref Range: 65-99 mg/dL 110 (H) 310 (H) 273 (H) 106 (H) 307 (H) 407 (H)   Results for LENYN, WEEBER (MRN [id number]) as of 01/03/2016 07:17  Ref. Range 01/03/2016 00:03 01/03/2016 04:02  Glucose-Capillary Latest Ref Range: 65-99 mg/dL 219 (H) 275 (H)    Home DM Meds: Lantus 15 units QHS       Novolog 0-10 units QID  Current Insulin Orders: Lantus 20 units daily      Novolog Resistant Correction Scale/ SSI (0-20 units) Q4 hours      MD- Note patient receiving Glucerna tube feedings at 65cc/hour.  Also receiving IV Solumedrol 20 mg bid.  Having sustained elevated glucose levels.  Note that patient has also had issues with extremely labile CBGs.  Please consider starting low dose Novolog tube feed coverage to help prevent CBGs from spiking to 300-400 mg/dl range.  Novolog 3 units Q4 hours (hold if tube feeds held for any reason)  If you add Novolog tube feed coverage, may want to reduce Novolog Correction Scale/ SSI to Moderate scale (0-15 units) Q4 hours (currently ordered as Resistant scale 0-20 units Q4 hours)       --Will follow patient during hospitalization--  Wyn Quaker RN, MSN, CDE Diabetes Coordinator Inpatient Glycemic Control Team Team Pager: [phone] (8a-5p)

## 2016-01-03 NOTE — Progress Notes (Signed)
Pharmacy Antibiotic Note  4 YOM with MDR pseudomonas PNA and CoNS bacteremia.  Pharmacy managing Cefepime.  Patient's allergic reaction to cefuroxime is anaphylaxis; however, cefuroxime and cefepime are not structurally similar.  Mother is aware of risk per MD. Currently tolerating Cefepime with premedications. Wbc remains elevated at 25   Plan: Cefepime to stop today All abx completed   Height: 5\' 8"  (172.7 cm) Weight: 116 lb 8 oz (52.844 kg) IBW/kg (Calculated) : 68.4  Temp (24hrs), Avg:98.2 F (36.8 C), Min:97.4 F (36.3 C), Max:99.3 F (37.4 C)   Recent Labs Lab 12/30/15 0445 12/30/15 1337 12/31/15 0400 01/01/16 0457 01/02/16 0500 01/03/16 0445  WBC 33.2*  --  34.1* 31.7* 25.5* 25.0*  CREATININE 1.61*  --  1.39* 1.22 1.05 1.16  VANCOTROUGH  --  15  --   --   --   --     Estimated Creatinine Clearance: 68.9 mL/min (by C-G formula based on Cr of 1.16).    Allergies  Allergen Reactions  . Cefuroxime Axetil Anaphylaxis    Tolerated cefepime 12/2015 - with Pepcid/Solu-Medrol  . Ertapenem Other (See Comments)    Rash and confusion-->tolerated Imipenem   . Morphine And Related Other (See Comments)    Changed mental status, confusion, headache, visual hallucination  . Penicillins Anaphylaxis and Other (See Comments)    Tolerated Imipenem; no reaction to 7 day course of amoxicillin in 2015 Has patient had a PCN reaction causing immediate rash, facial/tongue/throat swelling, SOB or lightheadedness with hypotension: Yes Has patient had a PCN reaction causing severe rash involving mucus membranes or skin necrosis: No Has patient had a PCN reaction that required hospitalization Yes Has patient had a PCN reaction occurring within the last 10 years: No If all of the above answers are "NO", then may proceed with Cephalo  . Sulfa Antibiotics Anaphylaxis, Shortness Of Breath and Other (See Comments)  . Tessalon [Benzonatate] Anaphylaxis  . Shellfish Allergy Itching and Other (See  Comments)    Took benadryl to alleviate reaction  . Levaquin [Levofloxacin In D5w] Swelling    Hand and forearm swelling  . Nsaids Itching and Other (See Comments)    Risk of bleeding, itching  . Miripirium Rash and Other (See Comments)    Change in mental status    Antimicrobials this admission: Vanc 3/1 >> 3/7, 3/14 >>3/27 Levaquin 3/1 >> 3/7 Primaxin x 1 2/28 Aztreonam 3/7 >> 3/9 Tigecycline 3/7 >> 3/9 Colistin 3/9 >> 3/13 Fluc 3/9 >>3/22 Amikacin 3/16 >>3/21 Cefepime 3/20 >>3/28  Dose adjustments this admission: 3/2 VT = 19 mcg/mL 3/17 0530 VR = 35 on 500mg  q24 (obtained prior to 3rd dose) 3/19 0500 VR = 15, gave vanc 500mg  x1 3/20 0900 VR = 17 mcg/mL >> vanc 750mg  q48 (SCr 2.3) 3/24 1030 VT = 15 >> continue Vanc 750 mg IV Q66f  Microbiology results: 2/28 UCx >> mult species FINAL 2/28 BCx x2>> ngF 3/1 BCx x2>> ngF 3/1 MRSA PCR >> positive 3/6 TA - Pseudomonas 3/6  blood x 2:  NF 3/7  Urine:  > 100K yeast 3/13 C.diff: neg 3/12 blood cx: CoNS - pending 3/14 blood cx: NegF 3/19 BCx x2 - NegF   Levester Fresh, PharmD, BCPS, [facility] Clinical Pharmacist Pager [phone] 01/03/2016 9:51 AM

## 2016-01-03 NOTE — Progress Notes (Signed)
PATIENT DETAILS Name: Jason [name] Age: 30 y.o. 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 Continued to require ventilator support during this hospital stay-PCCM attempting to wean him off the ventilator-has been on a trach collar for the past 24 hours. Infectious disease directing antibiotic therapy-unfortunately patient has multiple allergies to antibiotics. Slowly improving.  Significant Events: 2/28 admit to [facility] by PCCM 3/2 no fevers, low secretions, vented, refusing Chest PT and other treatments 3/3 improved to TC, pcxr better 3/5 trach collar 24 Hrs 3/6 Tmax 103.2 3/8 still spiking temps  3/9 Colistin started  3/13 encephalopathic, rising creatinine, hyperkalemic 3/14 remains encephalopathic - 1/2 blood cx's with GPCs 3/16 unresponsive - resp pattern worse, agonal - added inhaled amikacin, placed back on vent 3/21-transferred to [facility]  Subjective: Awake and alert this morning. On trach collar-for the past 72 hours-and not requiring any ventilator support. Mother at bedside-no major events overnight.  Assessment/Plan: Acute on chronic hypoxic respiratory failure/tracheostomy dependent: Secondary to multidrug resistant Pseudomonas pneumonia and ?MRSA. PCCM following, and attempting to wean him off the ventilator-now able to be off the ventilator for the past 72 hours and is on a trach collar. Will continue to follow and await further recommendations from PCCM  Sepsis secondary to VAP/ MDR Pseudeomonas and MRSA GLO:VFIEP resolved, leukocytosis has started to  downtrend. Given multiple allergies, drug resistant bacteria-limited options. Infectious disease following an directing care. Currently on cefepime (premedication with steroids and Pepcid) through 3/28. Vancomycin discontinued on 3/27. Sepsis pathophysiology has resolved.  Acute renal failure: Likely secondary to prerenal azotemia-setting of sepsis. Resolved with supportive care. Follow periodically  Hyperkalemia: resolved with Kayexalate 2 today, follow Iytes   Acute encephalopathy: Likely toxic met encephalopathy from sepsis. Seems to have resolved, is awake and alert this morning.  Continue IV antibiotics.EEG 03/13-without any seizure-like activity, but moderate diffuse slowing. Follow.  Yeast UTI: Completed a course of fluconazole-per infectious disease   ? Seizure: Thought to be secondary to fever, and potential drug reaction from inhaled amikacin. No further seizures since then.EEG 03/13-without any seizure. Monitor and follow closely.  Chronic hypotension: Continue Midodrine-BP stable for now  Anemia: Likely secondary to acute illness on top of chronic disease. Hemoglobin stable, follow trend.  Thrombocytosis: Likely reflecting chronic inflammation-acute phase reactant. No indication of Fe deficiency on Fe panel. Spoke with Dr Lindi Adie over the phone-since reactive-no further intervention/treatment required.  Hyponatremia:stop Free water-and follow lytes  History of gastroparesis: Likely secondary to diabetes, no vomiting-seem stable at present. Follow  Dysphagia: Kept NPO while on a ventilator-we will ask speech therapy to evaluate and start appropriate diet. Continue PEG tube feedings, will ask nutrition to see if we can resume nocturnal feeds.  Severe protein calorie malnutrition: continue peg tube feeds-see above  Stage IV sacral ulcers/stage III left heel ulcer/Full thickness wound mid back:resent prior to admission, continues to be followed wound care RN. Has a left lower  quadrant colostomy in place  Type 1 diabetes: CBGs uncontrolled-but on steroids-increase Lantus to 20 units, continue SSI. We will try to allow some permissive hyperglycemia, as pt has hx of rapidly developing hypoglycemia  when attempts are made to strictly control his CBG   Chronic pain syndrome: Secondary to extensive sacral ulcers-continue transdermal fentanyl/oxycodone. Agree with Dr. Garen Lah assessment of resuming judicious narcotics and antispasmodic medications to accomplish comfort. Unfortunate 30 year old is deteriorating slowly, and do not wish to see him suffer at this time.  Anxiety: Continue Ativan as needed.  History of neurogenic bladder: Supra pubic catheter in place.  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. IVC filter placed 8/6. Review of prior documentation-not on A/C due to need for frequent debridements which have caused extensive bleeding, previously on apixaban-on prophylactic Heparin currently   History of spinal cord infarct with paraplegia  Palliative care: Unfortunate 30 year old patient with prior spinal cord injury with resultant paraplegia-chronic tracheostomy-multiple infections in the past with multidrug resistant organisms-multiple allergies to numerous antibiotics-brought into the hospital for worsening hypoxic respiratory failure secondary to ventilator associated pneumonia from multidrug resistant Pseudomonas. Unfortunately given severe deconditioning, not be able to be weaned off the ventilator. Hospital course complicated by worsening sepsis and renal failure in spite of broad spectrum antibiotics. Family has had multiple discussions with critical care MDs, palliative care M.D.-currently he remains a full code. However family is aware of very poor overall prognoses.  Disposition: Remain inpatient-home tomorrow  Antimicrobial agents  See below  Anti-infectives    Start     Dose/Rate Route Frequency Ordered Stop   12/31/15 1300  vancomycin (VANCOCIN) 500 mg in  sodium chloride 0.9 % 100 mL IVPB  Status:  Discontinued     500 mg 100 mL/hr over 60 Minutes Intravenous Every 24 hours 12/31/15 0901 01/02/16 1114   12/26/15 1100  vancomycin (VANCOCIN) IVPB 750 mg/150 ml premix  Status:  Discontinued     750 mg 150 mL/hr over 60 Minutes Intravenous Every 48 hours 12/26/15 1014 12/31/15 0901   12/26/15 1100  ceFEPIme (MAXIPIME) 1 g in dextrose 5 % 50 mL IVPB     1 g 100 mL/hr over 30 Minutes Intravenous Every 12 hours 12/26/15 1014 01/03/16 2359   12/25/15 0830  vancomycin (VANCOCIN) 500 mg in sodium chloride 0.9 % 100 mL IVPB     500 mg 100 mL/hr over 60 Minutes Intravenous  Once 12/25/15 0816 12/25/15 1046   12/22/15 1200  amikacin (AMIKIN) injection 500 mg  Status:  Discontinued     500 mg Inhalation 2 times daily 12/22/15 0950 12/27/15 0030   12/21/15 1030  vancomycin (VANCOCIN) 500 mg in sodium chloride 0.9 % 100 mL IVPB  Status:  Discontinued     500 mg 100 mL/hr over 60 Minutes Intravenous Every 24 hours 12/20/15 1156 12/23/15 0954   12/20/15 2300  vancomycin (VANCOCIN) 500 mg in sodium chloride 0.9 % 100 mL IVPB  Status:  Discontinued     500 mg 100 mL/hr over 60 Minutes Intravenous Every 12 hours 12/20/15 1145 12/20/15 1156   12/20/15 0930  vancomycin (VANCOCIN) 1,250 mg in sodium chloride 0.9 % 250 mL IVPB     1,250 mg 166.7 mL/hr over 90 Minutes Intravenous  Once 12/20/15 0858 12/20/15 1211   12/15/15 1300  fluconazole (DIFLUCAN) tablet 100 mg  Status:  Discontinued     100 mg Oral Daily 12/15/15 1211 12/28/15 1129   12/15/15 1230  colistimethate (COLYMYCIN) 137.25 mg in sodium chloride 0.9 % 100 mL IVPB  Status:  Discontinued     5 mg/kg/day  54.9 kg 203.7 mL/hr over 30 Minutes Intravenous Every 12 hours 12/15/15 1211 12/19/15 1147   12/13/15 2200  tigecycline (TYGACIL) 50 mg  in sodium chloride 0.9 % 100 mL IVPB  Status:  Discontinued     50 mg 200 mL/hr over 30 Minutes Intravenous Every 12 hours 12/13/15 0856 12/15/15 1211   12/13/15  1000  tigecycline (TYGACIL) 100 mg in sodium chloride 0.9 % 100 mL IVPB     100 mg 200 mL/hr over 30 Minutes Intravenous  Once 12/13/15 0856 12/13/15 1117   12/13/15 1000  aztreonam (AZACTAM) 2 g in dextrose 5 % 50 mL IVPB  Status:  Discontinued     2 g 100 mL/hr over 30 Minutes Intravenous 3 times per day 12/13/15 0856 12/15/15 1211   12/07/15 0600  imipenem-cilastatin (PRIMAXIN) 500 mg in sodium chloride 0.9 % 100 mL IVPB  Status:  Discontinued     500 mg 200 mL/hr over 30 Minutes Intravenous 4 times per day 12/06/15 2311 12/06/15 2329   12/07/15 0000  levofloxacin (LEVAQUIN) IVPB 750 mg  Status:  Discontinued     750 mg 100 mL/hr over 90 Minutes Intravenous Every 24 hours 12/06/15 2339 12/13/15 0856   12/06/15 2315  vancomycin (VANCOCIN) 500 mg in sodium chloride 0.9 % 100 mL IVPB  Status:  Discontinued     500 mg 100 mL/hr over 60 Minutes Intravenous Every 12 hours 12/06/15 2311 12/13/15 0856   12/06/15 2230  imipenem-cilastatin (PRIMAXIN) 500 mg in sodium chloride 0.9 % 100 mL IVPB     500 mg 200 mL/hr over 30 Minutes Intravenous  Once 12/06/15 2209 12/06/15 2335      DVT Prophylaxis: Prophylactic  Heparin   Code Status: Full code   Family Communication Mother at bedside  Procedures: Chronic trach / PEG >> Suprapubic catheter changed 3/8  CONSULTS:  pulmonary/intensive care, ID and Palliative care  Time spent 20 minutes-Greater than 50% of this time was spent in counseling, explanation of diagnosis, planning of further management, and coordination of care.  MEDICATIONS: Scheduled Meds: . acetaminophen (TYLENOL) oral liquid 160 mg/5 mL  500 mg Per Tube TID  . antiseptic oral rinse  7 mL Mouth Rinse QID  . baclofen  5 mg Per Tube QID  . buPROPion  150 mg Oral Daily  . ceFEPime (MAXIPIME) IV  1 g Intravenous Q12H  . chlorhexidine gluconate (SAGE KIT)  15 mL Mouth Rinse BID  . famotidine (PEPCID) IV  20 mg Intravenous Q12H  . fentaNYL  50 mcg Transdermal Q72H  .  heparin  5,000 Units Subcutaneous 3 times per day  . insulin aspart  0-20 Units Subcutaneous 6 times per day  . insulin glargine  20 Units Subcutaneous Daily  . ipratropium-albuterol  3 mL Nebulization QID  . LORazepam  0.5 mg Per Tube TID  . methylPREDNISolone (SOLU-MEDROL) injection  20 mg Intravenous Q12H  . midodrine  5 mg Per Tube TID AC  . mupirocin cream   Topical Daily  . naLOXone [facility])  injection  0.4 mg Intramuscular Once   Continuous Infusions: . sodium chloride 10 mL/hr at 01/02/16 2329  . feeding supplement (GLUCERNA 1.2 CAL) 1,000 mL (01/03/16 0943)   PRN Meds:.acetaminophen, albuterol, camphor-menthol, dextrose, diphenhydrAMINE, EPINEPHrine, hydrALAZINE, LORazepam, oxyCODONE, sodium chloride flush    PHYSICAL EXAM: Vital signs in last 24 hours: Filed Vitals:   01/03/16 0735 01/03/16 0751 01/03/16 1142 01/03/16 1143  BP: 106/69     Pulse: 89  91   Temp:  99 F (37.2 C)    TempSrc:  Oral    Resp: 18  18   Height:  Weight:      SpO2: 99%  97% 98%    Weight change: 0.344 kg (12.1 oz) Filed Weights   01/01/16 0441 01/02/16 0500 01/03/16 0415  Weight: 54.114 kg (119 lb 4.8 oz) 52.5 kg (115 lb 11.9 oz) 52.844 kg (116 lb 8 oz)   Body mass index is 17.72 kg/(m^2).   Gen Exam: Awake and alert this morning. On trach collar Neck: Supple   Chest: Clear to auscultation anteriorly CVS: S1 S2 Regular  Abdomen: soft, BS +, non tender, PEG tube in place, suprapubic catheter in place. Left lower quadrant colostomy in place Extremities: no edema, lower extremities warm to touch.   Intake/Output from previous day:  Intake/Output Summary (Last 24 hours) at 01/03/16 1159 Last data filed at 01/03/16 0752  Gross per 24 hour  Intake   2515 ml  Output   4000 ml  Net  -1485 ml     LAB RESULTS: CBC  Recent Labs Lab 12/30/15 0445 12/31/15 0400 01/01/16 0457 01/02/16 0500 01/03/16 0445  WBC 33.2* 34.1* 31.7* 25.5* 25.0*  HGB 8.5* 8.6* 8.4* 8.8* 7.8*  HCT  28.9* 27.7* 28.3* 27.8* 25.6*  PLT 725* 729* 843* 909* 913*  MCV 81.9 81.7 81.3 80.3 80.0  MCH 24.1* 25.4* 24.1* 25.4* 24.4*  MCHC 29.4* 31.0 29.7* 31.7 30.5  RDW 17.3* 17.3* 17.3* 17.7* 17.3*  LYMPHSABS 1.7  --  1.9 1.2  --   MONOABS 1.3*  --  0.6 1.3*  --   EOSABS 0.0  --  0.0 0.1  --   BASOSABS 0.0  --  0.0 0.0  --     Chemistries   Recent Labs Lab 12/30/15 0445 12/31/15 0400 01/01/16 0457 01/02/16 0500 01/03/16 0445  NA 132* 136 130* 128* 127*  K 5.4* 5.4* 5.8* 5.5* 4.3  CL 103 107 101 99* 97*  CO2 19* 19* 21* 20* 21*  GLUCOSE 453* 343* 445* 365* 260*  BUN 67* 62* 58* 51* 48*  CREATININE 1.61* 1.39* 1.22 1.05 1.16  CALCIUM 9.8 9.8 9.6 9.5 9.0    CBG:  Recent Labs Lab 01/02/16 1651 01/02/16 1951 01/03/16 0003 01/03/16 0402 01/03/16 0749  GLUCAP 307* 407* 219* 275* 166*    GFR Estimated Creatinine Clearance: 68.9 mL/min (by C-G formula based on Cr of 1.16).  Coagulation profile No results for input(s): INR, PROTIME in the last 168 hours.  Cardiac Enzymes No results for input(s): CKMB, TROPONINI, MYOGLOBIN in the last 168 hours.  Invalid input(s): CK  Invalid input(s): POCBNP No results for input(s): DDIMER in the last 72 hours. No results for input(s): HGBA1C in the last 72 hours. No results for input(s): CHOL, HDL, LDLCALC, TRIG, CHOLHDL, LDLDIRECT in the last 72 hours. No results for input(s): TSH, T4TOTAL, T3FREE, THYROIDAB in the last 72 hours.  Invalid input(s): FREET3  Recent Labs  01/02/16 0900 01/02/16 1100  VITAMINB12 1102*  --   FOLATE  --  11.4  FERRITIN 1500*  --   TIBC 353  --   IRON 94  --   RETICCTPCT 2.0  --    No results for input(s): LIPASE, AMYLASE in the last 72 hours.  Urine Studies No results for input(s): UHGB, CRYS in the last 72 hours.  Invalid input(s): UACOL, UAPR, USPG, UPH, UTP, UGL, UKET, UBIL, UNIT, UROB, ULEU, UEPI, UWBC, URBC, UBAC, CAST, UCOM, BILUA  MICROBIOLOGY: Recent Results (from the past 240  hour(s))  Culture, respiratory (NON-Expectorated)     Status: None   Collection Time: 12/24/15  1:45 PM  Result Value Ref Range Status   Specimen Description TRACHEAL ASPIRATE  Final   Special Requests Normal  Final   Gram Stain   Final    ABUNDANT WBC PRESENT, PREDOMINANTLY PMN FEW SQUAMOUS EPITHELIAL CELLS PRESENT NO ORGANISMS SEEN THIS SPECIMEN IS ACCEPTABLE FOR SPUTUM CULTURE Performed at [facility]    Culture   Final    RARE METHICILLIN RESISTANT STAPHYLOCOCCUS AUREUS 22 Note: RIFAMPIN AND GENTAMICIN SHOULD NOT BE USED AS SINGLE DRUGS FOR TREATMENT OF STAPH INFECTIONS. CRITICAL RESULT CALLED TO, READ BACK BY AND VERIFIED WITH: TROYCE H. 3 17 @ 0821 BY PARDA Performed at [facility]    Report Status 12/28/2015 FINAL  Final   Organism ID, Bacteria METHICILLIN RESISTANT STAPHYLOCOCCUS AUREUS  Final      Susceptibility   Methicillin resistant staphylococcus aureus - MIC*    CLINDAMYCIN >=8 RESISTANT Resistant     ERYTHROMYCIN >=8 RESISTANT Resistant     GENTAMICIN <=0.5 SENSITIVE Sensitive     LEVOFLOXACIN >=8 RESISTANT Resistant     OXACILLIN >=4 RESISTANT Resistant     RIFAMPIN <=0.5 SENSITIVE Sensitive     TRIMETH/SULFA >=320 RESISTANT Resistant     VANCOMYCIN 1 SENSITIVE Sensitive     TETRACYCLINE >=16 RESISTANT Resistant     * RARE METHICILLIN RESISTANT STAPHYLOCOCCUS AUREUS  Culture, blood (routine x 2)     Status: None   Collection Time: 12/25/15  6:46 PM  Result Value Ref Range Status   Specimen Description BLOOD LEFT ARM  Final   Special Requests IN PEDIATRIC BOTTLE 3CC  Final   Culture NO GROWTH 5 DAYS  Final   Report Status 12/30/2015 FINAL  Final  Culture, blood (routine x 2)     Status: None   Collection Time: 12/25/15  6:52 PM  Result Value Ref Range Status   Specimen Description BLOOD LEFT ARM  Final   Special Requests IN PEDIATRIC BOTTLE 3CC  Final   Culture NO GROWTH 5 DAYS  Final   Report Status 12/30/2015 FINAL  Final    RADIOLOGY  STUDIES/RESULTS: Ct Abdomen Pelvis Wo Contrast  12/10/2015  CLINICAL DATA:  Paralyzed from accident. Rule out fistula from bladder to ostomy. Severe case of MRSA. Decubitus ulcers to back and buttocks. Oral contrast only due to inadequate IV access. EXAM: CT ABDOMEN AND PELVIS WITHOUT CONTRAST TECHNIQUE: Multidetector CT imaging of the abdomen and pelvis was performed following the standard protocol without IV contrast. COMPARISON:  None. FINDINGS: Lower chest: There are bibasilar consolidations which could represent atelectasis or pneumonia. There are also small bilateral pleural effusions. Hepatobiliary: No mass visualized on this un-enhanced exam. Pancreas: No mass or inflammatory process identified on this un-enhanced exam. Spleen: Within normal limits in size. Adrenals/Urinary Tract: Bladder walls appear thickened. Suprapubic catheter in place without obvious complicating feature. Kidneys are unremarkable without stone or hydronephrosis. Stomach/Bowel: Gastrostomy tube in place. Soft tissue defect of the left lower abdominal wall, presumed colostomy. No large or small bowel dilatation. Vascular/Lymphatic: No pathologically enlarged lymph nodes. No evidence of abdominal aortic aneurysm. IVC filter in place, well-positioned just below the level of the renal veins. Reproductive: No mass or other significant abnormality. Other: Small amount of free fluid within the right paracolic gutter and left lower quadrant. No abscess collection identified within the abdomen or pelvis. No free intraperitoneal air. No obvious fistula between the bladder and left lower abdominal ostomy site. Musculoskeletal: Ill-defined edema throughout the subcutaneous soft tissues suggesting anasarca. Soft tissue defects within the  bilateral buttocks region, compatible with the given history of decubitus ulcers. Additional large soft tissue defect overlying the sacrum, again compatible with the given history of decubitus ulcer. Underlying the  decubitus ulcer sites, there are fairly well-defined cortical defects within the posterior right iliac bone and posterior sacrum. IMPRESSION: 1. Circumferential thickening of the walls of the bladder suspicious for cystitis. Recommend correlation with urinalysis. Suprapubic catheter in place. 2. Left lower abdominal wall defect, presumed colostomy. No fistulous connection identified between the bladder and this presumed colostomy. If clinical concern for fistula persists, could consider CT cystogram by injecting contrast into the bladder through the indwelling suprapubic catheter. 3. Small amount of ascites. No intra-abdominal or intrapelvic abscess collection seen. No free intraperitoneal air. 4. Decubitus ulcers of the bilateral buttocks regions and overlying the sacrum. 5. Cortical defects along the posterior margins of the right iliac bone and posterior sacrum, somewhat well-defined, immediately underlying the sites of the decubitus ulcers. I suspect that these are chronic osseous erosions related to the overlying decubitus ulcers. Alternatively, these could be postsurgical changes performed in conjunction with decubitus ulcer debridements if surgical history correlates. Destructive changes of osteomyelitis is certainly an additional possibility. MRI may be helpful for further characterization. 6. Bibasilar consolidations, atelectasis versus pneumonia, with small adjacent pleural effusions. These results will be called to the ordering clinician or representative by the Radiologist Assistant, and communication documented in the PACS or zVision Dashboard. Electronically Signed   By: Franki Cabot M.D.   On: 12/10/2015 18:50   Dg Chest Port 1 View  12/29/2015  CLINICAL DATA:  Tracheostomy patient, healthcare associated pneumonia old with acute and chronic respiratory failure EXAM: PORTABLE CHEST 1 VIEW COMPARISON:  Portable chest x-ray of December 23, 2015 FINDINGS: The lungs are well-expanded. There has been  marked improvement in the appearance of the infiltrate in the right lung. Mild interstitial prominence persists in the mid and lower lung. There is no pleural effusion. The left lung is clear. The heart and pulmonary vascularity are normal. The tracheostomy appliance tip lies at the inferior margin of the clavicular heads which is 5.8 cm above the carina. The right PICC line catheter tip projects over the distal SVC. IMPRESSION: Ongoing improvement of right-sided pneumonia. There is no significant pleural effusion nor evidence of CHF. Electronically Signed   By: David  [place] M.D.   On: 12/29/2015 07:27   Portable Chest Xray  12/23/2015  CLINICAL DATA:  Pneumonia EXAM: PORTABLE CHEST 1 VIEW COMPARISON:  12/22/2015 FINDINGS: Right PICC and tracheostomy tube are stable. Bilateral airspace disease is stable. There is partial collapse of the right lower lobe which is stable. No pneumothorax. IMPRESSION: Stable bilateral airspace disease. Stable partial collapse of the right lower lobe. Electronically Signed   By: Marybelle Killings M.D.   On: 12/23/2015 08:14   Dg Chest Port 1 View  12/22/2015  CLINICAL DATA:  Dyspnea.  Quadriplegia. EXAM: PORTABLE CHEST 1 VIEW COMPARISON:  12/20/2015 FINDINGS: Support apparatus stable. Increased right perihilar and basilar airspace opacity. The patient is rotated to the right on today's radiograph, reducing diagnostic sensitivity and specificity. Deformity of the right distal clavicle, stable. Improved aeration at the left lung base. IMPRESSION: 1. Significant worsening of airspace opacity in the right mid lung and right lung base, nonspecific but potentially from pneumonia. However, there is some improvement in the airspace opacity at the left lung base. Electronically Signed   By: Van Clines M.D.   On: 12/22/2015 09:38   Dg Chest Port 1  View  12/20/2015  CLINICAL DATA:  Respiratory failure EXAM: PORTABLE CHEST 1 VIEW COMPARISON:  December 15, 2015 FINDINGS: Tracheostomy  catheter tip is 4.8 cm above the carina. Central catheter tip is in the superior vena cava near the cavoatrial junction. There is no appreciable pneumothorax. There is airspace consolidation in the medial right base with small right effusion. Left lung is clear. Heart is upper normal in size with pulmonary vascularity within normal limits. No adenopathy. There is lumbar levoscoliosis. IMPRESSION: Tube and catheter positions as described without pneumothorax. Consolidation medial right base with small right effusion. Left lung clear. No change in cardiac silhouette. Electronically Signed   By: Lowella Grip III M.D.   On: 12/20/2015 07:37   Dg Chest Port 1 View  12/15/2015  CLINICAL DATA:  Followup exam.  Pneumonia. EXAM: PORTABLE CHEST 1 VIEW COMPARISON:  12/13/2015 FINDINGS: Persistent lung base opacity noted consistent with combination of pleural fluid and atelectasis. Pneumonia not excluded. No evidence of pulmonary edema. No pneumothorax. Heart, mediastinum hila are unremarkable. Tracheostomy tube is stable in well positioned. IMPRESSION: 1. No change from the previous exam. 2. Persistent lower lung zone opacity consistent with a combination pleural fluid and probable atelectasis. Pneumonia is possible. Electronically Signed   By: Lajean Manes M.D.   On: 12/15/2015 10:10   Dg Chest Port 1 View  12/13/2015  CLINICAL DATA:  Pneumonia. EXAM: PORTABLE CHEST 1 VIEW COMPARISON:  December 09, 2015. FINDINGS: Stable cardiomediastinal silhouette. No pneumothorax is noted. Mildly increased bilateral pleural effusions are noted with probable associated atelectasis. Tracheostomy tube is unchanged in position. Bony thorax is unremarkable. IMPRESSION: Mildly increased bibasilar opacities are noted most consistent with subsegmental atelectasis and associated pleural effusions. Electronically Signed   By: Marijo Conception, M.D.   On: 12/13/2015 10:16   Dg Chest Port 1 View  12/09/2015  CLINICAL DATA:  Pneumonia. EXAM:  PORTABLE CHEST 1 VIEW COMPARISON:  12/08/2015. FINDINGS: Tracheostomy to in stable position. Mediastinum hilar structures normal. Low lung volumes with persistent bibasilar atelectasis and/or infiltrates again noted. Small pleural effusions again noted. No pneumothorax. IMPRESSION: 1. Tracheostomy tube in stable position. 2. Low lung volumes with persistent bibasilar atelectasis and/or infiltrates. Persistent bilateral pleural effusions. Similar findings noted on prior exam. Electronically Signed   By: [place]   On: 12/09/2015 07:20   Dg Chest Port 1 View  12/08/2015  CLINICAL DATA:  Acute respiratory failure. EXAM: PORTABLE CHEST 1 VIEW COMPARISON:  12/07/2015. FINDINGS: Tracheostomy tube in stable position. Heart size stable. Persistent bibasilar atelectasis and/or infiltrates. Small left pleural effusion. No pneumothorax. Right costophrenic angle not imaged. IMPRESSION: 1. Tracheostomy tube in stable position. 2. Persistent bibasilar atelectasis and/or infiltrates without interim clearing. Small left pleural effusion again noted. Electronically Signed   By: Marcello Moores  Register   On: 12/08/2015 07:09   Dg Chest Port 1 View  12/07/2015  CLINICAL DATA:  Acute onset of respiratory failure. Initial encounter. EXAM: PORTABLE CHEST 1 VIEW COMPARISON:  Chest radiograph performed 12/06/2015 FINDINGS: The patient's tracheostomy tube is seen ending 5 cm above the carina. Bibasilar airspace opacities may reflect pneumonia or pulmonary edema. Small bilateral pleural effusions are noted. No pneumothorax is seen. The cardiomediastinal silhouette remains normal in size. No acute osseous abnormalities are identified. IMPRESSION: Bibasilar airspace opacities may reflect pneumonia or pulmonary edema. Small bilateral pleural effusions noted. Electronically Signed   By: Garald Balding M.D.   On: 12/07/2015 06:43   Dg Chest Portable 1 View  12/06/2015  CLINICAL DATA:  30 year old male with sepsis and decreased O2 sats.  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 There are bibasilar opacities with silhouetting of the diaphragm, concerning for pneumonia. There has been interval increase in the left lung base density compared to the prior study. Small bilateral pleural effusions may be present. The cardiac silhouette is within normal limits. No acute osseous pathology. IMPRESSION: Bibasilar airspace densities concerning for pneumonia. Clinical correlation and follow-up recommended. Electronically Signed   By: Anner Crete M.D.   On: 12/06/2015 19:59   Dg Abd Portable 1v  12/18/2015  CLINICAL DATA:  Acute onset of abdominal distention. Initial encounter. EXAM: PORTABLE ABDOMEN - 1 VIEW COMPARISON:  CT of the abdomen and pelvis from 12/10/2015 FINDINGS: The visualized bowel gas pattern is unremarkable. Scattered air and stool filled loops of colon are seen; no abnormal dilatation of small bowel loops is seen to suggest small bowel obstruction. No free intra-abdominal air is identified, though evaluation for free air is limited on a single supine view. The stomach is partially filled with air. An IVC filter is noted. The visualized osseous structures are within normal limits; the sacroiliac joints are unremarkable in appearance. The visualized lung bases are essentially clear. IMPRESSION: Unremarkable bowel gas pattern; no free intra-abdominal air seen. Moderate amount of stool noted in the colon. Stomach partially filled with air. Electronically Signed   By: Garald Balding M.D.   On: 12/18/2015 21:10    Oren Binet, MD  [facility] Pager:[phone]  If 7PM-7AM, please contact night-coverage [email or web address] Password [email or web address] 01/03/2016, 11:59 AM   LOS: 28 days

## 2016-01-03 NOTE — Progress Notes (Signed)
SATURATION QUALIFICATIONS: (This note is used to comply with regulatory documentation for home oxygen)  Patient Saturations on Room Air at Rest = 88%  Patient Saturations on 5 Liters of oxygen at rest = 92%  Please briefly explain why patient needs home oxygen:  Hypoxia

## 2016-01-03 NOTE — Progress Notes (Signed)
PULMONARY / CRITICAL CARE MEDICINE   Name: Jason [name] MRN: [id number] DOB: [birth year]    ADMISSION DATE:  12/06/2015 CONSULTATION DATE:  12/06/15  REFERRING MD:  EDP  CHIEF COMPLAINT:  Decrease in O2 sats, fever, PNA  SUBJECTIVE: Patient is found on trach with PMV on , maintaining sats well, complaining of 9/10 pain on his back.   VITAL SIGNS: BP 106/69 mmHg  Pulse 91  Temp(Src) 99 F (37.2 C) (Oral)  Resp 18  Ht 5\' 8"  (1.727 m)  Wt 116 lb 8 oz (52.844 kg)  BMI 17.72 kg/m2  SpO2 98%  VENTILATOR SETTINGS: Vent Mode:  [-]  FiO2 (%):  [28 %] 28 %  INTAKE / OUTPUT: I/O last 3 completed shifts: In: 75 [P.O.:720; I.V.:360; Other:200; NG/GT:2280; IV Piggyback:200] Out: 5350 [Urine:3700; Stool:1650]  PHYSICAL EXAMINATION: General: Chronically ill --> interacting more this AM. Awake, follows commands Neuro: Opens eyes , gestures to his wife and communicates with her HEENT: PERRL, Clean trach site. Cardiovascular: RRR, no MRG Lungs: diffuse coarse BS.  Abdomen: PEG in place, + BS, ostomy Musculoskeletal: Muscle wasting bilaterally. Skin: No rashes. Sacral wound vac in place.   LABS:  BMET  Recent Labs Lab 01/01/16 0457 01/02/16 0500 01/03/16 0445  NA 130* 128* 127*  K 5.8* 5.5* 4.3  CL 101 99* 97*  CO2 21* 20* 21*  BUN 58* 51* 48*  CREATININE 1.22 1.05 1.16  GLUCOSE 445* 365* 260*   Electrolytes  Recent Labs Lab 01/01/16 0457 01/02/16 0500 01/03/16 0445  CALCIUM 9.6 9.5 9.0   CBC  Recent Labs Lab 01/01/16 0457 01/02/16 0500 01/03/16 0445  WBC 31.7* 25.5* 25.0*  HGB 8.4* 8.8* 7.8*  HCT 28.3* 27.8* 25.6*  PLT 843* 909* 913*   Coag's No results for input(s): APTT, INR in the last 168 hours.  Sepsis Markers No results for input(s): LATICACIDVEN, PROCALCITON, O2SATVEN in the last 168 hours.  ABG No results for input(s): PHART, PCO2ART, PO2ART in the last 168 hours.  Liver Enzymes  Recent Labs Lab 12/29/15 0945 12/30/15 0445   AST 21 18  ALT 18 18  ALKPHOS 85 98  BILITOT 0.4 0.5  ALBUMIN 2.4* 2.4*   Cardiac Enzymes No results for input(s): TROPONINI, PROBNP in the last 168 hours.  Glucose  Recent Labs Lab 01/02/16 1236 01/02/16 1651 01/02/16 1951 01/03/16 0003 01/03/16 0402 01/03/16 0749  GLUCAP 106* 307* 407* 219* 275* 166*   Imaging No results found.  STUDIES:  CXR 02/28 > bibasilar airspace densities concerning for PNA. CT abdo/ pelvis>>>no fistula CXR 3/7 >>mildly increased bibasilar opacities are noted most consistent with subsegmental atelectasis and associated pleural effusions.  EEG 03/13 > mild to mod diffuse slowing with triphasic waves (could be seen with hypoxic/ischemic injury, toxic/metabolic encephalopathies (hepatic?), med effect). CXR 3/14 >> Consolidation medial right base with small right effusion, left lung clear  CULTURES: Blood 02/28 >>No growth  Urine 02/28 >>Multiple species, suggest recollection Blood 3/1>> No growth  Sputum 3/6 >>> few pseudomonas >>>MDR Blood Culture 3/6 >> neg Urine Culture 3/7 >>Yeast  MRSA pos screen Blood 03/12 > GPC's > coag neg staph CDiff 3/13 >> Negative  Blood 3/14 >>  ANTIBIOTICS: Vanc 02/28 (7/7) >> 3/7 Levaquin 02/28 (7/7) >>3/7 Tigecycline 3/7 >> 3/9 Aztreonam 3/7 >> 3/9 Colistin 3/9 >> 3/13 Fluconazole 03/9 > Vanc 03/14 > Inhaled amikacin 3/16>>> Cefepime 3/19>>>  SIGNIFICANT EVENTS: 2/28  Admitted with probable HCAP 3/2- no fevers, low secretions, remains vented, refusing Chest PT and other treatments  3/3 improved to TC, pcxr better 3/5- trach collar 24  Hrs 3/6- Tmax 103.2 3/8 still spiking temps  3/9 Colistin started.  3/13: encephalopathic, rising creatinine, hyperkalemic.  3/14 > remains encephalopathic.  1/2 blood cx's with GPC's.  3/16 : unresponsive. Resp pattern worse, agonal. Adding inhaled amikacin, placed back on vent. Began talks w/ mother and father about EOL. They understand he may not survive. Mom not  ready to make DNR, but says "she is prepared and won't let Jason [name] suffer".  3/27 on trache collar x 48 hrs.   LINES/TUBES: Chronic trach / PEG >> Suprapubic catheter changed 3/8    ASSESSMENT / PLAN:  PULMONARY A: Chronic respiratory failure with trach dependence - Now acute on Chronic respiratory failure on 3/16 w/ agonal resp pattern.  Tracheostomy status MDR pseudeomonas PNA -->concerned that his clinical decline reflects the result of [name] stopping treatment for the pseudomonas. We had hoped that this was a colonizer, but his rising WBC, resp distress & worsening CXR and overall clinical decline would reflect otherwise.  P:  Cont trache collar as tolerated. Vent prn. Has a vent at home. Continue cefepime.  duoneb qid.   CARDIOVASCULAR A:  Chronic hypotension - on midodrine P:  Continue midodrine at current dose.  Monitor hemodynamics.  INFECTIOUS A:   Bacteremia - 1/2 blood cultures from 03/12 with GPC's.  Also with increasing leukocytosis as of 03/14 UTI - hx pseudomonal UTI in past with resistance to multiple abx - positive for yeast  HCAP-->pseudomonas MDR given clinical decline feel that this is more than a colonizer.  Hx sacral wounds P:   ID following - appreciate the assistance. Abx per ID.on cefepime.  WOC following.  . Tentative plan to discharge the patient on Wednesday.  Arrangements for Oxygen are made by the case management. Mother wants him to be on Pain management regimen that he was on before when he will be discharged.     [facility]

## 2016-01-04 ENCOUNTER — Telehealth: Payer: Self-pay | Admitting: Family Medicine

## 2016-01-04 DIAGNOSIS — M869 Osteomyelitis, unspecified: Secondary | ICD-10-CM

## 2016-01-04 DIAGNOSIS — E875 Hyperkalemia: Secondary | ICD-10-CM

## 2016-01-04 DIAGNOSIS — L89159 Pressure ulcer of sacral region, unspecified stage: Secondary | ICD-10-CM

## 2016-01-04 LAB — BASIC METABOLIC PANEL
Anion gap: 7 (ref 5–15)
BUN: 45 mg/dL — AB (ref 6–20)
CHLORIDE: 96 mmol/L — AB (ref 101–111)
CO2: 23 mmol/L (ref 22–32)
Calcium: 9.2 mg/dL (ref 8.9–10.3)
Creatinine, Ser: 1.01 mg/dL (ref 0.61–1.24)
GFR calc non Af Amer: >60 mL/min (ref >60)
Glucose, Bld: 306 mg/dL — ABNORMAL HIGH (ref 65–99)
POTASSIUM: 5.6 mmol/L — AB (ref 3.5–5.1)
SODIUM: 126 mmol/L — AB (ref 135–145)

## 2016-01-04 LAB — OSMOLALITY, URINE: Osmolality, Ur: 373 mOsm/kg (ref 300–900)

## 2016-01-04 LAB — GLUCOSE, CAPILLARY
GLUCOSE-CAPILLARY: 110 mg/dL — AB (ref 65–99)
GLUCOSE-CAPILLARY: 117 mg/dL — AB (ref 65–99)
GLUCOSE-CAPILLARY: 131 mg/dL — AB (ref 65–99)
Glucose-Capillary: 341 mg/dL — ABNORMAL HIGH (ref 65–99)
Glucose-Capillary: 161 mg/dL — ABNORMAL HIGH (ref 65–99)
Glucose-Capillary: 146 mg/dL — ABNORMAL HIGH (ref 65–99)

## 2016-01-04 LAB — SODIUM, URINE, RANDOM: Sodium, Ur: 85 mmol/L

## 2016-01-04 LAB — OSMOLALITY: Osmolality: 287 mOsm/kg (ref 275–295)

## 2016-01-04 LAB — CK: CK TOTAL: 9 U/L — AB (ref 49–397)

## 2016-01-04 LAB — CORTISOL: Cortisol, Plasma: 1.3 ug/dL

## 2016-01-04 MED ORDER — GI COCKTAIL ~~LOC~~
30.0000 mL | Freq: Once | ORAL | Status: AC
  Administered 2016-01-04: 30 mL via ORAL
  Filled 2016-01-04: qty 30

## 2016-01-04 MED ORDER — HYDROCORTISONE 10 MG PO TABS
10.0000 mg | ORAL_TABLET | Freq: Every evening | ORAL | Status: DC
  Administered 2016-01-04: 10 mg via ORAL
  Filled 2016-01-04 (×2): qty 1

## 2016-01-04 MED ORDER — SODIUM POLYSTYRENE SULFONATE 15 GM/60ML PO SUSP
30.0000 g | Freq: Once | ORAL | Status: AC
  Administered 2016-01-04: 30 g via ORAL
  Filled 2016-01-04: qty 120

## 2016-01-04 MED ORDER — SODIUM CHLORIDE 0.9 % IV SOLN: INTRAVENOUS | Status: DC

## 2016-01-04 MED ORDER — SODIUM POLYSTYRENE SULFONATE 15 GM/60ML PO SUSP
30.0000 g | Freq: Once | ORAL | Status: DC
  Filled 2016-01-04: qty 120

## 2016-01-04 MED ORDER — FUROSEMIDE 10 MG/ML IJ SOLN: 20.0000 mg | Freq: Once | INTRAMUSCULAR | Status: DC

## 2016-01-04 MED ORDER — SODIUM CHLORIDE 0.9 % IV SOLN: INTRAVENOUS | Status: AC

## 2016-01-04 MED ORDER — COSYNTROPIN 0.25 MG IJ SOLR: 0.2500 mg | Freq: Once | INTRAMUSCULAR | Status: DC

## 2016-01-04 MED ORDER — SODIUM POLYSTYRENE SULFONATE 15 GM/60ML PO SUSP
30.0000 g | Freq: Two times a day (BID) | ORAL | Status: AC
  Administered 2016-01-04: 30 g
  Filled 2016-01-04 (×2): qty 120

## 2016-01-04 MED ORDER — HYDROCORTISONE 20 MG PO TABS
20.0000 mg | ORAL_TABLET | Freq: Every morning | ORAL | Status: DC
  Administered 2016-01-04 – 2016-01-05 (×2): 20 mg via ORAL
  Filled 2016-01-04 (×3): qty 1

## 2016-01-04 NOTE — Telephone Encounter (Signed)
Pt request refill of the following: fentaNYL (DURAGESIC - dosed mcg/hr) patch 50 mcg  ,  oxyCODONE (Oxy IR/ROXICODONE) immediate release tablet 20 mg   Phamacy:

## 2016-01-04 NOTE — Telephone Encounter (Signed)
This is a duplicate phone note, see previous one.  

## 2016-01-04 NOTE — Progress Notes (Signed)
PATIENT DETAILS Name: NATAN HARTOG Age: 30 y.o. 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 Continued to require ventilator support during this hospital stay-PCCM attempting to wean him off the ventilator-has been on a trach collar for the past 24 hours. Infectious disease directing antibiotic therapy-unfortunately patient has multiple allergies to antibiotics. Slowly improving.  Significant Events: 2/28 admit to [facility] by PCCM 3/2 no fevers, low secretions, vented, refusing Chest PT and other treatments 3/3 improved to TC, pcxr better 3/5 trach collar 24 Hrs 3/6 Tmax 103.2 3/8 still spiking temps  3/9 Colistin started  3/13 encephalopathic, rising creatinine, hyperkalemic 3/14 remains encephalopathic - 1/2 blood cx's with GPCs 3/16 unresponsive - resp pattern worse, agonal - added inhaled amikacin, placed back on vent 3/21-transferred to [facility]  Subjective: Awake and alert this morning. On trach collar-for the past 4 days.  Assessment/Plan: Acute on chronic hypoxic respiratory failure/tracheostomy dependent: Secondary to multidrug resistant Pseudomonas pneumonia and ?MRSA. PCCM following, and attempting to wean him off the ventilator-now able to be off the ventilator for the past 72 hours and is on a trach collar. Will continue to follow and await further recommendations from PCCM  Sepsis secondary to VAP/ MDR Pseudeomonas and MRSA VAP:OLIDC resolved, leukocytosis has started to downtrend. Given multiple allergies, drug resistant bacteria-limited options. Infectious  disease following an directing care. Currently on cefepime (premedication with steroids and Pepcid) through 3/28. Vancomycin discontinued on 3/27. Sepsis pathophysiology has resolved.  Acute renal failure: Likely secondary to prerenal azotemia-setting of sepsis. Resolved with supportive care. Follow periodically  Hyperkalemia: Reoccurring-cortisone level significantly low-but was on IV Solu-Medrol for the past week or so-we will give Kayexalate, and start hydrocortisone to see if it will help with potassium and sodium.  Hyponatremia: Seems relatively euvolemic-underweight slightly lower than usual baseline. Cortisol levels significantly low-but was on steroids to yesterday-start hydrocortisone and follow.  Acute encephalopathy: Likely toxic met encephalopathy from sepsis. Seems to have resolved, is awake and alert this morning.  Continue IV antibiotics.EEG 03/13-without any seizure-like activity, but moderate diffuse slowing. Follow.  Yeast UTI: Completed a course of fluconazole-per infectious disease   ? Seizure: Thought to be secondary to fever, and potential drug reaction from inhaled amikacin. No further seizures since then.EEG 03/13-without any seizure. Monitor and follow closely.  Chronic hypotension: Continue Midodrine-BP stable for now  Anemia: Likely secondary to acute illness on top of chronic disease. Hemoglobin stable, follow trend.  Thrombocytosis: Likely reflecting chronic inflammation-acute phase reactant. No indication of Fe deficiency on Fe panel. Spoke with Dr Lindi Adie over the phone-since reactive-no further intervention/treatment required.  History of gastroparesis: Likely secondary to diabetes, no vomiting-seem stable at present. Follow  Dysphagia: Kept NPO while on a ventilator-we will ask speech therapy to evaluate and start appropriate diet. Continue PEG tube feedings, will ask nutrition to see if we can resume nocturnal feeds.  Severe protein calorie malnutrition:  continue peg tube feeds-see above  Stage IV sacral ulcers/stage III left heel ulcer/Full thickness wound mid back:resent prior to admission, continues to be followed wound care RN. Has a left lower quadrant colostomy in place  Type 1  diabetes: CBGs uncontrolled-but on steroids-increase Lantus to 20 units, continue SSI. We will try to allow some permissive hyperglycemia, as pt has hx of rapidly developing hypoglycemia when attempts are made to strictly control his CBG   Chronic pain syndrome: Secondary to extensive sacral ulcers-continue transdermal fentanyl/oxycodone. Agree with Dr. Garen Lah assessment of resuming judicious narcotics and antispasmodic medications to accomplish comfort. Unfortunate 30 year old is deteriorating slowly, and do not wish to see him suffer at this time.  Anxiety: Continue Ativan as needed.  History of neurogenic bladder: Supra pubic catheter in place.  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. IVC filter placed 8/6. Review of prior documentation-not on A/C due to need for frequent debridements which have caused extensive bleeding, previously on apixaban-on prophylactic Heparin currently   History of spinal cord infarct with paraplegia  Palliative care: Unfortunate 30 year old patient with prior spinal cord injury with resultant paraplegia-chronic tracheostomy-multiple infections in the past with multidrug resistant organisms-multiple allergies to numerous antibiotics-brought into the hospital for worsening hypoxic respiratory failure secondary to ventilator associated pneumonia from multidrug resistant Pseudomonas. Unfortunately given severe deconditioning, not be able to be weaned off the ventilator. Hospital course complicated by worsening sepsis and renal failure in spite of broad spectrum antibiotics. Family has had multiple discussions with critical care MDs, palliative care M.D.-currently he remains a full code. However family is aware of very poor overall  prognoses.  Disposition: Remain inpatient-home in 1-2 days if electrolytes normalize  Antimicrobial agents  See below  Anti-infectives    Start     Dose/Rate Route Frequency Ordered Stop   12/31/15 1300  vancomycin (VANCOCIN) 500 mg in sodium chloride 0.9 % 100 mL IVPB  Status:  Discontinued     500 mg 100 mL/hr over 60 Minutes Intravenous Every 24 hours 12/31/15 0901 01/02/16 1114   12/26/15 1100  vancomycin (VANCOCIN) IVPB 750 mg/150 ml premix  Status:  Discontinued     750 mg 150 mL/hr over 60 Minutes Intravenous Every 48 hours 12/26/15 1014 12/31/15 0901   12/26/15 1100  ceFEPIme (MAXIPIME) 1 g in dextrose 5 % 50 mL IVPB     1 g 100 mL/hr over 30 Minutes Intravenous Every 12 hours 12/26/15 1014 01/03/16 2318   12/25/15 0830  vancomycin (VANCOCIN) 500 mg in sodium chloride 0.9 % 100 mL IVPB     500 mg 100 mL/hr over 60 Minutes Intravenous  Once 12/25/15 0816 12/25/15 1046   12/22/15 1200  amikacin (AMIKIN) injection 500 mg  Status:  Discontinued     500 mg Inhalation 2 times daily 12/22/15 0950 12/27/15 0030   12/21/15 1030  vancomycin (VANCOCIN) 500 mg in sodium chloride 0.9 % 100 mL IVPB  Status:  Discontinued     500 mg 100 mL/hr over 60 Minutes Intravenous Every 24 hours 12/20/15 1156 12/23/15 0954   12/20/15 2300  vancomycin (VANCOCIN) 500 mg in sodium chloride 0.9 % 100 mL IVPB  Status:  Discontinued     500 mg 100 mL/hr over 60 Minutes Intravenous Every 12 hours 12/20/15 1145 12/20/15 1156   12/20/15 0930  vancomycin (VANCOCIN) 1,250 mg in sodium chloride 0.9 % 250 mL IVPB     1,250 mg 166.7 mL/hr over 90 Minutes Intravenous  Once 12/20/15 0858 12/20/15 1211   12/15/15 1300  fluconazole (DIFLUCAN) tablet 100 mg  Status:  Discontinued     100 mg Oral Daily 12/15/15 1211 12/28/15 1129   12/15/15 1230  colistimethate (COLYMYCIN) 137.25 mg in sodium chloride 0.9 % 100 mL IVPB  Status:  Discontinued     5 mg/kg/day  54.9 kg 203.7 mL/hr over 30 Minutes Intravenous Every 12  hours 12/15/15 1211 12/19/15 1147   12/13/15 2200  tigecycline (TYGACIL) 50 mg in sodium chloride 0.9 % 100 mL IVPB  Status:  Discontinued     50 mg 200 mL/hr over 30 Minutes Intravenous Every 12 hours 12/13/15 0856 12/15/15 1211   12/13/15 1000  tigecycline (TYGACIL) 100 mg in sodium chloride 0.9 % 100 mL IVPB     100 mg 200 mL/hr over 30 Minutes Intravenous  Once 12/13/15 0856 12/13/15 1117   12/13/15 1000  aztreonam (AZACTAM) 2 g in dextrose 5 % 50 mL IVPB  Status:  Discontinued     2 g 100 mL/hr over 30 Minutes Intravenous 3 times per day 12/13/15 0856 12/15/15 1211   12/07/15 0600  imipenem-cilastatin (PRIMAXIN) 500 mg in sodium chloride 0.9 % 100 mL IVPB  Status:  Discontinued     500 mg 200 mL/hr over 30 Minutes Intravenous 4 times per day 12/06/15 2311 12/06/15 2329   12/07/15 0000  levofloxacin (LEVAQUIN) IVPB 750 mg  Status:  Discontinued     750 mg 100 mL/hr over 90 Minutes Intravenous Every 24 hours 12/06/15 2339 12/13/15 0856   12/06/15 2315  vancomycin (VANCOCIN) 500 mg in sodium chloride 0.9 % 100 mL IVPB  Status:  Discontinued     500 mg 100 mL/hr over 60 Minutes Intravenous Every 12 hours 12/06/15 2311 12/13/15 0856   12/06/15 2230  imipenem-cilastatin (PRIMAXIN) 500 mg in sodium chloride 0.9 % 100 mL IVPB     500 mg 200 mL/hr over 30 Minutes Intravenous  Once 12/06/15 2209 12/06/15 2335      DVT Prophylaxis: Prophylactic  Heparin   Code Status: Full code   Family Communication Mother at bedside  Procedures: Chronic trach / PEG >> Suprapubic catheter changed 3/8  CONSULTS:  pulmonary/intensive care, ID and Palliative care  Time spent 20 minutes-Greater than 50% of this time was spent in counseling, explanation of diagnosis, planning of further management, and coordination of care.  MEDICATIONS: Scheduled Meds: . acetaminophen (TYLENOL) oral liquid 160 mg/5 mL  500 mg Per Tube TID  . antiseptic oral rinse  7 mL Mouth Rinse QID  . baclofen  5 mg Per Tube  QID  . buPROPion  150 mg Oral Daily  . chlorhexidine gluconate (SAGE KIT)  15 mL Mouth Rinse BID  . [START ON 01/05/2016] cosyntropin  0.25 mg Intravenous Once  . fentaNYL  50 mcg Transdermal Q72H  . heparin  5,000 Units Subcutaneous 3 times per day  . insulin aspart  0-20 Units Subcutaneous 6 times per day  . insulin glargine  20 Units Subcutaneous Daily  . ipratropium-albuterol  3 mL Nebulization QID  . LORazepam  0.5 mg Per Tube TID  . midodrine  5 mg Per Tube TID AC  . mupirocin cream   Topical Daily  . naLOXone [facility])  injection  0.4 mg Intramuscular Once  . sodium polystyrene  30 g Per Tube BID   Continuous Infusions: . sodium chloride 10 mL/hr at 01/02/16 2329  . sodium chloride 900 mL (01/04/16 0915)  . feeding supplement (GLUCERNA 1.2 CAL) 1,000 mL (01/04/16 0057)   PRN Meds:.acetaminophen, albuterol, camphor-menthol, dextrose, diphenhydrAMINE, hydrALAZINE, LORazepam, oxyCODONE, sodium chloride flush    PHYSICAL EXAM: Vital signs in last 24 hours: Filed Vitals:   01/04/16 0436 01/04/16 0719 01/04/16 1133 01/04/16 1207  BP:  102/74  95/69  Pulse:  83  84  Temp: 98.4 F (36.9 C) 97.9 F (36.6 C)  97.4 F (36.3 C)  TempSrc: Oral Oral  Oral  Resp:  18  19  Height:      Weight:      SpO2:  100% 100% 100%    Weight change: 0.816 kg (1 lb 12.8 oz) Filed Weights   01/02/16 0500 01/03/16 0415 01/04/16 0426  Weight: 52.5 kg (115 lb 11.9 oz) 52.844 kg (116 lb 8 oz) 53.661 kg (118 lb 4.8 oz)   Body mass index is 17.99 kg/(m^2).   Gen Exam: Awake and alert this morning. On trach collar Neck: Supple   Chest: Clear to auscultation anteriorly CVS: S1 S2 Regular  Abdomen: soft, BS +, non tender, PEG tube in place, suprapubic catheter in place. Left lower quadrant colostomy in place Extremities: no edema, lower extremities warm to touch.   Intake/Output from previous day:  Intake/Output Summary (Last 24 hours) at 01/04/16 1341 Last data filed at 01/04/16 1300   Gross per 24 hour  Intake   2165 ml  Output   2950 ml  Net   -785 ml     LAB RESULTS: CBC  Recent Labs Lab 12/30/15 0445 12/31/15 0400 01/01/16 0457 01/02/16 0500 01/03/16 0445 01/04/16 0500  WBC 33.2* 34.1* 31.7* 25.5* 25.0* 23.3*  HGB 8.5* 8.6* 8.4* 8.8* 7.8* 8.0*  HCT 28.9* 27.7* 28.3* 27.8* 25.6* 25.9*  PLT 725* 729* 843* 909* 913* 941*  MCV 81.9 81.7 81.3 80.3 80.0 79.9  MCH 24.1* 25.4* 24.1* 25.4* 24.4* 24.7*  MCHC 29.4* 31.0 29.7* 31.7 30.5 30.9  RDW 17.3* 17.3* 17.3* 17.7* 17.3* 17.6*  LYMPHSABS 1.7  --  1.9 1.2  --   --   MONOABS 1.3*  --  0.6 1.3*  --   --   EOSABS 0.0  --  0.0 0.1  --   --   BASOSABS 0.0  --  0.0 0.0  --   --     Chemistries   Recent Labs Lab 12/31/15 0400 01/01/16 0457 01/02/16 0500 01/03/16 0445 01/04/16 0500  NA 136 130* 128* 127* 126*  K 5.4* 5.8* 5.5* 4.3 5.6*  CL 107 101 99* 97* 96*  CO2 19* 21* 20* 21* 23  GLUCOSE 343* 445* 365* 260* 306*  BUN 62* 58* 51* 48* 45*  CREATININE 1.39* 1.22 1.05 1.16 1.01  CALCIUM 9.8 9.6 9.5 9.0 9.2    CBG:  Recent Labs Lab 01/03/16 2003 01/04/16 0016 01/04/16 0434 01/04/16 0753 01/04/16 1205  GLUCAP 153* 117* 341* 161* 110*    GFR Estimated Creatinine Clearance: 80.5 mL/min (by C-G formula based on Cr of 1.01).  Coagulation profile No results for input(s): INR, PROTIME in the last 168 hours.  Cardiac Enzymes No results for input(s): CKMB, TROPONINI, MYOGLOBIN in the last 168 hours.  Invalid input(s): CK  Invalid input(s): POCBNP No results for input(s): DDIMER in the last 72 hours. No results for input(s): HGBA1C in the last 72 hours. No results for input(s): CHOL, HDL, LDLCALC, TRIG, CHOLHDL, LDLDIRECT in the last 72 hours. No results for input(s): TSH, T4TOTAL, T3FREE, THYROIDAB in the last 72 hours.  Invalid input(s): FREET3  Recent Labs  01/02/16 0900 01/02/16 1100  VITAMINB12 1102*  --   FOLATE  --  11.4  FERRITIN 1500*  --   TIBC 353  --   IRON 94  --    RETICCTPCT 2.0  --    No results for input(s): LIPASE, AMYLASE in the last 72  hours.  Urine Studies No results for input(s): UHGB, CRYS in the last 72 hours.  Invalid input(s): UACOL, UAPR, USPG, UPH, UTP, UGL, UKET, UBIL, UNIT, UROB, ULEU, UEPI, UWBC, URBC, UBAC, CAST, UCOM, BILUA  MICROBIOLOGY: Recent Results (from the past 240 hour(s))  Culture, blood (routine x 2)     Status: None   Collection Time: 12/25/15  6:46 PM  Result Value Ref Range Status   Specimen Description BLOOD LEFT ARM  Final   Special Requests IN PEDIATRIC BOTTLE 3CC  Final   Culture NO GROWTH 5 DAYS  Final   Report Status 12/30/2015 FINAL  Final  Culture, blood (routine x 2)     Status: None   Collection Time: 12/25/15  6:52 PM  Result Value Ref Range Status   Specimen Description BLOOD LEFT ARM  Final   Special Requests IN PEDIATRIC BOTTLE 3CC  Final   Culture NO GROWTH 5 DAYS  Final   Report Status 12/30/2015 FINAL  Final    RADIOLOGY STUDIES/RESULTS: Ct Abdomen Pelvis Wo Contrast  12/10/2015  CLINICAL DATA:  Paralyzed from accident. Rule out fistula from bladder to ostomy. Severe case of MRSA. Decubitus ulcers to back and buttocks. Oral contrast only due to inadequate IV access. EXAM: CT ABDOMEN AND PELVIS WITHOUT CONTRAST TECHNIQUE: Multidetector CT imaging of the abdomen and pelvis was performed following the standard protocol without IV contrast. COMPARISON:  None. FINDINGS: Lower chest: There are bibasilar consolidations which could represent atelectasis or pneumonia. There are also small bilateral pleural effusions. Hepatobiliary: No mass visualized on this un-enhanced exam. Pancreas: No mass or inflammatory process identified on this un-enhanced exam. Spleen: Within normal limits in size. Adrenals/Urinary Tract: Bladder walls appear thickened. Suprapubic catheter in place without obvious complicating feature. Kidneys are unremarkable without stone or hydronephrosis. Stomach/Bowel: Gastrostomy tube in  place. Soft tissue defect of the left lower abdominal wall, presumed colostomy. No large or small bowel dilatation. Vascular/Lymphatic: No pathologically enlarged lymph nodes. No evidence of abdominal aortic aneurysm. IVC filter in place, well-positioned just below the level of the renal veins. Reproductive: No mass or other significant abnormality. Other: Small amount of free fluid within the right paracolic gutter and left lower quadrant. No abscess collection identified within the abdomen or pelvis. No free intraperitoneal air. No obvious fistula between the bladder and left lower abdominal ostomy site. Musculoskeletal: Ill-defined edema throughout the subcutaneous soft tissues suggesting anasarca. Soft tissue defects within the bilateral buttocks region, compatible with the given history of decubitus ulcers. Additional large soft tissue defect overlying the sacrum, again compatible with the given history of decubitus ulcer. Underlying the decubitus ulcer sites, there are fairly well-defined cortical defects within the posterior right iliac bone and posterior sacrum. IMPRESSION: 1. Circumferential thickening of the walls of the bladder suspicious for cystitis. Recommend correlation with urinalysis. Suprapubic catheter in place. 2. Left lower abdominal wall defect, presumed colostomy. No fistulous connection identified between the bladder and this presumed colostomy. If clinical concern for fistula persists, could consider CT cystogram by injecting contrast into the bladder through the indwelling suprapubic catheter. 3. Small amount of ascites. No intra-abdominal or intrapelvic abscess collection seen. No free intraperitoneal air. 4. Decubitus ulcers of the bilateral buttocks regions and overlying the sacrum. 5. Cortical defects along the posterior margins of the right iliac bone and posterior sacrum, somewhat well-defined, immediately underlying the sites of the decubitus ulcers. I suspect that these are chronic  osseous erosions related to the overlying decubitus ulcers. Alternatively, these could be postsurgical changes  performed in conjunction with decubitus ulcer debridements if surgical history correlates. Destructive changes of osteomyelitis is certainly an additional possibility. MRI may be helpful for further characterization. 6. Bibasilar consolidations, atelectasis versus pneumonia, with small adjacent pleural effusions. These results will be called to the ordering clinician or representative by the Radiologist Assistant, and communication documented in the PACS or zVision Dashboard. Electronically Signed   By: Franki Cabot M.D.   On: 12/10/2015 18:50   Dg Chest Port 1 View  12/29/2015  CLINICAL DATA:  Tracheostomy patient, healthcare associated pneumonia old with acute and chronic respiratory failure EXAM: PORTABLE CHEST 1 VIEW COMPARISON:  Portable chest x-ray of December 23, 2015 FINDINGS: The lungs are well-expanded. There has been marked improvement in the appearance of the infiltrate in the right lung. Mild interstitial prominence persists in the mid and lower lung. There is no pleural effusion. The left lung is clear. The heart and pulmonary vascularity are normal. The tracheostomy appliance tip lies at the inferior margin of the clavicular heads which is 5.8 cm above the carina. The right PICC line catheter tip projects over the distal SVC. IMPRESSION: Ongoing improvement of right-sided pneumonia. There is no significant pleural effusion nor evidence of CHF. Electronically Signed   By: David  Martinique M.D.   On: 12/29/2015 07:27   Portable Chest Xray  12/23/2015  CLINICAL DATA:  Pneumonia EXAM: PORTABLE CHEST 1 VIEW COMPARISON:  12/22/2015 FINDINGS: Right PICC and tracheostomy tube are stable. Bilateral airspace disease is stable. There is partial collapse of the right lower lobe which is stable. No pneumothorax. IMPRESSION: Stable bilateral airspace disease. Stable partial collapse of the right lower  lobe. Electronically Signed   By: Marybelle Killings M.D.   On: 12/23/2015 08:14   Dg Chest Port 1 View  12/22/2015  CLINICAL DATA:  Dyspnea.  Quadriplegia. EXAM: PORTABLE CHEST 1 VIEW COMPARISON:  12/20/2015 FINDINGS: Support apparatus stable. Increased right perihilar and basilar airspace opacity. The patient is rotated to the right on today's radiograph, reducing diagnostic sensitivity and specificity. Deformity of the right distal clavicle, stable. Improved aeration at the left lung base. IMPRESSION: 1. Significant worsening of airspace opacity in the right mid lung and right lung base, nonspecific but potentially from pneumonia. However, there is some improvement in the airspace opacity at the left lung base. Electronically Signed   By: Van Clines M.D.   On: 12/22/2015 09:38   Dg Chest Port 1 View  12/20/2015  CLINICAL DATA:  Respiratory failure EXAM: PORTABLE CHEST 1 VIEW COMPARISON:  December 15, 2015 FINDINGS: Tracheostomy catheter tip is 4.8 cm above the carina. Central catheter tip is in the superior vena cava near the cavoatrial junction. There is no appreciable pneumothorax. There is airspace consolidation in the medial right base with small right effusion. Left lung is clear. Heart is upper normal in size with pulmonary vascularity within normal limits. No adenopathy. There is lumbar levoscoliosis. IMPRESSION: Tube and catheter positions as described without pneumothorax. Consolidation medial right base with small right effusion. Left lung clear. No change in cardiac silhouette. Electronically Signed   By: Lowella Grip III M.D.   On: 12/20/2015 07:37   Dg Chest Port 1 View  12/15/2015  CLINICAL DATA:  Followup exam.  Pneumonia. EXAM: PORTABLE CHEST 1 VIEW COMPARISON:  12/13/2015 FINDINGS: Persistent lung base opacity noted consistent with combination of pleural fluid and atelectasis. Pneumonia not excluded. No evidence of pulmonary edema. No pneumothorax. Heart, mediastinum hila are  unremarkable. Tracheostomy tube is stable in well  positioned. IMPRESSION: 1. No change from the previous exam. 2. Persistent lower lung zone opacity consistent with a combination pleural fluid and probable atelectasis. Pneumonia is possible. Electronically Signed   By: Lajean Manes M.D.   On: 12/15/2015 10:10   Dg Chest Port 1 View  12/13/2015  CLINICAL DATA:  Pneumonia. EXAM: PORTABLE CHEST 1 VIEW COMPARISON:  December 09, 2015. FINDINGS: Stable cardiomediastinal silhouette. No pneumothorax is noted. Mildly increased bilateral pleural effusions are noted with probable associated atelectasis. Tracheostomy tube is unchanged in position. Bony thorax is unremarkable. IMPRESSION: Mildly increased bibasilar opacities are noted most consistent with subsegmental atelectasis and associated pleural effusions. Electronically Signed   By: Marijo Conception, M.D.   On: 12/13/2015 10:16   Dg Chest Port 1 View  12/09/2015  CLINICAL DATA:  Pneumonia. EXAM: PORTABLE CHEST 1 VIEW COMPARISON:  12/08/2015. FINDINGS: Tracheostomy to in stable position. Mediastinum hilar structures normal. Low lung volumes with persistent bibasilar atelectasis and/or infiltrates again noted. Small pleural effusions again noted. No pneumothorax. IMPRESSION: 1. Tracheostomy tube in stable position. 2. Low lung volumes with persistent bibasilar atelectasis and/or infiltrates. Persistent bilateral pleural effusions. Similar findings noted on prior exam. Electronically Signed   By: [place]   On: 12/09/2015 07:20   Dg Chest Port 1 View  12/08/2015  CLINICAL DATA:  Acute respiratory failure. EXAM: PORTABLE CHEST 1 VIEW COMPARISON:  12/07/2015. FINDINGS: Tracheostomy tube in stable position. Heart size stable. Persistent bibasilar atelectasis and/or infiltrates. Small left pleural effusion. No pneumothorax. Right costophrenic angle not imaged. IMPRESSION: 1. Tracheostomy tube in stable position. 2. Persistent bibasilar atelectasis and/or infiltrates  without interim clearing. Small left pleural effusion again noted. Electronically Signed   By: Marcello Moores  Register   On: 12/08/2015 07:09   Dg Chest Port 1 View  12/07/2015  CLINICAL DATA:  Acute onset of respiratory failure. Initial encounter. EXAM: PORTABLE CHEST 1 VIEW COMPARISON:  Chest radiograph performed 12/06/2015 FINDINGS: The patient's tracheostomy tube is seen ending 5 cm above the carina. Bibasilar airspace opacities may reflect pneumonia or pulmonary edema. Small bilateral pleural effusions are noted. No pneumothorax is seen. The cardiomediastinal silhouette remains normal in size. No acute osseous abnormalities are identified. IMPRESSION: Bibasilar airspace opacities may reflect pneumonia or pulmonary edema. Small bilateral pleural effusions noted. Electronically Signed   By: Garald Balding M.D.   On: 12/07/2015 06:43   Dg Chest Portable 1 View  12/06/2015  CLINICAL DATA:  30 year old male with sepsis and decreased O2 sats. 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 There are bibasilar opacities with silhouetting of the diaphragm, concerning for pneumonia. There has been interval increase in the left lung base density compared to the prior study. Small bilateral pleural effusions may be present. The cardiac silhouette is within normal limits. No acute osseous pathology. IMPRESSION: Bibasilar airspace densities concerning for pneumonia. Clinical correlation and follow-up recommended. Electronically Signed   By: Anner Crete M.D.   On: 12/06/2015 19:59   Dg Abd Portable 1v  12/18/2015  CLINICAL DATA:  Acute onset of abdominal distention. Initial encounter. EXAM: PORTABLE ABDOMEN - 1 VIEW COMPARISON:  CT of the abdomen and pelvis from 12/10/2015 FINDINGS: The visualized bowel gas pattern is unremarkable. Scattered air and stool filled loops of colon are seen; no abnormal dilatation of small bowel  loops is seen to suggest small bowel obstruction. No free intra-abdominal air is identified, though evaluation for free air is limited on a  single supine view. The stomach is partially filled with air. An IVC filter is noted. The visualized osseous structures are within normal limits; the sacroiliac joints are unremarkable in appearance. The visualized lung bases are essentially clear. IMPRESSION: Unremarkable bowel gas pattern; no free intra-abdominal air seen. Moderate amount of stool noted in the colon. Stomach partially filled with air. Electronically Signed   By: Garald Balding M.D.   On: 12/18/2015 21:10    Oren Binet, MD  [facility] Pager:[phone]  If 7PM-7AM, please contact night-coverage [email or web address] Password [email or web address] 01/04/2016, 1:41 PM   LOS: 29 days

## 2016-01-04 NOTE — Consult Note (Addendum)
WOC wound follow up: Consult performed today for Vac re-application. Pt was using Vac prior to admission but it was removed when he began to have mod amt yellow drainage from rectum and around Flexiseal, Vac seal was unable to be maintained. Pt and mother are disappointed since they had hoped to discharge home today, but those plans have been cancelled. 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 Ostomy pouch intact with good seal, mod amt brown semiformed stool. Pouching supplies left at bedside for staff nurse use.  Applied Vac dressing to left and right iscium and sacrum wounds; one piece black foam to each wound, bridged together, then bridged to hip to reduce pressure from the track pad.Applied barrier rings around wounds to attempt to maintain seal and avoid leakage from rectum. Pt tolerated without pain. Attached Y connector to one machine with cont suction on at 129mm. Mother states she is very familiar with Vac application and changing process plans to have home machine applied on FRI with next dressing change if patient will discharge at that time. This consult took one hour to perform. Julien Girt MSN, RN, [place], [place], [place]

## 2016-01-05 DIAGNOSIS — I9589 Other hypotension: Secondary | ICD-10-CM

## 2016-01-05 DIAGNOSIS — F411 Generalized anxiety disorder: Secondary | ICD-10-CM

## 2016-01-05 LAB — GLUCOSE, CAPILLARY
GLUCOSE-CAPILLARY: 147 mg/dL — AB (ref 65–99)
Glucose-Capillary: 116 mg/dL — ABNORMAL HIGH (ref 65–99)
Glucose-Capillary: 122 mg/dL — ABNORMAL HIGH (ref 65–99)
Glucose-Capillary: 140 mg/dL — ABNORMAL HIGH (ref 65–99)

## 2016-01-05 LAB — BASIC METABOLIC PANEL
Anion gap: 7 (ref 5–15)
BUN: 32 mg/dL — AB (ref 6–20)
CALCIUM: 7 mg/dL — AB (ref 8.9–10.3)
CHLORIDE: 110 mmol/L (ref 101–111)
CO2: 20 mmol/L — AB (ref 22–32)
CREATININE: 0.6 mg/dL — AB (ref 0.61–1.24)
GFR calc non Af Amer: >60 mL/min (ref >60)
GLUCOSE: 156 mg/dL — AB (ref 65–99)
Potassium: 3.4 mmol/L — ABNORMAL LOW (ref 3.5–5.1)
Sodium: 137 mmol/L (ref 135–145)

## 2016-01-05 LAB — CBC
HEMATOCRIT: 25.9 % — AB (ref 39.0–52.0)
Hemoglobin: 8 g/dL — ABNORMAL LOW (ref 13.0–17.0)
MCH: 24.7 pg — AB (ref 26.0–34.0)
MCHC: 30.9 g/dL (ref 30.0–36.0)
MCV: 79.9 fL (ref 78.0–100.0)
Platelets: 941 10*3/uL (ref 150–400)
RBC: 3.24 MIL/uL — AB (ref 4.22–5.81)
RDW: 17.6 % — ABNORMAL HIGH (ref 11.5–15.5)
WBC: 23.3 10*3/uL — ABNORMAL HIGH (ref 4.0–10.5)

## 2016-01-05 MED ORDER — FENTANYL 50 MCG/HR TD PT72: 50.0000 ug | MEDICATED_PATCH | TRANSDERMAL | Status: DC

## 2016-01-05 MED ORDER — SODIUM CHLORIDE 0.9 % IV SOLN
1.0000 g | Freq: Once | INTRAVENOUS | Status: AC
  Administered 2016-01-05: 1 g via INTRAVENOUS
  Filled 2016-01-05: qty 10

## 2016-01-05 MED ORDER — OXYCODONE HCL 20 MG PO TABS: 20.0000 mg | ORAL_TABLET | Freq: Four times a day (QID) | ORAL | Status: DC | PRN

## 2016-01-05 MED ORDER — HYDROCORTISONE 20 MG PO TABS: 20.0000 mg | ORAL_TABLET | Freq: Every morning | ORAL | Status: DC

## 2016-01-05 MED ORDER — ALBUTEROL SULFATE (2.5 MG/3ML) 0.083% IN NEBU: 2.5000 mg | INHALATION_SOLUTION | RESPIRATORY_TRACT | Status: DC | PRN

## 2016-01-05 MED ORDER — ALBUTEROL SULFATE (2.5 MG/3ML) 0.083% IN NEBU: 2.5000 mg | INHALATION_SOLUTION | Freq: Four times a day (QID) | RESPIRATORY_TRACT | Status: DC

## 2016-01-05 MED ORDER — HYDROCORTISONE 10 MG PO TABS: 10.0000 mg | ORAL_TABLET | Freq: Every evening | ORAL | Status: DC

## 2016-01-05 NOTE — Progress Notes (Signed)
Have faxed dc summary to adult and pediatric for oxygen . They are del to home today. Pt was dc w o2 per ambulance. [facility] tucker had orders and dc summary to resume home nsg and knew pt was leaving around 2pm. Mother rode home w pt in ambulance.

## 2016-01-05 NOTE — Telephone Encounter (Signed)
done

## 2016-01-05 NOTE — Discharge Summary (Addendum)
PATIENT DETAILS Name: Jason [name] Age: 30 y.o. Sex: male Date of Birth: 1986/09/13 MRN: [id number]. Admitting Physician: Marshell Garfinkel, MD PCP:FRY,STEPHEN A, MD  Admit Date: 12/06/2015 Discharge date: 01/05/2016  Recommendations for Outpatient Follow-up:  1. Taper off hydrocortisone very slowly. 2. If hyponatremia/hyperkalemia recurs-may need formal ACTH stimulation test and endocrinology evaluation 3. Please check CBC and chemistries in 1 week. 4. Home health services have been resumed   PRIMARY DISCHARGE DIAGNOSIS:  Principal Problem:   HCAP (healthcare-associated pneumonia) Active Problems:   Neurogenic bladder   Uncontrolled type 1 diabetes mellitus with hyperglycemia ([name])   Acute on chronic respiratory failure ([name])   Recurrent UTI   Respiratory failure, chronic ([name])   Paraplegia ([name])   Pressure ulcer   Acute respiratory failure ([name])   Pneumonia   Fistula   Pyrexia   Healthcare-associated pneumonia   Abdominal distension   Fever   PNA (pneumonia)   Arterial hypotension   Palliative care encounter   VAP (ventilator-associated pneumonia) ([place])   Pneumonia of both lower lobes due to Pseudomonas species ([name])   Acute on chronic respiratory failure with hypoxia ([name])   Seizures ([name])   Anxiety state   Chronic hypotension   Acute renal failure ([name])   Urinary tract infection, site not specified   Diabetes mellitus type 1 with complications ([name])   Fibromyalgia   Tracheostomy care ([place])   Ventilator dependence ([place])      PAST MEDICAL HISTORY: Past Medical History  Diagnosis Date  . GERD (gastroesophageal reflux disease)   . Asthma   . Hx MRSA infection     on face  . Gastroparesis   . Diabetic neuropathy ([place])   . Seizures ([place])   . Family history of anesthesia complication     Pt mother can't have epidural procedures  . Dysrhythmia   . Pneumonia   . Arthritis   . Fibromyalgia   . Stroke ([place]) 01/29/2014    spinal stroke ; "quadriplegic  since"  . Type I diabetes mellitus ([place])     sees Dr. Loanne Drilling   . Syncope 02/16/2015  . Multiple allergies 11/02/2015  . Recurrent UTI 11/02/2015    DISCHARGE MEDICATIONS: Current Discharge Medication List    START taking these medications   Details  !! hydrocortisone (CORTEF) 10 MG tablet Take 1 tablet (10 mg total) by mouth every evening. Qty: 30 tablet, Refills: 0    !! hydrocortisone (CORTEF) 20 MG tablet Take 1 tablet (20 mg total) by mouth every morning. Qty: 30 tablet, Refills: 0     !! - Potential duplicate medications found. Please discuss with provider.    CONTINUE these medications which have CHANGED   Details  !! albuterol (PROVENTIL) (2.5 MG/3ML) 0.083% nebulizer solution Take 3 mLs (2.5 mg total) by nebulization every 2 (two) hours as needed for wheezing or shortness of breath. Qty: 75 mL, Refills: 0    !! albuterol (PROVENTIL) (2.5 MG/3ML) 0.083% nebulizer solution Take 3 mLs (2.5 mg total) by nebulization 4 (four) times daily. Qty: 75 mL, Refills: 0     !! - Potential duplicate medications found. Please discuss with provider.    CONTINUE these medications which have NOT CHANGED   Details  acetaminophen (TYLENOL) 325 MG tablet Take 2 tablets (650 mg total) by mouth every 6 (six) hours as needed for mild pain, moderate pain, fever or headache.    baclofen (LIORESAL) 20 MG tablet Take 1 tablet (20 mg total) by mouth 4 (four) times daily. Qty: 120  tablet, Refills: 4   Associated Diagnoses: Sacral decubitus ulcer, unspecified pressure ulcer stage; Osteomyelitis, pelvic region and thigh ([name])    BISACODYL 5 MG EC tablet Take ONE (1) tablet by mouth twice daily as needed FOR MODERATE CONSTIPATION Qty: 60 tablet, Refills: 1    buPROPion (WELLBUTRIN XL) 150 MG 24 hr tablet Take 1 tablet (150 mg total) by mouth daily. Qty: 30 tablet, Refills: 5    camphor-menthol (SARNA) lotion Apply topically as needed for itching. Qty: 222 mL, Refills: 0    collagenase (SANTYL)  ointment Apply 1 application topically every Monday, Wednesday, and Friday. Apply to necrotic tissue in the sacral wound only. Qty: 90 g, Refills: 5    diclofenac sodium (VOLTAREN) 1 % GEL Apply 2 g topically 2 (two) times daily. Qty: 100 g, Refills: 11    docusate (COLACE) 50 MG/5ML liquid Take 10 mLs (100 mg total) by mouth 2 (two) times daily. Qty: 473 mL, Refills: 11    ferrous sulfate 325 (65 FE) MG tablet Take 1 tablet (325 mg total) by mouth daily with breakfast. Qty: 30 tablet, Refills: 11    glucagon (GLUCAGON EMERGENCY) 1 MG injection INJECT INTO MUSCLE ONCE AS NEEDED Qty: 1 kit, Refills: 0    GLUCERNA (GLUCERNA) LIQD 1,000 mLs by PEG Tube route continuous. 70 ml/hr    insulin aspart (NOVOLOG FLEXPEN) 100 UNIT/ML FlexPen Inject 0-10 Units into the skin 4 (four) times daily as needed for high blood sugar (CBG >200). Per sliding scale: CBG <200 0 units, 201-300 4 units, 301-400 6 units, >400 10 units    insulin glargine (LANTUS) 100 UNIT/ML injection Inject 0.15 mLs (15 Units total) into the skin at bedtime. Qty: 10 mL, Refills: 11    loratadine (CLARITIN) 10 MG tablet Take 1 tablet (10 mg total) by mouth daily. Qty: 30 tablet, Refills: 11    LORazepam (ATIVAN) 0.5 MG tablet Take 0.5 mg by mouth 3 (three) times daily as needed for anxiety. Reported on 11/02/2015    Magnesium Oxide 400 MG CAPS Take 1 capsule (400 mg total) by mouth 2 (two) times daily. Qty: 60 capsule, Refills: 11    midodrine (PROAMATINE) 10 MG tablet Take 2 tablets (20 mg total) by mouth 3 (three) times daily with meals. Qty: 180 tablet, Refills: 6    Multiple Vitamin (MULTIVITAMIN WITH MINERALS) TABS tablet Place 1 tablet into feeding tube daily.    ondansetron (ZOFRAN ODT) 8 MG disintegrating tablet Place 1 tablet (8 mg total) into feeding tube every 8 (eight) hours as needed for nausea or vomiting. Qty: 60 tablet, Refills: 5    pantoprazole sodium (PROTONIX) 40 mg/20 mL PACK Place 20 mLs (40 mg total)  into feeding tube daily. Qty: 30 each, Refills: 6    pregabalin (LYRICA) 100 MG capsule Place 1 capsule (100 mg total) into feeding tube 3 (three) times daily. Take 100 mg by mouth in the morning, take 100 mg by mouth in the afternoon, take 100 mg by mouth in the evening and take 200 mg by mouth at bedtime. Qty: 150 capsule, Refills: 5   Associated Diagnoses: Spinal cord infarction ([place]); Type 1 diabetes, uncontrolled, with neuropathy ([place]); Sacral pressure sore, unspecified pressure ulcer stage; Tetraplegia ([name])    saccharomyces boulardii (FLORASTOR) 250 MG capsule Take 1 capsule (250 mg total) by mouth 2 (two) times daily. Qty: 60 capsule, Refills: 3    Water For Irrigation, Sterile (FREE WATER) SOLN Place 200 mLs into feeding tube every 8 (eight) hours. Qty: 10000  mL, Refills: 5    fentaNYL (DURAGESIC - DOSED MCG/HR) 50 MCG/HR Place 1 patch (50 mcg total) onto the skin every 3 (three) days. Qty: 10 patch, Refills: 0   Associated Diagnoses: Sacral decubitus ulcer, unspecified pressure ulcer stage; Osteomyelitis, pelvic region and thigh ([name])    glucose blood (ACCU-CHEK SMARTVIEW) test strip Use to check blood sugar 4 times per day. Dx Code: E11.9. Appointment needed for further refills. Qty: 200 each, Refills: 0    mupirocin ointment (BACTROBAN) 2 % Apply to affected area twice a day as needed Qty: 22 g, Refills: 11    Oxycodone HCl 20 MG TABS Take 1 tablet (20 mg total) by mouth every 6 (six) hours as needed (pain). Qty: 120 tablet, Refills: 0    SURE COMFORT INS SYR 1CC/29G 29G X 1/2" 1 ML MISC Use As Directed Qty: 100 each, Refills: 1      STOP taking these medications     ciprofloxacin (CIPRO) 500 MG tablet      doxycycline (VIBRA-TABS) 100 MG tablet      fosfomycin (MONUROL) 3 G PACK         ALLERGIES:   Allergies  Allergen Reactions  . Cefuroxime Axetil Anaphylaxis    Tolerated cefepime 12/2015 - with Pepcid/Solu-Medrol  . Ertapenem Other (See Comments)    Rash  and confusion-->tolerated Imipenem   . Morphine And Related Other (See Comments)    Changed mental status, confusion, headache, visual hallucination  . Penicillins Anaphylaxis and Other (See Comments)    Tolerated Imipenem; no reaction to 7 day course of amoxicillin in 2015 Has patient had a PCN reaction causing immediate rash, facial/tongue/throat swelling, SOB or lightheadedness with hypotension: Yes Has patient had a PCN reaction causing severe rash involving mucus membranes or skin necrosis: No Has patient had a PCN reaction that required hospitalization Yes Has patient had a PCN reaction occurring within the last 10 years: No If all of the above answers are "NO", then may proceed with Cephalo  . Sulfa Antibiotics Anaphylaxis, Shortness Of Breath and Other (See Comments)  . Tessalon [Benzonatate] Anaphylaxis  . Shellfish Allergy Itching and Other (See Comments)    Took benadryl to alleviate reaction  . Levaquin [Levofloxacin In D5w] Swelling    Hand and forearm swelling  . Nsaids Itching and Other (See Comments)    Risk of bleeding, itching  . 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 Rule out fistula from bladder to ostomy. Severe case of MRSA. Decubitus ulcers to back and buttocks. Oral contrast only due to inadequate IV access. EXAM: CT ABDOMEN AND PELVIS WITHOUT CONTRAST TECHNIQUE: Multidetector CT imaging of the  abdomen and pelvis was performed following the standard protocol without IV contrast. COMPARISON:  None. FINDINGS: Lower chest: There are bibasilar consolidations which could represent atelectasis or pneumonia. There are also small bilateral pleural effusions. Hepatobiliary: No mass visualized on this un-enhanced exam. Pancreas: No mass or inflammatory process identified on this un-enhanced exam. Spleen: Within  normal limits in size. Adrenals/Urinary Tract: Bladder walls appear thickened. Suprapubic catheter in place without obvious complicating feature. Kidneys are unremarkable without stone or hydronephrosis. Stomach/Bowel: Gastrostomy tube in place. Soft tissue defect of the left lower abdominal wall, presumed colostomy. No large or small bowel dilatation. Vascular/Lymphatic: No pathologically enlarged lymph nodes. No evidence of abdominal aortic aneurysm. IVC filter in place, well-positioned just below the level of the renal veins. Reproductive: No mass or other significant abnormality. Other: Small amount of free fluid within the right paracolic gutter and left lower quadrant. No abscess collection identified within the abdomen or pelvis. No free intraperitoneal air. No obvious fistula between the bladder and left lower abdominal ostomy site. Musculoskeletal: Ill-defined edema throughout the subcutaneous soft tissues suggesting anasarca. Soft tissue defects within the bilateral buttocks region, compatible with the given history of decubitus ulcers. Additional large soft tissue defect overlying the sacrum, again compatible with the given history of decubitus ulcer. Underlying the decubitus ulcer sites, there are fairly well-defined cortical defects within the posterior right iliac bone and posterior sacrum. IMPRESSION: 1. Circumferential thickening of the walls of the bladder suspicious for cystitis. Recommend correlation with urinalysis. Suprapubic catheter in place. 2. Left lower abdominal wall defect,  presumed colostomy. No fistulous connection identified between the bladder and this presumed colostomy. If clinical concern for fistula persists, could consider CT cystogram by injecting contrast into the bladder through the indwelling suprapubic catheter. 3. Small amount of ascites. No intra-abdominal or intrapelvic abscess collection seen. No free intraperitoneal air. 4. Decubitus ulcers of the bilateral buttocks regions and overlying the sacrum. 5. Cortical defects along the posterior margins of the right iliac bone and posterior sacrum, somewhat well-defined, immediately underlying the sites of the decubitus ulcers. I suspect that these are chronic osseous erosions related to the overlying decubitus ulcers. Alternatively, these could be postsurgical changes performed in conjunction with decubitus ulcer debridements if surgical history correlates. Destructive changes of osteomyelitis is certainly an additional possibility. MRI may be helpful for further characterization. 6. Bibasilar consolidations, atelectasis versus pneumonia, with small adjacent pleural effusions. These results will be called to the ordering clinician or representative by the Radiologist Assistant, and communication documented in the PACS or zVision Dashboard. Electronically Signed   By: Franki Cabot M.D.   On: 12/10/2015 18:50   Dg Chest Port 1 View  12/29/2015  CLINICAL DATA:  Tracheostomy patient, healthcare associated pneumonia old with acute and chronic respiratory failure EXAM: PORTABLE CHEST 1 VIEW COMPARISON:  Portable chest x-ray of December 23, 2015 FINDINGS: The lungs are well-expanded. There has been marked improvement in the appearance of the infiltrate in the right lung. Mild interstitial prominence persists in the mid and lower lung. There is no pleural effusion. The left lung is clear. The heart and pulmonary vascularity are normal. The tracheostomy appliance tip lies at the inferior margin of the clavicular heads which is 5.8  cm above the carina. The right PICC line catheter tip projects over the distal SVC. IMPRESSION: Ongoing improvement of right-sided pneumonia. There is no significant pleural effusion nor evidence of CHF. Electronically Signed   By: David  Martinique M.D.   On: 12/29/2015 07:27   Portable Chest Xray  12/23/2015  CLINICAL DATA:  Pneumonia EXAM: PORTABLE CHEST 1 VIEW COMPARISON:  12/22/2015 FINDINGS: Right PICC and tracheostomy tube are stable. Bilateral airspace disease is stable. There is partial collapse of the right lower lobe which is stable. No pneumothorax. IMPRESSION: Stable bilateral airspace disease. Stable partial collapse of the right lower  lobe. Electronically Signed   By: Marybelle Killings M.D.   On: 12/23/2015 08:14   Dg Chest Port 1 View  12/22/2015  CLINICAL DATA:  Dyspnea.  Quadriplegia. EXAM: PORTABLE CHEST 1 VIEW COMPARISON:  12/20/2015 FINDINGS: Support apparatus stable. Increased right perihilar and basilar airspace opacity. The patient is rotated to the right on today's radiograph, reducing diagnostic sensitivity and specificity. Deformity of the right distal clavicle, stable. Improved aeration at the left lung base. IMPRESSION: 1. Significant worsening of airspace opacity in the right mid lung and right lung base, nonspecific but potentially from pneumonia. However, there is some improvement in the airspace opacity at the left lung base. Electronically Signed   By: Van Clines M.D.   On: 12/22/2015 09:38   Dg Chest Port 1 View  12/20/2015  CLINICAL DATA:  Respiratory failure EXAM: PORTABLE CHEST 1 VIEW COMPARISON:  December 15, 2015 FINDINGS: Tracheostomy catheter tip is 4.8 cm above the carina. Central catheter tip is in the superior vena cava near the cavoatrial junction. There is no appreciable pneumothorax. There is airspace consolidation in the medial right base with small right effusion. Left lung is clear. Heart is upper normal in size with pulmonary vascularity within normal limits.  No adenopathy. There is lumbar levoscoliosis. IMPRESSION: Tube and catheter positions as described without pneumothorax. Consolidation medial right base with small right effusion. Left lung clear. No change in cardiac silhouette. Electronically Signed   By: Lowella Grip III M.D.   On: 12/20/2015 07:37   Dg Chest Port 1 View  12/15/2015  CLINICAL DATA:  Followup exam.  Pneumonia. EXAM: PORTABLE CHEST 1 VIEW COMPARISON:  12/13/2015 FINDINGS: Persistent lung base opacity noted consistent with combination of pleural fluid and atelectasis. Pneumonia not excluded. No evidence of pulmonary edema. No pneumothorax. Heart, mediastinum hila are unremarkable. Tracheostomy tube is stable in well positioned. IMPRESSION: 1. No change from the previous exam. 2. Persistent lower lung zone opacity consistent with a combination pleural fluid and probable atelectasis. Pneumonia is possible. Electronically Signed   By: Lajean Manes M.D.   On: 12/15/2015 10:10   Dg Chest Port 1 View  12/13/2015  CLINICAL DATA:  Pneumonia. EXAM: PORTABLE CHEST 1 VIEW COMPARISON:  December 09, 2015. FINDINGS: Stable cardiomediastinal silhouette. No pneumothorax is noted. Mildly increased bilateral pleural effusions are noted with probable associated atelectasis. Tracheostomy tube is unchanged in position. Bony thorax is unremarkable. IMPRESSION: Mildly increased bibasilar opacities are noted most consistent with subsegmental atelectasis and associated pleural effusions. Electronically Signed   By: Marijo Conception, M.D.   On: 12/13/2015 10:16   Dg Chest Port 1 View  12/09/2015  CLINICAL DATA:  Pneumonia. EXAM: PORTABLE CHEST 1 VIEW COMPARISON:  12/08/2015. FINDINGS: Tracheostomy to in stable position. Mediastinum hilar structures normal. Low lung volumes with persistent bibasilar atelectasis and/or infiltrates again noted. Small pleural effusions again noted. No pneumothorax. IMPRESSION: 1. Tracheostomy tube in stable position. 2. Low lung volumes  with persistent bibasilar atelectasis and/or infiltrates. Persistent bilateral pleural effusions. Similar findings noted on prior exam. Electronically Signed   By: [place]   On: 12/09/2015 07:20   Dg Chest Port 1 View  12/08/2015  CLINICAL DATA:  Acute respiratory failure. EXAM: PORTABLE CHEST 1 VIEW COMPARISON:  12/07/2015. FINDINGS: Tracheostomy tube in stable position. Heart size stable. Persistent bibasilar atelectasis and/or infiltrates. Small left pleural effusion. No pneumothorax. Right costophrenic angle not imaged. IMPRESSION: 1. Tracheostomy tube in stable position. 2. Persistent bibasilar atelectasis and/or infiltrates without interim clearing. Small left  pleural effusion again noted. Electronically Signed   By: Marcello Moores  Register   On: 12/08/2015 07:09   Dg Chest Port 1 View  12/07/2015  CLINICAL DATA:  Acute onset of respiratory failure. Initial encounter. EXAM: PORTABLE CHEST 1 VIEW COMPARISON:  Chest radiograph performed 12/06/2015 FINDINGS: The patient's tracheostomy tube is seen ending 5 cm above the carina. Bibasilar airspace opacities may reflect pneumonia or pulmonary edema. Small bilateral pleural effusions are noted. No pneumothorax is seen. The cardiomediastinal silhouette remains normal in size. No acute osseous abnormalities are identified. IMPRESSION: Bibasilar airspace opacities may reflect pneumonia or pulmonary edema. Small bilateral pleural effusions noted. Electronically Signed   By: Garald Balding M.D.   On: 12/07/2015 06:43   Dg Chest Portable 1 View  12/06/2015  CLINICAL DATA:  30 year old male with sepsis and decreased O2 sats. 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 There are bibasilar opacities with silhouetting of the diaphragm, concerning for pneumonia. There has been interval increase in the left lung base density compared to the prior study. Small bilateral  pleural effusions may be present. The cardiac silhouette is within normal limits. No acute osseous pathology. IMPRESSION: Bibasilar airspace densities concerning for pneumonia. Clinical correlation and follow-up recommended. Electronically Signed   By: Anner Crete M.D.   On: 12/06/2015 19:59   Dg Abd Portable 1v  12/18/2015  CLINICAL DATA:  Acute onset of abdominal distention. Initial encounter. EXAM: PORTABLE ABDOMEN - 1 VIEW COMPARISON:  CT of the abdomen and pelvis from 12/10/2015 FINDINGS: The visualized bowel gas pattern is unremarkable. Scattered air and stool filled loops of colon are seen; no abnormal dilatation of small bowel loops is seen to suggest small bowel obstruction. No free intra-abdominal air is identified, though evaluation for free air is limited on a single supine view. The stomach is partially filled with air. An IVC filter is noted. The visualized osseous structures are within normal limits; the sacroiliac joints are unremarkable in appearance. The visualized lung bases are essentially clear. IMPRESSION: Unremarkable bowel gas pattern; no free intra-abdominal air seen. Moderate amount of stool noted in the colon. Stomach partially filled with air. Electronically Signed   By: Garald Balding M.D.   On: 12/18/2015 21:10     PERTINENT LAB RESULTS: CBC:  Recent Labs  01/03/16 0445 01/04/16 0500  WBC 25.0* 23.3*  HGB 7.8* 8.0*  HCT 25.6* 25.9*  PLT 913* 941*   CMET CMP     Component Value Date/Time   NA 137 01/05/2016 0510   K 3.4* 01/05/2016 0510   CL 110 01/05/2016 0510   CO2 20* 01/05/2016 0510   GLUCOSE 156* 01/05/2016 0510   BUN 32* 01/05/2016 0510   CREATININE 0.60* 01/05/2016 0510   CREATININE 0.78 11/02/2015 1207   CALCIUM 7.0* 01/05/2016 0510   PROT 7.4 12/30/2015 0445   ALBUMIN 2.4* 12/30/2015 0445   AST 18 12/30/2015 0445   ALT 18 12/30/2015 0445   ALKPHOS 98 12/30/2015 0445   BILITOT 0.5 12/30/2015 0445   GFRNONAA >60 01/05/2016 0510    GFRNONAA >89 11/02/2015 1207   GFRAA >60 01/05/2016 0510   GFRAA >89 11/02/2015 1207    GFR Estimated Creatinine Clearance: 101.6 mL/min (by C-G formula based on Cr of 0.6). No results for input(s): LIPASE, AMYLASE in the last 72 hours.  Recent Labs  01/04/16 0500  CKTOTAL 9*   Invalid input(s): POCBNP No results for input(s): DDIMER in the last 72  hours. No results for input(s): HGBA1C in the last 72 hours. No results for input(s): CHOL, HDL, LDLCALC, TRIG, CHOLHDL, LDLDIRECT in the last 72 hours. No results for input(s): TSH, T4TOTAL, T3FREE, THYROIDAB in the last 72 hours.  Invalid input(s): FREET3 No results for input(s): VITAMINB12, FOLATE, FERRITIN, TIBC, IRON, RETICCTPCT in the last 72 hours. Coags: No results for input(s): INR in the last 72 hours.  Invalid input(s): PT Microbiology: No results found for this or any previous visit (from the past 240 hour(s)).   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 Initially admitted to intensive care unit by PCCM, required ventilator support. Once improved, ventilator was slowly weaned off. Infectious disease directed IV antibiotics- given history of multidrug resistant organisms and multiple allergies. By day of discharge, afebrile and leukocytosis has improved, and patient was weaned off the ventilator. See below for further details.   Significant Events: 2/28 admit to [facility] by PCCM 3/2 no fevers, low secretions, vented, refusing Chest PT and other treatments 3/3 improved to TC, pcxr better 3/5 trach collar 24 Hrs 3/6 Tmax 103.2 3/8 still spiking temps  3/9 Colistin started  3/13 encephalopathic, rising creatinine,  hyperkalemic 3/14 remains encephalopathic - 1/2 blood cx's with GPCs 3/16 unresponsive - resp pattern worse, agonal - added inhaled amikacin, placed back on vent 3/21-transferred to [facility] 3/25 Weaned off Vent 3/28 Completed IV Cefepime (premedicated with IV Solumedrol) 3/29 Worsening Hyponatremia and Hyperkalemia-Cortisol 1.3 (suppressed as recently on IV Solu-Medrol)-started hydrocortisone 3/30 Discharge home  Hospital course by problem list: Acute on chronic hypoxic respiratory failure/tracheostomy dependent: Secondary to multidrug resistant Pseudomonas pneumonia and ?MRSA. PCCM admitted the patient to the intensive care unit, and subsequently transferred care to [facility]. However [facility] and continue to follow patient, patient was successfully weaned off the ventilator and he has now been off the ventilator for the past for 5 days. We will be on home O2 from now on. He already has a ventilator at home, and after discussion with critical care and family, patient will be provided with some ventilator support at night at home. Currently significantly improved then the past few days, and has met maximum benefit from his hospitalization, and is stable for discharge. Home health services has been set up by case management. Critical care will follow him at the tracheostomy clinic in the next one week or so.   Sepsis secondary to VAP/ MDR Pseudeomonas and MRSA MVE:HMCNO resolved, leukocytosis has started to downtrend. Given multiple allergies, drug resistant bacteria-limited options. Completed a course  cefepime (premedication with steroids and Pepcid) on 3/28. Vancomycin discontinued on 3/27. Sepsis pathophysiology has resolved.no antibiotics required on discharge. Infectious disease followed patient closely during this hospital stay.   Acute renal failure: Likely secondary to prerenal azotemia-setting of sepsis. Resolved with supportive care. Follow periodically  Hyperkalemia: resolved with  Kayexalate.Given cortisol levels was significantly suppressed-he has been started on hydrocortisone, please continue to follow and give her hydrocortisone slowly. Follow electrolytes periodically.   Acute encephalopathy: Likely toxic met encephalopathy from sepsis. encephalopathy has completely resolved, patient is now  is awake and alert.EEG 03/13-without any seizure-like activity, but moderate diffuse slowing.  Yeast UTI: Completed a course of fluconazole-per infectious disease   ? Seizure: Thought to be secondary to fever, and potential drug  reaction from inhaled amikacin. No further seizures since then.EEG 03/13-without any seizure. Monitor and follow closely. No role for antiepileptics at this time  Chronic hypotension: Continue Midodrine-BP stable for now  Anemia: Likely secondary to acute illness on top of chronic disease. Hemoglobin stable, follow trend closely  Thrombocytosis: Likely reflecting chronic inflammation-acute phase reactant. No indication of Fe deficiency on Fe panel. Spoke with Dr Lindi Adie over the phone-since reactive-no further intervention/treatment required.  Hyponatremia: Suspected this was multifactorial-probably secondary to discontinuation of steroids-was on IV Solu-Medrol along with cefepime,  free water via PEG tube- rapidly improved after given low-dose hydrocortisone. Would slowly taper off hydrocortisone and if hyponatremia reoccurs, formal ACTH stimulation test may be needed.   History of gastroparesis: Likely secondary to diabetes, no vomiting-seem stable at present. Follow closely   Dysphagia: Kept NPO while on a ventilator-subsequently seen by speech therapy and started on regular diet. Continue PEG feeds-patient is on for PEG feeds for 12 hours  Severe protein calorie malnutrition: continue peg tube feeds-see above  Stage IV sacral ulcers/stage III left heel ulcer/Full thickness wound mid back:resent prior to admission, followed closely by wound care RN in  this hospital stay-on discharge home with services including RN has been arranged.  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  Chronic pain syndrome: Secondary to extensive sacral ulcers-continue transdermal fentanyl/oxycodone.   Anxiety: Continue Ativan as needed.  History of neurogenic bladder: Supra pubic catheter in place.  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. IVC filter placed 8/6. Review of prior documentation-not on A/C due to need for frequent debridements which have caused extensive bleeding, previously on apixaban- was on prophylactic Heparin during this hospital stay  History of spinal cord infarct with paraplegia  Palliative care: Unfortunate 30 year old patient with prior spinal cord injury with resultant paraplegia-chronic tracheostomy-multiple infections in the past with multidrug resistant organisms-multiple allergies to numerous antibiotics-brought into the hospital for worsening hypoxic respiratory failure secondary to ventilator associated pneumonia from multidrug resistant Pseudomonas. Had a prolonged hospital stay-was weaned off the ventilator very slowly given that he was severely deconditioned.  Hospital course complicated by worsening sepsis and renal failure in spite of broad spectrum antibiotics. Family has had multiple discussions with critical care MDs, palliative care M.D.-currently he remains a full code. although he has stabilized and is much improved by the day of discharge, he remains with very poor overall long term prognoses. He remains at risk for further hospitalizations and decompensation-and continuous to be at high risk for development of life-threatening infections due to multi-drug-resistant organisms. Family and patient is aware of this difficult situation.   TODAY-DAY OF DISCHARGE:  Subjective:   [facility] today has no headache,no chest abdominal  pain,no new weakness tingling or numbness  Objective:   Blood pressure 119/77, pulse 94, temperature 98.3 F (36.8 C), temperature source Oral, resp. rate 16, height 5' 8" (1.727 m), weight 53.661 kg (118 lb 4.8 oz), SpO2 98 %.  Intake/Output Summary (Last 24 hours) at 01/05/16 1403 Last data filed at 01/05/16 1349  Gross per 24 hour  Intake   1345 ml  Output   3925 ml  Net  -2580 ml   Filed Weights   01/02/16 0500 01/03/16 0415 01/04/16 0426  Weight: 52.5 kg (115 lb 11.9 oz) 52.844 kg (116 lb 8 oz) 53.661 kg (118 lb 4.8 oz)    Exam Awake Alert, Oriented *3, No new F.N deficits,  Normal affect [place].AT,PERRAL Supple Neck,No JVD, No cervical lymphadenopathy appriciated.  Symmetrical Chest wall movement, Good air movement bilaterally, CTAB RRR,No Gallops,Rubs or new Murmurs, No Parasternal Heave +ve B.Sounds, Abd Soft, Non tender, No organomegaly appriciated, No rebound -guarding or rigidity.  DISCHARGE CONDITION: Stable  DISPOSITION: Home with home health services  DISCHARGE INSTRUCTIONS:    Activity:  As tolerated with Full fall precautions use walker/cane & assistance as needed  Get Medicines reviewed and adjusted: Please take all your medications with you for your next visit with your Primary MD  Please request your Primary MD to go over all hospital tests and procedure/radiological results at the follow up, please ask your Primary MD to get all Hospital records sent to his/her office.  If you experience worsening of your admission symptoms, develop shortness of breath, life threatening emergency, suicidal or homicidal thoughts you must seek medical attention immediately by calling 911 or calling your MD immediately  if symptoms less severe.  You must read complete instructions/literature along with all the possible adverse reactions/side effects for all the Medicines you take and that have been prescribed to you. Take any new Medicines after you have completely understood  and accpet all the possible adverse reactions/side effects.   Do not drive when taking Pain medications.   Do not take more than prescribed Pain, Sleep and Anxiety Medications  Special Instructions: If you have smoked or chewed Tobacco  in the last 2 yrs please stop smoking, stop any regular Alcohol  and or any Recreational drug use.  Wear Seat belts while driving.  Please note  You were cared for by a hospitalist during your hospital stay. Once you are discharged, your primary care physician will handle any further medical issues. Please note that NO REFILLS for any discharge medications will be authorized once you are discharged, as it is imperative that you return to your primary care physician (or establish a relationship with a primary care physician if you do not have one) for your aftercare needs so that they can reassess your need for medications and monitor your lab values.   Diet recommendation: Regular Diet  Discharge Instructions    Call MD for:  difficulty breathing, headache or visual disturbances    Complete by:  As directed      Call MD for:  temperature >100.4    Complete by:  As directed      Diet general    Complete by:  As directed      Increase activity slowly    Complete by:  As directed            Follow-up Information    Follow up with FRY,STEPHEN A, MD. Schedule an appointment as soon as possible for a visit in 1 week.   Specialty:  Family Medicine   Why:  Hospital follow up, Repeat electrolytes   Contact information:   [place] [address] [phone]       Schedule an appointment as soon as possible for a visit in 1 week to follow up.   Why:  Hospital follow up   Contact information:   [facility]      Total Time spent on discharge equals 45 minutes.  SignedOren Binet 01/05/2016 2:03 PM

## 2016-01-05 NOTE — Consult Note (Addendum)
WOC follow-up:  Pt preparing for discharge home today. Negative pressure dressings remain in place to bilat ischium and sacrum with good seal, attached tubing to Medella pump from home at 176mm cont suction, mother has brought this machine which has a charger, and taped all the connection ports.  Dressings scheduled to be changed by home health tomorrow, he can convert to this brand's track pad system at that time.  Mother familiar with pump functions and troubleshooting and denies need for further assistance. Please re-consult if further assistance is needed.  Thank-you,  Julien Girt MSN, [place], [place], [place], [place]

## 2016-01-05 NOTE — Telephone Encounter (Signed)
Script is ready for pick up and I spoke with dad.

## 2016-01-05 NOTE — Progress Notes (Signed)
Speech Language Pathology Treatment: Dysphagia  Patient Details Name: Jason [name] MRN: [id number] DOB: May 08, 1986 Today's Date: 01/05/2016 Time: 9326-7124 SLP Time Calculation (min) (ACUTE ONLY): 9 min  Assessment / Plan / Recommendation Clinical Impression  No indications of aspiration with thin or solid texture during po intake with PMSV donned. He and mom deny difficulty since initiating diet. Small sips and bites consumed without additional cueing needed. Pt has met dysphagia goals. Continue regular and thin liquid with speaking valve. No follow up needed.    HPI HPI: 30 y.o. 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 Since hospital admission, pt has declined and required vent support. Swallow dx ordered yesterday due to improved respiratory status (>24 hours on trach collar). CXR 3/23 ongoing improvement of R side pneumonia. No significant pleural effusion or evidence of CHF. SLP familiar with pt re: swallow and PMSV. Pt currently wears PMSV during all waking hours.      SLP Plan  All goals met;Discharge SLP treatment due to (comment)     Recommendations  Diet recommendations: Regular;Thin liquid Liquids provided via: Cup;Straw Medication Administration: Whole meds with puree Supervision: Staff to assist with self feeding Compensations: Slow rate;Small sips/bites Postural Changes and/or Swallow Maneuvers: Seated upright 90 degrees      Patient may use Passy-Muir Speech Valve: During all waking hours (remove during sleep);During PO intake/meals;Caregiver trained to provide supervision PMSV Supervision: Intermittent      Oral Care Recommendations: Oral care BID Follow up Recommendations: 24 hour supervision/assistance Plan: All goals met;Discharge SLP treatment due to (comment)     GO                [name] 01/05/2016, 11:34 AM Orbie Pyo Colvin Caroli.Ed [employer] [phone]

## 2016-01-10 ENCOUNTER — Telehealth: Payer: Self-pay | Admitting: Family Medicine

## 2016-01-10 NOTE — Telephone Encounter (Signed)
I spoke to the nurse at [facility] and gave a verbal order to not reinsert the rectal tube. They will call if further questions.

## 2016-01-10 NOTE — Telephone Encounter (Signed)
Joelene Millin from [facility] called [phone], pt came home from hospital with rectal flex tube. It came out about 1 hour ago around 4 pm. Pt stool now is a solid consistency, per nurse pt should not need this reinserted. I will forward this note to Dr. Sarajane Jews for review.

## 2016-01-11 ENCOUNTER — Ambulatory Visit (HOSPITAL_COMMUNITY): Payer: Self-pay

## 2016-01-11 ENCOUNTER — Telehealth: Payer: Self-pay | Admitting: Family Medicine

## 2016-01-11 NOTE — Telephone Encounter (Signed)
Jason [name] from [name] [phone]) called to let Dr. Sarajane Jews know that the patient has a rectal tube, colonscotomy, and wound bag. His stool is hard so they had to remove the tube until his stool softens again. The wound care nurse will be back on Friday to give him time to get his colon situated. He has two shaffer wounds that they are using wet packs on. His vitals are normal.

## 2016-01-13 ENCOUNTER — Ambulatory Visit: Payer: Medicaid Other | Admitting: Family Medicine

## 2016-01-17 ENCOUNTER — Inpatient Hospital Stay (HOSPITAL_COMMUNITY)
Admission: EM | Admit: 2016-01-17 | Discharge: 2016-01-26 | DRG: 871 | Disposition: A | Discharge status: Home / Self Care | Payer: Medicaid Other | Attending: Internal Medicine | Admitting: Internal Medicine

## 2016-01-17 ENCOUNTER — Emergency Department (HOSPITAL_COMMUNITY): Payer: Medicaid Other

## 2016-01-17 ENCOUNTER — Encounter (HOSPITAL_COMMUNITY): Payer: Self-pay | Admitting: Emergency Medicine

## 2016-01-17 ENCOUNTER — Other Ambulatory Visit: Payer: Self-pay | Admitting: Family Medicine

## 2016-01-17 DIAGNOSIS — G8220 Paraplegia, unspecified: Secondary | ICD-10-CM | POA: Diagnosis present

## 2016-01-17 DIAGNOSIS — J40 Bronchitis, not specified as acute or chronic: Secondary | ICD-10-CM | POA: Diagnosis not present

## 2016-01-17 DIAGNOSIS — A498 Other bacterial infections of unspecified site: Secondary | ICD-10-CM | POA: Diagnosis not present

## 2016-01-17 DIAGNOSIS — K219 Gastro-esophageal reflux disease without esophagitis: Secondary | ICD-10-CM | POA: Diagnosis present

## 2016-01-17 DIAGNOSIS — Z9911 Dependence on respirator [ventilator] status: Secondary | ICD-10-CM | POA: Diagnosis not present

## 2016-01-17 DIAGNOSIS — Z882 Allergy status to sulfonamides status: Secondary | ICD-10-CM

## 2016-01-17 DIAGNOSIS — Z8673 Personal history of transient ischemic attack (TIA), and cerebral infarction without residual deficits: Secondary | ICD-10-CM

## 2016-01-17 DIAGNOSIS — R569 Unspecified convulsions: Secondary | ICD-10-CM | POA: Diagnosis present

## 2016-01-17 DIAGNOSIS — Z1624 Resistance to multiple antibiotics: Secondary | ICD-10-CM | POA: Diagnosis present

## 2016-01-17 DIAGNOSIS — D62 Acute posthemorrhagic anemia: Secondary | ICD-10-CM | POA: Diagnosis present

## 2016-01-17 DIAGNOSIS — E860 Dehydration: Secondary | ICD-10-CM | POA: Diagnosis present

## 2016-01-17 DIAGNOSIS — Z880 Allergy status to penicillin: Secondary | ICD-10-CM

## 2016-01-17 DIAGNOSIS — J0410 Acute tracheitis without obstruction: Secondary | ICD-10-CM | POA: Diagnosis present

## 2016-01-17 DIAGNOSIS — R739 Hyperglycemia, unspecified: Secondary | ICD-10-CM | POA: Diagnosis not present

## 2016-01-17 DIAGNOSIS — L89214 Pressure ulcer of right hip, stage 4: Secondary | ICD-10-CM | POA: Diagnosis present

## 2016-01-17 DIAGNOSIS — M549 Dorsalgia, unspecified: Secondary | ICD-10-CM | POA: Diagnosis present

## 2016-01-17 DIAGNOSIS — S14106S Unspecified injury at C6 level of cervical spinal cord, sequela: Secondary | ICD-10-CM

## 2016-01-17 DIAGNOSIS — F419 Anxiety disorder, unspecified: Secondary | ICD-10-CM | POA: Diagnosis present

## 2016-01-17 DIAGNOSIS — D638 Anemia in other chronic diseases classified elsewhere: Secondary | ICD-10-CM | POA: Diagnosis present

## 2016-01-17 DIAGNOSIS — E872 Acidosis: Secondary | ICD-10-CM | POA: Diagnosis not present

## 2016-01-17 DIAGNOSIS — Z794 Long term (current) use of insulin: Secondary | ICD-10-CM

## 2016-01-17 DIAGNOSIS — J9611 Chronic respiratory failure with hypoxia: Secondary | ICD-10-CM | POA: Diagnosis not present

## 2016-01-17 DIAGNOSIS — I959 Hypotension, unspecified: Secondary | ICD-10-CM | POA: Diagnosis present

## 2016-01-17 DIAGNOSIS — L89154 Pressure ulcer of sacral region, stage 4: Secondary | ICD-10-CM | POA: Diagnosis present

## 2016-01-17 DIAGNOSIS — Y95 Nosocomial condition: Secondary | ICD-10-CM | POA: Diagnosis present

## 2016-01-17 DIAGNOSIS — N179 Acute kidney failure, unspecified: Secondary | ICD-10-CM | POA: Diagnosis present

## 2016-01-17 DIAGNOSIS — M797 Fibromyalgia: Secondary | ICD-10-CM | POA: Diagnosis present

## 2016-01-17 DIAGNOSIS — N390 Urinary tract infection, site not specified: Secondary | ICD-10-CM | POA: Diagnosis not present

## 2016-01-17 DIAGNOSIS — Z933 Colostomy status: Secondary | ICD-10-CM | POA: Diagnosis not present

## 2016-01-17 DIAGNOSIS — G8929 Other chronic pain: Secondary | ICD-10-CM | POA: Diagnosis present

## 2016-01-17 DIAGNOSIS — B3749 Other urogenital candidiasis: Secondary | ICD-10-CM | POA: Diagnosis present

## 2016-01-17 DIAGNOSIS — Z823 Family history of stroke: Secondary | ICD-10-CM

## 2016-01-17 DIAGNOSIS — Z87891 Personal history of nicotine dependence: Secondary | ICD-10-CM | POA: Diagnosis not present

## 2016-01-17 DIAGNOSIS — Z833 Family history of diabetes mellitus: Secondary | ICD-10-CM | POA: Diagnosis not present

## 2016-01-17 DIAGNOSIS — J9601 Acute respiratory failure with hypoxia: Secondary | ICD-10-CM | POA: Diagnosis present

## 2016-01-17 DIAGNOSIS — R0602 Shortness of breath: Secondary | ICD-10-CM | POA: Diagnosis present

## 2016-01-17 DIAGNOSIS — L8990 Pressure ulcer of unspecified site, unspecified stage: Secondary | ICD-10-CM | POA: Diagnosis present

## 2016-01-17 DIAGNOSIS — E1043 Type 1 diabetes mellitus with diabetic autonomic (poly)neuropathy: Secondary | ICD-10-CM | POA: Diagnosis present

## 2016-01-17 DIAGNOSIS — D649 Anemia, unspecified: Secondary | ICD-10-CM | POA: Diagnosis present

## 2016-01-17 DIAGNOSIS — Z8249 Family history of ischemic heart disease and other diseases of the circulatory system: Secondary | ICD-10-CM | POA: Diagnosis not present

## 2016-01-17 DIAGNOSIS — E871 Hypo-osmolality and hyponatremia: Secondary | ICD-10-CM | POA: Diagnosis present

## 2016-01-17 DIAGNOSIS — Z881 Allergy status to other antibiotic agents status: Secondary | ICD-10-CM

## 2016-01-17 DIAGNOSIS — E875 Hyperkalemia: Secondary | ICD-10-CM | POA: Diagnosis present

## 2016-01-17 DIAGNOSIS — I9589 Other hypotension: Secondary | ICD-10-CM | POA: Diagnosis present

## 2016-01-17 DIAGNOSIS — J151 Pneumonia due to Pseudomonas: Secondary | ICD-10-CM | POA: Diagnosis not present

## 2016-01-17 DIAGNOSIS — J9600 Acute respiratory failure, unspecified whether with hypoxia or hypercapnia: Secondary | ICD-10-CM

## 2016-01-17 DIAGNOSIS — J189 Pneumonia, unspecified organism: Secondary | ICD-10-CM

## 2016-01-17 DIAGNOSIS — A419 Sepsis, unspecified organism: Principal | ICD-10-CM

## 2016-01-17 DIAGNOSIS — E1065 Type 1 diabetes mellitus with hyperglycemia: Secondary | ICD-10-CM | POA: Diagnosis present

## 2016-01-17 DIAGNOSIS — Z23 Encounter for immunization: Secondary | ICD-10-CM | POA: Diagnosis not present

## 2016-01-17 DIAGNOSIS — Z930 Tracheostomy status: Secondary | ICD-10-CM

## 2016-01-17 DIAGNOSIS — Z8614 Personal history of Methicillin resistant Staphylococcus aureus infection: Secondary | ICD-10-CM | POA: Diagnosis not present

## 2016-01-17 DIAGNOSIS — J45909 Unspecified asthma, uncomplicated: Secondary | ICD-10-CM | POA: Diagnosis present

## 2016-01-17 DIAGNOSIS — L89224 Pressure ulcer of left hip, stage 4: Secondary | ICD-10-CM | POA: Diagnosis present

## 2016-01-17 DIAGNOSIS — J9610 Chronic respiratory failure, unspecified whether with hypoxia or hypercapnia: Secondary | ICD-10-CM | POA: Diagnosis not present

## 2016-01-17 DIAGNOSIS — K3184 Gastroparesis: Secondary | ICD-10-CM | POA: Diagnosis present

## 2016-01-17 DIAGNOSIS — E108 Type 1 diabetes mellitus with unspecified complications: Secondary | ICD-10-CM | POA: Diagnosis not present

## 2016-01-17 DIAGNOSIS — J9621 Acute and chronic respiratory failure with hypoxia: Secondary | ICD-10-CM | POA: Diagnosis present

## 2016-01-17 DIAGNOSIS — L89623 Pressure ulcer of left heel, stage 3: Secondary | ICD-10-CM | POA: Diagnosis present

## 2016-01-17 DIAGNOSIS — IMO0002 Reserved for concepts with insufficient information to code with codable children: Secondary | ICD-10-CM | POA: Diagnosis present

## 2016-01-17 LAB — COMPREHENSIVE METABOLIC PANEL
ALBUMIN: 2.2 g/dL — AB (ref 3.5–5.0)
ALT: 15 U/L — ABNORMAL LOW (ref 17–63)
AST: 18 U/L (ref 15–41)
Alkaline Phosphatase: 125 U/L (ref 38–126)
Anion gap: 12 (ref 5–15)
BUN: 37 mg/dL — AB (ref 6–20)
CALCIUM: 8.9 mg/dL (ref 8.9–10.3)
CHLORIDE: 98 mmol/L — AB (ref 101–111)
CO2: 18 mmol/L — AB (ref 22–32)
Creatinine, Ser: 0.95 mg/dL (ref 0.61–1.24)
GFR calc Af Amer: >60 mL/min (ref >60)
GLUCOSE: 377 mg/dL — AB (ref 65–99)
POTASSIUM: 4.8 mmol/L (ref 3.5–5.1)
SODIUM: 128 mmol/L — AB (ref 135–145)
TOTAL PROTEIN: 6.6 g/dL (ref 6.5–8.1)
Total Bilirubin: 0.5 mg/dL (ref 0.3–1.2)

## 2016-01-17 LAB — URINE MICROSCOPIC-ADD ON

## 2016-01-17 LAB — URINALYSIS, ROUTINE W REFLEX MICROSCOPIC
BILIRUBIN URINE: NEGATIVE
Glucose, UA: 500 mg/dL — AB
KETONES UR: NEGATIVE mg/dL
NITRITE: POSITIVE — AB
Protein, ur: 100 mg/dL — AB
Specific Gravity, Urine: 1.019 (ref 1.005–1.030)
pH: 5 (ref 5.0–8.0)

## 2016-01-17 LAB — I-STAT VENOUS BLOOD GAS, ED
Acid-base deficit: 5 mmol/L — ABNORMAL HIGH (ref 0.0–2.0)
Bicarbonate: 20.8 mEq/L (ref 20.0–24.0)
O2 Saturation: 97 %
PCO2 VEN: 38.3 mmHg — AB (ref 45.0–50.0)
PH VEN: 7.343 — AB (ref 7.250–7.300)
TCO2: 22 mmol/L (ref 0–100)
pO2, Ven: 100 mmHg — ABNORMAL HIGH (ref 31.0–45.0)

## 2016-01-17 LAB — CBC WITH DIFFERENTIAL/PLATELET
BASOS ABS: 0 10*3/uL (ref 0.0–0.1)
BASOS PCT: 0 %
EOS ABS: 0 10*3/uL (ref 0.0–0.7)
Eosinophils Relative: 0 %
HCT: 20.3 % — ABNORMAL LOW (ref 39.0–52.0)
Hemoglobin: 6.5 g/dL — CL (ref 13.0–17.0)
LYMPHS ABS: 1.1 10*3/uL (ref 0.7–4.0)
Lymphocytes Relative: 4 %
MCH: 27.2 pg (ref 26.0–34.0)
MCHC: 32 g/dL (ref 30.0–36.0)
MCV: 84.9 fL (ref 78.0–100.0)
MONO ABS: 1.1 10*3/uL — AB (ref 0.1–1.0)
Monocytes Relative: 4 %
NEUTROS ABS: 25.6 10*3/uL — AB (ref 1.7–7.7)
Neutrophils Relative %: 92 %
PLATELETS: 320 10*3/uL (ref 150–400)
RBC: 2.39 MIL/uL — ABNORMAL LOW (ref 4.22–5.81)
RDW: 20.3 % — AB (ref 11.5–15.5)
WBC Morphology: INCREASED
WBC: 27.8 10*3/uL — ABNORMAL HIGH (ref 4.0–10.5)

## 2016-01-17 LAB — I-STAT CG4 LACTIC ACID, ED
LACTIC ACID, VENOUS: 0.99 mmol/L (ref 0.5–2.0)
Lactic Acid, Venous: 1.12 mmol/L (ref 0.5–2.0)

## 2016-01-17 LAB — PREPARE RBC (CROSSMATCH)

## 2016-01-17 MED ORDER — METRONIDAZOLE IN NACL 5-0.79 MG/ML-% IV SOLN
500.0000 mg | Freq: Three times a day (TID) | INTRAVENOUS | Status: DC
  Administered 2016-01-17: 500 mg via INTRAVENOUS
  Filled 2016-01-17: qty 100

## 2016-01-17 MED ORDER — INSULIN GLARGINE 100 UNIT/ML ~~LOC~~ SOLN: 15.0000 [IU] | Freq: Every day | SUBCUTANEOUS | Status: DC

## 2016-01-17 MED ORDER — HEPARIN SODIUM (PORCINE) 5000 UNIT/ML IJ SOLN
5000.0000 [IU] | Freq: Three times a day (TID) | INTRAMUSCULAR | Status: DC
Start: 2016-01-17 — End: 2016-01-26
  Administered 2016-01-18 – 2016-01-26 (×27): 5000 [IU] via SUBCUTANEOUS
  Filled 2016-01-17 (×27): qty 1

## 2016-01-17 MED ORDER — HYDROCORTISONE 10 MG PO TABS
10.0000 mg | ORAL_TABLET | Freq: Two times a day (BID) | ORAL | Status: DC
  Administered 2016-01-18 – 2016-01-21 (×7): 10 mg via ORAL
  Filled 2016-01-17 (×9): qty 1

## 2016-01-17 MED ORDER — HYDROCORTISONE 20 MG PO TABS: 20.0000 mg | ORAL_TABLET | Freq: Every morning | ORAL | Status: DC

## 2016-01-17 MED ORDER — MIDODRINE HCL 5 MG PO TABS
20.0000 mg | ORAL_TABLET | Freq: Three times a day (TID) | ORAL | Status: DC
  Administered 2016-01-18 – 2016-01-26 (×27): 20 mg via ORAL
  Filled 2016-01-17 (×27): qty 4

## 2016-01-17 MED ORDER — BUPROPION HCL ER (XL) 150 MG PO TB24
150.0000 mg | ORAL_TABLET | Freq: Every day | ORAL | Status: DC
  Administered 2016-01-18 – 2016-01-26 (×9): 150 mg via ORAL
  Filled 2016-01-17 (×9): qty 1

## 2016-01-17 MED ORDER — INSULIN ASPART 100 UNIT/ML ~~LOC~~ SOLN
0.0000 [IU] | Freq: Four times a day (QID) | SUBCUTANEOUS | Status: DC | PRN
  Administered 2016-01-20: 6 [IU] via SUBCUTANEOUS
  Filled 2016-01-17: qty 0.1

## 2016-01-17 MED ORDER — SODIUM CHLORIDE 0.9 % IV SOLN
INTRAVENOUS | Status: DC
  Administered 2016-01-18: via INTRAVENOUS

## 2016-01-17 MED ORDER — LORAZEPAM 0.5 MG PO TABS
0.5000 mg | ORAL_TABLET | Freq: Three times a day (TID) | ORAL | Status: DC | PRN
  Administered 2016-01-18: 0.5 mg via ORAL
  Filled 2016-01-17: qty 1

## 2016-01-17 MED ORDER — MAGNESIUM OXIDE 400 (241.3 MG) MG PO TABS
400.0000 mg | ORAL_TABLET | Freq: Two times a day (BID) | ORAL | Status: DC
  Administered 2016-01-18 – 2016-01-26 (×18): 400 mg via ORAL
  Filled 2016-01-17 (×18): qty 1

## 2016-01-17 MED ORDER — SODIUM CHLORIDE 0.9 % IV BOLUS (SEPSIS)
1000.0000 mL | Freq: Once | INTRAVENOUS | Status: AC
  Administered 2016-01-17: 1000 mL via INTRAVENOUS

## 2016-01-17 MED ORDER — FENTANYL 50 MCG/HR TD PT72
50.0000 ug | MEDICATED_PATCH | TRANSDERMAL | Status: DC
  Administered 2016-01-18 – 2016-01-23 (×3): 50 ug via TRANSDERMAL
  Filled 2016-01-17 (×3): qty 1

## 2016-01-17 MED ORDER — FERROUS SULFATE 325 (65 FE) MG PO TABS
325.0000 mg | ORAL_TABLET | Freq: Every day | ORAL | Status: DC
  Administered 2016-01-18 – 2016-01-26 (×9): 325 mg via ORAL
  Filled 2016-01-17 (×9): qty 1

## 2016-01-17 MED ORDER — DEXTROSE 5 % IV SOLN
2.0000 g | Freq: Once | INTRAVENOUS | Status: AC
  Administered 2016-01-17: 2 g via INTRAVENOUS
  Filled 2016-01-17: qty 2

## 2016-01-17 MED ORDER — SODIUM CHLORIDE 0.9 % IV SOLN
Freq: Once | INTRAVENOUS | Status: AC
  Administered 2016-01-18: 02:00:00 via INTRAVENOUS

## 2016-01-17 MED ORDER — METHYLPREDNISOLONE SODIUM SUCC 40 MG IJ SOLR
20.0000 mg | Freq: Two times a day (BID) | INTRAMUSCULAR | Status: DC
  Administered 2016-01-18 – 2016-01-21 (×7): 20 mg via INTRAVENOUS
  Filled 2016-01-17 (×7): qty 1

## 2016-01-17 MED ORDER — ALBUTEROL SULFATE (2.5 MG/3ML) 0.083% IN NEBU: 2.5000 mg | INHALATION_SOLUTION | RESPIRATORY_TRACT | Status: DC | PRN

## 2016-01-17 MED ORDER — FLUCONAZOLE IN SODIUM CHLORIDE 200-0.9 MG/100ML-% IV SOLN
200.0000 mg | INTRAVENOUS | Status: DC
  Administered 2016-01-17: 200 mg via INTRAVENOUS
  Filled 2016-01-17 (×2): qty 100

## 2016-01-17 MED ORDER — FENTANYL CITRATE (PF) 100 MCG/2ML IJ SOLN: 25.0000 ug | INTRAMUSCULAR | Status: DC | PRN

## 2016-01-17 MED ORDER — DOCUSATE SODIUM 50 MG/5ML PO LIQD
100.0000 mg | Freq: Two times a day (BID) | ORAL | Status: DC
  Administered 2016-01-18 – 2016-01-26 (×15): 100 mg via ORAL
  Filled 2016-01-17 (×19): qty 10

## 2016-01-17 MED ORDER — PREGABALIN 50 MG PO CAPS
100.0000 mg | ORAL_CAPSULE | Freq: Three times a day (TID) | ORAL | Status: DC
  Administered 2016-01-18 – 2016-01-26 (×27): 100 mg
  Filled 2016-01-17 (×7): qty 2
  Filled 2016-01-17: qty 4
  Filled 2016-01-17 (×19): qty 2

## 2016-01-17 MED ORDER — FENTANYL CITRATE (PF) 100 MCG/2ML IJ SOLN
25.0000 ug | Freq: Four times a day (QID) | INTRAMUSCULAR | Status: DC | PRN
  Administered 2016-01-17 – 2016-01-19 (×2): 50 ug via INTRAVENOUS
  Administered 2016-01-19 (×2): 25 ug via INTRAVENOUS
  Administered 2016-01-20 – 2016-01-26 (×11): 50 ug via INTRAVENOUS
  Administered 2016-01-26: 25 ug via INTRAVENOUS
  Filled 2016-01-17 (×17): qty 2

## 2016-01-17 MED ORDER — COLLAGENASE 250 UNIT/GM EX OINT
1.0000 | TOPICAL_OINTMENT | CUTANEOUS | Status: DC
Start: 2016-01-18 — End: 2016-01-18
  Filled 2016-01-17 (×2): qty 30

## 2016-01-17 MED ORDER — OXYCODONE HCL 5 MG PO TABS
20.0000 mg | ORAL_TABLET | Freq: Four times a day (QID) | ORAL | Status: DC | PRN
  Administered 2016-01-18 – 2016-01-26 (×12): 20 mg via ORAL
  Filled 2016-01-17 (×12): qty 4

## 2016-01-17 MED ORDER — SODIUM CHLORIDE 0.9 % IV SOLN
500.0000 mg | INTRAVENOUS | Status: DC
  Administered 2016-01-18 – 2016-01-20 (×3): 500 mg via INTRAVENOUS
  Filled 2016-01-17 (×4): qty 500

## 2016-01-17 MED ORDER — HYDROCORTISONE 10 MG PO TABS: 10.0000 mg | ORAL_TABLET | Freq: Every evening | ORAL | Status: DC

## 2016-01-17 MED ORDER — BACLOFEN 20 MG PO TABS
20.0000 mg | ORAL_TABLET | Freq: Four times a day (QID) | ORAL | Status: DC
  Administered 2016-01-18 – 2016-01-26 (×35): 20 mg via ORAL
  Filled 2016-01-17 (×39): qty 1

## 2016-01-17 MED ORDER — LORATADINE 10 MG PO TABS
10.0000 mg | ORAL_TABLET | Freq: Every day | ORAL | Status: DC
  Administered 2016-01-18 – 2016-01-26 (×10): 10 mg via ORAL
  Filled 2016-01-17 (×10): qty 1

## 2016-01-17 MED ORDER — HYDROMORPHONE HCL 1 MG/ML IJ SOLN
1.0000 mg | Freq: Once | INTRAMUSCULAR | Status: AC
  Administered 2016-01-17: 1 mg via INTRAVENOUS
  Filled 2016-01-17: qty 1

## 2016-01-17 MED ORDER — SODIUM CHLORIDE 0.9 % IV SOLN
250.0000 mL | INTRAVENOUS | Status: DC | PRN
  Administered 2016-01-23: 250 mL via INTRAVENOUS

## 2016-01-17 MED ORDER — DEXTROSE 5 % IV SOLN
1.0000 g | Freq: Two times a day (BID) | INTRAVENOUS | Status: DC
  Administered 2016-01-18 – 2016-01-21 (×6): 1 g via INTRAVENOUS
  Filled 2016-01-17 (×10): qty 1

## 2016-01-17 MED ORDER — FAMOTIDINE IN NACL 20-0.9 MG/50ML-% IV SOLN
20.0000 mg | Freq: Two times a day (BID) | INTRAVENOUS | Status: DC
  Administered 2016-01-18 – 2016-01-21 (×7): 20 mg via INTRAVENOUS
  Filled 2016-01-17 (×7): qty 50

## 2016-01-17 MED ORDER — VANCOMYCIN HCL IN DEXTROSE 1-5 GM/200ML-% IV SOLN
1000.0000 mg | Freq: Once | INTRAVENOUS | Status: AC
  Administered 2016-01-17: 1000 mg via INTRAVENOUS
  Filled 2016-01-17: qty 200

## 2016-01-17 MED ORDER — HYDROCORTISONE NA SUCCINATE PF 100 MG IJ SOLR
100.0000 mg | Freq: Once | INTRAMUSCULAR | Status: AC
  Administered 2016-01-17: 100 mg via INTRAVENOUS
  Filled 2016-01-17: qty 2

## 2016-01-17 NOTE — ED Provider Notes (Signed)
CSN: [id number]     Arrival date & time 01/17/16  1520 History   None    Chief Complaint  Patient presents with  . Shortness of Breath     (Consider location/radiation/quality/duration/timing/severity/associated sxs/prior Treatment) Patient is a 30 y.o. male presenting with shortness of breath. The history is provided by the patient and the nursing home.  Shortness of Breath Severity:  Moderate Onset quality:  Gradual Duration:  1 week Timing:  Constant Progression:  Worsening Chronicity:  Recurrent Relieved by:  Nothing Exacerbated by: Lying flat for dressing changes. Ineffective treatments:  Inhaler Associated symptoms: cough and fever   Associated symptoms: no abdominal pain, no chest pain, no headaches, no neck pain, no rash, no sputum production, no vomiting and no wheezing     Past Medical History  Diagnosis Date  . GERD (gastroesophageal reflux disease)   . Asthma   . Hx MRSA infection     on face  . Gastroparesis   . Diabetic neuropathy ([place])   . Seizures ([name])   . Family history of anesthesia complication     Pt mother can't have epidural procedures  . Dysrhythmia   . Pneumonia   . Arthritis   . Fibromyalgia   . 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 Type I diabetes mellitus ([place])     sees Dr. Loanne Drilling   . Syncope 02/16/2015  . Multiple allergies 11/02/2015  . Recurrent UTI 11/02/2015   Past Surgical History  Procedure Laterality Date  . Tonsillectomy    . Multiple extractions with alveoloplasty N/A 08/03/2014    Procedure: MULTIPLE EXTRACTIONS;  Surgeon: Gae Bon, DDS;  Location: [place];  Service: Oral Surgery;  Laterality: N/A;  . Tee without cardioversion N/A 08/17/2014    Procedure: TRANSESOPHAGEAL ECHOCARDIOGRAM (TEE);  Surgeon: Dorothy Spark, MD;  Location: [facility] ENDOSCOPY;  Service: Cardiovascular;  Laterality: N/A;  . Debridment of decubitus ulcer N/A 10/04/2014    Procedure: DEBRIDMENT OF DECUBITUS ULCER;  Surgeon:  Georganna Skeans, MD;  Location: [place];  Service: General;  Laterality: N/A;  . Laparoscopic diverted colostomy N/A 10/12/2014    Procedure: LAPAROSCOPIC DIVERTING COLOSTOMY;  Surgeon: Donnie Mesa, MD;  Location: [place];  Service: General;  Laterality: N/A;  . Insertion of suprapubic catheter N/A 10/12/2014    Procedure: INSERTION OF SUPRAPUBIC CATHETER;  Surgeon: Reece Packer, MD;  Location: [place];  Service: Urology;  Laterality: N/A;  . Incision and drainage of wound N/A 11/12/2014    Procedure: IRRIGATION AND DEBRIDEMENT OF WOUNDS WITH BONE BIOPSY AND SURGICAL PREP ;  Surgeon: Theodoro Kos, DO;  Location: [place];  Service: Plastics;  Laterality: N/A;  . Application of a-cell of back N/A 11/12/2014    Procedure: APPLICATION A CELL AND VAC ;  Surgeon: Theodoro Kos, DO;  Location: [place];  Service: Plastics;  Laterality: N/A;  . Incision and drainage of wound N/A 11/18/2014    Procedure: IRRIGATION AND DEBRIDEMENT OF SACRAL ULCER ONLY WITH PLACEMENT OF A CELL AND VAC/ DRESSING CHANGE TO UPPER BACK AREA.;  Surgeon: Theodoro Kos, DO;  Location: [place];  Service: Plastics;  Laterality: N/A;  . Incision and drainage of wound N/A 11/25/2014    Procedure: IRRIGATION AND DEBRIDEMENT OF SACRAL ULCER AND BACK BURN WITH PLACEMENT OF A-CELL;  Surgeon: Theodoro Kos, DO;  Location: [place];  Service: Plastics;  Laterality: N/A;  . Minor application of wound vac N/A 11/25/2014    Procedure:  WOUND VAC CHANGE;  Surgeon: Theodoro Kos, DO;  Location: [place];  Service: Plastics;  Laterality: N/A;  . Incision and drainage of wound Right 12/08/2014    Procedure: IRRIGATION AND DEBRIDEMENT SACRAL WOUND AND RIGHT ISCHIAL WOUND ;  Surgeon: Theodoro Kos, DO;  Location: [place];  Service: Plastics;  Laterality: Right;  . Application of a-cell of back N/A 12/08/2014    Procedure: APPLICATION OF A-CELL AND WOUND VAC ;  Surgeon: Theodoro Kos, DO;  Location: [place];  Service: Plastics;  Laterality: N/A;  . Tee without cardioversion N/A  05/05/2015    Procedure: TRANSESOPHAGEAL ECHOCARDIOGRAM (TEE);  Surgeon: Lelon Perla, MD;  Location: [place];  Service: Cardiovascular;  Laterality: N/A;  . Incision and drainage of wound Bilateral 05/05/2015    Procedure: IRRIGATION AND DEBRIDEMENT SACRAL ULCER;  Surgeon: Theodoro Kos, DO;  Location: [place];  Service: Plastics;  Laterality: Bilateral;  . Hematoma evacuation N/A 05/05/2015    Procedure: EVACUATION HEMATOMA bedside procedure;  Surgeon: Theodoro Kos, DO;  Location: [place];  Service: Plastics;  Laterality: N/A;  . Incision and drainage of wound N/A 08/04/2015    Procedure: IRRIGATION AND DEBRIDEMENT OF SACRAL ULCER AND ;  Surgeon: Loel Lofty Dillingham, DO;  Location: [place];  Service: Plastics;  Laterality: N/A;  . Application of a-cell of chest/abdomen N/A 08/04/2015    Procedure: APPLICATION OF A-CELL ;  Surgeon: Loel Lofty Dillingham, DO;  Location: [place];  Service: Plastics;  Laterality: N/A;   Family History  Problem Relation Age of Onset  . Diabetes Father   . Hypertension Father   . Asthma      fhx  . Hypertension      fhx  . Stroke      fhx  . Heart disease Mother    Social History  Substance Use Topics  . Smoking status: Former Smoker -- 1.00 packs/day for 12 years    Types: Cigars, Cigarettes    Quit date: 09/07/2014  . Smokeless tobacco: Never Used  . Alcohol Use: No    Review of Systems  Constitutional: Positive for fever, chills and fatigue.  HENT: Negative for congestion and rhinorrhea.   Eyes: Negative for visual disturbance.  Respiratory: Positive for cough and shortness of breath. Negative for sputum production and wheezing.   Cardiovascular: Negative for chest pain.  Gastrointestinal: Negative for nausea, vomiting, abdominal pain and diarrhea.  Genitourinary: Negative for flank pain.  Musculoskeletal: Negative for neck pain.  Skin: Positive for wound. Negative for rash.  Neurological: Negative for syncope, weakness and headaches.       Allergies  Cefuroxime axetil; Ertapenem; Morphine and related; Penicillins; Sulfa antibiotics; Tessalon; Shellfish allergy; Levaquin; Nsaids; and Miripirium  Home Medications   Prior to Admission medications   Medication Sig Start Date End Date Taking? Authorizing Provider  acetaminophen (TYLENOL) 325 MG tablet Take 2 tablets (650 mg total) by mouth every 6 (six) hours as needed for mild pain, moderate pain, fever or headache. 01/14/15  Yes Jessica U Vann, DO  albuterol (PROVENTIL) (2.5 MG/3ML) 0.083% nebulizer solution Take 3 mLs (2.5 mg total) by nebulization every 2 (two) hours as needed for wheezing or shortness of breath. 01/05/16  Yes Shanker Kristeen Mans, MD  albuterol (PROVENTIL) (2.5 MG/3ML) 0.083% nebulizer solution Take 3 mLs (2.5 mg total) by nebulization 4 (four) times daily. 01/05/16  Yes Shanker Kristeen Mans, MD  baclofen (LIORESAL) 20 MG tablet Take 1 tablet (20 mg total) by mouth 4 (four) times daily. 09/20/15  Yes Meredith Staggers, MD  BISACODYL 5 MG EC tablet Take ONE (1) tablet by mouth twice daily as needed FOR MODERATE CONSTIPATION 11/21/15  Yes [name], MD  buPROPion (WELLBUTRIN XL) 150 MG 24 hr tablet Take 1 tablet (150 mg total) by mouth daily. 11/29/15  Yes [name], MD  camphor-menthol [facility]) lotion Apply topically as needed for itching. 09/02/15  Yes Shanker Kristeen Mans, MD  collagenase Ann[facility] Needs) ointment Apply 1 application topically every Monday, Wednesday, and Friday. Apply to necrotic tissue in the sacral wound only. 10/11/15  Yes [name], MD  diclofenac sodium (VOLTAREN) 1 % GEL Apply 2 g topically 2 (two) times daily. 09/27/15  Yes [name], MD  fentaNYL (DURAGESIC - DOSED MCG/HR) 50 MCG/HR Place 1 patch (50 mcg total) onto the skin every 3 (three) days. 01/05/16  Yes [name], MD  ferrous sulfate 325 (65 FE) MG tablet Take 1 tablet (325 mg total) by mouth daily with breakfast. 09/27/15  Yes [name], MD  glucagon (GLUCAGON EMERGENCY) 1 MG  injection INJECT INTO MUSCLE ONCE AS NEEDED Patient taking differently: Inject 1 mg into the muscle once as needed (low blood sugar).  07/05/15  Yes Renato Shin, MD  GLUCERNA (GLUCERNA) LIQD 1,000 mLs by PEG Tube route continuous. 70 ml/hr   Yes Historical Provider, MD  hydrocortisone (CORTEF) 10 MG tablet Take 1 tablet (10 mg total) by mouth every evening. 01/05/16  Yes Shanker Kristeen Mans, MD  hydrocortisone (CORTEF) 20 MG tablet Take 1 tablet (20 mg total) by mouth every morning. 01/05/16  Yes Shanker Kristeen Mans, MD  insulin aspart (NOVOLOG FLEXPEN) 100 UNIT/ML FlexPen Inject 0-10 Units into the skin 4 (four) times daily as needed for high blood sugar (CBG >200). Per sliding scale: CBG <200 0 units, 201-300 4 units, 301-400 6 units, >400 10 units   Yes Historical Provider, MD  insulin glargine (LANTUS) 100 UNIT/ML injection Inject 0.15 mLs (15 Units total) into the skin at bedtime. 08/16/15  Yes Janece Canterbury, MD  loratadine (CLARITIN) 10 MG tablet Take 1 tablet (10 mg total) by mouth daily. 11/25/15  Yes [name], MD  LORazepam (ATIVAN) 0.5 MG tablet Take 0.5 mg by mouth 3 (three) times daily as needed for anxiety. Reported on 11/02/2015   Yes Historical Provider, MD  Magnesium Oxide 400 MG CAPS Take 1 capsule (400 mg total) by mouth 2 (two) times daily. 09/27/15  Yes [name], MD  midodrine (PROAMATINE) 10 MG tablet Take 2 tablets (20 mg total) by mouth 3 (three) times daily with meals. 10/28/15  Yes [name], MD  Multiple Vitamin (MULTIVITAMIN WITH MINERALS) TABS tablet Place 1 tablet into feeding tube daily. Patient taking differently: 1 tablet by PEG Tube route daily.  06/01/15  Yes Erick Colace, NP  mupirocin ointment (BACTROBAN) 2 % Apply to affected area twice a day as needed 12/14/15  Yes [name], MD  ondansetron (ZOFRAN ODT) 8 MG disintegrating tablet Place 1 tablet (8 mg total) into feeding tube every 8 (eight) hours as needed for nausea or vomiting. Patient taking  differently: 8 mg by PEG Tube route every 8 (eight) hours as needed for nausea or vomiting.  06/01/15  Yes Erick Colace, NP  Oxycodone HCl 20 MG TABS Take 1 tablet (20 mg total) by mouth every 6 (six) hours as needed (pain). 01/05/16  Yes [name], MD  pantoprazole sodium (PROTONIX) 40 mg/20 mL PACK Place 20 mLs (40 mg total) into  feeding tube daily. Patient taking differently: 40 mg by PEG Tube route daily.  06/01/15  Yes Erick Colace, NP  pregabalin (LYRICA) 100 MG capsule Place 1 capsule (100 mg total) into feeding tube 3 (three) times daily. Take 100 mg by mouth in the morning, take 100 mg by mouth in the afternoon, take 100 mg by mouth in the evening and take 200 mg by mouth at bedtime. 08/19/15  Yes [name], MD  saccharomyces boulardii (FLORASTOR) 250 MG capsule Take 1 capsule (250 mg total) by mouth 2 (two) times daily. 08/16/15  Yes Janece Canterbury, MD  Water For Irrigation, Sterile (FREE WATER) SOLN Place 200 mLs into feeding tube every 8 (eight) hours. 10/11/15  Yes [name], MD  docusate (COLACE) 50 MG/5ML liquid Take 10 mLs (100 mg total) by mouth 2 (two) times daily. 11/10/15   [name], MD  glucose blood (ACCU-CHEK SMARTVIEW) test strip Use to check blood sugar 4 times per day. Dx Code: E11.9. Appointment needed for further refills. 12/13/15   Renato Shin, MD  SURE COMFORT INS SYR 1CC/29G 29G X 1/2" 1 ML MISC Use As Directed 11/04/15   [name], MD   BP 97/64 mmHg  Pulse 93  Temp(Src) 97.4 F (36.3 C) (Axillary)  Resp 23  Ht 5\' 8"  (1.727 m)  Wt 50.8 kg  BMI 17.03 kg/m2  SpO2 100% Physical Exam  Constitutional: Vital signs are normal. He appears well-developed. He has a sickly appearance. No distress.  HENT:  Mouth/Throat: No oropharyngeal exudate.  Eyes: Conjunctivae are normal. Pupils are equal, round, and reactive to light.  Neck: Neck supple.  Trach site c/d/i  Cardiovascular: Normal rate and normal heart sounds.  Exam reveals no friction rub.   No  murmur heard. Pulmonary/Chest: Tachypnea noted. He has decreased breath sounds. He has rhonchi in the right upper field, the right middle field, the right lower field, the left upper field, the left middle field and the left lower field. He has no rales.  Abdominal: Soft. He exhibits no distension.  Musculoskeletal: He exhibits no edema.  Neurological: He is alert. No cranial nerve deficit.  Skin: Skin is warm.  Non-stageable sacral decubitus ulcer. Wound site appears viable, with intact granulation tissue. No purulence.    ED Course  Procedures (including critical care time) Labs Review Labs Reviewed  MRSA PCR SCREENING - Abnormal; Notable for the following:    MRSA by PCR POSITIVE (*)    All other components within normal limits  COMPREHENSIVE METABOLIC PANEL - Abnormal; Notable for the following:    Sodium 128 (*)    Chloride 98 (*)    CO2 18 (*)    Glucose, Bld 377 (*)    BUN 37 (*)    Albumin 2.2 (*)    ALT 15 (*)    All other components within normal limits  CBC WITH DIFFERENTIAL/PLATELET - Abnormal; Notable for the following:    WBC 27.8 (*)    RBC 2.39 (*)    Hemoglobin 6.5 (*)    HCT 20.3 (*)    RDW 20.3 (*)    Neutro Abs 25.6 (*)    Monocytes Absolute 1.1 (*)    All other components within normal limits  URINALYSIS, ROUTINE W REFLEX MICROSCOPIC (NOT AT [facility]) - Abnormal; Notable for the following:    APPearance TURBID (*)    Glucose, UA 500 (*)    Hgb urine dipstick MODERATE (*)    Protein, ur 100 (*)  Nitrite POSITIVE (*)    Leukocytes, UA LARGE (*)    All other components within normal limits  URINE MICROSCOPIC-ADD ON - Abnormal; Notable for the following:    Squamous Epithelial / LPF 0-5 (*)    Bacteria, UA FEW (*)    All other components within normal limits  I-STAT VENOUS BLOOD GAS, ED - Abnormal; Notable for the following:    pH, Ven 7.343 (*)    pCO2, Ven 38.3 (*)    pO2, Ven 100.0 (*)    Acid-base deficit 5.0 (*)    All other components within  normal limits  CULTURE, BLOOD (ROUTINE X 2)  CULTURE, BLOOD (ROUTINE X 2)  CULTURE, RESPIRATORY (NON-EXPECTORATED)  URINE CULTURE  CBC  BASIC METABOLIC PANEL  MAGNESIUM  PHOSPHORUS  I-STAT CG4 LACTIC ACID, ED  I-STAT CG4 LACTIC ACID, ED  TYPE AND SCREEN  PREPARE RBC (CROSSMATCH)    Imaging Review Dg Chest Portable 1 View  01/17/2016  CLINICAL DATA:  Shortness of Breath EXAM: PORTABLE CHEST 1 VIEW COMPARISON:  December 29, 2015 FINDINGS: Tracheostomy catheter tip is 5.1 cm above the carina. No pneumothorax. There is focal airspace consolidation in the right mid lung as well as in both bases, likely multifocal pneumonia. Heart size is within normal limits. Pulmonary vascularity is normal. No adenopathy. There is evidence of old trauma with remodeling in the lateral right clavicle. IMPRESSION: Findings most likely due to multifocal pneumonia. Aspiration is a differential consideration. Tracheostomy as described without pneumothorax. Stable cardiac silhouette. Electronically Signed   By: Lowella Grip III M.D.   On: 01/17/2016 16:44   I have personally reviewed and evaluated these images and lab results as part of my medical decision-making.   EKG Interpretation   Date/Time:  Tuesday January 17 2016 15:52:51 EDT Ventricular Rate:  99 PR Interval:  125 QRS Duration: 67 QT Interval:  323 QTC Calculation: 414 R Axis:   71 Text Interpretation:  Sinus rhythm ST elev, probable normal early repol  pattern Confirmed by KNAPP  MD-J, JON [id number]) on 01/18/2016 12:47:07 AM      MDM   30 yo M with PMHx of T1DM, h/o spinal stroke, now quadriplegic s/p trach, G-Tube, foley placementw ho p/w fever, SOB, increasing hypoxia at home. On arrival, pt mildly tachycardic, o/w HDS with no SBP<90. Exam as above. Primary suspicion is sepsis 2/2 HAP, UTI, possible decubitus infection though wound appears well cared for. Will start IVF, broad labs, and cultures.   Labs/imagin as above. BC with WBC 27.8 with  bandemia, c/w acute infection. UA with nitrite positive, leukocyte positive - possible UTI though chronic cath makes this difficult to assess. CXR shows multifocal PNA - likely etiology of infection. CMP with hyperglycemia but normal gap. Cx pending. Broad-spectrum ABX ordered, with Vanc/Cefepime per pharmacy reccs. Will admit to ICU given intermittent vent requirement with multifocal PNA. Resp quant ordered. Pt family updated and in agreement.  Clinical Impression: 1. Sepsis 2. Hospital acquired pneumonia 3. Acute respiratory failure with hypoxia 4. Status post tracheostomy  Disposition: Admit  Condition: Stable  Pt seen in conjunction with Dr. Andrey Cota, MD 01/18/16 [id number]  Dorie Rank, MD 01/19/16 2144

## 2016-01-17 NOTE — ED Notes (Signed)
Heart healthy dinner tray ordered for pt @ 19:02pm.

## 2016-01-17 NOTE — ED Notes (Signed)
Blood bank called to notify of unit ready

## 2016-01-17 NOTE — Progress Notes (Signed)
Patient was brought to ED by EMS on patient's home ventilator.  Patient was placed on hospital ventilator, on home settings, and is currently tolerating well.  RT will continue to monitor.

## 2016-01-17 NOTE — Telephone Encounter (Signed)
Pt needs a antibiotic (ciprofloxacin) he was release from the hospital 10 days ago without a antibiotic he is having a low grade fever at night.  Pts mother states that he usually is on an antibiotic for 7-10 days after he gets out of the hospital to keep him from going back into the hospital.  Pharm: [facility]

## 2016-01-17 NOTE — Telephone Encounter (Signed)
Spoke to Dr. Regis Bill who advises that pulmonary get involved.  Called and spoke to the patient's mom and advised her to call pulmonary.  Treasa [employer] (mother) agreed to call.

## 2016-01-17 NOTE — Progress Notes (Addendum)
Pharmacy Antibiotic Note  Jason [name] is a 30 y.o. male admitted on 01/17/2016 with pneumonia.  Pharmacy has been consulted for vancomycin, fluconazole and cefepime dosing. 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 He was recently on vancomycin and cefepime for a MRSA and pseudomonas pneumonia. He required pre-treatment with solu-medrol and famotidine prior to cefepime in the past due to cefuroxime allergy. Due to paraplegia, renal fxn is difficult to estimate. Scr is slightly above baseline.   Plan: - Vancomycin 1gm IV x 1 then 500mg  IV Q24H - Cefepime 2gm IV x 1 then 1gm IV Q12H - F/u renal fxn, C&S, clinical status and trough at SS - Pre-medicate cefepime with solu-medrol 20mg  and famotidine 20mg  - consider DC if pt continue to tolerate cefepime     Temp (24hrs), Avg:98.6 F (37 C), Min:98.6 F (37 C), Max:98.6 F (37 C)   Recent Labs Lab 01/17/16 1638 01/17/16 1645  WBC  --  27.8*  CREATININE  --  0.95  LATICACIDVEN 0.99  --     Estimated Creatinine Clearance: 85.6 mL/min (by C-G formula based on Cr of 0.95).    Allergies  Allergen Reactions  . Cefuroxime Axetil Anaphylaxis    Tolerated cefepime 12/2015 - with Pepcid/Solu-Medrol  . Ertapenem Other (See Comments)    Rash and confusion-->tolerated Imipenem   . Morphine And Related Other (See Comments)    Changed mental status, confusion, headache, visual hallucination  . Penicillins Anaphylaxis and Other (See Comments)    Tolerated Imipenem; no reaction to 7 day course of amoxicillin in 2015 Has patient had a PCN reaction causing immediate rash, facial/tongue/throat swelling, SOB or lightheadedness with hypotension: Yes Has patient had a PCN reaction causing severe rash involving mucus membranes or skin necrosis: No Has patient had a PCN reaction that required hospitalization Yes Has patient had a PCN reaction occurring within the last 10 years: No If all of the above answers  are "NO", then may proceed with Cephalo  . Sulfa Antibiotics Anaphylaxis, Shortness Of Breath and Other (See Comments)  . Tessalon [Benzonatate] Anaphylaxis  . Shellfish Allergy Itching and Other (See Comments)    Took benadryl to alleviate reaction  . Levaquin [Levofloxacin In D5w] Swelling    Hand and forearm swelling  . Nsaids Itching and Other (See Comments)    Risk of bleeding, itching  . Miripirium Rash and Other (See Comments)    Change in mental status    Antimicrobials this admission: Vanc 4/11>> Cefepime 4/11>> Flagyl 4/11>> Fluconazole 4/11>>  Dose adjustments this admission: N/A  Microbiology results: Pending  Thank you for allowing pharmacy to be a part of this patient's care.  Brinae Woods, Rande Lawman 01/17/2016 7:28 PM

## 2016-01-17 NOTE — ED Notes (Signed)
Pt is paraplegic from home. Pt's home RN turned pt to do sacral wound changes and pt desaturated to 70%. Pt was placed on vent and sats came up to 100%. HR 100. BP 98/60.

## 2016-01-17 NOTE — ED Notes (Signed)
RN attempted IV/labs. Unsuccessful. Notified MD that pt needs ultrasound IV

## 2016-01-17 NOTE — ED Notes (Signed)
Emptied pt's ostomy bag

## 2016-01-17 NOTE — H&P (Signed)
Name: Jason [name] MRN: [id number] DOB: [birth year]    ADMISSION DATE:  01/17/2016 CONSULTATION DATE:  01/17/16  REFERRING MD :  EDP  CHIEF COMPLAINT:  SOB   HISTORY OF PRESENT ILLNESS:  Jason [name] is a 30 y.o. 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  He is well known to our service from multiple prior admissions.  He has hx of MDR pseudomonas and MRSA.  He had recent prolonged admission 12/06/15 through 01/05/16 for worsening hypoxic respiratory failure due to HCAP.  He was discharged home and reports that he had done well since discharge. On 01/16/16, he spiked fever to 102 and mother reports that she suctioned thick yellow sputum from pt's trach.  On 01/17/16, temp dropped to 101F but pt still had thick sputum along with mild SOB.  Due to concern for developing PNA, his mother thought it would be best to bring him to ED for further evaluation.  In ED, CXR was concerning for multifocal PNA.  In addition, he was found to have UTI and anemia with Hgb 6.5.  Mother reports that he has had a little blood from his sacral wounds and had a feeling that his Hgb would be on the low side.  He was initially placed on full vent support in ED, but shortly after asked to come off so that he could eat.  During my exam, he is resting comfortably, has PMV in and is eating dinner without problems.  He denies any chest pain, SOB, N/V/D, abd pain, myalgias.  He does have persistent back pain, unchanged from baseline.  He is requesting to go home.   PAST MEDICAL HISTORY :   has a past medical history of GERD (gastroesophageal reflux disease); Asthma; MRSA infection; Gastroparesis; Diabetic neuropathy ([place]); Seizures ([place]); Family history of anesthesia complication; Dysrhythmia; Pneumonia; Arthritis; Fibromyalgia; Stroke ([place]) (01/29/2014); Type I diabetes mellitus ([place]); Syncope  (02/16/2015); Multiple allergies (11/02/2015); and Recurrent UTI (11/02/2015).  has past surgical history that includes Tonsillectomy; Multiple extractions with alveoloplasty (N/A, 08/03/2014); TEE without cardioversion (N/A, 08/17/2014); Debridment of decubitus ulcer (N/A, 10/04/2014); Laparoscopic diverted colostomy (N/A, 10/12/2014); Insertion of suprapubic catheter (N/A, 10/12/2014); Incision and drainage of wound (N/A, 11/12/2014); Application of a-cell of back (N/A, 11/12/2014); Incision and drainage of wound (N/A, 11/18/2014); Incision and drainage of wound (N/A, 11/25/2014); Minor application of wound vac (N/A, 11/25/2014); Incision and drainage of wound (Right, 12/08/2014); Application of a-cell of back (N/A, 12/08/2014); TEE without cardioversion (N/A, 05/05/2015); Incision and drainage of wound (Bilateral, 05/05/2015); Hematoma evacuation (N/A, 05/05/2015); Incision and drainage of wound (N/A, [id number]); and Application of a-cell of chest/abdomen (N/A, 08/04/2015). Prior to Admission medications   Medication Sig Start Date End Date Taking? Authorizing Provider  acetaminophen (TYLENOL) 325 MG tablet Take 2 tablets (650 mg total) by mouth every 6 (six) hours as needed for mild pain, moderate pain, fever or headache. 01/14/15   Geradine Girt, DO  albuterol (PROVENTIL) (2.5 MG/3ML) 0.083% nebulizer solution Take 3 mLs (2.5 mg total) by nebulization every 2 (two) hours as needed for wheezing or shortness of breath. 01/05/16   Shanker Kristeen Mans, MD  albuterol (PROVENTIL) (2.5 MG/3ML) 0.083% nebulizer solution Take 3 mLs (2.5 mg total) by nebulization 4 (four) times daily. 01/05/16   Shanker Kristeen Mans, MD  baclofen (LIORESAL) 20 MG tablet Take 1 tablet (20 mg total) by mouth 4 (four) times daily. 09/20/15   Celesta Gentile  Naaman Plummer, MD  BISACODYL 5 MG EC tablet Take ONE (1) tablet by mouth twice daily as needed FOR MODERATE CONSTIPATION 11/21/15   [name], MD  buPROPion (WELLBUTRIN XL) 150 MG 24 hr tablet Take 1 tablet (150 mg  total) by mouth daily. 11/29/15   [name], MD  camphor-menthol [facility]) lotion Apply topically as needed for itching. 09/02/15   Shanker Kristeen Mans, MD  collagenase (SANTYL) ointment Apply 1 application topically every Monday, Wednesday, and Friday. Apply to necrotic tissue in the sacral wound only. 10/11/15   [name], MD  diclofenac sodium (VOLTAREN) 1 % GEL Apply 2 g topically 2 (two) times daily. 09/27/15   [name], MD  docusate (COLACE) 50 MG/5ML liquid Take 10 mLs (100 mg total) by mouth 2 (two) times daily. 11/10/15   [name], MD  fentaNYL (DURAGESIC - DOSED MCG/HR) 50 MCG/HR Place 1 patch (50 mcg total) onto the skin every 3 (three) days. 01/05/16   [name], MD  ferrous sulfate 325 (65 FE) MG tablet Take 1 tablet (325 mg total) by mouth daily with breakfast. 09/27/15   [name], MD  glucagon (GLUCAGON EMERGENCY) 1 MG injection INJECT INTO MUSCLE ONCE AS NEEDED Patient taking differently: Inject 1 mg into the muscle once as needed (low blood sugar).  07/05/15   Renato Shin, MD  GLUCERNA (GLUCERNA) LIQD 1,000 mLs by PEG Tube route continuous. 70 ml/hr    Historical Provider, MD  glucose blood (ACCU-CHEK SMARTVIEW) test strip Use to check blood sugar 4 times per day. Dx Code: E11.9. Appointment needed for further refills. 12/13/15   Renato Shin, MD  hydrocortisone (CORTEF) 10 MG tablet Take 1 tablet (10 mg total) by mouth every evening. 01/05/16   Shanker Kristeen Mans, MD  hydrocortisone (CORTEF) 20 MG tablet Take 1 tablet (20 mg total) by mouth every morning. 01/05/16   Shanker Kristeen Mans, MD  insulin aspart (NOVOLOG FLEXPEN) 100 UNIT/ML FlexPen Inject 0-10 Units into the skin 4 (four) times daily as needed for high blood sugar (CBG >200). Per sliding scale: CBG <200 0 units, 201-300 4 units, 301-400 6 units, >400 10 units    Historical Provider, MD  insulin glargine (LANTUS) 100 UNIT/ML injection Inject 0.15 mLs (15 Units total) into the skin at bedtime. 08/16/15   Janece Canterbury, MD  loratadine (CLARITIN) 10 MG tablet Take 1 tablet (10 mg total) by mouth daily. 11/25/15   [name], MD  LORazepam (ATIVAN) 0.5 MG tablet Take 0.5 mg by mouth 3 (three) times daily as needed for anxiety. Reported on 11/02/2015    Historical Provider, MD  Magnesium Oxide 400 MG CAPS Take 1 capsule (400 mg total) by mouth 2 (two) times daily. 09/27/15   [name], MD  midodrine (PROAMATINE) 10 MG tablet Take 2 tablets (20 mg total) by mouth 3 (three) times daily with meals. 10/28/15   [name], MD  Multiple Vitamin (MULTIVITAMIN WITH MINERALS) TABS tablet Place 1 tablet into feeding tube daily. Patient taking differently: 1 tablet by PEG Tube route daily.  06/01/15   Erick Colace, NP  mupirocin ointment (BACTROBAN) 2 % Apply to affected area twice a day as needed 12/14/15   [name], MD  ondansetron (ZOFRAN ODT) 8 MG disintegrating tablet Place 1 tablet (8 mg total) into feeding tube every 8 (eight) hours as needed for nausea or vomiting. Patient taking differently: 8 mg by PEG Tube route every 8 (eight)  hours as needed for nausea or vomiting.  06/01/15   Erick Colace, NP  Oxycodone HCl 20 MG TABS Take 1 tablet (20 mg total) by mouth every 6 (six) hours as needed (pain). 01/05/16   [name], MD  pantoprazole sodium (PROTONIX) 40 mg/20 mL PACK Place 20 mLs (40 mg total) into feeding tube daily. Patient taking differently: 40 mg by PEG Tube route daily.  06/01/15   Erick Colace, NP  pregabalin (LYRICA) 100 MG capsule Place 1 capsule (100 mg total) into feeding tube 3 (three) times daily. Take 100 mg by mouth in the morning, take 100 mg by mouth in the afternoon, take 100 mg by mouth in the evening and take 200 mg by mouth at bedtime. 08/19/15   [name], MD  saccharomyces boulardii (FLORASTOR) 250 MG capsule Take 1 capsule (250 mg total) by mouth 2 (two) times daily. 08/16/15   Janece Canterbury, MD  SURE COMFORT INS SYR 1CC/29G 29G X 1/2" 1 ML MISC Use As Directed  11/04/15   [name], MD  Water For Irrigation, Sterile (FREE WATER) SOLN Place 200 mLs into feeding tube every 8 (eight) hours. 10/11/15   [name], MD   Allergies  Allergen Reactions  . Cefuroxime Axetil Anaphylaxis    Tolerated cefepime 12/2015 - with Pepcid/Solu-Medrol  . Ertapenem Other (See Comments)    Rash and confusion-->tolerated Imipenem   . Morphine And Related Other (See Comments)    Changed mental status, confusion, headache, visual hallucination  . Penicillins Anaphylaxis and Other (See Comments)    Tolerated Imipenem; no reaction to 7 day course of amoxicillin in 2015 Has patient had a PCN reaction causing immediate rash, facial/tongue/throat swelling, SOB or lightheadedness with hypotension: Yes Has patient had a PCN reaction causing severe rash involving mucus membranes or skin necrosis: No Has patient had a PCN reaction that required hospitalization Yes Has patient had a PCN reaction occurring within the last 10 years: No If all of the above answers are "NO", then may proceed with Cephalo  . Sulfa Antibiotics Anaphylaxis, Shortness Of Breath and Other (See Comments)  . Tessalon [Benzonatate] Anaphylaxis  . Shellfish Allergy Itching and Other (See Comments)    Took benadryl to alleviate reaction  . Levaquin [Levofloxacin In D5w] Swelling    Hand and forearm swelling  . Nsaids Itching and Other (See Comments)    Risk of bleeding, itching  . Miripirium Rash and Other (See Comments)    Change in mental status    FAMILY HISTORY:  family history includes Diabetes in his father; Heart disease in his mother; Hypertension in his father. SOCIAL HISTORY:  reports that he quit smoking about 16 months ago. His smoking use included Cigars and Cigarettes. He has a 12 pack-year smoking history. He has never used smokeless tobacco. He reports that he does not drink alcohol or use illicit drugs.  REVIEW OF SYSTEMS:   All negative; except for those that are bolded, which  indicate positives.  Constitutional: weight loss, weight gain, night sweats, fevers, chills, fatigue, weakness.  HEENT: headaches, sore throat, sneezing, nasal congestion, post nasal drip, difficulty swallowing, tooth/dental problems, visual complaints, visual changes, ear aches. Neuro: difficulty with speech, weakness, numbness, ataxia. CV:  chest pain, orthopnea, PND, swelling in lower extremities, dizziness, palpitations, syncope.  Resp: cough, hemoptysis, dyspnea - resolved, wheezing, thick yellow sputum. GI  heartburn, indigestion, abdominal pain, nausea, vomiting, diarrhea, constipation, change in bowel habits, loss of appetite, hematemesis, melena, hematochezia.  GU: dysuria, change in color of urine, urgency or frequency, flank pain, hematuria. MSK: joint pain or swelling, decreased range of motion, chronic back pain. Psych: change in mood or affect, depression, anxiety, suicidal ideations, homicidal ideations. Skin: rash, itching, bruising.    SUBJECTIVE:  Eating dinner, requesting to go home.  VITAL SIGNS: Temp:  [98.6 F (37 C)] 98.6 F (37 C) (04/11 1532) Pulse Rate:  [92-103] 103 (04/11 2100) Resp:  [11-34] 19 (04/11 2100) BP: (90-113)/(60-77) 102/71 mmHg (04/11 2100) SpO2:  [96 %-100 %] 100 % (04/11 2100) FiO2 (%):  [28 %-40 %] 28 % (04/11 2042)  PHYSICAL EXAMINATION: General: Young adult AA male, eating dinner,  in NAD. Neuro: A&O x 3, non-focal.  HEENT: [place]/AT. PERRL, sclerae anicteric. Trach site clean. Cardiovascular: RRR, no M/R/G.  Lungs: Respirations even and unlabored.  Coarse bilateral bases R > L. Abdomen: BS x 4, soft, NT/ND.  Musculoskeletal: Muscle wasting throughout, bilateral ankles in pressure relief boots. Skin: Intact, warm, no rashes.     Recent Labs Lab 01/17/16 1645  NA 128*  K 4.8  CL 98*  CO2 18*  BUN 37*  CREATININE 0.95  GLUCOSE 377*    Recent Labs Lab 01/17/16 1645  HGB 6.5*  HCT 20.3*  WBC 27.8*  PLT 320   Dg Chest  Portable 1 View  01/17/2016  CLINICAL DATA:  Shortness of Breath EXAM: PORTABLE CHEST 1 VIEW COMPARISON:  December 29, 2015 FINDINGS: Tracheostomy catheter tip is 5.1 cm above the carina. No pneumothorax. There is focal airspace consolidation in the right mid lung as well as in both bases, likely multifocal pneumonia. Heart size is within normal limits. Pulmonary vascularity is normal. No adenopathy. There is evidence of old trauma with remodeling in the lateral right clavicle. IMPRESSION: Findings most likely due to multifocal pneumonia. Aspiration is a differential consideration. Tracheostomy as described without pneumothorax. Stable cardiac silhouette. Electronically Signed   By: Lowella Grip III M.D.   On: 01/17/2016 16:44    STUDIES:  CXR 04/11 > multifocal PNA.  CULTURES: Blood 04/11 > Urine 04/11 > Sputum 04/11 >  ANTIBIOTICS: Vanc 04/11 > Cefepime 04/11 > Diflucan 04/11 >  SIGNIFICANT EVENTS  04/11 > admitted with HCAP.  LINES / TUBES: Chronic trach / PEG >  ASSESSMENT / PLAN:  Chronic respiratory failure - s/p tracheostomy.  Has tolerated PMV most of the day, only required vent support during wound care. HCAP. Tracheostomy status. Hx MDR pseudomonas and MRSA PNA. Plan: Continue PMV as tolerated. Full vent support as needed only. Abx / cultures as below. Continue outpatient albuterol. Pulmonary hygiene. CXR in AM.  HCAP with probable tracheitis. UTI - also with yeast. Plan: Continue empiric abx (vanc / cefepime / diflucan). Follow cultures. Routine wound care with santyl dressings (defer wound care consult for now as per mother, wounds are chronic / unchanged, and she has been following wound care recs from past).  Chronic hypotension - on midodrine and was on solucortef last admission which was gradually being weaned following discharge. Plan: Continue outpatient midodrine, cortef. Monitor hemodynamics.  Hyponatremia - chronic. Plan: NS @ 50/hr. Correct  electrolytes as indicated. BMP in AM.  AoC anemia - per mother, pt has had some mild blood in his sacral wounds. VTE prophylaxis. Plan: Transfuse 1u PRBC. Heparin SQ. CBC in AM.  Hx GERD, gastroparesis. Nutrition. Plan: Continue outpatient PPI. Carb mod / heart healthy diet.  DM. Plan: Continue SSI. Hold lantus for now.  Hx chronic pain, diabetic neuropathy, fibromyalgia,  anxiety, paraplegia, seizures (not on meds). Plan: Continue outpatient fentanyl patch, baclofen, bupropion, lorazepam, lyrica, oxycodone.   Family updates:  Mother and Father at bedside.  CC time:  40 minutes.  If remains stable overnight and tomorrow 01/18/16 without needing increased ventilator support time, etc. Then can transfer to [facility] service and PCCM sign off.  He will follow up in trach clinic post discharge.   Montey Hora, [place] - [facility] [place] [facility] Pager: [phone]  or [phone] 01/17/2016, 9:24 PM  PCCM Attending Note: Patient seen & examined with physicians assistant. Please refer to his admission H&P that I reviewed in detail. Patient presenting with fever and worsening hypoxia. Increased "yellow" secretions per his mother. Does report some mildly increased work of breathing. Has a good appetite & no nausea. Hasn't had his urinary catheter changed since last admission.  BP 93/72 mmHg  Pulse 97  Temp(Src) 98.1 F (36.7 C) (Oral)  Resp 20  SpO2 93% General:  Sitting up in bed. Mother at bedside. No distress. Integument:  Warm & dry. No rash on exposed skin. Pulmonary:  Mildly increased work of breathing on T-collar. PMV in place. Coarse breath sounds bilaterally.  CBC Latest Ref Rng 01/17/2016 01/04/2016 01/03/2016  WBC 4.0 - 10.5 K/uL 27.8(H) 23.3(H) 25.0(H)  Hemoglobin 13.0 - 17.0 g/dL 6.5(LL) 8.0(L) 7.8(L)  Hematocrit 39.0 - 52.0 % 20.3(L) 25.9(L) 25.6(L)  Platelets 150 - 400 K/uL 320 941(HH) 913(HH)    BMP Latest Ref Rng 01/17/2016 01/05/2016  01/04/2016  Glucose 65 - 99 mg/dL 377(H) 156(H) 306(H)  BUN 6 - 20 mg/dL 37(H) 32(H) 45(H)  Creatinine 0.61 - 1.24 mg/dL 0.95 0.60(L) 1.01  Sodium 135 - 145 mmol/L 128(L) 137 126(L)  Potassium 3.5 - 5.1 mmol/L 4.8 3.4(L) 5.6(H)  Chloride 101 - 111 mmol/L 98(L) 110 96(L)  CO2 22 - 32 mmol/L 18(L) 20(L) 23  Calcium 8.9 - 10.3 mg/dL 8.9 7.0(L) 9.2    A/P:  [name] with sepsis likely secondary to UTI versus new pneumonia. Questionable new basilar opacities on portable CXR imaging. Urine certainly suggestive of infection but could also indicate colonization with catheter. Nitrate is now positive. No clear signs of fulminant respiratory compromise. Plan to monitor closely in case he needs to initiate home ventilator.  1. HCAP w/ Probable Tracheobronchitis:  Empiric Vancomycin, Cefepime, & Diflucan. Awaiting repeat cultures. 2. UTI:  Empiric Vancomycin, Cefepime & Diflucan. Changing out urinary catheter. Awaiting urine culture. 3. Chronic Hypoxic Respiratory Failure:  Full Vent Support as needed. PMV as tolerated. 4. Anemia:  No signs of active bleeding. Transfusing 1u PRBC. Repeat CBC in AM. 5. Hyperglycemia w/ H/O DM:  Holding Lantus. SSI per algorithm.  Sonia Baller Ashok Cordia, M.D. [facility] Pager:  [phone] After 3pm or if no response, call [phone] 12:44 AM 01/18/2016

## 2016-01-18 ENCOUNTER — Inpatient Hospital Stay (HOSPITAL_COMMUNITY): Payer: Medicaid Other

## 2016-01-18 DIAGNOSIS — Z930 Tracheostomy status: Secondary | ICD-10-CM

## 2016-01-18 LAB — BASIC METABOLIC PANEL
ANION GAP: 14 (ref 5–15)
ANION GAP: 12 (ref 5–15)
Anion gap: 19 — ABNORMAL HIGH (ref 5–15)
Anion gap: 16 — ABNORMAL HIGH (ref 5–15)
BUN: 48 mg/dL — AB (ref 6–20)
BUN: 56 mg/dL — ABNORMAL HIGH (ref 6–20)
BUN: 57 mg/dL — ABNORMAL HIGH (ref 6–20)
BUN: 56 mg/dL — ABNORMAL HIGH (ref 6–20)
CALCIUM: 8.9 mg/dL (ref 8.9–10.3)
CO2: 11 mmol/L — ABNORMAL LOW (ref 22–32)
CO2: 13 mmol/L — ABNORMAL LOW (ref 22–32)
CO2: 15 mmol/L — ABNORMAL LOW (ref 22–32)
CO2: 17 mmol/L — ABNORMAL LOW (ref 22–32)
CREATININE: 1.44 mg/dL — AB (ref 0.61–1.24)
Calcium: 8.8 mg/dL — ABNORMAL LOW (ref 8.9–10.3)
Calcium: 9 mg/dL (ref 8.9–10.3)
Calcium: 9.1 mg/dL (ref 8.9–10.3)
Chloride: 98 mmol/L — ABNORMAL LOW (ref 101–111)
Chloride: 100 mmol/L — ABNORMAL LOW (ref 101–111)
Chloride: 100 mmol/L — ABNORMAL LOW (ref 101–111)
Chloride: 101 mmol/L (ref 101–111)
Creatinine, Ser: 1.62 mg/dL — ABNORMAL HIGH (ref 0.61–1.24)
Creatinine, Ser: 1.42 mg/dL — ABNORMAL HIGH (ref 0.61–1.24)
Creatinine, Ser: 1.36 mg/dL — ABNORMAL HIGH (ref 0.61–1.24)
GFR calc Af Amer: >60 mL/min (ref >60)
GFR calc Af Amer: >60 mL/min (ref >60)
GFR, EST NON AFRICAN AMERICAN: 55 mL/min — AB (ref >60)
GLUCOSE: 495 mg/dL — AB (ref 65–99)
GLUCOSE: 357 mg/dL — AB (ref 65–99)
Glucose, Bld: 539 mg/dL — ABNORMAL HIGH (ref 65–99)
Glucose, Bld: 143 mg/dL — ABNORMAL HIGH (ref 65–99)
POTASSIUM: 4.8 mmol/L (ref 3.5–5.1)
POTASSIUM: 4.7 mmol/L (ref 3.5–5.1)
POTASSIUM: 4.8 mmol/L (ref 3.5–5.1)
Potassium: 5.1 mmol/L (ref 3.5–5.1)
SODIUM: 128 mmol/L — AB (ref 135–145)
SODIUM: 129 mmol/L — AB (ref 135–145)
SODIUM: 130 mmol/L — AB (ref 135–145)
Sodium: 129 mmol/L — ABNORMAL LOW (ref 135–145)

## 2016-01-18 LAB — GLUCOSE, CAPILLARY
GLUCOSE-CAPILLARY: 588 mg/dL — AB (ref 65–99)
GLUCOSE-CAPILLARY: 542 mg/dL — AB (ref 65–99)
GLUCOSE-CAPILLARY: 470 mg/dL — AB (ref 65–99)
GLUCOSE-CAPILLARY: 258 mg/dL — AB (ref 65–99)
GLUCOSE-CAPILLARY: 214 mg/dL — AB (ref 65–99)
Glucose-Capillary: 504 mg/dL — ABNORMAL HIGH (ref 65–99)
Glucose-Capillary: 442 mg/dL — ABNORMAL HIGH (ref 65–99)
Glucose-Capillary: 427 mg/dL — ABNORMAL HIGH (ref 65–99)
Glucose-Capillary: 415 mg/dL — ABNORMAL HIGH (ref 65–99)
Glucose-Capillary: 349 mg/dL — ABNORMAL HIGH (ref 65–99)
Glucose-Capillary: 277 mg/dL — ABNORMAL HIGH (ref 65–99)

## 2016-01-18 LAB — POCT I-STAT 3, ART BLOOD GAS (G3+)
ACID-BASE DEFICIT: 10 mmol/L — AB (ref 0.0–2.0)
Acid-base deficit: 12 mmol/L — ABNORMAL HIGH (ref 0.0–2.0)
Bicarbonate: 17.8 mEq/L — ABNORMAL LOW (ref 20.0–24.0)
Bicarbonate: 17.3 mEq/L — ABNORMAL LOW (ref 20.0–24.0)
O2 Saturation: 95 %
O2 Saturation: 96 %
PCO2 ART: 61.1 mmHg — AB (ref 35.0–45.0)
PH ART: 7.212 — AB (ref 7.350–7.450)
PO2 ART: 103 mmHg — AB (ref 80.0–100.0)
TCO2: 20 mmol/L (ref 0–100)
TCO2: 19 mmol/L (ref 0–100)
pCO2 arterial: 43.2 mmHg (ref 35.0–45.0)
pH, Arterial: 7.077 — CL (ref 7.350–7.450)
pO2, Arterial: 108 mmHg — ABNORMAL HIGH (ref 80.0–100.0)

## 2016-01-18 LAB — CBC
HCT: 23.5 % — ABNORMAL LOW (ref 39.0–52.0)
Hemoglobin: 7.4 g/dL — ABNORMAL LOW (ref 13.0–17.0)
MCH: 27.2 pg (ref 26.0–34.0)
MCHC: 31.5 g/dL (ref 30.0–36.0)
MCV: 86.4 fL (ref 78.0–100.0)
PLATELETS: 322 10*3/uL (ref 150–400)
RBC: 2.72 MIL/uL — AB (ref 4.22–5.81)
RDW: 18.8 % — ABNORMAL HIGH (ref 11.5–15.5)
WBC: 29.7 10*3/uL — ABNORMAL HIGH (ref 4.0–10.5)

## 2016-01-18 LAB — MRSA PCR SCREENING: MRSA by PCR: POSITIVE — AB

## 2016-01-18 LAB — PHOSPHORUS: Phosphorus: 6.3 mg/dL — ABNORMAL HIGH (ref 2.5–4.6)

## 2016-01-18 LAB — MAGNESIUM: MAGNESIUM: 1.7 mg/dL (ref 1.7–2.4)

## 2016-01-18 MED ORDER — ALBUTEROL SULFATE (2.5 MG/3ML) 0.083% IN NEBU
2.5000 mg | INHALATION_SOLUTION | Freq: Four times a day (QID) | RESPIRATORY_TRACT | Status: DC
  Administered 2016-01-18 – 2016-01-20 (×8): 2.5 mg via RESPIRATORY_TRACT
  Filled 2016-01-18 (×8): qty 3

## 2016-01-18 MED ORDER — CHLORHEXIDINE GLUCONATE 0.12 % MT SOLN
15.0000 mL | Freq: Two times a day (BID) | OROMUCOSAL | Status: DC
  Administered 2016-01-18 – 2016-01-26 (×16): 15 mL via OROMUCOSAL
  Filled 2016-01-18 (×11): qty 15

## 2016-01-18 MED ORDER — DEXTROSE-NACL 5-0.45 % IV SOLN
INTRAVENOUS | Status: DC
  Administered 2016-01-18: 19:00:00 via INTRAVENOUS

## 2016-01-18 MED ORDER — CHLORHEXIDINE GLUCONATE CLOTH 2 % EX PADS
6.0000 | MEDICATED_PAD | Freq: Every day | CUTANEOUS | Status: AC
  Administered 2016-01-19 – 2016-01-23 (×5): 6 via TOPICAL

## 2016-01-18 MED ORDER — CETYLPYRIDINIUM CHLORIDE 0.05 % MT LIQD
7.0000 mL | Freq: Two times a day (BID) | OROMUCOSAL | Status: DC
  Administered 2016-01-18 – 2016-01-26 (×16): 7 mL via OROMUCOSAL

## 2016-01-18 MED ORDER — SODIUM CHLORIDE 0.9 % IV SOLN
INTRAVENOUS | Status: DC
  Administered 2016-01-18: 17.4 [IU]/h via INTRAVENOUS
  Administered 2016-01-19: 4 [IU]/h via INTRAVENOUS
  Filled 2016-01-18: qty 2.5

## 2016-01-18 MED ORDER — MUPIROCIN 2 % EX OINT
1.0000 "application " | TOPICAL_OINTMENT | Freq: Two times a day (BID) | CUTANEOUS | Status: AC
  Administered 2016-01-18 – 2016-01-22 (×10): 1 via NASAL
  Filled 2016-01-18 (×2): qty 22

## 2016-01-18 MED ORDER — INSULIN ASPART 100 UNIT/ML ~~LOC~~ SOLN: 2.0000 [IU] | SUBCUTANEOUS | Status: DC

## 2016-01-18 MED ORDER — PNEUMOCOCCAL VAC POLYVALENT 25 MCG/0.5ML IJ INJ
0.5000 mL | INJECTION | INTRAMUSCULAR | Status: AC
  Administered 2016-01-21: 0.5 mL via INTRAMUSCULAR
  Filled 2016-01-18: qty 0.5

## 2016-01-18 MED ORDER — INSULIN GLARGINE 100 UNIT/ML ~~LOC~~ SOLN
15.0000 [IU] | Freq: Every day | SUBCUTANEOUS | Status: DC
  Administered 2016-01-18: 15 [IU] via SUBCUTANEOUS
  Filled 2016-01-18 (×2): qty 0.15

## 2016-01-18 MED ORDER — GLUCERNA 1.2 CAL PO LIQD
1000.0000 mL | ORAL | Status: DC
  Administered 2016-01-18 – 2016-01-23 (×8): 1000 mL
  Filled 2016-01-18 (×17): qty 1000

## 2016-01-18 MED ORDER — DEXTROSE 10 % IV SOLN: INTRAVENOUS | Status: DC | PRN

## 2016-01-18 MED ORDER — SODIUM CHLORIDE 0.9 % IV SOLN
INTRAVENOUS | Status: DC
  Administered 2016-01-18: 5.3 [IU]/h via INTRAVENOUS
  Filled 2016-01-18: qty 2.5

## 2016-01-18 MED ORDER — INSULIN GLARGINE 100 UNIT/ML ~~LOC~~ SOLN
8.0000 [IU] | Freq: Every day | SUBCUTANEOUS | Status: DC
  Filled 2016-01-18: qty 0.08

## 2016-01-18 MED ORDER — SODIUM CHLORIDE 0.9 % IV SOLN: INTRAVENOUS | Status: DC

## 2016-01-18 NOTE — Progress Notes (Signed)
ABG results given to Chelsea Primus, RN. Will continue to monitor.

## 2016-01-18 NOTE — Progress Notes (Signed)
Patient still obtunded at this time. Will no longer open eyes to painful stimulus or communicate with RN. Elink MD notified. ABG taken at this time. PH: 7.07, pCO2: 61.1, Bicarb: 17.8. Elink MD notified of this result.Patient put on ventilator at full support. Will continue to monitor patient. Gildardo Cranker, RN

## 2016-01-18 NOTE — Progress Notes (Signed)
Patient obtunded on arrival to shift tonight.  Notified ELink at this time. Patient woke up as [place] in. Patient did not follow commands or respond to staff RN at this time. Will continue to monitor patient. Gildardo Cranker, RN

## 2016-01-18 NOTE — Progress Notes (Signed)
Arrived to patients room, pt obtunded, RNs at bedside. ELINK looked at patient on camera, and this RT  placed pt back on vent. Stat ABG obtained and critical results called to Anitra Lauth RN. Will continue to monitor.

## 2016-01-18 NOTE — Progress Notes (Signed)
Inpatient Diabetes Program Recommendations  AACE/ADA: New Consensus Statement on Inpatient Glycemic Control (2015)  Target Ranges:  Prepandial:   less than 140 mg/dL      Peak postprandial:   less than 180 mg/dL (1-2 hours)      Critically ill patients:  140 - 180 mg/dL   Review of Glycemic Control:  Results for Jason, [name] (MRN [id number]) as of 01/18/2016 11:28  Ref. Range 01/18/2016 05:19  Sodium Latest Ref Range: 135-145 mmol/L 128 (L)  Potassium Latest Ref Range: 3.5-5.1 mmol/L 5.1  Chloride Latest Ref Range: 101-111 mmol/L 98 (L)  CO2 Latest Ref Range: 22-32 mmol/L 11 (L)  BUN Latest Ref Range: 6-20 mg/dL 48 (H)  Creatinine Latest Ref Range: 0.61-1.24 mg/dL 1.44 (H)  Calcium Latest Ref Range: 8.9-10.3 mg/dL 8.9  EGFR (Non-African Amer.) Latest Ref Range: >60 mL/min >60  EGFR (African American) Latest Ref Range: >60 mL/min >60  Glucose Latest Ref Range: 65-99 mg/dL 539 (H)  Anion gap Latest Ref Range: 5-15  19 (H)  Phosphorus Latest Ref Range: 2.5-4.6 mg/dL 6.3 (H)  Magnesium Latest Ref Range: 1.7-2.4 mg/dL 1.7  Results for Jason, [name] (MRN [id number]) as of 01/18/2016 11:28  Ref. Range 01/18/2016 08:35  Glucose-Capillary Latest Ref Range: 65-99 mg/dL 504 (H)    Diabetes history: Type 1 diabetes Outpatient Diabetes medications: Lantus 15 units q HS, Novolog correction starting at 201 mg/dL Current orders for Inpatient glycemic control:  IV insulin via ICU glycemic control order set  Inpatient Diabetes Program Recommendations:    Consider DKA order set instead of ICU glycemic control.  Paged MD to discuss.  Note that patient has Type 1 diabetes and therefore will need individualized regimen.  He has history of hypo and hyperglycemia with wide fluctuations of blood sugars in the hospital.  Therefore, once he is ready for transition off insulin drip, consider home dose of Lantus 15 units 2 hours prior to d/c of insulin drip.  Also may consider less aggressive correction  scale that starts at 151 mg/dL- 1 unit for every 50 mg/dL greater than 151 mg/dL.  Spoke to RN, and she states that patient will also likely restart tube feeds today.  Thanks, Adah Perl, RN, BC-ADM Inpatient Diabetes Coordinator Pager [phone] (8a-5p)

## 2016-01-18 NOTE — Progress Notes (Signed)
1600 Pt difficult to arouse and Na level low, spoke with Elink RN. Will alert MD. Awaiting further order. Will cont to monitor.   1930 Pt difficult to arouse, E link notified via camera but pt opened eyes and nodded when RN apologized for startling him as he wasn't responding when his name was called. Elink camera came on but pt was responsive.

## 2016-01-18 NOTE — Progress Notes (Signed)
eLink Physician-Brief Progress Note Patient Name: Jason [name] DOB: [birth year] MRN: [id number]   Date of Service  01/18/2016  HPI/Events of Note  Pt noted to be obtunded with reduced mental status. Note hypoventilation with gasping respiratory pattern.   eICU Interventions  Will place back on vent, check stat ABG. Vital otherwise stable, will monitor.      Intervention Category Major Interventions: Change in mental status - evaluation and management  Laverle Hobby 01/18/2016, 8:13 PM

## 2016-01-18 NOTE — Progress Notes (Signed)
Patient family notified RN that on last visit to hospital patient has multiple reactions to antibiotics including a "coma-like" state. Also patient is very sensitive to insulin. Elink has been notified. Will continue to monitor patient. Gildardo Cranker, RN

## 2016-01-18 NOTE — Progress Notes (Addendum)
Name: CORMICK [name] MRN: [id number] DOB: 05-Apr-1986    ADMISSION DATE:  01/17/2016 CONSULTATION DATE:  01/17/16  REFERRING MD :  EDP  CHIEF COMPLAINT:  SOB   HISTORY OF PRESENT ILLNESS:  REGINALD MCCALEB is a 30 y.o. 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  He is well known to our service from multiple prior admissions.  He has hx of MDR pseudomonas and MRSA.  He had recent prolonged admission 12/06/15 through 01/05/16 for worsening hypoxic respiratory failure due to HCAP.  He was discharged home and reports that he had done well since discharge. On 01/16/16, he spiked fever to 102 and mother reports that she suctioned thick yellow sputum from pt's trach.  On 01/17/16, temp dropped to 101F but pt still had thick sputum along with mild SOB.  Due to concern for developing PNA, his mother thought it would be best to bring him to ED for further evaluation.  In ED, CXR was concerning for multifocal PNA.  In addition, he was found to have UTI and anemia with Hgb 6.5.  Mother reports that he has had a little blood from his sacral wounds and had a feeling that his Hgb would be on the low side.  He was initially placed on full vent support in ED, but shortly after asked to come off so that he could eat.  During my exam, he is resting comfortably, has PMV in and is eating dinner without problems.  He denies any chest pain, SOB, N/V/D, abd pain, myalgias.  He does have persistent back pain, unchanged from baseline.  He is requesting to go home.  SUBJECTIVE:  No events overnight, off vent.  VITAL SIGNS: Temp:  [97.4 F (36.3 C)-98.6 F (37 C)] 97.4 F (36.3 C) (04/12 0350) Pulse Rate:  [87-103] 87 (04/12 0849) Resp:  [11-34] 22 (04/12 0849) BP: (89-115)/(56-77) 89/57 mmHg (04/12 0849) SpO2:  [93 %-100 %] 100 % (04/12 0849) FiO2 (%):  [28 %-40 %] 28 % (04/12 0849) Weight:  [50.8  kg (111 lb 15.9 oz)] 50.8 kg (111 lb 15.9 oz) (04/12 0329)  PHYSICAL EXAMINATION: General: Young adult AA male, eating dinner,  in NAD. Neuro: A&O x 3, non-focal.  HEENT: [place]/AT. PERRL, sclerae anicteric. Trach site clean. Cardiovascular: RRR, no M/R/G.  Lungs: Respirations even and unlabored.  Coarse bilateral bases R > L. Abdomen: BS x 4, soft, NT/ND.  Musculoskeletal: Muscle wasting throughout, bilateral ankles in pressure relief boots. Skin: Intact, warm, no rashes.  Recent Labs Lab 01/17/16 1645 01/18/16 0519  NA 128* 128*  K 4.8 5.1  CL 98* 98*  CO2 18* 11*  BUN 37* 48*  CREATININE 0.95 1.44*  GLUCOSE 377* 539*   Recent Labs Lab 01/17/16 1645 01/18/16 0519  HGB 6.5* 7.4*  HCT 20.3* 23.5*  WBC 27.8* 29.7*  PLT 320 322   Dg Chest Port 1 View  01/18/2016  CLINICAL DATA:  Respiratory failure. EXAM: PORTABLE CHEST 1 VIEW COMPARISON:  No prior . FINDINGS: Tracheostomy tube noted in good anatomic position. Stable cardiomegaly. Normal pulmonary vascularity. Stable diffuse bilateral nodular pulmonary infiltrates, no interim clearing. Low lung volumes. No pleural effusion or pneumothorax. IMPRESSION: 1.  Tracheostomy tube in stable position. 2. Persistent unchanged diffuse prominent nodular bilateral pulmonary infiltrates. Electronically Signed   By: Marcello Moores  Register   On: 01/18/2016 07:22   Dg Chest Portable 1 View  01/17/2016  CLINICAL DATA:  Shortness of Breath EXAM: PORTABLE CHEST 1 VIEW COMPARISON:  December 29, 2015 FINDINGS: Tracheostomy catheter tip is 5.1 cm above the carina. No pneumothorax. There is focal airspace consolidation in the right mid lung as well as in both bases, likely multifocal pneumonia. Heart size is within normal limits. Pulmonary vascularity is normal. No adenopathy. There is evidence of old trauma with remodeling in the lateral right clavicle. IMPRESSION: Findings most likely due to multifocal pneumonia. Aspiration is a differential consideration.  Tracheostomy as described without pneumothorax. Stable cardiac silhouette. Electronically Signed   By: Lowella Grip III M.D.   On: 01/17/2016 16:44   STUDIES:  CXR 04/11 > multifocal PNA.  CULTURES: Blood 04/11 > Urine 04/11 > Sputum 04/11 >  ANTIBIOTICS: Vanc 04/11 > Cefepime 04/11 > Diflucan 04/11 >  SIGNIFICANT EVENTS  04/11 > admitted with HCAP.  LINES / TUBES: Chronic trach / PEG >  I reviewed CXR myself, trach in good position, infiltrate noted.  ASSESSMENT / PLAN:  Chronic respiratory failure - s/p tracheostomy.  Has tolerated PMV most of the day, only required vent support during wound care. HCAP. Tracheostomy status. Hx MDR pseudomonas and MRSA PNA. Plan: Continue PMV as tolerated. Vent 2 hours q shift at home, will continue. Abx/cultures as below. Continue outpatient albuterol. Pulmonary hygiene. CXR in AM.  HCAP with probable tracheitis. UTI - also with yeast. Plan: Continue empiric abx (vanc / cefepime / diflucan). Follow cultures. Routine wound care with santyl dressings (defer wound care consult for now as per mother, wounds are chronic / unchanged, and she has been following wound care recs from past).  Chronic hypotension - on midodrine and was on solucortef last admission which was gradually being weaned following discharge. Plan: Continue outpatient midodrine, cortef. Monitor hemodynamics.  Hyponatremia - chronic. Plan: KVO IVF Correct electrolytes as indicated. BMP in AM.  AoC anemia - per mother, pt has had some mild blood in his sacral wounds. VTE prophylaxis. Plan: Transfuse 1u PRBC. Heparin SQ. CBC in AM.  Hx GERD, gastroparesis. Nutrition. Plan: Continue outpatient PPI. Carb mod / heart healthy diet.  DM. Plan: Continue SSI. Restart lantus at home dose.  Hx chronic pain, diabetic neuropathy, fibromyalgia, anxiety, paraplegia, seizures (not on meds). Plan: Continue outpatient fentanyl patch, baclofen, bupropion,  lorazepam, lyrica, oxycodone.  Family updates:  Mother updated bedside by me.  Will hold in the SDU and transfer care to [facility] with PCCM as consult for vent management.  Discussed with TRH MD.  Rush Farmer, M.D. [facility] Pulmonary/Critical Care Medicine. Pager: [phone]. After hours pager: [phone].

## 2016-01-18 NOTE — Progress Notes (Signed)
Initial Nutrition Assessment  DOCUMENTATION CODES:   Not applicable  INTERVENTION:  -Provide Glucerna 1.2 via PEG @ 44mL/hr increase by 10 every 3 hours to goal rate of 110 mL/hr over 12 hours. -At goal rate, provides 1600 calories, 80g protein, 1073cc free water -Encouraged PO intake as mentation improves  NUTRITION DIAGNOSIS:   Inadequate oral intake related to nausea, poor appetite as evidenced by per patient/family report.  GOAL:   Patient will meet greater than or equal to 90% of their needs  MONITOR:   PO intake, Labs, TF tolerance, I & O's, Skin  REASON FOR ASSESSMENT:   Malnutrition Screening Tool, Low [place]    ASSESSMENT:   Jason [name] is a 30 y.o. 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 He is well known to our service from multiple prior admissions. He has hx of MDR pseudomonas and MRSA. He had recent prolonged admission 12/06/15 through 01/05/16 for worsening hypoxic respiratory failure due to HCAP. He was discharged home and reports that he had done well since discharge.  Spoke with pt's mother at bedside. Pt is well known to RD. She endorses pt doing nocturnal TF's @ home in addition to PO intake. According to Mom, pt was not eating up until 1 week ago due to nausea with eating.  Currently on PMV. Mom states currently he is a little confused but she expects him to eat PO during admission.  Will provide nocturnal TF for 12 hours similar to their routine at home.  Per RN, patient's CBG was 542. Labs: Na 128 Medications: Solumedrol, Insulin drip  Diet Order:  Diet heart healthy/carb modified Room service appropriate?: Yes; Fluid consistency:: Thin  Skin:   Stg IV to sacrum, Stg IV to ischial tuberosity, Stg III to heel  Last BM:  4/12  Height:   Ht Readings from Last 1 Encounters:  01/18/16 5\' 8"  (1.727 m)    Weight:    Wt Readings from Last 1 Encounters:  01/18/16 111 lb 15.9 oz (50.8 kg)    Ideal Body Weight:  70 kg  BMI:  Body mass index is 17.03 kg/(m^2).  Estimated Nutritional Needs:   Kcal:  1600-1800  Protein:  90-110 gm  Fluid:  >/= 1.7L  EDUCATION NEEDS:   No education needs identified at this time  Jason [name]. Jason Secrest, MS, RD LDN After Hours/Weekend Pager [phone]

## 2016-01-18 NOTE — Progress Notes (Signed)
PULMONARY / CRITICAL CARE MEDICINE   Name: Jason [name] MRN: [id number] DOB: [birth year]    ADMISSION DATE:  01/17/2016 CONSULTATION DATE:  01/17/16  REFERRING MD:  EDP  CHIEF COMPLAINT:  SOB  Brief Hx :  Jason [name] is a 30 y.o. 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 He had recent prolonged admission 12/06/15 through 01/05/16 for worsening hypoxic respiratory failure due to HCAP. He was discharged home and reports that he had done well since discharge. On 01/16/16, he spiked fever to 102 and mother reports that she suctioned thick yellow sputum from pt's trach. In ED, CXR was concerning for multifocal PNA. In addition, he was found to have UTI and anemia with Hgb 6.5. Mother reports that he has had a little blood from his sacral wounds and had a feeling that his Hgb would be on the low side. He was initially placed on full vent support in ED, but shortly after asked to come off so that he could eat. During my exam, he is resting comfortably, has PMV in and is eating dinner without problems. He denies any chest pain, SOB, N/V/D, abd pain, myalgias. He does have persistent back pain, unchanged from baseline. He is requesting to go home.  PAST MEDICAL HISTORY :  He  has a past medical history of GERD (gastroesophageal reflux disease); Asthma; MRSA infection; Gastroparesis; Diabetic neuropathy ([place]); Seizures ([place]); Family history of anesthesia complication; Dysrhythmia; Pneumonia; Arthritis; Fibromyalgia; Stroke ([place]) (01/29/2014); Type I diabetes mellitus ([place]); Syncope (02/16/2015); Multiple allergies (11/02/2015); and Recurrent UTI (11/02/2015).  SUBJECTIVE:  No complaints today. Says he feels same as when he  came in.  VITAL SIGNS: BP 99/63 mmHg  Pulse 91  Temp(Src) 97.4 F (36.3 C) (Axillary)  Resp 20  Ht 5\' 8"  (1.727 m)  Wt 111 lb 15.9 oz (50.8 kg)  BMI 17.03 kg/m2  SpO2 100%  HEMODYNAMICS:    VENTILATOR SETTINGS: Vent Mode:  [-] PRVC FiO2 (%):  [28 %-40 %] 28 % Set Rate:  [12 bmp] 12 bmp Vt Set:  [570 mL] 570 mL PEEP:  [5 cmH20] 5 cmH20 Plateau Pressure:  [24 cmH20] 24 cmH20  INTAKE / OUTPUT: I/O last 3 completed shifts: In: 1884.5 [I.V.:1252.5; Blood:302; Other:130; IV Piggyback:200] Out: 675 [Urine:600; Stool:75]  PHYSICAL EXAMINATION: General:  On trach collar, 5L of O2, chronically ill apperaing Neuro:  Alert, oriented, following commands HEENT:  Dry oral mucosa Cardiovascular:  RRR Lungs:  On high flow O2, trach collar Abdomen:  Soft, not tender, PEG tube, Colostomy and chronic Suprapubic cath in place, no abdominal tenderness Musculoskeletal:  Wearing unna boots bilat feet Skin:  No lesions.  LABS:  BMET  Recent Labs Lab 01/17/16 1645 01/18/16 0519  NA 128* 128*  K 4.8 5.1  CL 98* 98*  CO2 18* 11*  BUN 37* 48*  CREATININE 0.95 1.44*  GLUCOSE 377* 539*    Electrolytes  Recent Labs Lab 01/17/16 1645 01/18/16 0519  CALCIUM 8.9 8.9  MG  --  1.7  PHOS  --  6.3*    CBC  Recent Labs Lab 01/17/16 1645 01/18/16 0519  WBC 27.8* 29.7*  HGB 6.5* 7.4*  HCT 20.3* 23.5*  PLT 320 322    Coag's No results for input(s): APTT, INR  in the last 168 hours.  Sepsis Markers  Recent Labs Lab 01/17/16 1638 01/17/16 2307  LATICACIDVEN 0.99 1.12    ABG No results for input(s): PHART, PCO2ART, PO2ART in the last 168 hours.  Liver Enzymes  Recent Labs Lab 01/17/16 1645  AST 18  ALT 15*  ALKPHOS 125  BILITOT 0.5  ALBUMIN 2.2*    Cardiac Enzymes No results for input(s): TROPONINI, PROBNP in the last 168 hours.  Glucose No results for input(s): GLUCAP in the last 168 hours.  Imaging Dg Chest Portable 1 View  01/17/2016  CLINICAL  DATA:  Shortness of Breath EXAM: PORTABLE CHEST 1 VIEW COMPARISON:  December 29, 2015 FINDINGS: Tracheostomy catheter tip is 5.1 cm above the carina. No pneumothorax. There is focal airspace consolidation in the right mid lung as well as in both bases, likely multifocal pneumonia. Heart size is within normal limits. Pulmonary vascularity is normal. No adenopathy. There is evidence of old trauma with remodeling in the lateral right clavicle. IMPRESSION: Findings most likely due to multifocal pneumonia. Aspiration is a differential consideration. Tracheostomy as described without pneumothorax. Stable cardiac silhouette. Electronically Signed   By: Lowella Grip III M.D.   On: 01/17/2016 16:44   STUDIES:  Chest xray- 4/11- Findings most likely due to multifocal pneumonia. Aspiration is a differential consideration. Stable cardiac silhouette.  CULTURES: 4/11 >> B/c X2 >> 4/11>> Urine Cultures >> 4/11>> Respiratory Cultures>>  ANTIBIOTICS: 4/11 VAnc >> 4/11 cefepime>> 4/11 Fluconazole>>  SIGNIFICANT EVENTS: 4/11 Admission to ICU  LINES/TUBES: Trach PEG Suprapubic Cath Colostomy bag  DISCUSSION: 1 Y O chronically ill appearing paraplegic male on with chronic trach, presented to [facility] Ed with Fever, and change in sputum purulence and quantity. Chest xray findings suggestive of multifocal PNA, now on started on Broad spectrum antibiotics.   ASSESSMENT / PLAN:  PULMONARY A: Chronic Trach HCAP P:   Trach collar O2 sats >94% Pulmonary hygiene VAnc  cefepime  CARDIOVASCULAR A:  Hypotension- Chronic  P:  Home meds- Solucortef Cont stress does steroids IVF- increase rate to 125cc/hr with AKI  RENAL A:   Hyponatremia Hypotension AKI likely from prerenal, sepsis Dehydration Anion gap metabolic acidosis P:   Hydrate- increase IVF from 50 to 125cc/hr Cont home midorine  GASTROINTESTINAL A:   Colostomy P:   Tube feeds Pecid for GI ppx  HEMATOLOGIC A:    Anemia Leukocytosis P:  Transfused  Heparin for DVT ppx CBC am  INFECTIOUS A:   HCAP Candida UTI P:   Cont VAnc and Cefepime D/c Fluconazole- likely colonization F/u B/c, Trach aspirate and Urine cultures If not improving consider broadening coverage to include Anaerobes, due to possible aspiration  ENDOCRINE A:  Hyperglycemia DM1 P:   Restart home 15u of lantus at low dose- 8u Cont Sliding scale Cont stress dose steroids- Solumedrol 20mg  BID  NEUROLOGIC A:   Hx of seizures- not on meds P:   RASS goal: 0   FAMILY  - Updates: Family not present.  - Inter-disciplinary family meet or Palliative Care meeting due by:  day 7- 01/24/2016  Bing Neighbors, MD IMTS. 8:01 AM 01/18/2016  Rush Farmer, M.D. [facility] Pulmonary/Critical Care Medicine. Pager: [phone]. After hours pager: [phone].

## 2016-01-18 NOTE — Consult Note (Addendum)
WOC wound follow up:  Pt familiar to Jason [name] team from recent previous admission. He has multiple chronic wounds and mother is very well-informed regarding topical treatment.  Vac was previously applied to sacrum and bilat ischium wounds by home health; but had to be discontinued related to frequent loose stools from rectum which were soiling underneath the dressings and Vac was unable to maintain a seal. He can resume this plan of care after discharge home. 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 Pressure Ulcer POA: Yes x 4 Measurement: L foot plantar: full thickness  1.2cm x 1.2cm x 0.1cm, red and moist, scant amt blood tinged drainage, no odor L outer foot: full thickness .5cm x .5cm x 0.1cm, red and moist, scant amt blood tinged drainage, no odor Left heel: previous stage 3 healing pressure injury; .2X.2X.1cm, 100% red and moist, scant amt blood tinged drainage, no odor Left ischium: Stage 4 pressure injury 7.5cm x 8cm x 1cm with 1cm undermining from 4-12 o'clock beefy red with small amt pink drainage, no odor, bone palpable Right ischium: Stage 4 pressure injury: 8cm x 4cm x 1cm with 1 cm tunneling at 12:00 o'clock, beefy red with small amt pink drainage, no odor, bone palpable  Sacrum: Stage 4 pressure injury 12cm x 7cm x 1.0cm, beefy red, small amt pink drainage, no odor, bone palpable  Midback: Pink dry scar tissue from previous wound which has healed.  Dressing procedure/placement/frequency: Ordered air mattress to decrease pressure, and is wearing bilat Prevalon boots. Foam dressings to left foot and heel wounds to promote healing.Aquacel to left plantar foot, sacrum and bilat ischium to provide antimicrobial benefits and absorb drainage. Ostomy pouch intact with good seal, mod amt brown semiformed stool. Pouching supplies ordered to bedside for staff nurse use.  This consult took one hour to perform. Please re-consult if further assistance is  needed. Thank-you,  Jason Girt MSN, [place], [place], [place], [place]

## 2016-01-18 NOTE — Progress Notes (Signed)
Spoke to MD regarding patient.  Orders received to d/c ICU glycemic control orders and add DKA order set.  Note that patient is on PO diet.  Therefore per order set RN will need to cover CHO in meals with IV insulin/Glucostabilizer. Will follow.  Thanks, Adah Perl, RN, BC-ADM Inpatient Diabetes Coordinator Pager [phone] (8a-5p)

## 2016-01-18 NOTE — Progress Notes (Signed)
   01/18/16 1100  Clinical Encounter Type  Visited With Family  Visit Type Initial  Referral From Family  Spiritual Encounters  Spiritual Needs Literature  Mother requested AD/HCPOA for son, who is physically disabled. Patient not conscious while chaplain there, but mother asserts he is generally conscious and lucid. Mother said patient cannot sign name but said he would be able to make an X on the form. Left form with mother and explained that due to staff shortage today, we would notarize it tomorrow. She indicated that was fine. Zebulon Gantt, Chaplain

## 2016-01-18 NOTE — Care Management Note (Signed)
Case Management Note  Patient Details  Name: Jason [name] MRN: [id number] Date of Birth: July 26, 1986  Subjective/Objective:     Adm w pneumonia, has peg and trach               Action/Plan:lives w mother, act w [facility], gets eq w adult and pediatric services   Expected Discharge Date:                  Expected Discharge Plan:  [place]  In-House Referral:     Discharge planning Services  CM Consult  Post Acute Care Choice:    Choice offered to:     DME Arranged:    DME Agency:     HH Arranged:  RN [place]:  [place]  Status of Service:     Medicare Important Message Given:    Date Medicare IM Given:    Medicare IM give by:    Date Additional Medicare IM Given:    Additional Medicare Important Message give by:     If discussed at [place] of Stay Meetings, dates discussed:    Additional Comments: ur review done  Lacretia Leigh, RN 01/18/2016, 10:31 AM

## 2016-01-19 ENCOUNTER — Inpatient Hospital Stay (HOSPITAL_COMMUNITY): Payer: Medicaid Other

## 2016-01-19 DIAGNOSIS — I9589 Other hypotension: Secondary | ICD-10-CM

## 2016-01-19 DIAGNOSIS — K3184 Gastroparesis: Secondary | ICD-10-CM | POA: Diagnosis present

## 2016-01-19 DIAGNOSIS — A419 Sepsis, unspecified organism: Principal | ICD-10-CM

## 2016-01-19 DIAGNOSIS — J40 Bronchitis, not specified as acute or chronic: Secondary | ICD-10-CM | POA: Diagnosis present

## 2016-01-19 DIAGNOSIS — Z9911 Dependence on respirator [ventilator] status: Secondary | ICD-10-CM

## 2016-01-19 DIAGNOSIS — L8990 Pressure ulcer of unspecified site, unspecified stage: Secondary | ICD-10-CM | POA: Diagnosis present

## 2016-01-19 DIAGNOSIS — E871 Hypo-osmolality and hyponatremia: Secondary | ICD-10-CM

## 2016-01-19 DIAGNOSIS — E872 Acidosis: Secondary | ICD-10-CM

## 2016-01-19 DIAGNOSIS — B3749 Other urogenital candidiasis: Secondary | ICD-10-CM | POA: Diagnosis present

## 2016-01-19 LAB — BASIC METABOLIC PANEL
Anion gap: 11 (ref 5–15)
Anion gap: 14 (ref 5–15)
BUN: 55 mg/dL — AB (ref 6–20)
BUN: 46 mg/dL — ABNORMAL HIGH (ref 6–20)
CALCIUM: 9 mg/dL (ref 8.9–10.3)
CHLORIDE: 101 mmol/L (ref 101–111)
CO2: 16 mmol/L — AB (ref 22–32)
CO2: 19 mmol/L — AB (ref 22–32)
CREATININE: 1.19 mg/dL (ref 0.61–1.24)
CREATININE: 0.83 mg/dL (ref 0.61–1.24)
Calcium: 8.9 mg/dL (ref 8.9–10.3)
Chloride: 107 mmol/L (ref 101–111)
GFR calc Af Amer: >60 mL/min (ref >60)
GFR calc non Af Amer: >60 mL/min (ref >60)
GFR calc non Af Amer: >60 mL/min (ref >60)
Glucose, Bld: 173 mg/dL — ABNORMAL HIGH (ref 65–99)
Glucose, Bld: 96 mg/dL (ref 65–99)
Potassium: 4 mmol/L (ref 3.5–5.1)
Potassium: 4.4 mmol/L (ref 3.5–5.1)
SODIUM: 137 mmol/L (ref 135–145)
Sodium: 131 mmol/L — ABNORMAL LOW (ref 135–145)

## 2016-01-19 LAB — GLUCOSE, CAPILLARY
GLUCOSE-CAPILLARY: 116 mg/dL — AB (ref 65–99)
GLUCOSE-CAPILLARY: 118 mg/dL — AB (ref 65–99)
GLUCOSE-CAPILLARY: 133 mg/dL — AB (ref 65–99)
GLUCOSE-CAPILLARY: 139 mg/dL — AB (ref 65–99)
GLUCOSE-CAPILLARY: 156 mg/dL — AB (ref 65–99)
GLUCOSE-CAPILLARY: 160 mg/dL — AB (ref 65–99)
GLUCOSE-CAPILLARY: 193 mg/dL — AB (ref 65–99)
GLUCOSE-CAPILLARY: 78 mg/dL (ref 65–99)
GLUCOSE-CAPILLARY: 87 mg/dL (ref 65–99)
Glucose-Capillary: 152 mg/dL — ABNORMAL HIGH (ref 65–99)
Glucose-Capillary: 128 mg/dL — ABNORMAL HIGH (ref 65–99)
Glucose-Capillary: 125 mg/dL — ABNORMAL HIGH (ref 65–99)
Glucose-Capillary: 109 mg/dL — ABNORMAL HIGH (ref 65–99)
Glucose-Capillary: 110 mg/dL — ABNORMAL HIGH (ref 65–99)
Glucose-Capillary: 164 mg/dL — ABNORMAL HIGH (ref 65–99)
Glucose-Capillary: 194 mg/dL — ABNORMAL HIGH (ref 65–99)
Glucose-Capillary: 256 mg/dL — ABNORMAL HIGH (ref 65–99)
Glucose-Capillary: 217 mg/dL — ABNORMAL HIGH (ref 65–99)
Glucose-Capillary: 186 mg/dL — ABNORMAL HIGH (ref 65–99)
Glucose-Capillary: 149 mg/dL — ABNORMAL HIGH (ref 65–99)
Glucose-Capillary: 94 mg/dL (ref 65–99)

## 2016-01-19 LAB — LACTIC ACID, PLASMA
LACTIC ACID, VENOUS: 0.9 mmol/L (ref 0.5–2.0)
LACTIC ACID, VENOUS: 1.1 mmol/L (ref 0.5–2.0)

## 2016-01-19 LAB — COMPREHENSIVE METABOLIC PANEL
ALBUMIN: 2.3 g/dL — AB (ref 3.5–5.0)
ALK PHOS: 134 U/L — AB (ref 38–126)
ALT: 14 U/L — AB (ref 17–63)
ANION GAP: 12 (ref 5–15)
AST: 14 U/L — ABNORMAL LOW (ref 15–41)
BUN: 54 mg/dL — ABNORMAL HIGH (ref 6–20)
CALCIUM: 9 mg/dL (ref 8.9–10.3)
CHLORIDE: 104 mmol/L (ref 101–111)
CO2: 17 mmol/L — AB (ref 22–32)
CREATININE: 1.15 mg/dL (ref 0.61–1.24)
GFR calc Af Amer: >60 mL/min (ref >60)
GFR calc non Af Amer: >60 mL/min (ref >60)
GLUCOSE: 113 mg/dL — AB (ref 65–99)
Potassium: 4.1 mmol/L (ref 3.5–5.1)
SODIUM: 133 mmol/L — AB (ref 135–145)
Total Bilirubin: 0.3 mg/dL (ref 0.3–1.2)
Total Protein: 7.1 g/dL (ref 6.5–8.1)

## 2016-01-19 LAB — TYPE AND SCREEN
ABO/RH(D): O POS
ANTIBODY SCREEN: NEGATIVE
Unit division: 0

## 2016-01-19 LAB — URINE CULTURE: Special Requests: NORMAL

## 2016-01-19 LAB — POCT I-STAT 3, ART BLOOD GAS (G3+)
ACID-BASE DEFICIT: 6 mmol/L — AB (ref 0.0–2.0)
Bicarbonate: 19 mEq/L — ABNORMAL LOW (ref 20.0–24.0)
O2 SAT: 99 %
TCO2: 20 mmol/L (ref 0–100)
pCO2 arterial: 32.8 mmHg — ABNORMAL LOW (ref 35.0–45.0)
pH, Arterial: 7.369 (ref 7.350–7.450)
pO2, Arterial: 124 mmHg — ABNORMAL HIGH (ref 80.0–100.0)

## 2016-01-19 LAB — CBC
HCT: 25.5 % — ABNORMAL LOW (ref 39.0–52.0)
HEMOGLOBIN: 8 g/dL — AB (ref 13.0–17.0)
MCH: 25.8 pg — AB (ref 26.0–34.0)
MCHC: 31.4 g/dL (ref 30.0–36.0)
MCV: 82.3 fL (ref 78.0–100.0)
Platelets: 358 10*3/uL (ref 150–400)
RBC: 3.1 MIL/uL — AB (ref 4.22–5.81)
RDW: 17.9 % — ABNORMAL HIGH (ref 11.5–15.5)
WBC: 34.6 10*3/uL — ABNORMAL HIGH (ref 4.0–10.5)

## 2016-01-19 LAB — AMMONIA: AMMONIA: 49 umol/L — AB (ref 9–35)

## 2016-01-19 MED ORDER — INSULIN ASPART 100 UNIT/ML ~~LOC~~ SOLN: 2.0000 [IU] | SUBCUTANEOUS | Status: DC

## 2016-01-19 MED ORDER — INSULIN ASPART 100 UNIT/ML ~~LOC~~ SOLN
2.0000 [IU] | SUBCUTANEOUS | Status: DC
  Administered 2016-01-20 – 2016-01-22 (×14): 2 [IU] via SUBCUTANEOUS

## 2016-01-19 MED ORDER — INSULIN ASPART 100 UNIT/ML ~~LOC~~ SOLN
0.0000 [IU] | SUBCUTANEOUS | Status: DC
  Administered 2016-01-20 (×2): 7 [IU] via SUBCUTANEOUS
  Administered 2016-01-20: 5 [IU] via SUBCUTANEOUS
  Administered 2016-01-20: 7 [IU] via SUBCUTANEOUS
  Administered 2016-01-20: 5 [IU] via SUBCUTANEOUS
  Administered 2016-01-20 – 2016-01-21 (×2): 7 [IU] via SUBCUTANEOUS
  Administered 2016-01-21: 5 [IU] via SUBCUTANEOUS
  Administered 2016-01-21: 9 [IU] via SUBCUTANEOUS
  Administered 2016-01-21: 3 [IU] via SUBCUTANEOUS

## 2016-01-19 MED ORDER — INSULIN GLARGINE 100 UNIT/ML ~~LOC~~ SOLN
5.0000 [IU] | Freq: Once | SUBCUTANEOUS | Status: AC
  Administered 2016-01-19: 5 [IU] via SUBCUTANEOUS
  Filled 2016-01-19: qty 0.05

## 2016-01-19 MED ORDER — ACETONE (URINE) TEST VI STRP
1.0000 | ORAL_STRIP | Status: DC | PRN
  Filled 2016-01-19: qty 1

## 2016-01-19 NOTE — Progress Notes (Signed)
Patient is still obtunded, with no response to painful stimulation. Patient will not respond to verbal commands and will not open eyes. Patient blood sugar at this time is 87 on insulin gtt. Contacted Elink MD. Warren Lacy MD informed of blood sugar and reported for RN to stop insulin drip for 1 hour and then take sugar again. Also informed ELink MD of patient's behavior and latest ABGs. Elink MD informed RN that he would like patient transferred to ICU due to the fact that patient is getting worse and not better. Patient's family at bedside and informed RN that "this behavior is due to the antibiotics. When he is off the antibiotics for a day, he will come around. He should come around tomorrow and in the afternoon he will talking to you." Mother at bedside also informed RN that she is frustrated because "these doctors do not know her son." She would like the Infectious disease doctors to come see him since they "know everything about him." RN addressed concerns of patient and discussed that we would like to monitor patient more closely for the night. Also RN at this time offered mother the opportunity to talk with Elink MD. Patient's family member denied at this time. Will continue to monitor patient and will inform ELink MD of any changes. Gildardo Cranker, RN

## 2016-01-19 NOTE — Progress Notes (Signed)
Name: Jason [name] MRN: [id number] DOB: January 31, 1986    ADMISSION DATE:  01/17/2016 CONSULTATION DATE:  01/17/16  REFERRING MD :  EDP  CHIEF COMPLAINT:  SOB   HISTORY OF PRESENT ILLNESS:  Jason [name] is a 30 y.o. 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  He is well known to our service from multiple prior admissions.  He has hx of MDR pseudomonas and MRSA.  He had recent prolonged admission 12/06/15 through 01/05/16 for worsening hypoxic respiratory failure due to HCAP.  He was discharged home and reports that he had done well since discharge. On 01/16/16, he spiked fever to 102 and mother reports that she suctioned thick yellow sputum from pt's trach.  On 01/17/16, temp dropped to 101F but pt still had thick sputum along with mild SOB.  Due to concern for developing PNA, his mother thought it would be best to bring him to ED for further evaluation.  In ED, CXR was concerning for multifocal PNA.  In addition, he was found to have UTI and anemia with Hgb 6.5.  Mother reports that he has had a little blood from his sacral wounds and had a feeling that his Hgb would be on the low side.  He was initially placed on full vent support in ED, but shortly after asked to come off so that he could eat.  During my exam, he is resting comfortably, has PMV in and is eating dinner without problems.  He denies any chest pain, SOB, N/V/D, abd pain, myalgias.  He does have persistent back pain, unchanged from baseline.  He is requesting to go home.  SUBJECTIVE:  Acute resp acidosis overnight, evidently happens every time he get any antibiotics and was placed back on the vent.  VITAL SIGNS: Temp:  [93.3 F (34.1 C)-99.7 F (37.6 C)] 98 F (36.7 C) (04/13 0800) Pulse Rate:  [45-117] 80 (04/13 1000) Resp:  [8-28] 12 (04/13 1000) BP: (98-154)/(61-103) 98/75 mmHg (04/13 1000) SpO2:  [89  %-100 %] 100 % (04/13 1000) FiO2 (%):  [28 %-40 %] 40 % (04/13 0800) Weight:  [49.896 kg (110 lb)] 49.896 kg (110 lb) (04/13 0500)  PHYSICAL EXAMINATION: General: Young adult AA male, lethargic but arousble on the vent. Neuro: A&O x 3, non-focal.  HEENT: [place]/AT. PERRL, sclerae anicteric. Trach site clean. Cardiovascular: RRR, no M/R/G.  Lungs: Respirations even and unlabored.  Coarse bilateral bases R > L. Abdomen: BS x 4, soft, NT/ND.  Musculoskeletal: Muscle wasting throughout, bilateral ankles in pressure relief boots. Skin: Intact, warm, no rashes.  Recent Labs Lab 01/18/16 2055 01/19/16 0027 01/19/16 0246  NA 130* 131* 133*  K 4.8 4.4 4.1  CL 101 101 104  CO2 17* 16* 17*  BUN 56* 55* 54*  CREATININE 1.36* 1.19 1.15  GLUCOSE 143* 96 113*    Recent Labs Lab 01/17/16 1645 01/18/16 0519 01/19/16 0246  HGB 6.5* 7.4* 8.0*  HCT 20.3* 23.5* 25.5*  WBC 27.8* 29.7* 34.6*  PLT 320 322 358   Dg Chest Port 1 View  01/19/2016  CLINICAL DATA:  J96.00 (ICD-10-CM) - Acute respiratory failure ([name]) Chronic Tracheostomy, fever, leukocytosis EXAM: PORTABLE CHEST 1 VIEW COMPARISON:  01/18/2016 FINDINGS: Irregular mid to lower lung interstitial and hazy airspace opacities, more confluent at the bases, have mildly increased from the prior study, which may be due to an increase in small bilateral pleural effusions. No pneumothorax. Tracheostomy  tube is well positioned and stable. IMPRESSION: 1. Mild worsening of lung aeration at the lung bases. Suspect that increases and small bilateral pleural effusions since the previous day's study. 2. Persistent bilateral mid to lower lung zone interstitial and airspace opacities consistent with pneumonia. Electronically Signed   By: Lajean Manes M.D.   On: 01/19/2016 08:52   Dg Chest Port 1 View  01/18/2016  CLINICAL DATA:  Respiratory failure. EXAM: PORTABLE CHEST 1 VIEW COMPARISON:  No prior . FINDINGS: Tracheostomy tube noted in good anatomic position.  Stable cardiomegaly. Normal pulmonary vascularity. Stable diffuse bilateral nodular pulmonary infiltrates, no interim clearing. Low lung volumes. No pleural effusion or pneumothorax. IMPRESSION: 1.  Tracheostomy tube in stable position. 2. Persistent unchanged diffuse prominent nodular bilateral pulmonary infiltrates. Electronically Signed   By: Marcello Moores  Register   On: 01/18/2016 07:22   Dg Chest Portable 1 View  01/17/2016  CLINICAL DATA:  Shortness of Breath EXAM: PORTABLE CHEST 1 VIEW COMPARISON:  December 29, 2015 FINDINGS: Tracheostomy catheter tip is 5.1 cm above the carina. No pneumothorax. There is focal airspace consolidation in the right mid lung as well as in both bases, likely multifocal pneumonia. Heart size is within normal limits. Pulmonary vascularity is normal. No adenopathy. There is evidence of old trauma with remodeling in the lateral right clavicle. IMPRESSION: Findings most likely due to multifocal pneumonia. Aspiration is a differential consideration. Tracheostomy as described without pneumothorax. Stable cardiac silhouette. Electronically Signed   By: Lowella Grip III M.D.   On: 01/17/2016 16:44   STUDIES:  CXR 04/11 > multifocal PNA.  CULTURES: Blood 04/11 >NTD Urine 04/11 >NTD Sputum 04/11 >NTD  ANTIBIOTICS: Vanc 04/11 > Cefepime 04/11 > Diflucan 04/11 >  SIGNIFICANT EVENTS  04/11 > admitted with HCAP.  LINES / TUBES: Chronic trach / PEG >  I reviewed CXR myself, trach in good position, infiltrate noted.  ASSESSMENT / PLAN:  Chronic respiratory failure - s/p tracheostomy.  Has tolerated PMV most of the day, only required vent support during wound care. HCAP. Tracheostomy status. Hx MDR pseudomonas and MRSA PNA. Plan: Maintain on vent while on abx for now given last nights events. Usually on vent 2 hours out of every 8 at home. Abx/cultures as below. Continue outpatient albuterol. Pulmonary hygiene. CXR in AM.  HCAP with probable tracheitis. UTI -  also with yeast. Plan: Continue empiric abx (vanc/cefepime/diflucan). Follow cultures. Routine wound care with santyl dressings, defer to wound care.  Chronic hypotension - on midodrine and was on solucortef last admission which was gradually being weaned following discharge. Plan: Continue outpatient midodrine, cortef. Monitor hemodynamics.  Hyponatremia - chronic. Plan: KVO IVF Correct electrolytes as indicated. BMP in AM.  AoC anemia - per mother, pt has had some mild blood in his sacral wounds. VTE prophylaxis. Plan: Heparin SQ. CBC in AM.  Hx GERD, gastroparesis. Nutrition. Plan: Continue outpatient PPI. Carb mod / heart healthy diet but now while on the vent.  DM. Plan: Continue SSI. Restart lantus at home dose.  Hx chronic pain, diabetic neuropathy, fibromyalgia, anxiety, paraplegia, seizures (not on meds). Plan: Continue outpatient fentanyl patch, baclofen, bupropion, lorazepam, lyrica, oxycodone.  Family updates:  Mother updated bedside by me.  Will hold in the SDU and transfer care to [facility] with PCCM as consult for vent management.  Discussed with RT and bedside RN.  Rush Farmer, M.D. [facility] Pulmonary/Critical Care Medicine. Pager: [phone]. After hours pager: [phone].

## 2016-01-19 NOTE — Progress Notes (Signed)
[place] TEAM 1 - Stepdown/ICU TEAM Progress Note  Jason [name] [id number] DOB: 04-24-1986 DOA: 01/17/2016 PCP: [name], MD  Admit HPI / Brief Narrative: Jason [name] is a 30 y.o. 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 He is well known to our service from multiple prior admissions.Hx  MDR Pseudomonas and MRSA.Gastroparesis;Type I diabetes mellitus ;Diabetic neuropathy ; Dysrhythmia; Arthritis; Multiple allergies (11/02/2015); and Recurrent UTI (11/02/2015).   He had recent prolonged admission 12/06/15 through 01/05/16 for worsening hypoxic respiratory failure due to HCAP. He was discharged home and reports that he had done well since discharge. On 01/16/16, he spiked fever to 102 and mother reports that she suctioned thick yellow sputum from pt's trach. On 01/17/16, temp dropped to 101F but pt still had thick sputum along with mild SOB. Due to concern for developing PNA, his mother thought it would be best to bring him to ED for further evaluation.  In ED, CXR was concerning for multifocal PNA. In addition, he was found to have UTI and anemia with Hgb 6.5. Mother reports that he has had a little blood from his sacral wounds and had a feeling that his Hgb would be on the low side.  He was initially placed on full vent support in ED, but shortly after asked to come off so that he could eat. During my exam, he is resting comfortably, has PMV in and is eating dinner without problems. He denies any chest pain, SOB, N/V/D, abd pain, myalgias. He does have persistent back pain, unchanged from baseline. He is requesting to go home.  HPI/Subjective: 4/13 patient somewhat sleepy but arousable, nods yes and no to questions. Able to indicate where he is having pain (left Buttock)  Assessment/Plan: Acute on Chronic respiratory failure  -  s/p tracheostomy.  -Has tolerated PMV most of the day, only required vent support during wound care which is a decline in his respiratory status.  HCAP w/ Probable Tracheobronchitis:  -Tracheal aspirate thick purulent greenish yellow color consistent with Pseudomonas. Cultures pending.Hx MDR pseudomonas and MRSA PNA. -Continue Empiric Vancomycin, Cefepime,  Awaiting repeat cultures -Continue PMV as tolerated. -Full vent support as needed only. -Abx / cultures as below. -Albuterol. QID -Physiotherapy vest BID -NTS  BID. -CXR in AM.  Funguria/Yeast UTI: -True  infection vs colonization?  -Continue empiric abx (vanc / cefepime / diflucan).  Multiple decubitus ulcers -Could use 2-3 days hydrotherapy, then wound VAC  Chronic hypotension  - Midodrine 20 mg TID -Continue Solucortef 10 mg  BID will continue to try titrate down (is patient receiving?)  Hyponatremia. -Resolved   Acute blood loss anemia? vs Anemia Chronic disease  - 4/12 Transfuse 1u PRBC.    Gastroparesis.  Nutrition. -Glucerna 1.2 @ 159ml/hr -Pre-albumin ending  DM. -Lantus 5 units continue glucose stabilizer for 2 hours after administering and then discontinue -Sensitive SSI. -NovoLog 2 units q 4hrs to cover tube feedings     Family updates: Mother and Father at bedside.  Code Status: FULL Family Communication: Parents present at time of exam Disposition Plan: Resolution sepsis; need better discharge plan as patient is in hospital q month    Consultants: [facility]  Procedure/Significant Events:    Culture   Antibiotics: Cefepime Vancomycin   DVT prophylaxis: Subcutaneous heparin   Devices Tracheostomy in place   LINES / TUBES:  NA    Continuous Infusions: . sodium chloride Stopped (  01/19/16 0800)  . dextrose 5 % and 0.45% NaCl 20 mL/hr at 01/19/16 0744  . feeding supplement (GLUCERNA 1.2 CAL) 1,000 mL (01/19/16 1900)  . insulin (NOVOLIN-R) infusion 0.7 Units/hr (01/19/16  1900)    Objective: VITAL SIGNS: Temp: 98.5 F (36.9 C) (04/13 1939) Temp Source: Oral (04/13 1939) BP: 146/90 mmHg (04/13 2000) Pulse Rate: 83 (04/13 2000) SPO2; FIO2:   Intake/Output Summary (Last 24 hours) at 01/19/16 2035 Last data filed at 01/19/16 1900  Gross per 24 hour  Intake 2673.16 ml  Output   3600 ml  Net -926.84 ml     Exam: General: Sleepy, but arousable, nods yes/no to questions, positive acute respiratory distress (unable to tolerate PMV) Eyes: negative scleral hemorrhage ENT: Negative Runny nose, negative gingival bleeding, Neck:  Negative scars, masses, torticollis, lymphadenopathy, JVD Lungs: diminished breath sounds bilateral, negative wheezes or crackles Cardiovascular: Regular rate and rhythm without murmur gallop or rub normal S1 and S2 Abdomen:negative abdominal pain, nondistended, positive soft, bowel sounds, no rebound, no ascites, no appreciable mass, ostomy bag in place LUQ with stool Skin; 3 large decubitus ulcers bilateral hip and sacrum to the bone, with some dead tissue visible. Extremities: No significant cyanosis, clubbing, or edema bilateral lower extremities, bilateral foot lacerations in various states of healing Psychiatric:  Negative depression, negative anxiety, negative fatigue, negative mania Neurologic:  Cranial nerves II through XII intact, tongue/uvula midline, quadriplegic    Data Reviewed: Basic Metabolic Panel:  Recent Labs Lab 01/18/16 0519  01/18/16 1625 01/18/16 2055 01/19/16 0027 01/19/16 0246 01/19/16 1455  NA 128*  < > 129* 130* 131* 133* 137  K 5.1  < > 4.7 4.8 4.4 4.1 4.0  CL 98*  < > 100* 101 101 104 107  CO2 11*  < > 15* 17* 16* 17* 19*  GLUCOSE 539*  < > 357* 143* 96 113* 173*  BUN 48*  < > 57* 56* 55* 54* 46*  CREATININE 1.44*  < > 1.42* 1.36* 1.19 1.15 0.83  CALCIUM 8.9  < > 9.0 9.1 8.9 9.0 9.0  MG 1.7  --   --   --   --   --   --   PHOS 6.3*  --   --   --   --   --   --   < > = values in this  interval not displayed. Liver Function Tests:  Recent Labs Lab 01/17/16 1645 01/19/16 0246  AST 18 14*  ALT 15* 14*  ALKPHOS 125 134*  BILITOT 0.5 0.3  PROT 6.6 7.1  ALBUMIN 2.2* 2.3*   No results for input(s): LIPASE, AMYLASE in the last 168 hours.  Recent Labs Lab 01/19/16 0246  AMMONIA 49*   CBC:  Recent Labs Lab 01/17/16 1645 01/18/16 0519 01/19/16 0246  WBC 27.8* 29.7* 34.6*  NEUTROABS 25.6*  --   --   HGB 6.5* 7.4* 8.0*  HCT 20.3* 23.5* 25.5*  MCV 84.9 86.4 82.3  PLT 320 322 358   Cardiac Enzymes: No results for input(s): CKTOTAL, CKMB, CKMBINDEX, TROPONINI in the last 168 hours. BNP (last 3 results) No results for input(s): BNP in the last 8760 hours.  ProBNP (last 3 results) No results for input(s): PROBNP in the last 8760 hours.  CBG:  Recent Labs Lab 01/19/16 1409 01/19/16 1522 01/19/16 1555 01/19/16 1702 01/19/16 1828  GLUCAP 186* 160* 149* 94 78    Recent Results (from the past 240 hour(s))  Blood culture (routine x 2)  Status: None (Preliminary result)   Collection Time: 01/17/16  5:00 PM  Result Value Ref Range Status   Specimen Description BLOOD RIGHT FOREARM  Final   Special Requests BOTTLES DRAWN AEROBIC AND ANAEROBIC 5CC  Final   Culture NO GROWTH 2 DAYS  Final   Report Status PENDING  Incomplete  Blood culture (routine x 2)     Status: None (Preliminary result)   Collection Time: 01/17/16  5:15 PM  Result Value Ref Range Status   Specimen Description BLOOD LEFT ANTECUBITAL  Final   Special Requests IN PEDIATRIC BOTTLE 1CC  Final   Culture NO GROWTH 2 DAYS  Final   Report Status PENDING  Incomplete  Urine culture     Status: None   Collection Time: 01/17/16  7:32 PM  Result Value Ref Range Status   Specimen Description URINE, CATHETERIZED  Final   Special Requests Normal  Final   Culture MULTIPLE SPECIES PRESENT, SUGGEST RECOLLECTION  Final   Report Status 01/19/2016 FINAL  Final  MRSA PCR Screening     Status: Abnormal    Collection Time: 01/17/16 11:50 PM  Result Value Ref Range Status   MRSA by PCR POSITIVE (A) NEGATIVE Final    Comment:        The GeneXpert MRSA Assay (FDA approved for NASAL specimens only), is one component of a comprehensive MRSA colonization surveillance program. It is not intended to diagnose MRSA infection nor to guide or monitor treatment for MRSA infections. RESULT CALLED TO, READ BACK BY AND VERIFIED WITH: MELVIN,RN ON 2H @0311  01/18/16 MKELLYM      Studies:  Recent x-ray studies have been reviewed in detail by the Attending Physician  Scheduled Meds:  Scheduled Meds: . albuterol  2.5 mg Nebulization Q6H  . antiseptic oral rinse  7 mL Mouth Rinse q12n4p  . baclofen  20 mg Oral QID  . buPROPion  150 mg Oral Daily  . ceFEPime (MAXIPIME) IV  1 g Intravenous Q12H  . chlorhexidine  15 mL Mouth Rinse BID  . Chlorhexidine Gluconate Cloth  6 each Topical Q0600  . docusate  100 mg Oral BID  . famotidine (PEPCID) IV  20 mg Intravenous Q12H  . fentaNYL  50 mcg Transdermal Q72H  . ferrous sulfate  325 mg Oral Q breakfast  . heparin  5,000 Units Subcutaneous 3 times per day  . hydrocortisone  10 mg Oral BID  . insulin aspart  0-9 Units Subcutaneous 6 times per day  . insulin aspart  2 Units Subcutaneous Q4H  . loratadine  10 mg Oral Daily  . magnesium oxide  400 mg Oral BID  . methylPREDNISolone (SOLU-MEDROL) injection  20 mg Intravenous Q12H  . midodrine  20 mg Oral TID WC  . mupirocin ointment  1 application Nasal BID  . pneumococcal 23 valent vaccine  0.5 mL Intramuscular Tomorrow-1000  . pregabalin  100 mg Per Tube TID  . vancomycin  500 mg Intravenous Q24H    Time spent on care of this patient: 40 mins   Deandrea Vanpelt, Geraldo Docker , MD  [facility] Office  [phone] Pager [phone]  On-Call/Text Page:      [email or web address]      password [email or web address]  If 7PM-7AM, please contact night-coverage [email or web address] Password [email or web address] 01/19/2016, 8:35 PM   LOS: 2 days    Care during the described time interval was provided by me .  I have reviewed this patient's available data, including medical history, events of note, physical examination, and all  test results as part of my evaluation. I have personally reviewed and interpreted all radiology studies.   Dia Crawford, MD [phone] Pager

## 2016-01-19 NOTE — Progress Notes (Addendum)
[facility] Progress Note Patient Name: Jason [name] DOB: [birth year] MRN: [id number]   Date of Service  01/19/2016  HPI/Events of Note  Multiple issues: 1. Blood glucose = 87, 2. Patient remains profoundly obtunded. 3. ABG =  7.212/43.2/103.0. Anion Gap = 16 >> 12.    Will order: 1. Transfer to ICU. 2. Urine ketones now. 3. Lactic Acid level now.  4. Hold Insulin IV infusion for 1 hour and restart.  5. Will have PA evaluate the patient at bedside.      Intervention Category Major Interventions: Acid-Base disturbance - evaluation and management  Zeidy Tayag Eugene 01/19/2016, 12:20 AM

## 2016-01-19 NOTE — Progress Notes (Signed)
Contacted ELink MD to inform of patient's most recent ABG and patient is still obtunded. PH: 7.21, CO2:43.2, O2:103, Bicarb:17.3. Elink MD wants to continue monitoring patient at this time in the step down unit. Will continue to monitor patient. Gildardo Cranker, RN

## 2016-01-19 NOTE — Clinical Documentation Improvement (Signed)
Hospitalist  Can the diagnosis of Sepsis be further specified? Please specify in the progress notes if Sepsis was present on admission.    Sepsis - specify causative organism if known  Determine if there is Severe Sepsis (Sepsis with organ dysfunction - specify), Septic Shock if present  Identify etiology of Sepsis - Device, Implant, Graft, Infusion, Abortion  Specify organ dysfunction - Respiratory Failure, Encephalopathy, Acute Kidney Failure, Pneumonia, UTI, Other (specify), Unable to Clinically Determine}  Other  Clinically Undetermined  Document any associated diagnoses/conditions.   Supporting Information: Sepsis per 4/12 ED provider notes. Candida UTI per 4/12 progress notes.   Please exercise your independent, professional judgment when responding. A specific answer is not anticipated or expected.   Thank You,  [place] [phone]

## 2016-01-20 ENCOUNTER — Other Ambulatory Visit: Payer: Self-pay | Admitting: Endocrinology

## 2016-01-20 ENCOUNTER — Inpatient Hospital Stay (HOSPITAL_COMMUNITY): Payer: Medicaid Other

## 2016-01-20 DIAGNOSIS — Y95 Nosocomial condition: Secondary | ICD-10-CM

## 2016-01-20 DIAGNOSIS — J189 Pneumonia, unspecified organism: Secondary | ICD-10-CM | POA: Diagnosis present

## 2016-01-20 DIAGNOSIS — J9601 Acute respiratory failure with hypoxia: Secondary | ICD-10-CM | POA: Diagnosis present

## 2016-01-20 LAB — COMPREHENSIVE METABOLIC PANEL
ALK PHOS: 119 U/L (ref 38–126)
ALT: 14 U/L — AB (ref 17–63)
ANION GAP: 11 (ref 5–15)
AST: 14 U/L — ABNORMAL LOW (ref 15–41)
Albumin: 2.2 g/dL — ABNORMAL LOW (ref 3.5–5.0)
BILIRUBIN TOTAL: 0.4 mg/dL (ref 0.3–1.2)
BUN: 42 mg/dL — ABNORMAL HIGH (ref 6–20)
CALCIUM: 9.3 mg/dL (ref 8.9–10.3)
CO2: 20 mmol/L — ABNORMAL LOW (ref 22–32)
CREATININE: 0.82 mg/dL (ref 0.61–1.24)
Chloride: 106 mmol/L (ref 101–111)
Glucose, Bld: 318 mg/dL — ABNORMAL HIGH (ref 65–99)
Potassium: 4.9 mmol/L (ref 3.5–5.1)
Sodium: 137 mmol/L (ref 135–145)
TOTAL PROTEIN: 7.1 g/dL (ref 6.5–8.1)

## 2016-01-20 LAB — MAGNESIUM: MAGNESIUM: 1.8 mg/dL (ref 1.7–2.4)

## 2016-01-20 LAB — CBC WITH DIFFERENTIAL/PLATELET
Basophils Absolute: 0 10*3/uL (ref 0.0–0.1)
Basophils Relative: 0 %
Eosinophils Absolute: 0 10*3/uL (ref 0.0–0.7)
Eosinophils Relative: 0 %
HCT: 26.7 % — ABNORMAL LOW (ref 39.0–52.0)
HEMOGLOBIN: 8.5 g/dL — AB (ref 13.0–17.0)
LYMPHS ABS: 0.8 10*3/uL (ref 0.7–4.0)
LYMPHS PCT: 3 %
MCH: 26.2 pg (ref 26.0–34.0)
MCHC: 31.8 g/dL (ref 30.0–36.0)
MCV: 82.4 fL (ref 78.0–100.0)
MONOS PCT: 6 %
Monocytes Absolute: 1.3 10*3/uL — ABNORMAL HIGH (ref 0.1–1.0)
NEUTROS PCT: 91 %
Neutro Abs: 21.6 10*3/uL — ABNORMAL HIGH (ref 1.7–7.7)
Platelets: 406 10*3/uL — ABNORMAL HIGH (ref 150–400)
RBC: 3.24 MIL/uL — AB (ref 4.22–5.81)
RDW: 18.5 % — ABNORMAL HIGH (ref 11.5–15.5)
WBC: 23.7 10*3/uL — AB (ref 4.0–10.5)

## 2016-01-20 LAB — GLUCOSE, CAPILLARY
GLUCOSE-CAPILLARY: 124 mg/dL — AB (ref 65–99)
GLUCOSE-CAPILLARY: 182 mg/dL — AB (ref 65–99)
GLUCOSE-CAPILLARY: 264 mg/dL — AB (ref 65–99)
GLUCOSE-CAPILLARY: 275 mg/dL — AB (ref 65–99)
GLUCOSE-CAPILLARY: 308 mg/dL — AB (ref 65–99)
GLUCOSE-CAPILLARY: 309 mg/dL — AB (ref 65–99)
Glucose-Capillary: 93 mg/dL (ref 65–99)
Glucose-Capillary: 176 mg/dL — ABNORMAL HIGH (ref 65–99)
Glucose-Capillary: 304 mg/dL — ABNORMAL HIGH (ref 65–99)
Glucose-Capillary: 331 mg/dL — ABNORMAL HIGH (ref 65–99)

## 2016-01-20 LAB — LACTIC ACID, PLASMA: Lactic Acid, Venous: 2 mmol/L (ref 0.5–2.0)

## 2016-01-20 LAB — PHOSPHORUS: PHOSPHORUS: 2.7 mg/dL (ref 2.5–4.6)

## 2016-01-20 LAB — PREALBUMIN: Prealbumin: 10.3 mg/dL — ABNORMAL LOW (ref 18–38)

## 2016-01-20 MED ORDER — ALBUTEROL SULFATE (2.5 MG/3ML) 0.083% IN NEBU
2.5000 mg | INHALATION_SOLUTION | Freq: Four times a day (QID) | RESPIRATORY_TRACT | Status: DC | PRN
  Administered 2016-01-21 – 2016-01-22 (×2): 2.5 mg via RESPIRATORY_TRACT
  Filled 2016-01-20 (×2): qty 3

## 2016-01-20 MED ORDER — INSULIN GLARGINE 100 UNIT/ML ~~LOC~~ SOLN
15.0000 [IU] | Freq: Every day | SUBCUTANEOUS | Status: DC
  Administered 2016-01-20 – 2016-01-21 (×2): 15 [IU] via SUBCUTANEOUS
  Filled 2016-01-20 (×2): qty 0.15

## 2016-01-20 MED ORDER — ACETAMINOPHEN 325 MG PO TABS
650.0000 mg | ORAL_TABLET | Freq: Four times a day (QID) | ORAL | Status: DC | PRN
  Administered 2016-01-20 – 2016-01-26 (×4): 650 mg via ORAL
  Filled 2016-01-20 (×4): qty 2

## 2016-01-20 NOTE — Progress Notes (Signed)
PT refused chest PT

## 2016-01-20 NOTE — Progress Notes (Signed)
Inpatient Diabetes Program Recommendations  AACE/ADA: New Consensus Statement on Inpatient Glycemic Control (2015)  Target Ranges:  Prepandial:   less than 140 mg/dL      Peak postprandial:   less than 180 mg/dL (1-2 hours)      Critically ill patients:  140 - 180 mg/dL   Review of Glycemic Control Results for OBDULIO, CROM (MRN [id number]) as of 01/20/2016 10:21  Ref. Range 01/19/2016 23:54 01/20/2016 03:34 01/20/2016 08:22  Glucose-Capillary Latest Ref Range: 65-99 mg/dL 275 (H) 308 (H) 309 (H)    Inpatient Diabetes Program Recommendations:  Insulin - Basal: please add home dose Lantus 15 units   Text page sent to Dr. Sherral Hammers with above recommendation. Thank you  Raoul Pitch BSN, RN,CDE Inpatient Diabetes Coordinator [phone] (team pager)

## 2016-01-20 NOTE — Progress Notes (Signed)
Pharmacy Antibiotic Note  Jason [name] is a 30 y.o. male admitted on 01/17/2016 with pneumonia.  Pharmacy has been consulted for vancomycin, fluconazole and cefepime dosing. 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 He was recently on vancomycin and cefepime for a MRSA and pseudomonas pneumonia. He required pre-treatment with solu-medrol and famotidine prior to cefepime in the past due to cefuroxime allergy. Due to paraplegia, renal fxn is difficult to estimate. Scr is slightly above baseline.   Continue on Vancomycin and Cefepime -- now day # 4 Cultures negative and afebrile Scr = 0.82  Plan: - Continue vancomycin  500mg  IV Q24H - Continue cefepime 1 gram iv Q 24 hours - Vancomycin trough over the weekend if continues   - Pre-medicate cefepime with solu-medrol 20mg  and famotidine 20mg  - consider DC if pt continue to tolerate cefepime  Height: 5\' 8"  (172.7 cm) Weight: 103 lb (46.72 kg) IBW/kg (Calculated) : 68.4  Temp (24hrs), Avg:98.2 F (36.8 C), Min:97.4 F (36.3 C), Max:99.3 F (37.4 C)   Recent Labs Lab 01/17/16 1638  01/17/16 1645 01/17/16 2307 01/18/16 0519  01/18/16 2055 01/19/16 0027 01/19/16 0032 01/19/16 0246 01/19/16 1017 01/19/16 1455 01/20/16 0318  WBC  --   --  27.8*  --  29.7*  --   --   --   --  34.6*  --   --  23.7*  CREATININE  --   < > 0.95  --  1.44*  < > 1.36* 1.19  --  1.15  --  0.83 0.82  LATICACIDVEN 0.99  --   --  1.12  --   --   --   --  1.1  --  0.9  --  2.0  < > = values in this interval not displayed.  Estimated Creatinine Clearance: 86.2 mL/min (by C-G formula based on Cr of 0.82).    Allergies  Allergen Reactions  . Cefuroxime Axetil Anaphylaxis    Tolerated cefepime 12/2015 - with Pepcid/Solu-Medrol  . Ertapenem Other (See Comments)    Rash and confusion-->tolerated Imipenem   . Morphine And Related Other (See Comments)    Changed mental status, confusion, headache, visual hallucination   . Penicillins Anaphylaxis and Other (See Comments)    Tolerated Imipenem; no reaction to 7 day course of amoxicillin in 2015 Has patient had a PCN reaction causing immediate rash, facial/tongue/throat swelling, SOB or lightheadedness with hypotension: Yes Has patient had a PCN reaction causing severe rash involving mucus membranes or skin necrosis: No Has patient had a PCN reaction that required hospitalization Yes Has patient had a PCN reaction occurring within the last 10 years: No If all of the above answers are "NO", then may proceed with Cephalo  . Sulfa Antibiotics Anaphylaxis, Shortness Of Breath and Other (See Comments)  . Tessalon [Benzonatate] Anaphylaxis  . Shellfish Allergy Itching and Other (See Comments)    Took benadryl to alleviate reaction  . Levaquin [Levofloxacin In D5w] Swelling    Hand and forearm swelling  . Nsaids Itching and Other (See Comments)    Risk of bleeding, itching  . Miripirium Rash and Other (See Comments)    Change in mental status    Antimicrobials this admission: Vanc 4/11>> Cefepime 4/11>>  Dose adjustments this admission: N/A  Microbiology results: Pending - negative to date  Thank you Anette Guarneri, PharmD [phone]  01/20/2016 9:28 AM

## 2016-01-20 NOTE — Progress Notes (Signed)
Patient requested to go on trach collar. RT deflated cuff and  placed patient on ATC with 40% Fio2.  Patient is resting comfortably with speaking valve on trach. Patient's current O2 sat is 100%. RT will continue to monitor as needed.

## 2016-01-20 NOTE — Progress Notes (Signed)
Physical Therapy Wound Treatment and Discharge  Patient Details  Name: Jason [name] MRN: [id number] Date of Birth: November 04, 1985  Today's Date: 01/20/2016 Time: 1004-1026 Time Calculation (min): 22 min  Subjective  Subjective: Pt asking [name] what we were spraying on wounds. Doesn't appear to recall having this treatment before. Usually pt remembers [name] from previous visit. 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  (Active)  Dressing Type ABD;Silver hydrofiber;Tape dressing 01/20/2016 10:53 AM  Dressing Changed;Clean;Dry;Intact 01/20/2016 10:53 AM  Dressing Change Frequency Daily 01/20/2016 10:53 AM  State of Healing Fully granulated 01/20/2016 10:53 AM  Site / Wound Assessment Pink;Red 01/20/2016 10:53 AM  % Wound base Red or Granulating 100% 01/20/2016 10:53 AM  % Wound base Yellow 0% 01/20/2016 10:53 AM  % Wound base Black 0% 01/20/2016 10:53 AM  % Wound base Other (Comment) 0% 01/20/2016 10:53 AM  Peri-wound Assessment Intact 01/20/2016 10:53 AM  Wound Length (cm) 12 cm 01/20/2016 10:53 AM  Wound Width (cm) 7 cm 01/20/2016 10:53 AM  Wound Depth (cm) 1 cm 01/20/2016 10:53 AM  Undermining (cm) Yes 12/08/2015  6:00 AM  Margins Unattached edges (unapproximated) 01/20/2016 10:53 AM  Drainage Amount Minimal 01/20/2016 10:53 AM  Drainage Description Serosanguineous 01/20/2016 10:53 AM  Treatment Hydrotherapy (Pulse lavage) 01/20/2016 10:53 AM     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 Bilateral Ishial pressure ulcers which were present PTA.  (Active)  Dressing Type ABD;Silver hydrofiber;Tape dressing 01/20/2016 10:53 AM  Dressing Changed;Clean;Dry;Intact 01/20/2016 10:53 AM  Dressing Change Frequency Daily 01/20/2016 10:53 AM  State of Healing Fully  granulated 01/20/2016 10:53 AM  Site / Wound Assessment Pink;Red 01/20/2016 10:53 AM  % Wound base Red or Granulating 100% 01/20/2016 10:53 AM  % Wound base Yellow 0% 01/20/2016 10:53 AM  % Wound base Black 5% 01/20/2016 10:53 AM  % Wound base Other (Comment) 5% 01/20/2016 10:53 AM  Peri-wound Assessment Intact 01/20/2016 10:53 AM  Wound Length (cm) 7.5 cm 01/20/2016 10:53 AM  Wound Width (cm) 8 cm 01/20/2016 10:53 AM  Wound Depth (cm) 2 cm 01/20/2016 10:53 AM  Undermining (cm) 1 cm undermining from 4 oclock to 12 oclock 01/20/2016 10:53 AM  Margins Unattached edges (unapproximated) 01/20/2016 10:53 AM  Drainage Amount Minimal 01/20/2016 10:53 AM  Drainage Description Serosanguineous 01/20/2016 10:53 AM  Treatment Hydrotherapy (Pulse lavage) 01/20/2016 10:53 AM     Wound / Incision (Open or Dehisced) 12/08/15 Ischial tuberosity Right Smaller wound no the upper fold of the buttocks (Active)  Dressing Type ABD;Silver hydrofiber;Tape dressing 01/20/2016 10:53 AM  Dressing Changed Changed 01/20/2016 10:53 AM  Dressing Status Clean;Dry;Intact 01/20/2016 10:53 AM  Dressing Change Frequency Daily 01/20/2016 10:53 AM  Site / Wound Assessment Pink;Red 01/20/2016 10:53 AM  % Wound base Red or Granulating 100% 01/20/2016 10:53 AM  % Wound base Yellow 0% 01/20/2016 10:53 AM  % Wound base Black 0% 01/20/2016 10:53 AM  % Wound base Other (Comment) 0% 01/20/2016 10:53 AM  Peri-wound Assessment Intact 01/20/2016 10:53 AM  Wound Length (cm) 8 cm 01/20/2016 10:53 AM  Wound Width (cm) 4 cm 01/20/2016 10:53 AM  Wound Depth (cm) 1 cm 01/20/2016 10:53 AM  Tunneling (cm) 1 cm at 12 oclock 01/20/2016 10:53 AM  Undermining (cm) yes 12/29/2015  5:20 PM  Margins Unattached edges (unapproximated) 01/20/2016 10:53 AM  Closure None 01/20/2016 10:53 AM  Drainage Amount Minimal 01/20/2016 10:53 AM  Drainage Description Serosanguineous 01/20/2016 10:53 AM  Treatment Hydrotherapy (Pulse lavage) 01/20/2016 10:53 AM      Hydrotherapy Pulsed  lavage therapy - wound location: sacrum and bil ischium Pulsed Lavage with Suction (psi): 4 psi (4-8) Pulsed Lavage with Suction - Normal Saline Used: 1000 mL Pulsed Lavage Tip: Tip with splash shield   Wound Assessment and Plan  Wound Therapy - Assess/Plan/Recommendations Wound Therapy - Clinical Statement: Pt presents to hydrotherapy with chronic sacral and ischial wounds. Currently wounds are clean with good granualation. No further hydrotherapy indicated. Will sign off. Wound Therapy - Follow Up Recommendations: Home health RN  Wound Therapy Goals- Improve the function of patient's integumentary system by progressing the wound(s) through the phases of wound healing (inflammation - proliferation - remodeling) by:    Goals will be updated until maximal potential achieved or discharge criteria met.  Discharge criteria: when goals achieved, discharge from hospital, MD decision/surgical intervention, no progress towards goals, refusal/missing three consecutive treatments without notification or medical reason.  GP     Rozina Pointer 01/20/2016, 3:22 PM [facility] PT [phone]

## 2016-01-20 NOTE — Progress Notes (Signed)
[place] TEAM 1 - Stepdown/ICU TEAM Progress Note  Jason [name] [id number] DOB: [birth year] DOA: 01/17/2016 PCP: [name], MD  Admit HPI / Brief Narrative: Jason [name] is a 30 y.o. 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 He is well known to our service from multiple prior admissions.Hx  MDR Pseudomonas and MRSA.Gastroparesis;Type I diabetes mellitus ;Diabetic neuropathy ; Dysrhythmia; Arthritis; Multiple allergies (11/02/2015); and Recurrent UTI (11/02/2015).   He had recent prolonged admission 12/06/15 through 01/05/16 for worsening hypoxic respiratory failure due to HCAP. He was discharged home and reports that he had done well since discharge. On 01/16/16, he spiked fever to 102 and mother reports that she suctioned thick yellow sputum from pt's trach. On 01/17/16, temp dropped to 101F but pt still had thick sputum along with mild SOB. Due to concern for developing PNA, his mother thought it would be best to bring him to ED for further evaluation.  In ED, CXR was concerning for multifocal PNA. In addition, he was found to have UTI and anemia with Hgb 6.5. Mother reports that he has had a little blood from his sacral wounds and had a feeling that his Hgb would be on the low side.  He was initially placed on full vent support in ED, but shortly after asked to come off so that he could eat. During my exam, he is resting comfortably, has PMV in and is eating dinner without problems. He denies any chest pain, SOB, N/V/D, abd pain, myalgias. He does have persistent back pain, unchanged from baseline. He is requesting to go home.  HPI/Subjective: 4/14 patient sitting up eating his dinner states feels significantly improved. MAXIMUM TEMPERATURE overnight 38.2C  Assessment/Plan: Acute on Chronic respiratory failure  - s/p  tracheostomy.  -Respiratory status significantly improved, still obtaining purulent yellow/green sputum from trach.Marland Kitchen  HCAP w/ Probable Tracheobronchitis:  -Tracheal aspirate thick purulent greenish yellow color consistent with Pseudomonas. Cultures pending.Hx MDR pseudomonas and MRSA PNA. -Continue Empiric Vancomycin, Cefepime,  Awaiting repeat cultures -Continue PMV as tolerated. -Full vent support as needed only. -Abx / cultures as below. -Albuterol. QID -Physiotherapy vest BID -NTS  BID. -CXR in AM.  Funguria/Yeast UTI: -True  infection vs colonization?  -Continue empiric abx (vanc / cefepime / diflucan).  Multiple decubitus ulcers -Spoke with PT do not believe hydrotherapy would be of benefit.  -Will discuss placement of wound VAC vs Aquacel foam dressing  Chronic hypotension  - Midodrine 20 mg TID -Continue Solucortef 10 mg  BID will continue to try titrate down   Hyponatremia. -Resolved   Acute blood loss anemia? vs Anemia Chronic disease  - 4/12 Transfuse 1u PRBC.  Gastroparesis.  Nutrition. -Glucerna 1.2 @ 168ml/hr -Pre-albumin LOW 10.3 -Patient heart healthy/carb modified diet  DM. Type I -Increase Lantus 15 units. Watch closely as patient still not consuming PO nutrition  -Sensitive SSI. -NovoLog 2 units q 4hrs to cover tube feedings     Code Status: FULL Family Communication: None present at time of exam Disposition Plan: Resolution sepsis; need better discharge plan as patient is in hospital q month    Consultants: [facility] M  Procedure/Significant Events:    Culture 4/11 blood right forearm/left AC NGTD 4/11 urine positive multiple species 4/11 MRSA by PCR positive 4/13 Tracheal Aspirate pending   Antibiotics: Cefepime 4/11>>  Vancomycin4/11>>   DVT prophylaxis: Subcutaneous heparin   Devices Tracheostomy in place  LINES / TUBES:  NA    Continuous Infusions: . sodium chloride Stopped (01/19/16 0800)  . feeding supplement  (GLUCERNA 1.2 CAL) 1,000 mL (01/19/16 1900)    Objective: VITAL SIGNS: Temp: 99.2 F (37.3 C) (04/14 0800) Temp Source: Oral (04/14 0800) BP: 150/93 mmHg (04/14 1000) Pulse Rate: 110 (04/14 1000) SPO2; FIO2:   Intake/Output Summary (Last 24 hours) at 01/20/16 1023 Last data filed at 01/20/16 1000  Gross per 24 hour  Intake 3205.9 ml  Output   3200 ml  Net    5.9 ml     Exam: General: A/O 4, sitting up in bed eating dinner.positive acute respiratory distress (requires frequent deep suctioning) Eyes: negative scleral hemorrhage ENT: Negative Runny nose, negative gingival bleeding, Neck:  Negative scars, masses, torticollis, lymphadenopathy, JVD Lungs: clear to auscultation bilateral  Cardiovascular: Tachycardic, Regular rhythm without murmur gallop or rub normal S1 and S2 Abdomen:negative abdominal pain, nondistended, positive soft, bowel sounds, no rebound, no ascites, no appreciable mass, ostomy bag in place LUQ with stool Skin; 3 large decubitus ulcers bilateral hip and sacrum to the bone, with some dead tissue visible. Extremities: No significant cyanosis, clubbing, or edema bilateral lower extremities, bilateral foot lacerations in various states of healing Psychiatric:  Negative depression, negative anxiety, negative fatigue, negative mania Neurologic:  Cranial nerves II through XII intact, tongue/uvula midline, quadriplegic    Data Reviewed: Basic Metabolic Panel:  Recent Labs Lab 01/18/16 0519  01/18/16 2055 01/19/16 0027 01/19/16 0246 01/19/16 1455 01/20/16 0318  NA 128*  < > 130* 131* 133* 137 137  K 5.1  < > 4.8 4.4 4.1 4.0 4.9  CL 98*  < > 101 101 104 107 106  CO2 11*  < > 17* 16* 17* 19* 20*  GLUCOSE 539*  < > 143* 96 113* 173* 318*  BUN 48*  < > 56* 55* 54* 46* 42*  CREATININE 1.44*  < > 1.36* 1.19 1.15 0.83 0.82  CALCIUM 8.9  < > 9.1 8.9 9.0 9.0 9.3  MG 1.7  --   --   --   --   --  1.8  PHOS 6.3*  --   --   --   --   --  2.7  < > = values in  this interval not displayed. Liver Function Tests:  Recent Labs Lab 01/17/16 1645 01/19/16 0246 01/20/16 0318  AST 18 14* 14*  ALT 15* 14* 14*  ALKPHOS 125 134* 119  BILITOT 0.5 0.3 0.4  PROT 6.6 7.1 7.1  ALBUMIN 2.2* 2.3* 2.2*   No results for input(s): LIPASE, AMYLASE in the last 168 hours.  Recent Labs Lab 01/19/16 0246  AMMONIA 49*   CBC:  Recent Labs Lab 01/17/16 1645 01/18/16 0519 01/19/16 0246 01/20/16 0318  WBC 27.8* 29.7* 34.6* 23.7*  NEUTROABS 25.6*  --   --  21.6*  HGB 6.5* 7.4* 8.0* 8.5*  HCT 20.3* 23.5* 25.5* 26.7*  MCV 84.9 86.4 82.3 82.4  PLT 320 322 358 406*   Cardiac Enzymes: No results for input(s): CKTOTAL, CKMB, CKMBINDEX, TROPONINI in the last 168 hours. BNP (last 3 results) No results for input(s): BNP in the last 8760 hours.  ProBNP (last 3 results) No results for input(s): PROBNP in the last 8760 hours.  CBG:  Recent Labs Lab 01/19/16 2130 01/19/16 2217 01/19/16 2354 01/20/16 0334 01/20/16 0822  GLUCAP 176* 182* 275* 308* 309*    Recent Results (from the past 240 hour(s))  Blood culture (routine x 2)  Status: None (Preliminary result)   Collection Time: 01/17/16  5:00 PM  Result Value Ref Range Status   Specimen Description BLOOD RIGHT FOREARM  Final   Special Requests BOTTLES DRAWN AEROBIC AND ANAEROBIC 5CC  Final   Culture NO GROWTH 2 DAYS  Final   Report Status PENDING  Incomplete  Blood culture (routine x 2)     Status: None (Preliminary result)   Collection Time: 01/17/16  5:15 PM  Result Value Ref Range Status   Specimen Description BLOOD LEFT ANTECUBITAL  Final   Special Requests IN PEDIATRIC BOTTLE 1CC  Final   Culture NO GROWTH 2 DAYS  Final   Report Status PENDING  Incomplete  Urine culture     Status: None   Collection Time: 01/17/16  7:32 PM  Result Value Ref Range Status   Specimen Description URINE, CATHETERIZED  Final   Special Requests Normal  Final   Culture MULTIPLE SPECIES PRESENT, SUGGEST  RECOLLECTION  Final   Report Status 01/19/2016 FINAL  Final  MRSA PCR Screening     Status: Abnormal   Collection Time: 01/17/16 11:50 PM  Result Value Ref Range Status   MRSA by PCR POSITIVE (A) NEGATIVE Final    Comment:        The GeneXpert MRSA Assay (FDA approved for NASAL specimens only), is one component of a comprehensive MRSA colonization surveillance program. It is not intended to diagnose MRSA infection nor to guide or monitor treatment for MRSA infections. RESULT CALLED TO, READ BACK BY AND VERIFIED WITH: MELVIN,RN ON 2H @0311  01/18/16 [email or web address]   Culture, respiratory (NON-Expectorated)     Status: None (Preliminary result)   Collection Time: 01/19/16  3:23 PM  Result Value Ref Range Status   Specimen Description TRACHEAL ASPIRATE  Final   Special Requests NONE  Final   Gram Stain   Final    ABUNDANT WBC PRESENT, PREDOMINANTLY PMN FEW SQUAMOUS EPITHELIAL CELLS PRESENT FEW GRAM POSITIVE COCCI IN PAIRS Performed at [facility]    Culture PENDING  Incomplete   Report Status PENDING  Incomplete     Studies:  Recent x-ray studies have been reviewed in detail by the Attending Physician  Scheduled Meds:  Scheduled Meds: . antiseptic oral rinse  7 mL Mouth Rinse q12n4p  . baclofen  20 mg Oral QID  . buPROPion  150 mg Oral Daily  . ceFEPime (MAXIPIME) IV  1 g Intravenous Q12H  . chlorhexidine  15 mL Mouth Rinse BID  . Chlorhexidine Gluconate Cloth  6 each Topical Q0600  . docusate  100 mg Oral BID  . famotidine (PEPCID) IV  20 mg Intravenous Q12H  . fentaNYL  50 mcg Transdermal Q72H  . ferrous sulfate  325 mg Oral Q breakfast  . heparin  5,000 Units Subcutaneous 3 times per day  . hydrocortisone  10 mg Oral BID  . insulin aspart  0-9 Units Subcutaneous 6 times per day  . insulin aspart  2 Units Subcutaneous Q4H  . loratadine  10 mg Oral Daily  . magnesium oxide  400 mg Oral BID  . methylPREDNISolone (SOLU-MEDROL) injection  20 mg Intravenous Q12H    . midodrine  20 mg Oral TID WC  . mupirocin ointment  1 application Nasal BID  . pneumococcal 23 valent vaccine  0.5 mL Intramuscular Tomorrow-1000  . pregabalin  100 mg Per Tube TID  . vancomycin  500 mg Intravenous Q24H    Time spent on care of this patient: 40 mins  Allie Bossier , MD  [facility] Office  [phone] Pager [phone]  On-Call/Text Page:      [email or web address]      password [email or web address]  If 7PM-7AM, please contact night-coverage [email or web address] Password [email or web address] 01/20/2016, 10:23 AM   LOS: 3 days   Care during the described time interval was provided by me .  I have reviewed this patient's available data, including medical history, events of note, physical examination, and all test results as part of my evaluation. I have personally reviewed and interpreted all radiology studies.   Dia Crawford, MD [phone] Pager

## 2016-01-20 NOTE — Progress Notes (Signed)
Offerred to help him with his breakfast tray, but claimed  to wait for his mother .

## 2016-01-21 ENCOUNTER — Other Ambulatory Visit: Payer: Self-pay | Admitting: Family Medicine

## 2016-01-21 DIAGNOSIS — A419 Sepsis, unspecified organism: Secondary | ICD-10-CM | POA: Diagnosis present

## 2016-01-21 DIAGNOSIS — J9621 Acute and chronic respiratory failure with hypoxia: Secondary | ICD-10-CM

## 2016-01-21 DIAGNOSIS — E875 Hyperkalemia: Secondary | ICD-10-CM | POA: Diagnosis present

## 2016-01-21 LAB — CBC WITH DIFFERENTIAL/PLATELET
BASOS PCT: 0 %
Basophils Absolute: 0 10*3/uL (ref 0.0–0.1)
EOS PCT: 0 %
Eosinophils Absolute: 0 10*3/uL (ref 0.0–0.7)
HEMATOCRIT: 26.2 % — AB (ref 39.0–52.0)
HEMOGLOBIN: 8.3 g/dL — AB (ref 13.0–17.0)
LYMPHS PCT: 12 %
Lymphs Abs: 2.4 10*3/uL (ref 0.7–4.0)
MCH: 26.9 pg (ref 26.0–34.0)
MCHC: 31.7 g/dL (ref 30.0–36.0)
MCV: 84.8 fL (ref 78.0–100.0)
MONOS PCT: 13 %
Monocytes Absolute: 2.6 10*3/uL — ABNORMAL HIGH (ref 0.1–1.0)
NEUTROS PCT: 75 %
Neutro Abs: 15.3 10*3/uL — ABNORMAL HIGH (ref 1.7–7.7)
Platelets: 393 10*3/uL (ref 150–400)
RBC: 3.09 MIL/uL — ABNORMAL LOW (ref 4.22–5.81)
RDW: 19.2 % — ABNORMAL HIGH (ref 11.5–15.5)
WBC: 20.3 10*3/uL — ABNORMAL HIGH (ref 4.0–10.5)

## 2016-01-21 LAB — URINE CULTURE: Culture: >=100000 — AB

## 2016-01-21 LAB — GLUCOSE, CAPILLARY
GLUCOSE-CAPILLARY: 269 mg/dL — AB (ref 65–99)
GLUCOSE-CAPILLARY: 290 mg/dL — AB (ref 65–99)
GLUCOSE-CAPILLARY: 322 mg/dL — AB (ref 65–99)
Glucose-Capillary: 257 mg/dL — ABNORMAL HIGH (ref 65–99)
Glucose-Capillary: 356 mg/dL — ABNORMAL HIGH (ref 65–99)

## 2016-01-21 LAB — COMPREHENSIVE METABOLIC PANEL
ALK PHOS: 104 U/L (ref 38–126)
ALT: 13 U/L — AB (ref 17–63)
AST: 16 U/L (ref 15–41)
Albumin: 2.1 g/dL — ABNORMAL LOW (ref 3.5–5.0)
Anion gap: 10 (ref 5–15)
BUN: 42 mg/dL — ABNORMAL HIGH (ref 6–20)
CALCIUM: 9.3 mg/dL (ref 8.9–10.3)
CO2: 22 mmol/L (ref 22–32)
CREATININE: 0.62 mg/dL (ref 0.61–1.24)
Chloride: 106 mmol/L (ref 101–111)
Glucose, Bld: 259 mg/dL — ABNORMAL HIGH (ref 65–99)
Potassium: 5.5 mmol/L — ABNORMAL HIGH (ref 3.5–5.1)
SODIUM: 138 mmol/L (ref 135–145)
Total Bilirubin: 0.4 mg/dL (ref 0.3–1.2)
Total Protein: 6.9 g/dL (ref 6.5–8.1)

## 2016-01-21 LAB — VANCOMYCIN, TROUGH: VANCOMYCIN TR: 11 ug/mL (ref 10.0–20.0)

## 2016-01-21 LAB — MAGNESIUM: Magnesium: 1.6 mg/dL — ABNORMAL LOW (ref 1.7–2.4)

## 2016-01-21 MED ORDER — FAMOTIDINE IN NACL 20-0.9 MG/50ML-% IV SOLN
20.0000 mg | Freq: Three times a day (TID) | INTRAVENOUS | Status: DC
  Administered 2016-01-21 – 2016-01-24 (×9): 20 mg via INTRAVENOUS
  Filled 2016-01-21 (×8): qty 50

## 2016-01-21 MED ORDER — INSULIN ASPART 100 UNIT/ML ~~LOC~~ SOLN
0.0000 [IU] | SUBCUTANEOUS | Status: DC
  Administered 2016-01-21: 8 [IU] via SUBCUTANEOUS
  Administered 2016-01-21: 5 [IU] via SUBCUTANEOUS
  Administered 2016-01-22: 8 [IU] via SUBCUTANEOUS
  Administered 2016-01-22: 3 [IU] via SUBCUTANEOUS

## 2016-01-21 MED ORDER — METHYLPREDNISOLONE SODIUM SUCC 40 MG IJ SOLR: 20.0000 mg | Freq: Three times a day (TID) | INTRAMUSCULAR | Status: DC

## 2016-01-21 MED ORDER — METHYLPREDNISOLONE SODIUM SUCC 40 MG IJ SOLR
20.0000 mg | Freq: Two times a day (BID) | INTRAMUSCULAR | Status: DC
  Administered 2016-01-21 – 2016-01-25 (×8): 20 mg via INTRAVENOUS
  Filled 2016-01-21 (×8): qty 1

## 2016-01-21 MED ORDER — INSULIN GLARGINE 100 UNIT/ML ~~LOC~~ SOLN
20.0000 [IU] | Freq: Every day | SUBCUTANEOUS | Status: DC
  Filled 2016-01-21: qty 0.2

## 2016-01-21 MED ORDER — VANCOMYCIN HCL IN DEXTROSE 750-5 MG/150ML-% IV SOLN
750.0000 mg | INTRAVENOUS | Status: DC
  Administered 2016-01-21 – 2016-01-22 (×2): 750 mg via INTRAVENOUS
  Filled 2016-01-21 (×3): qty 150

## 2016-01-21 MED ORDER — ACETYLCYSTEINE 10% NICU INHALATION SOLUTION
2.0000 mL | Freq: Two times a day (BID) | RESPIRATORY_TRACT | Status: AC
  Administered 2016-01-21 – 2016-01-22 (×2): 2 mL via RESPIRATORY_TRACT
  Filled 2016-01-21 (×2): qty 2

## 2016-01-21 MED ORDER — DEXTROSE 5 % IV SOLN
1.0000 g | Freq: Three times a day (TID) | INTRAVENOUS | Status: DC
  Administered 2016-01-21 – 2016-01-23 (×6): 1 g via INTRAVENOUS
  Filled 2016-01-21 (×8): qty 1

## 2016-01-21 MED ORDER — HYDROCORTISONE 5 MG PO TABS
5.0000 mg | ORAL_TABLET | Freq: Two times a day (BID) | ORAL | Status: DC
  Administered 2016-01-21 – 2016-01-23 (×4): 5 mg via ORAL
  Filled 2016-01-21 (×4): qty 1

## 2016-01-21 NOTE — Progress Notes (Signed)
CM received call from MD to please arange for percussion vest.  Cm called [employer] rep, Juliann Pulse who called back and states Medicaid will pay for vest with no copay if pt meets criteria.  Juliann Pulse is submitting documentation.

## 2016-01-21 NOTE — Progress Notes (Signed)
Patient's urine is leaking around suprapubic catheter at insertion site. Urine is not flowing into bag. [facility] called and made aware. Will continue to monitor.

## 2016-01-21 NOTE — Progress Notes (Signed)
Patient states that he would like to hold on doing CPT at this time.  Said that we could do it later today.  Will continue to monitor.

## 2016-01-21 NOTE — Progress Notes (Signed)
Jason [name] - Stepdown/ICU TEAM Progress Note  Jason [name] [id number] DOB: 12-11-1985 DOA: 01/17/2016 PCP: Jason Morale, MD  Admit HPI / Brief Narrative: Jason [name] is a 30 y.o. 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 He is well known to our service from multiple prior admissions.Hx  MDR Pseudomonas and MRSA.Gastroparesis;Type I diabetes mellitus ;Diabetic neuropathy ; Dysrhythmia; Arthritis; Multiple allergies ([name]/25/2017); and Recurrent UTI ([name]/25/2017).   He had recent prolonged admission 12/06/15 through 01/05/16 for worsening hypoxic respiratory failure due to HCAP. He was discharged home and reports that he had done well since discharge. On 01/16/16, he spiked fever to 102 and mother reports that she suctioned thick yellow sputum from pt's trach. On 01/17/16, temp dropped to 101F but pt still had thick sputum along with mild SOB. Due to concern for developing PNA, his mother thought it would be best to bring him to ED for further evaluation.  In ED, CXR was concerning for multifocal PNA. In addition, he was found to have UTI and anemia with Hgb 6.5. Mother reports that he has had a little blood from his sacral wounds and had a feeling that his Hgb would be on the low side.  He was initially placed on full vent support in ED, but shortly after asked to come off so that he could eat. During my exam, he is resting comfortably, has PMV in and is eating dinner without problems. He denies any chest pain, SOB, N/V/D, abd pain, myalgias. He does have persistent back pain, unchanged from baseline. He is requesting to go home.  HPI/Subjective: 4/15 MAXIMUM TEMPERATURE overnight 38.2C A/O 4, NAD   Assessment/Plan: Acute on Chronic respiratory failure with hypoxia  - s/p tracheostomy.  -Respiratory status significantly  improved, still obtaining purulent yellow/green sputum from trach..  Sepsis unspecified organism/HCAP w/ Probable Tracheobronchitis:  -Tracheal aspirate thick purulent greenish yellow color consistent with Pseudomonas. Cultures pending.Hx MDR pseudomonas and MRSA PNA. -Continue Empiric Vancomycin, Cefepime,  Awaiting repeat cultures -Continue PMV as tolerated. -Full vent support as needed only. -Albuterol. QID -Physiotherapy vest BID -NTS  BID. -Decrease Solu-Medrol 20 mg BID -Mucomyst nebulizer BID for 2 doses -NCM Mariane Masters looking into obtaining physiotherapy vest for home  Funguria/Yeast UTI: -True  infection vs colonization?  -Continue empiric abx (vanc / cefepime / diflucan).  Multiple decubitus ulcers -Spoke with PT do not believe hydrotherapy would be of benefit.  -Will discuss placement of wound VAC vs Aquacel foam dressing; patient would like to also speak to wound care before making decision. Consulted wound care  Chronic Hypotension  -Patient's BP continues to trend up although usually hypotensive - Midodrine 20 mg TID -Decrease Solucortef 5 mg  BID   Hyponatremia. -Resolved   Acute blood loss anemia? vs Anemia Chronic disease  - 4/12 Transfuse 1u PRBC.  Gastroparesis.  Nutrition. -Glucerna [name].2 @ 127ml/hr? -Pre-albumin LOW 10.3 -Patient heart healthy/carb modified diet  DM. Type I -Increase Lantus 20 units. Patient now consuming PO  -Increase to moderate SSI. -NovoLog 2 units q 4hrs to cover tube feedings -Hemoglobin A1c pending -Lipid panel pending  Hyperkalemia -Slightly elevated monitor for now.    Code Status: FULL Family Communication: None present at time of exam Disposition Plan: Resolution sepsis; need better discharge plan as patient is in hospital q month    Consultants: [facility]  Procedure/Significant Events:    Culture 4/11 blood right  forearm/left AC NGTD 4/11 urine positive multiple species 4/11 MRSA by PCR  positive 4/13 Tracheal Aspirate pending   Antibiotics: Cefepime 4/11>>  Vancomycin4/11>>   DVT prophylaxis: Subcutaneous heparin   Devices Tracheostomy in place   LINES / TUBES:  NA    Continuous Infusions: . sodium chloride Stopped (01/19/16 0800)  . feeding supplement (GLUCERNA [name].2 CAL) [name],000 mL (01/21/16 0600)    Objective: VITAL SIGNS: Temp: 98.4 F (36.9 C) (04/15 0904) Temp Source: Oral (04/15 0904) BP: 147/102 mmHg (04/15 1600) Pulse Rate: 92 (04/15 1600) SPO2; FIO2:   Intake/Output Summary (Last 24 hours) at 01/21/16 1903 Last data filed at 01/21/16 1600  Gross per 24 hour  Intake   2300 ml  Output   2300 ml  Net      0 ml     Exam: General: A/O 4, sitting up in bed Negative acute respiratory distress (requires frequent deep suctioning) Eyes: negative scleral hemorrhage ENT: Negative Runny nose, negative gingival bleeding, Neck:  Negative scars, masses, torticollis, lymphadenopathy, JVD Lungs: clear to auscultation bilateral  Cardiovascular: Regular rhythm and rate without murmur gallop or rub normal S1 and S2 Abdomen:negative abdominal pain, nondistended, positive soft, bowel sounds, no rebound, no ascites, no appreciable mass, ostomy bag in place LUQ with stool Skin; 3 large decubitus ulcers bilateral hip and sacrum to the bone, with some dead tissue visible. Extremities: No significant cyanosis, clubbing, or edema bilateral lower extremities, bilateral foot lacerations in various states of healing Psychiatric:  Negative depression, negative anxiety, negative fatigue, negative mania Neurologic:  Cranial nerves II through XII intact, tongue/uvula midline, quadriplegic    Data Reviewed: Basic Metabolic Panel:  Recent Labs Lab 01/18/16 0519  01/19/16 0027 01/19/16 0246 01/19/16 1455 01/20/16 0318 01/21/16 0331  NA 128*  < > 131* 133* 137 137 138  K 5.[name]  < > 4.4 4.[name] 4.0 4.9 5.5*  CL 98*  < > 101 104 107 106 106  CO2 11*  < > 16* 17*  19* 20* 22  GLUCOSE 539*  < > 96 113* 173* 318* 259*  BUN 48*  < > 55* 54* 46* 42* 42*  CREATININE [name].44*  < > [name].19 [name].15 0.83 0.82 0.62  CALCIUM 8.9  < > 8.9 9.0 9.0 9.3 9.3  MG [name].7  --   --   --   --  [name].8 [name].6*  PHOS 6.3*  --   --   --   --  2.7  --   < > = values in this interval not displayed. Liver Function Tests:  Recent Labs Lab 01/17/16 1645 01/19/16 0246 01/20/16 0318 01/21/16 0331  AST 18 14* 14* 16  ALT 15* 14* 14* 13*  ALKPHOS 125 134* 119 104  BILITOT 0.5 0.3 0.4 0.4  PROT 6.6 7.[name] 7.[name] 6.9  ALBUMIN 2.2* 2.3* 2.2* 2.[name]*   No results for input(s): LIPASE, AMYLASE in the last 168 hours.  Recent Labs Lab 01/19/16 0246  AMMONIA 49*   CBC:  Recent Labs Lab 01/17/16 1645 01/18/16 0519 01/19/16 0246 01/20/16 0318 01/21/16 0331  WBC 27.8* 29.7* 34.6* 23.7* 20.3*  NEUTROABS 25.6*  --   --  21.6* 15.3*  HGB 6.5* 7.4* 8.0* 8.5* 8.3*  HCT 20.3* 23.5* 25.5* 26.7* 26.2*  MCV 84.9 86.4 82.3 82.4 84.8  PLT 320 322 358 406* 393   Cardiac Enzymes: No results for input(s): CKTOTAL, CKMB, CKMBINDEX, TROPONINI in the last 168 hours. BNP (last 3 results) No results for input(s): BNP in the last  8760 hours.  ProBNP (last 3 results) No results for input(s): PROBNP in the last 8760 hours.  CBG:  Recent Labs Lab 01/21/16 0029 01/21/16 0421 01/21/16 0900 01/21/16 1221 01/21/16 1637  GLUCAP 322* 257* 356* 290* 269*    Recent Results (from the past 240 hour(s))  Blood culture (routine x 2)     Status: None (Preliminary result)   Collection Time: 01/17/16  5:00 PM  Result Value Ref Range Status   Specimen Description BLOOD RIGHT FOREARM  Final   Special Requests BOTTLES DRAWN AEROBIC AND ANAEROBIC 5CC  Final   Culture NO GROWTH 3 DAYS  Final   Report Status PENDING  Incomplete  Blood culture (routine x 2)     Status: None (Preliminary result)   Collection Time: 01/17/16  5:15 PM  Result Value Ref Range Status   Specimen Description BLOOD LEFT ANTECUBITAL  Final    Special Requests IN PEDIATRIC BOTTLE 1CC  Final   Culture NO GROWTH 3 DAYS  Final   Report Status PENDING  Incomplete  Urine culture     Status: None   Collection Time: 01/17/16  7:32 PM  Result Value Ref Range Status   Specimen Description URINE, CATHETERIZED  Final   Special Requests Normal  Final   Culture MULTIPLE SPECIES PRESENT, SUGGEST RECOLLECTION  Final   Report Status 01/19/2016 FINAL  Final  MRSA PCR Screening     Status: Abnormal   Collection Time: 01/17/16 11:50 PM  Result Value Ref Range Status   MRSA by PCR POSITIVE (A) NEGATIVE Final    Comment:        The GeneXpert MRSA Assay (FDA approved for NASAL specimens only), is one component of a comprehensive MRSA colonization surveillance program. It is not intended to diagnose MRSA infection nor to guide or monitor treatment for MRSA infections. RESULT CALLED TO, READ BACK BY AND VERIFIED WITH: MELVIN,RN ON 2H @0311  01/18/16 MKELLYM   Culture, respiratory (NON-Expectorated)     Status: None (Preliminary result)   Collection Time: 01/19/16  3:23 PM  Result Value Ref Range Status   Specimen Description TRACHEAL ASPIRATE  Final   Special Requests NONE  Final   Gram Stain   Final    ABUNDANT WBC PRESENT, PREDOMINANTLY PMN FEW SQUAMOUS EPITHELIAL CELLS PRESENT FEW GRAM POSITIVE COCCI IN PAIRS Performed at [facility]    Culture   Final    FEW GRAM NEGATIVE RODS Performed at [facility]    Report Status PENDING  Incomplete  Culture, Urine     Status: Abnormal   Collection Time: 01/19/16  6:41 PM  Result Value Ref Range Status   Specimen Description URINE, CATHETERIZED  Final   Special Requests NONE  Final   Culture >=100,000 COLONIES/mL YEAST (A)  Final   Report Status 01/21/2016 FINAL  Final     Studies:  Recent x-ray studies have been reviewed in detail by the Attending Physician  Scheduled Meds:  Scheduled Meds: . acetylcysteine  2 mL Nebulization BID  . antiseptic oral rinse   7 mL Mouth Rinse q12n4p  . baclofen  20 mg Oral QID  . buPROPion  150 mg Oral Daily  . ceFEPime (MAXIPIME) IV  [name] g Intravenous 3 times per day  . chlorhexidine  15 mL Mouth Rinse BID  . Chlorhexidine Gluconate Cloth  6 each Topical Q0600  . docusate  100 mg Oral BID  . famotidine (PEPCID) IV  20 mg Intravenous 3 times per day  . fentaNYL  50 mcg Transdermal Q72H  . ferrous sulfate  325 mg Oral Q breakfast  . heparin  5,000 Units Subcutaneous 3 times per day  . hydrocortisone  5 mg Oral BID  . insulin aspart  0-15 Units Subcutaneous 6 times per day  . insulin aspart  2 Units Subcutaneous Q4H  . [START ON 01/22/2016] insulin glargine  20 Units Subcutaneous Daily  . loratadine  10 mg Oral Daily  . magnesium oxide  400 mg Oral BID  . methylPREDNISolone (SOLU-MEDROL) injection  20 mg Intravenous Q12H  . midodrine  20 mg Oral TID WC  . mupirocin ointment  [name] application Nasal BID  . pregabalin  100 mg Per Tube TID  . vancomycin  500 mg Intravenous Q24H    Time spent on care of this patient: 40 mins   WOODS, Geraldo Docker , MD  [facility] Office  [phone] Pager [phone]  On-Call/Text Page:      [email or web address]      password [email or web address]  If 7PM-7AM, please contact night-coverage [email or web address] Password [email or web address] 01/21/2016, 7:03 PM   LOS: 4 days   Care during the described time interval was provided by me .  I have reviewed this patient's available data, including medical history, events of note, physical examination, and all test results as part of my evaluation. I have personally reviewed and interpreted all radiology studies.   Dia Crawford, MD [phone] Pager

## 2016-01-21 NOTE — Progress Notes (Addendum)
Pharmacy Antibiotic Note  Jason [name] is a 30 y.o. male admitted on 01/17/2016 with pneumonia.  Pharmacy has been consulted for vancomycin and cefepime dosing.   History of multiple MDR pathogens in past. Required pre-treatment with solu-medrol and famotidine prior to cefepime in the past due to cefuroxime allergy.   Due to paraplegia, renal fxn is difficult to estimate. However, Cr improving to 0.62 and uop now 2.5 ml/kg/h. Afebrile, wbc down to 20.3.  Vanc trough came back at 11 tonight  Plan:  - Increase vanc to 750g IV q24 - Increased cefepime to 1g qh8 - Pre-medicate cefepime with solu-medrol 20mg  and famotidine 20mg  - consider DC if pt continue to tolerate cefepime  Height: 5\' 8"  (172.7 cm) Weight: 88 lb (39.917 kg) IBW/kg (Calculated) : 68.4  Temp (24hrs), Avg:99.3 F (37.4 C), Min:98.1 F (36.7 C), Max:100.7 F (38.2 C)   Recent Labs Lab 01/17/16 1638  01/17/16 1645 01/17/16 2307 01/18/16 0519  01/19/16 0027 01/19/16 0032 01/19/16 0246 01/19/16 1017 01/19/16 1455 01/20/16 0318 01/21/16 0331 01/21/16 1844  WBC  --   --  27.8*  --  29.7*  --   --   --  34.6*  --   --  23.7* 20.3*  --   CREATININE  --   < > 0.95  --  1.44*  < > 1.19  --  1.15  --  0.83 0.82 0.62  --   LATICACIDVEN 0.99  --   --  1.12  --   --   --  1.1  --  0.9  --  2.0  --   --   VANCOTROUGH  --   --   --   --   --   --   --   --   --   --   --   --   --  11  < > = values in this interval not displayed.  Estimated Creatinine Clearance: 75.5 mL/min (by C-G formula based on Cr of 0.62).    Allergies  Allergen Reactions  . Cefuroxime Axetil Anaphylaxis    Tolerated cefepime 12/2015 - with Pepcid/Solu-Medrol  . Ertapenem Other (See Comments)    Rash and confusion-->tolerated Imipenem   . Morphine And Related Other (See Comments)    Changed mental status, confusion, headache, visual hallucination  . Penicillins Anaphylaxis and Other (See Comments)    Tolerated Imipenem; no reaction to 7  day course of amoxicillin in 2015 Has patient had a PCN reaction causing immediate rash, facial/tongue/throat swelling, SOB or lightheadedness with hypotension: Yes Has patient had a PCN reaction causing severe rash involving mucus membranes or skin necrosis: No Has patient had a PCN reaction that required hospitalization Yes Has patient had a PCN reaction occurring within the last 10 years: No If all of the above answers are "NO", then may proceed with Cephalo  . Sulfa Antibiotics Anaphylaxis, Shortness Of Breath and Other (See Comments)  . Tessalon [Benzonatate] Anaphylaxis  . Shellfish Allergy Itching and Other (See Comments)    Took benadryl to alleviate reaction  . Levaquin [Levofloxacin In D5w] Swelling    Hand and forearm swelling  . Nsaids Itching and Other (See Comments)    Risk of bleeding, itching  . Miripirium Rash and Other (See Comments)    Change in mental status    Antimicrobials this admission: Vanc 4/11>> Cefepime 4/11>>  Dose adjustments this admission: Cefepime to 1g q8h  Microbiology results: Pending - negative to date  Onnie Boer, PharmD  Pager: [phone] 01/21/2016 8:10 PM

## 2016-01-21 NOTE — Progress Notes (Signed)
Pharmacy Antibiotic Note  Jason [name] is a 30 y.o. male admitted on 01/17/2016 with pneumonia.  Pharmacy has been consulted for vancomycin and cefepime dosing.   History of multiple MDR pathogens in past. Required pre-treatment with solu-medrol and famotidine prior to cefepime in the past due to cefuroxime allergy.   Due to paraplegia, renal fxn is difficult to estimate. However, Cr improving to 0.62 and uop now 2.5 ml/kg/h. Afebrile, wbc down to 20.3.  Plan: - VT at 1730 tonight  - Increased cefepime to 1g qh8 - Pre-medicate cefepime with solu-medrol 20mg  and famotidine 20mg  - consider DC if pt continue to tolerate cefepime  Height: 5\' 8"  (172.7 cm) Weight: 88 lb (39.917 kg) IBW/kg (Calculated) : 68.4  Temp (24hrs), Avg:99.8 F (37.7 C), Min:98.4 F (36.9 C), Max:100.7 F (38.2 C)   Recent Labs Lab 01/17/16 1638  01/17/16 1645 01/17/16 2307 01/18/16 0519  01/19/16 0027 01/19/16 0032 01/19/16 0246 01/19/16 1017 01/19/16 1455 01/20/16 0318 01/21/16 0331  WBC  --   --  27.8*  --  29.7*  --   --   --  34.6*  --   --  23.7* 20.3*  CREATININE  --   < > 0.95  --  1.44*  < > 1.19  --  1.15  --  0.83 0.82 0.62  LATICACIDVEN 0.99  --   --  1.12  --   --   --  1.1  --  0.9  --  2.0  --   < > = values in this interval not displayed.  Estimated Creatinine Clearance: 75.5 mL/min (by C-G formula based on Cr of 0.62).    Allergies  Allergen Reactions  . Cefuroxime Axetil Anaphylaxis    Tolerated cefepime 12/2015 - with Pepcid/Solu-Medrol  . Ertapenem Other (See Comments)    Rash and confusion-->tolerated Imipenem   . Morphine And Related Other (See Comments)    Changed mental status, confusion, headache, visual hallucination  . Penicillins Anaphylaxis and Other (See Comments)    Tolerated Imipenem; no reaction to 7 day course of amoxicillin in 2015 Has patient had a PCN reaction causing immediate rash, facial/tongue/throat swelling, SOB or lightheadedness with hypotension:  Yes Has patient had a PCN reaction causing severe rash involving mucus membranes or skin necrosis: No Has patient had a PCN reaction that required hospitalization Yes Has patient had a PCN reaction occurring within the last 10 years: No If all of the above answers are "NO", then may proceed with Cephalo  . Sulfa Antibiotics Anaphylaxis, Shortness Of Breath and Other (See Comments)  . Tessalon [Benzonatate] Anaphylaxis  . Shellfish Allergy Itching and Other (See Comments)    Took benadryl to alleviate reaction  . Levaquin [Levofloxacin In D5w] Swelling    Hand and forearm swelling  . Nsaids Itching and Other (See Comments)    Risk of bleeding, itching  . Miripirium Rash and Other (See Comments)    Change in mental status    Antimicrobials this admission: Vanc 4/11>> Cefepime 4/11>>  Dose adjustments this admission: Cefepime to 1g q8h  Microbiology results: Pending - negative to date   Heloise Ochoa, [place].D., BCPS PGY2 Pharmacy Resident Pager: [phone]  01/21/2016 3:37 PM

## 2016-01-22 LAB — LIPID PANEL
CHOLESTEROL: 138 mg/dL (ref 0–200)
HDL: 42 mg/dL (ref >40)
LDL Cholesterol: 58 mg/dL (ref 0–99)
TRIGLYCERIDES: 188 mg/dL — AB (ref <150)
Total CHOL/HDL Ratio: 3.3 RATIO
VLDL: 38 mg/dL (ref 0–40)

## 2016-01-22 LAB — CBC WITH DIFFERENTIAL/PLATELET
BASOS PCT: 1 %
Basophils Absolute: 0.3 10*3/uL — ABNORMAL HIGH (ref 0.0–0.1)
EOS ABS: 0 10*3/uL (ref 0.0–0.7)
Eosinophils Relative: 0 %
HCT: 29.9 % — ABNORMAL LOW (ref 39.0–52.0)
HEMOGLOBIN: 9.2 g/dL — AB (ref 13.0–17.0)
LYMPHS PCT: 10 %
Lymphs Abs: 2.5 10*3/uL (ref 0.7–4.0)
MCH: 26.5 pg (ref 26.0–34.0)
MCHC: 30.8 g/dL (ref 30.0–36.0)
MCV: 86.2 fL (ref 78.0–100.0)
MONO ABS: 2 10*3/uL — AB (ref 0.1–1.0)
Monocytes Relative: 8 %
NEUTROS ABS: 20.4 10*3/uL — AB (ref 1.7–7.7)
Neutrophils Relative %: 81 %
Platelets: 456 10*3/uL — ABNORMAL HIGH (ref 150–400)
RBC: 3.47 MIL/uL — ABNORMAL LOW (ref 4.22–5.81)
RDW: 19.2 % — ABNORMAL HIGH (ref 11.5–15.5)
WBC: 25.2 10*3/uL — ABNORMAL HIGH (ref 4.0–10.5)

## 2016-01-22 LAB — GLUCOSE, CAPILLARY
GLUCOSE-CAPILLARY: 179 mg/dL — AB (ref 65–99)
GLUCOSE-CAPILLARY: 230 mg/dL — AB (ref 65–99)
GLUCOSE-CAPILLARY: 276 mg/dL — AB (ref 65–99)
GLUCOSE-CAPILLARY: 292 mg/dL — AB (ref 65–99)
GLUCOSE-CAPILLARY: 84 mg/dL (ref 65–99)
Glucose-Capillary: 209 mg/dL — ABNORMAL HIGH (ref 65–99)
Glucose-Capillary: 209 mg/dL — ABNORMAL HIGH (ref 65–99)
Glucose-Capillary: 88 mg/dL (ref 65–99)
Glucose-Capillary: 229 mg/dL — ABNORMAL HIGH (ref 65–99)
Glucose-Capillary: 354 mg/dL — ABNORMAL HIGH (ref 65–99)

## 2016-01-22 LAB — COMPREHENSIVE METABOLIC PANEL
ALK PHOS: 106 U/L (ref 38–126)
ALT: 17 U/L (ref 17–63)
ANION GAP: 9 (ref 5–15)
AST: 22 U/L (ref 15–41)
Albumin: 2.3 g/dL — ABNORMAL LOW (ref 3.5–5.0)
BUN: 43 mg/dL — ABNORMAL HIGH (ref 6–20)
CALCIUM: 9.4 mg/dL (ref 8.9–10.3)
CO2: 23 mmol/L (ref 22–32)
Chloride: 102 mmol/L (ref 101–111)
Creatinine, Ser: 0.61 mg/dL (ref 0.61–1.24)
Glucose, Bld: 235 mg/dL — ABNORMAL HIGH (ref 65–99)
Potassium: 7.1 mmol/L (ref 3.5–5.1)
SODIUM: 134 mmol/L — AB (ref 135–145)
TOTAL PROTEIN: 6.9 g/dL (ref 6.5–8.1)
Total Bilirubin: 0.4 mg/dL (ref 0.3–1.2)

## 2016-01-22 LAB — CULTURE, BLOOD (ROUTINE X 2)
CULTURE: NO GROWTH
CULTURE: NO GROWTH

## 2016-01-22 LAB — MAGNESIUM
MAGNESIUM: 1.6 mg/dL — AB (ref 1.7–2.4)
MAGNESIUM: 1.6 mg/dL — AB (ref 1.7–2.4)
Magnesium: 2.3 mg/dL (ref 1.7–2.4)

## 2016-01-22 LAB — POTASSIUM
POTASSIUM: 6.1 mmol/L — AB (ref 3.5–5.1)
Potassium: 5.9 mmol/L — ABNORMAL HIGH (ref 3.5–5.1)

## 2016-01-22 MED ORDER — MAGNESIUM SULFATE 50 % IJ SOLN
3.0000 g | Freq: Once | INTRAVENOUS | Status: DC
  Filled 2016-01-22: qty 6

## 2016-01-22 MED ORDER — SODIUM POLYSTYRENE SULFONATE 15 GM/60ML PO SUSP: 60.0000 g | Freq: Once | ORAL | Status: DC

## 2016-01-22 MED ORDER — INSULIN GLARGINE 100 UNIT/ML ~~LOC~~ SOLN
28.0000 [IU] | Freq: Every day | SUBCUTANEOUS | Status: DC
  Administered 2016-01-22 – 2016-01-25 (×4): 28 [IU] via SUBCUTANEOUS
  Filled 2016-01-22 (×4): qty 0.28

## 2016-01-22 MED ORDER — INSULIN ASPART 100 UNIT/ML ~~LOC~~ SOLN
10.0000 [IU] | Freq: Once | SUBCUTANEOUS | Status: AC
  Administered 2016-01-22: 10 [IU] via INTRAVENOUS

## 2016-01-22 MED ORDER — FLUCONAZOLE 200 MG PO TABS
200.0000 mg | ORAL_TABLET | Freq: Every day | ORAL | Status: DC
  Administered 2016-01-22 – 2016-01-26 (×5): 200 mg via ORAL
  Filled 2016-01-22 (×5): qty 1

## 2016-01-22 MED ORDER — MAGNESIUM SULFATE 50 % IJ SOLN
3.0000 g | Freq: Once | INTRAVENOUS | Status: AC
  Administered 2016-01-22: 3 g via INTRAVENOUS
  Filled 2016-01-22: qty 6

## 2016-01-22 MED ORDER — SODIUM POLYSTYRENE SULFONATE 15 GM/60ML PO SUSP
45.0000 g | Freq: Once | ORAL | Status: AC
  Administered 2016-01-22: 45 g via ORAL
  Filled 2016-01-22: qty 180

## 2016-01-22 MED ORDER — INSULIN ASPART 100 UNIT/ML ~~LOC~~ SOLN
6.0000 [IU] | SUBCUTANEOUS | Status: DC
  Administered 2016-01-22 (×3): 6 [IU] via SUBCUTANEOUS

## 2016-01-22 MED ORDER — SODIUM CHLORIDE 0.9 % IV SOLN
1.0000 g | Freq: Once | INTRAVENOUS | Status: AC
  Administered 2016-01-22: 1 g via INTRAVENOUS
  Filled 2016-01-22: qty 10

## 2016-01-22 MED ORDER — INSULIN ASPART 100 UNIT/ML ~~LOC~~ SOLN: 10.0000 [IU] | Freq: Once | SUBCUTANEOUS | Status: DC

## 2016-01-22 MED ORDER — INSULIN ASPART 100 UNIT/ML ~~LOC~~ SOLN
0.0000 [IU] | SUBCUTANEOUS | Status: DC
  Administered 2016-01-22: 7 [IU] via SUBCUTANEOUS
  Administered 2016-01-22: 20 [IU] via SUBCUTANEOUS
  Administered 2016-01-22 – 2016-01-23 (×2): 11 [IU] via SUBCUTANEOUS

## 2016-01-22 MED ORDER — DEXTROSE 50 % IV SOLN
1.0000 | Freq: Once | INTRAVENOUS | Status: AC
  Administered 2016-01-22: 50 mL via INTRAVENOUS
  Filled 2016-01-22: qty 50

## 2016-01-22 NOTE — Progress Notes (Signed)
CRITICAL VALUE ALERT  Critical value received:  K 6.1 Date of notification:  01/22/2016  Time of notification:  0852  Critical value read back:Yes.    Nurse who received alert:  Allena Katz  MD notified (1st page):  Dr. Philis Pique  Time of first page:  0900  MD notified (2nd page):  Time of second page:  Responding MD:  Dr. Philis Pique  Time MD responded:  [phone]

## 2016-01-22 NOTE — Progress Notes (Signed)
Patient refused CPT this AM.

## 2016-01-22 NOTE — Progress Notes (Signed)
[facility] TEAM 1 - Stepdown/ICU TEAM Progress Note  Jason [name] [id number] DOB: 28-Nov-1985 DOA: 01/17/2016 PCP: [name], MD  Admit HPI / Brief Narrative: Jason [name] is a 30 y.o. 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 He is well known to our service from multiple prior admissions.Hx  MDR Pseudomonas and MRSA.Gastroparesis;Type I diabetes mellitus ;Diabetic neuropathy ; Dysrhythmia; Arthritis; Multiple allergies (11/02/2015); and Recurrent UTI (11/02/2015).   He had recent prolonged admission 12/06/15 through 01/05/16 for worsening hypoxic respiratory failure due to HCAP. He was discharged home and reports that he had done well since discharge. On 01/16/16, he spiked fever to 102 and mother reports that she suctioned thick yellow sputum from pt's trach. On 01/17/16, temp dropped to 101F but pt still had thick sputum along with mild SOB. Due to concern for developing PNA, his mother thought it would be best to bring him to ED for further evaluation.  In ED, CXR was concerning for multifocal PNA. In addition, he was found to have UTI and anemia with Hgb 6.5. Mother reports that he has had a little blood from his sacral wounds and had a feeling that his Hgb would be on the low side.  He was initially placed on full vent support in ED, but shortly after asked to come off so that he could eat. During my exam, he is resting comfortably, has PMV in and is eating dinner without problems. He denies any chest pain, SOB, N/V/D, abd pain, myalgias. He does have persistent back pain, unchanged from baseline. He is requesting to go home.  HPI/Subjective: 4/16 afebrile overnight, A/O 4, NAD   Assessment/Plan: Acute on Chronic respiratory failure with hypoxia  - s/p tracheostomy.  -Respiratory status significantly improved, still obtaining  minimal purulent yellow/green sputum from trach with bronchodilator and physiotherapy vest..  Sepsis unspecified organism/HCAP w/ Probable Tracheobronchitis:  -Tracheal aspirate thick purulent greenish yellow color consistent with Pseudomonas. Cultures pending.Hx MDR pseudomonas and MRSA PNA. -Continue Empiric Vancomycin, Cefepime; complete 7 days of treatment on 4/17 -Continue PMV as tolerated. -Full vent support as needed only. -Albuterol. QID -Physiotherapy vest BID -NTS  BID. -Decrease Solu-Medrol 20 mg BID -NCM Mariane Masters looking into obtaining physiotherapy vest for home  Funguria/ positive Yeast: -Though possibly colonization, strongly positive and increasing leukocytosis and a very frail patient. Start Diflucan 200 mg daily for 14 days of treatment.   Multiple decubitus ulcers -Spoke with PT do not believe hydrotherapy would be of benefit.  -Will discuss placement of wound VAC vs Aquacel foam dressing; patient would like to also speak to wound care before making decision. Consulted wound care awaiting recommendation -Reminded patient to allow staff to frequently return him to ensure minimal pressure on ulcers.  Chronic Hypotension  - Midodrine 20 mg TID -4/15 Decreased Solucortef 5 mg  BID. Patient's BP now more controlled will hold at this level as patient also has history of hypotension   Hyponatremia. -Resolved   Acute blood loss anemia? vs Anemia Chronic disease  - 4/12 Transfuse 1u PRBC.  Gastroparesis.  Nutrition. -Glucerna 1.2 @ 157ml/hr? -Pre-albumin LOW 10.3 -Patient heart healthy/carb modified diet  DM. Type I -Increase Lantus 28 units. Patient now consuming PO  -Increase to resistant SSI. -NovoLog 6 units q 4hrs to cover tube feedings -Hemoglobin A1c pending -Lipid panel; within ADA guidelines  Hyperkalemia -Kayexalate 45 gm -Repeat potassium 1600  Hypomagnesemia -Magnesium IV  3 gm -Magnesium IV 3 gm - Repeat magnesium 1600      Code  Status: FULL Family Communication: None present at time of exam Disposition Plan: Resolution sepsis; need better discharge plan as patient is in hospital q month    Consultants: PCCM  Procedure/Significant Events: NA   Culture 4/11 blood right forearm/left AC NGTD 4/11 urine positive multiple species 4/11 MRSA by PCR positive 4/13 urine positive yeast 4/13 Tracheal Aspirate pending   Antibiotics: Cefepime 4/11>>  Vancomycin4/11>>  Diflucan 4/16>>  DVT prophylaxis: Subcutaneous heparin   Devices Tracheostomy in place   LINES / TUBES:  NA    Continuous Infusions: . sodium chloride Stopped (01/19/16 0800)  . feeding supplement (GLUCERNA 1.2 CAL) 1,000 mL (01/22/16 1212)    Objective: VITAL SIGNS: Temp: 97.6 F (36.4 C) (04/16 1200) Temp Source: Axillary (04/16 1200) BP: 106/77 mmHg (04/16 1600) Pulse Rate: 86 (04/16 1600) SPO2; FIO2:   Intake/Output Summary (Last 24 hours) at 01/22/16 1719 Last data filed at 01/22/16 1600  Gross per 24 hour  Intake   5160 ml  Output   4275 ml  Net    885 ml     Exam: General: A/O 4, sitting up in bed Negative acute respiratory distress (requires Occasional deep suctioning) Eyes: negative scleral hemorrhage ENT: Negative Runny nose, negative gingival bleeding, Neck:  Negative scars, masses, torticollis, lymphadenopathy, JVD Lungs: clear to auscultation bilateral  Cardiovascular: Regular rhythm and rate without murmur gallop or rub normal S1 and S2 Abdomen:negative abdominal pain, nondistended, positive soft, bowel sounds, no rebound, no ascites, no appreciable mass, ostomy bag in place LUQ with stool Skin; 3 large decubitus ulcers bilateral hip and sacrum to the bone, with some dead tissue visible. Extremities: No significant cyanosis, clubbing, or edema bilateral lower extremities, bilateral foot lacerations in various states of healing Psychiatric:  Negative depression, negative anxiety, negative fatigue,  negative mania Neurologic:  Cranial nerves II through XII intact, tongue/uvula midline, quadriplegic    Data Reviewed: Basic Metabolic Panel:  Recent Labs Lab 01/18/16 0519  01/19/16 0246 01/19/16 1455 01/20/16 0318 01/21/16 0331 01/22/16 0339 01/22/16 0750  NA 128*  < > 133* 137 137 138 134*  --   K 5.1  < > 4.1 4.0 4.9 5.5* 7.1* 6.1*  CL 98*  < > 104 107 106 106 102  --   CO2 11*  < > 17* 19* 20* 22 23  --   GLUCOSE 539*  < > 113* 173* 318* 259* 235*  --   BUN 48*  < > 54* 46* 42* 42* 43*  --   CREATININE 1.44*  < > 1.15 0.83 0.82 0.62 0.61  --   CALCIUM 8.9  < > 9.0 9.0 9.3 9.3 9.4  --   MG 1.7  --   --   --  1.8 1.6* 1.6* 1.6*  PHOS 6.3*  --   --   --  2.7  --   --   --   < > = values in this interval not displayed. Liver Function Tests:  Recent Labs Lab 01/17/16 1645 01/19/16 0246 01/20/16 0318 01/21/16 0331 01/22/16 0339  AST 18 14* 14* 16 22  ALT 15* 14* 14* 13* 17  ALKPHOS 125 134* 119 104 106  BILITOT 0.5 0.3 0.4 0.4 0.4  PROT 6.6 7.1 7.1 6.9 6.9  ALBUMIN 2.2* 2.3* 2.2* 2.1* 2.3*   No results for input(s): LIPASE, AMYLASE in the last 168 hours.  Recent Labs Lab 01/19/16 0246  AMMONIA 49*   CBC:  Recent Labs Lab 01/17/16 1645 01/18/16 0519 01/19/16 0246 01/20/16 0318 01/21/16 0331 01/22/16 0339  WBC 27.8* 29.7* 34.6* 23.7* 20.3* 25.2*  NEUTROABS 25.6*  --   --  21.6* 15.3* 20.4*  HGB 6.5* 7.4* 8.0* 8.5* 8.3* 9.2*  HCT 20.3* 23.5* 25.5* 26.7* 26.2* 29.9*  MCV 84.9 86.4 82.3 82.4 84.8 86.2  PLT 320 322 358 406* 393 456*   Cardiac Enzymes: No results for input(s): CKTOTAL, CKMB, CKMBINDEX, TROPONINI in the last 168 hours. BNP (last 3 results) No results for input(s): BNP in the last 8760 hours.  ProBNP (last 3 results) No results for input(s): PROBNP in the last 8760 hours.  CBG:  Recent Labs Lab 01/22/16 0135 01/22/16 0522 01/22/16 0841 01/22/16 1221 01/22/16 1514  GLUCAP 230* 292* 276* 209* 88    Recent Results (from the  past 240 hour(s))  Blood culture (routine x 2)     Status: None (Preliminary result)   Collection Time: 01/17/16  5:00 PM  Result Value Ref Range Status   Specimen Description BLOOD RIGHT FOREARM  Final   Special Requests BOTTLES DRAWN AEROBIC AND ANAEROBIC 5CC  Final   Culture NO GROWTH 4 DAYS  Final   Report Status PENDING  Incomplete  Blood culture (routine x 2)     Status: None (Preliminary result)   Collection Time: 01/17/16  5:15 PM  Result Value Ref Range Status   Specimen Description BLOOD LEFT ANTECUBITAL  Final   Special Requests IN PEDIATRIC BOTTLE 1CC  Final   Culture NO GROWTH 4 DAYS  Final   Report Status PENDING  Incomplete  Urine culture     Status: None   Collection Time: 01/17/16  7:32 PM  Result Value Ref Range Status   Specimen Description URINE, CATHETERIZED  Final   Special Requests Normal  Final   Culture MULTIPLE SPECIES PRESENT, SUGGEST RECOLLECTION  Final   Report Status 01/19/2016 FINAL  Final  MRSA PCR Screening     Status: Abnormal   Collection Time: 01/17/16 11:50 PM  Result Value Ref Range Status   MRSA by PCR POSITIVE (A) NEGATIVE Final    Comment:        The GeneXpert MRSA Assay (FDA approved for NASAL specimens only), is one component of a comprehensive MRSA colonization surveillance program. It is not intended to diagnose MRSA infection nor to guide or monitor treatment for MRSA infections. RESULT CALLED TO, READ BACK BY AND VERIFIED WITH: MELVIN,RN ON 2H @0311  01/18/16 MKELLYM   Culture, respiratory (NON-Expectorated)     Status: None (Preliminary result)   Collection Time: 01/19/16  3:23 PM  Result Value Ref Range Status   Specimen Description TRACHEAL ASPIRATE  Final   Special Requests NONE  Final   Gram Stain   Final    ABUNDANT WBC PRESENT, PREDOMINANTLY PMN FEW SQUAMOUS EPITHELIAL CELLS PRESENT FEW GRAM POSITIVE COCCI IN PAIRS Performed at [facility]    Culture   Final    FEW GRAM NEGATIVE RODS Performed at  [facility]    Report Status PENDING  Incomplete  Culture, Urine     Status: Abnormal   Collection Time: 01/19/16  6:41 PM  Result Value Ref Range Status   Specimen Description URINE, CATHETERIZED  Final   Special Requests NONE  Final   Culture >=100,000 COLONIES/mL YEAST (A)  Final   Report Status 01/21/2016 FINAL  Final     Studies:  Recent x-ray studies have been reviewed  in detail by the Attending Physician  Scheduled Meds:  Scheduled Meds: . antiseptic oral rinse  7 mL Mouth Rinse q12n4p  . baclofen  20 mg Oral QID  . buPROPion  150 mg Oral Daily  . ceFEPime (MAXIPIME) IV  1 g Intravenous 3 times per day  . chlorhexidine  15 mL Mouth Rinse BID  . Chlorhexidine Gluconate Cloth  6 each Topical Q0600  . docusate  100 mg Oral BID  . famotidine (PEPCID) IV  20 mg Intravenous 3 times per day  . fentaNYL  50 mcg Transdermal Q72H  . ferrous sulfate  325 mg Oral Q breakfast  . fluconazole  200 mg Oral Daily  . heparin  5,000 Units Subcutaneous 3 times per day  . hydrocortisone  5 mg Oral BID  . insulin aspart  0-20 Units Subcutaneous 6 times per day  . insulin aspart  6 Units Subcutaneous Q4H  . insulin glargine  28 Units Subcutaneous Daily  . loratadine  10 mg Oral Daily  . magnesium oxide  400 mg Oral BID  . methylPREDNISolone (SOLU-MEDROL) injection  20 mg Intravenous Q12H  . midodrine  20 mg Oral TID WC  . mupirocin ointment  1 application Nasal BID  . pregabalin  100 mg Per Tube TID  . vancomycin  750 mg Intravenous Q24H    Time spent on care of this patient: 40 mins   Kai Calico, Geraldo Docker , MD  [facility] Office  [phone] Pager [phone]  On-Call/Text Page:      [email or web address]      password [email or web address]  If 7PM-7AM, please contact night-coverage [email or web address] Password [email or web address] 01/22/2016, 5:19 PM   LOS: 5 days   Care during the described time interval was provided by me .  I have reviewed this patient's available data, including medical history,  events of note, physical examination, and all test results as part of my evaluation. I have personally reviewed and interpreted all radiology studies.   Dia Crawford, MD [phone] Pager

## 2016-01-22 NOTE — Progress Notes (Signed)
Pt. Refused cpt at this time. RT will continue to monitor pt.

## 2016-01-22 NOTE — Progress Notes (Signed)
CRITICAL VALUE ALERT  Critical value received:  Potassium 7.1  Date of notification:  01/22/2016  Time of notification:  [id number]  Critical value read back:Yes.    Nurse who received alert:  Carin Primrose RN  MD notified (1st page):  Chaney Malling NP  Time of first page:  0430  Responding MD:  Chaney Malling  Time MD responded:  [phone]  New orders received for calcium gluconate, dextrose 50% and novolog 10 units. **Per Schorr, okay to give this insulin as well as previously ordered sliding scale insulin due at this time.

## 2016-01-23 DIAGNOSIS — Z936 Other artificial openings of urinary tract status: Secondary | ICD-10-CM

## 2016-01-23 DIAGNOSIS — J9601 Acute respiratory failure with hypoxia: Secondary | ICD-10-CM

## 2016-01-23 DIAGNOSIS — E108 Type 1 diabetes mellitus with unspecified complications: Secondary | ICD-10-CM

## 2016-01-23 DIAGNOSIS — A498 Other bacterial infections of unspecified site: Secondary | ICD-10-CM | POA: Diagnosis present

## 2016-01-23 DIAGNOSIS — Z1624 Resistance to multiple antibiotics: Secondary | ICD-10-CM

## 2016-01-23 DIAGNOSIS — J151 Pneumonia due to Pseudomonas: Secondary | ICD-10-CM

## 2016-01-23 DIAGNOSIS — IMO0002 Reserved for concepts with insufficient information to code with codable children: Secondary | ICD-10-CM | POA: Diagnosis present

## 2016-01-23 DIAGNOSIS — L8994 Pressure ulcer of unspecified site, stage 4: Secondary | ICD-10-CM

## 2016-01-23 DIAGNOSIS — D638 Anemia in other chronic diseases classified elsewhere: Secondary | ICD-10-CM | POA: Diagnosis present

## 2016-01-23 DIAGNOSIS — I959 Hypotension, unspecified: Secondary | ICD-10-CM | POA: Diagnosis present

## 2016-01-23 DIAGNOSIS — L89899 Pressure ulcer of other site, unspecified stage: Secondary | ICD-10-CM

## 2016-01-23 DIAGNOSIS — L89159 Pressure ulcer of sacral region, unspecified stage: Secondary | ICD-10-CM

## 2016-01-23 DIAGNOSIS — Z960 Presence of urogenital implants: Secondary | ICD-10-CM

## 2016-01-23 DIAGNOSIS — D62 Acute posthemorrhagic anemia: Secondary | ICD-10-CM | POA: Diagnosis present

## 2016-01-23 DIAGNOSIS — B49 Unspecified mycosis: Secondary | ICD-10-CM

## 2016-01-23 DIAGNOSIS — E1065 Type 1 diabetes mellitus with hyperglycemia: Secondary | ICD-10-CM | POA: Diagnosis present

## 2016-01-23 DIAGNOSIS — J9610 Chronic respiratory failure, unspecified whether with hypoxia or hypercapnia: Secondary | ICD-10-CM

## 2016-01-23 DIAGNOSIS — G8250 Quadriplegia, unspecified: Secondary | ICD-10-CM

## 2016-01-23 DIAGNOSIS — Y95 Nosocomial condition: Secondary | ICD-10-CM

## 2016-01-23 LAB — BASIC METABOLIC PANEL
Anion gap: 9 (ref 5–15)
BUN: 43 mg/dL — ABNORMAL HIGH (ref 6–20)
CALCIUM: 9.9 mg/dL (ref 8.9–10.3)
CHLORIDE: 102 mmol/L (ref 101–111)
CO2: 23 mmol/L (ref 22–32)
CREATININE: 0.77 mg/dL (ref 0.61–1.24)
GFR calc non Af Amer: >60 mL/min (ref >60)
GLUCOSE: 404 mg/dL — AB (ref 65–99)
Potassium: 5.8 mmol/L — ABNORMAL HIGH (ref 3.5–5.1)
Sodium: 134 mmol/L — ABNORMAL LOW (ref 135–145)

## 2016-01-23 LAB — COMPREHENSIVE METABOLIC PANEL
ALBUMIN: 2.5 g/dL — AB (ref 3.5–5.0)
ALK PHOS: 114 U/L (ref 38–126)
ALT: 18 U/L (ref 17–63)
ANION GAP: 10 (ref 5–15)
AST: 17 U/L (ref 15–41)
BUN: 41 mg/dL — ABNORMAL HIGH (ref 6–20)
CALCIUM: 9.6 mg/dL (ref 8.9–10.3)
CO2: 24 mmol/L (ref 22–32)
Chloride: 100 mmol/L — ABNORMAL LOW (ref 101–111)
Creatinine, Ser: 0.68 mg/dL (ref 0.61–1.24)
GFR calc Af Amer: >60 mL/min (ref >60)
GFR calc non Af Amer: >60 mL/min (ref >60)
GLUCOSE: 273 mg/dL — AB (ref 65–99)
POTASSIUM: 7.2 mmol/L — AB (ref 3.5–5.1)
SODIUM: 134 mmol/L — AB (ref 135–145)
Total Bilirubin: 0.4 mg/dL (ref 0.3–1.2)
Total Protein: 7.3 g/dL (ref 6.5–8.1)

## 2016-01-23 LAB — CBC WITH DIFFERENTIAL/PLATELET
BASOS PCT: 0 %
Basophils Absolute: 0 10*3/uL (ref 0.0–0.1)
EOS PCT: 0 %
Eosinophils Absolute: 0 10*3/uL (ref 0.0–0.7)
HEMATOCRIT: 30.2 % — AB (ref 39.0–52.0)
HEMOGLOBIN: 9.2 g/dL — AB (ref 13.0–17.0)
LYMPHS ABS: 2 10*3/uL (ref 0.7–4.0)
Lymphocytes Relative: 8 %
MCH: 26.6 pg (ref 26.0–34.0)
MCHC: 30.5 g/dL (ref 30.0–36.0)
MCV: 87.3 fL (ref 78.0–100.0)
MONO ABS: 3 10*3/uL — AB (ref 0.1–1.0)
MONOS PCT: 12 %
NEUTROS ABS: 19.6 10*3/uL — AB (ref 1.7–7.7)
Neutrophils Relative %: 80 %
Platelets: 501 10*3/uL — ABNORMAL HIGH (ref 150–400)
RBC: 3.46 MIL/uL — AB (ref 4.22–5.81)
RDW: 19.6 % — AB (ref 11.5–15.5)
WBC: 24.6 10*3/uL — AB (ref 4.0–10.5)

## 2016-01-23 LAB — CULTURE, RESPIRATORY

## 2016-01-23 LAB — CULTURE, RESPIRATORY W GRAM STAIN

## 2016-01-23 LAB — GLUCOSE, CAPILLARY
GLUCOSE-CAPILLARY: 325 mg/dL — AB (ref 65–99)
Glucose-Capillary: 256 mg/dL — ABNORMAL HIGH (ref 65–99)
Glucose-Capillary: 395 mg/dL — ABNORMAL HIGH (ref 65–99)
Glucose-Capillary: 94 mg/dL (ref 65–99)
Glucose-Capillary: 148 mg/dL — ABNORMAL HIGH (ref 65–99)

## 2016-01-23 LAB — MAGNESIUM: Magnesium: 2 mg/dL (ref 1.7–2.4)

## 2016-01-23 LAB — HEMOGLOBIN A1C
Hgb A1c MFr Bld: 7.4 % — ABNORMAL HIGH (ref 4.8–5.6)
Mean Plasma Glucose: 166 mg/dL

## 2016-01-23 MED ORDER — ONDANSETRON HCL 4 MG/2ML IJ SOLN
4.0000 mg | Freq: Four times a day (QID) | INTRAMUSCULAR | Status: DC | PRN
  Administered 2016-01-23: 4 mg via INTRAVENOUS
  Filled 2016-01-23: qty 2

## 2016-01-23 MED ORDER — INSULIN ASPART 100 UNIT/ML ~~LOC~~ SOLN: 6.0000 [IU] | SUBCUTANEOUS | Status: DC

## 2016-01-23 MED ORDER — SODIUM CHLORIDE 0.9 % IV SOLN
1.0000 g | Freq: Once | INTRAVENOUS | Status: AC
  Administered 2016-01-23: 1 g via INTRAVENOUS
  Filled 2016-01-23: qty 10

## 2016-01-23 MED ORDER — INSULIN ASPART 100 UNIT/ML ~~LOC~~ SOLN
0.0000 [IU] | SUBCUTANEOUS | Status: DC
  Administered 2016-01-23: 15 [IU] via SUBCUTANEOUS
  Administered 2016-01-23: 20 [IU] via SUBCUTANEOUS
  Administered 2016-01-24 – 2016-01-25 (×3): 4 [IU] via SUBCUTANEOUS

## 2016-01-23 MED ORDER — INSULIN ASPART 100 UNIT/ML ~~LOC~~ SOLN
6.0000 [IU] | SUBCUTANEOUS | Status: DC
  Administered 2016-01-23 – 2016-01-25 (×10): 6 [IU] via SUBCUTANEOUS

## 2016-01-23 MED ORDER — INSULIN ASPART 100 UNIT/ML ~~LOC~~ SOLN
2.0000 [IU] | SUBCUTANEOUS | Status: DC
  Administered 2016-01-23: 2 [IU] via SUBCUTANEOUS

## 2016-01-23 MED ORDER — INSULIN ASPART 100 UNIT/ML ~~LOC~~ SOLN
6.0000 [IU] | SUBCUTANEOUS | Status: DC
  Administered 2016-01-23: 6 [IU] via SUBCUTANEOUS

## 2016-01-23 MED ORDER — INSULIN ASPART 100 UNIT/ML IV SOLN
10.0000 [IU] | Freq: Once | INTRAVENOUS | Status: AC
  Administered 2016-01-23: 10 [IU] via INTRAVENOUS

## 2016-01-23 MED ORDER — NEPRO/CARBSTEADY PO LIQD
1000.0000 mL | Freq: Two times a day (BID) | ORAL | Status: DC
  Administered 2016-01-23 – 2016-01-26 (×6): 1000 mL
  Filled 2016-01-23 (×9): qty 1000

## 2016-01-23 MED ORDER — SODIUM POLYSTYRENE SULFONATE 15 GM/60ML PO SUSP
45.0000 g | Freq: Once | ORAL | Status: AC
  Administered 2016-01-23: 45 g
  Filled 2016-01-23: qty 180

## 2016-01-23 MED ORDER — DEXTROSE 50 % IV SOLN
25.0000 g | Freq: Once | INTRAVENOUS | Status: AC
  Administered 2016-01-23: 25 g via INTRAVENOUS
  Filled 2016-01-23: qty 50

## 2016-01-23 NOTE — Progress Notes (Signed)
CBG taken at 9 pm and was 229. Patient's dad was concerned about the sliding scale coverage and stated that he preferred that I did not give patient insulin at this time. He stated that the patient's blood sugar drops through the night. He requested that I retake it in a couple of hours and see if the blood sugar went down. I took it again around 11 pm and it was 354. Dad was in the room and said it was okay to give him the ordered coverage of novolog.

## 2016-01-23 NOTE — Progress Notes (Signed)
[facility] TEAM 1 - Stepdown/ICU TEAM Progress Note  Jason [name] [id number] DOB: 1986-03-03 DOA: 01/17/2016 PCP: [name], MD  Admit HPI / Brief Narrative: Jason [name] is a 30 y.o. 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 He is well known to our service from multiple prior admissions.Hx  MDR Pseudomonas and MRSA.Gastroparesis;Type I diabetes mellitus ;Diabetic neuropathy ; Dysrhythmia; Arthritis; Multiple allergies (11/02/2015); and Recurrent UTI (11/02/2015).   He had recent prolonged admission 12/06/15 through 01/05/16 for worsening hypoxic respiratory failure due to HCAP. He was discharged home and reports that he had done well since discharge. On 01/16/16, he spiked fever to 102 and mother reports that she suctioned thick yellow sputum from pt's trach. On 01/17/16, temp dropped to 101F but pt still had thick sputum along with mild SOB. Due to concern for developing PNA, his mother thought it would be best to bring him to ED for further evaluation.  In ED, CXR was concerning for multifocal PNA. In addition, he was found to have UTI and anemia with Hgb 6.5. Mother reports that he has had a little blood from his sacral wounds and had a feeling that his Hgb would be on the low side.  He was initially placed on full vent support in ED, but shortly after asked to come off so that he could eat. During my exam, he is resting comfortably, has PMV in and is eating dinner without problems. He denies any chest pain, SOB, N/V/D, abd pain, myalgias. He does have persistent back pain, unchanged from baseline. He is requesting to go home.  HPI/Subjective: 4/17 afebrile overnight, A/O 4, NAD. Patient's CBGs elevated secondary to father preventing patient from receiving scheduled insulin.   Assessment/Plan: Acute on Chronic respiratory failure  with hypoxia  - s/p tracheostomy.  -Respiratory status significantly improved, still obtaining minimal purulent yellow/green sputum from trach with bronchodilator and physiotherapy vest..  Sepsis unspecified organism/HCAP w/ Probable Tracheobronchitis:positive MDR Pseudomonas  -Tracheal aspirate thick purulent greenish yellow color c/w Pseudomonas. Culture positive MDR Pseudomonas -Completed 7 day treatment of Vancomycin, Cefepime on 4/17 for HCAP -Continue PMV as tolerated. -Full vent support as needed only. -Albuterol. QID -Physiotherapy vest BID -NTS  BID. -NCM Mariane Masters looking into obtaining physiotherapy vest for home -4/17 Consulted infectious disease secondary to patient multiple hospitalizations for similar respiratory tract infections. Will await their recommendations  Funguria/ positive Yeast: -Though probably colonization, strongly positive and increasing leukocytosis in a very frail patient. Continue Diflucan 200 mg daily for 14 days of treatment.   Multiple decubitus ulcers -Spoke with PT do not believe hydrotherapy would be of benefit.  -Reminded patient to allow staff to frequently return him to ensure minimal pressure on ulcers. -WOC recommending continue Aquacel for the present. Resume wound VAC after patient stable and discharged home.  Chronic Hypotension  - Midodrine 20 mg TID -4/17 D/C Solucortef    Hyponatremia. -Resolved   Acute blood loss anemia? vs Anemia Chronic disease  - 4/12 Transfuse 1u PRBC.  Gastroparesis.  Nutrition. -Glucerna 1.2 @ 162ml/hr -Pre-albumin LOW 10.3 -Patient heart healthy/carb modified diet -Read consulted nutrition patient's current tube feeds causing HYPERKALEMIA. Request different feed or reduced rate.  DM. Type I uncontrolled with complication -Increase Lantus 28 units. Patient now consuming PO  -Resistant SSI. -NovoLog 6 units q 4hrs to cover tube feedings -4/16 Hemoglobin A1c= 7.4 -Lipid panel; within ADA  guidelines  Hyperkalemia -  Kayexalate 45 gm -Repeat potassium 1200 : Remains elevated -Repeat Kayexalate 45 gm  Hypomagnesemia -Magnesium IV 3 gm       Code Status: FULL Family Communication: None present at time of exam Disposition Plan: Resolution sepsis; need better discharge plan as patient is in hospital q month.    Consultants: PCCM  Procedure/Significant Events: NA   Culture 4/11 blood right forearm/left AC NGTD 4/11 urine positive multiple species 4/11 MRSA by PCR positive 4/13 urine positive yeast 4/13 Tracheal Aspirate positive MDR Pseudomonas   Antibiotics: Cefepime 4/11>> 4/17 Vancomycin4/11>> 4/17  Diflucan 4/16>>  DVT prophylaxis: Subcutaneous heparin   Devices Tracheostomy in place   LINES / TUBES:  NA    Continuous Infusions: . feeding supplement (GLUCERNA 1.2 CAL) 1,000 mL (01/23/16 1040)    Objective: VITAL SIGNS: Temp: 98.5 F (36.9 C) (04/17 1558) Temp Source: Oral (04/17 1558) BP: 144/97 mmHg (04/17 1600) Pulse Rate: 89 (04/17 1600) SPO2; FIO2:   Intake/Output Summary (Last 24 hours) at 01/23/16 1634 Last data filed at 01/23/16 1600  Gross per 24 hour  Intake 3977.5 ml  Output   3975 ml  Net    2.5 ml     Exam: General: A/O 4, sitting up in bed Negative acute respiratory distress (requires Occasional deep suctioning) Eyes: negative scleral hemorrhage ENT: Negative Runny nose, negative gingival bleeding, Neck:  Negative scars, masses, torticollis, lymphadenopathy, JVD Lungs: clear to auscultation bilateral  Cardiovascular: Regular rhythm and rate without murmur gallop or rub normal S1 and S2 Abdomen:negative abdominal pain, nondistended, positive soft, bowel sounds, no rebound, no ascites, no appreciable mass, ostomy bag in place LUQ with stool Skin; 3 large decubitus ulcers bilateral hip and sacrum to the bone, with some dead tissue visible. Extremities: No significant cyanosis, clubbing, or edema bilateral  lower extremities, bilateral foot lacerations in various states of healing Psychiatric:  Negative depression, negative anxiety, negative fatigue, negative mania Neurologic:  Cranial nerves II through XII intact, tongue/uvula midline, quadriplegic    Data Reviewed: Basic Metabolic Panel:  Recent Labs Lab 01/18/16 0519  01/20/16 0318 01/21/16 0331 01/22/16 0339 01/22/16 0750 01/22/16 1802 01/23/16 0332 01/23/16 1051  NA 128*  < > 137 138 134*  --   --  134* 134*  K 5.1  < > 4.9 5.5* 7.1* 6.1* 5.9* 7.2* 5.8*  CL 98*  < > 106 106 102  --   --  100* 102  CO2 11*  < > 20* 22 23  --   --  24 23  GLUCOSE 539*  < > 318* 259* 235*  --   --  273* 404*  BUN 48*  < > 42* 42* 43*  --   --  41* 43*  CREATININE 1.44*  < > 0.82 0.62 0.61  --   --  0.68 0.77  CALCIUM 8.9  < > 9.3 9.3 9.4  --   --  9.6 9.9  MG 1.7  --  1.8 1.6* 1.6* 1.6* 2.3 2.0  --   PHOS 6.3*  --  2.7  --   --   --   --   --   --   < > = values in this interval not displayed. Liver Function Tests:  Recent Labs Lab 01/19/16 0246 01/20/16 0318 01/21/16 0331 01/22/16 0339 01/23/16 0332  AST 14* 14* 16 22 17   ALT 14* 14* 13* 17 18  ALKPHOS 134* 119 104 106 114  BILITOT 0.3 0.4 0.4 0.4 0.4  PROT 7.1 7.1 6.9  6.9 7.3  ALBUMIN 2.3* 2.2* 2.1* 2.3* 2.5*   No results for input(s): LIPASE, AMYLASE in the last 168 hours.  Recent Labs Lab 01/19/16 0246  AMMONIA 49*   CBC:  Recent Labs Lab 01/17/16 1645  01/19/16 0246 01/20/16 0318 01/21/16 0331 01/22/16 0339 01/23/16 0332  WBC 27.8*  < > 34.6* 23.7* 20.3* 25.2* 24.6*  NEUTROABS 25.6*  --   --  21.6* 15.3* 20.4* 19.6*  HGB 6.5*  < > 8.0* 8.5* 8.3* 9.2* 9.2*  HCT 20.3*  < > 25.5* 26.7* 26.2* 29.9* 30.2*  MCV 84.9  < > 82.3 82.4 84.8 86.2 87.3  PLT 320  < > 358 406* 393 456* 501*  < > = values in this interval not displayed. Cardiac Enzymes: No results for input(s): CKTOTAL, CKMB, CKMBINDEX, TROPONINI in the last 168 hours. BNP (last 3 results) No results for  input(s): BNP in the last 8760 hours.  ProBNP (last 3 results) No results for input(s): PROBNP in the last 8760 hours.  CBG:  Recent Labs Lab 01/22/16 2100 01/22/16 2254 01/23/16 0320 01/23/16 0836 01/23/16 1120  GLUCAP 229* 354* 256* 395* 325*    Recent Results (from the past 240 hour(s))  Blood culture (routine x 2)     Status: None   Collection Time: 01/17/16  5:00 PM  Result Value Ref Range Status   Specimen Description BLOOD RIGHT FOREARM  Final   Special Requests BOTTLES DRAWN AEROBIC AND ANAEROBIC 5CC  Final   Culture NO GROWTH 5 DAYS  Final   Report Status 01/22/2016 FINAL  Final  Blood culture (routine x 2)     Status: None   Collection Time: 01/17/16  5:15 PM  Result Value Ref Range Status   Specimen Description BLOOD LEFT ANTECUBITAL  Final   Special Requests IN PEDIATRIC BOTTLE 1CC  Final   Culture NO GROWTH 5 DAYS  Final   Report Status 01/22/2016 FINAL  Final  Urine culture     Status: None   Collection Time: 01/17/16  7:32 PM  Result Value Ref Range Status   Specimen Description URINE, CATHETERIZED  Final   Special Requests Normal  Final   Culture MULTIPLE SPECIES PRESENT, SUGGEST RECOLLECTION  Final   Report Status 01/19/2016 FINAL  Final  MRSA PCR Screening     Status: Abnormal   Collection Time: 01/17/16 11:50 PM  Result Value Ref Range Status   MRSA by PCR POSITIVE (A) NEGATIVE Final    Comment:        The GeneXpert MRSA Assay (FDA approved for NASAL specimens only), is one component of a comprehensive MRSA colonization surveillance program. It is not intended to diagnose MRSA infection nor to guide or monitor treatment for MRSA infections. RESULT CALLED TO, READ BACK BY AND VERIFIED WITH: MELVIN,RN ON 2H @0311  01/18/16 MKELLYM   Culture, respiratory (NON-Expectorated)     Status: None   Collection Time: 01/19/16  3:23 PM  Result Value Ref Range Status   Specimen Description TRACHEAL ASPIRATE  Final   Special Requests NONE  Final   Gram  Stain   Final    ABUNDANT WBC PRESENT, PREDOMINANTLY PMN FEW SQUAMOUS EPITHELIAL CELLS PRESENT FEW GRAM POSITIVE COCCI IN PAIRS Performed at [facility]    Culture   Final    FEW PSEUDOMONAS AERUGINOSA Note: MULTI DRUG RESISTANT ORGANISM COLISTIN 1.5ug/ML SENSITIVE ETEST results for this drug are "FOR INVESTIGATIONAL USE ONLY" and should NOT be used for clinical purposes. CRITICAL RESULT CALLED TO, READ  BACK BY AND VERIFIED WITH: [place] RN 10AM  01/23/16 GUSTK Performed at [facility]    Report Status 01/23/2016 FINAL  Final   Organism ID, Bacteria PSEUDOMONAS AERUGINOSA  Final      Susceptibility   Pseudomonas aeruginosa - MIC*    CEFEPIME RESISTANT      CEFTAZIDIME RESISTANT      CIPROFLOXACIN RESISTANT      GENTAMICIN RESISTANT      IMIPENEM RESISTANT      PIP/TAZO RESISTANT      TICAR/CLAVULANIC RESISTANT      TOBRAMYCIN RESISTANT      AMIKACIN 16 SENSITIVE Sensitive     * FEW PSEUDOMONAS AERUGINOSA  Culture, Urine     Status: Abnormal   Collection Time: 01/19/16  6:41 PM  Result Value Ref Range Status   Specimen Description URINE, CATHETERIZED  Final   Special Requests NONE  Final   Culture >=100,000 COLONIES/mL YEAST (A)  Final   Report Status 01/21/2016 FINAL  Final     Studies:  Recent x-ray studies have been reviewed in detail by the Attending Physician  Scheduled Meds:  Scheduled Meds: . antiseptic oral rinse  7 mL Mouth Rinse q12n4p  . baclofen  20 mg Oral QID  . buPROPion  150 mg Oral Daily  . chlorhexidine  15 mL Mouth Rinse BID  . docusate  100 mg Oral BID  . famotidine (PEPCID) IV  20 mg Intravenous 3 times per day  . fentaNYL  50 mcg Transdermal Q72H  . ferrous sulfate  325 mg Oral Q breakfast  . fluconazole  200 mg Oral Daily  . heparin  5,000 Units Subcutaneous 3 times per day  . insulin aspart  0-20 Units Subcutaneous 6 times per day  . insulin aspart  6 Units Subcutaneous Q4H  . insulin glargine  28 Units Subcutaneous  Daily  . loratadine  10 mg Oral Daily  . magnesium oxide  400 mg Oral BID  . methylPREDNISolone (SOLU-MEDROL) injection  20 mg Intravenous Q12H  . midodrine  20 mg Oral TID WC  . pregabalin  100 mg Per Tube TID  . sodium polystyrene  45 g Per Tube Once    Time spent on care of this patient: 40 mins   Jaz Laningham, Geraldo Docker , MD  [facility] Office  [phone] Pager - [phone]  On-Call/Text Page:      [email or web address]      password [email or web address]  If 7PM-7AM, please contact night-coverage [email or web address] Password [email or web address] 01/23/2016, 4:34 PM   LOS: 6 days   Care during the described time interval was provided by me .  I have reviewed this patient's available data, including medical history, events of note, physical examination, and all test results as part of my evaluation. I have personally reviewed and interpreted all radiology studies.   Dia [facility], MD [phone] Pager

## 2016-01-23 NOTE — Consult Note (Addendum)
WOC wound follow-up consult note Reason for Consult: Consult requested by primary team for possible Vac application to sacrum/bilat ischium wounds.  Mother is not at the bedside today to discuss the plan of care; bedside nurse states she is home sick. Refer to consult note on 4/12 for assessment, measurements, and description of sacral and bilat ischium wounds.  Vac was previously applied to sacrum and bilat ischium wounds by home health; but had to be discontinued prior to admission related to frequent loose stools from rectum which were soiling underneath the dressings and Vac was unable to maintain a seal. Pt can resume this plan of care after discharge home. Application of applying a Vac to the three stage 4 pressure injuries and applying barrier rings to maintain a seal to repel the leakage from his rectum has been a complicated process which was performed during the last admission once the patient was stable.  It takes almost an hour and he is unable to tolerate this procedure when his oxygenation status is poor. The Vac can be resumed after discharge when patient is stable and able to turn for the prolonged period of time that it requires without respiratory distress. Continue present plan of care with Aquacel packing to the wounds during this admission to absorb drainage and provide antimicrobial benifits. Please re-consult if further assistance is needed.  Thank-you,  Julien Girt MSN, [place], [place], [place], [place]

## 2016-01-23 NOTE — Progress Notes (Addendum)
Nutrition Follow-up  DOCUMENTATION CODES:   Not applicable  INTERVENTION:   -Initiate Nepro with Carb Steady @ 20 ml/hr via PEG and increase by 10 ml every 4 hours to goal rate of 80 ml/hr over 12 hour period  Tube feeding regimen provides 1728  kcal (100% of needs), 78 grams of protein, and 698 ml of H2O. Also provides 1 gram K daily (26 mEq) and 155 grams carbohydrate daily  -Enocourage PO intake as able  NUTRITION DIAGNOSIS:   Inadequate oral intake related to nausea, poor appetite as evidenced by per patient/family report, percent weight loss.  Ongoing   GOAL:   Patient will meet greater than or equal to 90% of their needs  Progressing  MONITOR:   PO intake, Labs, TF tolerance, I & O's, Skin  REASON FOR ASSESSMENT:   Malnutrition Screening Tool, Low [place]aden    ASSESSMENT:   Jason [name] is a 30 y.o. 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 He is well known to our service from multiple prior admissions. He has hx of MDR pseudomonas and MRSA. He had recent prolonged admission 12/06/15 through 01/05/16 for worsening hypoxic respiratory failure due to HCAP. He was discharged home and reports that he had done well since discharge.  Pt transitioned to trach collar.   Pt sitting up in bed, talking on phone at time of visit. Nursing student with pt, helping pt set up lunch tray, which pt just received prior to RD visit. Noted pt also with can of Pringles of bedside table. Meal completion 15-25%.   Pt continues to receive TF every 12 hours via PEG- Glucerna 1.2 @ 110 ml.hr, which provides 1600 kcals, 80 grams protein, and 1073 ml fluid daily (which meets 100% of estimated kcal needs and 89% of estimated protein needs). RD received consult to assess TF regimen, due to pt's chronic hyperkalemia and MD is requesting formula change. Noted that pt is  receiving 2.4 grams K daily (625.68 mEq) and 151 grams carbohydrate daily with current TF regimen.   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   Reviewed physical therapy note from 01/20/16; wounds are clean with good granulation tissue and do not recommend hydrotherapy treatments. Per CWOCN note on 01/23/16, recommending home wound vac on discharge. Wound vac is difficult to maintain seal, due to location of wounds.   Labs reviewed: Na: 134, K: 5.8, Glucose: 404, CBGS: 256-395.   Diet Order:  Diet heart healthy/carb modified Room service appropriate?: Yes; Fluid consistency:: Thin  Skin:  Wound (see comment) (stage IV rt/lt ischium and sacrum, FT lt plantar and outer )  Last BM:  01/23/16  Height:   Ht Readings from Last 1 Encounters:  01/18/16 5\' 8"  (1.727 m)    Weight:   Wt Readings from Last 1 Encounters:  01/23/16 90 lb (40.824 kg)    Ideal Body Weight:  70 kg  BMI:  Body mass index is 13.69 kg/(m^2).  Estimated Nutritional Needs:   Kcal:  1600-1800  Protein:  90-110 gm  Fluid:  >/= 1.7L  EDUCATION NEEDS:   No education needs identified at this time  Aiyannah Fayad [name], RD, LDN, CDE Pager: [phone] After hours Pager: [phone]

## 2016-01-23 NOTE — Progress Notes (Signed)
pt refused chest vest

## 2016-01-23 NOTE — Progress Notes (Signed)
Inpatient Diabetes Program Recommendations  AACE/ADA: New Consensus Statement on Inpatient Glycemic Control (2015)  Target Ranges:  Prepandial:   less than 140 mg/dL      Peak postprandial:   less than 180 mg/dL (1-2 hours)      Critically ill patients:  140 - 180 mg/dL   Review of Glycemic Control Results for Jason [name], Jason [name] (MRN [id number]) as of 01/23/2016 11:30  Ref. Range 01/22/2016 17:17 01/22/2016 21:00 01/22/2016 22:54 01/23/2016 03:20 01/23/2016 08:36  Glucose-Capillary Latest Ref Range: 65-99 mg/dL 84 229 (H) 354 (H) 256 (H) 395 (H)  Outpatient diabetes meds:Lantus 15 units q HS, Novolog correction starting at 201 mg/dL Current meds: Lantus 28 units daily + Novolog 6 units q 4 hrs. Tube feed coverage + Novolog correction scale resistant 0-20 q 4 hrs.  Inpatient Diabetes Program Recommendations: Please consider Increase in tubefeeding coverage (by 2 units) to Novolog 8 units q 4 hrs. (hold if tube feedings stopped).  Thank you, Nani Gasser. Mattheo Swindle, RN, MSN, CDE Inpatient Glycemic Control Team Team Pager [phone] (8am-5pm) 01/23/2016 11:32 AM

## 2016-01-23 NOTE — Progress Notes (Signed)
CRITICAL VALUE ALERT  Critical value received:  Tracheal aspirate pseudomonas originosa Date of notification:  01/23/2016  Time of notification:  1011  Critical value read back:Yes.    Nurse who received alert:  Carleene Cooper RN  MD notified (1st page):  Dia Crawford  Time of first page:  47  MD notified (2nd page):  Time of second page:  Responding MD:  Dia Crawford Time MD responded:  1020

## 2016-01-23 NOTE — Progress Notes (Signed)
Name: Jason [name] MRN: [id number] DOB: Jul 30, 1986    ADMISSION DATE:  01/17/2016 CONSULTATION DATE:  01/17/16  REFERRING MD :  EDP  CHIEF COMPLAINT:  SOB   HISTORY OF PRESENT ILLNESS:  Jason [name] is a 30 y.o. male well known to our service from multiple prior admissions.  He has hx of MDR pseudomonas and MRSA.  He had recent prolonged admission 12/06/15 through 01/05/16 for worsening hypoxic respiratory failure due to HCAP.  On 01/16/16, he spiked fever to 102 and mother reports that she suctioned thick yellow sputum from pt's trach. He was admitted for multifocal PNA He was initially placed on full vent support in ED, but shortly after asked to come off so that he could eat.  During my exam, he is resting comfortably, has PMV in and is eating dinner without problems.  He denies any chest pain, SOB, N/V/D, abd pain, myalgias.  He does have persistent back pain, unchanged from baseline.  He is requesting to go home.  SUBJECTIVE:  Tolerating ATC well. Thinks his cough is improving, breathing better. Tracheal aspirate with pseudomonas. Hyperkalemic overnight managed with temporizing measures and kayexalate by primary team.  VITAL SIGNS: Temp:  [97.6 F (36.4 C)-98.3 F (36.8 C)] 98.1 F (36.7 C) (04/17 0837) Pulse Rate:  [77-97] 88 (04/17 0900) Resp:  [14-29] 17 (04/17 0900) BP: (98-149)/(69-97) 104/70 mmHg (04/17 0900) SpO2:  [95 %-100 %] 97 % (04/17 0900) FiO2 (%):  [28 %] 28 % (04/17 0755) Weight:  [40.824 kg (90 lb)] 40.824 kg (90 lb) (04/17 0500)  PHYSICAL EXAMINATION: General: Young adult AA male, lethargic but arousble on the vent. Neuro: A&O x 3, non-focal.  HEENT: [place]/AT. PERRL, sclerae anicteric. Trach site clean. Cardiovascular: RRR, no M/R/G.  Lungs: Respirations even and unlabored.  Coarse bilateral bases R > L. Abdomen: BS x 4, soft, NT/ND.  Musculoskeletal: Muscle wasting throughout, bilateral ankles in pressure relief boots. Skin: Intact, warm, no  rashes.  Recent Labs Lab 01/21/16 0331 01/22/16 0339 01/22/16 0750 01/22/16 1802 01/23/16 0332  NA 138 134*  --   --  134*  K 5.5* 7.1* 6.1* 5.9* 7.2*  CL 106 102  --   --  100*  CO2 22 23  --   --  24  BUN 42* 43*  --   --  41*  CREATININE 0.62 0.61  --   --  0.68  GLUCOSE 259* 235*  --   --  273*    Recent Labs Lab 01/21/16 0331 01/22/16 0339 01/23/16 0332  HGB 8.3* 9.2* 9.2*  HCT 26.2* 29.9* 30.2*  WBC 20.3* 25.2* 24.6*  PLT 393 456* 501*   No results found. STUDIES:  CXR 04/11 > multifocal PNA.  CULTURES: Blood 04/11 >NTD Urine 04/11 >NTD Sputum 04/11 >Pseudomonas MDRO, only sensitive to Amikacin  ANTIBIOTICS: Vanc 04/11 >  Cefepime 04/11 > Diflucan 04/11 >  SIGNIFICANT EVENTS  04/11 > admitted with HCAP.  LINES / TUBES: Chronic trach / PEG >   ASSESSMENT / PLAN:  Chronic respiratory failure - s/p tracheostomy.  Tolerating ATC and PMV fine today HCAP > few Pseudomonas - MDRO Tracheostomy status. Hx MDR pseudomonas and MRSA PNA Plan: Continue trach collar as prescribed Continue outpatient albuterol. Pulmonary hygiene. ABX as above CXR in AM.  HCAP with probable tracheitis. UTI - also with yeast. Plan: Continue empiric abx - Recommend ID consultation for best ABX therapy Follow cultures. Routine wound care with santyl dressings, defer to wound care.  Chronic hypotension -  on midodrine and was on solucortef last admission which was gradually being weaned following discharge. Plan: Continue outpatient midodrine, cortef. Monitor hemodynamics.  Hyperkalemia Hyponatremia - chronic. Plan: Per primary  AoC anemia - per mother, pt has had some mild blood in his sacral wounds. VTE prophylaxis. Plan: Heparin SQ. CBC in AM.  Hx GERD, gastroparesis. Nutrition. Plan: Per primary  DM. Plan: Per primary  Hx chronic pain, diabetic neuropathy, fibromyalgia, anxiety, paraplegia, seizures (not on meds). Plan: Per primary  Family  updates:  Patient updated bedside  Georgann Housekeeper, AGACNP-BC [facility] Pulmonology/Critical Care Pager [phone] or [phone]  01/23/2016 10:44 AM   Attending:  I have seen and examined the patient with all [place] and I agree with the findings from his note  No acute events Remains on trach collar  On exam: Talking on the phone, not interacting with me Lungs clear Tracheostomy clean and dry  Chronic respiratory failure: He seems to be doing well with trach collar over the last several days. However, I do wonder how long this will last as she has continued to need the ventilator at night. At this point I would continue nocturnal ventilatory support if he develops hypercapnia again, but for now continue trach collar.  Pseudomonas tracheitis> would discuss with ID  Roselie Awkward, MD [place] PCCM Pager: [phone] Cell: [phone] After 3pm or if no response, call [phone]

## 2016-01-23 NOTE — Consult Note (Addendum)
[facility]  Date of Admission:  01/17/2016  Date of Consult:  01/23/2016  Reason for Consult: HCAP Referring Physician: Sherral Hammers  Impression/Recommendation HCAP- pseudomonas (S- amikacin, colistin) quadraplagia Multiple (sacral, foot) decubiti Funguria  Would Stop his atbx as you have pulmonary toilet Stop diflucan  Comment Was asked to comment on what we could give pt to clear these infections from his bladder and lungs. I explained to pt and his father that we are unable to do this due to presence of catheter and trach. That he will most likely be colonized. We will need to treat him on an as needed basis. Good pulmonary toilet (likely what caused him improvement this admission as his isolate was insensitive to his atbx) will be key  Available as needed.    Thank you so much for this interesting consult,   [name] (pager) [phone] [email or web address]  Jason [name] is an 30 y.o. male.  HPI: 30 yo M with hx of "spinal stroke" and resultant quadraplegia 2015.  He has since had admissions for UTI and last month was in with pseudomonas HCAP. He was treated with colistin then cefepime.   He returns on 4-11 with fever and thickened resp secretions from his trach at home. His CXR was read as multifocal pneumonia. His WBC was 27.8.  He as started on vanco/cefepime/diflucan. He was also started on steroids.  He completed 7 days of  Vanco, then cefepime stopped on 4-17. He is planned to get 2 weeks of diflucan for his funguria.    Past Medical History  Diagnosis Date  . GERD (gastroesophageal reflux disease)   . Asthma   . Hx MRSA infection     on face  . Gastroparesis   . Diabetic neuropathy ([place])   . Seizures ([place])   . Family history of anesthesia complication     Pt mother can't have epidural procedures  . Dysrhythmia   . Pneumonia   . Arthritis   . Fibromyalgia   . Stroke ([place]) 01/29/2014    spinal stroke ; "quadriplegic  since"  . Type I diabetes mellitus ([place])     sees Dr. Loanne Drilling   . Syncope 02/16/2015  . Multiple allergies 11/02/2015  . Recurrent UTI 11/02/2015    Past Surgical History  Procedure Laterality Date  . Tonsillectomy    . Multiple extractions with alveoloplasty N/A 08/03/2014    Procedure: MULTIPLE EXTRACTIONS;  Surgeon: Gae Bon, DDS;  Location: [facility] [place];  Service: Oral Surgery;  Laterality: N/A;  . Tee without cardioversion N/A 08/17/2014    Procedure: TRANSESOPHAGEAL ECHOCARDIOGRAM (TEE);  Surgeon: Dorothy Spark, MD;  Location: [facility] ENDOSCOPY;  Service: Cardiovascular;  Laterality: N/A;  . Debridment of decubitus ulcer N/A 10/04/2014    Procedure: DEBRIDMENT OF DECUBITUS ULCER;  Surgeon: Georganna Skeans, MD;  Location: [place];  Service: General;  Laterality: N/A;  . Laparoscopic diverted colostomy N/A 10/12/2014    Procedure: LAPAROSCOPIC DIVERTING COLOSTOMY;  Surgeon: Donnie Mesa, MD;  Location: [place];  Service: General;  Laterality: N/A;  . Insertion of suprapubic catheter N/A 10/12/2014    Procedure: INSERTION OF SUPRAPUBIC CATHETER;  Surgeon: Reece Packer, MD;  Location: [place];  Service: Urology;  Laterality: N/A;  . Incision and drainage of wound N/A 11/12/2014    Procedure: IRRIGATION AND DEBRIDEMENT OF WOUNDS WITH BONE BIOPSY AND SURGICAL PREP ;  Surgeon: Theodoro Kos, DO;  Location: [place];  Service: Plastics;  Laterality: N/A;  . Application of  a-cell of back N/A 11/12/2014    Procedure: APPLICATION A CELL AND VAC ;  Surgeon: Theodoro Kos, DO;  Location: [place];  Service: Plastics;  Laterality: N/A;  . Incision and drainage of wound N/A 11/18/2014    Procedure: IRRIGATION AND DEBRIDEMENT OF SACRAL ULCER ONLY WITH PLACEMENT OF A CELL AND VAC/ DRESSING CHANGE TO UPPER BACK AREA.;  Surgeon: Theodoro Kos, DO;  Location: [place];  Service: Plastics;  Laterality: N/A;  . Incision and drainage of wound N/A 11/25/2014    Procedure: IRRIGATION AND DEBRIDEMENT OF SACRAL ULCER AND BACK BURN  WITH PLACEMENT OF A-CELL;  Surgeon: Theodoro Kos, DO;  Location: [place];  Service: Plastics;  Laterality: N/A;  . Minor application of wound vac N/A 11/25/2014    Procedure:  WOUND VAC CHANGE;  Surgeon: Theodoro Kos, DO;  Location: [place];  Service: Plastics;  Laterality: N/A;  . Incision and drainage of wound Right 12/08/2014    Procedure: IRRIGATION AND DEBRIDEMENT SACRAL WOUND AND RIGHT ISCHIAL WOUND ;  Surgeon: Theodoro Kos, DO;  Location: [place];  Service: Plastics;  Laterality: Right;  . Application of a-cell of back N/A 12/08/2014    Procedure: APPLICATION OF A-CELL AND WOUND VAC ;  Surgeon: Theodoro Kos, DO;  Location: [place];  Service: Plastics;  Laterality: N/A;  . Tee without cardioversion N/A 05/05/2015    Procedure: TRANSESOPHAGEAL ECHOCARDIOGRAM (TEE);  Surgeon: Lelon Perla, MD;  Location: [place];  Service: Cardiovascular;  Laterality: N/A;  . Incision and drainage of wound Bilateral 05/05/2015    Procedure: IRRIGATION AND DEBRIDEMENT SACRAL ULCER;  Surgeon: Theodoro Kos, DO;  Location: [place];  Service: Plastics;  Laterality: Bilateral;  . Hematoma evacuation N/A 05/05/2015    Procedure: EVACUATION HEMATOMA bedside procedure;  Surgeon: Theodoro Kos, DO;  Location: [place];  Service: Plastics;  Laterality: N/A;  . Incision and drainage of wound N/A 08/04/2015    Procedure: IRRIGATION AND DEBRIDEMENT OF SACRAL ULCER AND ;  Surgeon: Loel Lofty Dillingham, DO;  Location: [place];  Service: Plastics;  Laterality: N/A;  . Application of a-cell of chest/abdomen N/A 08/04/2015    Procedure: APPLICATION OF A-CELL ;  Surgeon: Loel Lofty Dillingham, DO;  Location: [place];  Service: Plastics;  Laterality: N/A;     Allergies  Allergen Reactions  . Cefuroxime Axetil Anaphylaxis    Tolerated cefepime 12/2015 - with Pepcid/Solu-Medrol  . Ertapenem Other (See Comments)    Rash and confusion-->tolerated Imipenem   . Morphine And Related Other (See Comments)    Changed mental status, confusion,  headache, visual hallucination  . Penicillins Anaphylaxis and Other (See Comments)    Tolerated Imipenem; no reaction to 7 day course of amoxicillin in 2015 Has patient had a PCN reaction causing immediate rash, facial/tongue/throat swelling, SOB or lightheadedness with hypotension: Yes Has patient had a PCN reaction causing severe rash involving mucus membranes or skin necrosis: No Has patient had a PCN reaction that required hospitalization Yes Has patient had a PCN reaction occurring within the last 10 years: No If all of the above answers are "NO", then may proceed with Cephalo  . Sulfa Antibiotics Anaphylaxis, Shortness Of Breath and Other (See Comments)  . Tessalon [Benzonatate] Anaphylaxis  . Shellfish Allergy Itching and Other (See Comments)    Took benadryl to alleviate reaction  . Levaquin [Levofloxacin In D5w] Swelling    Hand and forearm swelling  . Nsaids Itching and Other (See Comments)    Risk of bleeding, itching  . Miripirium  Rash and Other (See Comments)    Change in mental status    Medications:  Scheduled: . antiseptic oral rinse  7 mL Mouth Rinse q12n4p  . baclofen  20 mg Oral QID  . buPROPion  150 mg Oral Daily  . chlorhexidine  15 mL Mouth Rinse BID  . docusate  100 mg Oral BID  . famotidine (PEPCID) IV  20 mg Intravenous 3 times per day  . feeding supplement (NEPRO CARB STEADY)  1,000 mL Per Tube Q12H  . fentaNYL  50 mcg Transdermal Q72H  . ferrous sulfate  325 mg Oral Q breakfast  . fluconazole  200 mg Oral Daily  . heparin  5,000 Units Subcutaneous 3 times per day  . insulin aspart  0-20 Units Subcutaneous 6 times per day  . insulin aspart  6 Units Subcutaneous Q4H  . insulin glargine  28 Units Subcutaneous Daily  . loratadine  10 mg Oral Daily  . magnesium oxide  400 mg Oral BID  . methylPREDNISolone (SOLU-MEDROL) injection  20 mg Intravenous Q12H  . midodrine  20 mg Oral TID WC  . pregabalin  100 mg Per Tube TID  . sodium polystyrene  45 g Per Tube  Once    Abtx:  Anti-infectives    Start     Dose/Rate Route Frequency Ordered Stop   01/22/16 1230  fluconazole (DIFLUCAN) tablet 200 mg     200 mg Oral Daily 01/22/16 1141     01/21/16 2200  ceFEPIme (MAXIPIME) 1 g in dextrose 5 % 50 mL IVPB  Status:  Discontinued     1 g 100 mL/hr over 30 Minutes Intravenous 3 times per day 01/21/16 1530 01/23/16 1506   01/21/16 2100  vancomycin (VANCOCIN) IVPB 750 mg/150 ml premix  Status:  Discontinued     750 mg 150 mL/hr over 60 Minutes Intravenous Every 24 hours 01/21/16 2014 01/23/16 1506   01/18/16 1800  vancomycin (VANCOCIN) 500 mg in sodium chloride 0.9 % 100 mL IVPB  Status:  Discontinued     500 mg 100 mL/hr over 60 Minutes Intravenous Every 24 hours 01/17/16 1925 01/21/16 2014   01/18/16 0600  ceFEPIme (MAXIPIME) 1 g in dextrose 5 % 50 mL IVPB  Status:  Discontinued     1 g 100 mL/hr over 30 Minutes Intravenous Every 12 hours 01/17/16 1925 01/21/16 1530   01/17/16 2200  fluconazole (DIFLUCAN) IVPB 200 mg  Status:  Discontinued     200 mg 100 mL/hr over 60 Minutes Intravenous Every 24 hours 01/17/16 2134 01/18/16 1124   01/17/16 1800  metroNIDAZOLE (FLAGYL) IVPB 500 mg  Status:  Discontinued     500 mg 100 mL/hr over 60 Minutes Intravenous Every 8 hours 01/17/16 1736 01/17/16 2114   01/17/16 1745  vancomycin (VANCOCIN) IVPB 1000 mg/200 mL premix     1,000 mg 200 mL/hr over 60 Minutes Intravenous  Once 01/17/16 1731 01/17/16 2020   01/17/16 1745  ceFEPIme (MAXIPIME) 2 g in dextrose 5 % 50 mL IVPB     2 g 100 mL/hr over 30 Minutes Intravenous  Once 01/17/16 1737 01/17/16 1853      Total days of antibiotics: 6 (off cefepime, vanco). Diflucan.           Social History:  reports that he quit smoking about 16 months ago. His smoking use included Cigars and Cigarettes. He has a 12 pack-year smoking history. He has never used smokeless tobacco. He reports that he does not drink alcohol or  use illicit drugs.  Family History  Problem  Relation Age of Onset  . Diabetes Father   . Hypertension Father   . Asthma      fhx  . Hypertension      fhx  . Stroke      fhx  . Heart disease Mother     General ROS: breahting back to baseline. no sputum. stool is "paste". see HPI.   Blood pressure 137/88, pulse 89, temperature 98.5 F (36.9 C), temperature source Oral, resp. rate 21, height 5' 8"  (1.727 m), weight 40.824 kg (90 lb), SpO2 97 %. General appearance: alert, cooperative and no distress Eyes: negative findings: pupils equal, round, reactive to light and accomodation Throat: normal findings: oropharynx pink & moist without lesions or evidence of thrush Neck: no adenopathy, supple, symmetrical, trachea midline and Trach site is clean.  Lungs: rhonchi bilaterally Heart: regular rate and rhythm Abdomen: normal findings: bowel sounds normal and soft, non-tender and urostomy site is clean. colostomy with partially formed stool.  Extremities: edema none. feet wrapped.    Results for orders placed or performed during the hospital encounter of 01/17/16 (from the past 48 hour(s))  Vancomycin, trough     Status: None   Collection Time: 01/21/16  6:44 PM  Result Value Ref Range   Vancomycin Tr 11 10.0 - 20.0 ug/mL  Glucose, capillary     Status: Abnormal   Collection Time: 01/21/16 10:03 PM  Result Value Ref Range   Glucose-Capillary 209 (H) 65 - 99 mg/dL   Comment 1 Capillary Specimen   Glucose, capillary     Status: Abnormal   Collection Time: 01/22/16 12:21 AM  Result Value Ref Range   Glucose-Capillary 179 (H) 65 - 99 mg/dL  Glucose, capillary     Status: Abnormal   Collection Time: 01/22/16  1:35 AM  Result Value Ref Range   Glucose-Capillary 230 (H) 65 - 99 mg/dL  CBC with Differential/Platelet     Status: Abnormal   Collection Time: 01/22/16  3:39 AM  Result Value Ref Range   WBC 25.2 (H) 4.0 - 10.5 K/uL   RBC 3.47 (L) 4.22 - 5.81 MIL/uL   Hemoglobin 9.2 (L) 13.0 - 17.0 g/dL   HCT 29.9 (L) 39.0 - 52.0 %    MCV 86.2 78.0 - 100.0 fL   MCH 26.5 26.0 - 34.0 pg   MCHC 30.8 30.0 - 36.0 g/dL   RDW 19.2 (H) 11.5 - 15.5 %   Platelets 456 (H) 150 - 400 K/uL   Neutrophils Relative % 81 %   Lymphocytes Relative 10 %   Monocytes Relative 8 %   Eosinophils Relative 0 %   Basophils Relative 1 %   Neutro Abs 20.4 (H) 1.7 - 7.7 K/uL   Lymphs Abs 2.5 0.7 - 4.0 K/uL   Monocytes Absolute 2.0 (H) 0.1 - 1.0 K/uL   Eosinophils Absolute 0.0 0.0 - 0.7 K/uL   Basophils Absolute 0.3 (H) 0.0 - 0.1 K/uL   WBC Morphology ATYPICAL LYMPHOCYTES     Comment: TOXIC GRANULATION  Comprehensive metabolic panel     Status: Abnormal   Collection Time: 01/22/16  3:39 AM  Result Value Ref Range   Sodium 134 (L) 135 - 145 mmol/L   Potassium 7.1 (HH) 3.5 - 5.1 mmol/L    Comment: NO VISIBLE HEMOLYSIS CRITICAL RESULT CALLED TO, READ BACK BY AND VERIFIED WITH: YIMBU C,RN 01/22/16 0438 WAYK    Chloride 102 101 - 111 mmol/L   CO2 23 22 -  32 mmol/L   Glucose, Bld 235 (H) 65 - 99 mg/dL   BUN 43 (H) 6 - 20 mg/dL   Creatinine, Ser 0.61 0.61 - 1.24 mg/dL   Calcium 9.4 8.9 - 10.3 mg/dL   Total Protein 6.9 6.5 - 8.1 g/dL   Albumin 2.3 (L) 3.5 - 5.0 g/dL   AST 22 15 - 41 U/L   ALT 17 17 - 63 U/L   Alkaline Phosphatase 106 38 - 126 U/L   Total Bilirubin 0.4 0.3 - 1.2 mg/dL   GFR calc non Af Amer >60 >60 mL/min   GFR calc Af Amer >60 >60 mL/min    Comment: (NOTE) The eGFR has been calculated using the CKD EPI equation. This calculation has not been validated in all clinical situations. eGFR's persistently <60 mL/min signify possible Chronic Kidney Disease.    Anion gap 9 5 - 15  Magnesium     Status: Abnormal   Collection Time: 01/22/16  3:39 AM  Result Value Ref Range   Magnesium 1.6 (L) 1.7 - 2.4 mg/dL  Hemoglobin A1c     Status: Abnormal   Collection Time: 01/22/16  3:39 AM  Result Value Ref Range   Hgb A1c MFr Bld 7.4 (H) 4.8 - 5.6 %    Comment: (NOTE)         Pre-diabetes: 5.7 - 6.4         Diabetes: >6.4          Glycemic control for adults with diabetes: <7.0    Mean Plasma Glucose 166 mg/dL    Comment: (NOTE) Performed At: [facility] [place], [place] [id number] Lindon Romp MD DQ:[id number]   Lipid panel     Status: Abnormal   Collection Time: 01/22/16  3:39 AM  Result Value Ref Range   Cholesterol 138 0 - 200 mg/dL   Triglycerides 188 (H) <150 mg/dL   HDL 42 >40 mg/dL   Total CHOL/HDL Ratio 3.3 RATIO   VLDL 38 0 - 40 mg/dL   LDL Cholesterol 58 0 - 99 mg/dL    Comment:        Total Cholesterol/HDL:CHD Risk Coronary Heart Disease Risk Table                     Men   Women  1/2 Average Risk   3.4   3.3  Average Risk       5.0   4.4  2 X Average Risk   9.6   7.1  3 X Average Risk  23.4   11.0        Use the calculated Patient Ratio above and the CHD Risk Table to determine the patient's CHD Risk.        ATP III CLASSIFICATION (LDL):  <100     mg/dL   Optimal  100-129  mg/dL   Near or Above                    Optimal  130-159  mg/dL   Borderline  160-189  mg/dL   High  >190     mg/dL   Very High   Glucose, capillary     Status: Abnormal   Collection Time: 01/22/16  5:22 AM  Result Value Ref Range   Glucose-Capillary 292 (H) 65 - 99 mg/dL   Comment 1 Capillary Specimen   Magnesium     Status: Abnormal   Collection Time: 01/22/16  7:50 AM  Result Value  Ref Range   Magnesium 1.6 (L) 1.7 - 2.4 mg/dL  Potassium     Status: Abnormal   Collection Time: 01/22/16  7:50 AM  Result Value Ref Range   Potassium 6.1 (HH) 3.5 - 5.1 mmol/L    Comment: NO VISIBLE HEMOLYSIS CRITICAL RESULT CALLED TO, READ BACK BY AND VERIFIED WITH: MILFORDJRN [id number] MCCAU[place]   Glucose, capillary     Status: Abnormal   Collection Time: 01/22/16  8:41 AM  Result Value Ref Range   Glucose-Capillary 276 (H) 65 - 99 mg/dL  Glucose, capillary     Status: Abnormal   Collection Time: 01/22/16 12:21 PM  Result Value Ref Range   Glucose-Capillary 209 (H) 65 - 99 mg/dL    Glucose, capillary     Status: None   Collection Time: 01/22/16  3:14 PM  Result Value Ref Range   Glucose-Capillary 88 65 - 99 mg/dL   Comment 1 Capillary Specimen   Glucose, capillary     Status: None   Collection Time: 01/22/16  5:17 PM  Result Value Ref Range   Glucose-Capillary 84 65 - 99 mg/dL  Potassium     Status: Abnormal   Collection Time: 01/22/16  6:02 PM  Result Value Ref Range   Potassium 5.9 (H) 3.5 - 5.1 mmol/L  Magnesium     Status: None   Collection Time: 01/22/16  6:02 PM  Result Value Ref Range   Magnesium 2.3 1.7 - 2.4 mg/dL  Glucose, capillary     Status: Abnormal   Collection Time: 01/22/16  9:00 PM  Result Value Ref Range   Glucose-Capillary 229 (H) 65 - 99 mg/dL   Comment 1 Capillary Specimen   Glucose, capillary     Status: Abnormal   Collection Time: 01/22/16 10:54 PM  Result Value Ref Range   Glucose-Capillary 354 (H) 65 - 99 mg/dL   Comment 1 Venous Specimen   Glucose, capillary     Status: Abnormal   Collection Time: 01/23/16  3:20 AM  Result Value Ref Range   Glucose-Capillary 256 (H) 65 - 99 mg/dL  CBC with Differential/Platelet     Status: Abnormal   Collection Time: 01/23/16  3:32 AM  Result Value Ref Range   WBC 24.6 (H) 4.0 - 10.5 K/uL   RBC 3.46 (L) 4.22 - 5.81 MIL/uL   Hemoglobin 9.2 (L) 13.0 - 17.0 g/dL   HCT 30.2 (L) 39.0 - 52.0 %   MCV 87.3 78.0 - 100.0 fL   MCH 26.6 26.0 - 34.0 pg   MCHC 30.5 30.0 - 36.0 g/dL   RDW 19.6 (H) 11.5 - 15.5 %   Platelets 501 (H) 150 - 400 K/uL   Neutrophils Relative % 80 %   Lymphocytes Relative 8 %   Monocytes Relative 12 %   Eosinophils Relative 0 %   Basophils Relative 0 %   Neutro Abs 19.6 (H) 1.7 - 7.7 K/uL   Lymphs Abs 2.0 0.7 - 4.0 K/uL   Monocytes Absolute 3.0 (H) 0.1 - 1.0 K/uL   Eosinophils Absolute 0.0 0.0 - 0.7 K/uL   Basophils Absolute 0.0 0.0 - 0.1 K/uL   RBC Morphology POLYCHROMASIA PRESENT    WBC Morphology MILD LEFT SHIFT (1-5% METAS, OCC MYELO, OCC BANDS)     Comment:  ATYPICAL LYMPHOCYTES TOXIC GRANULATION   Comprehensive metabolic panel     Status: Abnormal   Collection Time: 01/23/16  3:32 AM  Result Value Ref Range   Sodium 134 (L) 135 - 145 mmol/L  Potassium 7.2 (HH) 3.5 - 5.1 mmol/L    Comment: DELTA CHECK NOTED NO VISIBLE HEMOLYSIS CRITICAL RESULT CALLED TO, READ BACK BY AND VERIFIED WITH: Cash [name] 01/23/16 0545 WAYK    Chloride 100 (L) 101 - 111 mmol/L   CO2 24 22 - 32 mmol/L   Glucose, Bld 273 (H) 65 - 99 mg/dL   BUN 41 (H) 6 - 20 mg/dL   Creatinine, Ser 0.68 0.61 - 1.24 mg/dL   Calcium 9.6 8.9 - 10.3 mg/dL   Total Protein 7.3 6.5 - 8.1 g/dL   Albumin 2.5 (L) 3.5 - 5.0 g/dL   AST 17 15 - 41 U/L   ALT 18 17 - 63 U/L   Alkaline Phosphatase 114 38 - 126 U/L   Total Bilirubin 0.4 0.3 - 1.2 mg/dL   GFR calc non Af Amer >60 >60 mL/min   GFR calc Af Amer >60 >60 mL/min    Comment: (NOTE) The eGFR has been calculated using the CKD EPI equation. This calculation has not been validated in all clinical situations. eGFR's persistently <60 mL/min signify possible Chronic Kidney Disease.    Anion gap 10 5 - 15  Magnesium     Status: None   Collection Time: 01/23/16  3:32 AM  Result Value Ref Range   Magnesium 2.0 1.7 - 2.4 mg/dL  Glucose, capillary     Status: Abnormal   Collection Time: 01/23/16  8:36 AM  Result Value Ref Range   Glucose-Capillary 395 (H) 65 - 99 mg/dL   Comment 1 Capillary Specimen   Basic metabolic panel     Status: Abnormal   Collection Time: 01/23/16 10:51 AM  Result Value Ref Range   Sodium 134 (L) 135 - 145 mmol/L   Potassium 5.8 (H) 3.5 - 5.1 mmol/L   Chloride 102 101 - 111 mmol/L   CO2 23 22 - 32 mmol/L   Glucose, Bld 404 (H) 65 - 99 mg/dL   BUN 43 (H) 6 - 20 mg/dL   Creatinine, Ser 0.77 0.61 - 1.24 mg/dL   Calcium 9.9 8.9 - 10.3 mg/dL   GFR calc non Af Amer >60 >60 mL/min   GFR calc Af Amer >60 >60 mL/min    Comment: (NOTE) The eGFR has been calculated using the CKD EPI equation. This calculation  has not been validated in all clinical situations. eGFR's persistently <60 mL/min signify possible Chronic Kidney Disease.    Anion gap 9 5 - 15  Glucose, capillary     Status: Abnormal   Collection Time: 01/23/16 11:20 AM  Result Value Ref Range   Glucose-Capillary 325 (H) 65 - 99 mg/dL  Glucose, capillary     Status: None   Collection Time: 01/23/16  4:01 PM  Result Value Ref Range   Glucose-Capillary 94 65 - 99 mg/dL   Comment 1 Capillary Specimen       Component Value Date/Time   SDES URINE, CATHETERIZED 01/19/2016 1841   SPECREQUEST NONE 01/19/2016 1841   CULT >=100,000 COLONIES/mL YEAST* 01/19/2016 1841   REPTSTATUS 01/21/2016 FINAL 01/19/2016 1841   No results found. Recent Results (from the past 240 hour(s))  Blood culture (routine x 2)     Status: None   Collection Time: 01/17/16  5:00 PM  Result Value Ref Range Status   Specimen Description BLOOD RIGHT FOREARM  Final   Special Requests BOTTLES DRAWN AEROBIC AND ANAEROBIC 5CC  Final   Culture NO GROWTH 5 DAYS  Final   Report Status 01/22/2016 FINAL  Final  Blood culture (routine x 2)     Status: None   Collection Time: 01/17/16  5:15 PM  Result Value Ref Range Status   Specimen Description BLOOD LEFT ANTECUBITAL  Final   Special Requests IN PEDIATRIC BOTTLE 1CC  Final   Culture NO GROWTH 5 DAYS  Final   Report Status 01/22/2016 FINAL  Final  Urine culture     Status: None   Collection Time: 01/17/16  7:32 PM  Result Value Ref Range Status   Specimen Description URINE, CATHETERIZED  Final   Special Requests Normal  Final   Culture MULTIPLE SPECIES PRESENT, SUGGEST RECOLLECTION  Final   Report Status 01/19/2016 FINAL  Final  MRSA PCR Screening     Status: Abnormal   Collection Time: 01/17/16 11:50 PM  Result Value Ref Range Status   MRSA by PCR POSITIVE (A) NEGATIVE Final    Comment:        The GeneXpert MRSA Assay (FDA approved for NASAL specimens only), is one component of a comprehensive MRSA  colonization surveillance program. It is not intended to diagnose MRSA infection nor to guide or monitor treatment for MRSA infections. RESULT CALLED TO, READ BACK BY AND VERIFIED WITH: MELVIN,RN ON 2H @0311  01/18/16 MKELLYM   Culture, respiratory (NON-Expectorated)     Status: None   Collection Time: 01/19/16  3:23 PM  Result Value Ref Range Status   Specimen Description TRACHEAL ASPIRATE  Final   Special Requests NONE  Final   Gram Stain   Final    ABUNDANT WBC PRESENT, PREDOMINANTLY PMN FEW SQUAMOUS EPITHELIAL CELLS PRESENT FEW GRAM POSITIVE COCCI IN PAIRS Performed at [facility]    Culture   Final    FEW PSEUDOMONAS AERUGINOSA Note: MULTI DRUG RESISTANT ORGANISM COLISTIN 1.5ug/ML SENSITIVE ETEST results for this drug are "FOR INVESTIGATIONAL USE ONLY" and should NOT be used for clinical purposes. CRITICAL RESULT CALLED TO, READ BACK BY AND VERIFIED WITH: [place] RN 10AM  01/23/16 GUSTK Performed at [facility]    Report Status 01/23/2016 FINAL  Final   Organism ID, Bacteria PSEUDOMONAS AERUGINOSA  Final      Susceptibility   Pseudomonas aeruginosa - MIC*    CEFEPIME RESISTANT      CEFTAZIDIME RESISTANT      CIPROFLOXACIN RESISTANT      GENTAMICIN RESISTANT      IMIPENEM RESISTANT      PIP/TAZO RESISTANT      TICAR/CLAVULANIC RESISTANT      TOBRAMYCIN RESISTANT      AMIKACIN 16 SENSITIVE Sensitive     * FEW PSEUDOMONAS AERUGINOSA  Culture, Urine     Status: Abnormal   Collection Time: 01/19/16  6:41 PM  Result Value Ref Range Status   Specimen Description URINE, CATHETERIZED  Final   Special Requests NONE  Final   Culture >=100,000 COLONIES/mL YEAST (A)  Final   Report Status 01/21/2016 FINAL  Final      01/23/2016, 5:37 PM     LOS: 6 days    Records and images were personally reviewed where available.

## 2016-01-24 ENCOUNTER — Ambulatory Visit: Payer: Medicaid Other | Admitting: Family Medicine

## 2016-01-24 LAB — COMPREHENSIVE METABOLIC PANEL
ALT: 21 U/L (ref 17–63)
AST: 18 U/L (ref 15–41)
Albumin: 2.4 g/dL — ABNORMAL LOW (ref 3.5–5.0)
Alkaline Phosphatase: 111 U/L (ref 38–126)
Anion gap: 10 (ref 5–15)
BUN: 43 mg/dL — AB (ref 6–20)
CHLORIDE: 101 mmol/L (ref 101–111)
CO2: 26 mmol/L (ref 22–32)
CREATININE: 0.71 mg/dL (ref 0.61–1.24)
Calcium: 9.6 mg/dL (ref 8.9–10.3)
GFR calc non Af Amer: >60 mL/min (ref >60)
Glucose, Bld: 235 mg/dL — ABNORMAL HIGH (ref 65–99)
POTASSIUM: 6.6 mmol/L — AB (ref 3.5–5.1)
SODIUM: 137 mmol/L (ref 135–145)
Total Bilirubin: 0.4 mg/dL (ref 0.3–1.2)
Total Protein: 7 g/dL (ref 6.5–8.1)

## 2016-01-24 LAB — GLUCOSE, CAPILLARY
GLUCOSE-CAPILLARY: 200 mg/dL — AB (ref 65–99)
GLUCOSE-CAPILLARY: 180 mg/dL — AB (ref 65–99)
GLUCOSE-CAPILLARY: 96 mg/dL (ref 65–99)
Glucose-Capillary: 59 mg/dL — ABNORMAL LOW (ref 65–99)
Glucose-Capillary: 118 mg/dL — ABNORMAL HIGH (ref 65–99)

## 2016-01-24 LAB — CBC WITH DIFFERENTIAL/PLATELET
BASOS ABS: 0 10*3/uL (ref 0.0–0.1)
Basophils Relative: 0 %
EOS ABS: 0 10*3/uL (ref 0.0–0.7)
Eosinophils Relative: 0 %
HCT: 29.5 % — ABNORMAL LOW (ref 39.0–52.0)
Hemoglobin: 9 g/dL — ABNORMAL LOW (ref 13.0–17.0)
LYMPHS PCT: 7 %
Lymphs Abs: 1.8 10*3/uL (ref 0.7–4.0)
MCH: 26.6 pg (ref 26.0–34.0)
MCHC: 30.5 g/dL (ref 30.0–36.0)
MCV: 87.3 fL (ref 78.0–100.0)
MONO ABS: 2 10*3/uL — AB (ref 0.1–1.0)
Monocytes Relative: 8 %
NEUTROS PCT: 85 %
Neutro Abs: 21.7 10*3/uL — ABNORMAL HIGH (ref 1.7–7.7)
PLATELETS: 563 10*3/uL — AB (ref 150–400)
RBC: 3.38 MIL/uL — ABNORMAL LOW (ref 4.22–5.81)
RDW: 19.6 % — AB (ref 11.5–15.5)
WBC: 25.5 10*3/uL — AB (ref 4.0–10.5)

## 2016-01-24 LAB — MAGNESIUM: MAGNESIUM: 1.8 mg/dL (ref 1.7–2.4)

## 2016-01-24 MED ORDER — SODIUM POLYSTYRENE SULFONATE 15 GM/60ML PO SUSP
60.0000 g | Freq: Once | ORAL | Status: AC
  Administered 2016-01-24: 60 g
  Filled 2016-01-24: qty 240

## 2016-01-24 MED ORDER — SODIUM POLYSTYRENE SULFONATE 15 GM/60ML PO SUSP
30.0000 g | Freq: Once | ORAL | Status: AC
  Administered 2016-01-24: 30 g via ORAL
  Filled 2016-01-24: qty 120

## 2016-01-24 NOTE — Progress Notes (Signed)
Pt refuses CPT at this time. 

## 2016-01-24 NOTE — Progress Notes (Signed)
This nurse received critical value from lab at 0420 regarding pt's potassium 6.6. Paged the K. Schorr at [phone] and then again at 0600. No response received at present time.

## 2016-01-24 NOTE — Progress Notes (Signed)
Weekend case manager had contacted [facility] about pulm vest. [facility] had started process but needed form signed by md and note explaining he had failed other pulm toilet measures. Have spoken w dr Sherral Hammers. Faxed note from inf dis.-signed form by dr Sherral Hammers to [facility]. They have done measurements. Await approval from medicaid then vest delivery. [facility] could not resume home nsg til 4-19. Await del of pul vest then coord dc home. Alerted mom that pt would not get to be dc today but hope for 4-19 but this depends on vest.

## 2016-01-24 NOTE — Plan of Care (Signed)
4/17 Consulted infectious disease patient colonized with MDR Pseudomonas.only way of decreasing multiple hospitalizations will be use of physiotherapy vest BID, frequent deep suctioning through trach, spirometry use. All other treatment modalities have failed to include repositioning, chest PT  -failure to provide home physiotherapy vest will result in patient returning to hospital minimal every other month for acute infection/sepsis. Will increase patient's morbidity and mortality.

## 2016-01-24 NOTE — Progress Notes (Signed)
[place] TEAM 1 - Stepdown/ICU TEAM Progress Note  Jason [name] [id number] DOB: 1985-10-12 DOA: 01/17/2016 PCP: [name], MD  Admit HPI / Brief Narrative: Jason [name] is a 30 y.o. 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 He is well known to our service from multiple prior admissions.Hx  MDR Pseudomonas and MRSA.Gastroparesis;Type I diabetes mellitus ;Diabetic neuropathy ; Dysrhythmia; Arthritis; Multiple allergies (11/02/2015); and Recurrent UTI (11/02/2015).   He had recent prolonged admission 12/06/15 through 01/05/16 for worsening hypoxic respiratory failure due to HCAP. He was discharged home and reports that he had done well since discharge. On 01/16/16, he spiked fever to 102 and mother reports that she suctioned thick yellow sputum from pt's trach. On 01/17/16, temp dropped to 101F but pt still had thick sputum along with mild SOB. Due to concern for developing PNA, his mother thought it would be best to bring him to ED for further evaluation.  In ED, CXR was concerning for multifocal PNA. In addition, he was found to have UTI and anemia with Hgb 6.5. Mother reports that he has had a little blood from his sacral wounds and had a feeling that his Hgb would be on the low side.  He was initially placed on full vent support in ED, but shortly after asked to come off so that he could eat. During my exam, he is resting comfortably, has PMV in and is eating dinner without problems. He denies any chest pain, SOB, N/V/D, abd pain, myalgias. He does have persistent back pain, unchanged from baseline. He is requesting to go home.  HPI/Subjective: 4/18 afebrile overnight, A/O 4, NAD. Patient's CBGs elevated secondary to father preventing patient from receiving scheduled insulin.   Assessment/Plan: Acute on Chronic respiratory failure  with hypoxia  - s/p tracheostomy.  -Respiratory status significantly improved, still obtaining minimal purulent yellow/green sputum from trach with bronchodilator and physiotherapy vest..  Sepsis unspecified organism/HCAP w/ Probable Tracheobronchitis:positive MDR Pseudomonas  -Tracheal aspirate thick purulent greenish yellow color c/w Pseudomonas. Culture positive MDR Pseudomonas -Completed 7 day treatment of Vancomycin, Cefepime on 4/17 for HCAP -Continue PMV as tolerated. -Full vent support as needed only. -Albuterol. QID -Physiotherapy vest BID -NTS  BID. -NCM Mariane Masters looking into obtaining physiotherapy vest for home -4/17 Consulted infectious disease patient colonized with MDR Pseudomonas.only way of decreasing multiple hospitalizations will be use of physiotherapy vest BID, frequent deep suctioning through trach, spirometry use. All other treatment modalities have failed to include repositioning, chest PT . Funguria/ positive Yeast: -Though probably colonization, strongly positive and increasing leukocytosis in a very frail patient. Continue Diflucan 200 mg daily for 14 days of treatment.   Multiple decubitus ulcers -Spoke with PT do not believe hydrotherapy would be of benefit.  -Reminded patient to allow staff to frequently return him to ensure minimal pressure on ulcers. -WOC recommending continue Aquacel for the present. Resume wound VAC after patient stable and discharged home.  Chronic Hypotension  - Midodrine 20 mg TID -4/17 D/C Solucortef    Hyponatremia. -Resolved   Acute blood loss anemia? vs Anemia Chronic disease  - 4/12 Transfuse 1u PRBC.  Gastroparesis.  Nutrition. -Glucerna 1.2 @ 156ml/hrwas resulting in hyperkalemia. Change to Nephro Carb Steady at 41ml/hr. This feeding also has high potassium. Will most likely have to decrease feeding rate in order to prevent hyperkalemia -Pre-albumin LOW 10.3 -Patient heart healthy/carb modified diet  DM. Type  I uncontrolled with complication -Increase Lantus 28 units. Patient now consuming PO  -Resistant SSI. -NovoLog 6 units q 6hrs to cover tube feedings -4/16 Hemoglobin A1c= 7.4 -Lipid panel; within ADA guidelines  Hyperkalemia -Kayexalate 60 gm  Hypomagnesemia        Code Status: FULL Family Communication: None present at time of exam Disposition Plan: discharge on 4/19.    Consultants: PCCM  Procedure/Significant Events: NA   Culture 4/11 blood right forearm/left AC NGTD 4/11 urine positive multiple species 4/11 MRSA by PCR positive 4/13 urine positive yeast 4/13 Tracheal Aspirate positive MDR Pseudomonas   Antibiotics: Cefepime 4/11>> 4/17 Vancomycin4/11>> 4/17  Diflucan 4/16>>  DVT prophylaxis: Subcutaneous heparin   Devices Tracheostomy in place   LINES / TUBES:  NA    Continuous Infusions:    Objective: VITAL SIGNS: Temp: 98.2 F (36.8 C) (04/18 1600) Temp Source: Oral (04/18 1600) BP: 158/90 mmHg (04/18 1800) Pulse Rate: 97 (04/18 1800) SPO2; FIO2:   Intake/Output Summary (Last 24 hours) at 01/24/16 1853 Last data filed at 01/24/16 1800  Gross per 24 hour  Intake   2830 ml  Output   2100 ml  Net    730 ml     Exam: General: A/O 4, sitting up in bed Negative acute respiratory distress (requires Occasional deep suctioning) Eyes: negative scleral hemorrhage ENT: Negative Runny nose, negative gingival bleeding, Neck:  Negative scars, masses, torticollis, lymphadenopathy, JVD Lungs: clear to auscultation bilateral  Cardiovascular: Regular rhythm and rate without murmur gallop or rub normal S1 and S2 Abdomen:negative abdominal pain, nondistended, positive soft, bowel sounds, no rebound, no ascites, no appreciable mass, ostomy bag in place LUQ with stool Skin; 3 large decubitus ulcers bilateral hip and sacrum to the bone, with some dead tissue visible. Extremities: No significant cyanosis, clubbing, or edema bilateral lower  extremities, bilateral foot lacerations in various states of healing Psychiatric:  Negative depression, negative anxiety, negative fatigue, negative mania Neurologic:  Cranial nerves II through XII intact, tongue/uvula midline, quadriplegic    Data Reviewed: Basic Metabolic Panel:  Recent Labs Lab 01/18/16 0519  01/20/16 0318 01/21/16 0331 01/22/16 0339 01/22/16 0750 01/22/16 1802 01/23/16 0332 01/23/16 1051 01/24/16 0327  NA 128*  < > 137 138 134*  --   --  134* 134* 137  K 5.1  < > 4.9 5.5* 7.1* 6.1* 5.9* 7.2* 5.8* 6.6*  CL 98*  < > 106 106 102  --   --  100* 102 101  CO2 11*  < > 20* 22 23  --   --  24 23 26   GLUCOSE 539*  < > 318* 259* 235*  --   --  273* 404* 235*  BUN 48*  < > 42* 42* 43*  --   --  41* 43* 43*  CREATININE 1.44*  < > 0.82 0.62 0.61  --   --  0.68 0.77 0.71  CALCIUM 8.9  < > 9.3 9.3 9.4  --   --  9.6 9.9 9.6  MG 1.7  --  1.8 1.6* 1.6* 1.6* 2.3 2.0  --  1.8  PHOS 6.3*  --  2.7  --   --   --   --   --   --   --   < > = values in this interval not displayed. Liver Function Tests:  Recent Labs Lab 01/20/16 0318 01/21/16 0331 01/22/16 0339 01/23/16 0332 01/24/16 0327  AST 14* 16 22 17 18   ALT 14* 13* 17 18 21  ALKPHOS 119 104 106 114 111  BILITOT 0.4 0.4 0.4 0.4 0.4  PROT 7.1 6.9 6.9 7.3 7.0  ALBUMIN 2.2* 2.1* 2.3* 2.5* 2.4*   No results for input(s): LIPASE, AMYLASE in the last 168 hours.  Recent Labs Lab 01/19/16 0246  AMMONIA 49*   CBC:  Recent Labs Lab 01/20/16 0318 01/21/16 0331 01/22/16 0339 01/23/16 0332 01/24/16 0327  WBC 23.7* 20.3* 25.2* 24.6* 25.5*  NEUTROABS 21.6* 15.3* 20.4* 19.6* 21.7*  HGB 8.5* 8.3* 9.2* 9.2* 9.0*  HCT 26.7* 26.2* 29.9* 30.2* 29.5*  MCV 82.4 84.8 86.2 87.3 87.3  PLT 406* 393 456* 501* 563*   Cardiac Enzymes: No results for input(s): CKTOTAL, CKMB, CKMBINDEX, TROPONINI in the last 168 hours. BNP (last 3 results) No results for input(s): BNP in the last 8760 hours.  ProBNP (last 3 results) No  results for input(s): PROBNP in the last 8760 hours.  CBG:  Recent Labs Lab 01/23/16 1601 01/23/16 1740 01/24/16 0812 01/24/16 1150 01/24/16 1559  GLUCAP 94 148* 200* 180* 96    Recent Results (from the past 240 hour(s))  Blood culture (routine x 2)     Status: None   Collection Time: 01/17/16  5:00 PM  Result Value Ref Range Status   Specimen Description BLOOD RIGHT FOREARM  Final   Special Requests BOTTLES DRAWN AEROBIC AND ANAEROBIC 5CC  Final   Culture NO GROWTH 5 DAYS  Final   Report Status 01/22/2016 FINAL  Final  Blood culture (routine x 2)     Status: None   Collection Time: 01/17/16  5:15 PM  Result Value Ref Range Status   Specimen Description BLOOD LEFT ANTECUBITAL  Final   Special Requests IN PEDIATRIC BOTTLE 1CC  Final   Culture NO GROWTH 5 DAYS  Final   Report Status 01/22/2016 FINAL  Final  Urine culture     Status: None   Collection Time: 01/17/16  7:32 PM  Result Value Ref Range Status   Specimen Description URINE, CATHETERIZED  Final   Special Requests Normal  Final   Culture MULTIPLE SPECIES PRESENT, SUGGEST RECOLLECTION  Final   Report Status 01/19/2016 FINAL  Final  MRSA PCR Screening     Status: Abnormal   Collection Time: 01/17/16 11:50 PM  Result Value Ref Range Status   MRSA by PCR POSITIVE (A) NEGATIVE Final    Comment:        The GeneXpert MRSA Assay (FDA approved for NASAL specimens only), is one component of a comprehensive MRSA colonization surveillance program. It is not intended to diagnose MRSA infection nor to guide or monitor treatment for MRSA infections. RESULT CALLED TO, READ BACK BY AND VERIFIED WITH: MELVIN,RN ON 2H @0311  01/18/16 MKELLYM   Culture, respiratory (NON-Expectorated)     Status: None   Collection Time: 01/19/16  3:23 PM  Result Value Ref Range Status   Specimen Description TRACHEAL ASPIRATE  Final   Special Requests NONE  Final   Gram Stain   Final    ABUNDANT WBC PRESENT, PREDOMINANTLY PMN FEW SQUAMOUS  EPITHELIAL CELLS PRESENT FEW GRAM POSITIVE COCCI IN PAIRS Performed at [facility]    Culture   Final    FEW PSEUDOMONAS AERUGINOSA Note: MULTI DRUG RESISTANT ORGANISM COLISTIN 1.5ug/ML SENSITIVE ETEST results for this drug are "FOR INVESTIGATIONAL USE ONLY" and should NOT be used for clinical purposes. CRITICAL RESULT CALLED TO, READ BACK BY AND VERIFIED WITH: STRAMOSKI RN 10AM  01/23/16 GUSTK Performed at [facility]  Report Status 01/23/2016 FINAL  Final   Organism ID, Bacteria PSEUDOMONAS AERUGINOSA  Final      Susceptibility   Pseudomonas aeruginosa - MIC*    CEFEPIME RESISTANT      CEFTAZIDIME RESISTANT      CIPROFLOXACIN RESISTANT      GENTAMICIN RESISTANT      IMIPENEM RESISTANT      PIP/TAZO RESISTANT      TICAR/CLAVULANIC RESISTANT      TOBRAMYCIN RESISTANT      AMIKACIN 16 SENSITIVE Sensitive     * FEW PSEUDOMONAS AERUGINOSA  Culture, Urine     Status: Abnormal   Collection Time: 01/19/16  6:41 PM  Result Value Ref Range Status   Specimen Description URINE, CATHETERIZED  Final   Special Requests NONE  Final   Culture >=100,000 COLONIES/mL YEAST (A)  Final   Report Status 01/21/2016 FINAL  Final     Studies:  Recent x-ray studies have been reviewed in detail by the Attending Physician  Scheduled Meds:  Scheduled Meds: . antiseptic oral rinse  7 mL Mouth Rinse q12n4p  . baclofen  20 mg Oral QID  . buPROPion  150 mg Oral Daily  . chlorhexidine  15 mL Mouth Rinse BID  . docusate  100 mg Oral BID  . feeding supplement (NEPRO CARB STEADY)  1,000 mL Per Tube Q12H  . fentaNYL  50 mcg Transdermal Q72H  . ferrous sulfate  325 mg Oral Q breakfast  . fluconazole  200 mg Oral Daily  . heparin  5,000 Units Subcutaneous 3 times per day  . insulin aspart  0-20 Units Subcutaneous 6 times per day  . insulin aspart  6 Units Subcutaneous Q4H  . insulin glargine  28 Units Subcutaneous Daily  . loratadine  10 mg Oral Daily  . magnesium oxide  400 mg Oral  BID  . methylPREDNISolone (SOLU-MEDROL) injection  20 mg Intravenous Q12H  . midodrine  20 mg Oral TID WC  . pregabalin  100 mg Per Tube TID    Time spent on care of this patient: 40 mins   Wynter Grave, Geraldo Docker , MD  [facility] Office  [phone] Pager - [phone]  On-Call/Text Page:      [email or web address]      password [email or web address]  If 7PM-7AM, please contact night-coverage [email or web address] Password [facility] 01/24/2016, 6:53 PM   LOS: 7 days   Care during the described time interval was provided by me .  I have reviewed this patient's available data, including medical history, events of note, physical examination, and all test results as part of my evaluation. I have personally reviewed and interpreted all radiology studies.   Dia Crawford, MD [phone] Pager

## 2016-01-24 NOTE — Progress Notes (Signed)
Patient refused chest PT at this time.

## 2016-01-25 DIAGNOSIS — E1065 Type 1 diabetes mellitus with hyperglycemia: Secondary | ICD-10-CM

## 2016-01-25 DIAGNOSIS — E108 Type 1 diabetes mellitus with unspecified complications: Secondary | ICD-10-CM

## 2016-01-25 DIAGNOSIS — L8990 Pressure ulcer of unspecified site, unspecified stage: Secondary | ICD-10-CM

## 2016-01-25 DIAGNOSIS — E875 Hyperkalemia: Secondary | ICD-10-CM

## 2016-01-25 LAB — CBC WITH DIFFERENTIAL/PLATELET
BASOS ABS: 0 10*3/uL (ref 0.0–0.1)
Basophils Relative: 0 %
Eosinophils Absolute: 0 10*3/uL (ref 0.0–0.7)
Eosinophils Relative: 0 %
HCT: 30.6 % — ABNORMAL LOW (ref 39.0–52.0)
Hemoglobin: 9 g/dL — ABNORMAL LOW (ref 13.0–17.0)
LYMPHS ABS: 1.6 10*3/uL (ref 0.7–4.0)
Lymphocytes Relative: 5 %
MCH: 26 pg (ref 26.0–34.0)
MCHC: 29.4 g/dL — ABNORMAL LOW (ref 30.0–36.0)
MCV: 88.4 fL (ref 78.0–100.0)
MONO ABS: 3 10*3/uL — AB (ref 0.1–1.0)
Monocytes Relative: 9 %
Neutro Abs: 28.3 10*3/uL — ABNORMAL HIGH (ref 1.7–7.7)
Neutrophils Relative %: 86 %
PLATELETS: 615 10*3/uL — AB (ref 150–400)
RBC: 3.46 MIL/uL — AB (ref 4.22–5.81)
RDW: 19.8 % — AB (ref 11.5–15.5)
WBC: 32.9 10*3/uL — AB (ref 4.0–10.5)

## 2016-01-25 LAB — GLUCOSE, CAPILLARY
GLUCOSE-CAPILLARY: 186 mg/dL — AB (ref 65–99)
GLUCOSE-CAPILLARY: 199 mg/dL — AB (ref 65–99)
GLUCOSE-CAPILLARY: 233 mg/dL — AB (ref 65–99)
Glucose-Capillary: 186 mg/dL — ABNORMAL HIGH (ref 65–99)
Glucose-Capillary: 72 mg/dL (ref 65–99)

## 2016-01-25 LAB — BASIC METABOLIC PANEL
ANION GAP: 11 (ref 5–15)
BUN: 32 mg/dL — ABNORMAL HIGH (ref 6–20)
CHLORIDE: 97 mmol/L — AB (ref 101–111)
CO2: 27 mmol/L (ref 22–32)
CREATININE: 0.52 mg/dL — AB (ref 0.61–1.24)
Calcium: 9.5 mg/dL (ref 8.9–10.3)
GFR calc non Af Amer: >60 mL/min (ref >60)
Glucose, Bld: 218 mg/dL — ABNORMAL HIGH (ref 65–99)
POTASSIUM: 4.9 mmol/L (ref 3.5–5.1)
SODIUM: 135 mmol/L (ref 135–145)

## 2016-01-25 LAB — MAGNESIUM: Magnesium: 1.7 mg/dL (ref 1.7–2.4)

## 2016-01-25 MED ORDER — INSULIN ASPART 100 UNIT/ML ~~LOC~~ SOLN: 0.0000 [IU] | Freq: Three times a day (TID) | SUBCUTANEOUS | Status: DC

## 2016-01-25 MED ORDER — INSULIN GLARGINE 100 UNIT/ML ~~LOC~~ SOLN
22.0000 [IU] | Freq: Every day | SUBCUTANEOUS | Status: DC
  Administered 2016-01-26: 22 [IU] via SUBCUTANEOUS
  Filled 2016-01-25: qty 0.22

## 2016-01-25 MED ORDER — INSULIN ASPART 100 UNIT/ML ~~LOC~~ SOLN: 0.0000 [IU] | Freq: Every day | SUBCUTANEOUS | Status: DC

## 2016-01-25 MED ORDER — INSULIN ASPART 100 UNIT/ML ~~LOC~~ SOLN: 6.0000 [IU] | Freq: Three times a day (TID) | SUBCUTANEOUS | Status: DC

## 2016-01-25 MED ORDER — ALBUTEROL SULFATE (2.5 MG/3ML) 0.083% IN NEBU: 2.5000 mg | INHALATION_SOLUTION | RESPIRATORY_TRACT | Status: DC | PRN

## 2016-01-25 MED ORDER — INSULIN ASPART 100 UNIT/ML ~~LOC~~ SOLN
6.0000 [IU] | Freq: Three times a day (TID) | SUBCUTANEOUS | Status: DC
  Administered 2016-01-25 – 2016-01-26 (×5): 6 [IU] via SUBCUTANEOUS

## 2016-01-25 MED ORDER — INSULIN ASPART 100 UNIT/ML ~~LOC~~ SOLN
0.0000 [IU] | Freq: Three times a day (TID) | SUBCUTANEOUS | Status: DC
  Administered 2016-01-25 (×2): 2 [IU] via SUBCUTANEOUS
  Administered 2016-01-26 (×2): 9 [IU] via SUBCUTANEOUS

## 2016-01-25 MED ORDER — HYDROCORTISONE 10 MG PO TABS
10.0000 mg | ORAL_TABLET | Freq: Two times a day (BID) | ORAL | Status: DC
  Administered 2016-01-25 – 2016-01-26 (×3): 10 mg via ORAL
  Filled 2016-01-25 (×4): qty 1

## 2016-01-25 NOTE — Progress Notes (Signed)
[place] TEAM 1 - Stepdown/ICU TEAM Progress Note  Jason [name] [id number] DOB: 1986/05/04 DOA: 01/17/2016 PCP: [name], MD  Admit HPI / Brief Narrative: 30 y.o. 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  On 01/16/16, he spiked fever to 102 and mother reported that she suctioned thick yellow sputum from his trach. On 01/17/16, temp dropped to 101F but pt still had thick sputum along with mild SOB. Due to concern for developing PNA, his mother brought him to ED for further evaluation.  In ED, CXR was concerning for multifocal PNA. In addition, he was found to have a UTI and anemia with Hgb 6.5. Mother reported that he had a little ongoing blood loss from his sacral wounds.  HPI/Subjective: Resting comfortably in bed.  Anxious to d/c home.  No new complaints.  Alert and oriented and comfortable on trach collar only.    Assessment/Plan:  Acute on Chronic respiratory failure with hypoxia -Respiratory status significantly improved  Sepsis due to MDR Pseudomonas HCAP -Tracheal aspirate thick purulent greenish yellow color c/w Pseudomonas. Culture positive MDR Pseudomonas -Completed 7 day treatment of Vancomycin, Cefepime on 4/17 for HCAP -Physiotherapy vest BID -NTS  BID -4/17 Consulted infectious disease patient colonized with MDR Pseudomonas.only way of decreasing multiple hospitalizations will be use of physiotherapy vest BID, frequent deep suctioning through trach, spirometry use. All other treatment modalities have failed to include repositioning, chest PT  Funguria -Diflucan 200 mg daily for 14 days of treatment  Multiple decubitus  ulcers -WOC recommending continue Aquacel for the present. Resume wound VAC after patient stable and discharged home.  Chronic Hypotension  -Midodrine TID  Hyponatremia -Resolved   Acute blood loss anemia? vs Anemia Chronic disease  -transfuse 1u PRBC - Hgb stable   Gastroparesis  Nutrition. Glucerna 1.2 @ 143ml/hrwas resulting in hyperkalemia - changed to Nepro Carb Steady at 85ml/hr - recheck K+  DM I uncontrolled with complications [id number] 7.4  Hyperkalemia -feeding formula has been changed - recheck K+ today and in AM  Hypomagnesemia -resolved   Code Status: FULL Family Communication: None present at time of exam Disposition Plan: f/u K+ - awaiting home percussion vest   Consultants: PCCM  Procedure/Significant Events: NA  Antibiotics: Cefepime 4/11 > 4/17 Vancomycin 4/11 > 4/16 Diflucan 4/16 >  DVT prophylaxis: Subcutaneous heparin  Objective: VITAL SIGNS: Temp: 97.9 F (36.6 C) (04/19 0758) Temp Source: Oral (04/19 0758) BP: 162/97 mmHg (04/19 1100) Pulse Rate: 89 (04/19 1100)  Intake/Output Summary (Last 24 hours) at 01/25/16 1203 Last data filed at 01/25/16 1100  Gross per 24 hour  Intake 6217.33 ml  Output   2850 ml  Net 3367.33 ml    Exam: General: No acute respiratory distress on trach collar w/ PMV in place  Lungs: Clear to auscultation bilaterally without wheezes or crackles Cardiovascular: Regular rate and rhythm without murmur gallop or rub normal S1 and S2 Abdomen: Nontender, nondistended, soft, bowel sounds positive, no rebound, no ascites, no appreciable mass - ostomy and Gtube unremarkable  Extremities: No significant cyanosis, clubbing, or edema bilateral lower extremities  Data Reviewed: Basic Metabolic Panel:  Recent Labs Lab 01/20/16 0318 01/21/16 0331 01/22/16 0339 01/22/16 0750 01/22/16 1802 01/23/16 0332 01/23/16 1051 01/24/16 0327 01/25/16 0222  NA 137  138 134*  --   --  134* 134* 137  --   K 4.9 5.5* 7.1*  6.1* 5.9* 7.2* 5.8* 6.6*  --   CL 106 106 102  --   --  100* 102 101  --   CO2 20* 22 23  --   --  24 23 26   --   GLUCOSE 318* 259* 235*  --   --  273* 404* 235*  --   BUN 42* 42* 43*  --   --  41* 43* 43*  --   CREATININE 0.82 0.62 0.61  --   --  0.68 0.77 0.71  --   CALCIUM 9.3 9.3 9.4  --   --  9.6 9.9 9.6  --   MG 1.8 1.6* 1.6* 1.6* 2.3 2.0  --  1.8 1.7  PHOS 2.7  --   --   --   --   --   --   --   --    Liver Function Tests:  Recent Labs Lab 01/20/16 0318 01/21/16 0331 01/22/16 0339 01/23/16 0332 01/24/16 0327  AST 14* 16 22 17 18   ALT 14* 13* 17 18 21   ALKPHOS 119 104 106 114 111  BILITOT 0.4 0.4 0.4 0.4 0.4  PROT 7.1 6.9 6.9 7.3 7.0  ALBUMIN 2.2* 2.1* 2.3* 2.5* 2.4*    Recent Labs Lab 01/19/16 0246  AMMONIA 49*   CBC:  Recent Labs Lab 01/21/16 0331 01/22/16 0339 01/23/16 0332 01/24/16 0327 01/25/16 0222  WBC 20.3* 25.2* 24.6* 25.5* 32.9*  NEUTROABS 15.3* 20.4* 19.6* 21.7* 28.3*  HGB 8.3* 9.2* 9.2* 9.0* 9.0*  HCT 26.2* 29.9* 30.2* 29.5* 30.6*  MCV 84.8 86.2 87.3 87.3 88.4  PLT 393 456* 501* 563* 615*   CBG:  Recent Labs Lab 01/24/16 1953 01/24/16 2136 01/24/16 2332 01/25/16 0348 01/25/16 0755  GLUCAP 59* 118* 233* 186* 72    Recent Results (from the past 240 hour(s))  Blood culture (routine x 2)     Status: None   Collection Time: 01/17/16  5:00 PM  Result Value Ref Range Status   Specimen Description BLOOD RIGHT FOREARM  Final   Special Requests BOTTLES DRAWN AEROBIC AND ANAEROBIC 5CC  Final   Culture NO GROWTH 5 DAYS  Final   Report Status 01/22/2016 FINAL  Final  Blood culture (routine x 2)     Status: None   Collection Time: 01/17/16  5:15 PM  Result Value Ref Range Status   Specimen Description BLOOD LEFT ANTECUBITAL  Final   Special Requests IN PEDIATRIC BOTTLE 1CC  Final   Culture NO GROWTH 5 DAYS  Final   Report Status 01/22/2016 FINAL  Final  Urine culture     Status: None   Collection Time: 01/17/16  7:32 PM  Result Value  Ref Range Status   Specimen Description URINE, CATHETERIZED  Final   Special Requests Normal  Final   Culture MULTIPLE SPECIES PRESENT, SUGGEST RECOLLECTION  Final   Report Status 01/19/2016 FINAL  Final  MRSA PCR Screening     Status: Abnormal   Collection Time: 01/17/16 11:50 PM  Result Value Ref Range Status   MRSA by PCR POSITIVE (A) NEGATIVE Final    Comment:        The GeneXpert MRSA Assay (FDA approved for NASAL specimens only), is one component of a comprehensive MRSA colonization surveillance program. It is not intended to diagnose MRSA infection nor to guide or monitor treatment for MRSA infections. RESULT CALLED  TO, READ BACK BY AND VERIFIED WITH: MELVIN,RN ON 2H @0311  01/18/16 MKELLYM   Culture, respiratory (NON-Expectorated)     Status: None   Collection Time: 01/19/16  3:23 PM  Result Value Ref Range Status   Specimen Description TRACHEAL ASPIRATE  Final   Special Requests NONE  Final   Gram Stain   Final    ABUNDANT WBC PRESENT, PREDOMINANTLY PMN FEW SQUAMOUS EPITHELIAL CELLS PRESENT FEW GRAM POSITIVE COCCI IN PAIRS Performed at [facility]    Culture   Final    FEW PSEUDOMONAS AERUGINOSA Note: MULTI DRUG RESISTANT ORGANISM COLISTIN 1.5ug/ML SENSITIVE ETEST results for this drug are "FOR INVESTIGATIONAL USE ONLY" and should NOT be used for clinical purposes. CRITICAL RESULT CALLED TO, READ BACK BY AND VERIFIED WITH: Hitchcock RN 10AM  01/23/16 GUSTK Performed at [facility]    Report Status 01/23/2016 FINAL  Final   Organism ID, Bacteria PSEUDOMONAS AERUGINOSA  Final      Susceptibility   Pseudomonas aeruginosa - MIC*    CEFEPIME RESISTANT      CEFTAZIDIME RESISTANT      CIPROFLOXACIN RESISTANT      GENTAMICIN RESISTANT      IMIPENEM RESISTANT      PIP/TAZO RESISTANT      TICAR/CLAVULANIC RESISTANT      TOBRAMYCIN RESISTANT      AMIKACIN 16 SENSITIVE Sensitive     * FEW PSEUDOMONAS AERUGINOSA  Culture, Urine     Status: Abnormal    Collection Time: 01/19/16  6:41 PM  Result Value Ref Range Status   Specimen Description URINE, CATHETERIZED  Final   Special Requests NONE  Final   Culture >=100,000 COLONIES/mL YEAST (A)  Final   Report Status 01/21/2016 FINAL  Final     Studies:  Recent x-ray studies have been reviewed in detail by the Attending Physician  Scheduled Meds:  Scheduled Meds: . antiseptic oral rinse  7 mL Mouth Rinse q12n4p  . baclofen  20 mg Oral QID  . buPROPion  150 mg Oral Daily  . chlorhexidine  15 mL Mouth Rinse BID  . docusate  100 mg Oral BID  . feeding supplement (NEPRO CARB STEADY)  1,000 mL Per Tube Q12H  . fentaNYL  50 mcg Transdermal Q72H  . ferrous sulfate  325 mg Oral Q breakfast  . fluconazole  200 mg Oral Daily  . heparin  5,000 Units Subcutaneous 3 times per day  . insulin aspart  0-20 Units Subcutaneous 6 times per day  . insulin aspart  6 Units Subcutaneous Q4H  . [START ON 01/26/2016] insulin glargine  22 Units Subcutaneous Daily  . loratadine  10 mg Oral Daily  . magnesium oxide  400 mg Oral BID  . methylPREDNISolone (SOLU-MEDROL) injection  20 mg Intravenous Q12H  . midodrine  20 mg Oral TID WC  . pregabalin  100 mg Per Tube TID    Time spent on care of this patient: 25 mins  Cherene Altes, MD [facility] Office  [phone] Pager - Text Page per Shea Evans as per below:  On-Call/Text Page:      [email or web address]      password [email or web address]  If 7PM-7AM, please contact night-coverage [email or web address] Password [email or web address]  01/25/2016, 12:03 PM   LOS: 8 days

## 2016-01-25 NOTE — Progress Notes (Signed)
Inpatient Diabetes Program Recommendations  AACE/ADA: New Consensus Statement on Inpatient Glycemic Control (2015)  Target Ranges:  Prepandial:   less than 140 mg/dL      Peak postprandial:   less than 180 mg/dL (1-2 hours)      Critically ill patients:  140 - 180 mg/dL   Review of Glycemic ControlResults for GRAY, AFFLECK (MRN [id number]) as of 01/25/2016 10:45  Ref. Range 01/23/2016 17:40 01/24/2016 08:12 01/24/2016 11:50 01/24/2016 15:59 01/24/2016 19:53 01/24/2016 21:36 01/24/2016 23:32 01/25/2016 03:48 01/25/2016 07:55  Glucose-Capillary Latest Ref Range: 65-99 mg/dL 148 (H) 200 (H) 180 (H) 96 59 (L) 118 (H) 233 (H) 186 (H) 72    Diabetes history: Type 1 diabetes Outpatient Diabetes medications: Lantus 15 units daily, Novolog 0-10 units four times daily Current orders for Inpatient glycemic control:  Lantus 28 units daily, Novolog 6 units q 4 hours, Novolog resistant q 4 hours  Inpatient Diabetes Program Recommendations:   Note that patient has history of labile blood sugars. Patient had hypoglycemia 01/24/16. May consider reducing Lantus to 22 units daily.  Also please reduce Novolog correction to sensitive q 4 hours.  If tube feeds held for any reason, please consider adding continuous D10 until tube feed restarted in order to prevent hypoglycemia.   Thanks, Adah Perl, RN, BC-ADM Inpatient Diabetes Coordinator Pager [phone] (8a-5p)

## 2016-01-25 NOTE — Progress Notes (Signed)
Faxed addit inform to [facility] this am before 8am. They did not get it and have refaxed. Cont to await approval from medicaid for pulm vest. Updated [facility] thay still do not have vest. Will try again 4-20 for dc if vest approved. Updated pt's mom today about vest .

## 2016-01-26 ENCOUNTER — Telehealth: Payer: Self-pay | Admitting: Family Medicine

## 2016-01-26 DIAGNOSIS — D638 Anemia in other chronic diseases classified elsewhere: Secondary | ICD-10-CM

## 2016-01-26 LAB — CBC
HEMATOCRIT: 30 % — AB (ref 39.0–52.0)
Hemoglobin: 9 g/dL — ABNORMAL LOW (ref 13.0–17.0)
MCH: 26.2 pg (ref 26.0–34.0)
MCHC: 30 g/dL (ref 30.0–36.0)
MCV: 87.5 fL (ref 78.0–100.0)
Platelets: 642 10*3/uL — ABNORMAL HIGH (ref 150–400)
RBC: 3.43 MIL/uL — AB (ref 4.22–5.81)
RDW: 19.5 % — ABNORMAL HIGH (ref 11.5–15.5)
WBC: 31.1 10*3/uL — AB (ref 4.0–10.5)

## 2016-01-26 LAB — BASIC METABOLIC PANEL
Anion gap: 11 (ref 5–15)
Anion gap: 12 (ref 5–15)
BUN: 38 mg/dL — ABNORMAL HIGH (ref 6–20)
BUN: 44 mg/dL — AB (ref 6–20)
CHLORIDE: 92 mmol/L — AB (ref 101–111)
CHLORIDE: 96 mmol/L — AB (ref 101–111)
CO2: 27 mmol/L (ref 22–32)
CO2: 28 mmol/L (ref 22–32)
CREATININE: 0.63 mg/dL (ref 0.61–1.24)
Calcium: 9 mg/dL (ref 8.9–10.3)
Calcium: 9.5 mg/dL (ref 8.9–10.3)
Creatinine, Ser: 0.72 mg/dL (ref 0.61–1.24)
GFR calc Af Amer: >60 mL/min (ref >60)
GFR calc non Af Amer: >60 mL/min (ref >60)
GFR calc non Af Amer: >60 mL/min (ref >60)
GLUCOSE: 277 mg/dL — AB (ref 65–99)
Glucose, Bld: 463 mg/dL — ABNORMAL HIGH (ref 65–99)
POTASSIUM: 5.7 mmol/L — AB (ref 3.5–5.1)
Potassium: 3.9 mmol/L (ref 3.5–5.1)
SODIUM: 130 mmol/L — AB (ref 135–145)
SODIUM: 136 mmol/L (ref 135–145)

## 2016-01-26 LAB — GLUCOSE, CAPILLARY
GLUCOSE-CAPILLARY: 183 mg/dL — AB (ref 65–99)
GLUCOSE-CAPILLARY: 114 mg/dL — AB (ref 65–99)
Glucose-Capillary: 148 mg/dL — ABNORMAL HIGH (ref 65–99)
Glucose-Capillary: 262 mg/dL — ABNORMAL HIGH (ref 65–99)
Glucose-Capillary: 370 mg/dL — ABNORMAL HIGH (ref 65–99)
Glucose-Capillary: 443 mg/dL — ABNORMAL HIGH (ref 65–99)

## 2016-01-26 MED ORDER — NEPRO/CARBSTEADY PO LIQD
1000.0000 mL | ORAL | Status: DC
Start: 2016-01-26 — End: 2016-01-26
  Filled 2016-01-26 (×2): qty 1000

## 2016-01-26 MED ORDER — FLUCONAZOLE 200 MG PO TABS: 200.0000 mg | ORAL_TABLET | Freq: Every day | ORAL | Status: DC

## 2016-01-26 MED ORDER — INSULIN ASPART 100 UNIT/ML ~~LOC~~ SOLN: 0.0000 [IU] | SUBCUTANEOUS | Status: DC

## 2016-01-26 MED ORDER — SODIUM POLYSTYRENE SULFONATE 15 GM/60ML PO SUSP
30.0000 g | Freq: Once | ORAL | Status: AC
  Administered 2016-01-26: 30 g
  Filled 2016-01-26: qty 120

## 2016-01-26 NOTE — Discharge Summary (Signed)
[place]  MR#: [id number]  DOB:08/01/1986  Date of Admission: 01/17/2016 Date of Discharge: 01/26/2016  Attending Physician:MCCLUNG,JEFFREY T  Patient's PCP:FRY,STEPHEN A, MD  Consults:  PCCM  Disposition: D/C home w/ family and HH  Follow-up Appts:     Follow-up Information    Follow up with FRY,STEPHEN A, MD In 1 week.   Specialty:  Family Medicine   Contact information:   [place] [id number] [phone]       Tests Needing Follow-up: -recheck of K+ is suggested  -close monitoring of CBGs is suggested  -compliance w/ percussion vest should be encouraged  -consider formal testing as outpt for adrenal insuff after slow hydrocortisone taper   Discharge Diagnoses: Acute on Chronic respiratory failure with hypoxia Sepsis due to MDR Pseudomonas HCAP Funguria Multiple decubitus ulcers Chronic Hypotension  Hyponatremia Acute blood loss anemia? vs Anemia Chronic disease  Gastroparesis Nutrition DM I uncontrolled with complications Hyperkalemia Hypomagnesemia  Initial presentation: 30 y.o. 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  On 01/16/16, he spiked fever to 102 and mother reported that she suctioned thick yellow sputum from his trach. On 01/17/16, temp dropped to 101F but pt still had thick sputum along with mild SOB. Due to concern for developing PNA, his mother brought him to ED for further evaluation.  In ED, CXR was concerning for multifocal PNA. In addition, he was found to have a UTI and anemia with Hgb 6.5. Mother reported that he had a little ongoing blood loss from his sacral  wounds.  Hospital Course:  Acute on Chronic respiratory failure with hypoxia -Respiratory status significantly improved - stable on usual regimen of trach collar support during day and resting on PS 10cm / PEEP 5cm w/ FiO2 40% at night via vent at time of dc  Sepsis due to MDR Pseudomonas HCAP -Tracheal aspirate thick purulent greenish yellow color c/w Pseudomonas - culture positive MDR Pseudomonas -Completed 7 day treatment of Vancomycin, Cefepime on 4/17 -Physiotherapy vest BID (though pt often refuses use) - arrangements are being made for physiotherapy vest BID at home (still pending at time of d/c per CM) -NTS BID -4/17 Consulted infectious disease - patient colonized with MDR Pseudomonas  -it is our hope we will be able to decrease multiple hospitalizations will be use of physiotherapy vest BID, frequent deep suctioning through trach, and spirometry use  Funguria -Diflucan 200 mg daily for 14 days of treatment  Multiple decubitus ulcers -WOC recommended Aquacel during inpt stay - resume wound VAC as per usual routine after discharge home  Chronic Hypotension  -cont Midodrine TID  Hyponatremia -improved w/ resumption of steroids - cont hydrocortisone at time of d/c, w/ plan to slowly wean as outpt as was previously planned - consider formal testing as outpt for adrenal insuff   Acute blood loss anemia? vs Anemia Chronic disease  -transfused 1u PRBC this admit - Hgb stable   Gastroparesis -controlled during this hospital stay   Nutrition. -to resume usual home nutrition/TF schedule upon d/c - no complications regarding use of PEG during this admit   DM I uncontrolled with complications -[id number] 7.4 - CBG quite labile during this admit, in face of acute illness, and steroid use - pt and  family well versed on DM control at home - resume usual home regimen at time of d/c and advised to follow CBG closely   Hyperkalemia -felt to be related to adrenal insuff as well as CBG  issues - improved at time of d/c   Hypomagnesemia -resolved    Medication List    TAKE these medications        acetaminophen 325 MG tablet  Commonly known as:  TYLENOL  Take 2 tablets (650 mg total) by mouth every 6 (six) hours as needed for mild pain, moderate pain, fever or headache.     albuterol (2.5 MG/3ML) 0.083% nebulizer solution  Commonly known as:  PROVENTIL  Take 3 mLs (2.5 mg total) by nebulization every 2 (two) hours as needed for wheezing or shortness of breath.     albuterol (2.5 MG/3ML) 0.083% nebulizer solution  Commonly known as:  PROVENTIL  Take 3 mLs (2.5 mg total) by nebulization 4 (four) times daily.     baclofen 20 MG tablet  Commonly known as:  LIORESAL  Take 1 tablet (20 mg total) by mouth 4 (four) times daily.     bisacodyl 5 MG EC tablet  Generic drug:  bisacodyl  Take ONE (1) tablet by mouth twice daily as needed FOR MODERATE CONSTIPATION     buPROPion 150 MG 24 hr tablet  Commonly known as:  WELLBUTRIN XL  Take 1 tablet (150 mg total) by mouth daily.     camphor-menthol lotion  Commonly known as:  SARNA  Apply topically as needed for itching.     collagenase ointment  Commonly known as:  SANTYL  Apply 1 application topically every Monday, Wednesday, and Friday. Apply to necrotic tissue in the sacral wound only.     diclofenac sodium 1 % Gel  Commonly known as:  VOLTAREN  Apply 2 g topically 2 (two) times daily.     docusate 50 MG/5ML liquid  Commonly known as:  COLACE  Take 10 mLs (100 mg total) by mouth 2 (two) times daily.     fentaNYL 50 MCG/HR  Commonly known as:  DURAGESIC - dosed mcg/hr  Place 1 patch (50 mcg total) onto the skin every 3 (three) days.     ferrous sulfate 325 (65 FE) MG tablet  Take 1 tablet (325 mg total) by mouth daily with breakfast.     fluconazole 200 MG tablet  Commonly known as:  DIFLUCAN  Take 1 tablet (200 mg total) by mouth daily.     free water Soln  Place 200 mLs into feeding tube every 8  (eight) hours.     glucagon 1 MG injection  Commonly known as:  GLUCAGON EMERGENCY  INJECT INTO MUSCLE ONCE AS NEEDED     GLUCERNA Liqd  1,000 mLs by PEG Tube route continuous. 70 ml/hr     glucose blood test strip  Commonly known as:  ACCU-CHEK SMARTVIEW  Use to check blood sugar 4 times per day. Dx Code: E11.9. Appointment needed for further refills.     hydrocortisone 10 MG tablet  Commonly known as:  CORTEF  Take 1 tablet (10 mg total) by mouth every evening.     hydrocortisone 20 MG tablet  Commonly known as:  CORTEF  Take 1 tablet (20 mg total) by mouth every morning.     insulin glargine 100 UNIT/ML injection  Commonly known as:  LANTUS  Inject 0.15 mLs (15 Units total) into the skin at bedtime.     loratadine 10 MG  tablet  Commonly known as:  CLARITIN  Take 1 tablet (10 mg total) by mouth daily.     LORazepam 0.5 MG tablet  Commonly known as:  ATIVAN  Take 0.5 mg by mouth 3 (three) times daily as needed for anxiety. Reported on 11/02/2015     LYRICA 100 MG capsule  Generic drug:  pregabalin  Take ONE (1)  capsule by mouth three times daily (morning, afternoon, and evening) and Take TWO (2) capsules at bedtime     Magnesium Oxide 400 MG Caps  Take 1 capsule (400 mg total) by mouth 2 (two) times daily.     midodrine 10 MG tablet  Commonly known as:  PROAMATINE  Take 2 tablets (20 mg total) by mouth 3 (three) times daily with meals.     multivitamin with minerals Tabs tablet  Place 1 tablet into feeding tube daily.     mupirocin ointment 2 %  Commonly known as:  BACTROBAN  Apply to affected area twice a day as needed     NOVOLOG FLEXPEN 100 UNIT/ML FlexPen  Generic drug:  insulin aspart  Inject 0-10 Units into the skin 4 (four) times daily as needed for high blood sugar (CBG >200). Per sliding scale: CBG <200 0 units, 201-300 4 units, 301-400 6 units, >400 10 units     ondansetron 8 MG disintegrating tablet  Commonly known as:  ZOFRAN ODT  Place 1 tablet  (8 mg total) into feeding tube every 8 (eight) hours as needed for nausea or vomiting.     Oxycodone HCl 20 MG Tabs  Take 1 tablet (20 mg total) by mouth every 6 (six) hours as needed (pain).     pantoprazole sodium 40 mg/20 mL Pack  Commonly known as:  PROTONIX  Place 20 mLs (40 mg total) into feeding tube daily.     saccharomyces boulardii 250 MG capsule  Commonly known as:  FLORASTOR  Take 1 capsule (250 mg total) by mouth 2 (two) times daily.     SURE COMFORT INS SYR 1CC/29G 29G X 1/2" 1 ML Misc  Generic drug:  INSULIN SYRINGE 1CC/29G  Use As Directed     SURE COMFORT INS SYR 1CC/29G 29G X 1/2" 1 ML Misc  Generic drug:  INSULIN SYRINGE 1CC/29G  Use As Directed        Day of Discharge BP 76/52 mmHg  Pulse 90  Temp(Src) 98.1 F (36.7 C) (Oral)  Resp 20  Ht 5\' 8"  (1.727 m)  Wt 40.824 kg (90 lb)  BMI 13.69 kg/m2  SpO2 98%  Physical Exam: General: No acute respiratory distress on TC Lungs: Clear to auscultation bilaterally without wheezes or crackles - no signif trach d/c  Cardiovascular: Regular rate and rhythm without murmur gallop or rub normal S1 and S2 Abdomen: Nontender, nondistended, soft, bowel sounds positive, no rebound, no ascites, no appreciable mass Extremities: No significant cyanosis, clubbing, or edema bilateral lower extremities  Basic Metabolic Panel:  Recent Labs Lab 01/20/16 0318  01/22/16 0750 01/22/16 1802 01/23/16 0332 01/23/16 1051 01/24/16 0327 01/25/16 0222 01/25/16 1244 01/26/16 0325 01/26/16 1248  NA 137  < >  --   --  134* 134* 137  --  135 130* 136  K 4.9  < > 6.1* 5.9* 7.2* 5.8* 6.6*  --  4.9 5.7* 3.9  CL 106  < >  --   --  100* 102 101  --  97* 92* 96*  CO2 20*  < >  --   --  24 23 26   --  27 27 28   GLUCOSE 318*  < >  --   --  273* 404* 235*  --  218* 463* 277*  BUN 42*  < >  --   --  41* 43* 43*  --  32* 38* 44*  CREATININE 0.82  < >  --   --  0.68 0.77 0.71  --  0.52* 0.72 0.63  CALCIUM 9.3  < >  --   --  9.6 9.9 9.6  --   9.5 9.0 9.5  MG 1.8  < > 1.6* 2.3 2.0  --  1.8 1.7  --   --   --   PHOS 2.7  --   --   --   --   --   --   --   --   --   --   < > = values in this interval not displayed.  Liver Function Tests:  Recent Labs Lab 01/20/16 0318 01/21/16 0331 01/22/16 0339 01/23/16 0332 01/24/16 0327  AST 14* 16 22 17 18   ALT 14* 13* 17 18 21   ALKPHOS 119 104 106 114 111  BILITOT 0.4 0.4 0.4 0.4 0.4  PROT 7.1 6.9 6.9 7.3 7.0  ALBUMIN 2.2* 2.1* 2.3* 2.5* 2.4*   CBC:  Recent Labs Lab 01/21/16 0331 01/22/16 0339 01/23/16 0332 01/24/16 0327 01/25/16 0222 01/26/16 0325  WBC 20.3* 25.2* 24.6* 25.5* 32.9* 31.1*  NEUTROABS 15.3* 20.4* 19.6* 21.7* 28.3*  --   HGB 8.3* 9.2* 9.2* 9.0* 9.0* 9.0*  HCT 26.2* 29.9* 30.2* 29.5* 30.6* 30.0*  MCV 84.8 86.2 87.3 87.3 88.4 87.5  PLT 393 456* 501* 563* 615* 642*   CBG:  Recent Labs Lab 01/25/16 1212 01/25/16 1658 01/25/16 2324 01/26/16 0738 01/26/16 1136  GLUCAP 199* 186* 183* 443* 370*    Recent Results (from the past 240 hour(s))  Blood culture (routine x 2)     Status: None   Collection Time: 01/17/16  5:00 PM  Result Value Ref Range Status   Specimen Description BLOOD RIGHT FOREARM  Final   Special Requests BOTTLES DRAWN AEROBIC AND ANAEROBIC 5CC  Final   Culture NO GROWTH 5 DAYS  Final   Report Status 01/22/2016 FINAL  Final  Blood culture (routine x 2)     Status: None   Collection Time: 01/17/16  5:15 PM  Result Value Ref Range Status   Specimen Description BLOOD LEFT ANTECUBITAL  Final   Special Requests IN PEDIATRIC BOTTLE 1CC  Final   Culture NO GROWTH 5 DAYS  Final   Report Status 01/22/2016 FINAL  Final  Urine culture     Status: None   Collection Time: 01/17/16  7:32 PM  Result Value Ref Range Status   Specimen Description URINE, CATHETERIZED  Final   Special Requests Normal  Final   Culture MULTIPLE SPECIES PRESENT, SUGGEST RECOLLECTION  Final   Report Status 01/19/2016 FINAL  Final  MRSA PCR Screening     Status:  Abnormal   Collection Time: 01/17/16 11:50 PM  Result Value Ref Range Status   MRSA by PCR POSITIVE (A) NEGATIVE Final    Comment:        The GeneXpert MRSA Assay (FDA approved for NASAL specimens only), is one component of a comprehensive MRSA colonization surveillance program. It is not intended to diagnose MRSA infection nor to guide or monitor treatment for MRSA infections. RESULT CALLED TO, READ BACK BY AND VERIFIED WITH: MELVIN,RN ON 2H @0311  01/18/16  MKELLYM   Culture, respiratory (NON-Expectorated)     Status: None   Collection Time: 01/19/16  3:23 PM  Result Value Ref Range Status   Specimen Description TRACHEAL ASPIRATE  Final   Special Requests NONE  Final   Gram Stain   Final    ABUNDANT WBC PRESENT, PREDOMINANTLY PMN FEW SQUAMOUS EPITHELIAL CELLS PRESENT FEW GRAM POSITIVE COCCI IN PAIRS Performed at [facility]    Culture   Final    FEW PSEUDOMONAS AERUGINOSA Note: MULTI DRUG RESISTANT ORGANISM COLISTIN 1.5ug/ML SENSITIVE ETEST results for this drug are "FOR INVESTIGATIONAL USE ONLY" and should NOT be used for clinical purposes. CRITICAL RESULT CALLED TO, READ BACK BY AND VERIFIED WITH: Erin RN 10AM  01/23/16 GUSTK Performed at [facility]    Report Status 01/23/2016 FINAL  Final   Organism ID, Bacteria PSEUDOMONAS AERUGINOSA  Final      Susceptibility   Pseudomonas aeruginosa - MIC*    CEFEPIME RESISTANT      CEFTAZIDIME RESISTANT      CIPROFLOXACIN RESISTANT      GENTAMICIN RESISTANT      IMIPENEM RESISTANT      PIP/TAZO RESISTANT      TICAR/CLAVULANIC RESISTANT      TOBRAMYCIN RESISTANT      AMIKACIN 16 SENSITIVE Sensitive     * FEW PSEUDOMONAS AERUGINOSA  Culture, Urine     Status: Abnormal   Collection Time: 01/19/16  6:41 PM  Result Value Ref Range Status   Specimen Description URINE, CATHETERIZED  Final   Special Requests NONE  Final   Culture >=100,000 COLONIES/mL YEAST (A)  Final   Report Status 01/21/2016 FINAL  Final     Time spent in discharge (includes decision making & examination of pt): >35 minutes  01/26/2016, 2:47 PM   Cherene Altes, MD [facility] Office  [phone] Pager [phone]  On-Call/Text Page:      [email or web address]      password [facility]

## 2016-01-26 NOTE — Progress Notes (Signed)
Patient wanted to be removed from ventilator and placed back on 28% trach collar.

## 2016-01-26 NOTE — Progress Notes (Addendum)
Nutrition Follow-up  DOCUMENTATION CODES:   Not applicable  INTERVENTION:   -Continue Nepro with Carb Steady @ 20 ml/hr via PEG and increase by 10 ml every 4 hours to goal rate of 80 ml/hr over 12 hour period  Tube feeding regimen provides 1728 kcal (100% of needs), 78 grams of protein, and 698 ml of H2O. Also provides 1 gram K daily (26 mEq) and 155 grams carbohydrate daily  -Enocourage PO intake as able  NUTRITION DIAGNOSIS:   Inadequate oral intake related to nausea, poor appetite as evidenced by per patient/family report, percent weight loss.  Ongoing  GOAL:   Patient will meet greater than or equal to 90% of their needs  Progressing  MONITOR:   PO intake, Labs, TF tolerance, I & O's, Skin  REASON FOR ASSESSMENT:   Consult Assessment of nutrition requirement/status  ASSESSMENT:   Jason [name] is a 30 y.o. 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 He is well known to our service from multiple prior admissions. He has hx of MDR pseudomonas and MRSA. He had recent prolonged admission 12/06/15 through 01/05/16 for worsening hypoxic respiratory failure due to HCAP. He was discharged home and reports that he had done well since discharge.  Pt on trach collar. Noted pt was placed on the vent last night.  Pt receiving nursing care and consuming a bowl of oatmeal for breakfast at time of visit. Meal completion 15-50% (averaging about 25% meal completion). Pt receives the majority of his nutrition via PEG.   TF regimen on 01/23/16 was adjusted due to hyperkalemia.  Pt receivingNepro with Carb Steady @ 20 ml/hr via PEG and increase by 10 ml every 4 hours to goal rate of 80 ml/hr over 12 hour period. Tube feeding regimen provides 1728 kcal (100% of needs), 78 grams of protein, and 698 ml of H2O. Also provides 1 gram K daily (26 mEq) and 155 grams  carbohydrate daily.   Per MD notes, pt to be discharged today. Per RNCM note, pulmonary vest can be delivered to pt home for use (pt has refused pressure support the past two nights).   Labs reviewed. K WDL.   Diet Order:  Diet heart healthy/carb modified Room service appropriate?: Yes; Fluid consistency:: Thin  Skin:  Wound (see comment) (stage IV rt/lt ischium and sacrum, FT lt plantar and outer )  Last BM:  01/25/16  Height:   Ht Readings from Last 1 Encounters:  01/18/16 5\' 8"  (1.727 m)    Weight:   Wt Readings from Last 1 Encounters:  01/26/16 90 lb (40.824 kg)    Ideal Body Weight:  70 kg  BMI:  Body mass index is 13.69 kg/(m^2).  Estimated Nutritional Needs:   Kcal:  1600-1800  Protein:  90-110 gm  Fluid:  >/= 1.7L  EDUCATION NEEDS:   No education needs identified at this time  Arabelle Bollig [name], RD, LDN, CDE Pager: [phone] After hours Pager: [phone]

## 2016-01-26 NOTE — Progress Notes (Signed)
Placed patient on pressure support of 10cm and peep of 5cm and fio2=40% as per order.

## 2016-01-26 NOTE — Progress Notes (Addendum)
Spoke w dr Thereasa Solo. He plans to dc pt this afternoon if cbg stable. He is aware that we do not have approval for pulm vest yet but md states did not wear last 2 nites so will disch and if gets approved they can deliver to home. Pt and dad aware that pt to be dc later today. Spoke w deb tucker at [facility] that pt for afternoon disch. Will fax dc summary when done. Will need ambulance transport. 1510pm have faxed dc summary, resum orders,mar to [facility]. Spoke w mother and she is ready for pt to be transported home. Transport form on shadow chart. Deb tucker w [facility] ready w home nurses. Mother knows [facility] still working on pul vest and when approved they can bring to them. md felt did not need to wait on vest.

## 2016-01-26 NOTE — Progress Notes (Signed)
Patient refused CPT at this time. Lung fields clear

## 2016-01-26 NOTE — Progress Notes (Signed)
Pt requests not to do CPT at this time.  BIL BS Clear.

## 2016-01-26 NOTE — Telephone Encounter (Addendum)
Pt will be discharge today from hospital and dad is requesting rx for dilaudid. Dad will pick up tomorrow

## 2016-01-26 NOTE — Progress Notes (Signed)
Name: Jason [name] MRN: [id number] DOB: [birth year]    ADMISSION DATE:  01/17/2016 CONSULTATION DATE:  01/17/16  REFERRING MD :  EDP  CHIEF COMPLAINT:  SOB   HISTORY OF PRESENT ILLNESS:  Jason [name] is a 30 y.o. male well known to our service from multiple prior admissions.  He has hx of MDR pseudomonas and MRSA.  He had recent prolonged admission 12/06/15 through 01/05/16 for worsening hypoxic respiratory failure due to HCAP.  On 01/16/16, he spiked fever to 102 and mother reports that she suctioned thick yellow sputum from pt's trach. He was admitted for multifocal PNA He was initially placed on full vent support in ED, but shortly after asked to come off so that he could eat.  During my exam, he is resting comfortably, has PMV in and is eating dinner without problems.  He denies any chest pain, SOB, N/V/D, abd pain, myalgias.  He does have persistent back pain, unchanged from baseline.  He is requesting to go home.  SUBJECTIVE:   Doing trache collar in am 28% and nocturnal vent when he feels wearing went. He did NOT want CPT last night. Less trache secretions.    VITAL SIGNS: Temp:  [98 F (36.7 C)-100.6 F (38.1 C)] 98.1 F (36.7 C) (04/20 1138) Pulse Rate:  [86-106] 86 (04/20 1126) Resp:  [12-30] 17 (04/20 1126) BP: (94-180)/(63-105) 100/70 mmHg (04/20 1100) SpO2:  [90 %-100 %] 100 % (04/20 1126) FiO2 (%):  [28 %-40 %] 28 % (04/20 1126) Weight:  [90 lb (40.824 kg)] 90 lb (40.824 kg) (04/20 0355)  PHYSICAL EXAMINATION: General: Young adult AA male, lethargic but arousble on the vent. Neuro: A&O x 3, non-focal.  HEENT: [place]/AT. PERRL, sclerae anicteric. Trach site clean. Cardiovascular: RRR, no M/R/G.  Lungs: Respirations even and unlabored.  Coarse bilateral bases  Abdomen: BS x 4, soft, NT/ND.  Musculoskeletal: Muscle wasting throughout, bilateral ankles in pressure relief boots. Skin: Intact, warm, no rashes.  Recent Labs Lab 01/24/16 0327 01/25/16 1244  01/26/16 0325  NA 137 135 130*  K 6.6* 4.9 5.7*  CL 101 97* 92*  CO2 26 27 27   BUN 43* 32* 38*  CREATININE 0.71 0.52* 0.72  GLUCOSE 235* 218* 463*    Recent Labs Lab 01/24/16 0327 01/25/16 0222 01/26/16 0325  HGB 9.0* 9.0* 9.0*  HCT 29.5* 30.6* 30.0*  WBC 25.5* 32.9* 31.1*  PLT 563* 615* 642*   No results found. STUDIES:  CXR 04/11 > multifocal PNA.  CULTURES: Blood 04/11 >NTD Urine 04/11 >NTD Sputum 04/11 >Pseudomonas MDRO, only sensitive to Amikacin  ANTIBIOTICS: Vanc 04/11 >  Cefepime 04/11 > Diflucan 04/11 >  SIGNIFICANT EVENTS  04/11 > admitted with HCAP.  LINES / TUBES: Chronic trach / PEG >   ASSESSMENT / PLAN:  Chronic respiratory failure - s/p tracheostomy.   Pseudomonas - MDRO - tracheitis/colonizer Tracheostomy status. Hx MDR pseudomonas and MRSA PNA Plan: Continue trach collar as prescribed in daytime. Cont nocturnal vent.  Continue outpatient albuterol. Pulmonary hygiene. CPT in house  or the vest at home if he wants to > refused cpt last night. Off abx >> MDR PSA being treated as a colonizer. Has limited abx options. Observe for now.  Noted plans to dc pt today if sugar gets better. PCCM will sign off. Pls call back if with issues.    Monica Becton, MD 01/26/2016, 12:42 PM [facility] Pager [phone] After 3 pm or if no answer,  call [phone]

## 2016-01-26 NOTE — Discharge Instructions (Signed)
Respiratory failure is when your lungs are not working well and your breathing (respiratory) system fails. When respiratory failure occurs, it is difficult for your lungs to get enough oxygen, get rid of carbon dioxide, or both. Respiratory failure can be life threatening.  °Respiratory failure can be acute or chronic. Acute respiratory failure is sudden, severe, and requires emergency medical treatment. Chronic respiratory failure is less severe, happens over time, and requires ongoing treatment.  °WHAT ARE THE CAUSES OF ACUTE RESPIRATORY FAILURE?  °Any problem affecting the heart or lungs can cause acute respiratory failure. Some of these causes include the following: °· Chronic bronchitis and emphysema (COPD).   °· Blood clot going to a lung (pulmonary embolism).   °· Having water in the lungs caused by heart failure, lung injury, or infection (pulmonary edema).   °· Collapsed lung (pneumothorax).   °· Pneumonia.   °· Pulmonary fibrosis.   °· Obesity.   °· Asthma.   °· Heart failure.   °· Any type of trauma to the chest that can make breathing difficult.   °· Nerve or muscle diseases making chest movements difficult. °HOW WILL MY ACUTE RESPIRATORY FAILURE BE TREATED?  °Treatment of acute respiratory failure depends on the cause of the respiratory failure. Usually, you will stay in the intensive care unit so your breathing can be watched closely. Treatment can include the following: °· Oxygen. Oxygen can be delivered through the following: °¨ Nasal cannula. This is small tubing that goes in your nose to give you oxygen. °¨ Face mask. A face mask covers your nose and mouth to give you oxygen. °· Medicine. Different medicines can be given to help with breathing. These can include: °¨ Nebulizers. Nebulizers deliver medicines to open the air passages (bronchodilators). These medicines help to open or relax the airways in the lungs so you can breathe better. They can also help loosen mucus from your  lungs. °¨ Diuretics. Diuretic medicines can help you breathe better by getting rid of extra water in your body. °¨ Steroids. Steroid medicines can help decrease swelling (inflammation) in your lungs. °¨ Antibiotics. °· Chest tube. If you have a collapsed lung (pneumothorax), a chest tube is placed to help reinflate the lung. °· Noninvasive positive pressure ventilation (NPPV). This is a tight-fitting mask that goes over your nose and mouth. The mask has tubing that is attached to a machine. The machine blows air into the tubing, which helps to keep the tiny air sacs (alveoli) in your lungs open. This machine allows you to breathe on your own. °· Ventilator. A ventilator is a breathing machine. When on a ventilator, a breathing tube is put into the lungs. A ventilator is used when you can no longer breathe well enough on your own. You may have low oxygen levels or high carbon dioxide (CO2) levels in your blood. When you are on a ventilator, sedation and pain medicines are given to make you sleep so your lungs can heal. °SEEK IMMEDIATE MEDICAL CARE IF: °· You have shortness of breath (dyspnea) with or without activity. °· You have rapid breathing (tachypnea). °· You are wheezing. °· You are unable to say more than a few words without having to catch your breath. °· You find it very difficult to function normally. °· You have a fast heart rate. °· You have a bluish color to your finger or toe nail beds. °· You have confusion or drowsiness or both. °  °This information is not intended to replace advice given to you by your health care provider. Make sure you discuss   any questions you have with your health care provider. °  °Document Released: 09/29/2013 Document Revised: 06/15/2015 Document Reviewed: 09/29/2013 °Elsevier Interactive Patient Education ©2016 [employer] Inc. ° °

## 2016-01-26 NOTE — Progress Notes (Signed)
[facility] Progress Note Patient Name: PHAT REBAR DOB: 1986-01-02 MRN: [id number]   Date of Service  01/26/2016  HPI/Events of Note  K=5.7, fsbs>400  eICU Interventions  Started SSI and given kayoxelate     Intervention Category Major Interventions: Hyperglycemia - active titration of insulin therapy  Flora Lipps 01/26/2016, 6:56 AM

## 2016-01-26 NOTE — Progress Notes (Signed)
Inpatient Diabetes Program Recommendations  AACE/ADA: New Consensus Statement on Inpatient Glycemic Control (2015)  Target Ranges:  Prepandial:   less than 140 mg/dL      Peak postprandial:   less than 180 mg/dL (1-2 hours)      Critically ill patients:  140 - 180 mg/dL   Review of Glycemic Control Results for [name], [name] (MRN [id number]) as of 01/26/2016 08:53  Ref. Range 01/25/2016 03:48 01/25/2016 07:55 01/25/2016 12:12 01/25/2016 16:58 01/25/2016 23:24  Glucose-Capillary Latest Ref Range: 65-99 mg/dL 186 (H) 72 199 (H) 186 (H) 183 (H)  Results for Jason [name], Jason [name] (MRN [id number]) as of 01/26/2016 08:53  Ref. Range 01/26/2016 03:25  Glucose Latest Ref Range: 65-99 mg/dL 463 (H)   Diabetes history: Type 1 diabetes Outpatient Diabetes medications: Lantus 15 units daily, Novolog 0-10 units four times daily Current orders for Inpatient glycemic control:  Lantus 22 units daily, Novolog 6 units q 4 hours, Novolog resistant scale decreased to sensitive scale 0-9 units but is ac & hs  Inpatient Diabetes Program Recommendations: Noted patient did not receive tubefeeding coverage or CBGs with correction  during the night and lab glucose this am 463. Spoke with RN Evelena Peat need for sensitive scale q 4 hrs. Patient eating oatmeal for breakfast with tubefeedings continued @ present.   Thank you, Nani Gasser. Cherrish Vitali, RN, MSN, CDE Inpatient Glycemic Control Team Team Pager [phone] (8am-5pm) 01/26/2016 9:29 AM

## 2016-01-26 NOTE — Progress Notes (Signed)
Talked with patient about placing him on pressure support over night as what is ordered. Patient in no distress at this time on 28% ATC. Patient wants to wait until the basketball game is over. After the game patient will be placed on pressure suportt as ordered

## 2016-01-27 MED ORDER — HYDROMORPHONE HCL 8 MG PO TABS: 8.0000 mg | ORAL_TABLET | Freq: Four times a day (QID) | ORAL | Status: DC | PRN

## 2016-01-27 NOTE — Telephone Encounter (Signed)
done

## 2016-01-27 NOTE — Telephone Encounter (Signed)
Script is ready and I left a voice message for dad.

## 2016-02-07 ENCOUNTER — Telehealth: Payer: Self-pay | Admitting: Family Medicine

## 2016-02-07 MED ORDER — DOXYCYCLINE HYCLATE 100 MG PO TABS: 100.0000 mg | ORAL_TABLET | Freq: Two times a day (BID) | ORAL | Status: DC

## 2016-02-07 NOTE — Telephone Encounter (Signed)
Pt running a low grade fever, urine module some what cloudy possible developing UTI.  Would like to have a call back to see if he will need a antibiotic or culture test done.

## 2016-02-07 NOTE — Telephone Encounter (Signed)
I sent script e-scribe and spoke with [facility], they will notify mom.

## 2016-02-07 NOTE — Telephone Encounter (Signed)
Call in Doxycycline 100 mg bid for 10 days  

## 2016-02-08 ENCOUNTER — Other Ambulatory Visit: Payer: Self-pay | Admitting: Physical Medicine & Rehabilitation

## 2016-02-10 ENCOUNTER — Telehealth: Payer: Self-pay | Admitting: Family Medicine

## 2016-02-10 MED ORDER — OXYCODONE HCL 20 MG PO TABS: 20.0000 mg | ORAL_TABLET | Freq: Four times a day (QID) | ORAL | Status: DC | PRN

## 2016-02-10 NOTE — Telephone Encounter (Signed)
done

## 2016-02-10 NOTE — Telephone Encounter (Signed)
Pt has all his teeth extracted at [facility] yesterday and needs new rx oxycodone due to per dad he will be in severe pain. Please call dad with any questions

## 2016-02-10 NOTE — Telephone Encounter (Signed)
Script is ready for pick up and I spoke with dad.

## 2016-02-13 ENCOUNTER — Telehealth: Payer: Self-pay | Admitting: Family Medicine

## 2016-02-13 DIAGNOSIS — L89159 Pressure ulcer of sacral region, unspecified stage: Secondary | ICD-10-CM

## 2016-02-13 DIAGNOSIS — M869 Osteomyelitis, unspecified: Secondary | ICD-10-CM

## 2016-02-13 MED ORDER — HYDROCORTISONE 20 MG PO TABS: 20.0000 mg | ORAL_TABLET | Freq: Every morning | ORAL | Status: DC

## 2016-02-13 MED ORDER — HYDROCORTISONE 10 MG PO TABS: 10.0000 mg | ORAL_TABLET | Freq: Every evening | ORAL | Status: DC

## 2016-02-13 MED ORDER — ALBUTEROL SULFATE (2.5 MG/3ML) 0.083% IN NEBU: 2.5000 mg | INHALATION_SOLUTION | Freq: Four times a day (QID) | RESPIRATORY_TRACT | Status: DC

## 2016-02-13 MED ORDER — FENTANYL 50 MCG/HR TD PT72: 50.0000 ug | MEDICATED_PATCH | TRANSDERMAL | Status: DC

## 2016-02-13 NOTE — Telephone Encounter (Signed)
Pt request refill   albuterol (PROVENTIL) (2.5 MG/3ML) 0.083% nebulizer solution hydrocortisone (CORTEF) 10 MG tablet hydrocortisone (CORTEF) 20 MG tablet  [place] These are meds pt was put on in the hospital, and now they are going to be out.  fentaNYL (DURAGESIC - DOSED MCG/HR) 50 MCG/HR  Mom states hospital the hospital had ordered a pulmonary desk ordered.  Mom states [facility] has the order but states they are waiting on approval. Would like to know if you can help.

## 2016-02-13 NOTE — Telephone Encounter (Signed)
Call in albuterol vials, 75 ml with 11 rf, also Cortef 20 mg #30 with 11 rf, and Cortef 10 mg #30 with 11 rf. The Fentanyl is ready to pick up

## 2016-02-13 NOTE — Telephone Encounter (Signed)
I sent all 3 scripts e-scribe and fentanyl script is ready for pick up here at front office, also spoke with mom.

## 2016-02-14 ENCOUNTER — Telehealth: Payer: Self-pay | Admitting: Family Medicine

## 2016-02-14 ENCOUNTER — Ambulatory Visit: Payer: Medicaid Other | Admitting: Family Medicine

## 2016-02-14 MED ORDER — CIPROFLOXACIN HCL 500 MG PO TABS: 500.0000 mg | ORAL_TABLET | Freq: Two times a day (BID) | ORAL | Status: DC

## 2016-02-14 NOTE — Telephone Encounter (Signed)
I spoke nurse and pharmacy, gave the below message. Home care nurse will contact infectious disease.

## 2016-02-14 NOTE — Telephone Encounter (Signed)
Mom called back to advise the cipro is for a UTI. Pt has UTI, mom has a few left, but not enough for a 7 day course.' home health needs an order to give to pt anyway.

## 2016-02-14 NOTE — Telephone Encounter (Signed)
Pharmacy is calling to ask about the Cipro rx.   Advised [facility] we will give them a call.

## 2016-02-14 NOTE — Telephone Encounter (Signed)
They need to direct any questions about outpatient antibiotics to Infectious Disease

## 2016-02-14 NOTE — Addendum Note (Signed)
Addended by: Aggie Hacker A on: 02/14/2016 02:48 PM   Modules accepted: Orders

## 2016-02-14 NOTE — Telephone Encounter (Signed)
They have received the Rx for the catheter. Need to have office notes or letter of medical neccesity to support the two foley a week the insurance will only cover one a week. Pleas fax the Rx over to [phone].

## 2016-02-14 NOTE — Telephone Encounter (Signed)
Per Dr. Sarajane Jews call in Cipro 500 mg take bid for 7 days. I sent script e-scribe to [facility], spoke to [facility] and also mom.

## 2016-02-14 NOTE — Telephone Encounter (Signed)
Jason [name] would like to know if they should hold cipro and just give clindamycin  Per post discharge instruction .

## 2016-02-15 ENCOUNTER — Telehealth: Payer: Self-pay | Admitting: Family Medicine

## 2016-02-15 NOTE — Telephone Encounter (Signed)
I spoke with Jason [name] and she is going to fax a order sheet to reflect new changes.

## 2016-02-15 NOTE — Telephone Encounter (Signed)
[name] from [facility] would like a call back concerning a letter of neccesties  [phone]

## 2016-02-15 NOTE — Telephone Encounter (Signed)
I faxed to below number.

## 2016-02-15 NOTE — Telephone Encounter (Signed)
The letter is ready to send

## 2016-02-18 ENCOUNTER — Other Ambulatory Visit: Payer: Self-pay | Admitting: Family Medicine

## 2016-02-20 ENCOUNTER — Other Ambulatory Visit: Payer: Self-pay

## 2016-02-20 MED ORDER — SACCHAROMYCES BOULARDII 250 MG PO CAPS: 250.0000 mg | ORAL_CAPSULE | Freq: Two times a day (BID) | ORAL | Status: DC

## 2016-02-21 ENCOUNTER — Telehealth: Payer: Self-pay | Admitting: Family Medicine

## 2016-02-21 NOTE — Telephone Encounter (Signed)
I spoke with Jason [name] at [facility], we are waiting on form to sign.

## 2016-02-21 NOTE — Telephone Encounter (Signed)
Please call this order in  

## 2016-02-21 NOTE — Telephone Encounter (Signed)
Jason [name] with [facility] needs new orders for pt 24 hour nursing care while dad has back surgery.   [facility] had requested this earlier last month, however dad's back surgery had been postponed until 5/24. Now new orders are needed.  The 24 hour nursing needs to start 5/23, Mr Tetterton will be admitted this day with surgery 5/24. insurance will only approve 4 weeks.  Jason [name] is going to be sending a fax with this information, just wanted to give Dr Sarajane Jews a heads up

## 2016-02-27 ENCOUNTER — Telehealth: Payer: Self-pay | Admitting: Internal Medicine

## 2016-02-27 ENCOUNTER — Encounter: Payer: Self-pay | Admitting: Family Medicine

## 2016-02-27 ENCOUNTER — Ambulatory Visit (INDEPENDENT_AMBULATORY_CARE_PROVIDER_SITE_OTHER): Payer: Medicaid Other | Admitting: Family Medicine

## 2016-02-27 VITALS — BP 110/77 | HR 90 | Temp 99.4°F

## 2016-02-27 DIAGNOSIS — D649 Anemia, unspecified: Secondary | ICD-10-CM

## 2016-02-27 DIAGNOSIS — J9611 Chronic respiratory failure with hypoxia: Secondary | ICD-10-CM

## 2016-02-27 DIAGNOSIS — I9589 Other hypotension: Secondary | ICD-10-CM

## 2016-02-27 DIAGNOSIS — M797 Fibromyalgia: Secondary | ICD-10-CM | POA: Diagnosis not present

## 2016-02-27 DIAGNOSIS — Z930 Tracheostomy status: Secondary | ICD-10-CM

## 2016-02-27 DIAGNOSIS — G8220 Paraplegia, unspecified: Secondary | ICD-10-CM

## 2016-02-27 MED ORDER — FENTANYL 75 MCG/HR TD PT72: 75.0000 ug | MEDICATED_PATCH | TRANSDERMAL | Status: DC

## 2016-02-27 MED ORDER — OXYCODONE HCL 20 MG PO TABS: 20.0000 mg | ORAL_TABLET | Freq: Four times a day (QID) | ORAL | Status: DC | PRN

## 2016-02-27 NOTE — Progress Notes (Signed)
   Subjective:    Patient ID: Jason [name], male    DOB: 06-29-1986, 30 y.o.   MRN: [id number]  HPI Here with father to follow up on multiple issues. He has been hospitalized several times this year for sepsis issues, sometime stemming from skin infections and sometimes from UTI's. He has been doing better lately. He sees Pulmonology, Infectious Disease, and Endocrine regularly. His pain control has been spotty and he asks to increase the dose of his Fentyl patches if possible.    Review of Systems  Constitutional: Negative.   Respiratory: Positive for shortness of breath. Negative for choking, chest tightness and wheezing.   Cardiovascular: Negative.   Endocrine: Negative.        Objective:   Physical Exam  Constitutional:  Alert, in his wheelchair.   Neck: Neck supple. No thyromegaly present.  Cardiovascular: Normal rate, regular rhythm, normal heart sounds and intact distal pulses.   Pulmonary/Chest: Effort normal and breath sounds normal.  Lymphadenopathy:    He has no cervical adenopathy.  Neurological: He is alert.  Skin: No rash noted.          Assessment & Plan:  He seems to be stable for now with chronic issues related to his quadriplegia. We will increase the Fentanyl patches to 75 mcg every 3 days and supplement with Oxycodone. Follow up in 3 months  [name], MD

## 2016-02-27 NOTE — Progress Notes (Signed)
Pre visit review using our clinic review tool, if applicable. No additional management support is needed unless otherwise documented below in the visit note. 

## 2016-02-27 NOTE — Telephone Encounter (Signed)
Spoke with [facility] and she states they do have order and were waiting on Ins Auth, which came today. She states that they will be contacting pt's mother to set up appt for home visit & fitting. Spoke with pt's mother and advised. Nothing further needed.

## 2016-03-01 NOTE — Telephone Encounter (Signed)
Can you please try to close this encounter? Both Dr. Sarajane Jews and myself have tried.

## 2016-03-01 NOTE — Telephone Encounter (Signed)
Opened in error

## 2016-03-08 ENCOUNTER — Other Ambulatory Visit: Payer: Self-pay | Admitting: Family Medicine

## 2016-03-10 ENCOUNTER — Other Ambulatory Visit: Payer: Self-pay | Admitting: Family Medicine

## 2016-03-12 ENCOUNTER — Telehealth: Payer: Self-pay | Admitting: Family Medicine

## 2016-03-12 NOTE — Telephone Encounter (Signed)
Please give a verbal order to do wet to dry dressings daily

## 2016-03-12 NOTE — Telephone Encounter (Signed)
Dulcy Fanny with [facility] states staff has been doing pt's wet to dry dressings 3 x week. [facility] does not think that is enough. However, they do not have an order at all for the dressing changes.  Haley recommends wet to dry be done daily, if pt will allow.  Regardless , they need an order from Dr Sarajane Jews. Verbal would be great.

## 2016-03-13 ENCOUNTER — Telehealth: Payer: Self-pay | Admitting: Family Medicine

## 2016-03-13 NOTE — Telephone Encounter (Signed)
Jason [name] would like to verbal  Order discontinue protonix, diflucan , nexium and needs a order for dexilant 60 mg#30 w/refills  . [name]

## 2016-03-13 NOTE — Telephone Encounter (Signed)
Notified [facility] of verbal orders

## 2016-03-14 ENCOUNTER — Other Ambulatory Visit: Payer: Self-pay

## 2016-03-14 MED ORDER — DEXLANSOPRAZOLE 60 MG PO CPDR: 60.0000 mg | DELAYED_RELEASE_CAPSULE | Freq: Every day | ORAL | Status: AC

## 2016-03-14 NOTE — Telephone Encounter (Signed)
Jason [name] was notified, rx was sent to pt's pharmacy.

## 2016-03-14 NOTE — Telephone Encounter (Signed)
Please advise thanks.

## 2016-03-14 NOTE — Telephone Encounter (Signed)
As above. Call in [place] for one year

## 2016-03-15 ENCOUNTER — Telehealth: Payer: Self-pay | Admitting: *Deleted

## 2016-03-15 NOTE — Telephone Encounter (Signed)
Received faxed prior authorization request from [facility] for [place] 60mg  cap.  Called [place], Rx approved for 365 days.  Approval [id number].

## 2016-03-17 ENCOUNTER — Other Ambulatory Visit: Payer: Self-pay | Admitting: Family Medicine

## 2016-03-19 NOTE — Telephone Encounter (Signed)
Last filled on 01/23/16 #150 + 1, last OV 02/27/16. Ok to refill?

## 2016-03-20 NOTE — Telephone Encounter (Signed)
Call in #150 with 5 rf 

## 2016-03-21 NOTE — Telephone Encounter (Signed)
Rx refill sent to pharmacy. 

## 2016-03-22 ENCOUNTER — Encounter (HOSPITAL_BASED_OUTPATIENT_CLINIC_OR_DEPARTMENT_OTHER): Payer: Medicaid Other | Attending: Internal Medicine

## 2016-03-22 DIAGNOSIS — L97521 Non-pressure chronic ulcer of other part of left foot limited to breakdown of skin: Secondary | ICD-10-CM | POA: Diagnosis not present

## 2016-03-22 DIAGNOSIS — L89154 Pressure ulcer of sacral region, stage 4: Secondary | ICD-10-CM | POA: Insufficient documentation

## 2016-03-22 DIAGNOSIS — L89893 Pressure ulcer of other site, stage 3: Secondary | ICD-10-CM | POA: Insufficient documentation

## 2016-03-22 DIAGNOSIS — Z8614 Personal history of Methicillin resistant Staphylococcus aureus infection: Secondary | ICD-10-CM | POA: Insufficient documentation

## 2016-03-22 DIAGNOSIS — L89324 Pressure ulcer of left buttock, stage 4: Secondary | ICD-10-CM | POA: Insufficient documentation

## 2016-03-22 DIAGNOSIS — E1040 Type 1 diabetes mellitus with diabetic neuropathy, unspecified: Secondary | ICD-10-CM | POA: Insufficient documentation

## 2016-03-22 DIAGNOSIS — L97429 Non-pressure chronic ulcer of left heel and midfoot with unspecified severity: Secondary | ICD-10-CM | POA: Diagnosis not present

## 2016-03-22 DIAGNOSIS — G8250 Quadriplegia, unspecified: Secondary | ICD-10-CM | POA: Insufficient documentation

## 2016-03-22 DIAGNOSIS — Z933 Colostomy status: Secondary | ICD-10-CM | POA: Diagnosis not present

## 2016-03-22 DIAGNOSIS — G40909 Epilepsy, unspecified, not intractable, without status epilepticus: Secondary | ICD-10-CM | POA: Insufficient documentation

## 2016-03-22 DIAGNOSIS — M797 Fibromyalgia: Secondary | ICD-10-CM | POA: Insufficient documentation

## 2016-03-22 DIAGNOSIS — J45909 Unspecified asthma, uncomplicated: Secondary | ICD-10-CM | POA: Insufficient documentation

## 2016-03-22 DIAGNOSIS — Z930 Tracheostomy status: Secondary | ICD-10-CM | POA: Diagnosis not present

## 2016-03-22 DIAGNOSIS — E10621 Type 1 diabetes mellitus with foot ulcer: Secondary | ICD-10-CM | POA: Diagnosis not present

## 2016-03-22 DIAGNOSIS — L89314 Pressure ulcer of right buttock, stage 4: Secondary | ICD-10-CM | POA: Insufficient documentation

## 2016-03-22 DIAGNOSIS — K219 Gastro-esophageal reflux disease without esophagitis: Secondary | ICD-10-CM | POA: Diagnosis not present

## 2016-03-26 ENCOUNTER — Telehealth: Payer: Self-pay | Admitting: Family Medicine

## 2016-03-26 NOTE — Telephone Encounter (Signed)
Patient Name: Jason [name]  DOB: 1986/05/14    Initial Comment Caller states son c/o chest burning   Nurse Assessment  Nurse: Raphael Gibney, RN, Vanita Ingles Date/Time (Eastern Time): 03/26/2016 1:33:02 PM  Confirm and document reason for call. If symptomatic, describe symptoms. You must click the next button to save text entered. ---Caller states son has GERD. Medication that he takes is not helping. Has used Tums and alka seltzer and he is also taking dexilant. no vomiting. No abd pain.  Has the patient traveled out of the country within the last 30 days? ---Not Applicable  Does the patient have any new or worsening symptoms? ---Yes  Will a triage be completed? ---Yes  Related visit to physician within the last 2 weeks? ---No  Does the PT have any chronic conditions? (i.e. diabetes, asthma, etc.) ---Yes  List chronic conditions. ---GERD, diabetes, trach, diabetes  Is this a behavioral health or substance abuse call? ---No     Guidelines    Guideline Title Affirmed Question Affirmed Notes  Chest Pain [1] Patient claims chest pain is same as previously diagnosed "heartburn" AND [2] describes burning in chest AND [3] accompanying sour taste in mouth    Final Disposition User   Call PCP within 69 Hours Stringer, RN, Vanita Ingles    Comments  Pt is bedbound and has a trach and mother has to make arrangements for an office visit. She is wanting to know what can be given in addition to the Dexilant to help with his reflux. Please call mother back regarding medication.   Referrals  REFERRED TO PCP OFFICE   Disagree/Comply: Disagree  Disagree/Comply Reason: Unable to find transportation

## 2016-03-27 ENCOUNTER — Telehealth: Payer: Self-pay | Admitting: *Deleted

## 2016-03-27 NOTE — Telephone Encounter (Signed)
Since he is failing the typical treatments we offer, I think he needs to see GI for special advice. We can refer if needed

## 2016-03-27 NOTE — Telephone Encounter (Signed)
Recieved prior authorization request for Lyrica 100mg  from [facility].  PA completed.  PA approved for 1 year, approval [id number].  Notified pharmacy.

## 2016-03-27 NOTE — Telephone Encounter (Signed)
I spoke with mom and went over below information.

## 2016-03-28 ENCOUNTER — Other Ambulatory Visit: Payer: Self-pay | Admitting: Family Medicine

## 2016-03-28 NOTE — Telephone Encounter (Signed)
Pt need new Rx for Hydromorphone 8 mg

## 2016-03-28 NOTE — Telephone Encounter (Signed)
NO he already has refilled until 05-28-16

## 2016-03-30 ENCOUNTER — Other Ambulatory Visit: Payer: Self-pay | Admitting: Family Medicine

## 2016-04-03 ENCOUNTER — Other Ambulatory Visit: Payer: Self-pay | Admitting: Family Medicine

## 2016-04-05 ENCOUNTER — Telehealth: Payer: Self-pay | Admitting: Family Medicine

## 2016-04-05 NOTE — Telephone Encounter (Signed)
Pt needs new rxs fentanyl and  oxycodone

## 2016-04-06 NOTE — Telephone Encounter (Signed)
NO he already has refills to last until  05-29-16

## 2016-04-08 IMAGING — CT CT L SPINE W/O CM
3 of 5 series · 14 of 33 positions shown, 16 images · non-contrast
Comparison: None.

CLINICAL DATA: Chronic neuropathy. Diabetes. Bilateral leg
numbness. Hyperglycemia.

EXAM:
CT LUMBAR SPINE WITHOUT CONTRAST
TECHNIQUE: Multidetector CT imaging of the lumbar spine was performed without
intravenous contrast administration. Multiplanar CT image
reconstructions were also generated.

[Series 5: spine 2.0 b31s st · axial · 0.29mm/px · z∈[-738,-562]mm · 8 of 105 slices shown, 10 images]
[im 9/105  soft-tissue]
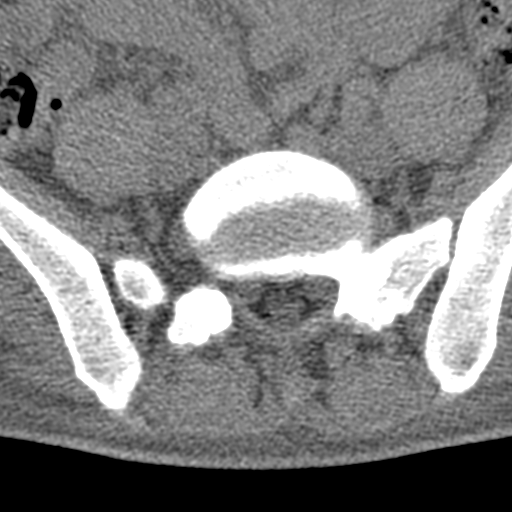
[im 9/105  bone]
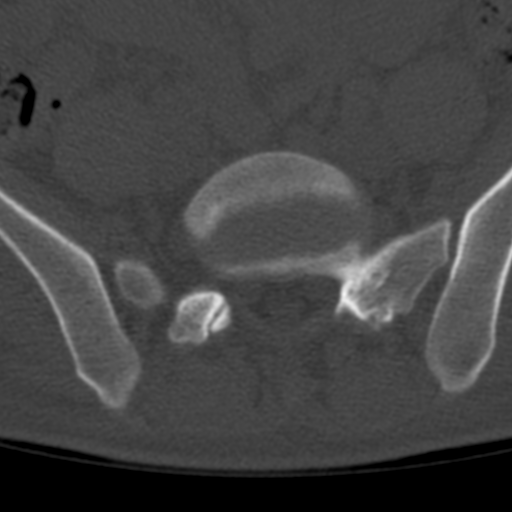
[im 25/105  bone]
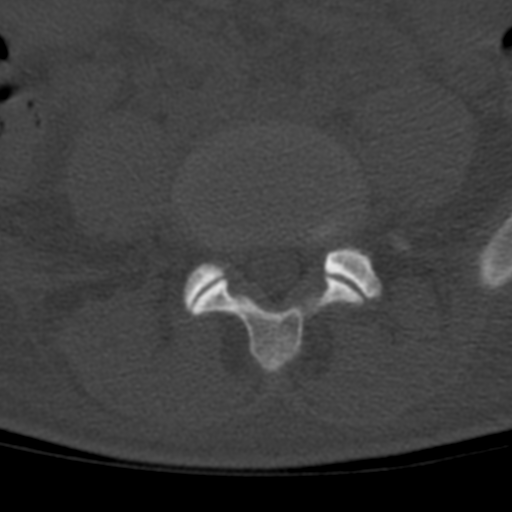
[im 33/105  bone]
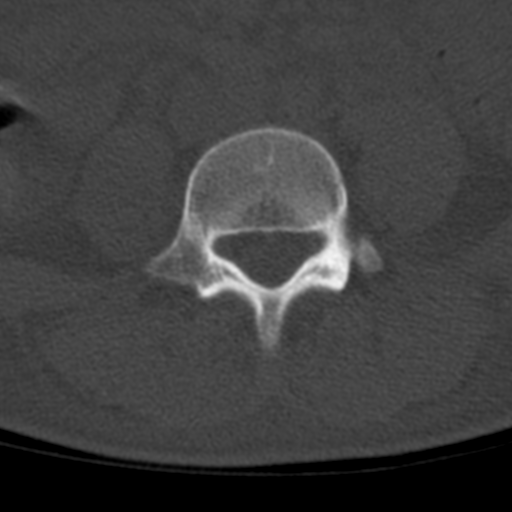
[im 49/105  bone]
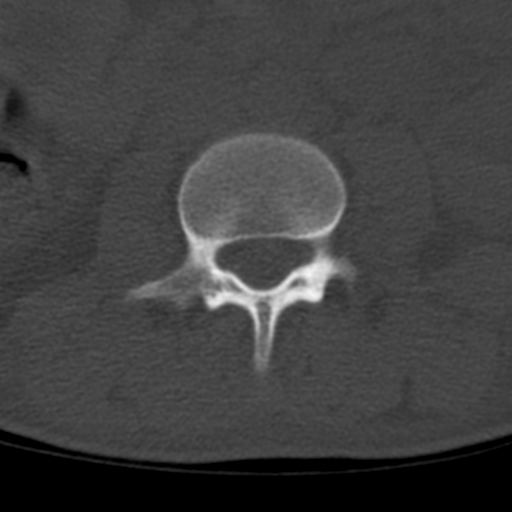
[im 57/105  soft-tissue]
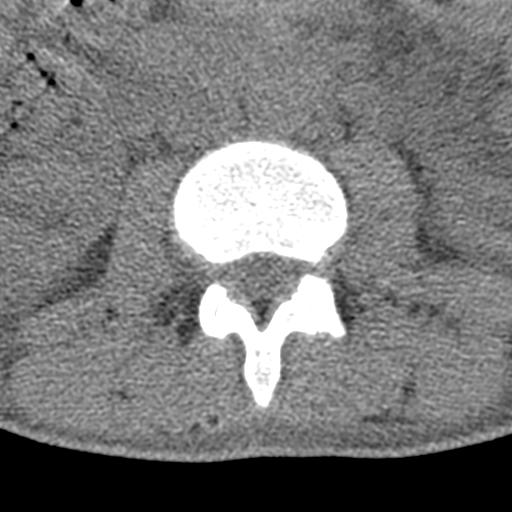
[im 57/105  bone]
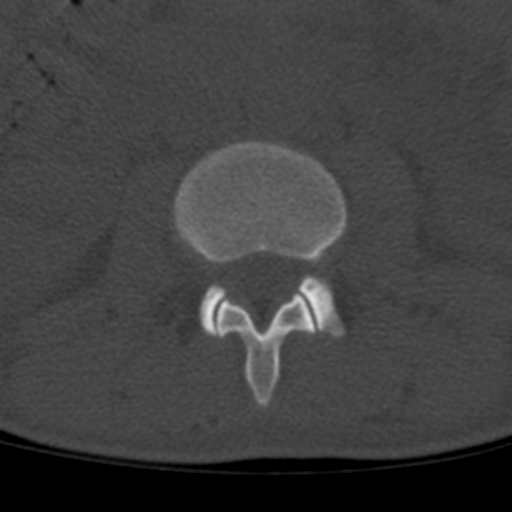
[im 73/105  bone]
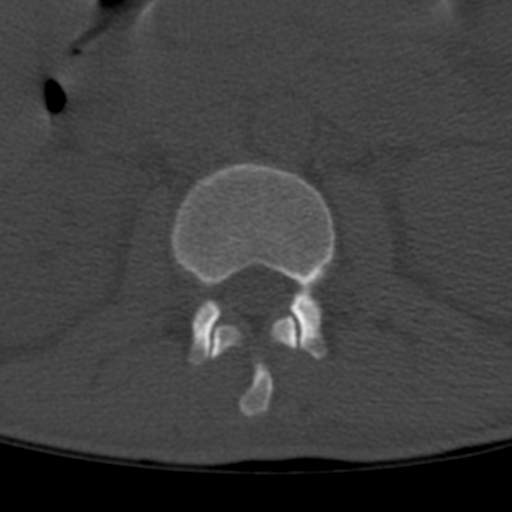
[im 81/105  bone]
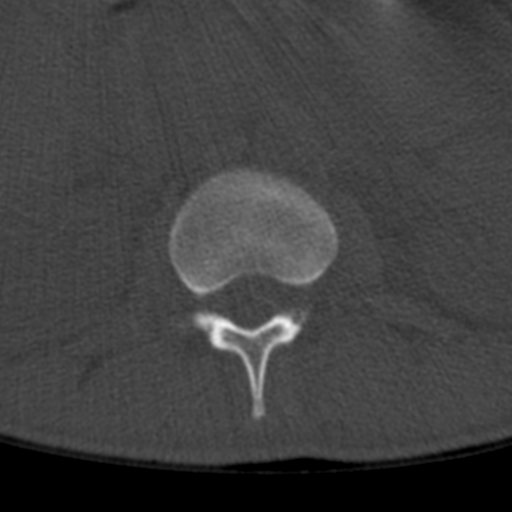
[im 97/105  bone]
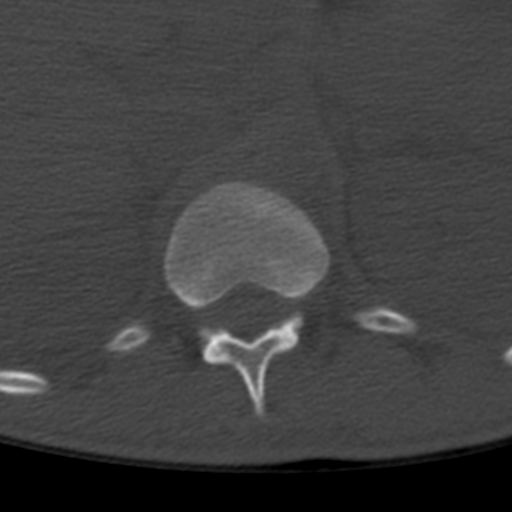

[Series 6: spine 2.0 coronal bone · coronal · 0.24mm/px · 1 of 44 slices shown]
[im 22/44  bone]
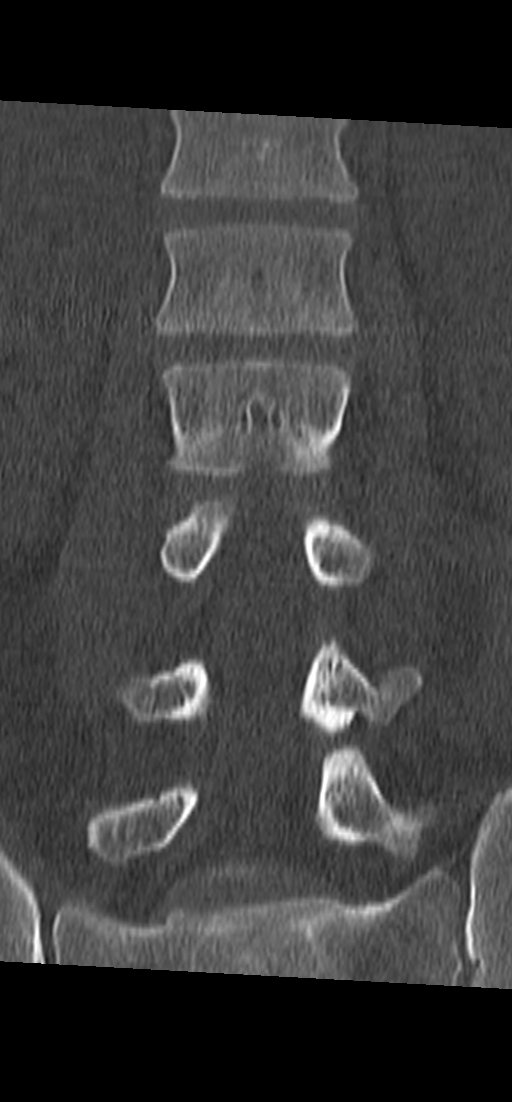

[Series 9: spine 2.0 sagittal st · sagittal · 0.33mm/px · 5 of 56 slices shown]
[im 10/56  bone]
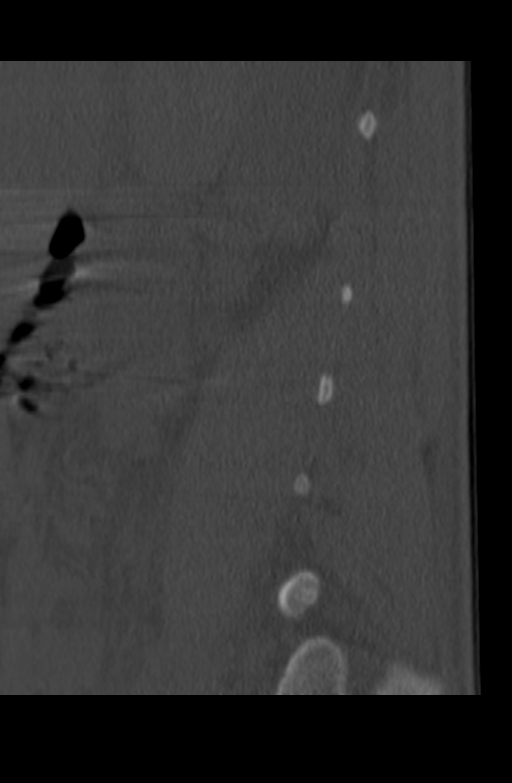
[im 19/56  bone]
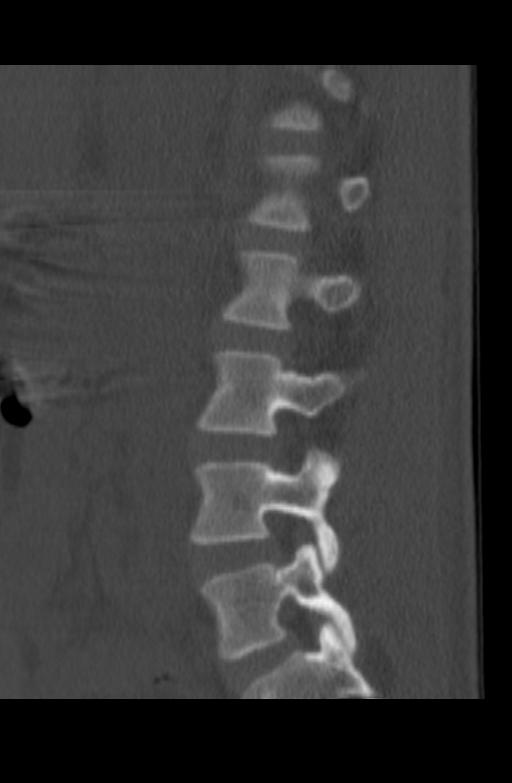
[im 28/56  bone]
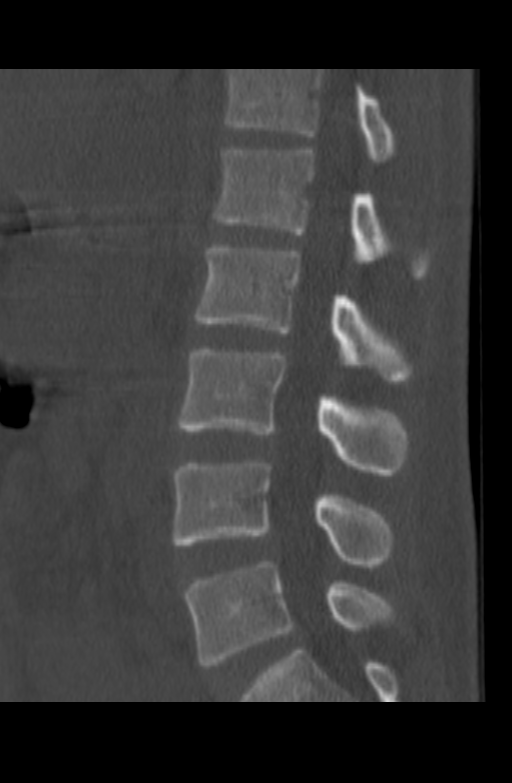
[im 37/56  bone]
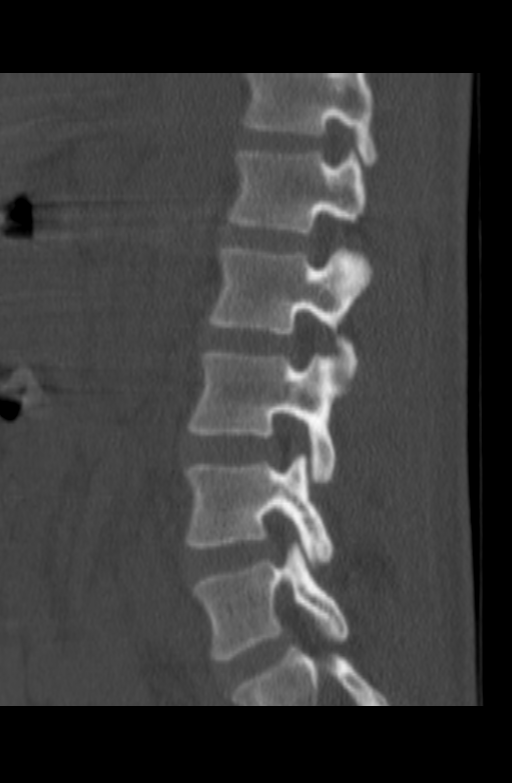
[im 46/56  bone]
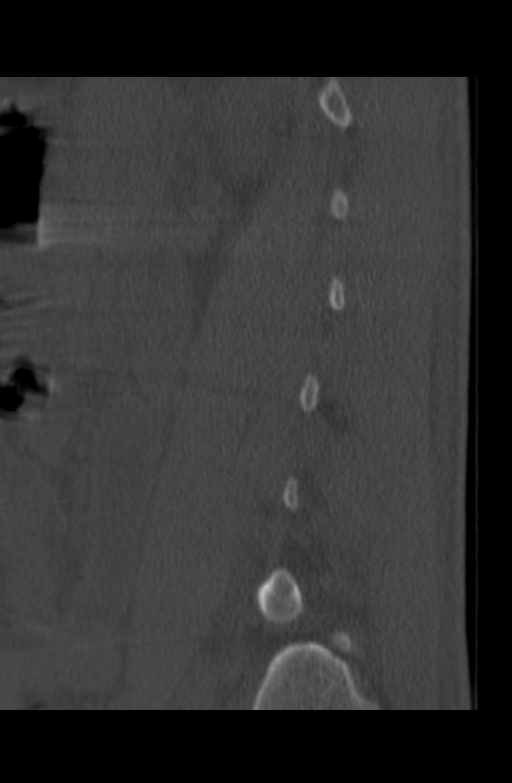

[14 of 33 positions shown; findings below may reference images not displayed]

FINDINGS: Negative for fracture or mass. Disc spaces are normal. No
degenerative change. Facet joints are normal.

No soft tissue swelling. The psoas muscle and retroperitoneum are
normal.

No evidence of spinal infection. MRI with contrast is more sensitive
than CT for detection of spinal infection.
IMPRESSION: Normal

## 2016-04-08 IMAGING — CT CT HEAD W/O CM
1 series · 16 of 30 positions shown, 20 images · non-contrast
Comparison: None.

CLINICAL DATA: Hyperglycemia.  Diabetes.

EXAM:
CT HEAD WITHOUT CONTRAST
TECHNIQUE: Contiguous axial images were obtained from the base of the skull
through the vertex without intravenous contrast.

[Series 2: head 4.8 h37s · axial · 0.47mm/px · z∈[-157,-20]mm · 16 of 32 slices shown, 20 images]
[im 2/32  brain]
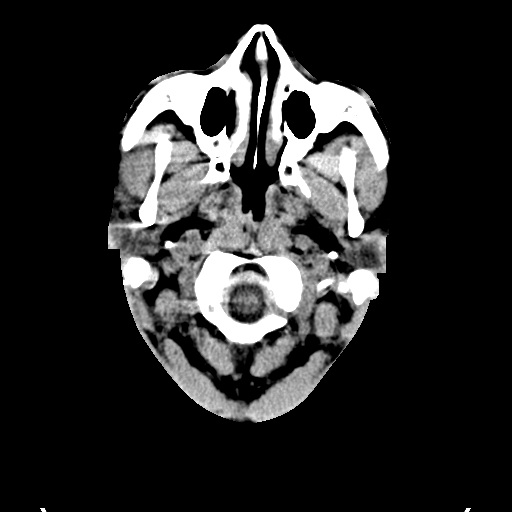
[im 2/32  bone]
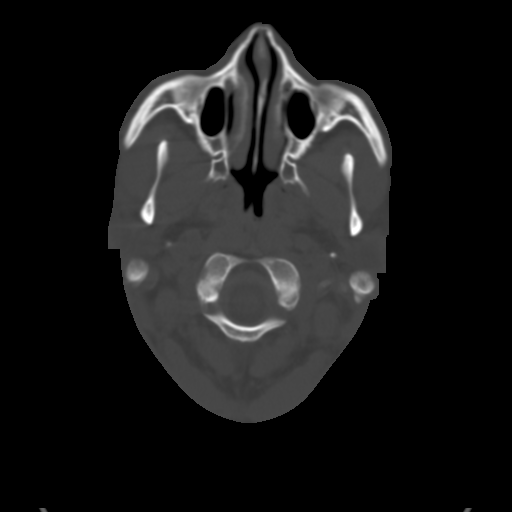
[im 4/32  brain]
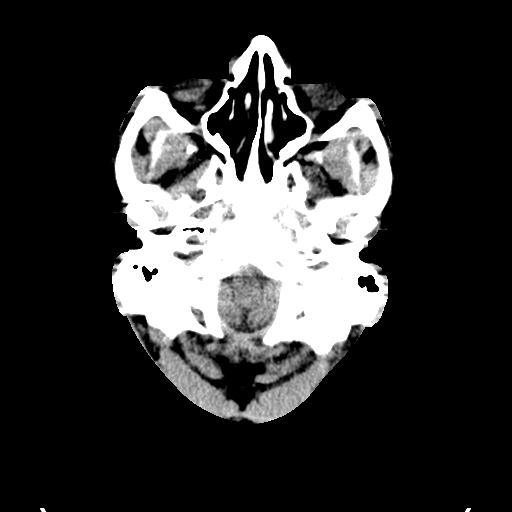
[im 6/32  brain]
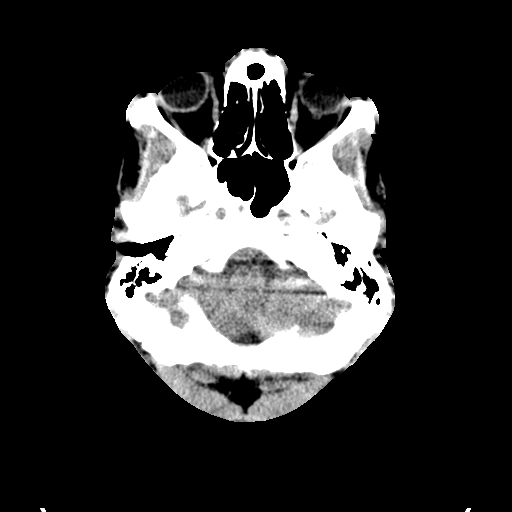
[im 8/32  brain]
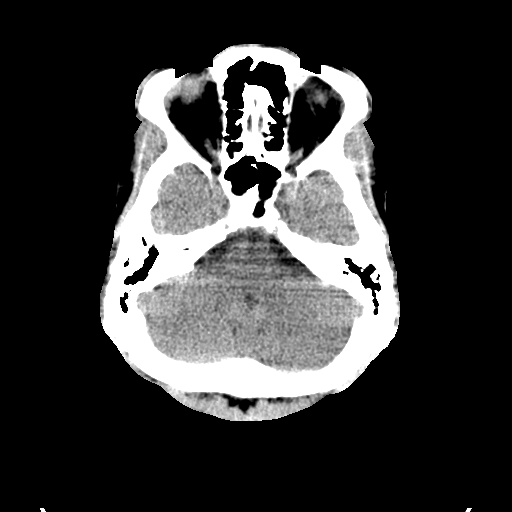
[im 9/32  brain]
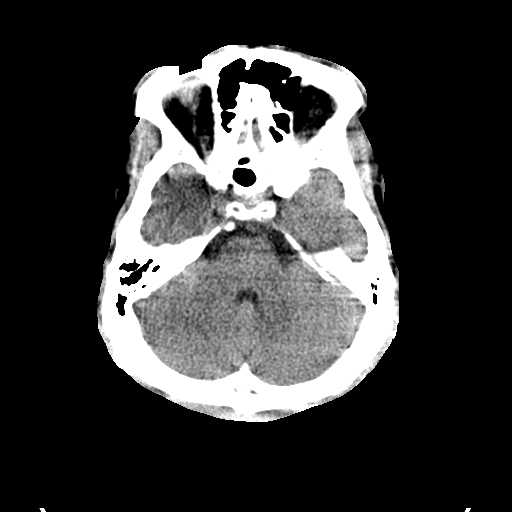
[im 9/32  bone]
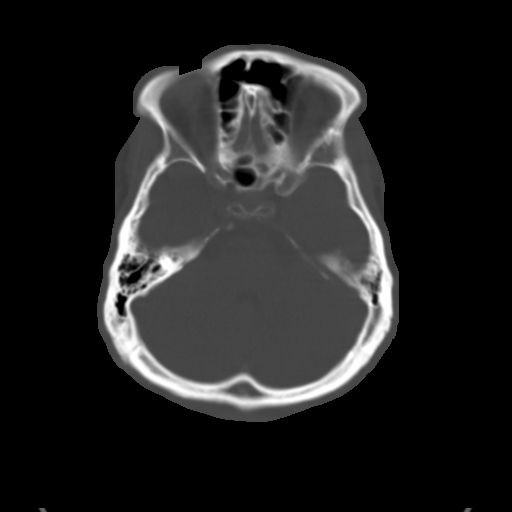
[im 11/32  brain]
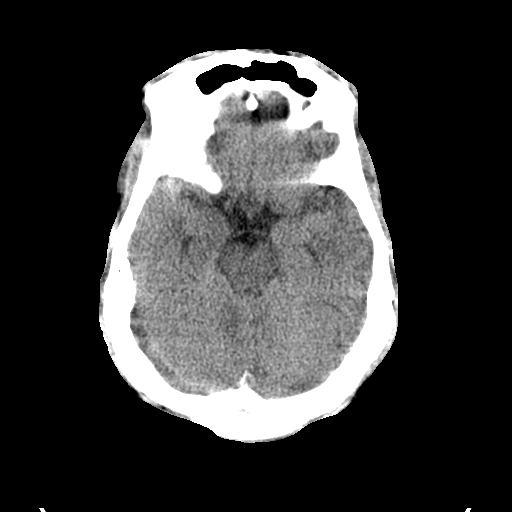
[im 13/32  brain]
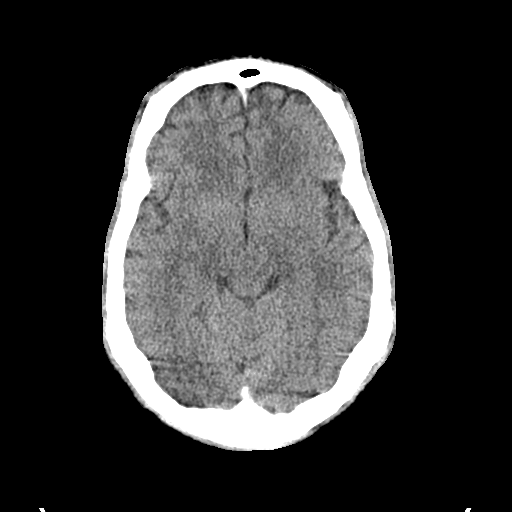
[im 15/32  brain]
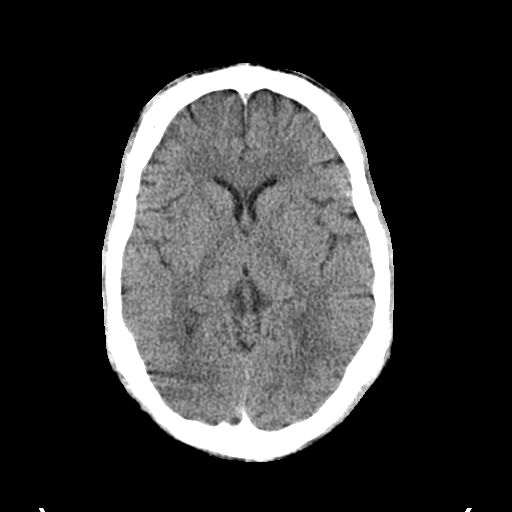
[im 17/32  brain]
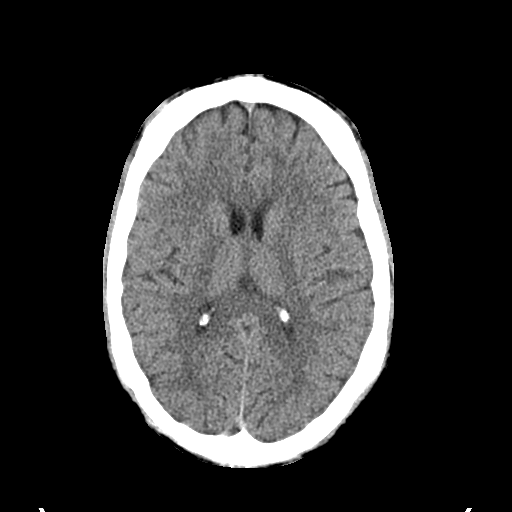
[im 17/32  bone]
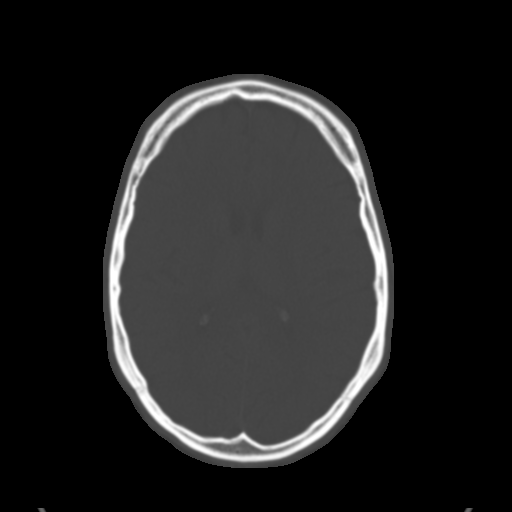
[im 19/32  brain]
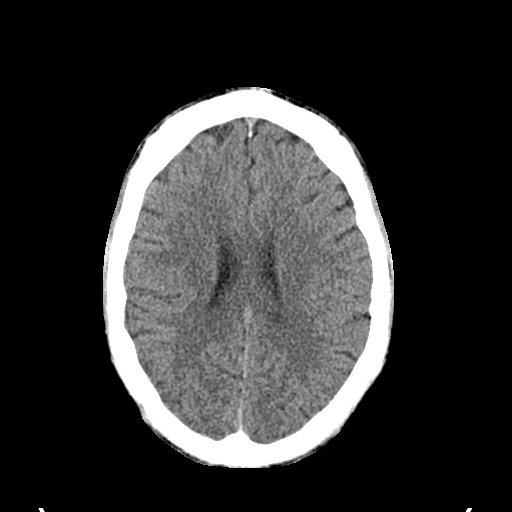
[im 21/32  brain]
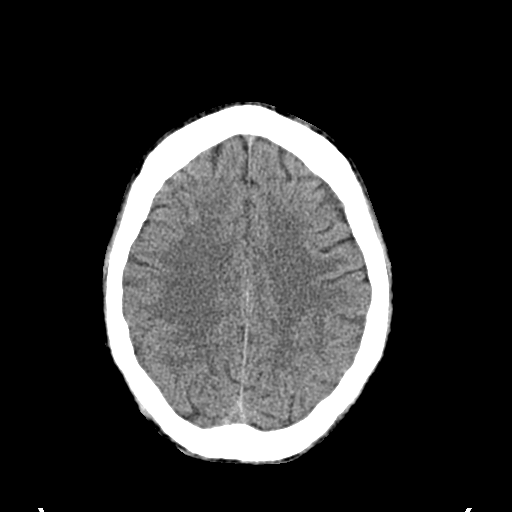
[im 23/32  brain]
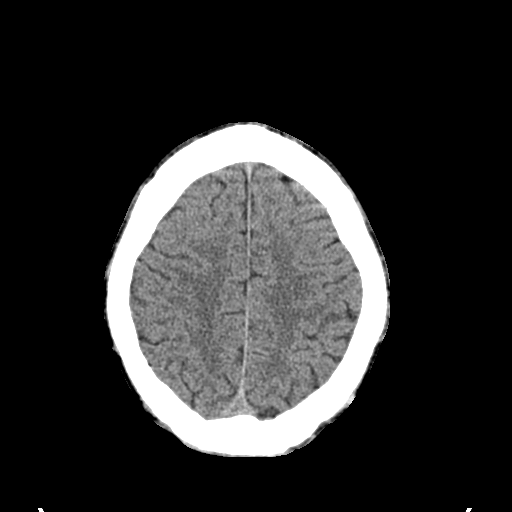
[im 24/32  brain]
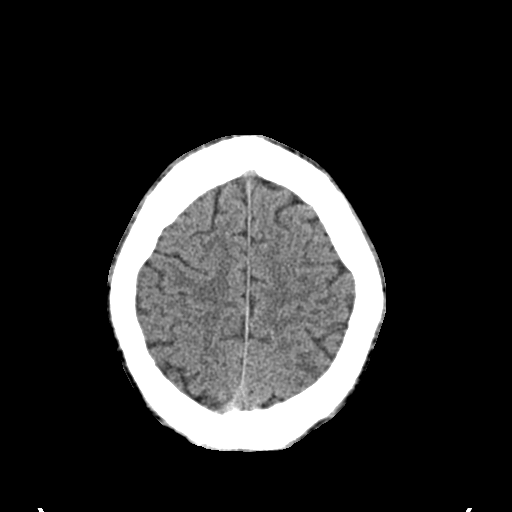
[im 24/32  bone]
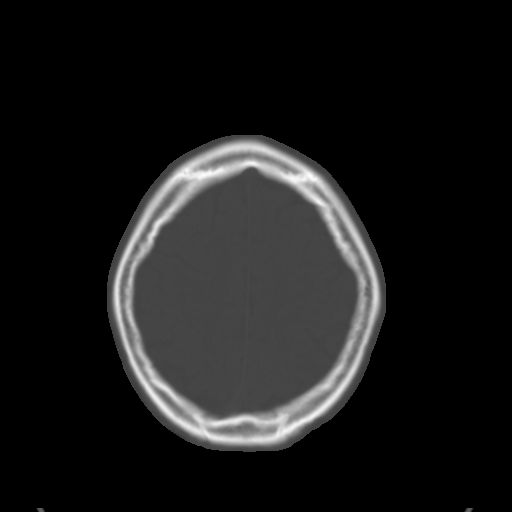
[im 26/32  brain]
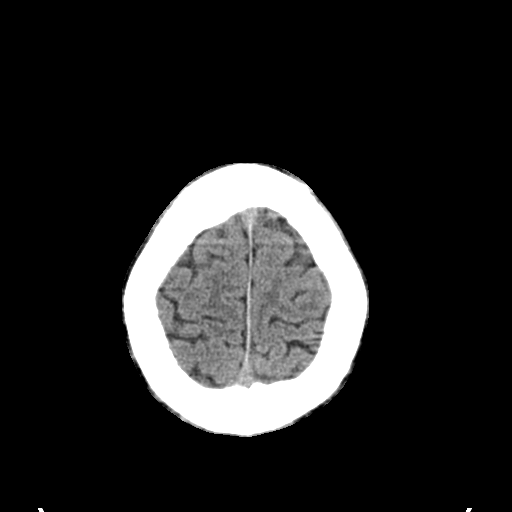
[im 28/32  brain]
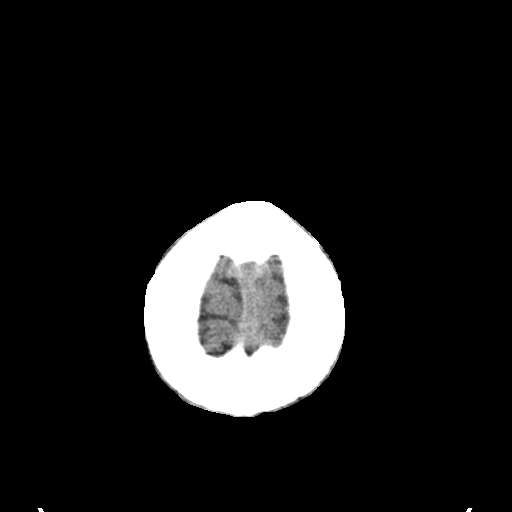
[im 30/32  brain]
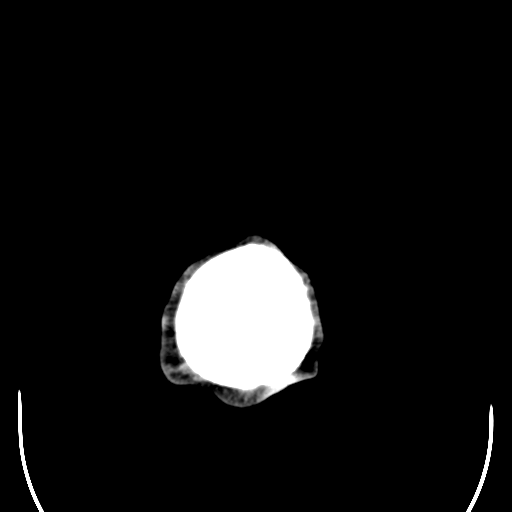

[16 of 30 positions shown; findings below may reference images not displayed]

FINDINGS: Image quality degraded by mild motion.

Ventricle size is normal. Small hypodensity left frontal white
matter, likely due to chronic ischemia. Negative for acute infarct,
hemorrhage, or mass lesion. Calvarium intact. Visualized sinuses are
clear.
IMPRESSION: Small hypodensity left frontal white matter, most consistent with
mild chronic microvascular ischemia. No acute abnormality.

## 2016-04-08 IMAGING — CR DG ABD PORTABLE 1V
1 series · 1 of 1 positions shown · non-contrast
Comparison: Portable exam 3773 hr compared to 09/28/2013

CLINICAL DATA: Abdominal pain, hyperglycemia, history diabetes,
gastroparesis

EXAM:
PORTABLE ABDOMEN - 1 VIEW

[AP]
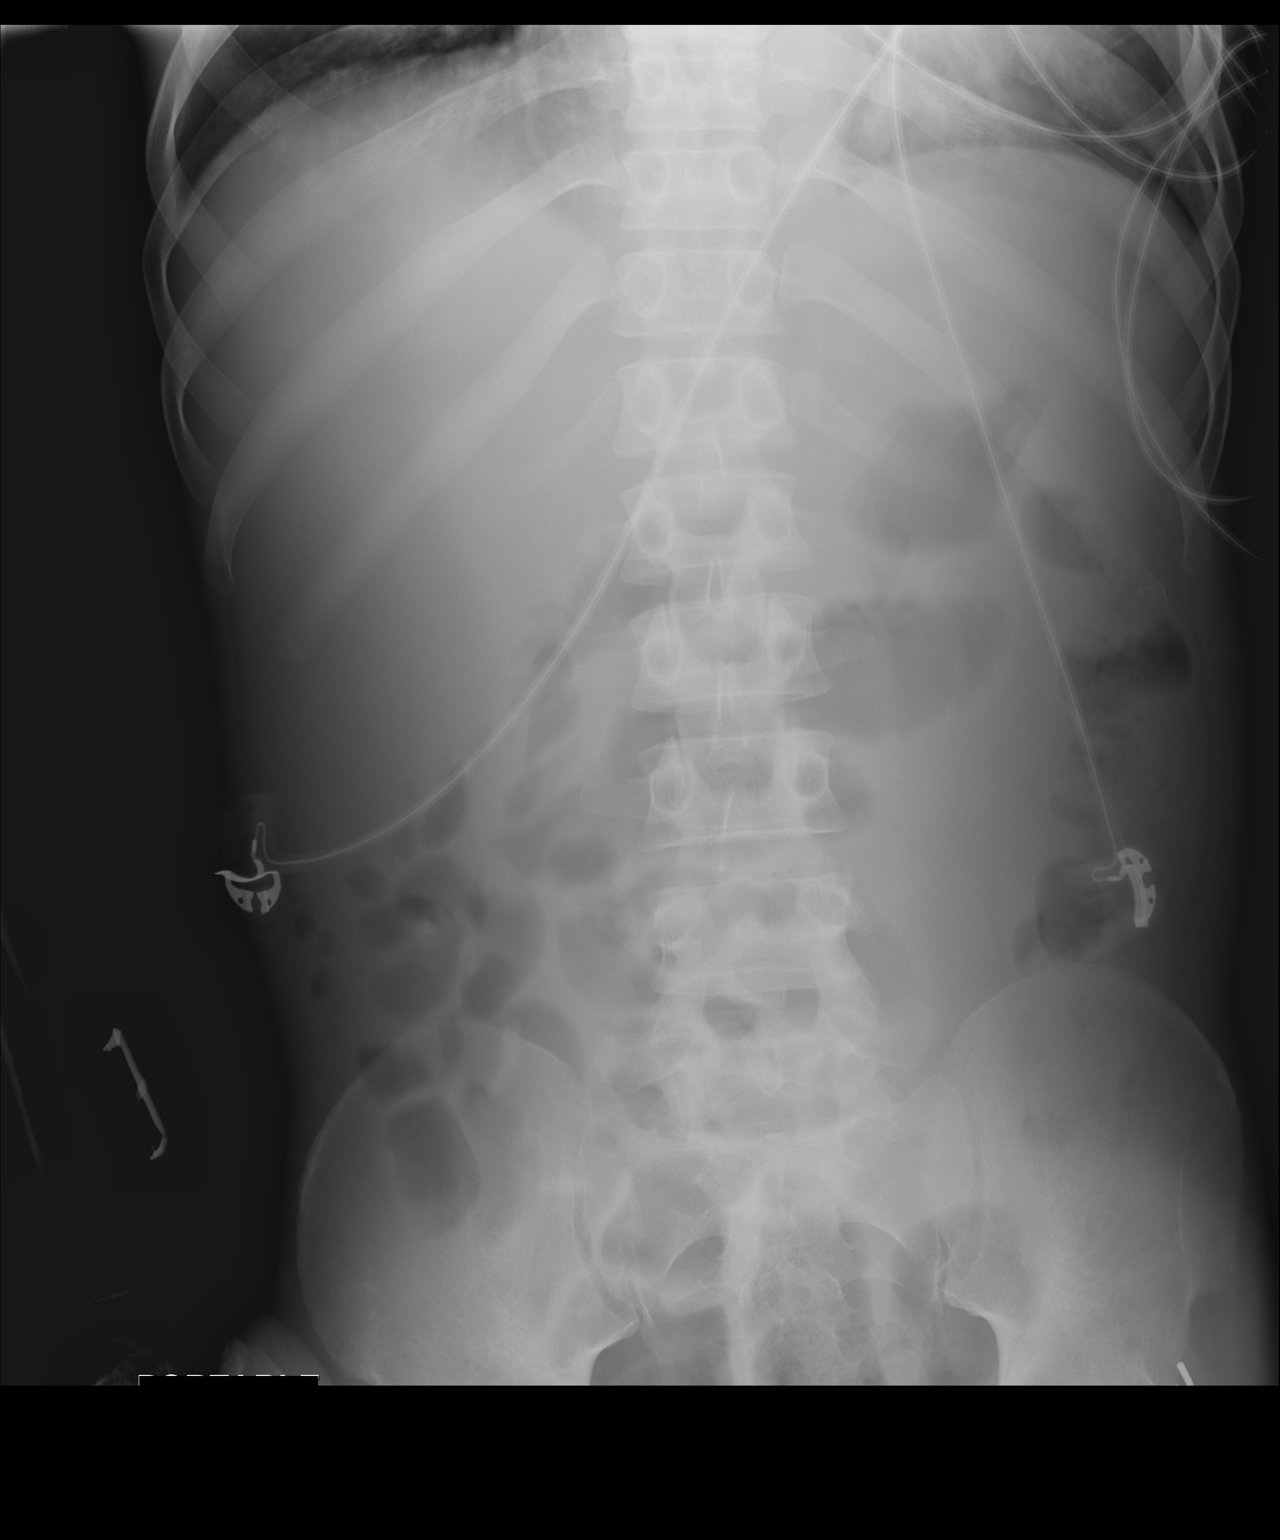

[1 of 1 positions shown; findings below may reference images not displayed]

FINDINGS: Slight motion artifact limits exam.

Dilated small bowel loop in the mid abdomen, nonspecific.

Remaining bowel loops normal.

No definite bowel wall thickening or urinary tract calcification.

Osseous structures unremarkable.

Multiple left pelvic phleboliths.
IMPRESSION: Single mildly distended small bowel loop in the left mid abdomen,
nonspecific.

No other abnormalities identified.

## 2016-04-08 IMAGING — CR DG CHEST 1V PORT
1 series · 1 of 1 positions shown · non-contrast
Comparison: 10/02/2013

CLINICAL DATA: Lethargy, fatigue

EXAM:
PORTABLE CHEST - 1 VIEW

[view not recorded]
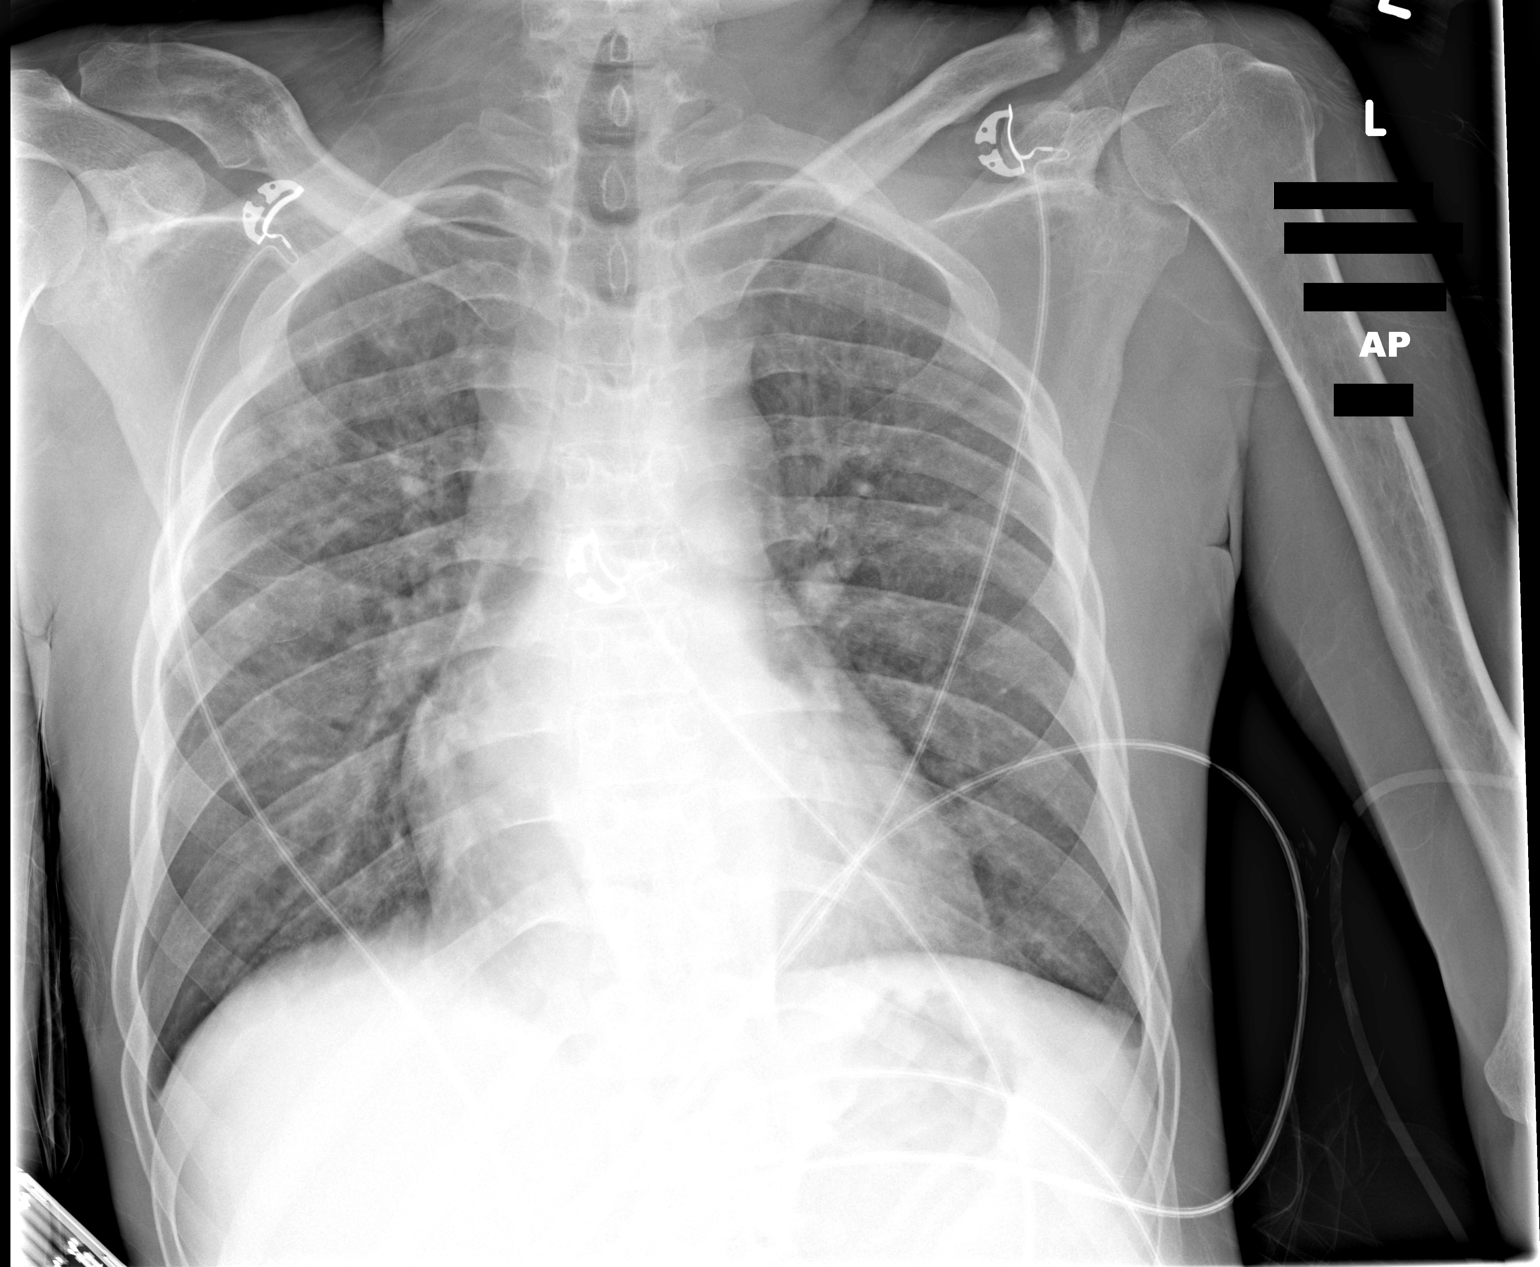

[1 of 1 positions shown; findings below may reference images not displayed]

FINDINGS: Cardiac shadow is stable. The lungs are well aerated bilaterally.
Mild patchy infiltrative density is noted in the right mid and upper
lung. No acute bony abnormality is noted. Chronic changes of the
distal left clavicle are again seen.
IMPRESSION: Patchy infiltrate in the right mid and upper lung. Followup films
are recommended.

## 2016-04-08 IMAGING — CR DG TOE GREAT 2+V*R*
2 series · 2 of 2 positions shown · non-contrast
Comparison: MRI 4 foot 01/29/2013 ; prior radiographs of the right
foot 01/28/2013

CLINICAL DATA: Hyperglycemia

EXAM:
RIGHT GREAT TOE

[view not recorded (1 of 2)]
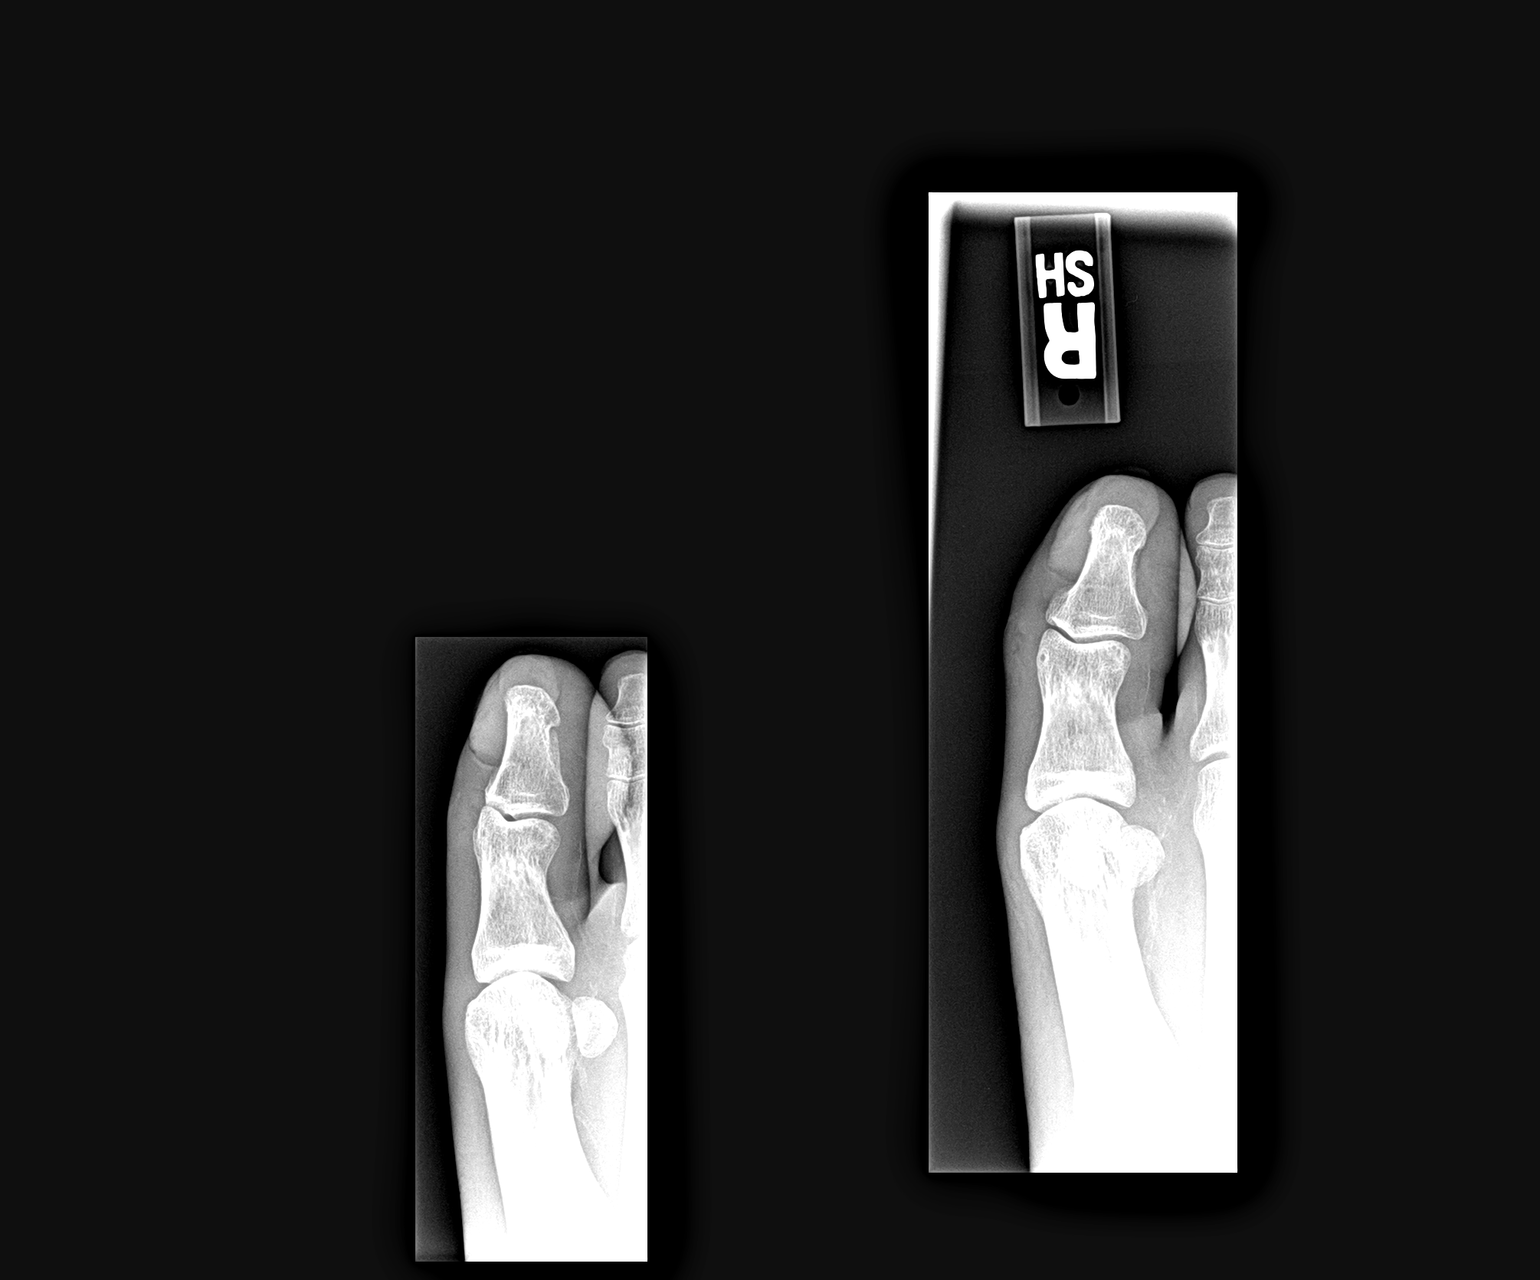

[view not recorded (2 of 2)]
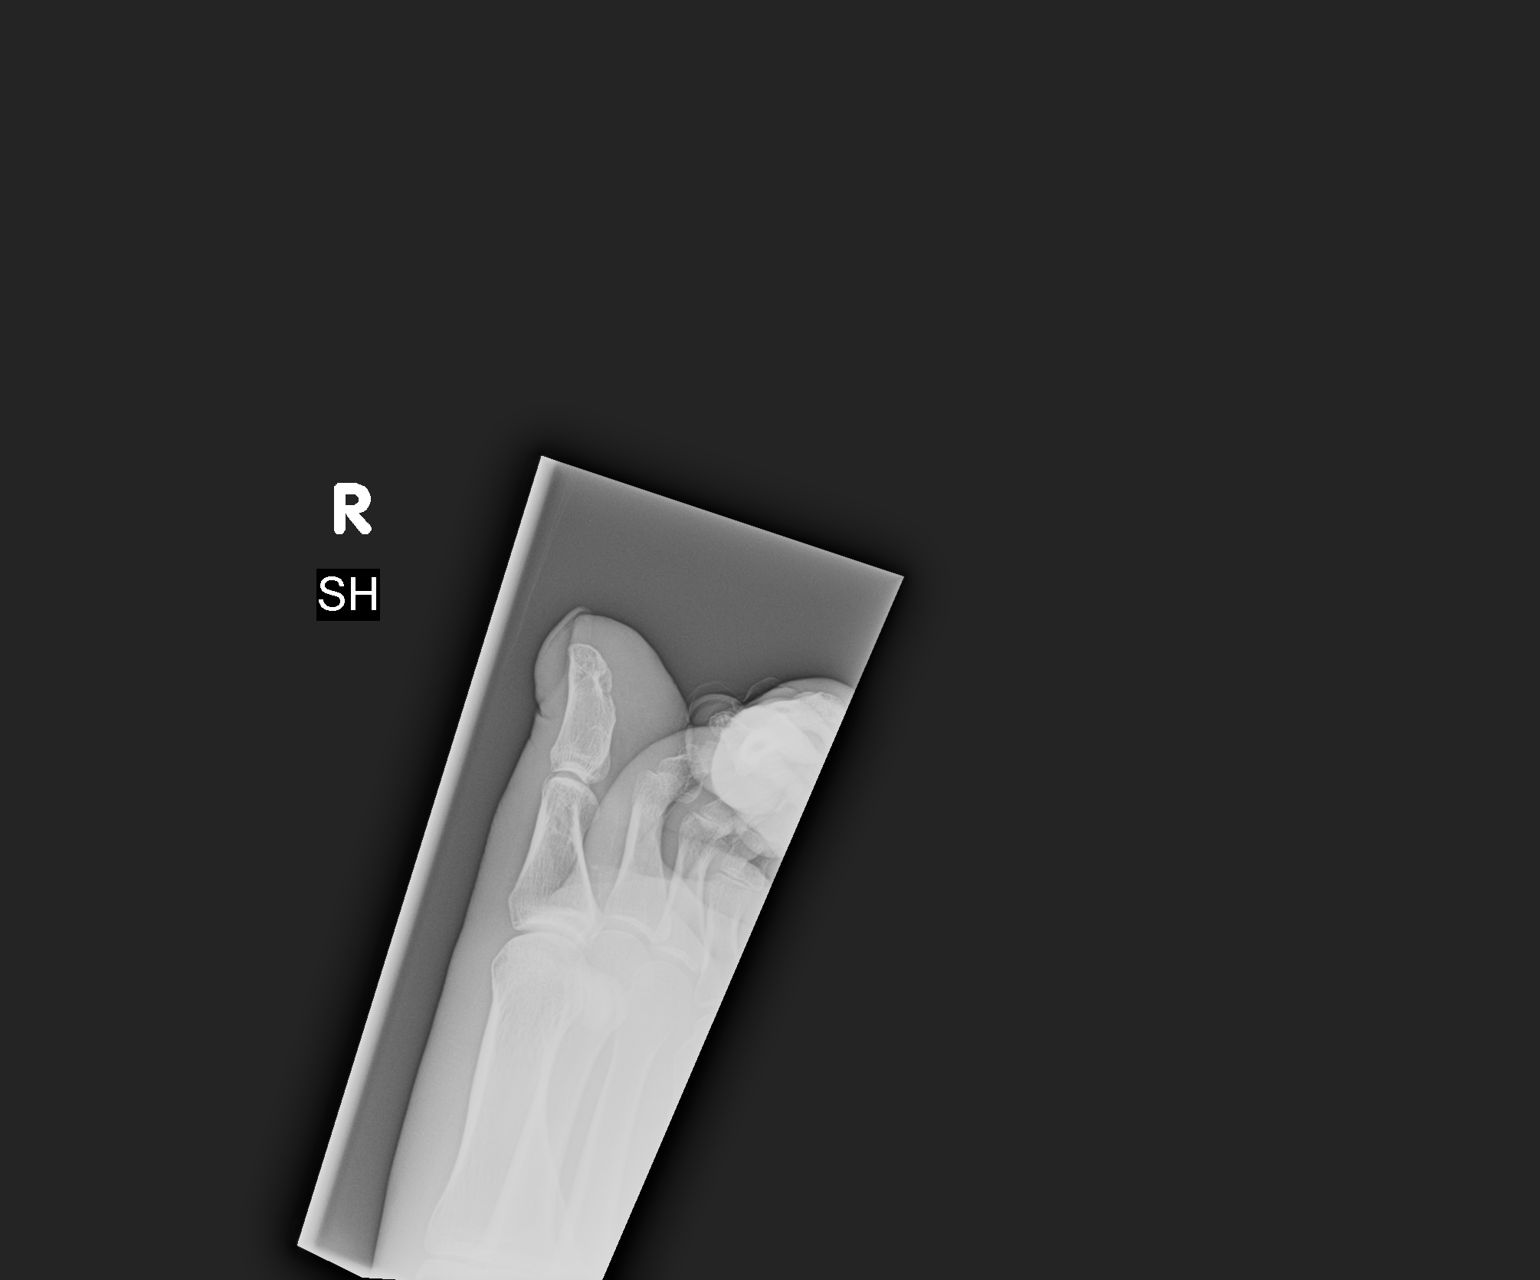

[2 of 2 positions shown; findings below may reference images not displayed]

FINDINGS: Tissue irregularity of the medial aspect of the great toe at the
interphalangeal joint concerning for cellulitis or ulceration. Soft
tissue swelling and bulging of the nail bed. No conventional
radiographic evidence of osteomyelitis. No acute fracture or
malalignment. Atherosclerotic calcifications noted in the visualized
small vessels.
IMPRESSION: 1. Diffuse soft tissue swelling about the toe concerning for
cellulitis.
2. No evidence of acute osseous abnormality.

## 2016-04-09 MED ORDER — HYDROMORPHONE HCL 8 MG PO TABS: 8.0000 mg | ORAL_TABLET | Freq: Four times a day (QID) | ORAL | Status: DC | PRN

## 2016-04-09 NOTE — Telephone Encounter (Signed)
done

## 2016-04-09 NOTE — Telephone Encounter (Signed)
I spoke with pharmacy and they are going to return my call.

## 2016-04-09 NOTE — Telephone Encounter (Signed)
Script is ready for pick up and I spoke with dad.

## 2016-04-09 NOTE — Telephone Encounter (Signed)
Pt does have the below medication, he needs a refill on Dilaudid.

## 2016-04-09 NOTE — Addendum Note (Signed)
Addended by: [name] A on: 04/09/2016 09:57 AM   Modules accepted: Orders

## 2016-04-14 IMAGING — MR MR THORACIC SPINE WO/W CM
5 of 13 series · 17 of 48 positions shown · IV contrast (multihance)
Comparison: Brain and cervical spine MRI without contrast
01/29/2014.

CLINICAL DATA: 27-year-old male with history of diabetes, gastric
paresis, diabetic neuropathy. Onset of numbness and weakness in the
lower extremities which progressed over less than 24 hr to the
flaccid paralysis. Hyperglycemia on presentation. Abnormal cervical
spine MRI with C4 to upper thoracic abnormal spinal cord signal.
Being empirically treated for transverse myelitis. Initial
encounter.

EXAM:
MRI CERVICAL SPINE WITH CONTRAST;
MRI THORACIC SPINE WITHOUT AND WITH CONTRAST
TECHNIQUE: Multiplanar and multiecho pulse sequences of the cervical spine, to
include the craniocervical junction and cervicothoracic junction,
were obtained according to standard protocol with intravenous
contrast.;
Multiplanar and multiecho pulse sequences of the thoracic spine were
obtained without and with intravenous contrast.
CONTRAST:  13mL MULTIHANCE GADOBENATE DIMEGLUMINE 529 MG/ML IV SOLN,
14mL MULTIHANCE GADOBENATE DIMEGLUMINE 529 MG/ML IV SOLN

[Series 2: T1 · axial · non-contrast · 3.0mm · 0.39mm/px · z∈[-60,+36]mm · 5 of 26 slices shown (1 of 3)]
[im 1/26]
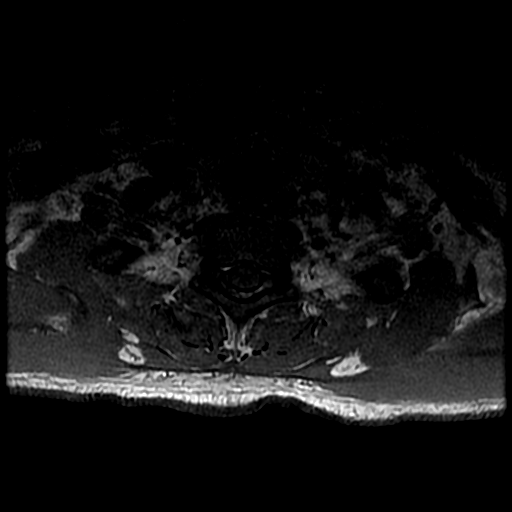
[im 7/26]
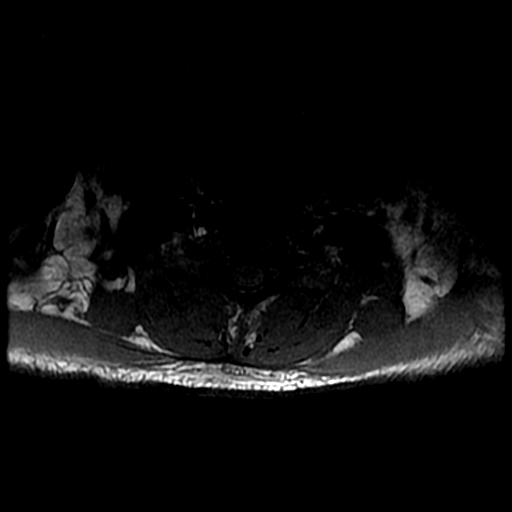
[im 13/26]
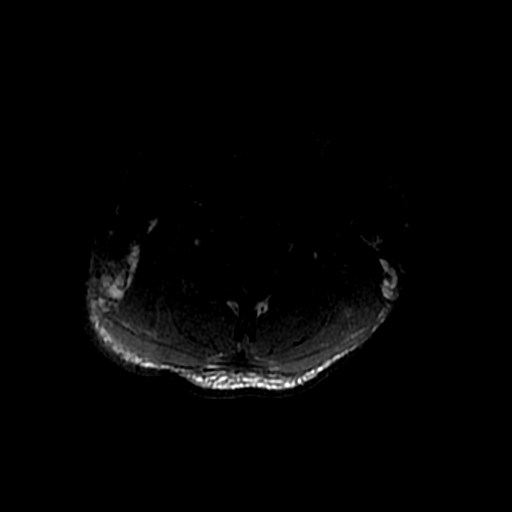
[im 19/26]
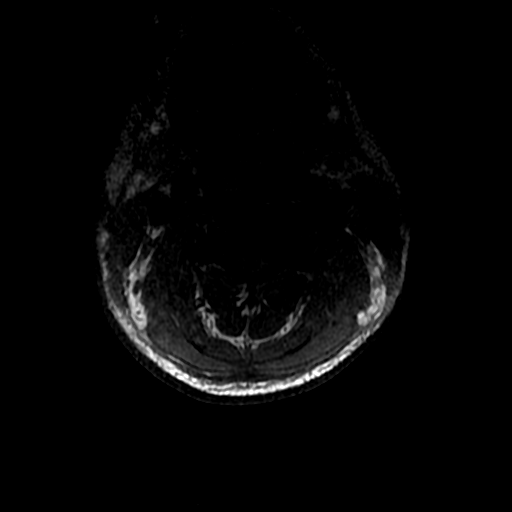
[im 26/26]
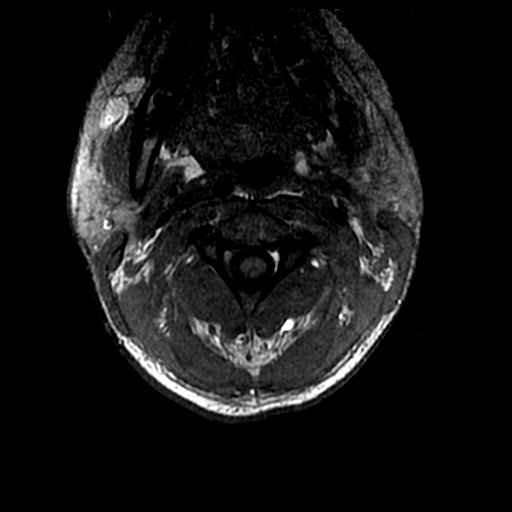

[Series 6: T2 · sagittal · 3.0mm · 0.62mm/px · 2 of 12 slices shown (1 of 2)]
[im 1/12]
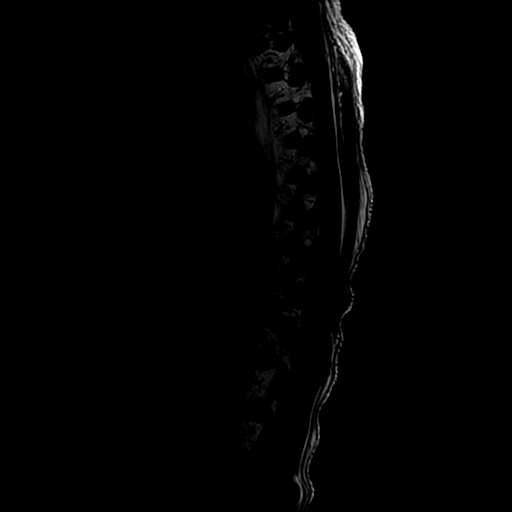
[im 12/12]
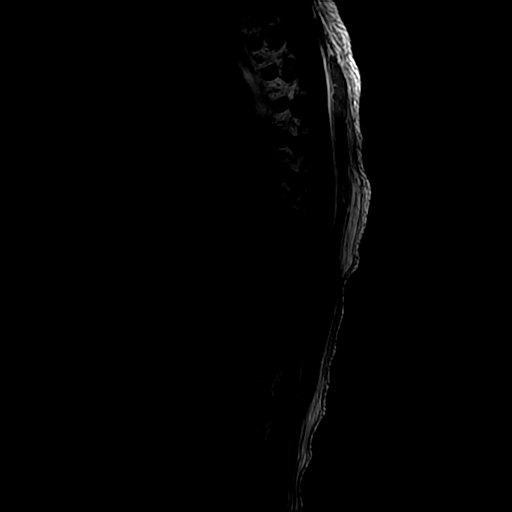

[Series 7: T1 · sagittal · 3.0mm · 0.62mm/px · 2 of 12 slices shown (2 of 3)]
[im 1/12]
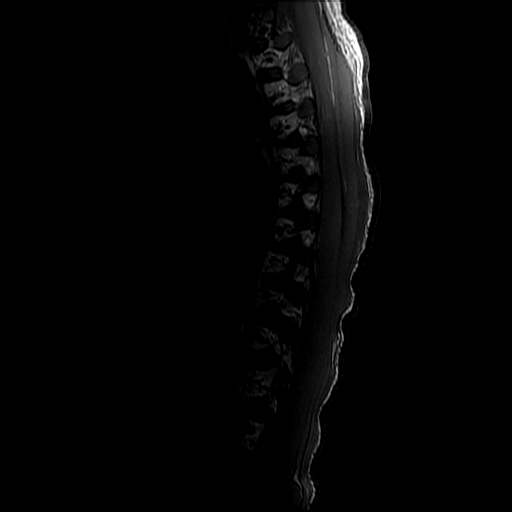
[im 12/12]
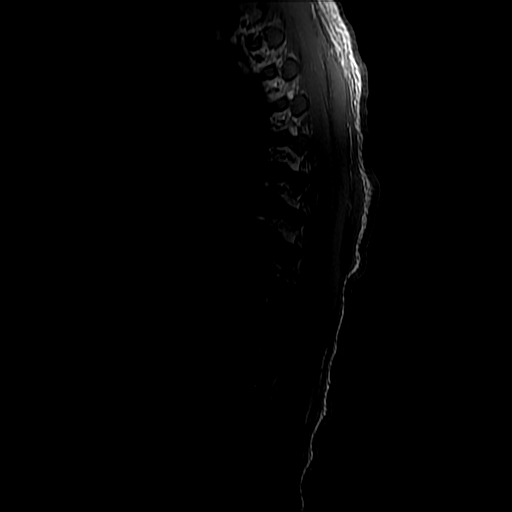

[Series 9: T2 · axial · 6.0mm · 0.43mm/px · z∈[-328,-42]mm · 6 of 37 slices shown (2 of 2)]
[im 1/37]
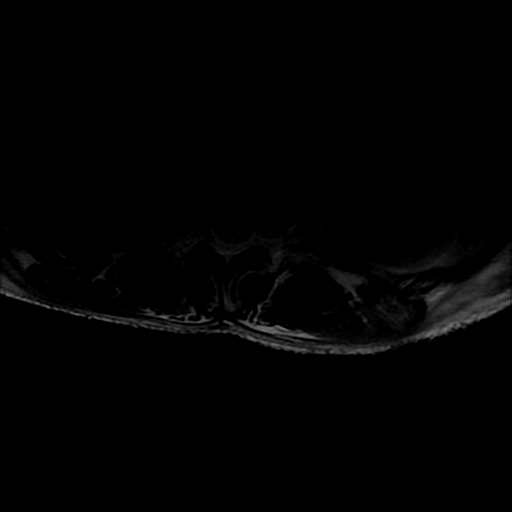
[im 8/37]
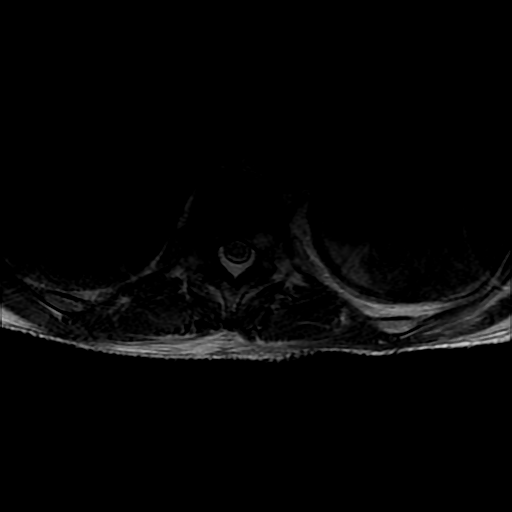
[im 15/37]
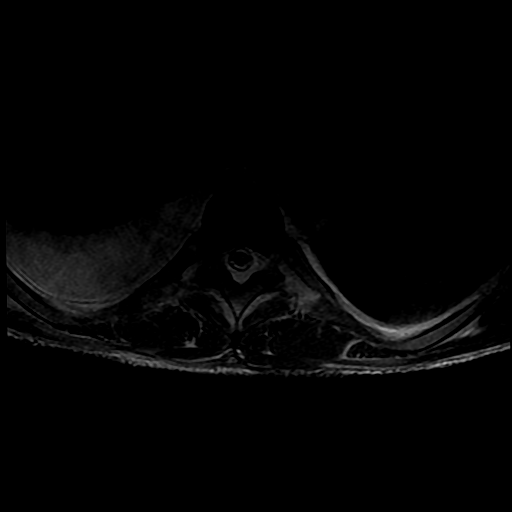
[im 22/37]
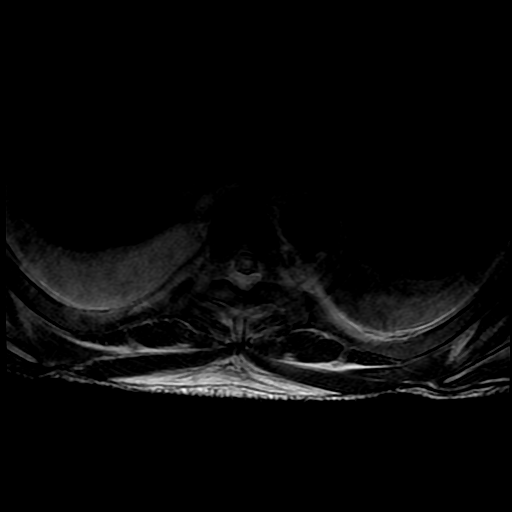
[im 29/37]
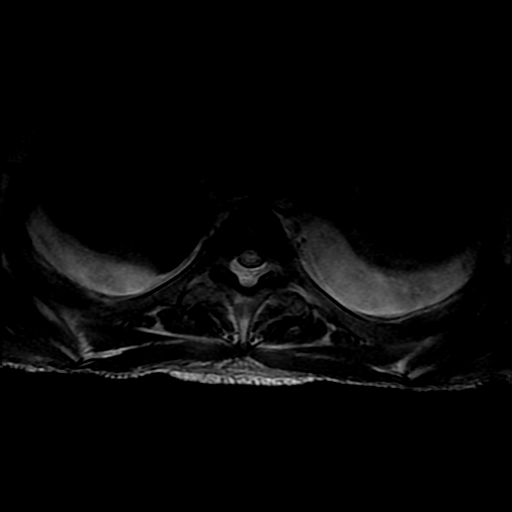
[im 37/37]
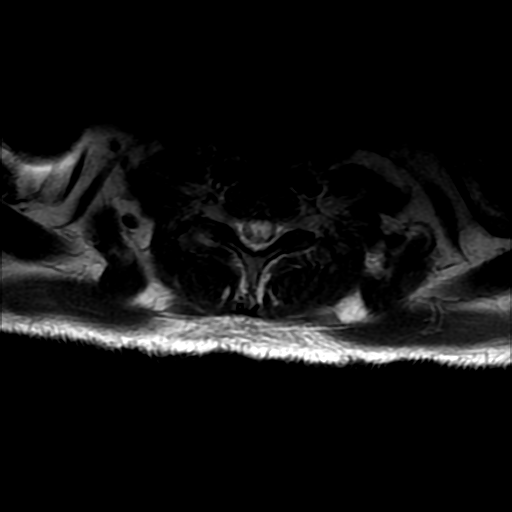

[Series 11: T1 · axial · non-contrast · 6.0mm · 0.43mm/px · z∈[-328,-272]mm · 2 of 37 slices shown (3 of 3)]
[im 1/37]
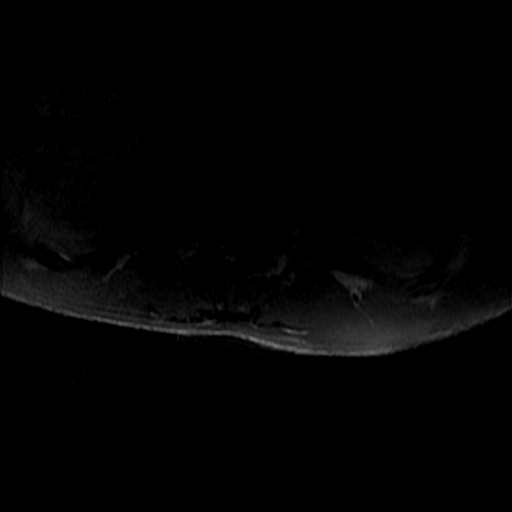
[im 8/37]
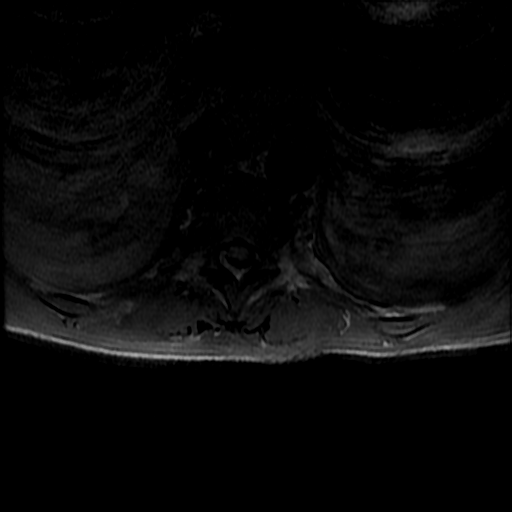

[17 of 48 positions shown; findings below may reference images not displayed]

FINDINGS: CERVICAL SPINE FINDINGS:

Axial in sagittal T1 weighted imaging of the cervical spine provided
today. No cervical or cervicomedullary dural thickening or
enhancement identified.

There is prominence of paravertebral venous plexus suspected about
the cervical spinal cord. This is superimposed on increased dorsal
epidural soft tissue which begins at C7. This dorsal signal seems
most likely to be related to epidural fat, but there is T1
heterogeneity of the soft tissue at the cervicothoracic junction.

There is patchy/petechial abnormal hyper enhancement of the lower
cervical spinal cord at the C6 level (series 16, image 5) extending
up to the inferior C5 and lower C7 level. No other abnormal
intra-axial enhancement.

THORACIC SPINE FINDINGS:

Dorsal epidural lipomatosis, most pronounced at the T4-T5 level. As
noted above, a at the cervicothoracic junction this soft tissue
which otherwise appears to be dorsal epidural fat has low T1 signal
heterogeneity in it. There also seems to be some enhancement (series
12, image 7).

The abnormal cervical spinal cord expansion and T2 hyperintensity
which previously extended only to the T1-T2 disc space now tracks
caudally as far as T8, with intense increased T2 signal within the
cord to the T5-T6 level. In these newly affected thoracic cord
regions, the central gray matter is disproportionately affected
(series 9, image 11, image 15). There is little CSF within the
thecal sac at these levels probably due to combined cord expansion
and epidural lipomatosis. No abnormal thoracic spinal cord
enhancement identified.

No dural thickening or enhancement.

The lower thoracic cord has a more normal appearance from T9 to L1.
The conus is incompletely visualized.

Normal thoracic vertebral height and alignment. No marrow edema or
evidence of acute osseous abnormality.

Large layering bilateral pleural effusions with confluent increased
post-contrast signal in the lungs. Visualized upper abdominal
viscera are grossly negative.
IMPRESSION: 1. Progressive abnormal spinal cord signal, previously involving the
lower cervical cord, but now extending throughout the upper and mid
thoracic cord as far as T8.
In the more recently affected thoracic levels, the central gray
matter is disproportionately affected. This raises the possibility
of spinal cord infarct. The main differential remains inflammatory
myelitis.
2. There is mild patchy enhancement about the C6 cord level, which
can be seen with both subacute infarct and transverse myelitis.
3. Epidural lipomatosis most pronounced in the thoracic spine. This
dorsal epidural fat is mildly heterogeneous at the cervicothoracic
junction, felt related to mild edema within the fat.
4. No dura thickening or enhancement. No findings suggestive of
pyogenic spinal infection.
Study discussed by telephone with Dr. Dumitru Dhiman at 4088 hr On
02/05/2014 .

## 2016-04-15 IMAGING — CR DG ABDOMEN 1V
1 series · 1 of 1 positions shown · non-contrast
Comparison: 01/29/2014

CLINICAL DATA: Constipation

EXAM:
ABDOMEN - 1 VIEW

[x abdomen supine]
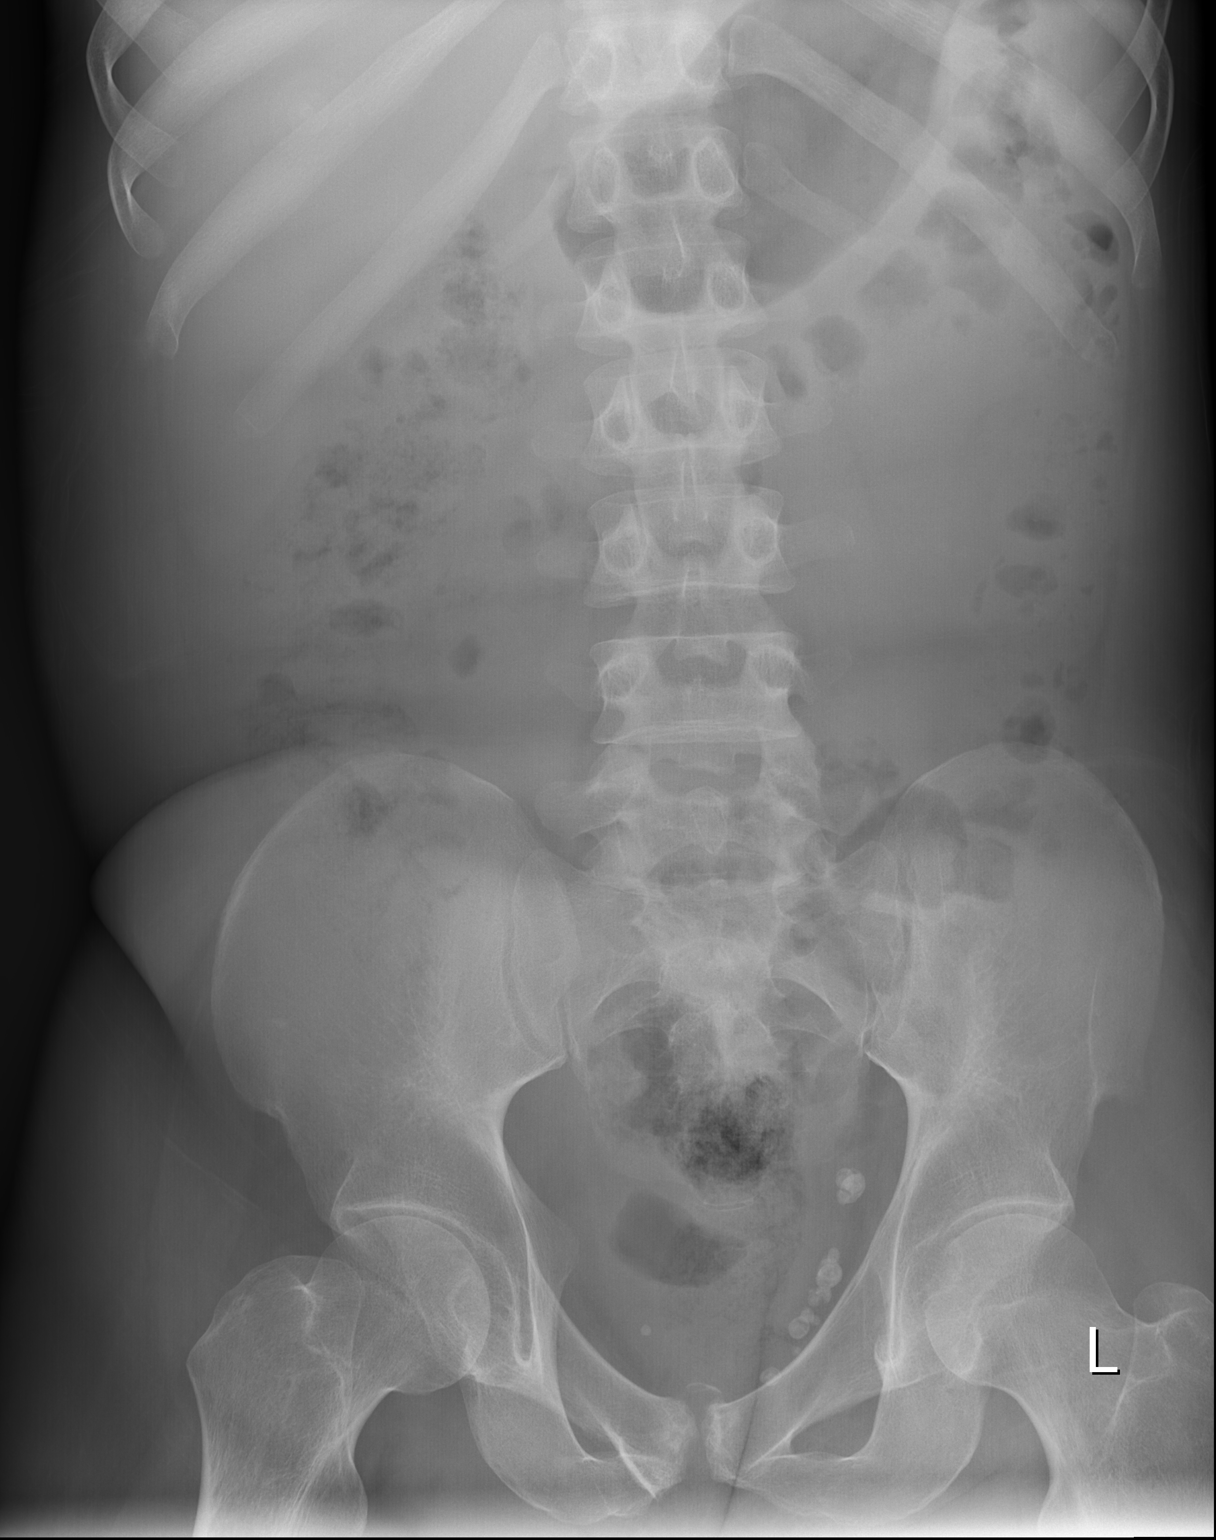

[1 of 1 positions shown; findings below may reference images not displayed]

FINDINGS: Nonobstructive bowel gas pattern.

Normal colonic stool burden.

Visualized osseous structures are within normal limits.
IMPRESSION: Unremarkable abdominal radiograph.

## 2016-04-15 IMAGING — CR DG CHEST 2V
2 series · 2 of 2 positions shown · non-contrast
Comparison: 01/29/2014

CLINICAL DATA: Follow-up pneumonia

EXAM:
CHEST  2 VIEW

[w chest lat]
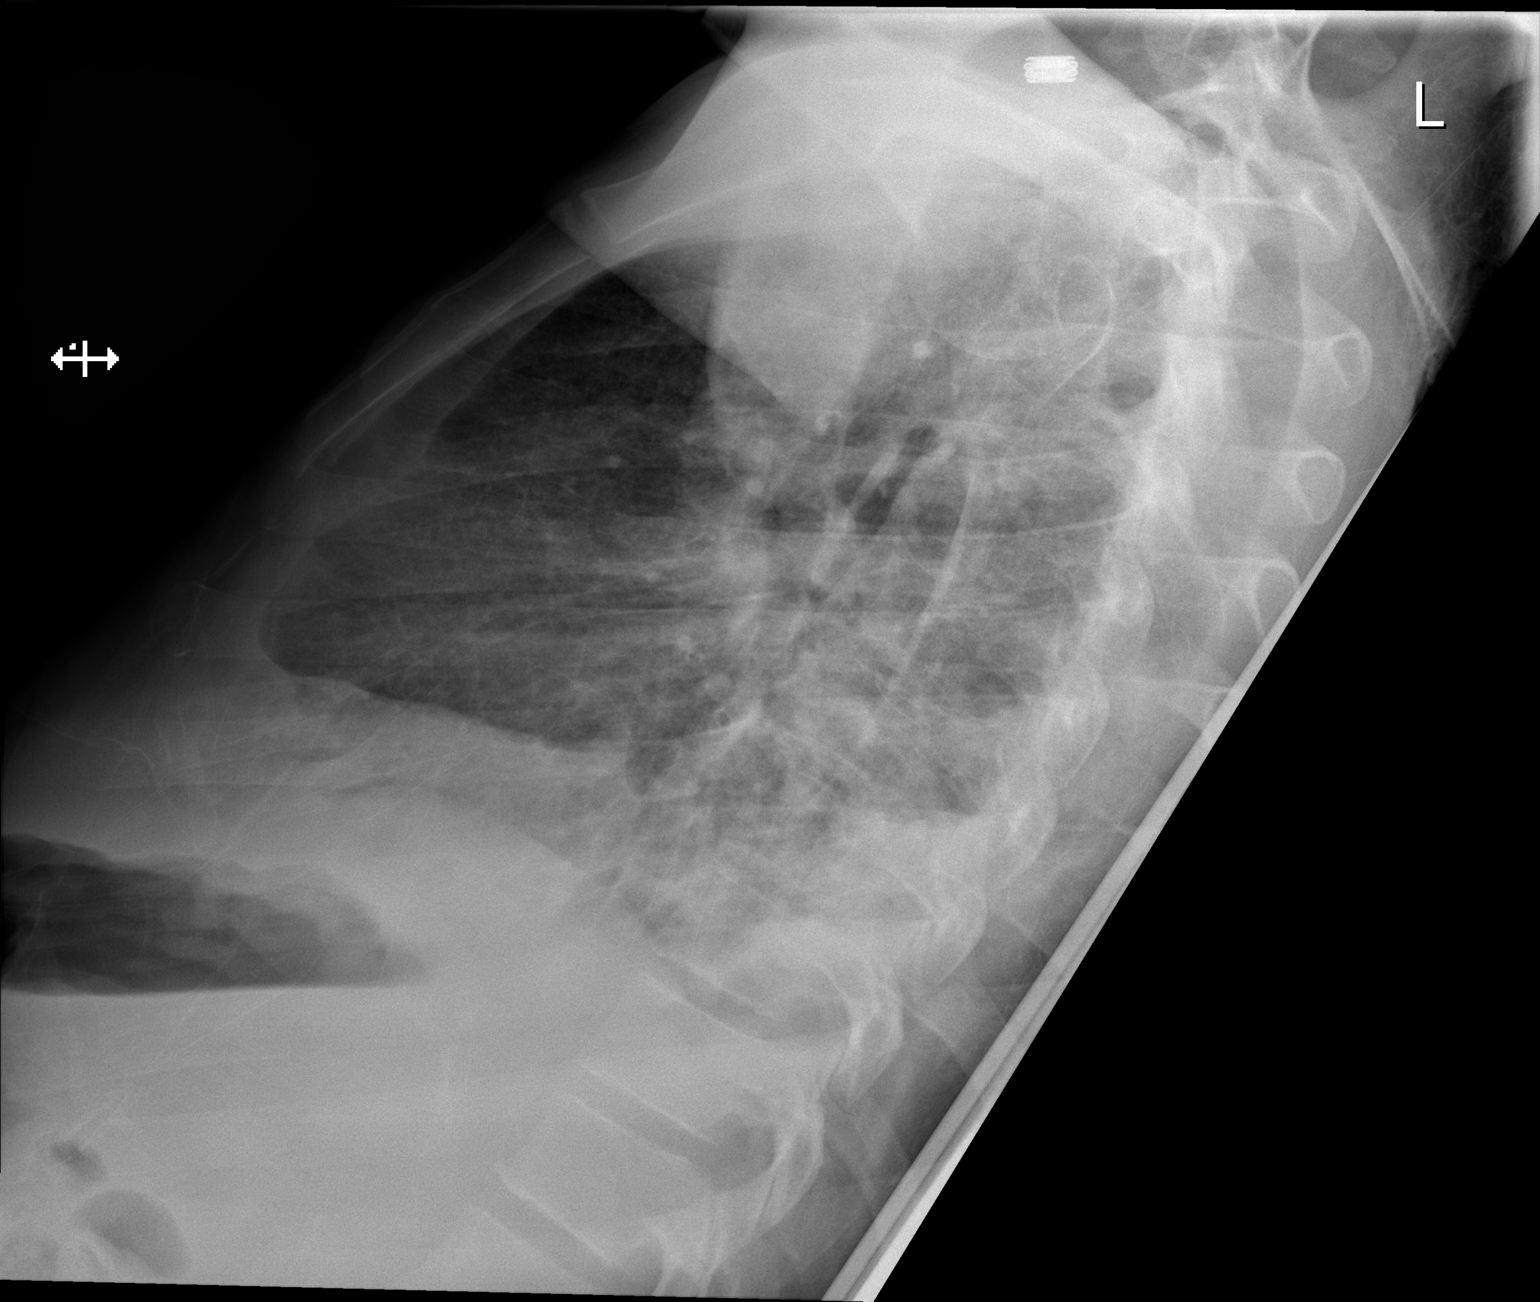

[x chest ap]
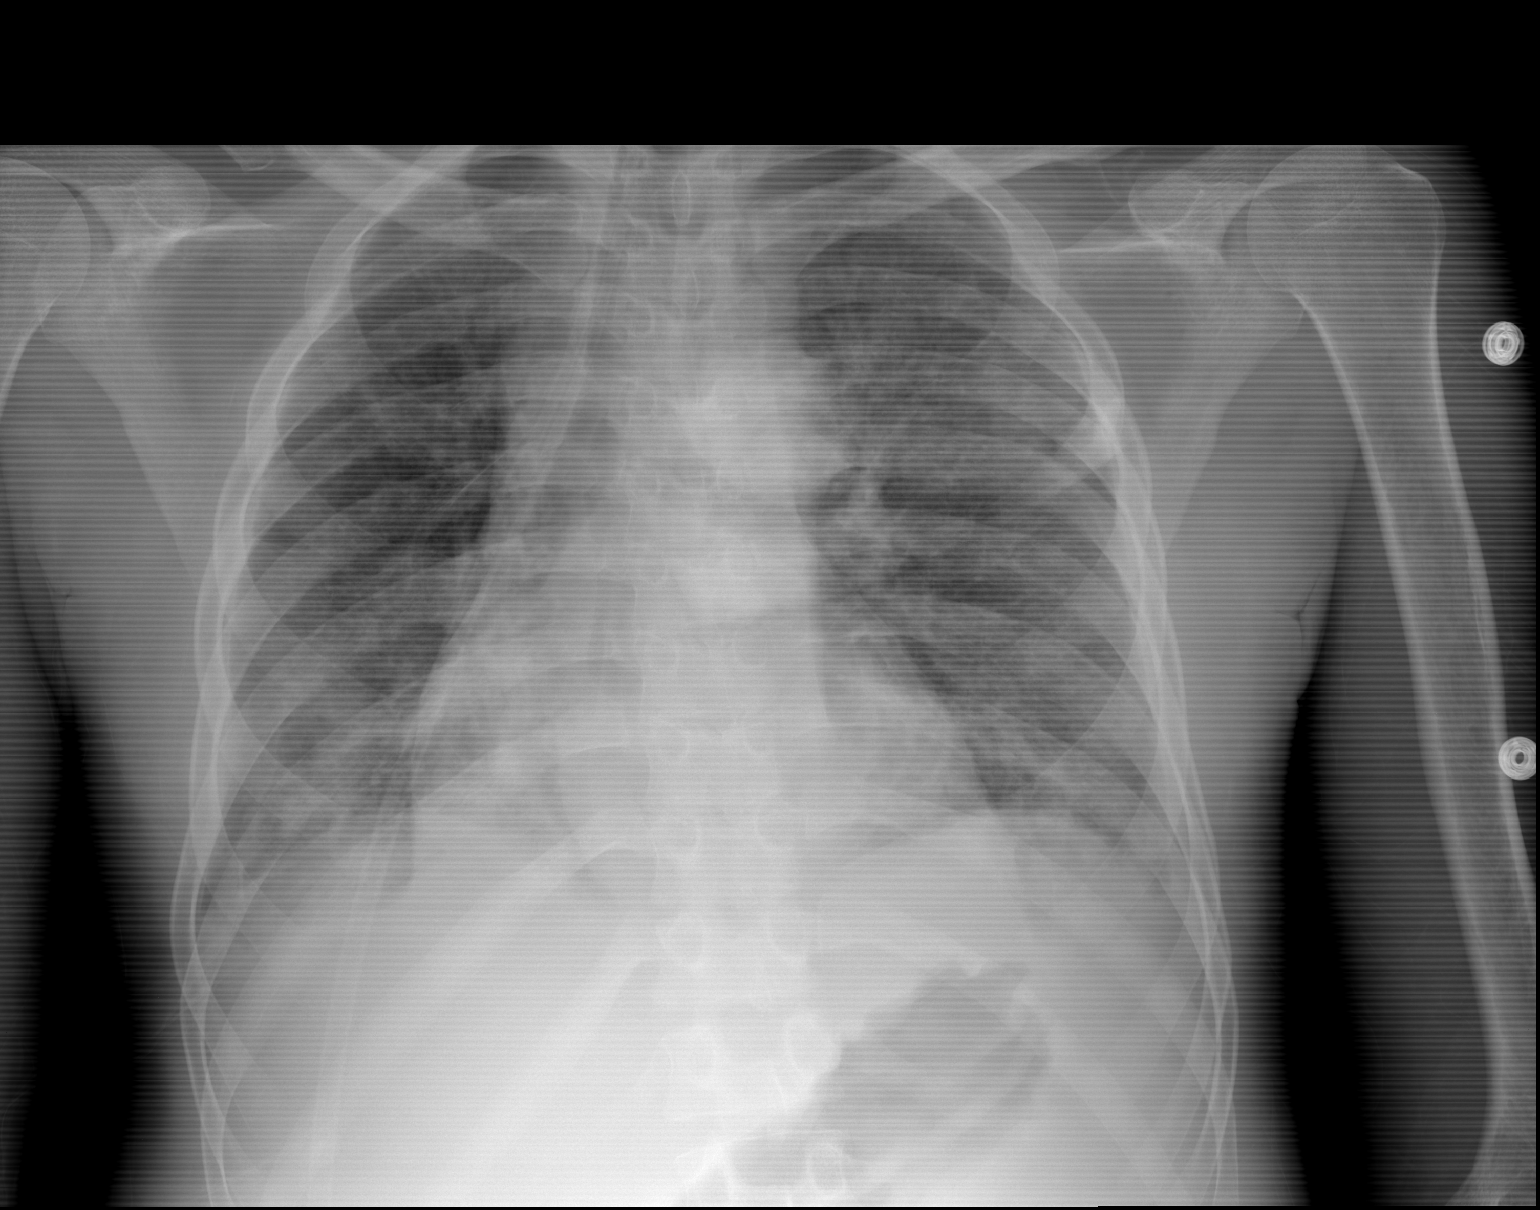

[2 of 2 positions shown; findings below may reference images not displayed]

FINDINGS: Cardiomegaly with possible mild interstitial edema.

Patchy right lower lobe opacity, likely atelectasis, pneumonia not
excluded. Suspected small right pleural effusion.

Visualized osseous structures are within normal limits.
IMPRESSION: Cardiomegaly with possible mild interstitial edema.

Patchy right lower lobe opacity, likely atelectasis, pneumonia not
excluded.

Small right pleural effusion.

## 2016-04-16 ENCOUNTER — Other Ambulatory Visit: Payer: Self-pay | Admitting: Family Medicine

## 2016-04-16 NOTE — Telephone Encounter (Signed)
Refill sent to pharmacy.   

## 2016-04-17 ENCOUNTER — Telehealth: Payer: Self-pay | Admitting: Family Medicine

## 2016-04-17 NOTE — Telephone Encounter (Signed)
RN is requesting a order for a wound vac for pt

## 2016-04-17 NOTE — Telephone Encounter (Signed)
Spoke to Jason [name] and informed him that Dr. Sarajane Jews is out of the office and will not return until 04/23/16

## 2016-04-19 ENCOUNTER — Encounter (HOSPITAL_BASED_OUTPATIENT_CLINIC_OR_DEPARTMENT_OTHER): Payer: Medicaid Other | Attending: Internal Medicine

## 2016-04-19 ENCOUNTER — Ambulatory Visit (HOSPITAL_COMMUNITY)
Admission: RE | Admit: 2016-04-19 | Discharge: 2016-04-19 | Disposition: A | Discharge status: Home / Self Care | Payer: Medicaid Other | Source: Ambulatory Visit | Attending: Internal Medicine | Admitting: Internal Medicine

## 2016-04-19 ENCOUNTER — Other Ambulatory Visit: Payer: Self-pay | Admitting: Internal Medicine

## 2016-04-19 DIAGNOSIS — Z8614 Personal history of Methicillin resistant Staphylococcus aureus infection: Secondary | ICD-10-CM | POA: Diagnosis not present

## 2016-04-19 DIAGNOSIS — G40909 Epilepsy, unspecified, not intractable, without status epilepticus: Secondary | ICD-10-CM | POA: Insufficient documentation

## 2016-04-19 DIAGNOSIS — R918 Other nonspecific abnormal finding of lung field: Secondary | ICD-10-CM | POA: Diagnosis not present

## 2016-04-19 DIAGNOSIS — J45909 Unspecified asthma, uncomplicated: Secondary | ICD-10-CM | POA: Diagnosis not present

## 2016-04-19 DIAGNOSIS — L97521 Non-pressure chronic ulcer of other part of left foot limited to breakdown of skin: Secondary | ICD-10-CM | POA: Insufficient documentation

## 2016-04-19 DIAGNOSIS — M869 Osteomyelitis, unspecified: Secondary | ICD-10-CM | POA: Insufficient documentation

## 2016-04-19 DIAGNOSIS — Z933 Colostomy status: Secondary | ICD-10-CM | POA: Insufficient documentation

## 2016-04-19 DIAGNOSIS — L89324 Pressure ulcer of left buttock, stage 4: Secondary | ICD-10-CM | POA: Diagnosis not present

## 2016-04-19 DIAGNOSIS — X58XXXA Exposure to other specified factors, initial encounter: Secondary | ICD-10-CM | POA: Insufficient documentation

## 2016-04-19 DIAGNOSIS — S81812A Laceration without foreign body, left lower leg, initial encounter: Secondary | ICD-10-CM | POA: Insufficient documentation

## 2016-04-19 DIAGNOSIS — J690 Pneumonitis due to inhalation of food and vomit: Secondary | ICD-10-CM | POA: Insufficient documentation

## 2016-04-19 DIAGNOSIS — L89154 Pressure ulcer of sacral region, stage 4: Secondary | ICD-10-CM | POA: Diagnosis not present

## 2016-04-19 DIAGNOSIS — Z930 Tracheostomy status: Secondary | ICD-10-CM | POA: Insufficient documentation

## 2016-04-19 DIAGNOSIS — E1069 Type 1 diabetes mellitus with other specified complication: Secondary | ICD-10-CM | POA: Diagnosis not present

## 2016-04-19 DIAGNOSIS — M797 Fibromyalgia: Secondary | ICD-10-CM | POA: Diagnosis not present

## 2016-04-19 DIAGNOSIS — E1043 Type 1 diabetes mellitus with diabetic autonomic (poly)neuropathy: Secondary | ICD-10-CM | POA: Insufficient documentation

## 2016-04-19 DIAGNOSIS — E1040 Type 1 diabetes mellitus with diabetic neuropathy, unspecified: Secondary | ICD-10-CM | POA: Insufficient documentation

## 2016-04-19 DIAGNOSIS — G8250 Quadriplegia, unspecified: Secondary | ICD-10-CM | POA: Diagnosis not present

## 2016-04-19 DIAGNOSIS — S41101A Unspecified open wound of right upper arm, initial encounter: Secondary | ICD-10-CM | POA: Insufficient documentation

## 2016-04-19 DIAGNOSIS — E10621 Type 1 diabetes mellitus with foot ulcer: Secondary | ICD-10-CM | POA: Diagnosis not present

## 2016-04-19 DIAGNOSIS — K3184 Gastroparesis: Secondary | ICD-10-CM | POA: Diagnosis not present

## 2016-04-19 DIAGNOSIS — L89314 Pressure ulcer of right buttock, stage 4: Secondary | ICD-10-CM | POA: Diagnosis not present

## 2016-04-19 DIAGNOSIS — L89103 Pressure ulcer of unspecified part of back, stage 3: Secondary | ICD-10-CM | POA: Insufficient documentation

## 2016-04-21 ENCOUNTER — Other Ambulatory Visit: Payer: Self-pay | Admitting: Family Medicine

## 2016-04-23 ENCOUNTER — Other Ambulatory Visit: Payer: Self-pay | Admitting: Family Medicine

## 2016-04-23 NOTE — Telephone Encounter (Signed)
Can we refill this? 

## 2016-04-23 NOTE — Telephone Encounter (Signed)
Also pt's dad is on schedule to see Dr. Sarajane Jews today, we will also advise him of the below message.

## 2016-04-23 NOTE — Telephone Encounter (Signed)
Per Dr. Sarajane Jews pt should get a order from the wound care clinic for the wound vac, I called and left a voice message with this information for pt's mom.

## 2016-04-23 NOTE — Telephone Encounter (Signed)
I need more information about this please

## 2016-04-23 NOTE — Telephone Encounter (Signed)
I spoke with Jason [name] and pt has sacrum wound on both sides. They are applying a wet to dry dressing once a day, pt was seen by wound care clinic last week.

## 2016-04-24 ENCOUNTER — Other Ambulatory Visit: Payer: Self-pay | Admitting: Family Medicine

## 2016-04-24 NOTE — Telephone Encounter (Signed)
Can we refill this? 

## 2016-04-26 DIAGNOSIS — L89154 Pressure ulcer of sacral region, stage 4: Secondary | ICD-10-CM | POA: Diagnosis not present

## 2016-04-30 IMAGING — CR DG CHEST 2V
2 series · 2 of 2 positions shown · non-contrast
Comparison: DG CHEST 1V PORT dated 02/17/2014

CLINICAL DATA: History of diabetes.  Low grade fever.

EXAM:
CHEST  2 VIEW

[w chest lat]
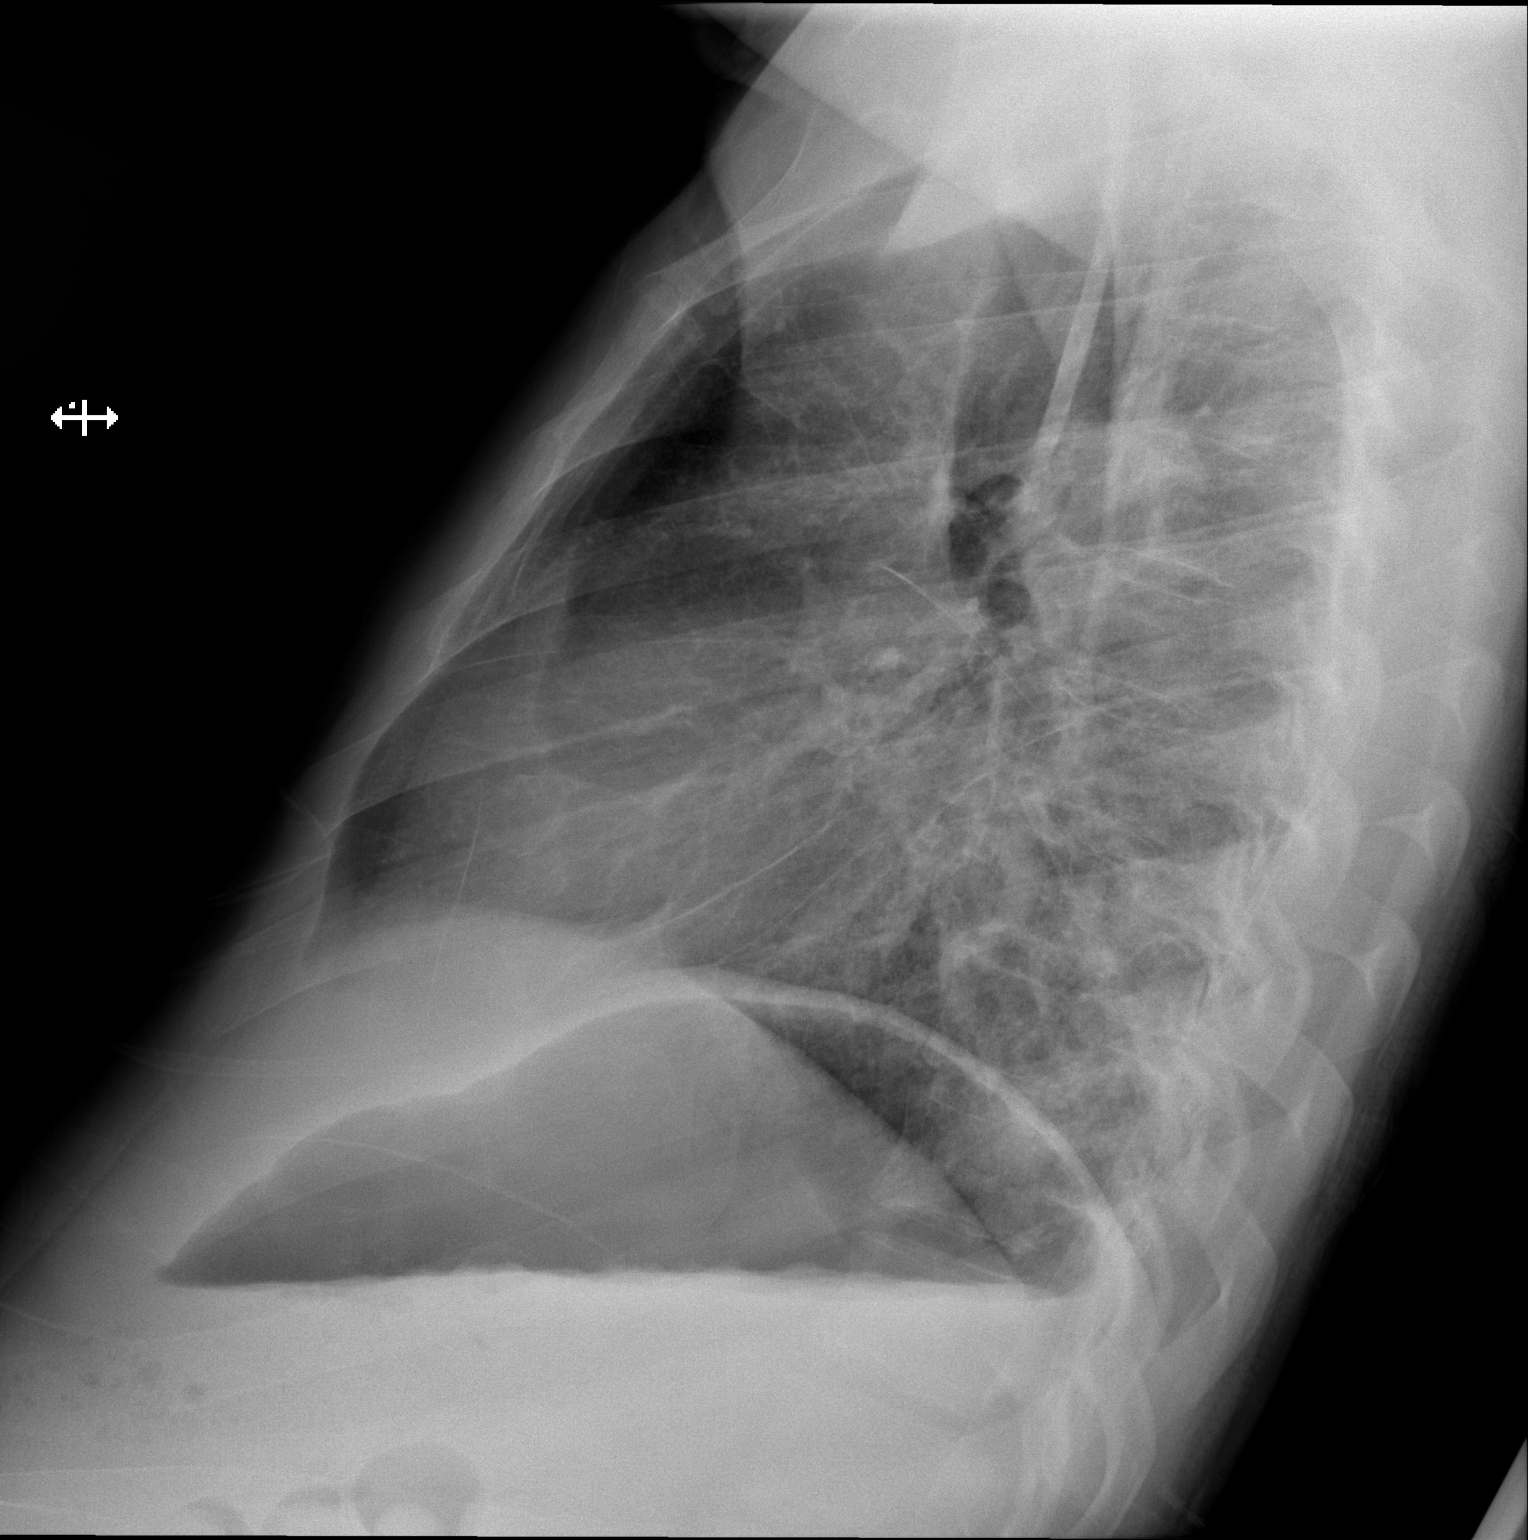

[x chest ap]
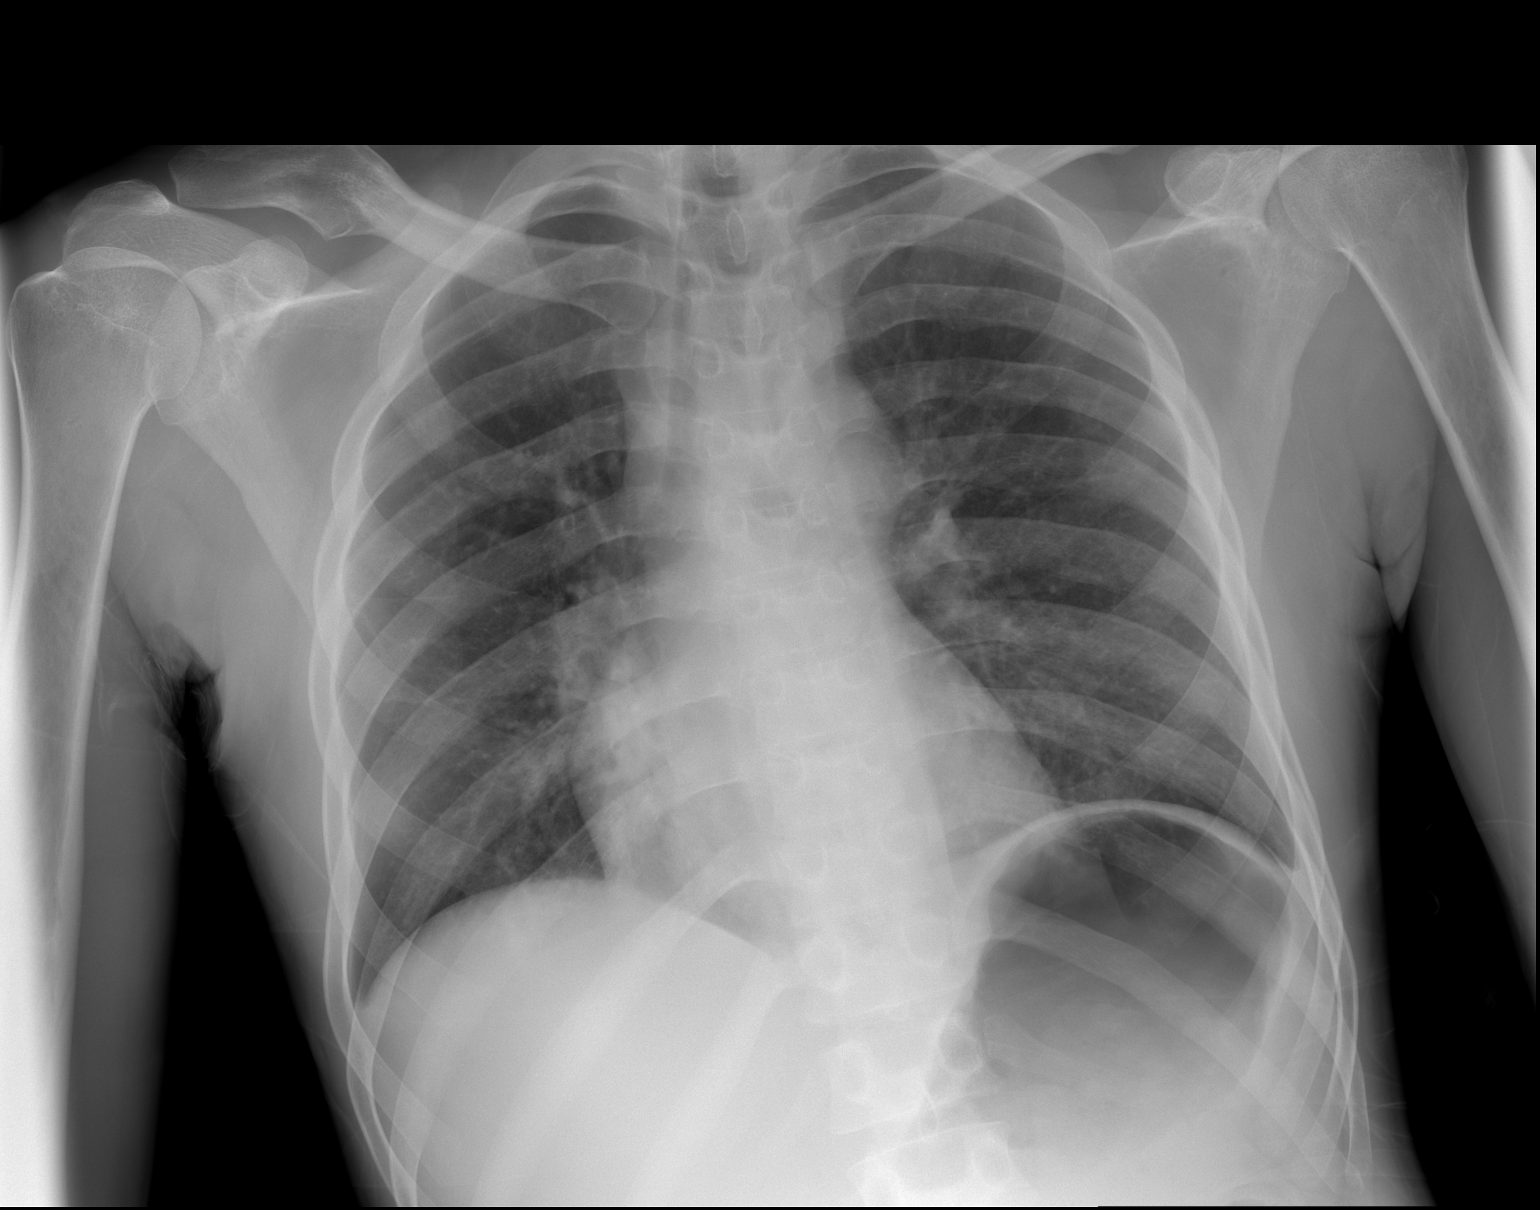

[2 of 2 positions shown; findings below may reference images not displayed]

FINDINGS: Stable cardiac mediastinal contours. No consolidative pulmonary
opacities. No pleural effusion or pneumothorax. Unchanged chronic
fracture of the distal aspect of the left clavicle.
IMPRESSION: No acute cardiopulmonary process.

## 2016-05-02 ENCOUNTER — Ambulatory Visit (HOSPITAL_COMMUNITY): Payer: Medicaid Other

## 2016-05-02 ENCOUNTER — Telehealth: Payer: Self-pay | Admitting: Family Medicine

## 2016-05-02 NOTE — Telephone Encounter (Signed)
Pt need new Rx for Oxycodone

## 2016-05-03 NOTE — Telephone Encounter (Signed)
NO, he already has refills until 05-29-16

## 2016-05-03 NOTE — Telephone Encounter (Signed)
I left a voice message with below information. 

## 2016-05-04 ENCOUNTER — Telehealth: Payer: Self-pay | Admitting: Family Medicine

## 2016-05-04 ENCOUNTER — Telehealth: Payer: Self-pay | Admitting: Internal Medicine

## 2016-05-04 NOTE — Telephone Encounter (Signed)
Per Dr. Sarajane Jews this should go to pulmonary. I did speak with nurse from [facility] and she will contact pulmonary.

## 2016-05-04 NOTE — Telephone Encounter (Signed)
Pt experienced nausea and decrease in appetite yesterday and late last night.  Need order for T-vent for pt

## 2016-05-04 NOTE — Telephone Encounter (Signed)
Spoke with [employer]. States that pt needs some supplies for his trach. Advised her that she would need to call the trach clinic about this. States that pt has an appointment on Wednesday with the trach clinic, they will get what they need then. Nothing further was needed.

## 2016-05-07 NOTE — Telephone Encounter (Signed)
NO the computer records every rx we write and Loral clearly was given 3 months worth of refills for this until 05-29-16. If they cannot keep up with these, we will be forced to give them only one month worth at a time

## 2016-05-07 NOTE — Telephone Encounter (Signed)
Pt dad said he was seen on 05-02-16 and believe dr fry may have gotten rx confuse and he got oxycodone not [place]

## 2016-05-07 NOTE — Telephone Encounter (Signed)
I spoke with mom and she is going to look again for script.

## 2016-05-07 NOTE — Telephone Encounter (Signed)
duplicate

## 2016-05-09 ENCOUNTER — Ambulatory Visit (HOSPITAL_COMMUNITY)
Admission: RE | Admit: 2016-05-09 | Discharge: 2016-05-09 | Disposition: A | Discharge status: Home / Self Care | Payer: Medicaid Other | Source: Ambulatory Visit | Attending: Internal Medicine | Admitting: Internal Medicine

## 2016-05-09 ENCOUNTER — Other Ambulatory Visit: Payer: Self-pay | Admitting: Acute Care

## 2016-05-09 DIAGNOSIS — Z930 Tracheostomy status: Secondary | ICD-10-CM

## 2016-05-09 DIAGNOSIS — Z8701 Personal history of pneumonia (recurrent): Secondary | ICD-10-CM | POA: Insufficient documentation

## 2016-05-09 DIAGNOSIS — Z993 Dependence on wheelchair: Secondary | ICD-10-CM | POA: Insufficient documentation

## 2016-05-09 DIAGNOSIS — G8250 Quadriplegia, unspecified: Secondary | ICD-10-CM | POA: Insufficient documentation

## 2016-05-09 DIAGNOSIS — Z430 Encounter for attention to tracheostomy: Secondary | ICD-10-CM | POA: Diagnosis present

## 2016-05-09 NOTE — Progress Notes (Signed)
    Subjective:  Here for routine trach care   Patient ID: Jason Jason [name], male    DOB: 12/07/1985, 30 y.o.   MRN: [id number]  Jason Jason [name] is a 30 year old male followed by me for management of his chronic trach. 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 He has had trach for almost a year now. His overall health has been further complicated by recurrent MDR infections as well as sacral decub ulcers cared for at the wound care clinic. The last time I saw [name] was while he was in the [name] back in February when he was admitted for MDR pseudomonas PNA. He was critically ill during this stay. Since I saw him last [facility] Jason [name] has had a remarkable recovery. His mother Jason Jason [name] tells me that he had recently went to [name] where he had all but 4 of his teeth extracted in effort to decrease potential source for recurrent infection. Since this time she reports he has done very well w/out fever or respiratory or trach complaints.   Review of Systems  Constitutional: Negative.   HENT: Negative for congestion, facial swelling, sore throat and trouble swallowing.   Eyes: Negative.   Respiratory: Negative for cough, choking, chest tightness, shortness of breath, wheezing and stridor.   Cardiovascular: Negative for chest pain.  Gastrointestinal: Negative.   Endocrine: Negative.   Genitourinary:       Cloudy urine after wound center placed him on Levaquin. Mom concerned about yeast. Planning on calling his PCP  Musculoskeletal: Negative.   Allergic/Immunologic: Negative.   Neurological: Negative.   Hematological: Negative.   Psychiatric/Behavioral: Negative.     Vital signs:blood pressure 164/90, pulse 93, respirations 18 and pulse oximetry 99% Objective:   Physical Exam  Constitutional: He is oriented to person, place, and time. No distress.  Frail quadriplegic mail sitting up in w/c. No distress.   HENT:  Head: Normocephalic.  Mouth/Throat: Oropharynx is  clear and moist.  Now essentially edentulous   Eyes: Conjunctivae are normal. Pupils are equal, round, and reactive to light. No scleral icterus.  Neck: Normal range of motion. Neck supple.  Trach BrandCristy [name] Size: 6.0 Style: Cuffed Secured by: Velcro Stoma site is clear and w/out discharge.   Cardiovascular: Normal rate, regular rhythm and normal heart sounds.  Exam reveals no friction rub.   No murmur heard. Pulmonary/Chest: Effort normal and breath sounds normal. No respiratory distress. He has no wheezes. He has no rales. He exhibits no tenderness.  Abdominal: Soft. Bowel sounds are normal.  Musculoskeletal: He exhibits no edema.  He has bandages on both lower legs. Very little muscle mass.   Neurological: He is alert and oriented to person, place, and time.  Gross upper motor movement only w/ baseline quadriplegia   Skin: Skin is warm and dry. No rash noted. He is not diaphoretic. No erythema.  Psychiatric: He has a normal mood and affect. His behavior is normal. Thought content normal.   Procedure: [facility] [name] was changed w/out difficulty on today's visit.     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 I have placed that order.  ROV 3 months.   Jason Jason [name] ACNP-BC Jason Jason [name] # [phone] OR # [phone] if no answer

## 2016-05-09 NOTE — Progress Notes (Signed)
Tracheostomy Procedure Note  KALOBE BATMAN [id number] [birth year]  Pre Procedure Tracheostomy Information  Trach Brand: Shiley Size: 6.0 Style: Cuffed Secured by: Velcro   Procedure: trach change    Post Procedure Tracheostomy Information  Trach Brand: Shiley Size: 6.0 Style: Cuffed Secured by: Velcro   Post Procedure Evaluation:  ETCO2 positive color change from yellow to purple : Yes.   Vital signs:blood pressure 164/90, pulse 93, respirations 18 and pulse oximetry 99% Patients current condition: stable Complications: No apparent complications Trach site exam: clean, dry Wound care done: dry, clean and 4 x 4 gauze Patient did tolerate procedure well.   Education: None needed at this time  Prescription needs: None needed at this time   Additional needs: Regular PMV given

## 2016-05-10 ENCOUNTER — Telehealth: Payer: Self-pay | Admitting: Family Medicine

## 2016-05-10 ENCOUNTER — Encounter (HOSPITAL_BASED_OUTPATIENT_CLINIC_OR_DEPARTMENT_OTHER): Payer: Medicaid Other | Attending: Internal Medicine

## 2016-05-10 DIAGNOSIS — E1040 Type 1 diabetes mellitus with diabetic neuropathy, unspecified: Secondary | ICD-10-CM | POA: Insufficient documentation

## 2016-05-10 DIAGNOSIS — L89103 Pressure ulcer of unspecified part of back, stage 3: Secondary | ICD-10-CM | POA: Insufficient documentation

## 2016-05-10 DIAGNOSIS — E10621 Type 1 diabetes mellitus with foot ulcer: Secondary | ICD-10-CM | POA: Insufficient documentation

## 2016-05-10 DIAGNOSIS — S31105A Unspecified open wound of abdominal wall, periumbilic region without penetration into peritoneal cavity, initial encounter: Secondary | ICD-10-CM | POA: Insufficient documentation

## 2016-05-10 DIAGNOSIS — M1990 Unspecified osteoarthritis, unspecified site: Secondary | ICD-10-CM | POA: Insufficient documentation

## 2016-05-10 DIAGNOSIS — L97821 Non-pressure chronic ulcer of other part of left lower leg limited to breakdown of skin: Secondary | ICD-10-CM | POA: Insufficient documentation

## 2016-05-10 DIAGNOSIS — L97521 Non-pressure chronic ulcer of other part of left foot limited to breakdown of skin: Secondary | ICD-10-CM | POA: Insufficient documentation

## 2016-05-10 DIAGNOSIS — L89324 Pressure ulcer of left buttock, stage 4: Secondary | ICD-10-CM | POA: Insufficient documentation

## 2016-05-10 DIAGNOSIS — X58XXXA Exposure to other specified factors, initial encounter: Secondary | ICD-10-CM | POA: Insufficient documentation

## 2016-05-10 DIAGNOSIS — L97811 Non-pressure chronic ulcer of other part of right lower leg limited to breakdown of skin: Secondary | ICD-10-CM | POA: Insufficient documentation

## 2016-05-10 DIAGNOSIS — Z933 Colostomy status: Secondary | ICD-10-CM | POA: Insufficient documentation

## 2016-05-10 DIAGNOSIS — L89314 Pressure ulcer of right buttock, stage 4: Secondary | ICD-10-CM | POA: Insufficient documentation

## 2016-05-10 DIAGNOSIS — L89154 Pressure ulcer of sacral region, stage 4: Secondary | ICD-10-CM | POA: Insufficient documentation

## 2016-05-10 MED ORDER — OXYCODONE HCL 20 MG PO TABS: 20.0000 mg | ORAL_TABLET | Freq: Four times a day (QID) | ORAL | 0 refills | Status: DC | PRN

## 2016-05-10 NOTE — Telephone Encounter (Signed)
I wrote for one month ONLY

## 2016-05-10 NOTE — Telephone Encounter (Signed)
I spoke with Jason [name] and they do not have any record of pt getting the script dated March 29, 2016, there or at any other pharmacy. Script must have gotten lost. Pt does need a refill on Oxycodone, maybe a 1 month supply at a time.

## 2016-05-10 NOTE — Telephone Encounter (Signed)
Script is ready for pick up here at front office and I left a voice message for pt with this information.  

## 2016-05-17 DIAGNOSIS — L97821 Non-pressure chronic ulcer of other part of left lower leg limited to breakdown of skin: Secondary | ICD-10-CM | POA: Diagnosis not present

## 2016-05-17 DIAGNOSIS — L89314 Pressure ulcer of right buttock, stage 4: Secondary | ICD-10-CM | POA: Diagnosis present

## 2016-05-17 DIAGNOSIS — E1040 Type 1 diabetes mellitus with diabetic neuropathy, unspecified: Secondary | ICD-10-CM | POA: Diagnosis not present

## 2016-05-17 DIAGNOSIS — L89324 Pressure ulcer of left buttock, stage 4: Secondary | ICD-10-CM | POA: Diagnosis not present

## 2016-05-17 DIAGNOSIS — S31105A Unspecified open wound of abdominal wall, periumbilic region without penetration into peritoneal cavity, initial encounter: Secondary | ICD-10-CM | POA: Diagnosis not present

## 2016-05-17 DIAGNOSIS — L97811 Non-pressure chronic ulcer of other part of right lower leg limited to breakdown of skin: Secondary | ICD-10-CM | POA: Diagnosis not present

## 2016-05-17 DIAGNOSIS — L89103 Pressure ulcer of unspecified part of back, stage 3: Secondary | ICD-10-CM | POA: Diagnosis not present

## 2016-05-17 DIAGNOSIS — M1990 Unspecified osteoarthritis, unspecified site: Secondary | ICD-10-CM | POA: Diagnosis not present

## 2016-05-17 DIAGNOSIS — X58XXXA Exposure to other specified factors, initial encounter: Secondary | ICD-10-CM | POA: Diagnosis not present

## 2016-05-17 DIAGNOSIS — E10621 Type 1 diabetes mellitus with foot ulcer: Secondary | ICD-10-CM | POA: Diagnosis not present

## 2016-05-17 DIAGNOSIS — Z933 Colostomy status: Secondary | ICD-10-CM | POA: Diagnosis not present

## 2016-05-17 DIAGNOSIS — L97521 Non-pressure chronic ulcer of other part of left foot limited to breakdown of skin: Secondary | ICD-10-CM | POA: Diagnosis not present

## 2016-05-17 DIAGNOSIS — L89154 Pressure ulcer of sacral region, stage 4: Secondary | ICD-10-CM | POA: Diagnosis not present

## 2016-05-18 ENCOUNTER — Telehealth: Payer: Self-pay | Admitting: Family Medicine

## 2016-05-18 NOTE — Telephone Encounter (Signed)
I spoke with Hailey at [facility]. Pt has had low blood sugar reading this week 50-59, gave apple juice and then it did go up to 70. He had high readings last week in the 300's. Pt is on a sliding scale. Also he has had some liquid type stools and hard ball type ones in colostomy bag, however today stools are back to normal. Nurse wanted to make you aware of these changes.

## 2016-05-18 NOTE — Telephone Encounter (Signed)
Routed back per request.

## 2016-05-18 NOTE — Telephone Encounter (Signed)
Called and left message with Haliey to Return call to office.

## 2016-05-18 NOTE — Telephone Encounter (Signed)
Changes in pt low and high blood sugars and changes in his out of in his colposcopy and wanted to know if you just wanted to watch or make some changes.  Hailey with [facility] would like a call back.

## 2016-05-18 NOTE — Telephone Encounter (Signed)
This should have been triaged first, can you call [facility] and get more information for Dr. Sarajane Jews?

## 2016-05-18 NOTE — Telephone Encounter (Signed)
noted 

## 2016-05-19 ENCOUNTER — Other Ambulatory Visit: Payer: Self-pay | Admitting: Family Medicine

## 2016-05-24 IMAGING — CR DG ABD PORTABLE 1V
1 series · 1 of 1 positions shown · non-contrast
Comparison: 02/05/2014

CLINICAL DATA: Orogastric tube placement.

EXAM:
PORTABLE ABDOMEN - 1 VIEW

[AP]
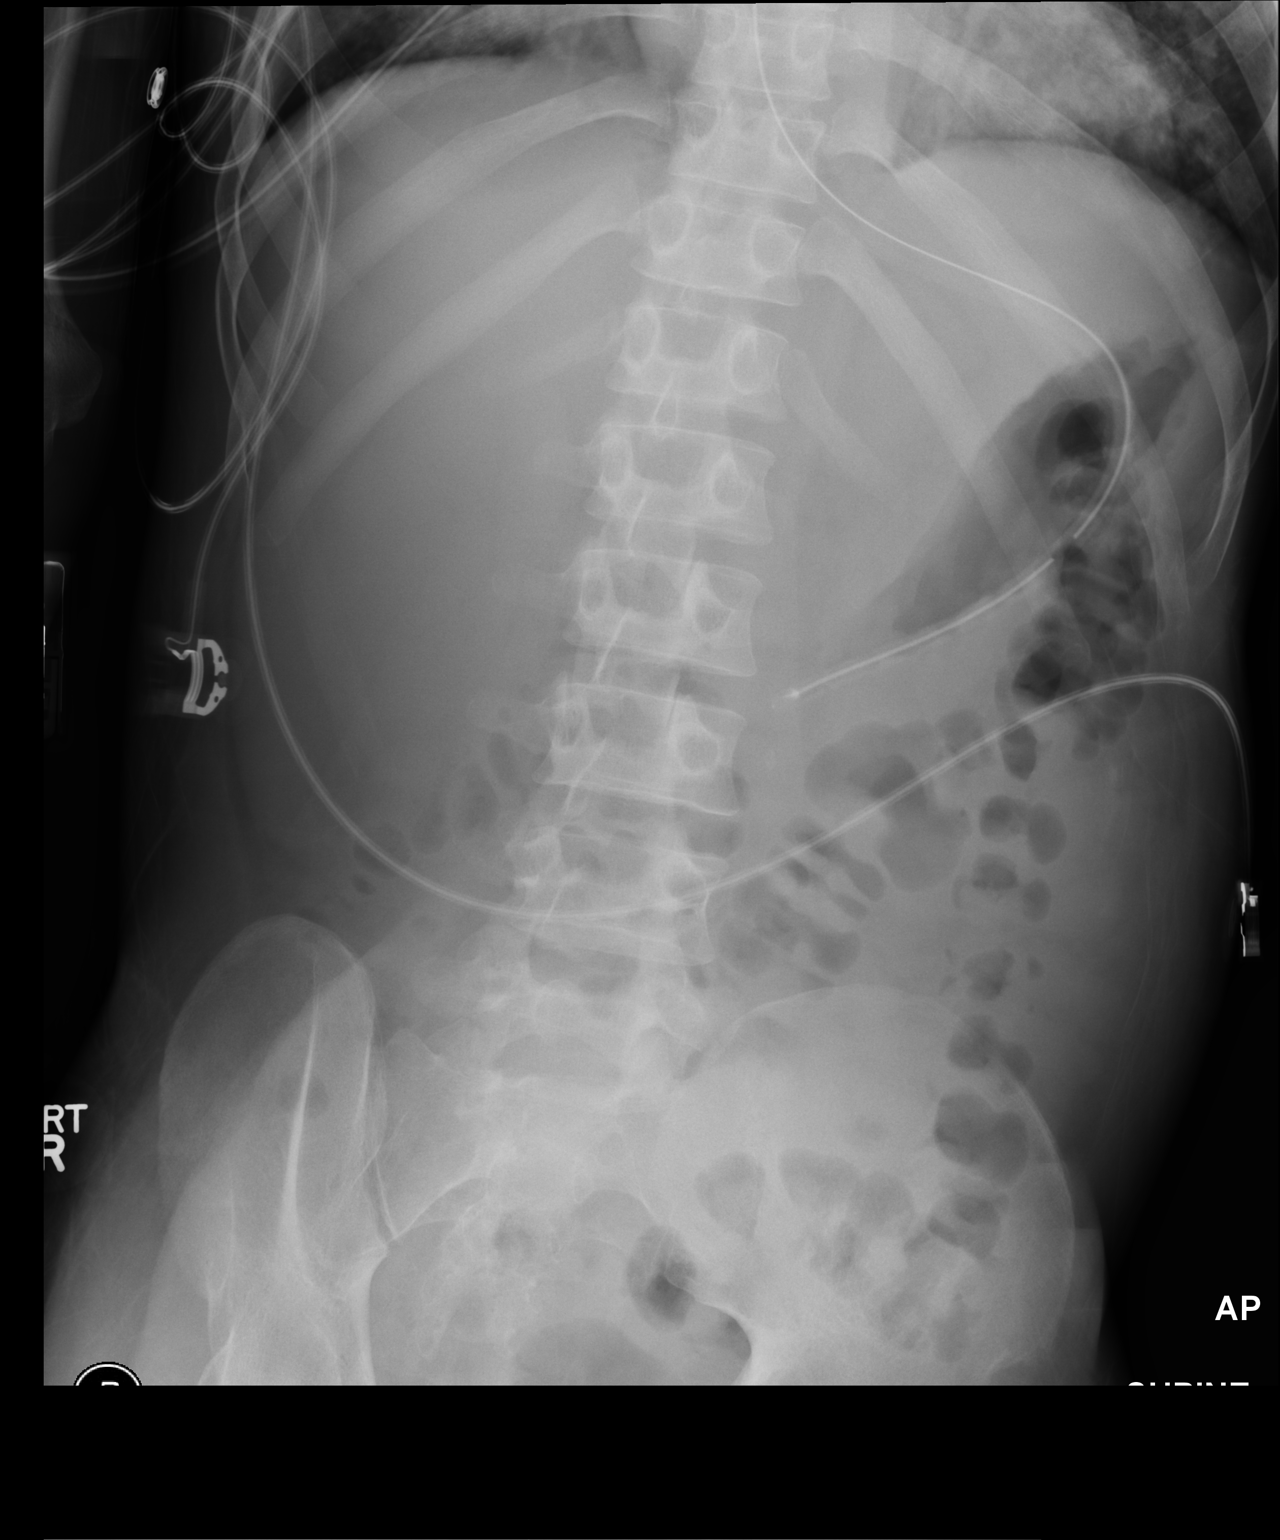

[1 of 1 positions shown; findings below may reference images not displayed]

FINDINGS: Supine view of the abdomen and pelvis. Orogastric tube terminates at
the gastric body. There is a rectal thermister. Non-obstructive
bowel gas pattern. Probable vascular calcifications in the left
hemipelvis.
IMPRESSION: Orogastric tube terminating at the body of the stomach.

## 2016-05-25 IMAGING — CR DG CHEST 1V PORT
1 series · 1 of 1 positions shown · non-contrast
Comparison: Chest x-ray 03/16/2014 and 02/15/2010.

CLINICAL DATA: Intubation.

EXAM:
PORTABLE CHEST - 1 VIEW

[AP]
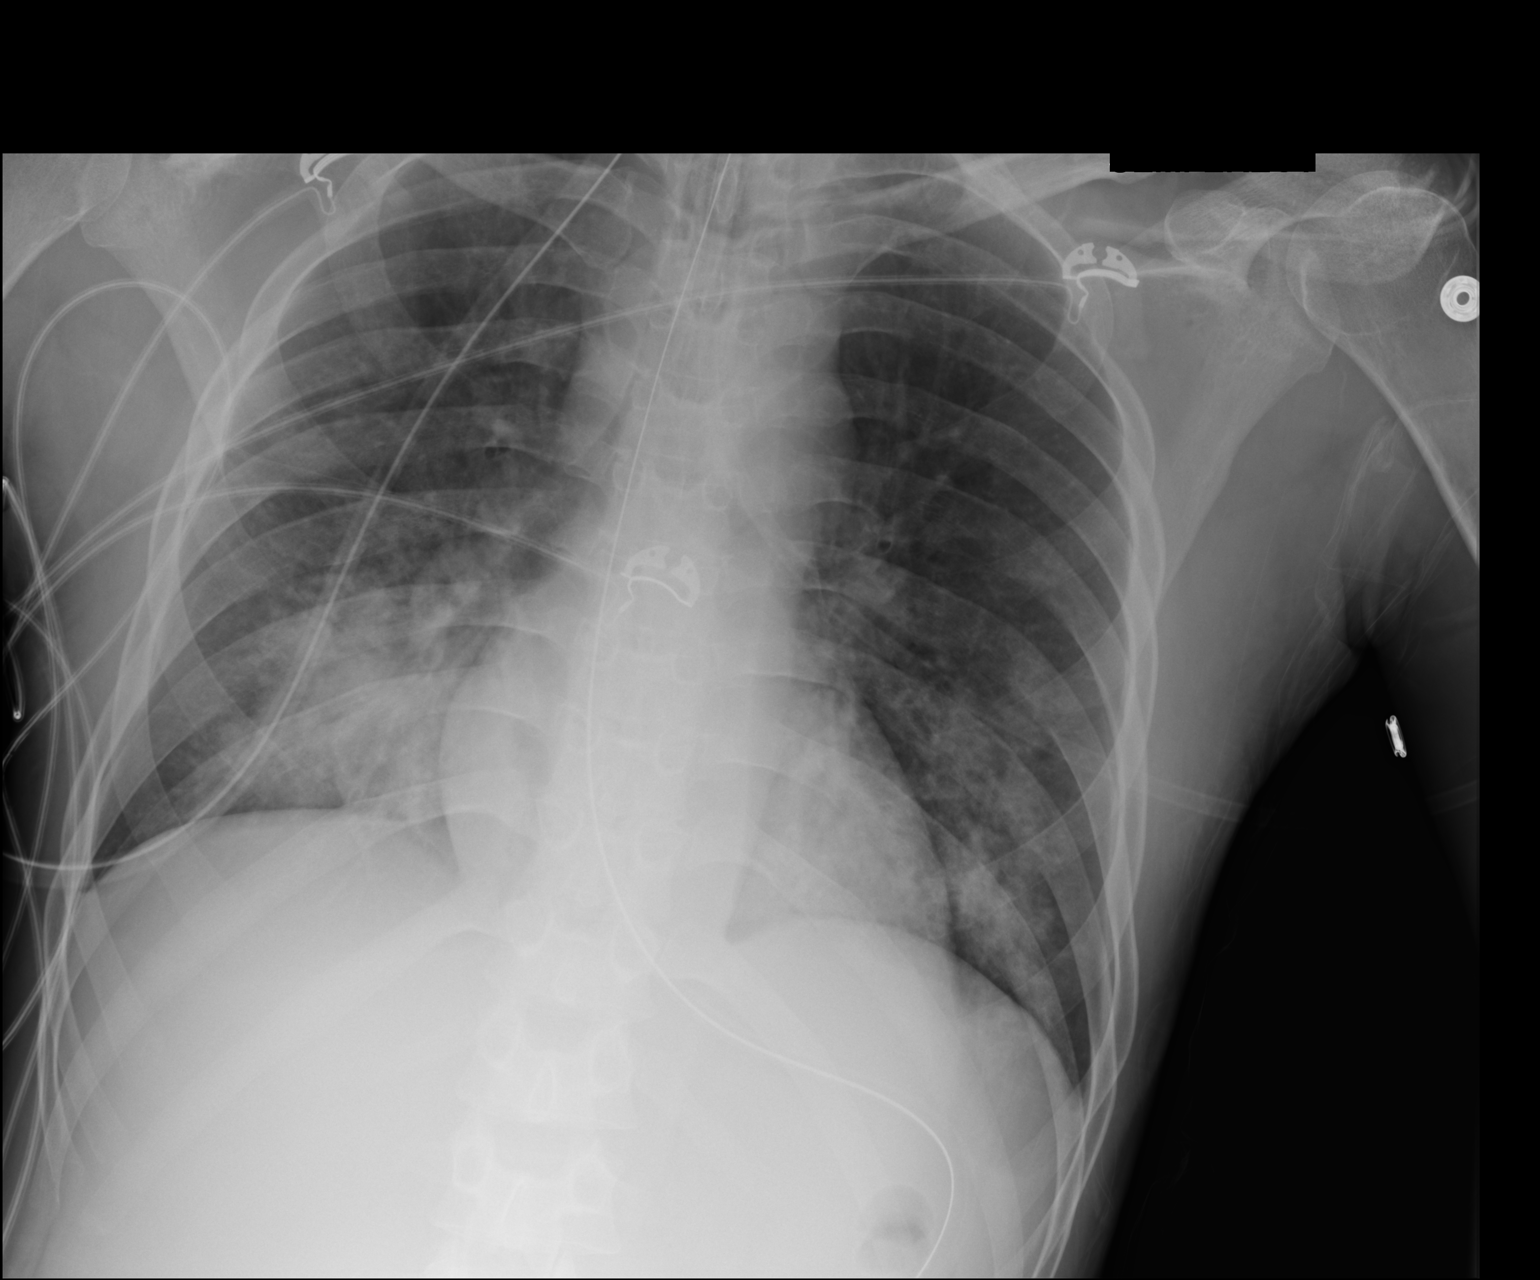

[1 of 1 positions shown; findings below may reference images not displayed]

FINDINGS: Interim placement of NG tube, tube in good anatomic position.
Endotracheal tube in good anatomic position. Bibasilar pulmonary
infiltrates are again noted. These have improved slightly from prior
exam. Heart size and pulmonary vascularity are stable. No pulmonary
venous congestion. No pleural effusion or pneumothorax. Deformity of
the distal clavicles again noted. These changes are stable from
multiple prior exams. These changes may be related to prior trauma.
IMPRESSION: 1. Interim placement of NG tube, its tip is below the left
hemidiaphragm. Endotracheal tube in stable position.
2. Persistent bilateral pulmonary alveolar infiltrates with slight
improvement.

## 2016-05-25 NOTE — Telephone Encounter (Signed)
Call in #150 with 5 rf 

## 2016-05-26 IMAGING — CR DG CHEST 1V PORT
1 series · 1 of 1 positions shown · non-contrast
Comparison: 03/17/2014.

CLINICAL DATA: Shortness of breath.

EXAM:
PORTABLE CHEST - 1 VIEW

[portable]
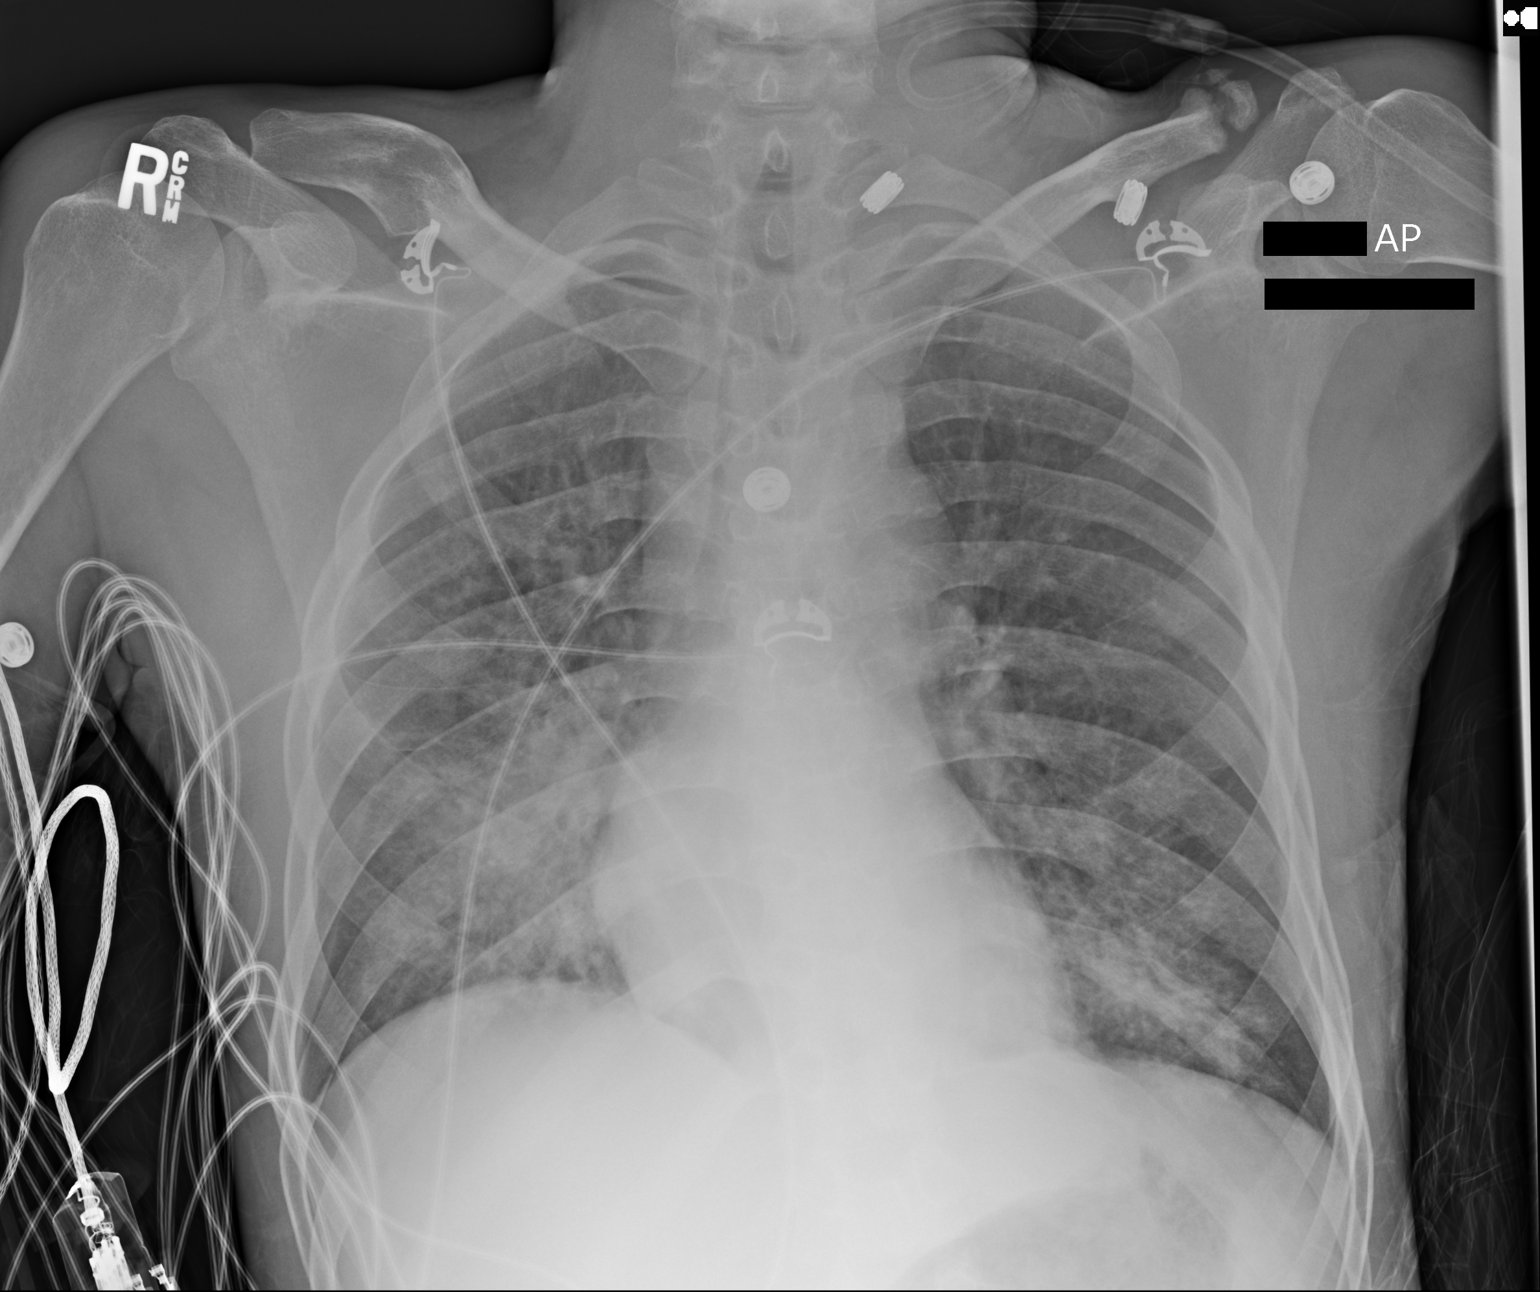

[1 of 1 positions shown; findings below may reference images not displayed]

FINDINGS: Bibasilar pulmonary infiltrates again noted consistent with
pneumonia. Heart size normal. No pleural effusion or pneumothorax.
No acute bony abnormality. Deformity of the distal clavicles noted
bilaterally. These changes are chronic and stable. These changes may
be related to prior trauma.
IMPRESSION: 1.  Interim removal of NG tube.

2.  Bibasilar pulmonary infiltrates.  No interim change.

## 2016-05-27 IMAGING — CR DG CHEST 1V PORT
2 series · 2 of 2 positions shown · non-contrast
Comparison: 03/18/2014 and 03/17/2014

CLINICAL DATA: History of shortness of breath.

EXAM:
PORTABLE CHEST - 1 VIEW

[AP (1 of 2)]
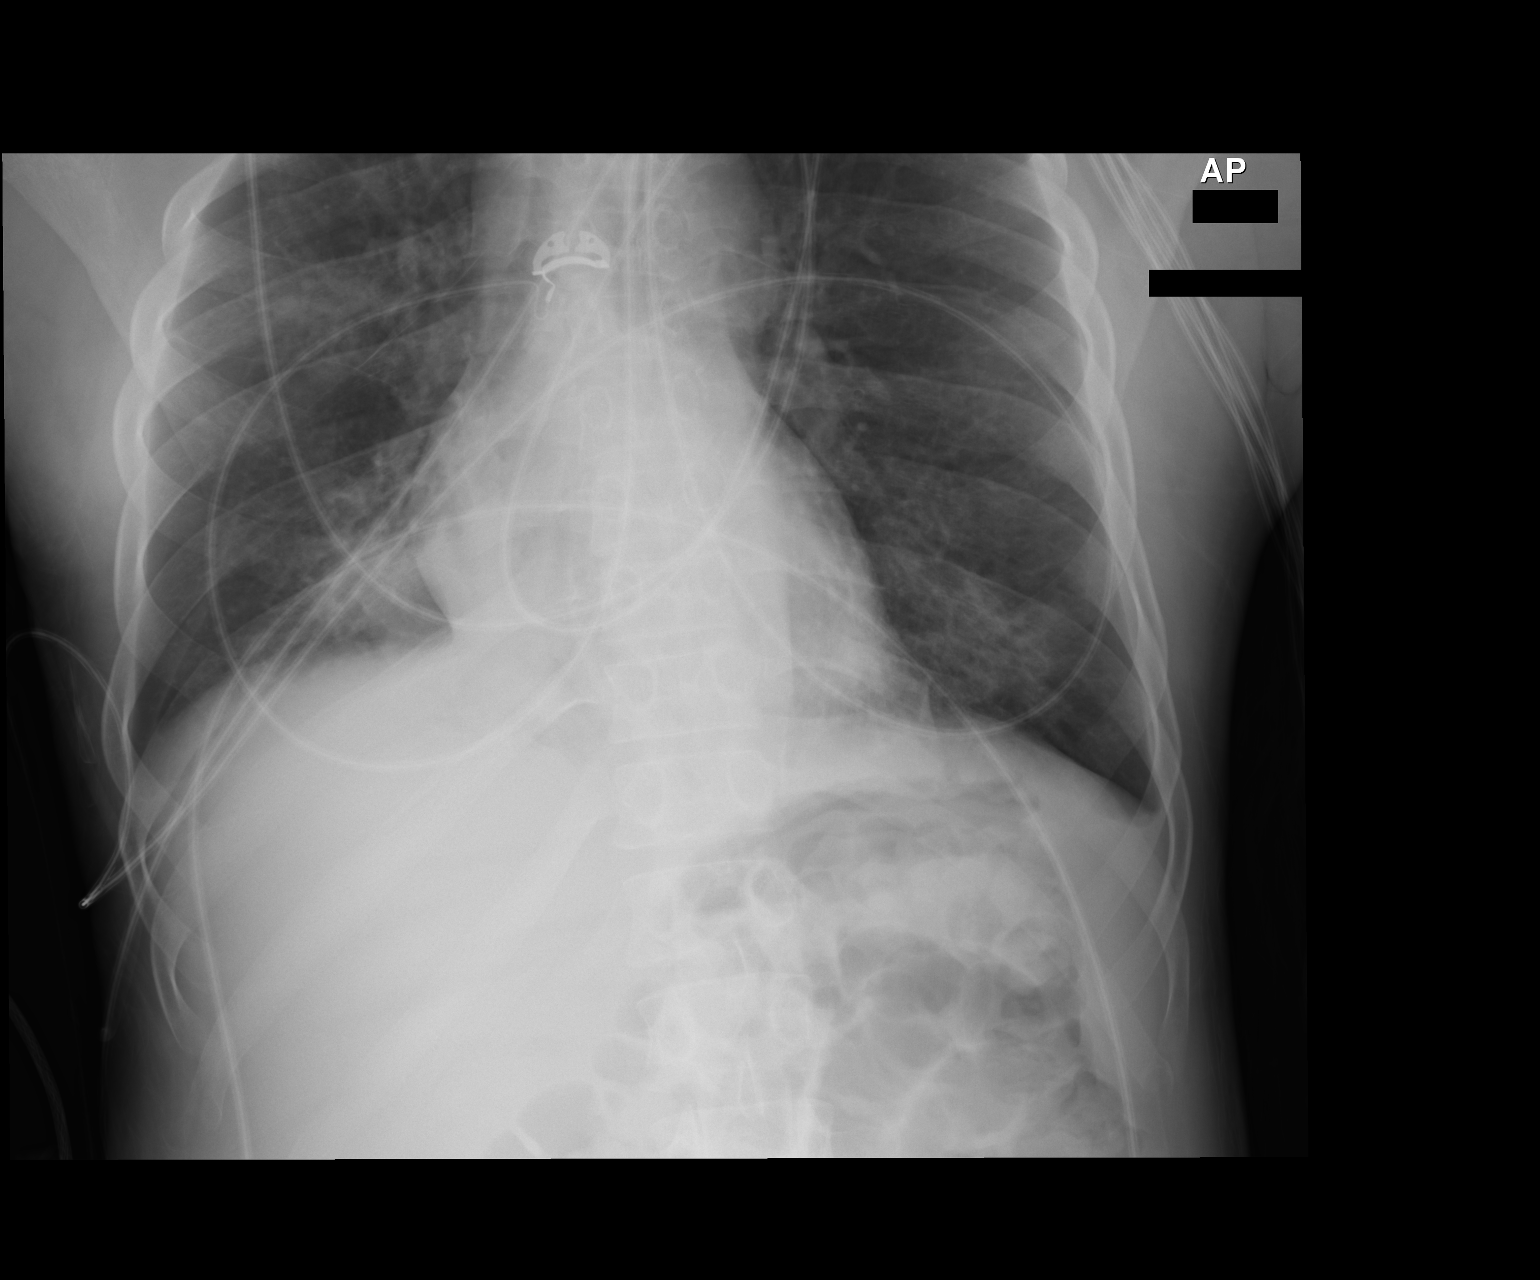

[AP (2 of 2)]
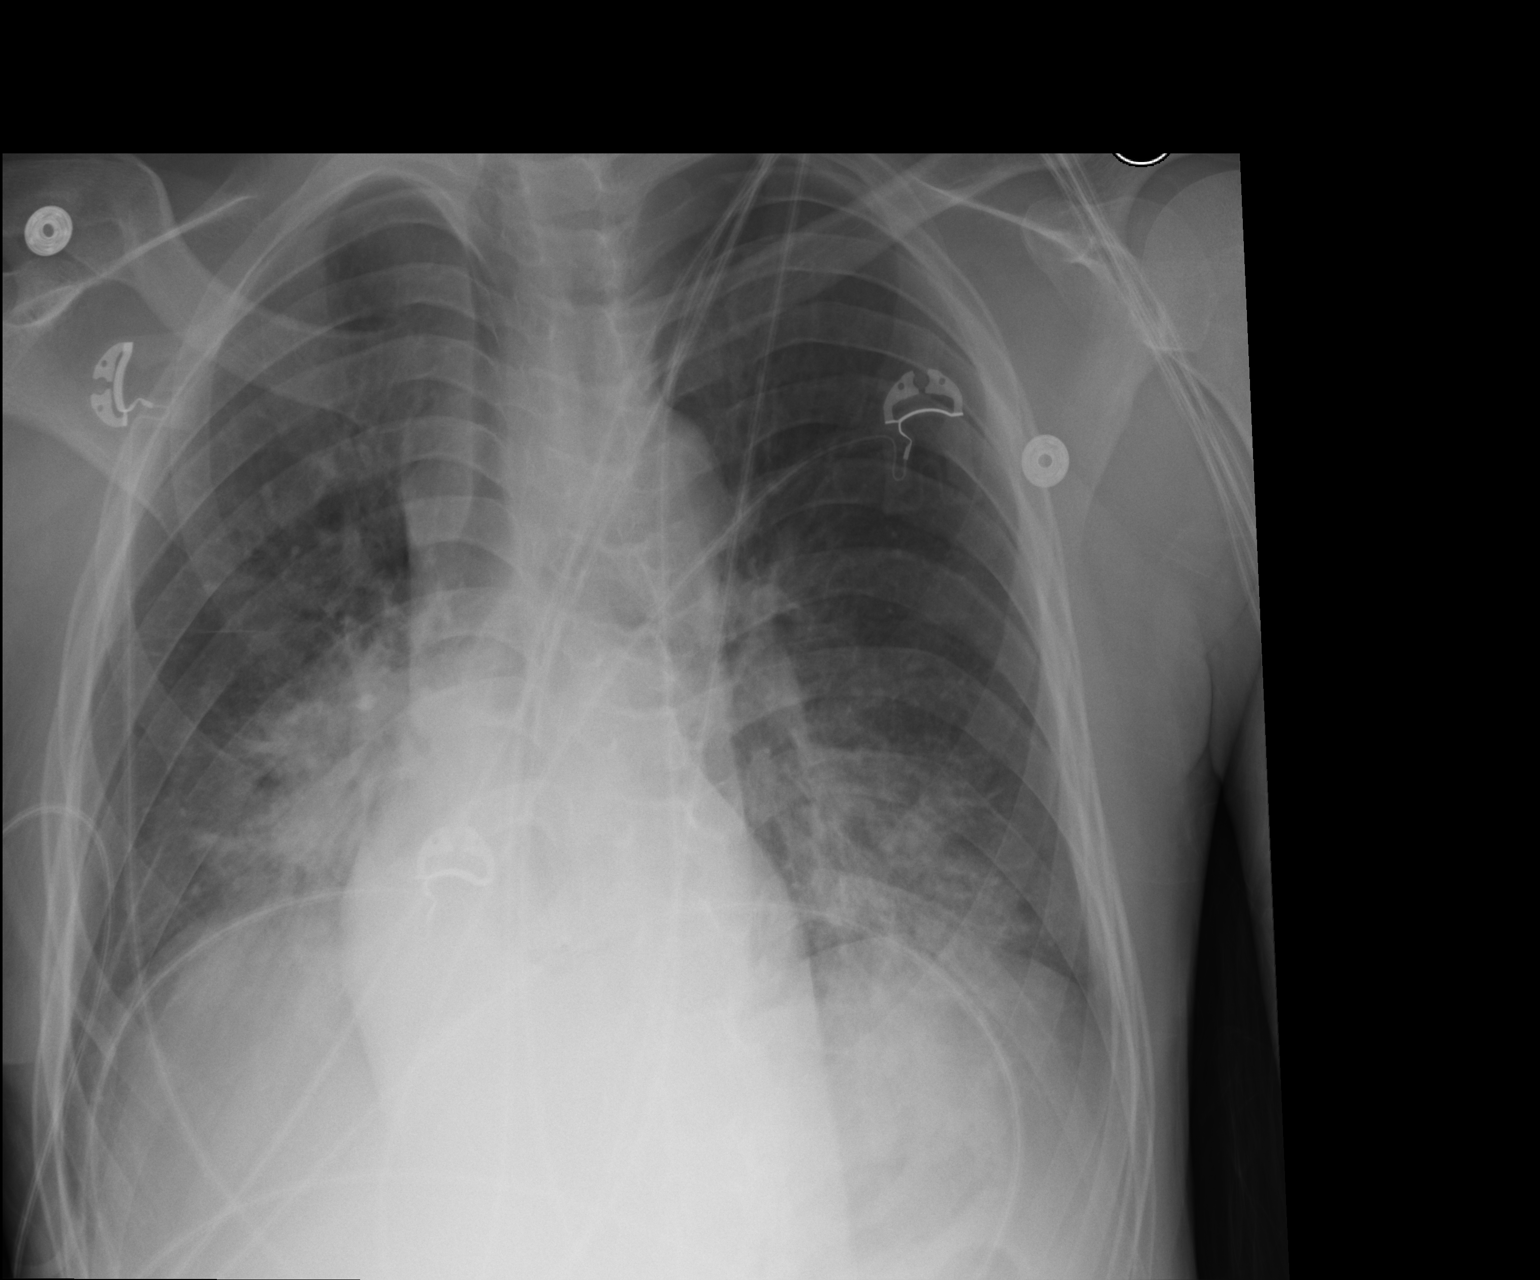

[2 of 2 positions shown; findings below may reference images not displayed]

FINDINGS: There are persistent parenchymal densities at both lung bases. There
are also densities in the right hilar region. Findings are
concerning for airspace disease and infection. Heart size is normal.
No evidence for pneumothorax.
IMPRESSION: There has been minimal change in the bibasilar parenchymal lung
densities. Findings are concerning for infection.

## 2016-05-28 IMAGING — CR DG CHEST 1V PORT
1 series · 1 of 1 positions shown · non-contrast
Comparison: 03/19/2014

CLINICAL DATA: Infiltrate

EXAM:
PORTABLE CHEST - 1 VIEW

[AP]
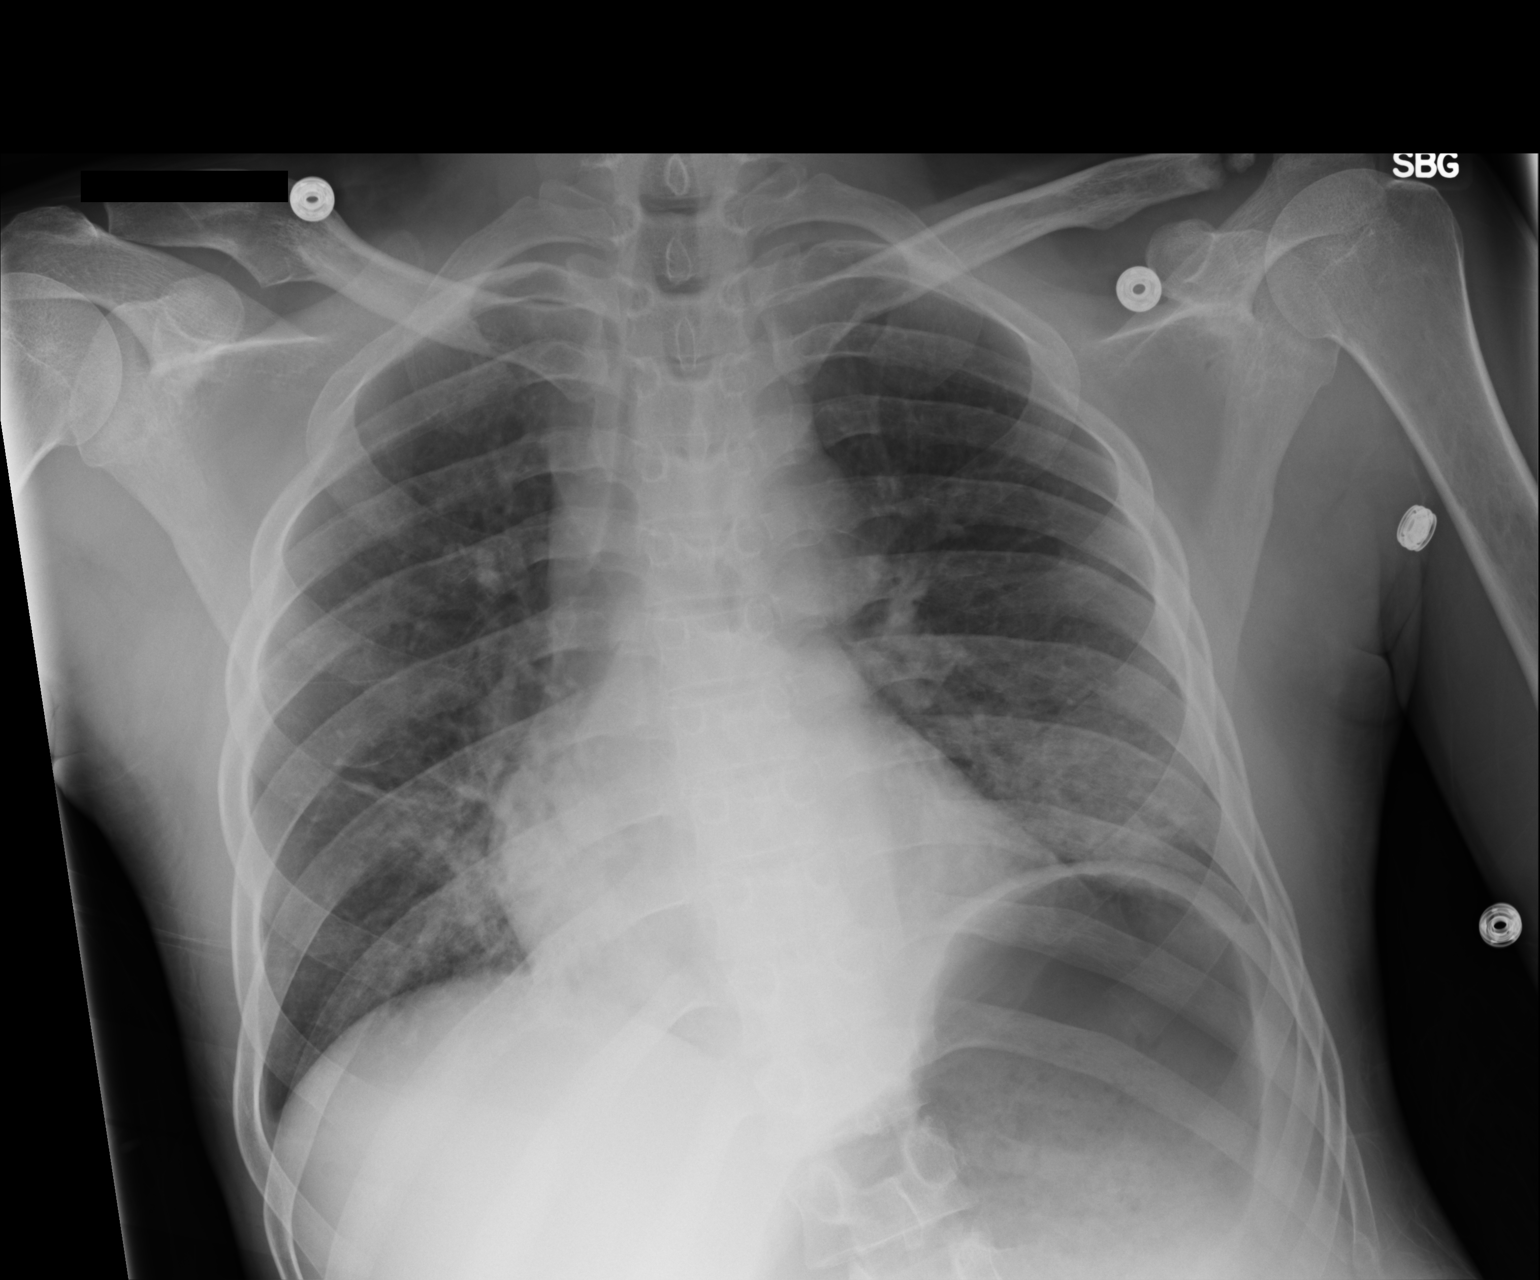

[1 of 1 positions shown; findings below may reference images not displayed]

FINDINGS: Cardiomediastinal silhouette is stable. Persistent streaky
infiltrates right perihilar, infrahilar and left base. Findings
suspicious for pneumonia. Follow-up to resolution recommended.
IMPRESSION: Persistent streaky infiltrates right perihilar, infrahilar and left
base. Findings suspicious for pneumonia. Follow-up to resolution
recommended.

## 2016-05-29 ENCOUNTER — Other Ambulatory Visit: Payer: Self-pay | Admitting: Physical Medicine & Rehabilitation

## 2016-05-29 ENCOUNTER — Other Ambulatory Visit: Payer: Self-pay | Admitting: Family Medicine

## 2016-05-29 DIAGNOSIS — M869 Osteomyelitis, unspecified: Secondary | ICD-10-CM

## 2016-05-29 DIAGNOSIS — L89159 Pressure ulcer of sacral region, unspecified stage: Secondary | ICD-10-CM

## 2016-05-29 NOTE — Telephone Encounter (Signed)
done

## 2016-05-29 NOTE — Telephone Encounter (Signed)
I spoke with mom, pt also needs refll on Baclofen, he is out. Call in to [place].

## 2016-05-30 ENCOUNTER — Other Ambulatory Visit: Payer: Self-pay | Admitting: Family Medicine

## 2016-05-30 ENCOUNTER — Telehealth: Payer: Self-pay | Admitting: Family Medicine

## 2016-05-30 DIAGNOSIS — M869 Osteomyelitis, unspecified: Secondary | ICD-10-CM

## 2016-05-30 DIAGNOSIS — L89159 Pressure ulcer of sacral region, unspecified stage: Secondary | ICD-10-CM

## 2016-05-30 MED ORDER — BACLOFEN 20 MG PO TABS: 20.0000 mg | ORAL_TABLET | Freq: Four times a day (QID) | ORAL | 1 refills | Status: DC

## 2016-05-30 NOTE — Telephone Encounter (Signed)
I sent script e-scribe. 

## 2016-05-30 NOTE — Telephone Encounter (Signed)
Refill request for Baclofen and send to [facility]. Per Dr. Sarajane Jews okay to refill.

## 2016-05-31 ENCOUNTER — Telehealth: Payer: Self-pay | Admitting: Family Medicine

## 2016-05-31 NOTE — Telephone Encounter (Signed)
I spoke with Jeneen Rinks at home health and went over below message, he will follow this advice.

## 2016-05-31 NOTE — Telephone Encounter (Signed)
Patient Name: Jason [name] DOB: 1986-04-03 Initial Comment [place] RN, pt has trach and colostomy bag, but has urge to have a bm. Had a seizure last night, possibly due to not getting his rx for baclofen. Nurse Assessment Nurse: Roosvelt Maser, RN, Barnetta Chapel Date/Time (Eastern Time): 05/31/2016 9:30:43 AM Confirm and document reason for call. If symptomatic, describe symptoms. You must click the next button to save text entered. ---home health nurse states pt has ostomy and trach, has been complaining that he feels the need to have a bm for the past couple of days and it is bothering him, this happens occasionally but the family wanted me to call. (nurse states he did not have a seizure last night as noted previously at time of call) Has the patient traveled out of the country within the last 30 days? ---Not Applicable Does the patient have any new or worsening symptoms? ---Yes Will a triage be completed? ---No Select reason for no triage. ---Other Please document clinical information provided and list any resource used. ---called office to warm transfer to dr fry nurse for further information regarding complicated pt. Nurse Sunday Spillers will be calling home health nurse back. Guidelines Guideline Title Affirmed Question Affirmed Notes Final Disposition User Clinical Call [place], RN, Alroy Bailiff - [place] Nurse

## 2016-05-31 NOTE — Telephone Encounter (Signed)
I am not sure how to advise him about this. I certainly do not think this presents any danger, but it may be uncomfortable for him. If it continues I suggest they ask his surgeon, Dr. Newton Pigg, about this

## 2016-06-01 DIAGNOSIS — L89314 Pressure ulcer of right buttock, stage 4: Secondary | ICD-10-CM | POA: Diagnosis not present

## 2016-06-05 ENCOUNTER — Telehealth: Payer: Self-pay | Admitting: Family Medicine

## 2016-06-05 MED ORDER — OXYCODONE HCL 20 MG PO TABS: 20.0000 mg | ORAL_TABLET | Freq: Four times a day (QID) | ORAL | 0 refills | Status: DC | PRN

## 2016-06-05 NOTE — Telephone Encounter (Signed)
Rx up front and Mom informed.

## 2016-06-05 NOTE — Telephone Encounter (Signed)
done

## 2016-06-05 NOTE — Telephone Encounter (Signed)
° °  Butch Penny from [facility] call to ask for a new rx for the following med      Oxycodone HCl 20 MG TABS

## 2016-06-06 ENCOUNTER — Ambulatory Visit: Payer: Medicaid Other | Admitting: Infectious Disease
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
## 2016-06-12 ENCOUNTER — Telehealth: Payer: Self-pay

## 2016-06-12 NOTE — Telephone Encounter (Signed)
Received PA request form from [facility] for Oxycodone 20mg . PA submitted via form that has to be used for controlled substances from [facility] Medicaid. Awaiting response.

## 2016-06-14 ENCOUNTER — Other Ambulatory Visit: Payer: Self-pay | Admitting: Family Medicine

## 2016-06-15 ENCOUNTER — Other Ambulatory Visit: Payer: Self-pay | Admitting: Family Medicine

## 2016-06-15 NOTE — Telephone Encounter (Signed)
Faxed paper back to Pharmacy stating that PA was submitted via form required by Medicaid and no answer has been given yet.

## 2016-06-18 ENCOUNTER — Encounter: Payer: Self-pay | Admitting: Infectious Disease

## 2016-06-18 ENCOUNTER — Ambulatory Visit (INDEPENDENT_AMBULATORY_CARE_PROVIDER_SITE_OTHER): Payer: Medicaid Other | Admitting: Infectious Disease

## 2016-06-18 VITALS — BP 165/94 | HR 76 | Temp 97.6°F | Ht 67.75 in

## 2016-06-18 DIAGNOSIS — N390 Urinary tract infection, site not specified: Secondary | ICD-10-CM

## 2016-06-18 DIAGNOSIS — M86659 Other chronic osteomyelitis, unspecified thigh: Secondary | ICD-10-CM | POA: Insufficient documentation

## 2016-06-18 DIAGNOSIS — A499 Bacterial infection, unspecified: Secondary | ICD-10-CM | POA: Diagnosis not present

## 2016-06-18 DIAGNOSIS — J189 Pneumonia, unspecified organism: Secondary | ICD-10-CM

## 2016-06-18 DIAGNOSIS — Z1624 Resistance to multiple antibiotics: Secondary | ICD-10-CM

## 2016-06-18 DIAGNOSIS — Z930 Tracheostomy status: Secondary | ICD-10-CM | POA: Diagnosis not present

## 2016-06-18 DIAGNOSIS — A498 Other bacterial infections of unspecified site: Secondary | ICD-10-CM | POA: Diagnosis not present

## 2016-06-18 DIAGNOSIS — R6520 Severe sepsis without septic shock: Secondary | ICD-10-CM

## 2016-06-18 DIAGNOSIS — L8990 Pressure ulcer of unspecified site, unspecified stage: Secondary | ICD-10-CM | POA: Diagnosis not present

## 2016-06-18 DIAGNOSIS — M86651 Other chronic osteomyelitis, right thigh: Secondary | ICD-10-CM

## 2016-06-18 DIAGNOSIS — M86151 Other acute osteomyelitis, right femur: Secondary | ICD-10-CM | POA: Diagnosis present

## 2016-06-18 DIAGNOSIS — A419 Sepsis, unspecified organism: Secondary | ICD-10-CM

## 2016-06-18 DIAGNOSIS — Z9359 Other cystostomy status: Secondary | ICD-10-CM

## 2016-06-18 HISTORY — DX: Other chronic osteomyelitis, unspecified thigh: M86.659

## 2016-06-18 LAB — COMPLETE METABOLIC PANEL WITH GFR
ALT: 9 U/L (ref 9–46)
AST: 13 U/L (ref 10–40)
Albumin: 2.8 g/dL — ABNORMAL LOW (ref 3.6–5.1)
Alkaline Phosphatase: 144 U/L — ABNORMAL HIGH (ref 40–115)
BILIRUBIN TOTAL: 0.2 mg/dL (ref 0.2–1.2)
BUN: 12 mg/dL (ref 7–25)
CALCIUM: 8.9 mg/dL (ref 8.6–10.3)
CO2: 27 mmol/L (ref 20–31)
CREATININE: 0.63 mg/dL (ref 0.60–1.35)
Chloride: 104 mmol/L (ref 98–110)
Glucose, Bld: 232 mg/dL — ABNORMAL HIGH (ref 65–99)
Potassium: 4.5 mmol/L (ref 3.5–5.3)
Sodium: 139 mmol/L (ref 135–146)
TOTAL PROTEIN: 6.8 g/dL (ref 6.1–8.1)

## 2016-06-18 LAB — CBC WITH DIFFERENTIAL/PLATELET
Basophils Absolute: 0 cells/uL (ref 0–200)
Basophils Relative: 0 %
EOS PCT: 3 %
Eosinophils Absolute: 456 cells/uL (ref 15–500)
HCT: 30.2 % — ABNORMAL LOW (ref 38.5–50.0)
HEMOGLOBIN: 9.3 g/dL — AB (ref 13.2–17.1)
LYMPHS ABS: 1824 {cells}/uL (ref 850–3900)
Lymphocytes Relative: 12 %
MCH: 25.1 pg — ABNORMAL LOW (ref 27.0–33.0)
MCHC: 30.8 g/dL — AB (ref 32.0–36.0)
MCV: 81.4 fL (ref 80.0–100.0)
MONOS PCT: 13 %
MPV: 9.3 fL (ref 7.5–12.5)
Monocytes Absolute: 1976 cells/uL — ABNORMAL HIGH (ref 200–950)
NEUTROS ABS: 10944 {cells}/uL — AB (ref 1500–7800)
NEUTROS PCT: 72 %
PLATELETS: 699 10*3/uL — AB (ref 140–400)
RBC: 3.71 MIL/uL — AB (ref 4.20–5.80)
RDW: 16.6 % — ABNORMAL HIGH (ref 11.0–15.0)
WBC: 15.2 10*3/uL — AB (ref 3.8–10.8)

## 2016-06-18 NOTE — Progress Notes (Signed)
Chief complaint : Transient was referred back to our clinic due to concerns about reemerging osteomyelitis.  Subjective:    Patient ID: Jason [name], male    DOB: 03-Mar-1986, 30 y.o.   MRN: [id number]  HPI  30 y.o. 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 Migdalia Dk Dillingham and multiple courses of antibiotics recurrent admissions with septic shock.  I saw him in July thru August 2016 when he was admitted with worsening decubitus ulcers, osteomyelitis with MRSA bacteremia, microaerophlic bacteremia and then VAP (rx with acinetobacter and persistent fevers sp tygacil and gentamicin and then imipenem and gentamicin) His MRSA and microaerophillic strep were rx with vancomycin, zyvox and vancomycin again.   He had to have tracheostomy due to respiratory failure and had a protracted hospital course before being dc to home with a home ventilator and skilled in home nursing. He then weaned off the vent   He was  hospitalized in November with what with spot at that time to be a healthcare associated pneumonia. He was treated with carbapenem therapy but again developed confusion and hallucinations along with eosinophilia. I see no myself during that hospitalization he was also seen by Dr. Baxter Flattery who changed him from his carbapenem to aztreonam.  He is ultimately discharged to home and since then has been seen in follow-up by Jerrye Bushy with [facility]. He was placed on a seven-day course of doxycycline for tracheitis that was further extended.    He is being followed the wound care clinic with hyperbaric oxygen.   He has been followed closely also at home with a home health nurse as well as involvement by his father who came today to clinic with the home health nurse. He has managed to stay the hospital for many months now going back to January. He has done relatively well and  Marni Griffon was quite happy with his progress from a respiratory standpoint. His tracheostomy site was exchanged.  He has had persistent pubis ulcers and apparently emergence of bone in one of the wounds. Per the report of the home nurse who accompanied Jason [name] some of this bone was debridement and sent for culture and this yielded MRSA. I do not have access to that culture but apparently the patient was treated with levofloxacin for 10 days.  It is my understanding that his wound care physician and his mother concerned for worsening osteomyelitis and he was therefore referred to our clinic.  Jason [name] does endorse some fevers over the last 3 days but no confusion nausea or vomiting. He has not had increased cough and has otherwise felt well other than the fact that his wounds are not healing at a satisfactory rate and the concern for new osteomyelitis.   Past Medical History:  Diagnosis Date  . Arthritis   . Asthma   . Diabetic neuropathy ([place])   . Dysrhythmia   . Family history of anesthesia complication    Pt mother can't have epidural procedures  . Fibromyalgia   . Gastroparesis   . GERD (gastroesophageal reflux disease)   . Hx MRSA infection    on face  . Multiple allergies 11/02/2015  . Pneumonia   . Recurrent UTI 11/02/2015  . Seizures ([place])   . Stroke ([place]) 01/29/2014   spinal stroke ; "quadriplegic since"  . Syncope 02/16/2015  . Type I diabetes mellitus ([place])    sees Dr. Loanne Drilling     Past  Surgical History:  Procedure Laterality Date  . APPLICATION OF A-CELL OF BACK N/A 11/12/2014   Procedure: APPLICATION A CELL AND VAC ;  Surgeon: Theodoro Kos, DO;  Location: [place];  Service: Plastics;  Laterality: N/A;  . APPLICATION OF A-CELL OF BACK N/A 12/08/2014   Procedure: APPLICATION OF A-CELL AND WOUND VAC ;  Surgeon: Theodoro Kos, DO;  Location: [place];  Service: Plastics;  Laterality: N/A;  . APPLICATION OF A-CELL OF CHEST/ABDOMEN N/A 08/04/2015   Procedure: APPLICATION OF A-CELL ;   Surgeon: Loel Lofty Dillingham, DO;  Location: [place];  Service: Plastics;  Laterality: N/A;  . DEBRIDMENT OF DECUBITUS ULCER N/A 10/04/2014   Procedure: DEBRIDMENT OF DECUBITUS ULCER;  Surgeon: Georganna Skeans, MD;  Location: [place];  Service: General;  Laterality: N/A;  . HEMATOMA EVACUATION N/A 05/05/2015   Procedure: EVACUATION HEMATOMA bedside procedure;  Surgeon: Theodoro Kos, DO;  Location: [place];  Service: Plastics;  Laterality: N/A;  . INCISION AND DRAINAGE OF WOUND N/A 11/12/2014   Procedure: IRRIGATION AND DEBRIDEMENT OF WOUNDS WITH BONE BIOPSY AND SURGICAL PREP ;  Surgeon: Theodoro Kos, DO;  Location: [place];  Service: Plastics;  Laterality: N/A;  . INCISION AND DRAINAGE OF WOUND N/A 11/18/2014   Procedure: IRRIGATION AND DEBRIDEMENT OF SACRAL ULCER ONLY WITH PLACEMENT OF A CELL AND VAC/ DRESSING CHANGE TO UPPER BACK AREA.;  Surgeon: Theodoro Kos, DO;  Location: [place];  Service: Plastics;  Laterality: N/A;  . INCISION AND DRAINAGE OF WOUND N/A 11/25/2014   Procedure: IRRIGATION AND DEBRIDEMENT OF SACRAL ULCER AND BACK BURN WITH PLACEMENT OF A-CELL;  Surgeon: Theodoro Kos, DO;  Location: [place];  Service: Plastics;  Laterality: N/A;  . INCISION AND DRAINAGE OF WOUND Right 12/08/2014   Procedure: IRRIGATION AND DEBRIDEMENT SACRAL WOUND AND RIGHT ISCHIAL WOUND ;  Surgeon: Theodoro Kos, DO;  Location: [place];  Service: Plastics;  Laterality: Right;  . INCISION AND DRAINAGE OF WOUND Bilateral 05/05/2015   Procedure: IRRIGATION AND DEBRIDEMENT SACRAL ULCER;  Surgeon: Theodoro Kos, DO;  Location: [place];  Service: Plastics;  Laterality: Bilateral;  . INCISION AND DRAINAGE OF WOUND N/A 08/04/2015   Procedure: IRRIGATION AND DEBRIDEMENT OF SACRAL ULCER AND ;  Surgeon: Loel Lofty Dillingham, DO;  Location: [place];  Service: Plastics;  Laterality: N/A;  . INSERTION OF SUPRAPUBIC CATHETER N/A 10/12/2014   Procedure: INSERTION OF SUPRAPUBIC CATHETER;  Surgeon: Reece Packer, MD;  Location: [place];  Service:  Urology;  Laterality: N/A;  . LAPAROSCOPIC DIVERTED COLOSTOMY N/A 10/12/2014   Procedure: LAPAROSCOPIC DIVERTING COLOSTOMY;  Surgeon: Donnie Mesa, MD;  Location: [place];  Service: General;  Laterality: N/A;  . MINOR APPLICATION OF WOUND VAC N/A 11/25/2014   Procedure:  WOUND VAC CHANGE;  Surgeon: Theodoro Kos, DO;  Location: [place];  Service: Plastics;  Laterality: N/A;  . MULTIPLE EXTRACTIONS WITH ALVEOLOPLASTY N/A 08/03/2014   Procedure: MULTIPLE EXTRACTIONS;  Surgeon: Gae Bon, DDS;  Location: [place];  Service: Oral Surgery;  Laterality: N/A;  . TEE WITHOUT CARDIOVERSION N/A 08/17/2014   Procedure: TRANSESOPHAGEAL ECHOCARDIOGRAM (TEE);  Surgeon: Dorothy Spark, MD;  Location: [place];  Service: Cardiovascular;  Laterality: N/A;  . TEE WITHOUT CARDIOVERSION N/A 05/05/2015   Procedure: TRANSESOPHAGEAL ECHOCARDIOGRAM (TEE);  Surgeon: Lelon Perla, MD;  Location: [facility];  Service: Cardiovascular;  Laterality: N/A;  . TONSILLECTOMY      Family History  Problem Relation Age of Onset  . Diabetes Father   . Hypertension Father   .  Asthma      fhx  . Hypertension      fhx  . Stroke      fhx  . Heart disease Mother       Social History   Social History  . Marital status: Single    Spouse name: N/A  . Number of children: 0  . Years of education: 73   Occupational History  . Disability    Social History Main Topics  . Smoking status: Former Smoker    Packs/day: 1.00    Years: 12.00    Types: Cigars, Cigarettes    Quit date: 09/07/2014  . Smokeless tobacco: Never Used  . Alcohol use No  . Drug use: No  . Sexual activity: Not Currently   Other Topics Concern  . None   Social History Narrative   Patient lives at home with mother father.    Patient has 2 children.    Patient is single.    Patient was left hand but after stroke patient is now right handed.    Patient has  11th grade education.     Allergies  Allergen Reactions  . Amikacin Sulfate  Other (See Comments)    Seizure   . Cefuroxime Axetil Anaphylaxis    Tolerated cefepime 12/2015 - with Pepcid/Solu-Medrol  . Ertapenem Other (See Comments)    Rash and confusion-->tolerated Imipenem   . Morphine And Related Other (See Comments)    Changed mental status, confusion, headache, visual hallucination  . Penicillins Anaphylaxis and Other (See Comments)    Tolerated Imipenem; no reaction to 7 day course of amoxicillin in 2015 Has patient had a PCN reaction causing immediate rash, facial/tongue/throat swelling, SOB or lightheadedness with hypotension: Yes Has patient had a PCN reaction causing severe rash involving mucus membranes or skin necrosis: No Has patient had a PCN reaction that required hospitalization Yes Has patient had a PCN reaction occurring within the last 10 years: No If all of the above answers are "NO", then may proceed with Cephalo  . Sulfa Antibiotics Anaphylaxis, Shortness Of Breath and Other (See Comments)  . Tessalon [Benzonatate] Anaphylaxis  . Shellfish Allergy Itching and Other (See Comments)    Took benadryl to alleviate reaction  . Levaquin [Levofloxacin In D5w] Swelling    Hand and forearm swelling  . Nsaids Itching and Other (See Comments)    Risk of bleeding, itching  . Miripirium Rash and Other (See Comments)    Change in mental status     Current Outpatient Prescriptions:  .  acetaminophen (TYLENOL) 325 MG tablet, Take 2 tablets (650 mg total) by mouth every 6 (six) hours as needed for mild pain, moderate pain, fever or headache., Disp: , Rfl:  .  albuterol (PROVENTIL) (2.5 MG/3ML) 0.083% nebulizer solution, Take 3 mLs (2.5 mg total) by nebulization 4 (four) times daily., Disp: 75 mL, Rfl: 11 .  baclofen (LIORESAL) 20 MG tablet, Take 1 tablet (20 mg total) by mouth 4 (four) times daily., Disp: 120 tablet, Rfl: 1 .  BISACODYL 5 MG EC tablet, Take ONE (1) tablet by mouth twice daily as needed FOR MODERATE CONSTIPATION, Disp: 60 tablet, Rfl: 1 .   buPROPion (WELLBUTRIN XL) 150 MG 24 hr tablet, Take 1 tablet (150 mg total) by mouth daily., Disp: 30 tablet, Rfl: 11 .  camphor-menthol (SARNA) lotion, Apply topically as needed for itching., Disp: 222 mL, Rfl: 0 .  ciprofloxacin (CIPRO) 500 MG tablet, Take 1 tablet (500 mg total) by mouth 2 (two) times  daily., Disp: 14 tablet, Rfl: 0 .  dexlansoprazole (DEXILANT) 60 MG capsule, Take 1 capsule (60 mg total) by mouth daily., Disp: 30 capsule, Rfl: 11 .  docusate (COLACE) 50 MG/5ML liquid, Take 10 mLs (100 mg total) by mouth 2 (two) times daily., Disp: 473 mL, Rfl: 11 .  doxycycline (VIBRA-TABS) 100 MG tablet, Take 1 tablet (100 mg total) by mouth 2 (two) times daily., Disp: 20 tablet, Rfl: 0 .  fentaNYL (DURAGESIC - DOSED MCG/HR) 75 MCG/HR, place 1 patch (57mg total) onto the skin every 3 (three) days, Disp: 10 patch, Rfl: 0 .  ferrous sulfate 325 (65 FE) MG tablet, Take 1 tablet (325 mg total) by mouth daily with breakfast., Disp: 30 tablet, Rfl: 11 .  fluconazole (DIFLUCAN) 200 MG tablet, Take 1 tablet (200 mg total) by mouth daily. (Patient not taking: Reported on 02/27/2016), Disp: 9 tablet, Rfl: 0 .  glucagon (GLUCAGON EMERGENCY) 1 MG injection, INJECT INTO MUSCLE ONCE AS NEEDED (Patient not taking: Reported on 02/27/2016), Disp: 1 kit, Rfl: 0 .  GLUCERNA (GLUCERNA) LIQD, 1,000 mLs by PEG Tube route continuous. 70 ml/hr, Disp: , Rfl:  .  glucose blood (ACCU-CHEK SMARTVIEW) test strip, Use to check blood sugar 4 times per day. Dx Code: E11.9. Appointment needed for further refills., Disp: 200 each, Rfl: 0 .  hydrocortisone (CORTEF) 10 MG tablet, Take 1 tablet (10 mg total) by mouth every evening., Disp: 30 tablet, Rfl: 11 .  hydrocortisone (CORTEF) 20 MG tablet, Take 1 tablet (20 mg total) by mouth every morning., Disp: 30 tablet, Rfl: 11 .  HYDROmorphone (DILAUDID) 8 MG tablet, Take ONE (1) tablet (868mtotal) by mouth every 6 hours as needed for severe pain, Disp: 120 tablet, Rfl: 0 .  insulin  aspart (NOVOLOG FLEXPEN) 100 UNIT/ML FlexPen, Inject 0-10 Units into the skin 4 (four) times daily as needed for high blood sugar (CBG >200). Per sliding scale: CBG <200 0 units, 201-300 4 units, 301-400 6 units, >400 10 units, Disp: , Rfl:  .  insulin glargine (LANTUS) 100 UNIT/ML injection, Inject 0.15 mLs (15 Units total) into the skin at bedtime., Disp: 10 mL, Rfl: 11 .  loratadine (CLARITIN) 10 MG tablet, Take 1 tablet (10 mg total) by mouth daily., Disp: 30 tablet, Rfl: 11 .  LORazepam (ATIVAN) 0.5 MG tablet, Take 0.5 mg by mouth 3 (three) times daily as needed for anxiety. Reported on 11/02/2015, Disp: , Rfl:  .  LYRICA 100 MG capsule, Take ONE (1)  capsule by mouth three times daily (morning,afternoon and evening) and 2 capsules at bedtime, Disp: 150 capsule, Rfl: 5 .  Magnesium Oxide 400 MG CAPS, Take 1 capsule (400 mg total) by mouth 2 (two) times daily., Disp: 60 capsule, Rfl: 11 .  midodrine (PROAMATINE) 10 MG tablet, Take 2 tablets (20 mg total) by mouth 3 (three) times daily with meals., Disp: 180 tablet, Rfl: 6 .  midodrine (PROAMATINE) 5 MG tablet, TAKE FOUR (4) TABLETS by mouth three times daily, Disp: 360 tablet, Rfl: 3 .  Multiple Vitamin (MULTIVITAMIN WITH MINERALS) TABS tablet, Place 1 tablet into feeding tube daily. (Patient taking differently: 1 tablet by PEG Tube route daily. ), Disp: , Rfl:  .  mupirocin ointment (BACTROBAN) 2 %, Apply to affected area twice a day as needed, Disp: 22 g, Rfl: 11 .  ondansetron (ZOFRAN ODT) 8 MG disintegrating tablet, Place 1 tablet (8 mg total) into feeding tube every 8 (eight) hours as needed for nausea or vomiting. (Patient not  taking: Reported on 02/27/2016), Disp: 60 tablet, Rfl: 5 .  Oxycodone HCl 20 MG TABS, Take 1 tablet (20 mg total) by mouth every 6 (six) hours as needed (pain)., Disp: 120 tablet, Rfl: 0 .  pantoprazole sodium (PROTONIX) 40 mg/20 mL PACK, Place 20 mLs (40 mg total) into feeding tube daily. (Patient taking differently: 40 mg  by PEG Tube route daily. ), Disp: 30 each, Rfl: 6 .  saccharomyces boulardii (FLORASTOR) 250 MG capsule, Take 1 capsule (250 mg total) by mouth 2 (two) times daily. (Patient not taking: Reported on 02/27/2016), Disp: 60 capsule, Rfl: 0 .  SANTYL ointment, Apply 1 application topically every Monday, Wednesday, and Friday. Apply to necrotic tissue in the sacral wound only., Disp: 90 g, Rfl: 3 .  SURE COMFORT INS SYR 1CC/29G 29G X 1/2" 1 ML MISC, Use As Directed (Patient not taking: Reported on 02/27/2016), Disp: 100 each, Rfl: 3 .  SURE COMFORT PEN NEEDLES 29G X 12.7MM MISC, Use As Directed, Disp: 100 each, Rfl: 1 .  VOLTAREN 1 % GEL, apply 2 grams topically twice a day, Disp: 100 g, Rfl: 5 .  Water For Irrigation, Sterile (FREE WATER) SOLN, Place 200 mLs into feeding tube every 8 (eight) hours., Disp: 10000 mL, Rfl: 5    Review of Systems  Constitutional: Positive for fever. Negative for activity change, appetite change, chills and diaphoresis.  HENT: Negative for congestion, sore throat and trouble swallowing.   Respiratory: Negative for cough, shortness of breath, wheezing and stridor.   Cardiovascular: Negative for chest pain, palpitations and leg swelling.  Gastrointestinal: Negative for abdominal distention.  Genitourinary: Negative for difficulty urinating, dysuria, flank pain and hematuria.  Musculoskeletal: Negative for arthralgias and back pain.  Skin: Positive for wound.  Neurological: Negative for dizziness.  Hematological: Negative for adenopathy.  Psychiatric/Behavioral: Negative for agitation, behavioral problems, confusion, decreased concentration, dysphoric mood and sleep disturbance.       Objective:   Physical Exam  HENT:  Head: Normocephalic and atraumatic.  Eyes: Conjunctivae and EOM are normal.  Neck: Normal range of motion. Neck supple.  Cardiovascular: Normal rate, regular rhythm and normal heart sounds.  Exam reveals no gallop and no friction rub.   No murmur  heard. Pulmonary/Chest: Effort normal. No respiratory distress. He has decreased breath sounds in the right lower field and the left lower field. He has no wheezes. He has no rales.  Abdominal: Soft. He exhibits no distension.  Neurological: He is alert.  quadraplegic   Skin: Skin is warm and dry.  See pictures  Psychiatric: He has a normal mood and affect. His speech is normal and behavior is normal. Thought content normal.  Nursing note and vitals reviewed.     Sacral ulcers July 2016 pictures:       September 11th, 2017  Pictures: with exposed bone again. There was no frank purulence in the wound         Foot ulcers:  06/18/16:                Assessment & Plan:   30 yo with paraplegia, sacral decubitus ulcer, chronic pelvic osteo, recurrent UTIs, recurrent admissions for sepsis now admission with MRSA and microaerophillic strep bacteremia, pelvic osteomyelitis VDRF sp tracheostomy, VAP with Acinetobacter now off ventilator with tracheostomy in place but doing well from resp status but with persistent decubitus ulcers and chronic osteomyelitis with questionable development of acute osteomyelitis on top of chronic osteomyelitis given recent fevers and also bone visible in wound  bed is wound itself does not show obvious purulence when I examined it today in clinic. He also has ulcers on his feet that do not appear acutely infected.   Will check a sedimentation rate C-reactive protein; present metabolic panel CBC with differential and an MRI with contrast of the pelvis  I'll refer him back to seeing Dr. Marla Roe as well. We will consider need for deep bone biopsy to help guide targetted therapy should he have evidence for acute osteomyelitis. Records from wound care would be helpful.   #2Tracheitis: sp doxycyline  #3 Recurrent UTI's : not had recent recurrence. Of course has chronic colonization   #4 mx allergies with confusion  while on a carbapenem  I  spent greater than 40 minutes with the patient including greater than 50% of time in face to face counsel of the patient re his fevers, mx allergie, chronic decubitus ulcers osteomyelitis, bacteremias and in coordination of his care.

## 2016-06-19 ENCOUNTER — Telehealth: Payer: Self-pay | Admitting: *Deleted

## 2016-06-19 ENCOUNTER — Telehealth: Payer: Self-pay | Admitting: Family Medicine

## 2016-06-19 LAB — C-REACTIVE PROTEIN: CRP: 65.7 mg/L — AB (ref <8.0)

## 2016-06-19 LAB — SEDIMENTATION RATE: Sed Rate: 64 mm/hr — ABNORMAL HIGH (ref 0–15)

## 2016-06-19 NOTE — Telephone Encounter (Signed)
[name] called regarding MRI order.  Patient has special needs, they would prefer that he have the imaging done at either [facility] or [facility].  1- patient needs sedation to control his tremors/spasms for the study, 2- patient is on a trach and will desat when lying flat. He needs O2 and monitoring with potential suction, 3- patient has an IVC filter, 4- patient is in a wheelchair and needs maximum assist/lift, 5- patient has a catheter, a colostomy and feeding tube.  RN cancelled the order in place at [facility].  Please place new order for MRI with full sedation at [facility] if this is appropriate. [name], RN

## 2016-06-19 NOTE — Telephone Encounter (Signed)
Pamala Hurry calling from [facility] state that Dr. Sarajane Jews needs to submit a PA to [facility] for pts high dose of opioids.  This is something new that Medicaid has started at the end of Aug.

## 2016-06-19 NOTE — Telephone Encounter (Signed)
Spoke with mother today regarding questions. States they are waiting for Phyntonil,Lorazepam and Dilaudid to be authorized before patient can start. States patient frequently gets UTI's and mother would like to know if he can have something prescribed for UTI.

## 2016-06-19 NOTE — Telephone Encounter (Signed)
° °  Pt Dad call and would like a call concerning [name] pain medication   [phone]

## 2016-06-19 NOTE — Telephone Encounter (Signed)
For the UTI, call in Macrobid 100 mg bid for 7 days. For the Lorazepam call in #[phone] with 5 rf. It is TOO early to refill either Dilaudid or Fentanyl

## 2016-06-19 NOTE — Telephone Encounter (Signed)
Jason [name] is there anything special we need to do from resp standpoint or if I order with anesthesia will this be sufficient?  He has climbing ESR< CRP, WBC< fevers and his ulcers of course are not healing and he has (no surprise) exposed bone

## 2016-06-20 MED ORDER — LORAZEPAM 0.5 MG PO TABS: 0.5000 mg | ORAL_TABLET | Freq: Three times a day (TID) | ORAL | 5 refills | Status: AC | PRN

## 2016-06-20 MED ORDER — NITROFURANTOIN MONOHYD MACRO 100 MG PO CAPS: 100.0000 mg | ORAL_CAPSULE | Freq: Two times a day (BID) | ORAL | 0 refills | Status: DC

## 2016-06-20 NOTE — Telephone Encounter (Signed)
All PA's have been submitted. Oxycodone was approved and pharmacy is already aware. PA's submitted through form that Medicaid requires for C II's. Awaiting response.

## 2016-06-20 NOTE — Telephone Encounter (Signed)
PA approved & pharmacy aware.

## 2016-06-20 NOTE — Telephone Encounter (Signed)
I spoke with pt's mom, scripts to [facility]. A prior approval request was made for Dilaudid & Fentanyl.

## 2016-06-21 ENCOUNTER — Telehealth: Payer: Self-pay | Admitting: Family Medicine

## 2016-06-21 NOTE — Telephone Encounter (Signed)
Pharmacy called to let us know that the Oxycodone was still not going through. Called and spoke with Medicaid and a box had not been checked. They will have it fixed by no later than 11pm tonight. Pharmacy informed.

## 2016-06-22 NOTE — Telephone Encounter (Signed)
Pt's mom will call back 3 business days before needed.

## 2016-06-22 NOTE — Telephone Encounter (Signed)
Pharmacy called to advise the  HYDROmorphone (DILAUDID) 8 MG tablet  still needs a prior auth when she rand through today.[facility] too early per dr fry. Maybe that is the reason it did not go through.

## 2016-06-25 ENCOUNTER — Other Ambulatory Visit: Payer: Self-pay | Admitting: *Deleted

## 2016-06-25 MED ORDER — FENTANYL 75 MCG/HR TD PT72: MEDICATED_PATCH | TRANSDERMAL | 0 refills | Status: DC

## 2016-06-25 NOTE — Telephone Encounter (Signed)
done

## 2016-06-25 NOTE — Telephone Encounter (Signed)
Script is ready for pick up here at front office and I spoke with mom.

## 2016-06-25 NOTE — Telephone Encounter (Signed)
Refill request for Fentanyl patch.

## 2016-06-25 NOTE — Telephone Encounter (Signed)
[name] last refill was in July 2018l

## 2016-06-25 NOTE — Addendum Note (Signed)
Addended by: [name] A on: 06/25/2016 03:44 PM   Modules accepted: Orders

## 2016-06-25 NOTE — Telephone Encounter (Signed)
THanks Michelle! 

## 2016-06-25 NOTE — Telephone Encounter (Signed)
Scheduled for 9/28 at [facility] - radiology will call patient with time and instructions. Will follow with authorization.

## 2016-06-26 ENCOUNTER — Other Ambulatory Visit: Payer: Self-pay | Admitting: Family Medicine

## 2016-06-26 DIAGNOSIS — L89159 Pressure ulcer of sacral region, unspecified stage: Secondary | ICD-10-CM

## 2016-06-26 DIAGNOSIS — M869 Osteomyelitis, unspecified: Secondary | ICD-10-CM

## 2016-06-28 ENCOUNTER — Telehealth: Payer: Self-pay | Admitting: Family Medicine

## 2016-06-28 ENCOUNTER — Telehealth: Payer: Self-pay | Admitting: Internal Medicine

## 2016-06-28 NOTE — Telephone Encounter (Signed)
Returned call. Discussed that tube was placed in the hospital. Phone number given for Interventional Radiology to call regarding G tube issues.

## 2016-06-28 NOTE — Telephone Encounter (Signed)
Interventional Radiology needs a order for a new G-tube for the pt. Radiology # is [phone].  Pts nurse state that it needs to be replaced due to it being out 2-3 inches from where it normally was placed.

## 2016-06-29 NOTE — Telephone Encounter (Signed)
Per Dr fry this order should come from GI. I spoke with Jeneen Rinks home nurse and he will contact that office for order.

## 2016-07-02 ENCOUNTER — Telehealth: Payer: Self-pay | Admitting: Family Medicine

## 2016-07-02 ENCOUNTER — Other Ambulatory Visit: Payer: Self-pay | Admitting: Family Medicine

## 2016-07-02 DIAGNOSIS — T85598D Other mechanical complication of other gastrointestinal prosthetic devices, implants and grafts, subsequent encounter: Secondary | ICD-10-CM

## 2016-07-02 NOTE — Telephone Encounter (Signed)
° °  Jeneen Rinks from [facility] who is pt nurse call to say that pt need to have his G TUBE change and since it was done in the hospital everyone is referring him back to the pcp. Jeneen Rinks is asking for a referral to a GI . [phone]

## 2016-07-03 NOTE — Telephone Encounter (Signed)
Ok to refill 

## 2016-07-03 NOTE — Telephone Encounter (Signed)
I spoke with Jason [name] at [place] [facility] and went over below message.

## 2016-07-03 NOTE — Telephone Encounter (Signed)
I did the referral to GI °

## 2016-07-04 ENCOUNTER — Other Ambulatory Visit: Payer: Self-pay

## 2016-07-04 ENCOUNTER — Encounter (HOSPITAL_COMMUNITY)
Admission: RE | Admit: 2016-07-04 | Discharge: 2016-07-04 | Disposition: A | Discharge status: Home / Self Care | Payer: Medicaid Other | Source: Ambulatory Visit | Attending: Infectious Disease | Admitting: Infectious Disease

## 2016-07-04 ENCOUNTER — Encounter (HOSPITAL_COMMUNITY): Payer: Self-pay

## 2016-07-04 ENCOUNTER — Other Ambulatory Visit (HOSPITAL_COMMUNITY): Payer: Self-pay | Admitting: *Deleted

## 2016-07-04 ENCOUNTER — Telehealth: Payer: Self-pay

## 2016-07-04 ENCOUNTER — Telehealth: Payer: Self-pay | Admitting: *Deleted

## 2016-07-04 DIAGNOSIS — E1040 Type 1 diabetes mellitus with diabetic neuropathy, unspecified: Secondary | ICD-10-CM | POA: Insufficient documentation

## 2016-07-04 DIAGNOSIS — Z79899 Other long term (current) drug therapy: Secondary | ICD-10-CM | POA: Insufficient documentation

## 2016-07-04 DIAGNOSIS — Z930 Tracheostomy status: Secondary | ICD-10-CM | POA: Insufficient documentation

## 2016-07-04 DIAGNOSIS — M8668 Other chronic osteomyelitis, other site: Secondary | ICD-10-CM | POA: Insufficient documentation

## 2016-07-04 DIAGNOSIS — B9562 Methicillin resistant Staphylococcus aureus infection as the cause of diseases classified elsewhere: Secondary | ICD-10-CM | POA: Insufficient documentation

## 2016-07-04 DIAGNOSIS — Z9889 Other specified postprocedural states: Secondary | ICD-10-CM | POA: Diagnosis not present

## 2016-07-04 DIAGNOSIS — G8220 Paraplegia, unspecified: Secondary | ICD-10-CM | POA: Diagnosis not present

## 2016-07-04 DIAGNOSIS — Z794 Long term (current) use of insulin: Secondary | ICD-10-CM | POA: Insufficient documentation

## 2016-07-04 DIAGNOSIS — J45909 Unspecified asthma, uncomplicated: Secondary | ICD-10-CM | POA: Insufficient documentation

## 2016-07-04 DIAGNOSIS — Z880 Allergy status to penicillin: Secondary | ICD-10-CM | POA: Insufficient documentation

## 2016-07-04 DIAGNOSIS — N390 Urinary tract infection, site not specified: Secondary | ICD-10-CM | POA: Diagnosis not present

## 2016-07-04 DIAGNOSIS — K9423 Gastrostomy malfunction: Secondary | ICD-10-CM

## 2016-07-04 DIAGNOSIS — Z01812 Encounter for preprocedural laboratory examination: Secondary | ICD-10-CM | POA: Diagnosis present

## 2016-07-04 DIAGNOSIS — Z87891 Personal history of nicotine dependence: Secondary | ICD-10-CM | POA: Insufficient documentation

## 2016-07-04 DIAGNOSIS — K219 Gastro-esophageal reflux disease without esophagitis: Secondary | ICD-10-CM | POA: Diagnosis not present

## 2016-07-04 DIAGNOSIS — Z888 Allergy status to other drugs, medicaments and biological substances status: Secondary | ICD-10-CM | POA: Diagnosis not present

## 2016-07-04 DIAGNOSIS — L89159 Pressure ulcer of sacral region, unspecified stage: Secondary | ICD-10-CM | POA: Diagnosis not present

## 2016-07-04 HISTORY — DX: Type 2 diabetes mellitus with unspecified diabetic retinopathy without macular edema: E11.319

## 2016-07-04 HISTORY — DX: Colostomy status: Z93.3

## 2016-07-04 HISTORY — DX: Major depressive disorder, single episode, unspecified: F32.9

## 2016-07-04 HISTORY — DX: Anxiety disorder, unspecified: F41.9

## 2016-07-04 HISTORY — DX: Other complications of anesthesia, initial encounter: T88.59XA

## 2016-07-04 HISTORY — DX: Anemia, unspecified: D64.9

## 2016-07-04 HISTORY — DX: Personal history of other venous thrombosis and embolism: Z86.718

## 2016-07-04 HISTORY — DX: Tracheostomy status: Z93.0

## 2016-07-04 HISTORY — DX: Adverse effect of unspecified anesthetic, initial encounter: T41.45XA

## 2016-07-04 HISTORY — DX: Personal history of other diseases of the respiratory system: Z87.09

## 2016-07-04 HISTORY — DX: Personal history of other diseases of the digestive system: Z87.19

## 2016-07-04 HISTORY — DX: Depression, unspecified: F32.A

## 2016-07-04 LAB — CBC
HCT: 31.9 % — ABNORMAL LOW (ref 39.0–52.0)
HEMOGLOBIN: 9.8 g/dL — AB (ref 13.0–17.0)
MCH: 25.5 pg — AB (ref 26.0–34.0)
MCHC: 30.7 g/dL (ref 30.0–36.0)
MCV: 83.1 fL (ref 78.0–100.0)
Platelets: 581 10*3/uL — ABNORMAL HIGH (ref 150–400)
RBC: 3.84 MIL/uL — AB (ref 4.22–5.81)
RDW: 15.8 % — ABNORMAL HIGH (ref 11.5–15.5)
WBC: 25.6 10*3/uL — ABNORMAL HIGH (ref 4.0–10.5)

## 2016-07-04 LAB — BASIC METABOLIC PANEL
ANION GAP: 12 (ref 5–15)
BUN: 10 mg/dL (ref 6–20)
CHLORIDE: 106 mmol/L (ref 101–111)
CO2: 18 mmol/L — AB (ref 22–32)
CREATININE: 0.64 mg/dL (ref 0.61–1.24)
Calcium: 8.9 mg/dL (ref 8.9–10.3)
GFR calc non Af Amer: >60 mL/min (ref >60)
Glucose, Bld: 187 mg/dL — ABNORMAL HIGH (ref 65–99)
Potassium: 4.2 mmol/L (ref 3.5–5.1)
Sodium: 136 mmol/L (ref 135–145)

## 2016-07-04 LAB — GLUCOSE, CAPILLARY: Glucose-Capillary: 220 mg/dL — ABNORMAL HIGH (ref 65–99)

## 2016-07-04 NOTE — Telephone Encounter (Signed)
Order placed in epic for g tube replacement. IR to contact pt regarding appt.

## 2016-07-04 NOTE — Telephone Encounter (Signed)
I have not evaluated those conditions and IF he needs all of this then he may need to be hospitalized to facilitate all of these. I

## 2016-07-04 NOTE — Telephone Encounter (Signed)
[name] notified. Myrtis Hopping CMA

## 2016-07-04 NOTE — Progress Notes (Signed)
Left message on Dr. Barbie Banner nurse voicemail informing them of elevate WBC.

## 2016-07-04 NOTE — Telephone Encounter (Signed)
Thanks

## 2016-07-04 NOTE — Telephone Encounter (Signed)
ok 

## 2016-07-04 NOTE — Progress Notes (Signed)
Dr. Lucianne Lei Dam's office notified of elevated WBC.

## 2016-07-04 NOTE — Progress Notes (Signed)
Saw pt with mom for pre-admission testing. Pt and mom denies cardiac history. Pt is diabetic, type 1. Pt sees Dr. Loanne Drilling for his diabetes. Last A1C was 7.4 in April, 2017. Pt's mom states pt's fasting blood sugar usually runs between 118 - 120. Pt very sleepy at appt. Mom states he states up very late at night and sleeps a lot during the day.

## 2016-07-04 NOTE — Pre-Procedure Instructions (Signed)
    Jason [name]  07/04/2016    Your procedure is scheduled on Thursday, July 05, 2016 at 8:00 AM.   Report to [facility] Entrance "A" Admitting Office at 6:00 AM.   Call this number if you have problems the morning of surgery: [phone]    Remember:  Do not eat food or drink liquids after midnight tonight.  Take these medicines the morning of surgery with A SIP OF WATER: Bupropion (Wellbutrin), Dexlansoprazole (Dexilant), Hydrocortisone (Cortef), Pregabalin (Lyrica), Midodrine (Proamatine), Oxycodone - if needed, Albuterol nebulizer   How to Manage Your Diabetes Before Surgery   Why is it important to control my blood sugar before and after surgery?   Improving blood sugar levels before and after surgery helps healing and can limit problems.  A way of improving blood sugar control is eating a healthy diet by:  - Eating less sugar and carbohydrates  - Increasing activity/exercise  - Talk with your doctor about reaching your blood sugar goals  High blood sugars (greater than 180 mg/dL) can raise your risk of infections and slow down your recovery so you will need to focus on controlling your diabetes during the weeks before surgery.  Make sure that the doctor who takes care of your diabetes knows about your planned surgery including the date and location.  How do I manage my blood sugars before surgery?   Check your blood sugar at least 4 times a day today before surgery to make sure that they are not too high or low.  Check your blood sugar the morning of your surgery when you wake up and every 2 hours until you get to the Short-Stay unit.  Treat a low blood sugar (less than 70 mg/dL) with 1/2 cup of clear juice (cranberry or apple), 4 glucose tablets, OR glucose gel.  Recheck blood sugar in 15 minutes after treatment (to make sure it is greater than 70 mg/dL).  If blood sugar is not greater than 70 mg/dL on re-check, call [phone] for further  instructions.   Report your blood sugar to the Short-Stay nurse when you get to Short-Stay.  References:  [facility], 2007 "How to Manage your Diabetes Before and After Surgery".  What do I do about my diabetes medications?   THE NIGHT BEFORE SURGERY, take 7 units of Lantus Insulin.  In the morning, if blood sugar is 220 or greater, take 1/2 of regular sliding scale dose of Novolog insulin.   Do not wear jewelry.  Do not wear lotions, powders, or cologne.  Men may shave face and neck.  Do not bring valuables to the hospital.  [facility] is not responsible for any belongings or valuables.  Contacts, dentures or bridgework may not be worn into surgery.  Leave your suitcase in the car.  After surgery it may be brought to your room.  For patients admitted to the hospital, discharge time will be determined by your treatment team.  Patients discharged the day of surgery will not be allowed to drive home.   Please read over the following fact sheets that you were given. Coughing and Deep Breathing

## 2016-07-04 NOTE — Telephone Encounter (Signed)
[name] called to request that additional MRIs are ordered for patient. He is having an MRI of his pelvis tomorrow and she said he is having pain in his back and right shoulder. Advised that the MRI for the pelvis had to be prior authorized through [facility] and that can sometimes take several days. Also explained that Dr. Tommy Medal is seeing patient for his wounds and checking on infection. She said the patient has to be sedated for the MRI.

## 2016-07-04 NOTE — Telephone Encounter (Signed)
Pt has G tube in place that needs to be replaced. PCP wants GI to give order to IR to have tube replaced. Please advise if ok to give replacement order.

## 2016-07-05 ENCOUNTER — Ambulatory Visit (HOSPITAL_COMMUNITY)
Admission: RE | Admit: 2016-07-05 | Discharge: 2016-07-05 | Disposition: A | Discharge status: Home / Self Care | Payer: Medicaid Other | Source: Ambulatory Visit | Attending: Infectious Disease | Admitting: Infectious Disease

## 2016-07-05 ENCOUNTER — Ambulatory Visit (HOSPITAL_COMMUNITY): Payer: Medicaid Other | Admitting: Certified Registered Nurse Anesthetist

## 2016-07-05 ENCOUNTER — Encounter (HOSPITAL_COMMUNITY): Payer: Self-pay

## 2016-07-05 ENCOUNTER — Encounter (HOSPITAL_COMMUNITY): Payer: Self-pay | Admitting: Urology

## 2016-07-05 ENCOUNTER — Encounter (HOSPITAL_COMMUNITY)
Admission: RE | Disposition: A | Discharge status: Home / Self Care | Payer: Self-pay | Source: Ambulatory Visit | Attending: Infectious Disease

## 2016-07-05 ENCOUNTER — Ambulatory Visit (HOSPITAL_COMMUNITY): Payer: Medicaid Other | Admitting: Emergency Medicine

## 2016-07-05 DIAGNOSIS — M797 Fibromyalgia: Secondary | ICD-10-CM | POA: Diagnosis not present

## 2016-07-05 DIAGNOSIS — E11319 Type 2 diabetes mellitus with unspecified diabetic retinopathy without macular edema: Secondary | ICD-10-CM | POA: Insufficient documentation

## 2016-07-05 DIAGNOSIS — Z8673 Personal history of transient ischemic attack (TIA), and cerebral infarction without residual deficits: Secondary | ICD-10-CM | POA: Insufficient documentation

## 2016-07-05 DIAGNOSIS — Z9889 Other specified postprocedural states: Secondary | ICD-10-CM | POA: Insufficient documentation

## 2016-07-05 DIAGNOSIS — Z794 Long term (current) use of insulin: Secondary | ICD-10-CM | POA: Insufficient documentation

## 2016-07-05 DIAGNOSIS — K219 Gastro-esophageal reflux disease without esophagitis: Secondary | ICD-10-CM | POA: Diagnosis not present

## 2016-07-05 DIAGNOSIS — Z888 Allergy status to other drugs, medicaments and biological substances status: Secondary | ICD-10-CM | POA: Diagnosis not present

## 2016-07-05 DIAGNOSIS — Z930 Tracheostomy status: Secondary | ICD-10-CM | POA: Insufficient documentation

## 2016-07-05 DIAGNOSIS — M86651 Other chronic osteomyelitis, right thigh: Secondary | ICD-10-CM | POA: Insufficient documentation

## 2016-07-05 DIAGNOSIS — F329 Major depressive disorder, single episode, unspecified: Secondary | ICD-10-CM | POA: Insufficient documentation

## 2016-07-05 DIAGNOSIS — Z87440 Personal history of urinary (tract) infections: Secondary | ICD-10-CM | POA: Insufficient documentation

## 2016-07-05 DIAGNOSIS — L89159 Pressure ulcer of sacral region, unspecified stage: Secondary | ICD-10-CM | POA: Diagnosis not present

## 2016-07-05 DIAGNOSIS — M1990 Unspecified osteoarthritis, unspecified site: Secondary | ICD-10-CM | POA: Insufficient documentation

## 2016-07-05 DIAGNOSIS — E1143 Type 2 diabetes mellitus with diabetic autonomic (poly)neuropathy: Secondary | ICD-10-CM | POA: Diagnosis not present

## 2016-07-05 DIAGNOSIS — Z880 Allergy status to penicillin: Secondary | ICD-10-CM | POA: Diagnosis not present

## 2016-07-05 DIAGNOSIS — Z87891 Personal history of nicotine dependence: Secondary | ICD-10-CM | POA: Insufficient documentation

## 2016-07-05 DIAGNOSIS — Z933 Colostomy status: Secondary | ICD-10-CM | POA: Diagnosis not present

## 2016-07-05 DIAGNOSIS — J45909 Unspecified asthma, uncomplicated: Secondary | ICD-10-CM | POA: Diagnosis not present

## 2016-07-05 HISTORY — PX: RADIOLOGY WITH ANESTHESIA: SHX6223

## 2016-07-05 LAB — GLUCOSE, CAPILLARY
GLUCOSE-CAPILLARY: 123 mg/dL — AB (ref 65–99)
GLUCOSE-CAPILLARY: 115 mg/dL — AB (ref 65–99)
Glucose-Capillary: 65 mg/dL (ref 65–99)

## 2016-07-05 LAB — HEMOGLOBIN A1C
Hgb A1c MFr Bld: 8 % — ABNORMAL HIGH (ref 4.8–5.6)
Mean Plasma Glucose: 183 mg/dL

## 2016-07-05 SURGERY — RADIOLOGY WITH ANESTHESIA
Anesthesia: General

## 2016-07-05 MED ORDER — DEXTROSE 50 % IV SOLN
INTRAVENOUS | Status: AC
  Filled 2016-07-05: qty 50

## 2016-07-05 MED ORDER — LACTATED RINGERS IV SOLN
INTRAVENOUS | Status: DC
  Administered 2016-07-05: 08:00:00 via INTRAVENOUS

## 2016-07-05 MED ORDER — GADOBENATE DIMEGLUMINE 529 MG/ML IV SOLN
10.0000 mL | Freq: Once | INTRAVENOUS | Status: AC | PRN
  Administered 2016-07-05: 9 mL via INTRAVENOUS

## 2016-07-05 MED ORDER — DEXTROSE 50 % IV SOLN
25.0000 mL | Freq: Once | INTRAVENOUS | Status: AC
  Administered 2016-07-05: 25 mL via INTRAVENOUS

## 2016-07-05 NOTE — Progress Notes (Signed)
CBG=63. Dr.Moser notified. 1/2 amp D50 ordered. Will give and recheck CBG in 74min.

## 2016-07-05 NOTE — Transfer of Care (Signed)
Immediate Anesthesia Transfer of Care Note  Patient: Jason [name]  Procedure(s) Performed: Procedure(s): RADIOLOGY WITH ANESTHESIA MRI OF PELVIS WITH AND WITHOUT (N/A)  Patient Location: PACU  Anesthesia Type:General  Level of Consciousness: oriented, sedated and patient cooperative  Airway & Oxygen Therapy: Patient Spontanous Breathing  Post-op Assessment: Report given to RN and Post -op Vital signs reviewed and stable  Post vital signs: Reviewed and stable  Last Vitals:  Vitals:   07/05/16 0733 07/05/16 0950  BP:  (P) 111/80  Pulse: 77   Resp:    Temp:  (P) 36.6 C    Last Pain:  Vitals:   07/05/16 0726  TempSrc: Oral  PainSc:          Complications: No apparent anesthesia complications

## 2016-07-05 NOTE — H&P (Signed)
[facility] for Infectious Disease   Please see my last note re Mr Befort. I did not examine him today which is the day of his MRI with anesthesia but during my last clinic note.   The purpose of this MRI with anesthesia is to determine extend of pelvic osteomyelitis Mr Radek may have  Past Medical History:  Diagnosis Date  . Anemia   . Anxiety   . Arthritis   . Asthma   . 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 Colostomy in place [facility])   . Complication of anesthesia    difficulty waking up, will have sudden drop in BP and O2 sats  . Depression   . Diabetic neuropathy ([place])   . Diabetic retinopathy ([place])   . Dysrhythmia    usually due to medications  . Family history of anesthesia complication    Pt mother can't have epidural procedures  . Fibromyalgia   . Gastroparesis   . GERD (gastroesophageal reflux disease)   . H/O constipation   . H/O respiratory failure   . H/O thrombosis   . Hx MRSA infection    on face  . Multiple allergies 11/02/2015  . Pneumonia   . Recurrent UTI 11/02/2015  . Seizures ([place]) 2017   one time during admission in hosptital  . 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 Syncope 02/16/2015  . 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 Type I diabetes mellitus ([place])    sees Dr. Loanne Drilling     Past Surgical History:  Procedure Laterality Date  . APPLICATION OF A-CELL OF BACK N/A 11/12/2014   Procedure: APPLICATION A CELL AND VAC ;  Surgeon: Theodoro Kos, DO;  Location: [place];  Service: Plastics;  Laterality: N/A;  . APPLICATION OF A-CELL OF BACK N/A 12/08/2014   Procedure: APPLICATION OF A-CELL AND WOUND VAC ;  Surgeon: Theodoro Kos, DO;  Location: [place];  Service: Plastics;  Laterality: N/A;  . APPLICATION OF A-CELL OF CHEST/ABDOMEN N/A 08/04/2015   Procedure: APPLICATION OF A-CELL ;  Surgeon: Loel Lofty Dillingham, DO;  Location: [place];  Service: Plastics;  Laterality: N/A;  . COLOSTOMY    . DEBRIDMENT  OF DECUBITUS ULCER N/A 10/04/2014   Procedure: DEBRIDMENT OF DECUBITUS ULCER;  Surgeon: Georganna Skeans, MD;  Location: [place];  Service: General;  Laterality: N/A;  . GASTROSTOMY TUBE PLACEMENT    . HEMATOMA EVACUATION N/A 05/05/2015   Procedure: EVACUATION HEMATOMA bedside procedure;  Surgeon: Theodoro Kos, DO;  Location: [place];  Service: Plastics;  Laterality: N/A;  . INCISION AND DRAINAGE OF WOUND N/A 11/12/2014   Procedure: IRRIGATION AND DEBRIDEMENT OF WOUNDS WITH BONE BIOPSY AND SURGICAL PREP ;  Surgeon: Theodoro Kos, DO;  Location: [place];  Service: Plastics;  Laterality: N/A;  . INCISION AND DRAINAGE OF WOUND N/A 11/18/2014   Procedure: IRRIGATION AND DEBRIDEMENT OF SACRAL ULCER ONLY WITH PLACEMENT OF A CELL AND VAC/ DRESSING CHANGE TO UPPER BACK AREA.;  Surgeon: Theodoro Kos, DO;  Location: [place];  Service: Plastics;  Laterality: N/A;  . INCISION AND DRAINAGE OF WOUND N/A 11/25/2014   Procedure: IRRIGATION AND DEBRIDEMENT OF SACRAL ULCER AND BACK BURN WITH PLACEMENT OF A-CELL;  Surgeon: Theodoro Kos, DO;  Location: [place];  Service: Plastics;  Laterality: N/A;  . INCISION AND DRAINAGE OF WOUND Right 12/08/2014   Procedure: IRRIGATION AND DEBRIDEMENT SACRAL WOUND AND RIGHT ISCHIAL WOUND ;  Surgeon: Theodoro Kos, DO;  Location:  [place] [facility];  Service: Plastics;  Laterality: Right;  . INCISION AND DRAINAGE OF WOUND Bilateral 05/05/2015   Procedure: IRRIGATION AND DEBRIDEMENT SACRAL ULCER;  Surgeon: Theodoro Kos, DO;  Location: [place];  Service: Plastics;  Laterality: Bilateral;  . INCISION AND DRAINAGE OF WOUND N/A 08/04/2015   Procedure: IRRIGATION AND DEBRIDEMENT OF SACRAL ULCER AND ;  Surgeon: Loel Lofty Dillingham, DO;  Location: [place];  Service: Plastics;  Laterality: N/A;  . INSERTION OF SUPRAPUBIC CATHETER N/A 10/12/2014   Procedure: INSERTION OF SUPRAPUBIC CATHETER;  Surgeon: Reece Packer, MD;  Location: [place];  Service: Urology;  Laterality: N/A;  . LAPAROSCOPIC DIVERTED COLOSTOMY N/A 10/12/2014    Procedure: LAPAROSCOPIC DIVERTING COLOSTOMY;  Surgeon: Donnie Mesa, MD;  Location: [place];  Service: General;  Laterality: N/A;  . MINOR APPLICATION OF WOUND VAC N/A 11/25/2014   Procedure:  WOUND VAC CHANGE;  Surgeon: Theodoro Kos, DO;  Location: [place];  Service: Plastics;  Laterality: N/A;  . MULTIPLE EXTRACTIONS WITH ALVEOLOPLASTY N/A 08/03/2014   Procedure: MULTIPLE EXTRACTIONS;  Surgeon: Gae Bon, DDS;  Location: [place];  Service: Oral Surgery;  Laterality: N/A;  . TEE WITHOUT CARDIOVERSION N/A 08/17/2014   Procedure: TRANSESOPHAGEAL ECHOCARDIOGRAM (TEE);  Surgeon: Dorothy Spark, MD;  Location: [place];  Service: Cardiovascular;  Laterality: N/A;  . TEE WITHOUT CARDIOVERSION N/A 05/05/2015   Procedure: TRANSESOPHAGEAL ECHOCARDIOGRAM (TEE);  Surgeon: Lelon Perla, MD;  Location: [facility] ENDOSCOPY;  Service: Cardiovascular;  Laterality: N/A;  . TONSILLECTOMY      Family History  Problem Relation Age of Onset  . Diabetes Father   . Hypertension Father   . Heart disease Mother     congenital - MVP  . Asthma      fhx  . Hypertension      fhx  . Stroke      fhx      Social History   Social History  . Marital status: Single    Spouse name: N/A  . Number of children: 0  . Years of education: 27   Occupational History  . Disability    Social History Main Topics  . Smoking status: Former Smoker    Packs/day: 1.00    Years: 12.00    Types: Cigars, Cigarettes    Quit date: 09/07/2014  . Smokeless tobacco: Never Used  . Alcohol use No  . Drug use: No  . Sexual activity: Not Currently   Other Topics Concern  . None   Social History Narrative   Patient lives at home with mother father.    Patient has 2 children.    Patient is single.    Patient was left hand but after stroke patient is now right handed.    Patient has  11th grade education.     Allergies  Allergen Reactions  . Amikacin Sulfate Other (See Comments)    Seizure   . Cefuroxime Axetil  Anaphylaxis    Tolerated cefepime 12/2015 - with Pepcid/Solu-Medrol  . Ertapenem Other (See Comments)    Rash and confusion-->tolerated Imipenem   . Morphine And Related Other (See Comments)    Changed mental status, confusion, headache, visual hallucination  . Penicillins Anaphylaxis and Other (See Comments)    Tolerated Imipenem; no reaction to 7 day course of amoxicillin in 2015 Has patient had a PCN reaction causing immediate rash, facial/tongue/throat swelling, SOB or lightheadedness with hypotension: Yes Has patient had a PCN reaction causing severe rash involving mucus membranes or skin necrosis: No  Has patient had a PCN reaction that required hospitalization Yes Has patient had a PCN reaction occurring within the last 10 years: No If all of the above answers are "NO", then may proceed with Cephalo  . Sulfa Antibiotics Anaphylaxis, Shortness Of Breath and Other (See Comments)  . Tessalon [Benzonatate] Anaphylaxis  . Shellfish Allergy Itching and Other (See Comments)    Took benadryl to alleviate reaction  . Levaquin [Levofloxacin In D5w] Swelling    Hand and forearm swelling  . Nsaids Itching and Other (See Comments)    Risk of bleeding, itching  . Miripirium Rash and Other (See Comments)    Change in mental status     Current Facility-Administered Medications:  .  lactated ringers infusion, , Intravenous, Continuous, Oleta Mouse, MD, Last Rate: 10 mL/hr at 07/05/16 [id number]  Facility-Administered Medications Ordered in Other Encounters:  .  dextrose 50 % solution, , , ,    Again for exam, assessment and plan see my clinic note

## 2016-07-05 NOTE — Anesthesia Preprocedure Evaluation (Signed)
Anesthesia Evaluation  Patient identified by MRN, date of birth, ID band Patient awake    Reviewed: Allergy & Precautions, NPO status , Patient's Chart, lab work & pertinent test results  History of Anesthesia Complications Negative for: history of anesthetic complications  Airway Mallampati: Trach       Dental  (+) Missing   Pulmonary asthma , pneumonia, former smoker,    breath sounds clear to auscultation       Cardiovascular  Rhythm:Regular Rate:Normal     Neuro/Psych Seizures -,  PSYCHIATRIC DISORDERS Anxiety Depression  Neuromuscular disease CVA    GI/Hepatic Neg liver ROS, GERD  Medicated,  Endo/Other  diabetes, Type 1, Insulin Dependent  Renal/GU Renal disease  negative genitourinary   Musculoskeletal  (+) Arthritis , Fibromyalgia -  Abdominal   Peds negative pediatric ROS (+)  Hematology  (+) anemia ,   Anesthesia Other Findings   Reproductive/Obstetrics negative OB ROS                             Anesthesia Physical Anesthesia Plan  ASA: III  Anesthesia Plan: General   Post-op Pain Management:    Induction: Inhalational  Airway Management Planned: Tracheostomy  Additional Equipment: None  Intra-op Plan:   Post-operative Plan:   Informed Consent: I have reviewed the patients History and Physical, chart, labs and discussed the procedure including the risks, benefits and alternatives for the proposed anesthesia with the patient or authorized representative who has indicated his/her understanding and acceptance.   Dental advisory given  Plan Discussed with: CRNA and Surgeon  Anesthesia Plan Comments:         Anesthesia Quick Evaluation

## 2016-07-05 NOTE — Progress Notes (Signed)
Care of pt assumed by MA Valisha Heslin RN 

## 2016-07-05 NOTE — Progress Notes (Signed)
CBG 115--reported to Dr Ermalene Postin. OK to dc pt to home

## 2016-07-06 ENCOUNTER — Encounter (HOSPITAL_COMMUNITY): Payer: Self-pay | Admitting: Radiology

## 2016-07-06 ENCOUNTER — Telehealth: Payer: Self-pay | Admitting: *Deleted

## 2016-07-06 NOTE — Telephone Encounter (Signed)
Ok that is good they are getting him in with [place] folks

## 2016-07-06 NOTE — Telephone Encounter (Signed)
-----   Message from Truman Hayward, MD sent at 07/06/2016 12:17 PM EDT ----- MRI not very compelling for osteomyelitis. He should keep his followup with [name]

## 2016-07-06 NOTE — Telephone Encounter (Signed)
Relayed results to patient's mother. Patient unable to be seen in Dr. Eusebio Friendly clinic due to the size of clinic exam rooms. She has referred Godric to Dr. Neysa Hotter at [facility]. Per mother, this is a referral for wound care alterations in addition to muscle flap consideration? He is being seen 10/10 with Dr. Luetta Nutting. [name], RN

## 2016-07-09 NOTE — Telephone Encounter (Signed)
Relayed to patient's mother.

## 2016-07-10 NOTE — Telephone Encounter (Signed)
Thanks Michelle

## 2016-07-11 ENCOUNTER — Telehealth: Payer: Self-pay | Admitting: Family Medicine

## 2016-07-11 ENCOUNTER — Other Ambulatory Visit: Payer: Self-pay | Admitting: Internal Medicine

## 2016-07-11 ENCOUNTER — Encounter (HOSPITAL_COMMUNITY): Payer: Self-pay | Admitting: Interventional Radiology

## 2016-07-11 ENCOUNTER — Ambulatory Visit (HOSPITAL_COMMUNITY)
Admission: RE | Admit: 2016-07-11 | Discharge: 2016-07-11 | Disposition: A | Discharge status: Home / Self Care | Payer: Medicaid Other | Source: Ambulatory Visit | Attending: Internal Medicine | Admitting: Internal Medicine

## 2016-07-11 DIAGNOSIS — K9423 Gastrostomy malfunction: Secondary | ICD-10-CM

## 2016-07-11 DIAGNOSIS — Z431 Encounter for attention to gastrostomy: Secondary | ICD-10-CM | POA: Insufficient documentation

## 2016-07-11 HISTORY — PX: IR GENERIC HISTORICAL: IMG1180011

## 2016-07-11 MED ORDER — IOPAMIDOL (ISOVUE-300) INJECTION 61%
50.0000 mL | Freq: Once | INTRAVENOUS | Status: AC | PRN
  Administered 2016-07-11: 10 mL via INTRAVENOUS

## 2016-07-11 MED ORDER — FENTANYL 75 MCG/HR TD PT72: MEDICATED_PATCH | TRANSDERMAL | 0 refills | Status: DC

## 2016-07-11 MED ORDER — OXYCODONE HCL 20 MG PO TABS: 20.0000 mg | ORAL_TABLET | Freq: Four times a day (QID) | ORAL | 0 refills | Status: DC | PRN

## 2016-07-11 MED ORDER — HYDROMORPHONE HCL 8 MG PO TABS: ORAL_TABLET | ORAL | 0 refills | Status: DC

## 2016-07-11 MED FILL — Ondansetron HCl Inj 4 MG/2ML (2 MG/ML): INTRAMUSCULAR | Qty: 2 | Status: AC

## 2016-07-11 MED FILL — Lactated Ringer's Solution: INTRAVENOUS | Qty: 1000 | Status: AC

## 2016-07-11 NOTE — Telephone Encounter (Signed)
done

## 2016-07-12 ENCOUNTER — Telehealth: Payer: Self-pay | Admitting: Family Medicine

## 2016-07-12 ENCOUNTER — Encounter (HOSPITAL_BASED_OUTPATIENT_CLINIC_OR_DEPARTMENT_OTHER): Payer: Medicaid Other | Attending: Internal Medicine

## 2016-07-12 DIAGNOSIS — L89154 Pressure ulcer of sacral region, stage 4: Secondary | ICD-10-CM

## 2016-07-12 NOTE — Telephone Encounter (Signed)
Pt has medicare and needs referral to dr Norberto Sorenson marks at [facility] for evaluation for flap  and wound care. Phone [phone]

## 2016-07-12 NOTE — Anesthesia Postprocedure Evaluation (Signed)
Anesthesia Post Note  Patient: Jason [name]  Procedure(s) Performed: Procedure(s) (LRB): RADIOLOGY WITH ANESTHESIA MRI OF PELVIS WITH AND WITHOUT (N/A)  Patient location during evaluation: PACU Anesthesia Type: General Level of consciousness: awake Pain management: pain level controlled Vital Signs Assessment: post-procedure vital signs reviewed and stable Respiratory status: spontaneous breathing Cardiovascular status: stable Postop Assessment: no signs of nausea or vomiting Anesthetic complications: no    Last Vitals:  Vitals:   07/05/16 1033 07/05/16 1036  BP: 114/74 107/79  Pulse: 87 89  Resp: 16 18  Temp:      Last Pain:  Vitals:   07/05/16 1036  TempSrc:   PainSc: 0-No pain                 Marieta Markov

## 2016-07-16 NOTE — Telephone Encounter (Signed)
Referral was done  

## 2016-07-17 NOTE — Telephone Encounter (Signed)
I spoke with mom and went over below information.

## 2016-07-24 ENCOUNTER — Emergency Department (HOSPITAL_COMMUNITY): Payer: Medicaid Other

## 2016-07-24 ENCOUNTER — Inpatient Hospital Stay (HOSPITAL_COMMUNITY): Payer: Medicaid Other

## 2016-07-24 ENCOUNTER — Encounter (HOSPITAL_COMMUNITY): Payer: Self-pay | Admitting: Emergency Medicine

## 2016-07-24 ENCOUNTER — Inpatient Hospital Stay (HOSPITAL_COMMUNITY)
Admission: EM | Admit: 2016-07-24 | Discharge: 2016-08-01 | DRG: 698 | Disposition: A | Discharge status: Home Health | Payer: Medicaid Other | Attending: Internal Medicine | Admitting: Internal Medicine

## 2016-07-24 DIAGNOSIS — B964 Proteus (mirabilis) (morganii) as the cause of diseases classified elsewhere: Secondary | ICD-10-CM | POA: Diagnosis present

## 2016-07-24 DIAGNOSIS — Z933 Colostomy status: Secondary | ICD-10-CM

## 2016-07-24 DIAGNOSIS — D649 Anemia, unspecified: Secondary | ICD-10-CM | POA: Diagnosis present

## 2016-07-24 DIAGNOSIS — Y95 Nosocomial condition: Secondary | ICD-10-CM | POA: Diagnosis present

## 2016-07-24 DIAGNOSIS — E2740 Unspecified adrenocortical insufficiency: Secondary | ICD-10-CM

## 2016-07-24 DIAGNOSIS — M797 Fibromyalgia: Secondary | ICD-10-CM | POA: Diagnosis present

## 2016-07-24 DIAGNOSIS — B951 Streptococcus, group B, as the cause of diseases classified elsewhere: Secondary | ICD-10-CM | POA: Diagnosis present

## 2016-07-24 DIAGNOSIS — L89154 Pressure ulcer of sacral region, stage 4: Secondary | ICD-10-CM | POA: Diagnosis present

## 2016-07-24 DIAGNOSIS — B9620 Unspecified Escherichia coli [E. coli] as the cause of diseases classified elsewhere: Secondary | ICD-10-CM | POA: Diagnosis present

## 2016-07-24 DIAGNOSIS — Z7401 Bed confinement status: Secondary | ICD-10-CM

## 2016-07-24 DIAGNOSIS — E871 Hypo-osmolality and hyponatremia: Secondary | ICD-10-CM | POA: Diagnosis present

## 2016-07-24 DIAGNOSIS — Y833 Surgical operation with formation of external stoma as the cause of abnormal reaction of the patient, or of later complication, without mention of misadventure at the time of the procedure: Secondary | ICD-10-CM | POA: Diagnosis not present

## 2016-07-24 DIAGNOSIS — G9340 Encephalopathy, unspecified: Secondary | ICD-10-CM | POA: Diagnosis present

## 2016-07-24 DIAGNOSIS — Y846 Urinary catheterization as the cause of abnormal reaction of the patient, or of later complication, without mention of misadventure at the time of the procedure: Secondary | ICD-10-CM | POA: Diagnosis present

## 2016-07-24 DIAGNOSIS — E10649 Type 1 diabetes mellitus with hypoglycemia without coma: Secondary | ICD-10-CM | POA: Diagnosis present

## 2016-07-24 DIAGNOSIS — E1069 Type 1 diabetes mellitus with other specified complication: Secondary | ICD-10-CM | POA: Diagnosis present

## 2016-07-24 DIAGNOSIS — Z794 Long term (current) use of insulin: Secondary | ICD-10-CM

## 2016-07-24 DIAGNOSIS — M4620 Osteomyelitis of vertebra, site unspecified: Secondary | ICD-10-CM

## 2016-07-24 DIAGNOSIS — S14106S Unspecified injury at C6 level of cervical spinal cord, sequela: Secondary | ICD-10-CM

## 2016-07-24 DIAGNOSIS — R404 Transient alteration of awareness: Secondary | ICD-10-CM | POA: Diagnosis present

## 2016-07-24 DIAGNOSIS — G8929 Other chronic pain: Secondary | ICD-10-CM | POA: Diagnosis present

## 2016-07-24 DIAGNOSIS — L89009 Pressure ulcer of unspecified elbow, unspecified stage: Secondary | ICD-10-CM | POA: Diagnosis not present

## 2016-07-24 DIAGNOSIS — R6510 Systemic inflammatory response syndrome (SIRS) of non-infectious origin without acute organ dysfunction: Secondary | ICD-10-CM | POA: Diagnosis present

## 2016-07-24 DIAGNOSIS — T83510A Infection and inflammatory reaction due to cystostomy catheter, initial encounter: Principal | ICD-10-CM | POA: Diagnosis present

## 2016-07-24 DIAGNOSIS — Z9359 Other cystostomy status: Secondary | ICD-10-CM

## 2016-07-24 DIAGNOSIS — R7881 Bacteremia: Secondary | ICD-10-CM | POA: Diagnosis not present

## 2016-07-24 DIAGNOSIS — E108 Type 1 diabetes mellitus with unspecified complications: Secondary | ICD-10-CM | POA: Diagnosis not present

## 2016-07-24 DIAGNOSIS — N319 Neuromuscular dysfunction of bladder, unspecified: Secondary | ICD-10-CM | POA: Diagnosis present

## 2016-07-24 DIAGNOSIS — F419 Anxiety disorder, unspecified: Secondary | ICD-10-CM | POA: Diagnosis present

## 2016-07-24 DIAGNOSIS — L89004 Pressure ulcer of unspecified elbow, stage 4: Secondary | ICD-10-CM | POA: Diagnosis not present

## 2016-07-24 DIAGNOSIS — Z930 Tracheostomy status: Secondary | ICD-10-CM

## 2016-07-24 DIAGNOSIS — Z8614 Personal history of Methicillin resistant Staphylococcus aureus infection: Secondary | ICD-10-CM

## 2016-07-24 DIAGNOSIS — I959 Hypotension, unspecified: Secondary | ICD-10-CM | POA: Diagnosis present

## 2016-07-24 DIAGNOSIS — Z87891 Personal history of nicotine dependence: Secondary | ICD-10-CM

## 2016-07-24 DIAGNOSIS — E10319 Type 1 diabetes mellitus with unspecified diabetic retinopathy without macular edema: Secondary | ICD-10-CM | POA: Diagnosis present

## 2016-07-24 DIAGNOSIS — E1043 Type 1 diabetes mellitus with diabetic autonomic (poly)neuropathy: Secondary | ICD-10-CM | POA: Diagnosis present

## 2016-07-24 DIAGNOSIS — N390 Urinary tract infection, site not specified: Secondary | ICD-10-CM | POA: Diagnosis present

## 2016-07-24 DIAGNOSIS — K3184 Gastroparesis: Secondary | ICD-10-CM | POA: Diagnosis present

## 2016-07-24 DIAGNOSIS — K219 Gastro-esophageal reflux disease without esophagitis: Secondary | ICD-10-CM

## 2016-07-24 DIAGNOSIS — K9423 Gastrostomy malfunction: Secondary | ICD-10-CM | POA: Diagnosis not present

## 2016-07-24 DIAGNOSIS — Z7952 Long term (current) use of systemic steroids: Secondary | ICD-10-CM

## 2016-07-24 DIAGNOSIS — Z8619 Personal history of other infectious and parasitic diseases: Secondary | ICD-10-CM | POA: Diagnosis present

## 2016-07-24 DIAGNOSIS — Z79899 Other long term (current) drug therapy: Secondary | ICD-10-CM

## 2016-07-24 DIAGNOSIS — K5909 Other constipation: Secondary | ICD-10-CM | POA: Diagnosis present

## 2016-07-24 DIAGNOSIS — Z8673 Personal history of transient ischemic attack (TIA), and cerebral infarction without residual deficits: Secondary | ICD-10-CM

## 2016-07-24 DIAGNOSIS — R532 Functional quadriplegia: Secondary | ICD-10-CM | POA: Diagnosis present

## 2016-07-24 DIAGNOSIS — J189 Pneumonia, unspecified organism: Secondary | ICD-10-CM | POA: Diagnosis present

## 2016-07-24 DIAGNOSIS — M8668 Other chronic osteomyelitis, other site: Secondary | ICD-10-CM | POA: Diagnosis present

## 2016-07-24 DIAGNOSIS — L8990 Pressure ulcer of unspecified site, unspecified stage: Secondary | ICD-10-CM | POA: Diagnosis present

## 2016-07-24 DIAGNOSIS — F329 Major depressive disorder, single episode, unspecified: Secondary | ICD-10-CM | POA: Diagnosis present

## 2016-07-24 LAB — CBC WITH DIFFERENTIAL/PLATELET
BASOS ABS: 0 10*3/uL (ref 0.0–0.1)
Basophils Relative: 0 %
EOS ABS: 0 10*3/uL (ref 0.0–0.7)
Eosinophils Relative: 0 %
HCT: 21.5 % — ABNORMAL LOW (ref 39.0–52.0)
Hemoglobin: 6.6 g/dL — CL (ref 13.0–17.0)
LYMPHS PCT: 4 %
Lymphs Abs: 1.6 10*3/uL (ref 0.7–4.0)
MCH: 25.3 pg — AB (ref 26.0–34.0)
MCHC: 31.2 g/dL (ref 30.0–36.0)
MCV: 81.1 fL (ref 78.0–100.0)
Monocytes Absolute: 2.8 10*3/uL — ABNORMAL HIGH (ref 0.1–1.0)
Monocytes Relative: 7 %
NEUTROS ABS: 35.6 10*3/uL — AB (ref 1.7–7.7)
NEUTROS PCT: 89 %
PLATELETS: 887 10*3/uL — AB (ref 150–400)
RBC: 2.65 MIL/uL — AB (ref 4.22–5.81)
RDW: 15.1 % (ref 11.5–15.5)
WBC MORPHOLOGY: INCREASED
WBC: 40 10*3/uL — AB (ref 4.0–10.5)

## 2016-07-24 LAB — I-STAT CHEM 8, ED
BUN: 30 mg/dL — AB (ref 6–20)
CALCIUM ION: 1.17 mmol/L (ref 1.15–1.40)
CHLORIDE: 100 mmol/L — AB (ref 101–111)
CREATININE: 1 mg/dL (ref 0.61–1.24)
GLUCOSE: 157 mg/dL — AB (ref 65–99)
HCT: 24 % — ABNORMAL LOW (ref 39.0–52.0)
Hemoglobin: 8.2 g/dL — ABNORMAL LOW (ref 13.0–17.0)
Potassium: 4.5 mmol/L (ref 3.5–5.1)
Sodium: 129 mmol/L — ABNORMAL LOW (ref 135–145)
TCO2: 18 mmol/L (ref 0–100)

## 2016-07-24 LAB — COMPREHENSIVE METABOLIC PANEL
ALK PHOS: 173 U/L — AB (ref 38–126)
ALT: 16 U/L — ABNORMAL LOW (ref 17–63)
ANION GAP: 10 (ref 5–15)
AST: 17 U/L (ref 15–41)
Albumin: 1.7 g/dL — ABNORMAL LOW (ref 3.5–5.0)
BILIRUBIN TOTAL: 0.7 mg/dL (ref 0.3–1.2)
BUN: 28 mg/dL — ABNORMAL HIGH (ref 6–20)
CALCIUM: 8.4 mg/dL — AB (ref 8.9–10.3)
CO2: 17 mmol/L — ABNORMAL LOW (ref 22–32)
Chloride: 100 mmol/L — ABNORMAL LOW (ref 101–111)
Creatinine, Ser: 1.07 mg/dL (ref 0.61–1.24)
GLUCOSE: 160 mg/dL — AB (ref 65–99)
POTASSIUM: 4.4 mmol/L (ref 3.5–5.1)
Sodium: 127 mmol/L — ABNORMAL LOW (ref 135–145)
TOTAL PROTEIN: 6.7 g/dL (ref 6.5–8.1)

## 2016-07-24 LAB — BLOOD CULTURE ID PANEL (REFLEXED)
ACINETOBACTER BAUMANNII: NOT DETECTED
CANDIDA ALBICANS: NOT DETECTED
CANDIDA KRUSEI: NOT DETECTED
CANDIDA PARAPSILOSIS: NOT DETECTED
CANDIDA TROPICALIS: NOT DETECTED
Candida glabrata: NOT DETECTED
Enterobacter cloacae complex: NOT DETECTED
Enterobacteriaceae species: NOT DETECTED
Enterococcus species: NOT DETECTED
Escherichia coli: NOT DETECTED
HAEMOPHILUS INFLUENZAE: NOT DETECTED
KLEBSIELLA PNEUMONIAE: NOT DETECTED
Klebsiella oxytoca: NOT DETECTED
Listeria monocytogenes: NOT DETECTED
Neisseria meningitidis: NOT DETECTED
Proteus species: NOT DETECTED
Pseudomonas aeruginosa: NOT DETECTED
SERRATIA MARCESCENS: NOT DETECTED
STAPHYLOCOCCUS AUREUS BCID: NOT DETECTED
STREPTOCOCCUS SPECIES: DETECTED — AB
Staphylococcus species: NOT DETECTED
Streptococcus agalactiae: DETECTED — AB
Streptococcus pneumoniae: NOT DETECTED
Streptococcus pyogenes: NOT DETECTED

## 2016-07-24 LAB — RAPID URINE DRUG SCREEN, HOSP PERFORMED
Amphetamines: NOT DETECTED
BARBITURATES: NOT DETECTED
Benzodiazepines: NOT DETECTED
Cocaine: NOT DETECTED
Opiates: POSITIVE — AB
TETRAHYDROCANNABINOL: NOT DETECTED

## 2016-07-24 LAB — URINALYSIS, ROUTINE W REFLEX MICROSCOPIC
GLUCOSE, UA: NEGATIVE mg/dL
KETONES UR: 15 mg/dL — AB
NITRITE: NEGATIVE
PH: 7.5 (ref 5.0–8.0)
Protein, ur: 100 mg/dL — AB
Specific Gravity, Urine: 1.015 (ref 1.005–1.030)

## 2016-07-24 LAB — GLUCOSE, CAPILLARY
GLUCOSE-CAPILLARY: 127 mg/dL — AB (ref 65–99)
GLUCOSE-CAPILLARY: 93 mg/dL (ref 65–99)

## 2016-07-24 LAB — CORTISOL-AM, BLOOD: CORTISOL - AM: 19 ug/dL (ref 6.7–22.6)

## 2016-07-24 LAB — PREPARE RBC (CROSSMATCH)

## 2016-07-24 LAB — URINE MICROSCOPIC-ADD ON

## 2016-07-24 LAB — STREP PNEUMONIAE URINARY ANTIGEN: STREP PNEUMO URINARY ANTIGEN: POSITIVE — AB

## 2016-07-24 LAB — I-STAT VENOUS BLOOD GAS, ED
Acid-base deficit: 9 mmol/L — ABNORMAL HIGH (ref 0.0–2.0)
Bicarbonate: 15.8 mmol/L — ABNORMAL LOW (ref 20.0–28.0)
O2 Saturation: 77 %
PCO2 VEN: 28.5 mmHg — AB (ref 44.0–60.0)
PH VEN: 7.352 (ref 7.250–7.430)
TCO2: 17 mmol/L (ref 0–100)
pO2, Ven: 43 mmHg (ref 32.0–45.0)

## 2016-07-24 LAB — CBG MONITORING, ED
Glucose-Capillary: 157 mg/dL — ABNORMAL HIGH (ref 65–99)
Glucose-Capillary: 134 mg/dL — ABNORMAL HIGH (ref 65–99)
Glucose-Capillary: 100 mg/dL — ABNORMAL HIGH (ref 65–99)

## 2016-07-24 LAB — ETHANOL

## 2016-07-24 LAB — AMMONIA: Ammonia: 25 umol/L (ref 9–35)

## 2016-07-24 LAB — I-STAT CG4 LACTIC ACID, ED: LACTIC ACID, VENOUS: 0.52 mmol/L (ref 0.5–1.9)

## 2016-07-24 LAB — PREALBUMIN: Prealbumin: <5 mg/dL — ABNORMAL LOW (ref 18–38)

## 2016-07-24 LAB — POC OCCULT BLOOD, ED: FECAL OCCULT BLD: NEGATIVE

## 2016-07-24 MED ORDER — OXYBUTYNIN CHLORIDE 5 MG/5ML PO SYRP
5.0000 mg | ORAL_SOLUTION | Freq: Two times a day (BID) | ORAL | Status: AC
  Administered 2016-07-25 – 2016-07-28 (×8): 5 mg
  Filled 2016-07-24 (×8): qty 5

## 2016-07-24 MED ORDER — SODIUM CHLORIDE 0.9 % IV SOLN
INTRAVENOUS | Status: DC
  Administered 2016-07-24: 50 mL/h via INTRAVENOUS
  Administered 2016-07-26 – 2016-07-27 (×2): via INTRAVENOUS
  Administered 2016-07-29: 50 mL/h via INTRAVENOUS
  Administered 2016-07-30 – 2016-07-31 (×2): via INTRAVENOUS

## 2016-07-24 MED ORDER — INSULIN ASPART 100 UNIT/ML ~~LOC~~ SOLN
0.0000 [IU] | Freq: Every day | SUBCUTANEOUS | Status: DC
  Administered 2016-07-26: 3 [IU] via SUBCUTANEOUS
  Administered 2016-07-26: 5 [IU] via SUBCUTANEOUS

## 2016-07-24 MED ORDER — SODIUM CHLORIDE 0.9 % IV SOLN
500.0000 mg | Freq: Three times a day (TID) | INTRAVENOUS | Status: DC
  Filled 2016-07-24 (×2): qty 500

## 2016-07-24 MED ORDER — COLLAGENASE 250 UNIT/GM EX OINT: TOPICAL_OINTMENT | CUTANEOUS | Status: DC

## 2016-07-24 MED ORDER — BACLOFEN 20 MG PO TABS
20.0000 mg | ORAL_TABLET | Freq: Four times a day (QID) | ORAL | Status: DC
  Administered 2016-07-24 – 2016-08-01 (×32): 20 mg via ORAL
  Filled 2016-07-24 (×14): qty 1
  Filled 2016-07-24: qty 4
  Filled 2016-07-24 (×18): qty 1

## 2016-07-24 MED ORDER — SODIUM CHLORIDE 0.9 % IV SOLN
INTRAVENOUS | Status: DC
  Administered 2016-07-24: 05:00:00 via INTRAVENOUS

## 2016-07-24 MED ORDER — DOCUSATE SODIUM 50 MG/5ML PO LIQD
100.0000 mg | Freq: Every day | ORAL | Status: DC
  Filled 2016-07-24: qty 10

## 2016-07-24 MED ORDER — MAGNESIUM OXIDE 400 (241.3 MG) MG PO TABS
400.0000 mg | ORAL_TABLET | Freq: Two times a day (BID) | ORAL | Status: DC
  Filled 2016-07-24 (×2): qty 1

## 2016-07-24 MED ORDER — DICLOFENAC SODIUM 1 % TD GEL
2.0000 g | Freq: Four times a day (QID) | TRANSDERMAL | Status: DC | PRN
  Filled 2016-07-24: qty 100

## 2016-07-24 MED ORDER — HYDROMORPHONE HCL 2 MG PO TABS
8.0000 mg | ORAL_TABLET | Freq: Four times a day (QID) | ORAL | Status: DC | PRN
  Administered 2016-07-25 – 2016-08-01 (×12): 8 mg via ORAL
  Filled 2016-07-24 (×13): qty 4

## 2016-07-24 MED ORDER — PREGABALIN 100 MG PO CAPS
100.0000 mg | ORAL_CAPSULE | Freq: Three times a day (TID) | ORAL | Status: DC
  Administered 2016-07-24 – 2016-08-01 (×25): 100 mg via ORAL
  Filled 2016-07-24 (×5): qty 1
  Filled 2016-07-24: qty 2
  Filled 2016-07-24 (×19): qty 1

## 2016-07-24 MED ORDER — SODIUM CHLORIDE 0.9 % IV SOLN
Freq: Once | INTRAVENOUS | Status: AC
  Administered 2016-07-24: 07:00:00 via INTRAVENOUS

## 2016-07-24 MED ORDER — GLUCERNA PO LIQD: 1000.0000 mL | Freq: Every day | ORAL | Status: DC

## 2016-07-24 MED ORDER — INSULIN GLARGINE 100 UNIT/ML ~~LOC~~ SOLN
15.0000 [IU] | Freq: Every day | SUBCUTANEOUS | Status: DC
  Administered 2016-07-25 – 2016-07-26 (×2): 15 [IU] via SUBCUTANEOUS
  Filled 2016-07-24 (×5): qty 0.15

## 2016-07-24 MED ORDER — HYDROCORTISONE 20 MG PO TABS
20.0000 mg | ORAL_TABLET | Freq: Every morning | ORAL | Status: DC
  Administered 2016-07-24 – 2016-07-30 (×7): 20 mg via ORAL
  Filled 2016-07-24 (×8): qty 1

## 2016-07-24 MED ORDER — LORAZEPAM 0.5 MG PO TABS: 0.2500 mg | ORAL_TABLET | Freq: Three times a day (TID) | ORAL | Status: DC | PRN

## 2016-07-24 MED ORDER — BUPROPION HCL ER (XL) 150 MG PO TB24
150.0000 mg | ORAL_TABLET | Freq: Every day | ORAL | Status: DC
  Filled 2016-07-24: qty 1

## 2016-07-24 MED ORDER — ONDANSETRON 4 MG PO TBDP
8.0000 mg | ORAL_TABLET | Freq: Three times a day (TID) | ORAL | Status: DC | PRN
  Filled 2016-07-24: qty 1

## 2016-07-24 MED ORDER — BISACODYL 5 MG PO TBEC
5.0000 mg | DELAYED_RELEASE_TABLET | Freq: Every day | ORAL | Status: DC | PRN
  Administered 2016-07-30: 5 mg via ORAL
  Filled 2016-07-24 (×2): qty 1

## 2016-07-24 MED ORDER — MAGNESIUM OXIDE -MG SUPPLEMENT 400 (240 MG) MG PO TABS: 400.0000 mg | ORAL_TABLET | Freq: Two times a day (BID) | ORAL | Status: DC

## 2016-07-24 MED ORDER — SODIUM CHLORIDE 0.9 % IV BOLUS (SEPSIS)
1000.0000 mL | Freq: Once | INTRAVENOUS | Status: AC
  Administered 2016-07-24: 1000 mL via INTRAVENOUS

## 2016-07-24 MED ORDER — SODIUM CHLORIDE 0.9 % IV SOLN
500.0000 mg | Freq: Once | INTRAVENOUS | Status: AC
  Administered 2016-07-24: 500 mg via INTRAVENOUS
  Filled 2016-07-24: qty 500

## 2016-07-24 MED ORDER — PANTOPRAZOLE SODIUM 40 MG PO PACK
40.0000 mg | PACK | Freq: Every day | ORAL | Status: DC
  Administered 2016-07-25: 40 mg
  Filled 2016-07-24: qty 20

## 2016-07-24 MED ORDER — HEPARIN SODIUM (PORCINE) 5000 UNIT/ML IJ SOLN
5000.0000 [IU] | Freq: Three times a day (TID) | INTRAMUSCULAR | Status: DC
  Administered 2016-07-24 – 2016-08-01 (×24): 5000 [IU] via SUBCUTANEOUS
  Filled 2016-07-24 (×24): qty 1

## 2016-07-24 MED ORDER — FENTANYL 25 MCG/HR TD PT72: 75.0000 ug | MEDICATED_PATCH | TRANSDERMAL | Status: DC

## 2016-07-24 MED ORDER — OXYCODONE HCL 5 MG PO TABS
20.0000 mg | ORAL_TABLET | Freq: Four times a day (QID) | ORAL | Status: DC | PRN
  Administered 2016-07-25 – 2016-07-31 (×8): 20 mg via ORAL
  Filled 2016-07-24 (×8): qty 4

## 2016-07-24 MED ORDER — CEFTRIAXONE SODIUM 2 G IJ SOLR
2.0000 g | INTRAMUSCULAR | Status: DC
  Filled 2016-07-24: qty 2

## 2016-07-24 MED ORDER — DEXTROSE 5 % IV SOLN
2.0000 g | Freq: Once | INTRAVENOUS | Status: AC
  Administered 2016-07-24: 2 g via INTRAVENOUS
  Filled 2016-07-24: qty 2

## 2016-07-24 MED ORDER — GLUCERNA 1.2 CAL PO LIQD
1000.0000 mL | Freq: Every day | ORAL | Status: DC
  Administered 2016-07-24: 1000 mL
  Filled 2016-07-24 (×4): qty 1000

## 2016-07-24 MED ORDER — FREE WATER
300.0000 mL | Freq: Three times a day (TID) | Status: DC
Start: 2016-07-24 — End: 2016-08-01
  Administered 2016-07-24 – 2016-07-26 (×6): 300 mL
  Administered 2016-07-27: 350 mL
  Administered 2016-07-27: 300 mL
  Administered 2016-07-28: 350 mL
  Administered 2016-07-28 – 2016-07-29 (×2): 300 mL
  Administered 2016-07-29: 400 mL
  Administered 2016-07-31 – 2016-08-01 (×3): 300 mL

## 2016-07-24 MED ORDER — ALBUTEROL SULFATE (2.5 MG/3ML) 0.083% IN NEBU: 2.5000 mg | INHALATION_SOLUTION | RESPIRATORY_TRACT | Status: DC | PRN

## 2016-07-24 MED ORDER — FENTANYL 50 MCG/HR TD PT72
75.0000 ug | MEDICATED_PATCH | TRANSDERMAL | Status: DC
  Administered 2016-07-25 – 2016-07-31 (×3): 75 ug via TRANSDERMAL
  Filled 2016-07-24 (×3): qty 1

## 2016-07-24 MED ORDER — BUPROPION HCL ER (XL) 150 MG PO TB24
150.0000 mg | ORAL_TABLET | Freq: Every day | ORAL | Status: DC
  Administered 2016-07-25 – 2016-08-01 (×8): 150 mg via ORAL
  Filled 2016-07-24 (×8): qty 1

## 2016-07-24 MED ORDER — ALBUTEROL SULFATE (2.5 MG/3ML) 0.083% IN NEBU
2.5000 mg | INHALATION_SOLUTION | Freq: Two times a day (BID) | RESPIRATORY_TRACT | Status: DC
  Administered 2016-07-24 – 2016-08-01 (×14): 2.5 mg via RESPIRATORY_TRACT
  Filled 2016-07-24 (×15): qty 3

## 2016-07-24 MED ORDER — MAGNESIUM OXIDE 400 (241.3 MG) MG PO TABS
400.0000 mg | ORAL_TABLET | Freq: Two times a day (BID) | ORAL | Status: DC
  Administered 2016-07-25 – 2016-08-01 (×16): 400 mg via ORAL
  Filled 2016-07-24 (×17): qty 1

## 2016-07-24 MED ORDER — OXYCODONE HCL 20 MG PO TABS: 20.0000 mg | ORAL_TABLET | Freq: Four times a day (QID) | ORAL | Status: DC | PRN

## 2016-07-24 MED ORDER — DOCUSATE SODIUM 50 MG/5ML PO LIQD
100.0000 mg | Freq: Every day | ORAL | Status: DC
  Administered 2016-07-24 – 2016-08-01 (×9): 100 mg via ORAL
  Filled 2016-07-24 (×9): qty 10

## 2016-07-24 MED ORDER — HYDROCORTISONE 20 MG PO TABS
20.0000 mg | ORAL_TABLET | ORAL | Status: DC
  Filled 2016-07-24: qty 1

## 2016-07-24 MED ORDER — ALBUTEROL SULFATE (2.5 MG/3ML) 0.083% IN NEBU: 2.5000 mg | INHALATION_SOLUTION | RESPIRATORY_TRACT | Status: DC

## 2016-07-24 MED ORDER — NON FORMULARY: 60.0000 mg | Freq: Every day | Status: DC

## 2016-07-24 MED ORDER — INSULIN ASPART 100 UNIT/ML ~~LOC~~ SOLN
0.0000 [IU] | Freq: Three times a day (TID) | SUBCUTANEOUS | Status: DC
  Administered 2016-07-25: 2 [IU] via SUBCUTANEOUS
  Administered 2016-07-26 (×2): 7 [IU] via SUBCUTANEOUS
  Administered 2016-07-26: 9 [IU] via SUBCUTANEOUS
  Administered 2016-07-27 (×2): 20 [IU] via SUBCUTANEOUS
  Filled 2016-07-24: qty 1

## 2016-07-24 MED ORDER — SODIUM CHLORIDE 0.9% FLUSH
3.0000 mL | Freq: Two times a day (BID) | INTRAVENOUS | Status: DC
  Administered 2016-07-25 – 2016-07-31 (×7): 3 mL via INTRAVENOUS

## 2016-07-24 MED ORDER — VANCOMYCIN HCL IN DEXTROSE 1-5 GM/200ML-% IV SOLN
1000.0000 mg | Freq: Once | INTRAVENOUS | Status: AC
  Administered 2016-07-24: 1000 mg via INTRAVENOUS
  Filled 2016-07-24: qty 200

## 2016-07-24 MED ORDER — VANCOMYCIN HCL IN DEXTROSE 750-5 MG/150ML-% IV SOLN
750.0000 mg | INTRAVENOUS | Status: DC
  Filled 2016-07-24: qty 150

## 2016-07-24 MED ORDER — MIDODRINE HCL 5 MG PO TABS: 40.0000 mg | ORAL_TABLET | Freq: Every day | ORAL | Status: DC | PRN

## 2016-07-24 NOTE — ED Notes (Signed)
Pt's mother called out, stated that pt's trach needs suctioning again; RN notified

## 2016-07-24 NOTE — ED Notes (Signed)
meds given as become avaILABLE

## 2016-07-24 NOTE — ED Notes (Signed)
Some jerking behavior noted but pt does not respond to questions. Mother states pt was calling her name.

## 2016-07-24 NOTE — Progress Notes (Signed)
Sputum sample obtained through tracheal aspirate. RT suctioned pt x3 through trach with no complications. Pt stable throughout. Sample sent to Lab. RT will continue to monitor

## 2016-07-24 NOTE — ED Notes (Signed)
Attempted report x 2 

## 2016-07-24 NOTE — Progress Notes (Signed)
Pharmacy Antibiotic Note  Jason [name] is a 30 y.o. male admitted on 07/24/2016 with pneumonia and UTI.  Pharmacy has been consulted for vancomycin and primaxin dosing. Pt is afebrile but WBC is significantly elevated at 40. Scr is WNL but above pts baseline. Pt wit history of quadriplegia so renal function is difficult to estimate. Pt is well known to pharmacy with extensive ID history with MDR organisms.   Plan: - Vancomycin 1gm IV x 1 (per MD) then 750mg  IV Q24H - Primaxin 500mg  IV Q8H - F/u renal fxn, C&S, clinical status and trough at SS  Height: 5\' 8"  (172.7 cm) Weight: 130 lb (59 kg) IBW/kg (Calculated) : 68.4  Temp (24hrs), Avg:96.5 F (35.8 C), Min:95.7 F (35.4 C), Max:97.3 F (36.3 C)   Recent Labs Lab 07/24/16 0116 07/24/16 0209  WBC 40.0*  --   CREATININE 1.07 1.00  LATICACIDVEN  --  0.52    Estimated Creatinine Clearance: 88.5 mL/min (by C-G formula based on SCr of 1 mg/dL).    Allergies  Allergen Reactions  . Amikacin Sulfate Other (See Comments)    Seizure   . Cefuroxime Axetil Anaphylaxis    Tolerated cefepime 12/2015 - with Pepcid/Solu-Medrol  . Ertapenem Other (See Comments)    Rash and confusion-->tolerated Imipenem   . Morphine And Related Other (See Comments)    Changed mental status, confusion, headache, visual hallucination  . Penicillins Anaphylaxis and Other (See Comments)    Tolerated Imipenem; no reaction to 7 day course of amoxicillin in 2015 Has patient had a PCN reaction causing immediate rash, facial/tongue/throat swelling, SOB or lightheadedness with hypotension: Yes Has patient had a PCN reaction causing severe rash involving mucus membranes or skin necrosis: No Has patient had a PCN reaction that required hospitalization Yes Has patient had a PCN reaction occurring within the last 10 years: No If all of the above answers are "NO", then may proceed with Cephalo  . Sulfa Antibiotics Anaphylaxis, Shortness Of Breath and Other (See  Comments)  . Tessalon [Benzonatate] Anaphylaxis  . Shellfish Allergy Itching and Other (See Comments)    Took benadryl to alleviate reaction  . Levaquin [Levofloxacin In D5w] Swelling    Hand and forearm swelling  . Nsaids Itching and Other (See Comments)    Risk of bleeding, itching  . Miripirium Rash and Other (See Comments)    Change in mental status    Antimicrobials this admission: Vanc 10/17>> Primaxin 10/17>> Cefepime x 10/17  Dose adjustments this admission: N/A  Microbiology results: Pending  Thank you for allowing pharmacy to be a part of this patient's care.  Reesa Gotschall, Rande Lawman 07/24/2016 10:25 AM

## 2016-07-24 NOTE — ED Notes (Signed)
Pt cleaned and linen changed. Breakdown of skin noted at multiple sites. Quarter size pressure wound at right inner arm, 2nch diameter at left inner thigh. 3inch diameter at right mid back. Wound vac noted over sacrum. breackdown and excoriation noted over scrotum.

## 2016-07-24 NOTE — ED Notes (Signed)
ATC set up per RT. Oxygen humidity provided 28% FiO2 5l. MD/RN at bedside. No distress noted. MD assessing patient status

## 2016-07-24 NOTE — ED Notes (Signed)
Patient transported to CT 

## 2016-07-24 NOTE — Progress Notes (Signed)
Pt spo2 dropped to 88%. RT used 10 ml saline flush down trach with manual ventilation to help break up any possible mucous plugs. Pt manually ventilated x1 minute then tracheal suctioned x2. Pt spo2 increased to 100%. Pt in no distress, vs stable at this time (spo2100%). RT will continue to monitor.

## 2016-07-24 NOTE — Consult Note (Signed)
[place] for Infectious Disease  Total days of antibiotics 1        Day 1 Vancomycin        Day 1 Imipenem-Cilastin        Day 1 Cefepime       Reason for Consult: Sepsis    Referring Physician: Dr. 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  History is provided by patient's mother who is his primary caretaker. Patient has become more responsive since presentation to the ED; however, is unable to answer yes or no questions. Per patient's mother, she first noticed an alteration in his mentation on 10/16 around 1600. Patient was somnolent and when she went to awake him, he was difficult to arouse and would act inappropriately and differently than normal. For example, patient's mother describes the patient as being scared, confused and crying when awoken. He continues to grimace but does not acknowledge pain. This is somewhat similar to prior presentations to the ED when he  has had an underlying infection. In regards to his trach, patient last had it changed two weeks ago. He has a productive cough of phlegm which is not unusual for him. He is well trained on how to lean forward and use his diaphragm to clear the phlegm. His mother reports they have not had to use suction because of this. He has not complained of any recent pulmonary symptoms. In regards to his suprapubic catheter, it was last changed 2 weeks ago. His mother is concerned the size is too small as urine has been frequently leaking around the catheter. She denies change in color or content of the urine. He has had poor urine output today. In regards to his decubitus ulcers, patient's mother has been changing his wound vac every 2-3 days. She reports it is malodorous (but no recent change) but the wound vac has been sticking more to the wound and more difficult to change. He was referred to Dr. Marla Roe by Dr. Tommy Medal, but given difficulty of seeing the patient without a lift, patient has been referred to [facility] for further treatment. Dr. Marla Roe plans to evaluate patient tomorrow. [name] also reports leakage of fluid around patient's G-Tube, his stool contents have been more liquid than normal recently.   [name] denies any recent endorsements by the patient of fever, chills, nausea, vomiting, pain, worsening cough, or shortness of breath.    Past Medical History:  Diagnosis Date  . Anemia   . Anxiety   . Arthritis   . Asthma   .  Chronic osteomyelitis involving pelvic region and thigh ([place]) 06/18/2016  . Colostomy in place [facility])   . Complication of anesthesia    difficulty waking up, will have sudden drop in BP and O2 sats  . Depression   . Diabetic neuropathy ([place])   . Diabetic retinopathy ([place])   . Dysrhythmia    usually due to medications  . Family history of anesthesia complication    Pt mother can't have epidural procedures  . Fibromyalgia   . Gastroparesis   . GERD  (gastroesophageal reflux disease)   . H/O constipation   . H/O respiratory failure   . H/O thrombosis   . Hx MRSA infection    on face  . Multiple allergies 11/02/2015  . Pneumonia   . Recurrent UTI 11/02/2015  . Seizures ([place]) 2017   one time during admission in hosptital  . Stroke ([place]) 01/29/2013   spinal stroke ; paraplegic   . Syncope 02/16/2015  . Tracheostomy present ([place])   . Type I diabetes mellitus ([place])    sees Dr. Loanne Drilling     Allergies:  Allergies  Allergen Reactions  . Amikacin Sulfate Other (See Comments)    Seizure   . Cefuroxime Axetil Anaphylaxis    Tolerated cefepime 12/2015 - with Pepcid/Solu-Medrol  . Ertapenem Other (See Comments)    Rash and confusion-->tolerated Imipenem   . Morphine And Related Other (See Comments)    Changed mental status, confusion, headache, visual hallucination  . Nsaids Itching and Other (See Comments)    Risk of bleeding, itching  . Penicillins Anaphylaxis and Other (See Comments)    Tolerated Imipenem; no reaction to 7 day course of amoxicillin in 2015 Has patient had a PCN reaction causing immediate rash, facial/tongue/throat swelling, SOB or lightheadedness with hypotension: Yes Has patient had a PCN reaction causing severe rash involving mucus membranes or skin necrosis: No Has patient had a PCN reaction that required hospitalization Yes Has patient had a PCN reaction occurring within the last 10 years: No If all of the above answers are "NO", then may proceed with Cephalo  . Sulfa Antibiotics Anaphylaxis, Shortness Of Breath and Other (See Comments)  . Tessalon [Benzonatate] Anaphylaxis  . Levaquin [Levofloxacin In D5w] Swelling    Hand and forearm swelling  . Shellfish Allergy Itching and Other (See Comments)    Took benadryl to alleviate reaction  . Miripirium Rash and Other (See Comments)    Change in mental status    Current antibiotics: Vancomycin 10/17>> Imipenem-Cilastatin 10/17 >>  MEDICATIONS: . albuterol   2.5 mg Nebulization BID  . baclofen  20 mg Oral QID  . buPROPion  150 mg Oral Daily  . docusate  100 mg Oral Daily  . feeding supplement (GLUCERNA 1.2 CAL)  1,000 mL Per Tube Daily  . fentaNYL  75 mcg Transdermal Q72H  . free water  300-400 mL Per Tube Q8H  . heparin  5,000 Units Subcutaneous Q8H  . hydrocortisone  20 mg Oral q morning - 10a  . insulin aspart  0-5 Units Subcutaneous QHS  . insulin aspart  0-9 Units Subcutaneous TID WC  . insulin glargine  15 Units Subcutaneous QHS  . magnesium oxide  400 mg Oral BID  . NON FORMULARY 60 mg  60 mg PEG Tube Daily  . pregabalin  100 mg Oral TID  . sodium chloride flush  3 mL Intravenous Q12H  . [START ON 07/25/2016] vancomycin  750 mg Intravenous Q24H    Social History  Substance Use  Topics  . Smoking status: Former Smoker    Packs/day: 1.00    Years: 12.00    Types: Cigars, Cigarettes    Quit date: 09/07/2014  . Smokeless tobacco: Never Used  . Alcohol use No    Family History  Problem Relation Age of Onset  . Diabetes Father   . Hypertension Father   . Heart disease Mother     congenital - MVP  . Asthma      fhx  . Hypertension      fhx  . Stroke      fhx    Review of Systems: A complete ROS was negative except as per HPI.   OBJECTIVE: Vitals:   07/24/16 1545 07/24/16 1600 07/24/16 1615 07/24/16 1630  BP:    90/60  Pulse: 81 81 85 83  Resp: 15 23 25  (!) 27  Temp:    97.4 F (36.3 C)  TempSrc:    Axillary  SpO2: 98% 98% 99% 98%  Weight:      Height:       General: Vital signs reviewed.  Patient is chronically ill appearing, in no acute distress and cooperative with exam.  Eyes: PERRLA, conjunctivae normal, no scleral icterus.  Neck: Trach in place. Malodorous breath/sputum.  Cardiovascular: RRR Pulmonary/Chest: Coarse breath sounds throughout. No wheezing or rales.  Abdominal: Soft, non-tender, non-distended, BS +, G-Tube in place, contents leaking around tube, liquid brown-green stool in bag.    Extremities: No lower extremity edema bilaterally Neurological: Grimacing. Opens eyes and arouses to verbal cues. Follows some commands. Unable to answer yes/no questions. Skin: Large sacral decubitus ulcers with wound vac in place. No obvious drainage outside of the wound vac, but obvious malodorous smell from area.  Psychiatric: Unable to assess  LABS: Results for orders placed or performed during the hospital encounter of 07/24/16 (from the past 48 hour(s))  Ammonia     Status: None   Collection Time: 07/24/16  1:16 AM  Result Value Ref Range   Ammonia 25 9 - 35 umol/L  Comprehensive metabolic panel     Status: Abnormal   Collection Time: 07/24/16  1:16 AM  Result Value Ref Range   Sodium 127 (L) 135 - 145 mmol/L   Potassium 4.4 3.5 - 5.1 mmol/L   Chloride 100 (L) 101 - 111 mmol/L   CO2 17 (L) 22 - 32 mmol/L   Glucose, Bld 160 (H) 65 - 99 mg/dL   BUN 28 (H) 6 - 20 mg/dL   Creatinine, Ser 1.07 0.61 - 1.24 mg/dL   Calcium 8.4 (L) 8.9 - 10.3 mg/dL   Total Protein 6.7 6.5 - 8.1 g/dL   Albumin 1.7 (L) 3.5 - 5.0 g/dL   AST 17 15 - 41 U/L   ALT 16 (L) 17 - 63 U/L   Alkaline Phosphatase 173 (H) 38 - 126 U/L   Total Bilirubin 0.7 0.3 - 1.2 mg/dL   GFR calc non Af Amer >60 >60 mL/min   GFR calc Af Amer >60 >60 mL/min    Comment: (NOTE) The eGFR has been calculated using the CKD EPI equation. This calculation has not been validated in all clinical situations. eGFR's persistently <60 mL/min signify possible Chronic Kidney Disease.    Anion gap 10 5 - 15  CBC WITH DIFFERENTIAL     Status: Abnormal   Collection Time: 07/24/16  1:16 AM  Result Value Ref Range   WBC 40.0 (H) 4.0 - 10.5 K/uL   RBC 2.65 (L) 4.22 -  5.81 MIL/uL   Hemoglobin 6.6 (LL) 13.0 - 17.0 g/dL    Comment: REPEATED TO VERIFY CRITICAL RESULT CALLED TO, READ BACK BY AND VERIFIED WITH: PRUITT C RN AT 0231 ON 10.17.2017 BY COCHRANE S    HCT 21.5 (L) 39.0 - 52.0 %   MCV 81.1 78.0 - 100.0 fL   MCH 25.3 (L) 26.0 -  34.0 pg   MCHC 31.2 30.0 - 36.0 g/dL   RDW 15.1 11.5 - 15.5 %   Platelets 887 (H) 150 - 400 K/uL   Neutrophils Relative % 89 %   Lymphocytes Relative 4 %   Monocytes Relative 7 %   Eosinophils Relative 0 %   Basophils Relative 0 %   Neutro Abs 35.6 (H) 1.7 - 7.7 K/uL   Lymphs Abs 1.6 0.7 - 4.0 K/uL   Monocytes Absolute 2.8 (H) 0.1 - 1.0 K/uL   Eosinophils Absolute 0.0 0.0 - 0.7 K/uL   Basophils Absolute 0.0 0.0 - 0.1 K/uL   RBC Morphology POLYCHROMASIA PRESENT    WBC Morphology INCREASED BANDS (>20% BANDS)     Comment: ATYPICAL LYMPHOCYTES  Ethanol     Status: None   Collection Time: 07/24/16  1:16 AM  Result Value Ref Range   Alcohol, Ethyl (B) <5 <5 mg/dL    Comment:        LOWEST DETECTABLE LIMIT FOR SERUM ALCOHOL IS 5 mg/dL FOR MEDICAL PURPOSES ONLY   CBG monitoring, ED     Status: Abnormal   Collection Time: 07/24/16  1:31 AM  Result Value Ref Range   Glucose-Capillary 157 (H) 65 - 99 mg/dL  Rapid urine drug screen (hospital performed)     Status: Abnormal   Collection Time: 07/24/16  1:35 AM  Result Value Ref Range   Opiates POSITIVE (A) NONE DETECTED   Cocaine NONE DETECTED NONE DETECTED   Benzodiazepines NONE DETECTED NONE DETECTED   Amphetamines NONE DETECTED NONE DETECTED   Tetrahydrocannabinol NONE DETECTED NONE DETECTED   Barbiturates NONE DETECTED NONE DETECTED    Comment:        DRUG SCREEN FOR MEDICAL PURPOSES ONLY.  IF CONFIRMATION IS NEEDED FOR ANY PURPOSE, NOTIFY LAB WITHIN 5 DAYS.        LOWEST DETECTABLE LIMITS FOR URINE DRUG SCREEN Drug Class       Cutoff (ng/mL) Amphetamine      1000 Barbiturate      200 Benzodiazepine   681 Tricyclics       157 Opiates          300 Cocaine          300 THC              50   Urinalysis, Routine w reflex microscopic (not at [facility])     Status: Abnormal   Collection Time: 07/24/16  1:39 AM  Result Value Ref Range   Color, Urine AMBER (A) YELLOW    Comment: BIOCHEMICALS MAY BE AFFECTED BY COLOR    APPearance CLOUDY (A) CLEAR   Specific Gravity, Urine 1.015 1.005 - 1.030   pH 7.5 5.0 - 8.0   Glucose, UA NEGATIVE NEGATIVE mg/dL   Hgb urine dipstick SMALL (A) NEGATIVE   Bilirubin Urine SMALL (A) NEGATIVE   Ketones, ur 15 (A) NEGATIVE mg/dL   Protein, ur 100 (A) NEGATIVE mg/dL   Nitrite NEGATIVE NEGATIVE   Leukocytes, UA LARGE (A) NEGATIVE  Urine microscopic-add on     Status: Abnormal   Collection Time: 07/24/16  1:39 AM  Result Value Ref Range   Squamous Epithelial / LPF 0-5 (A) NONE SEEN   WBC, UA 6-30 0 - 5 WBC/hpf   RBC / HPF 0-5 0 - 5 RBC/hpf   Bacteria, UA MANY (A) NONE SEEN   Casts HYALINE CASTS (A) NEGATIVE   Urine-Other YEAST PRESENT   Blood culture (routine x 2)     Status: None (Preliminary result)   Collection Time: 07/24/16  2:00 AM  Result Value Ref Range   Specimen Description BLOOD RIGHT HAND    Special Requests IN PEDIATRIC BOTTLE 4ML    Culture  Setup Time      AEROBIC BOTTLE ONLY GRAM POSITIVE COCCI IN PAIRS CRITICAL RESULT CALLED TO, READ BACK BY AND VERIFIED WITH: A. MAYER, [place] ON 07/24/16 BY C. JESSUP, MLT.    Culture GRAM POSITIVE COCCI    Report Status PENDING   Blood Culture ID Panel (Reflexed)     Status: Abnormal   Collection Time: 07/24/16  2:00 AM  Result Value Ref Range   Enterococcus species NOT DETECTED NOT DETECTED   Listeria monocytogenes NOT DETECTED NOT DETECTED   Staphylococcus species NOT DETECTED NOT DETECTED   Staphylococcus aureus NOT DETECTED NOT DETECTED   Streptococcus species DETECTED (A) NOT DETECTED    Comment: CRITICAL RESULT CALLED TO, READ BACK BY AND VERIFIED WITH: A. MAYER, [place] ON 07/24/16 BY C. JESSUP, MLT.    Streptococcus agalactiae DETECTED (A) NOT DETECTED    Comment: CRITICAL RESULT CALLED TO, READ BACK BY AND VERIFIED WITH: A. MAYER, [place] ON 07/24/16 BY C. JESSUP, MLT.    Streptococcus pneumoniae NOT DETECTED NOT DETECTED   Streptococcus pyogenes NOT DETECTED NOT DETECTED    Acinetobacter baumannii NOT DETECTED NOT DETECTED   Enterobacteriaceae species NOT DETECTED NOT DETECTED   Enterobacter cloacae complex NOT DETECTED NOT DETECTED   Escherichia coli NOT DETECTED NOT DETECTED   Klebsiella oxytoca NOT DETECTED NOT DETECTED   Klebsiella pneumoniae NOT DETECTED NOT DETECTED   Proteus species NOT DETECTED NOT DETECTED   Serratia marcescens NOT DETECTED NOT DETECTED   Haemophilus influenzae NOT DETECTED NOT DETECTED   Neisseria meningitidis NOT DETECTED NOT DETECTED   Pseudomonas aeruginosa NOT DETECTED NOT DETECTED   Candida albicans NOT DETECTED NOT DETECTED   Candida glabrata NOT DETECTED NOT DETECTED   Candida krusei NOT DETECTED NOT DETECTED   Candida parapsilosis NOT DETECTED NOT DETECTED   Candida tropicalis NOT DETECTED NOT DETECTED  I-Stat Chem 8, ED     Status: Abnormal   Collection Time: 07/24/16  2:09 AM  Result Value Ref Range   Sodium 129 (L) 135 - 145 mmol/L   Potassium 4.5 3.5 - 5.1 mmol/L   Chloride 100 (L) 101 - 111 mmol/L   BUN 30 (H) 6 - 20 mg/dL   Creatinine, Ser 1.00 0.61 - 1.24 mg/dL   Glucose, Bld 157 (H) 65 - 99 mg/dL   Calcium, Ion 1.17 1.15 - 1.40 mmol/L   TCO2 18 0 - 100 mmol/L   Hemoglobin 8.2 (L) 13.0 - 17.0 g/dL   HCT 24.0 (L) 39.0 - 52.0 %  I-Stat CG4 Lactic Acid, ED     Status: None   Collection Time: 07/24/16  2:09 AM  Result Value Ref Range   Lactic Acid, Venous 0.52 0.5 - 1.9 mmol/L  I-Stat venous blood gas, ED     Status: Abnormal   Collection Time: 07/24/16  2:09 AM  Result Value Ref Range   pH, Ven  7.352 7.250 - 7.430   pCO2, Ven 28.5 (L) 44.0 - 60.0 mmHg   pO2, Ven 43.0 32.0 - 45.0 mmHg   Bicarbonate 15.8 (L) 20.0 - 28.0 mmol/L   TCO2 17 0 - 100 mmol/L   O2 Saturation 77.0 %   Acid-base deficit 9.0 (H) 0.0 - 2.0 mmol/L   Patient temperature HIDE    Sample type VENOUS   Blood culture (routine x 2)     Status: None (Preliminary result)   Collection Time: 07/24/16  4:05 AM  Result Value Ref Range    Specimen Description BLOOD RIGHT FOREARM    Special Requests AEROBIC BOTTLE ONLY 5ML    Culture NO GROWTH < 12 HOURS    Report Status PENDING   Type and screen     Status: None (Preliminary result)   Collection Time: 07/24/16  4:05 AM  Result Value Ref Range   ABO/RH(D) O POS    Antibody Screen NEG    Sample Expiration 07/27/2016    Unit Number [id number]    Blood Component Type RED CELLS,LR    Unit division 00    Status of Unit ISSUED    Transfusion Status OK TO TRANSFUSE    Crossmatch Result Compatible   Prepare RBC     Status: None   Collection Time: 07/24/16  4:05 AM  Result Value Ref Range   Order Confirmation ORDER PROCESSED BY BLOOD BANK   POC occult blood, ED     Status: None   Collection Time: 07/24/16  5:43 AM  Result Value Ref Range   Fecal Occult Bld NEGATIVE NEGATIVE  Cortisol-am, blood     Status: None   Collection Time: 07/24/16 12:12 PM  Result Value Ref Range   Cortisol - AM 19.0 6.7 - 22.6 ug/dL  Prealbumin     Status: Abnormal   Collection Time: 07/24/16 12:12 PM  Result Value Ref Range   Prealbumin <5 (L) 18 - 38 mg/dL  CBG monitoring, ED     Status: Abnormal   Collection Time: 07/24/16 12:14 PM  Result Value Ref Range   Glucose-Capillary 134 (H) 65 - 99 mg/dL  Culture, respiratory (NON-Expectorated)     Status: None (Preliminary result)   Collection Time: 07/24/16 12:25 PM  Result Value Ref Range   Specimen Description TRACHEAL ASPIRATE    Special Requests Normal    Gram Stain      MODERATE WBC PRESENT, PREDOMINANTLY PMN FEW GRAM POSITIVE COCCI IN PAIRS IN CLUSTERS RARE GRAM NEGATIVE COCCOBACILLI RARE GRAM NEGATIVE RODS RARE GRAM POSITIVE RODS    Culture PENDING    Report Status PENDING    *Note: Due to a large number of results and/or encounters for the requested time period, some results have not been displayed. A complete set of results can be found in Results Review.    MICRO: UCx 10/16: Pending BCx 10/16: Pending Respiratory Cx  10/16: Pending  IMAGING: Ct Head Wo Contrast  Result Date: 07/24/2016 CLINICAL DATA:  Initial evaluation for acute altered mental status. EXAM: CT HEAD WITHOUT CONTRAST TECHNIQUE: Contiguous axial images were obtained from the base of the skull through the vertex without intravenous contrast. COMPARISON:  Prior CT from 07/11/2015. FINDINGS: Brain: Cerebral volume within normal limits. No acute intracranial hemorrhage. No evidence for acute large vessel territory infarct. No mass lesion, midline shift or mass effect. No hydrocephalus. No extra-axial fluid collection. Vascular: No hyperdense vessel. Skull: Scalp soft tissues demonstrate no acute abnormality. Calvarium intact. Sinuses/Orbits: Globes and orbital soft tissues  within normal limits. Mild scattered mucosal thickening within the sphenoid sinuses. Paranasal sinuses are otherwise clear. No mastoid effusion. IMPRESSION: No acute intracranial process identified. Electronically Signed   By: Jeannine Boga M.D.   On: 07/24/2016 05:39   Dg Chest Port 1 View  Result Date: 07/24/2016 CLINICAL DATA:  Acute onset of altered mental status. Shortness of breath. Initial encounter. EXAM: PORTABLE CHEST 1 VIEW COMPARISON:  Chest radiograph performed 04/19/2016 FINDINGS: The patient's tracheostomy tube is seen ending 4 cm above the carina. Right basilar airspace opacity raises concern for pneumonia. Small bilateral pleural effusions are suspected. No pneumothorax is seen. The cardiomediastinal silhouette is borderline normal in size. No acute osseous abnormalities are identified. IMPRESSION: Right basilar airspace opacity is concerning for pneumonia. Suspect small bilateral pleural effusions. Electronically Signed   By: Garald Balding M.D.   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  Acute Encephalopathy: Improving. Concern for underlying infection given previous history of presentations with infections, leukocytosis of 40 with left shift and multiple risk factors for infection; however, afebrile. Work up so far has revealed a 1 view CXR with increased right basilar opacity concerning for pneumonia; however, not significantly different from prior CXR. UA shows many bacteria, large leukocytes, negative nitrites, and yeast; however, unreliable in the setting of suprapubic catheter. Patient also has a history of chronic sacral decubitus ulcers and osteomyelitis with a wound vac in place. MRI from 07/05/2016 showed mild marrow edema in the right posterior acetabulum with enhancement which may be reactive versus secondary to mild osteomyelitis. Unfortunately, no recent symptoms to support one cause of infection over another. Agree with current work up and antibiotic course. -Continue Vancomycin and Imipenem-Cilastin  -Pulmonary: Coarse breath sounds, CXR with possible pneumonia, respiratory culture pending, consider pulmonary consult for trach exchange (follows with Dr. Chase Caller and Marni Griffon) and 2 view CXR when able -Urinary: UCx pending, Urology to see patient for urostomy catheter exchange, consider larger catheter given leakage? -Chronic Decubitus Ulcers with Osteomyelitis: Wound vac in place, changed by mother/caretaker every 2-3 days, foul odor present, but no obvious drainage outside wound vac, plans for Dr. Marla Roe to evaluate tomorrow, wound care following, patient has been on chronic  doxycycline for tracheitis -GI: Recent increase in liquid stools, high WBC, some concern for C. Diff, but colonized with antigen positive, toxin negative C. Diff. Would hold off on testing and await blood culture results.  -BCx pending  Martyn Malay, DO PGY-3 Internal Medicine Resident Pager # [phone] 07/24/2016 5:21 PM

## 2016-07-24 NOTE — ED Notes (Signed)
Pharmacy contacted for info re peg administration.

## 2016-07-24 NOTE — ED Notes (Signed)
MD at bedside.Dr. Graylon Good aware of soft BP. Fluids I ncreased to 100cc/h per VO.

## 2016-07-24 NOTE — ED Triage Notes (Signed)
Pt brought in via ems from home with c/o altered mental status per mom(caretaker) she states that at approx 1600 yesterday she noticed that he "wasn't himself"  And was less verbal than usual. She states he is normally a&o x 4 but has had a hard time responding and is more lethargic than usual. Pt is a quadriplegic with trach, foley, g tub, ostomy, and decub ulcer to the sacrum/coccyx area that has a wound vac on it at this time. Per mom he is supposed to go to [facility] today for a consultation with the surgeon for possible closure of would.

## 2016-07-24 NOTE — H&P (Addendum)
History and Physical    [name] [id number] DOB: 01-04-1986 DOA: 07/24/2016  PCP: [name], MD Patient coming from: Home  Chief Complaint: confusion  HPI: Jason [name] is a 30 y.o. 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 Level V caveat applies as patient is unable to provide reliable history at this time. History provided by patient's mother who is his primary caretaker. Symptoms started around 1600 on 07/23/2016. Patient was essentially unresponsive for family when they tried to wake him up. When patient would awake he endorsed being scared and would cry and acted confused with regards to his surroundings. Mother states that this happens to him from time to time typically when there is an underlying infectious process ongoing. Last Foley change 2 weeks ago. Per family there've been no recent focal complaints.    ED Course: Objective findings outlined below. Patient started on vancomycin and cefepime and IVF on admission  Review of Systems: As per HPI otherwise 10 point review of systems negative.   Ambulatory Status: Bedbound, cared for at home on home health  Past Medical History:  Diagnosis Date  . Anemia   . Anxiety   . Arthritis   . Asthma   . Chronic osteomyelitis involving pelvic region and thigh ([place]) 06/18/2016  . Colostomy in place [facility])   . Complication of anesthesia    difficulty waking up, will have sudden drop in BP and O2 sats  . Depression   . Diabetic neuropathy ([place])   . Diabetic retinopathy ([place])   . Dysrhythmia    usually due to medications  . Family history of anesthesia complication      Pt mother can't have epidural procedures  . Fibromyalgia   . Gastroparesis   . GERD (gastroesophageal reflux disease)   . H/O constipation   . H/O respiratory failure   . H/O thrombosis   . Hx MRSA infection    on face  . Multiple allergies 11/02/2015  . Pneumonia   . Recurrent UTI 11/02/2015  . Seizures ([place]) 2017   one time during admission in hosptital  . Stroke ([place]) 01/29/2013   spinal stroke ; paraplegic   . Syncope 02/16/2015  . Tracheostomy present ([place])   . Type I diabetes mellitus ([place])    sees Dr. Loanne Drilling     Past Surgical History:  Procedure Laterality Date  . APPLICATION OF A-CELL OF BACK N/A 11/12/2014   Procedure: APPLICATION A CELL AND VAC ;  Surgeon: Theodoro Kos, DO;  Location: [place];  Service: Plastics;  Laterality: N/A;  . APPLICATION OF A-CELL OF BACK N/A 12/08/2014   Procedure: APPLICATION OF A-CELL AND WOUND VAC ;  Surgeon: Theodoro Kos, DO;  Location: [place];  Service: Plastics;  Laterality: N/A;  . APPLICATION OF A-CELL OF CHEST/ABDOMEN N/A 08/04/2015   Procedure: APPLICATION OF A-CELL ;  Surgeon: Loel Lofty Dillingham, DO;  Location: [place];  Service: Plastics;  Laterality: N/A;  . COLOSTOMY    . DEBRIDMENT OF DECUBITUS ULCER N/A 10/04/2014   Procedure: DEBRIDMENT OF DECUBITUS ULCER;  Surgeon: Georganna Skeans, MD;  Location: [place];  Service: General;  Laterality: N/A;  . GASTROSTOMY TUBE PLACEMENT    . HEMATOMA EVACUATION N/A 05/05/2015   Procedure: EVACUATION HEMATOMA  bedside procedure;  Surgeon: Theodoro Kos, DO;  Location: [place];  Service: Plastics;  Laterality: N/A;  . INCISION AND DRAINAGE OF WOUND N/A 11/12/2014   Procedure: IRRIGATION AND DEBRIDEMENT OF WOUNDS WITH BONE BIOPSY AND SURGICAL PREP ;  Surgeon: Theodoro Kos, DO;  Location: [place];  Service: Plastics;  Laterality: N/A;  . INCISION AND DRAINAGE OF WOUND N/A 11/18/2014   Procedure: IRRIGATION AND DEBRIDEMENT OF SACRAL ULCER ONLY WITH PLACEMENT OF A CELL AND VAC/ DRESSING CHANGE TO UPPER BACK  AREA.;  Surgeon: Theodoro Kos, DO;  Location: [place];  Service: Plastics;  Laterality: N/A;  . INCISION AND DRAINAGE OF WOUND N/A 11/25/2014   Procedure: IRRIGATION AND DEBRIDEMENT OF SACRAL ULCER AND BACK BURN WITH PLACEMENT OF A-CELL;  Surgeon: Theodoro Kos, DO;  Location: [place];  Service: Plastics;  Laterality: N/A;  . INCISION AND DRAINAGE OF WOUND Right 12/08/2014   Procedure: IRRIGATION AND DEBRIDEMENT SACRAL WOUND AND RIGHT ISCHIAL WOUND ;  Surgeon: Theodoro Kos, DO;  Location: [place];  Service: Plastics;  Laterality: Right;  . INCISION AND DRAINAGE OF WOUND Bilateral 05/05/2015   Procedure: IRRIGATION AND DEBRIDEMENT SACRAL ULCER;  Surgeon: Theodoro Kos, DO;  Location: [place];  Service: Plastics;  Laterality: Bilateral;  . INCISION AND DRAINAGE OF WOUND N/A 08/04/2015   Procedure: IRRIGATION AND DEBRIDEMENT OF SACRAL ULCER AND ;  Surgeon: Loel Lofty Dillingham, DO;  Location: [place];  Service: Plastics;  Laterality: N/A;  . INSERTION OF SUPRAPUBIC CATHETER N/A 10/12/2014   Procedure: INSERTION OF SUPRAPUBIC CATHETER;  Surgeon: Reece Packer, MD;  Location: [place];  Service: Urology;  Laterality: N/A;  . IR GENERIC HISTORICAL  07/11/2016   IR CM INJ ANY COLONIC TUBE W/FLUORO 07/11/2016 Arne Cleveland, MD [facility]-INTERV RAD  . LAPAROSCOPIC DIVERTED COLOSTOMY N/A 10/12/2014   Procedure: LAPAROSCOPIC DIVERTING COLOSTOMY;  Surgeon: Donnie Mesa, MD;  Location: [place];  Service: General;  Laterality: N/A;  . MINOR APPLICATION OF WOUND VAC N/A 11/25/2014   Procedure:  WOUND VAC CHANGE;  Surgeon: Theodoro Kos, DO;  Location: [place];  Service: Plastics;  Laterality: N/A;  . MULTIPLE EXTRACTIONS WITH ALVEOLOPLASTY N/A 08/03/2014   Procedure: MULTIPLE EXTRACTIONS;  Surgeon: Gae Bon, DDS;  Location: [place];  Service: Oral Surgery;  Laterality: N/A;  . RADIOLOGY WITH ANESTHESIA N/A 07/05/2016   Procedure: RADIOLOGY WITH ANESTHESIA MRI OF PELVIS WITH AND WITHOUT;  Surgeon: Medication Radiologist, MD;  Location:  [place];  Service: Radiology;  Laterality: N/A;  . TEE WITHOUT CARDIOVERSION N/A 08/17/2014   Procedure: TRANSESOPHAGEAL ECHOCARDIOGRAM (TEE);  Surgeon: Dorothy Spark, MD;  Location: [place];  Service: Cardiovascular;  Laterality: N/A;  . TEE WITHOUT CARDIOVERSION N/A 05/05/2015   Procedure: TRANSESOPHAGEAL ECHOCARDIOGRAM (TEE);  Surgeon: Lelon Perla, MD;  Location: [facility] ENDOSCOPY;  Service: Cardiovascular;  Laterality: N/A;  . TONSILLECTOMY      Social History   Social History  . Marital status: Single    Spouse name: N/A  . Number of children: 0  . Years of education: 46   Occupational History  . Disability    Social History Main Topics  . Smoking status: Former Smoker    Packs/day: 1.00    Years: 12.00    Types: Cigars, Cigarettes    Quit date: 09/07/2014  . Smokeless tobacco: Never Used  . Alcohol use No  . Drug use: No  . Sexual activity: Not Currently   Other Topics Concern  . Not on file   Social History Narrative  Patient lives at home with mother father.    Patient has 2 children.    Patient is single.    Patient was left hand but after stroke patient is now right handed.    Patient has  11th grade education.     Allergies  Allergen Reactions  . Amikacin Sulfate Other (See Comments)    Seizure   . Cefuroxime Axetil Anaphylaxis    Tolerated cefepime 12/2015 - with Pepcid/Solu-Medrol  . Ertapenem Other (See Comments)    Rash and confusion-->tolerated Imipenem   . Morphine And Related Other (See Comments)    Changed mental status, confusion, headache, visual hallucination  . Penicillins Anaphylaxis and Other (See Comments)    Tolerated Imipenem; no reaction to 7 day course of amoxicillin in 2015 Has patient had a PCN reaction causing immediate rash, facial/tongue/throat swelling, SOB or lightheadedness with hypotension: Yes Has patient had a PCN reaction causing severe rash involving mucus membranes or skin necrosis: No Has patient had a PCN  reaction that required hospitalization Yes Has patient had a PCN reaction occurring within the last 10 years: No If all of the above answers are "NO", then may proceed with Cephalo  . Sulfa Antibiotics Anaphylaxis, Shortness Of Breath and Other (See Comments)  . Tessalon [Benzonatate] Anaphylaxis  . Shellfish Allergy Itching and Other (See Comments)    Took benadryl to alleviate reaction  . Levaquin [Levofloxacin In D5w] Swelling    Hand and forearm swelling  . Nsaids Itching and Other (See Comments)    Risk of bleeding, itching  . Miripirium Rash and Other (See Comments)    Change in mental status    Family History  Problem Relation Age of Onset  . Diabetes Father   . Hypertension Father   . Heart disease Mother     congenital - MVP  . Asthma      fhx  . Hypertension      fhx  . Stroke      fhx    Prior to Admission medications   Medication Sig Start Date End Date Taking? Authorizing Provider  acetaminophen (TYLENOL) 325 MG tablet Take 2 tablets (650 mg total) by mouth every 6 (six) hours as needed for mild pain, moderate pain, fever or headache. 01/14/15  Yes Jessica U Vann, DO  albuterol (PROVENTIL) (2.5 MG/3ML) 0.083% nebulizer solution Take 3 mLs (2.5 mg total) by nebulization 4 (four) times daily. Patient taking differently: Take 2.5 mg by nebulization See admin instructions. Two to four times a day 07/03/16  Yes [name], MD  baclofen (LIORESAL) 20 MG tablet Take 1 tablet (20 mg total) by mouth 4 (four) times daily. 06/26/16  Yes [name], MD  BISACODYL 5 MG EC tablet Take ONE (1) tablet by mouth twice daily as needed FOR MODERATE CONSTIPATION 06/19/16  Yes [name], MD  buPROPion (WELLBUTRIN XL) 150 MG 24 hr tablet Take 1 tablet (150 mg total) by mouth daily. 06/15/16  Yes [name], MD  camphor-menthol [facility] lotion Apply topically as needed for itching. 09/02/15  Yes Shanker Kristeen Mans, MD  dexlansoprazole (DEXILANT) 60 MG capsule Take 1 capsule (60 mg  total) by mouth daily. 03/14/16  Yes [name], MD  docusate (COLACE) 50 MG/5ML liquid Take 10 mLs (100 mg total) by mouth 2 (two) times daily. Patient taking differently: Take 100 mg by mouth daily.  11/10/15  Yes [name], MD  doxycycline (VIBRA-TABS) 100 MG tablet Take 1 tablet (100 mg  total) by mouth 2 (two) times daily. Patient taking differently: Take 100 mg by mouth every evening.  02/07/16  Yes [name], MD  fentaNYL (DURAGESIC - DOSED MCG/HR) 75 MCG/HR place 1 patch (70mcg total) onto the skin every 3 (three) days 07/11/16  Yes [name], MD  ferrous sulfate 325 (65 FE) MG tablet Take 1 tablet (325 mg total) by mouth daily with breakfast. 09/27/15  Yes [name], MD  glucagon (GLUCAGON EMERGENCY) 1 MG injection INJECT INTO MUSCLE ONCE AS NEEDED 07/05/15  Yes Renato Shin, MD  GLUCERNA (GLUCERNA) LIQD 1,000 mLs by PEG Tube route continuous. 70 ml/hr   Yes Historical Provider, MD  glucose blood (ACCU-CHEK SMARTVIEW) test strip Use to check blood sugar 4 times per day. Dx Code: E11.9. Appointment needed for further refills. 12/13/15  Yes Renato Shin, MD  hydrocortisone (CORTEF) 10 MG tablet Take 1 tablet (10 mg total) by mouth every evening. 02/13/16  Yes [name], MD  hydrocortisone (CORTEF) 20 MG tablet Take 1 tablet (20 mg total) by mouth every morning. 02/13/16  Yes [name], MD  HYDROmorphone (DILAUDID) 8 MG tablet Take ONE (1) tablet (8mg  total) by mouth every 6 hours as needed for severe pain Patient taking differently: Take 8 mg by mouth every 6 (six) hours as needed for severe pain.  07/11/16  Yes [name], MD  insulin aspart (NOVOLOG FLEXPEN) 100 UNIT/ML FlexPen Inject 0-10 Units into the skin 4 (four) times daily as needed for high blood sugar (CBG >200). Per sliding scale: CBG <200 = 0 units, 201-300 = 4 units, 301-400 = 6 units, >400 = 10 units   Yes Historical Provider, MD  insulin glargine (LANTUS) 100 UNIT/ML injection Inject 0.15 mLs (15 Units total) into  the skin at bedtime. 08/16/15  Yes Janece Canterbury, MD  loratadine (CLARITIN) 10 MG tablet Take 1 tablet (10 mg total) by mouth daily. 11/25/15  Yes [name], MD  LORazepam (ATIVAN) 0.5 MG tablet Take 1 tablet (0.5 mg total) by mouth 3 (three) times daily as needed for anxiety. Reported on 11/02/2015 Patient taking differently: Take 0.25-0.5 mg by mouth 3 (three) times daily as needed for anxiety. Reported on 11/02/2015 06/20/16  Yes [name], MD  LYRICA 100 MG capsule Take ONE (1)  capsule by mouth three times daily (morning,afternoon and evening) and 2 capsules at bedtime Patient taking differently: Take 100 mg by mouth three times a day (morning/afternoon/evening) and 200 mg at bedtime 05/25/16  Yes [name], MD  Magnesium Oxide 400 (240 Mg) MG TABS Take 400 mg by mouth 2 (two) times daily. 07/09/16  Yes Historical Provider, MD  midodrine (PROAMATINE) 10 MG tablet Take 2 tablets (20 mg total) by mouth 3 (three) times daily with meals. Patient taking differently: Take 40 mg by mouth daily as needed.  10/28/15  Yes [name], MD  Multiple Vitamin (MULTIVITAMIN WITH MINERALS) TABS tablet Place 1 tablet into feeding tube daily. 06/01/15  Yes Erick Colace, NP  mupirocin ointment (BACTROBAN) 2 % Apply to affected area twice a day as needed 06/19/16  Yes [name], MD  ondansetron (ZOFRAN ODT) 8 MG disintegrating tablet Place 1 tablet (8 mg total) into feeding tube every 8 (eight) hours as needed for nausea or vomiting. 06/01/15  Yes Erick Colace, NP  Oxycodone HCl 20 MG TABS Take 1 tablet (20 mg total) by mouth every 6 (six) hours as needed (pain). 07/11/16  Yes [name], MD  SURE COMFORT INS SYR 1CC/29G 29G X 1/2" 1 ML MISC Use As Directed 01/23/16  Yes [name], MD  SURE COMFORT PEN NEEDLES 29G X 12.7MM MISC Use As Directed 03/12/16  Yes [name], MD  UNABLE TO FIND AQUACEL: Apply to feet and toes daily   Yes Historical Provider, MD  VOLTAREN 1 % GEL apply 2 grams  topically twice a day Patient taking differently: Apply 2 grams topically twice a day as needed for shoulder pain 04/24/16  Yes [name], MD  Water For Irrigation, Sterile (FREE WATER) SOLN Place 200 mLs into feeding tube every 8 (eight) hours. Patient taking differently: Place 300-400 mLs into feeding tube every 8 (eight) hours.  10/11/15  Yes [name], MD  ciprofloxacin (CIPRO) 500 MG tablet Take 1 tablet (500 mg total) by mouth 2 (two) times daily. Patient not taking: Reported on 07/24/2016 02/14/16   [name], MD  fluconazole (DIFLUCAN) 200 MG tablet Take 1 tablet (200 mg total) by mouth daily. Patient not taking: Reported on 07/24/2016 01/26/16   Cherene Altes, MD  Magnesium Oxide 400 MG CAPS Take 1 capsule (400 mg total) by mouth 2 (two) times daily. Patient not taking: Reported on 07/24/2016 09/27/15   [name], MD  nitrofurantoin, macrocrystal-monohydrate, (MACROBID) 100 MG capsule Take 1 capsule (100 mg total) by mouth 2 (two) times daily. Patient not taking: Reported on 07/24/2016 06/20/16   [name], MD  pantoprazole sodium (PROTONIX) 40 mg/20 mL PACK Place 20 mLs (40 mg total) into feeding tube daily. Patient not taking: Reported on 07/24/2016 06/01/15   Erick Colace, NP  saccharomyces boulardii (FLORASTOR) 250 MG capsule Take 1 capsule (250 mg total) by mouth 2 (two) times daily. Patient not taking: Reported on 07/24/2016 02/20/16   [name], MD  SANTYL ointment Apply 1 application topically every Monday, Wednesday, and Friday. Apply to necrotic tissue in the sacral wound only. Patient not taking: Reported on 07/24/2016 07/03/16   [name], MD    Physical Exam: Vitals:   07/24/16 0845 07/24/16 0900 07/24/16 0916 07/24/16 1117  BP: 111/74  102/71 112/79  Pulse: 75  77 79  Resp: 24  16 17   Temp:   97.2 F (36.2 C)   TempSrc:   Axillary   SpO2: 99% 99% 98% 98%  Weight:      Height:         General:  Appears calm and comfortable Eyes:  normal lids, ENT: dry mm, normal hearing, lips & tongue Neck:  no LAD, masses or thyromegaly Cardiovascular:  RRR, no m/r/g. No LE edema.  Respiratory: Diminished in the bases with crackles bilaterally. Normal effort Abdomen:  soft, ntnd, NABS, PEG tube, suprapubic catheter, colostomy in place and all well-appearing Skin:  no rash or induration seen on limited exam Musculoskeletal: Flaccid upper and lower extremities. No bony abnormalities appreciated Psychiatric/neurologic: Difficult to fully ascertain patient's condition given his current presentation of somnolence. Awakes to noxious stimuli, does not follow, no focal deficit   Labs on Admission: I have personally reviewed following labs and imaging studies  CBC:  Recent Labs Lab 07/24/16 0116 07/24/16 0209  WBC 40.0*  --   NEUTROABS 35.6*  --   HGB 6.6* 8.2*  HCT 21.5* 24.0*  MCV 81.1  --   PLT 887*  --    Basic Metabolic Panel:  Recent Labs Lab 07/24/16 0116 07/24/16 0209  NA  127* 129*  K 4.4 4.5  CL 100* 100*  CO2 17*  --   GLUCOSE 160* 157*  BUN 28* 30*  CREATININE 1.07 1.00  CALCIUM 8.4*  --    GFR: Estimated Creatinine Clearance: 88.5 mL/min (by C-G formula based on SCr of 1 mg/dL). Liver Function Tests:  Recent Labs Lab 07/24/16 0116  AST 17  ALT 16*  ALKPHOS 173*  BILITOT 0.7  PROT 6.7  ALBUMIN 1.7*   No results for input(s): LIPASE, AMYLASE in the last 168 hours.  Recent Labs Lab 07/24/16 0116  AMMONIA 25   Coagulation Profile: No results for input(s): INR, PROTIME in the last 168 hours. Cardiac Enzymes: No results for input(s): CKTOTAL, CKMB, CKMBINDEX, TROPONINI in the last 168 hours. BNP (last 3 results) No results for input(s): PROBNP in the last 8760 hours. HbA1C: No results for input(s): HGBA1C in the last 72 hours. CBG:  Recent Labs Lab 07/24/16 0131  GLUCAP 157*   Lipid Profile: No results for input(s): CHOL, HDL, LDLCALC, TRIG, CHOLHDL, LDLDIRECT in the last 72  hours. Thyroid Function Tests: No results for input(s): TSH, T4TOTAL, FREET4, T3FREE, THYROIDAB in the last 72 hours. Anemia Panel: No results for input(s): VITAMINB12, FOLATE, FERRITIN, TIBC, IRON, RETICCTPCT in the last 72 hours. Urine analysis:    Component Value Date/Time   COLORURINE AMBER (A) 07/24/2016 0139   APPEARANCEUR CLOUDY (A) 07/24/2016 0139   LABSPEC 1.015 07/24/2016 0139   PHURINE 7.5 07/24/2016 0139   GLUCOSEU NEGATIVE 07/24/2016 0139   HGBUR SMALL (A) 07/24/2016 0139   BILIRUBINUR SMALL (A) 07/24/2016 0139   BILIRUBINUR neg 06/28/2014 1445   KETONESUR 15 (A) 07/24/2016 0139   PROTEINUR 100 (A) 07/24/2016 0139   UROBILINOGEN 0.2 07/27/2015 0645   NITRITE NEGATIVE 07/24/2016 0139   LEUKOCYTESUR LARGE (A) 07/24/2016 0139    Creatinine Clearance: Estimated Creatinine Clearance: 88.5 mL/min (by C-G formula based on SCr of 1 mg/dL).  Sepsis Labs: @LABRCNTIP (procalcitonin:4,lacticidven:4) )No results found for this or any previous visit (from the past 240 hour(s)).   Radiological Exams on Admission: Ct Head Wo Contrast  Result Date: 07/24/2016 CLINICAL DATA:  Initial evaluation for acute altered mental status. EXAM: CT HEAD WITHOUT CONTRAST TECHNIQUE: Contiguous axial images were obtained from the base of the skull through the vertex without intravenous contrast. COMPARISON:  Prior CT from 07/11/2015. FINDINGS: Brain: Cerebral volume within normal limits. No acute intracranial hemorrhage. No evidence for acute large vessel territory infarct. No mass lesion, midline shift or mass effect. No hydrocephalus. No extra-axial fluid collection. Vascular: No hyperdense vessel. Skull: Scalp soft tissues demonstrate no acute abnormality. Calvarium intact. Sinuses/Orbits: Globes and orbital soft tissues within normal limits. Mild scattered mucosal thickening within the sphenoid sinuses. Paranasal sinuses are otherwise clear. No mastoid effusion. IMPRESSION: No acute intracranial  process identified. Electronically Signed   By: Jeannine Boga M.D.   On: 07/24/2016 05:39   Dg Chest Port 1 View  Result Date: 07/24/2016 CLINICAL DATA:  Acute onset of altered mental status. Shortness of breath. Initial encounter. EXAM: PORTABLE CHEST 1 VIEW COMPARISON:  Chest radiograph performed 04/19/2016 FINDINGS: The patient's tracheostomy tube is seen ending 4 cm above the carina. Right basilar airspace opacity raises concern for pneumonia. Small bilateral pleural effusions are suspected. No pneumothorax is seen. The cardiomediastinal silhouette is borderline normal in size. No acute osseous abnormalities are identified. IMPRESSION: Right basilar airspace opacity is concerning for pneumonia. Suspect small bilateral pleural effusions. Electronically Signed   By: Francoise Schaumann.D.  On: 07/24/2016 02:09    EKG: Independently reviewed.  Assessment/Plan Active Problems:   SIRS (systemic inflammatory response syndrome) ([name])   Suprapubic catheter ([name])   History of MDR Pseudomonas aeruginosa infection   Lower urinary tract infectious disease   Hyponatremia   Diabetes mellitus type 1 with complications ([name])   Fibromyalgia   Decubitus ulcer   Hypotension   HCAP (healthcare-associated pneumonia)   Encephalopathy acute   Osteomyelitis ([name])   Functional quadriplegia ([name])   Adrenal insufficiency ([name])   GERD (gastroesophageal reflux disease)   Acute encephalopathy: likely from infectious etiology -Dx including H CAP, chronic sacral decubitus ulcers with osteomyelitis, UTI. No recent medication changes, significant metabolic amount is, recent seizures. Chest x-ray concerning for right lower lobe pneumonia, UA grossly abnormal the patient is chronically cath, WBC 40 with left shift, afebrile. - Vancomycin and imipenem (confirmed antibiotic regimen with ID) - Blood culture, urine culture - ID consult pending - Treatment of HC AP, acute as ulcers, UTI outlined below - If not  improving consider CT head - patient very difficult to image and typically requires heavy sedation/anesthesia  SIRS/HCAP: Bacterial versus viral etiology. Trach dependent. Of note patient is currently on doxycycline for tracheitis - DC Doxy - Pneumonia order set - Vancomycin and imipenem  UTI: Suprapubic cath in place in order to keep chronic sacral decubitus ulcers clean and dry. Urinalysis chronically abnormal. Last catheter change 1-1/2 weeks ago. Per report by family catheter is due for change. Bladder scan showing 65 ML's currently and bladder with mild leakage of urine around the urostomy site - Antibiotics as above - UCX - Urostomy catheter exchange ordered - Urology consulted and will see patient. Appreciate their input  Sacral decubitus ulcers/osteomyelitis: Chronic. Wound VAC in place for several weeks. Family endorses seeing Dr. Marla Roe. I have called her office and spoken to support staff directly about patient he stated they have not seen this patient in the clinic in years. They have seen the patient in the hospital from time to time. It appears that patient was scheduled to start with a new wound care team at [place], Dr. Norberto Sorenson, [place] [phone]). MRI under anesthesia performed on 07/05/2016 showing "Mild marrow edema in the right posterior acetabulum with enhancement which may be reactive versus secondary to mild osteomyelitis." - Dr Marla [place] to see pt in consultation 07-25-16 - Continue wound VAC - Antibiotics as above - Consider repeat MRI, will defer to ID. MRI is very difficult for patient as it requires significant sedation -Air overlay mattress - Continue wound care, to include collagenase ointment Monday Wednesday Friday,   Anemia: Initial CBC showing hemoglobin of 6.6. Repeat labs showing 8.2. Patient's baseline around 9. No reported GI loss. Suspect combination of lab air and ongoing chronic disease and malnutrition. 1 unit PRBC ordered by EDP - Follow-up  CBC - FOBT from stoma  Functional quadriplegia: Secondary to C6 ischemic injury several years ago. - PT/OT  Hyponatremia: 129. Chronic. - Normal saline IVF  Adrenal insufficiency: No evidence of being in crisis. -Continue hydrocortisone - am cortisol  Hypotension: Likely secondary to adrenal insufficiency - Continue Midodrine - IVF support as needed  Diabetes: Type 1. - Continue to Lantus daily at bedtime - SSI  Nutrition: Per report by mother/caregiver patient recently began eating full meals, requiring less PEG tube utilization. Currently patient is obtunded. - Nutrition consult for PEG tube nutrition - Allow oral intake once patient's mental status returns to baseline  Chronic pain: Secondary to chronic wounds  and muscular skeletal pain from contractures secondary to paralysis. - Continue fentanyl patch, oxycodone, Baclofen, Lyrica, Voltaren gel  GERD: - Continue the Dexilant  Depression/Anxiety: - Continue Ativan, Wellbutrin  Chronic constipation: - Continue bisacodyl,    DVT prophylaxis: Hep  Code Status: Full  Family Communication: Mother  Disposition Plan: Pending improvement in mental status and further workup of possible osteomyelitis and chronic wound management  Consults called: ID, plastic surgery, urology  Admission status: Inpatient    Jouri Threat J MD [facility]  If 7PM-7AM, please contact night-coverage [email or web address] Password [email or web address]  07/24/2016, 11:25 AM

## 2016-07-24 NOTE — ED Notes (Signed)
Blood increased to 150cc/h.

## 2016-07-24 NOTE — Progress Notes (Signed)
This is a no charge note  Pending admission per Dr. Tyrone Nine  30 year old man with past medical history of diabetes mellitus, stroke, seizure, quadriplegic with trach, foley, G tub, ostomy, and decub ulcer, anemia, recurrent UTI, fibromyalgia, gastroparesis, GERD, depression, anxiety, asthma, who presents with altered mental status.   Pt is less verbal and more confused. Pt was found to have possible right basilar infiltration. UA is positive which is possibly due to colonization. WBC 40.0, hemoglobin dropped from 9.8 on 07/04/16-->6.6, sodium 127, creatinine 1.0, lactate 0.52. Pt is accepted to tele bed as inpt. IV vancomycin and cefepime were started. pendiong CT-head and FOBT. 1 unit of blood was ordered.  Ivor Costa, MD  [facility] Pager [phone]  If 7PM-7AM, please contact night-coverage [email or web address] Password [email or web address] 07/24/2016, 3:35 AM

## 2016-07-24 NOTE — ED Notes (Signed)
CBG 134 

## 2016-07-24 NOTE — ED Notes (Signed)
Pt suctioned of thick yellow phlegm. Pt bats hand away while suctioning but then lets me complete. Pt responds to questions with nods and following eyes.

## 2016-07-24 NOTE — Consult Note (Addendum)
[place] Nurse wound consult note Reason for Consult:Consult requested for Vac dressing; pt is in the ER and has a negative pressure device from home which has lost power.  Pt is familiar to [place] from multiple previous admissions and mother is at the bedside.  She is very well-informed regarding woundcare.   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  Attached [employer] negative pressure Vac device and mother states she will take the other machine home. Suction was not obtained, so pt was turned and new track pad was applied.  It was noted that drape had become saturated when the other machine lost suction, and is not adhering over the lower edge of the wound related to moisture.  Changed pads and bedding and applied barrier ring around lower edge of wound and additional drape applied to maintain seal. Cont suction on at 122mm afterwards with good seal.   Pressure Ulcer POA: Yes Ostomy pouch intact with good seal; stoma red and viable when visualized through pouch, mod amt brown liquid stool.   Posterior back with full thickness wound; 2X2X.2cm, red and moist, small amt yellow drainage, no odor. Pt has Prevalon boots on bilat to reduce pressure.  Mother requests that leg and foot wounds be assessed tomorrow when the Vac dressing is changed.  Dressing procedure/placement/frequency: WOC will plan to change Vac dressing tomorrow as requested and assess leg wounds at that time.  Air mattress ordered to reduce pressure. More than one hour was spent performing this consult. Julien Girt MSN, RN, [place], [place], [place]

## 2016-07-24 NOTE — ED Notes (Addendum)
Pt's mother called out, stated that pt needed to be suctioned - purulent appearing drainage leaking from trach; RN notified

## 2016-07-24 NOTE — ED Notes (Signed)
Pt received lying in bed. Mother at bedside. Pt sleeping with even respirations.

## 2016-07-24 NOTE — Consult Note (Signed)
Reason for Consult: Hyper-reflexic Neurogenic Bladder  Referring Physician: David Merrell MD  Jason [name] is an 30 y.o. male.   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 20F silicone changed Q monthly by HH RN. Family reports leakage around catheter and wants upsized. He is on baclofen at baseline but no GU antispasmodics. SPT otherwise working well.   Today "Jason [name]" is seen in consultation for above. He is being admitted with suspect pneumonia / altered mental status and apparently had Urol appt pending.    Past Medical History:  Diagnosis Date  . Anemia   . Anxiety   . Arthritis   . Asthma   . 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 Colostomy in place ([name])   . Complication of anesthesia    difficulty waking up, will have sudden drop in BP and O2 sats  . Depression   . Diabetic neuropathy ([name])   . Diabetic retinopathy ([name])   . Dysrhythmia    usually due to medications  . Family history of anesthesia complication    Pt mother can't have epidural procedures  . Fibromyalgia   . Gastroparesis   . GERD (gastroesophageal reflux disease)   . H/O constipation   . H/O respiratory failure   . H/O thrombosis   . Hx MRSA infection    on face  . Multiple allergies 11/02/2015  . Pneumonia   . Recurrent UTI 11/02/2015  . Seizures ([name]) 2017   one time during admission in hosptital  . Stroke ([name]) 01/29/2013   spinal stroke ; paraplegic   . Syncope 02/16/2015  . Tracheostomy present ([name])   . Type I diabetes mellitus ([name])    sees Dr. Ellison     Past Surgical History:  Procedure Laterality Date  . APPLICATION OF A-CELL OF BACK N/A 11/12/2014   Procedure: APPLICATION A CELL AND VAC ;  Surgeon: Claire Sanger, DO;  Location: [facility];  Service: Plastics;  Laterality: N/A;  . APPLICATION OF A-CELL OF BACK N/A 12/08/2014   Procedure: APPLICATION OF A-CELL AND WOUND VAC ;   Surgeon: Claire Sanger, DO;  Location: [facility];  Service: Plastics;  Laterality: N/A;  . APPLICATION OF A-CELL OF CHEST/ABDOMEN N/A 08/04/2015   Procedure: APPLICATION OF A-CELL ;  Surgeon: Claire S Dillingham, DO;  [facility]: [facility];  Service: Plastics;  Laterality: N/A;  . COLOSTOMY    . DEBRIDMENT OF DECUBITUS ULCER N/A 10/04/2014   Procedure: DEBRIDMENT OF DECUBITUS ULCER;  Surgeon: Burke Thompson, MD;  [facility]: [facility];  Service: General;  Laterality: N/A;  . GASTROSTOMY TUBE PLACEMENT    . HEMATOMA EVACUATION N/A 05/05/2015   Procedure: EVACUATION HEMATOMA bedside procedure;  Surgeon: Claire Sanger, DO;  [facility]: [facility];  Service: Plastics;  Laterality: N/A;  . INCISION AND DRAINAGE OF WOUND N/A 11/12/2014   Procedure: IRRIGATION AND DEBRIDEMENT OF WOUNDS WITH BONE BIOPSY AND SURGICAL PREP ;  Surgeon: Claire Sanger, DO;  [facility]: [facility];  Service: Plastics;  Laterality: N/A;  . INCISION AND DRAINAGE OF WOUND N/A 11/18/2014   Procedure: IRRIGATION AND DEBRIDEMENT OF SACRAL ULCER ONLY WITH PLACEMENT OF A CELL AND VAC/ DRESSING CHANGE TO UPPER BACK AREA.;  Surgeon: Claire Sanger, DO;  [facility];  Service: Plastics;  Laterality: N/A;  . INCISION AND DRAINAGE OF WOUND N/A 11/25/2014   Procedure: IRRIGATION AND DEBRIDEMENT OF SACRAL ULCER AND BACK BURN WITH PLACEMENT OF A-CELL;  Surgeon: Claire Sanger,   DO;  [facility];  Service: Plastics;  Laterality: N/A;  . INCISION AND DRAINAGE OF WOUND Right 12/08/2014   Procedure: IRRIGATION AND DEBRIDEMENT SACRAL WOUND AND RIGHT ISCHIAL WOUND ;  Surgeon: Claire Sanger, DO;  [facility];  Service: Plastics;  Laterality: Right;  . INCISION AND DRAINAGE OF WOUND Bilateral 05/05/2015   Procedure: IRRIGATION AND DEBRIDEMENT SACRAL ULCER;  Surgeon: Claire Sanger, DO;  [facility];  Service: Plastics;  Laterality: Bilateral;  . INCISION AND DRAINAGE OF WOUND N/A 08/04/2015   Procedure: IRRIGATION AND DEBRIDEMENT OF SACRAL ULCER AND ;  Surgeon: Claire S  Dillingham, DO;  [facility];  Service: Plastics;  Laterality: N/A;  . INSERTION OF SUPRAPUBIC CATHETER N/A 10/12/2014   Procedure: INSERTION OF SUPRAPUBIC CATHETER;  Surgeon: Scott A MacDiarmid, MD;  [facility];  Service: Urology;  Laterality: N/A;  . IR GENERIC HISTORICAL  07/11/2016   IR CM INJ ANY COLONIC TUBE W/FLUORO 07/11/2016 Daniel Hassell, MD [facility]INTER[facility] RAD  . LAPAROSCOPIC DIVERTED COLOSTOMY N/A 10/12/2014   Procedure: LAPAROSCOPIC DIVERTING COLOSTOMY;  Surgeon: Matthew Tsuei, MD;  [facility];  Service: General;  Laterality: N/A;  . MINOR APPLICATION OF WOUND VAC N/A 11/25/2014   Procedure:  WOUND VAC CHANGE;  Surgeon: Claire Sanger, DO;  [facility];  Service: Plastics;  Laterality: N/A;  . MULTIPLE EXTRACTIONS WITH ALVEOLOPLASTY N/A 08/03/2014   Procedure: MULTIPLE EXTRACTIONS;  Surgeon: Scott M Jensen, DDS;  [facility];  Service: Oral Surgery;  Laterality: N/A;  . RADIOLOGY WITH ANESTHESIA N/A 07/05/2016   Procedure: RADIOLOGY WITH ANESTHESIA MRI OF PELVIS WITH AND WITHOUT;  Surgeon: Medication Radiologist, MD;  [facility];  Service: Radiology;  Laterality: N/A;  . TEE WITHOUT CARDIOVERSION N/A 08/17/2014   Procedure: TRANSESOPHAGEAL ECHOCARDIOGRAM (TEE);  Surgeon: Katarina H Nelson, MD;  [facility];  Service: Cardiovascular;  Laterality: N/A;  . TEE WITHOUT CARDIOVERSION N/A 05/05/2015   Procedure: TRANSESOPHAGEAL ECHOCARDIOGRAM (TEE);  Surgeon: Brian S Crenshaw, MD;  [facility];  Service: Cardiovascular;  Laterality: N/A;  . TONSILLECTOMY      Family History  Problem Relation Age of Onset  . Diabetes Father   . Hypertension Father   . Heart disease Mother     congenital - MVP  . Asthma      fhx  . Hypertension      fhx  . Stroke      fhx    Social History:  reports that he quit smoking about 22 months ago. His smoking use included Cigars and Cigarettes. He has a 12.00 pack-year smoking history. He has never used smokeless  tobacco. He reports that he does not drink alcohol or use drugs.  Allergies:  Allergies  Allergen Reactions  . Amikacin Sulfate Other (See Comments)    Seizure   . Cefuroxime Axetil Anaphylaxis    Tolerated cefepime 12/2015 - with Pepcid/Solu-Medrol  . Ertapenem Other (See Comments)    Rash and confusion-->tolerated Imipenem   . Morphine And Related Other (See Comments)    Changed mental status, confusion, headache, visual hallucination  . Nsaids Itching and Other (See Comments)    Risk of bleeding, itching  . Penicillins Anaphylaxis and Other (See Comments)    Tolerated Imipenem; no reaction to 7 day course of amoxicillin in 2015 Has patient had a PCN reaction causing immediate rash, facial/tongue/throat swelling, SOB or lightheadedness with hypotension: Yes Has patient had a PCN reaction causing severe rash involving mucus membranes or skin necrosis: No Has   patient had a PCN reaction that required hospitalization Yes Has patient had a PCN reaction occurring within the last 10 years: No If all of the above answers are "NO", then may proceed with Cephalo  . Sulfa Antibiotics Anaphylaxis, Shortness Of Breath and Other (See Comments)  . Tessalon [Benzonatate] Anaphylaxis  . Levaquin [Levofloxacin In D5w] Swelling    Hand and forearm swelling  . Shellfish Allergy Itching and Other (See Comments)    Took benadryl to alleviate reaction  . Miripirium Rash and Other (See Comments)    Change in mental status    Medications: I have reviewed the patient's current medications.  Results for orders placed or performed during the hospital encounter of 07/24/16 (from the past 48 hour(s))  Ammonia     Status: None   Collection Time: 07/24/16  1:16 AM  Result Value Ref Range   Ammonia 25 9 - 35 umol/L  Comprehensive metabolic panel     Status: Abnormal   Collection Time: 07/24/16  1:16 AM  Result Value Ref Range   Sodium 127 (L) 135 - 145 mmol/L   Potassium 4.4 3.5 - 5.1 mmol/L   Chloride  100 (L) 101 - 111 mmol/L   CO2 17 (L) 22 - 32 mmol/L   Glucose, Bld 160 (H) 65 - 99 mg/dL   BUN 28 (H) 6 - 20 mg/dL   Creatinine, Ser 1.07 0.61 - 1.24 mg/dL   Calcium 8.4 (L) 8.9 - 10.3 mg/dL   Total Protein 6.7 6.5 - 8.1 g/dL   Albumin 1.7 (L) 3.5 - 5.0 g/dL   AST 17 15 - 41 U/L   ALT 16 (L) 17 - 63 U/L   Alkaline Phosphatase 173 (H) 38 - 126 U/L   Total Bilirubin 0.7 0.3 - 1.2 mg/dL   GFR calc non Af Amer >60 >60 mL/min   GFR calc Af Amer >60 >60 mL/min    Comment: (NOTE) The eGFR has been calculated using the CKD EPI equation. This calculation has not been validated in all clinical situations. eGFR's persistently <60 mL/min signify possible Chronic Kidney Disease.    Anion gap 10 5 - 15  CBC WITH DIFFERENTIAL     Status: Abnormal   Collection Time: 07/24/16  1:16 AM  Result Value Ref Range   WBC 40.0 (H) 4.0 - 10.5 K/uL   RBC 2.65 (L) 4.22 - 5.81 MIL/uL   Hemoglobin 6.6 (LL) 13.0 - 17.0 g/dL    Comment: REPEATED TO VERIFY CRITICAL RESULT CALLED TO, READ BACK BY AND VERIFIED WITH: PRUITT C RN AT 0231 ON 10.17.2017 BY COCHRANE S    HCT 21.5 (L) 39.0 - 52.0 %   MCV 81.1 78.0 - 100.0 fL   MCH 25.3 (L) 26.0 - 34.0 pg   MCHC 31.2 30.0 - 36.0 g/dL   RDW 15.1 11.5 - 15.5 %   Platelets 887 (H) 150 - 400 K/uL   Neutrophils Relative % 89 %   Lymphocytes Relative 4 %   Monocytes Relative 7 %   Eosinophils Relative 0 %   Basophils Relative 0 %   Neutro Abs 35.6 (H) 1.7 - 7.7 K/uL   Lymphs Abs 1.6 0.7 - 4.0 K/uL   Monocytes Absolute 2.8 (H) 0.1 - 1.0 K/uL   Eosinophils Absolute 0.0 0.0 - 0.7 K/uL   Basophils Absolute 0.0 0.0 - 0.1 K/uL   RBC Morphology POLYCHROMASIA PRESENT    WBC Morphology INCREASED BANDS (>20% BANDS)     Comment: ATYPICAL LYMPHOCYTES  Ethanol  Status: None   Collection Time: 07/24/16  1:16 AM  Result Value Ref Range   Alcohol, Ethyl (B) <5 <5 mg/dL    Comment:        LOWEST DETECTABLE LIMIT FOR SERUM ALCOHOL IS 5 mg/dL FOR MEDICAL PURPOSES ONLY    CBG monitoring, ED     Status: Abnormal   Collection Time: 07/24/16  1:31 AM  Result Value Ref Range   Glucose-Capillary 157 (H) 65 - 99 mg/dL  Rapid urine drug screen (hospital performed)     Status: Abnormal   Collection Time: 07/24/16  1:35 AM  Result Value Ref Range   Opiates POSITIVE (A) NONE DETECTED   Cocaine NONE DETECTED NONE DETECTED   Benzodiazepines NONE DETECTED NONE DETECTED   Amphetamines NONE DETECTED NONE DETECTED   Tetrahydrocannabinol NONE DETECTED NONE DETECTED   Barbiturates NONE DETECTED NONE DETECTED    Comment:        DRUG SCREEN FOR MEDICAL PURPOSES ONLY.  IF CONFIRMATION IS NEEDED FOR ANY PURPOSE, NOTIFY LAB WITHIN 5 DAYS.        LOWEST DETECTABLE LIMITS FOR URINE DRUG SCREEN Drug Class       Cutoff (ng/mL) Amphetamine      1000 Barbiturate      200 Benzodiazepine   200 Tricyclics       300 Opiates          300 Cocaine          300 THC              50   Urinalysis, Routine w reflex microscopic (not at [facility])     Status: Abnormal   Collection Time: 07/24/16  1:39 AM  Result Value Ref Range   Color, Urine AMBER (A) YELLOW    Comment: BIOCHEMICALS MAY BE AFFECTED BY COLOR   APPearance CLOUDY (A) CLEAR   Specific Gravity, Urine 1.015 1.005 - 1.030   pH 7.5 5.0 - 8.0   Glucose, UA NEGATIVE NEGATIVE mg/dL   Hgb urine dipstick SMALL (A) NEGATIVE   Bilirubin Urine SMALL (A) NEGATIVE   Ketones, ur 15 (A) NEGATIVE mg/dL   Protein, ur 100 (A) NEGATIVE mg/dL   Nitrite NEGATIVE NEGATIVE   Leukocytes, UA LARGE (A) NEGATIVE  Urine microscopic-add on     Status: Abnormal   Collection Time: 07/24/16  1:39 AM  Result Value Ref Range   Squamous Epithelial / LPF 0-5 (A) NONE SEEN   WBC, UA 6-30 0 - 5 WBC/hpf   RBC / HPF 0-5 0 - 5 RBC/hpf   Bacteria, UA MANY (A) NONE SEEN   Casts HYALINE CASTS (A) NEGATIVE   Urine-Other YEAST PRESENT   Blood culture (routine x 2)     Status: None (Preliminary result)   Collection Time: 07/24/16  2:00 AM  Result Value Ref  Range   Specimen Description BLOOD RIGHT HAND    Special Requests IN PEDIATRIC BOTTLE 4ML    Culture  Setup Time      AEROBIC BOTTLE ONLY GRAM POSITIVE COCCI IN PAIRS CRITICAL RESULT CALLED TO, READ BACK BY AND VERIFIED WITH: A. MAYER, PHARMD AT 1715 ON 07/24/16 BY C. JESSUP, MLT.    Culture GRAM POSITIVE COCCI    Report Status PENDING   Blood Culture ID Panel (Reflexed)     Status: Abnormal   Collection Time: 07/24/16  2:00 AM  Result Value Ref Range   Enterococcus species NOT DETECTED NOT DETECTED   Listeria monocytogenes NOT DETECTED NOT DETECTED   Staphylococcus   species NOT DETECTED NOT DETECTED   Staphylococcus aureus NOT DETECTED NOT DETECTED   Streptococcus species DETECTED (A) NOT DETECTED    Comment: CRITICAL RESULT CALLED TO, READ BACK BY AND VERIFIED WITH: A. MAYER, PHARMD AT 1715 ON 07/24/16 BY C. JESSUP, MLT.    Streptococcus agalactiae DETECTED (A) NOT DETECTED    Comment: CRITICAL RESULT CALLED TO, READ BACK BY AND VERIFIED WITH: A. MAYER, PHARMD AT 1715 ON 07/24/16 BY C. JESSUP, MLT.    Streptococcus pneumoniae NOT DETECTED NOT DETECTED   Streptococcus pyogenes NOT DETECTED NOT DETECTED   Acinetobacter baumannii NOT DETECTED NOT DETECTED   Enterobacteriaceae species NOT DETECTED NOT DETECTED   Enterobacter cloacae complex NOT DETECTED NOT DETECTED   Escherichia coli NOT DETECTED NOT DETECTED   Klebsiella oxytoca NOT DETECTED NOT DETECTED   Klebsiella pneumoniae NOT DETECTED NOT DETECTED   Proteus species NOT DETECTED NOT DETECTED   Serratia marcescens NOT DETECTED NOT DETECTED   Haemophilus influenzae NOT DETECTED NOT DETECTED   Neisseria meningitidis NOT DETECTED NOT DETECTED   Pseudomonas aeruginosa NOT DETECTED NOT DETECTED   Candida albicans NOT DETECTED NOT DETECTED   Candida glabrata NOT DETECTED NOT DETECTED   Candida krusei NOT DETECTED NOT DETECTED   Candida parapsilosis NOT DETECTED NOT DETECTED   Candida tropicalis NOT DETECTED NOT DETECTED   I-Stat Chem 8, ED     Status: Abnormal   Collection Time: 07/24/16  2:09 AM  Result Value Ref Range   Sodium 129 (L) 135 - 145 mmol/L   Potassium 4.5 3.5 - 5.1 mmol/L   Chloride 100 (L) 101 - 111 mmol/L   BUN 30 (H) 6 - 20 mg/dL   Creatinine, Ser 1.00 0.61 - 1.24 mg/dL   Glucose, Bld 157 (H) 65 - 99 mg/dL   Calcium, Ion 1.17 1.15 - 1.40 mmol/L   TCO2 18 0 - 100 mmol/L   Hemoglobin 8.2 (L) 13.0 - 17.0 g/dL   HCT 24.0 (L) 39.0 - 52.0 %  I-Stat CG4 Lactic Acid, ED     Status: None   Collection Time: 07/24/16  2:09 AM  Result Value Ref Range   Lactic Acid, Venous 0.52 0.5 - 1.9 mmol/L  I-Stat venous blood gas, ED     Status: Abnormal   Collection Time: 07/24/16  2:09 AM  Result Value Ref Range   pH, Ven 7.352 7.250 - 7.430   pCO2, Ven 28.5 (L) 44.0 - 60.0 mmHg   pO2, Ven 43.0 32.0 - 45.0 mmHg   Bicarbonate 15.8 (L) 20.0 - 28.0 mmol/L   TCO2 17 0 - 100 mmol/L   O2 Saturation 77.0 %   Acid-base deficit 9.0 (H) 0.0 - 2.0 mmol/L   Patient temperature HIDE    Sample type VENOUS   Blood culture (routine x 2)     Status: None (Preliminary result)   Collection Time: 07/24/16  4:05 AM  Result Value Ref Range   Specimen Description BLOOD RIGHT FOREARM    Special Requests AEROBIC BOTTLE ONLY 5ML    Culture NO GROWTH < 12 HOURS    Report Status PENDING   Type and screen     Status: None (Preliminary result)   Collection Time: 07/24/16  4:05 AM  Result Value Ref Range   ABO/RH(D) O POS    Antibody Screen NEG    Sample Expiration 07/27/2016    Unit Number [id number]    Blood Component Type RED CELLS,LR    Unit division 00    Status of Unit ISSUED      Transfusion Status OK TO TRANSFUSE    Crossmatch Result Compatible   Prepare RBC     Status: None   Collection Time: 07/24/16  4:05 AM  Result Value Ref Range   Order Confirmation ORDER PROCESSED BY BLOOD BANK   POC occult blood, ED     Status: None   Collection Time: 07/24/16  5:43 AM  Result Value Ref Range   Fecal Occult Bld  NEGATIVE NEGATIVE  Cortisol-am, blood     Status: None   Collection Time: 07/24/16 12:12 PM  Result Value Ref Range   Cortisol - AM 19.0 6.7 - 22.6 ug/dL  Prealbumin     Status: Abnormal   Collection Time: 07/24/16 12:12 PM  Result Value Ref Range   Prealbumin <5 (L) 18 - 38 mg/dL  CBG monitoring, ED     Status: Abnormal   Collection Time: 07/24/16 12:14 PM  Result Value Ref Range   Glucose-Capillary 134 (H) 65 - 99 mg/dL  Culture, respiratory (NON-Expectorated)     Status: None (Preliminary result)   Collection Time: 07/24/16 12:25 PM  Result Value Ref Range   Specimen Description TRACHEAL ASPIRATE    Special Requests Normal    Gram Stain      MODERATE WBC PRESENT, PREDOMINANTLY PMN FEW GRAM POSITIVE COCCI IN PAIRS IN CLUSTERS RARE GRAM NEGATIVE COCCOBACILLI RARE GRAM NEGATIVE RODS RARE GRAM POSITIVE RODS    Culture PENDING    Report Status PENDING   CBG monitoring, ED     Status: Abnormal   Collection Time: 07/24/16  5:25 PM  Result Value Ref Range   Glucose-Capillary 100 (H) 65 - 99 mg/dL  Strep pneumoniae urinary antigen     Status: Abnormal   Collection Time: 07/24/16  6:13 PM  Result Value Ref Range   Strep Pneumo Urinary Antigen POSITIVE (A) NEGATIVE   *Note: Due to a large number of results and/or encounters for the requested time period, some results have not been displayed. A complete set of results can be found in Results Review.    Ct Head Wo Contrast  Result Date: 07/24/2016 CLINICAL DATA:  Initial evaluation for acute altered mental status. EXAM: CT HEAD WITHOUT CONTRAST TECHNIQUE: Contiguous axial images were obtained from the base of the skull through the vertex without intravenous contrast. COMPARISON:  Prior CT from 07/11/2015. FINDINGS: Brain: Cerebral volume within normal limits. No acute intracranial hemorrhage. No evidence for acute large vessel territory infarct. No mass lesion, midline shift or mass effect. No hydrocephalus. No extra-axial fluid  collection. Vascular: No hyperdense vessel. Skull: Scalp soft tissues demonstrate no acute abnormality. Calvarium intact. Sinuses/Orbits: Globes and orbital soft tissues within normal limits. Mild scattered mucosal thickening within the sphenoid sinuses. Paranasal sinuses are otherwise clear. No mastoid effusion. IMPRESSION: No acute intracranial process identified. Electronically Signed   By: Benjamin  McClintock M.D.   On: 07/24/2016 05:39   Dg Chest Port 1 View  Result Date: 07/24/2016 CLINICAL DATA:  Acute onset of altered mental status. Shortness of breath. Initial encounter. EXAM: PORTABLE CHEST 1 VIEW COMPARISON:  Chest radiograph performed 04/19/2016 FINDINGS: The patient's tracheostomy tube is seen ending 4 cm above the carina. Right basilar airspace opacity raises concern for pneumonia. Small bilateral pleural effusions are suspected. No pneumothorax is seen. The cardiomediastinal silhouette is borderline normal in size. No acute osseous abnormalities are identified. IMPRESSION: Right basilar airspace opacity is concerning for pneumonia. Suspect small bilateral pleural effusions. Electronically Signed   By: Jeffery  Chang M.D.   On: 07/24/2016   02:09    Review of Systems  Unable to perform ROS: Mental acuity   Blood pressure 101/62, pulse 88, temperature 97.4 F (36.3 C), temperature source Axillary, resp. rate 15, height 5' 8" (1.727 m), weight 59 kg (130 lb), SpO2 98 %. Physical Exam  Constitutional:  Ill appearing in ER with trach collar. Mother at bedsdie. Stigmata of paraplegia.   HENT:  Trach collar in place.   Eyes: Pupils are equal, round, and reactive to light.  Cardiovascular:  Regular tachycardia by bedside monitor.   Respiratory: Effort normal.  Not audibly coarse at present.   GI: Soft.  G tube and colostomy in place. Colostomy patent of mostly liquid / soft stool.   Genitourinary:  Genitourinary Comments: Some mild woody skin changes c/w penile ischemia w/o gangrene  or drainage. SPT in place and irrigates quantitively non-foul urine. No leakage per penis.   Musculoskeletal:  LE wasting and some woody ischemic changes. Sacral VAC in place.   Neurological:  AOx0  Skin: Skin is warm.    Assessment/Plan:  Hyper-reflexic Neurogenic Bladder -  Explained to mother / POA that leakage is due to bladder spasms and NOT any sort of catheter problem and should absolutely NOT be upsized as this will only worsen. Suggest either increased baclofen or adding more GU-selective antispasmodic such as oxybutynin which is available in liquid form making G-Tube administration easier.  I will order this for trial while in house 31m BID. Did warn about possible worsening constiaption which mother states is usually not a problem on current bowel regimen.  Call with questions anytime.   Ismael Treptow 07/24/2016, 7:40 PM

## 2016-07-24 NOTE — ED Notes (Signed)
MD at bedside. 

## 2016-07-24 NOTE — ED Notes (Signed)
Respiratory at bedside.

## 2016-07-24 NOTE — ED Notes (Signed)
Wound care nurse at bedside.

## 2016-07-24 NOTE — ED Notes (Signed)
ED Pharmacy tech contacted for assistance getting medication as ordered.

## 2016-07-24 NOTE — Progress Notes (Signed)
Pt is stable at this time no distress or complications noted.

## 2016-07-24 NOTE — ED Notes (Signed)
Pharmacist here to assist with medications.

## 2016-07-24 NOTE — ED Notes (Signed)
MOther at bedside suctioning patient as per her norm at home. . Very thick mucuous. Respiratory contacted

## 2016-07-24 NOTE — ED Notes (Signed)
Leakage noted around suprapubic tibe. Dr. Marily Memos aware.

## 2016-07-24 NOTE — Progress Notes (Signed)
PHARMACY - PHYSICIAN COMMUNICATION CRITICAL VALUE ALERT - BLOOD CULTURE IDENTIFICATION (BCID)  Results for orders placed or performed during the hospital encounter of 07/24/16  Blood Culture ID Panel (Reflexed) (Collected: 07/24/2016  2:00 AM)  Result Value Ref Range   Enterococcus species NOT DETECTED NOT DETECTED   Listeria monocytogenes NOT DETECTED NOT DETECTED   Staphylococcus species NOT DETECTED NOT DETECTED   Staphylococcus aureus NOT DETECTED NOT DETECTED   Streptococcus species DETECTED (A) NOT DETECTED   Streptococcus agalactiae DETECTED (A) NOT DETECTED   Streptococcus pneumoniae NOT DETECTED NOT DETECTED   Streptococcus pyogenes NOT DETECTED NOT DETECTED   Acinetobacter baumannii NOT DETECTED NOT DETECTED   Enterobacteriaceae species NOT DETECTED NOT DETECTED   Enterobacter cloacae complex NOT DETECTED NOT DETECTED   Escherichia coli NOT DETECTED NOT DETECTED   Klebsiella oxytoca NOT DETECTED NOT DETECTED   Klebsiella pneumoniae NOT DETECTED NOT DETECTED   Proteus species NOT DETECTED NOT DETECTED   Serratia marcescens NOT DETECTED NOT DETECTED   Haemophilus influenzae NOT DETECTED NOT DETECTED   Neisseria meningitidis NOT DETECTED NOT DETECTED   Pseudomonas aeruginosa NOT DETECTED NOT DETECTED   Candida albicans NOT DETECTED NOT DETECTED   Candida glabrata NOT DETECTED NOT DETECTED   Candida krusei NOT DETECTED NOT DETECTED   Candida parapsilosis NOT DETECTED NOT DETECTED   Candida tropicalis NOT DETECTED NOT DETECTED    Name of physician (or Provider) Contacted: Dr. Marily Memos, Dr. Tommy Medal  Changes to prescribed antibiotics required:  Discontinue imipenem and vancomycin. Change to ceftriaxone 2gm Iv q24hr (patient tolerated a course of cefepime in 12/2015 given in conjunction with solumedrol)  Plan -Discontinue imipenem and vancomycin -Begin ceftriaxone 2gm IV q24h and give hydrocortisone 20mg  po 1 hour before each dose  Hildred Laser, Pharm D 07/24/2016 5:52  PM

## 2016-07-24 NOTE — ED Notes (Signed)
Trach suctioned of small amount yellow phlegm.

## 2016-07-24 NOTE — ED Provider Notes (Signed)
[facility] DEPT Provider Note   CSN: [id number] Arrival date & time: 07/24/16  0050   By signing my name below, I, Jason [name], attest that this documentation has been prepared under the direction and in the presence of Jason Etienne, DO . Electronically Signed: Neta [name], ED Scribe. 07/24/2016. 1:08 AM.   History   Chief Complaint Chief Complaint  Patient presents with  . Altered Mental Status   LEVEL 5 CAVEAT DUE TO AMS  The history is provided by a parent. No language interpreter was used.   HPI Comments:  Jason [name] is a 30 y.o. male who presents to the Emergency Department via EMS due to altered mental status since yesterday. Pt is quadriplegic. Per EMS, pt was conversational 2 days ago, and then mom tried to wake patient up today at 1600 to talk but was not speaking. When he was able to be responsive today, he was scared and crying when waking up. Pt has had a foley changed every 2 weeks. Pt has PMHx of sepsis and UTIs. EMS reports vitals en route of 97.7 temp, BP of 111/68, sugar of 201, and 76 HR. Per mom, pt's current state is unusual. Mother is unsure of what time exactly pt was last in his normal state. Pt has taken magnesium oxide, Claritin, hydrocortisone. No alleviating factors noted. Pt denies other associated symptoms.   Past Medical History:  Diagnosis Date  . Anemia   . Anxiety   . Arthritis   . Asthma   . 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 Colostomy in place [facility])   . Complication of anesthesia    difficulty waking up, will have sudden drop in BP and O2 sats  . Depression   . Diabetic neuropathy ([place])   . Diabetic retinopathy ([place])   . Dysrhythmia    usually due to medications  . Family history of anesthesia complication    Pt mother can't have epidural procedures  . Fibromyalgia   . Gastroparesis   . GERD (gastroesophageal reflux disease)   . H/O constipation   . H/O respiratory failure     . H/O thrombosis   . Hx MRSA infection    on face  . Multiple allergies 11/02/2015  . Pneumonia   . Recurrent UTI 11/02/2015  . Seizures ([place]) 2017   one time during admission in hosptital  . 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 Syncope 02/16/2015  . Tracheostomy present ([place])   . Type I diabetes mellitus ([place])    sees Dr. Loanne Drilling     Patient Active Problem List   Diagnosis Date Noted  . HCAP (healthcare-associated pneumonia) 07/24/2016  . Chronic osteomyelitis involving pelvic region and thigh ([place]) 06/18/2016  . Infection due to multidrug-resistant Pseudomonas aeruginosa   . Hypotension   . Anemia of chronic disease   . Diabetes mellitus type 1, uncontrolled, with complications ([place])   . Yeast UTI   . Decubitus ulcer   . Gastroparesis   . Ventilator dependence ([place])   . Tracheostomy care ([place])   . Arterial hypotension   . Palliative care encounter   . VAP (ventilator-associated pneumonia) ([place])   . Pneumonia of both lower lobes due to Pseudomonas species ([place])   . Acute on chronic respiratory failure with hypoxia ([place])   . Seizures ([place])   . Anxiety state   . Chronic hypotension   . Acute renal failure ([place])   . Urinary tract  infection, site not specified   . Diabetes mellitus type 1 with complications ([place])   . Fibromyalgia   . PNA (pneumonia)   . Abdominal distension   . Fever   . Healthcare-associated pneumonia   . Pyrexia   . Fistula   . Pneumonia   . Pressure ulcer 12/07/2015  . Sepsis ([place])   . Acute respiratory failure ([place])   . Respiratory failure, chronic ([place]) 12/06/2015  . UTI (lower urinary tract infection)   . Hyponatremia   . Paraplegia ([place])   . Multiple allergies 11/02/2015  . Recurrent UTI 11/02/2015  . Tracheitis   . Tracheostomy status ([place])   . Eosinophilia   . FUO (fever of unknown origin)   . Severe sepsis ([place])   . Severe dehydration 08/25/2015  . Acute kidney injury ([place]) 08/25/2015  . AKI (acute kidney  injury) ([place])   . History of MDR Pseudomonas aeruginosa infection   . MDR Acinetobacter baumannii infection   . Decubitus ulcer of right perineal ischial region, stage 4 ([place]) 07/15/2015  . Decubitus ulcer of left perineal ischial region, stage 4 ([place]) 07/15/2015  . Decubitus ulcer of sacral region, stage 4 ([place]) 07/15/2015  . Hypomagnesemia   . Acute on chronic respiratory failure ([place])   . Tracheostomy dependent ([place])   . Chronic anemia 07/10/2015  . Uncontrolled type 1 diabetes mellitus with hyperglycemia ([place]) 07/10/2015  . Dysphagia, pharyngoesophageal phase 07/04/2015  . Chronic respiratory failure with hypoxia ([place])   . Severe protein-calorie malnutrition ([place])   . DVT (deep venous thrombosis) ([place])   . History of pulmonary embolism   . 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([place])   . 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 ([place])   . Suprapubic catheter ([place])   . Acute encephalopathy 07/18/2014  . SIRS (systemic inflammatory response syndrome) ([place]) 05/28/2014  . Neurogenic bladder 04/19/2014  . Gastroparesis due to DM ([place]) 06/02/2013    Past Surgical History:  Procedure Laterality Date  . APPLICATION OF A-CELL OF BACK N/A 11/12/2014   Procedure: APPLICATION A CELL AND VAC ;  Surgeon: Theodoro Kos, DO;  Location: [facility];  Service: Plastics;  Laterality: N/A;  . APPLICATION OF A-CELL OF BACK N/A 12/08/2014   Procedure: APPLICATION OF A-CELL AND WOUND VAC ;  Surgeon: Theodoro Kos, DO;  Location: [facility];  Service: Plastics;  Laterality: N/A;  . APPLICATION OF A-CELL OF CHEST/ABDOMEN N/A 08/04/2015   Procedure: APPLICATION OF A-CELL ;  Surgeon: Loel Lofty Dillingham, DO;  Location: [facility];  Service: Plastics;  Laterality: N/A;  . COLOSTOMY    . DEBRIDMENT OF DECUBITUS ULCER N/A 10/04/2014   Procedure: DEBRIDMENT OF DECUBITUS ULCER;  Surgeon: Georganna Skeans, MD;  Location: [facility];  Service: General;  Laterality: N/A;  . GASTROSTOMY TUBE PLACEMENT    . HEMATOMA EVACUATION N/A  05/05/2015   Procedure: EVACUATION HEMATOMA bedside procedure;  Surgeon: Theodoro Kos, DO;  Location: [facility];  Service: Plastics;  Laterality: N/A;  . INCISION AND DRAINAGE OF WOUND N/A 11/12/2014   Procedure: IRRIGATION AND DEBRIDEMENT OF WOUNDS WITH BONE BIOPSY AND SURGICAL PREP ;  Surgeon: Theodoro Kos, DO;  Location: [facility];  Service: Plastics;  Laterality: N/A;  . INCISION AND DRAINAGE OF WOUND N/A 11/18/2014   Procedure: IRRIGATION AND DEBRIDEMENT OF SACRAL ULCER ONLY WITH PLACEMENT OF A CELL AND VAC/ DRESSING CHANGE TO UPPER BACK AREA.;  Surgeon: Theodoro Kos, DO;  Location: [facility];  Service: Plastics;  Laterality: N/A;  . INCISION AND DRAINAGE OF  WOUND N/A 11/25/2014   Procedure: IRRIGATION AND DEBRIDEMENT OF SACRAL ULCER AND BACK BURN WITH PLACEMENT OF A-CELL;  Surgeon: Theodoro Kos, DO;  Location: [facility];  Service: Plastics;  Laterality: N/A;  . INCISION AND DRAINAGE OF WOUND Right 12/08/2014   Procedure: IRRIGATION AND DEBRIDEMENT SACRAL WOUND AND RIGHT ISCHIAL WOUND ;  Surgeon: Theodoro Kos, DO;  Location: [facility];  Service: Plastics;  Laterality: Right;  . INCISION AND DRAINAGE OF WOUND Bilateral 05/05/2015   Procedure: IRRIGATION AND DEBRIDEMENT SACRAL ULCER;  Surgeon: Theodoro Kos, DO;  Location: [facility];  Service: Plastics;  Laterality: Bilateral;  . INCISION AND DRAINAGE OF WOUND N/A 08/04/2015   Procedure: IRRIGATION AND DEBRIDEMENT OF SACRAL ULCER AND ;  Surgeon: Loel Lofty Dillingham, DO;  Location: [place];  Service: Plastics;  Laterality: N/A;  . INSERTION OF SUPRAPUBIC CATHETER N/A 10/12/2014   Procedure: INSERTION OF SUPRAPUBIC CATHETER;  Surgeon: Reece Packer, MD;  Location: [facility];  Service: Urology;  Laterality: N/A;  . IR GENERIC HISTORICAL  07/11/2016   IR CM INJ ANY COLONIC TUBE W/FLUORO 07/11/2016 Arne Cleveland, MD [facility]-INTERV RAD  . LAPAROSCOPIC DIVERTED COLOSTOMY N/A 10/12/2014   Procedure: LAPAROSCOPIC DIVERTING COLOSTOMY;  Surgeon: Donnie Mesa, MD;  Location: [facility];  Service:  General;  Laterality: N/A;  . MINOR APPLICATION OF WOUND VAC N/A 11/25/2014   Procedure:  WOUND VAC CHANGE;  Surgeon: Theodoro Kos, DO;  Location: [facility];  Service: Plastics;  Laterality: N/A;  . MULTIPLE EXTRACTIONS WITH ALVEOLOPLASTY N/A 08/03/2014   Procedure: MULTIPLE EXTRACTIONS;  Surgeon: Gae Bon, DDS;  Location: [facility];  Service: Oral Surgery;  Laterality: N/A;  . RADIOLOGY WITH ANESTHESIA N/A 07/05/2016   Procedure: RADIOLOGY WITH ANESTHESIA MRI OF PELVIS WITH AND WITHOUT;  Surgeon: Medication Radiologist, MD;  Location: [facility];  Service: Radiology;  Laterality: N/A;  . TEE WITHOUT CARDIOVERSION N/A 08/17/2014   Procedure: TRANSESOPHAGEAL ECHOCARDIOGRAM (TEE);  Surgeon: Dorothy Spark, MD;  Location: [facility];  Service: Cardiovascular;  Laterality: N/A;  . TEE WITHOUT CARDIOVERSION N/A 05/05/2015   Procedure: TRANSESOPHAGEAL ECHOCARDIOGRAM (TEE);  Surgeon: Lelon Perla, MD;  Location: [facility] ENDOSCOPY;  Service: Cardiovascular;  Laterality: N/A;  . TONSILLECTOMY         Home Medications    Prior to Admission medications   Medication Sig Start Date End Date Taking? Authorizing Provider  acetaminophen (TYLENOL) 325 MG tablet Take 2 tablets (650 mg total) by mouth every 6 (six) hours as needed for mild pain, moderate pain, fever or headache. 01/14/15  Yes Jessica U Vann, DO  albuterol (PROVENTIL) (2.5 MG/3ML) 0.083% nebulizer solution Take 3 mLs (2.5 mg total) by nebulization 4 (four) times daily. Patient taking differently: Take 2.5 mg by nebulization See admin instructions. Two to four times a day 07/03/16  Yes [name], MD  baclofen (LIORESAL) 20 MG tablet Take 1 tablet (20 mg total) by mouth 4 (four) times daily. 06/26/16  Yes [name], MD  BISACODYL 5 MG EC tablet Take ONE (1) tablet by mouth twice daily as needed FOR MODERATE CONSTIPATION 06/19/16  Yes [name], MD  buPROPion (WELLBUTRIN XL) 150 MG 24 hr tablet Take 1 tablet (150 mg total) by mouth daily.  06/15/16  Yes [name], MD  camphor-menthol [facility]) lotion Apply topically as needed for itching. 09/02/15  Yes Shanker Kristeen Mans, MD  dexlansoprazole (DEXILANT) 60 MG capsule Take 1 capsule (60 mg total) by mouth daily. 03/14/16  Yes [name], MD  docusate (COLACE) 50 MG/5ML liquid Take 10 mLs (100 mg total) by mouth 2 (two) times daily. Patient taking differently: Take 100 mg by mouth daily.  11/10/15  Yes [name], MD  doxycycline (VIBRA-TABS) 100 MG tablet Take 1 tablet (100 mg total) by mouth 2 (two) times daily. Patient taking differently: Take 100 mg by mouth every evening.  02/07/16  Yes [name], MD  fentaNYL (DURAGESIC - DOSED MCG/HR) 75 MCG/HR place 1 patch (24mcg total) onto the skin every 3 (three) days 07/11/16  Yes [name], MD  ferrous sulfate 325 (65 FE) MG tablet Take 1 tablet (325 mg total) by mouth daily with breakfast. 09/27/15  Yes [name], MD  glucagon (GLUCAGON EMERGENCY) 1 MG injection INJECT INTO MUSCLE ONCE AS NEEDED 07/05/15  Yes Renato Shin, MD  GLUCERNA (GLUCERNA) LIQD 1,000 mLs by PEG Tube route continuous. 70 ml/hr   Yes Historical Provider, MD  glucose blood (ACCU-CHEK SMARTVIEW) test strip Use to check blood sugar 4 times per day. Dx Code: E11.9. Appointment needed for further refills. 12/13/15  Yes Renato Shin, MD  hydrocortisone (CORTEF) 10 MG tablet Take 1 tablet (10 mg total) by mouth every evening. 02/13/16  Yes [name], MD  hydrocortisone (CORTEF) 20 MG tablet Take 1 tablet (20 mg total) by mouth every morning. 02/13/16  Yes [name], MD  HYDROmorphone (DILAUDID) 8 MG tablet Take ONE (1) tablet (8mg  total) by mouth every 6 hours as needed for severe pain Patient taking differently: Take 8 mg by mouth every 6 (six) hours as needed for severe pain.  07/11/16  Yes [name], MD  insulin aspart (NOVOLOG FLEXPEN) 100 UNIT/ML FlexPen Inject 0-10 Units into the skin 4 (four) times daily as needed for high blood sugar (CBG >200). Per  sliding scale: CBG <200 = 0 units, 201-300 = 4 units, 301-400 = 6 units, >400 = 10 units   Yes Historical Provider, MD  insulin glargine (LANTUS) 100 UNIT/ML injection Inject 0.15 mLs (15 Units total) into the skin at bedtime. 08/16/15  Yes Janece Canterbury, MD  loratadine (CLARITIN) 10 MG tablet Take 1 tablet (10 mg total) by mouth daily. 11/25/15  Yes [name], MD  LORazepam (ATIVAN) 0.5 MG tablet Take 1 tablet (0.5 mg total) by mouth 3 (three) times daily as needed for anxiety. Reported on 11/02/2015 Patient taking differently: Take 0.25-0.5 mg by mouth 3 (three) times daily as needed for anxiety. Reported on 11/02/2015 06/20/16  Yes [name], MD  LYRICA 100 MG capsule Take ONE (1)  capsule by mouth three times daily (morning,afternoon and evening) and 2 capsules at bedtime Patient taking differently: Take 100 mg by mouth three times a day (morning/afternoon/evening) and 200 mg at bedtime 05/25/16  Yes [name], MD  Magnesium Oxide 400 (240 Mg) MG TABS Take 400 mg by mouth 2 (two) times daily. 07/09/16  Yes Historical Provider, MD  midodrine (PROAMATINE) 10 MG tablet Take 2 tablets (20 mg total) by mouth 3 (three) times daily with meals. Patient taking differently: Take 40 mg by mouth daily as needed.  10/28/15  Yes [name], MD  Multiple Vitamin (MULTIVITAMIN WITH MINERALS) TABS tablet Place 1 tablet into feeding tube daily. 06/01/15  Yes Erick Colace, NP  mupirocin ointment (BACTROBAN) 2 % Apply to affected area twice a day as needed 06/19/16  Yes [name], MD  ondansetron (ZOFRAN ODT) 8 MG disintegrating tablet Place 1 tablet (8  mg total) into feeding tube every 8 (eight) hours as needed for nausea or vomiting. 06/01/15  Yes Erick Colace, NP  Oxycodone HCl 20 MG TABS Take 1 tablet (20 mg total) by mouth every 6 (six) hours as needed (pain). 07/11/16  Yes [name], MD  SURE COMFORT INS SYR 1CC/29G 29G X 1/2" 1 ML MISC Use As Directed 01/23/16  Yes [name], MD  SURE  COMFORT PEN NEEDLES 29G X 12.7MM MISC Use As Directed 03/12/16  Yes [name], MD  UNABLE TO FIND AQUACEL: Apply to feet and toes daily   Yes Historical Provider, MD  VOLTAREN 1 % GEL apply 2 grams topically twice a day Patient taking differently: Apply 2 grams topically twice a day as needed for shoulder pain 04/24/16  Yes [name], MD  Water For Irrigation, Sterile (FREE WATER) SOLN Place 200 mLs into feeding tube every 8 (eight) hours. Patient taking differently: Place 300-400 mLs into feeding tube every 8 (eight) hours.  10/11/15  Yes [name], MD  ciprofloxacin (CIPRO) 500 MG tablet Take 1 tablet (500 mg total) by mouth 2 (two) times daily. Patient not taking: Reported on 07/24/2016 02/14/16   [name], MD  fluconazole (DIFLUCAN) 200 MG tablet Take 1 tablet (200 mg total) by mouth daily. Patient not taking: Reported on 07/24/2016 01/26/16   Cherene Altes, MD  Magnesium Oxide 400 MG CAPS Take 1 capsule (400 mg total) by mouth 2 (two) times daily. Patient not taking: Reported on 07/24/2016 09/27/15   [name], MD  nitrofurantoin, macrocrystal-monohydrate, (MACROBID) 100 MG capsule Take 1 capsule (100 mg total) by mouth 2 (two) times daily. Patient not taking: Reported on 07/24/2016 06/20/16   [name], MD  pantoprazole sodium (PROTONIX) 40 mg/20 mL PACK Place 20 mLs (40 mg total) into feeding tube daily. Patient not taking: Reported on 07/24/2016 06/01/15   Erick Colace, NP  saccharomyces boulardii (FLORASTOR) 250 MG capsule Take 1 capsule (250 mg total) by mouth 2 (two) times daily. Patient not taking: Reported on 07/24/2016 02/20/16   [name], MD  SANTYL ointment Apply 1 application topically every Monday, Wednesday, and Friday. Apply to necrotic tissue in the sacral wound only. Patient not taking: Reported on 07/24/2016 07/03/16   [name], MD    Family History Family History  Problem Relation Age of Onset  . Diabetes Father   . Hypertension Father     . Heart disease Mother     congenital - MVP  . Asthma      fhx  . Hypertension      fhx  . Stroke      fhx    Social History Social History  Substance Use Topics  . Smoking status: Former Smoker    Packs/day: 1.00    Years: 12.00    Types: Cigars, Cigarettes    Quit date: 09/07/2014  . Smokeless tobacco: Never Used  . Alcohol use No     Allergies   Amikacin sulfate; Cefuroxime axetil; Ertapenem; Morphine and related; Penicillins; Sulfa antibiotics; Tessalon [benzonatate]; Shellfish allergy; Levaquin [levofloxacin in d5w]; Nsaids; and Miripirium   Review of Systems Review of Systems LEVEL 5 CAVEAT DUE TO AMS  Physical Exam Updated Vital Signs BP 110/71   Pulse 77   Temp 97.3 F (36.3 C) (Oral)   Resp 19   Ht 5\' 8"  (1.727 m)   Wt 130 lb (59 kg)   SpO2 98%  BMI 19.77 kg/m   Physical Exam  Constitutional:  Chronically ill appearing, contractures to BLE. Wound vac in place to sacrum  HENT:  Head: Normocephalic and atraumatic.  Eyes: EOM are normal. Pupils are equal, round, and reactive to light.  Neck: Normal range of motion. Neck supple. No JVD present.  Cardiovascular: Normal rate and regular rhythm.  Exam reveals no gallop and no friction rub.   No murmur heard. Pulmonary/Chest: No respiratory distress. He has no wheezes.  Abdominal: He exhibits no distension. There is no rebound and no guarding.  Musculoskeletal: Normal range of motion.  Neurological:  Initial exam eyes flutter with tap to forehead.  On reassessment opening eyes to voice and will respond   Skin: No rash noted. No pallor.  Psychiatric: He has a normal mood and affect. His behavior is normal.  Nursing note and vitals reviewed.    ED Treatments / Results  DIAGNOSTIC STUDIES:  Oxygen Saturation is 100% on RA, normal by my interpretation.    COORDINATION OF CARE:  1:10 AM Discussed treatment plan with pt's mother at bedside and pt's mother agreed to plan due to pt's altered mental  status.   Labs (all labs ordered are listed, but only abnormal results are displayed) Labs Reviewed  COMPREHENSIVE METABOLIC PANEL - Abnormal; Notable for the following:       Result Value   Sodium 127 (*)    Chloride 100 (*)    CO2 17 (*)    Glucose, Bld 160 (*)    BUN 28 (*)    Calcium 8.4 (*)    Albumin 1.7 (*)    ALT 16 (*)    Alkaline Phosphatase 173 (*)    All other components within normal limits  CBC WITH DIFFERENTIAL/PLATELET - Abnormal; Notable for the following:    WBC 40.0 (*)    RBC 2.65 (*)    Hemoglobin 6.6 (*)    HCT 21.5 (*)    MCH 25.3 (*)    Platelets 887 (*)    Neutro Abs 35.6 (*)    Monocytes Absolute 2.8 (*)    All other components within normal limits  URINALYSIS, ROUTINE W REFLEX MICROSCOPIC (NOT AT [facility]) - Abnormal; Notable for the following:    Color, Urine AMBER (*)    APPearance CLOUDY (*)    Hgb urine dipstick SMALL (*)    Bilirubin Urine SMALL (*)    Ketones, ur 15 (*)    Protein, ur 100 (*)    Leukocytes, UA LARGE (*)    All other components within normal limits  RAPID URINE DRUG SCREEN, HOSP PERFORMED - Abnormal; Notable for the following:    Opiates POSITIVE (*)    All other components within normal limits  URINE MICROSCOPIC-ADD ON - Abnormal; Notable for the following:    Squamous Epithelial / LPF 0-5 (*)    Bacteria, UA MANY (*)    Casts HYALINE CASTS (*)    All other components within normal limits  I-STAT CHEM 8, ED - Abnormal; Notable for the following:    Sodium 129 (*)    Chloride 100 (*)    BUN 30 (*)    Glucose, Bld 157 (*)    Hemoglobin 8.2 (*)    HCT 24.0 (*)    All other components within normal limits  I-STAT VENOUS BLOOD GAS, ED - Abnormal; Notable for the following:    pCO2, Ven 28.5 (*)    Bicarbonate 15.8 (*)    Acid-base deficit 9.0 (*)  All other components within normal limits  URINE CULTURE  CULTURE, BLOOD (ROUTINE X 2)  CULTURE, BLOOD (ROUTINE X 2)  AMMONIA  ETHANOL  BLOOD GAS, VENOUS  I-STAT CG4  LACTIC ACID, ED  CBG MONITORING, ED  POC OCCULT BLOOD, ED  TYPE AND SCREEN  PREPARE RBC (CROSSMATCH)    EKG  EKG Interpretation  Date/Time:  Tuesday July 24 2016 01:05:21 EDT Ventricular Rate:  74 PR Interval:    QRS Duration: 79 QT Interval:  411 QTC Calculation: 456 R Axis:   72 Text Interpretation:  Sinus rhythm No significant change since last tracing Confirmed by Zhana Jeangilles MD, DANIEL [phone]) on 07/24/2016 1:39:24 AM       Radiology Ct Head Wo Contrast  Result Date: 07/24/2016 CLINICAL DATA:  Initial evaluation for acute altered mental status. EXAM: CT HEAD WITHOUT CONTRAST TECHNIQUE: Contiguous axial images were obtained from the base of the skull through the vertex without intravenous contrast. COMPARISON:  Prior CT from 07/11/2015. FINDINGS: Brain: Cerebral volume within normal limits. No acute intracranial hemorrhage. No evidence for acute large vessel territory infarct. No mass lesion, midline shift or mass effect. No hydrocephalus. No extra-axial fluid collection. Vascular: No hyperdense vessel. Skull: Scalp soft tissues demonstrate no acute abnormality. Calvarium intact. Sinuses/Orbits: Globes and orbital soft tissues within normal limits. Mild scattered mucosal thickening within the sphenoid sinuses. Paranasal sinuses are otherwise clear. No mastoid effusion. IMPRESSION: No acute intracranial process identified. Electronically Signed   By: Jeannine Boga M.D.   On: 07/24/2016 05:39   Dg Chest Port 1 View  Result Date: 07/24/2016 CLINICAL DATA:  Acute onset of altered mental status. Shortness of breath. Initial encounter. EXAM: PORTABLE CHEST 1 VIEW COMPARISON:  Chest radiograph performed 04/19/2016 FINDINGS: The patient's tracheostomy tube is seen ending 4 cm above the carina. Right basilar airspace opacity raises concern for pneumonia. Small bilateral pleural effusions are suspected. No pneumothorax is seen. The cardiomediastinal silhouette is borderline normal in  size. No acute osseous abnormalities are identified. IMPRESSION: Right basilar airspace opacity is concerning for pneumonia. Suspect small bilateral pleural effusions. Electronically Signed   By: Garald Balding M.D.   On: 07/24/2016 02:09    Procedures Procedures (including critical care time)  Medications Ordered in ED Medications  vancomycin (VANCOCIN) IVPB 1000 mg/200 mL premix (1,000 mg Intravenous New Bag/Given 07/24/16 0530)  0.9 %  sodium chloride infusion (not administered)  0.9 %  sodium chloride infusion ( Intravenous New Bag/Given 07/24/16 0444)  ceFEPIme (MAXIPIME) 2 g in dextrose 5 % 50 mL IVPB (0 g Intravenous Stopped 07/24/16 0512)  sodium chloride 0.9 % bolus 1,000 mL (0 mLs Intravenous Stopped 07/24/16 [id number])     Initial Impression / Assessment and Plan / ED Course  I have reviewed the triage vital signs and the nursing notes.  Pertinent labs & imaging results that were available during my care of the patient were reviewed by me and considered in my medical decision making (see chart for details).  Clinical Course    29 yo M with a cc of AMS.  Found to have Possible pneumonia as well as anemia. Ordered to transfuse. Admit.  CRITICAL CARE Performed by: Cecilio Asper   Total critical care time: 35 minutes  Critical care time was exclusive of separately billable procedures and treating other patients.  Critical care was necessary to treat or prevent imminent or life-threatening deterioration.  Critical care was time spent personally by me on the following activities: development of treatment plan with patient and/or  surrogate as well as nursing, discussions with consultants, evaluation of patient's response to treatment, examination of patient, obtaining history from patient or surrogate, ordering and performing treatments and interventions, ordering and review of laboratory studies, ordering and review of radiographic studies, pulse oximetry and re-evaluation  of patient's condition.  The patients results and plan were reviewed and discussed.   Any x-rays performed were independently reviewed by myself.   Differential diagnosis were considered with the presenting HPI.  Medications  vancomycin (VANCOCIN) IVPB 1000 mg/200 mL premix (1,000 mg Intravenous New Bag/Given 07/24/16 0530)  0.9 %  sodium chloride infusion (not administered)  0.9 %  sodium chloride infusion ( Intravenous New Bag/Given 07/24/16 0444)  ceFEPIme (MAXIPIME) 2 g in dextrose 5 % 50 mL IVPB (0 g Intravenous Stopped 07/24/16 0512)  sodium chloride 0.9 % bolus 1,000 mL (0 mLs Intravenous Stopped 07/24/16 [id number])    Vitals:   07/24/16 0534 07/24/16 0545 07/24/16 0554 07/24/16 0600  BP:  102/91 102/91 110/71  Pulse:  78 76 77  Resp:  15  19  Temp: 97.3 F (36.3 C)     TempSrc: Oral     SpO2:  99% 99% 98%  Weight:      Height:        Final diagnoses:  Transient alteration of awareness  Anemia, unspecified type  HCAP (healthcare-associated pneumonia)    Admission/ observation were discussed with the admitting physician, patient and/or family and they are comfortable with the plan.     Final Clinical Impressions(s) / ED Diagnoses   Final diagnoses:  Transient alteration of awareness  Anemia, unspecified type  HCAP (healthcare-associated pneumonia)    New Prescriptions New Prescriptions   No medications on file  I personally performed the services described in this documentation, which was scribed in my presence. The recorded information has been reviewed and is accurate.      Jason Etienne, DO 07/24/16 [phone]

## 2016-07-25 DIAGNOSIS — K219 Gastro-esophageal reflux disease without esophagitis: Secondary | ICD-10-CM

## 2016-07-25 DIAGNOSIS — M869 Osteomyelitis, unspecified: Secondary | ICD-10-CM

## 2016-07-25 LAB — BASIC METABOLIC PANEL
Anion gap: 6 (ref 5–15)
BUN: 22 mg/dL — AB (ref 6–20)
CO2: 19 mmol/L — ABNORMAL LOW (ref 22–32)
Calcium: 8.4 mg/dL — ABNORMAL LOW (ref 8.9–10.3)
Chloride: 108 mmol/L (ref 101–111)
Creatinine, Ser: 0.64 mg/dL (ref 0.61–1.24)
GFR calc Af Amer: >60 mL/min (ref >60)
GLUCOSE: 116 mg/dL — AB (ref 65–99)
POTASSIUM: 4.2 mmol/L (ref 3.5–5.1)
Sodium: 133 mmol/L — ABNORMAL LOW (ref 135–145)

## 2016-07-25 LAB — INFLUENZA PANEL BY PCR (TYPE A & B)
H1N1 flu by pcr: NOT DETECTED
INFLAPCR: NEGATIVE
INFLBPCR: NEGATIVE

## 2016-07-25 LAB — CBC WITH DIFFERENTIAL/PLATELET
BASOS PCT: 0 %
Basophils Absolute: 0 10*3/uL (ref 0.0–0.1)
EOS PCT: 0 %
Eosinophils Absolute: 0 10*3/uL (ref 0.0–0.7)
HCT: 24.7 % — ABNORMAL LOW (ref 39.0–52.0)
Hemoglobin: 7.9 g/dL — ABNORMAL LOW (ref 13.0–17.0)
LYMPHS ABS: 1.7 10*3/uL (ref 0.7–4.0)
Lymphocytes Relative: 5 %
MCH: 25.8 pg — AB (ref 26.0–34.0)
MCHC: 32 g/dL (ref 30.0–36.0)
MCV: 80.7 fL (ref 78.0–100.0)
MONO ABS: 2.6 10*3/uL — AB (ref 0.1–1.0)
Monocytes Relative: 8 %
Neutro Abs: 28.7 10*3/uL — ABNORMAL HIGH (ref 1.7–7.7)
Neutrophils Relative %: 87 %
PLATELETS: 847 10*3/uL — AB (ref 150–400)
RBC: 3.06 MIL/uL — ABNORMAL LOW (ref 4.22–5.81)
RDW: 15.2 % (ref 11.5–15.5)
WBC: 33 10*3/uL — ABNORMAL HIGH (ref 4.0–10.5)

## 2016-07-25 LAB — TYPE AND SCREEN
ABO/RH(D): O POS
ANTIBODY SCREEN: NEGATIVE
Unit division: 0

## 2016-07-25 LAB — MRSA PCR SCREENING: MRSA BY PCR: POSITIVE — AB

## 2016-07-25 LAB — GLUCOSE, CAPILLARY
GLUCOSE-CAPILLARY: 107 mg/dL — AB (ref 65–99)
GLUCOSE-CAPILLARY: 112 mg/dL — AB (ref 65–99)
GLUCOSE-CAPILLARY: 179 mg/dL — AB (ref 65–99)
GLUCOSE-CAPILLARY: 279 mg/dL — AB (ref 65–99)
Glucose-Capillary: 114 mg/dL — ABNORMAL HIGH (ref 65–99)
Glucose-Capillary: 261 mg/dL — ABNORMAL HIGH (ref 65–99)

## 2016-07-25 LAB — HIV ANTIBODY (ROUTINE TESTING W REFLEX): HIV SCREEN 4TH GENERATION: NONREACTIVE

## 2016-07-25 LAB — OCCULT BLOOD X 1 CARD TO LAB, STOOL: Fecal Occult Bld: NEGATIVE

## 2016-07-25 MED ORDER — METHYLPREDNISOLONE SODIUM SUCC 40 MG IJ SOLR: 20.0000 mg | Freq: Three times a day (TID) | INTRAMUSCULAR | Status: DC

## 2016-07-25 MED ORDER — IMIPENEM-CILASTATIN 500 MG IV SOLR
500.0000 mg | Freq: Three times a day (TID) | INTRAVENOUS | Status: DC
  Filled 2016-07-25 (×3): qty 500

## 2016-07-25 MED ORDER — FAMOTIDINE IN NACL 20-0.9 MG/50ML-% IV SOLN: 20.0000 mg | Freq: Three times a day (TID) | INTRAVENOUS | Status: DC

## 2016-07-25 MED ORDER — FAMOTIDINE IN NACL 20-0.9 MG/50ML-% IV SOLN
20.0000 mg | Freq: Three times a day (TID) | INTRAVENOUS | Status: DC
  Administered 2016-07-25 – 2016-07-26 (×3): 20 mg via INTRAVENOUS
  Filled 2016-07-25 (×3): qty 50

## 2016-07-25 MED ORDER — IMIPENEM-CILASTATIN 500 MG IV SOLR
1000.0000 mg | Freq: Three times a day (TID) | INTRAVENOUS | Status: DC
  Administered 2016-07-25 – 2016-07-26 (×2): 1000 mg via INTRAVENOUS
  Filled 2016-07-25 (×4): qty 1000

## 2016-07-25 MED ORDER — METHYLPREDNISOLONE SODIUM SUCC 40 MG IJ SOLR
20.0000 mg | Freq: Three times a day (TID) | INTRAMUSCULAR | Status: DC
  Administered 2016-07-25 – 2016-07-26 (×2): 20 mg via INTRAVENOUS
  Filled 2016-07-25 (×2): qty 1

## 2016-07-25 MED ORDER — GLUCERNA 1.2 CAL PO LIQD
1000.0000 mL | Freq: Every day | ORAL | Status: DC
  Administered 2016-07-26 – 2016-08-01 (×6): 1000 mL
  Filled 2016-07-25 (×9): qty 1000

## 2016-07-25 MED ORDER — SODIUM CHLORIDE 0.9 % IV SOLN
500.0000 mg | Freq: Three times a day (TID) | INTRAVENOUS | Status: DC
  Filled 2016-07-25 (×3): qty 500

## 2016-07-25 MED ORDER — HYDROCORTISONE 10 MG PO TABS
10.0000 mg | ORAL_TABLET | ORAL | Status: DC
  Administered 2016-07-25 – 2016-07-29 (×5): 10 mg via ORAL
  Filled 2016-07-25 (×5): qty 1

## 2016-07-25 NOTE — Consult Note (Signed)
[facility] Nurse wound follow up Wound type:Healing full thickness/ Lt lateral 5th toe Measurement:0.5x0.5x0.1cm Wound [id number] Drainage (amount, consistency, odor) scant Periwound:Intact, Dry Dressing procedure/placement/frequency:Applied foam border to wound. Lenard Simmer WOC student

## 2016-07-25 NOTE — Consult Note (Addendum)
[facility] Nurse wound follow up Wound type:Stage 4 Pressure Injury/ Sacrum Measurement:6x9x1xcm Wound bed:Red Granulation tissue 90%, Black necrotic tissue noted to center of wound bed 10% Drainage (amount, consistency, odor) Dark bloody drainge noted in vac canister Periwound:Intact Dressing procedure/placement/frequency:Vac dressing changed, applied black foam to wound bed, covered with drape, bridged to other wound. Lenard Simmer WOC student

## 2016-07-25 NOTE — Consult Note (Addendum)
[facility] Nurse wound follow up Wound type:Stage 4 Pressure Injury/Lt Ischium Measurement:4.5x2.5x1.5cm Wound bed:Red Granulation tissue Drainage (amount, consistency, odor) Dark bloody drainage noted to vac canister Periwound:Intact Dressing procedure/placement/frequency:CHanged vac dressing, applied black foam to wound bed, covered with drape, bridged to other wound. Lenard Simmer WOC student

## 2016-07-25 NOTE — Progress Notes (Signed)
[facility] PROGRESS NOTE  Jason [name] [id number] DOB: 1986/01/29 DOA: 07/24/2016 PCP: [name], MD  Interim summary and HPI 30 y.o. 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 Level V caveat applies as patient is unable to provide reliable history at this time. History provided by patient's mother who is his primary caretaker. Symptoms started around 1600 on 07/23/2016. Patient was essentially unresponsive for family when they tried to wake him up. When patient would awake he endorsed being scared and would cry and acted confused with regards to his surroundings. Mother states that this happens to him from time to time typically when there is an underlying infectious process ongoing. Last Foley change 2 weeks ago. Per family there've been no recent focal complaints.   Assessment/Plan: Acute encephalopathy: likely from infectious etiology -Dx including HCAP, chronic sacral decubitus ulcers with osteomyelitis, UTI and bacteremia. No recent medication changes, no significant metabolic abnormalities or recent seizures. Chest x-ray concerning for right lower lobe pneumonia, UA grossly abnormal the patient is chronically cath, WBC 40 with left shift, afebrile currently. - Vancomycin and imipenem (given on admission -blood cx's with positive proteus and strep species -following Id rec's will use imipenem -follow final data on Blood culture, urine culture  SIRS/HCAP: Bacterial versus viral etiology. Trach dependent. Of note patient was supposed to be currently on doxycycline for tracheitis - but per mother report, medication  inconsistently given as [place] services stop treatment after 7 days; no communication with physician obtained prior to medication discontinuation -D/C Doxy on admission - Pneumonia order set used - imipenem and vanc given on admission; now following preliminary blood cx's and Id rec's will continue just imipenem   UTI: Suprapubic cath in place in order to keep chronic sacral decubitus ulcers clean and dry. Urinalysis chronically abnormal. Per report by family catheter is due for change every 3 weeks (due now). Bladder scan showed bladder with mild leakage of urine around the urostomy site - Antibiotics as above (imipenem) - UCX pending  - Urostomy catheter exchange ordered on admission  - Urology consulted and Appreciate their input; plan is to try oxybutinin   Sacral decubitus ulcers/osteomyelitis: Chronic. Wound VAC in place for several weeks. Family endorses seeing Dr. Marla Roe. I have called her office and spoken to support staff directly about patient he stated they have not seen this patient in the clinic in years. They have seen the patient in the hospital from time to time. It appears that patient was scheduled to start with a new wound care team at [facility], Dr. Norberto Sorenson, [place] [phone]). MRI under anesthesia performed on 07/05/2016 showing "Mild marrow edema in the right posterior acetabulum with enhancement which may be reactive versus secondary to mild osteomyelitis." - Dr Marla Roe to see pt in consultation later today 07-25-16 - Continue wound VAC - will continue imipenem  - Consider repeat MRI, will defer to ID/plastic surgery if needed.  -Air overlay mattress ordered  - Continue wound care, to include collagenase ointment Monday Wednesday Friday   Anemia: Initial CBC showing hemoglobin of 6.6. Repeat labs showing 8.2. Patient's baseline around 9. No reported GI loss. Suspect combination of lab error and ongoing chronic disease and chronic oozing from sacral/decubitus  ulcers. 1 unit PRBC ordered  by EDP - Follow-up CBC to assess Hgb trend   Functional quadriplegia: Secondary to C6 ischemic injury several years ago. -continue supportive care -PT/OT for mobility support while inpatient; basically bed bound   Hyponatremia: 129. Chronic. - Normal saline IVF -will follow electrolytes trend   Adrenal insufficiency: No evidence of being in crisis. -Continue hydrocortisone and midodrine at current dose  Hypotension: Likely secondary to chronic adrenal insufficiency - Continue Midodrine and hydrocortisone  -cortisol level 19 - IVF support as needed  Diabetes: Type 1. - Continue Lantus and SSI  Nutrition: Per report by mother/caregiver patient recently began eating full meals, requiring less PEG tube utilization.  -Nutrition consult for PEG tube nutrition appreciated -will due twelve hours a day of peg tube  - Allow oral intake as tolerated with close supervision to prevent aspiration   Chronic pain: Secondary to chronic wounds and muscular skeletal pain from contractures secondary to paralysis. - Continue fentanyl patch, oxycodone, Baclofen, Lyrica, Voltaren gel -will continue home medication regimen and if possible avoid extra doses of opiates  GERD: - Continue the PPI  Depression/Anxiety: - Continue Ativan, Wellbutrin -no SI or hallucinations appreciated on today's exam  Chronic constipation: - Continue bisacodyl  Code Status: Full Family Communication: mother at bedside  Disposition Plan: remains in stepdown overnight; continue current IV antibiotics, follow culture data   Consultants:  ID  Procedures:  See below for x-ray reports   Antibiotics:  Vancomycin 10/17>>>10/17  Rocephin 10/17>> one dose given  Imipenem 10/17 and resumed again on 10/18  HPI/Subjective: Afebrile, doing better overall today. Oriented X2. Reports pain all over. WBC's trending down.   Objective: Vitals:   07/25/16 0700 07/25/16 0938  BP:     Pulse:  90  Resp:  (!) 23  Temp: 98.2 F (36.8 C)     Intake/Output Summary (Last 24 hours) at 07/25/16 1107 Last data filed at 07/25/16 0530  Gross per 24 hour  Intake                0 ml  Output              975 ml  Net             -975 ml   Filed Weights   07/24/16 0109 07/24/16 2100  Weight: 59 kg (130 lb) 54.9 kg (121 lb 0.5 oz)    Exam:   General:  Afebrile, able to follow simple commands and oriented X2; complaining of pain everywhere   Cardiovascular: no rubs, no gallops, S1 and S2  Respiratory: scattered rhonchi, no crackles   Abdomen: soft, NT, ND, positive BS  Musculoskeletal: no edema or cyanosis   Skin: multiple wounds (affecting thigh left, pressure ulcer stage 4 sacral area and ischium, bilateral toes, calcaneous)  Data Reviewed: Basic Metabolic Panel:  Recent Labs Lab 07/24/16 0116 07/24/16 0209  NA 127* 129*  K 4.4 4.5  CL 100* 100*  CO2 17*  --   GLUCOSE 160* 157*  BUN 28* 30*  CREATININE 1.07 1.00  CALCIUM 8.4*  --    Liver Function Tests:  Recent Labs Lab 07/24/16 0116  AST 17  ALT 16*  ALKPHOS 173*  BILITOT 0.7  PROT 6.7  ALBUMIN 1.7*    Recent Labs Lab 07/24/16 0116  AMMONIA 25   CBC:  Recent Labs Lab 07/24/16 0116 07/24/16 0209  WBC 40.0*  --   NEUTROABS 35.6*  --   HGB 6.6* 8.2*  HCT 21.5* 24.0*  MCV 81.1  --  PLT 887*  --    CBG:  Recent Labs Lab 07/24/16 1725 07/24/16 2104 07/24/16 2315 07/25/16 0304 07/25/16 0824  GLUCAP 100* 127* 93 107* 112*    Recent Results (from the past 240 hour(s))  Urine culture     Status: None (Preliminary result)   Collection Time: 07/24/16  1:35 AM  Result Value Ref Range Status   Specimen Description URINE, CLEAN CATCH  Final   Special Requests NONE  Final   Culture CULTURE REINCUBATED FOR BETTER GROWTH  Final   Report Status PENDING  Incomplete  Blood culture (routine x 2)     Status: None (Preliminary result)   Collection Time: 07/24/16  2:00 AM  Result  Value Ref Range Status   Specimen Description BLOOD RIGHT HAND  Final   Special Requests IN PEDIATRIC BOTTLE 4ML  Final   Culture  Setup Time   Final    AEROBIC BOTTLE ONLY GRAM POSITIVE COCCI IN PAIRS CRITICAL RESULT CALLED TO, READ BACK BY AND VERIFIED WITH: A. MAYER, [place] ON 07/24/16 BY C. JESSUP, MLT.    Culture GRAM POSITIVE COCCI  Final   Report Status PENDING  Incomplete  Blood Culture ID Panel (Reflexed)     Status: Abnormal   Collection Time: 07/24/16  2:00 AM  Result Value Ref Range Status   Enterococcus species NOT DETECTED NOT DETECTED Final   Listeria monocytogenes NOT DETECTED NOT DETECTED Final   Staphylococcus species NOT DETECTED NOT DETECTED Final   Staphylococcus aureus NOT DETECTED NOT DETECTED Final   Streptococcus species DETECTED (A) NOT DETECTED Final    Comment: CRITICAL RESULT CALLED TO, READ BACK BY AND VERIFIED WITH: A. MAYER, [place] ON 07/24/16 BY C. JESSUP, MLT.    Streptococcus agalactiae DETECTED (A) NOT DETECTED Final    Comment: CRITICAL RESULT CALLED TO, READ BACK BY AND VERIFIED WITH: A. MAYER, [place] ON 07/24/16 BY C. JESSUP, MLT.    Streptococcus pneumoniae NOT DETECTED NOT DETECTED Final   Streptococcus pyogenes NOT DETECTED NOT DETECTED Final   Acinetobacter baumannii NOT DETECTED NOT DETECTED Final   Enterobacteriaceae species NOT DETECTED NOT DETECTED Final   Enterobacter cloacae complex NOT DETECTED NOT DETECTED Final   Escherichia coli NOT DETECTED NOT DETECTED Final   Klebsiella oxytoca NOT DETECTED NOT DETECTED Final   Klebsiella pneumoniae NOT DETECTED NOT DETECTED Final   Proteus species NOT DETECTED NOT DETECTED Final   Serratia marcescens NOT DETECTED NOT DETECTED Final   Haemophilus influenzae NOT DETECTED NOT DETECTED Final   Neisseria meningitidis NOT DETECTED NOT DETECTED Final   Pseudomonas aeruginosa NOT DETECTED NOT DETECTED Final   Candida albicans NOT DETECTED NOT DETECTED Final   Candida  glabrata NOT DETECTED NOT DETECTED Final   Candida krusei NOT DETECTED NOT DETECTED Final   Candida parapsilosis NOT DETECTED NOT DETECTED Final   Candida tropicalis NOT DETECTED NOT DETECTED Final  Blood culture (routine x 2)     Status: None (Preliminary result)   Collection Time: 07/24/16  4:05 AM  Result Value Ref Range Status   Specimen Description BLOOD RIGHT FOREARM  Final   Special Requests AEROBIC BOTTLE ONLY 5ML  Final   Culture  Setup Time   Final    AEROBIC BOTTLE ONLY GRAM POSITIVE COCCI IN PAIRS CRITICAL VALUE NOTED.  VALUE IS CONSISTENT WITH PREVIOUSLY REPORTED AND CALLED VALUE.    Culture GRAM POSITIVE COCCI  Final   Report Status PENDING  Incomplete  Culture, respiratory (NON-Expectorated)  Status: None (Preliminary result)   Collection Time: 07/24/16 12:25 PM  Result Value Ref Range Status   Specimen Description TRACHEAL ASPIRATE  Final   Special Requests Normal  Final   Gram Stain   Final    MODERATE WBC PRESENT, PREDOMINANTLY PMN FEW GRAM POSITIVE COCCI IN PAIRS IN CLUSTERS RARE GRAM NEGATIVE COCCOBACILLI RARE GRAM NEGATIVE RODS RARE GRAM POSITIVE RODS    Culture PENDING  Incomplete   Report Status PENDING  Incomplete     Studies: Ct Head Wo Contrast  Result Date: 07/24/2016 CLINICAL DATA:  Initial evaluation for acute altered mental status. EXAM: CT HEAD WITHOUT CONTRAST TECHNIQUE: Contiguous axial images were obtained from the base of the skull through the vertex without intravenous contrast. COMPARISON:  Prior CT from 07/11/2015. FINDINGS: Brain: Cerebral volume within normal limits. No acute intracranial hemorrhage. No evidence for acute large vessel territory infarct. No mass lesion, midline shift or mass effect. No hydrocephalus. No extra-axial fluid collection. Vascular: No hyperdense vessel. Skull: Scalp soft tissues demonstrate no acute abnormality. Calvarium intact. Sinuses/Orbits: Globes and orbital soft tissues within normal limits. Mild  scattered mucosal thickening within the sphenoid sinuses. Paranasal sinuses are otherwise clear. No mastoid effusion. IMPRESSION: No acute intracranial process identified. Electronically Signed   By: Jeannine Boga M.D.   On: 07/24/2016 05:39   Dg Chest Port 1 View  Result Date: 07/24/2016 CLINICAL DATA:  Acute onset of altered mental status. Shortness of breath. Initial encounter. EXAM: PORTABLE CHEST 1 VIEW COMPARISON:  Chest radiograph performed 04/19/2016 FINDINGS: The patient's tracheostomy tube is seen ending 4 cm above the carina. Right basilar airspace opacity raises concern for pneumonia. Small bilateral pleural effusions are suspected. No pneumothorax is seen. The cardiomediastinal silhouette is borderline normal in size. No acute osseous abnormalities are identified. IMPRESSION: Right basilar airspace opacity is concerning for pneumonia. Suspect small bilateral pleural effusions. Electronically Signed   By: Garald Balding M.D.   On: 07/24/2016 02:09    Scheduled Meds: . albuterol  2.5 mg Nebulization BID  . baclofen  20 mg Oral QID  . buPROPion  150 mg Oral Daily  . docusate  100 mg Oral Daily  . feeding supplement (GLUCERNA 1.2 CAL)  1,000 mL Per Tube Daily  . fentaNYL  75 mcg Transdermal Q72H  . free water  300-400 mL Per Tube Q8H  . heparin  5,000 Units Subcutaneous Q8H  . hydrocortisone  20 mg Oral q morning - 10a  . hydrocortisone  20 mg Oral Q24H  . imipenem-cilastatin  500 mg Intravenous Q8H  . insulin aspart  0-5 Units Subcutaneous QHS  . insulin aspart  0-9 Units Subcutaneous TID WC  . insulin glargine  15 Units Subcutaneous QHS  . magnesium oxide  400 mg Oral BID  . oxybutynin  5 mg Per Tube BID  . pantoprazole sodium  40 mg Per Tube Daily  . pregabalin  100 mg Oral TID  . sodium chloride flush  3 mL Intravenous Q12H   Continuous Infusions: . sodium chloride Stopped (07/24/16 1242)  . sodium chloride 50 mL/hr (07/24/16 1242)    Active Problems:   SIRS  (systemic inflammatory response syndrome) ([name])   Suprapubic catheter ([place])   History of MDR Pseudomonas aeruginosa infection   Lower urinary tract infectious disease   Hyponatremia   Diabetes mellitus type 1 with complications ([place])   Fibromyalgia   Decubitus ulcer   Hypotension   HCAP (healthcare-associated pneumonia)   Encephalopathy acute   Osteomyelitis ([place])  Functional quadriplegia ([name])   Adrenal insufficiency ([name])   GERD (gastroesophageal reflux disease)    Time spent: 30 minutes    Barton Dubois  [facility] Pager [phone]. If 7PM-7AM, please contact night-coverage at [email or web address], password [facility] 07/25/2016, 11:07 AM  LOS: 1 day

## 2016-07-25 NOTE — Progress Notes (Signed)
[name] for Infectious Disease    Date of Admission:  07/24/2016   Total days of antibiotics 2      ID: Jason [name] is a 30 y.o. 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 He is increasingly more responsive than yesterday afternoon.   Medications:  . albuterol  2.5 mg Nebulization BID  . baclofen  20 mg Oral QID  . buPROPion  150 mg Oral Daily  . docusate  100 mg Oral Daily  . feeding supplement (GLUCERNA 1.2 CAL)  1,000 mL Per Tube Daily  . fentaNYL  75 mcg Transdermal Q72H  . free water  300-400 mL Per Tube Q8H  . heparin  5,000 Units Subcutaneous Q8H  . hydrocortisone  20 mg Oral q morning - 10a  . hydrocortisone  20 mg Oral Q24H  . imipenem-cilastatin  500 mg Intravenous Q8H  . insulin aspart  0-5 Units Subcutaneous QHS  . insulin aspart  0-9 Units Subcutaneous TID WC  .  insulin glargine  15 Units Subcutaneous QHS  . magnesium oxide  400 mg Oral BID  . oxybutynin  5 mg Per Tube BID  . pantoprazole sodium  40 mg Per Tube Daily  . pregabalin  100 mg Oral TID  . sodium chloride flush  3 mL Intravenous Q12H    Objective: Vitals:   07/25/16 0414 07/25/16 0700 07/25/16 0938 07/25/16 1219  BP:    91/75  Pulse:   90 92  Resp: 14  (!) 23 16  Temp:  98.2 F (36.8 C)  98.8 F (37.1 C)  TempSrc:  Oral  Oral  SpO2:   98% 98%  Weight:      Height:       General: Vital signs reviewed.  Patient is chronically ill appearing, in no acute distress and cooperative with exam.  Eyes: PERRLA, conjunctivae normal, no scleral icterus.  Neck: Trach in place. Malodorous breath/sputum.  Cardiovascular: RRR Pulmonary/Chest: Audibile coarse and rhonchorus breath sounds throughout. No wheezing or rales.  Abdominal: Soft, non-tender, non-distended, BS +, G-Tube in place, contents leaking around tube. Neurological: Alert, follows commands.   Lab Results  Recent Labs  07/24/16 0116 07/24/16 0209  WBC 40.0*  --   HGB 6.6* 8.2*  HCT 21.5* 24.0*  NA 127* 129*  K 4.4 4.5  CL 100* 100*  CO2 17*  --   BUN 28* 30*  CREATININE 1.07 1.00   Liver Panel  Recent Labs  07/24/16 0116  PROT 6.7  ALBUMIN 1.7*  AST 17  ALT 16*  ALKPHOS 173*  BILITOT 0.7   Microbiology: UCx 10/17: Pending BCx 10/17: Strep Agalactiae  Respiratory Cx 10/16: few gram positive cocci in pairs in clusters  Current antibiotics: Vancomycin 10/17>> 10/17 Imipenem-Cilastatin 10/17 >> Cefempime 10/17 >> 10/17  Studies/Results: Ct Head Wo Contrast  Result Date: 07/24/2016 CLINICAL DATA:  Initial evaluation for acute altered mental status. EXAM: CT HEAD WITHOUT CONTRAST TECHNIQUE: Contiguous axial images were obtained from the base of the skull through the vertex without intravenous contrast. COMPARISON:  Prior CT from 07/11/2015. FINDINGS: Brain: Cerebral volume within normal limits. No  acute intracranial hemorrhage. No evidence for acute large vessel territory infarct. No mass lesion, midline shift or mass effect. No hydrocephalus. No extra-axial fluid collection. Vascular: No hyperdense vessel. Skull: Scalp soft tissues demonstrate no acute abnormality. Calvarium intact. Sinuses/Orbits: Globes and orbital soft tissues within normal limits. Mild scattered mucosal thickening within the sphenoid sinuses. Paranasal sinuses are otherwise clear. No mastoid effusion. IMPRESSION: No acute intracranial process identified. Electronically Signed   By: Jeannine Boga M.D.   On: 07/24/2016 05:39   Dg Chest Port 1 View  Result Date: 07/24/2016 CLINICAL DATA:  Acute onset of altered mental status. Shortness of breath. Initial encounter. EXAM: PORTABLE CHEST 1 VIEW COMPARISON:  Chest radiograph performed 04/19/2016 FINDINGS: The patient's tracheostomy tube is seen ending 4 cm above the carina. Right basilar airspace opacity raises concern for pneumonia. Small bilateral pleural effusions are suspected. No pneumothorax is seen. The cardiomediastinal silhouette is borderline normal in size. No acute osseous abnormalities are identified. IMPRESSION: Right basilar airspace opacity is concerning for pneumonia. Suspect small bilateral pleural effusions. Electronically Signed   By: Garald Balding M.D.   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  Streptococcus Agalactiae Bacteremia: Clinically improving already- more alert, afebrile although fever curve is trending up. WBC pending for this morning. Bacteremia could be secondary to underlying pneumonia, infection of chronic sacral decubitus ulcers/osteomyelitis, or UTI. We will await Urine Culture result and continue Imipenem for now. -Continue Imipenem-Cilastin  -Recommend deep and frequent suctioning for secretions -Consider 2 view CXR when able -Repeat BCx tomorrow -Await UCx growth- may be able to narrow antibiotics if growth is consistent with group B strep -Repeat CBC/BMET tomorrow am -Consider pulmonary consult for trach exchange as patient follows with Dr. Chase Caller and [facility] surgery to evaluate chronic sacral decubitus ulcers -Wound Care Following   Martyn Malay, DO PGY-3 Internal Medicine Resident Pager # [phone] 07/25/2016 12:40 PM

## 2016-07-25 NOTE — Consult Note (Signed)
[facility] Nurse wound follow up Wound type:Healing Full Thickness/ Lt Medial Thigh Measurement:2x2x0.2 Wound [id number] dry slough Drainage (amount, consistency, odor) none Periwound:Intact Dressing procedure/placement/frequency:NS moist gauze to wound bed, covered with dry gauze and secured with Medipore tape.  Conservative sharp wound debridement (CSWD performed at the bedside):Yes Lenard Simmer [place] student

## 2016-07-25 NOTE — Consult Note (Addendum)
[facility] Nurse wound follow up Wound type: Healing Deep Tissue Injury/ Lt anterior foot Measurement:1.3x2x0.1cm  Wound [id number] granulation Drainage (amount, consistency, odor) Scant  Periwound:Intact, dry Dressing procedure/placement/frequency:Applied foam border to wound bed.  Lenard Simmer WOC student

## 2016-07-25 NOTE — Consult Note (Addendum)
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 Lenard Simmer WOC student

## 2016-07-25 NOTE — Progress Notes (Signed)
Initial Nutrition Assessment  DOCUMENTATION CODES:   Not applicable  INTERVENTION:    Continue TF regimen as PTA: Glucerna 1.2 at 70 ml/h from 12 midnight to 12 noon every day. This provides ~50% of estimated needs.  Recommend liberalize diet to regular.  NUTRITION DIAGNOSIS:   Increased nutrient needs related to wound healing as evidenced by estimated needs.  GOAL:   Patient will meet greater than or equal to 90% of their needs  MONITOR:   Labs, Weight trends, TF tolerance, Skin, I & O's  REASON FOR ASSESSMENT:   Consult Enteral/tube feeding initiation and management  ASSESSMENT:   30 y.o. 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   Strep PNA + Patient is currently receiving Glucerna 1.2 via PEG at 70 ml/h x 12 hours per day to provide 1008 kcals, 50 gm protein, 676 ml free water daily.  Free water flushes 300-400 ml every 8 hours Also receiving heart healthy diet.  Labs reviewed: sodium low at 129. CBG: 107-112 Medications reviewed and include Colace, Mag-Ox, Insulin. Unable to complete Nutrition-Focused physical exam at this time, however, doubt it would provide much helpful information as muscle loss is expected with quadriplegia.  Patient is familiar to RD Team from previous admissions.  He usually receives a PO diet. Mom reports that Hikeem is a good eater, but he can't eat a lot because he doesn't have his dentures, so he needs soft food. He follows no dietary restrictions at home. Recommend liberalize diet to regular.  Diet Order:  Diet Heart Room service appropriate? Yes; Fluid consistency: Thin  Skin:   Wound (see comment) (stage IV to sacrum & ischial tuberosity)  Last BM:  unknown  Height:   Ht Readings from Last 1 Encounters:  07/24/16 5\' 8"  (1.727 m)    Weight:   Wt Readings from Last 1 Encounters:  07/24/16 121 lb 0.5 oz (54.9 kg)    Ideal Body Weight:  59.5 kg with quadriplegia  BMI:  Body mass index is 18.4 kg/m.  Estimated Nutritional Needs:   Kcal:  1750-2000  Protein:  90-105 gm  Fluid:  2 L  EDUCATION NEEDS:   No education needs identified at this time  Molli Barrows, [place], [place], [place] Pager [phone] After Hours Pager [phone]

## 2016-07-25 NOTE — Consult Note (Signed)
[place] Nurse wound follow up Wound type:Healing Full Thickness/Lt Great toe Measurement:0.5x1x0.1cm Wound [id number] Drainage (amount, consistency, odor) Scant Periwound:Intact, Dry Dressing procedure/placement/frequency:Apply foam border to wound.  Lenard Simmer WOC student

## 2016-07-25 NOTE — Consult Note (Signed)
[place] Nurse wound follow up Wound type:Healing Full thickness/ Lt Lateral Foot Measurement:1x1x0.1cm Wound [id number]  Drainage (amount, consistency, odor) scant Periwound:Intact, dry Dressing procedure/placement/frequency:Applied foam border to wound. Lenard Simmer WOC student

## 2016-07-25 NOTE — Consult Note (Signed)
[place] Nurse wound follow up Wound type:Healing full thickness/ Lt Calcaneous Measurement:1x1.2x0.1cm Wound [id number] epithelium Drainage (amount, consistency, odor) Scant Periwound:Intact, dry Dressing procedure/placement/frequency:Applied Foam border to wound bed Lenard Simmer WOC student

## 2016-07-25 NOTE — Progress Notes (Signed)
  PHARMACY - PHYSICIAN COMMUNICATION CRITICAL VALUE ALERT - BLOOD CULTURE IDENTIFICATION (BCID)  Results for orders placed or performed during the hospital encounter of 07/24/16  Blood Culture ID Panel (Reflexed) (Collected: 07/24/2016  2:00 AM)  Result Value Ref Range   Enterococcus species NOT DETECTED NOT DETECTED   Listeria monocytogenes NOT DETECTED NOT DETECTED   Staphylococcus species NOT DETECTED NOT DETECTED   Staphylococcus aureus NOT DETECTED NOT DETECTED   Streptococcus species DETECTED (A) NOT DETECTED   Streptococcus agalactiae DETECTED (A) NOT DETECTED   Streptococcus pneumoniae NOT DETECTED NOT DETECTED   Streptococcus pyogenes NOT DETECTED NOT DETECTED   Acinetobacter baumannii NOT DETECTED NOT DETECTED   Enterobacteriaceae species NOT DETECTED NOT DETECTED   Enterobacter cloacae complex NOT DETECTED NOT DETECTED   Escherichia coli NOT DETECTED NOT DETECTED   Klebsiella oxytoca NOT DETECTED NOT DETECTED   Klebsiella pneumoniae NOT DETECTED NOT DETECTED   Proteus species NOT DETECTED NOT DETECTED   Serratia marcescens NOT DETECTED NOT DETECTED   Haemophilus influenzae NOT DETECTED NOT DETECTED   Neisseria meningitidis NOT DETECTED NOT DETECTED   Pseudomonas aeruginosa NOT DETECTED NOT DETECTED   Candida albicans NOT DETECTED NOT DETECTED   Candida glabrata NOT DETECTED NOT DETECTED   Candida krusei NOT DETECTED NOT DETECTED   Candida parapsilosis NOT DETECTED NOT DETECTED   Candida tropicalis NOT DETECTED NOT DETECTED   Name of physician (or Provider) ContactedReino Bellis  Changes to prescribed antibiotics required: Blood cx's have no reported as having Group B strep and Proteus. Will need to await susceptibilities before de-escalating but with patient history would expect proteus to be MDR. [name] under the impression that we pre-medicated with imipenem to make sure patient does not have reaction. I see that we have given Solu-Medrol and Pepcid before  giving cefepime, but do not see pre-medications that were given with imipenem in the past. ID is aware and will follow up.  Elenor Quinones, PharmD, BCPS Clinical Pharmacist Pager [phone] 07/25/2016 12:54 PM

## 2016-07-26 DIAGNOSIS — L89009 Pressure ulcer of unspecified elbow, unspecified stage: Secondary | ICD-10-CM

## 2016-07-26 DIAGNOSIS — Z9359 Other cystostomy status: Secondary | ICD-10-CM

## 2016-07-26 LAB — BASIC METABOLIC PANEL
ANION GAP: 5 (ref 5–15)
BUN: 24 mg/dL — ABNORMAL HIGH (ref 6–20)
CO2: 20 mmol/L — ABNORMAL LOW (ref 22–32)
Calcium: 7.8 mg/dL — ABNORMAL LOW (ref 8.9–10.3)
Chloride: 108 mmol/L (ref 101–111)
Creatinine, Ser: 0.61 mg/dL (ref 0.61–1.24)
Glucose, Bld: 221 mg/dL — ABNORMAL HIGH (ref 65–99)
POTASSIUM: 5.1 mmol/L (ref 3.5–5.1)
SODIUM: 133 mmol/L — AB (ref 135–145)

## 2016-07-26 LAB — CULTURE, BLOOD (ROUTINE X 2)

## 2016-07-26 LAB — CULTURE, RESPIRATORY W GRAM STAIN

## 2016-07-26 LAB — CBC
HCT: 25.1 % — ABNORMAL LOW (ref 39.0–52.0)
HEMOGLOBIN: 7.7 g/dL — AB (ref 13.0–17.0)
MCH: 25.1 pg — ABNORMAL LOW (ref 26.0–34.0)
MCHC: 30.7 g/dL (ref 30.0–36.0)
MCV: 81.8 fL (ref 78.0–100.0)
PLATELETS: 595 10*3/uL — AB (ref 150–400)
RBC: 3.07 MIL/uL — AB (ref 4.22–5.81)
RDW: 15.8 % — ABNORMAL HIGH (ref 11.5–15.5)
WBC: 27.9 10*3/uL — AB (ref 4.0–10.5)

## 2016-07-26 LAB — LEGIONELLA PNEUMOPHILA SEROGP 1 UR AG: L. pneumophila Serogp 1 Ur Ag: NEGATIVE

## 2016-07-26 LAB — GLUCOSE, CAPILLARY
GLUCOSE-CAPILLARY: 210 mg/dL — AB (ref 65–99)
GLUCOSE-CAPILLARY: 313 mg/dL — AB (ref 65–99)
GLUCOSE-CAPILLARY: 310 mg/dL — AB (ref 65–99)
Glucose-Capillary: 352 mg/dL — ABNORMAL HIGH (ref 65–99)
Glucose-Capillary: 398 mg/dL — ABNORMAL HIGH (ref 65–99)

## 2016-07-26 LAB — URINE CULTURE

## 2016-07-26 LAB — CULTURE, RESPIRATORY: SPECIAL REQUESTS: NORMAL

## 2016-07-26 MED ORDER — FAMOTIDINE IN NACL 20-0.9 MG/50ML-% IV SOLN
20.0000 mg | Freq: Two times a day (BID) | INTRAVENOUS | Status: DC
  Administered 2016-07-26 – 2016-07-30 (×8): 20 mg via INTRAVENOUS
  Filled 2016-07-26 (×9): qty 50

## 2016-07-26 MED ORDER — METHYLPREDNISOLONE SODIUM SUCC 40 MG IJ SOLR
20.0000 mg | Freq: Three times a day (TID) | INTRAMUSCULAR | Status: DC
  Administered 2016-07-26: 20 mg via INTRAVENOUS
  Filled 2016-07-26: qty 1

## 2016-07-26 MED ORDER — FAMOTIDINE IN NACL 20-0.9 MG/50ML-% IV SOLN
20.0000 mg | Freq: Once | INTRAVENOUS | Status: AC
  Administered 2016-07-27: 20 mg via INTRAVENOUS
  Filled 2016-07-26: qty 50

## 2016-07-26 MED ORDER — SODIUM CHLORIDE 0.9 % IV SOLN
500.0000 mg | Freq: Three times a day (TID) | INTRAVENOUS | Status: DC
  Administered 2016-07-26: 500 mg via INTRAVENOUS
  Filled 2016-07-26 (×3): qty 500

## 2016-07-26 MED ORDER — DEXTROSE 5 % IV SOLN
2.0000 g | Freq: Two times a day (BID) | INTRAVENOUS | Status: DC
  Administered 2016-07-27 – 2016-08-01 (×12): 2 g via INTRAVENOUS
  Filled 2016-07-26 (×16): qty 2

## 2016-07-26 MED ORDER — MUPIROCIN 2 % EX OINT
1.0000 "application " | TOPICAL_OINTMENT | Freq: Two times a day (BID) | CUTANEOUS | Status: AC
  Administered 2016-07-26 – 2016-07-30 (×9): 1 via NASAL
  Filled 2016-07-26 (×2): qty 22

## 2016-07-26 MED ORDER — METHYLPREDNISOLONE SODIUM SUCC 40 MG IJ SOLR
20.0000 mg | Freq: Two times a day (BID) | INTRAMUSCULAR | Status: DC
  Administered 2016-07-26 – 2016-07-30 (×8): 20 mg via INTRAVENOUS
  Filled 2016-07-26 (×8): qty 1

## 2016-07-26 MED ORDER — CHLORHEXIDINE GLUCONATE CLOTH 2 % EX PADS
6.0000 | MEDICATED_PAD | Freq: Every day | CUTANEOUS | Status: AC
  Administered 2016-07-26 – 2016-07-30 (×5): 6 via TOPICAL

## 2016-07-26 MED ORDER — METHYLPREDNISOLONE SODIUM SUCC 40 MG IJ SOLR
20.0000 mg | Freq: Once | INTRAMUSCULAR | Status: AC
  Administered 2016-07-27: 20 mg via INTRAVENOUS
  Filled 2016-07-26: qty 1

## 2016-07-26 NOTE — Progress Notes (Signed)
Patient stated that he did not feel he needed suctioning at this time that he was "ok". Family in room and was made aware that if he needed anything that we would come back at anytime. Pt resting comfortably at this time, no distress noted. BBS slight rhonchi.

## 2016-07-26 NOTE — Progress Notes (Signed)
Late Entry  Patient has a suprapubic catheter and a PEG tube that have both been actively leaking all day. Patient's mom states this has been going on for over a month now. Suprapubic catheter is now no longer draining into drainage bag, but is just leaking all over the patient and the bed. Dr. Dyann Kief notified who contacted urology and received new orders. Will implement new orders and monitor. No new orders regarding PEG tube at this time.   Joellen Jersey, RN.

## 2016-07-26 NOTE — Progress Notes (Signed)
Pt [id number] arrived to unit around 1845 from 3S. Pt refused assessment at that time.

## 2016-07-26 NOTE — Progress Notes (Signed)
[facility] PROGRESS NOTE  ALESSIO MCQUILLIN [id number] DOB: 1986/02/21 DOA: 07/24/2016 PCP: [name], MD  Interim summary and HPI 30 y.o. 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 Level V caveat applies as patient is unable to provide reliable history at this time. History provided by patient's mother who is his primary caretaker. Symptoms started around 1600 on 07/23/2016. Patient was essentially unresponsive for family when they tried to wake him up. When patient would awake he endorsed being scared and would cry and acted confused with regards to his surroundings. Mother states that this happens to him from time to time typically when there is an underlying infectious process ongoing. Last Foley change 2 weeks ago. Per family there've been no recent focal complaints.   Assessment/Plan: Acute encephalopathy: likely from infectious etiology -Dx including HCAP, chronic sacral decubitus ulcers with osteomyelitis, UTI and bacteremia. No recent medication changes, no significant metabolic abnormalities or recent seizures. Chest x-ray concerning for right lower lobe pneumonia, UA grossly abnormal the patient is chronically cath, WBC 40 with left shift, afebrile currently. - Vancomycin and imipenem (given on admission -blood cx's with positive proteus and strep species -following Id rec's will use imipenem -follow final data on Blood culture, urine culture  SIRS/HCAP: Bacterial versus viral etiology. Trach dependent. Of note patient was supposed to be currently on doxycycline for tracheitis - but per mother report, medication  inconsistently given as [facility] services stop treatment after 7 days; no communication with physician obtained prior to medication discontinuation -D/C Doxy on admission - Pneumonia order set used - imipenem and vanc given on admission; now following preliminary blood cx's and Id rec's will continue just imipenem (pre-medication to be given to decrease chance of reactions/side effects)  UTI: Suprapubic cath in place in order to keep chronic sacral decubitus ulcers clean and dry. Urinalysis chronically abnormal. Per report by family catheter is due for change every 3 weeks (due now). Bladder scan showed bladder with mild leakage of urine around the urostomy site - Antibiotics as above (imipenem) - UCX pending  - Urostomy catheter exchange ordered on admission  - Urology consulted and Appreciate their input; plan is to try oxybutinin   Sacral decubitus ulcers/osteomyelitis: Chronic. Wound VAC in place for several weeks. Family endorses seeing Dr. Marla Roe. I have called her office and spoken to support staff directly about patient he stated they have not seen this patient in the clinic in years. They have seen the patient in the hospital from time to time. It appears that patient was scheduled to start with a new wound care team at [facility], Dr. Norberto Sorenson, [place] [phone]). MRI under anesthesia performed on 07/05/2016 showing "Mild marrow edema in the right posterior acetabulum with enhancement which may be reactive versus secondary to mild osteomyelitis." - Dr Marla Roe to see pt in consultation on 07/27/16; as on 07-25-16 wound vac dressing were already placed. - Continue wound VAC - will continue imipenem  - Consider repeat MRI, will defer to ID/plastic surgery if needed.  -Air overlay mattress ordered  - Continue wound care, to include collagenase ointment Monday Wednesday Friday   Anemia: Initial CBC showing hemoglobin of 6.6. Repeat labs showing 8.2. Patient's baseline around 9. No  reported GI loss. Suspect combination  of lab error and ongoing chronic disease and chronic oozing from sacral/decubitus ulcers. 1 unit PRBC ordered by EDP - Follow-up CBC to assess Hgb trend   Functional quadriplegia: Secondary to C6 ischemic injury several years ago. -continue supportive care -PT/OT for mobility support while inpatient; basically bed bound   Hyponatremia: 129. Chronic. - Normal saline IVF -will follow electrolytes trend   Adrenal insufficiency: No evidence of being in crisis. -Continue hydrocortisone and midodrine at current dose  Hypotension: Likely secondary to chronic adrenal insufficiency - Continue Midodrine and hydrocortisone  -cortisol level was 19 - IVF support as needed  Diabetes: Type 1. - Continue Lantus and SSI  Nutrition: Per report by mother/caregiver patient recently began eating full meals, requiring less PEG tube utilization.  -Nutritional service consulted for PEG tube nutrition  -will continue twelve hours a day of peg tube  - Allow oral intake as tolerated with close supervision to prevent aspiration   Chronic pain: Secondary to chronic wounds and muscular skeletal pain from contractures secondary to paralysis. - Continue fentanyl patch, oxycodone, Baclofen, Lyrica, Voltaren gel -will continue home medication regimen and if possible avoid extra doses of opiates  GERD: - Continue the PPI  Depression/Anxiety: -Continue Ativan, Wellbutrin -no SI or hallucinations appreciated on today's exam  Chronic constipation: - Continue bisacodyl  Code Status: Full Family Communication: mother at bedside  Disposition Plan: will transfer to med-surg; continue current IV antibiotics; follow culture data and ID rec's. Patient improving and doing better today.   Consultants:  ID  Urology   Procedures:  See below for x-ray reports   Antibiotics:  Vancomycin 10/17>>>10/17  Rocephin 10/17>> one dose given  Imipenem 10/17 one dose  given and resumed again on 10/18  HPI/Subjective: Afebrile, doing better overall today. Oriented X 2-3. Continue to have pain all over. No nausea no vomiting and tolerated diet well yesterday. WBC's continue trending down.   Objective: Vitals:   07/26/16 0700 07/26/16 0844  BP: 122/73   Pulse: 93 (!) 102  Resp: 16 (!) 21  Temp: 99.2 F (37.3 C)     Intake/Output Summary (Last 24 hours) at 07/26/16 0856 Last data filed at 07/26/16 0608  Gross per 24 hour  Intake          3721.67 ml  Output              550 ml  Net          3171.67 ml   Filed Weights   07/24/16 0109 07/24/16 2100  Weight: 59 kg (130 lb) 54.9 kg (121 lb 0.5 oz)    Exam:   General:  Afebrile, in no acute distress; able to follow simple commands and oriented X2-3; continue to expressed pain all over (but is not exactly new for him)   Cardiovascular: no rubs, no gallops, S1 and S2  Respiratory: scattered rhonchi, no crackles   Abdomen: soft, NT, ND, positive BS  Musculoskeletal: no edema or cyanosis   Skin: multiple wounds (affecting thigh left, pressure ulcer stage 4 sacral area and ischium, bilateral toes, calcaneous)  Data Reviewed: Basic Metabolic Panel:  Recent Labs Lab 07/24/16 0116 07/24/16 0209 07/25/16 1240 07/26/16 0612  NA 127* 129* 133* 133*  K 4.4 4.5 4.2 5.1  CL 100* 100* 108 108  CO2 17*  --  19* 20*  GLUCOSE 160* 157* 116* 221*  BUN 28* 30* 22* 24*  CREATININE 1.07 1.00 0.64 0.61  CALCIUM 8.4*  --  8.4* 7.8*   Liver Function  Tests:  Recent Labs Lab 07/24/16 0116  AST 17  ALT 16*  ALKPHOS 173*  BILITOT 0.7  PROT 6.7  ALBUMIN 1.7*    Recent Labs Lab 07/24/16 0116  AMMONIA 25   CBC:  Recent Labs Lab 07/24/16 0116 07/24/16 0209 07/25/16 1240 07/26/16 0612  WBC 40.0*  --  33.0* 27.9*  NEUTROABS 35.6*  --  28.7*  --   HGB 6.6* 8.2* 7.9* 7.7*  HCT 21.5* 24.0* 24.7* 25.1*  MCV 81.1  --  80.7 81.8  PLT 887*  --  847* 595*   CBG:  Recent Labs Lab  07/25/16 1612 07/25/16 2018 07/25/16 2338 07/26/16 0400 07/26/16 0844  GLUCAP 179* 279* 261* 210* 313*    Recent Results (from the past 240 hour(s))  Urine culture     Status: Abnormal   Collection Time: 07/24/16  1:35 AM  Result Value Ref Range Status   Specimen Description URINE, CLEAN CATCH  Final   Special Requests NONE  Final   Culture MULTIPLE SPECIES PRESENT, SUGGEST RECOLLECTION (A)  Final   Report Status 07/26/2016 FINAL  Final  Blood culture (routine x 2)     Status: Abnormal (Preliminary result)   Collection Time: 07/24/16  2:00 AM  Result Value Ref Range Status   Specimen Description BLOOD RIGHT HAND  Final   Special Requests IN PEDIATRIC BOTTLE 4ML  Final   Culture  Setup Time   Final    AEROBIC BOTTLE ONLY GRAM POSITIVE COCCI IN PAIRS CRITICAL RESULT CALLED TO, READ BACK BY AND VERIFIED WITH: A. MAYER, [place] ON 07/24/16 BY C. JESSUP, MLT. GRAM NEGATIVE RODS CRITICAL RESULT CALLED TO, READ BACK BY AND VERIFIED WITH: Karlene Einstein PHARMD, AT 1159 07/25/16    Culture (A)  Final    GROUP B STREP(S.AGALACTIAE)ISOLATED PROTEUS MIRABILIS SUSCEPTIBILITIES TO FOLLOW    Report Status PENDING  Incomplete  Blood Culture ID Panel (Reflexed)     Status: Abnormal   Collection Time: 07/24/16  2:00 AM  Result Value Ref Range Status   Enterococcus species NOT DETECTED NOT DETECTED Final   Listeria monocytogenes NOT DETECTED NOT DETECTED Final   Staphylococcus species NOT DETECTED NOT DETECTED Final   Staphylococcus aureus NOT DETECTED NOT DETECTED Final   Streptococcus species DETECTED (A) NOT DETECTED Final    Comment: CRITICAL RESULT CALLED TO, READ BACK BY AND VERIFIED WITH: A. MAYER, [place] ON 07/24/16 BY C. JESSUP, MLT.    Streptococcus agalactiae DETECTED (A) NOT DETECTED Final    Comment: CRITICAL RESULT CALLED TO, READ BACK BY AND VERIFIED WITH: A. MAYER, [place] ON 07/24/16 BY C. JESSUP, MLT.    Streptococcus pneumoniae NOT DETECTED NOT  DETECTED Final   Streptococcus pyogenes NOT DETECTED NOT DETECTED Final   Acinetobacter baumannii NOT DETECTED NOT DETECTED Final   Enterobacteriaceae species NOT DETECTED NOT DETECTED Final   Enterobacter cloacae complex NOT DETECTED NOT DETECTED Final   Escherichia coli NOT DETECTED NOT DETECTED Final   Klebsiella oxytoca NOT DETECTED NOT DETECTED Final   Klebsiella pneumoniae NOT DETECTED NOT DETECTED Final   Proteus species NOT DETECTED NOT DETECTED Final   Serratia marcescens NOT DETECTED NOT DETECTED Final   Haemophilus influenzae NOT DETECTED NOT DETECTED Final   Neisseria meningitidis NOT DETECTED NOT DETECTED Final   Pseudomonas aeruginosa NOT DETECTED NOT DETECTED Final   Candida albicans NOT DETECTED NOT DETECTED Final   Candida glabrata NOT DETECTED NOT DETECTED Final   Candida krusei NOT DETECTED NOT DETECTED  Final   Candida parapsilosis NOT DETECTED NOT DETECTED Final   Candida tropicalis NOT DETECTED NOT DETECTED Final  Blood culture (routine x 2)     Status: Abnormal (Preliminary result)   Collection Time: 07/24/16  4:05 AM  Result Value Ref Range Status   Specimen Description BLOOD RIGHT FOREARM  Final   Special Requests AEROBIC BOTTLE ONLY 5ML  Final   Culture  Setup Time   Final    AEROBIC BOTTLE ONLY GRAM POSITIVE COCCI IN PAIRS CRITICAL VALUE NOTED.  VALUE IS CONSISTENT WITH PREVIOUSLY REPORTED AND CALLED VALUE. GRAM NEGATIVE RODS    Culture STREPTOCOCCUS AGALACTIAE GRAM NEGATIVE RODS  (A)  Final   Report Status PENDING  Incomplete  Culture, respiratory (NON-Expectorated)     Status: None (Preliminary result)   Collection Time: 07/24/16 12:25 PM  Result Value Ref Range Status   Specimen Description TRACHEAL ASPIRATE  Final   Special Requests Normal  Final   Gram Stain   Final    MODERATE WBC PRESENT, PREDOMINANTLY PMN FEW GRAM POSITIVE COCCI IN PAIRS IN CLUSTERS RARE GRAM NEGATIVE COCCOBACILLI RARE GRAM NEGATIVE RODS RARE GRAM POSITIVE RODS    Culture  CULTURE REINCUBATED FOR BETTER GROWTH  Final   Report Status PENDING  Incomplete  MRSA PCR Screening     Status: Abnormal   Collection Time: 07/25/16  6:38 PM  Result Value Ref Range Status   MRSA by PCR POSITIVE (A) NEGATIVE Final    Comment:        The GeneXpert MRSA Assay (FDA approved for NASAL specimens only), is one component of a comprehensive MRSA colonization surveillance program. It is not intended to diagnose MRSA infection nor to guide or monitor treatment for MRSA infections. CRITICAL RESULT CALLED TO, READ BACK BY AND VERIFIED WITH: TJasper Riling, RN AT 2150 ON 07/25/16 BY C. JESSUP, MLT.      Studies: No results found.  Scheduled Meds: . albuterol  2.5 mg Nebulization BID  . baclofen  20 mg Oral QID  . buPROPion  150 mg Oral Daily  . Chlorhexidine Gluconate Cloth  6 each Topical Q0600  . docusate  100 mg Oral Daily  . famotidine (PEPCID) IV  20 mg Intravenous Q8H  . feeding supplement (GLUCERNA 1.2 CAL)  1,000 mL Per Tube Daily  . fentaNYL  75 mcg Transdermal Q72H  . free water  300-400 mL Per Tube Q8H  . heparin  5,000 Units Subcutaneous Q8H  . hydrocortisone  10 mg Oral Q24H  . hydrocortisone  20 mg Oral q morning - 10a  . imipenem-cilastatin  1,000 mg Intravenous Q8H   And  . methylPREDNISolone (SOLU-MEDROL) injection  20 mg Intravenous Q8H  . insulin aspart  0-5 Units Subcutaneous QHS  . insulin aspart  0-9 Units Subcutaneous TID WC  . insulin glargine  15 Units Subcutaneous QHS  . magnesium oxide  400 mg Oral BID  . mupirocin ointment  1 application Nasal BID  . oxybutynin  5 mg Per Tube BID  . pregabalin  100 mg Oral TID  . sodium chloride flush  3 mL Intravenous Q12H   Continuous Infusions: . sodium chloride Stopped (07/24/16 1242)  . 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 If 7PM-7AM, please contact night-coverage at [email or web address], password [facility] 07/26/2016, 8:56 AM  LOS: 2 days

## 2016-07-26 NOTE — Progress Notes (Signed)
Inpatient Diabetes Program Recommendations  AACE/ADA: New Consensus Statement on Inpatient Glycemic Control (2015)  Target Ranges:  Prepandial:   less than 140 mg/dL      Peak postprandial:   less than 180 mg/dL (1-2 hours)      Critically ill patients:  140 - 180 mg/dL   Lab Results  Component Value Date   GLUCAP 313 (H) 07/26/2016   HGBA1C 8.0 (H) 07/04/2016    Review of Glycemic Control Results for [name], [name] (MRN [id number]) as of 07/26/2016 09:40  Ref. Range 07/25/2016 08:24 07/25/2016 12:18 07/25/2016 16:12 07/25/2016 20:18 07/25/2016 23:38 07/26/2016 04:00 07/26/2016 08:44  Glucose-Capillary Latest Ref Range: 65 - 99 mg/dL 112 (H) 114 (H) 179 (H) 279 (H) 261 (H) 210 (H) 313 (H)   Diabetes history: DM1 Outpatient Diabetes medications: Lantus 15 units hs+ Novolog 0-10 units correction scale Novolog correction starting at 201 mg/dL Current orders for Inpatient glycemic control: Lantus 15 units hs + Novolog 0-9 units tid + 0-5 units hs  Inpatient Diabetes Program Recommendations:  Please consider adding meal coverage Novolog 2-3 units if eats 50%. Will follow.  Thank you, Jason [name]. Jason Murin, RN, MSN, CDE Inpatient Glycemic Control Team Team Pager [phone] (8am-5pm) 07/26/2016 10:00 AM

## 2016-07-26 NOTE — Care Management Note (Addendum)
Case Management Note  Patient Details  Name: Jason [name] MRN: [id number] Date of Birth: [birth year]  Subjective/Objective:   Acute encephalopathy, HCAP,  UTI, sacral decubitus ulcers/osteomyelitis               Action/Plan: Discharge Planning: NCM spoke to pt's mother, [name] at bedside. [name] states pt has [facility] 16 hours per day. Has power wheelchair, oxygen-provided by [facility], wound vac, hospital bed, and portable suction. Contacted [facility] DME rep and overbed table is out of pocket item. Mother was requesting overbed table, explained item is out of pocket. States she will attempt to purchase online.   07/27/2016 1131 Received call from [place] Team Coordinator, [name], [phone] to follow up on dc. Will need resumption of care orders for [facility].   Expected Discharge Date:                 Expected Discharge Plan:  [place]  In-House Referral:  NA  Discharge planning Services  CM Consult  Post Acute Care Choice:  Home Health, Resumption of Svcs/PTA Provider Choice offered to:  Parent  DME Arranged:  NA DME Agency:  NA  HH Arranged:  RN, Nurse's Aide HH Agency:  [facility]  Status of Service:  In process, will continue to follow  If discussed at Long Length of Stay Meetings, dates discussed:    Additional Comments:  [name], RN 07/26/2016, 10:47 AM

## 2016-07-26 NOTE — Progress Notes (Signed)
[facility]    Date of Admission:  07/24/2016   Total days of antibiotics 3      ID: Jason [name] is a 30 y.o. 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 He is awake, alert, answering questions. He denies fever, chills, increased shortness of breath or increased cough. He denies pain. He denies increased stool output.   Medications:  . albuterol  2.5 mg Nebulization BID  . baclofen  20 mg Oral QID  . buPROPion  150 mg Oral Daily  . Chlorhexidine Gluconate Cloth  6 each Topical Q0600  . docusate  100 mg Oral Daily  . famotidine (PEPCID) IV  20 mg Intravenous Q8H  . feeding supplement (GLUCERNA 1.2 CAL)  1,000 mL Per Tube Daily  . fentaNYL  75 mcg Transdermal Q72H  . free water  300-400 mL Per Tube Q8H  . heparin  5,000 Units Subcutaneous Q8H  . hydrocortisone  10 mg  Oral Q24H  . hydrocortisone  20 mg Oral q morning - 10a  . imipenem-cilastatin  500 mg Intravenous Q8H   And  . methylPREDNISolone (SOLU-MEDROL) injection  20 mg Intravenous Q8H  . insulin aspart  0-5 Units Subcutaneous QHS  . insulin aspart  0-9 Units Subcutaneous TID WC  . insulin glargine  15 Units Subcutaneous QHS  . magnesium oxide  400 mg Oral BID  . mupirocin ointment  1 application Nasal BID  . oxybutynin  5 mg Per Tube BID  . pregabalin  100 mg Oral TID  . sodium chloride flush  3 mL Intravenous Q12H    Objective: Vitals:   07/26/16 0400 07/26/16 0401 07/26/16 0700 07/26/16 0844  BP:  115/68 122/73   Pulse:  88 93 (!) 102  Resp:  13 16 (!) 21  Temp: 99.6 F (37.6 C)  99.2 F (37.3 C)   TempSrc: Oral  Axillary   SpO2:  100% 100% 100%  Weight:      Height:       General: Vital signs reviewed.  Patient is chronically ill appearing, in no acute distress and cooperative with exam.  Neck: Trach in place. Malodorous breath/sputum.  Cardiovascular: RRR Pulmonary/Chest: Mild rhonchorus breath sounds throughout. No wheezing or rales.  Abdominal: Soft, non-tender, non-distended, BS +, G-Tube in place, soft stool/liquid stool output. Suprapubic catheter in place. Neurological: Alert, awake, answers questions  Lab Results  Recent Labs  07/25/16 1240 07/26/16 0612  WBC 33.0* 27.9*  HGB 7.9* 7.7*  HCT  24.7* 25.1*  NA 133* 133*  K 4.2 5.1  CL 108 108  CO2 19* 20*  BUN 22* 24*  CREATININE 0.64 0.61   Liver Panel  Recent Labs  07/24/16 0116  PROT 6.7  ALBUMIN 1.7*  AST 17  ALT 16*  ALKPHOS 173*  BILITOT 0.7   Microbiology: UCx 10/17: Multiple species BCx 10/17: Strep Agalactiae (sensitive to ampicillin, ceftriaxone, clindamycin, levaquin, vancomycin) and Proteus (sensitive to ciprofloxacin, ampicillin, cefazolin, cefepime, ceftriaxone, bactrim, zosyn, imipenem and gentamicin)  Respiratory Cx 10/16: few gram positive cocci in pairs in clusters  Current  antibiotics: Vancomycin 10/17>> 10/17 Imipenem-Cilastatin 10/17 >> Cefempime 10/17 >> 10/17  Assessment/Plan: Jason [name] is a 30 yo male with PMHx of seizures, 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  Streptococcus Agalactiae and Proteus Bacteremia: Patient continues to improved clinically. Afebrile although fever curve is trending up. WBC has trended 40 to 27.9. Bacteremia could be secondary to underlying pneumonia (although no significant pulmonary symptoms and CXR ok), infection of chronic sacral decubitus ulcers/osteomyelitis, or UTI. We will await Urine Culture result and continue Imipenem for now. Given sensitivities and known patient allergies, could consider transitioning to cephalosporin only.  -Continue Imipenem-Cilastin, continue to pre-medicate -Will likely narrow to Cefepime and pre-medicate -Repeat BCx 10/19 >> pending -Repeat UCx given multiple species present on initial UCx -Repeat CBC/BMET tomorrow am -Plastic surgery to evaluate chronic sacral decubitus ulcers on 10/20 -Wound Care Following   Jason Malay, DO PGY-3 Internal Medicine Resident Pager # [phone] 07/26/2016 11:12 AM

## 2016-07-27 LAB — CBC
HEMATOCRIT: 25.9 % — AB (ref 39.0–52.0)
HEMOGLOBIN: 8 g/dL — AB (ref 13.0–17.0)
MCH: 25.7 pg — AB (ref 26.0–34.0)
MCHC: 30.9 g/dL (ref 30.0–36.0)
MCV: 83.3 fL (ref 78.0–100.0)
Platelets: 907 10*3/uL (ref 150–400)
RBC: 3.11 MIL/uL — ABNORMAL LOW (ref 4.22–5.81)
RDW: 16.1 % — ABNORMAL HIGH (ref 11.5–15.5)
WBC: 27.5 10*3/uL — ABNORMAL HIGH (ref 4.0–10.5)

## 2016-07-27 LAB — GLUCOSE, CAPILLARY
GLUCOSE-CAPILLARY: 506 mg/dL — AB (ref 65–99)
GLUCOSE-CAPILLARY: 369 mg/dL — AB (ref 65–99)
Glucose-Capillary: 505 mg/dL (ref 65–99)
Glucose-Capillary: 170 mg/dL — ABNORMAL HIGH (ref 65–99)

## 2016-07-27 LAB — PATHOLOGIST SMEAR REVIEW

## 2016-07-27 MED ORDER — INSULIN GLARGINE 100 UNIT/ML ~~LOC~~ SOLN
22.0000 [IU] | Freq: Every day | SUBCUTANEOUS | Status: DC
  Administered 2016-07-27 – 2016-07-31 (×5): 22 [IU] via SUBCUTANEOUS
  Filled 2016-07-27 (×7): qty 0.22

## 2016-07-27 MED ORDER — SODIUM CHLORIDE 0.9% FLUSH
10.0000 mL | INTRAVENOUS | Status: DC | PRN
  Administered 2016-07-28: 10 mL
  Filled 2016-07-27: qty 40

## 2016-07-27 MED ORDER — SODIUM CHLORIDE 0.9% FLUSH
10.0000 mL | Freq: Two times a day (BID) | INTRAVENOUS | Status: DC
  Administered 2016-07-28: 10 mL
  Administered 2016-07-29: 40 mL

## 2016-07-27 MED ORDER — INSULIN ASPART 100 UNIT/ML ~~LOC~~ SOLN: 0.0000 [IU] | Freq: Every day | SUBCUTANEOUS | Status: DC

## 2016-07-27 MED ORDER — INSULIN ASPART 100 UNIT/ML ~~LOC~~ SOLN
0.0000 [IU] | Freq: Three times a day (TID) | SUBCUTANEOUS | Status: DC
  Administered 2016-07-27: 15 [IU] via SUBCUTANEOUS
  Administered 2016-07-28: 3 [IU] via SUBCUTANEOUS
  Administered 2016-07-28 (×2): 5 [IU] via SUBCUTANEOUS
  Administered 2016-07-29 (×2): 2 [IU] via SUBCUTANEOUS
  Administered 2016-07-29: 3 [IU] via SUBCUTANEOUS
  Administered 2016-07-30: 2 [IU] via SUBCUTANEOUS
  Administered 2016-07-31: 3 [IU] via SUBCUTANEOUS
  Administered 2016-07-31: 2 [IU] via SUBCUTANEOUS

## 2016-07-27 NOTE — Consult Note (Signed)
WOC follow-up: EMR indicates plastics team will asssess sacrum and bilat ischial wounds today.  WOC can re-apply Vac dressing afterwards if the consult occurs before 3:30. Julien Girt MSN, RN, [place], [place], [place]

## 2016-07-27 NOTE — Progress Notes (Signed)
Peripherally Inserted Central Catheter/Midline Placement  The IV Nurse has discussed with the patient and/or persons authorized to consent for the patient, the purpose of this procedure and the potential benefits and risks involved with this procedure.  The benefits include less needle sticks, lab draws from the catheter, and the patient may be discharged home with the catheter. Risks include, but not limited to, infection, bleeding, blood clot (thrombus formation), and puncture of an artery; nerve damage and irregular heartbeat and possibility to perform a PICC exchange if needed/ordered by physician.  Alternatives to this procedure were also discussed.  Bard Power PICC patient education guide, fact sheet on infection prevention and patient information card has been provided to patient /or left at bedside.    PICC/Midline Placement Documentation  PICC Single Lumen [id number] PICC Right Basilic 41 cm 0 cm (Active)  Indication for Insertion or Continuance of Line Poor Vasculature-patient has had multiple peripheral attempts or PIVs lasting less than 24 hours 07/27/2016  8:46 PM  Exposed Catheter (cm) 0 cm 07/27/2016  8:46 PM  Site Assessment Clean;Dry;Intact 07/27/2016  8:46 PM  Line Status Flushed;Saline locked;Blood return noted 07/27/2016  8:46 PM  Dressing Type Transparent 07/27/2016  8:46 PM  Dressing Status Clean;Dry;Intact 07/27/2016  8:46 PM  Dressing Change Due 08/03/16 07/27/2016  8:46 PM       [name] 07/27/2016, 8:47 PM

## 2016-07-27 NOTE — Progress Notes (Signed)
Pt and family refused dressing change.

## 2016-07-27 NOTE — Progress Notes (Signed)
[facility] for Infectious Disease    Date of Admission:  07/24/2016   Total days of antibiotics 3      ID: Jason [name] is a 30 y.o. 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 He feels well overall and denies fever, chills, increased cough or shortness of breath. He wants to go home.  Medications:  . albuterol  2.5 mg Nebulization BID  . baclofen  20 mg Oral QID  . buPROPion  150 mg Oral Daily  . ceFEPime (MAXIPIME) IV  2 g Intravenous Q12H  . Chlorhexidine Gluconate Cloth  6 each Topical Q0600  . docusate  100 mg Oral Daily  . famotidine (PEPCID) IV  20 mg Intravenous Q12H  . feeding supplement (GLUCERNA 1.2 CAL)  1,000 mL Per Tube Daily  . fentaNYL  75 mcg Transdermal Q72H  . free water  300-400 mL Per Tube Q8H  . heparin  5,000 Units Subcutaneous Q8H  . hydrocortisone  10 mg Oral  Q24H  . hydrocortisone  20 mg Oral q morning - 10a  . insulin aspart  0-5 Units Subcutaneous QHS  . insulin aspart  0-9 Units Subcutaneous TID WC  . insulin glargine  15 Units Subcutaneous QHS  . magnesium oxide  400 mg Oral BID  . methylPREDNISolone (SOLU-MEDROL) injection  20 mg Intravenous Q12H  . mupirocin ointment  1 application Nasal BID  . oxybutynin  5 mg Per Tube BID  . pregabalin  100 mg Oral TID  . sodium chloride flush  3 mL Intravenous Q12H    Objective: Vitals:   07/27/16 0055 07/27/16 0415 07/27/16 0651 07/27/16 0837  BP:   134/87   Pulse: 88  98 98  Resp: 18  18 18   Temp:   98.3 F (36.8 C)   TempSrc:   Oral   SpO2: 99% 98% 99% 99%  Weight:      Height:       General: Vital signs reviewed.  Patient is chronically ill appearing, in no acute distress and cooperative with exam.  Neck: Trach in place. Cardiovascular: RRR Pulmonary/Chest: CTA b/l. No rhonchi, wheezing or rales.  Abdominal: Soft, non-tender, non-distended, BS +, G-Tube in place and suprapubic catheter in place- both with evidence of leakage. Neurological: Alert, awake, answers questions  Lab Results  Recent Labs  07/25/16 1240 07/26/16 0612 07/27/16 0742  WBC 33.0* 27.9* 27.5*  HGB 7.9* 7.7* 8.0*  HCT 24.7* 25.1* 25.9*  NA 133* 133*  --   K  4.2 5.1  --   CL 108 108  --   CO2 19* 20*  --   BUN 22* 24*  --   CREATININE 0.64 0.61  --    Microbiology: UCx 10/17: Multiple species BCx 10/17: Strep Agalactiae and Proteus  Respiratory Cx 10/16: few gram positive cocci in pairs in clusters BCx 10/19 >> NGTD  Current antibiotics: Vancomycin 10/17>> 10/17 Imipenem-Cilastatin 10/17 >> 10/19 Cefepime 10/17 >> 10/17; 10/19 >>  Assessment/Plan: Mr. 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  Streptococcus Agalactiae and Proteus Bacteremia: Patient continues to improve clinically. Afebrile, WBC has trended 40 >27.9> 27.5. Bacteremia likely secondary to underlying UTI or infection of chronic sacral decubitus ulcers/osteomyelitis. Patient was transitioned to Cefepime on 10/19 and will need at least a 2 week course. -Continue Cefepime and pre-medicate  -Repeat BCx 10/19 >> NGTD -Will need PICC line if BCx remain NG -Plans for Urology to replace suprapubic catheter -Plastic surgery to evaluate chronic sacral decubitus ulcers on 10/20 -Wound Care Following   Martyn Malay, DO PGY-3 Internal Medicine Resident Pager # [phone] 07/27/2016 11:29 AM

## 2016-07-27 NOTE — Care Management Note (Addendum)
Case Management Note  Patient Details  Name: Jason [name] MRN: [id number] Date of Birth: [birth year]  Subjective/Objective:                    Action/Plan: PICC pending repeat culture results. Pt will need iv home infusion. Choice offered to pt/mom regarding IV home infusion, [facility] selected. [facility] is to supply pt with medication for IV home infusion and pump.   Expected Discharge Date:                  Expected Discharge Plan:  [place]  In-House Referral:  NA  Discharge planning Services  CM Consult  Post Acute Care Choice:  Home Health, Resumption of Svcs/PTA Provider Choice offered to:  Parent, Patient  DME Arranged:  IV pump/equipment/ Referral made  with Butch Penny @ [phone]. [facility] to supply medication for IV home infusion. DME Agency:  [facility] Arranged:  RN, Nurse's Aide [facility] Agency:  [facility]/ Please call Caffie Damme Team Coordinator) @ [phone] to inform them of  pt's d/c.  Status of Service:  In process, will continue to follow  If discussed at Long Length of Stay Meetings, dates discussed:    Additional Comments:  Sharin Mons, RN 07/27/2016, 12:13 PM

## 2016-07-27 NOTE — Progress Notes (Signed)
Trach changed per routine trach change schedule. Original trach was removed without problem and new #6 shiley cuffed trach inserted without problem. Positive color change on ETCO2 confirmed placement. New drain sponge and trach ties applied. Speaking valve replaced. Pt stable, no distress noted. Will cont to monitor

## 2016-07-27 NOTE — Consult Note (Signed)
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  Photos were sent using the patient's phone, after his permission was obtained, to Dr Marla Roe of the plastics team. Wounds are unchanged in appearance since previous assessment.  Pt tolerated dressing change without distress, applied one piece of black foam to each wound and bridged all sites together to 174mm cont suction.  Barrier ring applied around lower wound edges to maintain a seal.  Plan to change dressing on Monday. Julien Girt MSN, RN, [place], [place], [place]

## 2016-07-27 NOTE — Progress Notes (Signed)
[facility] PROGRESS NOTE  Jason [name] [id number] DOB: December 07, 1985 DOA: 07/24/2016 PCP: [name], MD  Interim summary and HPI 30 y.o. 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 Level V caveat applies as patient is unable to provide reliable history at this time. History provided by patient's mother who is his primary caretaker. Symptoms started around 1600 on 07/23/2016. Patient was essentially unresponsive for family when they tried to wake him up. When patient would awake he endorsed being scared and would cry and acted confused with regards to his surroundings. Mother states that this happens to him from time to time typically when there is an underlying infectious process ongoing. Last Foley change 2 weeks ago. Per family there've been no recent focal complaints.   Assessment/Plan: Acute encephalopathy: likely from infectious etiology -Dx including HCAP, chronic sacral decubitus ulcers with osteomyelitis, UTI and bacteremia. No recent medication changes, no significant metabolic abnormalities or recent seizures. Chest x-ray concerning for right lower lobe pneumonia, UA grossly abnormal the patient is chronically cath, WBC 40 with left shift, afebrile currently. -Vancomycin and imipenem (given on admission) -blood cx's with positive proteus and strep species -following Id rec's will use cefepime now (see below) -following final culture data will treat for 2 weeks using cefepime -mentation is essentially back to baseline.  SIRS/HCAP/bacteremia: Bacterial versus viral etiology. Trach dependent. Of note patient was supposed to  be currently on doxycycline for tracheitis - but per mother report, medication inconsistently given as [place] services stop treatment after 7 days; no communication with physician obtained prior to medication discontinuation -D/C Doxy on admission - Pneumonia order set used - imipenem and vanc given on admission; now following blood cx's and Id rec's will continue just with cefepime (planning to treat for 2 weeks); pre-medication to be given to decrease chance of reactions/side effects.  UTI: Suprapubic cath in place in order to keep chronic sacral decubitus ulcers clean and dry. Urinalysis chronically abnormal. Per report by family catheter is due for change every 3 weeks (due now). Bladder scan showed bladder with mild leakage of urine around the urostomy site - Antibiotics as above (imipenem); now switched to cefepime  - UCX demonstrating multiple species - Urostomy catheter exchange ordered on admission  - Urology consulted and Appreciate their input; plan is to try oxybutinin -will need also exchange of suprapubic catheter    Sacral decubitus ulcers/osteomyelitis: Chronic. Wound VAC in place for several weeks. Family endorses seeing Dr. Marla Roe. I have called her office and spoken to support staff directly about patient he stated they have not seen this patient in the clinic in years. They have seen the patient in the hospital from time to time. It appears that patient was scheduled to start with a new wound care team at [place], Dr. Norberto Sorenson, [place] [phone]). MRI under anesthesia performed on 07/05/2016 showing "Mild marrow edema in the right posterior acetabulum with enhancement which may be reactive versus secondary to mild osteomyelitis." - Dr Marla Roe to see pt in consultation; will follow any further rec's - Continue wound VAC - will continue imipenem  - Consideration to repeat MRI, will defer to ID/plastic surgery if needed.  -Air overlay mattress ordered  -Continue wound  care, to include collagenase ointment Monday  Wednesday Friday   Anemia: Initial CBC showing hemoglobin of 6.6. Repeat labs showing 8.2. Patient's baseline around 9. No reported GI loss. Suspect combination of lab error and ongoing chronic disease and chronic oozing from sacral/decubitus ulcers.  -1 unit PRBC ordered by EDP - Follow-up CBC to assess Hgb trend  -Hgb 8.0 (on 10/20)  Functional quadriplegia: Secondary to C6 ischemic injury several years ago. -continue supportive care -PT/OT for mobility support while inpatient; basically bed bound   Hyponatremia: 129. Chronic. -corrected after Normal saline IVF infusion -will follow electrolytes trend  -last Na 133  Adrenal insufficiency: No evidence of being in crisis. -Continue hydrocortisone and midodrine at current dose  Hypotension: Likely secondary to chronic adrenal insufficiency - Continue Midodrine and hydrocortisone  -cortisol level was 19 - IVF support as needed  Diabetes: Type 1. - Continue Lantus and SSI -hypoglycemic regimen adjusted; due to tube feedings and improve appetite CBG's has been running high   Nutrition: Per report by mother/caregiver patient recently began eating full meals, requiring less PEG tube utilization.  -Nutritional service consulted for PEG tube nutrition  -will continue twelve hours a day of peg tube  - Allow oral intake as tolerated with close supervision to prevent aspiration   Chronic pain: Secondary to chronic wounds and muscular skeletal pain from contractures secondary to paralysis. - Continue fentanyl patch, oxycodone, Baclofen, Lyrica, Voltaren gel -will continue home medication regimen and if possible avoid extra doses of opiates  GERD: - Continue the PPI  Depression/Anxiety: -Continue Ativan, Wellbutrin -no SI or hallucinations appreciated on today's exam  Chronic constipation: - Continue bisacodyl  Code Status: Full Family Communication: mother at bedside   Disposition Plan: will remain inpatient; abx's switched to cefepime as per Id rec's; tracheostomy has been exchanged.   Consultants:  ID  Urology   Procedures:  See below for x-ray reports   Antibiotics:  Vancomycin 10/17>>>10/17  Rocephin 10/17>> one dose given  Imipenem 10/17 one dose given and resumed again on 10/18>>10/20  Cefepime 10/20 (looking to treat for 2 weeks)  HPI/Subjective: Afebrile, doing better overall and in no acute distress. Oriented X 3. Continue to have pain all over. Improved appetite   Objective: Vitals:   07/27/16 1203 07/27/16 1700  BP:    Pulse: 100 100  Resp: 18 19  Temp:      Intake/Output Summary (Last 24 hours) at 07/27/16 1811 Last data filed at 07/27/16 0500  Gross per 24 hour  Intake              750 ml  Output              200 ml  Net              550 ml   Filed Weights   07/24/16 0109 07/24/16 2100 07/26/16 1935  Weight: 59 kg (130 lb) 54.9 kg (121 lb 0.5 oz) 61.2 kg (135 lb)    Exam:   General:  Afebrile, in no major distress; able to follow simple commands and oriented X3; continue to expressed pain all over (but is not exactly new for him). According to mother close to his baseline.   Cardiovascular: no rubs, no gallops, S1 and S2  Respiratory: scattered rhonchi, no crackles   Abdomen: soft, NT, ND, positive BS  Musculoskeletal: no edema or cyanosis   Skin: multiple wounds (affecting thigh left, pressure ulcer stage 4 sacral area and ischium, bilateral toes, calcaneous)  Data Reviewed: Basic Metabolic Panel:  Recent Labs Lab  07/24/16 0116 07/24/16 0209 07/25/16 1240 07/26/16 0612  NA 127* 129* 133* 133*  K 4.4 4.5 4.2 5.1  CL 100* 100* 108 108  CO2 17*  --  19* 20*  GLUCOSE 160* 157* 116* 221*  BUN 28* 30* 22* 24*  CREATININE 1.07 1.00 0.64 0.61  CALCIUM 8.4*  --  8.4* 7.8*   Liver Function Tests:  Recent Labs Lab 07/24/16 0116  AST 17  ALT 16*  ALKPHOS 173*  BILITOT 0.7  PROT 6.7   ALBUMIN 1.7*    Recent Labs Lab 07/24/16 0116  AMMONIA 25   CBC:  Recent Labs Lab 07/24/16 0116 07/24/16 0209 07/25/16 1240 07/26/16 0612 07/27/16 0742  WBC 40.0*  --  33.0* 27.9* 27.5*  NEUTROABS 35.6*  --  28.7*  --   --   HGB 6.6* 8.2* 7.9* 7.7* 8.0*  HCT 21.5* 24.0* 24.7* 25.1* 25.9*  MCV 81.1  --  80.7 81.8 83.3  PLT 887*  --  847* 595* 907*   CBG:  Recent Labs Lab 07/26/16 1524 07/26/16 2309 07/27/16 0826 07/27/16 1239 07/27/16 1642  GLUCAP 310* 398* 506* 505* 369*    Recent Results (from the past 240 hour(s))  Urine culture     Status: Abnormal   Collection Time: 07/24/16  1:35 AM  Result Value Ref Range Status   Specimen Description URINE, CLEAN CATCH  Final   Special Requests NONE  Final   Culture MULTIPLE SPECIES PRESENT, SUGGEST RECOLLECTION (A)  Final   Report Status 07/26/2016 FINAL  Final  Blood culture (routine x 2)     Status: Abnormal   Collection Time: 07/24/16  2:00 AM  Result Value Ref Range Status   Specimen Description BLOOD RIGHT HAND  Final   Special Requests IN PEDIATRIC BOTTLE 4ML  Final   Culture  Setup Time   Final    AEROBIC BOTTLE ONLY GRAM POSITIVE COCCI IN PAIRS CRITICAL RESULT CALLED TO, READ BACK BY AND VERIFIED WITH: A. MAYER, [place] ON 07/24/16 BY C. JESSUP, MLT. GRAM NEGATIVE RODS CRITICAL RESULT CALLED TO, READ BACK BY AND VERIFIED WITH: Karlene Einstein PHARMD, AT 1159 07/25/16    Culture (A)  Final    GROUP B STREP(S.AGALACTIAE)ISOLATED PROTEUS MIRABILIS    Report Status 07/26/2016 FINAL  Final   Organism ID, Bacteria GROUP B STREP(S.AGALACTIAE)ISOLATED  Final   Organism ID, Bacteria PROTEUS MIRABILIS  Final      Susceptibility   Group b strep(s.agalactiae)isolated - MIC*    CLINDAMYCIN <=0.25 SENSITIVE Sensitive     AMPICILLIN <=0.25 SENSITIVE Sensitive     ERYTHROMYCIN 4 RESISTANT Resistant     VANCOMYCIN 0.5 SENSITIVE Sensitive     CEFTRIAXONE <=0.12 SENSITIVE Sensitive     LEVOFLOXACIN 0.5  SENSITIVE Sensitive     * GROUP B STREP(S.AGALACTIAE)ISOLATED   Proteus mirabilis - MIC*    AMPICILLIN <=2 SENSITIVE Sensitive     CEFAZOLIN 8 SENSITIVE Sensitive     CEFEPIME <=1 SENSITIVE Sensitive     CEFTAZIDIME <=1 SENSITIVE Sensitive     CEFTRIAXONE <=1 SENSITIVE Sensitive     CIPROFLOXACIN <=0.25 SENSITIVE Sensitive     GENTAMICIN <=1 SENSITIVE Sensitive     IMIPENEM 2 SENSITIVE Sensitive     TRIMETH/SULFA <=20 SENSITIVE Sensitive     AMPICILLIN/SULBACTAM <=2 SENSITIVE Sensitive     PIP/TAZO <=4 SENSITIVE Sensitive     * PROTEUS MIRABILIS  Blood Culture ID Panel (Reflexed)     Status: Abnormal   Collection Time: 07/24/16  2:00  AM  Result Value Ref Range Status   Enterococcus species NOT DETECTED NOT DETECTED Final   Listeria monocytogenes NOT DETECTED NOT DETECTED Final   Staphylococcus species NOT DETECTED NOT DETECTED Final   Staphylococcus aureus NOT DETECTED NOT DETECTED Final   Streptococcus species DETECTED (A) NOT DETECTED Final    Comment: CRITICAL RESULT CALLED TO, READ BACK BY AND VERIFIED WITH: A. MAYER, [place] ON 07/24/16 BY C. JESSUP, MLT.    Streptococcus agalactiae DETECTED (A) NOT DETECTED Final    Comment: CRITICAL RESULT CALLED TO, READ BACK BY AND VERIFIED WITH: A. MAYER, [place] ON 07/24/16 BY C. JESSUP, MLT.    Streptococcus pneumoniae NOT DETECTED NOT DETECTED Final   Streptococcus pyogenes NOT DETECTED NOT DETECTED Final   Acinetobacter baumannii NOT DETECTED NOT DETECTED Final   Enterobacteriaceae species NOT DETECTED NOT DETECTED Final   Enterobacter cloacae complex NOT DETECTED NOT DETECTED Final   Escherichia coli NOT DETECTED NOT DETECTED Final   Klebsiella oxytoca NOT DETECTED NOT DETECTED Final   Klebsiella pneumoniae NOT DETECTED NOT DETECTED Final   Proteus species NOT DETECTED NOT DETECTED Final   Serratia marcescens NOT DETECTED NOT DETECTED Final   Haemophilus influenzae NOT DETECTED NOT DETECTED Final   Neisseria  meningitidis NOT DETECTED NOT DETECTED Final   Pseudomonas aeruginosa NOT DETECTED NOT DETECTED Final   Candida albicans NOT DETECTED NOT DETECTED Final   Candida glabrata NOT DETECTED NOT DETECTED Final   Candida krusei NOT DETECTED NOT DETECTED Final   Candida parapsilosis NOT DETECTED NOT DETECTED Final   Candida tropicalis NOT DETECTED NOT DETECTED Final  Blood culture (routine x 2)     Status: Abnormal   Collection Time: 07/24/16  4:05 AM  Result Value Ref Range Status   Specimen Description BLOOD RIGHT FOREARM  Final   Special Requests AEROBIC BOTTLE ONLY 5ML  Final   Culture  Setup Time   Final    AEROBIC BOTTLE ONLY GRAM POSITIVE COCCI IN PAIRS CRITICAL VALUE NOTED.  VALUE IS CONSISTENT WITH PREVIOUSLY REPORTED AND CALLED VALUE. GRAM NEGATIVE RODS    Culture (A)  Final    GROUP B STREP(S.AGALACTIAE)ISOLATED PROTEUS MIRABILIS SUSCEPTIBILITIES PERFORMED ON PREVIOUS CULTURE WITHIN THE LAST 5 DAYS.    Report Status 07/26/2016 FINAL  Final  Culture, respiratory (NON-Expectorated)     Status: None   Collection Time: 07/24/16 12:25 PM  Result Value Ref Range Status   Specimen Description TRACHEAL ASPIRATE  Final   Special Requests Normal  Final   Gram Stain   Final    MODERATE WBC PRESENT, PREDOMINANTLY PMN FEW GRAM POSITIVE COCCI IN PAIRS IN CLUSTERS RARE GRAM NEGATIVE COCCOBACILLI RARE GRAM NEGATIVE RODS RARE GRAM POSITIVE RODS    Culture MULTIPLE ORGANISMS PRESENT, NONE PREDOMINANT  Final   Report Status 07/26/2016 FINAL  Final  MRSA PCR Screening     Status: Abnormal   Collection Time: 07/25/16  6:38 PM  Result Value Ref Range Status   MRSA by PCR POSITIVE (A) NEGATIVE Final    Comment:        The GeneXpert MRSA Assay (FDA approved for NASAL specimens only), is one component of a comprehensive MRSA colonization surveillance program. It is not intended to diagnose MRSA infection nor to guide or monitor treatment for MRSA infections. CRITICAL RESULT CALLED TO,  READ BACK BY AND VERIFIED WITH: TJasper Riling, RN AT 2150 ON 07/25/16 BY C. JESSUP, MLT.   Culture, blood (routine x 2)     Status: None (  Preliminary result)   Collection Time: 07/26/16  6:15 AM  Result Value Ref Range Status   Specimen Description BLOOD LEFT ANTECUBITAL  Final   Special Requests IN PEDIATRIC BOTTLE  4CC  Final   Culture NO GROWTH 1 DAY  Final   Report Status PENDING  Incomplete  Culture, blood (routine x 2)     Status: None (Preliminary result)   Collection Time: 07/26/16  6:23 AM  Result Value Ref Range Status   Specimen Description BLOOD LEFT ANTECUBITAL  Final   Special Requests IN PEDIATRIC BOTTLE  3CC  Final   Culture NO GROWTH 1 DAY  Final   Report Status PENDING  Incomplete     Studies: No results found.  Scheduled Meds: . albuterol  2.5 mg Nebulization BID  . baclofen  20 mg Oral QID  . buPROPion  150 mg Oral Daily  . ceFEPime (MAXIPIME) IV  2 g Intravenous Q12H  . Chlorhexidine Gluconate Cloth  6 each Topical Q0600  . docusate  100 mg Oral Daily  . famotidine (PEPCID) IV  20 mg Intravenous Q12H  . feeding supplement (GLUCERNA 1.2 CAL)  1,000 mL Per Tube Daily  . fentaNYL  75 mcg Transdermal Q72H  . free water  300-400 mL Per Tube Q8H  . heparin  5,000 Units Subcutaneous Q8H  . hydrocortisone  10 mg Oral Q24H  . hydrocortisone  20 mg Oral q morning - 10a  . insulin aspart  0-15 Units Subcutaneous TID WC  . insulin aspart  0-5 Units Subcutaneous QHS  . insulin glargine  22 Units Subcutaneous QHS  . magnesium oxide  400 mg Oral BID  . methylPREDNISolone (SOLU-MEDROL) injection  20 mg Intravenous Q12H  . mupirocin ointment  1 application Nasal BID  . oxybutynin  5 mg Per Tube BID  . pregabalin  100 mg Oral TID  . sodium chloride flush  3 mL Intravenous Q12H   Continuous Infusions: . sodium chloride Stopped (07/24/16 1242)  . 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 If 7PM-7AM, please contact night-coverage at [email or web address], password [facility] 07/27/2016, 6:11 PM  LOS: 3 days

## 2016-07-28 DIAGNOSIS — D638 Anemia in other chronic diseases classified elsewhere: Secondary | ICD-10-CM

## 2016-07-28 LAB — CBC
HEMATOCRIT: 25.4 % — AB (ref 39.0–52.0)
Hemoglobin: 7.9 g/dL — ABNORMAL LOW (ref 13.0–17.0)
MCH: 25.6 pg — AB (ref 26.0–34.0)
MCHC: 31.1 g/dL (ref 30.0–36.0)
MCV: 82.2 fL (ref 78.0–100.0)
PLATELETS: 766 10*3/uL — AB (ref 150–400)
RBC: 3.09 MIL/uL — ABNORMAL LOW (ref 4.22–5.81)
RDW: 15.9 % — AB (ref 11.5–15.5)
WBC: 31.2 10*3/uL — ABNORMAL HIGH (ref 4.0–10.5)

## 2016-07-28 LAB — GLUCOSE, CAPILLARY
GLUCOSE-CAPILLARY: 188 mg/dL — AB (ref 65–99)
GLUCOSE-CAPILLARY: 247 mg/dL — AB (ref 65–99)
GLUCOSE-CAPILLARY: 174 mg/dL — AB (ref 65–99)
Glucose-Capillary: 232 mg/dL — ABNORMAL HIGH (ref 65–99)

## 2016-07-28 IMAGING — CT CT ABD-PELV W/O CM
2 of 4 series · 17 of 46 positions shown, 19 images · non-contrast
Comparison: 09/22/2013

CLINICAL DATA: Hydronephrosis, UTIs, status post Levaquin for
pneumonia

EXAM:
CT ABDOMEN AND PELVIS WITHOUT CONTRAST
TECHNIQUE: Multidetector CT imaging of the abdomen and pelvis was performed
following the standard protocol without IV contrast.

[Series 3: under 200# stone no prev · axial · 0.68mm/px · z∈[-464,-54]mm · 14 of 90 slices shown, 16 images]
[im 4/90  soft-tissue]
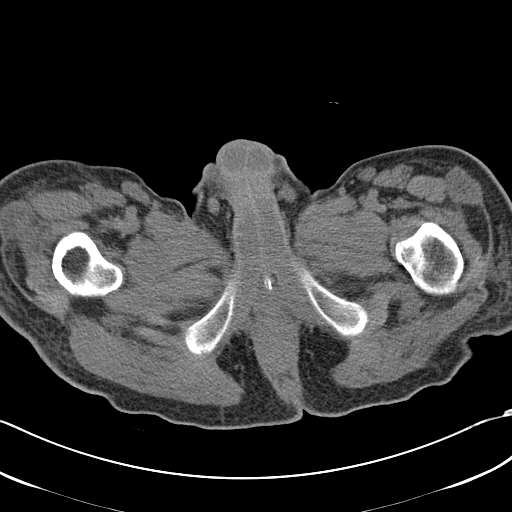
[im 4/90  bone]
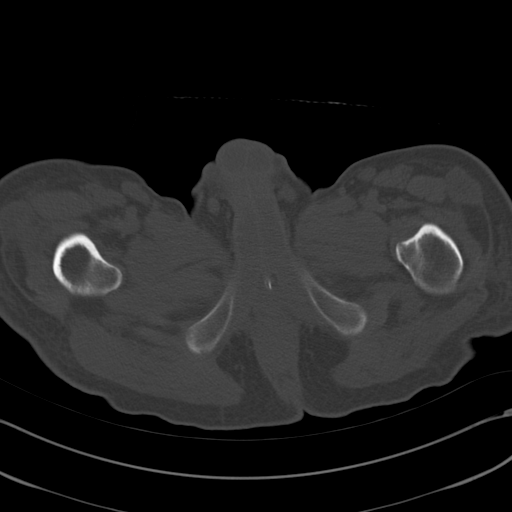
[im 11/90  soft-tissue]
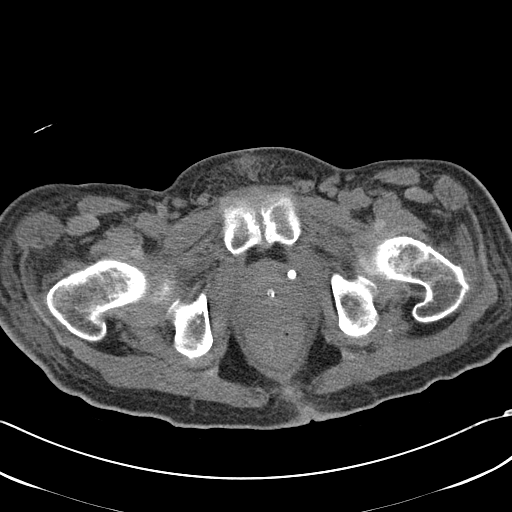
[im 18/90  soft-tissue]
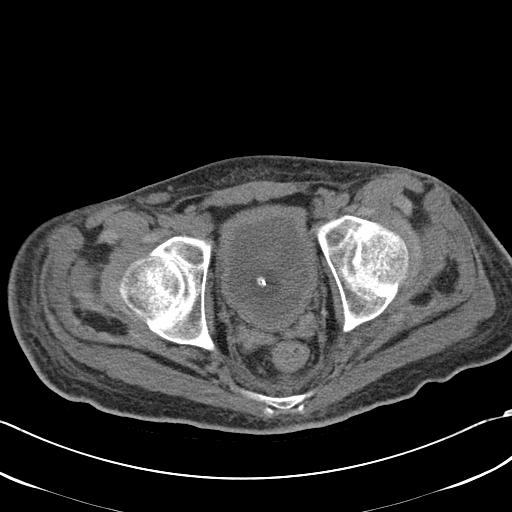
[im 25/90  soft-tissue]
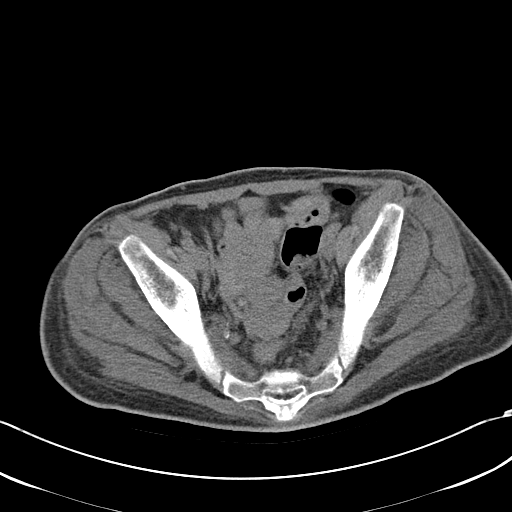
[im 29/90  soft-tissue]
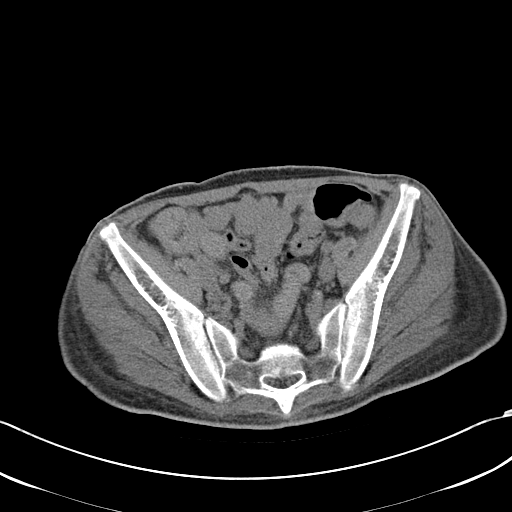
[im 36/90  soft-tissue]
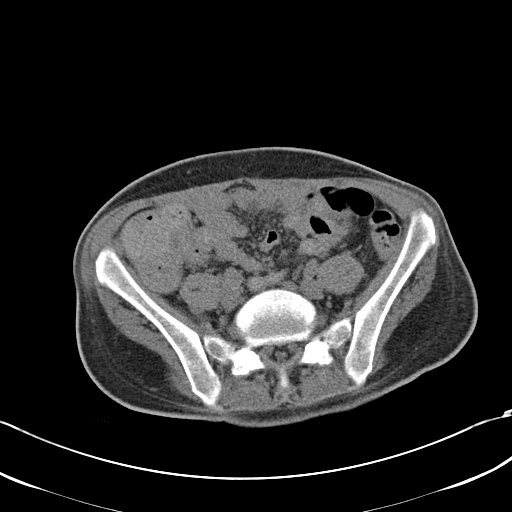
[im 43/90  soft-tissue]
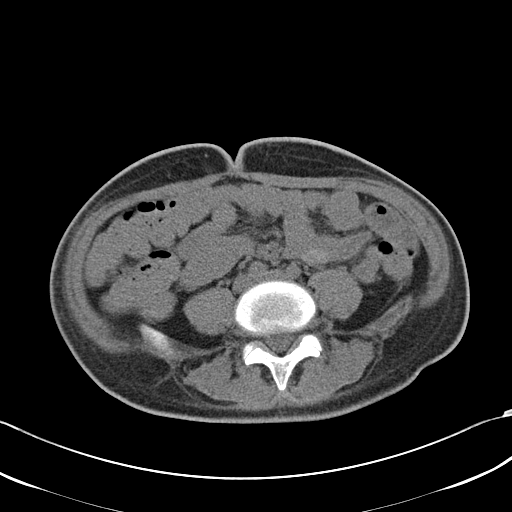
[im 47/90  soft-tissue]
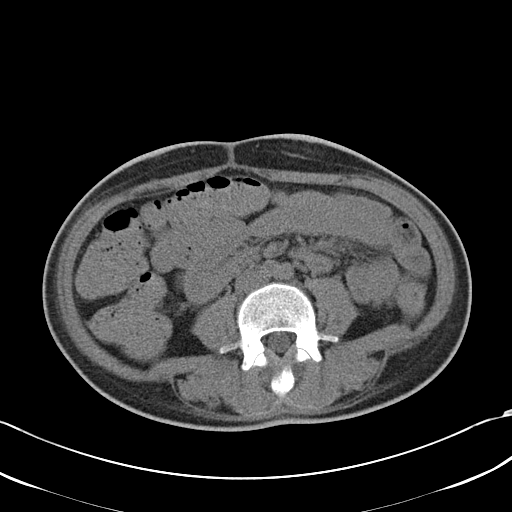
[im 54/90  soft-tissue]
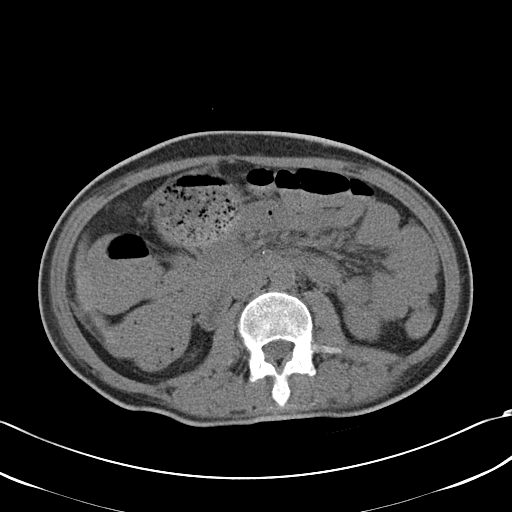
[im 54/90  bone]
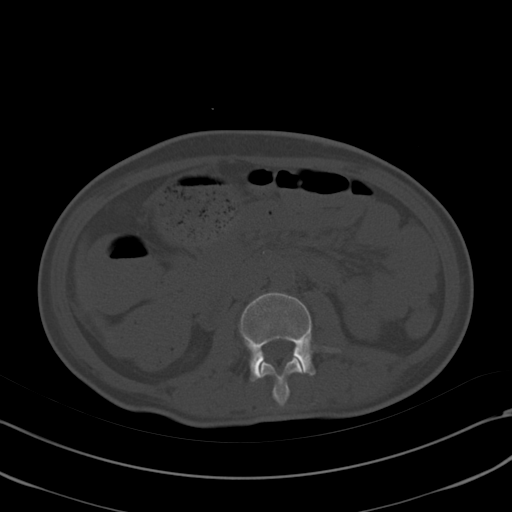
[im 61/90  soft-tissue]
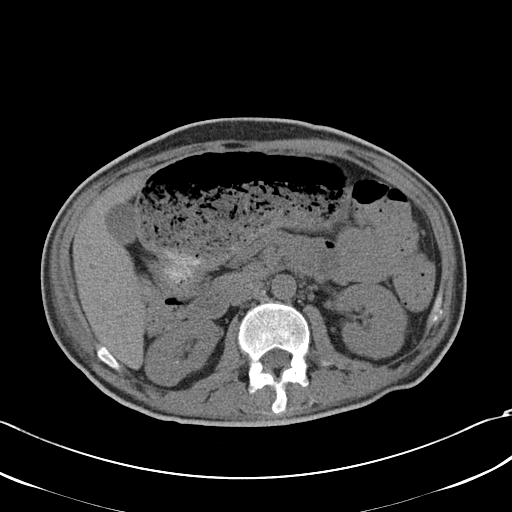
[im 68/90  soft-tissue]
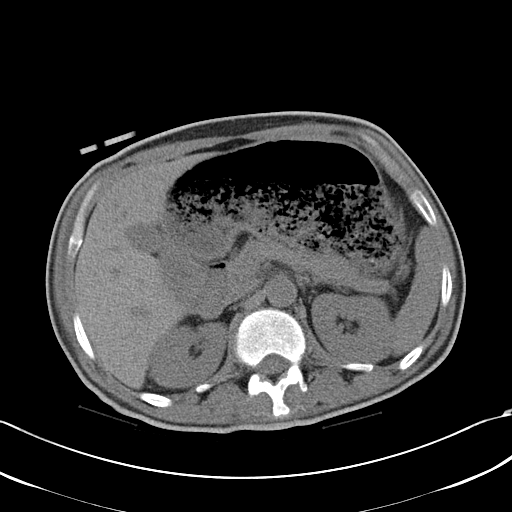
[im 72/90  soft-tissue]
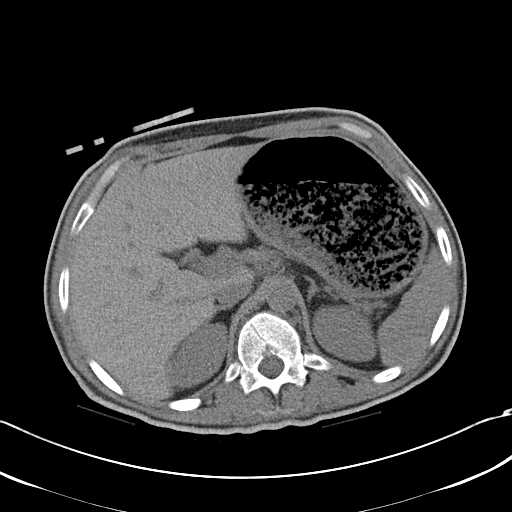
[im 79/90  soft-tissue]
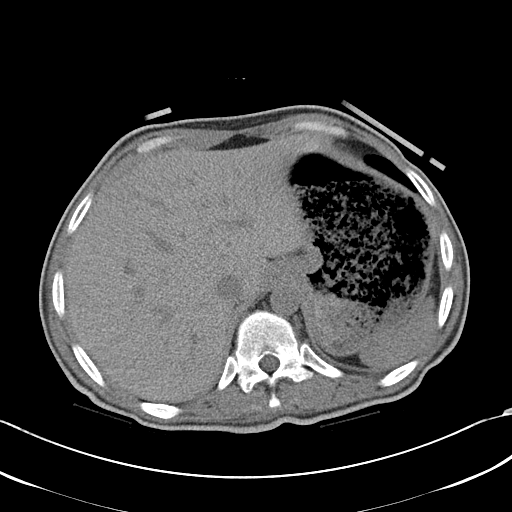
[im 86/90  soft-tissue]
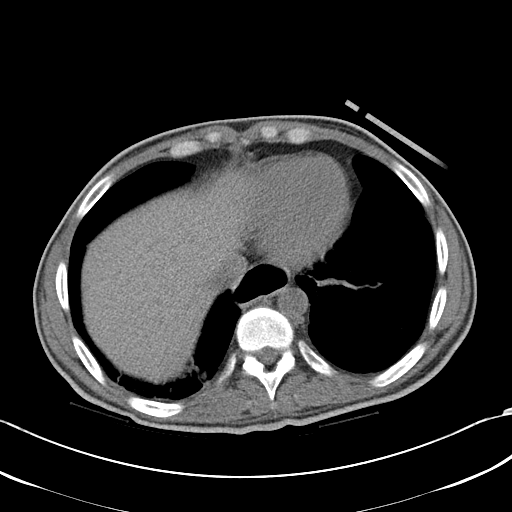

[Series 602: coronal · coronal · 0.88mm/px · 3 of 69 slices shown]
[im 23/69  soft-tissue]
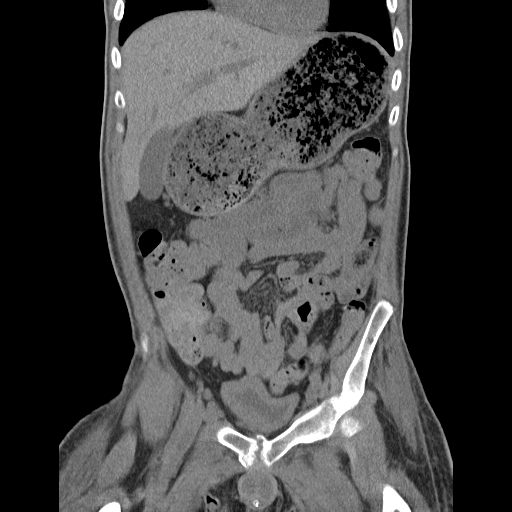
[im 31/69  soft-tissue]
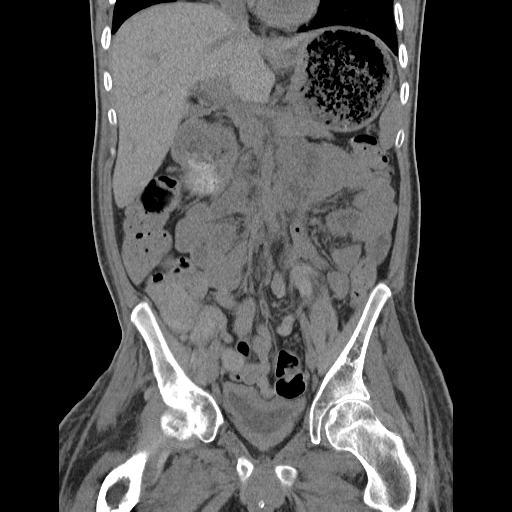
[im 38/69  soft-tissue]
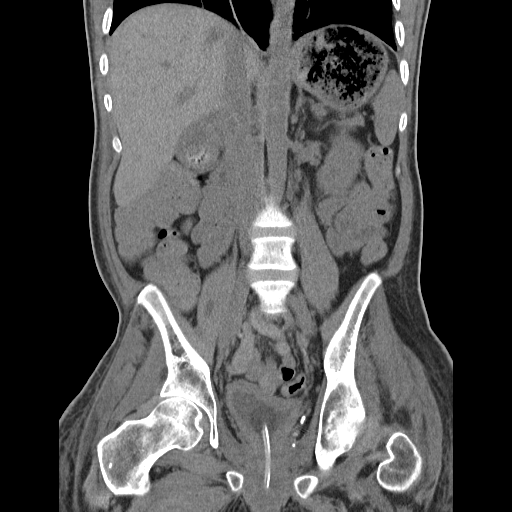

[17 of 46 positions shown; findings below may reference images not displayed]

FINDINGS: Mild ground-glass nodularity at the lung bases, likely post
infectious/inflammatory.

Layering fluid within a distal esophagus/hiatal hernia (series
3/image 1).

Unenhanced liver, spleen, pancreas, and adrenal glands within normal
limits.

Gallbladder is unremarkable. No intrahepatic or extrahepatic ductal
dilatation.

Right kidney is notable for a faint 3 mm interpolar calcification
(coronal image 51) and a 2 mm nonobstructing right lower pole renal
calculus (coronal image 54). Left kidney is notable for a faint 3 mm
interpolar calcification (coronal image 43). No hydronephrosis.

No evidence of bowel obstruction.  Normal appendix.

No evidence of abdominal aortic aneurysm.

No abdominopelvic ascites.

No suspicious abdominopelvic lymphadenopathy.

Prostatomegaly with enlargement of the central gland, suggesting
BPH.

Bladder is thick-walled with indwelling Foley catheter.

Visualized osseous structures are within normal limits.
IMPRESSION: Bilateral nonobstructing renal calculi measuring up to 3 mm. No
ureteral or bladder calculi. No hydronephrosis.

Prostatomegaly with enlargement of the central gland, suggesting
BPH.

Thick-walled bladder with indwelling Foley catheter.

Additional ancillary findings, as above

## 2016-07-28 MED ORDER — OXYBUTYNIN CHLORIDE 5 MG PO TABS
5.0000 mg | ORAL_TABLET | Freq: Two times a day (BID) | ORAL | Status: DC
  Administered 2016-07-28 – 2016-08-01 (×8): 5 mg
  Filled 2016-07-28 (×8): qty 1

## 2016-07-28 NOTE — Progress Notes (Signed)
Spoke with MD about fluids order. Currently pt has two active orders for NS at 50 and NS at 100. Verbal order given to discontinue NS at 100.

## 2016-07-28 NOTE — Progress Notes (Signed)
[facility] PROGRESS NOTE  Jason [name] [id number] DOB: 1986-05-10 DOA: 07/24/2016 PCP: [name], MD  Interim summary and HPI 30 y.o. 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 Level V caveat applies as patient is unable to provide reliable history at this time. History provided by patient's mother who is his primary caretaker. Symptoms started around 1600 on 07/23/2016. Patient was essentially unresponsive for family when they tried to wake him up. When patient would awake he endorsed being scared and would cry and acted confused with regards to his surroundings. Mother states that this happens to him from time to time typically when there is an underlying infectious process ongoing. Last Foley change 2 weeks ago. Per family there've been no recent focal complaints.   Assessment/Plan: Acute encephalopathy: likely from infectious etiology -Dx including HCAP, chronic sacral decubitus ulcers with osteomyelitis, UTI and bacteremia. No recent medication changes, no significant metabolic abnormalities or recent seizures. Chest x-ray concerning for right lower lobe pneumonia, UA grossly abnormal the patient is chronically cath, WBC 40 with left shift, afebrile currently. -Vancomycin and imipenem (given on admission) -blood cx's with positive proteus and strep species -following Id rec's will use cefepime now (see below) -following final culture data will treat for 2 weeks using cefepime -mentation is back to baseline. -Picc line placed   SIRS/HCAP/bacteremia: Bacterial versus viral etiology. Trach dependent. Of note patient was  supposed to be currently on doxycycline for tracheitis - but per mother report, medication inconsistently given as [place] services stop treatment after 7 days; no communication with physician obtained prior to medication discontinuation  -D/C Doxy on admission  - Pneumonia order set used - imipenem and vanc given on admission; now following blood cx's and ID rec's will continue just with cefepime (planning to treat for 2 weeks total; started on 10/20); pre-medication to be given to decrease chance of reactions/side effects.  UTI: Suprapubic cath in place in order to keep chronic sacral decubitus ulcers clean and dry. Urinalysis chronically abnormal. Per report by family catheter is due for change every 3 weeks (due now). Bladder scan showed bladder with mild leakage of urine around the urostomy site - Antibiotics as above (initially imipenem); now switched to cefepime  - UCX demonstrating multiple species - Urostomy catheter exchange ordered on admission  - Urology consulted and Appreciate their input; plan is to try oxybutinin for spasm and decrease leakage   Sacral decubitus ulcers/osteomyelitis: Chronic. Wound VAC in place for several weeks. Family endorses seeing Dr. Marla Roe. I have called her office and spoken to support staff directly about patient he stated they have not seen this patient in the clinic in years. They have seen the patient in the hospital from time to time. It appears that patient was scheduled to start with a new wound care team at [place], Dr. Norberto Sorenson, [place] [phone]). MRI under anesthesia performed on 07/05/2016 showing "Mild marrow edema in the right posterior acetabulum with enhancement which may be reactive versus secondary to mild osteomyelitis." -Dr Marla Roe to see pt in consultation; will follow any further rec's (still waiting) -will continue antibiotics as mentioned above  -Consideration to repeat MRI, will defer to ID/plastic surgery if needed.  -Air  overlay mattress ordered and in use -Continue  wound care, to include collagenase ointment Monday Wednesday Friday and wound VAC  Anemia: Initial CBC showing hemoglobin of 6.6. Repeat labs showing 8.2. Patient's baseline around 9. No reported GI loss. Suspect combination of lab error and ongoing chronic disease and chronic oozing from sacral/decubitus ulcers.  -1 unit PRBC ordered by EDP - Follow-up CBC to assess Hgb trend  -Hgb 7.9 (on 10/20)  Functional quadriplegia: Secondary to C6 ischemic injury several years ago. -continue supportive care -PT/OT for mobility support while inpatient; basically bed bound   Hyponatremia: 129. Chronic. -corrected after Normal saline IVF infusion -will follow electrolytes trend intermittently  -last Na 133  Adrenal insufficiency: No evidence of being in crisis. -Continue hydrocortisone and midodrine at current dose  Hypotension: Likely secondary to chronic adrenal insufficiency -Continue Midodrine and hydrocortisone  -cortisol level was 19 -IVF support as needed  Diabetes: Type 1. - Continue Lantus and SSI -hypoglycemic regimen adjusted -has been experiencing elevated CBG's due to tube feedings, steroid use and improve appetite CBG's has been running high   Nutrition: Per report by mother/caregiver patient recently began eating full meals, requiring less PEG tube utilization.  -Nutritional service consulted for PEG tube nutrition  -will continue twelve hours a day of peg tube; continue free water with TF - Allow oral intake as tolerated with close supervision to prevent aspiration   Chronic pain: Secondary to chronic wounds and muscular skeletal pain from contractures secondary to paralysis. - Continue fentanyl patch, oxycodone, Baclofen, Lyrica, Voltaren gel -will continue home medication regimen and if possible avoid extra doses of opiates  GERD: - Continue the PPI  Depression/Anxiety: -Continue Ativan, Wellbutrin -no SI or  hallucinations appreciated on today's exam  Chronic constipation: -Continue bisacodyl  Code Status: Full Family Communication: mother at bedside  Disposition Plan: will remain inpatient; abx's switched to cefepime as per Id rec's; tracheostomy has been exchanged now and patient overall stable. Will organized for IV therapy as an outpatient. WBC's trending up (but probably due to steroids premedication); hopefully home in 1-2 days with [facility] services..   Consultants:  ID  Urology   Procedures:  See below for x-ray reports   Antibiotics:  Vancomycin 10/17>>>10/17  Rocephin 10/17>> one dose given  Imipenem 10/17 one dose given and resumed again on 10/18>>10/20  Cefepime 10/20 (looking to treat for 2 weeks)  HPI/Subjective: Afebrile, doing better overall and in no acute distress. Oriented X 3.   Objective: Vitals:   07/28/16 0440 07/28/16 0520  BP: 137/83   Pulse: 88 78  Resp: 18 16  Temp:      Intake/Output Summary (Last 24 hours) at 07/28/16 1132 Last data filed at 07/28/16 0530  Gross per 24 hour  Intake             1310 ml  Output              550 ml  Net              760 ml   Filed Weights   07/24/16 0109 07/24/16 2100 07/26/16 1935  Weight: 59 kg (130 lb) 54.9 kg (121 lb 0.5 oz) 61.2 kg (135 lb)    Exam:   General:  Afebrile, in no major distress; able to follow commands and has remained oriented X3; continue to expressed pain all over (but is not exactly new for him). According to mother back to his baseline.   Cardiovascular: no rubs, no gallops, S1 and S2  Respiratory: scattered rhonchi, no crackles   Abdomen:  soft, NT, ND, positive BS; peg tube and suprapubic catheter in place.  Musculoskeletal: no edema or cyanosis   Skin: multiple wounds (affecting thigh left, pressure ulcer stage 4 sacral area and ischium, bilateral toes, calcaneous)  Data Reviewed: Basic Metabolic Panel:  Recent Labs Lab 07/24/16 0116 07/24/16 0209 07/25/16 1240  07/26/16 0612  NA 127* 129* 133* 133*  K 4.4 4.5 4.2 5.1  CL 100* 100* 108 108  CO2 17*  --  19* 20*  GLUCOSE 160* 157* 116* 221*  BUN 28* 30* 22* 24*  CREATININE 1.07 1.00 0.64 0.61  CALCIUM 8.4*  --  8.4* 7.8*   Liver Function Tests:  Recent Labs Lab 07/24/16 0116  AST 17  ALT 16*  ALKPHOS 173*  BILITOT 0.7  PROT 6.7  ALBUMIN 1.7*    Recent Labs Lab 07/24/16 0116  AMMONIA 25   CBC:  Recent Labs Lab 07/24/16 0116 07/24/16 0209 07/25/16 1240 07/26/16 0612 07/27/16 0742 07/28/16 0623  WBC 40.0*  --  33.0* 27.9* 27.5* 31.2*  NEUTROABS 35.6*  --  28.7*  --   --   --   HGB 6.6* 8.2* 7.9* 7.7* 8.0* 7.9*  HCT 21.5* 24.0* 24.7* 25.1* 25.9* 25.4*  MCV 81.1  --  80.7 81.8 83.3 82.2  PLT 887*  --  847* 595* 907* 766*   CBG:  Recent Labs Lab 07/27/16 0826 07/27/16 1239 07/27/16 1642 07/27/16 2209 07/28/16 0751  GLUCAP 506* 505* 369* 170* 232*    Recent Results (from the past 240 hour(s))  Urine culture     Status: Abnormal   Collection Time: 07/24/16  1:35 AM  Result Value Ref Range Status   Specimen Description URINE, CLEAN CATCH  Final   Special Requests NONE  Final   Culture MULTIPLE SPECIES PRESENT, SUGGEST RECOLLECTION (A)  Final   Report Status 07/26/2016 FINAL  Final  Blood culture (routine x 2)     Status: Abnormal   Collection Time: 07/24/16  2:00 AM  Result Value Ref Range Status   Specimen Description BLOOD RIGHT HAND  Final   Special Requests IN PEDIATRIC BOTTLE 4ML  Final   Culture  Setup Time   Final    AEROBIC BOTTLE ONLY GRAM POSITIVE COCCI IN PAIRS CRITICAL RESULT CALLED TO, READ BACK BY AND VERIFIED WITH: A. MAYER, [place] ON 07/24/16 BY C. JESSUP, MLT. GRAM NEGATIVE RODS CRITICAL RESULT CALLED TO, READ BACK BY AND VERIFIED WITH: Karlene Einstein PHARMD, AT 1159 07/25/16    Culture (A)  Final    GROUP B STREP(S.AGALACTIAE)ISOLATED PROTEUS MIRABILIS    Report Status 07/26/2016 FINAL  Final   Organism ID, Bacteria GROUP B  STREP(S.AGALACTIAE)ISOLATED  Final   Organism ID, Bacteria PROTEUS MIRABILIS  Final      Susceptibility   Group b strep(s.agalactiae)isolated - MIC*    CLINDAMYCIN <=0.25 SENSITIVE Sensitive     AMPICILLIN <=0.25 SENSITIVE Sensitive     ERYTHROMYCIN 4 RESISTANT Resistant     VANCOMYCIN 0.5 SENSITIVE Sensitive     CEFTRIAXONE <=0.12 SENSITIVE Sensitive     LEVOFLOXACIN 0.5 SENSITIVE Sensitive     * GROUP B STREP(S.AGALACTIAE)ISOLATED   Proteus mirabilis - MIC*    AMPICILLIN <=2 SENSITIVE Sensitive     CEFAZOLIN 8 SENSITIVE Sensitive     CEFEPIME <=1 SENSITIVE Sensitive     CEFTAZIDIME <=1 SENSITIVE Sensitive     CEFTRIAXONE <=1 SENSITIVE Sensitive     CIPROFLOXACIN <=0.25 SENSITIVE Sensitive     GENTAMICIN <=1 SENSITIVE Sensitive  IMIPENEM 2 SENSITIVE Sensitive     TRIMETH/SULFA <=20 SENSITIVE Sensitive     AMPICILLIN/SULBACTAM <=2 SENSITIVE Sensitive     PIP/TAZO <=4 SENSITIVE Sensitive     * PROTEUS MIRABILIS  Blood Culture ID Panel (Reflexed)     Status: Abnormal   Collection Time: 07/24/16  2:00 AM  Result Value Ref Range Status   Enterococcus species NOT DETECTED NOT DETECTED Final   Listeria monocytogenes NOT DETECTED NOT DETECTED Final   Staphylococcus species NOT DETECTED NOT DETECTED Final   Staphylococcus aureus NOT DETECTED NOT DETECTED Final   Streptococcus species DETECTED (A) NOT DETECTED Final    Comment: CRITICAL RESULT CALLED TO, READ BACK BY AND VERIFIED WITH: A. MAYER, [place] ON 07/24/16 BY C. JESSUP, MLT.    Streptococcus agalactiae DETECTED (A) NOT DETECTED Final    Comment: CRITICAL RESULT CALLED TO, READ BACK BY AND VERIFIED WITH: A. MAYER, [place] ON 07/24/16 BY C. JESSUP, MLT.    Streptococcus pneumoniae NOT DETECTED NOT DETECTED Final   Streptococcus pyogenes NOT DETECTED NOT DETECTED Final   Acinetobacter baumannii NOT DETECTED NOT DETECTED Final   Enterobacteriaceae species NOT DETECTED NOT DETECTED Final   Enterobacter cloacae  complex NOT DETECTED NOT DETECTED Final   Escherichia coli NOT DETECTED NOT DETECTED Final   Klebsiella oxytoca NOT DETECTED NOT DETECTED Final   Klebsiella pneumoniae NOT DETECTED NOT DETECTED Final   Proteus species NOT DETECTED NOT DETECTED Final   Serratia marcescens NOT DETECTED NOT DETECTED Final   Haemophilus influenzae NOT DETECTED NOT DETECTED Final   Neisseria meningitidis NOT DETECTED NOT DETECTED Final   Pseudomonas aeruginosa NOT DETECTED NOT DETECTED Final   Candida albicans NOT DETECTED NOT DETECTED Final   Candida glabrata NOT DETECTED NOT DETECTED Final   Candida krusei NOT DETECTED NOT DETECTED Final   Candida parapsilosis NOT DETECTED NOT DETECTED Final   Candida tropicalis NOT DETECTED NOT DETECTED Final  Blood culture (routine x 2)     Status: Abnormal   Collection Time: 07/24/16  4:05 AM  Result Value Ref Range Status   Specimen Description BLOOD RIGHT FOREARM  Final   Special Requests AEROBIC BOTTLE ONLY 5ML  Final   Culture  Setup Time   Final    AEROBIC BOTTLE ONLY GRAM POSITIVE COCCI IN PAIRS CRITICAL VALUE NOTED.  VALUE IS CONSISTENT WITH PREVIOUSLY REPORTED AND CALLED VALUE. GRAM NEGATIVE RODS    Culture (A)  Final    GROUP B STREP(S.AGALACTIAE)ISOLATED PROTEUS MIRABILIS SUSCEPTIBILITIES PERFORMED ON PREVIOUS CULTURE WITHIN THE LAST 5 DAYS.    Report Status 07/26/2016 FINAL  Final  Culture, respiratory (NON-Expectorated)     Status: None   Collection Time: 07/24/16 12:25 PM  Result Value Ref Range Status   Specimen Description TRACHEAL ASPIRATE  Final   Special Requests Normal  Final   Gram Stain   Final    MODERATE WBC PRESENT, PREDOMINANTLY PMN FEW GRAM POSITIVE COCCI IN PAIRS IN CLUSTERS RARE GRAM NEGATIVE COCCOBACILLI RARE GRAM NEGATIVE RODS RARE GRAM POSITIVE RODS    Culture MULTIPLE ORGANISMS PRESENT, NONE PREDOMINANT  Final   Report Status 07/26/2016 FINAL  Final  MRSA PCR Screening     Status: Abnormal   Collection Time: 07/25/16  6:38  PM  Result Value Ref Range Status   MRSA by PCR POSITIVE (A) NEGATIVE Final    Comment:        The GeneXpert MRSA Assay (FDA approved for NASAL specimens only), is one component of a comprehensive MRSA  colonization surveillance program. It is not intended to diagnose MRSA infection nor to guide or monitor treatment for MRSA infections. CRITICAL RESULT CALLED TO, READ BACK BY AND VERIFIED WITH: TJasper Riling, RN AT 2150 ON 07/25/16 BY C. JESSUP, MLT.   Culture, blood (routine x 2)     Status: None (Preliminary result)   Collection Time: 07/26/16  6:15 AM  Result Value Ref Range Status   Specimen Description BLOOD LEFT ANTECUBITAL  Final   Special Requests IN PEDIATRIC BOTTLE  4CC  Final   Culture NO GROWTH 1 DAY  Final   Report Status PENDING  Incomplete  Culture, blood (routine x 2)     Status: None (Preliminary result)   Collection Time: 07/26/16  6:23 AM  Result Value Ref Range Status   Specimen Description BLOOD LEFT ANTECUBITAL  Final   Special Requests IN PEDIATRIC BOTTLE  3CC  Final   Culture NO GROWTH 1 DAY  Final   Report Status PENDING  Incomplete     Studies: No results found.  Scheduled Meds: . albuterol  2.5 mg Nebulization BID  . baclofen  20 mg Oral QID  . buPROPion  150 mg Oral Daily  . ceFEPime (MAXIPIME) IV  2 g Intravenous Q12H  . Chlorhexidine Gluconate Cloth  6 each Topical Q0600  . docusate  100 mg Oral Daily  . famotidine (PEPCID) IV  20 mg Intravenous Q12H  . feeding supplement (GLUCERNA 1.2 CAL)  1,000 mL Per Tube Daily  . fentaNYL  75 mcg Transdermal Q72H  . free water  300-400 mL Per Tube Q8H  . heparin  5,000 Units Subcutaneous Q8H  . hydrocortisone  10 mg Oral Q24H  . hydrocortisone  20 mg Oral q morning - 10a  . insulin aspart  0-15 Units Subcutaneous TID WC  . insulin aspart  0-5 Units Subcutaneous QHS  . insulin glargine  22 Units Subcutaneous QHS  . magnesium oxide  400 mg Oral BID  . methylPREDNISolone (SOLU-MEDROL) injection  20 mg  Intravenous Q12H  . mupirocin ointment  1 application Nasal BID  . oxybutynin  5 mg Per Tube BID  . pregabalin  100 mg Oral TID  . sodium chloride flush  10-40 mL Intracatheter Q12H  . sodium chloride flush  3 mL Intravenous Q12H   Continuous Infusions: . 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 If 7PM-7AM, please contact night-coverage at [email or web address], password [facility] 07/28/2016, 11:32 AM  LOS: 4 days

## 2016-07-28 NOTE — Progress Notes (Signed)
MD on call paged about continuous feeding. Next feeding is scheduled for 1000 and notes indicate feeding should start at midnight and continue until noon. RN instructed to administer feeding as scheduled at 1000.  Free water scheduled every 8 hours, MD clarify order to administer free water only during continuous feedin. Patient taking PO fluids well.

## 2016-07-29 DIAGNOSIS — M797 Fibromyalgia: Secondary | ICD-10-CM

## 2016-07-29 DIAGNOSIS — E1051 Type 1 diabetes mellitus with diabetic peripheral angiopathy without gangrene: Secondary | ICD-10-CM

## 2016-07-29 LAB — CBC
HEMATOCRIT: 25.1 % — AB (ref 39.0–52.0)
Hemoglobin: 7.7 g/dL — ABNORMAL LOW (ref 13.0–17.0)
MCH: 25.6 pg — ABNORMAL LOW (ref 26.0–34.0)
MCHC: 30.7 g/dL (ref 30.0–36.0)
MCV: 83.4 fL (ref 78.0–100.0)
PLATELETS: 756 10*3/uL — AB (ref 150–400)
RBC: 3.01 MIL/uL — ABNORMAL LOW (ref 4.22–5.81)
RDW: 16.1 % — AB (ref 11.5–15.5)
WBC: 29.3 10*3/uL — AB (ref 4.0–10.5)

## 2016-07-29 LAB — GLUCOSE, CAPILLARY
GLUCOSE-CAPILLARY: 149 mg/dL — AB (ref 65–99)
GLUCOSE-CAPILLARY: 147 mg/dL — AB (ref 65–99)
Glucose-Capillary: 152 mg/dL — ABNORMAL HIGH (ref 65–99)
Glucose-Capillary: 153 mg/dL — ABNORMAL HIGH (ref 65–99)

## 2016-07-29 MED ORDER — GI COCKTAIL ~~LOC~~
30.0000 mL | Freq: Once | ORAL | Status: AC
  Administered 2016-07-29: 30 mL
  Filled 2016-07-29: qty 30

## 2016-07-29 NOTE — Progress Notes (Signed)
[facility] PROGRESS NOTE  JAQUALYN HERDT [id number] DOB: [birth year] DOA: 07/24/2016 PCP: [name], MD  Interim summary and HPI 30 y.o. 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 Level V caveat applies as patient is unable to provide reliable history at this time. History provided by patient's mother who is his primary caretaker. Symptoms started around 1600 on 07/23/2016. Patient was essentially unresponsive for family when they tried to wake him up. When patient would awake he endorsed being scared and would cry and acted confused with regards to his surroundings. Mother states that this happens to him from time to time typically when there is an underlying infectious process ongoing. Last Foley change 2 weeks ago. Per family there've been no recent focal complaints.   Assessment/Plan: Acute encephalopathy: likely from infectious etiology -Dx including HCAP, chronic sacral decubitus ulcers with osteomyelitis, UTI and bacteremia. No recent medication changes, no significant metabolic abnormalities or recent seizures. Chest x-ray concerning for right lower lobe pneumonia, UA grossly abnormal the patient is chronically cath, WBC 40 with left shift, afebrile currently. -Vancomycin and imipenem (given on admission) -blood cx's with positive proteus and strep species -following Id rec's will use cefepime now (see below) -following final culture data will treat for 2 weeks using cefepime -mentation is back to baseline. -Picc line placed now  SIRS/HCAP/bacteremia: Bacterial versus viral etiology. Trach dependent. Of note patient was  supposed to be currently on doxycycline for tracheitis - but per mother report, medication inconsistently given as [facility] services stop treatment after 7 days; no communication with physician obtained prior to medication discontinuation  -D/C Doxy on admission  - Pneumonia order set used - imipenem and vanc given on admission; now following blood cx's and ID rec's will continue just with cefepime (planning to treat for 2 weeks total; started on 10/20); pre-medication to be given to decrease chance of reactions/side effects. -no fever and WBC's trending down (slight confounding as he is getting steroids for antibiotic premedication)  UTI: Suprapubic cath in place in order to keep chronic sacral decubitus ulcers clean and dry. Urinalysis chronically abnormal. Per report by family catheter is due for change every 3 weeks (due now). Bladder scan showed bladder with mild leakage of urine around the urostomy site - Antibiotics as above (initially imipenem); now switched to cefepime  - UCX demonstrating multiple species - Urostomy catheter exchange ordered on admission  - Urology consulted and Appreciate their input; plan is to try oxybutinin for spasm and decrease leakage   Sacral decubitus ulcers/osteomyelitis: Chronic. Wound VAC in place for several weeks. Family endorses seeing Dr. Marla Roe. I have called her office and spoken to support staff directly about patient he stated they have not seen this patient in the clinic in years. They have seen the patient in the hospital from time to time. It appears that patient was scheduled to start with a new wound care team at [place], Dr. Norberto Sorenson, [place] [phone]). MRI under anesthesia performed on 07/05/2016 showing "Mild marrow edema in the right posterior acetabulum with enhancement which may be reactive versus secondary to mild osteomyelitis." -Dr Marla Roe received pictures taken by [facility] nurse; no new rec's. Patient to follow with plastic surgeon at  [facility] as schedule. -will continue antibiotics  as mentioned above  -Consideration to repeat MRI, will defer to ID/plastic surgery if needed.  -Air overlay mattress ordered and in use -Continue wound care, to include collagenase ointment Monday Wednesday Friday and wound VAC  Anemia: Initial CBC showing hemoglobin of 6.6. Repeat labs showing 8.2. Patient's baseline around 9. No reported GI loss. Suspect combination of lab error and ongoing chronic disease and chronic oozing from sacral/decubitus ulcers.  -1 unit PRBC ordered by EDP - Follow-up CBC to assess Hgb trend  -Hgb 7.9 (on 10/20)  Functional quadriplegia: Secondary to C6 ischemic injury several years ago. -continue supportive care -PT/OT for mobility support while inpatient; basically bed bound   Hyponatremia: 129. Chronic. -corrected after Normal saline IVF infusion -will follow electrolytes trend intermittently  -last Na 133  Adrenal insufficiency: No evidence of being in crisis. -Continue hydrocortisone and midodrine at current dose  Hypotension: Likely secondary to chronic adrenal insufficiency -Continue Midodrine and hydrocortisone  -cortisol level was 19 -IVF support as needed  Diabetes: Type 1. - Continue Lantus and SSI -hypoglycemic regimen adjusted -has been experiencing elevated CBG's due to tube feedings, steroid use and improve appetite CBG's has been running high   Nutrition: Per report by mother/caregiver patient recently began eating full meals, requiring less PEG tube utilization.  -Nutritional service consulted for PEG tube nutrition  -will continue twelve hours a day of peg tube; continue free water with TF - Allow oral intake as tolerated with close supervision to prevent aspiration   Chronic pain: Secondary to chronic wounds and muscular skeletal pain from contractures secondary to paralysis. - Continue fentanyl patch, oxycodone, Baclofen, Lyrica, Voltaren gel -will continue home  medication regimen and if possible avoid extra doses of opiates  GERD: - Continue the PPI  Depression/Anxiety: -Continue Ativan, Wellbutrin -no SI or hallucinations appreciated on today's exam  Chronic constipation: -Continue bisacodyl  Code Status: Full Family Communication: mother at bedside  Disposition Plan: will remain inpatient; abx's switched to cefepime as per Id rec's; tracheostomy has been exchanged now and patient overall stable. Will organized for IV therapy as an outpatient. WBC's trending up (but probably due to steroids premedication); hopefully home tomorrow with [facility] services..   Consultants:  ID  Urology   Procedures:  See below for x-ray reports   Antibiotics:  Vancomycin 10/17>>>10/17  Rocephin 10/17>> one dose given  Imipenem 10/17 one dose given and resumed again on 10/18>>10/20  Cefepime 10/20 (looking to treat for 2 weeks)  HPI/Subjective: Afebrile, doing better overall and in no acute distress. Oriented X 3.   Objective: Vitals:   07/29/16 1243 07/29/16 1620  BP:  133/78  Pulse: 92 92  Resp: 16 18  Temp:  98.9 F (37.2 C)    Intake/Output Summary (Last 24 hours) at 07/29/16 1728 Last data filed at 07/29/16 0957  Gross per 24 hour  Intake             1920 ml  Output             1050 ml  Net              870 ml   Filed Weights   07/24/16 0109 07/24/16 2100 07/26/16 1935  Weight: 59 kg (130 lb) 54.9 kg (121 lb 0.5 oz) 61.2 kg (135 lb)    Exam:   General:  Afebrile, in no major distress; able to follow commands and has remained oriented X3; continue to expressed pain all over (but is not exactly new for him). According to  mother back to his baseline and w/o complaints.  Cardiovascular: no rubs, no gallops, S1 and S2  Respiratory: scattered rhonchi, no crackles   Abdomen: soft, NT, ND, positive BS; peg tube and suprapubic catheter in place.  Musculoskeletal: no edema or cyanosis   Skin: multiple wounds (affecting thigh  left, pressure ulcer stage 4 sacral area and ischium, bilateral toes, calcaneous)  Data Reviewed: Basic Metabolic Panel:  Recent Labs Lab 07/24/16 0116 07/24/16 0209 07/25/16 1240 07/26/16 0612  NA 127* 129* 133* 133*  K 4.4 4.5 4.2 5.1  CL 100* 100* 108 108  CO2 17*  --  19* 20*  GLUCOSE 160* 157* 116* 221*  BUN 28* 30* 22* 24*  CREATININE 1.07 1.00 0.64 0.61  CALCIUM 8.4*  --  8.4* 7.8*   Liver Function Tests:  Recent Labs Lab 07/24/16 0116  AST 17  ALT 16*  ALKPHOS 173*  BILITOT 0.7  PROT 6.7  ALBUMIN 1.7*    Recent Labs Lab 07/24/16 0116  AMMONIA 25   CBC:  Recent Labs Lab 07/24/16 0116  07/25/16 1240 07/26/16 0612 07/27/16 0742 07/28/16 0623 07/29/16 0554  WBC 40.0*  --  33.0* 27.9* 27.5* 31.2* 29.3*  NEUTROABS 35.6*  --  28.7*  --   --   --   --   HGB 6.6*  < > 7.9* 7.7* 8.0* 7.9* 7.7*  HCT 21.5*  < > 24.7* 25.1* 25.9* 25.4* 25.1*  MCV 81.1  --  80.7 81.8 83.3 82.2 83.4  PLT 887*  --  847* 595* 907* 766* 756*  < > = values in this interval not displayed. CBG:  Recent Labs Lab 07/28/16 1148 07/28/16 1721 07/28/16 2231 07/29/16 0843 07/29/16 1301  GLUCAP 188* 247* 174* 152* 149*    Recent Results (from the past 240 hour(s))  Urine culture     Status: Abnormal   Collection Time: 07/24/16  1:35 AM  Result Value Ref Range Status   Specimen Description URINE, CLEAN CATCH  Final   Special Requests NONE  Final   Culture MULTIPLE SPECIES PRESENT, SUGGEST RECOLLECTION (A)  Final   Report Status 07/26/2016 FINAL  Final  Blood culture (routine x 2)     Status: Abnormal   Collection Time: 07/24/16  2:00 AM  Result Value Ref Range Status   Specimen Description BLOOD RIGHT HAND  Final   Special Requests IN PEDIATRIC BOTTLE 4ML  Final   Culture  Setup Time   Final    AEROBIC BOTTLE ONLY GRAM POSITIVE COCCI IN PAIRS CRITICAL RESULT CALLED TO, READ BACK BY AND VERIFIED WITH: A. MAYER, [place] ON 07/24/16 BY C. JESSUP, MLT. GRAM NEGATIVE  RODS CRITICAL RESULT CALLED TO, READ BACK BY AND VERIFIED WITH: Karlene Einstein PHARMD, AT 1159 07/25/16    Culture (A)  Final    GROUP B STREP(S.AGALACTIAE)ISOLATED PROTEUS MIRABILIS    Report Status 07/26/2016 FINAL  Final   Organism ID, Bacteria GROUP B STREP(S.AGALACTIAE)ISOLATED  Final   Organism ID, Bacteria PROTEUS MIRABILIS  Final      Susceptibility   Group b strep(s.agalactiae)isolated - MIC*    CLINDAMYCIN <=0.25 SENSITIVE Sensitive     AMPICILLIN <=0.25 SENSITIVE Sensitive     ERYTHROMYCIN 4 RESISTANT Resistant     VANCOMYCIN 0.5 SENSITIVE Sensitive     CEFTRIAXONE <=0.12 SENSITIVE Sensitive     LEVOFLOXACIN 0.5 SENSITIVE Sensitive     * GROUP B STREP(S.AGALACTIAE)ISOLATED   Proteus mirabilis - MIC*    AMPICILLIN <=2 SENSITIVE Sensitive  CEFAZOLIN 8 SENSITIVE Sensitive     CEFEPIME <=1 SENSITIVE Sensitive     CEFTAZIDIME <=1 SENSITIVE Sensitive     CEFTRIAXONE <=1 SENSITIVE Sensitive     CIPROFLOXACIN <=0.25 SENSITIVE Sensitive     GENTAMICIN <=1 SENSITIVE Sensitive     IMIPENEM 2 SENSITIVE Sensitive     TRIMETH/SULFA <=20 SENSITIVE Sensitive     AMPICILLIN/SULBACTAM <=2 SENSITIVE Sensitive     PIP/TAZO <=4 SENSITIVE Sensitive     * PROTEUS MIRABILIS  Blood Culture ID Panel (Reflexed)     Status: Abnormal   Collection Time: 07/24/16  2:00 AM  Result Value Ref Range Status   Enterococcus species NOT DETECTED NOT DETECTED Final   Listeria monocytogenes NOT DETECTED NOT DETECTED Final   Staphylococcus species NOT DETECTED NOT DETECTED Final   Staphylococcus aureus NOT DETECTED NOT DETECTED Final   Streptococcus species DETECTED (A) NOT DETECTED Final    Comment: CRITICAL RESULT CALLED TO, READ BACK BY AND VERIFIED WITH: A. MAYER, [place] ON 07/24/16 BY C. JESSUP, MLT.    Streptococcus agalactiae DETECTED (A) NOT DETECTED Final    Comment: CRITICAL RESULT CALLED TO, READ BACK BY AND VERIFIED WITH: A. MAYER, [place] ON 07/24/16 BY C. JESSUP, MLT.     Streptococcus pneumoniae NOT DETECTED NOT DETECTED Final   Streptococcus pyogenes NOT DETECTED NOT DETECTED Final   Acinetobacter baumannii NOT DETECTED NOT DETECTED Final   Enterobacteriaceae species NOT DETECTED NOT DETECTED Final   Enterobacter cloacae complex NOT DETECTED NOT DETECTED Final   Escherichia coli NOT DETECTED NOT DETECTED Final   Klebsiella oxytoca NOT DETECTED NOT DETECTED Final   Klebsiella pneumoniae NOT DETECTED NOT DETECTED Final   Proteus species NOT DETECTED NOT DETECTED Final   Serratia marcescens NOT DETECTED NOT DETECTED Final   Haemophilus influenzae NOT DETECTED NOT DETECTED Final   Neisseria meningitidis NOT DETECTED NOT DETECTED Final   Pseudomonas aeruginosa NOT DETECTED NOT DETECTED Final   Candida albicans NOT DETECTED NOT DETECTED Final   Candida glabrata NOT DETECTED NOT DETECTED Final   Candida krusei NOT DETECTED NOT DETECTED Final   Candida parapsilosis NOT DETECTED NOT DETECTED Final   Candida tropicalis NOT DETECTED NOT DETECTED Final  Blood culture (routine x 2)     Status: Abnormal   Collection Time: 07/24/16  4:05 AM  Result Value Ref Range Status   Specimen Description BLOOD RIGHT FOREARM  Final   Special Requests AEROBIC BOTTLE ONLY 5ML  Final   Culture  Setup Time   Final    AEROBIC BOTTLE ONLY GRAM POSITIVE COCCI IN PAIRS CRITICAL VALUE NOTED.  VALUE IS CONSISTENT WITH PREVIOUSLY REPORTED AND CALLED VALUE. GRAM NEGATIVE RODS    Culture (A)  Final    GROUP B STREP(S.AGALACTIAE)ISOLATED PROTEUS MIRABILIS SUSCEPTIBILITIES PERFORMED ON PREVIOUS CULTURE WITHIN THE LAST 5 DAYS.    Report Status 07/26/2016 FINAL  Final  Culture, respiratory (NON-Expectorated)     Status: None   Collection Time: 07/24/16 12:25 PM  Result Value Ref Range Status   Specimen Description TRACHEAL ASPIRATE  Final   Special Requests Normal  Final   Gram Stain   Final    MODERATE WBC PRESENT, PREDOMINANTLY PMN FEW GRAM POSITIVE COCCI IN PAIRS IN  CLUSTERS RARE GRAM NEGATIVE COCCOBACILLI RARE GRAM NEGATIVE RODS RARE GRAM POSITIVE RODS    Culture MULTIPLE ORGANISMS PRESENT, NONE PREDOMINANT  Final   Report Status 07/26/2016 FINAL  Final  MRSA PCR Screening     Status: Abnormal   Collection  Time: 07/25/16  6:38 PM  Result Value Ref Range Status   MRSA by PCR POSITIVE (A) NEGATIVE Final    Comment:        The GeneXpert MRSA Assay (FDA approved for NASAL specimens only), is one component of a comprehensive MRSA colonization surveillance program. It is not intended to diagnose MRSA infection nor to guide or monitor treatment for MRSA infections. CRITICAL RESULT CALLED TO, READ BACK BY AND VERIFIED WITH: TJasper Riling, RN AT 2150 ON 07/25/16 BY C. JESSUP, MLT.   Culture, blood (routine x 2)     Status: None (Preliminary result)   Collection Time: 07/26/16  6:15 AM  Result Value Ref Range Status   Specimen Description BLOOD LEFT ANTECUBITAL  Final   Special Requests IN PEDIATRIC BOTTLE  4CC  Final   Culture NO GROWTH 3 DAYS  Final   Report Status PENDING  Incomplete  Culture, blood (routine x 2)     Status: None (Preliminary result)   Collection Time: 07/26/16  6:23 AM  Result Value Ref Range Status   Specimen Description BLOOD LEFT ANTECUBITAL  Final   Special Requests IN PEDIATRIC BOTTLE  3CC  Final   Culture NO GROWTH 3 DAYS  Final   Report Status PENDING  Incomplete     Studies: No results found.  Scheduled Meds: . albuterol  2.5 mg Nebulization BID  . baclofen  20 mg Oral QID  . buPROPion  150 mg Oral Daily  . ceFEPime (MAXIPIME) IV  2 g Intravenous Q12H  . Chlorhexidine Gluconate Cloth  6 each Topical Q0600  . docusate  100 mg Oral Daily  . famotidine (PEPCID) IV  20 mg Intravenous Q12H  . feeding supplement (GLUCERNA 1.2 CAL)  1,000 mL Per Tube Daily  . fentaNYL  75 mcg Transdermal Q72H  . free water  300-400 mL Per Tube Q8H  . heparin  5,000 Units Subcutaneous Q8H  . hydrocortisone  10 mg Oral Q24H  .  hydrocortisone  20 mg Oral q morning - 10a  . insulin aspart  0-15 Units Subcutaneous TID WC  . insulin aspart  0-5 Units Subcutaneous QHS  . insulin glargine  22 Units Subcutaneous QHS  . magnesium oxide  400 mg Oral BID  . methylPREDNISolone (SOLU-MEDROL) injection  20 mg Intravenous Q12H  . mupirocin ointment  1 application Nasal BID  . oxybutynin  5 mg Per Tube BID  . pregabalin  100 mg Oral TID  . sodium chloride flush  10-40 mL Intracatheter Q12H  . sodium chloride flush  3 mL Intravenous Q12H   Continuous Infusions: . 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 If 7PM-7AM, please contact night-coverage at [email or web address], password [facility] 07/29/2016, 5:28 PM  LOS: 5 days

## 2016-07-29 NOTE — Progress Notes (Signed)
Cont. pulse ox missing. RN notified.

## 2016-07-29 NOTE — Progress Notes (Signed)
Patient on continuous pulse ox.  Mother is concerned re:  Increased amt of drainage around the PEG tube site.  Drainage appears to be semi-digested TF.  Dressing changed.

## 2016-07-29 NOTE — Progress Notes (Signed)
Paged Government social research officer. NP due to pt requesting medication for heartburn at 2025. Paged NP again.  NP put in order for GI cocktail at 2128. Will continue to monitor pt. Ranelle Oyster, RN

## 2016-07-30 DIAGNOSIS — K9423 Gastrostomy malfunction: Secondary | ICD-10-CM

## 2016-07-30 DIAGNOSIS — B9620 Unspecified Escherichia coli [E. coli] as the cause of diseases classified elsewhere: Secondary | ICD-10-CM

## 2016-07-30 DIAGNOSIS — K3184 Gastroparesis: Secondary | ICD-10-CM

## 2016-07-30 DIAGNOSIS — Z95828 Presence of other vascular implants and grafts: Secondary | ICD-10-CM

## 2016-07-30 DIAGNOSIS — G8220 Paraplegia, unspecified: Secondary | ICD-10-CM

## 2016-07-30 DIAGNOSIS — B951 Streptococcus, group B, as the cause of diseases classified elsewhere: Secondary | ICD-10-CM

## 2016-07-30 DIAGNOSIS — B964 Proteus (mirabilis) (morganii) as the cause of diseases classified elsewhere: Secondary | ICD-10-CM

## 2016-07-30 DIAGNOSIS — Z930 Tracheostomy status: Secondary | ICD-10-CM

## 2016-07-30 DIAGNOSIS — J9611 Chronic respiratory failure with hypoxia: Secondary | ICD-10-CM

## 2016-07-30 DIAGNOSIS — E1043 Type 1 diabetes mellitus with diabetic autonomic (poly)neuropathy: Secondary | ICD-10-CM

## 2016-07-30 DIAGNOSIS — D72829 Elevated white blood cell count, unspecified: Secondary | ICD-10-CM

## 2016-07-30 DIAGNOSIS — N390 Urinary tract infection, site not specified: Secondary | ICD-10-CM

## 2016-07-30 DIAGNOSIS — Z87440 Personal history of urinary (tract) infections: Secondary | ICD-10-CM

## 2016-07-30 LAB — BLOOD CULTURE ID PANEL (REFLEXED)
ACINETOBACTER BAUMANNII: NOT DETECTED
CANDIDA ALBICANS: NOT DETECTED
CANDIDA GLABRATA: NOT DETECTED
CANDIDA TROPICALIS: NOT DETECTED
Candida krusei: NOT DETECTED
Candida parapsilosis: NOT DETECTED
Carbapenem resistance: NOT DETECTED
ENTEROBACTER CLOACAE COMPLEX: NOT DETECTED
ESCHERICHIA COLI: DETECTED — AB
Enterobacteriaceae species: DETECTED — AB
Enterococcus species: NOT DETECTED
Haemophilus influenzae: NOT DETECTED
Klebsiella oxytoca: NOT DETECTED
Klebsiella pneumoniae: NOT DETECTED
LISTERIA MONOCYTOGENES: NOT DETECTED
NEISSERIA MENINGITIDIS: NOT DETECTED
PROTEUS SPECIES: NOT DETECTED
Pseudomonas aeruginosa: NOT DETECTED
STREPTOCOCCUS AGALACTIAE: NOT DETECTED
STREPTOCOCCUS PNEUMONIAE: NOT DETECTED
Serratia marcescens: NOT DETECTED
Staphylococcus aureus (BCID): NOT DETECTED
Staphylococcus species: NOT DETECTED
Streptococcus pyogenes: NOT DETECTED
Streptococcus species: NOT DETECTED

## 2016-07-30 LAB — GLUCOSE, CAPILLARY
GLUCOSE-CAPILLARY: 117 mg/dL — AB (ref 65–99)
Glucose-Capillary: 114 mg/dL — ABNORMAL HIGH (ref 65–99)
Glucose-Capillary: 140 mg/dL — ABNORMAL HIGH (ref 65–99)
Glucose-Capillary: 90 mg/dL (ref 65–99)

## 2016-07-30 LAB — CBC
HCT: 23.9 % — ABNORMAL LOW (ref 39.0–52.0)
HEMOGLOBIN: 7.4 g/dL — AB (ref 13.0–17.0)
MCH: 26.1 pg (ref 26.0–34.0)
MCHC: 31 g/dL (ref 30.0–36.0)
MCV: 84.5 fL (ref 78.0–100.0)
PLATELETS: 656 10*3/uL — AB (ref 150–400)
RBC: 2.83 MIL/uL — AB (ref 4.22–5.81)
RDW: 16.5 % — ABNORMAL HIGH (ref 11.5–15.5)
WBC: 35.1 10*3/uL — AB (ref 4.0–10.5)

## 2016-07-30 MED ORDER — FAMOTIDINE 20 MG PO TABS: 20.0000 mg | ORAL_TABLET | Freq: Two times a day (BID) | ORAL | Status: DC

## 2016-07-30 MED ORDER — FAMOTIDINE IN NACL 20-0.9 MG/50ML-% IV SOLN
20.0000 mg | Freq: Two times a day (BID) | INTRAVENOUS | Status: DC
  Administered 2016-07-30 – 2016-08-01 (×4): 20 mg via INTRAVENOUS
  Filled 2016-07-30 (×5): qty 50

## 2016-07-30 MED ORDER — METHYLPREDNISOLONE SODIUM SUCC 40 MG IJ SOLR
20.0000 mg | Freq: Two times a day (BID) | INTRAMUSCULAR | Status: DC
  Administered 2016-07-30 – 2016-08-01 (×4): 20 mg via INTRAVENOUS
  Filled 2016-07-30 (×4): qty 1

## 2016-07-30 NOTE — Progress Notes (Signed)
PHARMACY - PHYSICIAN COMMUNICATION CRITICAL VALUE ALERT - BLOOD CULTURE IDENTIFICATION (BCID)    10/19 BCID: Ecoli in 1/2 Plan: No change in antibioitics Continue cefepime 2g/12h Await sensitivities    Hughes Better, PharmD, BCPS Clinical Pharmacist 07/30/2016 8:20 AM

## 2016-07-30 NOTE — Care Management Note (Signed)
Case Management Note  Patient Details  Name: Jason [name] MRN: [id number] Date of Birth: [birth year]  Subjective/Objective:                    Action/Plan: Plan is to d/c to home today with home health services(IV home infusion, wound vac). [facility]/Transitional Team Coordinator  [phone]) provides pt with 16hrs / day total care.  Expected Discharge Date:    07/30/2016            Expected Discharge Plan:  [place]  In-House Referral:  NA  Discharge planning Services  CM Consult  Post Acute Care Choice:  Home Health, Resumption of Svcs/PTA Provider Choice offered to:  Parent, Patient  DME Arranged:  IV pump/equipment/medication DME Agency:  [facility] [employer]Pam [phone]  HH Arranged:  RN, Nurse's Aide HH Agency:  [facility]  Status of Service:  In process, will continue to follow  If discussed at Long Length of Stay Meetings, dates discussed:    Additional Comments:  Sharin Mons, RN 07/30/2016, 11:08 AM

## 2016-07-30 NOTE — Progress Notes (Signed)
[facility] PROGRESS NOTE  Jason [name] [id number] DOB: [birth year] DOA: 07/24/2016 PCP: [name], MD  Interim summary and HPI 30 y.o. 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 Level V caveat applies as patient is unable to provide reliable history at this time. History provided by patient's mother who is his primary caretaker. Symptoms started around 1600 on 07/23/2016. Patient was essentially unresponsive for family when they tried to wake him up. When patient would awake he endorsed being scared and would cry and acted confused with regards to his surroundings. Mother states that this happens to him from time to time typically when there is an underlying infectious process ongoing. Last Foley change 2 weeks ago. Per family there've been no recent focal complaints.   Assessment/Plan: Acute encephalopathy: likely from infectious etiology -Dx including HCAP, chronic sacral decubitus ulcers with osteomyelitis, UTI and bacteremia. No recent medication changes, no significant metabolic abnormalities or recent seizures. Chest x-ray concerning for right lower lobe pneumonia, UA grossly abnormal the patient is chronically cath, WBC 40 with left shift, afebrile currently. -Vancomycin and imipenem (given on admission) -blood cx's with positive proteus, 1/2 E. Coli and strep species -following Id rec's will use cefepime now (see below) -following final culture data will treat for 2 weeks using cefepime -mentation is back to baseline. -Picc line placed on his right arm now -WBC's fluctuating; non toxic on exam.  SIRS/HCAP/bacteremia:  Bacterial versus viral etiology. Trach dependent. Of note patient was supposed to be currently on doxycycline for tracheitis - but per mother report, medication inconsistently given as [facility] stop treatment after 7 days; no communication with physician obtained prior to medication discontinuation  -D/C Doxy on admission  - Pneumonia order set used - imipenem and vanc given on admission; now following blood cx's and ID rec's will continue just with cefepime (planning to treat for 2 weeks total; started on 10/20); pre-medication to be given to decrease chance of reactions/side effects. -no fever and WBC's trending down (slight confounding as he is getting steroids for antibiotic premedication)  UTI: Suprapubic cath in place in order to keep chronic sacral decubitus ulcers clean and dry. Urinalysis chronically abnormal. Per report by family catheter is due for change every 3 weeks (due now). Bladder scan showed bladder with mild leakage of urine around the urostomy site - Antibiotics as above (initially imipenem); now switched to cefepime  - UCX demonstrating multiple species - Urostomy catheter exchange ordered on admission  - Urology consulted and Appreciate their input; plan is to continue oxybutinin for spasm and decrease leakage   Sacral decubitus ulcers/osteomyelitis: Chronic. Wound VAC in place for several weeks. Family endorses seeing Dr. Marla Roe. I have called her office and spoken to support staff directly about patient he stated they have not seen this patient in the clinic in years. They have seen the patient in the hospital from time to time. It appears that patient was scheduled to start with a new wound care team at [place], Dr. Norberto Sorenson, [facility] [phone]). MRI under anesthesia performed on 07/05/2016 showing "Mild marrow edema in the right posterior acetabulum with enhancement which may be reactive versus secondary to mild osteomyelitis." -Dr Marla Roe received pictures  taken by [facility] nurse; no new rec's. Patient  to follow with plastic surgeon at [facility] as schedule. -will continue antibiotics as mentioned above  -Consideration to repeat MRI, will defer to ID/plastic surgery if needed.  -Air overlay mattress ordered and in use -Continue wound care, to include collagenase ointment Monday Wednesday Friday and wound VAC  Anemia: Initial CBC showing hemoglobin of 6.6. Repeat labs showing 8.2. Patient's baseline around 9. No reported GI loss. Suspect combination of lab error and ongoing chronic disease and chronic oozing from sacral/decubitus ulcers.  -1 unit PRBC ordered by EDP - Follow-up CBC to assess Hgb trend  -Hgb 7.4 (on 10/23)  Functional quadriplegia: Secondary to C6 ischemic injury several years ago. -continue supportive care -Patient to be discharge home with [facility] services and family assistance for mobility support; basically bed bound   Hyponatremia: 129. Chronic. -corrected after Normal saline IVF infusion -will follow electrolytes trend intermittently  -BMET in am  Adrenal insufficiency: No evidence of being in crisis. -Continue solumedrol for now; then resume hydrocortisone  -will continue midodrine at current dose  Hypotension: Likely secondary to chronic adrenal insufficiency -Continue Midodrine and solumedrol for now (as needed for premedication on antibiotic) -cortisol level was 19 -IVF support as needed -will resume his hydrocortisone once no longer on solumedrol   Diabetes: Type 1. - Continue Lantus and SSI -hypoglycemic regimen adjusted -has been experiencing elevated CBG's due to tube feedings, steroid use and improve appetite CBG's has been running high   Nutrition: Per report by mother/caregiver patient recently began eating full meals, requiring less PEG tube utilization.  -Nutritional service consulted for PEG tube nutrition  -will continue twelve hours a day of peg tube; continue free water with TF - Allow oral  intake as tolerated with close supervision to prevent aspiration  -IR consulted to evaluate PEG functioning   Chronic pain: Secondary to chronic wounds and muscular skeletal pain from contractures secondary to paralysis. - Continue fentanyl patch, oxycodone, Baclofen, Lyrica, Voltaren gel -will continue home medication regimen and if possible avoid extra doses of opiates  GERD: - Continue the PPI  Depression/Anxiety: -Continue Ativan, Wellbutrin -no SI or hallucinations appreciated on today's exam  Chronic constipation: -Continue bisacodyl  Code Status: Full Family Communication: mother at bedside  Disposition Plan: will remain inpatient; abx's switched to cefepime as per Id rec's; tracheostomy has been exchanged now and patient overall stable. Will organized for IV therapy as an outpatient. WBC's trending up (but probably due to steroids premedication); hopefully home tomorrow 10/24 with [facility] services. Discharge on 10/23 on hold due to malfunctioning PEG tube; also his WBC's continue fluctuating and has new positive 1/2 blood cx for E. Coli.    Consultants:  ID  Urology   Procedures:  See below for x-ray reports   Antibiotics:  Vancomycin 10/17>>>10/17  Rocephin 10/17>> one dose given  Imipenem 10/17 one dose given and resumed again on 10/18>>10/20  Cefepime 10/20 (looking to treat for 2 weeks total)  HPI/Subjective: Afebrile, doing better overall and in no acute distress. Oriented X 3. Patient with malfunctioning PEG tube. WBC's continue fluctuating and has new positive 1/2 blood cx for E. Coli.   Objective: Vitals:   07/30/16 1109 07/30/16 1200  BP:  117/73  Pulse: 89 100  Resp: 20 20  Temp:  100.1 F (37.8 C)    Intake/Output Summary (Last 24 hours) at 07/30/16 1538 Last data filed at 07/30/16 1419  Gross per 24 hour  Intake          1523.33 ml  Output  2850 ml  Net         -1326.67 ml   Filed Weights   07/24/16 0109 07/24/16 2100  07/26/16 1935  Weight: 59 kg (130 lb) 54.9 kg (121 lb 0.5 oz) 61.2 kg (135 lb)    Exam:   General:  Afebrile and in no major distress; able to follow commands and has remained oriented X3. Patient with malfunctioning PEG tube. WBC's continue fluctuating and has new positive 1/2 blood cx for E. Coli.   Cardiovascular: no rubs, no gallops, S1 and S2  Respiratory: scattered rhonchi, no crackles   Abdomen: soft, NT, ND, positive BS; peg tube and suprapubic catheter in place.  Musculoskeletal: no edema or cyanosis   Skin: multiple wounds (affecting thigh left, pressure ulcer stage 4 sacral area and ischium, bilateral toes, calcaneous)  Data Reviewed: Basic Metabolic Panel:  Recent Labs Lab 07/24/16 0116 07/24/16 0209 07/25/16 1240 07/26/16 0612  NA 127* 129* 133* 133*  K 4.4 4.5 4.2 5.1  CL 100* 100* 108 108  CO2 17*  --  19* 20*  GLUCOSE 160* 157* 116* 221*  BUN 28* 30* 22* 24*  CREATININE 1.07 1.00 0.64 0.61  CALCIUM 8.4*  --  8.4* 7.8*   Liver Function Tests:  Recent Labs Lab 07/24/16 0116  AST 17  ALT 16*  ALKPHOS 173*  BILITOT 0.7  PROT 6.7  ALBUMIN 1.7*    Recent Labs Lab 07/24/16 0116  AMMONIA 25   CBC:  Recent Labs Lab 07/24/16 0116  07/25/16 1240 07/26/16 0612 07/27/16 0742 07/28/16 0623 07/29/16 0554 07/30/16 0400  WBC 40.0*  --  33.0* 27.9* 27.5* 31.2* 29.3* 35.1*  NEUTROABS 35.6*  --  28.7*  --   --   --   --   --   HGB 6.6*  < > 7.9* 7.7* 8.0* 7.9* 7.7* 7.4*  HCT 21.5*  < > 24.7* 25.1* 25.9* 25.4* 25.1* 23.9*  MCV 81.1  --  80.7 81.8 83.3 82.2 83.4 84.5  PLT 887*  --  847* 595* 907* 766* 756* 656*  < > = values in this interval not displayed. CBG:  Recent Labs Lab 07/29/16 1301 07/29/16 1750 07/29/16 2209 07/30/16 0741 07/30/16 1234  GLUCAP 149* 147* 153* 117* 114*    Recent Results (from the past 240 hour(s))  Urine culture     Status: Abnormal   Collection Time: 07/24/16  1:35 AM  Result Value Ref Range Status    Specimen Description URINE, CLEAN CATCH  Final   Special Requests NONE  Final   Culture MULTIPLE SPECIES PRESENT, SUGGEST RECOLLECTION (A)  Final   Report Status 07/26/2016 FINAL  Final  Blood culture (routine x 2)     Status: Abnormal   Collection Time: 07/24/16  2:00 AM  Result Value Ref Range Status   Specimen Description BLOOD RIGHT HAND  Final   Special Requests IN PEDIATRIC BOTTLE 4ML  Final   Culture  Setup Time   Final    AEROBIC BOTTLE ONLY GRAM POSITIVE COCCI IN PAIRS CRITICAL RESULT CALLED TO, READ BACK BY AND VERIFIED WITH: A. MAYER, [place] ON 07/24/16 BY C. JESSUP, MLT. GRAM NEGATIVE RODS CRITICAL RESULT CALLED TO, READ BACK BY AND VERIFIED WITH: Karlene Einstein PHARMD, AT [id number] 07/25/16    Culture (A)  Final    GROUP B STREP(S.AGALACTIAE)ISOLATED PROTEUS MIRABILIS    Report Status 07/26/2016 FINAL  Final   Organism ID, Bacteria GROUP B STREP(S.AGALACTIAE)ISOLATED  Final   Organism ID, Bacteria PROTEUS  MIRABILIS  Final      Susceptibility   Group b strep(s.agalactiae)isolated - MIC*    CLINDAMYCIN <=0.25 SENSITIVE Sensitive     AMPICILLIN <=0.25 SENSITIVE Sensitive     ERYTHROMYCIN 4 RESISTANT Resistant     VANCOMYCIN 0.5 SENSITIVE Sensitive     CEFTRIAXONE <=0.12 SENSITIVE Sensitive     LEVOFLOXACIN 0.5 SENSITIVE Sensitive     * GROUP B STREP(S.AGALACTIAE)ISOLATED   Proteus mirabilis - MIC*    AMPICILLIN <=2 SENSITIVE Sensitive     CEFAZOLIN 8 SENSITIVE Sensitive     CEFEPIME <=1 SENSITIVE Sensitive     CEFTAZIDIME <=1 SENSITIVE Sensitive     CEFTRIAXONE <=1 SENSITIVE Sensitive     CIPROFLOXACIN <=0.25 SENSITIVE Sensitive     GENTAMICIN <=1 SENSITIVE Sensitive     IMIPENEM 2 SENSITIVE Sensitive     TRIMETH/SULFA <=20 SENSITIVE Sensitive     AMPICILLIN/SULBACTAM <=2 SENSITIVE Sensitive     PIP/TAZO <=4 SENSITIVE Sensitive     * PROTEUS MIRABILIS  Blood Culture ID Panel (Reflexed)     Status: Abnormal   Collection Time: 07/24/16  2:00 AM  Result Value  Ref Range Status   Enterococcus species NOT DETECTED NOT DETECTED Final   Listeria monocytogenes NOT DETECTED NOT DETECTED Final   Staphylococcus species NOT DETECTED NOT DETECTED Final   Staphylococcus aureus NOT DETECTED NOT DETECTED Final   Streptococcus species DETECTED (A) NOT DETECTED Final    Comment: CRITICAL RESULT CALLED TO, READ BACK BY AND VERIFIED WITH: A. MAYER, [place] ON 07/24/16 BY C. JESSUP, MLT.    Streptococcus agalactiae DETECTED (A) NOT DETECTED Final    Comment: CRITICAL RESULT CALLED TO, READ BACK BY AND VERIFIED WITH: A. MAYER, [place] ON 07/24/16 BY C. JESSUP, MLT.    Streptococcus pneumoniae NOT DETECTED NOT DETECTED Final   Streptococcus pyogenes NOT DETECTED NOT DETECTED Final   Acinetobacter baumannii NOT DETECTED NOT DETECTED Final   Enterobacteriaceae species NOT DETECTED NOT DETECTED Final   Enterobacter cloacae complex NOT DETECTED NOT DETECTED Final   Escherichia coli NOT DETECTED NOT DETECTED Final   Klebsiella oxytoca NOT DETECTED NOT DETECTED Final   Klebsiella pneumoniae NOT DETECTED NOT DETECTED Final   Proteus species NOT DETECTED NOT DETECTED Final   Serratia marcescens NOT DETECTED NOT DETECTED Final   Haemophilus influenzae NOT DETECTED NOT DETECTED Final   Neisseria meningitidis NOT DETECTED NOT DETECTED Final   Pseudomonas aeruginosa NOT DETECTED NOT DETECTED Final   Candida albicans NOT DETECTED NOT DETECTED Final   Candida glabrata NOT DETECTED NOT DETECTED Final   Candida krusei NOT DETECTED NOT DETECTED Final   Candida parapsilosis NOT DETECTED NOT DETECTED Final   Candida tropicalis NOT DETECTED NOT DETECTED Final  Blood culture (routine x 2)     Status: Abnormal   Collection Time: 07/24/16  4:05 AM  Result Value Ref Range Status   Specimen Description BLOOD RIGHT FOREARM  Final   Special Requests AEROBIC BOTTLE ONLY 5ML  Final   Culture  Setup Time   Final    AEROBIC BOTTLE ONLY GRAM POSITIVE COCCI IN  PAIRS CRITICAL VALUE NOTED.  VALUE IS CONSISTENT WITH PREVIOUSLY REPORTED AND CALLED VALUE. GRAM NEGATIVE RODS    Culture (A)  Final    GROUP B STREP(S.AGALACTIAE)ISOLATED PROTEUS MIRABILIS SUSCEPTIBILITIES PERFORMED ON PREVIOUS CULTURE WITHIN THE LAST 5 DAYS.    Report Status 07/26/2016 FINAL  Final  Culture, respiratory (NON-Expectorated)     Status: None   Collection Time: 07/24/16 12:25 PM  Result Value Ref Range Status   Specimen Description TRACHEAL ASPIRATE  Final   Special Requests Normal  Final   Gram Stain   Final    MODERATE WBC PRESENT, PREDOMINANTLY PMN FEW GRAM POSITIVE COCCI IN PAIRS IN CLUSTERS RARE GRAM NEGATIVE COCCOBACILLI RARE GRAM NEGATIVE RODS RARE GRAM POSITIVE RODS    Culture MULTIPLE ORGANISMS PRESENT, NONE PREDOMINANT  Final   Report Status 07/26/2016 FINAL  Final  MRSA PCR Screening     Status: Abnormal   Collection Time: 07/25/16  6:38 PM  Result Value Ref Range Status   MRSA by PCR POSITIVE (A) NEGATIVE Final    Comment:        The GeneXpert MRSA Assay (FDA approved for NASAL specimens only), is one component of a comprehensive MRSA colonization surveillance program. It is not intended to diagnose MRSA infection nor to guide or monitor treatment for MRSA infections. CRITICAL RESULT CALLED TO, READ BACK BY AND VERIFIED WITH: TJasper Riling, RN AT 2150 ON 07/25/16 BY C. JESSUP, MLT.   Culture, blood (routine x 2)     Status: Abnormal (Preliminary result)   Collection Time: 07/26/16  6:15 AM  Result Value Ref Range Status   Specimen Description BLOOD LEFT ANTECUBITAL  Final   Special Requests IN PEDIATRIC BOTTLE  4CC  Final   Culture  Setup Time   Final    GRAM NEGATIVE RODS IN PEDIATRIC BOTTLE CRITICAL RESULT CALLED TO, READ BACK BY AND VERIFIED WITH: PHARMD A.MASTERS [id number] @0808  MLM    Culture ESCHERICHIA COLI (A)  Final   Report Status PENDING  Incomplete  Blood Culture ID Panel (Reflexed)     Status: Abnormal   Collection Time:  07/26/16  6:15 AM  Result Value Ref Range Status   Enterococcus species NOT DETECTED NOT DETECTED Final   Listeria monocytogenes NOT DETECTED NOT DETECTED Final   Staphylococcus species NOT DETECTED NOT DETECTED Final   Staphylococcus aureus NOT DETECTED NOT DETECTED Final   Streptococcus species NOT DETECTED NOT DETECTED Final   Streptococcus agalactiae NOT DETECTED NOT DETECTED Final   Streptococcus pneumoniae NOT DETECTED NOT DETECTED Final   Streptococcus pyogenes NOT DETECTED NOT DETECTED Final   Acinetobacter baumannii NOT DETECTED NOT DETECTED Final   Enterobacteriaceae species DETECTED (A) NOT DETECTED Final    Comment: CRITICAL RESULT CALLED TO, READ BACK BY AND VERIFIED WITH: PHARMD A.MASTERS [id number] 0808 MLM    Enterobacter cloacae complex NOT DETECTED NOT DETECTED Final   Escherichia coli DETECTED (A) NOT DETECTED Final    Comment: CRITICAL RESULT CALLED TO, READ BACK BY AND VERIFIED WITH: PHARMD A.MASTERS [id number] 0808 MLM    Klebsiella oxytoca NOT DETECTED NOT DETECTED Final   Klebsiella pneumoniae NOT DETECTED NOT DETECTED Final   Proteus species NOT DETECTED NOT DETECTED Final   Serratia marcescens NOT DETECTED NOT DETECTED Final   Carbapenem resistance NOT DETECTED NOT DETECTED Final   Haemophilus influenzae NOT DETECTED NOT DETECTED Final   Neisseria meningitidis NOT DETECTED NOT DETECTED Final   Pseudomonas aeruginosa NOT DETECTED NOT DETECTED Final   Candida albicans NOT DETECTED NOT DETECTED Final   Candida glabrata NOT DETECTED NOT DETECTED Final   Candida krusei NOT DETECTED NOT DETECTED Final   Candida parapsilosis NOT DETECTED NOT DETECTED Final   Candida tropicalis NOT DETECTED NOT DETECTED Final  Culture, blood (routine x 2)     Status: None (Preliminary result)   Collection Time: 07/26/16  6:23 AM  Result Value Ref Range Status   Specimen Description BLOOD  LEFT ANTECUBITAL  Final   Special Requests IN PEDIATRIC BOTTLE  3CC  Final   Culture NO GROWTH 4  DAYS  Final   Report Status PENDING  Incomplete     Studies: No results found.  Scheduled Meds: . albuterol  2.5 mg Nebulization BID  . baclofen  20 mg Oral QID  . buPROPion  150 mg Oral Daily  . ceFEPime (MAXIPIME) IV  2 g Intravenous Q12H  . docusate  100 mg Oral Daily  . famotidine  20 mg Oral BID  . feeding supplement (GLUCERNA 1.2 CAL)  1,000 mL Per Tube Daily  . fentaNYL  75 mcg Transdermal Q72H  . free water  300-400 mL Per Tube Q8H  . heparin  5,000 Units Subcutaneous Q8H  . insulin aspart  0-15 Units Subcutaneous TID WC  . insulin aspart  0-5 Units Subcutaneous QHS  . insulin glargine  22 Units Subcutaneous QHS  . magnesium oxide  400 mg Oral BID  . methylPREDNISolone (SOLU-MEDROL) injection  20 mg Intravenous Q12H  . mupirocin ointment  1 application Nasal BID  . oxybutynin  5 mg Per Tube BID  . pregabalin  100 mg Oral TID  . sodium chloride flush  10-40 mL Intracatheter Q12H  . sodium chloride flush  3 mL Intravenous Q12H   Continuous Infusions: . 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 If 7PM-7AM, please contact night-coverage at [email or web address], password [facility] 07/30/2016, 3:38 PM  LOS: 6 days

## 2016-07-30 NOTE — Progress Notes (Addendum)
[name] for Infectious Disease    Date of Admission:  07/24/2016   Total days of antibiotics 3      ID: Jason [name] is a 30 y.o. 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 Patient denies any fever, chills, shortness of breath, cough or nausea. His stool output remains at his baseline.  Medications:  . albuterol  2.5 mg Nebulization BID  . baclofen  20 mg Oral QID  . buPROPion  150 mg Oral Daily  . ceFEPime (MAXIPIME) IV  2 g Intravenous Q12H  . docusate  100 mg Oral Daily  . famotidine (PEPCID) IV  20 mg Intravenous Q12H  . feeding supplement (GLUCERNA 1.2 CAL)  1,000 mL Per Tube Daily  . fentaNYL  75 mcg Transdermal Q72H  . free water  300-400 mL Per Tube Q8H  . heparin  5,000 Units Subcutaneous Q8H  . hydrocortisone  10 mg Oral Q24H  . hydrocortisone  20 mg Oral q morning - 10a    . insulin aspart  0-15 Units Subcutaneous TID WC  . insulin aspart  0-5 Units Subcutaneous QHS  . insulin glargine  22 Units Subcutaneous QHS  . magnesium oxide  400 mg Oral BID  . methylPREDNISolone (SOLU-MEDROL) injection  20 mg Intravenous Q12H  . mupirocin ointment  1 application Nasal BID  . oxybutynin  5 mg Per Tube BID  . pregabalin  100 mg Oral TID  . sodium chloride flush  10-40 mL Intracatheter Q12H  . sodium chloride flush  3 mL Intravenous Q12H    Objective: Vitals:   07/29/16 2210 07/30/16 0100 07/30/16 0600 07/30/16 0635  BP: 103/65   115/68  Pulse: (!) 114 99 91 93  Resp: 18 18 18 18   Temp: 99.6 F (37.6 C)   99.8 F (37.7 C)  TempSrc: Oral   Oral  SpO2: 98% 99% 100% 99%  Weight:      Height:       General: Vital signs reviewed.  Patient is in no acute distress and cooperative with exam.  Neck: Trach in place. Cardiovascular: RRR Pulmonary/Chest: CTA b/l. No rhonchi, wheezing or rales.  Abdominal: Soft, non-tender, non-distended, BS + Neurological: Alert, awake, answers all questions  Lab Results  Recent Labs  07/29/16 0554 07/30/16 0400  WBC 29.3* 35.1*  HGB 7.7* 7.4*  HCT 25.1* 23.9*   Microbiology: UCx 10/17: Multiple species BCx 10/17: Strep Agalactiae and Proteus  Respiratory Cx 10/16: few gram positive cocci in pairs in clusters  BCx 10/19 >> E. Coli in 1/2   Current antibiotics: Vancomycin 10/17>> 10/17 Imipenem-Cilastatin 10/17 >> 10/19 Cefepime 10/17 >> 10/17; 10/19 >>  Assessment/Plan: 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  Streptococcus Agalactiae and Proteus Bacteremia: Patient continues to improve clinically. Afebrile, WBC has trended 40 >27.9> 27.5>35.1 this morning. Bacteremia likely secondary to underlying UTI, as patient's wounds were evaluated by Plastic Surgery and felt they were unchanged from prior. Patient was transitioned to Cefepime on 10/19 and will need at least a 2 week course. PICC line was placed on 10/20. Trach changed 10/20. -Continue Cefepime and pre-medicate; will need 2 week course once BCx negative -Wound Care Following   E. Coli in 1/2 Blood Cx from 10/18: In 1/2 bottles. Resulted positive morning of 10/23. Unclear if this a contaminant or real. We will repeat BCx today. If clinically worsening, would consider CT abd/pelvis to evaluate for a renal abscess or another cause for continued gram negative bacteremia. If repeat BCx are positive, would evaluate for endocarditis. Also, labile WBC is concerning. Likely partly due to steroids-question if we can d/c hydrocortisone while patient is receiving solumedrol if that is sufficient. I will look into this.  -Repeat BCX, if positive, will need further evaluation for endocarditis  -If clinically worsening, consider CT abdomen/pelvis -Can we d/c hydrocortisone as solumedrol should be sufficient to cover adrenal insufficiency at current dose-switch back to hydrocortisone after abx are completed  Martyn Malay, DO PGY-3 Internal Medicine Resident Pager # [phone] 07/30/2016 10:05 AM

## 2016-07-30 NOTE — Consult Note (Addendum)
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   Sacral wound has small amount of red granulation tissue to upper wound,   Pt tolerated dressing change without distress, applied one piece of black foam to each wound and bridged all sites together to 187mm cont suction. Skin prep applied to periwound to help seal drape. Barrier ring applied around lower wound edges to maintain a seal.  Plan to change dressing on Wednesday. Mother concerned about break down around suprapubic cath and G-tube sire, encouraged to use barrier cream to areas.  Julien Girt MSN, RN, [place], [place], [place]

## 2016-07-30 NOTE — Progress Notes (Signed)
[facility]  New pt this admission for [facility] though pt is well known to [facility] from previous courses of IV ABX at home.  [facility] will provide IV ABX (Cefepime) and premeds (Solumedrol and Pepcid IV) for home.   Communicated with [name], RN with [facility] regarding plan for IV ABX with premeds at home.  Plan for first home dose at 10 PM tonight 07-30-16.  If patient discharges after hours, please call [phone].   Larry Sierras 07/30/2016, 12:18 PM

## 2016-07-30 NOTE — Progress Notes (Signed)
Inpatient Diabetes Program Recommendations  AACE/ADA: New Consensus Statement on Inpatient Glycemic Control (2015)  Target Ranges:  Prepandial:   less than 140 mg/dL      Peak postprandial:   less than 180 mg/dL (1-2 hours)      Critically ill patients:  140 - 180 mg/dL   Results for KOBEE, NONG (MRN [id number]) as of 07/30/2016 15:07  Ref. Range 07/04/2016 11:27  Hemoglobin A1C Latest Ref Range: 4.8 - 5.6 % 8.0 (H)  Results for MAKEEN, GREENOUGH (MRN [id number]) as of 07/30/2016 15:07  Ref. Range 07/29/2016 08:43 07/29/2016 13:01 07/29/2016 17:50 07/29/2016 22:09 07/30/2016 07:41 07/30/2016 12:34  Glucose-Capillary Latest Ref Range: 65 - 99 mg/dL 152 (H) 149 (H) 147 (H) 153 (H) 117 (H) 114 (H)   Review of Glycemic Control  Diabetes history: DM1, on steroids Outpatient Diabetes medications: Lantus 15 units hs+ Novolog 0-10 units correction scale Novolog correction starting at 201 mg/dL  Current orders for Inpatient glycemic control: Novolog correction 0-15 units TIDAC and 0-5 units QHS; Lantus 22 units daily QHS  Inpatient Diabetes Program Recommendations: Currently controlled with medication regimen.  Will continue to follow CBG trends.  Thank you,  Windy Carina, RN, BSN Diabetes Coordinator Inpatient Diabetes Program [phone] (Team Pager)

## 2016-07-30 NOTE — Progress Notes (Signed)
Am trach check not completed due to patient not being available because of a procedure.

## 2016-07-31 LAB — BASIC METABOLIC PANEL
ANION GAP: 7 (ref 5–15)
BUN: 15 mg/dL (ref 6–20)
CHLORIDE: 101 mmol/L (ref 101–111)
CO2: 25 mmol/L (ref 22–32)
Calcium: 8.2 mg/dL — ABNORMAL LOW (ref 8.9–10.3)
Creatinine, Ser: 0.55 mg/dL — ABNORMAL LOW (ref 0.61–1.24)
GFR calc Af Amer: >60 mL/min (ref >60)
GLUCOSE: 123 mg/dL — AB (ref 65–99)
POTASSIUM: 4.4 mmol/L (ref 3.5–5.1)
SODIUM: 133 mmol/L — AB (ref 135–145)

## 2016-07-31 LAB — CBC
HCT: 23.9 % — ABNORMAL LOW (ref 39.0–52.0)
HEMOGLOBIN: 7.3 g/dL — AB (ref 13.0–17.0)
MCH: 26.2 pg (ref 26.0–34.0)
MCHC: 30.5 g/dL (ref 30.0–36.0)
MCV: 85.7 fL (ref 78.0–100.0)
Platelets: 606 10*3/uL — ABNORMAL HIGH (ref 150–400)
RBC: 2.79 MIL/uL — AB (ref 4.22–5.81)
RDW: 17.1 % — ABNORMAL HIGH (ref 11.5–15.5)
WBC: 35.4 10*3/uL — AB (ref 4.0–10.5)

## 2016-07-31 LAB — CULTURE, BLOOD (ROUTINE X 2): CULTURE: NO GROWTH

## 2016-07-31 LAB — GLUCOSE, CAPILLARY
GLUCOSE-CAPILLARY: 93 mg/dL (ref 65–99)
Glucose-Capillary: 127 mg/dL — ABNORMAL HIGH (ref 65–99)
Glucose-Capillary: 191 mg/dL — ABNORMAL HIGH (ref 65–99)
Glucose-Capillary: 125 mg/dL — ABNORMAL HIGH (ref 65–99)

## 2016-07-31 MED ORDER — FAMOTIDINE 20 MG PO TABS: 20.0000 mg | ORAL_TABLET | Freq: Two times a day (BID) | ORAL | 1 refills | Status: AC

## 2016-07-31 MED ORDER — HYDROCORTISONE 20 MG PO TABS: 20.0000 mg | ORAL_TABLET | Freq: Every morning | ORAL | Status: DC

## 2016-07-31 MED ORDER — DEXTROSE 5 % IV SOLN: 2.0000 g | Freq: Two times a day (BID) | INTRAVENOUS | 0 refills | Status: DC

## 2016-07-31 MED ORDER — METHYLPREDNISOLONE SODIUM SUCC 40 MG IJ SOLR: 20.0000 mg | Freq: Two times a day (BID) | INTRAMUSCULAR | 0 refills | Status: DC

## 2016-07-31 MED ORDER — HYDROCORTISONE 10 MG PO TABS: 10.0000 mg | ORAL_TABLET | Freq: Every evening | ORAL | Status: DC

## 2016-07-31 MED ORDER — INSULIN GLARGINE 100 UNIT/ML ~~LOC~~ SOLN
22.0000 [IU] | Freq: Every day | SUBCUTANEOUS | Status: AC
Start: 2016-07-31 — End: ?

## 2016-07-31 MED ORDER — OXYBUTYNIN CHLORIDE 5 MG PO TABS: 5.0000 mg | ORAL_TABLET | Freq: Two times a day (BID) | ORAL | 1 refills | Status: DC

## 2016-07-31 NOTE — Discharge Instructions (Signed)
Barrier cream to areas around G tube and SP cath where drainage is leaking; apply PRN

## 2016-07-31 NOTE — Discharge Summary (Signed)
Physician Discharge Summary  Jason [name] [place] ZOX:[id number] DOB: Nov 30, 1985 DOA: 07/24/2016  PCP: [name], MD  Admit date: 07/24/2016 Discharge date: 08/01/2016  Time spent: 35 minutes  Recommendations for Outpatient Follow-up:  1. Repeat CBC to follow up Hgb trend 2. 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 Will follow up with PCP in 2 weeks. Continue IV antibiotics until 08/10/16 (home health services arranged for this).  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 Level V caveat applies as patient is unable to provide reliable history at this time. History provided by patient's mother who is his primary caretaker. Symptoms started around 1600 on  07/23/2016. Patient was essentially unresponsive for family when they tried to wake him up. When patient would awake he endorsed being scared and would cryand acted confused with regards to his surroundings. Mother states that this happens to him from time to time typically when there is an underlying infectious process ongoing.Last Foley change 2 weeks ago. Per family there've been no recent focal complaints.    Hospital Course:  Acute encephalopathy: likely from infectious etiology -Dx including chronic sacral decubitus ulcers with osteomyelitis, UTI and bacteremia. No recent medication changes, no significant metabolic abnormalities or recent seizures reported.  -Chest x-ray concerning for right lower lobe pneumonia, UA grossly abnormal the patient is chronically with suprapubic cath, WBC 40,000 with left shift, afebrile currently. -Vancomycin and imipenem (given on admission) -blood cx's with positive proteus, 1/2 E. Coli and strep species -following ID rec's will use cefepime and following final culture data will treat for 2 weeks total (last dose anticipated 08/10/16) -mentation is back to baseline. -Non toxic on exam.  SIRS/bacteremia/: Bacterial versus viral etiology. Trach dependent. Of note patient was supposed to be currently on doxycycline for tracheitis. - but per mother report, medication inconsistently given as [facility] services stop treatment after 7 days; no communication with physician obtained prior to medication discontinuation  -ok to discontinue as per ID rec's at this time.  - imipenem and vanc given on admission; now following blood cx's and ID rec's will continue just with cefepime (planning to treat for 2 weeks total; started on 10/20); pre-medication to be given to decrease chance of reactions/side effects. -no fever and WBC's trending down (slight confounding level as he is getting steroids for antibiotic premedication). -Picc line placed on his right arm now for  outpatient  abx's therapy  UTI associated with chronic suprapubic catheter: Suprapubic cath in place in order to keep chronic sacral decubitus ulcers clean and dry. Urinalysis chronically abnormal. Per report by family catheter is -Bladder scan showed bladder with mild leakage of urine around the urostomy site - Antibiotics as above (initially imipenem); now switched to cefepime (plan is to treat until 08/10/16) - UCX demonstrating multiple species - suprapubic catheter catheter exchanged - Urology consulted and Appreciate their input; plan is to continue oxybutinin for spasm and to hopefully decrease leakage. They will follow with patient at discharge (Dr. Tresa Moore)  Sacral decubitus ulcers/osteomyelitis: Chronic. Wound VAC in place for several weeks. Family endorses seeing Dr. Marla Roe. I have called her office and spoken to support staff directly about patient he stated they have not seen this patient in the clinic in years. They have seen the patient in the hospital from time to time. It appears that patient was scheduled to start with a new wound care team at [facility], Dr. Norberto Sorenson, [place] [phone]). MRI under anesthesia performed on 07/05/2016 showing "Mild marrow edema in the right posterior acetabulum with enhancement which may be reactive versus secondary to mild osteomyelitis." -Dr Marla Roe received pictures taken by [facility] nurse; no new rec's. Patient to follow with plastic surgeon at [facility] as schedule. -will continue antibiotics as mentioned above  -Continue wound care, to include collagenase ointment Monday Wednesday Friday and wound VAC. -wound care nurse to provide instructions to [facility] services for outpatient care of his wounds.  Anemia: Initial CBC showing hemoglobin of 6.6. Repeat labs showing 8.2. Patient's baseline around 9. No reported GI loss. Suspect combination of lab error and ongoing chronic disease and chronic oozing from sacral/decubitus ulcers.  -1 unit PRBC given during  this hospitalization - Follow-up CBC to assess Hgb trend  -Hgb 7.4 (on 10/24)  Functional quadriplegia: Secondary to C6 ischemic injury several years ago. -continue supportive care -Patient to be discharge home with [facility] services and family assistance for mobility support; basically bed bound at baseline.  Hyponatremia: 129 on admission. Chronic. -corrected after Normal saline IVF infusion and use of steroids -will recommend BMET to follow up on electrolytes  -at discharge last Na 133 range  Adrenal insufficiency: No evidence of being in crisis. -Continue solumedrol for now; then resume hydrocortisone  -will continue midodrine at current dose -cortisol level was 19  Hypotension: Likely secondary to chronic adrenal insufficiency -Continue Midodrine and solumedrol for now (as needed for premedication on antibiotic) -will resume his hydrocortisone once no longer on solumedrol (on 08/11/16)  Diabetes: Type 1. -Continue Lantus and SSI -hypoglycemic regimen adjusted (lantus up from 15 units to 22 units) -has been experiencing elevated CBG's due to tube feedings, steroid use and improve appetite  -at discharge CBG's stable again  Nutrition: Per report by mother/caregiver patient recently began eating full meals, requiring less PEG tube utilization.  -Nutritional service consulted for PEG tube nutrition  -will continue twelve hours a day of peg tube; continue free water with TF -Allow oral intake as tolerated with close supervision to prevent aspiration   Chronic pain: Secondary to chronic wounds and muscular skeletal pain from contractures secondary to paralysis. -Continue fentanyl patch, oxycodone, Baclofen, Lyrica and, Voltaren gel -will continue home medication regimen   GERD: -Continue pepcid now  Depression/Anxiety: -Continue home regimen (Ativan, Wellbutrin) -no SI or hallucinations appreciated on today's exam  Chronic constipation: -Continue bisacodyl as previously  prescribed   PEG tube malfunction  -IR evaluate patient  and repositioned PEG -plan is to observed for now and if leaking continue will need exchange as an outpatient  Procedures:  See below for x-ray reports   Consultations:  ID  Urology  [facility] services  IR  Discharge Exam: Vitals:   07/31/16 1131 07/31/16 1530  BP:    Pulse: 89 88  Resp: 18 18  Temp:      General:  Afebrile and in no major distress; able to follow commands and has remained oriented X3. Patient denies CP and SOB. WBC's continue fluctuating.  Cardiovascular: no rubs, no gallops, S1 and S2  Respiratory: scattered rhonchi, no crackles   Abdomen: soft, NT, ND, positive BS; peg tube and suprapubic catheter in place.  Musculoskeletal: no edema or cyanosis   Skin: multiple wounds (affecting thigh left, pressure ulcer stage 4 sacral area and ischium, bilateral toes, calcaneous). Very mild irritation from leaking around Suprapubic catheter and PEG tube area.    Discharge Instructions   Discharge Instructions    Discharge instructions    Complete by:  As directed    Maintain adequate hydration Take medications as prescribed Follow up with plastic surgery service as previously scheduled Antibiotics until 08/10/16 Arrange follow up with PCP in 2 weeks     Current Discharge Medication List    START taking these medications   Details  ceFEPIme 2 g in dextrose 5 % 50 mL Inject 2 g into the vein every 12 (twelve) hours. Qty: 40 g, Refills: 0    famotidine (PEPCID) 20 MG tablet Take 1 tablet (20 mg total) by mouth 2 (two) times daily. Take around antibiotics infusion time Qty: 60 tablet, Refills: 1    methylPREDNISolone sodium succinate (SOLU-MEDROL) 40 mg/mL injection Inject 0.5 mLs (20 mg total) into the vein every 12 (twelve) hours. " dispense 21G x 1" needle, 62m syringes for IV Solumedrol administration at home." Qty: 10 mL, Refills: 0    oxybutynin (DITROPAN) 5 MG tablet Place 1 tablet (5 mg  total) into feeding tube 2 (two) times daily. Qty: 60 tablet, Refills: 1      CONTINUE these medications which have CHANGED   Details  !! hydrocortisone (CORTEF) 10 MG tablet Take 1 tablet (10 mg total) by mouth every evening. Hold for the next 10 days, while using solumedrol. Then resume as previously instructed    !! hydrocortisone (CORTEF) 20 MG tablet Take 1 tablet (20 mg total) by mouth every morning. Hold for the next 10 days, while on solumedrol. Resume after this time as previously    insulin glargine (LANTUS) 100 UNIT/ML injection Inject 0.22 mLs (22 Units total) into the skin at bedtime.     !! - Potential duplicate medications found. Please discuss with provider.    CONTINUE these medications which have NOT CHANGED   Details  acetaminophen (TYLENOL) 325 MG tablet Take 2 tablets (650 mg total) by mouth every 6 (six) hours as needed for mild pain, moderate pain, fever or headache.    albuterol (PROVENTIL) (2.5 MG/3ML) 0.083% nebulizer solution Take 3 mLs (2.5 mg total) by nebulization 4 (four) times daily. Qty: 75 mL, Refills: 3    baclofen (LIORESAL) 20 MG tablet Take 1 tablet (20 mg total) by mouth 4 (four) times daily. Qty: 120 tablet, Refills: 5   Associated Diagnoses: Sacral decubitus ulcer, unspecified pressure ulcer stage; Osteomyelitis, pelvic region and thigh ([name])    BISACODYL 5 MG EC tablet Take ONE (1) tablet by mouth twice daily as needed FOR MODERATE CONSTIPATION Qty: 60  tablet, Refills: 5    buPROPion (WELLBUTRIN XL) 150 MG 24 hr tablet Take 1 tablet (150 mg total) by mouth daily. Qty: 30 tablet, Refills: 11    camphor-menthol (SARNA) lotion Apply topically as needed for itching. Qty: 222 mL, Refills: 0    dexlansoprazole (DEXILANT) 60 MG capsule Take 1 capsule (60 mg total) by mouth daily. Qty: 30 capsule, Refills: 11    docusate (COLACE) 50 MG/5ML liquid Take 10 mLs (100 mg total) by mouth 2 (two) times daily. Qty: 473 mL, Refills: 11    fentaNYL  (DURAGESIC - DOSED MCG/HR) 75 MCG/HR place 1 patch (91mg total) onto the skin every 3 (three) days Qty: 10 patch, Refills: 0    ferrous sulfate 325 (65 FE) MG tablet Take 1 tablet (325 mg total) by mouth daily with breakfast. Qty: 30 tablet, Refills: 11    glucagon (GLUCAGON EMERGENCY) 1 MG injection INJECT INTO MUSCLE ONCE AS NEEDED Qty: 1 kit, Refills: 0    GLUCERNA (GLUCERNA) LIQD 1,000 mLs by PEG Tube route continuous. 70 ml/hr    HYDROmorphone (DILAUDID) 8 MG tablet Take ONE (1) tablet ('8mg'$  total) by mouth every 6 hours as needed for severe pain Qty: 120 tablet, Refills: 0    insulin aspart (NOVOLOG FLEXPEN) 100 UNIT/ML FlexPen Inject 0-10 Units into the skin 4 (four) times daily as needed for high blood sugar (CBG >200). Per sliding scale: CBG <200 = 0 units, 201-300 = 4 units, 301-400 = 6 units, >400 = 10 units    loratadine (CLARITIN) 10 MG tablet Take 1 tablet (10 mg total) by mouth daily. Qty: 30 tablet, Refills: 11    LORazepam (ATIVAN) 0.5 MG tablet Take 1 tablet (0.5 mg total) by mouth 3 (three) times daily as needed for anxiety. Reported on 11/02/2015 Qty: 90 tablet, Refills: 5    LYRICA 100 MG capsule Take ONE (1)  capsule by mouth three times daily (morning,afternoon and evening) and 2 capsules at bedtime Qty: 150 capsule, Refills: 5    Magnesium Oxide 400 (240 Mg) MG TABS Take 400 mg by mouth 2 (two) times daily. Refills: 11    midodrine (PROAMATINE) 10 MG tablet Take 2 tablets (20 mg total) by mouth 3 (three) times daily with meals. Qty: 180 tablet, Refills: 6    Multiple Vitamin (MULTIVITAMIN WITH MINERALS) TABS tablet Place 1 tablet into feeding tube daily.    mupirocin ointment (BACTROBAN) 2 % Apply to affected area twice a day as needed Qty: 22 g, Refills: 11    ondansetron (ZOFRAN ODT) 8 MG disintegrating tablet Place 1 tablet (8 mg total) into feeding tube every 8 (eight) hours as needed for nausea or vomiting. Qty: 60 tablet, Refills: 5    Oxycodone HCl  20 MG TABS Take 1 tablet (20 mg total) by mouth every 6 (six) hours as needed (pain). Qty: 120 tablet, Refills: 0    UNABLE TO FIND AQUACEL: Apply to feet and toes daily    VOLTAREN 1 % GEL apply 2 grams topically twice a day Qty: 100 g, Refills: 5    Water For Irrigation, Sterile (FREE WATER) SOLN Place 200 mLs into feeding tube every 8 (eight) hours. Qty: 10000 mL, Refills: 5    saccharomyces boulardii (FLORASTOR) 250 MG capsule Take 1 capsule (250 mg total) by mouth 2 (two) times daily. Qty: 60 capsule, Refills: 0      STOP taking these medications     doxycycline (VIBRA-TABS) 100 MG tablet      ciprofloxacin (CIPRO)  500 MG tablet      fluconazole (DIFLUCAN) 200 MG tablet      Magnesium Oxide 400 MG CAPS      nitrofurantoin, macrocrystal-monohydrate, (MACROBID) 100 MG capsule      pantoprazole sodium (PROTONIX) 40 mg/20 mL PACK        Allergies  Allergen Reactions  . Amikacin Sulfate Other (See Comments)    Seizure   . Cefuroxime Axetil Anaphylaxis    Tolerated cefepime 12/2015 - with Pepcid/Solu-Medrol  . Ertapenem Other (See Comments)    Rash and confusion-->tolerated Imipenem   . Morphine And Related Other (See Comments)    Changed mental status, confusion, headache, visual hallucination  . Nsaids Itching and Other (See Comments)    Risk of bleeding, itching  . Penicillins Anaphylaxis and Other (See Comments)    Tolerated Imipenem; no reaction to 7 day course of amoxicillin in 2015 Has patient had a PCN reaction causing immediate rash, facial/tongue/throat swelling, SOB or lightheadedness with hypotension: Yes Has patient had a PCN reaction causing severe rash involving mucus membranes or skin necrosis: No Has patient had a PCN reaction that required hospitalization Yes Has patient had a PCN reaction occurring within the last 10 years: No If all of the above answers are "NO", then may proceed with Cephalo  . Sulfa Antibiotics Anaphylaxis, Shortness Of Breath and  Other (See Comments)  . Tessalon [Benzonatate] Anaphylaxis  . Levaquin [Levofloxacin In D5w] Swelling    Hand and forearm swelling  . Shellfish Allergy Itching and Other (See Comments)    Took benadryl to alleviate reaction  . Miripirium Rash and Other (See Comments)    Change in mental status   Follow-up Information    [facility] .   Specialty:  [place] Why:  [facility] services for home health RN arranged Contact information: [address] [place] [address] [phone]        Inc[facility] - Dme [facility] .   Why:  Arrangements for home DME: IV HOME INFUSION/ MEDICATION Contact information: [place] [address] [phone]        [name], MD. Schedule an appointment as soon as possible for a visit in 2 week(s).   Specialty:  Family Medicine Contact information: [place] [address] [phone]           The results of significant diagnostics from this hospitalization (including imaging, microbiology, ancillary and laboratory) are listed below for reference.    Significant Diagnostic Studies: Ct Head Wo Contrast  Result Date: 07/24/2016 CLINICAL DATA:  Initial evaluation for acute altered mental status. EXAM: CT HEAD WITHOUT CONTRAST TECHNIQUE: Contiguous axial images were obtained from the base of the skull through the vertex without intravenous contrast. COMPARISON:  Prior CT from 07/11/2015. FINDINGS: Brain: Cerebral volume within normal limits. No acute intracranial hemorrhage. No evidence for acute large vessel territory infarct. No mass lesion, midline shift or mass effect. No hydrocephalus. No extra-axial fluid collection. Vascular: No hyperdense vessel. Skull: Scalp soft tissues demonstrate no acute abnormality. Calvarium intact. Sinuses/Orbits: Globes and orbital soft tissues within normal limits. Mild scattered mucosal thickening within the sphenoid sinuses.  Paranasal sinuses are otherwise clear. No mastoid effusion. IMPRESSION: No acute intracranial process identified. Electronically Signed   By: Jeannine Boga M.D.   On: 07/24/2016 05:39   Mr Pelvis W Wo Contrast  Result Date: 07/05/2016 CLINICAL DATA:  Ruling out osteomyelitis. Multiple decubitus ulcers on posterior portion of pelvic  area. Quadriplegic. EXAM: MRI PELVIS WITHOUT AND WITH CONTRAST TECHNIQUE: Multiplanar multisequence MR imaging of the pelvis was performed both before and after administration of intravenous contrast. CONTRAST:  54m MULTIHANCE GADOBENATE DIMEGLUMINE 529 MG/ML IV SOLN COMPARISON:  04/28/2015 FINDINGS: Bones/Joint/Cartilage Decubitus ulcers over the sacrum ischial tuberosities bilaterally. Prior ischial tuberosity debridement. No marrow signal abnormality in the left ischial tuberosity. No marrow edema or bone destruction of the right ischial tuberosity. Mild marrow edema in the right posterior acetabulum with enhancement which may be reactive versus secondary to mild osteomyelitis. The sacrum distal to S2 is surgically absent. No cortical destruction or bone marrow edema within the remainder of the sacrum. No hip fracture, dislocation or avascular necrosis. Sacroiliac joints are normal. Mild chronic L5 vertebral body height loss. Mild bone marrow edema in the inferior endplate of L4 incompletely visualized and characterized. No hip joint effusion.  No SI joint effusion. Muscles and Tendons Diffuse muscle edema throughout the flexor, extensor, adductor and gluteal musculature which may be neurogenic given the patient's history of quadriplegia versus secondary to diffuse mild myositis which is considered less likely. Soft tissue No fluid collection or hematoma. No soft tissue mass. Suprapubic catheter is noted. IMPRESSION: 1. Decubitus ulcers over the sacrum ischial tuberosities bilaterally. Prior ischial tuberosity debridement bilaterally without acute osteomyelitis. Mild marrow  edema in the right posterior acetabulum with enhancement which may be reactive versus secondary to mild osteomyelitis. 2. The sacrum distal to S2 is surgically absent. No cortical destruction or bone marrow edema within the remainder of the sacrum. 3. No fluid collection to suggest an abscess. Electronically Signed   By: HKathreen Devoid  On: 07/05/2016 12:14   Ir Cm Inj Any Colonic Tube W/fluoro  Result Date: 07/11/2016 CLINICAL DATA:  Current G tube malfunctioning. Concern of possible retraction. EXAM: GASTROSTOMY CATHETER INJECTION UNDER FLUOROSCOPY TECHNIQUE: The procedure, risks (including but not limited to bleeding, infection, organ damage ), benefits, and alternatives were explained to the patient. Questions regarding the procedure were encouraged and answered. The patient understands and consents to the procedure. Survey fluoroscopic inspection reveals stable position of the gastrostomy catheter. Injection demonstrates patency of the gastrostomy catheter, flow into the gastric lumen. Some incompletely occlusive filling defects are noted in the tubing. The tube was cleared of debris with specialized brush. The external bumper was repositioned appropriately. Catheter was flushed and capped. The patient tolerated the procedure well. IMPRESSION: 1. Gastrostomy catheter is in good position, okay for routine use. Electronically Signed   By: DLucrezia EuropeM.D.   On: 07/11/2016 12:52   Dg Chest Port 1 View  Result Date: 07/24/2016 CLINICAL DATA:  Acute onset of altered mental status. Shortness of breath. Initial encounter. EXAM: PORTABLE CHEST 1 VIEW COMPARISON:  Chest radiograph performed 04/19/2016 FINDINGS: The patient's tracheostomy tube is seen ending 4 cm above the carina. Right basilar airspace opacity raises concern for pneumonia. Small bilateral pleural effusions are suspected. No pneumothorax is seen. The cardiomediastinal silhouette is borderline normal in size. No acute osseous abnormalities are  identified. IMPRESSION: Right basilar airspace opacity is concerning for pneumonia. Suspect small bilateral pleural effusions. Electronically Signed   By: JGarald BaldingM.D.   On: 07/24/2016 02:09    Microbiology: Recent Results (from the past 240 hour(s))  Urine culture     Status: Abnormal   Collection Time: 07/24/16  1:35 AM  Result Value Ref Range Status   Specimen Description URINE, CLEAN CATCH  Final   Special Requests NONE  Final  Culture MULTIPLE SPECIES PRESENT, SUGGEST RECOLLECTION (A)  Final   Report Status 07/26/2016 FINAL  Final  Blood culture (routine x 2)     Status: Abnormal   Collection Time: 07/24/16  2:00 AM  Result Value Ref Range Status   Specimen Description BLOOD RIGHT HAND  Final   Special Requests IN PEDIATRIC BOTTLE 4ML  Final   Culture  Setup Time   Final    AEROBIC BOTTLE ONLY GRAM POSITIVE COCCI IN PAIRS CRITICAL RESULT CALLED TO, READ BACK BY AND VERIFIED WITH: A. MAYER, [place] ON 07/24/16 BY C. JESSUP, MLT. GRAM NEGATIVE RODS CRITICAL RESULT CALLED TO, READ BACK BY AND VERIFIED WITH: Karlene Einstein PHARMD, AT 1159 07/25/16    Culture (A)  Final    GROUP B STREP(S.AGALACTIAE)ISOLATED PROTEUS MIRABILIS    Report Status 07/26/2016 FINAL  Final   Organism ID, Bacteria GROUP B STREP(S.AGALACTIAE)ISOLATED  Final   Organism ID, Bacteria PROTEUS MIRABILIS  Final      Susceptibility   Group b strep(s.agalactiae)isolated - MIC*    CLINDAMYCIN <=0.25 SENSITIVE Sensitive     AMPICILLIN <=0.25 SENSITIVE Sensitive     ERYTHROMYCIN 4 RESISTANT Resistant     VANCOMYCIN 0.5 SENSITIVE Sensitive     CEFTRIAXONE <=0.12 SENSITIVE Sensitive     LEVOFLOXACIN 0.5 SENSITIVE Sensitive     * GROUP B STREP(S.AGALACTIAE)ISOLATED   Proteus mirabilis - MIC*    AMPICILLIN <=2 SENSITIVE Sensitive     CEFAZOLIN 8 SENSITIVE Sensitive     CEFEPIME <=1 SENSITIVE Sensitive     CEFTAZIDIME <=1 SENSITIVE Sensitive     CEFTRIAXONE <=1 SENSITIVE Sensitive     CIPROFLOXACIN  <=0.25 SENSITIVE Sensitive     GENTAMICIN <=1 SENSITIVE Sensitive     IMIPENEM 2 SENSITIVE Sensitive     TRIMETH/SULFA <=20 SENSITIVE Sensitive     AMPICILLIN/SULBACTAM <=2 SENSITIVE Sensitive     PIP/TAZO <=4 SENSITIVE Sensitive     * PROTEUS MIRABILIS  Blood Culture ID Panel (Reflexed)     Status: Abnormal   Collection Time: 07/24/16  2:00 AM  Result Value Ref Range Status   Enterococcus species NOT DETECTED NOT DETECTED Final   Listeria monocytogenes NOT DETECTED NOT DETECTED Final   Staphylococcus species NOT DETECTED NOT DETECTED Final   Staphylococcus aureus NOT DETECTED NOT DETECTED Final   Streptococcus species DETECTED (A) NOT DETECTED Final    Comment: CRITICAL RESULT CALLED TO, READ BACK BY AND VERIFIED WITH: A. MAYER, [place] ON 07/24/16 BY C. JESSUP, MLT.    Streptococcus agalactiae DETECTED (A) NOT DETECTED Final    Comment: CRITICAL RESULT CALLED TO, READ BACK BY AND VERIFIED WITH: A. MAYER, [place] ON 07/24/16 BY C. JESSUP, MLT.    Streptococcus pneumoniae NOT DETECTED NOT DETECTED Final   Streptococcus pyogenes NOT DETECTED NOT DETECTED Final   Acinetobacter baumannii NOT DETECTED NOT DETECTED Final   Enterobacteriaceae species NOT DETECTED NOT DETECTED Final   Enterobacter cloacae complex NOT DETECTED NOT DETECTED Final   Escherichia coli NOT DETECTED NOT DETECTED Final   Klebsiella oxytoca NOT DETECTED NOT DETECTED Final   Klebsiella pneumoniae NOT DETECTED NOT DETECTED Final   Proteus species NOT DETECTED NOT DETECTED Final   Serratia marcescens NOT DETECTED NOT DETECTED Final   Haemophilus influenzae NOT DETECTED NOT DETECTED Final   Neisseria meningitidis NOT DETECTED NOT DETECTED Final   Pseudomonas aeruginosa NOT DETECTED NOT DETECTED Final   Candida albicans NOT DETECTED NOT DETECTED Final   Candida glabrata NOT DETECTED NOT  DETECTED Final   Candida krusei NOT DETECTED NOT DETECTED Final   Candida parapsilosis NOT DETECTED NOT DETECTED  Final   Candida tropicalis NOT DETECTED NOT DETECTED Final  Blood culture (routine x 2)     Status: Abnormal   Collection Time: 07/24/16  4:05 AM  Result Value Ref Range Status   Specimen Description BLOOD RIGHT FOREARM  Final   Special Requests AEROBIC BOTTLE ONLY 5ML  Final   Culture  Setup Time   Final    AEROBIC BOTTLE ONLY GRAM POSITIVE COCCI IN PAIRS CRITICAL VALUE NOTED.  VALUE IS CONSISTENT WITH PREVIOUSLY REPORTED AND CALLED VALUE. GRAM NEGATIVE RODS    Culture (A)  Final    GROUP B STREP(S.AGALACTIAE)ISOLATED PROTEUS MIRABILIS SUSCEPTIBILITIES PERFORMED ON PREVIOUS CULTURE WITHIN THE LAST 5 DAYS.    Report Status 07/26/2016 FINAL  Final  Culture, respiratory (NON-Expectorated)     Status: None   Collection Time: 07/24/16 12:25 PM  Result Value Ref Range Status   Specimen Description TRACHEAL ASPIRATE  Final   Special Requests Normal  Final   Gram Stain   Final    MODERATE WBC PRESENT, PREDOMINANTLY PMN FEW GRAM POSITIVE COCCI IN PAIRS IN CLUSTERS RARE GRAM NEGATIVE COCCOBACILLI RARE GRAM NEGATIVE RODS RARE GRAM POSITIVE RODS    Culture MULTIPLE ORGANISMS PRESENT, NONE PREDOMINANT  Final   Report Status 07/26/2016 FINAL  Final  MRSA PCR Screening     Status: Abnormal   Collection Time: 07/25/16  6:38 PM  Result Value Ref Range Status   MRSA by PCR POSITIVE (A) NEGATIVE Final    Comment:        The GeneXpert MRSA Assay (FDA approved for NASAL specimens only), is one component of a comprehensive MRSA colonization surveillance program. It is not intended to diagnose MRSA infection nor to guide or monitor treatment for MRSA infections. CRITICAL RESULT CALLED TO, READ BACK BY AND VERIFIED WITH: TJasper Riling, RN AT 2150 ON 07/25/16 BY C. JESSUP, MLT.   Culture, blood (routine x 2)     Status: Abnormal (Preliminary result)   Collection Time: 07/26/16  6:15 AM  Result Value Ref Range Status   Specimen Description BLOOD LEFT ANTECUBITAL  Final   Special Requests  IN PEDIATRIC BOTTLE  4CC  Final   Culture  Setup Time   Final    GRAM NEGATIVE RODS IN PEDIATRIC BOTTLE CRITICAL RESULT CALLED TO, READ BACK BY AND VERIFIED WITH: PHARMD A.MASTERS [id number]  MLM    Culture ESCHERICHIA COLI SUSCEPTIBILITIES TO FOLLOW  (A)  Final   Report Status PENDING  Incomplete  Blood Culture ID Panel (Reflexed)     Status: Abnormal   Collection Time: 07/26/16  6:15 AM  Result Value Ref Range Status   Enterococcus species NOT DETECTED NOT DETECTED Final   Listeria monocytogenes NOT DETECTED NOT DETECTED Final   Staphylococcus species NOT DETECTED NOT DETECTED Final   Staphylococcus aureus NOT DETECTED NOT DETECTED Final   Streptococcus species NOT DETECTED NOT DETECTED Final   Streptococcus agalactiae NOT DETECTED NOT DETECTED Final   Streptococcus pneumoniae NOT DETECTED NOT DETECTED Final   Streptococcus pyogenes NOT DETECTED NOT DETECTED Final   Acinetobacter baumannii NOT DETECTED NOT DETECTED Final   Enterobacteriaceae species DETECTED (A) NOT DETECTED Final    Comment: CRITICAL RESULT CALLED TO, READ BACK BY AND VERIFIED WITH: PHARMD A.MASTERS [id number] MLM    Enterobacter cloacae complex NOT DETECTED NOT DETECTED Final   Escherichia coli DETECTED (A) NOT DETECTED Final  Comment: CRITICAL RESULT CALLED TO, READ BACK BY AND VERIFIED WITH: PHARMD A.MASTERS [id number] MLM    Klebsiella oxytoca NOT DETECTED NOT DETECTED Final   Klebsiella pneumoniae NOT DETECTED NOT DETECTED Final   Proteus species NOT DETECTED NOT DETECTED Final   Serratia marcescens NOT DETECTED NOT DETECTED Final   Carbapenem resistance NOT DETECTED NOT DETECTED Final   Haemophilus influenzae NOT DETECTED NOT DETECTED Final   Neisseria meningitidis NOT DETECTED NOT DETECTED Final   Pseudomonas aeruginosa NOT DETECTED NOT DETECTED Final   Candida albicans NOT DETECTED NOT DETECTED Final   Candida glabrata NOT DETECTED NOT DETECTED Final   Candida krusei NOT DETECTED NOT DETECTED  Final   Candida parapsilosis NOT DETECTED NOT DETECTED Final   Candida tropicalis NOT DETECTED NOT DETECTED Final  Culture, blood (routine x 2)     Status: None   Collection Time: 07/26/16  6:23 AM  Result Value Ref Range Status   Specimen Description BLOOD LEFT ANTECUBITAL  Final   Special Requests IN PEDIATRIC BOTTLE  3CC  Final   Culture NO GROWTH 5 DAYS  Final   Report Status 07/31/2016 FINAL  Final  Culture, blood (routine x 2)     Status: None (Preliminary result)   Collection Time: 07/30/16  9:44 AM  Result Value Ref Range Status   Specimen Description BLOOD LEFT ANTECUBITAL  Final   Special Requests IN PEDIATRIC BOTTLE  3CC  Final   Culture NO GROWTH < 24 HOURS  Final   Report Status PENDING  Incomplete  Culture, blood (routine x 2)     Status: None (Preliminary result)   Collection Time: 07/30/16  9:47 AM  Result Value Ref Range Status   Specimen Description BLOOD LEFT HAND  Final   Special Requests IN PEDIATRIC BOTTLE  3CC  Final   Culture NO GROWTH < 24 HOURS  Final   Report Status PENDING  Incomplete     Labs: Basic Metabolic Panel:  Recent Labs Lab 07/25/16 1240 07/26/16 0612 07/31/16 0426  NA 133* 133* 133*  K 4.2 5.1 4.4  CL 108 108 101  CO2 19* 20* 25  GLUCOSE 116* 221* 123*  BUN 22* 24* 15  CREATININE 0.64 0.61 0.55*  CALCIUM 8.4* 7.8* 8.2*   CBC:  Recent Labs Lab 07/25/16 1240  07/27/16 0742 07/28/16 0623 07/29/16 0554 07/30/16 0400 07/31/16 0426  WBC 33.0*  < > 27.5* 31.2* 29.3* 35.1* 35.4*  NEUTROABS 28.7*  --   --   --   --   --   --   HGB 7.9*  < > 8.0* 7.9* 7.7* 7.4* 7.3*  HCT 24.7*  < > 25.9* 25.4* 25.1* 23.9* 23.9*  MCV 80.7  < > 83.3 82.2 83.4 84.5 85.7  PLT 847*  < > 907* 766* 756* 656* 606*  < > = values in this interval not displayed.  CBG:  Recent Labs Lab 07/30/16 1234 07/30/16 1718 07/30/16 2138 07/31/16 0749 07/31/16 1140  GLUCAP 114* 140* 90 93 127*    Signed:  Barton Dubois MD.  [facility] 07/31/2016, 5:15 PM

## 2016-07-31 NOTE — Progress Notes (Signed)
Nutrition Follow-up  DOCUMENTATION CODES:   Not applicable  INTERVENTION:    Continue TF regimen as PTA: Glucerna 1.2 at 70 ml/h from 12 midnight to 12 noon every day. This provides ~50% of estimated needs.  NUTRITION DIAGNOSIS:   Increased nutrient needs related to wound healing as evidenced by estimated needs.  Ongoing  GOAL:   Patient will meet greater than or equal to 90% of their needs  Progressing  MONITOR:   Labs, Weight trends, TF tolerance, Skin, I & O's  REASON FOR ASSESSMENT:   Consult Enteral/tube feeding initiation and management  ASSESSMENT:   30 y.o. 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   Pt transferred from SDU to medical floor on 07/26/16.   Patient continues to receive Glucerna 1.2 via PEG at 70 ml/h x 12 hours per day to provide 1008 kcals, 50 gm protein, 676 ml free water daily. Free water flushes 300-400 ml every 8 hours. However, noted pt refused TF the last 2 days due to eating well.  Per IR note, leaking g-tube was evaluated. Leaking may be due to phalange not at skin level; once phalange was pushed back down tube functioned without issue.   Reviewed CWOCN note from 07/30/16; pt with wound vac to lt and rt ischium wounds and sacrum (chronic stage IV pressure injuries). Dressing last changed on 07/30/16.   Pt continues to eat well; noted meal completion 25-75%.   Pt will need IV antibiotics at d/c; [facility] following  Labs reviewed: Na: 133, CBGS: 90-140.   Diet Order:  Diet regular Room service appropriate? Yes; Fluid consistency: Thin  Skin:  Wound (see comment) (stage IV to sacrum &  ischial tuberosity)  Last BM:  07/31/16  Height:   Ht Readings from Last 1 Encounters:  07/24/16 5\' 8"  (1.727 m)    Weight:   Wt Readings from Last 1 Encounters:  07/26/16 135 lb (61.2 kg)    Ideal Body Weight:  59.5 kg  BMI:  Body mass index is 20.53 kg/m.  Estimated Nutritional Needs:   Kcal:  1750-2000  Protein:  90-105 gm  Fluid:  2 L  EDUCATION NEEDS:   No education needs identified at this time  Jason [name] A. Jason [name], RD, LDN, CDE Pager: [phone] After hours Pager: [phone]

## 2016-07-31 NOTE — Progress Notes (Signed)
Referring Physician(s): Dr. Barton Dubois  Supervising Physician: Arne Cleveland  Patient Status: [facility] - In-pt  Chief Complaint: Leaking g-tube  Subjective: Patient has a history of quadriplegia with a trach and g-tube in place. His 55F pull through g-tube was placed on 05/26/15 by IR.  He has had no major issues with his tube until about 3 weeks ago his mom noticed the tube was hanging out further than she remembered it being.  Due to a concern for dislodgement, he was brought in for an injection on 10/4.  This confirmed placement.  He has since been admitted for bacteremia.  Mom states that his g-tube is leaking some when he is getting feedings.  These are intermittent as he is also intermittently eating orally on his own as well.  We have been asked to see him for evaluation.  Mom states the phalange does not always stay down.  Allergies: Amikacin sulfate; Cefuroxime axetil; Ertapenem; Morphine and related; Nsaids; Penicillins; Sulfa antibiotics; Tessalon [benzonatate]; Levaquin [levofloxacin in d5w]; Shellfish allergy; and Miripirium  Medications: Prior to Admission medications   Medication Sig Start Date End Date Taking? Authorizing Provider  acetaminophen (TYLENOL) 325 MG tablet Take 2 tablets (650 mg total) by mouth every 6 (six) hours as needed for mild pain, moderate pain, fever or headache. 01/14/15  Yes Jessica U Vann, DO  albuterol (PROVENTIL) (2.5 MG/3ML) 0.083% nebulizer solution Take 3 mLs (2.5 mg total) by nebulization 4 (four) times daily. Patient taking differently: Take 2.5 mg by nebulization See admin instructions. Two to four times a day 07/03/16  Yes [name], MD  baclofen (LIORESAL) 20 MG tablet Take 1 tablet (20 mg total) by mouth 4 (four) times daily. 06/26/16  Yes [name], MD  BISACODYL 5 MG EC tablet Take ONE (1) tablet by mouth twice daily as needed FOR MODERATE CONSTIPATION 06/19/16  Yes [name], MD  buPROPion (WELLBUTRIN XL) 150 MG 24 hr tablet  Take 1 tablet (150 mg total) by mouth daily. 06/15/16  Yes [name], MD  camphor-menthol [facility]) lotion Apply topically as needed for itching. 09/02/15  Yes Shanker Kristeen Mans, MD  dexlansoprazole (DEXILANT) 60 MG capsule Take 1 capsule (60 mg total) by mouth daily. 03/14/16  Yes [name], MD  docusate (COLACE) 50 MG/5ML liquid Take 10 mLs (100 mg total) by mouth 2 (two) times daily. Patient taking differently: Take 100 mg by mouth daily.  11/10/15  Yes [name], MD  doxycycline (VIBRA-TABS) 100 MG tablet Take 1 tablet (100 mg total) by mouth 2 (two) times daily. Patient taking differently: Take 100 mg by mouth every evening.  02/07/16  Yes [name], MD  fentaNYL (DURAGESIC - DOSED MCG/HR) 75 MCG/HR place 1 patch (65mcg total) onto the skin every 3 (three) days 07/11/16  Yes [name], MD  ferrous sulfate 325 (65 FE) MG tablet Take 1 tablet (325 mg total) by mouth daily with breakfast. 09/27/15  Yes [name], MD  glucagon (GLUCAGON EMERGENCY) 1 MG injection INJECT INTO MUSCLE ONCE AS NEEDED Patient taking differently: Inject 1 mg into the muscle once as needed (hypoglycemia). INJECT INTO MUSCLE ONCE AS NEEDED 07/05/15  Yes Renato Shin, MD  GLUCERNA (GLUCERNA) LIQD 1,000 mLs by PEG Tube route continuous. 70 ml/hr   Yes Historical Provider, MD  hydrocortisone (CORTEF) 10 MG tablet Take 1 tablet (10 mg total) by mouth every evening. 02/13/16  Yes [name], MD  hydrocortisone (CORTEF) 20  MG tablet Take 1 tablet (20 mg total) by mouth every morning. 02/13/16  Yes [name], MD  HYDROmorphone (DILAUDID) 8 MG tablet Take ONE (1) tablet (8mg  total) by mouth every 6 hours as needed for severe pain Patient taking differently: Take 8 mg by mouth every 6 (six) hours as needed for severe pain.  07/11/16  Yes [name], MD  insulin aspart (NOVOLOG FLEXPEN) 100 UNIT/ML FlexPen Inject 0-10 Units into the skin 4 (four) times daily as needed for high blood sugar (CBG >200). Per sliding  scale: CBG <200 = 0 units, 201-300 = 4 units, 301-400 = 6 units, >400 = 10 units   Yes Historical Provider, MD  insulin glargine (LANTUS) 100 UNIT/ML injection Inject 0.15 mLs (15 Units total) into the skin at bedtime. 08/16/15  Yes Janece Canterbury, MD  loratadine (CLARITIN) 10 MG tablet Take 1 tablet (10 mg total) by mouth daily. 11/25/15  Yes [name], MD  LORazepam (ATIVAN) 0.5 MG tablet Take 1 tablet (0.5 mg total) by mouth 3 (three) times daily as needed for anxiety. Reported on 11/02/2015 Patient taking differently: Take 0.25-0.5 mg by mouth 3 (three) times daily as needed for anxiety. Reported on 11/02/2015 06/20/16  Yes [name], MD  LYRICA 100 MG capsule Take ONE (1)  capsule by mouth three times daily (morning,afternoon and evening) and 2 capsules at bedtime Patient taking differently: Take 100 mg by mouth three times a day (morning/afternoon/evening) and 200 mg at bedtime 05/25/16  Yes [name], MD  Magnesium Oxide 400 (240 Mg) MG TABS Take 400 mg by mouth 2 (two) times daily. 07/09/16  Yes Historical Provider, MD  midodrine (PROAMATINE) 10 MG tablet Take 2 tablets (20 mg total) by mouth 3 (three) times daily with meals. Patient taking differently: Take 40 mg by mouth daily as needed.  10/28/15  Yes [name], MD  Multiple Vitamin (MULTIVITAMIN WITH MINERALS) TABS tablet Place 1 tablet into feeding tube daily. 06/01/15  Yes Erick Colace, NP  mupirocin ointment (BACTROBAN) 2 % Apply to affected area twice a day as needed 06/19/16  Yes [name], MD  ondansetron (ZOFRAN ODT) 8 MG disintegrating tablet Place 1 tablet (8 mg total) into feeding tube every 8 (eight) hours as needed for nausea or vomiting. 06/01/15  Yes Erick Colace, NP  Oxycodone HCl 20 MG TABS Take 1 tablet (20 mg total) by mouth every 6 (six) hours as needed (pain). 07/11/16  Yes [name], MD  UNABLE TO FIND AQUACEL: Apply to feet and toes daily   Yes Historical Provider, MD  VOLTAREN 1 % GEL apply 2 grams  topically twice a day Patient taking differently: Apply 2 grams topically twice a day as needed for shoulder pain 04/24/16  Yes [name], MD  Water For Irrigation, Sterile (FREE WATER) SOLN Place 200 mLs into feeding tube every 8 (eight) hours. Patient taking differently: Place 300-400 mLs into feeding tube every 8 (eight) hours.  10/11/15  Yes [name], MD  ciprofloxacin (CIPRO) 500 MG tablet Take 1 tablet (500 mg total) by mouth 2 (two) times daily. Patient not taking: Reported on 07/24/2016 02/14/16   [name], MD  fluconazole (DIFLUCAN) 200 MG tablet Take 1 tablet (200 mg total) by mouth daily. Patient not taking: Reported on 07/24/2016 01/26/16   Cherene Altes, MD  Magnesium Oxide 400 MG CAPS Take 1 capsule (400 mg total) by mouth 2 (two) times daily.  Patient not taking: Reported on 07/24/2016 09/27/15   [name], MD  nitrofurantoin, macrocrystal-monohydrate, (MACROBID) 100 MG capsule Take 1 capsule (100 mg total) by mouth 2 (two) times daily. Patient not taking: Reported on 07/24/2016 06/20/16   [name], MD  pantoprazole sodium (PROTONIX) 40 mg/20 mL PACK Place 20 mLs (40 mg total) into feeding tube daily. Patient not taking: Reported on 07/24/2016 06/01/15   Erick Colace, NP  saccharomyces boulardii (FLORASTOR) 250 MG capsule Take 1 capsule (250 mg total) by mouth 2 (two) times daily. Patient not taking: Reported on 07/24/2016 02/20/16   [name], MD    Vital Signs: BP 112/73 (BP Location: Left Arm)   Pulse 87   Temp 99.8 F (37.7 C) (Oral)   Resp 18   Ht 5\' 8"  (1.727 m)   Wt 135 lb (61.2 kg)   SpO2 99%   BMI 20.53 kg/m   Physical Exam: Abd: g-tube with phalange about 5cm away from the abdominal wall with a fair amount of movement of the tube.  No current leaking as his TFs have been held.  The bulky gauze was removed and the phalange was secured down to the skin level and a piece of tape was wrapped around the tube to keep it in place.  Once this  was done, his g-tube was flushed several times with no leaking.  No evidence of infection.  Imaging: No results found.  Labs:  CBC:  Recent Labs  07/28/16 0623 07/29/16 0554 07/30/16 0400 07/31/16 0426  WBC 31.2* 29.3* 35.1* 35.4*  HGB 7.9* 7.7* 7.4* 7.3*  HCT 25.4* 25.1* 23.9* 23.9*  PLT 766* 756* 656* 606*    COAGS: No results for input(s): INR, APTT in the last 8760 hours.  BMP:  Recent Labs  07/24/16 0116 07/24/16 0209 07/25/16 1240 07/26/16 0612 07/31/16 0426  NA 127* 129* 133* 133* 133*  K 4.4 4.5 4.2 5.1 4.4  CL 100* 100* 108 108 101  CO2 17*  --  19* 20* 25  GLUCOSE 160* 157* 116* 221* 123*  BUN 28* 30* 22* 24* 15  CALCIUM 8.4*  --  8.4* 7.8* 8.2*  CREATININE 1.07 1.00 0.64 0.61 0.55*  GFRNONAA >60  --  >60 >60 >60  GFRAA >60  --  >60 >60 >60    LIVER FUNCTION TESTS:  Recent Labs  01/23/16 0332 01/24/16 0327 06/18/16 1145 07/24/16 0116  BILITOT 0.4 0.4 0.2 0.7  AST 17 18 13 17   ALT 18 21 9  16*  ALKPHOS 114 111 144* 173*  PROT 7.3 7.0 6.8 6.7  ALBUMIN 2.5* 2.4* 2.8* 1.7*    Assessment and Plan: 1. Leaking g-tube -it appears some of the leaking may be because the phalange was not at skin level allowing the tube to slide in and out which can lead to leaking problems. Once the phalange was pushed back down, there was no issue with leaking with multiple flushes.  Mom states that it is usually with his long-term feedings that this is a problem.  I have spoken with Dr. Dyann Kief to ask for his TFs to be restarted to see if this correction fixed the leaking issues; however, this is not his normally scheduled feeding time.  Without restarting the TFs it is difficult to determine if the problem is resolved or not.  He is otherwise, apparently, ready for discharge.  I have suggested if this is the case, that he could return home and if leaking persists once his  TFs are resumed, then his mother could contact us and we could get him set up as an outpatient for  an exchange vs upsize if felt to be warranted.  Dr. Dyann Kief will speak to his mother and determine a plan.  Electronically Signed: Henreitta Cea 07/31/2016, 8:50 AM   I spent a total of 25 Minutes at the the patient's bedside AND on the patient's hospital floor or unit, greater than 50% of which was counseling/coordinating care for leaking g-tube

## 2016-07-31 NOTE — Progress Notes (Signed)
[place] for Infectious Disease    Date of Admission:  07/24/2016   Total days of antibiotics 3      ID: RYLEI SAAH is a 30 y.o. 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  Principal Problem:   Bacteremia due to group B Streptococcus Active Problems:   SIRS (systemic inflammatory response syndrome) ([name])   Suprapubic catheter ([name])   History of MDR Pseudomonas aeruginosa infection   Lower urinary tract infectious disease   Hyponatremia   Diabetes mellitus type 1 with complications ([name])   Fibromyalgia   Decubitus ulcer   Hypotension   HCAP (healthcare-associated pneumonia)   Encephalopathy acute   Osteomyelitis ([name])   Functional quadriplegia ([name])   Adrenal insufficiency ([name])   GERD (gastroesophageal reflux disease)   PEG tube malfunction ([name])  Subjective: Patient was seen and examined this morning. Patient denies any fever, chills. He is ready to go home.  Medications:  . albuterol  2.5 mg Nebulization BID  . baclofen  20 mg Oral QID  . buPROPion  150 mg Oral Daily  . ceFEPime (MAXIPIME) IV  2 g Intravenous Q12H  . docusate  100 mg Oral Daily  . famotidine (PEPCID) IV  20 mg Intravenous Q12H  . feeding supplement (GLUCERNA 1.2 CAL)  1,000 mL Per Tube Daily  . fentaNYL  75 mcg Transdermal Q72H  . free water  300-400 mL Per Tube Q8H  . heparin  5,000 Units Subcutaneous Q8H  . insulin aspart  0-15 Units Subcutaneous TID WC  . insulin aspart  0-5 Units Subcutaneous QHS  . insulin glargine  22 Units Subcutaneous QHS  . magnesium oxide  400 mg Oral BID  . methylPREDNISolone (SOLU-MEDROL) injection  20 mg Intravenous Q12H  . oxybutynin  5 mg Per Tube BID  .  pregabalin  100 mg Oral TID  . sodium chloride flush  10-40 mL Intracatheter Q12H  . sodium chloride flush  3 mL Intravenous Q12H    Objective: Vitals:   07/30/16 2140 07/31/16 0056 07/31/16 0445 07/31/16 0513  BP: 117/77   112/73  Pulse: 94   87  Resp: 18   18  Temp: 99.8 F (37.7 C)   99.8 F (37.7 C)  TempSrc: Oral   Oral  SpO2: 99% 99% 99% 99%  Weight:      Height:       General: Vital signs reviewed.  Patient is in no acute distress and cooperative with exam.  Neck: Trach in place. Cardiovascular: RRR Pulmonary/Chest: CTA b/l. No rhonchi, wheezing or rales.  Abdominal: Soft, non-tender, non-distended, BS +.   Lab Results  Recent Labs  07/30/16 0400 07/31/16 0426  WBC 35.1* 35.4*  HGB 7.4* 7.3*  HCT 23.9* 23.9*  NA  --  133*  K  --  4.4  CL  --  101  CO2  --  25  BUN  --  15  CREATININE  --  0.55*   Microbiology: UCx 10/17: Multiple species BCx 10/17: Strep Agalactiae and Proteus  Respiratory Cx 10/16: few gram positive cocci in pairs in clusters BCx 10/19 >> E. Coli in 1/2  BCx 10/23 >> pending  Current antibiotics: Vancomycin 10/17>> 10/17 Imipenem-Cilastatin 10/17 >> 10/19 Cefepime 10/17 >> 10/17; 10/19 >>  Assessment/Plan: Mr.  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  Polymicrobial Bacteremia 2/2 UTI:  Afebrile, WBC stable at 35.1>35.4 this morning. Bacteremia likely secondary to underlying UTI. Repeat BCx were positive for E. Coli in 1/2 BCx from  10/18, possible due to suprapubic catheter change at that time. Patient was transitioned to Cefepime on 10/19 and will need at least a 2 week course.  -Continue Cefepime and pre-medicate; will need 2 week course once BCx negative -Wound Care Following  -Stable to be discharged home from ID standpoint, [place] to check CBC -BCx 10/23 >> pending  Martyn Malay, DO PGY-3 Internal Medicine Resident Pager # [phone] 07/31/2016 8:54 AM

## 2016-08-01 ENCOUNTER — Ambulatory Visit: Payer: Medicaid Other | Admitting: Infectious Disease

## 2016-08-01 DIAGNOSIS — K9423 Gastrostomy malfunction: Secondary | ICD-10-CM

## 2016-08-01 DIAGNOSIS — E2740 Unspecified adrenocortical insufficiency: Secondary | ICD-10-CM

## 2016-08-01 DIAGNOSIS — R7881 Bacteremia: Secondary | ICD-10-CM

## 2016-08-01 DIAGNOSIS — L89004 Pressure ulcer of unspecified elbow, stage 4: Secondary | ICD-10-CM

## 2016-08-01 DIAGNOSIS — E108 Type 1 diabetes mellitus with unspecified complications: Secondary | ICD-10-CM

## 2016-08-01 DIAGNOSIS — R532 Functional quadriplegia: Secondary | ICD-10-CM

## 2016-08-01 DIAGNOSIS — R6510 Systemic inflammatory response syndrome (SIRS) of non-infectious origin without acute organ dysfunction: Secondary | ICD-10-CM

## 2016-08-01 DIAGNOSIS — G9340 Encephalopathy, unspecified: Secondary | ICD-10-CM

## 2016-08-01 LAB — GLUCOSE, CAPILLARY
GLUCOSE-CAPILLARY: 79 mg/dL (ref 65–99)
GLUCOSE-CAPILLARY: 111 mg/dL — AB (ref 65–99)

## 2016-08-01 LAB — CULTURE, BLOOD (ROUTINE X 2)

## 2016-08-01 MED ORDER — ALUM & MAG HYDROXIDE-SIMETH 200-200-20 MG/5ML PO SUSP
15.0000 mL | Freq: Once | ORAL | Status: AC
  Administered 2016-08-01: 15 mL
  Filled 2016-08-01: qty 30

## 2016-08-01 MED ORDER — HEPARIN SOD (PORK) LOCK FLUSH 100 UNIT/ML IV SOLN
250.0000 [IU] | INTRAVENOUS | Status: AC | PRN
  Administered 2016-08-01: 250 [IU]

## 2016-08-01 MED FILL — FAMOTIDINE 20 MG TABLET: 20 | 30 days supply | Qty: 60 | Fill #0

## 2016-08-01 MED FILL — SOLU-MEDROL (PF) 40 MG VIAL: 40 | 10 days supply | Qty: 20 | Fill #0

## 2016-08-01 MED FILL — BD 3 ML SYRINGE WITH NEEDLE: 21G X 1" | 10 days supply | Qty: 20 | Fill #0

## 2016-08-01 NOTE — Progress Notes (Signed)
CM made CN aware [facility] @ [phone] needs to be called for transportation to home once PICC line is flushed/capped by IV nurse . Whitman Hero RN,BSN,CM [phone]

## 2016-08-01 NOTE — Consult Note (Addendum)
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 Pt tolerated dressing change without distress, applied one piece of black foam to each wound and bridged all sites together to 173mm cont suction. Skin prep applied to periwound to help seal drape.Barrier ring applied around lower wound edges to maintain a seal. Placed on home vac to prepare for impending discharge. Ostomy pouch has leaked related to large amt semiformed stool.  Applied 2 piece pouch.  Stoma red and viable, above skin level, 1 1/4 inches, small amt bleeding to peristomal area.  Intact skin surrounding stoma.One hour spent performing this consult, mother at bedside to assess wounds. Jason Girt MSN, RN, [place], [place], [place]

## 2016-08-01 NOTE — Progress Notes (Signed)
Patient ID: Jason [name], male   DOB: [birth year], 30 y.o.   MRN: [id number] Called to reassess this patient's g-tube as it is clogged per nursing.  I used a [place] syringe and flushed his g-tube with about 30 cc of warm water.  There was initially some resistance, but with a moderate pressure, his g-tube was able to be successfully flushed and opened.  Some thick fibrin like tissue was aspirated out of the tube.  The tube was then reflushed with 30 more cc of water with no resistance.  He may resume tube functions as before.  [name] E 11:31 AM 08/01/2016

## 2016-08-01 NOTE — Progress Notes (Signed)
Pt prepared for d/c to home with mom. Picc flushed for home by IV team. Skin intact except as charted in most recent assessments. Vitals are stable. Pt to be transported home by ambulance service.

## 2016-08-01 NOTE — Progress Notes (Signed)
PROGRESS NOTE                                                                                                                                                                                                             Patient Demographics:    Jason [name], is a 30 y.o. male, DOB - 06/14/1986, [id number]  Admit date - 07/24/2016   Admitting Physician Waldemar Dickens, MD  Outpatient Primary MD for the patient is [name], MD  LOS - 8    Chief Complaint  Patient presents with  . 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 Has chronic suprapubic catheter which was changed 2 weeks back.   Subjective:   G-tube clogged. IR consulted who flushed the tube with some pressure and was successfully flushed and opened    Assessment  & Plan :   Acute encephalopathy Secondary to UTI with bacteremia, chronic sacral decubitus ulcer with osteomyelitis. Blood culture positive for Proteus Escherichia coli and strep species. Plan on IV cefepime for total 2 weeks course (until 11/3) Encephalopathy resolved.  UTI associated with chronic suprapubic catheter Plan on empiric antibiotics for 2 weeks duration.  catheter exchanged on admission. Continue oxybutynin for bladder spasms.  Concern for urinary leak around urostomy site. Urology was consulted, recommended outpatient follow-up.  Sacral decubitus ulcers with osteomyelitis Wound VAC for several weeks.. To follow-up with plastic surgery not [facility].  Functional quadriplegia  Hyponatremia Improved with IV fluids  Adrenal insufficiency with chronic hypertension Stable. Resume hydrocortisone and midodrine.  Type 1 diabetes  mellitus Continue Lantus at current dose  PEG tube malfunction Evaluated by IR and reposition.     Code Status : Full code  Family Communication  : Mother at bedside  Disposition Plan  : Discharge home    Consults  :  ID IR    Lab Results  Component Value Date   PLT 606 (H) 07/31/2016    Antibiotics  :    Anti-infectives    Start     Dose/Rate Route Frequency Ordered Stop   07/31/16 0000  ceFEPIme 2 g in dextrose 5 % 50 mL  2 g 100 mL/hr over 30 Minutes Intravenous Every 12 hours 07/31/16 1638 08/10/16 2359   07/26/16 2200  ceFEPIme (MAXIPIME) 2 g in dextrose 5 % 50 mL IVPB    Comments:  Please send up with famotidine and methylprednisolone, to use for premedication   2 g 100 mL/hr over 30 Minutes Intravenous Every 12 hours 07/26/16 1729     07/26/16 1400  imipenem-cilastatin (PRIMAXIN) 500 mg in sodium chloride 0.9 % 100 mL IVPB  Status:  Discontinued     500 mg 200 mL/hr over 30 Minutes Intravenous Every 8 hours 07/26/16 1020 07/26/16 1726   07/25/16 1800  imipenem-cilastatin (PRIMAXIN) 1,000 mg in sodium chloride 0.9 % 250 mL IVPB  Status:  Discontinued     1,000 mg 250 mL/hr over 60 Minutes Intravenous Every 8 hours 07/25/16 1628 07/26/16 1020   07/25/16 1530  imipenem-cilastatin (PRIMAXIN) 500 mg in sodium chloride 0.9 % 100 mL IVPB  Status:  Discontinued     500 mg 200 mL/hr over 30 Minutes Intravenous Every 8 hours 07/25/16 1514 07/25/16 1628   07/25/16 1430  imipenem-cilastatin (PRIMAXIN) 500 mg in sodium chloride 0.9 % 100 mL IVPB  Status:  Discontinued     500 mg 200 mL/hr over 30 Minutes Intravenous Every 8 hours 07/25/16 1423 07/25/16 1514   07/25/16 1000  imipenem-cilastatin (PRIMAXIN) 500 mg in sodium chloride 0.9 % 100 mL IVPB  Status:  Discontinued     500 mg 200 mL/hr over 30 Minutes Intravenous Every 8 hours 07/25/16 0941 07/25/16 1423   07/25/16 0530  vancomycin (VANCOCIN) IVPB 750 mg/150 ml premix  Status:  Discontinued     750 mg 150  mL/hr over 60 Minutes Intravenous Every 24 hours 07/24/16 1024 07/24/16 1753   07/24/16 2200  cefTRIAXone (ROCEPHIN) 2 g in dextrose 5 % 50 mL IVPB  Status:  Discontinued     2 g 100 mL/hr over 30 Minutes Intravenous Every 24 hours 07/24/16 1755 07/24/16 2352   07/24/16 1800  imipenem-cilastatin (PRIMAXIN) 500 mg in sodium chloride 0.9 % 100 mL IVPB  Status:  Discontinued     500 mg 200 mL/hr over 30 Minutes Intravenous Every 8 hours 07/24/16 1024 07/24/16 1753   07/24/16 0930  imipenem-cilastatin (PRIMAXIN) 500 mg in sodium chloride 0.9 % 100 mL IVPB     500 mg 200 mL/hr over 30 Minutes Intravenous  Once 07/24/16 0927 07/24/16 1046   07/24/16 0300  vancomycin (VANCOCIN) IVPB 1000 mg/200 mL premix     1,000 mg 200 mL/hr over 60 Minutes Intravenous  Once 07/24/16 0249 07/24/16 0630   07/24/16 0300  ceFEPIme (MAXIPIME) 2 g in dextrose 5 % 50 mL IVPB     2 g 100 mL/hr over 30 Minutes Intravenous  Once 07/24/16 0249 07/24/16 0512        Objective:   Vitals:   07/31/16 2109 08/01/16 0616 08/01/16 0757 08/01/16 1109  BP: 117/71 (!) 90/57    Pulse: 93 87 100 99  Resp: 18 18 16 18   Temp: 98.4 F (36.9 C) 99 F (37.2 C)    TempSrc: Oral Oral    SpO2: 97% 100% 100% 100%  Weight:      Height:        Wt Readings from Last 3 Encounters:  07/26/16 61.2 kg (135 lb)  01/26/16 40.8 kg (90 lb)  01/04/16 53.7 kg (118 lb 4.8 oz)     Intake/Output Summary (Last 24 hours) at 08/01/16 1252 Last data filed at 08/01/16  0629  Gross per 24 hour  Intake             2176 ml  Output             2300 ml  Net             -124 ml     Physical Exam  Gen: Young male, trached HEENT: Moist mucosa, supple neck Chest: clear b/l, no added sounds CVS: N S1&S2, no murmurs GI: soft, nondistended, PEG tube in place, suprapubic catheter Musculoskeletal: warm, no edema, sacral intravenous ulcer CNS: Alert and oriented, quadriplegic,    Data Review:    CBC  Recent Labs Lab 07/27/16 0742  07/28/16 0623 07/29/16 0554 07/30/16 0400 07/31/16 0426  WBC 27.5* 31.2* 29.3* 35.1* 35.4*  HGB 8.0* 7.9* 7.7* 7.4* 7.3*  HCT 25.9* 25.4* 25.1* 23.9* 23.9*  PLT 907* 766* 756* 656* 606*  MCV 83.3 82.2 83.4 84.5 85.7  MCH 25.7* 25.6* 25.6* 26.1 26.2  MCHC 30.9 31.1 30.7 31.0 30.5  RDW 16.1* 15.9* 16.1* 16.5* 17.1*    Chemistries   Recent Labs Lab 07/26/16 0612 07/31/16 0426  NA 133* 133*  K 5.1 4.4  CL 108 101  CO2 20* 25  GLUCOSE 221* 123*  BUN 24* 15  CREATININE 0.61 0.55*  CALCIUM 7.8* 8.2*   ------------------------------------------------------------------------------------------------------------------ No results for input(s): CHOL, HDL, LDLCALC, TRIG, CHOLHDL, LDLDIRECT in the last 72 hours.  Lab Results  Component Value Date   HGBA1C 8.0 (H) 07/04/2016   ------------------------------------------------------------------------------------------------------------------ No results for input(s): TSH, T4TOTAL, T3FREE, THYROIDAB in the last 72 hours.  Invalid input(s): FREET3 ------------------------------------------------------------------------------------------------------------------ No results for input(s): VITAMINB12, FOLATE, FERRITIN, TIBC, IRON, RETICCTPCT in the last 72 hours.  Coagulation profile No results for input(s): INR, PROTIME in the last 168 hours.  No results for input(s): DDIMER in the last 72 hours.  Cardiac Enzymes No results for input(s): CKMB, TROPONINI, MYOGLOBIN in the last 168 hours.  Invalid input(s): CK ------------------------------------------------------------------------------------------------------------------ No results found for: BNP  Inpatient Medications  Scheduled Meds: . albuterol  2.5 mg Nebulization BID  . baclofen  20 mg Oral QID  . buPROPion  150 mg Oral Daily  . ceFEPime (MAXIPIME) IV  2 g Intravenous Q12H  . docusate  100 mg Oral Daily  . famotidine (PEPCID) IV  20 mg Intravenous Q12H  . feeding  supplement (GLUCERNA 1.2 CAL)  1,000 mL Per Tube Daily  . fentaNYL  75 mcg Transdermal Q72H  . free water  300-400 mL Per Tube Q8H  . heparin  5,000 Units Subcutaneous Q8H  . insulin aspart  0-15 Units Subcutaneous TID WC  . insulin aspart  0-5 Units Subcutaneous QHS  . insulin glargine  22 Units Subcutaneous QHS  . magnesium oxide  400 mg Oral BID  . methylPREDNISolone (SOLU-MEDROL) injection  20 mg Intravenous Q12H  . oxybutynin  5 mg Per Tube BID  . pregabalin  100 mg Oral TID  . sodium chloride flush  10-40 mL Intracatheter Q12H  . sodium chloride flush  3 mL Intravenous Q12H   Continuous Infusions: . sodium chloride 50 mL/hr at 07/31/16 1209   PRN Meds:.albuterol, bisacodyl, diclofenac sodium, HYDROmorphone, LORazepam, midodrine, ondansetron, oxyCODONE, sodium chloride flush  Micro Results Recent Results (from the past 240 hour(s))  Urine culture     Status: Abnormal   Collection Time: 07/24/16  1:35 AM  Result Value Ref Range Status   Specimen Description URINE, CLEAN CATCH  Final   Special Requests NONE  Final   Culture MULTIPLE SPECIES PRESENT, SUGGEST RECOLLECTION (A)  Final   Report Status 07/26/2016 FINAL  Final  Blood culture (routine x 2)     Status: Abnormal   Collection Time: 07/24/16  2:00 AM  Result Value Ref Range Status   Specimen Description BLOOD RIGHT HAND  Final   Special Requests IN PEDIATRIC BOTTLE 4ML  Final   Culture  Setup Time   Final    AEROBIC BOTTLE ONLY GRAM POSITIVE COCCI IN PAIRS CRITICAL RESULT CALLED TO, READ BACK BY AND VERIFIED WITH: A. MAYER, [place] ON 07/24/16 BY C. JESSUP, MLT. GRAM NEGATIVE RODS CRITICAL RESULT CALLED TO, READ BACK BY AND VERIFIED WITH: Karlene Einstein PHARMD, AT 1159 07/25/16    Culture (A)  Final    GROUP B STREP(S.AGALACTIAE)ISOLATED PROTEUS MIRABILIS    Report Status 07/26/2016 FINAL  Final   Organism ID, Bacteria GROUP B STREP(S.AGALACTIAE)ISOLATED  Final   Organism ID, Bacteria PROTEUS MIRABILIS   Final      Susceptibility   Group b strep(s.agalactiae)isolated - MIC*    CLINDAMYCIN <=0.25 SENSITIVE Sensitive     AMPICILLIN <=0.25 SENSITIVE Sensitive     ERYTHROMYCIN 4 RESISTANT Resistant     VANCOMYCIN 0.5 SENSITIVE Sensitive     CEFTRIAXONE <=0.12 SENSITIVE Sensitive     LEVOFLOXACIN 0.5 SENSITIVE Sensitive     * GROUP B STREP(S.AGALACTIAE)ISOLATED   Proteus mirabilis - MIC*    AMPICILLIN <=2 SENSITIVE Sensitive     CEFAZOLIN 8 SENSITIVE Sensitive     CEFEPIME <=1 SENSITIVE Sensitive     CEFTAZIDIME <=1 SENSITIVE Sensitive     CEFTRIAXONE <=1 SENSITIVE Sensitive     CIPROFLOXACIN <=0.25 SENSITIVE Sensitive     GENTAMICIN <=1 SENSITIVE Sensitive     IMIPENEM 2 SENSITIVE Sensitive     TRIMETH/SULFA <=20 SENSITIVE Sensitive     AMPICILLIN/SULBACTAM <=2 SENSITIVE Sensitive     PIP/TAZO <=4 SENSITIVE Sensitive     * PROTEUS MIRABILIS  Blood Culture ID Panel (Reflexed)     Status: Abnormal   Collection Time: 07/24/16  2:00 AM  Result Value Ref Range Status   Enterococcus species NOT DETECTED NOT DETECTED Final   Listeria monocytogenes NOT DETECTED NOT DETECTED Final   Staphylococcus species NOT DETECTED NOT DETECTED Final   Staphylococcus aureus NOT DETECTED NOT DETECTED Final   Streptococcus species DETECTED (A) NOT DETECTED Final    Comment: CRITICAL RESULT CALLED TO, READ BACK BY AND VERIFIED WITH: A. MAYER, [place] ON 07/24/16 BY C. JESSUP, MLT.    Streptococcus agalactiae DETECTED (A) NOT DETECTED Final    Comment: CRITICAL RESULT CALLED TO, READ BACK BY AND VERIFIED WITH: A. MAYER, [place] ON 07/24/16 BY C. JESSUP, MLT.    Streptococcus pneumoniae NOT DETECTED NOT DETECTED Final   Streptococcus pyogenes NOT DETECTED NOT DETECTED Final   Acinetobacter baumannii NOT DETECTED NOT DETECTED Final   Enterobacteriaceae species NOT DETECTED NOT DETECTED Final   Enterobacter cloacae complex NOT DETECTED NOT DETECTED Final   Escherichia coli NOT DETECTED NOT  DETECTED Final   Klebsiella oxytoca NOT DETECTED NOT DETECTED Final   Klebsiella pneumoniae NOT DETECTED NOT DETECTED Final   Proteus species NOT DETECTED NOT DETECTED Final   Serratia marcescens NOT DETECTED NOT DETECTED Final   Haemophilus influenzae NOT DETECTED NOT DETECTED Final   Neisseria meningitidis NOT DETECTED NOT DETECTED Final   Pseudomonas aeruginosa NOT DETECTED NOT DETECTED Final   Candida albicans NOT DETECTED NOT DETECTED Final   Candida  glabrata NOT DETECTED NOT DETECTED Final   Candida krusei NOT DETECTED NOT DETECTED Final   Candida parapsilosis NOT DETECTED NOT DETECTED Final   Candida tropicalis NOT DETECTED NOT DETECTED Final  Blood culture (routine x 2)     Status: Abnormal   Collection Time: 07/24/16  4:05 AM  Result Value Ref Range Status   Specimen Description BLOOD RIGHT FOREARM  Final   Special Requests AEROBIC BOTTLE ONLY 5ML  Final   Culture  Setup Time   Final    AEROBIC BOTTLE ONLY GRAM POSITIVE COCCI IN PAIRS CRITICAL VALUE NOTED.  VALUE IS CONSISTENT WITH PREVIOUSLY REPORTED AND CALLED VALUE. GRAM NEGATIVE RODS    Culture (A)  Final    GROUP B STREP(S.AGALACTIAE)ISOLATED PROTEUS MIRABILIS SUSCEPTIBILITIES PERFORMED ON PREVIOUS CULTURE WITHIN THE LAST 5 DAYS.    Report Status 07/26/2016 FINAL  Final  Culture, respiratory (NON-Expectorated)     Status: None   Collection Time: 07/24/16 12:25 PM  Result Value Ref Range Status   Specimen Description TRACHEAL ASPIRATE  Final   Special Requests Normal  Final   Gram Stain   Final    MODERATE WBC PRESENT, PREDOMINANTLY PMN FEW GRAM POSITIVE COCCI IN PAIRS IN CLUSTERS RARE GRAM NEGATIVE COCCOBACILLI RARE GRAM NEGATIVE RODS RARE GRAM POSITIVE RODS    Culture MULTIPLE ORGANISMS PRESENT, NONE PREDOMINANT  Final   Report Status 07/26/2016 FINAL  Final  MRSA PCR Screening     Status: Abnormal   Collection Time: 07/25/16  6:38 PM  Result Value Ref Range Status   MRSA by PCR POSITIVE (A) NEGATIVE  Final    Comment:        The GeneXpert MRSA Assay (FDA approved for NASAL specimens only), is one component of a comprehensive MRSA colonization surveillance program. It is not intended to diagnose MRSA infection nor to guide or monitor treatment for MRSA infections. CRITICAL RESULT CALLED TO, READ BACK BY AND VERIFIED WITH: TJasper Riling, RN AT 2150 ON 07/25/16 BY C. JESSUP, MLT.   Culture, blood (routine x 2)     Status: Abnormal   Collection Time: 07/26/16  6:15 AM  Result Value Ref Range Status   Specimen Description BLOOD LEFT ANTECUBITAL  Final   Special Requests IN PEDIATRIC BOTTLE  4CC  Final   Culture  Setup Time   Final    GRAM NEGATIVE RODS IN PEDIATRIC BOTTLE CRITICAL RESULT CALLED TO, READ BACK BY AND VERIFIED WITH: PHARMD A.MASTERS [id number] @0808  MLM    Culture ESCHERICHIA COLI (A)  Final   Report Status 08/01/2016 FINAL  Final   Organism ID, Bacteria ESCHERICHIA COLI  Final      Susceptibility   Escherichia coli - MIC*    AMPICILLIN <=2 SENSITIVE Sensitive     CEFAZOLIN <=4 SENSITIVE Sensitive     CEFEPIME <=1 SENSITIVE Sensitive     CEFTAZIDIME <=1 SENSITIVE Sensitive     CEFTRIAXONE <=1 SENSITIVE Sensitive     CIPROFLOXACIN >=4 RESISTANT Resistant     GENTAMICIN <=1 SENSITIVE Sensitive     IMIPENEM <=0.25 SENSITIVE Sensitive     TRIMETH/SULFA <=20 SENSITIVE Sensitive     AMPICILLIN/SULBACTAM <=2 SENSITIVE Sensitive     PIP/TAZO <=4 SENSITIVE Sensitive     Extended ESBL NEGATIVE Sensitive     * ESCHERICHIA COLI  Blood Culture ID Panel (Reflexed)     Status: Abnormal   Collection Time: 07/26/16  6:15 AM  Result Value Ref Range Status   Enterococcus species NOT DETECTED NOT DETECTED Final  Listeria monocytogenes NOT DETECTED NOT DETECTED Final   Staphylococcus species NOT DETECTED NOT DETECTED Final   Staphylococcus aureus NOT DETECTED NOT DETECTED Final   Streptococcus species NOT DETECTED NOT DETECTED Final   Streptococcus agalactiae NOT DETECTED NOT  DETECTED Final   Streptococcus pneumoniae NOT DETECTED NOT DETECTED Final   Streptococcus pyogenes NOT DETECTED NOT DETECTED Final   Acinetobacter baumannii NOT DETECTED NOT DETECTED Final   Enterobacteriaceae species DETECTED (A) NOT DETECTED Final    Comment: CRITICAL RESULT CALLED TO, READ BACK BY AND VERIFIED WITH: PHARMD A.MASTERS [id number] MLM    Enterobacter cloacae complex NOT DETECTED NOT DETECTED Final   Escherichia coli DETECTED (A) NOT DETECTED Final    Comment: CRITICAL RESULT CALLED TO, READ BACK BY AND VERIFIED WITH: PHARMD A.MASTERS [id number] 0808 MLM    Klebsiella oxytoca NOT DETECTED NOT DETECTED Final   Klebsiella pneumoniae NOT DETECTED NOT DETECTED Final   Proteus species NOT DETECTED NOT DETECTED Final   Serratia marcescens NOT DETECTED NOT DETECTED Final   Carbapenem resistance NOT DETECTED NOT DETECTED Final   Haemophilus influenzae NOT DETECTED NOT DETECTED Final   Neisseria meningitidis NOT DETECTED NOT DETECTED Final   Pseudomonas aeruginosa NOT DETECTED NOT DETECTED Final   Candida albicans NOT DETECTED NOT DETECTED Final   Candida glabrata NOT DETECTED NOT DETECTED Final   Candida krusei NOT DETECTED NOT DETECTED Final   Candida parapsilosis NOT DETECTED NOT DETECTED Final   Candida tropicalis NOT DETECTED NOT DETECTED Final  Culture, blood (routine x 2)     Status: None   Collection Time: 07/26/16  6:23 AM  Result Value Ref Range Status   Specimen Description BLOOD LEFT ANTECUBITAL  Final   Special Requests IN PEDIATRIC BOTTLE  3CC  Final   Culture NO GROWTH 5 DAYS  Final   Report Status 07/31/2016 FINAL  Final  Culture, blood (routine x 2)     Status: None (Preliminary result)   Collection Time: 07/30/16  9:44 AM  Result Value Ref Range Status   Specimen Description BLOOD LEFT ANTECUBITAL  Final   Special Requests IN PEDIATRIC BOTTLE  3CC  Final   Culture NO GROWTH < 24 HOURS  Final   Report Status PENDING  Incomplete  Culture, blood (routine x 2)      Status: None (Preliminary result)   Collection Time: 07/30/16  9:47 AM  Result Value Ref Range Status   Specimen Description BLOOD LEFT HAND  Final   Special Requests IN PEDIATRIC BOTTLE  3CC  Final   Culture NO GROWTH < 24 HOURS  Final   Report Status PENDING  Incomplete    Radiology Reports Ct Head Wo Contrast  Result Date: 07/24/2016 CLINICAL DATA:  Initial evaluation for acute altered mental status. EXAM: CT HEAD WITHOUT CONTRAST TECHNIQUE: Contiguous axial images were obtained from the base of the skull through the vertex without intravenous contrast. COMPARISON:  Prior CT from 07/11/2015. FINDINGS: Brain: Cerebral volume within normal limits. No acute intracranial hemorrhage. No evidence for acute large vessel territory infarct. No mass lesion, midline shift or mass effect. No hydrocephalus. No extra-axial fluid collection. Vascular: No hyperdense vessel. Skull: Scalp soft tissues demonstrate no acute abnormality. Calvarium intact. Sinuses/Orbits: Globes and orbital soft tissues within normal limits. Mild scattered mucosal thickening within the sphenoid sinuses. Paranasal sinuses are otherwise clear. No mastoid effusion. IMPRESSION: No acute intracranial process identified. Electronically Signed   By: Jeannine Boga M.D.   On: 07/24/2016 05:39   Mr Pelvis W Wo  Contrast  Result Date: 07/05/2016 CLINICAL DATA:  Ruling out osteomyelitis. Multiple decubitus ulcers on posterior portion of pelvic area. Quadriplegic. EXAM: MRI PELVIS WITHOUT AND WITH CONTRAST TECHNIQUE: Multiplanar multisequence MR imaging of the pelvis was performed both before and after administration of intravenous contrast. CONTRAST:  54mL MULTIHANCE GADOBENATE DIMEGLUMINE 529 MG/ML IV SOLN COMPARISON:  04/28/2015 FINDINGS: Bones/Joint/Cartilage Decubitus ulcers over the sacrum ischial tuberosities bilaterally. Prior ischial tuberosity debridement. No marrow signal abnormality in the left ischial tuberosity. No marrow  edema or bone destruction of the right ischial tuberosity. Mild marrow edema in the right posterior acetabulum with enhancement which may be reactive versus secondary to mild osteomyelitis. The sacrum distal to S2 is surgically absent. No cortical destruction or bone marrow edema within the remainder of the sacrum. No hip fracture, dislocation or avascular necrosis. Sacroiliac joints are normal. Mild chronic L5 vertebral body height loss. Mild bone marrow edema in the inferior endplate of L4 incompletely visualized and characterized. No hip joint effusion.  No SI joint effusion. Muscles and Tendons Diffuse muscle edema throughout the flexor, extensor, adductor and gluteal musculature which may be neurogenic given the patient's history of quadriplegia versus secondary to diffuse mild myositis which is considered less likely. Soft tissue No fluid collection or hematoma. No soft tissue mass. Suprapubic catheter is noted. IMPRESSION: 1. Decubitus ulcers over the sacrum ischial tuberosities bilaterally. Prior ischial tuberosity debridement bilaterally without acute osteomyelitis. Mild marrow edema in the right posterior acetabulum with enhancement which may be reactive versus secondary to mild osteomyelitis. 2. The sacrum distal to S2 is surgically absent. No cortical destruction or bone marrow edema within the remainder of the sacrum. 3. No fluid collection to suggest an abscess. Electronically Signed   By: Kathreen Devoid   On: 07/05/2016 12:14   Ir Cm Inj Any Colonic Tube W/fluoro  Result Date: 07/11/2016 CLINICAL DATA:  Current G tube malfunctioning. Concern of possible retraction. EXAM: GASTROSTOMY CATHETER INJECTION UNDER FLUOROSCOPY TECHNIQUE: The procedure, risks (including but not limited to bleeding, infection, organ damage ), benefits, and alternatives were explained to the patient. Questions regarding the procedure were encouraged and answered. The patient understands and consents to the procedure. Survey  fluoroscopic inspection reveals stable position of the gastrostomy catheter. Injection demonstrates patency of the gastrostomy catheter, flow into the gastric lumen. Some incompletely occlusive filling defects are noted in the tubing. The tube was cleared of debris with specialized brush. The external bumper was repositioned appropriately. Catheter was flushed and capped. The patient tolerated the procedure well. IMPRESSION: 1. Gastrostomy catheter is in good position, okay for routine use. Electronically Signed   By: Lucrezia Europe M.D.   On: 07/11/2016 12:52   Dg Chest Port 1 View  Result Date: 07/24/2016 CLINICAL DATA:  Acute onset of altered mental status. Shortness of breath. Initial encounter. EXAM: PORTABLE CHEST 1 VIEW COMPARISON:  Chest radiograph performed 04/19/2016 FINDINGS: The patient's tracheostomy tube is seen ending 4 cm above the carina. Right basilar airspace opacity raises concern for pneumonia. Small bilateral pleural effusions are suspected. No pneumothorax is seen. The cardiomediastinal silhouette is borderline normal in size. No acute osseous abnormalities are identified. IMPRESSION: Right basilar airspace opacity is concerning for pneumonia. Suspect small bilateral pleural effusions. Electronically Signed   By: Garald Balding M.D.   On: 07/24/2016 02:09    Time Spent in minutes  25   Louellen Molder M.D on 08/01/2016 at 12:52 PM  Between 7am to 7pm - Pager - [phone]  After 7pm go to  [email or web address] - password [facility]  [facility] -  Office  [phone]

## 2016-08-02 ENCOUNTER — Telehealth: Payer: Self-pay

## 2016-08-02 ENCOUNTER — Other Ambulatory Visit: Payer: Self-pay | Admitting: Family Medicine

## 2016-08-02 DIAGNOSIS — L89154 Pressure ulcer of sacral region, stage 4: Secondary | ICD-10-CM

## 2016-08-02 NOTE — Telephone Encounter (Signed)
I reviewed his DC Summary from yesterday. His final Hgb was 7.4 after a transfusion and his final Na level was 133. I did put in a referral to see Dr. Neysa Hotter at [facility] for the sacral decubitus ulcer. Please contact [facility] to have them draw a CBC and a BMET sometime next week to follow up anemia and hyponatremia

## 2016-08-02 NOTE — Telephone Encounter (Signed)
Spoke with pt's mother and advised of Dr. Barbie Banner response. She advised that [name] is his home health nursing provider. Called [facility] and spoke with Barbaraann Rondo, placed verbal orders for BMet and CBC to be drawn next week. They will fax results to office attn:Dr. Sarajane Jews. Nothing further needed.

## 2016-08-02 NOTE — Telephone Encounter (Signed)
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 They have skilled nursing visits daily. Pt is receiving abx infusions from home health nurse. Pt does need repeat BMet, CBC in 2 weeks per d/c orders. Pt also needs referrals placed to Dr.Marks @ [facility] for wound care and closure.     Transition Care Management Follow-up Telephone Call  How have you been since you were released from the hospital? good   Do you understand why you were in the hospital? yes   Do you understand the discharge instrcutions? yes  Items Reviewed:  Medications reviewed: yes  Allergies reviewed: yes  Dietary changes reviewed: yes  Referrals reviewed: yes   Functional Questionnaire:   Activities of Daily Living (ADLs):   He states they are independent in the following: pt is quadraplegic States they require assistance with the following: pt is quadraplegic   Any transportation issues/concerns?: no   Any patient concerns? no   Confirmed importance and date/time of follow-up visits scheduled: yes   Confirmed with patient if condition begins to worsen call PCP or go to the ER.  Patient was given the Call-a-Nurse line [phone]: yes

## 2016-08-03 ENCOUNTER — Telehealth: Payer: Self-pay | Admitting: Family Medicine

## 2016-08-03 NOTE — Telephone Encounter (Signed)
°  Haley from [facility] call to clarify some medication orders and would like a call back    [phone]

## 2016-08-03 NOTE — Telephone Encounter (Signed)
LMTCB for [facility] @ [place]

## 2016-08-04 LAB — CULTURE, BLOOD (ROUTINE X 2)
CULTURE: NO GROWTH
Culture: NO GROWTH

## 2016-08-05 IMAGING — CR DG CHEST 1V PORT
1 series · 1 of 1 positions shown · non-contrast
Comparison: Single view of the chest 05/28/2014 and 05/03/2014.
Chest in two views abdomen 06/30/2013.

CLINICAL DATA: Diabetic patient with fever and tachycardia.
Leukocytosis.

EXAM:
PORTABLE CHEST - 1 VIEW

[AP]
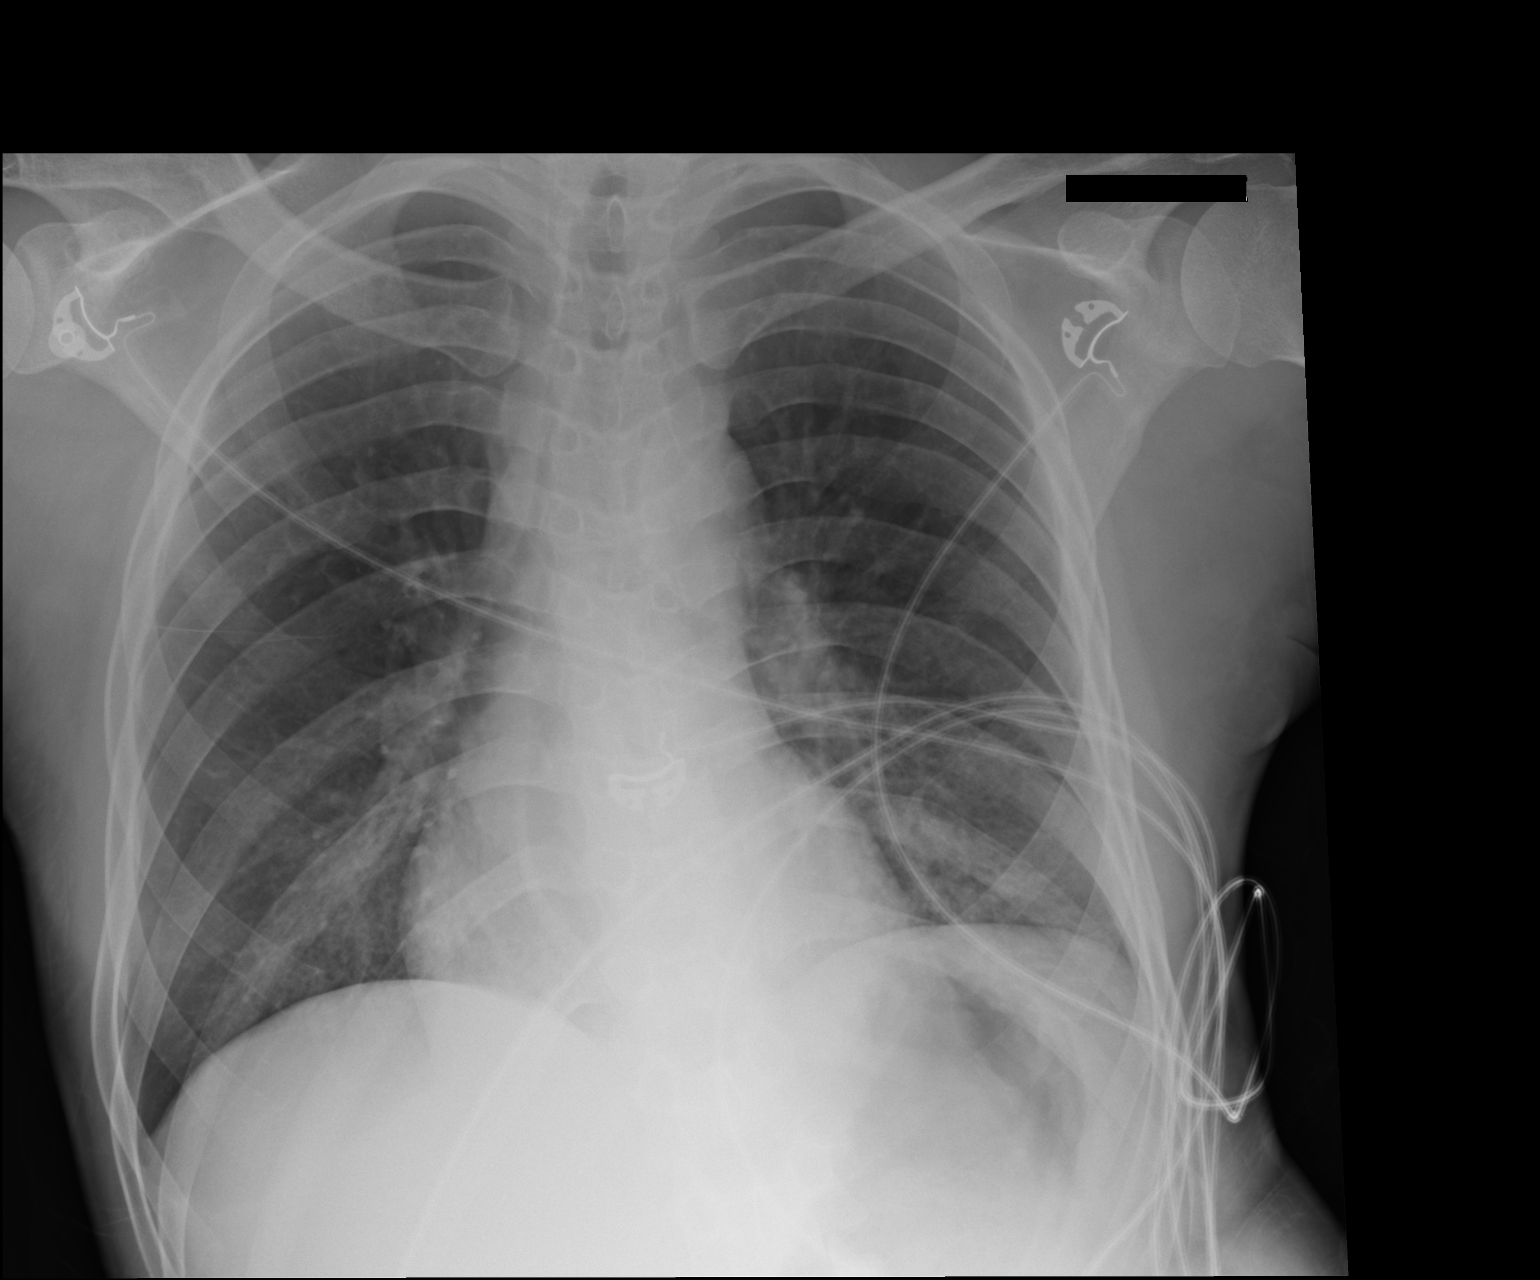

[1 of 1 positions shown; findings below may reference images not displayed]

FINDINGS: Single view of the chest demonstrates patchy left basilar airspace
disease. The right lung is clear. No pneumothorax or pleural
effusion. Heart size is normal.
IMPRESSION: Patchy left basilar airspace disease could be due to atelectasis or
pneumonia.

## 2016-08-05 IMAGING — CR DG CHEST 1V PORT
1 series · 1 of 1 positions shown · non-contrast
Comparison: 05/03/2014

CLINICAL DATA: Fever.

EXAM:
PORTABLE CHEST - 1 VIEW

[view not recorded]
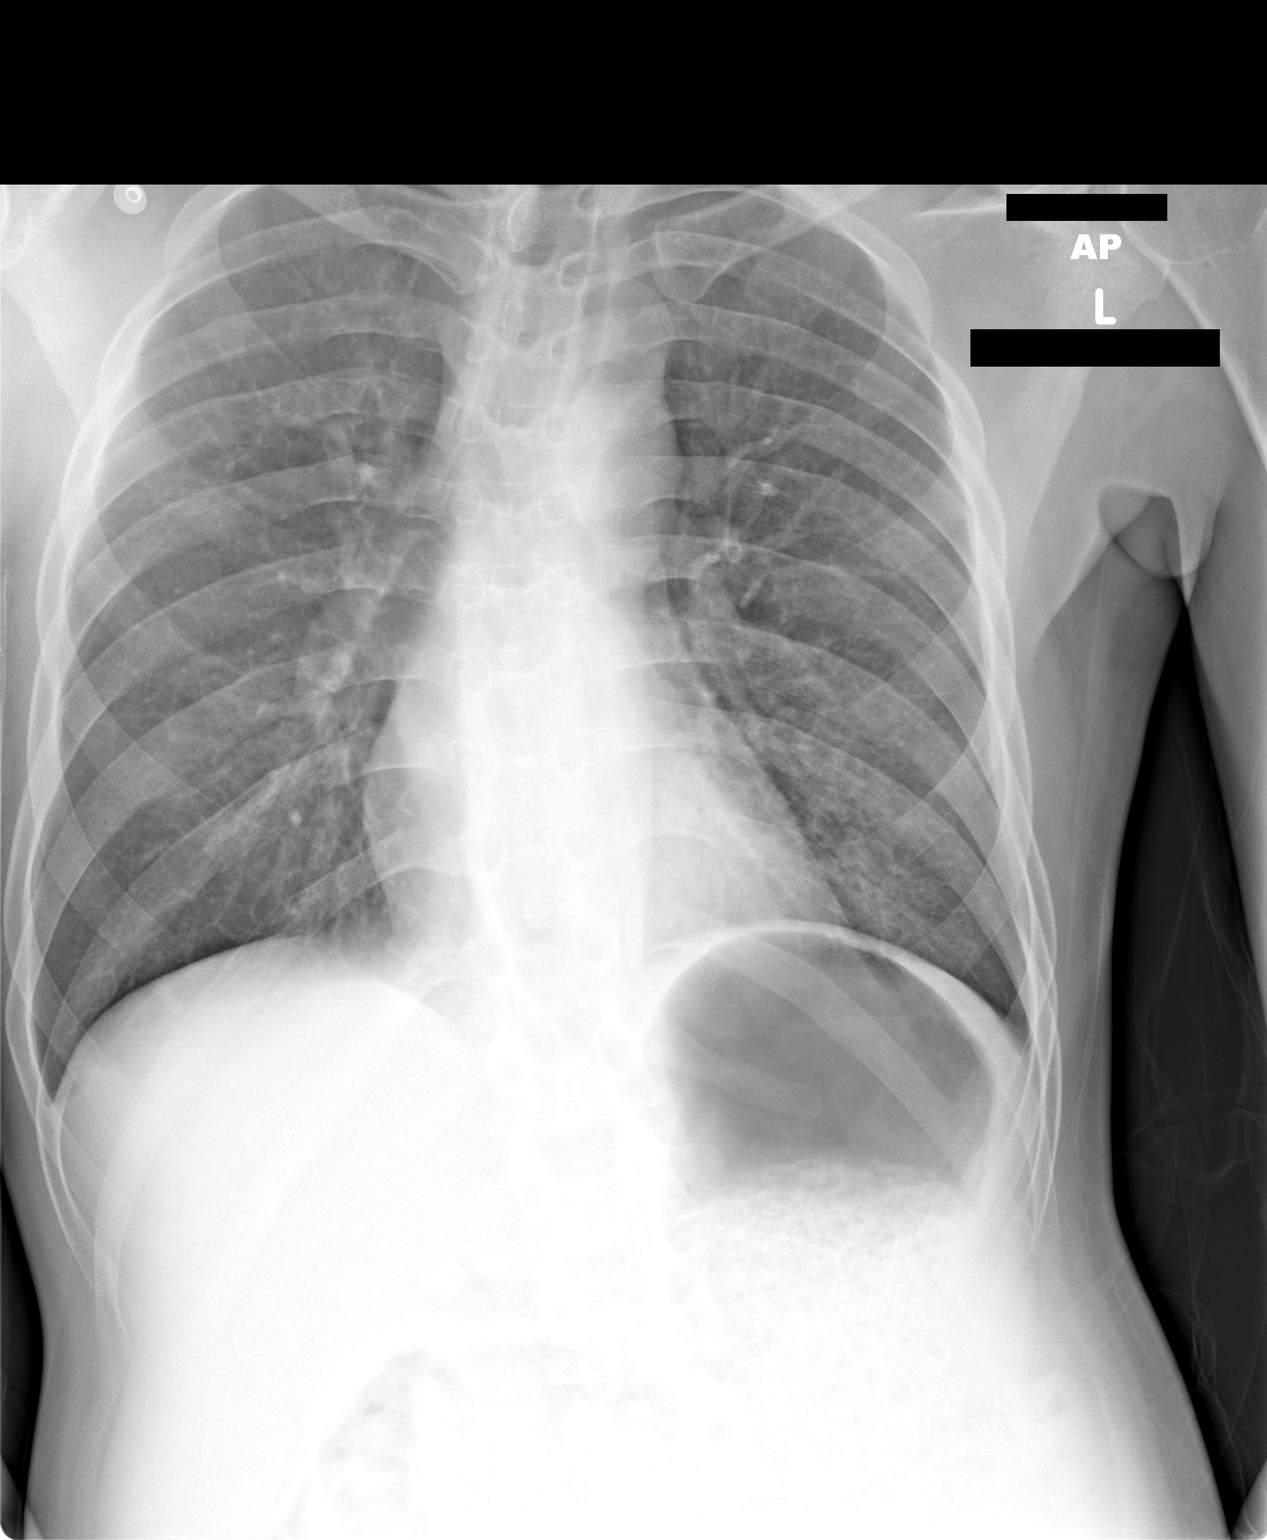

[1 of 1 positions shown; findings below may reference images not displayed]

FINDINGS: There is interstitial coarsening, especially at the bases and on the
left. The appearance is similar to prior, no definitive pneumonia.
No edema, effusion, or pneumothorax. Normal heart size and upper
mediastinal contours. Markedly dilated stomach containing gas and
fluid material. Remote lateral left clavicle fracture.
IMPRESSION: 1. No definitive pneumonia. If ongoing concern, suggest followup to
re-evaluate the left base.
2. Chronically and markedly dilated stomach, likely diabetic
gastroparesis.

## 2016-08-06 ENCOUNTER — Other Ambulatory Visit: Payer: Self-pay | Admitting: Family Medicine

## 2016-08-06 ENCOUNTER — Telehealth: Payer: Self-pay | Admitting: Family Medicine

## 2016-08-06 NOTE — Telephone Encounter (Signed)
Pts mother would like to have a order to get a dual feeding bag.  [facility]  [phone]

## 2016-08-07 ENCOUNTER — Telehealth: Payer: Self-pay | Admitting: Family Medicine

## 2016-08-07 NOTE — Telephone Encounter (Signed)
I spoke with Hildred Alamin and she is going to fax over some information about medications for Dr. Sarajane Jews to review.

## 2016-08-07 NOTE — Telephone Encounter (Signed)
° °  Haly called from [facility] to ask for IC D 10 CODES for the following lab   BMP CBC     [id number]

## 2016-08-07 NOTE — Telephone Encounter (Signed)
Per Dr. Sarajane Jews this order should come from GI doctor. I spoke with mom and she will contact that office.

## 2016-08-08 NOTE — Telephone Encounter (Signed)
I spoke with Jason [name] and gave the code.

## 2016-08-08 NOTE — Telephone Encounter (Signed)
Use E10.8

## 2016-08-08 NOTE — Telephone Encounter (Signed)
Can you read the note on last script? Looks like it was discontinued on discharge.

## 2016-08-09 ENCOUNTER — Inpatient Hospital Stay (HOSPITAL_COMMUNITY)
Admission: EM | Admit: 2016-08-09 | Discharge: 2016-08-18 | DRG: 637 | Disposition: A | Discharge status: Home Health | Payer: Medicaid Other | Attending: Internal Medicine | Admitting: Internal Medicine

## 2016-08-09 ENCOUNTER — Encounter: Payer: Self-pay | Admitting: Family Medicine

## 2016-08-09 ENCOUNTER — Telehealth: Payer: Self-pay | Admitting: Internal Medicine

## 2016-08-09 ENCOUNTER — Encounter (HOSPITAL_COMMUNITY): Payer: Self-pay | Admitting: Emergency Medicine

## 2016-08-09 DIAGNOSIS — Z833 Family history of diabetes mellitus: Secondary | ICD-10-CM

## 2016-08-09 DIAGNOSIS — Z91013 Allergy to seafood: Secondary | ICD-10-CM

## 2016-08-09 DIAGNOSIS — T380X5A Adverse effect of glucocorticoids and synthetic analogues, initial encounter: Secondary | ICD-10-CM | POA: Diagnosis present

## 2016-08-09 DIAGNOSIS — K219 Gastro-esophageal reflux disease without esophagitis: Secondary | ICD-10-CM | POA: Diagnosis present

## 2016-08-09 DIAGNOSIS — Z882 Allergy status to sulfonamides status: Secondary | ICD-10-CM

## 2016-08-09 DIAGNOSIS — D649 Anemia, unspecified: Secondary | ICD-10-CM

## 2016-08-09 DIAGNOSIS — G894 Chronic pain syndrome: Secondary | ICD-10-CM | POA: Diagnosis present

## 2016-08-09 DIAGNOSIS — G9340 Encephalopathy, unspecified: Secondary | ICD-10-CM | POA: Diagnosis present

## 2016-08-09 DIAGNOSIS — E108 Type 1 diabetes mellitus with unspecified complications: Secondary | ICD-10-CM | POA: Diagnosis present

## 2016-08-09 DIAGNOSIS — E1043 Type 1 diabetes mellitus with diabetic autonomic (poly)neuropathy: Secondary | ICD-10-CM | POA: Diagnosis present

## 2016-08-09 DIAGNOSIS — L89324 Pressure ulcer of left buttock, stage 4: Secondary | ICD-10-CM | POA: Diagnosis present

## 2016-08-09 DIAGNOSIS — L89214 Pressure ulcer of right hip, stage 4: Secondary | ICD-10-CM | POA: Diagnosis present

## 2016-08-09 DIAGNOSIS — Z794 Long term (current) use of insulin: Secondary | ICD-10-CM

## 2016-08-09 DIAGNOSIS — Z933 Colostomy status: Secondary | ICD-10-CM

## 2016-08-09 DIAGNOSIS — L89154 Pressure ulcer of sacral region, stage 4: Secondary | ICD-10-CM | POA: Diagnosis present

## 2016-08-09 DIAGNOSIS — R532 Functional quadriplegia: Secondary | ICD-10-CM | POA: Diagnosis present

## 2016-08-09 DIAGNOSIS — T85598A Other mechanical complication of other gastrointestinal prosthetic devices, implants and grafts, initial encounter: Secondary | ICD-10-CM

## 2016-08-09 DIAGNOSIS — Z823 Family history of stroke: Secondary | ICD-10-CM

## 2016-08-09 DIAGNOSIS — E1069 Type 1 diabetes mellitus with other specified complication: Principal | ICD-10-CM | POA: Diagnosis present

## 2016-08-09 DIAGNOSIS — J189 Pneumonia, unspecified organism: Secondary | ICD-10-CM

## 2016-08-09 DIAGNOSIS — R0600 Dyspnea, unspecified: Secondary | ICD-10-CM

## 2016-08-09 DIAGNOSIS — N319 Neuromuscular dysfunction of bladder, unspecified: Secondary | ICD-10-CM | POA: Diagnosis present

## 2016-08-09 DIAGNOSIS — M86659 Other chronic osteomyelitis, unspecified thigh: Secondary | ICD-10-CM | POA: Diagnosis present

## 2016-08-09 DIAGNOSIS — Z87891 Personal history of nicotine dependence: Secondary | ICD-10-CM

## 2016-08-09 DIAGNOSIS — L89224 Pressure ulcer of left hip, stage 4: Secondary | ICD-10-CM | POA: Diagnosis present

## 2016-08-09 DIAGNOSIS — Z8673 Personal history of transient ischemic attack (TIA), and cerebral infarction without residual deficits: Secondary | ICD-10-CM

## 2016-08-09 DIAGNOSIS — E10319 Type 1 diabetes mellitus with unspecified diabetic retinopathy without macular edema: Secondary | ICD-10-CM | POA: Diagnosis present

## 2016-08-09 DIAGNOSIS — R4182 Altered mental status, unspecified: Secondary | ICD-10-CM

## 2016-08-09 DIAGNOSIS — I9589 Other hypotension: Secondary | ICD-10-CM | POA: Diagnosis present

## 2016-08-09 DIAGNOSIS — Z886 Allergy status to analgesic agent status: Secondary | ICD-10-CM

## 2016-08-09 DIAGNOSIS — Z930 Tracheostomy status: Secondary | ICD-10-CM

## 2016-08-09 DIAGNOSIS — D72829 Elevated white blood cell count, unspecified: Secondary | ICD-10-CM

## 2016-08-09 DIAGNOSIS — L89314 Pressure ulcer of right buttock, stage 4: Secondary | ICD-10-CM | POA: Diagnosis present

## 2016-08-09 DIAGNOSIS — Z8249 Family history of ischemic heart disease and other diseases of the circulatory system: Secondary | ICD-10-CM

## 2016-08-09 DIAGNOSIS — J690 Pneumonitis due to inhalation of food and vomit: Secondary | ICD-10-CM | POA: Diagnosis present

## 2016-08-09 DIAGNOSIS — Z87440 Personal history of urinary (tract) infections: Secondary | ICD-10-CM

## 2016-08-09 DIAGNOSIS — E10649 Type 1 diabetes mellitus with hypoglycemia without coma: Secondary | ICD-10-CM | POA: Diagnosis present

## 2016-08-09 DIAGNOSIS — M797 Fibromyalgia: Secondary | ICD-10-CM | POA: Diagnosis present

## 2016-08-09 DIAGNOSIS — Z881 Allergy status to other antibiotic agents status: Secondary | ICD-10-CM

## 2016-08-09 DIAGNOSIS — D638 Anemia in other chronic diseases classified elsewhere: Secondary | ICD-10-CM | POA: Diagnosis not present

## 2016-08-09 DIAGNOSIS — Z885 Allergy status to narcotic agent status: Secondary | ICD-10-CM

## 2016-08-09 DIAGNOSIS — Z931 Gastrostomy status: Secondary | ICD-10-CM

## 2016-08-09 DIAGNOSIS — M86651 Other chronic osteomyelitis, right thigh: Secondary | ICD-10-CM | POA: Diagnosis not present

## 2016-08-09 DIAGNOSIS — Z825 Family history of asthma and other chronic lower respiratory diseases: Secondary | ICD-10-CM

## 2016-08-09 DIAGNOSIS — Z880 Allergy status to penicillin: Secondary | ICD-10-CM

## 2016-08-09 DIAGNOSIS — E1065 Type 1 diabetes mellitus with hyperglycemia: Secondary | ICD-10-CM | POA: Diagnosis present

## 2016-08-09 DIAGNOSIS — J45909 Unspecified asthma, uncomplicated: Secondary | ICD-10-CM | POA: Diagnosis present

## 2016-08-09 LAB — CBC WITH DIFFERENTIAL/PLATELET
BASOS ABS: 0 10*3/uL (ref 0.0–0.1)
Basophils Relative: 0 %
EOS ABS: 0 10*3/uL (ref 0.0–0.7)
Eosinophils Relative: 0 %
HCT: 22.5 % — ABNORMAL LOW (ref 39.0–52.0)
Hemoglobin: 7 g/dL — ABNORMAL LOW (ref 13.0–17.0)
LYMPHS PCT: 10 %
Lymphs Abs: 2.4 10*3/uL (ref 0.7–4.0)
MCH: 26.1 pg (ref 26.0–34.0)
MCHC: 31.1 g/dL (ref 30.0–36.0)
MCV: 84 fL (ref 78.0–100.0)
Monocytes Absolute: 2.2 10*3/uL — ABNORMAL HIGH (ref 0.1–1.0)
Monocytes Relative: 9 %
NEUTROS ABS: 19.6 10*3/uL — AB (ref 1.7–7.7)
NEUTROS PCT: 81 %
PLATELETS: 474 10*3/uL — AB (ref 150–400)
RBC: 2.68 MIL/uL — AB (ref 4.22–5.81)
RDW: 17.5 % — ABNORMAL HIGH (ref 11.5–15.5)
WBC: 24.2 10*3/uL — AB (ref 4.0–10.5)

## 2016-08-09 LAB — URINE MICROSCOPIC-ADD ON

## 2016-08-09 LAB — COMPREHENSIVE METABOLIC PANEL
ALK PHOS: 110 U/L (ref 38–126)
ALT: 8 U/L — ABNORMAL LOW (ref 17–63)
ANION GAP: 7 (ref 5–15)
AST: 13 U/L — ABNORMAL LOW (ref 15–41)
Albumin: 1.8 g/dL — ABNORMAL LOW (ref 3.5–5.0)
BILIRUBIN TOTAL: 0.5 mg/dL (ref 0.3–1.2)
BUN: 19 mg/dL (ref 6–20)
CALCIUM: 9.2 mg/dL (ref 8.9–10.3)
CO2: 20 mmol/L — ABNORMAL LOW (ref 22–32)
Chloride: 104 mmol/L (ref 101–111)
Creatinine, Ser: 0.62 mg/dL (ref 0.61–1.24)
GLUCOSE: 81 mg/dL (ref 65–99)
Potassium: 4 mmol/L (ref 3.5–5.1)
Sodium: 131 mmol/L — ABNORMAL LOW (ref 135–145)
TOTAL PROTEIN: 6.5 g/dL (ref 6.5–8.1)

## 2016-08-09 LAB — URINALYSIS, ROUTINE W REFLEX MICROSCOPIC
BILIRUBIN URINE: NEGATIVE
Glucose, UA: NEGATIVE mg/dL
Ketones, ur: NEGATIVE mg/dL
NITRITE: NEGATIVE
PROTEIN: 100 mg/dL — AB
Specific Gravity, Urine: 1.009 (ref 1.005–1.030)
pH: 6.5 (ref 5.0–8.0)

## 2016-08-09 LAB — I-STAT CG4 LACTIC ACID, ED
LACTIC ACID, VENOUS: 0.4 mmol/L — AB (ref 0.5–1.9)
Lactic Acid, Venous: <0.3 mmol/L — ABNORMAL LOW (ref 0.5–1.9)

## 2016-08-09 MED ORDER — CAMPHOR-MENTHOL 0.5-0.5 % EX LOTN: TOPICAL_LOTION | CUTANEOUS | Status: DC | PRN

## 2016-08-09 MED ORDER — INSULIN ASPART 100 UNIT/ML ~~LOC~~ SOLN
0.0000 [IU] | Freq: Four times a day (QID) | SUBCUTANEOUS | Status: DC | PRN
  Administered 2016-08-10: 4 [IU] via SUBCUTANEOUS
  Filled 2016-08-09: qty 0.1

## 2016-08-09 MED ORDER — DEXTROSE 5 % IV SOLN
2.0000 g | Freq: Two times a day (BID) | INTRAVENOUS | Status: DC
  Administered 2016-08-10 (×2): 2 g via INTRAVENOUS
  Filled 2016-08-09 (×3): qty 2

## 2016-08-09 MED ORDER — INSULIN GLARGINE 100 UNIT/ML ~~LOC~~ SOLN
22.0000 [IU] | Freq: Every day | SUBCUTANEOUS | Status: DC
  Administered 2016-08-10 (×2): 22 [IU] via SUBCUTANEOUS
  Filled 2016-08-09 (×3): qty 0.22

## 2016-08-09 MED ORDER — BACLOFEN 10 MG PO TABS
20.0000 mg | ORAL_TABLET | Freq: Four times a day (QID) | ORAL | Status: DC
  Administered 2016-08-10 – 2016-08-18 (×32): 20 mg via ORAL
  Filled 2016-08-09 (×2): qty 1
  Filled 2016-08-09 (×2): qty 2
  Filled 2016-08-09 (×2): qty 1
  Filled 2016-08-09 (×3): qty 2
  Filled 2016-08-09: qty 1
  Filled 2016-08-09: qty 2
  Filled 2016-08-09: qty 1
  Filled 2016-08-09: qty 2
  Filled 2016-08-09: qty 1
  Filled 2016-08-09: qty 2
  Filled 2016-08-09: qty 1
  Filled 2016-08-09: qty 2
  Filled 2016-08-09 (×2): qty 1
  Filled 2016-08-09: qty 2
  Filled 2016-08-09: qty 1
  Filled 2016-08-09: qty 2
  Filled 2016-08-09 (×3): qty 1
  Filled 2016-08-09 (×3): qty 2
  Filled 2016-08-09 (×5): qty 1

## 2016-08-09 MED ORDER — FERROUS SULFATE 325 (65 FE) MG PO TABS
325.0000 mg | ORAL_TABLET | Freq: Every day | ORAL | Status: DC
  Administered 2016-08-10 – 2016-08-18 (×9): 325 mg via ORAL
  Filled 2016-08-09 (×9): qty 1

## 2016-08-09 MED ORDER — LORAZEPAM 0.5 MG PO TABS: 0.2500 mg | ORAL_TABLET | Freq: Three times a day (TID) | ORAL | Status: DC | PRN

## 2016-08-09 MED ORDER — MAGNESIUM OXIDE 400 (241.3 MG) MG PO TABS
400.0000 mg | ORAL_TABLET | Freq: Two times a day (BID) | ORAL | Status: DC
  Administered 2016-08-10 – 2016-08-18 (×16): 400 mg
  Filled 2016-08-09 (×17): qty 1

## 2016-08-09 MED ORDER — PANTOPRAZOLE SODIUM 40 MG PO TBEC
80.0000 mg | DELAYED_RELEASE_TABLET | Freq: Every day | ORAL | Status: DC
  Administered 2016-08-10 – 2016-08-18 (×8): 80 mg via ORAL
  Filled 2016-08-09 (×9): qty 2

## 2016-08-09 MED ORDER — SODIUM CHLORIDE 0.9 % IV BOLUS (SEPSIS)
1000.0000 mL | Freq: Once | INTRAVENOUS | Status: AC
  Administered 2016-08-09: 1000 mL via INTRAVENOUS

## 2016-08-09 MED ORDER — GLUCERNA 1.2 CAL PO LIQD
70.0000 mL/h | ORAL | Status: DC
  Administered 2016-08-10: 70 mL/h
  Filled 2016-08-09 (×3): qty 1000

## 2016-08-09 MED ORDER — METHYLPREDNISOLONE SODIUM SUCC 40 MG IJ SOLR: 20.0000 mg | Freq: Two times a day (BID) | INTRAMUSCULAR | Status: DC

## 2016-08-09 MED ORDER — DICLOFENAC SODIUM 1 % TD GEL
2.0000 g | Freq: Two times a day (BID) | TRANSDERMAL | Status: DC
  Administered 2016-08-10 – 2016-08-17 (×5): 2 g via TOPICAL
  Filled 2016-08-09 (×2): qty 100

## 2016-08-09 MED ORDER — MIDODRINE HCL 5 MG PO TABS
20.0000 mg | ORAL_TABLET | Freq: Three times a day (TID) | ORAL | Status: DC
  Administered 2016-08-10 – 2016-08-18 (×25): 20 mg via ORAL
  Filled 2016-08-09 (×26): qty 4

## 2016-08-09 MED ORDER — PREGABALIN 100 MG PO CAPS
200.0000 mg | ORAL_CAPSULE | Freq: Every day | ORAL | Status: DC
  Administered 2016-08-10 – 2016-08-17 (×8): 200 mg via ORAL
  Filled 2016-08-09 (×7): qty 2
  Filled 2016-08-09: qty 4

## 2016-08-09 MED ORDER — FREE WATER
300.0000 mL | Freq: Three times a day (TID) | Status: DC
  Administered 2016-08-10 – 2016-08-15 (×18): 300 mL
  Administered 2016-08-16: 400 mL
  Administered 2016-08-16 – 2016-08-18 (×7): 300 mL

## 2016-08-09 MED ORDER — ADULT MULTIVITAMIN W/MINERALS CH
1.0000 | ORAL_TABLET | Freq: Every day | ORAL | Status: DC
  Administered 2016-08-10 – 2016-08-18 (×9): 1
  Filled 2016-08-09 (×9): qty 1

## 2016-08-09 MED ORDER — FAMOTIDINE 20 MG PO TABS
20.0000 mg | ORAL_TABLET | Freq: Two times a day (BID) | ORAL | Status: DC
  Administered 2016-08-10 – 2016-08-14 (×9): 20 mg via ORAL
  Filled 2016-08-09 (×9): qty 1

## 2016-08-09 MED ORDER — BUPROPION HCL ER (XL) 150 MG PO TB24
150.0000 mg | ORAL_TABLET | Freq: Every day | ORAL | Status: DC
  Administered 2016-08-10 – 2016-08-12 (×3): 150 mg via ORAL
  Filled 2016-08-09 (×4): qty 1

## 2016-08-09 MED ORDER — METHYLPREDNISOLONE SODIUM SUCC 40 MG IJ SOLR
20.0000 mg | Freq: Two times a day (BID) | INTRAMUSCULAR | Status: DC
  Administered 2016-08-10: 20 mg via INTRAVENOUS
  Filled 2016-08-09: qty 1

## 2016-08-09 MED ORDER — OXYBUTYNIN CHLORIDE 5 MG PO TABS
5.0000 mg | ORAL_TABLET | Freq: Two times a day (BID) | ORAL | Status: DC
  Administered 2016-08-10 – 2016-08-18 (×17): 5 mg
  Filled 2016-08-09 (×18): qty 1

## 2016-08-09 MED ORDER — METHYLPREDNISOLONE SODIUM SUCC 40 MG IJ SOLR
20.0000 mg | INTRAMUSCULAR | Status: AC
  Administered 2016-08-10: 20 mg via INTRAVENOUS
  Filled 2016-08-09: qty 1

## 2016-08-09 MED ORDER — ONDANSETRON 4 MG PO TBDP
8.0000 mg | ORAL_TABLET | Freq: Three times a day (TID) | ORAL | Status: DC | PRN
  Administered 2016-08-11: 8 mg via ORAL
  Filled 2016-08-09: qty 1
  Filled 2016-08-09: qty 2

## 2016-08-09 MED ORDER — LORATADINE 10 MG PO TABS
10.0000 mg | ORAL_TABLET | Freq: Every day | ORAL | Status: DC
  Administered 2016-08-10: 10 mg via ORAL
  Filled 2016-08-09: qty 1

## 2016-08-09 MED ORDER — ACETAMINOPHEN 325 MG PO TABS
650.0000 mg | ORAL_TABLET | Freq: Four times a day (QID) | ORAL | Status: DC | PRN
  Administered 2016-08-11 – 2016-08-17 (×2): 650 mg via ORAL
  Filled 2016-08-09 (×2): qty 2

## 2016-08-09 MED ORDER — ENOXAPARIN SODIUM 40 MG/0.4ML ~~LOC~~ SOLN
40.0000 mg | SUBCUTANEOUS | Status: DC
  Administered 2016-08-10 – 2016-08-18 (×9): 40 mg via SUBCUTANEOUS
  Filled 2016-08-09 (×9): qty 0.4

## 2016-08-09 MED ORDER — BISACODYL 5 MG PO TBEC
5.0000 mg | DELAYED_RELEASE_TABLET | Freq: Two times a day (BID) | ORAL | Status: DC | PRN
  Filled 2016-08-09: qty 1

## 2016-08-09 MED ORDER — MUPIROCIN 2 % EX OINT
TOPICAL_OINTMENT | Freq: Two times a day (BID) | CUTANEOUS | Status: DC
  Administered 2016-08-10: 1 via TOPICAL
  Administered 2016-08-10 – 2016-08-11 (×2): via TOPICAL
  Administered 2016-08-12: 1 via TOPICAL
  Administered 2016-08-12 – 2016-08-17 (×9): via TOPICAL
  Administered 2016-08-17: 1 via TOPICAL
  Administered 2016-08-17 – 2016-08-18 (×2): via TOPICAL
  Filled 2016-08-09 (×6): qty 22

## 2016-08-09 MED ORDER — DOCUSATE SODIUM 50 MG/5ML PO LIQD
100.0000 mg | Freq: Every day | ORAL | Status: DC
  Administered 2016-08-10 – 2016-08-17 (×8): 100 mg via ORAL
  Filled 2016-08-09 (×9): qty 10

## 2016-08-09 MED ORDER — GLUCAGON (RDNA) 1 MG IJ KIT: 1.0000 mg | PACK | Freq: Once | INTRAMUSCULAR | Status: DC | PRN

## 2016-08-09 MED ORDER — ALBUTEROL SULFATE (2.5 MG/3ML) 0.083% IN NEBU: 2.5000 mg | INHALATION_SOLUTION | Freq: Four times a day (QID) | RESPIRATORY_TRACT | Status: DC

## 2016-08-09 MED ORDER — FENTANYL 50 MCG/HR TD PT72
75.0000 ug | MEDICATED_PATCH | TRANSDERMAL | Status: DC
  Administered 2016-08-11 – 2016-08-17 (×3): 75 ug via TRANSDERMAL
  Filled 2016-08-09 (×3): qty 1

## 2016-08-09 MED ORDER — OXYCODONE HCL 5 MG PO TABS
20.0000 mg | ORAL_TABLET | Freq: Four times a day (QID) | ORAL | Status: DC | PRN
  Administered 2016-08-13 – 2016-08-18 (×6): 20 mg via ORAL
  Filled 2016-08-09 (×7): qty 4

## 2016-08-09 MED ORDER — HYDROMORPHONE HCL 2 MG PO TABS
8.0000 mg | ORAL_TABLET | Freq: Four times a day (QID) | ORAL | Status: DC | PRN
  Administered 2016-08-10 – 2016-08-17 (×14): 8 mg via ORAL
  Filled 2016-08-09 (×15): qty 4

## 2016-08-09 NOTE — Telephone Encounter (Signed)
Patient had blood work that showed hgb of 6.5. I have asked them to come to ED for evaluation of anemia, will likely need blood transfusion. Plus, will also need evaluation to sacral wound.

## 2016-08-09 NOTE — ED Notes (Signed)
Delay in lab draw per nurse he will talk to edp because family doesn't want pt to be stuck anymore.

## 2016-08-09 NOTE — ED Notes (Signed)
Gave pt a cup of ice, per Resident Physician - Gaddy.

## 2016-08-09 NOTE — ED Notes (Signed)
Gave pt pre-packed lunch bag ([place] sandwich and applesauce), per Dr. Stark Jock.

## 2016-08-09 NOTE — ED Provider Notes (Signed)
[facility] DEPT Provider Note   CSN: [id number] Arrival date & time: 08/09/16  1716     History   Chief Complaint Chief Complaint  Patient presents with  . low hemoglobin    HPI Jason [name] is a 30 y.o. male.   Fever   This is a new problem. The current episode started yesterday. The problem occurs constantly. The problem has not changed since onset.The maximum temperature noted was 102 to 102.9 F. Pertinent negatives include no chest pain, no sleepiness, no diarrhea, no vomiting, no congestion, no headaches and no cough. Associated symptoms comments: Worsening odor and purulence.    Past Medical History:  Diagnosis Date  . Anemia   . Anxiety   . Arthritis   . Asthma   . Chronic osteomyelitis involving pelvic region and thigh ([place]) 06/18/2016  . Colostomy in place [facility])   . Complication of anesthesia    difficulty waking up, will have sudden drop in BP and O2 sats  . Depression   . Diabetic neuropathy ([place])   . Diabetic retinopathy ([place])   . Dysrhythmia    usually due to medications  . Family history of anesthesia complication    Pt mother can't have epidural procedures  . Fibromyalgia   . Gastroparesis   . GERD (gastroesophageal reflux disease)   . H/O constipation   . H/O respiratory failure   . H/O thrombosis   . Hx MRSA infection    on face  . Multiple allergies 11/02/2015  . Pneumonia   . Recurrent UTI 11/02/2015  . Seizures ([place]) 2017   one time during admission in hosptital  . 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 Syncope 02/16/2015  . Tracheostomy present ([place])   . Type I diabetes mellitus ([place])    sees Dr. Loanne Drilling     Patient Active Problem List   Diagnosis Date Noted  . PEG tube malfunction ([place])   . HCAP (healthcare-associated pneumonia) 07/24/2016  . Encephalopathy acute 07/24/2016  . Osteomyelitis ([place]) 07/24/2016  . Functional quadriplegia ([place]) 07/24/2016  . Adrenal insufficiency ([place]) 07/24/2016  . GERD  (gastroesophageal reflux disease) 07/24/2016  . Anemia   . Chronic osteomyelitis involving pelvic region and thigh ([place]) 06/18/2016  . Infection due to multidrug-resistant Pseudomonas aeruginosa   . Hypotension   . Anemia of chronic disease   . Diabetes mellitus type 1, uncontrolled, with complications ([place])   . Yeast UTI   . Decubitus ulcer   . Gastroparesis   . Ventilator dependence ([place])   . Tracheostomy care ([place])   . Arterial hypotension   . Palliative care encounter   . VAP (ventilator-associated pneumonia) ([place])   . Pneumonia of both lower lobes due to Pseudomonas species ([place])   . Acute on chronic respiratory failure with hypoxia ([place])   . Seizures ([place])   . Anxiety state   . Chronic hypotension   . Acute renal failure ([place])   . Urinary tract infection, site not specified   . Diabetes mellitus type 1 with complications ([place])   . Fibromyalgia   . PNA (pneumonia)   . Abdominal distension   . Fever   . Healthcare-associated pneumonia   . Pyrexia   . Fistula   . Pneumonia   . Pressure ulcer 12/07/2015  . Sepsis ([place])   . Acute respiratory failure ([place])   . Respiratory failure, chronic ([place]) 12/06/2015  . Lower urinary tract infectious disease   . Hyponatremia   . Paraplegia ([place])   .  Multiple allergies 11/02/2015  . Recurrent UTI 11/02/2015  . Tracheitis   . Tracheostomy status ([place])   . Eosinophilia   . FUO (fever of unknown origin)   . Severe sepsis ([place])   . Severe dehydration 08/25/2015  . Acute kidney injury ([place]) 08/25/2015  . AKI (acute kidney injury) ([place])   . History of MDR Pseudomonas aeruginosa infection   . MDR Acinetobacter baumannii infection   . 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) 07/15/2015  . Decubitus ulcer of left perineal ischial region, stage 4 ([place]) 07/15/2015  . Decubitus ulcer of sacral region, stage 4 ([place]) 07/15/2015  . Hypomagnesemia   . Acute on chronic respiratory failure ([place])   . Tracheostomy  dependent ([place])   . Chronic anemia 07/10/2015  . Uncontrolled type 1 diabetes mellitus with hyperglycemia ([place]) 07/10/2015  . Dysphagia, pharyngoesophageal phase 07/04/2015  . Chronic respiratory failure with hypoxia ([place])   . Severe protein-calorie malnutrition ([place])   . DVT (deep venous thrombosis) ([place])   . History of pulmonary embolism   . 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([place])   . 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 ([place])   . Suprapubic catheter ([place])   . Bacteremia due to group B Streptococcus   . Acute encephalopathy 07/18/2014  . SIRS (systemic inflammatory response syndrome) ([place]) 05/28/2014  . Neurogenic bladder 04/19/2014  . Gastroparesis due to DM ([place]) 06/02/2013    Past Surgical History:  Procedure Laterality Date  . APPLICATION OF A-CELL OF BACK N/A 11/12/2014   Procedure: APPLICATION A CELL AND VAC ;  Surgeon: Theodoro Kos, DO;  Location: [facility];  Service: Plastics;  Laterality: N/A;  . APPLICATION OF A-CELL OF BACK N/A 12/08/2014   Procedure: APPLICATION OF A-CELL AND WOUND VAC ;  Surgeon: Theodoro Kos, DO;  Location: [facility];  Service: Plastics;  Laterality: N/A;  . APPLICATION OF A-CELL OF CHEST/ABDOMEN N/A 08/04/2015   Procedure: APPLICATION OF A-CELL ;  Surgeon: Loel Lofty Dillingham, DO;  Location: [facility];  Service: Plastics;  Laterality: N/A;  . COLOSTOMY    . DEBRIDMENT OF DECUBITUS ULCER N/A 10/04/2014   Procedure: DEBRIDMENT OF DECUBITUS ULCER;  Surgeon: Georganna Skeans, MD;  Location: [facility];  Service: General;  Laterality: N/A;  . GASTROSTOMY TUBE PLACEMENT    . HEMATOMA EVACUATION N/A 05/05/2015   Procedure: EVACUATION HEMATOMA bedside procedure;  Surgeon: Theodoro Kos, DO;  Location: [facility];  Service: Plastics;  Laterality: N/A;  . INCISION AND DRAINAGE OF WOUND N/A 11/12/2014   Procedure: IRRIGATION AND DEBRIDEMENT OF WOUNDS WITH BONE BIOPSY AND SURGICAL PREP ;  Surgeon: Theodoro Kos, DO;  Location: [facility];  Service: Plastics;  Laterality: N/A;  .  INCISION AND DRAINAGE OF WOUND N/A 11/18/2014   Procedure: IRRIGATION AND DEBRIDEMENT OF SACRAL ULCER ONLY WITH PLACEMENT OF A CELL AND VAC/ DRESSING CHANGE TO UPPER BACK AREA.;  Surgeon: Theodoro Kos, DO;  Location: [facility];  Service: Plastics;  Laterality: N/A;  . INCISION AND DRAINAGE OF WOUND N/A 11/25/2014   Procedure: IRRIGATION AND DEBRIDEMENT OF SACRAL ULCER AND BACK BURN WITH PLACEMENT OF A-CELL;  Surgeon: Theodoro Kos, DO;  Location: [facility];  Service: Plastics;  Laterality: N/A;  . INCISION AND DRAINAGE OF WOUND Right 12/08/2014   Procedure: IRRIGATION AND DEBRIDEMENT SACRAL WOUND AND RIGHT ISCHIAL WOUND ;  Surgeon: Theodoro Kos, DO;  Location: [facility];  Service: Plastics;  Laterality: Right;  . INCISION AND DRAINAGE OF WOUND Bilateral 05/05/2015   Procedure: IRRIGATION  AND DEBRIDEMENT SACRAL ULCER;  Surgeon: Theodoro Kos, DO;  Location: [facility];  Service: Plastics;  Laterality: Bilateral;  . INCISION AND DRAINAGE OF WOUND N/A 08/04/2015   Procedure: IRRIGATION AND DEBRIDEMENT OF SACRAL ULCER AND ;  Surgeon: Loel Lofty Dillingham, DO;  Location: [facility];  Service: Plastics;  Laterality: N/A;  . INSERTION OF SUPRAPUBIC CATHETER N/A 10/12/2014   Procedure: INSERTION OF SUPRAPUBIC CATHETER;  Surgeon: Reece Packer, MD;  Location: [facility];  Service: Urology;  Laterality: N/A;  . IR GENERIC HISTORICAL  07/11/2016   IR CM INJ ANY COLONIC TUBE W/FLUORO 07/11/2016 Arne Cleveland, MD [facility]-INTERV RAD  . LAPAROSCOPIC DIVERTED COLOSTOMY N/A 10/12/2014   Procedure: LAPAROSCOPIC DIVERTING COLOSTOMY;  Surgeon: Donnie Mesa, MD;  Location: [facility];  Service: General;  Laterality: N/A;  . MINOR APPLICATION OF WOUND VAC N/A 11/25/2014   Procedure:  WOUND VAC CHANGE;  Surgeon: Theodoro Kos, DO;  Location: [facility];  Service: Plastics;  Laterality: N/A;  . MULTIPLE EXTRACTIONS WITH ALVEOLOPLASTY N/A 08/03/2014   Procedure: MULTIPLE EXTRACTIONS;  Surgeon: Gae Bon, DDS;  Location: [facility];  Service: Oral Surgery;   Laterality: N/A;  . RADIOLOGY WITH ANESTHESIA N/A 07/05/2016   Procedure: RADIOLOGY WITH ANESTHESIA MRI OF PELVIS WITH AND WITHOUT;  Surgeon: Medication Radiologist, MD;  Location: [facility];  Service: Radiology;  Laterality: N/A;  . TEE WITHOUT CARDIOVERSION N/A 08/17/2014   Procedure: TRANSESOPHAGEAL ECHOCARDIOGRAM (TEE);  Surgeon: Dorothy Spark, MD;  Location: [facility];  Service: Cardiovascular;  Laterality: N/A;  . TEE WITHOUT CARDIOVERSION N/A 05/05/2015   Procedure: TRANSESOPHAGEAL ECHOCARDIOGRAM (TEE);  Surgeon: Lelon Perla, MD;  Location: [facility] ENDOSCOPY;  Service: Cardiovascular;  Laterality: N/A;  . TONSILLECTOMY         Home Medications    Prior to Admission medications   Medication Sig Start Date End Date Taking? Authorizing Provider  acetaminophen (TYLENOL) 325 MG tablet Take 2 tablets (650 mg total) by mouth every 6 (six) hours as needed for mild pain, moderate pain, fever or headache. 01/14/15   Geradine Girt, DO  albuterol (PROVENTIL) (2.5 MG/3ML) 0.083% nebulizer solution Take 3 mLs (2.5 mg total) by nebulization 4 (four) times daily. 08/03/16   [name], MD  baclofen (LIORESAL) 20 MG tablet Take 1 tablet (20 mg total) by mouth 4 (four) times daily. 06/26/16   [name], MD  BISACODYL 5 MG EC tablet Take ONE (1) tablet by mouth twice daily as needed FOR MODERATE CONSTIPATION 06/19/16   [name], MD  buPROPion (WELLBUTRIN XL) 150 MG 24 hr tablet Take 1 tablet (150 mg total) by mouth daily. 06/15/16   [name], MD  camphor-menthol [facility]) lotion Apply topically as needed for itching. 09/02/15   Shanker Kristeen Mans, MD  ceFEPIme 2 g in dextrose 5 % 50 mL Inject 2 g into the vein every 12 (twelve) hours. 07/31/16 08/10/16  Barton Dubois, MD  dexlansoprazole (DEXILANT) 60 MG capsule Take 1 capsule (60 mg total) by mouth daily. 03/14/16   [name], MD  docusate (COLACE) 50 MG/5ML liquid Take 10 mLs (100 mg total) by mouth 2 (two) times daily. Patient taking  differently: Take 100 mg by mouth daily.  11/10/15   [name], MD  famotidine (PEPCID) 20 MG tablet Take 1 tablet (20 mg total) by mouth 2 (two) times daily. Take around antibiotics infusion time 07/31/16   Barton Dubois, MD  fentaNYL (DURAGESIC - DOSED MCG/HR) 75 MCG/HR place 1 patch (  74mcg total) onto the skin every 3 (three) days 07/11/16   [name], MD  ferrous sulfate 325 (65 FE) MG tablet Take 1 tablet (325 mg total) by mouth daily with breakfast. 09/27/15   [name], MD  glucagon (GLUCAGON EMERGENCY) 1 MG injection INJECT INTO MUSCLE ONCE AS NEEDED Patient taking differently: Inject 1 mg into the muscle once as needed (hypoglycemia). INJECT INTO MUSCLE ONCE AS NEEDED 07/05/15   Renato Shin, MD  GLUCERNA (GLUCERNA) LIQD 1,000 mLs by PEG Tube route continuous. 70 ml/hr    Historical Provider, MD  hydrocortisone (CORTEF) 10 MG tablet Take 1 tablet (10 mg total) by mouth every evening. Hold for the next 10 days, while using solumedrol. Then resume as previously instructed 07/31/16   Barton Dubois, MD  hydrocortisone (CORTEF) 20 MG tablet Take 1 tablet (20 mg total) by mouth every morning. Hold for the next 10 days, while on solumedrol. Resume after this time as previously 07/31/16   Barton Dubois, MD  HYDROmorphone (DILAUDID) 8 MG tablet Take ONE (1) tablet (8mg  total) by mouth every 6 hours as needed for severe pain Patient taking differently: Take 8 mg by mouth every 6 (six) hours as needed for severe pain.  07/11/16   [name], MD  insulin aspart (NOVOLOG FLEXPEN) 100 UNIT/ML FlexPen Inject 0-10 Units into the skin 4 (four) times daily as needed for high blood sugar (CBG >200). Per sliding scale: CBG <200 = 0 units, 201-300 = 4 units, 301-400 = 6 units, >400 = 10 units    Historical Provider, MD  insulin glargine (LANTUS) 100 UNIT/ML injection Inject 0.22 mLs (22 Units total) into the skin at bedtime. 07/31/16   Barton Dubois, MD  loratadine (CLARITIN) 10 MG tablet Take 1 tablet  (10 mg total) by mouth daily. 11/25/15   [name], MD  LORazepam (ATIVAN) 0.5 MG tablet Take 1 tablet (0.5 mg total) by mouth 3 (three) times daily as needed for anxiety. Reported on 11/02/2015 Patient taking differently: Take 0.25-0.5 mg by mouth 3 (three) times daily as needed for anxiety. Reported on 11/02/2015 06/20/16   [name], MD  LYRICA 100 MG capsule Take ONE (1)  capsule by mouth three times daily (morning,afternoon and evening) and 2 capsules at bedtime Patient taking differently: Take 100 mg by mouth three times a day (morning/afternoon/evening) and 200 mg at bedtime 05/25/16   [name], MD  Magnesium Oxide 400 (240 Mg) MG TABS Take ONE (1) tablet by mouth twice daily 08/09/16   [name], MD  methylPREDNISolone sodium succinate (SOLU-MEDROL) 40 mg/mL injection Inject 0.5 mLs (20 mg total) into the vein every 12 (twelve) hours. " dispense 21G x 1" needle, 89ml syringes for IV Solumedrol administration at home." 07/31/16 08/10/16  Barton Dubois, MD  midodrine (PROAMATINE) 10 MG tablet Take 2 tablets (20 mg total) by mouth 3 (three) times daily with meals. Patient taking differently: Take 40 mg by mouth daily as needed.  10/28/15   [name], MD  Multiple Vitamin (MULTIVITAMIN WITH MINERALS) TABS tablet Place 1 tablet into feeding tube daily. 06/01/15   Erick Colace, NP  mupirocin ointment (BACTROBAN) 2 % Apply to affected area twice a day as needed 06/19/16   [name], MD  ondansetron (ZOFRAN ODT) 8 MG disintegrating tablet Place 1 tablet (8 mg total) into feeding tube every 8 (eight) hours as needed for nausea or vomiting. 06/01/15   Erick Colace, NP  oxybutynin (  DITROPAN) 5 MG tablet Place 1 tablet (5 mg total) into feeding tube 2 (two) times daily. 07/31/16   Barton Dubois, MD  Oxycodone HCl 20 MG TABS Take 1 tablet (20 mg total) by mouth every 6 (six) hours as needed (pain). 07/11/16   [name], MD  saccharomyces boulardii (FLORASTOR) 250 MG capsule Take 1  capsule (250 mg total) by mouth 2 (two) times daily. Patient not taking: Reported on 07/24/2016 02/20/16   [name], MD  UNABLE TO FIND AQUACEL: Apply to feet and toes daily    Historical Provider, MD  VOLTAREN 1 % GEL apply 2 grams topically twice a day Patient taking differently: Apply 2 grams topically twice a day as needed for shoulder pain 04/24/16   [name], MD  Water For Irrigation, Sterile (FREE WATER) SOLN Place 200 mLs into feeding tube every 8 (eight) hours. Patient taking differently: Place 300-400 mLs into feeding tube every 8 (eight) hours.  10/11/15   [name], MD    Family History Family History  Problem Relation Age of Onset  . Diabetes Father   . Hypertension Father   . Heart disease Mother     congenital - MVP  . Asthma      fhx  . Hypertension      fhx  . Stroke      fhx    Social History Social History  Substance Use Topics  . Smoking status: Former Smoker    Packs/day: 1.00    Years: 12.00    Types: Cigars, Cigarettes    Quit date: 09/07/2014  . Smokeless tobacco: Never Used  . Alcohol use No     Allergies   Amikacin sulfate; Cefuroxime axetil; Ertapenem; Morphine and related; Nsaids; Penicillins; Sulfa antibiotics; Tessalon [benzonatate]; Levaquin [levofloxacin in d5w]; Shellfish allergy; and Miripirium   Review of Systems Review of Systems  Constitutional: Positive for fever.  HENT: Negative for congestion.   Respiratory: Negative for cough.   Cardiovascular: Negative for chest pain.  Gastrointestinal: Negative for diarrhea and vomiting.  Neurological: Negative for headaches.  All other systems reviewed and are negative.    Physical Exam Updated Vital Signs BP 109/76 (BP Location: Left Arm)   Pulse 78   Temp 100.7 F (38.2 C) (Oral)   Resp 10   Ht 5\' 8"  (1.727 m)   Wt 54.9 kg   SpO2 100%   BMI 18.40 kg/m   Physical Exam  Constitutional: He is oriented to person, place, and time. No distress.  HENT:  Head:  Normocephalic.  Eyes: Conjunctivae are normal.  Neck: Neck supple.  Cardiovascular: Normal rate and regular rhythm.   Pulmonary/Chest: Effort normal.  Course rhonchi in bilateral bases  Abdominal: Soft. He exhibits no distension. There is no guarding.  Left colostomy in place  Suprapubic catheter in place with slight erythema noted.   Soft abdomen without tenderness.   Musculoskeletal: Normal range of motion.  Neurological: He is alert and oriented to person, place, and time.  Skin: Skin is warm. Capillary refill takes less than 2 seconds. He is not diaphoretic.  Large Stave 4 sacral wound  Large Stage 4 bilateral ischial wound  Psychiatric: He has a normal mood and affect. His behavior is normal.  Nursing note and vitals reviewed.  ED Treatments / Results  Labs (all labs ordered are listed, but only abnormal results are displayed) Labs Reviewed  COMPREHENSIVE METABOLIC PANEL - Abnormal; Notable for the following:  Result Value   Sodium 131 (*)    CO2 20 (*)    Albumin 1.8 (*)    AST 13 (*)    ALT 8 (*)    All other components within normal limits  CBC WITH DIFFERENTIAL/PLATELET - Abnormal; Notable for the following:    WBC 24.2 (*)    RBC 2.68 (*)    Hemoglobin 7.0 (*)    HCT 22.5 (*)    RDW 17.5 (*)    Platelets 474 (*)    All other components within normal limits  URINALYSIS, ROUTINE W REFLEX MICROSCOPIC (NOT AT [facility]) - Abnormal; Notable for the following:    Hgb urine dipstick SMALL (*)    Protein, ur 100 (*)    Leukocytes, UA TRACE (*)    All other components within normal limits  URINE MICROSCOPIC-ADD ON - Abnormal; Notable for the following:    Squamous Epithelial / LPF 0-5 (*)    Bacteria, UA FEW (*)    All other components within normal limits  I-STAT CG4 LACTIC ACID, ED - Abnormal; Notable for the following:    Lactic Acid, Venous <0.30 (*)    All other components within normal limits  CULTURE, BLOOD (ROUTINE X 2)  CULTURE, BLOOD (ROUTINE X 2)    URINE CULTURE  I-STAT CG4 LACTIC ACID, ED   Radiology No results found.  Procedures Procedures (including critical care time)  Medications Ordered in ED Medications  acetaminophen (TYLENOL) tablet 650 mg (not administered)  albuterol (PROVENTIL) (2.5 MG/3ML) 0.083% nebulizer solution 2.5 mg (not administered)  baclofen (LIORESAL) tablet 20 mg (not administered)  bisacodyl (DULCOLAX) EC tablet 5 mg (not administered)  buPROPion (WELLBUTRIN XL) 24 hr tablet 150 mg (not administered)  fentaNYL (DURAGESIC - dosed mcg/hr) patch 75 mcg (not administered)  HYDROmorphone (DILAUDID) tablet 8 mg (not administered)  free water 300-400 mL (not administered)  diclofenac sodium (VOLTAREN) 1 % transdermal gel 2 g (not administered)  oxybutynin (DITROPAN) tablet 5 mg (not administered)  ondansetron (ZOFRAN-ODT) disintegrating tablet 8 mg (not administered)  mupirocin ointment (BACTROBAN) 2 % (not administered)  multivitamin with minerals tablet 1 tablet (not administered)  midodrine (PROAMATINE) tablet 20 mg (not administered)  glucagon injection 1 mg (not administered)  GLUCERNA (GLUCERNA) liquid 1,000 mL (not administered)  insulin aspart (NOVOLOG) FlexPen 0-10 Units (not administered)  insulin glargine (LANTUS) injection 22 Units (not administered)  loratadine (CLARITIN) tablet 10 mg (not administered)  pregabalin (LYRICA) capsule 100-200 mg (not administered)  Magnesium Oxide TABS (not administered)  oxyCODONE (Oxy IR/ROXICODONE) immediate release tablet 20 mg (not administered)  pantoprazole (PROTONIX) EC tablet 80 mg (not administered)  ceFEPIme (MAXIPIME) 2 g in dextrose 5 % 50 mL IVPB (not administered)  camphor-menthol (SARNA) lotion (not administered)  docusate (COLACE) 50 MG/5ML liquid 100 mg (not administered)  famotidine (PEPCID) tablet 20 mg (not administered)  ferrous sulfate tablet 325 mg (not administered)  LORazepam (ATIVAN) tablet 0.25-0.5 mg (not administered)   enoxaparin (LOVENOX) injection 40 mg (not administered)  methylPREDNISolone sodium succinate (SOLU-MEDROL) 40 mg/mL injection 20 mg (not administered)  sodium chloride 0.9 % bolus 1,000 mL (0 mLs Intravenous Stopped 08/10/16 0011)  methylPREDNISolone sodium succinate (SOLU-MEDROL) 40 mg/mL injection 20 mg (20 mg Intravenous Given 08/10/16 0022)    Initial Impression / Assessment and Plan / ED Course  I have reviewed the triage vital signs and the nursing notes.  Pertinent labs & imaging results that were available during my care of the patient were reviewed by me and considered  in my medical decision making (see chart for details).  Clinical Course   Patient has multiple comorbidities and most recently diagnosis and treatment of bacteremia and urinary tract infection with discharge on 08/01/2016 this currently receiving 17 through a PIC line and evaluation at home health nurse who presents to emergency department today with concerns of anemia with home hemoglobin of 6.5. Mother also endorses fever for the past 36 hours as well as slight increase in drainage and odor from his chronic sacral wound.  Mother states that they've been compliant with medications. Patient denies any other sources for infection including no cough congestion runny nose or headache. Suprapubic catheter in place.  Large sacral ulcers are extensive and may be new source of infection given increase in fevers.  Leukocytosis at 24,000. Discussed results with infectious disease, Dr. Graylon Good who agreed with admission.  Tonight patient has blood pressure and 80 and 90 systolic. This is normal for him per mother and per chart review. Lactic acid WNL so believe patient to be safe for floor.   Patient will be admitted to the hospital for possible transfusion with hemoglobin of 7. At this time infectious disease recommends to broaden to vancomycin if patient deteriorates. Otherwise continue cefepime entering cultures.  Discussed  this case with hospitalist who is in agreement with observation admission.  Final Clinical Impressions(s) / ED Diagnoses   Final diagnoses:  Leukocytosis, unspecified type  Sacral decubitus ulcer, stage IV ([place])  Anemia, unspecified type     Roberto Scales, MD 08/10/16 [id number]    Veryl Speak, MD 08/10/16 2315

## 2016-08-09 NOTE — Progress Notes (Signed)
Pharmacy Antibiotic Note  Jason [name] is a 30 y.o. male admitted on 08/09/2016 with anemia. Pt on Cefepime (premedicating with Solumedrol 20mg ) PTA for polymicrobial bacteremia 2/2 UTI.   Per ID note during last admission: Pt was transitioned to Cefepime on 10/19 and will need at least a 2 week course once blood cx negative. Appears that blood cx negative on 10/23 (so would need abx until 11/4).   Pt also with decubitus ulcers/chronic osteo - Per Dr Baxter Flattery continue cefepime for now and may need addition of Vanc if pt decompensates.  Pharmacy has been consulted for continued Cefepime dosing.  Plan: Cefepime 2gm IV q12h Will premedicate with 20mg  solumedrol q12h  Height: 5\' 8"  (172.7 cm) Weight: 121 lb (54.9 kg) IBW/kg (Calculated) : 68.4  Temp (24hrs), Avg:99.3 F (37.4 C), Min:97.9 F (36.6 C), Max:100.7 F (38.2 C)   Recent Labs Lab 08/09/16 2105 08/09/16 2126 08/09/16 2233  WBC 24.2*  --   --   CREATININE 0.62  --   --   LATICACIDVEN  --  <0.30* 0.40*    Estimated Creatinine Clearance: 102.9 mL/min (by C-G formula based on SCr of 0.62 mg/dL).    Allergies  Allergen Reactions  . Amikacin Sulfate Other (See Comments)    Seizure   . Cefuroxime Axetil Anaphylaxis    Tolerated cefepime 12/2015 - with Pepcid/Solu-Medrol  . Ertapenem Other (See Comments)    Rash and confusion-->tolerated Imipenem   . Morphine And Related Other (See Comments)    Changed mental status, confusion, headache, visual hallucination  . Nsaids Itching and Other (See Comments)    Risk of bleeding, itching  . Penicillins Anaphylaxis and Other (See Comments)    Tolerated Imipenem; no reaction to 7 day course of amoxicillin in 2015 Has patient had a PCN reaction causing immediate rash, facial/tongue/throat swelling, SOB or lightheadedness with hypotension: Yes Has patient had a PCN reaction causing severe rash involving mucus membranes or skin necrosis: No Has patient had a PCN reaction that  required hospitalization Yes Has patient had a PCN reaction occurring within the last 10 years: No If all of the above answers are "NO", then may proceed with Cephalo  . Sulfa Antibiotics Anaphylaxis, Shortness Of Breath and Other (See Comments)  . Tessalon [Benzonatate] Anaphylaxis  . Levaquin [Levofloxacin In D5w] Swelling    Hand and forearm swelling  . Shellfish Allergy Itching and Other (See Comments)    Took benadryl to alleviate reaction  . Miripirium Rash and Other (See Comments)    Change in mental status    Antimicrobials this admission: 10/19 (PTA) Cefepime >>   Microbiology results: 11/2 BCx x2:  11/2 UCx:    Thank you for allowing pharmacy to be a part of this patient's care.  Sherlon Handing, PharmD, BCPS Clinical pharmacist, pager [phone] 08/09/2016 11:49 PM

## 2016-08-09 NOTE — H&P (Signed)
History and Physical    Jason [name] [id number] DOB: 1985-11-28 DOA: 08/09/2016   PCP: [name], MD Chief Complaint:  Chief Complaint  Patient presents with  . low hemoglobin    HPI: Jason [name] is a 30 y.o. male with medical history significant of C6 spinal cord injury, chronic trach and peg and ostomy, DM1.  Patient presents to the ED with 2 day history of fever.  Fever last night and Tm was 100.7 on arrival to ED.  Additionally Dr. Baxter Flattery spoke with patient and recommended that they come in to ED because blood work at home this morning (drawn by wound care) showed HGB of 6.5.  There is concern that infection is located in his sacral and ischial decubitus ulcers.  These fevers occur despite being on cefepime via a PICC line.  ED Course: HGB 7.0 (was 7.3 at time of discharge on 10/25).  WBC 24k (down from 35k at time of discharge).  Tm 100.7.  Review of Systems: As per HPI otherwise 10 point review of systems negative.    Past Medical History:  Diagnosis Date  . Anemia   . Anxiety   . Arthritis   . Asthma   . 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 Colostomy in place [facility])   . Complication of anesthesia    difficulty waking up, will have sudden drop in BP and O2 sats  . Depression   . Diabetic neuropathy ([place])   . Diabetic retinopathy ([place])   . Dysrhythmia    usually due to medications  . Family history of anesthesia complication    Pt mother can't have epidural procedures  . Fibromyalgia   . Gastroparesis   . GERD (gastroesophageal reflux disease)   . H/O constipation   . H/O respiratory failure   . H/O thrombosis   . Hx MRSA infection    on face  . Multiple allergies 11/02/2015  . Pneumonia   . Recurrent UTI 11/02/2015  . Seizures ([place]) 2017   one time during admission in hosptital  . Stroke ([place]) 01/29/2013   spinal stroke ; paraplegic   . Syncope 02/16/2015  . Tracheostomy present ([place])   . Type I diabetes  mellitus ([place])    sees Dr. Loanne Drilling     Past Surgical History:  Procedure Laterality Date  . APPLICATION OF A-CELL OF BACK N/A 11/12/2014   Procedure: APPLICATION A CELL AND VAC ;  Surgeon: Theodoro Kos, DO;  Location: [place];  Service: Plastics;  Laterality: N/A;  . APPLICATION OF A-CELL OF BACK N/A 12/08/2014   Procedure: APPLICATION OF A-CELL AND WOUND VAC ;  Surgeon: Theodoro Kos, DO;  Location: [place];  Service: Plastics;  Laterality: N/A;  . APPLICATION OF A-CELL OF CHEST/ABDOMEN N/A 08/04/2015   Procedure: APPLICATION OF A-CELL ;  Surgeon: Loel Lofty Dillingham, DO;  Location: [employer] [employer];  Service: Plastics;  Laterality: N/A;  . COLOSTOMY    . DEBRIDMENT OF DECUBITUS ULCER N/A 10/04/2014   Procedure: DEBRIDMENT OF DECUBITUS ULCER;  Surgeon: Georganna Skeans, MD;  Location: [place];  Service: General;  Laterality: N/A;  . GASTROSTOMY TUBE PLACEMENT    . HEMATOMA EVACUATION N/A 05/05/2015   Procedure: EVACUATION HEMATOMA bedside procedure;  Surgeon: Theodoro Kos, DO;  Location: [place];  Service: Plastics;  Laterality: N/A;  . INCISION AND DRAINAGE OF WOUND N/A 11/12/2014   Procedure: IRRIGATION AND DEBRIDEMENT OF WOUNDS WITH BONE BIOPSY AND SURGICAL PREP ;  Surgeon: Theodoro Kos, DO;  Location: [place];  Service: Plastics;  Laterality: N/A;  . INCISION AND DRAINAGE OF WOUND N/A 11/18/2014   Procedure: IRRIGATION AND DEBRIDEMENT OF SACRAL ULCER ONLY WITH PLACEMENT OF A CELL AND VAC/ DRESSING CHANGE TO UPPER BACK AREA.;  Surgeon: Theodoro Kos, DO;  Location: [place];  Service: Plastics;  Laterality: N/A;  . INCISION AND DRAINAGE OF WOUND N/A 11/25/2014   Procedure: IRRIGATION AND DEBRIDEMENT OF SACRAL ULCER AND BACK BURN WITH PLACEMENT OF A-CELL;  Surgeon: Theodoro Kos, DO;  Location: [place];  Service: Plastics;  Laterality: N/A;  . INCISION AND DRAINAGE OF WOUND Right 12/08/2014   Procedure: IRRIGATION AND DEBRIDEMENT SACRAL WOUND AND RIGHT ISCHIAL WOUND ;  Surgeon: Theodoro Kos, DO;  Location: [place];  Service:  Plastics;  Laterality: Right;  . INCISION AND DRAINAGE OF WOUND Bilateral 05/05/2015   Procedure: IRRIGATION AND DEBRIDEMENT SACRAL ULCER;  Surgeon: Theodoro Kos, DO;  Location: [place];  Service: Plastics;  Laterality: Bilateral;  . INCISION AND DRAINAGE OF WOUND N/A 08/04/2015   Procedure: IRRIGATION AND DEBRIDEMENT OF SACRAL ULCER AND ;  Surgeon: Loel Lofty Dillingham, DO;  Location: [place];  Service: Plastics;  Laterality: N/A;  . INSERTION OF SUPRAPUBIC CATHETER N/A 10/12/2014   Procedure: INSERTION OF SUPRAPUBIC CATHETER;  Surgeon: Reece Packer, MD;  Location: [place];  Service: Urology;  Laterality: N/A;  . IR GENERIC HISTORICAL  07/11/2016   IR CM INJ ANY COLONIC TUBE W/FLUORO 07/11/2016 Arne Cleveland, MD [facility]-INTERV RAD  . LAPAROSCOPIC DIVERTED COLOSTOMY N/A 10/12/2014   Procedure: LAPAROSCOPIC DIVERTING COLOSTOMY;  Surgeon: Donnie Mesa, MD;  Location: [place];  Service: General;  Laterality: N/A;  . MINOR APPLICATION OF WOUND VAC N/A 11/25/2014   Procedure:  WOUND VAC CHANGE;  Surgeon: Theodoro Kos, DO;  Location: [place];  Service: Plastics;  Laterality: N/A;  . MULTIPLE EXTRACTIONS WITH ALVEOLOPLASTY N/A 08/03/2014   Procedure: MULTIPLE EXTRACTIONS;  Surgeon: Gae Bon, DDS;  Location: [place];  Service: Oral Surgery;  Laterality: N/A;  . RADIOLOGY WITH ANESTHESIA N/A 07/05/2016   Procedure: RADIOLOGY WITH ANESTHESIA MRI OF PELVIS WITH AND WITHOUT;  Surgeon: Medication Radiologist, MD;  Location: [place];  Service: Radiology;  Laterality: N/A;  . TEE WITHOUT CARDIOVERSION N/A 08/17/2014   Procedure: TRANSESOPHAGEAL ECHOCARDIOGRAM (TEE);  Surgeon: Dorothy Spark, MD;  Location: [place];  Service: Cardiovascular;  Laterality: N/A;  . TEE WITHOUT CARDIOVERSION N/A 05/05/2015   Procedure: TRANSESOPHAGEAL ECHOCARDIOGRAM (TEE);  Surgeon: Lelon Perla, MD;  Location: [facility] ENDOSCOPY;  Service: Cardiovascular;  Laterality: N/A;  . TONSILLECTOMY       reports that he quit smoking about 23  months ago. His smoking use included Cigars and Cigarettes. He has a 12.00 pack-year smoking history. He has never used smokeless tobacco. He reports that he does not drink alcohol or use drugs.  Allergies  Allergen Reactions  . Amikacin Sulfate Other (See Comments)    Seizure   . Cefuroxime Axetil Anaphylaxis    Tolerated cefepime 12/2015 - with Pepcid/Solu-Medrol  . Ertapenem Other (See Comments)    Rash and confusion-->tolerated Imipenem   . Morphine And Related Other (See Comments)    Changed mental status, confusion, headache, visual hallucination  . Nsaids Itching and Other (See Comments)    Risk of bleeding, itching  . Penicillins Anaphylaxis and Other (See Comments)    Tolerated Imipenem; no reaction to 7 day course of amoxicillin in 2015 Has patient had a PCN reaction causing immediate rash, facial/tongue/throat swelling, SOB or  lightheadedness with hypotension: Yes Has patient had a PCN reaction causing severe rash involving mucus membranes or skin necrosis: No Has patient had a PCN reaction that required hospitalization Yes Has patient had a PCN reaction occurring within the last 10 years: No If all of the above answers are "NO", then may proceed with Cephalo  . Sulfa Antibiotics Anaphylaxis, Shortness Of Breath and Other (See Comments)  . Tessalon [Benzonatate] Anaphylaxis  . Levaquin [Levofloxacin In D5w] Swelling    Hand and forearm swelling  . Shellfish Allergy Itching and Other (See Comments)    Took benadryl to alleviate reaction  . Miripirium Rash and Other (See Comments)    Change in mental status    Family History  Problem Relation Age of Onset  . Diabetes Father   . Hypertension Father   . Heart disease Mother     congenital - MVP  . Asthma      fhx  . Hypertension      fhx  . Stroke      fhx      Prior to Admission medications   Medication Sig Start Date End Date Taking? Authorizing Provider  acetaminophen (TYLENOL) 325 MG tablet Take 2 tablets  (650 mg total) by mouth every 6 (six) hours as needed for mild pain, moderate pain, fever or headache. 01/14/15  Yes Jessica U Vann, DO  albuterol (PROVENTIL) (2.5 MG/3ML) 0.083% nebulizer solution Take 3 mLs (2.5 mg total) by nebulization 4 (four) times daily. 08/03/16  Yes [name], MD  baclofen (LIORESAL) 20 MG tablet Take 1 tablet (20 mg total) by mouth 4 (four) times daily. 06/26/16  Yes [name], MD  BISACODYL 5 MG EC tablet Take ONE (1) tablet by mouth twice daily as needed FOR MODERATE CONSTIPATION 06/19/16  Yes [name], MD  buPROPion (WELLBUTRIN XL) 150 MG 24 hr tablet Take 1 tablet (150 mg total) by mouth daily. 06/15/16  Yes [name], MD  camphor-menthol [facility]) lotion Apply topically as needed for itching. 09/02/15  Yes Shanker Kristeen Mans, MD  ceFEPIme 2 g in dextrose 5 % 50 mL Inject 2 g into the vein every 12 (twelve) hours. 07/31/16 08/10/16 Yes Barton Dubois, MD  dexlansoprazole (DEXILANT) 60 MG capsule Take 1 capsule (60 mg total) by mouth daily. 03/14/16  Yes [name], MD  docusate (COLACE) 50 MG/5ML liquid Take 10 mLs (100 mg total) by mouth 2 (two) times daily. Patient taking differently: Take 100 mg by mouth daily.  11/10/15  Yes [name], MD  famotidine (PEPCID) 20 MG tablet Take 1 tablet (20 mg total) by mouth 2 (two) times daily. Take around antibiotics infusion time 07/31/16  Yes Barton Dubois, MD  fentaNYL (DURAGESIC - DOSED MCG/HR) 75 MCG/HR place 1 patch (19mcg total) onto the skin every 3 (three) days 07/11/16  Yes [name], MD  ferrous sulfate 325 (65 FE) MG tablet Take 1 tablet (325 mg total) by mouth daily with breakfast. 09/27/15  Yes [name], MD  glucagon (GLUCAGON EMERGENCY) 1 MG injection INJECT INTO MUSCLE ONCE AS NEEDED Patient taking differently: Inject 1 mg into the muscle once as needed (hypoglycemia). INJECT INTO MUSCLE ONCE AS NEEDED 07/05/15  Yes Renato Shin, MD  GLUCERNA (GLUCERNA) LIQD 1,000 mLs by PEG Tube route continuous. 70  ml/hr   Yes Historical Provider, MD  HYDROmorphone (DILAUDID) 8 MG tablet Take ONE (1) tablet (8mg  total) by mouth every 6 hours as needed for severe pain  Patient taking differently: Take 8 mg by mouth every 6 (six) hours as needed for severe pain.  07/11/16  Yes [name], MD  insulin aspart (NOVOLOG FLEXPEN) 100 UNIT/ML FlexPen Inject 0-10 Units into the skin 4 (four) times daily as needed for high blood sugar (CBG >200). Per sliding scale: CBG <200 = 0 units, 201-300 = 4 units, 301-400 = 6 units, >400 = 10 units   Yes Historical Provider, MD  insulin glargine (LANTUS) 100 UNIT/ML injection Inject 0.22 mLs (22 Units total) into the skin at bedtime. 07/31/16  Yes Barton Dubois, MD  loratadine (CLARITIN) 10 MG tablet Take 1 tablet (10 mg total) by mouth daily. 11/25/15  Yes [name], MD  LORazepam (ATIVAN) 0.5 MG tablet Take 1 tablet (0.5 mg total) by mouth 3 (three) times daily as needed for anxiety. Reported on 11/02/2015 Patient taking differently: Take 0.25-0.5 mg by mouth 3 (three) times daily as needed for anxiety. Reported on 11/02/2015 06/20/16  Yes [name], MD  LYRICA 100 MG capsule Take ONE (1)  capsule by mouth three times daily (morning,afternoon and evening) and 2 capsules at bedtime Patient taking differently: Take 100 mg by mouth three times a day (morning/afternoon/evening) and 200 mg at bedtime 05/25/16  Yes [name], MD  Magnesium Oxide 400 (240 Mg) MG TABS Take ONE (1) tablet by mouth twice daily 08/09/16  Yes [name], MD  methylPREDNISolone sodium succinate (SOLU-MEDROL) 40 mg/mL injection Inject 0.5 mLs (20 mg total) into the vein every 12 (twelve) hours. " dispense 21G x 1" needle, 66ml syringes for IV Solumedrol administration at home." 07/31/16 08/10/16 Yes Barton Dubois, MD  midodrine (PROAMATINE) 10 MG tablet Take 2 tablets (20 mg total) by mouth 3 (three) times daily with meals. Patient taking differently: Take 40 mg by mouth daily as needed.  10/28/15  Yes  [name], MD  Multiple Vitamin (MULTIVITAMIN WITH MINERALS) TABS tablet Place 1 tablet into feeding tube daily. 06/01/15  Yes Erick Colace, NP  mupirocin ointment (BACTROBAN) 2 % Apply to affected area twice a day as needed 06/19/16  Yes [name], MD  ondansetron (ZOFRAN ODT) 8 MG disintegrating tablet Place 1 tablet (8 mg total) into feeding tube every 8 (eight) hours as needed for nausea or vomiting. 06/01/15  Yes Erick Colace, NP  oxybutynin (DITROPAN) 5 MG tablet Place 1 tablet (5 mg total) into feeding tube 2 (two) times daily. 07/31/16  Yes Barton Dubois, MD  Oxycodone HCl 20 MG TABS Take 1 tablet (20 mg total) by mouth every 6 (six) hours as needed (pain). 07/11/16  Yes [name], MD  UNABLE TO FIND AQUACEL: Apply to feet and toes daily   Yes Historical Provider, MD  VOLTAREN 1 % GEL apply 2 grams topically twice a day Patient taking differently: Apply 2 grams topically twice a day as needed for shoulder pain 04/24/16  Yes [name], MD  Water For Irrigation, Sterile (FREE WATER) SOLN Place 200 mLs into feeding tube every 8 (eight) hours. Patient taking differently: Place 300-400 mLs into feeding tube every 8 (eight) hours.  10/11/15  Yes [name], MD  hydrocortisone (CORTEF) 10 MG tablet Take 1 tablet (10 mg total) by mouth every evening. Hold for the next 10 days, while using solumedrol. Then resume as previously instructed 07/31/16   Barton Dubois, MD  hydrocortisone (CORTEF) 20 MG tablet Take 1 tablet (20 mg total) by mouth every morning. Hold  for the next 10 days, while on solumedrol. Resume after this time as previously 07/31/16   Barton Dubois, MD    Physical Exam: Vitals:   08/09/16 2100 08/09/16 2145 08/09/16 2200 08/09/16 2300  BP: 107/71 112/74 109/76 93/69  Pulse: 80 81 78 78  Resp: 13 13 10 12   Temp:    97.9 F (36.6 C)  TempSrc:    Oral  SpO2: 100% 100% 100% 100%  Weight:      Height:          Constitutional: NAD, calm, comfortable Eyes:  PERRL, lids and conjunctivae normal ENMT: Mucous membranes are moist. Posterior pharynx clear of any exudate or lesions.Normal dentition.  Neck: normal, supple, no masses, no thyromegaly Respiratory: clear to auscultation bilaterally, no wheezing, no crackles. Normal respiratory effort. No accessory muscle use.  Cardiovascular: Regular rate and rhythm, no murmurs / rubs / gallops. No extremity edema. 2+ pedal pulses. No carotid bruits.  Abdomen: no tenderness, no masses palpated. No hepatosplenomegaly. Bowel sounds positive.  Musculoskeletal: no clubbing / cyanosis. No joint deformity upper and lower extremities. Good ROM, no contractures. Normal muscle tone.  Skin: Stage 4 decubiti on B ischia and sacrum. Neurologic: CN 2-12 grossly intact. Sensation intact, DTR normal. Strength 5/5 in all 4.  Psychiatric: Normal judgment and insight. Alert and oriented x 3. Normal mood.    Labs on Admission: I have personally reviewed following labs and imaging studies  CBC:  Recent Labs Lab 08/09/16 2105  WBC 24.2*  NEUTROABS 19.6*  HGB 7.0*  HCT 22.5*  MCV 84.0  PLT 123XX123*   Basic Metabolic Panel:  Recent Labs Lab 08/09/16 2105  NA 131*  K 4.0  CL 104  CO2 20*  GLUCOSE 81  BUN 19  CREATININE 0.62  CALCIUM 9.2   GFR: Estimated Creatinine Clearance: 102.9 mL/min (by C-G formula based on SCr of 0.62 mg/dL). Liver Function Tests:  Recent Labs Lab 08/09/16 2105  AST 13*  ALT 8*  ALKPHOS 110  BILITOT 0.5  PROT 6.5  ALBUMIN 1.8*   No results for input(s): LIPASE, AMYLASE in the last 168 hours. No results for input(s): AMMONIA in the last 168 hours. Coagulation Profile: No results for input(s): INR, PROTIME in the last 168 hours. Cardiac Enzymes: No results for input(s): CKTOTAL, CKMB, CKMBINDEX, TROPONINI in the last 168 hours. BNP (last 3 results) No results for input(s): PROBNP in the last 8760 hours. HbA1C: No results for input(s): HGBA1C in the last 72 hours. CBG: No  results for input(s): GLUCAP in the last 168 hours. Lipid Profile: No results for input(s): CHOL, HDL, LDLCALC, TRIG, CHOLHDL, LDLDIRECT in the last 72 hours. Thyroid Function Tests: No results for input(s): TSH, T4TOTAL, FREET4, T3FREE, THYROIDAB in the last 72 hours. Anemia Panel: No results for input(s): VITAMINB12, FOLATE, FERRITIN, TIBC, IRON, RETICCTPCT in the last 72 hours. Urine analysis:    Component Value Date/Time   COLORURINE YELLOW 08/09/2016 2105   APPEARANCEUR CLEAR 08/09/2016 2105   LABSPEC 1.009 08/09/2016 2105   PHURINE 6.5 08/09/2016 2105   GLUCOSEU NEGATIVE 08/09/2016 2105   HGBUR SMALL (A) 08/09/2016 2105   BILIRUBINUR NEGATIVE 08/09/2016 2105   BILIRUBINUR neg 06/28/2014 1445   KETONESUR NEGATIVE 08/09/2016 2105   PROTEINUR 100 (A) 08/09/2016 2105   UROBILINOGEN 0.2 07/27/2015 0645   NITRITE NEGATIVE 08/09/2016 2105   LEUKOCYTESUR TRACE (A) 08/09/2016 2105   Sepsis Labs: @LABRCNTIP (procalcitonin:4,lacticidven:4) )No results found for this or any previous visit (from the past 240 hour(s)).  Radiological Exams on Admission: No results found.  EKG: Independently reviewed.  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 Decubitus ulcers with chronic osteo - 1. Wound care consult 2. Continue cefepime for now per Dr. Baxter Flattery 3. BCx 4. If decompensates add vanc per Dr. Baxter Flattery 2. Chronic hypotension - resume home midodrine 3. DM1 - continue home insulin regimen and CBG checks AC/HS 4. Anemia of chronic disease - 1. Repeat CBC in AM 2. Not transfusing just yet, but may warrant this if HGB decreases  any more 5. Chronic pain - continue home pain meds   DVT prophylaxis: Lovenox Code Status: Full Family Communication: Mother at bedside Consults called: Dr. Baxter Flattery with ID. Admission status: Admit to obs   Etta Quill DO [facility] Pager [phone] from 7PM-7AM  If 7AM-7PM, please contact the day physician for the patient [email or web address] Password [facility]  08/09/2016, 11:41 PM

## 2016-08-09 NOTE — ED Notes (Signed)
Correction..no blood cultures collect by RN.

## 2016-08-09 NOTE — ED Triage Notes (Signed)
Pt is coming from home via POV for low hemoglobin (6.0) and wound check.  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  Pt receives IV antibiotics at home and a home health nurse draw blood this morning and found out that his hemoglobin was low. Pt has multiple bed sores that need to be checked.

## 2016-08-10 DIAGNOSIS — Z881 Allergy status to other antibiotic agents status: Secondary | ICD-10-CM

## 2016-08-10 DIAGNOSIS — Z930 Tracheostomy status: Secondary | ICD-10-CM

## 2016-08-10 DIAGNOSIS — G9340 Encephalopathy, unspecified: Secondary | ICD-10-CM | POA: Diagnosis not present

## 2016-08-10 DIAGNOSIS — G8220 Paraplegia, unspecified: Secondary | ICD-10-CM | POA: Diagnosis not present

## 2016-08-10 DIAGNOSIS — D72829 Elevated white blood cell count, unspecified: Secondary | ICD-10-CM | POA: Diagnosis not present

## 2016-08-10 DIAGNOSIS — R4182 Altered mental status, unspecified: Secondary | ICD-10-CM | POA: Diagnosis not present

## 2016-08-10 DIAGNOSIS — Z825 Family history of asthma and other chronic lower respiratory diseases: Secondary | ICD-10-CM

## 2016-08-10 DIAGNOSIS — L89154 Pressure ulcer of sacral region, stage 4: Secondary | ICD-10-CM | POA: Diagnosis not present

## 2016-08-10 DIAGNOSIS — R509 Fever, unspecified: Secondary | ICD-10-CM | POA: Diagnosis not present

## 2016-08-10 DIAGNOSIS — Z833 Family history of diabetes mellitus: Secondary | ICD-10-CM

## 2016-08-10 DIAGNOSIS — Z885 Allergy status to narcotic agent status: Secondary | ICD-10-CM

## 2016-08-10 DIAGNOSIS — Z882 Allergy status to sulfonamides status: Secondary | ICD-10-CM

## 2016-08-10 DIAGNOSIS — Z8739 Personal history of other diseases of the musculoskeletal system and connective tissue: Secondary | ICD-10-CM

## 2016-08-10 DIAGNOSIS — Z886 Allergy status to analgesic agent status: Secondary | ICD-10-CM

## 2016-08-10 DIAGNOSIS — Z87891 Personal history of nicotine dependence: Secondary | ICD-10-CM

## 2016-08-10 DIAGNOSIS — J984 Other disorders of lung: Secondary | ICD-10-CM | POA: Diagnosis not present

## 2016-08-10 DIAGNOSIS — I9589 Other hypotension: Secondary | ICD-10-CM | POA: Diagnosis not present

## 2016-08-10 DIAGNOSIS — Z888 Allergy status to other drugs, medicaments and biological substances status: Secondary | ICD-10-CM

## 2016-08-10 DIAGNOSIS — Z8249 Family history of ischemic heart disease and other diseases of the circulatory system: Secondary | ICD-10-CM

## 2016-08-10 DIAGNOSIS — Z91013 Allergy to seafood: Secondary | ICD-10-CM

## 2016-08-10 DIAGNOSIS — M86651 Other chronic osteomyelitis, right thigh: Secondary | ICD-10-CM | POA: Diagnosis not present

## 2016-08-10 DIAGNOSIS — Z823 Family history of stroke: Secondary | ICD-10-CM

## 2016-08-10 DIAGNOSIS — D638 Anemia in other chronic diseases classified elsewhere: Secondary | ICD-10-CM | POA: Diagnosis not present

## 2016-08-10 DIAGNOSIS — Z8619 Personal history of other infectious and parasitic diseases: Secondary | ICD-10-CM | POA: Diagnosis not present

## 2016-08-10 DIAGNOSIS — E162 Hypoglycemia, unspecified: Secondary | ICD-10-CM | POA: Diagnosis not present

## 2016-08-10 DIAGNOSIS — E108 Type 1 diabetes mellitus with unspecified complications: Secondary | ICD-10-CM | POA: Diagnosis not present

## 2016-08-10 DIAGNOSIS — J690 Pneumonitis due to inhalation of food and vomit: Secondary | ICD-10-CM | POA: Diagnosis not present

## 2016-08-10 LAB — CBC
HEMATOCRIT: 22.1 % — AB (ref 39.0–52.0)
Hemoglobin: 6.7 g/dL — CL (ref 13.0–17.0)
MCH: 25.8 pg — ABNORMAL LOW (ref 26.0–34.0)
MCHC: 30.3 g/dL (ref 30.0–36.0)
MCV: 85 fL (ref 78.0–100.0)
PLATELETS: 444 10*3/uL — AB (ref 150–400)
RBC: 2.6 MIL/uL — ABNORMAL LOW (ref 4.22–5.81)
RDW: 17.6 % — AB (ref 11.5–15.5)
WBC: 25.2 10*3/uL — AB (ref 4.0–10.5)

## 2016-08-10 LAB — PREPARE RBC (CROSSMATCH)

## 2016-08-10 LAB — GLUCOSE, CAPILLARY
GLUCOSE-CAPILLARY: 214 mg/dL — AB (ref 65–99)
GLUCOSE-CAPILLARY: 222 mg/dL — AB (ref 65–99)
Glucose-Capillary: 84 mg/dL (ref 65–99)
Glucose-Capillary: 154 mg/dL — ABNORMAL HIGH (ref 65–99)

## 2016-08-10 MED ORDER — SODIUM CHLORIDE 0.9% FLUSH
10.0000 mL | Freq: Two times a day (BID) | INTRAVENOUS | Status: DC
  Administered 2016-08-12 – 2016-08-16 (×6): 10 mL
  Administered 2016-08-17: 20 mL
  Administered 2016-08-18: 10 mL

## 2016-08-10 MED ORDER — ALBUTEROL SULFATE (2.5 MG/3ML) 0.083% IN NEBU
2.5000 mg | INHALATION_SOLUTION | Freq: Four times a day (QID) | RESPIRATORY_TRACT | Status: DC
  Administered 2016-08-10 (×2): 2.5 mg via RESPIRATORY_TRACT
  Filled 2016-08-10 (×3): qty 3

## 2016-08-10 MED ORDER — JEVITY 1.2 CAL PO LIQD: 1000.0000 mL | ORAL | Status: DC

## 2016-08-10 MED ORDER — SODIUM CHLORIDE 0.9 % IV SOLN
Freq: Once | INTRAVENOUS | Status: AC
  Administered 2016-08-10: 09:00:00 via INTRAVENOUS

## 2016-08-10 MED ORDER — PREGABALIN 100 MG PO CAPS
100.0000 mg | ORAL_CAPSULE | Freq: Three times a day (TID) | ORAL | Status: DC
  Administered 2016-08-10 – 2016-08-18 (×25): 100 mg via ORAL
  Filled 2016-08-10 (×25): qty 1

## 2016-08-10 MED ORDER — SODIUM CHLORIDE 0.9% FLUSH
10.0000 mL | INTRAVENOUS | Status: DC | PRN
  Administered 2016-08-18 (×2): 10 mL
  Filled 2016-08-10 (×2): qty 40

## 2016-08-10 MED ORDER — GLUCERNA 1.2 CAL PO LIQD
1000.0000 mL | ORAL | Status: DC
  Administered 2016-08-10 – 2016-08-18 (×8): 1000 mL
  Filled 2016-08-10 (×17): qty 1000

## 2016-08-10 MED ORDER — SODIUM CHLORIDE 0.9 % IV SOLN: Freq: Once | INTRAVENOUS | Status: DC

## 2016-08-10 NOTE — Progress Notes (Signed)
Results for JAILIN, CARGILE (MRN [id number]) as of 08/10/2016 06:11  Ref. Range 08/10/2016 04:00  Hemoglobin Latest Ref Range: 13.0 - 17.0 g/dL 6.7 (LL)  NP on call paged awaiting return call.

## 2016-08-10 NOTE — Consult Note (Signed)
[place] for Infectious Disease       Reason for Consult: ? Antibiotic duration    Referring Physician: Dr. 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 sodium chloride   Intravenous Once  . sodium chloride   Intravenous Once  . albuterol  2.5 mg Nebulization Q6H  . baclofen  20 mg Oral QID  . buPROPion  150 mg Oral Daily  . ceFEPIme (MAXIPIME) 2 GM IVP  2 g Intravenous Q12H  . diclofenac sodium  2 g Topical BID  . docusate  100 mg Oral Daily  . enoxaparin (LOVENOX) injection  40 mg Subcutaneous Q24H  . famotidine  20 mg Oral BID  . [START ON 08/11/2016] fentaNYL  75 mcg Transdermal Q72H  . ferrous sulfate  325 mg Oral Q breakfast  . free water  300-400 mL Per Tube Q8H  . insulin glargine  22 Units Subcutaneous QHS  . loratadine  10 mg Oral Daily  . magnesium oxide  400 mg Per Tube BID  . methylPREDNISolone (SOLU-MEDROL) injection  20 mg Intravenous Q12H  . midodrine  20 mg Oral TID WC  . multivitamin with minerals  1 tablet Per Tube Daily  . mupirocin ointment   Topical BID  . oxybutynin  5 mg Per Tube BID  . pantoprazole  80 mg Oral Daily  . pregabalin  100 mg Oral TID WC  . pregabalin  200 mg Oral QHS  . sodium chloride flush  10-40 mL Intracatheter Q12H    Recommendations: Stop cefepime  Continue routine care Pull picc line  Thanks for consult  Assessment: He has a chronic ulcer and is improving, seen by [facility] and better from previous.  No acute infection and has been treated in the past for osteomyelitis.  No further antibiotic treatment indicated.  Had a mild fever to 100.7 but none since admission.   WBC up but no new source of infection.  WBC ranges from 20 - 35 typically.  No indication for further antibiotics at this time.   Antibiotics: Cefepime 2 weeks.   HPI: Jason [name] is a 30 y.o. male well known to ID with paragplegia complicated by neurogenic bladder and chronic pressure ulcer stage IV under the care of plastic surgery at [facility] (Dr. Luetta Nutting) who came in at the request of home health due to recent fever two days ago x 1 of 102.  History is from the mom and patient and he was noted to be at his baseline and no concerns but with fever asked for evaluation and sent to ED.  Temp of 100.7 but no fever since.  Has continued on cefepime with duration to end today after recent hospitalization for UTI.  Wound has been improving, has wound VAC, no new drainage, no pus, no surrounding erythema mom has noted.  Patient feels well overall.   Review of Systems:  Constitutional: negative for fevers, chills and anorexia Gastrointestinal: negative for diarrhea Musculoskeletal: negative for myalgias All other systems reviewed and are negative   Past Medical History:  Diagnosis Date  . Anemia   . Anxiety   . Arthritis   .  Asthma   . 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 Colostomy in place [facility])   . Complication of anesthesia    difficulty waking up, will have sudden drop in BP and O2 sats  . Depression   . Diabetic neuropathy ([place])   . Diabetic retinopathy ([place])   . Dysrhythmia    usually due to medications  . Family history of anesthesia complication    Pt mother can't have epidural procedures  . Fibromyalgia   . Gastroparesis   . GERD (gastroesophageal reflux disease)   . H/O constipation   . H/O respiratory failure   . H/O thrombosis   . Hx MRSA infection    on face  . Multiple allergies 11/02/2015  . Pneumonia   . Recurrent UTI 11/02/2015  . Seizures ([place]) 2017   one time during admission in hosptital  . Stroke ([place]) 01/29/2013    spinal stroke ; paraplegic   . Syncope 02/16/2015  . Tracheostomy present ([place])   . Type I diabetes mellitus ([place])    sees Dr. Loanne Drilling     Social History  Substance Use Topics  . Smoking status: Former Smoker    Packs/day: 1.00    Years: 12.00    Types: Cigars, Cigarettes    Quit date: 09/07/2014  . Smokeless tobacco: Never Used  . Alcohol use No    Family History  Problem Relation Age of Onset  . Diabetes Father   . Hypertension Father   . Heart disease Mother     congenital - MVP  . Asthma      fhx  . Hypertension      fhx  . Stroke      fhx    Allergies  Allergen Reactions  . Amikacin Sulfate Other (See Comments)    Seizure   . Cefuroxime Axetil Anaphylaxis    Tolerated cefepime 12/2015 - with Pepcid/Solu-Medrol  . Ertapenem Other (See Comments)    Rash and confusion-->tolerated Imipenem   . Morphine And Related Other (See Comments)    Changed mental status, confusion, headache, visual hallucination  . Nsaids Itching and Other (See Comments)    Risk of bleeding, itching  . Penicillins Anaphylaxis and Other (See Comments)    Tolerated Imipenem; no reaction to 7 day course of amoxicillin in 2015 Has patient had a PCN reaction causing immediate rash, facial/tongue/throat swelling, SOB or lightheadedness with hypotension: Yes Has patient had a PCN reaction causing severe rash involving mucus membranes or skin necrosis: No Has patient had a PCN reaction that required hospitalization Yes Has patient had a PCN reaction occurring within the last 10 years: No If all of the above answers are "NO", then may proceed with Cephalo  . Sulfa Antibiotics Anaphylaxis, Shortness Of Breath and Other (See Comments)  . Tessalon [Benzonatate] Anaphylaxis  . Levaquin [Levofloxacin In D5w] Swelling    Hand and forearm swelling  . Shellfish Allergy Itching and Other (See Comments)    Took benadryl to alleviate reaction  . Miripirium Rash and Other (See Comments)    Change in mental  status    Physical Exam: Constitutional: in no apparent distress; awake, interactive alert and oriented Vitals:   08/10/16 0939 08/10/16 1252  BP:    Pulse: 96 90  Resp: 16 16  Temp:     EYES: anicteric ENMT: + trach  Cardiovascular: Cor RRR Respiratory: CTA b; GI: soft Musculoskeletal: no pedal edema noted Skin: negatives: no rash Hematologic: no cervical lad  Lab  Results  Component Value Date   WBC 25.2 (H) 08/10/2016   HGB 6.7 (LL) 08/10/2016   HCT 22.1 (L) 08/10/2016   MCV 85.0 08/10/2016   PLT 444 (H) 08/10/2016    Lab Results  Component Value Date   CREATININE 0.62 08/09/2016   BUN 19 08/09/2016   NA 131 (L) 08/09/2016   K 4.0 08/09/2016   CL 104 08/09/2016   CO2 20 (L) 08/09/2016    Lab Results  Component Value Date   ALT 8 (L) 08/09/2016   AST 13 (L) 08/09/2016   ALKPHOS 110 08/09/2016     Microbiology: No results found for this or any previous visit (from the past 240 hour(s)).  Scharlene Gloss, [place] for Infectious Disease [place] [email or web address] [phone] pager  [phone] cell 08/10/2016, 2:34 PM

## 2016-08-10 NOTE — Progress Notes (Signed)
Report received from ER RN . Room ready.  

## 2016-08-10 NOTE — Consult Note (Addendum)
[facility] Nurse wound consult: Pt is familiar to [facility] team from recent admission; refer to previous progress notes in October.   Wound type: Mother at bedside states Vac dressings were removed from sacrum and bilat ischium wounds prior to admission, and she will resume use after he returns home.  These 3 chronic stage 4 wounds are all fairly clean, with minimal amt yellow slough, exposed bone palpable with a swab, and mod amt pink drainage with slight odor.  No obvious s/s of infection, wound appearance has actually improved since last admission and he has recently been treated for osteomylitis. Refer to nursing flow sheet for measurements.   Remains with a deep tissue injury to his heal, and healing stage 2 wounds to elbow, feet, and back is full thickness; all these sites are pink and dry. Ostomy pouch intact with good seal, extra supplies at the bedside.  Pt declines offer of air mattress at this time, since he states he plans to discharge home tomorrow. Discussed plan of care with mother and patient. Please re-consult if further assistance is needed.  Thank-you,  Julien Girt MSN, [place], [place], [place], [place]

## 2016-08-10 NOTE — Progress Notes (Signed)
Initial Nutrition Assessment  DOCUMENTATION CODES:   Not applicable  INTERVENTION:  Initiate nocturnal tube feeds: Continue TF regimen as PTA: Glucerna 1.2via PEG at 61ml/h x 12 hours per day  (12 midnight to 12 noon) to provide 1008kcals (provides 58% of kcal needs), 50gm protein (provides 56% of protein needs), 617ml free water daily.   Continue free water flushes of 300-400 ml q 8 hours.   Encourage adequate PO intake.   RD to continue to monitor.  NUTRITION DIAGNOSIS:   Increased nutrient needs related to wound healing as evidenced by estimated needs.  GOAL:   Patient will meet greater than or equal to 90% of their needs  MONITOR:   PO intake, Labs, Weight trends, TF tolerance, Skin, I & O's  REASON FOR ASSESSMENT:   Consult Enteral/tube feeding initiation and management  ASSESSMENT:    30 y.o. male with medical history significant of C6 spinal cord injury, chronic trach and peg and ostomy, DM1.  Patient presents to the ED with 2 day history of fever. There is concern that infection is located in his sacral and ischial decubitus ulcers.    Pt is familiar to the RD team from previous admission. Pt is currently receiving Glucerna 1.2 formula at 70 ml/hr x 24 hours which provides 2016 kcal (100% of needs), 101 grams of protein, and 1368 ml of free water. Pt is also on a regular diet with meal completion 25-75%. Home TF regimen is Glucerna 1.2 at 70 ml/hr x 12 hours. RD to modify orders to continue with home TF regimen. Pt reports eating well at home with usual intake >/= 3 meals a day. Appetite reported good. Per weight records, pt with a 11% weight loss in 1 month, however noted weight does fluctuate. Per RN, pt has trouble with consumption of hard foods (ex. Berniece Salines). Recommend continuation of regular diet and encourage pt to order foods that have a more soft consistency.   Nutrition-Focused physical exam deferred at this time as pt with quadriplegia.   Labs and  medications reviewed.   Diet Order:  Diet regular Room service appropriate? Yes; Fluid consistency: Thin  Skin:  Wound (see comment) (Stg 2 R elbow, back; DTI L heel; stg 4 coccyx, buttocks)  Last BM:  11/3-ostomy  Height:   Ht Readings from Last 1 Encounters:  08/09/16 5\' 8"  (1.727 m)    Weight:   Wt Readings from Last 1 Encounters:  08/10/16 120 lb 13 oz (54.8 kg)    Ideal Body Weight:  63 kg (adjusted for quadriplegia)  BMI:  Body mass index is 18.37 kg/m.  Estimated Nutritional Needs:   Kcal:  1750-2000  Protein:  90-105 grams  Fluid:  2 L  EDUCATION NEEDS:   No education needs identified at this time  Corrin Parker, MS, RD, LDN Pager # [phone] After hours/ weekend pager # [phone]

## 2016-08-10 NOTE — Progress Notes (Signed)
[facility] PROGRESS NOTE  Jason [name] [id number] DOB: 07-05-1986 DOA: 08/09/2016  PCP: [name], MD  Brief History/Interval Summary: 30 year old African-American male who was last hospitalized here about a week ago for sacral wound infection and who has a past medical history of C6 spinal cord injury, chronic trach and PEG and ostomy, diabetes mellitus type 1. Patient presented to the emergency department as his home care nurse was concerned about patient's wound. The patient's hemoglobin was noted to be low and he had a temperature of 100.7. He was hospitalized for further management.  Reason for Visit: Anemia  Consultants: Infectious disease  Procedures: None  Antibiotics: Currently on cefepime  Subjective/Interval History: Patient feels well this morning. Denies any pain. Denies any nausea, vomiting or diarrhea. [name] is at the bedside. She is mainly concerned about his anemia. No overt bleeding has been noted.  ROS: Denies any chest pain or shortness of breath  Objective:  Vital Signs  Vitals:   08/10/16 0213 08/10/16 0305 08/10/16 0519 08/10/16 0939  BP:   (!) 103/58   Pulse: 81 84 87 96  Resp: 18 18 19 16   Temp:   98.6 F (37 C)   TempSrc:   Oral   SpO2: 100% 100% 100% 100%  Weight:      Height:        Intake/Output Summary (Last 24 hours) at 08/10/16 1234 Last data filed at 08/10/16 0700  Gross per 24 hour  Intake           1347.5 ml  Output              350 ml  Net            997.5 ml   Filed Weights   08/09/16 1732 08/10/16 0148  Weight: 54.9 kg (121 lb) 54.8 kg (120 lb 13 oz)    General appearance: alert, cooperative, appears stated age and no distress Head: Normocephalic, without obvious abnormality, atraumatic Resp: clear to auscultation bilaterally Cardio: regular rate and rhythm, S1, S2 normal, no murmur, click, rub or gallop GI: soft, non-tender; bowel sounds normal; no masses,  no organomegaly Extremities:  extremities normal, atraumatic, no cyanosis or edema Neurologic: Quadriplegic  Lab Results:  Data Reviewed: I have personally reviewed following labs and imaging studies  CBC:  Recent Labs Lab 08/09/16 2105 08/10/16 0400  WBC 24.2* 25.2*  NEUTROABS 19.6*  --   HGB 7.0* 6.7*  HCT 22.5* 22.1*  MCV 84.0 85.0  PLT 474* 444*    Basic Metabolic Panel:  Recent Labs Lab 08/09/16 2105  NA 131*  K 4.0  CL 104  CO2 20*  GLUCOSE 81  BUN 19  CREATININE 0.62  CALCIUM 9.2    GFR: Estimated Creatinine Clearance: 102.8 mL/min (by C-G formula based on SCr of 0.62 mg/dL).  Liver Function Tests:  Recent Labs Lab 08/09/16 2105  AST 13*  ALT 8*  ALKPHOS 110  BILITOT 0.5  PROT 6.5  ALBUMIN 1.8*    CBG:  Recent Labs Lab 08/10/16 0106 08/10/16 1216  GLUCAP 84 154*     Radiology Studies: No results found.   Medications:  Scheduled: . sodium chloride   Intravenous Once  . sodium chloride   Intravenous Once  . albuterol  2.5 mg Nebulization Q6H  . baclofen  20 mg Oral QID  . buPROPion  150 mg Oral Daily  . ceFEPIme (MAXIPIME) 2 GM IVP  2 g Intravenous Q12H  . diclofenac sodium  2  g Topical BID  . docusate  100 mg Oral Daily  . enoxaparin (LOVENOX) injection  40 mg Subcutaneous Q24H  . famotidine  20 mg Oral BID  . [START ON 08/11/2016] fentaNYL  75 mcg Transdermal Q72H  . ferrous sulfate  325 mg Oral Q breakfast  . free water  300-400 mL Per Tube Q8H  . insulin glargine  22 Units Subcutaneous QHS  . loratadine  10 mg Oral Daily  . magnesium oxide  400 mg Per Tube BID  . methylPREDNISolone (SOLU-MEDROL) injection  20 mg Intravenous Q12H  . midodrine  20 mg Oral TID WC  . multivitamin with minerals  1 tablet Per Tube Daily  . mupirocin ointment   Topical BID  . oxybutynin  5 mg Per Tube BID  . pantoprazole  80 mg Oral Daily  . pregabalin  100 mg Oral TID WC  . pregabalin  200 mg Oral QHS  . sodium chloride flush  10-40 mL Intracatheter Q12H    Continuous: . 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 Home care nurse was concerned about patient's wound. Seen by our wound care nurse here today. Discussed with her this morning. She knows the patient from his previous hospitalizations. She feels that the wound appears to be clean without any evidence for infection at this time. Patient has been on cefepime at home. His last dose is supposed to be today. Discussed with Dr. Linus Salmons. May be reasonable to discontinue antibiotics considering that his wound appears to be improved. We will repeat CBC tomorrow morning. WBC has been elevated previously as well. Some of this is thought to be the steroids that he has to get prior to his cefepime dose. Could also discuss with Dr. Baxter Flattery, who is covering for ID tomorrow.  Chronic hypotension. Continue with midodrine. Stable.  History of diabetes mellitus type 1. Continue with his home medication regimen. Sliding scale coverage.  Anemia of chronic disease. Hemoglobin has trended down. No evidence for any overt bleeding. He'll be transfused 2 units of blood. Repeat hemoglobin tomorrow morning.  Chronic pain syndrome. Continue home medications.  Functional quadriplegia. Stable. He has a trach. He has a PICC tube and an ostomy. He gets feeding through the nigh according to the mother.  However, he is able to tolerate orally as well. Diet has been initiated. Nutrition is consulted. RT is following as well for trach.  DVT Prophylaxis: Lovenox    Code Status: Full code  Family Communication: Discussed with the patient and his mother  Disposition Plan: Blood transfusion today. Anticipate discharge tomorrow.    LOS: 0 days   [place] [facility] Pager [phone] 08/10/2016, 12:34 PM  If 7PM-7AM, please contact night-coverage at [email or web address], password [facility]

## 2016-08-10 NOTE — Progress Notes (Signed)
RT made aware of new trach patient in room 6E01 by ED therapist handing off report at [place]. RT was not contacted by 6E RN until [phone] about trach patient having arrived to floor. RT arrived to 6E01 to provide breathing treatment and set up trach equipment in room due to no pt setup in room upon RT entering. RT made sure that [facility] sheet was in place, with extra trach beside bed along with ambu bag, pulse ox monitor, and suctioning equipment. Patient stated he did not want to wear aerosol trach collar but equipment is in the room if he needs or wants it.

## 2016-08-10 NOTE — Progress Notes (Addendum)
RN notified of trach supplies needed again along with education on trach supplies. RT notified RN once supplies are in pts room if needed RT to call. RT will continue to follow

## 2016-08-10 NOTE — Progress Notes (Signed)
Respiratory services made aware of patient arrival with trache. "We know."

## 2016-08-10 NOTE — Progress Notes (Signed)
[facility]  Active Home IV ABX patient with [facility].  Jason [name] is providing PDN.  [facility] hospital team will follow and support for DC to home when ordered.   If patient discharges after hours, please call [phone].   Jason [name] 08/10/2016, 7:37 AM

## 2016-08-10 NOTE — Progress Notes (Signed)
New Admission Note:  Arrival Method: Stretcher Mental Orientation: Alert and oriented x 4 Telemetry: Box 1 Assessment: Completed Skin: Multiple sacral decubitus, Paraplegia IV: RT arm SL PICC Pain: Deneis Tubes: Colostomy , Suprapubic cath Gtube  Safety Measures: Safety Fall Prevention Plan was given, discussed and signed. Admission: Completed [facility] [place] Orientation: Patient has been orientated to the room, unit and the staff. Family: Mother   Orders have been reviewed and implemented. Will continue to monitor the patient. Call light has been placed within reach and bed alarm has been activated.   Sima Matas BSN, RN  Phone Number: [phone]

## 2016-08-10 NOTE — Progress Notes (Signed)
RT educated RN on trach care. Trach care done and complete.

## 2016-08-10 NOTE — Progress Notes (Signed)
Patient has been refusing turning from side to side and elevation of heels. Explained the importance of turning and elevating heels to prevent further skin breakdown and promote healing of pressure ulcers. He stated, "Don't care". Mother at bedside. I have witnessed him use upper body strength from arms to shift body in bed. Will continue to encourage changing positions.

## 2016-08-11 DIAGNOSIS — Z8619 Personal history of other infectious and parasitic diseases: Secondary | ICD-10-CM | POA: Diagnosis not present

## 2016-08-11 DIAGNOSIS — J984 Other disorders of lung: Secondary | ICD-10-CM | POA: Diagnosis not present

## 2016-08-11 DIAGNOSIS — R509 Fever, unspecified: Secondary | ICD-10-CM | POA: Diagnosis present

## 2016-08-11 DIAGNOSIS — L89324 Pressure ulcer of left buttock, stage 4: Secondary | ICD-10-CM | POA: Diagnosis not present

## 2016-08-11 DIAGNOSIS — R4182 Altered mental status, unspecified: Secondary | ICD-10-CM | POA: Diagnosis not present

## 2016-08-11 DIAGNOSIS — E10649 Type 1 diabetes mellitus with hypoglycemia without coma: Secondary | ICD-10-CM | POA: Diagnosis present

## 2016-08-11 DIAGNOSIS — Z87891 Personal history of nicotine dependence: Secondary | ICD-10-CM | POA: Diagnosis not present

## 2016-08-11 DIAGNOSIS — E162 Hypoglycemia, unspecified: Secondary | ICD-10-CM | POA: Diagnosis not present

## 2016-08-11 DIAGNOSIS — L89214 Pressure ulcer of right hip, stage 4: Secondary | ICD-10-CM | POA: Diagnosis present

## 2016-08-11 DIAGNOSIS — M797 Fibromyalgia: Secondary | ICD-10-CM | POA: Diagnosis present

## 2016-08-11 DIAGNOSIS — I9589 Other hypotension: Secondary | ICD-10-CM | POA: Diagnosis present

## 2016-08-11 DIAGNOSIS — R410 Disorientation, unspecified: Secondary | ICD-10-CM | POA: Diagnosis not present

## 2016-08-11 DIAGNOSIS — D638 Anemia in other chronic diseases classified elsewhere: Secondary | ICD-10-CM | POA: Diagnosis present

## 2016-08-11 DIAGNOSIS — Z888 Allergy status to other drugs, medicaments and biological substances status: Secondary | ICD-10-CM | POA: Diagnosis not present

## 2016-08-11 DIAGNOSIS — L89154 Pressure ulcer of sacral region, stage 4: Secondary | ICD-10-CM | POA: Diagnosis present

## 2016-08-11 DIAGNOSIS — E1069 Type 1 diabetes mellitus with other specified complication: Secondary | ICD-10-CM | POA: Diagnosis present

## 2016-08-11 DIAGNOSIS — Z8249 Family history of ischemic heart disease and other diseases of the circulatory system: Secondary | ICD-10-CM | POA: Diagnosis not present

## 2016-08-11 DIAGNOSIS — R532 Functional quadriplegia: Secondary | ICD-10-CM | POA: Diagnosis present

## 2016-08-11 DIAGNOSIS — Z87440 Personal history of urinary (tract) infections: Secondary | ICD-10-CM | POA: Diagnosis not present

## 2016-08-11 DIAGNOSIS — Z933 Colostomy status: Secondary | ICD-10-CM | POA: Diagnosis not present

## 2016-08-11 DIAGNOSIS — E108 Type 1 diabetes mellitus with unspecified complications: Secondary | ICD-10-CM | POA: Diagnosis not present

## 2016-08-11 DIAGNOSIS — Z794 Long term (current) use of insulin: Secondary | ICD-10-CM | POA: Diagnosis not present

## 2016-08-11 DIAGNOSIS — G9340 Encephalopathy, unspecified: Secondary | ICD-10-CM | POA: Diagnosis present

## 2016-08-11 DIAGNOSIS — E1065 Type 1 diabetes mellitus with hyperglycemia: Secondary | ICD-10-CM | POA: Diagnosis present

## 2016-08-11 DIAGNOSIS — Z825 Family history of asthma and other chronic lower respiratory diseases: Secondary | ICD-10-CM | POA: Diagnosis not present

## 2016-08-11 DIAGNOSIS — Z882 Allergy status to sulfonamides status: Secondary | ICD-10-CM | POA: Diagnosis not present

## 2016-08-11 DIAGNOSIS — Z833 Family history of diabetes mellitus: Secondary | ICD-10-CM | POA: Diagnosis not present

## 2016-08-11 DIAGNOSIS — L89224 Pressure ulcer of left hip, stage 4: Secondary | ICD-10-CM | POA: Diagnosis present

## 2016-08-11 DIAGNOSIS — M86659 Other chronic osteomyelitis, unspecified thigh: Secondary | ICD-10-CM | POA: Diagnosis present

## 2016-08-11 DIAGNOSIS — K219 Gastro-esophageal reflux disease without esophagitis: Secondary | ICD-10-CM | POA: Diagnosis present

## 2016-08-11 DIAGNOSIS — D649 Anemia, unspecified: Secondary | ICD-10-CM | POA: Diagnosis not present

## 2016-08-11 DIAGNOSIS — E10319 Type 1 diabetes mellitus with unspecified diabetic retinopathy without macular edema: Secondary | ICD-10-CM | POA: Diagnosis present

## 2016-08-11 DIAGNOSIS — Z930 Tracheostomy status: Secondary | ICD-10-CM | POA: Diagnosis not present

## 2016-08-11 DIAGNOSIS — Z8673 Personal history of transient ischemic attack (TIA), and cerebral infarction without residual deficits: Secondary | ICD-10-CM | POA: Diagnosis not present

## 2016-08-11 DIAGNOSIS — D72829 Elevated white blood cell count, unspecified: Secondary | ICD-10-CM | POA: Diagnosis not present

## 2016-08-11 DIAGNOSIS — J690 Pneumonitis due to inhalation of food and vomit: Secondary | ICD-10-CM | POA: Diagnosis not present

## 2016-08-11 DIAGNOSIS — E1043 Type 1 diabetes mellitus with diabetic autonomic (poly)neuropathy: Secondary | ICD-10-CM | POA: Diagnosis present

## 2016-08-11 LAB — CBC
HEMATOCRIT: 29.5 % — AB (ref 39.0–52.0)
HEMOGLOBIN: 9.4 g/dL — AB (ref 13.0–17.0)
MCH: 26.7 pg (ref 26.0–34.0)
MCHC: 31.9 g/dL (ref 30.0–36.0)
MCV: 83.8 fL (ref 78.0–100.0)
Platelets: 334 10*3/uL (ref 150–400)
RBC: 3.52 MIL/uL — AB (ref 4.22–5.81)
RDW: 17.1 % — ABNORMAL HIGH (ref 11.5–15.5)
WBC: 23.1 10*3/uL — ABNORMAL HIGH (ref 4.0–10.5)

## 2016-08-11 LAB — URINE CULTURE

## 2016-08-11 LAB — URINE MICROSCOPIC-ADD ON

## 2016-08-11 LAB — GLUCOSE, CAPILLARY
GLUCOSE-CAPILLARY: 124 mg/dL — AB (ref 65–99)
GLUCOSE-CAPILLARY: 13 mg/dL — AB (ref 65–99)
GLUCOSE-CAPILLARY: 105 mg/dL — AB (ref 65–99)
GLUCOSE-CAPILLARY: 61 mg/dL — AB (ref 65–99)
GLUCOSE-CAPILLARY: 153 mg/dL — AB (ref 65–99)
GLUCOSE-CAPILLARY: 99 mg/dL (ref 65–99)
GLUCOSE-CAPILLARY: 60 mg/dL — AB (ref 65–99)
GLUCOSE-CAPILLARY: 171 mg/dL — AB (ref 65–99)
GLUCOSE-CAPILLARY: 96 mg/dL (ref 65–99)
GLUCOSE-CAPILLARY: 100 mg/dL — AB (ref 65–99)
GLUCOSE-CAPILLARY: 82 mg/dL (ref 65–99)
Glucose-Capillary: 129 mg/dL — ABNORMAL HIGH (ref 65–99)
Glucose-Capillary: 285 mg/dL — ABNORMAL HIGH (ref 65–99)
Glucose-Capillary: 56 mg/dL — ABNORMAL LOW (ref 65–99)
Glucose-Capillary: 94 mg/dL (ref 65–99)
Glucose-Capillary: 59 mg/dL — ABNORMAL LOW (ref 65–99)
Glucose-Capillary: 42 mg/dL — CL (ref 65–99)
Glucose-Capillary: 53 mg/dL — ABNORMAL LOW (ref 65–99)
Glucose-Capillary: 72 mg/dL (ref 65–99)

## 2016-08-11 LAB — TYPE AND SCREEN
ABO/RH(D): O POS
Antibody Screen: NEGATIVE
Unit division: 0
Unit division: 0

## 2016-08-11 LAB — BASIC METABOLIC PANEL
ANION GAP: 6 (ref 5–15)
BUN: 20 mg/dL (ref 6–20)
CHLORIDE: 103 mmol/L (ref 101–111)
CO2: 21 mmol/L — AB (ref 22–32)
Calcium: 8.7 mg/dL — ABNORMAL LOW (ref 8.9–10.3)
Creatinine, Ser: 0.52 mg/dL — ABNORMAL LOW (ref 0.61–1.24)
GFR calc non Af Amer: >60 mL/min (ref >60)
Glucose, Bld: 78 mg/dL (ref 65–99)
POTASSIUM: 4 mmol/L (ref 3.5–5.1)
SODIUM: 130 mmol/L — AB (ref 135–145)

## 2016-08-11 LAB — URINALYSIS, ROUTINE W REFLEX MICROSCOPIC
BILIRUBIN URINE: NEGATIVE
Glucose, UA: NEGATIVE mg/dL
Ketones, ur: NEGATIVE mg/dL
NITRITE: NEGATIVE
PROTEIN: 100 mg/dL — AB
Specific Gravity, Urine: 1.015 (ref 1.005–1.030)
pH: 5.5 (ref 5.0–8.0)

## 2016-08-11 MED ORDER — DEXTROSE 10 % IV SOLN
INTRAVENOUS | Status: DC
  Administered 2016-08-11: 17:00:00 via INTRAVENOUS

## 2016-08-11 MED ORDER — DEXTROSE 50 % IV SOLN
1.0000 | Freq: Once | INTRAVENOUS | Status: AC
  Administered 2016-08-11: 50 mL via INTRAVENOUS

## 2016-08-11 MED ORDER — DEXTROSE 50 % IV SOLN
INTRAVENOUS | Status: AC
  Administered 2016-08-11: 50 mL
  Filled 2016-08-11: qty 50

## 2016-08-11 MED ORDER — LORATADINE 10 MG PO TABS
10.0000 mg | ORAL_TABLET | Freq: Every day | ORAL | Status: DC
  Administered 2016-08-11 – 2016-08-17 (×7): 10 mg via ORAL
  Filled 2016-08-11 (×7): qty 1

## 2016-08-11 MED ORDER — DEXTROSE 50 % IV SOLN
INTRAVENOUS | Status: AC
  Filled 2016-08-11: qty 50

## 2016-08-11 MED ORDER — ALBUTEROL SULFATE (2.5 MG/3ML) 0.083% IN NEBU
2.5000 mg | INHALATION_SOLUTION | Freq: Two times a day (BID) | RESPIRATORY_TRACT | Status: DC
  Administered 2016-08-11 – 2016-08-17 (×13): 2.5 mg via RESPIRATORY_TRACT
  Filled 2016-08-11 (×14): qty 3

## 2016-08-11 MED ORDER — DEXTROSE 50 % IV SOLN
25.0000 mL | Freq: Once | INTRAVENOUS | Status: AC
  Administered 2016-08-11: 25 mL via INTRAVENOUS

## 2016-08-11 MED ORDER — DEXTROSE-NACL 5-0.45 % IV SOLN
INTRAVENOUS | Status: DC
  Administered 2016-08-11: 1000 mL via INTRAVENOUS

## 2016-08-11 MED ORDER — ALUM & MAG HYDROXIDE-SIMETH 200-200-20 MG/5ML PO SUSP
15.0000 mL | ORAL | Status: DC | PRN
  Administered 2016-08-11 – 2016-08-12 (×2): 15 mL via ORAL
  Filled 2016-08-11 (×2): qty 30

## 2016-08-11 MED ORDER — DEXTROSE 50 % IV SOLN
INTRAVENOUS | Status: AC
  Administered 2016-08-11: 25 mL
  Filled 2016-08-11: qty 50

## 2016-08-11 NOTE — Progress Notes (Signed)
Hypoglycemic Event  CBG: 56  Treatment:1 amp D50 and started D5 1/2 NS at 50cc/h  Symptoms:sleepy  Follow-up CBG: Time 0700   CBG Result:285  Possible Reasons for Event: Medication related  Comments/MD notified Dr. Barbaraann Faster notified and new orders recieved   Winfred Leeds

## 2016-08-11 NOTE — Progress Notes (Signed)
Hypoglycemic Event  CBG: 13  Treatment: 1 amp D50  Symptoms: lethargic  Follow-up CBG: Time:0351 CBG Result:129  Possible Reasons for Event: Change in medication  Comments/MD notified:Dr. Marily Memos notified. Rechecking CBG every one hour x 2.     Jason [name]

## 2016-08-11 NOTE — Progress Notes (Signed)
Temp 102.5 Dr. Maryland Pink notified.

## 2016-08-11 NOTE — Progress Notes (Signed)
[facility] PROGRESS NOTE  Jason [name] [id number] DOB: Dec 21, 1985 DOA: 08/09/2016  PCP: [name], MD  Brief History/Interval Summary: 30 year old African-American male who was last hospitalized here about a week ago for sacral wound infection and who has a past medical history of C6 spinal cord injury, chronic trach and PEG and ostomy, diabetes mellitus type 1. Patient presented to the emergency department as his home care nurse was concerned about patient's wound. The patient's hemoglobin was noted to be low and he had a temperature of 100.7. He was hospitalized for further management.  Reason for Visit: Anemia. Fever  Consultants: Infectious disease  Procedures: None  Antibiotics: Patient was getting cefepime at home. This was discontinued on 11/3  Subjective/Interval History: Patient developed hypoglycemia earlier this morning. He is currently on a D5 infusion. He feels well at this time. Attributes this to the higher dose of Lantus that he received last night. Denies any pain. Denies any cough, nausea or vomiting. Denies diarrhea.   ROS: Denies any chest pain or shortness of breath  Objective:  Vital Signs  Vitals:   08/11/16 0754 08/11/16 0757 08/11/16 0916 08/11/16 1100  BP:   (!) 95/54   Pulse:  99 (!) 105   Resp:  18 18   Temp:   (!) 102.5 F (39.2 C) (!) 101.7 F (38.7 C)  TempSrc:   Oral Oral  SpO2: 100% 100% 98%   Weight:      Height:        Intake/Output Summary (Last 24 hours) at 08/11/16 1218 Last data filed at 08/11/16 1000  Gross per 24 hour  Intake           2581.5 ml  Output              650 ml  Net           1931.5 ml   Filed Weights   08/09/16 1732 08/10/16 0148 08/11/16 0202  Weight: 54.9 kg (121 lb) 54.8 kg (120 lb 13 oz) 54.6 kg (120 lb 5.9 oz)    General appearance: alert, cooperative, appears stated age and no distress Resp: clear to auscultation bilaterally. Lurline Idol is noted. Normal effort. Cardio: regular rate and  rhythm, S1, S2 normal, no murmur, click, rub or gallop GI: soft, non-tender; bowel sounds normal; no masses,  no organomegaly. PEG tube is present. Ostomy is present. Extremities: extremities normal, atraumatic, no cyanosis or edema Neurologic: Quadriplegic  Lab Results:  Data Reviewed: I have personally reviewed following labs and imaging studies  CBC:  Recent Labs Lab 08/09/16 2105 08/10/16 0400 08/11/16 0920  WBC 24.2* 25.2* 23.1*  NEUTROABS 19.6*  --   --   HGB 7.0* 6.7* 9.4*  HCT 22.5* 22.1* 29.5*  MCV 84.0 85.0 83.8  PLT 474* 444* [id number]    Basic Metabolic Panel:  Recent Labs Lab 08/09/16 2105 08/11/16 0920  NA 131* 130*  K 4.0 4.0  CL 104 103  CO2 20* 21*  GLUCOSE 81 78  BUN 19 20  CREATININE 0.62 0.52*  CALCIUM 9.2 8.7*    GFR: Estimated Creatinine Clearance: 102.4 mL/min (by C-G formula based on SCr of 0.52 mg/dL (L)).  Liver Function Tests:  Recent Labs Lab 08/09/16 2105  AST 13*  ALT 8*  ALKPHOS 110  BILITOT 0.5  PROT 6.5  ALBUMIN 1.8*    CBG:  Recent Labs Lab 08/11/16 0750 08/11/16 0910 08/11/16 1006 08/11/16 1059 08/11/16 1212  GLUCAP 153* 94 59* 99 60*  Radiology Studies: No results found.   Medications:  Scheduled: . sodium chloride   Intravenous Once  . albuterol  2.5 mg Nebulization BID  . baclofen  20 mg Oral QID  . buPROPion  150 mg Oral Daily  . diclofenac sodium  2 g Topical BID  . docusate  100 mg Oral Daily  . enoxaparin (LOVENOX) injection  40 mg Subcutaneous Q24H  . famotidine  20 mg Oral BID  . fentaNYL  75 mcg Transdermal Q72H  . ferrous sulfate  325 mg Oral Q breakfast  . free water  300-400 mL Per Tube Q8H  . loratadine  10 mg Oral QHS  . magnesium oxide  400 mg Per Tube BID  . midodrine  20 mg Oral TID WC  . multivitamin with minerals  1 tablet Per Tube Daily  . mupirocin ointment   Topical BID  . oxybutynin  5 mg Per Tube BID  . pantoprazole  80 mg Oral Daily  . pregabalin  100 mg Oral TID WC    . pregabalin  200 mg Oral QHS  . sodium chloride flush  10-40 mL Intracatheter Q12H   Continuous: . dextrose 5 % and 0.45% NaCl 1,000 mL (08/11/16 0649)  . 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 Home care nurse was concerned about patient's wound. Seen by our wound care nurse. She knows the patient from his previous hospitalizations. She feels that the wound appears to be clean without any evidence for infection at this time. Patient has been on cefepime at home. His last dose is supposed to be 11/3. Discussed with Dr. Linus Salmons who evaluated the patient and discontinued cefepime. WBC slightly better this morning. WBC has been elevated previously as well. Some of this is thought to be the steroids that he has to get prior to his cefepime dose.   Fever Patient developed a temperature of 102F this morning. We will obtain blood cultures and check a UA. Discussed with Dr. Baxter Flattery with infectious disease. We will evaluate the patient. We will hold off on resuming cefepime or starting any new antibiotics.  Chronic hypotension. Continue with midodrine. Stable.  History of diabetes mellitus type 1 with hypoglycemia Hypoglycemia is likely due to higher dose of Lantus at the patient received. Will hold insulin for now. He is currently on a D5 infusion, which will be continued.  Continue to monitor CBGs closely. Discussed with his mother at bedside.  Anemia of chronic disease. Patient was transfused 2 units of PRBC yesterday. Hemoglobin has responded appropriately. Continue to monitor.   Chronic pain syndrome. Continue home medications.  Functional quadriplegia. Stable. He has a trach. He has a PICC tube and an ostomy. He gets feeding through the nigh according to the mother. However, he is able to tolerate orally as well. Diet has been initiated. Nutrition is consulted. RT is following as well for trach.  DVT Prophylaxis: Lovenox    Code Status: Full  code  Family Communication: Discussed with the patient and his mother  Disposition Plan: Not read for discharge due to fever and hypoglycemia.    LOS: 0 days   [facility] Pager [phone] 08/11/2016, 12:18 PM  If 7PM-7AM, please contact night-coverage at [email or web address], password [place]

## 2016-08-11 NOTE — Progress Notes (Signed)
Report called to Shirlean Mylar on [place].  All questions answered.

## 2016-08-11 NOTE — Progress Notes (Signed)
CBG 53. MD notified.

## 2016-08-12 LAB — CBC
HCT: 30.2 % — ABNORMAL LOW (ref 39.0–52.0)
HEMOGLOBIN: 9.4 g/dL — AB (ref 13.0–17.0)
MCH: 26.2 pg (ref 26.0–34.0)
MCHC: 31.1 g/dL (ref 30.0–36.0)
MCV: 84.1 fL (ref 78.0–100.0)
PLATELETS: 301 10*3/uL (ref 150–400)
RBC: 3.59 MIL/uL — AB (ref 4.22–5.81)
RDW: 17.3 % — ABNORMAL HIGH (ref 11.5–15.5)
WBC: 22.9 10*3/uL — ABNORMAL HIGH (ref 4.0–10.5)

## 2016-08-12 LAB — COMPREHENSIVE METABOLIC PANEL
ALBUMIN: 1.5 g/dL — AB (ref 3.5–5.0)
ALK PHOS: 106 U/L (ref 38–126)
ALT: 9 U/L — ABNORMAL LOW (ref 17–63)
ANION GAP: 6 (ref 5–15)
AST: 13 U/L — ABNORMAL LOW (ref 15–41)
BUN: 18 mg/dL (ref 6–20)
CALCIUM: 8.5 mg/dL — AB (ref 8.9–10.3)
CHLORIDE: 102 mmol/L (ref 101–111)
CO2: 22 mmol/L (ref 22–32)
Creatinine, Ser: 0.64 mg/dL (ref 0.61–1.24)
GFR calc non Af Amer: >60 mL/min (ref >60)
GLUCOSE: 162 mg/dL — AB (ref 65–99)
Potassium: 4.2 mmol/L (ref 3.5–5.1)
SODIUM: 130 mmol/L — AB (ref 135–145)
Total Bilirubin: 0.3 mg/dL (ref 0.3–1.2)
Total Protein: 6.3 g/dL — ABNORMAL LOW (ref 6.5–8.1)

## 2016-08-12 LAB — GLUCOSE, CAPILLARY
GLUCOSE-CAPILLARY: 108 mg/dL — AB (ref 65–99)
GLUCOSE-CAPILLARY: 150 mg/dL — AB (ref 65–99)
GLUCOSE-CAPILLARY: 165 mg/dL — AB (ref 65–99)
GLUCOSE-CAPILLARY: 408 mg/dL — AB (ref 65–99)
GLUCOSE-CAPILLARY: 421 mg/dL — AB (ref 65–99)
GLUCOSE-CAPILLARY: 96 mg/dL (ref 65–99)
Glucose-Capillary: 204 mg/dL — ABNORMAL HIGH (ref 65–99)
Glucose-Capillary: 276 mg/dL — ABNORMAL HIGH (ref 65–99)
Glucose-Capillary: 360 mg/dL — ABNORMAL HIGH (ref 65–99)
Glucose-Capillary: 401 mg/dL — ABNORMAL HIGH (ref 65–99)
Glucose-Capillary: 447 mg/dL — ABNORMAL HIGH (ref 65–99)

## 2016-08-12 MED ORDER — INSULIN GLARGINE 100 UNIT/ML ~~LOC~~ SOLN
10.0000 [IU] | Freq: Once | SUBCUTANEOUS | Status: AC
  Administered 2016-08-12: 10 [IU] via SUBCUTANEOUS
  Filled 2016-08-12: qty 0.1

## 2016-08-12 MED ORDER — INSULIN ASPART 100 UNIT/ML ~~LOC~~ SOLN
0.0000 [IU] | Freq: Three times a day (TID) | SUBCUTANEOUS | Status: DC
  Administered 2016-08-13: 3 [IU] via SUBCUTANEOUS
  Administered 2016-08-13: 5 [IU] via SUBCUTANEOUS
  Administered 2016-08-13: 3 [IU] via SUBCUTANEOUS
  Administered 2016-08-14: 8 [IU] via SUBCUTANEOUS
  Administered 2016-08-14: 15 [IU] via SUBCUTANEOUS
  Administered 2016-08-14 – 2016-08-15 (×2): 11 [IU] via SUBCUTANEOUS
  Administered 2016-08-15: 15 [IU] via SUBCUTANEOUS

## 2016-08-12 NOTE — Progress Notes (Signed)
[facility] PROGRESS NOTE  Jason [name] [id number] DOB: 1985/11/02 DOA: 08/09/2016  PCP: [name], MD  Brief History/Interval Summary: 30 year old African-American male who was last hospitalized here about a week ago for sacral wound infection and who has a past medical history of C6 spinal cord injury, chronic trach and PEG and ostomy, diabetes mellitus type 1. Patient presented to the emergency department as his home care nurse was concerned about patient's wound. The patient's hemoglobin was noted to be low and he had a temperature of 100.7. He was hospitalized for further management. His stay was complicated by a fever of 102F and also by episodes of hypoglycemia.  Reason for Visit: Anemia. Fever. Hypoglycemia  Consultants: Infectious disease  Procedures: None  Antibiotics: Patient was getting cefepime at home. This was discontinued on 11/3.  Subjective/Interval History: Patient remained hyperglycemic for most part of yesterday. Patient is much more awake and alert this morning. Denies any complaints. Denies any pain except for his usual back pain. His mother is at the bedside.  ROS: Denies any chest pain or shortness of breath  Objective:  Vital Signs  Vitals:   08/11/16 2008 08/12/16 0000 08/12/16 0315 08/12/16 0500  BP: 112/75 98/65 95/65    Pulse: 93 99 (!) 103   Resp: 14 14 15    Temp: 98.8 F (37.1 C) 99.9 F (37.7 C) 99.5 F (37.5 C)   TempSrc: Axillary Axillary Axillary   SpO2: 98% 99% 98%   Weight:    49.9 kg (110 lb)  Height:        Intake/Output Summary (Last 24 hours) at 08/12/16 0739 Last data filed at 08/12/16 0600  Gross per 24 hour  Intake          2974.01 ml  Output             2950 ml  Net            24.01 ml   Filed Weights   08/11/16 0202 08/11/16 1813 08/12/16 0500  Weight: 54.6 kg (120 lb 5.9 oz) 49.9 kg (110 lb 0.2 oz) 49.9 kg (110 lb)    General appearance: alert, cooperative, and no distress Resp: clear to  auscultation bilaterally. Lurline Idol is noted. Normal effort. Cardio: regular rate and rhythm, S1, S2 normal, no murmur, click, rub or gallop GI: soft, non-tender; bowel sounds normal; no masses,  no organomegaly. PEG tube is present. Ostomy is present. Neurologic: Quadriplegic  Lab Results:  Data Reviewed: I have personally reviewed following labs and imaging studies  CBC:  Recent Labs Lab 08/09/16 2105 08/10/16 0400 08/11/16 0920 08/12/16 0500  WBC 24.2* 25.2* 23.1* 22.9*  NEUTROABS 19.6*  --   --   --   HGB 7.0* 6.7* 9.4* 9.4*  HCT 22.5* 22.1* 29.5* 30.2*  MCV 84.0 85.0 83.8 84.1  PLT 474* 444* 334 [id number]    Basic Metabolic Panel:  Recent Labs Lab 08/09/16 2105 08/11/16 0920 08/12/16 0500  NA 131* 130* 130*  K 4.0 4.0 4.2  CL 104 103 102  CO2 20* 21* 22  GLUCOSE 81 78 162*  BUN 19 20 18   CREATININE 0.62 0.52* 0.64  CALCIUM 9.2 8.7* 8.5*    GFR: Estimated Creatinine Clearance: 93.6 mL/min (by C-G formula based on SCr of 0.64 mg/dL).  Liver Function Tests:  Recent Labs Lab 08/09/16 2105 08/12/16 0500  AST 13* 13*  ALT 8* 9*  ALKPHOS 110 106  BILITOT 0.5 0.3  PROT 6.5 6.3*  ALBUMIN 1.8* 1.5*  CBG:  Recent Labs Lab 08/11/16 2203 08/11/16 2359 08/12/16 0227 08/12/16 0432 08/12/16 0528  GLUCAP 82 96 108* 150* 165*     Radiology Studies: No results found.   Medications:  Scheduled: . sodium chloride   Intravenous Once  . albuterol  2.5 mg Nebulization BID  . baclofen  20 mg Oral QID  . buPROPion  150 mg Oral Daily  . diclofenac sodium  2 g Topical BID  . docusate  100 mg Oral Daily  . enoxaparin (LOVENOX) injection  40 mg Subcutaneous Q24H  . famotidine  20 mg Oral BID  . fentaNYL  75 mcg Transdermal Q72H  . ferrous sulfate  325 mg Oral Q breakfast  . free water  300-400 mL Per Tube Q8H  . loratadine  10 mg Oral QHS  . magnesium oxide  400 mg Per Tube BID  . midodrine  20 mg Oral TID WC  . multivitamin with minerals  1 tablet Per Tube  Daily  . mupirocin ointment   Topical BID  . oxybutynin  5 mg Per Tube BID  . pantoprazole  80 mg Oral Daily  . pregabalin  100 mg Oral TID WC  . pregabalin  200 mg Oral QHS  . sodium chloride flush  10-40 mL Intracatheter Q12H   Continuous: . dextrose 50 mL/hr at 08/11/16 1813  . 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 Home care nurse was concerned about patient's wound. Seen by our wound care nurse. She knows the patient from his previous hospitalizations. She feels that the wound appears to be clean without any evidence for infection at this time. Patient has been on cefepime at home. His last dose is supposed to be 11/3. Discussed with Dr. Linus Salmons who evaluated the patient and discontinued cefepime. WBC is elevated but stable. WBC has been elevated previously as well. Some of this is thought to be the steroids that he has to get prior to his cefepime dose.   History of diabetes mellitus type 1 with hypoglycemia Hypoglycemia is likely due to higher dose of Lantus at the patient received. Insulin was held. Blood sugars, however, continued to drop yesterday and he required D10 infusion. CBGs are improved. We'll slowly cut back on the rate of the D10. Hopefully, his CBGs will stabilize today. Last HbA1c is 8.0 September.  Fever Patient developed a temperature of  102F on the morning of 11/4. Hasn't had any recurrence of fever. Cultures are negative so far. Discussed with Dr. Baxter Flattery with infectious disease and the plan was to hold off on resuming cefepime or starting any new antibiotics. And tinnitus. Monitor.  Chronic hypotension. Continue with midodrine. Stable.  Anemia of chronic disease. Patient was transfused 2 units of PRBC. Hemoglobin has responded appropriately. Continue to monitor.   Chronic pain syndrome. Continue home medications.  Functional quadriplegia. Stable. He has a trach. He has a PICC tube and an ostomy. He gets feeding through the  nigh according to the mother. However, he is able to tolerate orally as well. Diet has been initiated. Nutrition is consulted. RT is following as well for trach.  DVT Prophylaxis: Lovenox    Code Status: Full code  Family Communication: Discussed with the patient and his mother  Disposition Plan: Await blood glucose levels to stabilize.    LOS: 1 day   [facility] Pager [phone] 08/12/2016, 7:39 AM  If 7PM-7AM, please contact night-coverage at [email or web address], password [facility]

## 2016-08-13 ENCOUNTER — Inpatient Hospital Stay (HOSPITAL_COMMUNITY): Payer: Medicaid Other

## 2016-08-13 DIAGNOSIS — R4182 Altered mental status, unspecified: Secondary | ICD-10-CM

## 2016-08-13 DIAGNOSIS — Z8619 Personal history of other infectious and parasitic diseases: Secondary | ICD-10-CM

## 2016-08-13 DIAGNOSIS — R000 Tachycardia, unspecified: Secondary | ICD-10-CM

## 2016-08-13 LAB — BASIC METABOLIC PANEL
Anion gap: 6 (ref 5–15)
BUN: 22 mg/dL — AB (ref 6–20)
CHLORIDE: 100 mmol/L — AB (ref 101–111)
CO2: 23 mmol/L (ref 22–32)
CREATININE: 0.73 mg/dL (ref 0.61–1.24)
Calcium: 8.7 mg/dL — ABNORMAL LOW (ref 8.9–10.3)
GFR calc Af Amer: >60 mL/min (ref >60)
GFR calc non Af Amer: >60 mL/min (ref >60)
Glucose, Bld: 254 mg/dL — ABNORMAL HIGH (ref 65–99)
POTASSIUM: 4.7 mmol/L (ref 3.5–5.1)
Sodium: 129 mmol/L — ABNORMAL LOW (ref 135–145)

## 2016-08-13 LAB — CBC
HEMATOCRIT: 27.6 % — AB (ref 39.0–52.0)
HEMOGLOBIN: 8.6 g/dL — AB (ref 13.0–17.0)
MCH: 26.3 pg (ref 26.0–34.0)
MCHC: 31.2 g/dL (ref 30.0–36.0)
MCV: 84.4 fL (ref 78.0–100.0)
Platelets: 296 10*3/uL (ref 150–400)
RBC: 3.27 MIL/uL — AB (ref 4.22–5.81)
RDW: 17.4 % — ABNORMAL HIGH (ref 11.5–15.5)
WBC: 24.2 10*3/uL — ABNORMAL HIGH (ref 4.0–10.5)

## 2016-08-13 LAB — BLOOD GAS, ARTERIAL
Acid-base deficit: 1.7 mmol/L (ref 0.0–2.0)
Bicarbonate: 22.5 mmol/L (ref 20.0–28.0)
Drawn by: 27407
FIO2: 0.21
O2 SAT: 95.3 %
PATIENT TEMPERATURE: 98.6
PO2 ART: 72.9 mmHg — AB (ref 83.0–108.0)
pCO2 arterial: 37.5 mmHg (ref 32.0–48.0)
pH, Arterial: 7.396 (ref 7.350–7.450)

## 2016-08-13 LAB — GLUCOSE, CAPILLARY
GLUCOSE-CAPILLARY: 177 mg/dL — AB (ref 65–99)
GLUCOSE-CAPILLARY: 232 mg/dL — AB (ref 65–99)
GLUCOSE-CAPILLARY: 91 mg/dL (ref 65–99)
Glucose-Capillary: 192 mg/dL — ABNORMAL HIGH (ref 65–99)
Glucose-Capillary: 177 mg/dL — ABNORMAL HIGH (ref 65–99)

## 2016-08-13 MED ORDER — SODIUM CHLORIDE 0.9 % IV BOLUS (SEPSIS)
500.0000 mL | Freq: Once | INTRAVENOUS | Status: AC
  Administered 2016-08-13: 500 mL via INTRAVENOUS

## 2016-08-13 MED ORDER — DEXTROSE 5 % IV SOLN
1.0000 g | Freq: Three times a day (TID) | INTRAVENOUS | Status: DC
  Administered 2016-08-13 – 2016-08-18 (×16): 1 g via INTRAVENOUS
  Filled 2016-08-13 (×19): qty 1

## 2016-08-13 MED ORDER — METHYLPREDNISOLONE SODIUM SUCC 40 MG IJ SOLR
20.0000 mg | Freq: Three times a day (TID) | INTRAMUSCULAR | Status: DC
  Administered 2016-08-13 – 2016-08-18 (×16): 20 mg via INTRAVENOUS
  Filled 2016-08-13 (×16): qty 1

## 2016-08-13 MED ORDER — FAMOTIDINE 20 MG PO TABS
10.0000 mg | ORAL_TABLET | Freq: Three times a day (TID) | ORAL | Status: DC
  Administered 2016-08-13 – 2016-08-18 (×17): 10 mg via ORAL
  Filled 2016-08-13 (×16): qty 1

## 2016-08-13 MED ORDER — BUPROPION HCL ER (XL) 150 MG PO TB24
150.0000 mg | ORAL_TABLET | Freq: Every day | ORAL | Status: DC
  Administered 2016-08-14 – 2016-08-18 (×5): 150 mg via ORAL
  Filled 2016-08-13 (×5): qty 1

## 2016-08-13 MED ORDER — INSULIN GLARGINE 100 UNIT/ML ~~LOC~~ SOLN
8.0000 [IU] | Freq: Every day | SUBCUTANEOUS | Status: DC
  Administered 2016-08-13 – 2016-08-15 (×3): 8 [IU] via SUBCUTANEOUS
  Filled 2016-08-13 (×3): qty 0.08

## 2016-08-13 NOTE — Care Management Note (Addendum)
Case Management Note  Patient Details  Name: KYELL TUROWSKI MRN: [id number] Date of Birth: [birth year]  Subjective/Objective:   quadraplegic from spinal cord injury with acute encephalopathy, sinus tachycardia, sacral decubs with osteo stage 4, hypoglycemia, and fever transferred to sdu secondary to ams with blood sugars of 13, has chronic hypotension and chronic anemia,was transfused 2 units prbc, on 11/5 blood sugars up to 447, per ID note patient  today with decreased mentation, increased secretions, poor po , cxr with possible pna.  NCM spoke with mother they would like to continue with [facility] and aide, [facility] was supplying the iv medication .  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  Mother states she has a wound vac from [employer] and would like to change to a [facility] wound vac.  NCM made Rickie Toye aware of this and she will be at hospital to see mother in am.  NCM asked if patient will need ambulance transport home at dc , mother states they have been putting him in the car.  NCM thinks ambulance transport will be best.  The abx has been changed, will let [facility] know.  Patient has a picc, trach, peg tube , but still take oral po, and he has an ostomy.  NCM will inform ID MD regarding  Mother wanting to change wound vac to [facility].  Per MD's , they are not following the wound vac , informed patient that NCM has spoken to several MD's and they state they are not following wound vac.  She states she will keep the [employer] wound vac that she has at home and when she goes back to [facility] she will ask them about it.                 Action/Plan:   Expected Discharge Date:  08/17/16               Expected Discharge Plan:  [place]  In-House Referral:     Discharge planning Services  CM Consult  Post Acute Care Choice:  Resumption of Svcs/PTA Provider Choice offered to:  Parent  DME Arranged:    DME Agency:     HH Arranged:  RN, Nurse's Aide [place] Agency:   [place]  Status of Service:  In process, will continue to follow  If discussed at Long Length of Stay Meetings, dates discussed:    Additional Comments:  Zenon Mayo, RN 08/13/2016, 6:53 PM

## 2016-08-13 NOTE — Progress Notes (Signed)
[facility] PROGRESS NOTE  Jason [name] [id number] DOB: 08/16/1986 DOA: 08/09/2016  PCP: [name], MD  Brief History/Interval Summary: 30 year old African-American male who was last hospitalized here about a week ago for sacral wound infection and who has a past medical history of C6 spinal cord injury, chronic trach and PEG and ostomy, diabetes mellitus type 1. Patient presented to the emergency department as his home care nurse was concerned about patient's wound. The patient's hemoglobin was noted to be low and he had a temperature of 100.7. He was hospitalized for further management. His stay was complicated by a fever of 102F and also by episodes of hypoglycemia.  Reason for Visit: Anemia. Fever. Hypoglycemia  Consultants: Infectious disease  Procedures: None  Antibiotics: Patient was getting cefepime at home. This was discontinued on 11/3.  Subjective/Interval History: Patient is not very responsive this morning. He does open his eyes and looks at me but doesn't communicate as he was doing the last few days. His mother is at the bedside.   ROS: Denies any chest pain or shortness of breath  Objective:  Vital Signs  Vitals:   08/12/16 2347 08/13/16 0016 08/13/16 0413 08/13/16 0420  BP: 103/71 103/71  105/75  Pulse: 98 97  99  Resp: 12 16  18   Temp: 97.7 F (36.5 C)  99.7 F (37.6 C)   TempSrc: Axillary  Axillary   SpO2: 97% 100%  99%  Weight:   51.7 kg (114 lb)   Height:        Intake/Output Summary (Last 24 hours) at 08/13/16 0721 Last data filed at 08/13/16 0413  Gross per 24 hour  Intake           1836.5 ml  Output             1800 ml  Net             36.5 ml   Filed Weights   08/11/16 1813 08/12/16 0500 08/13/16 0413  Weight: 49.9 kg (110 lb 0.2 oz) 49.9 kg (110 lb) 51.7 kg (114 lb)    General appearance: Somewhat lethargic. Easily arousable but does not communicate.  Resp: clear to auscultation bilaterally. Lurline Idol is noted. Appears  to be mildly tachypneic. No definite crackles or wheezing. Cardio: regular rate and rhythm, S1, S2 normal, no murmur, click, rub or gallop GI: soft, non-tender; bowel sounds normal; no masses,  no organomegaly. PEG tube is present. Ostomy is present. Neurologic: Quadriplegic  Lab Results:  Data Reviewed: I have personally reviewed following labs and imaging studies  CBC:  Recent Labs Lab 08/09/16 2105 08/10/16 0400 08/11/16 0920 08/12/16 0500  WBC 24.2* 25.2* 23.1* 22.9*  NEUTROABS 19.6*  --   --   --   HGB 7.0* 6.7* 9.4* 9.4*  HCT 22.5* 22.1* 29.5* 30.2*  MCV 84.0 85.0 83.8 84.1  PLT 474* 444* 334 [id number]    Basic Metabolic Panel:  Recent Labs Lab 08/09/16 2105 08/11/16 0920 08/12/16 0500  NA 131* 130* 130*  K 4.0 4.0 4.2  CL 104 103 102  CO2 20* 21* 22  GLUCOSE 81 78 162*  BUN 19 20 18   CREATININE 0.62 0.52* 0.64  CALCIUM 9.2 8.7* 8.5*    GFR: Estimated Creatinine Clearance: 96.9 mL/min (by C-G formula based on SCr of 0.64 mg/dL).  Liver Function Tests:  Recent Labs Lab 08/09/16 2105 08/12/16 0500  AST 13* 13*  ALT 8* 9*  ALKPHOS 110 106  BILITOT 0.5 0.3  PROT 6.5  6.3*  ALBUMIN 1.8* 1.5*    CBG:  Recent Labs Lab 08/12/16 1200 08/12/16 1403 08/12/16 1528 08/12/16 1747 08/12/16 2112  GLUCAP 360* 401* 408* 421* 447*     Radiology Studies: No results found.   Medications:  Scheduled: . sodium chloride   Intravenous Once  . albuterol  2.5 mg Nebulization BID  . baclofen  20 mg Oral QID  . buPROPion  150 mg Oral Daily  . diclofenac sodium  2 g Topical BID  . docusate  100 mg Oral Daily  . enoxaparin (LOVENOX) injection  40 mg Subcutaneous Q24H  . famotidine  20 mg Oral BID  . fentaNYL  75 mcg Transdermal Q72H  . ferrous sulfate  325 mg Oral Q breakfast  . free water  300-400 mL Per Tube Q8H  . insulin aspart  0-15 Units Subcutaneous TID WC  . loratadine  10 mg Oral QHS  . magnesium oxide  400 mg Per Tube BID  . midodrine  20 mg Oral  TID WC  . multivitamin with minerals  1 tablet Per Tube Daily  . mupirocin ointment   Topical BID  . oxybutynin  5 mg Per Tube BID  . pantoprazole  80 mg Oral Daily  . pregabalin  100 mg Oral TID WC  . pregabalin  200 mg Oral QHS  . sodium chloride flush  10-40 mL Intracatheter Q12H   Continuous: . 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 Blood work was done as well as ABG. No evidence for hypercapnia. CT head is pending. Chest x-ray shows right lower lobe consolidation which has been seen previously as well. Discussed with Dr. Linus Salmons who will weigh in. Patient is also noted to be on multiple medications that can cause alteration in mental status. We'll stop his Ativan. He is on narcotics. However, he has been on the same dose for a long period of time. If workup remains unremarkable and if he does not improve with hydration. May try to give him a dose of Narcan to see if that helps. Discussed with his mother. IV fluid boluses.  Sinus tachycardia. Patient has had poor oral intake for the last 2 days. He is noted to have low-grade fever. These could be the reasons for his tachycardia. He'll be given fluid boluses. Tylenol for temperature control. Monitor on telemetry.  Sacral decubitus ulcer with chronic osteomyelitis. Home care nurse was concerned about patient's wound. Seen  by our wound care nurse. She knows the patient from his previous hospitalizations. She feels that the wound appears to be clean without any evidence for infection at this time. Patient has been on cefepime at home. Patient seen by infectious disease. Cefepime was discontinued on November 3. WBC is elevated but stable. Hasn't had any further episodes of fever.   History of diabetes mellitus type 1 with hypoglycemia Hypoglycemia was refractory to D5. He was started on D10 with which his blood sugars stabilized. D10  was stopped yesterday. Blood glucose levels remain elevated at 400, so he was placed back on his Lantus but at lower dose. CBGs, tone and 30 this morning. Continue to monitor periodically. Continue Lantus. Last HbA1c is 8.0 September.  Fever Patient developed a temperature of 102F on the morning of 11/4. Hasn't had any recurrence of fever. Cultures are negative so far. Discussed with infectious disease and the plan was to hold off on resuming cefepime or starting any new antibiotics. No further episodes of high fevers.  Chronic hypotension. Continue with midodrine.   Anemia of chronic disease. Patient was transfused 2 units of PRBC. Hemoglobin has responded appropriately. Continue to monitor.   Chronic pain syndrome. Continue home medications.  Functional quadriplegia. Stable. He has a trach. He has a PICC tube and an ostomy. He gets feeding through the nigh according to the mother. However, he is able to tolerate orally as well. Diet has been initiated. Nutrition is consulted. RT is following as well for trach.  DVT Prophylaxis: Lovenox    Code Status: Full code  Family Communication: Discussed with his mother  Disposition Plan: Management as outlined above. Pursue further workup for altered mental status.    LOS: 2 days   [facility] Pager [phone] 08/13/2016, 7:21 AM  If 7PM-7AM, please contact night-coverage at [email or web address], password [facility]

## 2016-08-13 NOTE — Progress Notes (Signed)
ANTIBIOTIC CONSULT NOTE - INITIAL  Pharmacy Consult for Aztreonam Indication: pneumonia  Allergies  Allergen Reactions  . Amikacin Sulfate Other (See Comments)    Seizure   . Cefuroxime Axetil Anaphylaxis    Tolerated cefepime 12/2015 - with Pepcid/Solu-Medrol  . Ertapenem Other (See Comments)    Rash and confusion-->tolerated Imipenem   . Morphine And Related Other (See Comments)    Changed mental status, confusion, headache, visual hallucination  . Nsaids Itching and Other (See Comments)    Risk of bleeding, itching  . Penicillins Anaphylaxis and Other (See Comments)    Tolerated Imipenem; no reaction to 7 day course of amoxicillin in 2015 Has patient had a PCN reaction causing immediate rash, facial/tongue/throat swelling, SOB or lightheadedness with hypotension: Yes Has patient had a PCN reaction causing severe rash involving mucus membranes or skin necrosis: No Has patient had a PCN reaction that required hospitalization Yes Has patient had a PCN reaction occurring within the last 10 years: No If all of the above answers are "NO", then may proceed with Cephalo  . Sulfa Antibiotics Anaphylaxis, Shortness Of Breath and Other (See Comments)  . Tessalon [Benzonatate] Anaphylaxis  . Levaquin [Levofloxacin In D5w] Swelling    Hand and forearm swelling  . Shellfish Allergy Itching and Other (See Comments)    Took benadryl to alleviate reaction  . Miripirium Rash and Other (See Comments)    Change in mental status    Patient Measurements: Height: 5\' 8"  (172.7 cm) Weight: 114 lb (51.7 kg) IBW/kg (Calculated) : 68.4 Adjusted Body Weight:    Vital Signs: Temp: 99.1 F (37.3 C) (11/06 0724) Temp Source: Axillary (11/06 0724) BP: 93/58 (11/06 0724) Pulse Rate: 101 (11/06 0828) Intake/Output from previous day: 11/05 0701 - 11/06 0700 In: 1836.5 [P.O.:240; I.V.:220.2; NG/GT:1376.3] Out: 1800 [Urine:1500; Stool:300] Intake/Output from this shift: Total I/O In: 409  [NG/GT:409] Out: 300 [Urine:300]  Labs:  Recent Labs  08/11/16 0920 08/12/16 0500 08/13/16 0730 08/13/16 0800  WBC 23.1* 22.9*  --  24.2*  HGB 9.4* 9.4*  --  8.6*  PLT 334 301  --  296  CREATININE 0.52* 0.64 0.73  --    Estimated Creatinine Clearance: 96.9 mL/min (by C-G formula based on SCr of 0.73 mg/dL). No results for input(s): VANCOTROUGH, VANCOPEAK, VANCORANDOM, GENTTROUGH, GENTPEAK, GENTRANDOM, TOBRATROUGH, TOBRAPEAK, TOBRARND, AMIKACINPEAK, AMIKACINTROU, AMIKACIN in the last 72 hours.   Microbiology:   Medical History: Past Medical History:  Diagnosis Date  . Anemia   . Anxiety   . Arthritis   . Asthma   . 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 Colostomy in place [facility])   . Complication of anesthesia    difficulty waking up, will have sudden drop in BP and O2 sats  . Depression   . Diabetic neuropathy ([place])   . Diabetic retinopathy ([place])   . Dysrhythmia    usually due to medications  . Family history of anesthesia complication    Pt mother can't have epidural procedures  . Fibromyalgia   . Gastroparesis   . GERD (gastroesophageal reflux disease)   . H/O constipation   . H/O respiratory failure   . H/O thrombosis   . Hx MRSA infection    on face  . Multiple allergies 11/02/2015  . Pneumonia   . Recurrent UTI 11/02/2015  . Seizures ([name]) 2017   one time during admission in hosptital  . 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 Syncope 02/16/2015  .  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 Type I diabetes mellitus ([place])    sees Dr. Loanne Drilling    Assessment:  ID: Cefepime (premed with Solumedrol 20mg ) PTA for polymicrobial bacteremia 2/2 UTI - ID consulted last admit, to complete 2 week tx from 10/23 >> 11/4. Pt also with decubitus ulcers/chronic osteo - Per Dr Baxter Flattery continue cefepime for now and may need addition of Vanc if pt decompensates. --Tmax/24h: 99.1, WBC 24.2 up (steroids), SCr 0.73 (quad, baseline 0.6),  CrCl>30 ml/min --11/6: Start Aztreonam with premedication for HCAP  Antimicrobials this admission: 10/19 (PTA) Cefepime >> 11/3 Aztreonam 11/6>>  Microbiology results: 11/2 BCx x2>> 11/4 BC x 2>> 11/2 UCx: final 10/17: Group B Strep (pan-S except R-eryth) + Proteus (pan-S)   Goal of Therapy:  Eradication of infection  Plan:  Aztreonam 1g IV q8hr. Solumedrol 20mg  IV q 8 hrs with abx. Pepcid 10mg  q 8hr with abx   Nicki Furlan S. Alford Highland, PharmD, BCPS Clinical Staff Pharmacist Pager [phone]  Eilene Ghazi Stillinger 08/13/2016,10:15 AM

## 2016-08-13 NOTE — Progress Notes (Signed)
[facility] for Infectious Disease   Reason for visit: Follow up on fever  Interval History: asked to see patient again with decreased mentation, increased secretions.  Poor po.   CXR independently reviewed and possible pneumonia.    Physical Exam: Constitutional:  Vitals:   08/13/16 0724 08/13/16 0828  BP: (!) 93/58   Pulse: (!) 113 (!) 101  Resp: 12 15  Temp: 99.1 F (37.3 C)   not arousable Eyes: anicteric HENT: + trach Respiratory: diffuse rhonchi Cardiovascular: tachy RR GI: soft, nt, nd  Review of Systems: Unable to be assessed due to mental status  Lab Results  Component Value Date   WBC 24.2 (H) 08/13/2016   HGB 8.6 (L) 08/13/2016   HCT 27.6 (L) 08/13/2016   MCV 84.4 08/13/2016   PLT 296 08/13/2016    Lab Results  Component Value Date   CREATININE 0.73 08/13/2016   BUN 22 (H) 08/13/2016   NA 129 (L) 08/13/2016   K 4.7 08/13/2016   CL 100 (L) 08/13/2016   CO2 23 08/13/2016    Lab Results  Component Value Date   ALT 9 (L) 08/12/2016   AST 13 (L) 08/12/2016   ALKPHOS 106 08/12/2016     Microbiology: Recent Results (from the past 240 hour(s))  Urine culture     Status: Abnormal   Collection Time: 08/09/16  9:05 PM  Result Value Ref Range Status   Specimen Description URINE, RANDOM  Final   Special Requests NONE  Final   Culture MULTIPLE SPECIES PRESENT, SUGGEST RECOLLECTION (A)  Final   Report Status 08/11/2016 FINAL  Final  Blood Culture (routine x 2)     Status: None (Preliminary result)   Collection Time: 08/09/16 10:30 PM  Result Value Ref Range Status   Specimen Description BLOOD LEFT HAND  Final   Special Requests IN PEDIATRIC BOTTLE 1CC  Final   Culture NO GROWTH 3 DAYS  Final   Report Status PENDING  Incomplete  Blood Culture (routine x 2)     Status: None (Preliminary result)   Collection Time: 08/09/16 10:45 PM  Result Value Ref Range Status   Specimen Description BLOOD RIGHT HAND  Final   Special Requests IN PEDIATRIC  BOTTLE 2ML  Final   Culture NO GROWTH 3 DAYS  Final   Report Status PENDING  Incomplete  Culture, blood (Routine X 2) w Reflex to ID Panel     Status: None (Preliminary result)   Collection Time: 08/11/16 11:40 AM  Result Value Ref Range Status   Specimen Description BLOOD LEFT ANTECUBITAL  Final   Special Requests IN PEDIATRIC BOTTLE 1CC  Final   Culture NO GROWTH < 24 HOURS  Final   Report Status PENDING  Incomplete  Culture, blood (Routine X 2) w Reflex to ID Panel     Status: None (Preliminary result)   Collection Time: 08/11/16 11:45 AM  Result Value Ref Range Status   Specimen Description BLOOD BLOOD LEFT HAND  Final   Special Requests IN PEDIATRIC BOTTLE 3CC  Final   Culture NO GROWTH < 24 HOURS  Final   Report Status PENDING  Incomplete    Impression/Plan:  1. AMS - ? If infection. I will start empiric aztreonam.  He does have a history of MDRO but will start with this.  Will premedicate though no history of allergy to aztreonam that I can find.  CT head and chest. 2. History of MDRO - if he worsens, will consider avibactam. 3.  Fever - had once on 11/4.  Will continue to monitor.  Blood cultures with fever.

## 2016-08-13 NOTE — Progress Notes (Addendum)
Inpatient Diabetes Program Recommendations  AACE/ADA: New Consensus Statement on Inpatient Glycemic Control (2015)  Target Ranges:  Prepandial:   less than 140 mg/dL      Peak postprandial:   less than 180 mg/dL (1-2 hours)      Critically ill patients:  140 - 180 mg/dL   Lab Results  Component Value Date   GLUCAP 232 (H) 08/13/2016   HGBA1C 8.0 (H) 07/04/2016    Review of Glycemic Control   Results for ROCK, RIZK (MRN [id number]) as of 08/13/2016 10:38  Ref. Range 08/12/2016 14:03 08/12/2016 15:28 08/12/2016 17:47 08/12/2016 21:12 08/13/2016 07:23  Glucose-Capillary Latest Ref Range: 65 - 99 mg/dL 401 (H) 408 (H) 421 (H) 447 (H) 232 (H)    Diabetes history: DM1  Outpatient Diabetes medications: Lantus 22 units hs+ Novolog 0-10 units correction scale   Current orders for Inpatient glycemic control:  Novolog 0-15 units tid * steroids  Inpatient Diabetes Program Recommendations:  Please consider adding meal coverage Novolog 2 units if he eats 50%.  Consider starting 10 units Lantus insulin.   Please change diet to heart healthy/carb modified.   Gentry Fitz, RN, BA, MHA, CDE Diabetes Coordinator Inpatient Diabetes Program  [phone] (Team Pager) [phone] ([place]) 08/13/2016 10:44 AM

## 2016-08-14 LAB — CBC
HEMATOCRIT: 29.6 % — AB (ref 39.0–52.0)
Hemoglobin: 9.1 g/dL — ABNORMAL LOW (ref 13.0–17.0)
MCH: 25.9 pg — AB (ref 26.0–34.0)
MCHC: 30.7 g/dL (ref 30.0–36.0)
MCV: 84.3 fL (ref 78.0–100.0)
PLATELETS: 405 10*3/uL — AB (ref 150–400)
RBC: 3.51 MIL/uL — ABNORMAL LOW (ref 4.22–5.81)
RDW: 17.5 % — AB (ref 11.5–15.5)
WBC: 28.2 10*3/uL — AB (ref 4.0–10.5)

## 2016-08-14 LAB — GLUCOSE, CAPILLARY
Glucose-Capillary: 370 mg/dL — ABNORMAL HIGH (ref 65–99)
Glucose-Capillary: 343 mg/dL — ABNORMAL HIGH (ref 65–99)
Glucose-Capillary: 269 mg/dL — ABNORMAL HIGH (ref 65–99)
Glucose-Capillary: 207 mg/dL — ABNORMAL HIGH (ref 65–99)

## 2016-08-14 LAB — BASIC METABOLIC PANEL
Anion gap: 7 (ref 5–15)
BUN: 25 mg/dL — AB (ref 6–20)
CHLORIDE: 102 mmol/L (ref 101–111)
CO2: 21 mmol/L — ABNORMAL LOW (ref 22–32)
CREATININE: 0.88 mg/dL (ref 0.61–1.24)
Calcium: 9 mg/dL (ref 8.9–10.3)
GFR calc Af Amer: >60 mL/min (ref >60)
GFR calc non Af Amer: >60 mL/min (ref >60)
GLUCOSE: 303 mg/dL — AB (ref 65–99)
POTASSIUM: 5.1 mmol/L (ref 3.5–5.1)
SODIUM: 130 mmol/L — AB (ref 135–145)

## 2016-08-14 LAB — CULTURE, BLOOD (ROUTINE X 2)
CULTURE: NO GROWTH
Culture: NO GROWTH

## 2016-08-14 MED ORDER — SODIUM CHLORIDE 0.9 % IV SOLN
INTRAVENOUS | Status: AC
  Administered 2016-08-14 – 2016-08-15 (×3): via INTRAVENOUS

## 2016-08-14 MED ORDER — SODIUM CHLORIDE 0.9 % IV BOLUS (SEPSIS)
500.0000 mL | Freq: Once | INTRAVENOUS | Status: AC
  Administered 2016-08-14: 500 mL via INTRAVENOUS

## 2016-08-14 NOTE — Consult Note (Signed)
Reason for Consult:Suprapubic Tube Change  Referring Physician: Bonnielee Haff MD  Jason [name] is an 30 y.o. male.   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 87O silicone changed Q monthly by [facility] RN.   Today "Jason [name]" is seen in consultation for above. He is in house with suspect pneumonia and apparently missed SPT change and his mother request exchange. RN does not feel comfortable.   Past Medical History:  Diagnosis Date  . Anemia   . Anxiety   . Arthritis   . Asthma   . Chronic osteomyelitis involving pelvic region and thigh (Jason [name]) 06/18/2016  . Colostomy in place [facility])   . Complication of anesthesia    difficulty waking up, will have sudden drop in BP and O2 sats  . Depression   . Diabetic neuropathy (Jason [name])   . Diabetic retinopathy (Jason [name])   . Dysrhythmia    usually due to medications  . Family history of anesthesia complication    Pt mother can't have epidural procedures  . Fibromyalgia   . Gastroparesis   . GERD (gastroesophageal reflux disease)   . H/O constipation   . H/O respiratory failure   . H/O thrombosis   . Hx MRSA infection    on face  . Multiple allergies 11/02/2015  . Pneumonia   . Recurrent UTI 11/02/2015  . Seizures (Jason [name]) 2017   one time during admission in hosptital  . 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 Syncope 02/16/2015  . Tracheostomy present (Jason [name])   . Type I diabetes mellitus (Jason [name])    sees Dr. Loanne [name]     Past Surgical History:  Procedure Laterality Date  . APPLICATION OF A-CELL OF BACK N/A 11/12/2014   Procedure: APPLICATION A CELL AND VAC ;  Surgeon: Jason Kos, DO;  Location: [place];  Service: Plastics;  Laterality: N/A;  . APPLICATION OF A-CELL OF BACK N/A 12/08/2014   Procedure: APPLICATION OF A-CELL AND WOUND VAC ;  Surgeon: Jason Kos, DO;  Location: [place];  Service: Plastics;  Laterality: N/A;  . APPLICATION OF A-CELL OF CHEST/ABDOMEN N/A  08/04/2015   Procedure: APPLICATION OF A-CELL ;  Surgeon: Loel Lofty Dillingham, DO;  Location: [place];  Service: Plastics;  Laterality: N/A;  . COLOSTOMY    . DEBRIDMENT OF DECUBITUS ULCER N/A 10/04/2014   Procedure: DEBRIDMENT OF DECUBITUS ULCER;  Surgeon: Jason Skeans, MD;  Location: [place];  Service: General;  Laterality: N/A;  . GASTROSTOMY TUBE PLACEMENT    . HEMATOMA EVACUATION N/A 05/05/2015   Procedure: EVACUATION HEMATOMA bedside procedure;  Surgeon: Jason Kos, DO;  Location: [place];  Service: Plastics;  Laterality: N/A;  . INCISION AND DRAINAGE OF WOUND N/A 11/12/2014   Procedure: IRRIGATION AND DEBRIDEMENT OF WOUNDS WITH BONE BIOPSY AND SURGICAL PREP ;  Surgeon: Jason Kos, DO;  Location: [place];  Service: Plastics;  Laterality: N/A;  . INCISION AND DRAINAGE OF WOUND N/A 11/18/2014   Procedure: IRRIGATION AND DEBRIDEMENT OF SACRAL ULCER ONLY WITH PLACEMENT OF A CELL AND VAC/ DRESSING CHANGE TO UPPER BACK AREA.;  Surgeon: Jason Kos, DO;  Location: [name];  Service: Plastics;  Laterality: N/A;  . INCISION AND DRAINAGE OF WOUND N/A 11/25/2014   Procedure: IRRIGATION AND DEBRIDEMENT OF SACRAL ULCER AND BACK BURN WITH PLACEMENT OF A-CELL;  Surgeon: Jason Kos, DO;  Location: [place];  Service: Plastics;  Laterality: N/A;  . INCISION AND DRAINAGE OF WOUND Right  12/08/2014   Procedure: IRRIGATION AND DEBRIDEMENT SACRAL WOUND AND RIGHT ISCHIAL WOUND ;  Surgeon: Jason Kos, DO;  Location: [place];  Service: Plastics;  Laterality: Right;  . INCISION AND DRAINAGE OF WOUND Bilateral 05/05/2015   Procedure: IRRIGATION AND DEBRIDEMENT SACRAL ULCER;  Surgeon: Jason Kos, DO;  Location: [place];  Service: Plastics;  Laterality: Bilateral;  . INCISION AND DRAINAGE OF WOUND N/A 08/04/2015   Procedure: IRRIGATION AND DEBRIDEMENT OF SACRAL ULCER AND ;  Surgeon: Loel Lofty Dillingham, DO;  Location: [place];  Service: Plastics;  Laterality: N/A;  . INSERTION OF SUPRAPUBIC CATHETER N/A 10/12/2014   Procedure:  INSERTION OF SUPRAPUBIC CATHETER;  Surgeon: Jason Packer, MD;  Location: [place];  Service: Urology;  Laterality: N/A;  . IR GENERIC HISTORICAL  07/11/2016   IR CM INJ ANY COLONIC TUBE W/FLUORO 07/11/2016 Jason Cleveland, MD [facility]-INTERV RAD  . LAPAROSCOPIC DIVERTED COLOSTOMY N/A 10/12/2014   Procedure: LAPAROSCOPIC DIVERTING COLOSTOMY;  Surgeon: Jason Mesa, MD;  Location: [place];  Service: General;  Laterality: N/A;  . MINOR APPLICATION OF WOUND VAC N/A 11/25/2014   Procedure:  WOUND VAC CHANGE;  Surgeon: Jason Kos, DO;  Location: [place];  Service: Plastics;  Laterality: N/A;  . MULTIPLE EXTRACTIONS WITH ALVEOLOPLASTY N/A 08/03/2014   Procedure: MULTIPLE EXTRACTIONS;  Surgeon: Jason [name], DDS;  Location: [place];  Service: Oral Surgery;  Laterality: N/A;  . RADIOLOGY WITH ANESTHESIA N/A 07/05/2016   Procedure: RADIOLOGY WITH ANESTHESIA MRI OF PELVIS WITH AND WITHOUT;  Surgeon: Jason Radiologist, MD;  Location: [place];  Service: Radiology;  Laterality: N/A;  . TEE WITHOUT CARDIOVERSION N/A 08/17/2014   Procedure: TRANSESOPHAGEAL ECHOCARDIOGRAM (TEE);  Surgeon: Jason Spark, MD;  Location: [place];  Service: Cardiovascular;  Laterality: N/A;  . TEE WITHOUT CARDIOVERSION N/A 05/05/2015   Procedure: TRANSESOPHAGEAL ECHOCARDIOGRAM (TEE);  Surgeon: Jason Perla, MD;  Location: [facility] ENDOSCOPY;  Service: Cardiovascular;  Laterality: N/A;  . TONSILLECTOMY      Family History  Problem Relation Age of Onset  . Diabetes Father   . Hypertension Father   . Heart disease Mother     congenital - MVP  . Asthma      fhx  . Hypertension      fhx  . Stroke      fhx    Social History:  reports that he quit smoking about 23 months ago. His smoking use included Cigars and Cigarettes. He has a 12.00 pack-year smoking history. He has never used smokeless tobacco. He reports that he does not drink alcohol or use drugs.  Allergies:  Allergies  Allergen Reactions  . Amikacin Sulfate Other  (See Comments)    Seizure   . Cefuroxime Axetil Anaphylaxis    Tolerated cefepime 12/2015 - with Pepcid/Solu-Medrol  . Ertapenem Other (See Comments)    Rash and confusion-->tolerated Imipenem   . Morphine And Related Other (See Comments)    Changed mental status, confusion, headache, visual hallucination  . Nsaids Itching and Other (See Comments)    Risk of bleeding, itching  . Penicillins Anaphylaxis and Other (See Comments)    Tolerated Imipenem; no reaction to 7 day course of amoxicillin in 2015 Has patient had a PCN reaction causing immediate rash, facial/tongue/throat swelling, SOB or lightheadedness with hypotension: Yes Has patient had a PCN reaction causing severe rash involving mucus membranes or skin necrosis: No Has patient had a PCN reaction that required hospitalization Yes Has patient had a PCN reaction occurring within the last  10 years: No If all of the above answers are "NO", then may proceed with Cephalo  . Sulfa Antibiotics Anaphylaxis, Shortness Of Breath and Other (See Comments)  . Tessalon [Benzonatate] Anaphylaxis  . Levaquin [Levofloxacin In D5w] Swelling    Hand and forearm swelling  . Shellfish Allergy Itching and Other (See Comments)    Took benadryl to alleviate reaction  . Miripirium Rash and Other (See Comments)    Change in mental status    Medications: I have reviewed the patient's current medications.  Results for orders placed or performed during the hospital encounter of 08/09/16 (from the past 48 hour(s))  Glucose, capillary     Status: Abnormal   Collection Time: 08/12/16  3:28 PM  Result Value Ref Range   Glucose-Capillary 408 (H) 65 - 99 mg/dL   Comment 1 Notify RN    Comment 2 Document in Chart   Glucose, capillary     Status: Abnormal   Collection Time: 08/12/16  5:47 PM  Result Value Ref Range   Glucose-Capillary 421 (H) 65 - 99 mg/dL   Comment 1 Notify RN    Comment 2 Document in Chart   Glucose, capillary     Status: Abnormal    Collection Time: 08/12/16  9:12 PM  Result Value Ref Range   Glucose-Capillary 447 (H) 65 - 99 mg/dL  Glucose, capillary     Status: Abnormal   Collection Time: 08/13/16  7:23 AM  Result Value Ref Range   Glucose-Capillary 232 (H) 65 - 99 mg/dL   Comment 1 Notify RN    Comment 2 Document in Chart   Basic metabolic panel     Status: Abnormal   Collection Time: 08/13/16  7:30 AM  Result Value Ref Range   Sodium 129 (L) 135 - 145 mmol/L   Potassium 4.7 3.5 - 5.1 mmol/L   Chloride 100 (L) 101 - 111 mmol/L   CO2 23 22 - 32 mmol/L   Glucose, Bld 254 (H) 65 - 99 mg/dL   BUN 22 (H) 6 - 20 mg/dL   Creatinine, Ser 0.73 0.61 - 1.24 mg/dL   Calcium 8.7 (L) 8.9 - 10.3 mg/dL   GFR calc non Af Amer >60 >60 mL/min   GFR calc Af Amer >60 >60 mL/min    Comment: (NOTE) The eGFR has been calculated using the CKD EPI equation. This calculation has not been validated in all clinical situations. eGFR's persistently <60 mL/min signify possible Chronic Kidney Disease.    Anion gap 6 5 - 15  CBC     Status: Abnormal   Collection Time: 08/13/16  8:00 AM  Result Value Ref Range   WBC 24.2 (H) 4.0 - 10.5 K/uL   RBC 3.27 (L) 4.22 - 5.81 MIL/uL   Hemoglobin 8.6 (L) 13.0 - 17.0 g/dL   HCT 27.6 (L) 39.0 - 52.0 %   MCV 84.4 78.0 - 100.0 fL   MCH 26.3 26.0 - 34.0 pg   MCHC 31.2 30.0 - 36.0 g/dL   RDW 17.4 (H) 11.5 - 15.5 %   Platelets 296 150 - 400 K/uL  Blood gas, arterial     Status: Abnormal   Collection Time: 08/13/16  8:25 AM  Result Value Ref Range   FIO2 0.21    Delivery systems ROOM AIR    pH, Arterial 7.396 7.350 - 7.450   pCO2 arterial 37.5 32.0 - 48.0 mmHg   pO2, Arterial 72.9 (L) 83.0 - 108.0 mmHg   Bicarbonate 22.5 20.0 -  28.0 mmol/L   Acid-base deficit 1.7 0.0 - 2.0 mmol/L   O2 Saturation 95.3 %   Patient temperature 98.6    Collection site RIGHT RADIAL    Drawn by [phone]    Sample type ARTERIAL DRAW    Allens test (pass/fail) PASS PASS  Glucose, capillary     Status: Abnormal    Collection Time: 08/13/16 12:27 PM  Result Value Ref Range   Glucose-Capillary 177 (H) 65 - 99 mg/dL   Comment 1 Notify RN    Comment 2 Document in Chart   Glucose, capillary     Status: Abnormal   Collection Time: 08/13/16  5:08 PM  Result Value Ref Range   Glucose-Capillary 192 (H) 65 - 99 mg/dL   Comment 1 Notify RN    Comment 2 Document in Chart   Glucose, capillary     Status: Abnormal   Collection Time: 08/13/16  9:14 PM  Result Value Ref Range   Glucose-Capillary 177 (H) 65 - 99 mg/dL  Basic metabolic panel     Status: Abnormal   Collection Time: 08/14/16  4:47 AM  Result Value Ref Range   Sodium 130 (L) 135 - 145 mmol/L   Potassium 5.1 3.5 - 5.1 mmol/L   Chloride 102 101 - 111 mmol/L   CO2 21 (L) 22 - 32 mmol/L   Glucose, Bld 303 (H) 65 - 99 mg/dL   BUN 25 (H) 6 - 20 mg/dL   Creatinine, Ser 0.88 0.61 - 1.24 mg/dL   Calcium 9.0 8.9 - 10.3 mg/dL   GFR calc non Af Amer >60 >60 mL/min   GFR calc Af Amer >60 >60 mL/min    Comment: (NOTE) The eGFR has been calculated using the CKD EPI equation. This calculation has not been validated in all clinical situations. eGFR's persistently <60 mL/min signify possible Chronic Kidney Disease.    Anion gap 7 5 - 15  CBC     Status: Abnormal   Collection Time: 08/14/16  4:47 AM  Result Value Ref Range   WBC 28.2 (H) 4.0 - 10.5 K/uL   RBC 3.51 (L) 4.22 - 5.81 MIL/uL   Hemoglobin 9.1 (L) 13.0 - 17.0 g/dL   HCT 29.6 (L) 39.0 - 52.0 %   MCV 84.3 78.0 - 100.0 fL   MCH 25.9 (L) 26.0 - 34.0 pg   MCHC 30.7 30.0 - 36.0 g/dL   RDW 17.5 (H) 11.5 - 15.5 %   Platelets 405 (H) 150 - 400 K/uL  Glucose, capillary     Status: Abnormal   Collection Time: 08/14/16  7:24 AM  Result Value Ref Range   Glucose-Capillary 370 (H) 65 - 99 mg/dL   Comment 1 Notify RN    Comment 2 Document in Chart   Glucose, capillary     Status: Abnormal   Collection Time: 08/14/16 12:37 PM  Result Value Ref Range   Glucose-Capillary 343 (H) 65 - 99 mg/dL    *Note: Due to a large number of results and/or encounters for the requested time period, some results have not been displayed. A complete set of results can be found in Results Review.    Ct Head Wo Contrast  Result Date: 08/13/2016 CLINICAL DATA:  31 year old male with altered mental status. Subsequent encounter. EXAM: CT HEAD WITHOUT CONTRAST TECHNIQUE: Contiguous axial images were obtained from the base of the skull through the vertex without intravenous contrast. COMPARISON:  07/24/2016 head CT.  08/17/2014 brain MR. FINDINGS: Brain: No intracranial hemorrhage or CT  evidence of large acute infarct. Mild global atrophy without hydrocephalus. No CT evidence of encephalitis or meningitis however, if this were of clinical concern, further investigation may be necessary. No intracranial mass lesion noted on this unenhanced exam. Vascular: No hyperdense vessel. Skull: No destructive lesion. Sinuses/Orbits: No acute orbital abnormality. Mild mucosal thickening ethmoid sinus air cells, sphenoid sinuses and maxillary sinuses. Other: Caries. IMPRESSION: No acute intracranial abnormality. Mild mucosal thickening ethmoid sinus air cells, sphenoid sinuses and maxillary sinuses. Please see above. Electronically Signed   By: Genia Del M.D.   On: 08/13/2016 12:42   Ct Chest Wo Contrast  Result Date: 08/13/2016 CLINICAL DATA:  Possible pneumonia EXAM: CT CHEST WITHOUT CONTRAST TECHNIQUE: Multidetector CT imaging of the chest was performed following the standard protocol without IV contrast. COMPARISON:  Chest x-ray 08/13/2016 FINDINGS: Cardiovascular: Borderline cardiomegaly is noted. No pericardial effusion. There is right arm PICC line with tip in right atrium. Mediastinum/Nodes: No mediastinal adenopathy is noted on this unenhanced scan. Moderate size hiatal hernia measures 5.1 cm. Tracheostomy tube partially visualized. Lungs/Pleura: Images of the lung parenchyma shows consolidation with air bronchogram in  right lower lobe consistent with pneumonia. Small patchy infiltrate with air bronchogram noted in left lower lobe posterior medially. Small patchy infiltrate in posterior aspect right upper lobe. Central airways are patent. Upper Abdomen: The visualized upper abdomen shows no adrenal gland mass. Nonspecific mild left perinephric stranding. Clinical correlation is necessary. Musculoskeletal: No destructive bony lesions are noted. Sagittal images of the spine shows mild compression deformities T7-T8 T10 and T12 vertebral bodies. There is probable healing fracture and Schmorl's node deformity upper endplate of T8 and upper endplate of L1 vertebral body. Clinical correlation is necessary. Further correlation with MRI could be performed as clinically warranted. IMPRESSION: 1. Moderate size hiatal hernia. 2. There is consolidation with air bronchogram in right lower lobe consistent with pneumonia. Small patchy infiltrate/pneumonia in noted in left lower lobe posterior medially. Small patchy infiltrate in posterior aspect of the right upper lobe. No mediastinal hematoma or adenopathy. 3. Tracheostomy tube in place. 4. Sagittal images of the spine shows mild compression deformities T7-T8 T10 and T12 vertebral bodies. There is probable healing fracture and Schmorl's node deformity upper endplate of T8 and upper endplate of L1 vertebral body. Less likely osteomyelitis. Clinical correlation is necessary. Further correlation with MRI could be performed as clinically warranted. Electronically Signed   By: Lahoma Crocker M.D.   On: 08/13/2016 13:02   Dg Chest Port 1 View  Result Date: 08/13/2016 CLINICAL DATA:  Dyspnea.  Lung congestion. EXAM: PORTABLE CHEST 1 VIEW COMPARISON:  07/24/2016 FINDINGS: Tracheostomy tube tip is above the carina. There is a right arm PICC line with tip in the inferior right atrium. Normal heart size. Small right pleural effusion is again noted. Persistent airspace consolidation within the right lower  lobe. IMPRESSION: Persistent right lower lobe pneumonia. Low position of right arm PICC line with tip in the inferior right atrium. Electronically Signed   By: Kerby Moors M.D.   On: 08/13/2016 08:11    Review of Systems  Unable to perform ROS: Patient nonverbal   Blood pressure (!) 145/95, pulse (!) 107, temperature 99 F (37.2 C), temperature source Oral, resp. rate 12, height _0  (1.727 m), weight 51.3 kg (113 lb), SpO2 99 %. Physical Exam  Constitutional:  Stigmata of chronic disease. He is at baseline. Mother at bedside.   HENT:  Head: Normocephalic.  Eyes: Pupils are equal, round, and reactive to light.  Neck: Normal range of motion.  Cardiovascular: Normal rate.   By bedside monitor  Respiratory:  Non-labored at present  GI: Soft.  End ostomy with liquid stool in appliance. Feeding tube in place.   Genitourinary:  Genitourinary Comments: SPT in situ with yellow urine. No skin erosion.   Musculoskeletal:  LE wasting and ischemic changes, stable.   Neurological:  AO x 0  Skin: Skin is warm.    Using aseptic technique I taught RN on duty to change SPT. Old removed and non encrusted. New 45F SPT placed with 10cc sterile water in balloon. Irrigates quantitatively. Connected to new bag and leg strap.   Assessment/Plan:  Hyper-reflexic Neurogenic Bladder - SPT changed as per above. This is usually done by [facility] and can be done by RN in future if they feel comfortable.   Please call with questions.   Giada Schoppe 08/14/2016, 3:10 PM

## 2016-08-14 NOTE — Progress Notes (Signed)
    [facility] for Infectious Disease   Reason for visit: Follow up on fever  Interval History: some improvement in mentation, though some confusion remains  Physical Exam: Constitutional:  Vitals:   08/14/16 0329 08/14/16 0726  BP:  117/66  Pulse:  (!) 107  Resp: 12 10  Temp:  99.5 F (37.5 C)  awake, appears uncomfortable Eyes: anicteric HENT: + trach Respiratory: CTA B, anterior exam Cardiovascular: tachy RR GI: soft, nt, nd  Review of Systems: Unable to be assessed due to mental status  Lab Results  Component Value Date   WBC 28.2 (H) 08/14/2016   HGB 9.1 (L) 08/14/2016   HCT 29.6 (L) 08/14/2016   MCV 84.3 08/14/2016   PLT 405 (H) 08/14/2016    Lab Results  Component Value Date   CREATININE 0.88 08/14/2016   BUN 25 (H) 08/14/2016   NA 130 (L) 08/14/2016   K 5.1 08/14/2016   CL 102 08/14/2016   CO2 21 (L) 08/14/2016    Lab Results  Component Value Date   ALT 9 (L) 08/12/2016   AST 13 (L) 08/12/2016   ALKPHOS 106 08/12/2016     Microbiology: Recent Results (from the past 240 hour(s))  Urine culture     Status: Abnormal   Collection Time: 08/09/16  9:05 PM  Result Value Ref Range Status   Specimen Description URINE, RANDOM  Final   Special Requests NONE  Final   Culture MULTIPLE SPECIES PRESENT, SUGGEST RECOLLECTION (A)  Final   Report Status 08/11/2016 FINAL  Final  Blood Culture (routine x 2)     Status: None (Preliminary result)   Collection Time: 08/09/16 10:30 PM  Result Value Ref Range Status   Specimen Description BLOOD LEFT HAND  Final   Special Requests IN PEDIATRIC BOTTLE 1CC  Final   Culture NO GROWTH 4 DAYS  Final   Report Status PENDING  Incomplete  Blood Culture (routine x 2)     Status: None (Preliminary result)   Collection Time: 08/09/16 10:45 PM  Result Value Ref Range Status   Specimen Description BLOOD RIGHT HAND  Final   Special Requests IN PEDIATRIC BOTTLE 2ML  Final   Culture NO GROWTH 4 DAYS  Final   Report Status  PENDING  Incomplete  Culture, blood (Routine X 2) w Reflex to ID Panel     Status: None (Preliminary result)   Collection Time: 08/11/16 11:40 AM  Result Value Ref Range Status   Specimen Description BLOOD LEFT ANTECUBITAL  Final   Special Requests IN PEDIATRIC BOTTLE 1CC  Final   Culture NO GROWTH 2 DAYS  Final   Report Status PENDING  Incomplete  Culture, blood (Routine X 2) w Reflex to ID Panel     Status: None (Preliminary result)   Collection Time: 08/11/16 11:45 AM  Result Value Ref Range Status   Specimen Description BLOOD BLOOD LEFT HAND  Final   Special Requests IN PEDIATRIC BOTTLE 3CC  Final   Culture NO GROWTH 2 DAYS  Final   Report Status PENDING  Incomplete    Impression/Plan:  1. AMS - ? If infection. On aztreonam empirically.  Will continue for now.  ? If aspirating.  2. History of MDRO - if he worsens, will consider avibactam. 3.  Fever - has remained afebrile now.  No hypoxia and less secretions.   4.  Possible aspiration - swallow evaluation today.

## 2016-08-14 NOTE — Progress Notes (Signed)
Inpatient Diabetes Program Recommendations  AACE/ADA: New Consensus Statement on Inpatient Glycemic Control (2015)  Target Ranges:  Prepandial:   less than 140 mg/dL      Peak postprandial:   less than 180 mg/dL (1-2 hours)      Critically ill patients:  140 - 180 mg/dL  Results for FADI, HILGERT (MRN [id number]) as of 08/14/2016 09:14  Ref. Range 08/13/2016 07:23 08/13/2016 12:27 08/13/2016 17:08 08/13/2016 21:14 08/14/2016 07:24  Glucose-Capillary Latest Ref Range: 65 - 99 mg/dL 232 (H) 177 (H) 192 (H) 177 (H) 370 (H)    Review of Glycemic Control  Diabetes history: DM1 Outpatient Diabetes medications: Lantus 22 units QHS, Novolog 0-10 units QID Current orders for Inpatient glycemic control: Lantus 8 units daily, Novolog 0-15 units TID with meals  Inpatient Diabetes Program Recommendations: Insulin - Basal: Fasting glucose 370 mg/dl this morning. Please consider increasing Lantus to 13 units daily (based on 51 kg x 0.25 units). Correction (SSI): Please consider decreasing Novolog correction to sensitive scale and changing frequency to Q4H. Insulin - Meal Coverage: Please consider ordering Novolog 3 units BID (at 12 am and 4 am) for tube feeding coverage at night. Diet: Please consider discontinuing REGULAR diet and ordering Carb Modified diet.  Thanks, Barnie Alderman, RN, MSN, CDE Diabetes Coordinator Inpatient Diabetes Program [phone] (Team Pager from 8am to 5pm)

## 2016-08-14 NOTE — Progress Notes (Signed)
[facility] PROGRESS NOTE  Jason [name] [id number] DOB: [birth year] DOA: 08/09/2016  PCP: [name], MD  Brief History/Interval Summary: 30 year old African-American male who was last hospitalized here about a week ago for sacral wound infection and who has a past medical history of C6 spinal cord injury, chronic trach and PEG and ostomy, diabetes mellitus type 1. Patient presented to the emergency department as his home care nurse was concerned about patient's wound. The patient's hemoglobin was noted to be low and he had a temperature of 100.7. He was hospitalized for further management. His stay was complicated by a fever of 102F and also by episodes of hypoglycemia. Subsequently, he became encephalopathic. Evaluation revealed several aspiration pneumonia. ID was reconsulted. Patient now on aztreonam.  Reason for Visit: Anemia. Fever. Hypoglycemia  Consultants: Infectious disease  Procedures: None  Antibiotics: Patient was getting cefepime at home. This was discontinued on 11/3. Aztreonam started on 11/6  Subjective/Interval History: Patient appears to be more awake today. Still appears to be confused. His mother is at the bedside. He is not answering any questions.   ROS: Unable to do due to confusion  Objective:  Vital Signs  Vitals:   08/14/16 0448 08/14/16 0726 08/14/16 0907 08/14/16 0910  BP:  117/66    Pulse:  (!) 107  100  Resp:  10  13  Temp:  99.5 F (37.5 C)    TempSrc:  Oral    SpO2:  98% 100% 100%  Weight: 51.3 kg (113 lb)     Height:        Intake/Output Summary (Last 24 hours) at 08/14/16 1138 Last data filed at 08/14/16 0727  Gross per 24 hour  Intake          1960.83 ml  Output             1913 ml  Net            47.83 ml   Filed Weights   08/12/16 0500 08/13/16 0413 08/14/16 0448  Weight: 49.9 kg (110 lb) 51.7 kg (114 lb) 51.3 kg (113 lb)    General appearance: More awake and alert today, although remains delirious.  Resp:  clear to auscultation bilaterally. Lurline Idol is noted. Appears to be mildly tachypneic. No definite crackles or wheezing. Cardio: regular rate and rhythm, S1, S2 normal, no murmur, click, rub or gallop GI: soft, non-tender; bowel sounds normal; no masses,  no organomegaly. PEG tube is present. Ostomy is present. Neurologic: Quadriplegic  Lab Results:  Data Reviewed: I have personally reviewed following labs and imaging studies  CBC:  Recent Labs Lab 08/09/16 2105 08/10/16 0400 08/11/16 0920 08/12/16 0500 08/13/16 0800 08/14/16 0447  WBC 24.2* 25.2* 23.1* 22.9* 24.2* 28.2*  NEUTROABS 19.6*  --   --   --   --   --   HGB 7.0* 6.7* 9.4* 9.4* 8.6* 9.1*  HCT 22.5* 22.1* 29.5* 30.2* 27.6* 29.6*  MCV 84.0 85.0 83.8 84.1 84.4 84.3  PLT 474* 444* 334 301 296 405*    Basic Metabolic Panel:  Recent Labs Lab 08/09/16 2105 08/11/16 0920 08/12/16 0500 08/13/16 0730 08/14/16 0447  NA 131* 130* 130* 129* 130*  K 4.0 4.0 4.2 4.7 5.1  CL 104 103 102 100* 102  CO2 20* 21* 22 23 21*  GLUCOSE 81 78 162* 254* 303*  BUN 19 20 18  22* 25*  CREATININE 0.62 0.52* 0.64 0.73 0.88  CALCIUM 9.2 8.7* 8.5* 8.7* 9.0    GFR: Estimated Creatinine  Clearance: 87.4 mL/min (by C-G formula based on SCr of 0.88 mg/dL).  Liver Function Tests:  Recent Labs Lab 08/09/16 2105 08/12/16 0500  AST 13* 13*  ALT 8* 9*  ALKPHOS 110 106  BILITOT 0.5 0.3  PROT 6.5 6.3*  ALBUMIN 1.8* 1.5*    CBG:  Recent Labs Lab 08/13/16 0723 08/13/16 1227 08/13/16 1708 08/13/16 2114 08/14/16 0724  GLUCAP 232* 177* 192* 177* 370*     Radiology Studies: Ct Head Wo Contrast  Result Date: 08/13/2016 CLINICAL DATA:  30 year old male with altered mental status. Subsequent encounter. EXAM: CT HEAD WITHOUT CONTRAST TECHNIQUE: Contiguous axial images were obtained from the base of the skull through the vertex without intravenous contrast. COMPARISON:  07/24/2016 head CT.  08/17/2014 brain MR. FINDINGS: Brain: No  intracranial hemorrhage or CT evidence of large acute infarct. Mild global atrophy without hydrocephalus. No CT evidence of encephalitis or meningitis however, if this were of clinical concern, further investigation may be necessary. No intracranial mass lesion noted on this unenhanced exam. Vascular: No hyperdense vessel. Skull: No destructive lesion. Sinuses/Orbits: No acute orbital abnormality. Mild mucosal thickening ethmoid sinus air cells, sphenoid sinuses and maxillary sinuses. Other: Caries. IMPRESSION: No acute intracranial abnormality. Mild mucosal thickening ethmoid sinus air cells, sphenoid sinuses and maxillary sinuses. Please see above. Electronically Signed   By: Genia Del M.D.   On: 08/13/2016 12:42   Ct Chest Wo Contrast  Result Date: 08/13/2016 CLINICAL DATA:  Possible pneumonia EXAM: CT CHEST WITHOUT CONTRAST TECHNIQUE: Multidetector CT imaging of the chest was performed following the standard protocol without IV contrast. COMPARISON:  Chest x-ray 08/13/2016 FINDINGS: Cardiovascular: Borderline cardiomegaly is noted. No pericardial effusion. There is right arm PICC line with tip in right atrium. Mediastinum/Nodes: No mediastinal adenopathy is noted on this unenhanced scan. Moderate size hiatal hernia measures 5.1 cm. Tracheostomy tube partially visualized. Lungs/Pleura: Images of the lung parenchyma shows consolidation with air bronchogram in right lower lobe consistent with pneumonia. Small patchy infiltrate with air bronchogram noted in left lower lobe posterior medially. Small patchy infiltrate in posterior aspect right upper lobe. Central airways are patent. Upper Abdomen: The visualized upper abdomen shows no adrenal gland mass. Nonspecific mild left perinephric stranding. Clinical correlation is necessary. Musculoskeletal: No destructive bony lesions are noted. Sagittal images of the spine shows mild compression deformities T7-T8 T10 and T12 vertebral bodies. There is probable  healing fracture and Schmorl's node deformity upper endplate of T8 and upper endplate of L1 vertebral body. Clinical correlation is necessary. Further correlation with MRI could be performed as clinically warranted. IMPRESSION: 1. Moderate size hiatal hernia. 2. There is consolidation with air bronchogram in right lower lobe consistent with pneumonia. Small patchy infiltrate/pneumonia in noted in left lower lobe posterior medially. Small patchy infiltrate in posterior aspect of the right upper lobe. No mediastinal hematoma or adenopathy. 3. Tracheostomy tube in place. 4. Sagittal images of the spine shows mild compression deformities T7-T8 T10 and T12 vertebral bodies. There is probable healing fracture and Schmorl's node deformity upper endplate of T8 and upper endplate of L1 vertebral body. Less likely osteomyelitis. Clinical correlation is necessary. Further correlation with MRI could be performed as clinically warranted. Electronically Signed   By: Lahoma Crocker M.D.   On: 08/13/2016 13:02   Dg Chest Port 1 View  Result Date: 08/13/2016 CLINICAL DATA:  Dyspnea.  Lung congestion. EXAM: PORTABLE CHEST 1 VIEW COMPARISON:  07/24/2016 FINDINGS: Tracheostomy tube tip is above the carina. There is a right arm PICC line  with tip in the inferior right atrium. Normal heart size. Small right pleural effusion is again noted. Persistent airspace consolidation within the right lower lobe. IMPRESSION: Persistent right lower lobe pneumonia. Low position of right arm PICC line with tip in the inferior right atrium. Electronically Signed   By: Kerby Moors M.D.   On: 08/13/2016 08:11     Medications:  Scheduled: . sodium chloride   Intravenous Once  . albuterol  2.5 mg Nebulization BID  . aztreonam  1 g Intravenous Q8H   And  . famotidine  10 mg Oral Q8H   And  . methylPREDNISolone (SOLU-MEDROL) injection  20 mg Intravenous Q8H  . baclofen  20 mg Oral QID  . buPROPion  150 mg Oral Daily  . diclofenac sodium  2 g  Topical BID  . docusate  100 mg Oral Daily  . enoxaparin (LOVENOX) injection  40 mg Subcutaneous Q24H  . famotidine  20 mg Oral BID  . fentaNYL  75 mcg Transdermal Q72H  . ferrous sulfate  325 mg Oral Q breakfast  . free water  300-400 mL Per Tube Q8H  . insulin aspart  0-15 Units Subcutaneous TID WC  . insulin glargine  8 Units Subcutaneous Daily  . loratadine  10 mg Oral QHS  . magnesium oxide  400 mg Per Tube BID  . midodrine  20 mg Oral TID WC  . multivitamin with minerals  1 tablet Per Tube Daily  . mupirocin ointment   Topical BID  . oxybutynin  5 mg Per Tube BID  . pantoprazole  80 mg Oral Daily  . pregabalin  100 mg Oral TID WC  . pregabalin  200 mg Oral QHS  . sodium chloride  500 mL Intravenous Once  . sodium chloride flush  10-40 mL Intracatheter Q12H   Continuous: . 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 Patient was restarted on antibiotics yesterday. There could also be an element of dehydration, so he has been given fluids as well. Slightly better this morning but still remains encephalopathic. CT head did not show any acute findings. Patient is also noted to be on multiple medications that can cause alteration in mental status. His Ativan was discontinued. He is on narcotics. However,  he has been on the same dose for a long period of time. Since he appears to be improving we'll hold off on making any other changes to his pain medications.   Right lower lobe pneumonia, likely aspiration Aspiration could have occurred while he was hypoglycemic. Patient does have a trach. However, he does eat by mouth. Reasonable to get a swallow evaluation. Continue aztreonam as ordered by ID. Hold sedative agents.   Sinus tachycardia. Patient has had poor oral intake for the last 2 days. He is noted to have low-grade fever. These could be the reasons for his tachycardia. Heart rate has improved  with IV fluid boluses. He'll be given more fluid today.   Sacral decubitus ulcer with chronic osteomyelitis. Home care nurse was concerned about patient's wound. Seen by our wound care nurse. She knows the patient from his previous hospitalizations. She feels that the wound appears to be clean without any evidence for infection at this time. Patient has been on cefepime at home. Patient seen by infectious disease. Cefepime was discontinued on November 3. WBC remains elevated.   History of diabetes mellitus type 1 with hypoglycemia Hypoglycemia was refractory to D5. He was started on D10 with which his blood sugars stabilized. D10 was stopped 11/5. Blood glucose levels remain elevated at 400, so he was placed back on his Lantus but at lower dose. Continue Lantus. Will see how his CBGs did today and then adjust dose tomorrow morning. Last HbA1c is 8.0 September.  Fever Patient developed a temperature of 102F on the morning of 11/4. Hasn't had any recurrence of fever. Cultures are negative so far. Fever could've been due to aspiration.   Chronic hypotension. Continue with midodrine.   Anemia of chronic disease. Patient was transfused 2 units of PRBC. Hemoglobin has responded appropriately. Continue to monitor.   Chronic pain syndrome. Continue home medications.  Functional quadriplegia. Stable. He  has a trach. He has a PICC tube and an ostomy. He gets feeding through the night according to the mother. However, he is able to tolerate orally as well. Nutrition is consulted. RT is following as well for trach.  DVT Prophylaxis: Lovenox    Code Status: Full code  Family Communication: Discussed with his mother  Disposition Plan: Management as outlined above. He will remain in step down unit for now.    LOS: 3 days   [facility] Pager [phone] 08/14/2016, 11:38 AM  If 7PM-7AM, please contact night-coverage at [email or web address], password [facility]

## 2016-08-15 DIAGNOSIS — D649 Anemia, unspecified: Secondary | ICD-10-CM

## 2016-08-15 DIAGNOSIS — J690 Pneumonitis due to inhalation of food and vomit: Secondary | ICD-10-CM

## 2016-08-15 DIAGNOSIS — L89154 Pressure ulcer of sacral region, stage 4: Secondary | ICD-10-CM

## 2016-08-15 DIAGNOSIS — J984 Other disorders of lung: Secondary | ICD-10-CM

## 2016-08-15 DIAGNOSIS — M86651 Other chronic osteomyelitis, right thigh: Secondary | ICD-10-CM

## 2016-08-15 DIAGNOSIS — D638 Anemia in other chronic diseases classified elsewhere: Secondary | ICD-10-CM

## 2016-08-15 DIAGNOSIS — I9589 Other hypotension: Secondary | ICD-10-CM

## 2016-08-15 LAB — BASIC METABOLIC PANEL
ANION GAP: 6 (ref 5–15)
Anion gap: 7 (ref 5–15)
BUN: 28 mg/dL — AB (ref 6–20)
BUN: 32 mg/dL — AB (ref 6–20)
CHLORIDE: 105 mmol/L (ref 101–111)
CHLORIDE: 106 mmol/L (ref 101–111)
CO2: 21 mmol/L — ABNORMAL LOW (ref 22–32)
CO2: 19 mmol/L — AB (ref 22–32)
CREATININE: 0.77 mg/dL (ref 0.61–1.24)
Calcium: 8.7 mg/dL — ABNORMAL LOW (ref 8.9–10.3)
Calcium: 8.5 mg/dL — ABNORMAL LOW (ref 8.9–10.3)
Creatinine, Ser: 0.72 mg/dL (ref 0.61–1.24)
GFR calc Af Amer: >60 mL/min (ref >60)
GFR calc Af Amer: >60 mL/min (ref >60)
GFR calc non Af Amer: >60 mL/min (ref >60)
GFR calc non Af Amer: >60 mL/min (ref >60)
GLUCOSE: 423 mg/dL — AB (ref 65–99)
Glucose, Bld: 418 mg/dL — ABNORMAL HIGH (ref 65–99)
POTASSIUM: 5.4 mmol/L — AB (ref 3.5–5.1)
POTASSIUM: 4.5 mmol/L (ref 3.5–5.1)
SODIUM: 132 mmol/L — AB (ref 135–145)
SODIUM: 132 mmol/L — AB (ref 135–145)

## 2016-08-15 LAB — CBC
HCT: 31.2 % — ABNORMAL LOW (ref 39.0–52.0)
HEMOGLOBIN: 9.8 g/dL — AB (ref 13.0–17.0)
MCH: 26.8 pg (ref 26.0–34.0)
MCHC: 31.4 g/dL (ref 30.0–36.0)
MCV: 85.5 fL (ref 78.0–100.0)
Platelets: 453 10*3/uL — ABNORMAL HIGH (ref 150–400)
RBC: 3.65 MIL/uL — AB (ref 4.22–5.81)
RDW: 17.4 % — ABNORMAL HIGH (ref 11.5–15.5)
WBC: 37 10*3/uL — AB (ref 4.0–10.5)

## 2016-08-15 LAB — GLUCOSE, CAPILLARY
GLUCOSE-CAPILLARY: 428 mg/dL — AB (ref 65–99)
GLUCOSE-CAPILLARY: 462 mg/dL — AB (ref 65–99)
Glucose-Capillary: 412 mg/dL — ABNORMAL HIGH (ref 65–99)
Glucose-Capillary: 319 mg/dL — ABNORMAL HIGH (ref 65–99)
Glucose-Capillary: 203 mg/dL — ABNORMAL HIGH (ref 65–99)

## 2016-08-15 MED ORDER — INSULIN GLARGINE 100 UNIT/ML ~~LOC~~ SOLN
12.0000 [IU] | Freq: Every day | SUBCUTANEOUS | Status: DC
  Administered 2016-08-16 – 2016-08-18 (×3): 12 [IU] via SUBCUTANEOUS
  Filled 2016-08-15 (×3): qty 0.12

## 2016-08-15 MED ORDER — INSULIN GLARGINE 100 UNIT/ML ~~LOC~~ SOLN
4.0000 [IU] | Freq: Once | SUBCUTANEOUS | Status: AC
  Administered 2016-08-15: 4 [IU] via SUBCUTANEOUS
  Filled 2016-08-15: qty 0.04

## 2016-08-15 MED ORDER — SODIUM POLYSTYRENE SULFONATE 15 GM/60ML PO SUSP
30.0000 g | Freq: Once | ORAL | Status: AC
  Administered 2016-08-15: 30 g
  Filled 2016-08-15: qty 120

## 2016-08-15 MED ORDER — INSULIN ASPART 100 UNIT/ML ~~LOC~~ SOLN
17.0000 [IU] | Freq: Once | SUBCUTANEOUS | Status: AC
  Administered 2016-08-15: 17 [IU] via SUBCUTANEOUS

## 2016-08-15 NOTE — Progress Notes (Signed)
PROGRESS NOTE  Jason [name]  [id number] DOB: 1986-07-02 DOA: 08/09/2016 PCP: [name], MD Outpatient Specialists:  Subjective: Seen with his mother at bedside, patient is awake but confused.  Brief Narrative:  30 year old African-American male who was last hospitalized here about a week ago for sacral wound infection and who has a past medical history of C6 spinal cord injury, chronic trach and PEG and ostomy, diabetes mellitus type 1. Patient presented to the emergency department as his home care nurse was concerned about patient's wound. The patient's hemoglobin was noted to be low and he had a temperature of 100.7. He was hospitalized for further management. His stay was complicated by a fever of 102F and also by episodes of hypoglycemia. Subsequently, he became encephalopathic. Evaluation revealed several aspiration pneumonia. ID was reconsulted. Patient now on aztreonam.  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 -This is could be secondary to infectious process which thought to be aspiration pneumonia. -CT head without acute findings, medications that causes altered mental status is continued. -Improving but not back to baseline per his mother at bedside.  Right lower lobe pneumonia, likely aspiration -RLL pneumonia per CXR, high risk for aspiration, has tracheostomy (he still eats regular diet) and quadriplegic -Had episode of  hypoglycemia, which could prove predisposing to aspiration. -SLP to evaluate on his mentation improves.  Sinus tachycardia. Patient has had poor oral intake for the last 2 days. He is noted to have low-grade fever. These could be the reasons for his tachycardia. Heart rate has improved with IV fluid boluses. He'll be given more fluid today.   Sacral decubitus ulcer with chronic osteomyelitis. -Home care nurse was concerned about patient's wound.  -Seen by our wound care nurse. She knows the patient from his previous hospitalizations.  -She feels that the wound appears to be clean without any evidence for acute infection at this time.  -Patient has been on cefepime at home. Patient seen by infectious disease. Cefepime was discontinued on November 3.  History of diabetes mellitus type 1 with hypoglycemia -Hypoglycemic episode with blood sugar down to 13 on 11/4 at 3:33 AM.  -He was started on D10 with which his blood sugars stabilized. D10 was stopped 11/5. -Very labile blood glucoses, will increase Lantus to 12 units. He receives Solu-Medrol with antibiotics.  Fever Patient developed a temperature of 102F on the morning of 11/4. Hasn't had any recurrence of fever. Cultures are negative so far. Fever could've been due to aspiration.   Chronic hypotension. Continue with midodrine.   Anemia of chronic disease. Patient was transfused 2 units of PRBC. Hemoglobin has responded appropriately. Continue to monitor.   Chronic pain syndrome. Continue home medications.  Functional quadriplegia. Stable. He has a trach. He has a PICC tube and an ostomy. He gets feeding through the night according to the mother. However, he is able to tolerate orally as well. Nutrition is consulted. RT is following as well for trach.   DVT  prophylaxis: Lovenox Code Status: Full Code Family Communication: Discussed with his mother at bedside. Disposition Plan:  Diet: Diet regular Room service  appropriate? Yes; Fluid consistency: Thin  Consultants:   ID  Urology  Procedures:   None  Antimicrobials:   Aztreonam  Objective: Vitals:   08/15/16 0351 08/15/16 0500 08/15/16 0752 08/15/16 1022  BP:   111/81   Pulse: 90  97 (!) 110  Resp: 12  (!) 9 13  Temp:   98.3 F (36.8 C)   TempSrc:   Oral   SpO2:   99% 100%  Weight:  52.6 kg (116 lb)    Height:        Intake/Output Summary (Last 24 hours) at 08/15/16 1027 Last data filed at 08/15/16 0600  Gross per 24 hour  Intake          4093.83 ml  Output             3375 ml  Net           718.83 ml   Filed Weights   08/13/16 0413 08/14/16 0448 08/15/16 0500  Weight: 51.7 kg (114 lb) 51.3 kg (113 lb) 52.6 kg (116 lb)    Examination: General exam: Appears calm and comfortable  Respiratory system: Clear to auscultation. Respiratory effort normal. Cardiovascular system: S1 & S2 heard, RRR. No JVD, murmurs, rubs, gallops or clicks. No pedal edema. Gastrointestinal system: Abdomen is nondistended, soft and nontender. No organomegaly or masses felt. Normal bowel sounds heard. Central nervous system: Alert and oriented. No focal neurological deficits. Extremities: Symmetric 5 x 5 power. Skin: No rashes, lesions or ulcers Psychiatry: Judgement and insight appear normal. Mood & affect appropriate.   Data Reviewed: I have personally reviewed following labs and imaging studies  CBC:  Recent Labs Lab 08/09/16 2105  08/11/16 0920 08/12/16 0500 08/13/16 0800 08/14/16 0447 08/15/16 0507  WBC 24.2*  < > 23.1* 22.9* 24.2* 28.2* 37.0*  NEUTROABS 19.6*  --   --   --   --   --   --   HGB 7.0*  < > 9.4* 9.4* 8.6* 9.1* 9.8*  HCT 22.5*  < > 29.5* 30.2* 27.6* 29.6* 31.2*  MCV 84.0  < > 83.8 84.1 84.4 84.3 85.5  PLT 474*  < > 334 301 296 405* 453*  < > = values in this interval not displayed. Basic Metabolic Panel:  Recent Labs Lab 08/11/16 0920 08/12/16 0500 08/13/16 0730 08/14/16 0447 08/15/16 0507  NA 130* 130*  129* 130* 132*  K 4.0 4.2 4.7 5.1 5.4*  CL 103 102 100* 102 105  CO2 21* 22 23 21* 21*  GLUCOSE 78 162* 254* 303* 418*  BUN 20 18 22* 25* 28*  CREATININE 0.52* 0.64 0.73 0.88 0.72  CALCIUM 8.7* 8.5* 8.7* 9.0 8.7*   GFR: Estimated Creatinine Clearance: 98.6 mL/min (by C-G formula based on SCr of 0.72 mg/dL). Liver Function Tests:  Recent Labs Lab 08/09/16 2105 08/12/16 0500  AST 13* 13*  ALT 8* 9*  ALKPHOS 110 106  BILITOT 0.5 0.3  PROT 6.5 6.3*  ALBUMIN 1.8* 1.5*   No results for input(s): LIPASE, AMYLASE in the last 168 hours. No results for input(s): AMMONIA in the last 168 hours. Coagulation Profile: No results for input(s): INR, PROTIME in the last 168 hours. Cardiac Enzymes: No results for input(s): CKTOTAL, CKMB, CKMBINDEX, TROPONINI in the last 168 hours. BNP (last 3 results) No results for input(s): PROBNP in the last 8760 hours. HbA1C:  No results for input(s): HGBA1C in the last 72 hours. CBG:  Recent Labs Lab 08/14/16 0724 08/14/16 1237 08/14/16 1722 08/14/16 2217 08/15/16 0746  GLUCAP 370* 343* 269* 207* 428*   Lipid Profile: No results for input(s): CHOL, HDL, LDLCALC, TRIG, CHOLHDL, LDLDIRECT in the last 72 hours. Thyroid Function Tests: No results for input(s): TSH, T4TOTAL, FREET4, T3FREE, THYROIDAB in the last 72 hours. Anemia Panel: No results for input(s): VITAMINB12, FOLATE, FERRITIN, TIBC, IRON, RETICCTPCT in the last 72 hours. Urine analysis:    Component Value Date/Time   COLORURINE YELLOW 08/11/2016 1205   APPEARANCEUR CLOUDY (A) 08/11/2016 1205   LABSPEC 1.015 08/11/2016 1205   PHURINE 5.5 08/11/2016 1205   GLUCOSEU NEGATIVE 08/11/2016 1205   HGBUR MODERATE (A) 08/11/2016 1205   BILIRUBINUR NEGATIVE 08/11/2016 1205   BILIRUBINUR neg 06/28/2014 1445   KETONESUR NEGATIVE 08/11/2016 1205   PROTEINUR 100 (A) 08/11/2016 1205   UROBILINOGEN 0.2 07/27/2015 0645   NITRITE NEGATIVE 08/11/2016 1205   LEUKOCYTESUR SMALL (A) 08/11/2016  1205   Sepsis Labs: @LABRCNTIP (procalcitonin:4,lacticidven:4)  ) Recent Results (from the past 240 hour(s))  Urine culture     Status: Abnormal   Collection Time: 08/09/16  9:05 PM  Result Value Ref Range Status   Specimen Description URINE, RANDOM  Final   Special Requests NONE  Final   Culture MULTIPLE SPECIES PRESENT, SUGGEST RECOLLECTION (A)  Final   Report Status 08/11/2016 FINAL  Final  Blood Culture (routine x 2)     Status: None   Collection Time: 08/09/16 10:30 PM  Result Value Ref Range Status   Specimen Description BLOOD LEFT HAND  Final   Special Requests IN PEDIATRIC BOTTLE 1CC  Final   Culture NO GROWTH 5 DAYS  Final   Report Status 08/14/2016 FINAL  Final  Blood Culture (routine x 2)     Status: None   Collection Time: 08/09/16 10:45 PM  Result Value Ref Range Status   Specimen Description BLOOD RIGHT HAND  Final   Special Requests IN PEDIATRIC BOTTLE 2ML  Final   Culture NO GROWTH 5 DAYS  Final   Report Status 08/14/2016 FINAL  Final  Culture, blood (Routine X 2) w Reflex to ID Panel     Status: None (Preliminary result)   Collection Time: 08/11/16 11:40 AM  Result Value Ref Range Status   Specimen Description BLOOD LEFT ANTECUBITAL  Final   Special Requests IN PEDIATRIC BOTTLE 1CC  Final   Culture NO GROWTH 3 DAYS  Final   Report Status PENDING  Incomplete  Culture, blood (Routine X 2) w Reflex to ID Panel     Status: None (Preliminary result)   Collection Time: 08/11/16 11:45 AM  Result Value Ref Range Status   Specimen Description BLOOD BLOOD LEFT HAND  Final   Special Requests IN PEDIATRIC BOTTLE 3CC  Final   Culture NO GROWTH 3 DAYS  Final   Report Status PENDING  Incomplete     Invalid input(s): PROCALCITONIN, LACTICACIDVEN   Radiology Studies: Ct Head Wo Contrast  Result Date: 08/13/2016 CLINICAL DATA:  30 year old male with altered mental status. Subsequent encounter. EXAM: CT HEAD WITHOUT CONTRAST TECHNIQUE: Contiguous axial images were  obtained from the base of the skull through the vertex without intravenous contrast. COMPARISON:  07/24/2016 head CT.  08/17/2014 brain MR. FINDINGS: Brain: No intracranial hemorrhage or CT evidence of large acute infarct. Mild global atrophy without hydrocephalus. No CT evidence of encephalitis or meningitis however, if this were of clinical concern, further  investigation may be necessary. No intracranial mass lesion noted on this unenhanced exam. Vascular: No hyperdense vessel. Skull: No destructive lesion. Sinuses/Orbits: No acute orbital abnormality. Mild mucosal thickening ethmoid sinus air cells, sphenoid sinuses and maxillary sinuses. Other: Caries. IMPRESSION: No acute intracranial abnormality. Mild mucosal thickening ethmoid sinus air cells, sphenoid sinuses and maxillary sinuses. Please see above. Electronically Signed   By: Genia Del M.D.   On: 08/13/2016 12:42   Ct Chest Wo Contrast  Result Date: 08/13/2016 CLINICAL DATA:  Possible pneumonia EXAM: CT CHEST WITHOUT CONTRAST TECHNIQUE: Multidetector CT imaging of the chest was performed following the standard protocol without IV contrast. COMPARISON:  Chest x-ray 08/13/2016 FINDINGS: Cardiovascular: Borderline cardiomegaly is noted. No pericardial effusion. There is right arm PICC line with tip in right atrium. Mediastinum/Nodes: No mediastinal adenopathy is noted on this unenhanced scan. Moderate size hiatal hernia measures 5.1 cm. Tracheostomy tube partially visualized. Lungs/Pleura: Images of the lung parenchyma shows consolidation with air bronchogram in right lower lobe consistent with pneumonia. Small patchy infiltrate with air bronchogram noted in left lower lobe posterior medially. Small patchy infiltrate in posterior aspect right upper lobe. Central airways are patent. Upper Abdomen: The visualized upper abdomen shows no adrenal gland mass. Nonspecific mild left perinephric stranding. Clinical correlation is necessary. Musculoskeletal: No  destructive bony lesions are noted. Sagittal images of the spine shows mild compression deformities T7-T8 T10 and T12 vertebral bodies. There is probable healing fracture and Schmorl's node deformity upper endplate of T8 and upper endplate of L1 vertebral body. Clinical correlation is necessary. Further correlation with MRI could be performed as clinically warranted. IMPRESSION: 1. Moderate size hiatal hernia. 2. There is consolidation with air bronchogram in right lower lobe consistent with pneumonia. Small patchy infiltrate/pneumonia in noted in left lower lobe posterior medially. Small patchy infiltrate in posterior aspect of the right upper lobe. No mediastinal hematoma or adenopathy. 3. Tracheostomy tube in place. 4. Sagittal images of the spine shows mild compression deformities T7-T8 T10 and T12 vertebral bodies. There is probable healing fracture and Schmorl's node deformity upper endplate of T8 and upper endplate of L1 vertebral body. Less likely osteomyelitis. Clinical correlation is necessary. Further correlation with MRI could be performed as clinically warranted. Electronically Signed   By: Lahoma Crocker M.D.   On: 08/13/2016 13:02        Scheduled Meds: . albuterol  2.5 mg Nebulization BID  . aztreonam  1 g Intravenous Q8H   And  . famotidine  10 mg Oral Q8H   And  . methylPREDNISolone (SOLU-MEDROL) injection  20 mg Intravenous Q8H  . baclofen  20 mg Oral QID  . buPROPion  150 mg Oral Daily  . diclofenac sodium  2 g Topical BID  . docusate  100 mg Oral Daily  . enoxaparin (LOVENOX) injection  40 mg Subcutaneous Q24H  . fentaNYL  75 mcg Transdermal Q72H  . ferrous sulfate  325 mg Oral Q breakfast  . free water  300-400 mL Per Tube Q8H  . insulin aspart  0-15 Units Subcutaneous TID WC  . insulin glargine  8 Units Subcutaneous Daily  . loratadine  10 mg Oral QHS  . magnesium oxide  400 mg Per Tube BID  . midodrine  20 mg Oral TID WC  . multivitamin with minerals  1 tablet Per Tube  Daily  . mupirocin ointment   Topical BID  . oxybutynin  5 mg Per Tube BID  . pantoprazole  80 mg Oral Daily  .  pregabalin  100 mg Oral TID WC  . pregabalin  200 mg Oral QHS  . sodium chloride flush  10-40 mL Intracatheter Q12H   Continuous Infusions: . sodium chloride 75 mL/hr at 08/15/16 0308  . feeding supplement (GLUCERNA 1.2 CAL) 1,000 mL (08/15/16 0001)     LOS: 4 days    Time spent: 35 minutes    Yanet Balliet A, MD [facility] Pager [phone]  If 7PM-7AM, please contact night-coverage [email or web address] Password [facility] 08/15/2016, 10:27 AM

## 2016-08-15 NOTE — Progress Notes (Signed)
Results for KEYTH, KUPEC (MRN [id number]) as of 08/15/2016 11:49  Ref. Range 08/14/2016 07:24 08/14/2016 12:37 08/14/2016 17:22 08/14/2016 22:17 08/15/2016 07:46  Glucose-Capillary Latest Ref Range: 65 - 99 mg/dL 370 (H) 343 (H) 269 (H) 207 (H) 428 (H)  Noted that blood sugars continue to be greater than 300 mg/dl. Patient takes Lantus 22 units daily at home.  May need to increase Lantus to 15-20 units daily. Consider adding Novolog 3 units TID as meal coverage if patient eats at least 50 % of meals. Continue Novolog correction scale TID. If patient not eating, consider making Novolog correction scale every 4 hours. Will continue to monitor blood sugars while in the hospital. Harvel Ricks RN BSN CDE

## 2016-08-15 NOTE — Progress Notes (Signed)
[facility] for [facility]   Reason for visit: Follow up on fever  Interval History: some improvement in mentation, though some confusion remains; remains afebrile  Physical Exam: Constitutional:  Vitals:   08/15/16 0351 08/15/16 0752  BP:  111/81  Pulse: 90 97  Resp: 12 (!) 9  Temp:  98.3 F (36.8 C)  awake, appears comfortable Eyes: anicteric HENT: + trach Respiratory: CTA B, anterior exam Cardiovascular: tachy RR GI: soft, nt, nd  Review of Systems: Unable to be assessed due to mental status  Lab Results  Component Value Date   WBC 37.0 (H) 08/15/2016   HGB 9.8 (L) 08/15/2016   HCT 31.2 (L) 08/15/2016   MCV 85.5 08/15/2016   PLT 453 (H) 08/15/2016    Lab Results  Component Value Date   CREATININE 0.72 08/15/2016   BUN 28 (H) 08/15/2016   NA 132 (L) 08/15/2016   K 5.4 (H) 08/15/2016   CL 105 08/15/2016   CO2 21 (L) 08/15/2016    Lab Results  Component Value Date   ALT 9 (L) 08/12/2016   AST 13 (L) 08/12/2016   ALKPHOS 106 08/12/2016     Microbiology: Recent Results (from the past 240 hour(s))  Urine culture     Status: Abnormal   Collection Time: 08/09/16  9:05 PM  Result Value Ref Range Status   Specimen Description URINE, RANDOM  Final   Special Requests NONE  Final   Culture MULTIPLE SPECIES PRESENT, SUGGEST RECOLLECTION (A)  Final   Report Status 08/11/2016 FINAL  Final  Blood Culture (routine x 2)     Status: None   Collection Time: 08/09/16 10:30 PM  Result Value Ref Range Status   Specimen Description BLOOD LEFT HAND  Final   Special Requests IN PEDIATRIC BOTTLE 1CC  Final   Culture NO GROWTH 5 DAYS  Final   Report Status 08/14/2016 FINAL  Final  Blood Culture (routine x 2)     Status: None   Collection Time: 08/09/16 10:45 PM  Result Value Ref Range Status   Specimen Description BLOOD RIGHT HAND  Final   Special Requests IN PEDIATRIC BOTTLE 2ML  Final   Culture NO GROWTH 5 DAYS  Final   Report Status 08/14/2016 FINAL   Final  Culture, blood (Routine X 2) w Reflex to ID Panel     Status: None (Preliminary result)   Collection Time: 08/11/16 11:40 AM  Result Value Ref Range Status   Specimen Description BLOOD LEFT ANTECUBITAL  Final   Special Requests IN PEDIATRIC BOTTLE 1CC  Final   Culture NO GROWTH 3 DAYS  Final   Report Status PENDING  Incomplete  Culture, blood (Routine X 2) w Reflex to ID Panel     Status: None (Preliminary result)   Collection Time: 08/11/16 11:45 AM  Result Value Ref Range Status   Specimen Description BLOOD BLOOD LEFT HAND  Final   Special Requests IN PEDIATRIC BOTTLE 3CC  Final   Culture NO GROWTH 3 DAYS  Final   Report Status PENDING  Incomplete    Impression/Plan:  1. AMS - ? If infection. On aztreonam empirically.  Will continue for now. Likely a 10 day course if he continues to improve.  2. History of MDRO - if he worsens, will consider avibactam. 3.  Fever - has remained afebrile now.  No hypoxia and less secretions.   4.  Leukocytosis - increased but in the setting of steroids and a high baseline WBC.  Since he is improving overall, I do not feel further work up indicated.

## 2016-08-15 NOTE — Evaluation (Signed)
Clinical/Bedside Swallow Evaluation Patient Details  Name: KADE MAZZIOTTI MRN: [id number] Date of Birth: January 23, 1986  Today's Date: 08/15/2016 Time: SLP Start Time (ACUTE ONLY): 0933 SLP Stop Time (ACUTE ONLY): 0954 SLP Time Calculation (min) (ACUTE ONLY): 21 min  Past Medical History:  Past Medical History:  Diagnosis Date  . Anemia   . Anxiety   . Arthritis   . Asthma   . 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 Colostomy in place [facility])   . Complication of anesthesia    difficulty waking up, will have sudden drop in BP and O2 sats  . Depression   . Diabetic neuropathy ([place])   . Diabetic retinopathy ([place])   . Dysrhythmia    usually due to medications  . Family history of anesthesia complication    Pt mother can't have epidural procedures  . Fibromyalgia   . Gastroparesis   . GERD (gastroesophageal reflux disease)   . H/O constipation   . H/O respiratory failure   . H/O thrombosis   . Hx MRSA infection    on face  . Multiple allergies 11/02/2015  . Pneumonia   . Recurrent UTI 11/02/2015  . Seizures ([place]) 2017   one time during admission in hosptital  . Stroke ([place]) 01/29/2013   spinal stroke ; paraplegic   . Syncope 02/16/2015  . Tracheostomy present ([place])   . Type I diabetes mellitus ([place])    sees Dr. Loanne Drilling    Past Surgical History:  Past Surgical History:  Procedure Laterality Date  . APPLICATION OF A-CELL OF BACK N/A 11/12/2014   Procedure: APPLICATION A CELL AND VAC ;  Surgeon: Theodoro Kos, DO;  Location: [place];  Service: Plastics;  Laterality: N/A;  . APPLICATION OF A-CELL OF BACK N/A 12/08/2014   Procedure: APPLICATION OF A-CELL AND WOUND VAC ;  Surgeon: Theodoro Kos, DO;  Location: [place];  Service: Plastics;  Laterality: N/A;  . APPLICATION OF A-CELL OF CHEST/ABDOMEN N/A 08/04/2015   Procedure: APPLICATION OF A-CELL ;  Surgeon: Loel Lofty Dillingham, DO;  Location: [place];  Service: Plastics;  Laterality: N/A;  . COLOSTOMY     . DEBRIDMENT OF DECUBITUS ULCER N/A 10/04/2014   Procedure: DEBRIDMENT OF DECUBITUS ULCER;  Surgeon: Georganna Skeans, MD;  Location: [place];  Service: General;  Laterality: N/A;  . GASTROSTOMY TUBE PLACEMENT    . HEMATOMA EVACUATION N/A 05/05/2015   Procedure: EVACUATION HEMATOMA bedside procedure;  Surgeon: Theodoro Kos, DO;  Location: [place];  Service: Plastics;  Laterality: N/A;  . INCISION AND DRAINAGE OF WOUND N/A 11/12/2014   Procedure: IRRIGATION AND DEBRIDEMENT OF WOUNDS WITH BONE BIOPSY AND SURGICAL PREP ;  Surgeon: Theodoro Kos, DO;  Location: [place];  Service: Plastics;  Laterality: N/A;  . INCISION AND DRAINAGE OF WOUND N/A 11/18/2014   Procedure: IRRIGATION AND DEBRIDEMENT OF SACRAL ULCER ONLY WITH PLACEMENT OF A CELL AND VAC/ DRESSING CHANGE TO UPPER BACK AREA.;  Surgeon: Theodoro Kos, DO;  Location: [place];  Service: Plastics;  Laterality: N/A;  . INCISION AND DRAINAGE OF WOUND N/A 11/25/2014   Procedure: IRRIGATION AND DEBRIDEMENT OF SACRAL ULCER AND BACK BURN WITH PLACEMENT OF A-CELL;  Surgeon: Theodoro Kos, DO;  Location: [place];  Service: Plastics;  Laterality: N/A;  . INCISION AND DRAINAGE OF WOUND Right 12/08/2014   Procedure: IRRIGATION AND DEBRIDEMENT SACRAL WOUND AND RIGHT ISCHIAL WOUND ;  Surgeon: Theodoro Kos, DO;  Location: [place];  Service: Plastics;  Laterality: Right;  .  INCISION AND DRAINAGE OF WOUND Bilateral 05/05/2015   Procedure: IRRIGATION AND DEBRIDEMENT SACRAL ULCER;  Surgeon: Theodoro Kos, DO;  Location: [place];  Service: Plastics;  Laterality: Bilateral;  . INCISION AND DRAINAGE OF WOUND N/A 08/04/2015   Procedure: IRRIGATION AND DEBRIDEMENT OF SACRAL ULCER AND ;  Surgeon: Loel Lofty Dillingham, DO;  Location: [place];  Service: Plastics;  Laterality: N/A;  . INSERTION OF SUPRAPUBIC CATHETER N/A 10/12/2014   Procedure: INSERTION OF SUPRAPUBIC CATHETER;  Surgeon: Reece Packer, MD;  Location: [place];  Service: Urology;  Laterality: N/A;  . IR GENERIC HISTORICAL   07/11/2016   IR CM INJ ANY COLONIC TUBE W/FLUORO 07/11/2016 Arne Cleveland, MD [facility]-INTERV RAD  . LAPAROSCOPIC DIVERTED COLOSTOMY N/A 10/12/2014   Procedure: LAPAROSCOPIC DIVERTING COLOSTOMY;  Surgeon: Donnie Mesa, MD;  Location: [place];  Service: General;  Laterality: N/A;  . MINOR APPLICATION OF WOUND VAC N/A 11/25/2014   Procedure:  WOUND VAC CHANGE;  Surgeon: Theodoro Kos, DO;  Location: [place];  Service: Plastics;  Laterality: N/A;  . MULTIPLE EXTRACTIONS WITH ALVEOLOPLASTY N/A 08/03/2014   Procedure: MULTIPLE EXTRACTIONS;  Surgeon: Gae Bon, DDS;  Location: [place];  Service: Oral Surgery;  Laterality: N/A;  . RADIOLOGY WITH ANESTHESIA N/A 07/05/2016   Procedure: RADIOLOGY WITH ANESTHESIA MRI OF PELVIS WITH AND WITHOUT;  Surgeon: Medication Radiologist, MD;  Location: [place];  Service: Radiology;  Laterality: N/A;  . TEE WITHOUT CARDIOVERSION N/A 08/17/2014   Procedure: TRANSESOPHAGEAL ECHOCARDIOGRAM (TEE);  Surgeon: Dorothy Spark, MD;  Location: [place];  Service: Cardiovascular;  Laterality: N/A;  . TEE WITHOUT CARDIOVERSION N/A 05/05/2015   Procedure: TRANSESOPHAGEAL ECHOCARDIOGRAM (TEE);  Surgeon: Lelon Perla, MD;  Location: [facility] ENDOSCOPY;  Service: Cardiovascular;  Laterality: N/A;  . TONSILLECTOMY     HPI:  30 year old African-American male who was last hospitalized here about a week ago for sacral wound infection and who has a past medical history of C6 spinal cord injury, chronic trach and PEG and ostomy, diabetes mellitus type 1. Patient presented to the emergency department as his home care nurse was concerned about patient's wound. The patient's hemoglobin was noted to be low and he had a temperature of 100.7. He was hospitalized for further management. His stay was complicated by a fever of 102F and also by episodes of hypoglycemia. Subsequently, he became encephalopathic. Evaluation revealed several aspiration pneumonia. ID was reconsulted. Patient now on aztreonam.    Assessment / Plan / Recommendation Clinical Impression  Pt presents as lethargic and confused during the evaluation. He had PMV in place for PO trials of thin liquids and regular solids. No overt s/s of aspiration were observed at bedside. Mother present reported a detailed h/o pt's swallow and recent difficulties in light of AMS, involving prolonged oral holding. It is possible that these acute changes may have resulted in episodic aspiration. SLP provided education for swallowing precautions and aspiration risks. Recommend continuation of regular diet and thin liquids. Will f/u for diet tolerance as his MS improves.    Aspiration Risk  Moderate aspiration risk    Diet Recommendation Regular;Thin liquid   Liquid Administration via: Cup;Straw Medication Administration: Whole meds with puree Supervision: Full supervision/cueing for compensatory strategies;Patient able to self feed Compensations: Minimize environmental distractions;Slow rate;Small sips/bites;Follow solids with liquid Postural Changes: Seated upright at 90 degrees;Remain upright for at least 30 minutes after po intake    Other  Recommendations Oral Care Recommendations: Oral care BID   Follow up Recommendations Other (comment) (tba)  Frequency and Duration min 2x/week  2 weeks       Prognosis Prognosis for Safe Diet Advancement: Good Barriers to Reach Goals: Cognitive deficits      Swallow Study   General HPI: 30 year old African-American male who was last hospitalized here about a week ago for sacral wound infection and who has a past medical history of C6 spinal cord injury, chronic trach and PEG and ostomy, diabetes mellitus type 1. Patient presented to the emergency department as his home care nurse was concerned about patient's wound. The patient's hemoglobin was noted to be low and he had a temperature of 100.7. He was hospitalized for further management. His stay was complicated by a fever of 102F and also  by episodes of hypoglycemia. Subsequently, he became encephalopathic. Evaluation revealed several aspiration pneumonia. ID was reconsulted. Patient now on aztreonam. Type of Study: Bedside Swallow Evaluation Previous Swallow Assessment: MBS (07/2015)- regular diet, thin liquids Diet Prior to this Study: Regular;Thin liquids Temperature Spikes Noted: No Respiratory Status: Trach Trach Size and Type: With PMSV in place;#6;Cuff;Deflated History of Recent Intubation: No Behavior/Cognition: Requires cueing;Lethargic/Drowsy;Confused Oral Cavity Assessment: Other (comment) (difficult to assess) Oral Care Completed by SLP: No Oral Cavity - Dentition: Other (Comment) (mother reported pt has front 4 bottom teeth) Self-Feeding Abilities: Able to feed self;Needs assist Patient Positioning: Upright in bed Baseline Vocal Quality: Normal    Oral/Motor/Sensory Function Overall Oral Motor/Sensory Function: Other (comment) (difficult to assess)   Ice Chips Ice chips: Not tested   Thin Liquid Thin Liquid: Within functional limits Presentation: Straw    Nectar Thick Nectar Thick Liquid: Not tested   Honey Thick Honey Thick Liquid: Not tested   Puree Puree: Not tested   Solid   GO   Solid: Within functional limits       Ezekiel Slocumb, Student SLP  Shela Leff 08/15/2016,11:27 AM

## 2016-08-16 ENCOUNTER — Inpatient Hospital Stay (HOSPITAL_COMMUNITY): Payer: Medicaid Other

## 2016-08-16 DIAGNOSIS — Z95828 Presence of other vascular implants and grafts: Secondary | ICD-10-CM

## 2016-08-16 DIAGNOSIS — E108 Type 1 diabetes mellitus with unspecified complications: Secondary | ICD-10-CM

## 2016-08-16 DIAGNOSIS — L89324 Pressure ulcer of left buttock, stage 4: Secondary | ICD-10-CM

## 2016-08-16 LAB — GLUCOSE, CAPILLARY
GLUCOSE-CAPILLARY: 132 mg/dL — AB (ref 65–99)
GLUCOSE-CAPILLARY: 200 mg/dL — AB (ref 65–99)
GLUCOSE-CAPILLARY: 210 mg/dL — AB (ref 65–99)
GLUCOSE-CAPILLARY: 236 mg/dL — AB (ref 65–99)
GLUCOSE-CAPILLARY: 253 mg/dL — AB (ref 65–99)
Glucose-Capillary: 200 mg/dL — ABNORMAL HIGH (ref 65–99)
Glucose-Capillary: 101 mg/dL — ABNORMAL HIGH (ref 65–99)

## 2016-08-16 LAB — CBC
HEMATOCRIT: 31.6 % — AB (ref 39.0–52.0)
HEMOGLOBIN: 9.8 g/dL — AB (ref 13.0–17.0)
MCH: 26.4 pg (ref 26.0–34.0)
MCHC: 31 g/dL (ref 30.0–36.0)
MCV: 85.2 fL (ref 78.0–100.0)
Platelets: 508 10*3/uL — ABNORMAL HIGH (ref 150–400)
RBC: 3.71 MIL/uL — AB (ref 4.22–5.81)
RDW: 16.8 % — ABNORMAL HIGH (ref 11.5–15.5)
WBC: 33.5 10*3/uL — ABNORMAL HIGH (ref 4.0–10.5)

## 2016-08-16 LAB — BASIC METABOLIC PANEL
ANION GAP: 7 (ref 5–15)
BUN: 25 mg/dL — ABNORMAL HIGH (ref 6–20)
CALCIUM: 8.8 mg/dL — AB (ref 8.9–10.3)
CO2: 23 mmol/L (ref 22–32)
Chloride: 105 mmol/L (ref 101–111)
Creatinine, Ser: 0.64 mg/dL (ref 0.61–1.24)
GLUCOSE: 322 mg/dL — AB (ref 65–99)
POTASSIUM: 4.4 mmol/L (ref 3.5–5.1)
Sodium: 135 mmol/L (ref 135–145)

## 2016-08-16 LAB — CULTURE, BLOOD (ROUTINE X 2)
CULTURE: NO GROWTH
Culture: NO GROWTH

## 2016-08-16 MED ORDER — INSULIN ASPART 100 UNIT/ML ~~LOC~~ SOLN: 0.0000 [IU] | SUBCUTANEOUS | Status: DC

## 2016-08-16 MED ORDER — DIATRIZOATE MEGLUMINE & SODIUM 66-10 % PO SOLN
30.0000 mL | Freq: Once | ORAL | Status: AC
  Administered 2016-08-16: 30 mL via ORAL
  Filled 2016-08-16: qty 30

## 2016-08-16 MED ORDER — DIATRIZOATE MEGLUMINE & SODIUM 66-10 % PO SOLN
ORAL | Status: AC
  Filled 2016-08-16: qty 30

## 2016-08-16 MED ORDER — INSULIN ASPART 100 UNIT/ML ~~LOC~~ SOLN
0.0000 [IU] | SUBCUTANEOUS | Status: DC
  Administered 2016-08-16: 5 [IU] via SUBCUTANEOUS
  Administered 2016-08-16 (×2): 3 [IU] via SUBCUTANEOUS
  Administered 2016-08-16: 5 [IU] via SUBCUTANEOUS
  Administered 2016-08-16: 8 [IU] via SUBCUTANEOUS
  Administered 2016-08-17: 5 [IU] via SUBCUTANEOUS
  Administered 2016-08-17: 2 [IU] via SUBCUTANEOUS
  Administered 2016-08-18 (×2): 3 [IU] via SUBCUTANEOUS
  Administered 2016-08-18: 2 [IU] via SUBCUTANEOUS

## 2016-08-16 NOTE — Progress Notes (Signed)
Speech Language Pathology Treatment: Dysphagia  Patient Details Name: Jason [name] MRN: [id number] DOB: 11/10/1985 Today's Date: 08/16/2016 Time: 1150-1200 SLP Time Calculation (min) (ACUTE ONLY): 10 min  Assessment / Plan / Recommendation Clinical Impression  Pt consumed regular solids and thin liquids. He showed mildly prolonged mastication with solids that cleared with additional liquid washes. Pt was alert and responsive to all questions. His mentation was improved and he participated in self feeding. SLP provided education for diet tolerance. Recommend continuation of regular diet and thin liquid. No further ST is necessary at this time.   HPI HPI: 30 year old African-American male who was last hospitalized here about a week ago for sacral wound infection and who has a past medical history of C6 spinal cord injury, chronic trach and PEG and ostomy, diabetes mellitus type 1. Patient presented to the emergency department as his home care nurse was concerned about patient's wound. The patient's hemoglobin was noted to be low and he had a temperature of 100.7. He was hospitalized for further management. His stay was complicated by a fever of 102F and also by episodes of hypoglycemia. Subsequently, he became encephalopathic. Evaluation revealed several aspiration pneumonia. ID was reconsulted. Patient now on aztreonam.      SLP Plan  All goals met     Recommendations  Diet recommendations: Regular;Thin liquid Liquids provided via: Straw;Cup Medication Administration: Whole meds with liquid Supervision: Staff to assist with self feeding;Intermittent supervision to cue for compensatory strategies Compensations: Minimize environmental distractions;Slow rate;Small sips/bites;Follow solids with liquid Postural Changes and/or Swallow Maneuvers: Seated upright 90 degrees;Upright 30-60 min after meal                Oral Care Recommendations: Oral care BID Follow up Recommendations: 24  hour supervision/assistance Plan: All goals met       [place], Student SLP  Shela Leff 08/16/2016, 12:15 PM

## 2016-08-16 NOTE — Progress Notes (Signed)
Nutrition Follow-up  DOCUMENTATION CODES:   Not applicable  INTERVENTION:    Continue Glucerna 1.2 via PEG at 106m/h x 12 hours per day  (12 midnight to 12 noon) to provide 1008kcals (58% of kcal needs), 50gm protein (56% of protein needs), 6752mfree water daily.   NUTRITION DIAGNOSIS:   Increased nutrient needs related to wound healing as evidenced by estimated needs.  Ongoing  GOAL:   Patient will meet greater than or equal to 90% of their needs  Met  MONITOR:   PO intake, Labs, Weight trends, TF tolerance, Skin, I & O's  ASSESSMENT:    [age].o. male with medical history significant of C6 spinal cord injury, chronic trach and peg and ostomy, DM1.  Patient presents to the ED with 2 day history of fever. There is concern that infection is located in his sacral and ischial decubitus ulcers.    Patient is receiving home nocturnal TF regimen with Glucerna 1.2 at 70 ml/h x 12 hrs daily. Variable PO intake with 0-100% meal completion. Labs reviewed. Medications reviewed and include MVI.  Diet Order:  Diet regular Room service appropriate? Yes; Fluid consistency: Thin  Skin:  Wound (see comment) (Stg 2 R elbow, back; DTI L heel; stg 4 coccyx, buttocks)  Last BM:  11/8 (ostomy)  Height:   Ht Readings from Last 1 Encounters:  08/11/16 _0  (1.727 m)    Weight:   Wt Readings from Last 1 Encounters:  08/16/16 113 lb (51.3 kg)   08/10/16 120 lb 13 oz (54.8 kg)     Ideal Body Weight:  63 kg (adjusted for quadriplegia)  BMI:  Body mass index is 17.18 kg/m.  Estimated Nutritional Needs:   Kcal:  1750-2000  Protein:  90-105 grams  Fluid:  2 L  EDUCATION NEEDS:   No education needs identified at this time  KiMolli BarrowsRDChurch HillLDGlen RidgeCNGenevaager [phone] Hours Pager [phone]

## 2016-08-16 NOTE — Progress Notes (Addendum)
PROGRESS NOTE  Jason [name]  [id number] DOB: 01-03-1986 DOA: 08/09/2016 PCP: [name], MD Outpatient Specialists:  Subjective: Seen with his mother at bedside, awake and alert, more responsive today and answering questions but slightly confused. WBC continued to increase, decrease secondary to steroids. Blood sugar is uncontrolled, without acidosis, likely steroids contributing as well. PEG tube leaking, obtain x-ray.  Brief Narrative:  30 year old African-American male who was last hospitalized here about a week ago for sacral wound infection and who has a past medical history of C6 spinal cord injury, chronic trach and PEG and ostomy, diabetes mellitus type 1. Patient presented to the emergency department as his home care nurse was concerned about patient's wound. The patient's hemoglobin was noted to be low and he had a temperature of 100.7. He was hospitalized for further management. His stay was complicated by a fever of 102F and also by episodes of hypoglycemia. Subsequently, he became encephalopathic. Evaluation revealed several aspiration pneumonia. ID was reconsulted. Patient now on aztreonam.  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 -This is could be secondary to infectious process which thought to be aspiration pneumonia. -CT head without acute findings, medications that causes altered mental status is continued. -Improved, more  awake and alert today, answers questions is about the Lantus dosing.  Right lower lobe pneumonia, likely aspiration -RLL pneumonia per CXR, high risk for aspiration, has tracheostomy (he still eats regular diet) and quadriplegic -Had episode of hypoglycemia, which could prove predisposing to aspiration. -SLP to evaluate when his mentation improves.  Sinus tachycardia. Patient has had poor oral intake for the last 2 days. He is noted to have low-grade fever. These could be the reasons for his tachycardia. Heart rate has improved with IV fluid boluses. He'll be given more fluid today.   Sacral decubitus ulcer with chronic osteomyelitis. -Home care nurse was concerned about patient's wound.  -Seen by our wound care nurse. She knows the patient from his previous hospitalizations.  -She feels that the wound appears to be clean without any evidence for acute infection at this time.  -Patient has been on cefepime at home. Patient seen by infectious disease. Cefepime was discontinued on November 3.  History of diabetes mellitus type 1 with hypoglycemia -Hypoglycemic episode with blood sugar down to 13 on 11/4 at 3:33 AM.  -He was started on D10 with which his blood sugars stabilized. D10 was stopped 11/5. -Labile blood glucose, was on 12 units of Lantus will increase to 15 units, is still getting Solu-Medrol with antibiotics. -Mother at bedside asked for the Lantus to be changed to bedtime.  Fever Patient developed a temperature of 102F on the morning of 11/4. Hasn't had any recurrence of fever. Cultures are negative so far. Fever could've been due to aspiration.   Chronic hypotension. Continue with midodrine.   Anemia of chronic disease. Patient was transfused 2 units of PRBC. Hemoglobin has responded appropriately. Continue to monitor.   Chronic pain syndrome. Continue  home medications.  Functional quadriplegia. Stable. He has a trach. He has a PICC tube and an ostomy. He gets  feeding through the night according to the mother. However, he is able to tolerate orally as well. Nutrition is consulted. RT is following as well for trach.   DVT prophylaxis: Lovenox Code Status: Full Code Family Communication: Discussed with his mother at bedside. Disposition Plan:  Diet: Diet regular Room service appropriate? Yes; Fluid consistency: Thin  Consultants:   ID  Urology  Procedures:   None  Antimicrobials:   Aztreonam  Objective: Vitals:   08/16/16 0439 08/16/16 0745 08/16/16 0822 08/16/16 0823  BP:  119/85  119/85  Pulse:  92    Resp:  13    Temp:  99.1 F (37.3 C)    TempSrc:  Oral    SpO2:  99% 99%   Weight: 51.3 kg (113 lb)     Height:        Intake/Output Summary (Last 24 hours) at 08/16/16 1122 Last data filed at 08/16/16 1000  Gross per 24 hour  Intake          2636.67 ml  Output             1850 ml  Net           786.67 ml   Filed Weights   08/14/16 0448 08/15/16 0500 08/16/16 0439  Weight: 51.3 kg (113 lb) 52.6 kg (116 lb) 51.3 kg (113 lb)    Examination: General exam: Appears calm and comfortable  Respiratory system: Clear to auscultation. Respiratory effort normal. Cardiovascular system: S1 & S2 heard, RRR. No JVD, murmurs, rubs, gallops or clicks. No pedal edema. Gastrointestinal system: Abdomen is nondistended, soft and nontender. No organomegaly or masses felt. Normal bowel sounds heard. Central nervous system: Alert and oriented. No focal neurological deficits. Extremities: Symmetric 5 x 5 power. Skin: No rashes, lesions or ulcers Psychiatry: Judgement and insight appear normal. Mood & affect appropriate.   Data Reviewed: I have personally reviewed following labs and imaging studies  CBC:  Recent Labs Lab 08/09/16 2105  08/12/16 0500 08/13/16 0800 08/14/16 0447 08/15/16 0507 08/16/16 0500  WBC 24.2*  < > 22.9* 24.2* 28.2* 37.0* 33.5*  NEUTROABS 19.6*  --   --   --   --   --   --   HGB 7.0*  < > 9.4* 8.6* 9.1* 9.8*  9.8*  HCT 22.5*  < > 30.2* 27.6* 29.6* 31.2* 31.6*  MCV 84.0  < > 84.1 84.4 84.3 85.5 85.2  PLT 474*  < > 301 296 405* 453* 508*  < > = values in this interval not displayed. Basic Metabolic Panel:  Recent Labs Lab 08/13/16 0730 08/14/16 0447 08/15/16 0507 08/15/16 1520 08/16/16 0500  NA 129* 130* 132* 132* 135  K 4.7 5.1 5.4* 4.5 4.4  CL 100* 102 105 106 105  CO2 23 21* 21* 19* 23  GLUCOSE 254* 303* 418* 423* 322*  BUN 22* 25* 28* 32* 25*  CREATININE 0.73 0.88 0.72 0.77 0.64  CALCIUM 8.7* 9.0 8.7* 8.5* 8.8*   GFR: Estimated Creatinine Clearance: 96.2 mL/min (by C-G formula based on SCr of 0.64 mg/dL). Liver Function Tests:  Recent Labs Lab 08/09/16 2105 08/12/16 0500  AST 13* 13*  ALT 8* 9*  ALKPHOS 110 106  BILITOT 0.5 0.3  PROT 6.5 6.3*  ALBUMIN 1.8* 1.5*   No results for input(s): LIPASE, AMYLASE in the last 168 hours. No results for input(s): AMMONIA  in the last 168 hours. Coagulation Profile: No results for input(s): INR, PROTIME in the last 168 hours. Cardiac Enzymes: No results for input(s): CKTOTAL, CKMB, CKMBINDEX, TROPONINI in the last 168 hours. BNP (last 3 results) No results for input(s): PROBNP in the last 8760 hours. HbA1C: No results for input(s): HGBA1C in the last 72 hours. CBG:  Recent Labs Lab 08/15/16 1806 08/15/16 2123 08/16/16 0018 08/16/16 0407 08/16/16 0736  GLUCAP 319* 203* 236* 253* 200*   Lipid Profile: No results for input(s): CHOL, HDL, LDLCALC, TRIG, CHOLHDL, LDLDIRECT in the last 72 hours. Thyroid Function Tests: No results for input(s): TSH, T4TOTAL, FREET4, T3FREE, THYROIDAB in the last 72 hours. Anemia Panel: No results for input(s): VITAMINB12, FOLATE, FERRITIN, TIBC, IRON, RETICCTPCT in the last 72 hours. Urine analysis:    Component Value Date/Time   COLORURINE YELLOW 08/11/2016 1205   APPEARANCEUR CLOUDY (A) 08/11/2016 1205   LABSPEC 1.015 08/11/2016 1205   PHURINE 5.5 08/11/2016 1205   GLUCOSEU NEGATIVE  08/11/2016 1205   HGBUR MODERATE (A) 08/11/2016 1205   BILIRUBINUR NEGATIVE 08/11/2016 1205   BILIRUBINUR neg 06/28/2014 1445   KETONESUR NEGATIVE 08/11/2016 1205   PROTEINUR 100 (A) 08/11/2016 1205   UROBILINOGEN 0.2 07/27/2015 0645   NITRITE NEGATIVE 08/11/2016 1205   LEUKOCYTESUR SMALL (A) 08/11/2016 1205   Sepsis Labs: @LABRCNTIP (procalcitonin:4,lacticidven:4)  ) Recent Results (from the past 240 hour(s))  Urine culture     Status: Abnormal   Collection Time: 08/09/16  9:05 PM  Result Value Ref Range Status   Specimen Description URINE, RANDOM  Final   Special Requests NONE  Final   Culture MULTIPLE SPECIES PRESENT, SUGGEST RECOLLECTION (A)  Final   Report Status 08/11/2016 FINAL  Final  Blood Culture (routine x 2)     Status: None   Collection Time: 08/09/16 10:30 PM  Result Value Ref Range Status   Specimen Description BLOOD LEFT HAND  Final   Special Requests IN PEDIATRIC BOTTLE 1CC  Final   Culture NO GROWTH 5 DAYS  Final   Report Status 08/14/2016 FINAL  Final  Blood Culture (routine x 2)     Status: None   Collection Time: 08/09/16 10:45 PM  Result Value Ref Range Status   Specimen Description BLOOD RIGHT HAND  Final   Special Requests IN PEDIATRIC BOTTLE 2ML  Final   Culture NO GROWTH 5 DAYS  Final   Report Status 08/14/2016 FINAL  Final  Culture, blood (Routine X 2) w Reflex to ID Panel     Status: None (Preliminary result)   Collection Time: 08/11/16 11:40 AM  Result Value Ref Range Status   Specimen Description BLOOD LEFT ANTECUBITAL  Final   Special Requests IN PEDIATRIC BOTTLE 1CC  Final   Culture NO GROWTH 4 DAYS  Final   Report Status PENDING  Incomplete  Culture, blood (Routine X 2) w Reflex to ID Panel     Status: None (Preliminary result)   Collection Time: 08/11/16 11:45 AM  Result Value Ref Range Status   Specimen Description BLOOD BLOOD LEFT HAND  Final   Special Requests IN PEDIATRIC BOTTLE 3CC  Final   Culture NO GROWTH 4 DAYS  Final   Report  Status PENDING  Incomplete     Invalid input(s): PROCALCITONIN, LACTICACIDVEN   Radiology Studies: No results found.      Scheduled Meds: . albuterol  2.5 mg Nebulization BID  . aztreonam  1 g Intravenous Q8H   And  . famotidine  10 mg Oral  Q8H   And  . methylPREDNISolone (SOLU-MEDROL) injection  20 mg Intravenous Q8H  . baclofen  20 mg Oral QID  . buPROPion  150 mg Oral Daily  . diclofenac sodium  2 g Topical BID  . docusate  100 mg Oral Daily  . enoxaparin (LOVENOX) injection  40 mg Subcutaneous Q24H  . fentaNYL  75 mcg Transdermal Q72H  . ferrous sulfate  325 mg Oral Q breakfast  . free water  300-400 mL Per Tube Q8H  . insulin aspart  0-15 Units Subcutaneous Q4H  . insulin glargine  12 Units Subcutaneous Daily  . loratadine  10 mg Oral QHS  . magnesium oxide  400 mg Per Tube BID  . midodrine  20 mg Oral TID WC  . multivitamin with minerals  1 tablet Per Tube Daily  . mupirocin ointment   Topical BID  . oxybutynin  5 mg Per Tube BID  . pantoprazole  80 mg Oral Daily  . pregabalin  100 mg Oral TID WC  . pregabalin  200 mg Oral QHS  . sodium chloride flush  10-40 mL Intracatheter Q12H   Continuous Infusions: . feeding supplement (GLUCERNA 1.2 CAL) 1,000 mL (08/16/16 0600)     LOS: 5 days    Time spent: 35 minutes    Zanylah Hardie A, MD [facility] Pager [phone]  If 7PM-7AM, please contact night-coverage [email or web address] Password [email or web address] 08/16/2016, 11:22 AM

## 2016-08-16 NOTE — Progress Notes (Signed)
[facility]   Reason for visit: Follow up on fever  Interval History: continued improvement with mental status and near his baseline.  Remains afebrile now.  Day 4 aztreonam for ? Pneumonia.   Physical Exam: Constitutional:  Vitals:   08/16/16 0745 08/16/16 0823  BP: 119/85 119/85  Pulse: 92   Resp: 13   Temp: 99.1 F (37.3 C)   awake, appears comfortable; responds to questions Eyes: anicteric HENT: + trach Respiratory: CTA B, anterior exam Cardiovascular: tachy RR GI: soft, nt, nd  Review of Systems: Constitutional: no chills, eating  Lab Results  Component Value Date   WBC 33.5 (H) 08/16/2016   HGB 9.8 (L) 08/16/2016   HCT 31.6 (L) 08/16/2016   MCV 85.2 08/16/2016   PLT 508 (H) 08/16/2016    Lab Results  Component Value Date   CREATININE 0.64 08/16/2016   BUN 25 (H) 08/16/2016   NA 135 08/16/2016   K 4.4 08/16/2016   CL 105 08/16/2016   CO2 23 08/16/2016    Lab Results  Component Value Date   ALT 9 (L) 08/12/2016   AST 13 (L) 08/12/2016   ALKPHOS 106 08/12/2016     Microbiology: Recent Results (from the past 240 hour(s))  Urine culture     Status: Abnormal   Collection Time: 08/09/16  9:05 PM  Result Value Ref Range Status   Specimen Description URINE, RANDOM  Final   Special Requests NONE  Final   Culture MULTIPLE SPECIES PRESENT, SUGGEST RECOLLECTION (A)  Final   Report Status 08/11/2016 FINAL  Final  Blood Culture (routine x 2)     Status: None   Collection Time: 08/09/16 10:30 PM  Result Value Ref Range Status   Specimen Description BLOOD LEFT HAND  Final   Special Requests IN PEDIATRIC BOTTLE 1CC  Final   Culture NO GROWTH 5 DAYS  Final   Report Status 08/14/2016 FINAL  Final  Blood Culture (routine x 2)     Status: None   Collection Time: 08/09/16 10:45 PM  Result Value Ref Range Status   Specimen Description BLOOD RIGHT HAND  Final   Special Requests IN PEDIATRIC BOTTLE 2ML  Final   Culture NO GROWTH 5 DAYS   Final   Report Status 08/14/2016 FINAL  Final  Culture, blood (Routine X 2) w Reflex to ID Panel     Status: None (Preliminary result)   Collection Time: 08/11/16 11:40 AM  Result Value Ref Range Status   Specimen Description BLOOD LEFT ANTECUBITAL  Final   Special Requests IN PEDIATRIC BOTTLE 1CC  Final   Culture NO GROWTH 4 DAYS  Final   Report Status PENDING  Incomplete  Culture, blood (Routine X 2) w Reflex to ID Panel     Status: None (Preliminary result)   Collection Time: 08/11/16 11:45 AM  Result Value Ref Range Status   Specimen Description BLOOD BLOOD LEFT HAND  Final   Special Requests IN PEDIATRIC BOTTLE 3CC  Final   Culture NO GROWTH 4 DAYS  Final   Report Status PENDING  Incomplete    Impression/Plan:  1. AMS - ? If infection. On aztreonam empirically.  Will continue with a 10 day course, now day 4/10.  Has a picc line already.  2.  Fever - has remained afebrile now.  No hypoxia and less secretions.   3.  Leukocytosis - increased but in the setting of steroids and a high baseline WBC.  Slight decrease today.  Dr. Megan Salon will follow up tomorrow.

## 2016-08-16 NOTE — Progress Notes (Signed)
Pt transferred from 3s07 to 6E21 on tele after report.  Pt accompanied by mother.

## 2016-08-17 DIAGNOSIS — R410 Disorientation, unspecified: Secondary | ICD-10-CM

## 2016-08-17 DIAGNOSIS — Z880 Allergy status to penicillin: Secondary | ICD-10-CM

## 2016-08-17 LAB — BASIC METABOLIC PANEL
Anion gap: 6 (ref 5–15)
BUN: 20 mg/dL (ref 6–20)
CALCIUM: 8.7 mg/dL — AB (ref 8.9–10.3)
CHLORIDE: 107 mmol/L (ref 101–111)
CO2: 23 mmol/L (ref 22–32)
CREATININE: 0.52 mg/dL — AB (ref 0.61–1.24)
GFR calc non Af Amer: >60 mL/min (ref >60)
GLUCOSE: 139 mg/dL — AB (ref 65–99)
Potassium: 4.4 mmol/L (ref 3.5–5.1)
Sodium: 136 mmol/L (ref 135–145)

## 2016-08-17 LAB — GLUCOSE, CAPILLARY
GLUCOSE-CAPILLARY: 102 mg/dL — AB (ref 65–99)
GLUCOSE-CAPILLARY: 130 mg/dL — AB (ref 65–99)
GLUCOSE-CAPILLARY: 202 mg/dL — AB (ref 65–99)
Glucose-Capillary: 157 mg/dL — ABNORMAL HIGH (ref 65–99)
Glucose-Capillary: 144 mg/dL — ABNORMAL HIGH (ref 65–99)
Glucose-Capillary: 104 mg/dL — ABNORMAL HIGH (ref 65–99)
Glucose-Capillary: 113 mg/dL — ABNORMAL HIGH (ref 65–99)

## 2016-08-17 LAB — CBC
HCT: 28.2 % — ABNORMAL LOW (ref 39.0–52.0)
Hemoglobin: 8.8 g/dL — ABNORMAL LOW (ref 13.0–17.0)
MCH: 26.6 pg (ref 26.0–34.0)
MCHC: 31.2 g/dL (ref 30.0–36.0)
MCV: 85.2 fL (ref 78.0–100.0)
PLATELETS: 466 10*3/uL — AB (ref 150–400)
RBC: 3.31 MIL/uL — AB (ref 4.22–5.81)
RDW: 17 % — ABNORMAL HIGH (ref 11.5–15.5)
WBC: 24.6 10*3/uL — ABNORMAL HIGH (ref 4.0–10.5)

## 2016-08-17 MED ORDER — ALBUTEROL SULFATE (2.5 MG/3ML) 0.083% IN NEBU
2.5000 mg | INHALATION_SOLUTION | RESPIRATORY_TRACT | Status: DC | PRN
Start: 2016-08-17 — End: 2016-08-18

## 2016-08-17 NOTE — Progress Notes (Signed)
PROGRESS NOTE  Jason [name]  [id number] DOB: 23-Mar-1986 DOA: 08/09/2016 PCP: [name], MD Outpatient Specialists:  Subjective: Seen with his mother at bedside, confusion resolved, patient is awake, alert and oriented 3. He is asking when he can go home, blood sugar appears to be controlled. ID recommendations noted for 5 more days of aztreonam.  Brief Narrative:  30 year old African-American male who was last hospitalized here about a week ago for sacral wound infection and who has a past medical history of C6 spinal cord injury, chronic trach and PEG and ostomy, diabetes mellitus type 1. Patient presented to the emergency department as his home care nurse was concerned about patient's wound. The patient's hemoglobin was noted to be low and he had a temperature of 100.7. He was hospitalized for further management. His stay was complicated by a fever of 102F and also by episodes of hypoglycemia. Subsequently, he became encephalopathic. Evaluation revealed several aspiration pneumonia. ID was reconsulted. Patient now on aztreonam.  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 -CT head without acute findings, medications that causes altered mental status is continued. -Improved, this is resolved.  Right lower lobe pneumonia, likely aspiration -RLL pneumonia per CXR, high risk for aspiration, has tracheostomy (he still eats regular diet) and quadriplegic -Had episode of hypoglycemia, which could predispose to aspiration. -ID recommended aztreonam for 5 more days, SPECT  discharge him.  Sinus tachycardia. -Patient has had poor oral intake for the last 2 days. He is noted to have low-grade fever.  -Mild sinus tachycardia, likely appropriate sinus tach because of lumbar infection.  Sacral decubitus ulcer with chronic osteomyelitis. -Home care nurse was concerned about patient's wound.  -Seen by our wound care nurse. She knows the patient from his previous hospitalizations.  -She feels that the wound appears to be clean without any evidence for acute infection at this time.  -Patient has been on cefepime at home. Patient seen by infectious disease. Cefepime was discontinued on November 3.  History of diabetes mellitus type 1 with hypoglycemia -Hypoglycemic episode with blood sugar down to 13 on 11/4 at 3:33 AM.  -He was started on D10 with which his blood sugars stabilized. D10 was stopped 11/5. -Labile blood glucose, was on 12 units of Lantus will increase to 15 units, is still getting Solu-Medrol with antibiotics. -Mother at bedside asked for the Lantus to be changed to bedtime.  Fever Patient developed a temperature of 102F on the morning of 11/4. Hasn't had any recurrence of fever. Cultures are negative so far. Fever could've been due to aspiration.   Chronic hypotension. Continue with midodrine.   Anemia of chronic disease. Patient was transfused 2 units of PRBC. Hemoglobin has responded appropriately. Continue to monitor.   Chronic pain syndrome. Continue home medications.  Functional quadriplegia. Stable. He has a trach. He has a PICC tube and an ostomy. He gets feeding through the night according to the mother. However, he is able to tolerate orally as well. Nutrition is consulted. RT is following as well for trach.   DVT prophylaxis: Lovenox Code Status: Full Code Family Communication: Discussed  with his mother at bedside. Disposition Plan: Discharge home in a.m. with aztreonam for 5 more days. Diet: Diet regular Room service  appropriate? Yes; Fluid consistency: Thin  Consultants:   ID  Urology  Procedures:   None  Antimicrobials:   Aztreonam  Objective: Vitals:   08/17/16 0302 08/17/16 0431 08/17/16 0821 08/17/16 0824  BP:  128/78  121/63  Pulse: (!) 112 (!) 103  97  Resp: 17 18  18   Temp:  100.3 F (37.9 C)  99.4 F (37.4 C)  TempSrc:  Oral  Oral  SpO2:  100% 94% 100%  Weight:      Height:        Intake/Output Summary (Last 24 hours) at 08/17/16 1145 Last data filed at 08/17/16 1141  Gross per 24 hour  Intake          2172.17 ml  Output             2650 ml  Net          -477.83 ml   Filed Weights   08/15/16 0500 08/16/16 0439 08/16/16 2119  Weight: 52.6 kg (116 lb) 51.3 kg (113 lb) 51.2 kg (112 lb 14 oz)    Examination: General exam: Appears calm and comfortable  Respiratory system: Clear to auscultation. Respiratory effort normal. Cardiovascular system: S1 & S2 heard, RRR. No JVD, murmurs, rubs, gallops or clicks. No pedal edema. Gastrointestinal system: Abdomen is nondistended, soft and nontender. No organomegaly or masses felt. Normal bowel sounds heard. Central nervous system: Alert and oriented. No focal neurological deficits. Extremities: Symmetric 5 x 5 power. Skin: No rashes, lesions or ulcers Psychiatry: Judgement and insight appear normal. Mood & affect appropriate.   Data Reviewed: I have personally reviewed following labs and imaging studies  CBC:  Recent Labs Lab 08/13/16 0800 08/14/16 0447 08/15/16 0507 08/16/16 0500 08/17/16 0500  WBC 24.2* 28.2* 37.0* 33.5* 24.6*  HGB 8.6* 9.1* 9.8* 9.8* 8.8*  HCT 27.6* 29.6* 31.2* 31.6* 28.2*  MCV 84.4 84.3 85.5 85.2 85.2  PLT 296 405* 453* 508* [id number]*   Basic Metabolic Panel:  Recent Labs Lab 08/14/16 0447 08/15/16 0507 08/15/16 1520 08/16/16 0500 08/17/16 0500  NA 130* 132* 132* 135 136  K 5.1 5.4* 4.5 4.4 4.4  CL 102 105 106 105 107  CO2 21* 21* 19* 23 23  GLUCOSE 303* 418* 423* 322* 139*  BUN 25* 28*  32* 25* 20  CREATININE 0.88 0.72 0.77 0.64 0.52*  CALCIUM 9.0 8.7* 8.5* 8.8* 8.7*   GFR: Estimated Creatinine Clearance: 96 mL/min (by C-G formula based on SCr of 0.52 mg/dL (L)). Liver Function Tests:  Recent Labs Lab 08/12/16 0500  AST 13*  ALT 9*  ALKPHOS 106  BILITOT 0.3  PROT 6.3*  ALBUMIN 1.5*   No results for input(s): LIPASE, AMYLASE in the last 168 hours. No results for input(s): AMMONIA in the last 168 hours. Coagulation Profile: No results for input(s): INR, PROTIME in the last 168 hours. Cardiac Enzymes: No results for input(s): CKTOTAL, CKMB, CKMBINDEX, TROPONINI in the last 168 hours. BNP (last 3 results) No results for input(s): PROBNP in the last 8760 hours. HbA1C: No results for input(s): HGBA1C in the last 72 hours. CBG:  Recent Labs Lab 08/16/16 2005 08/16/16 2111 08/17/16 0024 08/17/16 0406 08/17/16 0802  GLUCAP 101* 132* 130* 157* 144*   Lipid Profile: No results for input(s): CHOL, HDL, LDLCALC, TRIG, CHOLHDL, LDLDIRECT in the last 72 hours. Thyroid Function Tests: No results for input(s):  TSH, T4TOTAL, FREET4, T3FREE, THYROIDAB in the last 72 hours. Anemia Panel: No results for input(s): VITAMINB12, FOLATE, FERRITIN, TIBC, IRON, RETICCTPCT in the last 72 hours. Urine analysis:    Component Value Date/Time   COLORURINE YELLOW 08/11/2016 1205   APPEARANCEUR CLOUDY (A) 08/11/2016 1205   LABSPEC 1.015 08/11/2016 1205   PHURINE 5.5 08/11/2016 1205   GLUCOSEU NEGATIVE 08/11/2016 1205   HGBUR MODERATE (A) 08/11/2016 1205   BILIRUBINUR NEGATIVE 08/11/2016 1205   BILIRUBINUR neg 06/28/2014 1445   KETONESUR NEGATIVE 08/11/2016 1205   PROTEINUR 100 (A) 08/11/2016 1205   UROBILINOGEN 0.2 07/27/2015 0645   NITRITE NEGATIVE 08/11/2016 1205   LEUKOCYTESUR SMALL (A) 08/11/2016 1205   Sepsis Labs: @LABRCNTIP (procalcitonin:4,lacticidven:4)  ) Recent Results (from the past 240 hour(s))  Urine culture     Status: Abnormal   Collection Time:  08/09/16  9:05 PM  Result Value Ref Range Status   Specimen Description URINE, RANDOM  Final   Special Requests NONE  Final   Culture MULTIPLE SPECIES PRESENT, SUGGEST RECOLLECTION (A)  Final   Report Status 08/11/2016 FINAL  Final  Blood Culture (routine x 2)     Status: None   Collection Time: 08/09/16 10:30 PM  Result Value Ref Range Status   Specimen Description BLOOD LEFT HAND  Final   Special Requests IN PEDIATRIC BOTTLE 1CC  Final   Culture NO GROWTH 5 DAYS  Final   Report Status 08/14/2016 FINAL  Final  Blood Culture (routine x 2)     Status: None   Collection Time: 08/09/16 10:45 PM  Result Value Ref Range Status   Specimen Description BLOOD RIGHT HAND  Final   Special Requests IN PEDIATRIC BOTTLE 2ML  Final   Culture NO GROWTH 5 DAYS  Final   Report Status 08/14/2016 FINAL  Final  Culture, blood (Routine X 2) w Reflex to ID Panel     Status: None   Collection Time: 08/11/16 11:40 AM  Result Value Ref Range Status   Specimen Description BLOOD LEFT ANTECUBITAL  Final   Special Requests IN PEDIATRIC BOTTLE 1CC  Final   Culture NO GROWTH 5 DAYS  Final   Report Status 08/16/2016 FINAL  Final  Culture, blood (Routine X 2) w Reflex to ID Panel     Status: None   Collection Time: 08/11/16 11:45 AM  Result Value Ref Range Status   Specimen Description BLOOD BLOOD LEFT HAND  Final   Special Requests IN PEDIATRIC BOTTLE 3CC  Final   Culture NO GROWTH 5 DAYS  Final   Report Status 08/16/2016 FINAL  Final     Invalid input(s): PROCALCITONIN, LACTICACIDVEN   Radiology Studies: Dg Abdomen Peg Tube Location  Result Date: 08/16/2016 CLINICAL DATA:  Peg tube leaking. EXAM: ABDOMEN - 1 VIEW COMPARISON:  Chest CT - 08/13/2016. FINDINGS: Contrast has been injected via the gastrostomy tube with opacification of the stomach and proximal duodenum. Paucity of bowel gas without evidence of enteric obstruction. No supine evidence of pneumoperitoneum. No pneumatosis or portal venous gas.  Limited visualization of the lower thorax demonstrates right basilar heterogeneous opacities with suspected air bronchograms compatible with the findings seen on preceding chest CT. No definite acute or aggressive osseous abnormalities. IMPRESSION: 1. Contrast injection of gastrostomy tube demonstrates opacification of the stomach and proximal small bowel. No evidence of enteric obstruction. 2. Findings worrisome for right lower lung pneumonia, similar to the 08/13/2016 examination. Electronically Signed   By: Sandi Mariscal M.D.   On: 08/16/2016 13:14  Scheduled Meds: . albuterol  2.5 mg Nebulization BID  . aztreonam  1 g Intravenous Q8H   And  . famotidine  10 mg Oral Q8H   And  . methylPREDNISolone (SOLU-MEDROL) injection  20 mg Intravenous Q8H  . baclofen  20 mg Oral QID  . buPROPion  150 mg Oral Daily  . diclofenac sodium  2 g Topical BID  . docusate  100 mg Oral Daily  . enoxaparin (LOVENOX) injection  40 mg Subcutaneous Q24H  . fentaNYL  75 mcg Transdermal Q72H  . ferrous sulfate  325 mg Oral Q breakfast  . free water  300-400 mL Per Tube Q8H  . insulin aspart  0-15 Units Subcutaneous Q4H  . insulin glargine  12 Units Subcutaneous Daily  . loratadine  10 mg Oral QHS  . magnesium oxide  400 mg Per Tube BID  . midodrine  20 mg Oral TID WC  . multivitamin with minerals  1 tablet Per Tube Daily  . mupirocin ointment   Topical BID  . oxybutynin  5 mg Per Tube BID  . pantoprazole  80 mg Oral Daily  . pregabalin  100 mg Oral TID WC  . pregabalin  200 mg Oral QHS  . sodium chloride flush  10-40 mL Intracatheter Q12H   Continuous Infusions: . feeding supplement (GLUCERNA 1.2 CAL) Stopped (08/17/16 1143)     LOS: 6 days    Time spent: 35 minutes    Tryston Gilliam A, MD [facility] Pager [phone]  If 7PM-7AM, please contact night-coverage [email or web address] Password [email or web address] 08/17/2016, 11:45 AM

## 2016-08-17 NOTE — Progress Notes (Signed)
Pharmacy Antibiotic Note  Jason [name] is a 30 y.o. male admitted on 08/09/2016 with pneumonia.  Pharmacy has been consulted for aztreonam dosing.   Plan: -Aztreonam 1g IV q8h- planned through 11/15 per ID recommendations. Remains on Solumedrol 20mg  IV q8h + Pepcid 10mg  q8h as pre-meds (linked orders) -Follow renal function and any s/s of allergic reaction -Patient on Cortef 20mg  qAM and 10mg  qPM- this needs to be restarted after completion of Solumedrol   Height: 5\' 8"  (172.7 cm) Weight: 112 lb 14 oz (51.2 kg) IBW/kg (Calculated) : 68.4  Temp (24hrs), Avg:99 F (37.2 C), Min:98.1 F (36.7 C), Max:100.3 F (37.9 C)   Recent Labs Lab 08/13/16 0800 08/14/16 0447 08/15/16 0507 08/15/16 1520 08/16/16 0500 08/17/16 0500  WBC 24.2* 28.2* 37.0*  --  33.5* 24.6*  CREATININE  --  0.88 0.72 0.77 0.64 0.52*    Estimated Creatinine Clearance: 96 mL/min (by C-G formula based on SCr of 0.52 mg/dL (L)).    Allergies  Allergen Reactions  . Amikacin Sulfate Other (See Comments)    Seizure   . Cefuroxime Axetil Anaphylaxis    Tolerated cefepime 12/2015 - with Pepcid/Solu-Medrol  . Ertapenem Other (See Comments)    Rash and confusion-->tolerated Imipenem   . Morphine And Related Other (See Comments)    Changed mental status, confusion, headache, visual hallucination  . Nsaids Itching and Other (See Comments)    Risk of bleeding, itching  . Penicillins Anaphylaxis and Other (See Comments)    Tolerated Imipenem; no reaction to 7 day course of amoxicillin in 2015 Has patient had a PCN reaction causing immediate rash, facial/tongue/throat swelling, SOB or lightheadedness with hypotension: Yes Has patient had a PCN reaction causing severe rash involving mucus membranes or skin necrosis: No Has patient had a PCN reaction that required hospitalization Yes Has patient had a PCN reaction occurring within the last 10 years: No If all of the above answers are "NO", then may proceed with  Cephalo  . Sulfa Antibiotics Anaphylaxis, Shortness Of Breath and Other (See Comments)  . Tessalon [Benzonatate] Anaphylaxis  . Levaquin [Levofloxacin In D5w] Swelling    Hand and forearm swelling  . Shellfish Allergy Itching and Other (See Comments)    Took benadryl to alleviate reaction  . Miripirium Rash and Other (See Comments)    Change in mental status    Antimicrobials this admission: 10/19 (PTA) Cefepime >> 11/3 Aztreonam 11/6>>(11/15)  Microbiology results: 11/2 BCx x2: neg 11/4 BC x 2: neg 11/2 UCx: neg 10/17: Group B Strep (pan-S except R-eryth) + Proteus (pan-S)  Thank you for allowing pharmacy to be a part of this patient's care.  Kiersten Coss D. Taje Tondreau, PharmD, BCPS Clinical Pharmacist Pager: [phone] 08/17/2016 2:14 PM

## 2016-08-17 NOTE — Progress Notes (Signed)
Request made for bedside continuous pulse ox. Secretary was ordering. RT will continue to monitor.

## 2016-08-17 NOTE — Progress Notes (Signed)
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 albuterol  2.5 mg Nebulization BID  . aztreonam  1 g Intravenous Q8H   And  . famotidine  10 mg Oral Q8H   And  . methylPREDNISolone (SOLU-MEDROL) injection  20 mg Intravenous Q8H  . baclofen  20 mg Oral QID  . buPROPion  150 mg Oral Daily  . diclofenac sodium  2 g Topical BID  . docusate  100 mg Oral Daily  . enoxaparin (LOVENOX) injection  40 mg Subcutaneous Q24H  . fentaNYL  75 mcg Transdermal Q72H  . ferrous sulfate  325 mg Oral Q breakfast  . free water  300-400 mL Per Tube Q8H  . insulin aspart  0-15 Units Subcutaneous Q4H  . insulin glargine  12 Units Subcutaneous Daily  . loratadine  10 mg Oral QHS  . magnesium oxide  400 mg Per Tube BID  . midodrine  20 mg Oral TID WC  . multivitamin with minerals  1 tablet Per Tube Daily  . mupirocin ointment   Topical BID  . oxybutynin  5 mg Per Tube BID  . pantoprazole  80 mg Oral Daily  . pregabalin  100 mg Oral TID WC  . pregabalin  200 mg Oral QHS  . sodium chloride flush  10-40 mL Intracatheter Q12H    SUBJECTIVE: Jason [name] has feeling much better today. His mother states that he is back to his normal baseline. It is productive cough is improving.  Review of Systems: Review of Systems  Constitutional: Negative for chills, diaphoresis and fever.  Respiratory: Positive for cough and sputum production.     Past Medical History:  Diagnosis Date  . Anemia   . Anxiety   . Arthritis   . Asthma   . 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 Colostomy in place [facility])   . Complication of anesthesia    difficulty waking up, will have  sudden drop in BP and O2 sats  . Depression   . Diabetic neuropathy ([place])   . Diabetic retinopathy ([place])   . Dysrhythmia    usually due to medications  . Family history of anesthesia complication    Pt mother can't have epidural procedures  . Fibromyalgia   . Gastroparesis   . GERD (gastroesophageal reflux disease)   . H/O constipation   . H/O respiratory failure   . H/O thrombosis   . Hx MRSA infection    on face  . Multiple allergies 11/02/2015  . Pneumonia   . Recurrent UTI 11/02/2015  . Seizures ([place]) 2017   one time during admission in hosptital  . 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 Syncope 02/16/2015  . 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 Type I diabetes mellitus ([place])    sees Dr. Loanne Drilling     Social History  Substance Use Topics  . Smoking status: Former Smoker    Packs/day: 1.00    Years: 12.00    Types: Cigars, Cigarettes    Quit date: 09/07/2014  . Smokeless tobacco: Never Used  .  Alcohol use No    Family History  Problem Relation Age of Onset  . Diabetes Father   . Hypertension Father   . Heart disease Mother     congenital - MVP  . Asthma      fhx  . Hypertension      fhx  . Stroke      fhx   Allergies  Allergen Reactions  . Amikacin Sulfate Other (See Comments)    Seizure   . Cefuroxime Axetil Anaphylaxis    Tolerated cefepime 12/2015 - with Pepcid/Solu-Medrol  . Ertapenem Other (See Comments)    Rash and confusion-->tolerated Imipenem   . Morphine And Related Other (See Comments)    Changed mental status, confusion, headache, visual hallucination  . Nsaids Itching and Other (See Comments)    Risk of bleeding, itching  . Penicillins Anaphylaxis and Other (See Comments)    Tolerated Imipenem; no reaction to 7 day course of amoxicillin in 2015 Has patient had a PCN reaction causing immediate rash, facial/tongue/throat swelling, SOB or lightheadedness with hypotension: Yes Has patient had a PCN reaction causing severe rash  involving mucus membranes or skin necrosis: No Has patient had a PCN reaction that required hospitalization Yes Has patient had a PCN reaction occurring within the last 10 years: No If all of the above answers are "NO", then may proceed with Cephalo  . Sulfa Antibiotics Anaphylaxis, Shortness Of Breath and Other (See Comments)  . Tessalon [Benzonatate] Anaphylaxis  . Levaquin [Levofloxacin In D5w] Swelling    Hand and forearm swelling  . Shellfish Allergy Itching and Other (See Comments)    Took benadryl to alleviate reaction  . Miripirium Rash and Other (See Comments)    Change in mental status    OBJECTIVE: Vitals:   08/17/16 0302 08/17/16 0431 08/17/16 0821 08/17/16 0824  BP:  128/78  121/63  Pulse: (!) 112 (!) 103  97  Resp: 17 18  18   Temp:  100.3 F (37.9 C)  99.4 F (37.4 C)  TempSrc:  Oral  Oral  SpO2:  100% 94% 100%  Weight:      Height:       Body mass index is 17.16 kg/m.  Physical Exam  Constitutional: He is oriented to person, place, and time.  He is alert, sitting up in bed with his headphones on.  Cardiovascular: Regular rhythm.   No murmur heard. Tachycardic.  Pulmonary/Chest: Effort normal and breath sounds normal. He has no wheezes. He has no rales.  Neurological: He is alert and oriented to person, place, and time.  Skin: No rash noted.  Psychiatric: Mood and affect normal.    Lab Results Lab Results  Component Value Date   WBC 24.6 (H) 08/17/2016   HGB 8.8 (L) 08/17/2016   HCT 28.2 (L) 08/17/2016   MCV 85.2 08/17/2016   PLT 466 (H) 08/17/2016    Lab Results  Component Value Date   CREATININE 0.52 (L) 08/17/2016   BUN 20 08/17/2016   NA 136 08/17/2016   K 4.4 08/17/2016   CL 107 08/17/2016   CO2 23 08/17/2016    Lab Results  Component Value Date   ALT 9 (L) 08/12/2016   AST 13 (L) 08/12/2016   ALKPHOS 106 08/12/2016   BILITOT 0.3 08/12/2016     Microbiology: Recent Results (from the past 240 hour(s))  Urine culture     Status:  Abnormal   Collection Time: 08/09/16  9:05 PM  Result Value Ref Range Status  Specimen Description URINE, RANDOM  Final   Special Requests NONE  Final   Culture MULTIPLE SPECIES PRESENT, SUGGEST RECOLLECTION (A)  Final   Report Status 08/11/2016 FINAL  Final  Blood Culture (routine x 2)     Status: None   Collection Time: 08/09/16 10:30 PM  Result Value Ref Range Status   Specimen Description BLOOD LEFT HAND  Final   Special Requests IN PEDIATRIC BOTTLE [place]  Final   Culture NO GROWTH 5 DAYS  Final   Report Status 08/14/2016 FINAL  Final  Blood Culture (routine x 2)     Status: None   Collection Time: 08/09/16 10:45 PM  Result Value Ref Range Status   Specimen Description BLOOD RIGHT HAND  Final   Special Requests IN PEDIATRIC BOTTLE 2ML  Final   Culture NO GROWTH 5 DAYS  Final   Report Status 08/14/2016 FINAL  Final  Culture, blood (Routine X 2) w Reflex to ID Panel     Status: None   Collection Time: 08/11/16 11:40 AM  Result Value Ref Range Status   Specimen Description BLOOD LEFT ANTECUBITAL  Final   Special Requests IN PEDIATRIC BOTTLE 1CC  Final   Culture NO GROWTH 5 DAYS  Final   Report Status 08/16/2016 FINAL  Final  Culture, blood (Routine X 2) w Reflex to ID Panel     Status: None   Collection Time: 08/11/16 11:45 AM  Result Value Ref Range Status   Specimen Description BLOOD BLOOD LEFT HAND  Final   Special Requests IN PEDIATRIC BOTTLE 3CC  Final   Culture NO GROWTH 5 DAYS  Final   Report Status 08/16/2016 FINAL  Final     ASSESSMENT: He has improved on empiric aztreonam. I would continue it for 5 more days.  PLAN: 1. Continue aztreonam for 5 more days 2. Please call me for any infectious disease questions this weekend  Michel Bickers, MD [facility] for [place] [phone] pager   [phone] cell 08/17/2016, 11:18 AM

## 2016-08-17 NOTE — Care Management Note (Addendum)
Case Management Note  Patient Details  Name: Jason [name] MRN: [id number] Date of Birth: Sep 21, 1986  Subjective/Objective:          CM following for progression and d/c planning.          Action/Plan: 08/17/2016 Noted CM consult for home IV antibiotics, pt is active with [facility] for home meds and [facility] for nursing services. Per Alvis Lemmings pt receives 18 hr per day of nursing services and they will resume preparing for pt d/c tomorrow am 08/18/2016. [facility] will provide IV Azactum preparing for d/c on 08/18/2016. Prescription requested of MD so that [facility] can begin to prepare medication.  Expected Discharge Date:  08/17/16               Expected Discharge Plan:  [place]  In-House Referral:  NA  Discharge planning Services  CM Consult, Medication Assistance  Post Acute Care Choice:  Resumption of Svcs/PTA Provider, Home Health Choice offered to:  Parent  DME Arranged:  IV pump/equipment DME Agency:  [place] Arranged:  RN, Nurse's Aide [place] Agency:  [place]  Status of Service:  Completed, signed off  If discussed at [place] of Stay Meetings, dates discussed:    Additional Comments:  Adron Bene, RN 08/17/2016, 12:20 PM

## 2016-08-18 DIAGNOSIS — L89314 Pressure ulcer of right buttock, stage 4: Secondary | ICD-10-CM

## 2016-08-18 DIAGNOSIS — G9340 Encephalopathy, unspecified: Secondary | ICD-10-CM

## 2016-08-18 LAB — BASIC METABOLIC PANEL
ANION GAP: 7 (ref 5–15)
BUN: 19 mg/dL (ref 6–20)
CO2: 23 mmol/L (ref 22–32)
Calcium: 8.7 mg/dL — ABNORMAL LOW (ref 8.9–10.3)
Chloride: 103 mmol/L (ref 101–111)
Creatinine, Ser: 0.56 mg/dL — ABNORMAL LOW (ref 0.61–1.24)
GFR calc Af Amer: >60 mL/min (ref >60)
GLUCOSE: 182 mg/dL — AB (ref 65–99)
POTASSIUM: 5 mmol/L (ref 3.5–5.1)
Sodium: 133 mmol/L — ABNORMAL LOW (ref 135–145)

## 2016-08-18 LAB — CBC
HEMATOCRIT: 30.5 % — AB (ref 39.0–52.0)
HEMOGLOBIN: 9.4 g/dL — AB (ref 13.0–17.0)
MCH: 26.2 pg (ref 26.0–34.0)
MCHC: 30.8 g/dL (ref 30.0–36.0)
MCV: 85 fL (ref 78.0–100.0)
Platelets: 545 10*3/uL — ABNORMAL HIGH (ref 150–400)
RBC: 3.59 MIL/uL — AB (ref 4.22–5.81)
RDW: 17.1 % — ABNORMAL HIGH (ref 11.5–15.5)
WBC: 21.3 10*3/uL — ABNORMAL HIGH (ref 4.0–10.5)

## 2016-08-18 LAB — GLUCOSE, CAPILLARY
GLUCOSE-CAPILLARY: 110 mg/dL — AB (ref 65–99)
GLUCOSE-CAPILLARY: 147 mg/dL — AB (ref 65–99)
GLUCOSE-CAPILLARY: 176 mg/dL — AB (ref 65–99)
Glucose-Capillary: 184 mg/dL — ABNORMAL HIGH (ref 65–99)

## 2016-08-18 MED ORDER — METHYLPREDNISOLONE SODIUM SUCC 40 MG IJ SOLR: 20.0000 mg | Freq: Three times a day (TID) | INTRAMUSCULAR | 0 refills | Status: AC

## 2016-08-18 MED ORDER — FAMOTIDINE 10 MG PO TABS: 10.0000 mg | ORAL_TABLET | Freq: Three times a day (TID) | ORAL | 0 refills | Status: AC

## 2016-08-18 MED ORDER — HYDROCORTISONE 20 MG PO TABS: 20.0000 mg | ORAL_TABLET | Freq: Every morning | ORAL | Status: AC

## 2016-08-18 MED ORDER — HEPARIN SOD (PORK) LOCK FLUSH 100 UNIT/ML IV SOLN
250.0000 [IU] | INTRAVENOUS | Status: AC | PRN
  Administered 2016-08-18: 250 [IU]

## 2016-08-18 MED ORDER — DEXTROSE 5 % IV SOLN: 1.0000 g | Freq: Three times a day (TID) | INTRAVENOUS | 15 refills | Status: AC

## 2016-08-18 MED ORDER — HYDROCORTISONE 10 MG PO TABS: 10.0000 mg | ORAL_TABLET | Freq: Every evening | ORAL | Status: AC

## 2016-08-18 NOTE — Discharge Summary (Signed)
Physician Discharge Summary  Htoo Kaniecki Fincastle [id number] DOB: [birth year] DOA: 08/09/2016  PCP: [name], MD  Admit date: 08/09/2016 Discharge date: 08/18/2016  Admitted From: Home Disposition: Home  Recommendations for Outpatient Follow-up:  1. Follow up with PCP in 1-2 weeks 2. Please obtain BMP/CBC in one week 3. Continue aztreonam IV 1 g every 8 hours for 5 more days along with Solu-Medrol 20 mg and famotidine 10 mg.  Home Health: NA Equipment/Devices:NA  Discharge Condition: Stable CODE STATUS: Full Code Diet recommendation: Diet regular Room service appropriate? Yes; Fluid consistency: Thin Diet - low sodium heart healthy  Brief/Interim Summary: 30 year old unfortunate African-American male with quadriplegia, who was last hospitalized here about a week ago for sacral wound infection and who has a past medical history of C6 spinal cord injury, chronic trach and PEG and ostomy, diabetes mellitus type 1. Patient presented to the emergency department as his home care nurse was concerned about patient's wound. The patient's hemoglobin was noted to be low and he had a temperature of 100.7. He was hospitalized for further management. His stay was complicated by a fever of 102F and also by episodes of hypoglycemia. Subsequently, he became encephalopathic. Evaluation revealed several aspiration pneumonia. ID was reconsulted. Patient now on aztreonam.  Discharge Diagnoses:  Principal Problem:   Aspiration pneumonia of right lower lobe ([name]) Active Problems:   Decubitus ulcer of right perineal ischial region, stage 4 ([name])   Decubitus ulcer of left perineal ischial region, stage 4 ([name])   Sacral decubitus ulcer, stage IV ([name])   Anemia of chronic disease   Acute Encephalopathy -Patient was somnolent, currently wide awake and alert , confusion resolved -This is could be secondary to infectious process which thought to be aspiration pneumonia. -CT head without acute findings,  medications that causes altered mental status is continued. -Improved, this is resolved.  Right lower lobe pneumonia, likely aspiration -RLL pneumonia per CXR, high risk for aspiration, has tracheostomy (he still eats regular diet) and quadriplegic -Had episode of hypoglycemia and decreased level of consciousness, which could predispose to aspiration. -ID recommended aztreonam for 5 more days. -On the night prior to the discharge patient developed fever of 101.3, discussed with his mother to keep one more day. -His mother reported he gets that at home all the time, she is comfortable taking him home antibiotics.  Sinus tachycardia. -Patient has had poor oral intake for the last 2 days. He is noted to have low-grade fever.  -Mild sinus tachycardia, likely appropriate sinus tach because of lumbar infection.  Sacral decubitus ulcer with chronic osteomyelitis. -Home care nurse was concerned about patient's wound.  -Seen by our wound care nurse. She knows the patient from his previous hospitalizations.  -She feels that the wound appears to be clean without any evidence for acute infection at this time.  -Patient has been on cefepime at home. Patient seen by infectious disease. Cefepime was discontinued on 11/ 3.  History of diabetes mellitus type 1 with hypoglycemia -Hypoglycemic episode with blood sugar down to 13 on 11/4 at 3:33 AM.  -He was started on D10 with which his blood sugars stabilized. D10 was stopped 11/5. -Labile blood glucose, was on 12 units of Lantus. -Started back on his home dose of Lantus discharge.  Fever Patient developed a temperature of 102F on the morning of 11/4. Hasn't had any recurrence of fever. Cultures are negative so far. Fever could've been due to aspiration.   Chronic hypotension. Continue with midodrine.   Anemia of chronic  disease. Patient was transfused 2 units of PRBC. Hemoglobin has responded appropriately. Continue to monitor.   Chronic  pain syndrome. Continue home medications.  Functional quadriplegia. Stable. He has a trach. He has a PICC tube and an ostomy. He gets feeding through the nightaccording to the mother. However, he is able to tolerate orally as well. Nutrition is consulted. RT is following as well for trach.   Discharge Instructions  Discharge Instructions    Diet - low sodium heart healthy    Complete by:  As directed    Increase activity slowly    Complete by:  As directed        Medication List    STOP taking these medications   ceFEPIme 2 g in dextrose 5 % 50 mL     TAKE these medications   acetaminophen 325 MG tablet Commonly known as:  TYLENOL Take 2 tablets (650 mg total) by mouth every 6 (six) hours as needed for mild pain, moderate pain, fever or headache.   albuterol (2.5 MG/3ML) 0.083% nebulizer solution Commonly known as:  PROVENTIL Take 3 mLs (2.5 mg total) by nebulization 4 (four) times daily.   aztreonam 1 g in dextrose 5 % 50 mL Inject 1 g into the vein every 8 (eight) hours.   baclofen 20 MG tablet Commonly known as:  LIORESAL Take 1 tablet (20 mg total) by mouth 4 (four) times daily.   bisacodyl 5 MG EC tablet Generic drug:  bisacodyl Take ONE (1) tablet by mouth twice daily as needed FOR MODERATE CONSTIPATION   buPROPion 150 MG 24 hr tablet Commonly known as:  WELLBUTRIN XL Take 1 tablet (150 mg total) by mouth daily.   camphor-menthol lotion Commonly known as:  SARNA Apply topically as needed for itching.   dexlansoprazole 60 MG capsule Commonly known as:  DEXILANT Take 1 capsule (60 mg total) by mouth daily.   docusate 50 MG/5ML liquid Commonly known as:  COLACE Take 10 mLs (100 mg total) by mouth 2 (two) times daily. What changed:  when to take this   famotidine 20 MG tablet Commonly known as:  PEPCID Take 1 tablet (20 mg total) by mouth 2 (two) times daily. Take around antibiotics infusion time What changed:  Another medication with the same name  was added. Make sure you understand how and when to take each.   famotidine 10 MG tablet Commonly known as:  PEPCID Take 1 tablet (10 mg total) by mouth every 8 (eight) hours. What changed:  You were already taking a medication with the same name, and this prescription was added. Make sure you understand how and when to take each.   fentaNYL 75 MCG/HR Commonly known as:  DURAGESIC - dosed mcg/hr place 1 patch (65mcg total) onto the skin every 3 (three) days   ferrous sulfate 325 (65 FE) MG tablet Take 1 tablet (325 mg total) by mouth daily with breakfast.   free water Soln Place 200 mLs into feeding tube every 8 (eight) hours. What changed:  how much to take   glucagon 1 MG injection Commonly known as:  GLUCAGON EMERGENCY INJECT INTO MUSCLE ONCE AS NEEDED What changed:  how much to take  how to take this  when to take this  reasons to take this  additional instructions   GLUCERNA Liqd 1,000 mLs by PEG Tube route continuous. 70 ml/hr   hydrocortisone 10 MG tablet Commonly known as:  CORTEF Take 1 tablet (10 mg total) by mouth every evening. Hold  for the next 5 days, while using solumedrol. Then resume as previously instructed What changed:  additional instructions   hydrocortisone 20 MG tablet Commonly known as:  CORTEF Take 1 tablet (20 mg total) by mouth every morning. Hold for the next 5 days, while on solumedrol. Resume after this time as previously What changed:  additional instructions   HYDROmorphone 8 MG tablet Commonly known as:  DILAUDID Take ONE (1) tablet (8mg  total) by mouth every 6 hours as needed for severe pain What changed:  how much to take  how to take this  when to take this  reasons to take this  additional instructions   insulin glargine 100 UNIT/ML injection Commonly known as:  LANTUS Inject 0.22 mLs (22 Units total) into the skin at bedtime.   loratadine 10 MG tablet Commonly known as:  CLARITIN Take 1 tablet (10 mg total) by  mouth daily.   LORazepam 0.5 MG tablet Commonly known as:  ATIVAN Take 1 tablet (0.5 mg total) by mouth 3 (three) times daily as needed for anxiety. Reported on 11/02/2015 What changed:  how much to take  additional instructions   LYRICA 100 MG capsule Generic drug:  pregabalin Take ONE (1)  capsule by mouth three times daily (morning,afternoon and evening) and 2 capsules at bedtime What changed:  See the new instructions.   Magnesium Oxide 400 (240 Mg) MG Tabs Take ONE (1) tablet by mouth twice daily   methylPREDNISolone sodium succinate 40 mg/mL injection Commonly known as:  SOLU-MEDROL Inject 0.5 mLs (20 mg total) into the vein every 8 (eight) hours. What changed:  when to take this  additional instructions   midodrine 10 MG tablet Commonly known as:  PROAMATINE Take 2 tablets (20 mg total) by mouth 3 (three) times daily with meals. What changed:  how much to take  when to take this  reasons to take this   multivitamin with minerals Tabs tablet Place 1 tablet into feeding tube daily.   mupirocin ointment 2 % Commonly known as:  BACTROBAN Apply to affected area twice a day as needed   NOVOLOG FLEXPEN 100 UNIT/ML FlexPen Generic drug:  insulin aspart Inject 0-10 Units into the skin 4 (four) times daily as needed for high blood sugar (CBG >200). Per sliding scale: CBG <200 = 0 units, 201-300 = 4 units, 301-400 = 6 units, >400 = 10 units   ondansetron 8 MG disintegrating tablet Commonly known as:  ZOFRAN ODT Place 1 tablet (8 mg total) into feeding tube every 8 (eight) hours as needed for nausea or vomiting.   oxybutynin 5 MG tablet Commonly known as:  DITROPAN Place 1 tablet (5 mg total) into feeding tube 2 (two) times daily.   Oxycodone HCl 20 MG Tabs Take 1 tablet (20 mg total) by mouth every 6 (six) hours as needed (pain).   UNABLE TO FIND AQUACEL: Apply to feet and toes daily   VOLTAREN 1 % Gel Generic drug:  diclofenac sodium apply 2 grams  topically twice a day What changed:  See the new instructions.       Allergies  Allergen Reactions  . Amikacin Sulfate Other (See Comments)    Seizure   . Cefuroxime Axetil Anaphylaxis    Tolerated cefepime 12/2015 - with Pepcid/Solu-Medrol  . Ertapenem Other (See Comments)    Rash and confusion-->tolerated Imipenem   . Morphine And Related Other (See Comments)    Changed mental status, confusion, headache, visual hallucination  . Nsaids Itching and Other (See  Comments)    Risk of bleeding, itching  . Penicillins Anaphylaxis and Other (See Comments)    Tolerated Imipenem; no reaction to 7 day course of amoxicillin in 2015 Has patient had a PCN reaction causing immediate rash, facial/tongue/throat swelling, SOB or lightheadedness with hypotension: Yes Has patient had a PCN reaction causing severe rash involving mucus membranes or skin necrosis: No Has patient had a PCN reaction that required hospitalization Yes Has patient had a PCN reaction occurring within the last 10 years: No If all of the above answers are "NO", then may proceed with Cephalo  . Sulfa Antibiotics Anaphylaxis, Shortness Of Breath and Other (See Comments)  . Tessalon [Benzonatate] Anaphylaxis  . Levaquin [Levofloxacin In D5w] Swelling    Hand and forearm swelling  . Shellfish Allergy Itching and Other (See Comments)    Took benadryl to alleviate reaction  . Miripirium Rash and Other (See Comments)    Change in mental status    Consultations:  Infectious disease.  Urology  Procedures (Echo, Carotid, EGD, Colonoscopy, ERCP)   Radiological studies: Ct Head Wo Contrast  Result Date: 08/13/2016 CLINICAL DATA:  30 year old male with altered mental status. Subsequent encounter. EXAM: CT HEAD WITHOUT CONTRAST TECHNIQUE: Contiguous axial images were obtained from the base of the skull through the vertex without intravenous contrast. COMPARISON:  07/24/2016 head CT.  08/17/2014 brain MR. FINDINGS: Brain: No  intracranial hemorrhage or CT evidence of large acute infarct. Mild global atrophy without hydrocephalus. No CT evidence of encephalitis or meningitis however, if this were of clinical concern, further investigation may be necessary. No intracranial mass lesion noted on this unenhanced exam. Vascular: No hyperdense vessel. Skull: No destructive lesion. Sinuses/Orbits: No acute orbital abnormality. Mild mucosal thickening ethmoid sinus air cells, sphenoid sinuses and maxillary sinuses. Other: Caries. IMPRESSION: No acute intracranial abnormality. Mild mucosal thickening ethmoid sinus air cells, sphenoid sinuses and maxillary sinuses. Please see above. Electronically Signed   By: Genia Del M.D.   On: 08/13/2016 12:42   Ct Head Wo Contrast  Result Date: 07/24/2016 CLINICAL DATA:  Initial evaluation for acute altered mental status. EXAM: CT HEAD WITHOUT CONTRAST TECHNIQUE: Contiguous axial images were obtained from the base of the skull through the vertex without intravenous contrast. COMPARISON:  Prior CT from 07/11/2015. FINDINGS: Brain: Cerebral volume within normal limits. No acute intracranial hemorrhage. No evidence for acute large vessel territory infarct. No mass lesion, midline shift or mass effect. No hydrocephalus. No extra-axial fluid collection. Vascular: No hyperdense vessel. Skull: Scalp soft tissues demonstrate no acute abnormality. Calvarium intact. Sinuses/Orbits: Globes and orbital soft tissues within normal limits. Mild scattered mucosal thickening within the sphenoid sinuses. Paranasal sinuses are otherwise clear. No mastoid effusion. IMPRESSION: No acute intracranial process identified. Electronically Signed   By: Jeannine Boga M.D.   On: 07/24/2016 05:39   Ct Chest Wo Contrast  Result Date: 08/13/2016 CLINICAL DATA:  Possible pneumonia EXAM: CT CHEST WITHOUT CONTRAST TECHNIQUE: Multidetector CT imaging of the chest was performed following the standard protocol without IV  contrast. COMPARISON:  Chest x-ray 08/13/2016 FINDINGS: Cardiovascular: Borderline cardiomegaly is noted. No pericardial effusion. There is right arm PICC line with tip in right atrium. Mediastinum/Nodes: No mediastinal adenopathy is noted on this unenhanced scan. Moderate size hiatal hernia measures 5.1 cm. Tracheostomy tube partially visualized. Lungs/Pleura: Images of the lung parenchyma shows consolidation with air bronchogram in right lower lobe consistent with pneumonia. Small patchy infiltrate with air bronchogram noted in left lower lobe posterior medially. Small patchy infiltrate in  posterior aspect right upper lobe. Central airways are patent. Upper Abdomen: The visualized upper abdomen shows no adrenal gland mass. Nonspecific mild left perinephric stranding. Clinical correlation is necessary. Musculoskeletal: No destructive bony lesions are noted. Sagittal images of the spine shows mild compression deformities T7-T8 T10 and T12 vertebral bodies. There is probable healing fracture and Schmorl's node deformity upper endplate of T8 and upper endplate of L1 vertebral body. Clinical correlation is necessary. Further correlation with MRI could be performed as clinically warranted. IMPRESSION: 1. Moderate size hiatal hernia. 2. There is consolidation with air bronchogram in right lower lobe consistent with pneumonia. Small patchy infiltrate/pneumonia in noted in left lower lobe posterior medially. Small patchy infiltrate in posterior aspect of the right upper lobe. No mediastinal hematoma or adenopathy. 3. Tracheostomy tube in place. 4. Sagittal images of the spine shows mild compression deformities T7-T8 T10 and T12 vertebral bodies. There is probable healing fracture and Schmorl's node deformity upper endplate of T8 and upper endplate of L1 vertebral body. Less likely osteomyelitis. Clinical correlation is necessary. Further correlation with MRI could be performed as clinically warranted. Electronically Signed    By: Lahoma Crocker M.D.   On: 08/13/2016 13:02   Dg Abdomen Peg Tube Location  Result Date: 08/16/2016 CLINICAL DATA:  Peg tube leaking. EXAM: ABDOMEN - 1 VIEW COMPARISON:  Chest CT - 08/13/2016. FINDINGS: Contrast has been injected via the gastrostomy tube with opacification of the stomach and proximal duodenum. Paucity of bowel gas without evidence of enteric obstruction. No supine evidence of pneumoperitoneum. No pneumatosis or portal venous gas. Limited visualization of the lower thorax demonstrates right basilar heterogeneous opacities with suspected air bronchograms compatible with the findings seen on preceding chest CT. No definite acute or aggressive osseous abnormalities. IMPRESSION: 1. Contrast injection of gastrostomy tube demonstrates opacification of the stomach and proximal small bowel. No evidence of enteric obstruction. 2. Findings worrisome for right lower lung pneumonia, similar to the 08/13/2016 examination. Electronically Signed   By: Sandi Mariscal M.D.   On: 08/16/2016 13:14   Dg Chest Port 1 View  Result Date: 08/13/2016 CLINICAL DATA:  Dyspnea.  Lung congestion. EXAM: PORTABLE CHEST 1 VIEW COMPARISON:  07/24/2016 FINDINGS: Tracheostomy tube tip is above the carina. There is a right arm PICC line with tip in the inferior right atrium. Normal heart size. Small right pleural effusion is again noted. Persistent airspace consolidation within the right lower lobe. IMPRESSION: Persistent right lower lobe pneumonia. Low position of right arm PICC line with tip in the inferior right atrium. Electronically Signed   By: Kerby Moors M.D.   On: 08/13/2016 08:11   Dg Chest Port 1 View  Result Date: 07/24/2016 CLINICAL DATA:  Acute onset of altered mental status. Shortness of breath. Initial encounter. EXAM: PORTABLE CHEST 1 VIEW COMPARISON:  Chest radiograph performed 04/19/2016 FINDINGS: The patient's tracheostomy tube is seen ending 4 cm above the carina. Right basilar airspace opacity raises  concern for pneumonia. Small bilateral pleural effusions are suspected. No pneumothorax is seen. The cardiomediastinal silhouette is borderline normal in size. No acute osseous abnormalities are identified. IMPRESSION: Right basilar airspace opacity is concerning for pneumonia. Suspect small bilateral pleural effusions. Electronically Signed   By: Garald Balding M.D.   On: 07/24/2016 02:09     Subjective:  Discharge Exam: Vitals:   08/17/16 2328 08/18/16 0545 08/18/16 0751 08/18/16 0827  BP:  110/77  122/76  Pulse:  96 94 (!) 107  Resp: 16 20 18 16   Temp:  Marland Kitchen)  100.4 F (38 C)  (!) 100.7 F (38.2 C)  TempSrc:  Oral  Oral  SpO2:  100% 99% 100%  Weight:      Height:       General: Pt is alert, awake, not in acute distress Cardiovascular: RRR, S1/S2 +, no rubs, no gallops Respiratory: CTA bilaterally, no wheezing, no rhonchi Abdominal: Soft, NT, ND, bowel sounds + Extremities: no edema, no cyanosis   The results of significant diagnostics from this hospitalization (including imaging, microbiology, ancillary and laboratory) are listed below for reference.    Microbiology: Recent Results (from the past 240 hour(s))  Urine culture     Status: Abnormal   Collection Time: 08/09/16  9:05 PM  Result Value Ref Range Status   Specimen Description URINE, RANDOM  Final   Special Requests NONE  Final   Culture MULTIPLE SPECIES PRESENT, SUGGEST RECOLLECTION (A)  Final   Report Status 08/11/2016 FINAL  Final  Blood Culture (routine x 2)     Status: None   Collection Time: 08/09/16 10:30 PM  Result Value Ref Range Status   Specimen Description BLOOD LEFT HAND  Final   Special Requests IN PEDIATRIC BOTTLE 1CC  Final   Culture NO GROWTH 5 DAYS  Final   Report Status 08/14/2016 FINAL  Final  Blood Culture (routine x 2)     Status: None   Collection Time: 08/09/16 10:45 PM  Result Value Ref Range Status   Specimen Description BLOOD RIGHT HAND  Final   Special Requests IN PEDIATRIC BOTTLE  2ML  Final   Culture NO GROWTH 5 DAYS  Final   Report Status 08/14/2016 FINAL  Final  Culture, blood (Routine X 2) w Reflex to ID Panel     Status: None   Collection Time: 08/11/16 11:40 AM  Result Value Ref Range Status   Specimen Description BLOOD LEFT ANTECUBITAL  Final   Special Requests IN PEDIATRIC BOTTLE 1CC  Final   Culture NO GROWTH 5 DAYS  Final   Report Status 08/16/2016 FINAL  Final  Culture, blood (Routine X 2) w Reflex to ID Panel     Status: None   Collection Time: 08/11/16 11:45 AM  Result Value Ref Range Status   Specimen Description BLOOD BLOOD LEFT HAND  Final   Special Requests IN PEDIATRIC BOTTLE 3CC  Final   Culture NO GROWTH 5 DAYS  Final   Report Status 08/16/2016 FINAL  Final     Labs: BNP (last 3 results) No results for input(s): BNP in the last 8760 hours. Basic Metabolic Panel:  Recent Labs Lab 08/15/16 0507 08/15/16 1520 08/16/16 0500 08/17/16 0500 08/18/16 0450  NA 132* 132* 135 136 133*  K 5.4* 4.5 4.4 4.4 5.0  CL 105 106 105 107 103  CO2 21* 19* 23 23 23   GLUCOSE 418* 423* 322* 139* 182*  BUN 28* 32* 25* 20 19  CREATININE 0.72 0.77 0.64 0.52* 0.56*  CALCIUM 8.7* 8.5* 8.8* 8.7* 8.7*   Liver Function Tests:  Recent Labs Lab 08/12/16 0500  AST 13*  ALT 9*  ALKPHOS 106  BILITOT 0.3  PROT 6.3*  ALBUMIN 1.5*   No results for input(s): LIPASE, AMYLASE in the last 168 hours. No results for input(s): AMMONIA in the last 168 hours. CBC:  Recent Labs Lab 08/14/16 0447 08/15/16 0507 08/16/16 0500 08/17/16 0500 08/18/16 0450  WBC 28.2* 37.0* 33.5* 24.6* 21.3*  HGB 9.1* 9.8* 9.8* 8.8* 9.4*  HCT 29.6* 31.2* 31.6* 28.2* 30.5*  MCV 84.3  85.5 85.2 85.2 85.0  PLT 405* 453* 508* 466* 545*   Cardiac Enzymes: No results for input(s): CKTOTAL, CKMB, CKMBINDEX, TROPONINI in the last 168 hours. BNP: Invalid input(s): POCBNP CBG:  Recent Labs Lab 08/17/16 1652 08/17/16 2022 08/18/16 0007 08/18/16 0405 08/18/16 0825  GLUCAP 102*  113* 110* 176* 184*   D-Dimer No results for input(s): DDIMER in the last 72 hours. Hgb A1c No results for input(s): HGBA1C in the last 72 hours. Lipid Profile No results for input(s): CHOL, HDL, LDLCALC, TRIG, CHOLHDL, LDLDIRECT in the last 72 hours. Thyroid function studies No results for input(s): TSH, T4TOTAL, T3FREE, THYROIDAB in the last 72 hours.  Invalid input(s): FREET3 Anemia work up No results for input(s): VITAMINB12, FOLATE, FERRITIN, TIBC, IRON, RETICCTPCT in the last 72 hours. Urinalysis    Component Value Date/Time   COLORURINE YELLOW 08/11/2016 1205   APPEARANCEUR CLOUDY (A) 08/11/2016 1205   LABSPEC 1.015 08/11/2016 1205   PHURINE 5.5 08/11/2016 1205   GLUCOSEU NEGATIVE 08/11/2016 1205   HGBUR MODERATE (A) 08/11/2016 1205   BILIRUBINUR NEGATIVE 08/11/2016 1205   BILIRUBINUR neg 06/28/2014 1445   KETONESUR NEGATIVE 08/11/2016 1205   PROTEINUR 100 (A) 08/11/2016 1205   UROBILINOGEN 0.2 07/27/2015 0645   NITRITE NEGATIVE 08/11/2016 1205   LEUKOCYTESUR SMALL (A) 08/11/2016 1205   Sepsis Labs Invalid input(s): PROCALCITONIN,  WBC,  LACTICIDVEN Microbiology Recent Results (from the past 240 hour(s))  Urine culture     Status: Abnormal   Collection Time: 08/09/16  9:05 PM  Result Value Ref Range Status   Specimen Description URINE, RANDOM  Final   Special Requests NONE  Final   Culture MULTIPLE SPECIES PRESENT, SUGGEST RECOLLECTION (A)  Final   Report Status 08/11/2016 FINAL  Final  Blood Culture (routine x 2)     Status: None   Collection Time: 08/09/16 10:30 PM  Result Value Ref Range Status   Specimen Description BLOOD LEFT HAND  Final   Special Requests IN PEDIATRIC BOTTLE [place]  Final   Culture NO GROWTH 5 DAYS  Final   Report Status 08/14/2016 FINAL  Final  Blood Culture (routine x 2)     Status: None   Collection Time: 08/09/16 10:45 PM  Result Value Ref Range Status   Specimen Description BLOOD RIGHT HAND  Final   Special Requests IN PEDIATRIC  BOTTLE 2ML  Final   Culture NO GROWTH 5 DAYS  Final   Report Status 08/14/2016 FINAL  Final  Culture, blood (Routine X 2) w Reflex to ID Panel     Status: None   Collection Time: 08/11/16 11:40 AM  Result Value Ref Range Status   Specimen Description BLOOD LEFT ANTECUBITAL  Final   Special Requests IN PEDIATRIC BOTTLE [place]  Final   Culture NO GROWTH 5 DAYS  Final   Report Status 08/16/2016 FINAL  Final  Culture, blood (Routine X 2) w Reflex to ID Panel     Status: None   Collection Time: 08/11/16 11:45 AM  Result Value Ref Range Status   Specimen Description BLOOD BLOOD LEFT HAND  Final   Special Requests IN PEDIATRIC BOTTLE 3CC  Final   Culture NO GROWTH 5 DAYS  Final   Report Status 08/16/2016 FINAL  Final     Time coordinating discharge: Over 30 minutes  SIGNED:   Birdie Hopes, MD  [facility] 08/18/2016, 10:33 AM Pager   If 7PM-7AM, please contact night-coverage [email or web address] Password [email or web address]

## 2016-08-18 NOTE — Care Management (Addendum)
Home Health needs all arranged,Orders faxed to [facility] pharmacy for antibiotics, confirmed receipt with [facility] liaison for [facility].  Patient is set up tol receive initial Home dose of Azactam tonight at 9pm, as per [facility]. CM discussed with patient and Mother who are agreeable to transitional care planning. Updated Rodena Piety RN on 6E. No further CM needs identified.

## 2016-08-18 NOTE — Care Management (Addendum)
[facility] called at 2:45pm for non emergency ambulance transport home.

## 2016-08-22 ENCOUNTER — Telehealth: Payer: Self-pay | Admitting: Family Medicine

## 2016-08-22 ENCOUNTER — Other Ambulatory Visit: Payer: Self-pay | Admitting: Family Medicine

## 2016-08-22 NOTE — Telephone Encounter (Signed)
° ° °  Pt nurse Jeneen Rinks Ewing call to req a refill  oxybutynin (DITROPAN) 5 MG tablet   [place]

## 2016-08-22 NOTE — Telephone Encounter (Signed)
Tell his mother that I cannot prescribe his pain meds much longer due to the recent state laws passed this year. I suggest that I refer him to [facility] [place] to mange these medications. Tell he that this would be for "palliative care" to keep him comfortable. He is not a candidate for "hospice" per se. The staff could also help her out around the home quite a bit. If [place] agrees I will set this up ASAP

## 2016-08-22 NOTE — Telephone Encounter (Signed)
° °  Pt request refill of the following:   Oxycodone HCl 20 MG TABS  HYDROmorphone (DILAUDID) 8 MG tablet    fentaNYL (DURAGESIC - DOSED MCG/HR) 75 MCG/HR

## 2016-08-22 NOTE — Telephone Encounter (Signed)
Okay to order Santyl ointment for pt?

## 2016-08-23 MED ORDER — OXYBUTYNIN CHLORIDE 5 MG PO TABS: 5.0000 mg | ORAL_TABLET | Freq: Two times a day (BID) | ORAL | 2 refills | Status: AC

## 2016-08-23 NOTE — Telephone Encounter (Signed)
Sunday Spillers, pt's father would like you to call pt's mother regarding message from Dr. Sarajane Jews. He said pt needs his pain medications.

## 2016-08-23 NOTE — Telephone Encounter (Signed)
Spoke to pt's mother [employer] told her Rx was sent to pharmacy as requested by his nurse. [name] verbalized understanding.

## 2016-08-24 MED ORDER — HYDROMORPHONE HCL 8 MG PO TABS: ORAL_TABLET | ORAL | 0 refills | Status: AC

## 2016-08-24 MED ORDER — COLLAGENASE 250 UNIT/GM EX OINT: 1.0000 "application " | TOPICAL_OINTMENT | CUTANEOUS | 5 refills | Status: AC

## 2016-08-24 MED ORDER — FENTANYL 75 MCG/HR TD PT72: MEDICATED_PATCH | TRANSDERMAL | 0 refills | Status: AC

## 2016-08-24 MED ORDER — OXYCODONE HCL 20 MG PO TABS: 20.0000 mg | ORAL_TABLET | Freq: Four times a day (QID) | ORAL | 0 refills | Status: AC | PRN

## 2016-08-24 NOTE — Telephone Encounter (Signed)
Written rx are ready. Also call in Santyl 90 gm with 5 rf

## 2016-08-24 NOTE — Telephone Encounter (Signed)
3 scripts are ready for pick up, I sent Santyl e-scribe to [facility] and spoke with mom.

## 2016-08-24 NOTE — Telephone Encounter (Signed)
I spoke with pt's mom and she has [place] working on Hosp[facility] for pt. Can you refill this for 1 month supply ?

## 2016-08-26 ENCOUNTER — Encounter (HOSPITAL_COMMUNITY): Payer: Self-pay | Admitting: Emergency Medicine

## 2016-08-26 ENCOUNTER — Emergency Department (HOSPITAL_COMMUNITY): Payer: Medicaid Other

## 2016-08-26 ENCOUNTER — Inpatient Hospital Stay (HOSPITAL_COMMUNITY)
Admission: EM | Admit: 2016-08-26 | Discharge: 2016-10-08 | DRG: 853 | Disposition: E | Payer: Medicaid Other | Attending: Pulmonary Disease | Admitting: Pulmonary Disease

## 2016-08-26 DIAGNOSIS — I638 Other cerebral infarction: Secondary | ICD-10-CM | POA: Diagnosis not present

## 2016-08-26 DIAGNOSIS — M009 Pyogenic arthritis, unspecified: Secondary | ICD-10-CM | POA: Diagnosis present

## 2016-08-26 DIAGNOSIS — K921 Melena: Secondary | ICD-10-CM | POA: Diagnosis not present

## 2016-08-26 DIAGNOSIS — R4020 Unspecified coma: Secondary | ICD-10-CM

## 2016-08-26 DIAGNOSIS — J9611 Chronic respiratory failure with hypoxia: Secondary | ICD-10-CM | POA: Diagnosis not present

## 2016-08-26 DIAGNOSIS — D6959 Other secondary thrombocytopenia: Secondary | ICD-10-CM | POA: Diagnosis present

## 2016-08-26 DIAGNOSIS — G40901 Epilepsy, unspecified, not intractable, with status epilepticus: Secondary | ICD-10-CM | POA: Diagnosis not present

## 2016-08-26 DIAGNOSIS — E874 Mixed disorder of acid-base balance: Secondary | ICD-10-CM | POA: Diagnosis not present

## 2016-08-26 DIAGNOSIS — L89224 Pressure ulcer of left hip, stage 4: Secondary | ICD-10-CM | POA: Diagnosis present

## 2016-08-26 DIAGNOSIS — A419 Sepsis, unspecified organism: Secondary | ICD-10-CM

## 2016-08-26 DIAGNOSIS — L89314 Pressure ulcer of right buttock, stage 4: Secondary | ICD-10-CM | POA: Diagnosis present

## 2016-08-26 DIAGNOSIS — A409 Streptococcal sepsis, unspecified: Principal | ICD-10-CM | POA: Diagnosis present

## 2016-08-26 DIAGNOSIS — R7881 Bacteremia: Secondary | ICD-10-CM | POA: Diagnosis not present

## 2016-08-26 DIAGNOSIS — G9341 Metabolic encephalopathy: Secondary | ICD-10-CM | POA: Diagnosis present

## 2016-08-26 DIAGNOSIS — E2740 Unspecified adrenocortical insufficiency: Secondary | ICD-10-CM | POA: Diagnosis present

## 2016-08-26 DIAGNOSIS — Z881 Allergy status to other antibiotic agents status: Secondary | ICD-10-CM

## 2016-08-26 DIAGNOSIS — N390 Urinary tract infection, site not specified: Secondary | ICD-10-CM | POA: Diagnosis present

## 2016-08-26 DIAGNOSIS — K567 Ileus, unspecified: Secondary | ICD-10-CM

## 2016-08-26 DIAGNOSIS — I639 Cerebral infarction, unspecified: Secondary | ICD-10-CM | POA: Diagnosis not present

## 2016-08-26 DIAGNOSIS — I82409 Acute embolism and thrombosis of unspecified deep veins of unspecified lower extremity: Secondary | ICD-10-CM | POA: Diagnosis present

## 2016-08-26 DIAGNOSIS — K9423 Gastrostomy malfunction: Secondary | ICD-10-CM

## 2016-08-26 DIAGNOSIS — D500 Iron deficiency anemia secondary to blood loss (chronic): Secondary | ICD-10-CM | POA: Diagnosis present

## 2016-08-26 DIAGNOSIS — J9690 Respiratory failure, unspecified, unspecified whether with hypoxia or hypercapnia: Secondary | ICD-10-CM

## 2016-08-26 DIAGNOSIS — R680 Hypothermia, not associated with low environmental temperature: Secondary | ICD-10-CM | POA: Diagnosis not present

## 2016-08-26 DIAGNOSIS — T82868A Thrombosis of vascular prosthetic devices, implants and grafts, initial encounter: Secondary | ICD-10-CM | POA: Diagnosis not present

## 2016-08-26 DIAGNOSIS — Z66 Do not resuscitate: Secondary | ICD-10-CM | POA: Diagnosis not present

## 2016-08-26 DIAGNOSIS — Z9911 Dependence on respirator [ventilator] status: Secondary | ICD-10-CM | POA: Diagnosis not present

## 2016-08-26 DIAGNOSIS — Z681 Body mass index (BMI) 19 or less, adult: Secondary | ICD-10-CM

## 2016-08-26 DIAGNOSIS — I82B11 Acute embolism and thrombosis of right subclavian vein: Secondary | ICD-10-CM | POA: Diagnosis not present

## 2016-08-26 DIAGNOSIS — G061 Intraspinal abscess and granuloma: Secondary | ICD-10-CM | POA: Diagnosis not present

## 2016-08-26 DIAGNOSIS — K7290 Hepatic failure, unspecified without coma: Secondary | ICD-10-CM

## 2016-08-26 DIAGNOSIS — I2782 Chronic pulmonary embolism: Secondary | ICD-10-CM | POA: Diagnosis not present

## 2016-08-26 DIAGNOSIS — R6521 Severe sepsis with septic shock: Secondary | ICD-10-CM | POA: Diagnosis present

## 2016-08-26 DIAGNOSIS — B961 Klebsiella pneumoniae [K. pneumoniae] as the cause of diseases classified elsewhere: Secondary | ICD-10-CM | POA: Diagnosis present

## 2016-08-26 DIAGNOSIS — L89154 Pressure ulcer of sacral region, stage 4: Secondary | ICD-10-CM | POA: Diagnosis present

## 2016-08-26 DIAGNOSIS — J984 Other disorders of lung: Secondary | ICD-10-CM | POA: Diagnosis not present

## 2016-08-26 DIAGNOSIS — E871 Hypo-osmolality and hyponatremia: Secondary | ICD-10-CM | POA: Diagnosis present

## 2016-08-26 DIAGNOSIS — J9602 Acute respiratory failure with hypercapnia: Secondary | ICD-10-CM

## 2016-08-26 DIAGNOSIS — Z8673 Personal history of transient ischemic attack (TIA), and cerebral infarction without residual deficits: Secondary | ICD-10-CM

## 2016-08-26 DIAGNOSIS — Z886 Allergy status to analgesic agent status: Secondary | ICD-10-CM

## 2016-08-26 DIAGNOSIS — J45909 Unspecified asthma, uncomplicated: Secondary | ICD-10-CM | POA: Diagnosis present

## 2016-08-26 DIAGNOSIS — K7200 Acute and subacute hepatic failure without coma: Secondary | ICD-10-CM | POA: Diagnosis not present

## 2016-08-26 DIAGNOSIS — I959 Hypotension, unspecified: Secondary | ICD-10-CM | POA: Diagnosis not present

## 2016-08-26 DIAGNOSIS — T361X5A Adverse effect of cephalosporins and other beta-lactam antibiotics, initial encounter: Secondary | ICD-10-CM | POA: Diagnosis not present

## 2016-08-26 DIAGNOSIS — Z515 Encounter for palliative care: Secondary | ICD-10-CM | POA: Diagnosis not present

## 2016-08-26 DIAGNOSIS — R410 Disorientation, unspecified: Secondary | ICD-10-CM | POA: Diagnosis not present

## 2016-08-26 DIAGNOSIS — M797 Fibromyalgia: Secondary | ICD-10-CM | POA: Diagnosis present

## 2016-08-26 DIAGNOSIS — Z7189 Other specified counseling: Secondary | ICD-10-CM | POA: Diagnosis not present

## 2016-08-26 DIAGNOSIS — E1022 Type 1 diabetes mellitus with diabetic chronic kidney disease: Secondary | ICD-10-CM | POA: Diagnosis present

## 2016-08-26 DIAGNOSIS — J9610 Chronic respiratory failure, unspecified whether with hypoxia or hypercapnia: Secondary | ICD-10-CM | POA: Diagnosis present

## 2016-08-26 DIAGNOSIS — K219 Gastro-esophageal reflux disease without esophagitis: Secondary | ICD-10-CM | POA: Diagnosis present

## 2016-08-26 DIAGNOSIS — L89324 Pressure ulcer of left buttock, stage 4: Secondary | ICD-10-CM | POA: Diagnosis present

## 2016-08-26 DIAGNOSIS — Z833 Family history of diabetes mellitus: Secondary | ICD-10-CM

## 2016-08-26 DIAGNOSIS — N179 Acute kidney failure, unspecified: Secondary | ICD-10-CM

## 2016-08-26 DIAGNOSIS — K922 Gastrointestinal hemorrhage, unspecified: Secondary | ICD-10-CM

## 2016-08-26 DIAGNOSIS — E10319 Type 1 diabetes mellitus with unspecified diabetic retinopathy without macular edema: Secondary | ICD-10-CM | POA: Diagnosis present

## 2016-08-26 DIAGNOSIS — J9621 Acute and chronic respiratory failure with hypoxia: Secondary | ICD-10-CM | POA: Diagnosis present

## 2016-08-26 DIAGNOSIS — I5033 Acute on chronic diastolic (congestive) heart failure: Secondary | ICD-10-CM | POA: Diagnosis present

## 2016-08-26 DIAGNOSIS — E870 Hyperosmolality and hypernatremia: Secondary | ICD-10-CM | POA: Diagnosis present

## 2016-08-26 DIAGNOSIS — M86659 Other chronic osteomyelitis, unspecified thigh: Secondary | ICD-10-CM | POA: Diagnosis not present

## 2016-08-26 DIAGNOSIS — R627 Adult failure to thrive: Secondary | ICD-10-CM | POA: Diagnosis present

## 2016-08-26 DIAGNOSIS — Y95 Nosocomial condition: Secondary | ICD-10-CM | POA: Diagnosis present

## 2016-08-26 DIAGNOSIS — B952 Enterococcus as the cause of diseases classified elsewhere: Secondary | ICD-10-CM | POA: Diagnosis present

## 2016-08-26 DIAGNOSIS — L02214 Cutaneous abscess of groin: Secondary | ICD-10-CM | POA: Diagnosis present

## 2016-08-26 DIAGNOSIS — A408 Other streptococcal sepsis: Secondary | ICD-10-CM | POA: Diagnosis not present

## 2016-08-26 DIAGNOSIS — E1069 Type 1 diabetes mellitus with other specified complication: Secondary | ICD-10-CM | POA: Diagnosis present

## 2016-08-26 DIAGNOSIS — R197 Diarrhea, unspecified: Secondary | ICD-10-CM | POA: Diagnosis present

## 2016-08-26 DIAGNOSIS — E1043 Type 1 diabetes mellitus with diabetic autonomic (poly)neuropathy: Secondary | ICD-10-CM | POA: Diagnosis present

## 2016-08-26 DIAGNOSIS — J939 Pneumothorax, unspecified: Secondary | ICD-10-CM

## 2016-08-26 DIAGNOSIS — J189 Pneumonia, unspecified organism: Secondary | ICD-10-CM

## 2016-08-26 DIAGNOSIS — E10649 Type 1 diabetes mellitus with hypoglycemia without coma: Secondary | ICD-10-CM | POA: Diagnosis present

## 2016-08-26 DIAGNOSIS — D62 Acute posthemorrhagic anemia: Secondary | ICD-10-CM | POA: Diagnosis present

## 2016-08-26 DIAGNOSIS — Z931 Gastrostomy status: Secondary | ICD-10-CM

## 2016-08-26 DIAGNOSIS — G9340 Encephalopathy, unspecified: Secondary | ICD-10-CM | POA: Diagnosis not present

## 2016-08-26 DIAGNOSIS — Z880 Allergy status to penicillin: Secondary | ICD-10-CM

## 2016-08-26 DIAGNOSIS — R0989 Other specified symptoms and signs involving the circulatory and respiratory systems: Secondary | ICD-10-CM

## 2016-08-26 DIAGNOSIS — K6812 Psoas muscle abscess: Secondary | ICD-10-CM | POA: Diagnosis present

## 2016-08-26 DIAGNOSIS — R1314 Dysphagia, pharyngoesophageal phase: Secondary | ICD-10-CM | POA: Diagnosis present

## 2016-08-26 DIAGNOSIS — Z86711 Personal history of pulmonary embolism: Secondary | ICD-10-CM | POA: Diagnosis present

## 2016-08-26 DIAGNOSIS — Z823 Family history of stroke: Secondary | ICD-10-CM

## 2016-08-26 DIAGNOSIS — Z86718 Personal history of other venous thrombosis and embolism: Secondary | ICD-10-CM | POA: Diagnosis not present

## 2016-08-26 DIAGNOSIS — T782XXA Anaphylactic shock, unspecified, initial encounter: Secondary | ICD-10-CM

## 2016-08-26 DIAGNOSIS — I2699 Other pulmonary embolism without acute cor pulmonale: Secondary | ICD-10-CM | POA: Diagnosis not present

## 2016-08-26 DIAGNOSIS — L89214 Pressure ulcer of right hip, stage 4: Secondary | ICD-10-CM | POA: Diagnosis present

## 2016-08-26 DIAGNOSIS — R221 Localized swelling, mass and lump, neck: Secondary | ICD-10-CM | POA: Diagnosis not present

## 2016-08-26 DIAGNOSIS — M4628 Osteomyelitis of vertebra, sacral and sacrococcygeal region: Secondary | ICD-10-CM | POA: Diagnosis present

## 2016-08-26 DIAGNOSIS — R4182 Altered mental status, unspecified: Secondary | ICD-10-CM | POA: Diagnosis present

## 2016-08-26 DIAGNOSIS — E1065 Type 1 diabetes mellitus with hyperglycemia: Secondary | ICD-10-CM | POA: Diagnosis present

## 2016-08-26 DIAGNOSIS — N319 Neuromuscular dysfunction of bladder, unspecified: Secondary | ICD-10-CM | POA: Diagnosis present

## 2016-08-26 DIAGNOSIS — Z885 Allergy status to narcotic agent status: Secondary | ICD-10-CM

## 2016-08-26 DIAGNOSIS — J9600 Acute respiratory failure, unspecified whether with hypoxia or hypercapnia: Secondary | ICD-10-CM | POA: Diagnosis not present

## 2016-08-26 DIAGNOSIS — R401 Stupor: Secondary | ICD-10-CM | POA: Diagnosis not present

## 2016-08-26 DIAGNOSIS — G8250 Quadriplegia, unspecified: Secondary | ICD-10-CM | POA: Diagnosis not present

## 2016-08-26 DIAGNOSIS — Z794 Long term (current) use of insulin: Secondary | ICD-10-CM

## 2016-08-26 DIAGNOSIS — Z87828 Personal history of other (healed) physical injury and trauma: Secondary | ICD-10-CM

## 2016-08-26 DIAGNOSIS — R400 Somnolence: Secondary | ICD-10-CM | POA: Diagnosis not present

## 2016-08-26 DIAGNOSIS — I82621 Acute embolism and thrombosis of deep veins of right upper extremity: Secondary | ICD-10-CM | POA: Diagnosis not present

## 2016-08-26 DIAGNOSIS — R609 Edema, unspecified: Secondary | ICD-10-CM | POA: Diagnosis not present

## 2016-08-26 DIAGNOSIS — G40911 Epilepsy, unspecified, intractable, with status epilepticus: Secondary | ICD-10-CM | POA: Diagnosis not present

## 2016-08-26 DIAGNOSIS — Z888 Allergy status to other drugs, medicaments and biological substances status: Secondary | ICD-10-CM

## 2016-08-26 DIAGNOSIS — D638 Anemia in other chronic diseases classified elsewhere: Secondary | ICD-10-CM | POA: Diagnosis present

## 2016-08-26 DIAGNOSIS — Z889 Allergy status to unspecified drugs, medicaments and biological substances status: Secondary | ICD-10-CM

## 2016-08-26 DIAGNOSIS — Z9359 Other cystostomy status: Secondary | ICD-10-CM

## 2016-08-26 DIAGNOSIS — E10621 Type 1 diabetes mellitus with foot ulcer: Secondary | ICD-10-CM | POA: Diagnosis present

## 2016-08-26 DIAGNOSIS — R402444 Other coma, without documented Glasgow coma scale score, or with partial score reported, 24 hours or more after hospital admission: Secondary | ICD-10-CM | POA: Diagnosis not present

## 2016-08-26 DIAGNOSIS — E872 Acidosis: Secondary | ICD-10-CM

## 2016-08-26 DIAGNOSIS — Y848 Other medical procedures as the cause of abnormal reaction of the patient, or of later complication, without mention of misadventure at the time of the procedure: Secondary | ICD-10-CM | POA: Diagnosis not present

## 2016-08-26 DIAGNOSIS — R0682 Tachypnea, not elsewhere classified: Secondary | ICD-10-CM | POA: Diagnosis not present

## 2016-08-26 DIAGNOSIS — J9601 Acute respiratory failure with hypoxia: Secondary | ICD-10-CM

## 2016-08-26 DIAGNOSIS — J9691 Respiratory failure, unspecified with hypoxia: Secondary | ICD-10-CM

## 2016-08-26 DIAGNOSIS — Z933 Colostomy status: Secondary | ICD-10-CM

## 2016-08-26 DIAGNOSIS — T380X5A Adverse effect of glucocorticoids and synthetic analogues, initial encounter: Secondary | ICD-10-CM | POA: Diagnosis not present

## 2016-08-26 DIAGNOSIS — R532 Functional quadriplegia: Secondary | ICD-10-CM | POA: Diagnosis present

## 2016-08-26 DIAGNOSIS — E860 Dehydration: Secondary | ICD-10-CM | POA: Diagnosis present

## 2016-08-26 DIAGNOSIS — Z452 Encounter for adjustment and management of vascular access device: Secondary | ICD-10-CM

## 2016-08-26 DIAGNOSIS — E43 Unspecified severe protein-calorie malnutrition: Secondary | ICD-10-CM | POA: Diagnosis present

## 2016-08-26 DIAGNOSIS — D696 Thrombocytopenia, unspecified: Secondary | ICD-10-CM

## 2016-08-26 DIAGNOSIS — Z91013 Allergy to seafood: Secondary | ICD-10-CM

## 2016-08-26 DIAGNOSIS — E861 Hypovolemia: Secondary | ICD-10-CM | POA: Diagnosis not present

## 2016-08-26 DIAGNOSIS — Z930 Tracheostomy status: Secondary | ICD-10-CM

## 2016-08-26 DIAGNOSIS — L97529 Non-pressure chronic ulcer of other part of left foot with unspecified severity: Secondary | ICD-10-CM | POA: Diagnosis present

## 2016-08-26 DIAGNOSIS — A498 Other bacterial infections of unspecified site: Secondary | ICD-10-CM | POA: Diagnosis not present

## 2016-08-26 DIAGNOSIS — J690 Pneumonitis due to inhalation of food and vomit: Secondary | ICD-10-CM | POA: Diagnosis present

## 2016-08-26 DIAGNOSIS — B954 Other streptococcus as the cause of diseases classified elsewhere: Secondary | ICD-10-CM | POA: Diagnosis not present

## 2016-08-26 DIAGNOSIS — E876 Hypokalemia: Secondary | ICD-10-CM | POA: Diagnosis present

## 2016-08-26 DIAGNOSIS — G9511 Acute infarction of spinal cord (embolic) (nonembolic): Secondary | ICD-10-CM | POA: Diagnosis not present

## 2016-08-26 DIAGNOSIS — F411 Generalized anxiety disorder: Secondary | ICD-10-CM | POA: Diagnosis present

## 2016-08-26 DIAGNOSIS — B965 Pseudomonas (aeruginosa) (mallei) (pseudomallei) as the cause of diseases classified elsewhere: Secondary | ICD-10-CM | POA: Diagnosis present

## 2016-08-26 DIAGNOSIS — Z8614 Personal history of Methicillin resistant Staphylococcus aureus infection: Secondary | ICD-10-CM

## 2016-08-26 DIAGNOSIS — Z7401 Bed confinement status: Secondary | ICD-10-CM

## 2016-08-26 DIAGNOSIS — A491 Streptococcal infection, unspecified site: Secondary | ICD-10-CM

## 2016-08-26 DIAGNOSIS — Z8249 Family history of ischemic heart disease and other diseases of the circulatory system: Secondary | ICD-10-CM

## 2016-08-26 DIAGNOSIS — Z87891 Personal history of nicotine dependence: Secondary | ICD-10-CM

## 2016-08-26 DIAGNOSIS — Z79891 Long term (current) use of opiate analgesic: Secondary | ICD-10-CM

## 2016-08-26 DIAGNOSIS — Z825 Family history of asthma and other chronic lower respiratory diseases: Secondary | ICD-10-CM

## 2016-08-26 DIAGNOSIS — K3184 Gastroparesis: Secondary | ICD-10-CM | POA: Diagnosis present

## 2016-08-26 DIAGNOSIS — R569 Unspecified convulsions: Secondary | ICD-10-CM | POA: Diagnosis not present

## 2016-08-26 DIAGNOSIS — G8929 Other chronic pain: Secondary | ICD-10-CM | POA: Diagnosis present

## 2016-08-26 DIAGNOSIS — L97519 Non-pressure chronic ulcer of other part of right foot with unspecified severity: Secondary | ICD-10-CM | POA: Diagnosis present

## 2016-08-26 DIAGNOSIS — H510 Palsy (spasm) of conjugate gaze: Secondary | ICD-10-CM

## 2016-08-26 DIAGNOSIS — M4850XG Collapsed vertebra, not elsewhere classified, site unspecified, subsequent encounter for fracture with delayed healing: Secondary | ICD-10-CM | POA: Diagnosis not present

## 2016-08-26 DIAGNOSIS — Z87440 Personal history of urinary (tract) infections: Secondary | ICD-10-CM

## 2016-08-26 DIAGNOSIS — Z882 Allergy status to sulfonamides status: Secondary | ICD-10-CM

## 2016-08-26 DIAGNOSIS — R0902 Hypoxemia: Secondary | ICD-10-CM

## 2016-08-26 DIAGNOSIS — M4658 Other infective spondylopathies, sacral and sacrococcygeal region: Secondary | ICD-10-CM | POA: Diagnosis not present

## 2016-08-26 DIAGNOSIS — I951 Orthostatic hypotension: Secondary | ICD-10-CM | POA: Diagnosis not present

## 2016-08-26 DIAGNOSIS — Z79899 Other long term (current) drug therapy: Secondary | ICD-10-CM

## 2016-08-26 DIAGNOSIS — M60009 Infective myositis, unspecified site: Secondary | ICD-10-CM | POA: Diagnosis present

## 2016-08-26 DIAGNOSIS — M6009 Infective myositis, multiple sites: Secondary | ICD-10-CM | POA: Diagnosis present

## 2016-08-26 DIAGNOSIS — R509 Fever, unspecified: Secondary | ICD-10-CM

## 2016-08-26 DIAGNOSIS — M8619 Other acute osteomyelitis, multiple sites: Secondary | ICD-10-CM

## 2016-08-26 DIAGNOSIS — E878 Other disorders of electrolyte and fluid balance, not elsewhere classified: Secondary | ICD-10-CM | POA: Diagnosis present

## 2016-08-26 DIAGNOSIS — E1143 Type 2 diabetes mellitus with diabetic autonomic (poly)neuropathy: Secondary | ICD-10-CM | POA: Diagnosis present

## 2016-08-26 LAB — PROTIME-INR
INR: 1.24
PROTHROMBIN TIME: 15.6 s — AB (ref 11.4–15.2)

## 2016-08-26 LAB — CBC WITH DIFFERENTIAL/PLATELET
BASOS ABS: 0 10*3/uL (ref 0.0–0.1)
BASOS PCT: 0 %
EOS PCT: 0 %
Eosinophils Absolute: 0 10*3/uL (ref 0.0–0.7)
HCT: 24.6 % — ABNORMAL LOW (ref 39.0–52.0)
HEMOGLOBIN: 7.9 g/dL — AB (ref 13.0–17.0)
LYMPHS ABS: 1.2 10*3/uL (ref 0.7–4.0)
Lymphocytes Relative: 4 %
MCH: 26.7 pg (ref 26.0–34.0)
MCHC: 32.1 g/dL (ref 30.0–36.0)
MCV: 83.1 fL (ref 78.0–100.0)
MONO ABS: 2.2 10*3/uL — AB (ref 0.1–1.0)
MONOS PCT: 7 %
NEUTROS PCT: 89 %
Neutro Abs: 27.4 10*3/uL — ABNORMAL HIGH (ref 1.7–7.7)
Platelets: 503 10*3/uL — ABNORMAL HIGH (ref 150–400)
RBC: 2.96 MIL/uL — ABNORMAL LOW (ref 4.22–5.81)
RDW: 17.6 % — AB (ref 11.5–15.5)
WBC Morphology: INCREASED
WBC: 30.8 10*3/uL — AB (ref 4.0–10.5)

## 2016-08-26 LAB — URINALYSIS, ROUTINE W REFLEX MICROSCOPIC
Bilirubin Urine: NEGATIVE
GLUCOSE, UA: NEGATIVE mg/dL
Ketones, ur: NEGATIVE mg/dL
Nitrite: NEGATIVE
PH: 5 (ref 5.0–8.0)
Protein, ur: 30 mg/dL — AB
SPECIFIC GRAVITY, URINE: 1.02 (ref 1.005–1.030)

## 2016-08-26 LAB — I-STAT CG4 LACTIC ACID, ED: Lactic Acid, Venous: 0.62 mmol/L (ref 0.5–1.9)

## 2016-08-26 LAB — APTT: aPTT: 58 seconds — ABNORMAL HIGH (ref 24–36)

## 2016-08-26 LAB — COMPREHENSIVE METABOLIC PANEL
ALBUMIN: 1.5 g/dL — AB (ref 3.5–5.0)
ALK PHOS: 92 U/L (ref 38–126)
ALT: 10 U/L — AB (ref 17–63)
AST: 14 U/L — ABNORMAL LOW (ref 15–41)
Anion gap: 4 — ABNORMAL LOW (ref 5–15)
BUN: 40 mg/dL — ABNORMAL HIGH (ref 6–20)
CALCIUM: 8.7 mg/dL — AB (ref 8.9–10.3)
CHLORIDE: 99 mmol/L — AB (ref 101–111)
CO2: 20 mmol/L — AB (ref 22–32)
CREATININE: 0.76 mg/dL (ref 0.61–1.24)
GFR calc Af Amer: >60 mL/min (ref >60)
GFR calc non Af Amer: >60 mL/min (ref >60)
GLUCOSE: 125 mg/dL — AB (ref 65–99)
Potassium: 4.2 mmol/L (ref 3.5–5.1)
SODIUM: 123 mmol/L — AB (ref 135–145)
Total Bilirubin: 0.3 mg/dL (ref 0.3–1.2)
Total Protein: 4.9 g/dL — ABNORMAL LOW (ref 6.5–8.1)

## 2016-08-26 LAB — LACTIC ACID, PLASMA
Lactic Acid, Venous: 0.6 mmol/L (ref 0.5–1.9)
Lactic Acid, Venous: 0.8 mmol/L (ref 0.5–1.9)

## 2016-08-26 LAB — POC OCCULT BLOOD, ED: Fecal Occult Bld: POSITIVE — AB

## 2016-08-26 LAB — URINE MICROSCOPIC-ADD ON

## 2016-08-26 LAB — LIPASE, BLOOD: Lipase: 13 U/L (ref 11–51)

## 2016-08-26 LAB — GLUCOSE, CAPILLARY: Glucose-Capillary: 137 mg/dL — ABNORMAL HIGH (ref 65–99)

## 2016-08-26 LAB — PROCALCITONIN: PROCALCITONIN: 1.95 ng/mL

## 2016-08-26 MED ORDER — BISACODYL 5 MG PO TBEC: 5.0000 mg | DELAYED_RELEASE_TABLET | Freq: Every day | ORAL | Status: DC | PRN

## 2016-08-26 MED ORDER — PANTOPRAZOLE SODIUM 20 MG PO TBEC
20.0000 mg | DELAYED_RELEASE_TABLET | Freq: Every day | ORAL | Status: DC | PRN
  Filled 2016-08-26: qty 1

## 2016-08-26 MED ORDER — SODIUM CHLORIDE 0.9 % IV SOLN
INTRAVENOUS | Status: DC
  Administered 2016-08-26: 125 mL/h via INTRAVENOUS
  Administered 2016-08-27: 01:00:00 via INTRAVENOUS

## 2016-08-26 MED ORDER — VANCOMYCIN HCL IN DEXTROSE 1-5 GM/200ML-% IV SOLN: 1000.0000 mg | Freq: Once | INTRAVENOUS | Status: DC

## 2016-08-26 MED ORDER — SODIUM CHLORIDE 0.9 % IV BOLUS (SEPSIS)
250.0000 mL | Freq: Once | INTRAVENOUS | Status: AC
  Administered 2016-08-26: 250 mL via INTRAVENOUS

## 2016-08-26 MED ORDER — ALBUTEROL SULFATE (2.5 MG/3ML) 0.083% IN NEBU
2.5000 mg | INHALATION_SOLUTION | Freq: Four times a day (QID) | RESPIRATORY_TRACT | Status: DC
  Administered 2016-08-26 – 2016-08-31 (×15): 2.5 mg via RESPIRATORY_TRACT
  Filled 2016-08-26 (×15): qty 3

## 2016-08-26 MED ORDER — LORAZEPAM 0.5 MG PO TABS
0.2500 mg | ORAL_TABLET | Freq: Three times a day (TID) | ORAL | Status: DC | PRN
  Filled 2016-08-26: qty 1

## 2016-08-26 MED ORDER — PREGABALIN 100 MG PO CAPS
100.0000 mg | ORAL_CAPSULE | Freq: Two times a day (BID) | ORAL | Status: DC
  Administered 2016-08-26 – 2016-08-28 (×4): 100 mg via ORAL
  Filled 2016-08-26 (×5): qty 1

## 2016-08-26 MED ORDER — PANTOPRAZOLE SODIUM 40 MG PO TBEC
40.0000 mg | DELAYED_RELEASE_TABLET | Freq: Every day | ORAL | Status: DC
  Administered 2016-08-27 – 2016-08-30 (×4): 40 mg via ORAL
  Filled 2016-08-26 (×5): qty 1

## 2016-08-26 MED ORDER — LINEZOLID 600 MG PO TABS
600.0000 mg | ORAL_TABLET | Freq: Two times a day (BID) | ORAL | Status: DC
  Administered 2016-08-26 – 2016-08-27 (×2): 600 mg via ORAL
  Filled 2016-08-26 (×2): qty 1

## 2016-08-26 MED ORDER — DEXTROSE 5 % IV SOLN
1.0000 g | Freq: Three times a day (TID) | INTRAVENOUS | Status: DC
  Filled 2016-08-26 (×2): qty 1

## 2016-08-26 MED ORDER — VANCOMYCIN HCL 500 MG IV SOLR
500.0000 mg | Freq: Two times a day (BID) | INTRAVENOUS | Status: DC
  Filled 2016-08-26: qty 500

## 2016-08-26 MED ORDER — LEVOFLOXACIN IN D5W 750 MG/150ML IV SOLN
750.0000 mg | Freq: Once | INTRAVENOUS | Status: DC
  Filled 2016-08-26: qty 150

## 2016-08-26 MED ORDER — FERROUS SULFATE 325 (65 FE) MG PO TABS
325.0000 mg | ORAL_TABLET | Freq: Every day | ORAL | Status: DC
  Administered 2016-08-27 – 2016-08-28 (×2): 325 mg via ORAL
  Filled 2016-08-26 (×2): qty 1

## 2016-08-26 MED ORDER — SODIUM CHLORIDE 0.9 % IV BOLUS (SEPSIS)
500.0000 mL | Freq: Once | INTRAVENOUS | Status: AC
  Administered 2016-08-26: 500 mL via INTRAVENOUS

## 2016-08-26 MED ORDER — HYDROCORTISONE 20 MG PO TABS
20.0000 mg | ORAL_TABLET | Freq: Every morning | ORAL | Status: DC
  Administered 2016-08-27 – 2016-08-29 (×3): 20 mg via ORAL
  Filled 2016-08-26 (×4): qty 1

## 2016-08-26 MED ORDER — SODIUM CHLORIDE 0.9 % IV BOLUS (SEPSIS)
500.0000 mL | Freq: Once | INTRAVENOUS | Status: AC
Start: 2016-08-26 — End: 2016-08-26
  Administered 2016-08-26: 500 mL via INTRAVENOUS

## 2016-08-26 MED ORDER — HYDROCORTISONE 10 MG PO TABS
10.0000 mg | ORAL_TABLET | Freq: Every evening | ORAL | Status: DC
  Administered 2016-08-26 – 2016-08-29 (×4): 10 mg via ORAL
  Filled 2016-08-26 (×4): qty 1

## 2016-08-26 MED ORDER — DEXTROSE 5 % IV SOLN
2.0000 g | Freq: Once | INTRAVENOUS | Status: DC
  Filled 2016-08-26 (×2): qty 2

## 2016-08-26 MED ORDER — BUPROPION HCL ER (XL) 150 MG PO TB24
150.0000 mg | ORAL_TABLET | Freq: Every day | ORAL | Status: DC
  Administered 2016-08-28: 150 mg via ORAL
  Filled 2016-08-26 (×3): qty 1

## 2016-08-26 MED ORDER — OXYBUTYNIN CHLORIDE 5 MG PO TABS
5.0000 mg | ORAL_TABLET | Freq: Two times a day (BID) | ORAL | Status: DC
  Administered 2016-08-26 – 2016-10-05 (×76): 5 mg
  Filled 2016-08-26 (×82): qty 1

## 2016-08-26 MED ORDER — OMEPRAZOLE-SODIUM BICARBONATE 20-1100 MG PO CAPS: 1.0000 | ORAL_CAPSULE | ORAL | Status: DC | PRN

## 2016-08-26 MED ORDER — LEVOFLOXACIN IN D5W 750 MG/150ML IV SOLN: 750.0000 mg | INTRAVENOUS | Status: DC

## 2016-08-26 MED ORDER — FENTANYL 75 MCG/HR TD PT72
75.0000 ug | MEDICATED_PATCH | TRANSDERMAL | Status: DC
  Administered 2016-08-27: 75 ug via TRANSDERMAL
  Filled 2016-08-26: qty 1

## 2016-08-26 MED ORDER — BACLOFEN 20 MG PO TABS
20.0000 mg | ORAL_TABLET | Freq: Four times a day (QID) | ORAL | Status: DC
  Administered 2016-08-26 – 2016-08-28 (×9): 20 mg via ORAL
  Filled 2016-08-26 (×9): qty 1

## 2016-08-26 MED ORDER — IOPAMIDOL (ISOVUE-300) INJECTION 61%
INTRAVENOUS | Status: AC
  Administered 2016-08-26: 100 mL
  Filled 2016-08-26: qty 100

## 2016-08-26 MED ORDER — INSULIN ASPART 100 UNIT/ML ~~LOC~~ SOLN
0.0000 [IU] | Freq: Three times a day (TID) | SUBCUTANEOUS | Status: DC
Start: 2016-08-26 — End: 2016-08-29
  Administered 2016-08-28: 5 [IU] via SUBCUTANEOUS
  Administered 2016-08-28: 3 [IU] via SUBCUTANEOUS
  Administered 2016-08-28: 2 [IU] via SUBCUTANEOUS
  Administered 2016-08-29: 5 [IU] via SUBCUTANEOUS
  Administered 2016-08-29: 3 [IU] via SUBCUTANEOUS

## 2016-08-26 MED ORDER — SODIUM CHLORIDE 0.9 % IV BOLUS (SEPSIS)
1000.0000 mL | Freq: Once | INTRAVENOUS | Status: AC
  Administered 2016-08-26: 1000 mL via INTRAVENOUS

## 2016-08-26 MED ORDER — TRAZODONE HCL 50 MG PO TABS: 25.0000 mg | ORAL_TABLET | Freq: Every evening | ORAL | Status: DC | PRN

## 2016-08-26 NOTE — Progress Notes (Signed)
Pharmacy Antibiotic Note  Jason [name] is a 30 y.o. male admitted on 08/25/2016 with sepsis.  Pharmacy has been consulted for levaquin, aztreonam and vancomycin dosing. 30 y.o. 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  Recent admission on 08/09/16 for aspiration PNA of RLL - he was treated with empiric aztreonam. Has multiple abx allergies and on last admission was given cefepime with solumedrol 20 mg premedication.  Wt 54.9 kg,  WBC 30.8, ANC 27.4, creat 0.75 (pt w/ functional quadriplegia) , lactate WNL. AF.   Plan: vancomcyin 1 gm IV x1 followed byVancomycin 500 IV every 12 hours.  Goal trough 15-20 mcg/mL.  levaquin 750 mg IV q24 Aztreoam 2 gm in ED then 1 gm q8h F/u renal fxn, WBC, temp, culture data Vancomycin level as needed  Height: 5\' 8"  (172.7 cm) Weight: 121 lb (54.9 kg) IBW/kg (Calculated) : 68.4  Temp (24hrs), Avg:97.7 F (36.5 C), Min:97.7 F (36.5 C), Max:97.7 F (36.5 C)   Recent Labs Lab 08/13/2016 1116 08/18/2016 1127  WBC 30.8*  --   CREATININE 0.76  --   LATICACIDVEN  --  0.62    Estimated Creatinine Clearance: 102.9 mL/min (by C-G formula based on SCr of 0.76 mg/dL).    Allergies  Allergen Reactions  . Amikacin Sulfate Other (See Comments)    Seizure   . Cefuroxime Axetil Anaphylaxis    Tolerated cefepime 12/2015 - with Pepcid/Solu-Medrol  . Ertapenem Other (See Comments)    Rash and confusion-->tolerated Imipenem   . Morphine And Related Other (See Comments)    Changed mental status, confusion, headache, visual hallucination  . Nsaids Itching and Other (See Comments)    Risk of bleeding, itching  . Penicillins Anaphylaxis and Other (See Comments)    Tolerated Imipenem; no reaction to 7 day course of amoxicillin in 2015 Has patient had a PCN reaction causing immediate rash, facial/tongue/throat swelling, SOB  or lightheadedness with hypotension: Yes Has patient had a PCN reaction causing severe rash involving mucus membranes or skin necrosis: No Has patient had a PCN reaction that required hospitalization Yes Has patient had a PCN reaction occurring within the last 10 years: No If all of the above answers are "NO", then may proceed with Cephalo  . Sulfa Antibiotics Anaphylaxis, Shortness Of Breath and Other (See Comments)  . Tessalon [Benzonatate] Anaphylaxis  . Levaquin [Levofloxacin In D5w] Swelling    Hand and forearm swelling  . Rocephin [Ceftriaxone] Other (See Comments)    Confusion  . Shellfish Allergy Itching and Other (See Comments)    Took benadryl to alleviate reaction  . Miripirium Rash and Other (See Comments)    Change in mental status   Eudelia Bunch, Pharm.D. BP:[id number] 08/31/2016 1:25 PM

## 2016-08-26 NOTE — ED Notes (Signed)
Requested air mattress for patient.

## 2016-08-26 NOTE — ED Triage Notes (Signed)
Per EMS, patient is from home. Dad was changing patient's colostomy bag and found blood in bag.  En route, seen only 1cc of blood.  No recent trauma to abdomen.  Hx of blood transfusions with unknown reason for blood loss.  Patient admitted a little over a week ago for a blood transfusion and ended up aspirating and getting pneumonia.  Pt has had trach since August 2016.  BP 87/54, HR 76, 93% RA, 100% on O2 at 6L on simple mask, CBG 174, RR 18.  Mother states that his BP are normally low.  A/O x 4.  Very lethargic/tired.  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  G-Tube also leaking and mom would like to know why.  Suprapubic catheter.  Urine output has decreased in last three days.  PICC line on left placed over 1 week ago.

## 2016-08-26 NOTE — Progress Notes (Signed)
Pt arrived to unit. Skin assessment completed with Pecolia Ades RN. Mid Upper Back: 4x2 Stage II dressed with pink foam L Buttocks: 5.5x6.5x2 Stage IV dressed with wet to dry Kerlex, ABD, Hypofix R Buttocks: 8x5.5x2.5 Stage IV Tunneling at 1400 dressed with wet to dry Kerlex, ABD, Hypofix Sacral: 12x8x2 1900-2000 Tunneling 1.5 deep, Tunneling 2 deep at Eschar, Unstageable dressed with wet to dry Kerlex, ABD, Hypofix L Inner Thigh 3x2 Stage II dressed with pink foam R Elbow Stage II dressed with Pink Foam L Foot (Anterior) Healed Pressure Ulcer L Ankle (Anterior) 3x1.5 Stage II Dressed with Pink Foam L Heel 3x2 Unstageable Dressed with pink foam Multiple Bilat Lower Extremity Circumfrential old healed pressure ulcers.

## 2016-08-26 NOTE — ED Provider Notes (Signed)
Emergency Department Provider Note   I have reviewed the triage vital signs and the nursing notes.   HISTORY  Chief Complaint Blood in colostomy bag   HPI Jason [name] is a 30 y.o. 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 Mom at bedside states that he had large volume watery diarrhea over the last 36 hours. She states this morning his father was changing his colostomy bag because it ruptured and noted a large amount of bright red blood on the Chux pad and also in the colostomy bag. It was mixed with brown stool. No black, tarry material. Mom denies any fevers or chills. She states that his G-tube continues to have leaking around the stoma but no bleeding. No bleeding from the tracheostomy site, worsening difficulty breathing, or increased respiratory secretions. No fevers. He recently completed an antibiotic course for pneumonia.     Past Medical History:  Diagnosis Date  . Anemia   . Anxiety   . Arthritis   . Asthma   . Chronic osteomyelitis involving pelvic region and thigh ([place]) 06/18/2016  . Colostomy in place [facility])   . Complication of anesthesia    difficulty waking up, will have sudden drop in BP and O2 sats  . Depression   . Diabetic neuropathy ([place])   . Diabetic retinopathy ([place])   . Dysrhythmia    usually due to medications  . Family history of anesthesia complication    Pt mother can't have epidural procedures  . Fibromyalgia   . Gastroparesis   . GERD (gastroesophageal reflux disease)   . H/O constipation   . H/O respiratory failure   . H/O thrombosis   . Hx MRSA infection    on face  . Multiple allergies 11/02/2015  . Pneumonia   . Recurrent UTI 11/02/2015  . Seizures ([place]) 2017   one time during admission in hosptital  . Stroke ([place]) 01/29/2013   spinal stroke ;  paraplegic   . Syncope 02/16/2015  . Tracheostomy present ([place])   . Type I diabetes mellitus ([place])    sees Dr. Loanne Drilling     Patient Active Problem List   Diagnosis Date Noted  . Hyponatremia 08/27/2016  . PEG tube malfunction ([place])   . Adrenal insufficiency ([place]) 07/24/2016  . GERD (gastroesophageal reflux disease) 07/24/2016  . Anemia of chronic disease   . Diabetes mellitus type 1, uncontrolled, with complications ([place])   . Ventilator dependence ([place])   . Palliative care encounter   . Seizures ([place])   . Anxiety state   . Aspiration pneumonia of right lower lobe ([place])   . Fistula   . Respiratory failure, chronic ([place]) 12/06/2015  . Multiple allergies 11/02/2015  . Recurrent UTI 11/02/2015  . AKI (acute kidney injury) ([place])   . Decubitus ulcer of right perineal ischial region, stage 4 ([place]) 07/15/2015  . Decubitus ulcer of left perineal ischial region, stage 4 ([place]) 07/15/2015  . Sacral decubitus ulcer, stage IV ([place]) 07/15/2015  . Dysphagia, pharyngoesophageal phase 07/04/2015  . Severe protein-calorie malnutrition ([place])   . DVT (deep venous thrombosis) ([place])   . History of pulmonary embolism   . 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([place])   . 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 ([place])   . Suprapubic catheter ([place])   . Neurogenic bladder 04/19/2014  .  Altered mental status 01/30/2014  . Gastroparesis due to DM ([place]) 06/02/2013    Past Surgical History:  Procedure Laterality Date  . APPLICATION OF A-CELL OF BACK N/A 11/12/2014   Procedure: APPLICATION A CELL AND VAC ;  Surgeon: Theodoro Kos, DO;  Location: [place];  Service: Plastics;  Laterality: N/A;  . APPLICATION OF A-CELL OF BACK N/A 12/08/2014   Procedure: APPLICATION OF A-CELL AND WOUND VAC ;  Surgeon: Theodoro Kos, DO;  Location: [place];  Service: Plastics;  Laterality: N/A;  . APPLICATION OF A-CELL OF CHEST/ABDOMEN N/A 08/04/2015   Procedure: APPLICATION OF A-CELL ;  Surgeon: Loel Lofty Dillingham, DO;  Location:  [place];  Service: Plastics;  Laterality: N/A;  . COLOSTOMY    . DEBRIDMENT OF DECUBITUS ULCER N/A 10/04/2014   Procedure: DEBRIDMENT OF DECUBITUS ULCER;  Surgeon: Georganna Skeans, MD;  Location: [place];  Service: General;  Laterality: N/A;  . GASTROSTOMY TUBE PLACEMENT    . HEMATOMA EVACUATION N/A 05/05/2015   Procedure: EVACUATION HEMATOMA bedside procedure;  Surgeon: Theodoro Kos, DO;  Location: [place];  Service: Plastics;  Laterality: N/A;  . INCISION AND DRAINAGE OF WOUND N/A 11/12/2014   Procedure: IRRIGATION AND DEBRIDEMENT OF WOUNDS WITH BONE BIOPSY AND SURGICAL PREP ;  Surgeon: Theodoro Kos, DO;  Location: [place];  Service: Plastics;  Laterality: N/A;  . INCISION AND DRAINAGE OF WOUND N/A 11/18/2014   Procedure: IRRIGATION AND DEBRIDEMENT OF SACRAL ULCER ONLY WITH PLACEMENT OF A CELL AND VAC/ DRESSING CHANGE TO UPPER BACK AREA.;  Surgeon: Theodoro Kos, DO;  Location: [place];  Service: Plastics;  Laterality: N/A;  . INCISION AND DRAINAGE OF WOUND N/A 11/25/2014   Procedure: IRRIGATION AND DEBRIDEMENT OF SACRAL ULCER AND BACK BURN WITH PLACEMENT OF A-CELL;  Surgeon: Theodoro Kos, DO;  Location: [place];  Service: Plastics;  Laterality: N/A;  . INCISION AND DRAINAGE OF WOUND Right 12/08/2014   Procedure: IRRIGATION AND DEBRIDEMENT SACRAL WOUND AND RIGHT ISCHIAL WOUND ;  Surgeon: Theodoro Kos, DO;  Location: [place];  Service: Plastics;  Laterality: Right;  . INCISION AND DRAINAGE OF WOUND Bilateral 05/05/2015   Procedure: IRRIGATION AND DEBRIDEMENT SACRAL ULCER;  Surgeon: Theodoro Kos, DO;  Location: [place];  Service: Plastics;  Laterality: Bilateral;  . INCISION AND DRAINAGE OF WOUND N/A 08/04/2015   Procedure: IRRIGATION AND DEBRIDEMENT OF SACRAL ULCER AND ;  Surgeon: Loel Lofty Dillingham, DO;  Location: [place];  Service: Plastics;  Laterality: N/A;  . INSERTION OF SUPRAPUBIC CATHETER N/A 10/12/2014   Procedure: INSERTION OF SUPRAPUBIC CATHETER;  Surgeon: Reece Packer, MD;  Location: [place];  Service:  Urology;  Laterality: N/A;  . IR GENERIC HISTORICAL  07/11/2016   IR CM INJ ANY COLONIC TUBE W/FLUORO 07/11/2016 Arne Cleveland, MD [facility]-INTERV RAD  . LAPAROSCOPIC DIVERTED COLOSTOMY N/A 10/12/2014   Procedure: LAPAROSCOPIC DIVERTING COLOSTOMY;  Surgeon: Donnie Mesa, MD;  Location: [place];  Service: General;  Laterality: N/A;  . MINOR APPLICATION OF WOUND VAC N/A 11/25/2014   Procedure:  WOUND VAC CHANGE;  Surgeon: Theodoro Kos, DO;  Location: [place];  Service: Plastics;  Laterality: N/A;  . MULTIPLE EXTRACTIONS WITH ALVEOLOPLASTY N/A 08/03/2014   Procedure: MULTIPLE EXTRACTIONS;  Surgeon: Gae Bon, DDS;  Location: [place];  Service: Oral Surgery;  Laterality: N/A;  . RADIOLOGY WITH ANESTHESIA N/A 07/05/2016   Procedure: RADIOLOGY WITH ANESTHESIA MRI OF PELVIS WITH AND WITHOUT;  Surgeon: Medication Radiologist, MD;  Location: [place];  Service: Radiology;  Laterality: N/A;  .  TEE WITHOUT CARDIOVERSION N/A 08/17/2014   Procedure: TRANSESOPHAGEAL ECHOCARDIOGRAM (TEE);  Surgeon: Dorothy Spark, MD;  Location: [place];  Service: Cardiovascular;  Laterality: N/A;  . TEE WITHOUT CARDIOVERSION N/A 05/05/2015   Procedure: TRANSESOPHAGEAL ECHOCARDIOGRAM (TEE);  Surgeon: Lelon Perla, MD;  Location[facility] ENDOSCOPY;  Service: Cardiovascular;  Laterality: N/A;  . TONSILLECTOMY        Allergies Amikacin sulfate; Cefuroxime axetil; Ertapenem; Morphine and related; Nsaids; Penicillins; Sulfa antibiotics; Tessalon [benzonatate]; Levaquin [levofloxacin in d5w]; Rocephin [ceftriaxone]; Shellfish allergy; and Miripirium  Family History  Problem Relation Age of Onset  . Diabetes Father   . Hypertension Father   . Heart disease Mother     congenital - MVP  . Asthma      fhx  . Hypertension      fhx  . Stroke      fhx    Social History Social History  Substance Use Topics  . Smoking status: Former Smoker    Packs/day: 1.00    Years: 12.00    Types: Cigars, Cigarettes    Quit date: 09/07/2014   . Smokeless tobacco: Never Used  . Alcohol use No    Review of Systems  Level 5 caveat. Patient is somnolent with only intermittent alertness and with trach which limits ROS.   ____________________________________________   PHYSICAL EXAM:  VITAL SIGNS: ED Triage Vitals  Enc Vitals Group     BP 08/16/2016 1024 (!) 86/63     Pulse Rate 09/02/2016 1024 76     Resp 08/20/2016 1024 13     Temp 08/19/2016 1024 97.7 F (36.5 C)     Temp Source 08/23/2016 1024 Oral     SpO2 08/22/2016 1024 97 %     Weight 08/12/2016 1025 121 lb (54.9 kg)     Height 08/10/2016 1025 5\' 8"  (1.727 m)    Constitutional: Somnolent but arouses to loud voice. Chronically ill appearing and thin.  Eyes: Conjunctivae are normal. PERRL.  Head: Atraumatic. Nose: No congestion/rhinnorhea. Mouth/Throat: Mucous membranes are very dry. Oropharynx non-erythematous. Neck: No stridor.  Trach in place with no surrounding bleeding. No significant secretions.  Cardiovascular: Normal rate, regular rhythm. Good peripheral circulation. Grossly normal heart sounds.   Respiratory: Normal respiratory effort.  No retractions. Lungs CTAB. Gastrointestinal: Soft and nontender. Colostomy in LLQ with watery maroon material. No distention.  Musculoskeletal: No lower extremity edema. No gross deformity.  Neurologic: Baseline contracted bilateral LEs. No grip strength in B/L UE but 4+/5 strength in B/L deltoids, triceps, and biceps.  Skin:  Skin is warm, dry and intact. Large sacral decubitus ulcer.  {  ____________________________________________   LABS (all labs ordered are listed, but only abnormal results are displayed)  Labs Reviewed  MRSA PCR SCREENING - Abnormal; Notable for the following:       Result Value   MRSA by PCR POSITIVE (*)    All other components within normal limits  COMPREHENSIVE METABOLIC PANEL - Abnormal; Notable for the following:    Sodium 123 (*)    Chloride 99 (*)    CO2 20 (*)    Glucose, Bld 125 (*)    BUN  40 (*)    Calcium 8.7 (*)    Total Protein 4.9 (*)    Albumin 1.5 (*)    AST 14 (*)    ALT 10 (*)    Anion gap 4 (*)    All other components within normal limits  CBC WITH DIFFERENTIAL/PLATELET - Abnormal; Notable for the following:  WBC 30.8 (*)    RBC 2.96 (*)    Hemoglobin 7.9 (*)    HCT 24.6 (*)    RDW 17.6 (*)    Platelets 503 (*)    Neutro Abs 27.4 (*)    Monocytes Absolute 2.2 (*)    All other components within normal limits  URINALYSIS, ROUTINE W REFLEX MICROSCOPIC (NOT AT [facility]) - Abnormal; Notable for the following:    APPearance CLOUDY (*)    Hgb urine dipstick LARGE (*)    Protein, ur 30 (*)    Leukocytes, UA MODERATE (*)    All other components within normal limits  URINE MICROSCOPIC-ADD ON - Abnormal; Notable for the following:    Squamous Epithelial / LPF 0-5 (*)    Bacteria, UA RARE (*)    All other components within normal limits  PROTIME-INR - Abnormal; Notable for the following:    Prothrombin Time 15.6 (*)    All other components within normal limits  APTT - Abnormal; Notable for the following:    aPTT 58 (*)    All other components within normal limits  GLUCOSE, CAPILLARY - Abnormal; Notable for the following:    Glucose-Capillary 137 (*)    All other components within normal limits  CBC WITH DIFFERENTIAL/PLATELET - Abnormal; Notable for the following:    WBC 29.1 (*)    RBC 2.65 (*)    Hemoglobin 7.0 (*)    HCT 22.0 (*)    RDW 17.5 (*)    All other components within normal limits  COMPREHENSIVE METABOLIC PANEL - Abnormal; Notable for the following:    Sodium 127 (*)    CO2 20 (*)    Glucose, Bld 22 (*)    BUN 40 (*)    Calcium 8.4 (*)    Total Protein 4.6 (*)    Albumin 1.4 (*)    ALT 11 (*)    Total Bilirubin 0.2 (*)    Anion gap 3 (*)    All other components within normal limits  PHOSPHORUS - Abnormal; Notable for the following:    Phosphorus <1.0 (*)    All other components within normal limits  GLUCOSE, CAPILLARY - Abnormal;  Notable for the following:    Glucose-Capillary 26 (*)    All other components within normal limits  GLUCOSE, CAPILLARY - Abnormal; Notable for the following:    Glucose-Capillary 26 (*)    All other components within normal limits  GLUCOSE, CAPILLARY - Abnormal; Notable for the following:    Glucose-Capillary 120 (*)    All other components within normal limits  POC OCCULT BLOOD, ED - Abnormal; Notable for the following:    Fecal Occult Bld POSITIVE (*)    All other components within normal limits  C DIFFICILE QUICK SCREEN W PCR REFLEX  CULTURE, BLOOD (ROUTINE X 2)  URINE CULTURE  CULTURE, BLOOD (ROUTINE X 2)  CULTURE, BLOOD (ROUTINE X 2)  CULTURE, EXPECTORATED SPUTUM-ASSESSMENT  LIPASE, BLOOD  LACTIC ACID, PLASMA  LACTIC ACID, PLASMA  PROCALCITONIN  MAGNESIUM  I-STAT CG4 LACTIC ACID, ED  TYPE AND SCREEN  PREPARE RBC (CROSSMATCH)   ____________________________________________  RADIOLOGY  Ct Abdomen Pelvis W Contrast  Result Date: 09/03/2016 CLINICAL DATA:  Shortness of Breath.  Blood in colostomy bag. EXAM: CT ABDOMEN AND PELVIS WITH CONTRAST TECHNIQUE: Multidetector CT imaging of the abdomen and pelvis was performed using the standard protocol following bolus administration of intravenous contrast. CONTRAST:  100 cc Isovue-300 IV COMPARISON:  12/10/2015 FINDINGS: Lower chest: The distal  esophagus is filled with debris. Small bilateral pleural effusions. Dense consolidation in the lower lobes bilaterally, similar to prior study. Heart is normal size. Hepatobiliary: No focal hepatic abnormality. Gallbladder unremarkable. Pancreas: No focal abnormality or ductal dilatation. Spleen: No focal abnormality.  Normal size. Adrenals/Urinary Tract: No adrenal abnormality. No focal renal abnormality. No stones or hydronephrosis. Urinary bladder is decompressed with mild wall thickening. Suprapubic catheter in place. Stomach/Bowel: Left lower quadrant ostomy noted. No evidence of bowel  obstruction. Gastrostomy tube is in place within the stomach. Moderate debris/material within the stomach. Vascular/Lymphatic: No evidence of aneurysm in the left periaortic region adjacent to the L2 level, there is irregular mixed density necrotic adenopathy versus periaortic abscess measuring 2.7 x 2.0 cm. Areas of high density noted within this periaortic process is well as the right psoas muscle of unknown etiology, possible calcifications from chronic process. IVC filter in place within the infrarenal IVC. Reproductive: No visible focal abnormality. Other: Small amount of free fluid adjacent to the decompressed urinary bladder. No free air. Musculoskeletal: Probable large decubitus ulcer noted over the sacrum and right posterior iliac bone. Underlying sclerosis within the right posterior iliac bone is increased since prior study and may reflect chronic osteomyelitis. There is gas and fluid noted within the adjacent right SI joint. This is new since prior study. This is most compatible with septic arthritis. The adjacent iliac bone demonstrates irregular lucency and bony destruction compatible with adjacent osteomyelitis. Fluid and gas extend into the right iliacus muscle as well as the iliopsoas and in the right psoas muscle superiorly compatible with intramuscular abscesses. Numerous abnormal vertebral bodies. Near complete destruction of the L2 vertebral body. Areas of lucency within L4, L1, T8 most compatible with multilevel osteomyelitis changes. Partial collapse and sclerosis in the L5 vertebral body, likely related to chronic osteomyelitis. 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 Evidence of septic arthritis within the right SI joint and acute osteomyelitis in the adjacent right iliac bone. Changes of multi level osteomyelitis throughout the vertebral bodies T8, L1, L2, L4, and L5. Intramuscular  abscesses involving the psoas, iliopsoas and iliacus muscles on the right. Mixed density complex process in the left periaortic space adjacent to the L2 vertebral body which could reflect necrotic and partially calcified adenopathy or periaortic abscess. Gastrostomy tube in expected position. Small bilateral pleural effusions. Dense consolidation in the lower lobes is stable since prior study and most compatible with pneumonia. Debris/ fluid filled dilated distal esophagus and stomach. These results were called by telephone at the time of interpretation on 08/21/2016 at 2:25 pm to Dr. Nanda Quinton , who verbally acknowledged these results. Electronically Signed   By: Rolm Baptise M.D.   On: 08/18/2016 14:29   Dg Chest Portable 1 View  Result Date: 08/29/2016 CLINICAL DATA:  Altered mental status. EXAM: PORTABLE CHEST 1 VIEW COMPARISON:  08/13/2016 FINDINGS: Tracheostomy tube and right PICC line remain in place, unchanged. There is mild cardiomegaly. Bilateral lower lobe airspace opacities, similar on the right prior study, increased on the left. No visible effusions. No acute bony abnormality. IMPRESSION: Bibasilar airspace opacities concerning for pneumonia, increasing on the left since prior study. Electronically Signed   By: Rolm Baptise M.D.   On: 08/25/2016 12:35    ____________________________________________   PROCEDURES  Procedure(s) performed:   Procedures  CRITICAL CARE Performed by: Margette Fast Total critical care time: 45 minutes Critical care time was exclusive  of separately billable procedures and treating other patients. Critical care was necessary to treat or prevent imminent or life-threatening deterioration. Critical care was time spent personally by me on the following activities: development of treatment plan with patient and/or surrogate as well as nursing, discussions with consultants, evaluation of patient's response to treatment, examination of patient, obtaining  history from patient or surrogate, ordering and performing treatments and interventions, ordering and review of laboratory studies, ordering and review of radiographic studies, pulse oximetry and re-evaluation of patient's condition.  Nanda Quinton, MD Emergency Medicine  ____________________________________________   INITIAL IMPRESSION / ASSESSMENT AND PLAN / ED COURSE  Pertinent labs & imaging results that were available during my care of the patient were reviewed by me and considered in my medical decision making (see chart for details).  Patient is a chronically ill 30 year old with tracheostomy, colostomy, and G-tube secondary to spinal infarct. He has a large sacral decubitus ulcer and presents today with bleeding into his colostomy bag. Low suspicion for upper GI bleed as the material is described as bright red. There is a small amount of watery red material in his colostomy bag. No gross blood at this time. Plan for labs, Hemoccult testing, and C. difficile screen given recent antibiotics and large volume watery stool in the last 36 hours. Abdomen is soft. No respiratory symptoms or abnormalities on lung exam.  01:00 PM Patient with evidence of UTI on UA. Also with hyponatremia which may be contributing to drowsiness. The patient's baseline BP is in the 80s per mom at bedside so the patient is slightly lower than normal. I do have some concern for c. Diff but with lower BP than normal, leukocytosis, and now a UTI I plan to begin abx at this time.   01:15 PM Antibiotics and other sepsis treatment significantly delayed due to mother's refusal of this treatment. CT scan pending.   02:34 PM Discussed CT scan results with mother at bedside. She continues to refuse antibiotic treatment. She is not concerned about his low sodium stating that they gave fluids yesterday and this is probably the reason for his low sodium. She notes that he is more sleepy than normal. She is now telling me that  blood pressures in the 70s are normal for him. Plan to discuss with ID on call. I recommended admission on multiple occasions during the afternoon but the patient's mother refused.   Discussed case with Dr. Linus Salmons by phone. Advises started Linezolid per tube Q12. Had long discussion with mom regarding the patient's new areas on infection on CT and electrolyte abnormalities. Involved patient in discussion regarding admission. They are in agreement. We were able to discuss that their goals are to return home ASAP to enjoy time with family.   Discussed patient's case with hospitalist, Dr. Nehemiah Settle.  Recommend admission to inpatient, stepdown bed.  I will place holding orders per their request. Patient and family (if present) updated with plan. Care transferred to hospitlaist service.  I reviewed all nursing notes, vitals, pertinent old records, EKGs, labs, imaging (as available).  ____________________________________________  FINAL CLINICAL IMPRESSION(S) / ED DIAGNOSES  Final diagnoses:  Hyponatremia  Other acute osteomyelitis, multiple sites ([place])  Sepsis, due to unspecified organism [facility])     MEDICATIONS GIVEN DURING THIS VISIT:  Medications  linezolid (ZYVOX) tablet 600 mg (600 mg Oral Given 08/27/16 0930)  insulin aspart (novoLOG) injection 0-9 Units (0 Units Subcutaneous Not Given 08/27/16 0800)  oxybutynin (DITROPAN) tablet 5 mg (5 mg Per Tube Given 08/27/16  0930)  fentaNYL (DURAGESIC - dosed mcg/hr) patch 75 mcg (75 mcg Transdermal Patch Applied 08/27/16 0037)  hydrocortisone (CORTEF) tablet 10 mg (10 mg Oral Given 08/10/2016 2241)  hydrocortisone (CORTEF) tablet 20 mg (20 mg Oral Given 08/27/16 0930)  albuterol (PROVENTIL) (2.5 MG/3ML) 0.083% nebulizer solution 2.5 mg (2.5 mg Nebulization Given 08/27/16 0751)  baclofen (LIORESAL) tablet 20 mg (20 mg Oral Given 08/27/16 0930)  LORazepam (ATIVAN) tablet 0.25-0.5 mg (not administered)  bisacodyl (DULCOLAX) EC tablet 5 mg (not  administered)  buPROPion (WELLBUTRIN XL) 24 hr tablet 150 mg (150 mg Oral Not Given 08/27/16 1000)  pregabalin (LYRICA) capsule 100 mg (100 mg Oral Not Given 08/27/16 0930)  pantoprazole (PROTONIX) EC tablet 40 mg (40 mg Oral Given 08/27/16 0930)  ferrous sulfate tablet 325 mg (325 mg Oral Given 08/27/16 0824)  traZODone (DESYREL) tablet 25 mg (not administered)  mupirocin ointment (BACTROBAN) 2 % 1 application (1 application Nasal Given 08/27/16 0930)  Chlorhexidine Gluconate Cloth 2 % PADS 6 each (6 each Topical Given 08/27/16 0715)  dextrose 5 % and 0.45 % NaCl with KCl 10 mEq/L infusion ( Intravenous New Bag/Given 08/27/16 1016)  midodrine (PROAMATINE) tablet 20 mg (20 mg Oral Given 08/27/16 1016)  0.9 %  sodium chloride infusion (not administered)  potassium phosphate 40 mEq in dextrose 5 % 500 mL infusion (not administered)  sodium chloride 0.9 % bolus 500 mL (0 mLs Intravenous Stopped 08/25/2016 1225)  sodium chloride 0.9 % bolus 1,000 mL (0 mLs Intravenous Stopped 09/05/2016 1630)    And  sodium chloride 0.9 % bolus 500 mL (0 mLs Intravenous Stopped 09/05/2016 1400)    And  sodium chloride 0.9 % bolus 250 mL (0 mLs Intravenous Stopped 08/23/2016 1430)  iopamidol (ISOVUE-300) 61 % injection (100 mLs  Contrast Given 08/14/2016 1330)  dextrose 50 % solution (  Given 08/27/16 0915)  sodium chloride 0.9 % bolus 1,000 mL (1,000 mLs Intravenous Given 08/27/16 0929)     NEW OUTPATIENT MEDICATIONS STARTED DURING THIS VISIT:  None   Note:  This document was prepared using Dragon voice recognition software and may include unintentional dictation errors.  Nanda Quinton, MD Emergency Medicine   Margette Fast, MD 08/27/16 [phone]

## 2016-08-26 NOTE — H&P (Signed)
History and Physical    Jason [name] [id number] DOB: 08/06/1986 DOA: 08/14/2016   PCP: [name], MD   Patient coming from:  Home  Chief Complaint: Diarrhea, Blood in stools  HPI: Jason [name] is a 30 y.o. 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 No apparent trauma to the abdomen recently. He did receive transfusions in the past, last one week ago during last admission. In addition, the patient has been increasingly lethargic, weighted decrease urine output over the last 3 days. Mother reports that the patient had large volume of watery diarrhea over the last  last 36 hours. No apparent fever or chills. There is no apparent bleeding from the tract site, or worsening breathing or increased respiratory secretions.  ED Course:  BP (!) 77/54   Pulse 69   Temp 97.7 F (36.5 C) (Oral)   Resp 16   Ht 5\' 8"  (1.727 m)   Wt 54.9 kg (121 lb)   SpO2 100%   BMI 18.40 kg/m    White count 30.8 creatinine 0.75 lactate normal  UA with moderate leukocytes Hemoccult is positive  PLaced on  levaquin, aztreonam and vancomycin per Pharmacy   Review of Systems: As per HPI otherwise 10 point review of systems negative.   Past Medical History:  Diagnosis Date  . Anemia   . Anxiety   . Arthritis   . Asthma   . Chronic osteomyelitis involving pelvic region and thigh ([place]) 06/18/2016  . Colostomy in place [facility])   . Complication of anesthesia    difficulty waking up, will have sudden drop in BP and O2 sats  . Depression   . Diabetic neuropathy ([place])   . Diabetic retinopathy ([place])   . Dysrhythmia    usually due to medications  . Family history of anesthesia complication    Pt mother can't have  epidural procedures  . Fibromyalgia   . Gastroparesis   . GERD (gastroesophageal reflux disease)   . H/O constipation   . H/O respiratory failure   . H/O thrombosis   . Hx MRSA infection    on face  . Multiple allergies 11/02/2015  . Pneumonia   . Recurrent UTI 11/02/2015  . Seizures ([place]) 2017   one time during admission in hosptital  . Stroke ([place]) 01/29/2013   spinal stroke ; paraplegic   . Syncope 02/16/2015  . Tracheostomy present ([place])   . Type I diabetes mellitus ([place])    sees Dr. Loanne Drilling     Past Surgical History:  Procedure Laterality Date  . APPLICATION OF A-CELL OF BACK N/A 11/12/2014   Procedure: APPLICATION A CELL AND VAC ;  Surgeon: Theodoro Kos, DO;  Location: [place];  Service: Plastics;  Laterality: N/A;  . APPLICATION OF A-CELL OF BACK N/A 12/08/2014   Procedure: APPLICATION OF A-CELL AND WOUND VAC ;  Surgeon: Theodoro Kos, DO;  Location: [place];  Service: Plastics;  Laterality: N/A;  . APPLICATION OF A-CELL OF CHEST/ABDOMEN N/A 08/04/2015   Procedure: APPLICATION OF A-CELL ;  Surgeon: Loel Lofty Dillingham, DO;  Location: [place];  Service: Plastics;  Laterality: N/A;  . COLOSTOMY    . DEBRIDMENT OF DECUBITUS ULCER N/A 10/04/2014   Procedure: DEBRIDMENT OF DECUBITUS ULCER;  Surgeon: Lavone Neri  [place] Silos, MD;  Location: [place];  Service: General;  Laterality: N/A;  . GASTROSTOMY TUBE PLACEMENT    . HEMATOMA EVACUATION N/A 05/05/2015   Procedure: EVACUATION HEMATOMA bedside procedure;  Surgeon: Theodoro Kos, DO;  Location: [place];  Service: Plastics;  Laterality: N/A;  . INCISION AND DRAINAGE OF WOUND N/A 11/12/2014   Procedure: IRRIGATION AND DEBRIDEMENT OF WOUNDS WITH BONE BIOPSY AND SURGICAL PREP ;  Surgeon: Theodoro Kos, DO;  Location: [place];  Service: Plastics;  Laterality: N/A;  . INCISION AND DRAINAGE OF WOUND N/A 11/18/2014   Procedure: IRRIGATION AND DEBRIDEMENT OF SACRAL ULCER ONLY WITH PLACEMENT OF A CELL AND VAC/ DRESSING CHANGE TO UPPER BACK AREA.;  Surgeon: Theodoro Kos, DO;  Location: [place];  Service: Plastics;  Laterality: N/A;  . INCISION AND DRAINAGE OF WOUND N/A 11/25/2014   Procedure: IRRIGATION AND DEBRIDEMENT OF SACRAL ULCER AND BACK BURN WITH PLACEMENT OF A-CELL;  Surgeon: Theodoro Kos, DO;  Location: [place];  Service: Plastics;  Laterality: N/A;  . INCISION AND DRAINAGE OF WOUND Right 12/08/2014   Procedure: IRRIGATION AND DEBRIDEMENT SACRAL WOUND AND RIGHT ISCHIAL WOUND ;  Surgeon: Theodoro Kos, DO;  Location: [place];  Service: Plastics;  Laterality: Right;  . INCISION AND DRAINAGE OF WOUND Bilateral 05/05/2015   Procedure: IRRIGATION AND DEBRIDEMENT SACRAL ULCER;  Surgeon: Theodoro Kos, DO;  Location: [place];  Service: Plastics;  Laterality: Bilateral;  . INCISION AND DRAINAGE OF WOUND N/A 08/04/2015   Procedure: IRRIGATION AND DEBRIDEMENT OF SACRAL ULCER AND ;  Surgeon: Loel Lofty Dillingham, DO;  Location: [place];  Service: Plastics;  Laterality: N/A;  . INSERTION OF SUPRAPUBIC CATHETER N/A 10/12/2014   Procedure: INSERTION OF SUPRAPUBIC CATHETER;  Surgeon: Reece Packer, MD;  Location: [place];  Service: Urology;  Laterality: N/A;  . IR GENERIC HISTORICAL  07/11/2016   IR CM INJ ANY COLONIC TUBE W/FLUORO 07/11/2016 Arne Cleveland, MD [facility]-INTERV RAD  . LAPAROSCOPIC DIVERTED COLOSTOMY N/A 10/12/2014   Procedure: LAPAROSCOPIC DIVERTING COLOSTOMY;  Surgeon: Donnie Mesa, MD;  Location: [place];  Service: General;  Laterality: N/A;  . MINOR APPLICATION OF WOUND VAC N/A 11/25/2014   Procedure:  WOUND VAC CHANGE;  Surgeon: Theodoro Kos, DO;  Location: [place];  Service: Plastics;  Laterality: N/A;  . MULTIPLE EXTRACTIONS WITH ALVEOLOPLASTY N/A 08/03/2014   Procedure: MULTIPLE EXTRACTIONS;  Surgeon: Gae Bon, DDS;  Location: [place];  Service: Oral Surgery;  Laterality: N/A;  . RADIOLOGY WITH ANESTHESIA N/A 07/05/2016   Procedure: RADIOLOGY WITH ANESTHESIA MRI OF PELVIS WITH AND WITHOUT;  Surgeon: Medication Radiologist, MD;  Location: [place];  Service:  Radiology;  Laterality: N/A;  . TEE WITHOUT CARDIOVERSION N/A 08/17/2014   Procedure: TRANSESOPHAGEAL ECHOCARDIOGRAM (TEE);  Surgeon: Dorothy Spark, MD;  Location: [place];  Service: Cardiovascular;  Laterality: N/A;  . TEE WITHOUT CARDIOVERSION N/A 05/05/2015   Procedure: TRANSESOPHAGEAL ECHOCARDIOGRAM (TEE);  Surgeon: Lelon Perla, MD;  [facility] ENDOSCOPY;  Service: Cardiovascular;  Laterality: N/A;  . TONSILLECTOMY      Social History Social History   Social History  . Marital status: Single    Spouse name: N/A  . Number of children: 0  . Years of education: 79   Occupational History  . Disability    Social History Main Topics  . Smoking status: Former Smoker    Packs/day: 1.00    Years: 12.00    Types: Cigars, Cigarettes    Quit date: 09/07/2014  . Smokeless tobacco: Never  Used  . Alcohol use No  . Drug use: No  . Sexual activity: Not Currently   Other Topics Concern  . Not on file   Social History Narrative   Patient lives at home with mother father.    Patient has 2 children.    Patient is single.    Patient was left hand but after stroke patient is now right handed.    Patient has  11th grade education.      Allergies  Allergen Reactions  . Amikacin Sulfate Other (See Comments)    Seizure   . Cefuroxime Axetil Anaphylaxis    Tolerated cefepime 12/2015 - with Pepcid/Solu-Medrol  . Ertapenem Other (See Comments)    Rash and confusion-->tolerated Imipenem   . Morphine And Related Other (See Comments)    Changed mental status, confusion, headache, visual hallucination  . Nsaids Itching and Other (See Comments)    Risk of bleeding, itching  . Penicillins Anaphylaxis and Other (See Comments)    Tolerated Imipenem; no reaction to 7 day course of amoxicillin in 2015 Has patient had a PCN reaction causing immediate rash, facial/tongue/throat swelling, SOB or lightheadedness with hypotension: Yes Has patient had a PCN reaction causing severe rash  involving mucus membranes or skin necrosis: No Has patient had a PCN reaction that required hospitalization Yes Has patient had a PCN reaction occurring within the last 10 years: No If all of the above answers are "NO", then may proceed with Cephalo  . Sulfa Antibiotics Anaphylaxis, Shortness Of Breath and Other (See Comments)  . Tessalon [Benzonatate] Anaphylaxis  . Levaquin [Levofloxacin In D5w] Swelling    Hand and forearm swelling  . Rocephin [Ceftriaxone] Other (See Comments)    Confusion  . Shellfish Allergy Itching and Other (See Comments)    Took benadryl to alleviate reaction  . Miripirium Rash and Other (See Comments)    Change in mental status    Family History  Problem Relation Age of Onset  . Diabetes Father   . Hypertension Father   . Heart disease Mother     congenital - MVP  . Asthma      fhx  . Hypertension      fhx  . Stroke      fhx      Prior to Admission medications   Medication Sig Start Date End Date Taking? Authorizing Provider  acetaminophen (TYLENOL) 325 MG tablet Take 2 tablets (650 mg total) by mouth every 6 (six) hours as needed for mild pain, moderate pain, fever or headache. 01/14/15  Yes Jessica U Vann, DO  albuterol (PROVENTIL) (2.5 MG/3ML) 0.083% nebulizer solution Take 3 mLs (2.5 mg total) by nebulization 4 (four) times daily. 08/03/16  Yes [name], MD  baclofen (LIORESAL) 20 MG tablet Take 1 tablet (20 mg total) by mouth 4 (four) times daily. 06/26/16  Yes [name], MD  BISACODYL 5 MG EC tablet Take ONE (1) tablet by mouth twice daily as needed FOR MODERATE CONSTIPATION Patient taking differently: Take 5 MG by mouth twice daily as needed FOR MODERATE CONSTIPATION 06/19/16  Yes [name], MD  buPROPion (WELLBUTRIN XL) 150 MG 24 hr tablet Take 1 tablet (150 mg total) by mouth daily. 06/15/16  Yes [name], MD  camphor-menthol [facility]) lotion Apply topically as needed for itching. 09/02/15  Yes Shanker Kristeen Mans, MD  collagenase  (SANTYL) ointment Apply 1 application topically 3 (three) times a week. Patient taking differently: Apply 1 application topically as needed (  dressing change).  08/24/16  Yes [name], MD  dexlansoprazole (DEXILANT) 60 MG capsule Take 1 capsule (60 mg total) by mouth daily. 03/14/16  Yes [name], MD  docusate (COLACE) 50 MG/5ML liquid Take 10 mLs (100 mg total) by mouth 2 (two) times daily. Patient taking differently: Take 100 mg by mouth daily.  11/10/15  Yes [name], MD  fentaNYL (DURAGESIC - DOSED MCG/HR) 75 MCG/HR place 1 patch (47mcg total) onto the skin every 3 (three) days 08/24/16  Yes [name], MD  ferrous sulfate 325 (65 FE) MG tablet Take 1 tablet (325 mg total) by mouth daily with breakfast. 09/27/15  Yes [name], MD  glucagon (GLUCAGON EMERGENCY) 1 MG injection INJECT INTO MUSCLE ONCE AS NEEDED Patient taking differently: Inject 1 mg into the muscle once as needed (hypoglycemia). INJECT INTO MUSCLE ONCE AS NEEDED 07/05/15  Yes Renato Shin, MD  GLUCERNA (GLUCERNA) LIQD 1,000 mLs by PEG Tube route continuous. 70 ml/hr   Yes Historical Provider, MD  hydrocortisone (CORTEF) 10 MG tablet Take 1 tablet (10 mg total) by mouth every evening. Hold for the next 5 days, while using solumedrol. Then resume as previously instructed 08/18/16  Yes Verlee Monte, MD  HYDROmorphone (DILAUDID) 8 MG tablet Take ONE (1) tablet (8mg  total) by mouth every 6 hours as needed for severe pain 08/24/16  Yes [name], MD  insulin aspart (NOVOLOG FLEXPEN) 100 UNIT/ML FlexPen Inject 0-10 Units into the skin 4 (four) times daily as needed for high blood sugar (CBG >200). Per sliding scale: CBG <200 = 0 units, 201-300 = 4 units, 301-400 = 6 units, >400 = 10 units   Yes Historical Provider, MD  insulin glargine (LANTUS) 100 UNIT/ML injection Inject 0.22 mLs (22 Units total) into the skin at bedtime. Patient taking differently: Inject 22 Units into the skin every morning.  07/31/16  Yes Barton Dubois, MD  loratadine (CLARITIN) 10 MG tablet Take 1 tablet (10 mg total) by mouth daily. 11/25/15  Yes [name], MD  LYRICA 100 MG capsule Take ONE (1)  capsule by mouth three times daily (morning,afternoon and evening) and 2 capsules at bedtime Patient taking differently: Take 100 mg by mouth three times a day (morning/afternoon/evening) and 200 mg at bedtime 05/25/16  Yes [name], MD  Magnesium Oxide 400 (240 Mg) MG TABS Take ONE (1) tablet by mouth twice daily Patient taking differently: Take 400 MG by mouth twice daily 08/09/16  Yes [name], MD  midodrine (PROAMATINE) 10 MG tablet Take 2 tablets (20 mg total) by mouth 3 (three) times daily with meals. Patient taking differently: Take 40 mg by mouth daily as needed.  10/28/15  Yes [name], MD  Multiple Vitamin (MULTIVITAMIN WITH MINERALS) TABS tablet Place 1 tablet into feeding tube daily. 06/01/15  Yes Erick Colace, NP  Omeprazole-Sodium Bicarbonate (ZEGERID OTC PO) Take 1 tablet by mouth as needed (for acid reflux or heartburn).   Yes Historical Provider, MD  ondansetron (ZOFRAN ODT) 8 MG disintegrating tablet Place 1 tablet (8 mg total) into feeding tube every 8 (eight) hours as needed for nausea or vomiting. 06/01/15  Yes Erick Colace, NP  oxybutynin (DITROPAN) 5 MG tablet Place 1 tablet (5 mg total) into feeding tube 2 (two) times daily. 08/23/16  Yes [name], MD  Oxycodone HCl 20 MG TABS Take 1 tablet (20 mg total) by mouth every 6 (six) hours as needed (pain).  08/24/16  Yes [name], MD  VOLTAREN 1 % GEL apply 2 grams topically twice a day Patient taking differently: Apply 2 grams topically twice a day as needed for shoulder pain 04/24/16  Yes [name], MD  Water For Irrigation, Sterile (FREE WATER) SOLN Place 200 mLs into feeding tube every 8 (eight) hours. Patient taking differently: Place 300-400 mLs into feeding tube every 8 (eight) hours.  10/11/15  Yes [name], MD  famotidine (PEPCID) 10 MG  tablet Take 1 tablet (10 mg total) by mouth every 8 (eight) hours. Patient not taking: Reported on 08/11/2016 08/18/16   Verlee Monte, MD  famotidine (PEPCID) 20 MG tablet Take 1 tablet (20 mg total) by mouth 2 (two) times daily. Take around antibiotics infusion time Patient not taking: Reported on 08/18/2016 07/31/16   Barton Dubois, MD  hydrocortisone (CORTEF) 20 MG tablet Take 1 tablet (20 mg total) by mouth every morning. Hold for the next 5 days, while on solumedrol. Resume after this time as previously 08/18/16   Verlee Monte, MD  LORazepam (ATIVAN) 0.5 MG tablet Take 1 tablet (0.5 mg total) by mouth 3 (three) times daily as needed for anxiety. Reported on 11/02/2015 Patient taking differently: Take 0.25-0.5 mg by mouth 3 (three) times daily as needed for anxiety. Reported on 11/02/2015 06/20/16   [name], MD  mupirocin ointment Drue Stager) 2 % Apply to affected area twice a day as needed 06/19/16   [name], MD    Physical Exam:    Vitals:   09/06/2016 1430 09/05/2016 1445 08/11/2016 1500 08/18/2016 1603  BP: (!) 73/47 (!) 69/49 (!) 77/54   Pulse: 75 72 69 69  Resp: 18 19 16 16   Temp:      TempSrc:      SpO2: 99% 98% 99% 100%  Weight:      Height:           Constitutional: NAD, calm, comfortable  Vitals:   09/03/2016 1430 08/09/2016 1445 08/14/2016 1500 09/04/2016 1603  BP: (!) 73/47 (!) 69/49 (!) 77/54   Pulse: 75 72 69 69  Resp: 18 19 16 16   Temp:      TempSrc:      SpO2: 99% 98% 99% 100%  Weight:      Height:       Eyes: PERRL, lids and conjunctivae normal ENMT: Mucous membranes are moist. Posterior pharynx clear of any exudate or lesions.Normal dentition.  Neck: normal, supple, no masses, no thyromegaly Respiratory: clear to auscultation bilaterally, no wheezing, no crackles. Normal respiratory effort. No accessory muscle use.  Cardiovascular: Regular rate and rhythm, no murmurs / rubs / gallops. No extremity edema. 2+ pedal pulses. No carotid bruits.  Abdomen: PEG  tube suprapubiccatheter colostomy Musculoskeletal:Muscle wasting is noted in the upper and lower extremities. No other bony abnormalities are appreciated . Skin:Known stage IV decubiti ulcer in the sacral area  Neurologic: Known functional quadriplegia. He is paralyzed from the chest down. He is lethargic at this time. He does appear to respond to sound.    Labs on Admission: I have personally reviewed following labs and imaging studies  CBC:  Recent Labs Lab 08/29/2016 1116  WBC 30.8*  NEUTROABS 27.4*  HGB 7.9*  HCT 24.6*  MCV 83.1  PLT 503*    Basic Metabolic Panel:  Recent Labs Lab 09/02/2016 1116  NA 123*  K 4.2  CL 99*  CO2 20*  GLUCOSE 125*  BUN 40*  CREATININE 0.76  CALCIUM  8.7*    GFR: Estimated Creatinine Clearance: 102.9 mL/min (by C-G formula based on SCr of 0.76 mg/dL).  Liver Function Tests:  Recent Labs Lab 08/28/2016 1116  AST 14*  ALT 10*  ALKPHOS 92  BILITOT 0.3  PROT 4.9*  ALBUMIN 1.5*    Recent Labs Lab 08/16/2016 1116  LIPASE 13   No results for input(s): AMMONIA in the last 168 hours.  Coagulation Profile: No results for input(s): INR, PROTIME in the last 168 hours.  Cardiac Enzymes: No results for input(s): CKTOTAL, CKMB, CKMBINDEX, TROPONINI in the last 168 hours.  BNP (last 3 results) No results for input(s): PROBNP in the last 8760 hours.  HbA1C: No results for input(s): HGBA1C in the last 72 hours.  CBG: No results for input(s): GLUCAP in the last 168 hours.  Lipid Profile: No results for input(s): CHOL, HDL, LDLCALC, TRIG, CHOLHDL, LDLDIRECT in the last 72 hours.  Thyroid Function Tests: No results for input(s): TSH, T4TOTAL, FREET4, T3FREE, THYROIDAB in the last 72 hours.  Anemia Panel: No results for input(s): VITAMINB12, FOLATE, FERRITIN, TIBC, IRON, RETICCTPCT in the last 72 hours.  Urine analysis:    Component Value Date/Time   COLORURINE YELLOW 08/25/2016 1204   APPEARANCEUR CLOUDY (A) 08/28/2016 1204    LABSPEC 1.020 09/05/2016 1204   PHURINE 5.0 08/09/2016 1204   GLUCOSEU NEGATIVE 08/21/2016 1204   HGBUR LARGE (A) 09/03/2016 1204   BILIRUBINUR NEGATIVE 08/11/2016 1204   BILIRUBINUR neg 06/28/2014 1445   KETONESUR NEGATIVE 08/12/2016 1204   PROTEINUR 30 (A) 08/25/2016 1204   UROBILINOGEN 0.2 07/27/2015 0645   NITRITE NEGATIVE 08/20/2016 1204   LEUKOCYTESUR MODERATE (A) 08/28/2016 1204    Sepsis Labs: @LABRCNTIP (procalcitonin:4,lacticidven:4) )No results found for this or any previous visit (from the past 240 hour(s)).   Radiological Exams on Admission: Ct Abdomen Pelvis W Contrast  Result Date: 08/14/2016 CLINICAL DATA:  Shortness of Breath.  Blood in colostomy bag. EXAM: CT ABDOMEN AND PELVIS WITH CONTRAST TECHNIQUE: Multidetector CT imaging of the abdomen and pelvis was performed using the standard protocol following bolus administration of intravenous contrast. CONTRAST:  100 cc Isovue-300 IV COMPARISON:  12/10/2015 FINDINGS: Lower chest: The distal esophagus is filled with debris. Small bilateral pleural effusions. Dense consolidation in the lower lobes bilaterally, similar to prior study. Heart is normal size. Hepatobiliary: No focal hepatic abnormality. Gallbladder unremarkable. Pancreas: No focal abnormality or ductal dilatation. Spleen: No focal abnormality.  Normal size. Adrenals/Urinary Tract: No adrenal abnormality. No focal renal abnormality. No stones or hydronephrosis. Urinary bladder is decompressed with mild wall thickening. Suprapubic catheter in place. Stomach/Bowel: Left lower quadrant ostomy noted. No evidence of bowel obstruction. Gastrostomy tube is in place within the stomach. Moderate debris/material within the stomach. Vascular/Lymphatic: No evidence of aneurysm in the left periaortic region adjacent to the L2 level, there is irregular mixed density necrotic adenopathy versus periaortic abscess measuring 2.7 x 2.0 cm. Areas of high density noted within this periaortic  process is well as the right psoas muscle of unknown etiology, possible calcifications from chronic process. IVC filter in place within the infrarenal IVC. Reproductive: No visible focal abnormality. Other: Small amount of free fluid adjacent to the decompressed urinary bladder. No free air. Musculoskeletal: Probable large decubitus ulcer noted over the sacrum and right posterior iliac bone. Underlying sclerosis within the right posterior iliac bone is increased since prior study and may reflect chronic osteomyelitis. There is gas and fluid noted within the adjacent right SI joint. This is new since prior study.  This is most compatible with septic arthritis. The adjacent iliac bone demonstrates irregular lucency and bony destruction compatible with adjacent osteomyelitis. Fluid and gas extend into the right iliacus muscle as well as the iliopsoas and in the right psoas muscle superiorly compatible with intramuscular abscesses. Numerous abnormal vertebral bodies. Near complete destruction of the L2 vertebral body. Areas of lucency within L4, L1, T8 most compatible with multilevel osteomyelitis changes. Partial collapse and sclerosis in the L5 vertebral body, likely related to chronic osteomyelitis. 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 Evidence of septic arthritis within the right SI joint and acute osteomyelitis in the adjacent right iliac bone. Changes of multi level osteomyelitis throughout the vertebral bodies T8, L1, L2, L4, and L5. 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 Mixed density complex process in the left periaortic space adjacent to the L2 vertebral body which could reflect necrotic and partially calcified adenopathy or periaortic abscess. Gastrostomy tube in expected position. Small bilateral pleural effusions. Dense consolidation in the lower lobes  is stable since prior study and most compatible with pneumonia. Debris/ fluid filled dilated distal esophagus and stomach. These results were called by telephone at the time of interpretation on 09/02/2016 at 2:25 pm to Dr. Nanda Quinton , who verbally acknowledged these results. Electronically Signed   By: Rolm Baptise M.D.   On: 08/12/2016 14:29   Dg Chest Portable 1 View  Result Date: 08/09/2016 CLINICAL DATA:  Altered mental status. EXAM: PORTABLE CHEST 1 VIEW COMPARISON:  08/13/2016 FINDINGS: Tracheostomy tube and right PICC line remain in place, unchanged. There is mild cardiomegaly. Bilateral lower lobe airspace opacities, similar on the right prior study, increased on the left. No visible effusions. No acute bony abnormality. IMPRESSION: Bibasilar airspace opacities concerning for pneumonia, increasing on the left since prior study. Electronically Signed   By: Rolm Baptise M.D.   On: 08/10/2016 12:35    EKG: Independently reviewed.  Assessment/Plan 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 ([name])   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([name])   History of pulmonary embolism   DVT (deep venous thrombosis) ([name])   Severe protein-calorie malnutrition ([name])   Dysphagia, pharyngoesophageal phase   Decubitus ulcer of right perineal ischial region, stage 4 ([name])   Decubitus ulcer of left perineal ischial region, stage 4 ([name])   Sacral decubitus ulcer, stage IV ([name])   Multiple allergies   Recurrent UTI   Respiratory failure, chronic ([name])   Aspiration pneumonia of right lower lobe ([name])   Seizures ([name])   Anxiety state   Anemia of chronic disease   Hyponatremia  BP (!) 77/54   Pulse 69   Temp 97.7 F (36.5 C) (Oral)   Resp 16   Ht 5\' 8"  (1.727 m)   Wt 54.9 kg (121 lb)   SpO2 100%   BMI 18.40 kg/m    Presumed Sepsis likely due to urine respiratory  source, vs IM abscess per CT AP , vs. Septic arthritis, vs Diarrhea infection ( antibiotics, versus recent laxatives may be a component as well)  , organism unknown  leukocytosis, Hypotension, normal lactate . UA with many bacteria Antibiotics delivered in the ED per Pharmacy . Hcult + Patient on trach Admit to SDU Sepsis order set  Droplet  precaution IV antibiotics by pharmacy with   levaquin, aztreonam and vancomycin per PharmacyDuoNebs Follow lactic acid q 6 hrs Follow blood and urine and C diff culture IV fluids at 125 cc/h.  Procalcitonin order set  UA, cultures  Infectious diseases to consult  Consult to wound care for  Large decubitus ulcer over the right sacrum and posterior iliac bone with chronic osteomyelitis changes in the posterior iliac bone, ostomy  And peg site  Type I Diabetes Current blood sugar level is  Lab Results  Component Value Date   HGBA1C 8.0 (H) 07/04/2016   Hgb A1C  SSI  Recent PNA, finishing a course of aztreonam. Trach in place. Afebrile  CXR  Cultures   DVT prophylaxis: SCD's  Code Status:   Full    Family Communication:  Discussed with parents Disposition Plan: Expect patient to be discharged to home after condition improves Consults called:    None Admission status: SDU    [facility] E, PA-C [facility]   08/12/2016, 4:11 PM

## 2016-08-27 ENCOUNTER — Inpatient Hospital Stay (HOSPITAL_COMMUNITY): Payer: Medicaid Other

## 2016-08-27 ENCOUNTER — Other Ambulatory Visit: Payer: Self-pay | Admitting: Family Medicine

## 2016-08-27 DIAGNOSIS — E10621 Type 1 diabetes mellitus with foot ulcer: Secondary | ICD-10-CM

## 2016-08-27 DIAGNOSIS — L97519 Non-pressure chronic ulcer of other part of right foot with unspecified severity: Secondary | ICD-10-CM

## 2016-08-27 DIAGNOSIS — M4620 Osteomyelitis of vertebra, site unspecified: Secondary | ICD-10-CM

## 2016-08-27 DIAGNOSIS — K3184 Gastroparesis: Secondary | ICD-10-CM

## 2016-08-27 DIAGNOSIS — L97529 Non-pressure chronic ulcer of other part of left foot with unspecified severity: Secondary | ICD-10-CM

## 2016-08-27 DIAGNOSIS — D500 Iron deficiency anemia secondary to blood loss (chronic): Secondary | ICD-10-CM

## 2016-08-27 DIAGNOSIS — Z86711 Personal history of pulmonary embolism: Secondary | ICD-10-CM

## 2016-08-27 DIAGNOSIS — R410 Disorientation, unspecified: Secondary | ICD-10-CM

## 2016-08-27 DIAGNOSIS — Z9359 Other cystostomy status: Secondary | ICD-10-CM

## 2016-08-27 DIAGNOSIS — K922 Gastrointestinal hemorrhage, unspecified: Secondary | ICD-10-CM

## 2016-08-27 DIAGNOSIS — N390 Urinary tract infection, site not specified: Secondary | ICD-10-CM

## 2016-08-27 DIAGNOSIS — E1143 Type 2 diabetes mellitus with diabetic autonomic (poly)neuropathy: Secondary | ICD-10-CM

## 2016-08-27 DIAGNOSIS — N319 Neuromuscular dysfunction of bladder, unspecified: Secondary | ICD-10-CM

## 2016-08-27 DIAGNOSIS — L89314 Pressure ulcer of right buttock, stage 4: Secondary | ICD-10-CM

## 2016-08-27 DIAGNOSIS — L89324 Pressure ulcer of left buttock, stage 4: Secondary | ICD-10-CM

## 2016-08-27 DIAGNOSIS — D638 Anemia in other chronic diseases classified elsewhere: Secondary | ICD-10-CM

## 2016-08-27 DIAGNOSIS — F411 Generalized anxiety disorder: Secondary | ICD-10-CM

## 2016-08-27 DIAGNOSIS — R1314 Dysphagia, pharyngoesophageal phase: Secondary | ICD-10-CM

## 2016-08-27 LAB — COMPREHENSIVE METABOLIC PANEL
ALBUMIN: 1.4 g/dL — AB (ref 3.5–5.0)
ALK PHOS: 100 U/L (ref 38–126)
ALT: 11 U/L — ABNORMAL LOW (ref 17–63)
AST: 15 U/L (ref 15–41)
Anion gap: 3 — ABNORMAL LOW (ref 5–15)
BILIRUBIN TOTAL: 0.2 mg/dL — AB (ref 0.3–1.2)
BUN: 40 mg/dL — AB (ref 6–20)
CO2: 20 mmol/L — AB (ref 22–32)
Calcium: 8.4 mg/dL — ABNORMAL LOW (ref 8.9–10.3)
Chloride: 104 mmol/L (ref 101–111)
Creatinine, Ser: 0.7 mg/dL (ref 0.61–1.24)
GFR calc Af Amer: >60 mL/min (ref >60)
GFR calc non Af Amer: >60 mL/min (ref >60)
GLUCOSE: 22 mg/dL — AB (ref 65–99)
POTASSIUM: 3.7 mmol/L (ref 3.5–5.1)
Sodium: 127 mmol/L — ABNORMAL LOW (ref 135–145)
TOTAL PROTEIN: 4.6 g/dL — AB (ref 6.5–8.1)

## 2016-08-27 LAB — CBC WITH DIFFERENTIAL/PLATELET
BASOS ABS: 0 10*3/uL (ref 0.0–0.1)
Basophils Relative: 0 %
Eosinophils Absolute: 0 10*3/uL (ref 0.0–0.7)
Eosinophils Relative: 0 %
HEMATOCRIT: 22 % — AB (ref 39.0–52.0)
HEMOGLOBIN: 7 g/dL — AB (ref 13.0–17.0)
LYMPHS PCT: 3 %
Lymphs Abs: 0.9 10*3/uL (ref 0.7–4.0)
MCH: 26.4 pg (ref 26.0–34.0)
MCHC: 31.8 g/dL (ref 30.0–36.0)
MCV: 83 fL (ref 78.0–100.0)
MONOS PCT: 6 %
Monocytes Absolute: 1.7 10*3/uL — ABNORMAL HIGH (ref 0.1–1.0)
Neutro Abs: 26.5 10*3/uL — ABNORMAL HIGH (ref 1.7–7.7)
Neutrophils Relative %: 91 %
Platelets: 372 10*3/uL (ref 150–400)
RBC: 2.65 MIL/uL — AB (ref 4.22–5.81)
RDW: 17.5 % — AB (ref 11.5–15.5)
WBC Morphology: INCREASED
WBC: 29.1 10*3/uL — AB (ref 4.0–10.5)

## 2016-08-27 LAB — PREPARE RBC (CROSSMATCH)

## 2016-08-27 LAB — GLUCOSE, CAPILLARY
GLUCOSE-CAPILLARY: 26 mg/dL — AB (ref 65–99)
GLUCOSE-CAPILLARY: 120 mg/dL — AB (ref 65–99)
GLUCOSE-CAPILLARY: 94 mg/dL (ref 65–99)
Glucose-Capillary: 115 mg/dL — ABNORMAL HIGH (ref 65–99)
Glucose-Capillary: 126 mg/dL — ABNORMAL HIGH (ref 65–99)
Glucose-Capillary: 205 mg/dL — ABNORMAL HIGH (ref 65–99)

## 2016-08-27 LAB — HEMOGLOBIN AND HEMATOCRIT, BLOOD
HCT: 32.4 % — ABNORMAL LOW (ref 39.0–52.0)
HEMOGLOBIN: 10.9 g/dL — AB (ref 13.0–17.0)

## 2016-08-27 LAB — MAGNESIUM: Magnesium: 1.9 mg/dL (ref 1.7–2.4)

## 2016-08-27 LAB — MRSA PCR SCREENING: MRSA BY PCR: POSITIVE — AB

## 2016-08-27 LAB — PHOSPHORUS: Phosphorus: <1 mg/dL — CL (ref 2.5–4.6)

## 2016-08-27 MED ORDER — POTASSIUM PHOSPHATES 15 MMOLE/5ML IV SOLN
40.0000 meq | Freq: Once | INTRAVENOUS | Status: AC
  Administered 2016-08-27: 40 meq via INTRAVENOUS
  Filled 2016-08-27: qty 9.09

## 2016-08-27 MED ORDER — MUPIROCIN 2 % EX OINT
1.0000 "application " | TOPICAL_OINTMENT | Freq: Two times a day (BID) | CUTANEOUS | Status: AC
  Administered 2016-08-27 – 2016-08-31 (×10): 1 via NASAL
  Filled 2016-08-27 (×2): qty 22

## 2016-08-27 MED ORDER — VANCOMYCIN HCL 500 MG IV SOLR
500.0000 mg | Freq: Two times a day (BID) | INTRAVENOUS | Status: DC
  Administered 2016-08-27 – 2016-08-28 (×2): 500 mg via INTRAVENOUS
  Filled 2016-08-27 (×3): qty 500

## 2016-08-27 MED ORDER — KCL IN DEXTROSE-NACL 10-5-0.45 MEQ/L-%-% IV SOLN
INTRAVENOUS | Status: DC
  Administered 2016-08-27: 10:00:00 via INTRAVENOUS
  Filled 2016-08-27: qty 1000

## 2016-08-27 MED ORDER — DEXTROSE 5 % IV SOLN
1.0000 g | Freq: Three times a day (TID) | INTRAVENOUS | Status: DC
  Administered 2016-08-28 (×2): 1 g via INTRAVENOUS
  Filled 2016-08-27 (×3): qty 1

## 2016-08-27 MED ORDER — MIDODRINE HCL 5 MG PO TABS
20.0000 mg | ORAL_TABLET | Freq: Three times a day (TID) | ORAL | Status: DC
  Administered 2016-08-27 – 2016-08-30 (×5): 20 mg via ORAL
  Filled 2016-08-27 (×5): qty 4

## 2016-08-27 MED ORDER — SODIUM CHLORIDE 0.9 % IV BOLUS (SEPSIS)
1000.0000 mL | Freq: Once | INTRAVENOUS | Status: AC
  Administered 2016-08-27: 1000 mL via INTRAVENOUS

## 2016-08-27 MED ORDER — CHLORHEXIDINE GLUCONATE CLOTH 2 % EX PADS
6.0000 | MEDICATED_PAD | Freq: Every day | CUTANEOUS | Status: AC
  Administered 2016-08-27 – 2016-08-31 (×5): 6 via TOPICAL

## 2016-08-27 MED ORDER — DEXTROSE 50 % IV SOLN
INTRAVENOUS | Status: AC
  Administered 2016-08-27: 09:00:00
  Filled 2016-08-27: qty 50

## 2016-08-27 MED ORDER — SODIUM CHLORIDE 0.9 % IV SOLN
Freq: Once | INTRAVENOUS | Status: AC
  Administered 2016-08-27: 12:00:00 via INTRAVENOUS

## 2016-08-27 MED ORDER — DEXTROSE-NACL 5-0.9 % IV SOLN
INTRAVENOUS | Status: DC
  Administered 2016-08-27 – 2016-08-29 (×2): via INTRAVENOUS

## 2016-08-27 MED ORDER — DEXTROSE 5 % IV SOLN
2.0000 g | Freq: Once | INTRAVENOUS | Status: AC
  Administered 2016-08-27: 2 g via INTRAVENOUS
  Filled 2016-08-27: qty 2

## 2016-08-27 NOTE — Consult Note (Signed)
[facility] for Infectious Disease  Total days of antibiotics 2        Day 2 linezolid               Reason for Consult: osteo    Referring Physician: sheikh  Active Problems:   Gastroparesis due to DM ([place])   Altered mental status   Neurogenic bladder   Suprapubic catheter ([place])   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 ([place])   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([place])   History of pulmonary embolism   DVT (deep venous thrombosis) ([name])   Severe protein-calorie malnutrition ([place])   Dysphagia, pharyngoesophageal phase   Decubitus ulcer of right perineal ischial region, stage 4 ([place])   Decubitus ulcer of left perineal ischial region, stage 4 ([place])   Sacral decubitus ulcer, stage IV ([name])   Multiple allergies   Recurrent UTI   Respiratory failure, chronic ([name])   Aspiration pneumonia of right lower lobe ([name])   Seizures ([name])   Anxiety state   Anemia of chronic disease   Hyponatremia    HPI: Jason [name] is a 30 y.o. male  well known to ID service. He has hx of C6 paragplegia complicated by neurogenic bladder and chronic pressure ulcer stage IV, pelvic osteo under the care of plastic surgery at [facility] (Dr. Luetta Nutting) who was admitted for UTI associated bacteremia (proteus,ecoli, group b strep), and pneumonia within the past month. He has numerous abtx drug allergy where he is often pretreated with steroids and h2 blockers for which he was taking in the last 3-4 weeks  He is readmitted on 11/19 for having blood noted in his colostomy for which he has had serial low hemoglobin on recent admission. He last had blood tsf 1 week prior to admit. His mother reports worsening lethargy and decrease urine output in the last 3 days in the setting of increase watery diarrhea in the last 2 days. No fevers, but hypothermic. His WBC is elevated at 31K with bandemia on this admission (up from 21K when he left on 11/11). His labs also show hyponatremia of Na 127, BUN of 40, Cr 0.7. He  had abdomen pelvis CT that showed L2 near complete destruction, T8, and L4 changes concern for osteomyelitis with a 2.7 x 2.0 necrotic periarotic lymphnode vs. Abscess, which is new though he has not had any recent abdominal imaging, on pelvis due this his chronic osteo and pressrue ulcers Stage IV. He has been seen by GI who plan to do colonoscopy to look for source of GI bleed, IR who replaced PEG, and he has been evaluated by [facility] nurse team which described his decub ulcers: Sacrum wound is 70% red, 10% exposed bone, 20% black, mod amt tan drainage, strong odor.  Undermining to wound edges. Stage 2 wounds are pink and dry to left heel, right elbow, and back and left thigh are full thickness healing wounds, pink and dry.   ID asked to weigh in on abtx choice for osteo Past Medical History:  Diagnosis Date  . Anemia   . Anxiety   . Arthritis   . Asthma   . 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 Colostomy in place [facility])   . Complication of anesthesia    difficulty waking up, will have sudden drop in BP and O2 sats  . Depression   . Diabetic neuropathy ([place])   . Diabetic retinopathy ([place])   . Dysrhythmia    usually  due to medications  . Family history of anesthesia complication    Pt mother can't have epidural procedures  . Fibromyalgia   . Gastroparesis   . GERD (gastroesophageal reflux disease)   . H/O constipation   . H/O respiratory failure   . H/O thrombosis   . Hx MRSA infection    on face  . Multiple allergies 11/02/2015  . Pneumonia   . Recurrent UTI 11/02/2015  . Seizures ([place]) 2017   one time during admission in hosptital  . 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 Syncope 02/16/2015  . Tracheostomy present ([place])   . Type I diabetes mellitus ([place])    sees Dr. Loanne Drilling     Allergies:  Allergies  Allergen Reactions  . Amikacin Sulfate Other (See Comments)    Seizure   . Cefuroxime Axetil Anaphylaxis    Tolerated  cefepime 12/2015 - with Pepcid/Solu-Medrol  . Ertapenem Other (See Comments)    Rash and confusion-->tolerated Imipenem   . Morphine And Related Other (See Comments)    Changed mental status, confusion, headache, visual hallucination  . Nsaids Itching and Other (See Comments)    Risk of bleeding, itching  . Penicillins Anaphylaxis and Other (See Comments)    Tolerated Imipenem; no reaction to 7 day course of amoxicillin in 2015 Has patient had a PCN reaction causing immediate rash, facial/tongue/throat swelling, SOB or lightheadedness with hypotension: Yes Has patient had a PCN reaction causing severe rash involving mucus membranes or skin necrosis: No Has patient had a PCN reaction that required hospitalization Yes Has patient had a PCN reaction occurring within the last 10 years: No If all of the above answers are "NO", then may proceed with Cephalo  . Sulfa Antibiotics Anaphylaxis, Shortness Of Breath and Other (See Comments)  . Tessalon [Benzonatate] Anaphylaxis  . Levaquin [Levofloxacin In D5w] Swelling    Hand and forearm swelling  . Rocephin [Ceftriaxone] Other (See Comments)    Confusion  . Shellfish Allergy Itching and Other (See Comments)    Took benadryl to alleviate reaction  . Miripirium Rash and Other (See Comments)    Change in mental status    MEDICATIONS: . albuterol  2.5 mg Nebulization QID  . baclofen  20 mg Oral QID  . buPROPion  150 mg Oral Daily  . Chlorhexidine Gluconate Cloth  6 each Topical Q0600  . fentaNYL  75 mcg Transdermal Q72H  . ferrous sulfate  325 mg Oral Q breakfast  . hydrocortisone  10 mg Oral QPM  . hydrocortisone  20 mg Oral q morning - 10a  . insulin aspart  0-9 Units Subcutaneous TID WC  . midodrine  20 mg Oral TID WC  . mupirocin ointment  1 application Nasal BID  . oxybutynin  5 mg Per Tube BID  . pantoprazole  40 mg Oral Daily  . potassium phosphate IVPB (mEq)  40 mEq Intravenous Once  . pregabalin  100 mg Oral BID    Social  History  Substance Use Topics  . Smoking status: Former Smoker    Packs/day: 1.00    Years: 12.00    Types: Cigars, Cigarettes    Quit date: 09/07/2014  . Smokeless tobacco: Never Used  . Alcohol use No    Family History  Problem Relation Age of Onset  . Diabetes Father   . Hypertension Father   . Heart disease Mother     congenital - MVP  . Asthma  fhx  . Hypertension      fhx  . Stroke      fhx    Review of Systems -  Unable to obtain since he is encephalopathic  OBJECTIVE: Temp:  [94.8 F (34.9 C)-98.5 F (36.9 C)] 98.5 F (36.9 C) (11/20 1358) Pulse Rate:  [66-114] 85 (11/20 1358) Resp:  [10-24] 12 (11/20 1358) BP: (69-117)/(38-95) 106/61 (11/20 1358) SpO2:  [97 %-100 %] 99 % (11/20 1358) FiO2 (%):  [28 %-30 %] 28 % (11/20 1200) Physical Exam  Constitutional: He is oriented to person, only, arousable by his mother only. He appears chronically ill. No distress.  HENT:  Mouth/Throat: Oropharynx is dry Neck: trach collar in place Cardiovascular: Normal rate, regular rhythm and normal heart sounds. Exam reveals no gallop and no friction rub.  No murmur heard.  Pulmonary/Chest: Effort normal and breath sounds normal. No respiratory distress. He has no wheezes.  Abdominal: Soft. Bowel sounds are normal. He exhibits no distension. There is no tenderness.  Neurological: He partial distal strength to arms, LE flaccid paralysis with muscle wasting Skin: Skin is warm and dry.  Psychiatric: He is tearful when aroused  LABS: Results for orders placed or performed during the hospital encounter of 08/23/2016 (from the past 48 hour(s))  Comprehensive metabolic panel     Status: Abnormal   Collection Time: 09/04/2016 11:16 AM  Result Value Ref Range   Sodium 123 (L) 135 - 145 mmol/L   Potassium 4.2 3.5 - 5.1 mmol/L   Chloride 99 (L) 101 - 111 mmol/L   CO2 20 (L) 22 - 32 mmol/L   Glucose, Bld 125 (H) 65 - 99 mg/dL   BUN 40 (H) 6 - 20 mg/dL   Creatinine, Ser 0.76 0.61 -  1.24 mg/dL   Calcium 8.7 (L) 8.9 - 10.3 mg/dL   Total Protein 4.9 (L) 6.5 - 8.1 g/dL   Albumin 1.5 (L) 3.5 - 5.0 g/dL   AST 14 (L) 15 - 41 U/L   ALT 10 (L) 17 - 63 U/L   Alkaline Phosphatase 92 38 - 126 U/L   Total Bilirubin 0.3 0.3 - 1.2 mg/dL   GFR calc non Af Amer >60 >60 mL/min   GFR calc Af Amer >60 >60 mL/min    Comment: (NOTE) The eGFR has been calculated using the CKD EPI equation. This calculation has not been validated in all clinical situations. eGFR's persistently <60 mL/min signify possible Chronic Kidney Disease.    Anion gap 4 (L) 5 - 15  Lipase, blood     Status: None   Collection Time: 08/24/2016 11:16 AM  Result Value Ref Range   Lipase 13 11 - 51 U/L  CBC with Differential     Status: Abnormal   Collection Time: 08/31/2016 11:16 AM  Result Value Ref Range   WBC 30.8 (H) 4.0 - 10.5 K/uL   RBC 2.96 (L) 4.22 - 5.81 MIL/uL   Hemoglobin 7.9 (L) 13.0 - 17.0 g/dL   HCT 24.6 (L) 39.0 - 52.0 %   MCV 83.1 78.0 - 100.0 fL   MCH 26.7 26.0 - 34.0 pg   MCHC 32.1 30.0 - 36.0 g/dL   RDW 17.6 (H) 11.5 - 15.5 %   Platelets 503 (H) 150 - 400 K/uL   Neutrophils Relative % 89 %   Lymphocytes Relative 4 %   Monocytes Relative 7 %   Eosinophils Relative 0 %   Basophils Relative 0 %   Neutro Abs 27.4 (H) 1.7 - 7.7  K/uL   Lymphs Abs 1.2 0.7 - 4.0 K/uL   Monocytes Absolute 2.2 (H) 0.1 - 1.0 K/uL   Eosinophils Absolute 0.0 0.0 - 0.7 K/uL   Basophils Absolute 0.0 0.0 - 0.1 K/uL   WBC Morphology INCREASED BANDS (>20% BANDS)     Comment: TOXIC GRANULATION  Type and screen [place]     Status: None (Preliminary result)   Collection Time: 08/25/2016 11:16 AM  Result Value Ref Range   ABO/RH(D) O POS    Antibody Screen NEG    Sample Expiration 08/28/2016    Unit Number [id number]    Blood Component Type RED CELLS,LR    Unit division 00    Status of Unit ISSUED    Transfusion Status OK TO TRANSFUSE    Crossmatch Result Compatible    Unit Number [id number]     Blood Component Type RED CELLS,LR    Unit division 00    Status of Unit ISSUED    Transfusion Status OK TO TRANSFUSE    Crossmatch Result Compatible   I-Stat CG4 Lactic Acid, ED     Status: None   Collection Time: 08/25/2016 11:27 AM  Result Value Ref Range   Lactic Acid, Venous 0.62 0.5 - 1.9 mmol/L  Urinalysis, Routine w reflex microscopic (not at [facility])     Status: Abnormal   Collection Time: 08/25/2016 12:04 PM  Result Value Ref Range   Color, Urine YELLOW YELLOW   APPearance CLOUDY (A) CLEAR   Specific Gravity, Urine 1.020 1.005 - 1.030   pH 5.0 5.0 - 8.0   Glucose, UA NEGATIVE NEGATIVE mg/dL   Hgb urine dipstick LARGE (A) NEGATIVE   Bilirubin Urine NEGATIVE NEGATIVE   Ketones, ur NEGATIVE NEGATIVE mg/dL   Protein, ur 30 (A) NEGATIVE mg/dL   Nitrite NEGATIVE NEGATIVE   Leukocytes, UA MODERATE (A) NEGATIVE  Urine culture     Status: None (Preliminary result)   Collection Time: 08/21/2016 12:04 PM  Result Value Ref Range   Specimen Description URINE, SUPRAPUBIC    Special Requests NONE    Culture CULTURE REINCUBATED FOR BETTER GROWTH    Report Status PENDING   Urine microscopic-add on     Status: Abnormal   Collection Time: 08/25/2016 12:04 PM  Result Value Ref Range   Squamous Epithelial / LPF 0-5 (A) NONE SEEN   WBC, UA TOO NUMEROUS TO COUNT 0 - 5 WBC/hpf   RBC / HPF 6-30 0 - 5 RBC/hpf   Bacteria, UA RARE (A) NONE SEEN   Urine-Other YEAST PRESENT   Blood Culture (routine x 2)     Status: None (Preliminary result)   Collection Time: 08/31/2016 12:07 PM  Result Value Ref Range   Specimen Description BLOOD LEFT WRIST    Special Requests BOTTLES DRAWN AEROBIC AND ANAEROBIC 10CC    Culture NO GROWTH < 24 HOURS    Report Status PENDING   POC occult blood, ED RN will collect     Status: Abnormal   Collection Time: 08/21/2016 12:11 PM  Result Value Ref Range   Fecal Occult Bld POSITIVE (A) NEGATIVE  Protime-INR     Status: Abnormal   Collection Time: 09/03/2016  5:56 PM  Result  Value Ref Range   Prothrombin Time 15.6 (H) 11.4 - 15.2 seconds   INR 1.24   APTT     Status: Abnormal   Collection Time: 08/08/2016  5:56 PM  Result Value Ref Range   aPTT 58 (H) 24 - 36 seconds  Comment:        IF BASELINE aPTT IS ELEVATED, SUGGEST PATIENT RISK ASSESSMENT BE USED TO DETERMINE APPROPRIATE ANTICOAGULANT THERAPY.   Procalcitonin - Baseline     Status: None   Collection Time: 08/08/2016  5:56 PM  Result Value Ref Range   Procalcitonin 1.95 ng/mL    Comment:        Interpretation: PCT > 0.5 ng/mL and <= 2 ng/mL: Systemic infection (sepsis) is possible, but other conditions are known to elevate PCT as well. (NOTE)         ICU PCT Algorithm               Non ICU PCT Algorithm    ----------------------------     ------------------------------         PCT < 0.25 ng/mL                 PCT < 0.1 ng/mL     Stopping of antibiotics            Stopping of antibiotics       strongly encouraged.               strongly encouraged.    ----------------------------     ------------------------------       PCT level decrease by               PCT < 0.25 ng/mL       >= 80% from peak PCT       OR PCT 0.25 - 0.5 ng/mL          Stopping of antibiotics                                             encouraged.     Stopping of antibiotics           encouraged.    ----------------------------     ------------------------------       PCT level decrease by              PCT >= 0.25 ng/mL       < 80% from peak PCT        AND PCT >= 0.5 ng/mL             Continuing antibiotics                                              encouraged.       Continuing antibiotics            encouraged.    ----------------------------     ------------------------------     PCT level increase compared          PCT > 0.5 ng/mL         with peak PCT AND          PCT >= 0.5 ng/mL             Escalation of antibiotics                                          strongly encouraged.      Escalation of antibiotics  strongly encouraged.   Lactic acid, plasma     Status: None   Collection Time: 08/31/2016  5:57 PM  Result Value Ref Range   Lactic Acid, Venous 0.6 0.5 - 1.9 mmol/L  Culture, blood (x 2)     Status: None (Preliminary result)   Collection Time: 08/28/2016  6:10 PM  Result Value Ref Range   Specimen Description BLOOD RIGHT HAND    Special Requests IN PEDIATRIC BOTTLE 2CC    Culture NO GROWTH < 24 HOURS    Report Status PENDING   Culture, blood (x 2)     Status: None (Preliminary result)   Collection Time: 08/27/2016  6:14 PM  Result Value Ref Range   Specimen Description BLOOD RIGHT THUMB    Special Requests IN PEDIATRIC BOTTLE 1CC    Culture NO GROWTH < 24 HOURS    Report Status PENDING   Lactic acid, plasma     Status: None   Collection Time: 08/24/2016  8:18 PM  Result Value Ref Range   Lactic Acid, Venous 0.8 0.5 - 1.9 mmol/L  Glucose, capillary     Status: Abnormal   Collection Time: 08/28/2016  9:01 PM  Result Value Ref Range   Glucose-Capillary 137 (H) 65 - 99 mg/dL  MRSA PCR Screening     Status: Abnormal   Collection Time: 08/27/16  3:50 AM  Result Value Ref Range   MRSA by PCR POSITIVE (A) NEGATIVE    Comment:        The GeneXpert MRSA Assay (FDA approved for NASAL specimens only), is one component of a comprehensive MRSA colonization surveillance program. It is not intended to diagnose MRSA infection nor to guide or monitor treatment for MRSA infections. RESULT CALLED TO, READ BACK BY AND VERIFIED WITH: EVERETTE,D RN 08/27/16 AT 0515 SKEEN,P   Glucose, capillary     Status: Abnormal   Collection Time: 08/27/16  9:09 AM  Result Value Ref Range   Glucose-Capillary 26 (LL) 65 - 99 mg/dL   Comment 1 Notify RN    Comment 2 Document in Chart   Glucose, capillary     Status: Abnormal   Collection Time: 08/27/16  9:12 AM  Result Value Ref Range   Glucose-Capillary 26 (LL) 65 - 99 mg/dL  Glucose, capillary     Status: Abnormal   Collection Time: 08/27/16  9:42 AM    Result Value Ref Range   Glucose-Capillary 120 (H) 65 - 99 mg/dL  CBC with Differential/Platelet     Status: Abnormal   Collection Time: 08/27/16  9:50 AM  Result Value Ref Range   WBC 29.1 (H) 4.0 - 10.5 K/uL   RBC 2.65 (L) 4.22 - 5.81 MIL/uL   Hemoglobin 7.0 (L) 13.0 - 17.0 g/dL   HCT 22.0 (L) 39.0 - 52.0 %   MCV 83.0 78.0 - 100.0 fL   MCH 26.4 26.0 - 34.0 pg   MCHC 31.8 30.0 - 36.0 g/dL   RDW 17.5 (H) 11.5 - 15.5 %   Platelets 372 150 - 400 K/uL   Neutrophils Relative % 91 %   Lymphocytes Relative 3 %   Monocytes Relative 6 %   Eosinophils Relative 0 %   Basophils Relative 0 %   Neutro Abs 26.5 (H) 1.7 - 7.7 K/uL   Lymphs Abs 0.9 0.7 - 4.0 K/uL   Monocytes Absolute 1.7 (H) 0.1 - 1.0 K/uL   Eosinophils Absolute 0.0 0.0 - 0.7 K/uL   Basophils Absolute 0.0 0.0 - 0.1 K/uL  WBC Morphology INCREASED BANDS (>20% BANDS)     Comment: TOXIC GRANULATION  Comprehensive metabolic panel     Status: Abnormal   Collection Time: 08/27/16  9:50 AM  Result Value Ref Range   Sodium 127 (L) 135 - 145 mmol/L   Potassium 3.7 3.5 - 5.1 mmol/L   Chloride 104 101 - 111 mmol/L   CO2 20 (L) 22 - 32 mmol/L   Glucose, Bld 22 (LL) 65 - 99 mg/dL    Comment: REPEATED TO VERIFY CRITICAL RESULT CALLED TO, READ BACK BY AND VERIFIED WITH: B.GROGAN,RN 08/27/16 1053 BY BSLADE    BUN 40 (H) 6 - 20 mg/dL   Creatinine, Ser 0.70 0.61 - 1.24 mg/dL   Calcium 8.4 (L) 8.9 - 10.3 mg/dL   Total Protein 4.6 (L) 6.5 - 8.1 g/dL   Albumin 1.4 (L) 3.5 - 5.0 g/dL   AST 15 15 - 41 U/L   ALT 11 (L) 17 - 63 U/L   Alkaline Phosphatase 100 38 - 126 U/L   Total Bilirubin 0.2 (L) 0.3 - 1.2 mg/dL   GFR calc non Af Amer >60 >60 mL/min   GFR calc Af Amer >60 >60 mL/min    Comment: (NOTE) The eGFR has been calculated using the CKD EPI equation. This calculation has not been validated in all clinical situations. eGFR's persistently <60 mL/min signify possible Chronic Kidney Disease.    Anion gap 3 (L) 5 - 15  Magnesium      Status: None   Collection Time: 08/27/16  9:50 AM  Result Value Ref Range   Magnesium 1.9 1.7 - 2.4 mg/dL  Phosphorus     Status: Abnormal   Collection Time: 08/27/16  9:50 AM  Result Value Ref Range   Phosphorus <1.0 (LL) 2.5 - 4.6 mg/dL    Comment: REPEATED TO VERIFY CRITICAL RESULT CALLED TO, READ BACK BY AND VERIFIED WITH: B.GROGAN,RN 08/27/16 1053 BY BSLADE   Prepare RBC     Status: None   Collection Time: 08/27/16 11:03 AM  Result Value Ref Range   Order Confirmation ORDER PROCESSED BY BLOOD BANK   Glucose, capillary     Status: None   Collection Time: 08/27/16 11:26 AM  Result Value Ref Range   Glucose-Capillary 94 65 - 99 mg/dL   Comment 1 Notify RN    Comment 2 Document in Chart    *Note: Due to a large number of results and/or encounters for the requested time period, some results have not been displayed. A complete set of results can be found in Results Review.    MICRO:  IMAGING: Ct Abdomen Pelvis W Contrast  Result Date: 08/19/2016 CLINICAL DATA:  Shortness of Breath.  Blood in colostomy bag. EXAM: CT ABDOMEN AND PELVIS WITH CONTRAST TECHNIQUE: Multidetector CT imaging of the abdomen and pelvis was performed using the standard protocol following bolus administration of intravenous contrast. CONTRAST:  100 cc Isovue-300 IV COMPARISON:  12/10/2015 FINDINGS: Lower chest: The distal esophagus is filled with debris. Small bilateral pleural effusions. Dense consolidation in the lower lobes bilaterally, similar to prior study. Heart is normal size. Hepatobiliary: No focal hepatic abnormality. Gallbladder unremarkable. Pancreas: No focal abnormality or ductal dilatation. Spleen: No focal abnormality.  Normal size. Adrenals/Urinary Tract: No adrenal abnormality. No focal renal abnormality. No stones or hydronephrosis. Urinary bladder is decompressed with mild wall thickening. Suprapubic catheter in place. Stomach/Bowel: Left lower quadrant ostomy noted. No evidence of bowel  obstruction. Gastrostomy tube is in place within the stomach. Moderate debris/material  within the stomach. Vascular/Lymphatic: No evidence of aneurysm in the left periaortic region adjacent to the L2 level, there is irregular mixed density necrotic adenopathy versus periaortic abscess measuring 2.7 x 2.0 cm. Areas of high density noted within this periaortic process is well as the right psoas muscle of unknown etiology, possible calcifications from chronic process. IVC filter in place within the infrarenal IVC. Reproductive: No visible focal abnormality. Other: Small amount of free fluid adjacent to the decompressed urinary bladder. No free air. Musculoskeletal: Probable large decubitus ulcer noted over the sacrum and right posterior iliac bone. Underlying sclerosis within the right posterior iliac bone is increased since prior study and may reflect chronic osteomyelitis. There is gas and fluid noted within the adjacent right SI joint. This is new since prior study. This is most compatible with septic arthritis. The adjacent iliac bone demonstrates irregular lucency and bony destruction compatible with adjacent osteomyelitis. Fluid and gas extend into the right iliacus muscle as well as the iliopsoas and in the right psoas muscle superiorly compatible with intramuscular abscesses. Numerous abnormal vertebral bodies. Near complete destruction of the L2 vertebral body. Areas of lucency within L4, L1, T8 most compatible with multilevel osteomyelitis changes. Partial collapse and sclerosis in the L5 vertebral body, likely related to chronic osteomyelitis. 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 Evidence of septic arthritis within the right SI joint and acute osteomyelitis in the adjacent right iliac bone. Changes of multi level osteomyelitis throughout the vertebral bodies T8, L1, L2, L4, and L5. Intramuscular  abscesses involving the psoas, iliopsoas and iliacus muscles on the right. Mixed density complex process in the left periaortic space adjacent to the L2 vertebral body which could reflect necrotic and partially calcified adenopathy or periaortic abscess. Gastrostomy tube in expected position. Small bilateral pleural effusions. Dense consolidation in the lower lobes is stable since prior study and most compatible with pneumonia. Debris/ fluid filled dilated distal esophagus and stomach. These results were called by telephone at the time of interpretation on 08/21/2016 at 2:25 pm to Dr. Nanda Quinton , who verbally acknowledged these results. Electronically Signed   By: Rolm Baptise M.D.   On: 08/14/2016 14:29   Dg Chest Portable 1 View  Result Date: 08/22/2016 CLINICAL DATA:  Altered mental status. EXAM: PORTABLE CHEST 1 VIEW COMPARISON:  08/13/2016 FINDINGS: Tracheostomy tube and right PICC line remain in place, unchanged. There is mild cardiomegaly. Bilateral lower lobe airspace opacities, similar on the right prior study, increased on the left. No visible effusions. No acute bony abnormality. IMPRESSION: Bibasilar airspace opacities concerning for pneumonia, increasing on the left since prior study. Electronically Signed   By: Rolm Baptise M.D.   On: 08/09/2016 12:35    HISTORICAL MICRO/IMAGING  Assessment/Plan:  Daundre is a 30yo M with C6 paraplegia s/p PEG, neurogenic bladder with suprapubic catheter, sacral decub with osteomyelitis, admitted for LOWER GI bleed but also has marked leukocytosis which could be due to infectious causes in addition to GI Bleed. I question if GI bleed could be due to steroids (increased dose) when he was premedicated for Iv abtx  - HCAP - continue with aztreonam and can give vancomycin instead of linezolid. I have discussed with his mother that we will not premedicate with steroids since he has not been shown to be allergic to his medicaiton.  - diarrhea = could be due  to GIB, but would rule out cdifficile since he  is at high risk for acquisition given he has numerous exposure to abtx  - vertebral osteomyelitis= would broadly cover since this could be due to various agents that he has had recently since he has had group b strep bacteremia, or gram negative infection such as ecoli, or proteus. He has numerous abtx allergies, thus limited to giving vanco and aztreo.  Consider if IR could get tissue biopsy for culture for affected to help narrow abtx  Hyponatremia = likely due to hypovolemia, possibly contributing to encephalopathy

## 2016-08-27 NOTE — Progress Notes (Signed)
PROGRESS NOTE    Jason [name]  [id number] DOB: [birth year] DOA: 08/10/2016 PCP: [name], MD   Brief Narrative:  Jason [name] is a 30 y.o. 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 No apparent trauma to the abdomen recently. He did receive transfusions in the past, last one week ago during last admission. In addition, the patient has been increasingly lethargic, with decreased urine output over the last 3 days. Mother reports that the patient had large volume of watery diarrhea over the last  last 36 hours. No apparent fever or chills. There is no apparent bleeding from the tract site, or worsening breathing or increased respiratory secretions. He was admitted to SDU and Infectious Disease, Gastroenterology, and IR was consulted for further evaluation and management.   Assessment & Plan:   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 ([name])   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([place])   History of pulmonary embolism   DVT (deep venous thrombosis) ([name])   Severe protein-calorie malnutrition ([name])   Dysphagia, pharyngoesophageal phase   Decubitus ulcer of right perineal ischial region, stage 4 ([name])   Decubitus ulcer of left perineal ischial region, stage 4 ([name])   Sacral decubitus ulcer, stage IV ([name])   Multiple allergies   Recurrent UTI   Respiratory failure, chronic ([name])   Aspiration pneumonia of right lower lobe ([name])   Seizures ([name])   Anxiety state   Anemia of chronic disease   Hyponatremia  Sepsis likely Multifactorial Etiologies with Evidence of Septic  Arthritis within the Right SI Joint and Acute Osteomyelitis in the Adjacent Right illiac Bone with Changes of Multi Level Osteomyelitis throughout Vertebral Bodies of T8, L1, L2, L4, and L5 with Intramuscular Abscesses involving the Psoas, iliopsoas and iliacus muscles on the Right with Concomitant Dens Consolidation in the Lower lobes B/L compatible with PNA, poA -Patient with Temp of 94.8, WBC of 30.8, Tachycardia of 114, and Tachypenia of 24 with Hypotension -Procalcitonin was 1.95 and will trend -Patient was bolused 1 L of Saline because of Hypotension -C/w D5 NS at a rate of 100 mL/hr -Restarted Midodrine 20 mg TID -Infectious Disease Consultation  -Blood Cx Pending, Urine Cx Pending, MRAS PCR + -IV Abx with Vancomycin and Aztreonam per ID -Will discuss with IR for Tissue Biopsy in AM  HCAP with Recent Aspiration PNA -C/w Chlorhexidine Gluconate -Chronic Tracheostomy -C/w Albuterol Nebs 4 Times a day -C/w with Vancomycin and Aztreonam  Hypoglycemia in the Setting of Diabetes Mellitus Type 1 -Patient's BS was 22 this AM on BMP and 26 on CBG -Given AMP of D50 and started on D5 NS at a rate of 100 mL/hr -Has Sensitive Novolog SSI   Hyponatremia -Likely from Dehydration and Hypovolemia from Blood Loss -C/w IVF -Repeat BMP in AM  Hypotension -Gave 1 L of NS bolus and Transfusion of 2 units of pRBC's -Restarted Midodrine 20 mg po 3 times a daily with meals -Continue to Monitor  Watery Stools r/o C Difficile given recent Abx Usage -R/o C Difficile -Continue to Monitor stool output  Suspected Acute Lower GI Bleed and Blood Loss Anemia  in the Setting of Steroid Use -FOBT + -Hb/Hct went from 7.9/24.6 -> 7.0/22.0 -Type and Screen and Transfuse 2 units of pRBC's -GI Consulted for Evaluation and Management -? 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 C6 Spinal Cord Infarct complicated by Neurogenic Bladder and Chronic Large Sacral Ulcer -C/w Indwelling Foley and with Oxybutynin 5 mg per Peg Tube BID -C/w Pantoprazole 40 mg po Daily -IV Abx -C/w Baclofen 20 mg po 4 times daily  Large Sacral Decubitus Ulcers with Posterior iliac Bone with Chronic Osteomyelitis s/p Diverting Colostomy and Resection of Coccyx and Distal Sacrum -WOUND Nurse Recommendations appreciated -IV Abx with Aztreonam and Vancomycin  Hypophosphetemia -Repleted with Potassium Phosphate 40 mEQ  Leaking G Tube -IR Consulted and Replaced Gastric Bumper without complication and exchanged Tubing  Hx of PE s/p IVC Filter -Not on Anticoagulation   DVT prophylaxis: SCDs Code Status: FULL Family Communication: Discussed with Mother at bedside Disposition Plan: Remain in SDU today  Consultants:   Infectious Diseases  Gastroenterology  Interventional Radiology  Procedures: G Tube replacement  Antimicrobials: IV Vancomycin and IV Aztreonam  Subjective: Seen and examined this Am and was somnolent and not alert. BS was 26 on check so was given Amp of d50 and started on Continuous D5W with Ns. He woke up a little after. Per mom who was in the room, wanted the G tube evaluated as it was leaking. Called ID and GI for further evaluation.   Objective: Vitals:   08/27/16 1358 08/27/16 1400 08/27/16 1427 08/27/16 1442  BP: 106/61 99/63  96/64  Pulse: 85 85 85 87  Resp: 12 11 14 11   Temp: 98.5 F (36.9 C) 98.5 F (36.9 C)  98.8 F (37.1 C)  TempSrc: Axillary Axillary  Axillary  SpO2: 99% 99% 99% 98%  Weight:      Height:        Intake/Output Summary (Last 24 hours) at 08/27/16 1653 Last data filed at 08/27/16 1500  Gross per 24 hour  Intake          3115.34 ml  Output              200 ml  Net          2915.34 ml   Filed Weights   09/05/2016  1025  Weight: 54.9 kg (121 lb)    Examination: Physical Exam:  Constitutional: Thin ill appearing AAM who is somnolent and arousable to painful stimuli Eyes: lids normal but there is conjuctival pallor, sclerae anicteric  ENMT: External Ears, Nose appear normal. Grossly normal hearing.  Neck: Appears normal, supple, no cervical masses, normal ROM, no appreciable thyromegaly. Tracheostomy in place Respiratory: Diminished bilaterally, no wheezing, rales, rhonchi or crackles. Normal respiratory effort and patient is not tachypenic. No accessory muscle use.  Cardiovascular: RRR, no murmurs / rubs / gallops. S1 and S2 auscultated. No extremity edema. Abdomen: Soft, non-tender, non-distended. No masses palpated. No appreciable hepatosplenomegaly. Bowel sounds positive. PEG in place and Ostomy bag with mild blood.   GU: Suprapubic Catheter in place draining yellow urine.  Musculoskeletal: No clubbing / cyanosis of digits/nails. Mild Contractures Skin: Warm and Dry with Large Sacral ulcer and wounds on heels. Pink and dry on some.  Neurologic: Not awake or alert. Arousable to  painful stimuli Psychiatric: Unable to assess because of current condition.   Data Reviewed: I have personally reviewed following labs and imaging studies  CBC:  Recent Labs Lab 08/18/2016 1116 08/27/16 0950  WBC 30.8* 29.1*  NEUTROABS 27.4* 26.5*  HGB 7.9* 7.0*  HCT 24.6* 22.0*  MCV 83.1 83.0  PLT 503* [id number]   Basic Metabolic Panel:  Recent Labs Lab 09/05/2016 1116 08/27/16 0950  NA 123* 127*  K 4.2 3.7  CL 99* 104  CO2 20* 20*  GLUCOSE 125* 22*  BUN 40* 40*  CREATININE 0.76 0.70  CALCIUM 8.7* 8.4*  MG  --  1.9  PHOS  --  <1.0*   GFR: Estimated Creatinine Clearance: 102.9 mL/min (by C-G formula based on SCr of 0.7 mg/dL). Liver Function Tests:  Recent Labs Lab 08/19/2016 1116 08/27/16 0950  AST 14* 15  ALT 10* 11*  ALKPHOS 92 100  BILITOT 0.3 0.2*  PROT 4.9* 4.6*  ALBUMIN 1.5* 1.4*    Recent  Labs Lab 08/16/2016 1116  LIPASE 13   No results for input(s): AMMONIA in the last 168 hours. Coagulation Profile:  Recent Labs Lab 08/11/2016 1756  INR 1.24   Cardiac Enzymes: No results for input(s): CKTOTAL, CKMB, CKMBINDEX, TROPONINI in the last 168 hours. BNP (last 3 results) No results for input(s): PROBNP in the last 8760 hours. HbA1C: No results for input(s): HGBA1C in the last 72 hours. CBG:  Recent Labs Lab 08/27/16 0912 08/27/16 0942 08/27/16 1126 08/27/16 1450 08/27/16 1552  GLUCAP 26* 120* 94 115* 126*   Lipid Profile: No results for input(s): CHOL, HDL, LDLCALC, TRIG, CHOLHDL, LDLDIRECT in the last 72 hours. Thyroid Function Tests: No results for input(s): TSH, T4TOTAL, FREET4, T3FREE, THYROIDAB in the last 72 hours. Anemia Panel: No results for input(s): VITAMINB12, FOLATE, FERRITIN, TIBC, IRON, RETICCTPCT in the last 72 hours. Sepsis Labs:  Recent Labs Lab 08/30/2016 1127 08/27/2016 1756 09/04/2016 1757 08/23/2016 2018  PROCALCITON  --  1.95  --   --   LATICACIDVEN 0.62  --  0.6 0.8    Recent Results (from the past 240 hour(s))  Urine culture     Status: None (Preliminary result)   Collection Time: 08/11/2016 12:04 PM  Result Value Ref Range Status   Specimen Description URINE, SUPRAPUBIC  Final   Special Requests NONE  Final   Culture CULTURE REINCUBATED FOR BETTER GROWTH  Final   Report Status PENDING  Incomplete  Blood Culture (routine x 2)     Status: None (Preliminary result)   Collection Time: 08/12/2016 12:07 PM  Result Value Ref Range Status   Specimen Description BLOOD LEFT WRIST  Final   Special Requests BOTTLES DRAWN AEROBIC AND ANAEROBIC 10CC  Final   Culture NO GROWTH < 24 HOURS  Final   Report Status PENDING  Incomplete  Culture, blood (x 2)     Status: None (Preliminary result)   Collection Time: 08/14/2016  6:10 PM  Result Value Ref Range Status   Specimen Description BLOOD RIGHT HAND  Final   Special Requests IN PEDIATRIC BOTTLE 2CC   Final   Culture NO GROWTH < 24 HOURS  Final   Report Status PENDING  Incomplete  Culture, blood (x 2)     Status: None (Preliminary result)   Collection Time: 08/25/2016  6:14 PM  Result Value Ref Range Status   Specimen Description BLOOD RIGHT THUMB  Final   Special Requests IN PEDIATRIC BOTTLE 1CC  Final   Culture NO GROWTH < 24  HOURS  Final   Report Status PENDING  Incomplete  MRSA PCR Screening     Status: Abnormal   Collection Time: 08/27/16  3:50 AM  Result Value Ref Range Status   MRSA by PCR POSITIVE (A) NEGATIVE Final    Comment:        The GeneXpert MRSA Assay (FDA approved for NASAL specimens only), is one component of a comprehensive MRSA colonization surveillance program. It is not intended to diagnose MRSA infection nor to guide or monitor treatment for MRSA infections. RESULT CALLED TO, READ BACK BY AND VERIFIED WITH: EVERETTE,D RN 08/27/16 AT 0515 [facility]     Radiology Studies: Ct Abdomen Pelvis W Contrast  Result Date: 08/12/2016 CLINICAL DATA:  Shortness of Breath.  Blood in colostomy bag. EXAM: CT ABDOMEN AND PELVIS WITH CONTRAST TECHNIQUE: Multidetector CT imaging of the abdomen and pelvis was performed using the standard protocol following bolus administration of intravenous contrast. CONTRAST:  100 cc Isovue-300 IV COMPARISON:  12/10/2015 FINDINGS: Lower chest: The distal esophagus is filled with debris. Small bilateral pleural effusions. Dense consolidation in the lower lobes bilaterally, similar to prior study. Heart is normal size. Hepatobiliary: No focal hepatic abnormality. Gallbladder unremarkable. Pancreas: No focal abnormality or ductal dilatation. Spleen: No focal abnormality.  Normal size. Adrenals/Urinary Tract: No adrenal abnormality. No focal renal abnormality. No stones or hydronephrosis. Urinary bladder is decompressed with mild wall thickening. Suprapubic catheter in place. Stomach/Bowel: Left lower quadrant ostomy noted. No evidence of bowel  obstruction. Gastrostomy tube is in place within the stomach. Moderate debris/material within the stomach. Vascular/Lymphatic: No evidence of aneurysm in the left periaortic region adjacent to the L2 level, there is irregular mixed density necrotic adenopathy versus periaortic abscess measuring 2.7 x 2.0 cm. Areas of high density noted within this periaortic process is well as the right psoas muscle of unknown etiology, possible calcifications from chronic process. IVC filter in place within the infrarenal IVC. Reproductive: No visible focal abnormality. Other: Small amount of free fluid adjacent to the decompressed urinary bladder. No free air. Musculoskeletal: Probable large decubitus ulcer noted over the sacrum and right posterior iliac bone. Underlying sclerosis within the right posterior iliac bone is increased since prior study and may reflect chronic osteomyelitis. There is gas and fluid noted within the adjacent right SI joint. This is new since prior study. This is most compatible with septic arthritis. The adjacent iliac bone demonstrates irregular lucency and bony destruction compatible with adjacent osteomyelitis. Fluid and gas extend into the right iliacus muscle as well as the iliopsoas and in the right psoas muscle superiorly compatible with intramuscular abscesses. Numerous abnormal vertebral bodies. Near complete destruction of the L2 vertebral body. Areas of lucency within L4, L1, T8 most compatible with multilevel osteomyelitis changes. Partial collapse and sclerosis in the L5 vertebral body, likely related to chronic osteomyelitis. 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 Evidence of septic arthritis within the right SI joint and acute osteomyelitis in the adjacent right iliac bone. Changes of multi level osteomyelitis throughout the vertebral bodies T8, L1, L2, L4, and L5. Intramuscular  abscesses involving the psoas, iliopsoas and iliacus muscles on the right. Mixed density complex process in the left periaortic space adjacent to the L2 vertebral body which could reflect necrotic and partially calcified adenopathy or periaortic abscess. Gastrostomy tube in expected position. Small bilateral pleural effusions. Dense consolidation in the lower lobes is stable since prior study and  most compatible with pneumonia. Debris/ fluid filled dilated distal esophagus and stomach. These results were called by telephone at the time of interpretation on 08/30/2016 at 2:25 pm to Dr. Nanda Quinton , who verbally acknowledged these results. Electronically Signed   By: Rolm Baptise M.D.   On: 08/11/2016 14:29   Dg Chest Portable 1 View  Result Date: 08/12/2016 CLINICAL DATA:  Altered mental status. EXAM: PORTABLE CHEST 1 VIEW COMPARISON:  08/13/2016 FINDINGS: Tracheostomy tube and right PICC line remain in place, unchanged. There is mild cardiomegaly. Bilateral lower lobe airspace opacities, similar on the right prior study, increased on the left. No visible effusions. No acute bony abnormality. IMPRESSION: Bibasilar airspace opacities concerning for pneumonia, increasing on the left since prior study. Electronically Signed   By: Rolm Baptise M.D.   On: 08/13/2016 12:35   Scheduled Meds: . albuterol  2.5 mg Nebulization QID  . aztreonam  2 g Intravenous Once  . baclofen  20 mg Oral QID  . buPROPion  150 mg Oral Daily  . Chlorhexidine Gluconate Cloth  6 each Topical Q0600  . fentaNYL  75 mcg Transdermal Q72H  . ferrous sulfate  325 mg Oral Q breakfast  . hydrocortisone  10 mg Oral QPM  . hydrocortisone  20 mg Oral q morning - 10a  . insulin aspart  0-9 Units Subcutaneous TID WC  . midodrine  20 mg Oral TID WC  . mupirocin ointment  1 application Nasal BID  . oxybutynin  5 mg Per Tube BID  . pantoprazole  40 mg Oral Daily  . potassium phosphate IVPB (mEq)  40 mEq Intravenous Once  . pregabalin  100  mg Oral BID   Continuous Infusions: . dextrose 5 % and 0.45 % NaCl with KCl 10 mEq/L 100 mL/hr at 08/27/16 1016    LOS: 1 day   Kerney Elbe, DO [facility] Pager [phone]  If 7PM-7AM, please contact night-coverage [email or web address] Password [email or web address] 08/27/2016, 4:53 PM

## 2016-08-27 NOTE — Telephone Encounter (Signed)
Can we refill this? 

## 2016-08-27 NOTE — Progress Notes (Signed)
Patients BP 73/38, Temperature 94.8 rectally, Blood sugar 26 pt. Lethargic.  Updated Dr. Alfredia Ferguson on patients condition.  Physician to place orders.

## 2016-08-27 NOTE — Progress Notes (Addendum)
Initial Nutrition Assessment  DOCUMENTATION CODES:   Not applicable  INTERVENTION:    When able to resume TF, recommend continue nocturnal TF regimen as PTA: Glucerna 1.2via PEG at 44ml/h x 12 hours per day  (12 midnight to 12 noon) to provide 1008kcals (provides 58% of kcal needs), 50gm protein (provides 56% of protein needs), 668ml free water daily.    Diet advancement as able per Physician, recommend regular diet, same as PTA.   NUTRITION DIAGNOSIS:   Increased nutrient needs related to wound healing as evidenced by estimated needs.  GOAL:   Patient will meet greater than or equal to 90% of their needs  MONITOR:   PO intake, Labs, Weight trends, Skin, I & O's  REASON FOR ASSESSMENT:   Low [place]    ASSESSMENT:   30 y.o. 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  Pt is familiar to the RD team from previous admission. Last admission patient recieved Glucerna 1.2via PEG at 74ml/h x 12 hours per day  (12 midnight to 12 noon) to provide 1008kcals (provides 58% of kcal needs), 50gm protein (provides 56% of protein needs), 650ml free water daily.  Pt is currently on a clear liquid diet. ? GI bleeding, plans for colonoscopy soon. IR recommends exchange of PEG.  TF on hold for now.  Nutrition-Focused physical exam deferred at this time as pt with quadriplegia.   Labs reviewed: sodium and phosphorus are low. Medications reviewed and include ferrous sulfate and potassium phosphate.  Diet Order:  Diet clear liquid Room service appropriate? Yes; Fluid consistency: Thin  Skin:  Wound (see comment) (Stg 2 R elbow, back; stg 2 L heel; stg 4 coccyx, buttocks)  Last BM:  11/19  (ostomy)  Height:   Ht Readings from Last 1 Encounters:  08/25/2016 5\' 8"  (1.727 m)    Weight:   Wt Readings from Last 1 Encounters:  08/24/2016 121 lb (54.9 kg)    Ideal Body Weight:  63 kg (adjusted for quadriplegia)  BMI:  Body mass index is 18.4 kg/m.  Estimated Nutritional Needs:   Kcal:  1750-2000  Protein:  90-105 gm  Fluid:  2 L  EDUCATION NEEDS:   No education needs identified at this time  Molli Barrows, [place], [place], [place] Pager [phone] After Hours Pager [phone]

## 2016-08-27 NOTE — Progress Notes (Signed)
OT evaluation  PTA, pt lived at home with Mom/Dad and has PCA/nsg through [place] services. Mom states that pt was self feeding, using his cell phone and ipad and was assisting with his transfers and operating his power w/c before his problem with wounds. Apparently this has been @ 18 months with a gradual decline in function due to being "bed bound" due to wounds. Pt very lethargic during assessment and not following commands. RR 6 in supine. . At this time, recommend family bring in prevalon boots to reduce pressure on B heels. Will follow up with pt when more alert to determine any acute needs. Recommend family/nsg complete daily PROM x all extremities. Mom would like pt to have further therapy, however, pt has [facility] services and has Medicaid     08/27/16 1500  OT Visit Information  Last OT Received On 08/27/16  Assistance Needed +2  History of Present Illness Jason [name] is a 30 y.o. male admitted on [id number] with complicated PMH now presenting with sepsis from chronic osteomyelitis vs UTI vs new abscess. PMH: quadriplegia with resulting sacral decub, chronic trach, ostomy, suprapubic catheter, recurrent UTI, hx seizures, DM, gastroparesis, hx PE/DVT, anxiety, anemaia  Precautions  Precautions Other (comment);Fall  Precaution Comments multiple wounds, trach. suprapubic catheter  Other Brace/Splint has prevelon boots that mom is to bring in  Terry expects to be discharged to: Private residence  Living Arrangements Parent  Available Help at Discharge Family;Available 24 hours/day  Type of [place] One level  Home Equipment Wheelchair - power;Hospital bed [facility] cushion (30 yo old) hoyer lift; air overlay mattress)  Additional Comments O2; portable suction  Prior Function  Level of [place] Needs assistance  ADL's / [place] pt was self feeding with set up, used his touch phone bryshed his teeth,  operated his w/c   Comments Pt was cognitively intact until @ 1 week ago; pt was transferring himself with slideboard @ 18 mos ago with minguard A from parents. He was limited with mobility after that time to let wounds close. tilt n space w/c  (vendor ?) mOM STATES IT NEEDS TO BE REPAIRED; played video games  - instructs his nephews how to play bc he can't operate the controls. [place] 16 hrs/day to help with self care, etc.  Communication  Communication Tracheostomy;Passy-Muir valve  Pain Assessment  Pain Assessment Faces  Faces Pain Scale 0  Cognition  Arousal/Alertness Lethargic  Behavior During Therapy Flat affect  Overall Cognitive Status Impaired/Different from baseline  Upper Extremity Assessment  Upper Extremity Assessment RUE deficits/detail;LUE deficits/detail  RUE Deficits / Details to have tenodesis per conoversation with Mom (Will furher assess)  RUE Coordination decreased gross motor;decreased fine motor  LUE Deficits / Details moving spontaneiously. ? will further assess. contractures in fingers but per Mom, functioinal  Lower Extremity Assessment  Lower Extremity Assessment (multiple wounds. C6 quad)  Cervical / Trunk Assessment  Cervical / Trunk Assessment Other exceptions;Kyphotic (neck held in extension)  ADL  Overall ADL's  Needs assistance/impaired  Functional mobility during ADLs Total assistance  General ADL Comments total A with all ADL  Vision- History  Baseline Vision/History Retinopathy  Vision- Assessment  Vision Assessment? Vision impaired- to be further tested in functional context  Additional Comments Mom states  vision has gotten worse; needs new glasses  Bed Mobility  Overal bed mobility Needs Assistance;+2 for physical assistance  Bed Mobility Rolling  Rolling Total assist;+2 for  physical assistance  Transfers  General transfer comment requires use of hoyer  OT - End of Session  Activity Tolerance Patient limited by lethargy  Patient left in  bed;with call bell/phone within reach;with family/visitor present  Nurse Communication Mobility status;Other (comment) (plan of care)  OT Assessment  OT Recommendation/Assessment Patient needs continued OT Services  OT Problem List Decreased strength;Decreased range of motion;Decreased activity tolerance;Impaired balance (sitting and/or standing);Impaired vision/perception;Decreased coordination;Decreased cognition;Decreased safety awareness;Decreased knowledge of use of DME or AE;Impaired sensation;Impaired tone;Impaired UE functional use;Pain  OT Plan  OT Frequency (ACUTE ONLY) Min 2X/week  OT Treatment/Interventions (ACUTE ONLY) Self-care/ADL training;Therapeutic exercise;DME and/or AE instruction;Therapeutic activities;Cognitive remediation/compensation;Patient/family education;Balance training  OT Recommendation  Follow Up Recommendations Supervision/Assistance - 24 hour  OT Equipment None recommended by OT  Individuals Consulted  Consulted and Agree with Results and Recommendations Family member/caregiver (mom)  Family Member Consulted mom  Acute Rehab OT Goals  Patient Stated Goal unable to state. Per mom - to return to PLOF before he had his wounds  OT Goal Formulation With family  Time For Goal Achievement 09/10/16  Potential to Achieve Goals Fair  OT Time Calculation  OT Start Time (ACUTE ONLY) 1506  OT Stop Time (ACUTE ONLY) 1536  OT Time Calculation (min) 30 min  OT Evaluation  $OT Eval Moderate Complexity 1 Procedure  $OT Eval High Complexity 1 Procedure  Written Expression  Dominant Hand Left  Jason [name], OTR/L  [phone] 08/27/2016

## 2016-08-27 NOTE — Progress Notes (Signed)
Referring Physician(s): Dr Claybon Jabs  Supervising Physician: Aletta Edouard  Patient Status:  [facility] - In-pt  Chief Complaint:  G tube malfunction  Subjective:  Percutaneous gastric tube placed in IR 05/2015 Tip replaced and existing tubing flushed and brushed 07/11/2016. Did well for approx 1 month--now leaking again. MD requesting evaluation  Family member at bedside States this tubing has never been exchanged. Bumper will continually work its way away from the skin and cause leaking.  Tubing does show obvious signs of wear and partially obstructed with tube feeds  Allergies: Amikacin sulfate; Cefuroxime axetil; Ertapenem; Morphine and related; Nsaids; Penicillins; Sulfa antibiotics; Tessalon [benzonatate]; Levaquin [levofloxacin in d5w]; Rocephin [ceftriaxone]; Shellfish allergy; and Miripirium  Medications: Prior to Admission medications   Medication Sig Start Date End Date Taking? Authorizing Provider  acetaminophen (TYLENOL) 325 MG tablet Take 2 tablets (650 mg total) by mouth every 6 (six) hours as needed for mild pain, moderate pain, fever or headache. 01/14/15  Yes Geradine Girt, DO  baclofen (LIORESAL) 20 MG tablet Take 1 tablet (20 mg total) by mouth 4 (four) times daily. 06/26/16  Yes [name], MD  BISACODYL 5 MG EC tablet Take ONE (1) tablet by mouth twice daily as needed FOR MODERATE CONSTIPATION Patient taking differently: Take 5 MG by mouth twice daily as needed FOR MODERATE CONSTIPATION 06/19/16  Yes [name], MD  buPROPion (WELLBUTRIN XL) 150 MG 24 hr tablet Take 1 tablet (150 mg total) by mouth daily. 06/15/16  Yes [name], MD  camphor-menthol [facility]) lotion Apply topically as needed for itching. 09/02/15  Yes Shanker Kristeen Mans, MD  collagenase (SANTYL) ointment Apply 1 application topically 3 (three) times a week. Patient taking differently: Apply 1 application topically as needed (dressing change).  08/24/16  Yes [name], MD    dexlansoprazole (DEXILANT) 60 MG capsule Take 1 capsule (60 mg total) by mouth daily. 03/14/16  Yes [name], MD  docusate (COLACE) 50 MG/5ML liquid Take 10 mLs (100 mg total) by mouth 2 (two) times daily. Patient taking differently: Take 100 mg by mouth daily.  11/10/15  Yes [name], MD  fentaNYL (DURAGESIC - DOSED MCG/HR) 75 MCG/HR place 1 patch (50mcg total) onto the skin every 3 (three) days 08/24/16  Yes [name], MD  ferrous sulfate 325 (65 FE) MG tablet Take 1 tablet (325 mg total) by mouth daily with breakfast. 09/27/15  Yes [name], MD  glucagon (GLUCAGON EMERGENCY) 1 MG injection INJECT INTO MUSCLE ONCE AS NEEDED Patient taking differently: Inject 1 mg into the muscle once as needed (hypoglycemia). INJECT INTO MUSCLE ONCE AS NEEDED 07/05/15  Yes Renato Shin, MD  GLUCERNA (GLUCERNA) LIQD 1,000 mLs by PEG Tube route continuous. 70 ml/hr   Yes Historical Provider, MD  hydrocortisone (CORTEF) 10 MG tablet Take 1 tablet (10 mg total) by mouth every evening. Hold for the next 5 days, while using solumedrol. Then resume as previously instructed 08/18/16  Yes Verlee Monte, MD  HYDROmorphone (DILAUDID) 8 MG tablet Take ONE (1) tablet (8mg  total) by mouth every 6 hours as needed for severe pain 08/24/16  Yes [name], MD  insulin aspart (NOVOLOG FLEXPEN) 100 UNIT/ML FlexPen Inject 0-10 Units into the skin 4 (four) times daily as needed for high blood sugar (CBG >200). Per sliding scale: CBG <200 = 0 units, 201-300 = 4 units, 301-400 = 6 units, >400 = 10 units   Yes Historical Provider, MD  insulin glargine (LANTUS) 100 UNIT/ML injection Inject 0.22 mLs (22 Units total) into the skin at bedtime. Patient taking differently: Inject 22 Units into the skin every morning.  07/31/16  Yes Barton Dubois, MD  loratadine (CLARITIN) 10 MG tablet Take 1 tablet (10 mg total) by mouth daily. 11/25/15  Yes [name], MD  LYRICA 100 MG capsule Take ONE (1)  capsule by mouth three times daily  (morning,afternoon and evening) and 2 capsules at bedtime Patient taking differently: Take 100 mg by mouth three times a day (morning/afternoon/evening) and 200 mg at bedtime 05/25/16  Yes [name], MD  Magnesium Oxide 400 (240 Mg) MG TABS Take ONE (1) tablet by mouth twice daily Patient taking differently: Take 400 MG by mouth twice daily 08/09/16  Yes [name], MD  midodrine (PROAMATINE) 10 MG tablet Take 2 tablets (20 mg total) by mouth 3 (three) times daily with meals. Patient taking differently: Take 40 mg by mouth daily as needed.  10/28/15  Yes [name], MD  Multiple Vitamin (MULTIVITAMIN WITH MINERALS) TABS tablet Place 1 tablet into feeding tube daily. 06/01/15  Yes Erick Colace, NP  Omeprazole-Sodium Bicarbonate (ZEGERID OTC PO) Take 1 tablet by mouth as needed (for acid reflux or heartburn).   Yes Historical Provider, MD  ondansetron (ZOFRAN ODT) 8 MG disintegrating tablet Place 1 tablet (8 mg total) into feeding tube every 8 (eight) hours as needed for nausea or vomiting. 06/01/15  Yes Erick Colace, NP  oxybutynin (DITROPAN) 5 MG tablet Place 1 tablet (5 mg total) into feeding tube 2 (two) times daily. 08/23/16  Yes [name], MD  Oxycodone HCl 20 MG TABS Take 1 tablet (20 mg total) by mouth every 6 (six) hours as needed (pain). 08/24/16  Yes [name], MD  VOLTAREN 1 % GEL apply 2 grams topically twice a day Patient taking differently: Apply 2 grams topically twice a day as needed for shoulder pain 04/24/16  Yes [name], MD  Water For Irrigation, Sterile (FREE WATER) SOLN Place 200 mLs into feeding tube every 8 (eight) hours. Patient taking differently: Place 300-400 mLs into feeding tube every 8 (eight) hours.  10/11/15  Yes [name], MD  albuterol (PROVENTIL) (2.5 MG/3ML) 0.083% nebulizer solution Take 3 mLs (2.5 mg total) by nebulization 4 (four) times daily. 08/03/16   [name], MD  famotidine (PEPCID) 10 MG tablet Take 1 tablet (10 mg total) by  mouth every 8 (eight) hours. Patient not taking: Reported on 08/14/2016 08/18/16   Verlee Monte, MD  famotidine (PEPCID) 20 MG tablet Take 1 tablet (20 mg total) by mouth 2 (two) times daily. Take around antibiotics infusion time Patient not taking: Reported on 09/01/2016 07/31/16   Barton Dubois, MD  hydrocortisone (CORTEF) 20 MG tablet Take 1 tablet (20 mg total) by mouth every morning. Hold for the next 5 days, while on solumedrol. Resume after this time as previously 08/18/16   Verlee Monte, MD  LORazepam (ATIVAN) 0.5 MG tablet Take 1 tablet (0.5 mg total) by mouth 3 (three) times daily as needed for anxiety. Reported on 11/02/2015 Patient taking differently: Take 0.25-0.5 mg by mouth 3 (three) times daily as needed for anxiety. Reported on 11/02/2015 06/20/16   [name], MD  mupirocin ointment Drue Stager) 2 % Apply to affected area twice a day as needed 06/19/16   [name], MD     Vital Signs: BP (!) 82/50 (BP Location: Left  Arm)   Pulse 76   Temp (!) 96.3 F (35.7 C) (Axillary)   Resp 15   Ht 5\' 8"  (1.727 m)   Wt 121 lb (54.9 kg)   SpO2 100%   BMI 18.40 kg/m   Physical Exam  Abdominal: Soft. Bowel sounds are normal.  Skin: Skin is warm.  g tube intact Bumper approx 4 cm away from skin site. Many 4x4 gauze in place.  Site is clean and dry; although with evidence of skin changes from bumper and leaking.     Nursing note and vitals reviewed.  Replaced bumper to be flush to skin RN did inject 25 cc sterile water into tubing without sign of leakage. Tubing is old and show obvious signs of wear. Partially obstructed with tube feeds    Imaging: Ct Abdomen Pelvis W Contrast  Result Date: 08/31/2016 CLINICAL DATA:  Shortness of Breath.  Blood in colostomy bag. EXAM: CT ABDOMEN AND PELVIS WITH CONTRAST TECHNIQUE: Multidetector CT imaging of the abdomen and pelvis was performed using the standard protocol following bolus administration of intravenous contrast.  CONTRAST:  100 cc Isovue-300 IV COMPARISON:  12/10/2015 FINDINGS: Lower chest: The distal esophagus is filled with debris. Small bilateral pleural effusions. Dense consolidation in the lower lobes bilaterally, similar to prior study. Heart is normal size. Hepatobiliary: No focal hepatic abnormality. Gallbladder unremarkable. Pancreas: No focal abnormality or ductal dilatation. Spleen: No focal abnormality.  Normal size. Adrenals/Urinary Tract: No adrenal abnormality. No focal renal abnormality. No stones or hydronephrosis. Urinary bladder is decompressed with mild wall thickening. Suprapubic catheter in place. Stomach/Bowel: Left lower quadrant ostomy noted. No evidence of bowel obstruction. Gastrostomy tube is in place within the stomach. Moderate debris/material within the stomach. Vascular/Lymphatic: No evidence of aneurysm in the left periaortic region adjacent to the L2 level, there is irregular mixed density necrotic adenopathy versus periaortic abscess measuring 2.7 x 2.0 cm. Areas of high density noted within this periaortic process is well as the right psoas muscle of unknown etiology, possible calcifications from chronic process. IVC filter in place within the infrarenal IVC. Reproductive: No visible focal abnormality. Other: Small amount of free fluid adjacent to the decompressed urinary bladder. No free air. Musculoskeletal: Probable large decubitus ulcer noted over the sacrum and right posterior iliac bone. Underlying sclerosis within the right posterior iliac bone is increased since prior study and may reflect chronic osteomyelitis. There is gas and fluid noted within the adjacent right SI joint. This is new since prior study. This is most compatible with septic arthritis. The adjacent iliac bone demonstrates irregular lucency and bony destruction compatible with adjacent osteomyelitis. Fluid and gas extend into the right iliacus muscle as well as the iliopsoas and in the right psoas muscle superiorly  compatible with intramuscular abscesses. Numerous abnormal vertebral bodies. Near complete destruction of the L2 vertebral body. Areas of lucency within L4, L1, T8 most compatible with multilevel osteomyelitis changes. Partial collapse and sclerosis in the L5 vertebral body, likely related to chronic osteomyelitis. 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 Evidence of septic arthritis within the right SI joint and acute osteomyelitis in the adjacent right iliac bone. Changes of multi level osteomyelitis throughout the vertebral bodies T8, L1, L2, L4, and L5. 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 Mixed density complex process in the left periaortic space adjacent to the L2 vertebral body which could reflect necrotic and partially  calcified adenopathy or periaortic abscess. Gastrostomy tube in expected position. Small bilateral pleural effusions. Dense consolidation in the lower lobes is stable since prior study and most compatible with pneumonia. Debris/ fluid filled dilated distal esophagus and stomach. These results were called by telephone at the time of interpretation on 08/22/2016 at 2:25 pm to Dr. Nanda Quinton , who verbally acknowledged these results. Electronically Signed   By: Rolm Baptise M.D.   On: 09/04/2016 14:29   Dg Chest Portable 1 View  Result Date: 08/22/2016 CLINICAL DATA:  Altered mental status. EXAM: PORTABLE CHEST 1 VIEW COMPARISON:  08/13/2016 FINDINGS: Tracheostomy tube and right PICC line remain in place, unchanged. There is mild cardiomegaly. Bilateral lower lobe airspace opacities, similar on the right prior study, increased on the left. No visible effusions. No acute bony abnormality. IMPRESSION: Bibasilar airspace opacities concerning for pneumonia, increasing on the left since prior study. Electronically Signed   By: Rolm Baptise  M.D.   On: 08/16/2016 12:35    Labs:  CBC:  Recent Labs  08/17/16 0500 08/18/16 0450 08/11/2016 1116 08/27/16 0950  WBC 24.6* 21.3* 30.8* 29.1*  HGB 8.8* 9.4* 7.9* 7.0*  HCT 28.2* 30.5* 24.6* 22.0*  PLT 466* 545* 503* 372    COAGS:  Recent Labs  09/02/2016 1756  INR 1.24  APTT 58*    BMP:  Recent Labs  08/17/16 0500 08/18/16 0450 08/12/2016 1116 08/27/16 0950  NA 136 133* 123* 127*  K 4.4 5.0 4.2 3.7  CL 107 103 99* 104  CO2 23 23 20* 20*  GLUCOSE 139* 182* 125* 22*  BUN 20 19 40* 40*  CALCIUM 8.7* 8.7* 8.7* 8.4*  CREATININE 0.52* 0.56* 0.76 0.70  GFRNONAA >60 >60 >60 >60  GFRAA >60 >60 >60 >60    LIVER FUNCTION TESTS:  Recent Labs  08/09/16 2105 08/12/16 0500 09/05/2016 1116 08/27/16 0950  BILITOT 0.5 0.3 0.3 0.2*  AST 13* 13* 14* 15  ALT 8* 9* 10* 11*  ALKPHOS 110 106 92 100  PROT 6.5 6.3* 4.9* 4.6*  ALBUMIN 1.8* 1.5* 1.5* 1.4*    Assessment and Plan:  Gastric tube bumper away from skin Replaced without complication---no leaking noted Tubing is old and partially obstructed with tube feeds Recommend exchange today   Electronically Signed: Tatisha Cerino A 08/27/2016, 12:09 PM   I spent a total of 15 Minutes at the the patient's bedside AND on the patient's hospital floor or unit, greater than 50% of which was counseling/coordinating care for gastric tube exchange

## 2016-08-27 NOTE — Progress Notes (Addendum)
Inpatient Diabetes Program Recommendations  AACE/ADA: New Consensus Statement on Inpatient Glycemic Control (2015)  Target Ranges:  Prepandial:   less than 140 mg/dL      Peak postprandial:   less than 180 mg/dL (1-2 hours)      Critically ill patients:  140 - 180 mg/dL   Lab Results  Component Value Date   GLUCAP 26 (LL) 08/27/2016   HGBA1C 8.0 (H) 07/04/2016   Review of Glycemic Control  Diabetes history: DM1 Outpatient Diabetes medications: Lantus 22 units QHS, Novolog 0-10 units QID Current orders for Inpatient glycemic control: Novolog Sensitive TID  Inpatient Diabetes Program Recommendations:   Noted hypoglycemia this am of 26 mg/dl. Patient has DM type 1 and will need basal insulin to prevent from going into DKA when glucose trends increase. Please consider part of patient's home dose of basal insulin to start tonight or in the am, Lantus 8 units.  Thanks,  Tama Headings RN, MSN, [facility] Inpatient Diabetes Coordinator Team Pager [phone] (8a-5p)

## 2016-08-27 NOTE — Consult Note (Signed)
[facility] Consult: 11:11 AM 08/27/2016  LOS: 1 day    Referring Provider: Dr Alfredia Ferguson  Primary Care Physician:  [name], MD Primary Gastroenterologist:  Dr. Hilarie Fredrickson, no office visits since 05/2014.      Reason for Consultation:  Bleeding per ostomy and leaking feeding tube.    HPI: Jason [name] is a 30 y.o. male.  PMH type 1 DM, historically poorly controlled with retinopathy, neuropathy, gastroparesis, recurrent DKA.  Gastroparesis complicated by chronic pain and narcotics.   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  S/p 10/2014 diverting colostomy to manage sacral decubitus, chronic osteomyelitis.  S/p 05/2015 G tube placement per IR at time of acute resp failure requiring tracheostomy and FEES showing dysphagia/aspiration. G tube was cleared of debris/occlusion 07/11/16.  S/p multiple surgeries for decubiti, including resection of coccyx and distal sacrum. PE 02/2015, S/p IVC filter 05/2015.  S/p suprapubic catheter.  Chronic and acute episodes of resp failure, pt requiring multiple admissions intubations/vent support, s/p tracheostomy 05/2015.   Longstanding severe malnutrition.  Encephalopathy, AKI  12/2015.  Chronic anemia on po iron.  Chronic adrenal insufficiency.  Polypharmacy.   04/2013 EGD for n/v, epigastric pain.  Retained food in stomach.  Gastric bx positive for H Pylori which was treated.    Admission 10/17 - 08/01/16 with SIRS, strep bacteremia, toxic metabollic encephalopthy, adrenal insufficiency.  At discharge his daily Dexilant was discontinued, replaced by BID Pepcid and pt started on Solumedrol to accompany and maximize new abx: Cefepime in pt with multiple cefalosporin type abx allergies.  Admission 11/2 - 08/18/16 with recurrent PNA, likely due to aspiration.  11/9 SLP eval  approved him again for regular diet, with thin liquids (same as in 12/2015.   Transfused PRBC x 2 for Hgb nadir 6.7,  9.4 on 11/11.   Hgb regularly in 7 to 8 range.   FOBT negative 10/17, 10/18 but positive 09/04/2016.   Several watery stools, volume 350 to 800 ml, dark brown watery and not bloody on Sat 1/18.  Sunday AM developed blood per ostomy. Volume has not been large but persists through now.  Stool is watery, deep red: not melenic. FOBT +.  Mom says pt c/o burning abdominal pain yesterday that radiated to chest, she thought it was his GERD. In ED Hgb 7.9, today 7.  2 PRBCs ordered.  WBCs to 30K with increased band cells. PT/INR 15.6/1.2.  leukosuria and funguria (later is chronic).  CT scan 11/19: large sacral decubitus, iliac with osteomyelitis of SI joint, T8 - L5.  Psoas, iliopsoas and iliacus muscle abscesses.  Stable lower lobe PNA,  Debris, fluid in distal esophagus and stomach.   For several weeks has had non-bloody discharge from G tube site that is causing local skin irritation.  This happened before and resolved when IR "snugged" the external bumper.  IR PA was just in pt's room and again snugged the bumper.  Has been tolerating Glucerna TFs.  Currently on clears due to the bleeding and po Protonix in place.  Other issues include hypoglycemia, hypotension, hypothermia.   Marland Kitchen  Past Medical History:  Diagnosis Date  . Anemia   . Anxiety   . Arthritis   . Asthma   . Chronic osteomyelitis involving pelvic region and thigh ([place]) 06/18/2016  . Colostomy in place [facility])   . Complication of anesthesia    difficulty waking up, will have sudden drop in BP and O2 sats  . Depression   . Diabetic neuropathy ([place])   . Diabetic retinopathy ([place])   . Dysrhythmia    usually due to medications  . Family history of anesthesia complication    Pt mother can't have epidural procedures  . Fibromyalgia   . Gastroparesis   . GERD (gastroesophageal reflux disease)   . H/O constipation   .  H/O respiratory failure   . H/O thrombosis   . Hx MRSA infection    on face  . Multiple allergies 11/02/2015  . Pneumonia   . Recurrent UTI 11/02/2015  . Seizures ([place]) 2017   one time during admission in hosptital  . Stroke ([place]) 01/29/2013   spinal stroke ; paraplegic   . Syncope 02/16/2015  . Tracheostomy present ([place])   . Type I diabetes mellitus ([place])    sees Dr. Loanne Drilling     Past Surgical History:  Procedure Laterality Date  . APPLICATION OF A-CELL OF BACK N/A 11/12/2014   Procedure: APPLICATION A CELL AND VAC ;  Surgeon: Theodoro Kos, DO;  Location: [place];  Service: Plastics;  Laterality: N/A;  . APPLICATION OF A-CELL OF BACK N/A 12/08/2014   Procedure: APPLICATION OF A-CELL AND WOUND VAC ;  Surgeon: Theodoro Kos, DO;  Location: [place];  Service: Plastics;  Laterality: N/A;  . APPLICATION OF A-CELL OF CHEST/ABDOMEN N/A 08/04/2015   Procedure: APPLICATION OF A-CELL ;  Surgeon: Loel Lofty Dillingham, DO;  Location: [place];  Service: Plastics;  Laterality: N/A;  . COLOSTOMY    . DEBRIDMENT OF DECUBITUS ULCER N/A 10/04/2014   Procedure: DEBRIDMENT OF DECUBITUS ULCER;  Surgeon: Georganna Skeans, MD;  Location: [place];  Service: General;  Laterality: N/A;  . GASTROSTOMY TUBE PLACEMENT    . HEMATOMA EVACUATION N/A 05/05/2015   Procedure: EVACUATION HEMATOMA bedside procedure;  Surgeon: Theodoro Kos, DO;  Location: [place];  Service: Plastics;  Laterality: N/A;  . INCISION AND DRAINAGE OF WOUND N/A 11/12/2014   Procedure: IRRIGATION AND DEBRIDEMENT OF WOUNDS WITH BONE BIOPSY AND SURGICAL PREP ;  Surgeon: Theodoro Kos, DO;  Location: [place];  Service: Plastics;  Laterality: N/A;  . INCISION AND DRAINAGE OF WOUND N/A 11/18/2014   Procedure: IRRIGATION AND DEBRIDEMENT OF SACRAL ULCER ONLY WITH PLACEMENT OF A CELL AND VAC/ DRESSING CHANGE TO UPPER BACK AREA.;  Surgeon: Theodoro Kos, DO;  Location: [place];  Service: Plastics;  Laterality: N/A;  . INCISION AND DRAINAGE OF WOUND N/A 11/25/2014   Procedure:  IRRIGATION AND DEBRIDEMENT OF SACRAL ULCER AND BACK BURN WITH PLACEMENT OF A-CELL;  Surgeon: Theodoro Kos, DO;  Location: [place];  Service: Plastics;  Laterality: N/A;  . INCISION AND DRAINAGE OF WOUND Right 12/08/2014   Procedure: IRRIGATION AND DEBRIDEMENT SACRAL WOUND AND RIGHT ISCHIAL WOUND ;  Surgeon: Theodoro Kos, DO;  Location: [place];  Service: Plastics;  Laterality: Right;  . INCISION AND DRAINAGE OF WOUND Bilateral 05/05/2015   Procedure: IRRIGATION AND DEBRIDEMENT SACRAL ULCER;  Surgeon: Theodoro Kos, DO;  Location: [place];  Service: Plastics;  Laterality: Bilateral;  . INCISION AND DRAINAGE OF WOUND N/A 08/04/2015   Procedure: IRRIGATION AND DEBRIDEMENT OF SACRAL ULCER  AND ;  Surgeon: Loel Lofty Dillingham, DO;  Location: [place];  Service: Plastics;  Laterality: N/A;  . INSERTION OF SUPRAPUBIC CATHETER N/A 10/12/2014   Procedure: INSERTION OF SUPRAPUBIC CATHETER;  Surgeon: Reece Packer, MD;  Location: [place];  Service: Urology;  Laterality: N/A;  . IR GENERIC HISTORICAL  07/11/2016   IR CM INJ ANY COLONIC TUBE W/FLUORO 07/11/2016 Arne Cleveland, MD [facility]-INTERV RAD  . LAPAROSCOPIC DIVERTED COLOSTOMY N/A 10/12/2014   Procedure: LAPAROSCOPIC DIVERTING COLOSTOMY;  Surgeon: Donnie Mesa, MD;  Location: [place];  Service: General;  Laterality: N/A;  . MINOR APPLICATION OF WOUND VAC N/A 11/25/2014   Procedure:  WOUND VAC CHANGE;  Surgeon: Theodoro Kos, DO;  Location: [place];  Service: Plastics;  Laterality: N/A;  . MULTIPLE EXTRACTIONS WITH ALVEOLOPLASTY N/A 08/03/2014   Procedure: MULTIPLE EXTRACTIONS;  Surgeon: Gae Bon, DDS;  Location: [place];  Service: Oral Surgery;  Laterality: N/A;  . RADIOLOGY WITH ANESTHESIA N/A 07/05/2016   Procedure: RADIOLOGY WITH ANESTHESIA MRI OF PELVIS WITH AND WITHOUT;  Surgeon: Medication Radiologist, MD;  Location: [place];  Service: Radiology;  Laterality: N/A;  . TEE WITHOUT CARDIOVERSION N/A 08/17/2014   Procedure: TRANSESOPHAGEAL ECHOCARDIOGRAM (TEE);  Surgeon:  Dorothy Spark, MD;  Location: [place];  Service: Cardiovascular;  Laterality: N/A;  . TEE WITHOUT CARDIOVERSION N/A 05/05/2015   Procedure: TRANSESOPHAGEAL ECHOCARDIOGRAM (TEE);  Surgeon: Lelon Perla, MD;  Location: [facility] ENDOSCOPY;  Service: Cardiovascular;  Laterality: N/A;  . TONSILLECTOMY      Prior to Admission medications   Medication Sig Start Date End Date Taking? Authorizing Provider  acetaminophen (TYLENOL) 325 MG tablet Take 2 tablets (650 mg total) by mouth every 6 (six) hours as needed for mild pain, moderate pain, fever or headache. 01/14/15  Yes Geradine Girt, DO  baclofen (LIORESAL) 20 MG tablet Take 1 tablet (20 mg total) by mouth 4 (four) times daily. 06/26/16  Yes [name], MD  BISACODYL 5 MG EC tablet Take ONE (1) tablet by mouth twice daily as needed FOR MODERATE CONSTIPATION Patient taking differently: Take 5 MG by mouth twice daily as needed FOR MODERATE CONSTIPATION 06/19/16  Yes [name], MD  buPROPion (WELLBUTRIN XL) 150 MG 24 hr tablet Take 1 tablet (150 mg total) by mouth daily. 06/15/16  Yes [name], MD  camphor-menthol [facility]) lotion Apply topically as needed for itching. 09/02/15  Yes Shanker Kristeen Mans, MD  collagenase (SANTYL) ointment Apply 1 application topically 3 (three) times a week. Patient taking differently: Apply 1 application topically as needed (dressing change).  08/24/16  Yes [name], MD  dexlansoprazole (DEXILANT) 60 MG capsule Take 1 capsule (60 mg total) by mouth daily. 03/14/16  Yes [name], MD  docusate (COLACE) 50 MG/5ML liquid Take 10 mLs (100 mg total) by mouth 2 (two) times daily. Patient taking differently: Take 100 mg by mouth daily.  11/10/15  Yes [name], MD  fentaNYL (DURAGESIC - DOSED MCG/HR) 75 MCG/HR place 1 patch (32mcg total) onto the skin every 3 (three) days 08/24/16  Yes [name], MD  ferrous sulfate 325 (65 FE) MG tablet Take 1 tablet (325 mg total) by mouth daily with breakfast. 09/27/15   Yes [name], MD  glucagon (GLUCAGON EMERGENCY) 1 MG injection INJECT INTO MUSCLE ONCE AS NEEDED Patient taking differently: Inject 1 mg into the muscle once as needed (hypoglycemia). INJECT INTO MUSCLE ONCE AS NEEDED 07/05/15  Yes Renato Shin, MD  GLUCERNA (GLUCERNA) LIQD 1,000 mLs by PEG Tube route continuous. 70 ml/hr   Yes Historical Provider, MD  hydrocortisone (CORTEF) 10 MG tablet Take 1 tablet (10 mg total) by mouth every evening. Hold for the next 5 days, while using solumedrol. Then resume as previously instructed 08/18/16  Yes Verlee Monte, MD  HYDROmorphone (DILAUDID) 8 MG tablet Take ONE (1) tablet (8mg  total) by mouth every 6 hours as needed for severe pain 08/24/16  Yes [name], MD  insulin aspart (NOVOLOG FLEXPEN) 100 UNIT/ML FlexPen Inject 0-10 Units into the skin 4 (four) times daily as needed for high blood sugar (CBG >200). Per sliding scale: CBG <200 = 0 units, 201-300 = 4 units, 301-400 = 6 units, >400 = 10 units   Yes Historical Provider, MD  insulin glargine (LANTUS) 100 UNIT/ML injection Inject 0.22 mLs (22 Units total) into the skin at bedtime. Patient taking differently: Inject 22 Units into the skin every morning.  07/31/16  Yes Barton Dubois, MD  loratadine (CLARITIN) 10 MG tablet Take 1 tablet (10 mg total) by mouth daily. 11/25/15  Yes [name], MD  LYRICA 100 MG capsule Take ONE (1)  capsule by mouth three times daily (morning,afternoon and evening) and 2 capsules at bedtime Patient taking differently: Take 100 mg by mouth three times a day (morning/afternoon/evening) and 200 mg at bedtime 05/25/16  Yes [name], MD  Magnesium Oxide 400 (240 Mg) MG TABS Take ONE (1) tablet by mouth twice daily Patient taking differently: Take 400 MG by mouth twice daily 08/09/16  Yes [name], MD  midodrine (PROAMATINE) 10 MG tablet Take 2 tablets (20 mg total) by mouth 3 (three) times daily with meals. Patient taking differently: Take 40 mg by mouth daily as  needed.  10/28/15  Yes [name], MD  Multiple Vitamin (MULTIVITAMIN WITH MINERALS) TABS tablet Place 1 tablet into feeding tube daily. 06/01/15  Yes Erick Colace, NP  Omeprazole-Sodium Bicarbonate (ZEGERID OTC PO) Take 1 tablet by mouth as needed (for acid reflux or heartburn).   Yes Historical Provider, MD  ondansetron (ZOFRAN ODT) 8 MG disintegrating tablet Place 1 tablet (8 mg total) into feeding tube every 8 (eight) hours as needed for nausea or vomiting. 06/01/15  Yes Erick Colace, NP  oxybutynin (DITROPAN) 5 MG tablet Place 1 tablet (5 mg total) into feeding tube 2 (two) times daily. 08/23/16  Yes [name], MD  Oxycodone HCl 20 MG TABS Take 1 tablet (20 mg total) by mouth every 6 (six) hours as needed (pain). 08/24/16  Yes [name], MD  VOLTAREN 1 % GEL apply 2 grams topically twice a day Patient taking differently: Apply 2 grams topically twice a day as needed for shoulder pain 04/24/16  Yes [name], MD  Water For Irrigation, Sterile (FREE WATER) SOLN Place 200 mLs into feeding tube every 8 (eight) hours. Patient taking differently: Place 300-400 mLs into feeding tube every 8 (eight) hours.  10/11/15  Yes [name], MD  albuterol (PROVENTIL) (2.5 MG/3ML) 0.083% nebulizer solution Take 3 mLs (2.5 mg total) by nebulization 4 (four) times daily. 08/03/16   [name], MD  famotidine (PEPCID) 10 MG tablet Take 1 tablet (10 mg total) by mouth every 8 (eight) hours. Patient not taking: Reported on 08/09/2016 08/18/16   Verlee Monte, MD  famotidine (PEPCID) 20 MG tablet Take 1 tablet (20 mg total) by mouth 2 (two) times daily. Take around antibiotics infusion  time Patient not taking: Reported on 09/05/2016 07/31/16   Barton Dubois, MD  hydrocortisone (CORTEF) 20 MG tablet Take 1 tablet (20 mg total) by mouth every morning. Hold for the next 5 days, while on solumedrol. Resume after this time as previously 08/18/16   Verlee Monte, MD  LORazepam (ATIVAN) 0.5 MG tablet Take 1  tablet (0.5 mg total) by mouth 3 (three) times daily as needed for anxiety. Reported on 11/02/2015 Patient taking differently: Take 0.25-0.5 mg by mouth 3 (three) times daily as needed for anxiety. Reported on 11/02/2015 06/20/16   [name], MD  mupirocin ointment Drue Stager) 2 % Apply to affected area twice a day as needed 06/19/16   [name], MD    Scheduled Meds: . sodium chloride   Intravenous Once  . albuterol  2.5 mg Nebulization QID  . baclofen  20 mg Oral QID  . buPROPion  150 mg Oral Daily  . Chlorhexidine Gluconate Cloth  6 each Topical Q0600  . fentaNYL  75 mcg Transdermal Q72H  . ferrous sulfate  325 mg Oral Q breakfast  . hydrocortisone  10 mg Oral QPM  . hydrocortisone  20 mg Oral q morning - 10a  . insulin aspart  0-9 Units Subcutaneous TID WC  . linezolid  600 mg Oral Q12H  . midodrine  20 mg Oral TID WC  . mupirocin ointment  1 application Nasal BID  . oxybutynin  5 mg Per Tube BID  . pantoprazole  40 mg Oral Daily  . potassium phosphate IVPB (mEq)  40 mEq Intravenous Once  . pregabalin  100 mg Oral BID   Infusions: . dextrose 5 % and 0.45 % NaCl with KCl 10 mEq/L 100 mL/hr at 08/27/16 1016   PRN Meds: bisacodyl, LORazepam, traZODone   Allergies as of 08/19/2016 - Review Complete 09/06/2016  Allergen Reaction Noted  . Amikacin sulfate Other (See Comments) 05/09/2016  . Cefuroxime axetil Anaphylaxis 05/29/2007  . Ertapenem Other (See Comments) 12/02/2014  . Morphine and related Other (See Comments) 10/22/2014  . Nsaids Itching and Other (See Comments) 07/10/2015  . Penicillins Anaphylaxis and Other (See Comments) 05/29/2007  . Sulfa antibiotics Anaphylaxis, Shortness Of Breath, and Other (See Comments) 11/09/2014  . Tessalon [benzonatate] Anaphylaxis 05/12/2012  . Levaquin [levofloxacin in d5w] Swelling 12/11/2015  . Rocephin [ceftriaxone] Other (See Comments) 08/19/2016  . Shellfish allergy Itching and Other (See Comments) 05/12/2012  . Miripirium  Rash and Other (See Comments) 02/20/2015    Family History  Problem Relation Age of Onset  . Diabetes Father   . Hypertension Father   . Heart disease Mother     congenital - MVP  . Asthma      fhx  . Hypertension      fhx  . Stroke      fhx    Social History   Social History  . Marital status: Single    Spouse name: N/A  . Number of children: 0  . Years of education: 24   Occupational History  . Disability    Social History Main Topics  . Smoking status: Former Smoker    Packs/day: 1.00    Years: 12.00    Types: Cigars, Cigarettes    Quit date: 09/07/2014  . Smokeless tobacco: Never Used  . Alcohol use No  . Drug use: No  . Sexual activity: Not Currently   Other Topics Concern  . Not on file   Social History Narrative   Patient lives at home  with mother father.    Patient has 2 children.    Patient is single.    Patient was left hand but after stroke patient is now right handed.    Patient has  11th grade education.     REVIEW OF SYSTEMS: Constitutional:  Generally frail and stays in bed or wheelchair alot ENT:  No nose bleeds Pulm:  No dyspnea CV:  No palpitations, no LE edema.  GU:  No hematuria, no frequency GI:  Per HPI.  No n/v Heme:  Per HPI.  No unusual bleeding from skin   Transfusions:  Per HPI Neuro:  No headaches, no peripheral tingling or numbness Derm:  Chronic sacral decubs Endocrine:  No sweats or chills.  No polyuria or dysuria Immunization:  Reviewed.  Do not see flu shot for 2017/2018.  Travel:  None beyond local counties in last few months.    PHYSICAL EXAM: Vital signs in last 24 hours: Vitals:   08/27/16 0920 08/27/16 1030  BP: (!) 73/38 (!) 90/54  Pulse: 85 (!) 114  Resp: (!) 24 18  Temp: (!) 94.8 F (34.9 C) (!) 95.8 F (35.4 C)   Wt Readings from Last 3 Encounters:  08/17/2016 54.9 kg (121 lb)  08/16/16 51.2 kg (112 lb 14 oz)  07/26/16 61.2 kg (135 lb)    General: severely chronically ill looking AAM.  Resting  quietly, did not attempt to awaken pt.  And he did not awaken to exam Head:  Atraumatic, symmetric faces.   Eyes:  No icterus.  + conj pallor Ears:  Unable to assess hearing  Nose:  No discharge Mouth:  Lips dry.   Neck:  Trach in place, no discharge Lungs:  Breathing unlabored on trach collar.  Heart: regular, tachy.  Abdomen:  Soft, BS hypoactive. Not tender. G tube on left, surrounding skin denuded but not ulcerated, tubing not fractured, no discharge.  Ostomy bag in lower abdomen: scant amount of liquid, deep red blood.   Supra pubic catheter in pelvis: no blody urine.     Rectal: deferred   Musc/Skeltl: profound, global muscular wasting and atrophy Extremities:  No CCE  Neurologic:  Not tested as did not awaken pt. Skin:  Hypopigmented lesions on shins B.  Pressure dressings on both heals, not removed for exam.    Intake/Output from previous day: 11/19 0701 - 11/20 0700 In: 3672.9 [I.V.:1422.9; IV Piggyback:2250] Out: 200 [Urine:200] Intake/Output this shift: Total I/O In: 375 [I.V.:375] Out: -   LAB RESULTS:  Recent Labs  08/16/2016 1116 08/27/16 0950  WBC 30.8* 29.1*  HGB 7.9* 7.0*  HCT 24.6* 22.0*  PLT 503* 372   BMET Lab Results  Component Value Date   NA 127 (L) 08/27/2016   NA 123 (L) 09/05/2016   NA 133 (L) 08/18/2016   K 3.7 08/27/2016   K 4.2 08/17/2016   K 5.0 08/18/2016   CL 104 08/27/2016   CL 99 (L) 08/12/2016   CL 103 08/18/2016   CO2 20 (L) 08/27/2016   CO2 20 (L) 09/05/2016   CO2 23 08/18/2016   GLUCOSE 22 (LL) 08/27/2016   GLUCOSE 125 (H) 08/30/2016   GLUCOSE 182 (H) 08/18/2016   BUN 40 (H) 08/27/2016   BUN 40 (H) 08/23/2016   BUN 19 08/18/2016   CREATININE 0.70 08/27/2016   CREATININE 0.76 08/21/2016   CREATININE 0.56 (L) 08/18/2016   CALCIUM 8.4 (L) 08/27/2016   CALCIUM 8.7 (L) 09/04/2016   CALCIUM 8.7 (L) 08/18/2016   LFT  Recent Labs  08/19/2016 1116 08/27/16 0950  PROT 4.9* 4.6*  ALBUMIN 1.5* 1.4*  AST 14* 15  ALT 10*  11*  ALKPHOS 92 100  BILITOT 0.3 0.2*   PT/INR Lab Results  Component Value Date   INR 1.24 09/02/2016   INR 1.18 07/11/2015   INR 1.10 05/26/2015   Hepatitis Panel No results for input(s): HEPBSAG, HCVAB, HEPAIGM, HEPBIGM in the last 72 hours. C-Diff No components found for: CDIFF Lipase     Component Value Date/Time   LIPASE 13 09/05/2016 1116    Drugs of Abuse     Component Value Date/Time   LABOPIA POSITIVE (A) 07/24/2016 0135   COCAINSCRNUR NONE DETECTED 07/24/2016 0135   COCAINSCRNUR NEG 01/31/2015 1356   LABBENZ NONE DETECTED 07/24/2016 0135   AMPHETMU NONE DETECTED 07/24/2016 0135   THCU NONE DETECTED 07/24/2016 0135   LABBARB NONE DETECTED 07/24/2016 0135     RADIOLOGY STUDIES: Ct Abdomen Pelvis W Contrast  Result Date: 08/27/2016 CLINICAL DATA:  Shortness of Breath.  Blood in colostomy bag. EXAM: CT ABDOMEN AND PELVIS WITH CONTRAST TECHNIQUE: Multidetector CT imaging of the abdomen and pelvis was performed using the standard protocol following bolus administration of intravenous contrast. CONTRAST:  100 cc Isovue-300 IV COMPARISON:  12/10/2015 FINDINGS: Lower chest: The distal esophagus is filled with debris. Small bilateral pleural effusions. Dense consolidation in the lower lobes bilaterally, similar to prior study. Heart is normal size. Hepatobiliary: No focal hepatic abnormality. Gallbladder unremarkable. Pancreas: No focal abnormality or ductal dilatation. Spleen: No focal abnormality.  Normal size. Adrenals/Urinary Tract: No adrenal abnormality. No focal renal abnormality. No stones or hydronephrosis. Urinary bladder is decompressed with mild wall thickening. Suprapubic catheter in place. Stomach/Bowel: Left lower quadrant ostomy noted. No evidence of bowel obstruction. Gastrostomy tube is in place within the stomach. Moderate debris/material within the stomach. Vascular/Lymphatic: No evidence of aneurysm in the left periaortic region adjacent to the L2 level,  there is irregular mixed density necrotic adenopathy versus periaortic abscess measuring 2.7 x 2.0 cm. Areas of high density noted within this periaortic process is well as the right psoas muscle of unknown etiology, possible calcifications from chronic process. IVC filter in place within the infrarenal IVC. Reproductive: No visible focal abnormality. Other: Small amount of free fluid adjacent to the decompressed urinary bladder. No free air. Musculoskeletal: Probable large decubitus ulcer noted over the sacrum and right posterior iliac bone. Underlying sclerosis within the right posterior iliac bone is increased since prior study and may reflect chronic osteomyelitis. There is gas and fluid noted within the adjacent right SI joint. This is new since prior study. This is most compatible with septic arthritis. The adjacent iliac bone demonstrates irregular lucency and bony destruction compatible with adjacent osteomyelitis. Fluid and gas extend into the right iliacus muscle as well as the iliopsoas and in the right psoas muscle superiorly compatible with intramuscular abscesses. Numerous abnormal vertebral bodies. Near complete destruction of the L2 vertebral body. Areas of lucency within L4, L1, T8 most compatible with multilevel osteomyelitis changes. Partial collapse and sclerosis in the L5 vertebral body, likely related to chronic osteomyelitis. 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 Evidence of septic arthritis within the right SI joint and acute osteomyelitis in the adjacent right iliac bone. Changes of multi level osteomyelitis throughout the vertebral bodies T8, L1, L2, L4, and L5. 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  Mixed density complex process in the left periaortic space adjacent to the L2 vertebral body which could reflect necrotic and partially  calcified adenopathy or periaortic abscess. Gastrostomy tube in expected position. Small bilateral pleural effusions. Dense consolidation in the lower lobes is stable since prior study and most compatible with pneumonia. Debris/ fluid filled dilated distal esophagus and stomach. These results were called by telephone at the time of interpretation on 08/09/2016 at 2:25 pm to Dr. Nanda Quinton , who verbally acknowledged these results. Electronically Signed   By: Rolm Baptise M.D.   On: 08/10/2016 14:29   Dg Chest Portable 1 View  Result Date: 08/25/2016 CLINICAL DATA:  Altered mental status. EXAM: PORTABLE CHEST 1 VIEW COMPARISON:  08/13/2016 FINDINGS: Tracheostomy tube and right PICC line remain in place, unchanged. There is mild cardiomegaly. Bilateral lower lobe airspace opacities, similar on the right prior study, increased on the left. No visible effusions. No acute bony abnormality. IMPRESSION: Bibasilar airspace opacities concerning for pneumonia, increasing on the left since prior study. Electronically Signed   By: Rolm Baptise M.D.   On: 08/21/2016 12:35     IMPRESSION:   *  Chronic anemia.  Transfused x 2 08/09/16. 1 PRBC 01/17/16. 11/2015.  07/2015.  1st of 2 PRBCs now transfusing.    *  Bleeding per diverting colostomy originally place 10/2014.  ? Colitis (ischemic, infectious, less likely IBD), ? UGI source given PPI replaced by pepcid and on Solumedrol for last 4 weeks?   *  Leaking per G tube.  IR placed this 05/2015, cleared of debris 07/11/16. Leaking has responded to snugging of the external bumper in past and IR PA just now readjusted to external bumper.   *  Chronic PMC.  Albumin is 1.4 currently.    *  Dysphagia.  Last SLP swallow eval 08/18/16: cleared for regular diet and thins (same as in 12/2015).  Given appearance of debris in esophagus, wonder if he might need barium esophagram (not SLP study)  *  Debris and fluid in esophagus, stomach.  Established dx of gastroparesis.  EGD in  04/2013: retained food in stomach, mucosa normal but H Pylori + and treated with PPI/abx.    *  Recent admission for aspiration PNA.  Chronic resp failure, trach dependent.   Many admissions with recurrentl acute resp failure.    PLAN:     *  ? Colonoscopy vs flex sig.  If colonoscopy, g tube will facilitate prep.   Keep on clears.  Follow Hgb post transfusion.    Azucena Freed  08/27/2016, 11:11 AM Pager: [phone]

## 2016-08-27 NOTE — Consult Note (Addendum)
[place] Nurse wound consult: Pt is familiar to [facility] Main team from recent admission on 11/3. Wound type: Mother at bedside states Vac dressings were removed from sacrum and bilat ischium wounds prior to admission, and she will resume use after he returns home. She is very knowledgeable regarding wound appearance and topical treatment. 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  No obvious s/s of infection, wound appearance has actually improved since last admission. Refer to nursing notes on 11/19 for measurements of wounds.   Sacrum wound is 70% red, 10% exposed bone, 20% black, mod amt tan drainage, strong odor.  Undermining to wound edges. Stage 2 wounds are pink and dry to left heel, right elbow, and back and left thigh are full thickness healing wounds, pink and dry. Ostomy pouch intact with good seal, extra supplies at the bedside. Pt is on an air mattress at this time and heels are floated to reduce pressure; discussed plan of care with mother and patient. Foam dressings to back, elbow, left heel and leg to promote healing.  Moist gauze dressings to sacrum and bilat ischium. If sepsis is a concern, then consider MRI.  Pt has recently been treated for osteomyelitis.Discussed plan of care with mother and she denies further questions. Please re-consult if further assistance is needed.  Thank-you,  Julien Girt MSN, [place], [place], [place], [place]

## 2016-08-27 NOTE — Progress Notes (Signed)
Pharmacy Antibiotic Note  Jason [name] is a 30 y.o. male admitted on [id number] with complicated PMH now presenting with sepsis from chronic osteomyelitis vs UTI vs new abscess.  Patient has recent antibiotic exposure and multiple allergies.  Pharmacy has been consulted for Vancomycin and Aztreonam dosing.  PMH: quadriplegia with resulting sacral decub, chronic trach, ostomy, suprapubic catheter, recurrent UTI, hx seizures, DM, gastroparesis, hx PE/DVT, anxiety, anemaia  Plan: Vancomycin 500mg  IV q12h (pt has been therapeutic on this dose in the past). Aztreonam 1gm IV q8h Would recommend Vancomycin level early in therapy as SCr is not a reliable indicator for renal function in a patient with quadriplegia. Follow-up culture data and clinical progress.  Height: 5\' 8"  (172.7 cm) Weight: 121 lb (54.9 kg) IBW/kg (Calculated) : 68.4  Temp (24hrs), Avg:96.8 F (36 C), Min:94.8 F (34.9 C), Max:98.8 F (37.1 C)   Recent Labs Lab 08/28/2016 1116 08/16/2016 1127 08/27/2016 1757 09/01/2016 2018 08/27/16 0950  WBC 30.8*  --   --   --  29.1*  CREATININE 0.76  --   --   --  0.70  LATICACIDVEN  --  0.62 0.6 0.8  --     Estimated Creatinine Clearance: 102.9 mL/min (by C-G formula based on SCr of 0.7 mg/dL).    Allergies  Allergen Reactions  . Amikacin Sulfate Other (See Comments)    Seizure   . Cefuroxime Axetil Anaphylaxis    Tolerated cefepime 12/2015 - with Pepcid/Solu-Medrol  . Ertapenem Other (See Comments)    Rash and confusion-->tolerated Imipenem   . Morphine And Related Other (See Comments)    Changed mental status, confusion, headache, visual hallucination  . Nsaids Itching and Other (See Comments)    Risk of bleeding, itching  . Penicillins Anaphylaxis and Other (See Comments)    Tolerated Imipenem; no reaction to 7 day course of amoxicillin in 2015 Has patient had a PCN reaction causing immediate rash, facial/tongue/throat swelling, SOB or lightheadedness with hypotension:  Yes Has patient had a PCN reaction causing severe rash involving mucus membranes or skin necrosis: No Has patient had a PCN reaction that required hospitalization Yes Has patient had a PCN reaction occurring within the last 10 years: No If all of the above answers are "NO", then may proceed with Cephalo  . Sulfa Antibiotics Anaphylaxis, Shortness Of Breath and Other (See Comments)  . Tessalon [Benzonatate] Anaphylaxis  . Levaquin [Levofloxacin In D5w] Swelling    Hand and forearm swelling  . Rocephin [Ceftriaxone] Other (See Comments)    Confusion  . Shellfish Allergy Itching and Other (See Comments)    Took benadryl to alleviate reaction  . Miripirium Rash and Other (See Comments)    Change in mental status    Antimicrobials this admission: Linezolid PO 11/19 >> 11/20 Vancomycin 11/20 >> Aztreonam 11/20 >>  Dose adjustments this admission:   Microbiology results: 11/19 BCx x 3: pending 11/19 UCx (suprapub cath): pending  11/20 MRSA PCR: positive  Thank you for allowing pharmacy to be a part of this patient's care.  [employer], Pharm.D., BCPS Clinical Pharmacist Pager [phone] 08/27/2016 5:58 PM

## 2016-08-28 ENCOUNTER — Inpatient Hospital Stay (HOSPITAL_COMMUNITY): Payer: Medicaid Other

## 2016-08-28 ENCOUNTER — Encounter (HOSPITAL_COMMUNITY): Payer: Self-pay | Admitting: Certified Registered Nurse Anesthetist

## 2016-08-28 DIAGNOSIS — G9340 Encephalopathy, unspecified: Secondary | ICD-10-CM

## 2016-08-28 DIAGNOSIS — L89154 Pressure ulcer of sacral region, stage 4: Secondary | ICD-10-CM

## 2016-08-28 DIAGNOSIS — A401 Sepsis due to streptococcus, group B: Secondary | ICD-10-CM

## 2016-08-28 DIAGNOSIS — R400 Somnolence: Secondary | ICD-10-CM

## 2016-08-28 DIAGNOSIS — E43 Unspecified severe protein-calorie malnutrition: Secondary | ICD-10-CM

## 2016-08-28 DIAGNOSIS — M8619 Other acute osteomyelitis, multiple sites: Secondary | ICD-10-CM

## 2016-08-28 DIAGNOSIS — K921 Melena: Secondary | ICD-10-CM

## 2016-08-28 DIAGNOSIS — Z889 Allergy status to unspecified drugs, medicaments and biological substances status: Secondary | ICD-10-CM

## 2016-08-28 LAB — CBC WITH DIFFERENTIAL/PLATELET
BASOS PCT: 0 %
Basophils Absolute: 0 10*3/uL (ref 0.0–0.1)
EOS PCT: 0 %
Eosinophils Absolute: 0 10*3/uL (ref 0.0–0.7)
HCT: 32.1 % — ABNORMAL LOW (ref 39.0–52.0)
Hemoglobin: 10.7 g/dL — ABNORMAL LOW (ref 13.0–17.0)
LYMPHS ABS: 1.5 10*3/uL (ref 0.7–4.0)
Lymphocytes Relative: 4 %
MCH: 27.1 pg (ref 26.0–34.0)
MCHC: 33.3 g/dL (ref 30.0–36.0)
MCV: 81.3 fL (ref 78.0–100.0)
MONO ABS: 3.3 10*3/uL — AB (ref 0.1–1.0)
Monocytes Relative: 9 %
Neutro Abs: 31.7 10*3/uL — ABNORMAL HIGH (ref 1.7–7.7)
Neutrophils Relative %: 87 %
PLATELETS: 412 10*3/uL — AB (ref 150–400)
RBC: 3.95 MIL/uL — ABNORMAL LOW (ref 4.22–5.81)
RDW: 16.6 % — ABNORMAL HIGH (ref 11.5–15.5)
WBC MORPHOLOGY: INCREASED
WBC: 36.5 10*3/uL — ABNORMAL HIGH (ref 4.0–10.5)

## 2016-08-28 LAB — COMPREHENSIVE METABOLIC PANEL
ALT: 12 U/L — AB (ref 17–63)
AST: 14 U/L — ABNORMAL LOW (ref 15–41)
Albumin: 1.6 g/dL — ABNORMAL LOW (ref 3.5–5.0)
Alkaline Phosphatase: 117 U/L (ref 38–126)
Anion gap: 5 (ref 5–15)
BUN: 42 mg/dL — ABNORMAL HIGH (ref 6–20)
CALCIUM: 8.2 mg/dL — AB (ref 8.9–10.3)
CHLORIDE: 103 mmol/L (ref 101–111)
CO2: 18 mmol/L — ABNORMAL LOW (ref 22–32)
CREATININE: 1 mg/dL (ref 0.61–1.24)
Glucose, Bld: 263 mg/dL — ABNORMAL HIGH (ref 65–99)
Potassium: 4.6 mmol/L (ref 3.5–5.1)
Sodium: 126 mmol/L — ABNORMAL LOW (ref 135–145)
TOTAL PROTEIN: 5.4 g/dL — AB (ref 6.5–8.1)
Total Bilirubin: 0.4 mg/dL (ref 0.3–1.2)

## 2016-08-28 LAB — BLOOD CULTURE ID PANEL (REFLEXED)
Acinetobacter baumannii: NOT DETECTED
Candida albicans: NOT DETECTED
Candida glabrata: NOT DETECTED
Candida krusei: NOT DETECTED
Candida parapsilosis: NOT DETECTED
Candida tropicalis: NOT DETECTED
ENTEROBACTER CLOACAE COMPLEX: NOT DETECTED
Enterobacteriaceae species: NOT DETECTED
Enterococcus species: NOT DETECTED
Escherichia coli: NOT DETECTED
HAEMOPHILUS INFLUENZAE: NOT DETECTED
Klebsiella oxytoca: NOT DETECTED
Klebsiella pneumoniae: NOT DETECTED
Listeria monocytogenes: NOT DETECTED
NEISSERIA MENINGITIDIS: NOT DETECTED
PROTEUS SPECIES: NOT DETECTED
PSEUDOMONAS AERUGINOSA: NOT DETECTED
SERRATIA MARCESCENS: NOT DETECTED
STAPHYLOCOCCUS SPECIES: NOT DETECTED
STREPTOCOCCUS AGALACTIAE: NOT DETECTED
STREPTOCOCCUS PNEUMONIAE: NOT DETECTED
STREPTOCOCCUS SPECIES: DETECTED — AB
Staphylococcus aureus (BCID): NOT DETECTED
Streptococcus pyogenes: NOT DETECTED

## 2016-08-28 LAB — GLUCOSE, CAPILLARY
GLUCOSE-CAPILLARY: 236 mg/dL — AB (ref 65–99)
Glucose-Capillary: 26 mg/dL — CL (ref 65–99)
Glucose-Capillary: 263 mg/dL — ABNORMAL HIGH (ref 65–99)
Glucose-Capillary: 179 mg/dL — ABNORMAL HIGH (ref 65–99)
Glucose-Capillary: 168 mg/dL — ABNORMAL HIGH (ref 65–99)

## 2016-08-28 LAB — MAGNESIUM: MAGNESIUM: 1.9 mg/dL (ref 1.7–2.4)

## 2016-08-28 LAB — PROCALCITONIN: PROCALCITONIN: 1.25 ng/mL

## 2016-08-28 LAB — BLOOD GAS, ARTERIAL
Acid-base deficit: 10.3 mmol/L — ABNORMAL HIGH (ref 0.0–2.0)
Bicarbonate: 15.5 mmol/L — ABNORMAL LOW (ref 20.0–28.0)
DRAWN BY: 249101
FIO2: 0.28
O2 SAT: 97.7 %
PATIENT TEMPERATURE: 98.6
pCO2 arterial: 36.6 mmHg (ref 32.0–48.0)
pH, Arterial: 7.251 — ABNORMAL LOW (ref 7.350–7.450)
pO2, Arterial: 105 mmHg (ref 83.0–108.0)

## 2016-08-28 LAB — TYPE AND SCREEN
ABO/RH(D): O POS
ANTIBODY SCREEN: NEGATIVE
UNIT DIVISION: 0
Unit division: 0

## 2016-08-28 LAB — PHOSPHORUS: PHOSPHORUS: 3.3 mg/dL (ref 2.5–4.6)

## 2016-08-28 MED ORDER — TIGECYCLINE 50 MG IV SOLR
100.0000 mg | Freq: Once | INTRAVENOUS | Status: DC
  Filled 2016-08-28: qty 100

## 2016-08-28 MED ORDER — TIGECYCLINE 50 MG IV SOLR
50.0000 mg | Freq: Two times a day (BID) | INTRAVENOUS | Status: DC
  Administered 2016-08-29 – 2016-08-31 (×5): 50 mg via INTRAVENOUS
  Filled 2016-08-28 (×6): qty 100

## 2016-08-28 MED ORDER — METHYLPREDNISOLONE SODIUM SUCC 125 MG IJ SOLR
125.0000 mg | Freq: Once | INTRAMUSCULAR | Status: AC
  Administered 2016-08-28: 125 mg via INTRAVENOUS
  Filled 2016-08-28: qty 2

## 2016-08-28 MED ORDER — LINEZOLID 600 MG/300ML IV SOLN
600.0000 mg | Freq: Two times a day (BID) | INTRAVENOUS | Status: DC
  Administered 2016-08-28 – 2016-08-31 (×7): 600 mg via INTRAVENOUS
  Filled 2016-08-28 (×8): qty 300

## 2016-08-28 MED ORDER — PEG 3350-KCL-NA BICARB-NACL 420 G PO SOLR: 2000.0000 mL | Freq: Once | ORAL | Status: DC

## 2016-08-28 MED ORDER — ACETAMINOPHEN 650 MG RE SUPP
650.0000 mg | Freq: Three times a day (TID) | RECTAL | Status: DC | PRN
  Administered 2016-08-28 – 2016-08-29 (×2): 650 mg via RECTAL
  Filled 2016-08-28 (×2): qty 1

## 2016-08-28 MED ORDER — PEG 3350-KCL-NA BICARB-NACL 420 G PO SOLR
2000.0000 mL | Freq: Once | ORAL | Status: DC
  Filled 2016-08-28: qty 4000

## 2016-08-28 NOTE — Progress Notes (Signed)
Paged rapid response and MD. Patient seems very lethargic and doesn't always respond to pain. Neck is swollen. MD to bedside to assess patient.

## 2016-08-28 NOTE — Consult Note (Signed)
PULMONARY / CRITICAL CARE MEDICINE   Name: Jason [name] MRN: [id number] DOB: [birth year]    ADMISSION DATE:  08/25/2016 CONSULTATION DATE:  11/21  REFERRING MD:  [facility]  CHIEF COMPLAINT: lethargy and trach swelling  HISTORY OF PRESENT ILLNESS:   30 yo paraplegic , chronic trach, chronic infections in setting of open wounds sacrum and foot. Started on Azactam without premedication and he has become lethargic and has mild swelling around trach without compromise. Solumedrol has been started and he is HD stable. Mother reports this is his usual reaction to Azactam. Little for PCCM to offer.  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 ([place]) (06/18/2016); Colostomy in place [facility]); Complication of anesthesia; Depression; Diabetic neuropathy ([place]); Diabetic retinopathy ([place]); Dysrhythmia; Family history of anesthesia complication; Fibromyalgia; Gastroparesis; GERD (gastroesophageal reflux disease); H/O constipation; H/O respiratory failure; H/O thrombosis; MRSA infection; Multiple allergies (11/02/2015); Pneumonia; Recurrent UTI (11/02/2015); Seizures ([place]) (2017); Stroke [facility]) (01/29/2013); Syncope (02/16/2015); Tracheostomy present ([place]); and Type I diabetes mellitus ([place]).  PAST SURGICAL HISTORY: He  has a past surgical history that includes Tonsillectomy; Multiple extractions with alveoloplasty (N/A, 08/03/2014); TEE without cardioversion (N/A, 08/17/2014); Debridment of decubitus ulcer (N/A, 10/04/2014); Laparoscopic diverted colostomy (N/A, 10/12/2014); Insertion of suprapubic catheter (N/A, 10/12/2014); Incision and drainage of wound (N/A, 11/12/2014); Application of a-cell of back (N/A, 11/12/2014); Incision and drainage of wound (N/A, 11/18/2014); Incision and drainage of wound (N/A, 11/25/2014); Minor application of wound vac (N/A, 11/25/2014); Incision and drainage of wound (Right, 12/08/2014);  Application of a-cell of back (N/A, 12/08/2014); TEE without cardioversion (N/A, 05/05/2015); Incision and drainage of wound (Bilateral, 05/05/2015); Hematoma evacuation (N/A, 05/05/2015); Incision and drainage of wound (N/A, 08/04/2015); Application of a-cell of chest/abdomen (N/A, 08/04/2015); Colostomy; Gastrostomy tube placement; Radiology with anesthesia (N/A, 07/05/2016); and ir generic historical (07/11/2016).  Allergies  Allergen Reactions  . Amikacin Sulfate Other (See Comments)    Seizure   . Cefuroxime Axetil Anaphylaxis    Tolerated cefepime 12/2015 - with Pepcid/Solu-Medrol  . Ertapenem Other (See Comments)    Rash and confusion-->tolerated Imipenem   . Morphine And Related Other (See Comments)    Changed mental status, confusion, headache, visual hallucination  . Nsaids Itching and Other (See Comments)    Risk of bleeding, itching  . Penicillins Anaphylaxis and Other (See Comments)    Tolerated Imipenem; no reaction to 7 day course of amoxicillin in 2015 Has patient had a PCN reaction causing immediate rash, facial/tongue/throat swelling, SOB or lightheadedness with hypotension: Yes Has patient had a PCN reaction causing severe rash involving mucus membranes or skin necrosis: No Has patient had a PCN reaction that required hospitalization Yes Has patient had a PCN reaction occurring within the last 10 years: No If all of the above answers are "NO", then may proceed with Cephalo  . Sulfa Antibiotics Anaphylaxis, Shortness Of Breath and Other (See Comments)  . Tessalon [Benzonatate] Anaphylaxis  . Levaquin [Levofloxacin In D5w] Swelling    Hand and forearm swelling  . Rocephin [Ceftriaxone] Other (See Comments)    Confusion  . Shellfish Allergy Itching and Other (See Comments)    Took benadryl to alleviate reaction  . Aztreonam Swelling    He appears to tolerate if you premedicate with steroids. Had facial/neck swelling without premedication  . Miripirium Rash and Other (See  Comments)    Change in mental status    No current facility-administered medications on file prior  to encounter.    Current Outpatient Prescriptions on File Prior to Encounter  Medication Sig  . acetaminophen (TYLENOL) 325 MG tablet Take 2 tablets (650 mg total) by mouth every 6 (six) hours as needed for mild pain, moderate pain, fever or headache.  . baclofen (LIORESAL) 20 MG tablet Take 1 tablet (20 mg total) by mouth 4 (four) times daily.  Marland Kitchen BISACODYL 5 MG EC tablet Take ONE (1) tablet by mouth twice daily as needed FOR MODERATE CONSTIPATION (Patient taking differently: Take 5 MG by mouth twice daily as needed FOR MODERATE CONSTIPATION)  . buPROPion (WELLBUTRIN XL) 150 MG 24 hr tablet Take 1 tablet (150 mg total) by mouth daily.  . camphor-menthol (SARNA) lotion Apply topically as needed for itching.  . collagenase (SANTYL) ointment Apply 1 application topically 3 (three) times a week. (Patient taking differently: Apply 1 application topically as needed (dressing change). )  . dexlansoprazole (DEXILANT) 60 MG capsule Take 1 capsule (60 mg total) by mouth daily.  Marland Kitchen docusate (COLACE) 50 MG/5ML liquid Take 10 mLs (100 mg total) by mouth 2 (two) times daily. (Patient taking differently: Take 100 mg by mouth daily. )  . fentaNYL (DURAGESIC - DOSED MCG/HR) 75 MCG/HR place 1 patch (61mcg total) onto the skin every 3 (three) days  . ferrous sulfate 325 (65 FE) MG tablet Take 1 tablet (325 mg total) by mouth daily with breakfast.  . glucagon (GLUCAGON EMERGENCY) 1 MG injection INJECT INTO MUSCLE ONCE AS NEEDED (Patient taking differently: Inject 1 mg into the muscle once as needed (hypoglycemia). INJECT INTO MUSCLE ONCE AS NEEDED)  . GLUCERNA (GLUCERNA) LIQD 1,000 mLs by PEG Tube route continuous. 70 ml/hr  . hydrocortisone (CORTEF) 10 MG tablet Take 1 tablet (10 mg total) by mouth every evening. Hold for the next 5 days, while using solumedrol. Then resume as previously instructed  . HYDROmorphone  (DILAUDID) 8 MG tablet Take ONE (1) tablet (8mg  total) by mouth every 6 hours as needed for severe pain  . insulin aspart (NOVOLOG FLEXPEN) 100 UNIT/ML FlexPen Inject 0-10 Units into the skin 4 (four) times daily as needed for high blood sugar (CBG >200). Per sliding scale: CBG <200 = 0 units, 201-300 = 4 units, 301-400 = 6 units, >400 = 10 units  . insulin glargine (LANTUS) 100 UNIT/ML injection Inject 0.22 mLs (22 Units total) into the skin at bedtime. (Patient taking differently: Inject 22 Units into the skin every morning. )  . loratadine (CLARITIN) 10 MG tablet Take 1 tablet (10 mg total) by mouth daily.  Marland Kitchen LYRICA 100 MG capsule Take ONE (1)  capsule by mouth three times daily (morning,afternoon and evening) and 2 capsules at bedtime (Patient taking differently: Take 100 mg by mouth three times a day (morning/afternoon/evening) and 200 mg at bedtime)  . Magnesium Oxide 400 (240 Mg) MG TABS Take ONE (1) tablet by mouth twice daily (Patient taking differently: Take 400 MG by mouth twice daily)  . midodrine (PROAMATINE) 10 MG tablet Take 2 tablets (20 mg total) by mouth 3 (three) times daily with meals. (Patient taking differently: Take 40 mg by mouth daily as needed. )  . Multiple Vitamin (MULTIVITAMIN WITH MINERALS) TABS tablet Place 1 tablet into feeding tube daily.  . ondansetron (ZOFRAN ODT) 8 MG disintegrating tablet Place 1 tablet (8 mg total) into feeding tube every 8 (eight) hours as needed for nausea or vomiting.  Marland Kitchen oxybutynin (DITROPAN) 5 MG tablet Place 1 tablet (5 mg total) into feeding tube 2 (  two) times daily.  . Oxycodone HCl 20 MG TABS Take 1 tablet (20 mg total) by mouth every 6 (six) hours as needed (pain).  . VOLTAREN 1 % GEL apply 2 grams topically twice a day (Patient taking differently: Apply 2 grams topically twice a day as needed for shoulder pain)  . Water For Irrigation, Sterile (FREE WATER) SOLN Place 200 mLs into feeding tube every 8 (eight) hours. (Patient taking  differently: Place 300-400 mLs into feeding tube every 8 (eight) hours. )  . famotidine (PEPCID) 10 MG tablet Take 1 tablet (10 mg total) by mouth every 8 (eight) hours. (Patient not taking: Reported on 08/27/2016)  . famotidine (PEPCID) 20 MG tablet Take 1 tablet (20 mg total) by mouth 2 (two) times daily. Take around antibiotics infusion time (Patient not taking: Reported on 08/16/2016)  . hydrocortisone (CORTEF) 20 MG tablet Take 1 tablet (20 mg total) by mouth every morning. Hold for the next 5 days, while on solumedrol. Resume after this time as previously  . LORazepam (ATIVAN) 0.5 MG tablet Take 1 tablet (0.5 mg total) by mouth 3 (three) times daily as needed for anxiety. Reported on 11/02/2015 (Patient taking differently: Take 0.25-0.5 mg by mouth 3 (three) times daily as needed for anxiety. Reported on 11/02/2015)  . mupirocin ointment (BACTROBAN) 2 % Apply to affected area twice a day as needed    FAMILY HISTORY:  His @FAMSTP (<SUBSCRIPT> error)@  SOCIAL HISTORY: He  reports that he quit smoking about 1 years ago. His smoking use included Cigars and Cigarettes. He has a 12.00 pack-year smoking history. He has never used smokeless tobacco. He reports that he does not drink alcohol or use drugs.  REVIEW OF SYSTEMS:   NA  SUBJECTIVE:  NAD  VITAL SIGNS: BP 109/71   Pulse 74   Temp 97.3 F (36.3 C) (Axillary)   Resp 20   Ht 5\' 8"  (1.727 m)   Wt 121 lb (54.9 kg)   SpO2 100%   BMI 18.40 kg/m   HEMODYNAMICS:    VENTILATOR SETTINGS: FiO2 (%):  [28 %] 28 %  INTAKE / OUTPUT: I/O last 3 completed shifts: In: 3840.3 [I.V.:2611.3; Blood:670; IV Piggyback:559.1] Out: 350 [Urine:350]  PHYSICAL EXAMINATION: General: Lethargic Neuro:  Lethargic, para HEENT:  Trach with mild swelling Cardiovascular:  HSR RRR Lungs:  CTA Abdomen: +bs Musculoskeletal:  contacted lower ext Skin:  multiple sores noted  LABS:  BMET  Recent Labs Lab 09/06/2016 1116 08/27/16 0950 08/28/16 0415   NA 123* 127* 126*  K 4.2 3.7 4.6  CL 99* 104 103  CO2 20* 20* 18*  BUN 40* 40* 42*  CREATININE 0.76 0.70 1.00  GLUCOSE 125* 22* 263*    Electrolytes  Recent Labs Lab 08/09/2016 1116 08/27/16 0950 08/28/16 0415  CALCIUM 8.7* 8.4* 8.2*  MG  --  1.9 1.9  PHOS  --  <1.0* 3.3    CBC  Recent Labs Lab 08/19/2016 1116 08/27/16 0950 08/27/16 2030 08/28/16 0415  WBC 30.8* 29.1*  --  36.5*  HGB 7.9* 7.0* 10.9* 10.7*  HCT 24.6* 22.0* 32.4* 32.1*  PLT 503* 372  --  412*    Coag's  Recent Labs Lab 08/13/2016 1756  APTT 58*  INR 1.24    Sepsis Markers  Recent Labs Lab 08/13/2016 1127 08/23/2016 1756 08/25/2016 1757 08/23/2016 2018 08/28/16 0415  LATICACIDVEN 0.62  --  0.6 0.8  --   PROCALCITON  --  1.95  --   --  1.25  ABG  Recent Labs Lab 08/28/16 1030  PHART 7.251*  PCO2ART 36.6  PO2ART 105    Liver Enzymes  Recent Labs Lab 08/24/2016 1116 08/27/16 0950 08/28/16 0415  AST 14* 15 14*  ALT 10* 11* 12*  ALKPHOS 92 100 117  BILITOT 0.3 0.2* 0.4  ALBUMIN 1.5* 1.4* 1.6*    Cardiac Enzymes No results for input(s): TROPONINI, PROBNP in the last 168 hours.  Glucose  Recent Labs Lab 08/27/16 1126 08/27/16 1450 08/27/16 1552 08/27/16 2107 08/28/16 0822 08/28/16 1213  GLUCAP 94 115* 126* 205* 263* 236*    Imaging Ct Head Wo Contrast  Result Date: 08/28/2016 CLINICAL DATA:  Decreased in responsiveness EXAM: CT HEAD WITHOUT CONTRAST TECHNIQUE: Contiguous axial images were obtained from the base of the skull through the vertex without intravenous contrast. COMPARISON:  CT brain scan of 08/13/2016 FINDINGS: Brain: The ventricular system is unchanged and there are prominent cortical sulci consistent with atrophy. The septum remains midline in position. The fourth ventricle and basilar cisterns are unremarkable. No hemorrhage, mass lesion, or acute infarction is seen. Vascular: No vascular abnormality is seen on this unenhanced study. Skull: No calvarial  abnormality is noted. Sinuses/Orbits: The paranasal sinuses are pneumatized with only mild mucosal thickening within the sphenoid sinus. Other: None IMPRESSION: 1. Mild atrophy.  No acute intracranial abnormality. 2. Mucosal thickening within the sphenoid sinus. No air-fluid level is seen. Electronically Signed   By: [name]   On: 08/28/2016 12:10   Dg Chest Port 1 View  Result Date: 08/28/2016 CLINICAL DATA:  Tracheostomy patient, followup EXAM: PORTABLE CHEST 1 VIEW COMPARISON:  Portable chest x-ray of 08/08/2016 FINDINGS: The tracheostomy remains in good position well above the carina. Bibasilar volume loss is unchanged. Heart size is stable. Right PICC line tip overlies the lower SVC. IMPRESSION: 1. No significant change in bibasilar atelectasis. 2. Tracheostomy and right PICC line unchanged in position. Electronically Signed   By: [name]   On: 08/28/2016 08:02     STUDIES:  11/21 ct head >>neg  CULTURES: Per ID  ANTIBIOTICS: Per ID  SIGNIFICANT EVENTS:   LINES/TUBES: Trach>>   DISCUSSION: 30 yo paraplegic , chronic trach, chronic infections in setting of open wounds sacrum and foot. Started on Azactam without premedication and he has become lethargic and has mild swelling around trach without compromise. Solumedrol has been started and he is HD stable. Mother reports this is his usual reaction to Azactam. Little for PCCM to offer.  ASSESSMENT / PLAN:  PULMONARY A: Chronic trach with mild swelling around trach P:   Most likely drug reaction, solumedrol started.  CARDIOVASCULAR A:  No acute issue P:    RENAL Lab Results  Component Value Date   CREATININE 1.00 08/28/2016   CREATININE 0.70 08/27/2016   CREATININE 0.76 08/13/2016   CREATININE 0.63 06/18/2016   CREATININE 0.78 11/02/2015    Recent Labs Lab 08/11/2016 1116 08/27/16 0950 08/28/16 0415  K 4.2 3.7 4.6     A:   No acute issue P:     GASTROINTESTINAL A:   Bleeding around  colostomy P:   Per GI  HEMATOLOGIC A:   Anemia P:  Transfuse per protocol  INFECTIOUS A:   Chronic infections Acral and heel ulcers with osteomyelitis P:   Per id  ENDOCRINE A:   DM P:   Per IM  NEUROLOGIC A:   Decreased LOC ? Reaction to ABX CT head neg 11/21 P:   RASS goal: 0 Consider  changing abx. Mother says this is his usual reaction to Azactam   FAMILY  - Updates: Mother updated at bedside  - Inter-disciplinary family meet or Palliative Care meeting due by:  *11/28   [name] Minor ACNP [name] PCCM Pager [phone] till 3 pm If no answer page [phone] 08/28/2016, 12:43 PM   Attending note: I have seen and examined the patient with nurse practitioner/resident and agree with the note. History, labs and imaging reviewed.  30 year old with paraplegia, chronic trach, vent dependence, chronic infections. Patient well-known to the service. He developed swelling in his neck and lethargy after he received a dose of aztreonam today. PCCM consulted for evaluation of trach.  Trach examined. It seems well positioned with no issues He has stable resp status.  ABG shows metabolic acidosis but normal Pco2 and sats. CT head with no acute process.  Mom states that mental status is improving.  Recc Continue steroids Abx changed to linezolid  PCCM will be available as needed.   The patient is critically ill with multiple organ systems failure and requires high complexity decision making for assessment and support, frequent evaluation and titration of therapies, application of advanced monitoring technologies and extensive interpretation of multiple databases.  Critical care time - 35 mins. This represents my time independent of the NPs time taking care of the pt.  Marshell Garfinkel MD [facility] Pager [phone] If no answer or after 3pm call: [phone] 08/28/2016, 5:44 PM

## 2016-08-28 NOTE — Progress Notes (Signed)
Inpatient Diabetes Program Recommendations  AACE/ADA: New Consensus Statement on Inpatient Glycemic Control (2015)  Target Ranges:  Prepandial:   less than 140 mg/dL      Peak postprandial:   less than 180 mg/dL (1-2 hours)      Critically ill patients:  140 - 180 mg/dL   Lab Results  Component Value Date   GLUCAP 236 (H) 08/28/2016   HGBA1C 8.0 (H) 07/04/2016   Review of Glycemic Control  Diabetes history:DM1 Outpatient Diabetes medications: Lantus 22 units QHS, Novolog 0-10 units QID Current orders for Inpatient glycemic control: Novolog Sensitive TID  Inpatient Diabetes Program Recommendations:   Noted hypoglycemia this am of 26 mg/dl. Patient has DM type 1 and will need basal insulin to prevent from going into DKA when glucose trends increase. Please consider part of patient's home dose of basal insulin to start tonight or in the am, Lantus 8 units.  Thanks,  Tama Headings RN, MSN, [facility] Inpatient Diabetes Coordinator Team Pager [phone] (8a-5p)

## 2016-08-28 NOTE — Progress Notes (Signed)
  Patient reported to have some neck swelling per RN and mother report. He had received aztreonam without premedication since we were unsure if he had issues with tolerance. We will give him steroids and stop aztreonam. Will list as allergy. He has tolerated vancomycin in the past. We will stop the vancomycin and switch back to linezolid in the time being. Await to reintroduce vancomycin, though I don't think this is the cause.   He has strep bacteremia thus will need gram positive coverage

## 2016-08-28 NOTE — Care Management Note (Signed)
Case Management Note  Patient Details  Name: Jason [name] MRN: [id number] Date of Birth: 11-03-1985  Subjective/Objective:  Patient has a suprpubic catheter, colostomy, picc, peg tube, trach, has wet to dry dressing bid, has a wound vac at home. Very lethargic,he is active with   [facility] for home meds and [facility] for nursing services. Per Jason [name] pt receives 18 hr per day of nursing .  NCM will cont to follow for dc needs.                Action/Plan:   Expected Discharge Date:                  Expected Discharge Plan:  [place]  In-House Referral:     Discharge planning Services  CM Consult  Post Acute Care Choice:  Home Health, Resumption of Svcs/PTA Provider Choice offered to:     DME Arranged:  IV pump/equipment DME Agency:  [place]:  RN, Nurse's Aide [place] Agency:  [place]  Status of Service:  In process, will continue to follow  If discussed at Long Length of Stay Meetings, dates discussed:    Additional Comments:  Zenon Mayo, RN 08/28/2016, 4:58 PM

## 2016-08-28 NOTE — Progress Notes (Signed)
PHARMACY - PHYSICIAN COMMUNICATION CRITICAL VALUE ALERT - BLOOD CULTURE IDENTIFICATION (BCID)  Results for orders placed or performed during the hospital encounter of 08/24/2016  Blood Culture ID Panel (Reflexed) (Collected: 08/10/2016 12:07 PM)  Result Value Ref Range   Enterococcus species NOT DETECTED NOT DETECTED   Listeria monocytogenes NOT DETECTED NOT DETECTED   Staphylococcus species NOT DETECTED NOT DETECTED   Staphylococcus aureus NOT DETECTED NOT DETECTED   Streptococcus species DETECTED (A) NOT DETECTED   Streptococcus agalactiae NOT DETECTED NOT DETECTED   Streptococcus pneumoniae NOT DETECTED NOT DETECTED   Streptococcus pyogenes NOT DETECTED NOT DETECTED   Acinetobacter baumannii NOT DETECTED NOT DETECTED   Enterobacteriaceae species NOT DETECTED NOT DETECTED   Enterobacter cloacae complex NOT DETECTED NOT DETECTED   Escherichia coli NOT DETECTED NOT DETECTED   Klebsiella oxytoca NOT DETECTED NOT DETECTED   Klebsiella pneumoniae NOT DETECTED NOT DETECTED   Proteus species NOT DETECTED NOT DETECTED   Serratia marcescens NOT DETECTED NOT DETECTED   Haemophilus influenzae NOT DETECTED NOT DETECTED   Neisseria meningitidis NOT DETECTED NOT DETECTED   Pseudomonas aeruginosa NOT DETECTED NOT DETECTED   Candida albicans NOT DETECTED NOT DETECTED   Candida glabrata NOT DETECTED NOT DETECTED   Candida krusei NOT DETECTED NOT DETECTED   Candida parapsilosis NOT DETECTED NOT DETECTED   Candida tropicalis NOT DETECTED NOT DETECTED   55 yoM with PMHx of C6 paragplegia complicated by neurogenic bladder and chronic pressure ulcer stage IV, pelvic osteo under the care of plastic surgery at [facility] (Dr. Luetta Nutting) who was admitted for UTI associated bacteremia (proteus,ecoli, group b strep), and pneumonia within the past month that was admitted on 11/19 with concern for  HCAP and vertebral osteomyelitis that is now growing streptococcus per BCID in BCx's obtain on 11/19.   Name of physician  (or Provider) Contacted: [place]   Changes to prescribed antibiotics required: none, Currently covered with vancomycin due to PCN allergy; however, pt has previously tolerated Cefepime with premedication with famotidine and solumedrol.  Will defer to ID on direction of antimicrobial therapy in setting of GI bleed and need for solumedrol for pretreatment if cephalosporin used.   Vincenza Hews, PharmD, BCPS 08/28/2016, 12:29 AM Pager: [phone]

## 2016-08-28 NOTE — Progress Notes (Signed)
[place] for Infectious Disease    Date of Admission:  08/19/2016   Total days of antibiotics 3        Day 2 linezolid           ID: Jason [name] is a 30 y.o. male with  Active Problems:   Gastroparesis due to DM (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 ([place])   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([place])   History of pulmonary embolism   DVT (deep venous thrombosis) ([name])   Severe protein-calorie malnutrition ([place])   Dysphagia, pharyngoesophageal phase   Decubitus ulcer of right perineal ischial region, stage 4 ([name])   Decubitus ulcer of left perineal ischial region, stage 4 ([place])   Sacral decubitus ulcer, stage IV ([name])   Multiple allergies   Recurrent UTI   Respiratory failure, chronic ([name])   Aspiration pneumonia of right lower lobe ([name])   Seizures ([name])   Anxiety state   Anemia of chronic disease   Hyponatremia    Subjective: He was increasing solomnent today in addition to having some neck swelling thought to be related to drug allergy from aztreonam. He received dose of steroid for which swelling improved. This early evening having some moments of being alert but in pain, crying  Medications:  . albuterol  2.5 mg Nebulization QID  . baclofen  20 mg Oral QID  . buPROPion  150 mg Oral Daily  . Chlorhexidine Gluconate Cloth  6 each Topical Q0600  . fentaNYL  75 mcg Transdermal Q72H  . hydrocortisone  10 mg Oral QPM  . hydrocortisone  20 mg Oral q morning - 10a  . insulin aspart  0-9 Units Subcutaneous TID WC  . linezolid (ZYVOX) IV  600 mg Intravenous Q12H  . midodrine  20 mg Oral TID WC  . mupirocin ointment  1 application Nasal BID  . oxybutynin  5 mg Per Tube BID  . pantoprazole  40 mg Oral Daily  . pregabalin  100 mg Oral BID    Objective: Vital signs in last 24 hours: Temp:  [97.3 F (36.3 C)-97.5 F (36.4 C)] 97.5 F (36.4 C) (11/21 1200) Pulse Rate:   [66-94] 76 (11/21 1529) Resp:  [9-20] 20 (11/21 1529) BP: (109-135)/(71-84) 135/82 (11/21 1529) SpO2:  [98 %-100 %] 100 % (11/21 1529) FiO2 (%):  [28 %] 28 % (11/21 1700) Physical Exam  Constitutional: disoriented, encephalopathic. Chronically ill. No distress.  HENT:  Mouth/Throat: dry Neck: ? Swelling above trach ribbon Cardiovascular: Normal rate, regular rhythm and normal heart sounds. Exam reveals no gallop and no friction rub.  No murmur heard.  Pulmonary/Chest: course rhonchi.  Abdominal: Soft. Bowel sounds are normal. He exhibits no distension.  peg in place Skin: Skin is warm and dry. No rash noted. No erythema.    Lab Results  Recent Labs  08/27/16 0950 08/27/16 2030 08/28/16 0415  WBC 29.1*  --  36.5*  HGB 7.0* 10.9* 10.7*  HCT 22.0* 32.4* 32.1*  NA 127*  --  126*  K 3.7  --  4.6  CL 104  --  103  CO2 20*  --  18*  BUN 40*  --  42*  CREATININE 0.70  --  1.00   Liver Panel  Recent Labs  08/27/16 0950 08/28/16 0415  PROT 4.6* 5.4*  ALBUMIN 1.4* 1.6*  AST 15 14*  ALT 11* 12*  ALKPHOS 100 117  BILITOT 0.2* 0.4   Sedimentation Rate No results for input(s): ESRSEDRATE in the last 72 hours. C-Reactive Protein No results for input(s): CRP in the last 72 hours.  Microbiology: 11/19 blood cx strep species in 1 of 3 sets Studies/Results: Ct Head Wo Contrast  Result Date: 08/28/2016 CLINICAL DATA:  Decreased in responsiveness EXAM: CT HEAD WITHOUT CONTRAST TECHNIQUE: Contiguous axial images were obtained from the base of the skull through the vertex without intravenous contrast. COMPARISON:  CT brain scan of 08/13/2016 FINDINGS: Brain: The ventricular system is unchanged and there are prominent cortical sulci consistent with atrophy. The septum remains midline in position. The fourth ventricle and basilar cisterns are unremarkable. No hemorrhage, mass lesion, or acute infarction is seen. Vascular: No vascular abnormality is seen on this unenhanced study.  Skull: No calvarial abnormality is noted. Sinuses/Orbits: The paranasal sinuses are pneumatized with only mild mucosal thickening within the sphenoid sinus. Other: None IMPRESSION: 1. Mild atrophy.  No acute intracranial abnormality. 2. Mucosal thickening within the sphenoid sinus. No air-fluid level is seen. Electronically Signed   By: [name]   On: 08/28/2016 12:10   Dg Chest Port 1 View  Result Date: 08/28/2016 CLINICAL DATA:  Tracheostomy patient, followup EXAM: PORTABLE CHEST 1 VIEW COMPARISON:  Portable chest x-ray of 08/20/2016 FINDINGS: The tracheostomy remains in good position well above the carina. Bibasilar volume loss is unchanged. Heart size is stable. Right PICC line tip overlies the lower SVC. IMPRESSION: 1. No significant change in bibasilar atelectasis. 2. Tracheostomy and right PICC line unchanged in position. Electronically Signed   By: [name]   On: 08/28/2016 08:02     Assessment/Plan: Neck swelling = likely due to aztreonam. It was uncertain if he had drug allergy to it and previous received premedication including steroids though now comes in with gi bleed. We have discontinued aztreonam and listed in his allergies. He also was receiving vanco which he has received in the past. Will switch to linezolid since he tolerated it on admit.   Sacral osteo with associated septic SI joint and small abscess = presume that this is polymicrobial. Will need debridement. Appreciate dr. Eusebio Friendly efforts to take patient to or tomorrow for biopsy and debridement. Will start tigecycline since he has tolerated it in the past  Strep bacteremia = covered by linezolid for now  Leukocytosis = likely due to underlying infection, will continue to monitor  Gi bleed = endoscopy deferred until more stable  Encephalopathy = likely from infection vs. Reaction to abtx . Will continue to monitor  Pain mgnt = will treat with one dose of rectal tylenol to avoid sedation  Salena Ortlieb,  [facility] for Infectious Diseases Cell: [phone] Pager: [phone]  08/28/2016, 5:49 PM

## 2016-08-28 NOTE — Progress Notes (Signed)
Daily Rounding Note  08/28/2016, 10:04 AM  LOS: 2 days   SUBJECTIVE:   Chief complaint: bleeding per ostomy.   Leaking via g tube.  RN says just small amount of bleeding.  Leaking resolved.   Mom says he c/o burning but unable to say where, or if just felt hot.  He remains encephalopathic and quite sleepy.   Sometimes he cries when being moved.   Note radiology plans to exchange/replace G tube today.    OBJECTIVE:         Vital signs in last 24 hours:    Temp:  [95.8 F (35.4 C)-98.8 F (37.1 C)] 97.3 F (36.3 C) (11/21 0800) Pulse Rate:  [66-114] 66 (11/21 0821) Resp:  [10-20] 20 (11/21 0821) BP: (80-132)/(50-83) 132/83 (11/21 0821) SpO2:  [97 %-100 %] 100 % (11/21 0821) FiO2 (%):  [28 %] 28 % (11/21 0821) Last BM Date: 08/15/2016 Filed Weights   08/16/2016 1025  Weight: 54.9 kg (121 lb)   General: unresponsive to exam.  Cushingoid faces. Neck: swelling around trach but no erythema or discharge  Heart: RRR Chest: clear bil.  No labored resps Abdomen: moderately firm, NT, ND.  Hypoactive BS  Extremities: edema in UE and face, no it lower legs/feet.    Neuro/Psych:  Unresponsive to myself and to nurses.    Intake/Output from previous day: 11/20 0701 - 11/21 0700 In: 2417.4 [I.V.:1188.3; Blood:670; IV Piggyback:559.1] Out: 150 [Urine:150]  Intake/Output this shift: Total I/O In: -  Out: 125 [Urine:125]  Lab Results:  Recent Labs  08/25/2016 1116 08/27/16 0950 08/27/16 2030 08/28/16 0415  WBC 30.8* 29.1*  --  36.5*  HGB 7.9* 7.0* 10.9* 10.7*  HCT 24.6* 22.0* 32.4* 32.1*  PLT 503* 372  --  412*   BMET  Recent Labs  08/24/2016 1116 08/27/16 0950 08/28/16 0415  NA 123* 127* 126*  K 4.2 3.7 4.6  CL 99* 104 103  CO2 20* 20* 18*  GLUCOSE 125* 22* 263*  BUN 40* 40* 42*  CREATININE 0.76 0.70 1.00  CALCIUM 8.7* 8.4* 8.2*   LFT  Recent Labs  08/19/2016 1116 08/27/16 0950 08/28/16 0415  PROT 4.9*  4.6* 5.4*  ALBUMIN 1.5* 1.4* 1.6*  AST 14* 15 14*  ALT 10* 11* 12*  ALKPHOS 92 100 117  BILITOT 0.3 0.2* 0.4   PT/INR  Recent Labs  08/17/2016 1756  LABPROT 15.6*  INR 1.24   Hepatitis Panel No results for input(s): HEPBSAG, HCVAB, HEPAIGM, HEPBIGM in the last 72 hours.  Studies/Results: Ct Abdomen Pelvis W Contrast  Result Date: 08/28/2016 CLINICAL DATA:  Shortness of Breath.  Blood in colostomy bag. EXAM: CT ABDOMEN AND PELVIS WITH CONTRAST TECHNIQUE: Multidetector CT imaging of the abdomen and pelvis was performed using the standard protocol following bolus administration of intravenous contrast. CONTRAST:  100 cc Isovue-300 IV COMPARISON:  12/10/2015 FINDINGS: Lower chest: The distal esophagus is filled with debris. Small bilateral pleural effusions. Dense consolidation in the lower lobes bilaterally, similar to prior study. Heart is normal size. Hepatobiliary: No focal hepatic abnormality. Gallbladder unremarkable. Pancreas: No focal abnormality or ductal dilatation. Spleen: No focal abnormality.  Normal size. Adrenals/Urinary Tract: No adrenal abnormality. No focal renal abnormality. No stones or hydronephrosis. Urinary bladder is decompressed with mild wall thickening. Suprapubic catheter in place. Stomach/Bowel: Left lower quadrant ostomy noted. No evidence of bowel obstruction. Gastrostomy tube is in place within the stomach. Moderate debris/material within the stomach. Vascular/Lymphatic: No evidence  of aneurysm in the left periaortic region adjacent to the L2 level, there is irregular mixed density necrotic adenopathy versus periaortic abscess measuring 2.7 x 2.0 cm. Areas of high density noted within this periaortic process is well as the right psoas muscle of unknown etiology, possible calcifications from chronic process. IVC filter in place within the infrarenal IVC. Reproductive: No visible focal abnormality. Other: Small amount of free fluid adjacent to the decompressed urinary  bladder. No free air. Musculoskeletal: Probable large decubitus ulcer noted over the sacrum and right posterior iliac bone. Underlying sclerosis within the right posterior iliac bone is increased since prior study and may reflect chronic osteomyelitis. There is gas and fluid noted within the adjacent right SI joint. This is new since prior study. This is most compatible with septic arthritis. The adjacent iliac bone demonstrates irregular lucency and bony destruction compatible with adjacent osteomyelitis. Fluid and gas extend into the right iliacus muscle as well as the iliopsoas and in the right psoas muscle superiorly compatible with intramuscular abscesses. Numerous abnormal vertebral bodies. Near complete destruction of the L2 vertebral body. Areas of lucency within L4, L1, T8 most compatible with multilevel osteomyelitis changes. Partial collapse and sclerosis in the L5 vertebral body, likely related to chronic osteomyelitis. 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 Evidence of septic arthritis within the right SI joint and acute osteomyelitis in the adjacent right iliac bone. Changes of multi level osteomyelitis throughout the vertebral bodies T8, L1, L2, L4, and L5. 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 Mixed density complex process in the left periaortic space adjacent to the L2 vertebral body which could reflect necrotic and partially calcified adenopathy or periaortic abscess. Gastrostomy tube in expected position. Small bilateral pleural effusions. Dense consolidation in the lower lobes is stable since prior study and most compatible with pneumonia. Debris/ fluid filled dilated distal esophagus and stomach. These results were called by telephone at the time of interpretation on 09/06/2016 at 2:25 pm to Dr. Nanda Quinton , who verbally acknowledged these  results. Electronically Signed   By: Rolm Baptise M.D.   On: 09/02/2016 14:29   Dg Chest Port 1 View  Result Date: 08/28/2016 CLINICAL DATA:  Tracheostomy patient, followup EXAM: PORTABLE CHEST 1 VIEW COMPARISON:  Portable chest x-ray of 09/06/2016 FINDINGS: The tracheostomy remains in good position well above the carina. Bibasilar volume loss is unchanged. Heart size is stable. Right PICC line tip overlies the lower SVC. IMPRESSION: 1. No significant change in bibasilar atelectasis. 2. Tracheostomy and right PICC line unchanged in position. Electronically Signed   By: [name]   On: 08/28/2016 08:02   Dg Chest Portable 1 View  Result Date: 09/02/2016 CLINICAL DATA:  Altered mental status. EXAM: PORTABLE CHEST 1 VIEW COMPARISON:  08/13/2016 FINDINGS: Tracheostomy tube and right PICC line remain in place, unchanged. There is mild cardiomegaly. Bilateral lower lobe airspace opacities, similar on the right prior study, increased on the left. No visible effusions. No acute bony abnormality. IMPRESSION: Bibasilar airspace opacities concerning for pneumonia, increasing on the left since prior study. Electronically Signed   By: Rolm Baptise M.D.   On: 09/05/2016 12:35   Scheduled Meds: . albuterol  2.5 mg Nebulization QID  . aztreonam  1 g Intravenous Q8H  . baclofen  20 mg Oral QID  . buPROPion  150 mg Oral Daily  . Chlorhexidine Gluconate Cloth  6 each Topical Q0600  . fentaNYL  75 mcg Transdermal Q72H  . ferrous sulfate  325 mg Oral Q breakfast  . hydrocortisone  10 mg Oral QPM  . hydrocortisone  20 mg Oral q morning - 10a  . insulin aspart  0-9 Units Subcutaneous TID WC  . midodrine  20 mg Oral TID WC  . mupirocin ointment  1 application Nasal BID  . oxybutynin  5 mg Per Tube BID  . pantoprazole  40 mg Oral Daily  . pregabalin  100 mg Oral BID  . vancomycin  500 mg Intravenous Q12H   Continuous Infusions: . dextrose 5 % and 0.9% NaCl 100 mL/hr at 08/27/16 1820   PRN  Meds:.bisacodyl, LORazepam, traZODone   ASSESMENT:   *  Chronic anemia.  Transfused x 2 08/09/16. 1 PRBC 01/17/16. 11/2015.  07/2015.  2 PRBCs 11/20 with excellent response.   On po Iron.   *  Bleeding per diverting colostomy originally place 10/2014.   Low volume of blood persists. ? Colitis (ischemic, infectious, less likely IBD), ? UGI source given PPI replaced by pepcid and on Solumedrol for last 4 weeks?  Now back on po Protonix.   *  Leaking per G tube.  IR placed this 05/2015, cleared of debris 07/11/16. Leaking has responded to snugging of the external bumper in past and IR PA just now readjusted to external bumper.   *  Elevated BUN.  ? Due to UGI source of bleeding?   *  Strep bacteremia.  UTI associated bacteremia (proteus,ecoli, group b strep), and pneumonia within the past month.  Now with concern for HCAP and vertebral osteomyelitis.   On Vanc and Aztreonam.  ID following.  Hypothermia of 11/20 resolved, Bear Hugger blanket remains in place.    *  Hyponatremia.    *  AMS.  [place] from 13 to 9.  Despite correction of hypoglycemia.    *  IDDM.  Sugars today in 260s.   *  Chronic PMC.  Albumin is 1.4 currently.  Likely contributing to anasarca.   *  Dysphagia.  Last SLP swallow eval 08/18/16: cleared for regular diet and thins (same as in 12/2015). Given appearance of debris in esophagus, wonder if he might need barium esophagram (not SLP study) if future issues with swallowing.   *  Debris and fluid in esophagus, stomach.  Established dx of gastroparesis.  EGD in 04/2013: retained food in stomach, mucosa normal but H Pylori + and treated with PPI/abx.    *  Recent admission for aspiration PNA.  Chronic resp failure, trach dependent.   Many admissions with recurrent acute resp failure.    PLAN   *  Colonoscopy set for tomorrow.  Prep via g tube with golytely.  Stop the enteral iron to allow for complete bowel prep. Jason [name]  08/28/2016, 10:04 AM Pager:  [phone]

## 2016-08-28 NOTE — Progress Notes (Signed)
PROGRESS NOTE    Jason [name]  [id number] DOB: July 11, 1986 DOA: 08/27/2016 PCP: [name], MD   Brief Narrative:  Jason [name] is a 30 y.o. 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 No apparent trauma to the abdomen recently. He did receive transfusions in the past, last one week ago during last admission. In addition, the patient has been increasingly lethargic, with decreased urine output over the last 3 days. Mother reports that the patient had large volume of watery diarrhea over the last  last 36 hours. No apparent fever or chills. There is no apparent bleeding from the tract site, or worsening breathing or increased respiratory secretions. He was admitted to SDU and Infectious Disease, Gastroenterology, and IR was consulted for further evaluation and management. Patient remained Encephalopathic but was arousable later in the Day. A head CT was obtained which was unremarkable. Plastic Surgery was consulted for Wound Debridement, and PCCM was consulted to evaluate Swelling in Neck around Tracheostomy.   Assessment & Plan:   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 ([place])   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([place])   History of pulmonary embolism   DVT (deep venous thrombosis) ([name])   Severe protein-calorie malnutrition ([name])   Dysphagia, pharyngoesophageal phase   Decubitus ulcer of right perineal ischial region, stage 4 ([name])   Decubitus ulcer of left perineal ischial region, stage 4 ([name])   Sacral decubitus ulcer, stage IV ([name])   Multiple allergies   Recurrent  UTI   Respiratory failure, chronic ([name])   Aspiration pneumonia of right lower lobe ([name])   Seizures ([name])   Anxiety state   Anemia of chronic disease   Hyponatremia  Sepsis likely Multifactorial Etiologies with Evidence of Septic Arthritis within the Right SI Joint and Acute Osteomyelitis in the Adjacent Right illiac Bone with Changes of Multi Level Osteomyelitis throughout Vertebral Bodies of T8, L1, L2, L4, and L5 with Intramuscular Abscesses involving the Psoas, iliopsoas and iliacus muscles on the Right with Concomitant Dense Consolidation in the Lower lobes B/L compatible with PNA and Streptococcus Bacteremia, poA -Patient with Temp of 94.8, WBC of 30.8, Tachycardia of 114, and Tachypenia of 24 with Hypotension. Improving -Procalcitonin was 1.95 and will trend -> 1.25 -Patient was bolused 1 L of Saline because of Hypotension -C/w D5 NS at a rate of 100 mL/hr -Restarted Midodrine 20 mg TID -Infectious Disease Consultation appreciated. Dr. Wilder Glade changed Abx and D/C'd Aztreonam and Vancomycin because patient had neck swelling from Abx. Gave Solumedrol   -Infectious Diseases Started Linezolid and Tigecycline -Blood Cx showed Streptococcus Species in BLOOD , Urine Cx Pending, MRAS PCR + -Discussed with IR for Tissue Biopsy in Am; Will not Purse per Dr. Toni Amend Hoss in IR as it is contiguous -Consulted Plastic Surgery Dr. Marla Roe for Wound Debridement and Bx -Will be Take to OR tomorrow  HCAP with Recent Aspiration PNA -C/w Chlorhexidine Gluconate -Chronic Tracheostomy -C/w Albuterol Nebs 4 Times a day Dr. Wilder Glade changed Abx and D/C'd Aztreonam and Vancomycin because patient had neck swelling from Abx. Gave Solumedrol   -Abx Changed to Linezolid and Tigecycline  Encephalopathy -Likely Multifactorial but suspected to be from his illness -Head CT Negative and showed no acute Process -Patient Waxing and Waning -If worsens will consider Neuro Consult  Hypoglycemia in the Setting of  Diabetes Mellitus Type 1 -Patient's BS was 22 this AM on BMP and 26 on CBG -Given AMP of D50 yesterday; and started on D5 NS at a rate of 100 mL/hr -Has Sensitive Novolog SSI   Hyponatremia -Na+ was 126 this AM -Likely from Dehydration and Hypovolemia from Blood Loss -C/w IVF -Repeat BMP in AM  Hypotension -Gave 1 L of NS bolus and Transfusion of 2 units of pRBC's -Restarted Midodrine 20 mg po 3 times a daily with meals -Continue to Monitor  Watery Stools r/o C Difficile given recent Abx Usage -R/o C Difficile -Continue to Monitor stool output  Suspected Acute Lower GI Bleed and Blood Loss Anemia in the Setting of Steroid Use s/p Transfusion of 2 units of pRBC's -FOBT + -Hb/Hct went from 7.9/24.6 -> 7.0/22.0 -> 10.7/32.1 -Type and Screen and Transfuse 2 units of pRBC's -GI Consulted for Evaluation and Management -GI was going to do Bowel Prep and Colonoscopy in AM but patient's Mental Status prohibited it from getting done and GI would like patient more alert -Repeat CBC in Am  Leukocytosis in the setting of Infection, GI Bleed, and Steroid Use -WBC was 30.8 on Admission -> went to 36.5 -Changed Abx to Linezolid and Tigecycline  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 C6 Spinal Cord Infarct complicated by Neurogenic Bladder and Chronic Large Sacral Ulcer -C/w Indwelling Foley and with Oxybutynin 5 mg per Peg Tube BID -C/w Pantoprazole 40 mg po Daily -IV Abx -C/w Baclofen 20 mg po 4 times daily  Large Sacral Decubitus Ulcers with Posterior iliac Bone with Chronic Osteomyelitis s/p Diverting Colostomy and Resection of Coccyx and Distal Sacrum -WOUND Nurse Recommendations appreciated -Infectious Disease Consultation appreciated. Dr. Wilder Glade changed Abx and D/C'd Aztreonam and Vancomycin because patient had neck swelling from Abx. Gave Solumedrol   -Infectious Diseases Started Linezolid and Tigecycline  Hypophosphetemia -Repleted with Potassium Phosphate 40 mEQ  Leaking G  Tube -IR Consulted and Replaced Gastric Bumper without complication; -Tube was supposed to be changed today but upon Further discussion with IR hesitant to do it at this point as the G Tube is permanent with pull through. Would like patient more Alert.   Hx of PE s/p IVC Filter -Not on Anticoagulation   DVT prophylaxis: SCDs Code Status: FULL Family Communication: Discussed with Mother at bedside Disposition Plan: Remain in SDU today  Consultants:   Infectious Diseases  Gastroenterology  Interventional Radiology  PCCM  Plastic Surgery  Procedures: OR tomorrow for wound Debridement  Antimicrobials: IV Vancomycin and IV Aztreonam D/C'd. Switched to IV Linezolid and IV Tigecycline.  Subjective: Seen and examined this Am and was somnolent again but when woken up he responded to painful stimuli. [name] states his mental status improving.   Objective: Vitals:   08/28/16 1200 08/28/16 1400 08/28/16 1529 08/28/16 2016  BP:  131/84 135/82   Pulse:  70 76 79  Resp:  (!) 9 20   Temp: 97.5 F (36.4 C)     TempSrc: Axillary     SpO2:   100%   Weight:      Height:        Intake/Output Summary (Last 24 hours) at 08/28/16 2140 Last data filed at 08/28/16 1800  Gross per 24 hour  Intake  2263.33 ml  Output              500 ml  Net          1763.33 ml   Filed Weights   08/18/2016 1025  Weight: 54.9 kg (121 lb)   Examination: Physical Exam:  Constitutional: Thin ill appearing AAM who is somnolent and arousable to painful stimuli Eyes: lids normal but there is conjuctival pallor, sclerae anicteric  ENMT: External Ears, Nose appear normal. Grossly normal hearing.  Neck: Appears normal, supple, no cervical masses, normal ROM, no appreciable thyromegaly. Tracheostomy in place Respiratory: Diminished bilaterally, no wheezing, rales, rhonchi or crackles. Normal respiratory effort and patient is not tachypenic. No accessory muscle use.  Cardiovascular: RRR, no  murmurs / rubs / gallops. S1 and S2 auscultated. No extremity edema. Abdomen: Soft, non-tender, non-distended. No masses palpated. No appreciable hepatosplenomegaly. Bowel sounds positive. PEG in place and Ostomy bag with mild blood.   GU: Suprapubic Catheter in place draining yellow urine.  Musculoskeletal: No clubbing / cyanosis of digits/nails. Mild Contractures Skin: Warm and Dry with Large Sacral ulcer and wounds on heels. Pink and dry on some.  Neurologic: Not awake or alert. Arousable to painful stimuli Psychiatric: Unable to assess because of current condition.   Data Reviewed: I have personally reviewed following labs and imaging studies  CBC:  Recent Labs Lab 09/03/2016 1116 08/27/16 0950 08/27/16 2030 08/28/16 0415  WBC 30.8* 29.1*  --  36.5*  NEUTROABS 27.4* 26.5*  --  31.7*  HGB 7.9* 7.0* 10.9* 10.7*  HCT 24.6* 22.0* 32.4* 32.1*  MCV 83.1 83.0  --  81.3  PLT 503* 372  --  123456*   Basic Metabolic Panel:  Recent Labs Lab 09/03/2016 1116 08/27/16 0950 08/28/16 0415  NA 123* 127* 126*  K 4.2 3.7 4.6  CL 99* 104 103  CO2 20* 20* 18*  GLUCOSE 125* 22* 263*  BUN 40* 40* 42*  CREATININE 0.76 0.70 1.00  CALCIUM 8.7* 8.4* 8.2*  MG  --  1.9 1.9  PHOS  --  <1.0* 3.3   GFR: Estimated Creatinine Clearance: 82.4 mL/min (by C-G formula based on SCr of 1 mg/dL). Liver Function Tests:  Recent Labs Lab 08/29/2016 1116 08/27/16 0950 08/28/16 0415  AST 14* 15 14*  ALT 10* 11* 12*  ALKPHOS 92 100 117  BILITOT 0.3 0.2* 0.4  PROT 4.9* 4.6* 5.4*  ALBUMIN 1.5* 1.4* 1.6*    Recent Labs Lab 08/30/2016 1116  LIPASE 13   No results for input(s): AMMONIA in the last 168 hours. Coagulation Profile:  Recent Labs Lab 08/12/2016 1756  INR 1.24   Cardiac Enzymes: No results for input(s): CKTOTAL, CKMB, CKMBINDEX, TROPONINI in the last 168 hours. BNP (last 3 results) No results for input(s): PROBNP in the last 8760 hours. HbA1C: No results for input(s): HGBA1C in the last  72 hours. CBG:  Recent Labs Lab 08/27/16 2107 08/28/16 0822 08/28/16 1213 08/28/16 1713 08/28/16 2113  GLUCAP 205* 263* 236* 179* 168*   Lipid Profile: No results for input(s): CHOL, HDL, LDLCALC, TRIG, CHOLHDL, LDLDIRECT in the last 72 hours. Thyroid Function Tests: No results for input(s): TSH, T4TOTAL, FREET4, T3FREE, THYROIDAB in the last 72 hours. Anemia Panel: No results for input(s): VITAMINB12, FOLATE, FERRITIN, TIBC, IRON, RETICCTPCT in the last 72 hours. Sepsis Labs:  Recent Labs Lab 08/18/2016 1127 08/23/2016 1756 08/25/2016 1757 09/04/2016 2018 08/28/16 0415  PROCALCITON  --  1.95  --   --  1.25  LATICACIDVEN 0.62  --  0.6 0.8  --     Recent Results (from the past 240 hour(s))  Urine culture     Status: Abnormal (Preliminary result)   Collection Time: 08/25/2016 12:04 PM  Result Value Ref Range Status   Specimen Description URINE, SUPRAPUBIC  Final   Special Requests NONE  Final   Culture (A)  Final    >=100,000 COLONIES/mL ENTEROCOCCUS FAECALIS 20,000 COLONIES/mL GRAM NEGATIVE RODS    Report Status PENDING  Incomplete  Blood Culture (routine x 2)     Status: None (Preliminary result)   Collection Time: 09/04/2016 12:07 PM  Result Value Ref Range Status   Specimen Description BLOOD LEFT WRIST  Final   Special Requests BOTTLES DRAWN AEROBIC AND ANAEROBIC 10CC  Final   Culture  Setup Time   Final    ANAEROBIC BOTTLE ONLY GRAM POSITIVE COCCI IN PAIRS AND CHAINS CRITICAL RESULT CALLED TO, READ BACK BY AND VERIFIED WITH: ANDY [place] @0013  08/28/16 MKELLY,MLT    Culture GRAM POSITIVE COCCI  Final   Report Status PENDING  Incomplete  Blood Culture ID Panel (Reflexed)     Status: Abnormal   Collection Time: 08/12/2016 12:07 PM  Result Value Ref Range Status   Enterococcus species NOT DETECTED NOT DETECTED Final   Listeria monocytogenes NOT DETECTED NOT DETECTED Final   Staphylococcus species NOT DETECTED NOT DETECTED Final   Staphylococcus aureus NOT  DETECTED NOT DETECTED Final   Streptococcus species DETECTED (A) NOT DETECTED Final    Comment: CRITICAL RESULT CALLED TO, READ BACK BY AND VERIFIED WITH: ANDY JOHNST[place],PHARMD @0013  08/28/16 MKELLY,MLT    Streptococcus agalactiae NOT DETECTED NOT DETECTED Final   Streptococcus pneumoniae NOT DETECTED NOT DETECTED Final   Streptococcus pyogenes NOT DETECTED NOT DETECTED Final   Acinetobacter baumannii NOT DETECTED NOT DETECTED Final   Enterobacteriaceae species NOT DETECTED NOT DETECTED Final   Enterobacter cloacae complex NOT DETECTED NOT DETECTED Final   Escherichia coli NOT DETECTED NOT DETECTED Final   Klebsiella oxytoca NOT DETECTED NOT DETECTED Final   Klebsiella pneumoniae NOT DETECTED NOT DETECTED Final   Proteus species NOT DETECTED NOT DETECTED Final   Serratia marcescens NOT DETECTED NOT DETECTED Final   Haemophilus influenzae NOT DETECTED NOT DETECTED Final   Neisseria meningitidis NOT DETECTED NOT DETECTED Final   Pseudomonas aeruginosa NOT DETECTED NOT DETECTED Final   Candida albicans NOT DETECTED NOT DETECTED Final   Candida glabrata NOT DETECTED NOT DETECTED Final   Candida krusei NOT DETECTED NOT DETECTED Final   Candida parapsilosis NOT DETECTED NOT DETECTED Final   Candida tropicalis NOT DETECTED NOT DETECTED Final  Culture, blood (x 2)     Status: None (Preliminary result)   Collection Time: 08/25/2016  6:10 PM  Result Value Ref Range Status   Specimen Description BLOOD RIGHT HAND  Final   Special Requests IN PEDIATRIC BOTTLE 2CC  Final   Culture NO GROWTH 2 DAYS  Final   Report Status PENDING  Incomplete  Culture, blood (x 2)     Status: None (Preliminary result)   Collection Time: 08/18/2016  6:14 PM  Result Value Ref Range Status   Specimen Description BLOOD RIGHT THUMB  Final   Special Requests IN PEDIATRIC BOTTLE 1CC  Final   Culture NO GROWTH 2 DAYS  Final   Report Status PENDING  Incomplete  MRSA PCR Screening     Status: Abnormal   Collection Time:  08/27/16  3:50 AM  Result Value Ref Range Status   MRSA by PCR POSITIVE (A)  NEGATIVE Final    Comment:        The GeneXpert MRSA Assay (FDA approved for NASAL specimens only), is one component of a comprehensive MRSA colonization surveillance program. It is not intended to diagnose MRSA infection nor to guide or monitor treatment for MRSA infections. RESULT CALLED TO, READ BACK BY AND VERIFIED WITH: EVERETTE,D RN 08/27/16 AT 0515 [facility]     Radiology Studies: Ct Head Wo Contrast  Result Date: 08/28/2016 CLINICAL DATA:  Decreased in responsiveness EXAM: CT HEAD WITHOUT CONTRAST TECHNIQUE: Contiguous axial images were obtained from the base of the skull through the vertex without intravenous contrast. COMPARISON:  CT brain scan of 08/13/2016 FINDINGS: Brain: The ventricular system is unchanged and there are prominent cortical sulci consistent with atrophy. The septum remains midline in position. The fourth ventricle and basilar cisterns are unremarkable. No hemorrhage, mass lesion, or acute infarction is seen. Vascular: No vascular abnormality is seen on this unenhanced study. Skull: No calvarial abnormality is noted. Sinuses/Orbits: The paranasal sinuses are pneumatized with only mild mucosal thickening within the sphenoid sinus. Other: None IMPRESSION: 1. Mild atrophy.  No acute intracranial abnormality. 2. Mucosal thickening within the sphenoid sinus. No air-fluid level is seen. Electronically Signed   By: [name]   On: 08/28/2016 12:10   Dg Chest Port 1 View  Result Date: 08/28/2016 CLINICAL DATA:  Tracheostomy patient, followup EXAM: PORTABLE CHEST 1 VIEW COMPARISON:  Portable chest x-ray of 08/25/2016 FINDINGS: The tracheostomy remains in good position well above the carina. Bibasilar volume loss is unchanged. Heart size is stable. Right PICC line tip overlies the lower SVC. IMPRESSION: 1. No significant change in bibasilar atelectasis. 2. Tracheostomy and right PICC line  unchanged in position. Electronically Signed   By: [name]   On: 08/28/2016 08:02   Scheduled Meds: . albuterol  2.5 mg Nebulization QID  . baclofen  20 mg Oral QID  . buPROPion  150 mg Oral Daily  . Chlorhexidine Gluconate Cloth  6 each Topical Q0600  . fentaNYL  75 mcg Transdermal Q72H  . hydrocortisone  10 mg Oral QPM  . hydrocortisone  20 mg Oral q morning - 10a  . insulin aspart  0-9 Units Subcutaneous TID WC  . linezolid (ZYVOX) IV  600 mg Intravenous Q12H  . midodrine  20 mg Oral TID WC  . mupirocin ointment  1 application Nasal BID  . oxybutynin  5 mg Per Tube BID  . pantoprazole  40 mg Oral Daily  . pregabalin  100 mg Oral BID  . tigecycline (TYGACIL) IVPB  100 mg Intravenous Once   Followed by  . [START ON 08/14/2016] tigecycline (TYGACIL) IVPB  50 mg Intravenous Q12H   Continuous Infusions: . dextrose 5 % and 0.9% NaCl 100 mL/hr at 08/28/16 1700    LOS: 2 days   Kerney Elbe, DO [facility] Pager [phone]  If 7PM-7AM, please contact night-coverage [email or web address] Password [email or web address] 08/28/2016, 9:40 PM

## 2016-08-29 ENCOUNTER — Inpatient Hospital Stay (HOSPITAL_COMMUNITY): Payer: Medicaid Other

## 2016-08-29 ENCOUNTER — Encounter (HOSPITAL_COMMUNITY): Payer: Self-pay | Admitting: Plastic Surgery

## 2016-08-29 ENCOUNTER — Encounter (HOSPITAL_COMMUNITY): Admission: EM | Disposition: E | Payer: Self-pay | Source: Home / Self Care | Attending: Pulmonary Disease

## 2016-08-29 ENCOUNTER — Inpatient Hospital Stay (HOSPITAL_COMMUNITY): Payer: Medicaid Other | Admitting: Anesthesiology

## 2016-08-29 ENCOUNTER — Other Ambulatory Visit: Payer: Self-pay | Admitting: Family Medicine

## 2016-08-29 ENCOUNTER — Ambulatory Visit: Payer: Self-pay | Admitting: Plastic Surgery

## 2016-08-29 DIAGNOSIS — D72829 Elevated white blood cell count, unspecified: Secondary | ICD-10-CM

## 2016-08-29 DIAGNOSIS — R8271 Bacteriuria: Secondary | ICD-10-CM

## 2016-08-29 DIAGNOSIS — B955 Unspecified streptococcus as the cause of diseases classified elsewhere: Secondary | ICD-10-CM

## 2016-08-29 DIAGNOSIS — J90 Pleural effusion, not elsewhere classified: Secondary | ICD-10-CM

## 2016-08-29 DIAGNOSIS — M4658 Other infective spondylopathies, sacral and sacrococcygeal region: Secondary | ICD-10-CM

## 2016-08-29 DIAGNOSIS — L98424 Non-pressure chronic ulcer of back with necrosis of bone: Secondary | ICD-10-CM

## 2016-08-29 DIAGNOSIS — K922 Gastrointestinal hemorrhage, unspecified: Secondary | ICD-10-CM

## 2016-08-29 DIAGNOSIS — M4856XG Collapsed vertebra, not elsewhere classified, lumbar region, subsequent encounter for fracture with delayed healing: Secondary | ICD-10-CM

## 2016-08-29 DIAGNOSIS — Z960 Presence of urogenital implants: Secondary | ICD-10-CM

## 2016-08-29 DIAGNOSIS — Z931 Gastrostomy status: Secondary | ICD-10-CM

## 2016-08-29 DIAGNOSIS — G061 Intraspinal abscess and granuloma: Secondary | ICD-10-CM

## 2016-08-29 HISTORY — PX: INCISION AND DRAINAGE OF WOUND: SHX1803

## 2016-08-29 LAB — COMPREHENSIVE METABOLIC PANEL
ALBUMIN: 1.5 g/dL — AB (ref 3.5–5.0)
ALT: 12 U/L — ABNORMAL LOW (ref 17–63)
ANION GAP: 6 (ref 5–15)
AST: 11 U/L — AB (ref 15–41)
Alkaline Phosphatase: 116 U/L (ref 38–126)
BILIRUBIN TOTAL: 0.3 mg/dL (ref 0.3–1.2)
BUN: 42 mg/dL — ABNORMAL HIGH (ref 6–20)
CHLORIDE: 107 mmol/L (ref 101–111)
CO2: 16 mmol/L — ABNORMAL LOW (ref 22–32)
Calcium: 8 mg/dL — ABNORMAL LOW (ref 8.9–10.3)
Creatinine, Ser: 1.23 mg/dL (ref 0.61–1.24)
GFR calc Af Amer: >60 mL/min (ref >60)
GFR calc non Af Amer: >60 mL/min (ref >60)
GLUCOSE: 244 mg/dL — AB (ref 65–99)
POTASSIUM: 4.8 mmol/L (ref 3.5–5.1)
Sodium: 129 mmol/L — ABNORMAL LOW (ref 135–145)
TOTAL PROTEIN: 5.4 g/dL — AB (ref 6.5–8.1)

## 2016-08-29 LAB — CBC WITH DIFFERENTIAL/PLATELET
BASOS ABS: 0 10*3/uL (ref 0.0–0.1)
Basophils Relative: 0 %
EOS ABS: 0 10*3/uL (ref 0.0–0.7)
Eosinophils Relative: 0 %
HEMATOCRIT: 33.2 % — AB (ref 39.0–52.0)
Hemoglobin: 11 g/dL — ABNORMAL LOW (ref 13.0–17.0)
LYMPHS ABS: 1.4 10*3/uL (ref 0.7–4.0)
Lymphocytes Relative: 4 %
MCH: 27.1 pg (ref 26.0–34.0)
MCHC: 33.1 g/dL (ref 30.0–36.0)
MCV: 81.8 fL (ref 78.0–100.0)
MONO ABS: 1.4 10*3/uL — AB (ref 0.1–1.0)
MONOS PCT: 4 %
NEUTROS ABS: 31.3 10*3/uL — AB (ref 1.7–7.7)
NEUTROS PCT: 92 %
PLATELETS: 351 10*3/uL (ref 150–400)
RBC: 4.06 MIL/uL — AB (ref 4.22–5.81)
RDW: 17.2 % — AB (ref 11.5–15.5)
WBC: 34.1 10*3/uL — ABNORMAL HIGH (ref 4.0–10.5)

## 2016-08-29 LAB — BLOOD GAS, ARTERIAL
Acid-base deficit: 11.6 mmol/L — ABNORMAL HIGH (ref 0.0–2.0)
BICARBONATE: 14.4 mmol/L — AB (ref 20.0–28.0)
Drawn by: 344801
FIO2: 0.28
O2 Saturation: 96.8 %
PCO2 ART: 35.3 mmHg (ref 32.0–48.0)
PO2 ART: 90.7 mmHg (ref 83.0–108.0)
Patient temperature: 98.6
pH, Arterial: 7.234 — ABNORMAL LOW (ref 7.350–7.450)

## 2016-08-29 LAB — GLUCOSE, CAPILLARY
GLUCOSE-CAPILLARY: 209 mg/dL — AB (ref 65–99)
GLUCOSE-CAPILLARY: 223 mg/dL — AB (ref 65–99)
GLUCOSE-CAPILLARY: 254 mg/dL — AB (ref 65–99)
GLUCOSE-CAPILLARY: 216 mg/dL — AB (ref 65–99)
GLUCOSE-CAPILLARY: 176 mg/dL — AB (ref 65–99)
GLUCOSE-CAPILLARY: 179 mg/dL — AB (ref 65–99)
GLUCOSE-CAPILLARY: 178 mg/dL — AB (ref 65–99)

## 2016-08-29 LAB — URINE CULTURE

## 2016-08-29 LAB — LACTIC ACID, PLASMA: Lactic Acid, Venous: 1.5 mmol/L (ref 0.5–1.9)

## 2016-08-29 LAB — PHOSPHORUS: Phosphorus: 4 mg/dL (ref 2.5–4.6)

## 2016-08-29 LAB — MAGNESIUM: MAGNESIUM: 1.8 mg/dL (ref 1.7–2.4)

## 2016-08-29 LAB — AMMONIA: Ammonia: 39 umol/L — ABNORMAL HIGH (ref 9–35)

## 2016-08-29 SURGERY — COLONOSCOPY
Anesthesia: Monitor Anesthesia Care

## 2016-08-29 SURGERY — IRRIGATION AND DEBRIDEMENT WOUND
Anesthesia: General | Site: Back

## 2016-08-29 MED ORDER — INSULIN ASPART 100 UNIT/ML ~~LOC~~ SOLN
0.0000 [IU] | SUBCUTANEOUS | Status: DC
  Administered 2016-08-29 (×2): 2 [IU] via SUBCUTANEOUS
  Administered 2016-08-30: 1 [IU] via SUBCUTANEOUS
  Administered 2016-08-30 (×3): 2 [IU] via SUBCUTANEOUS
  Administered 2016-08-31 (×2): 1 [IU] via SUBCUTANEOUS
  Administered 2016-08-31: 2 [IU] via SUBCUTANEOUS
  Administered 2016-09-01: 1 [IU] via SUBCUTANEOUS
  Administered 2016-09-01 – 2016-09-02 (×6): 2 [IU] via SUBCUTANEOUS
  Administered 2016-09-02 – 2016-09-03 (×3): 1 [IU] via SUBCUTANEOUS

## 2016-08-29 MED ORDER — NALOXONE HCL 0.4 MG/ML IJ SOLN
INTRAMUSCULAR | Status: AC
  Administered 2016-08-29: 0.4 mg via INTRAVENOUS
  Filled 2016-08-29: qty 1

## 2016-08-29 MED ORDER — 0.9 % SODIUM CHLORIDE (POUR BTL) OPTIME
TOPICAL | Status: DC | PRN
  Administered 2016-08-29: 1000 mL

## 2016-08-29 MED ORDER — EPINEPHRINE PF 1 MG/ML IJ SOLN
INTRAMUSCULAR | Status: AC
  Filled 2016-08-29: qty 1

## 2016-08-29 MED ORDER — LIDOCAINE HCL 1 % IJ SOLN
INTRAMUSCULAR | Status: DC | PRN
  Administered 2016-08-29: .09 mL via INTRADERMAL

## 2016-08-29 MED ORDER — NALOXONE HCL 0.4 MG/ML IJ SOLN
0.4000 mg | INTRAMUSCULAR | Status: DC | PRN
  Administered 2016-08-29 (×2): 0.4 mg via INTRAVENOUS
  Filled 2016-08-29: qty 1

## 2016-08-29 MED ORDER — HEMOSTATIC AGENTS (NO CHARGE) OPTIME
TOPICAL | Status: DC | PRN
  Administered 2016-08-29: 1 via TOPICAL

## 2016-08-29 MED ORDER — LIDOCAINE HCL (PF) 1 % IJ SOLN
INTRAMUSCULAR | Status: AC
  Filled 2016-08-29: qty 30

## 2016-08-29 MED ORDER — SODIUM CHLORIDE 0.9 % IV SOLN
INTRAVENOUS | Status: DC | PRN
Start: 2016-08-29 — End: 2016-08-29
  Administered 2016-08-29 (×2): via INTRAVENOUS

## 2016-08-29 MED ORDER — JEVITY 1.2 CAL PO LIQD
1000.0000 mL | ORAL | Status: DC
  Administered 2016-08-30: 1000 mL
  Administered 2016-08-31: 20 mL/h
  Administered 2016-08-31: 1000 mL
  Filled 2016-08-29 (×7): qty 1000

## 2016-08-29 MED ORDER — EPINEPHRINE PF 1 MG/ML IJ SOLN
INTRAMUSCULAR | Status: DC | PRN
  Administered 2016-08-29: .15 mL

## 2016-08-29 MED ORDER — FENTANYL CITRATE (PF) 100 MCG/2ML IJ SOLN
INTRAMUSCULAR | Status: AC
  Filled 2016-08-29: qty 2

## 2016-08-29 MED ORDER — FUROSEMIDE 10 MG/ML IJ SOLN
20.0000 mg | Freq: Once | INTRAMUSCULAR | Status: AC
  Administered 2016-08-29: 20 mg via INTRAVENOUS
  Filled 2016-08-29: qty 2

## 2016-08-29 MED ORDER — ALBUTEROL SULFATE (2.5 MG/3ML) 0.083% IN NEBU: 2.5000 mg | INHALATION_SOLUTION | Freq: Four times a day (QID) | RESPIRATORY_TRACT | 5 refills | Status: AC

## 2016-08-29 MED ORDER — MIDAZOLAM HCL 2 MG/2ML IJ SOLN
INTRAMUSCULAR | Status: AC
  Filled 2016-08-29: qty 2

## 2016-08-29 SURGICAL SUPPLY — 47 items
APPLICATOR COTTON TIP 6IN STRL (MISCELLANEOUS) IMPLANT
BAG DECANTER FOR FLEXI CONT (MISCELLANEOUS) IMPLANT
BENZOIN TINCTURE PRP APPL 2/3 (GAUZE/BANDAGES/DRESSINGS) ×3 IMPLANT
BNDG GAUZE ELAST 4 BULKY (GAUZE/BANDAGES/DRESSINGS) ×3 IMPLANT
CANISTER SUCTION 2500CC (MISCELLANEOUS) ×3 IMPLANT
CONT SPEC STER OR (MISCELLANEOUS) IMPLANT
CONT SPECI 4OZ STER CLIK (MISCELLANEOUS) ×3 IMPLANT
COVER SURGICAL LIGHT HANDLE (MISCELLANEOUS) ×3 IMPLANT
DRAPE IMP U-DRAPE 54X76 (DRAPES) ×3 IMPLANT
DRAPE INCISE IOBAN 66X45 STRL (DRAPES) IMPLANT
DRAPE LAPAROSCOPIC ABDOMINAL (DRAPES) IMPLANT
DRAPE LAPAROTOMY 100X72 PEDS (DRAPES) ×3 IMPLANT
DRAPE PROXIMA HALF (DRAPES) IMPLANT
DRESSING HYDROCOLLOID 4X4 (GAUZE/BANDAGES/DRESSINGS) ×3 IMPLANT
DRSG ADAPTIC 3X8 NADH LF (GAUZE/BANDAGES/DRESSINGS) IMPLANT
DRSG PAD ABDOMINAL 8X10 ST (GAUZE/BANDAGES/DRESSINGS) ×6 IMPLANT
DRSG VAC ATS LRG SENSATRAC (GAUZE/BANDAGES/DRESSINGS) IMPLANT
DRSG VAC ATS MED SENSATRAC (GAUZE/BANDAGES/DRESSINGS) IMPLANT
DRSG VAC ATS SM SENSATRAC (GAUZE/BANDAGES/DRESSINGS) IMPLANT
ELECT CAUTERY BLADE 6.4 (BLADE) IMPLANT
ELECT REM PT RETURN 9FT ADLT (ELECTROSURGICAL) ×3
ELECTRODE REM PT RTRN 9FT ADLT (ELECTROSURGICAL) ×1 IMPLANT
GAUZE SPONGE 4X4 12PLY STRL (GAUZE/BANDAGES/DRESSINGS) IMPLANT
GLOVE BIO SURGEON STRL SZ 6.5 (GLOVE) ×2 IMPLANT
GLOVE BIO SURGEONS STRL SZ 6.5 (GLOVE) ×1
GOWN STRL REUS W/ TWL LRG LVL3 (GOWN DISPOSABLE) ×3 IMPLANT
GOWN STRL REUS W/TWL LRG LVL3 (GOWN DISPOSABLE) ×6
KIT BASIN OR (CUSTOM PROCEDURE TRAY) ×3 IMPLANT
KIT ROOM TURNOVER OR (KITS) ×3 IMPLANT
NEEDLE FILTER BLUNT 18X 1/2SAF (NEEDLE) ×2
NEEDLE FILTER BLUNT 18X1 1/2 (NEEDLE) ×1 IMPLANT
NEEDLE HYPO 25GX1X1/2 BEV (NEEDLE) ×6 IMPLANT
NS IRRIG 1000ML POUR BTL (IV SOLUTION) ×3 IMPLANT
PACK GENERAL/GYN (CUSTOM PROCEDURE TRAY) ×3 IMPLANT
PACK UNIVERSAL I (CUSTOM PROCEDURE TRAY) ×3 IMPLANT
PAD ARMBOARD 7.5X6 YLW CONV (MISCELLANEOUS) ×6 IMPLANT
STAPLER VISISTAT 35W (STAPLE) ×3 IMPLANT
SURGILUBE 2OZ TUBE FLIPTOP (MISCELLANEOUS) IMPLANT
SUT MNCRL AB 4-0 PS2 18 (SUTURE) IMPLANT
SUT VIC AB 5-0 PS2 18 (SUTURE) IMPLANT
SWAB COLLECTION DEVICE MRSA (MISCELLANEOUS) ×3 IMPLANT
SWAB CULTURE ESWAB REG 1ML (MISCELLANEOUS) ×3 IMPLANT
SYR CONTROL 10ML LL (SYRINGE) ×6 IMPLANT
SYR TB 1ML LUER SLIP (SYRINGE) ×3 IMPLANT
TOWEL OR 17X26 10 PK STRL BLUE (TOWEL DISPOSABLE) ×3 IMPLANT
TUBE ANAEROBIC SPECIMEN COL (MISCELLANEOUS) IMPLANT
UNDERPAD 30X30 (UNDERPADS AND DIAPERS) ×3 IMPLANT

## 2016-08-29 NOTE — Brief Op Note (Signed)
08/09/2016 - 08/22/2016  4:12 PM  PATIENT:  Jason [name]  30 y.o. male  PRE-OPERATIVE DIAGNOSIS:  SACRAL ULCER  POST-OPERATIVE DIAGNOSIS:  SACRAL ULCER  PROCEDURE:  Procedure(s) with comments: IRRIGATION AND DEBRIDEMENT SACRAL ULCER (N/A) - Sacrum  SURGEON:  Surgeon(s) and Role:    * Jackie Russman S Maan Zarcone, DO - Primary  PHYSICIAN ASSISTANT: Shawn Rayburn, PA  ASSISTANTS: none   ANESTHESIA:   general  EBL:  Total I/O In: 910 [I.V.:550; NG/GT:60; IV Piggyback:300] Out: 100 [Urine:100]  BLOOD ADMINISTERED:none  DRAINS: none   LOCAL MEDICATIONS USED:  NONE  SPECIMEN:  Source of Specimen:  sacral bone  DISPOSITION OF SPECIMEN:  Micro  COUNTS:  YES  TOURNIQUET:  * No tourniquets in log *  DICTATION: .Dragon Dictation  PLAN OF CARE: Admit to inpatient   PATIENT DISPOSITION:  PACU - hemodynamically stable.   Delay start of Pharmacological VTE agent (>24hrs) due to surgical blood loss or risk of bleeding: no

## 2016-08-29 NOTE — Anesthesia Preprocedure Evaluation (Addendum)
Anesthesia Evaluation  Patient identified by MRN, date of birth, ID band Patient unresponsive    Reviewed: Allergy & Precautions, NPO status , Patient's Chart, lab work & pertinent test results  History of Anesthesia Complications Negative for: history of anesthetic complications  Airway Mallampati: Trach       Dental  (+) Missing   Pulmonary asthma , pneumonia, former smoker,    breath sounds clear to auscultation       Cardiovascular + dysrhythmias  Rhythm:Regular Rate:Normal     Neuro/Psych Seizures -,  PSYCHIATRIC DISORDERS Anxiety Depression  Neuromuscular disease CVA    GI/Hepatic Neg liver ROS, GERD  Medicated,  Endo/Other  diabetes, Type 1, Insulin Dependent  Renal/GU Renal disease     Musculoskeletal  (+) Arthritis , Fibromyalgia -  Abdominal   Peds  Hematology  (+) anemia ,   Anesthesia Other Findings   Reproductive/Obstetrics                            Anesthesia Physical  Anesthesia Plan  ASA: III  Anesthesia Plan: General   Post-op Pain Management:    Induction: Inhalational  Airway Management Planned: Tracheostomy  Additional Equipment: None  Intra-op Plan: Utilization of Controlled Hypotension per surrgeon request  Post-operative Plan:   Informed Consent: I have reviewed the patients History and Physical, chart, labs and discussed the procedure including the risks, benefits and alternatives for the proposed anesthesia with the patient or authorized representative who has indicated his/her understanding and acceptance.   Dental advisory given  Plan Discussed with: CRNA  Anesthesia Plan Comments: (Per Dr. Marla Roe pt has history of cyclical mental status where he has times of unresponsiveness but eventually returns to baseline. )      Anesthesia Quick Evaluation

## 2016-08-29 NOTE — Progress Notes (Signed)
Patient returned from PACU. Patient remains unresponsive to painful stimuli. Pupils non reactive. Also, RUE appears more edematous than previous assessment. Requested doppler to assess for possible DVT. Dr. Algis Liming notified.   Joellen Jersey, RN.

## 2016-08-29 NOTE — Transfer of Care (Signed)
Immediate Anesthesia Transfer of Care Note  Patient: AMIRI LANNIGAN  Procedure(s) Performed: Procedure(s) with comments: IRRIGATION AND DEBRIDEMENT SACRAL ULCER (N/A) - Sacrum  Patient Location: PACU  Anesthesia Type:General  Level of Consciousness: unresponsive as per baseline  Airway & Oxygen Therapy: Patient Spontanous Breathing  Post-op Assessment: Report given to RN and Post -op Vital signs reviewed and stable  Post vital signs: Reviewed and stable  Last Vitals:  Vitals:   08/27/2016 1313 08/16/2016 1620  BP: 119/72 115/78  Pulse: (!) 103 (P) 90  Resp: 13 (!) 6  Temp:  (P) 36.6 C    Last Pain:  Vitals:   08/21/2016 1620  TempSrc:   PainSc: (P) Asleep         Complications: No apparent anesthesia complications

## 2016-08-29 NOTE — Progress Notes (Signed)
Inpatient Diabetes Program Recommendations  AACE/ADA: New Consensus Statement on Inpatient Glycemic Control (2015)  Target Ranges:  Prepandial:   less than 140 mg/dL      Peak postprandial:   less than 180 mg/dL (1-2 hours)      Critically ill patients:  140 - 180 mg/dL   Lab Results  Component Value Date   GLUCAP 223 (H) 08/23/2016   HGBA1C 8.0 (H) 07/04/2016    Review of Glycemic Control Results for FREDA, MENDOSA (MRN [id number]) as of 08/24/2016 10:40  Ref. Range 08/28/2016 08:22 08/28/2016 12:13 08/28/2016 17:13 08/28/2016 21:13 08/09/2016 03:51 08/16/2016 08:39  Glucose-Capillary Latest Ref Range: 65 - 99 mg/dL 263 (H) 236 (H) 179 (H) 168 (H) 209 (H) 223 (H)   Diabetes history:DM1 Outpatient Diabetes medications: Lantus 22 units QHS, Novolog 0-10 units QID Current orders for Inpatient glycemic control: Novolog Sensitive TID  Inpatient Diabetes Program Recommendations:  Patient has DM type 1 and will need basal insulin to prevent from going into DKA when glucose trends increase. Please consider part of patient's home dose of basal insulin to start tonight or in the am, Lantus 8 units.     Thank you, Nani Gasser. Armie Moren, RN, MSN, CDE Inpatient Glycemic Control Team Team Pager [phone] (8am-5pm) 08/21/2016 10:40 AM

## 2016-08-29 NOTE — Progress Notes (Addendum)
Addendum  Received call from Dr.Snider who had discussed with Dr. Marla Roe who advised that patient had real bad sacral wounds with associated fracture and extensive I&D would put him at risk for bleeding and palliative care consultation was advised. I called and discussed at length with patient's mother via phone who expressed confidence that he would recover despite his current condition from her past experience in dealing with him. She kept reiterating regarding pre-medicating with steroids. At this time, she did not seem to be anywhere close to considering palliative care consultation or changing his CODE STATUS. Discussed again with ID who indicated that he will be seen by another ID M.D. in a.m. who can reassess and make further recommendations but to continue current hydrocortisone and not to escalate steroids. Patient is critically ill and at high risk for decline and even death. Discussed with RN and advised to resume tube feeds.  Vernell Leep, MD, FACP, FHM. [facility] Pager [phone]  If 7PM-7AM, please contact night-coverage [email or web address] Password [facility] 08/12/2016, 5:26 PM

## 2016-08-29 NOTE — Telephone Encounter (Signed)
Request from [employer] to resend in script for Albuterol nebulizer solution, should be 360 ml instead of the 75 ml that was sent in. I did resend script e-scribe to pharmacy.

## 2016-08-29 NOTE — Progress Notes (Signed)
Daily Rounding Note  08/31/2016, 9:43 AM  LOS: 3 days   SUBJECTIVE:   Chief complaint: bleeding per ostomy.  Scant.      The sacral wound debridement and biopsy now on hold due to persistent AMS  OBJECTIVE:         Vital signs in last 24 hours:    Temp:  [97.5 F (36.4 C)-98 F (36.7 C)] 97.6 F (36.4 C) (11/22 0843) Pulse Rate:  [70-84] 84 (11/22 0900) Resp:  [9-20] 16 (11/22 0900) BP: (108-135)/(71-84) 108/73 (11/22 0900) SpO2:  [100 %] 100 % (11/22 0900) FiO2 (%):  [28 %] 28 % (11/22 0843) Last BM Date: 09/01/2016 Filed Weights   08/28/2016 1025  Weight: 54.9 kg (121 lb)   General: unresponsive but did not vigourously attempt arousal.   Heart: RRR Chest: clear, unlabored breathing Neck: less swelling in anterior neck Abdomen:  NT.  Scant amount of dark water, not obviously bloody, in ostomy bag.  BS present  Extremities: global muscular atrophy.  Swelling in UE Neuro/Psych:  Unresponsive,  Not verbal.   Intake/Output from previous day: 11/21 0701 - 11/22 0700 In: 1000 [I.V.:1000] Out: 350 [Urine:350]  Intake/Output this shift: Total I/O In: 175 [I.V.:175] Out: 100 [Urine:100]  Lab Results:  Recent Labs  08/27/16 0950 08/27/16 2030 08/28/16 0415 08/16/2016 0615  WBC 29.1*  --  36.5* 34.1*  HGB 7.0* 10.9* 10.7* 11.0*  HCT 22.0* 32.4* 32.1* 33.2*  PLT 372  --  412* 351   BMET  Recent Labs  08/27/16 0950 08/28/16 0415 08/28/2016 0615  NA 127* 126* 129*  K 3.7 4.6 4.8  CL 104 103 107  CO2 20* 18* 16*  GLUCOSE 22* 263* 244*  BUN 40* 42* 42*  CREATININE 0.70 1.00 1.23  CALCIUM 8.4* 8.2* 8.0*   LFT  Recent Labs  08/27/16 0950 08/28/16 0415 09/01/2016 0615  PROT 4.6* 5.4* 5.4*  ALBUMIN 1.4* 1.6* 1.5*  AST 15 14* 11*  ALT 11* 12* 12*  ALKPHOS 100 117 116  BILITOT 0.2* 0.4 0.3   PT/INR  Recent Labs  08/18/2016 1756  LABPROT 15.6*  INR 1.24   Hepatitis Panel No results for  input(s): HEPBSAG, HCVAB, HEPAIGM, HEPBIGM in the last 72 hours.  Studies/Results: Ct Head Wo Contrast  Result Date: 08/28/2016 CLINICAL DATA:  Decreased in responsiveness EXAM: CT HEAD WITHOUT CONTRAST TECHNIQUE: Contiguous axial images were obtained from the base of the skull through the vertex without intravenous contrast. COMPARISON:  CT brain scan of 08/13/2016 FINDINGS: Brain: The ventricular system is unchanged and there are prominent cortical sulci consistent with atrophy. The septum remains midline in position. The fourth ventricle and basilar cisterns are unremarkable. No hemorrhage, mass lesion, or acute infarction is seen. Vascular: No vascular abnormality is seen on this unenhanced study. Skull: No calvarial abnormality is noted. Sinuses/Orbits: The paranasal sinuses are pneumatized with only mild mucosal thickening within the sphenoid sinus. Other: None IMPRESSION: 1. Mild atrophy.  No acute intracranial abnormality. 2. Mucosal thickening within the sphenoid sinus. No air-fluid level is seen. Electronically Signed   By: [name]   On: 08/28/2016 12:10   Dg Chest Port 1 View  Result Date: 08/28/2016 CLINICAL DATA:  Tracheostomy patient, followup EXAM: PORTABLE CHEST 1 VIEW COMPARISON:  Portable chest x-ray of 08/10/2016 FINDINGS: The tracheostomy remains in good position well above the carina. Bibasilar volume loss is unchanged. Heart size is stable. Right PICC line tip overlies the  lower SVC. IMPRESSION: 1. No significant change in bibasilar atelectasis. 2. Tracheostomy and right PICC line unchanged in position. Electronically Signed   By: [name]   On: 08/28/2016 08:02   Scheduled Meds: . albuterol  2.5 mg Nebulization QID  . Chlorhexidine Gluconate Cloth  6 each Topical Q0600  . hydrocortisone  10 mg Oral QPM  . hydrocortisone  20 mg Oral q morning - 10a  . insulin aspart  0-9 Units Subcutaneous TID WC  . linezolid (ZYVOX) IV  600 mg Intravenous Q12H  . midodrine   20 mg Oral TID WC  . mupirocin ointment  1 application Nasal BID  . oxybutynin  5 mg Per Tube BID  . pantoprazole  40 mg Oral Daily  . tigecycline (TYGACIL) IVPB  100 mg Intravenous Once   Followed by  . tigecycline (TYGACIL) IVPB  50 mg Intravenous Q12H   Continuous Infusions: . dextrose 5 % and 0.9% NaCl 100 mL/hr at 08/28/16 1700   PRN Meds:.acetaminophen, bisacodyl, naLOXone (NARCAN)  injection   ASSESMENT:   * Chronic anemia.  2 PRBCs 11/20 with excellent response and Hgb continues to rise.  On po Iron.   * Bleeding per diverting colostomy originally place 10/2014.  Low volume of blood persists. ? Colitis (ischemic, infectious, less likely IBD), ? UGI source given PPI replaced by pepcid and on Solumedrol for last 4 weeks?  Now back on po Protonix.   * Leaking per G tube. Leaking resolved after external bumper tightened/snugged.   *  Elevated BUN.  ? Due to UGI source of bleeding?   *  Strep bacteremia, sepsis.  UTI associated bacteremia (proteus,ecoli, group b strep), and pneumonia within the past month.  Now with concern for HCAP and vertebral osteomyelitis.  Chronic sacral decubs.  On Vanc and Aztreonam.  ID following.  Hypothermia of 11/20 resolved, [employer] blanket remains in place.  Planned sacral wound debridement and biopsy cancelled due to AMS.    *  Hyponatremia.    *  AMS.  Head CT non-revealing as to cause. Not much change after Narcan and removal of fentanyl patch this AM.  Suspect TME.    *  IDDM.  Sugars remain in mid 200s.   * Chronic PMC. Albumin is 1.4 currently. Likely contributing to anasarca.   * Dysphagia. Last SLP swallow eval 08/18/16: cleared for regular diet and thins (same as in 12/2015). Given appearance of debris in esophagus, wonder if he might need barium esophagram (not SLP study) if future issues with swallowing.   * Debris and fluid in esophagus, stomach. Established dx of gastroparesis. EGD in 04/2013: retained food  in stomach, mucosa normal but H Pylori + and treated with PPI/abx.   * Recent admission for aspiration PNA. Chronic resp failure, trach dependent. Many admissionswith recurrent acute resp failure.     PLAN   *  With resolution of blood per ostomy, there is no urgency to pursuing colonoscopy via ostomy.  Will check on pt in a few days to see if clinical status improved/stable enough to reconsider colonoscopy.   *  Would Resume his tube feeds.     Azucena Freed  08/21/2016, 9:43 AM Pager: [phone]

## 2016-08-29 NOTE — Progress Notes (Addendum)
[name] for [facility]ious Disease    Date of Admission:  08/15/2016   Total days of antibiotics 4        Day 3 linezolid/day 2 tigecycline           ID: [name] is a 30 y.o. 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 ([name])   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([name])   History of pulmonary embolism   DVT (deep venous thrombosis) ([name])   Severe protein-calorie malnutrition ([name])   Dysphagia, pharyngoesophageal phase   Decubitus ulcer of right perineal ischial region, stage 4 ([name])   Decubitus ulcer of left perineal ischial region, stage 4 ([name])   Sacral decubitus ulcer, stage IV ([name])   Multiple allergies   Recurrent UTI   Respiratory failure, chronic ([name])   Aspiration pneumonia of right lower lobe ([name])   Seizures ([name])   Anxiety state   Anemia of chronic disease   Hyponatremia   Encephalopathy   Other acute osteomyelitis, multiple sites ([name])    Subjective: This morning he is difficult to arouse including sternal rub.  Given 2 doses of narcan this morning. He remains afebrile, no need for warming blanket, BP normal range  24 hr event: assessed by plastic surgery and changed abtx due to concern for drug allergy  Medications:  . albuterol  2.5 mg Nebulization QID  . Chlorhexidine Gluconate Cloth  6 each Topical Q0600  . hydrocortisone  10 mg Oral QPM  . hydrocortisone  20 mg Oral q morning - 10a  . insulin aspart  0-9 Units Subcutaneous TID WC  . linezolid (ZYVOX) IV  600 mg Intravenous Q12H  . midodrine  20 mg Oral TID WC  . mupirocin ointment  1 application Nasal BID  . oxybutynin  5 mg Per Tube BID  . pantoprazole  40 mg Oral Daily  . tigecycline (TYGACIL) IVPB  100 mg Intravenous Once   Followed by  . tigecycline (TYGACIL) IVPB  50 mg Intravenous Q12H    Objective: Vital signs in last 24 hours: Temp:   [97.5 F (36.4 C)-98 F (36.7 C)] 97.6 F (36.4 C) (11/22 0843) Pulse Rate:  [70-87] 87 (11/22 0943) Resp:  [9-21] 21 (11/22 0943) BP: (108-135)/(71-84) 108/73 (11/22 0943) SpO2:  [100 %] 100 % (11/22 0943) FiO2 (%):  [28 %] 28 % (11/22 0943) Physical Exam  Constitutional: . Chronically ill. No distress.  HENT: minimally reactive pupils, conjunctival edema bilaterally Mouth/Throat: dry Neck: ? Swelling above trach ribbon, trach collar in place Cardiovascular: Normal rate, regular rhythm and normal heart sounds. Exam reveals no gallop and no friction rub.  No murmur heard.  Pulmonary/Chest: course rhonchi at right lower lateral region, clear on left  Abdominal: Soft.  Mildly distended Bowel sounds are decreased. He exhibits no distension.  peg in place, and suprapubic catheter in place Skin: Skin is warm and dry. No rash noted. No erythema.    Lab Results  Recent Labs  08/28/16 0415 08/16/2016 0615  WBC 36.5* 34.1*  HGB 10.7* 11.0*  HCT 32.1* 33.2*  NA 126* 129*  K 4.6 4.8  CL 103 107  CO2 18* 16*  BUN 42* 42*  CREATININE 1.00 1.23   Liver Panel  Recent Labs  08/28/16 0415 09/01/2016 0615  PROT 5.4* 5.4*  ALBUMIN 1.6* 1.5*  AST 14*  11*  ALT 12* 12*  ALKPHOS 117 116  BILITOT 0.4 0.3    Microbiology: 11/19 blood cx strep species in 3 of 3 sets 11/19 urine cx shows enterococcus,  Studies/Results: Ct Head Wo Contrast  Result Date: 08/28/2016 CLINICAL DATA:  Decreased in responsiveness EXAM: CT HEAD WITHOUT CONTRAST TECHNIQUE: Contiguous axial images were obtained from the base of the skull through the vertex without intravenous contrast. COMPARISON:  CT brain scan of 08/13/2016 FINDINGS: Brain: The ventricular system is unchanged and there are prominent cortical sulci consistent with atrophy. The septum remains midline in position. The fourth ventricle and basilar cisterns are unremarkable. No hemorrhage, mass lesion, or acute infarction is seen. Vascular: No vascular  abnormality is seen on this unenhanced study. Skull: No calvarial abnormality is noted. Sinuses/Orbits: The paranasal sinuses are pneumatized with only mild mucosal thickening within the sphenoid sinus. Other: None IMPRESSION: 1. Mild atrophy.  No acute intracranial abnormality. 2. Mucosal thickening within the sphenoid sinus. No air-fluid level is seen. Electronically Signed   By: [name]   On: 08/28/2016 12:10   Dg Chest Port 1 View  Result Date: 08/28/2016 CLINICAL DATA:  Tracheostomy patient, followup EXAM: PORTABLE CHEST 1 VIEW COMPARISON:  Portable chest x-ray of 08/22/2016 FINDINGS: The tracheostomy remains in good position well above the carina. Bibasilar volume loss is unchanged. Heart size is stable. Right PICC line tip overlies the lower SVC. IMPRESSION: 1. No significant change in bibasilar atelectasis. 2. Tracheostomy and right PICC line unchanged in position. Electronically Signed   By: [name]   On: 08/28/2016 08:02   Cxr: mild right sided effusion, pulmonary  Vascular congestion per my read today   Assessment/Plan:  Encephalopathy = likely from oversedating meds vs infection vs. Reaction to abtx . Will continue to monitor. Spoke with dr Algis Liming this am. Agree with plan to discontinue any meds that can cause CNS effect. Will give dose of narcan to see if any improvement. He had slight change with narcan, where he moved arm, wrinkled his face. Still not following commands.   Sacral osteo with associated septic SI joint and small abscess = presume that this is polymicrobial. Will need debridement. Appreciate dr. Eusebio Friendly efforts to take patient for biopsy and debridement, if stable for procedure. Currently on tigecycline since he has tolerated it in the past  Strep bacteremia = now in 3 of 3 sets of blood cultures.  covered by linezolid for now. Mother thinks he has been premedicated on admit, since " he gets premedicated for all his abtx". It does not appear that  is the case in this admission. Has tolerated, no evidence of intolerance.  Leukocytosis = likely due to underlying infection, will continue to monitor. Slightly improved on blood work  Gi bleed = hgb stable presently, no longer having any melena. endoscopy deferred until more stable.  Polymicrobial urine culture = he has suprapubic catheter and known to have colonization. Will focus on strep bacteremia and sacral osteo  Compression fracture and lumbar spine lucency = unclear if it is osteo vs. Compression from other causes. Once more stable, ti may be worth doing MRI of thoracic and lumbar spine to see if related to vertebral osteo  Multiple drug allergy = have listed aztreonam as part of his allergy list.   aki = worsening acute kidney injury up to cr of 1.23 from baseline of 0.7. Will continue to renally dose abtx  Hyponatremia = improving, thus less likely to be contributing to his  AMS  Severe protein-caloric Malnutrition = alb of 1.5 which also likely related to his anasarca, poor wound healing.  Baxter Flattery [facility] for Infectious Diseases Cell: [phone] Pager: [phone]  08/28/2016, 10:09 AM

## 2016-08-29 NOTE — Op Note (Signed)
DATE OF OPERATION: 08/08/2016  LOCATION: [name]  PREOPERATIVE DIAGNOSIS: sacral osteomyelitis  POSTOPERATIVE DIAGNOSIS: Same  PROCEDURE: Bone biopsy of sacral bone  SURGEON: Robert Sperl Sanger Dezra Mandella, DO  ASSISTANT: Shawn Rayburn, PA  EBL: minimal  CONDITION: Stable  COMPLICATIONS: None  INDICATION: The patient, Jason [name], is a 30 y.o. male born on 16-Jan-1986, is here for a bone biopsy of his sacrum.  He is in critical condition and has MRI results suggestive of osteomyelitis of the spine and sacral area.  He has severe reactions to multiple antibiotics.  The decision was made to get a bone biopsy for specific treatment targeting.   PROCEDURE DETAILS:  The patient was seen prior to surgery and marked.  The patient was taken to the operating room and given a general anesthetic. A standard time out was performed and all information was confirmed by those in the room. The patient was placed in the right lateral position and betadine used to prepare the local sacral area.  The sacral bone was exposed.  Several pieces of bone were removed with a ronguer and sent to the lab for gram stain culture and sensitivity (aerobic and anaerobic).  Fibrin strip was placed on the bone edge to help prevent bleeding.  The wounds were dressed with saline gauze and an ABD.  The patient was taken to recovery room in stable condition at the end of the case. The family was notified at the end of the case.

## 2016-08-29 NOTE — Progress Notes (Addendum)
PROGRESS NOTE    Jason [name]  [id number] DOB: 02-18-1986 DOA: 08/24/2016 PCP: [name], MD   Brief Narrative:  Jason [name] is a 30 y.o. 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 No apparent trauma to the abdomen recently. He did receive transfusions in the past, last one week ago during last admission. In addition, the patient had been increasingly lethargic, with decreased urine output over the last 3 days pta. Mother reported that the patient had large volume of watery diarrhea over the last  last 36 hours pta. No apparent fever or chills. There is no apparent bleeding from the tract site, or worsening breathing or increased respiratory secretions. He was admitted to SDU and Infectious Disease, Gastroenterology, Plastic surgery, PCCM and IR was consulted for further evaluation and management. PCCM was consulted to evaluate Swelling in Neck around Tracheostomy. Patient was unresponsive on 08/14/2016 but remained hemodynamically stable and maintaining airway.? Multifactorial related to profound sepsis, metabolic derangement and polypharmacy-were discontinued. Mild response with Narcan 2. Scheduled for biopsy and debridement by plastic surgery this afternoon-discussed with Dr. Marla Roe.  Assessment & Plan:   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 ([name])   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([place])   History of pulmonary embolism   DVT (deep venous thrombosis) ([name])   Severe protein-calorie malnutrition ([name])   Dysphagia, pharyngoesophageal phase   Decubitus ulcer of right  perineal ischial region, stage 4 ([name])   Decubitus ulcer of left perineal ischial region, stage 4 ([name])   Sacral decubitus ulcer, stage IV ([name])   Multiple allergies   Recurrent UTI   Respiratory failure, chronic ([name])   Aspiration pneumonia of right lower lobe ([name])   Seizures ([name])   Anxiety state   Anemia of chronic disease   Hyponatremia   Encephalopathy   Other acute osteomyelitis, multiple sites ([place])   Acute encephalopathy - Apparently has been in intermittent and ongoing problem since hospital admission. CT head negative for acute findings. Noted patient to be unresponsive to any stimuli including noxious stimuli on 11/22. This is likely multifactorial related to profound sepsis, metabolic derangement (metabolic acidosis) and polypharmacy (fentanyl patch, Lyrica, Wellbutrin, baclofen). Discontinued culprit medications. Trial of Narcan 2 with mild immediate response. As per RN report, mental status slowly improving since this morning. Continue to monitor closely. Check EEG. Ammonia minimally elevated and likely not contributing.  Sepsis likely Multifactorial Etiologies with Evidence of Septic Arthritis within the Right SI Joint and Acute Osteomyelitis in the Adjacent Right illiac Bone with Changes of Multi Level Osteomyelitis throughout Vertebral Bodies of T8, L1, L2, L4, and L5 with Intramuscular Abscesses involving the Psoas, iliopsoas and iliacus muscles on the Right with Concomitant Dense Consolidation in the Lower lobes B/L compatible with PNA and Streptococcus Bacteremia, poA - SIRS criteria resolved. -Procalcitonin was 1.95 and will trend -> 1.25 - Was hydrated with IV fluids. Currently appears volume overloaded as evidenced by clinical exam and chest x-ray findings. Reduce IV fluids. Hemodynamically stable/hypotension resolved. -Restarted Midodrine 20 mg TID -Infectious Disease Consultation appreciated. Dr. Wilder Glade changed Abx and D/C'd Aztreonam and Vancomycin because patient  had neck swelling  from Abx-? Allergic reaction to aztreonam. Gave Solumedrol 1 dose on 11/21   -Infectious Diseases Started Linezolid and Tigecycline -Blood Cx showed Streptococcus Species in BLOOD , Urine Cx : Polymicrobial (Enterococcus faecalis & Acinetobacter-possibly colonization rather than true infection), MRSA PCR + -Discussed with IR for Tissue Biopsy in Am; Will not Purse per Dr. Maryclare Bean in IR as it is contiguous -Consulted Plastic Surgery Dr. Marla Roe for Wound Debridement and Bx > scheduled for this procedure on 11/22 afternoon. Patient is hemodynamically stable and maintaining airway. Discussed with Dr. Marla Roe and will defer to her regarding decision to take to or today.  HCAP with Recent Aspiration PNA -Chronic Tracheostomy -C/w Albuterol Nebs 4 Times a day Dr. Wilder Glade changed Abx and D/C'd Aztreonam and Vancomycin because patient had neck swelling from Abx. Gave Solumedrol   -Abx Changed to Linezolid and Tigecycline - Aggressive pulmonary toilet.  Hypoglycemia in the Setting of Diabetes Mellitus Type 1/hyperglycemia -Patient was hypoglycemic with CBG in the 20s on 08/27/16. No hypoglycemia noted since then. Now hyperglycemic, likely contribute to dry Solu-Medrol that he received on 11/21. Reduce IV D5W due to hyperglycemia and volume overload and continue SSI. - Consider low-dose Lantus when patient resumes tube feeds and if CBGs remained consistently elevated. Currently without DKA.   Hyponatremia -Na+ was 126 11/21 which is improved to 129 on 11/22. Clinically improved. Follow BMP. - Now may be related to hypervolemia. Giving a dose of IV Lasix for volume overload. Follow BMP in a.m.  Hypotension -Gave 1 L of NS bolus and Transfusion of 2 units of pRBC's -Restarted Midodrine 20 mg po 3 times a daily with meals -Continue to Monitor. Resolved.  Watery Stools r/o C Difficile given recent Abx Usage -R/o C Difficile -Continue to Monitor stool output. Seems to have  improved.  Suspected Acute Lower GI Bleed and Blood Loss Anemia in the Setting of Steroid Use s/p Transfusion of 2 units of pRBC's -FOBT + -Hb/Hct went from 7.9/24.6 -> 7.0/22.0 -> 10.7/32.1 -Transfused 2 units of pRBC's -GI consultation and follow-up appreciated. Bleeding from diverting colostomy-low volume of blood persists >? Colitis (ischemic, infectious, less likely IBD versus UGI etiology given PPI replaced by Pepcid and on Solu-Medrol for 4 weeks prior to admission. Now back on Protonix. GI indicates that with resolution of blood per ostomy, that is no urgency to pursue colonoscopy via ostomy and I agree. GI will reassess in a few days. 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 Please see discussion above.  Hypophosphetemia -Repleted with Potassium Phosphate 40 mEQ  Leaking G Tube -IR Consulted and Replaced Gastric Bumper without complication; - IR  following patient and assessing possibility of tube change on 08/22/2016.  Hx of PE s/p IVC Filter -Not on Anticoagulation   Chronic respiratory failure/tracheostomy dependent/acute diastolic CHF - 2-D echo 123456 showed normal systolic function. Features suggestive of volume overload clinically and by chest x-ray. Likely due  to volume resuscitation for sepsis. Trial of Lasix 20 mg IV 1. Follow  clinically and chest x-ray periodically.  Acute kidney injury - Creatinine has come up from 1-1.23 since yesterday.? Related to sepsis. Clinically volume overloaded. Reduce IV fluids and trial of IV Lasix. Follow BMP in a.m.  Severe protein calorie malnutrition - Albumin 1.5, anasarca. Continue tube feeds when able.  Non-anion gap metabolic acidosis - on [id number]: Bicarbonate 16 and AG 6. Lactate normal. Likely related to sepsis. ABG reviewed: PH 7.234, PCO2 35, PO2 91, bicarbonate 14.4 and oxygen saturation 96.8. Follow BMP in a.m.    DVT prophylaxis: SCDs Code Status: FULL Family Communication: Discussed with Mother at bedside in detail. Updated care and answered questions. Advised her that her son is critically ill and could even worsen and she expressed understanding. She continues to indicate full code.  Disposition Plan: Remain in SDU today  Consultants:   Infectious Diseases   [facility]  Interventional Radiology  [place] 11/21 appreciated. Signed off.   Plastic Surgery  Procedures:   None. Has indwelling suprapubic catheter, PEG tube, colostomy and tracheostomy  Antimicrobials:   IV Vancomycin and IV Aztreonam D/C'd. Switched to IV Linezolid and IV Tigecycline.  Subjective: Patient seen this morning in stepdown unit. Mother at bedside. Unresponsive to all stimuli. As per mother, worsening mental status since day before yesterday. Did shout out last night but not hypoglycemic. After Narcan was administered, noted some facial movements and spontaneous movement of right upper extremity.  Objective: Vitals:   08/23/2016 0943 09/06/2016 1000 08/24/2016 1100 08/16/2016 1201  BP: 108/73 129/89 116/77 109/73  Pulse: 87 91 93 91  Resp: (!) 21 (!) 26 17 18   Temp:      TempSrc:      SpO2: 100% 100% 100% 100%  Weight:      Height:        Intake/Output Summary (Last 24 hours) at 09/04/2016 1252 Last data filed at 09/01/2016 0847  Gross per 24 hour  Intake               675 ml  Output              225 ml  Net              450 ml   Filed Weights   08/24/2016 1025  Weight: 54.9 kg (121 lb)   Examination: Physical Exam:  Constitutional: Thin chronically  ill appearing AAM who is somnolent and not arousable to any stimuli Eyes: lids normal but there is conjuctival pallor, sclerae anicteric. Conjunctival edema bilaterally.  Neck: Appears normal, supple, no cervical masses, normal ROM, no appreciable thyromegaly. Tracheostomy in place Respiratory: Decreased breath sounds bilaterally with wet sounding lungs. Occasional expiratory rhonchi. Basal crackles. No increased work of breathing.   Cardiovascular: RRR, no murmurs / rubs / gallops. S1 and S2 auscultated. No extremity edema. facial and upper extremity edema. Telemetry: Sinus rhythm.  Abdomen: Soft, non-tender, non-distended. No masses palpated. No appreciable hepatosplenomegaly. Bowel sounds positive. PEG in place and Ostomy bag with minimal dark liquid in bag. Suprapubic tube +.   GU: Suprapubic Catheter in place draining yellow urine.  Musculoskeletal: No clubbing / cyanosis of digits/nails. Mild Contractures Skin: Warm and Dry with Large Sacral ulcer and wounds on heels. Pink and dry on some.  Neurologic:  unresponsive to any stimuli. Bilateral pupils 3 mm sluggishly reacting to right. No focal neurological deficits. Psychiatric: Unable to assess because of current condition.   Data Reviewed: I  have personally reviewed following labs and imaging studies  CBC:  Recent Labs Lab 08/11/2016 1116 08/27/16 0950 08/27/16 2030 08/28/16 0415 09/01/2016 0615  WBC 30.8* 29.1*  --  36.5* 34.1*  NEUTROABS 27.4* 26.5*  --  31.7* 31.3*  HGB 7.9* 7.0* 10.9* 10.7* 11.0*  HCT 24.6* 22.0* 32.4* 32.1* 33.2*  MCV 83.1 83.0  --  81.3 81.8  PLT 503* 372  --  412* [id number]   Basic Metabolic Panel:  Recent Labs Lab 09/01/2016 1116 08/27/16 0950 08/28/16 0415 08/15/2016 0615  NA 123* 127* 126* 129*  K 4.2 3.7 4.6 4.8    CL 99* 104 103 107  CO2 20* 20* 18* 16*  GLUCOSE 125* 22* 263* 244*  BUN 40* 40* 42* 42*  CREATININE 0.76 0.70 1.00 1.23  CALCIUM 8.7* 8.4* 8.2* 8.0*  MG  --  1.9 1.9 1.8  PHOS  --  <1.0* 3.3 4.0   GFR: Estimated Creatinine Clearance: 67 mL/min (by C-G formula based on SCr of 1.23 mg/dL). Liver Function Tests:  Recent Labs Lab 08/17/2016 1116 08/27/16 0950 08/28/16 0415 08/19/2016 0615  AST 14* 15 14* 11*  ALT 10* 11* 12* 12*  ALKPHOS 92 100 117 116  BILITOT 0.3 0.2* 0.4 0.3  PROT 4.9* 4.6* 5.4* 5.4*  ALBUMIN 1.5* 1.4* 1.6* 1.5*    Recent Labs Lab 08/13/2016 1116  LIPASE 13    Recent Labs Lab 08/26/2016 1055  AMMONIA 39*   Coagulation Profile:  Recent Labs Lab 08/18/2016 1756  INR 1.24   Cardiac Enzymes: No results for input(s): CKTOTAL, CKMB, CKMBINDEX, TROPONINI in the last 168 hours. BNP (last 3 results) No results for input(s): PROBNP in the last 8760 hours. HbA1C: No results for input(s): HGBA1C in the last 72 hours. CBG:  Recent Labs Lab 08/28/16 1213 08/28/16 1713 08/28/16 2113 08/21/2016 0351 08/26/2016 0839  GLUCAP 236* 179* 168* 209* 223*   Lipid Profile: No results for input(s): CHOL, HDL, LDLCALC, TRIG, CHOLHDL, LDLDIRECT in the last 72 hours. Thyroid Function Tests: No results for input(s): TSH, T4TOTAL, FREET4, T3FREE, THYROIDAB in the last 72 hours. Anemia Panel: No results for input(s): VITAMINB12, FOLATE, FERRITIN, TIBC, IRON, RETICCTPCT in the last 72 hours. Sepsis Labs:  Recent Labs Lab 09/01/2016 1127 09/05/2016 1756 09/04/2016 1757 08/22/2016 2018 08/28/16 0415 08/28/2016 1055  PROCALCITON  --  1.95  --   --  1.25  --   LATICACIDVEN 0.62  --  0.6 0.8  --  1.5    Recent Results (from the past 240 hour(s))  Urine culture     Status: Abnormal   Collection Time: 08/28/2016 12:04 PM  Result Value Ref Range Status   Specimen Description URINE, SUPRAPUBIC  Final   Special Requests NONE  Final   Culture (A)  Final    >=100,000 COLONIES/mL  ENTEROCOCCUS FAECALIS >=100,000 COLONIES/mL YEAST 20,000 COLONIES/mL ACINETOBACTER CALCOACETICUS/BAUMANNII COMPLEX    Report Status 08/14/2016 FINAL  Final   Organism ID, Bacteria ENTEROCOCCUS FAECALIS (A)  Final   Organism ID, Bacteria ACINETOBACTER CALCOACETICUS/BAUMANNII COMPLEX (A)  Final      Susceptibility   Acinetobacter calcoaceticus/baumannii complex - MIC*    CEFTAZIDIME >=64 RESISTANT Resistant     CEFTRIAXONE >=64 RESISTANT Resistant     CIPROFLOXACIN >=4 RESISTANT Resistant     GENTAMICIN 4 SENSITIVE Sensitive     IMIPENEM 1 SENSITIVE Sensitive     PIP/TAZO <=4 SENSITIVE Sensitive     TRIMETH/SULFA >=320 RESISTANT Resistant     CEFEPIME >=64 RESISTANT Resistant  AMPICILLIN/SULBACTAM 4 SENSITIVE Sensitive     * 20,000 COLONIES/mL ACINETOBACTER CALCOACETICUS/BAUMANNII COMPLEX   Enterococcus faecalis - MIC*    AMPICILLIN <=2 SENSITIVE Sensitive     LEVOFLOXACIN >=8 RESISTANT Resistant     NITROFURANTOIN <=16 SENSITIVE Sensitive     VANCOMYCIN 1 SENSITIVE Sensitive     * >=100,000 COLONIES/mL ENTEROCOCCUS FAECALIS  Blood Culture (routine x 2)     Status: None (Preliminary result)   Collection Time: 08/09/2016 12:07 PM  Result Value Ref Range Status   Specimen Description BLOOD LEFT WRIST  Final   Special Requests BOTTLES DRAWN AEROBIC AND ANAEROBIC 10CC  Final   Culture  Setup Time   Final    IN BOTH AEROBIC AND ANAEROBIC BOTTLES GRAM POSITIVE COCCI IN PAIRS AND CHAINS CRITICAL RESULT CALLED TO, READ BACK BY AND VERIFIED WITH: ANDY Mayflower @0013  08/28/16 MKELLY,MLT    Culture GRAM POSITIVE COCCI  Final   Report Status PENDING  Incomplete  Blood Culture ID Panel (Reflexed)     Status: Abnormal   Collection Time: 09/04/2016 12:07 PM  Result Value Ref Range Status   Enterococcus species NOT DETECTED NOT DETECTED Final   Listeria monocytogenes NOT DETECTED NOT DETECTED Final   Staphylococcus species NOT DETECTED NOT DETECTED Final   Staphylococcus aureus NOT  DETECTED NOT DETECTED Final   Streptococcus species DETECTED (A) NOT DETECTED Final    Comment: CRITICAL RESULT CALLED TO, READ BACK BY AND VERIFIED WITH: ANDY JOHNSTON,PHARMD @0013  08/28/16 MKELLY,MLT    Streptococcus agalactiae NOT DETECTED NOT DETECTED Final   Streptococcus pneumoniae NOT DETECTED NOT DETECTED Final   Streptococcus pyogenes NOT DETECTED NOT DETECTED Final   Acinetobacter baumannii NOT DETECTED NOT DETECTED Final   Enterobacteriaceae species NOT DETECTED NOT DETECTED Final   Enterobacter cloacae complex NOT DETECTED NOT DETECTED Final   Escherichia coli NOT DETECTED NOT DETECTED Final   Klebsiella oxytoca NOT DETECTED NOT DETECTED Final   Klebsiella pneumoniae NOT DETECTED NOT DETECTED Final   Proteus species NOT DETECTED NOT DETECTED Final   Serratia marcescens NOT DETECTED NOT DETECTED Final   Haemophilus influenzae NOT DETECTED NOT DETECTED Final   Neisseria meningitidis NOT DETECTED NOT DETECTED Final   Pseudomonas aeruginosa NOT DETECTED NOT DETECTED Final   Candida albicans NOT DETECTED NOT DETECTED Final   Candida glabrata NOT DETECTED NOT DETECTED Final   Candida krusei NOT DETECTED NOT DETECTED Final   Candida parapsilosis NOT DETECTED NOT DETECTED Final   Candida tropicalis NOT DETECTED NOT DETECTED Final  Culture, blood (x 2)     Status: None (Preliminary result)   Collection Time: 09/05/2016  6:10 PM  Result Value Ref Range Status   Specimen Description BLOOD RIGHT HAND  Final   Special Requests IN PEDIATRIC BOTTLE 2CC  Final   Culture  Setup Time   Final    GRAM POSITIVE COCCI IN PAIRS IN CHAINS IN PEDIATRIC BOTTLE CRITICAL RESULT CALLED TO, READ BACK BY AND VERIFIED WITH: VERONDA BRYK,PHARMD @0206  08/21/2016 MKELLY,MLT    Culture TOO YOUNG TO READ  Final   Report Status PENDING  Incomplete  Culture, blood (x 2)     Status: None (Preliminary result)   Collection Time: 08/14/2016  6:14 PM  Result Value Ref Range Status   Specimen Description BLOOD RIGHT  THUMB  Final   Special Requests IN PEDIATRIC BOTTLE 1CC  Final   Culture  Setup Time   Final    GRAM POSITIVE COCCI IN PAIRS IN PEDIATRIC BOTTLE CRITICAL VALUE NOTED.  VALUE IS  CONSISTENT WITH PREVIOUSLY REPORTED AND CALLED VALUE.    Culture TOO YOUNG TO READ  Final   Report Status PENDING  Incomplete  MRSA PCR Screening     Status: Abnormal   Collection Time: 08/27/16  3:50 AM  Result Value Ref Range Status   MRSA by PCR POSITIVE (A) NEGATIVE Final    Comment:        The GeneXpert MRSA Assay (FDA approved for NASAL specimens only), is one component of a comprehensive MRSA colonization surveillance program. It is not intended to diagnose MRSA infection nor to guide or monitor treatment for MRSA infections. RESULT CALLED TO, READ BACK BY AND VERIFIED WITH: EVERETTE,D RN 08/27/16 AT 0515 [facility]     Radiology Studies: Ct Head Wo Contrast  Result Date: 08/28/2016 CLINICAL DATA:  Decreased in responsiveness EXAM: CT HEAD WITHOUT CONTRAST TECHNIQUE: Contiguous axial images were obtained from the base of the skull through the vertex without intravenous contrast. COMPARISON:  CT brain scan of 08/13/2016 FINDINGS: Brain: The ventricular system is unchanged and there are prominent cortical sulci consistent with atrophy. The septum remains midline in position. The fourth ventricle and basilar cisterns are unremarkable. No hemorrhage, mass lesion, or acute infarction is seen. Vascular: No vascular abnormality is seen on this unenhanced study. Skull: No calvarial abnormality is noted. Sinuses/Orbits: The paranasal sinuses are pneumatized with only mild mucosal thickening within the sphenoid sinus. Other: None IMPRESSION: 1. Mild atrophy.  No acute intracranial abnormality. 2. Mucosal thickening within the sphenoid sinus. No air-fluid level is seen. Electronically Signed   By: [name]   On: 08/28/2016 12:10   Dg Chest Port 1 View  Result Date: 08/25/2016 CLINICAL DATA:  Pneumonia  EXAM: PORTABLE CHEST 1 VIEW COMPARISON:  Chest x-ray of 08/28/2016 FINDINGS: A tracheostomy is again noted. Right PICC line tip overlies the lower SVC near the expected SVC -RA junction. There is moderate cardiomegaly present and there does appear to be at least a right pleural effusion. There is pulmonary vascular congestion, findings most consistent with mild congestive heart failure. Superimposed pneumonia would be difficult to exclude. IMPRESSION: 1. Suspect mild CHF with cardiomegaly, pulmonary vascular congestion, and small right pleural effusion. 2. Tracheostomy and right PICC line unchanged in position. Electronically Signed   By: [name]   On: 08/26/2016 10:15   Dg Chest Port 1 View  Result Date: 08/28/2016 CLINICAL DATA:  Tracheostomy patient, followup EXAM: PORTABLE CHEST 1 VIEW COMPARISON:  Portable chest x-ray of 08/29/2016 FINDINGS: The tracheostomy remains in good position well above the carina. Bibasilar volume loss is unchanged. Heart size is stable. Right PICC line tip overlies the lower SVC. IMPRESSION: 1. No significant change in bibasilar atelectasis. 2. Tracheostomy and right PICC line unchanged in position. Electronically Signed   By: [name]   On: 08/28/2016 08:02   Scheduled Meds: . albuterol  2.5 mg Nebulization QID  . Chlorhexidine Gluconate Cloth  6 each Topical Q0600  . furosemide  20 mg Intravenous Once  . hydrocortisone  10 mg Oral QPM  . hydrocortisone  20 mg Oral q morning - 10a  . insulin aspart  0-9 Units Subcutaneous TID WC  . linezolid (ZYVOX) IV  600 mg Intravenous Q12H  . midodrine  20 mg Oral TID WC  . mupirocin ointment  1 application Nasal BID  . oxybutynin  5 mg Per Tube BID  . pantoprazole  40 mg Oral Daily  . tigecycline (TYGACIL) IVPB  100 mg Intravenous Once  Followed by  . tigecycline (TYGACIL) IVPB  50 mg Intravenous Q12H   Continuous Infusions: . dextrose 5 % and 0.9% NaCl 100 mL/hr at 08/28/16 1700    LOS: 3 days    Jason Eckstein, MD, FACP, FHM. [facility] Pager [phone]  If 7PM-7AM, please contact night-coverage [email or web address] Password [email or web address] 08/31/2016, 1:20 PM

## 2016-08-29 NOTE — H&P (Signed)
Jason [name] is an 30 y.o. male.   Chief Complaint: sacral / ischial ulcers HPI: The patient is a 30 yrs old bm known to our service.  He has sacral / ischial ulcers.  He has been debrided in the past.  He has poor nutrition and is not able to off load well.  Readmitted due to fever and change in mental status.  Concern present for osteo of sacral / ischial area.  Will attempt a biopsy.  Past Medical History:  Diagnosis Date  . Anemia   . Anxiety   . Arthritis   . Asthma   . 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 Colostomy in place [facility])   . Complication of anesthesia    difficulty waking up, will have sudden drop in BP and O2 sats  . Depression   . Diabetic neuropathy ([place])   . Diabetic retinopathy ([place])   . Dysrhythmia    usually due to medications  . Family history of anesthesia complication    Pt mother can't have epidural procedures  . Fibromyalgia   . Gastroparesis   . GERD (gastroesophageal reflux disease)   . H/O constipation   . H/O respiratory failure   . H/O thrombosis   . Hx MRSA infection    on face  . Multiple allergies 11/02/2015  . Pneumonia   . Recurrent UTI 11/02/2015  . Seizures ([place]) 2017   one time during admission in hosptital  . 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 Syncope 02/16/2015  . 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 Type I diabetes mellitus ([place])    sees Dr. Loanne Drilling     Past Surgical History:  Procedure Laterality Date  . APPLICATION OF A-CELL OF BACK N/A 11/12/2014   Procedure: APPLICATION A CELL AND VAC ;  Surgeon: Theodoro Kos, DO;  Location: [place];  Service: Plastics;  Laterality: N/A;  . APPLICATION OF A-CELL OF BACK N/A 12/08/2014   Procedure: APPLICATION OF A-CELL AND WOUND VAC ;  Surgeon: Theodoro Kos, DO;  Location: [place];  Service: Plastics;  Laterality: N/A;  . APPLICATION OF A-CELL OF CHEST/ABDOMEN N/A 08/04/2015   Procedure: APPLICATION OF A-CELL ;  Surgeon: Loel Lofty  Rogue Pautler, DO;  Location: [place];  Service: Plastics;  Laterality: N/A;  . COLOSTOMY    . DEBRIDMENT OF DECUBITUS ULCER N/A 10/04/2014   Procedure: DEBRIDMENT OF DECUBITUS ULCER;  Surgeon: Georganna Skeans, MD;  Location: [place];  Service: General;  Laterality: N/A;  . GASTROSTOMY TUBE PLACEMENT    . HEMATOMA EVACUATION N/A 05/05/2015   Procedure: EVACUATION HEMATOMA bedside procedure;  Surgeon: Theodoro Kos, DO;  Location: [place];  Service: Plastics;  Laterality: N/A;  . INCISION AND DRAINAGE OF WOUND N/A 11/12/2014   Procedure: IRRIGATION AND DEBRIDEMENT OF WOUNDS WITH BONE BIOPSY AND SURGICAL PREP ;  Surgeon: Theodoro Kos, DO;  Location: [place];  Service: Plastics;  Laterality: N/A;  . INCISION AND DRAINAGE OF WOUND N/A 11/18/2014   Procedure: IRRIGATION AND DEBRIDEMENT OF SACRAL ULCER ONLY WITH PLACEMENT OF A CELL AND VAC/ DRESSING CHANGE TO UPPER BACK AREA.;  Surgeon: Theodoro Kos, DO;  Location: [place];  Service: Plastics;  Laterality: N/A;  . INCISION AND DRAINAGE OF WOUND N/A 11/25/2014   Procedure: IRRIGATION AND DEBRIDEMENT OF SACRAL ULCER AND BACK BURN WITH PLACEMENT OF A-CELL;  Surgeon: Theodoro Kos, DO;  Location: [place];  Service: Plastics;  Laterality: N/A;  . INCISION AND DRAINAGE  OF WOUND Right 12/08/2014   Procedure: IRRIGATION AND DEBRIDEMENT SACRAL WOUND AND RIGHT ISCHIAL WOUND ;  Surgeon: Theodoro Kos, DO;  Location: [place];  Service: Plastics;  Laterality: Right;  . INCISION AND DRAINAGE OF WOUND Bilateral 05/05/2015   Procedure: IRRIGATION AND DEBRIDEMENT SACRAL ULCER;  Surgeon: Theodoro Kos, DO;  Location: [place];  Service: Plastics;  Laterality: Bilateral;  . INCISION AND DRAINAGE OF WOUND N/A 08/04/2015   Procedure: IRRIGATION AND DEBRIDEMENT OF SACRAL ULCER AND ;  Surgeon: Loel Lofty Abdo Denault, DO;  Location: [place];  Service: Plastics;  Laterality: N/A;  . INSERTION OF SUPRAPUBIC CATHETER N/A 10/12/2014   Procedure: INSERTION OF SUPRAPUBIC CATHETER;  Surgeon: Reece Packer, MD;   Location: [place];  Service: Urology;  Laterality: N/A;  . IR GENERIC HISTORICAL  07/11/2016   IR CM INJ ANY COLONIC TUBE W/FLUORO 07/11/2016 Arne Cleveland, MD [facility]-INTERV RAD  . LAPAROSCOPIC DIVERTED COLOSTOMY N/A 10/12/2014   Procedure: LAPAROSCOPIC DIVERTING COLOSTOMY;  Surgeon: Donnie Mesa, MD;  Location: [place];  Service: General;  Laterality: N/A;  . MINOR APPLICATION OF WOUND VAC N/A 11/25/2014   Procedure:  WOUND VAC CHANGE;  Surgeon: Theodoro Kos, DO;  Location: [place];  Service: Plastics;  Laterality: N/A;  . MULTIPLE EXTRACTIONS WITH ALVEOLOPLASTY N/A 08/03/2014   Procedure: MULTIPLE EXTRACTIONS;  Surgeon: Gae Bon, DDS;  Location: [place];  Service: Oral Surgery;  Laterality: N/A;  . RADIOLOGY WITH ANESTHESIA N/A 07/05/2016   Procedure: RADIOLOGY WITH ANESTHESIA MRI OF PELVIS WITH AND WITHOUT;  Surgeon: Medication Radiologist, MD;  Location: [place];  Service: Radiology;  Laterality: N/A;  . TEE WITHOUT CARDIOVERSION N/A 08/17/2014   Procedure: TRANSESOPHAGEAL ECHOCARDIOGRAM (TEE);  Surgeon: Dorothy Spark, MD;  Location: [place];  Service: Cardiovascular;  Laterality: N/A;  . TEE WITHOUT CARDIOVERSION N/A 05/05/2015   Procedure: TRANSESOPHAGEAL ECHOCARDIOGRAM (TEE);  Surgeon: Lelon Perla, MD;  Location[facility] END[facility];  Service: Cardiovascular;  Laterality: N/A;  . TONSILLECTOMY      Family History  Problem Relation Age of Onset  . Diabetes Father   . Hypertension Father   . Heart disease Mother     congenital - MVP  . Asthma      fhx  . Hypertension      fhx  . Stroke      fhx   Social History:  reports that he quit smoking about 1 years ago. His smoking use included Cigars and Cigarettes. He has a 12.00 pack-year smoking history. He has never used smokeless tobacco. He reports that he does not drink alcohol or use drugs.  Allergies:  Allergies  Allergen Reactions  . Amikacin Sulfate Other (See Comments)    Seizure   . Cefuroxime Axetil Anaphylaxis     Tolerated cefepime 12/2015 - with Pepcid/Solu-Medrol  . Ertapenem Other (See Comments)    Rash and confusion-->tolerated Imipenem   . Morphine And Related Other (See Comments)    Changed mental status, confusion, headache, visual hallucination  . Nsaids Itching and Other (See Comments)    Risk of bleeding, itching  . Penicillins Anaphylaxis and Other (See Comments)    Tolerated Imipenem; no reaction to 7 day course of amoxicillin in 2015 Has patient had a PCN reaction causing immediate rash, facial/tongue/throat swelling, SOB or lightheadedness with hypotension: Yes Has patient had a PCN reaction causing severe rash involving mucus membranes or skin necrosis: No Has patient had a PCN reaction that required hospitalization Yes Has patient had a PCN reaction occurring within  the last 10 years: No If all of the above answers are "NO", then may proceed with Cephalo  . Sulfa Antibiotics Anaphylaxis, Shortness Of Breath and Other (See Comments)  . Tessalon [Benzonatate] Anaphylaxis  . Aztreonam Swelling    He appears to tolerate if you premedicate with steroids. Had facial/neck swelling without premedication  . Levaquin [Levofloxacin In D5w] Swelling    Hand and forearm swelling  . Rocephin [Ceftriaxone] Other (See Comments)    Confusion  . Shellfish Allergy Itching and Other (See Comments)    Took benadryl to alleviate reaction  . Miripirium Rash and Other (See Comments)    Change in mental status    Medications Prior to Admission  Medication Sig Dispense Refill  . acetaminophen (TYLENOL) 325 MG tablet Take 2 tablets (650 mg total) by mouth every 6 (six) hours as needed for mild pain, moderate pain, fever or headache.    . baclofen (LIORESAL) 20 MG tablet Take 1 tablet (20 mg total) by mouth 4 (four) times daily. 120 tablet 5  . BISACODYL 5 MG EC tablet Take ONE (1) tablet by mouth twice daily as needed FOR MODERATE CONSTIPATION (Patient taking differently: Take 5 MG by mouth twice daily as  needed FOR MODERATE CONSTIPATION) 60 tablet 5  . buPROPion (WELLBUTRIN XL) 150 MG 24 hr tablet Take 1 tablet (150 mg total) by mouth daily. 30 tablet 11  . camphor-menthol (SARNA) lotion Apply topically as needed for itching. 222 mL 0  . collagenase (SANTYL) ointment Apply 1 application topically 3 (three) times a week. (Patient taking differently: Apply 1 application topically as needed (dressing change). ) 90 g 5  . dexlansoprazole (DEXILANT) 60 MG capsule Take 1 capsule (60 mg total) by mouth daily. 30 capsule 11  . docusate (COLACE) 50 MG/5ML liquid Take 10 mLs (100 mg total) by mouth 2 (two) times daily. (Patient taking differently: Take 100 mg by mouth daily. ) 473 mL 11  . fentaNYL (DURAGESIC - DOSED MCG/HR) 75 MCG/HR place 1 patch (34mg total) onto the skin every 3 (three) days 10 patch 0  . ferrous sulfate 325 (65 FE) MG tablet Take 1 tablet (325 mg total) by mouth daily with breakfast. 30 tablet 11  . glucagon (GLUCAGON EMERGENCY) 1 MG injection INJECT INTO MUSCLE ONCE AS NEEDED (Patient taking differently: Inject 1 mg into the muscle once as needed (hypoglycemia). INJECT INTO MUSCLE ONCE AS NEEDED) 1 kit 0  . GLUCERNA (GLUCERNA) LIQD 1,000 mLs by PEG Tube route continuous. 70 ml/hr    . hydrocortisone (CORTEF) 10 MG tablet Take 1 tablet (10 mg total) by mouth every evening. Hold for the next 5 days, while using solumedrol. Then resume as previously instructed    . HYDROmorphone (DILAUDID) 8 MG tablet Take ONE (1) tablet ('8mg'$  total) by mouth every 6 hours as needed for severe pain 120 tablet 0  . insulin aspart (NOVOLOG FLEXPEN) 100 UNIT/ML FlexPen Inject 0-10 Units into the skin 4 (four) times daily as needed for high blood sugar (CBG >200). Per sliding scale: CBG <200 = 0 units, 201-300 = 4 units, 301-400 = 6 units, >400 = 10 units    . insulin glargine (LANTUS) 100 UNIT/ML injection Inject 0.22 mLs (22 Units total) into the skin at bedtime. (Patient taking differently: Inject 22 Units into  the skin every morning. )    . loratadine (CLARITIN) 10 MG tablet Take 1 tablet (10 mg total) by mouth daily. 30 tablet 11  . LYRICA 100 MG capsule Take  ONE (1)  capsule by mouth three times daily (morning,afternoon and evening) and 2 capsules at bedtime (Patient taking differently: Take 100 mg by mouth three times a day (morning/afternoon/evening) and 200 mg at bedtime) 150 capsule 5  . Magnesium Oxide 400 (240 Mg) MG TABS Take ONE (1) tablet by mouth twice daily (Patient taking differently: Take 400 MG by mouth twice daily) 60 tablet 11  . midodrine (PROAMATINE) 10 MG tablet Take 2 tablets (20 mg total) by mouth 3 (three) times daily with meals. (Patient taking differently: Take 40 mg by mouth daily as needed. ) 180 tablet 6  . Multiple Vitamin (MULTIVITAMIN WITH MINERALS) TABS tablet Place 1 tablet into feeding tube daily.    [place] Bicarbonate (ZEGERID OTC PO) Take 1 tablet by mouth as needed (for acid reflux or heartburn).    . ondansetron (ZOFRAN ODT) 8 MG disintegrating tablet Place 1 tablet (8 mg total) into feeding tube every 8 (eight) hours as needed for nausea or vomiting. 60 tablet 5  . oxybutynin (DITROPAN) 5 MG tablet Place 1 tablet (5 mg total) into feeding tube 2 (two) times daily. 60 tablet 2  . Oxycodone HCl 20 MG TABS Take 1 tablet (20 mg total) by mouth every 6 (six) hours as needed (pain). 120 tablet 0  . VOLTAREN 1 % GEL apply 2 grams topically twice a day (Patient taking differently: Apply 2 grams topically twice a day as needed for shoulder pain) 100 g 5  . Water For Irrigation, Sterile (FREE WATER) SOLN Place 200 mLs into feeding tube every 8 (eight) hours. (Patient taking differently: Place 300-400 mLs into feeding tube every 8 (eight) hours. ) 10000 mL 5  . famotidine (PEPCID) 10 MG tablet Take 1 tablet (10 mg total) by mouth every 8 (eight) hours. (Patient not taking: Reported on 08/18/2016) 30 tablet 0  . famotidine (PEPCID) 20 MG tablet Take 1 tablet (20 mg  total) by mouth 2 (two) times daily. Take around antibiotics infusion time (Patient not taking: Reported on 08/18/2016) 60 tablet 1  . hydrocortisone (CORTEF) 20 MG tablet Take 1 tablet (20 mg total) by mouth every morning. Hold for the next 5 days, while on solumedrol. Resume after this time as previously    . LORazepam (ATIVAN) 0.5 MG tablet Take 1 tablet (0.5 mg total) by mouth 3 (three) times daily as needed for anxiety. Reported on 11/02/2015 (Patient taking differently: Take 0.25-0.5 mg by mouth 3 (three) times daily as needed for anxiety. Reported on 11/02/2015) 90 tablet 5  . mupirocin ointment (BACTROBAN) 2 % Apply to affected area twice a day as needed 22 g 11    Results for orders placed or performed during the hospital encounter of 08/14/2016 (from the past 48 hour(s))  Glucose, capillary     Status: Abnormal   Collection Time: 08/27/16  9:09 AM  Result Value Ref Range   Glucose-Capillary 26 (LL) 65 - 99 mg/dL    Comment: REPEATED TO VERIFY, CHARGE CREDITED   Comment 1 Notify RN    Comment 2 Document in Chart   Glucose, capillary     Status: Abnormal   Collection Time: 08/27/16  9:12 AM  Result Value Ref Range   Glucose-Capillary 26 (LL) 65 - 99 mg/dL  Glucose, capillary     Status: Abnormal   Collection Time: 08/27/16  9:42 AM  Result Value Ref Range   Glucose-Capillary 120 (H) 65 - 99 mg/dL  CBC with Differential/Platelet     Status: Abnormal  Collection Time: 08/27/16  9:50 AM  Result Value Ref Range   WBC 29.1 (H) 4.0 - 10.5 K/uL   RBC 2.65 (L) 4.22 - 5.81 MIL/uL   Hemoglobin 7.0 (L) 13.0 - 17.0 g/dL   HCT 22.0 (L) 39.0 - 52.0 %   MCV 83.0 78.0 - 100.0 fL   MCH 26.4 26.0 - 34.0 pg   MCHC 31.8 30.0 - 36.0 g/dL   RDW 17.5 (H) 11.5 - 15.5 %   Platelets 372 150 - 400 K/uL   Neutrophils Relative % 91 %   Lymphocytes Relative 3 %   Monocytes Relative 6 %   Eosinophils Relative 0 %   Basophils Relative 0 %   Neutro Abs 26.5 (H) 1.7 - 7.7 K/uL   Lymphs Abs 0.9 0.7 - 4.0  K/uL   Monocytes Absolute 1.7 (H) 0.1 - 1.0 K/uL   Eosinophils Absolute 0.0 0.0 - 0.7 K/uL   Basophils Absolute 0.0 0.0 - 0.1 K/uL   WBC Morphology INCREASED BANDS (>20% BANDS)     Comment: TOXIC GRANULATION  Comprehensive metabolic panel     Status: Abnormal   Collection Time: 08/27/16  9:50 AM  Result Value Ref Range   Sodium 127 (L) 135 - 145 mmol/L   Potassium 3.7 3.5 - 5.1 mmol/L   Chloride 104 101 - 111 mmol/L   CO2 20 (L) 22 - 32 mmol/L   Glucose, Bld 22 (LL) 65 - 99 mg/dL    Comment: REPEATED TO VERIFY CRITICAL RESULT CALLED TO, READ BACK BY AND VERIFIED WITH: B.GROGAN,RN 08/27/16 1053 BY BSLADE    BUN 40 (H) 6 - 20 mg/dL   Creatinine, Ser 0.70 0.61 - 1.24 mg/dL   Calcium 8.4 (L) 8.9 - 10.3 mg/dL   Total Protein 4.6 (L) 6.5 - 8.1 g/dL   Albumin 1.4 (L) 3.5 - 5.0 g/dL   AST 15 15 - 41 U/L   ALT 11 (L) 17 - 63 U/L   Alkaline Phosphatase 100 38 - 126 U/L   Total Bilirubin 0.2 (L) 0.3 - 1.2 mg/dL   GFR calc non Af Amer >60 >60 mL/min   GFR calc Af Amer >60 >60 mL/min    Comment: (NOTE) The eGFR has been calculated using the CKD EPI equation. This calculation has not been validated in all clinical situations. eGFR's persistently <60 mL/min signify possible Chronic Kidney Disease.    Anion gap 3 (L) 5 - 15  Magnesium     Status: None   Collection Time: 08/27/16  9:50 AM  Result Value Ref Range   Magnesium 1.9 1.7 - 2.4 mg/dL  Phosphorus     Status: Abnormal   Collection Time: 08/27/16  9:50 AM  Result Value Ref Range   Phosphorus <1.0 (LL) 2.5 - 4.6 mg/dL    Comment: REPEATED TO VERIFY CRITICAL RESULT CALLED TO, READ BACK BY AND VERIFIED WITH: B.GROGAN,RN 08/27/16 1053 BY BSLADE   Prepare RBC     Status: None   Collection Time: 08/27/16 11:03 AM  Result Value Ref Range   Order Confirmation ORDER PROCESSED BY BLOOD BANK   Glucose, capillary     Status: None   Collection Time: 08/27/16 11:26 AM  Result Value Ref Range   Glucose-Capillary 94 65 - 99 mg/dL    Comment 1 Notify RN    Comment 2 Document in Chart   Glucose, capillary     Status: Abnormal   Collection Time: 08/27/16  2:50 PM  Result Value Ref Range   Glucose-Capillary  115 (H) 65 - 99 mg/dL   Comment 1 Notify RN    Comment 2 Document in Chart   Glucose, capillary     Status: Abnormal   Collection Time: 08/27/16  3:52 PM  Result Value Ref Range   Glucose-Capillary 126 (H) 65 - 99 mg/dL  Hemoglobin and hematocrit, blood     Status: Abnormal   Collection Time: 08/27/16  8:30 PM  Result Value Ref Range   Hemoglobin 10.9 (L) 13.0 - 17.0 g/dL    Comment: REPEATED TO VERIFY POST TRANSFUSION SPECIMEN    HCT 32.4 (L) 39.0 - 52.0 %  Glucose, capillary     Status: Abnormal   Collection Time: 08/27/16  9:07 PM  Result Value Ref Range   Glucose-Capillary 205 (H) 65 - 99 mg/dL  Procalcitonin     Status: None   Collection Time: 08/28/16  4:15 AM  Result Value Ref Range   Procalcitonin 1.25 ng/mL    Comment:        Interpretation: PCT > 0.5 ng/mL and <= 2 ng/mL: Systemic infection (sepsis) is possible, but other conditions are known to elevate PCT as well. (NOTE)         ICU PCT Algorithm               Non ICU PCT Algorithm    ----------------------------     ------------------------------         PCT < 0.25 ng/mL                 PCT < 0.1 ng/mL     Stopping of antibiotics            Stopping of antibiotics       strongly encouraged.               strongly encouraged.    ----------------------------     ------------------------------       PCT level decrease by               PCT < 0.25 ng/mL       >= 80% from peak PCT       OR PCT 0.25 - 0.5 ng/mL          Stopping of antibiotics                                             encouraged.     Stopping of antibiotics           encouraged.    ----------------------------     ------------------------------       PCT level decrease by              PCT >= 0.25 ng/mL       < 80% from peak PCT        AND PCT >= 0.5 ng/mL              Continuing antibiotics                                              encouraged.       Continuing antibiotics            encouraged.    ----------------------------     ------------------------------  PCT level increase compared          PCT > 0.5 ng/mL         with peak PCT AND          PCT >= 0.5 ng/mL             Escalation of antibiotics                                          strongly encouraged.      Escalation of antibiotics        strongly encouraged.   CBC with Differential/Platelet     Status: Abnormal   Collection Time: 08/28/16  4:15 AM  Result Value Ref Range   WBC 36.5 (H) 4.0 - 10.5 K/uL   RBC 3.95 (L) 4.22 - 5.81 MIL/uL   Hemoglobin 10.7 (L) 13.0 - 17.0 g/dL   HCT 32.1 (L) 39.0 - 52.0 %   MCV 81.3 78.0 - 100.0 fL   MCH 27.1 26.0 - 34.0 pg   MCHC 33.3 30.0 - 36.0 g/dL   RDW 16.6 (H) 11.5 - 15.5 %   Platelets 412 (H) 150 - 400 K/uL   Neutrophils Relative % 87 %   Lymphocytes Relative 4 %   Monocytes Relative 9 %   Eosinophils Relative 0 %   Basophils Relative 0 %   Neutro Abs 31.7 (H) 1.7 - 7.7 K/uL   Lymphs Abs 1.5 0.7 - 4.0 K/uL   Monocytes Absolute 3.3 (H) 0.1 - 1.0 K/uL   Eosinophils Absolute 0.0 0.0 - 0.7 K/uL   Basophils Absolute 0.0 0.0 - 0.1 K/uL   WBC Morphology INCREASED BANDS (>20% BANDS)     Comment: TOXIC GRANULATION  Comprehensive metabolic panel     Status: Abnormal   Collection Time: 08/28/16  4:15 AM  Result Value Ref Range   Sodium 126 (L) 135 - 145 mmol/L   Potassium 4.6 3.5 - 5.1 mmol/L    Comment: DELTA CHECK NOTED   Chloride 103 101 - 111 mmol/L   CO2 18 (L) 22 - 32 mmol/L   Glucose, Bld 263 (H) 65 - 99 mg/dL   BUN 42 (H) 6 - 20 mg/dL   Creatinine, Ser 1.00 0.61 - 1.24 mg/dL   Calcium 8.2 (L) 8.9 - 10.3 mg/dL   Total Protein 5.4 (L) 6.5 - 8.1 g/dL   Albumin 1.6 (L) 3.5 - 5.0 g/dL   AST 14 (L) 15 - 41 U/L   ALT 12 (L) 17 - 63 U/L   Alkaline Phosphatase 117 38 - 126 U/L   Total Bilirubin 0.4 0.3 - 1.2 mg/dL   GFR calc non Af  Amer >60 >60 mL/min   GFR calc Af Amer >60 >60 mL/min    Comment: (NOTE) The eGFR has been calculated using the CKD EPI equation. This calculation has not been validated in all clinical situations. eGFR's persistently <60 mL/min signify possible Chronic Kidney Disease.    Anion gap 5 5 - 15  Magnesium     Status: None   Collection Time: 08/28/16  4:15 AM  Result Value Ref Range   Magnesium 1.9 1.7 - 2.4 mg/dL  Phosphorus     Status: None   Collection Time: 08/28/16  4:15 AM  Result Value Ref Range   Phosphorus 3.3 2.5 - 4.6 mg/dL  Glucose, capillary     Status: Abnormal  Collection Time: 08/28/16  8:22 AM  Result Value Ref Range   Glucose-Capillary 263 (H) 65 - 99 mg/dL   Comment 1 Notify RN    Comment 2 Document in Chart   Blood gas, arterial     Status: Abnormal   Collection Time: 08/28/16 10:30 AM  Result Value Ref Range   FIO2 0.28    Delivery systems TRACH COLLAR/TRACH TUBE    pH, Arterial 7.251 (L) 7.350 - 7.450   pCO2 arterial 36.6 32.0 - 48.0 mmHg   pO2, Arterial 105 83.0 - 108.0 mmHg   Bicarbonate 15.5 (L) 20.0 - 28.0 mmol/L   Acid-base deficit 10.3 (H) 0.0 - 2.0 mmol/L   O2 Saturation 97.7 %   Patient temperature 98.6    Collection site LEFT RADIAL    Drawn by [phone]    Sample type ARTERIAL DRAW    Allens test (pass/fail) PASS PASS  Glucose, capillary     Status: Abnormal   Collection Time: 08/28/16 12:13 PM  Result Value Ref Range   Glucose-Capillary 236 (H) 65 - 99 mg/dL  Glucose, capillary     Status: Abnormal   Collection Time: 08/28/16  5:13 PM  Result Value Ref Range   Glucose-Capillary 179 (H) 65 - 99 mg/dL   Comment 1 Notify RN    Comment 2 Document in Chart   Glucose, capillary     Status: Abnormal   Collection Time: 08/28/16  9:13 PM  Result Value Ref Range   Glucose-Capillary 168 (H) 65 - 99 mg/dL  Glucose, capillary     Status: Abnormal   Collection Time: 08/17/2016  3:51 AM  Result Value Ref Range   Glucose-Capillary 209 (H) 65 - 99 mg/dL   CBC with Differential/Platelet     Status: Abnormal (Preliminary result)   Collection Time: 08/26/2016  6:15 AM  Result Value Ref Range   WBC 34.1 (H) 4.0 - 10.5 K/uL    Comment: REPEATED TO VERIFY   RBC 4.06 (L) 4.22 - 5.81 MIL/uL   Hemoglobin 11.0 (L) 13.0 - 17.0 g/dL   HCT 33.2 (L) 39.0 - 52.0 %   MCV 81.8 78.0 - 100.0 fL   MCH 27.1 26.0 - 34.0 pg   MCHC 33.1 30.0 - 36.0 g/dL   RDW 17.2 (H) 11.5 - 15.5 %   Platelets 351 150 - 400 K/uL   Neutrophils Relative % PENDING %   Neutro Abs PENDING 1.7 - 7.7 K/uL   Band Neutrophils PENDING %   Lymphocytes Relative PENDING %   Lymphs Abs PENDING 0.7 - 4.0 K/uL   Monocytes Relative PENDING %   Monocytes Absolute PENDING 0.1 - 1.0 K/uL   Eosinophils Relative PENDING %   Eosinophils Absolute PENDING 0.0 - 0.7 K/uL   Basophils Relative PENDING %   Basophils Absolute PENDING 0.0 - 0.1 K/uL   WBC Morphology PENDING    RBC Morphology PENDING    Smear Review PENDING    nRBC PENDING 0 /100 WBC   Metamyelocytes Relative PENDING %   Myelocytes PENDING %   Promyelocytes Absolute PENDING %   Blasts PENDING %  Comprehensive metabolic panel     Status: Abnormal   Collection Time: 08/14/2016  6:15 AM  Result Value Ref Range   Sodium 129 (L) 135 - 145 mmol/L   Potassium 4.8 3.5 - 5.1 mmol/L   Chloride 107 101 - 111 mmol/L   CO2 16 (L) 22 - 32 mmol/L   Glucose, Bld 244 (H) 65 - 99 mg/dL  BUN 42 (H) 6 - 20 mg/dL   Creatinine, Ser 1.23 0.61 - 1.24 mg/dL   Calcium 8.0 (L) 8.9 - 10.3 mg/dL   Total Protein 5.4 (L) 6.5 - 8.1 g/dL   Albumin 1.5 (L) 3.5 - 5.0 g/dL   AST 11 (L) 15 - 41 U/L   ALT 12 (L) 17 - 63 U/L   Alkaline Phosphatase 116 38 - 126 U/L   Total Bilirubin 0.3 0.3 - 1.2 mg/dL   GFR calc non Af Amer >60 >60 mL/min   GFR calc Af Amer >60 >60 mL/min    Comment: (NOTE) The eGFR has been calculated using the CKD EPI equation. This calculation has not been validated in all clinical situations. eGFR's persistently <60 mL/min signify  possible Chronic Kidney Disease.    Anion gap 6 5 - 15  Magnesium     Status: None   Collection Time: 09/06/2016  6:15 AM  Result Value Ref Range   Magnesium 1.8 1.7 - 2.4 mg/dL  Phosphorus     Status: None   Collection Time: 08/26/2016  6:15 AM  Result Value Ref Range   Phosphorus 4.0 2.5 - 4.6 mg/dL   *Note: Due to a large number of results and/or encounters for the requested time period, some results have not been displayed. A complete set of results can be found in Results Review.   Ct Head Wo Contrast  Result Date: 08/28/2016 CLINICAL DATA:  Decreased in responsiveness EXAM: CT HEAD WITHOUT CONTRAST TECHNIQUE: Contiguous axial images were obtained from the base of the skull through the vertex without intravenous contrast. COMPARISON:  CT brain scan of 08/13/2016 FINDINGS: Brain: The ventricular system is unchanged and there are prominent cortical sulci consistent with atrophy. The septum remains midline in position. The fourth ventricle and basilar cisterns are unremarkable. No hemorrhage, mass lesion, or acute infarction is seen. Vascular: No vascular abnormality is seen on this unenhanced study. Skull: No calvarial abnormality is noted. Sinuses/Orbits: The paranasal sinuses are pneumatized with only mild mucosal thickening within the sphenoid sinus. Other: None IMPRESSION: 1. Mild atrophy.  No acute intracranial abnormality. 2. Mucosal thickening within the sphenoid sinus. No air-fluid level is seen. Electronically Signed   By: [name]   On: 08/28/2016 12:10   Dg Chest Port 1 View  Result Date: 08/28/2016 CLINICAL DATA:  Tracheostomy patient, followup EXAM: PORTABLE CHEST 1 VIEW COMPARISON:  Portable chest x-ray of 09/04/2016 FINDINGS: The tracheostomy remains in good position well above the carina. Bibasilar volume loss is unchanged. Heart size is stable. Right PICC line tip overlies the lower SVC. IMPRESSION: 1. No significant change in bibasilar atelectasis. 2. Tracheostomy and  right PICC line unchanged in position. Electronically Signed   By: [name]   On: 08/28/2016 08:02    Review of Systems  Unable to perform ROS: Critical illness  Constitutional: Positive for chills, fever and weight loss.  HENT: Negative.   Eyes: Negative.   Respiratory: Negative.   Cardiovascular: Negative.   Genitourinary: Negative.   Musculoskeletal: Negative.   Neurological: Positive for weakness.    Blood pressure 109/76, pulse 81, temperature 97.7 F (36.5 C), temperature source Axillary, resp. rate 10, height '5\' 8"'$  (1.727 m), weight 54.9 kg (121 lb), SpO2 100 %. Physical Exam  HENT:  Head: Normocephalic and atraumatic.  Cardiovascular: Normal rate.   Respiratory: Effort normal.  GI: Soft.  Feeding tube in place  Musculoskeletal: He exhibits no deformity.  Neurological:  confused  Skin:  Sacral  wound  Psychiatric:  confused     Assessment/Plan Plan for biopsy and possible debridement of sacral / ischial ulcers.  [name] Going, DO 09/02/2016, 8:27 AM

## 2016-08-29 NOTE — Progress Notes (Signed)
Late Entry  During initial assessment this AM, pt unable to follow commands, non communicative, and not responsive to any stimuli, including pain. This has been his baseline periodically throughout this admission so far. Vitals are stable. Dr. Algis Liming on unit and notified of these findings. New orders received. Fentanyl patch removed from LUE and first dose of narcan administered at 0939 with minimal response - pt moved lips and lifted right arm towards his mother. Dr. Baxter Flattery at patient's bedside shortly afterwards and requested a second dose of narcan which was administered at 0953, with no change in response from previous. Azucena Freed, PA also at bedside and updated. ABG's obtained by RRT. PCXR also obtained. Mother at bedside and updated. Will monitor.  Joellen Jersey, RN.

## 2016-08-29 NOTE — Progress Notes (Signed)
Pt is unavailable for EEG; going to surgery.

## 2016-08-29 NOTE — Anesthesia Postprocedure Evaluation (Signed)
Anesthesia Post Note  Patient: Jason [name]  Procedure(s) Performed: Procedure(s) (LRB): IRRIGATION AND DEBRIDEMENT SACRAL ULCER (N/A)  Patient location during evaluation: PACU Anesthesia Type: General Level of consciousness: comatose Pain management: pain level controlled Vital Signs Assessment: post-procedure vital signs reviewed and stable Respiratory status: spontaneous breathing Cardiovascular status: stable Anesthetic complications: no    Last Vitals:  Vitals:   08/20/2016 1630 09/03/2016 1636  BP: 112/89 119/85  Pulse: 89 88  Resp: 14 10  Temp:      Last Pain:  Vitals:   08/12/2016 1636  TempSrc:   PainSc: [place]

## 2016-08-29 NOTE — Consult Note (Addendum)
WOC follow-up: Dr Marla Roe of the plastics team now following for assessment and plan of care to sacral and ischium wounds and plans for surgery today, according to the EMR.  Please refer to their team for further plan of care. Please re-consult if further assistance is needed.  Thank-you,  Julien Girt MSN, [place], [place], [place], [place]

## 2016-08-29 NOTE — Progress Notes (Signed)
RT Note: Patient was off unit earlier this afternoon so RT was unable to assess him at 1600. Once patient arrived back to his room, RT came and assessed him. He remains stable from a respiratory standpoint and he is continuing to maintain his respiratory status on the aerosol trach collar. RT will continue to monitor.

## 2016-08-30 ENCOUNTER — Inpatient Hospital Stay (HOSPITAL_COMMUNITY): Admit: 2016-08-30 | Payer: Medicaid Other

## 2016-08-30 ENCOUNTER — Inpatient Hospital Stay (HOSPITAL_COMMUNITY): Payer: Medicaid Other

## 2016-08-30 ENCOUNTER — Encounter (HOSPITAL_COMMUNITY): Payer: Self-pay | Admitting: Plastic Surgery

## 2016-08-30 DIAGNOSIS — T380X5A Adverse effect of glucocorticoids and synthetic analogues, initial encounter: Secondary | ICD-10-CM

## 2016-08-30 DIAGNOSIS — R4020 Unspecified coma: Secondary | ICD-10-CM

## 2016-08-30 DIAGNOSIS — M4850XG Collapsed vertebra, not elsewhere classified, site unspecified, subsequent encounter for fracture with delayed healing: Secondary | ICD-10-CM

## 2016-08-30 DIAGNOSIS — A491 Streptococcal infection, unspecified site: Secondary | ICD-10-CM

## 2016-08-30 DIAGNOSIS — R7881 Bacteremia: Secondary | ICD-10-CM

## 2016-08-30 DIAGNOSIS — Z933 Colostomy status: Secondary | ICD-10-CM

## 2016-08-30 DIAGNOSIS — E273 Drug-induced adrenocortical insufficiency: Secondary | ICD-10-CM

## 2016-08-30 DIAGNOSIS — R600 Localized edema: Secondary | ICD-10-CM

## 2016-08-30 DIAGNOSIS — L8990 Pressure ulcer of unspecified site, unspecified stage: Secondary | ICD-10-CM

## 2016-08-30 LAB — COMPREHENSIVE METABOLIC PANEL
ALT: 12 U/L — AB (ref 17–63)
AST: 15 U/L (ref 15–41)
Albumin: 1.5 g/dL — ABNORMAL LOW (ref 3.5–5.0)
Alkaline Phosphatase: 124 U/L (ref 38–126)
Anion gap: 5 (ref 5–15)
BUN: 47 mg/dL — AB (ref 6–20)
CHLORIDE: 112 mmol/L — AB (ref 101–111)
CO2: 16 mmol/L — ABNORMAL LOW (ref 22–32)
CREATININE: 1.33 mg/dL — AB (ref 0.61–1.24)
Calcium: 8.1 mg/dL — ABNORMAL LOW (ref 8.9–10.3)
GFR calc Af Amer: >60 mL/min (ref >60)
Glucose, Bld: 128 mg/dL — ABNORMAL HIGH (ref 65–99)
Potassium: 4.4 mmol/L (ref 3.5–5.1)
Sodium: 133 mmol/L — ABNORMAL LOW (ref 135–145)
Total Bilirubin: 0.5 mg/dL (ref 0.3–1.2)
Total Protein: 5.1 g/dL — ABNORMAL LOW (ref 6.5–8.1)

## 2016-08-30 LAB — GLUCOSE, CAPILLARY
GLUCOSE-CAPILLARY: 128 mg/dL — AB (ref 65–99)
GLUCOSE-CAPILLARY: 184 mg/dL — AB (ref 65–99)
GLUCOSE-CAPILLARY: 197 mg/dL — AB (ref 65–99)
GLUCOSE-CAPILLARY: 170 mg/dL — AB (ref 65–99)
Glucose-Capillary: 119 mg/dL — ABNORMAL HIGH (ref 65–99)
Glucose-Capillary: 124 mg/dL — ABNORMAL HIGH (ref 65–99)

## 2016-08-30 LAB — POCT I-STAT 3, ART BLOOD GAS (G3+)
ACID-BASE DEFICIT: 17 mmol/L — AB (ref 0.0–2.0)
BICARBONATE: 10.4 mmol/L — AB (ref 20.0–28.0)
O2 SAT: 82 %
PO2 ART: 60 mmHg — AB (ref 83.0–108.0)
Patient temperature: 99
TCO2: 11 mmol/L (ref 0–100)
pCO2 arterial: 31 mmHg — ABNORMAL LOW (ref 32.0–48.0)
pH, Arterial: 7.136 — CL (ref 7.350–7.450)

## 2016-08-30 LAB — BLOOD GAS, ARTERIAL
ACID-BASE DEFICIT: 10.2 mmol/L — AB (ref 0.0–2.0)
ACID-BASE DEFICIT: 11.1 mmol/L — AB (ref 0.0–2.0)
Bicarbonate: 14.8 mmol/L — ABNORMAL LOW (ref 20.0–28.0)
Bicarbonate: 13.9 mmol/L — ABNORMAL LOW (ref 20.0–28.0)
DRAWN BY: 276051
DRAWN BY: 246101
FIO2: 28
FIO2: 100
MECHVT: 550 mL
O2 SAT: 79.9 %
O2 Saturation: 99.3 %
PATIENT TEMPERATURE: 99.3
PCO2 ART: 30.3 mmHg — AB (ref 32.0–48.0)
PEEP/CPAP: 5 cmH2O
PO2 ART: 209 mmHg — AB (ref 83.0–108.0)
Patient temperature: 98.6
RATE: 14 resp/min
pCO2 arterial: 28.1 mmHg — ABNORMAL LOW (ref 32.0–48.0)
pH, Arterial: 7.311 — ABNORMAL LOW (ref 7.350–7.450)
pH, Arterial: 7.315 — ABNORMAL LOW (ref 7.350–7.450)
pO2, Arterial: 43.9 mmHg — ABNORMAL LOW (ref 83.0–108.0)

## 2016-08-30 LAB — CBC
HEMATOCRIT: 33.2 % — AB (ref 39.0–52.0)
Hemoglobin: 10.9 g/dL — ABNORMAL LOW (ref 13.0–17.0)
MCH: 27.2 pg (ref 26.0–34.0)
MCHC: 32.8 g/dL (ref 30.0–36.0)
MCV: 82.8 fL (ref 78.0–100.0)
PLATELETS: 305 10*3/uL (ref 150–400)
RBC: 4.01 MIL/uL — AB (ref 4.22–5.81)
RDW: 17.8 % — ABNORMAL HIGH (ref 11.5–15.5)
WBC: 52.9 10*3/uL — AB (ref 4.0–10.5)

## 2016-08-30 LAB — PROCALCITONIN: Procalcitonin: 0.57 ng/mL

## 2016-08-30 MED ORDER — HYDROCORTISONE 20 MG PO TABS
20.0000 mg | ORAL_TABLET | Freq: Every morning | ORAL | Status: DC
  Administered 2016-08-30: 20 mg
  Filled 2016-08-30: qty 1

## 2016-08-30 MED ORDER — FUROSEMIDE 10 MG/ML IJ SOLN
INTRAMUSCULAR | Status: AC
  Administered 2016-08-30: 10 mg
  Filled 2016-08-30: qty 6

## 2016-08-30 MED ORDER — HYDROCORTISONE 10 MG PO TABS
10.0000 mg | ORAL_TABLET | Freq: Every evening | ORAL | Status: DC
  Administered 2016-08-30: 10 mg
  Filled 2016-08-30 (×2): qty 1

## 2016-08-30 MED ORDER — MIDODRINE HCL 5 MG PO TABS
20.0000 mg | ORAL_TABLET | Freq: Three times a day (TID) | ORAL | Status: DC
  Administered 2016-08-30 – 2016-10-05 (×101): 20 mg
  Filled 2016-08-30 (×106): qty 4

## 2016-08-30 MED ORDER — SCOPOLAMINE 1 MG/3DAYS TD PT72
1.0000 | MEDICATED_PATCH | TRANSDERMAL | Status: DC
  Administered 2016-08-30 – 2016-09-23 (×9): 1.5 mg via TRANSDERMAL
  Filled 2016-08-30 (×9): qty 1

## 2016-08-30 MED ORDER — FUROSEMIDE 10 MG/ML IJ SOLN
60.0000 mg | Freq: Once | INTRAMUSCULAR | Status: AC
  Administered 2016-08-30: 60 mg via INTRAVENOUS

## 2016-08-30 NOTE — Progress Notes (Signed)
Progress Note   Subjective  Patient taken to OR yesterday, had bone biopsy. Not arousable at present time, labored breathing, with desaturations - 88% while examining patient. No bleeding from ostomy   Objective   Vital signs in last 24 hours: Temp:  [97 F (36.1 C)-99.3 F (37.4 C)] 99.3 F (37.4 C) (11/23 0700) Pulse Rate:  [80-103] 85 (11/23 0700) Resp:  [6-26] 16 (11/23 0700) BP: (108-138)/(72-89) 138/86 (11/23 0700) SpO2:  [94 %-100 %] 94 % (11/23 0700) FiO2 (%):  [28 %] 28 % (11/23 0348) Last BM Date: 08/28/2016 General:    AA male with trach in place, labored breathing, non responsive Heart:  Regular rate and rhythm Lungs: Respirations even and but labored, some coarse BS anteriorly Abdomen:  Soft, ostomy in LLQ with no output. No blood noted Neurologic:  Quadriplegic, nonresponsive Psych:  Unable to assess  Intake/Output from previous day: 11/22 0701 - 11/23 0700 In: 2031 [I.V.:1170; NG/GT:60; IV Piggyback:801] Out: 600 [Urine:600] Intake/Output this shift: No intake/output data recorded.  Lab Results:  Recent Labs  08/28/16 0415 08/12/2016 0615 08/30/16 0500  WBC 36.5* 34.1* 52.9*  HGB 10.7* 11.0* 10.9*  HCT 32.1* 33.2* 33.2*  PLT 412* 351 305   BMET  Recent Labs  08/28/16 0415 08/11/2016 0615 08/30/16 0500  NA 126* 129* 133*  K 4.6 4.8 4.4  CL 103 107 112*  CO2 18* 16* 16*  GLUCOSE 263* 244* 128*  BUN 42* 42* 47*  CREATININE 1.00 1.23 1.33*  CALCIUM 8.2* 8.0* 8.1*   LFT  Recent Labs  08/30/16 0500  PROT 5.1*  ALBUMIN 1.5*  AST 15  ALT 12*  ALKPHOS 124  BILITOT 0.5   PT/INR No results for input(s): LABPROT, INR in the last 72 hours.  Studies/Results: Ct Head Wo Contrast  Result Date: 08/28/2016 CLINICAL DATA:  Decreased in responsiveness EXAM: CT HEAD WITHOUT CONTRAST TECHNIQUE: Contiguous axial images were obtained from the base of the skull through the vertex without intravenous contrast. COMPARISON:  CT brain scan of  08/13/2016 FINDINGS: Brain: The ventricular system is unchanged and there are prominent cortical sulci consistent with atrophy. The septum remains midline in position. The fourth ventricle and basilar cisterns are unremarkable. No hemorrhage, mass lesion, or acute infarction is seen. Vascular: No vascular abnormality is seen on this unenhanced study. Skull: No calvarial abnormality is noted. Sinuses/Orbits: The paranasal sinuses are pneumatized with only mild mucosal thickening within the sphenoid sinus. Other: None IMPRESSION: 1. Mild atrophy.  No acute intracranial abnormality. 2. Mucosal thickening within the sphenoid sinus. No air-fluid level is seen. Electronically Signed   By: [name]   On: 08/28/2016 12:10   Dg Chest Port 1 View  Result Date: 08/09/2016 CLINICAL DATA:  Pneumonia EXAM: PORTABLE CHEST 1 VIEW COMPARISON:  Chest x-ray of 08/28/2016 FINDINGS: A tracheostomy is again noted. Right PICC line tip overlies the lower SVC near the expected SVC -RA junction. There is moderate cardiomegaly present and there does appear to be at least a right pleural effusion. There is pulmonary vascular congestion, findings most consistent with mild congestive heart failure. Superimposed pneumonia would be difficult to exclude. IMPRESSION: 1. Suspect mild CHF with cardiomegaly, pulmonary vascular congestion, and small right pleural effusion. 2. Tracheostomy and right PICC line unchanged in position. Electronically Signed   By: [name]   On: 08/23/2016 10:15       Assessment / Plan:   30 y/o male with significant comorbidities to include  quadriplegia, trach dependant, with multiple sacral fracture severe osteomyelitis, chronic anemia requiring multiple transfusions. He is s/p diverting colostomy to prevent worsening of decubiti, who was admitted with bright red blood per ostomy and worsening anemia. Since admission no further blood per ostomy, stable Hgb, but has had mental status changes,  rising WBC (now 50s), with severe osteomyelitis / sacral fracture / abscesses. He is critically ill.   No plans for endoscopic evaluation at this time, he is not actively bleeding and is critically ill with high risk for mortality. Family resistant to palliative care consult. We will follow peripherally for now while other issues being addressed. If he recovers from his infection and stabilizes, please reconsult for consideration of endoscopy.  Burt Cellar, MD [facility] Pager [phone]

## 2016-08-30 NOTE — Progress Notes (Signed)
EEG completed; results pending.    

## 2016-08-30 NOTE — Progress Notes (Signed)
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 Started on Azactam without premedication and he has become lethargic and has mild swelling around trach without compromise. Solumedrol has been started and he is HD stable. Mother reports this is his usual reaction to Azactam. PCCM saw 11/21 and signed off.  Called back 11/23 for worsening lethargy and request for vent support.    SIGNIFICANT EVENTS  11/22>> surgical bx sacral bone/wound  STUDIES:  11/21 ct head >>neg MRI head 11/23>>>  ABx:  linezolid 11/19>>> tigecycline 11/21>>>  MICRO:  BC x 2 11/23>>> Sacral bone 11/22>>>  SUBJECTIVE:  Increasing lethargy this am.  Mild hypoxia on ABG so placed on vent with improvement in mental status per RN.  Remains lethargic but arousable on vent. Now in ICU.   VITAL SIGNS: Temp:  [97 F (36.1 C)-99.3 F (37.4 C)] 98.6 F (37 C) (11/23 1100) Pulse Rate:  [80-109] 106 (11/23 1435) Resp:  [6-143] 23 (11/23 1435) BP: (109-141)/(73-89) 123/87 (11/23 1435) SpO2:  [93 %-100 %] 95 % (11/23 1435) FiO2 (%):  [28 %-100 %] 100 % (11/23 1156)  PHYSICAL EXAMINATION: General:  Chronically ill appearing male, NAD on vent  Neuro:  Lethargic but arousable, quad HEENT:  Mm moist, no JVD, chronic trach #6 cuffed c/d  Cardiovascular:  s1s2 rrr Lungs:  resps even non labored on vent, scattered rhonchi  Abdomen:  Round, soft, +bs  Musculoskeletal:  Warm and dry, large sacral decub, dressed   Recent Labs Lab 08/28/16 0415 08/17/2016 0615 08/30/16 0500  NA 126* 129* 133*  K 4.6 4.8 4.4  CL 103 107 112*  CO2 18* 16* 16*  BUN 42* 42* 47*  CREATININE 1.00 1.23 1.33*  GLUCOSE 263* 244* 128*    Recent  Labs Lab 08/28/16 0415 09/05/2016 0615 08/30/16 0500  HGB 10.7* 11.0* 10.9*  HCT 32.1* 33.2* 33.2*  WBC 36.5* 34.1* 52.9*  PLT 412* 351 305   Dg Chest Port 1 View  Result Date: 08/30/2016 CLINICAL DATA:  Respiratory failure.  Hypoxia. EXAM: PORTABLE CHEST 1 VIEW COMPARISON:  One-view chest x-ray 08/21/2016 FINDINGS: Tracheostomy tube is in place. Right-sided PICC line is stable. The heart is enlarged. Diffuse interstitial and airspace disease is again seen. Bilateral pleural effusions have increased. IMPRESSION: 1. Cardiomegaly with increasing interstitial and airspace disease in bilateral pleural effusions compatible with congestive heart failure. 2. Increasing airspace disease likely reflects edema but infection is not excluded. 3. Support apparatus is stable. Electronically Signed   By: San Morelle M.D.   On: 08/30/2016 09:19   Dg Chest Port 1 View  Result Date: 08/28/2016 CLINICAL DATA:  Pneumonia EXAM: PORTABLE CHEST 1 VIEW COMPARISON:  Chest x-ray of 08/28/2016 FINDINGS: A tracheostomy is again noted. Right PICC line tip overlies the lower SVC near the expected SVC -RA junction. There is moderate cardiomegaly present and there does appear to be at least a right pleural effusion. There is pulmonary vascular congestion, findings most consistent with mild congestive heart failure. Superimposed pneumonia would be difficult to exclude. IMPRESSION: 1. Suspect mild CHF with cardiomegaly, pulmonary vascular congestion, and small right pleural effusion. 2. Tracheostomy and right PICC line unchanged in position. Electronically Signed   By: Eddie Dibbles  Alvester Chou M.D.   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  See below.  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  CT head neg.  Ammonia only mildly elevated. Has been intermittent throughout admission apparently.  Highly doubt this is reaction to abx as mother is concerned about.  Also doubt it is solely r/t hypoxia although he did initially have some improvement on vent. Likely multifactorial in setting polypharmacy, metabolic encephalopathy, anesthesia/OR 11/22, SIRS/sepsis and possibly overall decline.  No improvement over last few days after holding narcotics or with dose of narcan.  Did have episode of significant hypoglycemia (20) 11/20 which could have prompted AMS from which he has just not recovered.  PLAN - F/u ABG on vent  Hold ALL sedating medications  Neuro to see  MRI pending  EEG   Lower GI bleeding  Blood loss anemia  Diarrhea - improved  PLAN -  GI following  PPI  Continue TF  F/u CBC   AKI - improved  nonAG metabolic acidosis  hypoPhos  Hyponatremia  PLAN -  F/u chem  Replete electrolytes PRN  Lasix as above    Nickolas Madrid, NP 08/30/2016  3:00 PM Pager: [phone] or [phone]  ATTENDING NOTE / ATTESTATION NOTE :   I have discussed the case with the resident/APP Nickolas Madrid NP.   I agree with the resident/APP's  history, physical  examination, assessment, and plans.    I have edited the above note and modified it according to our agreed history, physical examination, assessment and plan.   Briefly, unfortunate pt with quadriplegia, with h/o vent use (not been on the vent for several mos), with chronic trache, with chronic infection related to sacral ulcer/bacteremia/possible HCAP. Noted to be lethargic since Sunday but thought to be multifactorial.  Pain meds were d/c and he got narcan w/ no improvement.  Ammonia was normal.  Noted to be obtunded this am.  ABG showed hypoxemia.  Placed on the vent and transferred to ICU.   Pt seen, examined. Exam as above. Asleep, not following commmands. Trached and on vent. VSS. Chronically ill. NAD. Rhonchi  in [place]. Good s1/s2. (+) PEG tube. Gr 2 edema. Cachectic.   Labs reviewed. CXR with no sig change.   Assesment and plan : Encephalopathy, multifactorial. R/O new infection. R/O subclinical szes. CT head was (-). Hypoxemic initially but rpt abg is better. Cont vent support for now. EEG underway. For MRI.  Cont abx. Will panculture. ID is following.   Other issues as above.   I have spent 30  minutes of critical care time with this patient today.  Family :  No family at bedside.    Monica Becton, MD 08/30/2016, 3:47 PM [facility] Pager [phone] After 3 pm or if no answer, call [phone]

## 2016-08-30 NOTE — Progress Notes (Signed)
Called d/t pt w/ increased WOB- to place pt on vent.  Pt appeared to have very noticeable "belly breathing" and unresponsive.  Pt placed on ordered vent settings.  Pt appears to be tolerating well currently, no distress currently noted.  Once placed on vent , WOB much improved.  Rapid Response RN in room currently.  VSS now. Sat 100%, HR 100

## 2016-08-30 NOTE — Progress Notes (Signed)
Pt unavailable for EEG at this time due to being transferred to ICU per RN

## 2016-08-30 NOTE — Progress Notes (Addendum)
PROGRESS NOTE    Jason [name]  [id number] DOB: December 13, 1985 DOA: 09/02/2016 PCP: [name], MD   Brief Narrative:  Jason [name] is a 30 y.o. 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 No apparent trauma to the abdomen recently. He did receive transfusions in the past, last one week ago during last admission. In addition, the patient had been increasingly lethargic, with decreased urine output over the last 3 days pta. Mother reported that the patient had large volume of watery diarrhea over the last  last 36 hours pta. No apparent fever or chills. There is no apparent bleeding from the tract site, or worsening breathing or increased respiratory secretions. He was admitted to SDU and Infectious Disease, Gastroenterology, Plastic surgery, PCCM and IR was consulted for further evaluation and management. PCCM was consulted to evaluate Swelling in Neck around Tracheostomy. Patient was unresponsive on 08/13/2016 but remained hemodynamically stable and maintaining airway.? Multifactorial related to profound sepsis, metabolic derangement and polypharmacy-were discontinued. Mild response with Narcan 2. Status post bone biopsy by plastic surgery on 11/22. Overall condition critical and poor prognosis. Discussed in detail with mother on 11/22 and she wished to continue aggressive care without change in full CODE STATUS and declined palliative care consultation. On 08/30/16, patient comatose, worsening acute respiratory failure with hypoxia. Consulted CCM and neurology. ID and [facility] already on board. Initiated mechanical ventilation via tracheostomy and transferred to ICU under CCM care. TRH will sign off after  11/23.  Assessment & Plan:   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 ([name])   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([place])   History of pulmonary embolism   DVT (deep venous thrombosis) ([name])   Severe protein-calorie malnutrition ([name])   Dysphagia, pharyngoesophageal phase   Decubitus ulcer of right perineal ischial region, stage 4 ([name])   Decubitus ulcer of left perineal ischial region, stage 4 ([name])   Sacral decubitus ulcer, stage IV ([name])   Multiple allergies   Recurrent UTI   Respiratory failure, chronic ([name])   Aspiration pneumonia of right lower lobe ([name])   Seizures ([name])   Anxiety state   Anemia of chronic disease   Hyponatremia   Encephalopathy   Other acute osteomyelitis, multiple sites ([place])   Lower GI bleed   Viridans streptococci infection   Coma of unknown cause ([name])   Acute encephalopathy/coma - Apparently had been in intermittent and ongoing problem since hospital admission. CT head negative for acute findings. Noted patient to be unresponsive to any stimuli including noxious stimuli on 11/22. This is likely multifactorial related to profound sepsis, metabolic derangement/TME (metabolic acidosis) and polypharmacy (fentanyl patch, Lyrica, Wellbutrin, baclofen). Discontinued culprit medications. Trial of Narcan 2 with mild immediate response. Ammonia minimally elevated and likely not contributing. As per discussion with mother and nursing staff, minimal if any improvement in mental status since 11/22 a.m. Hypoglycemia and hypoxia also on differentials. Neurology & CCM consulted 11/23.  Sepsis likely Multifactorial Etiologies with Evidence of Septic Arthritis within the Right SI Joint and Acute Osteomyelitis in the Adjacent Right illiac Bone with Changes of Multi Level Osteomyelitis throughout Vertebral Bodies of T8,  L1, L2, L4, and L5 with  Intramuscular Abscesses involving the Psoas, iliopsoas and iliacus muscles on the Right with Concomitant Dense Consolidation in the Lower lobes B/L compatible with PNA and Streptococcus Bacteremia, poA -Procalcitonin was 1.95 and will trend -> 1.25 - Was hydrated with IV fluids. Developed volume overload as evidenced by clinical exam and chest x-ray findings. Reduce IV fluids. Hemodynamically stable/hypotension resolved. -Restarted Midodrine 20 mg TID -Infectious Disease Consultation appreciated. Dr. Wilder Glade changed Abx and D/C'd Aztreonam and Vancomycin because patient had neck swelling from Abx-? Allergic reaction to aztreonam. Gave Solumedrol 1 dose on 11/21   -Infectious Diseases Started Linezolid and Tigecycline -Blood Cx showed Streptococcus Species in BLOOD , Urine Cx : Polymicrobial (Enterococcus faecalis & Acinetobacter-possibly colonization rather than true infection), MRSA PCR + -Discussed with IR for Tissue Biopsy in Am; Will not Purse per Dr. Toni Amend Hoss in IR as it is contiguous - S/P bone biopsy of sacral bone by plastic surgery on 11/22. Follow results. - Patient at high risk for aspiration.  HCAP with Recent Aspiration PNA -Chronic Tracheostomy - Dr. Wilder Glade changed Abx and D/C'd Aztreonam and Vancomycin because patient had neck swelling from Abx. Gave Solumedrol   -Abx Changed to Linezolid and Tigecycline - Aggressive pulmonary toilet. - Worsening respiratory failure. Started mechanical ventilation after discussing with CCM and transferred patient to ICU.  Hypoglycemia in the Setting of Diabetes Mellitus Type 1/hyperglycemia -Patient was hypoglycemic with CBG in the 20s on 08/27/16. No hypoglycemia noted since then. Now hyperglycemic, likely contributed by Solu-Medrol that he received on 11/21. Reduce IV D5W due to hyperglycemia and volume overload and continue SSI. - Consider low-dose Lantus when patient resumes tube feeds and if CBGs remained consistently elevated. Currently  without DKA.  - ? Contributed to his mental status changes.   Hyponatremia -Na+ was 126 11/21 which has improved to 133 on 11/23. Clinically improved. Follow BMP. - Now may be related to hypervolemia. Giving a dose of IV Lasix for volume overload. Follow BMP in a.m.  Hypotension -Gave 1 L of NS bolus and Transfusion of 2 units of pRBC's -Restarted Midodrine 20 mg po 3 times a daily with meals -Continue to Monitor. Resolved.  Watery Stools r/o C Difficile given recent Abx Usage -R/o C Difficile -Continue to Monitor stool output. Resolved.  Suspected Acute Lower GI Bleed and Blood Loss Anemia in the Setting of Steroid Use s/p Transfusion of 2 units of pRBC's -FOBT + -Hb/Hct went from 7.9/24.6 -> 7.0/22.0 -> 10.7/32.1 -Transfused 2 units of pRBC's -GI consultation and follow-up appreciated. Bleeding from diverting colostomy-low volume of blood persists >? Colitis (ischemic, infectious, less likely IBD versus UGI etiology given PPI replaced by Pepcid and on Solu-Medrol for 4 weeks prior to admission. Now back on Protonix. GI indicates that with resolution of blood per ostomy, that is no urgency to pursue colonoscopy via ostomy and I agree. GI will reassess in a few days. They recommended resuming tube feeds (currently on hold for possible surgery this afternoon) - Per GI follow-up 11/23, no further blood per ostomy, stable hemoglobin and hence no plans for endoscopy evaluation at this time.  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 Please see discussion above.  Hypophosphetemia -Repleted with Potassium Phosphate 40 mEQ  Leaking G Tube -IR Consulted and Replaced Gastric Bumper without complication; - Tube feeds resumed 11/22 been no report of leaking.  Hx of PE s/p IVC Filter -Not on Anticoagulation   Acute on chronic respiratory failure with hypoxia/tracheostomy dependent/acute diastolic CHF - 2-D echo [id number] showed normal systolic function. Features suggestive of volume overload clinically and by chest x-ray. Likely due to volume resuscitation for sepsis.  - On 11/23, worsening respiratory distress and hypoxia. Clinically has anasarca. Chest x-ray concerning for pulmonary edema and/or infiltrates. Give Lasix 60 mg IV 1. ABG reviewed, PO2 43. CCM consulted. Started mechanical ventilation and transferred patient to ICU under CCM care. Started scopolamine patch for secretions.   Acute kidney injury - Creatinine has come up from 1 >1.3. Multifactorial. At risk for worsening. Monitor daily BMPs while on Lasix..  Severe protein calorie malnutrition - Albumin 1.5, anasarca. Continue tube feeds when able.  Non-anion gap metabolic acidosis - on [id number]: Bicarbonate 16 and AG 6. Lactate normal. Likely related to sepsis. ABG reviewed 11/22 : PH 7.234, PCO2 35, PO2 91, bicarbonate 14.4 and oxygen saturation 96.8.   Adult failure to thrive  - Multifactorial. Discussed several times with patient's mother regarding his critical illness and high risk for further decline and even death. She states that she has been caring for him for several years and he has had similar occurrence in the past from which she has "bounced back". She wishes for him to continue with full code and aggressive care. Palliative care consulted to assist with goals of care.   Anasarca - Multifactorial secondary to hypoalbuminemia and volume resuscitation.  IV Lasix.  Adrenal insufficiency - On maintenance hydrocortisone via PEG tube.    DVT prophylaxis: SCDs Code Status: FULL Family Communication: Discussed with Mother at bedside in detail. Updated care and answered questions. Advised her that her son is critically ill and could even worsen and she expressed understanding. She continues to indicate full code.  Disposition Plan: Started mechanical ventilation and transferred to ICU.  Consultants:   Infectious Diseases  [facility]-signed off 11/23   Interventional Radiology  [place] 11/21 appreciated. Signed off. Re consulted 11/23   Plastic Surgery  Procedures:   None. Has indwelling suprapubic catheter, PEG tube, colostomy and tracheostomy  Started mechanical ventilation 11/23  Antimicrobials:   IV Vancomycin and IV Aztreonam D/C'd. Switched to IV Linezolid and IV Tigecycline.  Subjective: Unresponsive to any stimuli. As per mother and nursing, no significant change in mental status since yesterday. Briefly said a word or 2 to mother before surgery yesterday. Increased upper airway secretions. Tolerating PEG tube feeds. No bleeding reported from colostomy.   Objective: Vitals:   08/30/16 1400 08/30/16 1435 08/30/16 1500 08/30/16 1600  BP: 131/82 123/87 125/87 121/84  Pulse: (!) 108 (!) 106 (!) 103 (!) 113  Resp: 20 (!) 23 18 (!) 23  Temp:      TempSrc:      SpO2: 100% 95% 96% 91%  Weight:      Height:        Intake/Output Summary (Last 24 hours) at 08/30/16 1651 Last data filed at 08/30/16 1500  Gross per 24 hour  Intake  641.17 ml  Output              900 ml  Net          -258.83 ml   Filed Weights   09/05/2016 1025  Weight: 54.9 kg (121 lb)   Examination: Physical Exam:  Constitutional: Thin chronically  ill appearing AAM who is Unresponsive to any stimuli. Eyes: lids normal but there is conjuctival pallor, sclerae anicteric. Conjunctival edema bilaterallyRight >left.  Neck:  Appears normal, supple, no cervical masses, normal ROM, no appreciable thyromegaly. Tracheostomy in place Respiratory: Decreased breath sounds bilaterally with scattered bilateral rhonchi and crackles. Mild increased work of breathing and abdominal breathing.   Cardiovascular: RRR, no murmurs / rubs / gallops. S1 and S2 auscultated. No extremity edema. facial and upper extremity edema. Telemetry: Sinus rhythm.  Abdomen: Soft, non-tender, non-distended. No masses palpated. No appreciable hepatosplenomegaly. Bowel sounds positive. PEG in place and Ostomy bag is empty. Suprapubic tube +.   GU: Suprapubic Catheter in place draining yellow urine.  Musculoskeletal: No clubbing / cyanosis of digits/nails. Mild Contractures Skin: Warm and Dry with Large Sacral ulcer and wounds on heels. Pink and dry on some.  Neurologic:  unresponsive to any stimuli. Bilateral pupils 3 mm sluggishly reacting to right. No focal neurological deficits. Psychiatric: Unable to assess because of current condition.   Data Reviewed: I have personally reviewed following labs and imaging studies  CBC:  Recent Labs Lab 08/24/2016 1116 08/27/16 0950 08/27/16 2030 08/28/16 0415 09/03/2016 0615 08/30/16 0500  WBC 30.8* 29.1*  --  36.5* 34.1* 52.9*  NEUTROABS 27.4* 26.5*  --  31.7* 31.3*  --   HGB 7.9* 7.0* 10.9* 10.7* 11.0* 10.9*  HCT 24.6* 22.0* 32.4* 32.1* 33.2* 33.2*  MCV 83.1 83.0  --  81.3 81.8 82.8  PLT 503* 372  --  412* 351 [id number]   Basic Metabolic Panel:  Recent Labs Lab 08/18/2016 1116 08/27/16 0950 08/28/16 0415 08/12/2016 0615 08/30/16 0500  NA 123* 127* 126* 129* 133*  K 4.2 3.7 4.6 4.8 4.4  CL 99* 104 103 107 112*  CO2 20* 20* 18* 16* 16*  GLUCOSE 125* 22* 263* 244* 128*  BUN 40* 40* 42* 42* 47*  CREATININE 0.76 0.70 1.00 1.23 1.33*  CALCIUM 8.7* 8.4* 8.2* 8.0* 8.1*  MG  --  1.9 1.9 1.8  --   PHOS  --  <1.0* 3.3 4.0  --    GFR: Estimated Creatinine Clearance: 61.9 mL/min (by C-G formula based on SCr of  1.33 mg/dL (H)). Liver Function Tests:  Recent Labs Lab 09/02/2016 1116 08/27/16 0950 08/28/16 0415 08/30/2016 0615 08/30/16 0500  AST 14* 15 14* 11* 15  ALT 10* 11* 12* 12* 12*  ALKPHOS 92 100 117 116 124  BILITOT 0.3 0.2* 0.4 0.3 0.5  PROT 4.9* 4.6* 5.4* 5.4* 5.1*  ALBUMIN 1.5* 1.4* 1.6* 1.5* 1.5*    Recent Labs Lab 08/21/2016 1116  LIPASE 13    Recent Labs Lab 08/31/2016 1055  AMMONIA 39*   Coagulation Profile:  Recent Labs Lab 08/28/2016 1756  INR 1.24   Cardiac Enzymes: No results for input(s): CKTOTAL, CKMB, CKMBINDEX, TROPONINI in the last 168 hours. BNP (last 3 results) No results for input(s): PROBNP in the last 8760 hours. HbA1C: No results for input(s): HGBA1C in the last 72 hours. CBG:  Recent Labs Lab 08/30/16 0055 08/30/16 0350 08/30/16 0800 08/30/16 1152 08/30/16 1619  GLUCAP 119* 128* 124* 184* 197*   Lipid Profile: No results for input(s):  CHOL, HDL, LDLCALC, TRIG, CHOLHDL, LDLDIRECT in the last 72 hours. Thyroid Function Tests: No results for input(s): TSH, T4TOTAL, FREET4, T3FREE, THYROIDAB in the last 72 hours. Anemia Panel: No results for input(s): VITAMINB12, FOLATE, FERRITIN, TIBC, IRON, RETICCTPCT in the last 72 hours. Sepsis Labs:  Recent Labs Lab 08/12/2016 1127 08/08/2016 1756 08/31/2016 1757 08/18/2016 2018 08/28/16 0415 08/09/2016 1055 08/30/16 0500  PROCALCITON  --  1.95  --   --  1.25  --  0.57  LATICACIDVEN 0.62  --  0.6 0.8  --  1.5  --     Recent Results (from the past 240 hour(s))  Urine culture     Status: Abnormal   Collection Time: 08/22/2016 12:04 PM  Result Value Ref Range Status   Specimen Description URINE, SUPRAPUBIC  Final   Special Requests NONE  Final   Culture (A)  Final    >=100,000 COLONIES/mL ENTEROCOCCUS FAECALIS >=100,000 COLONIES/mL YEAST 20,000 COLONIES/mL ACINETOBACTER CALCOACETICUS/BAUMANNII COMPLEX    Report Status 08/22/2016 FINAL  Final   Organism ID, Bacteria ENTEROCOCCUS FAECALIS (A)  Final    Organism ID, Bacteria ACINETOBACTER CALCOACETICUS/BAUMANNII COMPLEX (A)  Final      Susceptibility   Acinetobacter calcoaceticus/baumannii complex - MIC*    CEFTAZIDIME >=64 RESISTANT Resistant     CEFTRIAXONE >=64 RESISTANT Resistant     CIPROFLOXACIN >=4 RESISTANT Resistant     GENTAMICIN 4 SENSITIVE Sensitive     IMIPENEM 1 SENSITIVE Sensitive     PIP/TAZO <=4 SENSITIVE Sensitive     TRIMETH/SULFA >=320 RESISTANT Resistant     CEFEPIME >=64 RESISTANT Resistant     AMPICILLIN/SULBACTAM 4 SENSITIVE Sensitive     * 20,000 COLONIES/mL ACINETOBACTER CALCOACETICUS/BAUMANNII COMPLEX   Enterococcus faecalis - MIC*    AMPICILLIN <=2 SENSITIVE Sensitive     LEVOFLOXACIN >=8 RESISTANT Resistant     NITROFURANTOIN <=16 SENSITIVE Sensitive     VANCOMYCIN 1 SENSITIVE Sensitive     * >=100,000 COLONIES/mL ENTEROCOCCUS FAECALIS  Blood Culture (routine x 2)     Status: Abnormal (Preliminary result)   Collection Time: 08/27/2016 12:07 PM  Result Value Ref Range Status   Specimen Description BLOOD LEFT WRIST  Final   Special Requests BOTTLES DRAWN AEROBIC AND ANAEROBIC 10CC  Final   Culture  Setup Time   Final    IN BOTH AEROBIC AND ANAEROBIC BOTTLES GRAM POSITIVE COCCI IN PAIRS AND CHAINS CRITICAL RESULT CALLED TO, READ BACK BY AND VERIFIED WITH: ANDY JOHNSTON,PHARMD @0013  08/28/16 MKELLY,MLT    Culture (A)  Final    VIRIDANS STREPTOCOCCUS REPEATING SUSCEPTIBILTIES    Report Status PENDING  Incomplete  Blood Culture ID Panel (Reflexed)     Status: Abnormal   Collection Time: 08/16/2016 12:07 PM  Result Value Ref Range Status   Enterococcus species NOT DETECTED NOT DETECTED Final   Listeria monocytogenes NOT DETECTED NOT DETECTED Final   Staphylococcus species NOT DETECTED NOT DETECTED Final   Staphylococcus aureus NOT DETECTED NOT DETECTED Final   Streptococcus species DETECTED (A) NOT DETECTED Final    Comment: CRITICAL RESULT CALLED TO, READ BACK BY AND VERIFIED WITH: ANDY JOHNSTON,PHARMD  @0013  08/28/16 MKELLY,MLT    Streptococcus agalactiae NOT DETECTED NOT DETECTED Final   Streptococcus pneumoniae NOT DETECTED NOT DETECTED Final   Streptococcus pyogenes NOT DETECTED NOT DETECTED Final   Acinetobacter baumannii NOT DETECTED NOT DETECTED Final   Enterobacteriaceae species NOT DETECTED NOT DETECTED Final   Enterobacter cloacae complex NOT DETECTED NOT DETECTED Final   Escherichia coli NOT DETECTED NOT DETECTED  Final   Klebsiella oxytoca NOT DETECTED NOT DETECTED Final   Klebsiella pneumoniae NOT DETECTED NOT DETECTED Final   Proteus species NOT DETECTED NOT DETECTED Final   Serratia marcescens NOT DETECTED NOT DETECTED Final   Haemophilus influenzae NOT DETECTED NOT DETECTED Final   Neisseria meningitidis NOT DETECTED NOT DETECTED Final   Pseudomonas aeruginosa NOT DETECTED NOT DETECTED Final   Candida albicans NOT DETECTED NOT DETECTED Final   Candida glabrata NOT DETECTED NOT DETECTED Final   Candida krusei NOT DETECTED NOT DETECTED Final   Candida parapsilosis NOT DETECTED NOT DETECTED Final   Candida tropicalis NOT DETECTED NOT DETECTED Final  Culture, blood (x 2)     Status: None (Preliminary result)   Collection Time: 09/03/2016  6:10 PM  Result Value Ref Range Status   Specimen Description BLOOD RIGHT HAND  Final   Special Requests IN PEDIATRIC BOTTLE 2CC  Final   Culture  Setup Time   Final    GRAM POSITIVE COCCI IN PAIRS IN CHAINS IN PEDIATRIC BOTTLE CRITICAL RESULT CALLED TO, READ BACK BY AND VERIFIED WITH: VERONDA BRYK,PHARMD @0206  08/31/2016 MKELLY,MLT    Culture CULTURE REINCUBATED FOR BETTER GROWTH  Final   Report Status PENDING  Incomplete  Culture, blood (x 2)     Status: Abnormal (Preliminary result)   Collection Time: 09/05/2016  6:14 PM  Result Value Ref Range Status   Specimen Description BLOOD RIGHT THUMB  Final   Special Requests IN PEDIATRIC BOTTLE 1CC  Final   Culture  Setup Time   Final    GRAM POSITIVE COCCI IN PAIRS IN PEDIATRIC  BOTTLE CRITICAL VALUE NOTED.  VALUE IS CONSISTENT WITH PREVIOUSLY REPORTED AND CALLED VALUE.    Culture (A)  Final    STREPTOCOCCUS CONSTELLATUS SUSCEPTIBILITIES TO FOLLOW    Report Status PENDING  Incomplete  MRSA PCR Screening     Status: Abnormal   Collection Time: 08/27/16  3:50 AM  Result Value Ref Range Status   MRSA by PCR POSITIVE (A) NEGATIVE Final    Comment:        The GeneXpert MRSA Assay (FDA approved for NASAL specimens only), is one component of a comprehensive MRSA colonization surveillance program. It is not intended to diagnose MRSA infection nor to guide or monitor treatment for MRSA infections. RESULT CALLED TO, READ BACK BY AND VERIFIED WITH: EVERETTE,D RN 08/27/16 AT 0515 SKEEN,P   Aerobic/Anaerobic Culture (surgical/deep wound)     Status: None (Preliminary result)   Collection Time: 09/05/2016  4:01 PM  Result Value Ref Range Status   Specimen Description TISSUE BONE  Final   Special Requests SACRAL  Final   Gram Stain   Final    ABUNDANT WBC PRESENT, PREDOMINANTLY PMN ABUNDANT GRAM NEGATIVE RODS ABUNDANT GRAM POSITIVE COCCI IN PAIRS FEW GRAM POSITIVE RODS    Culture MODERATE GRAM NEGATIVE RODS  Final   Report Status PENDING  Incomplete    Radiology Studies: Dg Chest Port 1 View  Result Date: 08/30/2016 CLINICAL DATA:  Respiratory failure.  Hypoxia. EXAM: PORTABLE CHEST 1 VIEW COMPARISON:  One-view chest x-ray 09/01/2016 FINDINGS: Tracheostomy tube is in place. Right-sided PICC line is stable. The heart is enlarged. Diffuse interstitial and airspace disease is again seen. Bilateral pleural effusions have increased. IMPRESSION: 1. Cardiomegaly with increasing interstitial and airspace disease in bilateral pleural effusions compatible with congestive heart failure. 2. Increasing airspace disease likely reflects edema but infection is not excluded. 3. Support apparatus is stable. Electronically Signed   By: Harrell Gave  Mattern M.D.   On: 08/30/2016 09:19    Dg Chest Port 1 View  Result Date: 08/30/2016 CLINICAL DATA:  Pneumonia EXAM: PORTABLE CHEST 1 VIEW COMPARISON:  Chest x-ray of 08/28/2016 FINDINGS: A tracheostomy is again noted. Right PICC line tip overlies the lower SVC near the expected SVC -RA junction. There is moderate cardiomegaly present and there does appear to be at least a right pleural effusion. There is pulmonary vascular congestion, findings most consistent with mild congestive heart failure. Superimposed pneumonia would be difficult to exclude. IMPRESSION: 1. Suspect mild CHF with cardiomegaly, pulmonary vascular congestion, and small right pleural effusion. 2. Tracheostomy and right PICC line unchanged in position. Electronically Signed   By: [name]   On: 08/14/2016 10:15   Scheduled Meds: . albuterol  2.5 mg Nebulization QID  . Chlorhexidine Gluconate Cloth  6 each Topical Q0600  . hydrocortisone  10 mg Per Tube QPM  . hydrocortisone  20 mg Per Tube q morning - 10a  . insulin aspart  0-9 Units Subcutaneous Q4H  . linezolid (ZYVOX) IV  600 mg Intravenous Q12H  . midodrine  20 mg Per Tube TID WC  . mupirocin ointment  1 application Nasal BID  . oxybutynin  5 mg Per Tube BID  . pantoprazole  40 mg Oral Daily  . scopolamine  1 patch Transdermal Q72H  . tigecycline (TYGACIL) IVPB  100 mg Intravenous Once   Followed by  . tigecycline (TYGACIL) IVPB  50 mg Intravenous Q12H   Continuous Infusions: . dextrose 5 % and 0.9% NaCl 10 mL/hr at 08/30/16 0659  . feeding supplement (JEVITY 1.2 CAL) 1,000 mL (08/30/16 0000)    LOS: 4 days    I spent greater than 60 minutes coordinating patient's care. I discussed patient care at length with ID, neurology, [facility] and CCM. I also discussed extensively with patient's mother updating her on his care and answering her questions.  Vernell Leep, MD, FACP, FHM. [facility] Pager [phone]  If 7PM-7AM, please contact night-coverage [email or web address] Password  [facility] 08/30/2016, 4:51 PM

## 2016-08-30 NOTE — Progress Notes (Signed)
Subjective:  Mom and dad are worried about the patient's worsening clinical status and the edema and his face and right side.  Antibiotics:  Anti-infectives    Start     Dose/Rate Route Frequency Ordered Stop   09/03/2016 0557  tigecycline (TYGACIL) 50 mg in sodium chloride 0.9 % 100 mL IVPB     50 mg 200 mL/hr over 30 Minutes Intravenous Every 12 hours 08/28/16 1758     08/28/16 1800  tigecycline (TYGACIL) 100 mg in sodium chloride 0.9 % 100 mL IVPB     100 mg 200 mL/hr over 30 Minutes Intravenous  Once 08/28/16 1758     08/28/16 1300  linezolid (ZYVOX) IVPB 600 mg     600 mg 300 mL/hr over 60 Minutes Intravenous Every 12 hours 08/28/16 1239     08/28/16 0200  aztreonam (AZACTAM) 1 g in dextrose 5 % 50 mL IVPB  Status:  Discontinued     1 g 100 mL/hr over 30 Minutes Intravenous Every 8 hours 08/27/16 1802 08/28/16 1232   08/27/16 2000  vancomycin (VANCOCIN) 500 mg in sodium chloride 0.9 % 100 mL IVPB  Status:  Discontinued     500 mg 100 mL/hr over 60 Minutes Intravenous Every 12 hours 08/27/16 1802 08/28/16 1239   08/27/16 1800  aztreonam (AZACTAM) 2 g in dextrose 5 % 50 mL IVPB     2 g 100 mL/hr over 30 Minutes Intravenous  Once 08/27/16 1634 08/27/16 1852   08/27/16 1600  levofloxacin (LEVAQUIN) IVPB 750 mg  Status:  Discontinued     750 mg 100 mL/hr over 90 Minutes Intravenous Every 24 hours 08/23/2016 1324 08/19/2016 1522   08/27/16 0000  vancomycin (VANCOCIN) 500 mg in sodium chloride 0.9 % 100 mL IVPB  Status:  Discontinued     500 mg 100 mL/hr over 60 Minutes Intravenous Every 12 hours 08/14/2016 1324 08/27/2016 1522   08/20/2016 2200  aztreonam (AZACTAM) 1 g in dextrose 5 % 50 mL IVPB  Status:  Discontinued     1 g 100 mL/hr over 30 Minutes Intravenous Every 8 hours 08/25/2016 1324 08/30/2016 1522   08/31/2016 1600  linezolid (ZYVOX) tablet 600 mg  Status:  Discontinued     600 mg Oral Every 12 hours 08/15/2016 1458 08/27/16 1347   08/19/2016 1300  levofloxacin (LEVAQUIN) IVPB  750 mg  Status:  Discontinued     750 mg 100 mL/hr over 90 Minutes Intravenous  Once 08/11/2016 1246 09/02/2016 1522   09/03/2016 1300  aztreonam (AZACTAM) 2 g in dextrose 5 % 50 mL IVPB  Status:  Discontinued     2 g 100 mL/hr over 30 Minutes Intravenous  Once 08/30/2016 1246 08/25/2016 1522   08/11/2016 1300  vancomycin (VANCOCIN) IVPB 1000 mg/200 mL premix  Status:  Discontinued     1,000 mg 200 mL/hr over 60 Minutes Intravenous  Once 09/01/2016 1246 08/21/2016 1522      Medications: Scheduled Meds: . albuterol  2.5 mg Nebulization QID  . Chlorhexidine Gluconate Cloth  6 each Topical Q0600  . hydrocortisone  10 mg Per Tube QPM  . hydrocortisone  20 mg Per Tube q morning - 10a  . insulin aspart  0-9 Units Subcutaneous Q4H  . linezolid (ZYVOX) IV  600 mg Intravenous Q12H  . midodrine  20 mg Per Tube TID WC  . mupirocin ointment  1 application Nasal BID  . oxybutynin  5 mg Per Tube BID  . pantoprazole  40 mg Oral Daily  . scopolamine  1 patch Transdermal Q72H  . tigecycline (TYGACIL) IVPB  100 mg Intravenous Once   Followed by  . tigecycline (TYGACIL) IVPB  50 mg Intravenous Q12H   Continuous Infusions: . dextrose 5 % and 0.9% NaCl 10 mL/hr at 08/30/16 0659  . feeding supplement (JEVITY 1.2 CAL) 1,000 mL (08/30/16 0000)   PRN Meds:.acetaminophen, bisacodyl, naLOXone (NARCAN)  injection    Objective: Weight change:   Intake/Output Summary (Last 24 hours) at 08/30/16 1223 Last data filed at 08/30/16 0900  Gross per 24 hour  Intake          1876.17 ml  Output              500 ml  Net          1376.17 ml   Blood pressure 114/73, pulse (!) 107, temperature 98.6 F (37 C), temperature source Axillary, resp. rate 16, height 5\' 8"  (1.727 m), weight 121 lb (54.9 kg), SpO2 100 %. Temp:  [97 F (36.1 C)-99.3 F (37.4 C)] 98.6 F (37 C) (11/23 1100) Pulse Rate:  [80-107] 107 (11/23 1156) Resp:  [6-143] 16 (11/23 1156) BP: (109-141)/(72-89) 114/73 (11/23 1156) SpO2:  [93 %-100 %] 100 %  (11/23 1156) FiO2 (%):  [28 %-100 %] 100 % (11/23 1156)  Physical Exam: General: Fairly unresponsive HEENT: anicteric sclera, extraocular movement intact pupils are fairly fixed and not responsive his eyes and cells are edematous as are his eyelids and face CVS tachycardic rate, normal r,  no murmur rubs or gallops Chest: + rhonchi Abdomen: soft nondistended, normal bowel sounds, Extremities: Edematous in particular in his upper extremities face  Skin: Anterior decubitus ulcers are visible not posterior ones   Neuro: Unresponsive  CBC:  CBC Latest Ref Rng & Units 08/30/2016 08/09/2016 08/28/2016  WBC 4.0 - 10.5 K/uL 52.9(HH) 34.1(H) 36.5(H)  Hemoglobin 13.0 - 17.0 g/dL 10.9(L) 11.0(L) 10.7(L)  Hematocrit 39.0 - 52.0 % 33.2(L) 33.2(L) 32.1(L)  Platelets 150 - 400 K/uL 305 351 412(H)      BMET  Recent Labs  08/10/2016 0615 08/30/16 0500  NA 129* 133*  K 4.8 4.4  CL 107 112*  CO2 16* 16*  GLUCOSE 244* 128*  BUN 42* 47*  CREATININE 1.23 1.33*  CALCIUM 8.0* 8.1*     Liver Panel   Recent Labs  08/20/2016 0615 08/30/16 0500  PROT 5.4* 5.1*  ALBUMIN 1.5* 1.5*  AST 11* 15  ALT 12* 12*  ALKPHOS 116 124  BILITOT 0.3 0.5       Sedimentation Rate No results for input(s): ESRSEDRATE in the last 72 hours. C-Reactive Protein No results for input(s): CRP in the last 72 hours.  Micro Results: Recent Results (from the past 720 hour(s))  Urine culture     Status: Abnormal   Collection Time: 08/09/16  9:05 PM  Result Value Ref Range Status   Specimen Description URINE, RANDOM  Final   Special Requests NONE  Final   Culture MULTIPLE SPECIES PRESENT, SUGGEST RECOLLECTION (A)  Final   Report Status 08/11/2016 FINAL  Final  Blood Culture (routine x 2)     Status: None   Collection Time: 08/09/16 10:30 PM  Result Value Ref Range Status   Specimen Description BLOOD LEFT HAND  Final   Special Requests IN PEDIATRIC BOTTLE 1CC  Final   Culture NO GROWTH 5 DAYS  Final    Report Status 08/14/2016 FINAL  Final  Blood Culture (routine x 2)  Status: None   Collection Time: 08/09/16 10:45 PM  Result Value Ref Range Status   Specimen Description BLOOD RIGHT HAND  Final   Special Requests IN PEDIATRIC BOTTLE 2ML  Final   Culture NO GROWTH 5 DAYS  Final   Report Status 08/14/2016 FINAL  Final  Culture, blood (Routine X 2) w Reflex to ID Panel     Status: None   Collection Time: 08/11/16 11:40 AM  Result Value Ref Range Status   Specimen Description BLOOD LEFT ANTECUBITAL  Final   Special Requests IN PEDIATRIC BOTTLE 1CC  Final   Culture NO GROWTH 5 DAYS  Final   Report Status 08/16/2016 FINAL  Final  Culture, blood (Routine X 2) w Reflex to ID Panel     Status: None   Collection Time: 08/11/16 11:45 AM  Result Value Ref Range Status   Specimen Description BLOOD BLOOD LEFT HAND  Final   Special Requests IN PEDIATRIC BOTTLE 3CC  Final   Culture NO GROWTH 5 DAYS  Final   Report Status 08/16/2016 FINAL  Final  Urine culture     Status: Abnormal   Collection Time: 09/04/2016 12:04 PM  Result Value Ref Range Status   Specimen Description URINE, SUPRAPUBIC  Final   Special Requests NONE  Final   Culture (A)  Final    >=100,000 COLONIES/mL ENTEROCOCCUS FAECALIS >=100,000 COLONIES/mL YEAST 20,000 COLONIES/mL ACINETOBACTER CALCOACETICUS/BAUMANNII COMPLEX    Report Status 08/17/2016 FINAL  Final   Organism ID, Bacteria ENTEROCOCCUS FAECALIS (A)  Final   Organism ID, Bacteria ACINETOBACTER CALCOACETICUS/BAUMANNII COMPLEX (A)  Final      Susceptibility   Acinetobacter calcoaceticus/baumannii complex - MIC*    CEFTAZIDIME >=64 RESISTANT Resistant     CEFTRIAXONE >=64 RESISTANT Resistant     CIPROFLOXACIN >=4 RESISTANT Resistant     GENTAMICIN 4 SENSITIVE Sensitive     IMIPENEM 1 SENSITIVE Sensitive     PIP/TAZO <=4 SENSITIVE Sensitive     TRIMETH/SULFA >=320 RESISTANT Resistant     CEFEPIME >=64 RESISTANT Resistant     AMPICILLIN/SULBACTAM 4 SENSITIVE  Sensitive     * 20,000 COLONIES/mL ACINETOBACTER CALCOACETICUS/BAUMANNII COMPLEX   Enterococcus faecalis - MIC*    AMPICILLIN <=2 SENSITIVE Sensitive     LEVOFLOXACIN >=8 RESISTANT Resistant     NITROFURANTOIN <=16 SENSITIVE Sensitive     VANCOMYCIN 1 SENSITIVE Sensitive     * >=100,000 COLONIES/mL ENTEROCOCCUS FAECALIS  Blood Culture (routine x 2)     Status: Abnormal (Preliminary result)   Collection Time: 08/29/2016 12:07 PM  Result Value Ref Range Status   Specimen Description BLOOD LEFT WRIST  Final   Special Requests BOTTLES DRAWN AEROBIC AND ANAEROBIC 10CC  Final   Culture  Setup Time   Final    IN BOTH AEROBIC AND ANAEROBIC BOTTLES GRAM POSITIVE COCCI IN PAIRS AND CHAINS CRITICAL RESULT CALLED TO, READ BACK BY AND VERIFIED WITH: ANDY JOHNSTON,PHARMD @0013  08/28/16 MKELLY,MLT    Culture (A)  Final    VIRIDANS STREPTOCOCCUS REPEATING SUSCEPTIBILTIES    Report Status PENDING  Incomplete  Blood Culture ID Panel (Reflexed)     Status: Abnormal   Collection Time: 08/11/2016 12:07 PM  Result Value Ref Range Status   Enterococcus species NOT DETECTED NOT DETECTED Final   Listeria monocytogenes NOT DETECTED NOT DETECTED Final   Staphylococcus species NOT DETECTED NOT DETECTED Final   Staphylococcus aureus NOT DETECTED NOT DETECTED Final   Streptococcus species DETECTED (A) NOT DETECTED Final    Comment: CRITICAL RESULT CALLED  TO, READ BACK BY AND VERIFIED WITH: ANDY JOHNSTON,PHARMD @0013  08/28/16 MKELLY,MLT    Streptococcus agalactiae NOT DETECTED NOT DETECTED Final   Streptococcus pneumoniae NOT DETECTED NOT DETECTED Final   Streptococcus pyogenes NOT DETECTED NOT DETECTED Final   Acinetobacter baumannii NOT DETECTED NOT DETECTED Final   Enterobacteriaceae species NOT DETECTED NOT DETECTED Final   Enterobacter cloacae complex NOT DETECTED NOT DETECTED Final   Escherichia coli NOT DETECTED NOT DETECTED Final   Klebsiella oxytoca NOT DETECTED NOT DETECTED Final   Klebsiella  pneumoniae NOT DETECTED NOT DETECTED Final   Proteus species NOT DETECTED NOT DETECTED Final   Serratia marcescens NOT DETECTED NOT DETECTED Final   Haemophilus influenzae NOT DETECTED NOT DETECTED Final   Neisseria meningitidis NOT DETECTED NOT DETECTED Final   Pseudomonas aeruginosa NOT DETECTED NOT DETECTED Final   Candida albicans NOT DETECTED NOT DETECTED Final   Candida glabrata NOT DETECTED NOT DETECTED Final   Candida krusei NOT DETECTED NOT DETECTED Final   Candida parapsilosis NOT DETECTED NOT DETECTED Final   Candida tropicalis NOT DETECTED NOT DETECTED Final  Culture, blood (x 2)     Status: None (Preliminary result)   Collection Time: 08/14/2016  6:10 PM  Result Value Ref Range Status   Specimen Description BLOOD RIGHT HAND  Final   Special Requests IN PEDIATRIC BOTTLE 2CC  Final   Culture  Setup Time   Final    GRAM POSITIVE COCCI IN PAIRS IN CHAINS IN PEDIATRIC BOTTLE CRITICAL RESULT CALLED TO, READ BACK BY AND VERIFIED WITH: VERONDA BRYK,PHARMD @0206  08/28/2016 MKELLY,MLT    Culture CULTURE REINCUBATED FOR BETTER GROWTH  Final   Report Status PENDING  Incomplete  Culture, blood (x 2)     Status: Abnormal (Preliminary result)   Collection Time: 09/05/2016  6:14 PM  Result Value Ref Range Status   Specimen Description BLOOD RIGHT THUMB  Final   Special Requests IN PEDIATRIC BOTTLE 1CC  Final   Culture  Setup Time   Final    GRAM POSITIVE COCCI IN PAIRS IN PEDIATRIC BOTTLE CRITICAL VALUE NOTED.  VALUE IS CONSISTENT WITH PREVIOUSLY REPORTED AND CALLED VALUE.    Culture (A)  Final    STREPTOCOCCUS CONSTELLATUS SUSCEPTIBILITIES TO FOLLOW    Report Status PENDING  Incomplete  MRSA PCR Screening     Status: Abnormal   Collection Time: 08/27/16  3:50 AM  Result Value Ref Range Status   MRSA by PCR POSITIVE (A) NEGATIVE Final    Comment:        The GeneXpert MRSA Assay (FDA approved for NASAL specimens only), is one component of a comprehensive MRSA  colonization surveillance program. It is not intended to diagnose MRSA infection nor to guide or monitor treatment for MRSA infections. RESULT CALLED TO, READ BACK BY AND VERIFIED WITH: EVERETTE,D RN 08/27/16 AT 0515 SKEEN,P   Aerobic/Anaerobic Culture (surgical/deep wound)     Status: None (Preliminary result)   Collection Time: 08/08/2016  4:01 PM  Result Value Ref Range Status   Specimen Description TISSUE BONE  Final   Special Requests SACRAL  Final   Gram Stain   Final    ABUNDANT WBC PRESENT, PREDOMINANTLY PMN ABUNDANT GRAM NEGATIVE RODS ABUNDANT GRAM POSITIVE COCCI IN PAIRS FEW GRAM POSITIVE RODS    Culture MODERATE GRAM NEGATIVE RODS  Final   Report Status PENDING  Incomplete    Studies/Results: Dg Chest Port 1 View  Result Date: 08/30/2016 CLINICAL DATA:  Respiratory failure.  Hypoxia. EXAM: PORTABLE CHEST 1 VIEW  COMPARISON:  One-view chest x-ray 08/28/2016 FINDINGS: Tracheostomy tube is in place. Right-sided PICC line is stable. The heart is enlarged. Diffuse interstitial and airspace disease is again seen. Bilateral pleural effusions have increased. IMPRESSION: 1. Cardiomegaly with increasing interstitial and airspace disease in bilateral pleural effusions compatible with congestive heart failure. 2. Increasing airspace disease likely reflects edema but infection is not excluded. 3. Support apparatus is stable. Electronically Signed   By: San Morelle M.D.   On: 08/30/2016 09:19   Dg Chest Port 1 View  Result Date: 09/04/2016 CLINICAL DATA:  Pneumonia EXAM: PORTABLE CHEST 1 VIEW COMPARISON:  Chest x-ray of 08/28/2016 FINDINGS: A tracheostomy is again noted. Right PICC line tip overlies the lower SVC near the expected SVC -RA junction. There is moderate cardiomegaly present and there does appear to be at least a right pleural effusion. There is pulmonary vascular congestion, findings most consistent with mild congestive heart failure. Superimposed pneumonia would be  difficult to exclude. IMPRESSION: 1. Suspect mild CHF with cardiomegaly, pulmonary vascular congestion, and small right pleural effusion. 2. Tracheostomy and right PICC line unchanged in position. Electronically Signed   By: [name]   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 ([place])   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([place])   History of pulmonary embolism   DVT (deep venous thrombosis) ([name])   Severe protein-calorie malnutrition ([name])   Dysphagia, pharyngoesophageal phase   Decubitus ulcer of right perineal ischial region, stage 4 ([name])   Decubitus ulcer of left perineal ischial region, stage 4 ([name])   Sacral decubitus ulcer, stage IV ([name])   Multiple allergies   Recurrent UTI   Respiratory failure, chronic ([name])   Aspiration pneumonia of right lower lobe ([name])   Seizures ([name])   Anxiety state   Anemia of chronic disease   Hyponatremia   Encephalopathy   Other acute osteomyelitis, multiple sites ([place])   Lower GI bleed    DA BEISEL is a 30 y.o. male with  very complicated medical history due to his paralysis and extensive decubitus ulcers. He came into the hospital with bleeding from his stoma site but also found to be with septic shock from viridans group streptococcal bacteremia. He has subsequent become encephalopathic. Antibiotics have been adjusted based on his multiple allergies. He is currently on Zyvox and tigecycline. Become obtunded in the last 48 hours despite removal of narcotics administration of Narcan and continued support  #1 viridans group streptococcal bacteremia:  He is easily covered for this with his Zyvox and tigecycline. I'm repeating blood cultures since he has persistently positive blood cultures. 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 Eusebio Friendly op note  #3 compression fracture with lucency: Could be osteomyelitis as well right now he is fairly broadly covered with his current regimen he is not in a status to have an aspirate from the site.  #4 encephalopathy with up tended status, extreme pupils and: Likely factorial. I worry that he  may have suffered anoxic brain injury potentially in the context of his current illness. His mother and father are certain that he will recover. He has certainly been in the hospital and in various ice use before when we never thought he would make it and he did survive. However at present his prognosis does not appear favorable.  #5 adrenal insufficiency on corticosteroids.  #6 Facial edema and upper extremity edema: I doubt these are due to an adverse drug reaction to the new antibiotics. I would not stop these antibiotics at this point in time. Edema more likely may be due to third spacing in the context of sepsis and volume resuscitation. 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    LOS: 4 days   [name] 08/30/2016, 12:23 PM

## 2016-08-30 NOTE — Consult Note (Signed)
Reason for Consult: coma Referring Physician: hospitalist  Jason [name] is an 30 y.o. male.  HPI: Patient admitted with sepsis.  Over the last 1-2d patient has become progressively less responsive.  The hx is obtained from the mother and chart review.  The mother thinks he became worse after starting on abx.  She states this has happened in the past; they have placed the patient on high dose steroids when starting abx.  This time it was not done and she thinks that's the reason.  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  He has a trach and his respiratory status has worsened and has been placed back on the vent.  Past Medical History:  Diagnosis Date  . Anemia   . Anxiety   . Arthritis   . Asthma   . Chronic osteomyelitis involving pelvic region and thigh ([place]) 06/18/2016  . Colostomy in place [facility])   . Complication of anesthesia    difficulty waking up, will have sudden drop in BP and O2 sats  . Depression   . Diabetic neuropathy ([place])   . Diabetic retinopathy ([place])   . Dysrhythmia    usually due to medications  . Family history of anesthesia complication    Pt mother can't have epidural procedures  . Fibromyalgia   . Gastroparesis   . GERD (gastroesophageal reflux disease)   . H/O constipation   . H/O respiratory failure   . H/O thrombosis   . Hx MRSA infection    on face  . Multiple allergies 11/02/2015  . Pneumonia   . Recurrent UTI 11/02/2015  . Seizures ([place]) 2017   one time during admission in hosptital  . Stroke ([place]) 01/29/2013   spinal stroke ; paraplegic   . Syncope 02/16/2015  . Tracheostomy present ([place])   . Type I diabetes mellitus ([place])    sees Dr. Loanne Drilling     Past Surgical History:  Procedure Laterality Date  . APPLICATION OF A-CELL OF BACK N/A 11/12/2014   Procedure: APPLICATION A CELL AND VAC ;  Surgeon: Theodoro Kos, DO;  Location: [place];  Service: Plastics;  Laterality: N/A;  . APPLICATION OF A-CELL OF BACK N/A 12/08/2014   Procedure: APPLICATION OF A-CELL AND WOUND VAC ;  Surgeon: Theodoro Kos, DO;  Location: [place];  Service: Plastics;  Laterality: N/A;  . APPLICATION OF A-CELL OF CHEST/ABDOMEN N/A 08/04/2015   Procedure: APPLICATION OF A-CELL ;  Surgeon: Loel Lofty Dillingham, DO;  Location: [place];  Service: Plastics;  Laterality: N/A;  . COLOSTOMY    . DEBRIDMENT OF DECUBITUS ULCER N/A 10/04/2014   Procedure: DEBRIDMENT OF DECUBITUS ULCER;  Surgeon: Georganna Skeans, MD;  Location: [place];  Service: General;  Laterality: N/A;  . GASTROSTOMY TUBE PLACEMENT    . HEMATOMA EVACUATION N/A 05/05/2015   Procedure: EVACUATION HEMATOMA bedside procedure;  Surgeon: Theodoro Kos, DO;  Location: [place];  Service: Plastics;  Laterality: N/A;  . INCISION AND DRAINAGE OF WOUND N/A 11/12/2014   Procedure: IRRIGATION AND DEBRIDEMENT OF WOUNDS WITH BONE BIOPSY AND SURGICAL PREP ;  Surgeon: Theodoro Kos, DO;  Location: [place];  Service: Plastics;  Laterality: N/A;  . INCISION AND DRAINAGE OF WOUND N/A 11/18/2014   Procedure: IRRIGATION AND DEBRIDEMENT OF SACRAL ULCER ONLY WITH PLACEMENT OF A CELL AND VAC/ DRESSING CHANGE TO UPPER BACK AREA.;  Surgeon: Theodoro Kos, DO;  Location: [place];  Service: Plastics;  Laterality: N/A;  . INCISION AND DRAINAGE OF WOUND N/A 11/25/2014  Procedure: IRRIGATION AND DEBRIDEMENT OF SACRAL ULCER AND BACK BURN WITH PLACEMENT OF A-CELL;  Surgeon: Theodoro Kos, DO;  Location: [place];  Service: Plastics;  Laterality: N/A;  . INCISION AND DRAINAGE OF WOUND Right 12/08/2014   Procedure: IRRIGATION AND DEBRIDEMENT SACRAL WOUND AND RIGHT ISCHIAL WOUND ;  Surgeon: Theodoro Kos, DO;  Location: [place];  Service: Plastics;  Laterality: Right;  . INCISION AND DRAINAGE OF WOUND Bilateral 05/05/2015   Procedure: IRRIGATION AND DEBRIDEMENT SACRAL ULCER;  Surgeon: Theodoro Kos, DO;  Location: [place];  Service: Plastics;  Laterality: Bilateral;  . INCISION AND DRAINAGE OF WOUND N/A 08/04/2015   Procedure: IRRIGATION AND  DEBRIDEMENT OF SACRAL ULCER AND ;  Surgeon: Loel Lofty Dillingham, DO;  Location: [place];  Service: Plastics;  Laterality: N/A;  . INCISION AND DRAINAGE OF WOUND N/A 08/23/2016   Procedure: IRRIGATION AND DEBRIDEMENT SACRAL ULCER;  Surgeon: Loel Lofty Dillingham, DO;  Location: [place];  Service: Plastics;  Laterality: N/A;  [place]  . INSERTION OF SUPRAPUBIC CATHETER N/A 10/12/2014   Procedure: INSERTION OF SUPRAPUBIC CATHETER;  Surgeon: Reece Packer, MD;  Location: [place];  Service: Urology;  Laterality: N/A;  . IR GENERIC HISTORICAL  07/11/2016   IR CM INJ ANY COLONIC TUBE W/FLUORO 07/11/2016 Arne Cleveland, MD [facility]-INTERV RAD  . LAPAROSCOPIC DIVERTED COLOSTOMY N/A 10/12/2014   Procedure: LAPAROSCOPIC DIVERTING COLOSTOMY;  Surgeon: Donnie Mesa, MD;  Location: [place];  Service: General;  Laterality: N/A;  . MINOR APPLICATION OF WOUND VAC N/A 11/25/2014   Procedure:  WOUND VAC CHANGE;  Surgeon: Theodoro Kos, DO;  Location: [place];  Service: Plastics;  Laterality: N/A;  . MULTIPLE EXTRACTIONS WITH ALVEOLOPLASTY N/A 08/03/2014   Procedure: MULTIPLE EXTRACTIONS;  Surgeon: Gae Bon, DDS;  Location: [place];  Service: Oral Surgery;  Laterality: N/A;  . RADIOLOGY WITH ANESTHESIA N/A 07/05/2016   Procedure: RADIOLOGY WITH ANESTHESIA MRI OF PELVIS WITH AND WITHOUT;  Surgeon: Medication Radiologist, MD;  Location: [place];  Service: Radiology;  Laterality: N/A;  . TEE WITHOUT CARDIOVERSION N/A 08/17/2014   Procedure: TRANSESOPHAGEAL ECHOCARDIOGRAM (TEE);  Surgeon: Dorothy Spark, MD;  Location: [place];  Service: Cardiovascular;  Laterality: N/A;  . TEE WITHOUT CARDIOVERSION N/A 05/05/2015   Procedure: TRANSESOPHAGEAL ECHOCARDIOGRAM (TEE);  Surgeon: Lelon Perla, MD;  Location: [facility] ENDOSCOPY;  Service: Cardiovascular;  Laterality: N/A;  . TONSILLECTOMY      Family History  Problem Relation Age of Onset  . Diabetes Father   . Hypertension Father   . Heart disease Mother     congenital - MVP  .  Asthma      fhx  . Hypertension      fhx  . Stroke      fhx    Social History:  reports that he quit smoking about 1 years ago. His smoking use included Cigars and Cigarettes. He has a 12.00 pack-year smoking history. He has never used smokeless tobacco. He reports that he does not drink alcohol or use drugs.  Allergies:  Allergies  Allergen Reactions  . Amikacin Sulfate Other (See Comments)    Seizure   . Cefuroxime Axetil Anaphylaxis    Tolerated cefepime 12/2015 - with Pepcid/Solu-Medrol  . Ertapenem Other (See Comments)    Rash and confusion-->tolerated Imipenem   . Morphine And Related Other (See Comments)    Changed mental status, confusion, headache, visual hallucination  . Nsaids Itching and Other (See Comments)    Risk of bleeding, itching  . Penicillins Anaphylaxis  and Other (See Comments)    Tolerated Imipenem; no reaction to 7 day course of amoxicillin in 2015 Has patient had a PCN reaction causing immediate rash, facial/tongue/throat swelling, SOB or lightheadedness with hypotension: Yes Has patient had a PCN reaction causing severe rash involving mucus membranes or skin necrosis: No Has patient had a PCN reaction that required hospitalization Yes Has patient had a PCN reaction occurring within the last 10 years: No If all of the above answers are "NO", then may proceed with Cephalo  . Sulfa Antibiotics Anaphylaxis, Shortness Of Breath and Other (See Comments)  . Tessalon [Benzonatate] Anaphylaxis  . Aztreonam Swelling    He appears to tolerate if you premedicate with steroids. Had facial/neck swelling without premedication  . Levaquin [Levofloxacin In D5w] Swelling    Hand and forearm swelling  . Rocephin [Ceftriaxone] Other (See Comments)    Confusion  . Shellfish Allergy Itching and Other (See Comments)    Took benadryl to alleviate reaction  . Miripirium Rash and Other (See Comments)    Change in mental status    Medications: I have reviewed the patient's  current medications.  Results for orders placed or performed during the hospital encounter of 08/17/2016 (from the past 48 hour(s))  Glucose, capillary     Status: Abnormal   Collection Time: 08/28/16  5:13 PM  Result Value Ref Range   Glucose-Capillary 179 (H) 65 - 99 mg/dL   Comment 1 Notify RN    Comment 2 Document in Chart   Glucose, capillary     Status: Abnormal   Collection Time: 08/28/16  9:13 PM  Result Value Ref Range   Glucose-Capillary 168 (H) 65 - 99 mg/dL  Glucose, capillary     Status: Abnormal   Collection Time: 09/04/2016  3:51 AM  Result Value Ref Range   Glucose-Capillary 209 (H) 65 - 99 mg/dL  CBC with Differential/Platelet     Status: Abnormal   Collection Time: 08/14/2016  6:15 AM  Result Value Ref Range   WBC 34.1 (H) 4.0 - 10.5 K/uL    Comment: REPEATED TO VERIFY   RBC 4.06 (L) 4.22 - 5.81 MIL/uL   Hemoglobin 11.0 (L) 13.0 - 17.0 g/dL   HCT 33.2 (L) 39.0 - 52.0 %   MCV 81.8 78.0 - 100.0 fL   MCH 27.1 26.0 - 34.0 pg   MCHC 33.1 30.0 - 36.0 g/dL   RDW 17.2 (H) 11.5 - 15.5 %   Platelets 351 150 - 400 K/uL   Neutrophils Relative % 92 %   Lymphocytes Relative 4 %   Monocytes Relative 4 %   Eosinophils Relative 0 %   Basophils Relative 0 %   Neutro Abs 31.3 (H) 1.7 - 7.7 K/uL   Lymphs Abs 1.4 0.7 - 4.0 K/uL   Monocytes Absolute 1.4 (H) 0.1 - 1.0 K/uL   Eosinophils Absolute 0.0 0.0 - 0.7 K/uL   Basophils Absolute 0.0 0.0 - 0.1 K/uL   WBC Morphology MILD LEFT SHIFT (1-5% METAS, OCC MYELO, OCC BANDS)   Comprehensive metabolic panel     Status: Abnormal   Collection Time: 08/15/2016  6:15 AM  Result Value Ref Range   Sodium 129 (L) 135 - 145 mmol/L   Potassium 4.8 3.5 - 5.1 mmol/L   Chloride 107 101 - 111 mmol/L   CO2 16 (L) 22 - 32 mmol/L   Glucose, Bld 244 (H) 65 - 99 mg/dL   BUN 42 (H) 6 - 20 mg/dL   Creatinine, Ser  1.23 0.61 - 1.24 mg/dL   Calcium 8.0 (L) 8.9 - 10.3 mg/dL   Total Protein 5.4 (L) 6.5 - 8.1 g/dL   Albumin 1.5 (L) 3.5 - 5.0 g/dL   AST 11  (L) 15 - 41 U/L   ALT 12 (L) 17 - 63 U/L   Alkaline Phosphatase 116 38 - 126 U/L   Total Bilirubin 0.3 0.3 - 1.2 mg/dL   GFR calc non Af Amer >60 >60 mL/min   GFR calc Af Amer >60 >60 mL/min    Comment: (NOTE) The eGFR has been calculated using the CKD EPI equation. This calculation has not been validated in all clinical situations. eGFR's persistently <60 mL/min signify possible Chronic Kidney Disease.    Anion gap 6 5 - 15  Magnesium     Status: None   Collection Time: 08/10/2016  6:15 AM  Result Value Ref Range   Magnesium 1.8 1.7 - 2.4 mg/dL  Phosphorus     Status: None   Collection Time: 08/14/2016  6:15 AM  Result Value Ref Range   Phosphorus 4.0 2.5 - 4.6 mg/dL  Glucose, capillary     Status: Abnormal   Collection Time: 08/15/2016  8:39 AM  Result Value Ref Range   Glucose-Capillary 223 (H) 65 - 99 mg/dL   Comment 1 Notify RN    Comment 2 Document in Chart   Blood gas, arterial     Status: Abnormal   Collection Time: 08/31/2016  9:45 AM  Result Value Ref Range   FIO2 0.28    Delivery systems TRACH COLLAR/TRACH TUBE    pH, Arterial 7.234 (L) 7.350 - 7.450   pCO2 arterial 35.3 32.0 - 48.0 mmHg   pO2, Arterial 90.7 83.0 - 108.0 mmHg   Bicarbonate 14.4 (L) 20.0 - 28.0 mmol/L   Acid-base deficit 11.6 (H) 0.0 - 2.0 mmol/L   O2 Saturation 96.8 %   Patient temperature 98.6    Collection site LEFT RADIAL    Drawn by [phone]    Sample type ARTERIAL DRAW    Allens test (pass/fail) PASS PASS  Ammonia     Status: Abnormal   Collection Time: 08/14/2016 10:55 AM  Result Value Ref Range   Ammonia 39 (H) 9 - 35 umol/L  Lactic acid, plasma     Status: None   Collection Time: 08/23/2016 10:55 AM  Result Value Ref Range   Lactic Acid, Venous 1.5 0.5 - 1.9 mmol/L  Glucose, capillary     Status: Abnormal   Collection Time: 08/18/2016  1:10 PM  Result Value Ref Range   Glucose-Capillary 254 (H) 65 - 99 mg/dL   Comment 1 Notify RN    Comment 2 Document in Chart   Glucose, capillary      Status: Abnormal   Collection Time: 08/27/2016  3:00 PM  Result Value Ref Range   Glucose-Capillary 216 (H) 65 - 99 mg/dL  Aerobic/Anaerobic Culture (surgical/deep wound)     Status: None (Preliminary result)   Collection Time: 08/25/2016  4:01 PM  Result Value Ref Range   Specimen Description TISSUE BONE    Special Requests SACRAL    Gram Stain      ABUNDANT WBC PRESENT, PREDOMINANTLY PMN ABUNDANT GRAM NEGATIVE RODS ABUNDANT GRAM POSITIVE COCCI IN PAIRS FEW GRAM POSITIVE RODS    Culture MODERATE GRAM NEGATIVE RODS    Report Status PENDING   Glucose, capillary     Status: Abnormal   Collection Time: 08/09/2016  4:21 PM  Result Value Ref Range  Glucose-Capillary 176 (H) 65 - 99 mg/dL  Glucose, capillary     Status: Abnormal   Collection Time: 08/08/2016  5:27 PM  Result Value Ref Range   Glucose-Capillary 179 (H) 65 - 99 mg/dL  Glucose, capillary     Status: Abnormal   Collection Time: 08/15/2016  7:57 PM  Result Value Ref Range   Glucose-Capillary 178 (H) 65 - 99 mg/dL  Glucose, capillary     Status: Abnormal   Collection Time: 08/30/16 12:55 AM  Result Value Ref Range   Glucose-Capillary 119 (H) 65 - 99 mg/dL  Glucose, capillary     Status: Abnormal   Collection Time: 08/30/16  3:50 AM  Result Value Ref Range   Glucose-Capillary 128 (H) 65 - 99 mg/dL  Procalcitonin     Status: None   Collection Time: 08/30/16  5:00 AM  Result Value Ref Range   Procalcitonin 0.57 ng/mL    Comment:        Interpretation: PCT > 0.5 ng/mL and <= 2 ng/mL: Systemic infection (sepsis) is possible, but other conditions are known to elevate PCT as well. (NOTE)         ICU PCT Algorithm               Non ICU PCT Algorithm    ----------------------------     ------------------------------         PCT < 0.25 ng/mL                 PCT < 0.1 ng/mL     Stopping of antibiotics            Stopping of antibiotics       strongly encouraged.               strongly encouraged.    ----------------------------      ------------------------------       PCT level decrease by               PCT < 0.25 ng/mL       >= 80% from peak PCT       OR PCT 0.25 - 0.5 ng/mL          Stopping of antibiotics                                             encouraged.     Stopping of antibiotics           encouraged.    ----------------------------     ------------------------------       PCT level decrease by              PCT >= 0.25 ng/mL       < 80% from peak PCT        AND PCT >= 0.5 ng/mL             Continuing antibiotics                                              encouraged.       Continuing antibiotics            encouraged.    ----------------------------     ------------------------------     PCT level increase compared  PCT > 0.5 ng/mL         with peak PCT AND          PCT >= 0.5 ng/mL             Escalation of antibiotics                                          strongly encouraged.      Escalation of antibiotics        strongly encouraged.   CBC     Status: Abnormal   Collection Time: 08/30/16  5:00 AM  Result Value Ref Range   WBC 52.9 (HH) 4.0 - 10.5 K/uL    Comment: REPEATED TO VERIFY WHITE COUNT CONFIRMED ON SMEAR CRITICAL RESULT CALLED TO, READ BACK BY AND VERIFIED WITH: M.SLAUGHTER,RN [id number] 08/30/16 M.CAMPBELL    RBC 4.01 (L) 4.22 - 5.81 MIL/uL   Hemoglobin 10.9 (L) 13.0 - 17.0 g/dL   HCT 33.2 (L) 39.0 - 52.0 %   MCV 82.8 78.0 - 100.0 fL   MCH 27.2 26.0 - 34.0 pg   MCHC 32.8 30.0 - 36.0 g/dL   RDW 17.8 (H) 11.5 - 15.5 %   Platelets 305 150 - 400 K/uL  Comprehensive metabolic panel     Status: Abnormal   Collection Time: 08/30/16  5:00 AM  Result Value Ref Range   Sodium 133 (L) 135 - 145 mmol/L   Potassium 4.4 3.5 - 5.1 mmol/L   Chloride 112 (H) 101 - 111 mmol/L   CO2 16 (L) 22 - 32 mmol/L   Glucose, Bld 128 (H) 65 - 99 mg/dL   BUN 47 (H) 6 - 20 mg/dL   Creatinine, Ser 1.33 (H) 0.61 - 1.24 mg/dL   Calcium 8.1 (L) 8.9 - 10.3 mg/dL   Total Protein 5.1 (L) 6.5 - 8.1  g/dL   Albumin 1.5 (L) 3.5 - 5.0 g/dL   AST 15 15 - 41 U/L   ALT 12 (L) 17 - 63 U/L   Alkaline Phosphatase 124 38 - 126 U/L   Total Bilirubin 0.5 0.3 - 1.2 mg/dL   GFR calc non Af Amer >60 >60 mL/min   GFR calc Af Amer >60 >60 mL/min    Comment: (NOTE) The eGFR has been calculated using the CKD EPI equation. This calculation has not been validated in all clinical situations. eGFR's persistently <60 mL/min signify possible Chronic Kidney Disease.    Anion gap 5 5 - 15  Glucose, capillary     Status: Abnormal   Collection Time: 08/30/16  8:00 AM  Result Value Ref Range   Glucose-Capillary 124 (H) 65 - 99 mg/dL   Comment 1 Notify RN    Comment 2 Document in Chart   Blood gas, arterial     Status: Abnormal   Collection Time: 08/30/16  9:00 AM  Result Value Ref Range   FIO2 28.00    Delivery systems TRACH COLLAR/TRACH TUBE    pH, Arterial 7.311 (L) 7.350 - 7.450   pCO2 arterial 30.3 (L) 32.0 - 48.0 mmHg   pO2, Arterial 43.9 (L) 83.0 - 108.0 mmHg   Bicarbonate 14.8 (L) 20.0 - 28.0 mmol/L   Acid-base deficit 10.2 (H) 0.0 - 2.0 mmol/L   O2 Saturation 79.9 %   Patient temperature 99.3    Collection site LEFT RADIAL    Drawn by [phone]  Sample type ARTERIAL DRAW    Allens test (pass/fail) PASS PASS  Glucose, capillary     Status: Abnormal   Collection Time: 08/30/16 11:52 AM  Result Value Ref Range   Glucose-Capillary 184 (H) 65 - 99 mg/dL   Comment 1 Notify RN    Comment 2 Document in Chart   Blood gas, arterial     Status: Abnormal   Collection Time: 08/30/16 12:45 PM  Result Value Ref Range   FIO2 100.00    Delivery systems VENTILATOR    Mode PRESSURE REGULATED VOLUME CONTROL    VT 550 mL   LHR 14 resp/min   Peep/cpap 5.0 cm H20   pH, Arterial 7.315 (L) 7.350 - 7.450   pCO2 arterial 28.1 (L) 32.0 - 48.0 mmHg   pO2, Arterial 209 (H) 83.0 - 108.0 mmHg   Bicarbonate 13.9 (L) 20.0 - 28.0 mmol/L   Acid-base deficit 11.1 (H) 0.0 - 2.0 mmol/L   O2 Saturation 99.3 %    Patient temperature 98.6    Collection site RIGHT RADIAL    Drawn by [phone]    Sample type ARTERIAL DRAW    Allens test (pass/fail) PASS PASS   *Note: Due to a large number of results and/or encounters for the requested time period, some results have not been displayed. A complete set of results can be found in Results Review.    Dg Chest Port 1 View  Result Date: 08/30/2016 CLINICAL DATA:  Respiratory failure.  Hypoxia. EXAM: PORTABLE CHEST 1 VIEW COMPARISON:  One-view chest x-ray 08/16/2016 FINDINGS: Tracheostomy tube is in place. Right-sided PICC line is stable. The heart is enlarged. Diffuse interstitial and airspace disease is again seen. Bilateral pleural effusions have increased. IMPRESSION: 1. Cardiomegaly with increasing interstitial and airspace disease in bilateral pleural effusions compatible with congestive heart failure. 2. Increasing airspace disease likely reflects edema but infection is not excluded. 3. Support apparatus is stable. Electronically Signed   By: San Morelle M.D.   On: 08/30/2016 09:19   Dg Chest Port 1 View  Result Date: 09/06/2016 CLINICAL DATA:  Pneumonia EXAM: PORTABLE CHEST 1 VIEW COMPARISON:  Chest x-ray of 08/28/2016 FINDINGS: A tracheostomy is again noted. Right PICC line tip overlies the lower SVC near the expected SVC -RA junction. There is moderate cardiomegaly present and there does appear to be at least a right pleural effusion. There is pulmonary vascular congestion, findings most consistent with mild congestive heart failure. Superimposed pneumonia would be difficult to exclude. IMPRESSION: 1. Suspect mild CHF with cardiomegaly, pulmonary vascular congestion, and small right pleural effusion. 2. Tracheostomy and right PICC line unchanged in position. Electronically Signed   By: [name]   On: 09/04/2016 10:15    Review of Systems  Unable to perform ROS: Critical illness     Blood pressure 122/82, pulse (!) 109, temperature 98.6 F  (37 C), temperature source Axillary, resp. rate 13, height 5' 8"  (1.727 m), weight 54.9 kg (121 lb), SpO2 100 %. Physical Exam MS - eyes close, no response to any stimuli CN - pupils mid-position with no obvious response; no corneals; no gag; no doll's eyes; patient is breathing above the vent Motor - movement to pain Sensory - no response to pain DTR - symmetric Coor/station/gait - not testable  Assessment/Plan: 1. Coma Etiology is likely related to encephalopathy from severe sepsis though concern for anoxic brain injury.  CT head from 2d ago unremarkable.  It would still be important to exclude subtle structural abnormalities so  an MRI brian is essential.  Would also get an EEG to exclude non-convulsive status.  No indication for LP at this time as patient already on extensive abx and source is known.  Overall, prognosis is guarded at this time.  2. Prior Spinal cord infarct with partial quadriparesis  3. Sepsis  4. Mult other med probs  Doren Custard, MD Neurology 08/30/2016, 2:35 PM

## 2016-08-30 NOTE — Progress Notes (Signed)
eLink Physician-Brief Progress Note Patient Name: Jason [name] DOB: Feb 23, 1986 MRN: [id number]   Date of Service  08/30/2016  HPI/Events of Note  Hypotension with BP of 67/50 (56).  On midodrine, on no pressors, and is net negative due after lasix x 1.  No sure what baseline BP is at home but suspect hypotension given midodrine dosing.  eICU Interventions  Plan: 1 time bolus of 250 cc NS IV     Intervention Category Intermediate Interventions: Hypotension - evaluation and management  DETERDING,ELIZABETH 08/30/2016, 11:30 PM

## 2016-08-30 NOTE — Significant Event (Signed)
Rapid Response Event Note  Overview: Time Called: 0943 Arrival Time: [id number] Event Type: Respiratory  Initial Focused Assessment: Patient with labored breathing,  Dyssynchronous abdominal breathing. BP 141/85  ST 105  RR 26  O2 sat 88-93% on 28% TC Not Responsive to stimuli,   Lung sounds Rhonchi Heart tones regular  Interventions: RT placing patient on vent Rate 14  Fio2 100% Peep 5 Suction tan sceretions After about 50min he became more synchronous with ventilator. 10:30am   BP 120/81  SR 96  RR 16 O2 sat 100% Continued to monitor patient. ABG 7.31/28/209/11 Weaned O2 to 60% fio2  See Flowsheet for VS About 2pm patient beginning to awake, moving his left arm and interacting with his mother. Report called to 38M by Matt 2:15pm  Transported to 38M via bed with O2 and heart monitor Tiffany Kocher at bedside to assess patient   Plan of Care (if not transferred):  Event Summary: Name of Physician Notified: Prior to my arrival:  Hongalgi at bedside to assess patient at    Name of Consulting Physician Notified: CCM:  Corrie Dandy at    Outcome: Transferred (Comment)     Raliegh Ip

## 2016-08-31 ENCOUNTER — Inpatient Hospital Stay (HOSPITAL_COMMUNITY): Payer: Medicaid Other

## 2016-08-31 DIAGNOSIS — A419 Sepsis, unspecified organism: Secondary | ICD-10-CM

## 2016-08-31 DIAGNOSIS — M86659 Other chronic osteomyelitis, unspecified thigh: Secondary | ICD-10-CM

## 2016-08-31 DIAGNOSIS — A408 Other streptococcal sepsis: Secondary | ICD-10-CM

## 2016-08-31 DIAGNOSIS — T782XXA Anaphylactic shock, unspecified, initial encounter: Secondary | ICD-10-CM

## 2016-08-31 DIAGNOSIS — R6521 Severe sepsis with septic shock: Secondary | ICD-10-CM

## 2016-08-31 DIAGNOSIS — Z515 Encounter for palliative care: Secondary | ICD-10-CM

## 2016-08-31 DIAGNOSIS — N179 Acute kidney failure, unspecified: Secondary | ICD-10-CM

## 2016-08-31 DIAGNOSIS — E872 Acidosis: Secondary | ICD-10-CM

## 2016-08-31 DIAGNOSIS — J189 Pneumonia, unspecified organism: Secondary | ICD-10-CM

## 2016-08-31 DIAGNOSIS — Z1612 Extended spectrum beta lactamase (ESBL) resistance: Secondary | ICD-10-CM

## 2016-08-31 LAB — CBC
HEMATOCRIT: 27.1 % — AB (ref 39.0–52.0)
HEMATOCRIT: 30.8 % — AB (ref 39.0–52.0)
HEMATOCRIT: 32.8 % — AB (ref 39.0–52.0)
HEMOGLOBIN: 10.2 g/dL — AB (ref 13.0–17.0)
HEMOGLOBIN: 10.7 g/dL — AB (ref 13.0–17.0)
HEMOGLOBIN: 9.2 g/dL — AB (ref 13.0–17.0)
MCH: 27 pg (ref 26.0–34.0)
MCH: 27 pg (ref 26.0–34.0)
MCH: 27.3 pg (ref 26.0–34.0)
MCHC: 32.6 g/dL (ref 30.0–36.0)
MCHC: 33.1 g/dL (ref 30.0–36.0)
MCHC: 33.9 g/dL (ref 30.0–36.0)
MCV: 79.5 fL (ref 78.0–100.0)
MCV: 82.6 fL (ref 78.0–100.0)
MCV: 82.6 fL (ref 78.0–100.0)
Platelets: 121 10*3/uL — ABNORMAL LOW (ref 150–400)
Platelets: 145 10*3/uL — ABNORMAL LOW (ref 150–400)
Platelets: 197 10*3/uL (ref 150–400)
RBC: 3.41 MIL/uL — ABNORMAL LOW (ref 4.22–5.81)
RBC: 3.73 MIL/uL — AB (ref 4.22–5.81)
RBC: 3.97 MIL/uL — ABNORMAL LOW (ref 4.22–5.81)
RDW: 17.1 % — ABNORMAL HIGH (ref 11.5–15.5)
RDW: 17.3 % — AB (ref 11.5–15.5)
RDW: 17.3 % — AB (ref 11.5–15.5)
WBC: 44.3 10*3/uL — ABNORMAL HIGH (ref 4.0–10.5)
WBC: 49.3 10*3/uL — ABNORMAL HIGH (ref 4.0–10.5)
WBC: 80.8 10*3/uL — AB (ref 4.0–10.5)

## 2016-08-31 LAB — GLUCOSE, CAPILLARY
GLUCOSE-CAPILLARY: 103 mg/dL — AB (ref 65–99)
GLUCOSE-CAPILLARY: 193 mg/dL — AB (ref 65–99)
GLUCOSE-CAPILLARY: 81 mg/dL (ref 65–99)
Glucose-Capillary: 105 mg/dL — ABNORMAL HIGH (ref 65–99)
Glucose-Capillary: 138 mg/dL — ABNORMAL HIGH (ref 65–99)
Glucose-Capillary: 147 mg/dL — ABNORMAL HIGH (ref 65–99)
Glucose-Capillary: 174 mg/dL — ABNORMAL HIGH (ref 65–99)
Glucose-Capillary: 44 mg/dL — CL (ref 65–99)

## 2016-08-31 LAB — BASIC METABOLIC PANEL
ANION GAP: 8 (ref 5–15)
Anion gap: 7 (ref 5–15)
BUN: 43 mg/dL — ABNORMAL HIGH (ref 6–20)
BUN: 38 mg/dL — ABNORMAL HIGH (ref 6–20)
CALCIUM: 7.4 mg/dL — AB (ref 8.9–10.3)
CALCIUM: 7.3 mg/dL — AB (ref 8.9–10.3)
CO2: 20 mmol/L — AB (ref 22–32)
CO2: 23 mmol/L (ref 22–32)
CREATININE: 1.03 mg/dL (ref 0.61–1.24)
Chloride: 112 mmol/L — ABNORMAL HIGH (ref 101–111)
Chloride: 111 mmol/L (ref 101–111)
Creatinine, Ser: 1.24 mg/dL (ref 0.61–1.24)
GFR calc Af Amer: >60 mL/min (ref >60)
GFR calc non Af Amer: >60 mL/min (ref >60)
GFR calc non Af Amer: >60 mL/min (ref >60)
GLUCOSE: 98 mg/dL (ref 65–99)
GLUCOSE: 138 mg/dL — AB (ref 65–99)
POTASSIUM: 3.2 mmol/L — AB (ref 3.5–5.1)
Potassium: 3.8 mmol/L (ref 3.5–5.1)
Sodium: 140 mmol/L (ref 135–145)
Sodium: 141 mmol/L (ref 135–145)

## 2016-08-31 LAB — COMPREHENSIVE METABOLIC PANEL
ALBUMIN: 1.2 g/dL — AB (ref 3.5–5.0)
ALK PHOS: 132 U/L — AB (ref 38–126)
ALT: 9 U/L — ABNORMAL LOW (ref 17–63)
ANION GAP: 6 (ref 5–15)
AST: 26 U/L (ref 15–41)
BILIRUBIN TOTAL: 0.1 mg/dL — AB (ref 0.3–1.2)
BUN: 44 mg/dL — AB (ref 6–20)
CALCIUM: 7.7 mg/dL — AB (ref 8.9–10.3)
CO2: 14 mmol/L — AB (ref 22–32)
Chloride: 115 mmol/L — ABNORMAL HIGH (ref 101–111)
Creatinine, Ser: 1.28 mg/dL — ABNORMAL HIGH (ref 0.61–1.24)
GFR calc Af Amer: >60 mL/min (ref >60)
GFR calc non Af Amer: >60 mL/min (ref >60)
GLUCOSE: 104 mg/dL — AB (ref 65–99)
POTASSIUM: 3.7 mmol/L (ref 3.5–5.1)
Sodium: 135 mmol/L (ref 135–145)
Total Protein: 3.7 g/dL — ABNORMAL LOW (ref 6.5–8.1)

## 2016-08-31 LAB — POCT I-STAT 3, ART BLOOD GAS (G3+)
ACID-BASE DEFICIT: 14 mmol/L — AB (ref 0.0–2.0)
ACID-BASE DEFICIT: 7 mmol/L — AB (ref 0.0–2.0)
Bicarbonate: 13.4 mmol/L — ABNORMAL LOW (ref 20.0–28.0)
Bicarbonate: 17.8 mmol/L — ABNORMAL LOW (ref 20.0–28.0)
O2 Saturation: 74 %
O2 Saturation: 86 %
PH ART: 7.347 — AB (ref 7.350–7.450)
PO2 ART: 48 mmHg — AB (ref 83.0–108.0)
TCO2: 14 mmol/L (ref 0–100)
TCO2: 19 mmol/L (ref 0–100)
pCO2 arterial: 34.8 mmHg (ref 32.0–48.0)
pCO2 arterial: 32.5 mmHg (ref 32.0–48.0)
pH, Arterial: 7.195 — CL (ref 7.350–7.450)
pO2, Arterial: 53 mmHg — ABNORMAL LOW (ref 83.0–108.0)

## 2016-08-31 LAB — CULTURE, BLOOD (ROUTINE X 2)

## 2016-08-31 LAB — LACTIC ACID, PLASMA
Lactic Acid, Venous: 3.9 mmol/L (ref 0.5–1.9)
Lactic Acid, Venous: 3.1 mmol/L (ref 0.5–1.9)
Lactic Acid, Venous: 3.5 mmol/L (ref 0.5–1.9)
Lactic Acid, Venous: 3.3 mmol/L (ref 0.5–1.9)

## 2016-08-31 MED ORDER — IMIPENEM-CILASTATIN 500 MG IV SOLR
500.0000 mg | Freq: Three times a day (TID) | INTRAVENOUS | Status: DC
  Administered 2016-08-31 – 2016-09-08 (×24): 500 mg via INTRAVENOUS
  Filled 2016-08-31 (×26): qty 500

## 2016-08-31 MED ORDER — IPRATROPIUM BROMIDE 0.02 % IN SOLN
0.5000 mg | Freq: Four times a day (QID) | RESPIRATORY_TRACT | Status: DC
  Administered 2016-08-31 – 2016-09-05 (×22): 0.5 mg via RESPIRATORY_TRACT
  Filled 2016-08-31 (×22): qty 2.5

## 2016-08-31 MED ORDER — DEXAMETHASONE SODIUM PHOSPHATE 4 MG/ML IJ SOLN
4.0000 mg | Freq: Three times a day (TID) | INTRAMUSCULAR | Status: DC
  Administered 2016-08-31 – 2016-09-08 (×24): 4 mg via INTRAVENOUS
  Filled 2016-08-31 (×24): qty 1

## 2016-08-31 MED ORDER — PANTOPRAZOLE SODIUM 40 MG PO PACK
40.0000 mg | PACK | Freq: Every day | ORAL | Status: DC
  Administered 2016-08-31 – 2016-09-02 (×3): 40 mg
  Filled 2016-08-31 (×4): qty 20

## 2016-08-31 MED ORDER — SODIUM CHLORIDE 0.9 % IV BOLUS (SEPSIS)
500.0000 mL | Freq: Once | INTRAVENOUS | Status: AC
  Administered 2016-08-31: 500 mL via INTRAVENOUS

## 2016-08-31 MED ORDER — SODIUM BICARBONATE 8.4 % IV SOLN
100.0000 meq | Freq: Once | INTRAVENOUS | Status: AC
  Administered 2016-08-31: 100 meq via INTRAVENOUS
  Filled 2016-08-31: qty 100

## 2016-08-31 MED ORDER — SODIUM BICARBONATE 8.4 % IV SOLN
INTRAVENOUS | Status: DC
  Administered 2016-08-31: 08:00:00 via INTRAVENOUS
  Filled 2016-08-31 (×2): qty 150

## 2016-08-31 MED ORDER — NOREPINEPHRINE BITARTRATE 1 MG/ML IV SOLN
2.0000 ug/min | INTRAVENOUS | Status: DC
  Administered 2016-08-31: 5 ug/min via INTRAVENOUS
  Filled 2016-08-31 (×2): qty 4

## 2016-08-31 MED ORDER — SODIUM CHLORIDE 0.9 % IV SOLN
100.0000 mg | INTRAVENOUS | Status: DC
  Administered 2016-09-01 – 2016-09-02 (×2): 100 mg via INTRAVENOUS
  Filled 2016-08-31 (×2): qty 100

## 2016-08-31 MED ORDER — SODIUM BICARBONATE 8.4 % IV SOLN
100.0000 meq | Freq: Once | INTRAVENOUS | Status: AC
Start: 2016-08-31 — End: 2016-08-31
  Administered 2016-08-31: 100 meq via INTRAVENOUS
  Filled 2016-08-31: qty 100

## 2016-08-31 MED ORDER — DEXTROSE 50 % IV SOLN
INTRAVENOUS | Status: AC
  Administered 2016-08-31: 25 mL
  Filled 2016-08-31: qty 50

## 2016-08-31 MED ORDER — SODIUM CHLORIDE 0.9 % IV BOLUS (SEPSIS)
250.0000 mL | Freq: Once | INTRAVENOUS | Status: AC
  Administered 2016-08-31: 250 mL via INTRAVENOUS

## 2016-08-31 MED ORDER — HYDROCORTISONE NA SUCCINATE PF 100 MG IJ SOLR
50.0000 mg | Freq: Four times a day (QID) | INTRAMUSCULAR | Status: DC
  Administered 2016-08-31 (×2): 50 mg via INTRAVENOUS
  Filled 2016-08-31: qty 1
  Filled 2016-08-31: qty 2
  Filled 2016-08-31: qty 1

## 2016-08-31 MED ORDER — ALBUMIN HUMAN 25 % IV SOLN
12.5000 g | Freq: Once | INTRAVENOUS | Status: AC
  Administered 2016-08-31: 12.5 g via INTRAVENOUS
  Filled 2016-08-31: qty 50

## 2016-08-31 MED ORDER — SODIUM CHLORIDE 0.9 % IV SOLN
200.0000 mg | Freq: Once | INTRAVENOUS | Status: AC
  Administered 2016-08-31: 200 mg via INTRAVENOUS
  Filled 2016-08-31 (×2): qty 200

## 2016-08-31 MED ORDER — FENTANYL CITRATE (PF) 100 MCG/2ML IJ SOLN
25.0000 ug | INTRAMUSCULAR | Status: DC | PRN
  Administered 2016-08-31 – 2016-09-10 (×59): 100 ug via INTRAVENOUS
  Filled 2016-08-31 (×60): qty 2

## 2016-08-31 MED ORDER — SODIUM BICARBONATE 8.4 % IV SOLN
INTRAVENOUS | Status: DC
  Administered 2016-08-31 – 2016-09-02 (×4): via INTRAVENOUS
  Filled 2016-08-31 (×6): qty 75

## 2016-08-31 MED ORDER — POTASSIUM CHLORIDE 2 MEQ/ML IV SOLN
30.0000 meq | Freq: Once | INTRAVENOUS | Status: AC
  Administered 2016-08-31: 30 meq via INTRAVENOUS
  Filled 2016-08-31: qty 15

## 2016-08-31 MED ORDER — STERILE WATER FOR INJECTION IV SOLN
INTRAVENOUS | Status: DC
  Administered 2016-08-31: 01:00:00 via INTRAVENOUS
  Filled 2016-08-31 (×3): qty 850

## 2016-08-31 MED ORDER — BUDESONIDE 0.5 MG/2ML IN SUSP
0.5000 mg | Freq: Two times a day (BID) | RESPIRATORY_TRACT | Status: DC
  Administered 2016-08-31 – 2016-10-05 (×71): 0.5 mg via RESPIRATORY_TRACT
  Filled 2016-08-31 (×72): qty 2

## 2016-08-31 NOTE — Progress Notes (Addendum)
Subjective:  Patient is unresponsive  Antibiotics:  Anti-infectives    Start     Dose/Rate Route Frequency Ordered Stop   09/01/16 1300  anidulafungin (ERAXIS) 100 mg in sodium chloride 0.9 % 100 mL IVPB     100 mg over 90 Minutes Intravenous Every 24 hours 08/31/16 1240     08/31/16 1700  imipenem-cilastatin (PRIMAXIN) 500 mg in sodium chloride 0.9 % 100 mL IVPB     500 mg 200 mL/hr over 30 Minutes Intravenous Every 8 hours 08/31/16 1240     08/31/16 1300  anidulafungin (ERAXIS) 200 mg in sodium chloride 0.9 % 200 mL IVPB     200 mg over 180 Minutes Intravenous  Once 08/31/16 1240     08/16/2016 0557  tigecycline (TYGACIL) 50 mg in sodium chloride 0.9 % 100 mL IVPB  Status:  Discontinued     50 mg 200 mL/hr over 30 Minutes Intravenous Every 12 hours 08/28/16 1758 08/31/16 1151   08/28/16 1800  tigecycline (TYGACIL) 100 mg in sodium chloride 0.9 % 100 mL IVPB     100 mg 200 mL/hr over 30 Minutes Intravenous  Once 08/28/16 1758     08/28/16 1300  linezolid (ZYVOX) IVPB 600 mg     600 mg 300 mL/hr over 60 Minutes Intravenous Every 12 hours 08/28/16 1239     08/28/16 0200  aztreonam (AZACTAM) 1 g in dextrose 5 % 50 mL IVPB  Status:  Discontinued     1 g 100 mL/hr over 30 Minutes Intravenous Every 8 hours 08/27/16 1802 08/28/16 1232   08/27/16 2000  vancomycin (VANCOCIN) 500 mg in sodium chloride 0.9 % 100 mL IVPB  Status:  Discontinued     500 mg 100 mL/hr over 60 Minutes Intravenous Every 12 hours 08/27/16 1802 08/28/16 1239   08/27/16 1800  aztreonam (AZACTAM) 2 g in dextrose 5 % 50 mL IVPB     2 g 100 mL/hr over 30 Minutes Intravenous  Once 08/27/16 1634 08/27/16 1852   08/27/16 1600  levofloxacin (LEVAQUIN) IVPB 750 mg  Status:  Discontinued     750 mg 100 mL/hr over 90 Minutes Intravenous Every 24 hours 08/08/2016 1324 08/17/2016 1522   08/27/16 0000  vancomycin (VANCOCIN) 500 mg in sodium chloride 0.9 % 100 mL IVPB  Status:  Discontinued     500 mg 100 mL/hr over 60  Minutes Intravenous Every 12 hours 08/17/2016 1324 08/10/2016 1522   08/29/2016 2200  aztreonam (AZACTAM) 1 g in dextrose 5 % 50 mL IVPB  Status:  Discontinued     1 g 100 mL/hr over 30 Minutes Intravenous Every 8 hours 08/22/2016 1324 08/22/2016 1522   08/08/2016 1600  linezolid (ZYVOX) tablet 600 mg  Status:  Discontinued     600 mg Oral Every 12 hours 08/25/2016 1458 08/27/16 1347   08/08/2016 1300  levofloxacin (LEVAQUIN) IVPB 750 mg  Status:  Discontinued     750 mg 100 mL/hr over 90 Minutes Intravenous  Once 08/11/2016 1246 08/25/2016 1522   08/18/2016 1300  aztreonam (AZACTAM) 2 g in dextrose 5 % 50 mL IVPB  Status:  Discontinued     2 g 100 mL/hr over 30 Minutes Intravenous  Once 08/18/2016 1246 09/01/2016 1522   08/11/2016 1300  vancomycin (VANCOCIN) IVPB 1000 mg/200 mL premix  Status:  Discontinued     1,000 mg 200 mL/hr over 60 Minutes Intravenous  Once 08/12/2016 1246 08/25/2016 1522      Medications: Scheduled  Meds: . [START ON 09/01/2016] anidulafungin  100 mg Intravenous Q24H  . anidulafungin  200 mg Intravenous Once  . budesonide (PULMICORT) nebulizer solution  0.5 mg Nebulization BID  . imipenem-cilastatin  500 mg Intravenous Q8H   And  . dexamethasone  4 mg Intravenous Q8H  . insulin aspart  0-9 Units Subcutaneous Q4H  . ipratropium  0.5 mg Nebulization Q6H  . linezolid (ZYVOX) IV  600 mg Intravenous Q12H  . midodrine  20 mg Per Tube TID WC  . mupirocin ointment  1 application Nasal BID  . oxybutynin  5 mg Per Tube BID  . pantoprazole sodium  40 mg Per Tube Daily  . scopolamine  1 patch Transdermal Q72H  . tigecycline (TYGACIL) IVPB  100 mg Intravenous Once   Continuous Infusions: . feeding supplement (JEVITY 1.2 CAL) Stopped (08/31/16 0230)  . norepinephrine (LEVOPHED) Adult infusion Stopped (08/31/16 0900)  .  sodium bicarbonate  infusion 1000 mL 75 mL/hr at 08/31/16 1200   PRN Meds:.acetaminophen, bisacodyl, naLOXone (NARCAN)  injection    Objective: Weight change:    Intake/Output Summary (Last 24 hours) at 08/31/16 1406 Last data filed at 08/31/16 1100  Gross per 24 hour  Intake          1864.96 ml  Output             2035 ml  Net          -170.04 ml   Blood pressure (!) 96/57, pulse (!) 114, temperature 99.8 F (37.7 C), temperature source Oral, resp. rate 14, height 5\' 8"  (1.727 m), weight 108 lb (49 kg), SpO2 99 %. Temp:  [98.6 F (37 C)-100.6 F (38.1 C)] 99.8 F (37.7 C) (11/24 1154) Pulse Rate:  [33-129] 114 (11/24 1145) Resp:  [9-38] 14 (11/24 1145) BP: (58-125)/(45-87) 96/57 (11/24 1113) SpO2:  [90 %-100 %] 99 % (11/24 1145) Arterial Line BP: (71-145)/(34-63) 99/55 (11/24 1145) FiO2 (%):  [30 %-100 %] 100 % (11/24 1145) Weight:  [108 lb (49 kg)] 108 lb (49 kg) (11/24 0500)  Physical Exam: General: unresponsive HEENT: anicteric sclera, extraocular movement intact pupils are fairly fixed and not responsive his eyes and cells are edematous as are his eyelids and face, he has high-pitched wheezing noises are verbal louder in the neck than in the lungs. CVS tachycardic rate, normal r,  no murmur rubs or gallops Chest: + rhonchi, wheezes Abdomen: soft nondistended, normal bowel sounds, Extremities: Edematous in particular in his upper extremities face  Skin: Anterior decubitus ulcers are visible not posterior ones   Neuro: Unresponsive  CBC:  CBC Latest Ref Rng & Units 08/31/2016 08/31/2016 08/30/2016  WBC 4.0 - 10.5 K/uL 80.8(HH) 44.3(H) 52.9(HH)  Hemoglobin 13.0 - 17.0 g/dL 10.2(L) 10.7(L) 10.9(L)  Hematocrit 39.0 - 52.0 % 30.8(L) 32.8(L) 33.2(L)  Platelets 150 - 400 K/uL 145(L) 197 305      BMET  Recent Labs  08/31/16 0024 08/31/16 0530  NA 135 140  K 3.7 3.2*  CL 115* 112*  CO2 14* 20*  GLUCOSE 104* 98  BUN 44* 43*  CREATININE 1.28* 1.24  CALCIUM 7.7* 7.4*     Liver Panel   Recent Labs  08/30/16 0500 08/31/16 0024  PROT 5.1* 3.7*  ALBUMIN 1.5* 1.2*  AST 15 26  ALT 12* 9*  ALKPHOS 124 132*   BILITOT 0.5 0.1*       Sedimentation Rate No results for input(s): ESRSEDRATE in the last 72 hours. C-Reactive Protein No results for input(s): CRP in  the last 72 hours.  Micro Results: Recent Results (from the past 720 hour(s))  Urine culture     Status: Abnormal   Collection Time: 08/09/16  9:05 PM  Result Value Ref Range Status   Specimen Description URINE, RANDOM  Final   Special Requests NONE  Final   Culture MULTIPLE SPECIES PRESENT, SUGGEST RECOLLECTION (A)  Final   Report Status 08/11/2016 FINAL  Final  Blood Culture (routine x 2)     Status: None   Collection Time: 08/09/16 10:30 PM  Result Value Ref Range Status   Specimen Description BLOOD LEFT HAND  Final   Special Requests IN PEDIATRIC BOTTLE 1CC  Final   Culture NO GROWTH 5 DAYS  Final   Report Status 08/14/2016 FINAL  Final  Blood Culture (routine x 2)     Status: None   Collection Time: 08/09/16 10:45 PM  Result Value Ref Range Status   Specimen Description BLOOD RIGHT HAND  Final   Special Requests IN PEDIATRIC BOTTLE 2ML  Final   Culture NO GROWTH 5 DAYS  Final   Report Status 08/14/2016 FINAL  Final  Culture, blood (Routine X 2) w Reflex to ID Panel     Status: None   Collection Time: 08/11/16 11:40 AM  Result Value Ref Range Status   Specimen Description BLOOD LEFT ANTECUBITAL  Final   Special Requests IN PEDIATRIC BOTTLE 1CC  Final   Culture NO GROWTH 5 DAYS  Final   Report Status 08/16/2016 FINAL  Final  Culture, blood (Routine X 2) w Reflex to ID Panel     Status: None   Collection Time: 08/11/16 11:45 AM  Result Value Ref Range Status   Specimen Description BLOOD BLOOD LEFT HAND  Final   Special Requests IN PEDIATRIC BOTTLE 3CC  Final   Culture NO GROWTH 5 DAYS  Final   Report Status 08/16/2016 FINAL  Final  Urine culture     Status: Abnormal   Collection Time: 08/11/2016 12:04 PM  Result Value Ref Range Status   Specimen Description URINE, SUPRAPUBIC  Final   Special Requests NONE  Final    Culture (A)  Final    >=100,000 COLONIES/mL ENTEROCOCCUS FAECALIS >=100,000 COLONIES/mL YEAST 20,000 COLONIES/mL ACINETOBACTER CALCOACETICUS/BAUMANNII COMPLEX    Report Status 08/09/2016 FINAL  Final   Organism ID, Bacteria ENTEROCOCCUS FAECALIS (A)  Final   Organism ID, Bacteria ACINETOBACTER CALCOACETICUS/BAUMANNII COMPLEX (A)  Final      Susceptibility   Acinetobacter calcoaceticus/baumannii complex - MIC*    CEFTAZIDIME >=64 RESISTANT Resistant     CEFTRIAXONE >=64 RESISTANT Resistant     CIPROFLOXACIN >=4 RESISTANT Resistant     GENTAMICIN 4 SENSITIVE Sensitive     IMIPENEM 1 SENSITIVE Sensitive     PIP/TAZO <=4 SENSITIVE Sensitive     TRIMETH/SULFA >=320 RESISTANT Resistant     CEFEPIME >=64 RESISTANT Resistant     AMPICILLIN/SULBACTAM 4 SENSITIVE Sensitive     * 20,000 COLONIES/mL ACINETOBACTER CALCOACETICUS/BAUMANNII COMPLEX   Enterococcus faecalis - MIC*    AMPICILLIN <=2 SENSITIVE Sensitive     LEVOFLOXACIN >=8 RESISTANT Resistant     NITROFURANTOIN <=16 SENSITIVE Sensitive     VANCOMYCIN 1 SENSITIVE Sensitive     * >=100,000 COLONIES/mL ENTEROCOCCUS FAECALIS  Blood Culture (routine x 2)     Status: Abnormal (Preliminary result)   Collection Time: 08/13/2016 12:07 PM  Result Value Ref Range Status   Specimen Description BLOOD LEFT WRIST  Final   Special Requests BOTTLES DRAWN AEROBIC AND ANAEROBIC  10CC  Final   Culture  Setup Time   Final    IN BOTH AEROBIC AND ANAEROBIC BOTTLES GRAM POSITIVE COCCI IN PAIRS AND CHAINS CRITICAL RESULT CALLED TO, READ BACK BY AND VERIFIED WITH: ANDY JOHNSTON,PHARMD @0013  08/28/16 MKELLY,MLT    Culture VIRIDANS STREPTOCOCCUS (A)  Final   Report Status PENDING  Incomplete   Organism ID, Bacteria VIRIDANS STREPTOCOCCUS  Final      Susceptibility   Viridans streptococcus - MIC*    PENICILLIN <=0.06 SENSITIVE Sensitive     CEFTRIAXONE 0.25 SENSITIVE Sensitive     ERYTHROMYCIN <=0.12 SENSITIVE Sensitive     LEVOFLOXACIN <=0.25 SENSITIVE  Sensitive     VANCOMYCIN 0.25 SENSITIVE Sensitive     * VIRIDANS STREPTOCOCCUS  Blood Culture ID Panel (Reflexed)     Status: Abnormal   Collection Time: 08/12/2016 12:07 PM  Result Value Ref Range Status   Enterococcus species NOT DETECTED NOT DETECTED Final   Listeria monocytogenes NOT DETECTED NOT DETECTED Final   Staphylococcus species NOT DETECTED NOT DETECTED Final   Staphylococcus aureus NOT DETECTED NOT DETECTED Final   Streptococcus species DETECTED (A) NOT DETECTED Final    Comment: CRITICAL RESULT CALLED TO, READ BACK BY AND VERIFIED WITH: ANDY JOHNSTON,PHARMD @0013  08/28/16 MKELLY,MLT    Streptococcus agalactiae NOT DETECTED NOT DETECTED Final   Streptococcus pneumoniae NOT DETECTED NOT DETECTED Final   Streptococcus pyogenes NOT DETECTED NOT DETECTED Final   Acinetobacter baumannii NOT DETECTED NOT DETECTED Final   Enterobacteriaceae species NOT DETECTED NOT DETECTED Final   Enterobacter cloacae complex NOT DETECTED NOT DETECTED Final   Escherichia coli NOT DETECTED NOT DETECTED Final   Klebsiella oxytoca NOT DETECTED NOT DETECTED Final   Klebsiella pneumoniae NOT DETECTED NOT DETECTED Final   Proteus species NOT DETECTED NOT DETECTED Final   Serratia marcescens NOT DETECTED NOT DETECTED Final   Haemophilus influenzae NOT DETECTED NOT DETECTED Final   Neisseria meningitidis NOT DETECTED NOT DETECTED Final   Pseudomonas aeruginosa NOT DETECTED NOT DETECTED Final   Candida albicans NOT DETECTED NOT DETECTED Final   Candida glabrata NOT DETECTED NOT DETECTED Final   Candida krusei NOT DETECTED NOT DETECTED Final   Candida parapsilosis NOT DETECTED NOT DETECTED Final   Candida tropicalis NOT DETECTED NOT DETECTED Final  Culture, blood (x 2)     Status: Abnormal (Preliminary result)   Collection Time: 08/22/2016  6:10 PM  Result Value Ref Range Status   Specimen Description BLOOD RIGHT HAND  Final   Special Requests IN PEDIATRIC BOTTLE 2CC  Final   Culture  Setup Time    Final    GRAM POSITIVE COCCI IN PAIRS IN CHAINS IN PEDIATRIC BOTTLE CRITICAL RESULT CALLED TO, READ BACK BY AND VERIFIED WITH: VERONDA BRYK,PHARMD @0206  08/29/16 MKELLY,MLT    Culture STREPTOCOCCUS SPECIES (A)  Final   Report Status PENDING  Incomplete  Culture, blood (x 2)     Status: Abnormal (Preliminary result)   Collection Time: 08/09/2016  6:14 PM  Result Value Ref Range Status   Specimen Description BLOOD RIGHT THUMB  Final   Special Requests IN PEDIATRIC BOTTLE 1CC  Final   Culture  Setup Time   Final    GRAM POSITIVE COCCI IN PAIRS IN PEDIATRIC BOTTLE CRITICAL VALUE NOTED.  VALUE IS CONSISTENT WITH PREVIOUSLY REPORTED AND CALLED VALUE.    Culture (A)  Final    STREPTOCOCCUS CONSTELLATUS SUSCEPTIBILITIES TO FOLLOW    Report Status PENDING  Incomplete  MRSA PCR Screening     Status:  Abnormal   Collection Time: 08/27/16  3:50 AM  Result Value Ref Range Status   MRSA by PCR POSITIVE (A) NEGATIVE Final    Comment:        The GeneXpert MRSA Assay (FDA approved for NASAL specimens only), is one component of a comprehensive MRSA colonization surveillance program. It is not intended to diagnose MRSA infection nor to guide or monitor treatment for MRSA infections. RESULT CALLED TO, READ BACK BY AND VERIFIED WITH: EVERETTE,D RN 08/27/16 AT 0515 SKEEN,P   Aerobic/Anaerobic Culture (surgical/deep wound)     Status: None (Preliminary result)   Collection Time: 09/04/2016  4:01 PM  Result Value Ref Range Status   Specimen Description TISSUE BONE  Final   Special Requests SACRAL  Final   Gram Stain   Final    ABUNDANT WBC PRESENT, PREDOMINANTLY PMN ABUNDANT GRAM NEGATIVE RODS ABUNDANT GRAM POSITIVE COCCI IN PAIRS FEW GRAM POSITIVE RODS    Culture   Final    MODERATE KLEBSIELLA PNEUMONIAE Confirmed Extended Spectrum Beta-Lactamase Producer (ESBL) CULTURE REINCUBATED FOR BETTER GROWTH    Report Status PENDING  Incomplete   Organism ID, Bacteria KLEBSIELLA PNEUMONIAE  Final       Susceptibility   Klebsiella pneumoniae - MIC*    AMPICILLIN >=32 RESISTANT Resistant     CEFAZOLIN >=64 RESISTANT Resistant     CEFEPIME >=64 RESISTANT Resistant     CEFTAZIDIME >=64 RESISTANT Resistant     CEFTRIAXONE >=64 RESISTANT Resistant     CIPROFLOXACIN 2 INTERMEDIATE Intermediate     GENTAMICIN >=16 RESISTANT Resistant     IMIPENEM <=0.25 SENSITIVE Sensitive     TRIMETH/SULFA >=320 RESISTANT Resistant     AMPICILLIN/SULBACTAM >=32 RESISTANT Resistant     PIP/TAZO >=128 RESISTANT Resistant     Extended ESBL POSITIVE Resistant     * MODERATE KLEBSIELLA PNEUMONIAE  Culture, respiratory (NON-Expectorated)     Status: None (Preliminary result)   Collection Time: 08/30/16  3:56 PM  Result Value Ref Range Status   Specimen Description TRACHEAL ASPIRATE  Final   Special Requests NONE  Final   Gram Stain   Final    ABUNDANT WBC PRESENT,BOTH PMN AND MONONUCLEAR MODERATE GRAM POSITIVE COCCI IN PAIRS FEW GRAM VARIABLE ROD FEW YEAST    Culture TOO YOUNG TO READ  Final   Report Status PENDING  Incomplete  Culture, respiratory (NON-Expectorated)     Status: None (Preliminary result)   Collection Time: 08/31/16  1:20 AM  Result Value Ref Range Status   Specimen Description TRACHEAL ASPIRATE  Final   Special Requests NONE  Final   Gram Stain   Final    ABUNDANT WBC PRESENT,BOTH PMN AND MONONUCLEAR NO ORGANISMS SEEN    Culture PENDING  Incomplete   Report Status PENDING  Incomplete    Studies/Results: US Abdomen Complete  Result Date: 08/31/2016 CLINICAL DATA:  Sepsis. Decreased blood pressure tonight. Patient is on ventilator. History of diabetes, partial quadriplegic, stroke. EXAM: ABDOMEN ULTRASOUND COMPLETE COMPARISON:  CT abdomen and pelvis 08/29/2016 FINDINGS: Gallbladder: Small stones in the gallbladder neck. Diffuse sludge in the gallbladder. No gallbladder wall thickening or edema. Murphy's sign is negative. Common bile duct: Diameter: 5.3 mm, normal Liver: No focal  lesion identified. Within normal limits in parenchymal echogenicity. IVC: The IVC filter is noted. Pancreas: Limited visualization due to overlying bowel gas. Spleen: Size and appearance within normal limits. Right Kidney: Length: 10.1 cm. Increased parenchymal echotexture suggesting medical renal disease. No mass or hydronephrosis visualized. Left Kidney: Length: 10.9  cm. Increased parenchymal echotexture suggesting medical renal disease. No mass or hydronephrosis visualized. Abdominal aorta: No aneurysm visualized. Other findings: Free fluid around the liver and in the right lower quadrant and left lower quadrant consistent with ascites. Small bilateral pleural effusion. IMPRESSION: Small stones in the gallbladder neck with sludge in the gallbladder. No wall thickening or edema. Increased parenchymal echotexture in the kidneys suggesting medical renal disease. Diffuse ascites with small bilateral pleural effusion. Electronically Signed   By: Lucienne Capers M.D.   On: 08/31/2016 05:19   Dg Chest Port 1 View  Result Date: 08/31/2016 CLINICAL DATA:  Status post central line placement EXAM: PORTABLE CHEST 1 VIEW COMPARISON:  08/31/2016 FINDINGS: Cardiac shadow is stable. Diffuse bilateral infiltrates are again identified predominately in the upper lobes. Tracheostomy tube and right-sided PICC line are again seen and stable. A new right jugular central line is noted with the tip in the mid superior vena cava. No pneumothorax is noted. IMPRESSION: Status post right jugular central line placement with the tip in the mid superior vena cava. No pneumothorax is noted. The remainder of the exam is stable from the previous study. Electronically Signed   By: Inez Catalina M.D.   On: 08/31/2016 13:13   Dg Chest Port 1 View  Result Date: 08/31/2016 CLINICAL DATA:  Respiratory failure. EXAM: PORTABLE CHEST 1 VIEW COMPARISON:  08/31/2016. FINDINGS: Tracheostomy tube, right PICC line stable position. Heart size stable.  Dense bilateral pulmonary infiltrates are again noted. Right lower lobe atelectasis. Bilateral pleural effusions again are noted inter stable. IMPRESSION: 1.  Lines and tubes in stable position. 2. Dense bilateral pulmonary infiltrates are again noted without interim improvement. Right lower lobe atelectasis. Unchanged small bilateral pleural effusions. Electronically Signed   By: Marcello Moores  Register   On: 08/31/2016 06:58   Dg Chest Port 1 View  Result Date: 08/31/2016 CLINICAL DATA:  Hypoxemia. EXAM: PORTABLE CHEST 1 VIEW COMPARISON:  08/30/2016 FINDINGS: Tracheostomy tip is 6.8 cm above the carina. Right PICC line tip is over the low SVC region. No pneumothorax. Normal heart size. Since the previous study, there is increasing consolidation in both lungs, greater on the left. Small bilateral pleural effusions. IMPRESSION: Increasing consolidation in both lungs, more prominent on the left. Small bilateral pleural effusions. Electronically Signed   By: Lucienne Capers M.D.   On: 08/31/2016 00:23   Dg Chest Port 1 View  Result Date: 08/30/2016 CLINICAL DATA:  Respiratory failure.  Hypoxia. EXAM: PORTABLE CHEST 1 VIEW COMPARISON:  One-view chest x-ray 08/24/2016 FINDINGS: Tracheostomy tube is in place. Right-sided PICC line is stable. The heart is enlarged. Diffuse interstitial and airspace disease is again seen. Bilateral pleural effusions have increased. IMPRESSION: 1. Cardiomegaly with increasing interstitial and airspace disease in bilateral pleural effusions compatible with congestive heart failure. 2. Increasing airspace disease likely reflects edema but infection is not excluded. 3. Support apparatus is stable. Electronically Signed   By: San Morelle M.D.   On: 08/30/2016 09:19      Assessment/Plan:  INTERVAL HISTORY:  48 hours if shown worsening acute clinoid mental status that has not responded to stopping opiates and giving him Narcan for reversal of said opiates he remains  unresponsive with fixed pupils   She has had further deterioration and has become hypotensive with progressive shock.  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 ([place])   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([place])   History of pulmonary embolism   DVT (deep venous thrombosis) ([name])   Severe protein-calorie malnutrition ([name])   Dysphagia, pharyngoesophageal phase   Decubitus ulcer of right perineal ischial region, stage 4 ([name])   Decubitus ulcer of left perineal ischial region, stage 4 ([name])   Sacral decubitus ulcer, stage IV ([name])   Multiple allergies   Recurrent UTI   Respiratory failure, chronic ([name])   Aspiration pneumonia of right lower lobe ([name])   Seizures ([name])   Anxiety state   Anemia of chronic disease   Hyponatremia   Encephalopathy   Other acute osteomyelitis, multiple sites ([place])   Lower GI bleed   Viridans streptococci infection   Coma of unknown cause ([place])   Septic shock ([place])    Latisha Vanlenten Sieminski is a 29 y.o. male with  very complicated medical history due to his paralysis and extensive decubitus ulcers. He came into the hospital with bleeding from his stoma site but also found to be with septic shock from viridans group streptococcal bacteremia. He has subsequent become encephalopathic. Antibiotics have been adjusted based on his multiple allergies. He is currently on Zyvox and tigecycline. Become obtunded in the last 72 hours despite removal of narcotics administration of Narcan and continued support. Been transferred to the inten intensive care unit and has continued to worsen. Blood pressure is very tenuous he is on pressors and ventilatory support and appears to be doing very very poorly  #1 Septic shock plus or minus her forms of shock including potentially anaphylactic reaction:  We will change from Zyvox and tigecycline to Zyvox  and imipenem  with premedication with Decadron we will also start eraxis  Suspect much of what is going on is sepsis being driven by his severe pelvic osteomyelitis, persistent bacteremia with viridans group strep, and potential new pathology.   It  is possible he is also suffering from an anaphylactic reaction to medication. He has had reactions to multiple different drug classes with anaphylaxis being one of the symptoms he has had several drugs including penicillins ertapenem, aztreonam  Not see that he ever had a problem with the tetracycline or Zyvox.  Do think he should have anti-MRSA coverage. The problem is is that there was concern that he could've had edema and potential developing anaphylaxis while he was on vancomycin and aztreonam with Dr. Graylon Good saw him. He also has documented anaphylaxis to sulfa antibiotics and to cephalosporins.  At present we continue current therapy I'm debating what we may need to move to from Zyvox since he is ready dropping his platelets on this drug. Should removed to vancomycin though there was some concern about that potentially causing anaphylaxis when he was on that and aztreonam or should move to teflaro which is an antistaphylococcal cephalosporin the problem here would be yet another beta-lactam in addition to the imipenem which would be increasing risk for seizure. I think that is probably the least of his concerns at this point time though will discuss further with my infectious disease pharmacists  #2 viridans group streptococcal bacteremia: He is easily covered for this with his current antibiotics   #3 sacral osteomyelitis this sounds to have deteriorated substantially based on Dr. Eusebio Friendly op note.  He is growing ESBL from cx  #4 compression fracture with lucency: Could be osteomyelitis as well right now he is fairly broadly covered with his current regimen he is not in a status to have an aspirate from the  site.  #4 encephalopathy with up  tended status, : see above discussion  #5 adrenal insufficiency on corticosteroids.  #6 Facial edema and upper extremity edema: Going to high-dose steroids in case he is suffering from an anaphylactic reaction. His multiple reactions on different classes of antibiotics really don't make much sense to me. Do agree with the high-dose steroids at this point in time  # 7 goals of care his prognosis seems very poor this point in time and less he has something that is reversible such as an adverse drug reaction that we could correct with steroids and/or stopping the offending drugs. That latter option is very very confusing giving his history of allergic reactions   We spent greater than 35 minutes with the patient including greater than 50% of time in face to face counsel of the patient patient's mom and dad with regards to his shock unresponsiveness viridans group streptococcal bacteremia his pelvic osteomyelitis possible vertebral osteomyelitis sepsis GI bleed encephalopathy goals of care and in coordination of his care with pharmacy critical care    LOS: 5 days   [name] 08/31/2016, 2:06 PM

## 2016-08-31 NOTE — Progress Notes (Signed)
On AM assessment pt appeared to be using accessories muscles while breathing on ventilator. Pt also had shoulder movement which appeared to be decorticate posturing. See other details of neuro assessment on flowsheet. Patient is unable to go to MRI due to ventilatory settings. MD made aware of pt status. No orders at this time.

## 2016-08-31 NOTE — Progress Notes (Signed)
eLink Physician-Brief Progress Note Patient Name: Jason [name] DOB: October 22, 1985 MRN: [id number]   Date of Service  08/31/2016  HPI/Events of Note  Patient with metabolic acidosis with pH 7.13/31/60/10.  Lactate WNL.  Is quad with low body mass and BUN/creat and bicarb of 16.  eICU Interventions  Plan: 2 amps of bicarb IVP Bicarb gtt - consider bicarb tablets via PEG Increase RR on vent to 25 Recheck ABG in 2 - 3 hours     Intervention Category Major Interventions: Acid-Base disturbance - evaluation and management  DETERDING,ELIZABETH 08/31/2016, 12:23 AM

## 2016-08-31 NOTE — Progress Notes (Signed)
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 Started on Azactam without premedication and he has become lethargic and has mild swelling around trach without compromise. Solumedrol has been started and he is HD stable. Mother reports this is his usual reaction to Azactam. PCCM saw 11/21 and signed off.  Called back 11/23 for worsening lethargy and request for vent support.    SIGNIFICANT EVENTS  11/22>> surgical bx sacral bone/wound  STUDIES:  11/21 ct head >>neg MRI head 11/23>>>  ABx:  linezolid 11/19>>> tigecycline 11/21>>>  MICRO:  BC x 2 11/23>>> Sacral bone 11/22>>>  SUBJECTIVE:  Increasing lethargy this am.  Mild hypoxia on ABG so placed on vent with improvement in mental status per RN.  Remains lethargic but arousable on vent. Now in ICU.   VITAL SIGNS: Temp:  [98.6 F (37 C)-100.6 F (38.1 C)] 98.6 F (37 C) (11/24 0807) Pulse Rate:  [33-125] 110 (11/24 0805) Resp:  [12-143] 27 (11/24 0805) BP: (58-131)/(45-87) 124/59 (11/24 0805) SpO2:  [90 %-100 %] 97 % (11/24 0805) Arterial Line BP: (71-145)/(34-63) 120/56 (11/24 0800) FiO2 (%):  [30 %-100 %] 100 % (11/24 0805) Weight:  [49 kg (108 lb)] 49 kg (108 lb) (11/24 0500)  PHYSICAL EXAMINATION: General:  Chronically ill appearing male, NAD on vent  Neuro:  Lethargic but arousable, quad HEENT:  Mm moist, no JVD, chronic trach #6 cuffed c/d  Cardiovascular:  s1s2 rrr Lungs:  resps even non labored on vent, scattered rhonchi  Abdomen:  Round, soft, +bs  Musculoskeletal:  Warm and dry, large sacral decub, dressed   Recent Labs Lab 08/30/16 0500 08/31/16 0024 08/31/16 0530  NA 133* 135 140  K 4.4 3.7 3.2*  CL 112* 115*  112*  CO2 16* 14* 20*  BUN 47* 44* 43*  CREATININE 1.33* 1.28* 1.24  GLUCOSE 128* 104* 98    Recent Labs Lab 08/30/16 0500 08/31/16 0024 08/31/16 0530  HGB 10.9* 10.7* 10.2*  HCT 33.2* 32.8* 30.8*  WBC 52.9* 44.3* 80.8*  PLT 305 197 145*   US Abdomen Complete  Result Date: 08/31/2016 CLINICAL DATA:  Sepsis. Decreased blood pressure tonight. Patient is on ventilator. History of diabetes, partial quadriplegic, stroke. EXAM: ABDOMEN ULTRASOUND COMPLETE COMPARISON:  CT abdomen and pelvis 09/03/2016 FINDINGS: Gallbladder: Small stones in the gallbladder neck. Diffuse sludge in the gallbladder. No gallbladder wall thickening or edema. Murphy's sign is negative. Common bile duct: Diameter: 5.3 mm, normal Liver: No focal lesion identified. Within normal limits in parenchymal echogenicity. IVC: The IVC filter is noted. Pancreas: Limited visualization due to overlying bowel gas. Spleen: Size and appearance within normal limits. Right Kidney: Length: 10.1 cm. Increased parenchymal echotexture suggesting medical renal disease. No mass or hydronephrosis visualized. Left Kidney: Length: 10.9 cm. Increased parenchymal echotexture suggesting medical renal disease. No mass or hydronephrosis visualized. Abdominal aorta: No aneurysm visualized. Other findings: Free fluid around the liver and in the right lower quadrant and left lower quadrant consistent with ascites. Small bilateral pleural effusion. IMPRESSION: Small stones in the gallbladder neck with sludge in the gallbladder. No wall thickening or edema. Increased parenchymal echotexture in the kidneys suggesting medical renal disease. Diffuse ascites with small bilateral pleural effusion. Electronically Signed  By: Lucienne Capers M.D.   On: 08/31/2016 05:19   Dg Chest Port 1 View  Result Date: 08/31/2016 CLINICAL DATA:  Respiratory failure. EXAM: PORTABLE CHEST 1 VIEW COMPARISON:  08/31/2016. FINDINGS: Tracheostomy tube, right PICC line stable  position. Heart size stable. Dense bilateral pulmonary infiltrates are again noted. Right lower lobe atelectasis. Bilateral pleural effusions again are noted inter stable. IMPRESSION: 1.  Lines and tubes in stable position. 2. Dense bilateral pulmonary infiltrates are again noted without interim improvement. Right lower lobe atelectasis. Unchanged small bilateral pleural effusions. Electronically Signed   By: Marcello Moores  Register   On: 08/31/2016 06:58   Dg Chest Port 1 View  Result Date: 08/31/2016 CLINICAL DATA:  Hypoxemia. EXAM: PORTABLE CHEST 1 VIEW COMPARISON:  08/30/2016 FINDINGS: Tracheostomy tip is 6.8 cm above the carina. Right PICC line tip is over the low SVC region. No pneumothorax. Normal heart size. Since the previous study, there is increasing consolidation in both lungs, greater on the left. Small bilateral pleural effusions. IMPRESSION: Increasing consolidation in both lungs, more prominent on the left. Small bilateral pleural effusions. Electronically Signed   By: Lucienne Capers M.D.   On: 08/31/2016 00:23   Dg Chest Port 1 View  Result Date: 08/30/2016 CLINICAL DATA:  Respiratory failure.  Hypoxia. EXAM: PORTABLE CHEST 1 VIEW COMPARISON:  One-view chest x-ray 09/03/2016 FINDINGS: Tracheostomy tube is in place. Right-sided PICC line is stable. The heart is enlarged. Diffuse interstitial and airspace disease is again seen. Bilateral pleural effusions have increased. IMPRESSION: 1. Cardiomegaly with increasing interstitial and airspace disease in bilateral pleural effusions compatible with congestive heart failure. 2. Increasing airspace disease likely reflects edema but infection is not excluded. 3. Support apparatus is stable. Electronically Signed   By: San Morelle M.D.   On: 08/30/2016 09:19   Dg Chest Port 1 View  Result Date: 08/19/2016 CLINICAL DATA:  Pneumonia EXAM: PORTABLE CHEST 1 VIEW COMPARISON:  Chest x-ray of 08/28/2016 FINDINGS: A tracheostomy is again noted.  Right PICC line tip overlies the lower SVC near the expected SVC -RA junction. There is moderate cardiomegaly present and there does appear to be at least a right pleural effusion. There is pulmonary vascular congestion, findings most consistent with mild congestive heart failure. Superimposed pneumonia would be difficult to exclude. IMPRESSION: 1. Suspect mild CHF with cardiomegaly, pulmonary vascular congestion, and small right pleural effusion. 2. Tracheostomy and right PICC line unchanged in position. Electronically Signed   By: [name]   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 No weaning. On 100% Fio2 and PEEP of 10.  Cont abx per ID.  Considering antifungal. Will need ct scan of abd/chest once more stable.  Pulmonary hygiene as able  BD's  Hypotensive overnight.  Will hold off on diuresis  Severe sepsis/septic shock.  - multifactorial in setting multiple wounds, osteomyelitis +/- septic arthritis +/- HCAP.  Adrenal insufficiency Chronic hypotension  PLAN -  abx as mentioned - tigecycline, linezolid > will d/w ID. Potentially start antifungal.  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 Wound nurse following   AMS/progressive lethargy - unclear etiology.  CT head neg.  Ammonia only mildly elevated. Has been intermittent throughout admission apparently.  Highly doubt this is reaction to abx as mother is concerned about.  Also  doubt it is solely r/t hypoxia although he did initially have some improvement on vent. Likely multifactorial in setting polypharmacy, metabolic encephalopathy, anesthesia/OR 11/22, SIRS/sepsis and possibly overall decline.  No improvement over last few days after holding narcotics or with dose of narcan.  Did have episode of significant hypoglycemia (20) 11/20 which could have prompted AMS from which he has just not recovered.  Clinically worse last 24 hrs.  PLAN - F/u on EEG. Will need MRI when more stable.   Hold ALL sedating medications    Lower GI bleeding  Blood loss anemia  Diarrhea - improved  PLAN -  GI following  PPI  Hold TF 2/2 shock  AKI  Metabolic acidosis Lactic acidosis PLAN -  Dec bicarbonate drip to 75 mls/hr.   Critical care time with this patient : 30 minutes. Awaiting mother to arrive and discuss GOC.    Monica Becton, MD 08/31/2016, 9:25 AM [facility] Pager [phone] After 3 pm or if no answer, call [phone]

## 2016-08-31 NOTE — Procedures (Signed)
Arterial Catheter Insertion Procedure Note Jason [name] [id number] [birth year]  Procedure: Insertion of Arterial Catheter  Indications: Blood pressure monitoring  Procedure Details Consent: Unable to obtain consent because of emergent medical necessity. Time Out: Verified patient identification, verified procedure, site/side was marked, verified correct patient position, special equipment/implants available, medications/allergies/relevent history reviewed, required imaging and test results available.  Performed  Maximum sterile technique was used including antiseptics, cap, gloves, gown, hand hygiene, mask and sheet. Skin prep: Chlorhexidine; local anesthetic administered 20 gauge catheter was inserted into right radial artery using the Seldinger technique.  Evaluation Blood flow good; BP tracing good. Complications: No apparent complications.   Jason [name] 08/31/2016

## 2016-08-31 NOTE — Progress Notes (Signed)
eLink Physician-Brief Progress Note Patient Name: Jason [name] DOB: May 26, 1986 MRN: [id number]   Date of Service  08/31/2016  HPI/Events of Note  Persistent metabolic acidosis with pH 7.19/32/48/13 with hypoxia, hypotension and elevated lactate.  eICU Interventions  Plan: Additional bicarb/increase bicarb gtt NE gtt for BP support Increase in Peep/RR PCCM to evaluate at bedside - consider CT scan??     Intervention Category Major Interventions: Acid-Base disturbance - evaluation and management;Shock - evaluation and management  Mack Alvidrez 08/31/2016, 2:53 AM

## 2016-08-31 NOTE — Progress Notes (Signed)
Changes per MD

## 2016-08-31 NOTE — Progress Notes (Signed)
eLink Physician-Brief Progress Note Patient Name: Jason [name] DOB: Jul 01, 1986 MRN: [id number]   Date of Service  08/31/2016  HPI/Events of Note  Persistent hypotension despite fluid bolus, change to bicarb in the setting on Lactic acidosis.  Known hypotension but unsure as to baseline BP but on home midodrine.  Making urine.  eICU Interventions  Plan: 500 cc fluid bolus NS now Insert aline to verify BP     Intervention Category Intermediate Interventions: Hypotension - evaluation and management  Trea Carnegie 08/31/2016, 2:05 AM

## 2016-08-31 NOTE — Progress Notes (Signed)
Pt known to PMT. Seen by several providers. Mother has always shared "she will know when it's time". Pt critically ill with WBC 80.8, albumin 1.2. He is unresponsive . No sedation on board. Called pt's mother. She informed me I awakened her and at this time was unable to commit to a time to meet. Will continue to try and arrange a time to meet with Mrs. Crossin. Discussed with Dr. Corrie Dandy Romona Curls, ANP

## 2016-08-31 NOTE — Progress Notes (Signed)
Nutrition Follow-up / Consult  DOCUMENTATION CODES:   Not applicable  INTERVENTION:    If/when able to resume TF via PEG, recommend Glucerna 1.2 at 70 ml/h 1680 ml) to provide 2016 kcal, 101 gm protein, 1352 ml free water daily.  NUTRITION DIAGNOSIS:   Increased nutrient needs related to wound healing as evidenced by estimated needs.  Ongoing  GOAL:   Patient will meet greater than or equal to 90% of their needs  Progressing  MONITOR:   PO intake, Labs, Weight trends, Skin, I & O's  REASON FOR ASSESSMENT:   Consult Enteral/tube feeding initiation and management  ASSESSMENT:   30 y.o. 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  S/P I&D with surgical bx sacral bone/wound on 11/22 due to sacral osteomyelitis. Developed mild hypoxia with increasing lethargy and required intubation on 11/23. Received MD Consult for TF initiation and management. PEG in place. External bumper was adjusted on 11/21 and leaking has stopped. Patient typically receives nocturnal TF with Glucerna 1.2 and eats regular foods during the day. Labs reviewed: potassium is low. Medications reviewed and include Solu-cortef and KCl. Discussed patient with RN today. He is very unstable and currently on no sedation. Jevity 1.2 TF was ordered yesterday. TF was stopped last night by NP. Will leave recommendations for TF if/when able to start. Patient is currently intubated on ventilator support MV: 16.7 L/min Temp (24hrs), Avg:99.6 F (37.6 C), Min:98.6 F (37 C), Max:100.6 F (38.1 C)   Diet Order:  Diet NPO time specifiedNPO  Skin:  Wound (see comment) (Stg 2 R elbow, back; stg 2 L heel; stg 4 coccyx, buttocks)  Last BM:   11/23 (ostomy)  Height:   Ht Readings from Last 1 Encounters:  08/23/2016 5\' 8"  (1.727 m)    Weight:   Wt Readings from Last 1 Encounters:  08/31/16 108 lb (49 kg)    Ideal Body Weight:  63 kg (adjusted for quadriplegia)  BMI:  Body mass index is 16.42 kg/m.  Estimated Nutritional Needs:   Kcal:  2000  Protein:  90-105 gm  Fluid:  2 L  EDUCATION NEEDS:   No education needs identified at this time  Molli Barrows, [place], [place], [place] Pager [phone] After Hours Pager [phone]

## 2016-08-31 NOTE — Progress Notes (Addendum)
Pharmacy Antibiotic Note  Jason [name] is a 30 y.o. male admitted on [id number] with very complicated ID history.  Pt has multiple antibiotic allergies making it very difficult to treat his infections. 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 Has been on tigecycline and linezolid, but will switch antibiotics today per ID.   Spoke with patient's mother, and she is ok with switching antibiotics.  Pt needs to be premedicated with steroids before receiving imipenem.  Dr. Tommy Medal wants to do high dose steroids since he is so sick in the ICU. Also, pt's SCr is elevated compared to his baseline - SCr 1.24 (baseline ~0.3-0.5). Platelets also decreasing from 197 to 145 on Linezolid, will need to monitor that closely.   Plan: - Stop tigecycline - Continue linezolid 600 mg IV q12h - Start Eraxis 200 mg IV x 1 then 100 mg IV q24h - Start imipenem 500 mg IV q8h + premedicate with decadron 4 mg IV q8h - Watch platelets carefully on linezolid - F/u renal fx, monitor for dose adjustments - F/u cultures, GOC, mental status  Height: 5\' 8"  (172.7 cm) Weight: 108 lb (49 kg) IBW/kg (Calculated) : 68.4  Temp (24hrs), Avg:99.8 F (37.7 C), Min:98.6 F (37 C), Max:100.6 F (38.1 C)   Recent Labs Lab 08/24/2016 2018  08/28/16 0415 08/12/2016 0615 08/28/2016 1055 08/30/16 0500 08/31/16 0003 08/31/16 0024 08/31/16 0250 08/31/16 0530  WBC  --   < > 36.5* 34.1*  --  52.9*  --  44.3*  --  80.8*  CREATININE  --   < > 1.00 1.23  --  1.33*  --  1.28*  --  1.24  LATICACIDVEN 0.8  --   --   --  1.5  --  3.9*  --  3.1* 3.5*  < > = values in this interval not displayed.  Estimated Creatinine Clearance: 59.3 mL/min (by C-G formula based on SCr of 1.24 mg/dL).    Allergies  Allergen Reactions  . Amikacin Sulfate Other (See Comments)    Seizure   . Cefuroxime Axetil Anaphylaxis    Tolerated cefepime 12/2015 - with Pepcid/Solu-Medrol  . Ertapenem Other (See  Comments)    Rash and confusion-->tolerated Imipenem   . Morphine And Related Other (See Comments)    Changed mental status, confusion, headache, visual hallucination  . Nsaids Itching and Other (See Comments)    Risk of bleeding, itching  . Penicillins Anaphylaxis and Other (See Comments)    Tolerated Imipenem; no reaction to 7 day course of amoxicillin in 2015 Has patient had a PCN reaction causing immediate rash, facial/tongue/throat swelling, SOB or lightheadedness with hypotension: Yes Has patient had a PCN reaction causing severe rash involving mucus membranes or skin necrosis: No Has patient had a PCN reaction that required hospitalization Yes Has patient had a PCN reaction occurring within the last 10 years: No If all of the above answers are "NO", then may proceed with Cephalo  . Sulfa Antibiotics Anaphylaxis, Shortness Of Breath and Other (See Comments)  . Tessalon [Benzonatate] Anaphylaxis  . Aztreonam Swelling    He appears to tolerate if you premedicate with steroids. Had facial/neck swelling without premedication  . Levaquin [Levofloxacin In D5w] Swelling    Hand and forearm swelling  . Rocephin [Ceftriaxone] Other (See Comments)    Confusion  . Shellfish Allergy Itching and Other (See Comments)    Took benadryl to alleviate reaction  . Miripirium Rash and Other (See Comments)  Change in mental status    Antimicrobials this admission: Vancomycin 11/20 >>11/21 Aztreonam 11/20 >>11/21 (allergy added, swelling of neck 11/21) Tigecycline 11/22 >> 11/24 Linezolid 11/19 >> 11/20; 11/21 >> Imipenem w/ premed 11/24 >> Eraxis 11/24 >>  Dose adjustments this admission: None  Microbiology results: 11/19 BCx x 3: Viridans strep - S ceftriaxone, eryth, levo, PCN, vanc 11/19UCx (suprapub cath): >100k col yeast; >100k col E.faecalis; 20k col acinetobacter calcoaceticus/baumannii (known to be colonized per ID) 11/20 MRSA PCR: positive 11/22 Sacral tissue culture: Abundant  gm neg rods, abundant gm positive cocci in pairs, few gm positive rods  11/23 TA: GPC in pairs, few gram variable rods, few yeast  Thank you for allowing pharmacy to be a part of this patient's care.  Aliza Moret L. Robson Trickey, PharmD Infectious Diseases Clinical Pharmacist Pager: [phone] 08/31/2016 12:19 PM

## 2016-08-31 NOTE — Procedures (Signed)
EEG Report  CLINICAL HISTORY This is a 29yo M who is in a comatose state.  There is concern for possible non-convulsive status epilepticus vs. Encephalopathy.  Patient is not listed as being on any sedation or AED.  TECHNIQUE This is a routine EEG done at the bedside using standard technique. Patient was unresponsive during the procedure.  Activating procedures were not done.  INTERPRETATION The background activity shows mostly suppression with occasional brief, very low amplitude activity.  Later in the recording the pattern changes to more burst-suppression type with the bursts of activity showing 4-5hz  frequency of low amplitude.  There was also artifact from background noise, occasional head and muscle activity.  No epileptiform activity was seen.  IMPRESSION This is an abnormal EEG due to severe generalized slowing.  No electrographic seizures were noted.  CLINICAL CORRELATION These findings are suggestive of severe encephalopathy but do not indicate the cause.  There is nothing to suggest non-convulsive status epilepticus at this time.  Valene Bors, Md Neurology

## 2016-08-31 NOTE — Progress Notes (Signed)
      INFECTIOUS DISEASE ATTENDING ADDENDUM:   Date: 08/31/2016  Patient name: Jason [name]  Medical record number: [id number]  Date of birth: [birth year]   Patient's platelets continue to trend down on Zyvox and no MRSA has been isolated from blood or other significant culture. Given the concern for potential anaphylactic reaction to antibiotics he was on and the worsening thrombocytopenia I'm discontinuing Zyvox.  We will go forward with imipenem and eraxis  Flea will turn the corner if he needs coverage for MRSA options could include vancomycin again versus double beta-lactam therapy by giving [facility] for MRSA coverage along with imipenem for his ESBL coverage. Clindamycin could also be used though is not as reliable for MRSA or staph aureus if there is inducible clindamycin resistance   [name] 08/31/2016, 8:14 PM

## 2016-08-31 NOTE — Progress Notes (Signed)
[facility] Progress Note Patient Name: JAHCARI STOLZENBURG DOB: Aug 10, 1986 MRN: [id number]   Date of Service  08/31/2016  HPI/Events of Note  agiatted per report. On camera: sleeping  eICU Interventions  fent prn     Intervention Category Major Interventions: Delirium, psychosis, severe agitation - evaluation and management  Verdene Creson 08/31/2016, 4:25 PM

## 2016-08-31 NOTE — Procedures (Signed)
Central Venous Catheter Insertion Procedure Note Jason [name] [id number] Sep 10, 1986  Procedure: Insertion of Central Venous Catheter Indications: Assessment of intravascular volume, Drug and/or fluid administration and Frequent blood sampling  Procedure Details Consent: Risks of procedure as well as the alternatives and risks of each were explained to the (patient/caregiver).  Consent for procedure obtained. Time Out: Verified patient identification, verified procedure, site/side was marked, verified correct patient position, special equipment/implants available, medications/allergies/relevent history reviewed, required imaging and test results available.  Performed  Maximum sterile technique was used including antiseptics, cap, gloves, gown, hand hygiene, mask and sheet. Skin prep: Chlorhexidine; local anesthetic administered A antimicrobial bonded/coated triple lumen catheter was placed in the right internal jugular vein using the Seldinger technique. Ultrasound guidance used.Yes.   Catheter placed to 17 cm. Blood aspirated via all 3 ports and then flushed x 3. Line sutured x 2 and dressing applied.  Evaluation Blood flow good Complications: No apparent complications Patient did tolerate procedure well. Chest X-ray ordered to verify placement.  CXR: pending.  Jason [name] Jason [name] ACNP Jason [name] PCCM Pager [phone] till 3 pm If no answer page [phone] 08/31/2016, 12:52 PM

## 2016-09-01 ENCOUNTER — Encounter (HOSPITAL_COMMUNITY): Payer: Self-pay | Admitting: Neurology

## 2016-09-01 DIAGNOSIS — J9601 Acute respiratory failure with hypoxia: Secondary | ICD-10-CM

## 2016-09-01 DIAGNOSIS — A491 Streptococcal infection, unspecified site: Secondary | ICD-10-CM

## 2016-09-01 DIAGNOSIS — J690 Pneumonitis due to inhalation of food and vomit: Secondary | ICD-10-CM

## 2016-09-01 LAB — GLUCOSE, CAPILLARY
GLUCOSE-CAPILLARY: 180 mg/dL — AB (ref 65–99)
GLUCOSE-CAPILLARY: 155 mg/dL — AB (ref 65–99)
GLUCOSE-CAPILLARY: 123 mg/dL — AB (ref 65–99)
GLUCOSE-CAPILLARY: 155 mg/dL — AB (ref 65–99)
Glucose-Capillary: 101 mg/dL — ABNORMAL HIGH (ref 65–99)
Glucose-Capillary: 124 mg/dL — ABNORMAL HIGH (ref 65–99)

## 2016-09-01 LAB — BASIC METABOLIC PANEL
ANION GAP: 8 (ref 5–15)
BUN: 32 mg/dL — ABNORMAL HIGH (ref 6–20)
CHLORIDE: 109 mmol/L (ref 101–111)
CO2: 23 mmol/L (ref 22–32)
CREATININE: 0.81 mg/dL (ref 0.61–1.24)
Calcium: 7.6 mg/dL — ABNORMAL LOW (ref 8.9–10.3)
GFR calc non Af Amer: >60 mL/min (ref >60)
Glucose, Bld: 189 mg/dL — ABNORMAL HIGH (ref 65–99)
POTASSIUM: 3.5 mmol/L (ref 3.5–5.1)
SODIUM: 140 mmol/L (ref 135–145)

## 2016-09-01 LAB — CBC
HEMATOCRIT: 27.3 % — AB (ref 39.0–52.0)
HEMOGLOBIN: 9.2 g/dL — AB (ref 13.0–17.0)
MCH: 26.7 pg (ref 26.0–34.0)
MCHC: 33.7 g/dL (ref 30.0–36.0)
MCV: 79.1 fL (ref 78.0–100.0)
Platelets: 93 10*3/uL — ABNORMAL LOW (ref 150–400)
RBC: 3.45 MIL/uL — AB (ref 4.22–5.81)
RDW: 17.3 % — ABNORMAL HIGH (ref 11.5–15.5)
WBC: 47.2 10*3/uL — AB (ref 4.0–10.5)

## 2016-09-01 LAB — POCT I-STAT 3, ART BLOOD GAS (G3+)
Bicarbonate: 24.1 mmol/L (ref 20.0–28.0)
O2 SAT: 98 %
PH ART: 7.442 (ref 7.350–7.450)
Patient temperature: 98.7
TCO2: 25 mmol/L (ref 0–100)
pCO2 arterial: 35.4 mmHg (ref 32.0–48.0)
pO2, Arterial: 108 mmHg (ref 83.0–108.0)

## 2016-09-01 LAB — MAGNESIUM: MAGNESIUM: 1.4 mg/dL — AB (ref 1.7–2.4)

## 2016-09-01 LAB — PHOSPHORUS: PHOSPHORUS: 3.1 mg/dL (ref 2.5–4.6)

## 2016-09-01 MED ORDER — CHLORHEXIDINE GLUCONATE 0.12% ORAL RINSE (MEDLINE KIT)
15.0000 mL | Freq: Two times a day (BID) | OROMUCOSAL | Status: DC
  Administered 2016-09-01 – 2016-09-10 (×17): 15 mL via OROMUCOSAL

## 2016-09-01 MED ORDER — VANCOMYCIN HCL IN DEXTROSE 750-5 MG/150ML-% IV SOLN
750.0000 mg | INTRAVENOUS | Status: DC
  Administered 2016-09-01: 750 mg via INTRAVENOUS
  Filled 2016-09-01: qty 150

## 2016-09-01 MED ORDER — JEVITY 1.2 CAL PO LIQD
1000.0000 mL | ORAL | Status: DC
  Administered 2016-09-01: 08:00:00
  Administered 2016-09-04: 1000 mL
  Filled 2016-09-01 (×4): qty 1000

## 2016-09-01 MED ORDER — MAGNESIUM SULFATE 4 GM/100ML IV SOLN
4.0000 g | Freq: Once | INTRAVENOUS | Status: AC
  Administered 2016-09-01: 4 g via INTRAVENOUS
  Filled 2016-09-01: qty 100

## 2016-09-01 MED ORDER — ORAL CARE MOUTH RINSE
15.0000 mL | Freq: Four times a day (QID) | OROMUCOSAL | Status: DC
  Administered 2016-09-01 – 2016-09-11 (×33): 15 mL via OROMUCOSAL

## 2016-09-01 NOTE — Progress Notes (Signed)
Pharmacy Antibiotic Note  Jason [name] is a 30 y.o. male admitted on [id number] with very complicated ID history.  Pt has multiple antibiotic allergies making it very difficult to treat his infections. 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   Pt needs to be premedicated with steroids before receiving imipenem.  Dr. Tommy Medal wants to do high dose steroids since he is so sick in the ICU. Also, pt's SCr is elevated compared to his baseline but trending back down - SCr 0.81 (baseline ~0.3-0.5).    Bone tissue culture from 11/22 is growing K pneumo sensitive to primaxin and E faecalis sensitive to ampicillin and vancomycin. Will ampicillin allergy, will start vanc.  Plan: Start vancomycin 750mg  IV q24h Continue Eraxis 100 mg IV q24h Continue imipenem 500 mg IV q8h + premedicate with decadron 4 mg IV q8h F/u renal fx, monitor for dose adjustments F/u cultures, GOC, mental status VT at SS prn  Height: 5\' 8"  (172.7 cm) Weight: 108 lb (49 kg) IBW/kg (Calculated) : 68.4  Temp (24hrs), Avg:98.7 F (37.1 C), Min:97.6 F (36.4 C), Max:99.8 F (37.7 C)   Recent Labs Lab 08/28/2016 1055 08/30/16 0500 08/31/16 0003 08/31/16 0024 08/31/16 0250 08/31/16 0530 08/31/16 1500 08/31/16 1845 09/01/16 0400  WBC  --  52.9*  --  44.3*  --  80.8*  --  49.3* 47.2*  CREATININE  --  1.33*  --  1.28*  --  1.24 1.03  --  0.81  LATICACIDVEN 1.5  --  3.9*  --  3.1* 3.5* 3.3*  --   --     Estimated Creatinine Clearance: 90.7 mL/min (by C-G formula based on SCr of 0.81 mg/dL).    Allergies  Allergen Reactions  . Amikacin Sulfate Other (See Comments)    Seizure   . Cefuroxime Axetil Anaphylaxis    Tolerated cefepime 12/2015 - with Pepcid/Solu-Medrol  . Ertapenem Other (See Comments)    Rash and confusion-->tolerated Imipenem   . Morphine And Related Other (See Comments)    Changed mental status, confusion, headache, visual hallucination  . Nsaids  Itching and Other (See Comments)    Risk of bleeding, itching  . Penicillins Anaphylaxis and Other (See Comments)    Tolerated Imipenem; no reaction to 7 day course of amoxicillin in 2015 Has patient had a PCN reaction causing immediate rash, facial/tongue/throat swelling, SOB or lightheadedness with hypotension: Yes Has patient had a PCN reaction causing severe rash involving mucus membranes or skin necrosis: No Has patient had a PCN reaction that required hospitalization Yes Has patient had a PCN reaction occurring within the last 10 years: No If all of the above answers are "NO", then may proceed with Cephalo  . Sulfa Antibiotics Anaphylaxis, Shortness Of Breath and Other (See Comments)  . Tessalon [Benzonatate] Anaphylaxis  . Aztreonam Swelling    He appears to tolerate if you premedicate with steroids. Had facial/neck swelling without premedication  . Levaquin [Levofloxacin In D5w] Swelling    Hand and forearm swelling  . Rocephin [Ceftriaxone] Other (See Comments)    Confusion  . Shellfish Allergy Itching and Other (See Comments)    Took benadryl to alleviate reaction  . 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 Diona Foley, PharmD,  BCPS Clinical Pharmacist 09/01/2016 11:38 AM

## 2016-09-01 NOTE — Progress Notes (Signed)
[facility] Progress Note Patient Name: AARNAV NEGLIA DOB: 14-Feb-1986 MRN: [id number]   Date of Service  09/01/2016  HPI/Events of Note  hypomag  eICU Interventions  Mag replaced     Intervention Category Intermediate Interventions: Electrolyte abnormality - evaluation and management  DETERDING,ELIZABETH 09/01/2016, 4:41 AM

## 2016-09-01 NOTE — Progress Notes (Signed)
Subjective:  Patient is wide awake and agitated  Antibiotics:  Anti-infectives    Start     Dose/Rate Route Frequency Ordered Stop   09/01/16 1300  anidulafungin (ERAXIS) 100 mg in sodium chloride 0.9 % 100 mL IVPB     100 mg over 90 Minutes Intravenous Every 24 hours 08/31/16 1240     09/01/16 1200  vancomycin (VANCOCIN) IVPB 750 mg/150 ml premix     750 mg 150 mL/hr over 60 Minutes Intravenous Every 24 hours 09/01/16 1145     08/31/16 1700  imipenem-cilastatin (PRIMAXIN) 500 mg in sodium chloride 0.9 % 100 mL IVPB     500 mg 200 mL/hr over 30 Minutes Intravenous Every 8 hours 08/31/16 1240     08/31/16 1300  anidulafungin (ERAXIS) 200 mg in sodium chloride 0.9 % 200 mL IVPB     200 mg over 180 Minutes Intravenous  Once 08/31/16 1240 08/31/16 1810   09/03/2016 0557  tigecycline (TYGACIL) 50 mg in sodium chloride 0.9 % 100 mL IVPB  Status:  Discontinued     50 mg 200 mL/hr over 30 Minutes Intravenous Every 12 hours 08/28/16 1758 08/31/16 1151   08/28/16 1800  tigecycline (TYGACIL) 100 mg in sodium chloride 0.9 % 100 mL IVPB  Status:  Discontinued     100 mg 200 mL/hr over 30 Minutes Intravenous  Once 08/28/16 1758 08/31/16 1424   08/28/16 1300  linezolid (ZYVOX) IVPB 600 mg  Status:  Discontinued     600 mg 300 mL/hr over 60 Minutes Intravenous Every 12 hours 08/28/16 1239 08/31/16 2014   08/28/16 0200  aztreonam (AZACTAM) 1 g in dextrose 5 % 50 mL IVPB  Status:  Discontinued     1 g 100 mL/hr over 30 Minutes Intravenous Every 8 hours 08/27/16 1802 08/28/16 1232   08/27/16 2000  vancomycin (VANCOCIN) 500 mg in sodium chloride 0.9 % 100 mL IVPB  Status:  Discontinued     500 mg 100 mL/hr over 60 Minutes Intravenous Every 12 hours 08/27/16 1802 08/28/16 1239   08/27/16 1800  aztreonam (AZACTAM) 2 g in dextrose 5 % 50 mL IVPB     2 g 100 mL/hr over 30 Minutes Intravenous  Once 08/27/16 1634 08/27/16 1852   08/27/16 1600  levofloxacin (LEVAQUIN) IVPB 750 mg  Status:   Discontinued     750 mg 100 mL/hr over 90 Minutes Intravenous Every 24 hours 08/22/2016 1324 08/09/2016 1522   08/27/16 0000  vancomycin (VANCOCIN) 500 mg in sodium chloride 0.9 % 100 mL IVPB  Status:  Discontinued     500 mg 100 mL/hr over 60 Minutes Intravenous Every 12 hours 09/03/2016 1324 08/19/2016 1522   08/13/2016 2200  aztreonam (AZACTAM) 1 g in dextrose 5 % 50 mL IVPB  Status:  Discontinued     1 g 100 mL/hr over 30 Minutes Intravenous Every 8 hours 08/19/2016 1324 08/09/2016 1522   08/20/2016 1600  linezolid (ZYVOX) tablet 600 mg  Status:  Discontinued     600 mg Oral Every 12 hours 08/21/2016 1458 08/27/16 1347   08/28/2016 1300  levofloxacin (LEVAQUIN) IVPB 750 mg  Status:  Discontinued     750 mg 100 mL/hr over 90 Minutes Intravenous  Once 08/21/2016 1246 09/03/2016 1522   08/24/2016 1300  aztreonam (AZACTAM) 2 g in dextrose 5 % 50 mL IVPB  Status:  Discontinued     2 g 100 mL/hr over 30 Minutes Intravenous  Once 08/12/2016 1246 08/20/2016  1522   08/30/2016 1300  vancomycin (VANCOCIN) IVPB 1000 mg/200 mL premix  Status:  Discontinued     1,000 mg 200 mL/hr over 60 Minutes Intravenous  Once 08/17/2016 1246 08/14/2016 1522      Medications: Scheduled Meds: . anidulafungin  100 mg Intravenous Q24H  . budesonide (PULMICORT) nebulizer solution  0.5 mg Nebulization BID  . chlorhexidine gluconate (MEDLINE KIT)  15 mL Mouth Rinse BID  . imipenem-cilastatin  500 mg Intravenous Q8H   And  . dexamethasone  4 mg Intravenous Q8H  . insulin aspart  0-9 Units Subcutaneous Q4H  . ipratropium  0.5 mg Nebulization Q6H  . mouth rinse  15 mL Mouth Rinse QID  . midodrine  20 mg Per Tube TID WC  . oxybutynin  5 mg Per Tube BID  . pantoprazole sodium  40 mg Per Tube Daily  . scopolamine  1 patch Transdermal Q72H  . vancomycin  750 mg Intravenous Q24H   Continuous Infusions: . feeding supplement (JEVITY 1.2 CAL) 10 mL/hr at 09/01/16 0758  . norepinephrine (LEVOPHED) Adult infusion Stopped (08/31/16 0900)  .  sodium  bicarbonate  infusion 1000 mL 50 mL/hr at 09/01/16 1600   PRN Meds:.acetaminophen, bisacodyl, fentaNYL (SUBLIMAZE) injection, naLOXone (NARCAN)  injection    Objective: Weight change:   Intake/Output Summary (Last 24 hours) at 09/01/16 1653 Last data filed at 09/01/16 1600  Gross per 24 hour  Intake          2554.91 ml  Output             2175 ml  Net           379.91 ml   Blood pressure 129/71, pulse 69, temperature 98 F (36.7 C), temperature source Oral, resp. rate (!) 23, height 5' 8"  (1.727 m), weight 108 lb (49 kg), SpO2 100 %. Temp:  [97.6 F (36.4 C)-99 F (37.2 C)] 98 F (36.7 C) (11/25 1613) Pulse Rate:  [38-92] 69 (11/25 1600) Resp:  [0-30] 23 (11/25 1600) BP: (129-141)/(71-77) 129/71 (11/25 1128) SpO2:  [89 %-100 %] 100 % (11/25 1605) Arterial Line BP: (113-153)/(57-86) 140/78 (11/25 1600) FiO2 (%):  [70 %-80 %] 70 % (11/25 1605)  Physical Exam: General: Jason [name] awake agitated HEENT: anicteric sclera, extraocular movement intact PERRL  CVS tachycardic rate, normal r,  no murmur rubs or gallops Chest: + rhonchi, less wheezing Abdomen: soft nondistended, normal bowel sounds, Extremities: Edematous in particular in his upper extremities face  Skin: Anterior decubitus ulcers are visible not posterior ones   Neuro: Jason [name] awke but confused  CBC:  CBC Latest Ref Rng & Units 09/01/2016 08/31/2016 08/31/2016  WBC 4.0 - 10.5 K/uL 47.2(H) 49.3(H) 80.8(HH)  Hemoglobin 13.0 - 17.0 g/dL 9.2(L) 9.2(L) 10.2(L)  Hematocrit 39.0 - 52.0 % 27.3(L) 27.1(L) 30.8(L)  Platelets 150 - 400 K/uL 93(L) 121(L) 145(L)      BMET  Recent Labs  08/31/16 1500 09/01/16 0400  NA 141 140  K 3.8 3.5  CL 111 109  CO2 23 23  GLUCOSE 138* 189*  BUN 38* 32*  CREATININE 1.03 0.81  CALCIUM 7.3* 7.6*     Liver Panel   Recent Labs  08/30/16 0500 08/31/16 0024  PROT 5.1* 3.7*  ALBUMIN 1.5* 1.2*  AST 15 26  ALT 12* 9*  ALKPHOS 124 132*  BILITOT 0.5 0.1*       Sedimentation  Rate No results for input(s): ESRSEDRATE in the last 72 hours. C-Reactive Protein No results for input(s): CRP in the  last 72 hours.  Micro Results: Recent Results (from the past 720 hour(s))  Urine culture     Status: Abnormal   Collection Time: 08/09/16  9:05 PM  Result Value Ref Range Status   Specimen Description URINE, RANDOM  Final   Special Requests NONE  Final   Culture MULTIPLE SPECIES PRESENT, SUGGEST RECOLLECTION (A)  Final   Report Status 08/11/2016 FINAL  Final  Blood Culture (routine x 2)     Status: None   Collection Time: 08/09/16 10:30 PM  Result Value Ref Range Status   Specimen Description BLOOD LEFT HAND  Final   Special Requests IN PEDIATRIC BOTTLE 1CC  Final   Culture NO GROWTH 5 DAYS  Final   Report Status 08/14/2016 FINAL  Final  Blood Culture (routine x 2)     Status: None   Collection Time: 08/09/16 10:45 PM  Result Value Ref Range Status   Specimen Description BLOOD RIGHT HAND  Final   Special Requests IN PEDIATRIC BOTTLE 2ML  Final   Culture NO GROWTH 5 DAYS  Final   Report Status 08/14/2016 FINAL  Final  Culture, blood (Routine X 2) w Reflex to ID Panel     Status: None   Collection Time: 08/11/16 11:40 AM  Result Value Ref Range Status   Specimen Description BLOOD LEFT ANTECUBITAL  Final   Special Requests IN PEDIATRIC BOTTLE 1CC  Final   Culture NO GROWTH 5 DAYS  Final   Report Status 08/16/2016 FINAL  Final  Culture, blood (Routine X 2) w Reflex to ID Panel     Status: None   Collection Time: 08/11/16 11:45 AM  Result Value Ref Range Status   Specimen Description BLOOD BLOOD LEFT HAND  Final   Special Requests IN PEDIATRIC BOTTLE 3CC  Final   Culture NO GROWTH 5 DAYS  Final   Report Status 08/16/2016 FINAL  Final  Urine culture     Status: Abnormal   Collection Time: 08/22/2016 12:04 PM  Result Value Ref Range Status   Specimen Description URINE, SUPRAPUBIC  Final   Special Requests NONE  Final   Culture (A)  Final    >=100,000 COLONIES/mL  ENTEROCOCCUS FAECALIS >=100,000 COLONIES/mL YEAST 20,000 COLONIES/mL ACINETOBACTER CALCOACETICUS/BAUMANNII COMPLEX    Report Status 09/04/2016 FINAL  Final   Organism ID, Bacteria ENTEROCOCCUS FAECALIS (A)  Final   Organism ID, Bacteria ACINETOBACTER CALCOACETICUS/BAUMANNII COMPLEX (A)  Final      Susceptibility   Acinetobacter calcoaceticus/baumannii complex - MIC*    CEFTAZIDIME >=64 RESISTANT Resistant     CEFTRIAXONE >=64 RESISTANT Resistant     CIPROFLOXACIN >=4 RESISTANT Resistant     GENTAMICIN 4 SENSITIVE Sensitive     IMIPENEM 1 SENSITIVE Sensitive     PIP/TAZO <=4 SENSITIVE Sensitive     TRIMETH/SULFA >=320 RESISTANT Resistant     CEFEPIME >=64 RESISTANT Resistant     AMPICILLIN/SULBACTAM 4 SENSITIVE Sensitive     * 20,000 COLONIES/mL ACINETOBACTER CALCOACETICUS/BAUMANNII COMPLEX   Enterococcus faecalis - MIC*    AMPICILLIN <=2 SENSITIVE Sensitive     LEVOFLOXACIN >=8 RESISTANT Resistant     NITROFURANTOIN <=16 SENSITIVE Sensitive     VANCOMYCIN 1 SENSITIVE Sensitive     * >=100,000 COLONIES/mL ENTEROCOCCUS FAECALIS  Blood Culture (routine x 2)     Status: Abnormal   Collection Time: 08/14/2016 12:07 PM  Result Value Ref Range Status   Specimen Description BLOOD LEFT WRIST  Final   Special Requests BOTTLES DRAWN AEROBIC AND ANAEROBIC 10CC  Final  Culture  Setup Time   Final    IN BOTH AEROBIC AND ANAEROBIC BOTTLES GRAM POSITIVE COCCI IN PAIRS AND CHAINS CRITICAL RESULT CALLED TO, READ BACK BY AND VERIFIED WITH: ANDY JOHNSTON,PHARMD @0013  08/28/16 MKELLY,MLT    Culture STREPTOCOCCUS CONSTELLATUS (A)  Final   Report Status 08/31/2016 FINAL  Final   Organism ID, Bacteria STREPTOCOCCUS CONSTELLATUS  Final      Susceptibility   Streptococcus constellatus - MIC*    PENICILLIN <=0.06 SENSITIVE Sensitive     CEFTRIAXONE 0.25 SENSITIVE Sensitive     ERYTHROMYCIN <=0.12 SENSITIVE Sensitive     LEVOFLOXACIN <=0.25 SENSITIVE Sensitive     VANCOMYCIN 0.25 SENSITIVE Sensitive       * STREPTOCOCCUS CONSTELLATUS  Blood Culture ID Panel (Reflexed)     Status: Abnormal   Collection Time: 08/13/2016 12:07 PM  Result Value Ref Range Status   Enterococcus species NOT DETECTED NOT DETECTED Final   Listeria monocytogenes NOT DETECTED NOT DETECTED Final   Staphylococcus species NOT DETECTED NOT DETECTED Final   Staphylococcus aureus NOT DETECTED NOT DETECTED Final   Streptococcus species DETECTED (A) NOT DETECTED Final    Comment: CRITICAL RESULT CALLED TO, READ BACK BY AND VERIFIED WITH: ANDY JOHNSTON,PHARMD @0013  08/28/16 MKELLY,MLT    Streptococcus agalactiae NOT DETECTED NOT DETECTED Final   Streptococcus pneumoniae NOT DETECTED NOT DETECTED Final   Streptococcus pyogenes NOT DETECTED NOT DETECTED Final   Acinetobacter baumannii NOT DETECTED NOT DETECTED Final   Enterobacteriaceae species NOT DETECTED NOT DETECTED Final   Enterobacter cloacae complex NOT DETECTED NOT DETECTED Final   Escherichia coli NOT DETECTED NOT DETECTED Final   Klebsiella oxytoca NOT DETECTED NOT DETECTED Final   Klebsiella pneumoniae NOT DETECTED NOT DETECTED Final   Proteus species NOT DETECTED NOT DETECTED Final   Serratia marcescens NOT DETECTED NOT DETECTED Final   Haemophilus influenzae NOT DETECTED NOT DETECTED Final   Neisseria meningitidis NOT DETECTED NOT DETECTED Final   Pseudomonas aeruginosa NOT DETECTED NOT DETECTED Final   Candida albicans NOT DETECTED NOT DETECTED Final   Candida glabrata NOT DETECTED NOT DETECTED Final   Candida krusei NOT DETECTED NOT DETECTED Final   Candida parapsilosis NOT DETECTED NOT DETECTED Final   Candida tropicalis NOT DETECTED NOT DETECTED Final  Culture, blood (x 2)     Status: Abnormal   Collection Time: 08/17/2016  6:10 PM  Result Value Ref Range Status   Specimen Description BLOOD RIGHT HAND  Final   Special Requests IN PEDIATRIC BOTTLE 2CC  Final   Culture  Setup Time   Final    GRAM POSITIVE COCCI IN PAIRS IN CHAINS IN PEDIATRIC  BOTTLE CRITICAL RESULT CALLED TO, READ BACK BY AND VERIFIED WITH: VERONDA BRYK,PHARMD @0206  08/19/2016 MKELLY,MLT    Culture (A)  Final    STREPTOCOCCUS CONSTELLATUS SUSCEPTIBILITIES PERFORMED ON PREVIOUS CULTURE WITHIN THE LAST 5 DAYS.    Report Status 08/31/2016 FINAL  Final  Culture, blood (x 2)     Status: Abnormal   Collection Time: 09/01/2016  6:14 PM  Result Value Ref Range Status   Specimen Description BLOOD RIGHT THUMB  Final   Special Requests IN PEDIATRIC BOTTLE 1CC  Final   Culture  Setup Time   Final    GRAM POSITIVE COCCI IN PAIRS IN PEDIATRIC BOTTLE CRITICAL VALUE NOTED.  VALUE IS CONSISTENT WITH PREVIOUSLY REPORTED AND CALLED VALUE.    Culture (A)  Final    STREPTOCOCCUS CONSTELLATUS SUSCEPTIBILITIES PERFORMED ON PREVIOUS CULTURE WITHIN THE LAST 5 DAYS.  Report Status 08/31/2016 FINAL  Final  MRSA PCR Screening     Status: Abnormal   Collection Time: 08/27/16  3:50 AM  Result Value Ref Range Status   MRSA by PCR POSITIVE (A) NEGATIVE Final    Comment:        The GeneXpert MRSA Assay (FDA approved for NASAL specimens only), is one component of a comprehensive MRSA colonization surveillance program. It is not intended to diagnose MRSA infection nor to guide or monitor treatment for MRSA infections. RESULT CALLED TO, READ BACK BY AND VERIFIED WITH: EVERETTE,D RN 08/27/16 AT 0515 SKEEN,P   Aerobic/Anaerobic Culture (surgical/deep wound)     Status: None (Preliminary result)   Collection Time: 08/28/2016  4:01 PM  Result Value Ref Range Status   Specimen Description TISSUE BONE  Final   Special Requests SACRAL  Final   Gram Stain   Final    ABUNDANT WBC PRESENT, PREDOMINANTLY PMN ABUNDANT GRAM NEGATIVE RODS ABUNDANT GRAM POSITIVE COCCI IN PAIRS FEW GRAM POSITIVE RODS    Culture   Final    MODERATE KLEBSIELLA PNEUMONIAE Confirmed Extended Spectrum Beta-Lactamase Producer (ESBL) MODERATE ENTEROCOCCUS FAECALIS MODERATE STREPTOCOCCUS CONSTELLATUS HOLDING FOR  POSSIBLE ANAEROBE    Report Status PENDING  Incomplete   Organism ID, Bacteria KLEBSIELLA PNEUMONIAE  Final   Organism ID, Bacteria ENTEROCOCCUS FAECALIS  Final      Susceptibility   Enterococcus faecalis - MIC*    AMPICILLIN <=2 SENSITIVE Sensitive     VANCOMYCIN 1 SENSITIVE Sensitive     GENTAMICIN SYNERGY SENSITIVE Sensitive     * MODERATE ENTEROCOCCUS FAECALIS   Klebsiella pneumoniae - MIC*    AMPICILLIN >=32 RESISTANT Resistant     CEFAZOLIN >=64 RESISTANT Resistant     CEFEPIME >=64 RESISTANT Resistant     CEFTAZIDIME >=64 RESISTANT Resistant     CEFTRIAXONE >=64 RESISTANT Resistant     CIPROFLOXACIN 2 INTERMEDIATE Intermediate     GENTAMICIN >=16 RESISTANT Resistant     IMIPENEM <=0.25 SENSITIVE Sensitive     TRIMETH/SULFA >=320 RESISTANT Resistant     AMPICILLIN/SULBACTAM >=32 RESISTANT Resistant     PIP/TAZO >=128 RESISTANT Resistant     Extended ESBL POSITIVE Resistant     * MODERATE KLEBSIELLA PNEUMONIAE  Culture, blood (Routine X 2) w Reflex to ID Panel     Status: None (Preliminary result)   Collection Time: 08/30/16  9:30 AM  Result Value Ref Range Status   Specimen Description BLOOD LEFT ANTECUBITAL  Final   Special Requests BOTTLES DRAWN AEROBIC ONLY 5CC  Final   Culture NO GROWTH 2 DAYS  Final   Report Status PENDING  Incomplete  Culture, blood (Routine X 2) w Reflex to ID Panel     Status: None (Preliminary result)   Collection Time: 08/30/16  9:35 AM  Result Value Ref Range Status   Specimen Description BLOOD BLOOD LEFT ARM  Final   Special Requests BOTTLES DRAWN AEROBIC ONLY 5CC  Final   Culture NO GROWTH 2 DAYS  Final   Report Status PENDING  Incomplete  Culture, respiratory (NON-Expectorated)     Status: None (Preliminary result)   Collection Time: 08/30/16  3:56 PM  Result Value Ref Range Status   Specimen Description TRACHEAL ASPIRATE  Final   Special Requests NONE  Final   Gram Stain   Final    ABUNDANT WBC PRESENT,BOTH PMN AND  MONONUCLEAR MODERATE GRAM POSITIVE COCCI IN PAIRS FEW GRAM VARIABLE ROD FEW YEAST    Culture   Final  MODERATE KLEBSIELLA PNEUMONIAE MODERATE ACINETOBACTER CALCOACETICUS/BAUMANNII COMPLEX SUSCEPTIBILITIES TO FOLLOW    Report Status PENDING  Incomplete  Culture, respiratory (NON-Expectorated)     Status: None (Preliminary result)   Collection Time: 08/31/16  1:20 AM  Result Value Ref Range Status   Specimen Description TRACHEAL ASPIRATE  Final   Special Requests NONE  Final   Gram Stain   Final    ABUNDANT WBC PRESENT,BOTH PMN AND MONONUCLEAR NO ORGANISMS SEEN    Culture   Final    MODERATE ACINETOBACTER CALCOACETICUS/BAUMANNII COMPLEX   Report Status PENDING  Incomplete    Studies/Results: US Abdomen Complete  Result Date: 08/31/2016 CLINICAL DATA:  Sepsis. Decreased blood pressure tonight. Patient is on ventilator. History of diabetes, partial quadriplegic, stroke. EXAM: ABDOMEN ULTRASOUND COMPLETE COMPARISON:  CT abdomen and pelvis 09/05/2016 FINDINGS: Gallbladder: Small stones in the gallbladder neck. Diffuse sludge in the gallbladder. No gallbladder wall thickening or edema. Murphy's sign is negative. Common bile duct: Diameter: 5.3 mm, normal Liver: No focal lesion identified. Within normal limits in parenchymal echogenicity. IVC: The IVC filter is noted. Pancreas: Limited visualization due to overlying bowel gas. Spleen: Size and appearance within normal limits. Right Kidney: Length: 10.1 cm. Increased parenchymal echotexture suggesting medical renal disease. No mass or hydronephrosis visualized. Left Kidney: Length: 10.9 cm. Increased parenchymal echotexture suggesting medical renal disease. No mass or hydronephrosis visualized. Abdominal aorta: No aneurysm visualized. Other findings: Free fluid around the liver and in the right lower quadrant and left lower quadrant consistent with ascites. Small bilateral pleural effusion. IMPRESSION: Small stones in the gallbladder neck with  sludge in the gallbladder. No wall thickening or edema. Increased parenchymal echotexture in the kidneys suggesting medical renal disease. Diffuse ascites with small bilateral pleural effusion. Electronically Signed   By: Lucienne Capers M.D.   On: 08/31/2016 05:19   Dg Chest Port 1 View  Result Date: 08/31/2016 CLINICAL DATA:  Status post central line placement EXAM: PORTABLE CHEST 1 VIEW COMPARISON:  08/31/2016 FINDINGS: Cardiac shadow is stable. Diffuse bilateral infiltrates are again identified predominately in the upper lobes. Tracheostomy tube and right-sided PICC line are again seen and stable. A new right jugular central line is noted with the tip in the mid superior vena cava. No pneumothorax is noted. IMPRESSION: Status post right jugular central line placement with the tip in the mid superior vena cava. No pneumothorax is noted. The remainder of the exam is stable from the previous study. Electronically Signed   By: Inez Catalina M.D.   On: 08/31/2016 13:13   Dg Chest Port 1 View  Result Date: 08/31/2016 CLINICAL DATA:  Respiratory failure. EXAM: PORTABLE CHEST 1 VIEW COMPARISON:  08/31/2016. FINDINGS: Tracheostomy tube, right PICC line stable position. Heart size stable. Dense bilateral pulmonary infiltrates are again noted. Right lower lobe atelectasis. Bilateral pleural effusions again are noted inter stable. IMPRESSION: 1.  Lines and tubes in stable position. 2. Dense bilateral pulmonary infiltrates are again noted without interim improvement. Right lower lobe atelectasis. Unchanged small bilateral pleural effusions. Electronically Signed   By: Marcello Moores  Register   On: 08/31/2016 06:58   Dg Chest Port 1 View  Result Date: 08/31/2016 CLINICAL DATA:  Hypoxemia. EXAM: PORTABLE CHEST 1 VIEW COMPARISON:  08/30/2016 FINDINGS: Tracheostomy tip is 6.8 cm above the carina. Right PICC line tip is over the low SVC region. No pneumothorax. Normal heart size. Since the previous study, there is  increasing consolidation in both lungs, greater on the left. Small bilateral pleural effusions. IMPRESSION: Increasing consolidation  in both lungs, more prominent on the left. Small bilateral pleural effusions. Electronically Signed   By: Lucienne Capers M.D.   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 ([place])   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([place])   History of pulmonary embolism   DVT (deep venous thrombosis) ([name])   Severe protein-calorie malnutrition ([place])   Dysphagia, pharyngoesophageal phase   Decubitus ulcer of right perineal ischial region, stage 4 ([name])   Decubitus ulcer of left perineal ischial region, stage 4 ([name])   Sacral decubitus ulcer, stage IV ([name])   Multiple allergies   Recurrent UTI   Respiratory failure, chronic ([name])   Aspiration pneumonia of right lower lobe ([name])   Seizures ([name])   Anxiety state   Anemia of chronic disease   Hyponatremia   Encephalopathy   Other acute osteomyelitis, multiple sites ([place])   Lower GI bleed   Viridans streptococci infection   Coma of unknown cause ([name])   Septic shock ([name])   Anaphylactic syndrome   Acute hypoxemic respiratory failure ([name])    Jason [name] is a 30 y.o. male with  very complicated medical history due to his paralysis and extensive decubitus ulcers. He came into the hospital with bleeding from his stoma site but also found to be with septic shock from viridans group  streptococcal bacteremia. He has subsequent become encephalopathic. Antibiotics have been adjusted based on his multiple allergies. He is currently on Zyvox and tigecycline. Become obtunded in the last 72 hours despite removal of narcotics administration of Narcan and continued support. Been transferred to the inten intensive care unit and has continued to worsen. Blood pressure DETERIORATED sat but dramatic improvement w carbapenem, steroids  #1 Septic shock plus or minus her forms of shock including potentially anaphylactic reaction:  He is much better!   We added vanco this am due to enterococcus but amp sen so can narrow to IMI  Will leave eraxis for now but get rid of soon  #2 viridans group streptococcal bacteremia: He is easily covered for this with his current antibiotics   #3 sacral osteomyelitis this sounds to have deteriorated substantially based on Dr. Eusebio Friendly op note.  He is growing ESBLand AMP S enteroccus from cx  #4 compression fracture with lucency: Could be osteomyelitis as well right now he is fairly broadly covered with his current regimen he is not in a status to have an aspirate from the site.  #4 encephalopathy with up tended status, : see above discussion  Much better  #6 Facial edema and upper extremity edema: suspect due to an allergic rxn, very strange      LOS: 6 days   [name] 09/01/2016, 4:53 PM

## 2016-09-01 NOTE — Progress Notes (Signed)
NEURO HOSPITALIST CONSULT FOLLOW UP NOTE   Requestig physician: Hospitalist   Reason for Consult: Coma  History obtained from:  Patient   Chart  Patient and Chart  Patient admitted with sepsis.  Over the last 1-2d patient has become progressively less responsive.  The hx is obtained from the mother and chart review.  The mother thinks he became worse after starting on abx.  She states this has happened in the past; they have placed the patient on high dose steroids when starting abx.  This time it was not done and she thinks that's the reason.  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  He has a trach and his respiratory status has worsened and has been placed back on the vent.  Interval history: -MRI pending  -More interactive per nursing -On broad spectrum antibiotics  -Na 141  Past Medical History:  Diagnosis Date  . Anemia   . Anxiety   . Arthritis   . Asthma   . Chronic osteomyelitis involving pelvic region and thigh ([place]) 06/18/2016  . Colostomy in place [facility])   . Complication of anesthesia    difficulty waking up, will have sudden drop in BP and O2 sats  . Depression   . Diabetic neuropathy ([place])   . Diabetic retinopathy ([place])   . Dysrhythmia    usually due to medications  . Family history of anesthesia complication    Pt mother can't have epidural procedures  . Fibromyalgia   . Gastroparesis   . GERD (gastroesophageal reflux disease)   . H/O constipation   . H/O respiratory failure   . H/O thrombosis   . Hx MRSA infection    on face  . Multiple allergies 11/02/2015  . Pneumonia   . Recurrent UTI 11/02/2015  . Seizures ([name]) 2017   one time during admission in hosptital  . Stroke ([place]) 01/29/2013   spinal stroke ; paraplegic   . Syncope 02/16/2015  . Tracheostomy present ([place])   . Type I diabetes mellitus ([place])    sees Dr. Loanne Drilling     Past Surgical History:  Procedure Laterality Date  . APPLICATION OF A-CELL OF BACK  N/A 11/12/2014   Procedure: APPLICATION A CELL AND VAC ;  Surgeon: Theodoro Kos, DO;  Location: [place];  Service: Plastics;  Laterality: N/A;  . APPLICATION OF A-CELL OF BACK N/A 12/08/2014   Procedure: APPLICATION OF A-CELL AND WOUND VAC ;  Surgeon: Theodoro Kos, DO;  Location: [place];  Service: Plastics;  Laterality: N/A;  . APPLICATION OF A-CELL OF CHEST/ABDOMEN N/A 08/04/2015   Procedure: APPLICATION OF A-CELL ;  Surgeon: Loel Lofty Dillingham, DO;  Location: [place];  Service: Plastics;  Laterality: N/A;  . COLOSTOMY    . DEBRIDMENT OF DECUBITUS ULCER N/A 10/04/2014   Procedure: DEBRIDMENT OF DECUBITUS ULCER;  Surgeon: Georganna Skeans, MD;  Location: [place];  Service: General;  Laterality: N/A;  . GASTROSTOMY TUBE PLACEMENT    . HEMATOMA EVACUATION N/A 05/05/2015   Procedure: EVACUATION HEMATOMA bedside procedure;  Surgeon: Theodoro Kos, DO;  Location: [place];  Service: Plastics;  Laterality: N/A;  . INCISION AND DRAINAGE OF WOUND N/A 11/12/2014   Procedure: IRRIGATION AND DEBRIDEMENT OF WOUNDS WITH BONE BIOPSY AND SURGICAL PREP ;  Surgeon: Theodoro Kos, DO;  Location: [place];  Service: Plastics;  Laterality: N/A;  . INCISION AND DRAINAGE OF  WOUND N/A 11/18/2014   Procedure: IRRIGATION AND DEBRIDEMENT OF SACRAL ULCER ONLY WITH PLACEMENT OF A CELL AND VAC/ DRESSING CHANGE TO UPPER BACK AREA.;  Surgeon: Theodoro Kos, DO;  Location: [place];  Service: Plastics;  Laterality: N/A;  . INCISION AND DRAINAGE OF WOUND N/A 11/25/2014   Procedure: IRRIGATION AND DEBRIDEMENT OF SACRAL ULCER AND BACK BURN WITH PLACEMENT OF A-CELL;  Surgeon: Theodoro Kos, DO;  Location: [place];  Service: Plastics;  Laterality: N/A;  . INCISION AND DRAINAGE OF WOUND Right 12/08/2014   Procedure: IRRIGATION AND DEBRIDEMENT SACRAL WOUND AND RIGHT ISCHIAL WOUND ;  Surgeon: Theodoro Kos, DO;  Location: [place];  Service: Plastics;  Laterality: Right;  . INCISION AND DRAINAGE OF WOUND Bilateral 05/05/2015   Procedure: IRRIGATION AND DEBRIDEMENT  SACRAL ULCER;  Surgeon: Theodoro Kos, DO;  Location: [place];  Service: Plastics;  Laterality: Bilateral;  . INCISION AND DRAINAGE OF WOUND N/A 08/04/2015   Procedure: IRRIGATION AND DEBRIDEMENT OF SACRAL ULCER AND ;  Surgeon: Loel Lofty Dillingham, DO;  Location: [place];  Service: Plastics;  Laterality: N/A;  . INCISION AND DRAINAGE OF WOUND N/A 08/19/2016   Procedure: IRRIGATION AND DEBRIDEMENT SACRAL ULCER;  Surgeon: Loel Lofty Dillingham, DO;  Location: [place];  Service: Plastics;  Laterality: N/A;  [place]  . INSERTION OF SUPRAPUBIC CATHETER N/A 10/12/2014   Procedure: INSERTION OF SUPRAPUBIC CATHETER;  Surgeon: Reece Packer, MD;  Location: [place];  Service: Urology;  Laterality: N/A;  . IR GENERIC HISTORICAL  07/11/2016   IR CM INJ ANY COLONIC TUBE W/FLUORO 07/11/2016 Arne Cleveland, MD [facility]-INTERV RAD  . LAPAROSCOPIC DIVERTED COLOSTOMY N/A 10/12/2014   Procedure: LAPAROSCOPIC DIVERTING COLOSTOMY;  Surgeon: Donnie Mesa, MD;  Location: [place];  Service: General;  Laterality: N/A;  . MINOR APPLICATION OF WOUND VAC N/A 11/25/2014   Procedure:  WOUND VAC CHANGE;  Surgeon: Theodoro Kos, DO;  Location: [place];  Service: Plastics;  Laterality: N/A;  . MULTIPLE EXTRACTIONS WITH ALVEOLOPLASTY N/A 08/03/2014   Procedure: MULTIPLE EXTRACTIONS;  Surgeon: Gae Bon, DDS;  Location: [place];  Service: Oral Surgery;  Laterality: N/A;  . RADIOLOGY WITH ANESTHESIA N/A 07/05/2016   Procedure: RADIOLOGY WITH ANESTHESIA MRI OF PELVIS WITH AND WITHOUT;  Surgeon: Medication Radiologist, MD;  Location: [place];  Service: Radiology;  Laterality: N/A;  . TEE WITHOUT CARDIOVERSION N/A 08/17/2014   Procedure: TRANSESOPHAGEAL ECHOCARDIOGRAM (TEE);  Surgeon: Dorothy Spark, MD;  Location: [place];  Service: Cardiovascular;  Laterality: N/A;  . TEE WITHOUT CARDIOVERSION N/A 05/05/2015   Procedure: TRANSESOPHAGEAL ECHOCARDIOGRAM (TEE);  Surgeon: Lelon Perla, MD;  Location[facility] ENDOSCOPY;  Service: Cardiovascular;   Laterality: N/A;  . TONSILLECTOMY      Social History:  reports that he quit smoking about 1 years ago. His smoking use included Cigars and Cigarettes. He has a 12.00 pack-year smoking history. He has never used smokeless tobacco. He reports that he does not drink alcohol or use drugs.  Allergies  Allergen Reactions  . Amikacin Sulfate Other (See Comments)    Seizure   . Cefuroxime Axetil Anaphylaxis    Tolerated cefepime 12/2015 - with Pepcid/Solu-Medrol  . Ertapenem Other (See Comments)    Rash and confusion-->tolerated Imipenem   . Morphine And Related Other (See Comments)    Changed mental status, confusion, headache, visual hallucination  . Nsaids Itching and Other (See Comments)    Risk of bleeding, itching  . Penicillins Anaphylaxis and Other (See Comments)    Tolerated Imipenem; no  reaction to 7 day course of amoxicillin in 2015 Has patient had a PCN reaction causing immediate rash, facial/tongue/throat swelling, SOB or lightheadedness with hypotension: Yes Has patient had a PCN reaction causing severe rash involving mucus membranes or skin necrosis: No Has patient had a PCN reaction that required hospitalization Yes Has patient had a PCN reaction occurring within the last 10 years: No If all of the above answers are "NO", then may proceed with Cephalo  . Sulfa Antibiotics Anaphylaxis, Shortness Of Breath and Other (See Comments)  . Tessalon [Benzonatate] Anaphylaxis  . Aztreonam Swelling    He appears to tolerate if you premedicate with steroids. Had facial/neck swelling without premedication  . Levaquin [Levofloxacin In D5w] Swelling    Hand and forearm swelling  . Rocephin [Ceftriaxone] Other (See Comments)    Confusion  . Shellfish Allergy Itching and Other (See Comments)    Took benadryl to alleviate reaction  . Miripirium Rash and Other (See Comments)    Change in mental status    MEDICATIONS:                                                                                                                      I have reviewed the patient's current medications.   Blood pressure 129/71, pulse 71, temperature 98.3 F (36.8 C), temperature source Oral, resp. rate (!) 26, height 5\' 8"  (1.727 m), weight 49 kg (108 lb), SpO2 100 %.   Neurologic Examination:                                                                                                       Neurological Examination Mental Status: Able to signal that he wants off the vent. Inconsistently follows commands.  Cranial Nerves: II: blinks to threat bilaterally  III,IV, VI: ptosis not present, extra-ocular motions intact bilaterally V,VII: appears symmetric  VIII: hearing normal bilaterally IX,X: unable to assess  XI: bilateral shoulder shrug XII: midline tongue extension Motor: -Antigravity of BUE stronger on L compared to R   -No movement of BLE.  Sensory: grimmaces to pain stimulus of UE   Lab Results: Basic Metabolic Panel:  Recent Labs Lab 08/27/16 0950 08/28/16 0415 08/16/2016 0615 08/30/16 0500 08/31/16 0024 08/31/16 0530 08/31/16 1500 09/01/16 0400  NA 127* 126* 129* 133* 135 140 141 140  K 3.7 4.6 4.8 4.4 3.7 3.2* 3.8 3.5  CL 104 103 107 112* 115* 112* 111 109  CO2 20* 18* 16* 16* 14* 20* 23 23  GLUCOSE 22* 263* 244* 128* 104* 98 138* 189*  BUN 40* 42* 42*  47* 44* 43* 38* 32*  CREATININE 0.70 1.00 1.23 1.33* 1.28* 1.24 1.03 0.81  CALCIUM 8.4* 8.2* 8.0* 8.1* 7.7* 7.4* 7.3* 7.6*  MG 1.9 1.9 1.8  --   --   --   --  1.4*  PHOS <1.0* 3.3 4.0  --   --   --   --  3.1    Liver Function Tests:  Recent Labs Lab 08/27/16 0950 08/28/16 0415 08/22/2016 0615 08/30/16 0500 08/31/16 0024  AST 15 14* 11* 15 26  ALT 11* 12* 12* 12* 9*  ALKPHOS 100 117 116 124 132*  BILITOT 0.2* 0.4 0.3 0.5 0.1*  PROT 4.6* 5.4* 5.4* 5.1* 3.7*  ALBUMIN 1.4* 1.6* 1.5* 1.5* 1.2*    Recent Labs Lab 09/03/2016 1116  LIPASE 13    Recent Labs Lab 08/18/2016 1055  AMMONIA 39*     CBC:  Recent Labs Lab 08/27/2016 1116 08/27/16 0950  08/28/16 0415 08/16/2016 0615 08/30/16 0500 08/31/16 0024 08/31/16 0530 08/31/16 1845 09/01/16 0400  WBC 30.8* 29.1*  --  36.5* 34.1* 52.9* 44.3* 80.8* 49.3* 47.2*  NEUTROABS 27.4* 26.5*  --  31.7* 31.3*  --   --   --   --   --   HGB 7.9* 7.0*  < > 10.7* 11.0* 10.9* 10.7* 10.2* 9.2* 9.2*  HCT 24.6* 22.0*  < > 32.1* 33.2* 33.2* 32.8* 30.8* 27.1* 27.3*  MCV 83.1 83.0  --  81.3 81.8 82.8 82.6 82.6 79.5 79.1  PLT 503* 372  --  412* 351 305 197 145* 121* 93*  < > = values in this interval not displayed.  Cardiac Enzymes: No results for input(s): CKTOTAL, CKMB, CKMBINDEX, TROPONINI in the last 168 hours.  Lipid Panel: No results for input(s): CHOL, TRIG, HDL, CHOLHDL, VLDL, LDLCALC in the last 168 hours.  CBG:  Recent Labs Lab 08/31/16 1603 08/31/16 2002 08/31/16 2353 09/01/16 0358 09/01/16 0845  GLUCAP 138* 147* 193* 180* 155*    Microbiology: Results for orders placed or performed during the hospital encounter of 08/16/2016  Urine culture     Status: Abnormal   Collection Time: 08/17/2016 12:04 PM  Result Value Ref Range Status   Specimen Description URINE, SUPRAPUBIC  Final   Special Requests NONE  Final   Culture (A)  Final    >=100,000 COLONIES/mL ENTEROCOCCUS FAECALIS >=100,000 COLONIES/mL YEAST 20,000 COLONIES/mL ACINETOBACTER CALCOACETICUS/BAUMANNII COMPLEX    Report Status 08/21/2016 FINAL  Final   Organism ID, Bacteria ENTEROCOCCUS FAECALIS (A)  Final   Organism ID, Bacteria ACINETOBACTER CALCOACETICUS/BAUMANNII COMPLEX (A)  Final      Susceptibility   Acinetobacter calcoaceticus/baumannii complex - MIC*    CEFTAZIDIME >=64 RESISTANT Resistant     CEFTRIAXONE >=64 RESISTANT Resistant     CIPROFLOXACIN >=4 RESISTANT Resistant     GENTAMICIN 4 SENSITIVE Sensitive     IMIPENEM 1 SENSITIVE Sensitive     PIP/TAZO <=4 SENSITIVE Sensitive     TRIMETH/SULFA >=320 RESISTANT Resistant     CEFEPIME >=64  RESISTANT Resistant     AMPICILLIN/SULBACTAM 4 SENSITIVE Sensitive     * 20,000 COLONIES/mL ACINETOBACTER CALCOACETICUS/BAUMANNII COMPLEX   Enterococcus faecalis - MIC*    AMPICILLIN <=2 SENSITIVE Sensitive     LEVOFLOXACIN >=8 RESISTANT Resistant     NITROFURANTOIN <=16 SENSITIVE Sensitive     VANCOMYCIN 1 SENSITIVE Sensitive     * >=100,000 COLONIES/mL ENTEROCOCCUS FAECALIS  Blood Culture (routine x 2)     Status: Abnormal   Collection Time: 08/28/2016 12:07 PM  Result  Value Ref Range Status   Specimen Description BLOOD LEFT WRIST  Final   Special Requests BOTTLES DRAWN AEROBIC AND ANAEROBIC 10CC  Final   Culture  Setup Time   Final    IN BOTH AEROBIC AND ANAEROBIC BOTTLES GRAM POSITIVE COCCI IN PAIRS AND CHAINS CRITICAL RESULT CALLED TO, READ BACK BY AND VERIFIED WITH: ANDY JOHNSTON,PHARMD @0013  08/28/16 MKELLY,MLT    Culture STREPTOCOCCUS CONSTELLATUS (A)  Final   Report Status 08/31/2016 FINAL  Final   Organism ID, Bacteria STREPTOCOCCUS CONSTELLATUS  Final      Susceptibility   Streptococcus constellatus - MIC*    PENICILLIN <=0.06 SENSITIVE Sensitive     CEFTRIAXONE 0.25 SENSITIVE Sensitive     ERYTHROMYCIN <=0.12 SENSITIVE Sensitive     LEVOFLOXACIN <=0.25 SENSITIVE Sensitive     VANCOMYCIN 0.25 SENSITIVE Sensitive     * STREPTOCOCCUS CONSTELLATUS  Blood Culture ID Panel (Reflexed)     Status: Abnormal   Collection Time: 09/04/2016 12:07 PM  Result Value Ref Range Status   Enterococcus species NOT DETECTED NOT DETECTED Final   Listeria monocytogenes NOT DETECTED NOT DETECTED Final   Staphylococcus species NOT DETECTED NOT DETECTED Final   Staphylococcus aureus NOT DETECTED NOT DETECTED Final   Streptococcus species DETECTED (A) NOT DETECTED Final    Comment: CRITICAL RESULT CALLED TO, READ BACK BY AND VERIFIED WITH: ANDY JOHNSTON,PHARMD @0013  08/28/16 MKELLY,MLT    Streptococcus agalactiae NOT DETECTED NOT DETECTED Final   Streptococcus pneumoniae NOT DETECTED NOT  DETECTED Final   Streptococcus pyogenes NOT DETECTED NOT DETECTED Final   Acinetobacter baumannii NOT DETECTED NOT DETECTED Final   Enterobacteriaceae species NOT DETECTED NOT DETECTED Final   Enterobacter cloacae complex NOT DETECTED NOT DETECTED Final   Escherichia coli NOT DETECTED NOT DETECTED Final   Klebsiella oxytoca NOT DETECTED NOT DETECTED Final   Klebsiella pneumoniae NOT DETECTED NOT DETECTED Final   Proteus species NOT DETECTED NOT DETECTED Final   Serratia marcescens NOT DETECTED NOT DETECTED Final   Haemophilus influenzae NOT DETECTED NOT DETECTED Final   Neisseria meningitidis NOT DETECTED NOT DETECTED Final   Pseudomonas aeruginosa NOT DETECTED NOT DETECTED Final   Candida albicans NOT DETECTED NOT DETECTED Final   Candida glabrata NOT DETECTED NOT DETECTED Final   Candida krusei NOT DETECTED NOT DETECTED Final   Candida parapsilosis NOT DETECTED NOT DETECTED Final   Candida tropicalis NOT DETECTED NOT DETECTED Final  Culture, blood (x 2)     Status: Abnormal   Collection Time: 08/16/2016  6:10 PM  Result Value Ref Range Status   Specimen Description BLOOD RIGHT HAND  Final   Special Requests IN PEDIATRIC BOTTLE 2CC  Final   Culture  Setup Time   Final    GRAM POSITIVE COCCI IN PAIRS IN CHAINS IN PEDIATRIC BOTTLE CRITICAL RESULT CALLED TO, READ BACK BY AND VERIFIED WITH: VERONDA BRYK,PHARMD @0206  08/17/2016 MKELLY,MLT    Culture (A)  Final    STREPTOCOCCUS CONSTELLATUS SUSCEPTIBILITIES PERFORMED ON PREVIOUS CULTURE WITHIN THE LAST 5 DAYS.    Report Status 08/31/2016 FINAL  Final  Culture, blood (x 2)     Status: Abnormal   Collection Time: 09/02/2016  6:14 PM  Result Value Ref Range Status   Specimen Description BLOOD RIGHT THUMB  Final   Special Requests IN PEDIATRIC BOTTLE 1CC  Final   Culture  Setup Time   Final    GRAM POSITIVE COCCI IN PAIRS IN PEDIATRIC BOTTLE CRITICAL VALUE NOTED.  VALUE IS CONSISTENT WITH PREVIOUSLY REPORTED AND CALLED  VALUE.    Culture  (A)  Final    STREPTOCOCCUS CONSTELLATUS SUSCEPTIBILITIES PERFORMED ON PREVIOUS CULTURE WITHIN THE LAST 5 DAYS.    Report Status 08/31/2016 FINAL  Final  MRSA PCR Screening     Status: Abnormal   Collection Time: 08/27/16  3:50 AM  Result Value Ref Range Status   MRSA by PCR POSITIVE (A) NEGATIVE Final    Comment:        The GeneXpert MRSA Assay (FDA approved for NASAL specimens only), is one component of a comprehensive MRSA colonization surveillance program. It is not intended to diagnose MRSA infection nor to guide or monitor treatment for MRSA infections. RESULT CALLED TO, READ BACK BY AND VERIFIED WITH: EVERETTE,D RN 08/27/16 AT 0515 SKEEN,P   Aerobic/Anaerobic Culture (surgical/deep wound)     Status: None (Preliminary result)   Collection Time: 08/16/2016  4:01 PM  Result Value Ref Range Status   Specimen Description TISSUE BONE  Final   Special Requests SACRAL  Final   Gram Stain   Final    ABUNDANT WBC PRESENT, PREDOMINANTLY PMN ABUNDANT GRAM NEGATIVE RODS ABUNDANT GRAM POSITIVE COCCI IN PAIRS FEW GRAM POSITIVE RODS    Culture   Final    MODERATE KLEBSIELLA PNEUMONIAE Confirmed Extended Spectrum Beta-Lactamase Producer (ESBL) MODERATE ENTEROCOCCUS FAECALIS HOLDING FOR POSSIBLE ANAEROBE    Report Status PENDING  Incomplete   Organism ID, Bacteria KLEBSIELLA PNEUMONIAE  Final   Organism ID, Bacteria ENTEROCOCCUS FAECALIS  Final      Susceptibility   Enterococcus faecalis - MIC*    AMPICILLIN <=2 SENSITIVE Sensitive     VANCOMYCIN 1 SENSITIVE Sensitive     GENTAMICIN SYNERGY SENSITIVE Sensitive     * MODERATE ENTEROCOCCUS FAECALIS   Klebsiella pneumoniae - MIC*    AMPICILLIN >=32 RESISTANT Resistant     CEFAZOLIN >=64 RESISTANT Resistant     CEFEPIME >=64 RESISTANT Resistant     CEFTAZIDIME >=64 RESISTANT Resistant     CEFTRIAXONE >=64 RESISTANT Resistant     CIPROFLOXACIN 2 INTERMEDIATE Intermediate     GENTAMICIN >=16 RESISTANT Resistant     IMIPENEM  <=0.25 SENSITIVE Sensitive     TRIMETH/SULFA >=320 RESISTANT Resistant     AMPICILLIN/SULBACTAM >=32 RESISTANT Resistant     PIP/TAZO >=128 RESISTANT Resistant     Extended ESBL POSITIVE Resistant     * MODERATE KLEBSIELLA PNEUMONIAE  Culture, blood (Routine X 2) w Reflex to ID Panel     Status: None (Preliminary result)   Collection Time: 08/30/16  9:30 AM  Result Value Ref Range Status   Specimen Description BLOOD LEFT ANTECUBITAL  Final   Special Requests BOTTLES DRAWN AEROBIC ONLY 5CC  Final   Culture NO GROWTH 1 DAY  Final   Report Status PENDING  Incomplete  Culture, blood (Routine X 2) w Reflex to ID Panel     Status: None (Preliminary result)   Collection Time: 08/30/16  9:35 AM  Result Value Ref Range Status   Specimen Description BLOOD BLOOD LEFT ARM  Final   Special Requests BOTTLES DRAWN AEROBIC ONLY 5CC  Final   Culture NO GROWTH 1 DAY  Final   Report Status PENDING  Incomplete  Culture, respiratory (NON-Expectorated)     Status: None (Preliminary result)   Collection Time: 08/30/16  3:56 PM  Result Value Ref Range Status   Specimen Description TRACHEAL ASPIRATE  Final   Special Requests NONE  Final   Gram Stain   Final    ABUNDANT WBC PRESENT,BOTH PMN  AND MONONUCLEAR MODERATE GRAM POSITIVE COCCI IN PAIRS FEW GRAM VARIABLE ROD FEW YEAST    Culture MODERATE GRAM NEGATIVE RODS  Final   Report Status PENDING  Incomplete  Culture, respiratory (NON-Expectorated)     Status: None (Preliminary result)   Collection Time: 08/31/16  1:20 AM  Result Value Ref Range Status   Specimen Description TRACHEAL ASPIRATE  Final   Special Requests NONE  Final   Gram Stain   Final    ABUNDANT WBC PRESENT,BOTH PMN AND MONONUCLEAR NO ORGANISMS SEEN    Culture MODERATE GRAM NEGATIVE RODS  Final   Report Status PENDING  Incomplete   *Note: Due to a large number of results and/or encounters for the requested time period, some results have not been displayed. A complete set of results  can be found in Results Review.    Coagulation Studies: No results for input(s): LABPROT, INR in the last 72 hours.  Imaging: US Abdomen Complete  Result Date: 08/31/2016 CLINICAL DATA:  Sepsis. Decreased blood pressure tonight. Patient is on ventilator. History of diabetes, partial quadriplegic, stroke. EXAM: ABDOMEN ULTRASOUND COMPLETE COMPARISON:  CT abdomen and pelvis 08/28/2016 FINDINGS: Gallbladder: Small stones in the gallbladder neck. Diffuse sludge in the gallbladder. No gallbladder wall thickening or edema. Murphy's sign is negative. Common bile duct: Diameter: 5.3 mm, normal Liver: No focal lesion identified. Within normal limits in parenchymal echogenicity. IVC: The IVC filter is noted. Pancreas: Limited visualization due to overlying bowel gas. Spleen: Size and appearance within normal limits. Right Kidney: Length: 10.1 cm. Increased parenchymal echotexture suggesting medical renal disease. No mass or hydronephrosis visualized. Left Kidney: Length: 10.9 cm. Increased parenchymal echotexture suggesting medical renal disease. No mass or hydronephrosis visualized. Abdominal aorta: No aneurysm visualized. Other findings: Free fluid around the liver and in the right lower quadrant and left lower quadrant consistent with ascites. Small bilateral pleural effusion. IMPRESSION: Small stones in the gallbladder neck with sludge in the gallbladder. No wall thickening or edema. Increased parenchymal echotexture in the kidneys suggesting medical renal disease. Diffuse ascites with small bilateral pleural effusion. Electronically Signed   By: Lucienne Capers M.D.   On: 08/31/2016 05:19   Dg Chest Port 1 View  Result Date: 08/31/2016 CLINICAL DATA:  Status post central line placement EXAM: PORTABLE CHEST 1 VIEW COMPARISON:  08/31/2016 FINDINGS: Cardiac shadow is stable. Diffuse bilateral infiltrates are again identified predominately in the upper lobes. Tracheostomy tube and right-sided PICC line are  again seen and stable. A new right jugular central line is noted with the tip in the mid superior vena cava. No pneumothorax is noted. IMPRESSION: Status post right jugular central line placement with the tip in the mid superior vena cava. No pneumothorax is noted. The remainder of the exam is stable from the previous study. Electronically Signed   By: Inez Catalina M.D.   On: 08/31/2016 13:13   Dg Chest Port 1 View  Result Date: 08/31/2016 CLINICAL DATA:  Respiratory failure. EXAM: PORTABLE CHEST 1 VIEW COMPARISON:  08/31/2016. FINDINGS: Tracheostomy tube, right PICC line stable position. Heart size stable. Dense bilateral pulmonary infiltrates are again noted. Right lower lobe atelectasis. Bilateral pleural effusions again are noted inter stable. IMPRESSION: 1.  Lines and tubes in stable position. 2. Dense bilateral pulmonary infiltrates are again noted without interim improvement. Right lower lobe atelectasis. Unchanged small bilateral pleural effusions. Electronically Signed   By: Marcello Moores  Register   On: 08/31/2016 06:58   Dg Chest Port 1 View  Result Date: 08/31/2016 CLINICAL  DATA:  Hypoxemia. EXAM: PORTABLE CHEST 1 VIEW COMPARISON:  08/30/2016 FINDINGS: Tracheostomy tip is 6.8 cm above the carina. Right PICC line tip is over the low SVC region. No pneumothorax. Normal heart size. Since the previous study, there is increasing consolidation in both lungs, greater on the left. Small bilateral pleural effusions. IMPRESSION: Increasing consolidation in both lungs, more prominent on the left. Small bilateral pleural effusions. Electronically Signed   By: Lucienne Capers M.D.   On: 08/31/2016 00:23   EEG:  This is an abnormal EEG due to severe generalized slowing.  No electrographic seizures were noted. CLINICAL CORRELATION These findings are suggestive of severe encephalopathy but do not indicate the cause.  There is nothing to suggest non-convulsive status epilepticus at this  time.  Assessment/Plan:  1. Encephalopathy likely in the setting of severe sepsis. Pt is better at following commands.  -MRI brain pending to see if there was any anoxic injury   2. 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 Sepsis on broad spectrum antibiotics.   Loney Hering, D.O.  [facility] [phone]

## 2016-09-01 NOTE — Progress Notes (Signed)
Treyshawn is more alert, but agitated today. WBC decreased 47.2 from 80.8. Spoke to pt's mother, Mrs Takushi. She feels [place] is "coming around nicely". She is appreciative of palliative medicine call and consult but does not feel that she would like to meet at this time; "we are not there yet". Reiterated we are here for support whatever that may look like. Will sign off for now but please do not hesitate to call us if Sayid's clinical condition worsens and/or if his mother wishes to speak to [name]. Romona Curls, ANP

## 2016-09-01 NOTE — Progress Notes (Signed)
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 Started on Azactam without premedication and he has become lethargic and has mild swelling around trach without compromise. Solumedrol has been started and he is HD stable. Mother reports this is his usual reaction to Azactam. PCCM saw 11/21 and signed off.  Called back 11/23 for worsening lethargy and request for vent support.    SIGNIFICANT EVENTS  11/22>> surgical bx sacral bone/wound  STUDIES:  11/21 ct head >>neg MRI head 11/23>>>  ABx:  linezolid 11/19>>> 11/24 tigecycline 11/21>>> 11/24 Vanc >>11/25 Eraxis >>11/24 Imipenem >>11/24   MICRO:  BC x 2 11/23>>> Sacral bone 11/22>>> ESBL  SUBJECTIVE:  Increasing lethargy this am.  Mild hypoxia on ABG so placed on vent with improvement in mental status per RN.  Remains lethargic but arousable on vent. Now in ICU.   VITAL SIGNS: Temp:  [97.6 F (36.4 C)-99 F (37.2 C)] 97.8 F (36.6 C) (11/25 1237) Pulse Rate:  [38-100] 72 (11/25 1300) Resp:  [0-33] 28 (11/25 1300) BP: (106-141)/(65-77) 129/71 (11/25 1128) SpO2:  [89 %-100 %] 100 % (11/25 1300) Arterial Line BP: (113-153)/(57-83) 146/78 (11/25 1300) FiO2 (%):  [70 %-80 %] 70 % (11/25 1200)  PHYSICAL EXAMINATION: General:  Chronically ill appearing male, NAD on vent  Neuro:  Lethargic but arousable, quad HEENT:  Mm moist, no JVD, chronic trach #6 cuffed c/d  Cardiovascular:  s1s2 rrr Lungs:  resps even non labored on vent, scattered rhonchi  Abdomen:  Round, soft, dec BS. anasarcous Musculoskeletal:  Warm and dry, large sacral decub, dressed. anasarcous   Recent Labs Lab 08/31/16 0530 08/31/16 1500 09/01/16 0400  NA 140 141  140  K 3.2* 3.8 3.5  CL 112* 111 109  CO2 20* 23 23  BUN 43* 38* 32*  CREATININE 1.24 1.03 0.81  GLUCOSE 98 138* 189*    Recent Labs Lab 08/31/16 0530 08/31/16 1845 09/01/16 0400  HGB 10.2* 9.2* 9.2*  HCT 30.8* 27.1* 27.3*  WBC 80.8* 49.3* 47.2*  PLT 145* 121* 93*   US Abdomen Complete  Result Date: 08/31/2016 CLINICAL DATA:  Sepsis. Decreased blood pressure tonight. Patient is on ventilator. History of diabetes, partial quadriplegic, stroke. EXAM: ABDOMEN ULTRASOUND COMPLETE COMPARISON:  CT abdomen and pelvis 08/25/2016 FINDINGS: Gallbladder: Small stones in the gallbladder neck. Diffuse sludge in the gallbladder. No gallbladder wall thickening or edema. Murphy's sign is negative. Common bile duct: Diameter: 5.3 mm, normal Liver: No focal lesion identified. Within normal limits in parenchymal echogenicity. IVC: The IVC filter is noted. Pancreas: Limited visualization due to overlying bowel gas. Spleen: Size and appearance within normal limits. Right Kidney: Length: 10.1 cm. Increased parenchymal echotexture suggesting medical renal disease. No mass or hydronephrosis visualized. Left Kidney: Length: 10.9 cm. Increased parenchymal echotexture suggesting medical renal disease. No mass or hydronephrosis visualized. Abdominal aorta: No aneurysm visualized. Other findings: Free fluid around the liver and in the right lower quadrant and left lower quadrant consistent with ascites. Small bilateral pleural effusion. IMPRESSION: Small stones in the gallbladder neck with sludge in the gallbladder. No wall thickening or edema. Increased parenchymal echotexture in the kidneys suggesting medical renal disease. Diffuse ascites with small bilateral pleural effusion. Electronically Signed  By: Lucienne Capers M.D.   On: 08/31/2016 05:19   Dg Chest Port 1 View  Result Date: 08/31/2016 CLINICAL DATA:  Status post central line placement EXAM: PORTABLE CHEST 1 VIEW COMPARISON:  08/31/2016 FINDINGS: Cardiac  shadow is stable. Diffuse bilateral infiltrates are again identified predominately in the upper lobes. Tracheostomy tube and right-sided PICC line are again seen and stable. A new right jugular central line is noted with the tip in the mid superior vena cava. No pneumothorax is noted. IMPRESSION: Status post right jugular central line placement with the tip in the mid superior vena cava. No pneumothorax is noted. The remainder of the exam is stable from the previous study. Electronically Signed   By: Inez Catalina M.D.   On: 08/31/2016 13:13   Dg Chest Port 1 View  Result Date: 08/31/2016 CLINICAL DATA:  Respiratory failure. EXAM: PORTABLE CHEST 1 VIEW COMPARISON:  08/31/2016. FINDINGS: Tracheostomy tube, right PICC line stable position. Heart size stable. Dense bilateral pulmonary infiltrates are again noted. Right lower lobe atelectasis. Bilateral pleural effusions again are noted inter stable. IMPRESSION: 1.  Lines and tubes in stable position. 2. Dense bilateral pulmonary infiltrates are again noted without interim improvement. Right lower lobe atelectasis. Unchanged small bilateral pleural effusions. Electronically Signed   By: Marcello Moores  Register   On: 08/31/2016 06:58   Dg Chest Port 1 View  Result Date: 08/31/2016 CLINICAL DATA:  Hypoxemia. EXAM: PORTABLE CHEST 1 VIEW COMPARISON:  08/30/2016 FINDINGS: Tracheostomy tip is 6.8 cm above the carina. Right PICC line tip is over the low SVC region. No pneumothorax. Normal heart size. Since the previous study, there is increasing consolidation in both lungs, greater on the left. Small bilateral pleural effusions. IMPRESSION: Increasing consolidation in both lungs, more prominent on the left. Small bilateral pleural effusions. Electronically Signed   By: Lucienne Capers M.D.   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 No weaning today. On 80% Fio2 and PEEP of 10.  Cont abx per ID.  Will need ct scan of abd/chest once more stable.  Pulmonary hygiene as able  BD's   Severe sepsis/septic shock.  - multifactorial in setting multiple wounds, osteomyelitis +/- septic arthritis +/- HCAP.  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 Multifactorial but mostly 2/2 severe sepsis/septic shock since 11/22 PLAN - F/u on EEG. Will need MRI when more stable.   Start prn fentanyl (he was drowsy > obtunded last week)   Lower GI bleeding  Blood loss anemia  Diarrhea - improved  PLAN -  GI following  PPI  Trickle feed  AKI  Metabolic acidosis Lactic acidosis PLAN -  Dec bicarbonate drip to 50 mls/hr.   Critical care time with this patient : 30 minutes. Mother updated at bedside.  Monica Becton, MD 09/01/2016, 2:21 PM [facility] Pager [phone] After 3 pm or if no answer, call [phone]

## 2016-09-02 ENCOUNTER — Inpatient Hospital Stay (HOSPITAL_COMMUNITY): Payer: Medicaid Other

## 2016-09-02 DIAGNOSIS — R4182 Altered mental status, unspecified: Secondary | ICD-10-CM

## 2016-09-02 LAB — CBC
HCT: 28 % — ABNORMAL LOW (ref 39.0–52.0)
HEMOGLOBIN: 9.5 g/dL — AB (ref 13.0–17.0)
MCH: 26.7 pg (ref 26.0–34.0)
MCHC: 33.9 g/dL (ref 30.0–36.0)
MCV: 78.7 fL (ref 78.0–100.0)
Platelets: 57 10*3/uL — ABNORMAL LOW (ref 150–400)
RBC: 3.56 MIL/uL — AB (ref 4.22–5.81)
RDW: 16.9 % — ABNORMAL HIGH (ref 11.5–15.5)
WBC: 30.8 10*3/uL — ABNORMAL HIGH (ref 4.0–10.5)

## 2016-09-02 LAB — BASIC METABOLIC PANEL
Anion gap: 8 (ref 5–15)
Anion gap: 8 (ref 5–15)
BUN: 21 mg/dL — ABNORMAL HIGH (ref 6–20)
BUN: 22 mg/dL — ABNORMAL HIGH (ref 6–20)
CALCIUM: 7.6 mg/dL — AB (ref 8.9–10.3)
CHLORIDE: 107 mmol/L (ref 101–111)
CO2: 25 mmol/L (ref 22–32)
CO2: 24 mmol/L (ref 22–32)
CREATININE: 0.6 mg/dL — AB (ref 0.61–1.24)
Calcium: 8 mg/dL — ABNORMAL LOW (ref 8.9–10.3)
Chloride: 101 mmol/L (ref 101–111)
Creatinine, Ser: 0.56 mg/dL — ABNORMAL LOW (ref 0.61–1.24)
GFR calc non Af Amer: >60 mL/min (ref >60)
Glucose, Bld: 104 mg/dL — ABNORMAL HIGH (ref 65–99)
Glucose, Bld: 167 mg/dL — ABNORMAL HIGH (ref 65–99)
POTASSIUM: 3.2 mmol/L — AB (ref 3.5–5.1)
Potassium: 3.6 mmol/L (ref 3.5–5.1)
SODIUM: 140 mmol/L (ref 135–145)
SODIUM: 133 mmol/L — AB (ref 135–145)

## 2016-09-02 LAB — CULTURE, RESPIRATORY

## 2016-09-02 LAB — GLUCOSE, CAPILLARY
GLUCOSE-CAPILLARY: 114 mg/dL — AB (ref 65–99)
Glucose-Capillary: 82 mg/dL (ref 65–99)
Glucose-Capillary: 164 mg/dL — ABNORMAL HIGH (ref 65–99)
Glucose-Capillary: 153 mg/dL — ABNORMAL HIGH (ref 65–99)
Glucose-Capillary: 110 mg/dL — ABNORMAL HIGH (ref 65–99)

## 2016-09-02 LAB — PHOSPHORUS: PHOSPHORUS: 3.3 mg/dL (ref 2.5–4.6)

## 2016-09-02 LAB — MAGNESIUM: MAGNESIUM: 2 mg/dL (ref 1.7–2.4)

## 2016-09-02 LAB — CULTURE, RESPIRATORY W GRAM STAIN

## 2016-09-02 MED ORDER — METOPROLOL TARTRATE 5 MG/5ML IV SOLN
5.0000 mg | INTRAVENOUS | Status: DC | PRN
  Administered 2016-09-02 (×2): 5 mg via INTRAVENOUS
  Filled 2016-09-02 (×2): qty 5

## 2016-09-02 MED ORDER — MIDAZOLAM HCL 2 MG/2ML IJ SOLN
2.0000 mg | INTRAMUSCULAR | Status: DC | PRN
  Administered 2016-09-03: 2 mg via INTRAVENOUS
  Filled 2016-09-02: qty 2

## 2016-09-02 MED ORDER — SODIUM BICARBONATE 650 MG PO TABS
650.0000 mg | ORAL_TABLET | Freq: Once | ORAL | Status: AC
  Administered 2016-09-02: 650 mg via ORAL
  Filled 2016-09-02: qty 1

## 2016-09-02 MED ORDER — PANCRELIPASE (LIP-PROT-AMYL) 12000-38000 UNITS PO CPEP
24000.0000 [IU] | ORAL_CAPSULE | Freq: Once | ORAL | Status: AC
  Administered 2016-09-02: 24000 [IU] via ORAL
  Filled 2016-09-02: qty 2

## 2016-09-02 MED ORDER — BACLOFEN 1 MG/ML ORAL SUSPENSION
10.0000 mg | Freq: Four times a day (QID) | ORAL | Status: DC
  Administered 2016-09-03 – 2016-09-06 (×13): 10 mg via ORAL
  Filled 2016-09-02 (×18): qty 1

## 2016-09-02 MED ORDER — SODIUM CHLORIDE 0.9 % IV SOLN
30.0000 meq | Freq: Once | INTRAVENOUS | Status: AC
  Administered 2016-09-02: 30 meq via INTRAVENOUS
  Filled 2016-09-02: qty 15

## 2016-09-02 MED ORDER — LORAZEPAM 2 MG/ML IJ SOLN
2.0000 mg | Freq: Once | INTRAMUSCULAR | Status: AC
  Administered 2016-09-02: 2 mg via INTRAVENOUS
  Filled 2016-09-02: qty 1

## 2016-09-02 NOTE — Progress Notes (Signed)
Unable to perform pt's trach care. Pt given 2mg  Ativan IV. pt swatting at nurse when attempted to perform trach care.

## 2016-09-02 NOTE — Progress Notes (Signed)
[facility] Progress Note Patient Name: Jason [name] DOB: 10/31/1985 MRN: [id number]   Date of Service  09/02/2016  HPI/Events of Note  PEG tube plugged and unable to unplug so far  eICU Interventions  Will need to replace if unable to unplug     Intervention Category Evaluation Type: Other  Mauri Brooklyn, P 09/02/2016, 9:44 PM

## 2016-09-02 NOTE — Progress Notes (Signed)
eLink Physician-Brief Progress Note Patient Name: Jason [name] DOB: 06-Feb-1986 MRN: [id number]   Date of Service  09/02/2016  HPI/Events of Note  Patient with elevated bp and ongoing pain issues.  eICU Interventions  Baclofen, home medication, restarted.       Intervention Category Evaluation Type: [address]  Mauri Brooklyn, P 09/02/2016, 6:23 PM

## 2016-09-02 NOTE — Progress Notes (Addendum)
Subjective:  Jason [name] was awake this  am but not responding to my commands he seemed a bit confused  Antibiotics:  Anti-infectives    Start     Dose/Rate Route Frequency Ordered Stop   09/01/16 1300  anidulafungin (ERAXIS) 100 mg in sodium chloride 0.9 % 100 mL IVPB     100 mg over 90 Minutes Intravenous Every 24 hours 08/31/16 1240     09/01/16 1200  vancomycin (VANCOCIN) IVPB 750 mg/150 ml premix  Status:  Discontinued     750 mg 150 mL/hr over 60 Minutes Intravenous Every 24 hours 09/01/16 1145 09/01/16 1712   08/31/16 1700  imipenem-cilastatin (PRIMAXIN) 500 mg in sodium chloride 0.9 % 100 mL IVPB     500 mg 200 mL/hr over 30 Minutes Intravenous Every 8 hours 08/31/16 1240     08/31/16 1300  anidulafungin (ERAXIS) 200 mg in sodium chloride 0.9 % 200 mL IVPB     200 mg over 180 Minutes Intravenous  Once 08/31/16 1240 08/31/16 1810   08/13/2016 0557  tigecycline (TYGACIL) 50 mg in sodium chloride 0.9 % 100 mL IVPB  Status:  Discontinued     50 mg 200 mL/hr over 30 Minutes Intravenous Every 12 hours 08/28/16 1758 08/31/16 1151   08/28/16 1800  tigecycline (TYGACIL) 100 mg in sodium chloride 0.9 % 100 mL IVPB  Status:  Discontinued     100 mg 200 mL/hr over 30 Minutes Intravenous  Once 08/28/16 1758 08/31/16 1424   08/28/16 1300  linezolid (ZYVOX) IVPB 600 mg  Status:  Discontinued     600 mg 300 mL/hr over 60 Minutes Intravenous Every 12 hours 08/28/16 1239 08/31/16 2014   08/28/16 0200  aztreonam (AZACTAM) 1 g in dextrose 5 % 50 mL IVPB  Status:  Discontinued     1 g 100 mL/hr over 30 Minutes Intravenous Every 8 hours 08/27/16 1802 08/28/16 1232   08/27/16 2000  vancomycin (VANCOCIN) 500 mg in sodium chloride 0.9 % 100 mL IVPB  Status:  Discontinued     500 mg 100 mL/hr over 60 Minutes Intravenous Every 12 hours 08/27/16 1802 08/28/16 1239   08/27/16 1800  aztreonam (AZACTAM) 2 g in dextrose 5 % 50 mL IVPB     2 g 100 mL/hr over 30 Minutes Intravenous  Once 08/27/16  1634 08/27/16 1852   08/27/16 1600  levofloxacin (LEVAQUIN) IVPB 750 mg  Status:  Discontinued     750 mg 100 mL/hr over 90 Minutes Intravenous Every 24 hours 08/19/2016 1324 08/25/2016 1522   08/27/16 0000  vancomycin (VANCOCIN) 500 mg in sodium chloride 0.9 % 100 mL IVPB  Status:  Discontinued     500 mg 100 mL/hr over 60 Minutes Intravenous Every 12 hours 08/24/2016 1324 08/23/2016 1522   08/31/2016 2200  aztreonam (AZACTAM) 1 g in dextrose 5 % 50 mL IVPB  Status:  Discontinued     1 g 100 mL/hr over 30 Minutes Intravenous Every 8 hours 08/27/2016 1324 09/04/2016 1522   08/14/2016 1600  linezolid (ZYVOX) tablet 600 mg  Status:  Discontinued     600 mg Oral Every 12 hours 08/25/2016 1458 08/27/16 1347   08/22/2016 1300  levofloxacin (LEVAQUIN) IVPB 750 mg  Status:  Discontinued     750 mg 100 mL/hr over 90 Minutes Intravenous  Once 08/21/2016 1246 09/03/2016 1522   08/08/2016 1300  aztreonam (AZACTAM) 2 g in dextrose 5 % 50 mL IVPB  Status:  Discontinued  2 g 100 mL/hr over 30 Minutes Intravenous  Once 08/08/2016 1246 08/31/2016 1522   08/17/2016 1300  vancomycin (VANCOCIN) IVPB 1000 mg/200 mL premix  Status:  Discontinued     1,000 mg 200 mL/hr over 60 Minutes Intravenous  Once 08/27/2016 1246 08/13/2016 1522      Medications: Scheduled Meds: . anidulafungin  100 mg Intravenous Q24H  . budesonide (PULMICORT) nebulizer solution  0.5 mg Nebulization BID  . chlorhexidine gluconate (MEDLINE KIT)  15 mL Mouth Rinse BID  . imipenem-cilastatin  500 mg Intravenous Q8H   And  . dexamethasone  4 mg Intravenous Q8H  . insulin aspart  0-9 Units Subcutaneous Q4H  . ipratropium  0.5 mg Nebulization Q6H  . mouth rinse  15 mL Mouth Rinse QID  . midodrine  20 mg Per Tube TID WC  . oxybutynin  5 mg Per Tube BID  . pantoprazole sodium  40 mg Per Tube Daily  . scopolamine  1 patch Transdermal Q72H   Continuous Infusions: . feeding supplement (JEVITY 1.2 CAL) 10 mL/hr at 09/01/16 0758  . norepinephrine (LEVOPHED) Adult  infusion Stopped (08/31/16 0900)  .  sodium bicarbonate  infusion 1000 mL 50 mL/hr at 09/02/16 1200   PRN Meds:.acetaminophen, bisacodyl, fentaNYL (SUBLIMAZE) injection, midazolam, midazolam, naLOXone (NARCAN)  injection    Objective: Weight change:   Intake/Output Summary (Last 24 hours) at 09/02/16 1248 Last data filed at 09/02/16 1211  Gross per 24 hour  Intake          2349.58 ml  Output             1665 ml  Net           684.58 ml   Blood pressure 129/71, pulse 77, temperature 98 F (36.7 C), temperature source Oral, resp. rate (!) 25, height 5' 8" (1.727 m), weight 122 lb (55.3 kg), SpO2 99 %. Temp:  [97.4 F (36.3 C)-98 F (36.7 C)] 98 F (36.7 C) (11/26 1152) Pulse Rate:  [66-77] 77 (11/26 1218) Resp:  [0-30] 25 (11/26 1218) SpO2:  [96 %-100 %] 99 % (11/26 1218) Arterial Line BP: (113-161)/(61-89) 154/85 (11/26 1200) FiO2 (%):  [40 %-70 %] 40 % (11/26 1218) Weight:  [122 lb (55.3 kg)] 122 lb (55.3 kg) (11/26 0500)  Physical Exam: General: pt awake Quiet but confused HEENT: anicteric sclera, extraocular movement intact PERRL  CVS tachycardic rate, normal r,  no murmur rubs or gallops Chest: + rhonchi, less wheezing Abdomen: soft nondistended, normal bowel sounds, Extremities: Edematous in particular in his upper extremities face  Skin: Anterior decubitus ulcers are visible not posterior ones   Neuro: pt awake but confused  CBC:  CBC Latest Ref Rng & Units 09/02/2016 09/01/2016 08/31/2016  WBC 4.0 - 10.5 K/uL 30.8(H) 47.2(H) 49.3(H)  Hemoglobin 13.0 - 17.0 g/dL 9.5(L) 9.2(L) 9.2(L)  Hematocrit 39.0 - 52.0 % 28.0(L) 27.3(L) 27.1(L)  Platelets 150 - 400 K/uL 57(L) 93(L) 121(L)      BMET  Recent Labs  09/01/16 0400 09/02/16 0523  NA 140 140  K 3.5 3.2*  CL 109 107  CO2 23 25  GLUCOSE 189* 104*  BUN 32* 21*  CREATININE 0.81 0.56*  CALCIUM 7.6* 8.0*     Liver Panel   Recent Labs  08/31/16 0024  PROT 3.7*  ALBUMIN 1.2*  AST 26  ALT 9*    ALKPHOS 132*  BILITOT 0.1*       Sedimentation Rate No results for input(s): ESRSEDRATE in the last 72 hours.  C-Reactive Protein No results for input(s): CRP in the last 72 hours.  Micro Results: Recent Results (from the past 720 hour(s))  Urine culture     Status: Abnormal   Collection Time: 08/09/16  9:05 PM  Result Value Ref Range Status   Specimen Description URINE, RANDOM  Final   Special Requests NONE  Final   Culture MULTIPLE SPECIES PRESENT, SUGGEST RECOLLECTION (A)  Final   Report Status 08/11/2016 FINAL  Final  Blood Culture (routine x 2)     Status: None   Collection Time: 08/09/16 10:30 PM  Result Value Ref Range Status   Specimen Description BLOOD LEFT HAND  Final   Special Requests IN PEDIATRIC BOTTLE 1CC  Final   Culture NO GROWTH 5 DAYS  Final   Report Status 08/14/2016 FINAL  Final  Blood Culture (routine x 2)     Status: None   Collection Time: 08/09/16 10:45 PM  Result Value Ref Range Status   Specimen Description BLOOD RIGHT HAND  Final   Special Requests IN PEDIATRIC BOTTLE 2ML  Final   Culture NO GROWTH 5 DAYS  Final   Report Status 08/14/2016 FINAL  Final  Culture, blood (Routine X 2) w Reflex to ID Panel     Status: None   Collection Time: 08/11/16 11:40 AM  Result Value Ref Range Status   Specimen Description BLOOD LEFT ANTECUBITAL  Final   Special Requests IN PEDIATRIC BOTTLE 1CC  Final   Culture NO GROWTH 5 DAYS  Final   Report Status 08/16/2016 FINAL  Final  Culture, blood (Routine X 2) w Reflex to ID Panel     Status: None   Collection Time: 08/11/16 11:45 AM  Result Value Ref Range Status   Specimen Description BLOOD BLOOD LEFT HAND  Final   Special Requests IN PEDIATRIC BOTTLE 3CC  Final   Culture NO GROWTH 5 DAYS  Final   Report Status 08/16/2016 FINAL  Final  Urine culture     Status: Abnormal   Collection Time: 09/04/2016 12:04 PM  Result Value Ref Range Status   Specimen Description URINE, SUPRAPUBIC  Final   Special Requests  NONE  Final   Culture (A)  Final    >=100,000 COLONIES/mL ENTEROCOCCUS FAECALIS >=100,000 COLONIES/mL YEAST 20,000 COLONIES/mL ACINETOBACTER CALCOACETICUS/BAUMANNII COMPLEX    Report Status 08/20/2016 FINAL  Final   Organism ID, Bacteria ENTEROCOCCUS FAECALIS (A)  Final   Organism ID, Bacteria ACINETOBACTER CALCOACETICUS/BAUMANNII COMPLEX (A)  Final      Susceptibility   Acinetobacter calcoaceticus/baumannii complex - MIC*    CEFTAZIDIME >=64 RESISTANT Resistant     CEFTRIAXONE >=64 RESISTANT Resistant     CIPROFLOXACIN >=4 RESISTANT Resistant     GENTAMICIN 4 SENSITIVE Sensitive     IMIPENEM 1 SENSITIVE Sensitive     PIP/TAZO <=4 SENSITIVE Sensitive     TRIMETH/SULFA >=320 RESISTANT Resistant     CEFEPIME >=64 RESISTANT Resistant     AMPICILLIN/SULBACTAM 4 SENSITIVE Sensitive     * 20,000 COLONIES/mL ACINETOBACTER CALCOACETICUS/BAUMANNII COMPLEX   Enterococcus faecalis - MIC*    AMPICILLIN <=2 SENSITIVE Sensitive     LEVOFLOXACIN >=8 RESISTANT Resistant     NITROFURANTOIN <=16 SENSITIVE Sensitive     VANCOMYCIN 1 SENSITIVE Sensitive     * >=100,000 COLONIES/mL ENTEROCOCCUS FAECALIS  Blood Culture (routine x 2)     Status: Abnormal   Collection Time: 09/04/2016 12:07 PM  Result Value Ref Range Status   Specimen Description BLOOD LEFT WRIST  Final   Special  Requests BOTTLES DRAWN AEROBIC AND ANAEROBIC 10CC  Final   Culture  Setup Time   Final    IN BOTH AEROBIC AND ANAEROBIC BOTTLES GRAM POSITIVE COCCI IN PAIRS AND CHAINS CRITICAL RESULT CALLED TO, READ BACK BY AND VERIFIED WITH: ANDY JOHNSTON,PHARMD _0  08/28/16 MKELLY,MLT    Culture STREPTOCOCCUS CONSTELLATUS (A)  Final   Report Status 08/31/2016 FINAL  Final   Organism ID, Bacteria STREPTOCOCCUS CONSTELLATUS  Final      Susceptibility   Streptococcus constellatus - MIC*    PENICILLIN <=0.06 SENSITIVE Sensitive     CEFTRIAXONE 0.25 SENSITIVE Sensitive     ERYTHROMYCIN <=0.12 SENSITIVE Sensitive     LEVOFLOXACIN <=0.25  SENSITIVE Sensitive     VANCOMYCIN 0.25 SENSITIVE Sensitive     * STREPTOCOCCUS CONSTELLATUS  Blood Culture ID Panel (Reflexed)     Status: Abnormal   Collection Time: 08/17/2016 12:07 PM  Result Value Ref Range Status   Enterococcus species NOT DETECTED NOT DETECTED Final   Listeria monocytogenes NOT DETECTED NOT DETECTED Final   Staphylococcus species NOT DETECTED NOT DETECTED Final   Staphylococcus aureus NOT DETECTED NOT DETECTED Final   Streptococcus species DETECTED (A) NOT DETECTED Final    Comment: CRITICAL RESULT CALLED TO, READ BACK BY AND VERIFIED WITH: ANDY JOHNSTON,PHARMD _1  08/28/16 MKELLY,MLT    Streptococcus agalactiae NOT DETECTED NOT DETECTED Final   Streptococcus pneumoniae NOT DETECTED NOT DETECTED Final   Streptococcus pyogenes NOT DETECTED NOT DETECTED Final   Acinetobacter baumannii NOT DETECTED NOT DETECTED Final   Enterobacteriaceae species NOT DETECTED NOT DETECTED Final   Enterobacter cloacae complex NOT DETECTED NOT DETECTED Final   Escherichia coli NOT DETECTED NOT DETECTED Final   Klebsiella oxytoca NOT DETECTED NOT DETECTED Final   Klebsiella pneumoniae NOT DETECTED NOT DETECTED Final   Proteus species NOT DETECTED NOT DETECTED Final   Serratia marcescens NOT DETECTED NOT DETECTED Final   Haemophilus influenzae NOT DETECTED NOT DETECTED Final   Neisseria meningitidis NOT DETECTED NOT DETECTED Final   Pseudomonas aeruginosa NOT DETECTED NOT DETECTED Final   Candida albicans NOT DETECTED NOT DETECTED Final   Candida glabrata NOT DETECTED NOT DETECTED Final   Candida krusei NOT DETECTED NOT DETECTED Final   Candida parapsilosis NOT DETECTED NOT DETECTED Final   Candida tropicalis NOT DETECTED NOT DETECTED Final  Culture, blood (x 2)     Status: Abnormal   Collection Time: 08/14/2016  6:10 PM  Result Value Ref Range Status   Specimen Description BLOOD RIGHT HAND  Final   Special Requests IN PEDIATRIC BOTTLE 2CC  Final   Culture  Setup Time   Final     GRAM POSITIVE COCCI IN PAIRS IN CHAINS IN PEDIATRIC BOTTLE CRITICAL RESULT CALLED TO, READ BACK BY AND VERIFIED WITH: VERONDA BRYK,PHARMD _2  09/05/2016 MKELLY,MLT    Culture (A)  Final    STREPTOCOCCUS CONSTELLATUS SUSCEPTIBILITIES PERFORMED ON PREVIOUS CULTURE WITHIN THE LAST 5 DAYS.    Report Status 08/31/2016 FINAL  Final  Culture, blood (x 2)     Status: Abnormal   Collection Time: 08/27/2016  6:14 PM  Result Value Ref Range Status   Specimen Description BLOOD RIGHT THUMB  Final   Special Requests IN PEDIATRIC BOTTLE 1CC  Final   Culture  Setup Time   Final    GRAM POSITIVE COCCI IN PAIRS IN PEDIATRIC BOTTLE CRITICAL VALUE NOTED.  VALUE IS CONSISTENT WITH PREVIOUSLY REPORTED AND CALLED VALUE.    Culture (A)  Final    STREPTOCOCCUS CONSTELLATUS SUSCEPTIBILITIES PERFORMED  ON PREVIOUS CULTURE WITHIN THE LAST 5 DAYS.    Report Status 08/31/2016 FINAL  Final  MRSA PCR Screening     Status: Abnormal   Collection Time: 08/27/16  3:50 AM  Result Value Ref Range Status   MRSA by PCR POSITIVE (A) NEGATIVE Final    Comment:        The GeneXpert MRSA Assay (FDA approved for NASAL specimens only), is one component of a comprehensive MRSA colonization surveillance program. It is not intended to diagnose MRSA infection nor to guide or monitor treatment for MRSA infections. RESULT CALLED TO, READ BACK BY AND VERIFIED WITH: EVERETTE,D RN 08/27/16 AT 0515 SKEEN,P   Aerobic/Anaerobic Culture (surgical/deep wound)     Status: None (Preliminary result)   Collection Time: 09/05/2016  4:01 PM  Result Value Ref Range Status   Specimen Description TISSUE BONE  Final   Special Requests SACRAL  Final   Gram Stain   Final    ABUNDANT WBC PRESENT, PREDOMINANTLY PMN ABUNDANT GRAM NEGATIVE RODS ABUNDANT GRAM POSITIVE COCCI IN PAIRS FEW GRAM POSITIVE RODS    Culture   Final    MODERATE KLEBSIELLA PNEUMONIAE Confirmed Extended Spectrum Beta-Lactamase Producer (ESBL) MODERATE ENTEROCOCCUS  FAECALIS MODERATE STREPTOCOCCUS CONSTELLATUS HOLDING FOR POSSIBLE ANAEROBE    Report Status PENDING  Incomplete   Organism ID, Bacteria KLEBSIELLA PNEUMONIAE  Final   Organism ID, Bacteria ENTEROCOCCUS FAECALIS  Final      Susceptibility   Enterococcus faecalis - MIC*    AMPICILLIN <=2 SENSITIVE Sensitive     VANCOMYCIN 1 SENSITIVE Sensitive     GENTAMICIN SYNERGY SENSITIVE Sensitive     * MODERATE ENTEROCOCCUS FAECALIS   Klebsiella pneumoniae - MIC*    AMPICILLIN >=32 RESISTANT Resistant     CEFAZOLIN >=64 RESISTANT Resistant     CEFEPIME >=64 RESISTANT Resistant     CEFTAZIDIME >=64 RESISTANT Resistant     CEFTRIAXONE >=64 RESISTANT Resistant     CIPROFLOXACIN 2 INTERMEDIATE Intermediate     GENTAMICIN >=16 RESISTANT Resistant     IMIPENEM <=0.25 SENSITIVE Sensitive     TRIMETH/SULFA >=320 RESISTANT Resistant     AMPICILLIN/SULBACTAM >=32 RESISTANT Resistant     PIP/TAZO >=128 RESISTANT Resistant     Extended ESBL POSITIVE Resistant     * MODERATE KLEBSIELLA PNEUMONIAE  Culture, blood (Routine X 2) w Reflex to ID Panel     Status: None (Preliminary result)   Collection Time: 08/30/16  9:30 AM  Result Value Ref Range Status   Specimen Description BLOOD LEFT ANTECUBITAL  Final   Special Requests BOTTLES DRAWN AEROBIC ONLY 5CC  Final   Culture NO GROWTH 3 DAYS  Final   Report Status PENDING  Incomplete  Culture, blood (Routine X 2) w Reflex to ID Panel     Status: None (Preliminary result)   Collection Time: 08/30/16  9:35 AM  Result Value Ref Range Status   Specimen Description BLOOD BLOOD LEFT ARM  Final   Special Requests BOTTLES DRAWN AEROBIC ONLY 5CC  Final   Culture NO GROWTH 3 DAYS  Final   Report Status PENDING  Incomplete  Culture, respiratory (NON-Expectorated)     Status: None   Collection Time: 08/30/16  3:56 PM  Result Value Ref Range Status   Specimen Description TRACHEAL ASPIRATE  Final   Special Requests NONE  Final   Gram Stain   Final    ABUNDANT WBC  PRESENT,BOTH PMN AND MONONUCLEAR MODERATE GRAM POSITIVE COCCI IN PAIRS FEW GRAM VARIABLE ROD FEW YEAST  Culture   Final    MODERATE KLEBSIELLA PNEUMONIAE MODERATE ACINETOBACTER CALCOACETICUS/BAUMANNII COMPLEX Confirmed Extended Spectrum Beta-Lactamase Producer (ESBL) for KLEBSIELLA PNEUMONIAE    Report Status 09/02/2016 FINAL  Final   Organism ID, Bacteria KLEBSIELLA PNEUMONIAE  Final   Organism ID, Bacteria ACINETOBACTER CALCOACETICUS/BAUMANNII COMPLEX  Final      Susceptibility   Acinetobacter calcoaceticus/baumannii complex - MIC*    CEFTAZIDIME >=64 RESISTANT Resistant     CEFTRIAXONE >=64 RESISTANT Resistant     CIPROFLOXACIN >=4 RESISTANT Resistant     GENTAMICIN 4 SENSITIVE Sensitive     IMIPENEM 1 SENSITIVE Sensitive     PIP/TAZO >=128 RESISTANT Resistant     TRIMETH/SULFA >=320 RESISTANT Resistant     CEFEPIME >=64 RESISTANT Resistant     AMPICILLIN/SULBACTAM 4 SENSITIVE Sensitive     * MODERATE ACINETOBACTER CALCOACETICUS/BAUMANNII COMPLEX   Klebsiella pneumoniae - MIC*    AMPICILLIN >=32 RESISTANT Resistant     CEFAZOLIN >=64 RESISTANT Resistant     CEFEPIME >=64 RESISTANT Resistant     CEFTAZIDIME 16 RESISTANT Resistant     CEFTRIAXONE >=64 RESISTANT Resistant     CIPROFLOXACIN 1 SENSITIVE Sensitive     GENTAMICIN >=16 RESISTANT Resistant     IMIPENEM <=0.25 SENSITIVE Sensitive     TRIMETH/SULFA >=320 RESISTANT Resistant     AMPICILLIN/SULBACTAM >=32 RESISTANT Resistant     PIP/TAZO 8 SENSITIVE Sensitive     Extended ESBL POSITIVE Resistant     * MODERATE KLEBSIELLA PNEUMONIAE  Culture, respiratory (NON-Expectorated)     Status: None (Preliminary result)   Collection Time: 08/31/16  1:20 AM  Result Value Ref Range Status   Specimen Description TRACHEAL ASPIRATE  Final   Special Requests NONE  Final   Gram Stain   Final    ABUNDANT WBC PRESENT,BOTH PMN AND MONONUCLEAR NO ORGANISMS SEEN    Culture   Final    MODERATE ACINETOBACTER CALCOACETICUS/BAUMANNII  COMPLEX   Report Status PENDING  Incomplete    Studies/Results: Dg Chest Port 1 View  Result Date: 09/02/2016 CLINICAL DATA:  30 year old male central line placement. Initial encounter. EXAM: PORTABLE CHEST 1 VIEW COMPARISON:  08/31/2016 and earlier. FINDINGS: Portable AP semi upright view at at 0607 hours. The right subclavian approach PICC line has been removed. A right IJ approach central line remains in place, an the tip projects just below the level of the carina at the lower SVC level. No pneumothorax. Stable tracheostomy tube. Regressed but not completely resolved right upper lobe confluent airspace opacity. Mildly improved widespread left lung airspace opacity, significant upper lung residual. There is evidence of a small layering right pleural effusion. Stable cardiac size and mediastinal contours. Percutaneous gastrostomy tube visible with paucity of bowel gas in the upper abdomen. Stable visualized osseous structures. IMPRESSION: 1. Right IJ central line tip at the level of the lower SVC. No pneumothorax. Right PICC line removed. 2. Improved bilateral upper lobe predominant airspace disease compatible with improving pneumonia, but significant residual left greater than right. Small right pleural effusion. Electronically Signed   By: Genevie Ann M.D.   On: 09/02/2016 06:46   Dg Chest Port 1 View  Result Date: 08/31/2016 CLINICAL DATA:  Status post central line placement EXAM: PORTABLE CHEST 1 VIEW COMPARISON:  08/31/2016 FINDINGS: Cardiac shadow is stable. Diffuse bilateral infiltrates are again identified predominately in the upper lobes. Tracheostomy tube and right-sided PICC line are again seen and stable. A new right jugular central line is noted with the tip in the mid superior vena cava.  No pneumothorax is noted. IMPRESSION: Status post right jugular central line placement with the tip in the mid superior vena cava. No pneumothorax is noted. The remainder of the exam is stable from the  previous study. Electronically Signed   By: Inez Catalina M.D.   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 ([place])   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([place])   History of pulmonary embolism   DVT (deep venous thrombosis) ([name])   Severe protein-calorie malnutrition ([place])   Dysphagia, pharyngoesophageal phase   Decubitus ulcer of right perineal ischial region, stage 4 ([place])   Decubitus ulcer of left perineal ischial region, stage 4 ([place])   Sacral decubitus ulcer, stage IV ([name])   Multiple allergies   Recurrent UTI   Respiratory failure, chronic ([name])   Aspiration pneumonia of right lower lobe ([name])   Seizures ([name])   Anxiety state   Anemia of chronic disease   Hyponatremia   Encephalopathy   Other acute osteomyelitis, multiple sites ([place])   Lower GI bleed   Viridans streptococci infection   Coma of unknown cause ([name])   Septic shock ([name])   Anaphylactic syndrome   Acute hypoxemic respiratory failure ([name])   Enterococcal infection    Jason [name] is a 31 y.o. male with  very complicated medical history due to his paralysis and extensive decubitus ulcers. He came into the hospital with bleeding from his stoma site but also found to be with septic shock from viridans group streptococcal bacteremia. He has subsequent become encephalopathic. Antibiotics have been adjusted based on his multiple allergies. He is currently on Zyvox and  tigecycline. Become obtunded in the last 72 hours despite removal of narcotics administration of Narcan and continued support. Been transferred to the inten intensive care unit and has continued to worsen. 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 Eusebio Friendly op note.  He is growing ESBL and AMP S enteroccus from cx ALT would be covered by his imipenem  #4 compression fracture with lucency: Could be osteomyelitis as well right now he is fairly broadly covered with his current regimen he is not in a status to have an aspirate from the site.  #4 encephalopathy : Much much better though when I saw him this morning is not back to baseline because he can interact and converse at baseline and is usually completely oriented  #6 Facial edema and upper extremity edema: suspect due to an allergic rxn, very strange case  Dr. Baxter Flattery is back tomorrow  LOS: 7 days   [name] 09/02/2016, 12:48 PM

## 2016-09-02 NOTE — Progress Notes (Signed)
[facility] Progress Note Patient Name: Jason [name] DOB: 09/14/1986 MRN: [id number]   Date of Service  09/02/2016  HPI/Events of Note  Call from bedside nurse that patient is anxious and distressed.  PRN fentanyl is not helping.  HD stable.   eICU Interventions  Plan: One time dose 2 mg Ativan IV. Additional medication options to be reviewed on AM rounds     Intervention Category Minor Interventions: Agitation / anxiety - evaluation and management  Ziza Hastings 09/02/2016, 5:02 AM

## 2016-09-02 NOTE — Progress Notes (Signed)
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 Started on Azactam without premedication and he has become lethargic and has mild swelling around trach without compromise. Solumedrol has been started and he is HD stable. Mother reports this is his usual reaction to Azactam. PCCM saw 11/21 and signed off.  Called back 11/23 for worsening lethargy and request for vent support.    SIGNIFICANT EVENTS  11/22>> surgical bx sacral bone/wound  STUDIES:  11/21 ct head >>neg MRI head 11/23>>>  ABx:  linezolid 11/19>>> 11/24 tigecycline 11/21>>> 11/24 Vanc >>11/25 Eraxis >>11/24 Imipenem >>11/24   MICRO:  BC x 2 11/23>>> Sacral bone 11/22>>> ESBL  SUBJECTIVE:   More "awake and trying to converse (baseiline) On and off agitation.    VITAL SIGNS: Temp:  [97.4 F (36.3 C)-98.3 F (36.8 C)] 98.3 F (36.8 C) (11/26 1633) Pulse Rate:  [66-80] 70 (11/26 1900) Resp:  [0-30] 23 (11/26 1900) SpO2:  [91 %-100 %] 97 % (11/26 1900) Arterial Line BP: (113-161)/(61-91) 154/91 (11/26 1900) FiO2 (%):  [40 %-70 %] 40 % (11/26 1600) Weight:  [55.3 kg (122 lb)] 55.3 kg (122 lb) (11/26 0500)  PHYSICAL EXAMINATION: General:  Chronically ill appearing male, NAD on vent  Neuro:  Lethargic but arousable, quad HEENT:  Mm moist, no JVD, chronic trach #6 cuffed c/d  Cardiovascular:  s1s2 rrr Lungs:  resps even non labored on vent, scattered rhonchi  Abdomen:  Round, soft, dec BS. anasarcous Musculoskeletal:  Warm and dry, large sacral decub, dressed. anasarcous   Recent Labs Lab 09/01/16 0400 09/02/16 0523 09/02/16 1214  NA 140 140 133*  K 3.5 3.2* 3.6  CL 109 107 101  CO2 23 25 24   BUN 32* 21* 22*    CREATININE 0.81 0.56* 0.60*  GLUCOSE 189* 104* 167*    Recent Labs Lab 08/31/16 1845 09/01/16 0400 09/02/16 0523  HGB 9.2* 9.2* 9.5*  HCT 27.1* 27.3* 28.0*  WBC 49.3* 47.2* 30.8*  PLT 121* 93* 57*   Dg Chest Port 1 View  Result Date: 09/02/2016 CLINICAL DATA:  30 year old male central line placement. Initial encounter. EXAM: PORTABLE CHEST 1 VIEW COMPARISON:  08/31/2016 and earlier. FINDINGS: Portable AP semi upright view at at 0607 hours. The right subclavian approach PICC line has been removed. A right IJ approach central line remains in place, an the tip projects just below the level of the carina at the lower SVC level. No pneumothorax. Stable tracheostomy tube. Regressed but not completely resolved right upper lobe confluent airspace opacity. Mildly improved widespread left lung airspace opacity, significant upper lung residual. There is evidence of a small layering right pleural effusion. Stable cardiac size and mediastinal contours. Percutaneous gastrostomy tube visible with paucity of bowel gas in the upper abdomen. Stable visualized osseous structures. IMPRESSION: 1. Right IJ central line tip at the level of the lower SVC. No pneumothorax. Right PICC line removed. 2. Improved bilateral upper lobe predominant airspace disease compatible with improving pneumonia, but significant residual left greater than right. Small right pleural effusion. Electronically Signed   By: Genevie Ann M.D.   On: 09/02/2016 06:46    ASSESSMENT / PLAN:  Acute on chronic trach dependent respiratory failure Acute Hypoxemic resp  failure 2/2 shock/severe sepsis/ concerned for HCAP and Pulm edema Chronic trach  Previous chronic vent - has not required qhs vent in ~8 months per mom  PLAN -  Vent support - 8cc/kg. No weaning today. On 80% Fio2 and PEEP of 10. Cut down Fio2 Cont abx per ID.  Will need ct scan of abd/chest once more stable. As he is clinically improved, holding off for now.  Pulmonary hygiene as  able  BD's   Severe sepsis/septic shock.  - multifactorial in setting multiple wounds, osteomyelitis +/- septic arthritis +/- HCAP.  Pt with K pneumo and E faecalis in bone culture Pt with strep viridans bacteremia Adrenal insufficiency Chronic hypotension  PLAN -  abx as mentioned -  Imipenem. 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 Multifactorial but mostly 2/2 severe sepsis/septic shock since 11/22 PLAN - F/u on EEG.  Hold off MRI as pt's mental status seems baseline now.  Start pain meds and psych meds.  Lower GI bleeding  Blood loss anemia  Diarrhea - improved  PLAN -  GI following  PPI  Trickle feed > advance as tolerated.   AKI  Metabolic acidosis Lactic acidosis PLAN -  Improved. Will d/c naHCo3 drip. On TF.   Critical care time with this patient : 30 minutes. Mother updated on 11/25.      Monica Becton, MD 09/02/2016, 7:43 PM [facility] Pager [phone] After 3 pm or if no answer, call [phone]

## 2016-09-02 NOTE — Progress Notes (Signed)
Subjective: encephalopathy, sepsis 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Nursing staff reports that the patient is back to baseline as trach and PEG   Objective: Current vital signs: BP 129/71   Pulse 69   Temp 97.4 F (36.3 C) (Oral)   Resp (!) 8   Ht 5' 8"  (1.727 m)   Wt 55.3 kg (122 lb)   SpO2 98%   BMI 18.55 kg/m  Vital signs in last 24 hours: Temp:  [97.4 F (36.3 C)-98 F (36.7 C)] 97.4 F (36.3 C) (11/26 0826) Pulse Rate:  [66-77] 69 (11/26 1000) Resp:  [0-30] 8 (11/26 1000) BP: (129)/(71) 129/71 (11/25 1128) SpO2:  [96 %-100 %] 98 % (11/26 1000) Arterial Line BP: (113-161)/(61-89) 143/81 (11/26 1000) FiO2 (%):  [40 %-70 %] 40 % (11/26 0836) Weight:  [55.3 kg (122 lb)] 55.3 kg (122 lb) (11/26 0500)    Neurologic Exam: Neurological Examination Mental Status: Able to signal that he wants off the vent. Inconsistently follows commands.  Cranial Nerves: II: blinks to threat bilaterally  III,IV, VI: ptosis not present, extra-ocular motions intact bilaterally V,VII: appears symmetric  VIII: hearing normal bilaterally IX,X: unable to assess  XI: bilateral shoulder shrug XII: midline tongue extension Motor: -Antigravity of BUE stronger on L compared to R   -No movement of BLE.  Sensory: grimmaces to pain stimulus of UE    Lab Results: Basic Metabolic Panel:  Recent Labs Lab 08/27/16 0950 08/28/16 0415 08/28/2016 0615  08/31/16 0024 08/31/16 0530 08/31/16 1500 09/01/16 0400 09/02/16 0523  NA 127* 126* 129*  < > 135 140 141 140 140  K 3.7 4.6 4.8  < > 3.7 3.2* 3.8 3.5 3.2*  CL 104 103 107  < > 115* 112* 111 109 107  CO2 20* 18* 16*  < > 14* 20* 23 23 25   GLUCOSE 22* 263* 244*  < > 104* 98 138* 189* 104*  BUN 40* 42* 42*  < > 44* 43* 38* 32* 21*  CREATININE 0.70 1.00 1.23  < > 1.28* 1.24 1.03 0.81 0.56*  CALCIUM 8.4* 8.2* 8.0*  < > 7.7* 7.4* 7.3* 7.6* 8.0*  MG 1.9 1.9 1.8  --   --   --   --  1.4* 2.0  PHOS <1.0* 3.3 4.0  --   --   --   --  3.1 3.3  < > = values in this  interval not displayed.  Liver Function Tests:  Recent Labs Lab 08/27/16 0950 08/28/16 0415 09/04/2016 0615 08/30/16 0500 08/31/16 0024  AST 15 14* 11* 15 26  ALT 11* 12* 12* 12* 9*  ALKPHOS 100 117 116 124 132*  BILITOT 0.2* 0.4 0.3 0.5 0.1*  PROT 4.6* 5.4* 5.4* 5.1* 3.7*  ALBUMIN 1.4* 1.6* 1.5* 1.5* 1.2*    Recent Labs Lab 08/12/2016 1116  LIPASE 13    Recent Labs Lab 08/21/2016 1055  AMMONIA 39*    CBC:  Recent Labs Lab 08/13/2016 1116 08/27/16 0950  08/28/16 0415 09/01/2016 0615  08/31/16 0024 08/31/16 0530 08/31/16 1845 09/01/16 0400 09/02/16 0523  WBC 30.8* 29.1*  --  36.5* 34.1*  < > 44.3* 80.8* 49.3* 47.2* 30.8*  NEUTROABS 27.4* 26.5*  --  31.7* 31.3*  --   --   --   --   --   --   HGB 7.9* 7.0*  < > 10.7* 11.0*  < > 10.7* 10.2* 9.2* 9.2* 9.5*  HCT 24.6* 22.0*  < > 32.1* 33.2*  < > 32.8* 30.8* 27.1* 27.3* 28.0*  MCV  83.1 83.0  --  81.3 81.8  < > 82.6 82.6 79.5 79.1 78.7  PLT 503* 372  --  412* 351  < > 197 145* 121* 93* 57*  < > = values in this interval not displayed.  Cardiac Enzymes: No results for input(s): CKTOTAL, CKMB, CKMBINDEX, TROPONINI in the last 168 hours.  Lipid Panel: No results for input(s): CHOL, TRIG, HDL, CHOLHDL, VLDL, LDLCALC in the last 168 hours.  CBG:  Recent Labs Lab 09/01/16 1615 09/01/16 1955 09/01/16 2356 09/02/16 0445 09/02/16 0824  GLUCAP 123* 155* 124* 82 114*    Microbiology: Results for orders placed or performed during the hospital encounter of 09/03/2016  Urine culture     Status: Abnormal   Collection Time: 08/13/2016 12:04 PM  Result Value Ref Range Status   Specimen Description URINE, SUPRAPUBIC  Final   Special Requests NONE  Final   Culture (A)  Final    >=100,000 COLONIES/mL ENTEROCOCCUS FAECALIS >=100,000 COLONIES/mL YEAST 20,000 COLONIES/mL ACINETOBACTER CALCOACETICUS/BAUMANNII COMPLEX    Report Status 08/14/2016 FINAL  Final   Organism ID, Bacteria ENTEROCOCCUS FAECALIS (A)  Final   Organism ID,  Bacteria ACINETOBACTER CALCOACETICUS/BAUMANNII COMPLEX (A)  Final      Susceptibility   Acinetobacter calcoaceticus/baumannii complex - MIC*    CEFTAZIDIME >=64 RESISTANT Resistant     CEFTRIAXONE >=64 RESISTANT Resistant     CIPROFLOXACIN >=4 RESISTANT Resistant     GENTAMICIN 4 SENSITIVE Sensitive     IMIPENEM 1 SENSITIVE Sensitive     PIP/TAZO <=4 SENSITIVE Sensitive     TRIMETH/SULFA >=320 RESISTANT Resistant     CEFEPIME >=64 RESISTANT Resistant     AMPICILLIN/SULBACTAM 4 SENSITIVE Sensitive     * 20,000 COLONIES/mL ACINETOBACTER CALCOACETICUS/BAUMANNII COMPLEX   Enterococcus faecalis - MIC*    AMPICILLIN <=2 SENSITIVE Sensitive     LEVOFLOXACIN >=8 RESISTANT Resistant     NITROFURANTOIN <=16 SENSITIVE Sensitive     VANCOMYCIN 1 SENSITIVE Sensitive     * >=100,000 COLONIES/mL ENTEROCOCCUS FAECALIS  Blood Culture (routine x 2)     Status: Abnormal   Collection Time: 08/27/2016 12:07 PM  Result Value Ref Range Status   Specimen Description BLOOD LEFT WRIST  Final   Special Requests BOTTLES DRAWN AEROBIC AND ANAEROBIC 10CC  Final   Culture  Setup Time   Final    IN BOTH AEROBIC AND ANAEROBIC BOTTLES GRAM POSITIVE COCCI IN PAIRS AND CHAINS CRITICAL RESULT CALLED TO, READ BACK BY AND VERIFIED WITH: ANDY JOHNSTON,PHARMD @0013  08/28/16 MKELLY,MLT    Culture STREPTOCOCCUS CONSTELLATUS (A)  Final   Report Status 08/31/2016 FINAL  Final   Organism ID, Bacteria STREPTOCOCCUS CONSTELLATUS  Final      Susceptibility   Streptococcus constellatus - MIC*    PENICILLIN <=0.06 SENSITIVE Sensitive     CEFTRIAXONE 0.25 SENSITIVE Sensitive     ERYTHROMYCIN <=0.12 SENSITIVE Sensitive     LEVOFLOXACIN <=0.25 SENSITIVE Sensitive     VANCOMYCIN 0.25 SENSITIVE Sensitive     * STREPTOCOCCUS CONSTELLATUS  Blood Culture ID Panel (Reflexed)     Status: Abnormal   Collection Time: 08/24/2016 12:07 PM  Result Value Ref Range Status   Enterococcus species NOT DETECTED NOT DETECTED Final   Listeria  monocytogenes NOT DETECTED NOT DETECTED Final   Staphylococcus species NOT DETECTED NOT DETECTED Final   Staphylococcus aureus NOT DETECTED NOT DETECTED Final   Streptococcus species DETECTED (A) NOT DETECTED Final    Comment: CRITICAL RESULT CALLED TO, READ BACK BY AND VERIFIED WITH: ANDY  JOHNSTON,PHARMD @0013  08/28/16 MKELLY,MLT    Streptococcus agalactiae NOT DETECTED NOT DETECTED Final   Streptococcus pneumoniae NOT DETECTED NOT DETECTED Final   Streptococcus pyogenes NOT DETECTED NOT DETECTED Final   Acinetobacter baumannii NOT DETECTED NOT DETECTED Final   Enterobacteriaceae species NOT DETECTED NOT DETECTED Final   Enterobacter cloacae complex NOT DETECTED NOT DETECTED Final   Escherichia coli NOT DETECTED NOT DETECTED Final   Klebsiella oxytoca NOT DETECTED NOT DETECTED Final   Klebsiella pneumoniae NOT DETECTED NOT DETECTED Final   Proteus species NOT DETECTED NOT DETECTED Final   Serratia marcescens NOT DETECTED NOT DETECTED Final   Haemophilus influenzae NOT DETECTED NOT DETECTED Final   Neisseria meningitidis NOT DETECTED NOT DETECTED Final   Pseudomonas aeruginosa NOT DETECTED NOT DETECTED Final   Candida albicans NOT DETECTED NOT DETECTED Final   Candida glabrata NOT DETECTED NOT DETECTED Final   Candida krusei NOT DETECTED NOT DETECTED Final   Candida parapsilosis NOT DETECTED NOT DETECTED Final   Candida tropicalis NOT DETECTED NOT DETECTED Final  Culture, blood (x 2)     Status: Abnormal   Collection Time: 08/13/2016  6:10 PM  Result Value Ref Range Status   Specimen Description BLOOD RIGHT HAND  Final   Special Requests IN PEDIATRIC BOTTLE 2CC  Final   Culture  Setup Time   Final    GRAM POSITIVE COCCI IN PAIRS IN CHAINS IN PEDIATRIC BOTTLE CRITICAL RESULT CALLED TO, READ BACK BY AND VERIFIED WITH: VERONDA BRYK,PHARMD @0206  08/27/2016 MKELLY,MLT    Culture (A)  Final    STREPTOCOCCUS CONSTELLATUS SUSCEPTIBILITIES PERFORMED ON PREVIOUS CULTURE WITHIN THE LAST 5  DAYS.    Report Status 08/31/2016 FINAL  Final  Culture, blood (x 2)     Status: Abnormal   Collection Time: 08/17/2016  6:14 PM  Result Value Ref Range Status   Specimen Description BLOOD RIGHT THUMB  Final   Special Requests IN PEDIATRIC BOTTLE 1CC  Final   Culture  Setup Time   Final    GRAM POSITIVE COCCI IN PAIRS IN PEDIATRIC BOTTLE CRITICAL VALUE NOTED.  VALUE IS CONSISTENT WITH PREVIOUSLY REPORTED AND CALLED VALUE.    Culture (A)  Final    STREPTOCOCCUS CONSTELLATUS SUSCEPTIBILITIES PERFORMED ON PREVIOUS CULTURE WITHIN THE LAST 5 DAYS.    Report Status 08/31/2016 FINAL  Final  MRSA PCR Screening     Status: Abnormal   Collection Time: 08/27/16  3:50 AM  Result Value Ref Range Status   MRSA by PCR POSITIVE (A) NEGATIVE Final    Comment:        The GeneXpert MRSA Assay (FDA approved for NASAL specimens only), is one component of a comprehensive MRSA colonization surveillance program. It is not intended to diagnose MRSA infection nor to guide or monitor treatment for MRSA infections. RESULT CALLED TO, READ BACK BY AND VERIFIED WITH: EVERETTE,D RN 08/27/16 AT 0515 SKEEN,P   Aerobic/Anaerobic Culture (surgical/deep wound)     Status: None (Preliminary result)   Collection Time: 08/21/2016  4:01 PM  Result Value Ref Range Status   Specimen Description TISSUE BONE  Final   Special Requests SACRAL  Final   Gram Stain   Final    ABUNDANT WBC PRESENT, PREDOMINANTLY PMN ABUNDANT GRAM NEGATIVE RODS ABUNDANT GRAM POSITIVE COCCI IN PAIRS FEW GRAM POSITIVE RODS    Culture   Final    MODERATE KLEBSIELLA PNEUMONIAE Confirmed Extended Spectrum Beta-Lactamase Producer (ESBL) MODERATE ENTEROCOCCUS FAECALIS MODERATE STREPTOCOCCUS CONSTELLATUS HOLDING FOR POSSIBLE ANAEROBE    Report Status  PENDING  Incomplete   Organism ID, Bacteria KLEBSIELLA PNEUMONIAE  Final   Organism ID, Bacteria ENTEROCOCCUS FAECALIS  Final      Susceptibility   Enterococcus faecalis - MIC*    AMPICILLIN  <=2 SENSITIVE Sensitive     VANCOMYCIN 1 SENSITIVE Sensitive     GENTAMICIN SYNERGY SENSITIVE Sensitive     * MODERATE ENTEROCOCCUS FAECALIS   Klebsiella pneumoniae - MIC*    AMPICILLIN >=32 RESISTANT Resistant     CEFAZOLIN >=64 RESISTANT Resistant     CEFEPIME >=64 RESISTANT Resistant     CEFTAZIDIME >=64 RESISTANT Resistant     CEFTRIAXONE >=64 RESISTANT Resistant     CIPROFLOXACIN 2 INTERMEDIATE Intermediate     GENTAMICIN >=16 RESISTANT Resistant     IMIPENEM <=0.25 SENSITIVE Sensitive     TRIMETH/SULFA >=320 RESISTANT Resistant     AMPICILLIN/SULBACTAM >=32 RESISTANT Resistant     PIP/TAZO >=128 RESISTANT Resistant     Extended ESBL POSITIVE Resistant     * MODERATE KLEBSIELLA PNEUMONIAE  Culture, blood (Routine X 2) w Reflex to ID Panel     Status: None (Preliminary result)   Collection Time: 08/30/16  9:30 AM  Result Value Ref Range Status   Specimen Description BLOOD LEFT ANTECUBITAL  Final   Special Requests BOTTLES DRAWN AEROBIC ONLY 5CC  Final   Culture NO GROWTH 2 DAYS  Final   Report Status PENDING  Incomplete  Culture, blood (Routine X 2) w Reflex to ID Panel     Status: None (Preliminary result)   Collection Time: 08/30/16  9:35 AM  Result Value Ref Range Status   Specimen Description BLOOD BLOOD LEFT ARM  Final   Special Requests BOTTLES DRAWN AEROBIC ONLY 5CC  Final   Culture NO GROWTH 2 DAYS  Final   Report Status PENDING  Incomplete  Culture, respiratory (NON-Expectorated)     Status: None   Collection Time: 08/30/16  3:56 PM  Result Value Ref Range Status   Specimen Description TRACHEAL ASPIRATE  Final   Special Requests NONE  Final   Gram Stain   Final    ABUNDANT WBC PRESENT,BOTH PMN AND MONONUCLEAR MODERATE GRAM POSITIVE COCCI IN PAIRS FEW GRAM VARIABLE ROD FEW YEAST    Culture   Final    MODERATE KLEBSIELLA PNEUMONIAE MODERATE ACINETOBACTER CALCOACETICUS/BAUMANNII COMPLEX Confirmed Extended Spectrum Beta-Lactamase Producer (ESBL) for KLEBSIELLA  PNEUMONIAE    Report Status 09/02/2016 FINAL  Final   Organism ID, Bacteria KLEBSIELLA PNEUMONIAE  Final   Organism ID, Bacteria ACINETOBACTER CALCOACETICUS/BAUMANNII COMPLEX  Final      Susceptibility   Acinetobacter calcoaceticus/baumannii complex - MIC*    CEFTAZIDIME >=64 RESISTANT Resistant     CEFTRIAXONE >=64 RESISTANT Resistant     CIPROFLOXACIN >=4 RESISTANT Resistant     GENTAMICIN 4 SENSITIVE Sensitive     IMIPENEM 1 SENSITIVE Sensitive     PIP/TAZO >=128 RESISTANT Resistant     TRIMETH/SULFA >=320 RESISTANT Resistant     CEFEPIME >=64 RESISTANT Resistant     AMPICILLIN/SULBACTAM 4 SENSITIVE Sensitive     * MODERATE ACINETOBACTER CALCOACETICUS/BAUMANNII COMPLEX   Klebsiella pneumoniae - MIC*    AMPICILLIN >=32 RESISTANT Resistant     CEFAZOLIN >=64 RESISTANT Resistant     CEFEPIME >=64 RESISTANT Resistant     CEFTAZIDIME 16 RESISTANT Resistant     CEFTRIAXONE >=64 RESISTANT Resistant     CIPROFLOXACIN 1 SENSITIVE Sensitive     GENTAMICIN >=16 RESISTANT Resistant     IMIPENEM <=0.25 SENSITIVE Sensitive  TRIMETH/SULFA >=320 RESISTANT Resistant     AMPICILLIN/SULBACTAM >=32 RESISTANT Resistant     PIP/TAZO 8 SENSITIVE Sensitive     Extended ESBL POSITIVE Resistant     * MODERATE KLEBSIELLA PNEUMONIAE  Culture, respiratory (NON-Expectorated)     Status: None (Preliminary result)   Collection Time: 08/31/16  1:20 AM  Result Value Ref Range Status   Specimen Description TRACHEAL ASPIRATE  Final   Special Requests NONE  Final   Gram Stain   Final    ABUNDANT WBC PRESENT,BOTH PMN AND MONONUCLEAR NO ORGANISMS SEEN    Culture   Final    MODERATE ACINETOBACTER CALCOACETICUS/BAUMANNII COMPLEX   Report Status PENDING  Incomplete   *Note: Due to a large number of results and/or encounters for the requested time period, some results have not been displayed. A complete set of results can be found in Results Review.    Coagulation Studies: No results for input(s):  LABPROT, INR in the last 72 hours.  Imaging: Dg Chest Port 1 View  Result Date: 09/02/2016 CLINICAL DATA:  30 year old male central line placement. Initial encounter. EXAM: PORTABLE CHEST 1 VIEW COMPARISON:  08/31/2016 and earlier. FINDINGS: Portable AP semi upright view at at 0607 hours. The right subclavian approach PICC line has been removed. A right IJ approach central line remains in place, an the tip projects just below the level of the carina at the lower SVC level. No pneumothorax. Stable tracheostomy tube. Regressed but not completely resolved right upper lobe confluent airspace opacity. Mildly improved widespread left lung airspace opacity, significant upper lung residual. There is evidence of a small layering right pleural effusion. Stable cardiac size and mediastinal contours. Percutaneous gastrostomy tube visible with paucity of bowel gas in the upper abdomen. Stable visualized osseous structures. IMPRESSION: 1. Right IJ central line tip at the level of the lower SVC. No pneumothorax. Right PICC line removed. 2. Improved bilateral upper lobe predominant airspace disease compatible with improving pneumonia, but significant residual left greater than right. Small right pleural effusion. Electronically Signed   By: Genevie Ann M.D.   On: 09/02/2016 06:46   Dg Chest Port 1 View  Result Date: 08/31/2016 CLINICAL DATA:  Status post central line placement EXAM: PORTABLE CHEST 1 VIEW COMPARISON:  08/31/2016 FINDINGS: Cardiac shadow is stable. Diffuse bilateral infiltrates are again identified predominately in the upper lobes. Tracheostomy tube and right-sided PICC line are again seen and stable. A new right jugular central line is noted with the tip in the mid superior vena cava. No pneumothorax is noted. IMPRESSION: Status post right jugular central line placement with the tip in the mid superior vena cava. No pneumothorax is noted. The remainder of the exam is stable from the previous study.  Electronically Signed   By: Inez Catalina M.D.   On: 08/31/2016 13:13    Medications:  Scheduled: . anidulafungin  100 mg Intravenous Q24H  . budesonide (PULMICORT) nebulizer solution  0.5 mg Nebulization BID  . chlorhexidine gluconate (MEDLINE KIT)  15 mL Mouth Rinse BID  . imipenem-cilastatin  500 mg Intravenous Q8H   And  . dexamethasone  4 mg Intravenous Q8H  . insulin aspart  0-9 Units Subcutaneous Q4H  . ipratropium  0.5 mg Nebulization Q6H  . mouth rinse  15 mL Mouth Rinse QID  . midodrine  20 mg Per Tube TID WC  . oxybutynin  5 mg Per Tube BID  . pantoprazole sodium  40 mg Per Tube Daily  . potassium chloride (KCL MULTIRUN) 30  mEq in 265 mL IVPB  30 mEq Intravenous Once  . scopolamine  1 patch Transdermal Q72H    Assessment/Plan: 1. Encephalopathy likely in the setting of severe sepsis. Pt is better at following commands and may be back to his baseline   2. 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 Sepsis on  antibiotics.  Please feel free to call neurology with any questions or concerns         [email or web address]. M.D. [facility]   09/02/2016, 10:45 AM

## 2016-09-03 ENCOUNTER — Inpatient Hospital Stay (HOSPITAL_COMMUNITY): Payer: Medicaid Other

## 2016-09-03 ENCOUNTER — Encounter (HOSPITAL_COMMUNITY): Payer: Self-pay | Admitting: Interventional Radiology

## 2016-09-03 DIAGNOSIS — J690 Pneumonitis due to inhalation of food and vomit: Secondary | ICD-10-CM

## 2016-09-03 DIAGNOSIS — B954 Other streptococcus as the cause of diseases classified elsewhere: Secondary | ICD-10-CM

## 2016-09-03 DIAGNOSIS — R609 Edema, unspecified: Secondary | ICD-10-CM

## 2016-09-03 DIAGNOSIS — J9601 Acute respiratory failure with hypoxia: Secondary | ICD-10-CM

## 2016-09-03 HISTORY — PX: IR GENERIC HISTORICAL: IMG1180011

## 2016-09-03 LAB — GLUCOSE, CAPILLARY
GLUCOSE-CAPILLARY: 128 mg/dL — AB (ref 65–99)
GLUCOSE-CAPILLARY: 98 mg/dL (ref 65–99)
GLUCOSE-CAPILLARY: 140 mg/dL — AB (ref 65–99)
Glucose-Capillary: 135 mg/dL — ABNORMAL HIGH (ref 65–99)
Glucose-Capillary: 142 mg/dL — ABNORMAL HIGH (ref 65–99)
Glucose-Capillary: 82 mg/dL (ref 65–99)

## 2016-09-03 LAB — CBC
HEMATOCRIT: 26.9 % — AB (ref 39.0–52.0)
HEMATOCRIT: 23 % — AB (ref 39.0–52.0)
HEMOGLOBIN: 7.7 g/dL — AB (ref 13.0–17.0)
Hemoglobin: 9.3 g/dL — ABNORMAL LOW (ref 13.0–17.0)
MCH: 27.3 pg (ref 26.0–34.0)
MCH: 27.1 pg (ref 26.0–34.0)
MCHC: 34.6 g/dL (ref 30.0–36.0)
MCHC: 33.5 g/dL (ref 30.0–36.0)
MCV: 78.9 fL (ref 78.0–100.0)
MCV: 81 fL (ref 78.0–100.0)
PLATELETS: 51 10*3/uL — AB (ref 150–400)
Platelets: 39 10*3/uL — ABNORMAL LOW (ref 150–400)
RBC: 3.41 MIL/uL — ABNORMAL LOW (ref 4.22–5.81)
RBC: 2.84 MIL/uL — AB (ref 4.22–5.81)
RDW: 16.9 % — AB (ref 11.5–15.5)
RDW: 17 % — AB (ref 11.5–15.5)
WBC: 23.6 10*3/uL — ABNORMAL HIGH (ref 4.0–10.5)
WBC: 31.6 10*3/uL — AB (ref 4.0–10.5)

## 2016-09-03 LAB — AEROBIC/ANAEROBIC CULTURE W GRAM STAIN (SURGICAL/DEEP WOUND)

## 2016-09-03 LAB — BASIC METABOLIC PANEL
Anion gap: 11 (ref 5–15)
BUN: 21 mg/dL — ABNORMAL HIGH (ref 6–20)
CALCIUM: 8 mg/dL — AB (ref 8.9–10.3)
CO2: 24 mmol/L (ref 22–32)
CREATININE: 0.56 mg/dL — AB (ref 0.61–1.24)
Chloride: 106 mmol/L (ref 101–111)
GFR calc Af Amer: >60 mL/min (ref >60)
GLUCOSE: 100 mg/dL — AB (ref 65–99)
Potassium: 3.6 mmol/L (ref 3.5–5.1)
Sodium: 141 mmol/L (ref 135–145)

## 2016-09-03 LAB — MAGNESIUM: Magnesium: 1.7 mg/dL (ref 1.7–2.4)

## 2016-09-03 LAB — HEPARIN LEVEL (UNFRACTIONATED): Heparin Unfractionated: 0.16 IU/mL — ABNORMAL LOW (ref 0.30–0.70)

## 2016-09-03 LAB — AEROBIC/ANAEROBIC CULTURE (SURGICAL/DEEP WOUND)

## 2016-09-03 LAB — PHOSPHORUS: Phosphorus: 3.7 mg/dL (ref 2.5–4.6)

## 2016-09-03 MED ORDER — IOPAMIDOL (ISOVUE-300) INJECTION 61%
INTRAVENOUS | Status: AC
  Administered 2016-09-03: 15 mL
  Filled 2016-09-03: qty 50

## 2016-09-03 MED ORDER — PANTOPRAZOLE SODIUM 40 MG IV SOLR
40.0000 mg | INTRAVENOUS | Status: DC
  Administered 2016-09-03 – 2016-09-10 (×8): 40 mg via INTRAVENOUS
  Filled 2016-09-03 (×8): qty 40

## 2016-09-03 MED ORDER — LORAZEPAM 2 MG/ML IJ SOLN
0.5000 mg | Freq: Three times a day (TID) | INTRAMUSCULAR | Status: DC | PRN
Start: 2016-09-03 — End: 2016-09-29
  Administered 2016-09-03 – 2016-09-26 (×11): 0.5 mg via INTRAVENOUS
  Filled 2016-09-03 (×14): qty 1

## 2016-09-03 MED ORDER — LIDOCAINE VISCOUS 2 % MT SOLN
OROMUCOSAL | Status: AC
  Filled 2016-09-03: qty 15

## 2016-09-03 MED ORDER — INSULIN ASPART 100 UNIT/ML ~~LOC~~ SOLN
0.0000 [IU] | Freq: Three times a day (TID) | SUBCUTANEOUS | Status: DC
  Administered 2016-09-03: 1 [IU] via SUBCUTANEOUS
  Administered 2016-09-04 (×2): 2 [IU] via SUBCUTANEOUS
  Administered 2016-09-05 (×3): 5 [IU] via SUBCUTANEOUS
  Administered 2016-09-06: 7 [IU] via SUBCUTANEOUS
  Administered 2016-09-06: 9 [IU] via SUBCUTANEOUS
  Administered 2016-09-06: 7 [IU] via SUBCUTANEOUS
  Administered 2016-09-07: 9 [IU] via SUBCUTANEOUS

## 2016-09-03 MED ORDER — BACITRACIN-NEOMYCIN-POLYMYXIN 400-5-5000 EX OINT
1.0000 "application " | TOPICAL_OINTMENT | Freq: Every day | CUTANEOUS | Status: DC
  Filled 2016-09-03: qty 1

## 2016-09-03 MED ORDER — BACITRACIN-NEOMYCIN-POLYMYXIN OINTMENT TUBE
TOPICAL_OINTMENT | Freq: Every day | CUTANEOUS | Status: AC
  Administered 2016-09-03 – 2016-09-09 (×7): via TOPICAL
  Filled 2016-09-03: qty 15

## 2016-09-03 MED ORDER — HEPARIN (PORCINE) IN NACL 100-0.45 UNIT/ML-% IJ SOLN
900.0000 [IU]/h | INTRAMUSCULAR | Status: DC
  Administered 2016-09-03: 900 [IU]/h via INTRAVENOUS
  Filled 2016-09-03: qty 250

## 2016-09-03 NOTE — Progress Notes (Signed)
Critical report for positive DVT in RIGHT subclavian and RIGHT brachial vein given to Dr Orland Dec. Pts father at bedside and updated.

## 2016-09-03 NOTE — Progress Notes (Signed)
Patient transferred to 4E27 from 52M. Patient tolerated transfer. No evidence of discomfort noted at this time.

## 2016-09-03 NOTE — Progress Notes (Signed)
  Patient ID: Jason [name], male   DOB: January 08, 1986, 30 y.o.   MRN: [id number]  Planning and attempting for gastric tube exchange since last week. Pt has been deemed unstable to travel to IR all last week.  Please let [name] know when you feel pt is able to come to IR for exchange.  Call [phone]  Thank you.

## 2016-09-03 NOTE — Procedures (Signed)
Interventional Radiology Procedure Note  Procedure: Replacement of pull-through 16F g-tube with a new 16F balloon retention g-tube.  Old tube removed in its entirety.  Complications: None Recommendations:  - Ok to use   - Routine care   Signed,  Dulcy Fanny. Earleen Newport, DO

## 2016-09-03 NOTE — Progress Notes (Signed)
  RT transported pt on vent from 2M10 to 4E27 on ventilator. Pt stable throughout with no complications. RT will continue to monitor.

## 2016-09-03 NOTE — Progress Notes (Addendum)
ANTICOAGULATION CONSULT NOTE - Initial Consult  Pharmacy Consult for heparin  Indication: DVT  Allergies  Allergen Reactions  . Amikacin Sulfate Other (See Comments)    Seizure   . Cefuroxime Axetil Anaphylaxis    Tolerated cefepime 12/2015 - with Pepcid/Solu-Medrol  . Ertapenem Other (See Comments)    Rash and confusion-->tolerated Imipenem   . Morphine And Related Other (See Comments)    Changed mental status, confusion, headache, visual hallucination  . Nsaids Itching and Other (See Comments)    Risk of bleeding, itching  . Penicillins Anaphylaxis and Other (See Comments)    Tolerated Imipenem; no reaction to 7 day course of amoxicillin in 2015 Has patient had a PCN reaction causing immediate rash, facial/tongue/throat swelling, SOB or lightheadedness with hypotension: Yes Has patient had a PCN reaction causing severe rash involving mucus membranes or skin necrosis: No Has patient had a PCN reaction that required hospitalization Yes Has patient had a PCN reaction occurring within the last 10 years: No If all of the above answers are "NO", then may proceed with Cephalo  . Sulfa Antibiotics Anaphylaxis, Shortness Of Breath and Other (See Comments)  . Tessalon [Benzonatate] Anaphylaxis  . Aztreonam Swelling    He appears to tolerate if you premedicate with steroids. Had facial/neck swelling without premedication  . Levaquin [Levofloxacin In D5w] Swelling    Hand and forearm swelling  . Rocephin [Ceftriaxone] Other (See Comments)    Confusion  . Shellfish Allergy Itching and Other (See Comments)    Took benadryl to alleviate reaction  . Miripirium Rash and Other (See Comments)    Change in mental status    Patient Measurements: Height: 5\' 8"  (172.7 cm) Weight: 124 lb (56.2 kg) IBW/kg (Calculated) : 68.4 Heparin Dosing Weight: 56.2 kg   Vital Signs: Temp: 100 F (37.8 C) (11/27 1216) Temp Source: Oral (11/27 1216) BP: 116/67 (11/27 1114) Pulse Rate: 90 (11/27  1300)  Labs:  Recent Labs  09/01/16 0400 09/02/16 0523 09/02/16 1214 09/03/16 0527  HGB 9.2* 9.5*  --  9.3*  HCT 27.3* 28.0*  --  26.9*  PLT 93* 57*  --  51*  CREATININE 0.81 0.56* 0.60* 0.56*    Estimated Creatinine Clearance: 105.4 mL/min (by C-G formula based on SCr of 0.56 mg/dL (L)).  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 Patient also has chronic sacral and pelvic osteomyelitis. Incidental finding of non-occlusive DVT found in right upper extremity in both subclavian and brachial veins. Hgb low, but stable. Of note, patient has low platelets of 51 (503 on admission). Discussed concern for bleeding with Dr. Corrie Dandy. Plan to continue with heparin gtt due to extensiveness of DVT. Patient has not received any heparin during this admission. Due to thrombocytopenia, will not bolus heparin and will aim for conservative goal. No s/s bleeding noted.   Goal of Therapy:  Heparin level 0.3-0.5 units/mL Monitor platelets by anticoagulation protocol: Yes   Plan:  Start heparin gtt at 900 units/hr  Heparin level in 6 hours CBC at 1700  Daily heparin level and CBC Monitor closely for s/s bleeding  Watch platelets   Argie Ramming, PharmD Pharmacy Resident  Pager [phone] 09/03/16 1:16 PM

## 2016-09-03 NOTE — Progress Notes (Signed)
*  PRELIMINARY RESULTS* Vascular Ultrasound Right upper extremity venous duplex has been completed.  Preliminary findings: Non-occlusive DVT noted in the right subclavian vein and right brachial veins.  Report given to [facility], RN   Landry Mellow, RDMS, RVT  09/03/2016, 12:49 PM

## 2016-09-03 NOTE — Progress Notes (Signed)
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 Started on Azactam without premedication and he has become lethargic and has mild swelling around trach without compromise. Solumedrol has been started and he is HD stable. Mother reports this is his usual reaction to Azactam. PCCM saw 11/21 and signed off.  Called back 11/23 for worsening lethargy and request for vent support.    SIGNIFICANT EVENTS  11/22>> surgical bx sacral bone/wound  STUDIES:  11/21 ct head >>neg   ABx:  linezolid 11/19>>> 11/24 tigecycline 11/21>>> 11/24 Vanc >>11/25 >> 11/26 Eraxis >>11/24 >> 11/26 Imipenem >>11/24   MICRO:  BC x 2 11/23>>> Sacral bone 11/22>>> ESBL Kleb pna, Enterococcus faecalis, strep constellatus  SUBJECTIVE:   More "awake and trying to converse (baseiline) On and off agitation.  weaning   VITAL SIGNS: Temp:  [98.2 F (36.8 C)-100.1 F (37.8 C)] 100 F (37.8 C) (11/27 1216) Pulse Rate:  [67-100] 87 (11/27 1337) Resp:  [11-34] 25 (11/27 1337) BP: (116-144)/(61-98) 116/61 (11/27 1337) SpO2:  [91 %-100 %] 99 % (11/27 1337) Arterial Line BP: (114-160)/(65-91) 115/65 (11/27 1300) FiO2 (%):  [40 %] 40 % (11/27 1337) Weight:  [56.2 kg (124 lb)] 56.2 kg (124 lb) (11/27 0500)  PHYSICAL EXAMINATION: General:  Chronically ill appearing male, NAD on vent  Neuro:  Awake, follows commands HEENT:  Mm moist, no JVD, chronic trach #6 cuffed c/d  Cardiovascular:  s1s2 rrr Lungs:  resps even non labored on vent, scattered rhonchi  Abdomen:  Round, soft, dec BS. anasarcous Musculoskeletal:  Warm and dry, large sacral decub, dressed. Anasarcous > less last 24 hrs.    Recent Labs Lab 09/02/16 0523  09/02/16 1214 09/03/16 0527  NA 140 133* 141  K 3.2* 3.6 3.6  CL 107 101 106  CO2 25 24 24   BUN 21* 22* 21*  CREATININE 0.56* 0.60* 0.56*  GLUCOSE 104* 167* 100*    Recent Labs Lab 09/01/16 0400 09/02/16 0523 09/03/16 0527  HGB 9.2* 9.5* 9.3*  HCT 27.3* 28.0* 26.9*  WBC 47.2* 30.8* 23.6*  PLT 93* 57* 51*   Dg Chest Port 1 View  Result Date: 09/02/2016 CLINICAL DATA:  30 year old male central line placement. Initial encounter. EXAM: PORTABLE CHEST 1 VIEW COMPARISON:  08/31/2016 and earlier. FINDINGS: Portable AP semi upright view at at 0607 hours. The right subclavian approach PICC line has been removed. A right IJ approach central line remains in place, an the tip projects just below the level of the carina at the lower SVC level. No pneumothorax. Stable tracheostomy tube. Regressed but not completely resolved right upper lobe confluent airspace opacity. Mildly improved widespread left lung airspace opacity, significant upper lung residual. There is evidence of a small layering right pleural effusion. Stable cardiac size and mediastinal contours. Percutaneous gastrostomy tube visible with paucity of bowel gas in the upper abdomen. Stable visualized osseous structures. IMPRESSION: 1. Right IJ central line tip at the level of the lower SVC. No pneumothorax. Right PICC line removed. 2. Improved bilateral upper lobe predominant airspace disease compatible with improving pneumonia, but significant residual left greater than right. Small right pleural effusion. Electronically Signed   By: Genevie Ann M.D.   On: 09/02/2016 06:46  ASSESSMENT / PLAN:  Acute on chronic trach dependent respiratory failure Acute Hypoxemic resp failure 2/2 shock/severe sepsis/ concerned for HCAP and Pulm edema. Improved, Chronic trach  Previous chronic vent - has not required qhs vent in ~8 months per mom  PLAN -  Vent support - 8cc/kg. Cutting down PEEP from 8 >> 5. Anticipate can be off vent next 24 hrs.  Cont  abx per ID.    Pulmonary hygiene as able  BD's   S/P Severe sepsis/septic shock.  - multifactorial in setting multiple wounds, osteomyelitis +/- septic arthritis +/- HCAP.  Pt with K pneumo and E faecalis in bone culture Pt with strep viridans bacteremia Adrenal insufficiency Chronic hypotension  PLAN -  abx as mentioned -  Imipenem per ID. Clinically improved with abx and fluids. 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 Multifactorial but mostly 2/2 severe sepsis/septic shock since 11/22, resolving.  PLAN - Hold off MRI as pt's mental status seems baseline now.  Fentanyl and ativan prn.   Lower GI bleeding  Blood loss anemia  Diarrhea - improved  PLAN -  GI following  PPI  Pt with clogged PEG > spoke to IR today. They will work on de clogging the PEG today.    AKI > resolved.  Metabolic acidosis > resolved Lactic acidosis > improved PLAN -  Improved. Will d/c IVF once on TF  Acute DVT in R Subclavian Vein and R brachial vein (09/03/16). Likely related to PICC which was removed 11/22.  PLAN - PICC has been removed. Start heparin drip, no bolus. WOF drop in plt (has not received heparin this admission but plt dropping likely related to sepsis/meds). WOF anemia. Check CBC this pm > if dropping, will d/c heparin drip.  May need a repeat Dopplers of RUE on 12/4 > if DVT is resolved as PICC has been removed, no need for heparin.    Critical care time with this patient : 30 minutes. Mother updated on 11/25.   Father updated on 11/27.  Transfer to 4E/SDU. TRH will be primary starting 11/28, PCCM will follow peripherally.    Monica Becton,  MD 09/03/2016, 2:03 PM [facility] Pager [phone] After 3 pm or if no answer, call [phone]

## 2016-09-03 NOTE — Progress Notes (Signed)
Pharmacy called and made aware of drop in hemoglobin from 9.3 to 7.7. Heparin drip was started this afternoon around 1400. No obvious signs of bleeding. Will continue to monitor.

## 2016-09-03 NOTE — Progress Notes (Signed)
Pt trach last changed on 10/20. Will need order from MD for trach change. RT will continue to monitor.

## 2016-09-03 NOTE — Progress Notes (Signed)
[name] for Infectious Disease    Date of Admission:  08/16/2016   Total days of antibiotics 9        Day 4 imipenem        (d/c'd tige, linezolid, anidula)   ID: Jason [name] is a 30 y.o. 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 Micro cx have found strep constellatus bacteremia, polymicrobial + infection of sacral osteo with ESBL kleb, amp S enterococcus, and strep constellatus.  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 ([name])   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([place])   History of pulmonary embolism   DVT (deep venous thrombosis) ([name])   Severe protein-calorie malnutrition ([name])   Dysphagia, pharyngoesophageal phase   Decubitus ulcer of right perineal ischial region, stage 4 ([name])   Decubitus ulcer of left perineal ischial region, stage 4 ([name])   Sacral decubitus ulcer, stage IV ([name])   Multiple allergies   Recurrent UTI   Respiratory failure, chronic ([name])   Aspiration pneumonia of right lower lobe ([name])   Seizures ([name])   Anxiety state   Anemia of chronic disease   Hyponatremia   Encephalopathy   Other acute osteomyelitis, multiple sites ([place])   Lower GI bleed   Viridans streptococci infection   Coma of unknown cause ([name])   Septic shock ([name])   Anaphylactic syndrome   Acute hypoxemic respiratory failure ([name])   Enterococcal infection    Subjective: Temp of 100.1, remains on vent but weaning, more alert though not communicative, going to get peg tube exchanged  Leukocytosis improved to 23.6, he continues to receive decadron prior to iv abtx administration  Medications:  . baclofen  10 mg Oral QID  . budesonide  (PULMICORT) nebulizer solution  0.5 mg Nebulization BID  . chlorhexidine gluconate (MEDLINE KIT)  15 mL Mouth Rinse BID  . imipenem-cilastatin  500 mg Intravenous Q8H   And  . dexamethasone  4 mg Intravenous Q8H  . insulin aspart  0-9 Units Subcutaneous Q4H  . ipratropium  0.5 mg Nebulization Q6H  . mouth rinse  15 mL Mouth Rinse QID  . midodrine  20 mg Per Tube TID WC  . oxybutynin  5 mg Per Tube BID  . pantoprazole (PROTONIX) IV  40 mg Intravenous Q24H  . scopolamine  1 patch Transdermal Q72H    Objective: Vital signs in last 24 hours: Temp:  [98 F (36.7 C)-100.1 F (37.8 C)] 100.1 F (37.8 C) (11/27 0803) Pulse Rate:  [67-89] 89 (11/27 1030) Resp:  [11-30] 26 (11/27 1030) BP: (141-144)/(79-98) 141/79 (11/27 0815) SpO2:  [91 %-100 %] 100 % (11/27 1030) Arterial Line BP: (128-160)/(73-91) 136/77 (11/27 1030) FiO2 (%):  [40 %] 40 % (11/27 0815) Weight:  [124 lb (56.2 kg)] 124 lb (56.2 kg) (11/27 0500) Physical Exam  Constitutional: He is oriented to self.appears chronically ill. . No distress.  HENT:  Mouth/Throat: Oropharynx is clear and moist. On trahc  Cardiovascular: Normal rate, regular rhythm and normal heart sounds. Exam reveals no gallop and no friction rub.  No murmur heard.  Pulmonary/Chest: Effort normal and breath sounds normal. No respiratory distress. He has no wheezes.  Ext: anasarca diffusely Neurological: alert,  Left had tremor Skin: Skin is warm and dry. No rash noted. No erythema.  Psychiatric: He has a normal mood and affect. His behavior is normal.     Lab Results  Recent Labs  09/02/16 0523 09/02/16 1214 09/03/16 0527  WBC 30.8*  --  23.6*  HGB 9.5*  --  9.3*  HCT 28.0*  --  26.9*  NA 140 133* 141  K 3.2* 3.6 3.6  CL 107 101 106  CO2 25 24 24   BUN 21* 22* 21*  CREATININE 0.56* 0.60* 0.56*    Microbiology: 11/23 resp cx acinetobacter, and ESLB kleb pneumo 11/23 blood cx ngtd 11/22 tissue cx: ESBL kleb pneumo, amp S enterococcus,  strep constellatus 11/19 blood cx x 3 sets strep constellatus 11/19 urine cx acinetobacter, e.faecalis Studies/Results: Dg Chest Port 1 View  Result Date: 09/02/2016 CLINICAL DATA:  31 year old male central line placement. Initial encounter. EXAM: PORTABLE CHEST 1 VIEW COMPARISON:  08/31/2016 and earlier. FINDINGS: Portable AP semi upright view at at 0607 hours. The right subclavian approach PICC line has been removed. A right IJ approach central line remains in place, an the tip projects just below the level of the carina at the lower SVC level. No pneumothorax. Stable tracheostomy tube. Regressed but not completely resolved right upper lobe confluent airspace opacity. Mildly improved widespread left lung airspace opacity, significant upper lung residual. There is evidence of a small layering right pleural effusion. Stable cardiac size and mediastinal contours. Percutaneous gastrostomy tube visible with paucity of bowel gas in the upper abdomen. Stable visualized osseous structures. IMPRESSION: 1. Right IJ central line tip at the level of the lower SVC. No pneumothorax. Right PICC line removed. 2. Improved bilateral upper lobe predominant airspace disease compatible with improving pneumonia, but significant residual left greater than right. Small right pleural effusion. Electronically Signed   By: Genevie Ann M.D.   On: 09/02/2016 06:46     Assessment/Plan: Streptococcus constellatus bacteremia = would be covered by imipenem  Sacral osteo = quite complex given burden of disease per discussion with dr Marla Roe. Not likely a surgical candidate for debridement due to extensive disease, frailty of bone. Will continue to treat with imipenem, though at risk for further fracturing.   ?vertebral osteo = lesions noted to lumbar spine suggestive of compression fracture but also other lesions. Once patient is stabilized can consider doing mri. IV abtx to be estimated for 6 wk  PICC line access= unclear if his  current line was changed out from admit due to bacteremia. If his picc line has been in place since admit, recommend to change out.  + ur cx = he has known colonization.  Thrombocytopenia = likely due to the  linezolid exposure vs. Compounded due to sepsis. Will continue to monitor cbc and platelets. Concern is that imipenem is also associated with thrombocytopenia. Would like to follow trend before automatically changing to another abtx. Choices are limited given his abtx allergies  Pneumonia vs. Pulmonary edema = he has improvement in cxr after also receiving iv lasix. He currently is receiving imipenem which would cover his pathogen, though this is could colonization gib = resolved  Baxter Flattery [facility] for Infectious Diseases Cell: [phone] Pager: [phone]  09/03/2016, 10:43 AM

## 2016-09-03 NOTE — Progress Notes (Addendum)
Heparin gtt  stopped as per Pharmacy. Noted some oozing  bright red blood around PEG tube placement.

## 2016-09-03 NOTE — Progress Notes (Signed)
OT Cancellation Note  Patient Details Name: Jason [name] MRN: [id number] DOB: [birth year]   Cancelled Treatment:    Reason Eval/Treat Not Completed: Medical issues which prohibited therapy.  Hero Mccathern [place], OTR/L [id number]   Lucille Passy M 09/03/2016, 1:20 PM

## 2016-09-03 NOTE — Progress Notes (Signed)
Report called to [facility] RN. Pt transferred via bed with RT. Pt tolerated transfer well. Pt transferred with personal belongings. Pts mother/father called and made aware of transfer.

## 2016-09-03 NOTE — Progress Notes (Signed)
Pt transported on vent by RT to IR and back to [facility]M10. Pt's vitals remained stable throughout.

## 2016-09-03 NOTE — Progress Notes (Signed)
ANTICOAGULATION CONSULT NOTE - Follow-up Consult  Pharmacy Consult for heparin  Indication: DVT  Allergies  Allergen Reactions  . Amikacin Sulfate Other (See Comments)    Seizure   . Cefuroxime Axetil Anaphylaxis    Tolerated cefepime 12/2015 - with Pepcid/Solu-Medrol  . Ertapenem Other (See Comments)    Rash and confusion-->tolerated Imipenem   . Morphine And Related Other (See Comments)    Changed mental status, confusion, headache, visual hallucination  . Nsaids Itching and Other (See Comments)    Risk of bleeding, itching  . Penicillins Anaphylaxis and Other (See Comments)    Tolerated Imipenem; no reaction to 7 day course of amoxicillin in 2015 Has patient had a PCN reaction causing immediate rash, facial/tongue/throat swelling, SOB or lightheadedness with hypotension: Yes Has patient had a PCN reaction causing severe rash involving mucus membranes or skin necrosis: No Has patient had a PCN reaction that required hospitalization Yes Has patient had a PCN reaction occurring within the last 10 years: No If all of the above answers are "NO", then may proceed with Cephalo  . Sulfa Antibiotics Anaphylaxis, Shortness Of Breath and Other (See Comments)  . Tessalon [Benzonatate] Anaphylaxis  . Aztreonam Swelling    He appears to tolerate if you premedicate with steroids. Had facial/neck swelling without premedication  . Levaquin [Levofloxacin In D5w] Swelling    Hand and forearm swelling  . Rocephin [Ceftriaxone] Other (See Comments)    Confusion  . Shellfish Allergy Itching and Other (See Comments)    Took benadryl to alleviate reaction  . Miripirium Rash and Other (See Comments)    Change in mental status   Patient Measurements: Height: 5\' 8"  (172.7 cm) Weight: 124 lb (56.2 kg) IBW/kg (Calculated) : 68.4 Heparin Dosing Weight: 56.2 kg   Vital Signs: Temp: 99 F (37.2 C) (11/27 1821) Temp Source: Oral (11/27 1821) BP: 75/64 (11/27 2014) Pulse Rate: 88 (11/27  2014)  Labs:  Recent Labs  09/02/16 0523 09/02/16 1214 09/03/16 0527 09/03/16 1710 09/03/16 2002  HGB 9.5*  --  9.3* 7.7*  --   HCT 28.0*  --  26.9* 23.0*  --   PLT 57*  --  51* 39*  --   HEPARINUNFRC  --   --   --   --  0.16*  CREATININE 0.56* 0.60* 0.56*  --   --    Estimated Creatinine Clearance: 105.4 mL/min (by C-G formula based on SCr of 0.56 mg/dL (L)).  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 Patient also has chronic sacral and pelvic osteomyelitis. Incidental finding of non-occlusive DVT found in right upper extremity in both subclavian and brachial veins. Hgb low at 9.3, and has decreased following PEG exchange to 7.7. Of note platelets on admission 503, now down to 39. Heparin infusion initially continued due to extensiveness of DVT, but after discussion with Dr. Halford Chessman, heparin will be held. Patient has not received any heparin during this admission prior to starting infusion earlier today.  RN reported bright red blood oozing around PEG tube site and notable swelling of arm. Repeat CBC has been ordered for 2300.  Goal of Therapy:   Heparin level 0.3-0.5 units/mL  Monitor platelets by anticoagulation protocol: Yes   Plan:   Hold heparin   CBC at 2300 per Dr. Halford Chessman  Daily heparin level and CBC  Monitor closely for s/s bleeding   Watch platelets   Georga Bora, PharmD Clinical Pharmacist Pager: [phone] 09/03/2016 8:57 PM

## 2016-09-04 ENCOUNTER — Inpatient Hospital Stay (HOSPITAL_COMMUNITY): Payer: Medicaid Other

## 2016-09-04 DIAGNOSIS — Z930 Tracheostomy status: Secondary | ICD-10-CM

## 2016-09-04 LAB — GLUCOSE, CAPILLARY
GLUCOSE-CAPILLARY: 152 mg/dL — AB (ref 65–99)
GLUCOSE-CAPILLARY: 160 mg/dL — AB (ref 65–99)
GLUCOSE-CAPILLARY: 209 mg/dL — AB (ref 65–99)
GLUCOSE-CAPILLARY: 230 mg/dL — AB (ref 65–99)
Glucose-Capillary: 137 mg/dL — ABNORMAL HIGH (ref 65–99)
Glucose-Capillary: 176 mg/dL — ABNORMAL HIGH (ref 65–99)
Glucose-Capillary: 182 mg/dL — ABNORMAL HIGH (ref 65–99)

## 2016-09-04 LAB — CULTURE, BLOOD (ROUTINE X 2)
Culture: NO GROWTH
Culture: NO GROWTH

## 2016-09-04 LAB — CBC
HEMATOCRIT: 23.4 % — AB (ref 39.0–52.0)
HEMATOCRIT: 23.4 % — AB (ref 39.0–52.0)
HEMOGLOBIN: 7.7 g/dL — AB (ref 13.0–17.0)
Hemoglobin: 7.8 g/dL — ABNORMAL LOW (ref 13.0–17.0)
MCH: 27.1 pg (ref 26.0–34.0)
MCH: 27 pg (ref 26.0–34.0)
MCHC: 33.3 g/dL (ref 30.0–36.0)
MCHC: 32.9 g/dL (ref 30.0–36.0)
MCV: 81.3 fL (ref 78.0–100.0)
MCV: 82.1 fL (ref 78.0–100.0)
PLATELETS: 37 10*3/uL — AB (ref 150–400)
Platelets: 38 10*3/uL — ABNORMAL LOW (ref 150–400)
RBC: 2.88 MIL/uL — ABNORMAL LOW (ref 4.22–5.81)
RBC: 2.85 MIL/uL — ABNORMAL LOW (ref 4.22–5.81)
RDW: 17.1 % — AB (ref 11.5–15.5)
RDW: 17 % — AB (ref 11.5–15.5)
WBC: 26.4 10*3/uL — AB (ref 4.0–10.5)
WBC: 29.3 10*3/uL — ABNORMAL HIGH (ref 4.0–10.5)

## 2016-09-04 LAB — CULTURE, RESPIRATORY W GRAM STAIN

## 2016-09-04 LAB — MAGNESIUM: Magnesium: 1.6 mg/dL — ABNORMAL LOW (ref 1.7–2.4)

## 2016-09-04 LAB — BASIC METABOLIC PANEL
Anion gap: 20 — ABNORMAL HIGH (ref 5–15)
BUN: 22 mg/dL — AB (ref 6–20)
CALCIUM: 7.9 mg/dL — AB (ref 8.9–10.3)
CHLORIDE: 109 mmol/L (ref 101–111)
CO2: 15 mmol/L — AB (ref 22–32)
CREATININE: 1.11 mg/dL (ref 0.61–1.24)
GFR calc non Af Amer: >60 mL/min (ref >60)
Glucose, Bld: 164 mg/dL — ABNORMAL HIGH (ref 65–99)
Potassium: 3.8 mmol/L (ref 3.5–5.1)
Sodium: 144 mmol/L (ref 135–145)

## 2016-09-04 LAB — CULTURE, RESPIRATORY

## 2016-09-04 LAB — PHOSPHORUS: Phosphorus: 4.6 mg/dL (ref 2.5–4.6)

## 2016-09-04 NOTE — Progress Notes (Signed)
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 Started on Azactam without premedication and he has become lethargic and has mild swelling around trach without compromise. Solumedrol has been started and he is HD stable. Mother reports this is his usual reaction to Azactam. PCCM saw 11/21 and signed off.  Called back 11/23 for worsening lethargy and request for vent support.    SIGNIFICANT EVENTS  11/22>> surgical bx sacral bone/wound  STUDIES:  11/21 ct head >>neg   ABx:  Per ID linezolid 11/19>>> 11/24 tigecycline 11/21>>> 11/24 Vanc >>11/25 >> 11/26 Eraxis >>11/24 >> 11/26 Imipenem >>11/24   MICRO:  BC x 2 11/23>>> Sacral bone 11/22>>> ESBL Kleb pna, Enterococcus faecalis, strep constellatus  SUBJECTIVE:   Lethargic   VITAL SIGNS: Temp:  [98.1 F (36.7 C)-100.1 F (37.8 C)] 99 F (37.2 C) (11/28 0741) Pulse Rate:  [81-100] 91 (11/28 0600) Resp:  [20-37] 24 (11/28 0600) BP: (75-141)/(51-90) 114/85 (11/28 0741) SpO2:  [90 %-100 %] 100 % (11/28 0600) Arterial Line BP: (94-142)/(51-79) 101/55 (11/27 1745) FiO2 (%):  [40 %] 40 % (11/28 0400) Weight:  [126 lb (57.2 kg)] 126 lb (57.2 kg) (11/28 0500)  PHYSICAL EXAMINATION: General:  Chronically ill appearing male, NAD on vent  Neuro:  Awake, lethargic HEENT:  Mm moist, no JVD, chronic trach #6 cuffed c/d , rt I j cvl in place Cardiovascular:  s1s2 rrr Lungs:  resps even non labored on vent, scattered rhonchi on full vent support Abdomen:  Round, soft, dec BS. anasarcous Musculoskeletal:  Warm and dry, large sacral decub, dressed. Anasarcous > less last 24 hrs.    Recent Labs Lab 09/02/16 1214 09/03/16 0527 09/04/16 0500    NA 133* 141 144  K 3.6 3.6 3.8  CL 101 106 109  CO2 24 24 15*  BUN 22* 21* 22*  CREATININE 0.60* 0.56* 1.11  GLUCOSE 167* 100* 164*    Recent Labs Lab 09/03/16 1710 09/03/16 2300 09/04/16 0500  HGB 7.7* 7.8* 7.7*  HCT 23.0* 23.4* 23.4*  WBC 31.6* 26.4* 29.3*  PLT 39* 37* 38*   Ir Replc Gastro/colonic Tube Percut W/fluoro  Result Date: 09/03/2016 INDICATION: 30 year old male with malfunctioning gastrostomy tube. He has been referred for evaluation for replacement. EXAM: GASTROSTOMY CATHETER REPLACEMENT MEDICATIONS: None ANESTHESIA/SEDATION: None CONTRAST:  10 cc - administered into the gastric lumen. FLUOROSCOPY TIME:  Fluoroscopy Time: 0 minutes 18 seconds (0.5 mGy). COMPLICATIONS: None PROCEDURE: Informed written consent was obtained from the patient's family after a thorough discussion of the procedural risks, benefits and alternatives. All questions were addressed. Maximal Sterile Barrier Technique was utilized including caps, mask, sterile gowns, sterile gloves, sterile drape, hand hygiene and skin antiseptic. A timeout was performed prior to the initiation of the procedure. Patient is placed supine position on the fluoroscopy table. 035 wire was placed through the indwelling gastrostomy tube. Gastrostomy tube was removed with manual traction. Tube was removed in its entirety. Over the wire, a new 77 French balloon retention was placed. Wire was removed and the balloon is for latent with 8 cc of saline. Injection of contrast confirmed location within the stomach lumen. Patient tolerated the procedure well and remained hemodynamically stable throughout. No complications were encountered  and no significant blood loss. IMPRESSION: Status post gastrostomy tube exchange, with placement of a new 33 French balloon retention tube. Signed, Dulcy Fanny. Earleen Newport, DO [facility] Radiology Electronically Signed   By: Corrie Mckusick D.O.   On: 09/03/2016 18:06    Dg Chest Port 1 View  Result Date: 09/04/2016 CLINICAL DATA:  Acute respiratory failure. History of asthma, previous CVA, former smoker. EXAM: PORTABLE CHEST 1 VIEW COMPARISON:  Portable chest x-ray of September 02, 2016 FINDINGS: The lungs are well-expanded. There are confluent interstitial and alveolar opacities in the left lung and to a lesser extent in the right upper lobe. There is tenting of the right hemidiaphragm. There is a small pleural effusion on the right. The cardiac silhouette is normal in size. The pulmonary vascularity is indistinct. The tracheostomy appliance tip projects between the clavicular heads. The right internal jugular venous catheter tip projects over the midportion of the SVC. The observed bony thorax exhibits no acute abnormality. IMPRESSION: Fairly stable bilateral interstitial and alveolar opacities consistent with pneumonia. Small right pleural effusion, stable. No overt CHF. Electronically Signed   By: David  Martinique M.D.   On: 09/04/2016 07:36    ASSESSMENT / PLAN:  Acute on chronic trach dependent respiratory failure Acute Hypoxemic resp failure 2/2 shock/severe sepsis/ concerned for HCAP and Pulm edema. Improved, Chronic trach  Previous chronic vent - has not required qhs vent in ~8 months per mom  PLAN -  Vent support - 8cc/kg. Cutting down PEEP from 8 >> 5. Try for T-collar but he remains weak Cont abx per ID.    Pulmonary hygiene as able  BD's   S/P Severe sepsis/septic shock.  - multifactorial in setting multiple wounds, osteomyelitis +/- septic arthritis +/- HCAP.  Pt with K pneumo and E faecalis in bone culture Pt with strep viridans bacteremia Adrenal insufficiency Chronic hypotension  PLAN -  abx as mentioned -  Imipenem per ID. Clinically improved with abx and fluids. 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 Multifactorial but mostly 2/2 severe sepsis/septic shock since 11/22, resolving.  PLAN - Hold off MRI as pt's mental status more responsive but not at baseline Fentanyl and ativan prn.   Lower GI bleeding  Blood loss anemia   Recent Labs  09/03/16 2300 09/04/16 0500  HGB 7.8* 7.7*    Diarrhea - improved  PLAN -  GI following  PPI  Pt with clogged PEG > spoke to IR today. They will work on de clogging the PEG today.    AKI > resolved.  Metabolic acidosis > resolved Lactic acidosis > improved PLAN -  Improved. Will d/c IVF once on TF  Acute DVT in R Subclavian Vein and R brachial vein (09/03/16). Likely related to PICC which was removed 11/22.  PLAN - PICC has been removed. Rt ij cvl placed Start heparin drip, no bolus. Stopped 11/27 for low hgb/thrombocytopenia May need a repeat Dopplers of RUE on 12/4 > if DVT is resolved as PICC has been removed, no need for heparin.    Critical care time with this patient : 30 minutes. Mother updated on 11/25.   Father updated on 11/28.  Transfer to  4E/SDU. TRH will be primary starting 11/28, PCCM will follow peripherally.    [name] Minor ACNP [name] PCCM Pager [phone] till 3 pm If no answer page [phone] 09/04/2016, 7:42 AM  Attending Note:  30 year old male s/p traumatic spinal cord injury that is a quad and is vent dependent via trach who presents with respiratory failure acute on chronic due to HCAP with associated sepsis and shock.  On exam, contracted with coarse BS diffusely.  I reviewed CXR myself, trach in good position and infiltrate noted.  Discussed with PCCM-NP.  Acute on chronic respiratory failure:  - Vent settings designated.  - No need for further ABGs at this point  Trach status:  - Maintain current trach  type and size.  HCAP:  - Abx as ordered.  - F/u on cultures.  Hypoxemia:  - Titrate O2 for sat of 88-92%.  PCCM will see on T/Th.  Patient seen and examined, agree with above note.  I dictated the care and orders written for this patient under my direction.  Rush Farmer, M.D. [facility] Pulmonary/Critical Care Medicine. Pager: [phone]. After hours pager: [phone].

## 2016-09-04 NOTE — Progress Notes (Addendum)
[facility] for Infectious Disease    Date of Admission:  09/04/2016   Total days of antibiotics 9        Day 4 imipenem        (d/c'd tige, linezolid, anidula)   ID: Jason [name] is a 30 y.o. 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 Micro cx have found strep constellatus bacteremia, polymicrobial + infection of sacral osteo with ESBL kleb, amp S enterococcus, and strep constellatus.  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 ([name])   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([place])   History of pulmonary embolism   DVT (deep venous thrombosis) ([name])   Severe protein-calorie malnutrition ([name])   Dysphagia, pharyngoesophageal phase   Decubitus ulcer of right perineal ischial region, stage 4 ([name])   Decubitus ulcer of left perineal ischial region, stage 4 ([name])   Sacral decubitus ulcer, stage IV ([name])   Multiple allergies   Recurrent UTI   Respiratory failure, chronic ([name])   Aspiration pneumonia of right lower lobe ([name])   Seizures ([name])   Anxiety state   Anemia of chronic disease   Hyponatremia   Encephalopathy   Other acute osteomyelitis, multiple sites ([place])   Lower GI bleed   Viridans streptococci infection   Coma of unknown cause ([name])   Septic shock ([name])   Anaphylactic syndrome   Acute hypoxemic respiratory failure ([name])   Enterococcal infection    Subjective: Temp of 100.5 this am, remains on vent but weaning, more alert attempting to communicate more. His mom reports he is sleepy since getting fentanyl  Leukocytosis increased from 24 to 29K , he continues to receive decadron prior to iv abtx administration. Platelets at 38K down from  51 yesterday  Medications:  . baclofen  10 mg Oral QID  . budesonide (PULMICORT) nebulizer solution  0.5 mg Nebulization BID  . chlorhexidine gluconate (MEDLINE KIT)  15 mL Mouth Rinse BID  . imipenem-cilastatin  500 mg Intravenous Q8H   And  . dexamethasone  4 mg Intravenous Q8H  . insulin aspart  0-9 Units Subcutaneous TID WC  . ipratropium  0.5 mg Nebulization Q6H  . mouth rinse  15 mL Mouth Rinse QID  . midodrine  20 mg Per Tube TID WC  . neomycin-bacitracin-polymyxin   Topical Daily  . oxybutynin  5 mg Per Tube BID  . pantoprazole (PROTONIX) IV  40 mg Intravenous Q24H  . scopolamine  1 patch Transdermal Q72H    Objective: Vital signs in last 24 hours: Temp:  [98.1 F (36.7 C)-100.5 F (38.1 C)] 100.5 F (38.1 C) (11/28 1124) Pulse Rate:  [81-100] 100 (11/28 1140) Resp:  [20-37] 35 (11/28 1140) BP: (75-124)/(51-90) 124/83 (11/28 1140) SpO2:  [90 %-100 %] 100 % (11/28 1314) Arterial Line BP: (94-109)/(51-62) 101/55 (11/27 1745) FiO2 (%):  [40 %] 40 % (11/28 1314) Weight:  [126 lb (57.2 kg)] 126 lb (57.2 kg) (11/28 0500) Physical Exam  Constitutional: He is oriented to self.appears chronically ill. . No distress.  HENT:  Mouth/Throat: Oropharynx is clear and moist. On trach  Cardiovascular: Normal rate, regular rhythm and normal heart sounds. Exam reveals no gallop and no friction rub.  No murmur heard.  Pulmonary/Chest: Effort normal and breath sounds normal. No respiratory distress. He has no wheezes.  Ext: left  Forearm/ hand swelling Neurological: easily awakens  Left hand tremor, slow to follow commands, d     Lab Results  Recent Labs  09/03/16 0527  09/03/16 2300 09/04/16 0500  WBC 23.6*  < > 26.4* 29.3*  HGB 9.3*  < > 7.8* 7.7*  HCT 26.9*  < > 23.4* 23.4*  NA 141  --   --  144  K 3.6  --   --  3.8  CL 106  --   --  109  CO2 24  --   --  15*  BUN 21*  --   --  22*  CREATININE 0.56*  --   --  1.11  < > = values in this interval not  displayed.  Microbiology: 11/23 resp cx acinetobacter, and ESLB kleb pneumo 11/23 blood cx ngtd 11/22 tissue cx: ESBL kleb pneumo, amp S enterococcus, strep constellatus 11/19 blood cx x 3 sets strep constellatus 11/19 urine cx acinetobacter, e.faecalis Studies/Results: Ir Replc Gastro/colonic Tube Percut W/fluoro  Result Date: 09/03/2016 INDICATION: 30 year old male with malfunctioning gastrostomy tube. He has been referred for evaluation for replacement. EXAM: GASTROSTOMY CATHETER REPLACEMENT MEDICATIONS: None ANESTHESIA/SEDATION: None CONTRAST:  10 cc - administered into the gastric lumen. FLUOROSCOPY TIME:  Fluoroscopy Time: 0 minutes 18 seconds (0.5 mGy). COMPLICATIONS: None PROCEDURE: Informed written consent was obtained from the patient's family after a thorough discussion of the procedural risks, benefits and alternatives. All questions were addressed. Maximal Sterile Barrier Technique was utilized including caps, mask, sterile gowns, sterile gloves, sterile drape, hand hygiene and skin antiseptic. A timeout was performed prior to the initiation of the procedure. Patient is placed supine position on the fluoroscopy table. 035 wire was placed through the indwelling gastrostomy tube. Gastrostomy tube was removed with manual traction. Tube was removed in its entirety. Over the wire, a new 68 French balloon retention was placed. Wire was removed and the balloon is for latent with 8 cc of saline. Injection of contrast confirmed location within the stomach lumen. Patient tolerated the procedure well and remained hemodynamically stable throughout. No complications were encountered and no significant blood loss. IMPRESSION: Status post gastrostomy tube exchange, with placement of a new 53 French balloon retention tube. Signed, Dulcy Fanny. Earleen Newport, DO [facility] Radiology Electronically Signed   By: Corrie Mckusick D.O.   On: 09/03/2016 18:06   Dg Chest Port  1 View  Result Date: 09/04/2016 CLINICAL DATA:  Acute respiratory failure. History of asthma, previous CVA, former smoker. EXAM: PORTABLE CHEST 1 VIEW COMPARISON:  Portable chest x-ray of September 02, 2016 FINDINGS: The lungs are well-expanded. There are confluent interstitial and alveolar opacities in the left lung and to a lesser extent in the right upper lobe. There is tenting of the right hemidiaphragm. There is a small pleural effusion on the right. The cardiac silhouette is normal in size. The pulmonary vascularity is indistinct. The tracheostomy appliance tip projects between the clavicular heads. The right internal jugular venous catheter tip projects over the midportion of the SVC. The observed bony thorax exhibits no acute abnormality. IMPRESSION: Fairly stable bilateral interstitial and alveolar opacities consistent with pneumonia. Small right pleural effusion, stable. No overt CHF. Electronically Signed   By: David  Martinique M.D.   On: 09/04/2016 07:36     Assessment/Plan: Streptococcus constellatus bacteremia = would be covered by imipenem  Sacral osteo =  quite complex given burden of disease per discussion with dr Marla Roe. Not likely a surgical candidate for debridement due to extensive disease, frailty of bone. Will continue to treat with imipenem, though at risk for further fracturing. Continue to premedicate with decadron. Likely still causing low grade fever  ?vertebral osteo = lesions noted to lumbar spine suggestive of compression fracture but also other lesions. Once patient is stabilized can consider doing mri. IV abtx to be estimated for 6 wk  + ur cx = he has known colonization.  Thrombocytopenia = likely due to the  linezolid exposure vs. Compounded due to sepsis. Will continue to monitor cbc and platelets. He may have reached nadir. It will take roughly 7 days to start seeing improvement.   dvt = consider using arixstra instead of heparin  Baxter Flattery [facility] for Infectious Diseases Cell: [phone] Pager: [phone]  09/04/2016, 3:07 PM

## 2016-09-04 NOTE — Progress Notes (Signed)
ANTICOAGULATION CONSULT NOTE - Follow-up Consult  Pharmacy Consult for heparin  Indication: DVT  Allergies  Allergen Reactions  . Amikacin Sulfate Other (See Comments)    Seizure   . Cefuroxime Axetil Anaphylaxis    Tolerated cefepime 12/2015 - with Pepcid/Solu-Medrol  . Ertapenem Other (See Comments)    Rash and confusion-->tolerated Imipenem   . Morphine And Related Other (See Comments)    Changed mental status, confusion, headache, visual hallucination  . Nsaids Itching and Other (See Comments)    Risk of bleeding, itching  . Penicillins Anaphylaxis and Other (See Comments)    Tolerated Imipenem; no reaction to 7 day course of amoxicillin in 2015 Has patient had a PCN reaction causing immediate rash, facial/tongue/throat swelling, SOB or lightheadedness with hypotension: Yes Has patient had a PCN reaction causing severe rash involving mucus membranes or skin necrosis: No Has patient had a PCN reaction that required hospitalization Yes Has patient had a PCN reaction occurring within the last 10 years: No If all of the above answers are "NO", then may proceed with Cephalo  . Sulfa Antibiotics Anaphylaxis, Shortness Of Breath and Other (See Comments)  . Tessalon [Benzonatate] Anaphylaxis  . Aztreonam Swelling    He appears to tolerate if you premedicate with steroids. Had facial/neck swelling without premedication  . Levaquin [Levofloxacin In D5w] Swelling    Hand and forearm swelling  . Rocephin [Ceftriaxone] Other (See Comments)    Confusion  . Shellfish Allergy Itching and Other (See Comments)    Took benadryl to alleviate reaction  . Miripirium Rash and Other (See Comments)    Change in mental status   Patient Measurements: Height: 5\' 8"  (172.7 cm) Weight: 126 lb (57.2 kg) IBW/kg (Calculated) : 68.4 Heparin Dosing Weight: 56.2 kg    Assessment: 30 yo quadriplegic male on chronic vent admitted with lethargy. Patient also has chronic sacral and pelvic osteomyelitis. Has  hx of PE in 02/2015 s/p IVC filter. Has had thrombocytopenia this admission. Linezolid likely culprit, but plts have continued to drop since d/c of med. Transfused 2 units on 11/20. Started on heparin gtt on 11/27 for new DVT in right UE but now on hold due to plts < 50 and bleeding from PEG site. No further bleeding noted. Hgb low but stable at 7.7, plts low at 38 (Plts on admission were 503)   Goal of Therapy:  Heparin level 0.3-0.5 units/mL Monitor platelets by anticoagulation protocol: Yes   Plan:  Continue to hold heparin gtt Monitor CBC, platelets closely, s/s of bleed F/U restart of anticoag for DVT treatment when able  Elenor Quinones, PharmD, BCPS Clinical Pharmacist Pager [phone] 09/04/2016 8:07 AM

## 2016-09-05 LAB — CBC
HEMATOCRIT: 23.1 % — AB (ref 39.0–52.0)
Hemoglobin: 7.3 g/dL — ABNORMAL LOW (ref 13.0–17.0)
MCH: 27 pg (ref 26.0–34.0)
MCHC: 31.6 g/dL (ref 30.0–36.0)
MCV: 85.6 fL (ref 78.0–100.0)
Platelets: 43 10*3/uL — ABNORMAL LOW (ref 150–400)
RBC: 2.7 MIL/uL — AB (ref 4.22–5.81)
RDW: 17.8 % — AB (ref 11.5–15.5)
WBC: 21.1 10*3/uL — AB (ref 4.0–10.5)

## 2016-09-05 LAB — GLUCOSE, CAPILLARY
GLUCOSE-CAPILLARY: 272 mg/dL — AB (ref 65–99)
GLUCOSE-CAPILLARY: 276 mg/dL — AB (ref 65–99)
GLUCOSE-CAPILLARY: 257 mg/dL — AB (ref 65–99)
GLUCOSE-CAPILLARY: 279 mg/dL — AB (ref 65–99)
GLUCOSE-CAPILLARY: 327 mg/dL — AB (ref 65–99)
Glucose-Capillary: 272 mg/dL — ABNORMAL HIGH (ref 65–99)

## 2016-09-05 LAB — MAGNESIUM: Magnesium: 1.5 mg/dL — ABNORMAL LOW (ref 1.7–2.4)

## 2016-09-05 LAB — PHOSPHORUS: PHOSPHORUS: 5.9 mg/dL — AB (ref 2.5–4.6)

## 2016-09-05 LAB — BASIC METABOLIC PANEL
ANION GAP: 23 — AB (ref 5–15)
BUN: 36 mg/dL — ABNORMAL HIGH (ref 6–20)
CALCIUM: 7.8 mg/dL — AB (ref 8.9–10.3)
CO2: 10 mmol/L — AB (ref 22–32)
Chloride: 111 mmol/L (ref 101–111)
Creatinine, Ser: 1.44 mg/dL — ABNORMAL HIGH (ref 0.61–1.24)
Glucose, Bld: 290 mg/dL — ABNORMAL HIGH (ref 65–99)
POTASSIUM: 3.3 mmol/L — AB (ref 3.5–5.1)
Sodium: 144 mmol/L (ref 135–145)

## 2016-09-05 NOTE — Progress Notes (Addendum)
Inpatient Diabetes Program Recommendations  AACE/ADA: New Consensus Statement on Inpatient Glycemic Control (2015)  Target Ranges:  Prepandial:   less than 140 mg/dL      Peak postprandial:   less than 180 mg/dL (1-2 hours)      Critically ill patients:  140 - 180 mg/dL   Lab Results  Component Value Date   GLUCAP 272 (H) 09/05/2016   HGBA1C 8.0 (H) 07/04/2016    Review of Glycemic Control  Diabetes history: DM 1 Outpatient Diabetes medications: Lantus 22 units, Novolog 0-10 units QID  Current orders for Inpatient glycemic control: Novolog Sensitive TID  Inpatient Diabetes Program Recommendations:  Noted tube feeds started. While on tube feeds, please adjust Novolog Correction scale to Q4hours and consider adding Tube Feed Coverage Novolog 3 units Q4hours in addition to correction scale.  Thanks,  Tama Headings RN, MSN, [facility] Inpatient Diabetes Coordinator Team Pager [phone] (8a-5p)

## 2016-09-05 NOTE — Plan of Care (Signed)
Problem: Nutrition: Goal: Adequate nutrition will be maintained Outcome: Progressing Pt started back on tube feeding Jevity 1.2 @ 10 ml/hr increase 10 ml q4hr to meet goal 70 ml/hr.

## 2016-09-05 NOTE — Care Management Note (Signed)
Case Management Note  Patient Details  Name: EARMON DUECK MRN: [id number] Date of Birth: [birth year]  Subjective/Objective:   Pt lives with parents, [facility] provides 16 hr/day private duty nursing services.  Father stated that PCP will no longer provide scripts for the same level of pain medications as of January 1st due to new federal regulations, suggest family pursue hospice for pain management.  Discussed same with mother who states she will need to think about this option before making a decision.  Pt can still receive private duty nursing services with [facility] if family chooses hospice but cannot be on IV antibiotics.                             Expected Discharge Plan:  [employer]  Discharge planning Services  CM Consult  Post Acute Care Choice:  Home Health, Resumption of Svcs/PTA Provider  DME Arranged:  IV pump/equipment DME Agency:  [place] Arranged:  RN [facility] Agency:  [place]  Status of Service:  In process, will continue to follow  Girard Cooter, RN 09/05/2016, 3:49 PM

## 2016-09-06 DIAGNOSIS — R0682 Tachypnea, not elsewhere classified: Secondary | ICD-10-CM

## 2016-09-06 LAB — GLUCOSE, CAPILLARY
GLUCOSE-CAPILLARY: 348 mg/dL — AB (ref 65–99)
GLUCOSE-CAPILLARY: 338 mg/dL — AB (ref 65–99)
Glucose-Capillary: 365 mg/dL — ABNORMAL HIGH (ref 65–99)
Glucose-Capillary: 306 mg/dL — ABNORMAL HIGH (ref 65–99)

## 2016-09-06 LAB — CBC
HEMATOCRIT: 24.9 % — AB (ref 39.0–52.0)
HEMOGLOBIN: 8.2 g/dL — AB (ref 13.0–17.0)
MCH: 27.2 pg (ref 26.0–34.0)
MCHC: 32.9 g/dL (ref 30.0–36.0)
MCV: 82.5 fL (ref 78.0–100.0)
Platelets: 56 10*3/uL — ABNORMAL LOW (ref 150–400)
RBC: 3.02 MIL/uL — AB (ref 4.22–5.81)
RDW: 17.6 % — ABNORMAL HIGH (ref 11.5–15.5)
WBC: 41.8 10*3/uL — ABNORMAL HIGH (ref 4.0–10.5)

## 2016-09-06 MED ORDER — IPRATROPIUM BROMIDE 0.02 % IN SOLN
0.5000 mg | Freq: Two times a day (BID) | RESPIRATORY_TRACT | Status: DC
Start: 2016-09-06 — End: 2016-09-24
  Administered 2016-09-06 – 2016-09-24 (×37): 0.5 mg via RESPIRATORY_TRACT
  Filled 2016-09-06 (×37): qty 2.5

## 2016-09-06 MED ORDER — GLUCERNA 1.2 CAL PO LIQD
1000.0000 mL | ORAL | Status: DC
  Administered 2016-09-06 – 2016-09-10 (×5): 1000 mL
  Filled 2016-09-06 (×10): qty 1000

## 2016-09-06 NOTE — Progress Notes (Signed)
Inpatient Diabetes Program Recommendations  AACE/ADA: New Consensus Statement on Inpatient Glycemic Control (2015)  Target Ranges:  Prepandial:   less than 140 mg/dL      Peak postprandial:   less than 180 mg/dL (1-2 hours)      Critically ill patients:  140 - 180 mg/dL   Results for ABELARDO, HARTSOCK (MRN [id number]) as of 09/06/2016 08:27  Ref. Range 09/05/2016 09:58 09/05/2016 12:13 09/05/2016 16:39 09/05/2016 20:02 09/06/2016 07:26  Glucose-Capillary Latest Ref Range: 65 - 99 mg/dL 272 (H) 257 (H) 279 (H) 327 (H) 365 (H)   Review of Glycemic Control  Diabetes history: DM 1 Outpatient Diabetes medications: Lantus 22 units, Novolog 0-10 units QID  Current orders for Inpatient glycemic control: Novolog Sensitive TID  Inpatient Diabetes Program Recommendations:  Glucose 200-300 range please add Lantus 10 units Q24hours. Noted tube feeds started. While on tube feeds, please adjust Novolog Correction scale to Q4hours and consider adding Tube Feed Coverage Novolog 3 units Q4hours in addition to correction scale.  Thanks,  Tama Headings RN, MSN, [facility] Inpatient Diabetes Coordinator Team Pager [phone] (8a-5p)

## 2016-09-06 NOTE — Progress Notes (Signed)
Patient asked for suctioning at this time. Moderate amount of tan secretions obtained. Patient still wanting more suctioning. RT bagged and lavaged patient. Very little secretions obtained after this. Patient remains tachypneic with sats 100%. RN aware. Will continue to monitor.

## 2016-09-06 NOTE — Progress Notes (Signed)
Nutrition Follow-up  DOCUMENTATION CODES:   Not applicable  INTERVENTION:    D/C Jevity 1.2 formula   Initiate Glucerna 1.2 formula at 20 ml/hr and increase by 10 ml every 4 hours to goal rate of 70 ml/hr  TF regimen to provide 2016 kcal, 101 gm protein, 1352 ml free water daily  NUTRITION DIAGNOSIS:   Increased nutrient needs related to wound healing as evidenced by estimated needs, ongoing  GOAL:   Patient will meet greater than or equal to 90% of their needs, progressing  MONITOR:   TF tolerance, Vent status, Labs, Weight trends, Skin, I & O's  ASSESSMENT:   30 y.o. 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 Developed mild hypoxia with increasing lethargy and required intubation on 11/23.  Patient typically receives nocturnal TF with Glucerna 1.2 and eats regular foods during the day.  Patient is currently on ventilator support >> trach  MV:14.4 L/min Temp (24hrs), Avg:97.3 F (36.3 C), Min:96.4 F (35.8 C), Max:98.4 F (36.9 C)  Jevity 1.2 formula resumed 11/25 and currently infusing at 40 ml/hr via PEG tube. Progressive lethargy resolving but pt remains weak. Palliative Care Team following. ABX per Infectious Disease. CBG's [phone].  Diet Order:  Diet NPO time specified  Skin:  Wound (see comment) (Stg 2 R elbow, back; stg 2 L heel; stg 4 coccyx, buttocks)  Last BM:  11/30  Height:   Ht Readings from Last 1 Encounters:  08/12/2016 5\' 8"  (1.727 m)    Weight:   Wt Readings from Last 1 Encounters:  09/06/16 118 lb (53.5 kg)   11/29  128 lb 11/28  128 lb 11/27  124 lb 11/26  122  lb 11/19  121 lb  Ideal Body Weight:  63 kg (adjusted for quadriplegia)  BMI:  Body mass index is 17.94 kg/m.  Estimated Nutritional Needs:   Kcal:  2000  Protein:  90-105 gm  Fluid:  2 L  EDUCATION NEEDS:   No education needs identified at this time   Arthur Holms, RD, LDN Pager #: [phone] After-Hours Pager #: [phone]

## 2016-09-06 NOTE — Progress Notes (Signed)
Name: Jason [name] MRN: [id number] DOB: November 25, 1985    ADMISSION DATE:  08/25/2016 CONSULTATION DATE:  11/21  REFERRING MD :  [email or web address] ([facility])   CHIEF COMPLAINT:  Lethargy, vent mgmt   BRIEF PATIENT DESCRIPTION: 29 yo quadriplegic r/t C6 spinal cord injury, chronic trach, chronic infections in setting of open wounds sacrum and foot. Started on Azactam without premedication and he has become lethargic and has mild swelling around trach without compromise. Solumedrol has been started and he is HD stable. Mother reports this is his usual reaction to Azactam. PCCM saw 11/21 and signed off.  Called back 11/23 for worsening lethargy and request for vent support.    SIGNIFICANT EVENTS  11/22>> surgical bx sacral bone/wound  STUDIES:  11/21 ct head >>neg   ABx:  Per ID linezolid 11/19>>> 11/24 tigecycline 11/21>>> 11/24 Vanc >>11/25 >> 11/26 Eraxis >>11/24 >> 11/26 Imipenem >>11/24   MICRO:  BC x 2 11/23>>> Sacral bone 11/22>>> ESBL Kleb pna, Enterococcus faecalis, strep constellatus  SUBJECTIVE:   Lethargic   VITAL SIGNS: Temp:  [96.1 F (35.6 C)-98.4 F (36.9 C)] 98.4 F (36.9 C) (11/30 0731) Pulse Rate:  [73-85] 85 (11/30 0731) Resp:  [13-33] 25 (11/30 0731) BP: (63-110)/(44-81) 90/56 (11/30 0731) SpO2:  [90 %-100 %] 99 % (11/30 0731) FiO2 (%):  [40 %] 40 % (11/30 0433) Weight:  [118 lb (53.5 kg)] 118 lb (53.5 kg) (11/30 0446)  PHYSICAL EXAMINATION: General:  Chronically ill appearing male, NAD on vent  Neuro:  Awake, lethargic HEENT:  Mm moist, no JVD, chronic trach #6 cuffed c/d , rt I j cvl in place Cardiovascular:  s1s2 rrr Lungs:  resps even non labored on vent, scattered rhonchi on full vent support Abdomen:  Round, soft, dec BS. anasarcous Musculoskeletal:  Warm and dry, large sacral decub, dressed. Anasarcous    Recent Labs Lab 09/03/16 0527 09/04/16 0500 09/05/16 0500  NA 141 144 144  K 3.6 3.8 3.3*  CL 106 109 111  CO2 24 15* 10*  BUN 21*  22* 36*  CREATININE 0.56* 1.11 1.44*  GLUCOSE 100* 164* 290*    Recent Labs Lab 09/04/16 0500 09/05/16 0500 09/06/16 0228  HGB 7.7* 7.3* 8.2*  HCT 23.4* 23.1* 24.9*  WBC 29.3* 21.1* 41.8*  PLT 38* 43* 56*   No results found.  ASSESSMENT / PLAN:  Acute on chronic trach dependent respiratory failure Acute Hypoxemic resp failure 2/2 shock/severe sepsis/ concerned for HCAP and Pulm edema. Improved, Chronic trach  Previous chronic vent - has not required qhs vent in ~8 months per mom  PLAN -  Vent support - 8cc/kg. Cutting down PEEP from 8 >> 5. Try for T-collar but he remains weak Cont abx per ID.    Pulmonary hygiene as able for mucus plugging BD's   S/P Severe sepsis/septic shock.  - multifactorial in setting multiple wounds, osteomyelitis +/- septic arthritis +/- HCAP.  Pt with K pneumo and E faecalis in bone culture Pt with strep viridans bacteremia Adrenal insufficiency Chronic hypotension  PLAN -  abx as mentioned -  Imipenem per ID. Clinically improved with abx and fluids. 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 Multifactorial  but mostly 2/2 severe sepsis/septic shock since 11/22, resolving.  PLAN - Hold off MRI as pt's mental status more responsive but not at baseline Fentanyl and ativan prn.   Lower GI bleeding  Blood loss anemia   Recent Labs  09/05/16 0500 09/06/16 0228  HGB 7.3* 8.2*    Diarrhea - improved  PLAN -  GI following  PPI  Pt with clogged PEG  IR . They will work on de clogging the PEG    AKI > resolved.  Metabolic acidosis > resolved Lactic acidosis > improved PLAN -  Per [facility]  Acute DVT  in R Subclavian Vein and R brachial vein (09/03/16). Likely related to PICC which was removed 11/22.  PLAN - PICC has been removed. Rt ij cvl placed No heparin drip, no bolus. Stopped 11/27 for low hgb/thrombocytopenia May need a repeat Dopplers of RUE on 12/4 > if DVT is resolved as PICC has been removed, no need for heparin.    Critical care time with this patient : 30 minutes. Mother updated on 11/30.   Father updated on 11/28.  Pccm will see weekly   [name] Minor ACNP [name] PCCM Pager [phone] till 3 pm If no answer page [phone] 09/06/2016, 7:42 AM  Attending Note:  30 year old male s/p traumatic spinal cord injury that is a quad and is vent dependent via trach who presents with respiratory failure acute on chronic due to HCAP with associated sepsis and shock.  On exam, contracted with coarse BS diffusely.  I reviewed CXR myself, trach in good position and infiltrate noted.  Discussed with PCCM-NP.  Patient more tachypneic this AM and I suspect due to pain management issues rather than true respiratory distress.  Acute on chronic respiratory failure:             - Vent settings designated.             - No need for further ABGs at this point  - Narcotics for pain control.  Trach status:             - Maintain current trach type and size.  HCAP:             - Abx as ordered.             - F/u on cultures.  Hypoxemia:             - Titrate O2 for sat of 88-92%.  PCCM will see on T/Th.  Patient seen and examined, agree with above note.  I dictated the care and orders written for this patient under my direction.  Rush Farmer, M.D. [facility] Pulmonary/Critical Care Medicine. Pager: [phone]. After hours pager: [phone].

## 2016-09-06 NOTE — Plan of Care (Signed)
Problem: Pain Managment: Goal: General experience of comfort will improve Outcome: Progressing Patient requested pain medication frequently on day shift. Complaining of generalized pain.   Problem: Nutrition: Goal: Adequate nutrition will be maintained Outcome: Progressing Patient TF changed to Glucerna today. Also discussed placement of new G tube with IR. Patient was having leaking, but advised by IR to pull plunger towards skin and push disc down. No problems with leaking after this adjustment. Continuing to monitor. Patient rate adjusted to goal of 60.

## 2016-09-06 NOTE — Progress Notes (Signed)
[facility] for Infectious Disease    Date of Admission:  08/17/2016   Total days of antibiotics 11        Day 6  imipenem        (d/c'd tige, linezolid, anidula)   ID: Jason [name] is a 30 y.o. 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 Micro cx have found strep constellatus bacteremia, polymicrobial + infection of sacral osteo with ESBL kleb, amp S enterococcus, and strep constellatus.  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 ([name])   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([place])   History of pulmonary embolism   DVT (deep venous thrombosis) ([name])   Severe protein-calorie malnutrition ([name])   Dysphagia, pharyngoesophageal phase   Decubitus ulcer of right perineal ischial region, stage 4 ([name])   Decubitus ulcer of left perineal ischial region, stage 4 ([name])   Sacral decubitus ulcer, stage IV ([name])   Multiple allergies   Recurrent UTI   Respiratory failure, chronic ([name])   Aspiration pneumonia of right lower lobe ([name])   Seizures ([name])   Anxiety state   Anemia of chronic disease   Hyponatremia   Encephalopathy   Other acute osteomyelitis, multiple sites ([place])   Lower GI bleed   Viridans streptococci infection   Coma of unknown cause ([name])   Septic shock ([name])   Anaphylactic syndrome   Acute hypoxemic respiratory failure ([name])   Enterococcal infection    Subjective: Temp of 100.7 today, but afebrile yesterday., remains on vent but weaning, since he is having increased tannish secretions   Leukocytosis increased from 24 to 42K , he continues to receive decadron prior to iv abtx administration. Platelets at 56K  Medications:  . baclofen   10 mg Oral QID  . budesonide (PULMICORT) nebulizer solution  0.5 mg Nebulization BID  . chlorhexidine gluconate (MEDLINE KIT)  15 mL Mouth Rinse BID  . imipenem-cilastatin  500 mg Intravenous Q8H   And  . dexamethasone  4 mg Intravenous Q8H  . insulin aspart  0-9 Units Subcutaneous TID WC  . ipratropium  0.5 mg Nebulization BID  . mouth rinse  15 mL Mouth Rinse QID  . midodrine  20 mg Per Tube TID WC  . neomycin-bacitracin-polymyxin   Topical Daily  . oxybutynin  5 mg Per Tube BID  . pantoprazole (PROTONIX) IV  40 mg Intravenous Q24H  . scopolamine  1 patch Transdermal Q72H    Objective: Vital signs in last 24 hours: Temp:  [96.4 F (35.8 C)-100.7 F (38.2 C)] 100.7 F (38.2 C) (11/30 2100) Pulse Rate:  [78-102] 102 (11/30 2100) Resp:  [13-36] 25 (11/30 2100) BP: (70-110)/(54-81) 99/69 (11/30 2100) SpO2:  [90 %-100 %] 92 % (11/30 2100) FiO2 (%):  [40 %] 40 % (11/30 1952) Weight:  [118 lb (53.5 kg)] 118 lb (53.5 kg) (11/30 0446) Physical Exam  Constitutional: He is oriented to self, briefly opens eyes.appears chronically ill. . No distress.  HENT:  Mouth/Throat: Oropharynx is clear and moist. On trach -thick secretions noted Cardiovascular: Normal rate, regular rhythm and normal heart sounds. Exam reveals no gallop and no friction rub.  No murmur heard.  Pulmonary/Chest: Effort normal and breath sounds  bilateral rhonchi Ext: left  Forearm/ hand swelling is decreased Neurological: easily awakens    skin = scattered pressure ulcers to legs and arms. He has large sacral wound bandaged presently     Lab Results  Recent Labs  09/04/16 0500 09/05/16 0500 09/06/16 0228  WBC 29.3* 21.1* 41.8*  HGB 7.7* 7.3* 8.2*  HCT 23.4* 23.1* 24.9*  NA 144 144  --   K 3.8 3.3*  --   CL 109 111  --   CO2 15* 10*  --   BUN 22* 36*  --   CREATININE 1.11 1.44*  --     Microbiology: 11/23 resp cx acinetobacter, and ESLB kleb pneumo 11/23 blood cx ngtd 11/22 tissue cx: ESBL kleb  pneumo, amp S enterococcus, strep constellatus 11/19 blood cx x 3 sets strep constellatus 11/19 urine cx acinetobacter, e.faecalis Studies/Results: No results found.   Assessment/Plan: Fevers/leukocytosis= thought to be due to sacral osteo/sacral wound. Will place order sputum aspirate for culture since appears to have increasing secretions. Will not change abtx yet. Await for more micro data  Streptococcus constellatus bacteremia = would be covered by imipenem  Sacral osteo = quite complex given burden of disease per discussion with dr dillingham. Not likely a surgical candidate for debridement due to extensive disease, frailty of bone. Will continue to treat with imipenem, though at risk for further fracturing. Continue to premedicate with decadron. Sacral osteo thought to be source of his fevers vs. New source such as pneumonia  ?vertebral osteo = lesions noted to lumbar spine suggestive of compression fracture but also other lesions. Once patient is stabilized can consider doing mri. IV abtx to be estimated for 6 wk  + ur cx = he has known colonization.  Thrombocytopenia = likely due to the  linezolid exposure vs. Compounded due to sepsis. Will continue to monitor cbc and platelets. He has reached nadir but starting to improve slowly. It will take roughly 7 days to get back to baseline  dvt = consider using arixstra instead of heparin  ,  [facility] Cell: [phone] Pager: [phone]  09/06/2016, 10:29 PM      

## 2016-09-06 NOTE — Progress Notes (Signed)
Occupational Therapy Treatment Patient Details Name: Jason [name] MRN: [id number] DOB: [birth year] Today's Date: 09/06/2016    History of present illness Jason [name] is a 30 y.o. male admitted on [id number] with complicated PMH now presenting with sepsis from chronic osteomyelitis vs UTI vs new abscess. PMH: quadriplegia with resulting sacral decub, chronic trach, ostomy, suprapubic catheter, recurrent UTI, hx seizures, DM, gastroparesis, hx PE/DVT, anxiety, anemaia   OT comments  Pt seen for BUE strengthening. Pt with improved participation and level of alertness as compared to initial eval. Recommend keeping BUE elevated to reduce dependent edema. Recommend placing pt in a modified chair position in bed as tolerated to improve respiratory status. Will continue to follow.   Follow Up Recommendations  Supervision/Assistance - 24 hour    Equipment Recommendations  None recommended by OT    Recommendations for Other Services      Precautions / Restrictions Precautions Precautions: Fall Precaution Comments: multiple wounds, trach. suprapubic catheter Required Braces or Orthoses: Other Brace/Splint (prevalon boots)       Mobility Bed Mobility                  Transfers                      Balance                                   ADL Overall ADL's : Needs assistance/impaired                                       General ADL Comments: total A with all ADL      Vision                     Perception     Praxis      Cognition   Behavior During Therapy: Flat affect Overall Cognitive Status: Impaired/Different from baseline Area of Impairment: Following commands;Attention;Problem solving   Current Attention Level: Sustained    Following Commands: Follows one step commands inconsistently     Problem Solving: Slow processing;Difficulty sequencing General Comments: more alert and participatory than  eval    Extremity/Trunk Assessment               Exercises Shoulder Exercises Elbow Extension: PROM;Both;10 reps Wrist Extension: AROM Low Level/ICU Exercises Shoulder Flexion: AAROM;Both;10 reps;Supine Elbow Flexion: AAROM;AROM;Both;10 reps;Supine Shoulder Press: AROM;AAROM;Both;10 reps;Supine Other Exercises Other Exercises: BUE A/AA/PROM  - mom educated on all exercises Other Exercises: B prevalon boots adjusted properly. - mom educated on proper fit.   Shoulder Instructions       General Comments      Pertinent Vitals/ Pain       Pain Assessment: Faces Faces Pain Scale: Hurts even more Pain Location: "all over" Pain Descriptors / Indicators: Grimacing Pain Intervention(s): Limited activity within patient's tolerance;Repositioned;Patient requesting pain meds-RN notified  Home Living                                          Prior Functioning/Environment              Frequency  Min 2X/week        Progress Toward Goals  OT  Goals(current goals can now be found in the care plan section)  Progress towards OT goals: Progressing toward goals;OT to reassess next treatment  Acute Rehab OT Goals Patient Stated Goal: unable to state. Per mom - to return to PLOF before he had his wounds OT Goal Formulation: With family Time For Goal Achievement: 09/10/16 Potential to Achieve Goals: Fair ADL Goals Pt Will Perform Eating: with min assist;with caregiver independent in assisting;with assist to don/doff brace/orthosis;with adaptive utensils (DC goal. Pt NPO 11/30) Pt/caregiver will Perform Home Exercise Program: Increased ROM;Increased strength;Both right and left upper extremity;With minimal assist  Plan Discharge plan remains appropriate    Co-evaluation                 End of Session Equipment Utilized During Treatment: Other (comment) (vent)   Activity Tolerance Patient limited by lethargy   Patient Left in bed;with call  bell/phone within reach;with family/visitor present   Nurse Communication Mobility status;Patient requests pain meds        Time: [id number] OT Time Calculation (min): 28 min  Charges: OT General Charges $OT Visit: 1 Procedure OT Treatments $Therapeutic Exercise: 23-37 mins  Keita Demarco,HILLARY 09/06/2016, 2:21 PM   [facility], OTR/L  [phone] 09/06/2016

## 2016-09-06 NOTE — Progress Notes (Signed)
Patient T elevated to 100.7, no PRN for fever available to give. 3 blankets and multiple other linens removed from over top of patient. Will continue to monitor patient closely.

## 2016-09-06 NOTE — Progress Notes (Signed)
Attempted to call IR to make aware that pts tube feed is leaking around site but closed for the day. Will monitor for now and make day shift aware of issue.

## 2016-09-07 ENCOUNTER — Inpatient Hospital Stay (HOSPITAL_COMMUNITY): Payer: Medicaid Other

## 2016-09-07 DIAGNOSIS — D696 Thrombocytopenia, unspecified: Secondary | ICD-10-CM

## 2016-09-07 DIAGNOSIS — Y95 Nosocomial condition: Secondary | ICD-10-CM

## 2016-09-07 DIAGNOSIS — J984 Other disorders of lung: Secondary | ICD-10-CM

## 2016-09-07 DIAGNOSIS — G9511 Acute infarction of spinal cord (embolic) (nonembolic): Secondary | ICD-10-CM

## 2016-09-07 LAB — CBC
HCT: 23.5 % — ABNORMAL LOW (ref 39.0–52.0)
HEMOGLOBIN: 7.8 g/dL — AB (ref 13.0–17.0)
MCH: 26.5 pg (ref 26.0–34.0)
MCHC: 33.2 g/dL (ref 30.0–36.0)
MCV: 79.9 fL (ref 78.0–100.0)
PLATELETS: 60 10*3/uL — AB (ref 150–400)
RBC: 2.94 MIL/uL — AB (ref 4.22–5.81)
RDW: 17.1 % — ABNORMAL HIGH (ref 11.5–15.5)
WBC: 15 10*3/uL — ABNORMAL HIGH (ref 4.0–10.5)

## 2016-09-07 LAB — BASIC METABOLIC PANEL
Anion gap: 12 (ref 5–15)
BUN: 22 mg/dL — AB (ref 6–20)
CHLORIDE: 113 mmol/L — AB (ref 101–111)
CO2: 23 mmol/L (ref 22–32)
CREATININE: 0.7 mg/dL (ref 0.61–1.24)
Calcium: 7.8 mg/dL — ABNORMAL LOW (ref 8.9–10.3)
Glucose, Bld: 393 mg/dL — ABNORMAL HIGH (ref 65–99)
Potassium: 2.8 mmol/L — ABNORMAL LOW (ref 3.5–5.1)
SODIUM: 148 mmol/L — AB (ref 135–145)

## 2016-09-07 LAB — GLUCOSE, CAPILLARY
GLUCOSE-CAPILLARY: 326 mg/dL — AB (ref 65–99)
GLUCOSE-CAPILLARY: 401 mg/dL — AB (ref 65–99)
Glucose-Capillary: 352 mg/dL — ABNORMAL HIGH (ref 65–99)
Glucose-Capillary: 380 mg/dL — ABNORMAL HIGH (ref 65–99)
Glucose-Capillary: 386 mg/dL — ABNORMAL HIGH (ref 65–99)
Glucose-Capillary: 441 mg/dL — ABNORMAL HIGH (ref 65–99)

## 2016-09-07 MED ORDER — INSULIN ASPART 100 UNIT/ML ~~LOC~~ SOLN
0.0000 [IU] | Freq: Four times a day (QID) | SUBCUTANEOUS | Status: DC
  Administered 2016-09-07: 15 [IU] via SUBCUTANEOUS
  Administered 2016-09-08: 8 [IU] via SUBCUTANEOUS
  Administered 2016-09-08 (×2): 3 [IU] via SUBCUTANEOUS

## 2016-09-07 MED ORDER — INSULIN GLARGINE 100 UNIT/ML ~~LOC~~ SOLN: 20.0000 [IU] | Freq: Every day | SUBCUTANEOUS | Status: DC

## 2016-09-07 MED ORDER — INSULIN ASPART 100 UNIT/ML ~~LOC~~ SOLN
0.0000 [IU] | SUBCUTANEOUS | Status: DC
  Administered 2016-09-07: 12 [IU] via SUBCUTANEOUS
  Administered 2016-09-07: 9 [IU] via SUBCUTANEOUS

## 2016-09-07 MED ORDER — INSULIN GLARGINE 100 UNIT/ML ~~LOC~~ SOLN
12.0000 [IU] | Freq: Every day | SUBCUTANEOUS | Status: DC
  Administered 2016-09-07 – 2016-09-12 (×6): 12 [IU] via SUBCUTANEOUS
  Filled 2016-09-07 (×7): qty 0.12

## 2016-09-07 MED ORDER — FUROSEMIDE 10 MG/ML IJ SOLN
20.0000 mg | Freq: Once | INTRAMUSCULAR | Status: AC
  Administered 2016-09-07: 20 mg via INTRAVENOUS
  Filled 2016-09-07: qty 2

## 2016-09-07 MED ORDER — POTASSIUM CHLORIDE CRYS ER 20 MEQ PO TBCR
40.0000 meq | EXTENDED_RELEASE_TABLET | ORAL | Status: AC
  Administered 2016-09-07 (×2): 40 meq via ORAL
  Filled 2016-09-07 (×2): qty 2

## 2016-09-07 MED ORDER — INSULIN GLARGINE 100 UNIT/ML ~~LOC~~ SOLN: 15.0000 [IU] | Freq: Every day | SUBCUTANEOUS | Status: DC

## 2016-09-07 MED ORDER — CHLORHEXIDINE GLUCONATE 0.12 % MT SOLN
OROMUCOSAL | Status: AC
  Filled 2016-09-07: qty 15

## 2016-09-07 MED ORDER — BACLOFEN 10 MG PO TABS
10.0000 mg | ORAL_TABLET | Freq: Four times a day (QID) | ORAL | Status: DC
  Administered 2016-09-07 – 2016-10-05 (×102): 10 mg via ORAL
  Filled 2016-09-07 (×101): qty 1

## 2016-09-07 NOTE — Consult Note (Signed)
INFECTIOUS DISEASE PROGRESS NOTE  ID: Jason [name] is a 30 y.o. 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 ([place])   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([place])   History of pulmonary embolism   DVT (deep venous thrombosis) ([name])   Severe protein-calorie malnutrition ([name])   Dysphagia, pharyngoesophageal phase   Decubitus ulcer of right perineal ischial region, stage 4 ([name])   Decubitus ulcer of left perineal ischial region, stage 4 ([name])   Sacral decubitus ulcer, stage IV ([name])   Multiple allergies   Recurrent UTI   Respiratory failure, chronic ([name])   Aspiration pneumonia of right lower lobe ([name])   Seizures ([name])   Anxiety state   Anemia of chronic disease   Hyponatremia   Encephalopathy   Other acute osteomyelitis, multiple sites ([place])   Lower GI bleed   Viridans streptococci infection   Coma of unknown cause ([name])   Septic shock ([name])   Anaphylactic syndrome   Acute hypoxemic respiratory failure ([name])   Enterococcal infection   Thrombocytopenia ([name])  Subjective: Awake and alert.   Abtx:  Anti-infectives    Start     Dose/Rate Route Frequency Ordered Stop   09/01/16 1300  anidulafungin (ERAXIS) 100 mg in sodium chloride 0.9 % 100 mL IVPB  Status:  Discontinued     100 mg over 90 Minutes Intravenous Every 24 hours 08/31/16 1240 09/02/16 1252   09/01/16 1200  vancomycin (VANCOCIN) IVPB 750 mg/150 ml premix  Status:  Discontinued     750 mg 150 mL/hr over 60 Minutes Intravenous Every 24 hours 09/01/16 1145 09/01/16 1712   08/31/16 1700  imipenem-cilastatin (PRIMAXIN) 500 mg in sodium chloride 0.9 % 100 mL IVPB     500 mg 200 mL/hr over 30 Minutes Intravenous Every 8 hours 08/31/16 1240     08/31/16 1300  anidulafungin (ERAXIS) 200 mg in sodium chloride 0.9 % 200 mL IVPB     200 mg over 180 Minutes Intravenous  Once  08/31/16 1240 08/31/16 1810   08/12/2016 0557  tigecycline (TYGACIL) 50 mg in sodium chloride 0.9 % 100 mL IVPB  Status:  Discontinued     50 mg 200 mL/hr over 30 Minutes Intravenous Every 12 hours 08/28/16 1758 08/31/16 1151   08/28/16 1800  tigecycline (TYGACIL) 100 mg in sodium chloride 0.9 % 100 mL IVPB  Status:  Discontinued     100 mg 200 mL/hr over 30 Minutes Intravenous  Once 08/28/16 1758 08/31/16 1424   08/28/16 1300  linezolid (ZYVOX) IVPB 600 mg  Status:  Discontinued     600 mg 300 mL/hr over 60 Minutes Intravenous Every 12 hours 08/28/16 1239 08/31/16 2014   08/28/16 0200  aztreonam (AZACTAM) 1 g in dextrose 5 % 50 mL IVPB  Status:  Discontinued     1 g 100 mL/hr over 30 Minutes Intravenous Every 8 hours 08/27/16 1802 08/28/16 1232   08/27/16 2000  vancomycin (VANCOCIN) 500 mg in sodium chloride 0.9 % 100 mL IVPB  Status:  Discontinued     500 mg 100 mL/hr over 60 Minutes Intravenous Every 12 hours 08/27/16 1802 08/28/16 1239   08/27/16 1800  aztreonam (AZACTAM) 2 g in dextrose 5 % 50 mL IVPB     2 g 100 mL/hr over 30 Minutes Intravenous  Once 08/27/16 1634 08/27/16 1852   08/27/16  1600  levofloxacin (LEVAQUIN) IVPB 750 mg  Status:  Discontinued     750 mg 100 mL/hr over 90 Minutes Intravenous Every 24 hours 08/17/2016 1324 09/01/2016 1522   08/27/16 0000  vancomycin (VANCOCIN) 500 mg in sodium chloride 0.9 % 100 mL IVPB  Status:  Discontinued     500 mg 100 mL/hr over 60 Minutes Intravenous Every 12 hours 09/02/2016 1324 08/21/2016 1522   09/01/2016 2200  aztreonam (AZACTAM) 1 g in dextrose 5 % 50 mL IVPB  Status:  Discontinued     1 g 100 mL/hr over 30 Minutes Intravenous Every 8 hours 08/15/2016 1324 08/24/2016 1522   08/20/2016 1600  linezolid (ZYVOX) tablet 600 mg  Status:  Discontinued     600 mg Oral Every 12 hours 08/17/2016 1458 08/27/16 1347   08/20/2016 1300  levofloxacin (LEVAQUIN) IVPB 750 mg  Status:  Discontinued     750 mg 100 mL/hr over 90 Minutes Intravenous  Once 08/31/2016  1246 09/02/2016 1522   08/25/2016 1300  aztreonam (AZACTAM) 2 g in dextrose 5 % 50 mL IVPB  Status:  Discontinued     2 g 100 mL/hr over 30 Minutes Intravenous  Once 08/25/2016 1246 09/01/2016 1522   08/15/2016 1300  vancomycin (VANCOCIN) IVPB 1000 mg/200 mL premix  Status:  Discontinued     1,000 mg 200 mL/hr over 60 Minutes Intravenous  Once 08/09/2016 1246 09/03/2016 1522      Medications:  Scheduled: . baclofen  10 mg Oral QID  . budesonide (PULMICORT) nebulizer solution  0.5 mg Nebulization BID  . chlorhexidine gluconate (MEDLINE KIT)  15 mL Mouth Rinse BID  . imipenem-cilastatin  500 mg Intravenous Q8H   And  . dexamethasone  4 mg Intravenous Q8H  . insulin aspart  0-9 Units Subcutaneous Q4H  . ipratropium  0.5 mg Nebulization BID  . mouth rinse  15 mL Mouth Rinse QID  . midodrine  20 mg Per Tube TID WC  . neomycin-bacitracin-polymyxin   Topical Daily  . oxybutynin  5 mg Per Tube BID  . pantoprazole (PROTONIX) IV  40 mg Intravenous Q24H  . scopolamine  1 patch Transdermal Q72H    Objective: Vital signs in last 24 hours: Temp:  [98.7 F (37.1 C)-100.7 F (38.2 C)] 100 F (37.8 C) (12/01 1300) Pulse Rate:  [86-103] 88 (12/01 1300) Resp:  [19-29] 29 (12/01 1300) BP: (89-123)/(61-93) 104/75 (12/01 1300) SpO2:  [92 %-100 %] 97 % (12/01 1300) FiO2 (%):  [40 %] 40 % (12/01 1300) Weight:  [54 kg (119 lb)] 54 kg (119 lb) (12/01 0500)   General appearance: alert, cooperative and no distress Neck: R IJ Resp: diminished breath sounds anterior - bilateral Cardio: regular rate and rhythm GI: normal findings: bowel sounds normal and soft, non-tender Extremities: wasting.   Lab Results  Recent Labs  09/05/16 0500 09/06/16 0228 09/07/16 0342  WBC 21.1* 41.8* 15.0*  HGB 7.3* 8.2* 7.8*  HCT 23.1* 24.9* 23.5*  NA 144  --  148*  K 3.3*  --  2.8*  CL 111  --  113*  CO2 10*  --  23  BUN 36*  --  22*  CREATININE 1.44*  --  0.70   Liver Panel No results for input(s): PROT, ALBUMIN,  AST, ALT, ALKPHOS, BILITOT, BILIDIR, IBILI in the last 72 hours. Sedimentation Rate No results for input(s): ESRSEDRATE in the last 72 hours. C-Reactive Protein No results for input(s): CRP in the last 72 hours.  Microbiology: Recent Results (from  the past 240 hour(s))  Aerobic/Anaerobic Culture (surgical/deep wound)     Status: None   Collection Time: 08/10/2016  4:01 PM  Result Value Ref Range Status   Specimen Description TISSUE BONE  Final   Special Requests SACRAL  Final   Gram Stain   Final    ABUNDANT WBC PRESENT, PREDOMINANTLY PMN ABUNDANT GRAM NEGATIVE RODS ABUNDANT GRAM POSITIVE COCCI IN PAIRS FEW GRAM POSITIVE RODS    Culture   Final    MODERATE KLEBSIELLA PNEUMONIAE Confirmed Extended Spectrum Beta-Lactamase Producer (ESBL) MODERATE ENTEROCOCCUS FAECALIS MODERATE STREPTOCOCCUS CONSTELLATUS MIXED ANAEROBIC FLORA PRESENT.  CALL LAB IF FURTHER IID REQUIRED.    Report Status 09/03/2016 FINAL  Final   Organism ID, Bacteria KLEBSIELLA PNEUMONIAE  Final   Organism ID, Bacteria ENTEROCOCCUS FAECALIS  Final      Susceptibility   Enterococcus faecalis - MIC*    AMPICILLIN <=2 SENSITIVE Sensitive     VANCOMYCIN 1 SENSITIVE Sensitive     GENTAMICIN SYNERGY SENSITIVE Sensitive     * MODERATE ENTEROCOCCUS FAECALIS   Klebsiella pneumoniae - MIC*    AMPICILLIN >=32 RESISTANT Resistant     CEFAZOLIN >=64 RESISTANT Resistant     CEFEPIME >=64 RESISTANT Resistant     CEFTAZIDIME >=64 RESISTANT Resistant     CEFTRIAXONE >=64 RESISTANT Resistant     CIPROFLOXACIN 2 INTERMEDIATE Intermediate     GENTAMICIN >=16 RESISTANT Resistant     IMIPENEM <=0.25 SENSITIVE Sensitive     TRIMETH/SULFA >=320 RESISTANT Resistant     AMPICILLIN/SULBACTAM >=32 RESISTANT Resistant     PIP/TAZO >=128 RESISTANT Resistant     Extended ESBL POSITIVE Resistant     * MODERATE KLEBSIELLA PNEUMONIAE  Culture, blood (Routine X 2) w Reflex to ID Panel     Status: None   Collection Time: 08/30/16  9:30 AM    Result Value Ref Range Status   Specimen Description BLOOD LEFT ANTECUBITAL  Final   Special Requests BOTTLES DRAWN AEROBIC ONLY 5CC  Final   Culture NO GROWTH 5 DAYS  Final   Report Status 09/04/2016 FINAL  Final  Culture, blood (Routine X 2) w Reflex to ID Panel     Status: None   Collection Time: 08/30/16  9:35 AM  Result Value Ref Range Status   Specimen Description BLOOD BLOOD LEFT ARM  Final   Special Requests BOTTLES DRAWN AEROBIC ONLY 5CC  Final   Culture NO GROWTH 5 DAYS  Final   Report Status 09/04/2016 FINAL  Final  Culture, respiratory (NON-Expectorated)     Status: None   Collection Time: 08/30/16  3:56 PM  Result Value Ref Range Status   Specimen Description TRACHEAL ASPIRATE  Final   Special Requests NONE  Final   Gram Stain   Final    ABUNDANT WBC PRESENT,BOTH PMN AND MONONUCLEAR MODERATE GRAM POSITIVE COCCI IN PAIRS FEW GRAM VARIABLE ROD FEW YEAST    Culture   Final    MODERATE KLEBSIELLA PNEUMONIAE MODERATE ACINETOBACTER CALCOACETICUS/BAUMANNII COMPLEX Confirmed Extended Spectrum Beta-Lactamase Producer (ESBL) for KLEBSIELLA PNEUMONIAE    Report Status 09/02/2016 FINAL  Final   Organism ID, Bacteria KLEBSIELLA PNEUMONIAE  Final   Organism ID, Bacteria ACINETOBACTER CALCOACETICUS/BAUMANNII COMPLEX  Final      Susceptibility   Acinetobacter calcoaceticus/baumannii complex - MIC*    CEFTAZIDIME >=64 RESISTANT Resistant     CEFTRIAXONE >=64 RESISTANT Resistant     CIPROFLOXACIN >=4 RESISTANT Resistant     GENTAMICIN 4 SENSITIVE Sensitive     IMIPENEM 1 SENSITIVE Sensitive  PIP/TAZO >=128 RESISTANT Resistant     TRIMETH/SULFA >=320 RESISTANT Resistant     CEFEPIME >=64 RESISTANT Resistant     AMPICILLIN/SULBACTAM 4 SENSITIVE Sensitive     * MODERATE ACINETOBACTER CALCOACETICUS/BAUMANNII COMPLEX   Klebsiella pneumoniae - MIC*    AMPICILLIN >=32 RESISTANT Resistant     CEFAZOLIN >=64 RESISTANT Resistant     CEFEPIME >=64 RESISTANT Resistant      CEFTAZIDIME 16 RESISTANT Resistant     CEFTRIAXONE >=64 RESISTANT Resistant     CIPROFLOXACIN 1 SENSITIVE Sensitive     GENTAMICIN >=16 RESISTANT Resistant     IMIPENEM <=0.25 SENSITIVE Sensitive     TRIMETH/SULFA >=320 RESISTANT Resistant     AMPICILLIN/SULBACTAM >=32 RESISTANT Resistant     PIP/TAZO 8 SENSITIVE Sensitive     Extended ESBL POSITIVE Resistant     * MODERATE KLEBSIELLA PNEUMONIAE  Fungus Culture With Stain     Status: None   Collection Time: 08/30/16  3:56 PM  Result Value Ref Range Status   Fungus Stain Final report  Final   Fungus (Mycology) Culture Preliminary report  Final    Comment: (NOTE) Performed At: [facility] [place], [place] [id number] Lindon Romp MD [id number]    Fungal Source TRACHEAL ASPIRATE  Final  Fungus Culture Result     Status: None   Collection Time: 08/30/16  3:56 PM  Result Value Ref Range Status   Result 1 Yeast observed  Final    Comment: (NOTE) Performed At: [facility] [place], [place] [id number] Lindon Romp MD [id number]   Fungal organism reflex     Status: None   Collection Time: 08/30/16  3:56 PM  Result Value Ref Range Status   Fungal result 1 Candida albicans  Final    Comment: (NOTE) Light growth Performed At: [facility] [address] [place], [place] [id number] Lindon Romp MD [id number]   Culture, respiratory (NON-Expectorated)     Status: None   Collection Time: 08/31/16  1:20 AM  Result Value Ref Range Status   Specimen Description TRACHEAL ASPIRATE  Final   Special Requests NONE  Final   Gram Stain   Final    ABUNDANT WBC PRESENT,BOTH PMN AND MONONUCLEAR NO ORGANISMS SEEN    Culture   Final    MODERATE ACINETOBACTER CALCOACETICUS/BAUMANNII COMPLEX SUSCEPTIBILITIES PERFORMED ON PREVIOUS CULTURE WITHIN THE LAST 5 DAYS.    Report Status 09/04/2016 FINAL  Final  Culture, respiratory (NON-Expectorated)     Status: None (Preliminary  result)   Collection Time: 09/06/16 10:36 PM  Result Value Ref Range Status   Specimen Description TRACHEAL ASPIRATE  Final   Special Requests Normal  Final   Gram Stain   Final    MODERATE WBC PRESENT,BOTH PMN AND MONONUCLEAR FEW GRAM NEGATIVE RODS    Culture PENDING  Incomplete   Report Status PENDING  Incomplete    Studies/Results: Dg Chest Port 1 View  Result Date: 09/07/2016 CLINICAL DATA:  Acute onset of rhonchi.  Initial encounter. EXAM: PORTABLE CHEST 1 VIEW COMPARISON:  Chest radiograph performed 09/04/2016 FINDINGS: The patient's tracheostomy tube is seen ending 5-6 cm above the carina. A right IJ line is noted ending about the mid SVC. Vascular congestion is noted. Bilateral central airspace opacities may reflect pneumonia or pulmonary edema, more prominent than on the prior study. No definite pleural effusion or pneumothorax is seen. The cardiomediastinal silhouette is borderline normal in size. No acute osseous abnormalities are identified. IMPRESSION: Vascular congestion.  Bilateral central airspace opacities may reflect pulmonary edema or pneumonia, more prominent than on the prior study. Electronically Signed   By: Garald Balding M.D.   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   Acinetobacter  Klebsiella  Total days of antibiotics: 12 Imipenem day 8  WBC better today Has been afebrile Has been given steroids as pre-med for anbx due to multiple allergies.  His leukocytosis is most likely somewhat related to this.  Will need long term anbx for sacral osteo (6 weeks)          [name] Infectious Diseases (pager) [phone] [email or web address] 09/07/2016, 2:32 PM  LOS: 12 days

## 2016-09-07 NOTE — Progress Notes (Signed)
Patient CBG 441. Discussed with MD- Dr. Cruzita Lederer. Will administer 12 units and recheck.  Milford Cage, RN

## 2016-09-07 NOTE — Progress Notes (Signed)
Patient placed on 50% ATC for wean this morning. After about 10 minutes he became agitated and SOB and said being pulled up in the bed would help his breathing. Once pulled up, he dropped his sats in the 70's and became tachypneic. Patient placed back on the ventilator at this point on full support. Vitals normalized. Patient resting comfortably at this time.

## 2016-09-07 NOTE — Progress Notes (Signed)
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 In addition, the patient has been increasingly lethargic, with decreased urine output over the 3 days PTA with concerns for sepsis from his sacral decubitus ulcer. Mother reports that the patient had large volume of watery diarrhea.  He was admitted to SDU and Infectious Disease, Gastroenterology, and IR was consulted for further evaluation and management. His ostomy bleeding has stopped immediately after admission and with stable Hb GI held off colonoscopy and apparently has been stable since. Patient has had progressive lethargy and PCCM was consulted 11/21. Plastic surgery was consulted 11/21 and patient was eventually taken to OR on 11/22 for sacral bone biopsy. Patient's mental status continued to deteriorate and ws transferred to the ICU on 11/23. Neurology was consulted and felt like his encephalopathy is related to sepsis. With supportive treatment, antibiotics and vent support he continued to improve and TRH took over 12/1   Assessment & Plan: 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 ([name])   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([place])   History of pulmonary embolism   DVT (deep venous thrombosis) ([name])   Severe protein-calorie malnutrition ([name])   Dysphagia, pharyngoesophageal phase   Decubitus ulcer of right perineal ischial region, stage 4 ([name])   Decubitus ulcer  of left perineal ischial region, stage 4 ([name])   Sacral decubitus ulcer, stage IV ([name])   Multiple allergies   Recurrent UTI   Respiratory failure, chronic ([name])   Aspiration pneumonia of right lower lobe ([name])   Seizures ([name])   Anxiety state   Anemia of chronic disease   Hyponatremia   Encephalopathy   Other acute osteomyelitis, multiple sites ([place])   Lower GI bleed   Viridans streptococci infection   Coma of unknown cause ([name])   Septic shock ([name])   Anaphylactic syndrome   Acute hypoxemic respiratory failure ([name])   Enterococcal infection   Thrombocytopenia ([name])   Sepsis likely Multifactorial Etiologies with Evidence of Septic Arthritis within the Right SI Joint and Acute Osteomyelitis in the Adjacent Right illiac Bone with Changes of Multi Level Osteomyelitis throughout Vertebral Bodies of T8, L1, L2, L4, and L5 with Intramuscular Abscesses involving the Psoas, iliopsoas and iliacus muscles on the Right with Concomitant Dense Consolidation in the Lower lobes B/L compatible with PNA and Streptococcus Bacteremia, poA - ID following, please note multiple antibiotic allergies, for now continue Primaxin with decadron premedication  - SIRS criteria resolved. - Was hydrated with IV fluids. Currently appears volume overloaded as evidenced by clinical exam and chest x-ray findings. Will give Lasix x 1 12/1.  - sacral bone biopsy 11/22 with ESBL Klebsiella, Enterococcus faecalis, Streptococcus constellatus and mixed anaerobic flora present  Acute on chronic respiratory failure/tracheostomy dependent/acute on chronic diastolic CHF - 2-D echo 05/18/90 showed normal systolic function. - CXR this morning with fluid overload, Lasix x 1 and monitor response  HCAP with Recent Aspiration PNA - Chronic Tracheostomy - Aggressive pulmonary toilet.  Acute DVT in  R Subclavian Vein and R brachial vein (09/03/16). Likely related to PICC which was removed 11/22.  - PICC has been removed. Rt ij cvl  placed - No heparin drip, no bolus. Stopped 11/27 for low hgb/thrombocytopenia - May need a repeat Dopplers of RUE on 12/4 > if DVT is resolved as PICC has been removed, no need for heparin.   Hypoglycemia / hyperglycemia in the Setting of [place] Diabetes Mellitus Type 1 - Consider low-dose Lantus when patient resumes tube feeds and if CBGs remained consistently elevated. Currently without DKA.  - For now change insulin/CBGs to Q4h and see if it helps hyperglycemia  Acute encephalopathy - Apparently has been in intermittent and ongoing problem since hospital admission. CT head negative for acute findings. Neurology was consulted and have followed patient, felt to be related to his sepsis. Slowly resolved and now is alert   Hyponatremia >> hypernatremia - follow, currently appears clinically fluid overloaded on CXR. Gentle diuresis, closely monitor  Hypotension - continue Midodrine  Suspected Acute Lower GI Bleed and Blood Loss Anemia in the Setting of Steroid Use s/p Transfusion of 2 units of pRBC's - GI consulted, with resolution of blood per ostomy, that is no urgency to pursue colonoscopy via ostomy. Stable now  Leukocytosis in the setting of Infection, GI Bleed, and Steroid Use - monitor, 15 K today.   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 C6 Spinal Cord Infarct complicated by Neurogenic Bladder and Chronic Large Sacral Ulcer - suprapubic cath, diverting colostomy - on Pantoprazole, Baclofen, scopolamine, oxybutinin,   Leaking G Tube - IR Consulted and Replaced Gastric Bumper without complication  Hx of PE s/p IVC Filter - Not on Anticoagulation due to bleeding  Severe protein calorie malnutrition - Continue tube feeds   DVT prophylaxis: SCD Code Status: Full code Family Communication: d/w mother at bedside Disposition Plan: remain inpatient  Consultants:   ID  GI  Neurology  PCCM  IR   Plastic surgery   Procedures:   11/22 - sacral bone biopsy     Antimicrobials: Per ID linezolid 11/19>>> 11/24 tigecycline 11/21>>> 11/24 Vanc >>11/25 >> 11/26 Eraxis >>11/24 >> 11/26 Imipenem >>11/24 >>   MICRO:  BC x 2 11/23 >> Sacral bone 11/22 >> ESBL Kleb pna, Enterococcus faecalis, strep constellatus Respiratory culture 11/23 >> Klebsiella, Acinetobacter, ESBL  Subjective: - alert, complains of shortness of breath but otherwise feels "OK"  Objective: Vitals:   09/07/16 0800 09/07/16 0810 09/07/16 0812 09/07/16 0834  BP: 119/88  119/88   Pulse: 97  96   Resp: (!) 25  (!) 25   Temp:      TempSrc:      SpO2: 100% 100% 100% 97%  Weight:      Height:        Intake/Output Summary (Last 24 hours) at 09/07/16 1132 Last data filed at 09/07/16 0900  Gross per 24 hour  Intake          1283.17 ml  Output             3575 ml  Net         -2291.83 ml   Filed Weights   09/05/16 0501 09/06/16 0446 09/07/16 0500  Weight: 58.1 kg (128 lb) 53.5 kg (118 lb) 54 kg (119 lb)    Examination: Constitutional: ill appearing, trached, on vent Vitals:   09/07/16 0800 09/07/16 0810 09/07/16 0812 09/07/16 0834  BP: 119/88  119/88   Pulse: 97  96   Resp: (!) 25  (!)  25   Temp:      TempSrc:      SpO2: 100% 100% 100% 97%  Weight:      Height:       Eyes: PERRL ENMT: Mucous membranes are dry. Poor dentition Neck: normal, supple, trach in place Respiratory: coarse breath sounds bilaterally, no wheezing  Cardiovascular: Regular rate and rhythm, no murmurs / rubs / gallops. No LE edema.   Abdomen: no tenderness. Bowel sounds positive. Ostomy bag in place Skin: sacral decub, dressing in place, not evaluated today  Neurologic: UP contractures, paraplegic  Data Reviewed: I have personally reviewed following labs and imaging studies  CBC:  Recent Labs Lab 09/03/16 2300 09/04/16 0500 09/05/16 0500 09/06/16 0228 09/07/16 0342  WBC 26.4* 29.3* 21.1* 41.8* 15.0*  HGB 7.8* 7.7* 7.3* 8.2* 7.8*  HCT 23.4* 23.4* 23.1* 24.9* 23.5*  MCV 81.3  82.1 85.6 82.5 79.9  PLT 37* 38* 43* 56* 60*   Basic Metabolic Panel:  Recent Labs Lab 09/01/16 0400 09/02/16 0523 09/02/16 1214 09/03/16 0527 09/04/16 0500 09/05/16 0500 09/07/16 0342  NA 140 140 133* 141 144 144 148*  K 3.5 3.2* 3.6 3.6 3.8 3.3* 2.8*  CL 109 107 101 106 109 111 113*  CO2 _0 15* 10* 23  GLUCOSE 189* 104* 167* 100* 164* 290* 393*  BUN 32* 21* 22* 21* 22* 36* 22*  CREATININE 0.81 0.56* 0.60* 0.56* 1.11 1.44* 0.70  CALCIUM 7.6* 8.0* 7.6* 8.0* 7.9* 7.8* 7.8*  MG 1.4* 2.0  --  1.7 1.6* 1.5*  --   PHOS 3.1 3.3  --  3.7 4.6 5.9*  --    CBG:  Recent Labs Lab 09/06/16 1652 09/06/16 2058 09/07/16 0037 09/07/16 0425 09/07/16 0747  GLUCAP 348* 338* 352* 326* 401*   Urine analysis:    Component Value Date/Time   COLORURINE YELLOW 08/20/2016 1204   APPEARANCEUR CLOUDY (A) 08/30/2016 1204   LABSPEC 1.020 08/27/2016 1204   PHURINE 5.0 09/04/2016 1204   GLUCOSEU NEGATIVE 08/21/2016 1204   HGBUR LARGE (A) 08/18/2016 [place] 09/06/2016 1204   BILIRUBINUR neg 06/28/2014 [place] 08/11/2016 1204   PROTEINUR 30 (A) 08/14/2016 1204   UROBILINOGEN 0.2 07/27/2015 0645   NITRITE NEGATIVE 08/21/2016 1204   LEUKOCYTESUR MODERATE (A) 08/08/2016 1204   Sepsis Labs: Invalid input(s): PROCALCITONIN, LACTICIDVEN  Recent Results (from the past 240 hour(s))  Aerobic/Anaerobic Culture (surgical/deep wound)     Status: None   Collection Time: 08/08/2016  4:01 PM  Result Value Ref Range Status   Specimen Description TISSUE BONE  Final   Special Requests SACRAL  Final   Gram Stain   Final    ABUNDANT WBC PRESENT, PREDOMINANTLY PMN ABUNDANT GRAM NEGATIVE RODS ABUNDANT GRAM POSITIVE COCCI IN PAIRS FEW GRAM POSITIVE RODS    Culture   Final    MODERATE KLEBSIELLA PNEUMONIAE Confirmed Extended Spectrum Beta-Lactamase Producer (ESBL) MODERATE ENTEROCOCCUS FAECALIS MODERATE STREPTOCOCCUS CONSTELLATUS MIXED ANAEROBIC FLORA  PRESENT.  CALL LAB IF FURTHER IID REQUIRED.    Report Status 09/03/2016 FINAL  Final   Organism ID, Bacteria KLEBSIELLA PNEUMONIAE  Final   Organism ID, Bacteria ENTEROCOCCUS FAECALIS  Final      Susceptibility   Enterococcus faecalis - MIC*    AMPICILLIN <=2 SENSITIVE Sensitive     VANCOMYCIN 1 SENSITIVE Sensitive     GENTAMICIN SYNERGY SENSITIVE Sensitive     * MODERATE ENTEROCOCCUS FAECALIS   Klebsiella pneumoniae - MIC*    AMPICILLIN >=  32 RESISTANT Resistant     CEFAZOLIN >=64 RESISTANT Resistant     CEFEPIME >=64 RESISTANT Resistant     CEFTAZIDIME >=64 RESISTANT Resistant     CEFTRIAXONE >=64 RESISTANT Resistant     CIPROFLOXACIN 2 INTERMEDIATE Intermediate     GENTAMICIN >=16 RESISTANT Resistant     IMIPENEM <=0.25 SENSITIVE Sensitive     TRIMETH/SULFA >=320 RESISTANT Resistant     AMPICILLIN/SULBACTAM >=32 RESISTANT Resistant     PIP/TAZO >=128 RESISTANT Resistant     Extended ESBL POSITIVE Resistant     * MODERATE KLEBSIELLA PNEUMONIAE  Culture, blood (Routine X 2) w Reflex to ID Panel     Status: None   Collection Time: 08/30/16  9:30 AM  Result Value Ref Range Status   Specimen Description BLOOD LEFT ANTECUBITAL  Final   Special Requests BOTTLES DRAWN AEROBIC ONLY 5CC  Final   Culture NO GROWTH 5 DAYS  Final   Report Status 09/04/2016 FINAL  Final  Culture, blood (Routine X 2) w Reflex to ID Panel     Status: None   Collection Time: 08/30/16  9:35 AM  Result Value Ref Range Status   Specimen Description BLOOD BLOOD LEFT ARM  Final   Special Requests BOTTLES DRAWN AEROBIC ONLY 5CC  Final   Culture NO GROWTH 5 DAYS  Final   Report Status 09/04/2016 FINAL  Final  Culture, respiratory (NON-Expectorated)     Status: None   Collection Time: 08/30/16  3:56 PM  Result Value Ref Range Status   Specimen Description TRACHEAL ASPIRATE  Final   Special Requests NONE  Final   Gram Stain   Final    ABUNDANT WBC PRESENT,BOTH PMN AND MONONUCLEAR MODERATE GRAM POSITIVE COCCI IN  PAIRS FEW GRAM VARIABLE ROD FEW YEAST    Culture   Final    MODERATE KLEBSIELLA PNEUMONIAE MODERATE ACINETOBACTER CALCOACETICUS/BAUMANNII COMPLEX Confirmed Extended Spectrum Beta-Lactamase Producer (ESBL) for KLEBSIELLA PNEUMONIAE    Report Status 09/02/2016 FINAL  Final   Organism ID, Bacteria KLEBSIELLA PNEUMONIAE  Final   Organism ID, Bacteria ACINETOBACTER CALCOACETICUS/BAUMANNII COMPLEX  Final      Susceptibility   Acinetobacter calcoaceticus/baumannii complex - MIC*    CEFTAZIDIME >=64 RESISTANT Resistant     CEFTRIAXONE >=64 RESISTANT Resistant     CIPROFLOXACIN >=4 RESISTANT Resistant     GENTAMICIN 4 SENSITIVE Sensitive     IMIPENEM 1 SENSITIVE Sensitive     PIP/TAZO >=128 RESISTANT Resistant     TRIMETH/SULFA >=320 RESISTANT Resistant     CEFEPIME >=64 RESISTANT Resistant     AMPICILLIN/SULBACTAM 4 SENSITIVE Sensitive     * MODERATE ACINETOBACTER CALCOACETICUS/BAUMANNII COMPLEX   Klebsiella pneumoniae - MIC*    AMPICILLIN >=32 RESISTANT Resistant     CEFAZOLIN >=64 RESISTANT Resistant     CEFEPIME >=64 RESISTANT Resistant     CEFTAZIDIME 16 RESISTANT Resistant     CEFTRIAXONE >=64 RESISTANT Resistant     CIPROFLOXACIN 1 SENSITIVE Sensitive     GENTAMICIN >=16 RESISTANT Resistant     IMIPENEM <=0.25 SENSITIVE Sensitive     TRIMETH/SULFA >=320 RESISTANT Resistant     AMPICILLIN/SULBACTAM >=32 RESISTANT Resistant     PIP/TAZO 8 SENSITIVE Sensitive     Extended ESBL POSITIVE Resistant     * MODERATE KLEBSIELLA PNEUMONIAE  Fungus Culture With Stain     Status: None   Collection Time: 08/30/16  3:56 PM  Result Value Ref Range Status   Fungus Stain Final report  Final   Fungus (Mycology) Culture Preliminary report  Final    Comment: (NOTE) Performed At: [facility] [place], [place] [id number] Lindon Romp MD EX:[id number]    Fungal Source TRACHEAL ASPIRATE  Final  Fungus Culture Result     Status: None   Collection Time: 08/30/16  3:56  PM  Result Value Ref Range Status   Result 1 Yeast observed  Final    Comment: (NOTE) Performed At: [facility] [place], [place] [id number] Lindon Romp MD [id number]   Fungal organism reflex     Status: None   Collection Time: 08/30/16  3:56 PM  Result Value Ref Range Status   Fungal result 1 Candida albicans  Final    Comment: (NOTE) Light growth Performed At: [facility] [address] [place], [place] [id number] Lindon Romp MD RJ:[id number]   Culture, respiratory (NON-Expectorated)     Status: None   Collection Time: 08/31/16  1:20 AM  Result Value Ref Range Status   Specimen Description TRACHEAL ASPIRATE  Final   Special Requests NONE  Final   Gram Stain   Final    ABUNDANT WBC PRESENT,BOTH PMN AND MONONUCLEAR NO ORGANISMS SEEN    Culture   Final    MODERATE ACINETOBACTER CALCOACETICUS/BAUMANNII COMPLEX SUSCEPTIBILITIES PERFORMED ON PREVIOUS CULTURE WITHIN THE LAST 5 DAYS.    Report Status 09/04/2016 FINAL  Final  Culture, respiratory (NON-Expectorated)     Status: None (Preliminary result)   Collection Time: 09/06/16 10:36 PM  Result Value Ref Range Status   Specimen Description TRACHEAL ASPIRATE  Final   Special Requests Normal  Final   Gram Stain   Final    MODERATE WBC PRESENT,BOTH PMN AND MONONUCLEAR FEW GRAM NEGATIVE RODS    Culture PENDING  Incomplete   Report Status PENDING  Incomplete    Radiology Studies: Dg Chest Port 1 View  Result Date: 09/07/2016 CLINICAL DATA:  Acute onset of rhonchi.  Initial encounter. EXAM: PORTABLE CHEST 1 VIEW COMPARISON:  Chest radiograph performed 09/04/2016 FINDINGS: The patient's tracheostomy tube is seen ending 5-6 cm above the carina. A right IJ line is noted ending about the mid SVC. Vascular congestion is noted. Bilateral central airspace opacities may reflect pneumonia or pulmonary edema, more prominent than on the prior study. No definite pleural effusion or pneumothorax is  seen. The cardiomediastinal silhouette is borderline normal in size. No acute osseous abnormalities are identified. IMPRESSION: Vascular congestion. Bilateral central airspace opacities may reflect pulmonary edema or pneumonia, more prominent than on the prior study. Electronically Signed   By: Garald Balding M.D.   On: 09/07/2016 06:35   Scheduled Meds: . baclofen  10 mg Oral QID  . budesonide (PULMICORT) nebulizer solution  0.5 mg Nebulization BID  . chlorhexidine gluconate (MEDLINE KIT)  15 mL Mouth Rinse BID  . imipenem-cilastatin  500 mg Intravenous Q8H   And  . dexamethasone  4 mg Intravenous Q8H  . insulin aspart  0-9 Units Subcutaneous Q4H  . ipratropium  0.5 mg Nebulization BID  . mouth rinse  15 mL Mouth Rinse QID  . midodrine  20 mg Per Tube TID WC  . neomycin-bacitracin-polymyxin   Topical Daily  . oxybutynin  5 mg Per Tube BID  . pantoprazole (PROTONIX) IV  40 mg Intravenous Q24H  . potassium chloride  40 mEq Oral Q2H  . scopolamine  1 patch Transdermal Q72H   Continuous Infusions: . feeding supplement (GLUCERNA 1.2 CAL) 1,000 mL (09/07/16 0201)   Time spent: 35 minutes  [place]  Cruzita Lederer, MD, PhD [facility] Pager [phone]  If 7PM-7AM, please contact night-coverage [email or web address] Password [email or web address] 09/07/2016, 11:32 AM

## 2016-09-07 NOTE — Progress Notes (Signed)
Patient having abnormal breath sounds (narrowing sound & rhonchus). Patient suctioned & repositioned with some success in sounds. Advised via RT to inflate cuff by 1cc. Will continue to monitor patient closely.

## 2016-09-07 NOTE — Plan of Care (Signed)
Problem: Pain Managment: Goal: General experience of comfort will improve Outcome: Progressing Patient continues to ask for pain medication frequently- about every 2-3 hours.

## 2016-09-07 NOTE — Progress Notes (Signed)
MD notified of chest xray results & AM labs

## 2016-09-07 DEATH — deceased

## 2016-09-08 DIAGNOSIS — G8250 Quadriplegia, unspecified: Secondary | ICD-10-CM

## 2016-09-08 LAB — COMPREHENSIVE METABOLIC PANEL
ALBUMIN: 1.4 g/dL — AB (ref 3.5–5.0)
ALK PHOS: 171 U/L — AB (ref 38–126)
ALT: 7 U/L — ABNORMAL LOW (ref 17–63)
AST: 19 U/L (ref 15–41)
Anion gap: 6 (ref 5–15)
BILIRUBIN TOTAL: 0.7 mg/dL (ref 0.3–1.2)
BUN: 21 mg/dL — AB (ref 6–20)
CALCIUM: 8.2 mg/dL — AB (ref 8.9–10.3)
CO2: 27 mmol/L (ref 22–32)
Chloride: 115 mmol/L — ABNORMAL HIGH (ref 101–111)
Creatinine, Ser: 0.6 mg/dL — ABNORMAL LOW (ref 0.61–1.24)
GFR calc Af Amer: >60 mL/min (ref >60)
GFR calc non Af Amer: >60 mL/min (ref >60)
GLUCOSE: 192 mg/dL — AB (ref 65–99)
Potassium: 3.9 mmol/L (ref 3.5–5.1)
Sodium: 148 mmol/L — ABNORMAL HIGH (ref 135–145)
TOTAL PROTEIN: 4.8 g/dL — AB (ref 6.5–8.1)

## 2016-09-08 LAB — CBC
HEMATOCRIT: 23.8 % — AB (ref 39.0–52.0)
Hemoglobin: 7.8 g/dL — ABNORMAL LOW (ref 13.0–17.0)
MCH: 26.4 pg (ref 26.0–34.0)
MCHC: 32.8 g/dL (ref 30.0–36.0)
MCV: 80.7 fL (ref 78.0–100.0)
Platelets: 102 10*3/uL — ABNORMAL LOW (ref 150–400)
RBC: 2.95 MIL/uL — AB (ref 4.22–5.81)
RDW: 17.2 % — AB (ref 11.5–15.5)
WBC: 16.3 10*3/uL — AB (ref 4.0–10.5)

## 2016-09-08 LAB — GLUCOSE, CAPILLARY
GLUCOSE-CAPILLARY: 193 mg/dL — AB (ref 65–99)
GLUCOSE-CAPILLARY: 110 mg/dL — AB (ref 65–99)
GLUCOSE-CAPILLARY: 179 mg/dL — AB (ref 65–99)
Glucose-Capillary: 214 mg/dL — ABNORMAL HIGH (ref 65–99)
Glucose-Capillary: 252 mg/dL — ABNORMAL HIGH (ref 65–99)
Glucose-Capillary: 277 mg/dL — ABNORMAL HIGH (ref 65–99)

## 2016-09-08 LAB — MAGNESIUM: Magnesium: 1.4 mg/dL — ABNORMAL LOW (ref 1.7–2.4)

## 2016-09-08 MED ORDER — ACETAMINOPHEN 160 MG/5ML PO SOLN
650.0000 mg | Freq: Four times a day (QID) | ORAL | Status: DC | PRN
  Administered 2016-09-08 – 2016-09-17 (×6): 650 mg
  Filled 2016-09-08 (×6): qty 20.3

## 2016-09-08 MED ORDER — INSULIN ASPART 100 UNIT/ML ~~LOC~~ SOLN
0.0000 [IU] | Freq: Four times a day (QID) | SUBCUTANEOUS | Status: DC
Start: 2016-09-09 — End: 2016-09-11
  Administered 2016-09-09 (×2): 8 [IU] via SUBCUTANEOUS
  Administered 2016-09-09: 15 [IU] via SUBCUTANEOUS
  Administered 2016-09-09: 2 [IU] via SUBCUTANEOUS
  Administered 2016-09-10: 15 [IU] via SUBCUTANEOUS
  Administered 2016-09-10: 5 [IU] via SUBCUTANEOUS
  Administered 2016-09-10: 15 [IU] via SUBCUTANEOUS
  Administered 2016-09-10: 3 [IU] via SUBCUTANEOUS

## 2016-09-08 MED ORDER — SODIUM CHLORIDE 0.9 % IV SOLN
1000.0000 mg | Freq: Three times a day (TID) | INTRAVENOUS | Status: DC
  Administered 2016-09-09 – 2016-09-10 (×5): 1000 mg via INTRAVENOUS
  Filled 2016-09-08 (×10): qty 1000

## 2016-09-08 MED ORDER — INSULIN ASPART 100 UNIT/ML ~~LOC~~ SOLN: 0.0000 [IU] | Freq: Four times a day (QID) | SUBCUTANEOUS | Status: DC

## 2016-09-08 MED ORDER — DEXAMETHASONE SODIUM PHOSPHATE 4 MG/ML IJ SOLN
4.0000 mg | Freq: Three times a day (TID) | INTRAMUSCULAR | Status: DC
  Administered 2016-09-08 – 2016-09-10 (×6): 4 mg via INTRAVENOUS
  Filled 2016-09-08 (×7): qty 1

## 2016-09-08 NOTE — Progress Notes (Signed)
INFECTIOUS DISEASE PROGRESS NOTE  ID: Jason [name] is a 30 y.o. 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 ([place])   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([place])   History of pulmonary embolism   DVT (deep venous thrombosis) ([name])   Severe protein-calorie malnutrition ([place])   Dysphagia, pharyngoesophageal phase   Decubitus ulcer of right perineal ischial region, stage 4 ([place])   Decubitus ulcer of left perineal ischial region, stage 4 ([place])   Sacral decubitus ulcer, stage IV ([name])   Multiple allergies   Recurrent UTI   Respiratory failure, chronic ([place])   Aspiration pneumonia of right lower lobe ([name])   Seizures ([name])   Anxiety state   Anemia of chronic disease   Hyponatremia   Encephalopathy   Other acute osteomyelitis, multiple sites ([place])   Lower GI bleed   Viridans streptococci infection   Coma of unknown cause ([place])   Septic shock ([name])   Anaphylactic syndrome   Acute hypoxemic respiratory failure ([name])   Enterococcal infection   Thrombocytopenia ([name])  Subjective: Fever this AM  Abtx:  Anti-infectives    Start     Dose/Rate Route Frequency Ordered Stop   09/01/16 1300  anidulafungin (ERAXIS) 100 mg in sodium chloride 0.9 % 100 mL IVPB  Status:  Discontinued     100 mg over 90 Minutes Intravenous Every 24 hours 08/31/16 1240 09/02/16 1252   09/01/16 1200  vancomycin (VANCOCIN) IVPB 750 mg/150 ml premix  Status:  Discontinued     750 mg 150 mL/hr over 60 Minutes Intravenous Every 24 hours 09/01/16 1145 09/01/16 1712   08/31/16 1700  imipenem-cilastatin (PRIMAXIN) 500 mg in sodium chloride 0.9 % 100 mL IVPB     500 mg 200 mL/hr over 30 Minutes Intravenous Every 8 hours 08/31/16 1240     08/31/16 1300  anidulafungin (ERAXIS) 200 mg in sodium chloride 0.9 % 200 mL IVPB     200 mg over 180 Minutes Intravenous  Once 08/31/16  1240 08/31/16 1810   08/09/2016 0557  tigecycline (TYGACIL) 50 mg in sodium chloride 0.9 % 100 mL IVPB  Status:  Discontinued     50 mg 200 mL/hr over 30 Minutes Intravenous Every 12 hours 08/28/16 1758 08/31/16 1151   08/28/16 1800  tigecycline (TYGACIL) 100 mg in sodium chloride 0.9 % 100 mL IVPB  Status:  Discontinued     100 mg 200 mL/hr over 30 Minutes Intravenous  Once 08/28/16 1758 08/31/16 1424   08/28/16 1300  linezolid (ZYVOX) IVPB 600 mg  Status:  Discontinued     600 mg 300 mL/hr over 60 Minutes Intravenous Every 12 hours 08/28/16 1239 08/31/16 2014   08/28/16 0200  aztreonam (AZACTAM) 1 g in dextrose 5 % 50 mL IVPB  Status:  Discontinued     1 g 100 mL/hr over 30 Minutes Intravenous Every 8 hours 08/27/16 1802 08/28/16 1232   08/27/16 2000  vancomycin (VANCOCIN) 500 mg in sodium chloride 0.9 % 100 mL IVPB  Status:  Discontinued     500 mg 100 mL/hr over 60 Minutes Intravenous Every 12 hours 08/27/16 1802 08/28/16 1239   08/27/16 1800  aztreonam (AZACTAM) 2 g in dextrose 5 % 50 mL IVPB     2 g 100 mL/hr over 30 Minutes Intravenous  Once 08/27/16 1634 08/27/16 1852   08/27/16 1600  levofloxacin (LEVAQUIN) IVPB 750 mg  Status:  Discontinued     750 mg 100 mL/hr over 90 Minutes Intravenous Every 24 hours 08/15/2016 1324 08/28/2016 1522   08/27/16 0000  vancomycin (VANCOCIN) 500 mg in sodium chloride 0.9 % 100 mL IVPB  Status:  Discontinued     500 mg 100 mL/hr over 60 Minutes Intravenous Every 12 hours 08/16/2016 1324 09/01/2016 1522   09/06/2016 2200  aztreonam (AZACTAM) 1 g in dextrose 5 % 50 mL IVPB  Status:  Discontinued     1 g 100 mL/hr over 30 Minutes Intravenous Every 8 hours 08/22/2016 1324 08/24/2016 1522   08/08/2016 1600  linezolid (ZYVOX) tablet 600 mg  Status:  Discontinued     600 mg Oral Every 12 hours 08/30/2016 1458 08/27/16 1347   08/22/2016 1300  levofloxacin (LEVAQUIN) IVPB 750 mg  Status:  Discontinued     750 mg 100 mL/hr over 90 Minutes Intravenous  Once 08/23/2016 1246  09/03/2016 1522   08/10/2016 1300  aztreonam (AZACTAM) 2 g in dextrose 5 % 50 mL IVPB  Status:  Discontinued     2 g 100 mL/hr over 30 Minutes Intravenous  Once 08/22/2016 1246 09/01/2016 1522   08/25/2016 1300  vancomycin (VANCOCIN) IVPB 1000 mg/200 mL premix  Status:  Discontinued     1,000 mg 200 mL/hr over 60 Minutes Intravenous  Once 08/11/2016 1246 08/23/2016 1522      Medications:  Scheduled: . baclofen  10 mg Oral QID  . budesonide (PULMICORT) nebulizer solution  0.5 mg Nebulization BID  . chlorhexidine gluconate (MEDLINE KIT)  15 mL Mouth Rinse BID  . imipenem-cilastatin  500 mg Intravenous Q8H   And  . dexamethasone  4 mg Intravenous Q8H  . insulin aspart  0-15 Units Subcutaneous Q6H  . insulin glargine  12 Units Subcutaneous QHS  . ipratropium  0.5 mg Nebulization BID  . mouth rinse  15 mL Mouth Rinse QID  . midodrine  20 mg Per Tube TID WC  . neomycin-bacitracin-polymyxin   Topical Daily  . oxybutynin  5 mg Per Tube BID  . pantoprazole (PROTONIX) IV  40 mg Intravenous Q24H  . scopolamine  1 patch Transdermal Q72H    Objective: Vital signs in last 24 hours: Temp:  [98.6 F (37 C)-101.3 F (38.5 C)] 98.6 F (37 C) (12/02 0759) Pulse Rate:  [84-103] 90 (12/02 0759) Resp:  [20-32] 25 (12/02 0759) BP: (92-142)/(56-100) 142/100 (12/02 0759) SpO2:  [97 %-100 %] 100 % (12/02 0759) FiO2 (%):  [40 %] 40 % (12/02 0759) Weight:  [49.9 kg (110 lb)] 49.9 kg (110 lb) (12/02 0500)   General appearance: alert, cooperative and no distress Neck: R IJ clean.  Resp: clear to auscultation bilaterally Cardio: regular rate and rhythm GI: normal findings: bowel sounds normal and soft, non-tender  Lab Results  Recent Labs  09/07/16 0342 09/08/16 0653  WBC 15.0* 16.3*  HGB 7.8* 7.8*  HCT 23.5* 23.8*  NA 148* 148*  K 2.8* 3.9  CL 113* 115*  CO2 23 27  BUN 22* 21*  CREATININE 0.70 0.60*   Liver Panel  Recent Labs  09/08/16 0653  PROT 4.8*  ALBUMIN 1.4*  AST 19  ALT 7*    ALKPHOS 171*  BILITOT 0.7   Sedimentation Rate No results for input(s): ESRSEDRATE in the last 72 hours. C-Reactive Protein No results for input(s): CRP in the last 72 hours.  Microbiology: Recent Results (from the past 240 hour(s))  Aerobic/Anaerobic Culture (surgical/deep wound)  Status: None   Collection Time: 08/22/2016  4:01 PM  Result Value Ref Range Status   Specimen Description TISSUE BONE  Final   Special Requests SACRAL  Final   Gram Stain   Final    ABUNDANT WBC PRESENT, PREDOMINANTLY PMN ABUNDANT GRAM NEGATIVE RODS ABUNDANT GRAM POSITIVE COCCI IN PAIRS FEW GRAM POSITIVE RODS    Culture   Final    MODERATE KLEBSIELLA PNEUMONIAE Confirmed Extended Spectrum Beta-Lactamase Producer (ESBL) MODERATE ENTEROCOCCUS FAECALIS MODERATE STREPTOCOCCUS CONSTELLATUS MIXED ANAEROBIC FLORA PRESENT.  CALL LAB IF FURTHER IID REQUIRED.    Report Status 09/03/2016 FINAL  Final   Organism ID, Bacteria KLEBSIELLA PNEUMONIAE  Final   Organism ID, Bacteria ENTEROCOCCUS FAECALIS  Final      Susceptibility   Enterococcus faecalis - MIC*    AMPICILLIN <=2 SENSITIVE Sensitive     VANCOMYCIN 1 SENSITIVE Sensitive     GENTAMICIN SYNERGY SENSITIVE Sensitive     * MODERATE ENTEROCOCCUS FAECALIS   Klebsiella pneumoniae - MIC*    AMPICILLIN >=32 RESISTANT Resistant     CEFAZOLIN >=64 RESISTANT Resistant     CEFEPIME >=64 RESISTANT Resistant     CEFTAZIDIME >=64 RESISTANT Resistant     CEFTRIAXONE >=64 RESISTANT Resistant     CIPROFLOXACIN 2 INTERMEDIATE Intermediate     GENTAMICIN >=16 RESISTANT Resistant     IMIPENEM <=0.25 SENSITIVE Sensitive     TRIMETH/SULFA >=320 RESISTANT Resistant     AMPICILLIN/SULBACTAM >=32 RESISTANT Resistant     PIP/TAZO >=128 RESISTANT Resistant     Extended ESBL POSITIVE Resistant     * MODERATE KLEBSIELLA PNEUMONIAE  Culture, blood (Routine X 2) w Reflex to ID Panel     Status: None   Collection Time: 08/30/16  9:30 AM  Result Value Ref Range Status    Specimen Description BLOOD LEFT ANTECUBITAL  Final   Special Requests BOTTLES DRAWN AEROBIC ONLY 5CC  Final   Culture NO GROWTH 5 DAYS  Final   Report Status 09/04/2016 FINAL  Final  Culture, blood (Routine X 2) w Reflex to ID Panel     Status: None   Collection Time: 08/30/16  9:35 AM  Result Value Ref Range Status   Specimen Description BLOOD BLOOD LEFT ARM  Final   Special Requests BOTTLES DRAWN AEROBIC ONLY 5CC  Final   Culture NO GROWTH 5 DAYS  Final   Report Status 09/04/2016 FINAL  Final  Culture, respiratory (NON-Expectorated)     Status: None   Collection Time: 08/30/16  3:56 PM  Result Value Ref Range Status   Specimen Description TRACHEAL ASPIRATE  Final   Special Requests NONE  Final   Gram Stain   Final    ABUNDANT WBC PRESENT,BOTH PMN AND MONONUCLEAR MODERATE GRAM POSITIVE COCCI IN PAIRS FEW GRAM VARIABLE ROD FEW YEAST    Culture   Final    MODERATE KLEBSIELLA PNEUMONIAE MODERATE ACINETOBACTER CALCOACETICUS/BAUMANNII COMPLEX Confirmed Extended Spectrum Beta-Lactamase Producer (ESBL) for KLEBSIELLA PNEUMONIAE    Report Status 09/02/2016 FINAL  Final   Organism ID, Bacteria KLEBSIELLA PNEUMONIAE  Final   Organism ID, Bacteria ACINETOBACTER CALCOACETICUS/BAUMANNII COMPLEX  Final      Susceptibility   Acinetobacter calcoaceticus/baumannii complex - MIC*    CEFTAZIDIME >=64 RESISTANT Resistant     CEFTRIAXONE >=64 RESISTANT Resistant     CIPROFLOXACIN >=4 RESISTANT Resistant     GENTAMICIN 4 SENSITIVE Sensitive     IMIPENEM 1 SENSITIVE Sensitive     PIP/TAZO >=128 RESISTANT Resistant     TRIMETH/SULFA >=320  RESISTANT Resistant     CEFEPIME >=64 RESISTANT Resistant     AMPICILLIN/SULBACTAM 4 SENSITIVE Sensitive     * MODERATE ACINETOBACTER CALCOACETICUS/BAUMANNII COMPLEX   Klebsiella pneumoniae - MIC*    AMPICILLIN >=32 RESISTANT Resistant     CEFAZOLIN >=64 RESISTANT Resistant     CEFEPIME >=64 RESISTANT Resistant     CEFTAZIDIME 16 RESISTANT Resistant      CEFTRIAXONE >=64 RESISTANT Resistant     CIPROFLOXACIN 1 SENSITIVE Sensitive     GENTAMICIN >=16 RESISTANT Resistant     IMIPENEM <=0.25 SENSITIVE Sensitive     TRIMETH/SULFA >=320 RESISTANT Resistant     AMPICILLIN/SULBACTAM >=32 RESISTANT Resistant     PIP/TAZO 8 SENSITIVE Sensitive     Extended ESBL POSITIVE Resistant     * MODERATE KLEBSIELLA PNEUMONIAE  Fungus Culture With Stain     Status: None   Collection Time: 08/30/16  3:56 PM  Result Value Ref Range Status   Fungus Stain Final report  Final   Fungus (Mycology) Culture Preliminary report  Final    Comment: (NOTE) Performed At: [facility] [place], [place] [id number] Lindon Romp MD VH:[id number]    Fungal Source TRACHEAL ASPIRATE  Final  Fungus Culture Result     Status: None   Collection Time: 08/30/16  3:56 PM  Result Value Ref Range Status   Result 1 Yeast observed  Final    Comment: (NOTE) Performed At: [facility] [place], [place] [id number] Lindon Romp MD UV:[id number]   Fungal organism reflex     Status: None   Collection Time: 08/30/16  3:56 PM  Result Value Ref Range Status   Fungal result 1 Candida albicans  Final    Comment: (NOTE) Light growth Performed At: [facility] [address] [place], [place] [id number] Lindon Romp MD VF:[id number]   Culture, respiratory (NON-Expectorated)     Status: None   Collection Time: 08/31/16  1:20 AM  Result Value Ref Range Status   Specimen Description TRACHEAL ASPIRATE  Final   Special Requests NONE  Final   Gram Stain   Final    ABUNDANT WBC PRESENT,BOTH PMN AND MONONUCLEAR NO ORGANISMS SEEN    Culture   Final    MODERATE ACINETOBACTER CALCOACETICUS/BAUMANNII COMPLEX SUSCEPTIBILITIES PERFORMED ON PREVIOUS CULTURE WITHIN THE LAST 5 DAYS.    Report Status 09/04/2016 FINAL  Final  Culture, respiratory (NON-Expectorated)     Status: None (Preliminary result)   Collection Time: 09/06/16  10:36 PM  Result Value Ref Range Status   Specimen Description TRACHEAL ASPIRATE  Final   Special Requests Normal  Final   Gram Stain   Final    MODERATE WBC PRESENT,BOTH PMN AND MONONUCLEAR FEW GRAM NEGATIVE RODS    Culture ABUNDANT GRAM NEGATIVE RODS  Final   Report Status PENDING  Incomplete    Studies/Results: Dg Chest Port 1 View  Result Date: 09/07/2016 CLINICAL DATA:  Acute onset of rhonchi.  Initial encounter. EXAM: PORTABLE CHEST 1 VIEW COMPARISON:  Chest radiograph performed 09/04/2016 FINDINGS: The patient's tracheostomy tube is seen ending 5-6 cm above the carina. A right IJ line is noted ending about the mid SVC. Vascular congestion is noted. Bilateral central airspace opacities may reflect pneumonia or pulmonary edema, more prominent than on the prior study. No definite pleural effusion or pneumothorax is seen. The cardiomediastinal silhouette is borderline normal in size. No acute osseous abnormalities are identified. IMPRESSION: Vascular congestion. Bilateral central airspace opacities may reflect pulmonary  edema or pneumonia, more prominent than on the prior study. Electronically Signed   By: Garald Balding M.D.   On: 09/07/2016 06:35     Assessment/Plan: C6 Spinal Infarct Quadraplegia Sepsis             Streptococcal constellatus bacteremia Sacral Osteomyelitis Bone Bx 11-22             ESBL klebsiella             Enterococcus (S- amp)             Strep constellatus        HCAP, aspiration Chronic Trach             CXR worsening vascular congestion.              Acinetobacter             Klebsiella  Total days of antibiotics: 13 Imipenem day 9  WBC stable Mom, pt and RN all attribute his fever to bed malfunction (too hot).  Has been given steroids as pre-med for anbx due to multiple allergies.  His leukocytosis is most likely related to this.  Will need long term anbx for sacral osteo (6 weeks)          [name] Infectious Diseases (pager)  [phone] [email or web address] 09/08/2016, 11:16 AM  LOS: 13 days

## 2016-09-08 NOTE — Progress Notes (Signed)
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 In addition, the patient has been increasingly lethargic, with decreasedurine output over the 3 days PTA with concerns for sepsis from his sacral decubitus ulcer. Mother reports that the patient had large volume of watery diarrhea.  He was admitted to SDU and Infectious Disease, Gastroenterology, and IR was consulted for further evaluation and management. His ostomy bleeding has stopped immediately after admission and with stable Hb GI held off colonoscopy and apparently has been stable since. Patient has had progressive lethargy and PCCM was consulted 11/21. Plastic surgery was consulted 11/21 and patient was eventually taken to OR on 11/22 for sacral bone biopsy. Patient's mental status continued to deteriorate and was transferred to the ICU on 11/23. Neurology was consulted and felt like his encephalopathy is related to sepsis. With supportive treatment, antibiotics and vent support he continued to improve and TRH took over 12/1.  Assessment & Plan:  # Sepsis due to streptococcal constellatus bacteremia, acute osteomyelitis of spine, pneumonia: -Patient is allergic to multiple antibiotics. Currently on Primaxin. Medication with Decadron. ID and pharmacist consult appreciated. Patient had fever this morning which patient's mother attribute to hot bed. Follow-up final culture results. -Follow-up ID for final antibiotics planned. -sacral bone biopsy 11/22 with ESBL Klebsiella, Enterococcus faecalis, Streptococcus  constellatus and mixed anaerobic flora present  # Acute on chronic respiratory failure/tracheostomy dependent/acute on chronic diastolic CHF - 2-D echo 6/33/35 showed normal systolic function. - On ventilator, stable respiratory status.  #HCAP with Recent Aspiration PNA - On antibiotics as above, has tracheostomy  -continue bronchodilators and respiratory therapy.  # Acute DVT in R Subclavian Vein and R brachial vein (09/03/16). Likely related to PICC which was removed 11/22.  - PICC has been removed. Rt ij cvl placed - Noheparin drip, no bolus. Stopped 11/27 for low hgb/thrombocytopenia - May need a repeat Dopplers of RUE on 12/4 >if DVT is resolved as PICC has been removed, no need for heparin.   # Hypoglycemia / hyperglycemia in the Setting of Brittle Diabetes Mellitus Type 1 - Brittle diabetic interval. Now blood sugar acceptable. Continue current insulin regimen. Patient is on tube feed.  # Acute encephalopathy - Likely metabolic encephalopathy. Continue to monitor status.  # hypernatremia - Monitor BMP. Free water with 2 feet.  # Hypotension - continue Midodrine  #Suspected Acute Lower GI Bleed and Blood Loss Anemia in the Setting of Steroid Use s/p Transfusion of 2 units of pRBC's - GI consulted -stable now  #Leukocytosis in the setting of Infection, GI Bleed, and Steroid Use - monitor, 15 K today.   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 Follow-up dietary comparison.    Active Problems:   Gastroparesis due to DM [facility])   Altered mental status   Neurogenic  bladder   Suprapubic catheter ([name])   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 ([place])   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([place])   History of pulmonary embolism   DVT (deep venous thrombosis) ([name])   Severe protein-calorie malnutrition ([place])   Dysphagia, pharyngoesophageal phase   Decubitus ulcer of right perineal ischial region, stage 4 ([name])   Decubitus ulcer of left perineal ischial region, stage 4 ([name])   Sacral decubitus ulcer, stage IV ([name])   Multiple allergies   Recurrent UTI   Respiratory failure, chronic ([name])   Aspiration pneumonia of right lower lobe ([name])   Seizures ([name])   Anxiety state   Anemia of chronic disease   Hyponatremia   Encephalopathy   Other acute osteomyelitis, multiple sites ([place])   Lower GI bleed   Viridans streptococci infection   Coma of unknown cause ([name])   Septic shock ([name])   Anaphylactic syndrome   Acute hypoxemic respiratory failure ([name])   Enterococcal infection   Thrombocytopenia ([name])  DVT prophylaxis: SCD. Patient has thrombocytopenia and increased bleeding risk. Code Status: Full code Family Communication: [name] at bedside Disposition Plan: Likely discharged to SNF in 2-3 days.  Consultants:   Infectious diseases, GI, neurology, critical care, intravenous to radiology and plastic surgery.  Procedures: Sacral bone biopsy on November 22.  Antimicrobials this admission:  Vancomycin 11/20 >>11/21, 11/25 Aztreonam 11/20 >>11/21 (allergy added, swelling of neck 11/21) Tigecycline 11/22 >> 11/24 Linezolid 11/19 >> 11/20; 11/21 >> 11/24 Eraxis 11/24 >>11/26  Imipenem w/ premed 11/24 >>  Microbiology results:  11/19 BCx x 3: Viridans strep - S ceftriaxone, eryth, levo, PCN, vanc 11/19UCx (suprapub cath): >100k col yeast; >100k col E.faecalis; 20k col acinetobacter calcoaceticus/baumannii (known to be colonized per ID) 11/20 MRSA PCR: positive 11/22 Sacral tissue culture: Kleb Pneumo (S-Imipenem) and E faecalis  (S-ampicillin)  11/23 BCx: ngF 11/23 TA: Kleb Pneumo (S-cipro, imipenem) and acinetobacter (S-Unasyn, imipenem, gentamicin) 11/24 TA: Acinetobacter calcoaceticus/baumannii complex 11/30 TA >> Abundant PSA (pending susceptibilities)  Subjective: Patient was seen and examined at bedside. Patient is alert awake and responding to simple question. Denied pain, shortness of breath, nausea vomiting or headache. He is on trach and ventilator. [name] at bedside.   Objective: Vitals:   09/08/16 0600 09/08/16 0757 09/08/16 0759 09/08/16 1221  BP: 95/78  (!) 142/100 (!) 108/91  Pulse: 94  90 89  Resp: (!) 25  (!) 25 (!) 26  Temp:   98.6 F (37 C) 98.5 F (36.9 C)  TempSrc:   Oral Oral  SpO2: 100% 100% 100% 100%  Weight:      Height:        Intake/Output Summary (Last 24 hours) at 09/08/16 1347 Last data filed at 09/08/16 1247  Gross per 24 hour  Intake             1492 ml  Output             2450 ml  Net             -958 ml   Filed Weights   09/06/16 0446 09/07/16 0500 09/08/16 0500  Weight: 53.5 kg (118 lb) 54 kg (119 lb) 49.9 kg (110 lb)    Examination:  General exam:Ill looking male lying in bed with trach and ventilator. Respiratory system: Coarse breath sound bilaterally, respiratory effort normal Cardiovascular system: S1 & S2 heard, RRR.  No pedal edema. Gastrointestinal system: Abdomen is soft and nontender. Normal bowel sounds heard. Ostomy bag in place  Central nervous system: Alert awake and following simple commands. Skin: Sacral decubitus with dressing on, did not evaluate today GU: Suprapubic catheter in place.    Data Reviewed: I have personally reviewed following labs and imaging studies  CBC:  Recent Labs Lab 09/04/16 0500 09/05/16 0500 09/06/16 0228 09/07/16 0342 09/08/16 0653  WBC 29.3* 21.1* 41.8* 15.0* 16.3*  HGB 7.7* 7.3* 8.2* 7.8* 7.8*  HCT 23.4* 23.1* 24.9* 23.5* 23.8*  MCV 82.1 85.6 82.5 79.9 80.7  PLT 38* 43* 56* 60* 102*   Basic  Metabolic Panel:  Recent Labs Lab 09/02/16 0523  09/03/16 0527 09/04/16 0500 09/05/16 0500 09/07/16 0342 09/08/16 0653  NA 140  < > 141 144 144 148* 148*  K 3.2*  < > 3.6 3.8 3.3* 2.8* 3.9  CL 107  < > 106 109 111 113* 115*  CO2 25  < > 24 15* 10* 23 27  GLUCOSE 104*  < > 100* 164* 290* 393* 192*  BUN 21*  < > 21* 22* 36* 22* 21*  CREATININE 0.56*  < > 0.56* 1.11 1.44* 0.70 0.60*  CALCIUM 8.0*  < > 8.0* 7.9* 7.8* 7.8* 8.2*  MG 2.0  --  1.7 1.6* 1.5*  --  1.4*  PHOS 3.3  --  3.7 4.6 5.9*  --   --   < > = values in this interval not displayed. GFR: Estimated Creatinine Clearance: 93.6 mL/min (by C-G formula based on SCr of 0.6 mg/dL (L)). Liver Function Tests:  Recent Labs Lab 09/08/16 0653  AST 19  ALT 7*  ALKPHOS 171*  BILITOT 0.7  PROT 4.8*  ALBUMIN 1.4*   No results for input(s): LIPASE, AMYLASE in the last 168 hours. No results for input(s): AMMONIA in the last 168 hours. Coagulation Profile: No results for input(s): INR, PROTIME in the last 168 hours. Cardiac Enzymes: No results for input(s): CKTOTAL, CKMB, CKMBINDEX, TROPONINI in the last 168 hours. BNP (last 3 results) No results for input(s): PROBNP in the last 8760 hours. HbA1C: No results for input(s): HGBA1C in the last 72 hours. CBG:  Recent Labs Lab 09/07/16 2021 09/08/16 0033 09/08/16 0427 09/08/16 0655 09/08/16 1219  GLUCAP 386* 252* 214* 193* 110*   Lipid Profile: No results for input(s): CHOL, HDL, LDLCALC, TRIG, CHOLHDL, LDLDIRECT in the last 72 hours. Thyroid Function Tests: No results for input(s): TSH, T4TOTAL, FREET4, T3FREE, THYROIDAB in the last 72 hours. Anemia Panel: No results for input(s): VITAMINB12, FOLATE, FERRITIN, TIBC, IRON, RETICCTPCT in the last 72 hours. Sepsis Labs: No results for input(s): PROCALCITON, LATICACIDVEN in the last 168 hours.  Recent Results (from the past 240 hour(s))  Aerobic/Anaerobic Culture (surgical/deep wound)     Status: None   Collection  Time: 09/02/2016  4:01 PM  Result Value Ref Range Status   Specimen Description TISSUE BONE  Final   Special Requests SACRAL  Final   Gram Stain   Final    ABUNDANT WBC PRESENT, PREDOMINANTLY PMN ABUNDANT GRAM NEGATIVE RODS ABUNDANT GRAM POSITIVE COCCI IN PAIRS FEW GRAM POSITIVE RODS    Culture   Final    MODERATE KLEBSIELLA PNEUMONIAE Confirmed Extended Spectrum Beta-Lactamase Producer (ESBL) MODERATE ENTEROCOCCUS FAECALIS MODERATE STREPTOCOCCUS CONSTELLATUS MIXED ANAEROBIC FLORA PRESENT.  CALL LAB IF FURTHER IID REQUIRED.    Report Status 09/03/2016 FINAL  Final   Organism ID, Bacteria KLEBSIELLA PNEUMONIAE  Final   Organism ID, Bacteria ENTEROCOCCUS FAECALIS  Final      Susceptibility   Enterococcus faecalis - MIC*  AMPICILLIN <=2 SENSITIVE Sensitive     VANCOMYCIN 1 SENSITIVE Sensitive     GENTAMICIN SYNERGY SENSITIVE Sensitive     * MODERATE ENTEROCOCCUS FAECALIS   Klebsiella pneumoniae - MIC*    AMPICILLIN >=32 RESISTANT Resistant     CEFAZOLIN >=64 RESISTANT Resistant     CEFEPIME >=64 RESISTANT Resistant     CEFTAZIDIME >=64 RESISTANT Resistant     CEFTRIAXONE >=64 RESISTANT Resistant     CIPROFLOXACIN 2 INTERMEDIATE Intermediate     GENTAMICIN >=16 RESISTANT Resistant     IMIPENEM <=0.25 SENSITIVE Sensitive     TRIMETH/SULFA >=320 RESISTANT Resistant     AMPICILLIN/SULBACTAM >=32 RESISTANT Resistant     PIP/TAZO >=128 RESISTANT Resistant     Extended ESBL POSITIVE Resistant     * MODERATE KLEBSIELLA PNEUMONIAE  Culture, blood (Routine X 2) w Reflex to ID Panel     Status: None   Collection Time: 08/30/16  9:30 AM  Result Value Ref Range Status   Specimen Description BLOOD LEFT ANTECUBITAL  Final   Special Requests BOTTLES DRAWN AEROBIC ONLY 5CC  Final   Culture NO GROWTH 5 DAYS  Final   Report Status 09/04/2016 FINAL  Final  Culture, blood (Routine X 2) w Reflex to ID Panel     Status: None   Collection Time: 08/30/16  9:35 AM  Result Value Ref Range Status     Specimen Description BLOOD BLOOD LEFT ARM  Final   Special Requests BOTTLES DRAWN AEROBIC ONLY 5CC  Final   Culture NO GROWTH 5 DAYS  Final   Report Status 09/04/2016 FINAL  Final  Culture, respiratory (NON-Expectorated)     Status: None   Collection Time: 08/30/16  3:56 PM  Result Value Ref Range Status   Specimen Description TRACHEAL ASPIRATE  Final   Special Requests NONE  Final   Gram Stain   Final    ABUNDANT WBC PRESENT,BOTH PMN AND MONONUCLEAR MODERATE GRAM POSITIVE COCCI IN PAIRS FEW GRAM VARIABLE ROD FEW YEAST    Culture   Final    MODERATE KLEBSIELLA PNEUMONIAE MODERATE ACINETOBACTER CALCOACETICUS/BAUMANNII COMPLEX Confirmed Extended Spectrum Beta-Lactamase Producer (ESBL) for KLEBSIELLA PNEUMONIAE    Report Status 09/02/2016 FINAL  Final   Organism ID, Bacteria KLEBSIELLA PNEUMONIAE  Final   Organism ID, Bacteria ACINETOBACTER CALCOACETICUS/BAUMANNII COMPLEX  Final      Susceptibility   Acinetobacter calcoaceticus/baumannii complex - MIC*    CEFTAZIDIME >=64 RESISTANT Resistant     CEFTRIAXONE >=64 RESISTANT Resistant     CIPROFLOXACIN >=4 RESISTANT Resistant     GENTAMICIN 4 SENSITIVE Sensitive     IMIPENEM 1 SENSITIVE Sensitive     PIP/TAZO >=128 RESISTANT Resistant     TRIMETH/SULFA >=320 RESISTANT Resistant     CEFEPIME >=64 RESISTANT Resistant     AMPICILLIN/SULBACTAM 4 SENSITIVE Sensitive     * MODERATE ACINETOBACTER CALCOACETICUS/BAUMANNII COMPLEX   Klebsiella pneumoniae - MIC*    AMPICILLIN >=32 RESISTANT Resistant     CEFAZOLIN >=64 RESISTANT Resistant     CEFEPIME >=64 RESISTANT Resistant     CEFTAZIDIME 16 RESISTANT Resistant     CEFTRIAXONE >=64 RESISTANT Resistant     CIPROFLOXACIN 1 SENSITIVE Sensitive     GENTAMICIN >=16 RESISTANT Resistant     IMIPENEM <=0.25 SENSITIVE Sensitive     TRIMETH/SULFA >=320 RESISTANT Resistant     AMPICILLIN/SULBACTAM >=32 RESISTANT Resistant     PIP/TAZO 8 SENSITIVE Sensitive     Extended ESBL POSITIVE  Resistant     * MODERATE KLEBSIELLA PNEUMONIAE  Fungus Culture With Stain     Status: None   Collection Time: 08/30/16  3:56 PM  Result Value Ref Range Status   Fungus Stain Final report  Final   Fungus (Mycology) Culture Preliminary report  Final    Comment: (NOTE) Performed At: [facility] [place], [place] [id number] Lindon Romp MD TU:[id number]    Fungal Source TRACHEAL ASPIRATE  Final  Fungus Culture Result     Status: None   Collection Time: 08/30/16  3:56 PM  Result Value Ref Range Status   Result 1 Yeast observed  Final    Comment: (NOTE) Performed At: [facility] [place], [place] [id number] Lindon Romp MD XY:[id number]   Fungal organism reflex     Status: None   Collection Time: 08/30/16  3:56 PM  Result Value Ref Range Status   Fungal result 1 Candida albicans  Final    Comment: (NOTE) Light growth Performed At: [facility] [address] [place], [place] [id number] Lindon Romp MD GB:[id number]   Culture, respiratory (NON-Expectorated)     Status: None   Collection Time: 08/31/16  1:20 AM  Result Value Ref Range Status   Specimen Description TRACHEAL ASPIRATE  Final   Special Requests NONE  Final   Gram Stain   Final    ABUNDANT WBC PRESENT,BOTH PMN AND MONONUCLEAR NO ORGANISMS SEEN    Culture   Final    MODERATE ACINETOBACTER CALCOACETICUS/BAUMANNII COMPLEX SUSCEPTIBILITIES PERFORMED ON PREVIOUS CULTURE WITHIN THE LAST 5 DAYS.    Report Status 09/04/2016 FINAL  Final  Culture, respiratory (NON-Expectorated)     Status: None (Preliminary result)   Collection Time: 09/06/16 10:36 PM  Result Value Ref Range Status   Specimen Description TRACHEAL ASPIRATE  Final   Special Requests Normal  Final   Gram Stain   Final    MODERATE WBC PRESENT,BOTH PMN AND MONONUCLEAR FEW GRAM NEGATIVE RODS    Culture ABUNDANT PSEUDOMONAS AERUGINOSA  Final   Report Status PENDING  Incomplete          Radiology Studies: Dg Chest Port 1 View  Result Date: 09/07/2016 CLINICAL DATA:  Acute onset of rhonchi.  Initial encounter. EXAM: PORTABLE CHEST 1 VIEW COMPARISON:  Chest radiograph performed 09/04/2016 FINDINGS: The patient's tracheostomy tube is seen ending 5-6 cm above the carina. A right IJ line is noted ending about the mid SVC. Vascular congestion is noted. Bilateral central airspace opacities may reflect pneumonia or pulmonary edema, more prominent than on the prior study. No definite pleural effusion or pneumothorax is seen. The cardiomediastinal silhouette is borderline normal in size. No acute osseous abnormalities are identified. IMPRESSION: Vascular congestion. Bilateral central airspace opacities may reflect pulmonary edema or pneumonia, more prominent than on the prior study. Electronically Signed   By: Garald Balding M.D.   On: 09/07/2016 06:35        Scheduled Meds: . baclofen  10 mg Oral QID  . budesonide (PULMICORT) nebulizer solution  0.5 mg Nebulization BID  . chlorhexidine gluconate (MEDLINE KIT)  15 mL Mouth Rinse BID  . imipenem-cilastatin  1,000 mg Intravenous Q8H   And  . dexamethasone  4 mg Intravenous Q8H  . insulin aspart  0-15 Units Subcutaneous Q6H  . insulin glargine  12 Units Subcutaneous QHS  . ipratropium  0.5 mg Nebulization BID  . mouth rinse  15 mL Mouth Rinse QID  . midodrine  20 mg Per Tube TID WC  . neomycin-bacitracin-polymyxin  Topical Daily  . oxybutynin  5 mg Per Tube BID  . pantoprazole (PROTONIX) IV  40 mg Intravenous Q24H  . scopolamine  1 patch Transdermal Q72H   Continuous Infusions: . feeding supplement (GLUCERNA 1.2 CAL) 1,000 mL (09/08/16 0700)     LOS: 13 days    Breyonna Nault Tanna Furry, MD [facility] Pager [phone]  If 7PM-7AM, please contact night-coverage [email or web address] Password [email or web address] 09/08/2016, 1:47 PM

## 2016-09-08 NOTE — Progress Notes (Signed)
Patients had temperature this am, other vital signs stable, noted that patients bed is very hot. Bed needs to be replaced. Dr. Posey Pronto was notified with order to recheck temperature by rectal. Bed was replaced this morning and will rechecked temperature after patient was switch to a comfortable bed without overheated mattress. Mom at the bedside and was satisfied that bed was changed. Will follow up temperature of the patient.

## 2016-09-08 NOTE — Progress Notes (Addendum)
Pharmacy Antibiotic Note  Jason [name] is a 30 y.o. male admitted on [id number] with very complicated ID history.  Pt has multiple antibiotic allergies making it very difficult to treat his infections. 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 The patient also has strep constellatus bacteremia. Per ID - will need 6 weeks antibiotics for sacral osteo.  The patient has been on Primaxin pre-medicated with decadron since 11/24. The patient is a quad with a baseline SCr that appears to be between 0.5-0.7. The patient has had episodes of AKI this admit with SCr peaks of 1.33 on 11/23 and 1.44 on 11/29 - now back down to baseline at 0.6, UOP/24: 2.4 ml/kg/hr. The Primaxin dose can be adjusted due to the patient's improving renal function - this was okayed by ID Johnnye Sima). Noted fever spikes (101.3) and new Pseudomonas in the trach aspirate from 11/30.   Plan: - Adjust Primaxin to 1g IV every 8 hours - Continue pre-medication with Decadron 4 mg IV every 8 hours - Will continue to monitor renal function for necessary dose adjustments - Will monitor new cultures and ID plans for antibiotics and LOT  Height: 5\' 8"  (172.7 cm) Weight: 110 lb (49.9 kg) IBW/kg (Calculated) : 68.4  Temp (24hrs), Avg:99.5 F (37.5 C), Min:98.6 F (37 C), Max:101.3 F (38.5 C)   Recent Labs Lab 09/03/16 0527  09/04/16 0500 09/05/16 0500 09/06/16 0228 09/07/16 0342 09/08/16 0653  WBC 23.6*  < > 29.3* 21.1* 41.8* 15.0* 16.3*  CREATININE 0.56*  --  1.11 1.44*  --  0.70 0.60*  < > = values in this interval not displayed.  Estimated Creatinine Clearance: 93.6 mL/min (by C-G formula based on SCr of 0.6 mg/dL (L)).    Allergies  Allergen Reactions  . Amikacin Sulfate Other (See Comments)    Seizure   . Cefuroxime Axetil Anaphylaxis    Tolerated cefepime 12/2015 - with Pepcid/Solu-Medrol  . Ertapenem Other (See  Comments)    Rash and confusion-->tolerated Imipenem   . Morphine And Related Other (See Comments)    Changed mental status, confusion, headache, visual hallucination  . Nsaids Itching and Other (See Comments)    Risk of bleeding, itching  . Penicillins Anaphylaxis and Other (See Comments)    Tolerated Imipenem; no reaction to 7 day course of amoxicillin in 2015 Has patient had a PCN reaction causing immediate rash, facial/tongue/throat swelling, SOB or lightheadedness with hypotension: Yes Has patient had a PCN reaction causing severe rash involving mucus membranes or skin necrosis: No Has patient had a PCN reaction that required hospitalization Yes Has patient had a PCN reaction occurring within the last 10 years: No If all of the above answers are "NO", then may proceed with Cephalo  . Sulfa Antibiotics Anaphylaxis, Shortness Of Breath and Other (See Comments)  . Tessalon [Benzonatate] Anaphylaxis  . Aztreonam Swelling    He appears to tolerate if you premedicate with steroids. Had facial/neck swelling without premedication  . Levaquin [Levofloxacin In D5w] Swelling    Hand and forearm swelling  . Rocephin [Ceftriaxone] Other (See Comments)    Confusion  . Shellfish Allergy Itching and Other (See Comments)    Took benadryl to alleviate reaction  . Miripirium Rash and Other (See Comments)    Change in mental status    Antimicrobials this admission:  Vancomycin 11/20 >>11/21, 11/25 Aztreonam 11/20 >>11/21 (allergy added, swelling of neck 11/21) Tigecycline 11/22 >> 11/24  Linezolid 11/19 >> 11/20; 11/21 >> 11/24 Eraxis 11/24 >>11/26  Imipenem w/ premed 11/24 >>  Microbiology results:  11/19 BCx x 3: Viridans strep - S ceftriaxone, eryth, levo, PCN, vanc 11/19UCx (suprapub cath): >100k col yeast; >100k col E.faecalis; 20k col acinetobacter calcoaceticus/baumannii (known to be colonized per ID) 11/20 MRSA PCR: positive 11/22 Sacral tissue culture: Kleb Pneumo (S-Imipenem) and  E faecalis (S-ampicillin)  11/23 BCx: ngF 11/23 TA: Kleb Pneumo (S-cipro, imipenem) and acinetobacter (S-Unasyn, imipenem, gentamicin) 11/24 TA: Acinetobacter calcoaceticus/baumannii complex 11/30 TA >> Abundant PSA (pending susceptibilities)  Thank you for allowing pharmacy to be a part of this patient's care.  Alycia Rossetti, PharmD, BCPS Clinical Pharmacist Pager: [phone] Clinical phone for 09/08/2016 from 7a-3:30p: [phone] If after 3:30p, please call main pharmacy at: [phone] 09/08/2016 12:11 PM

## 2016-09-09 LAB — GLUCOSE, CAPILLARY
GLUCOSE-CAPILLARY: 142 mg/dL — AB (ref 65–99)
Glucose-Capillary: 265 mg/dL — ABNORMAL HIGH (ref 65–99)
Glucose-Capillary: 460 mg/dL — ABNORMAL HIGH (ref 65–99)

## 2016-09-09 LAB — CBC
HCT: 22.7 % — ABNORMAL LOW (ref 39.0–52.0)
Hemoglobin: 7.4 g/dL — ABNORMAL LOW (ref 13.0–17.0)
MCH: 26.7 pg (ref 26.0–34.0)
MCHC: 32.6 g/dL (ref 30.0–36.0)
MCV: 81.9 fL (ref 78.0–100.0)
PLATELETS: 178 10*3/uL (ref 150–400)
RBC: 2.77 MIL/uL — AB (ref 4.22–5.81)
RDW: 17.1 % — AB (ref 11.5–15.5)
WBC: 16.7 10*3/uL — AB (ref 4.0–10.5)

## 2016-09-09 MED ORDER — LORATADINE 10 MG PO TABS
10.0000 mg | ORAL_TABLET | Freq: Every day | ORAL | Status: DC
  Administered 2016-09-09 – 2016-09-10 (×2): 10 mg via ORAL
  Filled 2016-09-09 (×2): qty 1

## 2016-09-09 MED ORDER — FENTANYL 25 MCG/HR TD PT72
25.0000 ug | MEDICATED_PATCH | TRANSDERMAL | Status: DC
  Administered 2016-09-09: 25 ug via TRANSDERMAL
  Filled 2016-09-09: qty 1

## 2016-09-09 MED ORDER — BUPROPION HCL ER (XL) 150 MG PO TB24
150.0000 mg | ORAL_TABLET | Freq: Every day | ORAL | Status: DC
  Filled 2016-09-09: qty 1

## 2016-09-09 MED ORDER — BUPROPION HCL 100 MG PO TABS
50.0000 mg | ORAL_TABLET | Freq: Three times a day (TID) | ORAL | Status: DC
  Administered 2016-09-09 – 2016-09-10 (×2): 50 mg via ORAL
  Filled 2016-09-09 (×4): qty 0.5

## 2016-09-09 MED ORDER — GI COCKTAIL ~~LOC~~
30.0000 mL | Freq: Once | ORAL | Status: AC
  Administered 2016-09-09: 30 mL via ORAL
  Filled 2016-09-09: qty 30

## 2016-09-09 NOTE — Progress Notes (Signed)
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 In addition, the patient has been increasingly lethargic, with decreased urine output over the 3 days PTA with concerns for sepsis from his sacral decubitus ulcer. Mother reports that the patient had large volume of watery diarrhea.  He was admitted to SDU and Infectious Disease, Gastroenterology, and IR was consulted for further evaluation and management. His ostomy bleeding has stopped immediately after admission and with stable Hb GI held off colonoscopy and apparently has been stable since. Patient has had progressive lethargy and PCCM was consulted 11/21. Plastic surgery was consulted 11/21 and patient was eventually taken to OR on 11/22 for sacral bone biopsy. Patient's mental status continued to deteriorate and ws transferred to the ICU on 11/23. Neurology was consulted and felt like his encephalopathy is related to sepsis. With supportive treatment, antibiotics and vent support he continued to improve and TRH took over 12/1   Assessment & Plan: 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 ([name])   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([place])   History of pulmonary embolism   DVT (deep venous thrombosis) ([name])   Severe protein-calorie malnutrition ([name])   Dysphagia, pharyngoesophageal phase   Decubitus ulcer of right perineal ischial region, stage 4 ([name])   Decubitus ulcer  of left perineal ischial region, stage 4 ([name])   Sacral decubitus ulcer, stage IV ([name])   Multiple allergies   Recurrent UTI   Respiratory failure, chronic ([name])   Aspiration pneumonia of right lower lobe ([name])   Seizures ([name])   Anxiety state   Anemia of chronic disease   Hyponatremia   Encephalopathy   Other acute osteomyelitis, multiple sites ([place])   Lower GI bleed   Viridans streptococci infection   Coma of unknown cause ([name])   Septic shock ([name])   Anaphylactic syndrome   Acute hypoxemic respiratory failure ([name])   Enterococcal infection   Thrombocytopenia ([name])   Sepsis likely Multifactorial Etiologies with Evidence of Septic Arthritis within the Right SI Joint and Acute Osteomyelitis in the Adjacent Right illiac Bone with Changes of Multi Level Osteomyelitis throughout Vertebral Bodies of T8, L1, L2, L4, and L5 with Intramuscular Abscesses involving the Psoas, iliopsoas and iliacus muscles on the Right with Concomitant Dense Consolidation in the Lower lobes B/L compatible with PNA and Streptococcus Bacteremia - ID following, please note multiple antibiotic allergies, for now continue Primaxin with decadron premedication  - SIRS criteria resolved. - Was hydrated with IV fluids. Currently appears euvolemic - sacral bone biopsy 11/22 with ESBL Klebsiella, Enterococcus faecalis, Streptococcus constellatus and mixed anaerobic flora present  Recurrent fever - Patient with increased fever curve last 24 hours, his sacral decubitus wounds actually look good, source probably respiratory  Acute on chronic respiratory failure/tracheostomy dependent/acute on chronic diastolic CHF - 2-D echo 04/03/02 showed normal systolic function. - stable respiratory status today  HCAP with Recent Aspiration PNA - Chronic Tracheostomy - Aggressive pulmonary toilet.  Acute DVT in R Subclavian Vein and  R brachial vein (09/03/16). Likely related to PICC which was removed 11/22.  - PICC has been  removed. Rt ij cvl placed - No heparin drip, no bolus. Stopped 11/27 for low hgb/thrombocytopenia - May need a repeat Dopplers of RUE on 12/4 > if DVT is resolved as PICC has been removed, no need for heparin.   Hypoglycemia / hyperglycemia in the Setting of Brittle Diabetes Mellitus Type 1 - Consider low-dose Lantus when patient resumes tube feeds and if CBGs remained consistently elevated. Currently without DKA.  - For now change insulin/CBGs to Q4h and see if it helps hyperglycemia  Acute encephalopathy - Apparently has been in intermittent and ongoing problem since hospital admission. CT head negative for acute findings. Neurology was consulted and have followed patient, felt to be related to his sepsis. Slowly resolved and now is alert   Hyponatremia >> hypernatremia - follow, currently appears clinically fluid overloaded on CXR. Gentle diuresis, closely monitor  Hypotension - continue Midodrine  Suspected Acute Lower GI Bleed and Blood Loss Anemia in the Setting of Steroid Use s/p Transfusion of 2 units of pRBC's - GI consulted, with resolution of blood per ostomy, that is no urgency to pursue colonoscopy via ostomy. Stable now  Leukocytosis in the setting of Infection, GI Bleed, and Steroid Use - monitor, 15 K today.   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 C6 Spinal Cord Infarct complicated by Neurogenic Bladder and Chronic Large Sacral Ulcer - suprapubic cath, diverting colostomy - on Pantoprazole, Baclofen, scopolamine, oxybutinin,   Leaking G Tube - IR Consulted and Replaced Gastric Bumper without complication  Hx of PE s/p IVC Filter - Not on Anticoagulation due to bleeding  Severe protein calorie malnutrition - Continue tube feeds   DVT prophylaxis: SCD Code Status: Full code Family Communication: d/w mother at bedside Disposition Plan: remain inpatient  Consultants:   ID  GI  Neurology  PCCM  IR   Plastic surgery   Procedures:   11/22 -  sacral bone biopsy   Antimicrobials: Per ID linezolid 11/19>>> 11/24 tigecycline 11/21>>> 11/24 Vanc >>11/25 >> 11/26 Eraxis >>11/24 >> 11/26 Imipenem >>11/24 >>   MICRO:  11/19 BCx x 3: Viridans strep - S ceftriaxone, eryth, levo, PCN, vanc 11/19UCx (suprapub cath): >100k col yeast; >100k col E.faecalis; 20k col acinetobacter calcoaceticus/baumannii (known to be colonized per ID) 11/20 MRSA PCR: positive 11/22 Sacral tissue culture: Kleb Pneumo (S-Imipenem) and E faecalis (S-ampicillin)  11/23 BCx: ngF 11/23 TA: Kleb Pneumo (S-cipro, imipenem) and acinetobacter (S-Unasyn, imipenem, gentamicin) 11/24 TA: Acinetobacter calcoaceticus/baumannii complex 11/30 TA >> Abundant PSA (pending susceptibilities)  Subjective: - no complaints today, alert  Objective: Vitals:   09/09/16 0700 09/09/16 0737 09/09/16 0738 09/09/16 0800  BP: (!) 115/93  (!) 115/93 (!) 133/97  Pulse: 91  (!) 110 (!) 112  Resp: (!) 25  (!) 25 (!) 25  Temp:    (!) 100.7 F (38.2 C)  TempSrc:    Oral  SpO2: 100% 100% 100% 100%  Weight:      Height:        Intake/Output Summary (Last 24 hours) at 09/09/16 1038 Last data filed at 09/09/16 1035  Gross per 24 hour  Intake          2190.67 ml  Output             3675 ml  Net         -1484.33 ml   Filed Weights   09/07/16 0500 09/08/16 0500 09/09/16 0332  Weight:  54 kg (119 lb) 49.9 kg (110 lb) 51.7 kg (114 lb)    Examination: Constitutional: ill appearing, trached, on vent Vitals:   09/09/16 0700 09/09/16 0737 09/09/16 0738 09/09/16 0800  BP: (!) 115/93  (!) 115/93 (!) 133/97  Pulse: 91  (!) 110 (!) 112  Resp: (!) 25  (!) 25 (!) 25  Temp:    (!) 100.7 F (38.2 C)  TempSrc:    Oral  SpO2: 100% 100% 100% 100%  Weight:      Height:       Eyes: PERRL ENMT: Mucous membranes are dry. Poor dentition Neck: normal, supple, trach in place Respiratory: coarse breath sounds bilaterally, no wheezing  Cardiovascular: Regular rate and rhythm, no murmurs /  rubs / gallops. No LE edema.   Abdomen: no tenderness. Bowel sounds positive. Ostomy bag in place Skin: 3 sacral decubs present, no drainage,  Neurologic: UP contractures, paraplegic  Data Reviewed: I have personally reviewed following labs and imaging studies  CBC:  Recent Labs Lab 09/05/16 0500 09/06/16 0228 09/07/16 0342 09/08/16 0653 09/09/16 0310  WBC 21.1* 41.8* 15.0* 16.3* 16.7*  HGB 7.3* 8.2* 7.8* 7.8* 7.4*  HCT 23.1* 24.9* 23.5* 23.8* 22.7*  MCV 85.6 82.5 79.9 80.7 81.9  PLT 43* 56* 60* 102* 456   Basic Metabolic Panel:  Recent Labs Lab 09/03/16 0527 09/04/16 0500 09/05/16 0500 09/07/16 0342 09/08/16 0653  NA 141 144 144 148* 148*  K 3.6 3.8 3.3* 2.8* 3.9  CL 106 109 111 113* 115*  CO2 24 15* 10* 23 27  GLUCOSE 100* 164* 290* 393* 192*  BUN 21* 22* 36* 22* 21*  CREATININE 0.56* 1.11 1.44* 0.70 0.60*  CALCIUM 8.0* 7.9* 7.8* 7.8* 8.2*  MG 1.7 1.6* 1.5*  --  1.4*  PHOS 3.7 4.6 5.9*  --   --    CBG:  Recent Labs Lab 09/08/16 0655 09/08/16 1219 09/08/16 1759 09/08/16 2314 09/09/16 0632  GLUCAP 193* 110* 179* 277* 142*   Urine analysis:    Component Value Date/Time   COLORURINE YELLOW 09/02/2016 1204   APPEARANCEUR CLOUDY (A) 08/17/2016 1204   LABSPEC 1.020 08/20/2016 1204   PHURINE 5.0 08/22/2016 1204   GLUCOSEU NEGATIVE 08/14/2016 1204   HGBUR LARGE (A) 09/01/2016 [place] 08/18/2016 1204   BILIRUBINUR neg 06/28/2014 [place] 09/02/2016 1204   PROTEINUR 30 (A) 08/11/2016 1204   UROBILINOGEN 0.2 07/27/2015 0645   NITRITE NEGATIVE 08/20/2016 1204   LEUKOCYTESUR MODERATE (A) 08/08/2016 1204   Sepsis Labs: Invalid input(s): PROCALCITONIN, LACTICIDVEN  Recent Results (from the past 240 hour(s))  Culture, respiratory (NON-Expectorated)     Status: None   Collection Time: 08/30/16  3:56 PM  Result Value Ref Range Status   Specimen Description TRACHEAL ASPIRATE  Final   Special Requests NONE  Final   Gram  Stain   Final    ABUNDANT WBC PRESENT,BOTH PMN AND MONONUCLEAR MODERATE GRAM POSITIVE COCCI IN PAIRS FEW GRAM VARIABLE ROD FEW YEAST    Culture   Final    MODERATE KLEBSIELLA PNEUMONIAE MODERATE ACINETOBACTER CALCOACETICUS/BAUMANNII COMPLEX Confirmed Extended Spectrum Beta-Lactamase Producer (ESBL) for KLEBSIELLA PNEUMONIAE    Report Status 09/02/2016 FINAL  Final   Organism ID, Bacteria KLEBSIELLA PNEUMONIAE  Final   Organism ID, Bacteria ACINETOBACTER CALCOACETICUS/BAUMANNII COMPLEX  Final      Susceptibility   Acinetobacter calcoaceticus/baumannii complex - MIC*    CEFTAZIDIME >=64 RESISTANT Resistant     CEFTRIAXONE >=64 RESISTANT Resistant  CIPROFLOXACIN >=4 RESISTANT Resistant     GENTAMICIN 4 SENSITIVE Sensitive     IMIPENEM 1 SENSITIVE Sensitive     PIP/TAZO >=128 RESISTANT Resistant     TRIMETH/SULFA >=320 RESISTANT Resistant     CEFEPIME >=64 RESISTANT Resistant     AMPICILLIN/SULBACTAM 4 SENSITIVE Sensitive     * MODERATE ACINETOBACTER CALCOACETICUS/BAUMANNII COMPLEX   Klebsiella pneumoniae - MIC*    AMPICILLIN >=32 RESISTANT Resistant     CEFAZOLIN >=64 RESISTANT Resistant     CEFEPIME >=64 RESISTANT Resistant     CEFTAZIDIME 16 RESISTANT Resistant     CEFTRIAXONE >=64 RESISTANT Resistant     CIPROFLOXACIN 1 SENSITIVE Sensitive     GENTAMICIN >=16 RESISTANT Resistant     IMIPENEM <=0.25 SENSITIVE Sensitive     TRIMETH/SULFA >=320 RESISTANT Resistant     AMPICILLIN/SULBACTAM >=32 RESISTANT Resistant     PIP/TAZO 8 SENSITIVE Sensitive     Extended ESBL POSITIVE Resistant     * MODERATE KLEBSIELLA PNEUMONIAE  Fungus Culture With Stain     Status: None   Collection Time: 08/30/16  3:56 PM  Result Value Ref Range Status   Fungus Stain Final report  Final   Fungus (Mycology) Culture Preliminary report  Final    Comment: (NOTE) Performed At: [facility] [place], [place] [id number] Lindon Romp MD IN:[id number]    Fungal Source  TRACHEAL ASPIRATE  Final  Fungus Culture Result     Status: None   Collection Time: 08/30/16  3:56 PM  Result Value Ref Range Status   Result 1 Yeast observed  Final    Comment: (NOTE) Performed At: [facility] [place], [place] [id number] Lindon Romp MD HU:[id number]   Fungal organism reflex     Status: None   Collection Time: 08/30/16  3:56 PM  Result Value Ref Range Status   Fungal result 1 Candida albicans  Final    Comment: (NOTE) Light growth Performed At: [facility] [address] [place], [place] [id number] Lindon Romp MD ZG:[id number]   Culture, respiratory (NON-Expectorated)     Status: None   Collection Time: 08/31/16  1:20 AM  Result Value Ref Range Status   Specimen Description TRACHEAL ASPIRATE  Final   Special Requests NONE  Final   Gram Stain   Final    ABUNDANT WBC PRESENT,BOTH PMN AND MONONUCLEAR NO ORGANISMS SEEN    Culture   Final    MODERATE ACINETOBACTER CALCOACETICUS/BAUMANNII COMPLEX SUSCEPTIBILITIES PERFORMED ON PREVIOUS CULTURE WITHIN THE LAST 5 DAYS.    Report Status 09/04/2016 FINAL  Final  Culture, respiratory (NON-Expectorated)     Status: None (Preliminary result)   Collection Time: 09/06/16 10:36 PM  Result Value Ref Range Status   Specimen Description TRACHEAL ASPIRATE  Final   Special Requests Normal  Final   Gram Stain   Final    MODERATE WBC PRESENT,BOTH PMN AND MONONUCLEAR FEW GRAM NEGATIVE RODS    Culture   Final    ABUNDANT PSEUDOMONAS AERUGINOSA ABUNDANT STENOTROPHOMONAS MALTOPHILIA SUSCEPTIBILITIES TO FOLLOW    Report Status PENDING  Incomplete    Radiology Studies: No results found. Scheduled Meds: . baclofen  10 mg Oral QID  . budesonide (PULMICORT) nebulizer solution  0.5 mg Nebulization BID  . buPROPion  150 mg Oral Daily  . chlorhexidine gluconate (MEDLINE KIT)  15 mL Mouth Rinse BID  . imipenem-cilastatin  1,000 mg Intravenous Q8H   And  . dexamethasone  4 mg  Intravenous Q8H  .  insulin aspart  0-15 Units Subcutaneous Q6H  . insulin glargine  12 Units Subcutaneous QHS  . ipratropium  0.5 mg Nebulization BID  . loratadine  10 mg Oral Daily  . mouth rinse  15 mL Mouth Rinse QID  . midodrine  20 mg Per Tube TID WC  . oxybutynin  5 mg Per Tube BID  . pantoprazole (PROTONIX) IV  40 mg Intravenous Q24H  . scopolamine  1 patch Transdermal Q72H   Continuous Infusions: . feeding supplement (GLUCERNA 1.2 CAL) 1,000 mL (09/08/16 2037)    Marzetta Board, MD, PhD [facility] Pager [phone]  If 7PM-7AM, please contact night-coverage [email or web address] Password [email or web address] 09/09/2016, 10:38 AM

## 2016-09-09 NOTE — Plan of Care (Signed)
Problem: Pain Managment: Goal: General experience of comfort will improve Outcome: Progressing Discussed plan of care for the night with patient and mother about pain and anxiety medications to help the patient have a calm nights rest with teach back displayed.

## 2016-09-09 NOTE — Plan of Care (Signed)
Problem: Pain Managment: Goal: General experience of comfort will improve Outcome: Progressing Discussed pain medication with patient and family with teach back displayed

## 2016-09-09 NOTE — Progress Notes (Signed)
MD at bedside, patient family request to resume home meds wellbutrin & claritin & change trach. New orders received for wellbutrin & claritin.

## 2016-09-09 NOTE — Progress Notes (Signed)
MD paged to change wellbutrin ER since it can't be crushed, new orders received.

## 2016-09-10 ENCOUNTER — Inpatient Hospital Stay (HOSPITAL_COMMUNITY): Payer: Medicaid Other

## 2016-09-10 DIAGNOSIS — G40911 Epilepsy, unspecified, intractable, with status epilepticus: Secondary | ICD-10-CM

## 2016-09-10 DIAGNOSIS — R401 Stupor: Secondary | ICD-10-CM

## 2016-09-10 DIAGNOSIS — J9611 Chronic respiratory failure with hypoxia: Secondary | ICD-10-CM

## 2016-09-10 DIAGNOSIS — G40901 Epilepsy, unspecified, not intractable, with status epilepticus: Secondary | ICD-10-CM

## 2016-09-10 LAB — BLOOD GAS, ARTERIAL
Acid-Base Excess: 1.5 mmol/L (ref 0.0–2.0)
Bicarbonate: 23.8 mmol/L (ref 20.0–28.0)
FIO2: 100
LHR: 25 {breaths}/min
MECHVT: 550 mL
O2 Saturation: 100 %
PATIENT TEMPERATURE: 98.6
PCO2 ART: 26.5 mmHg — AB (ref 32.0–48.0)
PEEP: 5 cmH2O
PH ART: 7.561 — AB (ref 7.350–7.450)
PO2 ART: 440 mmHg — AB (ref 83.0–108.0)

## 2016-09-10 LAB — CBC
HCT: 22.4 % — ABNORMAL LOW (ref 39.0–52.0)
Hemoglobin: 7.3 g/dL — ABNORMAL LOW (ref 13.0–17.0)
MCH: 26.7 pg (ref 26.0–34.0)
MCHC: 32.6 g/dL (ref 30.0–36.0)
MCV: 82.1 fL (ref 78.0–100.0)
PLATELETS: 224 10*3/uL (ref 150–400)
RBC: 2.73 MIL/uL — ABNORMAL LOW (ref 4.22–5.81)
RDW: 17.2 % — AB (ref 11.5–15.5)
WBC: 14.4 10*3/uL — ABNORMAL HIGH (ref 4.0–10.5)

## 2016-09-10 LAB — TRIGLYCERIDES: Triglycerides: 222 mg/dL — ABNORMAL HIGH (ref <150)

## 2016-09-10 LAB — CULTURE, RESPIRATORY: SPECIAL REQUESTS: NORMAL

## 2016-09-10 LAB — GLUCOSE, CAPILLARY
GLUCOSE-CAPILLARY: 361 mg/dL — AB (ref 65–99)
GLUCOSE-CAPILLARY: 400 mg/dL — AB (ref 65–99)
Glucose-Capillary: 246 mg/dL — ABNORMAL HIGH (ref 65–99)
Glucose-Capillary: 156 mg/dL — ABNORMAL HIGH (ref 65–99)
Glucose-Capillary: 109 mg/dL — ABNORMAL HIGH (ref 65–99)
Glucose-Capillary: 226 mg/dL — ABNORMAL HIGH (ref 65–99)

## 2016-09-10 LAB — CULTURE, RESPIRATORY W GRAM STAIN

## 2016-09-10 LAB — PHENYTOIN LEVEL, TOTAL: Phenytoin Lvl: 20.9 ug/mL — ABNORMAL HIGH (ref 10.0–20.0)

## 2016-09-10 MED ORDER — SODIUM CHLORIDE 0.9 % IV SOLN
1000.0000 mg | INTRAVENOUS | Status: DC
  Filled 2016-09-10: qty 10

## 2016-09-10 MED ORDER — LORAZEPAM 2 MG/ML IJ SOLN
2.0000 mg | Freq: Once | INTRAMUSCULAR | Status: AC
  Administered 2016-09-10: 2 mg via INTRAVENOUS

## 2016-09-10 MED ORDER — ACETAMINOPHEN 10 MG/ML IV SOLN
1000.0000 mg | Freq: Once | INTRAVENOUS | Status: AC
  Administered 2016-09-10: 1000 mg via INTRAVENOUS
  Filled 2016-09-10: qty 100

## 2016-09-10 MED ORDER — MIDAZOLAM BOLUS VIA INFUSION
10.0000 mg | Freq: Once | INTRAVENOUS | Status: AC
  Administered 2016-09-10: 10 mg via INTRAVENOUS
  Filled 2016-09-10: qty 10

## 2016-09-10 MED ORDER — PROPOFOL 1000 MG/100ML IV EMUL
INTRAVENOUS | Status: AC
  Filled 2016-09-10: qty 100

## 2016-09-10 MED ORDER — PROPOFOL 1000 MG/100ML IV EMUL
50.0000 ug/kg/min | INTRAVENOUS | Status: DC
Start: 2016-09-10 — End: 2016-09-13
  Administered 2016-09-10: 50 ug/kg/min via INTRAVENOUS
  Administered 2016-09-10: 120 ug/kg/min via INTRAVENOUS
  Administered 2016-09-10: 50 ug/kg/min via INTRAVENOUS
  Administered 2016-09-10: 120 ug/kg/min via INTRAVENOUS
  Administered 2016-09-10: 25 ug/kg/min via INTRAVENOUS
  Administered 2016-09-10: 50 ug/kg/min via INTRAVENOUS
  Administered 2016-09-11: 60 ug/kg/min via INTRAVENOUS
  Administered 2016-09-11: 120 ug/kg/min via INTRAVENOUS
  Administered 2016-09-11: 40 ug/kg/min via INTRAVENOUS
  Administered 2016-09-11: 60 ug/kg/min via INTRAVENOUS
  Administered 2016-09-11: 120 ug/kg/min via INTRAVENOUS
  Administered 2016-09-12: 60 ug/kg/min via INTRAVENOUS
  Filled 2016-09-10 (×9): qty 100

## 2016-09-10 MED ORDER — MIDAZOLAM BOLUS VIA INFUSION
20.0000 mg | INTRAVENOUS | Status: AC
  Administered 2016-09-10 (×3): 20 mg via INTRAVENOUS
  Filled 2016-09-10 (×3): qty 20

## 2016-09-10 MED ORDER — SODIUM CHLORIDE 0.9 % IV SOLN
1500.0000 mg | Freq: Two times a day (BID) | INTRAVENOUS | Status: DC
  Administered 2016-09-10 – 2016-10-05 (×50): 1500 mg via INTRAVENOUS
  Filled 2016-09-10 (×52): qty 15

## 2016-09-10 MED ORDER — LORAZEPAM 2 MG/ML IJ SOLN
INTRAMUSCULAR | Status: AC
  Filled 2016-09-10: qty 1

## 2016-09-10 MED ORDER — LORAZEPAM 2 MG/ML IJ SOLN
INTRAMUSCULAR | Status: AC
  Filled 2016-09-10: qty 2

## 2016-09-10 MED ORDER — LORAZEPAM 2 MG/ML IJ SOLN
6.0000 mg | Freq: Once | INTRAMUSCULAR | Status: AC
  Administered 2016-09-10: 6 mg via INTRAVENOUS

## 2016-09-10 MED ORDER — DEXTROSE 5 % IV SOLN
2.0000 g | Freq: Three times a day (TID) | INTRAVENOUS | Status: DC
  Administered 2016-09-10 – 2016-09-11 (×3): 2 g via INTRAVENOUS
  Filled 2016-09-10 (×4): qty 2

## 2016-09-10 MED ORDER — GLUCERNA 1.2 CAL PO LIQD
1000.0000 mL | ORAL | Status: DC
  Filled 2016-09-10 (×2): qty 1000

## 2016-09-10 MED ORDER — LORAZEPAM 2 MG/ML IJ SOLN
1.0000 mg | Freq: Once | INTRAMUSCULAR | Status: AC
  Administered 2016-09-10: 1 mg via INTRAVENOUS

## 2016-09-10 MED ORDER — FOSPHENYTOIN SODIUM 500 MG PE/10ML IJ SOLN
1000.0000 mg | INTRAMUSCULAR | Status: AC
  Administered 2016-09-10: 1000 mg via INTRAVENOUS
  Filled 2016-09-10: qty 20

## 2016-09-10 MED ORDER — PHENYTOIN SODIUM 50 MG/ML IJ SOLN
100.0000 mg | Freq: Three times a day (TID) | INTRAMUSCULAR | Status: DC
  Administered 2016-09-10 – 2016-09-12 (×5): 100 mg via INTRAVENOUS
  Filled 2016-09-10 (×6): qty 2

## 2016-09-10 MED ORDER — LORAZEPAM 2 MG/ML IJ SOLN
4.0000 mg | INTRAMUSCULAR | Status: AC
  Filled 2016-09-10: qty 2

## 2016-09-10 MED ORDER — SODIUM CHLORIDE 0.9 % IV SOLN
15.0000 mg/h | INTRAVENOUS | Status: DC
  Administered 2016-09-10: 1 mg/h via INTRAVENOUS
  Administered 2016-09-10 (×2): 20 mg/h via INTRAVENOUS
  Administered 2016-09-10: 10 mg/h via INTRAVENOUS
  Administered 2016-09-10: 20 mg/h via INTRAVENOUS
  Administered 2016-09-10: 80 mg/h via INTRAVENOUS
  Administered 2016-09-10: 10 mg/h via INTRAVENOUS
  Administered 2016-09-10: 20 mg/h via INTRAVENOUS
  Administered 2016-09-10: 80 mg/h via INTRAVENOUS
  Administered 2016-09-11: 20 mg/h via INTRAVENOUS
  Administered 2016-09-11 (×2): 80 mg/h via INTRAVENOUS
  Administered 2016-09-12 – 2016-09-13 (×3): 20 mg/h via INTRAVENOUS
  Administered 2016-09-15 – 2016-09-16 (×3): 50 mg/h via INTRAVENOUS
  Administered 2016-09-16 – 2016-09-21 (×27): 60 mg/h via INTRAVENOUS
  Administered 2016-09-22: 50 mg/h via INTRAVENOUS
  Administered 2016-09-22 (×2): 60 mg/h via INTRAVENOUS
  Administered 2016-09-22 – 2016-09-23 (×2): 30 mg/h via INTRAVENOUS
  Filled 2016-09-10 (×48): qty 50

## 2016-09-10 MED ORDER — LORAZEPAM 2 MG/ML IJ SOLN
4.0000 mg | Freq: Once | INTRAMUSCULAR | Status: AC
  Administered 2016-09-10: 4 mg via INTRAVENOUS

## 2016-09-10 MED ORDER — DEXTROSE 50 % IV SOLN
INTRAVENOUS | Status: AC
  Filled 2016-09-10: qty 50

## 2016-09-10 MED ORDER — PANTOPRAZOLE SODIUM 40 MG PO PACK
40.0000 mg | PACK | Freq: Every day | ORAL | Status: DC
  Administered 2016-09-11 – 2016-10-05 (×24): 40 mg
  Filled 2016-09-10 (×26): qty 20

## 2016-09-10 MED ORDER — MIDAZOLAM BOLUS VIA INFUSION
30.0000 mg | Freq: Once | INTRAVENOUS | Status: AC
  Administered 2016-09-10: 30 mg via INTRAVENOUS
  Filled 2016-09-10: qty 30

## 2016-09-10 MED ORDER — SODIUM CHLORIDE 0.9 % IV SOLN
1000.0000 mg | INTRAVENOUS | Status: AC
  Administered 2016-09-10: 1000 mg via INTRAVENOUS
  Filled 2016-09-10: qty 10

## 2016-09-10 MED ORDER — LEVOFLOXACIN IN D5W 750 MG/150ML IV SOLN
750.0000 mg | INTRAVENOUS | Status: DC
  Administered 2016-09-10: 750 mg via INTRAVENOUS
  Filled 2016-09-10 (×2): qty 150

## 2016-09-10 MED ORDER — MIDAZOLAM BOLUS VIA INFUSION
40.0000 mg | Freq: Once | INTRAVENOUS | Status: DC
  Filled 2016-09-10: qty 40

## 2016-09-10 MED ORDER — SODIUM CHLORIDE 0.9 % IV SOLN
1.0000 mg/h | INTRAVENOUS | Status: DC
  Filled 2016-09-10: qty 10

## 2016-09-10 MED ORDER — LORAZEPAM 2 MG/ML IJ SOLN
1.0000 mg | Freq: Once | INTRAMUSCULAR | Status: AC
  Filled 2016-09-10: qty 1

## 2016-09-10 MED ORDER — SODIUM CHLORIDE 0.9 % IV SOLN
500.0000 mg | Freq: Two times a day (BID) | INTRAVENOUS | Status: DC
  Filled 2016-09-10: qty 5

## 2016-09-10 MED ORDER — VALPROATE SODIUM 500 MG/5ML IV SOLN
1000.0000 mg | Freq: Once | INTRAVENOUS | Status: AC
  Administered 2016-09-10: 1000 mg via INTRAVENOUS
  Filled 2016-09-10: qty 10

## 2016-09-10 MED ORDER — IOPAMIDOL (ISOVUE-300) INJECTION 61%
15.0000 mL | INTRAVENOUS | Status: AC
  Administered 2016-09-10 (×2): 30 mL via ORAL

## 2016-09-10 MED ORDER — FENTANYL CITRATE (PF) 100 MCG/2ML IJ SOLN
25.0000 ug | INTRAMUSCULAR | Status: DC | PRN
  Administered 2016-09-14 – 2016-09-26 (×2): 100 ug via INTRAVENOUS
  Filled 2016-09-10 (×3): qty 2

## 2016-09-10 MED ORDER — DEXAMETHASONE SODIUM PHOSPHATE 4 MG/ML IJ SOLN
4.0000 mg | Freq: Three times a day (TID) | INTRAMUSCULAR | Status: DC
  Administered 2016-09-10 – 2016-09-11 (×2): 4 mg via INTRAVENOUS
  Filled 2016-09-10 (×3): qty 1

## 2016-09-10 MED ORDER — IOPAMIDOL (ISOVUE-300) INJECTION 61%
INTRAVENOUS | Status: AC
  Filled 2016-09-10: qty 100

## 2016-09-10 MED ORDER — DEXTROSE 5 % IV SOLN
0.0000 ug/min | INTRAVENOUS | Status: DC
  Administered 2016-09-11 (×2): 20 ug/min via INTRAVENOUS
  Administered 2016-09-12: 30 ug/min via INTRAVENOUS
  Filled 2016-09-10 (×6): qty 1

## 2016-09-10 NOTE — Progress Notes (Addendum)
09/10/2016  Dr. Leonel Ramsay from neurology at bedside. [facility] MD at bedside. Verbal order 20 mg iv midazolam x 1. Order enacted [phone] Dr. Leonel Ramsay at bedside. Verbal order for 10 mg iv midazolam x 1. Order enacted.  6:21 PM Verbal order Dr. Leonel Ramsay 6 mg  Iv Ativan x 1. Order enacted.  6:27 PM Dr. Leonel Ramsay. Verbal order repeat 20 mg iv midazolam x 1 over 2 min. MD at bedside. Orders enacted.  6:37 PM Neurology MD at bedside, verbal order to repeat 20 mg iv midazolam x 1 over 2 min. Orders enacted. 6:45 PM Nursing note Neurology MD at bedside, verbal order to repeat 20 mg IV midazolam x1 over 2 min. Orders enacted.  6:55 PM Dr. Leonel Ramsay at bedside, verbal order to repeat 10 mg iv midazolam x1 over 2 min. Md to place propofol orders. Orders enacted.  Jesselee Poth, Arville Lime

## 2016-09-10 NOTE — Progress Notes (Addendum)
INFECTIOUS DISEASE PROGRESS NOTE  ID: Jason [name] is a 30 y.o. male with  Active Problems:   Gastroparesis due to DM ([place])   Altered mental status   Neurogenic bladder   Suprapubic catheter ([place])   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 ([place])   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([place])   History of pulmonary embolism   DVT (deep venous thrombosis) ([name])   Severe protein-calorie malnutrition ([place])   Dysphagia, pharyngoesophageal phase   Decubitus ulcer of right perineal ischial region, stage 4 ([place])   Decubitus ulcer of left perineal ischial region, stage 4 ([place])   Sacral decubitus ulcer, stage IV ([name])   Multiple allergies   Recurrent UTI   Respiratory failure, chronic ([name])   Aspiration pneumonia of right lower lobe ([name])   Seizures ([name])   Anxiety state   Anemia of chronic disease   Hyponatremia   Encephalopathy   Other acute osteomyelitis, multiple sites ([place])   Lower GI bleed   Viridans streptococci infection   Coma of unknown cause ([place])   Septic shock ([name])   Anaphylactic syndrome   Acute hypoxemic respiratory failure ([name])   Enterococcal infection   Thrombocytopenia ([name])  Subjective: seizing  Abtx:  Anti-infectives    Start     Dose/Rate Route Frequency Ordered Stop   09/08/16 1700  imipenem-cilastatin (PRIMAXIN) 1,000 mg in sodium chloride 0.9 % 250 mL IVPB     1,000 mg 250 mL/hr over 60 Minutes Intravenous Every 8 hours 09/08/16 1159     09/01/16 1300  anidulafungin (ERAXIS) 100 mg in sodium chloride 0.9 % 100 mL IVPB  Status:  Discontinued     100 mg over 90 Minutes Intravenous Every 24 hours 08/31/16 1240 09/02/16 1252   09/01/16 1200  vancomycin (VANCOCIN) IVPB 750 mg/150 ml premix  Status:  Discontinued     750 mg 150 mL/hr over 60 Minutes Intravenous Every 24 hours 09/01/16 1145 09/01/16 1712   08/31/16 1700  imipenem-cilastatin (PRIMAXIN) 500 mg in sodium chloride 0.9 % 100 mL IVPB  Status:  Discontinued     500 mg 200 mL/hr over  30 Minutes Intravenous Every 8 hours 08/31/16 1240 09/08/16 1159   08/31/16 1300  anidulafungin (ERAXIS) 200 mg in sodium chloride 0.9 % 200 mL IVPB     200 mg over 180 Minutes Intravenous  Once 08/31/16 1240 08/31/16 1810   08/10/2016 0557  tigecycline (TYGACIL) 50 mg in sodium chloride 0.9 % 100 mL IVPB  Status:  Discontinued     50 mg 200 mL/hr over 30 Minutes Intravenous Every 12 hours 08/28/16 1758 08/31/16 1151   08/28/16 1800  tigecycline (TYGACIL) 100 mg in sodium chloride 0.9 % 100 mL IVPB  Status:  Discontinued     100 mg 200 mL/hr over 30 Minutes Intravenous  Once 08/28/16 1758 08/31/16 1424   08/28/16 1300  linezolid (ZYVOX) IVPB 600 mg  Status:  Discontinued     600 mg 300 mL/hr over 60 Minutes Intravenous Every 12 hours 08/28/16 1239 08/31/16 2014   08/28/16 0200  aztreonam (AZACTAM) 1 g in dextrose 5 % 50 mL IVPB  Status:  Discontinued     1 g 100 mL/hr over 30 Minutes Intravenous Every 8 hours 08/27/16 1802 08/28/16 1232   08/27/16 2000  vancomycin (VANCOCIN) 500 mg in sodium chloride 0.9 % 100 mL IVPB  Status:  Discontinued     500 mg 100 mL/hr over 60 Minutes Intravenous Every 12 hours 08/27/16 1802 08/28/16 1239  08/27/16 1800  aztreonam (AZACTAM) 2 g in dextrose 5 % 50 mL IVPB     2 g 100 mL/hr over 30 Minutes Intravenous  Once 08/27/16 1634 08/27/16 1852   08/27/16 1600  levofloxacin (LEVAQUIN) IVPB 750 mg  Status:  Discontinued     750 mg 100 mL/hr over 90 Minutes Intravenous Every 24 hours 08/31/2016 1324 08/14/2016 1522   08/27/16 0000  vancomycin (VANCOCIN) 500 mg in sodium chloride 0.9 % 100 mL IVPB  Status:  Discontinued     500 mg 100 mL/hr over 60 Minutes Intravenous Every 12 hours 08/13/2016 1324 08/12/2016 1522   08/12/2016 2200  aztreonam (AZACTAM) 1 g in dextrose 5 % 50 mL IVPB  Status:  Discontinued     1 g 100 mL/hr over 30 Minutes Intravenous Every 8 hours 08/17/2016 1324 08/12/2016 1522   08/16/2016 1600  linezolid (ZYVOX) tablet 600 mg  Status:  Discontinued      600 mg Oral Every 12 hours 08/09/2016 1458 08/27/16 1347   09/02/2016 1300  levofloxacin (LEVAQUIN) IVPB 750 mg  Status:  Discontinued     750 mg 100 mL/hr over 90 Minutes Intravenous  Once 09/02/2016 1246 08/16/2016 1522   08/15/2016 1300  aztreonam (AZACTAM) 2 g in dextrose 5 % 50 mL IVPB  Status:  Discontinued     2 g 100 mL/hr over 30 Minutes Intravenous  Once 08/25/2016 1246 08/18/2016 1522   09/01/2016 1300  vancomycin (VANCOCIN) IVPB 1000 mg/200 mL premix  Status:  Discontinued     1,000 mg 200 mL/hr over 60 Minutes Intravenous  Once 08/19/2016 1246 08/24/2016 1522      Medications:  Scheduled: . baclofen  10 mg Oral QID  . budesonide (PULMICORT) nebulizer solution  0.5 mg Nebulization BID  . chlorhexidine gluconate (MEDLINE KIT)  15 mL Mouth Rinse BID  . imipenem-cilastatin  1,000 mg Intravenous Q8H   And  . dexamethasone  4 mg Intravenous Q8H  . fentaNYL  25 mcg Transdermal Q72H  . insulin aspart  0-15 Units Subcutaneous Q6H  . insulin glargine  12 Units Subcutaneous QHS  . iopamidol      . ipratropium  0.5 mg Nebulization BID  . levETIRAcetam  500 mg Intravenous Q12H  . LORazepam      . LORazepam      . LORazepam      . LORazepam      . LORazepam  1 mg Intravenous Once  . LORazepam  4 mg Intravenous STAT  . mouth rinse  15 mL Mouth Rinse QID  . midodrine  20 mg Per Tube TID WC  . oxybutynin  5 mg Per Tube BID  . [START ON 09/11/2016] pantoprazole sodium  40 mg Per Tube Daily  . phenytoin (DILANTIN) IV  100 mg Intravenous Q8H  . scopolamine  1 patch Transdermal Q72H    Objective: Vital signs in last 24 hours: Temp:  [97.7 F (36.5 C)-103.9 F (39.9 C)] 103.9 F (39.9 C) (12/04 1702) Pulse Rate:  [90-120] 90 (12/04 1702) Resp:  [0-29] 25 (12/04 1702) BP: (98-171)/(54-110) 102/54 (12/04 1702) SpO2:  [100 %] 100 % (12/04 1702) FiO2 (%):  [30 %-100 %] 100 % (12/04 1542) Weight:  [53.5 kg (118 lb)] 53.5 kg (118 lb) (12/04 0520)   General appearance: siezing, no response Resp:  clear to auscultation bilaterally Cardio: regular rate and rhythm GI: normal findings: bowel sounds normal and soft, non-tender Neurologic: Motor: no gross movements of extrem.   Lab Results  Recent  Labs  09/08/16 0653 09/09/16 0310 09/10/16 0550  WBC 16.3* 16.7* 14.4*  HGB 7.8* 7.4* 7.3*  HCT 23.8* 22.7* 22.4*  NA 148*  --   --   K 3.9  --   --   CL 115*  --   --   CO2 27  --   --   BUN 21*  --   --   CREATININE 0.60*  --   --    Liver Panel  Recent Labs  09/08/16 0653  PROT 4.8*  ALBUMIN 1.4*  AST 19  ALT 7*  ALKPHOS 171*  BILITOT 0.7   Sedimentation Rate No results for input(s): ESRSEDRATE in the last 72 hours. C-Reactive Protein No results for input(s): CRP in the last 72 hours.  Microbiology: Recent Results (from the past 240 hour(s))  Culture, respiratory (NON-Expectorated)     Status: None   Collection Time: 09/06/16 10:36 PM  Result Value Ref Range Status   Specimen Description TRACHEAL ASPIRATE  Final   Special Requests Normal  Final   Gram Stain   Final    MODERATE WBC PRESENT,BOTH PMN AND MONONUCLEAR FEW GRAM NEGATIVE RODS    Culture   Final    ABUNDANT PSEUDOMONAS AERUGINOSA ABUNDANT STENOTROPHOMONAS MALTOPHILIA THE PSEUDOMONAS AERUGINOSA IS A  MULTI-DRUG RESISTANT ORGANISM Results Called to: RN N.[place]  [id number] MLM    Report Status 09/10/2016 FINAL  Final   Organism ID, Bacteria STENOTROPHOMONAS MALTOPHILIA  Final   Organism ID, Bacteria PSEUDOMONAS AERUGINOSA  Final      Susceptibility   Pseudomonas aeruginosa - MIC*    CEFTAZIDIME 8 SENSITIVE Sensitive     CIPROFLOXACIN >=4 RESISTANT Resistant     GENTAMICIN >=16 RESISTANT Resistant     IMIPENEM >=16 RESISTANT Resistant     PIP/TAZO >=128 RESISTANT Resistant     CEFEPIME 8 SENSITIVE Sensitive     * ABUNDANT PSEUDOMONAS AERUGINOSA   Stenotrophomonas maltophilia - MIC*    LEVOFLOXACIN 1 SENSITIVE Sensitive     TRIMETH/SULFA <=20 SENSITIVE Sensitive     * ABUNDANT  STENOTROPHOMONAS MALTOPHILIA    Studies/Results: No results found.   Assessment/Plan: C6 Spinal Infarct Quadraplegia Sepsis Streptococcal constellatus bacteremia Sacral Osteomyelitis Bone Bx 11-22 ESBL klebsiella Enterococcus (S- amp) Strep constellatus HCAP, aspiration Chronic Trach CXR worsening vascular congestion.  Acinetobacter Klebsiella  Total days of antibiotics: 14 Imipenem day 10  Will change to cefepime/levaquin Continue steroid pre-meds Appreciate neuro input. Agree that imipenem may be cause of seizures.  ICU eval-ing pt.  His neck is supple. Agree with not doing tap.          [name] Infectious Diseases (pager) [phone] [email or web address] 09/10/2016, 5:26 PM  LOS: 15 days

## 2016-09-10 NOTE — Progress Notes (Signed)
Subjective: Called to bed side after patient found to be in TC seizure for about 30 minutes. On arrival patient showing TC activity in UE and in face. Eyes deviated down. HE had 2 mg Ativan on arrival . While in room gave total 8 mg Ativan as he is full vent dependent. HE is febrile but not clear as rectal is unable to be obtained. BP and HR remained stable. Fosphenytoin (1070 mg) and Keppra (1 G) have been ordered (loads).   Exam: Vitals:   09/10/16 1542 09/10/16 1601  BP:    Pulse: (!) 120   Resp: (!) 29   Temp:  99.5 F (37.5 C)        Gen: In bed, NAD MS: Altered CN: PERRLA, eyes deviated down, jaw jerking and head jerking.  Motor: Rhythmic UE jerking Sensory: no response.    Pertinent Labs/Diagnostics: none  Etta Quill PA-C [facility] [phone]  Impression: Refractory status epilepticus. He doe shave a history of seizures, though current event of slightly unclear etiology. Imipenem can certainly cause seizures and this is one possibility. No meningismus to suggest CNS infection, but I did discuss with ID choosing antibiotic which would provide CNS penetration.   Recommendations: 1) loaded with 17mg  IV ativan, fosphenytoin, keppra without response 2) Transferred to ICU for initiation of burst suppression, refractory to midazolam despite bolus dosing of 100mg .  3) Started on propofol for burst suppression(mother states that he has had it in the past without reaction)   This patient is critically ill and at significant risk of neurological worsening, death and care requires constant monitoring of vital signs, hemodynamics,respiratory and cardiac monitoring, neurological assessment, discussion with family, other specialists and medical decision making of high complexity. I spent 95 minutes of neurocritical care time  in the care of  this patient.  Roland Rack, MD [facility] [phone]  If 7pm- 7am, please page neurology on call as  listed in [place]. 09/10/2016  7:04 PM   09/10/2016, 4:05 PM

## 2016-09-10 NOTE — Progress Notes (Signed)
Pharmacy Antibiotic Note  Jason [name] is a 30 y.o. male admitted on [id number] with very complicated ID history.  Pt has multiple antibiotic allergies making it very difficult to treat his infections. 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 The patient also has strep constellatus bacteremia. Per ID - will need 6 weeks antibiotics for sacral osteo.  Pseudomonas and Stenotrophomonas in the tracheal aspirate culture from 11/30.      Day # 14 antibiotics, day #10 Imipenem.   Seizure activity today.    Imipenem discontinued as possible cause of seizures.   To begin Cefepime and Levofloxacin for HCAP and continue Decadron pre-medication for antibiotics.  Plan:  Cefepime 2gm IV q8hrs.  Pre-medicate with Decadron 4 mg IV.  Levofloxacin 750 mg IV q24hrs. Scheduled 1hr after Cefepime dose, so additional pre-medication not ordered.  Will follow renal function, tolerance of antibiotics, and clincal progress.  Height: 5\' 8"  (172.7 cm) Weight: 118 lb (53.5 kg) IBW/kg (Calculated) : 68.4  Temp (24hrs), Avg:101.2 F (38.4 C), Min:97.7 F (36.5 C), Max:103.9 F (39.9 C)   Recent Labs Lab 09/04/16 0500 09/05/16 0500 09/06/16 0228 09/07/16 0342 09/08/16 0653 09/09/16 0310 09/10/16 0550  WBC 29.3* 21.1* 41.8* 15.0* 16.3* 16.7* 14.4*  CREATININE 1.11 1.44*  --  0.70 0.60*  --   --     Estimated Creatinine Clearance: 100.3 mL/min (by C-G formula based on SCr of 0.6 mg/dL (L)).    Allergies  Allergen Reactions  . Amikacin Sulfate Other (See Comments)    Seizure   . Cefuroxime Axetil Anaphylaxis    Tolerated cefepime 12/2015 - with Pepcid/Solu-Medrol  . Ertapenem Other (See Comments)    Rash and confusion-->tolerated Imipenem   . Morphine And Related Other (See Comments)    Changed mental status, confusion, headache, visual hallucination  . Nsaids Itching and Other (See Comments)   Risk of bleeding, itching  . Penicillins Anaphylaxis and Other (See Comments)    Tolerated Imipenem; no reaction to 7 day course of amoxicillin in 2015 Has patient had a PCN reaction causing immediate rash, facial/tongue/throat swelling, SOB or lightheadedness with hypotension: Yes Has patient had a PCN reaction causing severe rash involving mucus membranes or skin necrosis: No Has patient had a PCN reaction that required hospitalization Yes Has patient had a PCN reaction occurring within the last 10 years: No If all of the above answers are "NO", then may proceed with Cephalo  . Sulfa Antibiotics Anaphylaxis, Shortness Of Breath and Other (See Comments)  . Tessalon [Benzonatate] Anaphylaxis  . Aztreonam Swelling    He appears to tolerate if you premedicate with steroids. Had facial/neck swelling without premedication  . Levaquin [Levofloxacin In D5w] Swelling    Hand and forearm swelling  . Rocephin [Ceftriaxone] Other (See Comments)    Confusion  . Shellfish Allergy Itching and Other (See Comments)    Took benadryl to alleviate reaction  . Miripirium Rash and Other (See Comments)    Change in mental status    Antimicrobials this admission: Vancomycin 11/20 >>11/21, 11/25 Aztreonam 11/20 >>11/21 (allergy added, swelling of neck 11/21) Tigecycline 11/22 >> 11/24 Linezolid 11/19 >> 11/20; 11/21 >> 11/24 Eraxis 11/24 >>11/26 Imipenem 11/24>>12.4 Cefepime 12/4>> Levofloxacin 12/4>>   Microbiology results: 11/19 BCx x 3: Viridans strep - S ceftriaxone, eryth, levo, PCN, vanc 11/19UCx (suprapub cath): >100k col yeast; >100k col E.faecalis; 20k col acinetobacter calcoaceticus/baumannii (known to be colonized per ID) 11/20  MRSA PCR: positive 11/22 Sacral tissue culture: Kleb Pneumo (S-Imipenem) and E faecalis (S-ampicillin)  11/23 BCx: ngF 11/23 TA: Kleb Pneumo (S-cipro, imipenem) and acinetobacter (S-Unasyn, imipenem, gentamicin) 11/24 TA: Acinetobacter calcoaceticus/baumannii  complex 11/30 TA >> Abundant Pseudomonas - sensitive to Cefepime and Ceftazidime; resistant to Imipenem, Zosyn and Gentamicin   and abundant Stenotrophomonas - sensitive to Levaquin and Septra   Thank you for allowing pharmacy to be a part of this patient's care.  Arty Baumgartner, [place] Pager: [phone] 09/10/2016 7:14 PM

## 2016-09-10 NOTE — Progress Notes (Signed)
09/10/2016 1900 Dr. Leonel Ramsay at bedside with orders to initiate propofol and administer boluses. See MAR for details.  Kiran Lapine, [place] Lime

## 2016-09-10 NOTE — Progress Notes (Addendum)
Prolonged EEG complete. New verbal orders for LTM from Dr Leonel Ramsay once pt changes rooms

## 2016-09-10 NOTE — Progress Notes (Signed)
Patient transported from room 4E16 to room 2S05 without any apparent complications. RT will continue to monitor.

## 2016-09-10 NOTE — Progress Notes (Signed)
Nutrition Follow-up  DOCUMENTATION CODES:   Underweight  INTERVENTION:    Increase Glucerna 1.2 formula to goal rate of 80 ml/hr  TF regimen to provide 2304 kcals, 115 gm protein, 1545 ml of free water  NUTRITION DIAGNOSIS:   Increased nutrient needs related to wound healing as evidenced by estimated needs, ongoing  GOAL:   Patient will meet greater than or equal to 90% of their needs, met  MONITOR:   TF tolerance, Vent status, Labs, Weight trends, Skin, I & O's  ASSESSMENT:   30 y.o. 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 Developed mild hypoxia with increasing lethargy and required intubation on 11/23.  Patient typically receives nocturnal TF with Glucerna 1.2 and eats regular foods during the day.  Patient is currently on ventilator support >> trach Temp (24hrs), Avg:100.5 F (38.1 C), Min:97.7 F (36.5 C), Max:102.9 F (39.4 C)  Glucerna 1.2 formula infusing at goal rate of 70 ml/hr via PEG tube. Pt's acute encephalopathy resolved >> now alert. Palliative Care Team continues to follow. ABX per Infectious Disease. CBG's [phone].  Diet Order:  Diet NPO time specified  Skin:  Wound (see comment) (Stg 2 R elbow, back; stg 2 L heel; stg 4 coccyx, buttocks)  Last BM:  11/30  Height:   Ht Readings from Last 1 Encounters:  08/08/2016 5' 8"  (1.727 m)    Weight: >>> trending down  Wt Readings from Last 1 Encounters:  09/10/16 118 lb (53.5 kg)   12/03  114 lb 12/02  110 lb 12/01  119 lb 11/30  118 lb 11/29  128 lb 11/28  128 lb 11/27  124 lb 11/26  122 lb 11/19  121 lb  Ideal Body  Weight:  63 kg (adjusted for quadriplegia)  BMI:  Body mass index is 17.94 kg/m.  Re-estimated Nutritional Needs:   Kcal:  2200-2400  Protein:  110-120 gm  Fluid:  2.2-2.4 L  EDUCATION NEEDS:   No education needs identified at this time   Arthur Holms, RD, LDN Pager #: [phone] After-Hours Pager #: [phone]

## 2016-09-10 NOTE — Progress Notes (Signed)
09/10/2016  Micro called respiratory culture from tracheal aspiration at 1137 that was collected 09/06/2016 was received 09/07/2016 and was repeated. Said it was gram negative rods and it was growing Pseudomonas Aeruginosa  Which is multidrug resistant. Dr Cruzita Lederer was made aware via [place] at 1200. [facility].

## 2016-09-10 NOTE — Significant Event (Signed)
Rapid Response Event Note  Overview:  RRT page to CT 2 Hallway Time Called: Jason [name] Time: [id number] Event Type: Neurologic  Initial Focused Assessment:  RRT page to CT 2 hallway for patient from [name] having seizure activity.  On my arrival to patients bedside, Respiratory therapy and Swat [name] at bedside.  Patient noted to be seizing as they arrived to CT.  Patient had twitching of arms and face.  Jason [name] primary [name] called to page MD and come to CT scan.  Dr. Darl Householder, EDP came to bedside for assistance, order for 1 mg ativan IV.    Interventions:  Jason [name] at bedside, I mg IV ativan given, patient still with active seizures.  Patient brought back to room with Jason [name] and Jason Duval [name] as per MD on phone  Plan of Care (if not transferred):  MD to evaluate on floor and [name] to monitor patient  Event Summary:  [name] to call if assistance needed  at    at        [facility], Jason [name]

## 2016-09-10 NOTE — Progress Notes (Addendum)
Name: Jason [name] MRN: [id number] DOB: 32-Mar-1987    ADMISSION DATE:  08/25/2016 CONSULTATION DATE:  11/21  REFERRING MD :  [email or web address] ([facility])   CHIEF COMPLAINT:  Lethargy, vent mgmt   BRIEF PATIENT DESCRIPTION: 30 y.o. quadriplegic r/t C6 spinal cord injury, chronic trach, chronic infections in setting of open wounds sacrum and foot. Started on Azactam without premedication and he has become lethargic and has mild swelling around trach without compromise. Solumedrol has been started and he is HD stable. Mother reports this is his usual reaction to Azactam. PCCM saw 11/21 and signed off.  Called back 11/23 for worsening lethargy and request for vent support.    SIGNIFICANT EVENTS: 11/22 - surgical bx sacral bone/wound 12/04 - transfer to ICU with status epilepticus  STUDIES:  CT Head w/o 11/21:  Mild atrophy.  No acute intracranial abnormality. Mucosal thickening within the sphenoid sinus. No air-fluid level is seen.  ANTIBIOTICS:  Per ID Tygacil 11/21 - 11/24 Vancomycin 11/19 - 11/25 Eraxis 11/24 - 11/26 Aztreonam 11/19 - 11/21 Levaquin 11/19 - 11/20 Zyvox 11/19 - 11/24 Imipenem 12/2 >>   MICROBIOLOGY:  Blood Ctx x3 11/19:  3/3 Streptococcus constellatus  Urine Ctx 11/19:  Acinetobacter & Enterococcus faecalis MRSA PCR 11/20:  Positive Sacral Bone Ctx 11/22:  ESBL Kleb pna, Enterococcus faecalis, strep constellatus Blood Ctx x2 11/23:  Negative  Tracheal Asp Ctx 11/23:  Klebsiella pneumoniae & Acinetobacter Tracheal Asp Ctx 11/24:  Klebsiella pneumoniae & Acinetobacter Tracheal Asp Ctx 11/30:  Pseudomonas aeruginosa & Stenotrophomonas   SUBJECTIVE:  Patient went into status epilepticus this afternoon. No response to IV medications. Planning to transition to ICU for burst suppression.  REVIEW OF SYSTEMS:  Unable to obtain with altered mental status from seizure.  VITAL SIGNS: Temp:  [97.7 F (36.5 C)-102.9 F (39.4 C)] 99.5 F (37.5 C) (12/04 1601) Pulse Rate:   [93-120] 120 (12/04 1542) Resp:  [0-29] 29 (12/04 1542) BP: (98-171)/(72-110) 168/110 (12/04 1151) SpO2:  [100 %] 100 % (12/04 1542) FiO2 (%):  [30 %-100 %] 100 % (12/04 1542) Weight:  [118 lb (53.5 kg)] 118 lb (53.5 kg) (12/04 0520)  PHYSICAL EXAMINATION: General:  Mother at bedside. Multiple staff members at bedside.  Integument:  Warm & dry. No rash on exposed skin.  HEENT: Tracheostomy tube in place. Moist mucus membranes.  Cardiovascular:  Regular rhythm. No edema. No appreciable JVD.  Pulmonary:  Coarse breath sounds bilaterally. Symmetric chest wall rise on ventilator. Abdomen: Soft. Normal bowel sounds. Nondistended.  Neurological: Rhythmic facial twitching. Forward gaze. Not responding to demand.   Recent Labs Lab 09/05/16 0500 09/07/16 0342 09/08/16 0653  NA 144 148* 148*  K 3.3* 2.8* 3.9  CL 111 113* 115*  CO2 10* 23 27  BUN 36* 22* 21*  CREATININE 1.44* 0.70 0.60*  GLUCOSE 290* 393* 192*    Recent Labs Lab 09/08/16 0653 09/09/16 0310 09/10/16 0550  HGB 7.8* 7.4* 7.3*  HCT 23.8* 22.7* 22.4*  WBC 16.3* 16.7* 14.4*  PLT 102* 178 224   No results found.  ASSESSMENT / PLAN:  NEUROLOGICAL A: Status Epilepticus Acute Encephalopathy - Secondary to seizures. 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P: Neurology Following & appreciate recommendations AEDs Per Neurology:  Keppra, Dilantin, Versed gtt Sedation:  As per Neurology Continuous EEG Baclofen qid Fentanyl IV prn Pain D/C Fentanyl Duragesic  INFECTIOUS DISEASES A: Sepsis HCAP - Multiple Organisms Recurrent UTI Acute Osteomyelitis - Sacrum. Intra-Muscular Abscesses - R psoas, iliopsoas & iliacus.   P: Awaiting finalization of  cultures Antibiotics per ID Wound Care previously consulted  PULMONARY A: Respiratory Alkalosis Chronic Hypoxic Respiratory Failure - S/P Tracheostomy. HCAP  P: Full Vent Support Holding on SBT & PS until seizures are controlled Pulmicort & Atrovent Nebs  q12hr  RENAL A: Hypernatremia - Mild. Hypokalemia - Resolved. Chronic Suprapubic Catheter H/O Neurogenic Bladder Acute Renal Failure - Resolved. Metabolic Acidosis - Resolved. Lactic Acidosis - Resolved.  P: Monitoring UOP Trending electrolytes & renal function daily Replacing electrolytes as indicated Continuing Ditropan  CARDIOVASCULAR A: Acute on Chronic Diastolic CHF Acute DVT R Subclavian & Brachial Veins - Noted 11/27 & likely due to PICC that was removed 11/22. Chronic Hypotension  P: Continuous telemetry monitoring Vitals per unit protocol Goal MAP >65 Neo-Synephrine to maintain MAP Continuing Midodrine TID  GASTROINTESTINAL A: Severe Protein-Calorie Malnutrition H/O Gastroparesis - Due to DM. Lower GI Bleed  P: NPO Holding on tube feedings Protonix VT daily Holding off on Colonoscopy S/P GI Consultation  HEMATOLOGY/ONCOLOGY A: Leukocytosis - Likely due to sepsis. Improving. Anemia - No signs of active bleeding. Known anemia of chronic disease. H/O PE - S/P IVC Filter.  P: Trending cell counts daily w/ CBC SCDs  ENDOCRINE A: H/O DM Type 1 - Glucose controlled.  P: Lantus 12u Cameron qhs Changing Accu-Checks to q4hr SSI per Moderate Algorithm   FAMILY UPDATE:  Mother updated at bedside by Dr. Ashok Cordia & Dr. Leonel Ramsay on 12/4.   TODAY'S SUMMARY:  30 y.o. with history of multiple medical problems including sepsis from multiple infections sources. Patient currently in status epilepticus. Transferring to ICU for burst suppression and sedation as well as antiepileptic drugs guided by neurology. Appreciate neurology assistance as well as assistance from infectious diseases specialists.  I spent a total of 36 minutes of critical care time today caring for the patient, reviewing the patient's electronic medical record, and discussing the plan of care with neurology as well as the patient's mother bedside.  Sonia Baller Ashok Cordia, M.D. [facility] Pulmonary &  Critical Care Pager:  [phone] After 3pm or if no response, call [phone] 09/10/2016, 5:01 PM

## 2016-09-10 NOTE — Progress Notes (Addendum)
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 In addition, the patient has been increasingly lethargic, with decreased urine output over the 3 days PTA with concerns for sepsis from his sacral decubitus ulcer. Mother reports that the patient had large volume of watery diarrhea.  He was admitted to SDU and Infectious Disease, Gastroenterology, and IR was consulted for further evaluation and management. His ostomy bleeding has stopped immediately after admission and with stable Hb GI held off colonoscopy and apparently has been stable since. Patient has had progressive lethargy and PCCM was consulted 11/21. Plastic surgery was consulted 11/21 and patient was eventually taken to OR on 11/22 for sacral bone biopsy. Patient's mental status continued to deteriorate and ws transferred to the ICU on 11/23. Neurology was consulted and felt like his encephalopathy is related to sepsis. With supportive treatment, antibiotics and vent support he continued to improve and TRH took over 12/1  Assessment & Plan: 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 ([name])   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([place])   History of pulmonary embolism   DVT (deep venous thrombosis) ([name])   Severe protein-calorie malnutrition ([name])   Dysphagia, pharyngoesophageal phase   Decubitus ulcer of right perineal ischial region, stage 4 ([name])   Decubitus ulcer of  left perineal ischial region, stage 4 ([name])   Sacral decubitus ulcer, stage IV ([name])   Multiple allergies   Recurrent UTI   Respiratory failure, chronic ([name])   Aspiration pneumonia of right lower lobe ([name])   Seizures ([name])   Anxiety state   Anemia of chronic disease   Hyponatremia   Encephalopathy   Other acute osteomyelitis, multiple sites ([place])   Lower GI bleed   Viridans streptococci infection   Coma of unknown cause ([place])   Septic shock ([name])   Anaphylactic syndrome   Acute hypoxemic respiratory failure ([name])   Enterococcal infection   Thrombocytopenia ([place])   Addendum Called by RN ~3:26 that patient is about to get the CT and started having seizure episodes. One remote history of seizures in the past in the setting of hypoglycemia. Repeat CBG 109, temp 99.6. Neurology consulted and evaluating patient, s/p Ativan x 4 mg and still seizing, will get keppra and phosphenitoin. Called PCCM to evaluate as likely needs ICU for status.   Sepsis likely Multifactorial Etiologies with Evidence of Septic Arthritis within the Right SI Joint and Acute Osteomyelitis in the Adjacent Right illiac Bone with Changes of Multi Level Osteomyelitis throughout Vertebral Bodies of T8, L1, L2, L4, and L5 with Intramuscular Abscesses involving the Psoas, iliopsoas and iliacus muscles on the Right with Concomitant Dense Consolidation in the Lower lobes B/L compatible with PNA and Streptococcus Bacteremia - ID following, please note multiple antibiotic allergies, for now continue Primaxin with decadron premedication  - SIRS criteria resolved. - Was hydrated with IV fluids. Currently appears euvolemic - sacral bone biopsy 11/22 with ESBL Klebsiella, Enterococcus faecalis, Streptococcus constellatus and mixed anaerobic flora present  Recurrent  fever - Patient with increased fever curve last 3 days, his sacral decubitus wounds actually look good  - repeat CT abdomen pelvis today to evaluate source of  fever  Acute on chronic respiratory failure/tracheostomy dependent/acute on chronic diastolic CHF - 2-D echo 7/34/28 showed normal systolic function. - stable respiratory status today  HCAP with Recent Aspiration PNA - Chronic Tracheostomy - Aggressive pulmonary toilet.  Acute DVT in R Subclavian Vein and R brachial vein (09/03/16). Likely related to PICC which was removed 11/22.  - PICC has been removed. Rt ij cvl placed - No heparin drip, no bolus. Stopped 11/27 for low hgb/thrombocytopenia - repeat Dopplers of RUE today > if DVT is resolved as PICC has been removed, no need for heparin   Hypoglycemia / hyperglycemia in the Setting of Brittle Diabetes Mellitus Type 1 - Consider low-dose Lantus when patient resumes tube feeds and if CBGs remained consistently elevated. Currently without DKA.  - For now change insulin/CBGs to Q4h and see if it helps hyperglycemia  Acute encephalopathy - Apparently has been in intermittent and ongoing problem since hospital admission. CT head negative for acute findings. Neurology was consulted and have followed patient, felt to be related to his sepsis. Slowly resolved and now is alert   Hyponatremia >> hypernatremia - follow, currently appears clinically fluid overloaded on CXR. Gentle diuresis, closely monitor  Hypotension - continue Midodrine  Suspected Acute Lower GI Bleed and Blood Loss Anemia in the Setting of Steroid Use s/p Transfusion of 2 units of pRBC's - GI consulted, with resolution of blood per ostomy, that is no urgency to pursue colonoscopy via ostomy. Stable now  Leukocytosis in the setting of Infection, GI Bleed, and Steroid Use - monitor, 15 K today.   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 C6 Spinal Cord Infarct complicated by Neurogenic Bladder and Chronic Large Sacral Ulcer - suprapubic cath, diverting colostomy - on Pantoprazole, Baclofen, scopolamine, oxybutinin,   Leaking G Tube - IR Consulted and Replaced  Gastric Bumper without complication  Hx of PE s/p IVC Filter - Not on Anticoagulation due to bleeding  Severe protein calorie malnutrition - Continue tube feeds   DVT prophylaxis: SCD Code Status: Full code Family Communication: d/w mother at bedside Disposition Plan: remain inpatient  Consultants:   ID  GI  Neurology  PCCM  IR   Plastic surgery   Procedures:   11/22 - sacral bone biopsy   Antimicrobials: Per ID linezolid 11/19>>> 11/24 tigecycline 11/21>>> 11/24 Vanc >>11/25 >> 11/26 Eraxis >>11/24 >> 11/26 Imipenem >>11/24 >>   MICRO:  11/19 BCx x 3: Viridans strep - S ceftriaxone, eryth, levo, PCN, vanc 11/19UCx (suprapub cath): >100k col yeast; >100k col E.faecalis; 20k col acinetobacter calcoaceticus/baumannii (known to be colonized per ID) 11/20 MRSA PCR: positive 11/22 Sacral tissue culture: Kleb Pneumo (S-Imipenem) and E faecalis (S-ampicillin)  11/23 BCx: ngF 11/23 TA: Kleb Pneumo (S-cipro, imipenem) and acinetobacter (S-Unasyn, imipenem, gentamicin) 11/24 TA: Acinetobacter calcoaceticus/baumannii complex 11/30 TA >> Abundant PSA (pending susceptibilities)  Subjective: - no complaints today, alert.   Objective: Vitals:   09/10/16 0520 09/10/16 0600 09/10/16 0756 09/10/16 0911  BP: 123/89 124/90    Pulse: 96 96    Resp: (!) 25 (!) 23    Temp: (!) 102.9 F (39.4 C)  (!) 102.9 F (39.4 C)   TempSrc: Oral  Oral   SpO2: 100% 100%  100%  Weight: 53.5 kg (118 lb)     Height:        Intake/Output Summary (  Last 24 hours) at 09/10/16 1129 Last data filed at 09/10/16 0600  Gross per 24 hour  Intake          2539.33 ml  Output             2150 ml  Net           389.33 ml   Filed Weights   09/08/16 0500 09/09/16 0332 09/10/16 0520  Weight: 49.9 kg (110 lb) 51.7 kg (114 lb) 53.5 kg (118 lb)    Examination: Constitutional: ill appearing, trached, on vent Vitals:   09/10/16 0520 09/10/16 0600 09/10/16 0756 09/10/16 0911  BP: 123/89  124/90    Pulse: 96 96    Resp: (!) 25 (!) 23    Temp: (!) 102.9 F (39.4 C)  (!) 102.9 F (39.4 C)   TempSrc: Oral  Oral   SpO2: 100% 100%  100%  Weight: 53.5 kg (118 lb)     Height:       Eyes: PERRL ENMT: Mucous membranes are dry. Poor dentition Neck: normal, supple, trach in place Respiratory: coarse breath sounds bilaterally, no wheezing  Cardiovascular: Regular rate and rhythm, no murmurs / rubs / gallops. No LE edema.   Abdomen: no tenderness. Bowel sounds positive. Ostomy bag in place Skin: 3 sacral decubs present, dressed, did not evaluate today  Neurologic: UE contractures, paraplegic  Data Reviewed: I have personally reviewed following labs and imaging studies  CBC:  Recent Labs Lab 09/06/16 0228 09/07/16 0342 09/08/16 0653 09/09/16 0310 09/10/16 0550  WBC 41.8* 15.0* 16.3* 16.7* 14.4*  HGB 8.2* 7.8* 7.8* 7.4* 7.3*  HCT 24.9* 23.5* 23.8* 22.7* 22.4*  MCV 82.5 79.9 80.7 81.9 82.1  PLT 56* 60* 102* 178 409   Basic Metabolic Panel:  Recent Labs Lab 09/04/16 0500 09/05/16 0500 09/07/16 0342 09/08/16 0653  NA 144 144 148* 148*  K 3.8 3.3* 2.8* 3.9  CL 109 111 113* 115*  CO2 15* 10* 23 27  GLUCOSE 164* 290* 393* 192*  BUN 22* 36* 22* 21*  CREATININE 1.11 1.44* 0.70 0.60*  CALCIUM 7.9* 7.8* 7.8* 8.2*  MG 1.6* 1.5*  --  1.4*  PHOS 4.6 5.9*  --   --    CBG:  Recent Labs Lab 09/09/16 0632 09/09/16 1157 09/09/16 2249 09/10/16 0059 09/10/16 0509  GLUCAP 142* 265* 460* 400* 246*   Urine analysis:    Component Value Date/Time   COLORURINE YELLOW 08/23/2016 1204   APPEARANCEUR CLOUDY (A) 08/12/2016 1204   LABSPEC 1.020 08/21/2016 1204   PHURINE 5.0 08/28/2016 1204   GLUCOSEU NEGATIVE 08/17/2016 1204   HGBUR LARGE (A) 08/25/2016 [place] 08/21/2016 1204   BILIRUBINUR neg 06/28/2014 [place] 08/08/2016 1204   PROTEINUR 30 (A) 08/30/2016 1204   UROBILINOGEN 0.2 07/27/2015 0645   NITRITE NEGATIVE 08/30/2016  1204   LEUKOCYTESUR MODERATE (A) 08/18/2016 1204   Sepsis Labs: Invalid input(s): PROCALCITONIN, LACTICIDVEN  Recent Results (from the past 240 hour(s))  Culture, respiratory (NON-Expectorated)     Status: None (Preliminary result)   Collection Time: 09/06/16 10:36 PM  Result Value Ref Range Status   Specimen Description TRACHEAL ASPIRATE  Final   Special Requests Normal  Final   Gram Stain   Final    MODERATE WBC PRESENT,BOTH PMN AND MONONUCLEAR FEW GRAM NEGATIVE RODS    Culture   Final    ABUNDANT PSEUDOMONAS AERUGINOSA ABUNDANT STENOTROPHOMONAS MALTOPHILIA SUSCEPTIBILITIES TO FOLLOW    Report Status PENDING  Incomplete   Organism ID, Bacteria STENOTROPHOMONAS MALTOPHILIA  Final      Susceptibility   Stenotrophomonas maltophilia - MIC*    LEVOFLOXACIN 1 SENSITIVE Sensitive     TRIMETH/SULFA <=20 SENSITIVE Sensitive     * ABUNDANT STENOTROPHOMONAS MALTOPHILIA    Radiology Studies: No results found. Scheduled Meds: . baclofen  10 mg Oral QID  . budesonide (PULMICORT) nebulizer solution  0.5 mg Nebulization BID  . buPROPion  50 mg Oral TID  . chlorhexidine gluconate (MEDLINE KIT)  15 mL Mouth Rinse BID  . imipenem-cilastatin  1,000 mg Intravenous Q8H   And  . dexamethasone  4 mg Intravenous Q8H  . fentaNYL  25 mcg Transdermal Q72H  . insulin aspart  0-15 Units Subcutaneous Q6H  . insulin glargine  12 Units Subcutaneous QHS  . iopamidol  15 mL Oral Q1 Hr x 2  . ipratropium  0.5 mg Nebulization BID  . loratadine  10 mg Oral Daily  . mouth rinse  15 mL Mouth Rinse QID  . midodrine  20 mg Per Tube TID WC  . oxybutynin  5 mg Per Tube BID  . pantoprazole (PROTONIX) IV  40 mg Intravenous Q24H  . scopolamine  1 patch Transdermal Q72H   Continuous Infusions: . feeding supplement (GLUCERNA 1.2 CAL) 1,000 mL (09/10/16 0553)    Marzetta Board, MD, PhD [facility] Pager [phone]  If 7PM-7AM, please contact night-coverage [email or web address] Password  [email or web address] 09/10/2016, 11:29 AM

## 2016-09-10 NOTE — Progress Notes (Signed)
09/10/2016 Patient went to CT with Respiratory Therapist and the SWOT Nurse. Respiratory called Rn think patient was having Seizures. Rn told Respiratory to called Rapid response. Rapid respond told Rn to come down to CT. Dr Cruzita Lederer was made aware that patient was having Seizure. It was reported patient was given Ativan 1 mg while he was downstairs near CT. He was brought back to the room and Md was on unit. Patient blood sugar was 109 and temp rectally was 102. He was given tylenol solution and then a one time dose of Tylenol IV. [facility] RN.

## 2016-09-11 ENCOUNTER — Encounter (HOSPITAL_COMMUNITY): Payer: Self-pay | Admitting: Radiology

## 2016-09-11 ENCOUNTER — Inpatient Hospital Stay (HOSPITAL_COMMUNITY): Payer: Medicaid Other

## 2016-09-11 ENCOUNTER — Telehealth: Payer: Self-pay | Admitting: Family Medicine

## 2016-09-11 DIAGNOSIS — R4020 Unspecified coma: Secondary | ICD-10-CM

## 2016-09-11 DIAGNOSIS — K9423 Gastrostomy malfunction: Secondary | ICD-10-CM

## 2016-09-11 DIAGNOSIS — Z86718 Personal history of other venous thrombosis and embolism: Secondary | ICD-10-CM

## 2016-09-11 DIAGNOSIS — E871 Hypo-osmolality and hyponatremia: Secondary | ICD-10-CM

## 2016-09-11 LAB — BLOOD GAS, ARTERIAL
ACID-BASE DEFICIT: 3.5 mmol/L — AB (ref 0.0–2.0)
Bicarbonate: 18.8 mmol/L — ABNORMAL LOW (ref 20.0–28.0)
DRAWN BY: 441371
FIO2: 30
O2 SAT: 99 %
PEEP/CPAP: 5 cmH2O
PH ART: 7.545 — AB (ref 7.350–7.450)
Patient temperature: 97.5
RATE: 18 resp/min
VT: 560 mL
pCO2 arterial: 21.6 mmHg — ABNORMAL LOW (ref 32.0–48.0)
pO2, Arterial: 131 mmHg — ABNORMAL HIGH (ref 83.0–108.0)

## 2016-09-11 LAB — CBC WITH DIFFERENTIAL/PLATELET
BASOS PCT: 0 %
Basophils Absolute: 0 10*3/uL (ref 0.0–0.1)
EOS ABS: 0 10*3/uL (ref 0.0–0.7)
Eosinophils Relative: 0 %
HCT: 23.5 % — ABNORMAL LOW (ref 39.0–52.0)
Hemoglobin: 8 g/dL — ABNORMAL LOW (ref 13.0–17.0)
Lymphocytes Relative: 12 %
Lymphs Abs: 1.8 10*3/uL (ref 0.7–4.0)
MCH: 27.7 pg (ref 26.0–34.0)
MCHC: 34 g/dL (ref 30.0–36.0)
MCV: 81.3 fL (ref 78.0–100.0)
MONO ABS: 0.9 10*3/uL (ref 0.1–1.0)
Monocytes Relative: 6 %
NEUTROS ABS: 12.3 10*3/uL — AB (ref 1.7–7.7)
Neutrophils Relative %: 82 %
PLATELETS: 349 10*3/uL (ref 150–400)
RBC: 2.89 MIL/uL — ABNORMAL LOW (ref 4.22–5.81)
RDW: 17 % — AB (ref 11.5–15.5)
WBC MORPHOLOGY: INCREASED
WBC: 15 10*3/uL — ABNORMAL HIGH (ref 4.0–10.5)

## 2016-09-11 LAB — PHOSPHORUS
PHOSPHORUS: 3.7 mg/dL (ref 2.5–4.6)
Phosphorus: 3.2 mg/dL (ref 2.5–4.6)

## 2016-09-11 LAB — GLUCOSE, CAPILLARY
GLUCOSE-CAPILLARY: 130 mg/dL — AB (ref 65–99)
GLUCOSE-CAPILLARY: 217 mg/dL — AB (ref 65–99)
GLUCOSE-CAPILLARY: 97 mg/dL (ref 65–99)
Glucose-Capillary: 174 mg/dL — ABNORMAL HIGH (ref 65–99)
Glucose-Capillary: 137 mg/dL — ABNORMAL HIGH (ref 65–99)
Glucose-Capillary: 105 mg/dL — ABNORMAL HIGH (ref 65–99)

## 2016-09-11 LAB — RENAL FUNCTION PANEL
Albumin: 1.5 g/dL — ABNORMAL LOW (ref 3.5–5.0)
Anion gap: 13 (ref 5–15)
BUN: 18 mg/dL (ref 6–20)
CHLORIDE: 104 mmol/L (ref 101–111)
CO2: 18 mmol/L — AB (ref 22–32)
CREATININE: 0.6 mg/dL — AB (ref 0.61–1.24)
Calcium: 7.6 mg/dL — ABNORMAL LOW (ref 8.9–10.3)
GFR calc Af Amer: >60 mL/min (ref >60)
GFR calc non Af Amer: >60 mL/min (ref >60)
GLUCOSE: 183 mg/dL — AB (ref 65–99)
POTASSIUM: 3.4 mmol/L — AB (ref 3.5–5.1)
Phosphorus: 2.7 mg/dL (ref 2.5–4.6)
Sodium: 135 mmol/L (ref 135–145)

## 2016-09-11 LAB — MAGNESIUM
MAGNESIUM: 1.5 mg/dL — AB (ref 1.7–2.4)
MAGNESIUM: 2.6 mg/dL — AB (ref 1.7–2.4)
Magnesium: 1.7 mg/dL (ref 1.7–2.4)

## 2016-09-11 MED ORDER — VITAL HIGH PROTEIN PO LIQD
1000.0000 mL | ORAL | Status: DC
  Administered 2016-09-11: 1000 mL

## 2016-09-11 MED ORDER — SODIUM CHLORIDE 0.9 % IV SOLN
1000.0000 mg | Freq: Once | INTRAVENOUS | Status: AC
  Administered 2016-09-11: 1000 mg via INTRAVENOUS
  Filled 2016-09-11: qty 7.69

## 2016-09-11 MED ORDER — ARTIFICIAL TEARS OP OINT
TOPICAL_OINTMENT | Freq: Three times a day (TID) | OPHTHALMIC | Status: DC
  Administered 2016-09-11: 22:00:00 via OPHTHALMIC
  Administered 2016-09-12: 1 via OPHTHALMIC
  Administered 2016-09-12 – 2016-09-13 (×4): via OPHTHALMIC
  Administered 2016-09-13 – 2016-09-14 (×3): 1 via OPHTHALMIC
  Administered 2016-09-14: 05:00:00 via OPHTHALMIC
  Administered 2016-09-15 (×2): 1 via OPHTHALMIC
  Administered 2016-09-15 – 2016-09-16 (×3): via OPHTHALMIC
  Administered 2016-09-16 – 2016-09-17 (×3): 1 via OPHTHALMIC
  Administered 2016-09-17 – 2016-09-18 (×2): via OPHTHALMIC
  Administered 2016-09-18: 1 via OPHTHALMIC
  Administered 2016-09-18 – 2016-09-19 (×2): via OPHTHALMIC
  Administered 2016-09-19 (×2): 1 via OPHTHALMIC
  Administered 2016-09-20: 14:00:00 via OPHTHALMIC
  Administered 2016-09-20: 1 via OPHTHALMIC
  Administered 2016-09-20 – 2016-09-22 (×7): via OPHTHALMIC
  Administered 2016-09-23: 1 via OPHTHALMIC
  Administered 2016-09-23 – 2016-09-25 (×8): via OPHTHALMIC
  Administered 2016-09-26: 1 via OPHTHALMIC
  Administered 2016-09-26 – 2016-09-27 (×5): via OPHTHALMIC
  Administered 2016-09-28: 1 via OPHTHALMIC
  Administered 2016-09-28 (×2): via OPHTHALMIC
  Administered 2016-09-29: 1 via OPHTHALMIC
  Administered 2016-09-29: 14:00:00 via OPHTHALMIC
  Administered 2016-09-29: 1 via OPHTHALMIC
  Administered 2016-09-30 (×2): via OPHTHALMIC
  Administered 2016-09-30: 1 via OPHTHALMIC
  Administered 2016-10-01 – 2016-10-02 (×5): via OPHTHALMIC
  Administered 2016-10-02: 1 via OPHTHALMIC
  Administered 2016-10-03: 22:00:00 via OPHTHALMIC
  Administered 2016-10-03: 1 via OPHTHALMIC
  Administered 2016-10-03 – 2016-10-05 (×5): via OPHTHALMIC
  Administered 2016-10-05: 1 via OPHTHALMIC
  Filled 2016-09-11 (×8): qty 3.5

## 2016-09-11 MED ORDER — MAGNESIUM SULFATE 4 GM/100ML IV SOLN
4.0000 g | Freq: Once | INTRAVENOUS | Status: AC
  Administered 2016-09-11: 4 g via INTRAVENOUS
  Filled 2016-09-11: qty 100

## 2016-09-11 MED ORDER — DEXTROSE 5 % IV SOLN
2.0000 g | Freq: Three times a day (TID) | INTRAVENOUS | Status: DC
  Administered 2016-09-11 – 2016-09-15 (×11): 2 g via INTRAVENOUS
  Filled 2016-09-11 (×14): qty 2

## 2016-09-11 MED ORDER — IOPAMIDOL (ISOVUE-300) INJECTION 61%
80.0000 mL | Freq: Once | INTRAVENOUS | Status: AC | PRN
  Administered 2016-09-11: 80 mL via INTRAVENOUS

## 2016-09-11 MED ORDER — DEXTROSE 5 % IV SOLN
2.0000 g | Freq: Three times a day (TID) | INTRAVENOUS | Status: DC
  Filled 2016-09-11 (×2): qty 2

## 2016-09-11 MED ORDER — PHENOBARBITAL SODIUM 130 MG/ML IJ SOLN
20.0000 mg/kg | Freq: Once | INTRAMUSCULAR | Status: DC
  Filled 2016-09-11: qty 8.23

## 2016-09-11 MED ORDER — POTASSIUM CHLORIDE 20 MEQ/15ML (10%) PO SOLN
40.0000 meq | Freq: Once | ORAL | Status: AC
  Administered 2016-09-11: 40 meq
  Filled 2016-09-11: qty 30

## 2016-09-11 MED ORDER — MEROPENEM 1 G IV SOLR
1.0000 g | Freq: Three times a day (TID) | INTRAVENOUS | Status: DC
  Administered 2016-09-11: 1 g via INTRAVENOUS
  Filled 2016-09-11 (×3): qty 1

## 2016-09-11 MED ORDER — VITAL AF 1.2 CAL PO LIQD
1000.0000 mL | ORAL | Status: DC
  Administered 2016-09-11 – 2016-09-18 (×8): 1000 mL

## 2016-09-11 MED ORDER — INSULIN ASPART 100 UNIT/ML ~~LOC~~ SOLN
2.0000 [IU] | SUBCUTANEOUS | Status: DC
  Administered 2016-09-11 (×2): 2 [IU] via SUBCUTANEOUS
  Administered 2016-09-11 – 2016-09-12 (×2): 4 [IU] via SUBCUTANEOUS
  Administered 2016-09-12: 2 [IU] via SUBCUTANEOUS
  Administered 2016-09-12 – 2016-09-13 (×5): 6 [IU] via SUBCUTANEOUS
  Administered 2016-09-13 (×2): 4 [IU] via SUBCUTANEOUS
  Administered 2016-09-13: 6 [IU] via SUBCUTANEOUS
  Administered 2016-09-13 – 2016-09-14 (×3): 4 [IU] via SUBCUTANEOUS
  Administered 2016-09-14: 6 [IU] via SUBCUTANEOUS
  Administered 2016-09-14 – 2016-09-15 (×2): 2 [IU] via SUBCUTANEOUS
  Administered 2016-09-15 (×2): 4 [IU] via SUBCUTANEOUS
  Administered 2016-09-15: 2 [IU] via SUBCUTANEOUS
  Administered 2016-09-15: 3 [IU] via SUBCUTANEOUS
  Administered 2016-09-16 – 2016-09-17 (×2): 2 [IU] via SUBCUTANEOUS
  Administered 2016-09-17: 6 [IU] via SUBCUTANEOUS
  Administered 2016-09-17: 4 [IU] via SUBCUTANEOUS
  Administered 2016-09-18: 6 [IU] via SUBCUTANEOUS
  Administered 2016-09-19: 2 [IU] via SUBCUTANEOUS

## 2016-09-11 MED ORDER — CHLORHEXIDINE GLUCONATE 0.12% ORAL RINSE (MEDLINE KIT)
15.0000 mL | Freq: Two times a day (BID) | OROMUCOSAL | Status: DC
  Administered 2016-09-11 – 2016-10-05 (×48): 15 mL via OROMUCOSAL

## 2016-09-11 MED ORDER — SULFAMETHOXAZOLE-TRIMETHOPRIM 400-80 MG/5ML IV SOLN
240.0000 mg | Freq: Three times a day (TID) | INTRAVENOUS | Status: DC
  Administered 2016-09-11 – 2016-09-15 (×12): 240 mg via INTRAVENOUS
  Filled 2016-09-11 (×22): qty 15

## 2016-09-11 MED ORDER — ORAL CARE MOUTH RINSE
15.0000 mL | OROMUCOSAL | Status: DC
  Administered 2016-09-11 – 2016-10-05 (×239): 15 mL via OROMUCOSAL

## 2016-09-11 MED ORDER — DEXAMETHASONE SODIUM PHOSPHATE 4 MG/ML IJ SOLN
4.0000 mg | Freq: Three times a day (TID) | INTRAMUSCULAR | Status: DC
  Administered 2016-09-11 – 2016-09-15 (×12): 4 mg via INTRAVENOUS
  Filled 2016-09-11 (×12): qty 1

## 2016-09-11 MED ORDER — PRO-STAT SUGAR FREE PO LIQD
30.0000 mL | Freq: Two times a day (BID) | ORAL | Status: DC
  Administered 2016-09-11: 30 mL

## 2016-09-11 NOTE — Progress Notes (Signed)
Subjective: Intubated and sedated  Exam: Vitals:   09/11/16 0730 09/11/16 0744  BP: 101/62 101/62  Pulse:  65  Resp: (!) 25 (!) 25  Temp:  97.5 F (36.4 C)   Gen: In bed, NAD Resp: non-labored breathing, no acute distress Abd: soft, nt  Neuro: MS: does not open eyes  [id number] fixed, no corneal Motor: no movement to nox stim Sensory:as above  Pertinent Labs: Cr 0.6   Impression: 30 yo M with new onset refratory status epilepticus, now in burst suppression. He received keppra, dilantin, depakote and was started on a versed infusion. Due to incompltete control despite 2mg /kg bolus with versed(after 15mg  ativan) he was started on propofol. This achieved a good burst suppression pattern, but due to concern for continued generalized discharges, a dose of phenobarbital was given as well. Since that time, he has been well suppressed.    Recommendations: 1) decrease propofol/versed to goal of one burst per page 2) continue keppra, dilantin 3) avoid seizure threshold lowering agents to the extent possible 4) will follow.   This patient is critically ill and at significant risk of neurological worsening, death and care requires constant monitoring of vital signs, hemodynamics,respiratory and cardiac monitoring, neurological assessment, discussion with family, other specialists and medical decision making of high complexity. I spent 35 minutes of neurocritical care time  in the care of  this patient.  Roland Rack, MD [facility] [phone]  If 7pm- 7am, please page neurology on call as listed in [place]. 09/11/2016  11:40 AM

## 2016-09-11 NOTE — Telephone Encounter (Signed)
Russell would like to have supporting documents for the pt to have the Afflovest (Compressing Vest) from dates 05-06-16 if that is not available anything after 06/06/16.

## 2016-09-11 NOTE — Procedures (Addendum)
History: 30 yo M with new onset status epielticus  Sedation: Ativa, then propofol/versed.   Technique: This is a 21 channel continuous scalp video-EEG performed at the bedside with bipolar and monopolar montages arranged in accordance to the international 10/20 system of electrode placement. One channel was dedicated to EKG recording. This recorded from 12/4 at 4:37 until 12/5 at 7:30 am with an interupption as the patient was transferred to the ICU.    Background: At the onset of the recordign there are generalized spike and wave discharges at 3Hz  with some superimposed fast activity. There is clear evolution to support an ictal nature. The fast activity and superimposed muscle became less apparent following ativan administration, but the spiek and wave discharges continued. They decreased in amplitude with further treatment and became more of a GPED pattern but still with a frequency of 3 hz by 6pm. These GPEDs continued with occasional evolution until attainment of a burst suppression pattern at 7:18 pm. The bursts, though brief(1-3 seconds) with an interburst interval  of 3 - 7 seconds continued to have an appearance reminiscent of the final morphology of the GPEDs until following a dose of phenobarbital at which point the interburst interval increased to 40-50 seconds(around 3:30 am) and remained in this pattern for the remainder of the recording.   Photic stimulation: Physiologic driving is not performed  EEG Abnormalities: 1) Generalized status epilepticus  2) Burst suppression pattern  Clinical Interpretation: This EEG is consistent with generalized status epilepticus with subsequent control via medication induced coma.   Roland Rack, MD [facility] [phone]  If 7pm- 7am, please page neurology on call as listed in [place].

## 2016-09-11 NOTE — Progress Notes (Signed)
EEG Leads removed while in CT, patient glued again and new electrodes placed on patients head. Electrode impedances are 5k ohms and under.

## 2016-09-11 NOTE — Significant Event (Signed)
EEG reviewed. At 5 microvolt/mm sensitivity the pattern is nearly flatline. With sensitivity increased to 1 microvolt/mm, generalized epileptiform discharges are seen at regular intervals. Indeterminate as to whether these represent GPEDs or low amplitude discharges in the setting of nonconvulsive status. Vitals are stable. Given lack of further improvement after several hours on propofol and Versed gtt, 20 mg/kg IV phenobarbital dose x 1 has been ordered. Will review EEG again to assess for possible changes/resolution of epileptiform discharges.   Electronically signed: Dr. Kerney Elbe

## 2016-09-11 NOTE — Progress Notes (Addendum)
Nutrition Consult/Follow Up  DOCUMENTATION CODES:   Underweight  INTERVENTION:    D/C Vital High Protein   Initiate Vital AF 1.2 at goal rate of 60 ml/h (1440 ml per day)    TF regimen + current Propofol to provide 2153 kcals, 108 gm protein, 1168 ml of free water  NUTRITION DIAGNOSIS:   Increased nutrient needs related to wound healing as evidenced by estimated needs, ongoing  GOAL:   Patient will meet greater than or equal to 90% of their needs, met  MONITOR:   TF tolerance, Vent status, Labs, Weight trends, Skin, I & O's  ASSESSMENT:   30 y.o. 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  Patient typically receives nocturnal TF with Glucerna 1.2 and eats regular foods during the day.  Patient is currently intubated on ventilator support MV: 10.4  L/min Temp (24hrs), Avg:99.5 F (37.5 C), Min:97.5 F (36.4 C), Max:103.9 F (39.9 C)  Propofol: 16.1 ml/hr >>> 425 fat kcals   Pt transferred to 2S-SICU due to new onset refractory status epilepticus. Glucerna 1.2 formula previous infusing via PEG tube >> D/C'd. Vital High Protein currently infusing at 40 ml/hr. ABX per Infectious Disease. CBG's [id number].  Diet Order:  Diet NPO time specified  Skin:  Wound (see comment) (Stg 2 R elbow, back; stg 2 L heel; stg 4 coccyx, buttocks)  Last BM:  12/4  Height:   Ht Readings from Last 1 Encounters:  08/10/2016 5' 8"  (1.727 m)    Weight: >>> fluctuating   Wt Readings from Last 1 Encounters:  09/11/16 101 lb (45.8 kg)   12/04  118 lb 12/03  114 lb 12/02  110 lb 12/01  119 lb 11/30  118 lb 11/29  128 lb 11/28  128 lb 11/27  124 lb 11/26  122 lb 11/19  121 lb  Ideal Body Weight:   63 kg (adjusted for quadriplegia)  BMI:  Body mass index is 15.36 kg/m.  Re-estimated Nutritional Needs:   Kcal:  2098  Protein:  100-110 gm  Fluid:  per MD  EDUCATION NEEDS:   No education needs identified at this time   Arthur Holms, RD, LDN Pager #: [phone] After-Hours Pager #: [phone]

## 2016-09-11 NOTE — Significant Event (Signed)
EEG improved after administration of phenobarbital. EKG artifact noted. Burst suppression pattern with rare bursts and mostly flatline activity. Will hold off on further phenobarbital. Continue Versed and propofol drips for now. Continue Keppra and phenytoin.   Electronically signed: Dr. Kerney Elbe

## 2016-09-11 NOTE — Progress Notes (Addendum)
INFECTIOUS DISEASE PROGRESS NOTE  ID: Jason [name] is a 30 y.o. 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 ([place])   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([place])   History of pulmonary embolism   DVT (deep venous thrombosis) ([name])   Severe protein-calorie malnutrition ([place])   Dysphagia, pharyngoesophageal phase   Decubitus ulcer of right perineal ischial region, stage 4 ([place])   Decubitus ulcer of left perineal ischial region, stage 4 ([place])   Sacral decubitus ulcer, stage IV ([name])   Multiple allergies   Recurrent UTI   Respiratory failure, chronic ([place])   Aspiration pneumonia of right lower lobe ([name])   Seizures ([name])   Anxiety state   Anemia of chronic disease   Hyponatremia   Encephalopathy   Other acute osteomyelitis, multiple sites ([place])   Lower GI bleed   Viridans streptococci infection   Coma of unknown cause ([place])   Septic shock ([name])   Anaphylactic syndrome   Acute hypoxemic respiratory failure ([place])   Enterococcal infection   Thrombocytopenia ([place])   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[place])   Chronic respiratory failure with hypoxia ([name])  Subjective: Heavily sedated  No response  Abtx:  Anti-infectives    Start     Dose/Rate Route Frequency Ordered Stop   09/10/16 2100  levofloxacin (LEVAQUIN) IVPB 750 mg     750 mg 100 mL/hr over 90 Minutes Intravenous Every 24 hours 09/10/16 1849     09/10/16 2000  ceFEPIme (MAXIPIME) 2 g in dextrose 5 % 50 mL IVPB     2 g 100 mL/hr over 30 Minutes Intravenous Every 8 hours 09/10/16 1847     09/08/16 1700  imipenem-cilastatin (PRIMAXIN) 1,000 mg in sodium chloride 0.9 % 250 mL IVPB  Status:  Discontinued     1,000 mg 250 mL/hr over 60 Minutes Intravenous Every 8 hours 09/08/16 1159 09/10/16 1736   09/01/16 1300  anidulafungin (ERAXIS) 100 mg in sodium chloride 0.9 % 100 mL  IVPB  Status:  Discontinued     100 mg over 90 Minutes Intravenous Every 24 hours 08/31/16 1240 09/02/16 1252   09/01/16 1200  vancomycin (VANCOCIN) IVPB 750 mg/150 ml premix  Status:  Discontinued     750 mg 150 mL/hr over 60 Minutes Intravenous Every 24 hours 09/01/16 1145 09/01/16 1712   08/31/16 1700  imipenem-cilastatin (PRIMAXIN) 500 mg in sodium chloride 0.9 % 100 mL IVPB  Status:  Discontinued     500 mg 200 mL/hr over 30 Minutes Intravenous Every 8 hours 08/31/16 1240 09/08/16 1159   08/31/16 1300  anidulafungin (ERAXIS) 200 mg in sodium chloride 0.9 % 200 mL IVPB     200 mg over 180 Minutes Intravenous  Once 08/31/16 1240 08/31/16 1810   08/17/2016 0557  tigecycline (TYGACIL) 50 mg in sodium chloride 0.9 % 100 mL IVPB  Status:  Discontinued     50 mg 200 mL/hr over 30 Minutes Intravenous Every 12 hours 08/28/16 1758 08/31/16 1151   08/28/16 1800  tigecycline (TYGACIL) 100 mg in sodium chloride 0.9 % 100 mL IVPB  Status:  Discontinued     100 mg 200 mL/hr over 30 Minutes Intravenous  Once 08/28/16 1758 08/31/16 1424   08/28/16 1300  linezolid (ZYVOX) IVPB 600 mg  Status:  Discontinued     600 mg 300 mL/hr over 60  Minutes Intravenous Every 12 hours 08/28/16 1239 08/31/16 2014   08/28/16 0200  aztreonam (AZACTAM) 1 g in dextrose 5 % 50 mL IVPB  Status:  Discontinued     1 g 100 mL/hr over 30 Minutes Intravenous Every 8 hours 08/27/16 1802 08/28/16 1232   08/27/16 2000  vancomycin (VANCOCIN) 500 mg in sodium chloride 0.9 % 100 mL IVPB  Status:  Discontinued     500 mg 100 mL/hr over 60 Minutes Intravenous Every 12 hours 08/27/16 1802 08/28/16 1239   08/27/16 1800  aztreonam (AZACTAM) 2 g in dextrose 5 % 50 mL IVPB     2 g 100 mL/hr over 30 Minutes Intravenous  Once 08/27/16 1634 08/27/16 1852   08/27/16 1600  levofloxacin (LEVAQUIN) IVPB 750 mg  Status:  Discontinued     750 mg 100 mL/hr over 90 Minutes Intravenous Every 24 hours 09/06/2016 1324 08/14/2016 1522   08/27/16 0000   vancomycin (VANCOCIN) 500 mg in sodium chloride 0.9 % 100 mL IVPB  Status:  Discontinued     500 mg 100 mL/hr over 60 Minutes Intravenous Every 12 hours 08/24/2016 1324 08/13/2016 1522   08/23/2016 2200  aztreonam (AZACTAM) 1 g in dextrose 5 % 50 mL IVPB  Status:  Discontinued     1 g 100 mL/hr over 30 Minutes Intravenous Every 8 hours 08/17/2016 1324 08/23/2016 1522   08/25/2016 1600  linezolid (ZYVOX) tablet 600 mg  Status:  Discontinued     600 mg Oral Every 12 hours 08/15/2016 1458 08/27/16 1347   09/04/2016 1300  levofloxacin (LEVAQUIN) IVPB 750 mg  Status:  Discontinued     750 mg 100 mL/hr over 90 Minutes Intravenous  Once 08/25/2016 1246 08/18/2016 1522   08/13/2016 1300  aztreonam (AZACTAM) 2 g in dextrose 5 % 50 mL IVPB  Status:  Discontinued     2 g 100 mL/hr over 30 Minutes Intravenous  Once 08/13/2016 1246 08/25/2016 1522   08/22/2016 1300  vancomycin (VANCOCIN) IVPB 1000 mg/200 mL premix  Status:  Discontinued     1,000 mg 200 mL/hr over 60 Minutes Intravenous  Once 09/06/2016 1246 08/23/2016 1522      Medications:  Scheduled: . baclofen  10 mg Oral QID  . budesonide (PULMICORT) nebulizer solution  0.5 mg Nebulization BID  . ceFEPime (MAXIPIME) IV  2 g Intravenous Q8H   And  . dexamethasone  4 mg Intravenous Q8H  . chlorhexidine gluconate (MEDLINE KIT)  15 mL Mouth Rinse BID  . feeding supplement (PRO-STAT SUGAR FREE 64)  30 mL Per Tube BID  . feeding supplement (VITAL HIGH PROTEIN)  1,000 mL Per Tube Q24H  . insulin aspart  2-6 Units Subcutaneous Q4H  . insulin glargine  12 Units Subcutaneous QHS  . ipratropium  0.5 mg Nebulization BID  . levETIRAcetam  1,500 mg Intravenous Q12H  . levofloxacin (LEVAQUIN) IV  750 mg Intravenous Q24H  . magnesium sulfate 1 - 4 g bolus IVPB  4 g Intravenous Once  . mouth rinse  15 mL Mouth Rinse 10 times per day  . midazolam  40 mg Intravenous Once  . midodrine  20 mg Per Tube TID WC  . oxybutynin  5 mg Per Tube BID  . pantoprazole sodium  40 mg Per Tube Daily    . phenytoin (DILANTIN) IV  100 mg Intravenous Q8H  . potassium chloride  40 mEq Per Tube Once  . scopolamine  1 patch Transdermal Q72H    Objective: Vital signs in last  24 hours: Temp:  [97.5 F (36.4 C)-103.9 F (39.9 C)] 97.5 F (36.4 C) (12/05 0744) Pulse Rate:  [65-120] 65 (12/05 0744) Resp:  [25-29] 25 (12/05 0744) BP: (96-168)/(54-110) 101/62 (12/05 0744) SpO2:  [96 %-100 %] 100 % (12/05 0744) FiO2 (%):  [30 %-100 %] 30 % (12/05 0744) Weight:  [45.8 kg (101 lb)] 45.8 kg (101 lb) (12/05 0400)   General appearance: no distress and no response Neck: R IJ clean.  Resp: clear to auscultation bilaterally Cardio: regular rate and rhythm GI: normal findings: bowel sounds normal and soft, non-tender  Lab Results  Recent Labs  09/10/16 0550 09/11/16 0500  WBC 14.4* 15.0*  HGB 7.3* 8.0*  HCT 22.4* 23.5*  NA  --  135  K  --  3.4*  CL  --  104  CO2  --  18*  BUN  --  18  CREATININE  --  0.60*   Liver Panel  Recent Labs  09/11/16 0500  ALBUMIN 1.5*   Sedimentation Rate No results for input(s): ESRSEDRATE in the last 72 hours. C-Reactive Protein No results for input(s): CRP in the last 72 hours.  Microbiology: Recent Results (from the past 240 hour(s))  Culture, respiratory (NON-Expectorated)     Status: None   Collection Time: 09/06/16 10:36 PM  Result Value Ref Range Status   Specimen Description TRACHEAL ASPIRATE  Final   Special Requests Normal  Final   Gram Stain   Final    MODERATE WBC PRESENT,BOTH PMN AND MONONUCLEAR FEW GRAM NEGATIVE RODS    Culture   Final    ABUNDANT PSEUDOMONAS AERUGINOSA ABUNDANT STENOTROPHOMONAS MALTOPHILIA THE PSEUDOMONAS AERUGINOSA IS A  MULTI-DRUG RESISTANT ORGANISM Results Called to: RN N.WELLINGTON  [id number] MLM    Report Status 09/10/2016 FINAL  Final   Organism ID, Bacteria STENOTROPHOMONAS MALTOPHILIA  Final   Organism ID, Bacteria PSEUDOMONAS AERUGINOSA  Final      Susceptibility   Pseudomonas aeruginosa -  MIC*    CEFTAZIDIME 8 SENSITIVE Sensitive     CIPROFLOXACIN >=4 RESISTANT Resistant     GENTAMICIN >=16 RESISTANT Resistant     IMIPENEM >=16 RESISTANT Resistant     PIP/TAZO >=128 RESISTANT Resistant     CEFEPIME 8 SENSITIVE Sensitive     * ABUNDANT PSEUDOMONAS AERUGINOSA   Stenotrophomonas maltophilia - MIC*    LEVOFLOXACIN 1 SENSITIVE Sensitive     TRIMETH/SULFA <=20 SENSITIVE Sensitive     * ABUNDANT STENOTROPHOMONAS MALTOPHILIA    Studies/Results: No results found.   Assessment/Plan: C6 Spinal Infarct Quadraplegia Sepsis Streptococcal constellatus bacteremia Sacral Osteomyelitis Bone Bx 11-22 ESBL klebsiella Enterococcus (S- amp) Strep constellatus HCAP, aspiration Chronic Trach CXR worsening vascular congestion.  11-23Acinetobacter, Klebsiella  11-30 stenotrophomonas, pseudomonas Status epiliepticus  Total days of antibiotics: 15 Imipenem day 10 (off 12-4) 12-4 levaquin/cefepime  Tygacil 11/21 - 11/24 Vancomycin 11/19 - 11/25 Eraxis 11/24 - 11/26 Aztreonam 11/19 - 11/21 Levaquin 11/19 - 11/20 Zyvox 11/19 - 11/24  Will watch "status" now off imipenem.  Appreciate neuro f/u Discussed anbx with him- will change him to [facility] and bactrim for least epileptogenic combination.  Temp 101 o/n. Not clear if infection or seizures.   Will stop anbx at day 5 CT abd/pelvis pending.          [name] Infectious Diseases (pager) [phone] [email or web address] 09/11/2016, 10:28 AM  LOS: 16 days

## 2016-09-11 NOTE — Progress Notes (Addendum)
*  PRELIMINARY RESULTS* Vascular Ultrasound Right upper extremity venous duplex has been completed.  Preliminary findings: Right upper extremity is positive for deep vein thrombosis in the subclavian vein and superficial vein thrombosis in the basilic vein. Findings are similar to prior study 09/03/2016.  Everrett Coombe 09/11/2016, 11:39 AM

## 2016-09-11 NOTE — Telephone Encounter (Signed)
I left a voice message for Jason [name] with home care, she will need to contact provider that prescribed the vest, this is not something we prescribe.

## 2016-09-11 NOTE — Progress Notes (Signed)
Pt transported on vent to CT and back to 2S05.  Vitals remained stable throughout.

## 2016-09-11 NOTE — Progress Notes (Signed)
Pharmacy Antibiotic Note  Jason [name] is a 30 y.o. male admitted on [id number] with very complicated ID hx. .  Pharmacy has been consulted for septra and merrem dosing. Abx changed from imipenem>>>cefepime/lvq>> merrem and septra by ID for least epileptogenic combination.   Wt 45.8 kg,WBC 15, ANC 12.3, creat 0.6 (quad). Tmax 101.   Plan: Merrem 1 gm IV q8h Septra 240 mg IV q8h ( ~ 5 mg/kg/dose) Plan for 5 days of abx per ID note  Height: 5\' 8"  (172.7 cm) Weight: 101 lb (45.8 kg) IBW/kg (Calculated) : 68.4  Temp (24hrs), Avg:99.9 F (37.7 C), Min:97.5 F (36.4 C), Max:103.9 F (39.9 C)   Recent Labs Lab 09/05/16 0500  09/07/16 0342 09/08/16 0653 09/09/16 0310 09/10/16 0550 09/11/16 0500  WBC 21.1*  < > 15.0* 16.3* 16.7* 14.4* 15.0*  CREATININE 1.44*  --  0.70 0.60*  --   --  0.60*  < > = values in this interval not displayed.  Estimated Creatinine Clearance: 85.9 mL/min (by C-G formula based on SCr of 0.6 mg/dL (L)).    Allergies  Allergen Reactions  . Amikacin Sulfate Other (See Comments)    Seizure   . Cefuroxime Axetil Anaphylaxis    Tolerated cefepime 12/2015 - with Pepcid/Solu-Medrol  . Ertapenem Other (See Comments)    Rash and confusion-->tolerated Imipenem   . Imipenem     Possible cause of seizures  . Morphine And Related Other (See Comments)    Changed mental status, confusion, headache, visual hallucination  . Nsaids Itching and Other (See Comments)    Risk of bleeding, itching  . Penicillins Anaphylaxis and Other (See Comments)    Tolerated Imipenem; no reaction to 7 day course of amoxicillin in 2015 Has patient had a PCN reaction causing immediate rash, facial/tongue/throat swelling, SOB or lightheadedness with hypotension: Yes Has patient had a PCN reaction causing severe rash involving mucus membranes or skin necrosis: No Has patient had a PCN reaction that required hospitalization Yes Has patient had a PCN reaction occurring within the last 10  years: No If all of the above answers are "NO", then may proceed with Cephalo  . Sulfa Antibiotics Anaphylaxis, Shortness Of Breath and Other (See Comments)  . Tessalon [Benzonatate] Anaphylaxis  . Aztreonam Swelling    He appears to tolerate if you premedicate with steroids. Had facial/neck swelling without premedication  . Levaquin [Levofloxacin In D5w] Swelling    Hand and forearm swelling  . Rocephin [Ceftriaxone] Other (See Comments)    Confusion  . Shellfish Allergy Itching and Other (See Comments)    Took benadryl to alleviate reaction  . Miripirium Rash and Other (See Comments)    Change in mental status    Antimicrobials this admission:  Vancomycin 11/20 >>11/21, 11/25 Aztreonam 11/20 >>11/21 (allergy added, swelling of neck 11/21) Tigecycline 11/22 >> 11/24 Linezolid 11/19 >> 11/20; 11/21 >> 11/24 Eraxis 11/24 >>11/26 Imipenem w/ premed 11/24 >> 12/4 Cefepime 12/4>>12/5 Levaquin 12/4>> 12/5 Lvq ordered 12/19-20 before, but refused d/t allergic Cefepime + Levaquin are compatibile Septra 12/5>> Merrem 12/5>>  Microbiology results:  11/19 BCx x 3: Viridans strep - S ceftriaxone, eryth, levo, PCN, vanc 11/19 UCx (suprapub cath): >100k col yeast; >100k col E.faecalis; 20k col acinetobacter calcoaceticus/baumannii (known to be colonized per ID) 11/20 MRSA PCR: positive 11/22 Sacral tissue culture: Kleb Pneumo (S-Imipenem) and E faecalis (S-ampicillin)  11/23 BCx: ngF 11/23 TA: Kleb Pneumo (S-cipro, imipenem) and acinetobacter (S-Unasyn, imipenem, gentamicin) 11/24 TA: Acinetobacter calcoaceticus/baumannii complex 11/30 TA - Abundant  Pseudomonas - sensitive to Cefepime and Ceftazidime; resistant to Imipenem, Zosyn and Gentamicin   and abundant Stenotrophomonas - sensitive to Levaquin and Septra   Eudelia Bunch, Pharm.D. QP:[id number] 09/11/2016 11:58 AM

## 2016-09-11 NOTE — Progress Notes (Signed)
EEG leads glued to patients head with collodion, no skin breakdown seen when gluing. Impedances all under 5k ohms

## 2016-09-11 NOTE — Progress Notes (Signed)
Pharmacy Antibiotic Note  Jason [name] is a 30 y.o. male admitted on 123456 with very complicated ID hx.    Alerted from nursing that family is concerned with patient receiving meropenem with history of altered mental status and hallucinations. Patient received 1 dose of meropenem. Called Dr. Megan Salon on call for ID, explained situation. Received orders to change back to cefepime for now and have Dr.Hatcher re-evaluate in the morning.   Plan: D/c merrem with concern for AMS Restart cefepime with dex Septra 240 mg IV q8h ( ~ 5 mg/kg/dose) Plan for 5 days of abx per ID note  Height: 5\' 8"  (172.7 cm) Weight: 101 lb (45.8 kg) IBW/kg (Calculated) : 68.4  Temp (24hrs), Avg:98.5 F (36.9 C), Min:97.5 F (36.4 C), Max:101.1 F (38.4 C)   Recent Labs Lab 09/05/16 0500  09/07/16 0342 09/08/16 0653 09/09/16 0310 09/10/16 0550 09/11/16 0500  WBC 21.1*  < > 15.0* 16.3* 16.7* 14.4* 15.0*  CREATININE 1.44*  --  0.70 0.60*  --   --  0.60*  < > = values in this interval not displayed.  Estimated Creatinine Clearance: 85.9 mL/min (by C-G formula based on SCr of 0.6 mg/dL (L)).    Allergies  Allergen Reactions  . Amikacin Sulfate Other (See Comments)    Seizure   . Cefuroxime Axetil Anaphylaxis    Tolerated cefepime 12/2015 - with Pepcid/Solu-Medrol  . Ertapenem Other (See Comments)    Rash and confusion-->tolerated Imipenem   . Imipenem     Possible cause of seizures  . Meropenem Other (See Comments)    Altered mental status, hallucinations  . Morphine And Related Other (See Comments)    Changed mental status, confusion, headache, visual hallucination  . Nsaids Itching and Other (See Comments)    Risk of bleeding, itching  . Penicillins Anaphylaxis and Other (See Comments)    Tolerated Imipenem; no reaction to 7 day course of amoxicillin in 2015 Has patient had a PCN reaction causing immediate rash, facial/tongue/throat swelling, SOB or lightheadedness with hypotension:  Yes Has patient had a PCN reaction causing severe rash involving mucus membranes or skin necrosis: No Has patient had a PCN reaction that required hospitalization Yes Has patient had a PCN reaction occurring within the last 10 years: No If all of the above answers are "NO", then may proceed with Cephalo  . Sulfa Antibiotics Anaphylaxis, Shortness Of Breath and Other (See Comments)  . Tessalon [Benzonatate] Anaphylaxis  . Aztreonam Swelling    He appears to tolerate if you premedicate with steroids. Had facial/neck swelling without premedication  . Levaquin [Levofloxacin In D5w] Swelling    Hand and forearm swelling  . Rocephin [Ceftriaxone] Other (See Comments)    Confusion  . Shellfish Allergy Itching and Other (See Comments)    Took benadryl to alleviate reaction  . Miripirium Rash and Other (See Comments)    Change in mental status    Antimicrobials this admission:  Vancomycin 11/20 >>11/21, 11/25 Aztreonam 11/20 >>11/21 (allergy added, swelling of neck 11/21) Tigecycline 11/22 >> 11/24 Linezolid 11/19 >> 11/20; 11/21 >> 11/24 Eraxis 11/24 >>11/26 Imipenem w/ premed 11/24 >> 12/4 Cefepime 12/4>>12/5 Levaquin 12/4>> 12/5 Lvq ordered 12/19-20 before, but refused d/t allergic Cefepime + Levaquin are compatibile Septra 12/5>> Merrem 12/5>>  Microbiology results:  11/19 BCx x 3: Viridans strep - S ceftriaxone, eryth, levo, PCN, vanc 11/19 UCx (suprapub cath): >100k col yeast; >100k col E.faecalis; 20k col acinetobacter calcoaceticus/baumannii (known to be colonized per ID) 11/20 MRSA PCR: positive 11/22  Sacral tissue culture: Kleb Pneumo (S-Imipenem) and E faecalis (S-ampicillin)  11/23 BCx: ngF 11/23 TA: Kleb Pneumo (S-cipro, imipenem) and acinetobacter (S-Unasyn, imipenem, gentamicin) 11/24 TA: Acinetobacter calcoaceticus/baumannii complex 11/30 TA - Abundant Pseudomonas - sensitive to Cefepime and Ceftazidime; resistant to Imipenem, Zosyn and Gentamicin   and abundant  Stenotrophomonas - sensitive to Levaquin and Septra   Erin Hearing PharmD., BCPS Clinical Pharmacist Pager [phone] 09/11/2016 6:40 PM

## 2016-09-11 NOTE — Progress Notes (Signed)
Name: Jason [name] MRN: [id number] DOB: 1986-03-22    ADMISSION DATE:  08/20/2016 CONSULTATION DATE:  11/21  REFERRING MD :  [email or web address] ([facility])   CHIEF COMPLAINT:  Lethargy, vent mgmt   BRIEF PATIENT DESCRIPTION: 30 y.o. quadriplegic r/t C6 spinal cord injury, chronic trach, chronic infections in setting of open wounds sacrum and foot. Started on Azactam without premedication and he has become lethargic and has mild swelling around trach without compromise. Solumedrol has been started and he is HD stable. Mother reports this is his usual reaction to Azactam. PCCM saw 11/21 and signed off.  Called back 11/23 for worsening lethargy and request for vent support.    SIGNIFICANT EVENTS: 11/22 - surgical bx sacral bone/wound 12/04 - transfer to ICU with status epilepticus  STUDIES:  CT Head w/o 11/21:  Mild atrophy.  No acute intracranial abnormality. Mucosal thickening within the sphenoid sinus. No air-fluid level is seen.  ANTIBIOTICS:  Per ID Tygacil 11/21 - 11/24 Vancomycin 11/19 - 11/25 Eraxis 11/24 - 11/26 Aztreonam 11/19 - 11/21 Levaquin 11/19 - 11/20 Zyvox 11/19 - 11/24 Imipenem 12/2 >>   MICROBIOLOGY:  Blood Ctx x3 11/19:  3/3 Streptococcus constellatus  Urine Ctx 11/19:  Acinetobacter & Enterococcus faecalis MRSA PCR 11/20:  Positive Sacral Bone Ctx 11/22:  ESBL Kleb pna, Enterococcus faecalis, strep constellatus Blood Ctx x2 11/23:  Negative  Tracheal Asp Ctx 11/23:  Klebsiella pneumoniae & Acinetobacter Tracheal Asp Ctx 11/24:  Klebsiella pneumoniae & Acinetobacter Tracheal Asp Ctx 11/30:  Pseudomonas aeruginosa & Stenotrophomonas   SUBJECTIVE:   Burst ceeg   VITAL SIGNS: Temp:  [97.5 F (36.4 C)-103.9 F (39.9 C)] 97.5 F (36.4 C) (12/05 0744) Pulse Rate:  [65-120] 65 (12/05 0744) Resp:  [25-29] 25 (12/05 0744) BP: (96-168)/(54-110) 101/62 (12/05 0744) SpO2:  [96 %-100 %] 100 % (12/05 0744) FiO2 (%):  [30 %-100 %] 30 % (12/05 0744) Weight:  [45.8 kg  (101 lb)] 45.8 kg (101 lb) (12/05 0400)  PHYSICAL EXAMINATION: General: not responsive Integument:  Warm & dry. No rash on exposed skin.  HEENT: Tracheostomy clean Cardiovascular:  s1 s2 Regular rhythm. No edema Pulmonary:  coarse Abdomen: Soft. Normal bowel sounds. Nondistended.  Neurological: no movement, no myoclonus, per none reactive   Recent Labs Lab 09/07/16 0342 09/08/16 0653 09/11/16 0500  NA 148* 148* 135  K 2.8* 3.9 3.4*  CL 113* 115* 104  CO2 23 27 18*  BUN 22* 21* 18  CREATININE 0.70 0.60* 0.60*  GLUCOSE 393* 192* 183*    Recent Labs Lab 09/09/16 0310 09/10/16 0550 09/11/16 0500  HGB 7.4* 7.3* 8.0*  HCT 22.7* 22.4* 23.5*  WBC 16.7* 14.4* 15.0*  PLT 178 224 349   No results found.  ASSESSMENT / PLAN:  NEUROLOGICAL A: Status Epilepticus Acute Encephalopathy - Secondary to seizures. 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P: Neurology d/w AEDs Per Neurology:  Keppra, Dilantin, Versed gtt to 30 as goal Sedation:  As per Neurology Continuous EEG Baclofen qid Fentanyl IV prn Pain  INFECTIOUS DISEASES A: Sepsis HCAP - Multiple Organisms Recurrent UTI Acute Osteomyelitis - Sacrum. Intra-Muscular Abscesses - R psoas, iliopsoas & iliacus.   P: To meropenem, per ID Limited options  PULMONARY A: Respiratory Alkalosis Chronic Hypoxic Respiratory Failure - S/P Tracheostomy. HCAP  P: Full Vent Support NO SBT, NO WUA Last ABG reviewed, reduce rate further and repeat abg  RENAL A: Hypokalemia hypomag Chronic Suprapubic Catheter H/O Neurogenic Bladder  P: k supp bmet in am  Mag supp Continuing Ditropan  CARDIOVASCULAR A: Acute on Chronic Diastolic CHF Acute DVT R Subclavian & Brachial Veins - Noted 11/27 & likely due to PICC that was removed 11/22. Chronic Hypotension  P: Continuous telemetry monitoring Vitals per unit protocol Goal MAP >65 Neo-Synephrine to maintain MAP Continuing Midodrine TID  GASTROINTESTINAL A: Severe Protein-Calorie  Malnutrition H/O Gastroparesis - Due to DM. Lower GI Bleed  P: NPO Start feeds via peg Protonix VT daily Holding off on Colonoscopy  HEMATOLOGY/ONCOLOGY A: Leukocytosis - Likely due to sepsis. Improving. Anemia - No signs of active bleeding. Known anemia of chronic disease. H/O PE - S/P IVC Filter.  P: Trending cell counts daily w/ CBC, may need lasix soon SCDs  ENDOCRINE A: H/O DM Type 1 - Glucose erratic   P: Lantus 12u [place] qhs,. reulctant to increase until tf started with low 130 Changing Accu-Checks to q4hr SSI per Moderate Algorithm   FAMILY UPDATE:  Mother updated at bedside by Dr. Ashok Cordia & Dr. Leonel Ramsay on 12/4.   Ccm time 35 min   Lavon Paganini. Titus Mould, MD, [place] Pgr: [facility]

## 2016-09-12 ENCOUNTER — Inpatient Hospital Stay (HOSPITAL_COMMUNITY): Payer: Medicaid Other

## 2016-09-12 DIAGNOSIS — I82621 Acute embolism and thrombosis of deep veins of right upper extremity: Secondary | ICD-10-CM

## 2016-09-12 DIAGNOSIS — G9340 Encephalopathy, unspecified: Secondary | ICD-10-CM

## 2016-09-12 LAB — GLUCOSE, CAPILLARY
GLUCOSE-CAPILLARY: 188 mg/dL — AB (ref 65–99)
GLUCOSE-CAPILLARY: 209 mg/dL — AB (ref 65–99)
GLUCOSE-CAPILLARY: 209 mg/dL — AB (ref 65–99)
GLUCOSE-CAPILLARY: 238 mg/dL — AB (ref 65–99)
Glucose-Capillary: 151 mg/dL — ABNORMAL HIGH (ref 65–99)
Glucose-Capillary: 237 mg/dL — ABNORMAL HIGH (ref 65–99)

## 2016-09-12 LAB — CBC WITH DIFFERENTIAL/PLATELET
Basophils Absolute: 0 10*3/uL (ref 0.0–0.1)
Basophils Relative: 0 %
EOS PCT: 0 %
Eosinophils Absolute: 0 10*3/uL (ref 0.0–0.7)
HEMATOCRIT: 22.8 % — AB (ref 39.0–52.0)
HEMOGLOBIN: 7.6 g/dL — AB (ref 13.0–17.0)
LYMPHS ABS: 2.8 10*3/uL (ref 0.7–4.0)
Lymphocytes Relative: 17 %
MCH: 26.4 pg (ref 26.0–34.0)
MCHC: 33.3 g/dL (ref 30.0–36.0)
MCV: 79.2 fL (ref 78.0–100.0)
MONOS PCT: 6 %
Monocytes Absolute: 1 10*3/uL (ref 0.1–1.0)
NEUTROS ABS: 12.8 10*3/uL — AB (ref 1.7–7.7)
Neutrophils Relative %: 77 %
Platelets: 380 10*3/uL (ref 150–400)
RBC: 2.88 MIL/uL — AB (ref 4.22–5.81)
RDW: 17.2 % — AB (ref 11.5–15.5)
WBC: 16.6 10*3/uL — AB (ref 4.0–10.5)

## 2016-09-12 LAB — COMPREHENSIVE METABOLIC PANEL
ALBUMIN: 1.3 g/dL — AB (ref 3.5–5.0)
ALK PHOS: 181 U/L — AB (ref 38–126)
ALT: 8 U/L — AB (ref 17–63)
ANION GAP: 10 (ref 5–15)
AST: 14 U/L — ABNORMAL LOW (ref 15–41)
BILIRUBIN TOTAL: 0.5 mg/dL (ref 0.3–1.2)
BUN: 19 mg/dL (ref 6–20)
CALCIUM: 7.2 mg/dL — AB (ref 8.9–10.3)
CO2: 18 mmol/L — ABNORMAL LOW (ref 22–32)
CREATININE: 0.64 mg/dL (ref 0.61–1.24)
Chloride: 108 mmol/L (ref 101–111)
GFR calc Af Amer: >60 mL/min (ref >60)
GFR calc non Af Amer: >60 mL/min (ref >60)
GLUCOSE: 180 mg/dL — AB (ref 65–99)
Potassium: 3.5 mmol/L (ref 3.5–5.1)
Sodium: 136 mmol/L (ref 135–145)
TOTAL PROTEIN: 4.5 g/dL — AB (ref 6.5–8.1)

## 2016-09-12 LAB — PHOSPHORUS
PHOSPHORUS: 3.7 mg/dL (ref 2.5–4.6)
PHOSPHORUS: 2.6 mg/dL (ref 2.5–4.6)

## 2016-09-12 LAB — BLOOD GAS, ARTERIAL
ACID-BASE DEFICIT: 6.9 mmol/L — AB (ref 0.0–2.0)
Bicarbonate: 17.6 mmol/L — ABNORMAL LOW (ref 20.0–28.0)
DRAWN BY: 44135
FIO2: 30
MECHVT: 560 mL
O2 Saturation: 95.4 %
PEEP: 5 cmH2O
PO2 ART: 86 mmHg (ref 83.0–108.0)
Patient temperature: 98.6
RATE: 18 resp/min
pCO2 arterial: 32.4 mmHg (ref 32.0–48.0)
pH, Arterial: 7.355 (ref 7.350–7.450)

## 2016-09-12 LAB — MAGNESIUM
MAGNESIUM: 2.1 mg/dL (ref 1.7–2.4)
Magnesium: 2.4 mg/dL (ref 1.7–2.4)

## 2016-09-12 MED ORDER — VALPROATE SODIUM 500 MG/5ML IV SOLN
500.0000 mg | Freq: Three times a day (TID) | INTRAVENOUS | Status: DC
  Administered 2016-09-12 – 2016-09-13 (×5): 500 mg via INTRAVENOUS
  Filled 2016-09-12 (×7): qty 5

## 2016-09-12 MED ORDER — VALPROATE SODIUM 500 MG/5ML IV SOLN
1000.0000 mg | Freq: Once | INTRAVENOUS | Status: AC
  Administered 2016-09-12: 1000 mg via INTRAVENOUS
  Filled 2016-09-12: qty 10

## 2016-09-12 NOTE — Progress Notes (Signed)
Subjective: Intubated and sedated  Exam: Vitals:   09/12/16 1600 09/12/16 1625  BP:  101/73  Pulse:  74  Resp:  18  Temp: 98.6 F (37 C)    Gen: In bed, NAD Resp: non-labored breathing, no acute distress Abd: soft, nt  Neuro: MS: does not open eyes  [id number] fixed, no corneal Motor: no movement to nox stim Sensory:as above   Impression: 30 yo M with new onset refratory status epilepticus, now in burst suppression. There was some concern for a rhythmic pattern during bursts overnight, but I suspect that this is more medication effect rather than seizure.   Exam findings are likely due to induced coma.   Recommendations: 1) Given generalized pattern, I think that depakote may be better than dilantin for seizure control, will change dilantin to depakote 2) continue keppra 1500mg  BID 3) will wean propofol today, likely then wean midazolam tomorrow.   This patient is critically ill and at significant risk of neurological worsening, death and care requires constant monitoring of vital signs, hemodynamics,respiratory and cardiac monitoring, neurological assessment, discussion with family, other specialists and medical decision making of high complexity. I spent 35 minutes of neurocritical care time  in the care of  this patient.  Roland Rack, MD [facility] [phone]  If 7pm- 7am, please page neurology on call as listed in [place]. 09/12/2016  5:29 PM

## 2016-09-12 NOTE — Progress Notes (Signed)
OT Cancellation Note  Patient Details Name: Jason [name] MRN: [id number] DOB: May 29, 1986   Cancelled Treatment:    Reason Eval/Treat Not Completed: Medical issues which prohibited therapy.  Pt with seizures, sedated and undergoing continuous EEG.  [place], OTR/L [id number]   Lucille Passy M 09/12/2016, 2:49 PM

## 2016-09-12 NOTE — Progress Notes (Signed)
FP1, FP2, Ground, EKG electrodes moved back 1 cm due to skin break down. Electrodes all under 5k ohms.

## 2016-09-12 NOTE — Procedures (Signed)
History: 30 yo M with status epilepticus, now in burst suppression.   Sedation: Propofol, versed  Technique: This is a 21 channel routine scalp EEG performed at the bedside with bipolar and monopolar montages arranged in accordance to the international 10/20 system of electrode placement. One channel was dedicated to EKG recording.    Background: At the onset of recording, there was a burst suppression pattern with the bursts consisting of a brief(< 1 second) theta or delta discharge. The interburst interval was form 40 - 50 seconds in general, occasionally longer. Over the course of the recording, the interburst interval gradually decreases to about 10 seconds,  This pattern continues until around midnight at which point the interburst interval decreases to around 2 - 3 seconds and the bursts are more prolonged lasting 0.5 - 1.5 seconds. These consist often of centrally predominant  6- 8Hz  activity resembling sleep spindles without evolution. This pattern continued until replaced with a deeper burst suppression pattern around   Photic stimulation: Physiologic driving is not performed  EEG Abnormalities: 1) Burst suppression pattern  Clinical Interpretation: This EEG is consistent with a profound generalized cerebral dysfunction as can be seen with medication induced coma. There were no seizures recorded during this study.   Roland Rack, MD [facility] [phone]  If 7pm- 7am, please page neurology on call as listed in [place].

## 2016-09-12 NOTE — Progress Notes (Signed)
S/O: EEG reevaluated. There has been recrudescence of intermittent electrographic seizure activity since propofol was decreased to 40 mcg/kg/min.   Exam:   BP 101/61   Pulse 84   Temp 97.4 F (36.3 C) (Oral)   Resp 18   Ht 5\' 8"  (1.727 m)   Wt 45.8 kg (101 lb)   SpO2 100%   BMI 15.36 kg/m    Noncyanotic, nondiaphoretic, sedated. No adventitious movements, twitching or jerking noted.   A/P: Increasing propofol back to 60 mcg/kg/min. Will reevaluate EEG to determine if additional changes are indicated.  Electronically signed: Dr. Kerney Elbe

## 2016-09-12 NOTE — Progress Notes (Signed)
EEG improved after increasing propofol back to 60 mcg/kg/min. Now in burst suppression pattern. Bursts currently are more frequent than they were at 7 PM yesterday, just prior to decreasing propofol from 60 to 40 mcg/kg/min.   Electronically signed: Dr. Kerney Elbe

## 2016-09-12 NOTE — Progress Notes (Signed)
INFECTIOUS DISEASE PROGRESS NOTE  ID: Jason [name] is a 30 y.o. 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 ([place])   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([place])   History of pulmonary embolism   DVT (deep venous thrombosis) ([name])   Severe protein-calorie malnutrition ([place])   Dysphagia, pharyngoesophageal phase   Decubitus ulcer of right perineal ischial region, stage 4 ([place])   Decubitus ulcer of left perineal ischial region, stage 4 ([place])   Sacral decubitus ulcer, stage IV ([name])   Multiple allergies   Recurrent UTI   Respiratory failure, chronic ([place])   Aspiration pneumonia of right lower lobe ([name])   Seizures ([name])   Anxiety state   Anemia of chronic disease   Hyponatremia   Encephalopathy   Other acute osteomyelitis, multiple sites ([place])   Lower GI bleed   Viridans streptococci infection   Coma ([place])   Septic shock ([name])   Anaphylactic syndrome   Acute hypoxemic respiratory failure ([place])   Enterococcal infection   Thrombocytopenia ([place])   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[place])   Chronic respiratory failure with hypoxia ([name])  Subjective: No response  Abtx:  Anti-infectives    Start     Dose/Rate Route Frequency Ordered Stop   09/11/16 2200  ceFEPIme (MAXIPIME) 2 g in dextrose 5 % 50 mL IVPB  Status:  Discontinued     2 g 100 mL/hr over 30 Minutes Intravenous Every 8 hours 09/11/16 1830 09/11/16 1838   09/11/16 2200  ceFEPIme (MAXIPIME) 2 g in dextrose 5 % 50 mL IVPB     2 g 100 mL/hr over 30 Minutes Intravenous Every 8 hours 09/11/16 1838     09/11/16 2000  ceFEPIme (MAXIPIME) 2 g in dextrose 5 % 50 mL IVPB  Status:  Discontinued     2 g 100 mL/hr over 30 Minutes Intravenous Every 8 hours 09/11/16 1813 09/11/16 1830   09/11/16 1400  meropenem (MERREM) 1 g in sodium chloride 0.9 % 100 mL IVPB  Status:  Discontinued      1 g 200 mL/hr over 30 Minutes Intravenous Every 8 hours 09/11/16 1150 09/11/16 1813   09/11/16 1400  sulfamethoxazole-trimethoprim (BACTRIM) 240 mg of trimethoprim in dextrose 5 % 250 mL IVPB     240 mg of trimethoprim 265 mL/hr over 60 Minutes Intravenous Every 8 hours 09/11/16 1156     09/10/16 2100  levofloxacin (LEVAQUIN) IVPB 750 mg  Status:  Discontinued     750 mg 100 mL/hr over 90 Minutes Intravenous Every 24 hours 09/10/16 1849 09/11/16 1115   09/10/16 2000  ceFEPIme (MAXIPIME) 2 g in dextrose 5 % 50 mL IVPB  Status:  Discontinued     2 g 100 mL/hr over 30 Minutes Intravenous Every 8 hours 09/10/16 1847 09/11/16 1140   09/08/16 1700  imipenem-cilastatin (PRIMAXIN) 1,000 mg in sodium chloride 0.9 % 250 mL IVPB  Status:  Discontinued     1,000 mg 250 mL/hr over 60 Minutes Intravenous Every 8 hours 09/08/16 1159 09/10/16 1736   09/01/16 1300  anidulafungin (ERAXIS) 100 mg in sodium chloride 0.9 % 100 mL IVPB  Status:  Discontinued     100 mg over 90 Minutes Intravenous Every 24 hours 08/31/16 1240 09/02/16 1252   09/01/16 1200  vancomycin (VANCOCIN) IVPB 750 mg/150 ml premix  Status:  Discontinued  750 mg 150 mL/hr over 60 Minutes Intravenous Every 24 hours 09/01/16 1145 09/01/16 1712   08/31/16 1700  imipenem-cilastatin (PRIMAXIN) 500 mg in sodium chloride 0.9 % 100 mL IVPB  Status:  Discontinued     500 mg 200 mL/hr over 30 Minutes Intravenous Every 8 hours 08/31/16 1240 09/08/16 1159   08/31/16 1300  anidulafungin (ERAXIS) 200 mg in sodium chloride 0.9 % 200 mL IVPB     200 mg over 180 Minutes Intravenous  Once 08/31/16 1240 08/31/16 1810   08/18/2016 0557  tigecycline (TYGACIL) 50 mg in sodium chloride 0.9 % 100 mL IVPB  Status:  Discontinued     50 mg 200 mL/hr over 30 Minutes Intravenous Every 12 hours 08/28/16 1758 08/31/16 1151   08/28/16 1800  tigecycline (TYGACIL) 100 mg in sodium chloride 0.9 % 100 mL IVPB  Status:  Discontinued     100 mg 200 mL/hr over 30  Minutes Intravenous  Once 08/28/16 1758 08/31/16 1424   08/28/16 1300  linezolid (ZYVOX) IVPB 600 mg  Status:  Discontinued     600 mg 300 mL/hr over 60 Minutes Intravenous Every 12 hours 08/28/16 1239 08/31/16 2014   08/28/16 0200  aztreonam (AZACTAM) 1 g in dextrose 5 % 50 mL IVPB  Status:  Discontinued     1 g 100 mL/hr over 30 Minutes Intravenous Every 8 hours 08/27/16 1802 08/28/16 1232   08/27/16 2000  vancomycin (VANCOCIN) 500 mg in sodium chloride 0.9 % 100 mL IVPB  Status:  Discontinued     500 mg 100 mL/hr over 60 Minutes Intravenous Every 12 hours 08/27/16 1802 08/28/16 1239   08/27/16 1800  aztreonam (AZACTAM) 2 g in dextrose 5 % 50 mL IVPB     2 g 100 mL/hr over 30 Minutes Intravenous  Once 08/27/16 1634 08/27/16 1852   08/27/16 1600  levofloxacin (LEVAQUIN) IVPB 750 mg  Status:  Discontinued     750 mg 100 mL/hr over 90 Minutes Intravenous Every 24 hours 08/27/2016 1324 08/08/2016 1522   08/27/16 0000  vancomycin (VANCOCIN) 500 mg in sodium chloride 0.9 % 100 mL IVPB  Status:  Discontinued     500 mg 100 mL/hr over 60 Minutes Intravenous Every 12 hours 09/06/2016 1324 08/12/2016 1522   09/02/2016 2200  aztreonam (AZACTAM) 1 g in dextrose 5 % 50 mL IVPB  Status:  Discontinued     1 g 100 mL/hr over 30 Minutes Intravenous Every 8 hours 09/02/2016 1324 08/10/2016 1522   08/18/2016 1600  linezolid (ZYVOX) tablet 600 mg  Status:  Discontinued     600 mg Oral Every 12 hours 08/14/2016 1458 08/27/16 1347   09/05/2016 1300  levofloxacin (LEVAQUIN) IVPB 750 mg  Status:  Discontinued     750 mg 100 mL/hr over 90 Minutes Intravenous  Once 08/31/2016 1246 08/31/2016 1522   09/03/2016 1300  aztreonam (AZACTAM) 2 g in dextrose 5 % 50 mL IVPB  Status:  Discontinued     2 g 100 mL/hr over 30 Minutes Intravenous  Once 08/09/2016 1246 08/11/2016 1522   08/19/2016 1300  vancomycin (VANCOCIN) IVPB 1000 mg/200 mL premix  Status:  Discontinued     1,000 mg 200 mL/hr over 60 Minutes Intravenous  Once 08/17/2016 1246 09/05/2016  1522      Medications:  Scheduled: . artificial tears   Both Eyes Q8H  . baclofen  10 mg Oral QID  . budesonide (PULMICORT) nebulizer solution  0.5 mg Nebulization BID  . ceFEPime (MAXIPIME) IV  2 g Intravenous Q8H   And  . dexamethasone  4 mg Intravenous Q8H  . chlorhexidine gluconate (MEDLINE KIT)  15 mL Mouth Rinse BID  . insulin aspart  2-6 Units Subcutaneous Q4H  . insulin glargine  12 Units Subcutaneous QHS  . ipratropium  0.5 mg Nebulization BID  . levETIRAcetam  1,500 mg Intravenous Q12H  . mouth rinse  15 mL Mouth Rinse 10 times per day  . midazolam  40 mg Intravenous Once  . midodrine  20 mg Per Tube TID WC  . oxybutynin  5 mg Per Tube BID  . pantoprazole sodium  40 mg Per Tube Daily  . scopolamine  1 patch Transdermal Q72H  . sulfamethoxazole-trimethoprim  240 mg of trimethoprim Intravenous Q8H  . valproate sodium  500 mg Intravenous Q8H    Objective: Vital signs in last 24 hours: Temp:  [92.2 F (33.4 C)-100.3 F (37.9 C)] 98.4 F (36.9 C) (12/06 0800) Pulse Rate:  [53-100] 82 (12/06 0845) Resp:  [10-25] 18 (12/06 0845) BP: (69-113)/(46-76) 107/67 (12/06 0845) SpO2:  [100 %] 100 % (12/06 0845) FiO2 (%):  [30 %] 30 % (12/06 0742) Weight:  [48.1 kg (106 lb)] 48.1 kg (106 lb) (12/06 0500)   General appearance: no distress Resp: diminished breath sounds anterior - bilateral Cardio: regular rate and rhythm GI: abnormal findings:  absent bowel sounds Extremities: RUE swollen  Lab Results  Recent Labs  09/11/16 0500 09/12/16 0331  WBC 15.0* 16.6*  HGB 8.0* 7.6*  HCT 23.5* 22.8*  NA 135 136  K 3.4* 3.5  CL 104 108  CO2 18* 18*  BUN 18 19  CREATININE 0.60* 0.64   Liver Panel  Recent Labs  09/11/16 0500 09/12/16 0331  PROT  --  4.5*  ALBUMIN 1.5* 1.3*  AST  --  14*  ALT  --  8*  ALKPHOS  --  181*  BILITOT  --  0.5   Sedimentation Rate No results for input(s): ESRSEDRATE in the last 72 hours. C-Reactive Protein No results for input(s):  CRP in the last 72 hours.  Microbiology: Recent Results (from the past 240 hour(s))  Culture, respiratory (NON-Expectorated)     Status: None   Collection Time: 09/06/16 10:36 PM  Result Value Ref Range Status   Specimen Description TRACHEAL ASPIRATE  Final   Special Requests Normal  Final   Gram Stain   Final    MODERATE WBC PRESENT,BOTH PMN AND MONONUCLEAR FEW GRAM NEGATIVE RODS    Culture   Final    ABUNDANT PSEUDOMONAS AERUGINOSA ABUNDANT STENOTROPHOMONAS MALTOPHILIA THE PSEUDOMONAS AERUGINOSA IS A  MULTI-DRUG RESISTANT ORGANISM Results Called to: RN N.[place]  [id number] MLM    Report Status 09/10/2016 FINAL  Final   Organism ID, Bacteria STENOTROPHOMONAS MALTOPHILIA  Final   Organism ID, Bacteria PSEUDOMONAS AERUGINOSA  Final      Susceptibility   Pseudomonas aeruginosa - MIC*    CEFTAZIDIME 8 SENSITIVE Sensitive     CIPROFLOXACIN >=4 RESISTANT Resistant     GENTAMICIN >=16 RESISTANT Resistant     IMIPENEM >=16 RESISTANT Resistant     PIP/TAZO >=128 RESISTANT Resistant     CEFEPIME 8 SENSITIVE Sensitive     * ABUNDANT PSEUDOMONAS AERUGINOSA   Stenotrophomonas maltophilia - MIC*    LEVOFLOXACIN 1 SENSITIVE Sensitive     TRIMETH/SULFA <=20 SENSITIVE Sensitive     * ABUNDANT STENOTROPHOMONAS MALTOPHILIA    Studies/Results: Ct Head Wo Contrast  Result Date: 09/11/2016 CLINICAL DATA:  29 year old Quadriplegic male  with seizure activity yesterday and fever of unknown origin. Initial encounter. EXAM: CT HEAD WITHOUT CONTRAST TECHNIQUE: Contiguous axial images were obtained from the base of the skull through the vertex without intravenous contrast. COMPARISON:  Head CTs 08/28/2016 and earlier.  Brain MRI 08/17/2014. FINDINGS: Brain: Stable cerebral volume. No midline shift, ventriculomegaly, mass effect, evidence of mass lesion, intracranial hemorrhage or evidence of cortically based acute infarction. Gray-white matter differentiation is within normal limits throughout the  brain. No cortical encephalomalacia identified. Vascular: No suspicious intracranial vascular hyperdensity. Skull: No acute osseous abnormality identified. Sinuses/Orbits: Visualized paranasal sinuses and mastoids are stable and well pneumatized. There is mild mucosal thickening in the sphenoid sinuses which is unchanged since November. Tympanic cavities and mastoids remain clear. Other: Negative orbit and scalp soft tissues. IMPRESSION: Stable and negative noncontrast CT appearance of the brain. Electronically Signed   By: Genevie Ann M.D.   On: 09/11/2016 16:36   Ct Abdomen Pelvis W Contrast  Result Date: 09/11/2016 CLINICAL DATA:  30 year old Quadriplegic male with fever of unknown origin. Seizure like activity since yesterday. Initial encounter. EXAM: CT ABDOMEN AND PELVIS WITH CONTRAST TECHNIQUE: Multidetector CT imaging of the abdomen and pelvis was performed using the standard protocol following bolus administration of intravenous contrast. CONTRAST:  69m ISOVUE-300 IOPAMIDOL (ISOVUE-300) INJECTION 61% COMPARISON:  CT Abdomen and Pelvis 02/24/2016 and earlier. FINDINGS: Lower chest: Improved ventilation in the left lower lobe since 08/14/2016, but continued consolidation and increased peribronchial nodularity in the right lower lobe. Small layering right pleural effusion persists. Interval increased patchy and peribronchial opacity in the visible right middle lobe (series 205, image 3). No pericardial effusion.  No left pleural effusion. Hepatobiliary: Negative liver. Dependent sludge or vicarious contrast excretion in the gallbladder. The gallbladder is nondilated. No biliary ductal enlargement is evident. Pancreas: Negative. Spleen: Diminutive common negative. Adrenals/Urinary Tract: Normal adrenal glands. Both kidneys appear stable and within normal limits. Chronic suprapubic catheter. Generalized urinary bladder wall thickening. Small volume gas within the non dependent portion of the bladder. Incidental  pelvic phleboliths. Stomach/Bowel: Stable rectum, with mild perirectal fat stranding contiguous with the decubitus ulcers. Decompressed rectosigmoid colon. Left lower quadrant double barrel large bowel ostomy. Oral contrast has reached the ostomy and is within the descending colon. Gas distended transverse colon. Mildly contrast distended right colon. Evidence of a normal appendix containing contrast (series 21, image 55). No dilated small bowel. Gastrostomy tube in place with no adverse features. Vascular/Lymphatic: Suboptimal intravascular contrast bolus due to hand injection. Major arterial structures appear patent. IVC filter remains in place. No portal venous contrast at the time of imaging. No lymphadenopathy identified. Reproductive: Negative. Other: No pelvic free fluid. Small volume bilateral upper abdominal free fluid. Small to moderate volume of free fluid in the right mid abdomen about the right colon. No pneumoperitoneum. Regressed right paraspinal muscle gas at the L4 level since November suggesting regressed intramuscular abscess. Continued abnormal gas in the right SI joint. See skeletal findings below. Musculoskeletal: Moderate to severe heterogeneity of the T8, L1, L2 and L4 vertebral bodies with areas of bone erosion, nonspecific but compatible with sequelae of osteomyelitis. Superimposed mild compression fractures of T10, L3, and L5 could be pathologic fractures. Sequelae of sacral resection beginning at the distal S2 level. Widespread overlying decubitus ulcers at the sacrum and pelvis. Sequelae of septic right SI joint. Sequelae of chronic ischial tuberosity osteomyelitis. Overall Stable visualized osseous structures since the prior CT. IMPRESSION: 1. Improved left lower lobe pneumonia since November but continued Right Lower  Lobe Pneumonia with increased superior segment peribronchial opacity, an new abnormal right middle lobe opacity suspicious for progression of right lung infection. Stable  small layering right pleural effusion. 2. New small to moderate volume free fluid/ascites in the abdomen is nonspecific. 3. Otherwise no focal intra-abdominal or intra-pelvic inflammatory process identified. 4. 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 Associated right lower lumbar paraspinal muscle abscess probably is regressed since November. Sequelae of multilevel spinal osteomyelitis suspected and stable since November. 5. Chronic suprapubic catheter with nonspecific bladder wall thickening. No bowel obstruction. Percutaneous gastrostomy tube with no adverse features. Electronically Signed   By: Genevie Ann M.D.   On: 09/11/2016 16:50   Dg Chest Port 1 View  Result Date: 09/12/2016 CLINICAL DATA:  Tracheostomy EXAM: PORTABLE CHEST 1 VIEW COMPARISON:  09/07/2016 FINDINGS: Tracheostomy tube is unchanged in position. Central mild vascular congestion without convincing pulmonary edema. Improvement in aeration from prior exam. Residual streaky bilateral basilar atelectasis or infiltrate. Right IJ central line is unchanged in position. IMPRESSION: Stable tracheostomy position. Central mild vascular congestion without convincing pulmonary edema. Improvement in aeration from prior exam. Residual streaky bilateral basilar atelectasis or infiltrate. Electronically Signed   By: Lahoma Crocker M.D.   On: 09/12/2016 08:17     Assessment/Plan: C6 Spinal Infarct Quadraplegia Sepsis Streptococcal constellatus bacteremia Sacral Osteomyelitis Bone Bx 11-22 ESBL klebsiella Enterococcus (S- amp) Strep constellatus HCAP, aspiration Chronic Trach CXR worsening vascular congestion.  11-23Acinetobacter, Klebsiella             11-30 stenotrophomonas, pseudomonas Status epiliepticus RUE DVT  Total days of antibiotics: 16 12-4 cefepime, bactrim  Imipenem  12-2 >> 12-4 Tygacil 11/21 -  11/24 Vancomycin 11/19 - 11/25 Eraxis 11/24 - 11/26 Aztreonam 11/19 - 11/21 Levaquin 11/19 - 11/20 Zyvox 11/19 - 11/24  Will watch as he comes off sedation His CXR shows improving aeration.  Afebrile, wbc stable.  Short course of anbx (5 days of cefepime/bactrim)         [name] Infectious Diseases (pager) [phone] [email or web address] 09/12/2016, 9:32 AM  LOS: 17 days

## 2016-09-12 NOTE — Progress Notes (Signed)
Name: Jason [name] MRN: [id number] DOB: 1986/01/16    ADMISSION DATE:  08/15/2016 CONSULTATION DATE:  11/21  REFERRING MD :  [email or web address] ([facility])   CHIEF COMPLAINT:  Lethargy, vent mgmt   BRIEF PATIENT DESCRIPTION: 30 y.o. quadriplegic r/t C6 spinal cord injury, chronic trach, chronic infections in setting of open wounds sacrum and foot. Started on Azactam without premedication and he has become lethargic and has mild swelling around trach without compromise. Solumedrol has been started and he is HD stable. Mother reports this is his usual reaction to Azactam. PCCM saw 11/21 and signed off.  Called back 11/23 for worsening lethargy and request for vent support.    SIGNIFICANT EVENTS: 11/22 - surgical bx sacral bone/wound 12/04 - transfer to ICU with status epilepticus  STUDIES:  CT Head w/o 11/21:  Mild atrophy.  No acute intracranial abnormality. Mucosal thickening within the sphenoid sinus. No air-fluid level is seen. 12/6 eeg >>>burst suppression  ANTIBIOTICS:  Per ID Tygacil 11/21 - 11/24 Vancomycin 11/19 - 11/25 Eraxis 11/24 - 11/26 Aztreonam 11/19 - 11/21 Levaquin 11/19 - 11/20 Zyvox 11/19 - 11/24 Imipenem 12/2 >> off seziures Cefepime 12/5>>> Bactrim 12.5>>>  MICROBIOLOGY:  Blood Ctx x3 11/19:  3/3 Streptococcus constellatus  Urine Ctx 11/19:  Acinetobacter & Enterococcus faecalis MRSA PCR 11/20:  Positive Sacral Bone Ctx 11/22:  ESBL Kleb pna, Enterococcus faecalis, strep constellatus Blood Ctx x2 11/23:  Negative  Tracheal Asp Ctx 11/23:  Klebsiella pneumoniae & Acinetobacter Tracheal Asp Ctx 11/24:  Klebsiella pneumoniae & Acinetobacter Tracheal Asp Ctx 11/30:  Pseudomonas aeruginosa & Stenotrophomonas   SUBJECTIVE:   Deep sedation  VITAL SIGNS: Temp:  [92.2 F (33.4 C)-100.3 F (37.9 C)] 98.4 F (36.9 C) (12/06 0800) Pulse Rate:  [53-100] 82 (12/06 0845) Resp:  [10-25] 18 (12/06 0845) BP: (69-113)/(46-76) 107/67 (12/06 0845) SpO2:  [100 %] 100 %  (12/06 0845) FiO2 (%):  [30 %] 30 % (12/06 0742) Weight:  [48.1 kg (106 lb)] 48.1 kg (106 lb) (12/06 0500)  PHYSICAL EXAMINATION: General: not responsive, deep sedation HEENT: Tracheostomy clean Cardiovascular:  s1 s2 Regular rhythm. No edema Pulmonary:  Coarse top clear Abdomen: Soft. Normal bowel sounds. Nondistended.  Neurological: no movement, no myoclonus, per none reactive   Recent Labs Lab 09/08/16 0653 09/11/16 0500 09/12/16 0331  NA 148* 135 136  K 3.9 3.4* 3.5  CL 115* 104 108  CO2 27 18* 18*  BUN 21* 18 19  CREATININE 0.60* 0.60* 0.64  GLUCOSE 192* 183* 180*    Recent Labs Lab 09/10/16 0550 09/11/16 0500 09/12/16 0331  HGB 7.3* 8.0* 7.6*  HCT 22.4* 23.5* 22.8*  WBC 14.4* 15.0* 16.6*  PLT 224 349 380   Ct Head Wo Contrast  Result Date: 09/11/2016 CLINICAL DATA:  30 year old Quadriplegic male with seizure activity yesterday and fever of unknown origin. Initial encounter. EXAM: CT HEAD WITHOUT CONTRAST TECHNIQUE: Contiguous axial images were obtained from the base of the skull through the vertex without intravenous contrast. COMPARISON:  Head CTs 08/28/2016 and earlier.  Brain MRI 08/17/2014. FINDINGS: Brain: Stable cerebral volume. No midline shift, ventriculomegaly, mass effect, evidence of mass lesion, intracranial hemorrhage or evidence of cortically based acute infarction. Gray-white matter differentiation is within normal limits throughout the brain. No cortical encephalomalacia identified. Vascular: No suspicious intracranial vascular hyperdensity. Skull: No acute osseous abnormality identified. Sinuses/Orbits: Visualized paranasal sinuses and mastoids are stable and well pneumatized. There is mild mucosal thickening in the sphenoid sinuses which is unchanged since November. Tympanic cavities  and mastoids remain clear. Other: Negative orbit and scalp soft tissues. IMPRESSION: Stable and negative noncontrast CT appearance of the brain. Electronically Signed   By:  Genevie Ann M.D.   On: 09/11/2016 16:36   Ct Abdomen Pelvis W Contrast  Result Date: 09/11/2016 CLINICAL DATA:  30 year old Quadriplegic male with fever of unknown origin. Seizure like activity since yesterday. Initial encounter. EXAM: CT ABDOMEN AND PELVIS WITH CONTRAST TECHNIQUE: Multidetector CT imaging of the abdomen and pelvis was performed using the standard protocol following bolus administration of intravenous contrast. CONTRAST:  48mL ISOVUE-300 IOPAMIDOL (ISOVUE-300) INJECTION 61% COMPARISON:  CT Abdomen and Pelvis 02/24/2016 and earlier. FINDINGS: Lower chest: Improved ventilation in the left lower lobe since 09/02/2016, but continued consolidation and increased peribronchial nodularity in the right lower lobe. Small layering right pleural effusion persists. Interval increased patchy and peribronchial opacity in the visible right middle lobe (series 205, image 3). No pericardial effusion.  No left pleural effusion. Hepatobiliary: Negative liver. Dependent sludge or vicarious contrast excretion in the gallbladder. The gallbladder is nondilated. No biliary ductal enlargement is evident. Pancreas: Negative. Spleen: Diminutive common negative. Adrenals/Urinary Tract: Normal adrenal glands. Both kidneys appear stable and within normal limits. Chronic suprapubic catheter. Generalized urinary bladder wall thickening. Small volume gas within the non dependent portion of the bladder. Incidental pelvic phleboliths. Stomach/Bowel: Stable rectum, with mild perirectal fat stranding contiguous with the decubitus ulcers. Decompressed rectosigmoid colon. Left lower quadrant double barrel large bowel ostomy. Oral contrast has reached the ostomy and is within the descending colon. Gas distended transverse colon. Mildly contrast distended right colon. Evidence of a normal appendix containing contrast (series 21, image 55). No dilated small bowel. Gastrostomy tube in place with no adverse features. Vascular/Lymphatic:  Suboptimal intravascular contrast bolus due to hand injection. Major arterial structures appear patent. IVC filter remains in place. No portal venous contrast at the time of imaging. No lymphadenopathy identified. Reproductive: Negative. Other: No pelvic free fluid. Small volume bilateral upper abdominal free fluid. Small to moderate volume of free fluid in the right mid abdomen about the right colon. No pneumoperitoneum. Regressed right paraspinal muscle gas at the L4 level since November suggesting regressed intramuscular abscess. Continued abnormal gas in the right SI joint. See skeletal findings below. Musculoskeletal: Moderate to severe heterogeneity of the T8, L1, L2 and L4 vertebral bodies with areas of bone erosion, nonspecific but compatible with sequelae of osteomyelitis. Superimposed mild compression fractures of T10, L3, and L5 could be pathologic fractures. Sequelae of sacral resection beginning at the distal S2 level. Widespread overlying decubitus ulcers at the sacrum and pelvis. Sequelae of septic right SI joint. Sequelae of chronic ischial tuberosity osteomyelitis. Overall Stable visualized osseous structures since the prior CT. IMPRESSION: 1. Improved left lower lobe pneumonia since November but continued Right Lower Lobe Pneumonia with increased superior segment peribronchial opacity, an new abnormal right middle lobe opacity suspicious for progression of right lung infection. Stable small layering right pleural effusion. 2. New small to moderate volume free fluid/ascites in the abdomen is nonspecific. 3. Otherwise no focal intra-abdominal or intra-pelvic inflammatory process identified. 4. 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 Associated right lower lumbar paraspinal muscle abscess probably is regressed since November. Sequelae of multilevel spinal osteomyelitis suspected and stable since November. 5. Chronic suprapubic catheter with nonspecific  bladder wall thickening. No bowel obstruction. Percutaneous gastrostomy tube with no adverse features. Electronically Signed   By: Genevie Ann M.D.   On: 09/11/2016 16:50  Dg Chest Port 1 View  Result Date: 09/12/2016 CLINICAL DATA:  Tracheostomy EXAM: PORTABLE CHEST 1 VIEW COMPARISON:  09/07/2016 FINDINGS: Tracheostomy tube is unchanged in position. Central mild vascular congestion without convincing pulmonary edema. Improvement in aeration from prior exam. Residual streaky bilateral basilar atelectasis or infiltrate. Right IJ central line is unchanged in position. IMPRESSION: Stable tracheostomy position. Central mild vascular congestion without convincing pulmonary edema. Improvement in aeration from prior exam. Residual streaky bilateral basilar atelectasis or infiltrate. Electronically Signed   By: Lahoma Crocker M.D.   On: 09/12/2016 08:17    ASSESSMENT / PLAN:  NEUROLOGICAL A: Status Epilepticus Acute Encephalopathy - Secondary to seizures. 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P: AEDs Per Neurology:  Keppra, Dilantin, Versed gtt to 30 as goal Sedation:  As per Neurology Continuous EEG remains Baclofen qid Fentanyl IV prn Pain Prop to off is goal today per neuro  INFECTIOUS DISEASES A: Sepsis HCAP - Multiple Organisms Recurrent UTI Acute Osteomyelitis - Sacrum. Intra-Muscular Abscesses - R psoas, iliopsoas & iliacus.   P: Cefepime / bactrim started 12/5  PULMONARY A: Respiratory Alkalosis Chronic Hypoxic Respiratory Failure - S/P Tracheostomy. HCAP  P: Full Vent Support NO SBT ABG reviewed Keep same rate / TV  RENAL A: Hypokalemia hypomag Chronic Suprapubic Catheter H/O Neurogenic Bladder  P: bmet in am   CARDIOVASCULAR A: Acute on Chronic Diastolic CHF Acute DVT R Subclavian & Brachial Veins - Noted 11/27 & likely due to PICC that was removed 11/22. Chronic Hypotension  P: Continuous telemetry monitoring Vitals per unit protocol Goal MAP >60 Neo-Synephrine to maintain MAP-  hope this dc after propofol off Continuing Midodrine TID  GASTROINTESTINAL A: Severe Protein-Calorie Malnutrition H/O Gastroparesis - Due to DM. Lower GI Bleed  P: feeds via peg Protonix VT daily  HEMATOLOGY/ONCOLOGY A: Leukocytosis - Likely due to sepsis. Improving. Anemia - No signs of active bleeding. Known anemia of chronic disease. H/O PE - S/P IVC Filter.  P: Trending cell counts daily w/ CBC, may need lasix in am  Cbc hct in am  SCDs  ENDOCRINE A: H/O DM Type 1 - Glucose erratic  Low 105 P: Lantus 12u Gray qhs,. reulctant to increase low 105 Accu-Checks to q4hr SSI per Moderate Algorithm   FAMILY UPDATE:  none  Ccm time 35 min   Lavon Paganini. Titus Mould, MD, [place] Pgr: [facility]

## 2016-09-13 DIAGNOSIS — E872 Acidosis: Secondary | ICD-10-CM

## 2016-09-13 DIAGNOSIS — J9602 Acute respiratory failure with hypercapnia: Secondary | ICD-10-CM

## 2016-09-13 DIAGNOSIS — R197 Diarrhea, unspecified: Secondary | ICD-10-CM

## 2016-09-13 DIAGNOSIS — J9600 Acute respiratory failure, unspecified whether with hypoxia or hypercapnia: Secondary | ICD-10-CM

## 2016-09-13 LAB — COMPREHENSIVE METABOLIC PANEL
ALK PHOS: 151 U/L — AB (ref 38–126)
ALT: 8 U/L — AB (ref 17–63)
ANION GAP: 7 (ref 5–15)
AST: 14 U/L — ABNORMAL LOW (ref 15–41)
Albumin: 1.2 g/dL — ABNORMAL LOW (ref 3.5–5.0)
BILIRUBIN TOTAL: 0.4 mg/dL (ref 0.3–1.2)
BUN: 19 mg/dL (ref 6–20)
CALCIUM: 6.9 mg/dL — AB (ref 8.9–10.3)
CO2: 19 mmol/L — AB (ref 22–32)
CREATININE: 0.65 mg/dL (ref 0.61–1.24)
Chloride: 110 mmol/L (ref 101–111)
Glucose, Bld: 156 mg/dL — ABNORMAL HIGH (ref 65–99)
Potassium: 3.1 mmol/L — ABNORMAL LOW (ref 3.5–5.1)
SODIUM: 136 mmol/L (ref 135–145)
TOTAL PROTEIN: 4.5 g/dL — AB (ref 6.5–8.1)

## 2016-09-13 LAB — CBC WITH DIFFERENTIAL/PLATELET
BASOS PCT: 0 %
Basophils Absolute: 0 10*3/uL (ref 0.0–0.1)
EOS ABS: 0 10*3/uL (ref 0.0–0.7)
EOS PCT: 0 %
HEMATOCRIT: 20.7 % — AB (ref 39.0–52.0)
Hemoglobin: 6.7 g/dL — CL (ref 13.0–17.0)
LYMPHS ABS: 1.6 10*3/uL (ref 0.7–4.0)
Lymphocytes Relative: 13 %
MCH: 26.2 pg (ref 26.0–34.0)
MCHC: 32.4 g/dL (ref 30.0–36.0)
MCV: 80.9 fL (ref 78.0–100.0)
MONO ABS: 1.9 10*3/uL — AB (ref 0.1–1.0)
Monocytes Relative: 16 %
NEUTROS PCT: 71 %
Neutro Abs: 8.6 10*3/uL — ABNORMAL HIGH (ref 1.7–7.7)
PLATELETS: 329 10*3/uL (ref 150–400)
RBC: 2.56 MIL/uL — ABNORMAL LOW (ref 4.22–5.81)
RDW: 17.5 % — AB (ref 11.5–15.5)
WBC: 12.1 10*3/uL — ABNORMAL HIGH (ref 4.0–10.5)

## 2016-09-13 LAB — GLUCOSE, CAPILLARY
GLUCOSE-CAPILLARY: 119 mg/dL — AB (ref 65–99)
GLUCOSE-CAPILLARY: 152 mg/dL — AB (ref 65–99)
GLUCOSE-CAPILLARY: 176 mg/dL — AB (ref 65–99)
Glucose-Capillary: 190 mg/dL — ABNORMAL HIGH (ref 65–99)
Glucose-Capillary: 215 mg/dL — ABNORMAL HIGH (ref 65–99)
Glucose-Capillary: 176 mg/dL — ABNORMAL HIGH (ref 65–99)

## 2016-09-13 LAB — VALPROIC ACID LEVEL: Valproic Acid Lvl: <10 ug/mL — ABNORMAL LOW (ref 50.0–100.0)

## 2016-09-13 LAB — PHENOBARBITAL LEVEL: Phenobarbital: 17.5 ug/mL (ref 15.0–30.0)

## 2016-09-13 LAB — HEMOGLOBIN AND HEMATOCRIT, BLOOD
HCT: 29 % — ABNORMAL LOW (ref 39.0–52.0)
Hemoglobin: 9.7 g/dL — ABNORMAL LOW (ref 13.0–17.0)

## 2016-09-13 LAB — PREPARE RBC (CROSSMATCH)

## 2016-09-13 LAB — TRIGLYCERIDES: Triglycerides: 525 mg/dL — ABNORMAL HIGH (ref <150)

## 2016-09-13 LAB — MAGNESIUM: Magnesium: 2.1 mg/dL (ref 1.7–2.4)

## 2016-09-13 LAB — PHOSPHORUS: PHOSPHORUS: 2.3 mg/dL — AB (ref 2.5–4.6)

## 2016-09-13 MED ORDER — SODIUM CHLORIDE 0.9 % IV SOLN
Freq: Once | INTRAVENOUS | Status: AC
  Administered 2016-09-13: 08:00:00 via INTRAVENOUS

## 2016-09-13 MED ORDER — VALPROATE SODIUM 500 MG/5ML IV SOLN
750.0000 mg | Freq: Three times a day (TID) | INTRAVENOUS | Status: DC
  Administered 2016-09-14: 750 mg via INTRAVENOUS
  Filled 2016-09-13 (×3): qty 7.5

## 2016-09-13 MED ORDER — INSULIN GLARGINE 100 UNIT/ML ~~LOC~~ SOLN
15.0000 [IU] | Freq: Every day | SUBCUTANEOUS | Status: DC
  Administered 2016-09-13: 15 [IU] via SUBCUTANEOUS
  Filled 2016-09-13 (×3): qty 0.15

## 2016-09-13 MED ORDER — FUROSEMIDE 10 MG/ML IJ SOLN
40.0000 mg | Freq: Once | INTRAMUSCULAR | Status: AC
  Administered 2016-09-13: 40 mg via INTRAVENOUS
  Filled 2016-09-13: qty 4

## 2016-09-13 MED ORDER — POTASSIUM CHLORIDE 20 MEQ/15ML (10%) PO SOLN
40.0000 meq | ORAL | Status: AC
  Administered 2016-09-13 (×2): 40 meq
  Filled 2016-09-13 (×2): qty 30

## 2016-09-13 NOTE — Progress Notes (Signed)
Spoke with mother, Jason [name] on the phone in regards to administration of Bactrim. Although Sulfa antibiotics is listed on his allergies, mother says he has had bactrim previously without reaction. She gave the authorization to proceed with giving the medicine.

## 2016-09-13 NOTE — Progress Notes (Signed)
Subjective: Intubated and sedated  Exam: Vitals:   09/13/16 1600 09/13/16 1700  BP: 106/64 116/73  Pulse: 93 97  Resp: 18 18  Temp: 99.1 F (37.3 C)    Gen: In bed, NAD Resp: non-labored breathing, no acute distress Abd: soft, nt  Neuro: MS: does flinch to noxious stimuli(nasal stimuli) [id number] reactive but sluggish, face symmetric Motor: no movement  Sensory:as above   Impression: 30 yo M with new onset refratory status epilepticus, now in burst suppression. There was some concern for a rhythmic pattern during bursts overnight, but I suspect that this is more medication effect rather than seizure.   \ Recommendations: 1) continue depakotee 2) continue keppra 1500mg  BID 3) will wean versed today  Roland Rack, MD [facility] [phone]  If 7pm- 7am, please page neurology on call as listed in [place]. 09/13/2016  5:28 PM

## 2016-09-13 NOTE — Progress Notes (Signed)
INFECTIOUS DISEASE PROGRESS NOTE  ID: Jason [name] is a 30 y.o. 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 ([place])   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([place])   History of pulmonary embolism   DVT (deep venous thrombosis) ([name])   Severe protein-calorie malnutrition ([place])   Dysphagia, pharyngoesophageal phase   Decubitus ulcer of right perineal ischial region, stage 4 ([name])   Decubitus ulcer of left perineal ischial region, stage 4 ([name])   Sacral decubitus ulcer, stage IV ([name])   Multiple allergies   Recurrent UTI   Respiratory failure, chronic ([name])   Aspiration pneumonia of right lower lobe ([name])   Seizures ([name])   Anxiety state   Anemia of chronic disease   Hyponatremia   Encephalopathy   Other acute osteomyelitis, multiple sites ([place])   Lower GI bleed   Viridans streptococci infection   Coma ([place])   Septic shock ([name])   Anaphylactic syndrome   Acute hypoxemic respiratory failure ([name])   Enterococcal infection   Thrombocytopenia ([place])   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    Abtx:  Anti-infectives    Start     Dose/Rate Route Frequency Ordered Stop   09/11/16 2200  ceFEPIme (MAXIPIME) 2 g in dextrose 5 % 50 mL IVPB  Status:  Discontinued     2 g 100 mL/hr over 30 Minutes Intravenous Every 8 hours 09/11/16 1830 09/11/16 1838   09/11/16 2200  ceFEPIme (MAXIPIME) 2 g in dextrose 5 % 50 mL IVPB     2 g 100 mL/hr over 30 Minutes Intravenous Every 8 hours 09/11/16 1838     09/11/16 2000  ceFEPIme (MAXIPIME) 2 g in dextrose 5 % 50 mL IVPB  Status:  Discontinued     2 g 100 mL/hr over 30 Minutes Intravenous Every 8 hours 09/11/16 1813 09/11/16 1830   09/11/16 1400  meropenem (MERREM) 1 g in sodium chloride 0.9 %  100 mL IVPB  Status:  Discontinued     1 g 200 mL/hr over 30 Minutes Intravenous Every 8 hours 09/11/16 1150 09/11/16 1813   09/11/16 1400  sulfamethoxazole-trimethoprim (BACTRIM) 240 mg of trimethoprim in dextrose 5 % 250 mL IVPB     240 mg of trimethoprim 265 mL/hr over 60 Minutes Intravenous Every 8 hours 09/11/16 1156     09/10/16 2100  levofloxacin (LEVAQUIN) IVPB 750 mg  Status:  Discontinued     750 mg 100 mL/hr over 90 Minutes Intravenous Every 24 hours 09/10/16 1849 09/11/16 1115   09/10/16 2000  ceFEPIme (MAXIPIME) 2 g in dextrose 5 % 50 mL IVPB  Status:  Discontinued     2 g 100 mL/hr over 30 Minutes Intravenous Every 8 hours 09/10/16 1847 09/11/16 1140   09/08/16 1700  imipenem-cilastatin (PRIMAXIN) 1,000 mg in sodium chloride 0.9 % 250 mL IVPB  Status:  Discontinued     1,000 mg 250 mL/hr over 60 Minutes Intravenous Every 8 hours 09/08/16 1159 09/10/16 1736   09/01/16 1300  anidulafungin (ERAXIS) 100 mg in sodium chloride 0.9 % 100 mL IVPB  Status:  Discontinued     100 mg over 90 Minutes Intravenous Every 24 hours 08/31/16 1240 09/02/16 1252   09/01/16 1200  vancomycin (VANCOCIN) IVPB  750 mg/150 ml premix  Status:  Discontinued     750 mg 150 mL/hr over 60 Minutes Intravenous Every 24 hours 09/01/16 1145 09/01/16 1712   08/31/16 1700  imipenem-cilastatin (PRIMAXIN) 500 mg in sodium chloride 0.9 % 100 mL IVPB  Status:  Discontinued     500 mg 200 mL/hr over 30 Minutes Intravenous Every 8 hours 08/31/16 1240 09/08/16 1159   08/31/16 1300  anidulafungin (ERAXIS) 200 mg in sodium chloride 0.9 % 200 mL IVPB     200 mg over 180 Minutes Intravenous  Once 08/31/16 1240 08/31/16 1810   08/25/2016 0557  tigecycline (TYGACIL) 50 mg in sodium chloride 0.9 % 100 mL IVPB  Status:  Discontinued     50 mg 200 mL/hr over 30 Minutes Intravenous Every 12 hours 08/28/16 1758 08/31/16 1151   08/28/16 1800  tigecycline (TYGACIL) 100 mg in sodium chloride 0.9 % 100 mL IVPB  Status:  Discontinued      100 mg 200 mL/hr over 30 Minutes Intravenous  Once 08/28/16 1758 08/31/16 1424   08/28/16 1300  linezolid (ZYVOX) IVPB 600 mg  Status:  Discontinued     600 mg 300 mL/hr over 60 Minutes Intravenous Every 12 hours 08/28/16 1239 08/31/16 2014   08/28/16 0200  aztreonam (AZACTAM) 1 g in dextrose 5 % 50 mL IVPB  Status:  Discontinued     1 g 100 mL/hr over 30 Minutes Intravenous Every 8 hours 08/27/16 1802 08/28/16 1232   08/27/16 2000  vancomycin (VANCOCIN) 500 mg in sodium chloride 0.9 % 100 mL IVPB  Status:  Discontinued     500 mg 100 mL/hr over 60 Minutes Intravenous Every 12 hours 08/27/16 1802 08/28/16 1239   08/27/16 1800  aztreonam (AZACTAM) 2 g in dextrose 5 % 50 mL IVPB     2 g 100 mL/hr over 30 Minutes Intravenous  Once 08/27/16 1634 08/27/16 1852   08/27/16 1600  levofloxacin (LEVAQUIN) IVPB 750 mg  Status:  Discontinued     750 mg 100 mL/hr over 90 Minutes Intravenous Every 24 hours 08/20/2016 1324 08/08/2016 1522   08/27/16 0000  vancomycin (VANCOCIN) 500 mg in sodium chloride 0.9 % 100 mL IVPB  Status:  Discontinued     500 mg 100 mL/hr over 60 Minutes Intravenous Every 12 hours 08/11/2016 1324 09/05/2016 1522   08/28/2016 2200  aztreonam (AZACTAM) 1 g in dextrose 5 % 50 mL IVPB  Status:  Discontinued     1 g 100 mL/hr over 30 Minutes Intravenous Every 8 hours 09/02/2016 1324 08/08/2016 1522   08/27/2016 1600  linezolid (ZYVOX) tablet 600 mg  Status:  Discontinued     600 mg Oral Every 12 hours 08/09/2016 1458 08/27/16 1347   09/02/2016 1300  levofloxacin (LEVAQUIN) IVPB 750 mg  Status:  Discontinued     750 mg 100 mL/hr over 90 Minutes Intravenous  Once 09/06/2016 1246 08/09/2016 1522   09/04/2016 1300  aztreonam (AZACTAM) 2 g in dextrose 5 % 50 mL IVPB  Status:  Discontinued     2 g 100 mL/hr over 30 Minutes Intravenous  Once 08/15/2016 1246 09/01/2016 1522   08/19/2016 1300  vancomycin (VANCOCIN) IVPB 1000 mg/200 mL premix  Status:  Discontinued     1,000 mg 200 mL/hr over 60 Minutes Intravenous   Once 08/11/2016 1246 08/28/2016 1522      Medications:  Scheduled: . artificial tears   Both Eyes Q8H  . baclofen  10 mg Oral QID  . budesonide (PULMICORT) nebulizer  solution  0.5 mg Nebulization BID  . ceFEPime (MAXIPIME) IV  2 g Intravenous Q8H   And  . dexamethasone  4 mg Intravenous Q8H  . chlorhexidine gluconate (MEDLINE KIT)  15 mL Mouth Rinse BID  . insulin aspart  2-6 Units Subcutaneous Q4H  . insulin glargine  15 Units Subcutaneous QHS  . ipratropium  0.5 mg Nebulization BID  . levETIRAcetam  1,500 mg Intravenous Q12H  . mouth rinse  15 mL Mouth Rinse 10 times per day  . midazolam  40 mg Intravenous Once  . midodrine  20 mg Per Tube TID WC  . oxybutynin  5 mg Per Tube BID  . pantoprazole sodium  40 mg Per Tube Daily  . potassium chloride  40 mEq Per Tube Q4H  . scopolamine  1 patch Transdermal Q72H  . sulfamethoxazole-trimethoprim  240 mg of trimethoprim Intravenous Q8H  . valproate sodium  500 mg Intravenous Q8H    Objective: Vital signs in last 24 hours: Temp:  [97.8 F (36.6 C)-98.8 F (37.1 C)] 97.9 F (36.6 C) (12/07 1209) Pulse Rate:  [67-95] 91 (12/07 1222) Resp:  [18-19] 18 (12/07 1222) BP: (89-117)/(52-78) 117/78 (12/07 1222) SpO2:  [99 %-100 %] 100 % (12/07 1222) FiO2 (%):  [30 %] 30 % (12/07 1222) Weight:  [48.1 kg (106 lb)] 48.1 kg (106 lb) (12/07 0500)   General appearance: no distress Resp: rhonchi anterior - right Cardio: regular rate and rhythm GI: abnormal findings:  hypoactive bowel sounds  Lab Results  Recent Labs  09/12/16 0331 09/13/16 0434  WBC 16.6* 12.1*  HGB 7.6* 6.7*  HCT 22.8* 20.7*  NA 136 136  K 3.5 3.1*  CL 108 110  CO2 18* 19*  BUN 19 19  CREATININE 0.64 0.65   Liver Panel  Recent Labs  09/12/16 0331 09/13/16 0434  PROT 4.5* 4.5*  ALBUMIN 1.3* 1.2*  AST 14* 14*  ALT 8* 8*  ALKPHOS 181* 151*  BILITOT 0.5 0.4   Sedimentation Rate No results for input(s): ESRSEDRATE in the last 72 hours. C-Reactive  Protein No results for input(s): CRP in the last 72 hours.  Microbiology: Recent Results (from the past 240 hour(s))  Culture, respiratory (NON-Expectorated)     Status: None   Collection Time: 09/06/16 10:36 PM  Result Value Ref Range Status   Specimen Description TRACHEAL ASPIRATE  Final   Special Requests Normal  Final   Gram Stain   Final    MODERATE WBC PRESENT,BOTH PMN AND MONONUCLEAR FEW GRAM NEGATIVE RODS    Culture   Final    ABUNDANT PSEUDOMONAS AERUGINOSA ABUNDANT STENOTROPHOMONAS MALTOPHILIA THE PSEUDOMONAS AERUGINOSA IS A  MULTI-DRUG RESISTANT ORGANISM Results Called to: RN N.[place]  [id number] MLM    Report Status 09/10/2016 FINAL  Final   Organism ID, Bacteria STENOTROPHOMONAS MALTOPHILIA  Final   Organism ID, Bacteria PSEUDOMONAS AERUGINOSA  Final      Susceptibility   Pseudomonas aeruginosa - MIC*    CEFTAZIDIME 8 SENSITIVE Sensitive     CIPROFLOXACIN >=4 RESISTANT Resistant     GENTAMICIN >=16 RESISTANT Resistant     IMIPENEM >=16 RESISTANT Resistant     PIP/TAZO >=128 RESISTANT Resistant     CEFEPIME 8 SENSITIVE Sensitive     * ABUNDANT PSEUDOMONAS AERUGINOSA   Stenotrophomonas maltophilia - MIC*    LEVOFLOXACIN 1 SENSITIVE Sensitive     TRIMETH/SULFA <=20 SENSITIVE Sensitive     * ABUNDANT STENOTROPHOMONAS MALTOPHILIA    Studies/Results: Ct Head Wo Contrast  Result  Date: 09/11/2016 CLINICAL DATA:  30 year old Quadriplegic male with seizure activity yesterday and fever of unknown origin. Initial encounter. EXAM: CT HEAD WITHOUT CONTRAST TECHNIQUE: Contiguous axial images were obtained from the base of the skull through the vertex without intravenous contrast. COMPARISON:  Head CTs 08/28/2016 and earlier.  Brain MRI 08/17/2014. FINDINGS: Brain: Stable cerebral volume. No midline shift, ventriculomegaly, mass effect, evidence of mass lesion, intracranial hemorrhage or evidence of cortically based acute infarction. Gray-white matter differentiation is  within normal limits throughout the brain. No cortical encephalomalacia identified. Vascular: No suspicious intracranial vascular hyperdensity. Skull: No acute osseous abnormality identified. Sinuses/Orbits: Visualized paranasal sinuses and mastoids are stable and well pneumatized. There is mild mucosal thickening in the sphenoid sinuses which is unchanged since November. Tympanic cavities and mastoids remain clear. Other: Negative orbit and scalp soft tissues. IMPRESSION: Stable and negative noncontrast CT appearance of the brain. Electronically Signed   By: Genevie Ann M.D.   On: 09/11/2016 16:36   Ct Abdomen Pelvis W Contrast  Result Date: 09/11/2016 CLINICAL DATA:  30 year old Quadriplegic male with fever of unknown origin. Seizure like activity since yesterday. Initial encounter. EXAM: CT ABDOMEN AND PELVIS WITH CONTRAST TECHNIQUE: Multidetector CT imaging of the abdomen and pelvis was performed using the standard protocol following bolus administration of intravenous contrast. CONTRAST:  80m ISOVUE-300 IOPAMIDOL (ISOVUE-300) INJECTION 61% COMPARISON:  CT Abdomen and Pelvis 02/24/2016 and earlier. FINDINGS: Lower chest: Improved ventilation in the left lower lobe since 09/03/2016, but continued consolidation and increased peribronchial nodularity in the right lower lobe. Small layering right pleural effusion persists. Interval increased patchy and peribronchial opacity in the visible right middle lobe (series 205, image 3). No pericardial effusion.  No left pleural effusion. Hepatobiliary: Negative liver. Dependent sludge or vicarious contrast excretion in the gallbladder. The gallbladder is nondilated. No biliary ductal enlargement is evident. Pancreas: Negative. Spleen: Diminutive common negative. Adrenals/Urinary Tract: Normal adrenal glands. Both kidneys appear stable and within normal limits. Chronic suprapubic catheter. Generalized urinary bladder wall thickening. Small volume gas within the non  dependent portion of the bladder. Incidental pelvic phleboliths. Stomach/Bowel: Stable rectum, with mild perirectal fat stranding contiguous with the decubitus ulcers. Decompressed rectosigmoid colon. Left lower quadrant double barrel large bowel ostomy. Oral contrast has reached the ostomy and is within the descending colon. Gas distended transverse colon. Mildly contrast distended right colon. Evidence of a normal appendix containing contrast (series 21, image 55). No dilated small bowel. Gastrostomy tube in place with no adverse features. Vascular/Lymphatic: Suboptimal intravascular contrast bolus due to hand injection. Major arterial structures appear patent. IVC filter remains in place. No portal venous contrast at the time of imaging. No lymphadenopathy identified. Reproductive: Negative. Other: No pelvic free fluid. Small volume bilateral upper abdominal free fluid. Small to moderate volume of free fluid in the right mid abdomen about the right colon. No pneumoperitoneum. Regressed right paraspinal muscle gas at the L4 level since November suggesting regressed intramuscular abscess. Continued abnormal gas in the right SI joint. See skeletal findings below. Musculoskeletal: Moderate to severe heterogeneity of the T8, L1, L2 and L4 vertebral bodies with areas of bone erosion, nonspecific but compatible with sequelae of osteomyelitis. Superimposed mild compression fractures of T10, L3, and L5 could be pathologic fractures. Sequelae of sacral resection beginning at the distal S2 level. Widespread overlying decubitus ulcers at the sacrum and pelvis. Sequelae of septic right SI joint. Sequelae of chronic ischial tuberosity osteomyelitis. Overall Stable visualized osseous structures since the prior CT. IMPRESSION: 1. Improved left lower  lobe pneumonia since November but continued Right Lower Lobe Pneumonia with increased superior segment peribronchial opacity, an new abnormal right middle lobe opacity suspicious for  progression of right lung infection. Stable small layering right pleural effusion. 2. New small to moderate volume free fluid/ascites in the abdomen is nonspecific. 3. Otherwise no focal intra-abdominal or intra-pelvic inflammatory process identified. 4. 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 Associated right lower lumbar paraspinal muscle abscess probably is regressed since November. Sequelae of multilevel spinal osteomyelitis suspected and stable since November. 5. Chronic suprapubic catheter with nonspecific bladder wall thickening. No bowel obstruction. Percutaneous gastrostomy tube with no adverse features. Electronically Signed   By: Genevie Ann M.D.   On: 09/11/2016 16:50   Dg Chest Port 1 View  Result Date: 09/12/2016 CLINICAL DATA:  Tracheostomy EXAM: PORTABLE CHEST 1 VIEW COMPARISON:  09/07/2016 FINDINGS: Tracheostomy tube is unchanged in position. Central mild vascular congestion without convincing pulmonary edema. Improvement in aeration from prior exam. Residual streaky bilateral basilar atelectasis or infiltrate. Right IJ central line is unchanged in position. IMPRESSION: Stable tracheostomy position. Central mild vascular congestion without convincing pulmonary edema. Improvement in aeration from prior exam. Residual streaky bilateral basilar atelectasis or infiltrate. Electronically Signed   By: Lahoma Crocker M.D.   On: 09/12/2016 08:17     Assessment/Plan: C6 Spinal Infarct Quadraplegia Sepsis Streptococcal constellatus bacteremia Sacral Osteomyelitis Bone Bx 11-22 ESBL klebsiella Enterococcus (S- amp) Strep constellatus HCAP, aspiration Chronic Trach CXR worsening vascular congestion.  11-23Acinetobacter, Klebsiella 11-30 stenotrophomonas, pseudomonas Status epiliepticus RUE DVT  Total days of antibiotics: 17 12-4 cefepime,  bactrim  Imipenem  12-2 >> 12-4 Tygacil 11/21 - 11/24 Vancomycin 11/19 - 11/25 Eraxis 11/24 - 11/26 Aztreonam 11/19 - 11/21 Levaquin 11/19 - 11/20 Zyvox 11/19 - 11/24  Currently anbx aimed at his HCAP and to avoid further seizures.  Will defer regiemen to treat his sacral osteo at this point. WBC better, afebrile Watch bowels, defer C diff testing at this point.  Needs nutrition (severe protein calorie malnutrition)         [name] Infectious Diseases (pager) [phone] [email or web address] 09/13/2016, 1:58 PM  LOS: 18 days

## 2016-09-13 NOTE — Progress Notes (Signed)
LTM Maintenance. Checked Fp1,Fp2, A1, A2, and F3. No new skin breakdown was seen. Reprepped several leads.

## 2016-09-13 NOTE — Progress Notes (Signed)
[place] Progress Note Patient Name: Jason [name] DOB: [birth year] MRN: [id number]   Date of Service  09/13/2016  HPI/Events of Note  Anemia - Hgb = 6.7.  eICU Interventions  Will transfuse 1 unit PRBC when available.      Intervention Category Intermediate Interventions: Other:  Lysle Dingwall 09/13/2016, 5:51 AM

## 2016-09-13 NOTE — Progress Notes (Signed)
Name: DESMOND MCEWAN MRN: [id number] DOB: 04-Feb-1986    ADMISSION DATE:  08/28/2016 CONSULTATION DATE:  11/21  REFERRING MD :  [email or web address] ([facility])   CHIEF COMPLAINT:  Lethargy, vent mgmt   BRIEF PATIENT DESCRIPTION: 30 y.o. quadriplegic r/t C6 spinal cord injury, chronic trach, chronic infections in setting of open wounds sacrum and foot. Started on Azactam without premedication and he has become lethargic and has mild swelling around trach without compromise. Solumedrol has been started and he is HD stable. Mother reports this is his usual reaction to Azactam. PCCM saw 11/21 and signed off.  Called back 11/23 for worsening lethargy and request for vent support.    SIGNIFICANT EVENTS: 11/22 - surgical bx sacral bone/wound 12/04 - transfer to ICU with status epilepticus 12/7- eeg c remains, poor neurostatus  STUDIES:  CT Head w/o 11/21:  Mild atrophy.  No acute intracranial abnormality. Mucosal thickening within the sphenoid sinus. No air-fluid level is seen. 12/6 eeg >>>burst suppression  ANTIBIOTICS:  Per ID Tygacil 11/21 - 11/24 Vancomycin 11/19 - 11/25 Eraxis 11/24 - 11/26 Aztreonam 11/19 - 11/21 Levaquin 11/19 - 11/20 Zyvox 11/19 - 11/24 Imipenem 12/2 >> off seziures Cefepime 12/5>>> Bactrim 12.5>>>  MICROBIOLOGY:  Blood Ctx x3 11/19:  3/3 Streptococcus constellatus  Urine Ctx 11/19:  Acinetobacter & Enterococcus faecalis MRSA PCR 11/20:  Positive Sacral Bone Ctx 11/22:  ESBL Kleb pna, Enterococcus faecalis, strep constellatus Blood Ctx x2 11/23:  Negative  Tracheal Asp Ctx 11/23:  Klebsiella pneumoniae & Acinetobacter Tracheal Asp Ctx 11/24:  Klebsiella pneumoniae & Acinetobacter Tracheal Asp Ctx 11/30:  Pseudomonas aeruginosa & Stenotrophomonas   SUBJECTIVE:   Deep sedation status remains cEEG Off propofol Versed 10 remains  VITAL SIGNS: Temp:  [97.8 F (36.6 C)-98.8 F (37.1 C)] 97.8 F (36.6 C) (12/07 0900) Pulse Rate:  [67-96] 69 (12/07 0900) Resp:   [16-19] 18 (12/07 0900) BP: (89-122)/(52-77) 99/70 (12/07 0900) SpO2:  [99 %-100 %] 100 % (12/07 0900) FiO2 (%):  [30 %] 30 % (12/07 0734) Weight:  [48.1 kg (106 lb)] 48.1 kg (106 lb) (12/07 0500)  PHYSICAL EXAMINATION: General: some facial movement HEENT: Tracheostomy clean Cardiovascular:  s1 s2 Regular rhythm Pulmonary:  CTA Abdomen: Soft. Normal bowel sounds. Nondistended.  Neurological: no movement, no myoclonus, per none reactive   Recent Labs Lab 09/11/16 0500 09/12/16 0331 09/13/16 0434  NA 135 136 136  K 3.4* 3.5 3.1*  CL 104 108 110  CO2 18* 18* 19*  BUN 18 19 19   CREATININE 0.60* 0.64 0.65  GLUCOSE 183* 180* 156*    Recent Labs Lab 09/11/16 0500 09/12/16 0331 09/13/16 0434  HGB 8.0* 7.6* 6.7*  HCT 23.5* 22.8* 20.7*  WBC 15.0* 16.6* 12.1*  PLT 349 380 329   Ct Head Wo Contrast  Result Date: 09/11/2016 CLINICAL DATA:  30 year old Quadriplegic male with seizure activity yesterday and fever of unknown origin. Initial encounter. EXAM: CT HEAD WITHOUT CONTRAST TECHNIQUE: Contiguous axial images were obtained from the base of the skull through the vertex without intravenous contrast. COMPARISON:  Head CTs 08/28/2016 and earlier.  Brain MRI 08/17/2014. FINDINGS: Brain: Stable cerebral volume. No midline shift, ventriculomegaly, mass effect, evidence of mass lesion, intracranial hemorrhage or evidence of cortically based acute infarction. Gray-white matter differentiation is within normal limits throughout the brain. No cortical encephalomalacia identified. Vascular: No suspicious intracranial vascular hyperdensity. Skull: No acute osseous abnormality identified. Sinuses/Orbits: Visualized paranasal sinuses and mastoids are stable and well pneumatized. There is mild mucosal thickening in  the sphenoid sinuses which is unchanged since November. Tympanic cavities and mastoids remain clear. Other: Negative orbit and scalp soft tissues. IMPRESSION: Stable and negative  noncontrast CT appearance of the brain. Electronically Signed   By: Genevie Ann M.D.   On: 09/11/2016 16:36   Ct Abdomen Pelvis W Contrast  Result Date: 09/11/2016 CLINICAL DATA:  30 year old Quadriplegic male with fever of unknown origin. Seizure like activity since yesterday. Initial encounter. EXAM: CT ABDOMEN AND PELVIS WITH CONTRAST TECHNIQUE: Multidetector CT imaging of the abdomen and pelvis was performed using the standard protocol following bolus administration of intravenous contrast. CONTRAST:  79mL ISOVUE-300 IOPAMIDOL (ISOVUE-300) INJECTION 61% COMPARISON:  CT Abdomen and Pelvis 02/24/2016 and earlier. FINDINGS: Lower chest: Improved ventilation in the left lower lobe since 08/24/2016, but continued consolidation and increased peribronchial nodularity in the right lower lobe. Small layering right pleural effusion persists. Interval increased patchy and peribronchial opacity in the visible right middle lobe (series 205, image 3). No pericardial effusion.  No left pleural effusion. Hepatobiliary: Negative liver. Dependent sludge or vicarious contrast excretion in the gallbladder. The gallbladder is nondilated. No biliary ductal enlargement is evident. Pancreas: Negative. Spleen: Diminutive common negative. Adrenals/Urinary Tract: Normal adrenal glands. Both kidneys appear stable and within normal limits. Chronic suprapubic catheter. Generalized urinary bladder wall thickening. Small volume gas within the non dependent portion of the bladder. Incidental pelvic phleboliths. Stomach/Bowel: Stable rectum, with mild perirectal fat stranding contiguous with the decubitus ulcers. Decompressed rectosigmoid colon. Left lower quadrant double barrel large bowel ostomy. Oral contrast has reached the ostomy and is within the descending colon. Gas distended transverse colon. Mildly contrast distended right colon. Evidence of a normal appendix containing contrast (series 21, image 55). No dilated small bowel.  Gastrostomy tube in place with no adverse features. Vascular/Lymphatic: Suboptimal intravascular contrast bolus due to hand injection. Major arterial structures appear patent. IVC filter remains in place. No portal venous contrast at the time of imaging. No lymphadenopathy identified. Reproductive: Negative. Other: No pelvic free fluid. Small volume bilateral upper abdominal free fluid. Small to moderate volume of free fluid in the right mid abdomen about the right colon. No pneumoperitoneum. Regressed right paraspinal muscle gas at the L4 level since November suggesting regressed intramuscular abscess. Continued abnormal gas in the right SI joint. See skeletal findings below. Musculoskeletal: Moderate to severe heterogeneity of the T8, L1, L2 and L4 vertebral bodies with areas of bone erosion, nonspecific but compatible with sequelae of osteomyelitis. Superimposed mild compression fractures of T10, L3, and L5 could be pathologic fractures. Sequelae of sacral resection beginning at the distal S2 level. Widespread overlying decubitus ulcers at the sacrum and pelvis. Sequelae of septic right SI joint. Sequelae of chronic ischial tuberosity osteomyelitis. Overall Stable visualized osseous structures since the prior CT. IMPRESSION: 1. Improved left lower lobe pneumonia since November but continued Right Lower Lobe Pneumonia with increased superior segment peribronchial opacity, an new abnormal right middle lobe opacity suspicious for progression of right lung infection. Stable small layering right pleural effusion. 2. New small to moderate volume free fluid/ascites in the abdomen is nonspecific. 3. Otherwise no focal intra-abdominal or intra-pelvic inflammatory process identified. 4. 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 Associated right lower lumbar paraspinal muscle abscess probably is regressed since November. Sequelae of multilevel spinal osteomyelitis suspected and  stable since November. 5. Chronic suprapubic catheter with nonspecific bladder wall thickening. No bowel obstruction. Percutaneous gastrostomy tube with no adverse features. Electronically Signed   By: Lemmie Evens  [place] Crane M.D.   On: 09/11/2016 16:50   Dg Chest Port 1 View  Result Date: 09/12/2016 CLINICAL DATA:  Tracheostomy EXAM: PORTABLE CHEST 1 VIEW COMPARISON:  09/07/2016 FINDINGS: Tracheostomy tube is unchanged in position. Central mild vascular congestion without convincing pulmonary edema. Improvement in aeration from prior exam. Residual streaky bilateral basilar atelectasis or infiltrate. Right IJ central line is unchanged in position. IMPRESSION: Stable tracheostomy position. Central mild vascular congestion without convincing pulmonary edema. Improvement in aeration from prior exam. Residual streaky bilateral basilar atelectasis or infiltrate. Electronically Signed   By: Lahoma Crocker M.D.   On: 09/12/2016 08:17    ASSESSMENT / PLAN:  NEUROLOGICAL A: Status Epilepticus Acute Encephalopathy - Secondary to seizures. 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P: AEDs Per Neurology:  Keppra, Dilantin, Versed gtT Sedation:  As per Neurology Continuous EEG remains I have reviewed Baclofen qid Fentanyl IV prn Pain Consider versed reduction, per neuro  INFECTIOUS DISEASES A: Sepsis HCAP - Multiple Organisms Recurrent UTI Acute Osteomyelitis - Sacrum. Intra-Muscular Abscesses - R psoas, iliopsoas & iliacus.   P: Cefepime / bactrim started 12/5 ID seeing pt  PULMONARY A: Respiratory Alkalosis Chronic Hypoxic Respiratory Failure - S/P Tracheostomy. HCAP  P: Full Vent Support NO SBT, until at 5 versed or less Goal PS no trach collar today  RENAL A: Hypokalemia hypomag Chronic Suprapubic Catheter H/O Neurogenic Bladder  P: bmet in am  k supp Pos balance again, lasix x 1  CARDIOVASCULAR A: Acute on Chronic Diastolic CHF Acute DVT R Subclavian & Brachial Veins - Noted 11/27 & likely due to PICC that was  removed 11/22. Chronic Hypotension  P: Continuous telemetry monitoring Vitals per unit protocol Goal MAP >55 Neo-Synephrine off when prop off Continuing Midodrine TID  GASTROINTESTINAL A: Severe Protein-Calorie Malnutrition H/O Gastroparesis - Due to DM. Lower GI Bleed  P: feeds via peg Protonix VT daily BM noted  HEMATOLOGY/ONCOLOGY A: Leukocytosis - Likely due to sepsis. Improving. Anemia - No signs of active bleeding. Known anemia of chronic disease. H/O PE - S/P IVC Filter.  P: Cbc hct in am after 1 unit prbc SCDs  ENDOCRINE A: H/O DM Type 1 - Glucose erratic  Low 105 P: Lantus 12u [place] qhs increase 15 Accu-Checks to q4hr SSI per Moderate Algorithm   FAMILY UPDATE:  none  Ccm time 35 min   Lavon Paganini. Titus Mould, MD, [place] Pgr: [facility]

## 2016-09-13 NOTE — Progress Notes (Signed)
Trach change not done per Dr. Titus Mould.  Pt too unstable.

## 2016-09-13 NOTE — Progress Notes (Signed)
Vent circuit changed per protocol.

## 2016-09-13 NOTE — Progress Notes (Signed)
CRITICAL VALUE ALERT  Critical value received:  Hemoglobin 6.7  Date of notification:  09/13/2016  Time of notification:  0527  Critical value read back:Yes.    Nurse who received alert:  Paulla Fore  MD notified (1st page):  Som[place]  Time of first page:  0550  MD notified (2nd page):  Time of second page:  Responding MD:  Emmit Alexanders  Time MD responded:  [phone]

## 2016-09-13 NOTE — Progress Notes (Signed)
OT Cancellation Note  Patient Details Name: Jason [name] MRN: [id number] DOB: Apr 26, 1986   Cancelled Treatment:    Reason Eval/Treat Not Completed: Medical issues which prohibited therapy.  Pt sedated.   [name], OTR/L [id number]   Lucille Passy M 09/13/2016, 5:31 PM

## 2016-09-13 NOTE — Procedures (Signed)
History: 30 yo M with status epilepticus, now in induced coma  Sedation: Propofol, Versed  Technique: This is a 21 channel routine scalp EEG performed at the bedside with bipolar and monopolar montages arranged in accordance to the international 10/20 system of electrode placement. One channel was dedicated to EKG recording.    Background: The background consists of generalized suppression with bursts consisting of brief runs of alpha activity. There is a burst suppresion throguhout the recording, towards the second half of the recording, the bursts become slightly longer and IBI slightly shorter. The runs of alpha are replaced with intermixed delta and theta within the bursts.   Photic stimulation: Physiologic driving is not performed  EEG Abnormalities: 1) burst suppression pattern   Clinical Interpretation: This is consistent with a profound generalized cerebral dysfunction   Roland Rack, MD [facility] [phone]  If 7pm- 7am, please page neurology on call as listed in [place].

## 2016-09-14 ENCOUNTER — Inpatient Hospital Stay (HOSPITAL_COMMUNITY): Payer: Medicaid Other

## 2016-09-14 LAB — CBC WITH DIFFERENTIAL/PLATELET
Basophils Absolute: 0 10*3/uL (ref 0.0–0.1)
Basophils Relative: 0 %
EOS PCT: 0 %
Eosinophils Absolute: 0 10*3/uL (ref 0.0–0.7)
HEMATOCRIT: 25.8 % — AB (ref 39.0–52.0)
HEMOGLOBIN: 8.5 g/dL — AB (ref 13.0–17.0)
Lymphocytes Relative: 13 %
Lymphs Abs: 2.2 10*3/uL (ref 0.7–4.0)
MCH: 27.4 pg (ref 26.0–34.0)
MCHC: 32.9 g/dL (ref 30.0–36.0)
MCV: 83.2 fL (ref 78.0–100.0)
MONOS PCT: 16 %
Monocytes Absolute: 2.8 10*3/uL — ABNORMAL HIGH (ref 0.1–1.0)
NEUTROS PCT: 71 %
Neutro Abs: 12.2 10*3/uL — ABNORMAL HIGH (ref 1.7–7.7)
Platelets: 281 10*3/uL (ref 150–400)
RBC: 3.1 MIL/uL — AB (ref 4.22–5.81)
RDW: 17.2 % — AB (ref 11.5–15.5)
WBC: 17.2 10*3/uL — AB (ref 4.0–10.5)

## 2016-09-14 LAB — GLUCOSE, CAPILLARY
GLUCOSE-CAPILLARY: 168 mg/dL — AB (ref 65–99)
GLUCOSE-CAPILLARY: 52 mg/dL — AB (ref 65–99)
Glucose-Capillary: 71 mg/dL (ref 65–99)
Glucose-Capillary: 145 mg/dL — ABNORMAL HIGH (ref 65–99)
Glucose-Capillary: 61 mg/dL — ABNORMAL LOW (ref 65–99)
Glucose-Capillary: 144 mg/dL — ABNORMAL HIGH (ref 65–99)
Glucose-Capillary: 71 mg/dL (ref 65–99)
Glucose-Capillary: 309 mg/dL — ABNORMAL HIGH (ref 65–99)
Glucose-Capillary: 140 mg/dL — ABNORMAL HIGH (ref 65–99)
Glucose-Capillary: 183 mg/dL — ABNORMAL HIGH (ref 65–99)

## 2016-09-14 LAB — RENAL FUNCTION PANEL
ANION GAP: 7 (ref 5–15)
Albumin: 1.3 g/dL — ABNORMAL LOW (ref 3.5–5.0)
BUN: 19 mg/dL (ref 6–20)
CHLORIDE: 112 mmol/L — AB (ref 101–111)
CO2: 18 mmol/L — ABNORMAL LOW (ref 22–32)
Calcium: 7.4 mg/dL — ABNORMAL LOW (ref 8.9–10.3)
Creatinine, Ser: 0.58 mg/dL — ABNORMAL LOW (ref 0.61–1.24)
Glucose, Bld: 91 mg/dL (ref 65–99)
POTASSIUM: 4.4 mmol/L (ref 3.5–5.1)
Phosphorus: <1 mg/dL — CL (ref 2.5–4.6)
Sodium: 137 mmol/L (ref 135–145)

## 2016-09-14 LAB — COMPREHENSIVE METABOLIC PANEL
ALBUMIN: 1.3 g/dL — AB (ref 3.5–5.0)
ALK PHOS: 130 U/L — AB (ref 38–126)
ALT: 9 U/L — ABNORMAL LOW (ref 17–63)
ANION GAP: 7 (ref 5–15)
AST: 14 U/L — ABNORMAL LOW (ref 15–41)
BUN: 19 mg/dL (ref 6–20)
CALCIUM: 7.4 mg/dL — AB (ref 8.9–10.3)
CHLORIDE: 111 mmol/L (ref 101–111)
CO2: 18 mmol/L — AB (ref 22–32)
Creatinine, Ser: 0.64 mg/dL (ref 0.61–1.24)
GFR calc Af Amer: >60 mL/min (ref >60)
GFR calc non Af Amer: >60 mL/min (ref >60)
GLUCOSE: 91 mg/dL (ref 65–99)
Potassium: 4.3 mmol/L (ref 3.5–5.1)
SODIUM: 136 mmol/L (ref 135–145)
Total Bilirubin: 0.4 mg/dL (ref 0.3–1.2)
Total Protein: 4.3 g/dL — ABNORMAL LOW (ref 6.5–8.1)

## 2016-09-14 LAB — TYPE AND SCREEN
ABO/RH(D): O POS
Antibody Screen: NEGATIVE
UNIT DIVISION: 0

## 2016-09-14 LAB — VALPROIC ACID LEVEL
Valproic Acid Lvl: 15 ug/mL — ABNORMAL LOW (ref 50.0–100.0)
Valproic Acid Lvl: 24 ug/mL — ABNORMAL LOW (ref 50.0–100.0)

## 2016-09-14 LAB — TRIGLYCERIDES: TRIGLYCERIDES: 316 mg/dL — AB (ref <150)

## 2016-09-14 LAB — MAGNESIUM: MAGNESIUM: 1.7 mg/dL (ref 1.7–2.4)

## 2016-09-14 MED ORDER — PROPOFOL 1000 MG/100ML IV EMUL
100.0000 ug/kg/min | INTRAVENOUS | Status: DC
  Administered 2016-09-14 – 2016-09-15 (×5): 100 ug/kg/min via INTRAVENOUS
  Filled 2016-09-14 (×3): qty 100
  Filled 2016-09-14: qty 200
  Filled 2016-09-14 (×5): qty 100

## 2016-09-14 MED ORDER — DEXTROSE 50 % IV SOLN
INTRAVENOUS | Status: AC
  Administered 2016-09-14: 50 mL via INTRAVENOUS
  Filled 2016-09-14: qty 50

## 2016-09-14 MED ORDER — VALPROATE SODIUM 500 MG/5ML IV SOLN
1000.0000 mg | Freq: Once | INTRAVENOUS | Status: AC
  Administered 2016-09-14: 1000 mg via INTRAVENOUS
  Filled 2016-09-14: qty 10

## 2016-09-14 MED ORDER — PROPOFOL 1000 MG/100ML IV EMUL
INTRAVENOUS | Status: AC
  Administered 2016-09-14: 100 ug/kg/min via INTRAVENOUS
  Filled 2016-09-14: qty 100

## 2016-09-14 MED ORDER — SODIUM PHOSPHATES 45 MMOLE/15ML IV SOLN
30.0000 mmol | Freq: Once | INTRAVENOUS | Status: AC
  Administered 2016-09-14: 30 mmol via INTRAVENOUS
  Filled 2016-09-14: qty 10

## 2016-09-14 MED ORDER — ACETYLCYSTEINE 20 % IN SOLN
4.0000 mL | Freq: Two times a day (BID) | RESPIRATORY_TRACT | Status: DC
  Administered 2016-09-14 – 2016-09-15 (×3): 4 mL via RESPIRATORY_TRACT
  Filled 2016-09-14 (×3): qty 4

## 2016-09-14 MED ORDER — VALPROATE SODIUM 500 MG/5ML IV SOLN
1000.0000 mg | Freq: Three times a day (TID) | INTRAVENOUS | Status: DC
  Administered 2016-09-14 – 2016-09-15 (×4): 1000 mg via INTRAVENOUS
  Filled 2016-09-14 (×6): qty 10

## 2016-09-14 MED ORDER — ACETYLCYSTEINE 20 % IN SOLN
4.0000 mL | Freq: Two times a day (BID) | RESPIRATORY_TRACT | Status: DC
  Administered 2016-09-14: 4 mL via RESPIRATORY_TRACT
  Filled 2016-09-14: qty 4

## 2016-09-14 MED ORDER — DEXTROSE 50 % IV SOLN
INTRAVENOUS | Status: AC
  Administered 2016-09-14: 25 mL via INTRAVENOUS
  Filled 2016-09-14: qty 50

## 2016-09-14 MED ORDER — DEXTROSE 50 % IV SOLN
25.0000 mL | Freq: Once | INTRAVENOUS | Status: AC
  Administered 2016-09-14: 25 mL via INTRAVENOUS

## 2016-09-14 MED ORDER — SODIUM CHLORIDE 0.9 % IV SOLN
200.0000 mg | Freq: Once | INTRAVENOUS | Status: AC
  Administered 2016-09-14: 200 mg via INTRAVENOUS
  Filled 2016-09-14: qty 20

## 2016-09-14 MED ORDER — DEXTROSE 50 % IV SOLN
1.0000 | Freq: Once | INTRAVENOUS | Status: AC
  Administered 2016-09-14: 50 mL via INTRAVENOUS

## 2016-09-14 MED ORDER — PHENYLEPHRINE HCL 10 MG/ML IJ SOLN
0.0000 ug/min | INTRAVENOUS | Status: DC
  Administered 2016-09-14 – 2016-09-15 (×3): 10 ug/min via INTRAVENOUS
  Administered 2016-09-16: 50 ug/min via INTRAVENOUS
  Administered 2016-09-16: 40 ug/min via INTRAVENOUS
  Administered 2016-09-16: 50 ug/min via INTRAVENOUS
  Administered 2016-09-16: 40 ug/min via INTRAVENOUS
  Filled 2016-09-14 (×7): qty 1

## 2016-09-14 MED ORDER — SODIUM CHLORIDE 0.9 % IV SOLN
INTRAVENOUS | Status: DC
  Administered 2016-09-14: 250 mL via INTRAVENOUS
  Administered 2016-09-15: 1 mL via INTRAVENOUS

## 2016-09-14 MED ORDER — SODIUM CHLORIDE 0.9 % IV SOLN
100.0000 mg | Freq: Two times a day (BID) | INTRAVENOUS | Status: DC
  Administered 2016-09-14 – 2016-09-23 (×19): 100 mg via INTRAVENOUS
  Filled 2016-09-14 (×38): qty 10

## 2016-09-14 MED ORDER — TOPIRAMATE 100 MG PO TABS
100.0000 mg | ORAL_TABLET | Freq: Two times a day (BID) | ORAL | Status: DC
  Administered 2016-09-14 – 2016-10-05 (×41): 100 mg via ORAL
  Filled 2016-09-14 (×45): qty 1

## 2016-09-14 MED ORDER — ALBUTEROL SULFATE (2.5 MG/3ML) 0.083% IN NEBU
2.5000 mg | INHALATION_SOLUTION | Freq: Two times a day (BID) | RESPIRATORY_TRACT | Status: DC
  Administered 2016-09-14 – 2016-09-15 (×4): 2.5 mg via RESPIRATORY_TRACT
  Filled 2016-09-14 (×4): qty 3

## 2016-09-14 NOTE — Progress Notes (Signed)
Pharmacy Antibiotic Note  Jason [name] is a 30 y.o. male with very complicated ID history. He is currently on day #4/5 cefepime and septra. Renal function stable.  Plan: 1) Continue cefepime 2g IV q8 2) Continue septra 240mg  IV q8 (~5 mg/kg/dose)   Height: 5\' 8"  (172.7 cm) Weight: 102 lb (46.3 kg) IBW/kg (Calculated) : 68.4  Temp (24hrs), Avg:98.8 F (37.1 C), Min:97.8 F (36.6 C), Max:99.7 F (37.6 C)   Recent Labs Lab 09/08/16 0653  09/10/16 0550 09/11/16 0500 09/12/16 0331 09/13/16 0434 09/14/16 0453  WBC 16.3*  < > 14.4* 15.0* 16.6* 12.1* 17.2*  CREATININE 0.60*  --   --  0.60* 0.64 0.65 0.58*  0.64  < > = values in this interval not displayed.  Estimated Creatinine Clearance: 86.8 mL/min (by C-G formula based on SCr of 0.58 mg/dL (L)).    Allergies  Allergen Reactions  . Amikacin Sulfate Other (See Comments)    Seizure   . Cefuroxime Axetil Anaphylaxis    Tolerated cefepime 12/2015 - with Pepcid/Solu-Medrol  . Ertapenem Other (See Comments)    Rash and confusion-->tolerated Imipenem   . Imipenem     Possible cause of seizures  . Meropenem Other (See Comments)    Altered mental status, hallucinations  . Morphine And Related Other (See Comments)    Changed mental status, confusion, headache, visual hallucination  . Nsaids Itching and Other (See Comments)    Risk of bleeding, itching  . Penicillins Anaphylaxis and Other (See Comments)    Tolerated Imipenem; no reaction to 7 day course of amoxicillin in 2015 Has patient had a PCN reaction causing immediate rash, facial/tongue/throat swelling, SOB or lightheadedness with hypotension: Yes Has patient had a PCN reaction causing severe rash involving mucus membranes or skin necrosis: No Has patient had a PCN reaction that required hospitalization Yes Has patient had a PCN reaction occurring within the last 10 years: No If all of the above answers are "NO", then may proceed with Cephalo  . Sulfa Antibiotics  Anaphylaxis, Shortness Of Breath and Other (See Comments)  . Tessalon [Benzonatate] Anaphylaxis  . Aztreonam Swelling    He appears to tolerate if you premedicate with steroids. Had facial/neck swelling without premedication  . Levaquin [Levofloxacin In D5w] Swelling    Hand and forearm swelling  . Rocephin [Ceftriaxone] Other (See Comments)    Confusion  . Shellfish Allergy Itching and Other (See Comments)    Took benadryl to alleviate reaction    Antimicrobials this admission:  Vancomycin 11/20 >>11/21, 11/25 Aztreonam 11/20 >>11/21 (allergy added, swelling of neck 11/21) Tigecycline 11/22 >> 11/24 Linezolid 11/19 >> 11/20; 11/21 >> 11/24 Eraxis 11/24 >>11/26 Imipenem w/ premed 11/24 >> 12/4 Cefepime 12/4>>12/5, 12/5>> Levaquin 12/4>> 12/5 Lvq ordered 12/19-20 before, but refused d/t allergic Cefepime + Levaquin are compatibile Septra 12/5>> Merrem 12/5 x 1 dose (mom refused)  Microbiology results:  11/19 BCx x 3: Viridans strep - S ceftriaxone, eryth, levo, PCN, vanc 11/19 UCx (suprapub cath): >100k col yeast; >100k col E.faecalis; 20k col acinetobacter calcoaceticus/baumannii (known to be colonized per ID) 11/20 MRSA PCR: positive 11/22 Sacral tissue culture: Kleb Pneumo (S-Imipenem) and E faecalis (S-ampicillin)  11/23 BCx: ngF 11/23 TA: Kleb Pneumo (S-cipro, imipenem) and acinetobacter (S-Unasyn, imipenem, gentamicin) 11/24 TA: Acinetobacter calcoaceticus/baumannii complex 11/30 TA - Abundant Pseudomonas - sensitive to Cefepime and Ceftazidime; resistant to Imipenem, Zosyn and Gentamicin   and abundant Stenotrophomonas - sensitive to Levaquin and Septra  Thank you for allowing pharmacy to be a  part of this patient's care.  Deboraha Sprang 09/14/2016 8:31 AM

## 2016-09-14 NOTE — Progress Notes (Signed)
FP1, FP2, ground, T5, T6, A2, CZ electrodes fixed and re glued, patient was red under Fp1, FP2, and A2. No further skin breakdown seen. Electrode impedances are 5k ohms and below.

## 2016-09-14 NOTE — Progress Notes (Signed)
[facility] Progress Note Patient Name: Jason [name] DOB: 1986/01/07 MRN: [id number]   Date of Service  09/14/2016  HPI/Events of Note  hypophos  eICU Interventions  Phos replaced     Intervention Category Intermediate Interventions: Electrolyte abnormality - evaluation and management  DETERDING,ELIZABETH 09/14/2016, 5:55 AM

## 2016-09-14 NOTE — Progress Notes (Signed)
OT Cancellation Note  Patient Details Name: Jason [name] MRN: [id number] DOB: 14-Jul-1986   Cancelled Treatment:    Reason Eval/Treat Not Completed: Medical issues which prohibited therapy.  Pt on continuous EEG.  Will check back next week for appropriateness.  [place], OTR/L [id number]   Lucille Passy M 09/14/2016, 12:49 PM

## 2016-09-14 NOTE — Progress Notes (Signed)
Hypoglycemic Event  CBG: 52  Treatment: 1 amp D50  Symptoms: none  Follow-up CBG: Time: 2025 CBG Result: 140  Possible Reasons for Event: unknown  Comments/MD notified:CCM notified (e-link)    Jason [name]

## 2016-09-14 NOTE — Progress Notes (Signed)
INFECTIOUS DISEASE PROGRESS NOTE  ID: Jason [name] is a 30 y.o. 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 ([place])   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([place])   History of pulmonary embolism   DVT (deep venous thrombosis) ([name])   Severe protein-calorie malnutrition ([place])   Dysphagia, pharyngoesophageal phase   Decubitus ulcer of right perineal ischial region, stage 4 ([place])   Decubitus ulcer of left perineal ischial region, stage 4 ([place])   Sacral decubitus ulcer, stage IV ([name])   Multiple allergies   Recurrent UTI   Respiratory failure, chronic ([place])   Aspiration pneumonia of right lower lobe ([name])   Seizures ([name])   Anxiety state   Anemia of chronic disease   Hyponatremia   Encephalopathy   Other acute osteomyelitis, multiple sites ([place])   Lower GI bleed   Viridans streptococci infection   Coma ([place])   Septic shock ([name])   Anaphylactic syndrome   Acute hypoxemic respiratory failure ([place])   Enterococcal infection   Thrombocytopenia ([place])   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[place])   Chronic respiratory failure with hypoxia ([place])   Acute respiratory acidosis  Subjective: No response  Abtx:  Anti-infectives    Start     Dose/Rate Route Frequency Ordered Stop   09/11/16 2200  ceFEPIme (MAXIPIME) 2 g in dextrose 5 % 50 mL IVPB  Status:  Discontinued     2 g 100 mL/hr over 30 Minutes Intravenous Every 8 hours 09/11/16 1830 09/11/16 1838   09/11/16 2200  ceFEPIme (MAXIPIME) 2 g in dextrose 5 % 50 mL IVPB     2 g 100 mL/hr over 30 Minutes Intravenous Every 8 hours 09/11/16 1838     09/11/16 2000  ceFEPIme (MAXIPIME) 2 g in dextrose 5 % 50 mL IVPB  Status:  Discontinued     2 g 100 mL/hr over 30 Minutes Intravenous Every 8 hours 09/11/16 1813 09/11/16 1830   09/11/16 1400  meropenem (MERREM) 1 g in sodium chloride 0.9 % 100  mL IVPB  Status:  Discontinued     1 g 200 mL/hr over 30 Minutes Intravenous Every 8 hours 09/11/16 1150 09/11/16 1813   09/11/16 1400  sulfamethoxazole-trimethoprim (BACTRIM) 240 mg of trimethoprim in dextrose 5 % 250 mL IVPB     240 mg of trimethoprim 265 mL/hr over 60 Minutes Intravenous Every 8 hours 09/11/16 1156     09/10/16 2100  levofloxacin (LEVAQUIN) IVPB 750 mg  Status:  Discontinued     750 mg 100 mL/hr over 90 Minutes Intravenous Every 24 hours 09/10/16 1849 09/11/16 1115   09/10/16 2000  ceFEPIme (MAXIPIME) 2 g in dextrose 5 % 50 mL IVPB  Status:  Discontinued     2 g 100 mL/hr over 30 Minutes Intravenous Every 8 hours 09/10/16 1847 09/11/16 1140   09/08/16 1700  imipenem-cilastatin (PRIMAXIN) 1,000 mg in sodium chloride 0.9 % 250 mL IVPB  Status:  Discontinued     1,000 mg 250 mL/hr over 60 Minutes Intravenous Every 8 hours 09/08/16 1159 09/10/16 1736   09/01/16 1300  anidulafungin (ERAXIS) 100 mg in sodium chloride 0.9 % 100 mL IVPB  Status:  Discontinued     100 mg over 90 Minutes Intravenous Every 24 hours 08/31/16 1240 09/02/16 1252   09/01/16 1200  vancomycin (VANCOCIN) IVPB 750 mg/150 ml premix  Status:  Discontinued     750 mg 150 mL/hr over 60 Minutes Intravenous Every 24 hours 09/01/16 1145 09/01/16 1712   08/31/16 1700  imipenem-cilastatin (PRIMAXIN) 500 mg in sodium chloride 0.9 % 100 mL IVPB  Status:  Discontinued     500 mg 200 mL/hr over 30 Minutes Intravenous Every 8 hours 08/31/16 1240 09/08/16 1159   08/31/16 1300  anidulafungin (ERAXIS) 200 mg in sodium chloride 0.9 % 200 mL IVPB     200 mg over 180 Minutes Intravenous  Once 08/31/16 1240 08/31/16 1810   08/16/2016 0557  tigecycline (TYGACIL) 50 mg in sodium chloride 0.9 % 100 mL IVPB  Status:  Discontinued     50 mg 200 mL/hr over 30 Minutes Intravenous Every 12 hours 08/28/16 1758 08/31/16 1151   08/28/16 1800  tigecycline (TYGACIL) 100 mg in sodium chloride 0.9 % 100 mL IVPB  Status:  Discontinued      100 mg 200 mL/hr over 30 Minutes Intravenous  Once 08/28/16 1758 08/31/16 1424   08/28/16 1300  linezolid (ZYVOX) IVPB 600 mg  Status:  Discontinued     600 mg 300 mL/hr over 60 Minutes Intravenous Every 12 hours 08/28/16 1239 08/31/16 2014   08/28/16 0200  aztreonam (AZACTAM) 1 g in dextrose 5 % 50 mL IVPB  Status:  Discontinued     1 g 100 mL/hr over 30 Minutes Intravenous Every 8 hours 08/27/16 1802 08/28/16 1232   08/27/16 2000  vancomycin (VANCOCIN) 500 mg in sodium chloride 0.9 % 100 mL IVPB  Status:  Discontinued     500 mg 100 mL/hr over 60 Minutes Intravenous Every 12 hours 08/27/16 1802 08/28/16 1239   08/27/16 1800  aztreonam (AZACTAM) 2 g in dextrose 5 % 50 mL IVPB     2 g 100 mL/hr over 30 Minutes Intravenous  Once 08/27/16 1634 08/27/16 1852   08/27/16 1600  levofloxacin (LEVAQUIN) IVPB 750 mg  Status:  Discontinued     750 mg 100 mL/hr over 90 Minutes Intravenous Every 24 hours 08/24/2016 1324 08/30/2016 1522   08/27/16 0000  vancomycin (VANCOCIN) 500 mg in sodium chloride 0.9 % 100 mL IVPB  Status:  Discontinued     500 mg 100 mL/hr over 60 Minutes Intravenous Every 12 hours 08/24/2016 1324 09/04/2016 1522   08/10/2016 2200  aztreonam (AZACTAM) 1 g in dextrose 5 % 50 mL IVPB  Status:  Discontinued     1 g 100 mL/hr over 30 Minutes Intravenous Every 8 hours 08/13/2016 1324 08/17/2016 1522   09/05/2016 1600  linezolid (ZYVOX) tablet 600 mg  Status:  Discontinued     600 mg Oral Every 12 hours 09/05/2016 1458 08/27/16 1347   09/05/2016 1300  levofloxacin (LEVAQUIN) IVPB 750 mg  Status:  Discontinued     750 mg 100 mL/hr over 90 Minutes Intravenous  Once 08/27/2016 1246 08/08/2016 1522   08/12/2016 1300  aztreonam (AZACTAM) 2 g in dextrose 5 % 50 mL IVPB  Status:  Discontinued     2 g 100 mL/hr over 30 Minutes Intravenous  Once 09/01/2016 1246 08/28/2016 1522   08/23/2016 1300  vancomycin (VANCOCIN) IVPB 1000 mg/200 mL premix  Status:  Discontinued     1,000 mg 200 mL/hr over 60 Minutes Intravenous   Once 09/03/2016 1246 08/11/2016 1522      Medications:  Scheduled: . artificial tears   Both Eyes Q8H  . baclofen  10 mg Oral QID  . budesonide (PULMICORT) nebulizer solution  0.5 mg Nebulization  BID  . ceFEPime (MAXIPIME) IV  2 g Intravenous Q8H   And  . dexamethasone  4 mg Intravenous Q8H  . chlorhexidine gluconate (MEDLINE KIT)  15 mL Mouth Rinse BID  . insulin aspart  2-6 Units Subcutaneous Q4H  . insulin glargine  15 Units Subcutaneous QHS  . ipratropium  0.5 mg Nebulization BID  . levETIRAcetam  1,500 mg Intravenous Q12H  . mouth rinse  15 mL Mouth Rinse 10 times per day  . midazolam  40 mg Intravenous Once  . midodrine  20 mg Per Tube TID WC  . oxybutynin  5 mg Per Tube BID  . pantoprazole sodium  40 mg Per Tube Daily  . scopolamine  1 patch Transdermal Q72H  . sodium phosphate  Dextrose 5% IVPB  30 mmol Intravenous Once  . sulfamethoxazole-trimethoprim  240 mg of trimethoprim Intravenous Q8H  . valproate sodium  750 mg Intravenous Q8H    Objective: Vital signs in last 24 hours: Temp:  [97.9 F (36.6 C)-99.7 F (37.6 C)] 99.7 F (37.6 C) (12/08 0410) Pulse Rate:  [67-111] 93 (12/08 0736) Resp:  [0-23] 18 (12/08 0736) BP: (88-132)/(55-93) 97/63 (12/08 0600) SpO2:  [98 %-100 %] 99 % (12/08 0736) FiO2 (%):  [30 %] 30 % (12/08 0736) Weight:  [46.3 kg (102 lb)] 46.3 kg (102 lb) (12/08 0500)   General appearance: no distress Resp: rhonchi bilaterally Cardio: regular rate and rhythm GI: normal findings: soft, non-tender and abnormal findings:  hypoactive bowel sounds Neurologic: Motor: jerknig movements of mouth. does not repsond to voice  Lab Results  Recent Labs  09/13/16 0434 09/13/16 1456 09/14/16 0453  WBC 12.1*  --  17.2*  HGB 6.7* 9.7* 8.5*  HCT 20.7* 29.0* 25.8*  NA 136  --  137  136  K 3.1*  --  4.4  4.3  CL 110  --  112*  111  CO2 19*  --  18*  18*  BUN 19  --  19  19  CREATININE 0.65  --  0.58*  0.64   Liver Panel  Recent Labs   09/13/16 0434 09/14/16 0453  PROT 4.5* 4.3*  ALBUMIN 1.2* 1.3*  1.3*  AST 14* 14*  ALT 8* 9*  ALKPHOS 151* 130*  BILITOT 0.4 0.4   Sedimentation Rate No results for input(s): ESRSEDRATE in the last 72 hours. C-Reactive Protein No results for input(s): CRP in the last 72 hours.  Microbiology: Recent Results (from the past 240 hour(s))  Culture, respiratory (NON-Expectorated)     Status: None   Collection Time: 09/06/16 10:36 PM  Result Value Ref Range Status   Specimen Description TRACHEAL ASPIRATE  Final   Special Requests Normal  Final   Gram Stain   Final    MODERATE WBC PRESENT,BOTH PMN AND MONONUCLEAR FEW GRAM NEGATIVE RODS    Culture   Final    ABUNDANT PSEUDOMONAS AERUGINOSA ABUNDANT STENOTROPHOMONAS MALTOPHILIA THE PSEUDOMONAS AERUGINOSA IS A  MULTI-DRUG RESISTANT ORGANISM Results Called to: RN N.[place]  [id number] MLM    Report Status 09/10/2016 FINAL  Final   Organism ID, Bacteria STENOTROPHOMONAS MALTOPHILIA  Final   Organism ID, Bacteria PSEUDOMONAS AERUGINOSA  Final      Susceptibility   Pseudomonas aeruginosa - MIC*    CEFTAZIDIME 8 SENSITIVE Sensitive     CIPROFLOXACIN >=4 RESISTANT Resistant     GENTAMICIN >=16 RESISTANT Resistant     IMIPENEM >=16 RESISTANT Resistant     PIP/TAZO >=128 RESISTANT Resistant  CEFEPIME 8 SENSITIVE Sensitive     * ABUNDANT PSEUDOMONAS AERUGINOSA   Stenotrophomonas maltophilia - MIC*    LEVOFLOXACIN 1 SENSITIVE Sensitive     TRIMETH/SULFA <=20 SENSITIVE Sensitive     * ABUNDANT STENOTROPHOMONAS MALTOPHILIA    Studies/Results: Dg Chest Port 1 View  Result Date: 09/14/2016 CLINICAL DATA:  Respiratory failure EXAM: PORTABLE CHEST 1 VIEW COMPARISON:  09/12/2016 FINDINGS: Tracheostomy and right central line remain in place, unchanged. Heart is normal size. Bilateral airspace disease, right greater than left, slightly worsened since prior study. Findings concerning for pneumonia. Possible small bilateral effusions.  No acute bony abnormality. IMPRESSION: Worsening patchy bilateral airspace disease, right greater than left concerning for pneumonia. Question small effusions. Electronically Signed   By: Rolm Baptise M.D.   On: 09/14/2016 08:13     Assessment/Plan: C6 Spinal Infarct Quadraplegia Sepsis Streptococcal constellatus bacteremia Sacral Osteomyelitis Bone Bx 11-22 ESBL klebsiella Enterococcus (S- amp) Strep constellatus HCAP, aspiration Chronic Trach CXR worsening vascular congestion.    11-23Acinetobacter, Klebsiella 11-30 stenotrophomonas, pseudomonas Status epiliepticus RUE DVT  Total days of antibiotics: 18 12-4 cefepime, bactrim  Imipenem 12-2 >>12-4 Tygacil 11/21 - 11/24 Vancomycin 11/19 - 11/25 Eraxis 11/24 - 11/26 Aztreonam 11/19 - 11/21 Levaquin 11/19 - 11/20 Zyvox 11/19 - 11/24  Would consider stop anbx at day 8 This is a short course for pseudomonas however he is most likely colonized.  His CXR is not improved.  We can re-address his anbx for sacral osteo at a later time.   Dr Megan Salon available if questions over weekend.          [name] Infectious Diseases (pager) [phone] [email or web address] 09/14/2016, 9:10 AM  LOS: 19 days

## 2016-09-14 NOTE — Progress Notes (Signed)
Name: Jason [name] MRN: [id number] DOB: 1986-06-08    ADMISSION DATE:  08/31/2016 CONSULTATION DATE:  11/21  REFERRING MD :  hongalgi ([facility])   CHIEF COMPLAINT:  Lethargy, vent mgmt   BRIEF PATIENT DESCRIPTION: 30 y.o. quadriplegic r/t C6 spinal cord injury, chronic trach, chronic infections in setting of open wounds sacrum and foot. Started on Azactam without premedication and he has become lethargic and has mild swelling around trach without compromise. Solumedrol has been started and he is HD stable. Mother reports this is his usual reaction to Azactam. PCCM saw 11/21 and signed off.  Called back 11/23 for worsening lethargy and request for vent support.    SIGNIFICANT EVENTS: 11/22 - surgical bx sacral bone/wound 12/04 - transfer to ICU with status epilepticus 12/7- eeg c remains, poor neurostatus  STUDIES:  CT Head w/o 11/21:  Mild atrophy.  No acute intracranial abnormality. Mucosal thickening within the sphenoid sinus. No air-fluid level is seen. 12/6 eeg >>>burst suppression  ANTIBIOTICS:  Per ID Tygacil 11/21 - 11/24 Vancomycin 11/19 - 11/25 Eraxis 11/24 - 11/26 Aztreonam 11/19 - 11/21 Levaquin 11/19 - 11/20 Zyvox 11/19 - 11/24 Imipenem 12/2 >> off seziures Cefepime 12/5>>> Bactrim 12.5>>>  MICROBIOLOGY:  Blood Ctx x3 11/19:  3/3 Streptococcus constellatus  Urine Ctx 11/19:  Acinetobacter & Enterococcus faecalis MRSA PCR 11/20:  Positive Sacral Bone Ctx 11/22:  ESBL Kleb pna, Enterococcus faecalis, strep constellatus Blood Ctx x2 11/23:  Negative  Tracheal Asp Ctx 11/23:  Klebsiella pneumoniae & Acinetobacter Tracheal Asp Ctx 11/24:  Klebsiella pneumoniae & Acinetobacter Tracheal Asp Ctx 11/30:  Pseudomonas aeruginosa & Stenotrophomonas   SUBJECTIVE:   More awake, moving arms head More ronchi pcxr worse VITAL SIGNS: Temp:  [97.9 F (36.6 C)-99.7 F (37.6 C)] 99.7 F (37.6 C) (12/08 0410) Pulse Rate:  [72-111] 93 (12/08 0736) Resp:  [0-23] 18  (12/08 0736) BP: (88-132)/(55-93) 97/63 (12/08 0600) SpO2:  [98 %-100 %] 99 % (12/08 0736) FiO2 (%):  [30 %] 30 % (12/08 0736) Weight:  [46.3 kg (102 lb)] 46.3 kg (102 lb) (12/08 0500)  PHYSICAL EXAMINATION: General: some facial movement, neck ,arms HEENT: Tracheostomy clean Cardiovascular:  s1 s2 Regular rhythm Pulmonary:  ronchi rt base Abdomen: Soft. Normal bowel sounds. Nondistended.  Neurological:perr, moves etx upper, head, not fc   Recent Labs Lab 09/12/16 0331 09/13/16 0434 09/14/16 0453  NA 136 136 137  136  K 3.5 3.1* 4.4  4.3  CL 108 110 112*  111  CO2 18* 19* 18*  18*  BUN _0 CREATININE 0.64 0.65 0.58*  0.64  GLUCOSE 180* 156* 91  91    Recent Labs Lab 09/12/16 0331 09/13/16 0434 09/13/16 1456 09/14/16 0453  HGB 7.6* 6.7* 9.7* 8.5*  HCT 22.8* 20.7* 29.0* 25.8*  WBC 16.6* 12.1*  --  17.2*  PLT 380 329  --  281   Dg Chest Port 1 View  Result Date: 09/14/2016 CLINICAL DATA:  Respiratory failure EXAM: PORTABLE CHEST 1 VIEW COMPARISON:  09/12/2016 FINDINGS: Tracheostomy and right central line remain in place, unchanged. Heart is normal size. Bilateral airspace disease, right greater than left, slightly worsened since prior study. Findings concerning for pneumonia. Possible small bilateral effusions. No acute bony abnormality. IMPRESSION: Worsening patchy bilateral airspace disease, right greater than left concerning for pneumonia. Question small effusions. Electronically Signed   By: Rolm Baptise M.D.   On: 09/14/2016 08:13    ASSESSMENT / PLAN:  NEUROLOGICAL A: Status Epilepticus  Acute Encephalopathy - Secondary to seizures. 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P: AEDs Per Neurology:  Keppra, Dilantin, Versed gtT Sedation:  As per Neurology Continuous EEG consider dc Baclofen qid Fentanyl IV prn Pain Off versed drip   INFECTIOUS DISEASES A: Sepsis HCAP - Multiple Organisms Recurrent UTI Acute Osteomyelitis - Sacrum. Intra-Muscular Abscesses - R  psoas, iliopsoas & iliacus.   P: Cefepime / bactrim started 12/5 ID appreciated Get sputum See pulm  PULMONARY A: Respiratory Alkalosis Chronic Hypoxic Respiratory Failure - S/P Tracheostomy. HCAP Worsening atx P: Full Vent Support Worsening atx/ infiltrate, add mucomyst, add chest pt, peep to 8 Repeat pcxr in am   RENAL A: Hypokalemia hypomag Chronic Suprapubic Catheter H/O Neurogenic Bladder hypophos hyperchloremia P: bmet in am  k supp Phos supp Cl to follow, may need 1/2 NS Dc lasix  CARDIOVASCULAR A: Acute on Chronic Diastolic CHF Acute DVT R Subclavian & Brachial Veins - Noted 11/27 & likely due to PICC that was removed 11/22. Chronic Hypotension  P: Continuous telemetry monitoring Vitals per unit protocol Goal MAP >55, met on own Neo-Synephrine off when prop off Continuing Midodrine TID  GASTROINTESTINAL A: Severe Protein-Calorie Malnutrition H/O Gastroparesis - Due to DM. Lower GI Bleed  P: feeds via peg Protonix VT daily  HEMATOLOGY/ONCOLOGY A: Leukocytosis - Likely due to sepsis. Improving. Anemia - No signs of active bleeding. Known anemia of chronic disease. H/O PE - S/P IVC Filter. 1 unit prbc 12.7 P: Cbc am  SCDs  ENDOCRINE A: H/O DM Type 1 - Glucose erratic  Low 171 P: Lantus 15, may need reduce Accu-Checks to q4hr SSI per Moderate Algorithm   FAMILY UPDATE:  none  Ccm time 35 min   Lavon Paganini. Titus Mould, MD, [place] Pgr: [facility]

## 2016-09-14 NOTE — Progress Notes (Signed)
Valproic acid level resulted as < 10. Supplemental loading dose of 1000 mg valproic acid IV x 1 has been ordered. Scheduled TID dose increased to 750 mg. Obtaining repeat level at 9 AM. Residual Dilantin likely to be affecting metabolism, therefore will need to continue to monitor valproic acid levels for the next few days and adjust dosages as indicated.   Electronically signed: Dr. Kerney Elbe

## 2016-09-14 NOTE — Progress Notes (Signed)
Subjective: Intubated   Exam: Vitals:   09/14/16 1500 09/14/16 1600  BP: 107/66 (!) 96/57  Pulse: 98 (!) 104  Resp: 18 19  Temp:     Gen: In bed, NAD Resp: non-labored breathing, no acute distress Abd: soft, nt  Neuro: MS:  [id number] reactive but sluggish, face symmetric Motor: no movement  Sensory:as above   Impression: 30 yo M with new onset refratory status epilepticus, Sedation was weaned overnight and he began having intermittent generalized epileptiform discharges. There was no clear evolution to these discharges to confirm an ictal nature, however later in the day, he developed clonic activity which corresponded to the discharges.   Given this, I have restarted propofol to allow further titration of his medications   Recommendations: 1) continue depakotee 2) continue keppra 1500mg  BID 3) continue vimpat.  4) start topamax 100mg  BID 5) Burst suppression again overnight  This patient is critically ill and at significant risk of neurological worsening, death and care requires constant monitoring of vital signs, hemodynamics,respiratory and cardiac monitoring, neurological assessment, discussion with family, other specialists and medical decision making of high complexity. I spent 45 minutes of neurocritical care time  in the care of  this patient.  Roland Rack, MD [facility] [phone]  If 7pm- 7am, please page neurology on call as listed in [place]. 09/14/2016  4:54 PM

## 2016-09-14 NOTE — Procedures (Signed)
History: 30 yo M with status epilepticus, now in induced coma  Sedation: Versed  Technique: This is a 21 channel continuous scalp video-EEG performed at the bedside with bipolar and monopolar montages arranged in accordance to the international 10/20 system of electrode placement. One channel was dedicated to EKG recording.    Background: At onset, the background consists of generalized suppression with bursts consisting of  runs of 2-3 second runs of delta activity with interburst interval of 8 - 10 seconds. Over the course of the day, the background would become more continuous with generalized irregular delta activity. The background would be more continuous following stimulation, with burst suppression resuming after stimulation. Starting around 9 pm, there were occasional generalized epileptiform discharges, but not periodic or persistent. Starting around 5:30 am, these began to appear in trains without evolution and a period of 0.5 - 1Hz .  Photic stimulation: Physiologic driving is not performed  EEG Abnormalities: 1) burst suppression pattern  2) Generalized periodic epileptiform discharges.   Clinical Interpretation: This is consistent with a profound generalized cerebral dysfunction as can be seen in medication induced coma. Following lightening of sedation, there is the appearance of evidence of generalized cortical irritability. Though the pattern does become periodic at times, there is no evolution to suggest recurrent seizure activity.   This would suggest, however, a predisposition to generalized seizures.   Roland Rack, MD [facility] [phone]  If 7pm- 7am, please page neurology on call as listed in [place].

## 2016-09-15 ENCOUNTER — Inpatient Hospital Stay (HOSPITAL_COMMUNITY): Payer: Medicaid Other

## 2016-09-15 DIAGNOSIS — Z8249 Family history of ischemic heart disease and other diseases of the circulatory system: Secondary | ICD-10-CM

## 2016-09-15 DIAGNOSIS — Z881 Allergy status to other antibiotic agents status: Secondary | ICD-10-CM

## 2016-09-15 DIAGNOSIS — R402444 Other coma, without documented Glasgow coma scale score, or with partial score reported, 24 hours or more after hospital admission: Secondary | ICD-10-CM

## 2016-09-15 DIAGNOSIS — Z87891 Personal history of nicotine dependence: Secondary | ICD-10-CM

## 2016-09-15 DIAGNOSIS — Z880 Allergy status to penicillin: Secondary | ICD-10-CM

## 2016-09-15 DIAGNOSIS — Z825 Family history of asthma and other chronic lower respiratory diseases: Secondary | ICD-10-CM

## 2016-09-15 DIAGNOSIS — Z882 Allergy status to sulfonamides status: Secondary | ICD-10-CM

## 2016-09-15 DIAGNOSIS — Z9911 Dependence on respirator [ventilator] status: Secondary | ICD-10-CM

## 2016-09-15 DIAGNOSIS — Z833 Family history of diabetes mellitus: Secondary | ICD-10-CM

## 2016-09-15 DIAGNOSIS — R569 Unspecified convulsions: Secondary | ICD-10-CM

## 2016-09-15 DIAGNOSIS — Z91013 Allergy to seafood: Secondary | ICD-10-CM

## 2016-09-15 DIAGNOSIS — Z886 Allergy status to analgesic agent status: Secondary | ICD-10-CM

## 2016-09-15 DIAGNOSIS — Z823 Family history of stroke: Secondary | ICD-10-CM

## 2016-09-15 DIAGNOSIS — Z888 Allergy status to other drugs, medicaments and biological substances status: Secondary | ICD-10-CM

## 2016-09-15 DIAGNOSIS — M4628 Osteomyelitis of vertebra, sacral and sacrococcygeal region: Secondary | ICD-10-CM

## 2016-09-15 DIAGNOSIS — Z885 Allergy status to narcotic agent status: Secondary | ICD-10-CM

## 2016-09-15 LAB — COMPREHENSIVE METABOLIC PANEL
ALBUMIN: 1.3 g/dL — AB (ref 3.5–5.0)
ALK PHOS: 113 U/L (ref 38–126)
ALT: 9 U/L — AB (ref 17–63)
AST: 16 U/L (ref 15–41)
Anion gap: 8 (ref 5–15)
BILIRUBIN TOTAL: 0.3 mg/dL (ref 0.3–1.2)
BUN: 23 mg/dL — AB (ref 6–20)
CALCIUM: 7.4 mg/dL — AB (ref 8.9–10.3)
CO2: 17 mmol/L — AB (ref 22–32)
CREATININE: 0.62 mg/dL (ref 0.61–1.24)
Chloride: 110 mmol/L (ref 101–111)
GFR calc Af Amer: >60 mL/min (ref >60)
GFR calc non Af Amer: >60 mL/min (ref >60)
GLUCOSE: 129 mg/dL — AB (ref 65–99)
Potassium: 3.8 mmol/L (ref 3.5–5.1)
SODIUM: 135 mmol/L (ref 135–145)
TOTAL PROTEIN: 4.7 g/dL — AB (ref 6.5–8.1)

## 2016-09-15 LAB — CBC WITH DIFFERENTIAL/PLATELET
BASOS PCT: 0 %
Basophils Absolute: 0 10*3/uL (ref 0.0–0.1)
Eosinophils Absolute: 0 10*3/uL (ref 0.0–0.7)
Eosinophils Relative: 0 %
HEMATOCRIT: 21.5 % — AB (ref 39.0–52.0)
HEMOGLOBIN: 7 g/dL — AB (ref 13.0–17.0)
Lymphocytes Relative: 9 %
Lymphs Abs: 2.1 10*3/uL (ref 0.7–4.0)
MCH: 27.2 pg (ref 26.0–34.0)
MCHC: 32.6 g/dL (ref 30.0–36.0)
MCV: 83.7 fL (ref 78.0–100.0)
MONO ABS: 2.5 10*3/uL — AB (ref 0.1–1.0)
MONOS PCT: 11 %
NEUTROS PCT: 80 %
Neutro Abs: 18.5 10*3/uL — ABNORMAL HIGH (ref 1.7–7.7)
Platelets: 348 10*3/uL (ref 150–400)
RBC: 2.57 MIL/uL — AB (ref 4.22–5.81)
RDW: 17.4 % — AB (ref 11.5–15.5)
WBC: 23.1 10*3/uL — AB (ref 4.0–10.5)

## 2016-09-15 LAB — GLUCOSE, CAPILLARY
GLUCOSE-CAPILLARY: 135 mg/dL — AB (ref 65–99)
GLUCOSE-CAPILLARY: 138 mg/dL — AB (ref 65–99)
GLUCOSE-CAPILLARY: 121 mg/dL — AB (ref 65–99)
Glucose-Capillary: 157 mg/dL — ABNORMAL HIGH (ref 65–99)
Glucose-Capillary: 57 mg/dL — ABNORMAL LOW (ref 65–99)
Glucose-Capillary: 112 mg/dL — ABNORMAL HIGH (ref 65–99)
Glucose-Capillary: 193 mg/dL — ABNORMAL HIGH (ref 65–99)

## 2016-09-15 LAB — MAGNESIUM: Magnesium: 1.7 mg/dL (ref 1.7–2.4)

## 2016-09-15 LAB — PHOSPHORUS: PHOSPHORUS: 2.3 mg/dL — AB (ref 2.5–4.6)

## 2016-09-15 LAB — VALPROIC ACID LEVEL: Valproic Acid Lvl: <10 ug/mL — ABNORMAL LOW (ref 50.0–100.0)

## 2016-09-15 MED ORDER — STROKE: EARLY STAGES OF RECOVERY BOOK
Freq: Once | Status: AC
  Administered 2016-09-15: 18:00:00
  Filled 2016-09-15: qty 1

## 2016-09-15 MED ORDER — PROPOFOL 1000 MG/100ML IV EMUL
50.0000 ug/kg/min | INTRAVENOUS | Status: DC
  Administered 2016-09-15: 70 ug/kg/min via INTRAVENOUS
  Administered 2016-09-16 (×4): 50 ug/kg/min via INTRAVENOUS
  Administered 2016-09-17 (×2): 40 ug/kg/min via INTRAVENOUS
  Administered 2016-09-17: 30 ug/kg/min via INTRAVENOUS
  Administered 2016-09-17: 50 ug/kg/min via INTRAVENOUS
  Administered 2016-09-18: 20 ug/kg/min via INTRAVENOUS
  Filled 2016-09-15 (×5): qty 100
  Filled 2016-09-15: qty 200
  Filled 2016-09-15: qty 100

## 2016-09-15 MED ORDER — DEXTROSE 5 % IV SOLN
1000.0000 mg | Freq: Four times a day (QID) | INTRAVENOUS | Status: DC
  Administered 2016-09-15 – 2016-09-18 (×11): 1000 mg via INTRAVENOUS
  Filled 2016-09-15 (×13): qty 10

## 2016-09-15 MED ORDER — SODIUM CHLORIDE 3 % IN NEBU
4.0000 mL | INHALATION_SOLUTION | Freq: Two times a day (BID) | RESPIRATORY_TRACT | Status: AC
  Administered 2016-09-15 – 2016-09-18 (×6): 4 mL via RESPIRATORY_TRACT
  Filled 2016-09-15 (×7): qty 4

## 2016-09-15 MED ORDER — ASPIRIN 325 MG PO TABS
325.0000 mg | ORAL_TABLET | Freq: Every day | ORAL | Status: DC
  Administered 2016-09-15 – 2016-09-18 (×4): 325 mg via ORAL
  Filled 2016-09-15 (×4): qty 1

## 2016-09-15 MED ORDER — DEXTROSE 50 % IV SOLN
INTRAVENOUS | Status: AC
  Administered 2016-09-15: 50 mL
  Filled 2016-09-15: qty 50

## 2016-09-15 MED ORDER — INSULIN GLARGINE 100 UNIT/ML ~~LOC~~ SOLN
5.0000 [IU] | Freq: Every day | SUBCUTANEOUS | Status: DC
  Administered 2016-09-15 – 2016-09-28 (×10): 5 [IU] via SUBCUTANEOUS
  Filled 2016-09-15 (×14): qty 0.05

## 2016-09-15 MED ORDER — CLOPIDOGREL BISULFATE 75 MG PO TABS: 75.0000 mg | ORAL_TABLET | Freq: Every day | ORAL | Status: DC

## 2016-09-15 MED ORDER — MAGNESIUM SULFATE 2 GM/50ML IV SOLN
2.0000 g | Freq: Once | INTRAVENOUS | Status: AC
Start: 2016-09-15 — End: 2016-09-15
  Administered 2016-09-15 (×2): 2 g via INTRAVENOUS
  Filled 2016-09-15: qty 50

## 2016-09-15 MED ORDER — GADOBENATE DIMEGLUMINE 529 MG/ML IV SOLN
10.0000 mL | Freq: Once | INTRAVENOUS | Status: AC | PRN
  Administered 2016-09-15: 10 mL via INTRAVENOUS

## 2016-09-15 NOTE — Progress Notes (Signed)
Name: Jason [name] MRN: [id number] DOB: 1986-02-28    ADMISSION DATE:  08/28/2016 CONSULTATION DATE:  11/21  REFERRING MD :  hongalgi ([facility])   CHIEF COMPLAINT:  Lethargy, vent mgmt   BRIEF PATIENT DESCRIPTION: 30 y.o. quadriplegic r/t C6 spinal cord injury, chronic trach, chronic infections in setting of open wounds sacrum and foot. Started on Azactam without premedication and he has become lethargic and has mild swelling around trach without compromise. Solumedrol has been started and he is HD stable. Mother reports this is his usual reaction to Azactam. PCCM saw 11/21 and signed off.  Called back 11/23 for worsening lethargy and request for vent support.    SIGNIFICANT EVENTS: 11/22 - surgical bx sacral bone/wound 12/04 - transfer to ICU with status epilepticus 12/7- eeg c remains, poor neurostatus  STUDIES:  CT Head w/o 11/21:  Mild atrophy.  No acute intracranial abnormality. Mucosal thickening within the sphenoid sinus. No air-fluid level is seen. 12/6 eeg >>>burst suppression  ANTIBIOTICS:  Per ID Tygacil 11/21 - 11/24 Vancomycin 11/19 - 11/25 Eraxis 11/24 - 11/26 Aztreonam 11/19 - 11/21 Levaquin 11/19 - 11/20 Zyvox 11/19 - 11/24 Imipenem 12/2 >> off seziures Cefepime 12/5>>> Bactrim 12.5>>>  MICROBIOLOGY:  Blood Ctx x3 11/19:  3/3 Streptococcus constellatus  Urine Ctx 11/19:  Acinetobacter & Enterococcus faecalis MRSA PCR 11/20:  Positive Sacral Bone Ctx 11/22:  ESBL Kleb pna, Enterococcus faecalis, strep constellatus Blood Ctx x2 11/23:  Negative  Tracheal Asp Ctx 11/23:  Klebsiella pneumoniae & Acinetobacter Tracheal Asp Ctx 11/24:  Klebsiella pneumoniae & Acinetobacter Tracheal Asp Ctx 11/30:  Pseudomonas aeruginosa & Stenotrophomonas   SUBJECTIVE:   Going to MRI this am Seizures on cont EEG   VITAL SIGNS: Temp:  [97.1 F (36.2 C)-98.8 F (37.1 C)] 98.4 F (36.9 C) (12/09 0849) Pulse Rate:  [73-110] 99 (12/09 1100) Resp:  [14-27] 27 (12/09  1100) BP: (88-123)/(51-80) 109/65 (12/09 1100) SpO2:  [99 %-100 %] 100 % (12/09 1100) FiO2 (%):  [30 %] 30 % (12/09 0737) Weight:  [47.2 kg (104 lb)] 47.2 kg (104 lb) (12/09 0445)  PHYSICAL EXAMINATION: General: some facial movement, neck ,arms HEENT: Tracheostomy clean Cardiovascular:  s1 s2 Regular rhythm Pulmonary:  ronchi rt base Abdomen: Soft. Normal bowel sounds. Nondistended. , colostomy  Neurological:perr, moves etx upper, head, not fc   Recent Labs Lab 09/13/16 0434 09/14/16 0453 09/15/16 0500  NA 136 137  136 135  K 3.1* 4.4  4.3 3.8  CL 110 112*  111 110  CO2 19* 18*  18* 17*  BUN 19 19  19  23*  CREATININE 0.65 0.58*  0.64 0.62  GLUCOSE 156* 91  91 129*    Recent Labs Lab 09/13/16 0434 09/13/16 1456 09/14/16 0453 09/15/16 0500  HGB 6.7* 9.7* 8.5* 7.0*  HCT 20.7* 29.0* 25.8* 21.5*  WBC 12.1*  --  17.2* 23.1*  PLT 329  --  281 348   Dg Chest Port 1 View  Result Date: 09/15/2016 CLINICAL DATA:  Patient with history of pneumonia. Respiratory failure. EXAM: PORTABLE CHEST 1 VIEW COMPARISON:  Chest radiograph 09/14/2016. FINDINGS: Right IJ central venous catheter tip projects over the superior vena cava. Tracheostomy tube stable in position. Stable cardiac and mediastinal contours. Persistent consolidative opacities right mid lower lung. Minimal heterogeneous opacities left lung base. No pleural effusion or pneumothorax. IMPRESSION: Stable support apparatus. Persistent consolidation right lower lung. Unchanged heterogeneous opacities left lung base. Electronically Signed   By: Lovey Newcomer M.D.   On:  09/15/2016 08:07   Dg Chest Port 1 View  Result Date: 09/14/2016 CLINICAL DATA:  Respiratory failure EXAM: PORTABLE CHEST 1 VIEW COMPARISON:  09/12/2016 FINDINGS: Tracheostomy and right central line remain in place, unchanged. Heart is normal size. Bilateral airspace disease, right greater than left, slightly worsened since prior study. Findings concerning for  pneumonia. Possible small bilateral effusions. No acute bony abnormality. IMPRESSION: Worsening patchy bilateral airspace disease, right greater than left concerning for pneumonia. Question small effusions. Electronically Signed   By: Rolm Baptise M.D.   On: 09/14/2016 08:13    ASSESSMENT / PLAN:  NEUROLOGICAL A: Status Epilepticus Acute Encephalopathy - Secondary to seizures. 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P: AEDs Per Neurology:  Keppra, Dilantin, Versed gtT Sedation:  As per Neurology EEG per neuro  Baclofen qid Fentanyl IV prn Pain MRI pending   INFECTIOUS DISEASES A: Sepsis HCAP - Multiple Organisms Recurrent UTI Acute Osteomyelitis - Sacrum. Intra-Muscular Abscesses - R psoas, iliopsoas & iliacus.   P: Cefepime / bactrim started 12/5 ID appreciated sputum cx pending  See pulm  PULMONARY A: Respiratory Alkalosis Chronic Hypoxic Respiratory Failure - S/P Tracheostomy. HCAP   P: Full Vent Support Cont mucomyst,  chest pt, nebs  Repeat pcxr in am   RENAL A: Hypokalemia -resolved  hypomag Chronic Suprapubic Catheter H/O Neurogenic Bladder hypophos-resolved  Hyperchloremia-resolved  P: bmet in am  Cl to follow, may need 1/2 NS Lasix on hold  Replace mag , check in am   CARDIOVASCULAR A: Acute on Chronic Diastolic CHF Acute DVT R Subclavian & Brachial Veins - Noted 11/27 & likely due to PICC that was removed 11/22. Chronic Hypotension  P: Continuous telemetry monitoring Vitals per unit protocol Goal MAP >55, met on own Neo-Synephrine off when prop off Continuing Midodrine TID  GASTROINTESTINAL A: Severe Protein-Calorie Malnutrition H/O Gastroparesis - Due to DM. Lower GI Bleed  P: feeds via peg Protonix VT daily  HEMATOLOGY/ONCOLOGY A: Leukocytosis - Likely due to sepsis. Improving. Anemia - No signs of active bleeding. Known anemia of chronic disease. H/O PE - S/P IVC Filter. 1 unit prbc 12.7 P: Cbc am  SCDs  ENDOCRINE A: H/O DM Type 1 -  Glucose erratic  Hypoglycemia  P: Decrease Lantus 5 units  Accu-Checks to q4hr SSI per Moderate Algorithm   FAMILY UPDATE:  none  Nitasha Jewel NP-C  [facility]  [phone]

## 2016-09-15 NOTE — Progress Notes (Signed)
EEG shows burst suppression pattern, with one burst occurring every 15-20 seconds. Goal is for one burst every 10 seconds.   Propofol being decreased further, to 50 mcg/kg/min.   Will reassess EEG after appropriate time interval.   Electronically signed: Dr. Kerney Elbe

## 2016-09-15 NOTE — Progress Notes (Signed)
[facility] for Infectious Disease  Date of Admission:  08/25/2016   Total days of antibiotics 21        Day 5 cefepime        Day 5 trimethoprim sulfamethoxazole         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 ([place])   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([name])   History of pulmonary embolism   DVT (deep venous thrombosis) ([name])   Severe protein-calorie malnutrition ([place])   Dysphagia, pharyngoesophageal phase   Decubitus ulcer of right perineal ischial region, stage 4 ([place])   Decubitus ulcer of left perineal ischial region, stage 4 ([place])   Sacral decubitus ulcer, stage IV ([name])   Multiple allergies   Recurrent UTI   Respiratory failure, chronic ([name])   Aspiration pneumonia of right lower lobe ([name])   Seizures ([name])   Anxiety state   Anemia of chronic disease   Hyponatremia   Encephalopathy   Other acute osteomyelitis, multiple sites ([place])   Lower GI bleed   Viridans streptococci infection   Coma ([place])   Septic shock ([name])   Anaphylactic syndrome   Acute hypoxemic respiratory failure ([name])   Enterococcal infection   Thrombocytopenia ([name])   Status epilepticus ([name])   Chronic respiratory failure with hypoxia ([name])   Acute respiratory acidosis   . acetylcysteine  4 mL Nebulization BID  . albuterol  2.5 mg Nebulization BID  . artificial tears   Both Eyes Q8H  . baclofen  10 mg Oral QID  . budesonide (PULMICORT) nebulizer solution  0.5 mg Nebulization BID  . chlorhexidine gluconate (MEDLINE KIT)  15 mL Mouth Rinse BID  . insulin aspart  2-6 Units Subcutaneous Q4H  . insulin glargine  5 Units Subcutaneous QHS  . ipratropium  0.5 mg Nebulization BID  . lacosamide (VIMPAT) IV  100 mg Intravenous Q12H  . levETIRAcetam  1,500 mg Intravenous Q12H  . magnesium sulfate 1 - 4 g bolus IVPB  2 g Intravenous Once  . mouth rinse  15 mL  Mouth Rinse 10 times per day  . midazolam  40 mg Intravenous Once  . midodrine  20 mg Per Tube TID WC  . oxybutynin  5 mg Per Tube BID  . pantoprazole sodium  40 mg Per Tube Daily  . scopolamine  1 patch Transdermal Q72H  . topiramate  100 mg Oral BID  . valproate sodium  1,000 mg Intravenous Q8H   Review of Systems: Review of Systems  Unable to perform ROS: Patient unresponsive    Past Medical History:  Diagnosis Date  . Anemia   . Anxiety   . Arthritis   . Asthma   . 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 Colostomy in place [facility])   . Complication of anesthesia    difficulty waking up, will have sudden drop in BP and O2 sats  . Depression   . Diabetic neuropathy ([place])   . Diabetic retinopathy ([place])   . Dysrhythmia    usually due to medications  . Family history of anesthesia complication    Pt mother can't have epidural procedures  . Fibromyalgia   . Gastroparesis   . GERD (gastroesophageal reflux disease)   . H/O constipation   . H/O respiratory failure   . H/O thrombosis   .  Hx MRSA infection    on face  . Multiple allergies 11/02/2015  . Pneumonia   . Recurrent UTI 11/02/2015  . Seizures ([place]) 2017   one time during admission in hosptital  . Stroke ([place]) 01/29/2013   spinal stroke ; paraplegic   . Syncope 02/16/2015  . Tracheostomy present ([place])   . Type I diabetes mellitus ([place])    sees Dr. Loanne Drilling     Social History  Substance Use Topics  . Smoking status: Former Smoker    Packs/day: 1.00    Years: 12.00    Types: Cigars, Cigarettes    Quit date: 09/07/2014  . Smokeless tobacco: Never Used  . Alcohol use No    Family History  Problem Relation Age of Onset  . Diabetes Father   . Hypertension Father   . Heart disease Mother     congenital - MVP  . Asthma      fhx  . Hypertension      fhx  . Stroke      fhx   Allergies  Allergen Reactions  . Amikacin Sulfate Other (See Comments)    Seizure   .  Cefuroxime Axetil Anaphylaxis    Tolerated cefepime 12/2015 - with Pepcid/Solu-Medrol  . Ertapenem Other (See Comments)    Rash and confusion-->tolerated Imipenem   . Imipenem     Possible cause of seizures  . Meropenem Other (See Comments)    Altered mental status, hallucinations  . Morphine And Related Other (See Comments)    Changed mental status, confusion, headache, visual hallucination  . Nsaids Itching and Other (See Comments)    Risk of bleeding, itching  . Penicillins Anaphylaxis and Other (See Comments)    Tolerated Imipenem; no reaction to 7 day course of amoxicillin in 2015 Has patient had a PCN reaction causing immediate rash, facial/tongue/throat swelling, SOB or lightheadedness with hypotension: Yes Has patient had a PCN reaction causing severe rash involving mucus membranes or skin necrosis: No Has patient had a PCN reaction that required hospitalization Yes Has patient had a PCN reaction occurring within the last 10 years: No If all of the above answers are "NO", then may proceed with Cephalo  . Sulfa Antibiotics Anaphylaxis, Shortness Of Breath and Other (See Comments)  . Tessalon [Benzonatate] Anaphylaxis  . Aztreonam Swelling    He appears to tolerate if you premedicate with steroids. Had facial/neck swelling without premedication  . Levaquin [Levofloxacin In D5w] Swelling    Hand and forearm swelling  . Rocephin [Ceftriaxone] Other (See Comments)    Confusion  . Shellfish Allergy Itching and Other (See Comments)    Took benadryl to alleviate reaction    OBJECTIVE: Vitals:   09/15/16 1000 09/15/16 1100 09/15/16 1300 09/15/16 1328  BP: (!) 101/58 109/65  (!) 99/58  Pulse: 94 99  84  Resp: (!) 24 (!) 27  (!) 24  Temp:   (!) 96.7 F (35.9 C)   TempSrc:   Axillary   SpO2: 100% 100%    Weight:      Height:       Body mass index is 15.81 kg/m.  Physical Exam  Constitutional:  He is a medically induced coma on the ventilator. He had more seizure activity  last night.  Cardiovascular: Normal rate and regular rhythm.   Pulmonary/Chest:  Small amount of thick yellow secretions.  Abdominal:  Sacral wound is not examined. Nursing reports that wound is down just over bone.    Lab Results Lab Results  Component Value Date   WBC 23.1 (H) 09/15/2016   HGB 7.0 (L) 09/15/2016   HCT 21.5 (L) 09/15/2016   MCV 83.7 09/15/2016   PLT 348 09/15/2016    Lab Results  Component Value Date   CREATININE 0.62 09/15/2016   BUN 23 (H) 09/15/2016   NA 135 09/15/2016   K 3.8 09/15/2016   CL 110 09/15/2016   CO2 17 (L) 09/15/2016    Lab Results  Component Value Date   ALT 9 (L) 09/15/2016   AST 16 09/15/2016   ALKPHOS 113 09/15/2016   BILITOT 0.3 09/15/2016     Microbiology: Recent Results (from the past 240 hour(s))  Culture, respiratory (NON-Expectorated)     Status: None   Collection Time: 09/06/16 10:36 PM  Result Value Ref Range Status   Specimen Description TRACHEAL ASPIRATE  Final   Special Requests Normal  Final   Gram Stain   Final    MODERATE WBC PRESENT,BOTH PMN AND MONONUCLEAR FEW GRAM NEGATIVE RODS    Culture   Final    ABUNDANT PSEUDOMONAS AERUGINOSA ABUNDANT STENOTROPHOMONAS MALTOPHILIA THE PSEUDOMONAS AERUGINOSA IS A  MULTI-DRUG RESISTANT ORGANISM Results Called to: RN N.WELLINGTON  [id number] MLM    Report Status 09/10/2016 FINAL  Final   Organism ID, Bacteria STENOTROPHOMONAS MALTOPHILIA  Final   Organism ID, Bacteria PSEUDOMONAS AERUGINOSA  Final      Susceptibility   Pseudomonas aeruginosa - MIC*    CEFTAZIDIME 8 SENSITIVE Sensitive     CIPROFLOXACIN >=4 RESISTANT Resistant     GENTAMICIN >=16 RESISTANT Resistant     IMIPENEM >=16 RESISTANT Resistant     PIP/TAZO >=128 RESISTANT Resistant     CEFEPIME 8 SENSITIVE Sensitive     * ABUNDANT PSEUDOMONAS AERUGINOSA   Stenotrophomonas maltophilia - MIC*    LEVOFLOXACIN 1 SENSITIVE Sensitive     TRIMETH/SULFA <=20 SENSITIVE Sensitive     * ABUNDANT  STENOTROPHOMONAS MALTOPHILIA  Culture, respiratory (NON-Expectorated)     Status: None (Preliminary result)   Collection Time: 09/14/16  1:19 PM  Result Value Ref Range Status   Specimen Description TRACHEAL ASPIRATE  Final   Special Requests NONE  Final   Gram Stain   Final    ABUNDANT WBC PRESENT, PREDOMINANTLY PMN RARE GRAM NEGATIVE RODS MODERATE YEAST    Culture CULTURE REINCUBATED FOR BETTER GROWTH  Final   Report Status PENDING  Incomplete     ASSESSMENT: He has recently been treated for polymicrobial sacral osteomyelitis and possible Pseudomonas and stenotrophomonas HCAP. He recently developed status epilepticus and his meropenem was changed to cefepime. He continues to have seizures. His brain MRI does not show any new changes that should cause his seizures. I discussed the situation with Dr. Leonel Ramsay. It is possible that his cefepime is continuing to cause his seizures. I doubt that he has meningitis. At this point I think the lesser evil is to stop his antibiotics to see if his seizures go away. I discussed this recommendation with Adis's mother and she is in agreement. I will also stop his Decadron.  PLAN: 1. Discontinue cefepime, trimethoprim sulfamethoxazole and Decadron  Michel Bickers, MD [facility] [place] [phone] pager   [phone] cell 09/15/2016, 2:46 PM

## 2016-09-15 NOTE — Progress Notes (Signed)
1200 CPT not done due to pt transported to MRI.

## 2016-09-15 NOTE — Procedures (Signed)
History: 30 yo M with status epilepticus, now in induced coma  Sedation: Versed  Technique: This is a 21 channel continuous scalp video-EEG performed at the bedside with bipolar and monopolar montages arranged in accordance to the international 10/20 system of electrode placement. One channel was dedicated to EKG recording.    Background: At onset, the majority of the recording being low amplitude generalized irregular delta activity with occasional runs of 0.5 -1 Hz generalized periodic epileptiform discharges. BEginning around 9 am, these become more continuous with only breif 1 -2 second pauses between GPEDs.  This pattern wasxed and waned in amplitude, but no definite evolution was seen until 9:45 at which point there was a 10 minute long electrographic seizure followed by GPEDs. The GPEDs occur in trians of 2 - 3 discahrges at a frequency of 1 HZ with several seconds between each train. Occasional longer trains (8 - 10 seconds) are seen with accompanying generalized irregular delta associated with them. Another electrographic seizure was seen at 11:32 am and lasting 112 minutes followed by resumption of the previous background. Another seizure was seen at 12:28, 1:50. A burst suppression pattern emerged at 2:30 pm. The bursts last 1 - 3 seconds and contain generalized discharges. There are sevreal runs of generalized contonuous irregular delta activity without rhythmicity or epileptiform morphology that occur following stimulation, but the overall pattern was that of burst-suppression throughout the mainder of the recording.   Photic stimulation: Physiologic driving is not performed  EEG Abnormalities: 1) frequent generalized electrographic seizures as described above which improve with burst suppression 2) Generalized periodic epileptiform discharges.  3) burst suppression pattern   Clinical Interpretation: This EEG recorded multiple generalized electrographic seizures accompanied clinically  with subtle clonic activity.   The GPEDs seen are an indeterminant pattern and can represents cortical irritability, cortical injury, or rarely represent ongoing seizure activity.   Following initiation of propofol sedation, no seizures were seen.   Roland Rack, MD [facility] [phone]  If 7pm- 7am, please page neurology on call as listed in [place].

## 2016-09-15 NOTE — Procedures (Signed)
Pt transported to MRI & back to 2S on vent, no complications.

## 2016-09-15 NOTE — Progress Notes (Signed)
Spoke with Dr. Leonel Ramsay re:  Dosage of versed drip.  Confirmed with Dr. Leonel Ramsay to start Versed 50mg /hr and continue Diprivan 174mcg/hr.

## 2016-09-15 NOTE — Progress Notes (Signed)
Subjective: Intubated   Exam: Vitals:   09/15/16 1540 09/15/16 1612  BP:  117/71  Pulse:  92  Resp:  (!) 25  Temp: (!) 96.8 F (36 C)    Gen: In bed, NAD Resp: non-labored breathing, no acute distress Abd: soft, nt  Neuro: MS: does not respond [id number] reactive but sluggish, face symmetric Motor: no movement  Sensory:as above   Impression: 30 yo M with new onset super refratory generalized status epilepticus([place]). He has been covered for meningitis and ideally I would like to do a lumbar puncture, however reviewing his films he has infection from his sacral source extending up into L1, 2 and I don't think that it would be safe to put a needle through infected tissue to access the CSF space. Also, developing meningitis while on the broad coverage which would have covered the CNS even prior to an developing symptoms would be very unusual. Also with infection, they're typically is focal seizures as well, and his appear to be generalized.  Suspicion therefore I think needs to be geared more towards a toxic/metabolic issue and the goals that has been foremost in my thinking is medication related given that he was on antibiotics which lower seizure threshold.  His mother reports multiple episodes of decreased mental status, obtundation, etc. with beta-lactam antibiotics in the past  and I wonder if he is somewhat exquisitely sensitive to this  Neurotoxicity reaction. Given the cefepime is one agent known to cause neurotoxicity, I discussed with infectious disease possibly holding antibiotics given the severe nature of his current illness. Discussed this with Dr. Megan Salon today who agreed.  Also, he is on relatively high-dose propofol and I will begin adding Versed so that we can lower this dose.  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 I will however, start antiplatelet  therapy.  Recommendations: 1) increase Depakote to 1 g every 6 hours(he has received multiple agents including propofol which speed metabolism and I suspect this is the reason for difficulty obtaining therapeutic levels.) 2) continue keppra 1500mg  BID 3) continue vimpat.  4) continue topamax 100mg  BID 5) continue burst suppression 6) start asa for ischemic stroke 7) LDL, A1C 8) I do not think this is cardioembolic source so no need for echo. Posterior circulation so no need for dopplers.  This patient is critically ill and at significant risk of neurological worsening, death and care requires constant monitoring of vital signs, hemodynamics,respiratory and cardiac monitoring, neurological assessment, discussion with family, other specialists and medical decision making of high complexity. I spent 40 minutes of neurocritical care time  in the care of  this patient.  Roland Rack, MD [facility] [phone]  If 7pm- 7am, please page neurology on call as listed in [place]. 09/15/2016  5:19 PM

## 2016-09-16 LAB — CBC WITH DIFFERENTIAL/PLATELET
BASOS PCT: 0 %
Basophils Absolute: 0 10*3/uL (ref 0.0–0.1)
EOS ABS: 0.2 10*3/uL (ref 0.0–0.7)
Eosinophils Relative: 1 %
HEMATOCRIT: 20.6 % — AB (ref 39.0–52.0)
Hemoglobin: 7 g/dL — ABNORMAL LOW (ref 13.0–17.0)
Lymphocytes Relative: 9 %
Lymphs Abs: 2 10*3/uL (ref 0.7–4.0)
MCH: 28.6 pg (ref 26.0–34.0)
MCHC: 34 g/dL (ref 30.0–36.0)
MCV: 84.1 fL (ref 78.0–100.0)
Monocytes Absolute: 2.2 10*3/uL — ABNORMAL HIGH (ref 0.1–1.0)
Monocytes Relative: 10 %
NEUTROS PCT: 80 %
Neutro Abs: 17.5 10*3/uL — ABNORMAL HIGH (ref 1.7–7.7)
PLATELETS: 403 10*3/uL — AB (ref 150–400)
RBC: 2.45 MIL/uL — AB (ref 4.22–5.81)
RDW: 18.1 % — ABNORMAL HIGH (ref 11.5–15.5)
WBC: 21.9 10*3/uL — ABNORMAL HIGH (ref 4.0–10.5)

## 2016-09-16 LAB — HEMOGLOBIN A1C
Hgb A1c MFr Bld: 6.1 % — ABNORMAL HIGH (ref 4.8–5.6)
Mean Plasma Glucose: 128 mg/dL

## 2016-09-16 LAB — LIPID PANEL
CHOL/HDL RATIO: 6.7 ratio
CHOLESTEROL: 161 mg/dL (ref 0–200)
HDL: 24 mg/dL — AB (ref >40)
LDL Cholesterol: 97 mg/dL (ref 0–99)
TRIGLYCERIDES: 198 mg/dL — AB (ref <150)
VLDL: 40 mg/dL (ref 0–40)

## 2016-09-16 LAB — RENAL FUNCTION PANEL
ANION GAP: 5 (ref 5–15)
Albumin: 1.4 g/dL — ABNORMAL LOW (ref 3.5–5.0)
BUN: 24 mg/dL — ABNORMAL HIGH (ref 6–20)
CHLORIDE: 110 mmol/L (ref 101–111)
CO2: 15 mmol/L — AB (ref 22–32)
Calcium: 7.5 mg/dL — ABNORMAL LOW (ref 8.9–10.3)
Creatinine, Ser: 0.6 mg/dL — ABNORMAL LOW (ref 0.61–1.24)
GFR calc non Af Amer: >60 mL/min (ref >60)
Glucose, Bld: 163 mg/dL — ABNORMAL HIGH (ref 65–99)
Phosphorus: 2.6 mg/dL (ref 2.5–4.6)
Potassium: 3.6 mmol/L (ref 3.5–5.1)
Sodium: 130 mmol/L — ABNORMAL LOW (ref 135–145)

## 2016-09-16 LAB — GLUCOSE, CAPILLARY
GLUCOSE-CAPILLARY: 91 mg/dL (ref 65–99)
GLUCOSE-CAPILLARY: 105 mg/dL — AB (ref 65–99)
Glucose-Capillary: 42 mg/dL — CL (ref 65–99)
Glucose-Capillary: 111 mg/dL — ABNORMAL HIGH (ref 65–99)
Glucose-Capillary: 146 mg/dL — ABNORMAL HIGH (ref 65–99)

## 2016-09-16 LAB — MAGNESIUM
MAGNESIUM: 2.2 mg/dL (ref 1.7–2.4)
Magnesium: 2.2 mg/dL (ref 1.7–2.4)

## 2016-09-16 LAB — VALPROIC ACID LEVEL: VALPROIC ACID LVL: 18 ug/mL — AB (ref 50.0–100.0)

## 2016-09-16 MED ORDER — DEXTROSE 50 % IV SOLN
25.0000 g | Freq: Once | INTRAVENOUS | Status: AC
  Administered 2016-09-16: 25 g via INTRAVENOUS

## 2016-09-16 MED ORDER — DEXTROSE 50 % IV SOLN
INTRAVENOUS | Status: AC
  Filled 2016-09-16: qty 50

## 2016-09-16 MED ORDER — PHENYLEPHRINE HCL 10 MG/ML IJ SOLN
0.0000 ug/min | INTRAMUSCULAR | Status: DC
  Administered 2016-09-16: 30 ug/min via INTRAVENOUS
  Administered 2016-09-17: 50 ug/min via INTRAVENOUS
  Administered 2016-09-18: 50.133 ug/min via INTRAVENOUS
  Administered 2016-09-18: 50 ug/min via INTRAVENOUS
  Administered 2016-09-19: 60 ug/min via INTRAVENOUS
  Administered 2016-09-20: 50 ug/min via INTRAVENOUS
  Administered 2016-09-22: 10 ug/min via INTRAVENOUS
  Administered 2016-09-22: 25 ug/min via INTRAVENOUS
  Administered 2016-09-24: 5 ug/min via INTRAVENOUS
  Administered 2016-09-25: 40 ug/min via INTRAVENOUS
  Administered 2016-09-28: 10 ug/min via INTRAVENOUS
  Filled 2016-09-16 (×16): qty 4

## 2016-09-16 NOTE — Progress Notes (Signed)
PT Cancellation Note  Patient Details Name: Jason [name] MRN: [id number] DOB: 05/01/1986   Cancelled Treatment:    Reason Eval/Treat Not Completed: Patient not medically ready.  Patient sedated on vent.  Will continue to monitor and initiate PT evaluation when appropriate for patient.   Despina Pole 09/16/2016, 7:52 AM Carita Pian. Sanjuana Kava, [place] Pager [phone]

## 2016-09-16 NOTE — Progress Notes (Signed)
Name: Jason [name] MRN: [id number] DOB: [birth year]    ADMISSION DATE:  08/22/2016 CONSULTATION DATE:  11/21  REFERRING MD :  hongalgi ([facility])   CHIEF COMPLAINT:  Lethargy, vent mgmt   BRIEF PATIENT DESCRIPTION: 30 y.o. quadriplegic r/t C6 spinal cord injury, chronic trach, chronic infections in setting of open wounds sacrum and foot. Started on Azactam without premedication and he has become lethargic and has mild swelling around trach without compromise. Solumedrol has been started and he is HD stable. Mother reports this is his usual reaction to Azactam. PCCM saw 11/21 and signed off.  Called back 11/23 for worsening lethargy and request for vent support.    SIGNIFICANT EVENTS: 11/22 - surgical bx sacral bone/wound 12/04 - transfer to ICU with status epilepticus 12/7- eeg c remains, poor neurostatus  STUDIES:  CT Head w/o 11/21:  Mild atrophy.  No acute intracranial abnormality. Mucosal thickening within the sphenoid sinus. No air-fluid level is seen. 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 Small chronic infarct right cerebellum. Mild chronic microvascular ischemic change in the white matter. 12/10 EEG>>>  ANTIBIOTICS:  Per ID Tygacil 11/21 - 11/24 Vancomycin 11/19 - 11/25 Eraxis 11/24 - 11/26 Aztreonam 11/19 - 11/21 Levaquin 11/19 - 11/20 Zyvox 11/19 - 11/24 Imipenem 12/2 >> off seziures Cefepime 12/5>>> Bactrim 12.5>>>  MICROBIOLOGY:  Blood Ctx x3 11/19:  3/3 Streptococcus constellatus  Urine Ctx 11/19:  Acinetobacter & Enterococcus faecalis MRSA PCR 11/20:  Positive Sacral Bone Ctx 11/22:  ESBL Kleb pna, Enterococcus faecalis, strep constellatus Blood Ctx x2 11/23:  Negative  Tracheal Asp Ctx 11/23:  Klebsiella pneumoniae & Acinetobacter Tracheal Asp Ctx 11/24:  Klebsiella pneumoniae & Acinetobacter Tracheal Asp Ctx 11/30:  Pseudomonas aeruginosa & Stenotrophomonas   SUBJECTIVE:   Ongoing seizures on  continuous EEG with slightly longer bursts.  Remains on propofol.   VITAL SIGNS: Temp:  [95.9 F (35.5 C)-97.5 F (36.4 C)] 95.9 F (35.5 C) (12/10 0400) Pulse Rate:  [73-108] 105 (12/10 0805) Resp:  [0-27] 23 (12/10 0805) BP: (83-128)/(45-82) 102/56 (12/10 0805) SpO2:  [100 %] 100 % (12/10 0820) FiO2 (%):  [30 %] 30 % (12/10 0820) Weight:  [49.4 kg (109 lb)] 49.4 kg (109 lb) (12/10 0500)  PHYSICAL EXAMINATION: General: chronically ill appearing young male, some facial movement, neck ,arms HEENT: Tracheostomy clean Cardiovascular:  s1 s2 Regular rhythm Pulmonary: resps even non labored on vent, few scattered ronchi rt base Abdomen: Soft. Normal bowel sounds. Nondistended. , colostomy  Neurological: perr, moves etx upper, head, not fc   Recent Labs Lab 09/14/16 0453 09/15/16 0500 09/16/16 0014  NA 137  136 135 130*  K 4.4  4.3 3.8 3.6  CL 112*  111 110 110  CO2 18*  18* 17* 15*  BUN 19  19 23* 24*  CREATININE 0.58*  0.64 0.62 0.60*  GLUCOSE 91  91 129* 163*    Recent Labs Lab 09/14/16 0453 09/15/16 0500 09/16/16 0500  HGB 8.5* 7.0* 7.0*  HCT 25.8* 21.5* 20.6*  WBC 17.2* 23.1* 21.9*  PLT 281 348 403*   Mr Brain W Wo Contrast  Result Date: 09/15/2016 CLINICAL DATA:  Seizure.  Diabetes. EXAM: MRI HEAD WITHOUT AND WITH CONTRAST TECHNIQUE: Multiplanar, multiecho pulse sequences of the brain and surrounding structures were obtained without and with intravenous contrast. CONTRAST:  77mL MULTIHANCE GADOBENATE DIMEGLUMINE 529 MG/ML IV SOLN COMPARISON:  CT head 09/11/2016 FINDINGS: Brain: Small focus of restricted diffusion in the left  paracentral mid pons compatible with acute infarct. This measures 3 mm in diameter. No other areas of acute infarct. Mild atrophy for age. 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 Small white matter hyperintensities likely due to mild chronic microvascular ischemia. Negative for hemorrhage or mass. Normal enhancement following  contrast infusion. Vascular: Normal arterial flow voids.  Normal venous enhancement. Skull and upper cervical spine: Negative Sinuses/Orbits: Mucosal edema in the paranasal sinuses most notably the sphenoid sinus. Normal orbit. 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 Mild chronic microvascular ischemic change in the white matter. Electronically Signed   By: Franchot Gallo M.D.   On: 09/15/2016 13:10   Dg Chest Port 1 View  Result Date: 09/15/2016 CLINICAL DATA:  Patient with history of pneumonia. Respiratory failure. EXAM: PORTABLE CHEST 1 VIEW COMPARISON:  Chest radiograph 09/14/2016. FINDINGS: Right IJ central venous catheter tip projects over the superior vena cava. Tracheostomy tube stable in position. Stable cardiac and mediastinal contours. Persistent consolidative opacities right mid lower lung. Minimal heterogeneous opacities left lung base. No pleural effusion or pneumothorax. IMPRESSION: Stable support apparatus. Persistent consolidation right lower lung. Unchanged heterogeneous opacities left lung base. Electronically Signed   By: Lovey Newcomer M.D.   On: 09/15/2016 08:07    ASSESSMENT / PLAN:  NEUROLOGICAL A: Status Epilepticus - ongoing  Acute Encephalopathy - Secondary to seizures. 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P: Propofol gtt  AEDs Per Neurology:  Keppra, Dilantin, Versed gtT Continuous EEG per neuro  Baclofen qid Fentanyl IV prn Pain   INFECTIOUS DISEASES A: Sepsis HCAP - Multiple Organisms Recurrent UTI Acute Osteomyelitis - Sacrum. Intra-Muscular Abscesses - R psoas, iliopsoas & iliacus.   P: ID following  abx stopped 12/9 per ID -- ??contributing to seizure sputum cx pending  See pulm  PULMONARY A: Respiratory Alkalosis Chronic Hypoxic Respiratory Failure - S/P Tracheostomy. HCAP   P: Full Vent Support chest pt, nebs  Repeat pcxr in am   RENAL A: Hypokalemia -resolved  hypomag Chronic Suprapubic  Catheter H/O Neurogenic Bladder hypophos-resolved  Hyperchloremia-resolved  P: bmet in am  Lasix on hold  Replete electrolytes PRN  CARDIOVASCULAR A: Acute on Chronic Diastolic CHF Acute DVT R Subclavian & Brachial Veins - Noted 11/27 & likely due to PICC that was removed 11/22. Chronic Hypotension  P: Continuous telemetry monitoring Vitals per unit protocol Goal MAP >55 - needing low dose neo with propofol  Neo-Synephrine off when prop off Continuing Midodrine TID  GASTROINTESTINAL A: Severe Protein-Calorie Malnutrition H/O Gastroparesis - Due to DM. Lower GI Bleed  P: feeds via peg Protonix VT daily  HEMATOLOGY/ONCOLOGY A: Leukocytosis - Likely due to sepsis. Improving. Anemia - No signs of active bleeding. Known anemia of chronic disease. H/O PE - S/P IVC Filter. 1 unit prbc 12.7 P: Cbc am  SCDs  ENDOCRINE A: H/O DM Type 1 - Glucose erratic  Hypoglycemia  P: Lantus 5 units  Accu-Checks to q4hr SSI per Moderate Algorithm   FAMILY UPDATE:  No family available 12/10  Nickolas Madrid, NP 09/16/2016  10:03 AM Pager: [phone] or [phone]

## 2016-09-16 NOTE — Progress Notes (Signed)
Subjective: Intubated   Exam: Vitals:   09/16/16 1000 09/16/16 1010  BP: (!) 108/49 (!) 99/41  Pulse: 90 91  Resp: 18 (!) 21  Temp:     Gen: In bed, NAD Resp: non-labored breathing, no acute distress Abd: soft, nt  Neuro: Exam confounded by induced coma.  MS: does not respond [id number] unreactive, face symmetric Motor: no movement  Sensory:as above   Impression: 30 yo M with new onset super refratory generalized status epilepticus(NORSE). He has been covered for meningitis and ideally I would like to do a lumbar puncture, however reviewing his films he has infection from his sacral source extending up into L1, 2 and I don't think that it would be safe to put a needle through infected tissue to access the CSF space. Also, developing meningitis while on the broad coverage which would have covered the CNS even prior to an developing symptoms would be very unusual. Also with infection, they're typically is focal seizures as well, and his appear to be generalized.  Suspicion therefore I think needs to be geared more towards a toxic/metabolic issue and the goals that has been foremost in my thinking is medication related given that he was on antibiotics which lower seizure threshold.  His mother reports multiple episodes of decreased mental status, obtundation, etc. with beta-lactam antibiotics in the past  and I wonder if he is somewhat exquisitely sensitive to this  Neurotoxicity reaction. Given the cefepime is one agent known to cause neurotoxicity, I discussed with infectious disease possibly holding antibiotics given the severe nature of his current illness. Discussed this with Dr. Megan Salon who agreed.  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 I will however, start antiplatelet therapy.  Recommendations: 1) VPA level, adjust dose accordingly.  2) continue keppra 1500mg  BID 3)  continue vimpat.  4) continue topamax 100mg  BID 5) continue burst suppression 6) continue asa 7) LDL 97, but don't want to add any further medications at the current time, will need statin eventually.    This patient is critically ill and at significant risk of neurological worsening, death and care requires constant monitoring of vital signs, hemodynamics,respiratory and cardiac monitoring, neurological assessment, discussion with family, other specialists and medical decision making of high complexity. I spent 40 minutes of neurocritical care time  in the care of  this patient.  Roland Rack, MD [facility] [phone]  If 7pm- 7am, please page neurology on call as listed in [place]. 09/16/2016  11:24 AM

## 2016-09-16 NOTE — Progress Notes (Signed)
Patient ID: Jason [name], male   DOB: 09-Nov-1985, 30 y.o.   MRN: [id number]          [facility]    Date of Admission:  08/23/2016     Md. remains in a medically induced coma because of his persistent seizures. He has now been off of antibiotics for about 24 hours. His repeat sputum cultures show Pseudomonas and stenotrophomonas again. These may represent colonizers. I will continue observation off of antibiotics for now in case that his recent beta-lactam antibiotics were the trigger for his seizures.         Michel Bickers, MD [facility] [place] [facility] [phone] pager   [phone] cell 10/11/2015, 1:32 PM

## 2016-09-16 NOTE — Procedures (Signed)
History: 30 yo M with status epilepticus, now in induced coma  Sedation: Propofol, Versed  Technique: This is a 21 channel continuous scalp EEG performed at the bedside with bipolar and monopolar montages arranged in accordance to the international 10/20 system of electrode placement. One channel was dedicated to EKG recording. This was a 24 hour recording from 12/09 7:30 am to 12/10 7:30 am.    Background: The background consists of generalized suppression with bursts consisting of 1 - 3 seconds of delta and theta activity. This interburst interval ranges from 5 to 45 seconds(longer is breifly after beginning versed to allow lowering of propofl dose) this then returns to an IBI of  Approximately 10 seconds.   Photic stimulation: Physiologic driving is not performed  EEG Abnormalities: 1) burst suppression pattern   Clinical Interpretation: This is consistent with a profound generalized cerebral dysfunction as can be seen with medication induced coma. No seizures were seen.   Roland Rack, MD [facility] [phone]  If 7pm- 7am, please page neurology on call as listed in [place].

## 2016-09-16 NOTE — Progress Notes (Signed)
EEG now shows slightly longer duration bursts occurring approximately once every 10 seconds. Will continue propofol at current rate of 50 mcg/kg/min. .  Electronically signed: Dr. Kerney Elbe

## 2016-09-17 DIAGNOSIS — I638 Other cerebral infarction: Secondary | ICD-10-CM

## 2016-09-17 LAB — GLUCOSE, CAPILLARY
GLUCOSE-CAPILLARY: 110 mg/dL — AB (ref 65–99)
GLUCOSE-CAPILLARY: 125 mg/dL — AB (ref 65–99)
GLUCOSE-CAPILLARY: 190 mg/dL — AB (ref 65–99)
GLUCOSE-CAPILLARY: 96 mg/dL (ref 65–99)
Glucose-Capillary: 243 mg/dL — ABNORMAL HIGH (ref 65–99)

## 2016-09-17 LAB — CBC WITH DIFFERENTIAL/PLATELET
Basophils Absolute: 0 10*3/uL (ref 0.0–0.1)
Basophils Relative: 0 %
EOS ABS: 0.2 10*3/uL (ref 0.0–0.7)
Eosinophils Relative: 1 %
HCT: 19.4 % — ABNORMAL LOW (ref 39.0–52.0)
Hemoglobin: 6.2 g/dL — CL (ref 13.0–17.0)
LYMPHS PCT: 10 %
Lymphs Abs: 2 10*3/uL (ref 0.7–4.0)
MCH: 27.7 pg (ref 26.0–34.0)
MCHC: 32 g/dL (ref 30.0–36.0)
MCV: 86.6 fL (ref 78.0–100.0)
MONO ABS: 2.2 10*3/uL — AB (ref 0.1–1.0)
Monocytes Relative: 11 %
Neutro Abs: 15.5 10*3/uL — ABNORMAL HIGH (ref 1.7–7.7)
Neutrophils Relative %: 78 %
PLATELETS: 453 10*3/uL — AB (ref 150–400)
RBC: 2.24 MIL/uL — AB (ref 4.22–5.81)
RDW: 18.1 % — AB (ref 11.5–15.5)
WBC: 19.9 10*3/uL — ABNORMAL HIGH (ref 4.0–10.5)

## 2016-09-17 LAB — RENAL FUNCTION PANEL
ALBUMIN: 1.3 g/dL — AB (ref 3.5–5.0)
Anion gap: 7 (ref 5–15)
BUN: 22 mg/dL — AB (ref 6–20)
CO2: 15 mmol/L — AB (ref 22–32)
Calcium: 7.4 mg/dL — ABNORMAL LOW (ref 8.9–10.3)
Chloride: 116 mmol/L — ABNORMAL HIGH (ref 101–111)
Creatinine, Ser: 0.54 mg/dL — ABNORMAL LOW (ref 0.61–1.24)
GFR calc Af Amer: >60 mL/min (ref >60)
GFR calc non Af Amer: >60 mL/min (ref >60)
GLUCOSE: 171 mg/dL — AB (ref 65–99)
PHOSPHORUS: 1.9 mg/dL — AB (ref 2.5–4.6)
POTASSIUM: 3.8 mmol/L (ref 3.5–5.1)
Sodium: 138 mmol/L (ref 135–145)

## 2016-09-17 LAB — PREPARE RBC (CROSSMATCH)

## 2016-09-17 LAB — MAGNESIUM: MAGNESIUM: 2 mg/dL (ref 1.7–2.4)

## 2016-09-17 LAB — HEMOGLOBIN A1C
HEMOGLOBIN A1C: 6.1 % — AB (ref 4.8–5.6)
Mean Plasma Glucose: 128 mg/dL

## 2016-09-17 MED ORDER — SODIUM CHLORIDE 0.9 % IV SOLN
Freq: Once | INTRAVENOUS | Status: AC
  Administered 2016-09-17: 10 mL/h via INTRAVENOUS

## 2016-09-17 NOTE — Progress Notes (Signed)
Name: Jason [name] MRN: [id number] DOB: [birth year]    ADMISSION DATE:  08/24/2016 CONSULTATION DATE:  11/21  REFERRING MD :  hongalgi ([facility])   CHIEF COMPLAINT:  Lethargy, vent mgmt   BRIEF PATIENT DESCRIPTION:   30 yo male developed altered mental status after receiving azactam in setting of chronic wound infections (sacrum/heels).  He has hx of C6 spine injury with quadriplegia, tracheostomy status.  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  VITAL SIGNS: BP (!) 101/56   Pulse 98   Temp (!) 101.3 F (38.5 C) (Oral)   Resp 18   Ht 5\' 8"  (1.727 m)   Wt 106 lb (48.1 kg)   SpO2 100%   BMI 16.12 kg/m   INTAKE/OUTPUT: I/O last 3 completed shifts: In: 6612.7 [I.V.:4242.7; Other:150; NG/GT:2010; IV Piggyback:210] Out: [id number] [Urine:3350; Drains:265; Stool:2200]   PHYSICAL EXAMINATION: General: chronically ill appearing young male, some facial movement, neck ,arms HEENT: Tracheostomy clean Cardiovascular:  s1 s2 Regular rhythm Pulmonary: resps even non labored on vent, few scattered ronchi rt base Abdomen: Soft. Normal bowel sounds. Nondistended. , colostomy  Neurological: perr, moves etx upper, head, not fc  CMP Latest Ref Rng &  Units 09/17/2016 09/16/2016 09/15/2016  Glucose 65 - 99 mg/dL 171(H) 163(H) 129(H)  BUN 6 - 20 mg/dL 22(H) 24(H) 23(H)  Creatinine 0.61 - 1.24 mg/dL 0.54(L) 0.60(L) 0.62  Sodium 135 - 145 mmol/L 138 130(L) 135  Potassium 3.5 - 5.1 mmol/L 3.8 3.6 3.8  Chloride 101 - 111 mmol/L 116(H) 110 110  CO2 22 - 32 mmol/L 15(L) 15(L) 17(L)  Calcium 8.9 - 10.3 mg/dL 7.4(L) 7.5(L) 7.4(L)  Total Protein 6.5 - 8.1 g/dL - - 4.7(L)  Total Bilirubin 0.3 - 1.2 mg/dL - - 0.3  Alkaline Phos 38 - 126 U/L - - 113  AST 15 - 41 U/L - - 16  ALT 17 - 63 U/L - - 9(L)    CBC Latest Ref Rng & Units 09/17/2016 09/16/2016 09/15/2016  WBC 4.0 - 10.5 K/uL 19.9(H) 21.9(H) 23.1(H)  Hemoglobin 13.0 - 17.0 g/dL 6.2(LL) 7.0(L) 7.0(L)  Hematocrit 39.0 - 52.0 % 19.4(L) 20.6(L) 21.5(L)  Platelets 150 - 400 K/uL 453(H) 403(H) 348    Imaging: Mr Jeri Cos Wo Contrast  Result Date: 09/15/2016 CLINICAL DATA:  Seizure.  Diabetes. EXAM: MRI HEAD WITHOUT AND WITH CONTRAST TECHNIQUE: Multiplanar, multiecho pulse sequences of the brain and surrounding structures were obtained without and with intravenous contrast. CONTRAST:  7mL MULTIHANCE GADOBENATE DIMEGLUMINE 529 MG/ML IV SOLN COMPARISON:  CT head 09/11/2016 FINDINGS: Brain: Small focus of restricted diffusion in the left paracentral mid pons compatible with acute infarct. This measures 3 mm in diameter. No other areas of acute infarct. Mild atrophy for age. 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 Small white matter hyperintensities likely  due to mild chronic microvascular ischemia. Negative for hemorrhage or mass. Normal enhancement following contrast infusion. Vascular: Normal arterial flow voids.  Normal venous enhancement. Skull and upper cervical spine: Negative Sinuses/Orbits: Mucosal edema in the paranasal sinuses most notably the sphenoid sinus. Normal orbit. 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 Mild chronic  microvascular ischemic change in the white matter. Electronically Signed   By: Franchot Gallo M.D.   On: 09/15/2016 13:10    ASSESSMENT / PLAN:  Status epilepticus >> possibly related to Abx. 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 - AEDs per neurology - neurology arrange for cervical puncture for CSF sampling - continue diprivan per neurology  Acute on chronic respiratory failure. Tracheostomy status. - full vent support  - f/u CXR  HCAP, recurrent UTI, osteomyelitis, sacral/heel wounds. - monitor off Abx - ID following  Anemia of critical illness. - f/u CBC after transfusion 12/11  Rt subclavian/brachial DVT 2nd to PICC line 11/27. Hx of PE s/p IVC filter. Chronic diastolic CHF. Hypotension 2nd to sedation. - pressors to keep MAP > 55 - midodrine  Severe protein calorie malnutrition. DM gastroparesis. - tube feeds  DM type 1. - SSI  D/w Dr. Leonel Ramsay  CC time 57 minutes  Chesley Mires, MD [facility] 09/17/2016, 9:30 AM Pager:  [phone] After 3pm call: [phone]

## 2016-09-17 NOTE — Progress Notes (Signed)
INFECTIOUS DISEASE PROGRESS NOTE  ID: Jason [name] is a 30 y.o. 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)   History of pulmonary embolism   DVT (deep venous thrombosis) ([name])   Severe protein-calorie malnutrition ([place])   Dysphagia, pharyngoesophageal phase   Decubitus ulcer of right perineal ischial region, stage 4 ([place])   Decubitus ulcer of left perineal ischial region, stage 4 ([place])   Sacral decubitus ulcer, stage IV ([name])   Multiple allergies   Recurrent UTI   Respiratory failure, chronic ([place])   Aspiration pneumonia of right lower lobe ([name])   Seizures ([name])   Anxiety state   Anemia of chronic disease   Hyponatremia   Encephalopathy   Other acute osteomyelitis, multiple sites ([place])   Lower GI bleed   Viridans streptococci infection   Coma ([place])   Septic shock ([name])   Anaphylactic syndrome   Acute hypoxemic respiratory failure ([place])   Enterococcal infection   Thrombocytopenia ([name])   Status epilepticus ([name])   Chronic respiratory failure with hypoxia ([name])   Acute respiratory acidosis  Subjective: Continued sedation No response  Abtx:  Anti-infectives    Start     Dose/Rate Route Frequency Ordered Stop   09/11/16 2200  ceFEPIme (MAXIPIME) 2 g in dextrose 5 % 50 mL IVPB  Status:  Discontinued     2 g 100 mL/hr over 30 Minutes Intravenous Every 8 hours 09/11/16 1830 09/11/16 1838   09/11/16 2200  ceFEPIme (MAXIPIME) 2 g in dextrose 5 % 50 mL IVPB  Status:  Discontinued     2 g 100 mL/hr over 30 Minutes Intravenous Every 8 hours 09/11/16 1838 09/15/16 1441   09/11/16 2000  ceFEPIme (MAXIPIME) 2 g in dextrose 5 % 50 mL IVPB  Status:  Discontinued     2 g 100 mL/hr over 30 Minutes Intravenous Every 8 hours 09/11/16 1813 09/11/16 1830   09/11/16 1400  meropenem (MERREM)  1 g in sodium chloride 0.9 % 100 mL IVPB  Status:  Discontinued     1 g 200 mL/hr over 30 Minutes Intravenous Every 8 hours 09/11/16 1150 09/11/16 1813   09/11/16 1400  sulfamethoxazole-trimethoprim (BACTRIM) 240 mg of trimethoprim in dextrose 5 % 250 mL IVPB  Status:  Discontinued     240 mg of trimethoprim 265 mL/hr over 60 Minutes Intravenous Every 8 hours 09/11/16 1156 09/15/16 1441   09/10/16 2100  levofloxacin (LEVAQUIN) IVPB 750 mg  Status:  Discontinued     750 mg 100 mL/hr over 90 Minutes Intravenous Every 24 hours 09/10/16 1849 09/11/16 1115   09/10/16 2000  ceFEPIme (MAXIPIME) 2 g in dextrose 5 % 50 mL IVPB  Status:  Discontinued     2 g 100 mL/hr over 30 Minutes Intravenous Every 8 hours 09/10/16 1847 09/11/16 1140   09/08/16 1700  imipenem-cilastatin (PRIMAXIN) 1,000 mg in sodium chloride 0.9 % 250 mL IVPB  Status:  Discontinued     1,000 mg 250 mL/hr over 60 Minutes Intravenous Every 8 hours 09/08/16 1159 09/10/16 1736   09/01/16 1300  anidulafungin (ERAXIS) 100 mg in sodium chloride 0.9 % 100 mL IVPB  Status:  Discontinued     100 mg over 90 Minutes Intravenous Every 24 hours 08/31/16 1240 09/02/16 1252   09/01/16 1200  vancomycin (VANCOCIN) IVPB 750 mg/150 ml premix  Status:  Discontinued     750 mg 150 mL/hr over 60 Minutes Intravenous Every 24 hours 09/01/16 1145 09/01/16 1712   08/31/16 1700  imipenem-cilastatin (PRIMAXIN) 500 mg in sodium chloride 0.9 % 100 mL IVPB  Status:  Discontinued     500 mg 200 mL/hr over 30 Minutes Intravenous Every 8 hours 08/31/16 1240 09/08/16 1159   08/31/16 1300  anidulafungin (ERAXIS) 200 mg in sodium chloride 0.9 % 200 mL IVPB     200 mg over 180 Minutes Intravenous  Once 08/31/16 1240 08/31/16 1810   08/28/2016 0557  tigecycline (TYGACIL) 50 mg in sodium chloride 0.9 % 100 mL IVPB  Status:  Discontinued     50 mg 200 mL/hr over 30 Minutes Intravenous Every 12 hours 08/28/16 1758 08/31/16 1151   08/28/16 1800  tigecycline (TYGACIL) 100  mg in sodium chloride 0.9 % 100 mL IVPB  Status:  Discontinued     100 mg 200 mL/hr over 30 Minutes Intravenous  Once 08/28/16 1758 08/31/16 1424   08/28/16 1300  linezolid (ZYVOX) IVPB 600 mg  Status:  Discontinued     600 mg 300 mL/hr over 60 Minutes Intravenous Every 12 hours 08/28/16 1239 08/31/16 2014   08/28/16 0200  aztreonam (AZACTAM) 1 g in dextrose 5 % 50 mL IVPB  Status:  Discontinued     1 g 100 mL/hr over 30 Minutes Intravenous Every 8 hours 08/27/16 1802 08/28/16 1232   08/27/16 2000  vancomycin (VANCOCIN) 500 mg in sodium chloride 0.9 % 100 mL IVPB  Status:  Discontinued     500 mg 100 mL/hr over 60 Minutes Intravenous Every 12 hours 08/27/16 1802 08/28/16 1239   08/27/16 1800  aztreonam (AZACTAM) 2 g in dextrose 5 % 50 mL IVPB     2 g 100 mL/hr over 30 Minutes Intravenous  Once 08/27/16 1634 08/27/16 1852   08/27/16 1600  levofloxacin (LEVAQUIN) IVPB 750 mg  Status:  Discontinued     750 mg 100 mL/hr over 90 Minutes Intravenous Every 24 hours 08/19/2016 1324 08/15/2016 1522   08/27/16 0000  vancomycin (VANCOCIN) 500 mg in sodium chloride 0.9 % 100 mL IVPB  Status:  Discontinued     500 mg 100 mL/hr over 60 Minutes Intravenous Every 12 hours 08/31/2016 1324 08/21/2016 1522   09/01/2016 2200  aztreonam (AZACTAM) 1 g in dextrose 5 % 50 mL IVPB  Status:  Discontinued     1 g 100 mL/hr over 30 Minutes Intravenous Every 8 hours 08/22/2016 1324 08/28/2016 1522   08/31/2016 1600  linezolid (ZYVOX) tablet 600 mg  Status:  Discontinued     600 mg Oral Every 12 hours 08/18/2016 1458 08/27/16 1347   08/25/2016 1300  levofloxacin (LEVAQUIN) IVPB 750 mg  Status:  Discontinued     750 mg 100 mL/hr over 90 Minutes Intravenous  Once 09/06/2016 1246 08/25/2016 1522   08/17/2016 1300  aztreonam (AZACTAM) 2 g in dextrose 5 % 50 mL IVPB  Status:  Discontinued     2 g 100 mL/hr over 30 Minutes Intravenous  Once 08/16/2016 1246 08/20/2016 1522   08/18/2016 1300  vancomycin (VANCOCIN) IVPB 1000 mg/200 mL premix  Status:   Discontinued     1,000 mg 200 mL/hr over 60 Minutes Intravenous  Once 08/17/2016 1246 08/09/2016 1522      Medications:  Scheduled: . artificial tears   Both Eyes Q8H  . aspirin  325 mg Oral Daily  .  baclofen  10 mg Oral QID  . budesonide (PULMICORT) nebulizer solution  0.5 mg Nebulization BID  . chlorhexidine gluconate (MEDLINE KIT)  15 mL Mouth Rinse BID  . insulin aspart  2-6 Units Subcutaneous Q4H  . insulin glargine  5 Units Subcutaneous QHS  . ipratropium  0.5 mg Nebulization BID  . lacosamide (VIMPAT) IV  100 mg Intravenous Q12H  . levETIRAcetam  1,500 mg Intravenous Q12H  . mouth rinse  15 mL Mouth Rinse 10 times per day  . midazolam  40 mg Intravenous Once  . midodrine  20 mg Per Tube TID WC  . oxybutynin  5 mg Per Tube BID  . pantoprazole sodium  40 mg Per Tube Daily  . scopolamine  1 patch Transdermal Q72H  . sodium chloride HYPERTONIC  4 mL Nebulization Q12H  . topiramate  100 mg Oral BID  . valproate sodium  1,000 mg Intravenous Q6H    Objective: Vital signs in last 24 hours: Temp:  [97.8 F (36.6 C)-102.6 F (39.2 C)] 101.3 F (38.5 C) (12/11 0745) Pulse Rate:  [82-117] 98 (12/11 0800) Resp:  [1-32] 18 (12/11 0945) BP: (86-116)/(41-74) 90/48 (12/11 0945) SpO2:  [99 %-100 %] 100 % (12/11 0800) FiO2 (%):  [30 %] 30 % (12/11 0800) Weight:  [48.1 kg (106 lb)] 48.1 kg (106 lb) (12/11 0600)   General appearance: no distress Resp: diminished breath sounds bilaterally Cardio: regular rate and rhythm GI: abnormal findings:  hypoactive bowel sounds  Lab Results  Recent Labs  09/16/16 0014 09/16/16 0500 09/17/16 0354  WBC  --  21.9* 19.9*  HGB  --  7.0* 6.2*  HCT  --  20.6* 19.4*  NA 130*  --  138  K 3.6  --  3.8  CL 110  --  116*  CO2 15*  --  15*  BUN 24*  --  22*  CREATININE 0.60*  --  0.54*   Liver Panel  Recent Labs  09/15/16 0500 09/16/16 0014 09/17/16 0354  PROT 4.7*  --   --   ALBUMIN 1.3* 1.4* 1.3*  AST 16  --   --   ALT 9*  --   --     ALKPHOS 113  --   --   BILITOT 0.3  --   --    Sedimentation Rate No results for input(s): ESRSEDRATE in the last 72 hours. C-Reactive Protein No results for input(s): CRP in the last 72 hours.  Microbiology: Recent Results (from the past 240 hour(s))  Culture, respiratory (NON-Expectorated)     Status: None (Preliminary result)   Collection Time: 09/14/16  1:19 PM  Result Value Ref Range Status   Specimen Description TRACHEAL ASPIRATE  Final   Special Requests NONE  Final   Gram Stain   Final    ABUNDANT WBC PRESENT, PREDOMINANTLY PMN RARE GRAM NEGATIVE RODS MODERATE YEAST    Culture   Final    ABUNDANT PSEUDOMONAS AERUGINOSA MODERATE STENOTROPHOMONAS MALTOPHILIA SUSCEPTIBILITIES TO FOLLOW    Report Status PENDING  Incomplete    Studies/Results: Mr Jeri Cos Wo Contrast  Result Date: 09/15/2016 CLINICAL DATA:  Seizure.  Diabetes. EXAM: MRI HEAD WITHOUT AND WITH CONTRAST TECHNIQUE: Multiplanar, multiecho pulse sequences of the brain and surrounding structures were obtained without and with intravenous contrast. CONTRAST:  62m MULTIHANCE GADOBENATE DIMEGLUMINE 529 MG/ML IV SOLN COMPARISON:  CT head 09/11/2016 FINDINGS: Brain: Small focus of restricted diffusion in the left paracentral mid pons compatible with acute infarct. This measures 3 mm  in diameter. No other areas of acute infarct. Mild atrophy for age. 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 Small white matter hyperintensities likely due to mild chronic microvascular ischemia. Negative for hemorrhage or mass. Normal enhancement following contrast infusion. Vascular: Normal arterial flow voids.  Normal venous enhancement. Skull and upper cervical spine: Negative Sinuses/Orbits: Mucosal edema in the paranasal sinuses most notably the sphenoid sinus. Normal orbit. 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 Mild chronic microvascular ischemic change in the white  matter. Electronically Signed   By: Franchot Gallo M.D.   On: 09/15/2016 13:10     Assessment/Plan: C6 Spinal Infarct Quadraplegia Sepsis Streptococcal constellatus bacteremia Sacral Osteomyelitis Bone Bx 11-22 ESBL klebsiella Enterococcus (S- amp) Strep constellatus HCAP, aspiration Chronic Trach CXR worsening vascular congestion.    11-23Acinetobacter, Klebsiella 11-30 stenotrophomonas, pseudomonas Status epiliepticus RUE DVT Infarct of L paracentral pons on MRI  Total days of antibiotics: 18 --> off 12-9 12-4 cefepime, bactrim >> 12-9 Imipenem 12-2 >>12-4 Tygacil 11/21 - 11/24 Vancomycin 11/19 - 11/25 Eraxis 11/24 - 11/26 Aztreonam 11/19 - 11/21 Levaquin 11/19 - 11/20 Zyvox 11/19 - 11/24  Continues to have fever, wbc improving.  Watch for response as sedatives are removed.  continue to hold anbx Await LP, CSF HSV    [name] Infectious Diseases (pager) [phone] [email or web address] 09/17/2016, 9:55 AM  LOS: 22 days

## 2016-09-17 NOTE — Progress Notes (Signed)
PT Cancellation Note  Patient Details Name: Jason [name] MRN: [id number] DOB: Feb 09, 1986   Cancelled Treatment:    Reason Eval/Treat Not Completed: Patient not medically ready. Pt remains sedated and on the vent. PT to return as appropriate, as able.   Colten Desroches M Mayo Owczarzak 09/17/2016, 9:33 AM   Kittie Plater, PT, DPT Pager #: [phone] Office #: [phone]

## 2016-09-17 NOTE — Progress Notes (Signed)
[facility] Progress Note Patient Name: Jason [name] DOB: 1986-08-08 MRN: [id number]   Date of Service  09/17/2016  HPI/Events of Note  Anemia  Recent Labs Lab 09/15/16 0500 09/16/16 0500 09/17/16 0354  HGB 7.0* 7.0* 6.2*  HCT 21.5* 20.6* 19.4*  WBC 23.1* 21.9* 19.9*  PLT 348 403* 453*     eICU Interventions  1u PRBC transfused.      Intervention Category Major Interventions: Other:  Iyana Topor S. 09/17/2016, 4:41 AM

## 2016-09-17 NOTE — Progress Notes (Signed)
EEG maint complete. electrode site FP2 redness. Moved slightly. Moved EKG leads

## 2016-09-17 NOTE — Progress Notes (Signed)
Occupational Therapy Discharge Patient Details Name: Jason [name] MRN: [id number] DOB: [birth year] Today's Date: 09/17/2016 Time:  -     Patient discharged from OT services secondary to medical decline - will need to re-order OT to resume therapy services.  Please see latest therapy progress note for current level of functioning and progress toward goals.    Progress and discharge plan discussed with patient and/or caregiver: MD discontinued orders.  Please reorder if pt becomes medically appropriate.  Thank you.     [place], OTR/L [id number]  Lucille Passy M 09/17/2016, 9:57 AM

## 2016-09-17 NOTE — Consult Note (Addendum)
WOC wound follow up:  Consult requested to re-assess multiple wounds. Consult was previously performed 11/22. Wound type/Measurement: Left ischium: Stage 4 pressure injury 7cm x 5cm x 2.5cm with 3cm undermining at 6:00 o'clock, beefy red with small amt pink drainage, no odor, bone palpable Right ischium: Stage 4 pressure injury: 8.5cm x 6.5cm x 2 cm with 3 cm tunneling at 12:00 o'clock, beefy red with mod amt amt pink drainage, no odor, bone palpable  Sacrum: Stage 4 pressure injury 7 cm x 10 cm x 2cm, tunneling to 8 cm at 1:00 o'clock, beefy red, mod amt pink drainage, no odor, bone palpable  Left inner thigh with healing full thickness wound; 2X2X.1cm, pink and dry Left heel with unstageable pressure injury; 2X1cm, dry eschar without odor or drainage Right elbow with stage 2 pressure injury; 1X1X.1cm, pink and dry Middle back with healing full thickness wound; 3X3X.1cm, pink and dry All pressure injuries were present on admission. Dressing procedure/placement/frequency: Pt is on an air mattress to decrease pressure, and is wearing bilat Prevalon boots.  Ostomy pouch intact with good seal, mod amt brown semiformed stool. Pouching supplies left at bedside for staff nurse use.  Continue present plan of care with foam dressings to back, left thigh, right elbow, and left heel. Continue moist gauze to left and right ischium and sacrum. Mother is very familiar with topical treatments, Mom and Dad were at the bedside to assess wound appearance and review plan of care and denied further questions. This consult took one hour to perform. Please re-consult if further assistance is needed.  Thank-you,  Julien Girt MSN, [place], [place], [place], [place]

## 2016-09-18 ENCOUNTER — Inpatient Hospital Stay (HOSPITAL_COMMUNITY): Payer: Medicaid Other

## 2016-09-18 LAB — TYPE AND SCREEN
ABO/RH(D): O POS
ANTIBODY SCREEN: NEGATIVE
UNIT DIVISION: 0

## 2016-09-18 LAB — GLUCOSE, CAPILLARY
GLUCOSE-CAPILLARY: 100 mg/dL — AB (ref 65–99)
GLUCOSE-CAPILLARY: 127 mg/dL — AB (ref 65–99)
GLUCOSE-CAPILLARY: 27 mg/dL — AB (ref 65–99)
Glucose-Capillary: 122 mg/dL — ABNORMAL HIGH (ref 65–99)
Glucose-Capillary: 163 mg/dL — ABNORMAL HIGH (ref 65–99)
Glucose-Capillary: 165 mg/dL — ABNORMAL HIGH (ref 65–99)
Glucose-Capillary: 288 mg/dL — ABNORMAL HIGH (ref 65–99)
Glucose-Capillary: 75 mg/dL (ref 65–99)
Glucose-Capillary: 84 mg/dL (ref 65–99)

## 2016-09-18 LAB — CBC WITH DIFFERENTIAL/PLATELET
BASOS ABS: 0 10*3/uL (ref 0.0–0.1)
BASOS PCT: 0 %
Eosinophils Absolute: 0.1 10*3/uL (ref 0.0–0.7)
Eosinophils Relative: 1 %
HEMATOCRIT: 25.2 % — AB (ref 39.0–52.0)
HEMOGLOBIN: 8.1 g/dL — AB (ref 13.0–17.0)
Lymphocytes Relative: 10 %
Lymphs Abs: 2.3 10*3/uL (ref 0.7–4.0)
MCH: 28 pg (ref 26.0–34.0)
MCHC: 32.1 g/dL (ref 30.0–36.0)
MCV: 87.2 fL (ref 78.0–100.0)
MONOS PCT: 9 %
Monocytes Absolute: 1.9 10*3/uL — ABNORMAL HIGH (ref 0.1–1.0)
NEUTROS ABS: 17.5 10*3/uL — AB (ref 1.7–7.7)
Neutrophils Relative %: 80 %
Platelets: 490 10*3/uL — ABNORMAL HIGH (ref 150–400)
RBC: 2.89 MIL/uL — ABNORMAL LOW (ref 4.22–5.81)
RDW: 18.2 % — ABNORMAL HIGH (ref 11.5–15.5)
WBC: 21.8 10*3/uL — ABNORMAL HIGH (ref 4.0–10.5)

## 2016-09-18 LAB — RENAL FUNCTION PANEL
ALBUMIN: 1.2 g/dL — AB (ref 3.5–5.0)
ANION GAP: 5 (ref 5–15)
BUN: 22 mg/dL — ABNORMAL HIGH (ref 6–20)
CALCIUM: 7.6 mg/dL — AB (ref 8.9–10.3)
CO2: 15 mmol/L — ABNORMAL LOW (ref 22–32)
Chloride: 123 mmol/L — ABNORMAL HIGH (ref 101–111)
Creatinine, Ser: 0.5 mg/dL — ABNORMAL LOW (ref 0.61–1.24)
Glucose, Bld: 127 mg/dL — ABNORMAL HIGH (ref 65–99)
PHOSPHORUS: 1.7 mg/dL — AB (ref 2.5–4.6)
Potassium: 3.5 mmol/L (ref 3.5–5.1)
SODIUM: 143 mmol/L (ref 135–145)

## 2016-09-18 LAB — PROTIME-INR
INR: 1.19
PROTHROMBIN TIME: 15.2 s (ref 11.4–15.2)

## 2016-09-18 LAB — MAGNESIUM: Magnesium: 1.9 mg/dL (ref 1.7–2.4)

## 2016-09-18 LAB — VALPROIC ACID LEVEL: Valproic Acid Lvl: 43 ug/mL — ABNORMAL LOW (ref 50.0–100.0)

## 2016-09-18 MED ORDER — VITAL AF 1.2 CAL PO LIQD
1000.0000 mL | ORAL | Status: DC
  Administered 2016-09-18 – 2016-09-29 (×13): 1000 mL

## 2016-09-18 MED ORDER — DEXTROSE 5 % IV SOLN
1250.0000 mg | Freq: Four times a day (QID) | INTRAVENOUS | Status: DC
  Administered 2016-09-18 – 2016-09-25 (×28): 1250 mg via INTRAVENOUS
  Filled 2016-09-18 (×29): qty 12.5

## 2016-09-18 MED ORDER — DEXTROSE 10 % IV SOLN
INTRAVENOUS | Status: DC | PRN
  Administered 2016-09-18 – 2016-09-21 (×2): via INTRAVENOUS

## 2016-09-18 MED ORDER — DEXTROSE 50 % IV SOLN
INTRAVENOUS | Status: AC
  Administered 2016-09-18: 50 mL via INTRAVENOUS
  Filled 2016-09-18: qty 50

## 2016-09-18 MED ORDER — POTASSIUM & SODIUM PHOSPHATES 280-160-250 MG PO PACK
1.0000 | PACK | ORAL | Status: AC
  Administered 2016-09-18 (×2): 1
  Filled 2016-09-18 (×2): qty 1

## 2016-09-18 MED ORDER — VALPROATE SODIUM 500 MG/5ML IV SOLN
1000.0000 mg | Freq: Once | INTRAVENOUS | Status: AC
  Administered 2016-09-18: 1000 mg via INTRAVENOUS
  Filled 2016-09-18: qty 10

## 2016-09-18 NOTE — Progress Notes (Addendum)
INFECTIOUS DISEASE PROGRESS NOTE  ID: Jason [name] is a 30 y.o. male with  Active Problems:   Gastroparesis due to DM ([place])   Altered mental status   Neurogenic bladder   Suprapubic catheter ([place])   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 ([place])   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([place])   History of pulmonary embolism   DVT (deep venous thrombosis) ([name])   Severe protein-calorie malnutrition ([place])   Dysphagia, pharyngoesophageal phase   Decubitus ulcer of right perineal ischial region, stage 4 ([name])   Decubitus ulcer of left perineal ischial region, stage 4 ([name])   Sacral decubitus ulcer, stage IV ([name])   Multiple allergies   Recurrent UTI   Respiratory failure, chronic ([name])   Aspiration pneumonia of right lower lobe ([name])   Seizures ([name])   Anxiety state   Anemia of chronic disease   Hyponatremia   Encephalopathy   Other acute osteomyelitis, multiple sites ([place])   Lower GI bleed   Viridans streptococci infection   Coma ([place])   Septic shock ([name])   Anaphylactic syndrome   Acute hypoxemic respiratory failure ([name])   Enterococcal infection   Thrombocytopenia ([name])   Status epilepticus ([name])   Chronic respiratory failure with hypoxia ([name])   Acute respiratory acidosis  Subjective: No response On levo, versed, propofol.   Abtx:  Anti-infectives    Start     Dose/Rate Route Frequency Ordered Stop   09/11/16 2200  ceFEPIme (MAXIPIME) 2 g in dextrose 5 % 50 mL IVPB  Status:  Discontinued     2 g 100 mL/hr over 30 Minutes Intravenous Every 8 hours 09/11/16 1830 09/11/16 1838   09/11/16 2200  ceFEPIme (MAXIPIME) 2 g in dextrose 5 % 50 mL IVPB  Status:  Discontinued     2 g 100 mL/hr over 30 Minutes Intravenous Every 8 hours 09/11/16 1838 09/15/16 1441   09/11/16 2000  ceFEPIme (MAXIPIME) 2 g in dextrose 5 % 50 mL IVPB  Status:  Discontinued     2 g 100 mL/hr over 30 Minutes Intravenous Every 8 hours 09/11/16 1813 09/11/16 1830   09/11/16 1400  meropenem  (MERREM) 1 g in sodium chloride 0.9 % 100 mL IVPB  Status:  Discontinued     1 g 200 mL/hr over 30 Minutes Intravenous Every 8 hours 09/11/16 1150 09/11/16 1813   09/11/16 1400  sulfamethoxazole-trimethoprim (BACTRIM) 240 mg of trimethoprim in dextrose 5 % 250 mL IVPB  Status:  Discontinued     240 mg of trimethoprim 265 mL/hr over 60 Minutes Intravenous Every 8 hours 09/11/16 1156 09/15/16 1441   09/10/16 2100  levofloxacin (LEVAQUIN) IVPB 750 mg  Status:  Discontinued     750 mg 100 mL/hr over 90 Minutes Intravenous Every 24 hours 09/10/16 1849 09/11/16 1115   09/10/16 2000  ceFEPIme (MAXIPIME) 2 g in dextrose 5 % 50 mL IVPB  Status:  Discontinued     2 g 100 mL/hr over 30 Minutes Intravenous Every 8 hours 09/10/16 1847 09/11/16 1140   09/08/16 1700  imipenem-cilastatin (PRIMAXIN) 1,000 mg in sodium chloride 0.9 % 250 mL IVPB  Status:  Discontinued     1,000 mg 250 mL/hr over 60 Minutes Intravenous Every 8 hours 09/08/16 1159 09/10/16 1736   09/01/16 1300  anidulafungin (ERAXIS) 100 mg in sodium chloride 0.9 % 100 mL IVPB  Status:  Discontinued     100 mg over 90 Minutes Intravenous Every 24 hours 08/31/16 1240 09/02/16 1252  09/01/16 1200  vancomycin (VANCOCIN) IVPB 750 mg/150 ml premix  Status:  Discontinued     750 mg 150 mL/hr over 60 Minutes Intravenous Every 24 hours 09/01/16 1145 09/01/16 1712   08/31/16 1700  imipenem-cilastatin (PRIMAXIN) 500 mg in sodium chloride 0.9 % 100 mL IVPB  Status:  Discontinued     500 mg 200 mL/hr over 30 Minutes Intravenous Every 8 hours 08/31/16 1240 09/08/16 1159   08/31/16 1300  anidulafungin (ERAXIS) 200 mg in sodium chloride 0.9 % 200 mL IVPB     200 mg over 180 Minutes Intravenous  Once 08/31/16 1240 08/31/16 1810   09/06/2016 0557  tigecycline (TYGACIL) 50 mg in sodium chloride 0.9 % 100 mL IVPB  Status:  Discontinued     50 mg 200 mL/hr over 30 Minutes Intravenous Every 12 hours 08/28/16 1758 08/31/16 1151   08/28/16 1800  tigecycline  (TYGACIL) 100 mg in sodium chloride 0.9 % 100 mL IVPB  Status:  Discontinued     100 mg 200 mL/hr over 30 Minutes Intravenous  Once 08/28/16 1758 08/31/16 1424   08/28/16 1300  linezolid (ZYVOX) IVPB 600 mg  Status:  Discontinued     600 mg 300 mL/hr over 60 Minutes Intravenous Every 12 hours 08/28/16 1239 08/31/16 2014   08/28/16 0200  aztreonam (AZACTAM) 1 g in dextrose 5 % 50 mL IVPB  Status:  Discontinued     1 g 100 mL/hr over 30 Minutes Intravenous Every 8 hours 08/27/16 1802 08/28/16 1232   08/27/16 2000  vancomycin (VANCOCIN) 500 mg in sodium chloride 0.9 % 100 mL IVPB  Status:  Discontinued     500 mg 100 mL/hr over 60 Minutes Intravenous Every 12 hours 08/27/16 1802 08/28/16 1239   08/27/16 1800  aztreonam (AZACTAM) 2 g in dextrose 5 % 50 mL IVPB     2 g 100 mL/hr over 30 Minutes Intravenous  Once 08/27/16 1634 08/27/16 1852   08/27/16 1600  levofloxacin (LEVAQUIN) IVPB 750 mg  Status:  Discontinued     750 mg 100 mL/hr over 90 Minutes Intravenous Every 24 hours 08/11/2016 1324 08/09/2016 1522   08/27/16 0000  vancomycin (VANCOCIN) 500 mg in sodium chloride 0.9 % 100 mL IVPB  Status:  Discontinued     500 mg 100 mL/hr over 60 Minutes Intravenous Every 12 hours 08/22/2016 1324 09/01/2016 1522   08/17/2016 2200  aztreonam (AZACTAM) 1 g in dextrose 5 % 50 mL IVPB  Status:  Discontinued     1 g 100 mL/hr over 30 Minutes Intravenous Every 8 hours 08/30/2016 1324 08/08/2016 1522   08/28/2016 1600  linezolid (ZYVOX) tablet 600 mg  Status:  Discontinued     600 mg Oral Every 12 hours 08/11/2016 1458 08/27/16 1347   08/25/2016 1300  levofloxacin (LEVAQUIN) IVPB 750 mg  Status:  Discontinued     750 mg 100 mL/hr over 90 Minutes Intravenous  Once 09/01/2016 1246 08/31/2016 1522   08/14/2016 1300  aztreonam (AZACTAM) 2 g in dextrose 5 % 50 mL IVPB  Status:  Discontinued     2 g 100 mL/hr over 30 Minutes Intravenous  Once 08/08/2016 1246 08/31/2016 1522   08/20/2016 1300  vancomycin (VANCOCIN) IVPB 1000 mg/200 mL  premix  Status:  Discontinued     1,000 mg 200 mL/hr over 60 Minutes Intravenous  Once 08/15/2016 1246 08/09/2016 1522      Medications:  Scheduled: . artificial tears   Both Eyes Q8H  . aspirin  325 mg Oral  Daily  . baclofen  10 mg Oral QID  . budesonide (PULMICORT) nebulizer solution  0.5 mg Nebulization BID  . chlorhexidine gluconate (MEDLINE KIT)  15 mL Mouth Rinse BID  . insulin aspart  2-6 Units Subcutaneous Q4H  . insulin glargine  5 Units Subcutaneous QHS  . ipratropium  0.5 mg Nebulization BID  . lacosamide (VIMPAT) IV  100 mg Intravenous Q12H  . levETIRAcetam  1,500 mg Intravenous Q12H  . mouth rinse  15 mL Mouth Rinse 10 times per day  . midazolam  40 mg Intravenous Once  . midodrine  20 mg Per Tube TID WC  . oxybutynin  5 mg Per Tube BID  . pantoprazole sodium  40 mg Per Tube Daily  . scopolamine  1 patch Transdermal Q72H  . topiramate  100 mg Oral BID  . valproate sodium  1,000 mg Intravenous Q6H    Objective: Vital signs in last 24 hours: Temp:  [97.9 F (36.6 C)-102.3 F (39.1 C)] 97.9 F (36.6 C) (12/12 0400) Pulse Rate:  [70-112] 71 (12/12 0800) Resp:  [0-25] 18 (12/12 0800) BP: (77-126)/(45-83) 99/59 (12/12 0800) SpO2:  [98 %-100 %] 100 % (12/12 0800) FiO2 (%):  [30 %] 30 % (12/12 0800) Weight:  [49 kg (108 lb)] 49 kg (108 lb) (12/12 0500)   General appearance: sedated on vent Neck: R IJ Resp: rhonchi anterior - bilateral Cardio: regular rate and rhythm GI: normal findings: soft, non-tender and abnormal findings:  hypoactive bowel sounds  Lab Results  Recent Labs  09/17/16 0354 09/18/16 0440  WBC 19.9* 21.8*  HGB 6.2* 8.1*  HCT 19.4* 25.2*  NA 138 143  K 3.8 3.5  CL 116* 123*  CO2 15* 15*  BUN 22* 22*  CREATININE 0.54* 0.50*   Liver Panel  Recent Labs  09/17/16 0354 09/18/16 0440  ALBUMIN 1.3* 1.2*   Sedimentation Rate No results for input(s): ESRSEDRATE in the last 72 hours. C-Reactive Protein No results for input(s): CRP in  the last 72 hours.  Microbiology: Recent Results (from the past 240 hour(s))  Culture, respiratory (NON-Expectorated)     Status: None (Preliminary result)   Collection Time: 09/14/16  1:19 PM  Result Value Ref Range Status   Specimen Description TRACHEAL ASPIRATE  Final   Special Requests NONE  Final   Gram Stain   Final    ABUNDANT WBC PRESENT, PREDOMINANTLY PMN RARE GRAM NEGATIVE RODS MODERATE YEAST    Culture   Final    ABUNDANT PSEUDOMONAS AERUGINOSA MODERATE STENOTROPHOMONAS MALTOPHILIA REPEATING SUSCEPTIBILITIES FOR PSEUDOMONAS    Report Status PENDING  Incomplete   Organism ID, Bacteria STENOTROPHOMONAS MALTOPHILIA  Final      Susceptibility   Stenotrophomonas maltophilia - MIC*    LEVOFLOXACIN 1 SENSITIVE Sensitive     TRIMETH/SULFA <=20 SENSITIVE Sensitive     * MODERATE STENOTROPHOMONAS MALTOPHILIA    Studies/Results: Dg Chest Port 1 View  Result Date: 09/18/2016 CLINICAL DATA:  30 year old Quadriplegic male with respiratory failure. Right lower lobe consolidation. Tracheostomy tube. Initial encounter. EXAM: PORTABLE CHEST 1 VIEW COMPARISON:  09/15/2016 and earlier, including CT Abdomen and Pelvis 09/11/2016 FINDINGS: Portable AP semi upright view at 0605 hours. Stable cardiac size and mediastinal contours. Tracheostomy tube positioning appears adequate. Stable right IJ central line. Continued confluent right lung base opacity seen in part due to consolidation and also peribronchial opacity which involve both the right middle and lower lobes on the recent CT. No superimposed pneumothorax or pulmonary edema. The small right pleural effusion  seen recently by CT is not evident. Overall ventilation has not significantly changed since 09/11/2016. IMPRESSION: 1. Right lung base consolidation and peribronchial opacity compatible with multilobar pneumonia not significantly improved since 09/11/2016 (as seen on CT Abdomen and Pelvis that date). 2. No new cardiopulmonary abnormality.  Electronically Signed   By: Genevie Ann M.D.   On: 09/18/2016 08:31     Assessment/Plan: C6 Spinal Infarct Quadraplegia Sepsis Streptococcal constellatus bacteremia Sacral Osteomyelitis Bone Bx 11-22 ESBL klebsiella Enterococcus (S- amp) Strep constellatus HCAP, aspiration Chronic Trach CXR worsening vascular congestion.   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

## 2016-09-18 NOTE — Progress Notes (Signed)
[facility] Progress Note Patient Name: GERBER SOLANKI DOB: 1986-03-27 MRN: [id number]   Date of Service  09/18/2016  HPI/Events of Note  k 3.5, phos 1.7  eICU Interventions  K phos given     Intervention Category Minor Interventions: Electrolytes abnormality - evaluation and management  Jett Kulzer S. 09/18/2016, 6:01 AM

## 2016-09-18 NOTE — Progress Notes (Signed)
Name: Jason [name] MRN: [id number] DOB: 15-Mar-1986    ADMISSION DATE:  09/01/2016 CONSULTATION DATE:  11/21  REFERRING MD :  [email or web address] ([place])   CHIEF COMPLAINT:  Lethargy, vent mgmt   BRIEF PATIENT DESCRIPTION:   30 yo male developed altered mental status after receiving azactam in setting of chronic wound infections (sacrum/heels).  He has hx of C6 spine injury with quadriplegia, tracheostomy status.  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  VITAL SIGNS: BP 108/77   Pulse 63   Temp 97.9 F (36.6 C) (Axillary)   Resp 18   Ht 5\' 8"  (1.727 m)   Wt 108 lb (49 kg)   SpO2 100%   BMI 16.42 kg/m   INTAKE/OUTPUT: I/O last 3 completed shifts: In: 6982.9 [I.V.:3887.9; Blood:150; Other:210; NG/GT:2250; IV Piggyback:485] Out: [id number] [Urine:5325; [id number]  PHYSICAL EXAMINATION: General: chronically ill appearing young male, some facial movement, neck ,arms. Sedated on vent HEENT: Tracheostomy clean Cardiovascular:  s1 s2 Regular rhythm Pulmonary: resps even non labored on vent, few scattered ronchi rt base Abdomen: Soft. Normal bowel sounds. Nondistended. , colostomy , TF via peg Neurological: perr, moves etx upper, head, not  fc   CMP Latest Ref Rng & Units 09/18/2016 09/17/2016 09/16/2016  Glucose 65 - 99 mg/dL 127(H) 171(H) 163(H)  BUN 6 - 20 mg/dL 22(H) 22(H) 24(H)  Creatinine 0.61 - 1.24 mg/dL 0.50(L) 0.54(L) 0.60(L)  Sodium 135 - 145 mmol/L 143 138 130(L)  Potassium 3.5 - 5.1 mmol/L 3.5 3.8 3.6  Chloride 101 - 111 mmol/L 123(H) 116(H) 110  CO2 22 - 32 mmol/L 15(L) 15(L) 15(L)  Calcium 8.9 - 10.3 mg/dL 7.6(L) 7.4(L) 7.5(L)  Total Protein 6.5 - 8.1 g/dL - - -  Total Bilirubin 0.3 - 1.2 mg/dL - - -  Alkaline Phos 38 - 126 U/L - - -  AST 15 - 41 U/L - - -  ALT 17 - 63 U/L - - -    CBC Latest Ref Rng & Units 09/18/2016 09/17/2016 09/16/2016  WBC 4.0 - 10.5 K/uL 21.8(H) 19.9(H) 21.9(H)  Hemoglobin 13.0 - 17.0 g/dL 8.1(L) 6.2(LL) 7.0(L)  Hematocrit 39.0 - 52.0 % 25.2(L) 19.4(L) 20.6(L)  Platelets 150 - 400 K/uL 490(H) 453(H) 403(H)    Imaging: Dg Chest Port 1 View  Result Date: 09/18/2016 CLINICAL DATA:  30 year old Quadriplegic male with respiratory failure. Right lower lobe consolidation. Tracheostomy tube. Initial encounter. EXAM: PORTABLE CHEST 1 VIEW COMPARISON:  09/15/2016 and earlier, including CT Abdomen and Pelvis 09/11/2016 FINDINGS: Portable AP semi upright view at 0605 hours. Stable cardiac size and mediastinal contours. Tracheostomy tube positioning appears adequate. Stable right IJ central line. Continued confluent right lung base opacity seen in part due to consolidation and also peribronchial opacity which involve both the right middle and lower lobes on the recent CT.  No superimposed pneumothorax or pulmonary edema. The small right pleural effusion seen recently by CT is not evident. Overall ventilation has not significantly changed since 09/11/2016. IMPRESSION: 1. Right lung base consolidation and peribronchial opacity compatible with multilobar pneumonia not significantly improved since 09/11/2016 (as seen on CT Abdomen and Pelvis that date). 2. No new cardiopulmonary abnormality.  Electronically Signed   By: Genevie Ann M.D.   On: 09/18/2016 08:31    ASSESSMENT / PLAN:  Status epilepticus >> possibly related to Abx. 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 - AEDs per neurology - neurology arrange for cervical puncture for CSF sampling, 12/13 scheduled per IR - continue diprivan per neurology - IR to do cervical spinal tap 12/13  Acute on chronic respiratory failure. Tracheostomy status. - full vent support  - f/u CXR  HCAP, recurrent UTI, osteomyelitis, sacral/heel wounds. - monitor off Abx - ID following - still has Pseudomonas and Stenotrophomonas in sputum from 12/08 >> ?if this is chronic colonization.  Anemia of critical illness. - f/u CBC after transfusion 12/11 12/12 6.2->8.1  Rt subclavian/brachial DVT 2nd to PICC line 11/27. Hx of PE s/p IVC filter. Chronic diastolic CHF. Hypotension 2nd to sedation. - pressors to keep MAP > 55 - midodrine  Severe protein calorie malnutrition. DM gastroparesis. - tube feeds  DM type 1. - SSI  Updated pt's mother at bedside.  CC time 30 minutes  Chesley Mires, MD [place] 09/18/2016, 10:53 AM Pager:  [phone] After 3pm call: [phone]

## 2016-09-18 NOTE — Progress Notes (Addendum)
Referring Physician(s): Dr Mike Craze  Supervising Physician: Luanne Bras  Patient Status:  [facility] - In-pt  Chief Complaint:  Generalized status epilepticus  Subjective:  Needs LP per Dr Leonel Ramsay Infection from sacral source extending into L1-2;   So likely unsafe for LP procedure  Request now for cervical puncture for CSF and studies Dr Estanislado Pandy has reviewed imaging and approves procedure  Allergies: Amikacin sulfate; Cefuroxime axetil; Ertapenem; Imipenem; Meropenem; Morphine and related; Nsaids; Penicillins; Sulfa antibiotics; Tessalon [benzonatate]; Aztreonam; Levaquin [levofloxacin in d5w]; Rocephin [ceftriaxone]; and Shellfish allergy  Medications: Prior to Admission medications   Medication Sig Start Date End Date Taking? Authorizing Provider  acetaminophen (TYLENOL) 325 MG tablet Take 2 tablets (650 mg total) by mouth every 6 (six) hours as needed for mild pain, moderate pain, fever or headache. 01/14/15  Yes Geradine Girt, DO  baclofen (LIORESAL) 20 MG tablet Take 1 tablet (20 mg total) by mouth 4 (four) times daily. 06/26/16  Yes [name], MD  BISACODYL 5 MG EC tablet Take ONE (1) tablet by mouth twice daily as needed FOR MODERATE CONSTIPATION Patient taking differently: Take 5 MG by mouth twice daily as needed FOR MODERATE CONSTIPATION 06/19/16  Yes [name], MD  buPROPion (WELLBUTRIN XL) 150 MG 24 hr tablet Take 1 tablet (150 mg total) by mouth daily. 06/15/16  Yes [name], MD  camphor-menthol [facility]) lotion Apply topically as needed for itching. 09/02/15  Yes Shanker Kristeen Mans, MD  collagenase (SANTYL) ointment Apply 1 application topically 3 (three) times a week. Patient taking differently: Apply 1 application topically as needed (dressing change).  08/24/16  Yes [name], MD  dexlansoprazole (DEXILANT) 60 MG capsule Take 1 capsule (60 mg total) by mouth daily. 03/14/16  Yes [name], MD  docusate (COLACE) 50 MG/5ML liquid Take 10  mLs (100 mg total) by mouth 2 (two) times daily. Patient taking differently: Take 100 mg by mouth daily.  11/10/15  Yes [name], MD  fentaNYL (DURAGESIC - DOSED MCG/HR) 75 MCG/HR place 1 patch (30mcg total) onto the skin every 3 (three) days 08/24/16  Yes [name], MD  ferrous sulfate 325 (65 FE) MG tablet Take 1 tablet (325 mg total) by mouth daily with breakfast. 09/27/15  Yes [name], MD  glucagon (GLUCAGON EMERGENCY) 1 MG injection INJECT INTO MUSCLE ONCE AS NEEDED Patient taking differently: Inject 1 mg into the muscle once as needed (hypoglycemia). INJECT INTO MUSCLE ONCE AS NEEDED 07/05/15  Yes Renato Shin, MD  GLUCERNA (GLUCERNA) LIQD 1,000 mLs by PEG Tube route continuous. 70 ml/hr   Yes Historical Provider, MD  hydrocortisone (CORTEF) 10 MG tablet Take 1 tablet (10 mg total) by mouth every evening. Hold for the next 5 days, while using solumedrol. Then resume as previously instructed 08/18/16  Yes Verlee Monte, MD  HYDROmorphone (DILAUDID) 8 MG tablet Take ONE (1) tablet (8mg  total) by mouth every 6 hours as needed for severe pain 08/24/16  Yes [name], MD  insulin aspart (NOVOLOG FLEXPEN) 100 UNIT/ML FlexPen Inject 0-10 Units into the skin 4 (four) times daily as needed for high blood sugar (CBG >200). Per sliding scale: CBG <200 = 0 units, 201-300 = 4 units, 301-400 = 6 units, >400 = 10 units   Yes Historical Provider, MD  insulin glargine (LANTUS) 100 UNIT/ML injection Inject 0.22 mLs (22 Units total) into the skin at bedtime. Patient taking differently: Inject 22 Units into the skin every  morning.  07/31/16  Yes Barton Dubois, MD  loratadine (CLARITIN) 10 MG tablet Take 1 tablet (10 mg total) by mouth daily. 11/25/15  Yes [name], MD  LYRICA 100 MG capsule Take ONE (1)  capsule by mouth three times daily (morning,afternoon and evening) and 2 capsules at bedtime Patient taking differently: Take 100 mg by mouth three times a day (morning/afternoon/evening) and 200  mg at bedtime 05/25/16  Yes [name], MD  Magnesium Oxide 400 (240 Mg) MG TABS Take ONE (1) tablet by mouth twice daily Patient taking differently: Take 400 MG by mouth twice daily 08/09/16  Yes [name], MD  midodrine (PROAMATINE) 10 MG tablet Take 2 tablets (20 mg total) by mouth 3 (three) times daily with meals. Patient taking differently: Take 40 mg by mouth daily as needed.  10/28/15  Yes [name], MD  Multiple Vitamin (MULTIVITAMIN WITH MINERALS) TABS tablet Place 1 tablet into feeding tube daily. 06/01/15  Yes Erick Colace, NP  Omeprazole-Sodium Bicarbonate (ZEGERID OTC PO) Take 1 tablet by mouth as needed (for acid reflux or heartburn).   Yes Historical Provider, MD  ondansetron (ZOFRAN ODT) 8 MG disintegrating tablet Place 1 tablet (8 mg total) into feeding tube every 8 (eight) hours as needed for nausea or vomiting. 06/01/15  Yes Erick Colace, NP  oxybutynin (DITROPAN) 5 MG tablet Place 1 tablet (5 mg total) into feeding tube 2 (two) times daily. 08/23/16  Yes [name], MD  Oxycodone HCl 20 MG TABS Take 1 tablet (20 mg total) by mouth every 6 (six) hours as needed (pain). 08/24/16  Yes [name], MD  VOLTAREN 1 % GEL apply 2 grams topically twice a day Patient taking differently: Apply 2 grams topically twice a day as needed for shoulder pain 04/24/16  Yes [name], MD  Water For Irrigation, Sterile (FREE WATER) SOLN Place 200 mLs into feeding tube every 8 (eight) hours. Patient taking differently: Place 300-400 mLs into feeding tube every 8 (eight) hours.  10/11/15  Yes [name], MD  albuterol (PROVENTIL) (2.5 MG/3ML) 0.083% nebulizer solution Take 3 mLs (2.5 mg total) by nebulization 4 (four) times daily. 08/10/2016   [name], MD  famotidine (PEPCID) 10 MG tablet Take 1 tablet (10 mg total) by mouth every 8 (eight) hours. Patient not taking: Reported on 08/22/2016 08/18/16   Verlee Monte, MD  famotidine (PEPCID) 20 MG tablet Take 1 tablet (20 mg total)  by mouth 2 (two) times daily. Take around antibiotics infusion time Patient not taking: Reported on 08/23/2016 07/31/16   Barton Dubois, MD  hydrocortisone (CORTEF) 20 MG tablet Take 1 tablet (20 mg total) by mouth every morning. Hold for the next 5 days, while on solumedrol. Resume after this time as previously 08/18/16   Verlee Monte, MD  LORazepam (ATIVAN) 0.5 MG tablet Take 1 tablet (0.5 mg total) by mouth 3 (three) times daily as needed for anxiety. Reported on 11/02/2015 Patient taking differently: Take 0.25-0.5 mg by mouth 3 (three) times daily as needed for anxiety. Reported on 11/02/2015 06/20/16   [name], MD  mupirocin ointment Drue Stager) 2 % Apply to affected area twice a day as needed 06/19/16   [name], MD     Vital Signs: BP 126/83   Pulse 71   Temp 97.9 F (36.6 C) (Axillary)   Resp 18   Ht 5\' 8"  (1.727 m)   Wt 108 lb (49 kg)  SpO2 100%   BMI 16.42 kg/m   Physical Exam  Cardiovascular: Normal rate and regular rhythm.   Pulmonary/Chest:  vent'trach  Abdominal: Soft.  Neurological:  No response  Skin: Skin is warm and dry.  Psychiatric:  Consented with mom over phone  Nursing note and vitals reviewed.   Imaging: Mr Jeri Cos [id number] Contrast  Result Date: 09/15/2016 CLINICAL DATA:  Seizure.  Diabetes. EXAM: MRI HEAD WITHOUT AND WITH CONTRAST TECHNIQUE: Multiplanar, multiecho pulse sequences of the brain and surrounding structures were obtained without and with intravenous contrast. CONTRAST:  32mL MULTIHANCE GADOBENATE DIMEGLUMINE 529 MG/ML IV SOLN COMPARISON:  CT head 09/11/2016 FINDINGS: Brain: Small focus of restricted diffusion in the left paracentral mid pons compatible with acute infarct. This measures 3 mm in diameter. No other areas of acute infarct. Mild atrophy for age. 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 Small white matter hyperintensities likely due to mild chronic microvascular ischemia. Negative for hemorrhage or mass. Normal enhancement  following contrast infusion. Vascular: Normal arterial flow voids.  Normal venous enhancement. Skull and upper cervical spine: Negative Sinuses/Orbits: Mucosal edema in the paranasal sinuses most notably the sphenoid sinus. Normal orbit. 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 Mild chronic microvascular ischemic change in the white matter. Electronically Signed   By: Franchot Gallo M.D.   On: 09/15/2016 13:10   Dg Chest Port 1 View  Result Date: 09/18/2016 CLINICAL DATA:  30 year old Quadriplegic male with respiratory failure. Right lower lobe consolidation. Tracheostomy tube. Initial encounter. EXAM: PORTABLE CHEST 1 VIEW COMPARISON:  09/15/2016 and earlier, including CT Abdomen and Pelvis 09/11/2016 FINDINGS: Portable AP semi upright view at 0605 hours. Stable cardiac size and mediastinal contours. Tracheostomy tube positioning appears adequate. Stable right IJ central line. Continued confluent right lung base opacity seen in part due to consolidation and also peribronchial opacity which involve both the right middle and lower lobes on the recent CT. No superimposed pneumothorax or pulmonary edema. The small right pleural effusion seen recently by CT is not evident. Overall ventilation has not significantly changed since 09/11/2016. IMPRESSION: 1. Right lung base consolidation and peribronchial opacity compatible with multilobar pneumonia not significantly improved since 09/11/2016 (as seen on CT Abdomen and Pelvis that date). 2. No new cardiopulmonary abnormality. Electronically Signed   By: Genevie Ann M.D.   On: 09/18/2016 08:31   Dg Chest Port 1 View  Result Date: 09/15/2016 CLINICAL DATA:  Patient with history of pneumonia. Respiratory failure. EXAM: PORTABLE CHEST 1 VIEW COMPARISON:  Chest radiograph 09/14/2016. FINDINGS: Right IJ central venous catheter tip projects over the superior vena cava. Tracheostomy tube stable in position.  Stable cardiac and mediastinal contours. Persistent consolidative opacities right mid lower lung. Minimal heterogeneous opacities left lung base. No pleural effusion or pneumothorax. IMPRESSION: Stable support apparatus. Persistent consolidation right lower lung. Unchanged heterogeneous opacities left lung base. Electronically Signed   By: Lovey Newcomer M.D.   On: 09/15/2016 08:07    Labs:  CBC:  Recent Labs  09/15/16 0500 09/16/16 0500 09/17/16 0354 09/18/16 0440  WBC 23.1* 21.9* 19.9* 21.8*  HGB 7.0* 7.0* 6.2* 8.1*  HCT 21.5* 20.6* 19.4* 25.2*  PLT 348 403* 453* 490*    COAGS:  Recent Labs  08/17/2016 1756  INR 1.24  APTT 58*    BMP:  Recent Labs  09/15/16 0500 09/16/16 0014 09/17/16 0354 09/18/16 0440  NA 135 130* 138 143  K 3.8 3.6 3.8 3.5  CL 110  110 116* 123*  CO2 17* 15* 15* 15*  GLUCOSE 129* 163* 171* 127*  BUN 23* 24* 22* 22*  CALCIUM 7.4* 7.5* 7.4* 7.6*  CREATININE 0.62 0.60* 0.54* 0.50*  GFRNONAA >60 >60 >60 >60  GFRAA >60 >60 >60 >60    LIVER FUNCTION TESTS:  Recent Labs  09/12/16 0331 09/13/16 0434 09/14/16 0453 09/15/16 0500 09/16/16 0014 09/17/16 0354 09/18/16 0440  BILITOT 0.5 0.4 0.4 0.3  --   --   --   AST 14* 14* 14* 16  --   --   --   ALT 8* 8* 9* 9*  --   --   --   ALKPHOS 181* 151* 130* 113  --   --   --   PROT 4.5* 4.5* 4.3* 4.7*  --   --   --   ALBUMIN 1.3* 1.2* 1.3*  1.3* 1.3* 1.4* 1.3* 1.2*    Assessment and Plan:  Generalized status epilepticus For cervical puncture in IR Risks and Benefits discussed with the patient's mother including, but not limited to bleeding, infection, damage to adjacent structures or low yield requiring additional tests. All of mother's questions were answered, she is agreeable to proceed. Consent signed and in chart.   Electronically Signed: Monia Sabal A 09/18/2016, 8:34 AM   I spent a total of 25 Minutes at the the patient's bedside AND on the patient's hospital floor or unit,  greater than 50% of which was counseling/coordinating care for cervical puncture

## 2016-09-18 NOTE — Procedures (Signed)
History: 30 yo M with status epilepticus, now in induced coma  Sedation: Propofol, Versed  Technique: This is a 21 channel continuous scalp EEG performed at the bedside with bipolar and monopolar montages arranged in accordance to the international 10/20 system of electrode placement. One channel was dedicated to EKG recording. This was a 24 hour recording from 12/11 7:30 am to 12/12 7:30 am.    Background: At the onset of recording, there is a burst suppression pattern with the bursts consisting of low voltage irregular delta and theta activity lasting 1-3 seconds and interburst interval of 5-10 seconds. In addition there are generalized periodic discharges with delta wave morphology, though there is a prominent positive component. These occur at a period of approximately 3 seconds and occur until approximately noon at which time the generalized periodic discharges dissipate. The remainder of the recording is a burst suppression pattern without generalized periodic discharges.  Photic stimulation: Physiologic driving is not performed  EEG Abnormalities: 1) burst suppression pattern  2) Generalized periodic discharges  Clinical Interpretation: This is consistent with a profound generalized cerebral dysfunction as can be seen with medication induced coma. No seizures were seen.   The Generalized discharges seen in the beginning of the study were not clearly ictal in nature, but do suggest continued cortical irritability.   Roland Rack, MD [facility] [phone]  If 7pm- 7am, please page neurology on call as listed in [place].

## 2016-09-18 NOTE — Progress Notes (Signed)
Nutrition Follow Up  DOCUMENTATION CODES:   Underweight  INTERVENTION:    Increase Vital AF 1.2 to goal rate of 65 ml/hr  TF regimen + current Propofol to provide 2020 kcals, 117 gm protein, 1265 ml of free water  NUTRITION DIAGNOSIS:   Increased nutrient needs related to wound healing as evidenced by estimated needs, ongoing  GOAL:   Patient will meet greater than or equal to 90% of their needs, met  MONITOR:   TF tolerance, Vent status, Labs, Weight trends, Skin, I & O's  ASSESSMENT:   30 y.o. 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  Patient typically receives nocturnal TF with Glucerna 1.2 and eats regular foods during the day.  Patient is currently on ventilator support >> trach MV: 10.3 L/min Temp (24hrs), Avg:100.3 F (37.9 C), Min:97.9 F (36.6 C), Max:102.3 F (39.1 C)  Propofol: 5.6 ml/hr >>> 148 fat kcals   Pt transferred to 2S-SICU 12/4 due to new onset refractory status epilepticus. S/p EEG 12/6 >> consistent with profound generalized cerebral dysfunction. Vital AF 1.2 formula currently infusing at goal rate of 60 ml/hr via PEG tube. IR to do cervical puncture for CSF sample 12/13. Infectious Disease continues to follow. CBG's 75-27-163.  Diet Order:  Diet NPO time specified Diet NPO time specified Except for: Sips with Meds  Skin:  Wound (see comment) (Stg 2 R elbow, back; stg 2 L heel; stg 4 coccyx, buttocks)  Last BM:  12/11  Height:   Ht Readings from Last 1 Encounters:  08/15/2016 5' 8" (1.727 m)    Weight: >>> fluctuating   Wt Readings from Last 1 Encounters:  09/18/16 108 lb (49 kg)   12/11  106 lb 12/10  109 lb 12/09  104 lb 12/08  102 lb 12/07  106  lb 12/06  106 lb 12/05  101 lb 12/04  118 lb 12/03  114 lb 12/02  110 lb 12/01  119 lb  Ideal Body Weight:  63 kg (adjusted for quadriplegia)  BMI:  Body mass index is 16.42 kg/m.  Re-estimated Nutritional Needs:   Kcal:  1992  Protein:  110-120 gm  Fluid:  per MD  EDUCATION NEEDS:   No education needs identified at this time   Katie , RD, LDN Pager #: [phone] After-Hours Pager #: [phone]   

## 2016-09-18 NOTE — Progress Notes (Signed)
Subjective: Intubated   Exam: Vitals:   09/18/16 0700 09/18/16 0715  BP: 111/70 126/78  Pulse: 82 79  Resp: 18 18  Temp:     Gen: In bed, NAD Resp: non-labored breathing, no acute distress Abd: soft, nt  Neuro: Exam confounded by induced coma.  MS: does not respond [id number] unreactive, face symmetric Motor: no movement  Sensory:as above   Impression: 30 yo M with new onset super refratory generalized status epilepticus(NORSE). He has been covered for meningitis and ideally I would like to do a lumbar puncture, however reviewing his films he has infection from his sacral source extending up into L1, 2 and I don't think that it would be safe to put a needle through infected tissue to access the CSF space. Also, developing meningitis while on the broad coverage which would have covered the CNS even prior to an developing symptoms would be very unusual. Also with infection, there typically are focal seizures as well, and his appear to be generalized.  Suspicion therefore I think needs to be geared more towards a toxic/metabolic issue and the option that has been foremost in my thinking is medication related given that he was on antibiotics which lower seizure threshold.  His mother reports multiple episodes of decreased mental status, obtundation, etc. with beta-lactam antibiotics in the past and I wonder if he is somewhat sensitive to this Neurotoxicity reaction. Given the cefepime is one agent known to cause neurotoxicity, I discussed with infectious disease possibly holding antibiotics given the severe nature of his current illness. Discussed this with Dr. Megan Salon who agreed.  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 I will however, start antiplatelet therapy.  He has GPEDs at times even with burst suppression, but these are slow. These seem to be new at this level of suppression, and  I am concerned with continued irritability at this point. I have therefore requested cervical puncture to rule out infection.   Recommendations: 1) VPA level, adjust dose accordingly. May expect some rebound following cessation of burst suppression.  2) continue keppra 1500mg  BID 3) continue vimpat 200mg  BID 4) continue topamax 100mg  BID 5) continue burst suppression 6) continue asa 7) LDL 97, but don't want to add any further medications at the current time, will need statin eventually.    This patient is critically ill and at significant risk of neurological worsening, death and care requires constant monitoring of vital signs, hemodynamics,respiratory and cardiac monitoring, neurological assessment, discussion with family, other specialists and medical decision making of high complexity. I spent 35 minutes of neurocritical care time  in the care of  this patient.  Roland Rack, MD [facility] [phone]  If 7pm- 7am, please page neurology on call as listed in [place]. 09/18/2016  7:29 AM

## 2016-09-18 NOTE — Progress Notes (Signed)
Inpatient Diabetes Program Recommendations  AACE/ADA: New Consensus Statement on Inpatient Glycemic Control (2015)  Target Ranges:  Prepandial:   less than 140 mg/dL      Peak postprandial:   less than 180 mg/dL (1-2 hours)      Critically ill patients:  140 - 180 mg/dL   Lab Results  Component Value Date   GLUCAP 163 (H) 09/18/2016   HGBA1C 6.1 (H) 09/16/2016   Inpatient Diabetes Program Recommendations:    Consider reduction of Novolog correction to sensitive q 4 hours.  Thanks, Adah Perl, RN, BC-ADM Inpatient Diabetes Coordinator Pager [phone] (8a-5p)

## 2016-09-18 NOTE — Progress Notes (Signed)
Electrodes checked and FP1, FP2, F7, F8, C4, P3 all re prepped and re glued. All impedances 5k ohms and under. No additional skin breakdown seen.

## 2016-09-18 NOTE — Progress Notes (Signed)
Subjective: Intubated   Exam: Vitals:   09/18/16 1615 09/18/16 1700  BP: 91/62   Pulse: 67 75  Resp: 18   Temp:     Gen: In bed, NAD Resp: non-labored breathing, no acute distress Abd: soft, nt  Neuro: Exam confounded by induced coma.  MS: does not respond [id number] unreactive, face symmetric Motor: no movement  Sensory:as above   Impression: 30 yo M with new onset super refratory generalized status epilepticus(NORSE). He has been covered for meningitis and ideally I would like to do a lumbar puncture, however reviewing his films he has infection from his sacral source extending up into L1, 2 and I don't think that it would be safe to put a needle through infected tissue to access the CSF space. Also, developing meningitis while on the broad coverage which would have covered the CNS even prior to an developing symptoms would be unusual.   Suspicion therefore I think needs to be geared more towards a toxic/metabolic issue and the option that has been foremost in my thinking is medication related given that he was on antibiotics which lower seizure threshold.  His mother reports multiple episodes of decreased mental status, obtundation, etc. with beta-lactam antibiotics in the past and I wonder if he is somewhat sensitive to this Neurotoxicity reaction. Given the cefepime is one agent known to cause neurotoxicity, I discussed with infectious disease possibly holding antibiotics given the severe nature of his current illness. Discussed this with Dr. Megan Salon who agreed.  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 I will however, start antiplatelet therapy.  He had 48 hours burst suppression, then when weaning on 12/08 He had frequent generalized seizures. On 12/10, he had re-emergence of GPEDs without ictal evolution superimposed on the burst suppression pattern. Given the continued  irritability, I have requested cervical puncture to be done tomorrow.     Recommendations: 1) repeat VPA level before midnight dose. Currently at 1250mg  Q6H. Once off of propofol, this may increase. 2) continue keppra 1500mg  BID 3) continue vimpat 200mg  BID 4) continue topamax 100mg  BID 5) begin weaning propofol and versed 6) continue asa 7) LDL 97, but don't want to add any further medications at the current time, will need statin eventually.  8) cervical puncture to assess for meningitis.   This patient is critically ill and at significant risk of neurological worsening, death and care requires constant monitoring of vital signs, hemodynamics,respiratory and cardiac monitoring, neurological assessment, discussion with family, other specialists and medical decision making of high complexity. I spent 35 minutes of neurocritical care time  in the care of  this patient.  Roland Rack, MD [facility] [phone]  If 7pm- 7am, please page neurology on call as listed in [place]. 09/18/2016  5:40 PM

## 2016-09-18 NOTE — Procedures (Addendum)
History: 30 yo M with status epilepticus, now in induced coma  Sedation: Propofol, Versed  Technique: This is a 21 channel continuous scalp EEG performed at the bedside with bipolar and monopolar montages arranged in accordance to the international 10/20 system of electrode placement. One channel was dedicated to EKG recording. This was a 24 hour recording from 12/10 7:30 am to 12/11 7:30 am.    Background: The background consists of generalized suppression with bursts consisting of 1 - 3 seconds of delta and theta activity. This interburst interval ranges from 8 to 15 seconds. There are occasional runs of low amplitude delta morphology GPDs without evolution and with a period of 2 - 3 seconds that began in the late morning of 12/10.  Even when this pattern is present there is a good burst suppression pattern otherwise and the GPDs are superimposed. There is no evolution to suggest an ictal nature. There is a run of these around 11:30 am lasting 20 - 30 minutes, then again seen at 10:24 pm, lasting almost an hour. Again at 6:52 am they are seen again, lasting until the end of the recording.   Photic stimulation: Physiologic driving is not performed  EEG Abnormalities: 1) burst suppression pattern  2) Generalized periodic discahrges  Clinical Interpretation: This is consistent with a profound generalized cerebral dysfunction as can be seen with medication induced coma. No seizures were seen.   The Generalized discharges were not clearly ictal in nature, but do represent continued cortical irritability.   Roland Rack, MD [facility] [phone]  If 7pm- 7am, please page neurology on call as listed in [place].

## 2016-09-19 ENCOUNTER — Other Ambulatory Visit (HOSPITAL_COMMUNITY): Payer: Medicaid Other

## 2016-09-19 ENCOUNTER — Encounter (HOSPITAL_COMMUNITY): Admission: EM | Disposition: E | Payer: Self-pay | Source: Home / Self Care | Attending: Pulmonary Disease

## 2016-09-19 ENCOUNTER — Inpatient Hospital Stay (HOSPITAL_COMMUNITY): Payer: Medicaid Other

## 2016-09-19 LAB — RENAL FUNCTION PANEL
ALBUMIN: 1.2 g/dL — AB (ref 3.5–5.0)
Anion gap: 5 (ref 5–15)
BUN: 23 mg/dL — AB (ref 6–20)
CALCIUM: 7.7 mg/dL — AB (ref 8.9–10.3)
CHLORIDE: 124 mmol/L — AB (ref 101–111)
CO2: 14 mmol/L — ABNORMAL LOW (ref 22–32)
CREATININE: 0.44 mg/dL — AB (ref 0.61–1.24)
Glucose, Bld: 62 mg/dL — ABNORMAL LOW (ref 65–99)
PHOSPHORUS: 1.9 mg/dL — AB (ref 2.5–4.6)
Potassium: 3.6 mmol/L (ref 3.5–5.1)
SODIUM: 143 mmol/L (ref 135–145)

## 2016-09-19 LAB — CULTURE, RESPIRATORY

## 2016-09-19 LAB — GLUCOSE, CAPILLARY
GLUCOSE-CAPILLARY: 166 mg/dL — AB (ref 65–99)
Glucose-Capillary: 125 mg/dL — ABNORMAL HIGH (ref 65–99)
Glucose-Capillary: 130 mg/dL — ABNORMAL HIGH (ref 65–99)
Glucose-Capillary: 133 mg/dL — ABNORMAL HIGH (ref 65–99)
Glucose-Capillary: 155 mg/dL — ABNORMAL HIGH (ref 65–99)
Glucose-Capillary: 268 mg/dL — ABNORMAL HIGH (ref 65–99)
Glucose-Capillary: 35 mg/dL — CL (ref 65–99)
Glucose-Capillary: 77 mg/dL (ref 65–99)

## 2016-09-19 LAB — CBC
HCT: 23.6 % — ABNORMAL LOW (ref 39.0–52.0)
HEMOGLOBIN: 7.7 g/dL — AB (ref 13.0–17.0)
MCH: 27.9 pg (ref 26.0–34.0)
MCHC: 32.6 g/dL (ref 30.0–36.0)
MCV: 85.5 fL (ref 78.0–100.0)
PLATELETS: 502 10*3/uL — AB (ref 150–400)
RBC: 2.76 MIL/uL — AB (ref 4.22–5.81)
RDW: 18.8 % — ABNORMAL HIGH (ref 11.5–15.5)
WBC: 18 10*3/uL — AB (ref 4.0–10.5)

## 2016-09-19 LAB — CULTURE, RESPIRATORY W GRAM STAIN

## 2016-09-19 LAB — TRIGLYCERIDES: Triglycerides: 133 mg/dL (ref <150)

## 2016-09-19 LAB — MAGNESIUM: MAGNESIUM: 1.6 mg/dL — AB (ref 1.7–2.4)

## 2016-09-19 SURGERY — RADIOLOGY WITH ANESTHESIA
Anesthesia: General

## 2016-09-19 MED ORDER — LACTATED RINGERS IV SOLN
INTRAVENOUS | Status: DC
  Administered 2016-09-19: 13:00:00 via INTRAVENOUS

## 2016-09-19 MED ORDER — INSULIN ASPART 100 UNIT/ML ~~LOC~~ SOLN
1.0000 [IU] | SUBCUTANEOUS | Status: DC
  Administered 2016-09-19: 3 [IU] via SUBCUTANEOUS
  Administered 2016-09-19: 1 [IU] via SUBCUTANEOUS
  Administered 2016-09-20 (×2): 2 [IU] via SUBCUTANEOUS
  Administered 2016-09-20: 1 [IU] via SUBCUTANEOUS
  Administered 2016-09-20: 2 [IU] via SUBCUTANEOUS
  Administered 2016-09-21: 3 [IU] via SUBCUTANEOUS
  Administered 2016-09-21: 2 [IU] via SUBCUTANEOUS

## 2016-09-19 MED ORDER — ASPIRIN 325 MG PO TABS: 325.0000 mg | ORAL_TABLET | Freq: Every day | ORAL | Status: DC

## 2016-09-19 MED ORDER — DEXTROSE 50 % IV SOLN
INTRAVENOUS | Status: AC
  Administered 2016-09-19: 50 mL
  Filled 2016-09-19: qty 50

## 2016-09-19 NOTE — Progress Notes (Signed)
Inpatient Diabetes Program Recommendations  AACE/ADA: New Consensus Statement on Inpatient Glycemic Control (2015)  Target Ranges:  Prepandial:   less than 140 mg/dL      Peak postprandial:   less than 180 mg/dL (1-2 hours)      Critically ill patients:  140 - 180 mg/dL   Lab Results  Component Value Date   GLUCAP 130 (H) 10/03/2016   HGBA1C 6.1 (H) 09/16/2016    Review of Glycemic Control:  Results for Jason [name], Jason [name] (MRN [id number]) as of 09/23/2016 14:17  Ref. Range 09/21/2016 01:02 10/04/2016 03:52 09/14/2016 07:48 09/14/2016 08:14  12:41  Glucose-Capillary Latest Ref Range: 65 - 99 mg/dL 166 (H) 77 35 (LL) 125 (H) 130 (H)   Inpatient Diabetes Program Recommendations:    Called MD to discuss decreasing Novolog correction.  Please consider reducing Novolog correction to sensitive.  Orders received.  Thanks, Adah Perl, RN, BC-ADM Inpatient Diabetes Coordinator Pager [phone] (8a-5p)

## 2016-09-19 NOTE — Progress Notes (Signed)
INFECTIOUS DISEASE PROGRESS NOTE  ID: Jason [name] is a 30 y.o. male with  Active Problems:   Gastroparesis due to DM ([place])   Altered mental status   Neurogenic bladder   Suprapubic catheter ([place])   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 ([place])   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([place])   History of pulmonary embolism   DVT (deep venous thrombosis) ([name])   Severe protein-calorie malnutrition ([place])   Dysphagia, pharyngoesophageal phase   Decubitus ulcer of right perineal ischial region, stage 4 ([name])   Decubitus ulcer of left perineal ischial region, stage 4 ([name])   Sacral decubitus ulcer, stage IV ([name])   Multiple allergies   Recurrent UTI   Respiratory failure, chronic ([name])   Aspiration pneumonia of right lower lobe ([name])   Seizures ([name])   Anxiety state   Anemia of chronic disease   Hyponatremia   Encephalopathy   Other acute osteomyelitis, multiple sites ([place])   Lower GI bleed   Viridans streptococci infection   Coma ([place])   Septic shock ([name])   Anaphylactic syndrome   Acute hypoxemic respiratory failure ([name])   Enterococcal infection   Thrombocytopenia ([name])   Status epilepticus ([name])   Chronic respiratory failure with hypoxia ([name])   Acute respiratory acidosis  Subjective: Tmax 100.4 Some twitching last pm per nursing.  Abtx:  Anti-infectives    Start     Dose/Rate Route Frequency Ordered Stop   09/11/16 2200  ceFEPIme (MAXIPIME) 2 g in dextrose 5 % 50 mL IVPB  Status:  Discontinued     2 g 100 mL/hr over 30 Minutes Intravenous Every 8 hours 09/11/16 1830 09/11/16 1838   09/11/16 2200  ceFEPIme (MAXIPIME) 2 g in dextrose 5 % 50 mL IVPB  Status:  Discontinued     2 g 100 mL/hr over 30 Minutes Intravenous Every 8 hours 09/11/16 1838 09/15/16 1441   09/11/16 2000  ceFEPIme (MAXIPIME) 2 g in dextrose 5 % 50 mL IVPB  Status:  Discontinued     2 g 100 mL/hr over 30 Minutes Intravenous Every 8 hours 09/11/16 1813 09/11/16 1830   09/11/16 1400   meropenem (MERREM) 1 g in sodium chloride 0.9 % 100 mL IVPB  Status:  Discontinued     1 g 200 mL/hr over 30 Minutes Intravenous Every 8 hours 09/11/16 1150 09/11/16 1813   09/11/16 1400  sulfamethoxazole-trimethoprim (BACTRIM) 240 mg of trimethoprim in dextrose 5 % 250 mL IVPB  Status:  Discontinued     240 mg of trimethoprim 265 mL/hr over 60 Minutes Intravenous Every 8 hours 09/11/16 1156 09/15/16 1441   09/10/16 2100  levofloxacin (LEVAQUIN) IVPB 750 mg  Status:  Discontinued     750 mg 100 mL/hr over 90 Minutes Intravenous Every 24 hours 09/10/16 1849 09/11/16 1115   09/10/16 2000  ceFEPIme (MAXIPIME) 2 g in dextrose 5 % 50 mL IVPB  Status:  Discontinued     2 g 100 mL/hr over 30 Minutes Intravenous Every 8 hours 09/10/16 1847 09/11/16 1140   09/08/16 1700  imipenem-cilastatin (PRIMAXIN) 1,000 mg in sodium chloride 0.9 % 250 mL IVPB  Status:  Discontinued     1,000 mg 250 mL/hr over 60 Minutes Intravenous Every 8 hours 09/08/16 1159 09/10/16 1736   09/01/16 1300  anidulafungin (ERAXIS) 100 mg in sodium chloride 0.9 % 100 mL IVPB  Status:  Discontinued     100 mg over 90 Minutes Intravenous Every 24 hours 08/31/16 1240 09/02/16 1252  09/01/16 1200  vancomycin (VANCOCIN) IVPB 750 mg/150 ml premix  Status:  Discontinued     750 mg 150 mL/hr over 60 Minutes Intravenous Every 24 hours 09/01/16 1145 09/01/16 1712   08/31/16 1700  imipenem-cilastatin (PRIMAXIN) 500 mg in sodium chloride 0.9 % 100 mL IVPB  Status:  Discontinued     500 mg 200 mL/hr over 30 Minutes Intravenous Every 8 hours 08/31/16 1240 09/08/16 1159   08/31/16 1300  anidulafungin (ERAXIS) 200 mg in sodium chloride 0.9 % 200 mL IVPB     200 mg over 180 Minutes Intravenous  Once 08/31/16 1240 08/31/16 1810   08/25/2016 0557  tigecycline (TYGACIL) 50 mg in sodium chloride 0.9 % 100 mL IVPB  Status:  Discontinued     50 mg 200 mL/hr over 30 Minutes Intravenous Every 12 hours 08/28/16 1758 08/31/16 1151   08/28/16 1800   tigecycline (TYGACIL) 100 mg in sodium chloride 0.9 % 100 mL IVPB  Status:  Discontinued     100 mg 200 mL/hr over 30 Minutes Intravenous  Once 08/28/16 1758 08/31/16 1424   08/28/16 1300  linezolid (ZYVOX) IVPB 600 mg  Status:  Discontinued     600 mg 300 mL/hr over 60 Minutes Intravenous Every 12 hours 08/28/16 1239 08/31/16 2014   08/28/16 0200  aztreonam (AZACTAM) 1 g in dextrose 5 % 50 mL IVPB  Status:  Discontinued     1 g 100 mL/hr over 30 Minutes Intravenous Every 8 hours 08/27/16 1802 08/28/16 1232   08/27/16 2000  vancomycin (VANCOCIN) 500 mg in sodium chloride 0.9 % 100 mL IVPB  Status:  Discontinued     500 mg 100 mL/hr over 60 Minutes Intravenous Every 12 hours 08/27/16 1802 08/28/16 1239   08/27/16 1800  aztreonam (AZACTAM) 2 g in dextrose 5 % 50 mL IVPB     2 g 100 mL/hr over 30 Minutes Intravenous  Once 08/27/16 1634 08/27/16 1852   08/27/16 1600  levofloxacin (LEVAQUIN) IVPB 750 mg  Status:  Discontinued     750 mg 100 mL/hr over 90 Minutes Intravenous Every 24 hours 08/14/2016 1324 08/28/2016 1522   08/27/16 0000  vancomycin (VANCOCIN) 500 mg in sodium chloride 0.9 % 100 mL IVPB  Status:  Discontinued     500 mg 100 mL/hr over 60 Minutes Intravenous Every 12 hours 09/04/2016 1324 08/27/2016 1522   08/13/2016 2200  aztreonam (AZACTAM) 1 g in dextrose 5 % 50 mL IVPB  Status:  Discontinued     1 g 100 mL/hr over 30 Minutes Intravenous Every 8 hours 09/05/2016 1324 09/01/2016 1522   09/02/2016 1600  linezolid (ZYVOX) tablet 600 mg  Status:  Discontinued     600 mg Oral Every 12 hours 08/18/2016 1458 08/27/16 1347   08/21/2016 1300  levofloxacin (LEVAQUIN) IVPB 750 mg  Status:  Discontinued     750 mg 100 mL/hr over 90 Minutes Intravenous  Once 08/21/2016 1246 08/09/2016 1522   08/11/2016 1300  aztreonam (AZACTAM) 2 g in dextrose 5 % 50 mL IVPB  Status:  Discontinued     2 g 100 mL/hr over 30 Minutes Intravenous  Once 08/18/2016 1246 08/18/2016 1522   08/31/2016 1300  vancomycin (VANCOCIN) IVPB 1000  mg/200 mL premix  Status:  Discontinued     1,000 mg 200 mL/hr over 60 Minutes Intravenous  Once 08/20/2016 1246 09/05/2016 1522      Medications:  Scheduled: . artificial tears   Both Eyes Q8H  . aspirin  325 mg Oral  Daily  . baclofen  10 mg Oral QID  . budesonide (PULMICORT) nebulizer solution  0.5 mg Nebulization BID  . chlorhexidine gluconate (MEDLINE KIT)  15 mL Mouth Rinse BID  . insulin aspart  2-6 Units Subcutaneous Q4H  . insulin glargine  5 Units Subcutaneous QHS  . ipratropium  0.5 mg Nebulization BID  . lacosamide (VIMPAT) IV  100 mg Intravenous Q12H  . levETIRAcetam  1,500 mg Intravenous Q12H  . mouth rinse  15 mL Mouth Rinse 10 times per day  . midazolam  40 mg Intravenous Once  . midodrine  20 mg Per Tube TID WC  . oxybutynin  5 mg Per Tube BID  . pantoprazole sodium  40 mg Per Tube Daily  . scopolamine  1 patch Transdermal Q72H  . topiramate  100 mg Oral BID  . valproate sodium  1,250 mg Intravenous Q6H    Objective: Vital signs in last 24 hours: Temp:  [93.2 F (34 C)-100.4 F (38 C)] 99.6 F (37.6 C) (12/13 0515) Pulse Rate:  [58-91] 76 (12/13 0800) Resp:  [17-27] 20 (12/13 0800) BP: (79-137)/(44-84) 137/76 (12/13 0800) SpO2:  [100 %] 100 % (12/13 0800) FiO2 (%):  [30 %] 30 % (12/13 0400) Weight:  [49.9 kg (110 lb)] 49.9 kg (110 lb) (12/13 0500)   General appearance: no distress Resp: clear to auscultation bilaterally Cardio: regular rate and rhythm GI: abnormal findings:  hypoactive bowel sounds  Lab Results  Recent Labs  09/18/16 0440 10/03/2016 0400  WBC 21.8* 18.0*  HGB 8.1* 7.7*  HCT 25.2* 23.6*  NA 143 143  K 3.5 3.6  CL 123* 124*  CO2 15* 14*  BUN 22* 23*  CREATININE 0.50* 0.44*   Liver Panel  Recent Labs  09/18/16 0440 09/29/2016 0400  ALBUMIN 1.2* 1.2*   Sedimentation Rate No results for input(s): ESRSEDRATE in the last 72 hours. C-Reactive Protein No results for input(s): CRP in the last 72 hours.  Microbiology: Recent  Results (from the past 240 hour(s))  Culture, respiratory (NON-Expectorated)     Status: None   Collection Time: 09/14/16  1:19 PM  Result Value Ref Range Status   Specimen Description TRACHEAL ASPIRATE  Final   Special Requests NONE  Final   Gram Stain   Final    ABUNDANT WBC PRESENT, PREDOMINANTLY PMN RARE GRAM NEGATIVE RODS MODERATE YEAST    Culture   Final    ABUNDANT PSEUDOMONAS AERUGINOSA MODERATE STENOTROPHOMONAS MALTOPHILIA PSEUDOMONAS AERUGINOSA MULTI-DRUG RESISTANT ORGANISM Results Called toKarlene Einstein PHARMD, AT [place] 09/25/2016 BY D. VANHOOK    Report Status 10/03/2016 FINAL  Final   Organism ID, Bacteria STENOTROPHOMONAS MALTOPHILIA  Final   Organism ID, Bacteria PSEUDOMONAS AERUGINOSA  Final      Susceptibility   Pseudomonas aeruginosa - MIC*    CEFTAZIDIME 8 SENSITIVE Sensitive     CIPROFLOXACIN >=4 RESISTANT Resistant     GENTAMICIN >=16 RESISTANT Resistant     IMIPENEM >=16 RESISTANT Resistant     CEFEPIME 16 INTERMEDIATE Intermediate     * ABUNDANT PSEUDOMONAS AERUGINOSA   Stenotrophomonas maltophilia - MIC*    LEVOFLOXACIN 1 SENSITIVE Sensitive     TRIMETH/SULFA <=20 SENSITIVE Sensitive     * MODERATE STENOTROPHOMONAS MALTOPHILIA    Studies/Results: Dg Chest Port 1 View  Result Date: 09/13/2016 CLINICAL DATA:  Tracheostomy. EXAM: PORTABLE CHEST 1 VIEW COMPARISON:  09/18/2016. FINDINGS: Tracheostomy tube and right IJ line stable position. Heart size normal. Persistent right lower lobe infiltrate with atelectasis in the right  lung base. Mild infiltrate left mid and lower lung. Mild right pleural effusion. No pneumothorax. IMPRESSION: 1.  Lines and tubes in stable position. 2. Persistent right lower lobe infiltrate and atelectasis with small right pleural effusion. Mild infiltrate left mid and lower lung. Electronically Signed   By: Marcello Moores  Register   On: 09/07/2016 07:44   Dg Chest Port 1 View  Result Date: 09/18/2016 CLINICAL DATA:  30 year old  Quadriplegic male with respiratory failure. Right lower lobe consolidation. Tracheostomy tube. Initial encounter. EXAM: PORTABLE CHEST 1 VIEW COMPARISON:  09/15/2016 and earlier, including CT Abdomen and Pelvis 09/11/2016 FINDINGS: Portable AP semi upright view at 0605 hours. Stable cardiac size and mediastinal contours. Tracheostomy tube positioning appears adequate. Stable right IJ central line. Continued confluent right lung base opacity seen in part due to consolidation and also peribronchial opacity which involve both the right middle and lower lobes on the recent CT. No superimposed pneumothorax or pulmonary edema. The small right pleural effusion seen recently by CT is not evident. Overall ventilation has not significantly changed since 09/11/2016. IMPRESSION: 1. Right lung base consolidation and peribronchial opacity compatible with multilobar pneumonia not significantly improved since 09/11/2016 (as seen on CT Abdomen and Pelvis that date). 2. No new cardiopulmonary abnormality. Electronically Signed   By: Genevie Ann M.D.   On: 09/18/2016 08:31     Assessment/Plan: C6 Spinal Infarct Quadraplegia Sepsis Streptococcal constellatus bacteremia Sacral Osteomyelitis Bone Bx 11-22 ESBL klebsiella Enterococcus (S- amp) Strep constellatus HCAP, aspiration Chronic Trach CXR worsening vascular congestion.   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

## 2016-09-19 NOTE — Progress Notes (Signed)
Name: Jason [name] MRN: [id number] DOB: 04-30-1986    ADMISSION DATE:  08/16/2016 CONSULTATION DATE:  11/21  REFERRING MD :  [email or web address] ([facility])   CHIEF COMPLAINT:  Lethargy, vent mgmt   BRIEF PATIENT DESCRIPTION:   30 yo male developed altered mental status after receiving azactam in setting of chronic wound infections (sacrum/heels).  He has hx of C6 spine injury with quadriplegia, tracheostomy status.  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, chronic Rt cerebellar infarct, atrophy  MICROBIOLOGY:  Blood Ctx x3 11/19:  3/3 Streptococcus constellatus  Urine Ctx 11/19:  Acinetobacter & Enterococcus faecalis MRSA PCR 11/20:  Positive Sacral Bone Ctx 11/22:  ESBL Kleb pna, Enterococcus faecalis, strep constellatus Blood Ctx x2 11/23:  Negative  Tracheal Asp Ctx 11/23:  Klebsiella pneumoniae & Acinetobacter Tracheal Asp Ctx 11/24:  Klebsiella pneumoniae & Acinetobacter Tracheal Asp Ctx 11/30:  Pseudomonas aeruginosa & Stenotrophomonas   SUBJECTIVE:   Remains on propofol/versed heavily sedated  VITAL SIGNS: BP 137/76   Pulse 76   Temp 99.6 F (37.6 C) (Oral)   Resp 20   Ht 5\' 8"  (1.727 m)   Wt 110 lb (49.9 kg)   SpO2 100%   BMI 16.73 kg/m   INTAKE/OUTPUT: I/O last 3 completed shifts: In: [id number] [I.V.:3195.6; Other:120; NG/GT:1855.4; IV Piggyback:745] Out: [id number] [Urine:4200; Stool:1425]  PHYSICAL EXAMINATION: General: chronically ill appearing young male, some facial movement, neck ,arms. Sedated on vent HEENT: Tracheostomy clean Cardiovascular:  s1 s2 Regular rhythm Pulmonary: resps even non labored on vent, few scattered ronchi rt base Abdomen: Soft. Normal bowel sounds. Nondistended. , colostomy , TF via peg Neurological: perr,  moves etx upper, head, not fc   CMP Latest Ref Rng & Units 09/09/2016 09/18/2016 09/17/2016  Glucose 65 - 99 mg/dL 62(L) 127(H) 171(H)  BUN 6 - 20 mg/dL 23(H) 22(H) 22(H)  Creatinine 0.61 - 1.24 mg/dL 0.44(L) 0.50(L) 0.54(L)  Sodium 135 - 145 mmol/L 143 143 138  Potassium 3.5 - 5.1 mmol/L 3.6 3.5 3.8  Chloride 101 - 111 mmol/L 124(H) 123(H) 116(H)  CO2 22 - 32 mmol/L 14(L) 15(L) 15(L)  Calcium 8.9 - 10.3 mg/dL 7.7(L) 7.6(L) 7.4(L)  Total Protein 6.5 - 8.1 g/dL - - -  Total Bilirubin 0.3 - 1.2 mg/dL - - -  Alkaline Phos 38 - 126 U/L - - -  AST 15 - 41 U/L - - -  ALT 17 - 63 U/L - - -    CBC Latest Ref Rng & Units 10/04/2016 09/18/2016 09/17/2016  WBC 4.0 - 10.5 K/uL 18.0(H) 21.8(H) 19.9(H)  Hemoglobin 13.0 - 17.0 g/dL 7.7(L) 8.1(L) 6.2(LL)  Hematocrit 39.0 - 52.0 % 23.6(L) 25.2(L) 19.4(L)  Platelets 150 - 400 K/uL 502(H) 490(H) 453(H)    Imaging: Dg Chest Port 1 View  Result Date: 09/11/2016 CLINICAL DATA:  Tracheostomy. EXAM: PORTABLE CHEST 1 VIEW COMPARISON:  09/18/2016. FINDINGS: Tracheostomy tube and right IJ line stable position. Heart size normal. Persistent right lower lobe infiltrate with atelectasis in the right lung base. Mild infiltrate left mid and lower lung. Mild right pleural effusion. No pneumothorax. IMPRESSION: 1.  Lines and tubes in stable position. 2. Persistent right lower lobe infiltrate and atelectasis with small right pleural effusion. Mild infiltrate left mid and lower lung. Electronically Signed   By:  [place]   On: 09/28/2016 07:44   Dg Chest Port 1 View  Result Date: 09/18/2016 CLINICAL DATA:  30 year old Quadriplegic male with respiratory failure. Right lower lobe consolidation. Tracheostomy tube. Initial encounter. EXAM: PORTABLE CHEST 1 VIEW COMPARISON:  09/15/2016 and earlier, including CT Abdomen and Pelvis 09/11/2016 FINDINGS: Portable AP semi upright view at 0605 hours. Stable cardiac size and mediastinal contours. Tracheostomy tube  positioning appears adequate. Stable right IJ central line. Continued confluent right lung base opacity seen in part due to consolidation and also peribronchial opacity which involve both the right middle and lower lobes on the recent CT. No superimposed pneumothorax or pulmonary edema. The small right pleural effusion seen recently by CT is not evident. Overall ventilation has not significantly changed since 09/11/2016. IMPRESSION: 1. Right lung base consolidation and peribronchial opacity compatible with multilobar pneumonia not significantly improved since 09/11/2016 (as seen on CT Abdomen and Pelvis that date). 2. No new cardiopulmonary abnormality. Electronically Signed   By: Genevie Ann M.D.   On: 09/18/2016 08:31    ASSESSMENT / PLAN:  Status epilepticus >> possibly related to Abx. 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 - AEDs per neurology - neurology arrange for cervical puncture for CSF sampling, 12/13 scheduled per IR - continue diprivan per neurology - IR to do cervical spinal tap 12/13  Acute on chronic respiratory failure. Tracheostomy status. - full vent support  - f/u CXR  HCAP, recurrent UTI, osteomyelitis, sacral/heel wounds. - monitor off Abx - ID following - still has Pseudomonas and Stenotrophomonas in sputum from 12/08 >> ?if this is chronic colonization.  Anemia of critical illness. - f/u CBC after transfusion 12/11 12/12 6.2->8.1  Rt subclavian/brachial DVT 2nd to PICC line 11/27. Hx of PE s/p IVC filter. Chronic diastolic CHF. Hypotension 2nd to sedation. - pressors to keep MAP > 55 - midodrine  Severe protein calorie malnutrition. DM gastroparesis. - tube feeds(12/13 on hold for cervical puncture)  DM type 1. - SSI(d10 due to tf hold. Note received insulin despite npo, d10 increased to 75 cc/hr 12/13  No family at bedside  CC time 30 minutes  .[name] Minor ACNP [name] PCCM Pager [phone] till 3 pm If no answer page  [phone] 09/28/2016, 8:35 AM   Cervical puncture on hold until ASA cleared.  Sedated.  Trach site clean.  Basilar crackles.  HR regular.  Colostomy, suprapubic cath, G tube in place.  1+ edema.  CXR - RLL ASD  WBC 18, Hb 7.7, Creatinine 143, HCO3 14, Cl 124, Na 143  Assessment/plan:  Status epilepticus. - AEDS per neuro - IR to arrange for cervical puncture  Acute on chronic respiratory failure. - full vent support  Pseudomonas, Stenotrophomonas in sputum. - possibly colonization - monitor off Abx per ID  CC time by me independent of APP time 32 minutes.  Chesley Mires, MD [facility] Pulmonary/Critical Care 09/15/2016, 10:44 AM Pager:  [phone] After 3pm call: [phone]

## 2016-09-19 NOTE — Progress Notes (Signed)
NEURO HOSPITALIST PROGRESS NOTE   SUBJECTIVE:                                                                                                                       Heavily sedated. S/p trach.Tmax 100.4  No further clinical seizures noted. On c-EEG monitoring, formal reading pending. AEDs: keppra 1,500 mg BID, vimpat 200 mg BID, topamax 100 mg BID, depakote 1250 mg Q 6 h VPA level 43 yesterday.  OBJECTIVE:                                                                                                                           Vital signs in last 24 hours: Temp:  [93.2 F (34 C)-100.4 F (38 C)] 98.7 F (37.1 C) (12/13 0900) Pulse Rate:  [58-91] 74 (12/13 1159) Resp:  [17-27] 18 (12/13 1159) BP: (79-137)/(44-88) 114/79 (12/13 1159) SpO2:  [100 %] 100 % (12/13 1159) FiO2 (%):  [30 %] 30 % (12/13 1159) Weight:  [49.9 kg (110 lb)] 49.9 kg (110 lb) (12/13 0500)  Intake/Output from previous day: 12/12 0701 - 12/13 0700 In: 3683.4 [I.V.:2028; NG/GT:1135.4; IV Piggyback:520] Out: 2875 [Urine:1825; Stool:1050] Intake/Output this shift: Total I/O In: 1324.3 [I.V.:926.8; Other:60; NG/GT:97.5; IV Piggyback:240] Out: 250 [Urine:250] Nutritional status: Diet NPO time specified  Past Medical History:  Diagnosis Date  . Anemia   . Anxiety   . Arthritis   . Asthma   . Chronic osteomyelitis involving pelvic region and thigh ([place]) 06/18/2016  . Colostomy in place [facility])   . Complication of anesthesia    difficulty waking up, will have sudden drop in BP and O2 sats  . Depression   . Diabetic neuropathy ([place])   . Diabetic retinopathy ([place])   . Dysrhythmia    usually due to medications  . Family history of anesthesia complication    Pt mother can't have epidural procedures  . Fibromyalgia   . Gastroparesis   . GERD (gastroesophageal reflux disease)   . H/O constipation   . H/O respiratory failure   . H/O thrombosis   . Hx MRSA infection    on  face  . Multiple allergies 11/02/2015  . Pneumonia   . Recurrent UTI 11/02/2015  . Seizures ([place]) 2017  one time during admission in hosptital  . 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 Syncope 02/16/2015  . Tracheostomy present ([place])   . Type I diabetes mellitus ([place])    sees Dr. Loanne Drilling     Neurologic ROS negative with exception of above. Musculoskeletal ROS quadriplegia.   Physical exam:  Constitutional: critically ill, on the vent s/p trach Eyes: no jaundice or exophthalmos.  Head: normocephalic. Neck: supple, no bruits, no JVD. Cardiac: no murmurs. Lungs: clear. Abdomen: soft, no tender, no mass. Extremities: UE edema, no clubbing, or cyanosis.  Skin: no rash   Neurologic Exam:  Neuro: Exam confounded by heavy sedation  MS: does not respond [id number] unreactive, face symmetric Motor: no spontaneous or pain induced movement  Sensory: no reaction to pain   Lab Results: Lab Results  Component Value Date/Time   CHOL 161 09/16/2016 02:20 AM   Lipid Panel  Recent Labs   0400  TRIG 133    Studies/Results: Dg Chest Port 1 View  Result Date: 09/10/2016 CLINICAL DATA:  Tracheostomy. EXAM: PORTABLE CHEST 1 VIEW COMPARISON:  09/18/2016. FINDINGS: Tracheostomy tube and right IJ line stable position. Heart size normal. Persistent right lower lobe infiltrate with atelectasis in the right lung base. Mild infiltrate left mid and lower lung. Mild right pleural effusion. No pneumothorax. IMPRESSION: 1.  Lines and tubes in stable position. 2. Persistent right lower lobe infiltrate and atelectasis with small right pleural effusion. Mild infiltrate left mid and lower lung. Electronically Signed   By: Marcello Moores  Register   On:  07:44   Dg Chest Port 1 View  Result Date: 09/18/2016 CLINICAL DATA:  30 year old Quadriplegic male with respiratory failure. Right lower lobe consolidation. Tracheostomy tube. Initial encounter. EXAM: PORTABLE CHEST 1  VIEW COMPARISON:  09/15/2016 and earlier, including CT Abdomen and Pelvis 09/11/2016 FINDINGS: Portable AP semi upright view at 0605 hours. Stable cardiac size and mediastinal contours. Tracheostomy tube positioning appears adequate. Stable right IJ central line. Continued confluent right lung base opacity seen in part due to consolidation and also peribronchial opacity which involve both the right middle and lower lobes on the recent CT. No superimposed pneumothorax or pulmonary edema. The small right pleural effusion seen recently by CT is not evident. Overall ventilation has not significantly changed since 09/11/2016. IMPRESSION: 1. Right lung base consolidation and peribronchial opacity compatible with multilobar pneumonia not significantly improved since 09/11/2016 (as seen on CT Abdomen and Pelvis that date). 2. No new cardiopulmonary abnormality. Electronically Signed   By: Genevie Ann M.D.   On: 09/18/2016 08:31    MEDICATIONS                                                                                                                       I have reviewed the patient's current medications.  ASSESSMENT/PLAN:  30 y/o male with new onset super refratory generalized status epilepticus(NORSE). Concern for CNS infection as the etiology of patient NORSE but had broad antibiotic coverage. Unable to perform LP due to sacral infection extending up into L1, 2 and cysternal puncture by IR deferred for Friday due to holding aspirin. Awaiting c-EEG report from today. In the meantime, continue current AEDs: keppra 1,500 mg BID, vimpat 200 mg BID, topamax 100 mg BID, and depakote 1250 mg Q 6 h    Dorian Pod, MD [facility] [phone]  09/26/2016, 12:17 PM

## 2016-09-19 NOTE — Significant Event (Addendum)
Hypoglycemic Event  CBG: 35  Treatment: amp of D50  Symptoms: Cold/Clammy  Follow-up CBG: Time: 0814 CBG Result: 125  Possible Reasons for Event: Tube feeds was held due to procedure in the AM  Comments/MD notified: [name] Minor with CCM and Dr. Cristina Gong, [facility]

## 2016-09-19 NOTE — Progress Notes (Signed)
EEG maint complete. No further skin breakdown noted.

## 2016-09-19 NOTE — Procedures (Signed)
Continuous Video-EEG Monitoring Report  Patient: Jason [name], Jason [name]     EEG No.ID: [id number]     DOB: Nov 05, 1985 Age: 30 Room#2S05  MED REC NO: [id number] Gender: Male   TECH: [place]     Physician: Winfield Cunas     Referring Physician: Roland Rack     Report Date: 10/02/2016       Study Duration: 09/18/2016 07:30 to 09/13/2016 07:30 CPT Code:  [id number] Diagnosis:  Altered mental status (R41.82)  History: This is a 30 year old male presenting with altered mental status.  Video-EEG monitoring was performed to evaluate for seizures.  Technical Details:  Long-term video-EEG monitoring was performed using standard setting per the guidelines.  Briefly, a minimum of 21 electrodes were placed on scalp according to the International 10-20 or/and 10-10 Systems.  Supplemental electrodes were placed as needed.  Single EKG electrode was also used to detect cardiac arrhythmia.  Patient's behavior was continuously recorded on video simultaneously with EEG.  A minimum of 16 channels were used for data display.  Each epoch of study was reviewed manually daily and as needed using standard referential and bipolar montages.    EEG Description:  The background was discontinuous.  At the sensitivity setting of 2 uV/mm, intermittent generalized polymorphic delta slow waves were seen from a suppressed background, consistent with burst suppression pattern.  The suppression ratio was, on average, more than 60%.  The bursts became prolonged at times, typically upon external stimulation (such as nursing care), suggesting reactivity.  No posterior dominant rhythm or sleep architecture was recorded.  No epileptiform discharges or seizures were present.  Impression:  This is an abnormal EEG due to the presence of burst suppression pattern, suggesting severe encephalopathy, but medication effect could not be excluded (such as benzodiazepine, propofol).  Notably, the background reactivity appeared to be preserved, which is a  favorable prognostic sign.    Reading Physician: Winfield Cunas, MD, PhD

## 2016-09-19 NOTE — Progress Notes (Signed)
Patient ID: Jason [name], male   DOB: 01/05/1986, 30 y.o.   MRN: [id number]  Pt was scheduled for cervical puncture in IR with anesthesia today. Although we don't normally HOLD ASA; in this case ; with location of procedure-- Dr Estanislado Pandy needs ASA held x 3 days.  See order for HOLD ASA today; Thur and Fri  Plan for procedure 12/15

## 2016-09-20 ENCOUNTER — Inpatient Hospital Stay (HOSPITAL_COMMUNITY): Payer: Medicaid Other

## 2016-09-20 DIAGNOSIS — E876 Hypokalemia: Secondary | ICD-10-CM

## 2016-09-20 DIAGNOSIS — Z86718 Personal history of other venous thrombosis and embolism: Secondary | ICD-10-CM

## 2016-09-20 LAB — CBC
HEMATOCRIT: 24.1 % — AB (ref 39.0–52.0)
Hemoglobin: 7.7 g/dL — ABNORMAL LOW (ref 13.0–17.0)
MCH: 27.8 pg (ref 26.0–34.0)
MCHC: 32 g/dL (ref 30.0–36.0)
MCV: 87 fL (ref 78.0–100.0)
Platelets: 505 10*3/uL — ABNORMAL HIGH (ref 150–400)
RBC: 2.77 MIL/uL — AB (ref 4.22–5.81)
RDW: 19.3 % — AB (ref 11.5–15.5)
WBC: 20.8 10*3/uL — AB (ref 4.0–10.5)

## 2016-09-20 LAB — GLUCOSE, CAPILLARY
GLUCOSE-CAPILLARY: 103 mg/dL — AB (ref 65–99)
GLUCOSE-CAPILLARY: 147 mg/dL — AB (ref 65–99)
Glucose-Capillary: 134 mg/dL — ABNORMAL HIGH (ref 65–99)
Glucose-Capillary: 152 mg/dL — ABNORMAL HIGH (ref 65–99)
Glucose-Capillary: 155 mg/dL — ABNORMAL HIGH (ref 65–99)
Glucose-Capillary: 182 mg/dL — ABNORMAL HIGH (ref 65–99)
Glucose-Capillary: 70 mg/dL (ref 65–99)

## 2016-09-20 LAB — BASIC METABOLIC PANEL
ANION GAP: 7 (ref 5–15)
BUN: 19 mg/dL (ref 6–20)
CO2: 13 mmol/L — ABNORMAL LOW (ref 22–32)
Calcium: 7.6 mg/dL — ABNORMAL LOW (ref 8.9–10.3)
Chloride: 120 mmol/L — ABNORMAL HIGH (ref 101–111)
Creatinine, Ser: 0.5 mg/dL — ABNORMAL LOW (ref 0.61–1.24)
Glucose, Bld: 180 mg/dL — ABNORMAL HIGH (ref 65–99)
POTASSIUM: 3 mmol/L — AB (ref 3.5–5.1)
SODIUM: 140 mmol/L (ref 135–145)

## 2016-09-20 LAB — CORTISOL: CORTISOL PLASMA: 16.5 ug/dL

## 2016-09-20 MED ORDER — DEXTROSE 50 % IV SOLN
INTRAVENOUS | Status: AC
  Administered 2016-09-20: 50 mL
  Filled 2016-09-20: qty 50

## 2016-09-20 MED ORDER — POTASSIUM CHLORIDE 20 MEQ/15ML (10%) PO SOLN
40.0000 meq | Freq: Once | ORAL | Status: AC
  Administered 2016-09-20: 40 meq
  Filled 2016-09-20: qty 30

## 2016-09-20 NOTE — Progress Notes (Signed)
INFECTIOUS DISEASE PROGRESS NOTE  ID: Jason [name] is a 30 y.o. male with  Active Problems:   Gastroparesis due to DM ([place])   Altered mental status   Neurogenic bladder   Suprapubic catheter ([place])   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 ([place])   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([place])   History of pulmonary embolism   DVT (deep venous thrombosis) ([name])   Severe protein-calorie malnutrition ([place])   Dysphagia, pharyngoesophageal phase   Decubitus ulcer of right perineal ischial region, stage 4 ([name])   Decubitus ulcer of left perineal ischial region, stage 4 ([name])   Sacral decubitus ulcer, stage IV ([name])   Multiple allergies   Recurrent UTI   Respiratory failure, chronic ([name])   Aspiration pneumonia of right lower lobe ([name])   Seizures ([name])   Anxiety state   Anemia of chronic disease   Hyponatremia   Encephalopathy   Other acute osteomyelitis, multiple sites ([place])   Lower GI bleed   Viridans streptococci infection   Coma ([place])   Septic shock ([name])   Anaphylactic syndrome   Acute hypoxemic respiratory failure ([name])   Enterococcal infection   Thrombocytopenia ([name])   Status epilepticus ([name])   Chronic respiratory failure with hypoxia ([name])   Acute respiratory acidosis  Subjective: Afebrile overnight.  Had some eye twitching but no other seizure like movements per RN overnight.    Abtx:  Anti-infectives    Start     Dose/Rate Route Frequency Ordered Stop   09/11/16 2200  ceFEPIme (MAXIPIME) 2 g in dextrose 5 % 50 mL IVPB  Status:  Discontinued     2 g 100 mL/hr over 30 Minutes Intravenous Every 8 hours 09/11/16 1830 09/11/16 1838   09/11/16 2200  ceFEPIme (MAXIPIME) 2 g in dextrose 5 % 50 mL IVPB  Status:  Discontinued     2 g 100 mL/hr over 30 Minutes Intravenous Every 8 hours 09/11/16 1838 09/15/16 1441   09/11/16 2000  ceFEPIme (MAXIPIME) 2 g in dextrose 5 % 50 mL IVPB  Status:  Discontinued     2 g 100 mL/hr over 30 Minutes Intravenous Every 8  hours 09/11/16 1813 09/11/16 1830   09/11/16 1400  meropenem (MERREM) 1 g in sodium chloride 0.9 % 100 mL IVPB  Status:  Discontinued     1 g 200 mL/hr over 30 Minutes Intravenous Every 8 hours 09/11/16 1150 09/11/16 1813   09/11/16 1400  sulfamethoxazole-trimethoprim (BACTRIM) 240 mg of trimethoprim in dextrose 5 % 250 mL IVPB  Status:  Discontinued     240 mg of trimethoprim 265 mL/hr over 60 Minutes Intravenous Every 8 hours 09/11/16 1156 09/15/16 1441   09/10/16 2100  levofloxacin (LEVAQUIN) IVPB 750 mg  Status:  Discontinued     750 mg 100 mL/hr over 90 Minutes Intravenous Every 24 hours 09/10/16 1849 09/11/16 1115   09/10/16 2000  ceFEPIme (MAXIPIME) 2 g in dextrose 5 % 50 mL IVPB  Status:  Discontinued     2 g 100 mL/hr over 30 Minutes Intravenous Every 8 hours 09/10/16 1847 09/11/16 1140   09/08/16 1700  imipenem-cilastatin (PRIMAXIN) 1,000 mg in sodium chloride 0.9 % 250 mL IVPB  Status:  Discontinued     1,000 mg 250 mL/hr over 60 Minutes Intravenous Every 8 hours 09/08/16 1159 09/10/16 1736   09/01/16 1300  anidulafungin (ERAXIS) 100 mg in sodium chloride 0.9 % 100 mL IVPB  Status:  Discontinued     100 mg over 90  Minutes Intravenous Every 24 hours 08/31/16 1240 09/02/16 1252   09/01/16 1200  vancomycin (VANCOCIN) IVPB 750 mg/150 ml premix  Status:  Discontinued     750 mg 150 mL/hr over 60 Minutes Intravenous Every 24 hours 09/01/16 1145 09/01/16 1712   08/31/16 1700  imipenem-cilastatin (PRIMAXIN) 500 mg in sodium chloride 0.9 % 100 mL IVPB  Status:  Discontinued     500 mg 200 mL/hr over 30 Minutes Intravenous Every 8 hours 08/31/16 1240 09/08/16 1159   08/31/16 1300  anidulafungin (ERAXIS) 200 mg in sodium chloride 0.9 % 200 mL IVPB     200 mg over 180 Minutes Intravenous  Once 08/31/16 1240 08/31/16 1810   09/04/2016 0557  tigecycline (TYGACIL) 50 mg in sodium chloride 0.9 % 100 mL IVPB  Status:  Discontinued     50 mg 200 mL/hr over 30 Minutes Intravenous Every 12 hours  08/28/16 1758 08/31/16 1151   08/28/16 1800  tigecycline (TYGACIL) 100 mg in sodium chloride 0.9 % 100 mL IVPB  Status:  Discontinued     100 mg 200 mL/hr over 30 Minutes Intravenous  Once 08/28/16 1758 08/31/16 1424   08/28/16 1300  linezolid (ZYVOX) IVPB 600 mg  Status:  Discontinued     600 mg 300 mL/hr over 60 Minutes Intravenous Every 12 hours 08/28/16 1239 08/31/16 2014   08/28/16 0200  aztreonam (AZACTAM) 1 g in dextrose 5 % 50 mL IVPB  Status:  Discontinued     1 g 100 mL/hr over 30 Minutes Intravenous Every 8 hours 08/27/16 1802 08/28/16 1232   08/27/16 2000  vancomycin (VANCOCIN) 500 mg in sodium chloride 0.9 % 100 mL IVPB  Status:  Discontinued     500 mg 100 mL/hr over 60 Minutes Intravenous Every 12 hours 08/27/16 1802 08/28/16 1239   08/27/16 1800  aztreonam (AZACTAM) 2 g in dextrose 5 % 50 mL IVPB     2 g 100 mL/hr over 30 Minutes Intravenous  Once 08/27/16 1634 08/27/16 1852   08/27/16 1600  levofloxacin (LEVAQUIN) IVPB 750 mg  Status:  Discontinued     750 mg 100 mL/hr over 90 Minutes Intravenous Every 24 hours 08/27/2016 1324 08/14/2016 1522   08/27/16 0000  vancomycin (VANCOCIN) 500 mg in sodium chloride 0.9 % 100 mL IVPB  Status:  Discontinued     500 mg 100 mL/hr over 60 Minutes Intravenous Every 12 hours 08/10/2016 1324 08/20/2016 1522   08/30/2016 2200  aztreonam (AZACTAM) 1 g in dextrose 5 % 50 mL IVPB  Status:  Discontinued     1 g 100 mL/hr over 30 Minutes Intravenous Every 8 hours 09/06/2016 1324 08/24/2016 1522   09/05/2016 1600  linezolid (ZYVOX) tablet 600 mg  Status:  Discontinued     600 mg Oral Every 12 hours 08/19/2016 1458 08/27/16 1347   09/02/2016 1300  levofloxacin (LEVAQUIN) IVPB 750 mg  Status:  Discontinued     750 mg 100 mL/hr over 90 Minutes Intravenous  Once 09/02/2016 1246 08/18/2016 1522   08/27/2016 1300  aztreonam (AZACTAM) 2 g in dextrose 5 % 50 mL IVPB  Status:  Discontinued     2 g 100 mL/hr over 30 Minutes Intravenous  Once 09/01/2016 1246 09/01/2016 1522    08/27/2016 1300  vancomycin (VANCOCIN) IVPB 1000 mg/200 mL premix  Status:  Discontinued     1,000 mg 200 mL/hr over 60 Minutes Intravenous  Once 08/30/2016 1246 08/20/2016 1522      Medications:  Scheduled: . artificial tears  Both Eyes Q8H  . baclofen  10 mg Oral QID  . budesonide (PULMICORT) nebulizer solution  0.5 mg Nebulization BID  . chlorhexidine gluconate (MEDLINE KIT)  15 mL Mouth Rinse BID  . insulin aspart  1-3 Units Subcutaneous Q4H  . insulin glargine  5 Units Subcutaneous QHS  . ipratropium  0.5 mg Nebulization BID  . lacosamide (VIMPAT) IV  100 mg Intravenous Q12H  . levETIRAcetam  1,500 mg Intravenous Q12H  . mouth rinse  15 mL Mouth Rinse 10 times per day  . midazolam  40 mg Intravenous Once  . midodrine  20 mg Per Tube TID WC  . oxybutynin  5 mg Per Tube BID  . pantoprazole sodium  40 mg Per Tube Daily  . scopolamine  1 patch Transdermal Q72H  . topiramate  100 mg Oral BID  . valproate sodium  1,250 mg Intravenous Q6H    Objective: Vital signs in last 24 hours: Temp:  [97.7 F (36.5 C)-99.5 F (37.5 C)] 97.7 F (36.5 C) (12/14 0800) Pulse Rate:  [71-109] 76 (12/14 0836) Resp:  [2-24] 20 (12/14 0836) BP: (85-129)/(56-88) 104/77 (12/14 0836) SpO2:  [100 %] 100 % (12/14 0836) FiO2 (%):  [30 %] 30 % (12/14 0836) Weight:  [113 lb (51.3 kg)] 113 lb (51.3 kg) (12/14 0500)   General appearance: no distress Resp: clear to auscultation bilaterally Cardio: regular rate and rhythm GI: abnormal findings:  hypoactive bowel sounds  Remains heavily sedated. On trach-vent.   Lab Results  Recent Labs  09/09/2016 0400 09/20/16 0500  WBC 18.0* 20.8*  HGB 7.7* 7.7*  HCT 23.6* 24.1*  NA 143 140  K 3.6 3.0*  CL 124* 120*  CO2 14* 13*  BUN 23* 19  CREATININE 0.44* 0.50*   Liver Panel  Recent Labs  09/18/16 0440 09/13/2016 0400  ALBUMIN 1.2* 1.2*   Sedimentation Rate No results for input(s): ESRSEDRATE in the last 72 hours. C-Reactive Protein No results for  input(s): CRP in the last 72 hours.  Microbiology: Recent Results (from the past 240 hour(s))  Culture, respiratory (NON-Expectorated)     Status: None   Collection Time: 09/14/16  1:19 PM  Result Value Ref Range Status   Specimen Description TRACHEAL ASPIRATE  Final   Special Requests NONE  Final   Gram Stain   Final    ABUNDANT WBC PRESENT, PREDOMINANTLY PMN RARE GRAM NEGATIVE RODS MODERATE YEAST    Culture   Final    ABUNDANT PSEUDOMONAS AERUGINOSA MODERATE STENOTROPHOMONAS MALTOPHILIA PSEUDOMONAS AERUGINOSA MULTI-DRUG RESISTANT ORGANISM Results Called toKarlene Einstein PHARMD, AT [facility] 09/07/2016 BY D. VANHOOK    Report Status 09/08/2016 FINAL  Final   Organism ID, Bacteria STENOTROPHOMONAS MALTOPHILIA  Final   Organism ID, Bacteria PSEUDOMONAS AERUGINOSA  Final      Susceptibility   Pseudomonas aeruginosa - MIC*    CEFTAZIDIME 8 SENSITIVE Sensitive     CIPROFLOXACIN >=4 RESISTANT Resistant     GENTAMICIN >=16 RESISTANT Resistant     IMIPENEM >=16 RESISTANT Resistant     CEFEPIME 16 INTERMEDIATE Intermediate     * ABUNDANT PSEUDOMONAS AERUGINOSA   Stenotrophomonas maltophilia - MIC*    LEVOFLOXACIN 1 SENSITIVE Sensitive     TRIMETH/SULFA <=20 SENSITIVE Sensitive     * MODERATE STENOTROPHOMONAS MALTOPHILIA    Studies/Results: Dg Chest Port 1 View  Result Date: 09/20/2016 CLINICAL DATA:  Respiratory failure. EXAM: PORTABLE CHEST 1 VIEW COMPARISON:  Radiograph of September 19, 2016. FINDINGS: Stable cardiomediastinal silhouette. Tracheostomy tube and right  internal jugular catheter are unchanged in position. No pneumothorax is noted. Stable minimal left perihilar and basilar subsegmental atelectasis is noted. Stable right perihilar and basilar opacity is noted concerning for pneumonia or edema. Bony thorax is unremarkable. IMPRESSION: Stable support apparatus. Stable right right lung opacity as described above. Electronically Signed   By: Marijo Conception, M.D.   On: 09/20/2016  07:52   Dg Chest Port 1 View  Result Date: 09/21/2016 CLINICAL DATA:  Tracheostomy. EXAM: PORTABLE CHEST 1 VIEW COMPARISON:  09/18/2016. FINDINGS: Tracheostomy tube and right IJ line stable position. Heart size normal. Persistent right lower lobe infiltrate with atelectasis in the right lung base. Mild infiltrate left mid and lower lung. Mild right pleural effusion. No pneumothorax. IMPRESSION: 1.  Lines and tubes in stable position. 2. Persistent right lower lobe infiltrate and atelectasis with small right pleural effusion. Mild infiltrate left mid and lower lung. Electronically Signed   By: Marcello Moores  Register   On: 10/03/2016 07:44     Assessment/Plan: C6 Spinal Infarct Quadraplegia Sepsis Streptococcal constellatus Sacral Osteomyelitis Bone Bx 11-22 ESBL klebsiella Enterococcus (S- amp) Strep constellatus HCAP, aspiration Chronic Trach CXR worsening vascular congestion.   11-23Acinetobacter, Klebsiella 11-30 stenotrophomonas, pseudomonas Status epiliepticus on keppra, vimpat, topamax, depakote, and versed.  RUE DVT Infarct of L paracentral pons on MRI Hypokalemia,hypomagnesemia   Total days of antibiotics: 18 --> off 12-9 12-4 cefepime, bactrim >> 12-9 Imipenem 12-2 >>12-4 Tygacil 11/21 - 11/24 Vancomycin 11/19 - 11/25 Eraxis 11/24 - 11/26 Aztreonam 11/19 - 11/21 Levaquin 11/19 - 11/20 Zyvox 11/19 - 11/24    Infiltrates unchanged on CXR on right side. WBC remains high.   Holding anbx for resolution of seizures as there was concern that abx was lowering his seizure threshold. Cervical puncture pending, planned for 12/15.  continue to watch neuro/mental status - may consider going down on sedative and watch his response.        Ah[place], [place] Infectious [facility] (pager) [phone] [email or web address] 09/20/2016, 9:30 AM  LOS: 25 days

## 2016-09-20 NOTE — Procedures (Signed)
Continuous Video-EEG Monitoring Report   Patient: Jason [name], Jason [name]        EEG No.ID: 69-        DOB: 06/08/1986 Age: 30 Room#2S05  MED REC NO: [id number] Gender: Male    TECH: [place]        Physician: Winfield Cunas        Referring Physician: Roland Rack        Report Date: 09/20/2016            Study Duration:         10/03/2016 07:30 to 09/20/2016 07:30 CPT Code:                  [id number] Diagnosis:                  Altered mental status (R41.82)   History: This is a 30 year old male presenting with altered mental status.  Video-EEG monitoring was performed to evaluate for seizures.   Technical Details:  Long-term video-EEG monitoring was performed using standard setting per the guidelines.  Briefly, a minimum of 21 electrodes were placed on scalp according to the International 10-20 or/and 10-10 Systems.  Supplemental electrodes were placed as needed.  Single EKG electrode was also used to detect cardiac arrhythmia.  Patient's behavior was continuously recorded on video simultaneously with EEG.  A minimum of 16 channels were used for data display.  Each epoch of study was reviewed manually daily and as needed using standard referential and bipolar montages.     EEG Description:  The background was discontinuous, consisting of periods of suppression (amplitude <10 uV) and periods of generalized polymorphic delta to alpha range activity.  The suppression ratio varied from time to time, but overall was less than 50%, thus technically no longer meeting the criteria for burst suppression pattern.  No posterior dominant rhythm or sleep architecture was recorded.  No epileptiform discharges or seizures were present.   Impression:  This is an abnormal EEG due to the presence of background discontinuity. suggesting severe encephalopathy, but medication effect could not be excluded (such as benzodiazepine, propofol).  Compared with the last epoch, current epoch showed slightly less suppression  ratio, suggesting interval improvement from electrographic standpoint (but could also be due to down titration of sedatives).  Clinical correlation is advised.     Reading Physician: Winfield Cunas, MD, PhD

## 2016-09-20 NOTE — Progress Notes (Signed)
LTM taken down per Neurology Dr. Armida Sans. Skin breakdown at FP1, FP2, Ground, A2, A1, F3, F4, FZ

## 2016-09-20 NOTE — Progress Notes (Signed)
*  Preliminary Results* Bilateral upper extremity venous duplex completed. Right upper extremity is positive for acute versus subacute deep and superficial vein thrombosis involving the right subclavian, axillary, and basilic veins. The left lower extremity is negative for deep vein thrombosis. RUE DVT is essentially unchanged when compared to prior study 09/11/16.  09/20/2016 4:27 PM  Maudry Mayhew, BS, RVT, RDCS, RDMS

## 2016-09-20 NOTE — Progress Notes (Signed)
Name: Jason [name] MRN: [id number] DOB: September 17, 1986    ADMISSION DATE:  08/24/2016 CONSULTATION DATE:  11/21  REFERRING MD :  hongalgi ([facility])   CHIEF COMPLAINT:  Lethargy, vent mgmt   BRIEF PATIENT DESCRIPTION:   30 yo male developed altered mental status after receiving azactam in setting of chronic wound infections (sacrum/heels).  He has hx of C6 spine injury with quadriplegia, tracheostomy status.  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, chronic Rt cerebellar infarct, atrophy  MICROBIOLOGY:  Blood Ctx x3 11/19:  3/3 Streptococcus constellatus  Urine Ctx 11/19:  Acinetobacter & Enterococcus faecalis MRSA PCR 11/20:  Positive Sacral Bone Ctx 11/22:  ESBL Kleb pna, Enterococcus faecalis, strep constellatus Blood Ctx x2 11/23:  Negative  Tracheal Asp Ctx 11/23:  Klebsiella pneumoniae & Acinetobacter Tracheal Asp Ctx 11/24:  Klebsiella pneumoniae & Acinetobacter Tracheal Asp Ctx 11/30:  Pseudomonas aeruginosa & Stenotrophomonas   SUBJECTIVE:   Remains on propofol/versed heavily sedated  VITAL SIGNS: BP (!) 87/59   Pulse 92   Temp 97.7 F (36.5 C) (Oral)   Resp 13   Ht 5\' 8"  (1.727 m)   Wt 113 lb (51.3 kg)   SpO2 100%   BMI 17.18 kg/m   INTAKE/OUTPUT: I/O last 3 completed shifts: In: 6992.1 [I.V.:3927.1; Other:180; NG/GT:2087.5; IV Piggyback:797.5] Out: [id number] [Urine:2300; Stool:925]  PHYSICAL EXAMINATION: General: chronically ill appearing young male, some facial movement, neck ,arms. Sedated on vent HEENT: Tracheostomy clean Cardiovascular:  s1 s2 Regular rhythm Pulmonary: resps even non labored on vent, few scattered  bases Abdomen: Soft. Normal bowel sounds. Nondistended. , colostomy , TF via peg Neurological: perr,  moves etx upper, head, not fc   CMP Latest Ref Rng & Units 09/20/2016 09/26/2016 09/18/2016  Glucose 65 - 99 mg/dL 180(H) 62(L) 127(H)  BUN 6 - 20 mg/dL 19 23(H) 22(H)  Creatinine 0.61 - 1.24 mg/dL 0.50(L) 0.44(L) 0.50(L)  Sodium 135 - 145 mmol/L 140 143 143  Potassium 3.5 - 5.1 mmol/L 3.0(L) 3.6 3.5  Chloride 101 - 111 mmol/L 120(H) 124(H) 123(H)  CO2 22 - 32 mmol/L 13(L) 14(L) 15(L)  Calcium 8.9 - 10.3 mg/dL 7.6(L) 7.7(L) 7.6(L)  Total Protein 6.5 - 8.1 g/dL - - -  Total Bilirubin 0.3 - 1.2 mg/dL - - -  Alkaline Phos 38 - 126 U/L - - -  AST 15 - 41 U/L - - -  ALT 17 - 63 U/L - - -    CBC Latest Ref Rng & Units 09/20/2016 09/22/2016 09/18/2016  WBC 4.0 - 10.5 K/uL 20.8(H) 18.0(H) 21.8(H)  Hemoglobin 13.0 - 17.0 g/dL 7.7(L) 7.7(L) 8.1(L)  Hematocrit 39.0 - 52.0 % 24.1(L) 23.6(L) 25.2(L)  Platelets 150 - 400 K/uL 505(H) 502(H) 490(H)    Imaging: Dg Chest Port 1 View  Result Date: 09/20/2016 CLINICAL DATA:  Respiratory failure. EXAM: PORTABLE CHEST 1 VIEW COMPARISON:  Radiograph of September 19, 2016. FINDINGS: Stable cardiomediastinal silhouette. Tracheostomy tube and right internal jugular catheter are unchanged in position. No pneumothorax is noted. Stable minimal left perihilar and basilar subsegmental atelectasis is noted. Stable right perihilar and basilar opacity is noted concerning for pneumonia or edema. Bony thorax is unremarkable. IMPRESSION: Stable support apparatus. Stable right right lung opacity as described above. Electronically Signed   By: Sabino Dick  Brooke Bonito, M.D.   On: 09/20/2016 07:52   Dg Chest Port 1 View  Result Date:  CLINICAL DATA:  Tracheostomy. EXAM: PORTABLE CHEST 1 VIEW COMPARISON:  09/18/2016. FINDINGS: Tracheostomy tube and right IJ line stable position. Heart size normal. Persistent right lower lobe infiltrate with atelectasis in the right lung base. Mild infiltrate left mid and lower lung. Mild right pleural effusion. No pneumothorax.  IMPRESSION: 1.  Lines and tubes in stable position. 2. Persistent right lower lobe infiltrate and atelectasis with small right pleural effusion. Mild infiltrate left mid and lower lung. Electronically Signed   By: Marcello Moores  Register   On: 09/23/2016 07:44    ASSESSMENT / PLAN:  Status epilepticus >> possibly related to Abx. 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 - AEDs per neurology - neurology arrange for cervical puncture for CSF sampling, 12/15 scheduled per IR - continue diprivan/versed per neurology - IR to do cervical spinal tap 12/15  Acute on chronic respiratory failure. Tracheostomy status. - full vent support  - f/u CXR  HCAP, recurrent UTI, osteomyelitis, sacral/heel wounds. - monitor off Abx - ID following - still has Pseudomonas and Stenotrophomonas in sputum from 12/08 >> ?if this is chronic colonization.  Anemia of critical illness.  Recent Labs  09/21/2016 0400 09/20/16 0500  HGB 7.7* 7.7*    - f/u CBC after transfusion 12/11 12/12 6.2->8.1  Rt subclavian/brachial DVT 2nd to PICC line 11/27. Hx of PE s/p IVC filter. Chronic diastolic CHF. Hypotension 2nd to sedation. - pressors to keep MAP > 55 - midodrine  Severe protein calorie malnutrition. DM gastroparesis. - tube feeds  DM type 1. CBG (last 3)   Recent Labs  09/20/16 0001 09/20/16 0359 09/20/16 0751  GLUCAP 155* 152* 182*     - SSI(d10 due to tf hold. Note received insulin despite npo, d10 increased to 75 cc/hr 12/13. TF restarted 12/13 due to delay in CP procedure. D10 dc'd  No family at bedside  CC time 30 minutes  .[name] Minor ACNP [name] PCCM Pager [phone] till 3 pm If no answer page [phone] 09/20/2016, 8:30 AM  Mother concerned about arm swelling and low blood pressure.  Sedated.  RASS -4.  HR regular.  Scattered rhonchi.  Abd soft.  Hb 7.7, WBC 20.8, K 3  CXR - no change RLL ASD  Assessment/plan:  Status epilepticus. - AEDS per  neuro - IR to arrange for cervical puncture  Acute on chronic respiratory failure. - full vent support  Pseudomonas, Stenotrophomonas in sputum. - possibly colonization - monitor off Abx per ID  Low blood pressure. - check cortisol  Arm swelling noted by family. - doppler of arms b/l  CC time by me independent of APP time 32 minutes  Chesley Mires, MD [place] 09/20/2016, 11:22 AM Pager:  [phone] After 3pm call: [phone]

## 2016-09-21 LAB — GLUCOSE, CAPILLARY
GLUCOSE-CAPILLARY: 250 mg/dL — AB (ref 65–99)
GLUCOSE-CAPILLARY: 300 mg/dL — AB (ref 65–99)
GLUCOSE-CAPILLARY: 113 mg/dL — AB (ref 65–99)
GLUCOSE-CAPILLARY: 171 mg/dL — AB (ref 65–99)
Glucose-Capillary: 309 mg/dL — ABNORMAL HIGH (ref 65–99)
Glucose-Capillary: 102 mg/dL — ABNORMAL HIGH (ref 65–99)

## 2016-09-21 LAB — BASIC METABOLIC PANEL
ANION GAP: 7 (ref 5–15)
BUN: 18 mg/dL (ref 6–20)
CHLORIDE: 121 mmol/L — AB (ref 101–111)
CO2: 13 mmol/L — ABNORMAL LOW (ref 22–32)
CREATININE: 0.49 mg/dL — AB (ref 0.61–1.24)
Calcium: 8.2 mg/dL — ABNORMAL LOW (ref 8.9–10.3)
GFR calc non Af Amer: >60 mL/min (ref >60)
Glucose, Bld: 266 mg/dL — ABNORMAL HIGH (ref 65–99)
Potassium: 3.8 mmol/L (ref 3.5–5.1)
SODIUM: 141 mmol/L (ref 135–145)

## 2016-09-21 LAB — CBC
HEMATOCRIT: 24.9 % — AB (ref 39.0–52.0)
HEMOGLOBIN: 8 g/dL — AB (ref 13.0–17.0)
MCH: 28.4 pg (ref 26.0–34.0)
MCHC: 32.1 g/dL (ref 30.0–36.0)
MCV: 88.3 fL (ref 78.0–100.0)
PLATELETS: 476 10*3/uL — AB (ref 150–400)
RBC: 2.82 MIL/uL — ABNORMAL LOW (ref 4.22–5.81)
RDW: 19.8 % — ABNORMAL HIGH (ref 11.5–15.5)
WBC: 26.6 10*3/uL — AB (ref 4.0–10.5)

## 2016-09-21 LAB — MAGNESIUM: Magnesium: 1.7 mg/dL (ref 1.7–2.4)

## 2016-09-21 LAB — PHOSPHORUS: Phosphorus: 2.1 mg/dL — ABNORMAL LOW (ref 2.5–4.6)

## 2016-09-21 MED ORDER — DEXTROSE IN LACTATED RINGERS 5 % IV SOLN
INTRAVENOUS | Status: DC
  Administered 2016-09-21 – 2016-09-22 (×2): 75 mL/h via INTRAVENOUS
  Administered 2016-09-23 – 2016-09-25 (×4): via INTRAVENOUS

## 2016-09-21 MED ORDER — SODIUM CHLORIDE 0.9% FLUSH
10.0000 mL | INTRAVENOUS | Status: DC | PRN
  Administered 2016-09-23: 20 mL
  Filled 2016-09-21: qty 40

## 2016-09-21 MED ORDER — SODIUM CHLORIDE 0.9% FLUSH
10.0000 mL | Freq: Two times a day (BID) | INTRAVENOUS | Status: DC
  Administered 2016-09-21 – 2016-09-25 (×7): 10 mL
  Administered 2016-09-26 – 2016-09-28 (×2): 30 mL
  Administered 2016-09-29 – 2016-10-05 (×6): 10 mL

## 2016-09-21 MED ORDER — INSULIN ASPART 100 UNIT/ML ~~LOC~~ SOLN
0.0000 [IU] | SUBCUTANEOUS | Status: DC
  Administered 2016-09-21: 3 [IU] via SUBCUTANEOUS
  Administered 2016-09-21: 8 [IU] via SUBCUTANEOUS
  Administered 2016-09-22: 3 [IU] via SUBCUTANEOUS
  Administered 2016-09-23 (×2): 2 [IU] via SUBCUTANEOUS
  Administered 2016-09-23: 3 [IU] via SUBCUTANEOUS
  Administered 2016-09-23: 2 [IU] via SUBCUTANEOUS
  Administered 2016-09-24 (×2): 3 [IU] via SUBCUTANEOUS
  Administered 2016-09-24: 8 [IU] via SUBCUTANEOUS
  Administered 2016-09-25: 5 [IU] via SUBCUTANEOUS
  Administered 2016-09-25: 2 [IU] via SUBCUTANEOUS
  Administered 2016-09-25: 3 [IU] via SUBCUTANEOUS
  Administered 2016-09-25: 8 [IU] via SUBCUTANEOUS
  Administered 2016-09-25: 3 [IU] via SUBCUTANEOUS
  Administered 2016-09-25: 2 [IU] via SUBCUTANEOUS
  Administered 2016-09-26 (×3): 3 [IU] via SUBCUTANEOUS
  Administered 2016-09-27: 5 [IU] via SUBCUTANEOUS
  Administered 2016-09-27: 2 [IU] via SUBCUTANEOUS
  Administered 2016-09-27: 5 [IU] via SUBCUTANEOUS
  Administered 2016-09-27: 2 [IU] via SUBCUTANEOUS

## 2016-09-21 NOTE — Progress Notes (Signed)
Inpatient Diabetes Program Recommendations  AACE/ADA: New Consensus Statement on Inpatient Glycemic Control (2015)  Target Ranges:  Prepandial:   less than 140 mg/dL      Peak postprandial:   less than 180 mg/dL (1-2 hours)      Critically ill patients:  140 - 180 mg/dL   Lab Results  Component Value Date   GLUCAP 250 (H) 09/21/2016   HGBA1C 6.1 (H) 09/16/2016    Review of Glycemic Control  Results for FURIOUS, WRITER (MRN [id number]) as of 09/21/2016 09:11  Ref. Range 09/20/2016 16:36 09/20/2016 19:29 09/20/2016 21:45 09/20/2016 23:59 09/21/2016 04:13  Glucose-Capillary Latest Ref Range: 65 - 99 mg/dL 134 (H) 70 147 (H) 155 (H) 250 (H)    Diabetes history: Type 2 Outpatient Diabetes medications: Lantus 22 units qam Current orders for Inpatient glycemic control:  Lantus 5 units qday, Novolog 1-3 units q4h  Inpatient Diabetes Program Recommendations:  Patient's CBG this am are elevated because the basal insulin (Lantus) was held last night.   Agree with current medications for blood sugar management.   Gentry Fitz, RN, BA, MHA, CDE Diabetes Coordinator Inpatient Diabetes Program  [phone] (Team Pager) [phone] ([place]) 09/21/2016 9:26 AM

## 2016-09-21 NOTE — Progress Notes (Signed)
Name: Jason [name] MRN: [id number] DOB: April 01, 1986    ADMISSION DATE:  08/12/2016 CONSULTATION DATE:  11/21  REFERRING MD :  hongalgi ([facility])   CHIEF COMPLAINT:  Lethargy, vent mgmt   BRIEF PATIENT DESCRIPTION:   30 yo male developed altered mental status after receiving azactam in setting of chronic wound infections (sacrum/heels).  He has hx of C6 spine injury with quadriplegia, tracheostomy status.  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, chronic Rt cerebellar infarct, atrophy  MICROBIOLOGY:  Blood Ctx x3 11/19:  3/3 Streptococcus constellatus  Urine Ctx 11/19:  Acinetobacter & Enterococcus faecalis MRSA PCR 11/20:  Positive Sacral Bone Ctx 11/22:  ESBL Kleb pna, Enterococcus faecalis, strep constellatus Blood Ctx x2 11/23:  Negative  Tracheal Asp Ctx 11/23:  Klebsiella pneumoniae & Acinetobacter Tracheal Asp Ctx 11/24:  Klebsiella pneumoniae & Acinetobacter Tracheal Asp Ctx 11/30:  Pseudomonas aeruginosa & Stenotrophomonas   SUBJECTIVE:   Remains on high dose versed   VITAL SIGNS: BP 101/72   Pulse 71   Temp 97.8 F (36.6 C) (Oral)   Resp 18   Ht 5\' 8"  (1.727 m)   Wt 113 lb (51.3 kg)   SpO2 100%   BMI 17.18 kg/m   INTAKE/OUTPUT: I/O last 3 completed shifts: In: 7341.5 [I.V.:4111.5; Other:270; MK:[id number]; IV Piggyback:825] Out: [id number] [Urine:3475; Stool:1050]  PHYSICAL EXAMINATION: General: chronically ill appearing young male, some facial movement, neck ,arms. Sedated on vent HEENT: Tracheostomy clean, no drainage  Cardiovascular:  s1 s2 Regular rhythm Pulmonary: resps even non labored on vent, few scattered  bases Abdomen: Soft. Normal bowel sounds. Nondistended. , colostomy , TF via peg Neurological: perr, moves  etx upper, head, not fc EXt R>L edema   CMP Latest Ref Rng & Units 09/21/2016 09/20/2016 10/04/2016  Glucose 65 - 99 mg/dL 266(H) 180(H) 62(L)  BUN 6 - 20 mg/dL 18 19 23(H)  Creatinine 0.61 - 1.24 mg/dL 0.49(L) 0.50(L) 0.44(L)  Sodium 135 - 145 mmol/L 141 140 143  Potassium 3.5 - 5.1 mmol/L 3.8 3.0(L) 3.6  Chloride 101 - 111 mmol/L 121(H) 120(H) 124(H)  CO2 22 - 32 mmol/L 13(L) 13(L) 14(L)  Calcium 8.9 - 10.3 mg/dL 8.2(L) 7.6(L) 7.7(L)  Total Protein 6.5 - 8.1 g/dL - - -  Total Bilirubin 0.3 - 1.2 mg/dL - - -  Alkaline Phos 38 - 126 U/L - - -  AST 15 - 41 U/L - - -  ALT 17 - 63 U/L - - -    CBC Latest Ref Rng & Units 09/21/2016 09/20/2016 09/14/2016  WBC 4.0 - 10.5 K/uL 26.6(H) 20.8(H) 18.0(H)  Hemoglobin 13.0 - 17.0 g/dL 8.0(L) 7.7(L) 7.7(L)  Hematocrit 39.0 - 52.0 % 24.9(L) 24.1(L) 23.6(L)  Platelets 150 - 400 K/uL 476(H) 505(H) 502(H)    Imaging: Dg Chest Port 1 View  Result Date: 09/20/2016 CLINICAL DATA:  Respiratory failure. EXAM: PORTABLE CHEST 1 VIEW COMPARISON:  Radiograph of September 19, 2016. FINDINGS: Stable cardiomediastinal silhouette. Tracheostomy tube and right internal jugular catheter are unchanged in position. No pneumothorax is noted. Stable minimal left perihilar and basilar subsegmental atelectasis is noted. Stable right perihilar and basilar opacity is noted concerning for pneumonia or edema. Bony thorax is unremarkable. IMPRESSION: Stable support apparatus. Stable right right lung opacity as described above. Electronically  Signed   By: Marijo Conception, M.D.   On: 09/20/2016 07:52    ASSESSMENT / PLAN:  Status epilepticus >> possibly related to Abx vs meningitis  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 - AEDs per neurology - neurology arrange for cervical puncture for CSF sampling, 12/15 scheduled per IR - continue versed gtt  - IR to do cervical spinal tap 12/15  Acute on chronic respiratory failure. Tracheostomy status. -  full vent support -->wean when MS allows  - defer trach change for now  - f/u CXR PRN  HCAP, recurrent UTI, osteomyelitis, sacral/heel wounds. - monitor off Abx - ID following - still has Pseudomonas and Stenotrophomonas in sputum from 12/08 >> ?if this is chronic colonization. Will defer to ID  Non-anion gap Metabolic acidosis in setting of hyperchloremia  - adjust LR -f/u cmp in am   Anemia of critical illness:  f/u CBC after transfusion 12/11 12/12 6.2->8.1 -trend CBC  Rt subclavian/brachial DVT 2nd to PICC line 11/27. 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 Chronic diastolic CHF. Hypotension 2nd to sedation. - pressors to keep MAP > 55 - midodrine - dc IJ - needs left PICC  - Will decide on systemic anticoagulation later   Severe protein calorie malnutrition. DM gastroparesis. - tube feeds resume when able   DM type 1. CBG (last 3)  - SSI -dc d10   Erick Colace ACNP-BC [place] Pager # [phone] OR # [phone] if no answer  09/21/2016, 10:56 AM  Remains on sedation, full vent support.  HR regular.  B/l crackles.  Abd soft.  Assessment/plan:  Altered mental status with status epilepticus. - for spinal tap with IR - versed gtt per neuro  Acute on chronic respiratory failure. - full vent support  Upper extremity DVT. - change line  Updated pt's family at bedside.  Chesley Mires, MD [facility] Pulmonary/Critical Care 09/21/2016, 2:09 PM Pager:  [phone] After 3pm call: [phone]

## 2016-09-21 NOTE — Progress Notes (Signed)
Peripherally Inserted Central Catheter/Midline Placement  The IV Nurse has discussed with the patient and/or persons authorized to consent for the patient, the purpose of this procedure and the potential benefits and risks involved with this procedure.  The benefits include less needle sticks, lab draws from the catheter, and the patient may be discharged home with the catheter. Risks include, but not limited to, infection, bleeding, blood clot (thrombus formation), and puncture of an artery; nerve damage and irregular heartbeat and possibility to perform a PICC exchange if needed/ordered by physician.  Alternatives to this procedure were also discussed.  Bard Power PICC patient education guide, fact sheet on infection prevention and patient information card has been provided to patient /or left at bedside.    PICC/Midline Placement Documentation        Jason [name] 09/21/2016, 2:28 PM Phone consent obtained from mother

## 2016-09-21 NOTE — Progress Notes (Addendum)
NEURO HOSPITALIST PROGRESS NOTE   SUBJECTIVE:                                                                     Neurologically unchanged. No clinical seizures noted. c-EEG monitoring d/c 12/14: no electrographic seizures, burst suppression pattern. AEDs: keppra 1,500 mg BID, vimpat 200 mg BID, topamax 100 mg BID, depakote 1250 mg Q 6 h  OBJECTIVE:                                                                        Vital signs in last 24 hours: Temp:  [97.8 F (36.6 C)-98.9 F (37.2 C)] 97.8 F (36.6 C) (12/15 0400) Pulse Rate:  [65-92] 68 (12/15 1330) Resp:  [17-24] 24 (12/15 1330) BP: (78-127)/(52-86) 83/60 (12/15 1330) SpO2:  [100 %] 100 % (12/15 1330) FiO2 (%):  [30 %] 30 % (12/15 1309) Weight:  [51.3 kg (113 lb)] 51.3 kg (113 lb) (12/15 0400)  Intake/Output from previous day: 12/14 0701 - 12/15 0700 In: 4813.3 [I.V.:2888.3; NG/GT:1105; IV Piggyback:550] Out: 3300 [Urine:2700; Stool:600] Intake/Output this shift: Total I/O In: 477.5 [I.V.:377.5; IV Piggyback:100] Out: 1000 [Urine:1000] Nutritional status: Diet NPO time specified Except for: Sips with Meds  Past Medical History:  Diagnosis Date  . Anemia   . Anxiety   . Arthritis   . Asthma   . Chronic osteomyelitis involving pelvic region and thigh ([place]) 06/18/2016  . Colostomy in place [facility])   . Complication of anesthesia    difficulty waking up, will have sudden drop in BP and O2 sats  . Depression   . Diabetic neuropathy ([place])   . Diabetic retinopathy ([place])   . Dysrhythmia    usually due to medications  . Family history of anesthesia complication    Pt mother can't have epidural procedures  . Fibromyalgia   . Gastroparesis   . GERD (gastroesophageal reflux disease)   . H/O constipation   . H/O respiratory failure   . H/O thrombosis   . Hx MRSA infection    on face  . Multiple allergies 11/02/2015  . Pneumonia   . Recurrent UTI 11/02/2015  . Seizures ([place]) 2017    one time during admission in hosptital  . 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 Syncope 02/16/2015  . Tracheostomy present ([place])   . Type I diabetes mellitus ([place])    sees Dr. Loanne Drilling     Neurologic ROS unable to obtain due to mental status  Neurologic Exam:  Neuro: Exam confounded by heavy sedation  MS: does not respond [id number] unreactive, no corneal response, face symmetric Motor: no spontaneous or pain induced movement  Sensory: no reaction to pain   Lab Results: Lab Results  Component Value Date/Time   CHOL  161 09/16/2016 02:20 AM   Lipid Panel  Recent Labs  09/22/2016 0400  TRIG 133    Studies/Results: Dg Chest Port 1 View  Result Date: 09/20/2016 CLINICAL DATA:  Respiratory failure. EXAM: PORTABLE CHEST 1 VIEW COMPARISON:  Radiograph of September 19, 2016. FINDINGS: Stable cardiomediastinal silhouette. Tracheostomy tube and right internal jugular catheter are unchanged in position. No pneumothorax is noted. Stable minimal left perihilar and basilar subsegmental atelectasis is noted. Stable right perihilar and basilar opacity is noted concerning for pneumonia or edema. Bony thorax is unremarkable. IMPRESSION: Stable support apparatus. Stable right right lung opacity as described above. Electronically Signed   By: Marijo Conception, M.D.   On: 09/20/2016 07:52    MEDICATIONS                                                                                                                       I have reviewed the patient's current medications.  ASSESSMENT/PLAN:                                                          30 y/o male with new onset super refratory generalized status epilepticus. Concern for CNS infection as the etiology of patient NORSE but had broad antibiotic coverage. Unable to perform LP due to sacral infection extending up into L1, 2 and family refused cysternal puncture. 3 mm acute infarct left paracentral pons. In the meantime,  continue current AEDs: keppra 1,500 mg BID, vimpat 200 mg BID, topamax 100 mg BID, and depakote 1250 mg Q 6 h. Follow up MRI brain tomorrow if feasible .  Dorian Pod, MD [facility] [phone]  09/21/2016, 4:00 PM   Addendum: patient still on 60 mg midazolam: will start weaning off. Family now agreed to performing cisternal puncture but no anesthesia available and IR rescheduled it for Mon 12/18  [facility] Emy Angevine,MD

## 2016-09-21 NOTE — Progress Notes (Signed)
INFECTIOUS DISEASE PROGRESS NOTE  ID: Jason [name] is a 30 y.o. male with  Active Problems:   Gastroparesis due to DM ([place])   Altered mental status   Neurogenic bladder   Suprapubic catheter ([place])   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 ([place])   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([place])   History of pulmonary embolism   DVT (deep venous thrombosis) ([name])   Severe protein-calorie malnutrition ([place])   Dysphagia, pharyngoesophageal phase   Decubitus ulcer of right perineal ischial region, stage 4 ([place])   Decubitus ulcer of left perineal ischial region, stage 4 ([place])   Sacral decubitus ulcer, stage IV ([name])   Multiple allergies   Recurrent UTI   Respiratory failure, chronic ([place])   Aspiration pneumonia of right lower lobe ([name])   Seizures ([name])   Anxiety state   Anemia of chronic disease   Hyponatremia   Encephalopathy   Other acute osteomyelitis, multiple sites ([place])   Lower GI bleed   Viridans streptococci infection   Coma ([place])   Septic shock ([name])   Anaphylactic syndrome   Acute hypoxemic respiratory failure ([place])   Enterococcal infection   Thrombocytopenia ([place])   Status epilepticus ([place])   Chronic respiratory failure with hypoxia ([place])   Acute respiratory acidosis  Subjective: No response  Abtx:  Anti-infectives    Start     Dose/Rate Route Frequency Ordered Stop   09/11/16 2200  ceFEPIme (MAXIPIME) 2 g in dextrose 5 % 50 mL IVPB  Status:  Discontinued     2 g 100 mL/hr over 30 Minutes Intravenous Every 8 hours 09/11/16 1830 09/11/16 1838   09/11/16 2200  ceFEPIme (MAXIPIME) 2 g in dextrose 5 % 50 mL IVPB  Status:  Discontinued     2 g 100 mL/hr over 30 Minutes Intravenous Every 8 hours 09/11/16 1838 09/15/16 1441   09/11/16 2000  ceFEPIme (MAXIPIME) 2 g in dextrose 5 % 50 mL IVPB  Status:  Discontinued     2 g 100 mL/hr over 30 Minutes Intravenous Every 8 hours 09/11/16 1813 09/11/16 1830   09/11/16 1400  meropenem (MERREM) 1 g in sodium  chloride 0.9 % 100 mL IVPB  Status:  Discontinued     1 g 200 mL/hr over 30 Minutes Intravenous Every 8 hours 09/11/16 1150 09/11/16 1813   09/11/16 1400  sulfamethoxazole-trimethoprim (BACTRIM) 240 mg of trimethoprim in dextrose 5 % 250 mL IVPB  Status:  Discontinued     240 mg of trimethoprim 265 mL/hr over 60 Minutes Intravenous Every 8 hours 09/11/16 1156 09/15/16 1441   09/10/16 2100  levofloxacin (LEVAQUIN) IVPB 750 mg  Status:  Discontinued     750 mg 100 mL/hr over 90 Minutes Intravenous Every 24 hours 09/10/16 1849 09/11/16 1115   09/10/16 2000  ceFEPIme (MAXIPIME) 2 g in dextrose 5 % 50 mL IVPB  Status:  Discontinued     2 g 100 mL/hr over 30 Minutes Intravenous Every 8 hours 09/10/16 1847 09/11/16 1140   09/08/16 1700  imipenem-cilastatin (PRIMAXIN) 1,000 mg in sodium chloride 0.9 % 250 mL IVPB  Status:  Discontinued     1,000 mg 250 mL/hr over 60 Minutes Intravenous Every 8 hours 09/08/16 1159 09/10/16 1736   09/01/16 1300  anidulafungin (ERAXIS) 100 mg in sodium chloride 0.9 % 100 mL IVPB  Status:  Discontinued     100 mg over 90 Minutes Intravenous Every 24 hours 08/31/16 1240 09/02/16 1252   09/01/16 1200  vancomycin (VANCOCIN)  IVPB 750 mg/150 ml premix  Status:  Discontinued     750 mg 150 mL/hr over 60 Minutes Intravenous Every 24 hours 09/01/16 1145 09/01/16 1712   08/31/16 1700  imipenem-cilastatin (PRIMAXIN) 500 mg in sodium chloride 0.9 % 100 mL IVPB  Status:  Discontinued     500 mg 200 mL/hr over 30 Minutes Intravenous Every 8 hours 08/31/16 1240 09/08/16 1159   08/31/16 1300  anidulafungin (ERAXIS) 200 mg in sodium chloride 0.9 % 200 mL IVPB     200 mg over 180 Minutes Intravenous  Once 08/31/16 1240 08/31/16 1810   08/11/2016 0557  tigecycline (TYGACIL) 50 mg in sodium chloride 0.9 % 100 mL IVPB  Status:  Discontinued     50 mg 200 mL/hr over 30 Minutes Intravenous Every 12 hours 08/28/16 1758 08/31/16 1151   08/28/16 1800  tigecycline (TYGACIL) 100 mg in sodium  chloride 0.9 % 100 mL IVPB  Status:  Discontinued     100 mg 200 mL/hr over 30 Minutes Intravenous  Once 08/28/16 1758 08/31/16 1424   08/28/16 1300  linezolid (ZYVOX) IVPB 600 mg  Status:  Discontinued     600 mg 300 mL/hr over 60 Minutes Intravenous Every 12 hours 08/28/16 1239 08/31/16 2014   08/28/16 0200  aztreonam (AZACTAM) 1 g in dextrose 5 % 50 mL IVPB  Status:  Discontinued     1 g 100 mL/hr over 30 Minutes Intravenous Every 8 hours 08/27/16 1802 08/28/16 1232   08/27/16 2000  vancomycin (VANCOCIN) 500 mg in sodium chloride 0.9 % 100 mL IVPB  Status:  Discontinued     500 mg 100 mL/hr over 60 Minutes Intravenous Every 12 hours 08/27/16 1802 08/28/16 1239   08/27/16 1800  aztreonam (AZACTAM) 2 g in dextrose 5 % 50 mL IVPB     2 g 100 mL/hr over 30 Minutes Intravenous  Once 08/27/16 1634 08/27/16 1852   08/27/16 1600  levofloxacin (LEVAQUIN) IVPB 750 mg  Status:  Discontinued     750 mg 100 mL/hr over 90 Minutes Intravenous Every 24 hours 09/02/2016 1324 08/24/2016 1522   08/27/16 0000  vancomycin (VANCOCIN) 500 mg in sodium chloride 0.9 % 100 mL IVPB  Status:  Discontinued     500 mg 100 mL/hr over 60 Minutes Intravenous Every 12 hours 09/05/2016 1324 08/16/2016 1522   08/24/2016 2200  aztreonam (AZACTAM) 1 g in dextrose 5 % 50 mL IVPB  Status:  Discontinued     1 g 100 mL/hr over 30 Minutes Intravenous Every 8 hours 08/16/2016 1324 08/27/2016 1522   08/18/2016 1600  linezolid (ZYVOX) tablet 600 mg  Status:  Discontinued     600 mg Oral Every 12 hours 08/18/2016 1458 08/27/16 1347   08/12/2016 1300  levofloxacin (LEVAQUIN) IVPB 750 mg  Status:  Discontinued     750 mg 100 mL/hr over 90 Minutes Intravenous  Once 09/01/2016 1246 08/28/2016 1522   08/10/2016 1300  aztreonam (AZACTAM) 2 g in dextrose 5 % 50 mL IVPB  Status:  Discontinued     2 g 100 mL/hr over 30 Minutes Intravenous  Once 08/25/2016 1246 08/27/2016 1522   08/13/2016 1300  vancomycin (VANCOCIN) IVPB 1000 mg/200 mL premix  Status:  Discontinued       1,000 mg 200 mL/hr over 60 Minutes Intravenous  Once 08/31/2016 1246 08/12/2016 1522      Medications:  Scheduled: . artificial tears   Both Eyes Q8H  . baclofen  10 mg Oral QID  . budesonide (  PULMICORT) nebulizer solution  0.5 mg Nebulization BID  . chlorhexidine gluconate (MEDLINE KIT)  15 mL Mouth Rinse BID  . insulin aspart  1-3 Units Subcutaneous Q4H  . insulin glargine  5 Units Subcutaneous QHS  . ipratropium  0.5 mg Nebulization BID  . lacosamide (VIMPAT) IV  100 mg Intravenous Q12H  . levETIRAcetam  1,500 mg Intravenous Q12H  . mouth rinse  15 mL Mouth Rinse 10 times per day  . midazolam  40 mg Intravenous Once  . midodrine  20 mg Per Tube TID WC  . oxybutynin  5 mg Per Tube BID  . pantoprazole sodium  40 mg Per Tube Daily  . scopolamine  1 patch Transdermal Q72H  . topiramate  100 mg Oral BID  . valproate sodium  1,250 mg Intravenous Q6H    Objective: Vital signs in last 24 hours: Temp:  [97.8 F (36.6 C)-98.9 F (37.2 C)] 97.8 F (36.6 C) (12/15 0400) Pulse Rate:  [71-92] 71 (12/15 0800) Resp:  [13-21] 18 (12/15 0800) BP: (82-127)/(48-80) 95/67 (12/15 0800) SpO2:  [100 %] 100 % (12/15 0800) FiO2 (%):  [30 %] 30 % (12/15 0326) Weight:  [51.3 kg (113 lb)] 51.3 kg (113 lb) (12/15 0400)   General appearance: no distress Resp: clear to auscultation bilaterally Cardio: regular rate and rhythm GI: abnormal findings:  hypoactive bowel sounds  Lab Results  Recent Labs  09/20/16 0500 09/21/16 0455  WBC 20.8* 26.6*  HGB 7.7* 8.0*  HCT 24.1* 24.9*  NA 140 141  K 3.0* 3.8  CL 120* 121*  CO2 13* 13*  BUN 19 18  CREATININE 0.50* 0.49*   Liver Panel  Recent Labs  10/04/2016 0400  ALBUMIN 1.2*   Sedimentation Rate No results for input(s): ESRSEDRATE in the last 72 hours. C-Reactive Protein No results for input(s): CRP in the last 72 hours.  Microbiology: Recent Results (from the past 240 hour(s))  Culture, respiratory (NON-Expectorated)     Status:  None   Collection Time: 09/14/16  1:19 PM  Result Value Ref Range Status   Specimen Description TRACHEAL ASPIRATE  Final   Special Requests NONE  Final   Gram Stain   Final    ABUNDANT WBC PRESENT, PREDOMINANTLY PMN RARE GRAM NEGATIVE RODS MODERATE YEAST    Culture   Final    ABUNDANT PSEUDOMONAS AERUGINOSA MODERATE STENOTROPHOMONAS MALTOPHILIA PSEUDOMONAS AERUGINOSA MULTI-DRUG RESISTANT ORGANISM Results Called toKarlene Einstein PHARMD, AT [place] 10/04/2016 BY D. VANHOOK    Report Status 09/20/2016 FINAL  Final   Organism ID, Bacteria STENOTROPHOMONAS MALTOPHILIA  Final   Organism ID, Bacteria PSEUDOMONAS AERUGINOSA  Final      Susceptibility   Pseudomonas aeruginosa - MIC*    CEFTAZIDIME 8 SENSITIVE Sensitive     CIPROFLOXACIN >=4 RESISTANT Resistant     GENTAMICIN >=16 RESISTANT Resistant     IMIPENEM >=16 RESISTANT Resistant     CEFEPIME 16 INTERMEDIATE Intermediate     * ABUNDANT PSEUDOMONAS AERUGINOSA   Stenotrophomonas maltophilia - MIC*    LEVOFLOXACIN 1 SENSITIVE Sensitive     TRIMETH/SULFA <=20 SENSITIVE Sensitive     * MODERATE STENOTROPHOMONAS MALTOPHILIA    Studies/Results: Dg Chest Port 1 View  Result Date: 09/20/2016 CLINICAL DATA:  Respiratory failure. EXAM: PORTABLE CHEST 1 VIEW COMPARISON:  Radiograph of September 19, 2016. FINDINGS: Stable cardiomediastinal silhouette. Tracheostomy tube and right internal jugular catheter are unchanged in position. No pneumothorax is noted. Stable minimal left perihilar and basilar subsegmental atelectasis is noted. Stable right perihilar  and basilar opacity is noted concerning for pneumonia or edema. Bony thorax is unremarkable. IMPRESSION: Stable support apparatus. Stable right right lung opacity as described above. Electronically Signed   By: Marijo Conception, M.D.   On: 09/20/2016 07:52     Assessment/Plan: C6 Spinal Infarct Quadraplegia Sepsis Streptococcal constellatus Sacral Osteomyelitis Bone Bx  11-22 ESBL klebsiella Enterococcus (S- amp) Strep constellatus HCAP, aspiration Chronic Trach CXR worsening vascular congestion.   11-23Acinetobacter, Klebsiella 11-30 stenotrophomonas, pseudomonas Status epiliepticus on keppra, vimpat, topamax, depakote, and versed.  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  Will watch as he wakes up  Total days of antibiotics: 18 --> off since 12-9 12-4 cefepime, bactrim >> 12-9 Imipenem 12-2 >>12-4 Tygacil 11/21 - 11/24 Vancomycin 11/19 - 11/25 Eraxis 11/24 - 11/26 Aztreonam 11/19 - 11/21 Levaquin 11/19 - 11/20 Zyvox 11/19 - 11/24         [name] Infectious Diseases (pager) [phone] [email or web address] 09/21/2016, 8:44 AM  LOS: 26 days

## 2016-09-21 NOTE — Progress Notes (Signed)
Patient ID: Jason [name], male   DOB: June 16, 1986, 30 y.o.   MRN: [id number]   Pt was scheduled for cervical puncture procedure in IR today.  Unfortunately, we do not have anesthesia available.  Must reschedule to [facility] 12/18   Cont to hold ASA  Plan for 12/18.  Any questions or concerns please call Dr Estanislado Pandy [phone] Or [phone]

## 2016-09-22 ENCOUNTER — Inpatient Hospital Stay (HOSPITAL_COMMUNITY): Payer: Medicaid Other

## 2016-09-22 LAB — GLUCOSE, CAPILLARY
GLUCOSE-CAPILLARY: 116 mg/dL — AB (ref 65–99)
Glucose-Capillary: 113 mg/dL — ABNORMAL HIGH (ref 65–99)
Glucose-Capillary: 152 mg/dL — ABNORMAL HIGH (ref 65–99)
Glucose-Capillary: 181 mg/dL — ABNORMAL HIGH (ref 65–99)
Glucose-Capillary: 35 mg/dL — CL (ref 65–99)
Glucose-Capillary: 78 mg/dL (ref 65–99)
Glucose-Capillary: 98 mg/dL (ref 65–99)

## 2016-09-22 MED ORDER — DEXTROSE 50 % IV SOLN
INTRAVENOUS | Status: AC
  Administered 2016-09-22: 50 mL
  Filled 2016-09-22: qty 50

## 2016-09-22 NOTE — Progress Notes (Signed)
Name: Jason [name] MRN: [id number] DOB: [birth year]    ADMISSION DATE:  08/25/2016 CONSULTATION DATE:  11/21  REFERRING MD :  [email or web address] ([facility])   CHIEF COMPLAINT:  Lethargy, vent mgmt   BRIEF PATIENT DESCRIPTION:   30 yo male developed altered mental status after receiving azactam in setting of chronic wound infections (sacrum/heels).  He has hx of C6 spine injury with quadriplegia, tracheostomy status.  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, chronic Rt cerebellar infarct, atrophy  MICROBIOLOGY:  Blood Ctx x3 11/19:  3/3 Streptococcus constellatus  Urine Ctx 11/19:  Acinetobacter & Enterococcus faecalis MRSA PCR 11/20:  Positive Sacral Bone Ctx 11/22:  ESBL Kleb pna, Enterococcus faecalis, strep constellatus Blood Ctx x2 11/23:  Negative  Tracheal Asp Ctx 11/23:  Klebsiella pneumoniae & Acinetobacter Tracheal Asp Ctx 11/24:  Klebsiella pneumoniae & Acinetobacter Tracheal Asp Ctx 11/30:  Pseudomonas aeruginosa & Stenotrophomonas   SUBJECTIVE:   Remains on high dose versed.  Plans for IR cisternal puncture Monday.   VITAL SIGNS: BP 98/71   Pulse 82   Temp (!) 96.6 F (35.9 C) (Axillary)   Resp 20   Ht 5\' 8"  (1.727 m)   Wt 50.3 kg (111 lb)   SpO2 100%   BMI 16.88 kg/m   INTAKE/OUTPUT: I/O last 3 completed shifts: In: 7301.2 [I.V.:4981.2; Other:80; NG/GT:1430; IV Piggyback:810] Out: [id number]; Stool:300]  PHYSICAL EXAMINATION: General: chronically ill appearing young male,  Heavily Sedated on vent, NAD  HEENT: Tracheostomy clean, no drainage  Cardiovascular:  s1 s2 Regular rhythm Pulmonary: resps even non labored on vent, few scattered rhonchi  Abdomen: Soft. Normal bowel sounds. Nondistended. , colostomy , TF via  peg Neurological: perr, moves etx upper, head, not fc EXt R>L edema   CMP Latest Ref Rng & Units 09/21/2016 09/20/2016 09/11/2016  Glucose 65 - 99 mg/dL 266(H) 180(H) 62(L)  BUN 6 - 20 mg/dL 18 19 23(H)  Creatinine 0.61 - 1.24 mg/dL 0.49(L) 0.50(L) 0.44(L)  Sodium 135 - 145 mmol/L 141 140 143  Potassium 3.5 - 5.1 mmol/L 3.8 3.0(L) 3.6  Chloride 101 - 111 mmol/L 121(H) 120(H) 124(H)  CO2 22 - 32 mmol/L 13(L) 13(L) 14(L)  Calcium 8.9 - 10.3 mg/dL 8.2(L) 7.6(L) 7.7(L)  Total Protein 6.5 - 8.1 g/dL - - -  Total Bilirubin 0.3 - 1.2 mg/dL - - -  Alkaline Phos 38 - 126 U/L - - -  AST 15 - 41 U/L - - -  ALT 17 - 63 U/L - - -    CBC Latest Ref Rng & Units 09/21/2016 09/20/2016 09/12/2016  WBC 4.0 - 10.5 K/uL 26.6(H) 20.8(H) 18.0(H)  Hemoglobin 13.0 - 17.0 g/dL 8.0(L) 7.7(L) 7.7(L)  Hematocrit 39.0 - 52.0 % 24.9(L) 24.1(L) 23.6(L)  Platelets 150 - 400 K/uL 476(H) 505(H) 502(H)    Imaging: No results found.  ASSESSMENT / PLAN:  Status epilepticus >> possibly related to Abx vs meningitis  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 - AEDs per neurology - neurology arrange for cervical puncture for CSF sampling, plan for 12/18 per IR  - continue versed gtt -- neuro notes 12/15 say wean versed but order not changed -- will wean to 50mg /hr for now and d/w neuro - IR to do cervical spinal tap  12/18  Acute on chronic respiratory failure. Tracheostomy status. - full vent support -->wean when MS allows  - defer trach change for now  - f/u CXR PRN  HCAP, recurrent UTI, osteomyelitis, sacral/heel wounds. - monitor off Abx - ID following - still has Pseudomonas and Stenotrophomonas in sputum from 12/08 >> ?if this is chronic colonization. Will defer to ID  Non-anion gap Metabolic acidosis in setting of hyperchloremia  - adjust LR -f/u cmp in am   Anemia of critical illness:  f/u CBC after transfusion 12/11 12/12 6.2->8.1 -trend CBC  Rt subclavian/brachial DVT  2nd to PICC line 11/27. 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 Chronic diastolic CHF. Hypotension 2nd to sedation. - pressors to keep MAP > 55 - midodrine - IJ changed to PICC 12/15 - Will decide on systemic anticoagulation later   Severe protein calorie malnutrition. DM gastroparesis. - tube feeds resume when able   DM type 1. CBG (last 3)  - SSI -dc d10    Nickolas Madrid, NP 09/22/2016  8:41 AM Pager: [phone] or [phone]  Remains on sedation.    Trach site clean.  Oozing from G tube.  No wheeze.  Assessment/plan:  Seizure. - wean versed per neuro  Respiratory failure. - full vent support.  Chesley Mires, MD [facility] Pulmonary/Critical Care 09/22/2016, 12:14 PM Pager:  [phone] After 3pm call: [phone]

## 2016-09-22 NOTE — Progress Notes (Signed)
NEURO HOSPITALIST PROGRESS NOTE   SUBJECTIVE:                                                                                                                        No new neurological developments. Weaning off midazolam drip, currently at 50 mg/hr c-EEG monitoring d/c 12/14: no electrographic seizures, burst suppression pattern. AEDs: keppra 1,500 mg BID, vimpat 200 mg BID, topamax 100 mg BID, depakote 1250 mg Q 6 h  OBJECTIVE:                                                                                                                           Vital signs in last 24 hours: Temp:  [94.9 F (34.9 C)-102.3 F (39.1 C)] 102.3 F (39.1 C) (12/16 0913) Pulse Rate:  [58-103] 103 (12/16 0913) Resp:  [14-24] 23 (12/16 0913) BP: (78-124)/(44-82) 88/56 (12/16 0913) SpO2:  [100 %] 100 % (12/16 0913) FiO2 (%):  [30 %] 30 % (12/16 0913) Weight:  [50.3 kg (111 lb)] 50.3 kg (111 lb) (12/16 0300)  Intake/Output from previous day: 12/15 0701 - 12/16 0700 In: 4961.7 [I.V.:3301.7; NG/GT:1105; IV Piggyback:535] Out: 2375 [Urine:2075; Stool:300] Intake/Output this shift: No intake/output data recorded. Nutritional status: Diet NPO time specified Except for: Sips with Meds  Past Medical History:  Diagnosis Date  . Anemia   . Anxiety   . Arthritis   . Asthma   . 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 Colostomy in place [facility])   . Complication of anesthesia    difficulty waking up, will have sudden drop in BP and O2 sats  . Depression   . Diabetic neuropathy ([place])   . Diabetic retinopathy ([place])   . Dysrhythmia    usually due to medications  . Family history of anesthesia complication    Pt mother can't have epidural procedures  . Fibromyalgia   . Gastroparesis   . GERD (gastroesophageal reflux disease)   . H/O constipation   . H/O respiratory failure   . H/O thrombosis   . Hx MRSA infection    on face  .  Multiple allergies 11/02/2015  . Pneumonia   . Recurrent UTI 11/02/2015  . Seizures ([place]) 2017  one time during admission in hosptital  . 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 Syncope 02/16/2015  . Tracheostomy present ([place])   . Type I diabetes mellitus ([place])    sees Dr. Loanne Drilling     Neurologic ROS unable to obtain due to mental status  Neurologic Exam:  Heavily sedated, will defer at this time  Lab Results: Lab Results  Component Value Date/Time   CHOL 161 09/16/2016 02:20 AM   Lipid Panel No results for input(s): CHOL, TRIG, HDL, CHOLHDL, VLDL, LDLCALC in the last 72 hours.  Studies/Results: No results found.  MEDICATIONS                                                                                                                       I have reviewed the patient's current medications.  ASSESSMENT/PLAN:                                                                                                            30 y/o malewith new onset super refratory generalized status epilepticus. Concern for CNS infection as the etiology of patient NORSE but had broad antibiotic coverage. Unable to perform LP due tosacral infectionextending up into L1, 2.  Family now agreeable to go ahead with cysternal puncture by IR, scheduled for tomorrow. 3 mm acute infarct left paracentral pons. In the meantime, continue current AEDs: keppra 1,500 mg BID, vimpat 200 mg BID, topamax 100 mg BID, and depakote 1250 mg Q 6 h. Continue weaning off midazolam 30 mg/hr.  Repeat MRI brain.   Dorian Pod, MD [facility] [phone]  09/22/2016, 10:20 AM

## 2016-09-22 NOTE — Progress Notes (Signed)
Pt rectal temp of 94.9 degrees Fahrenheit, pt had Bair Hugger applied since approximately 2000 on 12/15. Notified eLink MD, no new orders received at this time. Will continue to monitor closely.  Sherlie Ban, RN

## 2016-09-23 LAB — GLUCOSE, CAPILLARY
GLUCOSE-CAPILLARY: 136 mg/dL — AB (ref 65–99)
GLUCOSE-CAPILLARY: 97 mg/dL (ref 65–99)
Glucose-Capillary: 94 mg/dL (ref 65–99)
Glucose-Capillary: 80 mg/dL (ref 65–99)
Glucose-Capillary: 140 mg/dL — ABNORMAL HIGH (ref 65–99)

## 2016-09-23 LAB — CBC
HCT: 21.9 % — ABNORMAL LOW (ref 39.0–52.0)
HEMOGLOBIN: 7 g/dL — AB (ref 13.0–17.0)
MCH: 27.8 pg (ref 26.0–34.0)
MCHC: 32 g/dL (ref 30.0–36.0)
MCV: 86.9 fL (ref 78.0–100.0)
PLATELETS: 351 10*3/uL (ref 150–400)
RBC: 2.52 MIL/uL — ABNORMAL LOW (ref 4.22–5.81)
RDW: 19.9 % — AB (ref 11.5–15.5)
WBC: 27.4 10*3/uL — ABNORMAL HIGH (ref 4.0–10.5)

## 2016-09-23 LAB — BASIC METABOLIC PANEL
Anion gap: 8 (ref 5–15)
BUN: 15 mg/dL (ref 6–20)
CALCIUM: 8.4 mg/dL — AB (ref 8.9–10.3)
CHLORIDE: 124 mmol/L — AB (ref 101–111)
CO2: 14 mmol/L — ABNORMAL LOW (ref 22–32)
CREATININE: 0.45 mg/dL — AB (ref 0.61–1.24)
Glucose, Bld: 93 mg/dL (ref 65–99)
Potassium: 3.2 mmol/L — ABNORMAL LOW (ref 3.5–5.1)
SODIUM: 146 mmol/L — AB (ref 135–145)

## 2016-09-23 LAB — MAGNESIUM: MAGNESIUM: 1.5 mg/dL — AB (ref 1.7–2.4)

## 2016-09-23 LAB — TRIGLYCERIDES: Triglycerides: 98 mg/dL (ref <150)

## 2016-09-23 LAB — PHOSPHORUS: PHOSPHORUS: 3.3 mg/dL (ref 2.5–4.6)

## 2016-09-23 MED ORDER — SODIUM CHLORIDE 0.9 % IV SOLN
6.0000 g | Freq: Once | INTRAVENOUS | Status: AC
  Administered 2016-09-23: 6 g via INTRAVENOUS
  Filled 2016-09-23: qty 12

## 2016-09-23 MED ORDER — POTASSIUM CHLORIDE 20 MEQ/15ML (10%) PO SOLN
40.0000 meq | Freq: Once | ORAL | Status: AC
  Administered 2016-09-23: 40 meq
  Filled 2016-09-23: qty 30

## 2016-09-23 NOTE — Progress Notes (Signed)
Name: Jason [name] MRN: [id number] DOB: 07-12-1986    ADMISSION DATE:  08/09/2016 CONSULTATION DATE:  11/21  REFERRING MD :  [email or web address] ([facility])   CHIEF COMPLAINT:  Lethargy, vent mgmt   BRIEF PATIENT DESCRIPTION:   30 yo male developed altered mental status after receiving azactam in setting of chronic wound infections (sacrum/heels).  He has hx of C6 spine injury with quadriplegia, tracheostomy status.  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, chronic Rt cerebellar infarct, atrophy  MICROBIOLOGY:  Blood Ctx x3 11/19:  3/3 Streptococcus constellatus  Urine Ctx 11/19:  Acinetobacter & Enterococcus faecalis MRSA PCR 11/20:  Positive Sacral Bone Ctx 11/22:  ESBL Kleb pna, Enterococcus faecalis, strep constellatus Blood Ctx x2 11/23:  Negative  Tracheal Asp Ctx 11/23:  Klebsiella pneumoniae & Acinetobacter Tracheal Asp Ctx 11/24:  Klebsiella pneumoniae & Acinetobacter Tracheal Asp Ctx 11/30:  Pseudomonas aeruginosa & Stenotrophomonas   SUBJECTIVE:   No seizures overnight , Versed drip at 30mg /hr  Temp down intermittently , requiring bair hugger Plans for IR cisternal puncture Monday.  Decreased peg tube drainage, clamped, TF on hodl   VITAL SIGNS: BP (!) 101/46   Pulse 77   Temp 99.3 F (37.4 C) (Oral)   Resp 18   Ht 5\' 8"  (1.727 m)   Wt 48.1 kg (106 lb)   SpO2 100%   BMI 16.12 kg/m   INTAKE/OUTPUT: I/O last 3 completed shifts: In: 6363.5 [I.V.:4613.5; Other:80; NG/GT:845; IV Piggyback:825] Out: 74 [Urine:2600; Drains:120; Stool:550]  PHYSICAL EXAMINATION: General: chronically ill appearing young male,  Heavily Sedated on vent  HEENT: Tracheostomy clean, no drainage  Cardiovascular:  s1 s2 Regular rhythm Pulmonary: resps  even non labored on vent, few scattered rhonchi  Abdomen: Soft. Normal bowel sounds. Nondistended. , colostomy , Peg clamped, TF on hold  Neurological: perr, moves etx upper, head, not fc EXt R>L edema   CMP Latest Ref Rng & Units 09/23/2016 09/21/2016 09/20/2016  Glucose 65 - 99 mg/dL 93 266(H) 180(H)  BUN 6 - 20 mg/dL 15 18 19   Creatinine 0.61 - 1.24 mg/dL 0.45(L) 0.49(L) 0.50(L)  Sodium 135 - 145 mmol/L 146(H) 141 140  Potassium 3.5 - 5.1 mmol/L 3.2(L) 3.8 3.0(L)  Chloride 101 - 111 mmol/L 124(H) 121(H) 120(H)  CO2 22 - 32 mmol/L 14(L) 13(L) 13(L)  Calcium 8.9 - 10.3 mg/dL 8.4(L) 8.2(L) 7.6(L)  Total Protein 6.5 - 8.1 g/dL - - -  Total Bilirubin 0.3 - 1.2 mg/dL - - -  Alkaline Phos 38 - 126 U/L - - -  AST 15 - 41 U/L - - -  ALT 17 - 63 U/L - - -    CBC Latest Ref Rng & Units 09/23/2016 09/21/2016 09/20/2016  WBC 4.0 - 10.5 K/uL 27.4(H) 26.6(H) 20.8(H)  Hemoglobin 13.0 - 17.0 g/dL 7.0(L) 8.0(L) 7.7(L)  Hematocrit 39.0 - 52.0 % 21.9(L) 24.9(L) 24.1(L)  Platelets 150 - 400 K/uL 351 476(H) 505(H)    Imaging: Dg Abd Portable 1v  Result Date: 09/22/2016 CLINICAL DATA:  Leaking PEG tube. EXAM: PORTABLE ABDOMEN - 1 VIEW COMPARISON:  Abdomen and pelvis CT dated 09/11/2016. FINDINGS: Normal bowel gas pattern. Inferior vena cava filter tip at the L2 level. The PEG tube tip remains projected over the medial aspect of the left upper  abdomen. Multiple vertebral compression deformities are again demonstrated. Right iliac bone sclerosis is again demonstrated. Airspace opacity at the medial right lung base. IMPRESSION: 1. The PEG tube appears in satisfactory position in the frontal projection. 2. Right basilar pneumonia, aspiration pneumonitis or patchy atelectasis. 3. Chronic bone changes. Electronically Signed   By: Claudie Revering M.D.   On: 09/22/2016 13:12    ASSESSMENT / PLAN:  Status epilepticus >> possibly related to Abx vs meningitis  Pons infarct >> ?significance. C6 spine injury with  quadriplegia. - AEDs per neurology - neurology arrange for cervical puncture for CSF sampling, plan for 12/18 per IR  - continue versed gtt -- down to 30mg /hr  - IR to do cervical spinal tap 12/18  Acute on chronic respiratory failure. Tracheostomy status. - full vent support -->wean when MS allows  - defer trach change for now  - f/u CXR PRN  HCAP, recurrent UTI, osteomyelitis, sacral/heel wounds. - monitor off Abx - ID following - still has Pseudomonas and Stenotrophomonas in sputum from 12/08 >> ?if this is chronic colonization. Will defer to ID  Non-anion gap Metabolic acidosis in setting of hyperchloremia  - cont LR -f/u cmp in am  -Mag replaced  - replace K+   Anemia of critical illness:  f/u CBC after transfusion 12/11 12/12 6.2->8.1 -trend CBC  Rt subclavian/brachial DVT 2nd to PICC line 11/27. 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 Chronic diastolic CHF. Hypotension 2nd to sedation. - pressors to keep MAP > 55 - midodrine - IJ changed to PICC 12/15 - Will decide on systemic anticoagulation later   Severe protein calorie malnutrition. DM gastroparesis. - tube feeds resume when able -look at restart after procedure on 12/18   DM type 1. CBG (last 3)  - SSI - cont d5LR    Tammy Parrett NP-C  [facility]  [phone]  Remains sedated.  Not tolerating tube feeds.  RASS -4.  No wheeze.  Abd soft.  HR regular.  Assessment/plan:  Status epilepticus. - versed gtt per neuro  Respiratory failure. - full vent support  Gastric residuals. - continue to hold tube feeds  Chesley Mires, MD [place] 09/23/2016, 3:19 PM Pager:  [phone] After 3pm call: [phone]

## 2016-09-23 NOTE — Progress Notes (Signed)
[facility] ADULT ICU REPLACEMENT PROTOCOL FOR AM LAB REPLACEMENT ONLY  The patient does apply for the [facility] Adult ICU Electrolyte Replacment Protocol based on the criteria listed below:   1. Is GFR >/= 40 ml/min? Yes.    Patient's GFR today is >60 2. Is urine output >/= 0.5 ml/kg/hr for the last 6 hours? Yes.   Patient's UOP is 0.69 ml/kg/hr 3. Is BUN < 60 mg/dL? Yes.    Patient's BUN today is 15 4. Abnormal electrolyte  K 3.2 5. Ordered repletion with: per protocol 6. If a panic level lab has been reported, has the CCM MD in charge been notified? Yes.  .   Physician:  Illene Labrador, [place] 09/23/2016 5:40 AM

## 2016-09-24 ENCOUNTER — Inpatient Hospital Stay (HOSPITAL_COMMUNITY): Payer: Medicaid Other | Admitting: Anesthesiology

## 2016-09-24 ENCOUNTER — Encounter (HOSPITAL_COMMUNITY): Admission: EM | Disposition: E | Payer: Self-pay | Source: Home / Self Care | Attending: Pulmonary Disease

## 2016-09-24 ENCOUNTER — Inpatient Hospital Stay (HOSPITAL_COMMUNITY): Payer: Medicaid Other

## 2016-09-24 HISTORY — PX: RADIOLOGY WITH ANESTHESIA: SHX6223

## 2016-09-24 HISTORY — PX: IR GENERIC HISTORICAL: IMG1180011

## 2016-09-24 LAB — CBC
HCT: 22.4 % — ABNORMAL LOW (ref 39.0–52.0)
HEMATOCRIT: 18 % — AB (ref 39.0–52.0)
Hemoglobin: 5.9 g/dL — CL (ref 13.0–17.0)
Hemoglobin: 7.4 g/dL — ABNORMAL LOW (ref 13.0–17.0)
MCH: 28.8 pg (ref 26.0–34.0)
MCH: 30 pg (ref 26.0–34.0)
MCHC: 32.8 g/dL (ref 30.0–36.0)
MCHC: 33 g/dL (ref 30.0–36.0)
MCV: 87.8 fL (ref 78.0–100.0)
MCV: 90.7 fL (ref 78.0–100.0)
PLATELETS: 233 10*3/uL (ref 150–400)
PLATELETS: 219 10*3/uL (ref 150–400)
RBC: 2.05 MIL/uL — ABNORMAL LOW (ref 4.22–5.81)
RBC: 2.47 MIL/uL — AB (ref 4.22–5.81)
RDW: 20.3 % — AB (ref 11.5–15.5)
RDW: 20.1 % — AB (ref 11.5–15.5)
WBC: 17.2 10*3/uL — ABNORMAL HIGH (ref 4.0–10.5)
WBC: 16.8 10*3/uL — ABNORMAL HIGH (ref 4.0–10.5)

## 2016-09-24 LAB — GLUCOSE, CAPILLARY
GLUCOSE-CAPILLARY: 298 mg/dL — AB (ref 65–99)
GLUCOSE-CAPILLARY: 216 mg/dL — AB (ref 65–99)
GLUCOSE-CAPILLARY: 184 mg/dL — AB (ref 65–99)
Glucose-Capillary: 117 mg/dL — ABNORMAL HIGH (ref 65–99)
Glucose-Capillary: 123 mg/dL — ABNORMAL HIGH (ref 65–99)
Glucose-Capillary: 127 mg/dL — ABNORMAL HIGH (ref 65–99)
Glucose-Capillary: 134 mg/dL — ABNORMAL HIGH (ref 65–99)
Glucose-Capillary: 158 mg/dL — ABNORMAL HIGH (ref 65–99)
Glucose-Capillary: 161 mg/dL — ABNORMAL HIGH (ref 65–99)
Glucose-Capillary: 331 mg/dL — ABNORMAL HIGH (ref 65–99)
Glucose-Capillary: 344 mg/dL — ABNORMAL HIGH (ref 65–99)
Glucose-Capillary: 76 mg/dL (ref 65–99)

## 2016-09-24 LAB — BASIC METABOLIC PANEL
Anion gap: 5 (ref 5–15)
BUN: 13 mg/dL (ref 6–20)
CALCIUM: 8.2 mg/dL — AB (ref 8.9–10.3)
CO2: 15 mmol/L — AB (ref 22–32)
CREATININE: 0.5 mg/dL — AB (ref 0.61–1.24)
Chloride: 124 mmol/L — ABNORMAL HIGH (ref 101–111)
GFR calc Af Amer: >60 mL/min (ref >60)
GFR calc non Af Amer: >60 mL/min (ref >60)
Glucose, Bld: 325 mg/dL — ABNORMAL HIGH (ref 65–99)
Potassium: 3.4 mmol/L — ABNORMAL LOW (ref 3.5–5.1)
Sodium: 144 mmol/L (ref 135–145)

## 2016-09-24 LAB — CSF CELL COUNT WITH DIFFERENTIAL
RBC Count, CSF: 30 /mm3 — ABNORMAL HIGH
TUBE #: 1
WBC, CSF: 2 /mm3 (ref 0–5)

## 2016-09-24 LAB — PROTEIN AND GLUCOSE, CSF
GLUCOSE CSF: 116 mg/dL — AB (ref 40–70)
TOTAL PROTEIN, CSF: 63 mg/dL — AB (ref 15–45)

## 2016-09-24 LAB — PROTIME-INR
INR: 1.43
PROTHROMBIN TIME: 17.6 s — AB (ref 11.4–15.2)

## 2016-09-24 LAB — VALPROIC ACID LEVEL: Valproic Acid Lvl: 22 ug/mL — ABNORMAL LOW (ref 50.0–100.0)

## 2016-09-24 LAB — PREPARE RBC (CROSSMATCH)

## 2016-09-24 LAB — MAGNESIUM: MAGNESIUM: 2.3 mg/dL (ref 1.7–2.4)

## 2016-09-24 SURGERY — RADIOLOGY WITH ANESTHESIA
Anesthesia: General

## 2016-09-24 MED ORDER — SODIUM CHLORIDE 0.9 % IV SOLN: 200.0000 mg | Freq: Two times a day (BID) | INTRAVENOUS | Status: DC

## 2016-09-24 MED ORDER — PHENYLEPHRINE HCL 10 MG/ML IJ SOLN
INTRAVENOUS | Status: DC | PRN
  Administered 2016-09-24: 1 ug/min via INTRAVENOUS

## 2016-09-24 MED ORDER — SODIUM CHLORIDE 0.9 % IV SOLN
200.0000 mg | Freq: Two times a day (BID) | INTRAVENOUS | Status: DC
  Administered 2016-09-24 – 2016-09-28 (×7): 200 mg via INTRAVENOUS
  Filled 2016-09-24 (×14): qty 20

## 2016-09-24 MED ORDER — MIDAZOLAM HCL 5 MG/5ML IJ SOLN
INTRAMUSCULAR | Status: DC | PRN
  Administered 2016-09-24: 2 mg via INTRAVENOUS

## 2016-09-24 MED ORDER — ROCURONIUM BROMIDE 100 MG/10ML IV SOLN
INTRAVENOUS | Status: DC | PRN
  Administered 2016-09-24: 30 mg via INTRAVENOUS
  Administered 2016-09-24 (×2): 20 mg via INTRAVENOUS

## 2016-09-24 MED ORDER — SODIUM CHLORIDE 0.9 % IV SOLN: INTRAVENOUS | Status: DC | PRN

## 2016-09-24 MED ORDER — FENTANYL CITRATE (PF) 100 MCG/2ML IJ SOLN
INTRAMUSCULAR | Status: DC | PRN
  Administered 2016-09-24 (×2): 50 ug via INTRAVENOUS

## 2016-09-24 MED ORDER — FUROSEMIDE 10 MG/ML IJ SOLN
20.0000 mg | Freq: Four times a day (QID) | INTRAMUSCULAR | Status: AC
  Administered 2016-09-24 – 2016-09-25 (×3): 20 mg via INTRAVENOUS
  Filled 2016-09-24 (×3): qty 2

## 2016-09-24 MED ORDER — SODIUM CHLORIDE 0.9 % IV SOLN
Freq: Once | INTRAVENOUS | Status: AC
Start: 2016-09-24 — End: 2016-09-24
  Administered 2016-09-24: 07:00:00 via INTRAVENOUS

## 2016-09-24 MED ORDER — LACTATED RINGERS IV SOLN
INTRAVENOUS | Status: DC | PRN
  Administered 2016-09-24: 09:00:00 via INTRAVENOUS

## 2016-09-24 MED ORDER — ALBUTEROL SULFATE (2.5 MG/3ML) 0.083% IN NEBU
2.5000 mg | INHALATION_SOLUTION | Freq: Four times a day (QID) | RESPIRATORY_TRACT | Status: DC
  Administered 2016-09-24 – 2016-10-05 (×43): 2.5 mg via RESPIRATORY_TRACT
  Filled 2016-09-24 (×43): qty 3

## 2016-09-24 NOTE — Progress Notes (Signed)
[facility] Progress Note Patient Name: Jason [name] DOB: [birth year] MRN: [id number]   Date of Service  09/13/2016  HPI/Events of Note  Hb 5.9. No evidence of active bleed.  eICU Interventions  Transfuse 1 unit PRBC     Intervention Category Evaluation Type: Other  Evy Lutterman 09/07/2016, 4:49 AM

## 2016-09-24 NOTE — Anesthesia Postprocedure Evaluation (Signed)
Anesthesia Post Note  Patient: Jason [name]  Procedure(s) Performed: Procedure(s) (LRB): CERVICAL SPINE ASPIRATION (N/A)  Patient location during evaluation: ICU Level of consciousness: patient remains intubated per anesthesia plan Pain management: pain level controlled Vital Signs Assessment: post-procedure vital signs reviewed and stable Respiratory status: patient on ventilator - see flowsheet for VS Cardiovascular status: stable Anesthetic complications: no       Last Vitals:  Vitals:   09/07/2016 1623  1700  BP:  (!) 92/58  Pulse:  62  Resp:  13  Temp: 36.3 C     Last Pain:  Vitals:   10/03/2016 1623  TempSrc: Oral  PainSc:                  Ora Mcnatt

## 2016-09-24 NOTE — Procedures (Signed)
S/P C1 _C2 fluoro guided puncture for CSF

## 2016-09-24 NOTE — Transfer of Care (Signed)
Immediate Anesthesia Transfer of Care Note  Patient: Jason [name]  Procedure(s) Performed: Procedure(s): CERVICAL SPINE ASPIRATION (N/A)  Patient Location: SICU  Anesthesia Type:General  Level of Consciousness: obtunded and Patient remains intubated per anesthesia plan  Airway & Oxygen Therapy: Patient remains intubated per anesthesia plan and Patient placed on Ventilator (see vital sign flow sheet for setting)  Post-op Assessment: Report given to RN and Post -op Vital signs reviewed and stable  Post vital signs: Reviewed and stable  Last Vitals:  Vitals:   09/28/2016 0845 09/11/2016 0900  BP: (!) 93/54 (!) 85/51  Pulse: 61 62  Resp: (!) 0 (!) 0  Temp: 36.5 C     Last Pain:  Vitals:   09/18/2016 0845  TempSrc: Oral  PainSc:       Patients Stated Pain Goal: 2 ([id number])  Complications: No apparent anesthesia complications

## 2016-09-24 NOTE — Anesthesia Preprocedure Evaluation (Addendum)
Anesthesia Evaluation  Patient identified by MRN, date of birth, ID band Patient unresponsive    Reviewed: Allergy & Precautions, NPO status , Patient's Chart, lab work & pertinent test results  Airway Mallampati: Trach       Dental   Pulmonary asthma , pneumonia, former smoker,           Cardiovascular + dysrhythmias      Neuro/Psych Seizures -,  PSYCHIATRIC DISORDERS Anxiety Depression CVA    GI/Hepatic GERD  ,  Endo/Other  diabetes  Renal/GU Renal disease     Musculoskeletal  (+) Arthritis , Fibromyalgia -  Abdominal   Peds  Hematology  (+) anemia ,   Anesthesia Other Findings   Reproductive/Obstetrics                             Anesthesia Physical Anesthesia Plan  ASA: III  Anesthesia Plan: General   Post-op Pain Management:    Induction: Intravenous  Airway Management Planned: Tracheostomy  Additional Equipment:   Intra-op Plan:   Post-operative Plan:   Informed Consent: I have reviewed the patients History and Physical, chart, labs and discussed the procedure including the risks, benefits and alternatives for the proposed anesthesia with the patient or authorized representative who has indicated his/her understanding and acceptance.     Plan Discussed with: CRNA and Anesthesiologist  Anesthesia Plan Comments:         Anesthesia Quick Evaluation

## 2016-09-24 NOTE — Procedures (Signed)
History: 30 yo M with refractory status epilepticus.   Sedation: None, versed until 2pm yesterday  Technique: This is a 21 channel routine scalp EEG performed at the bedside with bipolar and monopolar montages arranged in accordance to the international 10/20 system of electrode placement. One channel was dedicated to EKG recording.    Background: The background consists of low voltage irregular diffuse delta activity. There is no posterior dominant rhythm seen. There are no periodic discharges or epileptiform activity.   Photic stimulation: Physiologic driving is not performed  EEG Abnormalities: 1) low voltage irregular delta activity  Clinical Interpretation: This EEG is consistent with a generalized non-specific cerebral dysfunction(encephalopathy). There was no seizure or seizure predisposition recorded on this study.   Roland Rack, MD [facility] [phone]  If 7pm- 7am, please page neurology on call as listed in [place].

## 2016-09-24 NOTE — Progress Notes (Signed)
EEG Completed; Results Pending  

## 2016-09-24 NOTE — Progress Notes (Signed)
Subjective:  Patient is unresponsive   Antibiotics:  Anti-infectives    Start     Dose/Rate Route Frequency Ordered Stop   09/11/16 2200  ceFEPIme (MAXIPIME) 2 g in dextrose 5 % 50 mL IVPB  Status:  Discontinued     2 g 100 mL/hr over 30 Minutes Intravenous Every 8 hours 09/11/16 1830 09/11/16 1838   09/11/16 2200  ceFEPIme (MAXIPIME) 2 g in dextrose 5 % 50 mL IVPB  Status:  Discontinued     2 g 100 mL/hr over 30 Minutes Intravenous Every 8 hours 09/11/16 1838 09/15/16 1441   09/11/16 2000  ceFEPIme (MAXIPIME) 2 g in dextrose 5 % 50 mL IVPB  Status:  Discontinued     2 g 100 mL/hr over 30 Minutes Intravenous Every 8 hours 09/11/16 1813 09/11/16 1830   09/11/16 1400  meropenem (MERREM) 1 g in sodium chloride 0.9 % 100 mL IVPB  Status:  Discontinued     1 g 200 mL/hr over 30 Minutes Intravenous Every 8 hours 09/11/16 1150 09/11/16 1813   09/11/16 1400  sulfamethoxazole-trimethoprim (BACTRIM) 240 mg of trimethoprim in dextrose 5 % 250 mL IVPB  Status:  Discontinued     240 mg of trimethoprim 265 mL/hr over 60 Minutes Intravenous Every 8 hours 09/11/16 1156 09/15/16 1441   09/10/16 2100  levofloxacin (LEVAQUIN) IVPB 750 mg  Status:  Discontinued     750 mg 100 mL/hr over 90 Minutes Intravenous Every 24 hours 09/10/16 1849 09/11/16 1115   09/10/16 2000  ceFEPIme (MAXIPIME) 2 g in dextrose 5 % 50 mL IVPB  Status:  Discontinued     2 g 100 mL/hr over 30 Minutes Intravenous Every 8 hours 09/10/16 1847 09/11/16 1140   09/08/16 1700  imipenem-cilastatin (PRIMAXIN) 1,000 mg in sodium chloride 0.9 % 250 mL IVPB  Status:  Discontinued     1,000 mg 250 mL/hr over 60 Minutes Intravenous Every 8 hours 09/08/16 1159 09/10/16 1736   09/01/16 1300  anidulafungin (ERAXIS) 100 mg in sodium chloride 0.9 % 100 mL IVPB  Status:  Discontinued     100 mg over 90 Minutes Intravenous Every 24 hours 08/31/16 1240 09/02/16 1252   09/01/16 1200  vancomycin (VANCOCIN) IVPB 750 mg/150 ml premix   Status:  Discontinued     750 mg 150 mL/hr over 60 Minutes Intravenous Every 24 hours 09/01/16 1145 09/01/16 1712   08/31/16 1700  imipenem-cilastatin (PRIMAXIN) 500 mg in sodium chloride 0.9 % 100 mL IVPB  Status:  Discontinued     500 mg 200 mL/hr over 30 Minutes Intravenous Every 8 hours 08/31/16 1240 09/08/16 1159   08/31/16 1300  anidulafungin (ERAXIS) 200 mg in sodium chloride 0.9 % 200 mL IVPB     200 mg over 180 Minutes Intravenous  Once 08/31/16 1240 08/31/16 1810   08/25/2016 0557  tigecycline (TYGACIL) 50 mg in sodium chloride 0.9 % 100 mL IVPB  Status:  Discontinued     50 mg 200 mL/hr over 30 Minutes Intravenous Every 12 hours 08/28/16 1758 08/31/16 1151   08/28/16 1800  tigecycline (TYGACIL) 100 mg in sodium chloride 0.9 % 100 mL IVPB  Status:  Discontinued     100 mg 200 mL/hr over 30 Minutes Intravenous  Once 08/28/16 1758 08/31/16 1424   08/28/16 1300  linezolid (ZYVOX) IVPB 600 mg  Status:  Discontinued     600 mg 300 mL/hr over 60 Minutes Intravenous Every 12 hours 08/28/16 1239 08/31/16 2014  08/28/16 0200  aztreonam (AZACTAM) 1 g in dextrose 5 % 50 mL IVPB  Status:  Discontinued     1 g 100 mL/hr over 30 Minutes Intravenous Every 8 hours 08/27/16 1802 08/28/16 1232   08/27/16 2000  vancomycin (VANCOCIN) 500 mg in sodium chloride 0.9 % 100 mL IVPB  Status:  Discontinued     500 mg 100 mL/hr over 60 Minutes Intravenous Every 12 hours 08/27/16 1802 08/28/16 1239   08/27/16 1800  aztreonam (AZACTAM) 2 g in dextrose 5 % 50 mL IVPB     2 g 100 mL/hr over 30 Minutes Intravenous  Once 08/27/16 1634 08/27/16 1852   08/27/16 1600  levofloxacin (LEVAQUIN) IVPB 750 mg  Status:  Discontinued     750 mg 100 mL/hr over 90 Minutes Intravenous Every 24 hours 08/22/2016 1324 08/31/2016 1522   08/27/16 0000  vancomycin (VANCOCIN) 500 mg in sodium chloride 0.9 % 100 mL IVPB  Status:  Discontinued     500 mg 100 mL/hr over 60 Minutes Intravenous Every 12 hours 08/19/2016 1324 08/24/2016 1522     08/23/2016 2200  aztreonam (AZACTAM) 1 g in dextrose 5 % 50 mL IVPB  Status:  Discontinued     1 g 100 mL/hr over 30 Minutes Intravenous Every 8 hours 08/14/2016 1324 08/14/2016 1522   08/20/2016 1600  linezolid (ZYVOX) tablet 600 mg  Status:  Discontinued     600 mg Oral Every 12 hours 09/04/2016 1458 08/27/16 1347   09/05/2016 1300  levofloxacin (LEVAQUIN) IVPB 750 mg  Status:  Discontinued     750 mg 100 mL/hr over 90 Minutes Intravenous  Once 08/15/2016 1246 08/08/2016 1522   09/03/2016 1300  aztreonam (AZACTAM) 2 g in dextrose 5 % 50 mL IVPB  Status:  Discontinued     2 g 100 mL/hr over 30 Minutes Intravenous  Once 08/21/2016 1246 08/23/2016 1522   08/19/2016 1300  vancomycin (VANCOCIN) IVPB 1000 mg/200 mL premix  Status:  Discontinued     1,000 mg 200 mL/hr over 60 Minutes Intravenous  Once 08/11/2016 1246 08/14/2016 1522      Medications: Scheduled Meds: . artificial tears   Both Eyes Q8H  . baclofen  10 mg Oral QID  . budesonide (PULMICORT) nebulizer solution  0.5 mg Nebulization BID  . chlorhexidine gluconate (MEDLINE KIT)  15 mL Mouth Rinse BID  . furosemide  20 mg Intravenous Q6H  . insulin aspart  0-15 Units Subcutaneous Q4H  . insulin glargine  5 Units Subcutaneous QHS  . ipratropium  0.5 mg Nebulization BID  . lacosamide (VIMPAT) IV  100 mg Intravenous Q12H  . levETIRAcetam  1,500 mg Intravenous Q12H  . mouth rinse  15 mL Mouth Rinse 10 times per day  . midazolam  40 mg Intravenous Once  . midodrine  20 mg Per Tube TID WC  . oxybutynin  5 mg Per Tube BID  . pantoprazole sodium  40 mg Per Tube Daily  . sodium chloride flush  10-40 mL Intracatheter Q12H  . topiramate  100 mg Oral BID  . valproate sodium  1,250 mg Intravenous Q6H   Continuous Infusions: . dextrose 5% lactated ringers 75 mL/hr at 09/15/2016 1602  . feeding supplement (VITAL AF 1.2 CAL) 1,000 mL (09/21/2016 1602)  . lactated ringers Stopped (09/21/16 1900)  . midazolam (VERSED) infusion Stopped (09/23/16 1400)  . phenylephrine  (NEO-SYNEPHRINE) Adult infusion Stopped (09/16/2016 0615)   PRN Meds:.acetaminophen (TYLENOL) oral liquid 160 mg/5 mL, fentaNYL (SUBLIMAZE) injection, LORazepam, metoprolol, naLOXone (  NARCAN)  injection, sodium chloride flush    Objective: Weight change: 6 lb (2.722 kg)  Intake/Output Summary (Last 24 hours) at 09/23/2016 1753 Last data filed at 09/26/2016 1700  Gross per 24 hour  Intake          3446.79 ml  Output             1475 ml  Net          1971.79 ml   Blood pressure (!) 92/58, pulse 62, temperature 97.4 F (36.3 C), temperature source Oral, resp. rate 13, height 5\' 8"  (1.727 m), weight 112 lb (50.8 kg), SpO2 100 %. Temp:  [94.3 F (34.6 C)-97.7 F (36.5 C)] 97.4 F (36.3 C) (12/18 1623) Pulse Rate:  [52-82] 62 (12/18 1700) Resp:  [0-25] 13 (12/18 1700) BP: (70-122)/(42-70) 92/58 (12/18 1700) SpO2:  [100 %] 100 % (12/18 1700) FiO2 (%):  [30 %] 30 % (12/18 1530) Weight:  [112 lb (50.8 kg)] 112 lb (50.8 kg) (12/18 0400)  Physical Exam: General: Unresponsive,  HEENT: anicteric sclera, pupils edematous fairly fixed , face is more edematous the last time I saw him [facility] regular rate, normal r,  no murmur rubs or gallops Chest: clear to auscultation bilaterally, no wheezing, rales or rhonchi Abdomen: soft nondistended, normal bowel sounds, Extremities:skin: decubitus ulcers not examined today.  Neuro: No new focal deficits  CBC:  CBC Latest Ref Rng & Units 09/17/2016 09/17/2016 09/23/2016  WBC 4.0 - 10.5 K/uL 16.8(H) 17.2(H) 27.4(H)  Hemoglobin 13.0 - 17.0 g/dL 7.4(L) 5.9(LL) 7.0(L)  Hematocrit 39.0 - 52.0 % 22.4(L) 18.0(L) 21.9(L)  Platelets 150 - 400 K/uL 219 233 351     BMET  Recent Labs  09/23/16 0415 09/23/2016 0400  NA 146* 144  K 3.2* 3.4*  CL 124* 124*  CO2 14* 15*  GLUCOSE 93 325*  BUN 15 13  CREATININE 0.45* 0.50*  CALCIUM 8.4* 8.2*     Liver Panel  No results for input(s): PROT, ALBUMIN, AST, ALT, ALKPHOS, BILITOT, BILIDIR, IBILI in the last 72  hours.     Sedimentation Rate No results for input(s): ESRSEDRATE in the last 72 hours. C-Reactive Protein No results for input(s): CRP in the last 72 hours.  Micro Results: Recent Results (from the past 720 hour(s))  Urine culture     Status: Abnormal   Collection Time: 08/15/2016 12:04 PM  Result Value Ref Range Status   Specimen Description URINE, SUPRAPUBIC  Final   Special Requests NONE  Final   Culture (A)  Final    >=100,000 COLONIES/mL ENTEROCOCCUS FAECALIS >=100,000 COLONIES/mL YEAST 20,000 COLONIES/mL ACINETOBACTER CALCOACETICUS/BAUMANNII COMPLEX    Report Status 09/02/2016 FINAL  Final   Organism ID, Bacteria ENTEROCOCCUS FAECALIS (A)  Final   Organism ID, Bacteria ACINETOBACTER CALCOACETICUS/BAUMANNII COMPLEX (A)  Final      Susceptibility   Acinetobacter calcoaceticus/baumannii complex - MIC*    CEFTAZIDIME >=64 RESISTANT Resistant     CEFTRIAXONE >=64 RESISTANT Resistant     CIPROFLOXACIN >=4 RESISTANT Resistant     GENTAMICIN 4 SENSITIVE Sensitive     IMIPENEM 1 SENSITIVE Sensitive     PIP/TAZO <=4 SENSITIVE Sensitive     TRIMETH/SULFA >=320 RESISTANT Resistant     CEFEPIME >=64 RESISTANT Resistant     AMPICILLIN/SULBACTAM 4 SENSITIVE Sensitive     * 20,000 COLONIES/mL ACINETOBACTER CALCOACETICUS/BAUMANNII COMPLEX   Enterococcus faecalis - MIC*    AMPICILLIN <=2 SENSITIVE Sensitive     LEVOFLOXACIN >=8 RESISTANT Resistant     NITROFURANTOIN <=16  SENSITIVE Sensitive     VANCOMYCIN 1 SENSITIVE Sensitive     * >=100,000 COLONIES/mL ENTEROCOCCUS FAECALIS  Blood Culture (routine x 2)     Status: Abnormal   Collection Time: 08/10/2016 12:07 PM  Result Value Ref Range Status   Specimen Description BLOOD LEFT WRIST  Final   Special Requests BOTTLES DRAWN AEROBIC AND ANAEROBIC 10CC  Final   Culture  Setup Time   Final    IN BOTH AEROBIC AND ANAEROBIC BOTTLES GRAM POSITIVE COCCI IN PAIRS AND CHAINS CRITICAL RESULT CALLED TO, READ BACK BY AND VERIFIED WITH: ANDY  JOHNSTON,PHARMD _0  08/28/16 MKELLY,MLT    Culture STREPTOCOCCUS CONSTELLATUS (A)  Final   Report Status 08/31/2016 FINAL  Final   Organism ID, Bacteria STREPTOCOCCUS CONSTELLATUS  Final      Susceptibility   Streptococcus constellatus - MIC*    PENICILLIN <=0.06 SENSITIVE Sensitive     CEFTRIAXONE 0.25 SENSITIVE Sensitive     ERYTHROMYCIN <=0.12 SENSITIVE Sensitive     LEVOFLOXACIN <=0.25 SENSITIVE Sensitive     VANCOMYCIN 0.25 SENSITIVE Sensitive     * STREPTOCOCCUS CONSTELLATUS  Blood Culture ID Panel (Reflexed)     Status: Abnormal   Collection Time: 08/15/2016 12:07 PM  Result Value Ref Range Status   Enterococcus species NOT DETECTED NOT DETECTED Final   Listeria monocytogenes NOT DETECTED NOT DETECTED Final   Staphylococcus species NOT DETECTED NOT DETECTED Final   Staphylococcus aureus NOT DETECTED NOT DETECTED Final   Streptococcus species DETECTED (A) NOT DETECTED Final    Comment: CRITICAL RESULT CALLED TO, READ BACK BY AND VERIFIED WITH: ANDY JOHNSTON,PHARMD _1  08/28/16 MKELLY,MLT    Streptococcus agalactiae NOT DETECTED NOT DETECTED Final   Streptococcus pneumoniae NOT DETECTED NOT DETECTED Final   Streptococcus pyogenes NOT DETECTED NOT DETECTED Final   Acinetobacter baumannii NOT DETECTED NOT DETECTED Final   Enterobacteriaceae species NOT DETECTED NOT DETECTED Final   Enterobacter cloacae complex NOT DETECTED NOT DETECTED Final   Escherichia coli NOT DETECTED NOT DETECTED Final   Klebsiella oxytoca NOT DETECTED NOT DETECTED Final   Klebsiella pneumoniae NOT DETECTED NOT DETECTED Final   Proteus species NOT DETECTED NOT DETECTED Final   Serratia marcescens NOT DETECTED NOT DETECTED Final   Haemophilus influenzae NOT DETECTED NOT DETECTED Final   Neisseria meningitidis NOT DETECTED NOT DETECTED Final   Pseudomonas aeruginosa NOT DETECTED NOT DETECTED Final   Candida albicans NOT DETECTED NOT DETECTED Final   Candida glabrata NOT DETECTED NOT DETECTED Final    Candida krusei NOT DETECTED NOT DETECTED Final   Candida parapsilosis NOT DETECTED NOT DETECTED Final   Candida tropicalis NOT DETECTED NOT DETECTED Final  Culture, blood (x 2)     Status: Abnormal   Collection Time: 08/14/2016  6:10 PM  Result Value Ref Range Status   Specimen Description BLOOD RIGHT HAND  Final   Special Requests IN PEDIATRIC BOTTLE 2CC  Final   Culture  Setup Time   Final    GRAM POSITIVE COCCI IN PAIRS IN CHAINS IN PEDIATRIC BOTTLE CRITICAL RESULT CALLED TO, READ BACK BY AND VERIFIED WITH: VERONDA BRYK,PHARMD _2  08/11/2016 MKELLY,MLT    Culture (A)  Final    STREPTOCOCCUS CONSTELLATUS SUSCEPTIBILITIES PERFORMED ON PREVIOUS CULTURE WITHIN THE LAST 5 DAYS.    Report Status 08/31/2016 FINAL  Final  Culture, blood (x 2)     Status: Abnormal   Collection Time: 09/02/2016  6:14 PM  Result Value Ref Range Status   Specimen Description BLOOD RIGHT THUMB  Final  Special Requests IN PEDIATRIC BOTTLE 1CC  Final   Culture  Setup Time   Final    GRAM POSITIVE COCCI IN PAIRS IN PEDIATRIC BOTTLE CRITICAL VALUE NOTED.  VALUE IS CONSISTENT WITH PREVIOUSLY REPORTED AND CALLED VALUE.    Culture (A)  Final    STREPTOCOCCUS CONSTELLATUS SUSCEPTIBILITIES PERFORMED ON PREVIOUS CULTURE WITHIN THE LAST 5 DAYS.    Report Status 08/31/2016 FINAL  Final  MRSA PCR Screening     Status: Abnormal   Collection Time: 08/27/16  3:50 AM  Result Value Ref Range Status   MRSA by PCR POSITIVE (A) NEGATIVE Final    Comment:        The GeneXpert MRSA Assay (FDA approved for NASAL specimens only), is one component of a comprehensive MRSA colonization surveillance program. It is not intended to diagnose MRSA infection nor to guide or monitor treatment for MRSA infections. RESULT CALLED TO, READ BACK BY AND VERIFIED WITH: EVERETTE,D RN 08/27/16 AT 0515 SKEEN,P   Aerobic/Anaerobic Culture (surgical/deep wound)     Status: None   Collection Time: 08/25/2016  4:01 PM  Result Value Ref Range  Status   Specimen Description TISSUE BONE  Final   Special Requests SACRAL  Final   Gram Stain   Final    ABUNDANT WBC PRESENT, PREDOMINANTLY PMN ABUNDANT GRAM NEGATIVE RODS ABUNDANT GRAM POSITIVE COCCI IN PAIRS FEW GRAM POSITIVE RODS    Culture   Final    MODERATE KLEBSIELLA PNEUMONIAE Confirmed Extended Spectrum Beta-Lactamase Producer (ESBL) MODERATE ENTEROCOCCUS FAECALIS MODERATE STREPTOCOCCUS CONSTELLATUS MIXED ANAEROBIC FLORA PRESENT.  CALL LAB IF FURTHER IID REQUIRED.    Report Status 09/03/2016 FINAL  Final   Organism ID, Bacteria KLEBSIELLA PNEUMONIAE  Final   Organism ID, Bacteria ENTEROCOCCUS FAECALIS  Final      Susceptibility   Enterococcus faecalis - MIC*    AMPICILLIN <=2 SENSITIVE Sensitive     VANCOMYCIN 1 SENSITIVE Sensitive     GENTAMICIN SYNERGY SENSITIVE Sensitive     * MODERATE ENTEROCOCCUS FAECALIS   Klebsiella pneumoniae - MIC*    AMPICILLIN >=32 RESISTANT Resistant     CEFAZOLIN >=64 RESISTANT Resistant     CEFEPIME >=64 RESISTANT Resistant     CEFTAZIDIME >=64 RESISTANT Resistant     CEFTRIAXONE >=64 RESISTANT Resistant     CIPROFLOXACIN 2 INTERMEDIATE Intermediate     GENTAMICIN >=16 RESISTANT Resistant     IMIPENEM <=0.25 SENSITIVE Sensitive     TRIMETH/SULFA >=320 RESISTANT Resistant     AMPICILLIN/SULBACTAM >=32 RESISTANT Resistant     PIP/TAZO >=128 RESISTANT Resistant     Extended ESBL POSITIVE Resistant     * MODERATE KLEBSIELLA PNEUMONIAE  Culture, blood (Routine X 2) w Reflex to ID Panel     Status: None   Collection Time: 08/30/16  9:30 AM  Result Value Ref Range Status   Specimen Description BLOOD LEFT ANTECUBITAL  Final   Special Requests BOTTLES DRAWN AEROBIC ONLY 5CC  Final   Culture NO GROWTH 5 DAYS  Final   Report Status 09/04/2016 FINAL  Final  Culture, blood (Routine X 2) w Reflex to ID Panel     Status: None   Collection Time: 08/30/16  9:35 AM  Result Value Ref Range Status   Specimen Description BLOOD BLOOD LEFT ARM   Final   Special Requests BOTTLES DRAWN AEROBIC ONLY 5CC  Final   Culture NO GROWTH 5 DAYS  Final   Report Status 09/04/2016 FINAL  Final  Culture, respiratory (NON-Expectorated)  Status: None   Collection Time: 08/30/16  3:56 PM  Result Value Ref Range Status   Specimen Description TRACHEAL ASPIRATE  Final   Special Requests NONE  Final   Gram Stain   Final    ABUNDANT WBC PRESENT,BOTH PMN AND MONONUCLEAR MODERATE GRAM POSITIVE COCCI IN PAIRS FEW GRAM VARIABLE ROD FEW YEAST    Culture   Final    MODERATE KLEBSIELLA PNEUMONIAE MODERATE ACINETOBACTER CALCOACETICUS/BAUMANNII COMPLEX Confirmed Extended Spectrum Beta-Lactamase Producer (ESBL) for KLEBSIELLA PNEUMONIAE    Report Status 09/02/2016 FINAL  Final   Organism ID, Bacteria KLEBSIELLA PNEUMONIAE  Final   Organism ID, Bacteria ACINETOBACTER CALCOACETICUS/BAUMANNII COMPLEX  Final      Susceptibility   Acinetobacter calcoaceticus/baumannii complex - MIC*    CEFTAZIDIME >=64 RESISTANT Resistant     CEFTRIAXONE >=64 RESISTANT Resistant     CIPROFLOXACIN >=4 RESISTANT Resistant     GENTAMICIN 4 SENSITIVE Sensitive     IMIPENEM 1 SENSITIVE Sensitive     PIP/TAZO >=128 RESISTANT Resistant     TRIMETH/SULFA >=320 RESISTANT Resistant     CEFEPIME >=64 RESISTANT Resistant     AMPICILLIN/SULBACTAM 4 SENSITIVE Sensitive     * MODERATE ACINETOBACTER CALCOACETICUS/BAUMANNII COMPLEX   Klebsiella pneumoniae - MIC*    AMPICILLIN >=32 RESISTANT Resistant     CEFAZOLIN >=64 RESISTANT Resistant     CEFEPIME >=64 RESISTANT Resistant     CEFTAZIDIME 16 RESISTANT Resistant     CEFTRIAXONE >=64 RESISTANT Resistant     CIPROFLOXACIN 1 SENSITIVE Sensitive     GENTAMICIN >=16 RESISTANT Resistant     IMIPENEM <=0.25 SENSITIVE Sensitive     TRIMETH/SULFA >=320 RESISTANT Resistant     AMPICILLIN/SULBACTAM >=32 RESISTANT Resistant     PIP/TAZO 8 SENSITIVE Sensitive     Extended ESBL POSITIVE Resistant     * MODERATE KLEBSIELLA PNEUMONIAE    Fungus Culture With Stain     Status: None   Collection Time: 08/30/16  3:56 PM  Result Value Ref Range Status   Fungus Stain Final report  Final   Fungus (Mycology) Culture Preliminary report  Final    Comment: (NOTE) Performed At: [facility] [place], [place] [id number] Lindon Romp MD WL:[id number]    Fungal Source TRACHEAL ASPIRATE  Final  Fungus Culture Result     Status: None   Collection Time: 08/30/16  3:56 PM  Result Value Ref Range Status   Result 1 Yeast observed  Final    Comment: (NOTE) Performed At: [facility] [address] [place], [place] [id number] Lindon Romp MD EH:[id number]   Fungal organism reflex     Status: None   Collection Time: 08/30/16  3:56 PM  Result Value Ref Range Status   Fungal result 1 Candida albicans  Final    Comment: (NOTE) Light growth Performed At: [facility] [address] [place], [place] [id number] Lindon Romp MD XA:[id number]   Culture, respiratory (NON-Expectorated)     Status: None   Collection Time: 08/31/16  1:20 AM  Result Value Ref Range Status   Specimen Description TRACHEAL ASPIRATE  Final   Special Requests NONE  Final   Gram Stain   Final    ABUNDANT WBC PRESENT,BOTH PMN AND MONONUCLEAR NO ORGANISMS SEEN    Culture   Final    MODERATE ACINETOBACTER CALCOACETICUS/BAUMANNII COMPLEX SUSCEPTIBILITIES PERFORMED ON PREVIOUS CULTURE WITHIN THE LAST 5 DAYS.    Report Status 09/04/2016 FINAL  Final  Culture, respiratory (NON-Expectorated)     Status:  None   Collection Time: 09/06/16 10:36 PM  Result Value Ref Range Status   Specimen Description TRACHEAL ASPIRATE  Final   Special Requests Normal  Final   Gram Stain   Final    MODERATE WBC PRESENT,BOTH PMN AND MONONUCLEAR FEW GRAM NEGATIVE RODS    Culture   Final    ABUNDANT PSEUDOMONAS AERUGINOSA ABUNDANT STENOTROPHOMONAS MALTOPHILIA THE PSEUDOMONAS AERUGINOSA IS A  MULTI-DRUG RESISTANT ORGANISM Results  Called to: RN N.WELLINGTON  [id number] MLM    Report Status 09/10/2016 FINAL  Final   Organism ID, Bacteria STENOTROPHOMONAS MALTOPHILIA  Final   Organism ID, Bacteria PSEUDOMONAS AERUGINOSA  Final      Susceptibility   Pseudomonas aeruginosa - MIC*    CEFTAZIDIME 8 SENSITIVE Sensitive     CIPROFLOXACIN >=4 RESISTANT Resistant     GENTAMICIN >=16 RESISTANT Resistant     IMIPENEM >=16 RESISTANT Resistant     PIP/TAZO >=128 RESISTANT Resistant     CEFEPIME 8 SENSITIVE Sensitive     * ABUNDANT PSEUDOMONAS AERUGINOSA   Stenotrophomonas maltophilia - MIC*    LEVOFLOXACIN 1 SENSITIVE Sensitive     TRIMETH/SULFA <=20 SENSITIVE Sensitive     * ABUNDANT STENOTROPHOMONAS MALTOPHILIA  Culture, respiratory (NON-Expectorated)     Status: None   Collection Time: 09/14/16  1:19 PM  Result Value Ref Range Status   Specimen Description TRACHEAL ASPIRATE  Final   Special Requests NONE  Final   Gram Stain   Final    ABUNDANT WBC PRESENT, PREDOMINANTLY PMN RARE GRAM NEGATIVE RODS MODERATE YEAST    Culture   Final    ABUNDANT PSEUDOMONAS AERUGINOSA MODERATE STENOTROPHOMONAS MALTOPHILIA PSEUDOMONAS AERUGINOSA MULTI-DRUG RESISTANT ORGANISM Results Called to: Karlene Einstein PHARMD, AT 0820 09/18/2016 BY D. VANHOOK    Report Status 10/02/2016 FINAL  Final   Organism ID, Bacteria STENOTROPHOMONAS MALTOPHILIA  Final   Organism ID, Bacteria PSEUDOMONAS AERUGINOSA  Final      Susceptibility   Pseudomonas aeruginosa - MIC*    CEFTAZIDIME 8 SENSITIVE Sensitive     CIPROFLOXACIN >=4 RESISTANT Resistant     GENTAMICIN >=16 RESISTANT Resistant     IMIPENEM >=16 RESISTANT Resistant     CEFEPIME 16 INTERMEDIATE Intermediate     * ABUNDANT PSEUDOMONAS AERUGINOSA   Stenotrophomonas maltophilia - MIC*    LEVOFLOXACIN 1 SENSITIVE Sensitive     TRIMETH/SULFA <=20 SENSITIVE Sensitive     * MODERATE STENOTROPHOMONAS MALTOPHILIA  CSF culture with Stat gram stain     Status: None (Preliminary result)    Collection Time: 09/25/2016 12:45 PM  Result Value Ref Range Status   Specimen Description CSF  Final   Special Requests NONE  Final   Gram Stain   Final    WBC PRESENT,BOTH PMN AND MONONUCLEAR NO ORGANISMS SEEN CYTOSPIN SMEAR    Culture PENDING  Incomplete   Report Status PENDING  Incomplete    Studies/Results: Dg Chest Port 1 View  Result Date: 09/14/2016 CLINICAL DATA:  Respiratory failure, tracheostomy patient. History of asthma, diabetes, chronic osteomyelitis EXAM: PORTABLE CHEST 1 VIEW COMPARISON:  Portable chest x-ray of September 20, 2016. FINDINGS: The lungs are reasonably well inflated. The interstitial markings in the left mid and lower lung are slightly more conspicuous today. On the right there are persistent coarse interstitial and alveolar opacities in the lower lung with partial obscuration of the hemidiaphragm. The heart and pulmonary vascularity are normal. The tracheostomy appliance tip projects at the inferior margin of the clavicular heads. The PICC line tip projects  over the distal third of the SVC. IMPRESSION: Persistent right middle and lower lobe infiltrate compatible with pneumonia. Interval worsening of the left mid and lower lung interstitium likely reflecting pneumonia as well. Electronically Signed   By: David  Martinique M.D.   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)   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([name])   History of pulmonary embolism   DVT (deep venous thrombosis) ([name])   Severe protein-calorie malnutrition ([name])   Dysphagia, pharyngoesophageal phase   Decubitus ulcer of right perineal ischial region, stage 4 ([place])   Decubitus ulcer of left perineal ischial  region, stage 4 ([place])   Sacral decubitus ulcer, stage IV ([name])   Multiple allergies   Recurrent UTI   Respiratory failure, chronic ([name])   Aspiration pneumonia of right lower lobe ([name])   Seizures ([name])   Anxiety state   Anemia of chronic disease   Hyponatremia   Encephalopathy   Other acute osteomyelitis, multiple sites ([place])   Lower GI bleed   Viridans streptococci infection   Coma of unknown cause ([name])   Septic shock ([name])   Anaphylactic syndrome   Acute hypoxemic respiratory failure ([name])   Enterococcal infection   Thrombocytopenia ([name])   Status epilepticus ([name])   Chronic respiratory failure with hypoxia ([name])   Acute respiratory acidosis    Jason [name] is a 30 y.o. 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 He does not have evidence of meningitis at present and we will not give antibiotics to treat for meningitis.  I would continue supportive therapy  The last time I took care of him he did respond dramatically to high-dose "stress dose corticosteroids" though this was in the setting of shock where we were covering for possible anaphylactic reaction  Therefore I would not be against this though obviously would increase risk of infection I don't think it present that he has a clear-cut fulminant faction such as a bacteremia rather more smoldering infection such as his decubitus ulcers and sacral osteomyelitis.    He Does have infiltrates on chest x-ray, and tracheal  aspirate has grown a Stenotrophomonas maltophilia and a multidrug resistant Pseudomonas. 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   #3 Pelvic osteo with ESBL: holding off on therapy at present   LOS: 29 days   [name] 09/10/2016, 5:53 PM

## 2016-09-24 NOTE — Progress Notes (Signed)
Name: Jason [name] MRN: [id number] DOB: 03-21-1986    ADMISSION DATE:  08/09/2016 CONSULTATION DATE:  11/21  REFERRING MD :  Dr. Algis Liming ([facility])   CHIEF COMPLAINT:  Lethargy, vent mgmt   BRIEF PATIENT DESCRIPTION:   30 yo male developed altered mental status after receiving azactam in setting of chronic wound infections (sacrum/heels).  He has hx of C6 spine injury with quadriplegia, tracheostomy status.  SUBJECTIVE:  RN reports ongoing hypothermia requiring [employer].  No clinical evidence of seizures.  Versed gtt turned off 12/17.  PEG tube remains clamped for cistern puncture.  TF reportedly came out of PEG overnight.  Received 1 unit PRBC's overnight for Hgb of 5.9  VITAL SIGNS: BP (!) 93/54   Pulse 61   Temp 97.7 F (36.5 C) (Oral)   Resp (!) 0   Ht 5\' 8"  (1.727 m)   Wt 112 lb (50.8 kg)   SpO2 100%   BMI 17.03 kg/m   INTAKE/OUTPUT: I/O last 3 completed shifts: In: 4742.1 [I.V.:3532.1; Blood:25; Other:360; IV Piggyback:825] Out: 2225 [Urine:1950; Stool:275]  PHYSICAL EXAMINATION: General: chronically ill appearing young male in NAD on vent HEENT: Tracheostomy clean, no drainage  Cardiovascular:  s1 s2 Regular rhythm Pulmonary: resps even non labored on vent, few scattered rhonchi  Abdomen: Soft. Normal bowel sounds. Non distended, colostomy, Peg clamped, TF on hold  Neurological: no response to verbal stimuli, pupils 3 mm =/R EXT:  RUE>LUE edema   CMP Latest Ref Rng & Units 09/16/2016 09/23/2016 09/21/2016  Glucose 65 - 99 mg/dL 325(H) 93 266(H)  BUN 6 - 20 mg/dL 13 15 18   Creatinine 0.61 - 1.24 mg/dL 0.50(L) 0.45(L) 0.49(L)  Sodium 135 - 145 mmol/L 144 146(H) 141  Potassium 3.5 - 5.1 mmol/L 3.4(L) 3.2(L) 3.8  Chloride 101 - 111 mmol/L 124(H) 124(H) 121(H)  CO2 22 - 32 mmol/L 15(L) 14(L) 13(L)  Calcium 8.9 - 10.3 mg/dL 8.2(L) 8.4(L) 8.2(L)  Total Protein 6.5 - 8.1 g/dL - - -  Total Bilirubin 0.3 - 1.2 mg/dL - - -  Alkaline Phos 38 - 126 U/L - - -   AST 15 - 41 U/L - - -  ALT 17 - 63 U/L - - -    CBC Latest Ref Rng & Units  09/23/2016 09/21/2016  WBC 4.0 - 10.5 K/uL 17.2(H) 27.4(H) 26.6(H)  Hemoglobin 13.0 - 17.0 g/dL 5.9(LL) 7.0(L) 8.0(L)  Hematocrit 39.0 - 52.0 % 18.0(L) 21.9(L) 24.9(L)  Platelets 150 - 400 K/uL 233 351 476(H)    Imaging: Dg Chest Port 1 View  Result Date: 09/30/2016 CLINICAL DATA:  Respiratory failure, tracheostomy patient. History of asthma, diabetes, chronic osteomyelitis EXAM: PORTABLE CHEST 1 VIEW COMPARISON:  Portable chest x-ray of September 20, 2016. FINDINGS: The lungs are reasonably well inflated. The interstitial markings in the left mid and lower lung are slightly more conspicuous today. On the right there are persistent coarse interstitial and alveolar opacities in the lower lung with partial obscuration of the hemidiaphragm. The heart and pulmonary vascularity are normal. The tracheostomy appliance tip projects at the inferior margin of the clavicular heads. The PICC line tip projects over the distal third of the SVC. IMPRESSION: Persistent right middle and lower lobe infiltrate compatible with pneumonia. Interval worsening of the left mid and lower lung interstitium likely reflecting pneumonia as well. Electronically Signed   By: David  Martinique M.D.   On: 09/18/2016 07:47   Dg Abd Portable 1v  Result Date: 09/22/2016 CLINICAL DATA:  Leaking PEG  tube. EXAM: PORTABLE ABDOMEN - 1 VIEW COMPARISON:  Abdomen and pelvis CT dated 09/11/2016. FINDINGS: Normal bowel gas pattern. Inferior vena cava filter tip at the L2 level. The PEG tube tip remains projected over the medial aspect of the left upper abdomen. Multiple vertebral compression deformities are again demonstrated. Right iliac bone sclerosis is again demonstrated. Airspace opacity at the medial right lung base. IMPRESSION: 1. The PEG tube appears in satisfactory position in the frontal projection. 2. Right basilar pneumonia, aspiration pneumonitis  or patchy atelectasis. 3. Chronic bone changes. Electronically Signed   By: Claudie Revering M.D.   On: 09/22/2016 13:12   SIGNIFICANT EVENTS: 11/22 - surgical bx sacral bone/wound 12/04 - transfer to ICU with status epilepticus 12/07 - eeg remains c poor neurostatus 12/17 - No seizures overnight, versed drip at 30mg /hr, intermittent hypothermia, decreased PEG tube drainage 12/18 - cervical puncture >>  LINES: PICC LUE 12/15 >>   STUDIES:  EEG 12/06 >> burst suppression MRI brain 12/09 >> acute infarct Lt paracentral mid pons 3 mm, chronic Rt cerebellar infarct, atrophy  MICROBIOLOGY:  Blood Ctx x3 11/19:  3/3 Streptococcus constellatus  Urine Ctx 11/19:  Acinetobacter & Enterococcus faecalis MRSA PCR 11/20:  Positive Sacral Bone Ctx 11/22:  ESBL Kleb pna, Enterococcus faecalis, strep constellatus Blood Ctx x2 11/23:  Negative  Tracheal Asp Ctx 11/23:  Klebsiella pneumoniae & Acinetobacter Tracheal Asp Ctx 11/24:  Klebsiella pneumoniae & Acinetobacter Tracheal Asp Ctx 11/30:  Pseudomonas aeruginosa & Stenotrophomonas   ASSESSMENT / PLAN:  Status epilepticus >> possibly related to Abx vs meningitis  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 - AEDs per neurology - neurology arrange for cervical puncture for CSF sampling, plan for 12/18 per IR  - versed gtt weaned off 12/17 afternoon - monitor for further clinical seizure  Acute on chronic respiratory failure. Tracheostomy status. - full vent support --> wean when MS allows  - defer trach change for now  - f/u CXR PRN - budesonide BID  HCAP, recurrent UTI, osteomyelitis, sacral/heel wounds. - monitor off Abx - ID following - WOC for dressing recommendations - still has Pseudomonas and Stenotrophomonas in sputum from 12/08 >> ?if this is chronic colonization. Will defer to ID  Non-anion gap Metabolic acidosis in setting of hyperchloremia  Hypomagenesemia  Hypokalemia - D5LR @ 75 ml/hr - f/u cmp in  am   - replace electrolytes as indicated  Anemia of critical illness:  f/u CBC after transfusion 12/11 12/12 6.2->8.1 -trend CBC  Rt subclavian/brachial DVT 2nd to PICC line 11/27. 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 Chronic diastolic CHF. Hypotension 2nd to sedation. - pressors to keep MAP > 55 - midodrine - IJ changed to PICC 12/15 - Will need to decide on timing of systemic anticoagulation, hold until after cistern puncture  Severe protein calorie malnutrition. DM gastroparesis. - resume tube feeds post procedure 12/18 > no increase for now, monitor for leakage  DM type 1. - SSI - cont d5LR @ 75 ml/hr  GOC - no family available.  Will need to contact family to discuss plan of care   CC Time:  73 minutes   Noe Gens, NP-C [facility] Pgr: [phone] or if no answer [phone] 09/20/2016, 9:51 AM  Attending Note:  30 year old male with extensive PMH, namely being a quad, who presents in respiratory failure with a seizure and fever.  Patient has  been on a versed induced coma for seizure control.  On exam, patient is sedate.  BP remains marginal.  I reviewed CXR myself, trach in good position.  Will hold off weaning efforts for today.  Patient is to have a cervical tap today to r/o meningitis.  Neurology recommends backing off sedation, will do so.  Hope is that with decreasing sedation that we will be able to decrease pressor demand and subsequently proceed with weaning.  Hold in the ICU.  The patient is critically ill with multiple organ systems failure and requires high complexity decision making for assessment and support, frequent evaluation and titration of therapies, application of advanced monitoring technologies and extensive interpretation of multiple databases.   Critical Care Time devoted to patient care services described in this note is  35  Minutes. This time reflects time of care  of this signee Dr Jennet Maduro. This critical care time does not reflect procedure time, or teaching time or supervisory time of PA/NP/Med student/Med Resident etc but could involve care discussion time.  Rush Farmer, M.D. [facility] Pulmonary/Critical Care Medicine. Pager: [phone]. After hours pager: [phone].

## 2016-09-24 NOTE — Progress Notes (Signed)
Subjective: Titrated off of versed, now off since 2pm yesterday.   Exam: Vitals:   09/07/2016 0830 10/04/2016 0845  BP: (!) 90/54 (!) 93/54  Pulse: (!) 59 61  Resp: (!) 0 (!) 0  Temp:  97.7 F (36.5 C)   Gen: In bed, tracheostomy Resp: ventilated.  Abd: soft, nt  Neuro:  MS: does not respond, does not open eyes.  [id number] ereactive, doll's eye intact, corneals present bilaterally.  Motor: no movement to noxious stimuli Sensory:as above   Impression: 30 yo M with new onset super refratory generalized status epilepticus(NORSE). He has been covered for meningitis and ideally I would like to do a lumbar puncture, however reviewing his films he has infection from his sacral source extending up into L1, 2 and I don't think that it would be safe to put a needle through infected tissue to access the CSF space. Also, developing meningitis while on the broad coverage which would have covered the CNS even prior to an developing symptoms would be unusual.   Suspicion therefore has been geared more towards a toxic/metabolic issue and the option that has been foremost in my thinking is medication related given that he was on antibiotics which lower seizure threshold.  His mother reports multiple episodes of decreased mental status, obtundation, etc. with beta-lactam antibiotics in the past and I wonder if he is somewhat sensitive to this Neurotoxicity reaction.   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 I will however, start antiplatelet therapy.  He had recurrent seizures on 12/10 and therefore was re-suppressed. Versed was stopped yesterday. IT may take quite some time for him to wake up given how long the induced coma lasted.    There has been delay for cervical puncture due to ASA therapy, but discussed with IR, to have it done today.   Recommendations: 1) VPA level.  2) continue keppra  1500mg  BID 3) continue vimpat 200mg  BID 4) continue topamax 100mg  BID 5) begin weaning propofol and versed 6) continue asa 7) LDL 97, but don't want to add any further medications at the current time, will need statin eventually.  8) cervical puncture to assess for meningitis.   Roland Rack, MD [facility] [phone]  If 7pm- 7am, please page neurology on call as listed in [place].   9:48 AM

## 2016-09-25 ENCOUNTER — Inpatient Hospital Stay (HOSPITAL_COMMUNITY): Payer: Medicaid Other

## 2016-09-25 ENCOUNTER — Encounter (HOSPITAL_COMMUNITY): Payer: Self-pay | Admitting: Interventional Radiology

## 2016-09-25 DIAGNOSIS — B965 Pseudomonas (aeruginosa) (mallei) (pseudomallei) as the cause of diseases classified elsewhere: Secondary | ICD-10-CM

## 2016-09-25 DIAGNOSIS — I951 Orthostatic hypotension: Secondary | ICD-10-CM

## 2016-09-25 DIAGNOSIS — Z1624 Resistance to multiple antibiotics: Secondary | ICD-10-CM

## 2016-09-25 DIAGNOSIS — J95851 Ventilator associated pneumonia: Secondary | ICD-10-CM

## 2016-09-25 LAB — BASIC METABOLIC PANEL
Anion gap: 8 (ref 5–15)
BUN: 14 mg/dL (ref 6–20)
CALCIUM: 8.4 mg/dL — AB (ref 8.9–10.3)
CHLORIDE: 124 mmol/L — AB (ref 101–111)
CO2: 13 mmol/L — AB (ref 22–32)
CREATININE: 0.54 mg/dL — AB (ref 0.61–1.24)
GFR calc non Af Amer: >60 mL/min (ref >60)
GLUCOSE: 193 mg/dL — AB (ref 65–99)
Potassium: 2.8 mmol/L — ABNORMAL LOW (ref 3.5–5.1)
Sodium: 145 mmol/L (ref 135–145)

## 2016-09-25 LAB — GLUCOSE, CAPILLARY
GLUCOSE-CAPILLARY: 217 mg/dL — AB (ref 65–99)
GLUCOSE-CAPILLARY: 141 mg/dL — AB (ref 65–99)
GLUCOSE-CAPILLARY: 109 mg/dL — AB (ref 65–99)
GLUCOSE-CAPILLARY: 282 mg/dL — AB (ref 65–99)
Glucose-Capillary: 118 mg/dL — ABNORMAL HIGH (ref 65–99)
Glucose-Capillary: 133 mg/dL — ABNORMAL HIGH (ref 65–99)
Glucose-Capillary: 139 mg/dL — ABNORMAL HIGH (ref 65–99)
Glucose-Capillary: 164 mg/dL — ABNORMAL HIGH (ref 65–99)
Glucose-Capillary: 233 mg/dL — ABNORMAL HIGH (ref 65–99)

## 2016-09-25 LAB — CBC
HCT: 27 % — ABNORMAL LOW (ref 39.0–52.0)
Hemoglobin: 9 g/dL — ABNORMAL LOW (ref 13.0–17.0)
MCH: 29.7 pg (ref 26.0–34.0)
MCHC: 33.3 g/dL (ref 30.0–36.0)
MCV: 89.1 fL (ref 78.0–100.0)
PLATELETS: 247 10*3/uL (ref 150–400)
RBC: 3.03 MIL/uL — ABNORMAL LOW (ref 4.22–5.81)
RDW: 20.1 % — ABNORMAL HIGH (ref 11.5–15.5)
WBC: 21.4 10*3/uL — ABNORMAL HIGH (ref 4.0–10.5)

## 2016-09-25 LAB — TYPE AND SCREEN
ABO/RH(D): O POS
ANTIBODY SCREEN: NEGATIVE
Unit division: 0

## 2016-09-25 MED ORDER — POTASSIUM CHLORIDE 20 MEQ/15ML (10%) PO SOLN
40.0000 meq | Freq: Three times a day (TID) | ORAL | Status: AC
  Administered 2016-09-25 (×2): 40 meq
  Filled 2016-09-25 (×2): qty 30

## 2016-09-25 MED ORDER — POTASSIUM CHLORIDE 2 MEQ/ML IV SOLN
Freq: Once | INTRAVENOUS | Status: AC
  Administered 2016-09-25: 07:00:00 via INTRAVENOUS
  Filled 2016-09-25: qty 1000

## 2016-09-25 MED ORDER — ALBUMIN HUMAN 25 % IV SOLN
25.0000 g | Freq: Four times a day (QID) | INTRAVENOUS | Status: AC
  Administered 2016-09-25 (×3): 25 g via INTRAVENOUS
  Filled 2016-09-25 (×5): qty 50

## 2016-09-25 MED ORDER — FUROSEMIDE 10 MG/ML IJ SOLN
40.0000 mg | Freq: Four times a day (QID) | INTRAMUSCULAR | Status: AC
  Administered 2016-09-25 (×3): 40 mg via INTRAVENOUS
  Filled 2016-09-25 (×3): qty 4

## 2016-09-25 MED ORDER — VALPROATE SODIUM 500 MG/5ML IV SOLN
1500.0000 mg | Freq: Four times a day (QID) | INTRAVENOUS | Status: DC
  Administered 2016-09-25 – 2016-09-26 (×6): 1500 mg via INTRAVENOUS
  Filled 2016-09-25 (×8): qty 15

## 2016-09-25 NOTE — Progress Notes (Signed)
[facility] Progress Note Patient Name: Jason [name] DOB: [birth year] MRN: [id number]   Date of Service  09/25/2016  HPI/Events of Note  K 2.8  eICU Interventions  Ordered IV repletion        Josuha Fontanez 09/25/2016, 6:11 AM

## 2016-09-25 NOTE — Progress Notes (Signed)
Name: Jason [name] MRN: [id number] DOB: 07-Dec-1985    ADMISSION DATE:  08/12/2016 CONSULTATION DATE:  11/21  REFERRING MD :  Dr. Algis Liming ([facility])   CHIEF COMPLAINT:  Lethargy, vent mgmt   BRIEF PATIENT DESCRIPTION:   30 yo male developed altered mental status after receiving azactam in setting of chronic wound infections (sacrum/heels).  He has hx of C6 spine injury with quadriplegia, tracheostomy status.  SUBJECTIVE:  No events overnight, remains unresponsive, ?of recurrence of seizure overnight.  VITAL SIGNS: BP (!) 99/53   Pulse 85   Temp 99.7 F (37.6 C) (Oral)   Resp (!) 25   Ht 5\' 8"  (1.727 m)   Wt 55.3 kg (122 lb)   SpO2 100%   BMI 18.55 kg/m   INTAKE/OUTPUT: I/O last 3 completed shifts: In: 5297.5 [I.V.:3202.6; Blood:327.1; Other:320; [id number]; IV Piggyback:800] Out: 3800 [Urine:2725; Stool:1075]  PHYSICAL EXAMINATION: General: chronically ill appearing young male in NAD on vent HEENT: Tracheostomy clean, no drainage  Cardiovascular:  s1 s2 Regular rhythm Pulmonary: resps even non labored on vent, few scattered rhonchi  Abdomen: Soft. Normal bowel sounds. Non distended, colostomy, Peg clamped, TF on hold  Neurological: no response to verbal stimuli, pupils 3 mm =/R EXT:  RUE>LUE edema   CMP Latest Ref Rng & Units 09/25/2016 10/02/2016 09/23/2016  Glucose 65 - 99 mg/dL 193(H) 325(H) 93  BUN 6 - 20 mg/dL 14 13 15   Creatinine 0.61 - 1.24 mg/dL 0.54(L) 0.50(L) 0.45(L)  Sodium 135 - 145 mmol/L 145 144 146(H)  Potassium 3.5 - 5.1 mmol/L 2.8(L) 3.4(L) 3.2(L)  Chloride 101 - 111 mmol/L 124(H) 124(H) 124(H)  CO2 22 - 32 mmol/L 13(L) 15(L) 14(L)  Calcium 8.9 - 10.3 mg/dL 8.4(L) 8.2(L) 8.4(L)  Total Protein 6.5 - 8.1 g/dL - - -  Total Bilirubin 0.3 - 1.2 mg/dL - - -  Alkaline Phos 38 - 126 U/L - - -  AST 15 - 41 U/L - - -  ALT 17 - 63 U/L - - -    CBC Latest Ref Rng & Units 09/25/2016 09/21/2016 09/22/2016  WBC 4.0 - 10.5 K/uL 21.4(H) 16.8(H)  17.2(H)  Hemoglobin 13.0 - 17.0 g/dL 9.0(L) 7.4(L) 5.9(LL)  Hematocrit 39.0 - 52.0 % 27.0(L) 22.4(L) 18.0(L)  Platelets 150 - 400 K/uL 247 219 233    Imaging: Dg Chest Port 1 View  Result Date: 09/25/2016 CLINICAL DATA:  Respiratory failure.  Hypoxia. EXAM: PORTABLE CHEST 1 VIEW COMPARISON:  09/22/2016, 09/16/2016, 08/28/2016. FINDINGS: Tracheostomy tube noted in good anatomic position. Left PICC line noted with tip projected over superior vena cava. Heart size stable. Persist atelectasis right lower lobe. Partial clearing of bilateral pulmonary infiltrates. Small right pleural effusion. IMPRESSION: 1. Lines and tubes in stable position. 2.  Persistent right lower lobe atelectasis. 3. Partial clearing of bilateral pulmonary infiltrates. Small right pleural effusion again noted. Electronically Signed   By: Marcello Moores  Register   On: 09/25/2016 08:15   Dg Chest Port 1 View  Result Date: 10/07/2016 CLINICAL DATA:  Respiratory failure, tracheostomy patient. History of asthma, diabetes, chronic osteomyelitis EXAM: PORTABLE CHEST 1 VIEW COMPARISON:  Portable chest x-ray of September 20, 2016. FINDINGS: The lungs are reasonably well inflated. The interstitial markings in the left mid and lower lung are slightly more conspicuous today. On the right there are persistent coarse interstitial and alveolar opacities in the lower lung with partial obscuration of the hemidiaphragm. The heart and pulmonary vascularity are normal. The tracheostomy appliance tip projects at the inferior  margin of the clavicular heads. The PICC line tip projects over the distal third of the SVC. IMPRESSION: Persistent right middle and lower lobe infiltrate compatible with pneumonia. Interval worsening of the left mid and lower lung interstitium likely reflecting pneumonia as well. Electronically Signed   By: David  Martinique M.D.   On: 09/28/2016 07:47   SIGNIFICANT EVENTS: 11/22 - surgical bx sacral bone/wound 12/04 - transfer to ICU with  status epilepticus 12/07 - eeg remains c poor neurostatus 12/17 - No seizures overnight, versed drip at 30mg /hr, intermittent hypothermia, decreased PEG tube drainage 12/18 - cervical puncture >>  LINES: PICC LUE 12/15 >>   STUDIES:  EEG 12/06 >> burst suppression MRI brain 12/09 >> acute infarct Lt paracentral mid pons 3 mm, chronic Rt cerebellar infarct, atrophy  MICROBIOLOGY:  Blood Ctx x3 11/19:  3/3 Streptococcus constellatus  Urine Ctx 11/19:  Acinetobacter & Enterococcus faecalis MRSA PCR 11/20:  Positive Sacral Bone Ctx 11/22:  ESBL Kleb pna, Enterococcus faecalis, strep constellatus Blood Ctx x2 11/23:  Negative  Tracheal Asp Ctx 11/23:  Klebsiella pneumoniae & Acinetobacter Tracheal Asp Ctx 11/24:  Klebsiella pneumoniae & Acinetobacter Tracheal Asp Ctx 11/30:  Pseudomonas aeruginosa & Stenotrophomonas   ASSESSMENT / PLAN:  Status epilepticus >> possibly related to Abx vs meningitis  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 CSF negative for infection C6 spine injury with quadriplegia. - AEDs per neurology - EEG reordered by neuro, awaiting results - Versed off. - Monitor for further clinical seizure  Acute on chronic respiratory failure. Tracheostomy status. - Full vent support --> wean when MS allows. - Defer trach change for now. - F/u CXR PRN. - Budesonide BID. - Active diureses today.  HCAP, recurrent UTI, osteomyelitis, sacral/heel wounds. - Monitor off Abx - ID following - WOC for dressing recommendations - Still has Pseudomonas and Stenotrophomonas in sputum from 12/08 >> ?if this is chronic colonization. Will defer to ID  Non-anion gap Metabolic acidosis in setting of hyperchloremia  Hypomagenesemia  Hypokalemia - D/C D5LR @ 75 ml/hr - F/u cmp in am   - Replace electrolytes as indicated - Lasix 40 mg IV q6 x3 doses and albumin 25% 25 gm IV q6 x3 doses.  Anemia of critical illness:  f/u CBC after transfusion 12/11 12/12 6.2->8.1 - Trend CBC. - Transfuse  per ICU protocol.  Rt subclavian/brachial DVT 2nd to PICC line 11/27. 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 Chronic diastolic CHF. Hypotension 2nd to sedation. - Pressors to keep MAP > 55 - Midodrine - IJ changed to PICC 12/15 - Will need to decide on timing of systemic anticoagulation, will likely start in AM (had c-spine tap 12/18)  Severe protein calorie malnutrition. DM gastroparesis. - Resume tube feeds.  DM type 1. - SSI - KVO IVF  [place] - Mother updated at length twice on 12/18, very unrealistic expectations here.   The patient is critically ill with multiple organ systems failure and requires high complexity decision making for assessment and support, frequent evaluation and titration of therapies, application of advanced monitoring technologies and extensive interpretation of multiple databases.   Critical Care Time devoted to patient care services described in this note is  35  Minutes. This time reflects time of care of this signee Dr Jennet Maduro. This critical care time does not reflect procedure time, or teaching time or supervisory time of PA/NP/Med student/Med Resident etc but could involve care discussion time.  Wesam G.  Nelda Marseille, M.D. [facility] Pulmonary/Critical Care Medicine. Pager: [phone]. After hours pager: [phone].

## 2016-09-25 NOTE — Progress Notes (Addendum)
Nutrition Follow Up  DOCUMENTATION CODES:   Underweight  INTERVENTION:    Continue Vital AF 1.2 at goal rate of 65 ml/hr  TF regimen providing 1872 kcals, 117 gm protein, 1265 ml of free water  NUTRITION DIAGNOSIS:   Increased nutrient needs related to wound healing as evidenced by estimated needs, ongoing  GOAL:   Patient will meet greater than or equal to 90% of their needs, met  MONITOR:   TF tolerance, Vent status, Labs, Weight trends, Skin, I & O's  ASSESSMENT:   30 y.o. 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  Patient typically receives nocturnal TF with Glucerna 1.2 and eats regular foods during the day.  Patient is currently on ventilator support >> trach MV: 11.9 L/min Temp (24hrs), Avg:96.7 F (35.9 C), Min:94.3 F (34.6 C), Max:99.7 F (37.6 C)  Pt transferred to 2S-SICU 12/4 due to new onset refractory status epilepticus. S/p C1-C2 fluoro guided puncture for CSF per IR 12/18. Vital AF 1.2 formula currently infusing at goal rate of 65 ml/hr via PEG tube. Infectious Disease continues to follow. CBG's [phone].  Diet Order:  Diet NPO time specified Except for: Sips with Meds  Skin:  Wound (see comment) (Stg 2 R elbow, back; stg 2 L heel; stg 4 coccyx, buttocks)  Last BM:  12/18  Height:   Ht Readings from Last 1 Encounters:  09/22/16 _0  (1.727 m)    Weight: >>> fluctuating but stable  Wt Readings from Last 1 Encounters:  09/25/16 122 lb (55.3 kg)   12/18  112 lb 12/17  106 lb 12/16  111 lb 12/15  113 lb 12/14  113 lb 12/13  110 lb 12/12  108 lb 12/11  106 lb 12/10  109 lb 12/09  104 lb 12/08  102 lb  Ideal Body Weight:  63 kg (adjusted for  quadriplegia)  BMI:  Body mass index is 18.55 kg/m.  Re-estimated Nutritional Needs:   Kcal:  1173  Protein:  110-120 gm  Fluid:  per MD  EDUCATION NEEDS:   No education needs identified at this time   Arthur Holms, RD, LDN Pager #: [phone] After-Hours Pager #: [phone]

## 2016-09-25 NOTE — Progress Notes (Signed)
Noted downward fixed gaze during shift assessment.  Called Neurology.  Verbal order given to modify VIMPAT dose from 100mg  to 200mg ; starting at 2200.  Will continue to monitor.  Clyda Hurdle RN

## 2016-09-25 NOTE — Progress Notes (Signed)
vLTM EEG running. Pt has skin breakdown from previous long term eegs. Electrodes were placed around breakdown.

## 2016-09-25 NOTE — Progress Notes (Signed)
Potassium 2.8; with approximately 1500 UO and 800 stool and lasix 40mg  given twice during shift.  Called CCM.  Will order potassium replacement.  Clyda Hurdle RN

## 2016-09-25 NOTE — Progress Notes (Signed)
Subjective: Nurse noted some response during mouth care today.   Exam: Vitals:   09/25/16 0700 09/25/16 0722  BP:    Pulse: 85   Resp: (!) 25   Temp:  99.7 F (37.6 C)   Gen: In bed, tracheostomy Resp: ventilated.  Abd: soft, nt  Neuro:  MS: does not respond, does not open eyes.  [id number] reactive, doll's eye intact, corneals present bilaterally.   Motor: no movement to noxious stimuli Sensory:as above  Following stimulation, he does have a downward gaze deviation.   CSF 2 WBC Protein 60  Impression: 30 yo M with new onset super refratory generalized status epilepticus(NORSE). Due to infectino in lumbar area, Tap was delayed and cervical puncture was pursued wihohut evidence of infection.   Suspicion therefore has been geared more towards a toxic/metabolic issue and the option that has been foremost in my thinking is medication related given that he was on antibiotics which lower seizure threshold.  His mother reports multiple episodes of decreased mental status, obtundation, etc. with beta-lactam antibiotics in the past and I wonder if he is somewhat sensitive to this Neurotoxicity reaction.   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 I have started anti-platelet therapy.   He had recurrent seizures on 12/10 and therefore was re-suppressed. Versed was stopped 12/17. It may take quite some time for him to wake up given how long the induced coma lasted.    Cervical puncture without evidence of infection. Elevated protein of unclear significance as this can be seen in many disorders, including status epilepticus.   Recommendations: 1) increase VPA to 1500mg  Q6H 2) continue keppra 1500mg  BID 3) continue vimpat 200mg  BID 4) continue topamax 100mg  BID 5) will get CT head and restart EEG monitoring given spells of downward gaze.     Roland Rack, MD [facility] [phone]  If 7pm- 7am, please page neurology on call as listed in [place]. 09/25/2016  8:21 AM

## 2016-09-25 NOTE — Progress Notes (Signed)
Patient transported to CT and back to room 2S05 without complications. 

## 2016-09-25 NOTE — Progress Notes (Signed)
Subjective:  Patient is unresponsive   Antibiotics:  Anti-infectives    Start     Dose/Rate Route Frequency Ordered Stop   09/11/16 2200  ceFEPIme (MAXIPIME) 2 g in dextrose 5 % 50 mL IVPB  Status:  Discontinued     2 g 100 mL/hr over 30 Minutes Intravenous Every 8 hours 09/11/16 1830 09/11/16 1838   09/11/16 2200  ceFEPIme (MAXIPIME) 2 g in dextrose 5 % 50 mL IVPB  Status:  Discontinued     2 g 100 mL/hr over 30 Minutes Intravenous Every 8 hours 09/11/16 1838 09/15/16 1441   09/11/16 2000  ceFEPIme (MAXIPIME) 2 g in dextrose 5 % 50 mL IVPB  Status:  Discontinued     2 g 100 mL/hr over 30 Minutes Intravenous Every 8 hours 09/11/16 1813 09/11/16 1830   09/11/16 1400  meropenem (MERREM) 1 g in sodium chloride 0.9 % 100 mL IVPB  Status:  Discontinued     1 g 200 mL/hr over 30 Minutes Intravenous Every 8 hours 09/11/16 1150 09/11/16 1813   09/11/16 1400  sulfamethoxazole-trimethoprim (BACTRIM) 240 mg of trimethoprim in dextrose 5 % 250 mL IVPB  Status:  Discontinued     240 mg of trimethoprim 265 mL/hr over 60 Minutes Intravenous Every 8 hours 09/11/16 1156 09/15/16 1441   09/10/16 2100  levofloxacin (LEVAQUIN) IVPB 750 mg  Status:  Discontinued     750 mg 100 mL/hr over 90 Minutes Intravenous Every 24 hours 09/10/16 1849 09/11/16 1115   09/10/16 2000  ceFEPIme (MAXIPIME) 2 g in dextrose 5 % 50 mL IVPB  Status:  Discontinued     2 g 100 mL/hr over 30 Minutes Intravenous Every 8 hours 09/10/16 1847 09/11/16 1140   09/08/16 1700  imipenem-cilastatin (PRIMAXIN) 1,000 mg in sodium chloride 0.9 % 250 mL IVPB  Status:  Discontinued     1,000 mg 250 mL/hr over 60 Minutes Intravenous Every 8 hours 09/08/16 1159 09/10/16 1736   09/01/16 1300  anidulafungin (ERAXIS) 100 mg in sodium chloride 0.9 % 100 mL IVPB  Status:  Discontinued     100 mg over 90 Minutes Intravenous Every 24 hours 08/31/16 1240 09/02/16 1252   09/01/16 1200  vancomycin (VANCOCIN) IVPB 750 mg/150 ml premix   Status:  Discontinued     750 mg 150 mL/hr over 60 Minutes Intravenous Every 24 hours 09/01/16 1145 09/01/16 1712   08/31/16 1700  imipenem-cilastatin (PRIMAXIN) 500 mg in sodium chloride 0.9 % 100 mL IVPB  Status:  Discontinued     500 mg 200 mL/hr over 30 Minutes Intravenous Every 8 hours 08/31/16 1240 09/08/16 1159   08/31/16 1300  anidulafungin (ERAXIS) 200 mg in sodium chloride 0.9 % 200 mL IVPB     200 mg over 180 Minutes Intravenous  Once 08/31/16 1240 08/31/16 1810   08/31/2016 0557  tigecycline (TYGACIL) 50 mg in sodium chloride 0.9 % 100 mL IVPB  Status:  Discontinued     50 mg 200 mL/hr over 30 Minutes Intravenous Every 12 hours 08/28/16 1758 08/31/16 1151   08/28/16 1800  tigecycline (TYGACIL) 100 mg in sodium chloride 0.9 % 100 mL IVPB  Status:  Discontinued     100 mg 200 mL/hr over 30 Minutes Intravenous  Once 08/28/16 1758 08/31/16 1424   08/28/16 1300  linezolid (ZYVOX) IVPB 600 mg  Status:  Discontinued     600 mg 300 mL/hr over 60 Minutes Intravenous Every 12 hours 08/28/16 1239 08/31/16 2014  08/28/16 0200  aztreonam (AZACTAM) 1 g in dextrose 5 % 50 mL IVPB  Status:  Discontinued     1 g 100 mL/hr over 30 Minutes Intravenous Every 8 hours 08/27/16 1802 08/28/16 1232   08/27/16 2000  vancomycin (VANCOCIN) 500 mg in sodium chloride 0.9 % 100 mL IVPB  Status:  Discontinued     500 mg 100 mL/hr over 60 Minutes Intravenous Every 12 hours 08/27/16 1802 08/28/16 1239   08/27/16 1800  aztreonam (AZACTAM) 2 g in dextrose 5 % 50 mL IVPB     2 g 100 mL/hr over 30 Minutes Intravenous  Once 08/27/16 1634 08/27/16 1852   08/27/16 1600  levofloxacin (LEVAQUIN) IVPB 750 mg  Status:  Discontinued     750 mg 100 mL/hr over 90 Minutes Intravenous Every 24 hours 09/04/2016 1324 08/15/2016 1522   08/27/16 0000  vancomycin (VANCOCIN) 500 mg in sodium chloride 0.9 % 100 mL IVPB  Status:  Discontinued     500 mg 100 mL/hr over 60 Minutes Intravenous Every 12 hours 08/21/2016 1324 08/29/2016 1522     08/08/2016 2200  aztreonam (AZACTAM) 1 g in dextrose 5 % 50 mL IVPB  Status:  Discontinued     1 g 100 mL/hr over 30 Minutes Intravenous Every 8 hours 08/23/2016 1324 08/31/2016 1522   09/02/2016 1600  linezolid (ZYVOX) tablet 600 mg  Status:  Discontinued     600 mg Oral Every 12 hours 08/22/2016 1458 08/27/16 1347   08/31/2016 1300  levofloxacin (LEVAQUIN) IVPB 750 mg  Status:  Discontinued     750 mg 100 mL/hr over 90 Minutes Intravenous  Once 08/31/2016 1246 08/19/2016 1522   08/27/2016 1300  aztreonam (AZACTAM) 2 g in dextrose 5 % 50 mL IVPB  Status:  Discontinued     2 g 100 mL/hr over 30 Minutes Intravenous  Once 08/29/2016 1246 09/03/2016 1522   08/14/2016 1300  vancomycin (VANCOCIN) IVPB 1000 mg/200 mL premix  Status:  Discontinued     1,000 mg 200 mL/hr over 60 Minutes Intravenous  Once 08/09/2016 1246 08/12/2016 1522      Medications: Scheduled Meds: . albumin human  25 g Intravenous Q6H  . albuterol  2.5 mg Nebulization QID  . artificial tears   Both Eyes Q8H  . baclofen  10 mg Oral QID  . budesonide (PULMICORT) nebulizer solution  0.5 mg Nebulization BID  . chlorhexidine gluconate (MEDLINE KIT)  15 mL Mouth Rinse BID  . furosemide  40 mg Intravenous Q6H  . insulin aspart  0-15 Units Subcutaneous Q4H  . insulin glargine  5 Units Subcutaneous QHS  . lacosamide (VIMPAT) IV  200 mg Intravenous Q12H  . levETIRAcetam  1,500 mg Intravenous Q12H  . mouth rinse  15 mL Mouth Rinse 10 times per day  . midazolam  40 mg Intravenous Once  . midodrine  20 mg Per Tube TID WC  . oxybutynin  5 mg Per Tube BID  . pantoprazole sodium  40 mg Per Tube Daily  . potassium chloride  40 mEq Per Tube TID  . sodium chloride flush  10-40 mL Intracatheter Q12H  . topiramate  100 mg Oral BID  . valproate sodium  1,500 mg Intravenous Q6H   Continuous Infusions: . feeding supplement (VITAL AF 1.2 CAL) 1,000 mL (09/25/16 1100)  . midazolam (VERSED) infusion Stopped (09/23/16 1400)  . phenylephrine (NEO-SYNEPHRINE)  Adult infusion 40 mcg/min (09/25/16 1111)   PRN Meds:.acetaminophen (TYLENOL) oral liquid 160 mg/5 mL, fentaNYL (SUBLIMAZE) injection, LORazepam,  metoprolol, naLOXone (NARCAN)  injection, sodium chloride flush    Objective: Weight change: 10 lb (4.536 kg)  Intake/Output Summary (Last 24 hours) at 09/25/16 1214 Last data filed at 09/25/16 1100  Gross per 24 hour  Intake          4681.57 ml  Output           3250.3 ml  Net          1431.27 ml   Blood pressure 97/61, pulse (!) 103, temperature 99.7 F (37.6 C), temperature source Oral, resp. rate (!) 27, height '5\' 8"'$  (1.727 m), weight 122 lb (55.3 kg), SpO2 100 %. Temp:  [94.3 F (34.6 C)-99.7 F (37.6 C)] 99.7 F (37.6 C) (12/19 0722) Pulse Rate:  [58-105] 103 (12/19 1127) Resp:  [13-39] 27 (12/19 1127) BP: (52-122)/(33-76) 97/61 (12/19 1127) SpO2:  [99 %-100 %] 100 % (12/19 1127) FiO2 (%):  [30 %] 30 % (12/19 1127) Weight:  [122 lb (55.3 kg)] 122 lb (55.3 kg) (12/19 0300)  Physical Exam: General: Unresponsive,  HEENT: anicteric sclera, pupils edematous fairly fixed , face is more edematous, Positive doll's eye CVS regular rate, normal r,  no murmur rubs or gallops Chest: clear to auscultation bilaterally, no wheezing, rales or rhonchi Abdomen: soft nondistended, normal bowel sounds, Extremities:skin: decubitus ulcers not examined today.  Neuro: No new focal deficits  CBC:  CBC Latest Ref Rng & Units 09/25/2016 09/28/2016 09/15/2016  WBC 4.0 - 10.5 K/uL 21.4(H) 16.8(H) 17.2(H)  Hemoglobin 13.0 - 17.0 g/dL 9.0(L) 7.4(L) 5.9(LL)  Hematocrit 39.0 - 52.0 % 27.0(L) 22.4(L) 18.0(L)  Platelets 150 - 400 K/uL 247 219 233     BMET  Recent Labs  09/23/2016 0400 09/25/16 0500  NA 144 145  K 3.4* 2.8*  CL 124* 124*  CO2 15* 13*  GLUCOSE 325* 193*  BUN 13 14  CREATININE 0.50* 0.54*  CALCIUM 8.2* 8.4*     Liver Panel  No results for input(s): PROT, ALBUMIN, AST, ALT, ALKPHOS, BILITOT, BILIDIR, IBILI in the last 72  hours.     Sedimentation Rate No results for input(s): ESRSEDRATE in the last 72 hours. C-Reactive Protein No results for input(s): CRP in the last 72 hours.  Micro Results: Recent Results (from the past 720 hour(s))  Culture, blood (x 2)     Status: Abnormal   Collection Time: 08/15/2016  6:10 PM  Result Value Ref Range Status   Specimen Description BLOOD RIGHT HAND  Final   Special Requests IN PEDIATRIC BOTTLE 2CC  Final   Culture  Setup Time   Final    GRAM POSITIVE COCCI IN PAIRS IN CHAINS IN PEDIATRIC BOTTLE CRITICAL RESULT CALLED TO, READ BACK BY AND VERIFIED WITH: VERONDA BRYK,PHARMD '@0206'$  08/16/2016 MKELLY,MLT    Culture (A)  Final    STREPTOCOCCUS CONSTELLATUS SUSCEPTIBILITIES PERFORMED ON PREVIOUS CULTURE WITHIN THE LAST 5 DAYS.    Report Status 08/31/2016 FINAL  Final  Culture, blood (x 2)     Status: Abnormal   Collection Time: 08/19/2016  6:14 PM  Result Value Ref Range Status   Specimen Description BLOOD RIGHT THUMB  Final   Special Requests IN PEDIATRIC BOTTLE 1CC  Final   Culture  Setup Time   Final    GRAM POSITIVE COCCI IN PAIRS IN PEDIATRIC BOTTLE CRITICAL VALUE NOTED.  VALUE IS CONSISTENT WITH PREVIOUSLY REPORTED AND CALLED VALUE.    Culture (A)  Final    STREPTOCOCCUS CONSTELLATUS SUSCEPTIBILITIES PERFORMED ON PREVIOUS CULTURE WITHIN THE LAST 5  DAYS.    Report Status 08/31/2016 FINAL  Final  MRSA PCR Screening     Status: Abnormal   Collection Time: 08/27/16  3:50 AM  Result Value Ref Range Status   MRSA by PCR POSITIVE (A) NEGATIVE Final    Comment:        The GeneXpert MRSA Assay (FDA approved for NASAL specimens only), is one component of a comprehensive MRSA colonization surveillance program. It is not intended to diagnose MRSA infection nor to guide or monitor treatment for MRSA infections. RESULT CALLED TO, READ BACK BY AND VERIFIED WITH: EVERETTE,D RN 08/27/16 AT 0515 SKEEN,P   Aerobic/Anaerobic Culture (surgical/deep wound)     Status:  None   Collection Time: 08/28/2016  4:01 PM  Result Value Ref Range Status   Specimen Description TISSUE BONE  Final   Special Requests SACRAL  Final   Gram Stain   Final    ABUNDANT WBC PRESENT, PREDOMINANTLY PMN ABUNDANT GRAM NEGATIVE RODS ABUNDANT GRAM POSITIVE COCCI IN PAIRS FEW GRAM POSITIVE RODS    Culture   Final    MODERATE KLEBSIELLA PNEUMONIAE Confirmed Extended Spectrum Beta-Lactamase Producer (ESBL) MODERATE ENTEROCOCCUS FAECALIS MODERATE STREPTOCOCCUS CONSTELLATUS MIXED ANAEROBIC FLORA PRESENT.  CALL LAB IF FURTHER IID REQUIRED.    Report Status 09/03/2016 FINAL  Final   Organism ID, Bacteria KLEBSIELLA PNEUMONIAE  Final   Organism ID, Bacteria ENTEROCOCCUS FAECALIS  Final      Susceptibility   Enterococcus faecalis - MIC*    AMPICILLIN <=2 SENSITIVE Sensitive     VANCOMYCIN 1 SENSITIVE Sensitive     GENTAMICIN SYNERGY SENSITIVE Sensitive     * MODERATE ENTEROCOCCUS FAECALIS   Klebsiella pneumoniae - MIC*    AMPICILLIN >=32 RESISTANT Resistant     CEFAZOLIN >=64 RESISTANT Resistant     CEFEPIME >=64 RESISTANT Resistant     CEFTAZIDIME >=64 RESISTANT Resistant     CEFTRIAXONE >=64 RESISTANT Resistant     CIPROFLOXACIN 2 INTERMEDIATE Intermediate     GENTAMICIN >=16 RESISTANT Resistant     IMIPENEM <=0.25 SENSITIVE Sensitive     TRIMETH/SULFA >=320 RESISTANT Resistant     AMPICILLIN/SULBACTAM >=32 RESISTANT Resistant     PIP/TAZO >=128 RESISTANT Resistant     Extended ESBL POSITIVE Resistant     * MODERATE KLEBSIELLA PNEUMONIAE  Culture, blood (Routine X 2) w Reflex to ID Panel     Status: None   Collection Time: 08/30/16  9:30 AM  Result Value Ref Range Status   Specimen Description BLOOD LEFT ANTECUBITAL  Final   Special Requests BOTTLES DRAWN AEROBIC ONLY 5CC  Final   Culture NO GROWTH 5 DAYS  Final   Report Status 09/04/2016 FINAL  Final  Culture, blood (Routine X 2) w Reflex to ID Panel     Status: None   Collection Time: 08/30/16  9:35 AM  Result Value  Ref Range Status   Specimen Description BLOOD BLOOD LEFT ARM  Final   Special Requests BOTTLES DRAWN AEROBIC ONLY 5CC  Final   Culture NO GROWTH 5 DAYS  Final   Report Status 09/04/2016 FINAL  Final  Culture, respiratory (NON-Expectorated)     Status: None   Collection Time: 08/30/16  3:56 PM  Result Value Ref Range Status   Specimen Description TRACHEAL ASPIRATE  Final   Special Requests NONE  Final   Gram Stain   Final    ABUNDANT WBC PRESENT,BOTH PMN AND MONONUCLEAR MODERATE GRAM POSITIVE COCCI IN PAIRS FEW GRAM VARIABLE ROD FEW YEAST  Culture   Final    MODERATE KLEBSIELLA PNEUMONIAE MODERATE ACINETOBACTER CALCOACETICUS/BAUMANNII COMPLEX Confirmed Extended Spectrum Beta-Lactamase Producer (ESBL) for KLEBSIELLA PNEUMONIAE    Report Status 09/02/2016 FINAL  Final   Organism ID, Bacteria KLEBSIELLA PNEUMONIAE  Final   Organism ID, Bacteria ACINETOBACTER CALCOACETICUS/BAUMANNII COMPLEX  Final      Susceptibility   Acinetobacter calcoaceticus/baumannii complex - MIC*    CEFTAZIDIME >=64 RESISTANT Resistant     CEFTRIAXONE >=64 RESISTANT Resistant     CIPROFLOXACIN >=4 RESISTANT Resistant     GENTAMICIN 4 SENSITIVE Sensitive     IMIPENEM 1 SENSITIVE Sensitive     PIP/TAZO >=128 RESISTANT Resistant     TRIMETH/SULFA >=320 RESISTANT Resistant     CEFEPIME >=64 RESISTANT Resistant     AMPICILLIN/SULBACTAM 4 SENSITIVE Sensitive     * MODERATE ACINETOBACTER CALCOACETICUS/BAUMANNII COMPLEX   Klebsiella pneumoniae - MIC*    AMPICILLIN >=32 RESISTANT Resistant     CEFAZOLIN >=64 RESISTANT Resistant     CEFEPIME >=64 RESISTANT Resistant     CEFTAZIDIME 16 RESISTANT Resistant     CEFTRIAXONE >=64 RESISTANT Resistant     CIPROFLOXACIN 1 SENSITIVE Sensitive     GENTAMICIN >=16 RESISTANT Resistant     IMIPENEM <=0.25 SENSITIVE Sensitive     TRIMETH/SULFA >=320 RESISTANT Resistant     AMPICILLIN/SULBACTAM >=32 RESISTANT Resistant     PIP/TAZO 8 SENSITIVE Sensitive     Extended ESBL  POSITIVE Resistant     * MODERATE KLEBSIELLA PNEUMONIAE  Fungus Culture With Stain     Status: None   Collection Time: 08/30/16  3:56 PM  Result Value Ref Range Status   Fungus Stain Final report  Final   Fungus (Mycology) Culture Preliminary report  Final    Comment: (NOTE) Performed At: [facility] [place], [place] [id number] Lindon Romp MD WU:[id number]    Fungal Source TRACHEAL ASPIRATE  Final  Fungus Culture Result     Status: None   Collection Time: 08/30/16  3:56 PM  Result Value Ref Range Status   Result 1 Yeast observed  Final    Comment: (NOTE) Performed At: [facility] [address] [place], [place] [id number] Lindon Romp MD VH:[id number]   Fungal organism reflex     Status: None   Collection Time: 08/30/16  3:56 PM  Result Value Ref Range Status   Fungal result 1 Candida albicans  Final    Comment: (NOTE) Light growth Performed At: [facility] [address] [place], [place] [id number] Lindon Romp MD UV:[id number]   Culture, respiratory (NON-Expectorated)     Status: None   Collection Time: 08/31/16  1:20 AM  Result Value Ref Range Status   Specimen Description TRACHEAL ASPIRATE  Final   Special Requests NONE  Final   Gram Stain   Final    ABUNDANT WBC PRESENT,BOTH PMN AND MONONUCLEAR NO ORGANISMS SEEN    Culture   Final    MODERATE ACINETOBACTER CALCOACETICUS/BAUMANNII COMPLEX SUSCEPTIBILITIES PERFORMED ON PREVIOUS CULTURE WITHIN THE LAST 5 DAYS.    Report Status 09/04/2016 FINAL  Final  Culture, respiratory (NON-Expectorated)     Status: None   Collection Time: 09/06/16 10:36 PM  Result Value Ref Range Status   Specimen Description TRACHEAL ASPIRATE  Final   Special Requests Normal  Final   Gram Stain   Final    MODERATE WBC PRESENT,BOTH PMN AND MONONUCLEAR FEW GRAM NEGATIVE RODS    Culture   Final    ABUNDANT PSEUDOMONAS AERUGINOSA ABUNDANT  STENOTROPHOMONAS MALTOPHILIA THE PSEUDOMONAS  AERUGINOSA IS A  MULTI-DRUG RESISTANT ORGANISM Results Called to: RN N.[place]  [id number] MLM    Report Status 09/10/2016 FINAL  Final   Organism ID, Bacteria STENOTROPHOMONAS MALTOPHILIA  Final   Organism ID, Bacteria PSEUDOMONAS AERUGINOSA  Final      Susceptibility   Pseudomonas aeruginosa - MIC*    CEFTAZIDIME 8 SENSITIVE Sensitive     CIPROFLOXACIN >=4 RESISTANT Resistant     GENTAMICIN >=16 RESISTANT Resistant     IMIPENEM >=16 RESISTANT Resistant     PIP/TAZO >=128 RESISTANT Resistant     CEFEPIME 8 SENSITIVE Sensitive     * ABUNDANT PSEUDOMONAS AERUGINOSA   Stenotrophomonas maltophilia - MIC*    LEVOFLOXACIN 1 SENSITIVE Sensitive     TRIMETH/SULFA <=20 SENSITIVE Sensitive     * ABUNDANT STENOTROPHOMONAS MALTOPHILIA  Culture, respiratory (NON-Expectorated)     Status: None   Collection Time: 09/14/16  1:19 PM  Result Value Ref Range Status   Specimen Description TRACHEAL ASPIRATE  Final   Special Requests NONE  Final   Gram Stain   Final    ABUNDANT WBC PRESENT, PREDOMINANTLY PMN RARE GRAM NEGATIVE RODS MODERATE YEAST    Culture   Final    ABUNDANT PSEUDOMONAS AERUGINOSA MODERATE STENOTROPHOMONAS MALTOPHILIA PSEUDOMONAS AERUGINOSA MULTI-DRUG RESISTANT ORGANISM Results Called to: Karlene Einstein PHARMD, AT 0820 09/11/2016 BY D. VANHOOK    Report Status  FINAL  Final   Organism ID, Bacteria STENOTROPHOMONAS MALTOPHILIA  Final   Organism ID, Bacteria PSEUDOMONAS AERUGINOSA  Final      Susceptibility   Pseudomonas aeruginosa - MIC*    CEFTAZIDIME 8 SENSITIVE Sensitive     CIPROFLOXACIN >=4 RESISTANT Resistant     GENTAMICIN >=16 RESISTANT Resistant     IMIPENEM >=16 RESISTANT Resistant     CEFEPIME 16 INTERMEDIATE Intermediate     * ABUNDANT PSEUDOMONAS AERUGINOSA   Stenotrophomonas maltophilia - MIC*    LEVOFLOXACIN 1 SENSITIVE Sensitive     TRIMETH/SULFA <=20 SENSITIVE Sensitive     * MODERATE STENOTROPHOMONAS MALTOPHILIA  CSF culture with Stat gram  stain     Status: None (Preliminary result)   Collection Time: 10/02/2016 12:45 PM  Result Value Ref Range Status   Specimen Description CSF  Final   Special Requests NONE  Final   Gram Stain   Final    WBC PRESENT,BOTH PMN AND MONONUCLEAR NO ORGANISMS SEEN CYTOSPIN SMEAR    Culture NO GROWTH < 24 HOURS  Final   Report Status PENDING  Incomplete    Studies/Results: Ct Head Wo Contrast  Result Date: 09/25/2016 CLINICAL DATA:  Gaze palsy.  Seizure like activity. EXAM: CT HEAD WITHOUT CONTRAST TECHNIQUE: Contiguous axial images were obtained from the base of the skull through the vertex without intravenous contrast. 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 The punctate acute pontine infarct on MRI is also not apparent on CT. There is no evidence of acute cortical infarct, intracranial hemorrhage, mass, midline shift, or extra-axial fluid collection. The ventricles and sulci are normal. Vascular: No hyperdense vessel or unexpected calcification. Skull: No fracture or osseous lesion. Sinuses/Orbits: Mild paranasal sinus mucosal thickening and small mastoid effusions. Unremarkable orbits. Other: None. IMPRESSION: No evidence of acute intracranial abnormality. Punctate pontine infarct on MRI is not apparent on CT. Electronically Signed   By: Logan Bores M.D.   On: 09/25/2016 10:56   Ir Fluoro Guide Ndl Plmt / Bx  Result Date: 09/25/2016 INDICATION: Change in mental status.  Seizures.  High white count. EXAM: IR FLUORO GUIDE NEEDLE PLACEMENT /BIOPSY MEDICATIONS: As per general anesthesia. ANESTHESIA/SEDATION: General anesthesia. FLUOROSCOPY TIME:  Fluoroscopy Time: 33.9 minutes seconds (1163 mGy). COMPLICATIONS: None immediate. PROCEDURE: Informed written consent was obtained from the patient after a thorough discussion of the procedural risks, benefits and alternatives. All questions were addressed. Maximal Sterile  Barrier Technique was utilized including caps, mask, sterile gowns, sterile gloves, sterile drape, hand hygiene and skin antiseptic. A timeout was performed prior to the initiation of the procedure. The patient was laid prone on the fluoroscopic table. The skin overlying the craniovertebral junction was then prepped and draped in the usual sterile fashion. Under biplane intermittent fluoroscopy, via the left sided approach, a 22 gauge spinal needle was advanced under intermittent fluoroscopy into the posterior arachnoid space at the level of C1-C2. Free egress of clear CSF was obtained. Approximately 8 cc of a clear CSF of was then obtained in separate vials and sent for microbiologic analysis. The needle was then removed. Hemostasis was achieved at the skin entry site. Initial attempts were made from the right side again using a 22 gauge spinal needle but were unsuccessful in gaining entry into the posterior subarachnoid space. IMPRESSION: Status post fluoroscopic guided needle placement in the subarachnoid space at C1-C2 posteriorly as described above with the retrieval of approximately 8 cc of clear CSF. Dr Estanislado Pandy and Dr Jola Baptist. Electronically Signed   By: Luanne Bras M.D.   On: 09/10/2016 13:08   Dg Chest Port 1 View  Result Date: 09/25/2016 CLINICAL DATA:  Respiratory failure.  Hypoxia. EXAM: PORTABLE CHEST 1 VIEW COMPARISON:  09/22/2016, 09/29/2016, 08/28/2016. FINDINGS: Tracheostomy tube noted in good anatomic position. Left PICC line noted with tip projected over superior vena cava. Heart size stable. Persist atelectasis right lower lobe. Partial clearing of bilateral pulmonary infiltrates. Small right pleural effusion. IMPRESSION: 1. Lines and tubes in stable position. 2.  Persistent right lower lobe atelectasis. 3. Partial clearing of bilateral pulmonary infiltrates. Small right pleural effusion again noted. Electronically Signed   By: Marcello Moores  Register   On: 09/25/2016 08:15   Dg Chest  Port 1 View  Result Date: 09/08/2016 CLINICAL DATA:  Respiratory failure, tracheostomy patient. History of asthma, diabetes, chronic osteomyelitis EXAM: PORTABLE CHEST 1 VIEW COMPARISON:  Portable chest x-ray of September 20, 2016. FINDINGS: The lungs are reasonably well inflated. The interstitial markings in the left mid and lower lung are slightly more conspicuous today. On the right there are persistent coarse interstitial and alveolar opacities in the lower lung with partial obscuration of the hemidiaphragm. The heart and pulmonary vascularity are normal. The tracheostomy appliance tip projects at the inferior margin of the clavicular heads. The PICC line tip projects over the distal third of the SVC. IMPRESSION: Persistent right middle and lower lobe infiltrate compatible with pneumonia. Interval worsening of the left mid and lower lung interstitium likely reflecting pneumonia as well. Electronically Signed   By: David  Martinique M.D.   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 ([place])   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(  [place])   History of pulmonary embolism   DVT (deep venous thrombosis) ([name])   Severe protein-calorie malnutrition ([place])   Dysphagia, pharyngoesophageal phase   Decubitus ulcer of right perineal ischial region, stage 4 ([name])   Decubitus ulcer of left perineal ischial region, stage 4 ([name])   Sacral decubitus ulcer, stage IV ([name])   Multiple allergies   Recurrent UTI   Respiratory failure, chronic ([name])   Aspiration pneumonia of right lower lobe ([name])   Seizures ([name])   Anxiety state   Anemia of chronic disease   Hyponatremia    Encephalopathy   Other acute osteomyelitis, multiple sites ([place])   Lower GI bleed   Viridans streptococci infection   Coma of unknown cause ([name])   Septic shock ([name])   Anaphylactic syndrome   Acute hypoxemic respiratory failure ([name])   Enterococcal infection   Thrombocytopenia ([name])   Status epilepticus ([name])   Chronic respiratory failure with hypoxia ([name])   Acute respiratory acidosis    Jason [name] is a 30 y.o. 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 He does not have evidence of meningitis at present and we will not give antibiotics to treat for meningitis.  I would continue supportive therapy  The last time I took care of him he did respond dramatically to high-dose "stress dose corticosteroids" though this was in the setting of shock where we were covering for possible anaphylactic reaction  Therefore I would not be against this though obviously would increase risk of infection I don't think it present that he has a clear-cut fulminant faction such as a bacteremia rather more smoldering infection such as his decubitus ulcers and sacral osteomyelitis.    He Does have infiltrates on chest x-ray, and tracheal aspirate has grown a Stenotrophomonas maltophilia and a multidrug resistant Pseudomonas. I'm very reluctant to treat him with antimicrobials particular beta lactams given his recent events and risk of worsening the picture  #2 Possible VAP:  I would hold off on giving him  antimicrobial therapy at this point in time his pseudomonal isolate is resistant to pretty much everything except ceftaz, and I am not anxious to start a beta lactam unless forced to at this point.   #3 Pelvic osteo with ESBL: holding off on therapy at present   LOS: 30 days   [name] 09/25/2016, 12:14 PM

## 2016-09-26 ENCOUNTER — Inpatient Hospital Stay (HOSPITAL_COMMUNITY): Payer: Medicaid Other

## 2016-09-26 LAB — GLUCOSE, CAPILLARY
GLUCOSE-CAPILLARY: 124 mg/dL — AB (ref 65–99)
GLUCOSE-CAPILLARY: 163 mg/dL — AB (ref 65–99)
GLUCOSE-CAPILLARY: 165 mg/dL — AB (ref 65–99)
GLUCOSE-CAPILLARY: 222 mg/dL — AB (ref 65–99)
Glucose-Capillary: 63 mg/dL — ABNORMAL LOW (ref 65–99)
Glucose-Capillary: 187 mg/dL — ABNORMAL HIGH (ref 65–99)
Glucose-Capillary: 157 mg/dL — ABNORMAL HIGH (ref 65–99)

## 2016-09-26 LAB — BASIC METABOLIC PANEL
Anion gap: 4 — ABNORMAL LOW (ref 5–15)
BUN: 16 mg/dL (ref 6–20)
CHLORIDE: 128 mmol/L — AB (ref 101–111)
CO2: 15 mmol/L — ABNORMAL LOW (ref 22–32)
Calcium: 9 mg/dL (ref 8.9–10.3)
Creatinine, Ser: 0.48 mg/dL — ABNORMAL LOW (ref 0.61–1.24)
GFR calc Af Amer: >60 mL/min (ref >60)
GLUCOSE: 193 mg/dL — AB (ref 65–99)
POTASSIUM: 4.2 mmol/L (ref 3.5–5.1)
Sodium: 147 mmol/L — ABNORMAL HIGH (ref 135–145)

## 2016-09-26 LAB — CBC
HCT: 24.3 % — ABNORMAL LOW (ref 39.0–52.0)
HEMOGLOBIN: 7.9 g/dL — AB (ref 13.0–17.0)
MCH: 30.2 pg (ref 26.0–34.0)
MCHC: 32.5 g/dL (ref 30.0–36.0)
MCV: 92.7 fL (ref 78.0–100.0)
PLATELETS: 175 10*3/uL (ref 150–400)
RBC: 2.62 MIL/uL — AB (ref 4.22–5.81)
RDW: 21.1 % — ABNORMAL HIGH (ref 11.5–15.5)
WBC: 17.6 10*3/uL — AB (ref 4.0–10.5)

## 2016-09-26 LAB — HERPES SIMPLEX VIRUS(HSV) DNA BY PCR
HSV 1 DNA: NEGATIVE
HSV 2 DNA: NEGATIVE

## 2016-09-26 LAB — VALPROIC ACID LEVEL: Valproic Acid Lvl: 41 ug/mL — ABNORMAL LOW (ref 50.0–100.0)

## 2016-09-26 LAB — MAGNESIUM: MAGNESIUM: 1.7 mg/dL (ref 1.7–2.4)

## 2016-09-26 LAB — PHOSPHORUS: Phosphorus: 3.9 mg/dL (ref 2.5–4.6)

## 2016-09-26 LAB — HEPARIN LEVEL (UNFRACTIONATED)

## 2016-09-26 MED ORDER — HEPARIN (PORCINE) IN NACL 100-0.45 UNIT/ML-% IJ SOLN
1450.0000 [IU]/h | INTRAMUSCULAR | Status: DC
  Administered 2016-09-26: 800 [IU]/h via INTRAVENOUS
  Administered 2016-09-27: 1000 [IU]/h via INTRAVENOUS
  Administered 2016-09-28: 1150 [IU]/h via INTRAVENOUS
  Administered 2016-09-29: 1450 [IU]/h via INTRAVENOUS
  Filled 2016-09-26 (×4): qty 250

## 2016-09-26 MED ORDER — VALPROATE SODIUM 500 MG/5ML IV SOLN
1000.0000 mg | INTRAVENOUS | Status: DC
Start: 2016-09-26 — End: 2016-10-06
  Administered 2016-09-26 – 2016-10-05 (×54): 1000 mg via INTRAVENOUS
  Filled 2016-09-26 (×59): qty 10

## 2016-09-26 MED ORDER — MAGNESIUM SULFATE 2 GM/50ML IV SOLN
2.0000 g | Freq: Once | INTRAVENOUS | Status: AC
  Administered 2016-09-26: 2 g via INTRAVENOUS
  Filled 2016-09-26 (×2): qty 50

## 2016-09-26 MED ORDER — DEXTROSE 50 % IV SOLN
INTRAVENOUS | Status: AC
  Administered 2016-09-26: 50 mL
  Filled 2016-09-26: qty 50

## 2016-09-26 MED ORDER — HEPARIN BOLUS VIA INFUSION
1000.0000 [IU] | Freq: Once | INTRAVENOUS | Status: AC
  Administered 2016-09-26: 1000 [IU] via INTRAVENOUS
  Filled 2016-09-26: qty 1000

## 2016-09-26 MED ORDER — ALBUMIN HUMAN 25 % IV SOLN
25.0000 g | Freq: Four times a day (QID) | INTRAVENOUS | Status: AC
  Administered 2016-09-26 (×3): 25 g via INTRAVENOUS
  Filled 2016-09-26: qty 50
  Filled 2016-09-26: qty 100
  Filled 2016-09-26: qty 50

## 2016-09-26 MED ORDER — FUROSEMIDE 10 MG/ML IJ SOLN
40.0000 mg | Freq: Four times a day (QID) | INTRAMUSCULAR | Status: AC
  Administered 2016-09-26 (×3): 40 mg via INTRAVENOUS
  Filled 2016-09-26 (×3): qty 4

## 2016-09-26 MED ORDER — ASPIRIN 81 MG PO CHEW
324.0000 mg | CHEWABLE_TABLET | Freq: Every day | ORAL | Status: DC
  Administered 2016-09-26 – 2016-09-29 (×4): 324 mg
  Filled 2016-09-26 (×4): qty 4

## 2016-09-26 MED ORDER — POTASSIUM CHLORIDE 20 MEQ/15ML (10%) PO SOLN
40.0000 meq | Freq: Three times a day (TID) | ORAL | Status: AC
  Administered 2016-09-26 (×2): 40 meq
  Filled 2016-09-26 (×2): qty 30

## 2016-09-26 MED ORDER — ASPIRIN EC 325 MG PO TBEC: 325.0000 mg | DELAYED_RELEASE_TABLET | Freq: Every day | ORAL | Status: DC

## 2016-09-26 NOTE — Progress Notes (Signed)
RT called to patient room due to patient ventilator alarming "regulation pressure limited".  Noticed that peak pressures were increased to high 40s.  Changed patient inner cannula and took patient off of ventilator and bag lavaged patient.  Only able to suction a small amount of thick white secretions.  RN ordered a STAT chest xray.  MD aware.  Will continue to monitor.

## 2016-09-26 NOTE — Progress Notes (Signed)
Subjective: Several push button events without seizure on EEG  Exam: Vitals:   09/26/16 1245 09/26/16 1300  BP: (!) 86/54 (!) 82/56  Pulse: (!) 126 (!) 125  Resp: (!) 30 (!) 28  Temp:     Gen: In bed, tracheostomy Resp: ventilated.  Abd: soft, nt  Neuro:  MS: does not respond, does not open eyes.  [id number] reactive, doll's eye intact, corneals present bilaterally.   Motor: no movement to noxious stimuli Sensory:as above  CSF 2 WBC Protein 60  Impression: 30 yo M with new onset super refratory generalized status epilepticus(NORSE). Due to infectino in lumbar area, Tap was delayed and cervical puncture was pursued wihohut evidence of infection.   Suspicion therefore has been geared more towards a toxic/metabolic issue and the option that has been foremost in my thinking is medication related given that he was on antibiotics which lower seizure threshold.  His mother reports multiple episodes of decreased mental status, obtundation, etc. with beta-lactam antibiotics in the past and I wonder if he is somewhat sensitive to this Neurotoxicity reaction.   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 I have started anti-platelet therapy.   He had recurrent seizures on 12/10 and therefore was re-suppressed. Versed was stopped 12/17. It may take quite some time for him to wake up given how long the induced coma lasted.    Cervical puncture without evidence of infection. Elevated protein of unclear significance as this can be seen in many disorders, including status epilepticus.   Recommendations: 1) increase VPA to 1500mg  Q6H 2) continue keppra 1500mg  BID 3) continue vimpat 200mg  BID 4) continue topamax 100mg  BID     Roland Rack, MD [facility] [phone]  If 7pm- 7am, please page neurology on call as listed in [place]. 09/26/2016  1:53 PM

## 2016-09-26 NOTE — Progress Notes (Signed)
ANTICOAGULATION CONSULT NOTE - Initial Consult  Pharmacy Consult for heparin Indication: DVT/PE  Patient Measurements: Height: 5\' 8"  (172.7 cm) Weight: 113 lb (51.3 kg) IBW/kg (Calculated) : 68.4 Heparin Dosing Weight: 51 kg  Vital Signs: Temp: 99.3 F (37.4 C) (12/20 1952) Temp Source: Oral (12/20 1952) BP: 81/48 (12/20 1800) Pulse Rate: 116 (12/20 1800)  Labs:  Recent Labs  10/07/2016 0400 09/28/2016 0930 09/26/2016 1330 09/25/16 0500 09/26/16 0450 09/26/16 1846  HGB 5.9*  --  7.4* 9.0* 7.9*  --   HCT 18.0*  --  22.4* 27.0* 24.3*  --   PLT 233  --  219 247 175  --   LABPROT  --  17.6*  --   --   --   --   INR  --  1.43  --   --   --   --   HEPARINUNFRC  --   --   --   --   --  <0.10*  CREATININE 0.50*  --   --  0.54* 0.48*  --     Estimated Creatinine Clearance: 96.2 mL/min (by C-G formula based on SCr of 0.48 mg/dL (L)).  Assessment: 68 YOM with hx of spinal cord injury, quadriplegia trach & peg dependant, admitted on 11/19 with sepsis. Found to have subclavian/brachial DVT 2nd to PICC line 11/27. Originally holding off anticoagulation d/t low hgb/pltc and PEG site bleeding. Pt has IVC filter. 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 Pharmacy is consulted to restart heparin today. Hgb 7.9, pltc 175K. Noted multiple blood transfusions recently. Most recent INR 1.43 on 12/18. Initial HL is undetectable, no issues reported by RN.   Goal of Therapy:  Heparin level 0.3-0.7 units/ml Monitor platelets by anticoagulation protocol: Yes   Plan:  Re-bolus with 1,000 units of heparin from bag Increase heparin gtt to 950 units/hr Monitor daily heparin level, CBC, s/s of bleed  Elenor Quinones, PharmD, [facility] Clinical Pharmacist Pager [phone] 09/26/2016 8:44 PM

## 2016-09-26 NOTE — Progress Notes (Signed)
ANTICOAGULATION CONSULT NOTE - Initial Consult  Pharmacy Consult for heparin Indication: DVT/PE  Patient Measurements: Height: 5\' 8"  (172.7 cm) Weight: 113 lb (51.3 kg) IBW/kg (Calculated) : 68.4 Heparin Dosing Weight: 51 kg  Vital Signs: Temp: 96.6 F (35.9 C) (12/20 0809) Temp Source: Axillary (12/20 0809) BP: 91/56 (12/20 1030) Pulse Rate: 103 (12/20 1030)  Labs:  Recent Labs  09/17/2016 0400 09/20/2016 0930 10/04/2016 1330 09/25/16 0500 09/26/16 0450  HGB 5.9*  --  7.4* 9.0* 7.9*  HCT 18.0*  --  22.4* 27.0* 24.3*  PLT 233  --  219 247 175  LABPROT  --  17.6*  --   --   --   INR  --  1.43  --   --   --   CREATININE 0.50*  --   --  0.54* 0.48*    Estimated Creatinine Clearance: 96.2 mL/min (by C-G formula based on SCr of 0.48 mg/dL (L)).  Assessment: 62 YOM with hx of spinal cord injury, quadriplegia trach & peg dependant, admitted on 11/19 with sepsis. Found to have subclavian/brachial DVT 2nd to PICC line 11/27. Originally holding off anticoagulation d/t low hgb/pltc and PEG site bleeding. Pt has IVC filter. 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 Pharmacy is consulted to restart heparin today. Hgb 7.9, pltc 175K. Noted multiple blood transfusions recently. Most recent INR 1.43 on 12/18  Goal of Therapy:  Heparin level 0.3-0.7 units/ml Monitor platelets by anticoagulation protocol: Yes   Plan:  Heparin bolus 1000 units (smaller bolus d/t bleeding risk and possible elevated INR) Heparin infusion 800 units/hr F/u 6 hr heparin level at 1700 Daily heparin level and CBC  [name], PharmD, BCPS  Clinical Pharmacist  Pager: [phone]   09/26/2016,10:47 AM

## 2016-09-26 NOTE — Progress Notes (Signed)
D/c'ed LTM EEG per Dr. Leonel Ramsay.  Minor skin breakdown noted on forehead.

## 2016-09-26 NOTE — Procedures (Signed)
  Electroencephalogram report- LTM    Data acquisition: 10-20 electrode placement.  Additional T1, T2, and EKG electrodes; 26 channel digital referential acquisition reformatted to 18 channel/7 channel coronal bipolar     Spike detection: ON     Seizure detection: ON   Beginning time: 09/25/16 at 09 23 12 am  Ending time: 09/26/16 at 08 22 45 am   Day of study: day 1    This 24 hours of intensive EEG monitoring with simultaneous video monitoring was performed for this patient with spells of gaze deviation as a part of ongoing series to capture events of interest and determine if these are seizures.    Medications:as per EMR  There was 3  pushbutton activations events during this recording.  This events were not accompanied by any EEG changes to suggest seizures. Waking background activities were marked by attenuated 1-2 cps delta slowing with admixed faster frequencies regimen between 5-7 cps distributed broadly.   There were no clinical or subclinical seizures present.  No interictal epileptiform discharges.    Clinical interpretation: This 24 hours of intensive EEG monitoring with simultaneous monitoring did not record any clinical or subclinical seizures.  Background activities were mild background activity slowing suggestive of encephalopathy of nonspecific etiologies including toxic metabolic pharmacologic or multifocal degenerative etiologies.  There is no interictal epileptiform discharges.  No clinical subclinical seizures present.  Clinical correlation is advised

## 2016-09-26 NOTE — Progress Notes (Signed)
Patient ventilator circuit changed, per protocol, without complications.

## 2016-09-26 NOTE — Progress Notes (Signed)
Regulation pressure limited alarming on vent, RT in room, MD notified, STAT Chest X-ray ordered.  Rowe Pavy, RN

## 2016-09-26 NOTE — Progress Notes (Signed)
Name: Jason [name] MRN: [id number] DOB: May 31, 1986    ADMISSION DATE:  09/05/2016 CONSULTATION DATE:  11/21  REFERRING MD :  Dr. Algis Liming ([facility])   CHIEF COMPLAINT:  Lethargy, vent mgmt   BRIEF PATIENT DESCRIPTION:   30 yo male developed altered mental status after receiving azactam in setting of chronic wound infections (sacrum/heels).  He has hx of C6 spine injury with quadriplegia, tracheostomy status.  SUBJECTIVE:  Airway pressure gradually increasing overnight  VITAL SIGNS: BP (!) 91/59   Pulse (!) 102   Temp (!) 96.6 F (35.9 C) (Axillary)   Resp (!) 22   Ht 5\' 8"  (1.727 m)   Wt 51.3 kg (113 lb)   SpO2 99%   BMI 17.18 kg/m   INTAKE/OUTPUT: I/O last 3 completed shifts: In: [id number] [I.V.:1328; Other:680; [id number]; IV Piggyback:865] Out: 11150.3 Din.Lis; Drains:0.3; Stool:2725]  PHYSICAL EXAMINATION: General: Chronically ill appearing young male in NAD on vent HEENT: Tracheostomy clean, no drainage  Cardiovascular:  s1 s2 Regular rhythm Pulmonary: resps even non labored on vent, few scattered rhonchi  Abdomen: Soft. Normal bowel sounds. Non distended, colostomy, Peg clamped, TF on hold  Neurological: no response to verbal stimuli, pupils 3 mm =/R, sedate in an induced coma EXT:  RUE>LUE edema   CMP Latest Ref Rng & Units 09/26/2016 09/25/2016 09/23/2016  Glucose 65 - 99 mg/dL 193(H) 193(H) 325(H)  BUN 6 - 20 mg/dL 16 14 13   Creatinine 0.61 - 1.24 mg/dL 0.48(L) 0.54(L) 0.50(L)  Sodium 135 - 145 mmol/L 147(H) 145 144  Potassium 3.5 - 5.1 mmol/L 4.2 2.8(L) 3.4(L)  Chloride 101 - 111 mmol/L 128(H) 124(H) 124(H)  CO2 22 - 32 mmol/L 15(L) 13(L) 15(L)  Calcium 8.9 - 10.3 mg/dL 9.0 8.4(L) 8.2(L)  Total Protein 6.5 - 8.1 g/dL - - -  Total Bilirubin 0.3 - 1.2 mg/dL - - -  Alkaline Phos 38 - 126 U/L - - -  AST 15 - 41 U/L - - -  ALT 17 - 63 U/L - - -    CBC Latest Ref Rng & Units 09/26/2016 09/25/2016 09/19/2016  WBC 4.0 - 10.5 K/uL 17.6(H) 21.4(H)  16.8(H)  Hemoglobin 13.0 - 17.0 g/dL 7.9(L) 9.0(L) 7.4(L)  Hematocrit 39.0 - 52.0 % 24.3(L) 27.0(L) 22.4(L)  Platelets 150 - 400 K/uL 175 247 219    Imaging: Ct Head Wo Contrast  Result Date: 09/25/2016 CLINICAL DATA:  Gaze palsy.  Seizure like activity. EXAM: CT HEAD WITHOUT CONTRAST TECHNIQUE: Contiguous axial images were obtained from the base of the skull through the vertex without intravenous contrast. 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 The punctate acute pontine infarct on MRI is also not apparent on CT. There is no evidence of acute cortical infarct, intracranial hemorrhage, mass, midline shift, or extra-axial fluid collection. The ventricles and sulci are normal. Vascular: No hyperdense vessel or unexpected calcification. Skull: No fracture or osseous lesion. Sinuses/Orbits: Mild paranasal sinus mucosal thickening and small mastoid effusions. Unremarkable orbits. Other: None. IMPRESSION: No evidence of acute intracranial abnormality. Punctate pontine infarct on MRI is not apparent on CT. Electronically Signed   By: Logan Bores M.D.   On: 09/25/2016 10:56   Ir Fluoro Guide Ndl Plmt / Bx  Result Date: 09/25/2016 INDICATION: Change in mental status.  Seizures.  High white count. EXAM: IR FLUORO GUIDE NEEDLE PLACEMENT /BIOPSY MEDICATIONS: As per general anesthesia. ANESTHESIA/SEDATION: General anesthesia. FLUOROSCOPY TIME:  Fluoroscopy Time:  33.9 minutes seconds (1163 mGy). COMPLICATIONS: None immediate. PROCEDURE: Informed written consent was obtained from the patient after a thorough discussion of the procedural risks, benefits and alternatives. All questions were addressed. Maximal Sterile Barrier Technique was utilized including caps, mask, sterile gowns, sterile gloves, sterile drape, hand hygiene and skin antiseptic. A timeout was performed prior to the initiation of the procedure. The patient was laid  prone on the fluoroscopic table. The skin overlying the craniovertebral junction was then prepped and draped in the usual sterile fashion. Under biplane intermittent fluoroscopy, via the left sided approach, a 22 gauge spinal needle was advanced under intermittent fluoroscopy into the posterior arachnoid space at the level of C1-C2. Free egress of clear CSF was obtained. Approximately 8 cc of a clear CSF of was then obtained in separate vials and sent for microbiologic analysis. The needle was then removed. Hemostasis was achieved at the skin entry site. Initial attempts were made from the right side again using a 22 gauge spinal needle but were unsuccessful in gaining entry into the posterior subarachnoid space. IMPRESSION: Status post fluoroscopic guided needle placement in the subarachnoid space at C1-C2 posteriorly as described above with the retrieval of approximately 8 cc of clear CSF. Dr Estanislado Pandy and Dr Jola Baptist. Electronically Signed   By: Luanne Bras M.D.   On: 09/27/2016 13:08   Dg Chest Port 1 View  Result Date: 09/26/2016 CLINICAL DATA:  Followup right-sided infiltrate EXAM: PORTABLE CHEST 1 VIEW COMPARISON:  09/25/2016 FINDINGS: Cardiac shadow is stable but somewhat obscured by a basilar infiltrates. Left-sided PICC line and tracheostomy tube are again noted and stable. There is some persistent increased density in the medial aspect of the left lung base. Additionally consolidation in the right base is noted stable from the prior exam. No pneumothorax is seen. No bony abnormality is noted. IMPRESSION: Stable consolidation in the right base with increasing infiltrate in the medial left base. Electronically Signed   By: Inez Catalina M.D.   On: 09/26/2016 09:09   Dg Chest Port 1 View  Result Date: 09/25/2016 CLINICAL DATA:  Respiratory failure.  Hypoxia. EXAM: PORTABLE CHEST 1 VIEW COMPARISON:  09/22/2016, 09/28/2016, 08/28/2016. FINDINGS: Tracheostomy tube noted in good anatomic position.  Left PICC line noted with tip projected over superior vena cava. Heart size stable. Persist atelectasis right lower lobe. Partial clearing of bilateral pulmonary infiltrates. Small right pleural effusion. IMPRESSION: 1. Lines and tubes in stable position. 2.  Persistent right lower lobe atelectasis. 3. Partial clearing of bilateral pulmonary infiltrates. Small right pleural effusion again noted. Electronically Signed   By: Marcello Moores  Register   On: 09/25/2016 08:15   SIGNIFICANT EVENTS: 11/22 - surgical bx sacral bone/wound 12/04 - transfer to ICU with status epilepticus 12/07 - eeg remains c poor neurostatus 12/17 - No seizures overnight, versed drip at 30mg /hr, intermittent hypothermia, decreased PEG tube drainage 12/18 - cervical puncture >>  LINES: PICC LUE 12/15 >>  Trach (chronic).  STUDIES:  EEG 12/06 >> burst suppression MRI brain 12/09 >> acute infarct Lt paracentral mid pons 3 mm, chronic Rt cerebellar infarct, atrophy  MICROBIOLOGY:  Blood Ctx x3 11/19:  3/3 Streptococcus constellatus  Urine Ctx 11/19:  Acinetobacter & Enterococcus faecalis MRSA PCR 11/20:  Positive Sacral Bone Ctx 11/22:  ESBL Kleb pna, Enterococcus faecalis, strep constellatus Blood Ctx x2 11/23:  Negative  Tracheal Asp Ctx 11/23:  Klebsiella pneumoniae & Acinetobacter Tracheal Asp Ctx 11/24:  Klebsiella pneumoniae & Acinetobacter Tracheal Asp Ctx 11/30:  Pseudomonas aeruginosa & Stenotrophomonas  ASSESSMENT / PLAN:  Status epilepticus >> possibly related to Abx vs meningitis  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 CSF negative for infection C6 spine injury with quadriplegia. - AEDs per neurology - EEG reordered by neuro, awaiting results - Versed off. - Monitor for further clinical seizure  Acute on chronic respiratory failure. Tracheostomy status. - Full vent support --> wean when MS allows. - Defer trach change for now. - F/u CXR PRN. - Budesonide BID. - Active diureses today.  HCAP, recurrent UTI,  osteomyelitis, sacral/heel wounds. - Monitor off Abx - ID following - WOC for dressing recommendations - Still has Pseudomonas and Stenotrophomonas in sputum from 12/08 >> ?if this is chronic colonization. Will defer to ID  Non-anion gap Metabolic acidosis in setting of hyperchloremia  Hypomagenesemia  Hypokalemia - KVO IVF - F/u cmp in am   - Replace electrolytes as indicated - Lasix 40 mg IV q6 x3 doses and albumin 25% 25 gm IV q6 x3 doses.  Anemia of critical illness:  f/u CBC after transfusion 12/11 12/12 6.2->8.1 - Trend CBC. - Transfuse per ICU protocol.  Rt subclavian/brachial DVT 2nd to PICC line 11/27. 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 Chronic diastolic CHF. Hypotension 2nd to sedation. - Pressors to keep MAP > 55 - Midodrine - IJ changed to PICC 12/15 - Restart heparin drip for PE  Severe protein calorie malnutrition. DM gastroparesis. - Resume tube feeds.  DM type 1. - SSI - KVO IVF  [place] - No family bedside 12/20.   The patient is critically ill with multiple organ systems failure and requires high complexity decision making for assessment and support, frequent evaluation and titration of therapies, application of advanced monitoring technologies and extensive interpretation of multiple databases.   Critical Care Time devoted to patient care services described in this note is  35  Minutes. This time reflects time of care of this signee Dr Jennet Maduro. This critical care time does not reflect procedure time, or teaching time or supervisory time of PA/NP/Med student/Med Resident etc but could involve care discussion time.  Rush Farmer, M.D. [facility] Pulmonary/Critical Care Medicine. Pager: [phone]. After hours pager: [phone].

## 2016-09-27 ENCOUNTER — Inpatient Hospital Stay (HOSPITAL_COMMUNITY): Payer: Medicaid Other

## 2016-09-27 DIAGNOSIS — I2699 Other pulmonary embolism without acute cor pulmonale: Secondary | ICD-10-CM

## 2016-09-27 DIAGNOSIS — I2782 Chronic pulmonary embolism: Secondary | ICD-10-CM

## 2016-09-27 DIAGNOSIS — I959 Hypotension, unspecified: Secondary | ICD-10-CM

## 2016-09-27 LAB — BASIC METABOLIC PANEL
ANION GAP: 6 (ref 5–15)
BUN: 18 mg/dL (ref 6–20)
CALCIUM: 10 mg/dL (ref 8.9–10.3)
CO2: 17 mmol/L — AB (ref 22–32)
CREATININE: 0.53 mg/dL — AB (ref 0.61–1.24)
Chloride: 129 mmol/L — ABNORMAL HIGH (ref 101–111)
GFR calc Af Amer: >60 mL/min (ref >60)
GLUCOSE: 105 mg/dL — AB (ref 65–99)
Potassium: 4 mmol/L (ref 3.5–5.1)
Sodium: 152 mmol/L — ABNORMAL HIGH (ref 135–145)

## 2016-09-27 LAB — VALPROIC ACID LEVEL
VALPROIC ACID LVL: 71 ug/mL (ref 50.0–100.0)
Valproic Acid Lvl: 86 ug/mL (ref 50.0–100.0)

## 2016-09-27 LAB — HEPARIN LEVEL (UNFRACTIONATED)
Heparin Unfractionated: 0.22 IU/mL — ABNORMAL LOW (ref 0.30–0.70)
Heparin Unfractionated: 0.15 IU/mL — ABNORMAL LOW (ref 0.30–0.70)

## 2016-09-27 LAB — GLUCOSE, CAPILLARY
GLUCOSE-CAPILLARY: 212 mg/dL — AB (ref 65–99)
Glucose-Capillary: 133 mg/dL — ABNORMAL HIGH (ref 65–99)
Glucose-Capillary: 132 mg/dL — ABNORMAL HIGH (ref 65–99)
Glucose-Capillary: 101 mg/dL — ABNORMAL HIGH (ref 65–99)

## 2016-09-27 LAB — CBC
HEMATOCRIT: 22.9 % — AB (ref 39.0–52.0)
Hemoglobin: 7.3 g/dL — ABNORMAL LOW (ref 13.0–17.0)
MCH: 29.8 pg (ref 26.0–34.0)
MCHC: 31.9 g/dL (ref 30.0–36.0)
MCV: 93.5 fL (ref 78.0–100.0)
PLATELETS: 108 10*3/uL — AB (ref 150–400)
RBC: 2.45 MIL/uL — ABNORMAL LOW (ref 4.22–5.81)
RDW: 21.6 % — AB (ref 11.5–15.5)
WBC: 20 10*3/uL — AB (ref 4.0–10.5)

## 2016-09-27 LAB — PHOSPHORUS: Phosphorus: 3.4 mg/dL (ref 2.5–4.6)

## 2016-09-27 LAB — MAGNESIUM: Magnesium: 2.1 mg/dL (ref 1.7–2.4)

## 2016-09-27 MED ORDER — FREE WATER
250.0000 mL | Freq: Four times a day (QID) | Status: DC
  Administered 2016-09-27 – 2016-10-05 (×28): 250 mL

## 2016-09-27 MED ORDER — GADOBENATE DIMEGLUMINE 529 MG/ML IV SOLN
10.0000 mL | Freq: Once | INTRAVENOUS | Status: AC | PRN
  Administered 2016-09-27: 9 mL via INTRAVENOUS

## 2016-09-27 MED ORDER — SODIUM CHLORIDE 0.9 % IV SOLN
INTRAVENOUS | Status: DC
  Administered 2016-10-01: 22:00:00 via INTRAVENOUS

## 2016-09-27 NOTE — Progress Notes (Signed)
Subjective: Some facial movements in response to stimuli per nursing.   Exam: Vitals:   09/27/16 0830 09/27/16 0900  BP:  112/73  Pulse: 75 74  Resp: (!) 22 (!) 22  Temp:     Gen: In bed, tracheostomy Resp: ventilated.  Abd: soft, nt  Neuro:  MS: does not respond, does not open eyes.  [id number] reactive, doll's eye intact, corneals present bilaterally.   Motor: mild extension to noxious stim in bilateral arms.  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 Due to infectino in lumbar area, Tap was delayed and cervical puncture was pursued without evidence of infection.   Suspicion therefore has been geared more towards a toxic/metabolic issue and the option that has been foremost in my thinking is medication related given that he was on antibiotics which lower seizure threshold.  His mother reports multiple episodes of decreased mental status, obtundation, etc. with beta-lactam antibiotics in the past and I wonder if he is somewhat sensitive to this Neurotoxicity reaction.   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 I have started anti-platelet therapy.   He had recurrent seizures on 12/10 and therefore was re-suppressed. Versed was stopped 12/17. It may take quite some time for him to wake up given how long the induced coma lasted.    Cervical puncture without evidence of infection. Elevated protein of unclear significance as this can be seen in many disorders, including status epilepticus.   Recommendations: 1) continue depakote 1gm Q6H 2) continue keppra 1500mg  BID 3) continue vimpat 200mg  BID 4) continue topamax 100mg  BID 5) repeat MRI brain    Roland Rack, MD [facility] [phone]  If 7pm- 7am, please page neurology on call as listed in [place]. 09/27/2016  10:46 AM

## 2016-09-27 NOTE — Progress Notes (Signed)
ANTICOAGULATION CONSULT NOTE - Follow Up Consult  Pharmacy Consult for Heparin Indication: DVT/PE  Allergies  Allergen Reactions  . Amikacin Sulfate Other (See Comments)    Seizure   . Cefuroxime Axetil Anaphylaxis    Tolerated cefepime 12/2015 - with Pepcid/Solu-Medrol  . Imipenem Other (See Comments)    SEIZURES, ? DOSE RELATED ?  Marland Kitchen Penicillins Anaphylaxis and Other (See Comments)    Tolerated Imipenem; no reaction to 7 day course of amoxicillin in 2015 Has patient had a PCN reaction causing immediate rash, facial/tongue/throat swelling, SOB or lightheadedness with hypotension: Yes Has patient had a PCN reaction causing severe rash involving mucus membranes or skin necrosis: No Has patient had a PCN reaction that required hospitalization Yes Has patient had a PCN reaction occurring within the last 10 years: No If all of the above answers are "NO", then may proceed with Cephalo  . Sulfa Antibiotics Anaphylaxis and Shortness Of Breath  . Tessalon [Benzonatate] Anaphylaxis  . Aztreonam Swelling    FACIAL AND NECK SWELLING WITHOUT PREMEDICATION.  . Ertapenem Rash and Other (See Comments)    CONFUSION 'IMIPENEM RESPONSIBLE FOR SEIZURES'  . Levaquin [Levofloxacin In D5w] Swelling    Hand and forearm swelling  . Meropenem Other (See Comments)    ALTERED MENTAL STATUS, CONFUSION 'IMIPENEM RESPONSIBLE FOR SEIZURES'  . Morphine And Related Other (See Comments)    ALTERED MENTAL STATUS CONFUSION, HEADACHES VISUAL HALLUCINATIONS  . Nsaids Itching and Other (See Comments)    Risk of bleeding, itching  . Shellfish Allergy Itching    Took benadryl to alleviate reaction  . Rocephin [Ceftriaxone] Other (See Comments)    Confusion    Patient Measurements: Height: 5\' 8"  (172.7 cm) Weight: 96 lb (43.5 kg) IBW/kg (Calculated) : 68.4 Heparin Dosing Weight:  51 kg  Vital Signs: Temp: 97.6 F (36.4 C) (12/21 0403) Temp Source: Oral (12/21 0403) BP: 112/73 (12/21 0900) Pulse Rate: 74  (12/21 0900)  Labs:  Recent Labs  09/25/16 0500 09/26/16 0450 09/26/16 1846 09/27/16 0500 09/27/16 0920  HGB 9.0* 7.9*  --  7.3*  --   HCT 27.0* 24.3*  --  22.9*  --   PLT 247 175  --  108*  --   HEPARINUNFRC  --   --  <0.10*  --  0.22*  CREATININE 0.54* 0.48*  --  0.53*  --     Estimated Creatinine Clearance: 81.6 mL/min (by C-G formula based on SCr of 0.53 mg/dL (L)).  Assessment:  Anticoag: PE hx 02/2015 s/p IVC filter.  NEW DVT in right UE 11/27 and again 12/5 doppler,stopped UFH 2nd low PLTC (37 on 11/27) and bleeding PEG site. 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 Noted multiple blood transfusions recently. Hgb back down to 7.3. Plts down again 108 today. HL 0.22 - Foot stick for levels, DVT in R arm, PICC in L arm/no levels  Goal of Therapy:  Heparin level 0.3-0.7 units/ml Monitor platelets by anticoagulation protocol: Yes   Plan:  Increase IV heparin to 1000 units/hr Recheck HL in 6 hrs Daily HL and CBC   Kekoa Fyock S. Alford Highland, PharmD, [facility] Clinical Staff Pharmacist Pager [phone]  Eilene Ghazi Stillinger 09/27/2016,10:03 AM

## 2016-09-27 NOTE — Progress Notes (Signed)
Subjective:  Patient still not responsive   Antibiotics:  Anti-infectives    Start     Dose/Rate Route Frequency Ordered Stop   09/11/16 2200  ceFEPIme (MAXIPIME) 2 g in dextrose 5 % 50 mL IVPB  Status:  Discontinued     2 g 100 mL/hr over 30 Minutes Intravenous Every 8 hours 09/11/16 1830 09/11/16 1838   09/11/16 2200  ceFEPIme (MAXIPIME) 2 g in dextrose 5 % 50 mL IVPB  Status:  Discontinued     2 g 100 mL/hr over 30 Minutes Intravenous Every 8 hours 09/11/16 1838 09/15/16 1441   09/11/16 2000  ceFEPIme (MAXIPIME) 2 g in dextrose 5 % 50 mL IVPB  Status:  Discontinued     2 g 100 mL/hr over 30 Minutes Intravenous Every 8 hours 09/11/16 1813 09/11/16 1830   09/11/16 1400  meropenem (MERREM) 1 g in sodium chloride 0.9 % 100 mL IVPB  Status:  Discontinued     1 g 200 mL/hr over 30 Minutes Intravenous Every 8 hours 09/11/16 1150 09/11/16 1813   09/11/16 1400  sulfamethoxazole-trimethoprim (BACTRIM) 240 mg of trimethoprim in dextrose 5 % 250 mL IVPB  Status:  Discontinued     240 mg of trimethoprim 265 mL/hr over 60 Minutes Intravenous Every 8 hours 09/11/16 1156 09/15/16 1441   09/10/16 2100  levofloxacin (LEVAQUIN) IVPB 750 mg  Status:  Discontinued     750 mg 100 mL/hr over 90 Minutes Intravenous Every 24 hours 09/10/16 1849 09/11/16 1115   09/10/16 2000  ceFEPIme (MAXIPIME) 2 g in dextrose 5 % 50 mL IVPB  Status:  Discontinued     2 g 100 mL/hr over 30 Minutes Intravenous Every 8 hours 09/10/16 1847 09/11/16 1140   09/08/16 1700  imipenem-cilastatin (PRIMAXIN) 1,000 mg in sodium chloride 0.9 % 250 mL IVPB  Status:  Discontinued     1,000 mg 250 mL/hr over 60 Minutes Intravenous Every 8 hours 09/08/16 1159 09/10/16 1736   09/01/16 1300  anidulafungin (ERAXIS) 100 mg in sodium chloride 0.9 % 100 mL IVPB  Status:  Discontinued     100 mg over 90 Minutes Intravenous Every 24 hours 08/31/16 1240 09/02/16 1252   09/01/16 1200  vancomycin (VANCOCIN) IVPB 750 mg/150 ml premix   Status:  Discontinued     750 mg 150 mL/hr over 60 Minutes Intravenous Every 24 hours 09/01/16 1145 09/01/16 1712   08/31/16 1700  imipenem-cilastatin (PRIMAXIN) 500 mg in sodium chloride 0.9 % 100 mL IVPB  Status:  Discontinued     500 mg 200 mL/hr over 30 Minutes Intravenous Every 8 hours 08/31/16 1240 09/08/16 1159   08/31/16 1300  anidulafungin (ERAXIS) 200 mg in sodium chloride 0.9 % 200 mL IVPB     200 mg over 180 Minutes Intravenous  Once 08/31/16 1240 08/31/16 1810   08/19/2016 0557  tigecycline (TYGACIL) 50 mg in sodium chloride 0.9 % 100 mL IVPB  Status:  Discontinued     50 mg 200 mL/hr over 30 Minutes Intravenous Every 12 hours 08/28/16 1758 08/31/16 1151   08/28/16 1800  tigecycline (TYGACIL) 100 mg in sodium chloride 0.9 % 100 mL IVPB  Status:  Discontinued     100 mg 200 mL/hr over 30 Minutes Intravenous  Once 08/28/16 1758 08/31/16 1424   08/28/16 1300  linezolid (ZYVOX) IVPB 600 mg  Status:  Discontinued     600 mg 300 mL/hr over 60 Minutes Intravenous Every 12 hours 08/28/16 1239 08/31/16  2014   08/28/16 0200  aztreonam (AZACTAM) 1 g in dextrose 5 % 50 mL IVPB  Status:  Discontinued     1 g 100 mL/hr over 30 Minutes Intravenous Every 8 hours 08/27/16 1802 08/28/16 1232   08/27/16 2000  vancomycin (VANCOCIN) 500 mg in sodium chloride 0.9 % 100 mL IVPB  Status:  Discontinued     500 mg 100 mL/hr over 60 Minutes Intravenous Every 12 hours 08/27/16 1802 08/28/16 1239   08/27/16 1800  aztreonam (AZACTAM) 2 g in dextrose 5 % 50 mL IVPB     2 g 100 mL/hr over 30 Minutes Intravenous  Once 08/27/16 1634 08/27/16 1852   08/27/16 1600  levofloxacin (LEVAQUIN) IVPB 750 mg  Status:  Discontinued     750 mg 100 mL/hr over 90 Minutes Intravenous Every 24 hours 08/15/2016 1324 08/18/2016 1522   08/27/16 0000  vancomycin (VANCOCIN) 500 mg in sodium chloride 0.9 % 100 mL IVPB  Status:  Discontinued     500 mg 100 mL/hr over 60 Minutes Intravenous Every 12 hours 08/09/2016 1324 08/25/2016 1522     08/21/2016 2200  aztreonam (AZACTAM) 1 g in dextrose 5 % 50 mL IVPB  Status:  Discontinued     1 g 100 mL/hr over 30 Minutes Intravenous Every 8 hours 08/11/2016 1324 09/02/2016 1522   08/23/2016 1600  linezolid (ZYVOX) tablet 600 mg  Status:  Discontinued     600 mg Oral Every 12 hours 08/20/2016 1458 08/27/16 1347   08/27/2016 1300  levofloxacin (LEVAQUIN) IVPB 750 mg  Status:  Discontinued     750 mg 100 mL/hr over 90 Minutes Intravenous  Once 09/06/2016 1246 09/06/2016 1522   09/03/2016 1300  aztreonam (AZACTAM) 2 g in dextrose 5 % 50 mL IVPB  Status:  Discontinued     2 g 100 mL/hr over 30 Minutes Intravenous  Once 08/31/2016 1246 09/03/2016 1522   08/18/2016 1300  vancomycin (VANCOCIN) IVPB 1000 mg/200 mL premix  Status:  Discontinued     1,000 mg 200 mL/hr over 60 Minutes Intravenous  Once 08/18/2016 1246 08/15/2016 1522      Medications: Scheduled Meds: . albuterol  2.5 mg Nebulization QID  . artificial tears   Both Eyes Q8H  . aspirin  324 mg Per Tube Daily  . baclofen  10 mg Oral QID  . budesonide (PULMICORT) nebulizer solution  0.5 mg Nebulization BID  . chlorhexidine gluconate (MEDLINE KIT)  15 mL Mouth Rinse BID  . insulin aspart  0-15 Units Subcutaneous Q4H  . insulin glargine  5 Units Subcutaneous QHS  . lacosamide (VIMPAT) IV  200 mg Intravenous Q12H  . levETIRAcetam  1,500 mg Intravenous Q12H  . mouth rinse  15 mL Mouth Rinse 10 times per day  . midazolam  40 mg Intravenous Once  . midodrine  20 mg Per Tube TID WC  . oxybutynin  5 mg Per Tube BID  . pantoprazole sodium  40 mg Per Tube Daily  . sodium chloride flush  10-40 mL Intracatheter Q12H  . topiramate  100 mg Oral BID  . valproate sodium  1,000 mg Intravenous Q4H   Continuous Infusions: . feeding supplement (VITAL AF 1.2 CAL) 1,000 mL (09/27/16 0400)  . heparin 950 Units/hr (09/27/16 0400)  . midazolam (VERSED) infusion Stopped (09/23/16 1400)  . phenylephrine (NEO-SYNEPHRINE) Adult infusion 10 mcg/min (09/27/16 0400)   PRN  Meds:.acetaminophen (TYLENOL) oral liquid 160 mg/5 mL, fentaNYL (SUBLIMAZE) injection, LORazepam, metoprolol, naLOXone (NARCAN)  injection, sodium chloride flush  Objective: Weight change: -17 lb (-7.711 kg)  Intake/Output Summary (Last 24 hours) at 09/27/16 0837 Last data filed at 09/27/16 0740  Gross per 24 hour  Intake          2811.39 ml  Output             7550 ml  Net         -4738.61 ml   Blood pressure 110/62, pulse 74, temperature 97.6 F (36.4 C), temperature source Oral, resp. rate 18, height 5' 8"  (1.727 m), weight 96 lb (43.5 kg), SpO2 100 %. Temp:  [97.6 F (36.4 C)-99.9 F (37.7 C)] 97.6 F (36.4 C) (12/21 0403) Pulse Rate:  [70-127] 74 (12/21 0735) Resp:  [18-36] 18 (12/21 0735) BP: (73-139)/(43-87) 110/62 (12/21 0735) SpO2:  [97 %-100 %] 100 % (12/21 0735) FiO2 (%):  [30 %] 30 % (12/21 0735) Weight:  [96 lb (43.5 kg)] 96 lb (43.5 kg) (12/21 0400)  Physical Exam: General: Unresponsive,  HEENT: anicteric sclera, pupils edematous fairly fixed , face is more edematous, Positive doll's eye CVS regular rate, normal r,  no murmur rubs or gallops Chest: clear to auscultation bilaterally, no wheezing, rales or rhonchi Abdomen: soft nondistended, normal bowel sounds, Extremities:skin: decubitus ulcers not examined today.  Neuro: No new focal deficits  CBC:  CBC Latest Ref Rng & Units 09/27/2016 09/26/2016 09/25/2016  WBC 4.0 - 10.5 K/uL 20.0(H) 17.6(H) 21.4(H)  Hemoglobin 13.0 - 17.0 g/dL 7.3(L) 7.9(L) 9.0(L)  Hematocrit 39.0 - 52.0 % 22.9(L) 24.3(L) 27.0(L)  Platelets 150 - 400 K/uL 108(L) 175 247     BMET  Recent Labs  09/26/16 0450 09/27/16 0500  NA 147* 152*  K 4.2 4.0  CL 128* 129*  CO2 15* 17*  GLUCOSE 193* 105*  BUN 16 18  CREATININE 0.48* 0.53*  CALCIUM 9.0 10.0     Liver Panel  No results for input(s): PROT, ALBUMIN, AST, ALT, ALKPHOS, BILITOT, BILIDIR, IBILI in the last 72 hours.     Sedimentation Rate No results for input(s):  ESRSEDRATE in the last 72 hours. C-Reactive Protein No results for input(s): CRP in the last 72 hours.  Micro Results: Recent Results (from the past 720 hour(s))  Aerobic/Anaerobic Culture (surgical/deep wound)     Status: None   Collection Time: 09/03/2016  4:01 PM  Result Value Ref Range Status   Specimen Description TISSUE BONE  Final   Special Requests SACRAL  Final   Gram Stain   Final    ABUNDANT WBC PRESENT, PREDOMINANTLY PMN ABUNDANT GRAM NEGATIVE RODS ABUNDANT GRAM POSITIVE COCCI IN PAIRS FEW GRAM POSITIVE RODS    Culture   Final    MODERATE KLEBSIELLA PNEUMONIAE Confirmed Extended Spectrum Beta-Lactamase Producer (ESBL) MODERATE ENTEROCOCCUS FAECALIS MODERATE STREPTOCOCCUS CONSTELLATUS MIXED ANAEROBIC FLORA PRESENT.  CALL LAB IF FURTHER IID REQUIRED.    Report Status 09/03/2016 FINAL  Final   Organism ID, Bacteria KLEBSIELLA PNEUMONIAE  Final   Organism ID, Bacteria ENTEROCOCCUS FAECALIS  Final      Susceptibility   Enterococcus faecalis - MIC*    AMPICILLIN <=2 SENSITIVE Sensitive     VANCOMYCIN 1 SENSITIVE Sensitive     GENTAMICIN SYNERGY SENSITIVE Sensitive     * MODERATE ENTEROCOCCUS FAECALIS   Klebsiella pneumoniae - MIC*    AMPICILLIN >=32 RESISTANT Resistant     CEFAZOLIN >=64 RESISTANT Resistant     CEFEPIME >=64 RESISTANT Resistant     CEFTAZIDIME >=64 RESISTANT Resistant     CEFTRIAXONE >=64 RESISTANT Resistant  CIPROFLOXACIN 2 INTERMEDIATE Intermediate     GENTAMICIN >=16 RESISTANT Resistant     IMIPENEM <=0.25 SENSITIVE Sensitive     TRIMETH/SULFA >=320 RESISTANT Resistant     AMPICILLIN/SULBACTAM >=32 RESISTANT Resistant     PIP/TAZO >=128 RESISTANT Resistant     Extended ESBL POSITIVE Resistant     * MODERATE KLEBSIELLA PNEUMONIAE  Culture, blood (Routine X 2) w Reflex to ID Panel     Status: None   Collection Time: 08/30/16  9:30 AM  Result Value Ref Range Status   Specimen Description BLOOD LEFT ANTECUBITAL  Final   Special Requests  BOTTLES DRAWN AEROBIC ONLY 5CC  Final   Culture NO GROWTH 5 DAYS  Final   Report Status 09/04/2016 FINAL  Final  Culture, blood (Routine X 2) w Reflex to ID Panel     Status: None   Collection Time: 08/30/16  9:35 AM  Result Value Ref Range Status   Specimen Description BLOOD BLOOD LEFT ARM  Final   Special Requests BOTTLES DRAWN AEROBIC ONLY 5CC  Final   Culture NO GROWTH 5 DAYS  Final   Report Status 09/04/2016 FINAL  Final  Culture, respiratory (NON-Expectorated)     Status: None   Collection Time: 08/30/16  3:56 PM  Result Value Ref Range Status   Specimen Description TRACHEAL ASPIRATE  Final   Special Requests NONE  Final   Gram Stain   Final    ABUNDANT WBC PRESENT,BOTH PMN AND MONONUCLEAR MODERATE GRAM POSITIVE COCCI IN PAIRS FEW GRAM VARIABLE ROD FEW YEAST    Culture   Final    MODERATE KLEBSIELLA PNEUMONIAE MODERATE ACINETOBACTER CALCOACETICUS/BAUMANNII COMPLEX Confirmed Extended Spectrum Beta-Lactamase Producer (ESBL) for KLEBSIELLA PNEUMONIAE    Report Status 09/02/2016 FINAL  Final   Organism ID, Bacteria KLEBSIELLA PNEUMONIAE  Final   Organism ID, Bacteria ACINETOBACTER CALCOACETICUS/BAUMANNII COMPLEX  Final      Susceptibility   Acinetobacter calcoaceticus/baumannii complex - MIC*    CEFTAZIDIME >=64 RESISTANT Resistant     CEFTRIAXONE >=64 RESISTANT Resistant     CIPROFLOXACIN >=4 RESISTANT Resistant     GENTAMICIN 4 SENSITIVE Sensitive     IMIPENEM 1 SENSITIVE Sensitive     PIP/TAZO >=128 RESISTANT Resistant     TRIMETH/SULFA >=320 RESISTANT Resistant     CEFEPIME >=64 RESISTANT Resistant     AMPICILLIN/SULBACTAM 4 SENSITIVE Sensitive     * MODERATE ACINETOBACTER CALCOACETICUS/BAUMANNII COMPLEX   Klebsiella pneumoniae - MIC*    AMPICILLIN >=32 RESISTANT Resistant     CEFAZOLIN >=64 RESISTANT Resistant     CEFEPIME >=64 RESISTANT Resistant     CEFTAZIDIME 16 RESISTANT Resistant     CEFTRIAXONE >=64 RESISTANT Resistant     CIPROFLOXACIN 1 SENSITIVE  Sensitive     GENTAMICIN >=16 RESISTANT Resistant     IMIPENEM <=0.25 SENSITIVE Sensitive     TRIMETH/SULFA >=320 RESISTANT Resistant     AMPICILLIN/SULBACTAM >=32 RESISTANT Resistant     PIP/TAZO 8 SENSITIVE Sensitive     Extended ESBL POSITIVE Resistant     * MODERATE KLEBSIELLA PNEUMONIAE  Fungus Culture With Stain     Status: None   Collection Time: 08/30/16  3:56 PM  Result Value Ref Range Status   Fungus Stain Final report  Final   Fungus (Mycology) Culture Preliminary report  Final    Comment: (NOTE) Performed At: [facility] [address] [place], [place] [id number] Lindon Romp MD ZG:[id number]    Fungal Source TRACHEAL ASPIRATE  Final  Fungus Culture Result  Status: None   Collection Time: 08/30/16  3:56 PM  Result Value Ref Range Status   Result 1 Yeast observed  Final    Comment: (NOTE) Performed At: [facility] [place], [place] [id number] Lindon Romp MD [id number]   Fungal organism reflex     Status: None   Collection Time: 08/30/16  3:56 PM  Result Value Ref Range Status   Fungal result 1 Candida albicans  Final    Comment: (NOTE) Light growth Performed At: [facility] [address] [place], [place] [id number] Lindon Romp MD FQ:[id number]   Culture, respiratory (NON-Expectorated)     Status: None   Collection Time: 08/31/16  1:20 AM  Result Value Ref Range Status   Specimen Description TRACHEAL ASPIRATE  Final   Special Requests NONE  Final   Gram Stain   Final    ABUNDANT WBC PRESENT,BOTH PMN AND MONONUCLEAR NO ORGANISMS SEEN    Culture   Final    MODERATE ACINETOBACTER CALCOACETICUS/BAUMANNII COMPLEX SUSCEPTIBILITIES PERFORMED ON PREVIOUS CULTURE WITHIN THE LAST 5 DAYS.    Report Status 09/04/2016 FINAL  Final  Culture, respiratory (NON-Expectorated)     Status: None   Collection Time: 09/06/16 10:36 PM  Result Value Ref Range Status   Specimen Description TRACHEAL ASPIRATE  Final    Special Requests Normal  Final   Gram Stain   Final    MODERATE WBC PRESENT,BOTH PMN AND MONONUCLEAR FEW GRAM NEGATIVE RODS    Culture   Final    ABUNDANT PSEUDOMONAS AERUGINOSA ABUNDANT STENOTROPHOMONAS MALTOPHILIA THE PSEUDOMONAS AERUGINOSA IS A  MULTI-DRUG RESISTANT ORGANISM Results Called to: RN N.WELLINGTON  [id number] MLM    Report Status 09/10/2016 FINAL  Final   Organism ID, Bacteria STENOTROPHOMONAS MALTOPHILIA  Final   Organism ID, Bacteria PSEUDOMONAS AERUGINOSA  Final      Susceptibility   Pseudomonas aeruginosa - MIC*    CEFTAZIDIME 8 SENSITIVE Sensitive     CIPROFLOXACIN >=4 RESISTANT Resistant     GENTAMICIN >=16 RESISTANT Resistant     IMIPENEM >=16 RESISTANT Resistant     PIP/TAZO >=128 RESISTANT Resistant     CEFEPIME 8 SENSITIVE Sensitive     * ABUNDANT PSEUDOMONAS AERUGINOSA   Stenotrophomonas maltophilia - MIC*    LEVOFLOXACIN 1 SENSITIVE Sensitive     TRIMETH/SULFA <=20 SENSITIVE Sensitive     * ABUNDANT STENOTROPHOMONAS MALTOPHILIA  Culture, respiratory (NON-Expectorated)     Status: None   Collection Time: 09/14/16  1:19 PM  Result Value Ref Range Status   Specimen Description TRACHEAL ASPIRATE  Final   Special Requests NONE  Final   Gram Stain   Final    ABUNDANT WBC PRESENT, PREDOMINANTLY PMN RARE GRAM NEGATIVE RODS MODERATE YEAST    Culture   Final    ABUNDANT PSEUDOMONAS AERUGINOSA MODERATE STENOTROPHOMONAS MALTOPHILIA PSEUDOMONAS AERUGINOSA MULTI-DRUG RESISTANT ORGANISM Results Called toKarlene Einstein PHARMD, AT [place] 09/26/2016 BY D. VANHOOK    Report Status 09/30/2016 FINAL  Final   Organism ID, Bacteria STENOTROPHOMONAS MALTOPHILIA  Final   Organism ID, Bacteria PSEUDOMONAS AERUGINOSA  Final      Susceptibility   Pseudomonas aeruginosa - MIC*    CEFTAZIDIME 8 SENSITIVE Sensitive     CIPROFLOXACIN >=4 RESISTANT Resistant     GENTAMICIN >=16 RESISTANT Resistant     IMIPENEM >=16 RESISTANT Resistant     CEFEPIME 16 INTERMEDIATE  Intermediate     * ABUNDANT PSEUDOMONAS AERUGINOSA   Stenotrophomonas maltophilia - MIC*    LEVOFLOXACIN  1 SENSITIVE Sensitive     TRIMETH/SULFA <=20 SENSITIVE Sensitive     * MODERATE STENOTROPHOMONAS MALTOPHILIA  Fungus Culture With Stain     Status: None (Preliminary result)   Collection Time: 09/27/2016 12:45 PM  Result Value Ref Range Status   Fungus Stain Final report  Final    Comment: (NOTE) Performed At: [facility] [address] [place], [place] [id number] Lindon Romp MD TR:[id number]    Fungus (Mycology) Culture PENDING  Incomplete   Fungal Source CSF  Final  CSF culture with Stat gram stain     Status: None (Preliminary result)   Collection Time: 09/17/2016 12:45 PM  Result Value Ref Range Status   Specimen Description CSF  Final   Special Requests NONE  Final   Gram Stain   Final    WBC PRESENT,BOTH PMN AND MONONUCLEAR NO ORGANISMS SEEN CYTOSPIN SMEAR    Culture NO GROWTH 2 DAYS  Final   Report Status PENDING  Incomplete  Fungus Culture Result     Status: None   Collection Time: 10/03/2016 12:45 PM  Result Value Ref Range Status   Result 1 Comment  Final    Comment: (NOTE) KOH/Calcofluor preparation:  no fungus observed. Performed At: [facility] [place], [place] [id number] Lindon Romp MD TY:[id number]     Studies/Results: Ct Head Wo Contrast  Result Date: 09/25/2016 CLINICAL DATA:  Gaze palsy.  Seizure like activity. EXAM: CT HEAD WITHOUT CONTRAST TECHNIQUE: Contiguous axial images were obtained from the base of the skull through the vertex without intravenous contrast. 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 The punctate acute pontine infarct on MRI is also not apparent on CT. There is no evidence of acute cortical infarct, intracranial hemorrhage, mass, midline shift, or extra-axial fluid collection. The ventricles and sulci are normal.  Vascular: No hyperdense vessel or unexpected calcification. Skull: No fracture or osseous lesion. Sinuses/Orbits: Mild paranasal sinus mucosal thickening and small mastoid effusions. Unremarkable orbits. Other: None. IMPRESSION: No evidence of acute intracranial abnormality. Punctate pontine infarct on MRI is not apparent on CT. Electronically Signed   By: Logan Bores M.D.   On: 09/25/2016 10:56   Dg Chest Port 1 View  Result Date: 09/26/2016 CLINICAL DATA:  Followup right-sided infiltrate EXAM: PORTABLE CHEST 1 VIEW COMPARISON:  09/25/2016 FINDINGS: Cardiac shadow is stable but somewhat obscured by a basilar infiltrates. Left-sided PICC line and tracheostomy tube are again noted and stable. There is some persistent increased density in the medial aspect of the left lung base. Additionally consolidation in the right base is noted stable from the prior exam. No pneumothorax is seen. No bony abnormality is noted. IMPRESSION: Stable consolidation in the right base with increasing infiltrate in the medial left base. Electronically Signed   By: Inez Catalina M.D.   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 ([place])   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([place])   History of pulmonary embolism   DVT (deep venous thrombosis) ([name])   Severe protein-calorie malnutrition ([name])   Dysphagia,  pharyngoesophageal phase   Decubitus ulcer of right perineal ischial region, stage 4 ([name])   Decubitus ulcer of left perineal ischial region, stage 4 ([name])   Sacral decubitus ulcer, stage IV ([name])   Multiple allergies   Recurrent UTI   Respiratory failure, chronic ([name])    Aspiration pneumonia of right lower lobe ([name])   Seizures ([name])   Anxiety state   Anemia of chronic disease   Hyponatremia   Encephalopathy   Other acute osteomyelitis, multiple sites ([place])   Lower GI bleed   Viridans streptococci infection   Coma of unknown cause ([name])   Septic shock ([name])   Anaphylactic syndrome   Acute hypoxemic respiratory failure ([name])   Enterococcal infection   Thrombocytopenia ([name])   Status epilepticus ([name])   Chronic respiratory failure with hypoxia ([name])   Acute respiratory acidosis    Jason [name] is a 30 y.o. 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  #1 Encephalopathy: Easily  multifactorial though his status epilepticus potential neurotoxic reaction to antibiotics seem at the top of the list,  other toxic metabolic effects potential anoxic brain injury. He does not have evidence of meningitis at present and we will not give antibiotics to treat for meningitis.   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   #3 Pelvic osteo with ESBL: holding off on therapy at present. Given what happened with his recent seizures I think this is a very difficult situation. Again I'm not anxious to start therapy for this organism and his pelvic osteomyelitis is not curable regardless.  I will sign off for now since his recovery from his neurological insult seems to be quite protracted.  Please do not hesitate to call us back if he starts showing evidence of living and systemic infection or once he  wakes up to consider options on forward.     LOS: 32 days   [name] 09/27/2016, 8:37 AM

## 2016-09-27 NOTE — Progress Notes (Signed)
Patient transferred from 2s05 to MRI and back with no complications.

## 2016-09-27 NOTE — Progress Notes (Signed)
Name: Jason [name] MRN: [id number] DOB: 1986/06/25    ADMISSION DATE:  08/29/2016 CONSULTATION DATE:  11/21  REFERRING MD :  Dr. Algis Liming ([facility])   CHIEF COMPLAINT:  Lethargy, vent mgmt   BRIEF PATIENT DESCRIPTION:   30 yo male developed altered mental status after receiving azactam in setting of chronic wound infections (sacrum/heels).  He has hx of C6 spine injury with quadriplegia, tracheostomy status.  SUBJECTIVE:  Agitated overnight with increase in RR but responded well to 0.5 mg of IV versed  VITAL SIGNS: BP 110/62   Pulse 74   Temp 97.6 F (36.4 C) (Oral)   Resp 18   Ht 5\' 8"  (1.727 m)   Wt 43.5 kg (96 lb)   SpO2 100%   BMI 14.60 kg/m   INTAKE/OUTPUT: I/O last 3 completed shifts: In: 4437.3 [I.V.:392.3; Other:80; NG/GT:2405; IV Piggyback:1560] Out: [id number]; [id number]  PHYSICAL EXAMINATION: General: Chronically ill appearing young male in NAD on vent HEENT: Tracheostomy clean, no drainage  Cardiovascular:  s1 s2 Regular rhythm Pulmonary: resps even non labored on vent, few scattered rhonchi  Abdomen: Soft. Normal bowel sounds. Non distended, colostomy, Peg clamped, TF on hold  Neurological: no response to verbal stimuli, pupils 3 mm =/R, sedate in an induced coma EXT:  RUE>LUE edema   CMP Latest Ref Rng & Units 09/27/2016 09/26/2016 09/25/2016  Glucose 65 - 99 mg/dL 105(H) 193(H) 193(H)  BUN 6 - 20 mg/dL 18 16 14   Creatinine 0.61 - 1.24 mg/dL 0.53(L) 0.48(L) 0.54(L)  Sodium 135 - 145 mmol/L 152(H) 147(H) 145  Potassium 3.5 - 5.1 mmol/L 4.0 4.2 2.8(L)  Chloride 101 - 111 mmol/L 129(H) 128(H) 124(H)  CO2 22 - 32 mmol/L 17(L) 15(L) 13(L)  Calcium 8.9 - 10.3 mg/dL 10.0 9.0 8.4(L)  Total Protein 6.5 - 8.1 g/dL - - -  Total Bilirubin 0.3 - 1.2 mg/dL - - -  Alkaline Phos 38 - 126 U/L - - -  AST 15 - 41 U/L - - -  ALT 17 - 63 U/L - - -    CBC Latest Ref Rng & Units 09/27/2016 09/26/2016 09/25/2016  WBC 4.0 - 10.5 K/uL 20.0(H) 17.6(H)  21.4(H)  Hemoglobin 13.0 - 17.0 g/dL 7.3(L) 7.9(L) 9.0(L)  Hematocrit 39.0 - 52.0 % 22.9(L) 24.3(L) 27.0(L)  Platelets 150 - 400 K/uL 108(L) 175 247    Imaging: Ct Head Wo Contrast  Result Date: 09/25/2016 CLINICAL DATA:  Gaze palsy.  Seizure like activity. EXAM: CT HEAD WITHOUT CONTRAST TECHNIQUE: Contiguous axial images were obtained from the base of the skull through the vertex without intravenous contrast. 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 The punctate acute pontine infarct on MRI is also not apparent on CT. There is no evidence of acute cortical infarct, intracranial hemorrhage, mass, midline shift, or extra-axial fluid collection. The ventricles and sulci are normal. Vascular: No hyperdense vessel or unexpected calcification. Skull: No fracture or osseous lesion. Sinuses/Orbits: Mild paranasal sinus mucosal thickening and small mastoid effusions. Unremarkable orbits. Other: None. IMPRESSION: No evidence of acute intracranial abnormality. Punctate pontine infarct on MRI is not apparent on CT. Electronically Signed   By: Logan Bores M.D.   On: 09/25/2016 10:56   Dg Chest Port 1 View  Result Date: 09/26/2016 CLINICAL DATA:  Followup right-sided infiltrate EXAM: PORTABLE CHEST 1 VIEW COMPARISON:  09/25/2016 FINDINGS: Cardiac shadow is stable but somewhat obscured by a basilar infiltrates. Left-sided PICC  line and tracheostomy tube are again noted and stable. There is some persistent increased density in the medial aspect of the left lung base. Additionally consolidation in the right base is noted stable from the prior exam. No pneumothorax is seen. No bony abnormality is noted. IMPRESSION: Stable consolidation in the right base with increasing infiltrate in the medial left base. Electronically Signed   By: Inez Catalina M.D.   On: 09/26/2016 09:09   SIGNIFICANT EVENTS: 11/22 - surgical bx sacral  bone/wound 12/04 - transfer to ICU with status epilepticus 12/07 - eeg remains c poor neurostatus 12/17 - No seizures overnight, versed drip at 30mg /hr, intermittent hypothermia, decreased PEG tube drainage 12/18 - cervical puncture >>  LINES: PICC LUE 12/15 >>  Trach (chronic).  STUDIES:  EEG 12/06 >> burst suppression MRI brain 12/09 >> acute infarct Lt paracentral mid pons 3 mm, chronic Rt cerebellar infarct, atrophy  MICROBIOLOGY:  Blood Ctx x3 11/19:  3/3 Streptococcus constellatus  Urine Ctx 11/19:  Acinetobacter & Enterococcus faecalis MRSA PCR 11/20:  Positive Sacral Bone Ctx 11/22:  ESBL Kleb pna, Enterococcus faecalis, strep constellatus Blood Ctx x2 11/23:  Negative  Tracheal Asp Ctx 11/23:  Klebsiella pneumoniae & Acinetobacter Tracheal Asp Ctx 11/24:  Klebsiella pneumoniae & Acinetobacter Tracheal Asp Ctx 11/30:  Pseudomonas aeruginosa & Stenotrophomonas   ASSESSMENT / PLAN:  Status epilepticus >> possibly related to Abx vs meningitis  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 CSF negative for infection C6 spine injury with quadriplegia. - AEDs per neurology - EEG reordered by neuro, defer to neuro - Versed drip off, PRN at this point - Hold further diureses for now  Acute on chronic respiratory failure. Tracheostomy status. - Full vent support --> wean when MS allows. - Defer trach change for now. - F/u CXR PRN. - Budesonide BID. - Hold further diureses for now  HCAP, recurrent UTI, osteomyelitis, sacral/heel wounds. - Monitor off Abx - ID following - WOC for dressing recommendations - Abx course complete per ID service  Non-anion gap Metabolic acidosis in setting of hyperchloremia  Hypomagenesemia  Hypokalemia - KVO IVF - F/u cmp in am   - Replace electrolytes as indicated - D/C lasix - Add free water 250 q6  Anemia of critical illness:  f/u CBC after transfusion 12/11 12/12 6.2->8.1 - Trend CBC. - Transfuse per ICU protocol.  Rt subclavian/brachial  DVT 2nd to PICC line 11/27. 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 Chronic diastolic CHF. Hypotension 2nd to sedation. - Pressors to keep MAP > 55 - Midodrine - IJ changed to PICC 12/15 - Restarted heparin drip for PE 12/20  Severe protein calorie malnutrition. DM gastroparesis. - Resume tube feeds.  DM type 1. - SSI - KVO IVF  [place] - No family bedside 12/21.   The patient is critically ill with multiple organ systems failure and requires high complexity decision making for assessment and support, frequent evaluation and titration of therapies, application of advanced monitoring technologies and extensive interpretation of multiple databases.   Critical Care Time devoted to patient care services described in this note is  35  Minutes. This time reflects time of care of this signee Dr Jennet Maduro. This critical care time does not reflect procedure time, or teaching time or supervisory time of PA/NP/Med student/Med Resident etc but could involve care discussion time.  Rush Farmer, M.D. [facility] Pulmonary/Critical Care Medicine. Pager: [phone]. After hours pager: [phone].

## 2016-09-28 DIAGNOSIS — I2699 Other pulmonary embolism without acute cor pulmonale: Secondary | ICD-10-CM

## 2016-09-28 LAB — GLUCOSE, CAPILLARY
GLUCOSE-CAPILLARY: 103 mg/dL — AB (ref 65–99)
GLUCOSE-CAPILLARY: 122 mg/dL — AB (ref 65–99)
GLUCOSE-CAPILLARY: 125 mg/dL — AB (ref 65–99)
GLUCOSE-CAPILLARY: 131 mg/dL — AB (ref 65–99)
GLUCOSE-CAPILLARY: 206 mg/dL — AB (ref 65–99)
Glucose-Capillary: 130 mg/dL — ABNORMAL HIGH (ref 65–99)
Glucose-Capillary: 196 mg/dL — ABNORMAL HIGH (ref 65–99)
Glucose-Capillary: 59 mg/dL — ABNORMAL LOW (ref 65–99)

## 2016-09-28 LAB — CSF CULTURE: CULTURE: NO GROWTH

## 2016-09-28 LAB — MAGNESIUM: Magnesium: 1.7 mg/dL (ref 1.7–2.4)

## 2016-09-28 LAB — CBC
HEMATOCRIT: 22.4 % — AB (ref 39.0–52.0)
HEMOGLOBIN: 7.2 g/dL — AB (ref 13.0–17.0)
MCH: 29.4 pg (ref 26.0–34.0)
MCHC: 32.1 g/dL (ref 30.0–36.0)
MCV: 91.4 fL (ref 78.0–100.0)
Platelets: 89 10*3/uL — ABNORMAL LOW (ref 150–400)
RBC: 2.45 MIL/uL — ABNORMAL LOW (ref 4.22–5.81)
RDW: 21.4 % — AB (ref 11.5–15.5)
WBC: 19.2 10*3/uL — ABNORMAL HIGH (ref 4.0–10.5)

## 2016-09-28 LAB — HEPARIN LEVEL (UNFRACTIONATED)
HEPARIN UNFRACTIONATED: 0.24 [IU]/mL — AB (ref 0.30–0.70)
Heparin Unfractionated: <0.1 IU/mL — ABNORMAL LOW (ref 0.30–0.70)
Heparin Unfractionated: 0.16 IU/mL — ABNORMAL LOW (ref 0.30–0.70)

## 2016-09-28 LAB — BASIC METABOLIC PANEL
ANION GAP: 6 (ref 5–15)
BUN: 22 mg/dL — ABNORMAL HIGH (ref 6–20)
CALCIUM: 9.9 mg/dL (ref 8.9–10.3)
CHLORIDE: 130 mmol/L — AB (ref 101–111)
CO2: 16 mmol/L — AB (ref 22–32)
CREATININE: 0.56 mg/dL — AB (ref 0.61–1.24)
GFR calc Af Amer: >60 mL/min (ref >60)
GFR calc non Af Amer: >60 mL/min (ref >60)
GLUCOSE: 60 mg/dL — AB (ref 65–99)
Potassium: 3.3 mmol/L — ABNORMAL LOW (ref 3.5–5.1)
Sodium: 152 mmol/L — ABNORMAL HIGH (ref 135–145)

## 2016-09-28 LAB — AMMONIA: AMMONIA: 23 umol/L (ref 9–35)

## 2016-09-28 LAB — PHOSPHORUS: Phosphorus: 2.7 mg/dL (ref 2.5–4.6)

## 2016-09-28 LAB — VALPROIC ACID LEVEL: VALPROIC ACID LVL: 59 ug/mL (ref 50.0–100.0)

## 2016-09-28 MED ORDER — INSULIN ASPART 100 UNIT/ML ~~LOC~~ SOLN
1.0000 [IU] | SUBCUTANEOUS | Status: DC
  Administered 2016-09-28: 2 [IU] via SUBCUTANEOUS
  Administered 2016-09-28: 3 [IU] via SUBCUTANEOUS
  Administered 2016-09-29 (×4): 2 [IU] via SUBCUTANEOUS
  Administered 2016-09-29: 3 [IU] via SUBCUTANEOUS
  Administered 2016-09-29: 2 [IU] via SUBCUTANEOUS
  Administered 2016-09-29 – 2016-09-30 (×3): 1 [IU] via SUBCUTANEOUS
  Administered 2016-09-30: 2 [IU] via SUBCUTANEOUS
  Administered 2016-09-30 (×2): 1 [IU] via SUBCUTANEOUS
  Administered 2016-10-01: 3 [IU] via SUBCUTANEOUS
  Administered 2016-10-01: 1 [IU] via SUBCUTANEOUS
  Administered 2016-10-01 – 2016-10-02 (×3): 2 [IU] via SUBCUTANEOUS
  Administered 2016-10-02: 3 [IU] via SUBCUTANEOUS
  Administered 2016-10-02: 1 [IU] via SUBCUTANEOUS
  Administered 2016-10-03 (×3): 3 [IU] via SUBCUTANEOUS

## 2016-09-28 MED ORDER — POTASSIUM CHLORIDE 20 MEQ/15ML (10%) PO SOLN
40.0000 meq | Freq: Three times a day (TID) | ORAL | Status: AC
  Administered 2016-09-28 (×2): 40 meq
  Filled 2016-09-28 (×2): qty 30

## 2016-09-28 MED ORDER — MAGNESIUM SULFATE 2 GM/50ML IV SOLN
2.0000 g | Freq: Once | INTRAVENOUS | Status: AC
  Administered 2016-09-28: 2 g via INTRAVENOUS
  Filled 2016-09-28: qty 50

## 2016-09-28 MED ORDER — DEXTROSE 50 % IV SOLN
INTRAVENOUS | Status: AC
  Administered 2016-09-28: 50 mL
  Filled 2016-09-28: qty 50

## 2016-09-28 MED ORDER — HEPARIN BOLUS VIA INFUSION
1500.0000 [IU] | Freq: Once | INTRAVENOUS | Status: AC
  Administered 2016-09-28: 1500 [IU] via INTRAVENOUS
  Filled 2016-09-28: qty 1500

## 2016-09-28 NOTE — Progress Notes (Signed)
ANTICOAGULATION CONSULT NOTE - Follow Up Consult  Pharmacy Consult for Heparin Indication: DVT, h/o PE  Allergies  Allergen Reactions  . Amikacin Sulfate Other (See Comments)    Seizure   . Cefuroxime Axetil Anaphylaxis    Tolerated cefepime 12/2015 - with Pepcid/Solu-Medrol  . Imipenem Other (See Comments)    SEIZURES, ? DOSE RELATED ?  Marland Kitchen Penicillins Anaphylaxis and Other (See Comments)    Tolerated Imipenem; no reaction to 7 day course of amoxicillin in 2015 Has patient had a PCN reaction causing immediate rash, facial/tongue/throat swelling, SOB or lightheadedness with hypotension: Yes Has patient had a PCN reaction causing severe rash involving mucus membranes or skin necrosis: No Has patient had a PCN reaction that required hospitalization Yes Has patient had a PCN reaction occurring within the last 10 years: No If all of the above answers are "NO", then may proceed with Cephalo  . Sulfa Antibiotics Anaphylaxis and Shortness Of Breath  . Tessalon [Benzonatate] Anaphylaxis  . Aztreonam Swelling    FACIAL AND NECK SWELLING WITHOUT PREMEDICATION.  . Ertapenem Rash and Other (See Comments)    CONFUSION 'IMIPENEM RESPONSIBLE FOR SEIZURES'  . Levaquin [Levofloxacin In D5w] Swelling    Hand and forearm swelling  . Meropenem Other (See Comments)    ALTERED MENTAL STATUS, CONFUSION 'IMIPENEM RESPONSIBLE FOR SEIZURES'  . Morphine And Related Other (See Comments)    ALTERED MENTAL STATUS CONFUSION, HEADACHES VISUAL HALLUCINATIONS  . Nsaids Itching and Other (See Comments)    Risk of bleeding, itching  . Shellfish Allergy Itching    Took benadryl to alleviate reaction  . Rocephin [Ceftriaxone] Other (See Comments)    Confusion    Patient Measurements: Height: 5\' 8"  (172.7 cm) Weight: 97 lb (44 kg) IBW/kg (Calculated) : 68.4 Heparin Dosing Weight: 44 kg  Vital Signs: Temp: 97.9 F (36.6 C) (12/22 0801) Temp Source: Axillary (12/22 0801) BP: 109/58 (12/22 0741) Pulse  Rate: 94 (12/22 0741)  Labs:  Recent Labs  09/26/16 0450  09/27/16 0500 09/27/16 0920 09/27/16 1620 09/28/16 0507 09/28/16 0530  HGB 7.9*  --  7.3*  --   --   --  7.2*  HCT 24.3*  --  22.9*  --   --   --  22.4*  PLT 175  --  108*  --   --   --  89*  HEPARINUNFRC  --   < >  --  0.22* 0.15* <0.10*  --   CREATININE 0.48*  --  0.53*  --   --   --  0.56*  < > = values in this interval not displayed.  Estimated Creatinine Clearance: 82.5 mL/min (by C-G formula based on SCr of 0.56 mg/dL (L)).   Assessment:  Anticoag: PE hx 02/2015 s/p IVC filter.  NEW DVT in right UE 11/27 and again 12/5 doppler,stopped UFH 2nd low PLTC (37 on 11/27) and bleeding PEG site. 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 Noted multiple blood transfusions recently. Hgb down to 7.2. Plts down again 89 today. HL 0.22>0.15> to <0.1. No problems with line or infusion per RN. - Foot stick for levels, DVT in R arm, PICC in L arm/no levels  Goal of Therapy:  Heparin level 0.3-0.7 units/ml Monitor platelets by anticoagulation protocol: Yes   Plan:  Heparin 1500 unit IV bolus, increase infusion to 1150 units/hr Recheck HL in 6 hrs Daily heparin level and CBC  Cherokee Clowers S. Alford Highland, PharmD, [place] Clinical Staff Pharmacist Pager [phone]  Alford Highland, [employer] 09/28/2016,8:14 AM

## 2016-09-28 NOTE — Progress Notes (Signed)
ANTICOAGULATION CONSULT NOTE - Follow Up Consult  Pharmacy Consult for Heparin Indication: DVT, h/o PE  Allergies  Allergen Reactions  . Amikacin Sulfate Other (See Comments)    Seizure   . Cefuroxime Axetil Anaphylaxis    Tolerated cefepime 12/2015 - with Pepcid/Solu-Medrol  . Imipenem Other (See Comments)    SEIZURES, ? DOSE RELATED ?  Marland Kitchen Penicillins Anaphylaxis and Other (See Comments)    Tolerated Imipenem; no reaction to 7 day course of amoxicillin in 2015 Has patient had a PCN reaction causing immediate rash, facial/tongue/throat swelling, SOB or lightheadedness with hypotension: Yes Has patient had a PCN reaction causing severe rash involving mucus membranes or skin necrosis: No Has patient had a PCN reaction that required hospitalization Yes Has patient had a PCN reaction occurring within the last 10 years: No If all of the above answers are "NO", then may proceed with Cephalo  . Sulfa Antibiotics Anaphylaxis and Shortness Of Breath  . Tessalon [Benzonatate] Anaphylaxis  . Aztreonam Swelling    FACIAL AND NECK SWELLING WITHOUT PREMEDICATION.  . Ertapenem Rash and Other (See Comments)    CONFUSION 'IMIPENEM RESPONSIBLE FOR SEIZURES'  . Levaquin [Levofloxacin In D5w] Swelling    Hand and forearm swelling  . Meropenem Other (See Comments)    ALTERED MENTAL STATUS, CONFUSION 'IMIPENEM RESPONSIBLE FOR SEIZURES'  . Morphine And Related Other (See Comments)    ALTERED MENTAL STATUS CONFUSION, HEADACHES VISUAL HALLUCINATIONS  . Nsaids Itching and Other (See Comments)    Risk of bleeding, itching  . Shellfish Allergy Itching    Took benadryl to alleviate reaction  . Rocephin [Ceftriaxone] Other (See Comments)    Confusion    Patient Measurements: Height: 5\' 8"  (172.7 cm) Weight: 97 lb (44 kg) IBW/kg (Calculated) : 68.4 Heparin Dosing Weight: 44 kg  Vital Signs: Temp: 98.8 F (37.1 C) (12/22 1159) Temp Source: Oral (12/22 1159) BP: 112/66 (12/22 1300) Pulse Rate:  106 (12/22 1300)  Labs:  Recent Labs  09/26/16 0450  09/27/16 0500  09/27/16 1620 09/28/16 0507 09/28/16 0530 09/28/16 1413  HGB 7.9*  --  7.3*  --   --   --  7.2*  --   HCT 24.3*  --  22.9*  --   --   --  22.4*  --   PLT 175  --  108*  --   --   --  89*  --   HEPARINUNFRC  --   < >  --   < > 0.15* <0.10*  --  0.16*  CREATININE 0.48*  --  0.53*  --   --   --  0.56*  --   < > = values in this interval not displayed.  Estimated Creatinine Clearance: 82.5 mL/min (by C-G formula based on SCr of 0.56 mg/dL (L)).   Assessment:  Anticoag: PE hx 02/2015 s/p IVC filter.  NEW DVT in right UE 11/27 and again 12/5 doppler,stopped UFH 2nd low PLTC (37 on 11/27) and bleeding PEG site. 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 Noted multiple blood transfusions recently. Hgb down to 7.2. Plts down again 89 today. HL 0.22>0.15> to <0.1. No problems with line or infusion per RN. PM HL remains low at 0.16 - Foot stick for levels, DVT in R arm, PICC in L arm/no levels  Goal of Therapy:  Heparin level 0.3-0.7 units/ml Monitor platelets by anticoagulation protocol: Yes   Plan:  Heparin 1500 unit IV bolus, increase infusion to 1300 units/hr  Recheck HL in 6 hrs Daily heparin level and CBC  Erwin Nishiyama S. Alford Highland, PharmD, BCPS Clinical Staff Pharmacist Pager [phone]  [place], [place] 09/28/2016,3:06 PM

## 2016-09-28 NOTE — Progress Notes (Signed)
50 ml of vimpat wasted and witnessed by [employer]

## 2016-09-28 NOTE — Progress Notes (Signed)
Subjective: Some facial movements in response to stimuli per nursing.   Exam: Vitals:   09/28/16 1200 09/28/16 1300  BP: 114/63 112/66  Pulse: 91 (!) 106  Resp: (!) 25 (!) 26  Temp:     Gen: In bed, tracheostomy Resp: ventilated.  Abd: soft, nt  Neuro:  MS: does not respond, does not open eyes.  [id number] reactive, doll's eye intact, corneals present bilaterally.   Motor: no response to noxious stimuli.  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 Due to infectino in lumbar area, Tap was delayed and cervical puncture was pursued without evidence of infection.   Suspicion therefore has been geared more towards a toxic/metabolic issue and the option that has been foremost in my thinking is medication related given that he was on antibiotics which lower seizure threshold(imipenem)  His mother reports multiple episodes of decreased mental status, obtundation, etc. with beta-lactam antibiotics in the past and I wonder if he is somewhat sensitive to this Neurotoxicity reaction.   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 I have started anti-platelet therapy.   He had recurrent seizures on 12/10 and therefore was re-suppressed. Versed was stopped 12/17. It may take quite some time for him to wake up given how long the induced coma lasted, but I Was hoping to see more improvement by this point.     Cervical puncture(12/18) without evidence of infection. Elevated protein of unclear significance as this can be seen in many disorders, including status epilepticus. Repeat MRI 12/21 with no evidence of dysfunction.   With presumed cause for seizure no longer present, will begin to wean other meds.   Recommendations: 1) continue depakote 1gm Q6H 2) continue keppra 1500mg  BID 3) discontinue vimpat.  4) continue topamax 100mg    5) ammonia    Roland Rack, MD [facility] [phone]  If 7pm- 7am, please page neurology on call as listed in [place]. 09/28/2016  1:21 PM

## 2016-09-28 NOTE — Progress Notes (Signed)
Name: Jason [name] MRN: [id number] DOB: [birth year]    ADMISSION DATE:  08/08/2016 CONSULTATION DATE:  11/21  REFERRING MD :  Dr. Algis Liming ([facility])   CHIEF COMPLAINT:  Lethargy, vent mgmt   BRIEF PATIENT DESCRIPTION:   30 yo male developed altered mental status after receiving azactam in setting of chronic wound infections (sacrum/heels).  He has hx of C6 spine injury with quadriplegia, tracheostomy status.  SUBJECTIVE:  Remains lethargic , minimally responsive  VITAL SIGNS: BP (!) 109/58   Pulse 94   Temp 97.9 F (36.6 C) (Axillary)   Resp (!) 21   Ht 5\' 8"  (1.727 m)   Wt 97 lb (44 kg)   SpO2 100%   BMI 14.75 kg/m   INTAKE/OUTPUT: I/O last 3 completed shifts: In: 4279.5 [I.V.:619.5; Other:390; NG/GT:2340; IV Piggyback:930] Out: [id number] [Urine:5175; [id number]  PHYSICAL EXAMINATION: General: Chronically ill appearing young male in NAD trached  on vent HEENT: Tracheostomy clean, no drainage  Cardiovascular:  s1 s2 Regular rhythm Pulmonary: resps even non labored on vent, few scattered rhonchi  Abdomen: Soft. Normal bowel sounds. Non distended, colostomy, Peg clamped, TF on hold  Neurological: no response to verbal stimuli, pupils 3 mm =/R, doll's eyes intact , mild extension to noxious stimuli EXT:  RUE>LUE edema   CMP Latest Ref Rng & Units 09/28/2016 09/27/2016 09/26/2016  Glucose 65 - 99 mg/dL 60(L) 105(H) 193(H)  BUN 6 - 20 mg/dL 22(H) 18 16  Creatinine 0.61 - 1.24 mg/dL 0.56(L) 0.53(L) 0.48(L)  Sodium 135 - 145 mmol/L 152(H) 152(H) 147(H)  Potassium 3.5 - 5.1 mmol/L 3.3(L) 4.0 4.2  Chloride 101 - 111 mmol/L 130(H) 129(H) 128(H)  CO2 22 - 32 mmol/L 16(L) 17(L) 15(L)  Calcium 8.9 - 10.3 mg/dL 9.9 10.0 9.0  Total Protein 6.5 - 8.1 g/dL - - -  Total Bilirubin 0.3 - 1.2 mg/dL - - -  Alkaline Phos 38 - 126 U/L - - -  AST 15 - 41 U/L - - -  ALT 17 - 63 U/L - - -    CBC Latest Ref Rng & Units 09/28/2016 09/27/2016 09/26/2016  WBC 4.0 - 10.5 K/uL 19.2(H)  20.0(H) 17.6(H)  Hemoglobin 13.0 - 17.0 g/dL 7.2(L) 7.3(L) 7.9(L)  Hematocrit 39.0 - 52.0 % 22.4(L) 22.9(L) 24.3(L)  Platelets 150 - 400 K/uL 89(L) 108(L) 175    Imaging: Mr Jeri Cos Wo Contrast  Result Date: 09/28/2016 CLINICAL DATA:  Initial evaluation for acute seizure. EXAM: MRI HEAD WITHOUT AND WITH CONTRAST TECHNIQUE: Multiplanar, multiecho pulse sequences of the brain and surrounding structures were obtained without and with intravenous contrast. CONTRAST:  9 cc of MultiHance COMPARISON:  Prior CT from 09/25/2016 and previous MRI from 09/15/2016. FINDINGS: Brain: Cerebral volume is stable. Minimal T2/FLAIR hyperintensity within the periventricular white matter noted, likely related to chronic small vessel ischemic disease. This is stable from prior. Small punctate focus of diffusion abnormality again noted within the left paramedian pons, slightly decreased in intensity as compared to previous study. No other diffusion abnormality to suggest acute or subacute ischemia. Gray-white matter differentiation otherwise maintained. Small chronic right cerebellar infarcts again noted. No other evidence for chronic infarction. No susceptibility artifact to suggest acute or chronic intracranial hemorrhage. No mass lesion, midline shift, or mass effect. Ventricles are stable in size without hydrocephalus. No extra-axial fluid collection. Major dural sinuses are grossly patent. No abnormal enhancement. No intrinsic temporal lobe abnormality. Pituitary gland within normal limits.  Midline structures intact. Vascular: Normal intravascular flow voids are  maintained. Skull and upper cervical spine: Cerebellar tonsils minimally low lying within the foramen magnum by 2-3 mm without Chiari malformation. Craniocervical junction otherwise unremarkable. Bone marrow signal intensity somewhat diffusely decreased on T1 weighted signal intensity, most commonly related to anemia, smoking, or obesity. No scalp soft tissue  abnormality. Sinuses/Orbits: Globes and orbital soft tissues within normal limits. Scattered mucosal thickening throughout the paranasal sinuses. Fluid within the nasopharynx. Scattered bilateral mastoid effusions. Patient likely intubated. IMPRESSION: 1. Persistent punctate diffusion abnormality at previously identified small left paramedian pontine infarct. 2. No other new acute intracranial process. 3. Small chronic right cerebellar infarcts, stable. Electronically Signed   By: Jeannine Boga M.D.   On: 09/28/2016 00:39   SIGNIFICANT EVENTS: 11/22 - surgical bx sacral bone/wound 12/04 - transfer to ICU with status epilepticus 12/07 - eeg remains c poor neurostatus 12/17 - No seizures overnight, versed drip at 30mg /hr, intermittent hypothermia, decreased PEG tube drainage 12/18 - cervical puncture >>  LINES: PICC LUE 12/15 >>  Trach (chronic).  STUDIES:  EEG 12/06 >> burst suppression MRI brain 12/09 >> acute infarct Lt paracentral mid pons 3 mm, chronic Rt cerebellar infarct, atrophy MRI Brain 12/22>>persistant punctate diffusion abnormality at previously noted small left paramedian pontine infarct, small chronic right cerebellar infarcts, stable.  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 CSF negative for infection C6 spine injury with quadriplegia. - AEDs per neurology - EEG reordered by neuro, defer to neuro - Versed drip off,  PRN at this point - Hold further diureses for now  Acute on chronic respiratory failure. Tracheostomy status. Not following commands 12/22 - Full vent support --> wean when MS allows. - Defer trach change for now. - F/u CXR PRN. - Hold further diureses for now ( Net neg 4 L)  HCAP, recurrent UTI, osteomyelitis, sacral/heel wounds. - Monitor off Abx - ID following - WOC for dressing recommendations - Abx course complete per ID service - DG chest 12/23 ( increasing infiltrate medial L base 12/20)  Non-anion gap Metabolic acidosis in setting of hyperchloremia  Hypomagenesemia  Hypokalemia - KVO IVF - F/u cmp 12/22  - Replace electrolytes as indicated - D/C lasix - Continue free water 250 q6  Anemia of critical illness Leukocytosis:   f/u CBC after transfusion 12/11 12/12 6.2->8.1 - Trend CBC/ fever curve - Transfuse per ICU protocol for hgb <7  Rt subclavian/brachial DVT 2nd to PICC line 11/27. 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 Chronic diastolic CHF. Hypotension 2nd to sedation. - Pressors to keep MAP > 55 - Midodrine - IJ changed to PICC 12/15 - Restarted heparin drip for PE 12/20  Severe protein calorie malnutrition. DM gastroparesis. - tube feeds at goal.  DM type 1. Hypoglycemic episodes 12/22 - SSI, will change to sensitive scale - KVO IVF  GOC - No family bedside 12/22.   Magdalen Spatz, AGACNP-BC [facility] Pager # [phone] 09/28/2016  Attending Note:  30 year old male quad with status epilepticus who is in a drug induced coma to control seizures.  On exam, he is unresponsive. Remains on some sedation.  Will stop the topamax today.  Minimize sedation.  I reviewed CXR myself, trach in good position.  Neurology is championing the EEG/anti-epileptic effort.  MRI again today.  LP was negative.  Continue current care and follow neurology's lead.  Will check  ABG and adjust vent accordingly.  Hold weaning at this time.  The patient is critically ill with multiple organ systems failure and requires high complexity decision making for assessment and support, frequent evaluation and titration of therapies, application of advanced monitoring technologies and extensive interpretation of multiple databases.   Critical Care Time devoted to patient care services described in this note is  35  Minutes. This time reflects time of care of this signee Dr Jennet Maduro. This critical care time does not reflect procedure time, or teaching time or supervisory time of PA/NP/Med student/Med Resident etc but could involve care discussion time.  Rush Farmer, M.D. [facility] Pulmonary/Critical Care Medicine. Pager: [phone]. After hours pager: [phone].

## 2016-09-29 ENCOUNTER — Inpatient Hospital Stay (HOSPITAL_COMMUNITY): Payer: Medicaid Other

## 2016-09-29 LAB — BASIC METABOLIC PANEL
Anion gap: 8 (ref 5–15)
BUN: 23 mg/dL — AB (ref 6–20)
CALCIUM: 9.7 mg/dL (ref 8.9–10.3)
CO2: 13 mmol/L — ABNORMAL LOW (ref 22–32)
Chloride: 127 mmol/L — ABNORMAL HIGH (ref 101–111)
Creatinine, Ser: 0.47 mg/dL — ABNORMAL LOW (ref 0.61–1.24)
GFR calc Af Amer: >60 mL/min (ref >60)
GLUCOSE: 216 mg/dL — AB (ref 65–99)
POTASSIUM: 3.7 mmol/L (ref 3.5–5.1)
Sodium: 148 mmol/L — ABNORMAL HIGH (ref 135–145)

## 2016-09-29 LAB — GLUCOSE, CAPILLARY
GLUCOSE-CAPILLARY: 181 mg/dL — AB (ref 65–99)
GLUCOSE-CAPILLARY: 205 mg/dL — AB (ref 65–99)
GLUCOSE-CAPILLARY: 153 mg/dL — AB (ref 65–99)
GLUCOSE-CAPILLARY: 170 mg/dL — AB (ref 65–99)
Glucose-Capillary: 197 mg/dL — ABNORMAL HIGH (ref 65–99)
Glucose-Capillary: 199 mg/dL — ABNORMAL HIGH (ref 65–99)

## 2016-09-29 LAB — PREPARE RBC (CROSSMATCH)

## 2016-09-29 LAB — CBC
HCT: 21.6 % — ABNORMAL LOW (ref 39.0–52.0)
Hemoglobin: 7 g/dL — ABNORMAL LOW (ref 13.0–17.0)
MCH: 29.7 pg (ref 26.0–34.0)
MCHC: 32.4 g/dL (ref 30.0–36.0)
MCV: 91.5 fL (ref 78.0–100.0)
PLATELETS: 50 10*3/uL — AB (ref 150–400)
RBC: 2.36 MIL/uL — ABNORMAL LOW (ref 4.22–5.81)
RDW: 21.5 % — AB (ref 11.5–15.5)
WBC: 19.2 10*3/uL — ABNORMAL HIGH (ref 4.0–10.5)

## 2016-09-29 LAB — HEPARIN LEVEL (UNFRACTIONATED): Heparin Unfractionated: 0.31 IU/mL (ref 0.30–0.70)

## 2016-09-29 LAB — APTT
APTT: 184 s — AB (ref 24–36)
aPTT: >200 seconds (ref 24–36)

## 2016-09-29 LAB — PHOSPHORUS: Phosphorus: 1.8 mg/dL — ABNORMAL LOW (ref 2.5–4.6)

## 2016-09-29 LAB — MAGNESIUM: Magnesium: 2.1 mg/dL (ref 1.7–2.4)

## 2016-09-29 MED ORDER — ARGATROBAN 50 MG/50ML IV SOLN
0.5000 ug/kg/min | INTRAVENOUS | Status: DC
  Filled 2016-09-29: qty 50

## 2016-09-29 MED ORDER — INSULIN GLARGINE 100 UNIT/ML ~~LOC~~ SOLN
10.0000 [IU] | Freq: Every day | SUBCUTANEOUS | Status: DC
  Administered 2016-09-29 – 2016-09-30 (×2): 10 [IU] via SUBCUTANEOUS
  Filled 2016-09-29 (×2): qty 0.1

## 2016-09-29 MED ORDER — IMMUNE GLOBULIN (HUMAN) 20 GM/200ML IV SOLN
20.0000 g | INTRAVENOUS | Status: AC
  Administered 2016-09-29 – 2016-10-03 (×3): 20 g via INTRAVENOUS
  Filled 2016-09-29 (×3): qty 200

## 2016-09-29 MED ORDER — IMMUNE GLOBULIN (HUMAN) 10 GM/100ML IV SOLN: 400.0000 mg/kg | INTRAVENOUS | Status: DC

## 2016-09-29 MED ORDER — BIVALIRUDIN 250 MG IV SOLR
0.0700 mg/kg/h | INTRAVENOUS | Status: DC
  Filled 2016-09-29: qty 250

## 2016-09-29 MED ORDER — SODIUM CHLORIDE 0.9 % IV SOLN
Freq: Once | INTRAVENOUS | Status: AC
  Administered 2016-09-29: 1 mL via INTRAVENOUS

## 2016-09-29 MED ORDER — POTASSIUM PHOSPHATES 15 MMOLE/5ML IV SOLN
30.0000 mmol | Freq: Once | INTRAVENOUS | Status: AC
  Administered 2016-09-29: 30 mmol via INTRAVENOUS
  Filled 2016-09-29: qty 10

## 2016-09-29 MED ORDER — IMMUNE GLOBULIN (HUMAN) 5 GM/50ML IV SOLN
15.0000 g | INTRAVENOUS | Status: AC
  Administered 2016-09-30 – 2016-10-02 (×2): 15 g via INTRAVENOUS
  Filled 2016-09-29 (×2): qty 50

## 2016-09-29 MED ORDER — HEPARIN BOLUS VIA INFUSION
1500.0000 [IU] | Freq: Once | INTRAVENOUS | Status: AC
  Administered 2016-09-29: 1500 [IU] via INTRAVENOUS
  Filled 2016-09-29: qty 1500

## 2016-09-29 MED ORDER — SODIUM CHLORIDE 0.9 % IV SOLN
0.1500 mg/kg/h | INTRAVENOUS | Status: DC
  Administered 2016-09-29: 0.15 mg/kg/h via INTRAVENOUS
  Filled 2016-09-29: qty 250

## 2016-09-29 MED ORDER — SODIUM CHLORIDE 0.9 % IV SOLN
500.0000 mg | Freq: Two times a day (BID) | INTRAVENOUS | Status: DC
  Administered 2016-09-29 – 2016-10-04 (×10): 500 mg via INTRAVENOUS
  Filled 2016-09-29 (×11): qty 4

## 2016-09-29 NOTE — Progress Notes (Addendum)
ANTICOAGULATION CONSULT NOTE - Follow Up Consult  Pharmacy Consult for Bivalirudin Indication: DVT, h/o PE  Allergies  Allergen Reactions  . Amikacin Sulfate Other (See Comments)    Seizure   . Cefuroxime Axetil Anaphylaxis    Tolerated cefepime 12/2015 - with Pepcid/Solu-Medrol  . Imipenem Other (See Comments)    SEIZURES, ? DOSE RELATED ?  Marland Kitchen Penicillins Anaphylaxis and Other (See Comments)    Tolerated Imipenem; no reaction to 7 day course of amoxicillin in 2015 Has patient had a PCN reaction causing immediate rash, facial/tongue/throat swelling, SOB or lightheadedness with hypotension: Yes Has patient had a PCN reaction causing severe rash involving mucus membranes or skin necrosis: No Has patient had a PCN reaction that required hospitalization Yes Has patient had a PCN reaction occurring within the last 10 years: No If all of the above answers are "NO", then may proceed with Cephalo  . Sulfa Antibiotics Anaphylaxis and Shortness Of Breath  . Tessalon [Benzonatate] Anaphylaxis  . Aztreonam Swelling    FACIAL AND NECK SWELLING WITHOUT PREMEDICATION.  . Ertapenem Rash and Other (See Comments)    CONFUSION 'IMIPENEM RESPONSIBLE FOR SEIZURES'  . Levaquin [Levofloxacin In D5w] Swelling    Hand and forearm swelling  . Meropenem Other (See Comments)    ALTERED MENTAL STATUS, CONFUSION 'IMIPENEM RESPONSIBLE FOR SEIZURES'  . Morphine And Related Other (See Comments)    ALTERED MENTAL STATUS CONFUSION, HEADACHES VISUAL HALLUCINATIONS  . Nsaids Itching and Other (See Comments)    Risk of bleeding, itching  . Shellfish Allergy Itching    Took benadryl to alleviate reaction  . Rocephin [Ceftriaxone] Other (See Comments)    Confusion    Patient Measurements: Height: 5\' 8"  (172.7 cm) Weight: 95 lb 10.9 oz (43.4 kg) IBW/kg (Calculated) : 68.4 Heparin Dosing Weight: 44 kg  Vital Signs: Temp: 93.4 F (34.1 C) (12/23 1245) Temp Source: Rectal (12/23 1245) BP: 115/61 (12/23  1245) Pulse Rate: 87 (12/23 1245)  Labs:  Recent Labs  09/27/16 0500  09/28/16 0530 09/28/16 1413 09/28/16 2158 09/29/16 0500 09/29/16 0927  HGB 7.3*  --  7.2*  --   --  7.0*  --   HCT 22.9*  --  22.4*  --   --  21.6*  --   PLT 108*  --  89*  --   --  50*  --   HEPARINUNFRC  --   < >  --  0.16* 0.24*  --  0.31  CREATININE 0.53*  --  0.56*  --   --  0.47*  --   < > = values in this interval not displayed.  Estimated Creatinine Clearance: 81.4 mL/min (by C-G formula based on SCr of 0.47 mg/dL (L)).   Assessment:  Anticoag: PE hx 02/2015 s/p IVC filter.  NEW DVT in right UE 11/27 from PICC line and confirmed 12/5 doppler,stopped UFH 2nd low PLTC (37 on 11/27) and bleeding PEG site. 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 Noted multiple blood transfusions recently. Hgb down to 7. Plts down again 50. HL 0.31. Spoke with Dr. Elsworth Soho who was ok changing to Bivalirudin - Foot stick for levels, DVT in R arm, PICC in L arm/no levels   Goal of Therapy:  Heparin level 0.3-0.7 units/ml Monitor platelets by anticoagulation protocol: Yes   Plan:  Bivalirudin 0.15 mg/kg/hr. APTT in 2 hrs. Check HIT antibody Hold ASA?    Dorman Calderwood S. Alford Highland, PharmD, [place] Clinical Staff Pharmacist Pager [phone]  Alford Highland,  Austin Herd Stillinger 09/29/2016,1:31 PM

## 2016-09-29 NOTE — Progress Notes (Signed)
Subjective: No changes  Exam: Vitals:   09/29/16 1400 09/29/16 1500  BP: (!) 88/56 (!) 85/54  Pulse: 97 100  Resp: (!) 31 (!) 35  Temp:     Gen: In bed, tracheostomy Resp: ventilated.  Abd: soft, nt  Neuro:  MS: does not respond, does not open eyes.  [id number] reactive, doll's eye intact, corneals present bilaterally.   Motor: no response to noxious stimuli.  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 Due to infection in lumbar area, Tap was delayed and cervical puncture was pursued without evidence of infection.   Suspicion therefore has been geared more towards a toxic/metabolic issue and the option that has been foremost in my thinking is medication related given that he was on antibiotics which lower seizure threshold(imipenem)  His mother reports multiple episodes of decreased mental status, obtundation, etc. with beta-lactam antibiotics in the past and I wonder if he is somewhat sensitive to this Neurotoxicity reaction.   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 I have started anti-platelet therapy.   He had recurrent seizures on 12/10 and therefore was re-suppressed. Versed was stopped 12/17. It may take quite some time for him to wake up given how long the induced coma lasted, but I Was hoping to see more improvement by this point.     Cervical puncture(12/18) without evidence of infection. Elevated protein of unclear significance as this can be seen in many disorders, including status epilepticus. Repeat MRI 12/21 with no evidence of dysfunction. Repeat continuous EEG 12/19-12/20 without evidence of ongiong seizure activity.   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  7) will follow.     Roland Rack, MD [facility] [phone]  If 7pm- 7am, please page neurology on call as listed in [place]. 09/29/2016  4:01 PM

## 2016-09-29 NOTE — Progress Notes (Addendum)
ANTICOAGULATION CONSULT NOTE - Follow Up Consult  Pharmacy Consult for Bivalirudin Indication: DVT, h/o PE  Allergies  Allergen Reactions  . Amikacin Sulfate Other (See Comments)    Seizure   . Cefuroxime Axetil Anaphylaxis    Tolerated cefepime 12/2015 - with Pepcid/Solu-Medrol  . Imipenem Other (See Comments)    SEIZURES, ? DOSE RELATED ?  Marland Kitchen Penicillins Anaphylaxis and Other (See Comments)    Tolerated Imipenem; no reaction to 7 day course of amoxicillin in 2015 Has patient had a PCN reaction causing immediate rash, facial/tongue/throat swelling, SOB or lightheadedness with hypotension: Yes Has patient had a PCN reaction causing severe rash involving mucus membranes or skin necrosis: No Has patient had a PCN reaction that required hospitalization Yes Has patient had a PCN reaction occurring within the last 10 years: No If all of the above answers are "NO", then may proceed with Cephalo  . Sulfa Antibiotics Anaphylaxis and Shortness Of Breath  . Tessalon [Benzonatate] Anaphylaxis  . Aztreonam Swelling    FACIAL AND NECK SWELLING WITHOUT PREMEDICATION.  . Ertapenem Rash and Other (See Comments)    CONFUSION 'IMIPENEM RESPONSIBLE FOR SEIZURES'  . Levaquin [Levofloxacin In D5w] Swelling    Hand and forearm swelling  . Meropenem Other (See Comments)    ALTERED MENTAL STATUS, CONFUSION 'IMIPENEM RESPONSIBLE FOR SEIZURES'  . Morphine And Related Other (See Comments)    ALTERED MENTAL STATUS CONFUSION, HEADACHES VISUAL HALLUCINATIONS  . Nsaids Itching and Other (See Comments)    Risk of bleeding, itching  . Shellfish Allergy Itching    Took benadryl to alleviate reaction  . Rocephin [Ceftriaxone] Other (See Comments)    Confusion    Patient Measurements: Height: 5\' 8"  (172.7 cm) Weight: 95 lb 10.9 oz (43.4 kg) IBW/kg (Calculated) : 68.4 Heparin Dosing Weight: 44 kg  Vital Signs: Temp: 103.2 F (39.6 C) (12/23 2115) Temp Source: Oral (12/23 2115) BP: 87/61 (12/23  2130) Pulse Rate: 130 (12/23 2130)  Labs:  Recent Labs  09/27/16 0500  09/28/16 0530 09/28/16 1413 09/28/16 2158 09/29/16 0500 09/29/16 0927 09/29/16 1501  HGB 7.3*  --  7.2*  --   --  7.0*  --   --   HCT 22.9*  --  22.4*  --   --  21.6*  --   --   PLT 108*  --  89*  --   --  50*  --   --   APTT  --   --   --   --   --   --   --  >200*  HEPARINUNFRC  --   < >  --  0.16* 0.24*  --  0.31  --   CREATININE 0.53*  --  0.56*  --   --  0.47*  --   --   < > = values in this interval not displayed.  Estimated Creatinine Clearance: 81.4 mL/min (by C-G formula based on SCr of 0.47 mg/dL (L)).   Assessment:  Anticoag: PE hx 02/2015 s/p IVC filter.  NEW DVT in right UE 11/27 from PICC line and confirmed 12/5 doppler,stopped UFH 2nd low PLTC (37 on 11/27) and bleeding PEG site. 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 Noted multiple blood transfusions recently. Hgb down to 7. Plts down again 50. HL 0.31. Spoke with Dr. Elsworth Soho who was ok changing to Bivalirudin. APTT >200 then 184. Rate running correctly, lab draws appropriate. Heparin infusion definitely stopped. - Foot stick for levels, DVT  in R arm, PICC in L arm/no levels   Goal of Therapy:  Heparin level 0.3-0.7 units/ml Monitor platelets by anticoagulation protocol: Yes   Plan:  Hold x 1 hr then decrease to Bivalirudin 0.07 mg/kg/hr. APTT in 2 hrs. Check HIT antibody Hold ASA?    Konnie Noffsinger S. Alford Highland, PharmD, BCPS Clinical Staff Pharmacist Pager [phone]  Eilene Ghazi Stillinger 09/29/2016,9:59 PM

## 2016-09-29 NOTE — Progress Notes (Signed)
MD aware of fever

## 2016-09-29 NOTE — Progress Notes (Signed)
ANTICOAGULATION CONSULT NOTE - Follow Up Consult  Pharmacy Consult for Heparin  Indication: pulmonary embolus and DVT   Patient Measurements: Height: 5\' 8"  (172.7 cm) Weight: 97 lb (44 kg) IBW/kg (Calculated) : 68.4  Vital Signs: Temp: 97.8 F (36.6 C) (12/23 0000) Temp Source: Oral (12/23 0000) BP: 105/60 (12/23 0000) Pulse Rate: 87 (12/23 0000)  Labs:  Recent Labs  09/26/16 0450  09/27/16 0500  09/28/16 0507 09/28/16 0530 09/28/16 1413 09/28/16 2158  HGB 7.9*  --  7.3*  --   --  7.2*  --   --   HCT 24.3*  --  22.9*  --   --  22.4*  --   --   PLT 175  --  108*  --   --  89*  --   --   HEPARINUNFRC  --   < >  --   < > <0.10*  --  0.16* 0.24*  CREATININE 0.48*  --  0.53*  --   --  0.56*  --   --   < > = values in this interval not displayed.  Estimated Creatinine Clearance: 82.5 mL/min (by C-G formula based on SCr of 0.56 mg/dL (L)).   Assessment: 54 YOM with hx of spinal cord injury, quadriplegia trach & peg dependant, admitted on 11/19 with sepsis. Found to have subclavian/brachial DVT 2nd to PICC line 11/27. Originally holding off anticoagulation d/t low hgb/pltc and PEG site bleeding. Pt has IVC filter. 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 Pharmacy is consul. Hgb is low but fairly stable. Heparin level remains low despite rate increases.   Goal of Therapy:  Heparin level 0.3-0.7 units/ml Monitor platelets by anticoagulation protocol: Yes   Plan:  -Heparin 1500 units re-bolus -Inc heparin to 1450 units/hr -0900 HL  Narda Bonds 09/29/2016,12:35 AM

## 2016-09-29 NOTE — Progress Notes (Signed)
ANTICOAGULATION CONSULT NOTE - Follow Up Consult  Pharmacy Consult for Heparin Indication: DVT, h/o PE  Allergies  Allergen Reactions  . Amikacin Sulfate Other (See Comments)    Seizure   . Cefuroxime Axetil Anaphylaxis    Tolerated cefepime 12/2015 - with Pepcid/Solu-Medrol  . Imipenem Other (See Comments)    SEIZURES, ? DOSE RELATED ?  Marland Kitchen Penicillins Anaphylaxis and Other (See Comments)    Tolerated Imipenem; no reaction to 7 day course of amoxicillin in 2015 Has patient had a PCN reaction causing immediate rash, facial/tongue/throat swelling, SOB or lightheadedness with hypotension: Yes Has patient had a PCN reaction causing severe rash involving mucus membranes or skin necrosis: No Has patient had a PCN reaction that required hospitalization Yes Has patient had a PCN reaction occurring within the last 10 years: No If all of the above answers are "NO", then may proceed with Cephalo  . Sulfa Antibiotics Anaphylaxis and Shortness Of Breath  . Tessalon [Benzonatate] Anaphylaxis  . Aztreonam Swelling    FACIAL AND NECK SWELLING WITHOUT PREMEDICATION.  . Ertapenem Rash and Other (See Comments)    CONFUSION 'IMIPENEM RESPONSIBLE FOR SEIZURES'  . Levaquin [Levofloxacin In D5w] Swelling    Hand and forearm swelling  . Meropenem Other (See Comments)    ALTERED MENTAL STATUS, CONFUSION 'IMIPENEM RESPONSIBLE FOR SEIZURES'  . Morphine And Related Other (See Comments)    ALTERED MENTAL STATUS CONFUSION, HEADACHES VISUAL HALLUCINATIONS  . Nsaids Itching and Other (See Comments)    Risk of bleeding, itching  . Shellfish Allergy Itching    Took benadryl to alleviate reaction  . Rocephin [Ceftriaxone] Other (See Comments)    Confusion    Patient Measurements: Height: 5\' 8"  (172.7 cm) Weight: 95 lb 10.9 oz (43.4 kg) IBW/kg (Calculated) : 68.4 Heparin Dosing Weight: 44 kg  Vital Signs: Temp: 97.9 F (36.6 C) (12/23 0759) Temp Source: Oral (12/23 0759) BP: 91/56 (12/23 0900) Pulse  Rate: 93 (12/23 0900)  Labs:  Recent Labs  09/27/16 0500  09/28/16 0530 09/28/16 1413 09/28/16 2158 09/29/16 0500 09/29/16 0927  HGB 7.3*  --  7.2*  --   --  7.0*  --   HCT 22.9*  --  22.4*  --   --  21.6*  --   PLT 108*  --  89*  --   --  50*  --   HEPARINUNFRC  --   < >  --  0.16* 0.24*  --  0.31  CREATININE 0.53*  --  0.56*  --   --  0.47*  --   < > = values in this interval not displayed.  Estimated Creatinine Clearance: 81.4 mL/min (by C-G formula based on SCr of 0.47 mg/dL (L)).   Assessment:  Anticoag: PE hx 02/2015 s/p IVC filter.  NEW DVT in right UE 11/27 from PICC line and confirmed 12/5 doppler,stopped UFH 2nd low PLTC (37 on 11/27) and bleeding PEG site. 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 Noted multiple blood transfusions recently. Hgb down to 7. Plts down again 50. HL 0.31. - Foot stick for levels, DVT in R arm, PICC in L arm/no levels   Goal of Therapy:  Heparin level 0.3-0.7 units/ml Monitor platelets by anticoagulation protocol: Yes   Plan:  Continue IV heparin at 1450 units/hr. Consider changing to Bivalirudin? Will discuss with MD. Check HIT antibody Hold ASA?    Roshini Fulwider S. Alford Highland, PharmD, [place] Clinical Staff Pharmacist Pager [phone]  [place], [place] 09/29/2016,11:02  AM

## 2016-09-29 NOTE — Progress Notes (Addendum)
Name: Jason [name] MRN: [id number] DOB: 1986-08-30    ADMISSION DATE:  08/25/2016 CONSULTATION DATE:  11/21  REFERRING MD :  Dr. Algis Liming ([facility])   CHIEF COMPLAINT:  Lethargy, vent mgmt   BRIEF PATIENT DESCRIPTION:   30 yo male developed altered mental status after receiving azactam in setting of chronic wound infections (sacrum/heels).  He has hx of C6 spine injury with quadriplegia, tracheostomy status.  SUBJECTIVE:  Remains critically ill  , on low dose neo gtt Poorly responsive Good UO afebrile   VITAL SIGNS: BP (!) 91/56   Pulse 93   Temp 97.9 F (36.6 C) (Oral)   Resp (!) 28   Ht 5\' 8"  (1.727 m)   Wt 95 lb 10.9 oz (43.4 kg)   SpO2 100%   BMI 14.55 kg/m   INTAKE/OUTPUT: I/O last 3 completed shifts: In: 4171.6 [I.V.:1181.6; Other:200; NG/GT:2590; IV Piggyback:200] Out: 5150 [Urine:2750; Stool:2400]  PHYSICAL EXAMINATION: General: Chronically ill appearing young male in NAD trached  on vent HEENT: Tracheostomy clean, no drainage  Cardiovascular:  s1 s2 Regular rhythm Pulmonary: resps even non labored on vent, few scattered rhonchi  Abdomen: Soft. Normal bowel sounds. Non distended, colostomy, Peg clamped, TF on hold  Neurological: no response to verbal stimuli, pupils 3 mm =/R, doll's eyes intact , mild extension to noxious stimuli EXT:  RUE>LUE edema   CMP Latest Ref Rng & Units 09/29/2016 09/28/2016 09/27/2016  Glucose 65 - 99 mg/dL 216(H) 60(L) 105(H)  BUN 6 - 20 mg/dL 23(H) 22(H) 18  Creatinine 0.61 - 1.24 mg/dL 0.47(L) 0.56(L) 0.53(L)  Sodium 135 - 145 mmol/L 148(H) 152(H) 152(H)  Potassium 3.5 - 5.1 mmol/L 3.7 3.3(L) 4.0  Chloride 101 - 111 mmol/L 127(H) 130(H) 129(H)  CO2 22 - 32 mmol/L 13(L) 16(L) 17(L)  Calcium 8.9 - 10.3 mg/dL 9.7 9.9 10.0  Total Protein 6.5 - 8.1 g/dL - - -  Total Bilirubin 0.3 - 1.2 mg/dL - - -  Alkaline Phos 38 - 126 U/L - - -  AST 15 - 41 U/L - - -  ALT 17 - 63 U/L - - -    CBC Latest Ref Rng & Units 09/29/2016  09/28/2016 09/27/2016  WBC 4.0 - 10.5 K/uL 19.2(H) 19.2(H) 20.0(H)  Hemoglobin 13.0 - 17.0 g/dL 7.0(L) 7.2(L) 7.3(L)  Hematocrit 39.0 - 52.0 % 21.6(L) 22.4(L) 22.9(L)  Platelets 150 - 400 K/uL 50(L) 89(L) 108(L)    Imaging: Mr Jeri Cos Wo Contrast  Result Date: 09/28/2016 CLINICAL DATA:  Initial evaluation for acute seizure. EXAM: MRI HEAD WITHOUT AND WITH CONTRAST TECHNIQUE: Multiplanar, multiecho pulse sequences of the brain and surrounding structures were obtained without and with intravenous contrast. CONTRAST:  9 cc of MultiHance COMPARISON:  Prior CT from 09/25/2016 and previous MRI from 09/15/2016. FINDINGS: Brain: Cerebral volume is stable. Minimal T2/FLAIR hyperintensity within the periventricular white matter noted, likely related to chronic small vessel ischemic disease. This is stable from prior. Small punctate focus of diffusion abnormality again noted within the left paramedian pons, slightly decreased in intensity as compared to previous study. No other diffusion abnormality to suggest acute or subacute ischemia. Gray-white matter differentiation otherwise maintained. Small chronic right cerebellar infarcts again noted. No other evidence for chronic infarction. No susceptibility artifact to suggest acute or chronic intracranial hemorrhage. No mass lesion, midline shift, or mass effect. Ventricles are stable in size without hydrocephalus. No extra-axial fluid collection. Major dural sinuses are grossly patent. No abnormal enhancement. No intrinsic temporal lobe abnormality. Pituitary gland  within normal limits.  Midline structures intact. Vascular: Normal intravascular flow voids are maintained. Skull and upper cervical spine: Cerebellar tonsils minimally low lying within the foramen magnum by 2-3 mm without Chiari malformation. Craniocervical junction otherwise unremarkable. Bone marrow signal intensity somewhat diffusely decreased on T1 weighted signal intensity, most commonly related to  anemia, smoking, or obesity. No scalp soft tissue abnormality. Sinuses/Orbits: Globes and orbital soft tissues within normal limits. Scattered mucosal thickening throughout the paranasal sinuses. Fluid within the nasopharynx. Scattered bilateral mastoid effusions. Patient likely intubated. IMPRESSION: 1. Persistent punctate diffusion abnormality at previously identified small left paramedian pontine infarct. 2. No other new acute intracranial process. 3. Small chronic right cerebellar infarcts, stable. Electronically Signed   By: Jeannine Boga M.D.   On: 09/28/2016 00:39   Dg Chest Port 1 View  Result Date: 09/29/2016 CLINICAL DATA:  Respiratory failure. EXAM: PORTABLE CHEST 1 VIEW COMPARISON:  09/26/2016 FINDINGS: Airspace disease or consolidation along the medial right lung base has slightly improved. However, there are persistent bilateral airspace densities, particularly in the perihilar regions. The disease in the perihilar regions is more confluent than it was on the previous examination. Cardiac silhouette is grossly stable. There is a tracheostomy tube. PICC line tip in the lower SVC region. IMPRESSION: Persistent bilateral airspace disease. Improved aeration at the medial right lung base with increased disease in the perihilar regions. Electronically Signed   By: Markus Daft M.D.   On: 09/29/2016 09:39   SIGNIFICANT EVENTS: 11/22 - surgical bx sacral bone/wound 12/04 - transfer to ICU with status epilepticus 12/07 - eeg remains c poor neurostatus 12/17 - No seizures overnight, versed drip at 30mg /hr, intermittent hypothermia, decreased PEG tube drainage 12/18 - cervical puncture >>  LINES: PICC LUE 12/15 >>  Trach (chronic).  STUDIES:  EEG 12/06 >> burst suppression MRI brain 12/09 >> acute infarct Lt paracentral mid pons 3 mm, chronic Rt cerebellar infarct, atrophy MRI Brain 12/22>>persistant punctate diffusion abnormality at previously noted small left paramedian pontine  infarct, small chronic right cerebellar infarcts, stable.  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 CSF negative for infection (cervical puncture) C6 spine injury with quadriplegia. - AEDs per neurology -keppra , depakote, topamax - Versed PRN  -   Acute on chronic respiratory failure. Tracheostomy status.  - Full vent support --> wean when MS allows. - Defer trach change for now. - F/u CXR PRN.   HCAP, recurrent UTI, osteomyelitis, sacral/heel wounds. Leucocytosis - Monitor off Abx - ID following - WOC for dressing recommendations - Abx course complete per ID service - Trend CBC/ fever curve  Non-anion gap Metabolic acidosis in setting of hyperchloremia  Hypomagenesemia  Hypokalemia/ hypophos - Replace electrolytes as indicated - D/C lasix - Continue free water 250 q6  Anemia of critical illness   f/u CBC after transfusion 12/11 12/12 6.2->8.1 - Transfuse per ICU protocol for hgb <7, 1 U PRBC today  Rt subclavian/brachial DVT 2nd to PICC line 11/27. Repeat US 12/15 w/  cont extensive disease of the right sided venous system Hx of PE s/p IVC filter. Chronic diastolic CHF. Hypotension 2nd to sedation. - Neo gtt to keep MAP > 55 - Midodrine - Restarted heparin drip for PE 12/20  Severe protein calorie malnutrition. DM gastroparesis. - tube feeds at goal.  DM type 1. Hypoglycemic episodes  12/22 - SSI-sensitive scale Increase lantus to 10   GOC - No family bedside 12/22.   cctime x 49m  Kara Mead MD. FCCP. [facility] Pager [phone] If no response call [phone]     09/29/2016

## 2016-09-29 NOTE — Progress Notes (Signed)
eLink Physician-Brief Progress Note Patient Name: Jason [name] DOB: December 12, 1985 MRN: [id number]   Date of Service  09/29/2016  HPI/Events of Note  Notified suprapubic catheter with > 100k kleb, sensitivities pending/ note ID has followed, multiple allergies listed/ no obvious clinical infection  eICU Interventions  Follow up ID pending sensitivities/ hold empirical rx for now      Intervention Category Major Interventions: Infection - evaluation and management  Christinia Gully 09/29/2016, 4:23 PM

## 2016-09-29 NOTE — Progress Notes (Signed)
CRITICAL VALUE ALERT  Critical value received:  PTT    Date of notification:  09/29/2016  Time of notification:  2000  Critical value read back:Yes.    Nurse who received alert:  Reinaldo Berber    MD notified (1st page):  [place] notified, orders received   Time of first page:  n/a  MD notified (2nd page):  Time of second page:  Responding MD:  n/a  Time MD responded:  n/a

## 2016-09-29 NOTE — Progress Notes (Signed)
[facility] Progress Note Patient Name: TRYG INDELICATO DOB: Jan 15, 1986 MRN: [id number]   Date of Service  09/29/2016  HPI/Events of Note  Hypophos and moderately low potassium  eICU Interventions  Phos and potassium replaced     Intervention Category Intermediate Interventions: Electrolyte abnormality - evaluation and management  Johara Lodwick 09/29/2016, 6:02 AM

## 2016-09-30 LAB — CBC
HCT: 27.3 % — ABNORMAL LOW (ref 39.0–52.0)
Hemoglobin: 8.7 g/dL — ABNORMAL LOW (ref 13.0–17.0)
MCH: 29.4 pg (ref 26.0–34.0)
MCHC: 31.9 g/dL (ref 30.0–36.0)
MCV: 92.2 fL (ref 78.0–100.0)
Platelets: 24 10*3/uL — CL (ref 150–400)
RBC: 2.96 MIL/uL — AB (ref 4.22–5.81)
RDW: 20.6 % — ABNORMAL HIGH (ref 11.5–15.5)
WBC: 25.2 10*3/uL — AB (ref 4.0–10.5)

## 2016-09-30 LAB — BASIC METABOLIC PANEL
ANION GAP: 8 (ref 5–15)
BUN: 36 mg/dL — AB (ref 6–20)
CALCIUM: 9.3 mg/dL (ref 8.9–10.3)
CO2: 12 mmol/L — ABNORMAL LOW (ref 22–32)
Chloride: 123 mmol/L — ABNORMAL HIGH (ref 101–111)
Creatinine, Ser: 0.68 mg/dL (ref 0.61–1.24)
GFR calc Af Amer: >60 mL/min (ref >60)
Glucose, Bld: 207 mg/dL — ABNORMAL HIGH (ref 65–99)
POTASSIUM: 5 mmol/L (ref 3.5–5.1)
SODIUM: 143 mmol/L (ref 135–145)

## 2016-09-30 LAB — GLUCOSE, CAPILLARY
GLUCOSE-CAPILLARY: 131 mg/dL — AB (ref 65–99)
GLUCOSE-CAPILLARY: 137 mg/dL — AB (ref 65–99)
GLUCOSE-CAPILLARY: 143 mg/dL — AB (ref 65–99)
GLUCOSE-CAPILLARY: 197 mg/dL — AB (ref 65–99)
Glucose-Capillary: 144 mg/dL — ABNORMAL HIGH (ref 65–99)
Glucose-Capillary: 137 mg/dL — ABNORMAL HIGH (ref 65–99)

## 2016-09-30 LAB — MAGNESIUM: MAGNESIUM: 1.8 mg/dL (ref 1.7–2.4)

## 2016-09-30 LAB — APTT
APTT: 172 s — AB (ref 24–36)
APTT: 143 s — AB (ref 24–36)
APTT: 120 s — AB (ref 24–36)
APTT: 120 s — AB (ref 24–36)
APTT: 126 s — AB (ref 24–36)
aPTT: 176 seconds (ref 24–36)

## 2016-09-30 LAB — TYPE AND SCREEN
Blood Product Expiration Date: 201801022359
ISSUE DATE / TIME: 201712231551
Unit Type and Rh: 5100

## 2016-09-30 LAB — PHOSPHORUS: Phosphorus: 3.9 mg/dL (ref 2.5–4.6)

## 2016-09-30 LAB — HEPARIN INDUCED PLATELET AB (HIT ANTIBODY): HEPARIN INDUCED PLT AB: 0.296 {OD_unit} (ref 0.000–0.400)

## 2016-09-30 MED ORDER — SODIUM CHLORIDE 0.9 % IV SOLN
0.0300 mg/kg/h | INTRAVENOUS | Status: DC
  Filled 2016-09-30: qty 250

## 2016-09-30 MED ORDER — DEXTROSE 5 % IV SOLN
1.0000 g | Freq: Three times a day (TID) | INTRAVENOUS | Status: DC
  Administered 2016-09-30 – 2016-10-02 (×6): 1 g via INTRAVENOUS
  Filled 2016-09-30 (×7): qty 1

## 2016-09-30 MED ORDER — DEXAMETHASONE SODIUM PHOSPHATE 4 MG/ML IJ SOLN
4.0000 mg | Freq: Three times a day (TID) | INTRAMUSCULAR | Status: DC
  Administered 2016-09-30 – 2016-10-02 (×6): 4 mg via INTRAVENOUS
  Filled 2016-09-30 (×6): qty 1

## 2016-09-30 MED ORDER — ALTEPLASE 2 MG IJ SOLR
2.0000 mg | Freq: Once | INTRAMUSCULAR | Status: AC
  Administered 2016-09-30: 2 mg

## 2016-09-30 MED ORDER — CEFTAZIDIME 1 G IJ SOLR
1.0000 g | Freq: Three times a day (TID) | INTRAMUSCULAR | Status: DC
  Filled 2016-09-30: qty 1

## 2016-09-30 NOTE — Progress Notes (Signed)
CRITICAL VALUE ALERT  Critical value received:  PTT 143  Date of notification:  12/24  Time of notification:  [id number]  Critical value read back:Yes.    Nurse who received alert:  Allegra Lai RN  Pharmacist called and new orders placed

## 2016-09-30 NOTE — Progress Notes (Signed)
CRITICAL VALUE ALERT  Critical value received:  PTT   Date of notification:  09/30/2016  Time of notification:  [id number]  Critical value read back:Yes.    Nurse who received alert:  Reinaldo Berber RN  MD notified (1st page):  N/a, pharmacy called, orders received   Time of first page:  n/a  MD notified (2nd page):  Time of second page:  Responding MD:  n/a  Time MD responded:  n/a

## 2016-09-30 NOTE — Progress Notes (Addendum)
CRITICAL VALUE ALERT  Critical value received:  PLT 24  Date of notification:  09/30/2016   Time of notification:  0622  Critical value read back:Yes.    Nurse who received alert:  Reinaldo Berber   MD notified (1st page):  elink called  Time of first page:  0645  MD notified (2nd page):  Time of second page:  Responding MD:  n/a  Time MD responded:  n/a

## 2016-09-30 NOTE — Progress Notes (Signed)
ANTICOAGULATION CONSULT NOTE - Initial Consult  Pharmacy Consult for Argatroban  Indication: pulmonary embolus and DVT  Patient Measurements: Height: 5\' 8"  (172.7 cm) Weight: 106 lb (48.1 kg) IBW/kg (Calculated) : 68.4  Vital Signs: Temp: 97.7 F (36.5 C) (12/24 2215) Temp Source: Oral (12/24 2215) BP: 88/58 (12/24 2300) Pulse Rate: 80 (12/24 2326)  Labs:  Recent Labs  09/28/16 0530 09/28/16 1413 09/28/16 2158 09/29/16 0500 09/29/16 0927  09/30/16 0254  09/30/16 1436 09/30/16 1626 09/30/16 2135  HGB 7.2*  --   --  7.0*  --   --  8.7*  --   --   --   --   HCT 22.4*  --   --  21.6*  --   --  27.3*  --   --   --   --   PLT 89*  --   --  50*  --   --  24*  --   --   --   --   APTT  --   --   --   --   --   < > 176*  < > 120* 120* 126*  HEPARINUNFRC  --  0.16* 0.24*  --  0.31  --   --   --   --   --   --   CREATININE 0.56*  --   --  0.47*  --   --  0.68  --   --   --   --   < > = values in this interval not displayed.  Estimated Creatinine Clearance: 90.2 mL/min (by C-G formula based on SCr of 0.68 mg/dL).  Assessment: 30 y/o M with hx PE and now with new onset DVT in RUE. Was on heparin but switched to bivalirudin in the setting of worsening platelets (89>>50>>24). Initial aPTT on bival was >200 and aPTTs have been high since. Bival has now been OFF for 12 hours and aPTT remains elevated at 126. Pt appears to not be adequately clearing bival. Discussed with CCM, once aPTT approaches ~80 off bival, will switch to argatroban and see if we have any better results.   Goal of Therapy:  aPTT 50-90 seconds Monitor platelets by anticoagulation protocol: Yes   Plan:  -Holding anti-coagulation for now -0500 aPTT -When aPTT approaches ~80, start low dose argatroban  Narda Bonds 09/30/2016,11:44 PM

## 2016-09-30 NOTE — Progress Notes (Signed)
Name: Jason [name] MRN: [id number] DOB: Oct 06, 1986    ADMISSION DATE:  08/11/2016 CONSULTATION DATE:  11/21  REFERRING MD :  Dr. Algis Liming ([facility])   CHIEF COMPLAINT:  Lethargy, vent mgmt   BRIEF PATIENT DESCRIPTION:   30 yo male developed altered mental status after receiving azactam in setting of chronic wound infections (sacrum/heels).  He has hx of C6 spine injury with quadriplegia, tracheostomy status.  SUBJECTIVE:  Remains critically ill  , off neo gtt UNresponsive poor UO Afebrile PEG tube leaking per RN   VITAL SIGNS: BP 110/71   Pulse 75   Temp 97.9 F (36.6 C) (Axillary)   Resp (!) 27   Ht 5\' 8"  (1.727 m)   Wt 106 lb (48.1 kg)   SpO2 100%   BMI 16.12 kg/m   INTAKE/OUTPUT: I/O last 3 completed shifts: In: 6557.9 [I.V.:1300.3; Blood:742; Other:430; [id number]; IV [id number] Out: [id number] [Urine:1650; [id number]  PHYSICAL EXAMINATION: General: Chronically ill appearing young male in NAD trached  on vent HEENT: Tracheostomy clean, no drainage  Cardiovascular:  s1 s2 Regular rhythm Pulmonary: resps even non labored on vent, few scattered rhonchi  Abdomen: Soft. Normal bowel sounds. Non distended, colostomy, Peg clamped, TF on hold  Neurological: no response to verbal stimuli, pupils 3 mm =/R, doll's eyes intact , mild extension to noxious stimuli EXT:  RUE>LUE edema   CMP Latest Ref Rng & Units 09/30/2016 09/29/2016 09/28/2016  Glucose 65 - 99 mg/dL 207(H) 216(H) 60(L)  BUN 6 - 20 mg/dL 36(H) 23(H) 22(H)  Creatinine 0.61 - 1.24 mg/dL 0.68 0.47(L) 0.56(L)  Sodium 135 - 145 mmol/L 143 148(H) 152(H)  Potassium 3.5 - 5.1 mmol/L 5.0 3.7 3.3(L)  Chloride 101 - 111 mmol/L 123(H) 127(H) 130(H)  CO2 22 - 32 mmol/L 12(L) 13(L) 16(L)  Calcium 8.9 - 10.3 mg/dL 9.3 9.7 9.9  Total Protein 6.5 - 8.1 g/dL - - -  Total Bilirubin 0.3 - 1.2 mg/dL - - -  Alkaline Phos 38 - 126 U/L - - -  AST 15 - 41 U/L - - -  ALT 17 - 63 U/L - - -    CBC Latest Ref Rng &  Units 09/30/2016 09/29/2016 09/28/2016  WBC 4.0 - 10.5 K/uL 25.2(H) 19.2(H) 19.2(H)  Hemoglobin 13.0 - 17.0 g/dL 8.7(L) 7.0(L) 7.2(L)  Hematocrit 39.0 - 52.0 % 27.3(L) 21.6(L) 22.4(L)  Platelets 150 - 400 K/uL 24(LL) 50(L) 89(L)    Imaging: Dg Chest Port 1 View  Result Date: 09/29/2016 CLINICAL DATA:  Respiratory failure. EXAM: PORTABLE CHEST 1 VIEW COMPARISON:  09/26/2016 FINDINGS: Airspace disease or consolidation along the medial right lung base has slightly improved. However, there are persistent bilateral airspace densities, particularly in the perihilar regions. The disease in the perihilar regions is more confluent than it was on the previous examination. Cardiac silhouette is grossly stable. There is a tracheostomy tube. PICC line tip in the lower SVC region. IMPRESSION: Persistent bilateral airspace disease. Improved aeration at the medial right lung base with increased disease in the perihilar regions. Electronically Signed   By: Markus Daft M.D.   On: 09/29/2016 09:39   SIGNIFICANT EVENTS: 11/22 - surgical bx sacral bone/wound 12/04 - transfer to ICU with status epilepticus 12/07 - eeg remains c poor neurostatus 12/17 - No seizures overnight, versed drip at 30mg /hr, intermittent hypothermia, decreased PEG tube drainage 12/18 - cervical puncture >>  LINES: PICC LUE 12/15 >>  Trach (chronic).  STUDIES:  EEG 12/06 >> burst suppression MRI brain 12/09 >>  acute infarct Lt paracentral mid pons 3 mm, chronic Rt cerebellar infarct, atrophy MRI Brain 12/22>>persistant punctate diffusion abnormality at previously noted small left paramedian pontine infarct, small chronic right cerebellar infarcts, stable.  MICROBIOLOGY:  Blood Ctx x3 11/19:  3/3 Streptococcus constellatus  Urine Ctx 11/19:  Acinetobacter & Enterococcus faecalis MRSA PCR 11/20:  Positive Sacral Bone Ctx 11/22:  ESBL Kleb pna, Enterococcus faecalis, strep constellatus Blood Ctx x2 11/23:  Negative  Tracheal Asp Ctx  11/23:  Klebsiella pneumoniae & Acinetobacter Tracheal Asp Ctx 11/24:  Klebsiella pneumoniae & Acinetobacter Tracheal Asp Ctx 11/30:  Pseudomonas aeruginosa & Stenotrophomonas  Urine Culture 12/22>> klebs  ASSESSMENT / PLAN:  Acute encephalopathy Status epilepticus >> possibly related to Abx vs meningitis  Pons infarct >> ?significance. CSF negative for infection (cervical puncture) C6 spine injury with quadriplegia. - AEDs per neurology -keppra , depakote, topamax - Versed PRN  - trying IVIgG again per Neuro   Acute on chronic respiratory failure. Tracheostomy status.  - Full vent support --> wean when MS allows. - Defer trach change for now. - F/u CXR PRN.   HCAP, recurrent UTI, sacral osteomyelitis, sacral/heel wounds. Pseudomonas in sputum is sens to ceftaz only & klebs in urine is ESBL org Leucocytosis - will defer to ID, star ceftaz - WOC for dressing recommendations - Abx course complete per ID service - Trend CBC/ fever curve  Non-anion gap Metabolic acidosis in setting of hyperchloremia  Hypomagenesemia  Hypokalemia/ hypophos - Replace electrolytes as indicated - D/C lasix - Continue free water 250 q6  Anemia of critical illness   f/u CBC after transfusion 12/11 12/12 6.2->8.1 - Transfuse per ICU protocol for hgb <7, 1 U PRBC today  Rt subclavian/brachial DVT 2nd to PICC line 11/27. 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 Chronic diastolic CHF. Hypotension 2nd to sedation. Thrombocytopenia on heparin ? HIT - Neo off - ct Midodrine - chk HIRT panel, changed heparin to angiomax 12/23  Severe protein calorie malnutrition. DM gastroparesis. - tube feeds held due to PEG malfunction, IR called 12/24  DM type 1. Hypoglycemic episodes 12/22 - SSI-sensitive scale Increased lantus to 10   GOC - No family bedside 12/22.   cctime x 26m  Kara Mead MD. FCCP. [facility] Pager [phone] If no response call [phone]     09/30/2016

## 2016-09-30 NOTE — Progress Notes (Signed)
ANTICOAGULATION CONSULT NOTE - Follow Up Consult  Pharmacy Consult for Bivalirudin Indication: pulmonary embolus and DVT  Patient Measurements: Height: 5\' 8"  (172.7 cm) Weight: 106 lb (48.1 kg) IBW/kg (Calculated) : 68.4  Vital Signs: Temp: 97.9 F (36.6 C) (12/24 0350) Temp Source: Oral (12/24 0350) BP: 115/81 (12/24 0400) Pulse Rate: 95 (12/24 0413)  Labs:  Recent Labs  09/28/16 0530 09/28/16 1413 09/28/16 2158 09/29/16 0500 09/29/16 0927 09/29/16 1501 09/29/16 2008 09/30/16 0254  HGB 7.2*  --   --  7.0*  --   --   --  8.7*  HCT 22.4*  --   --  21.6*  --   --   --  27.3*  PLT 89*  --   --  50*  --   --   --  PENDING  APTT  --   --   --   --   --  >200* 184* 176*  HEPARINUNFRC  --  0.16* 0.24*  --  0.31  --   --   --   CREATININE 0.56*  --   --  0.47*  --   --   --  0.68    Estimated Creatinine Clearance: 90.2 mL/min (by C-G formula based on SCr of 0.68 mg/dL).  Assessment: 29 YOM with hx of spinal cord injury, quadriplegia trach & peg dependant, admitted on 11/19 with sepsis. Found to have subclavian/brachial DVT 2nd to PICC line 11/27. Originally holding off anticoagulation d/t low hgb/pltc and PEG site bleeding. Pt has IVC filter. 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 Pharmacy is consul. Hgb is low but fairly stable.  12/23: Platelets down to 50, pt switched from heparin to bivalirudin  12/24: aPTT remains supra-therapeutic despite rate decreases  Goal of Therapy:  aPTT 50-90 seconds Monitor platelets by anticoagulation protocol: Yes   Plan:  -Hold Bivalirudin x 1 hr -Restart Bivalirudin at 0.03 mg/kg/hr at 0530 -Check 0800 aPTT  Narda Bonds 09/30/2016,5:07 AM

## 2016-09-30 NOTE — Progress Notes (Addendum)
Subjective: No changes  Exam: Vitals:   09/30/16 1300 09/30/16 1400  BP: 115/80 110/71  Pulse: 75 75  Resp: (!) 29 (!) 27  Temp:     Gen: In bed, tracheostomy Resp: ventilated.  Abd: soft, nt  Neuro:  MS: does not respond, does not open eyes.  [id number] reactive, eyes downwardly deviated, corneals present bilaterally.   Motor: no response to noxious stimuli.  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 Due to infection in lumbar area, Tap was delayed and cervical puncture was pursued without evidence of infection.   Suspicion therefore has been geared more towards a toxic/metabolic issue and the option that has been foremost in my thinking is medication related given that he was on antibiotics which lower seizure threshold(imipenem). At thsi point, however he has been off of all antibiotics for  About two weeks.   His mother reports multiple episodes of decreased mental status, obtundation, etc. with beta-lactam antibiotics in the past and I wonder if he is somewhat sensitive to this Neurotoxicity reaction.   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 I have started anti-platelet therapy, but am hodling due to thrombocytopenia.   He had recurrent seizures on 12/10 and therefore was re-suppressed. Versed was stopped 12/17. It may take quite some time for him to wake up given how long the induced coma lasted, but I was hoping to see more improvement by this point.     Cervical puncture(12/18) without evidence of infection. Elevated protein of unclear significance as this can be seen in many disorders, including status epilepticus. Repeat MRI 12/21 with no evidence of injury.  Repeat continuous EEG 12/19-12/20 without evidence of ongiong seizure activity.   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   Recommendations: 1) continue depakote 1gm Q6H 2) continue keppra 1500mg  BID 3) continue topamax 100mg   5) IVIG 2g/kg div over 5 days, today is day 2 6) Solumedrol 1gm daily x 5 days, today is day 2 7) holding ASA due to thrombocytopenia.  8) will follow.     Roland Rack, MD [facility] [phone]  If 7pm- 7am, please page neurology on call as listed in [facility]. 09/30/2016  2:11 PM

## 2016-09-30 NOTE — Progress Notes (Signed)
ANTICOAGULATION CONSULT NOTE - Follow Up Consult  Pharmacy Consult for Bivalirudin Indication: DVT, h/o PE  Allergies  Allergen Reactions  . Amikacin Sulfate Other (See Comments)    Seizure   . Cefuroxime Axetil Anaphylaxis    Tolerated cefepime 12/2015 - with Pepcid/Solu-Medrol  . Imipenem Other (See Comments)    SEIZURES, ? DOSE RELATED ?  Marland Kitchen Penicillins Anaphylaxis and Other (See Comments)    Tolerated Imipenem; no reaction to 7 day course of amoxicillin in 2015 Has patient had a PCN reaction causing immediate rash, facial/tongue/throat swelling, SOB or lightheadedness with hypotension: Yes Has patient had a PCN reaction causing severe rash involving mucus membranes or skin necrosis: No Has patient had a PCN reaction that required hospitalization Yes Has patient had a PCN reaction occurring within the last 10 years: No If all of the above answers are "NO", then may proceed with Cephalo  . Sulfa Antibiotics Anaphylaxis and Shortness Of Breath  . Tessalon [Benzonatate] Anaphylaxis  . Aztreonam Swelling    FACIAL AND NECK SWELLING WITHOUT PREMEDICATION.  . Ertapenem Rash and Other (See Comments)    CONFUSION 'IMIPENEM RESPONSIBLE FOR SEIZURES'  . Levaquin [Levofloxacin In D5w] Swelling    Hand and forearm swelling  . Meropenem Other (See Comments)    ALTERED MENTAL STATUS, CONFUSION 'IMIPENEM RESPONSIBLE FOR SEIZURES'  . Morphine And Related Other (See Comments)    ALTERED MENTAL STATUS CONFUSION, HEADACHES VISUAL HALLUCINATIONS  . Nsaids Itching and Other (See Comments)    Risk of bleeding, itching  . Shellfish Allergy Itching    Took benadryl to alleviate reaction  . Rocephin [Ceftriaxone] Other (See Comments)    Confusion    Patient Measurements: Height: 5\' 8"  (172.7 cm) Weight: 106 lb (48.1 kg) IBW/kg (Calculated) : 68.4 Heparin Dosing Weight: 44 kg  Vital Signs: Temp: 97 F (36.1 C) (12/24 0815) Temp Source: Axillary (12/24 0815) BP: 112/74 (12/24 1000) Pulse  Rate: 73 (12/24 1000)  Labs:  Recent Labs  09/28/16 0530 09/28/16 1413 09/28/16 2158 09/29/16 0500 09/29/16 0927  09/29/16 2008 09/30/16 0254 09/30/16 0926  HGB 7.2*  --   --  7.0*  --   --   --  8.7*  --   HCT 22.4*  --   --  21.6*  --   --   --  27.3*  --   PLT 89*  --   --  50*  --   --   --  24*  --   APTT  --   --   --   --   --   < > 184* 176* 172*  HEPARINUNFRC  --  0.16* 0.24*  --  0.31  --   --   --   --   CREATININE 0.56*  --   --  0.47*  --   --   --  0.68  --   < > = values in this interval not displayed.  Estimated Creatinine Clearance: 90.2 mL/min (by C-G formula based on SCr of 0.68 mg/dL).   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  NEW DVT in right UE 11/27 from PICC line and confirmed 12/5 doppler,stopped UFH 2nd low PLTC (37 on 11/27) and bleeding PEG site. 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 Noted multiple blood transfusions recently. Hgb down to 7>8.7 today after transfusion. Plts down again 24. Elsworth Soho ok to change to Bival 12/23.  - Foot stick for levels, DVT in R arm,  PICC in L arm/no levels. -12/24: aPTT 172 will not decline. Hold for 2 hrs. HIT ab negative   Goal of Therapy:  Heparin level 0.3-0.7 units/ml Monitor platelets by anticoagulation protocol: Yes   Plan:  Hold bival x 2 hours. Resume when aPTT <100 Hold ASA? Add bicarb?  Davari Lopes S. Alford Highland, PharmD, [place] Clinical Staff Pharmacist Pager [phone]  Eilene Ghazi Stillinger 09/30/2016,11:12 AM

## 2016-09-30 NOTE — Progress Notes (Signed)
eLink Physician-Brief Progress Note Patient Name: Jason [name] DOB: 24-Jan-1986 MRN: [id number]   Date of Service  09/30/2016  HPI/Events of Note   Recent Labs Lab 09/26/16 0450 09/27/16 0500 09/28/16 0530 09/29/16 0500 09/30/16 0254  CREATININE 0.48* 0.53* 0.56* 0.47* 0.68     PTT still > 100 12h afer bivalirudin turned off  eICU Interventions   pk for argatroban when PTT normalizes - d/w pharmacist who placed call   Recent Labs Lab 09/10/2016 0930  INR 1.43        Intervention Category Major Interventions: Other:  Dominque Marlin 09/30/2016, 11:36 PM

## 2016-09-30 NOTE — Progress Notes (Addendum)
CRITICAL VALUE ALERT  Critical value received: APTT 172  Date of notification:  12/24  Time of notification:  [id number]  Critical value read back:Yes.    Nurse who received alert:  Allegra Lai, RN  Responding MD:  Pharmacist called. New order to hold medication for 2 hours.  Time MD responded:  n/a

## 2016-09-30 NOTE — Progress Notes (Signed)
ANTICOAGULATION CONSULT NOTE - Follow Up Consult  Pharmacy Consult for Bivalirudin + Fortaz Indication: DVT, h/o PE, PNA  Allergies  Allergen Reactions  . Amikacin Sulfate Other (See Comments)    Seizure   . Cefuroxime Axetil Anaphylaxis    Tolerated cefepime 12/2015 - with Pepcid/Solu-Medrol  . Imipenem Other (See Comments)    SEIZURES, ? DOSE RELATED ?  Marland Kitchen Penicillins Anaphylaxis and Other (See Comments)    Tolerated Imipenem; no reaction to 7 day course of amoxicillin in 2015 Has patient had a PCN reaction causing immediate rash, facial/tongue/throat swelling, SOB or lightheadedness with hypotension: Yes Has patient had a PCN reaction causing severe rash involving mucus membranes or skin necrosis: No Has patient had a PCN reaction that required hospitalization Yes Has patient had a PCN reaction occurring within the last 10 years: No If all of the above answers are "NO", then may proceed with Cephalo  . Sulfa Antibiotics Anaphylaxis and Shortness Of Breath  . Tessalon [Benzonatate] Anaphylaxis  . Aztreonam Swelling    FACIAL AND NECK SWELLING WITHOUT PREMEDICATION.  . Ertapenem Rash and Other (See Comments)    CONFUSION 'IMIPENEM RESPONSIBLE FOR SEIZURES'  . Levaquin [Levofloxacin In D5w] Swelling    Hand and forearm swelling  . Meropenem Other (See Comments)    ALTERED MENTAL STATUS, CONFUSION 'IMIPENEM RESPONSIBLE FOR SEIZURES'  . Morphine And Related Other (See Comments)    ALTERED MENTAL STATUS CONFUSION, HEADACHES VISUAL HALLUCINATIONS  . Nsaids Itching and Other (See Comments)    Risk of bleeding, itching  . Shellfish Allergy Itching    Took benadryl to alleviate reaction  . Rocephin [Ceftriaxone] Other (See Comments)    Confusion    Patient Measurements: Height: 5\' 8"  (172.7 cm) Weight: 106 lb (48.1 kg) IBW/kg (Calculated) : 68.4 Heparin Dosing Weight: 44 kg  Vital Signs: Temp: 92.8 F (33.8 C) (12/24 1830) Temp Source: Rectal (12/24 1830) BP: 85/57 (12/24  1900) Pulse Rate: 72 (12/24 1900)  Labs:  Recent Labs  09/28/16 0530 09/28/16 1413 09/28/16 2158 09/29/16 0500 09/29/16 0927  09/30/16 0254  09/30/16 1235 09/30/16 1436 09/30/16 1626  HGB 7.2*  --   --  7.0*  --   --  8.7*  --   --   --   --   HCT 22.4*  --   --  21.6*  --   --  27.3*  --   --   --   --   PLT 89*  --   --  50*  --   --  24*  --   --   --   --   APTT  --   --   --   --   --   < > 176*  < > 143* 120* 120*  HEPARINUNFRC  --  0.16* 0.24*  --  0.31  --   --   --   --   --   --   CREATININE 0.56*  --   --  0.47*  --   --  0.68  --   --   --   --   < > = values in this interval not displayed.  Estimated Creatinine Clearance: 90.2 mL/min (by C-G formula based on SCr of 0.68 mg/dL).   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  NEW DVT in right UE 11/27 from PICC line and confirmed 12/5 doppler,stopped UFH 2nd low PLTC (37 on 11/27) and bleeding PEG site. Repeat US 12/15 w/ cont extensive  disease of the right sided venous system. Noted multiple blood transfusions recently. Hgb down to 7>8.7 today after transfusion. Plts down again 24. Jason [name] ok to change to Bival 12/23.  - Foot stick for levels, DVT in R arm, PICC in L arm/no levels. -12/24: aPTT 172, 143, 120, 120, HIT ab negative  ID: Tmax 103.2>92.8, WBC 25.2 up. Scr 0.68. Start abx with Decadron premed for HCAP   Goal of Therapy:  Heparin level 0.3-0.7 units/ml Monitor platelets by anticoagulation protocol: Yes   Plan:  Continue to hold bivalirudin. Check aPTT every 2 hrs. Resume when aPTT <100 Hold ASA?-done Add bicarb? Fortaz 1g IV q8hr.  Jason [name], PharmD, [place] Clinical Staff Pharmacist Pager [phone]  Jason [name] 09/30/2016,7:29 PM

## 2016-10-01 DIAGNOSIS — D696 Thrombocytopenia, unspecified: Secondary | ICD-10-CM

## 2016-10-01 LAB — GLUCOSE, CAPILLARY
GLUCOSE-CAPILLARY: 84 mg/dL (ref 65–99)
GLUCOSE-CAPILLARY: 51 mg/dL — AB (ref 65–99)
GLUCOSE-CAPILLARY: 60 mg/dL — AB (ref 65–99)
Glucose-Capillary: 87 mg/dL (ref 65–99)
Glucose-Capillary: 212 mg/dL — ABNORMAL HIGH (ref 65–99)
Glucose-Capillary: 138 mg/dL — ABNORMAL HIGH (ref 65–99)

## 2016-10-01 LAB — CBC
HCT: 17.8 % — ABNORMAL LOW (ref 39.0–52.0)
HCT: 26.4 % — ABNORMAL LOW (ref 39.0–52.0)
Hemoglobin: 5.8 g/dL — CL (ref 13.0–17.0)
Hemoglobin: 8.6 g/dL — ABNORMAL LOW (ref 13.0–17.0)
MCH: 30.1 pg (ref 26.0–34.0)
MCH: 30.1 pg (ref 26.0–34.0)
MCHC: 32.6 g/dL (ref 30.0–36.0)
MCHC: 32.6 g/dL (ref 30.0–36.0)
MCV: 92.2 fL (ref 78.0–100.0)
MCV: 92.3 fL (ref 78.0–100.0)
PLATELETS: 9 10*3/uL — AB (ref 150–400)
PLATELETS: 15 10*3/uL — AB (ref 150–400)
RBC: 1.93 MIL/uL — AB (ref 4.22–5.81)
RBC: 2.86 MIL/uL — ABNORMAL LOW (ref 4.22–5.81)
RDW: 20.9 % — ABNORMAL HIGH (ref 11.5–15.5)
RDW: 20.7 % — AB (ref 11.5–15.5)
WBC: 13.7 10*3/uL — ABNORMAL HIGH (ref 4.0–10.5)
WBC: 19.7 10*3/uL — ABNORMAL HIGH (ref 4.0–10.5)

## 2016-10-01 LAB — MAGNESIUM
MAGNESIUM: 1.7 mg/dL (ref 1.7–2.4)
Magnesium: 1.5 mg/dL — ABNORMAL LOW (ref 1.7–2.4)

## 2016-10-01 LAB — HEPATIC FUNCTION PANEL
ALK PHOS: 71 U/L (ref 38–126)
ALT: 8 U/L — AB (ref 17–63)
AST: 11 U/L — ABNORMAL LOW (ref 15–41)
Albumin: 1.5 g/dL — ABNORMAL LOW (ref 3.5–5.0)
BILIRUBIN TOTAL: 0.3 mg/dL (ref 0.3–1.2)
Total Protein: 6.1 g/dL — ABNORMAL LOW (ref 6.5–8.1)

## 2016-10-01 LAB — BASIC METABOLIC PANEL
ANION GAP: 9 (ref 5–15)
BUN: 49 mg/dL — ABNORMAL HIGH (ref 6–20)
CHLORIDE: 124 mmol/L — AB (ref 101–111)
CO2: 12 mmol/L — ABNORMAL LOW (ref 22–32)
CREATININE: 0.83 mg/dL (ref 0.61–1.24)
Calcium: 9.2 mg/dL (ref 8.9–10.3)
GFR calc non Af Amer: >60 mL/min (ref >60)
Glucose, Bld: 75 mg/dL (ref 65–99)
POTASSIUM: 3.5 mmol/L (ref 3.5–5.1)
SODIUM: 145 mmol/L (ref 135–145)

## 2016-10-01 LAB — TYPE AND SCREEN
ABO/RH(D): O POS
Antibody Screen: NEGATIVE

## 2016-10-01 LAB — PHOSPHORUS
PHOSPHORUS: 3.8 mg/dL (ref 2.5–4.6)
Phosphorus: 4.5 mg/dL (ref 2.5–4.6)

## 2016-10-01 LAB — URINE CULTURE
Culture: >=100000 — AB
SPECIAL REQUESTS: NORMAL

## 2016-10-01 LAB — APTT
aPTT: 124 seconds — ABNORMAL HIGH (ref 24–36)
aPTT: 126 seconds — ABNORMAL HIGH (ref 24–36)

## 2016-10-01 LAB — PROTIME-INR
INR: 3.1
PROTHROMBIN TIME: 32.7 s — AB (ref 11.4–15.2)

## 2016-10-01 MED ORDER — SODIUM CHLORIDE 0.9 % IV BOLUS (SEPSIS)
1000.0000 mL | Freq: Once | INTRAVENOUS | Status: AC
  Administered 2016-10-01: 1000 mL via INTRAVENOUS

## 2016-10-01 MED ORDER — SODIUM CHLORIDE 0.9 % IV SOLN
15.0000 mg/h | INTRAVENOUS | Status: DC
  Administered 2016-10-01 – 2016-10-04 (×12): 15 mg/h via INTRAVENOUS
  Filled 2016-10-01 (×14): qty 20

## 2016-10-01 MED ORDER — PRO-STAT SUGAR FREE PO LIQD
30.0000 mL | Freq: Two times a day (BID) | ORAL | Status: DC
  Administered 2016-10-01 – 2016-10-02 (×3): 30 mL
  Filled 2016-10-01 (×3): qty 30

## 2016-10-01 MED ORDER — VITAL HIGH PROTEIN PO LIQD
1000.0000 mL | ORAL | Status: DC
  Administered 2016-10-01: 23:00:00
  Administered 2016-10-01: 1000 mL
  Administered 2016-10-02 (×3)
  Administered 2016-10-02: 1000 mL

## 2016-10-01 MED ORDER — DEXTROSE-NACL 5-0.45 % IV SOLN
INTRAVENOUS | Status: DC
  Administered 2016-10-01: 50 mL via INTRAVENOUS
  Administered 2016-10-02: 07:00:00 via INTRAVENOUS

## 2016-10-01 MED ORDER — DEXTROSE 50 % IV SOLN
INTRAVENOUS | Status: AC
  Administered 2016-10-01: 25 mL
  Filled 2016-10-01: qty 50

## 2016-10-01 MED ORDER — MIDAZOLAM BOLUS VIA INFUSION
15.0000 mg | Freq: Once | INTRAVENOUS | Status: AC
  Administered 2016-10-01: 15 mg via INTRAVENOUS
  Filled 2016-10-01: qty 15

## 2016-10-01 NOTE — Progress Notes (Signed)
Name: Jason [name] MRN: [id number] DOB: [birth year]    ADMISSION DATE:  08/18/2016 CONSULTATION DATE:  11/21  REFERRING MD :  Dr. Algis Liming ([facility])   CHIEF COMPLAINT:  Lethargy, vent mgmt   BRIEF PATIENT DESCRIPTION:  30 y/o male developed altered mental status after receiving azactam in setting of chronic wound infections (sacrum/heels).  He has hx of C6 spine injury with quadriplegia, tracheostomy status.  SUBJECTIVE:   RN reports pt experiencing ongoing seizures, loaded with versed 15mg  then gtt.  PEG site leaking (ongoing) and IR came to advance > flushed post advancement with 60 ml fluid, no leakage. Hypoglycemia, required dextrose.   Platelets improved to 15.      VITAL SIGNS: BP (!) 101/57   Pulse 69   Temp 97.5 F (36.4 C) (Oral)   Resp (!) 24   Ht 5\' 8"  (1.727 m)   Wt 108 lb (49 kg)   SpO2 100%   BMI 16.42 kg/m   INTAKE/OUTPUT: I/O last 3 completed shifts: In: 2716.2 [I.V.:601.7; Other:60; [id number]; IV Piggyback:897] Out: 1910 [Urine:1235; Stool:675]  PHYSICAL EXAMINATION: General: Chronically ill appearing young male in NAD trached on vent HEENT: Tracheostomy clean, no drainage  Cardiovascular:  s1s2 regular rhythm Pulmonary: resps even non labored on vent, few scattered rhonchi  Abdomen: Soft. Normal bowel sounds. Non distended, colostomy, PEG clamped  Neurological: no response to verbal stimuli, pupils 3 mm =/R, twitching / seizure activity noted in face EXT:  RUE>LUE edema   CMP Latest Ref Rng & Units 10/01/2016 09/30/2016 09/29/2016  Glucose 65 - 99 mg/dL 75 207(H) 216(H)  BUN 6 - 20 mg/dL 49(H) 36(H) 23(H)  Creatinine 0.61 - 1.24 mg/dL 0.83 0.68 0.47(L)  Sodium 135 - 145 mmol/L 145 143 148(H)  Potassium 3.5 - 5.1 mmol/L 3.5 5.0 3.7  Chloride 101 - 111 mmol/L 124(H) 123(H) 127(H)  CO2 22 - 32 mmol/L 12(L) 12(L) 13(L)  Calcium 8.9 - 10.3 mg/dL 9.2 9.3 9.7  Total Protein 6.5 - 8.1 g/dL 6.1(L) - -  Total Bilirubin 0.3 - 1.2 mg/dL 0.3 - -    Alkaline Phos 38 - 126 U/L 71 - -  AST 15 - 41 U/L 11(L) - -  ALT 17 - 63 U/L 8(L) - -    CBC Latest Ref Rng & Units 10/01/2016 10/01/2016 09/30/2016  WBC 4.0 - 10.5 K/uL 19.7(H) 13.7(H) 25.2(H)  Hemoglobin 13.0 - 17.0 g/dL 8.6(L) 5.8(LL) 8.7(L)  Hematocrit 39.0 - 52.0 % 26.4(L) 17.8(L) 27.3(L)  Platelets 150 - 400 K/uL 15(LL) 9(LL) 24(LL)    Imaging: No results found.    SIGNIFICANT EVENTS: 11/22 - surgical bx sacral bone/wound 12/04 - transfer to ICU with status epilepticus 12/07 - eeg remains c poor neurostatus 12/17 - No seizures overnight, versed drip at 30mg /hr, intermittent hypothermia, decreased PEG tube drainage.  Versed d/c'd 12/18 - cervical puncture >> 12/25 - seizures, loaded with versed, IVIG per neuro  LINES: PICC LUE 12/15 >>  Trach (chronic).  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  MICROBIOLOGY:  Blood Ctx x3 11/19:  3/3 Streptococcus constellatus  Urine Ctx 11/19:  Acinetobacter & Enterococcus faecalis MRSA PCR 11/20:  Positive Sacral Bone Ctx 11/22:  ESBL Kleb pna, Enterococcus faecalis, strep constellatus Blood Ctx x2 11/23:  Negative  Tracheal Asp Ctx 11/23:  Klebsiella pneumoniae & Acinetobacter Tracheal Asp Ctx 11/24:  Klebsiella pneumoniae & Acinetobacter Tracheal Asp Ctx 11/30:  Pseudomonas aeruginosa & Stenotrophomonas  Urine Culture 12/22 >> ESBL klebsiella >> S- imipenem  ANTIBIOTICS:  Tressie Ellis 12/24 >>   ASSESSMENT / PLAN:  Acute encephalopathy Status epilepticus >> possibly related to Abx vs meningitis  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 CSF negative for infection (cervical puncture) C6 spine injury with quadriplegia. - AEDs per neurology > keppra, depakote, topamax -  Versed load 12/25 for recurrent seizures, then gtt - trying IVIgG again per Neuro, planned 7 days.  D3/7.   - serial neuro exams   Acute on chronic respiratory failure. Tracheostomy status. - PRVC 8cc/kg - SBT when mental status permits - Defer trach change for now. - F/u CXR PRN. - concerned for poor prognosis  HCAP, recurrent UTI, sacral osteomyelitis, sacral/heel wounds. Pseudomonas in sputum is sens to ceftaz only & klebs in urine is ESBL org Leucocytosis ESBL Klebsiella UTI - defer abx to ID - WOC for dressing recommendations - Trend CBC/ fever curve   Non-anion gap Metabolic acidosis in setting of hyperchloremia  Hypomagenesemia  Hypokalemia/ hypophos - Replace electrolytes as indicated - Monitor BMP / UOP  - Continue free water 250 q6   Anemia of critical illness Thrombocytopenia - changed to argatroban 12/23 with thrombocytopenia  - Trend CBC  - Transfuse per ICU protocol for hgb <7  - continue argatroban   Rt subclavian/brachial DVT 2nd to PICC line 11/27. 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 Chronic diastolic CHF. Hypotension 2nd to sedation. Thrombocytopenia on heparin ? HIT - ct Midodrine - chk HIT panel, changed heparin to argatroban per pharmacy 12/23 > not started yet due to aPTT elevation - monitor for bleeding    Severe protein calorie malnutrition. DM gastroparesis. - tube feeds held due to PEG malfunction, IR called 12/24 (advanced, 12/25) - begin trickle feeding and monitor for PEG leak    DM type 1. Hypoglycemic episodes 12/22, 12/25 - SSI-sensitive scale - D/C lantus with hypoglycemia events   FAMILY: none at bedside 12/25.    CC Time: 94 minutes  Noe Gens, NP-C [facility] Pgr: [phone] or if no answer [phone] 10/01/2016, 11:43 AM

## 2016-10-01 NOTE — Progress Notes (Signed)
Called by nursing staff for recurrence of seizure activity manifesting as facial twitching.  Versed 15 mg IV is being loaded and will restart Versed gtt at 15 mg/hr.   Electronically signed: Dr. Kerney Elbe

## 2016-10-01 NOTE — Progress Notes (Signed)
Patient began to have mouth twitching which then progressed to full facial twitching and then shoulders.  Neurology notified and orders received for versed bolus with drip starting at 15mg  per hour.  Will continue to monitor.

## 2016-10-01 NOTE — Progress Notes (Signed)
Patient noted to have decreased urine output (50 mL total over 4 hours via suprapubic catheter.) Catheter flushed with 47ml NS without increased output; tube feedings/free water was stopped earlier in the day due to malfunctioning Gtube.  Patient does not have any maintenance fluids running.  Elink notified and informed of above information.  Order given for fluid bolus.  Will continue to monitor.  Clyda Hurdle RN

## 2016-10-01 NOTE — Progress Notes (Signed)
[place] Progress Note Patient Name: Jason [name] DOB: 1986-03-20 MRN: [id number]   Date of Service  10/01/2016  HPI/Events of Note  MICRO ROUNDS: +urine culture with ESBL Klebsiella species, patient allergic to PCN, Imipenem   eICU Interventions  Recommend ID consult     Intervention Category Evaluation Type: Other  Flora Lipps 10/01/2016, 7:31 PM

## 2016-10-01 NOTE — Progress Notes (Signed)
Subjective: No overnight events.   Objective: Current vital signs: BP (!) 101/57   Pulse 69   Temp 97.5 F (36.4 C) (Oral)   Resp (!) 24   Ht 5' 8"  (1.727 m)   Wt 49 kg (108 lb)   SpO2 98%   BMI 16.42 kg/m  Vital signs in last 24 hours: Temp:  [91.8 F (33.2 C)-98.1 F (36.7 C)] 97.5 F (36.4 C) (12/25 0730) Pulse Rate:  [63-86] 69 (12/25 0800) Resp:  [19-32] 24 (12/25 0800) BP: (72-115)/(53-81) 101/57 (12/25 0800) SpO2:  [97 %-100 %] 98 % (12/25 0839) FiO2 (%):  [30 %] 30 % (12/25 0839) Weight:  [49 kg (108 lb)] 49 kg (108 lb) (12/25 0500)  Intake/Output from previous day: 12/24 0701 - 12/25 0700 In: 1039.1 [I.V.:196.1; IV Piggyback:843] Out: 0981 [Urine:1010; Stool:225] Intake/Output this shift: No intake/output data recorded. Nutritional status: Diet NPO time specified Except for: Sips with Meds  Neurologic Exam: Ment: Comatose. No volitional responses to any stimuli.  CN: Roving EOM. Pupils sluggish and minimally reactive. No doll's eye reflex present. Eyes are tonincally deviated downwards. Face flaccidly symmetric.  Motor/Sensory: Quadriplegic with no movement to stimuli and flaccid tone x 4.   Lab Results: Results for orders placed or performed during the hospital encounter of 08/25/2016 (from the past 48 hour(s))  Heparin induced platelet Ab (HIT antibody)     Status: None   Collection Time: 09/29/16 11:58 AM  Result Value Ref Range   Heparin Induced Plt Ab 0.296 0.000 - 0.400 OD    Comment: (NOTE) Performed At: [facility] [address] [place], [place] [id number] Lindon Romp MD AO:[id number]   Glucose, capillary     Status: Abnormal   Collection Time: 09/29/16 12:19 PM  Result Value Ref Range   Glucose-Capillary 205 (H) 65 - 99 mg/dL   Comment 1 Capillary Specimen    Comment 2 Notify RN    Comment 3 Document in Chart   Type and screen [place]     Status: None   Collection Time: 09/29/16  2:45 PM  Result Value Ref  Range   ISSUE DATE / TIME [id number]    Blood Product Unit Number [id number]    PRODUCT CODE [id number]    Unit Type and Rh 5100    Blood Product Expiration Date [id number]   APTT     Status: Abnormal   Collection Time: 09/29/16  3:01 PM  Result Value Ref Range   aPTT >200 (HH) 24 - 36 seconds    Comment:        IF BASELINE aPTT IS ELEVATED, SUGGEST PATIENT RISK ASSESSMENT BE USED TO DETERMINE APPROPRIATE ANTICOAGULANT THERAPY. REPEATED TO VERIFY CRITICAL RESULT CALLED TO, READ BACK BY AND VERIFIED WITH: CRYTAL ROBERTSON,PHARMACIST AT 1701 09/29/16 BY ZBEECH.   Prepare RBC     Status: None   Collection Time: 09/29/16  3:21 PM  Result Value Ref Range   Order Confirmation ORDER PROCESSED BY BLOOD BANK   Glucose, capillary     Status: Abnormal   Collection Time: 09/29/16  4:19 PM  Result Value Ref Range   Glucose-Capillary 199 (H) 65 - 99 mg/dL   Comment 1 Capillary Specimen    Comment 2 Notify RN    Comment 3 Document in Chart   Glucose, capillary     Status: Abnormal   Collection Time: 09/29/16  7:58 PM  Result Value Ref Range   Glucose-Capillary 153 (H) 65 - 99 mg/dL   Comment  1 Capillary Specimen    Comment 2 Notify RN   APTT     Status: Abnormal   Collection Time: 09/29/16  8:08 PM  Result Value Ref Range   aPTT 184 (HH) 24 - 36 seconds    Comment:        IF BASELINE aPTT IS ELEVATED, SUGGEST PATIENT RISK ASSESSMENT BE USED TO DETERMINE APPROPRIATE ANTICOAGULANT THERAPY. SPECIMEN CHECKED FOR CLOTS REPEATED TO VERIFY CRITICAL RESULT CALLED TO, READ BACK BY AND VERIFIED WITH: Westley Chandler RN AT 2202 ON 12.23.2017 BY COCHRANE S   Glucose, capillary     Status: Abnormal   Collection Time: 09/29/16 11:25 PM  Result Value Ref Range   Glucose-Capillary 170 (H) 65 - 99 mg/dL   Comment 1 Capillary Specimen    Comment 2 Notify RN   CBC     Status: Abnormal   Collection Time: 09/30/16  2:54 AM  Result Value Ref Range   WBC 25.2 (H) 4.0 - 10.5 K/uL   RBC 2.96  (L) 4.22 - 5.81 MIL/uL   Hemoglobin 8.7 (L) 13.0 - 17.0 g/dL   HCT 27.3 (L) 39.0 - 52.0 %   MCV 92.2 78.0 - 100.0 fL   MCH 29.4 26.0 - 34.0 pg   MCHC 31.9 30.0 - 36.0 g/dL   RDW 20.6 (H) 11.5 - 15.5 %   Platelets 24 (LL) 150 - 400 K/uL    Comment: SPECIMEN CHECKED FOR CLOTS REPEATED TO VERIFY PLATELET COUNT CONFIRMED BY SMEAR CRITICAL RESULT CALLED TO, READ BACK BY AND VERIFIED WITH: SARINE R RN AT [phone] ON 12.24.2017 BY COCHRANE S    Basic metabolic panel     Status: Abnormal   Collection Time: 09/30/16  2:54 AM  Result Value Ref Range   Sodium 143 135 - 145 mmol/L   Potassium 5.0 3.5 - 5.1 mmol/L    Comment: DELTA CHECK NOTED NO VISIBLE HEMOLYSIS    Chloride 123 (H) 101 - 111 mmol/L   CO2 12 (L) 22 - 32 mmol/L   Glucose, Bld 207 (H) 65 - 99 mg/dL   BUN 36 (H) 6 - 20 mg/dL   Creatinine, Ser 0.68 0.61 - 1.24 mg/dL   Calcium 9.3 8.9 - 10.3 mg/dL   GFR calc non Af Amer >60 >60 mL/min   GFR calc Af Amer >60 >60 mL/min    Comment: (NOTE) The eGFR has been calculated using the CKD EPI equation. This calculation has not been validated in all clinical situations. eGFR's persistently <60 mL/min signify possible Chronic Kidney Disease.    Anion gap 8 5 - 15  Magnesium     Status: None   Collection Time: 09/30/16  2:54 AM  Result Value Ref Range   Magnesium 1.8 1.7 - 2.4 mg/dL  Phosphorus     Status: None   Collection Time: 09/30/16  2:54 AM  Result Value Ref Range   Phosphorus 3.9 2.5 - 4.6 mg/dL  APTT     Status: Abnormal   Collection Time: 09/30/16  2:54 AM  Result Value Ref Range   aPTT 176 (HH) 24 - 36 seconds    Comment:        IF BASELINE aPTT IS ELEVATED, SUGGEST PATIENT RISK ASSESSMENT BE USED TO DETERMINE APPROPRIATE ANTICOAGULANT THERAPY. REPEATED TO VERIFY CRITICAL RESULT CALLED TO, READ BACK BY AND VERIFIED WITH: SARINE R RN AT [phone] ON 12.24.2017 BY COCHRANE S   Glucose, capillary     Status: Abnormal   Collection Time: 09/30/16  3:40 AM  Result Value Ref  Range   Glucose-Capillary 197 (H) 65 - 99 mg/dL   Comment 1 Capillary Specimen    Comment 2 Notify RN   Glucose, capillary     Status: Abnormal   Collection Time: 09/30/16  8:10 AM  Result Value Ref Range   Glucose-Capillary 144 (H) 65 - 99 mg/dL   Comment 1 Capillary Specimen    Comment 2 Notify RN   APTT     Status: Abnormal   Collection Time: 09/30/16  9:26 AM  Result Value Ref Range   aPTT 172 (HH) 24 - 36 seconds    Comment:        IF BASELINE aPTT IS ELEVATED, SUGGEST PATIENT RISK ASSESSMENT BE USED TO DETERMINE APPROPRIATE ANTICOAGULANT THERAPY. REPEATED TO VERIFY CRITICAL RESULT CALLED TO, READ BACK BY AND VERIFIED WITH: CELENIA SOSA RN AT 1043 09/30/16 BY WOOLLENK   APTT     Status: Abnormal   Collection Time: 09/30/16 12:35 PM  Result Value Ref Range   aPTT 143 (H) 24 - 36 seconds    Comment:        IF BASELINE aPTT IS ELEVATED, SUGGEST PATIENT RISK ASSESSMENT BE USED TO DETERMINE APPROPRIATE ANTICOAGULANT THERAPY. REPEATED TO VERIFY CRITICAL RESULT CALLED TO, READ BACK BY AND VERIFIED WITH: CELENIA SOSA RN AT 1339 09/30/16 BY WOOLLENK   APTT     Status: Abnormal   Collection Time: 09/30/16  2:36 PM  Result Value Ref Range   aPTT 120 (H) 24 - 36 seconds    Comment:        IF BASELINE aPTT IS ELEVATED, SUGGEST PATIENT RISK ASSESSMENT BE USED TO DETERMINE APPROPRIATE ANTICOAGULANT THERAPY.   Glucose, capillary     Status: Abnormal   Collection Time: 09/30/16  3:45 PM  Result Value Ref Range   Glucose-Capillary 143 (H) 65 - 99 mg/dL  APTT     Status: Abnormal   Collection Time: 09/30/16  4:26 PM  Result Value Ref Range   aPTT 120 (H) 24 - 36 seconds    Comment:        IF BASELINE aPTT IS ELEVATED, SUGGEST PATIENT RISK ASSESSMENT BE USED TO DETERMINE APPROPRIATE ANTICOAGULANT THERAPY.   Glucose, capillary     Status: Abnormal   Collection Time: 09/30/16  7:31 PM  Result Value Ref Range   Glucose-Capillary 131 (H) 65 - 99 mg/dL   Comment 1  Capillary Specimen    Comment 2 Notify RN   Glucose, capillary     Status: Abnormal   Collection Time: 09/30/16  7:55 PM  Result Value Ref Range   Glucose-Capillary 137 (H) 65 - 99 mg/dL  APTT     Status: Abnormal   Collection Time: 09/30/16  9:35 PM  Result Value Ref Range   aPTT 126 (H) 24 - 36 seconds    Comment:        IF BASELINE aPTT IS ELEVATED, SUGGEST PATIENT RISK ASSESSMENT BE USED TO DETERMINE APPROPRIATE ANTICOAGULANT THERAPY.   Glucose, capillary     Status: Abnormal   Collection Time: 09/30/16 11:37 PM  Result Value Ref Range   Glucose-Capillary 137 (H) 65 - 99 mg/dL   Comment 1 Capillary Specimen   Glucose, capillary     Status: None   Collection Time: 10/01/16  3:42 AM  Result Value Ref Range   Glucose-Capillary 84 65 - 99 mg/dL   Comment 1 Capillary Specimen    Comment 2 Notify RN    Comment 3 Document in Chart  APTT     Status: Abnormal   Collection Time: 10/01/16  4:36 AM  Result Value Ref Range   aPTT 124 (H) 24 - 36 seconds    Comment:        IF BASELINE aPTT IS ELEVATED, SUGGEST PATIENT RISK ASSESSMENT BE USED TO DETERMINE APPROPRIATE ANTICOAGULANT THERAPY.   CBC     Status: Abnormal   Collection Time: 10/01/16  4:36 AM  Result Value Ref Range   WBC 13.7 (H) 4.0 - 10.5 K/uL   RBC 1.93 (L) 4.22 - 5.81 MIL/uL   Hemoglobin 5.8 (LL) 13.0 - 17.0 g/dL    Comment: REPEATED TO VERIFY CRITICAL RESULT CALLED TO, READ BACK BY AND VERIFIED WITH: S.LEE,RN 0503 10/01/16 M.CAMPBELL    HCT 17.8 (L) 39.0 - 52.0 %   MCV 92.2 78.0 - 100.0 fL   MCH 30.1 26.0 - 34.0 pg   MCHC 32.6 30.0 - 36.0 g/dL   RDW 20.9 (H) 11.5 - 15.5 %   Platelets 9 (LL) 150 - 400 K/uL    Comment: SPECIMEN CHECKED FOR CLOTS REPEATED TO VERIFY CRITICAL VALUE NOTED.  VALUE IS CONSISTENT WITH PREVIOUSLY REPORTED AND CALLED VALUE.   Type and screen [place]     Status: None   Collection Time: 10/01/16  5:11 AM  Result Value Ref Range   ABO/RH(D) O POS    Antibody  Screen NEG    Sample Expiration 10/04/2016   Hepatic function panel     Status: Abnormal   Collection Time: 10/01/16  5:30 AM  Result Value Ref Range   Total Protein 6.1 (L) 6.5 - 8.1 g/dL   Albumin 1.5 (L) 3.5 - 5.0 g/dL   AST 11 (L) 15 - 41 U/L   ALT 8 (L) 17 - 63 U/L   Alkaline Phosphatase 71 38 - 126 U/L   Total Bilirubin 0.3 0.3 - 1.2 mg/dL   Bilirubin, Direct <0.1 (L) 0.1 - 0.5 mg/dL   Indirect Bilirubin NOT CALCULATED 0.3 - 0.9 mg/dL  CBC     Status: Abnormal   Collection Time: 10/01/16  5:30 AM  Result Value Ref Range   WBC 19.7 (H) 4.0 - 10.5 K/uL   RBC 2.86 (L) 4.22 - 5.81 MIL/uL   Hemoglobin 8.6 (L) 13.0 - 17.0 g/dL    Comment: DELTA CHECK NOTED RESULTS VERIFIED VIA RECOLLECT    HCT 26.4 (L) 39.0 - 52.0 %   MCV 92.3 78.0 - 100.0 fL   MCH 30.1 26.0 - 34.0 pg   MCHC 32.6 30.0 - 36.0 g/dL   RDW 20.7 (H) 11.5 - 15.5 %   Platelets 15 (LL) 150 - 400 K/uL    Comment: RESULTS VERIFIED VIA RECOLLECT PLATELET COUNT CONFIRMED BY SMEAR REPEATED TO VERIFY CRITICAL VALUE NOTED.  VALUE IS CONSISTENT WITH PREVIOUSLY REPORTED AND CALLED VALUE.   Protime-INR     Status: Abnormal   Collection Time: 10/01/16  5:30 AM  Result Value Ref Range   Prothrombin Time 32.7 (H) 11.4 - 15.2 seconds   INR 3.10   APTT     Status: Abnormal   Collection Time: 10/01/16  5:30 AM  Result Value Ref Range   aPTT 126 (H) 24 - 36 seconds    Comment:        IF BASELINE aPTT IS ELEVATED, SUGGEST PATIENT RISK ASSESSMENT BE USED TO DETERMINE APPROPRIATE ANTICOAGULANT THERAPY.   Basic metabolic panel     Status: Abnormal   Collection Time: 10/01/16  5:30 AM  Result Value Ref Range   Sodium 145 135 - 145 mmol/L   Potassium 3.5 3.5 - 5.1 mmol/L    Comment: DELTA CHECK NOTED   Chloride 124 (H) 101 - 111 mmol/L   CO2 12 (L) 22 - 32 mmol/L   Glucose, Bld 75 65 - 99 mg/dL   BUN 49 (H) 6 - 20 mg/dL   Creatinine, Ser 0.83 0.61 - 1.24 mg/dL   Calcium 9.2 8.9 - 10.3 mg/dL   GFR calc non Af Amer >60 >60  mL/min   GFR calc Af Amer >60 >60 mL/min    Comment: (NOTE) The eGFR has been calculated using the CKD EPI equation. This calculation has not been validated in all clinical situations. eGFR's persistently <60 mL/min signify possible Chronic Kidney Disease.    Anion gap 9 5 - 15  Magnesium     Status: None   Collection Time: 10/01/16  5:30 AM  Result Value Ref Range   Magnesium 1.7 1.7 - 2.4 mg/dL  Phosphorus     Status: None   Collection Time: 10/01/16  5:30 AM  Result Value Ref Range   Phosphorus 4.5 2.5 - 4.6 mg/dL  Glucose, capillary     Status: Abnormal   Collection Time: 10/01/16  7:34 AM  Result Value Ref Range   Glucose-Capillary 51 (L) 65 - 99 mg/dL   *Note: Due to a large number of results and/or encounters for the requested time period, some results have not been displayed. A complete set of results can be found in Results Review.    Recent Results (from the past 240 hour(s))  Fungus Culture With Stain     Status: None (Preliminary result)   Collection Time: 10/04/2016 12:45 PM  Result Value Ref Range Status   Fungus Stain Final report  Final    Comment: (NOTE) Performed At: [facility] [place], [place] [id number] Lindon Romp MD OZ:[id number]    Fungus (Mycology) Culture PENDING  Incomplete   Fungal Source CSF  Final  CSF culture with Stat gram stain     Status: None   Collection Time: 09/11/2016 12:45 PM  Result Value Ref Range Status   Specimen Description CSF  Final   Special Requests NONE  Final   Gram Stain   Final    WBC PRESENT,BOTH PMN AND MONONUCLEAR NO ORGANISMS SEEN CYTOSPIN SMEAR    Culture NO GROWTH 3 DAYS  Final   Report Status 09/28/2016 FINAL  Final  Fungus Culture Result     Status: None   Collection Time: 10/01/2016 12:45 PM  Result Value Ref Range Status   Result 1 Comment  Final    Comment: (NOTE) KOH/Calcofluor preparation:  no fungus observed. Performed At: [facility] [place],  [place] [id number] Lindon Romp MD KG:[id number]   Culture, Urine     Status: Abnormal   Collection Time: 09/28/16 10:10 AM  Result Value Ref Range Status   Specimen Description URINE, SUPRAPUBIC  Final   Special Requests Normal  Final   Culture (A)  Final    >=100,000 COLONIES/mL KLEBSIELLA PNEUMONIAE Confirmed Extended Spectrum Beta-Lactamase Producer (ESBL) RESULT CALLED TO, READ BACK BY AND VERIFIED WITH: Agnes Lawrence, NURSE AT [phone] ON [id number] BY Rhea Bleacher RESULT REPEATED AND VERIFIED SINCE SENSITIVITIES WERE MULTI DRUG RESISTANT    Report Status 10/01/2016 FINAL  Final   Organism ID, Bacteria KLEBSIELLA PNEUMONIAE (A)  Final      Susceptibility   Klebsiella pneumoniae - MIC*  AMPICILLIN >=32 RESISTANT Resistant     CEFAZOLIN >=64 RESISTANT Resistant     CEFTRIAXONE >=64 RESISTANT Resistant     CIPROFLOXACIN >=4 RESISTANT Resistant     GENTAMICIN >=16 RESISTANT Resistant     IMIPENEM <=0.25 SENSITIVE Sensitive     NITROFURANTOIN 64 INTERMEDIATE Intermediate     TRIMETH/SULFA >=320 RESISTANT Resistant     AMPICILLIN/SULBACTAM >=32 RESISTANT Resistant     PIP/TAZO >=128 RESISTANT Resistant     Extended ESBL POSITIVE Resistant     * >=100,000 COLONIES/mL KLEBSIELLA PNEUMONIAE    Lipid Panel No results for input(s): CHOL, TRIG, HDL, CHOLHDL, VLDL, LDLCALC in the last 72 hours.  Studies/Results: No results found.  Medications:  Scheduled: . albuterol  2.5 mg Nebulization QID  . artificial tears   Both Eyes Q8H  . baclofen  10 mg Oral QID  . budesonide (PULMICORT) nebulizer solution  0.5 mg Nebulization BID  . cefTAZidime (FORTAZ)  IV  1 g Intravenous Q8H   And  . dexamethasone  4 mg Intravenous Q8H  . chlorhexidine gluconate (MEDLINE KIT)  15 mL Mouth Rinse BID  . free water  250 mL Per Tube Q6H  . Immune Globulin 10%  15 g Intravenous Once per day on Sun Tue  . Immune Globulin 10%  20 g Intravenous Once per day on Mon Wed Sat  . insulin aspart  1-3 Units Subcutaneous  Q4H  . insulin glargine  10 Units Subcutaneous QHS  . levETIRAcetam  1,500 mg Intravenous Q12H  . mouth rinse  15 mL Mouth Rinse 10 times per day  . methylPREDNISolone (SOLU-MEDROL) injection  500 mg Intravenous Q12H  . midodrine  20 mg Per Tube TID WC  . oxybutynin  5 mg Per Tube BID  . pantoprazole sodium  40 mg Per Tube Daily  . sodium chloride flush  10-40 mL Intracatheter Q12H  . topiramate  100 mg Oral BID  . 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 Comatose state likely secondary to diffuse cortical damage. 1. Most likely toxic/metabolic in etiology. Seizures were initially thought to be antibiotic related. He was on antibiotics which lower seizure threshold (imipenem) and initial seizures may have been precipitated by such. However, he has been off of all antibiotics for about two weeks and has not improved. .  2. 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  3. DDx also includes neurotoxicity secondary to beta-lactam antibiotics, as his mother reports multiple episodes of decreased mental status, including obtundation, with beta-lactam antibiotics in the past.  4. Pontine infarct. Anti-platelet therapy has been held due to thrombocytopenia.  5. Due to infection in lumbar area, tap was delayed and cervical puncture was pursued without evidence of infection. Elevated protein of unclear significance as this can be seen in many disorders, including status epilepticus.  6. Repeat MRI 12/21 with no evidence of injury.   7. Repeat continuous EEG 12/19-12/20 without evidence of ongiong seizure activity.  8. 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 An autoimmune encephalopathy that flares with infections is a consideration.  It is felt that a trial of steroids/IVIG would be reasonable given his persistent dismal exam.   Recommendations: 1. Continue Depakote 1gm Q6H 2. Continue Keppra 1539m BID 3. Continue Topamax 1017mBID 4. IVIG 2g/kg div over  5 days, today is day 3 (12/25). 5. Solumedrol 1gm daily x 5 days, today is day 3 6. Holding ASA due to thrombocytopenia.     LOS: 36 days   @Electronically  signed: Dr. Kerney Elbe 10/01/2016  10:11 AM

## 2016-10-01 NOTE — Progress Notes (Signed)
[place] Progress Note Patient Name: REIK INGERSON DOB: September 07, 1986 MRN: [id number]   Date of Service  10/01/2016  HPI/Events of Note  Falling Ur OP per bedsideRN  eICU Interventions  1L fluid bolus     Intervention Category Intermediate Interventions: Oliguria - evaluation and management  Justun Anaya 10/01/2016, 12:31 AM

## 2016-10-01 NOTE — Progress Notes (Signed)
Patient ID: Jason [name], male   DOB: 03/22/1986, 30 y.o.   MRN: [id number]   Asked to evaluate leaking G tube  G tube bandaged with multiple 4x4 gauze Many beneath bumper - between bumper and skin site.  All removed Bumper cinched to skin 1 4x4 gauze dressing placed.  Will recheck in am If continues to leak May need exchange

## 2016-10-02 DIAGNOSIS — Z930 Tracheostomy status: Secondary | ICD-10-CM

## 2016-10-02 DIAGNOSIS — A498 Other bacterial infections of unspecified site: Secondary | ICD-10-CM

## 2016-10-02 LAB — BASIC METABOLIC PANEL
Anion gap: 10 (ref 5–15)
Anion gap: 6 (ref 5–15)
BUN: 52 mg/dL — AB (ref 6–20)
BUN: 59 mg/dL — AB (ref 6–20)
CALCIUM: 8.9 mg/dL (ref 8.9–10.3)
CHLORIDE: 122 mmol/L — AB (ref 101–111)
CO2: 10 mmol/L — ABNORMAL LOW (ref 22–32)
CO2: 12 mmol/L — AB (ref 22–32)
Calcium: 8.4 mg/dL — ABNORMAL LOW (ref 8.9–10.3)
Chloride: 123 mmol/L — ABNORMAL HIGH (ref 101–111)
Creatinine, Ser: 0.9 mg/dL (ref 0.61–1.24)
Creatinine, Ser: 1.16 mg/dL (ref 0.61–1.24)
GFR calc Af Amer: >60 mL/min (ref >60)
GFR calc Af Amer: >60 mL/min (ref >60)
GFR calc non Af Amer: >60 mL/min (ref >60)
Glucose, Bld: 42 mg/dL — CL (ref 65–99)
Glucose, Bld: 362 mg/dL — ABNORMAL HIGH (ref 65–99)
POTASSIUM: 2.5 mmol/L — AB (ref 3.5–5.1)
POTASSIUM: 4.4 mmol/L (ref 3.5–5.1)
SODIUM: 143 mmol/L (ref 135–145)
SODIUM: 140 mmol/L (ref 135–145)

## 2016-10-02 LAB — CBC
HCT: 26.2 % — ABNORMAL LOW (ref 39.0–52.0)
Hemoglobin: 8.4 g/dL — ABNORMAL LOW (ref 13.0–17.0)
MCH: 30 pg (ref 26.0–34.0)
MCHC: 32.1 g/dL (ref 30.0–36.0)
MCV: 93.6 fL (ref 78.0–100.0)
PLATELETS: 11 10*3/uL — AB (ref 150–400)
RBC: 2.8 MIL/uL — AB (ref 4.22–5.81)
RDW: 21.3 % — ABNORMAL HIGH (ref 11.5–15.5)
WBC: 15.2 10*3/uL — ABNORMAL HIGH (ref 4.0–10.5)

## 2016-10-02 LAB — GLUCOSE, CAPILLARY
GLUCOSE-CAPILLARY: 31 mg/dL — AB (ref 65–99)
GLUCOSE-CAPILLARY: 209 mg/dL — AB (ref 65–99)
GLUCOSE-CAPILLARY: 124 mg/dL — AB (ref 65–99)
GLUCOSE-CAPILLARY: 161 mg/dL — AB (ref 65–99)
Glucose-Capillary: 177 mg/dL — ABNORMAL HIGH (ref 65–99)
Glucose-Capillary: 184 mg/dL — ABNORMAL HIGH (ref 65–99)
Glucose-Capillary: 197 mg/dL — ABNORMAL HIGH (ref 65–99)
Glucose-Capillary: 324 mg/dL — ABNORMAL HIGH (ref 65–99)
Glucose-Capillary: 57 mg/dL — ABNORMAL LOW (ref 65–99)

## 2016-10-02 LAB — MAGNESIUM
MAGNESIUM: 2 mg/dL (ref 1.7–2.4)
Magnesium: 1.7 mg/dL (ref 1.7–2.4)

## 2016-10-02 LAB — PROTIME-INR
INR: 3.8
PROTHROMBIN TIME: 38.4 s — AB (ref 11.4–15.2)

## 2016-10-02 LAB — APTT
aPTT: 122 seconds — ABNORMAL HIGH (ref 24–36)
aPTT: 112 seconds — ABNORMAL HIGH (ref 24–36)

## 2016-10-02 LAB — PHOSPHORUS
PHOSPHORUS: 3.7 mg/dL (ref 2.5–4.6)
Phosphorus: 4.4 mg/dL (ref 2.5–4.6)

## 2016-10-02 MED ORDER — SODIUM CHLORIDE 0.9 % IV BOLUS (SEPSIS)
1000.0000 mL | Freq: Once | INTRAVENOUS | Status: AC
  Administered 2016-10-02: 1000 mL via INTRAVENOUS

## 2016-10-02 MED ORDER — DEXTROSE 50 % IV SOLN
INTRAVENOUS | Status: AC
  Filled 2016-10-02: qty 50

## 2016-10-02 MED ORDER — VITAL AF 1.2 CAL PO LIQD
1000.0000 mL | ORAL | Status: DC
  Administered 2016-10-02 – 2016-10-05 (×5): 1000 mL

## 2016-10-02 MED ORDER — DEXTROSE 50 % IV SOLN
INTRAVENOUS | Status: AC
  Administered 2016-10-02: 25 mL
  Filled 2016-10-02: qty 50

## 2016-10-02 MED ORDER — SODIUM CHLORIDE 0.9 % IV SOLN
30.0000 meq | INTRAVENOUS | Status: AC
  Administered 2016-10-02 (×3): 30 meq via INTRAVENOUS
  Filled 2016-10-02 (×4): qty 15

## 2016-10-02 MED ORDER — VITAL HIGH PROTEIN PO LIQD: 1000.0000 mL | ORAL | Status: DC

## 2016-10-02 MED ORDER — DEXTROSE 5 % IV SOLN
0.0000 ug/min | INTRAVENOUS | Status: DC
  Administered 2016-10-02: 10 ug/min via INTRAVENOUS
  Administered 2016-10-03: 40 ug/min via INTRAVENOUS
  Administered 2016-10-03: 30 ug/min via INTRAVENOUS
  Administered 2016-10-03 (×2): 40 ug/min via INTRAVENOUS
  Administered 2016-10-03 – 2016-10-04 (×3): 50 ug/min via INTRAVENOUS
  Filled 2016-10-02 (×8): qty 1

## 2016-10-02 MED ORDER — MAGNESIUM SULFATE 2 GM/50ML IV SOLN
2.0000 g | Freq: Once | INTRAVENOUS | Status: AC
  Administered 2016-10-02: 2 g via INTRAVENOUS
  Filled 2016-10-02: qty 50

## 2016-10-02 NOTE — Progress Notes (Signed)
Rechecked glucose post 15 min admin of d50: 197.  Pharmacy notified for the following critical values:  Potassium: 2.5 Glucose: 42 (from blood sample prior to d50 admin) Platelets: 11  Elink called and notified of critical values above and blood glucose recheck 15 minutes post admin of d50.  Awaiting further orders.  Will continue to monitor.  Clyda Hurdle RN  [phone] 10/02/2016

## 2016-10-02 NOTE — Progress Notes (Signed)
[facility] Progress Note Patient Name: Jason [name] DOB: 1986/06/08 MRN: [id number]   Date of Service  10/02/2016  HPI/Events of Note  Hypotension - BP = 76/48. Currently on a Versed IV infusion for seizure suppression.   eICU Interventions  Will order: 1. Bolus with 0.9 NaCl 1 liter IV over 1 hour now.  2. Monitor CVP.     Intervention Category Major Interventions: Hypotension - evaluation and management  Lysle Dingwall 10/02/2016, 7:43 PM

## 2016-10-02 NOTE — Progress Notes (Signed)
Patient noted to be hypoglycemic at 0400 CBG check, Glucose (31).  Rechecked Glucose (57).  ICU hypoglycemic protocol followed, given 86mL dextrose amp.  Notified Elink.  Will recheck in 15 post administration.  Will continue to monitor.  Clyda Hurdle RN

## 2016-10-02 NOTE — Progress Notes (Signed)
  Checked gastrostomy tube site. Trickle feeds have been started. No evidence of leaking around tube and dressing is clean and dry.  Bumper remains in good position against skin.  Ok to resume usual tube feeds.  Dorismar Chay S Isabele Lollar PA-C 10/02/2016 10:08 AM

## 2016-10-02 NOTE — Progress Notes (Addendum)
Nutrition Consult/Follow Up  DOCUMENTATION CODES:   Underweight  INTERVENTION:    D/C Vital High Protein   Re-start Vital AF 1.2 at new goal rate of 75 ml/hr  TF regimen to provide 2160 kcals, 135 gm protein, 1459 ml of free water  NUTRITION DIAGNOSIS:   Increased nutrient needs related to wound healing as evidenced by estimated needs, ongoing  GOAL:   Patient will meet greater than or equal to 90% of their needs, met  MONITOR:   TF tolerance, Vent status, Labs, Weight trends, Skin, I & O's  ASSESSMENT:   30 y.o. 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  Patient typically receives nocturnal TF with Glucerna 1.2 and eats regular foods during the day. Pt transferred to 2S-SICU 12/4 due to new onset refractory status epilepticus.  Patient is currently on ventilator support >> trach  MV: 12.4 L/min Temp (24hrs), Avg:97 F (36.1 C), Min:93.3 F (34.1 C), Max:99.3 F (37.4 C)  Pt s/p C1-C2 fluoro guided puncture for CSF per IR 12/18. Vital AF 1.2 formula held 12/23 due to PEG malfunction. Vital High Protein formula restarted and now infusing at 65 ml/hr. Free water flushes at 250 ml every 6 hours. Infectious Disease continues to follow. CBG's 197-209-177.  Pt's weight is now highly fluctuating >> feel it is appropriate to increase TF rate at this time.  Diet Order:  Diet NPO time specified Except for: Sips with Meds  Skin:  Wound (see comment) (Stg 2 R elbow, back; stg 2 L heel; stg 4 coccyx, buttocks)  Last BM:  12/26  Height:   Ht Readings from Last 1 Encounters:  09/22/16 5' 8" (1.727 m)    Weight:   Wt Readings from Last 1 Encounters:  10/02/16 110 lb (49.9 kg)    12/25  108 lb 12/24  106 lb 12/23   95 lb 12/22   97 lb 12/21   96 lb 12/20  113 lb 12/19  122 lb 12/18  112 lb 12/17  106 lb 12/16  111 lb 12/15  113 lb 12/14  113 lb  Ideal Body Weight:  63 kg (adjusted for quadriplegia)  BMI:  Body mass index is 16.73 kg/m.  Re-estimated Nutritional Needs:   Kcal:  2000-2200  Protein:  125-135 gm  Fluid:  per MD  EDUCATION NEEDS:   No education needs identified at this time   Katie , RD, LDN Pager #: [phone] After-Hours Pager #: [phone]   

## 2016-10-02 NOTE — Progress Notes (Addendum)
INFECTIOUS DISEASE PROGRESS NOTE  ID: Jason [name] is a 30 y.o. male with  Active Problems:   Gastroparesis due to DM ([place])   Altered mental status   Neurogenic bladder   Suprapubic catheter ([place])   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 ([place])   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([place])   History of pulmonary embolism   DVT (deep venous thrombosis) ([name])   Severe protein-calorie malnutrition ([place])   Dysphagia, pharyngoesophageal phase   Decubitus ulcer of right perineal ischial region, stage 4 ([name])   Decubitus ulcer of left perineal ischial region, stage 4 ([place])   Sacral decubitus ulcer, stage IV ([name])   Multiple allergies   Recurrent UTI   Respiratory failure, chronic ([name])   Aspiration pneumonia of right lower lobe ([name])   Seizures ([name])   Anxiety state   Anemia of chronic disease   Hyponatremia   Encephalopathy   Other acute osteomyelitis, multiple sites ([place])   Lower GI bleed   Viridans streptococci infection   Coma of unknown cause ([name])   Septic shock ([name])   Anaphylactic syndrome   Acute hypoxemic respiratory failure ([name])   Enterococcal infection   Thrombocytopenia ([name])   Status epilepticus ([name])   Chronic respiratory failure with hypoxia ([name])   Acute respiratory acidosis   Hypotension   Pulmonary embolus ([name])  Subjective: Had facial twitching with decreased benzo.  Has not woken up per nursing.  Now with UCx +  Abtx:  Anti-infectives    Start     Dose/Rate Route Frequency Ordered Stop   09/30/16 1700  cefTAZidime (FORTAZ) 1 g in dextrose 5 % 50 mL IVPB  Status:  Discontinued     1 g 100 mL/hr over 30 Minutes Intravenous Every 8 hours 09/30/16 1613 09/30/16 1618   09/30/16 1700  cefTAZidime (FORTAZ) 1 g in dextrose 5 % 50 mL IVPB     1 g 100 mL/hr over 30 Minutes Intravenous Every 8 hours 09/30/16 1619     09/11/16 2200  ceFEPIme (MAXIPIME) 2 g in dextrose 5 % 50 mL IVPB  Status:  Discontinued     2 g 100 mL/hr over 30 Minutes  Intravenous Every 8 hours 09/11/16 1830 09/11/16 1838   09/11/16 2200  ceFEPIme (MAXIPIME) 2 g in dextrose 5 % 50 mL IVPB  Status:  Discontinued     2 g 100 mL/hr over 30 Minutes Intravenous Every 8 hours 09/11/16 1838 09/15/16 1441   09/11/16 2000  ceFEPIme (MAXIPIME) 2 g in dextrose 5 % 50 mL IVPB  Status:  Discontinued     2 g 100 mL/hr over 30 Minutes Intravenous Every 8 hours 09/11/16 1813 09/11/16 1830   09/11/16 1400  meropenem (MERREM) 1 g in sodium chloride 0.9 % 100 mL IVPB  Status:  Discontinued     1 g 200 mL/hr over 30 Minutes Intravenous Every 8 hours 09/11/16 1150 09/11/16 1813   09/11/16 1400  sulfamethoxazole-trimethoprim (BACTRIM) 240 mg of trimethoprim in dextrose 5 % 250 mL IVPB  Status:  Discontinued     240 mg of trimethoprim 265 mL/hr over 60 Minutes Intravenous Every 8 hours 09/11/16 1156 09/15/16 1441   09/10/16 2100  levofloxacin (LEVAQUIN) IVPB 750 mg  Status:  Discontinued     750 mg 100 mL/hr over 90 Minutes Intravenous Every 24 hours 09/10/16 1849 09/11/16 1115   09/10/16 2000  ceFEPIme (MAXIPIME) 2 g in dextrose 5 % 50 mL IVPB  Status:  Discontinued  2 g 100 mL/hr over 30 Minutes Intravenous Every 8 hours 09/10/16 1847 09/11/16 1140   09/08/16 1700  imipenem-cilastatin (PRIMAXIN) 1,000 mg in sodium chloride 0.9 % 250 mL IVPB  Status:  Discontinued     1,000 mg 250 mL/hr over 60 Minutes Intravenous Every 8 hours 09/08/16 1159 09/10/16 1736   09/01/16 1300  anidulafungin (ERAXIS) 100 mg in sodium chloride 0.9 % 100 mL IVPB  Status:  Discontinued     100 mg over 90 Minutes Intravenous Every 24 hours 08/31/16 1240 09/02/16 1252   09/01/16 1200  vancomycin (VANCOCIN) IVPB 750 mg/150 ml premix  Status:  Discontinued     750 mg 150 mL/hr over 60 Minutes Intravenous Every 24 hours 09/01/16 1145 09/01/16 1712   08/31/16 1700  imipenem-cilastatin (PRIMAXIN) 500 mg in sodium chloride 0.9 % 100 mL IVPB  Status:  Discontinued     500 mg 200 mL/hr over 30 Minutes  Intravenous Every 8 hours 08/31/16 1240 09/08/16 1159   08/31/16 1300  anidulafungin (ERAXIS) 200 mg in sodium chloride 0.9 % 200 mL IVPB     200 mg over 180 Minutes Intravenous  Once 08/31/16 1240 08/31/16 1810   08/10/2016 0557  tigecycline (TYGACIL) 50 mg in sodium chloride 0.9 % 100 mL IVPB  Status:  Discontinued     50 mg 200 mL/hr over 30 Minutes Intravenous Every 12 hours 08/28/16 1758 08/31/16 1151   08/28/16 1800  tigecycline (TYGACIL) 100 mg in sodium chloride 0.9 % 100 mL IVPB  Status:  Discontinued     100 mg 200 mL/hr over 30 Minutes Intravenous  Once 08/28/16 1758 08/31/16 1424   08/28/16 1300  linezolid (ZYVOX) IVPB 600 mg  Status:  Discontinued     600 mg 300 mL/hr over 60 Minutes Intravenous Every 12 hours 08/28/16 1239 08/31/16 2014   08/28/16 0200  aztreonam (AZACTAM) 1 g in dextrose 5 % 50 mL IVPB  Status:  Discontinued     1 g 100 mL/hr over 30 Minutes Intravenous Every 8 hours 08/27/16 1802 08/28/16 1232   08/27/16 2000  vancomycin (VANCOCIN) 500 mg in sodium chloride 0.9 % 100 mL IVPB  Status:  Discontinued     500 mg 100 mL/hr over 60 Minutes Intravenous Every 12 hours 08/27/16 1802 08/28/16 1239   08/27/16 1800  aztreonam (AZACTAM) 2 g in dextrose 5 % 50 mL IVPB     2 g 100 mL/hr over 30 Minutes Intravenous  Once 08/27/16 1634 08/27/16 1852   08/27/16 1600  levofloxacin (LEVAQUIN) IVPB 750 mg  Status:  Discontinued     750 mg 100 mL/hr over 90 Minutes Intravenous Every 24 hours 08/25/2016 1324 08/13/2016 1522   08/27/16 0000  vancomycin (VANCOCIN) 500 mg in sodium chloride 0.9 % 100 mL IVPB  Status:  Discontinued     500 mg 100 mL/hr over 60 Minutes Intravenous Every 12 hours 08/11/2016 1324 08/16/2016 1522   08/15/2016 2200  aztreonam (AZACTAM) 1 g in dextrose 5 % 50 mL IVPB  Status:  Discontinued     1 g 100 mL/hr over 30 Minutes Intravenous Every 8 hours 09/02/2016 1324 08/13/2016 1522   08/24/2016 1600  linezolid (ZYVOX) tablet 600 mg  Status:  Discontinued     600 mg Oral  Every 12 hours 08/15/2016 1458 08/27/16 1347   08/09/2016 1300  levofloxacin (LEVAQUIN) IVPB 750 mg  Status:  Discontinued     750 mg 100 mL/hr over 90 Minutes Intravenous  Once 08/23/2016 1246 08/21/2016 1522  08/13/2016 1300  aztreonam (AZACTAM) 2 g in dextrose 5 % 50 mL IVPB  Status:  Discontinued     2 g 100 mL/hr over 30 Minutes Intravenous  Once 08/10/2016 1246 08/25/2016 1522   08/11/2016 1300  vancomycin (VANCOCIN) IVPB 1000 mg/200 mL premix  Status:  Discontinued     1,000 mg 200 mL/hr over 60 Minutes Intravenous  Once 08/12/2016 1246 08/21/2016 1522      Medications:  Scheduled: . albuterol  2.5 mg Nebulization QID  . artificial tears   Both Eyes Q8H  . baclofen  10 mg Oral QID  . budesonide (PULMICORT) nebulizer solution  0.5 mg Nebulization BID  . cefTAZidime (FORTAZ)  IV  1 g Intravenous Q8H   And  . dexamethasone  4 mg Intravenous Q8H  . chlorhexidine gluconate (MEDLINE KIT)  15 mL Mouth Rinse BID  . feeding supplement (PRO-STAT SUGAR FREE 64)  30 mL Per Tube BID  . feeding supplement (VITAL HIGH PROTEIN)  1,000 mL Per Tube Q24H  . free water  250 mL Per Tube Q6H  . Immune Globulin 10%  15 g Intravenous Once per day on Sun Tue  . Immune Globulin 10%  20 g Intravenous Once per day on Mon Wed Sat  . insulin aspart  1-3 Units Subcutaneous Q4H  . levETIRAcetam  1,500 mg Intravenous Q12H  . mouth rinse  15 mL Mouth Rinse 10 times per day  . methylPREDNISolone (SOLU-MEDROL) injection  500 mg Intravenous Q12H  . midodrine  20 mg Per Tube TID WC  . oxybutynin  5 mg Per Tube BID  . pantoprazole sodium  40 mg Per Tube Daily  . potassium chloride (KCL MULTIRUN) 30 mEq in 265 mL IVPB  30 mEq Intravenous Q3H  . sodium chloride flush  10-40 mL Intracatheter Q12H  . topiramate  100 mg Oral BID  . valproate sodium  1,000 mg Intravenous Q4H    Objective: Vital signs in last 24 hours: Temp:  [93.3 F (34.1 C)-99.2 F (37.3 C)] 93.3 F (34.1 C) (12/26 0800) Pulse Rate:  [67-90] 75 (12/26  0900) Resp:  [21-41] 22 (12/26 0900) BP: (93-144)/(45-81) 108/45 (12/26 0900) SpO2:  [94 %-100 %] 95 % (12/26 0918) FiO2 (%):  [30 %] 30 % (12/26 0918) Weight:  [49.9 kg (110 lb)] 49.9 kg (110 lb) (12/26 0400)   General appearance: no distress Resp: rhonchi bilaterally Cardio: regular rate and rhythm GI: normal findings: non-distended, hypoactive BS  Lab Results  Recent Labs  10/01/16 0530 10/02/16 0418  WBC 19.7* 15.2*  HGB 8.6* 8.4*  HCT 26.4* 26.2*  NA 145 143  K 3.5 2.5*  CL 124* 123*  CO2 12* 10*  BUN 49* 52*  CREATININE 0.83 0.90   Liver Panel  Recent Labs  10/01/16 0530  PROT 6.1*  ALBUMIN 1.5*  AST 11*  ALT 8*  ALKPHOS 71  BILITOT 0.3  BILIDIR <0.1*  IBILI NOT CALCULATED   Sedimentation Rate No results for input(s): ESRSEDRATE in the last 72 hours. C-Reactive Protein No results for input(s): CRP in the last 72 hours.  Microbiology: Recent Results (from the past 240 hour(s))  Fungus Culture With Stain     Status: None (Preliminary result)   Collection Time: 09/27/2016 12:45 PM  Result Value Ref Range Status   Fungus Stain Final report  Final    Comment: (NOTE) Performed At: [facility] [place], [place] [id number] Lindon Romp MD QQ:[id number]    Fungus (Mycology) Culture  PENDING  Incomplete   Fungal Source CSF  Final  CSF culture with Stat gram stain     Status: None   Collection Time: 09/30/2016 12:45 PM  Result Value Ref Range Status   Specimen Description CSF  Final   Special Requests NONE  Final   Gram Stain   Final    WBC PRESENT,BOTH PMN AND MONONUCLEAR NO ORGANISMS SEEN CYTOSPIN SMEAR    Culture NO GROWTH 3 DAYS  Final   Report Status 09/28/2016 FINAL  Final  Fungus Culture Result     Status: None   Collection Time: 09/22/2016 12:45 PM  Result Value Ref Range Status   Result 1 Comment  Final    Comment: (NOTE) KOH/Calcofluor preparation:  no fungus observed. Performed At: [facility] [place], [place] [id number] Lindon Romp MD BS:[id number]   Culture, Urine     Status: Abnormal   Collection Time: 09/28/16 10:10 AM  Result Value Ref Range Status   Specimen Description URINE, SUPRAPUBIC  Final   Special Requests Normal  Final   Culture (A)  Final    >=100,000 COLONIES/mL KLEBSIELLA PNEUMONIAE Confirmed Extended Spectrum Beta-Lactamase Producer (ESBL) RESULT CALLED TO, READ BACK BY AND VERIFIED WITH: Agnes Lawrence, NURSE AT [phone] ON [id number] BY Rhea Bleacher RESULT REPEATED AND VERIFIED SINCE SENSITIVITIES WERE MULTI DRUG RESISTANT    Report Status 10/01/2016 FINAL  Final   Organism ID, Bacteria KLEBSIELLA PNEUMONIAE (A)  Final      Susceptibility   Klebsiella pneumoniae - MIC*    AMPICILLIN >=32 RESISTANT Resistant     CEFAZOLIN >=64 RESISTANT Resistant     CEFTRIAXONE >=64 RESISTANT Resistant     CIPROFLOXACIN >=4 RESISTANT Resistant     GENTAMICIN >=16 RESISTANT Resistant     IMIPENEM <=0.25 SENSITIVE Sensitive     NITROFURANTOIN 64 INTERMEDIATE Intermediate     TRIMETH/SULFA >=320 RESISTANT Resistant     AMPICILLIN/SULBACTAM >=32 RESISTANT Resistant     PIP/TAZO >=128 RESISTANT Resistant     Extended ESBL POSITIVE Resistant     * >=100,000 COLONIES/mL KLEBSIELLA PNEUMONIAE    Studies/Results: No results found.   Assessment/Plan: C6 Spinal Infarct Quadraplegia Prev Sepsis Streptococcal constellatus bacteremia Sacral Osteomyelitis Bone Bx 11-22 ESBL klebsiella Enterococcus (S- amp) Strep constellatus HCAP, aspiration Chronic Trach CXR worsening vascular congestion.   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 His WBC had increased slightly (16.8- 21.4- 17.6) His WBC has trended slightly down since then (15.2 tdoay) I would stop ceftaz as he is not clinically changed- his temp continues to have episodes of hypothermia, he continues to have occasional episodes of hypotension.  If concern that he has UTI-would consider checking his UA as well as UCx Would consider fosfomycin (not absorbed) at that point.  When speaking with neuro around his onset of seizures there was concern that cephalosporins could have been worsening his seizure disorder.   Family updated.         [name] Infectious Diseases (pager) [phone] [email or web address] 10/02/2016, 11:10 AM  LOS: 37 days

## 2016-10-02 NOTE — Progress Notes (Signed)
Name: Jason [name] MRN: [id number] DOB: 25-Dec-1985    ADMISSION DATE:  08/11/2016 CONSULTATION DATE:  11/21  REFERRING MD :  Dr. 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  He has hx of C6 spine injury with quadriplegia, tracheostomy status.  SUBJECTIVE:    RN reports no leakage from PEG.  Episodes of hypoglycemia overnight. K replaced per Elink.    VITAL SIGNS: BP (!) 108/45   Pulse 75   Temp 97.4 F (36.3 C) (Oral)   Resp (!) 22   Ht 5\' 8"  (1.727 m)   Wt 110 lb (49.9 kg)   SpO2 95%   BMI 16.73 kg/m   INTAKE/OUTPUT: I/O last 3 completed shifts: In: 3311.8 [I.V.:1797.3; Other:60; NG/GT:387.5; IV Piggyback:1067] Out: 1700 [Urine:1100; Stool:600]  PHYSICAL EXAMINATION: General: Chronically ill appearing young male in NAD trached on vent HEENT: Tracheostomy clean, no drainage, bloody oral secretions Cardiovascular:  s1s2 regular rhythm Pulmonary: resps even non labored on vent, few scattered rhonchi  Abdomen: Soft. Normal bowel sounds. Non distended, colostomy, PEG c/d/i Neurological: no response to verbal stimuli, pupils 3 mm =/R  EXT:  RUE>LUE edema   CMP Latest Ref Rng & Units 10/02/2016 10/01/2016 09/30/2016  Glucose 65 - 99 mg/dL 42(LL) 75 207(H)  BUN 6 - 20 mg/dL 52(H) 49(H) 36(H)  Creatinine 0.61 - 1.24 mg/dL 0.90 0.83 0.68  Sodium 135 - 145 mmol/L 143 145 143  Potassium 3.5 - 5.1 mmol/L 2.5(LL) 3.5 5.0  Chloride 101 - 111 mmol/L 123(H) 124(H) 123(H)  CO2 22 - 32 mmol/L 10(L) 12(L) 12(L)  Calcium 8.9 - 10.3 mg/dL 8.9 9.2 9.3  Total Protein 6.5 - 8.1 g/dL - 6.1(L) -  Total Bilirubin 0.3 - 1.2 mg/dL - 0.3 -  Alkaline Phos 38 - 126 U/L - 71 -  AST 15 - 41 U/L - 11(L) -  ALT 17 - 63 U/L - 8(L) -    CBC Latest Ref Rng & Units  10/02/2016 10/01/2016 10/01/2016  WBC 4.0 - 10.5 K/uL 15.2(H) 19.7(H) 13.7(H)  Hemoglobin 13.0 - 17.0 g/dL 8.4(L) 8.6(L) 5.8(LL)  Hematocrit 39.0 - 52.0 % 26.2(L) 26.4(L) 17.8(L)  Platelets 150 - 400 K/uL 11(LL) 15(LL) 9(LL)    Imaging: No results found.    SIGNIFICANT EVENTS: 11/22 - surgical bx sacral bone/wound 12/04 - transfer to ICU with status epilepticus 12/07 - eeg remains c poor neurostatus 12/17 - No seizures overnight, versed drip at 30mg /hr, intermittent hypothermia, decreased PEG tube drainage.  Versed d/c'd 12/18 - cervical puncture >> 12/25 - seizures, loaded with versed, IVIG per neuro  LINES: PICC LUE 12/15 >>  Trach (chronic) >>  STUDIES:  EEG 12/06 >> burst suppression MRI brain 12/09 >> acute infarct Lt paracentral mid pons 3 mm, chronic Rt cerebellar infarct, atrophy MRI Brain 12/22 >> persistant punctate diffusion abnormality at previously noted small left paramedian pontine infarct, small chronic right cerebellar infarcts, stable.  MICROBIOLOGY:  Blood Ctx x3 11/19:  3/3 Streptococcus constellatus  Urine Ctx 11/19:  Acinetobacter & Enterococcus faecalis MRSA PCR 11/20:  Positive Sacral Bone Ctx 11/22:  ESBL Kleb pna, Enterococcus faecalis, strep constellatus Blood Ctx x2 11/23:  Negative  Tracheal Asp Ctx 11/23:  Klebsiella pneumoniae & Acinetobacter Tracheal Asp Ctx 11/24:  Klebsiella pneumoniae & Acinetobacter Tracheal Asp Ctx 11/30:  Pseudomonas aeruginosa & Stenotrophomonas  Urine Culture 12/22 >> ESBL klebsiella >> S- imipenem  ANTIBIOTICS:  Tressie Ellis 12/24 >>   ASSESSMENT / PLAN:  Acute encephalopathy Status epilepticus >> possibly related to Abx vs meningitis  Pons infarct >> ?significance. CSF negative for infection (cervical puncture) C6 spine injury with quadriplegia. - AEDs per neurology > keppra, depakote, topamax - Versed load 12/25 for recurrent seizures, versed gtt - trying IVIgG again per Neuro, planned 7 days.  D4/7.   - serial  neuro exams   Acute on chronic respiratory failure. Tracheostomy status. - PRVC 8cc/kg - SBT when mental status permits - Defer trach change for now. - F/u CXR PRN.    HCAP, recurrent UTI, sacral osteomyelitis, sacral/heel wounds. Pseudomonas in sputum is sens to ceftaz only & klebs in urine is ESBL org Leucocytosis ESBL Klebsiella UTI - defer abx to ID - WOC for dressing recommendations - Trend CBC/ fever curve - will ask ID to see patient to assist with abx   Non-anion gap Metabolic acidosis in setting of hyperchloremia  Hypomagenesemia  Hypokalemia/ hypophos - Replace electrolytes as indicated - Monitor BMP / UOP  - Continue free water 250 q6   Anemia of critical illness Thrombocytopenia - changed to argatroban 12/23 with thrombocytopenia  - Trend CBC  - Transfuse per ICU protocol for hgb <7  - continue argatroban per pharmacy > not currently on due to elevated aPTT   Rt subclavian/brachial DVT 2nd to PICC line 11/27. 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 Chronic diastolic CHF. Hypotension 2nd to sedation. Thrombocytopenia on heparin ? HIT - ct Midodrine - follow HIT panel, changed heparin to argatroban per pharmacy 12/23 > not started yet due to aPTT elevation - monitor for bleeding    Severe protein calorie malnutrition. DM gastroparesis. PEG Malfunction - IR called 12/24 (advanced, 12/25)  - continue TF for nutrition, increase rate, monitor for leak - protonix for SUP   DM type 1. Hypoglycemic episodes 12/22, 12/25 - SSI-sensitive scale - D/C lantus with hypoglycemia events    FAMILY:  Father Timmothy Sours) updated at bedside 12/25. Mother [employer]) called for discussion.  They continue to have hope that he will wake up from this event.  They have seen him do this "5 times" in the past.  He has been intermittently on/off deep sedation in the setting of seizures.  Family feels that he has not quite ever  stabilized from a neuro standpoint to allow appropriate time for him to wake up.  They report they want to given him time after seizure control to allow for him to wake.  If after that time, he is not waking, they would consider "other routes" but not until then.  Continue full support for now.   CC Time: 12 minutes  Noe Gens, NP-C [facility] Pgr: [phone] or if no answer [phone] 10/02/2016, 9:34 AM  Attending Note:  30 year old male diabetic that resulted in a spinal infarct and becoming a quad.  Patient remains unresponsive.  Seizure recurred.  Neuro is following and on versed drip.  On exam, patient is completely unresponsive with coarse BS diffusely.  I reviewed CT myself, pleural effusion and trach in good position noted.  ESBL klebsiella in urine noted.  Will consult ID to evaluate need for abx.  Will continue full vent support.  Long discussion with family, they are awaiting prognostication from neuro to make decision.  Rayane has done this before on more than occasion but seizure did not last for that long.  Will continue support for now.  The patient is critically ill with multiple organ systems failure and requires high complexity decision making for assessment and support, frequent evaluation and titration of therapies, application of advanced monitoring technologies and extensive interpretation of multiple databases.   Critical Care Time devoted to patient care services described in this note is  35  Minutes. This time reflects time of care of this signee Dr Jennet Maduro. This critical care time does not reflect procedure time, or teaching time or supervisory time of PA/NP/Med student/Med Resident etc but could involve care discussion time.  Rush Farmer, M.D. [facility] Pulmonary/Critical Care Medicine. Pager: [phone]. After hours pager: [phone].

## 2016-10-02 NOTE — Progress Notes (Signed)
[facility] Progress Note Patient Name: Jason [name] DOB: 10-14-1985 MRN: [id number]   Date of Service  10/02/2016  HPI/Events of Note  Hypotension - BP = 82/45. CVP = 8.  eICU Interventions  Will order: 1. 0.9 NaCl 1 liter IV over 1 hour now.  2. Phenylephrine IV infusion. Titrate to MAP >= 65.     Intervention Category Intermediate Interventions: Hypotension - evaluation and management  Lysle Dingwall 10/02/2016, 9:44 PM

## 2016-10-02 NOTE — Progress Notes (Signed)
Subjective: Spoke with the patient's nurse. No further seizure like activity reported. Pt currently sedated but nurse reports no real change without sedation. No gag reflex.  Objective: Current vital signs: BP (!) 101/55   Pulse 97   Temp 99.3 F (37.4 C) (Oral)   Resp (!) 27   Ht _0  (1.727 m)   Wt 49.9 kg (110 lb)   SpO2 96%   BMI 16.73 kg/m  Vital signs in last 24 hours: Temp:  [93.3 F (34.1 C)-99.3 F (37.4 C)] 99.3 F (37.4 C) (12/26 1300) Pulse Rate:  [67-97] 97 (12/26 1300) Resp:  [21-41] 27 (12/26 1300) BP: (93-144)/(45-81) 101/55 (12/26 1300) SpO2:  [94 %-99 %] 96 % (12/26 1300) FiO2 (%):  [30 %] 30 % (12/26 1134) Weight:  [49.9 kg (110 lb)] 49.9 kg (110 lb) (12/26 0400)  Intake/Output from previous day: 12/25 0701 - 12/26 0700 In: 2728.5 [I.V.:1613; NG/GT:387.5; IV Piggyback:668] Out: 1350 [Urine:750; Stool:600] Intake/Output this shift: Total I/O In: 300 [I.V.:150; Other:150] Out: -  Nutritional status: Diet NPO time specified Except for: Sips with Meds  Neurologic Exam: Ment: Comatose. No volitional responses to any stimuli.  CN: Roving EOM. Pupils sluggish and minimally reactive. No doll's eye reflex present. Eyes are tonincally deviated downwards. Face flaccidly symmetric.  Motor/Sensory: Quadriplegic with no movement to stimuli and flaccid tone x 4.   Lab Results: Results for orders placed or performed during the hospital encounter of 09/04/2016 (from the past 48 hour(s))  APTT     Status: Abnormal   Collection Time: 09/30/16  2:36 PM  Result Value Ref Range   aPTT 120 (H) 24 - 36 seconds    Comment:        IF BASELINE aPTT IS ELEVATED, SUGGEST PATIENT RISK ASSESSMENT BE USED TO DETERMINE APPROPRIATE ANTICOAGULANT THERAPY.   Glucose, capillary     Status: Abnormal   Collection Time: 09/30/16  3:45 PM  Result Value Ref Range   Glucose-Capillary 143 (H) 65 - 99 mg/dL  APTT     Status: Abnormal   Collection Time: 09/30/16  4:26 PM  Result Value  Ref Range   aPTT 120 (H) 24 - 36 seconds    Comment:        IF BASELINE aPTT IS ELEVATED, SUGGEST PATIENT RISK ASSESSMENT BE USED TO DETERMINE APPROPRIATE ANTICOAGULANT THERAPY.   Glucose, capillary     Status: Abnormal   Collection Time: 09/30/16  7:31 PM  Result Value Ref Range   Glucose-Capillary 131 (H) 65 - 99 mg/dL   Comment 1 Capillary Specimen    Comment 2 Notify RN   Glucose, capillary     Status: Abnormal   Collection Time: 09/30/16  7:55 PM  Result Value Ref Range   Glucose-Capillary 137 (H) 65 - 99 mg/dL  APTT     Status: Abnormal   Collection Time: 09/30/16  9:35 PM  Result Value Ref Range   aPTT 126 (H) 24 - 36 seconds    Comment:        IF BASELINE aPTT IS ELEVATED, SUGGEST PATIENT RISK ASSESSMENT BE USED TO DETERMINE APPROPRIATE ANTICOAGULANT THERAPY.   Glucose, capillary     Status: Abnormal   Collection Time: 09/30/16 11:37 PM  Result Value Ref Range   Glucose-Capillary 137 (H) 65 - 99 mg/dL   Comment 1 Capillary Specimen   Glucose, capillary     Status: None   Collection Time: 10/01/16  3:42 AM  Result Value Ref Range   Glucose-Capillary 84 65 -  99 mg/dL   Comment 1 Capillary Specimen    Comment 2 Notify RN    Comment 3 Document in Chart   APTT     Status: Abnormal   Collection Time: 10/01/16  4:36 AM  Result Value Ref Range   aPTT 124 (H) 24 - 36 seconds    Comment:        IF BASELINE aPTT IS ELEVATED, SUGGEST PATIENT RISK ASSESSMENT BE USED TO DETERMINE APPROPRIATE ANTICOAGULANT THERAPY.   CBC     Status: Abnormal   Collection Time: 10/01/16  4:36 AM  Result Value Ref Range   WBC 13.7 (H) 4.0 - 10.5 K/uL   RBC 1.93 (L) 4.22 - 5.81 MIL/uL   Hemoglobin 5.8 (LL) 13.0 - 17.0 g/dL    Comment: REPEATED TO VERIFY CRITICAL RESULT CALLED TO, READ BACK BY AND VERIFIED WITH: S.LEE,RN 0503 10/01/16 M.CAMPBELL    HCT 17.8 (L) 39.0 - 52.0 %   MCV 92.2 78.0 - 100.0 fL   MCH 30.1 26.0 - 34.0 pg   MCHC 32.6 30.0 - 36.0 g/dL   RDW 20.9 (H) 11.5 -  15.5 %   Platelets 9 (LL) 150 - 400 K/uL    Comment: SPECIMEN CHECKED FOR CLOTS REPEATED TO VERIFY CRITICAL VALUE NOTED.  VALUE IS CONSISTENT WITH PREVIOUSLY REPORTED AND CALLED VALUE.   Type and screen [place]     Status: None   Collection Time: 10/01/16  5:11 AM  Result Value Ref Range   ABO/RH(D) O POS    Antibody Screen NEG    Sample Expiration 10/04/2016   Hepatic function panel     Status: Abnormal   Collection Time: 10/01/16  5:30 AM  Result Value Ref Range   Total Protein 6.1 (L) 6.5 - 8.1 g/dL   Albumin 1.5 (L) 3.5 - 5.0 g/dL   AST 11 (L) 15 - 41 U/L   ALT 8 (L) 17 - 63 U/L   Alkaline Phosphatase 71 38 - 126 U/L   Total Bilirubin 0.3 0.3 - 1.2 mg/dL   Bilirubin, Direct <0.1 (L) 0.1 - 0.5 mg/dL   Indirect Bilirubin NOT CALCULATED 0.3 - 0.9 mg/dL  CBC     Status: Abnormal   Collection Time: 10/01/16  5:30 AM  Result Value Ref Range   WBC 19.7 (H) 4.0 - 10.5 K/uL   RBC 2.86 (L) 4.22 - 5.81 MIL/uL   Hemoglobin 8.6 (L) 13.0 - 17.0 g/dL    Comment: DELTA CHECK NOTED RESULTS VERIFIED VIA RECOLLECT    HCT 26.4 (L) 39.0 - 52.0 %   MCV 92.3 78.0 - 100.0 fL   MCH 30.1 26.0 - 34.0 pg   MCHC 32.6 30.0 - 36.0 g/dL   RDW 20.7 (H) 11.5 - 15.5 %   Platelets 15 (LL) 150 - 400 K/uL    Comment: RESULTS VERIFIED VIA RECOLLECT PLATELET COUNT CONFIRMED BY SMEAR REPEATED TO VERIFY CRITICAL VALUE NOTED.  VALUE IS CONSISTENT WITH PREVIOUSLY REPORTED AND CALLED VALUE.   Protime-INR     Status: Abnormal   Collection Time: 10/01/16  5:30 AM  Result Value Ref Range   Prothrombin Time 32.7 (H) 11.4 - 15.2 seconds   INR 3.10   APTT     Status: Abnormal   Collection Time: 10/01/16  5:30 AM  Result Value Ref Range   aPTT 126 (H) 24 - 36 seconds    Comment:        IF BASELINE aPTT IS ELEVATED, SUGGEST PATIENT RISK ASSESSMENT BE  USED TO DETERMINE APPROPRIATE ANTICOAGULANT THERAPY.   Basic metabolic panel     Status: Abnormal   Collection Time: 10/01/16  5:30 AM   Result Value Ref Range   Sodium 145 135 - 145 mmol/L   Potassium 3.5 3.5 - 5.1 mmol/L    Comment: DELTA CHECK NOTED   Chloride 124 (H) 101 - 111 mmol/L   CO2 12 (L) 22 - 32 mmol/L   Glucose, Bld 75 65 - 99 mg/dL   BUN 49 (H) 6 - 20 mg/dL   Creatinine, Ser 0.83 0.61 - 1.24 mg/dL   Calcium 9.2 8.9 - 10.3 mg/dL   GFR calc non Af Amer >60 >60 mL/min   GFR calc Af Amer >60 >60 mL/min    Comment: (NOTE) The eGFR has been calculated using the CKD EPI equation. This calculation has not been validated in all clinical situations. eGFR's persistently <60 mL/min signify possible Chronic Kidney Disease.    Anion gap 9 5 - 15  Magnesium     Status: None   Collection Time: 10/01/16  5:30 AM  Result Value Ref Range   Magnesium 1.7 1.7 - 2.4 mg/dL  Phosphorus     Status: None   Collection Time: 10/01/16  5:30 AM  Result Value Ref Range   Phosphorus 4.5 2.5 - 4.6 mg/dL  Glucose, capillary     Status: Abnormal   Collection Time: 10/01/16  7:34 AM  Result Value Ref Range   Glucose-Capillary 51 (L) 65 - 99 mg/dL  Glucose, capillary     Status: None   Collection Time: 10/01/16  8:30 AM  Result Value Ref Range   Glucose-Capillary 87 65 - 99 mg/dL  Glucose, capillary     Status: Abnormal   Collection Time: 10/01/16 11:29 AM  Result Value Ref Range   Glucose-Capillary 60 (L) 65 - 99 mg/dL   Comment 1 Capillary Specimen    Comment 2 Notify RN   Glucose, capillary     Status: Abnormal   Collection Time: 10/01/16  3:22 PM  Result Value Ref Range   Glucose-Capillary 212 (H) 65 - 99 mg/dL  Magnesium     Status: Abnormal   Collection Time: 10/01/16  4:05 PM  Result Value Ref Range   Magnesium 1.5 (L) 1.7 - 2.4 mg/dL  Phosphorus     Status: None   Collection Time: 10/01/16  4:05 PM  Result Value Ref Range   Phosphorus 3.8 2.5 - 4.6 mg/dL  Glucose, capillary     Status: Abnormal   Collection Time: 10/01/16  7:59 PM  Result Value Ref Range   Glucose-Capillary 138 (H) 65 - 99 mg/dL  APTT      Status: Abnormal   Collection Time: 10/01/16  9:45 PM  Result Value Ref Range   aPTT 122 (H) 24 - 36 seconds    Comment:        IF BASELINE aPTT IS ELEVATED, SUGGEST PATIENT RISK ASSESSMENT BE USED TO DETERMINE APPROPRIATE ANTICOAGULANT THERAPY.   Protime-INR     Status: Abnormal   Collection Time: 10/01/16  9:45 PM  Result Value Ref Range   Prothrombin Time 38.4 (H) 11.4 - 15.2 seconds   INR 3.80   Glucose, capillary     Status: Abnormal   Collection Time: 10/01/16 11:32 PM  Result Value Ref Range   Glucose-Capillary 184 (H) 65 - 99 mg/dL  APTT     Status: Abnormal   Collection Time: 10/02/16  4:18 AM  Result Value Ref Range  aPTT 112 (H) 24 - 36 seconds    Comment:        IF BASELINE aPTT IS ELEVATED, SUGGEST PATIENT RISK ASSESSMENT BE USED TO DETERMINE APPROPRIATE ANTICOAGULANT THERAPY.   CBC     Status: Abnormal   Collection Time: 10/02/16  4:18 AM  Result Value Ref Range   WBC 15.2 (H) 4.0 - 10.5 K/uL   RBC 2.80 (L) 4.22 - 5.81 MIL/uL   Hemoglobin 8.4 (L) 13.0 - 17.0 g/dL   HCT 26.2 (L) 39.0 - 52.0 %   MCV 93.6 78.0 - 100.0 fL   MCH 30.0 26.0 - 34.0 pg   MCHC 32.1 30.0 - 36.0 g/dL   RDW 21.3 (H) 11.5 - 15.5 %   Platelets 11 (LL) 150 - 400 K/uL    Comment: SPECIMEN CHECKED FOR CLOTS REPEATED TO VERIFY CRITICAL VALUE NOTED.  VALUE IS CONSISTENT WITH PREVIOUSLY REPORTED AND CALLED VALUE.   Magnesium     Status: None   Collection Time: 10/02/16  4:18 AM  Result Value Ref Range   Magnesium 1.7 1.7 - 2.4 mg/dL  Phosphorus     Status: None   Collection Time: 10/02/16  4:18 AM  Result Value Ref Range   Phosphorus 3.7 2.5 - 4.6 mg/dL  Basic metabolic panel     Status: Abnormal   Collection Time: 10/02/16  4:18 AM  Result Value Ref Range   Sodium 143 135 - 145 mmol/L   Potassium 2.5 (LL) 3.5 - 5.1 mmol/L    Comment: CRITICAL RESULT CALLED TO, READ BACK BY AND VERIFIED WITH: LEE S,RN 10/02/16 0503 WAYK    Chloride 123 (H) 101 - 111 mmol/L   CO2 10 (L) 22 - 32  mmol/L   Glucose, Bld 42 (LL) 65 - 99 mg/dL    Comment: CRITICAL RESULT CALLED TO, READ BACK BY AND VERIFIED WITH: LEE S,RN 10/02/16 0503 WAYK    BUN 52 (H) 6 - 20 mg/dL   Creatinine, Ser 0.90 0.61 - 1.24 mg/dL   Calcium 8.9 8.9 - 10.3 mg/dL   GFR calc non Af Amer >60 >60 mL/min   GFR calc Af Amer >60 >60 mL/min    Comment: (NOTE) The eGFR has been calculated using the CKD EPI equation. This calculation has not been validated in all clinical situations. eGFR's persistently <60 mL/min signify possible Chronic Kidney Disease.    Anion gap 10 5 - 15  Glucose, capillary     Status: Abnormal   Collection Time: 10/02/16  4:34 AM  Result Value Ref Range   Glucose-Capillary 31 (LL) 65 - 99 mg/dL   Comment 1 Venous Specimen   Glucose, capillary     Status: Abnormal   Collection Time: 10/02/16  4:37 AM  Result Value Ref Range   Glucose-Capillary 57 (L) 65 - 99 mg/dL  Glucose, capillary     Status: Abnormal   Collection Time: 10/02/16  5:05 AM  Result Value Ref Range   Glucose-Capillary 197 (H) 65 - 99 mg/dL   Comment 1 Venous Specimen   Glucose, capillary     Status: Abnormal   Collection Time: 10/02/16  6:50 AM  Result Value Ref Range   Glucose-Capillary 209 (H) 65 - 99 mg/dL  Glucose, capillary     Status: Abnormal   Collection Time: 10/02/16  8:13 AM  Result Value Ref Range   Glucose-Capillary 177 (H) 65 - 99 mg/dL  Glucose, capillary     Status: Abnormal   Collection Time: 10/02/16 12:53 PM  Result Value Ref Range   Glucose-Capillary >600 (HH) 65 - 99 mg/dL  Glucose, capillary     Status: Abnormal   Collection Time: 10/02/16 12:57 PM  Result Value Ref Range   Glucose-Capillary 124 (H) 65 - 99 mg/dL   Comment 1 Venous Specimen    *Note: Due to a large number of results and/or encounters for the requested time period, some results have not been displayed. A complete set of results can be found in Results Review.    Recent Results (from the past 240 hour(s))  Fungus  Culture With Stain     Status: None (Preliminary result)   Collection Time: 09/27/2016 12:45 PM  Result Value Ref Range Status   Fungus Stain Final report  Final    Comment: (NOTE) Performed At: [facility] [place], [place] [id number] Lindon Romp MD IR:[id number]    Fungus (Mycology) Culture PENDING  Incomplete   Fungal Source CSF  Final  CSF culture with Stat gram stain     Status: None   Collection Time: 09/11/2016 12:45 PM  Result Value Ref Range Status   Specimen Description CSF  Final   Special Requests NONE  Final   Gram Stain   Final    WBC PRESENT,BOTH PMN AND MONONUCLEAR NO ORGANISMS SEEN CYTOSPIN SMEAR    Culture NO GROWTH 3 DAYS  Final   Report Status 09/28/2016 FINAL  Final  Fungus Culture Result     Status: None   Collection Time: 09/23/2016 12:45 PM  Result Value Ref Range Status   Result 1 Comment  Final    Comment: (NOTE) KOH/Calcofluor preparation:  no fungus observed. Performed At: [facility] [place], [place] [id number] Lindon Romp MD IZ:[id number]   Culture, Urine     Status: Abnormal   Collection Time: 09/28/16 10:10 AM  Result Value Ref Range Status   Specimen Description URINE, SUPRAPUBIC  Final   Special Requests Normal  Final   Culture (A)  Final    >=100,000 COLONIES/mL KLEBSIELLA PNEUMONIAE Confirmed Extended Spectrum Beta-Lactamase Producer (ESBL) RESULT CALLED TO, READ BACK BY AND VERIFIED WITH: Agnes Lawrence, NURSE AT [phone] ON [id number] BY Rhea Bleacher RESULT REPEATED AND VERIFIED SINCE SENSITIVITIES WERE MULTI DRUG RESISTANT    Report Status 10/01/2016 FINAL  Final   Organism ID, Bacteria KLEBSIELLA PNEUMONIAE (A)  Final      Susceptibility   Klebsiella pneumoniae - MIC*    AMPICILLIN >=32 RESISTANT Resistant     CEFAZOLIN >=64 RESISTANT Resistant     CEFTRIAXONE >=64 RESISTANT Resistant     CIPROFLOXACIN >=4 RESISTANT Resistant     GENTAMICIN >=16 RESISTANT Resistant     IMIPENEM <=0.25  SENSITIVE Sensitive     NITROFURANTOIN 64 INTERMEDIATE Intermediate     TRIMETH/SULFA >=320 RESISTANT Resistant     AMPICILLIN/SULBACTAM >=32 RESISTANT Resistant     PIP/TAZO >=128 RESISTANT Resistant     Extended ESBL POSITIVE Resistant     * >=100,000 COLONIES/mL KLEBSIELLA PNEUMONIAE    Lipid Panel No results for input(s): CHOL, TRIG, HDL, CHOLHDL, VLDL, LDLCALC in the last 72 hours.       Studies/Results:  MRI Brain With and Without Contrast 09/27/2016 1. Persistent punctate diffusion abnormality at previously identified small left paramedian pontine infarct. 2. No other new acute intracranial process. 3. Small chronic right cerebellar infarcts, stable.    Medications:  Scheduled: . albuterol  2.5 mg Nebulization QID  . artificial tears   Both Eyes Q8H  .  baclofen  10 mg Oral QID  . budesonide (PULMICORT) nebulizer solution  0.5 mg Nebulization BID  . chlorhexidine gluconate (MEDLINE KIT)  15 mL Mouth Rinse BID  . free water  250 mL Per Tube Q6H  . Immune Globulin 10%  15 g Intravenous Once per day on Sun Tue  . Immune Globulin 10%  20 g Intravenous Once per day on Mon Wed Sat  . insulin aspart  1-3 Units Subcutaneous Q4H  . levETIRAcetam  1,500 mg Intravenous Q12H  . mouth rinse  15 mL Mouth Rinse 10 times per day  . methylPREDNISolone (SOLU-MEDROL) injection  500 mg Intravenous Q12H  . midodrine  20 mg Per Tube TID WC  . oxybutynin  5 mg Per Tube BID  . pantoprazole sodium  40 mg Per Tube Daily  . potassium chloride (KCL MULTIRUN) 30 mEq in 265 mL IVPB  30 mEq Intravenous Q3H  . sodium chloride flush  10-40 mL Intracatheter Q12H  . topiramate  100 mg Oral BID  . 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 Comatose state likely secondary to diffuse cortical damage. 1. Most likely toxic/metabolic in etiology. Seizures were initially thought to be antibiotic  related. He was on antibiotics which lower seizure threshold (imipenem) and initial seizures may have been precipitated by such. However, he has been off of all antibiotics for about two weeks and has not improved. .  2. 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  3. DDx also includes neurotoxicity secondary to beta-lactam antibiotics, as his mother reports multiple episodes of decreased mental status, including obtundation, with beta-lactam antibiotics in the past.  4. Pontine infarct. Anti-platelet therapy has been held due to thrombocytopenia.  5. Due to infection in lumbar area, tap was delayed and cervical puncture was pursued without evidence of infection. Elevated protein of unclear significance as this can be seen in many disorders, including status epilepticus.  6. Repeat MRI 12/21 with no evidence of new injury.   7. Repeat continuous EEG 12/19-12/20 without evidence of ongiong seizure activity.  8. 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 An autoimmune encephalopathy that flares with infections is a consideration. It is felt that a trial of steroids/IVIG would be reasonable given his persistent dismal exam.   Recommendations: 1. Continue Depakote 1gm Q6H 2. Continue Keppra 1561m BID 3. Continue Topamax 1094mBID 4. IVIG 2g/kg div over 5 days, today is day 4 / 5 (12/26). 5. Solumedrol 1gm daily x 5 days, today is day 4 / 5 6. Holding ASA due to thrombocytopenia - plts - 11K 7. Temp 99.3 (climbing slightly)  No change in exam.  Continue current regimen.   DaMikey BussingA-C [facility] Pager ([phone]/26/2017, 2:51 PM   LOS: 37 days   Electronically signed: Dr. ErKerney Elbe

## 2016-10-02 NOTE — Progress Notes (Signed)
[facility] ICU Electrolyte Replacement Protocol  Patient Name: Jason [name] DOB: 11-19-1985 MRN: [id number]  Date of Service  10/02/2016   HPI/Events of Note    Recent Labs Lab 09/28/16 0530 09/29/16 0500 09/30/16 0254 10/01/16 0530 10/01/16 1605 10/02/16 0418  NA 152* 148* 143 145  --  143  K 3.3* 3.7 5.0 3.5  --  2.5*  CL 130* 127* 123* 124*  --  123*  CO2 16* 13* 12* 12*  --  10*  GLUCOSE 60* 216* 207* 75  --  42*  BUN 22* 23* 36* 49*  --  52*  CREATININE 0.56* 0.47* 0.68 0.83  --  0.90  CALCIUM 9.9 9.7 9.3 9.2  --  8.9  MG 1.7 2.1 1.8 1.7 1.5* 1.7  PHOS 2.7 1.8* 3.9 4.5 3.8 3.7    Estimated Creatinine Clearance: 83.2 mL/min (by C-G formula based on SCr of 0.9 mg/dL).  Intake/Output      12/25 0701 - 12/26 0700   I.V. (mL/kg) 1313 (26.3)   Other 60   NG/GT 387.5   IV Piggyback 614   Total Intake(mL/kg) 2374.5 (47.6)   Urine (mL/kg/hr) 700 (0.6)   Drains 0 (0)   Stool 450 (0.4)   Total Output 1150   Net +1224.5       Urine Occurrence 0 x   Stool Occurrence 0 x    - I/O DETAILED x24h    Total I/O In: 1419 [I.V.:804; Other:60; NG/GT:330; IV Piggyback:225] Out: 950 [Urine:500; Stool:450] - I/O THIS SHIFT    ASSESSMENT   eICURN Interventions  Abnormal labs replaced using ICU protocol   ASSESSMENT: Jason [name], Jason [name] 10/02/2016, 5:54 AM

## 2016-10-02 NOTE — Progress Notes (Signed)
ANTICOAGULATION CONSULT NOTE  Pharmacy Consult for Argatroban  Indication: pulmonary embolus and DVT  Patient Measurements: Height: 5\' 8"  (172.7 cm) Weight: 108 lb (49 kg) IBW/kg (Calculated) : 68.4  Vital Signs: Temp: 94.5 F (34.7 C) (12/25 2225) Temp Source: Oral (12/25 2225) BP: 113/53 (12/25 2300) Pulse Rate: 83 (12/25 2310)  Labs:  Recent Labs  09/29/16 0500 09/29/16 0927  09/30/16 0254  10/01/16 0436 10/01/16 0530 10/01/16 2145  HGB 7.0*  --   --  8.7*  --  5.8* 8.6*  --   HCT 21.6*  --   --  27.3*  --  17.8* 26.4*  --   PLT 50*  --   --  24*  --  9* 15*  --   APTT  --   --   < > 176*  < > 124* 126* 122*  LABPROT  --   --   --   --   --   --  32.7* 38.4*  INR  --   --   --   --   --   --  3.10 3.80  HEPARINUNFRC  --  0.31  --   --   --   --   --   --   CREATININE 0.47*  --   --  0.68  --   --  0.83  --   < > = values in this interval not displayed.  Estimated Creatinine Clearance: 88.6 mL/min (by C-G formula based on SCr of 0.83 mg/dL).  Assessment: 30 y/o Male with hx PE and new RUE DVT for anticoagulation.  aPTT remains elevated and INR increasing despite no anticoagulation since ~ noon 12/24.  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  Will continue to hold anticoagulation.  Currently receiving valproic acid 1000 mg IV q4h.  Valproic acid is associated with thrombocytopenia, especially at higher doses.  Consider alternative antiepileptics if possible.  Plan:  Continue to hold anticoagulation F/U am labs and plan with primary team in am  Alexiah Koroma, Bronson Curb 10/02/2016,12:34 AM

## 2016-10-03 ENCOUNTER — Inpatient Hospital Stay (HOSPITAL_COMMUNITY): Payer: Medicaid Other

## 2016-10-03 ENCOUNTER — Other Ambulatory Visit (HOSPITAL_COMMUNITY): Payer: Medicaid Other

## 2016-10-03 DIAGNOSIS — N179 Acute kidney failure, unspecified: Secondary | ICD-10-CM

## 2016-10-03 DIAGNOSIS — Z95828 Presence of other vascular implants and grafts: Secondary | ICD-10-CM

## 2016-10-03 DIAGNOSIS — K7200 Acute and subacute hepatic failure without coma: Secondary | ICD-10-CM

## 2016-10-03 LAB — URINALYSIS, ROUTINE W REFLEX MICROSCOPIC
Bilirubin Urine: NEGATIVE
GLUCOSE, UA: NEGATIVE mg/dL
KETONES UR: NEGATIVE mg/dL
Nitrite: NEGATIVE
PROTEIN: 100 mg/dL — AB
SQUAMOUS EPITHELIAL / LPF: NONE SEEN
Specific Gravity, Urine: 1.013 (ref 1.005–1.030)
pH: 5 (ref 5.0–8.0)

## 2016-10-03 LAB — DIC (DISSEMINATED INTRAVASCULAR COAGULATION) PANEL
APTT: 96 s — AB (ref 24–36)
FIBRINOGEN: 550 mg/dL — AB (ref 210–475)
INR: 2.33
PROTHROMBIN TIME: 26 s — AB (ref 11.4–15.2)

## 2016-10-03 LAB — GLUCOSE, CAPILLARY
GLUCOSE-CAPILLARY: 361 mg/dL — AB (ref 65–99)
Glucose-Capillary: 105 mg/dL — ABNORMAL HIGH (ref 65–99)
Glucose-Capillary: 279 mg/dL — ABNORMAL HIGH (ref 65–99)
Glucose-Capillary: 303 mg/dL — ABNORMAL HIGH (ref 65–99)
Glucose-Capillary: 340 mg/dL — ABNORMAL HIGH (ref 65–99)
Glucose-Capillary: 344 mg/dL — ABNORMAL HIGH (ref 65–99)
Glucose-Capillary: 348 mg/dL — ABNORMAL HIGH (ref 65–99)
Glucose-Capillary: 375 mg/dL — ABNORMAL HIGH (ref 65–99)

## 2016-10-03 LAB — BASIC METABOLIC PANEL
Anion gap: 8 (ref 5–15)
BUN: 61 mg/dL — ABNORMAL HIGH (ref 6–20)
CHLORIDE: 119 mmol/L — AB (ref 101–111)
CO2: 11 mmol/L — ABNORMAL LOW (ref 22–32)
CREATININE: 1.02 mg/dL (ref 0.61–1.24)
Calcium: 8.3 mg/dL — ABNORMAL LOW (ref 8.9–10.3)
GFR calc Af Amer: >60 mL/min (ref >60)
Glucose, Bld: 422 mg/dL — ABNORMAL HIGH (ref 65–99)
POTASSIUM: 3.9 mmol/L (ref 3.5–5.1)
SODIUM: 138 mmol/L (ref 135–145)

## 2016-10-03 LAB — CBC
HCT: 27.7 % — ABNORMAL LOW (ref 39.0–52.0)
HEMOGLOBIN: 8.9 g/dL — AB (ref 13.0–17.0)
MCH: 30.2 pg (ref 26.0–34.0)
MCHC: 32.1 g/dL (ref 30.0–36.0)
MCV: 93.9 fL (ref 78.0–100.0)
Platelets: 14 10*3/uL — CL (ref 150–400)
RBC: 2.95 MIL/uL — AB (ref 4.22–5.81)
RDW: 21.7 % — ABNORMAL HIGH (ref 11.5–15.5)
WBC: 27.5 10*3/uL — ABNORMAL HIGH (ref 4.0–10.5)

## 2016-10-03 LAB — HEPATIC FUNCTION PANEL
ALBUMIN: 1.2 g/dL — AB (ref 3.5–5.0)
ALT: 6 U/L — ABNORMAL LOW (ref 17–63)
AST: 9 U/L — AB (ref 15–41)
Alkaline Phosphatase: 74 U/L (ref 38–126)
Bilirubin, Direct: <0.1 mg/dL — ABNORMAL LOW (ref 0.1–0.5)
Total Bilirubin: 0.3 mg/dL (ref 0.3–1.2)
Total Protein: 5.3 g/dL — ABNORMAL LOW (ref 6.5–8.1)

## 2016-10-03 LAB — PROTIME-INR
INR: 2.14
PROTHROMBIN TIME: 24.3 s — AB (ref 11.4–15.2)

## 2016-10-03 LAB — DIC (DISSEMINATED INTRAVASCULAR COAGULATION)PANEL
D-Dimer, Quant: 11.45 ug/mL-FEU — ABNORMAL HIGH (ref 0.00–0.50)
Platelets: 14 10*3/uL — CL (ref 150–400)
Smear Review: NONE SEEN

## 2016-10-03 LAB — APTT: APTT: 104 s — AB (ref 24–36)

## 2016-10-03 LAB — AMMONIA: AMMONIA: 45 umol/L — AB (ref 9–35)

## 2016-10-03 LAB — PHOSPHORUS: PHOSPHORUS: 4.1 mg/dL (ref 2.5–4.6)

## 2016-10-03 LAB — MAGNESIUM: MAGNESIUM: 2 mg/dL (ref 1.7–2.4)

## 2016-10-03 MED ORDER — SODIUM CHLORIDE 0.9 % IV BOLUS (SEPSIS)
1000.0000 mL | Freq: Once | INTRAVENOUS | Status: AC
  Administered 2016-10-04: 1000 mL via INTRAVENOUS

## 2016-10-03 MED ORDER — INSULIN ASPART 100 UNIT/ML ~~LOC~~ SOLN
0.0000 [IU] | SUBCUTANEOUS | Status: DC
  Administered 2016-10-03: 15 [IU] via SUBCUTANEOUS
  Administered 2016-10-03 (×3): 11 [IU] via SUBCUTANEOUS
  Administered 2016-10-04 (×2): 8 [IU] via SUBCUTANEOUS
  Administered 2016-10-04: 5 [IU] via SUBCUTANEOUS
  Administered 2016-10-04: 11 [IU] via SUBCUTANEOUS
  Administered 2016-10-04: 5 [IU] via SUBCUTANEOUS
  Administered 2016-10-05: 8 [IU] via SUBCUTANEOUS
  Administered 2016-10-05: 5 [IU] via SUBCUTANEOUS
  Administered 2016-10-05: 3 [IU] via SUBCUTANEOUS
  Administered 2016-10-05: 5 [IU] via SUBCUTANEOUS

## 2016-10-03 MED ORDER — SODIUM CHLORIDE 0.9 % IV BOLUS (SEPSIS)
1000.0000 mL | Freq: Once | INTRAVENOUS | Status: AC
  Administered 2016-10-03: 1000 mL via INTRAVENOUS

## 2016-10-03 MED ORDER — SODIUM CHLORIDE 0.9 % IV SOLN
INTRAVENOUS | Status: DC
  Administered 2016-10-03 – 2016-10-04 (×3): via INTRAVENOUS

## 2016-10-03 NOTE — Progress Notes (Signed)
Name: Jason [name] MRN: [id number] DOB: [birth year]    ADMISSION DATE:  08/14/2016 CONSULTATION DATE:  11/21  REFERRING MD :  Dr. 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  He has hx of C6 spine injury with quadriplegia, tracheostomy status.  SUBJECTIVE:    Decreased UOP overnight, neo gtt started and pt given 3L bolus. WBC increased to 27.5, platelets 14, aPTT down to 104.  No seizure activity noted.   VITAL SIGNS: BP (!) 91/50   Pulse 80   Temp (!) 93.6 F (34.2 C) (Axillary)   Resp (!) 21   Ht 5\' 8"  (1.727 m)   Wt 134 lb (60.8 kg)   SpO2 97%   BMI 20.37 kg/m   INTAKE/OUTPUT: I/O last 3 completed shifts: In: 10629.6 [I.V.:3208.1; Other:720; [id number]; IV Piggyback:4539] Out: 2090 [Urine:1020; Stool:1070]  PHYSICAL EXAMINATION: General: Chronically ill appearing young male in NAD, trached on vent HEENT: Tracheostomy clean, no drainage, scant bloody oral secretions Cardiovascular:  s1s2 regular rhythm Pulmonary: resps even non labored on vent, few scattered rhonchi  Abdomen: Soft. Normal bowel sounds. Non distended, colostomy, PEG c/d/i Neurological: no response to verbal stimuli, pupils 3 mm =/R  EXT:  RUE>LUE edema, increased anasarca  CMP Latest Ref Rng & Units 10/03/2016 10/02/2016 10/02/2016  Glucose 65 - 99 mg/dL 422(H) 362(H) 42(LL)  BUN 6 - 20 mg/dL 61(H) 59(H) 52(H)  Creatinine 0.61 - 1.24 mg/dL 1.02 1.16 0.90  Sodium 135 - 145 mmol/L 138 140 143  Potassium 3.5 - 5.1 mmol/L 3.9 4.4 2.5(LL)  Chloride 101 - 111 mmol/L 119(H) 122(H) 123(H)  CO2 22 - 32 mmol/L 11(L) 12(L) 10(L)  Calcium 8.9 - 10.3 mg/dL 8.3(L) 8.4(L) 8.9  Total Protein 6.5 - 8.1 g/dL - - -  Total Bilirubin 0.3 - 1.2 mg/dL - - -  Alkaline Phos 38 - 126 U/L - -  -  AST 15 - 41 U/L - - -  ALT 17 - 63 U/L - - -    CBC Latest Ref Rng & Units 10/03/2016 10/02/2016 10/01/2016  WBC 4.0 - 10.5 K/uL 27.5(H) 15.2(H) 19.7(H)  Hemoglobin 13.0 - 17.0 g/dL 8.9(L) 8.4(L) 8.6(L)  Hematocrit 39.0 - 52.0 % 27.7(L) 26.2(L) 26.4(L)  Platelets 150 - 400 K/uL 14(LL) 11(LL) 15(LL)    Imaging: Dg Chest Port 1 View  Result Date: 10/03/2016 CLINICAL DATA:  30 year old Quadriplegic male. Chronic decubitus ulcers with pelvic osteomyelitis. Altered mental status with seizures. Acute respiratory failure. Initial encounter. EXAM: PORTABLE CHEST 1 VIEW COMPARISON:  09/29/2016 FINDINGS: Portable AP semi upright view at 0539 hours. Stable tracheostomy tube. Stable left upper extremity PICC line. Stable mediastinal contours. Right lower lobe consolidation and/or collapse continues. Veiling bilateral pleural effusions are more apparent, including thickening in the right minor fissure which is probably pleural fluid related. Associated decreased left lung base ventilation. No pneumothorax. No acute pulmonary edema suspected. IMPRESSION: 1.  Stable lines and tubes. 2. Evidence of increased bilateral pleural effusions since 09/29/2016. Overall pleural fluid volume suspected to be small, greater on the right. 3. Otherwise stable right lower lobe consolidation. Electronically Signed   By: Genevie Ann M.D.   On: 10/03/2016 07:26      SIGNIFICANT EVENTS: 11/22 - surgical bx sacral bone/wound 12/04 - transfer to ICU with status epilepticus 12/07 -  eeg remains c poor neurostatus 12/17 - No seizures overnight, versed drip at 30mg /hr, intermittent hypothermia, decreased PEG tube drainage.  Versed d/c'd 12/18 - cervical puncture >> 12/25 - seizures, loaded with versed, IVIG per neuro 12/27 - worsening generalized edema, 3L overnight for decreased UOP, on neo, sr cr doubled, WBC up   LINES: PICC LUE 12/15 >>  Trach (chronic) >>  STUDIES:  EEG 12/06 >> burst suppression MRI brain 12/09 >>  acute infarct Lt paracentral mid pons 3 mm, chronic Rt cerebellar infarct, atrophy MRI Brain 12/22 >> persistant punctate diffusion abnormality at previously noted small left paramedian pontine infarct, small chronic right cerebellar infarcts, stable.  MICROBIOLOGY:  Blood Ctx x3 11/19:  3/3 Streptococcus constellatus  Urine Ctx 11/19:  Acinetobacter & Enterococcus faecalis MRSA PCR 11/20:  Positive Sacral Bone Ctx 11/22:  ESBL Kleb pna, Enterococcus faecalis, strep constellatus Blood Ctx x2 11/23:  Negative  Tracheal Asp Ctx 11/23:  Klebsiella pneumoniae & Acinetobacter Tracheal Asp Ctx 11/24:  Klebsiella pneumoniae & Acinetobacter Tracheal Asp Ctx 11/30:  Pseudomonas aeruginosa & Stenotrophomonas  Urine Culture 12/22 >> ESBL klebsiella >> S- imipenem  ANTIBIOTICS:  Tressie Ellis 12/24 >> 12/27  ASSESSMENT / PLAN:  Acute encephalopathy Status epilepticus >> possibly related to Abx vs meningitis  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 CSF negative for infection (cervical puncture) C6 spine injury with quadriplegia. - AEDs per neurology > keppra, depakote, topamax - Versed load 12/25 for recurrent seizures, continue versed gtt - Trying IVIgG again per Neuro, planned 5 days.  D4/5.   - Solumedrol 1 gm daily 4/5 - Serial neuro exams  Acute on chronic respiratory failure. Small Bilateral Pleural Effusions - suspect in setting of protein calorie malnutrition Tracheostomy status. - PRVC 8cc/kg - SBT when mental status permits - Defer trach change for now. - F/u CXR PRN.    HCAP, recurrent UTI, sacral osteomyelitis, sacral/heel wounds. Pseudomonas in sputum is sens to ceftaz only & klebs in urine is ESBL org Leucocytosis ESBL Klebsiella UTI - defer abx to ID, appreciate input - WOC for dressing recommendations - Trend CBC/ fever curve - repeat UA & urine culture 12/27   Non-anion gap Metabolic acidosis in setting of hyperchloremia  Hypomagenesemia  Hypokalemia/ hypophos AKI - sr cr  doubled over last week (12/27) - Replace electrolytes as indicated - Monitor BMP / UOP  - Continue free water 250 q6 - NS @ 118ml/hr  - neo to ensure adequate renal perfusion - consider albumin / lasix   Anemia of critical illness Thrombocytopenia - changed to argatroban 12/23 with thrombocytopenia  - Trend CBC  - Transfuse per ICU protocol for hgb <7  - d/c argatroban, consider restart heparin when platelets improve   Rt subclavian/brachial DVT 2nd to PICC line 11/27. 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 Chronic diastolic CHF. Hypotension 2nd to sedation. Thrombocytopenia on heparin - HIT ruled out - ct Midodrine - HIT panel negative, d/c argatroban orders.  Consider restart heparin gtt once platelets improve - monitor for bleeding    Severe protein calorie malnutrition. DM gastroparesis. PEG Malfunction - IR called 12/24 (advanced, 12/25)  - continue TF for nutrition, increase rate, monitor for leak - protonix for SUP   DM type 1. Hypoglycemic episodes 12/22, 12/25 - SSI, adjust to moderate scale - D/C lantus with hypoglycemia events >> has had multiple hypoglycemic events due to TF's being on/off with PEG leaking.     FAMILY:  Father Timmothy Sours) updated at bedside 12/26. Mother [employer]) called for discussion.  They continue to have hope that he will wake up from this event.  They have seen him do this "5 times" in the past.  He has been intermittently on/off deep sedation in the setting of seizures.  Family feels that he has not quite ever stabilized from a neuro standpoint to allow appropriate time for him to wake up.  They report they want to given him time after seizure control to allow for him to wake.  If after that time, he is not waking, they would consider "other routes" but not until then.  Continue full support for now.   No family at bedside 12/27 am.   CC Time: 64 minutes  Noe Gens, NP-C [place]  Pulmonary & Critical Care Pgr: [phone] or if no answer [phone] 10/03/2016, 8:37 AM  Attending Note:  30 year old male with extensive PMH who remains unresponsive, septic with elevated WBC, low platelets and INR>3.  On exam, remains completely unresponsive.  I reviewed CXR myself, trach in good position.  Diffuse opacities noted.  Low plat: Sepsis, abx.  Elevated INR: LFTs, INR and RUQ U/S.  Neuro: IVIg and solumedrol day 4/5.  May need to change line if able.  Will need to consider palliation if liver failure is evident.  Kidney function also deteriorating, may not be a dialysis candidate but will call renal in AM if continues to deteriorates.  The patient is critically ill with multiple organ systems failure and requires high complexity decision making for assessment and support, frequent evaluation and titration of therapies, application of advanced monitoring technologies and extensive interpretation of multiple databases.   Critical Care Time devoted to patient care services described in this note is  35  Minutes. This time reflects time of care of this signee Dr Jennet Maduro. This critical care time does not reflect procedure time, or teaching time or supervisory time of PA/NP/Med student/Med Resident etc but could involve care discussion time.  Rush Farmer, M.D. [facility] Pulmonary/Critical Care Medicine. Pager: [phone]. After hours pager: [phone].

## 2016-10-03 NOTE — Progress Notes (Signed)
eLink Physician-Brief Progress Note Patient Name: Jason [name] DOB: 04/14/1986 MRN: [id number]   Date of Service  10/03/2016  HPI/Events of Note  Rn concerned about falling ur op   Recent Labs Lab 09/29/16 0500 09/30/16 0254 10/01/16 0530 10/02/16 0418 10/02/16 2000  CREATININE 0.47* 0.68 0.83 0.90 1.16    Estimated Creatinine Clearance: 64.5 mL/min (by C-G formula based on SCr of 1.16 mg/dL).   eICU Interventions  Doubling of creat - this is aKI stage 2  Plan Keep bp up with neo as before 1 mopre ltier fluid bolus and then saline at 100cc/h (stop d5 due to hyperglycemia and he is on tube beeds(     Intervention Category Major Interventions: Other: Intermediate Interventions: Oliguria - evaluation and management  Michalina Calbert 10/03/2016, 1:04 AM    [facility]IF[facility] AKIN      Serum Creat Urine Output  Risk Stage 1 Increase in Sr Creat x 1.5 Times   or   >/= 0.3mg /dL < 0.5 mL/kg/H x 6h  Injury Stage 2 Increase in Sr Creat x 2 Times < 0.5 ml/kg/H x 12h  Failure Stage 3  Increase in Sr Creat x 3 times   Or  >/= 0.5 mg/dL if Sr Creat > 4mg /dL  Or  RRT Patients   < 0.28mL/kg/H x 24h   Or  Anuria x 12h  Or    Loss - Complete Loss of Renal Function > 4 weeks     End-Stage -ESRD      Note:  RRT patients are deemed to have AKIN STAGE 3 regardless of what their creat or ur op is when they start RRT

## 2016-10-03 NOTE — Progress Notes (Signed)
[place] Progress Note Patient Name: MATTHEO FREEHILL DOB: 1985/10/24 MRN: [id number]   Date of Service  10/03/2016  HPI/Events of Note  Ongoing oliguria  eICU Interventions  Repeat saline bolus      Intervention Category Intermediate Interventions: Oliguria - evaluation and management  Emilia Kayes 10/03/2016, 11:50 PM

## 2016-10-03 NOTE — Progress Notes (Signed)
CCM updated about pt's status and UOP. New orders received, will implement and continue to monitor pt closely.

## 2016-10-03 NOTE — Progress Notes (Signed)
Paged CCM/ Noe Gens NP regarding urine output of 71ml since 0700. Catheter flushed with no results

## 2016-10-03 NOTE — Progress Notes (Signed)
Inpatient Diabetes Program Recommendations  AACE/ADA: New Consensus Statement on Inpatient Glycemic Control (2015)  Target Ranges:  Prepandial:   less than 140 mg/dL      Peak postprandial:   less than 180 mg/dL (1-2 hours)      Critically ill patients:  140 - 180 mg/dL   Lab Results  Component Value Date   GLUCAP 279 (H) 10/03/2016   HGBA1C 6.1 (H) 09/16/2016    Review of Glycemic Control Results for Jason [name], Jason [name] (MRN [id number]) as of 10/03/2016 10:29  Ref. Range 10/02/2016 16:26 10/02/2016 20:16 10/03/2016 00:28  Glucose-Capillary Latest Ref Range: 65 - 99 mg/dL 161 (H) 324 (H) 303 (H)   Inpatient Diabetes Program Recommendations:  Noted increase in tubefeeding and elevated CBGs. Please consider IV insulin drip for glycemic control.  Thank you, Nani Gasser. Sherlock Nancarrow, RN, MSN, CDE Inpatient Glycemic Control Team Team Pager [phone] (8am-5pm) 10/03/2016 10:31 AM

## 2016-10-03 NOTE — Progress Notes (Signed)
INFECTIOUS DISEASE PROGRESS NOTE  ID: Jason [name] is a 30 y.o. 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 ([place])   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([place])   History of pulmonary embolism   DVT (deep venous thrombosis) ([name])   Severe protein-calorie malnutrition ([place])   Dysphagia, pharyngoesophageal phase   Decubitus ulcer of right perineal ischial region, stage 4 ([name])   Decubitus ulcer of left perineal ischial region, stage 4 ([name])   Sacral decubitus ulcer, stage IV ([name])   Multiple allergies   Recurrent UTI   Respiratory failure, chronic ([name])   Aspiration pneumonia of right lower lobe ([name])   Seizures ([name])   Anxiety state   Anemia of chronic disease   Hyponatremia   Encephalopathy   Other acute osteomyelitis, multiple sites ([place])   Lower GI bleed   Viridans streptococci infection   Coma of unknown cause ([name])   Septic shock ([name])   Anaphylactic syndrome   Acute hypoxemic respiratory failure ([name])   Enterococcal infection   Thrombocytopenia ([name])   Status epilepticus ([name])   Chronic respiratory failure with hypoxia ([name])   Acute respiratory acidosis   Hypotension   Pulmonary embolus ([name])   Tracheostomy status ([name])  Subjective: Decreased uop o/n.  On pressors now.   Abtx:  Anti-infectives    Start     Dose/Rate Route Frequency Ordered Stop   09/30/16 1700  cefTAZidime (FORTAZ) 1 g in dextrose 5 % 50 mL IVPB  Status:  Discontinued     1 g 100 mL/hr over 30 Minutes Intravenous Every 8 hours 09/30/16 1613 09/30/16 1618   09/30/16 1700  cefTAZidime (FORTAZ) 1 g in dextrose 5 % 50 mL IVPB  Status:  Discontinued     1 g 100 mL/hr over 30 Minutes Intravenous Every 8 hours 09/30/16 1619 10/02/16 1135   09/11/16 2200  ceFEPIme (MAXIPIME) 2 g in dextrose 5 % 50 mL IVPB  Status:  Discontinued     2 g 100 mL/hr over 30  Minutes Intravenous Every 8 hours 09/11/16 1830 09/11/16 1838   09/11/16 2200  ceFEPIme (MAXIPIME) 2 g in dextrose 5 % 50 mL IVPB  Status:  Discontinued     2 g 100 mL/hr over 30 Minutes Intravenous Every 8 hours 09/11/16 1838 09/15/16 1441   09/11/16 2000  ceFEPIme (MAXIPIME) 2 g in dextrose 5 % 50 mL IVPB  Status:  Discontinued     2 g 100 mL/hr over 30 Minutes Intravenous Every 8 hours 09/11/16 1813 09/11/16 1830   09/11/16 1400  meropenem (MERREM) 1 g in sodium chloride 0.9 % 100 mL IVPB  Status:  Discontinued     1 g 200 mL/hr over 30 Minutes Intravenous Every 8 hours 09/11/16 1150 09/11/16 1813   09/11/16 1400  sulfamethoxazole-trimethoprim (BACTRIM) 240 mg of trimethoprim in dextrose 5 % 250 mL IVPB  Status:  Discontinued     240 mg of trimethoprim 265 mL/hr over 60 Minutes Intravenous Every 8 hours 09/11/16 1156 09/15/16 1441   09/10/16 2100  levofloxacin (LEVAQUIN) IVPB 750 mg  Status:  Discontinued     750 mg 100 mL/hr over 90 Minutes Intravenous Every 24 hours 09/10/16 1849 09/11/16 1115   09/10/16 2000  ceFEPIme (MAXIPIME) 2 g in dextrose 5 % 50 mL IVPB  Status:  Discontinued  2 g 100 mL/hr over 30 Minutes Intravenous Every 8 hours 09/10/16 1847 09/11/16 1140   09/08/16 1700  imipenem-cilastatin (PRIMAXIN) 1,000 mg in sodium chloride 0.9 % 250 mL IVPB  Status:  Discontinued     1,000 mg 250 mL/hr over 60 Minutes Intravenous Every 8 hours 09/08/16 1159 09/10/16 1736   09/01/16 1300  anidulafungin (ERAXIS) 100 mg in sodium chloride 0.9 % 100 mL IVPB  Status:  Discontinued     100 mg over 90 Minutes Intravenous Every 24 hours 08/31/16 1240 09/02/16 1252   09/01/16 1200  vancomycin (VANCOCIN) IVPB 750 mg/150 ml premix  Status:  Discontinued     750 mg 150 mL/hr over 60 Minutes Intravenous Every 24 hours 09/01/16 1145 09/01/16 1712   08/31/16 1700  imipenem-cilastatin (PRIMAXIN) 500 mg in sodium chloride 0.9 % 100 mL IVPB  Status:  Discontinued     500 mg 200 mL/hr over 30  Minutes Intravenous Every 8 hours 08/31/16 1240 09/08/16 1159   08/31/16 1300  anidulafungin (ERAXIS) 200 mg in sodium chloride 0.9 % 200 mL IVPB     200 mg over 180 Minutes Intravenous  Once 08/31/16 1240 08/31/16 1810   08/17/2016 0557  tigecycline (TYGACIL) 50 mg in sodium chloride 0.9 % 100 mL IVPB  Status:  Discontinued     50 mg 200 mL/hr over 30 Minutes Intravenous Every 12 hours 08/28/16 1758 08/31/16 1151   08/28/16 1800  tigecycline (TYGACIL) 100 mg in sodium chloride 0.9 % 100 mL IVPB  Status:  Discontinued     100 mg 200 mL/hr over 30 Minutes Intravenous  Once 08/28/16 1758 08/31/16 1424   08/28/16 1300  linezolid (ZYVOX) IVPB 600 mg  Status:  Discontinued     600 mg 300 mL/hr over 60 Minutes Intravenous Every 12 hours 08/28/16 1239 08/31/16 2014   08/28/16 0200  aztreonam (AZACTAM) 1 g in dextrose 5 % 50 mL IVPB  Status:  Discontinued     1 g 100 mL/hr over 30 Minutes Intravenous Every 8 hours 08/27/16 1802 08/28/16 1232   08/27/16 2000  vancomycin (VANCOCIN) 500 mg in sodium chloride 0.9 % 100 mL IVPB  Status:  Discontinued     500 mg 100 mL/hr over 60 Minutes Intravenous Every 12 hours 08/27/16 1802 08/28/16 1239   08/27/16 1800  aztreonam (AZACTAM) 2 g in dextrose 5 % 50 mL IVPB     2 g 100 mL/hr over 30 Minutes Intravenous  Once 08/27/16 1634 08/27/16 1852   08/27/16 1600  levofloxacin (LEVAQUIN) IVPB 750 mg  Status:  Discontinued     750 mg 100 mL/hr over 90 Minutes Intravenous Every 24 hours 08/21/2016 1324 08/21/2016 1522   08/27/16 0000  vancomycin (VANCOCIN) 500 mg in sodium chloride 0.9 % 100 mL IVPB  Status:  Discontinued     500 mg 100 mL/hr over 60 Minutes Intravenous Every 12 hours 08/19/2016 1324 08/25/2016 1522   08/30/2016 2200  aztreonam (AZACTAM) 1 g in dextrose 5 % 50 mL IVPB  Status:  Discontinued     1 g 100 mL/hr over 30 Minutes Intravenous Every 8 hours 08/17/2016 1324 09/02/2016 1522   09/06/2016 1600  linezolid (ZYVOX) tablet 600 mg  Status:  Discontinued     600  mg Oral Every 12 hours 08/17/2016 1458 08/27/16 1347   09/02/2016 1300  levofloxacin (LEVAQUIN) IVPB 750 mg  Status:  Discontinued     750 mg 100 mL/hr over 90 Minutes Intravenous  Once 08/09/2016 1246 09/06/2016 1522  08/27/2016 1300  aztreonam (AZACTAM) 2 g in dextrose 5 % 50 mL IVPB  Status:  Discontinued     2 g 100 mL/hr over 30 Minutes Intravenous  Once 08/28/2016 1246 08/12/2016 1522   08/17/2016 1300  vancomycin (VANCOCIN) IVPB 1000 mg/200 mL premix  Status:  Discontinued     1,000 mg 200 mL/hr over 60 Minutes Intravenous  Once 08/18/2016 1246 08/12/2016 1522      Medications:  Scheduled: . albuterol  2.5 mg Nebulization QID  . artificial tears   Both Eyes Q8H  . baclofen  10 mg Oral QID  . budesonide (PULMICORT) nebulizer solution  0.5 mg Nebulization BID  . chlorhexidine gluconate (MEDLINE KIT)  15 mL Mouth Rinse BID  . free water  250 mL Per Tube Q6H  . Immune Globulin 10%  20 g Intravenous Once per day on Mon Wed Sat  . insulin aspart  0-15 Units Subcutaneous Q4H  . levETIRAcetam  1,500 mg Intravenous Q12H  . mouth rinse  15 mL Mouth Rinse 10 times per day  . methylPREDNISolone (SOLU-MEDROL) injection  500 mg Intravenous Q12H  . midodrine  20 mg Per Tube TID WC  . oxybutynin  5 mg Per Tube BID  . pantoprazole sodium  40 mg Per Tube Daily  . sodium chloride flush  10-40 mL Intracatheter Q12H  . topiramate  100 mg Oral BID  . valproate sodium  1,000 mg Intravenous Q4H    Objective: Vital signs in last 24 hours: Temp:  [93.6 F (34.2 C)-99.3 F (37.4 C)] 93.6 F (34.2 C) (12/27 0700) Pulse Rate:  [80-102] 80 (12/27 0756) Resp:  [11-30] 21 (12/27 0756) BP: (76-124)/(41-61) 91/50 (12/27 0756) SpO2:  [90 %-100 %] 97 % (12/27 0756) FiO2 (%):  [30 %] 30 % (12/27 0756) Weight:  [60.8 kg (134 lb)] 60.8 kg (134 lb) (12/27 0445)   General appearance: no distress Resp: rhonchi bilaterally Cardio: regular rate and rhythm GI: abnormal findings:  distended and hypoactive bowel  sounds Extremities: edema anasarca and LUE PIC is clean.   Lab Results  Recent Labs  10/02/16 0418 10/02/16 2000 10/03/16 0505  WBC 15.2*  --  27.5*  HGB 8.4*  --  8.9*  HCT 26.2*  --  27.7*  NA 143 140 138  K 2.5* 4.4 3.9  CL 123* 122* 119*  CO2 10* 12* 11*  BUN 52* 59* 61*  CREATININE 0.90 1.16 1.02   Liver Panel  Recent Labs  10/01/16 0530  PROT 6.1*  ALBUMIN 1.5*  AST 11*  ALT 8*  ALKPHOS 71  BILITOT 0.3  BILIDIR <0.1*  IBILI NOT CALCULATED   Sedimentation Rate No results for input(s): ESRSEDRATE in the last 72 hours. C-Reactive Protein No results for input(s): CRP in the last 72 hours.  Microbiology: Recent Results (from the past 240 hour(s))  Fungus Culture With Stain     Status: None (Preliminary result)   Collection Time: 09/21/2016 12:45 PM  Result Value Ref Range Status   Fungus Stain Final report  Final    Comment: (NOTE) Performed At: [facility] [place], [place] [id number] Lindon Romp MD YT:[id number]    Fungus (Mycology) Culture PENDING  Incomplete   Fungal Source CSF  Final  CSF culture with Stat gram stain     Status: None   Collection Time: 09/09/2016 12:45 PM  Result Value Ref Range Status   Specimen Description CSF  Final   Special Requests NONE  Final  Gram Stain   Final    WBC PRESENT,BOTH PMN AND MONONUCLEAR NO ORGANISMS SEEN CYTOSPIN SMEAR    Culture NO GROWTH 3 DAYS  Final   Report Status 09/28/2016 FINAL  Final  Fungus Culture Result     Status: None   Collection Time: 10/02/2016 12:45 PM  Result Value Ref Range Status   Result 1 Comment  Final    Comment: (NOTE) KOH/Calcofluor preparation:  no fungus observed. Performed At: [facility] [place], [place] [id number] Lindon Romp MD UK:[id number]   Culture, Urine     Status: Abnormal   Collection Time: 09/28/16 10:10 AM  Result Value Ref Range Status   Specimen Description URINE, SUPRAPUBIC  Final   Special Requests  Normal  Final   Culture (A)  Final    >=100,000 COLONIES/mL KLEBSIELLA PNEUMONIAE Confirmed Extended Spectrum Beta-Lactamase Producer (ESBL) RESULT CALLED TO, READ BACK BY AND VERIFIED WITH: Agnes Lawrence, NURSE AT [phone] ON [id number] BY Rhea Bleacher RESULT REPEATED AND VERIFIED SINCE SENSITIVITIES WERE MULTI DRUG RESISTANT    Report Status 10/01/2016 FINAL  Final   Organism ID, Bacteria KLEBSIELLA PNEUMONIAE (A)  Final      Susceptibility   Klebsiella pneumoniae - MIC*    AMPICILLIN >=32 RESISTANT Resistant     CEFAZOLIN >=64 RESISTANT Resistant     CEFTRIAXONE >=64 RESISTANT Resistant     CIPROFLOXACIN >=4 RESISTANT Resistant     GENTAMICIN >=16 RESISTANT Resistant     IMIPENEM <=0.25 SENSITIVE Sensitive     NITROFURANTOIN 64 INTERMEDIATE Intermediate     TRIMETH/SULFA >=320 RESISTANT Resistant     AMPICILLIN/SULBACTAM >=32 RESISTANT Resistant     PIP/TAZO >=128 RESISTANT Resistant     Extended ESBL POSITIVE Resistant     * >=100,000 COLONIES/mL KLEBSIELLA PNEUMONIAE    Studies/Results: Dg Chest Port 1 View  Result Date: 10/03/2016 CLINICAL DATA:  30 year old Quadriplegic male. Chronic decubitus ulcers with pelvic osteomyelitis. Altered mental status with seizures. Acute respiratory failure. Initial encounter. EXAM: PORTABLE CHEST 1 VIEW COMPARISON:  09/29/2016 FINDINGS: Portable AP semi upright view at 0539 hours. Stable tracheostomy tube. Stable left upper extremity PICC line. Stable mediastinal contours. Right lower lobe consolidation and/or collapse continues. Veiling bilateral pleural effusions are more apparent, including thickening in the right minor fissure which is probably pleural fluid related. Associated decreased left lung base ventilation. No pneumothorax. No acute pulmonary edema suspected. IMPRESSION: 1.  Stable lines and tubes. 2. Evidence of increased bilateral pleural effusions since 09/29/2016. Overall pleural fluid volume suspected to be small, greater on the right. 3.  Otherwise stable right lower lobe consolidation. Electronically Signed   By: Genevie Ann M.D.   On: 10/03/2016 07:26     Assessment/Plan: C6 Spinal Infarct Quadraplegia Prev Sepsis Streptococcal constellatus bacteremia Sacral Osteomyelitis Bone Bx 11-22 ESBL klebsiella Enterococcus (S- amp) Strep constellatus HCAP, aspiration Chronic Trach CXR worsening vascular congestion.   11-23Acinetobacter, Klebsiella 11-30 stenotrophomonas, pseudomonas Status epiliepticus RUE DVT Infarct of L paracentral pons on MRI ESBL K pneumo in UCx 12-22   For repeat UA and UCx today Would query if his increase in WBC is from IVIg? Ceftaz? Will pull PIC, check abd films CXR "stable"  Continue to with hold anbx If pyuria on UA, would give fosomycin. If furtherl worsening, restart ceftaz   Total days of antibiotics: off 12-10 to 12-24 fortaz 12-24 to 12-26         [name] Infectious Diseases (pager) [phone] [email or web address] 10/03/2016, 10:42 AM  LOS: 38  days

## 2016-10-03 NOTE — Progress Notes (Signed)
[facility] Progress Note Patient Name: Jason [name] DOB: 1986/05/04 MRN: [id number]   Date of Service  10/03/2016  HPI/Events of Note  Oliguria.   eICU Interventions  Will bolus with 0.9 NaCl 1 liter IV over 1 hour now.      Intervention Category Intermediate Interventions: Oliguria - evaluation and management  [name],Steven Eugene 10/03/2016, 6:25 PM

## 2016-10-03 NOTE — Progress Notes (Signed)
CCM updated throughout the shift about pt's hypotension, inadequate UOP, and hyperglycemia. New orders received. Will implement and continue to monitor.

## 2016-10-04 DIAGNOSIS — R680 Hypothermia, not associated with low environmental temperature: Secondary | ICD-10-CM

## 2016-10-04 LAB — CBC
HCT: 24.8 % — ABNORMAL LOW (ref 39.0–52.0)
HEMATOCRIT: 22.2 % — AB (ref 39.0–52.0)
HEMOGLOBIN: 7.1 g/dL — AB (ref 13.0–17.0)
Hemoglobin: 7.9 g/dL — ABNORMAL LOW (ref 13.0–17.0)
MCH: 30 pg (ref 26.0–34.0)
MCH: 29.7 pg (ref 26.0–34.0)
MCHC: 31.9 g/dL (ref 30.0–36.0)
MCHC: 32 g/dL (ref 30.0–36.0)
MCV: 94.3 fL (ref 78.0–100.0)
MCV: 92.9 fL (ref 78.0–100.0)
PLATELETS: 11 10*3/uL — AB (ref 150–400)
Platelets: 15 10*3/uL — CL (ref 150–400)
RBC: 2.63 MIL/uL — AB (ref 4.22–5.81)
RBC: 2.39 MIL/uL — ABNORMAL LOW (ref 4.22–5.81)
RDW: 20.6 % — ABNORMAL HIGH (ref 11.5–15.5)
RDW: 20.2 % — ABNORMAL HIGH (ref 11.5–15.5)
WBC: 18.6 10*3/uL — ABNORMAL HIGH (ref 4.0–10.5)
WBC: 17.9 10*3/uL — ABNORMAL HIGH (ref 4.0–10.5)

## 2016-10-04 LAB — BASIC METABOLIC PANEL
Anion gap: 7 (ref 5–15)
BUN: 65 mg/dL — AB (ref 6–20)
CHLORIDE: 115 mmol/L — AB (ref 101–111)
CO2: 10 mmol/L — ABNORMAL LOW (ref 22–32)
Calcium: 8 mg/dL — ABNORMAL LOW (ref 8.9–10.3)
Creatinine, Ser: 1.12 mg/dL (ref 0.61–1.24)
GFR calc Af Amer: >60 mL/min (ref >60)
GLUCOSE: 332 mg/dL — AB (ref 65–99)
POTASSIUM: 3.6 mmol/L (ref 3.5–5.1)
Sodium: 132 mmol/L — ABNORMAL LOW (ref 135–145)

## 2016-10-04 LAB — GLUCOSE, CAPILLARY
Glucose-Capillary: >600 mg/dL (ref 65–99)
Glucose-Capillary: 293 mg/dL — ABNORMAL HIGH (ref 65–99)
Glucose-Capillary: 228 mg/dL — ABNORMAL HIGH (ref 65–99)
Glucose-Capillary: 295 mg/dL — ABNORMAL HIGH (ref 65–99)
Glucose-Capillary: 276 mg/dL — ABNORMAL HIGH (ref 65–99)

## 2016-10-04 LAB — FUNGUS CULTURE WITH STAIN

## 2016-10-04 LAB — FUNGUS CULTURE RESULT

## 2016-10-04 LAB — POCT I-STAT 3, ART BLOOD GAS (G3+)
Acid-base deficit: 24 mmol/L — ABNORMAL HIGH (ref 0.0–2.0)
Bicarbonate: 9.7 mmol/L — ABNORMAL LOW (ref 20.0–28.0)
O2 Saturation: 58 %
PCO2 ART: 59.7 mmHg — AB (ref 32.0–48.0)
PO2 ART: 55 mmHg — AB (ref 83.0–108.0)
Patient temperature: 98.6
TCO2: 11 mmol/L (ref 0–100)
pH, Arterial: 6.818 — CL (ref 7.350–7.450)

## 2016-10-04 LAB — FUNGAL ORGANISM REFLEX

## 2016-10-04 MED ORDER — STERILE WATER FOR INJECTION IV SOLN
INTRAVENOUS | Status: DC
  Administered 2016-10-04 – 2016-10-05 (×4): via INTRAVENOUS
  Filled 2016-10-04 (×9): qty 850

## 2016-10-04 MED ORDER — SODIUM CHLORIDE 0.9 % IV BOLUS (SEPSIS)
500.0000 mL | Freq: Once | INTRAVENOUS | Status: AC
  Administered 2016-10-04: 500 mL via INTRAVENOUS

## 2016-10-04 MED ORDER — NOREPINEPHRINE BITARTRATE 1 MG/ML IV SOLN
2.0000 ug/min | INTRAVENOUS | Status: DC
  Administered 2016-10-05 (×2): 50 ug/min via INTRAVENOUS
  Administered 2016-10-05: 10 ug/min via INTRAVENOUS
  Filled 2016-10-04 (×3): qty 16

## 2016-10-04 MED ORDER — SODIUM BICARBONATE 8.4 % IV SOLN
INTRAVENOUS | Status: AC
  Filled 2016-10-04: qty 100

## 2016-10-04 MED ORDER — FOLIC ACID 1 MG PO TABS
1.0000 mg | ORAL_TABLET | Freq: Every day | ORAL | Status: DC
  Administered 2016-10-04 – 2016-10-05 (×2): 1 mg via ORAL
  Filled 2016-10-04 (×2): qty 1

## 2016-10-04 MED ORDER — PHENYLEPHRINE HCL 10 MG/ML IJ SOLN
0.0000 ug/min | INTRAVENOUS | Status: DC
  Administered 2016-10-04: 50 ug/min via INTRAVENOUS
  Filled 2016-10-04 (×3): qty 4

## 2016-10-04 MED ORDER — INSULIN GLARGINE 100 UNIT/ML ~~LOC~~ SOLN
5.0000 [IU] | Freq: Every day | SUBCUTANEOUS | Status: DC
  Administered 2016-10-04 – 2016-10-05 (×2): 5 [IU] via SUBCUTANEOUS
  Filled 2016-10-04 (×3): qty 0.05

## 2016-10-04 MED ORDER — SODIUM CHLORIDE 0.9 % IV SOLN
100.0000 mg | Freq: Two times a day (BID) | INTRAVENOUS | Status: DC
  Administered 2016-10-04 – 2016-10-05 (×3): 100 mg via INTRAVENOUS
  Filled 2016-10-04 (×6): qty 10

## 2016-10-04 MED ORDER — PHYTONADIONE 5 MG PO TABS
10.0000 mg | ORAL_TABLET | Freq: Every day | ORAL | Status: AC
  Administered 2016-10-04 – 2016-10-05 (×2): 10 mg via ORAL
  Filled 2016-10-04 (×2): qty 2

## 2016-10-04 MED ORDER — VITAMIN B-12 100 MCG PO TABS
1000.0000 ug | ORAL_TABLET | Freq: Once | ORAL | Status: AC
  Administered 2016-10-04: 1000 ug via ORAL
  Filled 2016-10-04: qty 10

## 2016-10-04 MED ORDER — NOREPINEPHRINE BITARTRATE 1 MG/ML IV SOLN
2.0000 ug/min | INTRAVENOUS | Status: DC
  Administered 2016-10-04: 5 ug/min via INTRAVENOUS
  Administered 2016-10-04: 15 ug/min via INTRAVENOUS
  Filled 2016-10-04 (×2): qty 4

## 2016-10-04 MED ORDER — SODIUM BICARBONATE 8.4 % IV SOLN
200.0000 meq | Freq: Once | INTRAVENOUS | Status: AC
Start: 2016-10-04 — End: 2016-10-04
  Administered 2016-10-04: 200 meq via INTRAVENOUS

## 2016-10-04 NOTE — Progress Notes (Signed)
eLink Physician-Brief Progress Note Patient Name: Jason [name] DOB: 08/05/1986 MRN: [id number]   Date of Service  10/04/2016  HPI/Events of Note  Mixed metabolic and resp acidosis in the setting of worsening renal function.  Serum BUN/creat not indicative of level of renal dysfunction due to low body mass/malnutriction in the setting of quad paresis.  eICU Interventions  Plan: Recheck ABG>>additional bicarb if needed Increase in RR on vent to facilitate correction of pH Check lactate     Intervention Category Major Interventions: Acid-Base disturbance - evaluation and management  DETERDING,ELIZABETH 10/04/2016, 11:55 PM

## 2016-10-04 NOTE — Progress Notes (Signed)
Subjective: Worsening anasarca. No clinical seizure activity reported. On Versed gtt 15 mg/hr.   Objective: Current vital signs: BP 99/65   Pulse 66   Temp (!) 94.2 F (34.6 C) (Rectal) Comment (Src): unable to obtain oral, axillary, rectum is a sealed pocket  Resp (!) 25   Ht 5' 8"  (1.727 m)   Wt 65.8 kg (145 lb)   SpO2 95%   BMI 22.05 kg/m  Vital signs in last 24 hours: Temp:  [94.2 F (34.6 C)-97.6 F (36.4 C)] 94.2 F (34.6 C) (12/28 0800) Pulse Rate:  [54-89] 66 (12/28 1000) Resp:  [6-34] 25 (12/28 1000) BP: (70-110)/(40-88) 99/65 (12/28 1000) SpO2:  [84 %-98 %] 95 % (12/28 1000) FiO2 (%):  [30 %-40 %] 40 % (12/28 0905) Weight:  [65.8 kg (145 lb)] 65.8 kg (145 lb) (12/28 0515)  Intake/Output from previous day: 12/27 0701 - 12/28 0700 In: 7406.3 [I.V.:3485; ZD/GU:4403.4; IV Piggyback:2000] Out: 255 [Urine:105; Stool:150] Intake/Output this shift: Total I/O In: 935 [I.V.:635; NG/GT:300] Out: 300 [Stool:300] Nutritional status: Diet NPO time specified Except for: Sips with Meds  Neurologic Exam: Sedated on Versed. Ment: Sedated exam on background of prior comatose exam off sedation. No volitional responses to any stimuli.  CN: No doll's eye reflex. Pupils unreactive. Weak right corneal reflex, trace left corneal reflex. No neck tone.Eyes no longer tonically deviated downwards. No nystagmus.  Motor/Sensory: Quadriplegic with no movement to stimuli and flaccid tone x 4.   Lab Results: Results for orders placed or performed during the hospital encounter of 08/23/2016 (from the past 48 hour(s))  Glucose, capillary     Status: Abnormal   Collection Time: 10/02/16 12:53 PM  Result Value Ref Range   Glucose-Capillary >600 (HH) 65 - 99 mg/dL  Glucose, capillary     Status: Abnormal   Collection Time: 10/02/16 12:57 PM  Result Value Ref Range   Glucose-Capillary 124 (H) 65 - 99 mg/dL   Comment 1 Venous Specimen   Glucose, capillary     Status: Abnormal   Collection Time:  10/02/16  4:26 PM  Result Value Ref Range   Glucose-Capillary 161 (H) 65 - 99 mg/dL  BMET today at 2000     Status: Abnormal   Collection Time: 10/02/16  8:00 PM  Result Value Ref Range   Sodium 140 135 - 145 mmol/L   Potassium 4.4 3.5 - 5.1 mmol/L    Comment: DELTA CHECK NOTED   Chloride 122 (H) 101 - 111 mmol/L   CO2 12 (L) 22 - 32 mmol/L   Glucose, Bld 362 (H) 65 - 99 mg/dL   BUN 59 (H) 6 - 20 mg/dL   Creatinine, Ser 1.16 0.61 - 1.24 mg/dL   Calcium 8.4 (L) 8.9 - 10.3 mg/dL   GFR calc non Af Amer >60 >60 mL/min   GFR calc Af Amer >60 >60 mL/min    Comment: (NOTE) The eGFR has been calculated using the CKD EPI equation. This calculation has not been validated in all clinical situations. eGFR's persistently <60 mL/min signify possible Chronic Kidney Disease.    Anion gap 6 5 - 15  Magnesium     Status: None   Collection Time: 10/02/16  8:00 PM  Result Value Ref Range   Magnesium 2.0 1.7 - 2.4 mg/dL  Phosphorus     Status: None   Collection Time: 10/02/16  8:00 PM  Result Value Ref Range   Phosphorus 4.4 2.5 - 4.6 mg/dL  Glucose, capillary     Status: Abnormal  Collection Time: 10/02/16  8:16 PM  Result Value Ref Range   Glucose-Capillary 324 (H) 65 - 99 mg/dL   Comment 1 Venous Specimen   Glucose, capillary     Status: Abnormal   Collection Time: 10/03/16 12:28 AM  Result Value Ref Range   Glucose-Capillary 303 (H) 65 - 99 mg/dL  APTT     Status: Abnormal   Collection Time: 10/03/16  5:05 AM  Result Value Ref Range   aPTT 104 (H) 24 - 36 seconds    Comment:        IF BASELINE aPTT IS ELEVATED, SUGGEST PATIENT RISK ASSESSMENT BE USED TO DETERMINE APPROPRIATE ANTICOAGULANT THERAPY.   CBC     Status: Abnormal   Collection Time: 10/03/16  5:05 AM  Result Value Ref Range   WBC 27.5 (H) 4.0 - 10.5 K/uL    Comment: REPEATED TO VERIFY   RBC 2.95 (L) 4.22 - 5.81 MIL/uL   Hemoglobin 8.9 (L) 13.0 - 17.0 g/dL    Comment: CONSISTENT WITH PREVIOUS RESULT   HCT 27.7 (L)  39.0 - 52.0 %   MCV 93.9 78.0 - 100.0 fL   MCH 30.2 26.0 - 34.0 pg   MCHC 32.1 30.0 - 36.0 g/dL   RDW 21.7 (H) 11.5 - 15.5 %   Platelets 14 (LL) 150 - 400 K/uL    Comment: SPECIMEN CHECKED FOR CLOTS REPEATED TO VERIFY PLATELET COUNT CONFIRMED BY SMEAR CRITICAL VALUE NOTED.  VALUE IS CONSISTENT WITH PREVIOUSLY REPORTED AND CALLED VALUE.   BMET in AM     Status: Abnormal   Collection Time: 10/03/16  5:05 AM  Result Value Ref Range   Sodium 138 135 - 145 mmol/L   Potassium 3.9 3.5 - 5.1 mmol/L   Chloride 119 (H) 101 - 111 mmol/L   CO2 11 (L) 22 - 32 mmol/L   Glucose, Bld 422 (H) 65 - 99 mg/dL   BUN 61 (H) 6 - 20 mg/dL   Creatinine, Ser 1.02 0.61 - 1.24 mg/dL   Calcium 8.3 (L) 8.9 - 10.3 mg/dL   GFR calc non Af Amer >60 >60 mL/min   GFR calc Af Amer >60 >60 mL/min    Comment: (NOTE) The eGFR has been calculated using the CKD EPI equation. This calculation has not been validated in all clinical situations. eGFR's persistently <60 mL/min signify possible Chronic Kidney Disease.    Anion gap 8 5 - 15  Phosphorus     Status: None   Collection Time: 10/03/16  5:05 AM  Result Value Ref Range   Phosphorus 4.1 2.5 - 4.6 mg/dL  Magnesium     Status: None   Collection Time: 10/03/16  5:05 AM  Result Value Ref Range   Magnesium 2.0 1.7 - 2.4 mg/dL  Glucose, capillary     Status: Abnormal   Collection Time: 10/03/16  5:12 AM  Result Value Ref Range   Glucose-Capillary 375 (H) 65 - 99 mg/dL   Comment 1 Arterial Specimen   Glucose, capillary     Status: Abnormal   Collection Time: 10/03/16  8:07 AM  Result Value Ref Range   Glucose-Capillary 279 (H) 65 - 99 mg/dL  Urinalysis, Routine w reflex microscopic     Status: Abnormal   Collection Time: 10/03/16 12:00 PM  Result Value Ref Range   Color, Urine YELLOW YELLOW   APPearance CLOUDY (A) CLEAR   Specific Gravity, Urine 1.013 1.005 - 1.030   pH 5.0 5.0 - 8.0   Glucose, UA NEGATIVE  NEGATIVE mg/dL   Hgb urine dipstick LARGE (A)  NEGATIVE   Bilirubin Urine NEGATIVE NEGATIVE   Ketones, ur NEGATIVE NEGATIVE mg/dL   Protein, ur 100 (A) NEGATIVE mg/dL   Nitrite NEGATIVE NEGATIVE   Leukocytes, UA LARGE (A) NEGATIVE   RBC / HPF TOO NUMEROUS TO COUNT 0 - 5 RBC/hpf   WBC, UA TOO NUMEROUS TO COUNT 0 - 5 WBC/hpf   Bacteria, UA MANY (A) NONE SEEN   Squamous Epithelial / LPF NONE SEEN NONE SEEN   WBC Clumps PRESENT    Mucous PRESENT    Budding Yeast PRESENT   Culture, Urine     Status: Abnormal (Preliminary result)   Collection Time: 10/03/16 12:00 PM  Result Value Ref Range   Specimen Description URINE, CLEAN CATCH    Special Requests NONE    Culture >=100,000 COLONIES/mL GRAM NEGATIVE RODS (A)    Report Status PENDING   Glucose, capillary     Status: Abnormal   Collection Time: 10/03/16 12:09 PM  Result Value Ref Range   Glucose-Capillary 361 (H) 65 - 99 mg/dL   Comment 1 Notify RN   DIC (disseminated intravasc coag) panel     Status: Abnormal   Collection Time: 10/03/16  1:31 PM  Result Value Ref Range   Prothrombin Time 26.0 (H) 11.4 - 15.2 seconds   INR 2.33    aPTT 96 (H) 24 - 36 seconds    Comment:        IF BASELINE aPTT IS ELEVATED, SUGGEST PATIENT RISK ASSESSMENT BE USED TO DETERMINE APPROPRIATE ANTICOAGULANT THERAPY.    Fibrinogen 550 (H) 210 - 475 mg/dL   D-Dimer, Quant 11.45 (H) 0.00 - 0.50 ug/mL-FEU    Comment: (NOTE) At the manufacturer cut-off of 0.50 ug/mL FEU, this assay has been documented to exclude PE with a sensitivity and negative predictive value of 97 to 99%.  At this time, this assay has not been approved by the FDA to exclude DVT/VTE. Results should be correlated with clinical presentation.    Platelets 14 (LL) 150 - 400 K/uL    Comment: REPEATED TO VERIFY CRITICAL VALUE NOTED.  VALUE IS CONSISTENT WITH PREVIOUSLY REPORTED AND CALLED VALUE. SPECIMEN CHECKED FOR CLOTS    Smear Review NO SCHISTOCYTES SEEN   Protime-INR     Status: Abnormal   Collection Time: 10/03/16  2:00 PM   Result Value Ref Range   Prothrombin Time 24.3 (H) 11.4 - 15.2 seconds   INR 2.14   Hepatic function panel     Status: Abnormal   Collection Time: 10/03/16  2:00 PM  Result Value Ref Range   Total Protein 5.3 (L) 6.5 - 8.1 g/dL   Albumin 1.2 (L) 3.5 - 5.0 g/dL   AST 9 (L) 15 - 41 U/L   ALT 6 (L) 17 - 63 U/L   Alkaline Phosphatase 74 38 - 126 U/L   Total Bilirubin 0.3 0.3 - 1.2 mg/dL   Bilirubin, Direct <0.1 (L) 0.1 - 0.5 mg/dL   Indirect Bilirubin NOT CALCULATED 0.3 - 0.9 mg/dL  Ammonia     Status: Abnormal   Collection Time: 10/03/16  2:00 PM  Result Value Ref Range   Ammonia 45 (H) 9 - 35 umol/L  Glucose, capillary     Status: Abnormal   Collection Time: 10/03/16  4:49 PM  Result Value Ref Range   Glucose-Capillary 348 (H) 65 - 99 mg/dL  Glucose, capillary     Status: Abnormal   Collection Time: 10/03/16 10:12 PM  Result Value Ref Range   Glucose-Capillary 340 (H) 65 - 99 mg/dL  Glucose, capillary     Status: Abnormal   Collection Time: 10/03/16 11:19 PM  Result Value Ref Range   Glucose-Capillary 344 (H) 65 - 99 mg/dL  CBC     Status: Abnormal   Collection Time: 10/04/16  4:45 AM  Result Value Ref Range   WBC 18.6 (H) 4.0 - 10.5 K/uL   RBC 2.63 (L) 4.22 - 5.81 MIL/uL   Hemoglobin 7.9 (L) 13.0 - 17.0 g/dL   HCT 24.8 (L) 39.0 - 52.0 %   MCV 94.3 78.0 - 100.0 fL   MCH 30.0 26.0 - 34.0 pg   MCHC 31.9 30.0 - 36.0 g/dL   RDW 20.6 (H) 11.5 - 15.5 %   Platelets 11 (LL) 150 - 400 K/uL    Comment: REPEATED TO VERIFY CONSISTENT WITH PREVIOUS RESULT CRITICAL VALUE NOTED.  VALUE IS CONSISTENT WITH PREVIOUSLY REPORTED AND CALLED VALUE.   Basic metabolic panel     Status: Abnormal   Collection Time: 10/04/16  4:45 AM  Result Value Ref Range   Sodium 132 (L) 135 - 145 mmol/L   Potassium 3.6 3.5 - 5.1 mmol/L   Chloride 115 (H) 101 - 111 mmol/L   CO2 10 (L) 22 - 32 mmol/L   Glucose, Bld 332 (H) 65 - 99 mg/dL   BUN 65 (H) 6 - 20 mg/dL   Creatinine, Ser 1.12 0.61 - 1.24 mg/dL    Calcium 8.0 (L) 8.9 - 10.3 mg/dL   GFR calc non Af Amer >60 >60 mL/min   GFR calc Af Amer >60 >60 mL/min    Comment: (NOTE) The eGFR has been calculated using the CKD EPI equation. This calculation has not been validated in all clinical situations. eGFR's persistently <60 mL/min signify possible Chronic Kidney Disease.    Anion gap 7 5 - 15  Glucose, capillary     Status: Abnormal   Collection Time: 10/04/16  4:50 AM  Result Value Ref Range   Glucose-Capillary 293 (H) 65 - 99 mg/dL  Glucose, capillary     Status: Abnormal   Collection Time: 10/04/16  7:56 AM  Result Value Ref Range   Glucose-Capillary 228 (H) 65 - 99 mg/dL   *Note: Due to a large number of results and/or encounters for the requested time period, some results have not been displayed. A complete set of results can be found in Results Review.    Recent Results (from the past 240 hour(s))  Fungus Culture With Stain     Status: None (Preliminary result)   Collection Time: 09/30/2016 12:45 PM  Result Value Ref Range Status   Fungus Stain Final report  Final    Comment: (NOTE) Performed At: [facility] [place], [place] [id number] Lindon Romp MD RA:[id number]    Fungus (Mycology) Culture PENDING  Incomplete   Fungal Source CSF  Final  CSF culture with Stat gram stain     Status: None   Collection Time: 09/14/2016 12:45 PM  Result Value Ref Range Status   Specimen Description CSF  Final   Special Requests NONE  Final   Gram Stain   Final    WBC PRESENT,BOTH PMN AND MONONUCLEAR NO ORGANISMS SEEN CYTOSPIN SMEAR    Culture NO GROWTH 3 DAYS  Final   Report Status 09/28/2016 FINAL  Final  Fungus Culture Result     Status: None   Collection Time: 09/11/2016 12:45 PM  Result Value  Ref Range Status   Result 1 Comment  Final    Comment: (NOTE) KOH/Calcofluor preparation:  no fungus observed. Performed At: [facility] [place], [place] [id number] Lindon Romp MD  IR:[id number]   Culture, Urine     Status: Abnormal   Collection Time: 09/28/16 10:10 AM  Result Value Ref Range Status   Specimen Description URINE, SUPRAPUBIC  Final   Special Requests Normal  Final   Culture (A)  Final    >=100,000 COLONIES/mL KLEBSIELLA PNEUMONIAE Confirmed Extended Spectrum Beta-Lactamase Producer (ESBL) RESULT CALLED TO, READ BACK BY AND VERIFIED WITH: Agnes Lawrence, NURSE AT [phone] ON [id number] BY Rhea Bleacher RESULT REPEATED AND VERIFIED SINCE SENSITIVITIES WERE MULTI DRUG RESISTANT    Report Status 10/01/2016 FINAL  Final   Organism ID, Bacteria KLEBSIELLA PNEUMONIAE (A)  Final      Susceptibility   Klebsiella pneumoniae - MIC*    AMPICILLIN >=32 RESISTANT Resistant     CEFAZOLIN >=64 RESISTANT Resistant     CEFTRIAXONE >=64 RESISTANT Resistant     CIPROFLOXACIN >=4 RESISTANT Resistant     GENTAMICIN >=16 RESISTANT Resistant     IMIPENEM <=0.25 SENSITIVE Sensitive     NITROFURANTOIN 64 INTERMEDIATE Intermediate     TRIMETH/SULFA >=320 RESISTANT Resistant     AMPICILLIN/SULBACTAM >=32 RESISTANT Resistant     PIP/TAZO >=128 RESISTANT Resistant     Extended ESBL POSITIVE Resistant     * >=100,000 COLONIES/mL KLEBSIELLA PNEUMONIAE  Culture, Urine     Status: Abnormal (Preliminary result)   Collection Time: 10/03/16 12:00 PM  Result Value Ref Range Status   Specimen Description URINE, CLEAN CATCH  Final   Special Requests NONE  Final   Culture >=100,000 COLONIES/mL GRAM NEGATIVE RODS (A)  Final   Report Status PENDING  Incomplete    Lipid Panel No results for input(s): CHOL, TRIG, HDL, CHOLHDL, VLDL, LDLCALC in the last 72 hours.  Studies/Results: Dg Abd 1 View  Result Date: 10/03/2016 CLINICAL DATA:  Possibly ileus. EXAM: ABDOMEN - 1 VIEW COMPARISON:  12/16/ 2017. FINDINGS: Soft tissue structures are unremarkable. No bowel distention. NG tube noted good anatomic position. IVC filter noted with its tip at the L2. L2 compression fracture again noted. Chronic  destructive changes of the left ischium noted. Reference is made to recent CT report for discussion of osteomyelitis present. IMPRESSION: 1.  Gastrostomy tube in good anatomic position. 2. IVC filter noted in stable position. 3. L2 compression fracture with possible destructive changes again noted. Reference is made to CT report for further discussion of other bony findings present. 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 Chronic decubitus ulcers with pelvic osteomyelitis. Altered mental status with seizures. Acute respiratory failure. Initial encounter. EXAM: PORTABLE CHEST 1 VIEW COMPARISON:  09/29/2016 FINDINGS: Portable AP semi upright view at 0539 hours. Stable tracheostomy tube. Stable left upper extremity PICC line. Stable mediastinal contours. Right lower lobe consolidation and/or collapse continues. Veiling bilateral pleural effusions are more apparent, including thickening in the right minor fissure which is probably pleural fluid related. Associated decreased left lung base ventilation. No pneumothorax. No acute pulmonary edema suspected. IMPRESSION: 1.  Stable lines and tubes. 2. Evidence of increased bilateral pleural effusions since 09/29/2016. Overall pleural fluid volume suspected to be small, greater on the right. 3. Otherwise stable right lower lobe consolidation. Electronically Signed   By: Lemmie Evens  [place] Crane M.D.   On: 10/03/2016 07:26   US Abdomen Limited Ruq  Result Date: 10/03/2016 CLINICAL DATA:  Liver failure. History of diabetes, colostomy, osteomyelitis. EXAM: US ABDOMEN LIMITED - RIGHT UPPER QUADRANT COMPARISON:  CT abdomen and pelvis September 11, 2016 and abdominal ultrasound August 31, 2016 FINDINGS: Gallbladder: Layering sludge and tiny echogenic gallstones. Gallbladder wall thickening at 5 mm. No pericholecystic fluid. No sonographic Murphy's sign elicited. Common  bile duct: Diameter: 5 mm. Liver: No focal lesion identified. Within normal limits in parenchymal echogenicity. Hepatopetal portal vein. Moderate amount of ascites.  Ascites. IMPRESSION: Gallbladder sludge and tiny stones. Mild wall thickening likely secondary to ascites without sonographic Murphy sign. Ascites. Electronically Signed   By: Elon Alas M.D.   On: 10/03/2016 22:18    Medications:  Scheduled: . albuterol  2.5 mg Nebulization QID  . artificial tears   Both Eyes Q8H  . baclofen  10 mg Oral QID  . budesonide (PULMICORT) nebulizer solution  0.5 mg Nebulization BID  . chlorhexidine gluconate (MEDLINE KIT)  15 mL Mouth Rinse BID  . free water  250 mL Per Tube Q6H  . insulin aspart  0-15 Units Subcutaneous Q4H  . insulin glargine  5 Units Subcutaneous Daily  . levETIRAcetam  1,500 mg Intravenous Q12H  . mouth rinse  15 mL Mouth Rinse 10 times per day  . methylPREDNISolone (SOLU-MEDROL) injection  500 mg Intravenous Q12H  . midodrine  20 mg Per Tube TID WC  . oxybutynin  5 mg Per Tube BID  . pantoprazole sodium  40 mg Per Tube Daily  . sodium chloride  500 mL Intravenous Once  . sodium chloride flush  10-40 mL Intracatheter Q12H  . topiramate  100 mg Oral BID  . 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 Comatose state likely secondary to diffuse cortical damage. 1. Although not described on the official Radiology report, on my review of the images there is diffuse cortical signal abnormality most consistent with vasogenic edema on the T2-weighted and FLAIR pulse sequences of the MRI from 12/21, which would explain essential lack of cortical function on neurological examinations while off sedation.  DDx for underlying etiology includes toxic/metabolic insults as well as diffuse neuronal damage from generalized subclinical seizure activity. Seizures were initially difficult  to control, requiring 3 scheduled anticonvulsants as well as sedation. He had recurrence of clinical seizure activity involving the face earlier this week after discontinuation of Versed, requiring re-initiation of Versed drip. Regarding possible toxic etiology, seizures were initially thought to be antibiotic related. He was on antibiotics which lower seizure threshold (imipenem) and initial seizures may have been precipitated by such.  2. Additional possibility is neurotoxicity secondary to beta-lactam antibiotics, as his mother reports multiple episodes of decreased mental status, including obtundation, with beta-lactam antibiotics in the past. However, he has been off of all antibiotics for about 2.5 weeks and has not improved. 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 An autoimmune encephalopathy that flares with infections is a consideration. It was felt that a trial of steroids/IVIG would be reasonable given his persistent dismal exam; however neither pulsed dose steroids nor IVIG has resulted in improvement (he is on day 5 of both).  3. Also on DDx for etiology is diffuse cortical damage secondary to refractory status epilepticus. 4.  Cervical puncture showed  no evidence of infection. Elevated protein of unclear significance as this can be seen in many disorders, including status epilepticus.  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 Anti-platelet therapy has been held due to thrombocytopenia.  6. Repeat continuous EEG 12/19-12/20 without evidence of ongiong seizure activity.  7. Temperature has been fluctuating over the last 24 hours between 93.6 and 97.6. This is felt most likely to be due to hypothalamic damage, suggesting that the deep grey matter structures are also likely affected by the underlying process that resulted in the cortical  edema seen on MRI. Significant diffuse brainstem damage is most likely present as well, based upon abnormal CN findings including recent persistent downward deviation of the globes on oculomotor exam (tonic deviation now absent while back on Versed). 8. Neurological prognosis is felt to be poor. There is almost no chance for a meaningful neurological recovery in my opinion.  9. Could monitor, with mother's consent, over an additional 7-14 days to determine if there has been a response to IVIG. This will need to be done while off of Versed, although that may be difficult given recurrence of seizure activity earlier this week after Versed was discontinued. Prior to next attempt at discontinuation of Versed, lacosamide will be started.    Recommendations: 1. Continue Depakote 1gm Q6H 2. Continue Keppra 1556m BID 3. Continue Topamax 1073mBID 4. IVIG 2g/kg iv over 5 days, completed yesterday (12/27).  5. Solumedrol 500 mg BID x 10 doses total. Last dose completed today (12/28). 6. Continuing to hold ASA due to thrombocytopenia. Platelets 11K today (12/28). 7. Start Vimpat 100 mg IV BID.  8. Stop Versed tomorrow and reexamine.    LOS: 39 days   @Electronically  signed: Dr. ErKerney Elbe2/28/2017  11:17 AM

## 2016-10-04 NOTE — Progress Notes (Signed)
RT had to increase the Pt's FIO2 to 50% due to decreased SATS. Pt's VT were to low to wean. RT will continue to monitor

## 2016-10-04 NOTE — Progress Notes (Signed)
eLink Physician-Brief Progress Note Patient Name: Jason [name] DOB: June 18, 1986 MRN: [id number]   Date of Service  10/04/2016  HPI/Events of Note  Hypotension - BP = 69/46 with MAP = 55 on Phenylephrine IV infusion at 200 mcg/min. CVP = 20.  eICU Interventions  Will order: 1. ABG STAT. 2. CBC now.  3. Continue to titrate Phenylephrine as ordered.      Intervention Category Major Interventions: Hypotension - evaluation and management  [name],Steven Eugene 10/04/2016, 4:42 PM

## 2016-10-04 NOTE — Progress Notes (Signed)
Pt blood pressure dropped to 0000000 systolic, able to palpate carotid pulse only.  Neo increased to 200 with change in BP to 77/51.  Elink MD notified, orders received.

## 2016-10-04 NOTE — Progress Notes (Signed)
INFECTIOUS DISEASE PROGRESS NOTE  ID: Jason [name] is a 30 y.o. 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 ([place])   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([place])   History of pulmonary embolism   DVT (deep venous thrombosis) ([name])   Severe protein-calorie malnutrition ([place])   Dysphagia, pharyngoesophageal phase   Decubitus ulcer of right perineal ischial region, stage 4 ([place])   Decubitus ulcer of left perineal ischial region, stage 4 ([place])   Sacral decubitus ulcer, stage IV ([name])   Multiple allergies   Recurrent UTI   Respiratory failure, chronic ([place])   Aspiration pneumonia of right lower lobe ([name])   Seizures ([name])   Anxiety state   Anemia of chronic disease   Hyponatremia   Encephalopathy   Other acute osteomyelitis, multiple sites ([place])   Lower GI bleed   Viridans streptococci infection   Coma of unknown cause ([place])   Septic shock ([name])   Anaphylactic syndrome   Acute hypoxemic respiratory failure ([name])   Enterococcal infection   Thrombocytopenia ([name])   Status epilepticus ([name])   Chronic respiratory failure with hypoxia ([name])   Acute respiratory acidosis   Hypotension   Pulmonary embolus ([place])   Tracheostomy status ([place])   Acute renal failure ([place])   Acute liver failure without hepatic coma  Subjective: On vent oliguric  Abtx:  Anti-infectives    Start     Dose/Rate Route Frequency Ordered Stop   09/30/16 1700  cefTAZidime (FORTAZ) 1 g in dextrose 5 % 50 mL IVPB  Status:  Discontinued     1 g 100 mL/hr over 30 Minutes Intravenous Every 8 hours 09/30/16 1613 09/30/16 1618   09/30/16 1700  cefTAZidime (FORTAZ) 1 g in dextrose 5 % 50 mL IVPB  Status:  Discontinued     1 g 100 mL/hr over 30 Minutes Intravenous Every 8 hours 09/30/16 1619 10/02/16 1135   09/11/16 2200  ceFEPIme (MAXIPIME) 2 g in dextrose 5 % 50 mL IVPB   Status:  Discontinued     2 g 100 mL/hr over 30 Minutes Intravenous Every 8 hours 09/11/16 1830 09/11/16 1838   09/11/16 2200  ceFEPIme (MAXIPIME) 2 g in dextrose 5 % 50 mL IVPB  Status:  Discontinued     2 g 100 mL/hr over 30 Minutes Intravenous Every 8 hours 09/11/16 1838 09/15/16 1441   09/11/16 2000  ceFEPIme (MAXIPIME) 2 g in dextrose 5 % 50 mL IVPB  Status:  Discontinued     2 g 100 mL/hr over 30 Minutes Intravenous Every 8 hours 09/11/16 1813 09/11/16 1830   09/11/16 1400  meropenem (MERREM) 1 g in sodium chloride 0.9 % 100 mL IVPB  Status:  Discontinued     1 g 200 mL/hr over 30 Minutes Intravenous Every 8 hours 09/11/16 1150 09/11/16 1813   09/11/16 1400  sulfamethoxazole-trimethoprim (BACTRIM) 240 mg of trimethoprim in dextrose 5 % 250 mL IVPB  Status:  Discontinued     240 mg of trimethoprim 265 mL/hr over 60 Minutes Intravenous Every 8 hours 09/11/16 1156 09/15/16 1441   09/10/16 2100  levofloxacin (LEVAQUIN) IVPB 750 mg  Status:  Discontinued     750 mg 100 mL/hr over 90 Minutes Intravenous Every 24 hours 09/10/16 1849 09/11/16 1115   09/10/16 2000  ceFEPIme (MAXIPIME) 2 g in dextrose 5 % 50 mL  IVPB  Status:  Discontinued     2 g 100 mL/hr over 30 Minutes Intravenous Every 8 hours 09/10/16 1847 09/11/16 1140   09/08/16 1700  imipenem-cilastatin (PRIMAXIN) 1,000 mg in sodium chloride 0.9 % 250 mL IVPB  Status:  Discontinued     1,000 mg 250 mL/hr over 60 Minutes Intravenous Every 8 hours 09/08/16 1159 09/10/16 1736   09/01/16 1300  anidulafungin (ERAXIS) 100 mg in sodium chloride 0.9 % 100 mL IVPB  Status:  Discontinued     100 mg over 90 Minutes Intravenous Every 24 hours 08/31/16 1240 09/02/16 1252   09/01/16 1200  vancomycin (VANCOCIN) IVPB 750 mg/150 ml premix  Status:  Discontinued     750 mg 150 mL/hr over 60 Minutes Intravenous Every 24 hours 09/01/16 1145 09/01/16 1712   08/31/16 1700  imipenem-cilastatin (PRIMAXIN) 500 mg in sodium chloride 0.9 % 100 mL IVPB   Status:  Discontinued     500 mg 200 mL/hr over 30 Minutes Intravenous Every 8 hours 08/31/16 1240 09/08/16 1159   08/31/16 1300  anidulafungin (ERAXIS) 200 mg in sodium chloride 0.9 % 200 mL IVPB     200 mg over 180 Minutes Intravenous  Once 08/31/16 1240 08/31/16 1810   09/04/2016 0557  tigecycline (TYGACIL) 50 mg in sodium chloride 0.9 % 100 mL IVPB  Status:  Discontinued     50 mg 200 mL/hr over 30 Minutes Intravenous Every 12 hours 08/28/16 1758 08/31/16 1151   08/28/16 1800  tigecycline (TYGACIL) 100 mg in sodium chloride 0.9 % 100 mL IVPB  Status:  Discontinued     100 mg 200 mL/hr over 30 Minutes Intravenous  Once 08/28/16 1758 08/31/16 1424   08/28/16 1300  linezolid (ZYVOX) IVPB 600 mg  Status:  Discontinued     600 mg 300 mL/hr over 60 Minutes Intravenous Every 12 hours 08/28/16 1239 08/31/16 2014   08/28/16 0200  aztreonam (AZACTAM) 1 g in dextrose 5 % 50 mL IVPB  Status:  Discontinued     1 g 100 mL/hr over 30 Minutes Intravenous Every 8 hours 08/27/16 1802 08/28/16 1232   08/27/16 2000  vancomycin (VANCOCIN) 500 mg in sodium chloride 0.9 % 100 mL IVPB  Status:  Discontinued     500 mg 100 mL/hr over 60 Minutes Intravenous Every 12 hours 08/27/16 1802 08/28/16 1239   08/27/16 1800  aztreonam (AZACTAM) 2 g in dextrose 5 % 50 mL IVPB     2 g 100 mL/hr over 30 Minutes Intravenous  Once 08/27/16 1634 08/27/16 1852   08/27/16 1600  levofloxacin (LEVAQUIN) IVPB 750 mg  Status:  Discontinued     750 mg 100 mL/hr over 90 Minutes Intravenous Every 24 hours 09/06/2016 1324 08/28/2016 1522   08/27/16 0000  vancomycin (VANCOCIN) 500 mg in sodium chloride 0.9 % 100 mL IVPB  Status:  Discontinued     500 mg 100 mL/hr over 60 Minutes Intravenous Every 12 hours 08/25/2016 1324 09/06/2016 1522   08/30/2016 2200  aztreonam (AZACTAM) 1 g in dextrose 5 % 50 mL IVPB  Status:  Discontinued     1 g 100 mL/hr over 30 Minutes Intravenous Every 8 hours 08/10/2016 1324 09/01/2016 1522   09/03/2016 1600  linezolid  (ZYVOX) tablet 600 mg  Status:  Discontinued     600 mg Oral Every 12 hours 08/23/2016 1458 08/27/16 1347   08/27/2016 1300  levofloxacin (LEVAQUIN) IVPB 750 mg  Status:  Discontinued     750 mg 100 mL/hr over  90 Minutes Intravenous  Once 08/12/2016 1246 08/25/2016 1522   08/17/2016 1300  aztreonam (AZACTAM) 2 g in dextrose 5 % 50 mL IVPB  Status:  Discontinued     2 g 100 mL/hr over 30 Minutes Intravenous  Once 08/23/2016 1246 08/31/2016 1522   08/20/2016 1300  vancomycin (VANCOCIN) IVPB 1000 mg/200 mL premix  Status:  Discontinued     1,000 mg 200 mL/hr over 60 Minutes Intravenous  Once 08/28/2016 1246 08/24/2016 1522      Medications:  Scheduled: . albuterol  2.5 mg Nebulization QID  . artificial tears   Both Eyes Q8H  . baclofen  10 mg Oral QID  . budesonide (PULMICORT) nebulizer solution  0.5 mg Nebulization BID  . chlorhexidine gluconate (MEDLINE KIT)  15 mL Mouth Rinse BID  . free water  250 mL Per Tube Q6H  . insulin aspart  0-15 Units Subcutaneous Q4H  . insulin glargine  5 Units Subcutaneous Daily  . levETIRAcetam  1,500 mg Intravenous Q12H  . mouth rinse  15 mL Mouth Rinse 10 times per day  . methylPREDNISolone (SOLU-MEDROL) injection  500 mg Intravenous Q12H  . midodrine  20 mg Per Tube TID WC  . oxybutynin  5 mg Per Tube BID  . pantoprazole sodium  40 mg Per Tube Daily  . sodium chloride  500 mL Intravenous Once  . sodium chloride flush  10-40 mL Intracatheter Q12H  . topiramate  100 mg Oral BID  . valproate sodium  1,000 mg Intravenous Q4H    Objective: Vital signs in last 24 hours: Temp:  [94.2 F (34.6 C)-97.6 F (36.4 C)] 94.2 F (34.6 C) (12/28 0800) Pulse Rate:  [54-89] 66 (12/28 1000) Resp:  [6-34] 25 (12/28 1000) BP: (70-110)/(40-88) 99/65 (12/28 1000) SpO2:  [84 %-98 %] 95 % (12/28 1000) FiO2 (%):  [30 %-40 %] 40 % (12/28 0905) Weight:  [65.8 kg (145 lb)] 65.8 kg (145 lb) (12/28 0515)   General appearance: no distress Resp: rhonchi anterior - bilateral Cardio:  regular rate and rhythm GI: abnormal findings:  hypoactive bowel sounds Extremities: edema anasarca  Lab Results  Recent Labs  10/03/16 0505 10/04/16 0445  WBC 27.5* 18.6*  HGB 8.9* 7.9*  HCT 27.7* 24.8*  NA 138 132*  K 3.9 3.6  CL 119* 115*  CO2 11* 10*  BUN 61* 65*  CREATININE 1.02 1.12   Liver Panel  Recent Labs  10/03/16 1400  PROT 5.3*  ALBUMIN 1.2*  AST 9*  ALT 6*  ALKPHOS 74  BILITOT 0.3  BILIDIR <0.1*  IBILI NOT CALCULATED   Sedimentation Rate No results for input(s): ESRSEDRATE in the last 72 hours. C-Reactive Protein No results for input(s): CRP in the last 72 hours.  Microbiology: Recent Results (from the past 240 hour(s))  Fungus Culture With Stain     Status: None (Preliminary result)   Collection Time: 09/11/2016 12:45 PM  Result Value Ref Range Status   Fungus Stain Final report  Final    Comment: (NOTE) Performed At: [facility] [place], [place] [id number] Lindon Romp MD [id number]    Fungus (Mycology) Culture PENDING  Incomplete   Fungal Source CSF  Final  CSF culture with Stat gram stain     Status: None   Collection Time: 09/22/2016 12:45 PM  Result Value Ref Range Status   Specimen Description CSF  Final   Special Requests NONE  Final   Gram Stain   Final    WBC  PRESENT,BOTH PMN AND MONONUCLEAR NO ORGANISMS SEEN CYTOSPIN SMEAR    Culture NO GROWTH 3 DAYS  Final   Report Status 09/28/2016 FINAL  Final  Fungus Culture Result     Status: None   Collection Time: 09/23/2016 12:45 PM  Result Value Ref Range Status   Result 1 Comment  Final    Comment: (NOTE) KOH/Calcofluor preparation:  no fungus observed. Performed At: [facility] [place], [place] [id number] Lindon Romp MD [id number]   Culture, Urine     Status: Abnormal   Collection Time: 09/28/16 10:10 AM  Result Value Ref Range Status   Specimen Description URINE, SUPRAPUBIC  Final   Special Requests Normal  Final     Culture (A)  Final    >=100,000 COLONIES/mL KLEBSIELLA PNEUMONIAE Confirmed Extended Spectrum Beta-Lactamase Producer (ESBL) RESULT CALLED TO, READ BACK BY AND VERIFIED WITH: Agnes Lawrence, NURSE AT [phone] ON [id number] BY Rhea Bleacher RESULT REPEATED AND VERIFIED SINCE SENSITIVITIES WERE MULTI DRUG RESISTANT    Report Status 10/01/2016 FINAL  Final   Organism ID, Bacteria KLEBSIELLA PNEUMONIAE (A)  Final      Susceptibility   Klebsiella pneumoniae - MIC*    AMPICILLIN >=32 RESISTANT Resistant     CEFAZOLIN >=64 RESISTANT Resistant     CEFTRIAXONE >=64 RESISTANT Resistant     CIPROFLOXACIN >=4 RESISTANT Resistant     GENTAMICIN >=16 RESISTANT Resistant     IMIPENEM <=0.25 SENSITIVE Sensitive     NITROFURANTOIN 64 INTERMEDIATE Intermediate     TRIMETH/SULFA >=320 RESISTANT Resistant     AMPICILLIN/SULBACTAM >=32 RESISTANT Resistant     PIP/TAZO >=128 RESISTANT Resistant     Extended ESBL POSITIVE Resistant     * >=100,000 COLONIES/mL KLEBSIELLA PNEUMONIAE    Studies/Results: Dg Abd 1 View  Result Date: 10/03/2016 CLINICAL DATA:  Possibly ileus. EXAM: ABDOMEN - 1 VIEW COMPARISON:  12/16/ 2017. FINDINGS: Soft tissue structures are unremarkable. No bowel distention. NG tube noted good anatomic position. IVC filter noted with its tip at the L2. L2 compression fracture again noted. Chronic destructive changes of the left ischium noted. Reference is made to recent CT report for discussion of osteomyelitis present. IMPRESSION: 1.  Gastrostomy tube in good anatomic position. 2. IVC filter noted in stable position. 3. L2 compression fracture with possible destructive changes again noted. Reference is made to CT report for further discussion of other bony findings present. 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 Chronic decubitus ulcers with pelvic osteomyelitis. Altered mental  status with seizures. Acute respiratory failure. Initial encounter. EXAM: PORTABLE CHEST 1 VIEW COMPARISON:  09/29/2016 FINDINGS: Portable AP semi upright view at 0539 hours. Stable tracheostomy tube. Stable left upper extremity PICC line. Stable mediastinal contours. Right lower lobe consolidation and/or collapse continues. Veiling bilateral pleural effusions are more apparent, including thickening in the right minor fissure which is probably pleural fluid related. Associated decreased left lung base ventilation. No pneumothorax. No acute pulmonary edema suspected. IMPRESSION: 1.  Stable lines and tubes. 2. Evidence of increased bilateral pleural effusions since 09/29/2016. Overall pleural fluid volume suspected to be small, greater on the right. 3. Otherwise stable right lower lobe consolidation. Electronically Signed   By: Genevie Ann M.D.   On: 10/03/2016 07:26   US Abdomen Limited Ruq  Result Date: 10/03/2016 CLINICAL DATA:  Liver failure. History of diabetes, colostomy, osteomyelitis. EXAM:  US ABDOMEN LIMITED - RIGHT UPPER QUADRANT COMPARISON:  CT abdomen and pelvis September 11, 2016 and abdominal ultrasound August 31, 2016 FINDINGS: Gallbladder: Layering sludge and tiny echogenic gallstones. Gallbladder wall thickening at 5 mm. No pericholecystic fluid. No sonographic Murphy's sign elicited. Common bile duct: Diameter: 5 mm. Liver: No focal lesion identified. Within normal limits in parenchymal echogenicity. Hepatopetal portal vein. Moderate amount of ascites.  Ascites. IMPRESSION: Gallbladder sludge and tiny stones. Mild wall thickening likely secondary to ascites without sonographic Murphy sign. Ascites. Electronically Signed   By: Elon Alas M.D.   On: 10/03/2016 22:18     Assessment/Plan: C6 Spinal Infarct Quadraplegia Prev Sepsis Streptococcal constellatus bacteremia Sacral Osteomyelitis Bone Bx 11-22 ESBL klebsiella Enterococcus (S-  amp) Strep constellatus HCAP, aspiration Chronic Trach CXR worsening vascular congestion.   11-23Acinetobacter, Klebsiella 11-30 stenotrophomonas, pseudomonas Status epiliepticus RUE DVT Infarct of L paracentral pons on MRI ESBL K pneumo in UCx 12-22  WBC slightly better repeat UA unchanged, hypothermic Continue to with hold anbx  Spoke with care team who believe we are nearing medical futility.  I have asked ethics to weigh in on case.    Total days of antibiotics: off 12-10 to 12-24 fortaz 12-24 to 12-26         [name] Infectious Diseases (pager) [phone] [email or web address] 10/04/2016, 10:45 AM  LOS: 39 days

## 2016-10-04 NOTE — Progress Notes (Signed)
Name: Jason [name] MRN: [id number] DOB: 1986-04-15    ADMISSION DATE:  08/08/2016 CONSULTATION DATE:  11/21  REFERRING MD :  Dr. 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  He has hx of C6 spine injury with quadriplegia, tracheostomy status.  SUBJECTIVE:    Decreased UOP overnight. Will give bolus, flush suprapubic cath VITAL SIGNS: BP (!) 83/73   Pulse 75   Temp (!) 94.2 F (34.6 C) (Rectal) Comment (Src): unable to obtain oral, axillary, rectum is a sealed pocket  Resp (!) 24   Ht 5\' 8"  (1.727 m)   Wt 145 lb (65.8 kg)   SpO2 92%   BMI 22.05 kg/m   INTAKE/OUTPUT: I/O last 3 completed shifts: In: 13320.4 [I.V.:4904.2; Other:360; NG/GT:3006.3; IV Piggyback:5050] Out: [id number] [Urine:275; Stool:400]  PHYSICAL EXAMINATION: General: Chronically ill appearing young male in NAD, trached on vent HEENT: Tracheostomy clean, no drainage, scant bloody oral secretions Cardiovascular:  s1s2 regular rhythm Pulmonary: resps even non labored on vent, few scattered rhonchi  Abdomen: Soft. Normal bowel sounds. Non distended, colostomy, PEG c/d/i Neurological: no response to verbal stimuli, pupils 3 mm =/R  EXT:  RUE>LUE edema, increased anasarca  CMP Latest Ref Rng & Units 10/04/2016 10/03/2016 10/02/2016  Glucose 65 - 99 mg/dL 332(H) 422(H) 362(H)  BUN 6 - 20 mg/dL 65(H) 61(H) 59(H)  Creatinine 0.61 - 1.24 mg/dL 1.12 1.02 1.16  Sodium 135 - 145 mmol/L 132(L) 138 140  Potassium 3.5 - 5.1 mmol/L 3.6 3.9 4.4  Chloride 101 - 111 mmol/L 115(H) 119(H) 122(H)  CO2 22 - 32 mmol/L 10(L) 11(L) 12(L)  Calcium 8.9 - 10.3 mg/dL 8.0(L) 8.3(L) 8.4(L)  Total Protein 6.5 - 8.1 g/dL - 5.3(L) -  Total Bilirubin 0.3 - 1.2 mg/dL - 0.3 -  Alkaline Phos 38 - 126 U/L - 74 -  AST  15 - 41 U/L - 9(L) -  ALT 17 - 63 U/L - 6(L) -    CBC Latest Ref Rng & Units 10/04/2016 10/03/2016 10/03/2016  WBC 4.0 - 10.5 K/uL 18.6(H) - 27.5(H)  Hemoglobin 13.0 - 17.0 g/dL 7.9(L) - 8.9(L)  Hematocrit 39.0 - 52.0 % 24.8(L) - 27.7(L)  Platelets 150 - 400 K/uL 11(LL) 14(LL) 14(LL)    Imaging: Dg Abd 1 View  Result Date: 10/03/2016 CLINICAL DATA:  Possibly ileus. EXAM: ABDOMEN - 1 VIEW COMPARISON:  12/16/ 2017. FINDINGS: Soft tissue structures are unremarkable. No bowel distention. NG tube noted good anatomic position. IVC filter noted with its tip at the L2. L2 compression fracture again noted. Chronic destructive changes of the left ischium noted. Reference is made to recent CT report for discussion of osteomyelitis present. IMPRESSION: 1.  Gastrostomy tube in good anatomic position. 2. IVC filter noted in stable position. 3. L2 compression fracture with possible destructive changes again noted. Reference is made to CT report for further discussion of other bony findings present. 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 Chronic decubitus ulcers with pelvic osteomyelitis. Altered mental status with seizures. Acute respiratory failure. Initial encounter. EXAM: PORTABLE CHEST 1 VIEW COMPARISON:  09/29/2016 FINDINGS: Portable AP semi upright view at 0539 hours. Stable tracheostomy tube. Stable left  upper extremity PICC line. Stable mediastinal contours. Right lower lobe consolidation and/or collapse continues. Veiling bilateral pleural effusions are more apparent, including thickening in the right minor fissure which is probably pleural fluid related. Associated decreased left lung base ventilation. No pneumothorax. No acute pulmonary edema suspected. IMPRESSION: 1.  Stable lines and tubes. 2. Evidence of increased bilateral pleural effusions since 09/29/2016. Overall pleural  fluid volume suspected to be small, greater on the right. 3. Otherwise stable right lower lobe consolidation. Electronically Signed   By: Genevie Ann M.D.   On: 10/03/2016 07:26   US Abdomen Limited Ruq  Result Date: 10/03/2016 CLINICAL DATA:  Liver failure. History of diabetes, colostomy, osteomyelitis. EXAM: US ABDOMEN LIMITED - RIGHT UPPER QUADRANT COMPARISON:  CT abdomen and pelvis September 11, 2016 and abdominal ultrasound August 31, 2016 FINDINGS: Gallbladder: Layering sludge and tiny echogenic gallstones. Gallbladder wall thickening at 5 mm. No pericholecystic fluid. No sonographic Murphy's sign elicited. Common bile duct: Diameter: 5 mm. Liver: No focal lesion identified. Within normal limits in parenchymal echogenicity. Hepatopetal portal vein. Moderate amount of ascites.  Ascites. IMPRESSION: Gallbladder sludge and tiny stones. Mild wall thickening likely secondary to ascites without sonographic Murphy sign. Ascites. Electronically Signed   By: Elon Alas M.D.   On: 10/03/2016 22:18      SIGNIFICANT EVENTS: 11/22 - surgical bx sacral bone/wound 12/04 - transfer to ICU with status epilepticus 12/07 - eeg remains c poor neurostatus 12/17 - No seizures overnight, versed drip at 30mg /hr, intermittent hypothermia, decreased PEG tube drainage.  Versed d/c'd 12/18 - cervical puncture >> 12/25 - seizures, loaded with versed, IVIG per neuro 12/27 - worsening generalized edema, 3L overnight for decreased UOP, on neo, sr cr doubled, WBC up  12/28 Ethics committee consult called per ID  LINES: PICC LUE 12/15 >>  Trach (chronic) >>  STUDIES:  EEG 12/06 >> burst suppression MRI brain 12/09 >> acute infarct Lt paracentral mid pons 3 mm, chronic Rt cerebellar infarct, atrophy MRI Brain 12/22 >> persistant punctate diffusion abnormality at previously noted small left paramedian pontine infarct, small chronic right cerebellar infarcts, stable.  MICROBIOLOGY:  Blood Ctx x3 11/19:  3/3  Streptococcus constellatus  Urine Ctx 11/19:  Acinetobacter & Enterococcus faecalis MRSA PCR 11/20:  Positive Sacral Bone Ctx 11/22:  ESBL Kleb pna, Enterococcus faecalis, strep constellatus Blood Ctx x2 11/23:  Negative  Tracheal Asp Ctx 11/23:  Klebsiella pneumoniae & Acinetobacter Tracheal Asp Ctx 11/24:  Klebsiella pneumoniae & Acinetobacter Tracheal Asp Ctx 11/30:  Pseudomonas aeruginosa & Stenotrophomonas  Urine Culture 12/22 >> ESBL klebsiella >> S- imipenem  ANTIBIOTICS:  Tressie Ellis 12/24 >> 12/27  ASSESSMENT / PLAN:  Acute encephalopathy Status epilepticus >> possibly related to Abx vs meningitis  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 CSF negative for infection (cervical puncture) C6 spine injury with quadriplegia. - AEDs per neurology > keppra, depakote, topamax - Versed load 12/25 for recurrent seizures, continue versed gtt - Trying IVIgG again per Neuro, planned 5 days.  D4/5.   - Solumedrol 1 gm daily 4/5 - Serial neuro exams  Acute on chronic respiratory failure. Small Bilateral Pleural Effusions - suspect in setting of protein calorie malnutrition Tracheostomy status. - PRVC 8cc/kg - SBT when mental status permits - Defer trach change for now. - F/u CXR PRN.    HCAP, recurrent UTI, sacral osteomyelitis, sacral/heel wounds. Pseudomonas in sputum is sens to ceftaz only & klebs in urine is ESBL org Leucocytosis ESBL Klebsiella UTI - defer abx to ID, appreciate input -  WOC for dressing recommendations - Trend CBC/ fever curve - repeat UA & urine culture 12/27   Non-anion gap Metabolic acidosis in setting of hyperchloremia  Hypomagenesemia  Hypokalemia/ hypophos AKI - sr cr doubled over last week (12/27) Lab Results  Component Value Date   CREATININE 1.12 10/04/2016   CREATININE 1.02 10/03/2016   CREATININE 1.16 10/02/2016   CREATININE 0.63 06/18/2016   CREATININE 0.78 11/02/2015    - Replace electrolytes as indicated - Monitor BMP / UOP . Oliguria 12/28  despite multiple IV boluses 12/28. Repeat bolus 12/28. Consider lasix May need renal consult 12/28 but doubt he is dialysis candidate and at this point he doesn't need dialysis. - Continue free water 250 q6 - NS @ 138ml/hr  - neo to ensure adequate renal perfusion - consider albumin / lasix- flush supra pubic cath 12/28  Anemia of critical illness Thrombocytopenia - changed to argatroban 12/23 with thrombocytopenia  - Trend CBC  - Transfuse per ICU protocol for hgb <7  - d/c argatroban, consider restart heparin when platelets improve plts 28 12/28   Rt subclavian/brachial DVT 2nd to PICC line 11/27. 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 Chronic diastolic CHF. Hypotension 2nd to sedation. Thrombocytopenia on heparin - HIT ruled out - ct Midodrine - HIT panel negative, d/c argatroban orders.  Consider restart heparin gtt once platelets improve. 12/28 plts 11 - monitor for bleeding    Severe protein calorie malnutrition. DM gastroparesis. PEG Malfunction - IR called 12/24 (advanced, 12/25)  - continue TF for nutrition, increase rate, monitor for leak - protonix for SUP   DM type 1. CBG (last 3)   Recent Labs  10/03/16 2319 10/04/16 0450 10/04/16 0756  GLUCAP 344* 293* 228*     Hypoglycemic episodes 12/22, 12/25 - SSI, adjust to moderate scale - resume low diose lantus 12/28>>notw  has had multiple hypoglycemic events due to TF's being on/off with PEG leaking.     FAMILY:  Father Timmothy Sours) updated at bedside 12/26. Mother [employer]) called for discussion.  They continue to have hope that he will wake up from this event.  They have seen him do this "5 times" in the past.  He has been intermittently on/off deep sedation in the setting of seizures.  Family feels that he has not quite ever stabilized from a neuro standpoint to allow appropriate time for him to wake up.  They report they want to given him time after seizure  control to allow for him to wake.  If after that time, he is not waking, they would consider "other routes" but not until then.  Continue full support for now.   No family at bedside 12/28 am.   CC Time: 30 minutes  [name] Minor ACNP [name] PCCM Pager [phone] till 3 pm If no answer page [phone] 10/04/2016, 10:16 AM  Attending Note:  30 year old male with extensive PMH who remains unresponsive, septic with elevated WBC, low platelets and INR>3.  On exam, remains completely unresponsive.  I reviewed CXR myself, trach in good position.  Diffuse opacities noted.  Low plat: Sepsis, abx.  Elevated INR, LFTs normal, U/S .  Neuro: IVIg and solumedrol day 5/5.  May need to change line if able (unable to due to coagulopathy).  Kidney function also deteriorating, may not be a dialysis candidate.  Spoke with H/O MD, recommendations given over the phone and orders written.  Spoke with Dr. Sharyn Lull  Mathews from Nurse, learning disability.  Will need to organize ethics consult.  The patient is critically ill with multiple organ systems failure and requires high complexity decision making for assessment and support, frequent evaluation and titration of therapies, application of advanced monitoring technologies and extensive interpretation of multiple databases.   Critical Care Time devoted to patient care services described in this note is  60  Minutes. This time reflects time of care of this signee Dr Jennet Maduro. This critical care time does not reflect procedure time, or teaching time or supervisory time of PA/NP/Med student/Med Resident etc but could involve care discussion time.  Rush Farmer, M.D. [facility] Pulmonary/Critical Care Medicine. Pager: [phone]. After hours pager: [phone].

## 2016-10-04 NOTE — Progress Notes (Signed)
[place] Progress Note Patient Name: Jason [name] DOB: 1986/01/10 MRN: [id number]   Date of Service  10/04/2016  HPI/Events of Note  ABG = Can't measure pH/61/37.3/8.1  eICU Interventions  Will order: 1. NaHCO3 200 meq IV now.  2. NaHOC3 IV infusion to run at 125 mL/hour. 3. ABG at 6:30 PM.     Intervention Category Major Interventions: Acid-Base disturbance - evaluation and management  Monica Codd Eugene 10/04/2016, 5:14 PM

## 2016-10-05 ENCOUNTER — Inpatient Hospital Stay (HOSPITAL_COMMUNITY): Payer: Medicaid Other

## 2016-10-05 DIAGNOSIS — Z7189 Other specified counseling: Secondary | ICD-10-CM

## 2016-10-05 LAB — BASIC METABOLIC PANEL
ANION GAP: 11 (ref 5–15)
BUN: 66 mg/dL — ABNORMAL HIGH (ref 6–20)
CHLORIDE: 108 mmol/L (ref 101–111)
CO2: 17 mmol/L — ABNORMAL LOW (ref 22–32)
Calcium: 7.4 mg/dL — ABNORMAL LOW (ref 8.9–10.3)
Creatinine, Ser: 1.21 mg/dL (ref 0.61–1.24)
GFR calc Af Amer: >60 mL/min (ref >60)
GLUCOSE: 206 mg/dL — AB (ref 65–99)
POTASSIUM: 3.1 mmol/L — AB (ref 3.5–5.1)
Sodium: 136 mmol/L (ref 135–145)

## 2016-10-05 LAB — PT FACTOR INHIBITOR (MIXING STUDY)
1 HR INCUB PT 1:1NP: 11.3 s (ref 9.6–11.5)
PT 11NP: 11.6 s — AB (ref 9.6–11.5)
PT: 20.8 s — AB (ref 9.6–11.5)

## 2016-10-05 LAB — CBC
HCT: 19.5 % — ABNORMAL LOW (ref 39.0–52.0)
HEMATOCRIT: 22 % — AB (ref 39.0–52.0)
HEMOGLOBIN: 6.5 g/dL — AB (ref 13.0–17.0)
HEMOGLOBIN: 7.2 g/dL — AB (ref 13.0–17.0)
MCH: 30.4 pg (ref 26.0–34.0)
MCH: 30 pg (ref 26.0–34.0)
MCHC: 33.3 g/dL (ref 30.0–36.0)
MCHC: 32.7 g/dL (ref 30.0–36.0)
MCV: 91.1 fL (ref 78.0–100.0)
MCV: 91.7 fL (ref 78.0–100.0)
PLATELETS: 11 10*3/uL — AB (ref 150–400)
Platelets: 13 10*3/uL — CL (ref 150–400)
RBC: 2.14 MIL/uL — AB (ref 4.22–5.81)
RBC: 2.4 MIL/uL — ABNORMAL LOW (ref 4.22–5.81)
RDW: 19.6 % — ABNORMAL HIGH (ref 11.5–15.5)
RDW: 18.3 % — AB (ref 11.5–15.5)
WBC: 10.7 10*3/uL — AB (ref 4.0–10.5)
WBC: 15.4 10*3/uL — AB (ref 4.0–10.5)

## 2016-10-05 LAB — BLOOD GAS, ARTERIAL
Acid-base deficit: 23.4 mmol/L — ABNORMAL HIGH (ref 0.0–2.0)
Acid-base deficit: 18.4 mmol/L — ABNORMAL HIGH (ref 0.0–2.0)
BICARBONATE: 8.4 mmol/L — AB (ref 20.0–28.0)
Bicarbonate: 12 mmol/L — ABNORMAL LOW (ref 20.0–28.0)
Drawn by: 270161
Drawn by: 29017
FIO2: 50
FIO2: 100
LHR: 18 {breaths}/min
O2 Saturation: 59.3 %
O2 Saturation: 81.8 %
PATIENT TEMPERATURE: 98.8
PCO2 ART: 61.3 mmHg — AB (ref 32.0–48.0)
PEEP: 5 cmH2O
PEEP: 8 cmH2O
Patient temperature: 97.6
RATE: 30 resp/min
VT: 500 mL
VT: 500 mL
pCO2 arterial: 61.9 mmHg — ABNORMAL HIGH (ref 32.0–48.0)
pH, Arterial: 6.918 — CL (ref 7.350–7.450)
pO2, Arterial: 37.3 mmHg — CL (ref 83.0–108.0)
pO2, Arterial: 48.8 mmHg — ABNORMAL LOW (ref 83.0–108.0)

## 2016-10-05 LAB — URINE CULTURE: Culture: >=100000 — AB

## 2016-10-05 LAB — PTT FACTOR INHIBITOR (MIXING STUDY)
APTT 11 MIX SALINE: 103.6 s
aPTT 1:1 NP Incub. Mix Ctl: 34.3 s — ABNORMAL HIGH (ref 22.9–30.2)
aPTT 1:1 NP Mix, 60 Min,Incub.: 32.9 s — ABNORMAL HIGH (ref 22.9–30.2)
aPTT 1:1 Normal Plasma: 29 s (ref 22.9–30.2)
aPTT: 69.1 s — ABNORMAL HIGH (ref 22.9–30.2)

## 2016-10-05 LAB — GLUCOSE, CAPILLARY
GLUCOSE-CAPILLARY: 363 mg/dL — AB (ref 65–99)
GLUCOSE-CAPILLARY: 242 mg/dL — AB (ref 65–99)
GLUCOSE-CAPILLARY: 155 mg/dL — AB (ref 65–99)
GLUCOSE-CAPILLARY: 228 mg/dL — AB (ref 65–99)
Glucose-Capillary: 198 mg/dL — ABNORMAL HIGH (ref 65–99)

## 2016-10-05 LAB — MAGNESIUM: MAGNESIUM: 1.5 mg/dL — AB (ref 1.7–2.4)

## 2016-10-05 LAB — PREPARE RBC (CROSSMATCH)

## 2016-10-05 LAB — PHOSPHORUS: Phosphorus: 2.7 mg/dL (ref 2.5–4.6)

## 2016-10-05 LAB — LACTIC ACID, PLASMA: Lactic Acid, Venous: 1.2 mmol/L (ref 0.5–1.9)

## 2016-10-05 MED ORDER — MAGNESIUM SULFATE 2 GM/50ML IV SOLN
2.0000 g | Freq: Once | INTRAVENOUS | Status: AC
  Administered 2016-10-05: 2 g via INTRAVENOUS
  Filled 2016-10-05: qty 50

## 2016-10-05 MED ORDER — FUROSEMIDE 10 MG/ML IJ SOLN
160.0000 mg | Freq: Three times a day (TID) | INTRAVENOUS | Status: DC
  Administered 2016-10-05: 160 mg via INTRAVENOUS
  Filled 2016-10-05 (×3): qty 16

## 2016-10-05 MED ORDER — SODIUM CHLORIDE 0.9 % IV SOLN
Freq: Once | INTRAVENOUS | Status: AC
  Administered 2016-10-05: 06:00:00 via INTRAVENOUS

## 2016-10-05 MED ORDER — SODIUM BICARBONATE 8.4 % IV SOLN
200.0000 meq | Freq: Once | INTRAVENOUS | Status: AC
  Administered 2016-10-05: 200 meq via INTRAVENOUS
  Filled 2016-10-05: qty 200

## 2016-10-05 MED ORDER — MORPHINE BOLUS VIA INFUSION
5.0000 mg | INTRAVENOUS | Status: DC | PRN
Start: 2016-10-05 — End: 2016-10-06
  Filled 2016-10-05: qty 20

## 2016-10-05 MED ORDER — MORPHINE SULFATE 25 MG/ML IV SOLN
10.0000 mg/h | INTRAVENOUS | Status: DC
  Administered 2016-10-05: 10 mg/h via INTRAVENOUS
  Filled 2016-10-05: qty 10

## 2016-10-05 NOTE — Progress Notes (Signed)
Pt desats and drops BP with movement and repositioning, for now will not turn pt due to his unstable condition. Will reassess and turn as pts condition allows.

## 2016-10-05 NOTE — Progress Notes (Signed)
Pt is being manually ventilated 100% with Peep Valve per desaturation. MD aware

## 2016-10-05 NOTE — Progress Notes (Signed)
[place] Progress Note Patient Name: Jason [name] DOB: 20-Jun-1986 MRN: [id number]   Date of Service    HPI/Events of Note  Mother of patient requesting update specifically concerning anasarca and renal function.  Based on review of patient records it appears that renal failure has been progressive over weeks and suspect is multifactorial, drugs/imaging dyes/ATN/hypotension, etc. In the setting of DM and chronic debilitating condition.    eICU Interventions  Reviewed that we are currently attempting to correct acidosis. Consider changing out suprapubic catheter even though no UOP and bladder scan negative for urinary retention Recommend nephrology consult     Intervention Category Major Interventions: Other:  Zori Benbrook , 12:36 AM

## 2016-10-05 NOTE — Progress Notes (Signed)
0500 - RT and RN at bedside manually ventilating pt after O2 sats maintained at 70-75%, Dr. Jimmy Footman on [place]. Pt responded to interventions  0530 - Two RT's, RN, Fuller Song PA at bedside and Dr Jimmy Footman on [place]. Manually ventilating pt and increased levophed gtt.   Pt is now maintaining O2 sats at 94-97%, please refer to respiratory's notes for further details.

## 2016-10-05 NOTE — Progress Notes (Signed)
Per CCM PA, increase PEEP to 15cmH2O. Pt placed back on a VT 450 after Dr. Jimmy Footman gave verbal order to titrate Tidal volume by 50cc's until peak pressures on the ventilator decreased.  Patient O2 saturations dropped to 79 in the interim of changing from PCV to [facility]; pt was lavaged and  manually ventilated in order to increase O2 saturations in the 90's.  Patient placed back on ventilator with vent setting of  VT 450, rate 30, 100%.

## 2016-10-05 NOTE — Progress Notes (Signed)
Patient is now on Pressure Control Ventilation per MD Detarding. PC 32 per MD. Pt is tolerating it well. Pt has decrease static compliance with increase PPlats and increase PIP in the MD aware. RN at bedside.

## 2016-10-05 NOTE — Progress Notes (Signed)
RT spoke with Dr. Jimmy Footman about concern with patient's peak pressures being high and plateau pressure not reading.  Suggested PCV and MD suggested titrating the tidal volume by 50cc's until peak pressures drop.  RT titrated Tidal volume to 450 with peak pressures in the low 40's.

## 2016-10-05 NOTE — Progress Notes (Signed)
eLink Physician-Brief Progress Note Patient Name: Jason [name] DOB: 03-Mar-1986 MRN: [id number]   Date of Service    HPI/Events of Note  Spoke with Mrs. Elgar Rappaport, who is the mother of the patient. She voices that given [name]'s deterioration and prognosis for meaningful recovery that she and the family are now read to move to comfort measures and allow [name] to pass with comfort and dignity.  eICU Interventions  Will order withdrawal of mechanical ventilation, vasopressors, NaHCO3 infusion and start a Morphine IV infusion per withdrawal of life sustaining measures protocol and allow the patient to pass with comfort and dignity.      Intervention Category Major Interventions: End of life / care limitation discussion  Lysle Dingwall , 7:19 PM

## 2016-10-05 NOTE — Progress Notes (Addendum)
Discussion with family at bedside.  MD placed orders for comfort care.  Withdrew care at approximately 01/31/14.  Patient time of death 01-Feb-2023.  No heart or lung sounds auscultated. Verified by Rhina Brackett RN.  200 mL of morphine WIS.   Clyda Hurdle RN

## 2016-10-05 NOTE — Consult Note (Signed)
Jason [name] is an 30 y.o. male referred by Dr Nelda Marseille   Chief Complaint: ARF HPI: 30 yo BM admitted 08/09/16 for decubitus ulcers.  Hx DM, spinal cord infarct and has been essentially bedridden.  Prolonged hospital course with infections, chronic hypotension and now decreasing UO.  Has baseline Scr 0.5 and has increased to 1.2 today.  UO has dropped off over last 2 days to 105cc and 150cc.  Has had SBP 70-90 range for weeks.  Renal US from last month showed bil increased echogenicity with repeat ordered today pending.  Fluid balance + >7L last few days.    Past Medical History:  Diagnosis Date  . Anemia   . Anxiety   . Arthritis   . Asthma   . 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 Colostomy in place [facility])   . Complication of anesthesia    difficulty waking up, will have sudden drop in BP and O2 sats  . Depression   . Diabetic neuropathy ([place])   . Diabetic retinopathy ([place])   . Dysrhythmia    usually due to medications  . Family history of anesthesia complication    Pt mother can't have epidural procedures  . Fibromyalgia   . Gastroparesis   . GERD (gastroesophageal reflux disease)   . H/O constipation   . H/O respiratory failure   . H/O thrombosis   . Hx MRSA infection    on face  . Multiple allergies 11/02/2015  . Pneumonia   . Recurrent UTI 11/02/2015  . Seizures ([place]) 2017   one time during admission in hosptital  . 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 Syncope 02/16/2015  . 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 Type I diabetes mellitus ([place])    sees Dr. Loanne Drilling     Past Surgical History:  Procedure Laterality Date  . APPLICATION OF A-CELL OF BACK N/A 11/12/2014   Procedure: APPLICATION A CELL AND VAC ;  Surgeon: Theodoro Kos, DO;  Location: [place];  Service: Plastics;  Laterality: N/A;  . APPLICATION OF A-CELL OF BACK N/A 12/08/2014   Procedure: APPLICATION OF A-CELL AND WOUND VAC ;  Surgeon: Theodoro Kos, DO;   Location: [place];  Service: Plastics;  Laterality: N/A;  . APPLICATION OF A-CELL OF CHEST/ABDOMEN N/A 08/04/2015   Procedure: APPLICATION OF A-CELL ;  Surgeon: Loel Lofty Dillingham, DO;  Location: [place];  Service: Plastics;  Laterality: N/A;  . COLOSTOMY    . DEBRIDMENT OF DECUBITUS ULCER N/A 10/04/2014   Procedure: DEBRIDMENT OF DECUBITUS ULCER;  Surgeon: Georganna Skeans, MD;  Location: [place];  Service: General;  Laterality: N/A;  . GASTROSTOMY TUBE PLACEMENT    . HEMATOMA EVACUATION N/A 05/05/2015   Procedure: EVACUATION HEMATOMA bedside procedure;  Surgeon: Theodoro Kos, DO;  Location: [place];  Service: Plastics;  Laterality: N/A;  . INCISION AND DRAINAGE OF WOUND N/A 11/12/2014   Procedure: IRRIGATION AND DEBRIDEMENT OF WOUNDS WITH BONE BIOPSY AND SURGICAL PREP ;  Surgeon: Theodoro Kos, DO;  Location: [place];  Service: Plastics;  Laterality: N/A;  . INCISION AND DRAINAGE OF WOUND N/A 11/18/2014   Procedure: IRRIGATION AND DEBRIDEMENT OF SACRAL ULCER ONLY WITH PLACEMENT OF A CELL AND VAC/ DRESSING CHANGE TO UPPER BACK AREA.;  Surgeon: Theodoro Kos, DO;  Location: [place];  Service: Plastics;  Laterality: N/A;  . INCISION AND DRAINAGE OF WOUND N/A 11/25/2014   Procedure: IRRIGATION AND DEBRIDEMENT OF SACRAL ULCER AND BACK BURN  WITH PLACEMENT OF A-CELL;  Surgeon: Theodoro Kos, DO;  Location: [place];  Service: Plastics;  Laterality: N/A;  . INCISION AND DRAINAGE OF WOUND Right 12/08/2014   Procedure: IRRIGATION AND DEBRIDEMENT SACRAL WOUND AND RIGHT ISCHIAL WOUND ;  Surgeon: Theodoro Kos, DO;  Location: [place];  Service: Plastics;  Laterality: Right;  . INCISION AND DRAINAGE OF WOUND Bilateral 05/05/2015   Procedure: IRRIGATION AND DEBRIDEMENT SACRAL ULCER;  Surgeon: Theodoro Kos, DO;  Location: [place];  Service: Plastics;  Laterality: Bilateral;  . INCISION AND DRAINAGE OF WOUND N/A 08/04/2015   Procedure: IRRIGATION AND DEBRIDEMENT OF SACRAL ULCER AND ;  Surgeon: Loel Lofty Dillingham, DO;  Location: [place];   Service: Plastics;  Laterality: N/A;  . INCISION AND DRAINAGE OF WOUND N/A 08/12/2016   Procedure: IRRIGATION AND DEBRIDEMENT SACRAL ULCER;  Surgeon: Loel Lofty Dillingham, DO;  Location: [place];  Service: Plastics;  Laterality: N/A;  [place]  . INSERTION OF SUPRAPUBIC CATHETER N/A 10/12/2014   Procedure: INSERTION OF SUPRAPUBIC CATHETER;  Surgeon: Reece Packer, MD;  Location: [place];  Service: Urology;  Laterality: N/A;  . IR GENERIC HISTORICAL  07/11/2016   IR CM INJ ANY COLONIC TUBE W/FLUORO 07/11/2016 Arne Cleveland, MD [facility]-INTERV RAD  . IR GENERIC HISTORICAL  09/03/2016   IR [place] TUBE PERCUT W/FLUORO 09/03/2016 Corrie Mckusick, DO [facility]-INTERV RAD  . IR GENERIC HISTORICAL  09/08/2016   IR FLUORO GUIDED NEEDLE PLC ASPIRATION/INJECTION LOC 09/11/2016 Luanne Bras, MD [facility]INTER[facility] RAD  . LAPAROSCOPIC DIVERTED COLOSTOMY N/A 10/12/2014   Procedure: LAPAROSCOPIC DIVERTING COLOSTOMY;  Surgeon: Donnie Mesa, MD;  Location: [place];  Service: General;  Laterality: N/A;  . MINOR APPLICATION OF WOUND VAC N/A 11/25/2014   Procedure:  WOUND VAC CHANGE;  Surgeon: Theodoro Kos, DO;  Location: [place];  Service: Plastics;  Laterality: N/A;  . MULTIPLE EXTRACTIONS WITH ALVEOLOPLASTY N/A 08/03/2014   Procedure: MULTIPLE EXTRACTIONS;  Surgeon: Gae Bon, DDS;  Location: [place];  Service: Oral Surgery;  Laterality: N/A;  . RADIOLOGY WITH ANESTHESIA N/A 07/05/2016   Procedure: RADIOLOGY WITH ANESTHESIA MRI OF PELVIS WITH AND WITHOUT;  Surgeon: Medication Radiologist, MD;  Location: [place];  Service: Radiology;  Laterality: N/A;  . RADIOLOGY WITH ANESTHESIA N/A 09/09/2016   Procedure: CERVICAL SPINE ASPIRATION;  Surgeon: Luanne Bras, MD;  Location: [place];  Service: Radiology;  Laterality: N/A;  . TEE WITHOUT CARDIOVERSION N/A 08/17/2014   Procedure: TRANSESOPHAGEAL ECHOCARDIOGRAM (TEE);  Surgeon: Dorothy Spark, MD;  Location: [place];  Service: Cardiovascular;  Laterality: N/A;  . TEE  WITHOUT CARDIOVERSION N/A 05/05/2015   Procedure: TRANSESOPHAGEAL ECHOCARDIOGRAM (TEE);  Surgeon: Lelon Perla, MD;  Location: [facility] ENDOSCOPY;  Service: Cardiovascular;  Laterality: N/A;  . TONSILLECTOMY      Family History  Problem Relation Age of Onset  . Diabetes Father   . Hypertension Father   . Heart disease Mother     congenital - MVP  . Asthma      fhx  . Hypertension      fhx  . Stroke      fhx   Social History:  reports that he quit smoking about 2 years ago. His smoking use included Cigars and Cigarettes. He has a 12.00 pack-year smoking history. He has never used smokeless tobacco. He reports that he does not drink alcohol or use drugs.  Allergies:  Allergies  Allergen Reactions  . Amikacin Sulfate Other (See Comments)    Seizure   . Cefuroxime Axetil Anaphylaxis  Tolerated cefepime 12/2015 - with Pepcid/Solu-Medrol  . Imipenem Other (See Comments)    SEIZURES, ? DOSE RELATED ?  [place] Penicillins Anaphylaxis and Other (See Comments)    Tolerated Imipenem; no reaction to 7 day course of amoxicillin in 2015 Has patient had a PCN reaction causing immediate rash, facial/tongue/throat swelling, SOB or lightheadedness with hypotension: Yes Has patient had a PCN reaction causing severe rash involving mucus membranes or skin necrosis: No Has patient had a PCN reaction that required hospitalization Yes Has patient had a PCN reaction occurring within the last 10 years: No If all of the above answers are "NO", then may proceed with Cephalo  . Sulfa Antibiotics Anaphylaxis and Shortness Of Breath  . Tessalon [Benzonatate] Anaphylaxis  . Aztreonam Swelling    FACIAL AND NECK SWELLING WITHOUT PREMEDICATION.  . Ertapenem Rash and Other (See Comments)    CONFUSION 'IMIPENEM RESPONSIBLE FOR SEIZURES'  . Levaquin [Levofloxacin In D5w] Swelling    Hand and forearm swelling  . Meropenem Other (See Comments)    ALTERED MENTAL STATUS, CONFUSION 'IMIPENEM RESPONSIBLE FOR SEIZURES'   . Morphine And Related Other (See Comments)    ALTERED MENTAL STATUS CONFUSION, HEADACHES VISUAL HALLUCINATIONS  . Nsaids Itching and Other (See Comments)    Risk of bleeding, itching  . Shellfish Allergy Itching    Took benadryl to alleviate reaction  . Rocephin [Ceftriaxone] Other (See Comments)    Confusion    Medications Prior to Admission  Medication Sig Dispense Refill  . acetaminophen (TYLENOL) 325 MG tablet Take 2 tablets (650 mg total) by mouth every 6 (six) hours as needed for mild pain, moderate pain, fever or headache.    . baclofen (LIORESAL) 20 MG tablet Take 1 tablet (20 mg total) by mouth 4 (four) times daily. 120 tablet 5  . BISACODYL 5 MG EC tablet Take ONE (1) tablet by mouth twice daily as needed FOR MODERATE CONSTIPATION (Patient taking differently: Take 5 MG by mouth twice daily as needed FOR MODERATE CONSTIPATION) 60 tablet 5  . buPROPion (WELLBUTRIN XL) 150 MG 24 hr tablet Take 1 tablet (150 mg total) by mouth daily. 30 tablet 11  . camphor-menthol (SARNA) lotion Apply topically as needed for itching. 222 mL 0  . collagenase (SANTYL) ointment Apply 1 application topically 3 (three) times a week. (Patient taking differently: Apply 1 application topically as needed (dressing change). ) 90 g 5  . dexlansoprazole (DEXILANT) 60 MG capsule Take 1 capsule (60 mg total) by mouth daily. 30 capsule 11  . docusate (COLACE) 50 MG/5ML liquid Take 10 mLs (100 mg total) by mouth 2 (two) times daily. (Patient taking differently: Take 100 mg by mouth daily. ) 473 mL 11  . fentaNYL (DURAGESIC - DOSED MCG/HR) 75 MCG/HR place 1 patch (85mg total) onto the skin every 3 (three) days 10 patch 0  . ferrous sulfate 325 (65 FE) MG tablet Take 1 tablet (325 mg total) by mouth daily with breakfast. 30 tablet 11  . glucagon (GLUCAGON EMERGENCY) 1 MG injection INJECT INTO MUSCLE ONCE AS NEEDED (Patient taking differently: Inject 1 mg into the muscle once as needed (hypoglycemia). INJECT INTO  MUSCLE ONCE AS NEEDED) 1 kit 0  . GLUCERNA (GLUCERNA) LIQD 1,000 mLs by PEG Tube route continuous. 70 ml/hr    . hydrocortisone (CORTEF) 10 MG tablet Take 1 tablet (10 mg total) by mouth every evening. Hold for the next 5 days, while using solumedrol. Then resume as previously instructed    . HYDROmorphone (DILAUDID)  8 MG tablet Take ONE (1) tablet (73m total) by mouth every 6 hours as needed for severe pain 120 tablet 0  . insulin aspart (NOVOLOG FLEXPEN) 100 UNIT/ML FlexPen Inject 0-10 Units into the skin 4 (four) times daily as needed for high blood sugar (CBG >200). Per sliding scale: CBG <200 = 0 units, 201-300 = 4 units, 301-400 = 6 units, >400 = 10 units    . insulin glargine (LANTUS) 100 UNIT/ML injection Inject 0.22 mLs (22 Units total) into the skin at bedtime. (Patient taking differently: Inject 22 Units into the skin every morning. )    . loratadine (CLARITIN) 10 MG tablet Take 1 tablet (10 mg total) by mouth daily. 30 tablet 11  . LYRICA 100 MG capsule Take ONE (1)  capsule by mouth three times daily (morning,afternoon and evening) and 2 capsules at bedtime (Patient taking differently: Take 100 mg by mouth three times a day (morning/afternoon/evening) and 200 mg at bedtime) 150 capsule 5  . Magnesium Oxide 400 (240 Mg) MG TABS Take ONE (1) tablet by mouth twice daily (Patient taking differently: Take 400 MG by mouth twice daily) 60 tablet 11  . midodrine (PROAMATINE) 10 MG tablet Take 2 tablets (20 mg total) by mouth 3 (three) times daily with meals. (Patient taking differently: Take 40 mg by mouth daily as needed. ) 180 tablet 6  . Multiple Vitamin (MULTIVITAMIN WITH MINERALS) TABS tablet Place 1 tablet into feeding tube daily.    .Earney NavyBicarbonate (ZEGERID OTC PO) Take 1 tablet by mouth as needed (for acid reflux or heartburn).    . ondansetron (ZOFRAN ODT) 8 MG disintegrating tablet Place 1 tablet (8 mg total) into feeding tube every 8 (eight) hours as needed for nausea or  vomiting. 60 tablet 5  . oxybutynin (DITROPAN) 5 MG tablet Place 1 tablet (5 mg total) into feeding tube 2 (two) times daily. 60 tablet 2  . Oxycodone HCl 20 MG TABS Take 1 tablet (20 mg total) by mouth every 6 (six) hours as needed (pain). 120 tablet 0  . VOLTAREN 1 % GEL apply 2 grams topically twice a day (Patient taking differently: Apply 2 grams topically twice a day as needed for shoulder pain) 100 g 5  . Water For Irrigation, Sterile (FREE WATER) SOLN Place 200 mLs into feeding tube every 8 (eight) hours. (Patient taking differently: Place 300-400 mLs into feeding tube every 8 (eight) hours. ) 10000 mL 5  . famotidine (PEPCID) 10 MG tablet Take 1 tablet (10 mg total) by mouth every 8 (eight) hours. (Patient not taking: Reported on 09/06/2016) 30 tablet 0  . famotidine (PEPCID) 20 MG tablet Take 1 tablet (20 mg total) by mouth 2 (two) times daily. Take around antibiotics infusion time (Patient not taking: Reported on 08/31/2016) 60 tablet 1  . hydrocortisone (CORTEF) 20 MG tablet Take 1 tablet (20 mg total) by mouth every morning. Hold for the next 5 days, while on solumedrol. Resume after this time as previously    . LORazepam (ATIVAN) 0.5 MG tablet Take 1 tablet (0.5 mg total) by mouth 3 (three) times daily as needed for anxiety. Reported on 11/02/2015 (Patient taking differently: Take 0.25-0.5 mg by mouth 3 (three) times daily as needed for anxiety. Reported on 11/02/2015) 90 tablet 5  . mupirocin ointment (BACTROBAN) 2 % Apply to affected area twice a day as needed 22 g 11     Lab Results: UA: TNTC RBC, TNTC WBC  Recent Labs  10/04/16 1649   0345  0955  WBC 17.9* 10.7* 15.4*  HGB 7.1* 6.5* 7.2*  HCT 22.2* 19.5* 22.0*  PLT 15* 11* 13*   BMET  Recent Labs  10/02/16 2000 10/03/16 0505 10/04/16 0445  0345  NA 140 138 132* 136  K 4.4 3.9 3.6 3.1*  CL 122* 119* 115* 108  CO2 12* 11* 10* 17*  GLUCOSE 362* 422* 332* 206*  BUN 59* 61* 65* 66*  CREATININE  1.16 1.02 1.12 1.21  CALCIUM 8.4* 8.3* 8.0* 7.4*  PHOS 4.4 4.1  --  2.7   LFT  Recent Labs  10/03/16 1400  PROT 5.3*  ALBUMIN 1.2*  AST 9*  ALT 6*  ALKPHOS 74  BILITOT 0.3  BILIDIR <0.1*  IBILI NOT CALCULATED   Dg Chest Port 1 View  Result Date:  CLINICAL DATA:  Respiratory failure are EXAM: PORTABLE CHEST 1 VIEW COMPARISON:  Chest radiograph 10/03/2016 FINDINGS: Tracheostomy tube tip is at the level of the clavicular heads. Cardiomediastinal contours are largely obscured. There diffuse bilateral airspace opacities, greatly worsened from the prior examination. IMPRESSION: Greatly worsened bilateral airspace opacities, left greater than right. 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 Electronically Signed   By: Ulyses Jarred M.D.   On:  05:48   US Abdomen Limited Ruq  Result Date: 10/03/2016 CLINICAL DATA:  Liver failure. History of diabetes, colostomy, osteomyelitis. EXAM: US ABDOMEN LIMITED - RIGHT UPPER QUADRANT COMPARISON:  CT abdomen and pelvis September 11, 2016 and abdominal ultrasound August 31, 2016 FINDINGS: Gallbladder: Layering sludge and tiny echogenic gallstones. Gallbladder wall thickening at 5 mm. No pericholecystic fluid. No sonographic Murphy's sign elicited. Common bile duct: Diameter: 5 mm. Liver: No focal lesion identified. Within normal limits in parenchymal echogenicity. Hepatopetal portal vein. Moderate amount of ascites.  Ascites. IMPRESSION: Gallbladder sludge and tiny stones. Mild wall thickening likely secondary to ascites without sonographic Murphy sign. Ascites. Electronically Signed   By: Elon Alas M.D.   On: 10/03/2016 22:18    ROS: Intubated and unable to give ROS  PHYSICAL EXAM: Blood pressure (!) 70/36, pulse 88, temperature 98.7 F (37.1 C), temperature source Oral, resp. rate (!) 31, height _0  (1.727 m), weight 66.2 kg (146 lb), SpO2 (!) 87 %. HEENT: Sedated NECK:trach LUNGS:Bilat crackles ant CARDIAC:RRR ABD:+ BS  ND, colostomy LLQ with air in bag, + Sub Q edema EXT:4+ edema GU: Suprapubic catheter NEURO:Sedated  Assessment: 1. ARF most likely due to  2. Sacral osteomyelitis 3. 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 Status epilepticus 5. DM 6. Resp Failure 7. Rt subclavian/brachial DVT from PICC.  Hx PE SP IVC filter 8. Klebsiella UTI 9. Hypomag SP replacement 10. HypoK  PLAN: 1. Spoke with mother and she is aware of situation and critical state of pt.  She is agreeable to treating what we can with meds but no HD.  Spoke to nurse who says suprapubic cath has been irrigated and it is not obstructed. 2. Will try to stimulate UO with high dose diuretics. 3. Daily labs 4. Prognosis poor   Nanako Stopher T , 12:42 PM

## 2016-10-05 NOTE — Progress Notes (Signed)
Subjective: Anasarca continues to worsen. No clinical seizure activity reported. Versed gtt was discontinued at 7:40 AM.   Objective: Current vital signs: BP (!) 68/40   Pulse 68   Temp 98.7 F (37.1 C) (Oral)   Resp (!) 27   Ht 5' 8"  (1.727 m)   Wt 66.2 kg (146 lb)   SpO2 (!) 79%   BMI 22.20 kg/m  Vital signs in last 24 hours: Temp:  [98.7 F (37.1 C)-100 F (37.8 C)] 98.7 F (37.1 C) (12/29 0745) Pulse Rate:  [65-105] 68 (12/29 1526) Resp:  [0-40] 27 (12/29 1526) BP: (68-108)/(25-69) 68/40 (12/29 1526) SpO2:  [77 %-100 %] 79 % (12/29 1526) FiO2 (%):  [100 %] 100 % (12/29 1526) Weight:  [66.2 kg (146 lb)] 66.2 kg (146 lb) (12/29 0500)  Intake/Output from previous day: 12/28 0701 - 12/29 0700 In: 7288.3 [I.V.:4773.3; NG/GT:2375; IV Piggyback:50] Out: 950 [Urine:150; Stool:800] Intake/Output this shift: Total I/O In: 1829.3 [I.V.:1102.7; Blood:266.7; NG/GT:375; IV Piggyback:85] Out: 265 [Urine:25; Stool:240] Nutritional status: Diet NPO time specified Except for: Sips with Meds  General Exam: On ventilator. Unresponsive.  Severe generalized edema involving the face, upper and lower extremities.  Neurologic Exam: Ment: Unarousable. Eyes closed with significant periorbital and eyelid swelling. No motor responses to any stimuli.  CN: No doll's eye reflex. Pupils unreactive. Absent corneal reflexes. No neck tone.   Motor/Sensory: Quadriplegic with no movement to stimuli and flaccid tone x 4.    Lab Results: Results for orders placed or performed during the hospital encounter of 08/28/2016 (from the past 48 hour(s))  Glucose, capillary     Status: Abnormal   Collection Time: 10/03/16  4:49 PM  Result Value Ref Range   Glucose-Capillary 348 (H) 65 - 99 mg/dL  Glucose, capillary     Status: Abnormal   Collection Time: 10/03/16 10:12 PM  Result Value Ref Range   Glucose-Capillary 340 (H) 65 - 99 mg/dL  Glucose, capillary     Status: Abnormal   Collection Time: 10/03/16  11:19 PM  Result Value Ref Range   Glucose-Capillary 344 (H) 65 - 99 mg/dL  CBC     Status: Abnormal   Collection Time: 10/04/16  4:45 AM  Result Value Ref Range   WBC 18.6 (H) 4.0 - 10.5 K/uL   RBC 2.63 (L) 4.22 - 5.81 MIL/uL   Hemoglobin 7.9 (L) 13.0 - 17.0 g/dL   HCT 24.8 (L) 39.0 - 52.0 %   MCV 94.3 78.0 - 100.0 fL   MCH 30.0 26.0 - 34.0 pg   MCHC 31.9 30.0 - 36.0 g/dL   RDW 20.6 (H) 11.5 - 15.5 %   Platelets 11 (LL) 150 - 400 K/uL    Comment: REPEATED TO VERIFY CONSISTENT WITH PREVIOUS RESULT CRITICAL VALUE NOTED.  VALUE IS CONSISTENT WITH PREVIOUSLY REPORTED AND CALLED VALUE.   Basic metabolic panel     Status: Abnormal   Collection Time: 10/04/16  4:45 AM  Result Value Ref Range   Sodium 132 (L) 135 - 145 mmol/L   Potassium 3.6 3.5 - 5.1 mmol/L   Chloride 115 (H) 101 - 111 mmol/L   CO2 10 (L) 22 - 32 mmol/L   Glucose, Bld 332 (H) 65 - 99 mg/dL   BUN 65 (H) 6 - 20 mg/dL   Creatinine, Ser 1.12 0.61 - 1.24 mg/dL   Calcium 8.0 (L) 8.9 - 10.3 mg/dL   GFR calc non Af Amer >60 >60 mL/min   GFR calc Af Amer >60 >60 mL/min  Comment: (NOTE) The eGFR has been calculated using the CKD EPI equation. This calculation has not been validated in all clinical situations. eGFR's persistently <60 mL/min signify possible Chronic Kidney Disease.    Anion gap 7 5 - 15  Glucose, capillary     Status: Abnormal   Collection Time: 10/04/16  4:50 AM  Result Value Ref Range   Glucose-Capillary 293 (H) 65 - 99 mg/dL  Glucose, capillary     Status: Abnormal   Collection Time: 10/04/16  7:56 AM  Result Value Ref Range   Glucose-Capillary 228 (H) 65 - 99 mg/dL  Glucose, capillary     Status: Abnormal   Collection Time: 10/04/16 12:09 PM  Result Value Ref Range   Glucose-Capillary 295 (H) 65 - 99 mg/dL  Pt factor inhibitor (mixing study)     Status: Abnormal   Collection Time: 10/04/16  3:00 PM  Result Value Ref Range   PT 20.8 (H) 9.6 - 11.5 sec   PT 1:1NP 11.6 (H) 9.6 - 11.5 sec   1  HR INCUB PT 1:1NP 11.3 9.6 - 11.5 sec    Comment: (NOTE) Performed At: [facility] [place], [place] [id number] Lindon Romp MD MG:[id number]   Ptt factor inhibitor (mixing study)     Status: Abnormal   Collection Time: 10/04/16  3:00 PM  Result Value Ref Range   aPTT 69.1 (H) 22.9 - 30.2 sec   aPTT 1:1 Normal Plasma 29.0 22.9 - 30.2 sec   aPTT 1:1 Mix Saline 103.6 Not Estab. sec   aPTT 1:1 NP Mix, 60 Min,Incub. 32.9 (H) 22.9 - 30.2 sec   aPTT 1:1 NP Incub. Mix Ctl 34.3 (H) 22.9 - 30.2 sec    Comment: (NOTE) Performed At: [facility] [place], [place] [id number] Lindon Romp MD DX:[id number]   CBC     Status: Abnormal   Collection Time: 10/04/16  4:49 PM  Result Value Ref Range   WBC 17.9 (H) 4.0 - 10.5 K/uL   RBC 2.39 (L) 4.22 - 5.81 MIL/uL   Hemoglobin 7.1 (L) 13.0 - 17.0 g/dL   HCT 22.2 (L) 39.0 - 52.0 %   MCV 92.9 78.0 - 100.0 fL   MCH 29.7 26.0 - 34.0 pg   MCHC 32.0 30.0 - 36.0 g/dL   RDW 20.2 (H) 11.5 - 15.5 %   Platelets 15 (LL) 150 - 400 K/uL    Comment: REPEATED TO VERIFY CRITICAL VALUE NOTED.  VALUE IS CONSISTENT WITH PREVIOUSLY REPORTED AND CALLED VALUE.   Glucose, capillary     Status: Abnormal   Collection Time: 10/04/16  4:49 PM  Result Value Ref Range   Glucose-Capillary 276 (H) 65 - 99 mg/dL   Comment 1 Capillary Specimen    Comment 2 Notify RN   Blood gas, arterial     Status: Abnormal   Collection Time: 10/04/16  5:00 PM  Result Value Ref Range   FIO2 50.00    Delivery systems VENTILATOR    Mode PRESSURE REGULATED VOLUME CONTROL    VT 500 mL   LHR 18 resp/min   Peep/cpap 5.0 cm H20   pH, Arterial BELOW REPORTABLE RANGE 7.350 - 7.450    Comment: CRITICAL RESULT CALLED TO, READ BACK BY AND VERIFIED WITH: DR. Oletta Darter AT 1710 BY KATEY FARGIS,RRT,RCP ON 10/04/16    pCO2 arterial 61.9 (H) 32.0 - 48.0 mmHg   pO2, Arterial 37.3 (LL) 83.0 - 108.0 mmHg    Comment: CRITICAL RESULT CALLED  TO, READ BACK  BY AND VERIFIED WITH: DR. Oletta Darter AT 1710 BY KATEY FARGIS,RRT,RCP ON 10/04/16 CORRECTED ON 12/29 AT 0744: PREVIOUSLY REPORTED AS BELOW REPORTABLE RANGE CRITICAL RESULT CALLED TO, READ BACK BY AND VERIFIED WITH: DR. Oletta Darter AT 1710 BY KATEY FARGIS,RRT,RCP ON 12/28/1    Bicarbonate 8.4 (L) 20.0 - 28.0 mmol/L   Acid-base deficit 23.4 (H) 0.0 - 2.0 mmol/L   O2 Saturation 59.3 %   Patient temperature 98.8    Collection site RIGHT RADIAL    Drawn by [id number]    Sample type ARTERIAL DRAW    Allens test (pass/fail) PASS PASS  I-STAT 3, arterial blood gas (G3+)     Status: Abnormal   Collection Time: 10/04/16  6:47 PM  Result Value Ref Range   pH, Arterial 6.818 (LL) 7.350 - 7.450   pCO2 arterial 59.7 (H) 32.0 - 48.0 mmHg   pO2, Arterial 55.0 (L) 83.0 - 108.0 mmHg   Bicarbonate 9.7 (L) 20.0 - 28.0 mmol/L   TCO2 11 0 - 100 mmol/L   O2 Saturation 58.0 %   Acid-base deficit 24.0 (H) 0.0 - 2.0 mmol/L   Patient temperature 98.6 F    Collection site RADIAL, ALLEN'S TEST ACCEPTABLE    Drawn by RT    Sample type ARTERIAL    Comment NOTIFIED PHYSICIAN   Glucose, capillary     Status: Abnormal   Collection Time: 10/04/16  8:24 PM  Result Value Ref Range   Glucose-Capillary 363 (H) 65 - 99 mg/dL  Glucose, capillary     Status: Abnormal   Collection Time:   1:10 AM  Result Value Ref Range   Glucose-Capillary 242 (H) 65 - 99 mg/dL  Lactic acid, plasma     Status: None   Collection Time:   1:30 AM  Result Value Ref Range   Lactic Acid, Venous 1.2 0.5 - 1.9 mmol/L  Blood gas, arterial     Status: Abnormal   Collection Time:   1:34 AM  Result Value Ref Range   FIO2 100.00    Delivery systems VENTILATOR    Mode PRESSURE REGULATED VOLUME CONTROL    VT 500 mL   LHR 30 resp/min   Peep/cpap 8.0 cm H20   pH, Arterial 6.918 (LL) 7.350 - 7.450    Comment: CRITICAL RESULT CALLED TO, READ BACK BY AND VERIFIED WITH: E. DETERDING MD AT 0140 BY NAKISHA SARPY RRT,RCP ON      pCO2 arterial 61.3 (H) 32.0 - 48.0 mmHg   pO2, Arterial 48.8 (L) 83.0 - 108.0 mmHg   Bicarbonate 12.0 (L) 20.0 - 28.0 mmol/L   Acid-base deficit 18.4 (H) 0.0 - 2.0 mmol/L   O2 Saturation 81.8 %   Patient temperature 97.6    Collection site RIGHT RADIAL    Drawn by [phone]    Sample type ARTERIAL DRAW    Allens test (pass/fail) PASS PASS  Glucose, capillary     Status: Abnormal   Collection Time:   3:41 AM  Result Value Ref Range   Glucose-Capillary 198 (H) 65 - 99 mg/dL  CBC     Status: Abnormal   Collection Time:   3:45 AM  Result Value Ref Range   WBC 10.7 (H) 4.0 - 10.5 K/uL   RBC 2.14 (L) 4.22 - 5.81 MIL/uL   Hemoglobin 6.5 (LL) 13.0 - 17.0 g/dL    Comment: REPEATED TO VERIFY CRITICAL RESULT CALLED TO, READ BACK BY AND VERIFIED WITH: J.DUNN,RN 5993  G.MCADOO    HCT 19.5 (  L) 39.0 - 52.0 %   MCV 91.1 78.0 - 100.0 fL   MCH 30.4 26.0 - 34.0 pg   MCHC 33.3 30.0 - 36.0 g/dL   RDW 19.6 (H) 11.5 - 15.5 %   Platelets 11 (LL) 150 - 400 K/uL    Comment: REPEATED TO VERIFY SPECIMEN CHECKED FOR CLOTS CRITICAL VALUE NOTED.  VALUE IS CONSISTENT WITH PREVIOUSLY REPORTED AND CALLED VALUE.   Basic metabolic panel     Status: Abnormal   Collection Time:   3:45 AM  Result Value Ref Range   Sodium 136 135 - 145 mmol/L   Potassium 3.1 (L) 3.5 - 5.1 mmol/L   Chloride 108 101 - 111 mmol/L   CO2 17 (L) 22 - 32 mmol/L   Glucose, Bld 206 (H) 65 - 99 mg/dL   BUN 66 (H) 6 - 20 mg/dL   Creatinine, Ser 1.21 0.61 - 1.24 mg/dL   Calcium 7.4 (L) 8.9 - 10.3 mg/dL   GFR calc non Af Amer >60 >60 mL/min   GFR calc Af Amer >60 >60 mL/min    Comment: (NOTE) The eGFR has been calculated using the CKD EPI equation. This calculation has not been validated in all clinical situations. eGFR's persistently <60 mL/min signify possible Chronic Kidney Disease.    Anion gap 11 5 - 15  Magnesium     Status: Abnormal   Collection Time:   3:45 AM  Result Value Ref Range    Magnesium 1.5 (L) 1.7 - 2.4 mg/dL  Phosphorus     Status: None   Collection Time:   3:45 AM  Result Value Ref Range   Phosphorus 2.7 2.5 - 4.6 mg/dL  Prepare RBC     Status: None   Collection Time:   4:14 AM  Result Value Ref Range   Order Confirmation ORDER PROCESSED BY BLOOD BANK   Type and screen [place]     Status: None (Preliminary result)   Collection Time:   5:00 AM  Result Value Ref Range   ISSUE DATE / TIME [id number]    Blood Product Unit Number [id number]    PRODUCT CODE [id number]    Unit Type and Rh 9500    Blood Product Expiration Date [id number]   Glucose, capillary     Status: Abnormal   Collection Time:   9:31 AM  Result Value Ref Range   Glucose-Capillary 155 (H) 65 - 99 mg/dL  CBC     Status: Abnormal   Collection Time:   9:55 AM  Result Value Ref Range   WBC 15.4 (H) 4.0 - 10.5 K/uL   RBC 2.40 (L) 4.22 - 5.81 MIL/uL   Hemoglobin 7.2 (L) 13.0 - 17.0 g/dL   HCT 22.0 (L) 39.0 - 52.0 %   MCV 91.7 78.0 - 100.0 fL   MCH 30.0 26.0 - 34.0 pg   MCHC 32.7 30.0 - 36.0 g/dL   RDW 18.3 (H) 11.5 - 15.5 %   Platelets 13 (LL) 150 - 400 K/uL    Comment: REPEATED TO VERIFY CRITICAL VALUE NOTED.  VALUE IS CONSISTENT WITH PREVIOUSLY REPORTED AND CALLED VALUE.    *Note: Due to a large number of results and/or encounters for the requested time period, some results have not been displayed. A complete set of results can be found in Results Review.    Recent Results (from the past 240 hour(s))  Culture, Urine     Status: Abnormal   Collection Time: 09/28/16 10:10 AM  Result  Value Ref Range Status   Specimen Description URINE, SUPRAPUBIC  Final   Special Requests Normal  Final   Culture (A)  Final    >=100,000 COLONIES/mL KLEBSIELLA PNEUMONIAE Confirmed Extended Spectrum Beta-Lactamase Producer (ESBL) RESULT CALLED TO, READ BACK BY AND VERIFIED WITH: Agnes Lawrence, NURSE AT [id number] ON [id number] BY S. YARBROUGH RESULT  REPEATED AND VERIFIED SINCE SENSITIVITIES WERE MULTI DRUG RESISTANT    Report Status 10/01/2016 FINAL  Final   Organism ID, Bacteria KLEBSIELLA PNEUMONIAE (A)  Final      Susceptibility   Klebsiella pneumoniae - MIC*    AMPICILLIN >=32 RESISTANT Resistant     CEFAZOLIN >=64 RESISTANT Resistant     CEFTRIAXONE >=64 RESISTANT Resistant     CIPROFLOXACIN >=4 RESISTANT Resistant     GENTAMICIN >=16 RESISTANT Resistant     IMIPENEM <=0.25 SENSITIVE Sensitive     NITROFURANTOIN 64 INTERMEDIATE Intermediate     TRIMETH/SULFA >=320 RESISTANT Resistant     AMPICILLIN/SULBACTAM >=32 RESISTANT Resistant     PIP/TAZO >=128 RESISTANT Resistant     Extended ESBL POSITIVE Resistant     * >=100,000 COLONIES/mL KLEBSIELLA PNEUMONIAE  Culture, Urine     Status: Abnormal   Collection Time: 10/03/16 12:00 PM  Result Value Ref Range Status   Specimen Description URINE, CLEAN CATCH  Final   Special Requests NONE  Final   Culture (A)  Final    >=100,000 COLONIES/mL KLEBSIELLA PNEUMONIAE SUSCEPTIBILITIES PERFORMED ON PREVIOUS CULTURE WITHIN THE LAST 5 DAYS.    Report Status  FINAL  Final    Lipid Panel No results for input(s): CHOL, TRIG, HDL, CHOLHDL, VLDL, LDLCALC in the last 72 hours.  Studies/Results: Dg Chest Port 1 View  Result Date:  CLINICAL DATA:  Respiratory failure are EXAM: PORTABLE CHEST 1 VIEW COMPARISON:  Chest radiograph 10/03/2016 FINDINGS: Tracheostomy tube tip is at the level of the clavicular heads. Cardiomediastinal contours are largely obscured. There diffuse bilateral airspace opacities, greatly worsened from the prior examination. IMPRESSION: Greatly worsened bilateral airspace opacities, left greater than right. 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 Electronically Signed   By: Ulyses Jarred M.D.   On:  05:48   US Abdomen Limited Ruq  Result Date: 10/03/2016 CLINICAL DATA:  Liver failure. History of diabetes, colostomy, osteomyelitis. EXAM: US  ABDOMEN LIMITED - RIGHT UPPER QUADRANT COMPARISON:  CT abdomen and pelvis September 11, 2016 and abdominal ultrasound August 31, 2016 FINDINGS: Gallbladder: Layering sludge and tiny echogenic gallstones. Gallbladder wall thickening at 5 mm. No pericholecystic fluid. No sonographic Murphy's sign elicited. Common bile duct: Diameter: 5 mm. Liver: No focal lesion identified. Within normal limits in parenchymal echogenicity. Hepatopetal portal vein. Moderate amount of ascites.  Ascites. IMPRESSION: Gallbladder sludge and tiny stones. Mild wall thickening likely secondary to ascites without sonographic Murphy sign. Ascites. Electronically Signed   By: Elon Alas M.D.   On: 10/03/2016 22:18    Medications:  Scheduled: . albuterol  2.5 mg Nebulization QID  . artificial tears   Both Eyes Q8H  . baclofen  10 mg Oral QID  . budesonide (PULMICORT) nebulizer solution  0.5 mg Nebulization BID  . chlorhexidine gluconate (MEDLINE KIT)  15 mL Mouth Rinse BID  . folic acid  1 mg Oral Daily  . free water  250 mL Per Tube Q6H  . furosemide  160 mg Intravenous Q8H  . insulin aspart  0-15 Units Subcutaneous Q4H  . insulin glargine  5 Units Subcutaneous Daily  . lacosamide (VIMPAT) IV  100 mg  Intravenous Q12H  . levETIRAcetam  1,500 mg Intravenous Q12H  . mouth rinse  15 mL Mouth Rinse 10 times per day  . midodrine  20 mg Per Tube TID WC  . oxybutynin  5 mg Per Tube BID  . pantoprazole sodium  40 mg Per Tube Daily  . sodium chloride flush  10-40 mL Intracatheter Q12H  . topiramate  100 mg Oral BID  . 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 1. Although not described on the official Radiology report, on my review of the images there is diffuse cortical signal abnormality most consistent with vasogenic edema on the T2-weighted and FLAIR  pulse sequences of the MRI from 12/21, which would explain essential lack of cortical function on neurological examinations while off sedation.  DDx for underlying etiology includes toxic/metabolicinsults as well as diffuse neuronal damage from generalized subclinical seizure activity. Seizures were initially difficult to control, requiring 3 scheduled anticonvulsants as well as sedation. He had recurrence of clinical seizure activity involving the face earlier this week after discontinuation of Versed, requiring re-initiation of Versed drip. Regarding possible toxic etiology, seizures were initially thought to be antibiotic related. He was on antibiotics which lower seizure threshold (imipenem) and initial seizures may have been precipitated by such.  2. Additional possibility is neurotoxicity secondary to beta-lactam antibiotics, as his mother reports multiple episodes ofdecreased mental status, including obtundation, with beta-lactam antibiotics in the past. However, he has been off of all antibiotics for about 2.5 weeks and has not improved. Has apparently had steroid responsive encephalopathy at least twice in the past(though the encephalopathy was blamed on antibiotics). An autoimmune encephalopathy that flares with infections is a consideration. It was felt that a trial of steroids/IVIG would be reasonable given his persistent dismal exam; however neither pulsed dose steroids nor IVIG has resulted in improvement (he is on day 5 of both).  3. Also on DDx for etiology is diffuse cortical damage secondary to refractory status epilepticus. 4.  Cervical puncture showed no evidence of infection. Elevated protein of unclear significance as this can be seen in many disorders, including status epilepticus.  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 Anti-platelet therapy has been held due to thrombocytopenia.  6. Repeat continuous EEG 12/19-12/20  without evidence of ongiong seizure activity.  7. Temperature has been fluctuating over the last 48 hours hours between 93.6 and 99.3 but somewhat stabilized most recent 24 hour period. Also hypotensive. Unstable vital signs are felt most likely to be due to hypothalamic damage, suggesting that the deep grey matter structures are also likely affected by the underlying process that resulted in the cortical edema seen on MRI. Significant diffuse brainstem damage is most likely present as well, based upon abnormal CN findings. 8. Neurological prognosis is felt to be poor. There is almost no chance for a meaningful neurological recovery in my opinion.  9. Could monitor, with mother's consent, over an additional 7 days to determine if there has been a response to IVIG. This will need to be done without any residual sedative effects that could compromise Neurological assessment. Versed is metabolized by the liver but renally excreted; most likely it will take significantly longer to clear this medication given the patient's renal failure.     Recommendations: 1. Continue Depakote 1gm Q6H 2. Continue Keppra  1548m BID 3. Continue Topamax1037mBID 4. IVIG2g/kg iv over 5 days was completed on 12/27.  5. Solumedrol 500 mg BID x 10 doses total was completed yesterday (12/28). 6. Continuing to hold ASA due to thrombocytopenia. Platelets 13K today (12/29). 7. Continue Vimpat 100 mg IV BID.    LOS: 40 days   @Electronically  signed: Dr. ErKerney Elbe2/29/2017  4:19 PM

## 2016-10-05 NOTE — Progress Notes (Signed)
RT was called to room to begin with the withdrawal of life protocol.  RT removed patient from ventilator with trach still in place.

## 2016-10-05 NOTE — Progress Notes (Signed)
Notified Dr. Oletta Darter pts family is ready to make him comfortable. Awaiting MD orders. Will continue to monitor closely.

## 2016-10-05 NOTE — Ethics Note (Signed)
Ethics Committee Consult  Date:  Time: 1:29 AM  Primary Committee Member:  Liston Alba, MD Secondary Committee Member:  Madison Hickman, MD  Does the patient have decision making capacity?  No - Pt unresponsive and on ventilator.  Does the patient have an Scientist, physiological or [place]?  Unknown  Legal Decision Maker:  [name] and Clarisa Kindred. However SH identifies that patient has 2 children ages unknown. Relationship to Patient:  Parents Mitigating Factors:  No  Name and contact information for person who requested the consult:  [name], MD  Attending Physician:  Rush Farmer, MD Was attending physician notified?  Yes  Other individuals involved, present/attending, their names, relationship/role to the patient:  Kerney Elbe, MD- Attending Neurologist  Individuals involved but unable to attend/participate their names, relationship/role to the patient:  N/A- Initial Review of Case.  Reason for the consult / ethical problem (eg. Goals of care, values clarification, forum for discussion, conflict resolution, autonomy, beneficence, etc.):  Beneficence and Goals of care.  Preliminary Information: Pt with intractable seizures of unclear etiology and in medication induced for 24 days despite 3 antiepileptic drugs (AED). Wake up evaluation shows absence of higher cortical functioning. He also has developed anuria and worsening renal function as well as dependence on Neosynephrine for pressor support. Medical futility and palliative care consult have been discussed with parents who want to continue aggressive care at this time and have refused initiation of a palliative care consult.    Objective facts related to or contributing to the problem (eg.  Pertinent medical history, facts of the case):   This is a 30 y/o patient with a pre-admission condition of functional quadriplegia, DM Type 1, multiple pressures ulcers, Diverting Colostomy,Adrenal  insufficiency, Chronic respiratory failure with tracheostomy, PEG tube. He was admitted on 11/19/201 with sepsis and blood in the Colostomy. He became  increasingly neurologically unresponsive within 2-3 days after admission with subsequent improvement of Neurological status back to baseline. He was transferred [facility] on 09/07/2016. On 09/10/2016, Pt was found ot be in Tonic-Clonic Seizure and was started on AED's. An overnight EEG showed Generalized Status Epilepticus with subsequent control via induced medication coma. Pt has since been placed on Topamax, Depakote and Keppra without control of seizure activity. Thus he has required maintenance of a medication induced coma. Wake up evaluation has shown no doll's eye reflex, unreactive pupils and decreased corneal reflexes. Additionally patient is edematous with a 10.9 kg (24 #) weight gain since admission. He has also developed anuria with worsening Creatinine in the last 24 hours. Pt also has a Metabolic Acidosis. He also remains PRVC ventilator dependent.   Summary of consultation and recommendations:    1.  Palliative Care consult to address suffering in patient and help to facilitate Goals of Care. 2. Medical Team to refer to futility policy for guidance.  3. Consultants to give clear prognostic recommendations and plan. 4. Anticipate an Ethics facilitated meeting if still unable to come to appropriate joint decision making regarding patients care after Palliative Care Consult initiated.  All of the above discussed with Dr. Nelda Marseille.  Thanks for allowing my involvement in the care of this patient. I can be reached for further questions at pager [phone].   Deforrest Bogle A.

## 2016-10-05 NOTE — Progress Notes (Signed)
Clinical Social Worker met with family at bedside to offer support. Patient mother Ms. Diffley stated patient lives in the house with her and she provides for him. Mother stated that patient does not have children nor is he married. Patient mother stated that she makes decision on patient's  Behalf. CSW remains available for support   Rhea Pink, MSW,  [place]

## 2016-10-05 NOTE — Plan of Care (Cosign Needed)
  Interdisciplinary Goals of Care Family Meeting   Date carried out::   Location of Jason meeting: Bedside  Member's involved: Nurse Practitioner, Bedside Registered Nurse and Family Member or next of kin  Durable Power of Attorney or acting medical decision maker:  Mother - Jason [name] and Father Jason [name].     Discussion: We discussed goals of care for Jason [name] .  NP called Jason [name] earlier in Jason day regarding patient's decline.  Updated her on worsening acidosis, worsening renal failure, increased vasopressor needs and no change in neuro exam.  Discussed lab findings with mother (who is an RT).  Jason [name] has had suffered over Jason years and fought a hard battle.  Family is understanding of all current medical issues.  Mother spoke with Dr. Earnestine Leys from Neurology regarding prior scans.  Unfortunately, Jason patient's father was admitted last night to 5w with anemia.  I went to his bedside and updated him on his son's decline.  We will make every effort to get his father to Jason bedside to visit with his son.  Dr. Nelda Marseille aware of discussions and in agreement on plan of care.  Anticipate hours to day.    Code status:  DNR, continue current medical care in place.    Disposition: Continue current acute care  Time spent for Jason meeting:  60 minutes     Jason Gens, NP-C [place] Pgr: [phone] or if no answer [phone] , 1:50 PM

## 2016-10-05 NOTE — Progress Notes (Signed)
Name: Jason [name] MRN: [id number] DOB: March 08, 1986    ADMISSION DATE:  08/15/2016 CONSULTATION DATE:  11/21  REFERRING MD :  Dr. 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  He has hx of C6 spine injury with quadriplegia, tracheostomy status.   SUBJECTIVE:   RN reports pt had episode of desaturation overnight requiring BVM, levophed gtt increased to 50 mcg, worsening acidosis > pt started on bicarb gtt,  Hgb decreased > 1 unit PRBC given   VITAL SIGNS: BP (!) 90/54 (BP Location: Left Leg)   Pulse 97   Temp 98.7 F (37.1 C) (Oral)   Resp (!) 31   Ht 5\' 8"  (1.727 m)   Wt 146 lb (66.2 kg)   SpO2 96%   BMI 22.20 kg/m   INTAKE/OUTPUT: I/O last 3 completed shifts: In: 11383.3 [I.V.:6928.3; Other:180; NG/GT:3225; IV Piggyback:1050] Out: 15 [Urine:185; Stool:800]  PHYSICAL EXAMINATION: General: Chronically, critically ill appearing young male in NAD, trached on vent HEENT: Tracheostomy clean, no drainage, bloody oral secretions Cardiovascular:  s1s2 regular rhythm Pulmonary: resps even non labored on vent, scattered rhonchi  Abdomen: Soft. Normal bowel sounds. Non distended, colostomy, PEG c/d/i Neurological: no response to verbal stimuli, pupils 3 mm =/R  EXT:  RUE>LUE edema, increased anasarca ++  LABS: CMP Latest Ref Rng & Units  10/04/2016 10/03/2016  Glucose 65 - 99 mg/dL 206(H) 332(H) 422(H)  BUN 6 - 20 mg/dL 66(H) 65(H) 61(H)  Creatinine 0.61 - 1.24 mg/dL 1.21 1.12 1.02  Sodium 135 - 145 mmol/L 136 132(L) 138  Potassium 3.5 - 5.1 mmol/L 3.1(L) 3.6 3.9  Chloride 101 - 111 mmol/L 108 115(H) 119(H)  CO2 22 - 32 mmol/L 17(L) 10(L) 11(L)  Calcium 8.9 - 10.3 mg/dL 7.4(L) 8.0(L) 8.3(L)  Total Protein 6.5 - 8.1 g/dL - - 5.3(L)    Total Bilirubin 0.3 - 1.2 mg/dL - - 0.3  Alkaline Phos 38 - 126 U/L - - 74  AST 15 - 41 U/L - - 9(L)  ALT 17 - 63 U/L - - 6(L)    CBC Latest Ref Rng & Units  10/04/2016 10/04/2016  WBC 4.0 - 10.5 K/uL 10.7(H) 17.9(H) 18.6(H)  Hemoglobin 13.0 - 17.0 g/dL 6.5(LL) 7.1(L) 7.9(L)  Hematocrit 39.0 - 52.0 % 19.5(L) 22.2(L) 24.8(L)  Platelets 150 - 400 K/uL 11(LL) 15(LL) 11(LL)    Imaging: Dg Abd 1 View  Result Date: 10/03/2016 CLINICAL DATA:  Possibly ileus. EXAM: ABDOMEN - 1 VIEW COMPARISON:  12/16/ 2017. FINDINGS: Soft tissue structures are unremarkable. No bowel distention. NG tube noted good anatomic position. IVC filter noted with its tip at the L2. L2 compression fracture again noted. Chronic destructive changes of the left ischium noted. Reference is made to recent CT report for discussion of osteomyelitis present. IMPRESSION: 1.  Gastrostomy tube in good anatomic position. 2. IVC filter noted in stable position. 3. L2 compression fracture with possible destructive changes again noted. Reference is made to CT report for further discussion of other bony findings present. Electronically Signed   By: Marcello Moores  Register   On: 10/03/2016 11:49   Dg Chest Port 1 View  Result Date:  CLINICAL DATA:  Respiratory failure are EXAM: PORTABLE CHEST 1 VIEW COMPARISON:  Chest radiograph 10/03/2016 FINDINGS: Tracheostomy tube tip is at the level of  the clavicular heads. Cardiomediastinal contours are largely obscured. There diffuse bilateral airspace opacities, greatly worsened from the prior examination. IMPRESSION: Greatly worsened bilateral airspace opacities, left greater than right. 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 Electronically Signed   By: Ulyses Jarred M.D.   On:  05:48   US Abdomen Limited Ruq  Result Date: 10/03/2016 CLINICAL DATA:  Liver failure. History of diabetes, colostomy, osteomyelitis. EXAM: US ABDOMEN LIMITED - RIGHT UPPER QUADRANT COMPARISON:  CT  abdomen and pelvis September 11, 2016 and abdominal ultrasound August 31, 2016 FINDINGS: Gallbladder: Layering sludge and tiny echogenic gallstones. Gallbladder wall thickening at 5 mm. No pericholecystic fluid. No sonographic Murphy's sign elicited. Common bile duct: Diameter: 5 mm. Liver: No focal lesion identified. Within normal limits in parenchymal echogenicity. Hepatopetal portal vein. Moderate amount of ascites.  Ascites. IMPRESSION: Gallbladder sludge and tiny stones. Mild wall thickening likely secondary to ascites without sonographic Murphy sign. Ascites. Electronically Signed   By: Elon Alas M.D.   On: 10/03/2016 22:18    SIGNIFICANT EVENTS: 11/22 - surgical bx sacral bone/wound 12/04 - transfer to ICU with status epilepticus 12/07 - eeg remains c poor neurostatus 12/17 - No seizures overnight, versed drip at 30mg /hr, intermittent hypothermia, decreased PEG tube drainage.  Versed d/c'd 12/18 - cervical puncture  12/25 - seizures, loaded with versed, IVIG per neuro 12/27 - worsening generalized edema, 3L overnight for decreased UOP, on neo, sr cr doubled, WBC up  12/28 - Ethics committee consult called per ID 12/29 - Ongoing discussion with mother, worsening acidosis, AKI, nephrology called  LINES: PICC LUE 12/15 >>  Trach (chronic) >>  STUDIES:  EEG 12/06 >> burst suppression MRI brain 12/09 >> acute infarct Lt paracentral mid pons 3 mm, chronic Rt cerebellar infarct, atrophy MRI Brain 12/22 >> persistant punctate diffusion abnormality at previously noted small L paramedian pontine infarct, small chronic right cerebellar infarcts, stable.  MICROBIOLOGY:  Blood Ctx x3 11/19:  3/3 Streptococcus constellatus  Urine Ctx 11/19:  Acinetobacter & Enterococcus faecalis MRSA PCR 11/20:  Positive Sacral Bone Ctx 11/22:  ESBL Kleb pna, Enterococcus faecalis, strep constellatus Blood Ctx x2 11/23:  Negative  Tracheal Asp Ctx 11/23:  Klebsiella pneumoniae & Acinetobacter Tracheal  Asp Ctx 11/24:  Klebsiella pneumoniae & Acinetobacter Tracheal Asp Ctx 11/30:  Pseudomonas aeruginosa & Stenotrophomonas  Urine Culture 12/22 >> ESBL klebsiella >> S- imipenem  ANTIBIOTICS:  Tressie Ellis 12/24 >> 12/27  ASSESSMENT / PLAN:  Acute encephalopathy Status epilepticus >> possibly related to Abx vs meningitis  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 CSF negative for infection (cervical puncture) C6 spine injury with quadriplegia. - AEDs per neurology > keppra, depakote, topamax - Versed gtt off 12/29 - Completed IVIG, solumedrol  - Serial neuro exams  Acute on chronic respiratory failure. Small Bilateral Pleural Effusions - suspect in setting of protein calorie malnutrition Tracheostomy status. - PRVC 8cc/kg - no weaning with hemodynamic instability  - Defer trach change for now. - F/u CXR PRN.    HCAP, recurrent UTI, sacral osteomyelitis, sacral/heel wounds. Pseudomonas in sputum is sens to ceftaz only & klebs in urine is ESBL org Leucocytosis ESBL Klebsiella UTI - defer abx to ID, appreciate input - WOC for dressing recommendations - Trend CBC/ fever curve - follow cultures as above   Non-anion gap Metabolic acidosis in setting of hyperchloremia  Hypomagenesemia  Hypokalemia/ hypophos AKI - sr cr doubled over last week (12/27) - Replace electrolytes as indicated - Monitor BMP / UOP . Oliguria & worsening sr.   -  Not an outpatient HD candidate - Nehprology consult > worsening acidosis, doubt he is a candidate for HD.  High risk catheter placement.  - Continue free water 250 q6 - Levo to ensure adequate renal perfusion   Anemia of critical illness - tx 1 unit PRBC 12/29 Thrombocytopenia - changed to argatroban 12/23 with thrombocytopenia  - Trend CBC  - Transfuse per ICU protocol for hgb <7  - Consider restart heparin when platelets improve     Rt subclavian/brachial DVT 2nd to PICC line 11/27. Repeat US 12/15 w/ cont extensive disease of the right sided venous  system Hx of PE s/p IVC filter. Chronic diastolic CHF. Hypotension 2nd to sedation. Thrombocytopenia on heparin - HIT ruled out Anemia  - ct Midodrine - HIT panel negative, d/c argatroban orders.  Consider restart heparin gtt once platelets improve.   - monitor for bleeding    Severe protein calorie malnutrition. DM gastroparesis. PEG Malfunction - IR called 12/24 (advanced, 12/25)  - continue TF for nutrition, increase rate, monitor for leak - protonix for SUP   DM type 1. Hypoglycemic episodes 12/22, 12/25 - SSI, adjust to moderate scale - low dose lantus >> note  has had multiple hypoglycemic events due to TF's being on/off with PEG leaking.     FAMILY:  Father Timmothy Sours) admitted to the hospital 12/29 early am.  Mother, [employer], called for update 12/29.  Discussed worsening of patients status and concerns for impending cardiac arrest.  Recommended no CPR in the event of arrest.  Mother states she is coming into the hospital.  Updated her on all of the aggressive support measures currently in place.     CC Time: 22 minutes  Noe Gens, NP-C [facility] Pgr: [phone] or if no answer [phone] , 9:53 AM  Attending Note:  30 year old male with extensive PMH who remains unresponsive, septic with elevated WBC, low platelets and INR>3. On exam, remains completely unresponsive. I reviewed CXR myself, trach in good position and diffuse opacities noted. Low plat: Sepsis, abx. Elevated INR, LFTs normal, U/S . Neuro: IVIg and solumedrol day 5/5 completed 12/29. May need to change line if able (unable to due to coagulopathy). Complete renal failure at this point, called consult to the renal service but doubt is a dialysis candidate.  Acidosis much worse this AM, refractory shock, I do not see a situation where [place] will survive this hospitalization or this day.  Had extensive conversation with family today.  Spoke with ethics at length yesterday and  renal today, after prolonged discussion decision was made to make patient a full DNR, realistically with no further escalation of care.  Mother was made aware that unfortunately [place] may die today, she is appropriately upset and I fully sympathize with her grief.  The patient is critically ill with multiple organ systems failure and requires high complexity decision making for assessment and support, frequent evaluation and titration of therapies, application of advanced monitoring technologies and extensive interpretation of multiple databases.   Critical Care Time devoted to patient care services described in this note is 45 Minutes. This time reflects time of care of this signee Dr Jennet Maduro. This critical care time does not reflect procedure time, or teaching time or supervisory time of PA/NP/Med student/Med Resident etc but could involve care discussion time.  Rush Farmer, M.D. [facility] Pulmonary/Critical Care Medicine. Pager: [phone]. After hours pager: [phone].

## 2016-10-05 NOTE — Progress Notes (Signed)
[facility] Progress Note Patient Name: Jason [name] DOB: 1986-06-24 MRN: [id number]   Date of Service    HPI/Events of Note  Hgb drop from 7.1 to 6.5.  Plt of 11  eICU Interventions  Plan: Transfuse 1 unit pRBC Post-transfusion CBC     Intervention Category Intermediate Interventions: Other:  DETERDING,ELIZABETH , 4:16 AM

## 2016-10-06 LAB — TYPE AND SCREEN
Blood Product Expiration Date: 201712302359
ISSUE DATE / TIME: 201712290601
Unit Type and Rh: 9500

## 2016-10-06 IMAGING — CR DG CHEST 2V
2 series · 2 of 2 positions shown · non-contrast
Comparison: 07/18/2014

CLINICAL DATA: Hypotension, history of stroke, left arm weakness

EXAM:
CHEST  2 VIEW

[w chest lat]
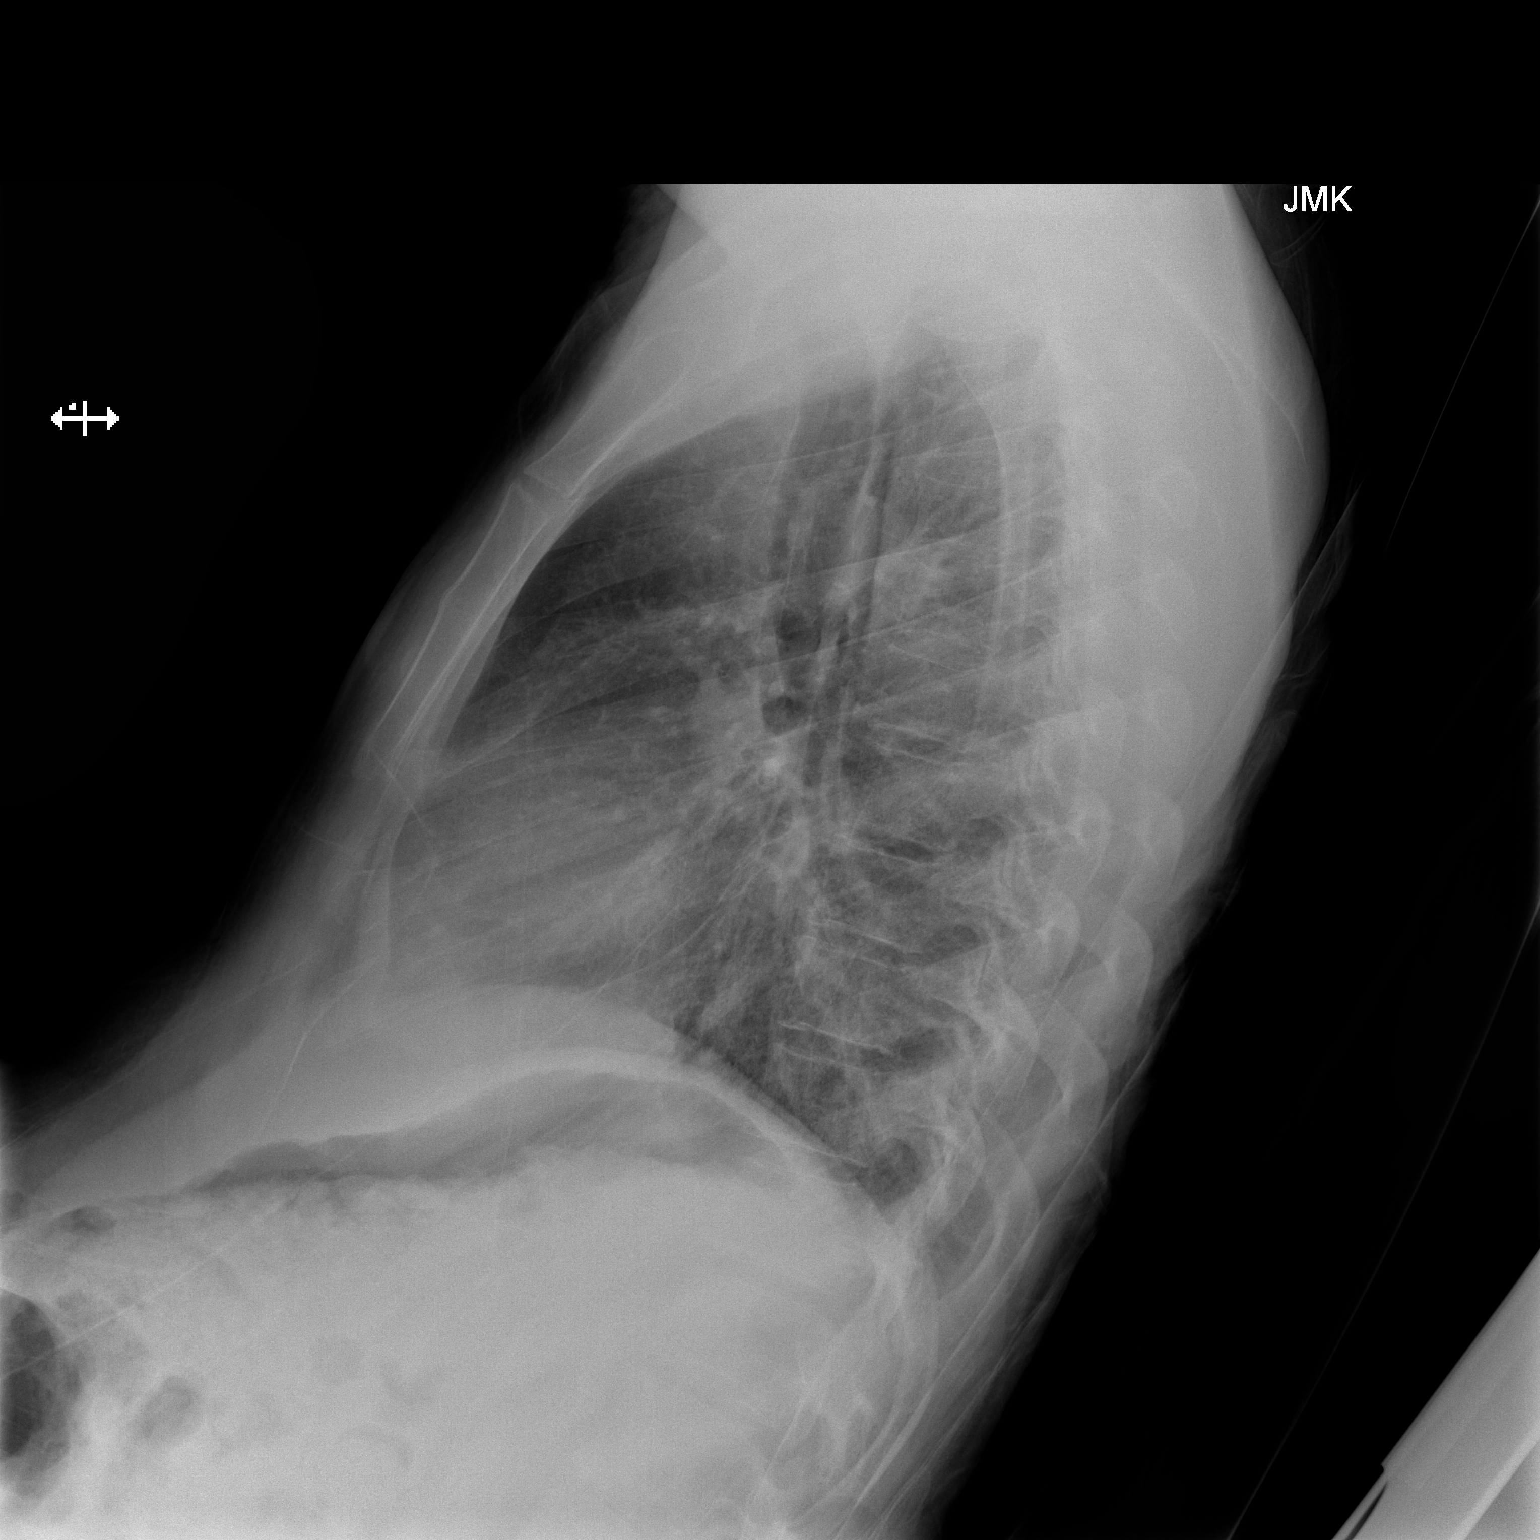

[x chest ap]
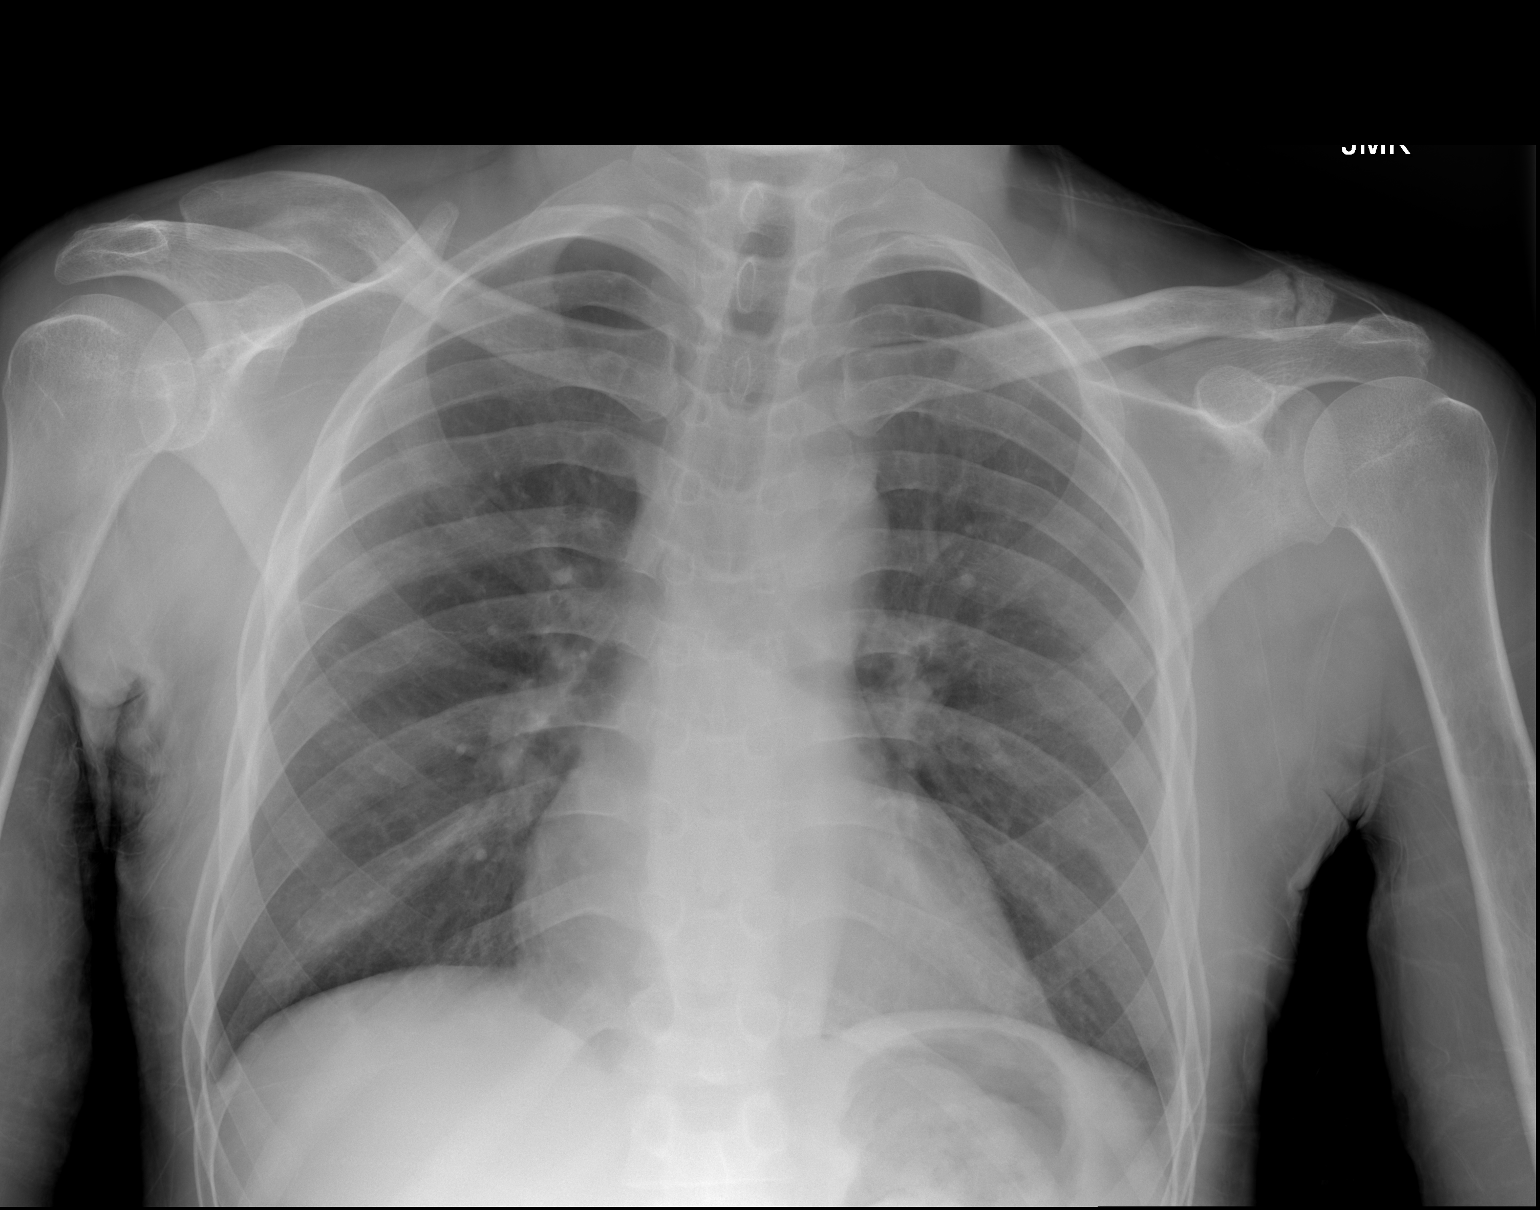

[2 of 2 positions shown; findings below may reference images not displayed]

FINDINGS: Cardiomediastinal silhouette is stable. No acute infiltrate or
pleural effusion. No pulmonary edema. Stable old fracture deformity
distal left clavicle.
IMPRESSION: No active cardiopulmonary disease.

## 2016-10-08 DEATH — deceased

## 2016-10-09 ENCOUNTER — Telehealth: Payer: Self-pay | Admitting: Family Medicine

## 2016-10-09 ENCOUNTER — Telehealth: Payer: Self-pay

## 2016-10-09 NOTE — Telephone Encounter (Signed)
Error/njr °

## 2016-10-09 NOTE — Telephone Encounter (Signed)
On 10/09/16 I received a death certificate from [place] [place] (original). The death certificate is for cremation. The patient is a patient of Doctor Nelda Marseille. The death certificate will be taken to [name] [id number] [facility] [place]) Monday 10/15/16 for signature.  On 10-30-16 I received the death certificate back from Doctor Nelda Marseille. I got the death certificate ready and called the funeral home to let them know the death certificate is ready for pickup. I also faxed a copy to the funeral home per the funeral home request.

## 2016-10-10 LAB — MISC LABCORP TEST (SEND OUT): Labcorp test code: 9985

## 2016-10-20 IMAGING — CR DG CHEST 1V PORT
1 series · 1 of 1 positions shown · non-contrast
Comparison: 07/29/2014

CLINICAL DATA: Fever D57.L (DDI-L7-CM). Altered mental status.
Recent dental surgery.

EXAM:
PORTABLE CHEST - 1 VIEW

[view not recorded]
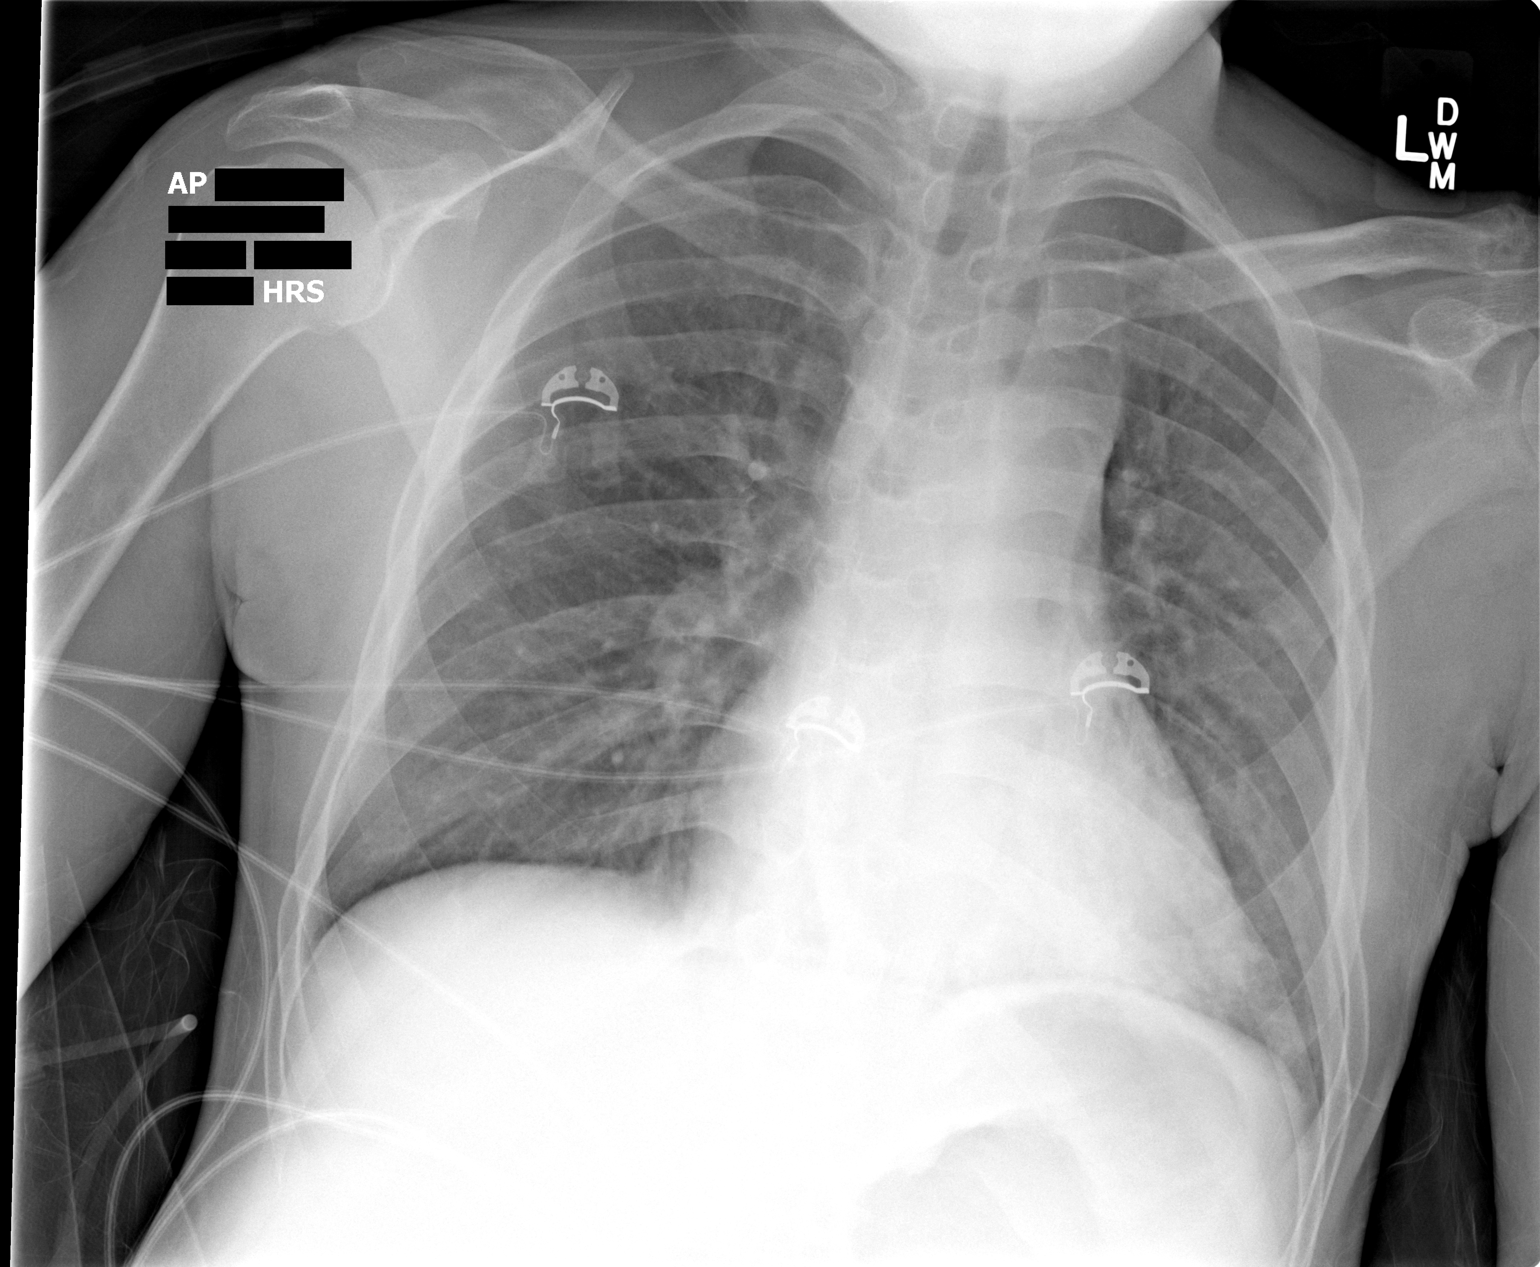

[1 of 1 positions shown; findings below may reference images not displayed]

FINDINGS: New subtle densities at the left lung base. Heart size is normal.
Trachea is midline. Negative for a pneumothorax.
IMPRESSION: Subtle densities at the left lung base. Findings most likely
represent atelectasis.

## 2016-10-21 IMAGING — CR DG CHEST 1V PORT
1 series · 1 of 1 positions shown · non-contrast
Comparison: 08/12/2014

CLINICAL DATA: Intubation.

EXAM:
PORTABLE CHEST - 1 VIEW

[AP]
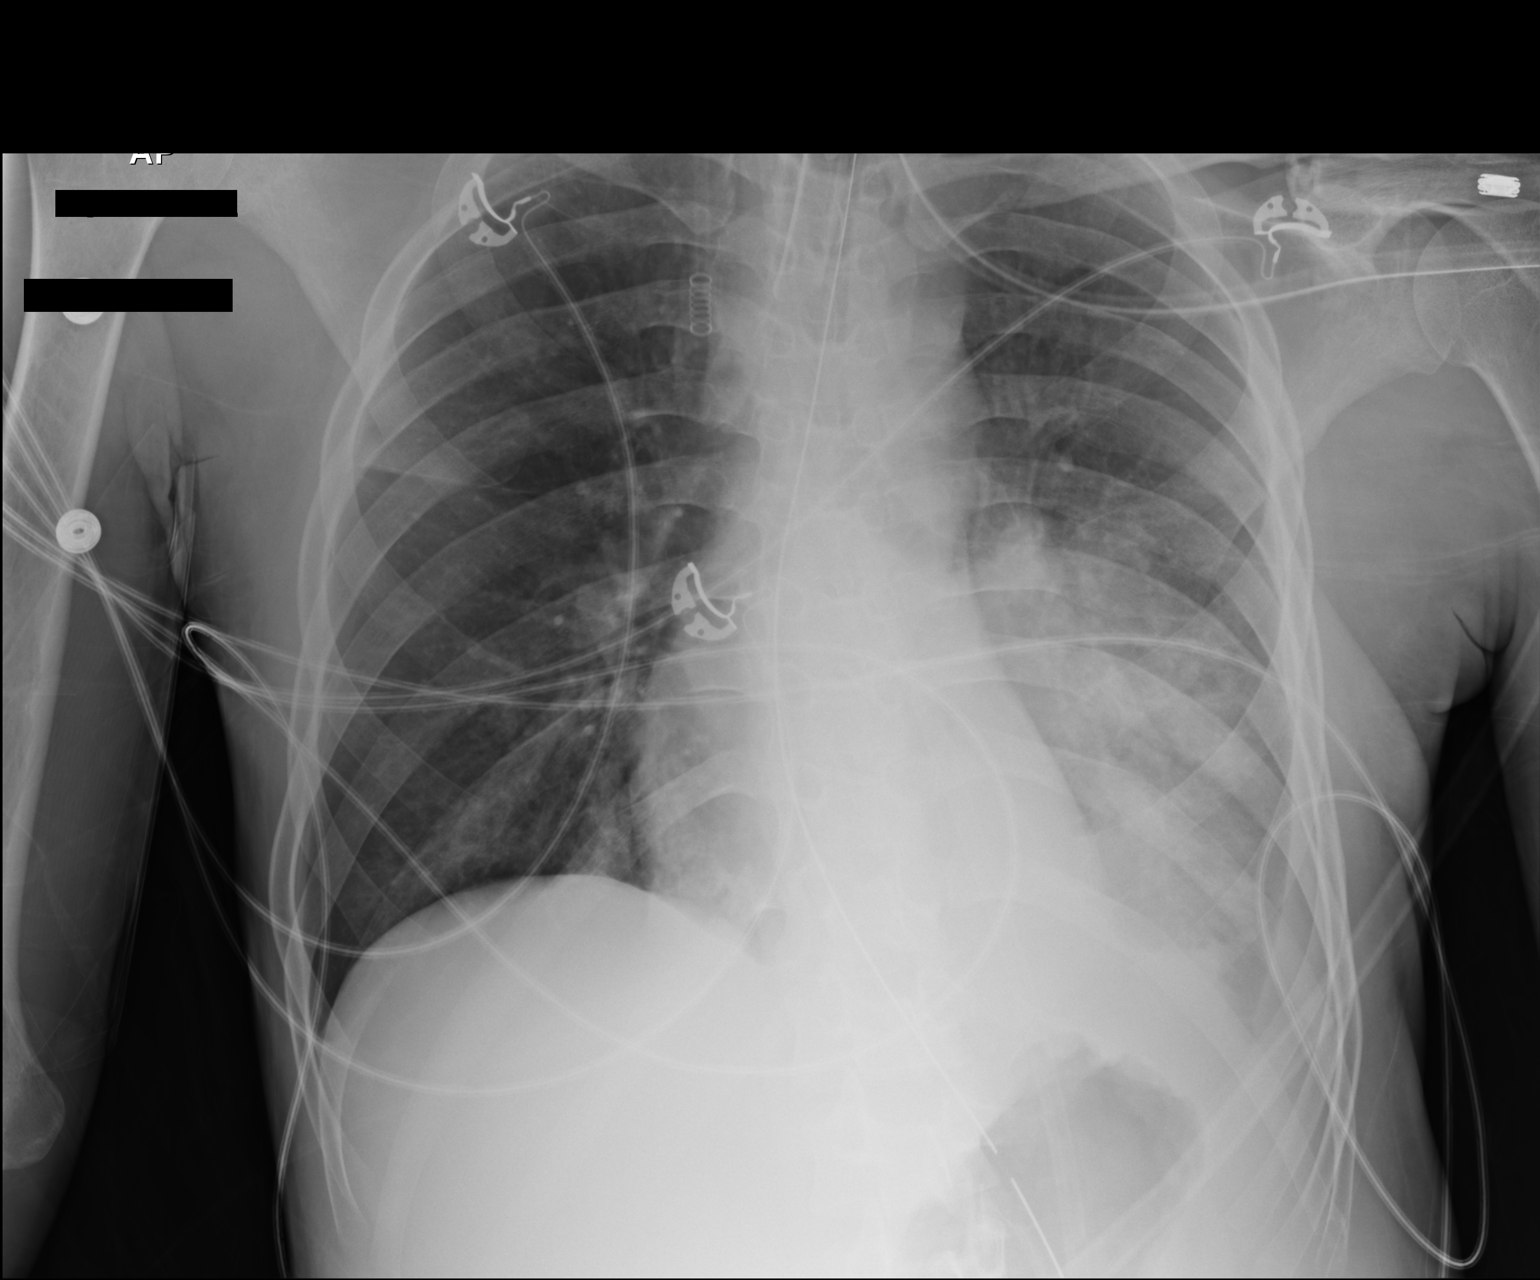

[1 of 1 positions shown; findings below may reference images not displayed]

FINDINGS: Interval placement of endotracheal tube with tip measuring 4.9 cm
above the carinal. Enteric tube was placed. Tip is off the field of
view but below the left hemidiaphragm. Heart size and pulmonary
vascularity are normal. There is developing small left pleural
effusion with consolidation in the left lung base suggesting
pneumonia.
IMPRESSION: Appliances appear in satisfactory location. Increasing consolidation
and effusion in the left lung base.

## 2016-10-22 IMAGING — CR DG CHEST 1V PORT
1 series · 1 of 1 positions shown · non-contrast
Comparison: 08/13/2014

CLINICAL DATA: Respiratory failure.

EXAM:
PORTABLE CHEST - 1 VIEW

[AP]
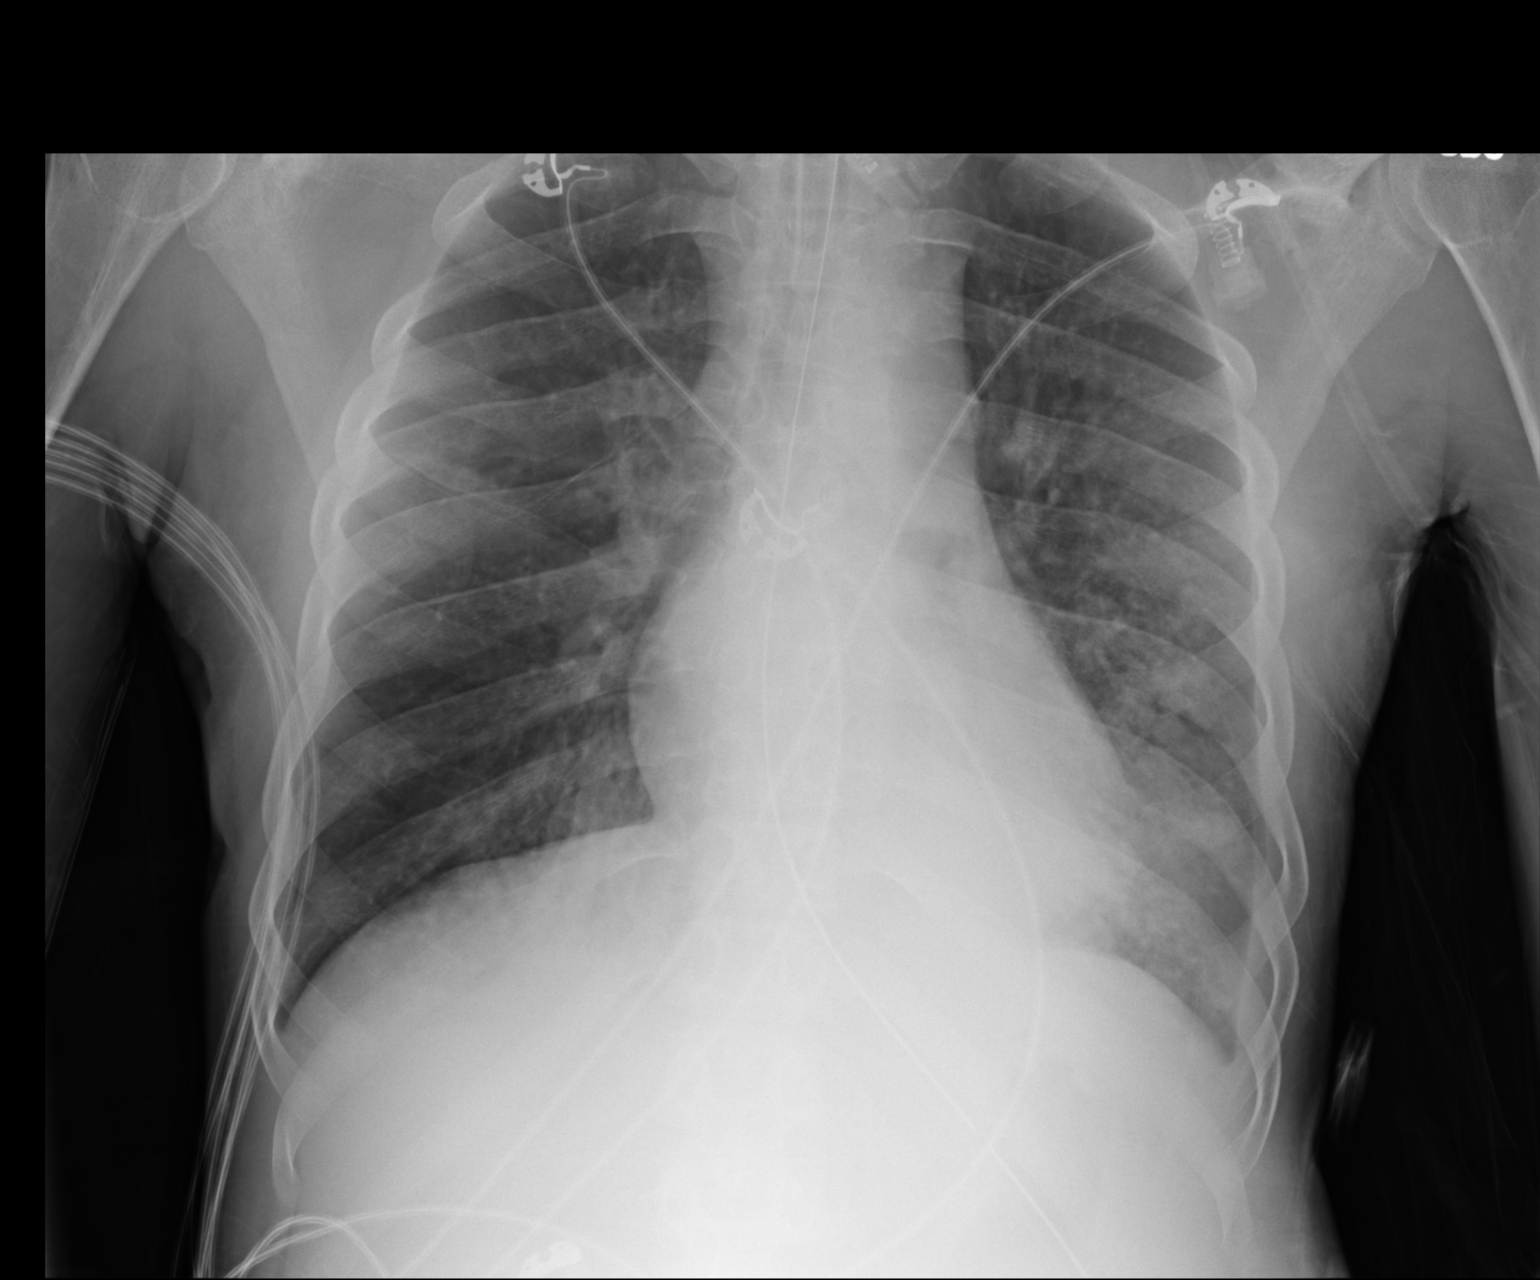

[1 of 1 positions shown; findings below may reference images not displayed]

FINDINGS: Endotracheal tube tip between the clavicular heads and carina.
Gastric suction tube reaches the stomach.

Airspace disease in the left lower lobe is again noted, still
extensive but less dense. Small left pleural effusion. Medial right
basilar and right upper lobe (near the minor fissure) airspace
disease again seen. No evidence of cavitation or pneumothorax.
IMPRESSION: 1. Bilateral pneumonia and small left pleural effusion. Aeration in
the left lower lobe has improved since yesterday.
2. Endotracheal and orogastric tubes remain in good position.

## 2016-10-23 LAB — FUNGUS CULTURE WITH STAIN

## 2016-10-23 LAB — FUNGAL ORGANISM REFLEX

## 2016-10-23 LAB — FUNGUS CULTURE RESULT

## 2016-10-25 IMAGING — MR MR HEAD WO/W CM
11 of 13 series · 26 of 48 positions shown · IV contrast (multihance)
Comparison: Head CT 07/29/2014.  MRI 01/29/2014.

CLINICAL DATA: Quadriplegic secondary to cord infarction. Confusion
with fevers and sepsis. Assess for CNS infection.

EXAM:
MRI HEAD WITHOUT AND WITH CONTRAST
TECHNIQUE: Multiplanar, multiecho pulse sequences of the brain and surrounding
structures were obtained without and with intravenous contrast.
CONTRAST:  10mL MULTIHANCE GADOBENATE DIMEGLUMINE 529 MG/ML IV SOLN

[Series 3: DWI · axial · 5.0mm · 1.02mm/px · z∈[-79,+69]mm · 3 of 62 slices shown (1 of 4)]
[im 1/62]
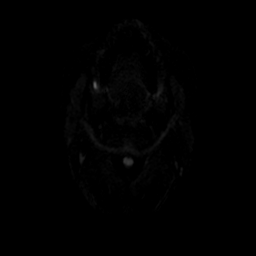
[im 31/62]
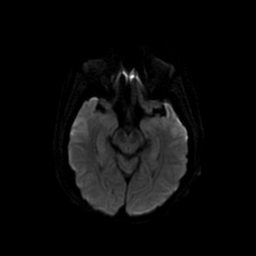
[im 62/62]
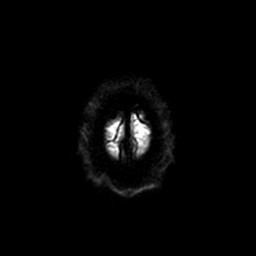

[Series 4: T2 · axial · 5.0mm · 0.43mm/px · z∈[-77,+71]mm · 2 of 26 slices shown (1 of 2)]
[im 1/26]
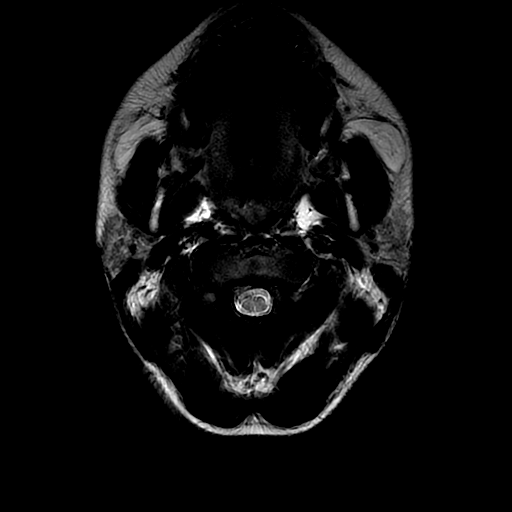
[im 26/26]
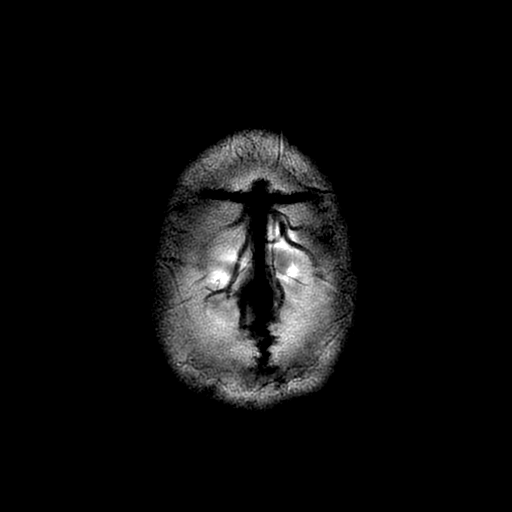

[Series 5: FLAIR · sagittal · 5.0mm · 0.47mm/px · 1 of 21 slices shown (1 of 2)]
[im 1/21]
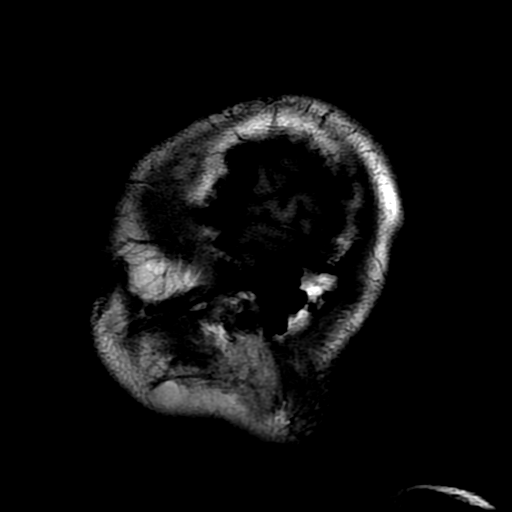

[Series 5: T1 · coronal · 5.0mm · 0.43mm/px · 2 of 28 slices shown (1 of 2)]
[im 1/28]
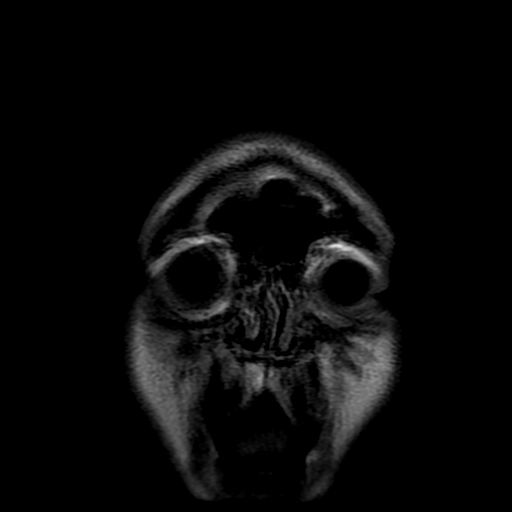
[im 28/28]
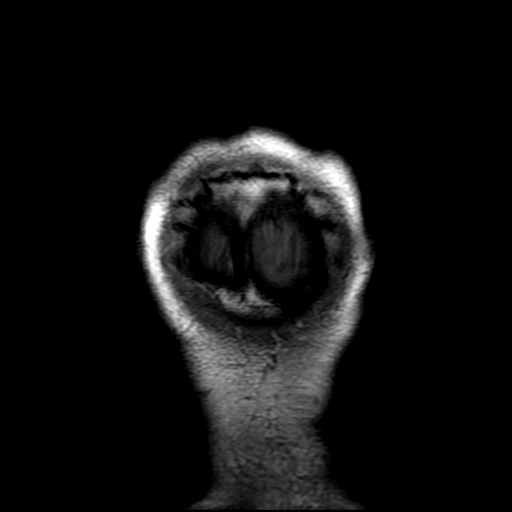

[Series 7: DWI · coronal · 5.0mm · 1.02mm/px · 4 of 64 slices shown (2 of 4)]
[im 1/64]
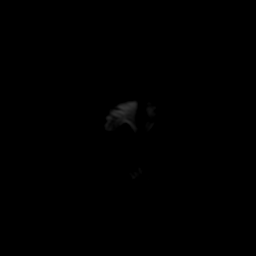
[im 22/64]
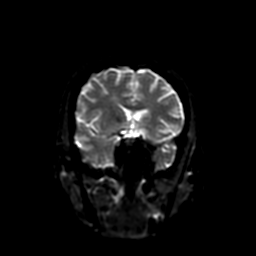
[im 43/64]
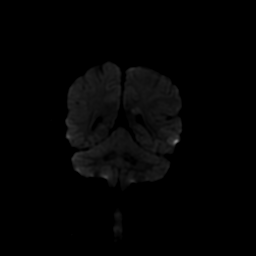
[im 64/64]
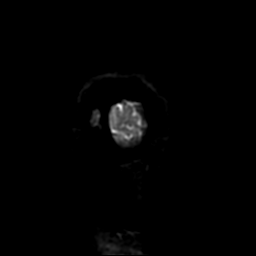

[Series 9: FLAIR · axial · 5.0mm · 0.43mm/px · z∈[-77,+71]mm · 2 of 26 slices shown (2 of 2)]
[im 1/26]
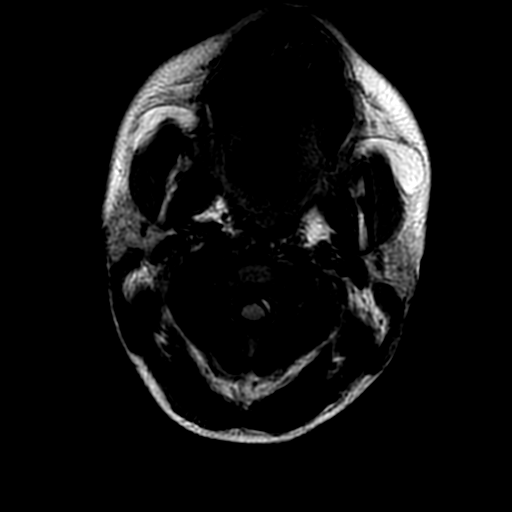
[im 26/26]
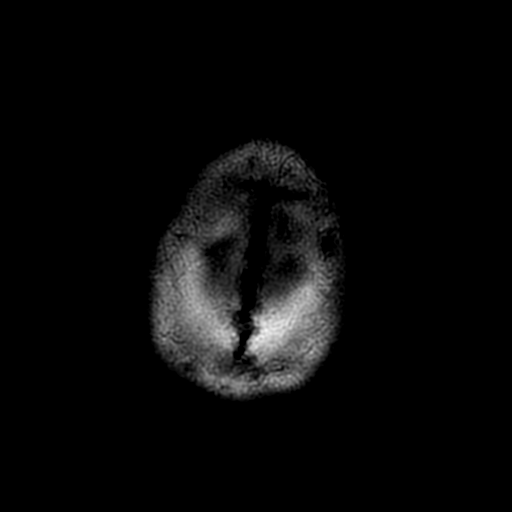

[Series 9: T1 · axial · 5.0mm · 0.43mm/px · 1 of 24 slices shown (2 of 2)]
[im 1/24]
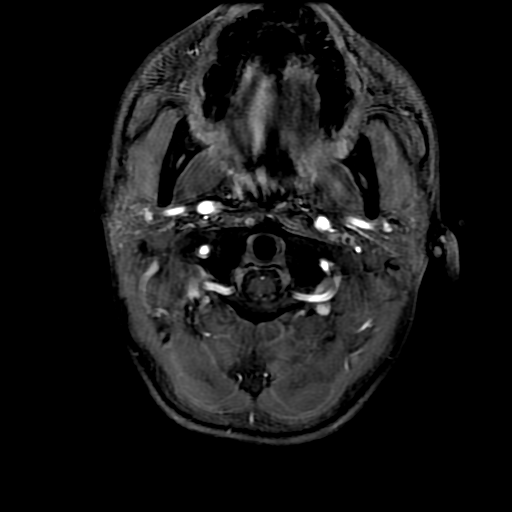

[Series 10: (person_name) · axial · 3.6mm · 0.47mm/px · z∈[-81,+13]mm · 5 of 176 slices shown]
[im 1/176]
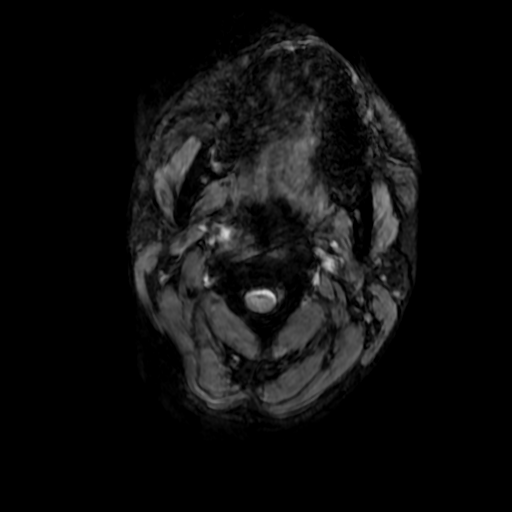
[im 36/176]
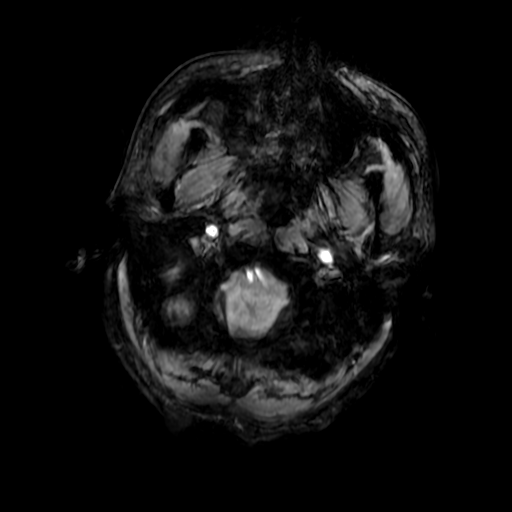
[im 53/176]
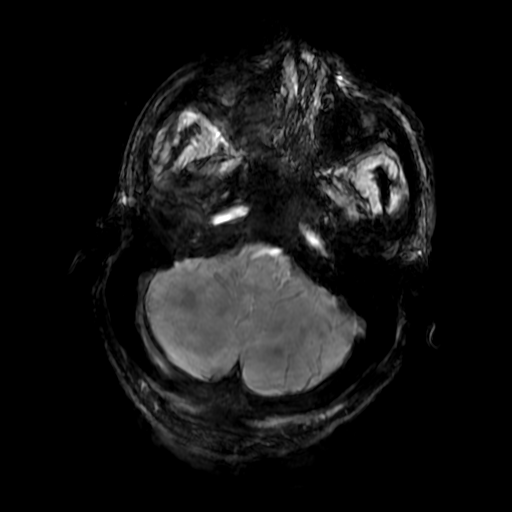
[im 71/176]
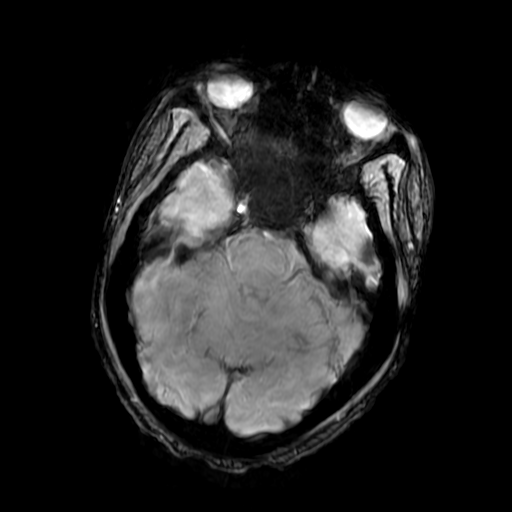
[im 106/176]
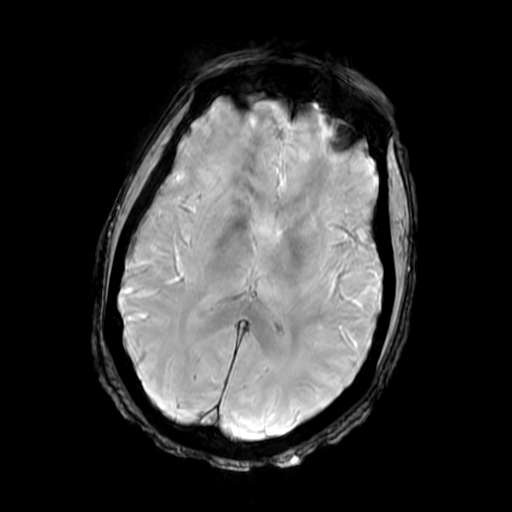

[Series 11: T2 · coronal · 5.0mm · 0.43mm/px · 2 of 27 slices shown (2 of 2)]
[im 1/27]
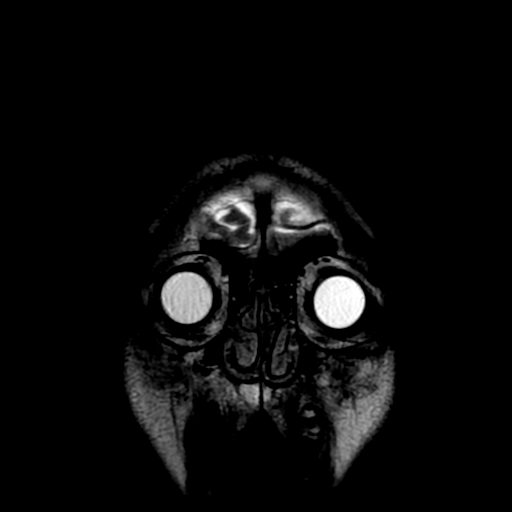
[im 27/27]
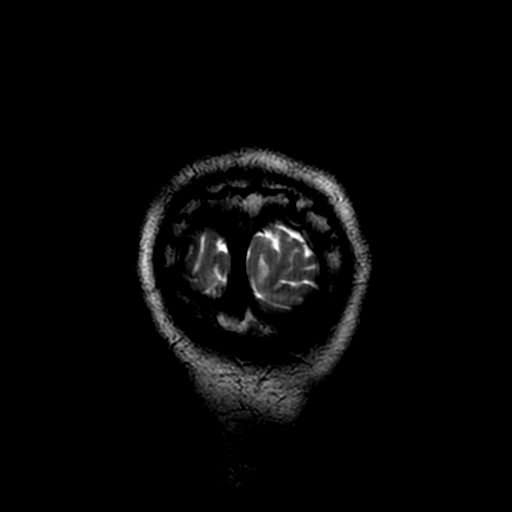

[Series 300: DWI · axial · 5.0mm · 1.02mm/px · z∈[-79,+69]mm · 2 of 31 slices shown (3 of 4)]
[im 1/31]
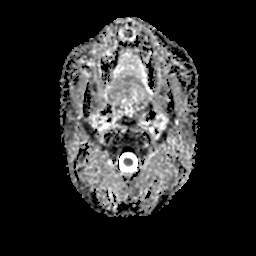
[im 31/31]
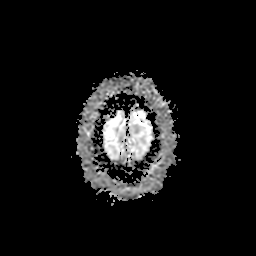

[Series 700: DWI · coronal · 5.0mm · 1.02mm/px · 2 of 32 slices shown (4 of 4)]
[im 1/32]
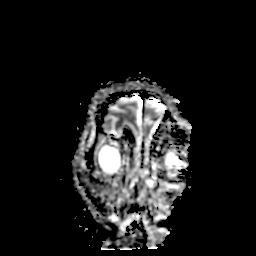
[im 32/32]
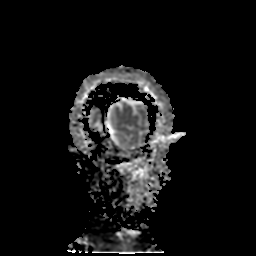

[26 of 48 positions shown; findings below may reference images not displayed]

FINDINGS: The brainstem and cerebellum are normal. The cerebral hemispheres
again show a few foci of chronic T2 signal within the white matter,
possibly related to old shear injuries. Newly seen is edema in the
splenium of the corpus callosum, more on the left than the right.
This is associated with low level restricted diffusion. There is no
contrast enhancement. No other active pathologic finding. After
contrast administration, there is no abnormal enhancement of the
brain or leptomeninges, including of the splenium of the corpus
callosum

No evidence of mass lesion, hemorrhage, hydrocephalus or extra-axial
collection. No pituitary mass. Sinuses are clear.
IMPRESSION: No evidence of intracranial abscess. The patient does have the new
finding of edema signal within the splenium of the corpus callosum.
The differential diagnosis includes the presenting lesion of
multiple sclerosis, demyelination secondary to vitamin B12
deficiency (Hansye disease), infarction due to hypoxia,
and reversible lesions of the splenium associated with various
infectious agents including influenza, rotavirus, E coli, and
adenovirus. Other transient lesions can be caused by seizure,
posterior reversible encephalopathy and hypoglycemia.

## 2016-10-27 IMAGING — CR DG CHEST 2V
2 series · 2 of 2 positions shown · non-contrast
Comparison: 08/14/2014

CLINICAL DATA: Fever, weakness

EXAM:
CHEST  2 VIEW

[w chest lat]
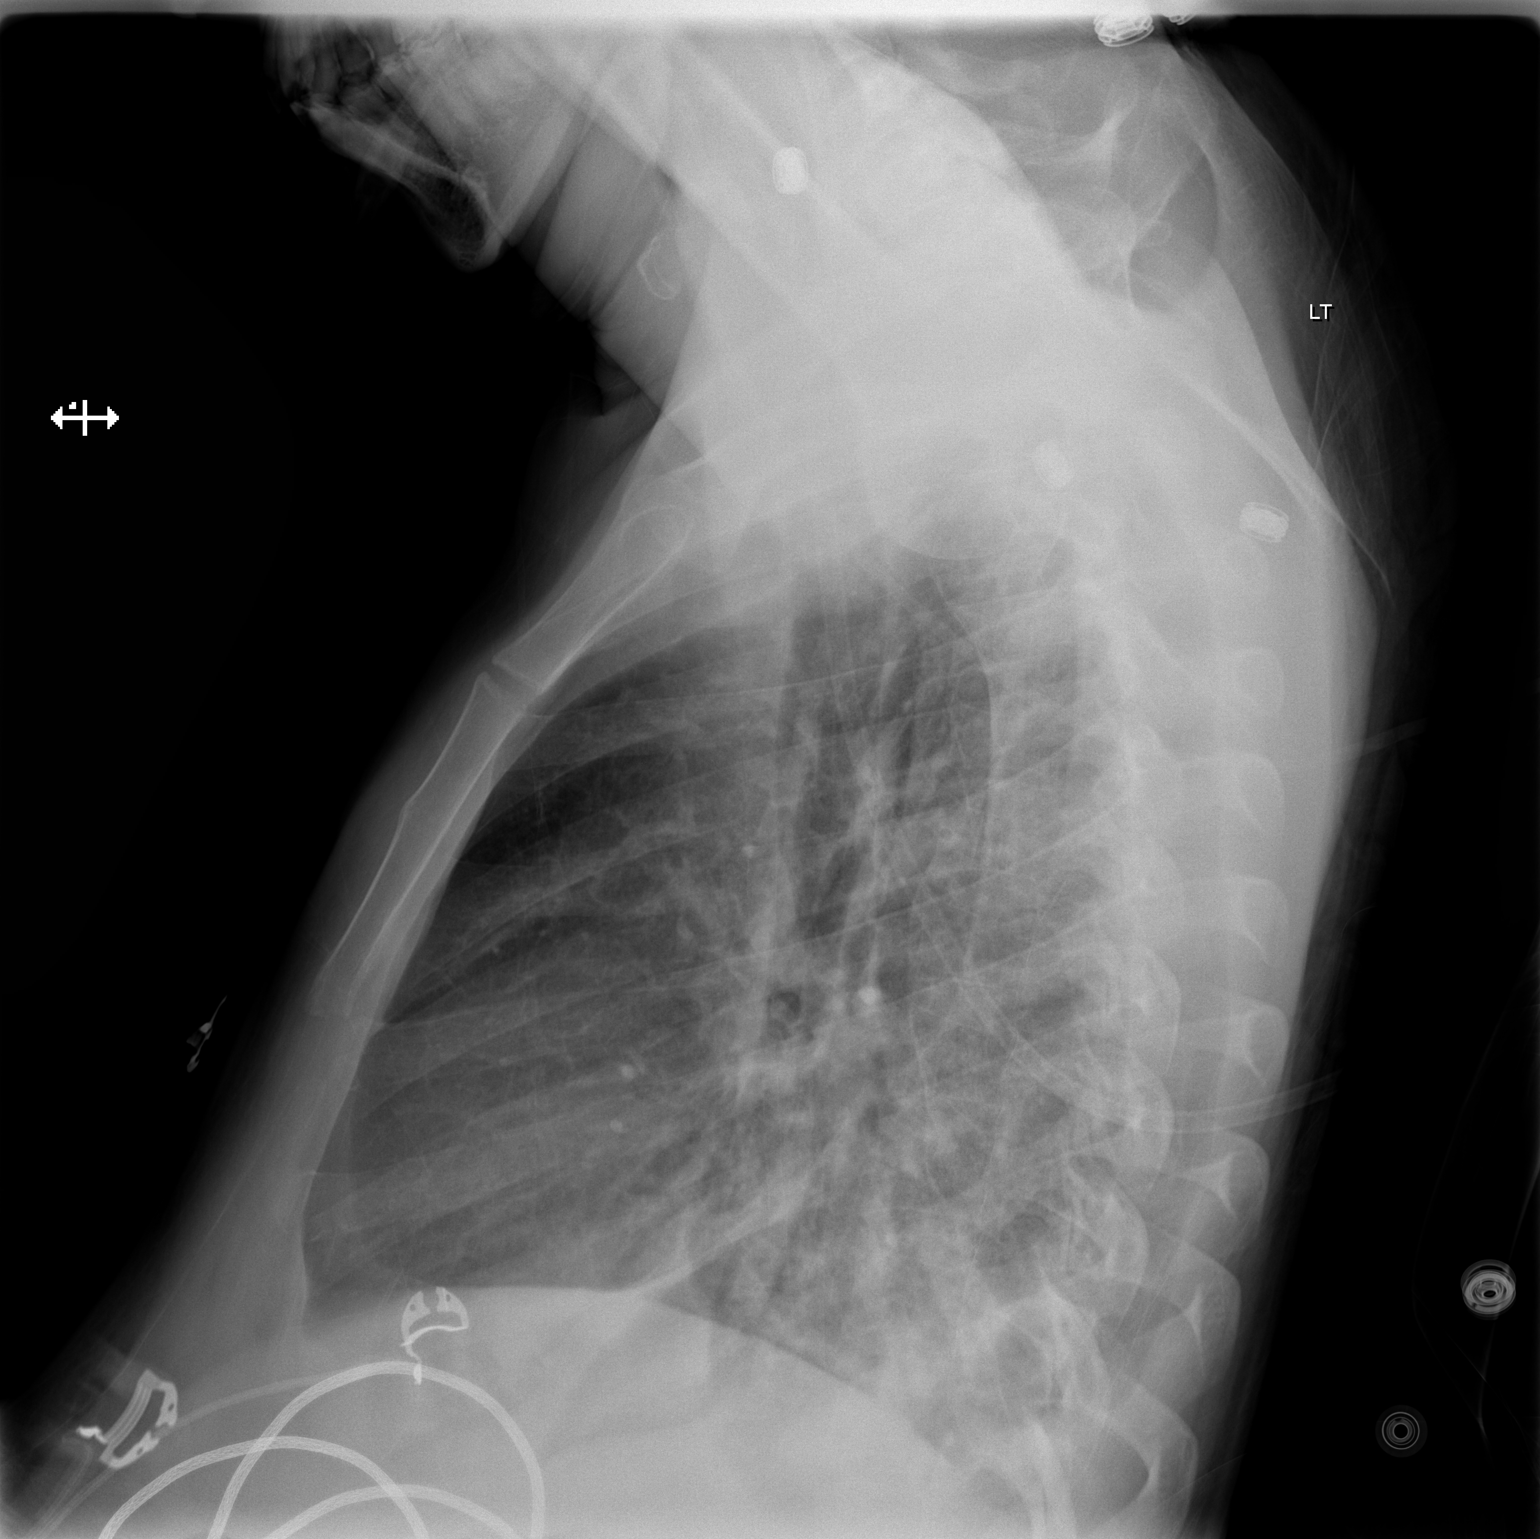

[x chest ap]
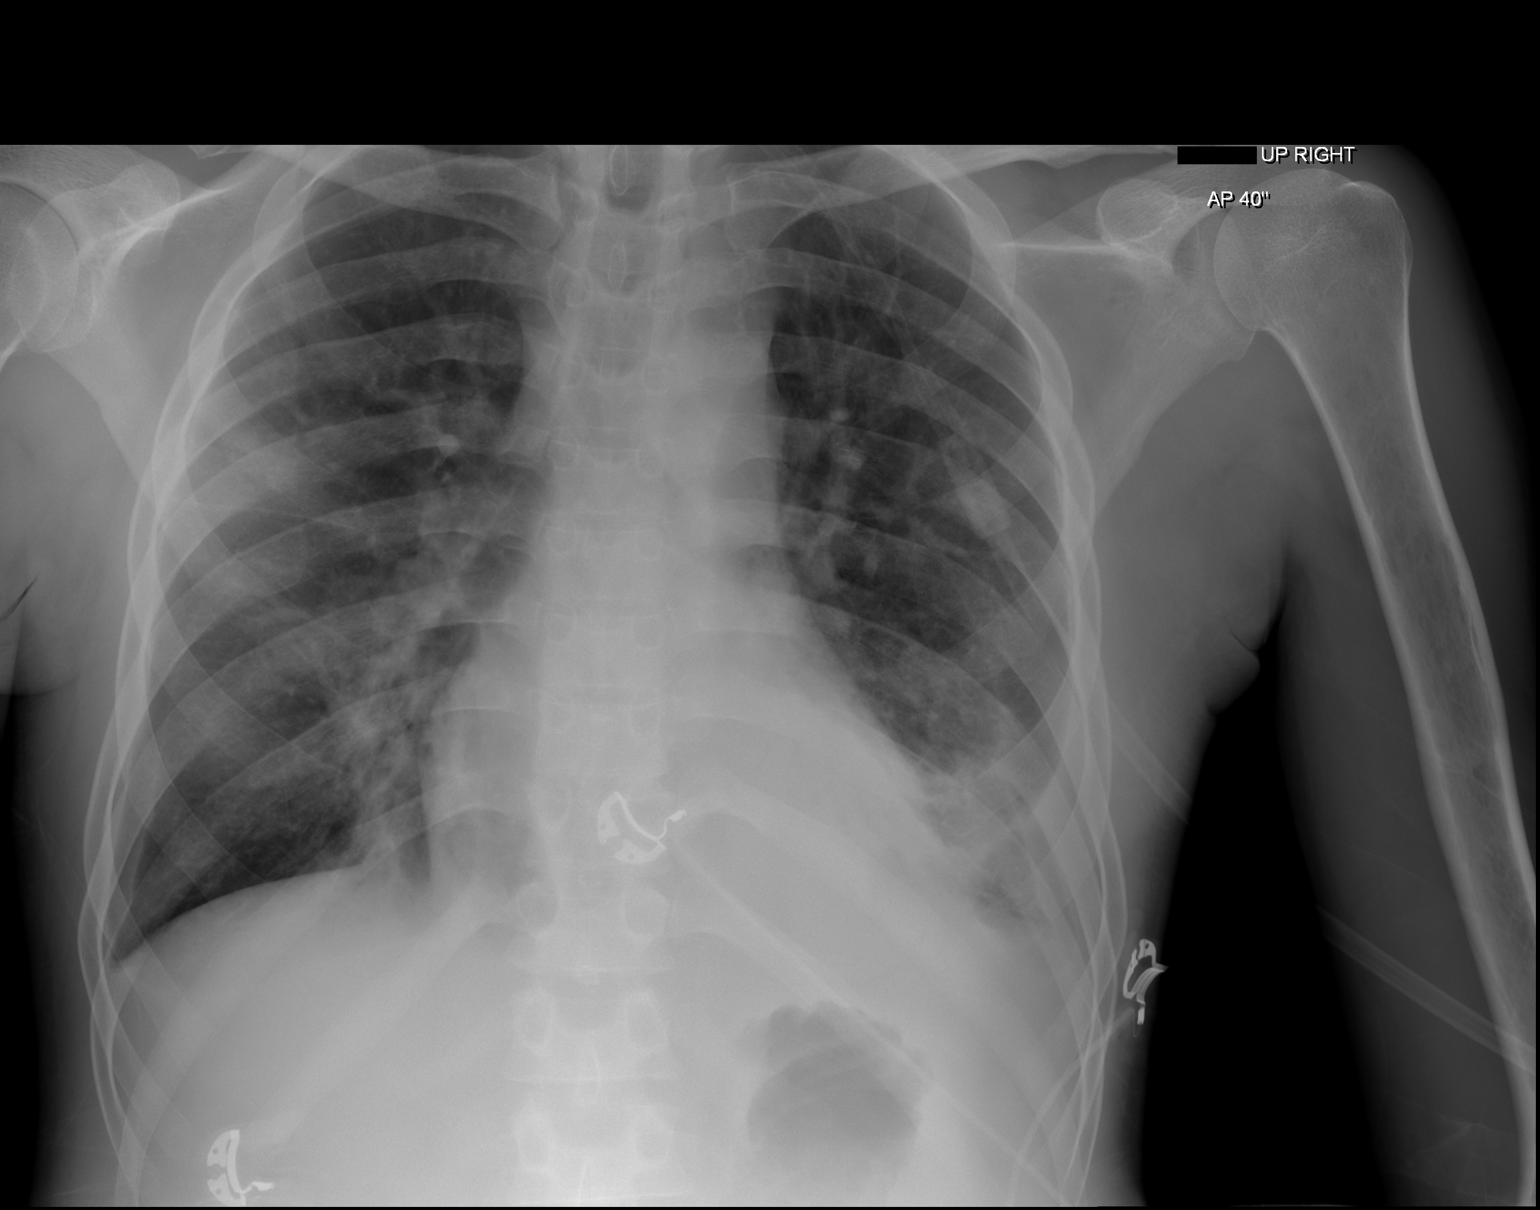

[2 of 2 positions shown; findings below may reference images not displayed]

FINDINGS: Cardiomediastinal silhouette is stable. No pulmonary edema. There is
streaky right middle lobe and left basilar atelectasis or
infiltrate. No pulmonary edema. Question small left pleural
effusion.
IMPRESSION: Streaky right middle lobe and left basilar atelectasis or
infiltrate. Question small left pleural effusion. No pulmonary
edema.

## 2016-11-08 NOTE — Discharge Summary (Signed)
Jason [name], Jason [name]             ACCOUNT NO.:  [id number]  MEDICAL RECORD NO.:  [id number]  LOCATION:  2S05C                        FACILITY:  [facility]  PHYSICIAN:  Providence Lanius, MD  DATE OF BIRTH:  [birth year]  DATE OF ADMISSION:  08/21/2016 DATE OF DISCHARGE:  25-Oct-2016                              DISCHARGE SUMMARY   PRIMARY DIAGNOSIS/CAUSE OF DEATH:  Refractory septic shock.  SECONDARY DIAGNOSES:  Acute on chronic respiratory failure with tracheostomy status, paraplegia, acute encephalopathy, status epilepticus, pontine  infarct, bilateral pleural effusion, tracheostomy status, healthcare-associated pneumonia, recurrent urinary tract infection, Pseudomonas in sputum, leukocytosis, ESBL Klebsiella urinary tract infection, non-anion gap metabolic acidosis, hypokalemia, hypomagnesemia, acute kidney injury, anemia of critical illness, thrombocytopenia, subclavian deep vein thrombosis on the right, severe protein-calorie malnutrition, type 1 diabetes, hypoglycemia, chronic hypotension.  HOSPITAL COURSE:  The patient was a 31 year old quadriplegic male with past medical history significant for poorly-controlled diabetes, resulting in abscess in spinal cord, resulting in quadriplegia, was chronically trached, presented to the hospital with altered mental status, found to be in status epilepticus.  The patient also has a history of multi-drug resistant infections.  The patient developed refractory septic shock, at which point, the patient was not responsive to pressors and steroids, developed acute renal failure, was not a dialysis candidate.  After extensive family meetings with both mother and father, decision was made to proceed with comfort care.  The patient was started on morphine and was disconnected from the ventilator, expired shortly thereafter with family at bedside.     Providence Lanius, MD     WJY/[id number]  D:  10/22/2016  T:  10/22/2016  Job:  [id number]

## 2016-11-24 IMAGING — CR DG ABD PORTABLE 1V
1 series · 1 of 1 positions shown · non-contrast
Comparison: 05/28/2014

CLINICAL DATA: OG tube placement.

EXAM:
PORTABLE ABDOMEN - 1 VIEW

[AP]
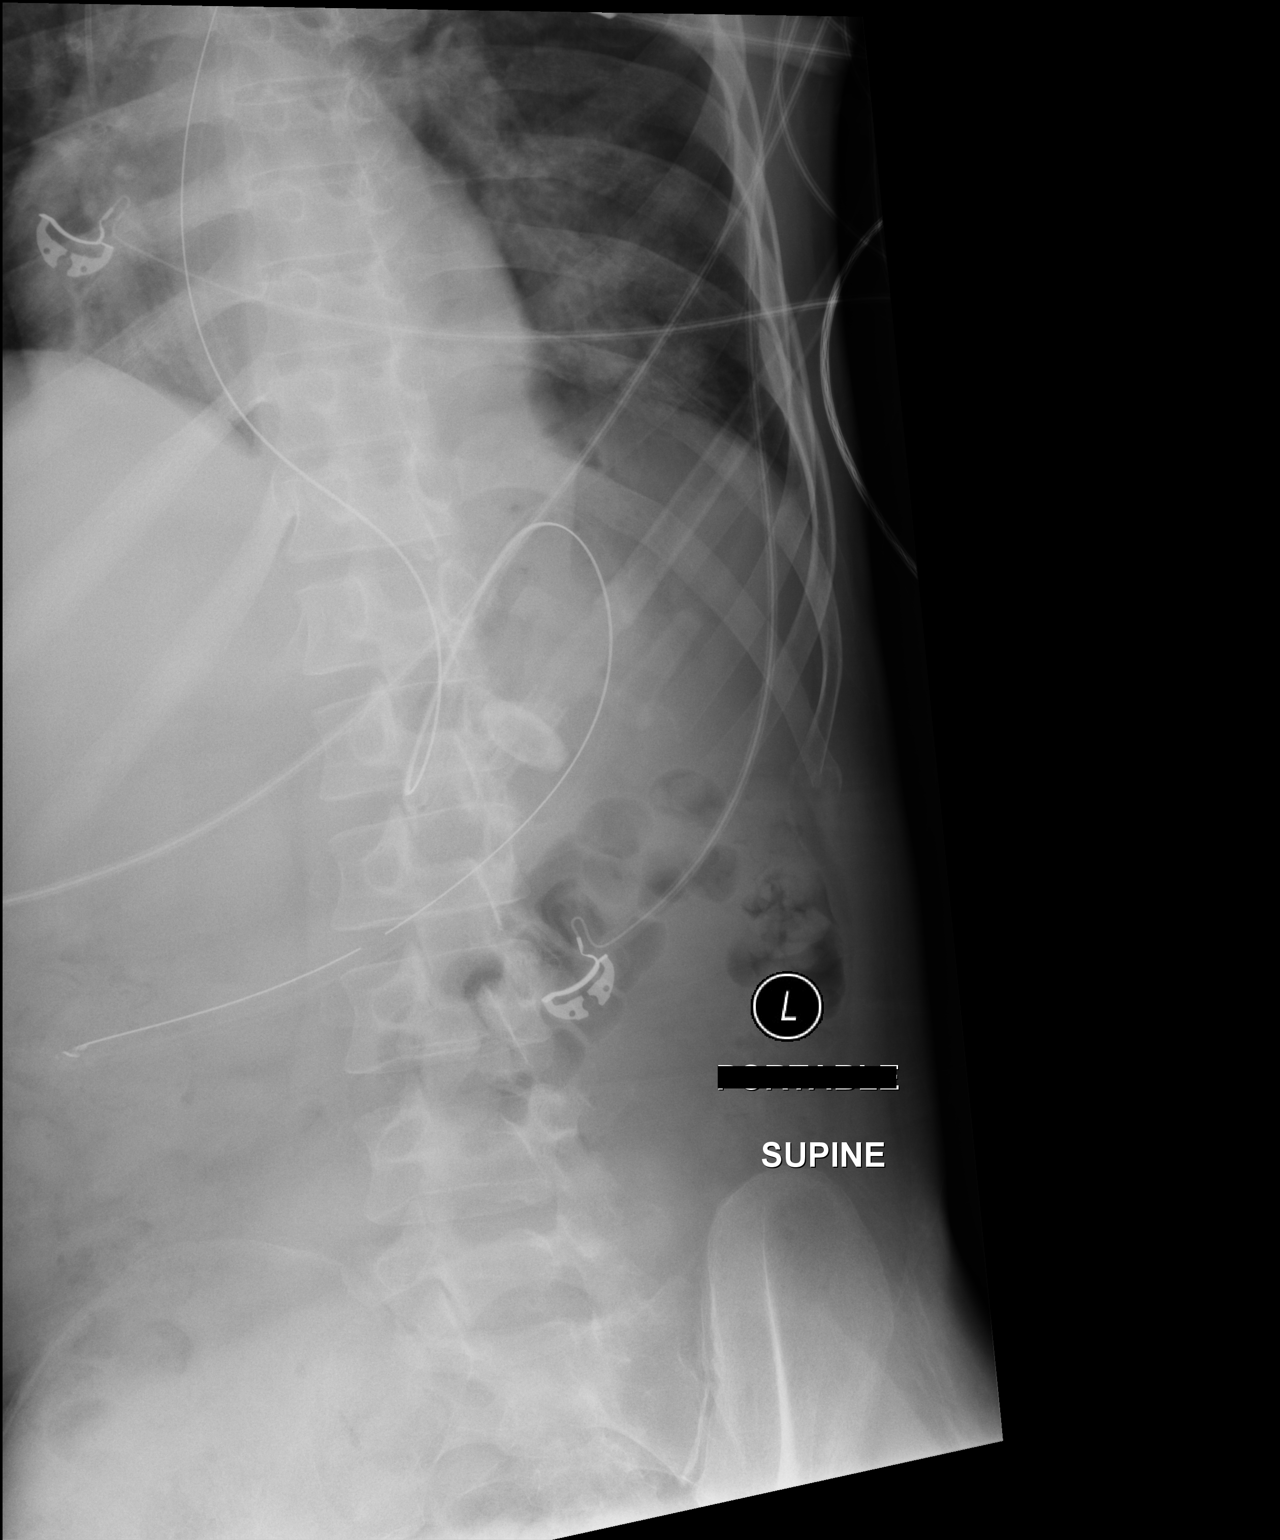

[1 of 1 positions shown; findings below may reference images not displayed]

FINDINGS: Limited field of view abdomen demonstrates enteric tube with tip to
the right of midline consistent with location in the distal stomach.
Additional tubing projected over the left upper quadrant may
represent a percutaneous gastrostomy tube. This could also represent
superficial structure. Visualized bowel gas pattern appears normal.
IMPRESSION: Enteric tube tip localizes over the lower stomach.

## 2016-11-24 IMAGING — MR MR FOOT*L* WO/W CM
5 of 8 series · 28 of 40 positions shown · IV contrast (Yes   MH)
Comparison: None recent.  Radiographs 01/29/2014.

CLINICAL DATA: Osteomyelitis of the foot. Initial encounter.
Multiple foot ulcerations, most profound at the fifth toe.

EXAM:
MRI OF THE LEFT FOREFOOT WITHOUT AND WITH CONTRAST
TECHNIQUE: Multiplanar, multisequence MR imaging was performed both before and
after administration of intravenous contrast.
CONTRAST:  6mL MULTIHANCE GADOBENATE DIMEGLUMINE 529 MG/ML IV SOLN

[Series 5: T1 fat-sat · coronal · 4.0mm · 0.55mm/px · 6 of 46 slices shown (1 of 3)]
[im 1/46]
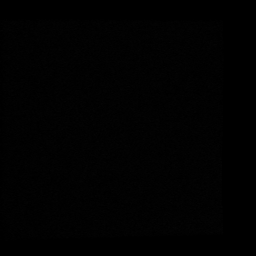
[im 10/46]
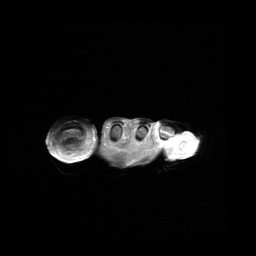
[im 19/46]
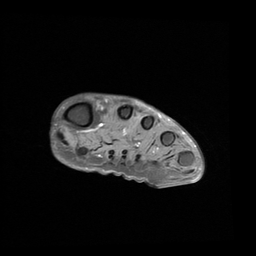
[im 28/46]
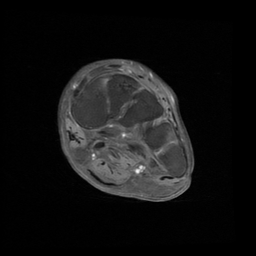
[im 37/46]
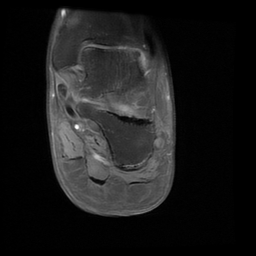
[im 46/46]
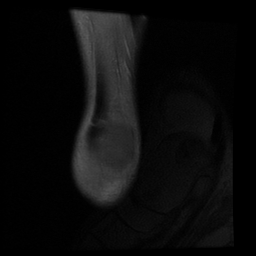

[Series 6: T1 fat-sat · sagittal · 4.0mm · 0.51mm/px · 3 of 19 slices shown (2 of 3)]
[im 1/19]
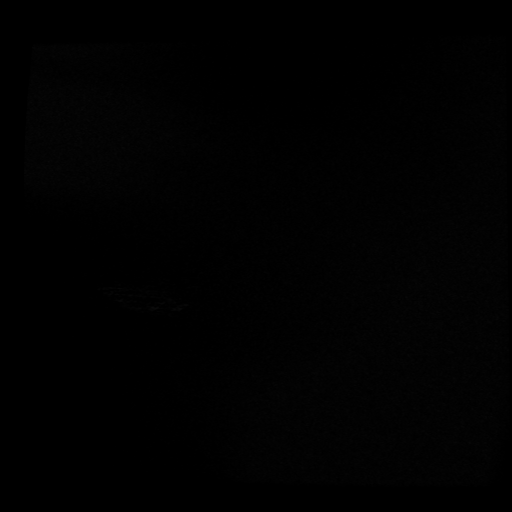
[im 10/19]
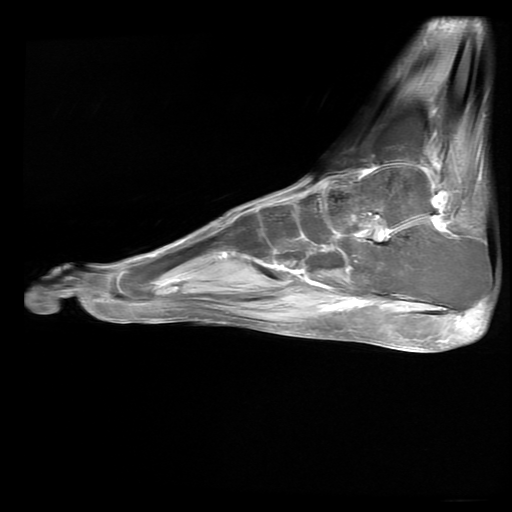
[im 19/19]
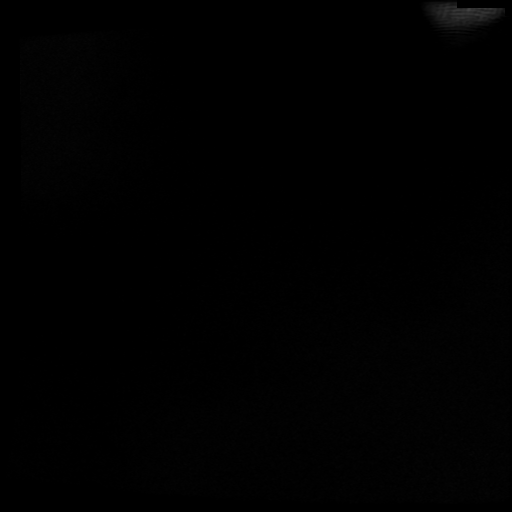

[Series 6: T1 · coronal · 4.0mm · 0.27mm/px · 7 of 47 slices shown]
[im 1/47]
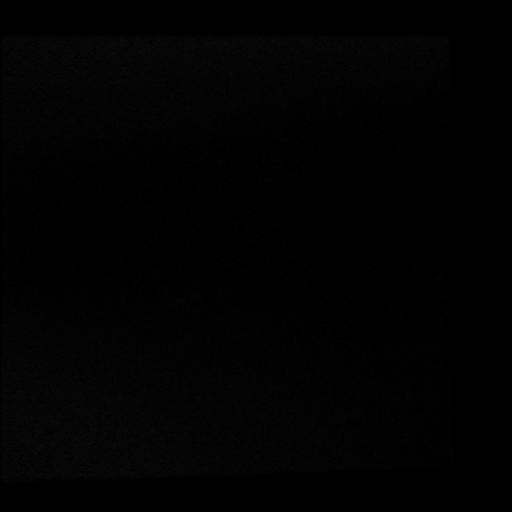
[im 8/47]
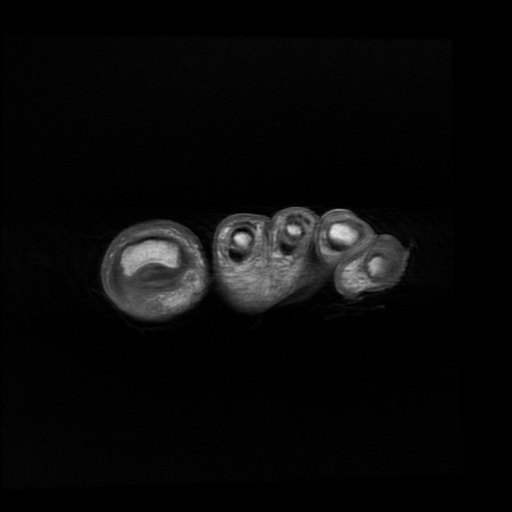
[im 16/47]
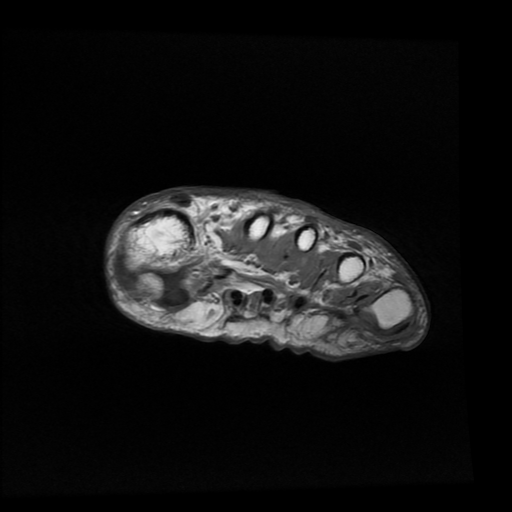
[im 24/47]
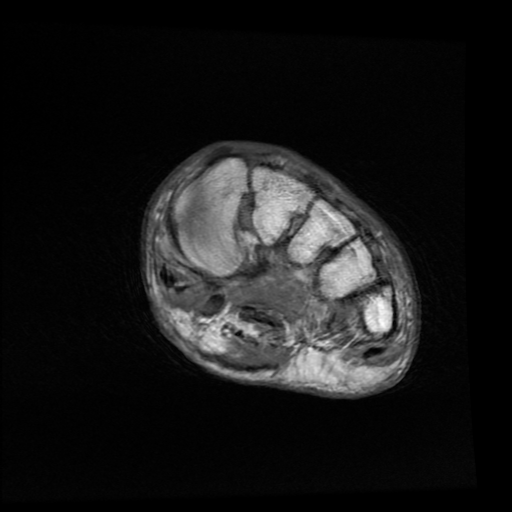
[im 31/47]
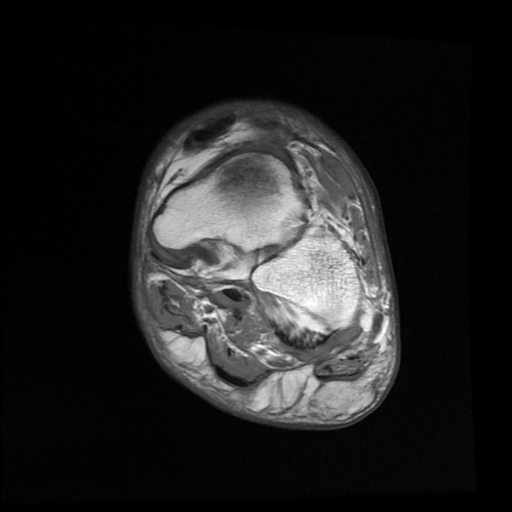
[im 39/47]
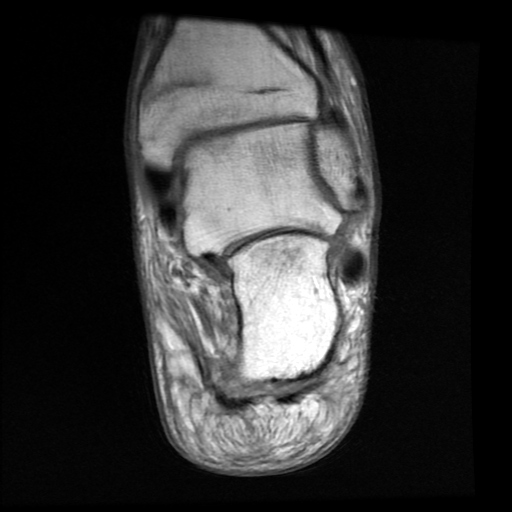
[im 47/47]
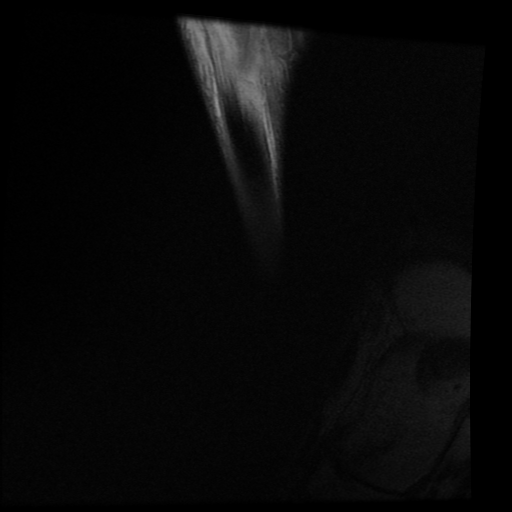

[Series 7: T1 fat-sat · coronal · non-contrast · 4.0mm · 0.27mm/px · 5 of 47 slices shown (3 of 3)]
[im 1/47]
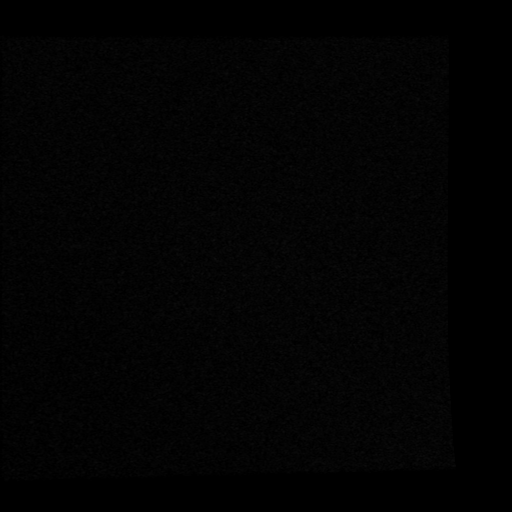
[im 8/47]
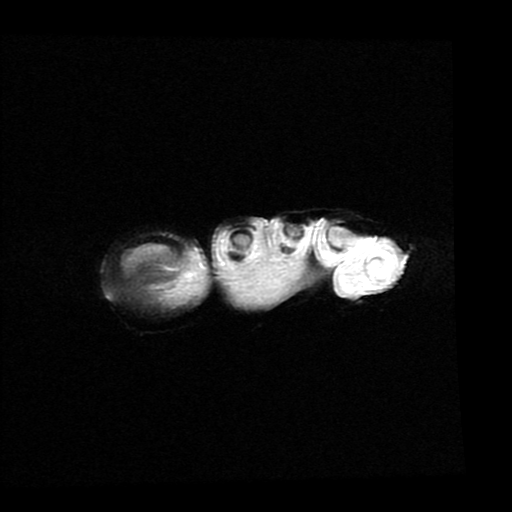
[im 16/47]
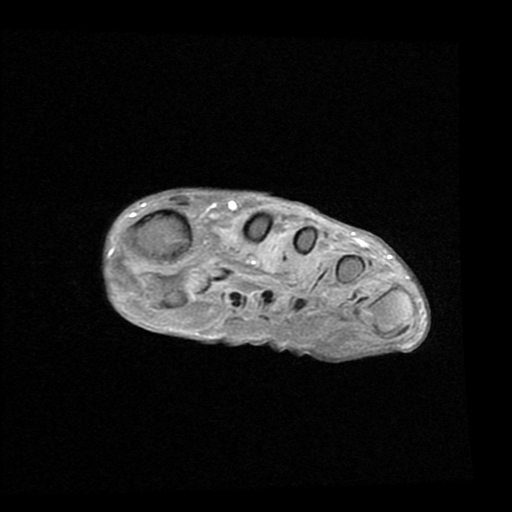
[im 24/47]
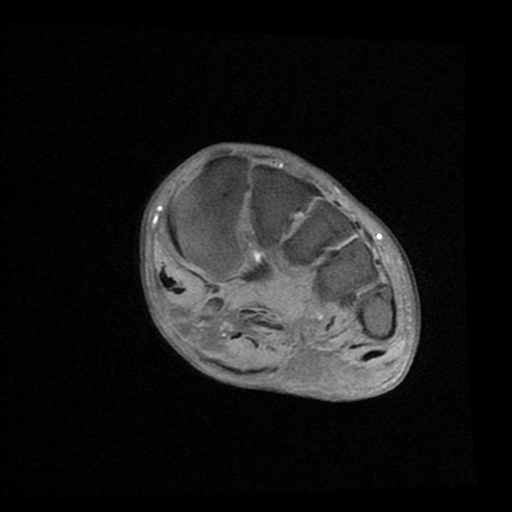
[im 31/47]
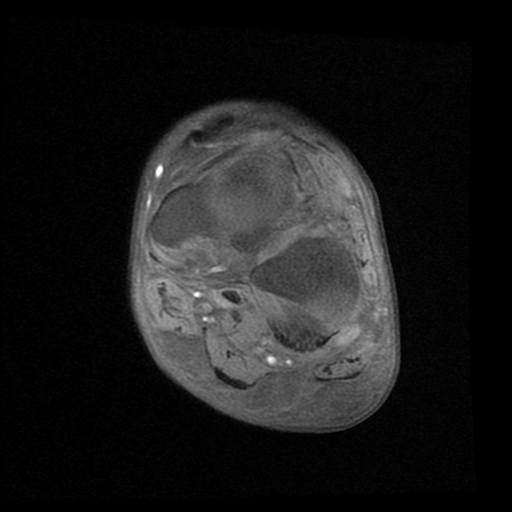

[Series 9: T2 fat-sat · coronal · 4.0mm · 0.55mm/px · 7 of 47 slices shown]
[im 1/47]
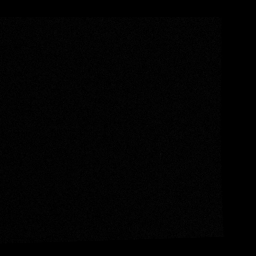
[im 8/47]
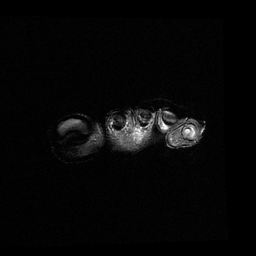
[im 16/47]
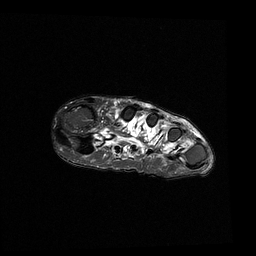
[im 24/47]
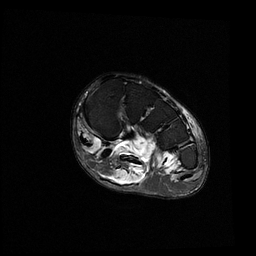
[im 31/47]
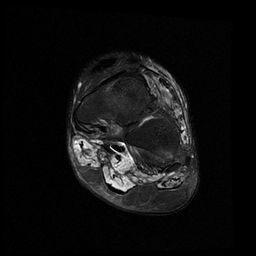
[im 39/47]
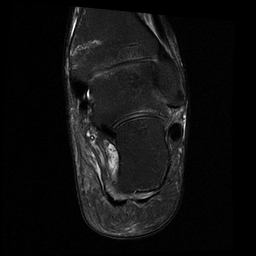
[im 47/47]
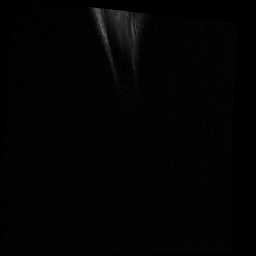

[28 of 40 positions shown; findings below may reference images not displayed]

FINDINGS: There is no effacement of fatty marrow or bone marrow
edema/enhancement to suggest osteomyelitis. There is abnormal marrow
signal in the talar head and neck. The appearance of the talar head
and neck is most compatible with avascular necrosis. In the tibial
plafond, there is enhancing T2 hyperintense line that extends
transversely and is suspicious for an insufficiency fracture however
this may represent an old healed fracture is well.

Phlegmon is present over the calcaneus on post gadolinium images
compatible with soft tissue infection but there is no calcaneal
osteomyelitis. There is loss of fat saturation along the small toe
on the post gadolinium images. Plantar foot musculature shows
diffuse atrophy and edema, likely associated with denervation.
IMPRESSION: 1. No evidence of osteomyelitis of the foot.
2. Phlegmon over the calcaneus, without osteomyelitis of the
calcaneus.
3. Sclerosis of the head and neck of the talus, most compatible with
AVN. This could also be posttraumatic.
4. Incomplete fracture plane through the tibial plafond suspicious
for recent fracture however this may represent residual abnormal
signal from prior fracture.

## 2016-11-24 IMAGING — MR MR PELVIS WO/W CM
4 of 10 series · 18 of 48 positions shown · IV contrast (multihance)
Comparison: 08/17/2014

CLINICAL DATA: Osteomyelitis

EXAM:
MRI PELVIS WITHOUT AND WITH CONTRAST
TECHNIQUE: Multiplanar multisequence MR imaging of the pelvis was performed
both before and after administration of intravenous contrast.
CONTRAST:  6mL MULTIHANCE GADOBENATE DIMEGLUMINE 529 MG/ML IV SOLN

[Series 5: T2 · coronal · 5.0mm · 0.66mm/px · 2 of 25 slices shown]
[im 1/25]
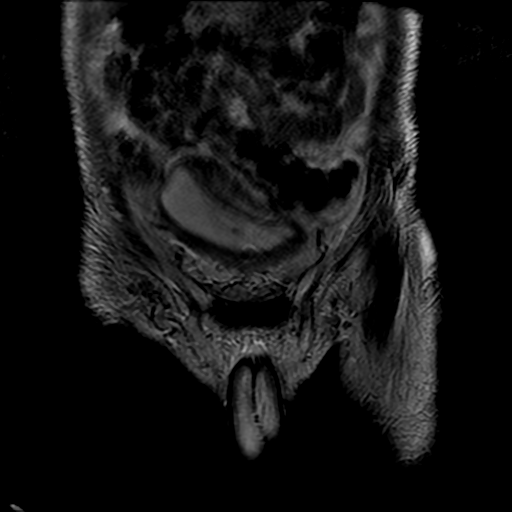
[im 25/25]
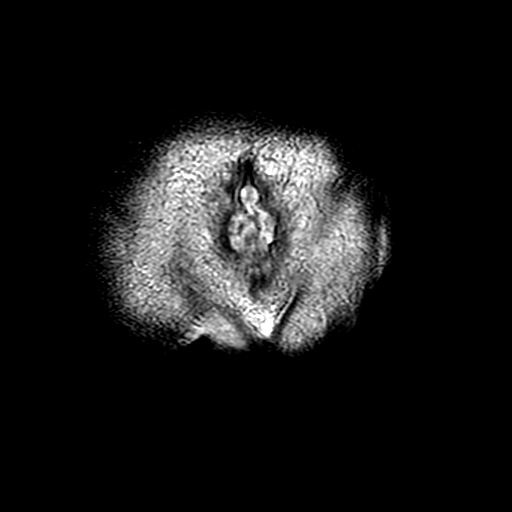

[Series 6: STIR · coronal · 5.0mm · 0.66mm/px · 3 of 25 slices shown]
[im 1/25]
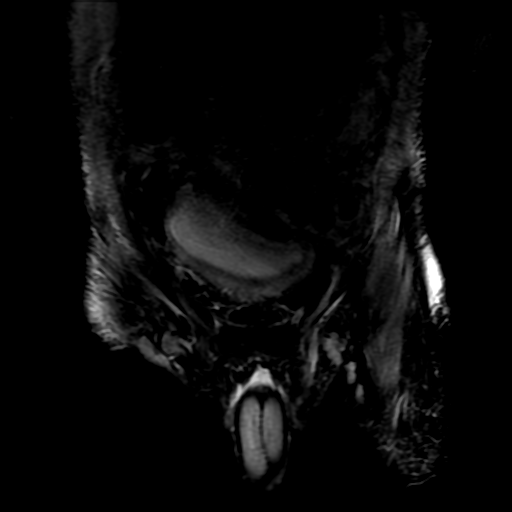
[im 13/25]
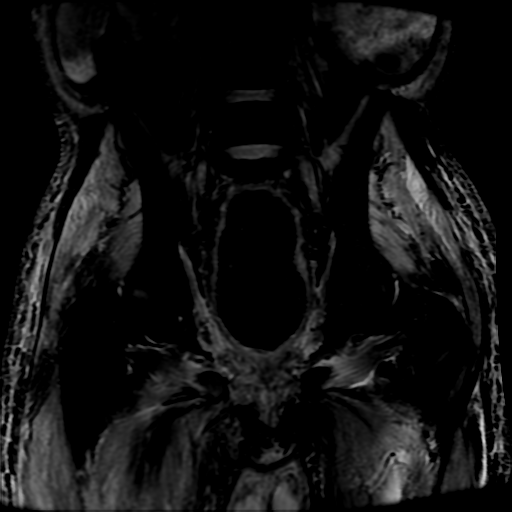
[im 25/25]
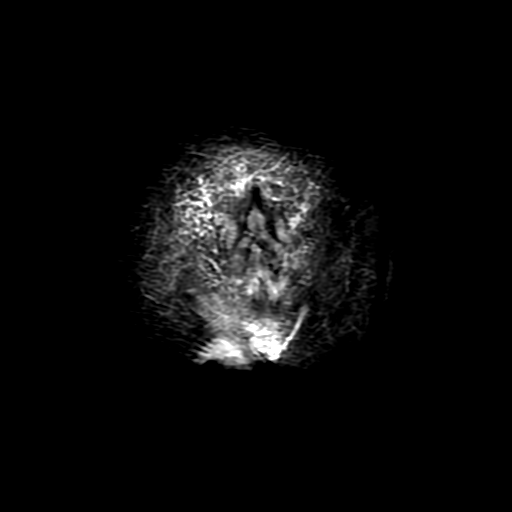

[Series 7: T2 fat-sat · axial · 5.0mm · 0.62mm/px · z∈[-62,+196]mm · 6 of 44 slices shown (1 of 2)]
[im 1/44]
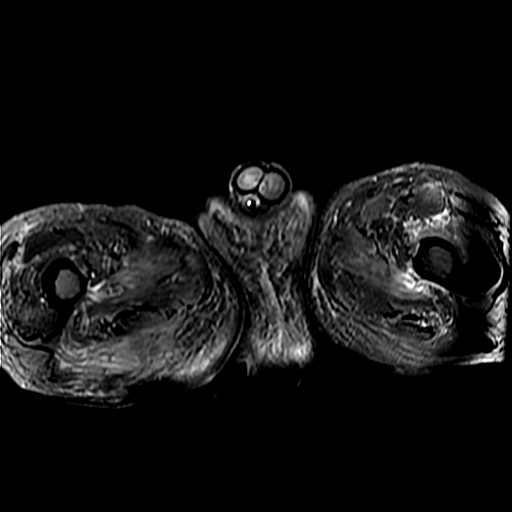
[im 9/44]
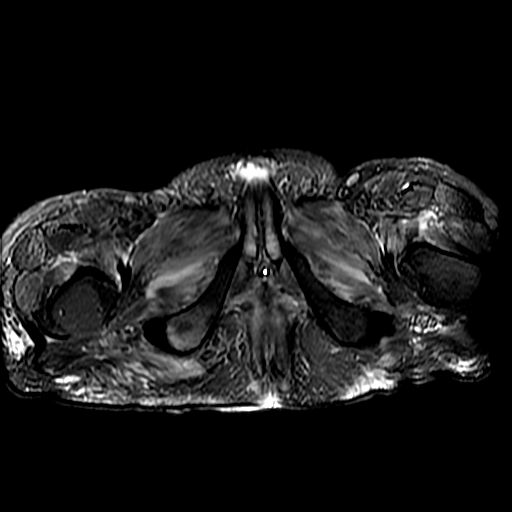
[im 18/44]
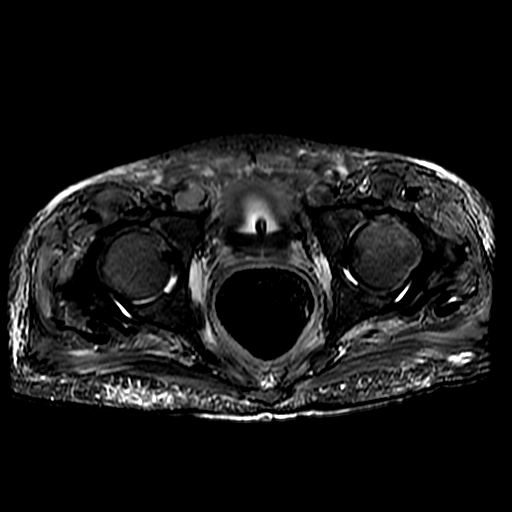
[im 26/44]
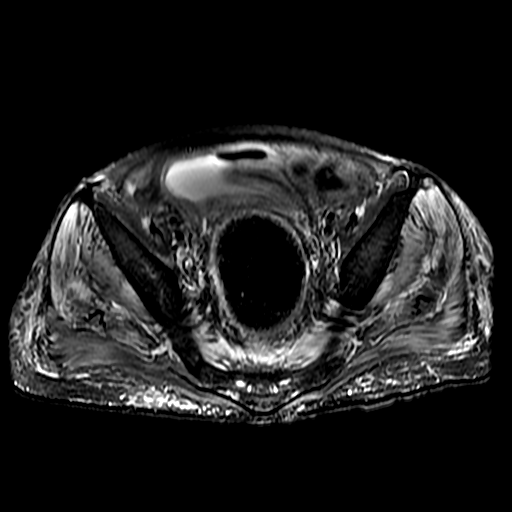
[im 35/44]
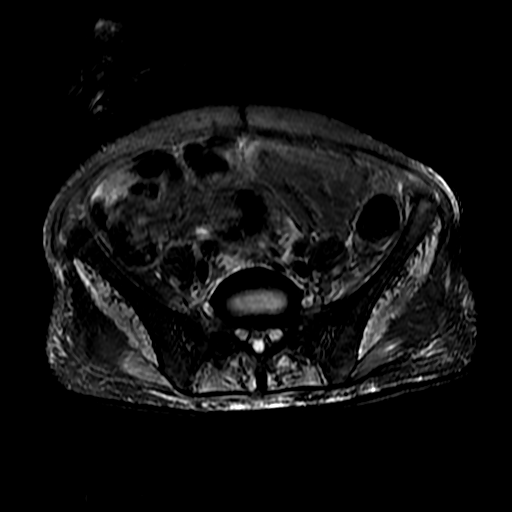
[im 44/44]
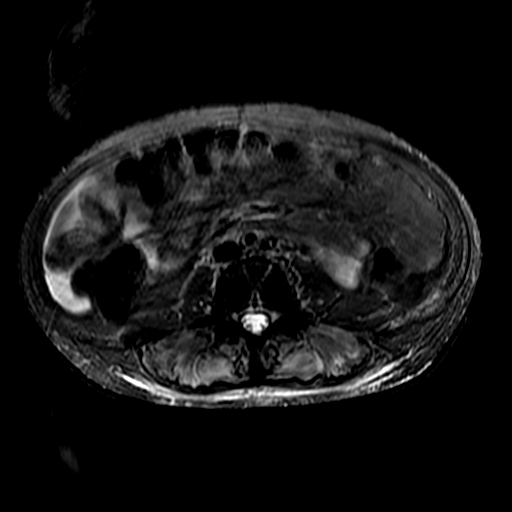

[Series 8: T2 fat-sat · sagittal · 5.0mm · 0.62mm/px · 7 of 56 slices shown (2 of 2)]
[im 1/56]
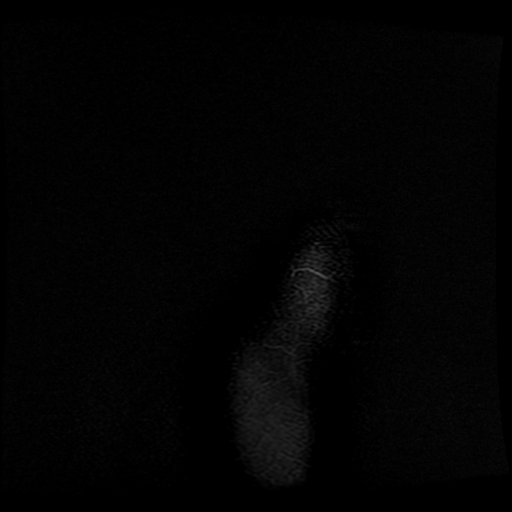
[im 10/56]
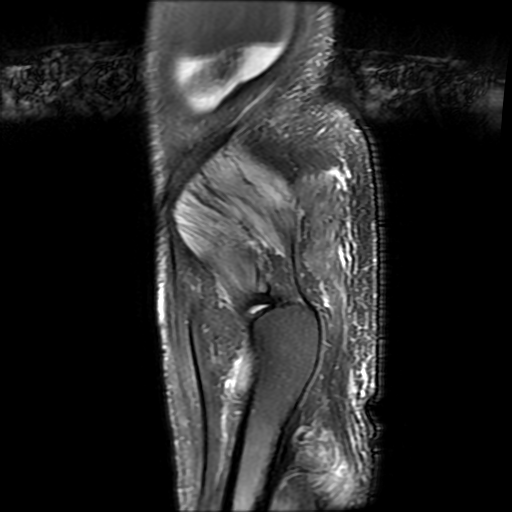
[im 19/56]
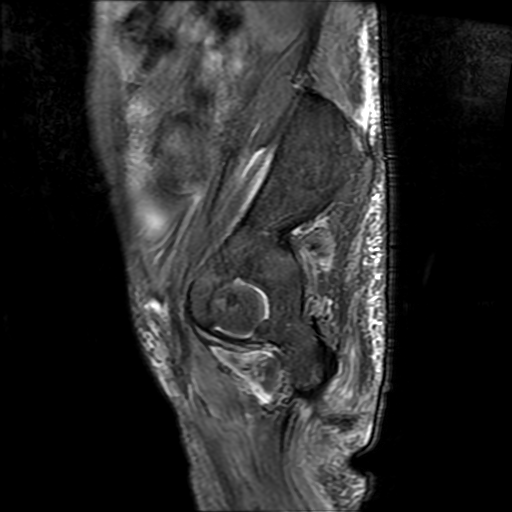
[im 28/56]
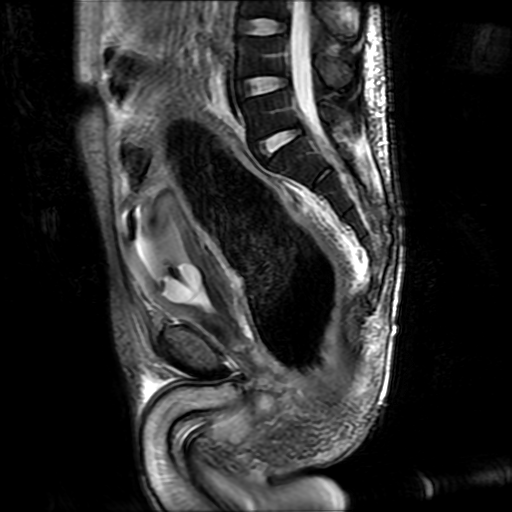
[im 37/56]
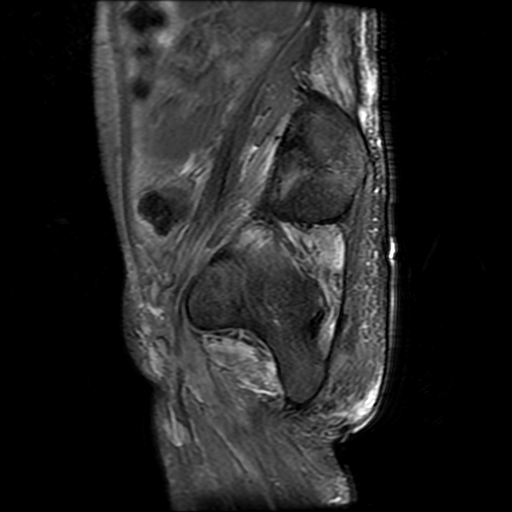
[im 46/56]
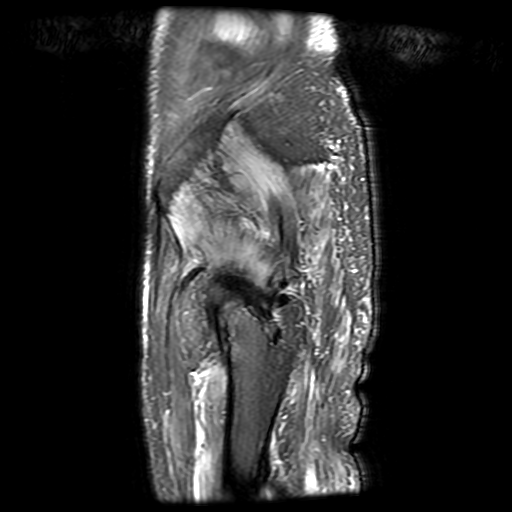
[im 56/56]
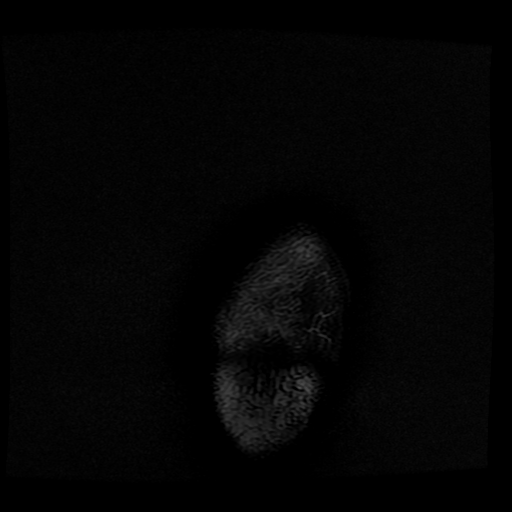

[18 of 48 positions shown; findings below may reference images not displayed]

FINDINGS: There is no focal marrow signal abnormality of bilateral hips. There
is no fracture, dislocation or avascular necrosis. Normal bilateral
sacroiliac joints. No joint effusion. No gross labral or paralabral
abnormality.

Decubitus ulcer overlying the inferior aspect of the right ischial
tuberosity with a small amount of air within the ulcer extending
down to the ischial tuberosity. There is no cortical destruction.
There is mild marrow edema and enhancement of the ischial tuberosity
and extending into the right inferior pubic ramus. There is a 5 x
4.7 x 6 cm complex multiloculated peripherally enhancing fluid
collection extending from the decubitus ulcer down to the ischial
tuberosity and surrounding the ischial tuberosity.

There is diffuse increased T2 signal throughout the pelvic
musculature. There is mild soft tissue edema within the subcutaneous
fat diffusely throughout the pelvis.

There is diffuse bladder wall thickening with a Foley catheter
present.

There is a large amount stool within the rectosigmoid colon.
IMPRESSION: 1. Decubitus ulcer overlying the right ischial tuberosity with a
small amount of air within the ulcer. There is a 5 x 4.7 x 6 cm
complex multiloculated peripherally enhancing fluid collection
excision of a decubitus ulcer down to the ischial tuberosity and
surrounding the ischial tuberosity with underlying mild marrow edema
and enhancement without definite cortical destruction of the ischial
tuberosity extending into the inferior pubic ramus. The complex
fluid collection is enlarged and more loculated compared with the
prior examination most consistent with an abscess. The osseous
changes are mildly increased compared with the prior examination
concerning for osteomyelitis versus reactive changes given the lack
of cortical destruction.
2. Diffuse muscle edema throughout the pelvic musculature. The
appearance is most consistent with nonspecific myositis which may be
secondary to neurogenic or infectious etiology.

## 2016-11-25 IMAGING — CR DG CHEST 1V PORT
1 series · 1 of 1 positions shown · non-contrast
Comparison: 09/16/2014.

CLINICAL DATA: Intubation.

EXAM:
PORTABLE CHEST - 1 VIEW

[portable]
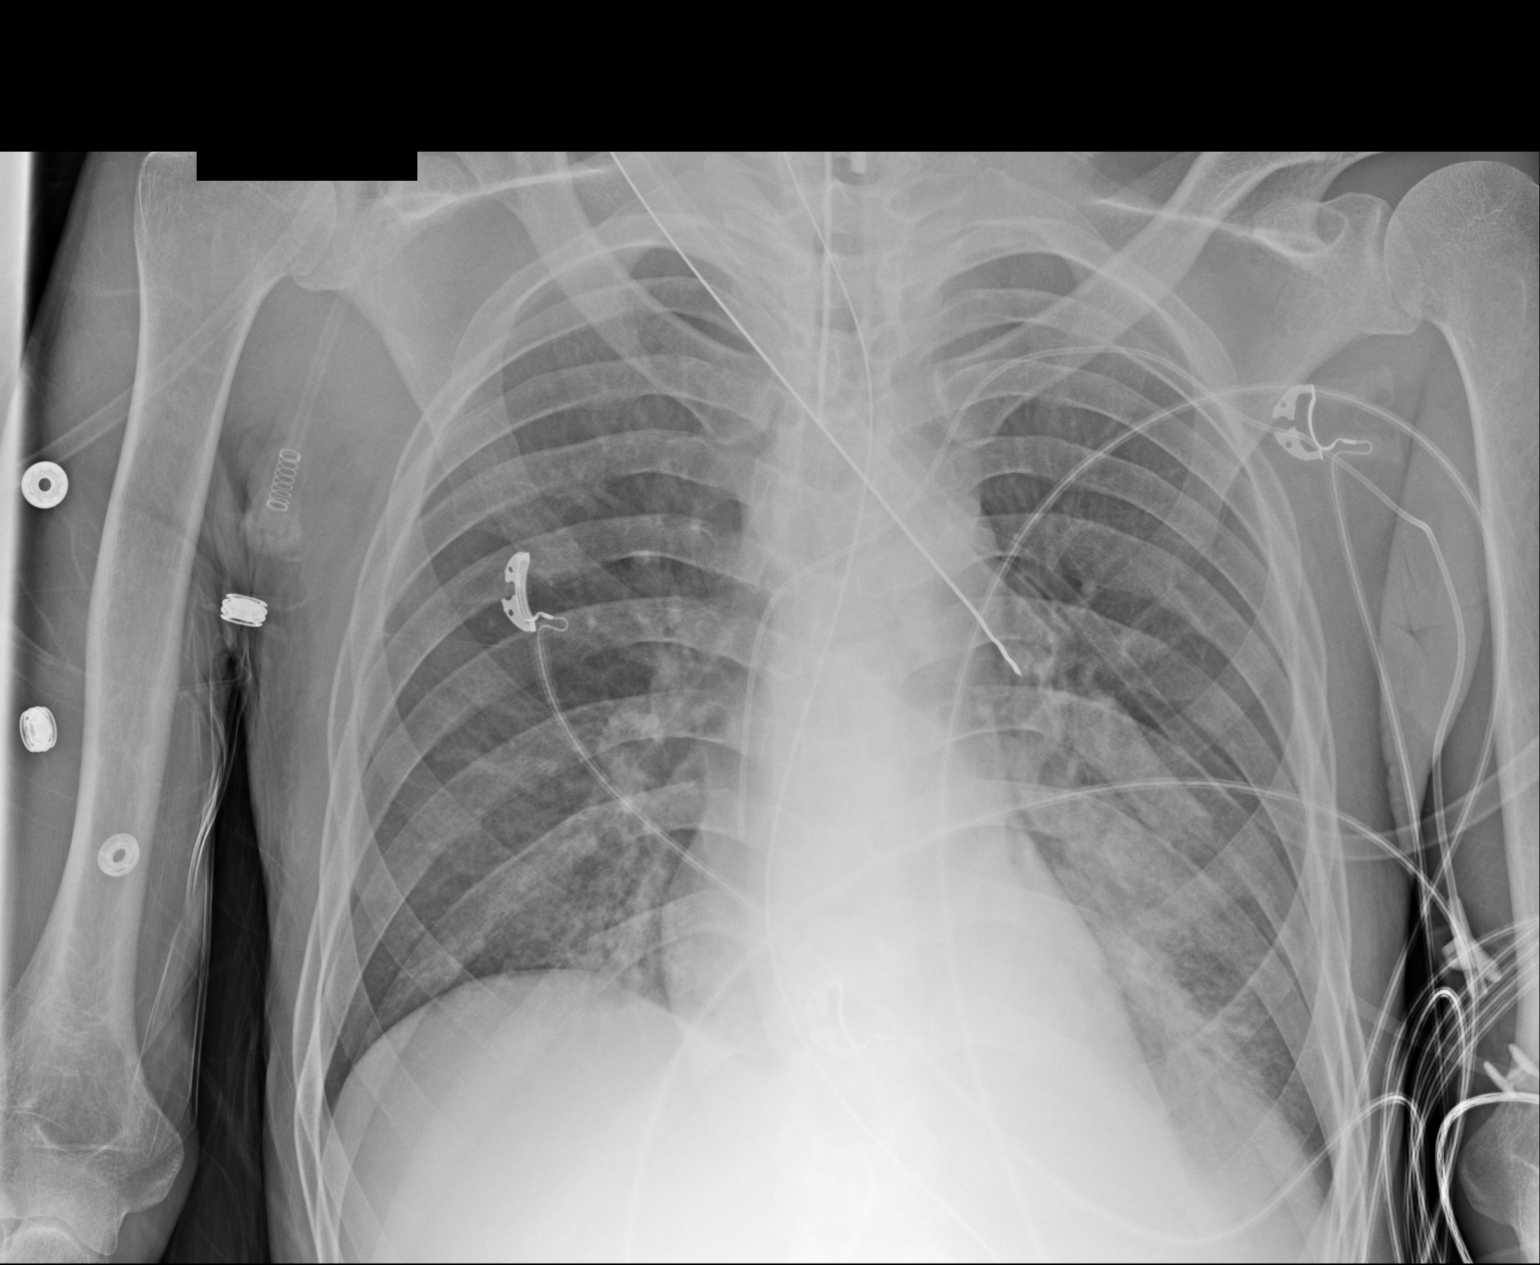

[1 of 1 positions shown; findings below may reference images not displayed]

FINDINGS: Endotracheal tube, left subclavian line, NG tube in stable position.
Progressive left lower lobe infiltrate. No pleural effusion or
pneumothorax. Heart size normal.
IMPRESSION: 1. Lines and tubes in stable position.

2. Progressive left lower lobe infiltrate consistent with pneumonia.

## 2016-11-25 IMAGING — US US ABDOMEN LIMITED
1 series · 14 of 25 positions shown · non-contrast
Comparison: None.

CLINICAL DATA: Elevated LFTs.

EXAM:
US ABDOMEN LIMITED - RIGHT UPPER QUADRANT

[Series 1: us abdomen limited · 0.26mm/px · 14 of 27 slices shown]
[im 1/27]
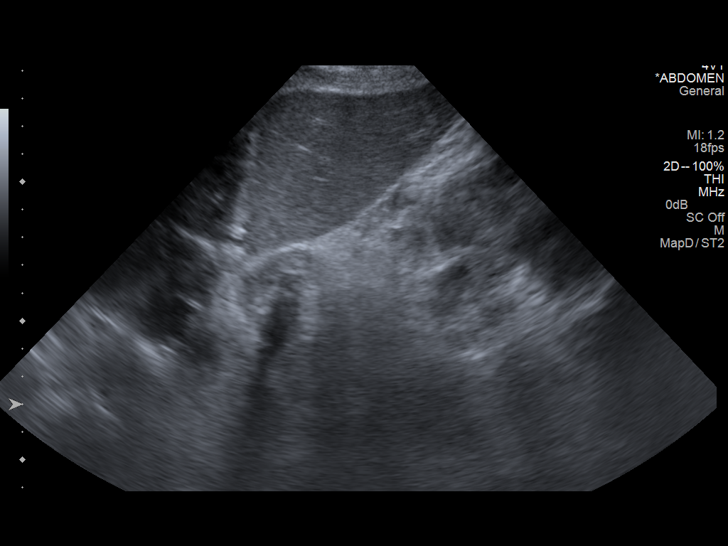
[im 3/27]
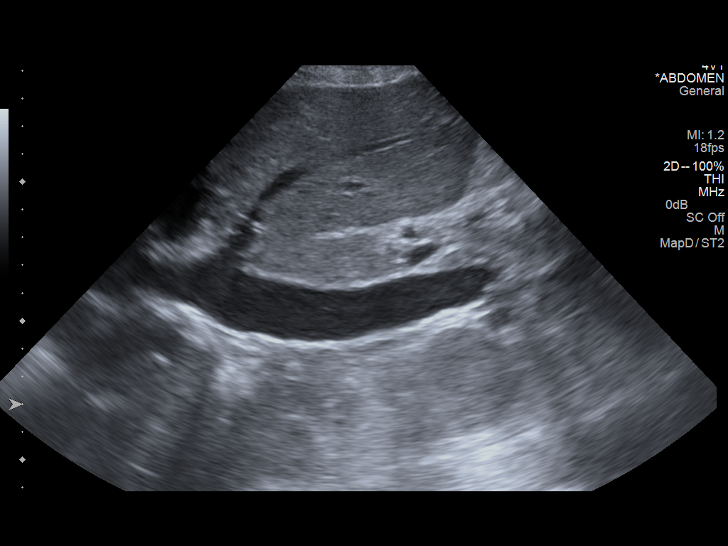
[im 5/27]
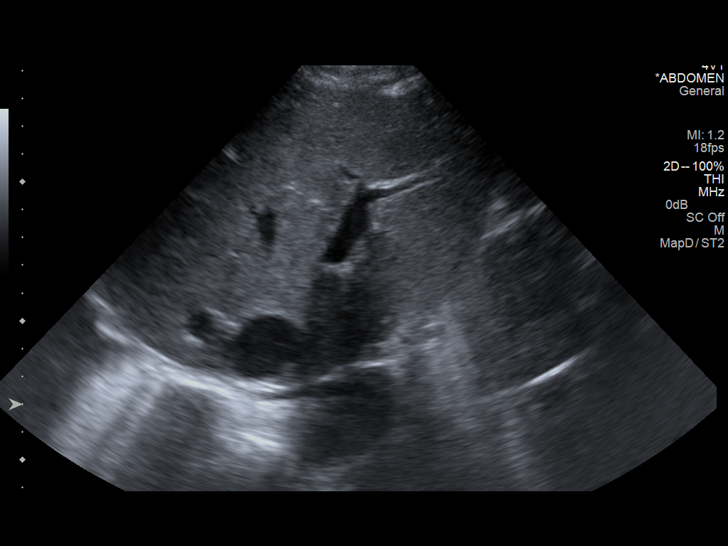
[im 7/27]
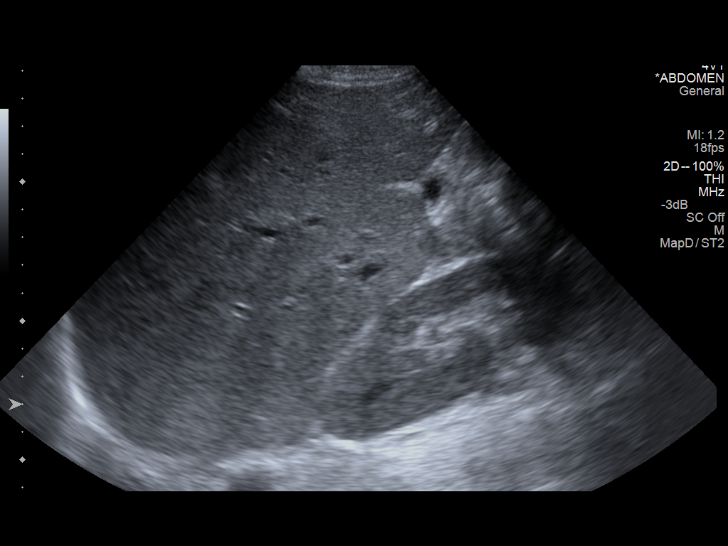
[im 9/27]
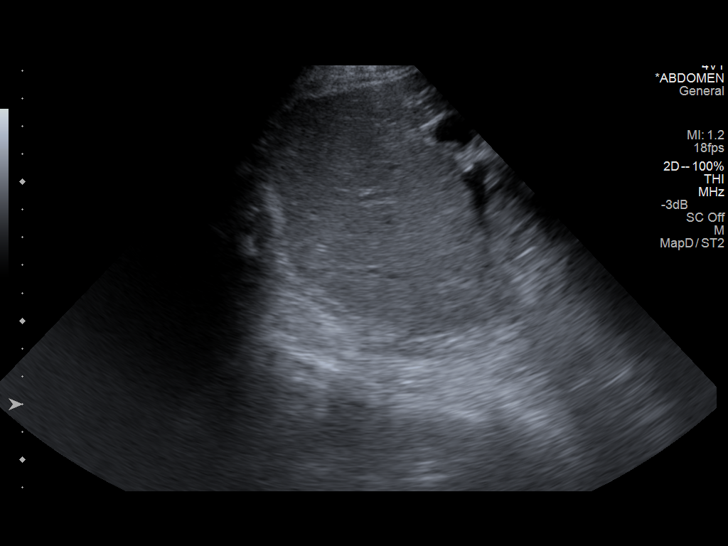
[im 10/27]
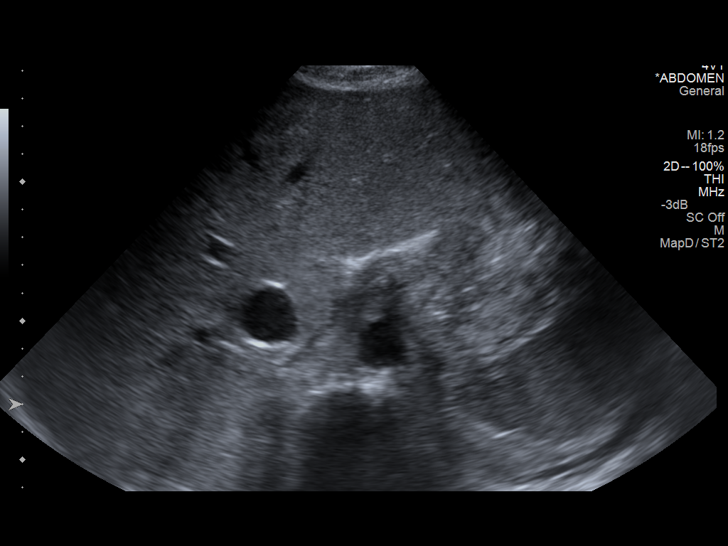
[im 12/27]
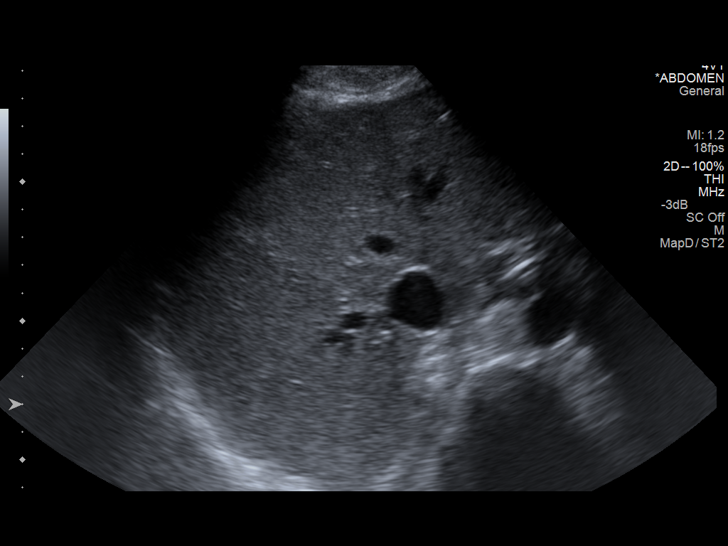
[im 15/27]
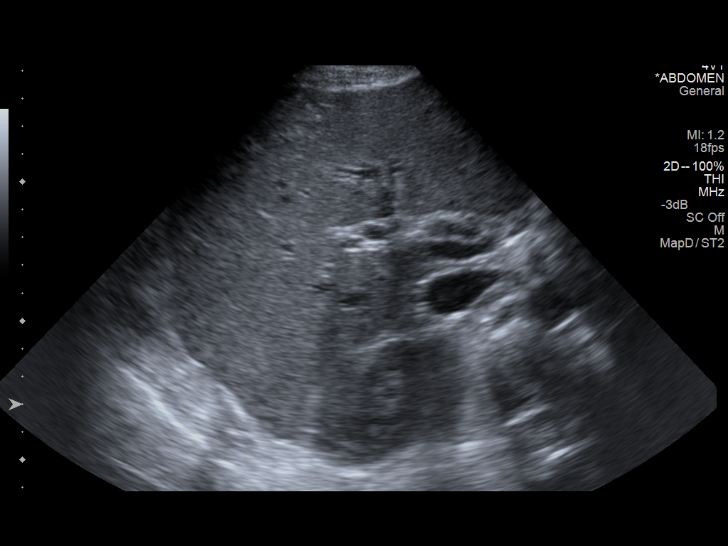
[im 17/27]
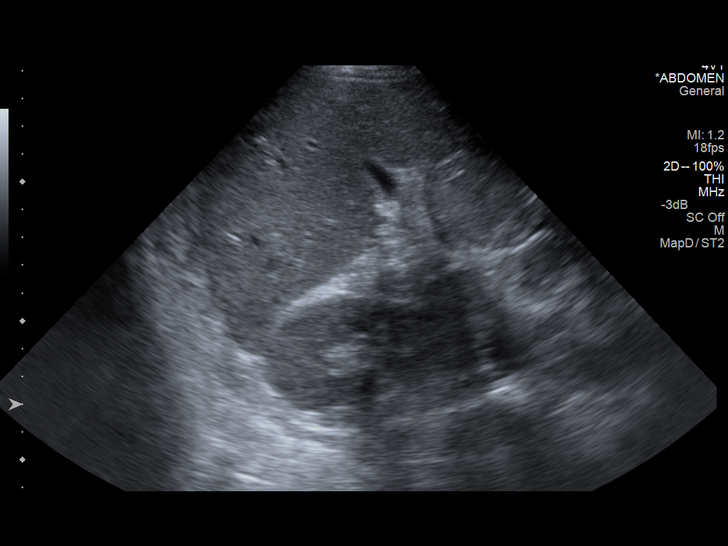
[im 18/27]
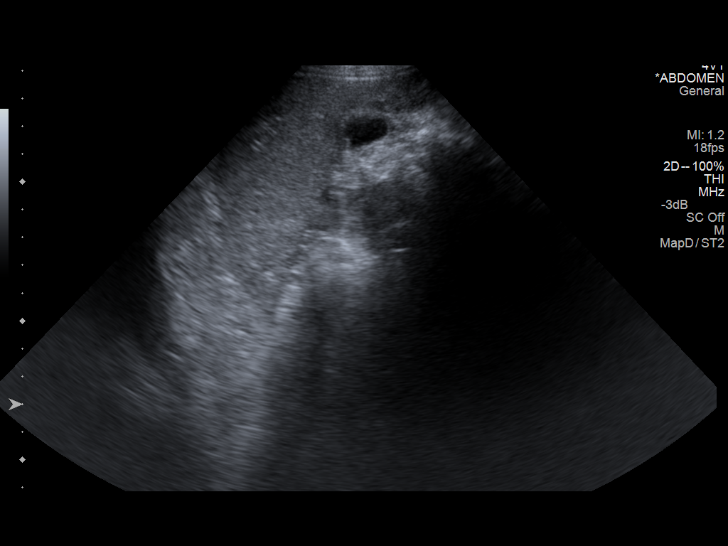
[im 20/27]
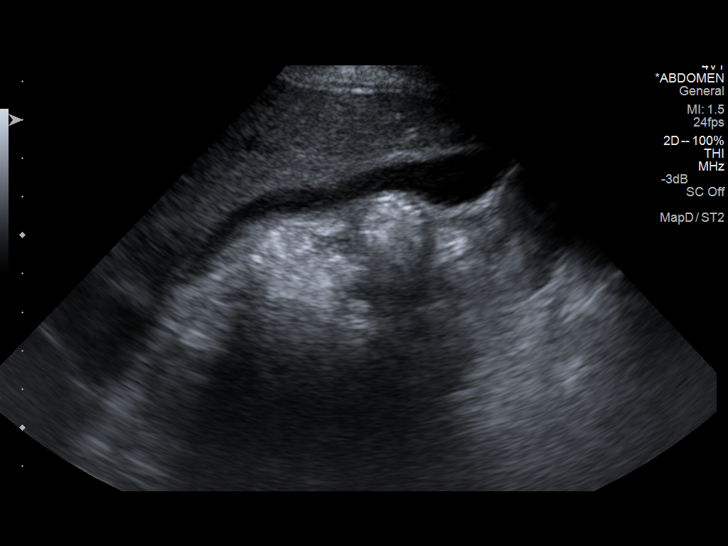
[im 22/27]
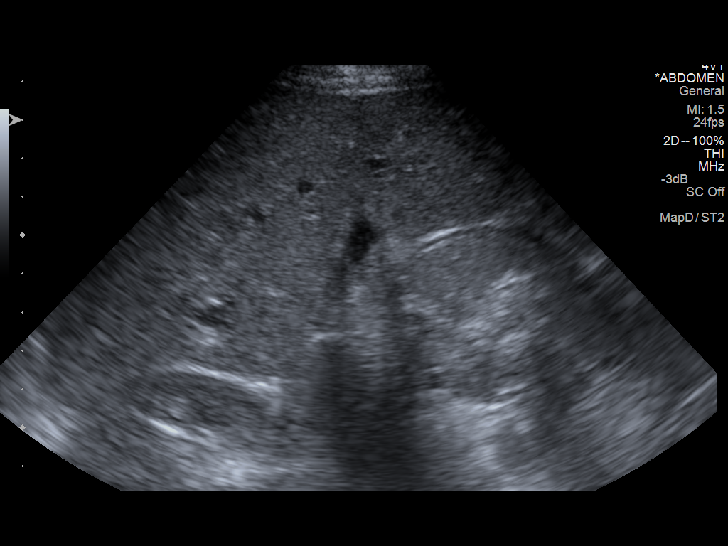
[im 24/27]
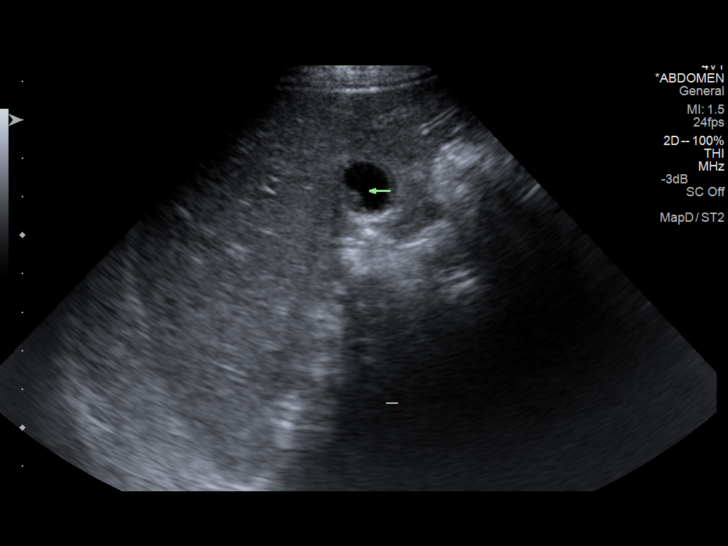
[im 27/27]
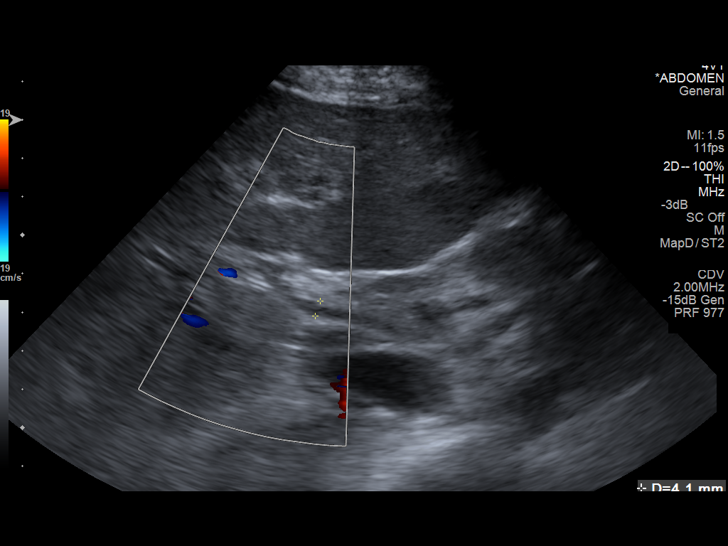

[14 of 25 positions shown; findings below may reference images not displayed]

FINDINGS: Gallbladder:

Tiny amount of sludge in the gallbladder cannot be excluded. No
gallstones noted gallbladder wall thickness 1.9 mm. Negative
Murphy's sign.

Common bile duct:

Diameter: 5.6 mm

Liver:

No focal lesion identified. Within normal limits in parenchymal
echogenicity.
IMPRESSION: Tiny amount of sludge in the gallbladder cannot be excluded. Exam is
otherwise normal. Liver echotexture normal.

## 2016-11-27 IMAGING — CR DG CHEST 1V PORT
1 series · 1 of 1 positions shown · non-contrast
Comparison: Portable chest x-rays 09/17/2014, 09/16/2014. Two-view
chest x-ray 08/19/2014.

CLINICAL DATA: Ventilator dependent respiratory failure. Sepsis.
Current history of uncontrolled type 1 diabetes. Quadriplegia.
Follow-up left lower lobe pneumonia.

EXAM:
PORTABLE CHEST - 1 VIEW

[AP]
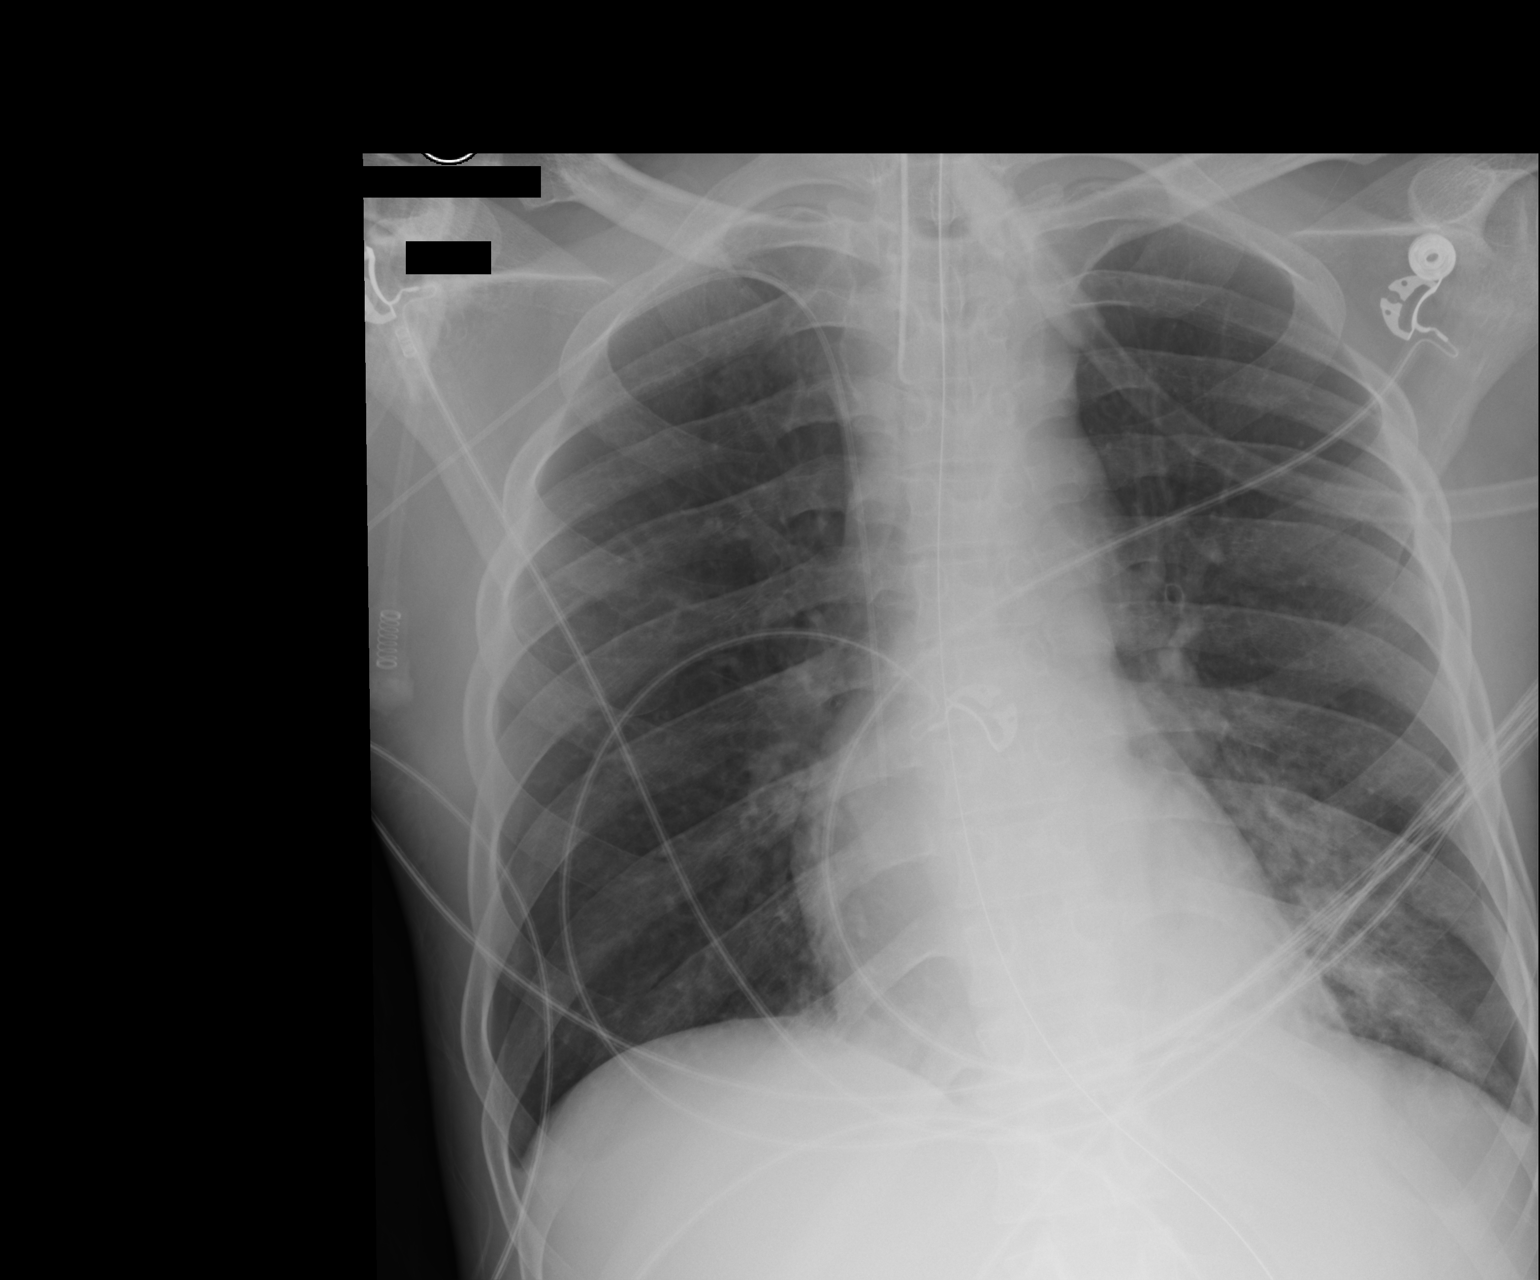

[1 of 1 positions shown; findings below may reference images not displayed]

FINDINGS: Cardiomediastinal silhouette unremarkable, unchanged. Endotracheal
tube tip in satisfactory position projecting approximately 6 cm
above the carina. Right arm PICC tip projects at the cavoatrial
junction. Nasogastric tube courses below the diaphragm into the
stomach though its tip is not included on the image. Interval
improvement in aeration in the left lower lobe since 2 days ago,
with moderate airspace opacity persisting. Improved aeration at the
right lung base. Lungs now clear otherwise. No visible pleural
effusions.
IMPRESSION: Support apparatus satisfactory. Improved left lower lobe pneumonia,
though moderate airspace opacities persist. Resolution of right
basilar atelectasis. No new abnormalities.

## 2016-11-28 IMAGING — CR DG CHEST 1V PORT
1 series · 1 of 1 positions shown · non-contrast
Comparison: 09/19/2014

CLINICAL DATA: Respiratory failure and shortness of breath

EXAM:
PORTABLE CHEST - 1 VIEW

[AP]
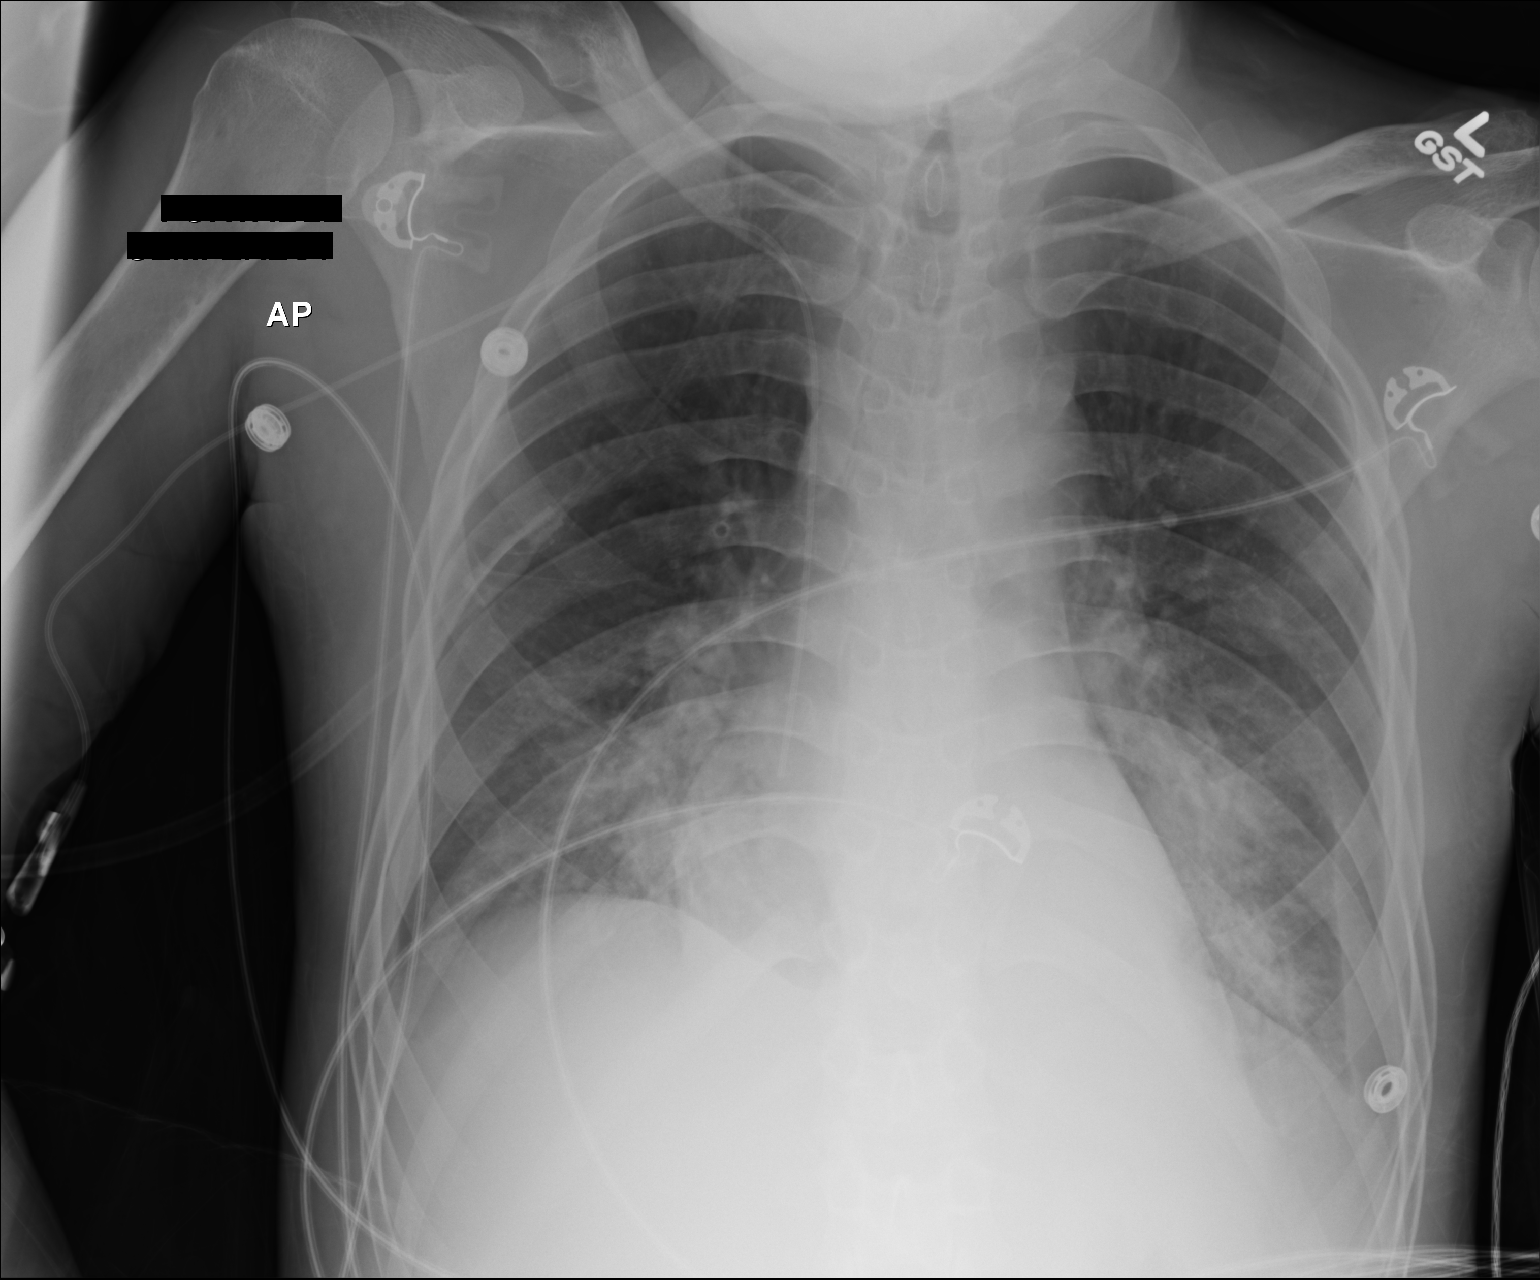

[1 of 1 positions shown; findings below may reference images not displayed]

FINDINGS: Interval tracheal and esophageal extubation. Right upper extremity
PICC positioning is stable and unremarkable. Interval increased
density of the lower lungs. The streaky opacity at the left base was
present on previous studies. The opacity at the right base is new.
Small left pleural effusion noted. No pneumothorax.
IMPRESSION: 1. Increased density of the lower lungs after extubation. This could
represent progressive pneumonia or interval aspiration.
2. Small left pleural effusion.

## 2016-11-29 IMAGING — CR DG CHEST 1V PORT
1 series · 1 of 1 positions shown · non-contrast
Comparison: Portable chest x-ray September 20, 2014 at [DATE] a.m.

CLINICAL DATA: Shortness of breath, pulmonary edema, history of
asthma, diabetes, and cardiac dysrhythmia

EXAM:
PORTABLE CHEST - 1 VIEW

[AP]
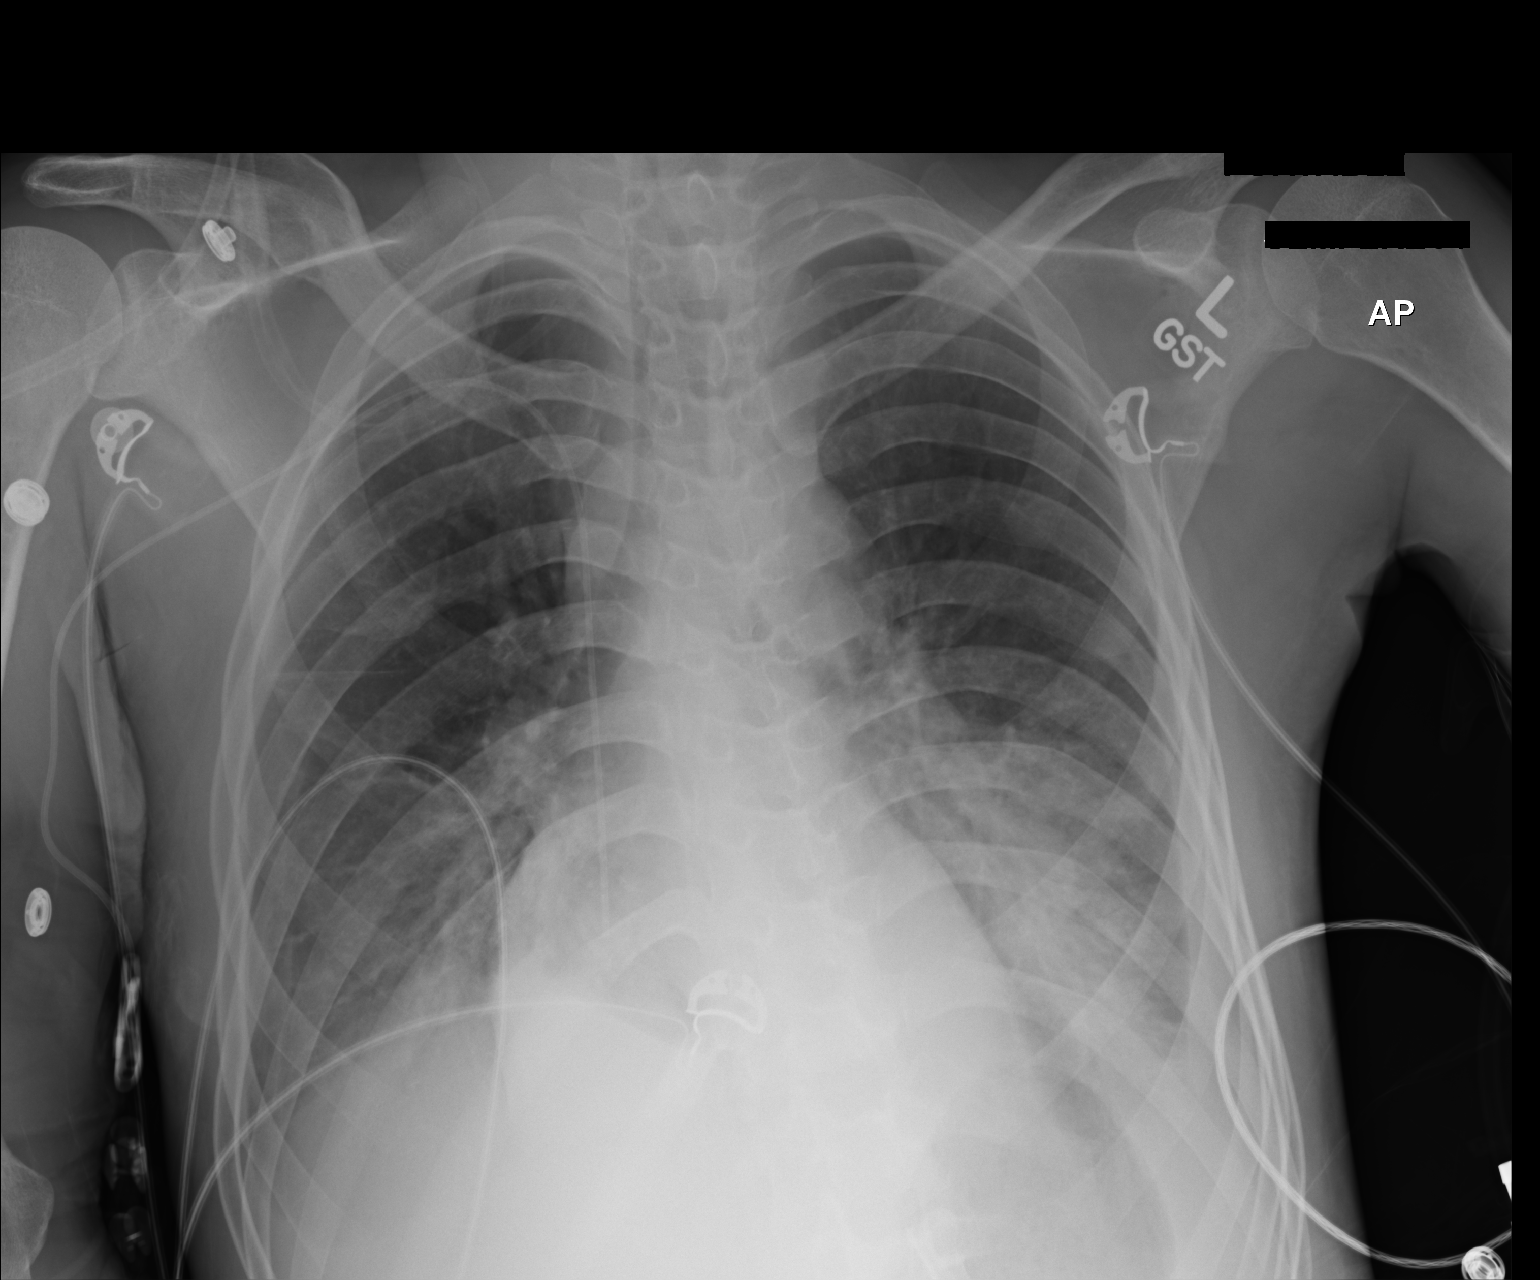

[1 of 1 positions shown; findings below may reference images not displayed]

FINDINGS: The lungs are adequately inflated. There is persistent increased
density at both lung bases worrisome for atelectasis or aspiration.
The left hemidiaphragm is now obscured and the right hemidiaphragm
is nearly obscured. The upper lobes are clear. There is pleural
fluid on the left. The cardiac silhouette and pulmonary vascularity
are normal. The right sided PICC line tip projects over the
cavoatrial junction and is stable.
IMPRESSION: Slight interval worsening in the appearance of the lung bases
consistent with progressive atelectasis or pneumonia. There is a
small left pleural effusion.

## 2016-12-02 IMAGING — CR DG CHEST 1V PORT
1 series · 1 of 1 positions shown · non-contrast
Comparison: 09/21/2014

CLINICAL DATA: Followup pneumonia.  History of asthma and stroke.

EXAM:
PORTABLE CHEST - 1 VIEW

[AP]
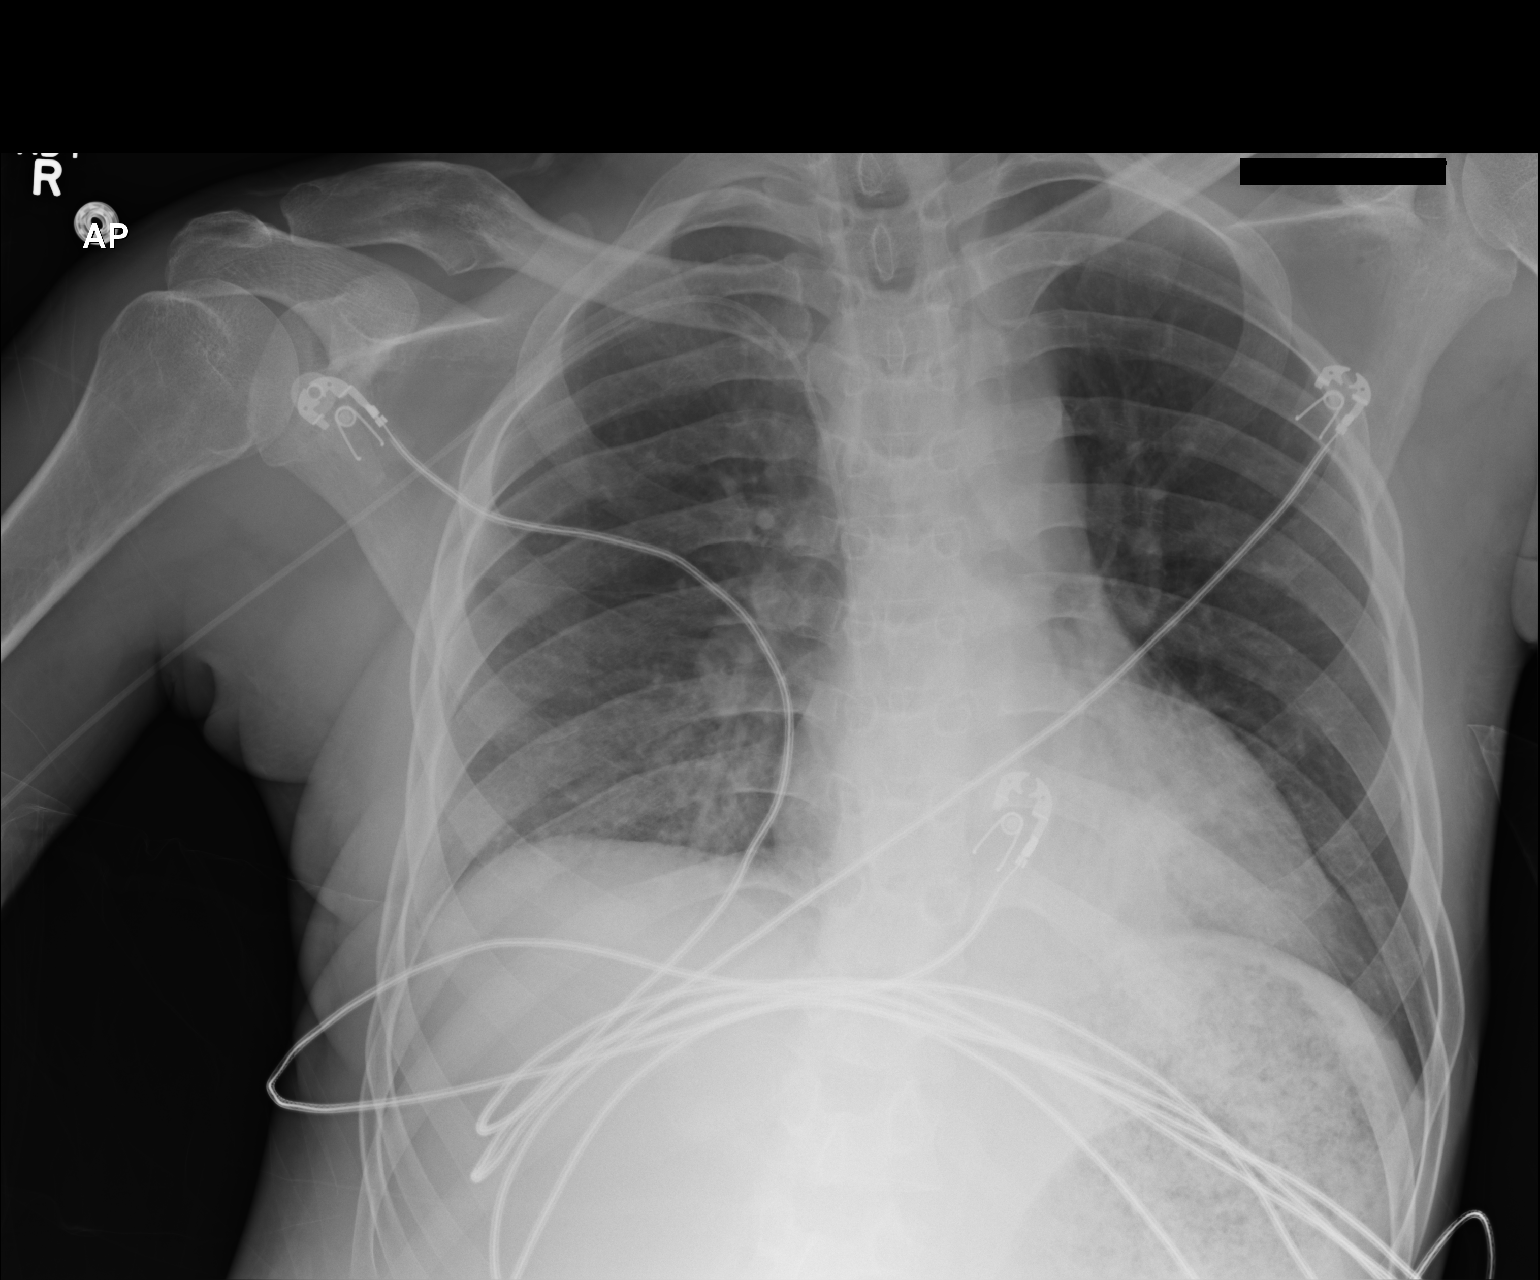

[1 of 1 positions shown; findings below may reference images not displayed]

FINDINGS: Right PICC line remains in place with the tip near the cavoatrial
junction. Heart is normal size. Bilateral lower lobe airspace
opacities are again noted but improved since prior study. This is
slightly more pronounced in the left lower lobe. No effusions. No
acute bony abnormality.
IMPRESSION: Continued but improving bilateral lower lobe infiltrates/ pneumonia.

## 2016-12-07 IMAGING — CR DG CHEST 1V PORT
1 series · 1 of 1 positions shown · non-contrast
Comparison: September 24, 2014

CLINICAL DATA: Shortness of Breath

EXAM:
PORTABLE CHEST - 1 VIEW

[AP]
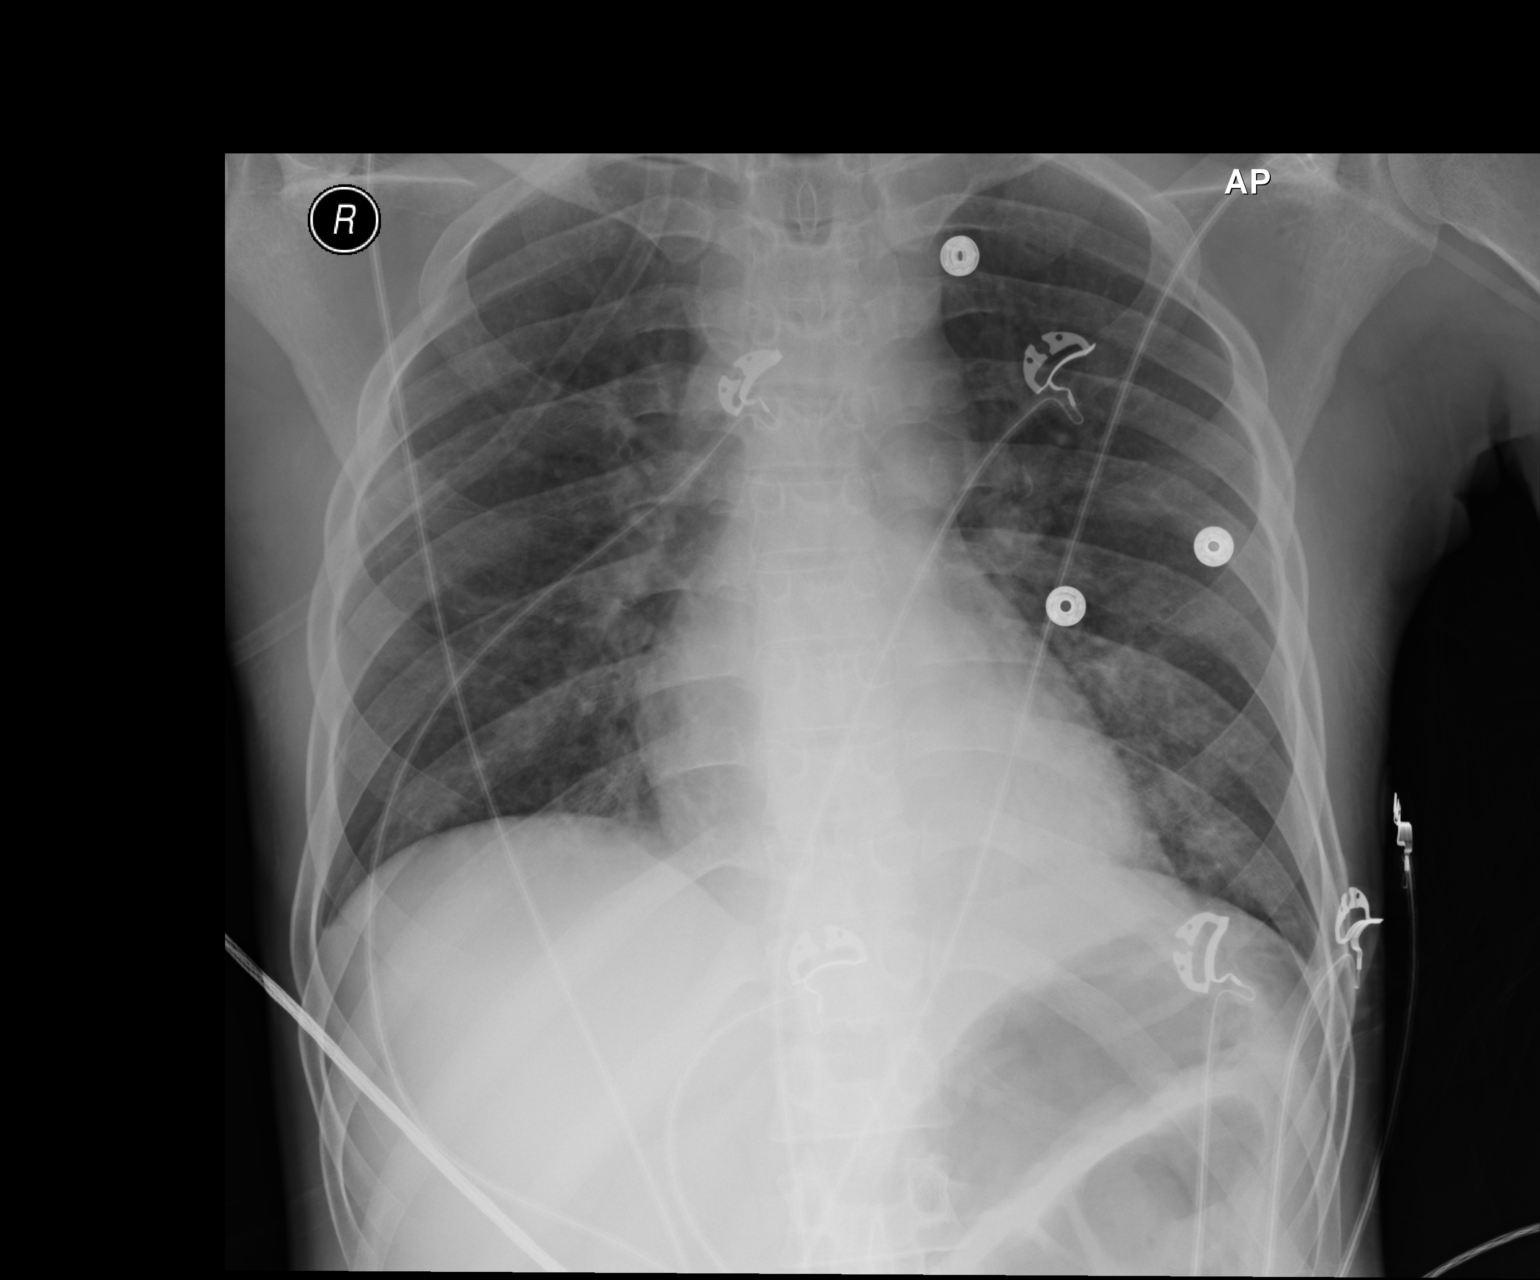

[1 of 1 positions shown; findings below may reference images not displayed]

FINDINGS: There is persistent left lower lobe consolidation. Right lung is now
clear. Heart size and pulmonary vascularity are normal. No
adenopathy. No bone lesions.
IMPRESSION: Persistent left lower lobe consolidation. Right lung clear. Lungs
elsewhere clear. No change in cardiac silhouette.

## 2016-12-10 IMAGING — MR MR PELVIS WO/W CM
4 of 8 series · 22 of 48 positions shown · IV contrast (multihance)
Comparison: MRI of the pelvis with and without contrast 09/16/2014.

CLINICAL DATA: Quadriplegic patient with methicillin sensitive
bacteremia and decubitus ulcers over the ischial tuberosities.

EXAM:
MRI PELVIS WITHOUT AND WITH CONTRAST
TECHNIQUE: Multiplanar multisequence MR imaging of the pelvis was performed
both before and after administration of intravenous contrast.
CONTRAST:  12 mL MULTIHANCE GADOBENATE DIMEGLUMINE 529 MG/ML IV SOLN

[Series 4: T1 · coronal · 5.0mm · 0.70mm/px · 6 of 23 slices shown (1 of 2)]
[im 1/23]
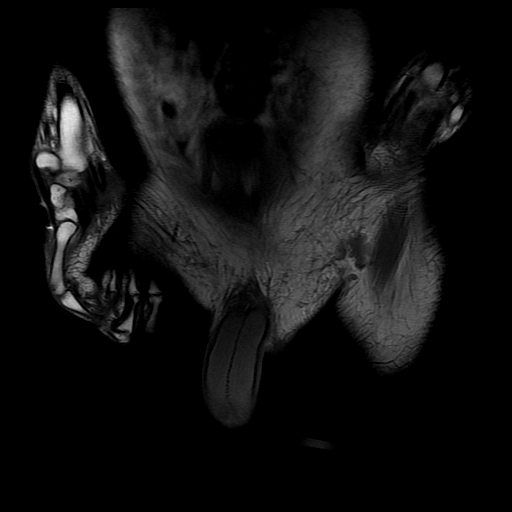
[im 5/23]
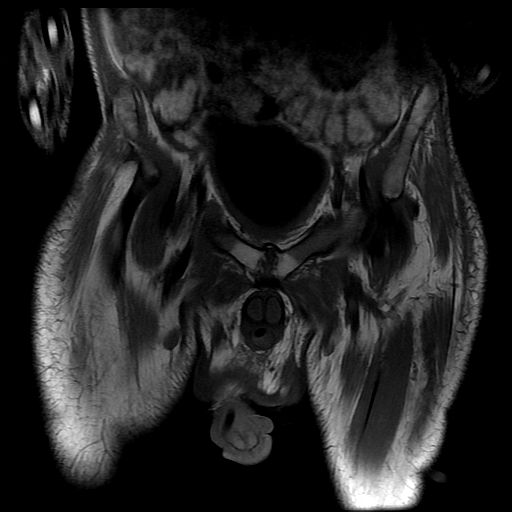
[im 9/23]
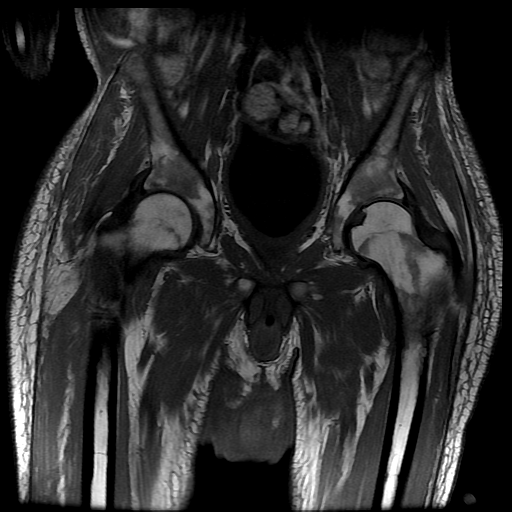
[im 14/23]
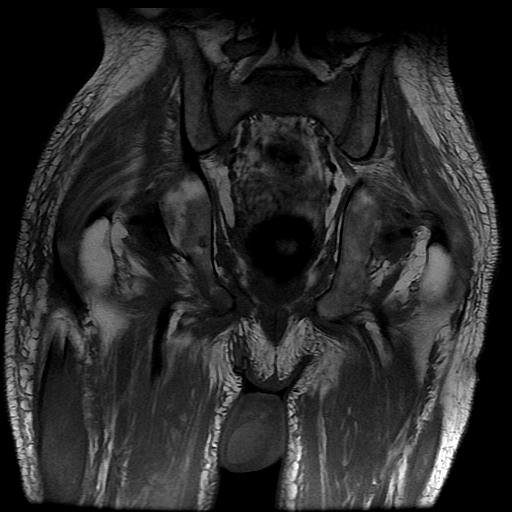
[im 18/23]
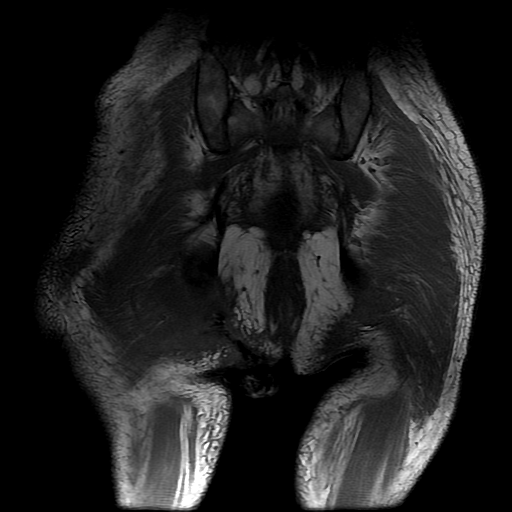
[im 23/23]
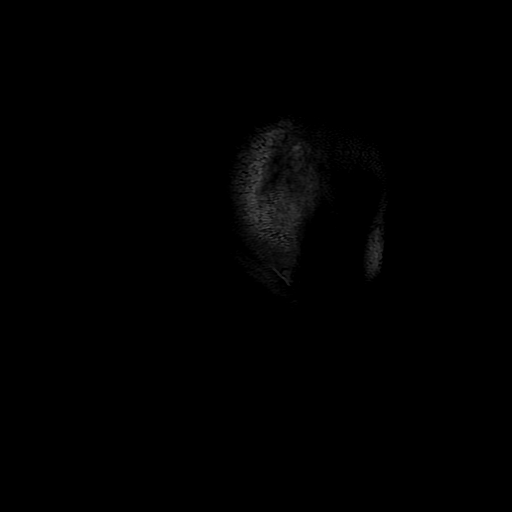

[Series 5: cor fse ir · coronal · 5.0mm · 0.70mm/px · 5 of 24 slices shown]
[im 1/24]
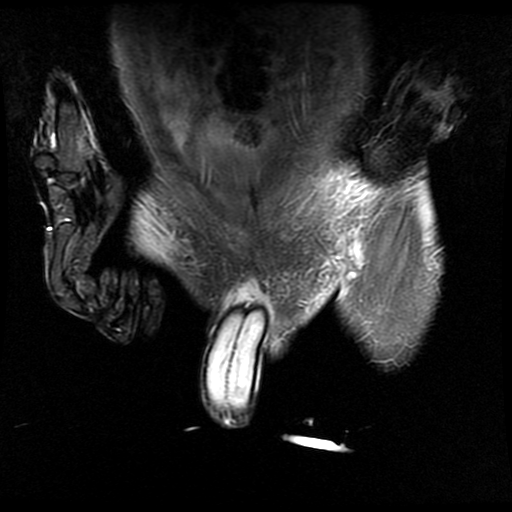
[im 6/24]
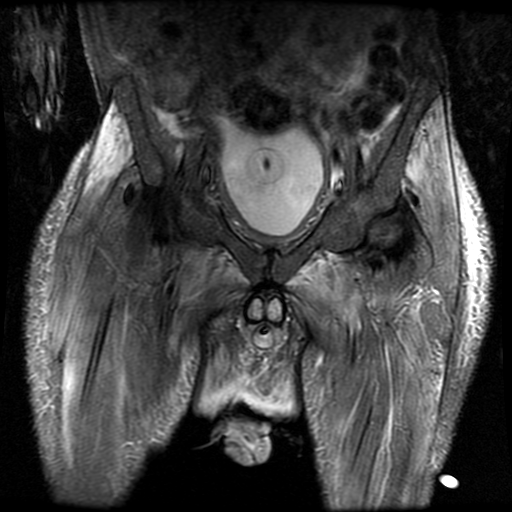
[im 12/24]
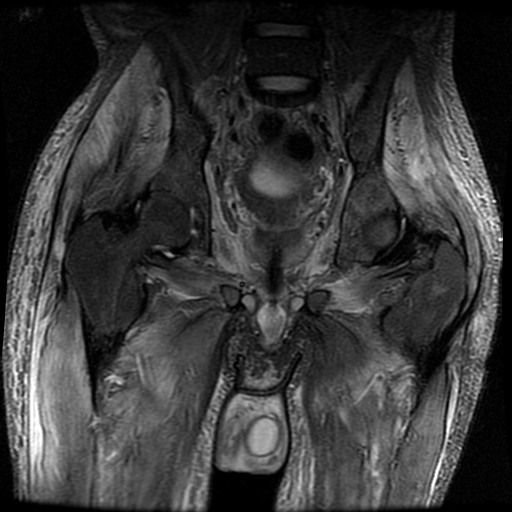
[im 18/24]
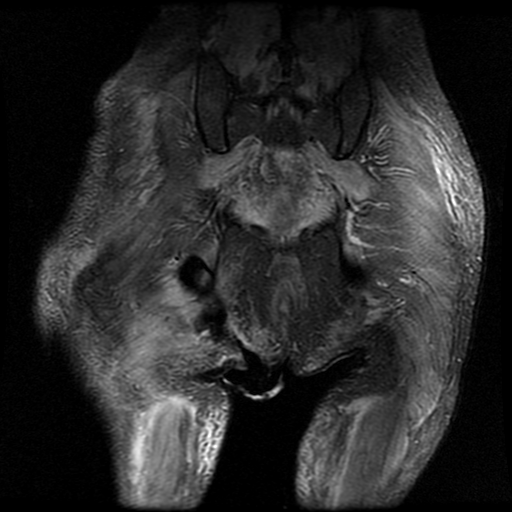
[im 24/24]
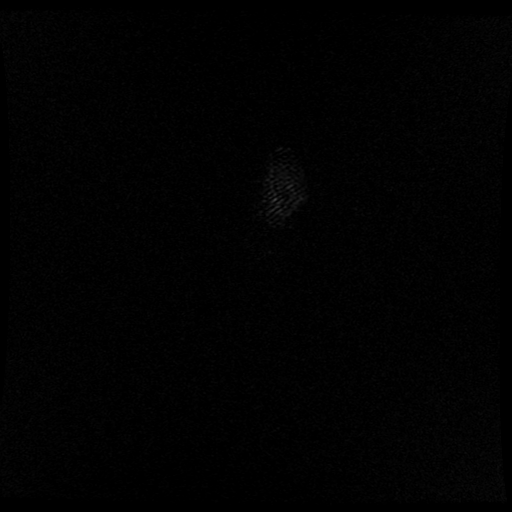

[Series 6: T1 · axial · 8.0mm · 0.70mm/px · z∈[-174,+135]mm · 6 of 32 slices shown (2 of 2)]
[im 1/32]
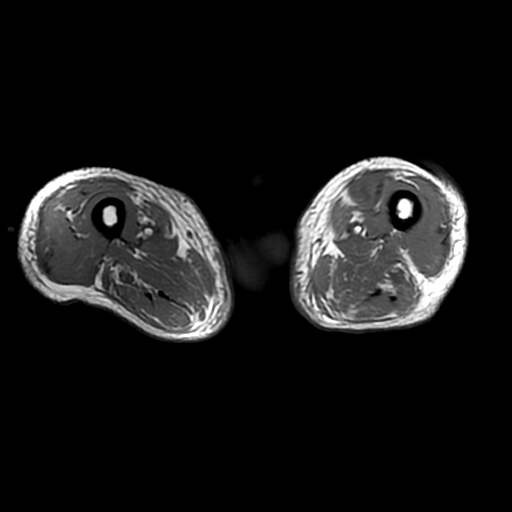
[im 7/32]
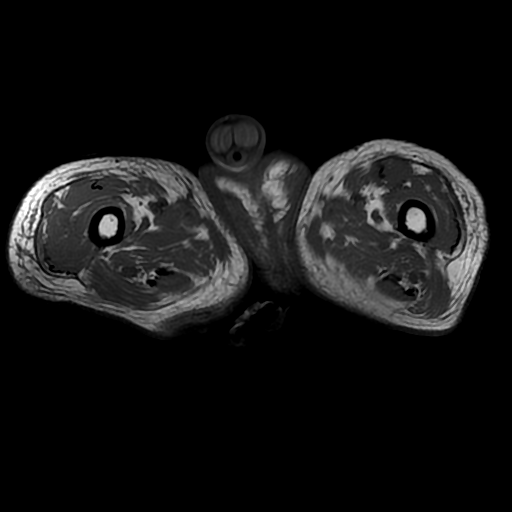
[im 13/32]
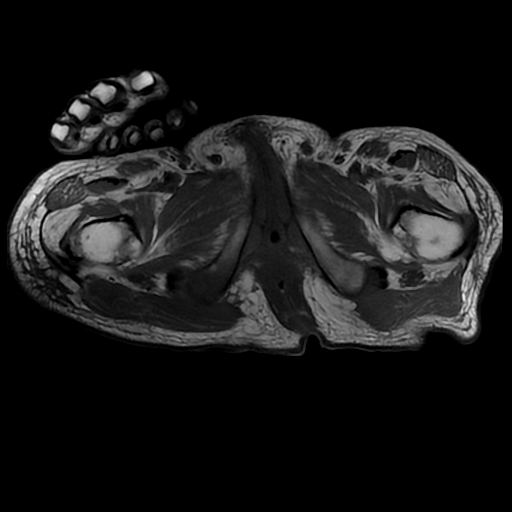
[im 19/32]
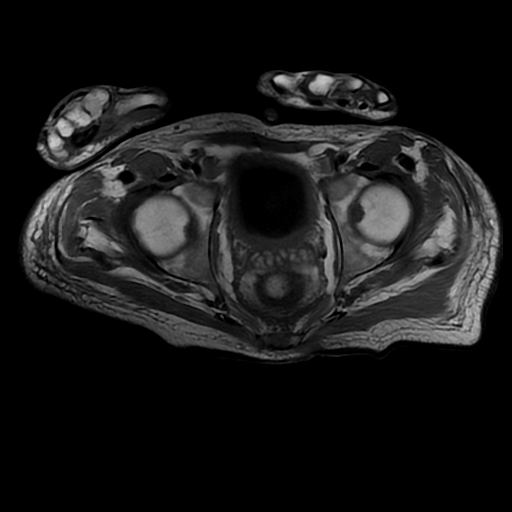
[im 25/32]
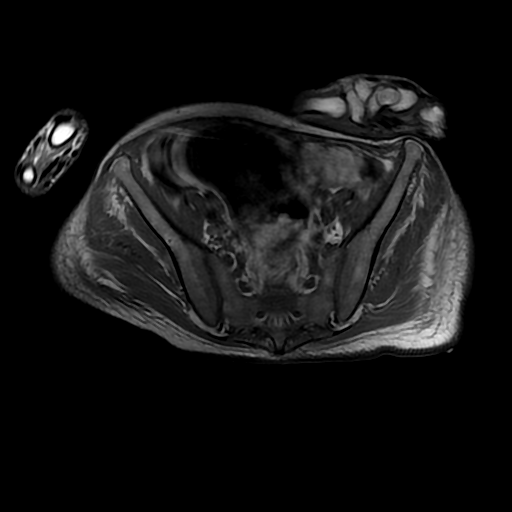
[im 32/32]
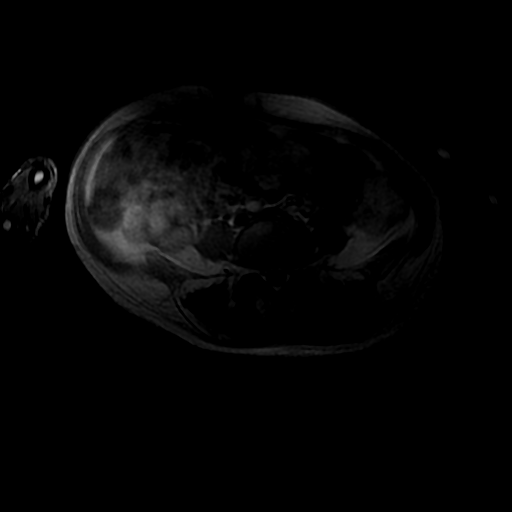

[Series 7: T1 post-contrast · axial · 8.0mm · 1.41mm/px · z∈[-174,+135]mm · 5 of 32 slices shown]
[im 1/32]
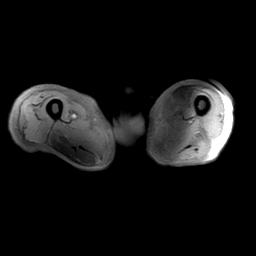
[im 7/32]
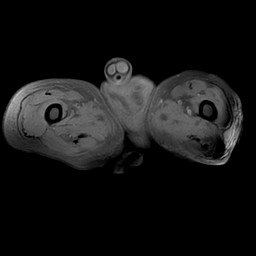
[im 13/32]
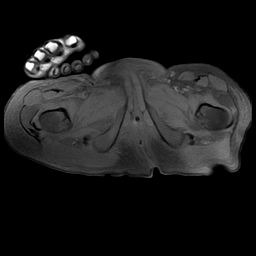
[im 19/32]
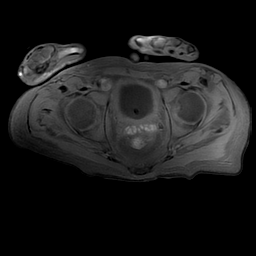
[im 32/32]
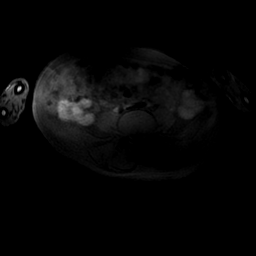

[22 of 48 positions shown; findings below may reference images not displayed]

FINDINGS: As on the prior study, the patient has decubitus ulcers over the
ischial tuberosities, larger on the right. The decubitus ulcer on
the right extends to bone. There is marrow edema and enhancement in
these ischial tuberosity and right inferior pubic ramus consistent
with osteomyelitis. The extent of marrow signal change appears
slightly increased compared to the prior exam. There is likely
cortical destructive change in the ischial tuberosity. No rim
enhancing fluid collection is seen in this location although there
is intense underlying edema and enhancement in the gluteal
musculature consistent with myositis. A rind of soft tissue about
the wound does not enhance consistent with necrosis.

Marrow edema in the left ischial tuberosity is normal without
evidence of osteomyelitis. As seen on the right, there is an edema
and enhancement in the gluteal musculature about the wound
consistent with myositis. A rind of soft tissue about the wound
which does not enhance compatible with necrosis.

Diffuse subcutaneous edema is seen about the pelvis. There is no hip
joint effusion. No evidence of bursitis is identified.
IMPRESSION: Large decubitus ulcer over the right distal tuberosity with edema
and enhancement in the right inferior pubic ramus consistent with
osteomyelitis. No abscess is identified although a rind of necrotic
tissue surrounds the wound. Edema and enhancement in the gluteal
musculature surrounding the wound is consistent with myositis

Smaller decubitus ulcer over the left distal tuberosity without
underlying abscess or osteomyelitis. Rind of nonenhancing tissue in
the wound is consistent with necrosis.

## 2016-12-18 IMAGING — CR DG CHEST 1V PORT
1 series · 1 of 1 positions shown · non-contrast
Comparison: 09/29/2014

CLINICAL DATA: Pt has fever, chills, and altered mental status. Hx
asthma and pneumonia

EXAM:
PORTABLE CHEST - 1 VIEW

[AP]
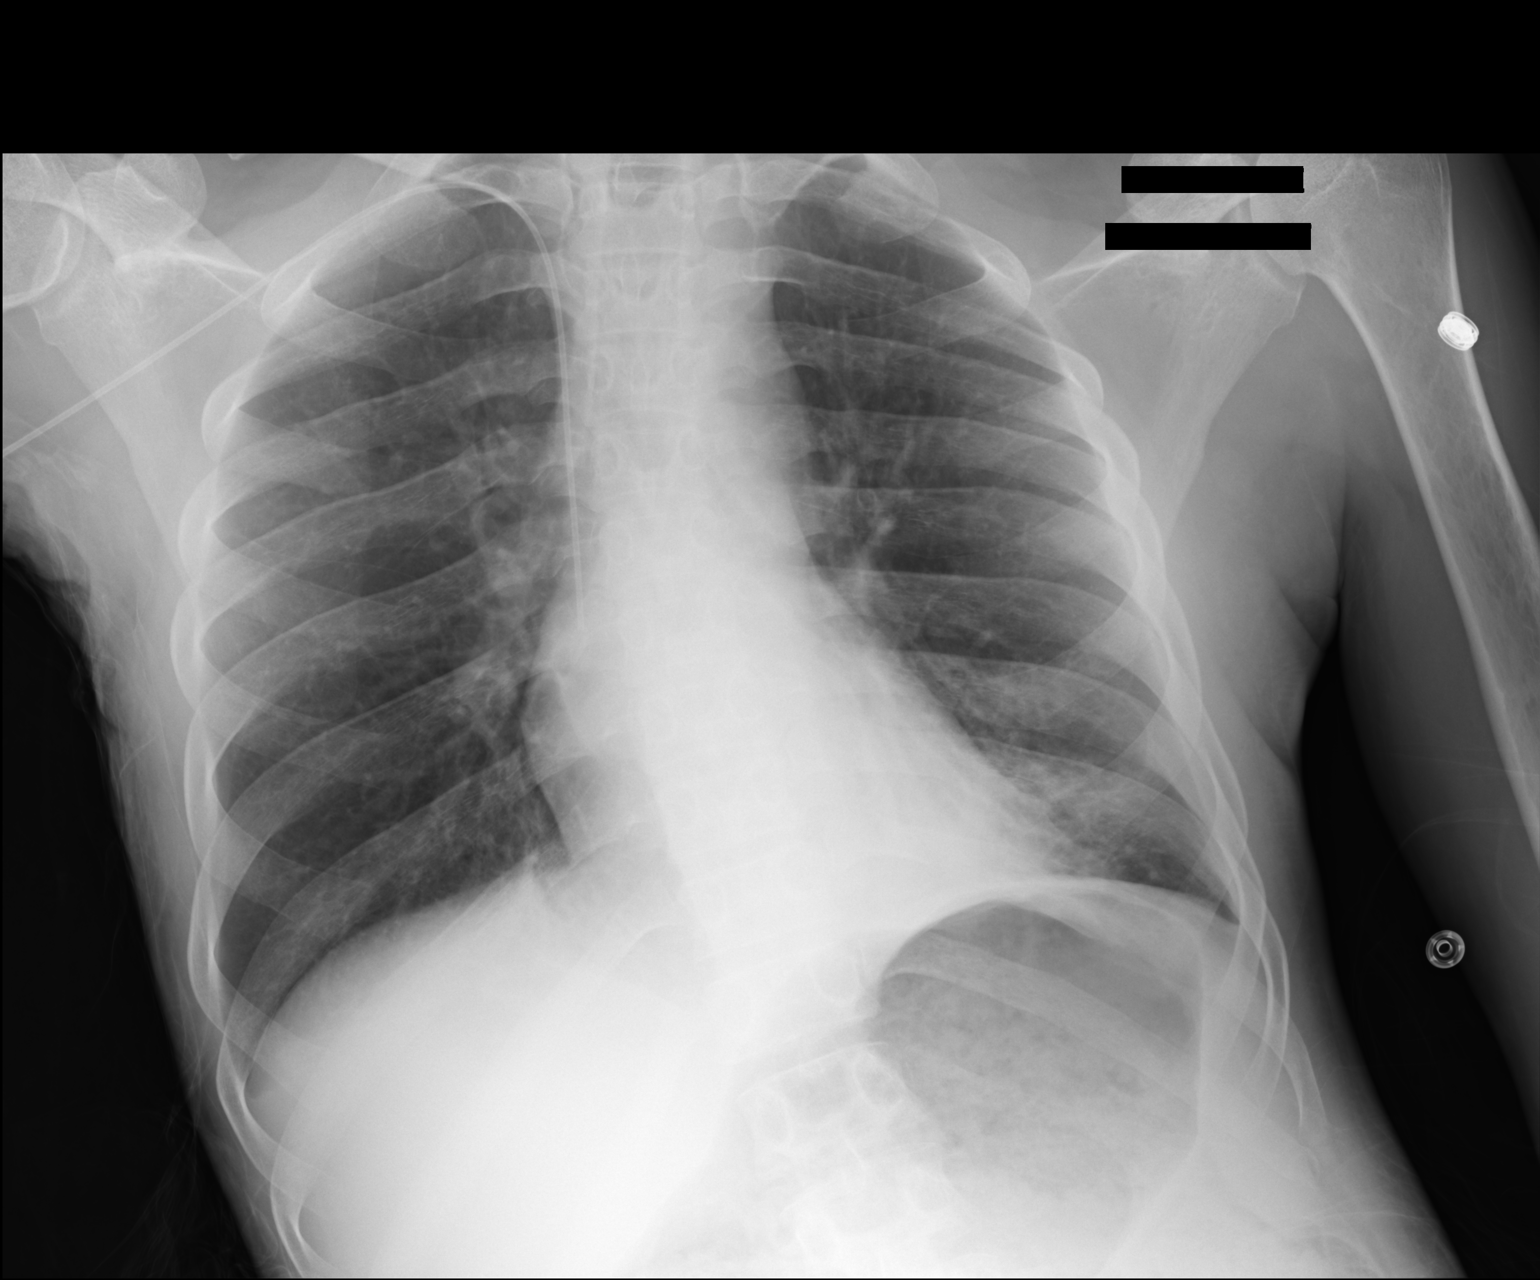

[1 of 1 positions shown; findings below may reference images not displayed]

FINDINGS: Right arm PICC has been placed to the cavoatrial junction. Patchy
infiltrate or atelectasis in the inferior lingula, obscuring a
portion of the left heart border. Right lung clear. No effusion. Old
healed right clavicle fracture.
IMPRESSION: 1. PICC line to the cavoatrial junction.
2. New lingular infiltrate or atelectasis.

## 2017-01-16 IMAGING — CR DG CHEST 1V PORT
1 series · 1 of 1 positions shown · non-contrast
Comparison: 10/10/2014

CLINICAL DATA: Cough, elevated white count

EXAM:
PORTABLE CHEST - 1 VIEW

[AP]
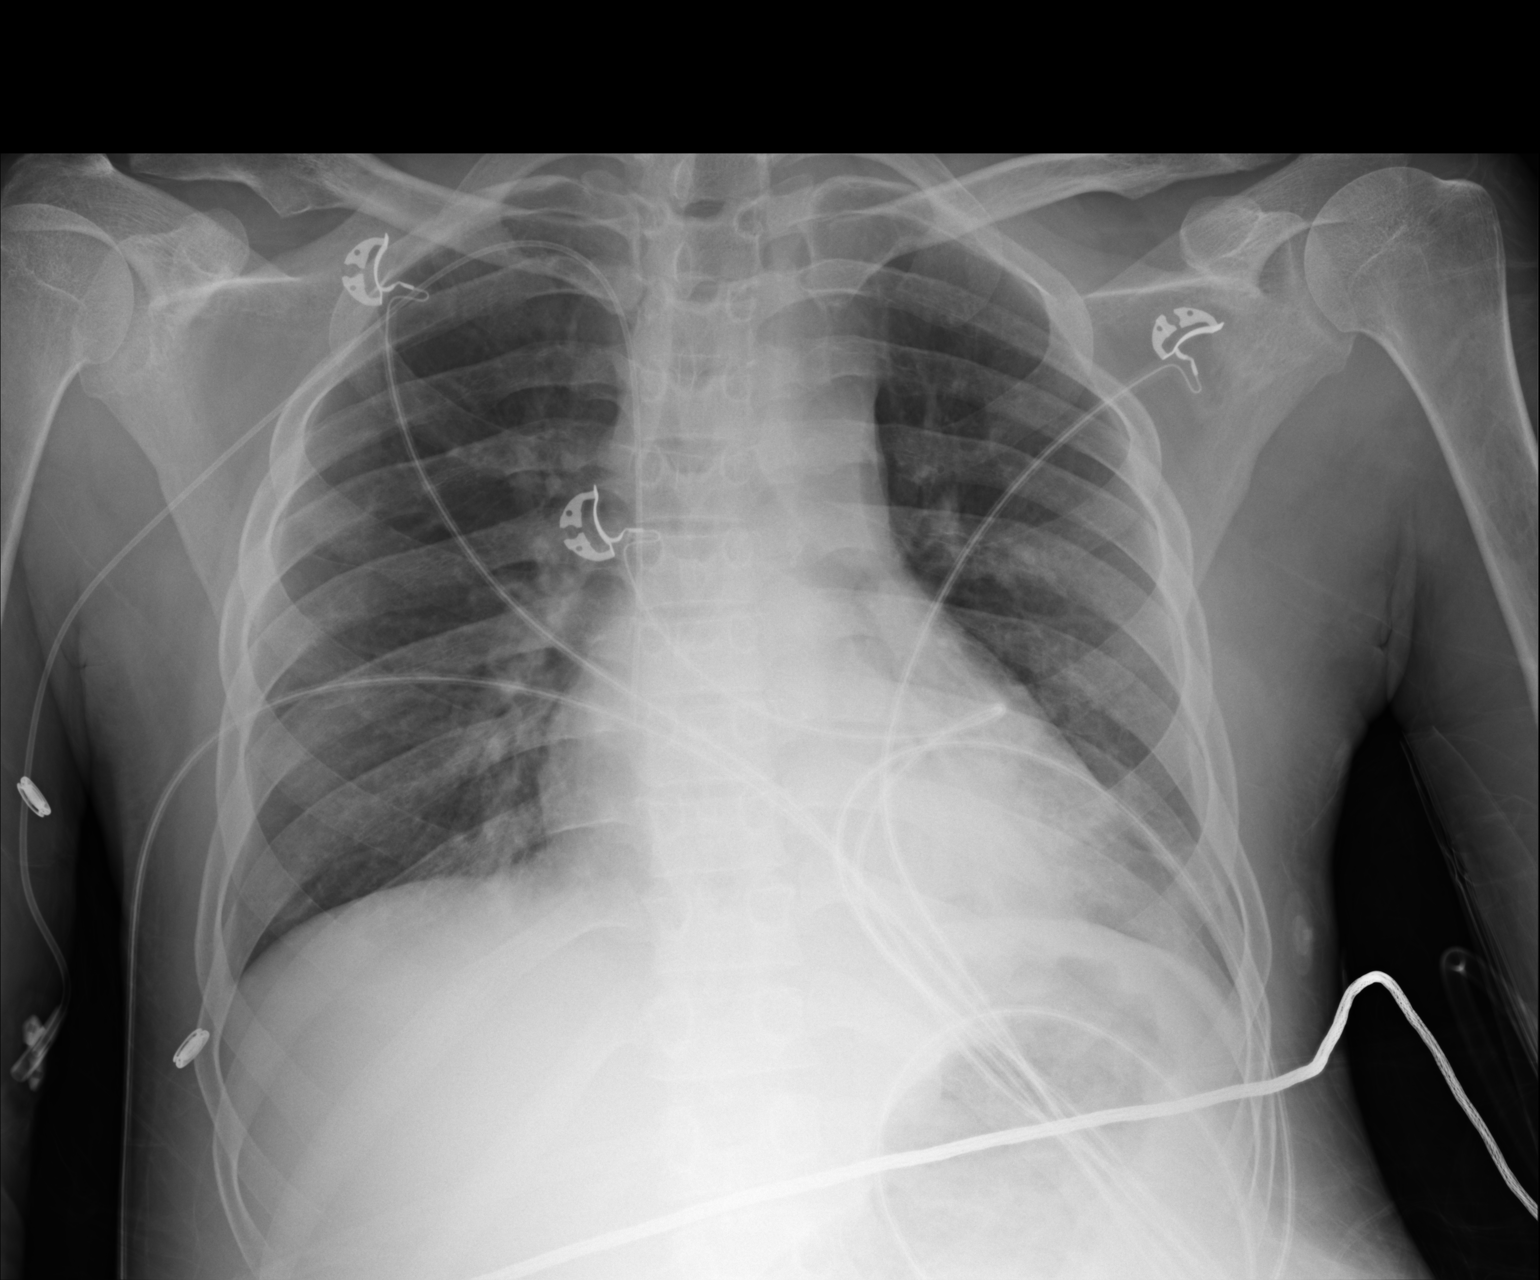

[1 of 1 positions shown; findings below may reference images not displayed]

FINDINGS: Consolidation in the left lower lobe, similar to prior study.
Increasing right medial basilar airspace opacity also noted.
Findings compatible with pneumonia. No effusions. Right PICC are
remains in place, unchanged.
IMPRESSION: Stable left lower lobe pneumonia. Increasing right medial basilar
density, also likely pneumonia.

## 2017-01-18 IMAGING — MR MR PELVIS WO/W CM
4 of 9 series · 18 of 48 positions shown · IV contrast (multihance)
Comparison: 10/02/2014

CLINICAL DATA: Quadriplegia. Sacral decubitus ulcer. Sepsis.
Foul-smelling discharge from the decubitus ulcer.

EXAM:
MRI PELVIS WITHOUT AND WITH CONTRAST
TECHNIQUE: Multiplanar multisequence MR imaging of the pelvis was performed
both before and after administration of intravenous contrast.
CONTRAST:  13mL MULTIHANCE GADOBENATE DIMEGLUMINE 529 MG/ML IV SOLN

[Series 2: T2 · coronal · 5.0mm · 0.70mm/px · 5 of 27 slices shown]
[im 1/27]
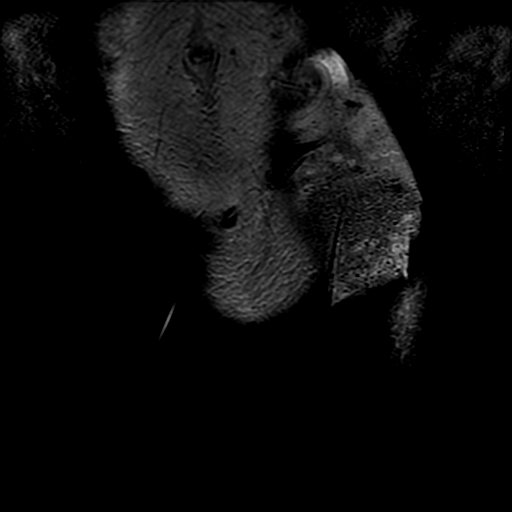
[im 7/27]
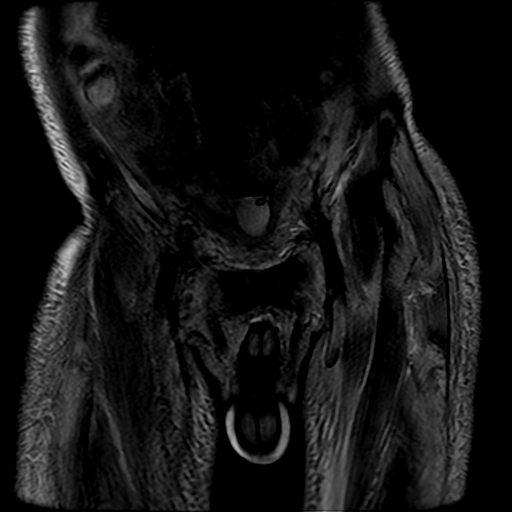
[im 14/27]
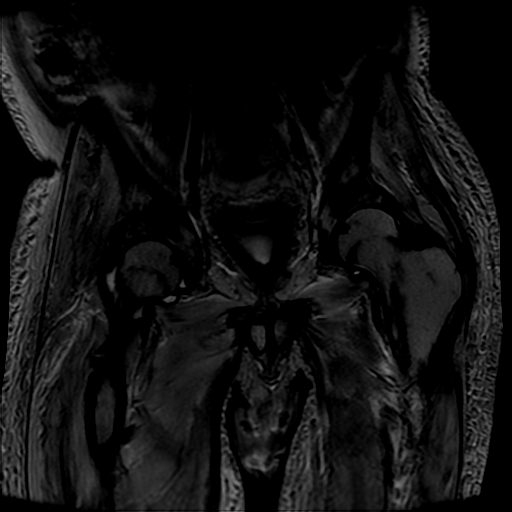
[im 20/27]
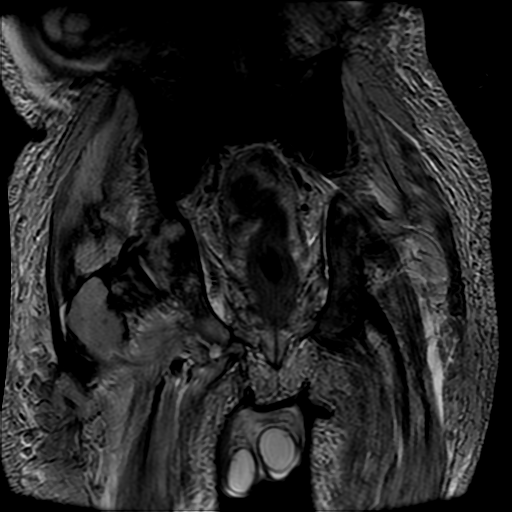
[im 27/27]
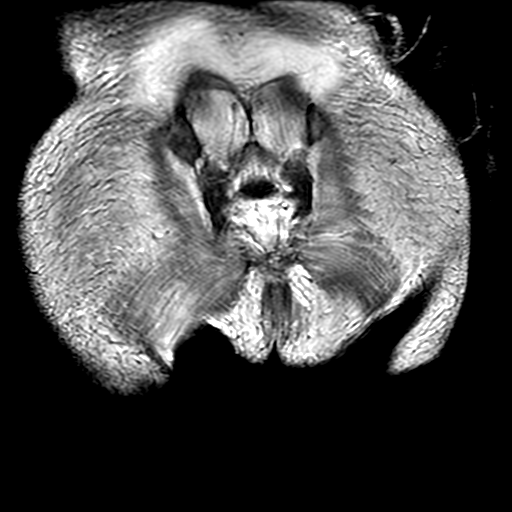

[Series 3: STIR · coronal · 5.0mm · 0.70mm/px · 4 of 27 slices shown]
[im 1/27]
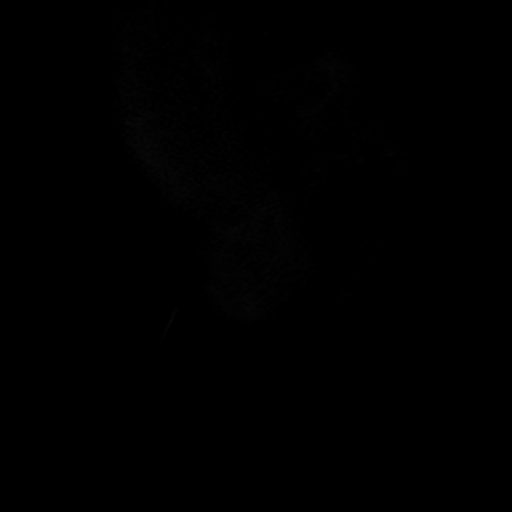
[im 9/27]
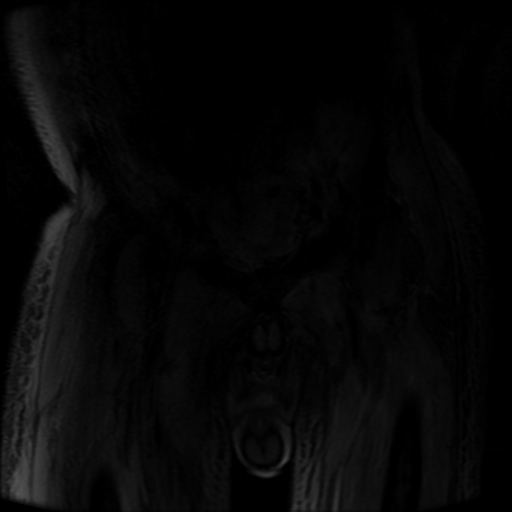
[im 18/27]
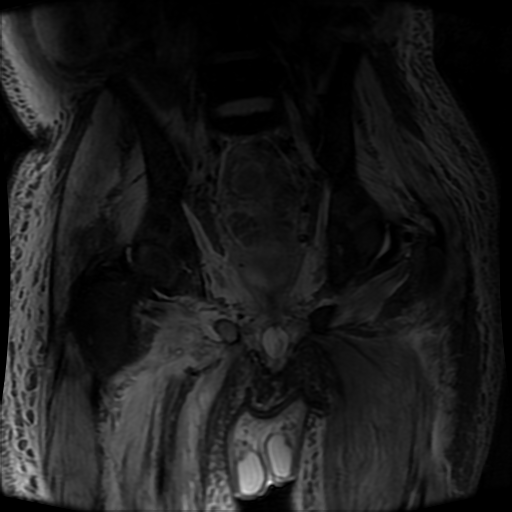
[im 27/27]
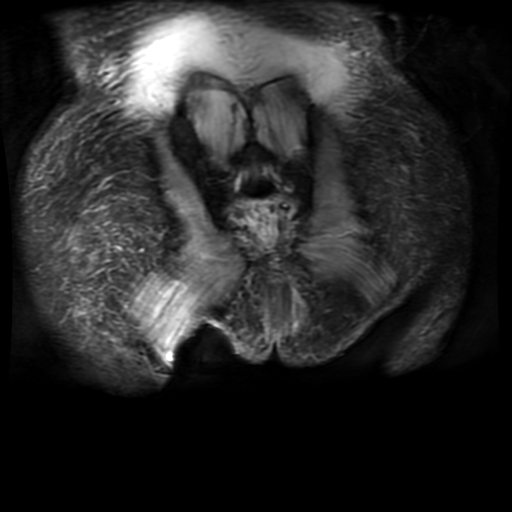

[Series 4: T2 fat-sat · axial · 5.0mm · 0.70mm/px · z∈[-51,+206]mm · 6 of 44 slices shown (1 of 2)]
[im 1/44]
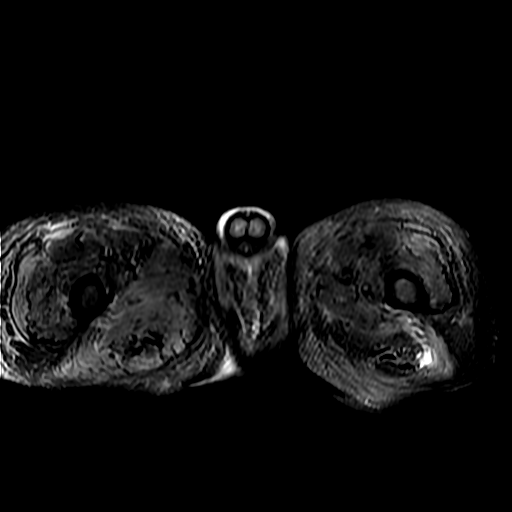
[im 9/44]
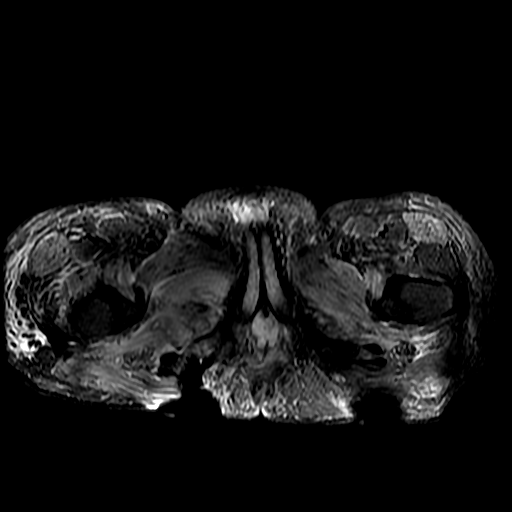
[im 18/44]
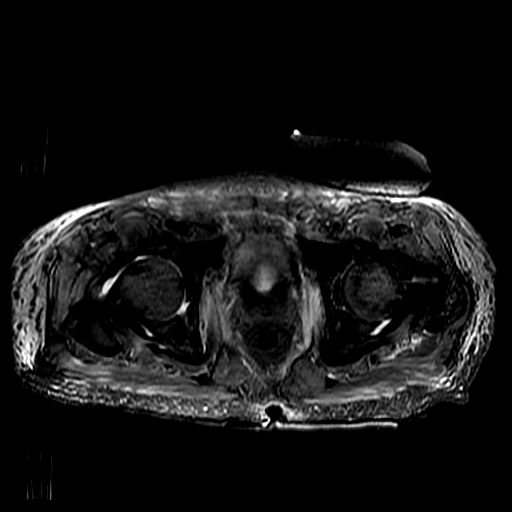
[im 26/44]
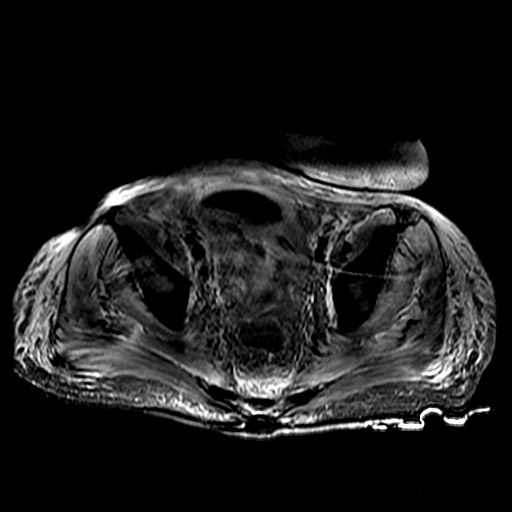
[im 35/44]
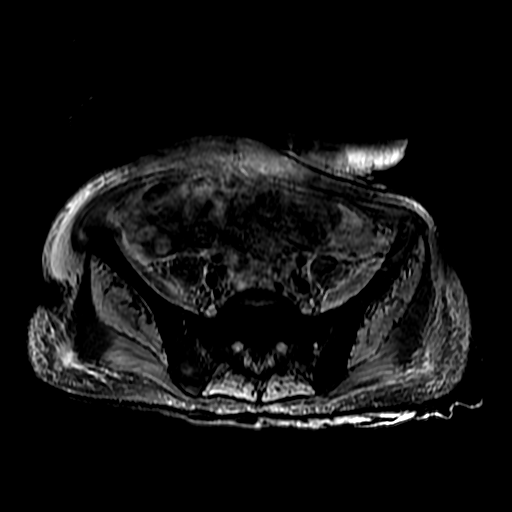
[im 44/44]
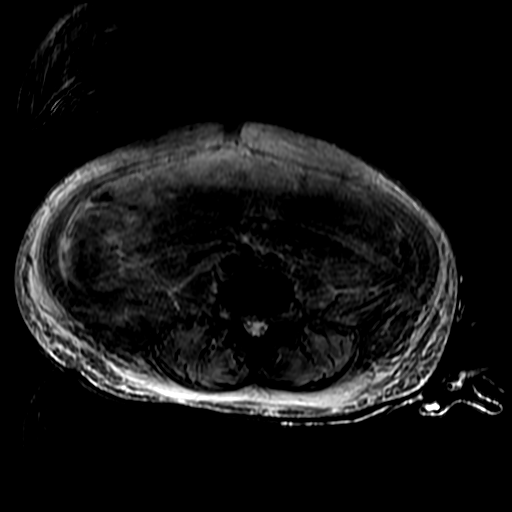

[Series 6: T2 fat-sat · sagittal · 6.0mm · 0.66mm/px · 3 of 45 slices shown (2 of 2)]
[im 8/45]
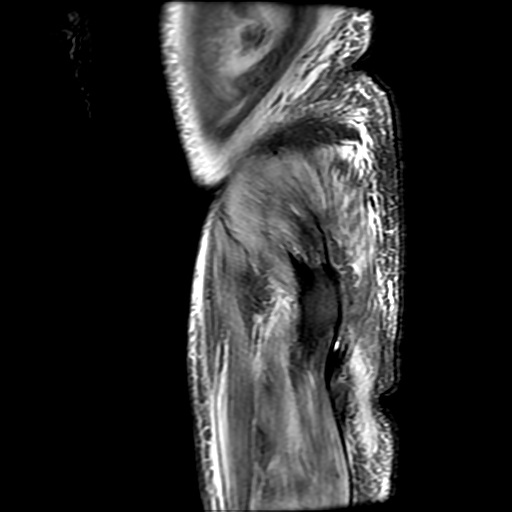
[im 23/45]
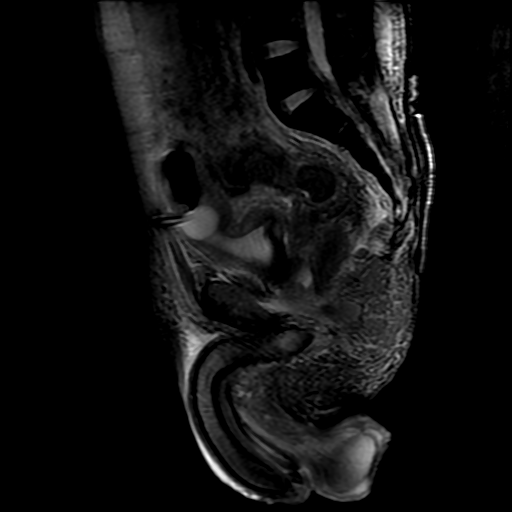
[im 37/45]
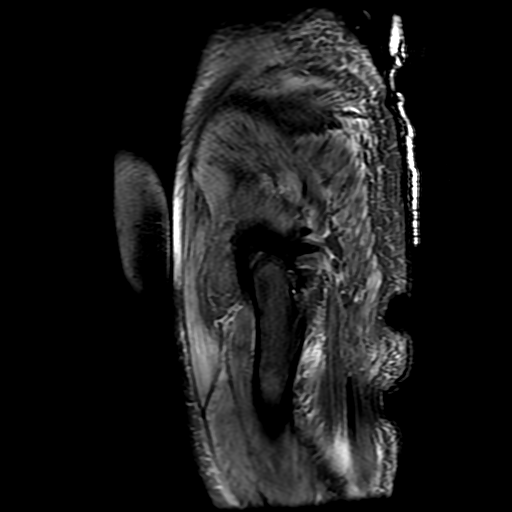

[18 of 48 positions shown; findings below may reference images not displayed]

FINDINGS: Again noted are decubitus ulcers over the ischial tuberosities,
right larger than left. The right decubitus ulcer extends down to
the ischial tuberosity with underlying marrow edema and cortical
irregularity consistent with osteomyelitis. There is mild diffuse
edema of the pelvic musculature with mild enhancement. There are
interval development of multiple small nonenhancing fluid
collections within the right adductor magnus and adductor brevis
muscles extending from the right ischial tuberosity most consistent
with intramuscular abscesses. The conglomeration of the area
measures approximately 4.8 x 2 cm, but each individual collection
measures significantly smaller than this. There is a rind of
nonenhancement around the wound consistent with necrosis.

There is no marrow signal abnormality involving the left ischial
tuberosity.

Bilateral hips demonstrate no fracture, dislocation or avascular
necrosis. There is a small right hip joint effusion with mild
synovial enhancement. There is no bursa formation. There is
generalized edema within the subcutaneous fat throughout the pelvis.
IMPRESSION: 1. Again noted is a decubitus ulcer over the ischial tuberosities
bilaterally, right larger than left. The right decubitus ulcer
extends down to the ischial tuberosity with underlying marrow edema,
cortical irregularity and enhancement consistent with osteomyelitis
similar in appearance to the prior exam. There is mild diffuse edema
throughout the pelvic musculature with mild enhancement with
interval development of small nonenhancing fluid collections within
the right adductor magnus and adductor brevis muscles extending from
the right ischial tuberosity most consistent with intramuscular
abscesses.
2. Small right joint effusion with mild synovial enhancement. Septic
arthritis cannot be excluded. If there is clinical concern recommend
arthrocentesis.
3. Decubitus ulcer overlying the left ischial tuberosity without
underlying osseous abnormality.
4. There is diffuse mild edema of the pelvic musculature which is
likely neurogenic. There is a concern of an element of infectious
myositis especially in the right adductor and gluteal compartment.

## 2017-01-23 IMAGING — RF DG FLUORO GUIDE NDL PLC/BX
1 series · 1 of 1 positions shown · non-contrast
Comparison: none

CLINICAL DATA: RIGHT HIP ASPIRATION INFECTION. PATIENT HAS
DECUBITUS ULCERS.

[Series 1: run · 1 of 1 slices shown]
[im 1/1]
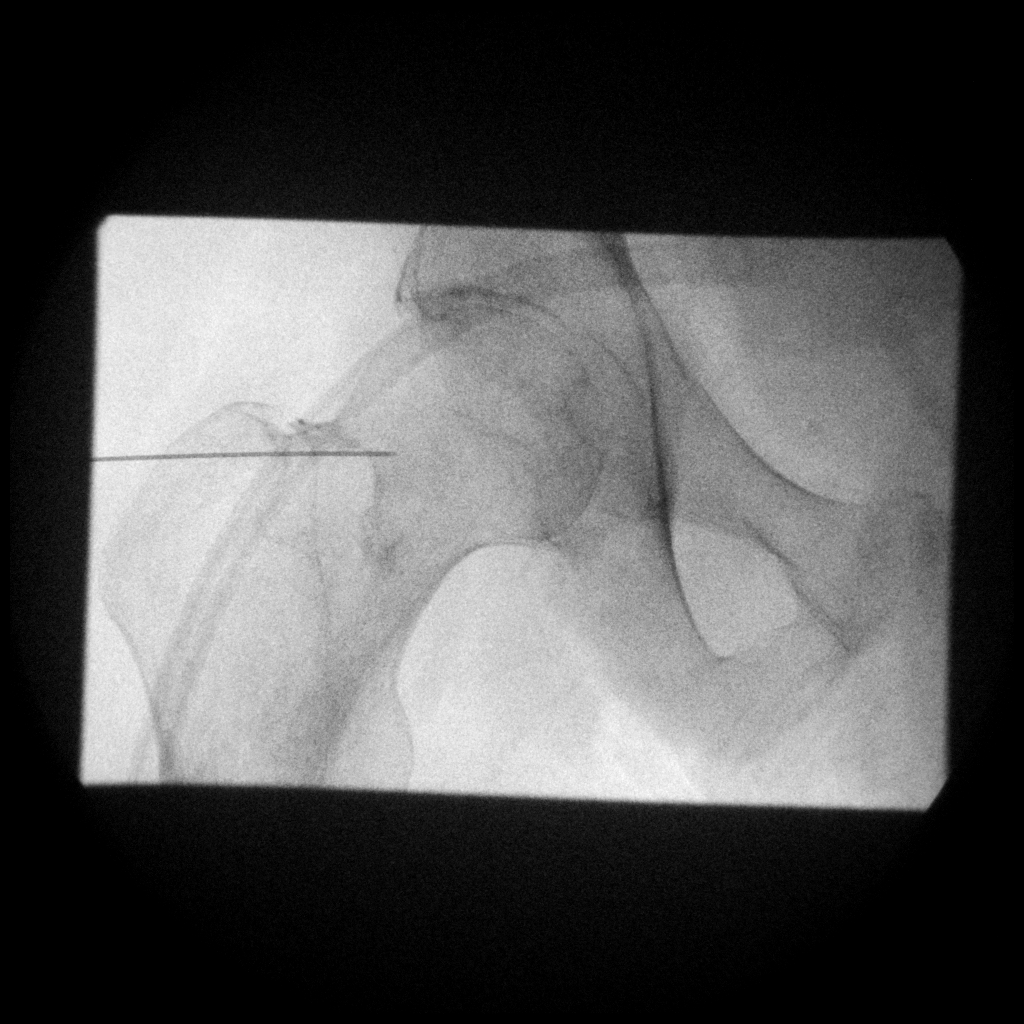

[1 of 1 positions shown; findings below may reference images not displayed]

EXAM:
RIGHT HIP ASPIRATION UNDER FLUOROSCOPY

FLUOROSCOPY TIME:  If the device does not provide the exposure
index:

Fluoroscopy Time (in minutes and seconds):  7 SECONDS

Number of Acquired Images:  0

PROCEDURE:
Overlying skin prepped with Betadine, draped in the usual sterile
fashion, and infiltrated locally with buffered Lidocaine. Curved 22
gauge spinal needle advanced to the superolateral margin of the
RIGHT femoral head. Diagnostic injection of iodinated contrast
demonstrates intra-articular spread without intravascular component.

Aspiration was attempted with no return of fluid. Lavage of the
right hip joint was performed with 5 mL of sterile saline with
subsequent aspiration of 1 mL of saline from the right hip joint.
The spinal needle was removed.

The patient tolerated the procedure well. There were no immediate
complications. Hemostasis was achieved.
IMPRESSION: Technically successful right hip aspiration under fluoroscopy. 1 mL
of clear right hip joint aspirate was sent for laboratory analysis.

## 2017-01-23 IMAGING — CR DG CHEST 1V PORT
1 series · 1 of 1 positions shown · non-contrast
Comparison: 11/08/2014

CLINICAL DATA: Subsequent encounter for pneumonia.

EXAM:
PORTABLE CHEST - 1 VIEW

[AP]
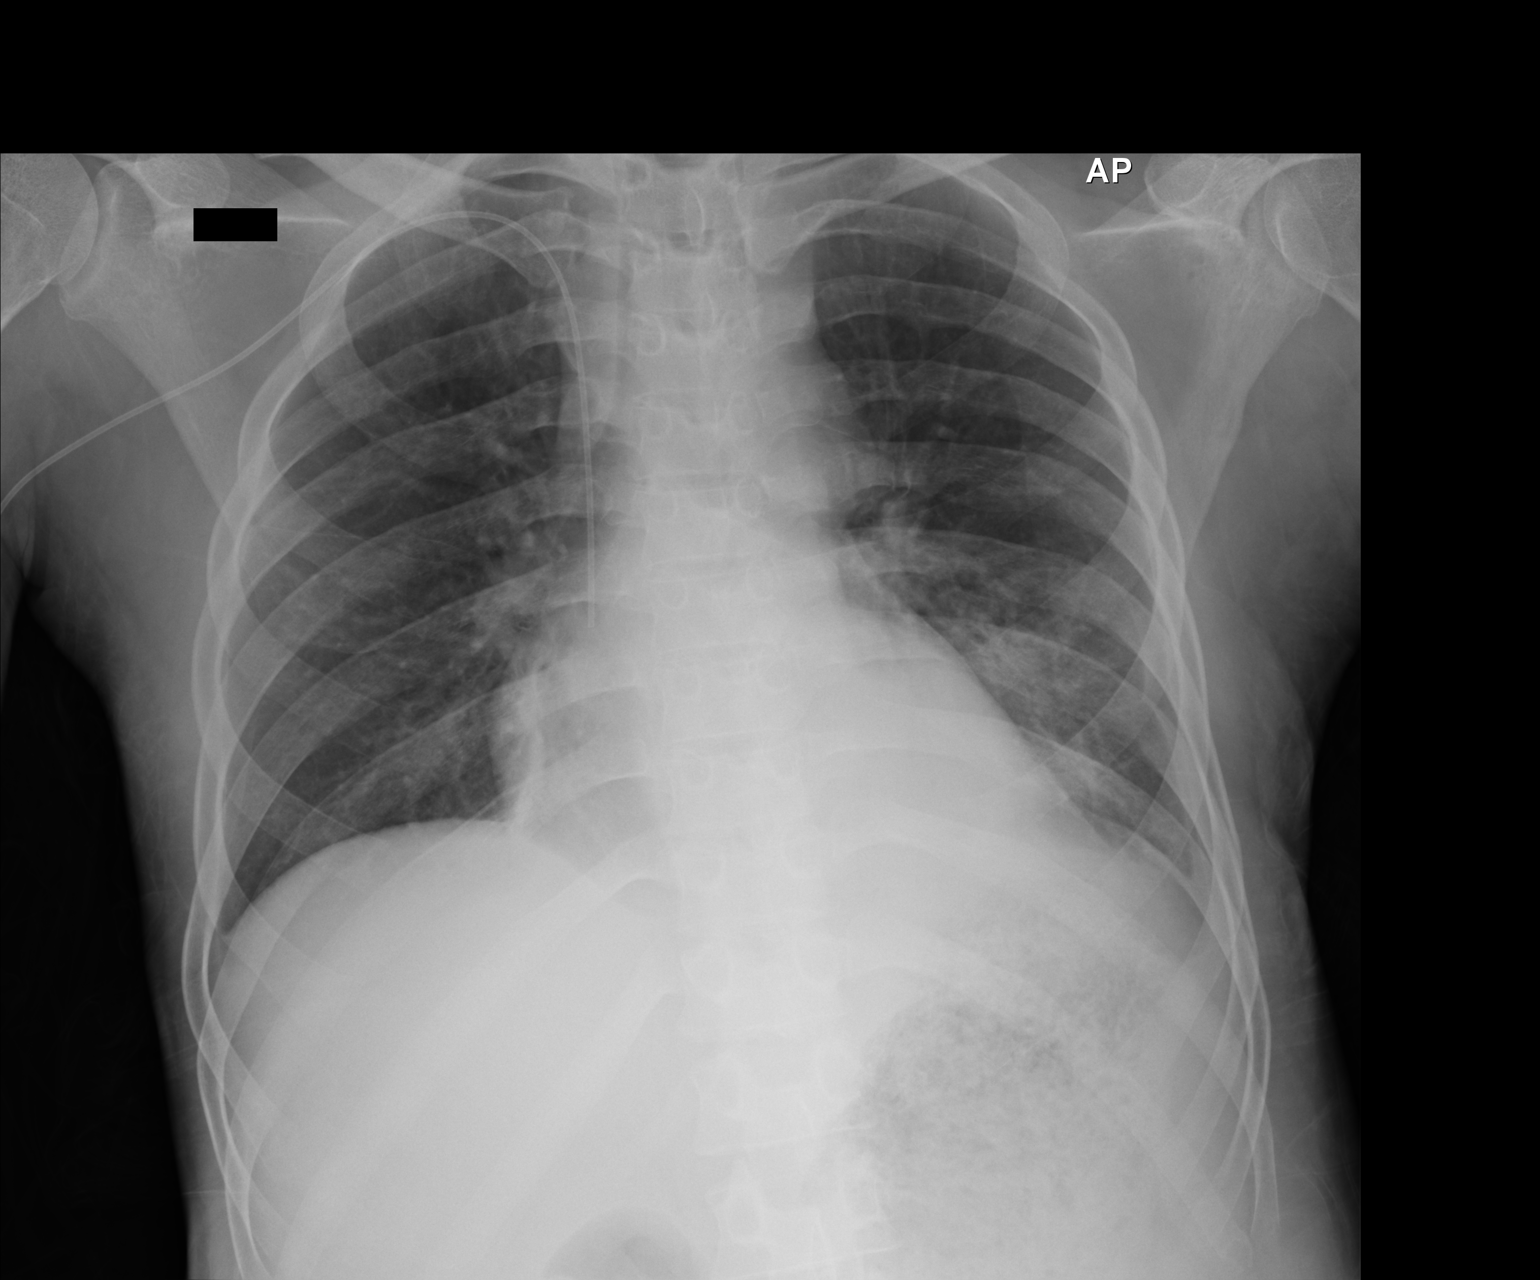

[1 of 1 positions shown; findings below may reference images not displayed]

FINDINGS: The right PICC line is stable. The tip is near the cavoatrial
junction. The cardiac silhouette, mediastinal and hilar contours are
within normal limits and stable. Persistent left lower lobe process,
likely pneumonia and small parapneumonic effusion. The right lung
remains clear.
IMPRESSION: Persistent left lower lobe process likely a combination of
infiltrate and effusion.

## 2017-01-23 IMAGING — CT CT CHEST W/ CM
1 of 3 series · 4 of 36 positions shown, 5 images · IV contrast (Iodine)
Comparison: Chest 11/15/2014.

CLINICAL DATA: Pleural effusion. Left lower lobe opacity on chest
radiograph. Increasing white count on appropriate antibiotics.

EXAM:
CT CHEST WITH CONTRAST
TECHNIQUE: Multidetector CT imaging of the chest was performed during
intravenous contrast administration.
CONTRAST:  80mL OMNIPAQUE IOHEXOL 300 MG/ML  SOLN

[Series 204: coronal (id) · coronal · 0.50mm/px · 4 of 79 slices shown, 5 images]
[im 16/79  mediastinal]
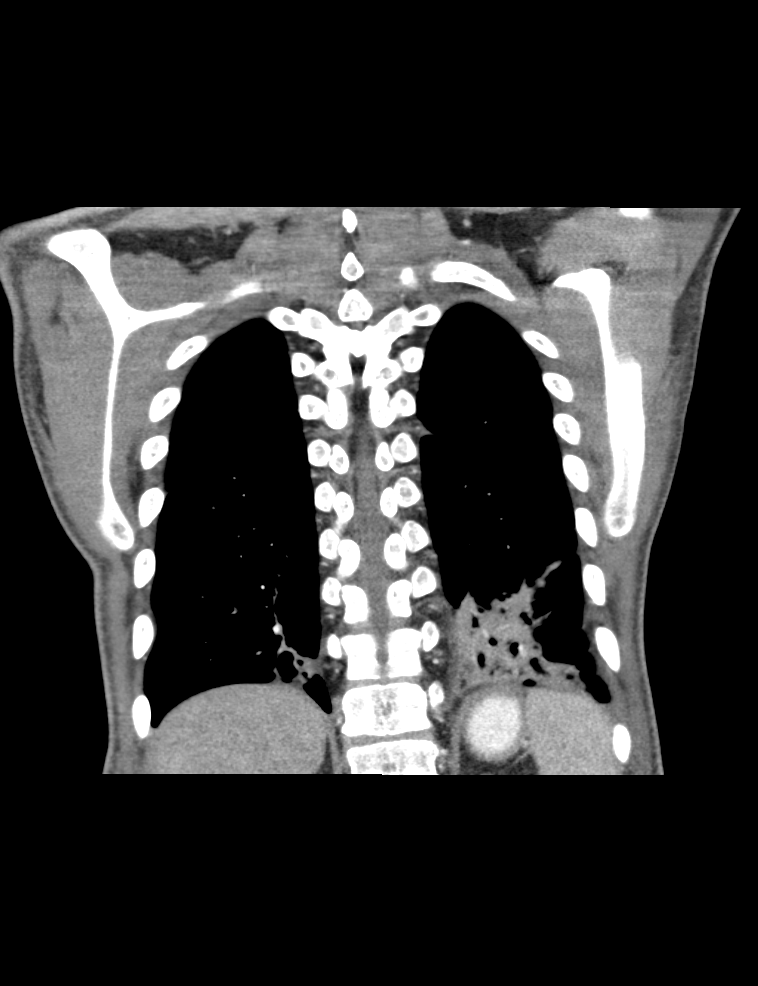
[im 16/79  lung]
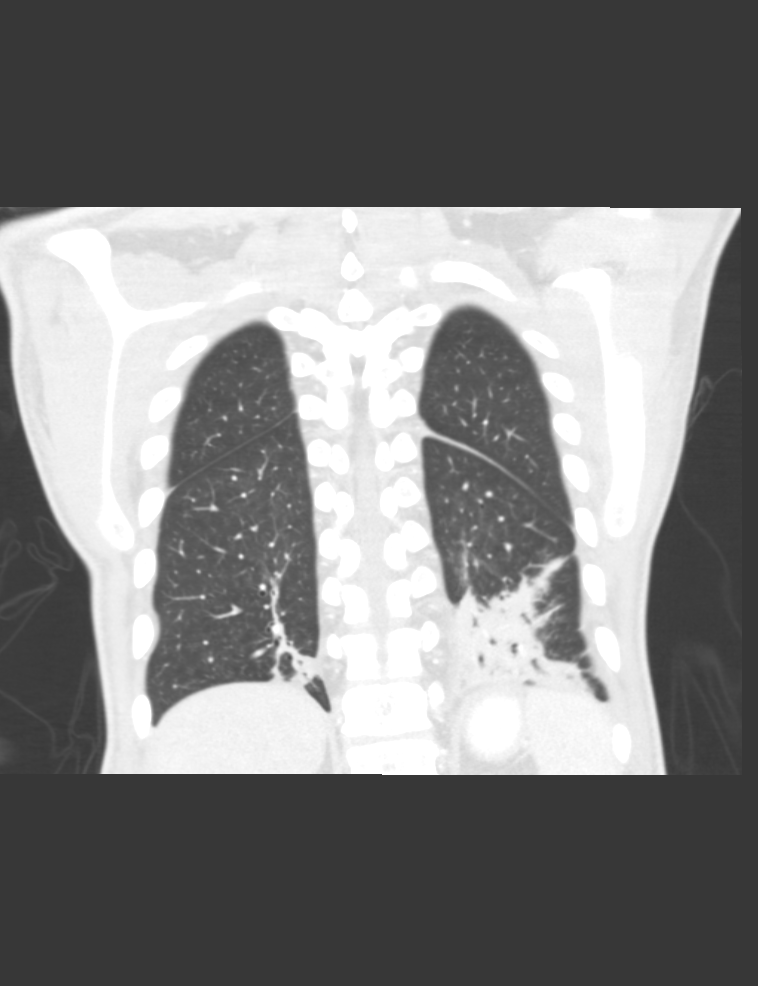
[im 32/79  lung]
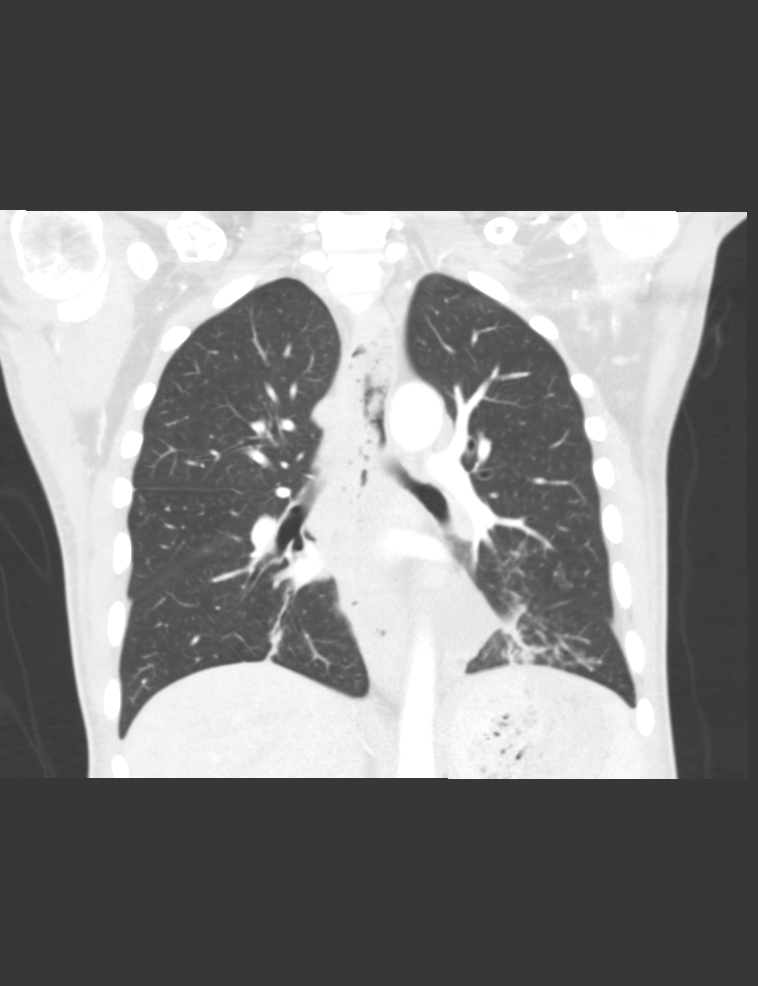
[im 47/79  lung]
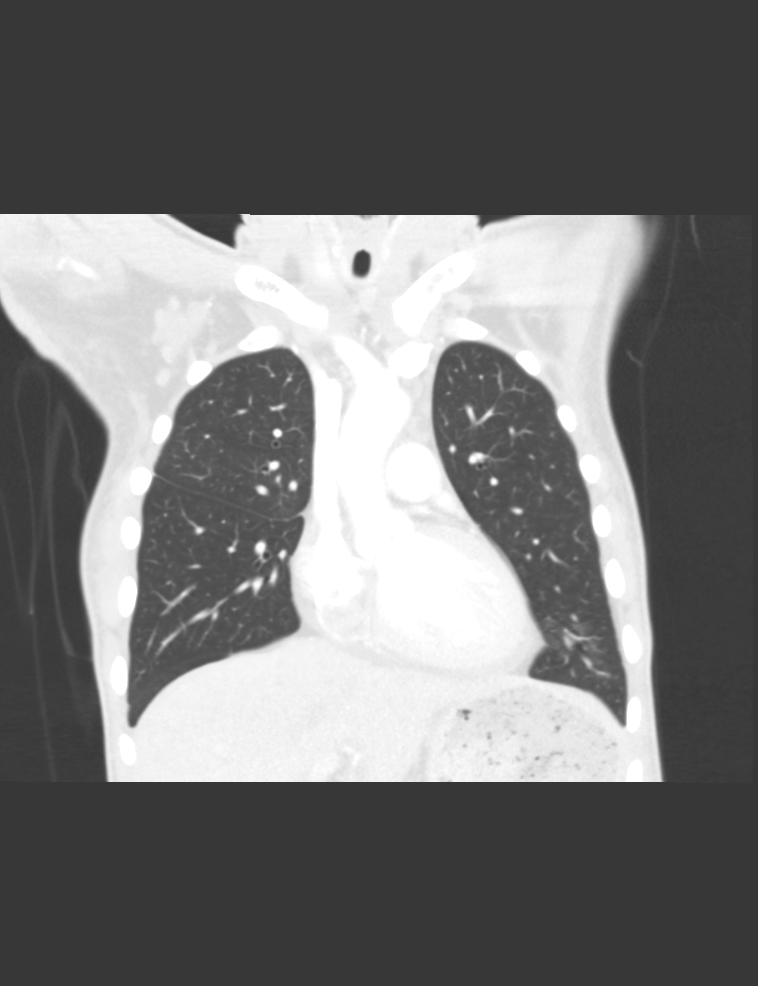
[im 63/79  lung]
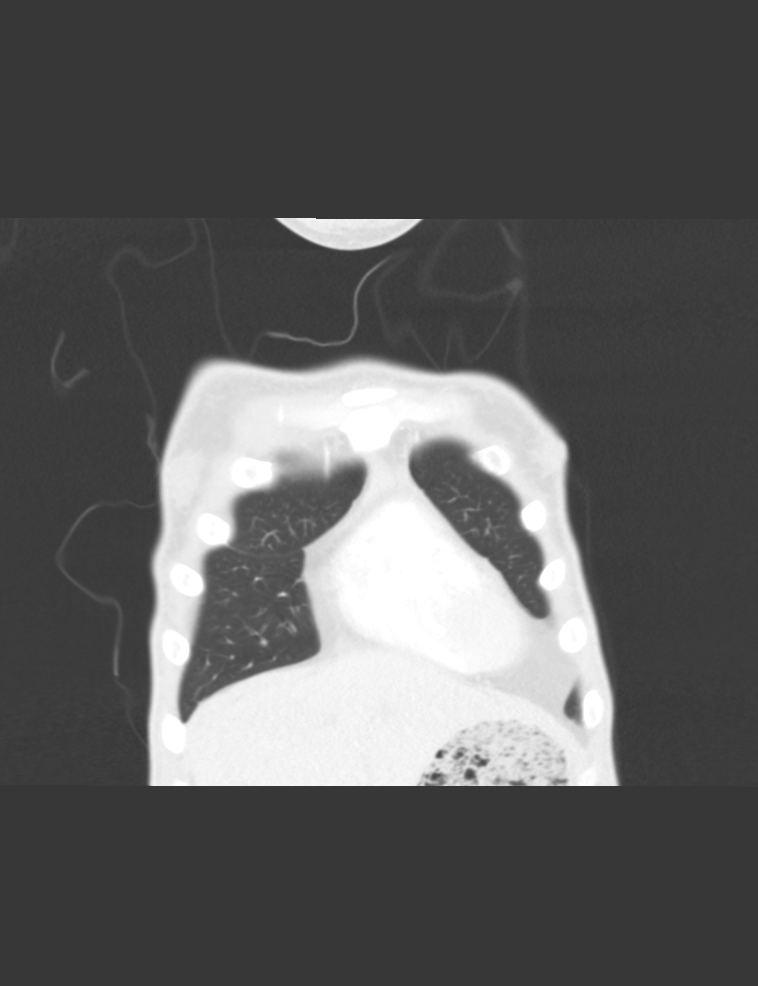

[4 of 36 positions shown; findings below may reference images not displayed]

FINDINGS: Focal areas of consolidation in both lung bases, greater on the
left, with small left pleural effusion. No evidence of loculated
fluid to suggest empyema. Air bronchograms are present with
suggestion of mild bronchiectasis. Findings likely represent
multifocal pneumonia. No pneumothorax.

Normal heart size. Normal caliber thoracic aorta. Great vessel
origins are patent. No significant lymphadenopathy in the chest.
Esophagus is mildly dilated with fluid and debris demonstrated
throughout the esophagus. This may be due to reflux or dysmotility.

Included portions of the upper abdominal organs are grossly
unremarkable. No destructive bone lesions.
IMPRESSION: Focal areas of consolidation in both lung bases, greater on the
left. Small left pleural effusion. Findings likely to represent
pneumonia. No evidence of empyema. Mildly dilated esophagus filled
with fluid and debris. This could be due to reflux or dysmotility.

## 2017-01-25 IMAGING — CR DG HAND COMPLETE 3+V*L*
3 series · 3 of 3 positions shown · non-contrast
Comparison: None.

CLINICAL DATA: Nonhealing ulcer in left middle finger.

EXAM:
LEFT HAND - COMPLETE 3+ VIEW

[x hand pa left]
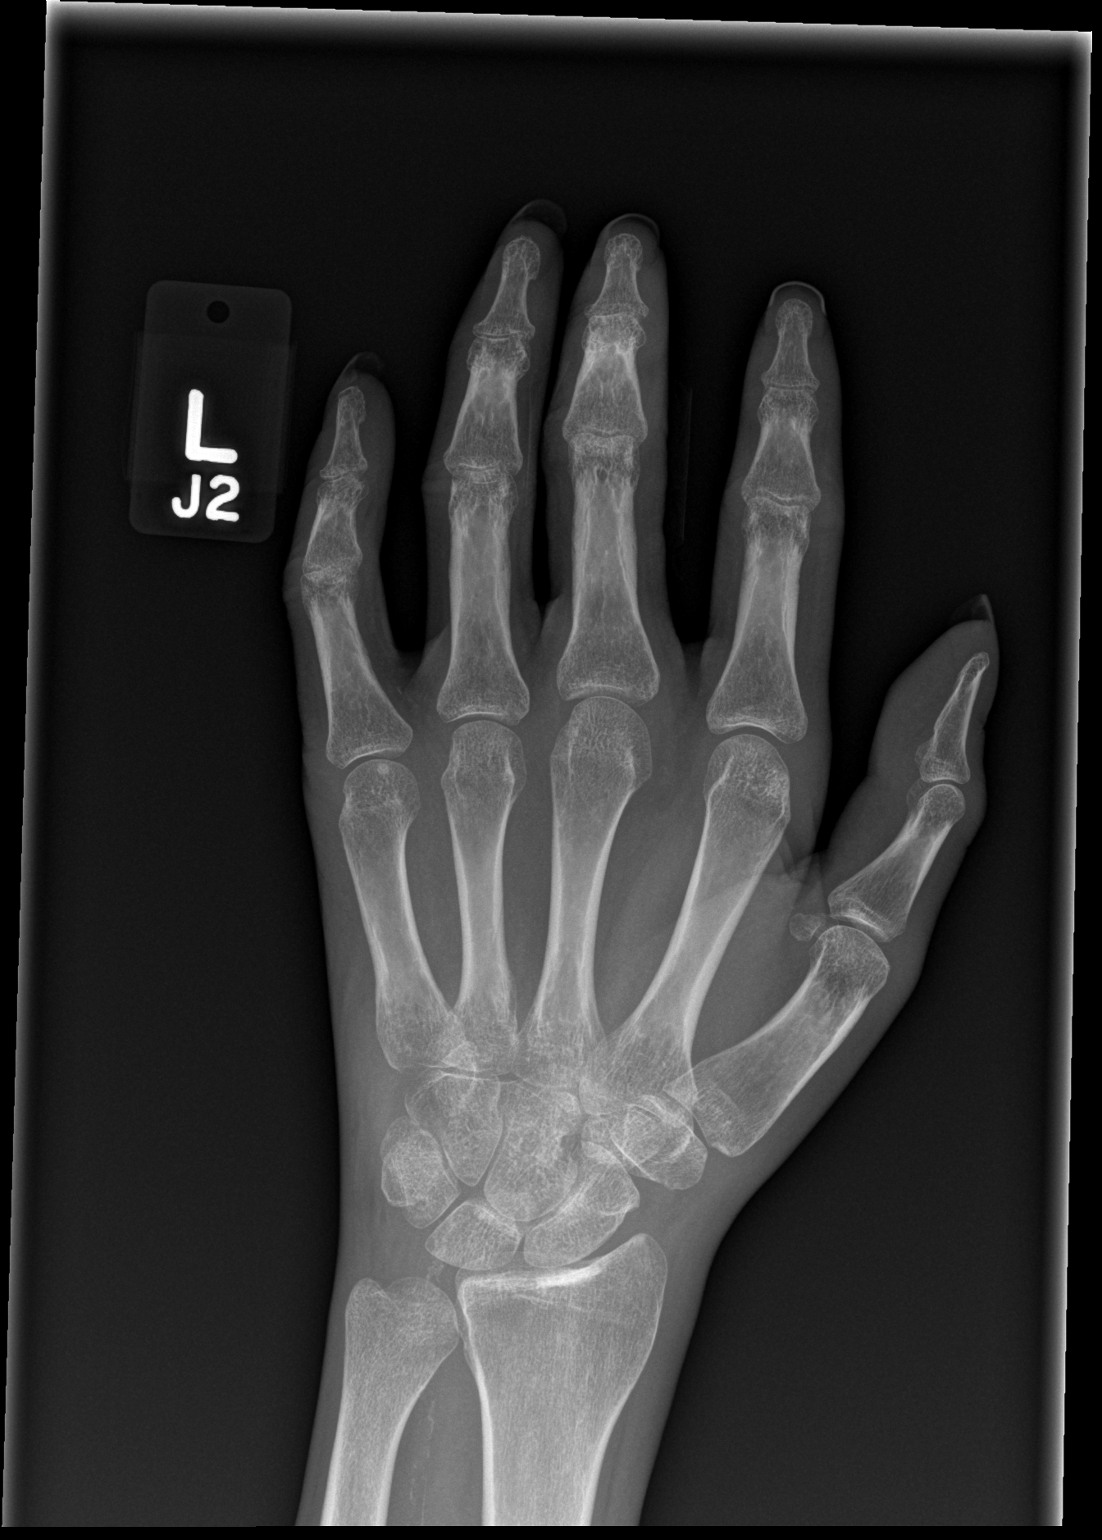

[x hand obl left]
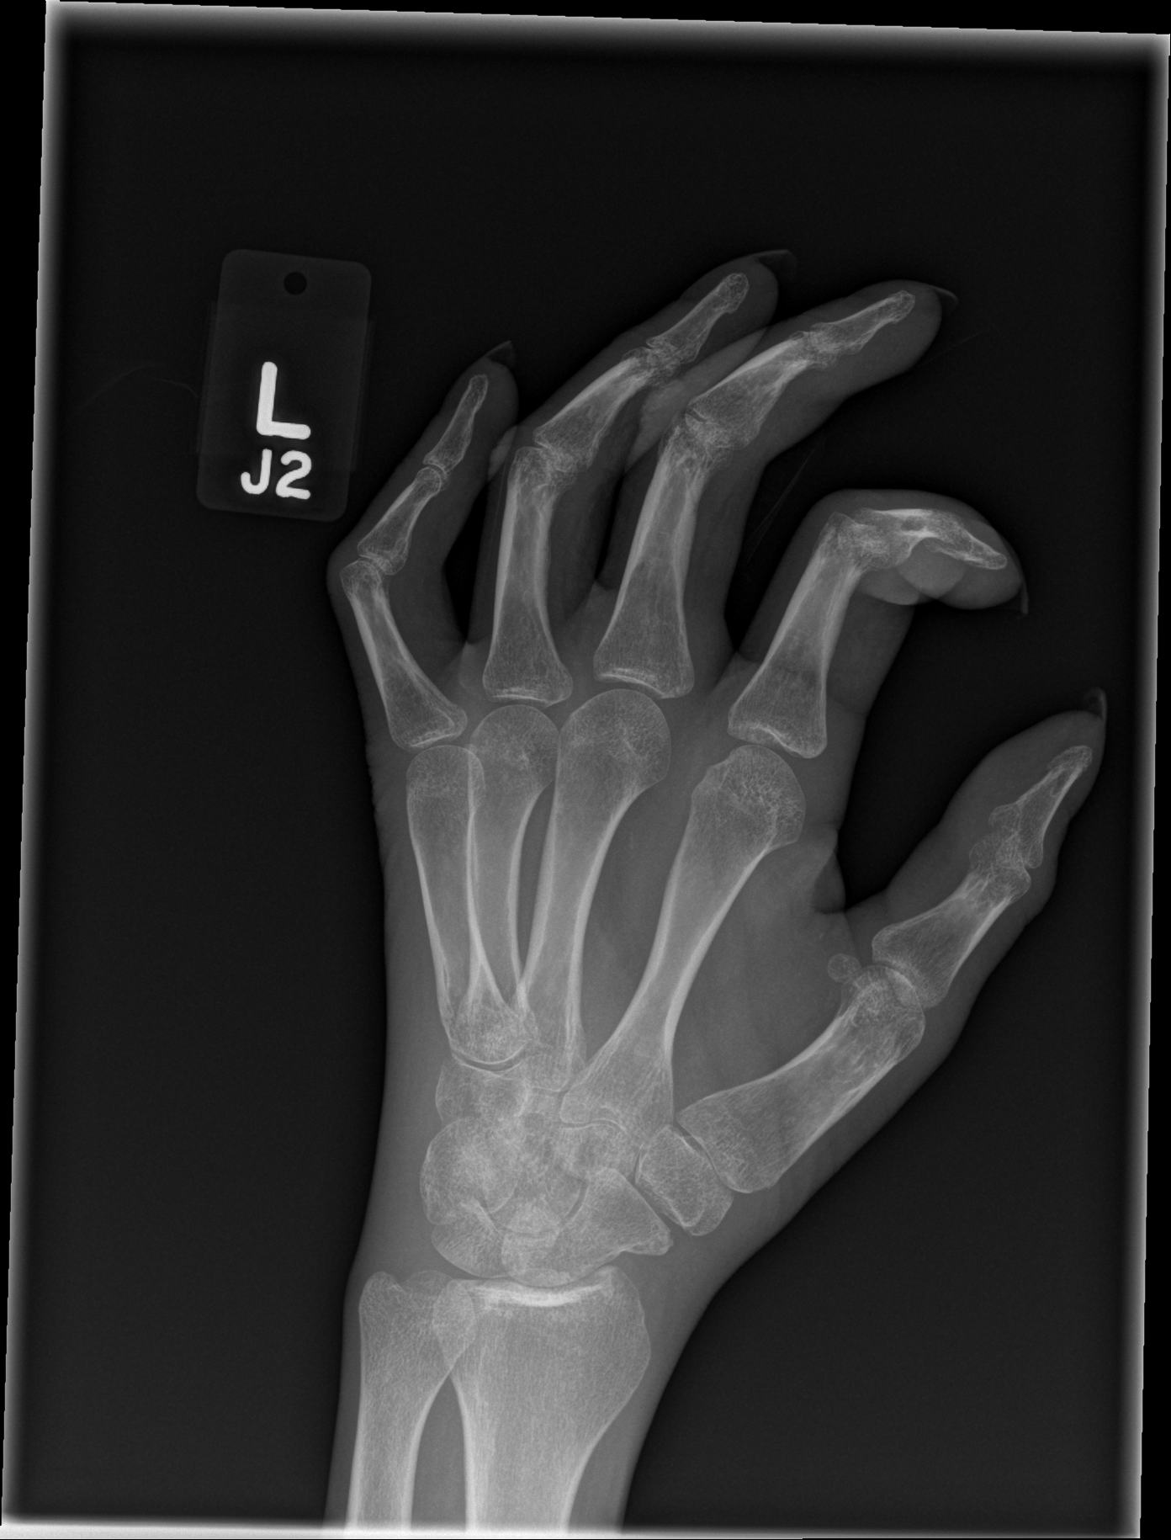

[x hand lat left]
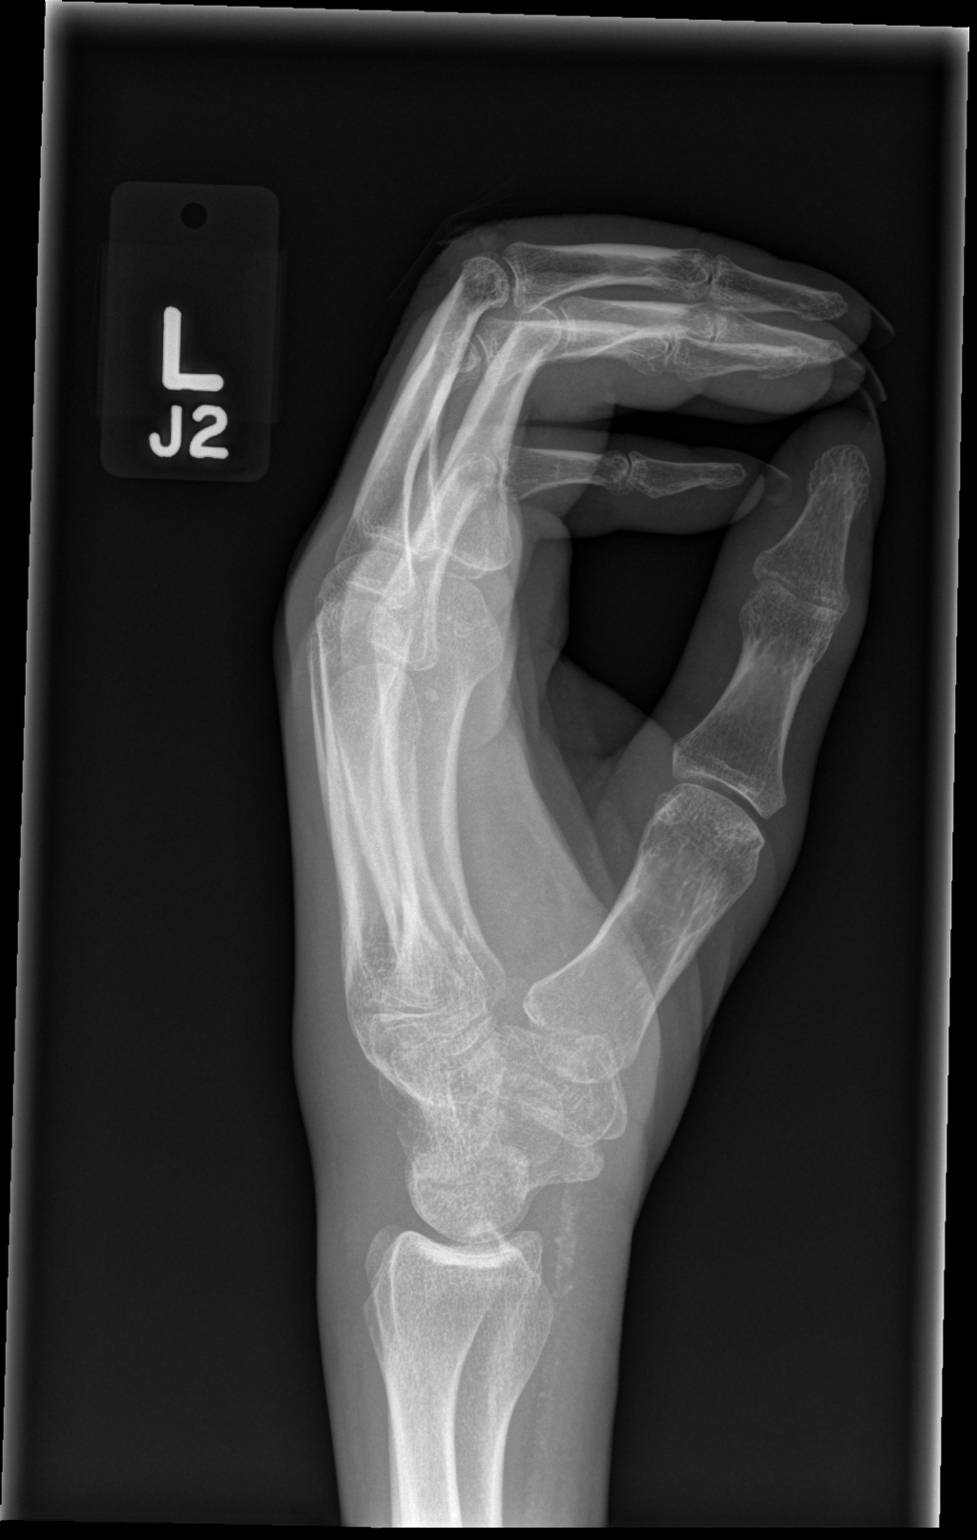

[3 of 3 positions shown; findings below may reference images not displayed]

FINDINGS: There is no evidence of fracture or dislocation. No lytic
destruction is seen to suggest osteomyelitis. There is no evidence
of arthropathy or other focal bone abnormality. Vascular
calcifications are noted.
IMPRESSION: No significant abnormality seen in the left hand.

## 2017-01-25 IMAGING — CR DG FOOT COMPLETE 3+V*R*
3 series · 3 of 3 positions shown · non-contrast
Comparison: None.

CLINICAL DATA: Diabetic ulcers.

EXAM:
RIGHT FOOT COMPLETE - 3+ VIEW

[x foot lat right]
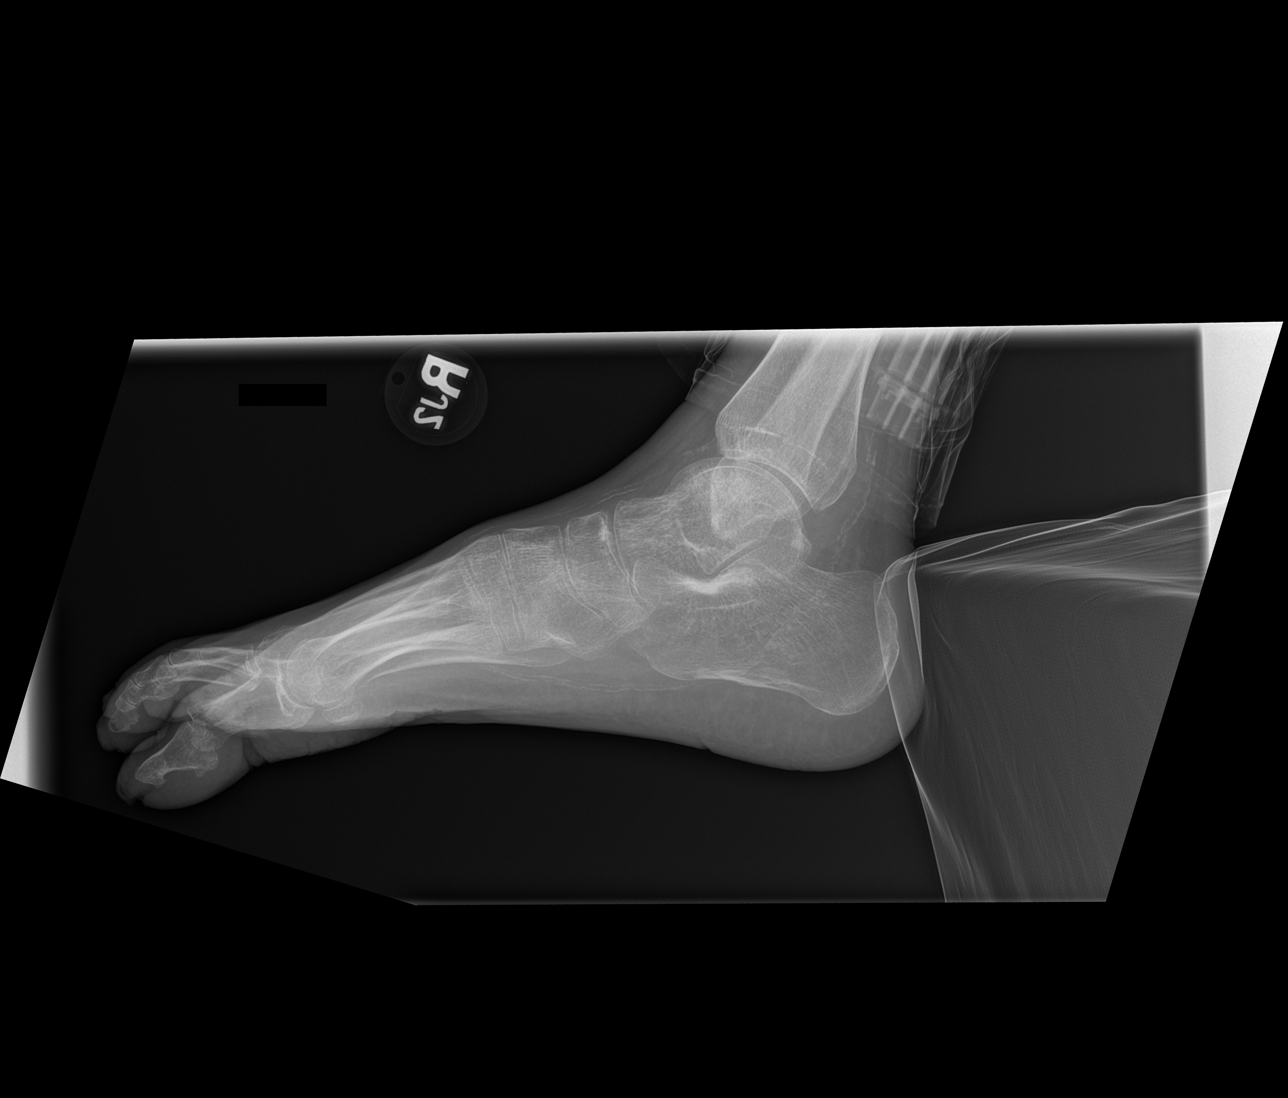

[x foot ap right]
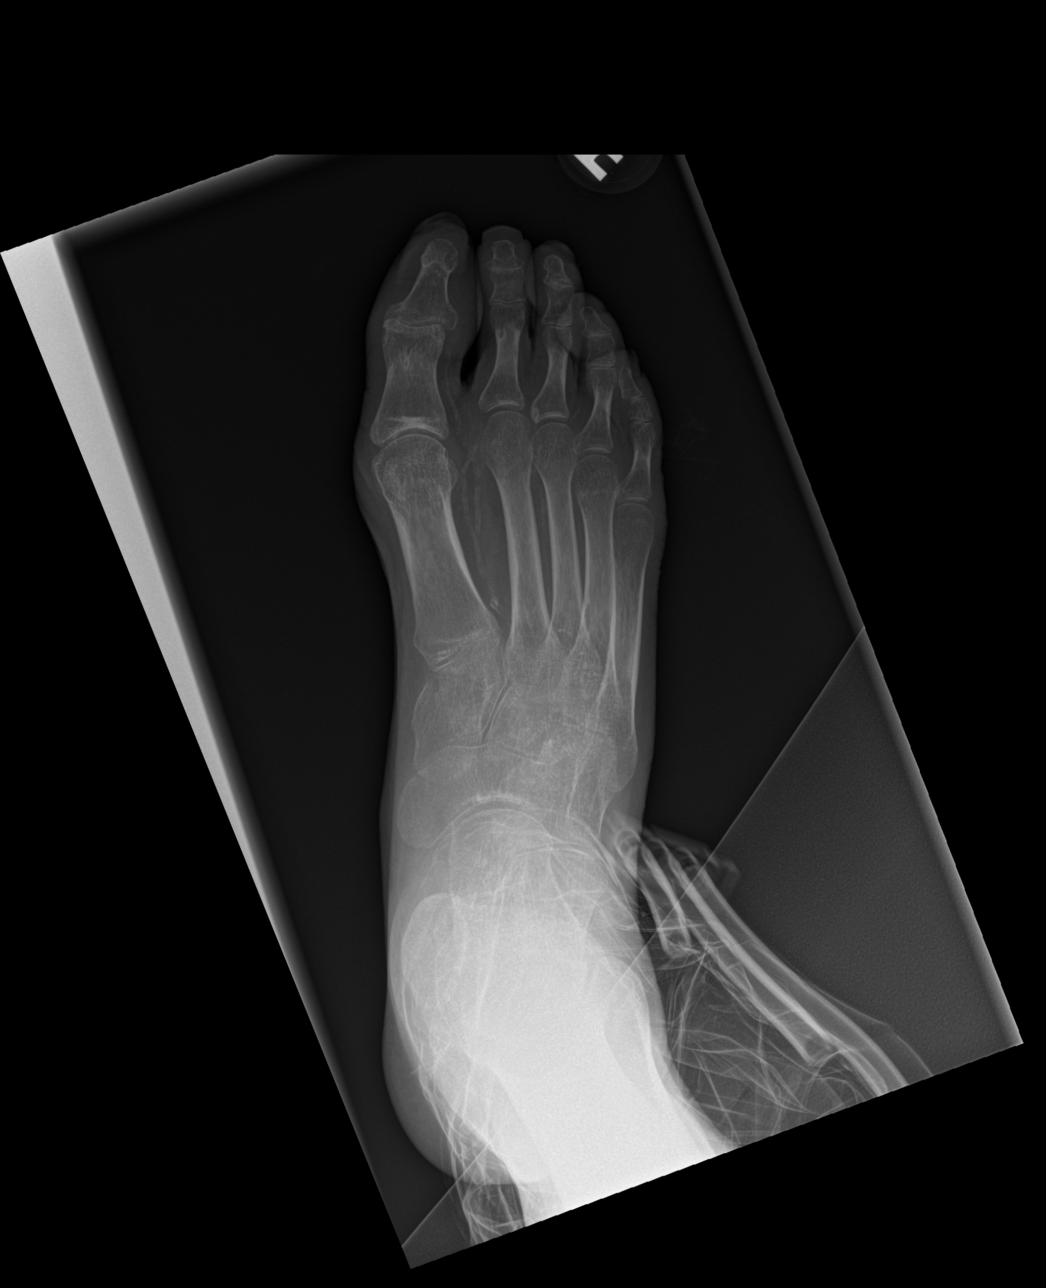

[x foot obl right]
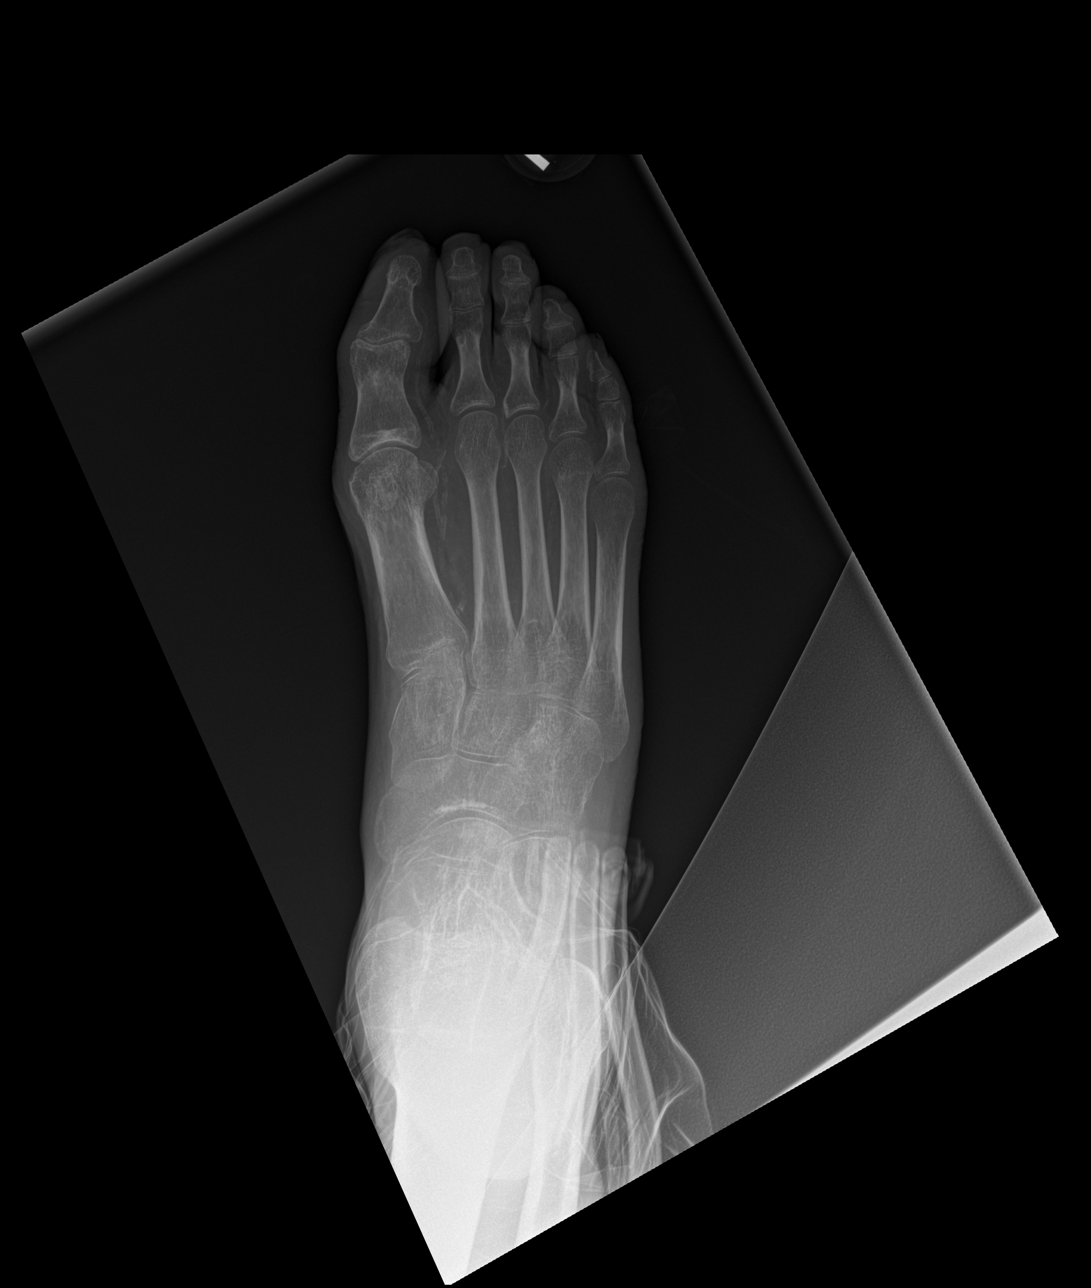

[3 of 3 positions shown; findings below may reference images not displayed]

FINDINGS: There is no evidence of fracture or dislocation. There is no
evidence of arthropathy or other focal bone abnormality. Vascular
calcifications are noted. No radiopaque foreign body is noted. No
lytic destruction is seen to suggest osteomyelitis.
IMPRESSION: No acute abnormality seen in the right foot.

## 2017-01-25 IMAGING — CR DG FOOT COMPLETE 3+V*L*
3 series · 3 of 3 positions shown · non-contrast
Comparison: None.

CLINICAL DATA: Nonhealing diabetic ulcers.

EXAM:
LEFT FOOT - COMPLETE 3+ VIEW

[x foot ap left]
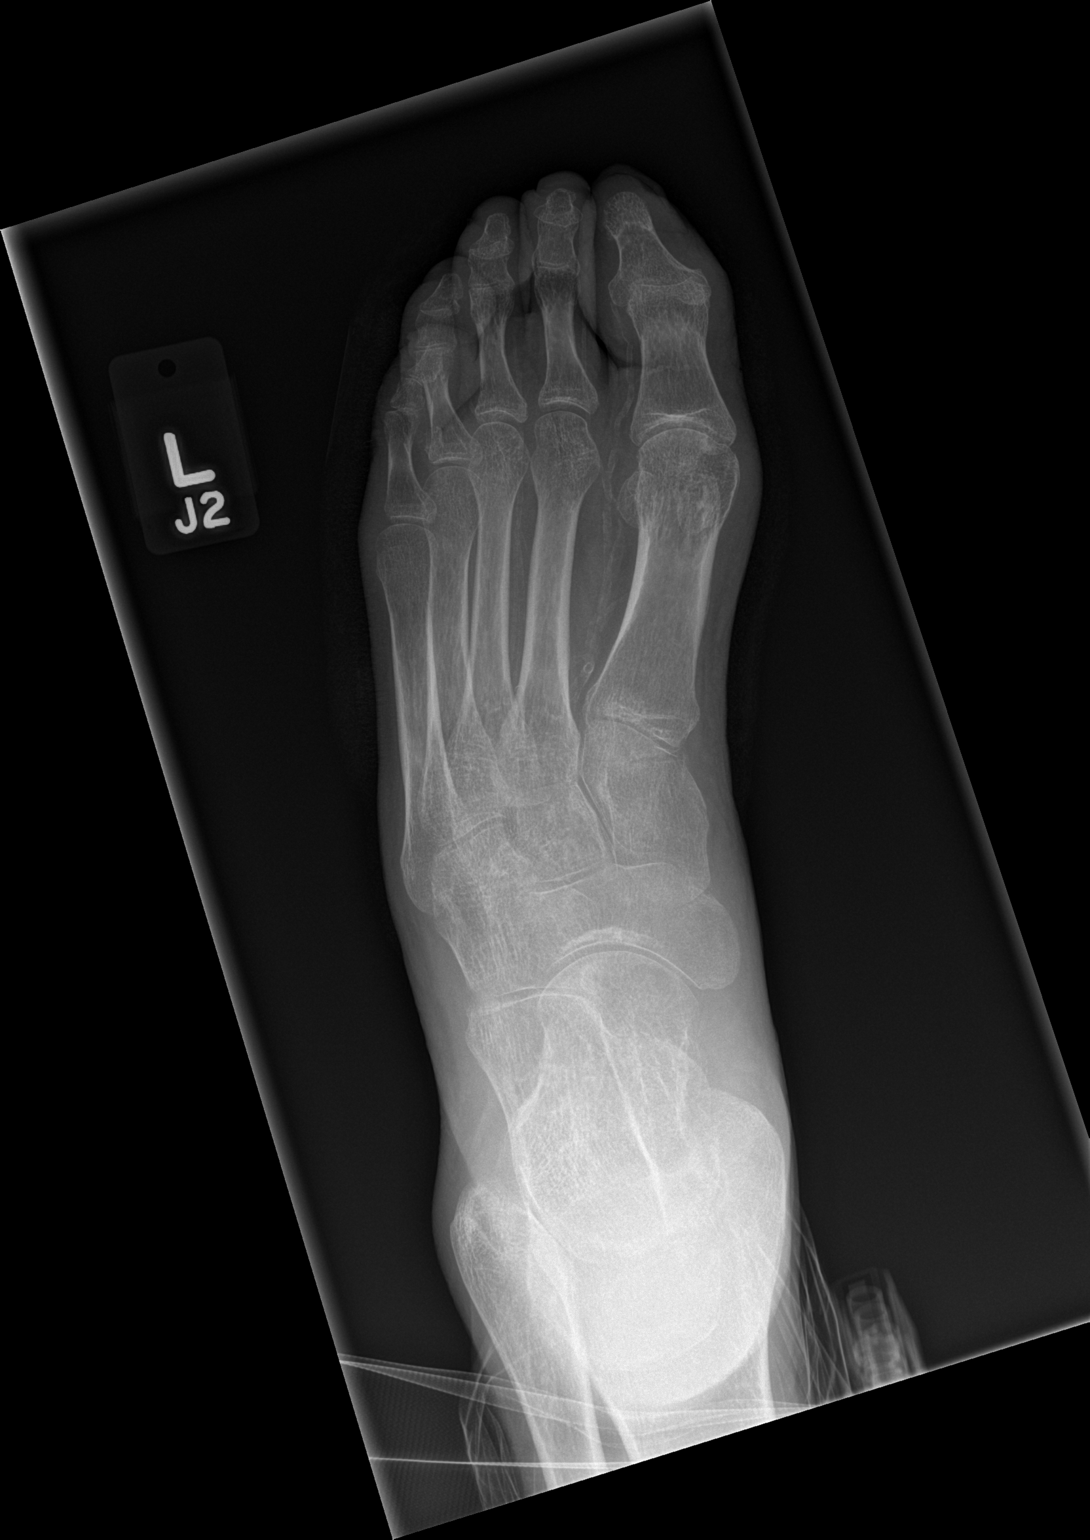

[x foot obl left]
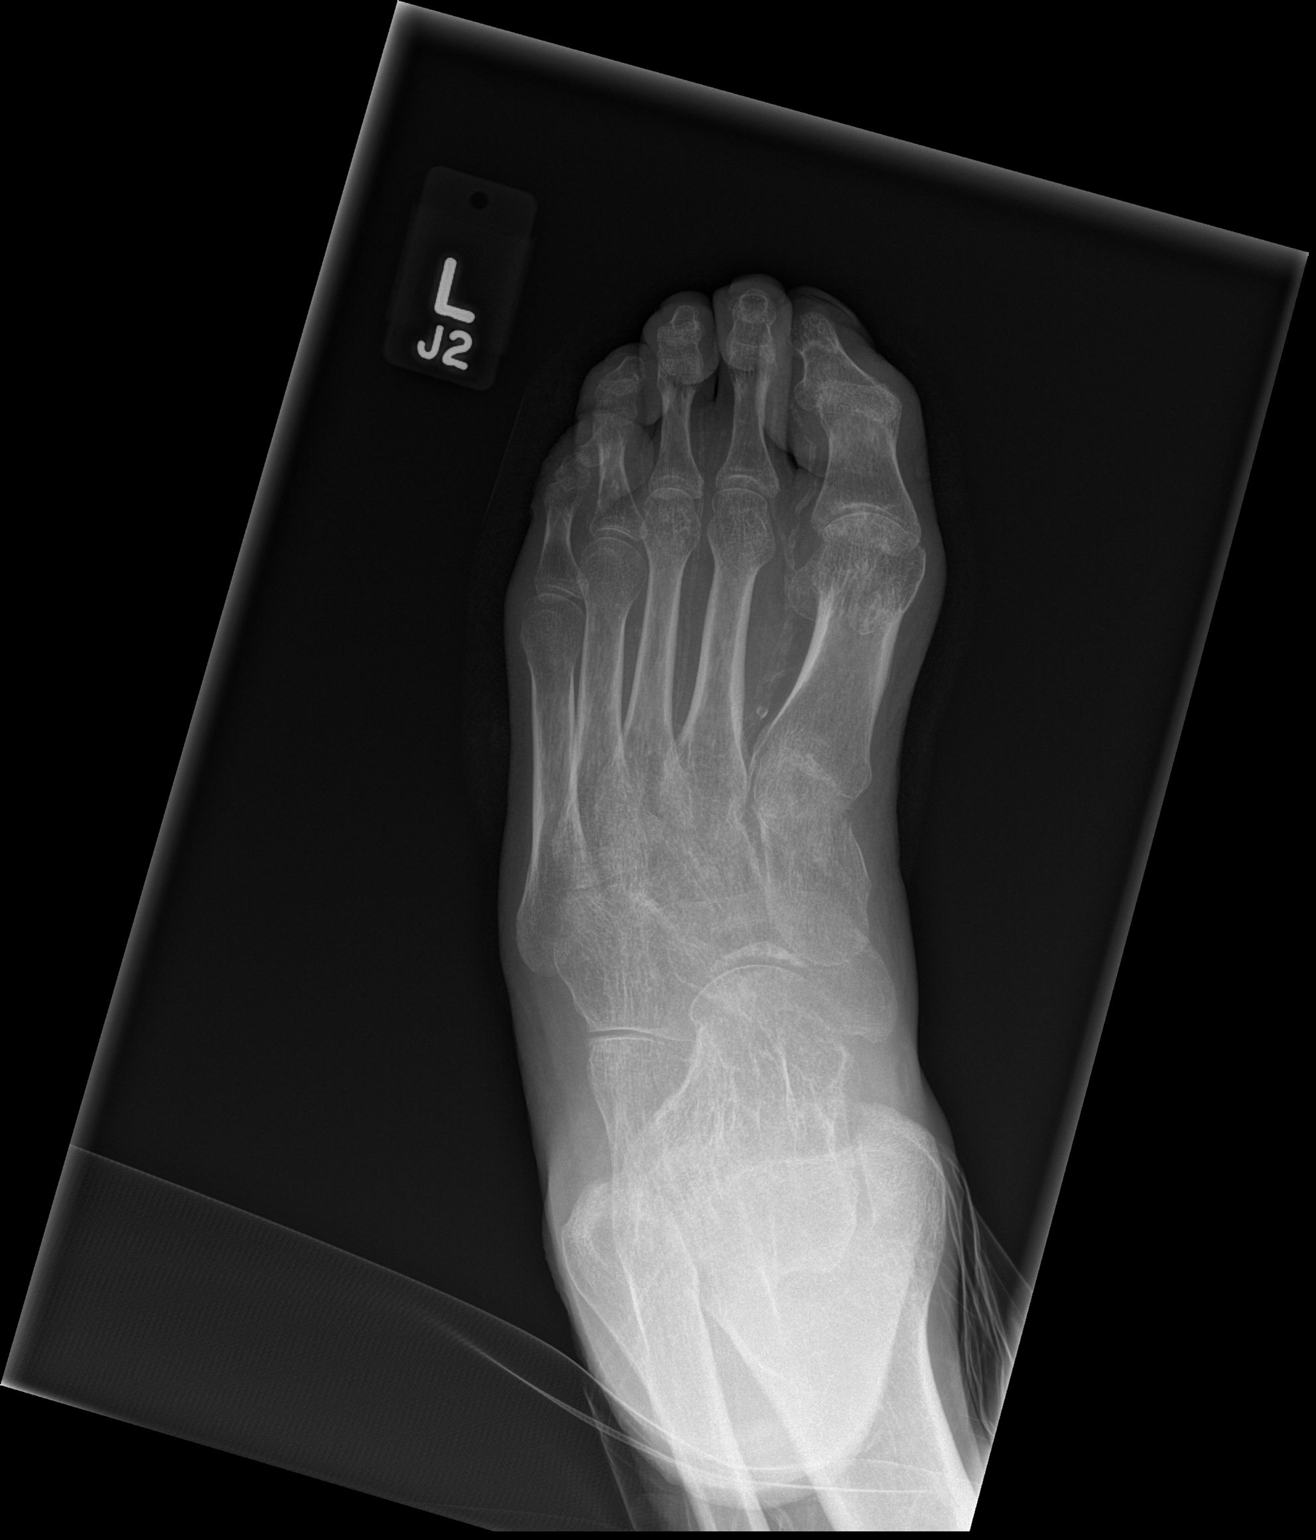

[x foot lat left]
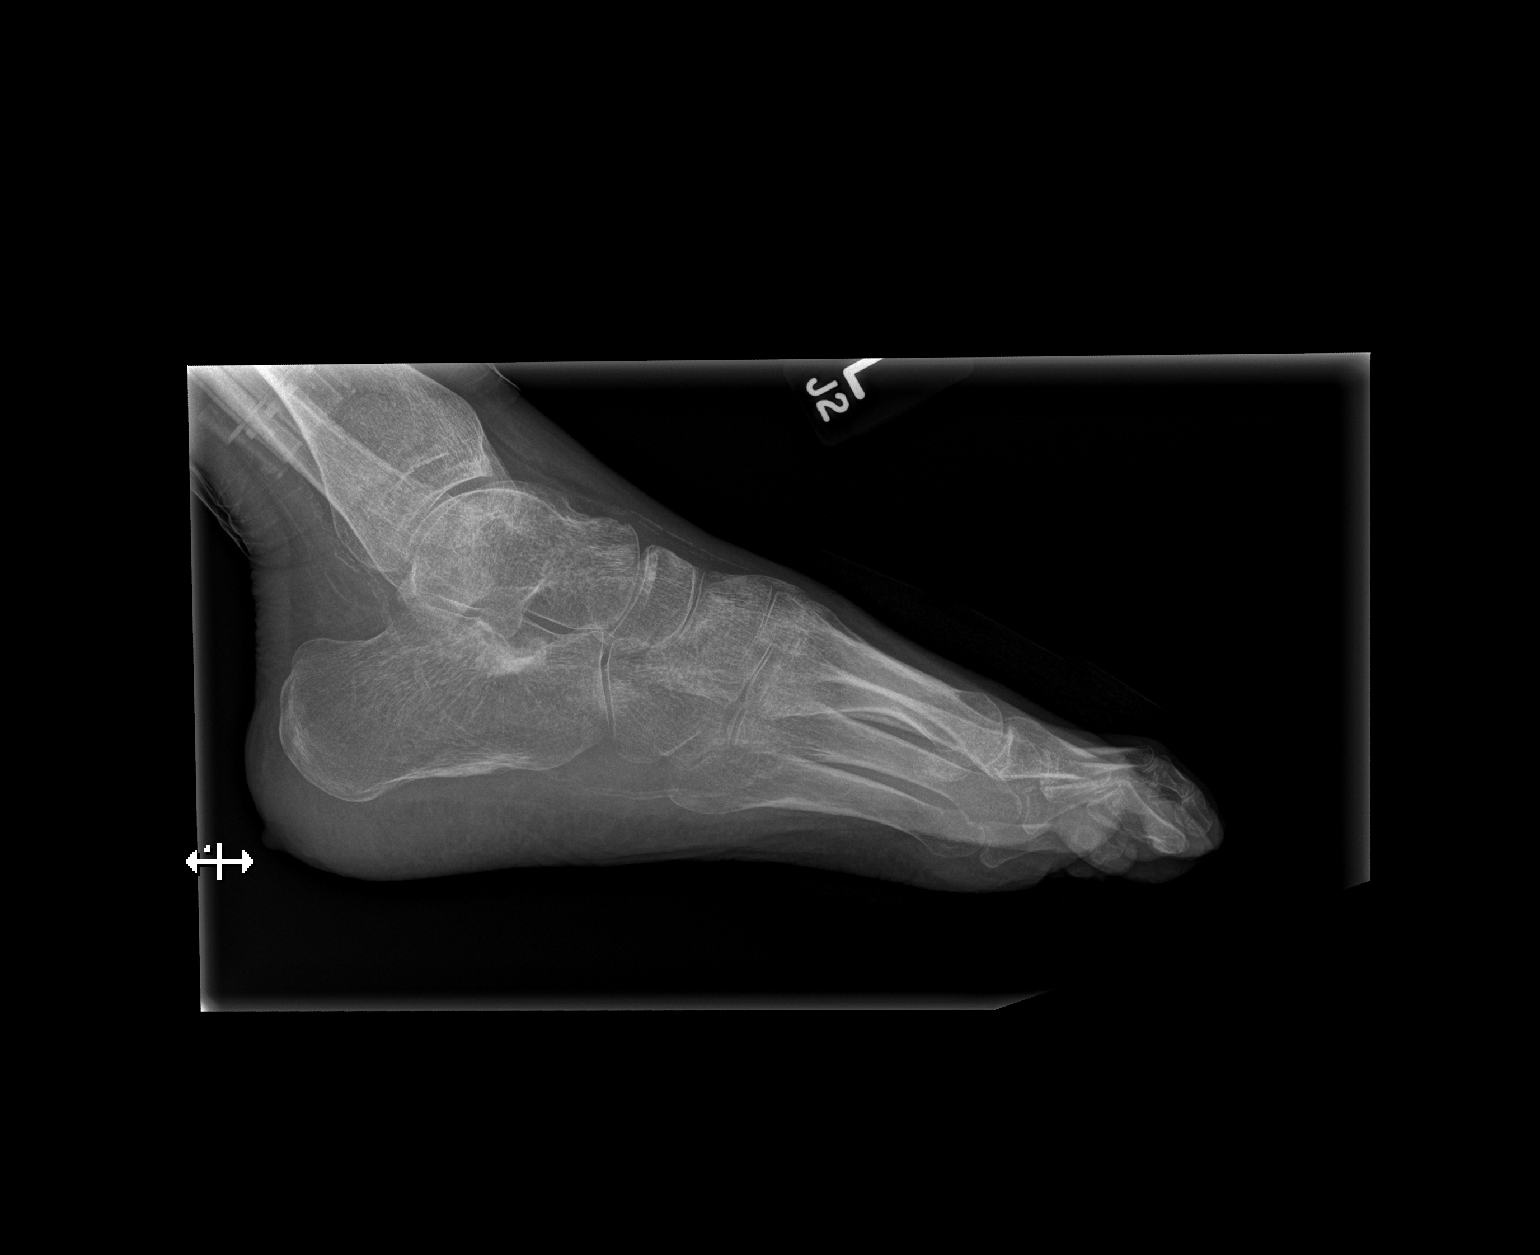

[3 of 3 positions shown; findings below may reference images not displayed]

FINDINGS: There is no evidence of fracture or dislocation. There is no
evidence of arthropathy or other focal bone abnormality. Vascular
calcifications are noted. No lytic destruction is seen to suggest
osteomyelitis.
IMPRESSION: No evidence of osteomyelitis.

## 2017-01-27 IMAGING — CR DG CHEST 1V PORT
1 series · 1 of 1 positions shown · non-contrast
Comparison: 11/15/2014

CLINICAL DATA: PICC line placement.  Pneumonia.

EXAM:
PORTABLE CHEST - 1 VIEW

[AP]
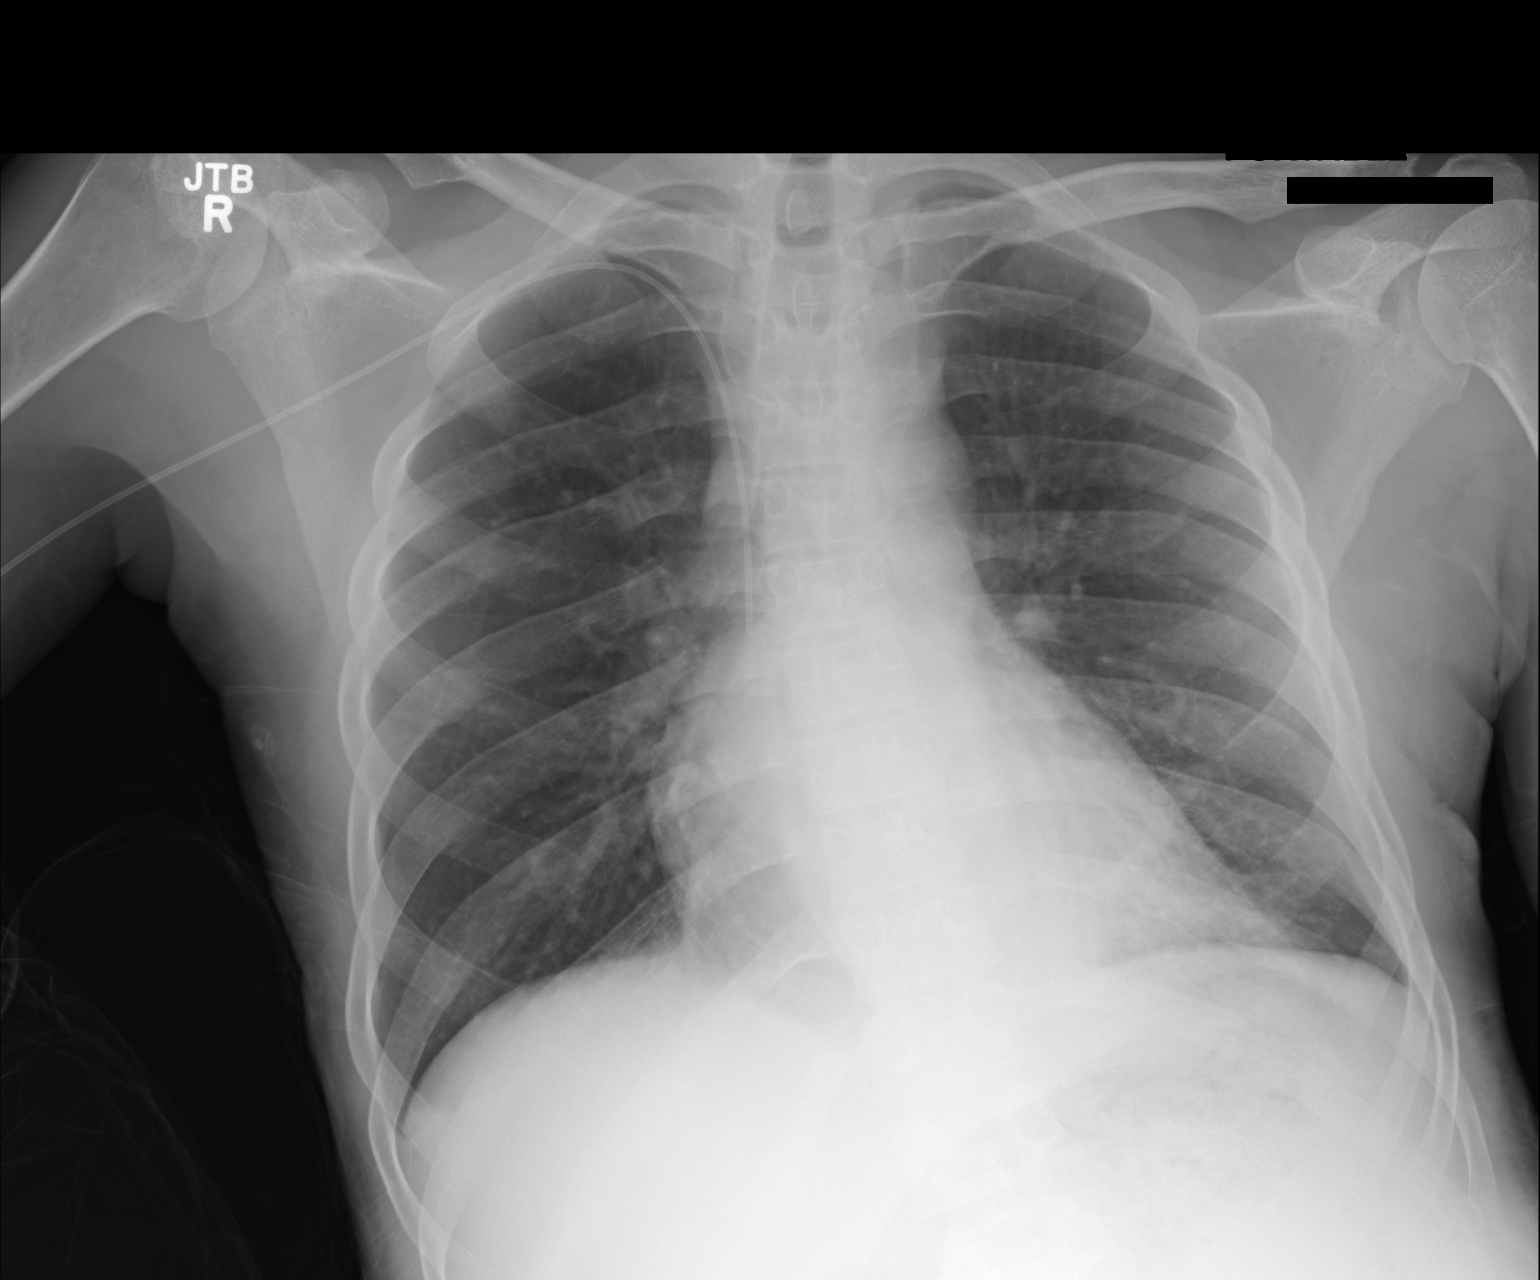

[1 of 1 positions shown; findings below may reference images not displayed]

FINDINGS: Right arm PICC line tip overlies the distal SVC.

Decreased airspace disease is seen in the left retrocardiac lung
base. There is mild persistent atelectasis or infiltrate seen in the
medial right lung base. No evidence of pleural effusion. Heart size
is normal.
IMPRESSION: Right arm PICC line tip overlies the distal SVC.

Decreased airspace disease in left retrocardiac lung base. Mild
atelectasis or infiltrate persists in medial right lung base.

## 2017-02-08 IMAGING — CT CT HEAD W/O CM
1 series · 16 of 30 positions shown, 20 images · non-contrast
Comparison: Head CT 07/29/2014, MRI 11 [DATE]

CLINICAL DATA: Allergic reaction to pain meds withdrawal. Pt has
had hallucinations and altered mental status x 3 days per mom

EXAM:
CT HEAD WITHOUT CONTRAST
TECHNIQUE: Contiguous axial images were obtained from the base of the skull
through the vertex without intravenous contrast.

[Series 2: head 4.8 h37s · axial · 0.44mm/px · z∈[-142,-6]mm · 16 of 32 slices shown, 20 images]
[im 2/32  brain]
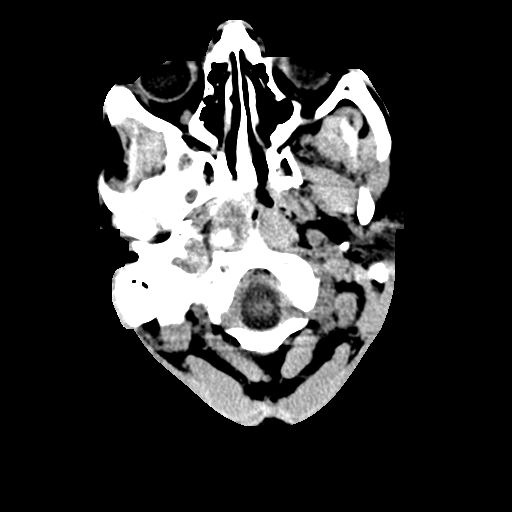
[im 2/32  bone]
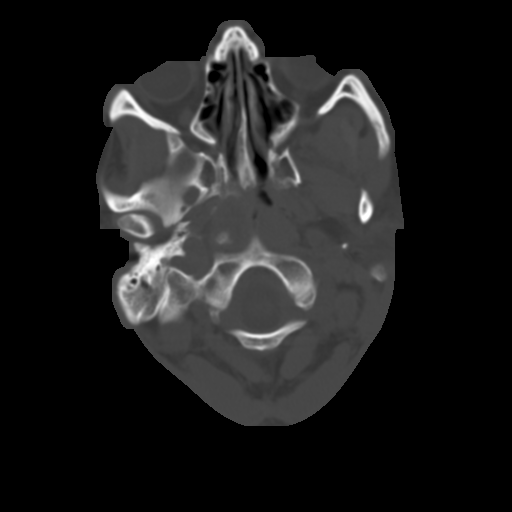
[im 4/32  brain]
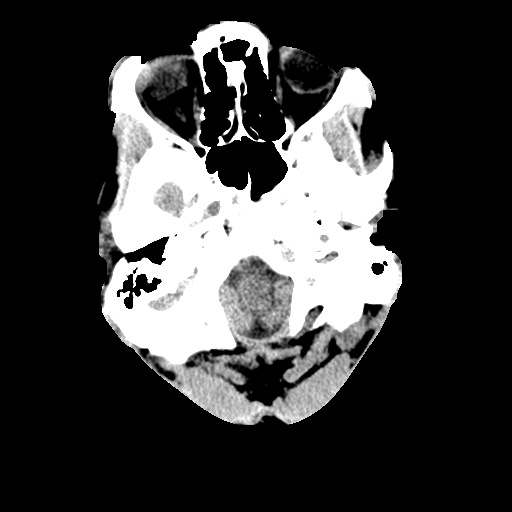
[im 6/32  brain]
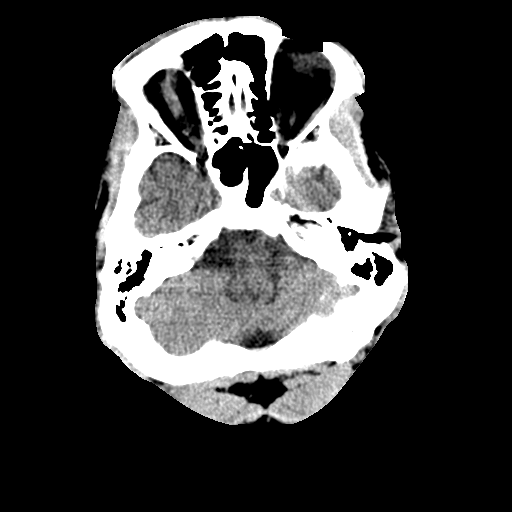
[im 8/32  brain]
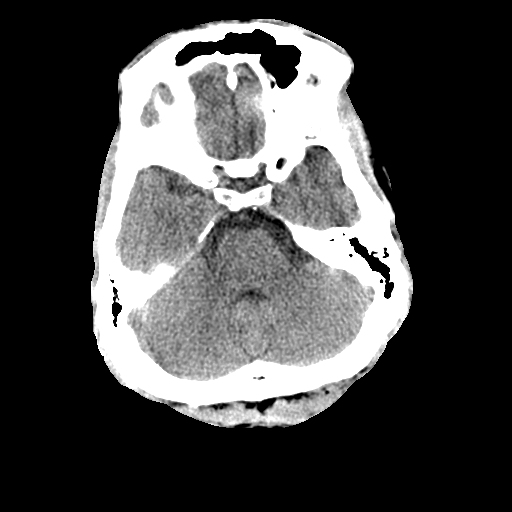
[im 9/32  brain]
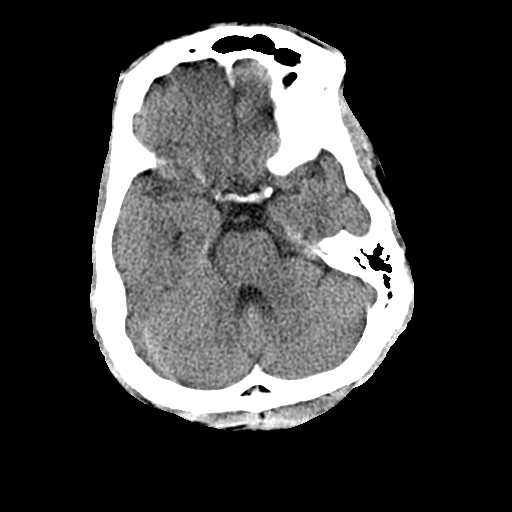
[im 9/32  bone]
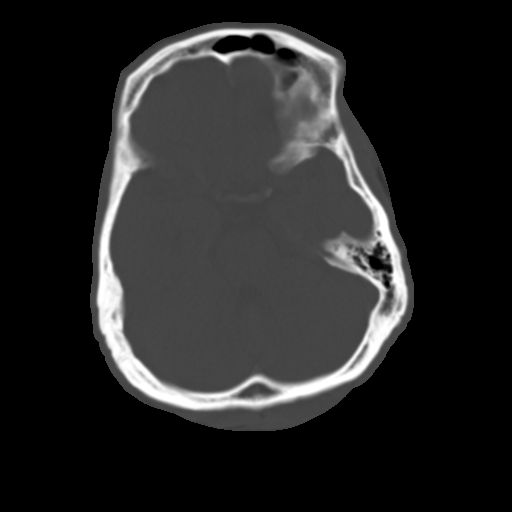
[im 11/32  brain]
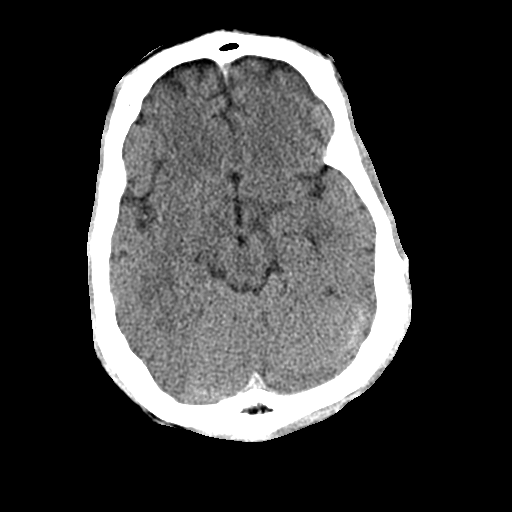
[im 13/32  brain]
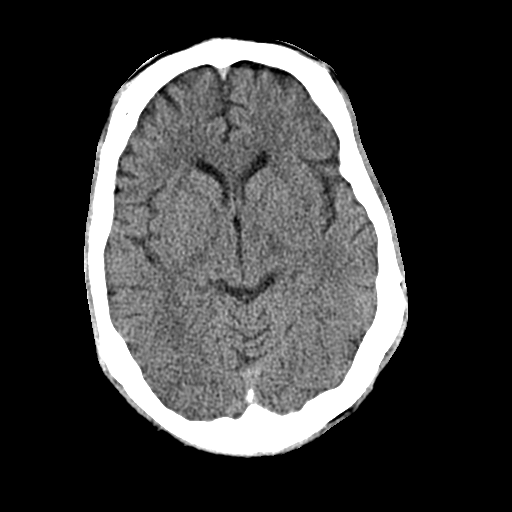
[im 15/32  brain]
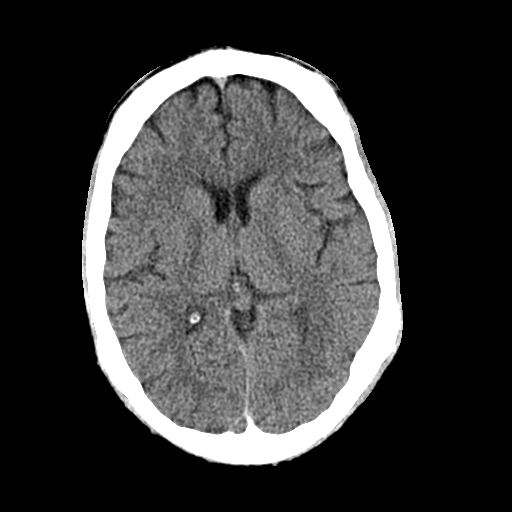
[im 17/32  brain]
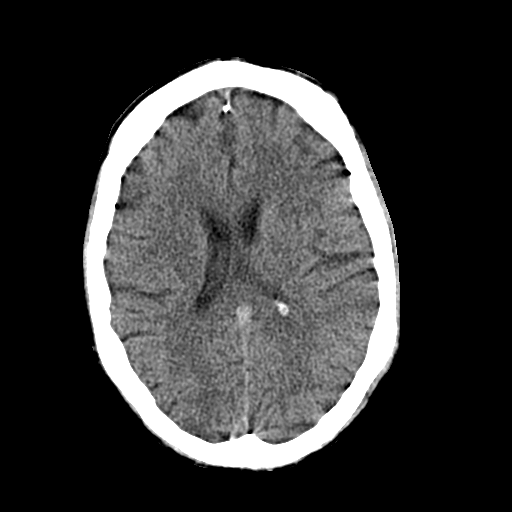
[im 17/32  bone]
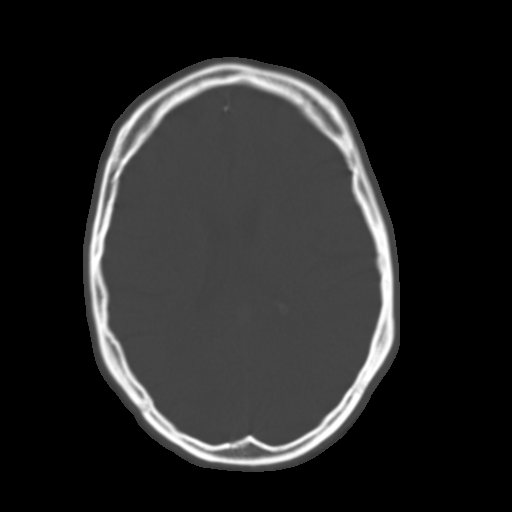
[im 19/32  brain]
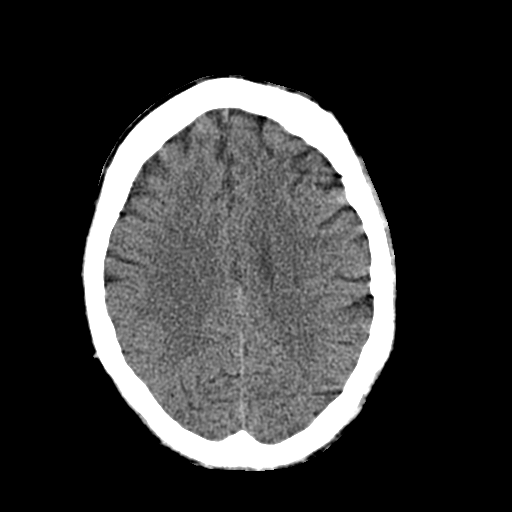
[im 21/32  brain]
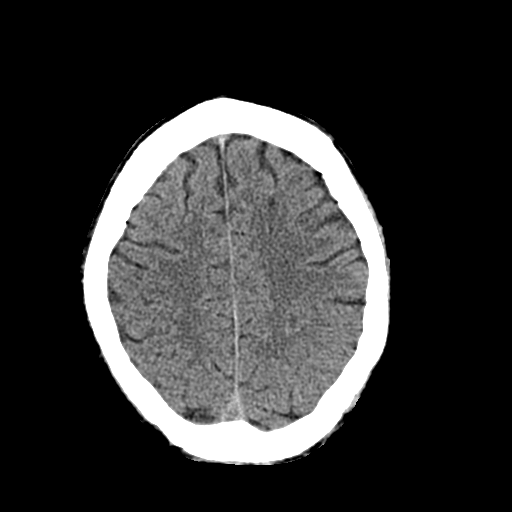
[im 23/32  brain]
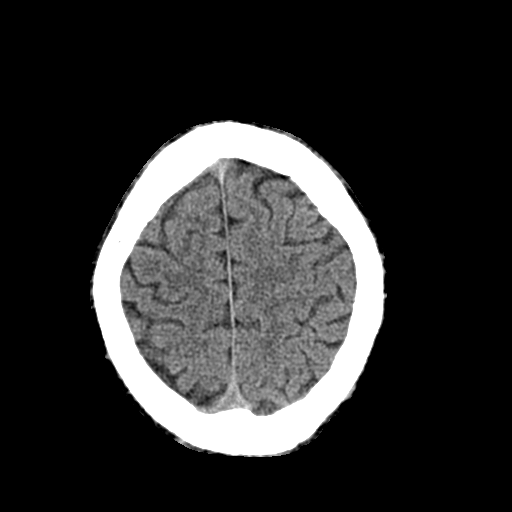
[im 24/32  brain]
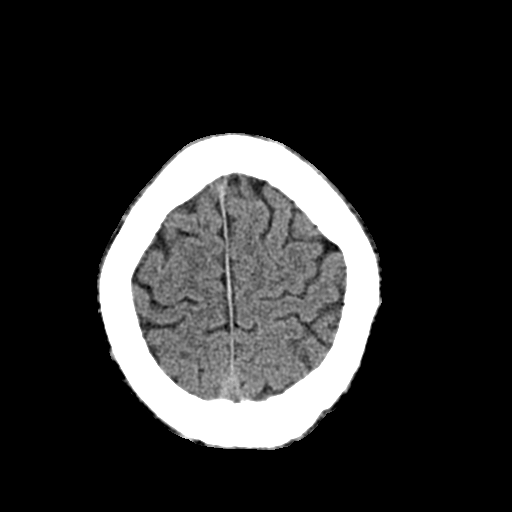
[im 24/32  bone]
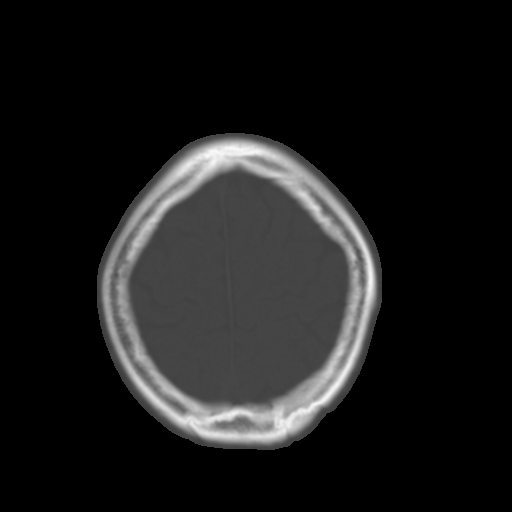
[im 26/32  brain]
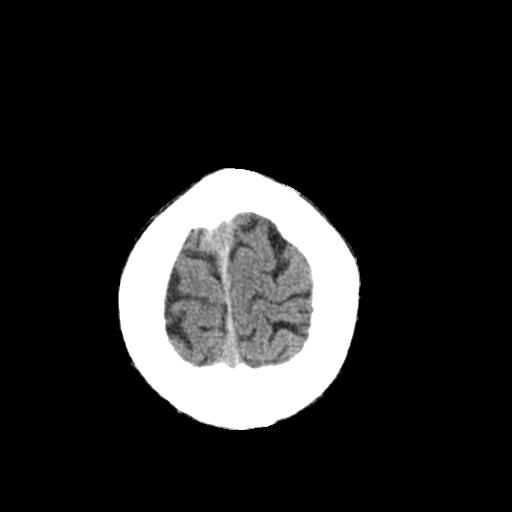
[im 28/32  brain]
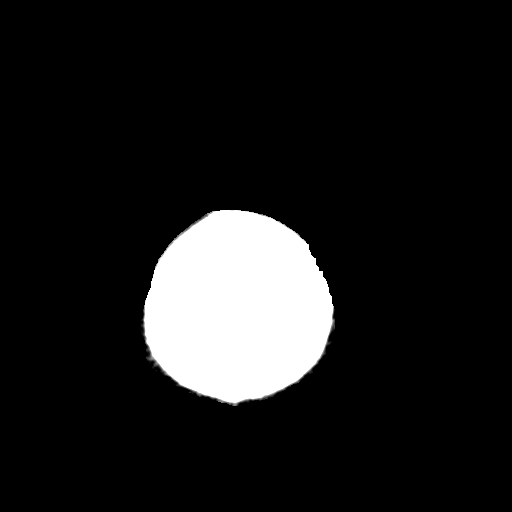
[im 30/32  brain]
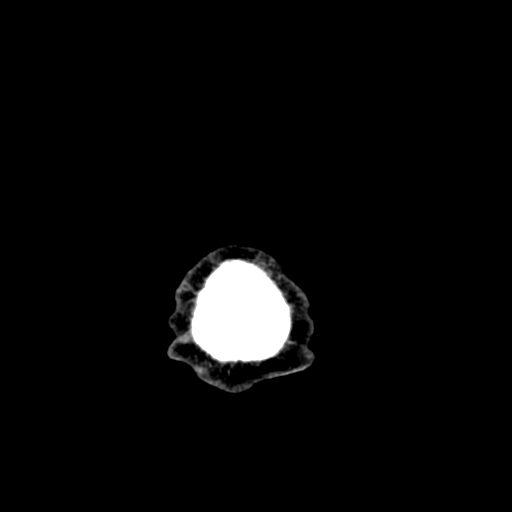

[16 of 30 positions shown; findings below may reference images not displayed]

FINDINGS: No acute intracranial hemorrhage. No focal mass lesion. No CT
evidence of acute infarction. No midline shift or mass effect. No
hydrocephalus. Basilar cisterns are patent. Paranasal sinuses and
mastoid air cells are clear.
IMPRESSION: History no acute

## 2017-02-09 IMAGING — CR DG CHEST 1V PORT
1 series · 1 of 1 positions shown · non-contrast
Comparison: 11/19/2014

CLINICAL DATA: Shortness of breath.  PICC in place.

EXAM:
PORTABLE CHEST - 1 VIEW

[AP]
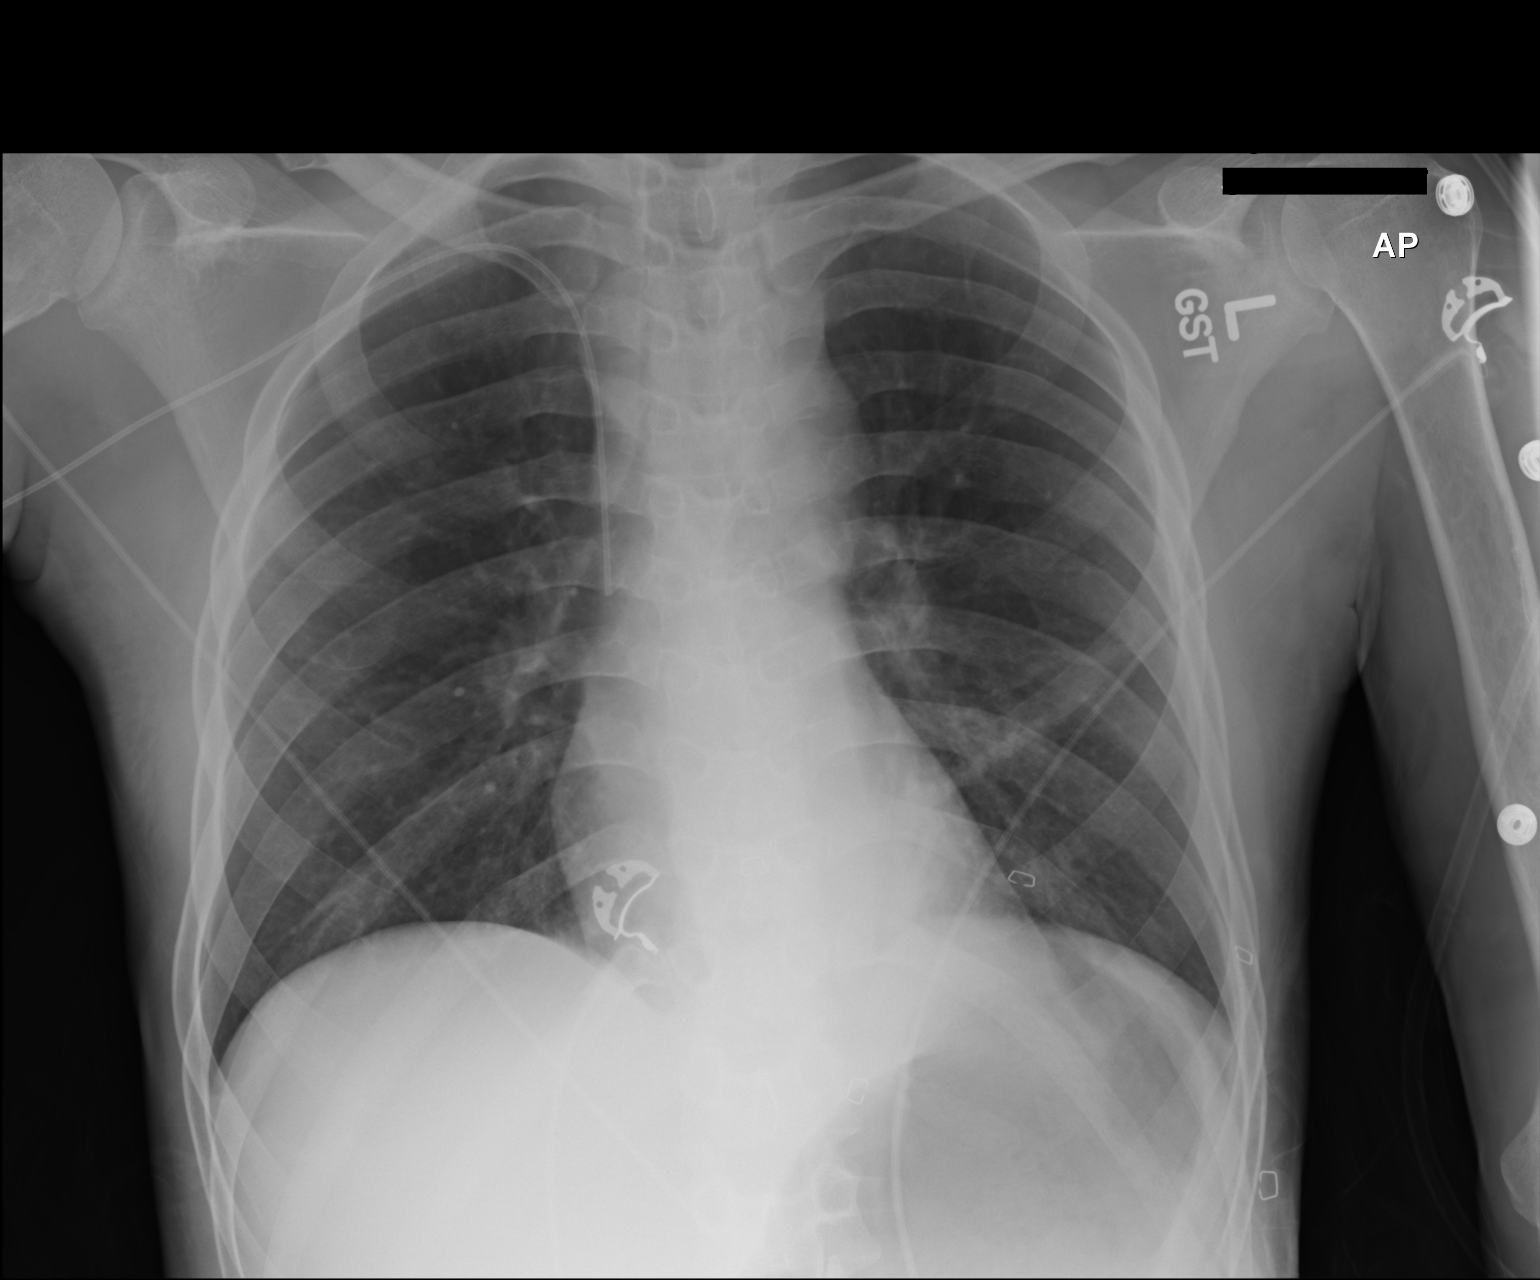

[1 of 1 positions shown; findings below may reference images not displayed]

FINDINGS: Tip of the right upper extremity PICC in the mid-distal SVC.
Cardiomediastinal contours are normal. Decreased left basilar
airspace disease with minimal residual linear opacities. The lungs
are otherwise clear. There is no pleural effusion or pneumothorax.
Unchanged appearance of the right distal clavicle with probable
posttraumatic deformity.
IMPRESSION: 1. Tip of the right upper extremity PICC in the mid distal SVC.
2. Improved left basilar aeration with minimal residual linear
opacity, likely atelectasis.

## 2017-02-17 IMAGING — CR DG CHEST 1V PORT
1 series · 1 of 1 positions shown · non-contrast
Comparison: [DATE] [DATE] seen

CLINICAL DATA: Fevers, weakness

EXAM:
PORTABLE CHEST - 1 VIEW

[AP]
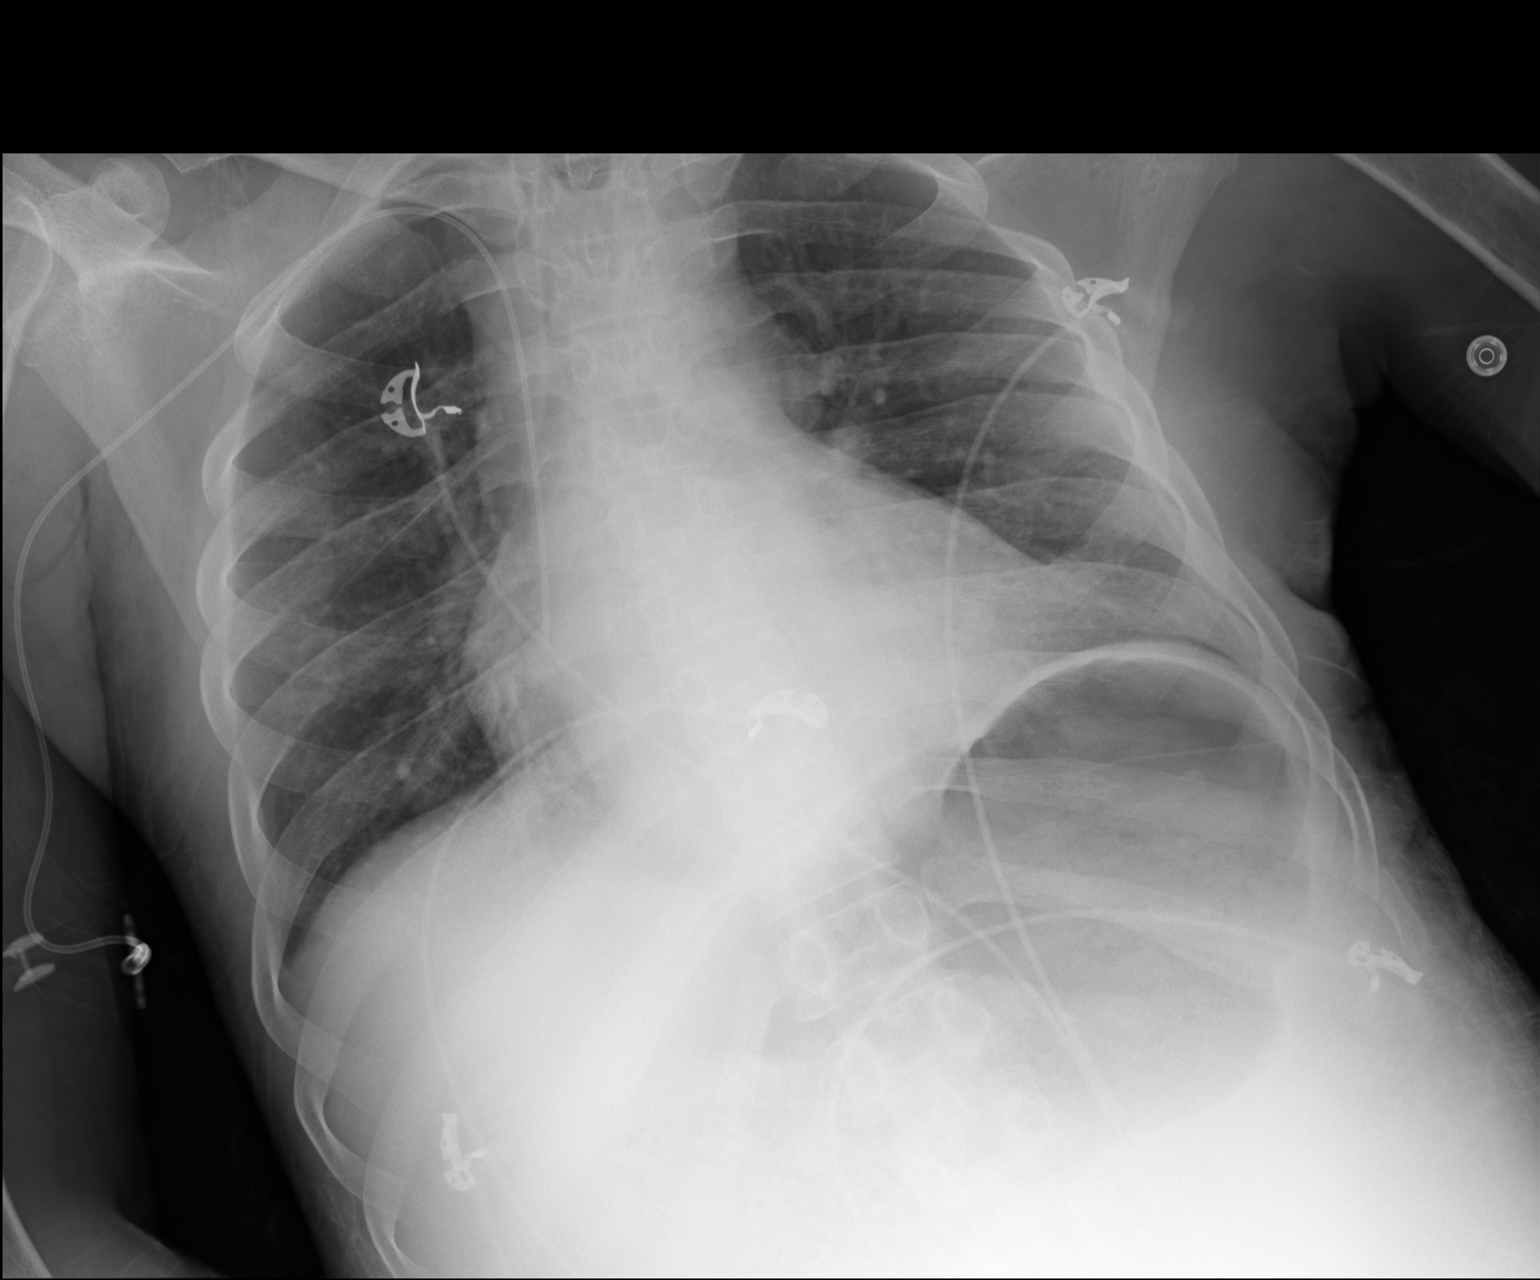

[1 of 1 positions shown; findings below may reference images not displayed]

FINDINGS: Cardiac shadow is prominent but accentuated by the portable
technique. A right-sided PICC line is again seen in the superior
right atrium. The lungs are well aerated bilaterally. Increased
density is noted in the left retrocardiac region likely representing
acute infiltrate.
IMPRESSION: Left retrocardiac infiltrate.

## 2017-02-18 IMAGING — CT CT CHEST W/ CM
2 of 4 series · 12 of 36 positions shown, 15 images · IV contrast (Omni 300)
Comparison: Multiple prior studies including 11/15/2014 CT thorax

CLINICAL DATA: Initial evaluation for fever, patient is paraplegic
due to spinal cord infarct

EXAM:
CT CHEST, ABDOMEN, AND PELVIS WITH CONTRAST
TECHNIQUE: Multidetector CT imaging of the chest, abdomen and pelvis was
performed following the standard protocol during bolus
administration of intravenous contrast.
CONTRAST:  100mL OMNIPAQUE IOHEXOL 300 MG/ML  SOLN

[Series 2: cap 5.0 i31f 1 · axial · 0.65mm/px · z∈[+788,+1348]mm · 9 of 128 slices shown, 12 images]
[im 8/128  mediastinal]
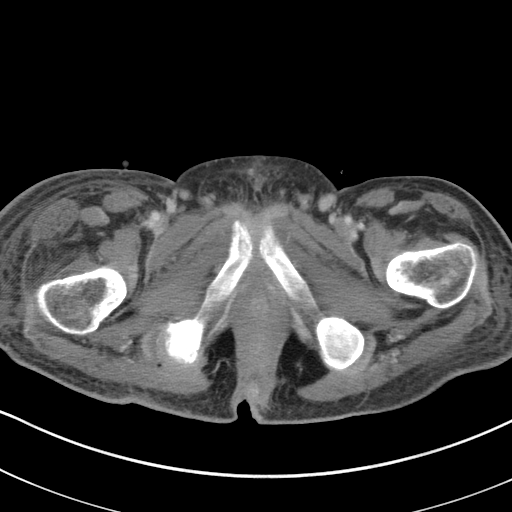
[im 8/128  lung]
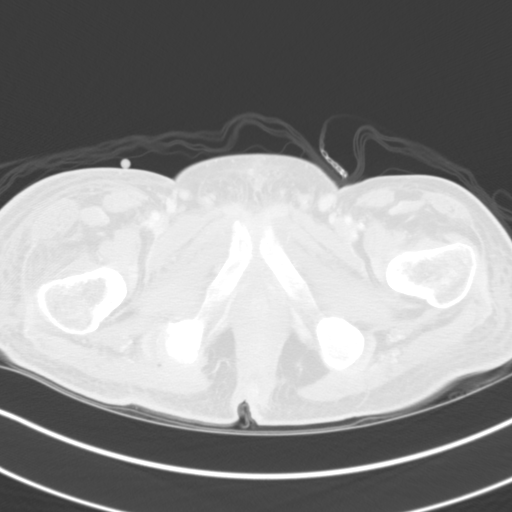
[im 22/128  lung]
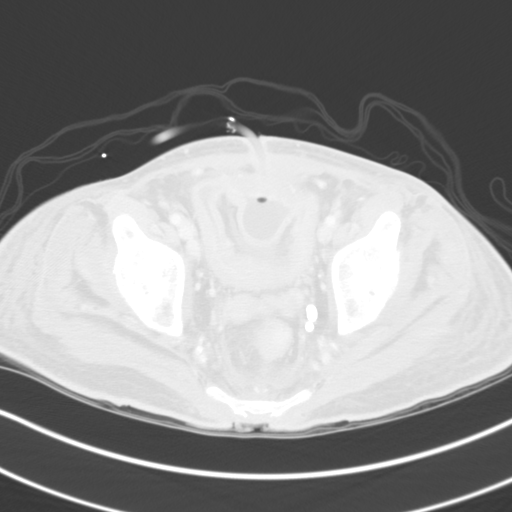
[im 36/128  lung]
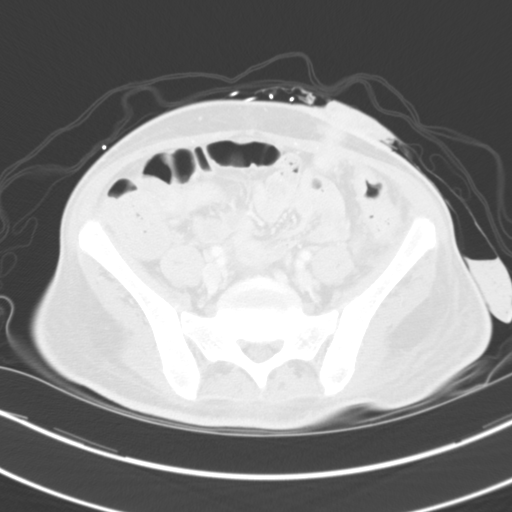
[im 50/128  lung]
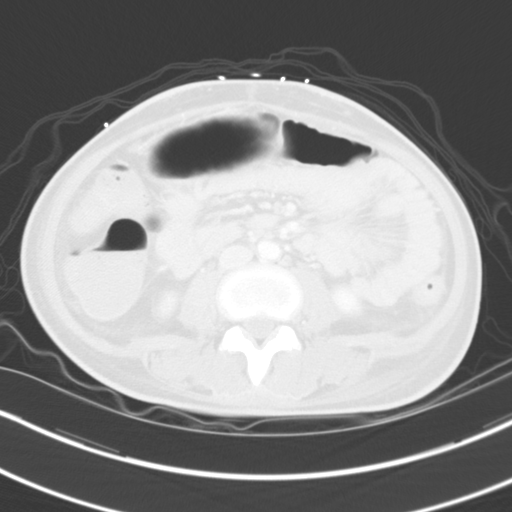
[im 64/128  mediastinal]
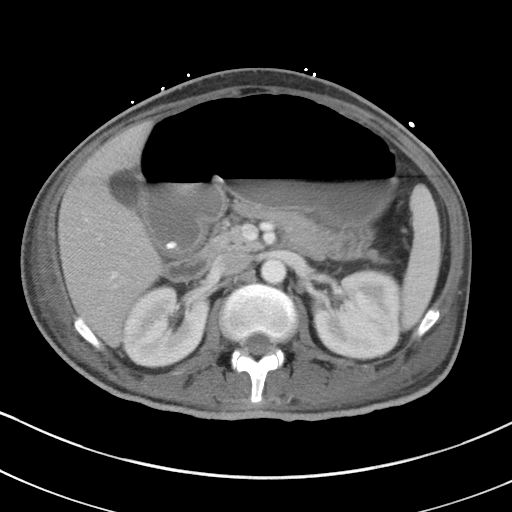
[im 64/128  lung]
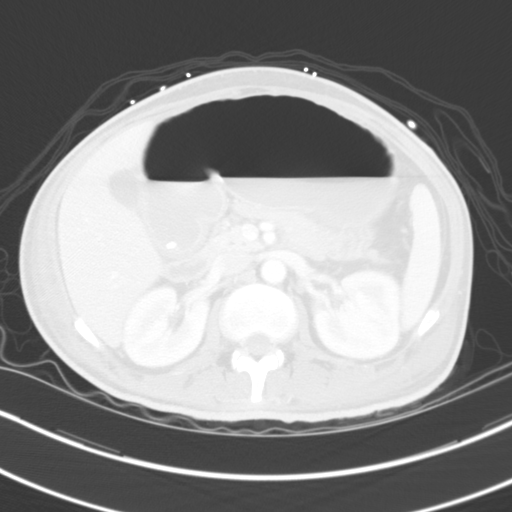
[im 78/128  lung]
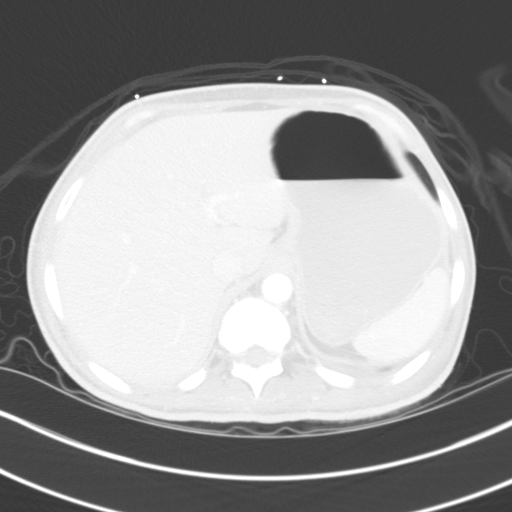
[im 92/128  lung]
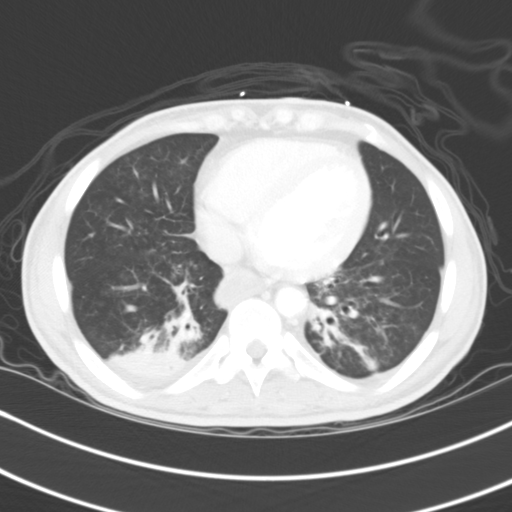
[im 106/128  lung]
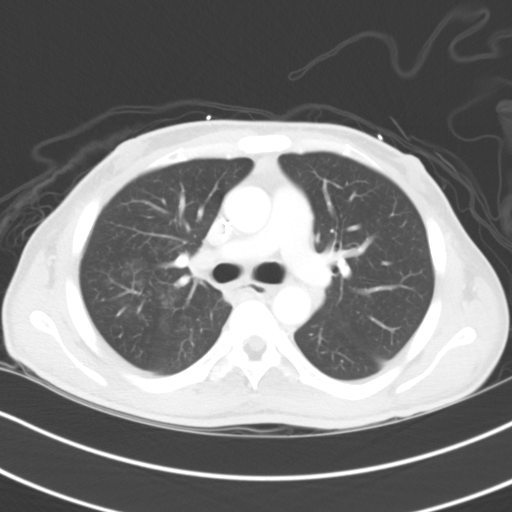
[im 120/128  mediastinal]
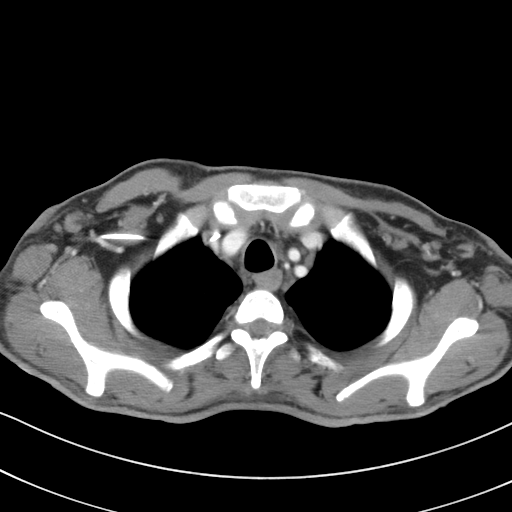
[im 120/128  lung]
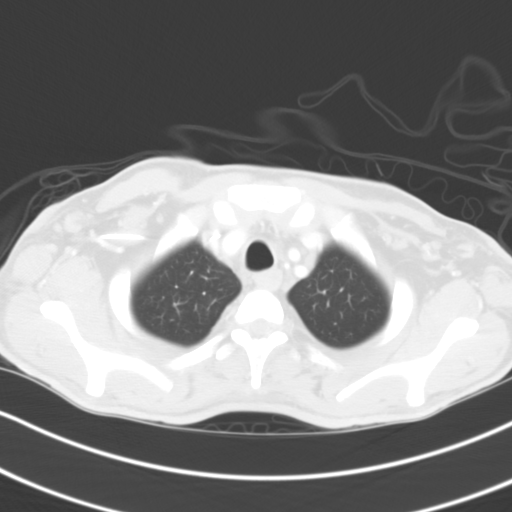

[Series 5: coronal · coronal · 0.65mm/px · 3 of 83 slices shown]
[im 17/83  lung]
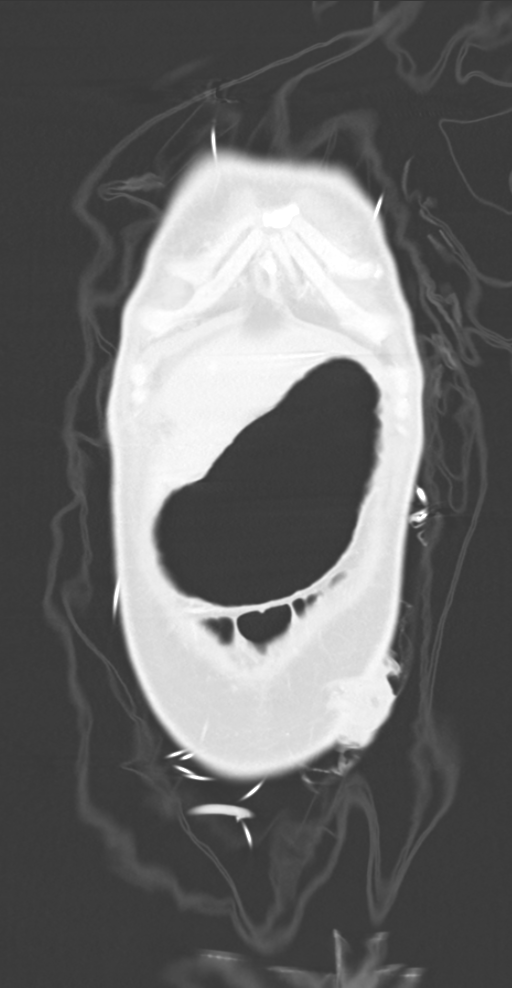
[im 33/83  lung]
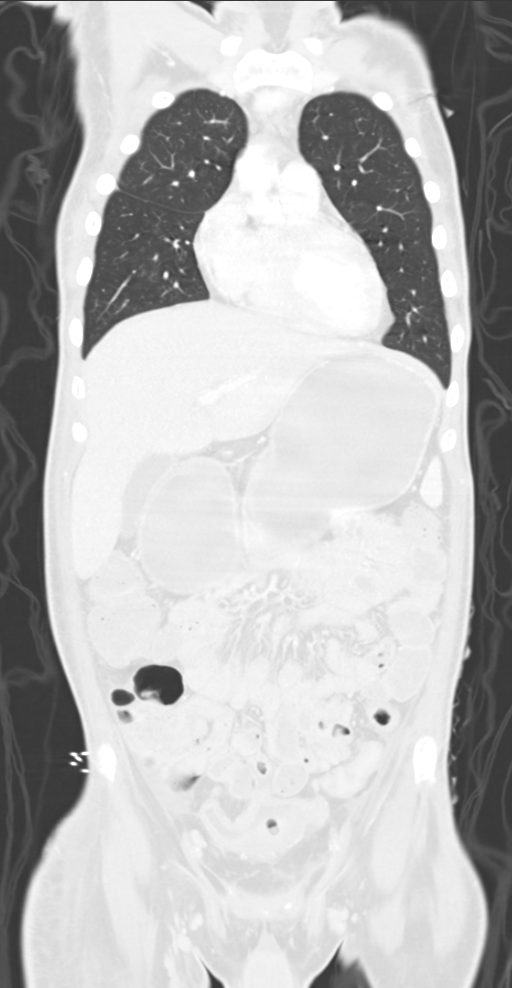
[im 50/83  lung]
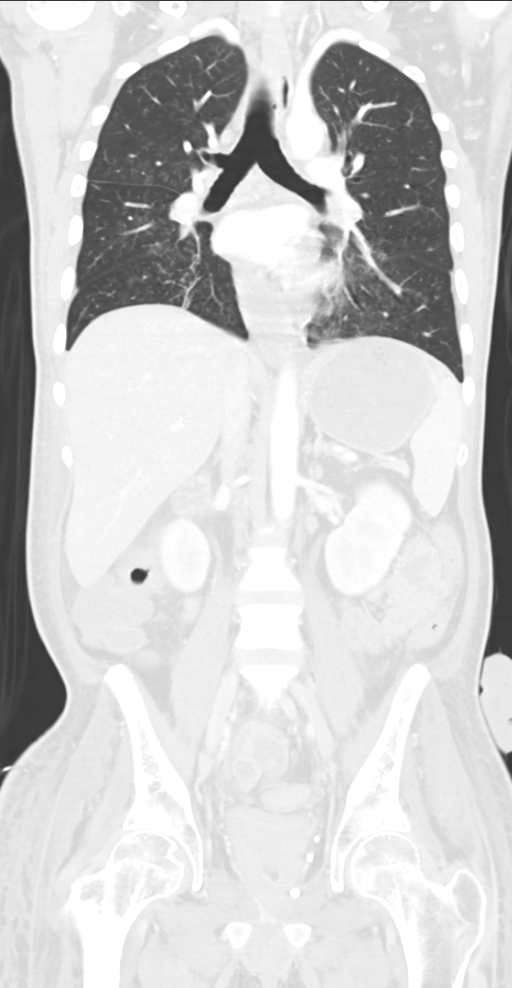

[12 of 36 positions shown; findings below may reference images not displayed]

FINDINGS: CT CHEST FINDINGS

Right lower lobe fairly extensive consolidation with air
bronchograms. In the left lower lobe, consolidation is again
identified, showing moderate improvement when compared to the prior
study. 11 mm triangular right upper lobe opacity series 3, image
number 11 and 12, stable from 11/15/2014. Faint patchy right upper
lobe infiltrate new from prior study.

Residual thymic tissue anterior mediastinum. Numerous likely
reactive mediastinal lymph nodes, similar to prior study. Patulous
fluid-filled esophagus noted.

Small right pleural effusion. Tiny left pleural effusions similar to
prior study.

CT ABDOMEN AND PELVIS FINDINGS

Significant gastric distention with air and fluid. Liver and
gallbladder are normal. Spleen is normal. Pancreas is normal.

Adrenal glands are normal. No evidence of hydronephrosis on the left
side, although the urothelium of the renal pelvis and ureter
enhances mildly. Mild perinephric inflammatory change on the left.
On the right, there is evidence of medial lower pole scarring. Mild
urothelial enhancement of the renal pelvis and ureter identified on
the right as well.

Numerous phleboliths along the course of the distal ureters. There
is a suprapubic catheter in the bladder. There is bladder wall
thickening.

Left lower quadrant ostomy noted. Nonobstructive bowel gas pattern.
Distal colon decompressed.

Decubitus ulcer identified over the inferior sacrum and coccyx. Also
evidence of decubitus ulcer with gas within it over the right
posterior ischium with evidence of osteomyelitis involving the
ischium, consisting of cortical destruction periosteal reaction. No
definite osteomyelitis involving the inferior sacrum and coccyx
although ulcer extends deep down nearly to the level of bone.
IMPRESSION: 1. Improving left lower lobe pneumonia
2. Pneumonia right lower lobe worse. New faint right upper lobe
infiltrate consistent with pneumonia as well.
3. 11 mm nodular opacity right upper lobe. Follow-up chest CT in 3-6
months recommended. This recommendation follows the consensus
statement: Guidelines for Management of Small Pulmonary Nodules
Detected on CT Scans: A Statement from the [HOSPITAL] as
published in Radiology 8337; [DATE].
4. Distended stomach similar to prior studies likely due to
gastroparesis.
5. Suprapubic catheter with bladder wall thickening. Enhancement of
bilateral urothelium. Cannot exclude possibility of ascending
infection causing pyelonephritis.
6. Decubitus ulcers as described above. Appearance consistent with
osteomyelitis right ischium.

## 2017-02-21 IMAGING — CR DG CHEST 1V PORT
1 series · 1 of 1 positions shown · non-contrast
Comparison: 12/14/2014 and earlier.

CLINICAL DATA: 28-year-old male intubated. Respiratory distress.
Initial encounter.

EXAM:
PORTABLE CHEST - 1 VIEW

[AP]
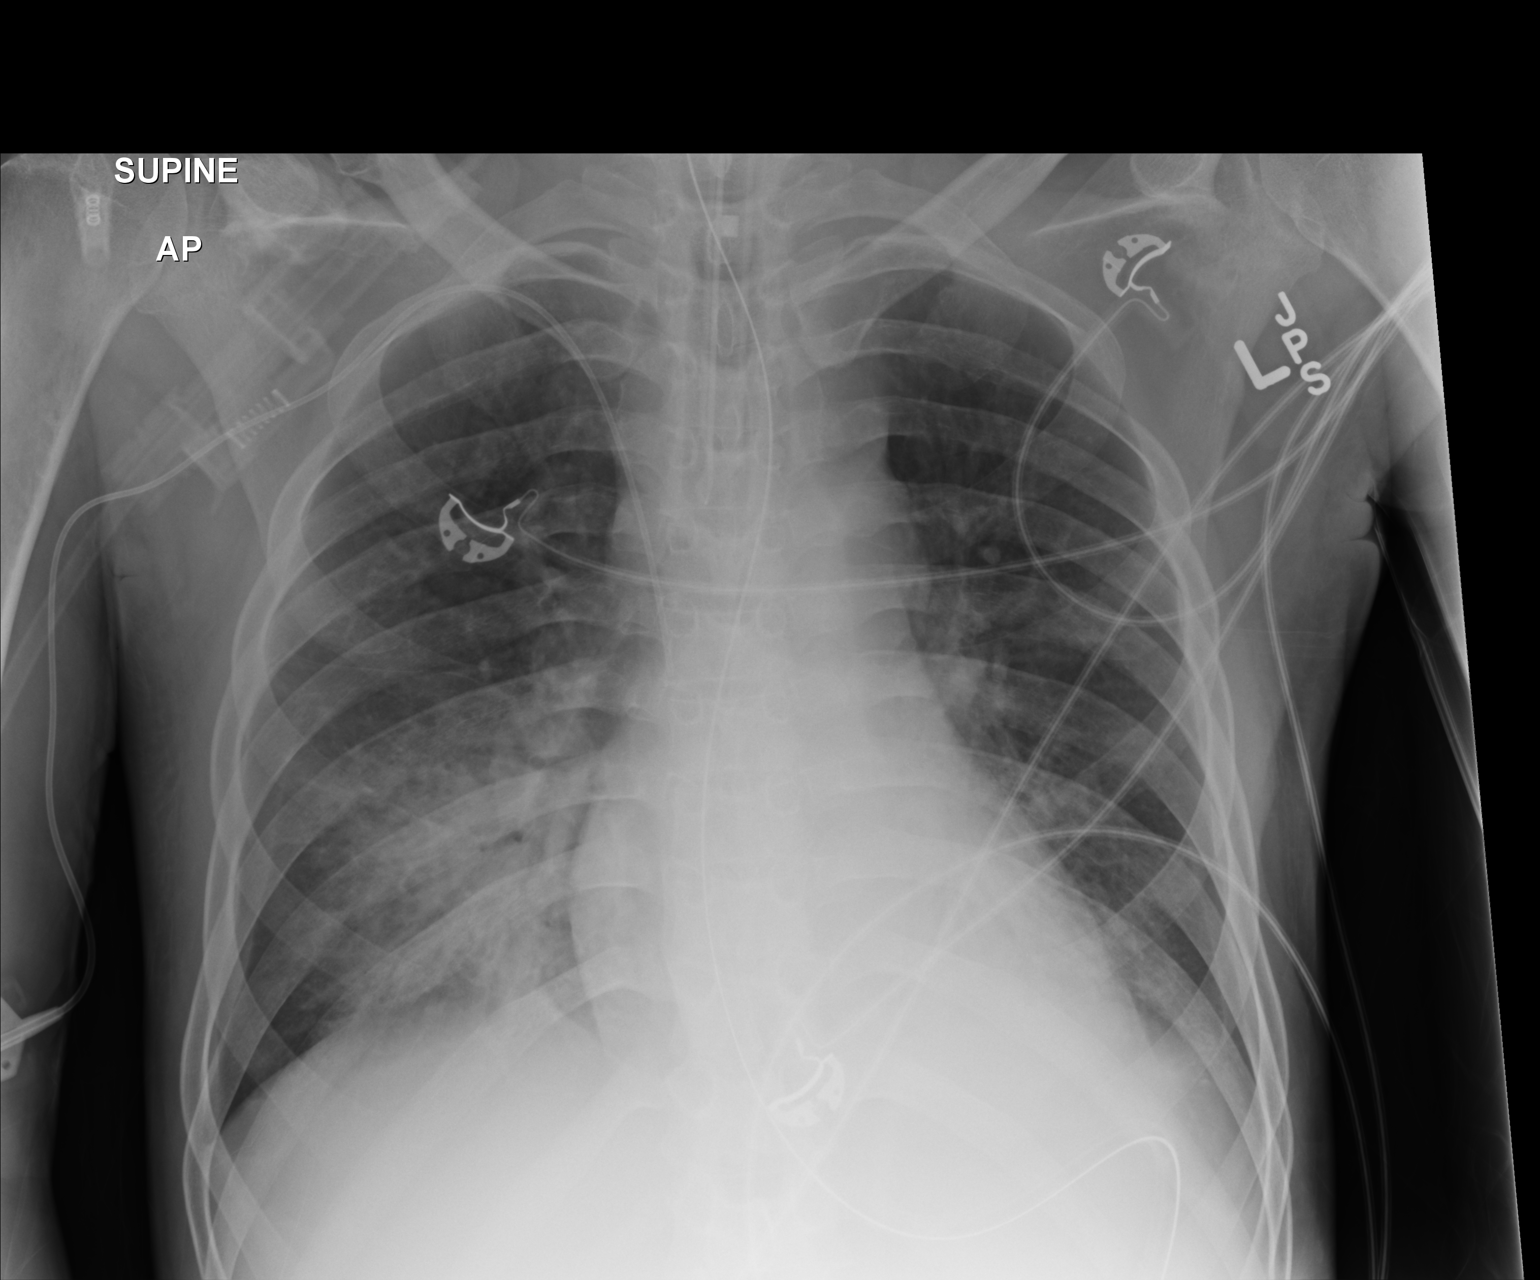

[1 of 1 positions shown; findings below may reference images not displayed]

FINDINGS: Portable AP supine view at 1312 hrs. Endotracheal tube tip in good
position between the level the clavicles and carina. Enteric tube
placed in courses to the left upper quadrant, tip not included.
Stable right PICC line.

Stable lung volumes. Bilateral lower lobe airspace disease appears
not significantly changed. No pneumothorax, large effusion or edema.
Stable cardiac size and mediastinal contours.
IMPRESSION: 1. Endotracheal tube and enteric tube appear probe early place.
2. Bilateral lower lobe pneumonia not significantly changed.

## 2017-02-22 IMAGING — CR DG CHEST 1V PORT
1 series · 1 of 1 positions shown · non-contrast
Comparison: 12/14/2014

CLINICAL DATA: Respiratory failure

EXAM:
PORTABLE CHEST - 1 VIEW

[AP]
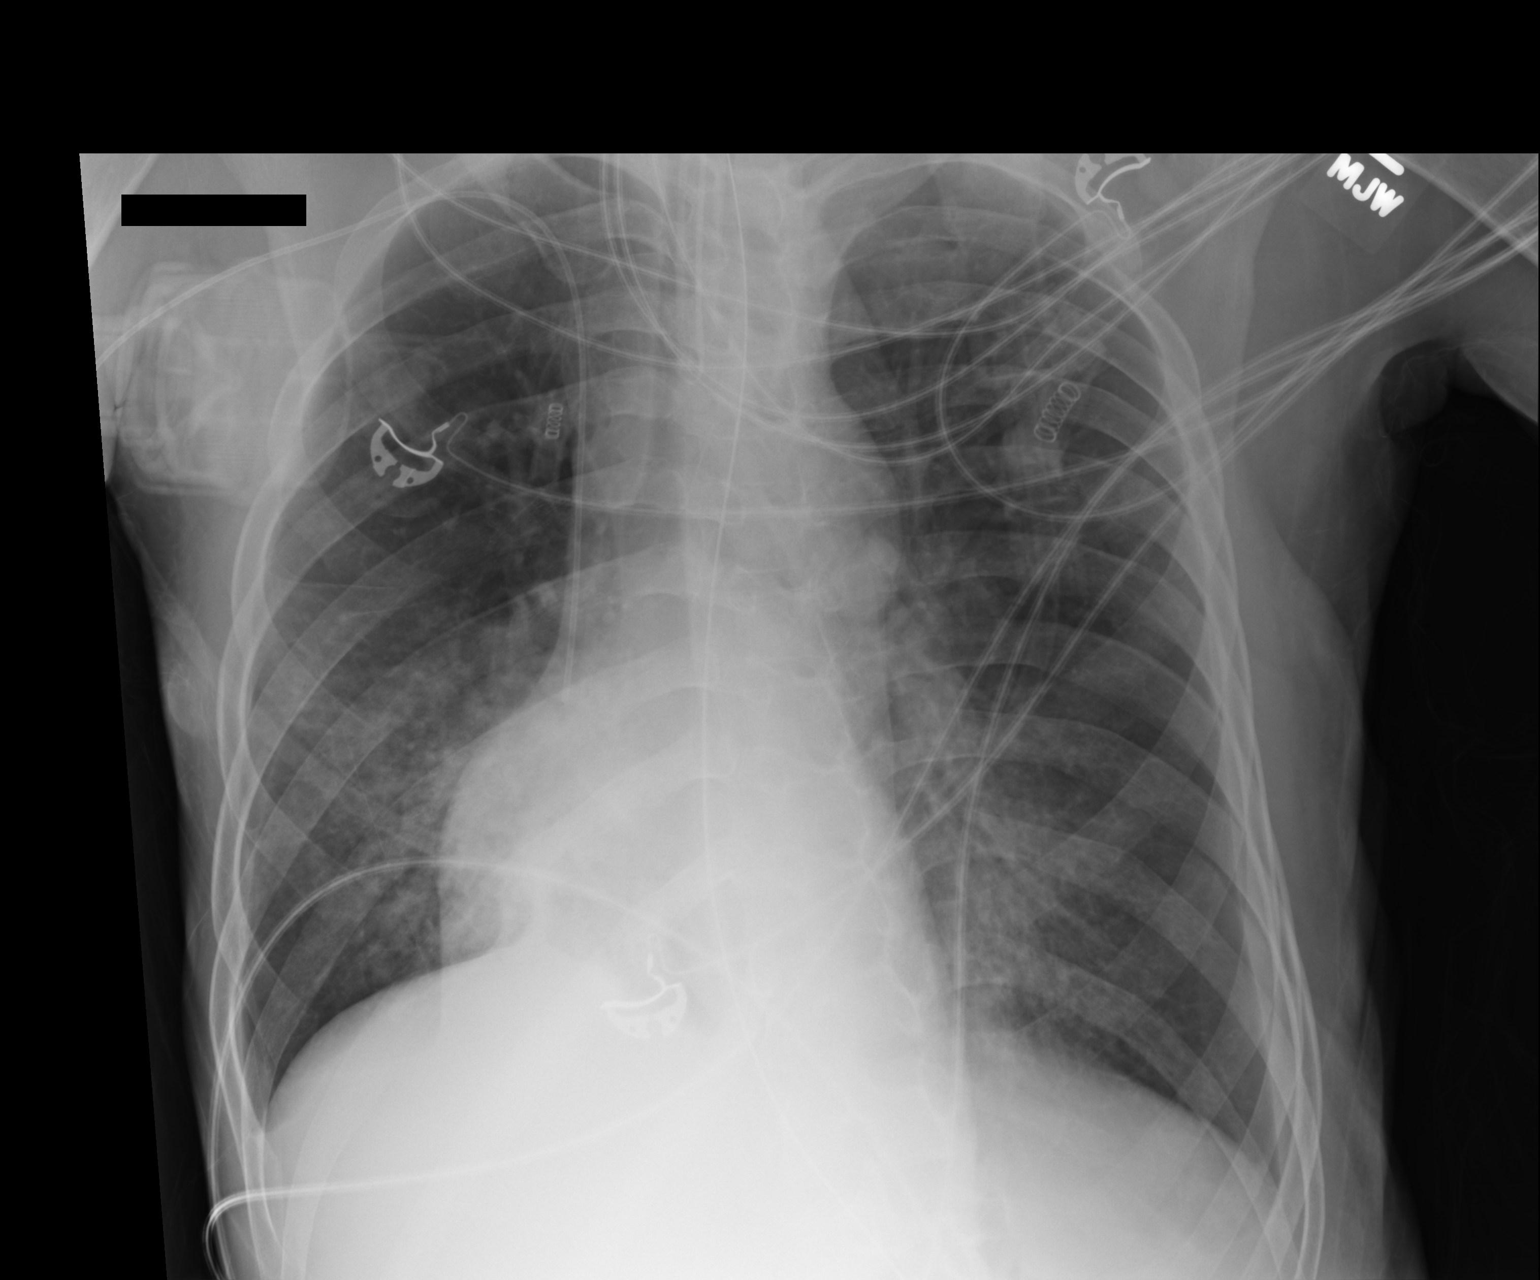

[1 of 1 positions shown; findings below may reference images not displayed]

FINDINGS: The cardiac shadow is stable. Bibasilar infiltrates are again
identified and stable. A right-sided PICC line, endotracheal tube
and nasogastric catheter are all again seen and stable. No acute
abnormality is noted.
IMPRESSION: Stable bilateral lower lobe infiltrates.

## 2017-02-23 IMAGING — CR DG CHEST 1V PORT
1 series · 1 of 1 positions shown · non-contrast
Comparison: Portable chest x-ray of 12/15/2014

CLINICAL DATA: Ventilator dependent respiratory failure

EXAM:
PORTABLE CHEST - 1 VIEW

[AP]
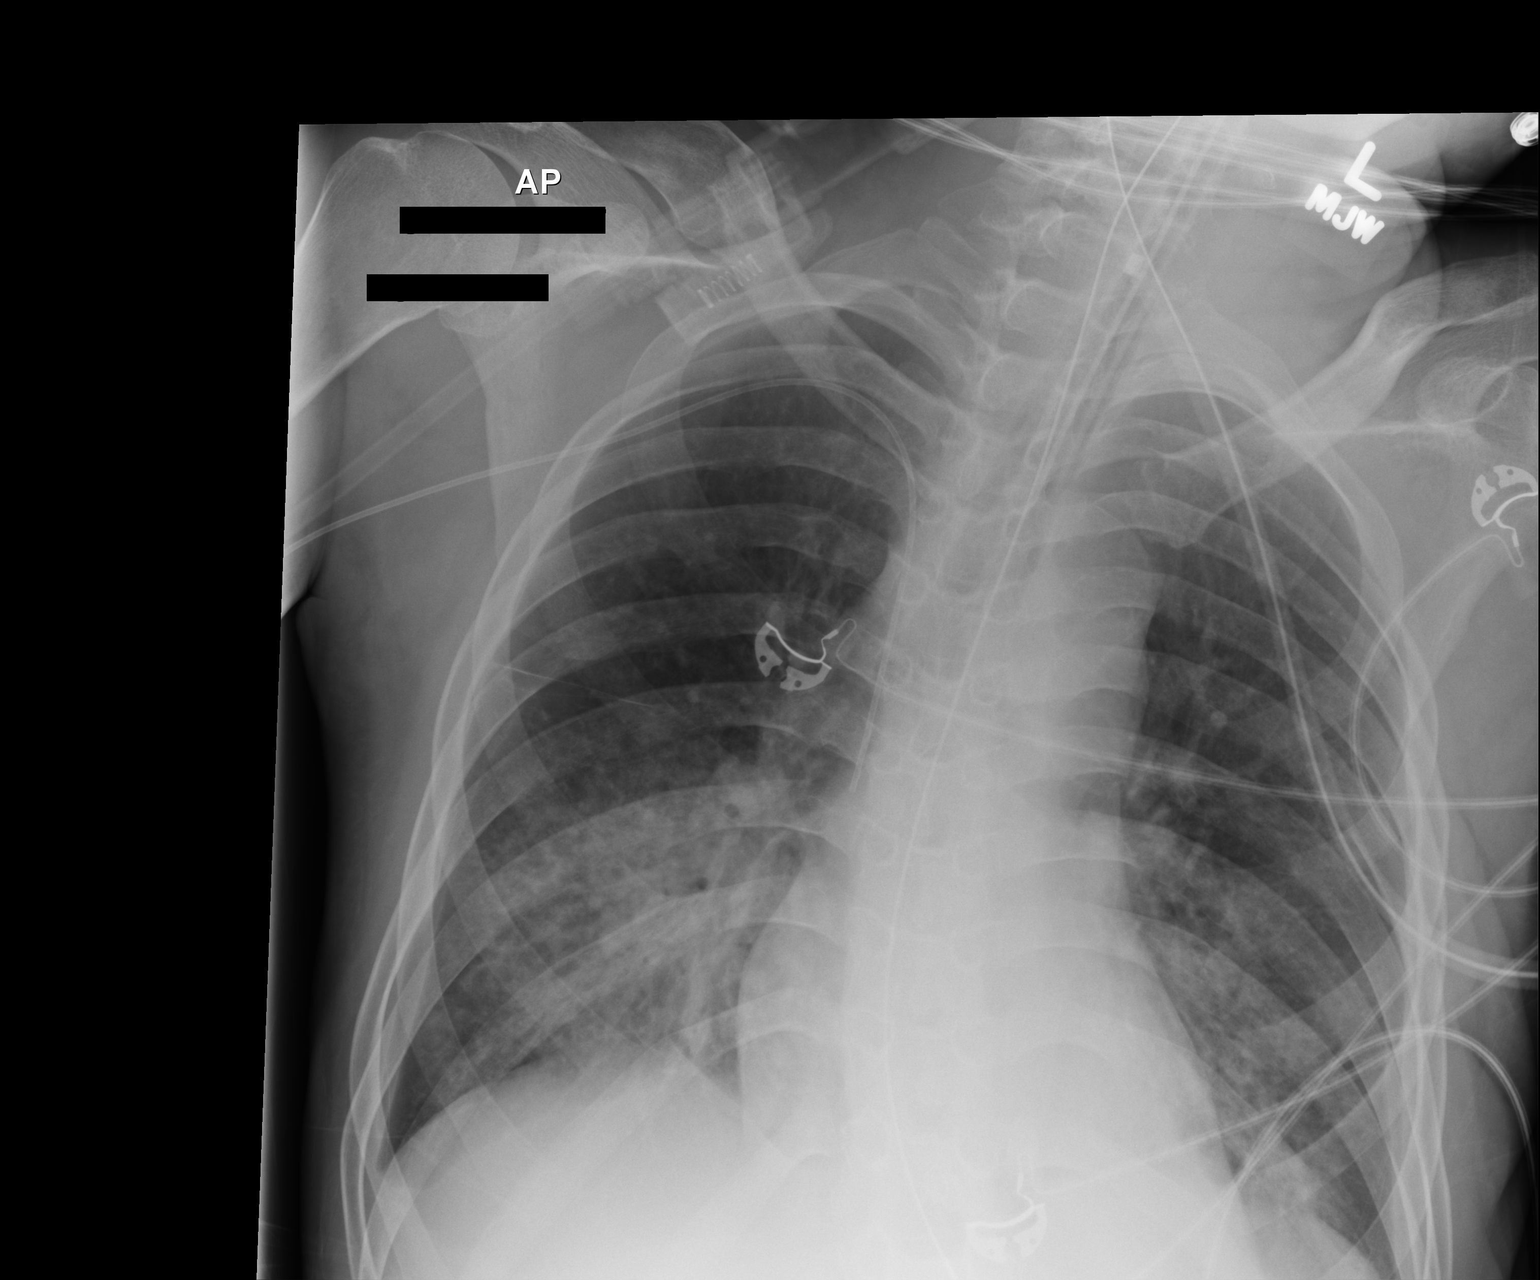

[1 of 1 positions shown; findings below may reference images not displayed]

FINDINGS: There is slight worsening of opacity at the right lung base with
little change in the opacity medially at the left lung base most
consistent with pneumonia. No definite effusion is seen. The tip of
the endotracheal tube is approximately 5.5 cm above the carina.
Right central venous line tip overlies the mid lower SVC and no
pneumothorax is seen. An NG tube is present.
IMPRESSION: Slight worsening of lower lobe infiltrates most consistent with
pneumonia.

## 2017-02-24 IMAGING — CR DG CHEST 1V PORT
1 series · 1 of 1 positions shown · non-contrast
Comparison: 12/16/2014.

CLINICAL DATA: Respiratory failure.

EXAM:
PORTABLE CHEST - 1 VIEW

[portable]
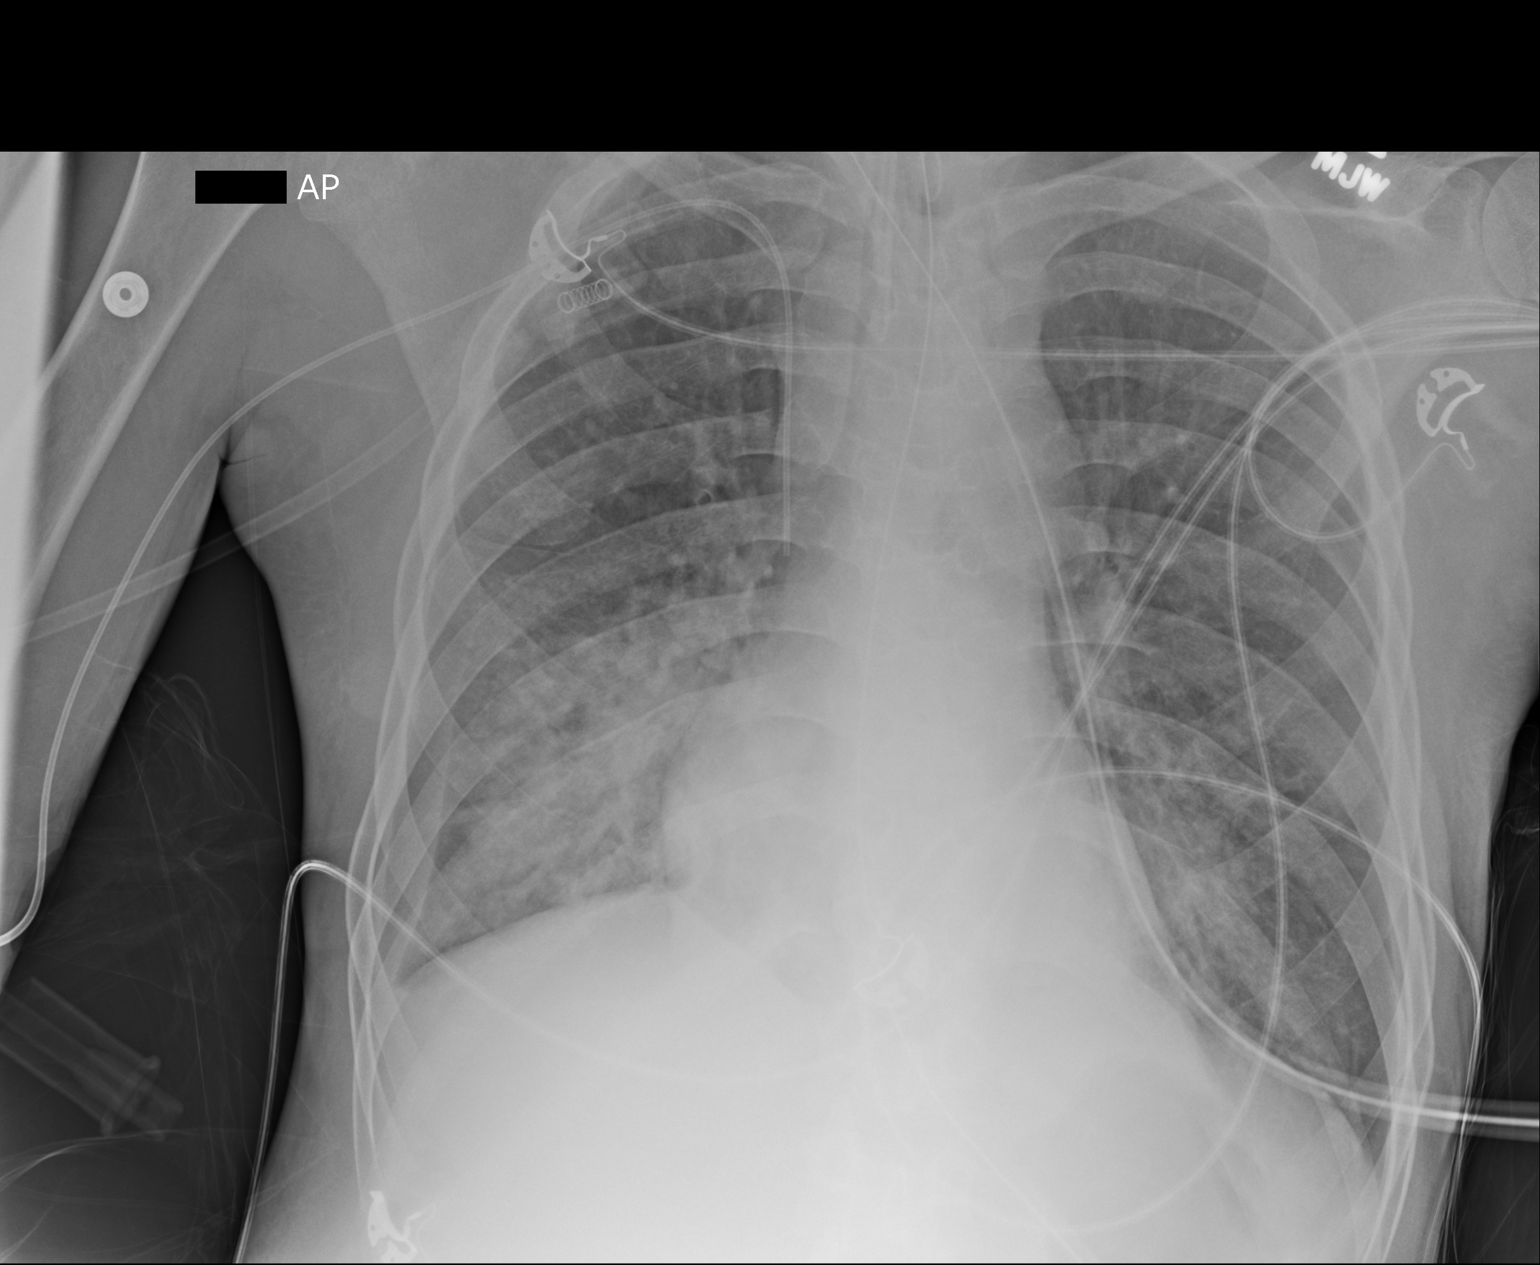

[1 of 1 positions shown; findings below may reference images not displayed]

FINDINGS: Tracheostomy tube, NG tube, right PICC line in stable position.
Persistent prominent bibasilar pulmonary infiltrates. Heart size
stable. No pleural effusion or pneumothorax. No acute bony
abnormality .
IMPRESSION: 1. Lines and tubes in stable position.
2. Persistent prominent bibasilar pulmonary infiltrates most
consistent with pneumonia. No significant interval clearing.

## 2017-02-25 IMAGING — CR DG CHEST 1V PORT
1 series · 1 of 1 positions shown · non-contrast
Comparison: Multiple prior chest x-ray, most recent 12/17/2014,
12/16/2014

CLINICAL DATA: 28-year-old male with a history of shortness of
breath.

EXAM:
PORTABLE CHEST - 1 VIEW

[AP]
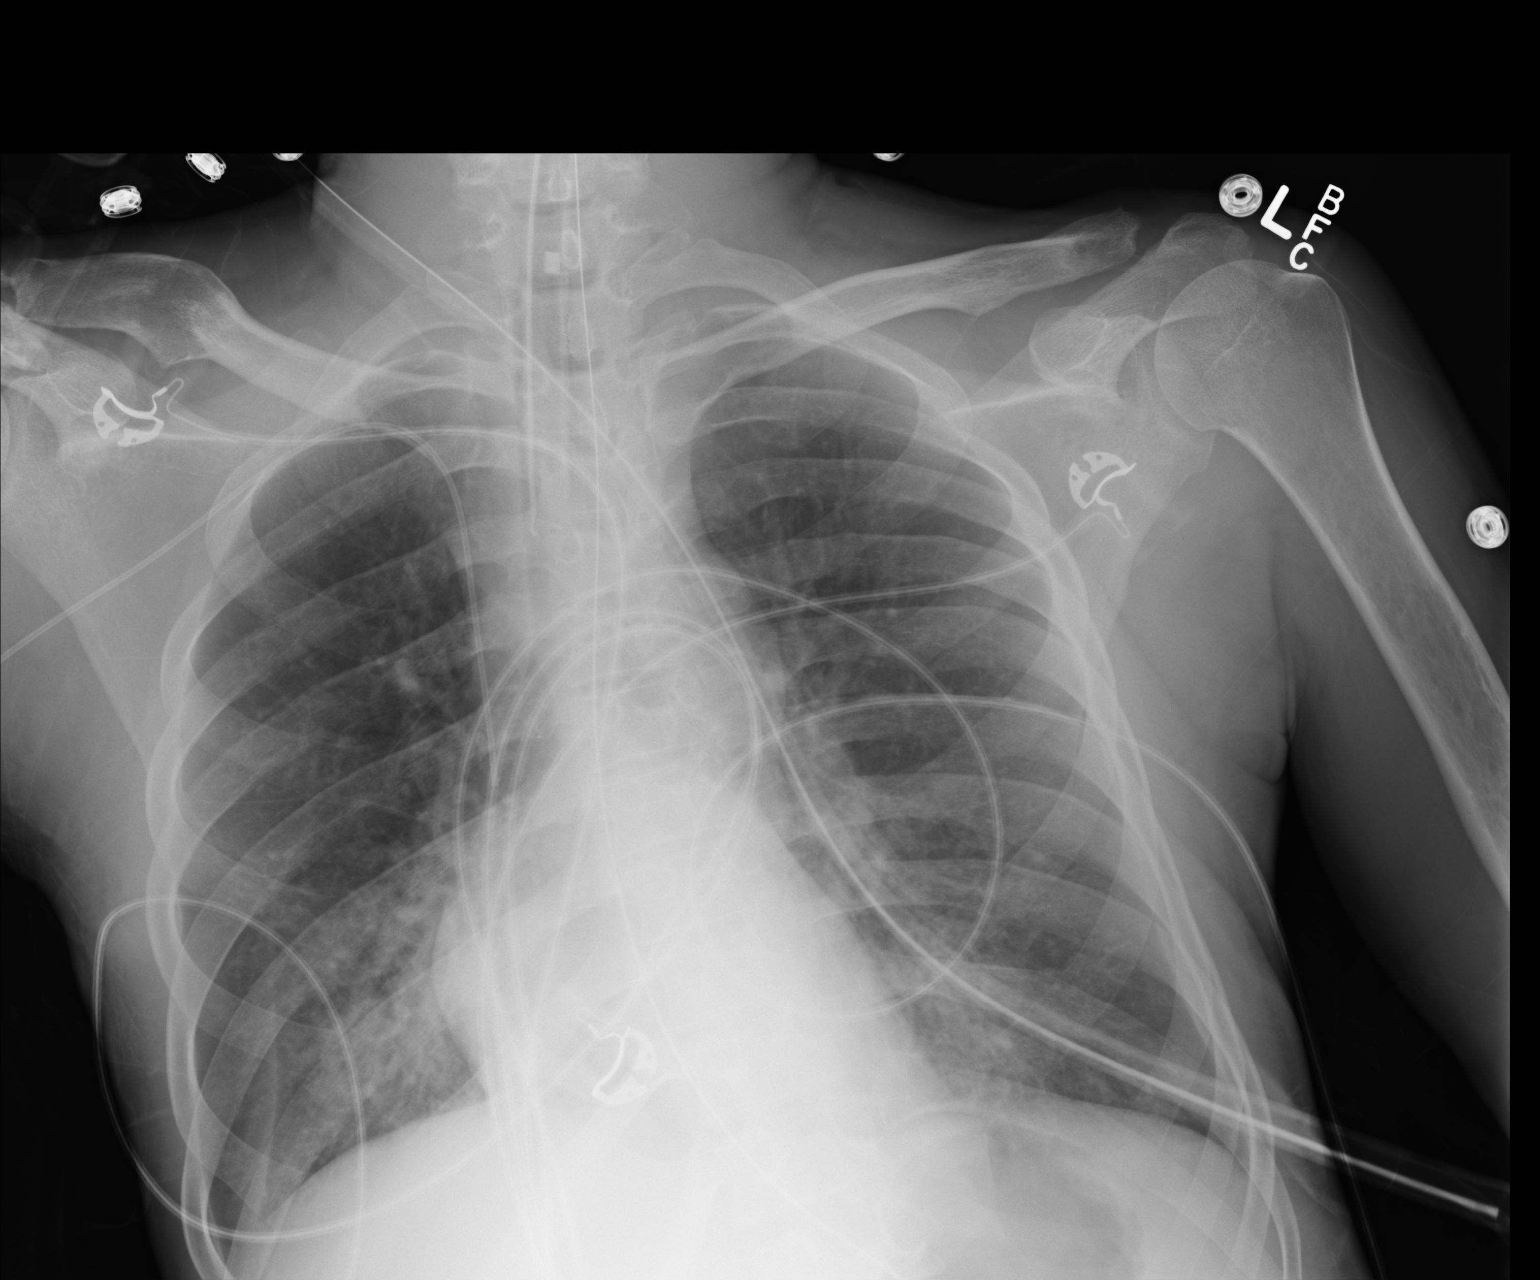

[1 of 1 positions shown; findings below may reference images not displayed]

FINDINGS: Cardiomediastinal silhouette unchanged in size and contour.

Airspace disease persists in the bilateral lower lungs, though
overall improved from the comparison plain film.

No large pleural effusion.  No pneumothorax.

Unchanged position of the endotracheal tube, which appears to
terminate approximately 4.9 cm above the carina.

Enteric tube projects over the mediastinum.

Unchanged position of right approach PICC.
IMPRESSION: Improving bibasilar airspace disease.

Unchanged position of endotracheal tube enteric tube and right upper
extremity PICC.

## 2017-03-16 IMAGING — CR DG CHEST 1V PORT
1 series · 1 of 1 positions shown · non-contrast
Comparison: 12/18/2014 and earlier.

CLINICAL DATA: 28-year-old male with altered mental status. PICC
line position evaluation. Initial encounter.

EXAM:
PORTABLE CHEST - 1 VIEW

[AP]
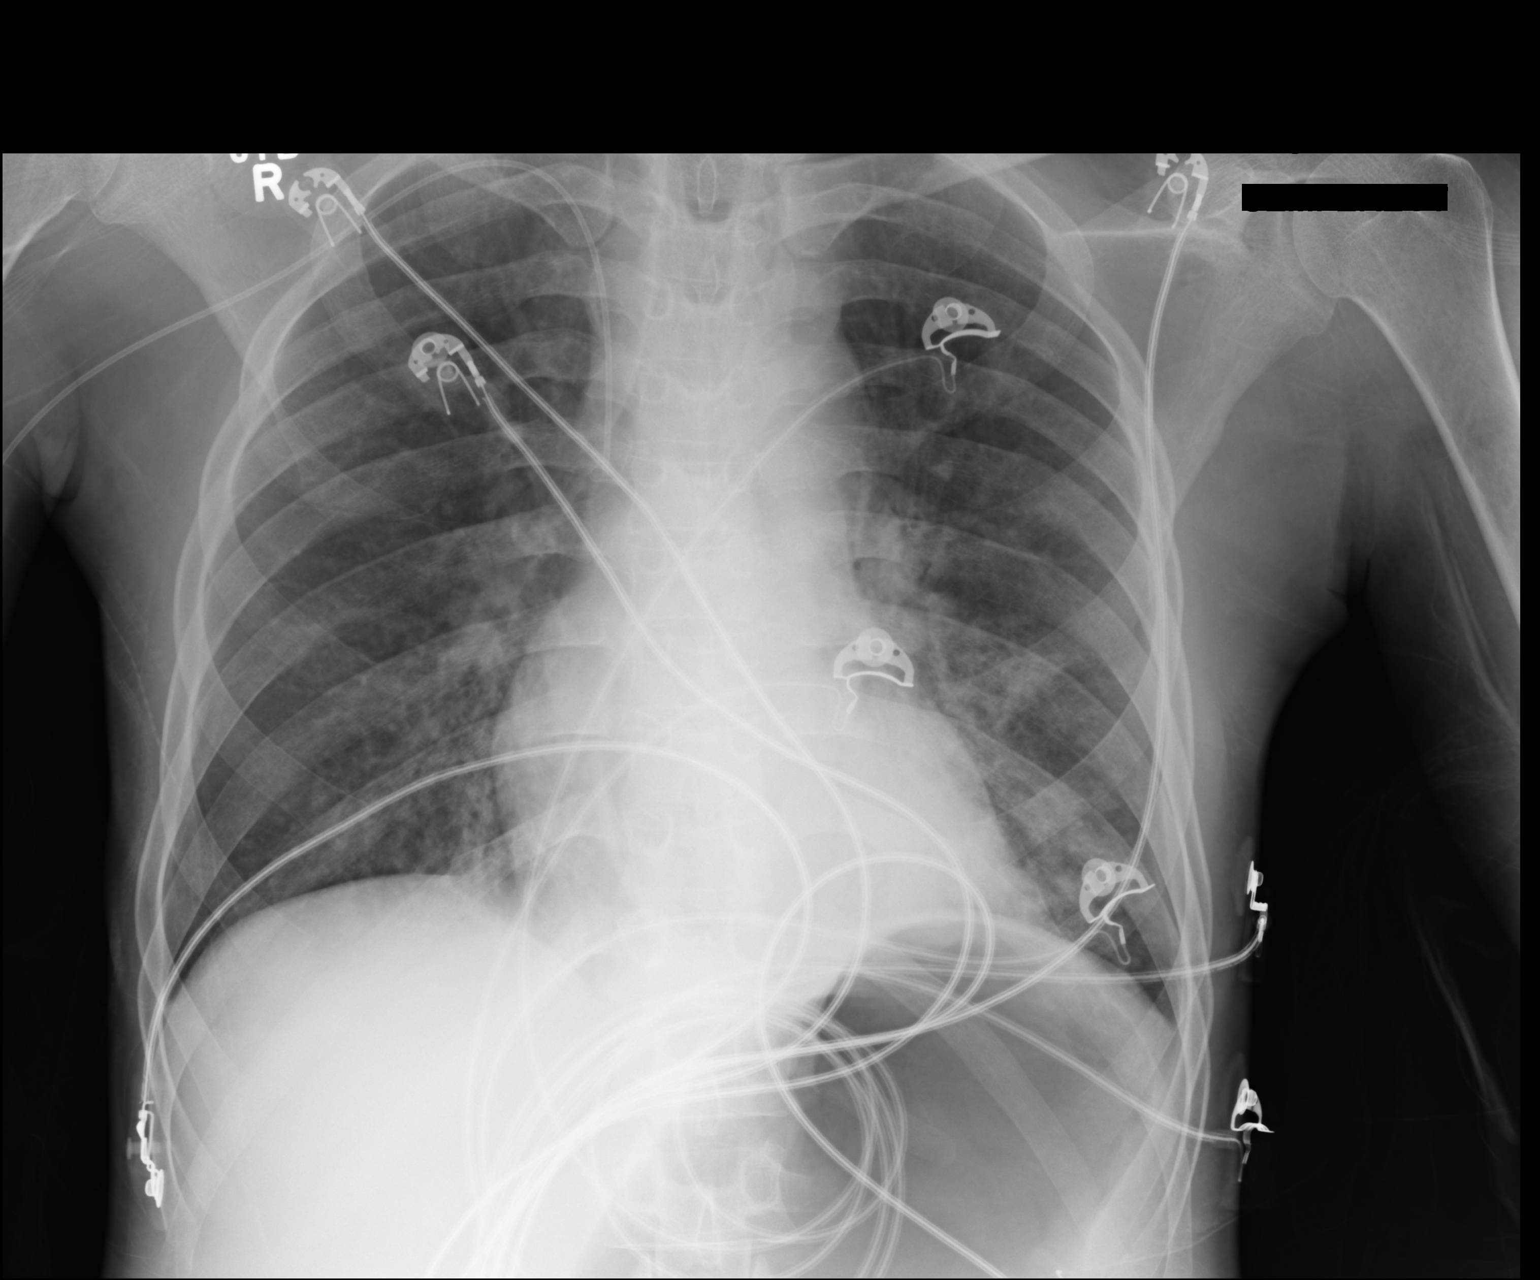

[1 of 1 positions shown; findings below may reference images not displayed]

FINDINGS: Portable AP semi upright view at 2698 hours. Right upper extremity
PICC line re- identified. Tip is at the level of the carina.

Extubated. Enteric tube removed. Stable to mildly improved lung
volumes. Decreased but not resolved bilateral patchy infrahilar
opacity. No pneumothorax or pulmonary edema. No pleural effusion.
Increased gaseous distension of the stomach.
IMPRESSION: 1. Right PICC line tip at the SVC level.
2. Extubated and enteric tube removed.
3. Regressed but not resolved bibasilar pulmonary opacity.

## 2017-03-16 IMAGING — CT CT HEAD W/O CM
2 series · 16 of 30 positions shown, 20 images · non-contrast
Comparison: 12/01/2014

CLINICAL DATA: Lethargy, history of quadriplegia

EXAM:
CT HEAD WITHOUT CONTRAST
TECHNIQUE: Contiguous axial images were obtained from the base of the skull
through the vertex without intravenous contrast.

[Series 201: head w/o, idose (1) · axial · non-contrast · 0.49mm/px · z∈[+66,+191]mm · 13 of 31 slices shown, 17 images]
[im 3/31  brain]
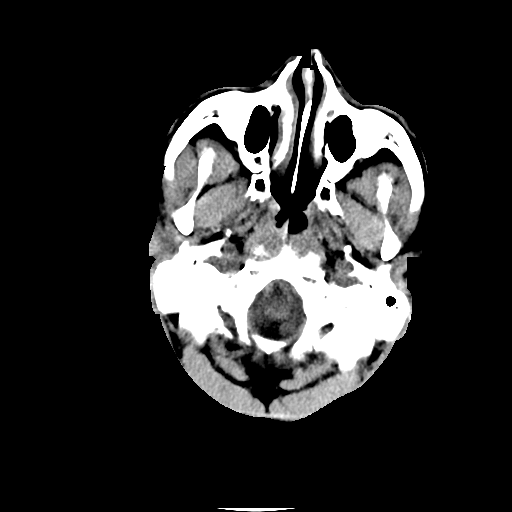
[im 3/31  bone]
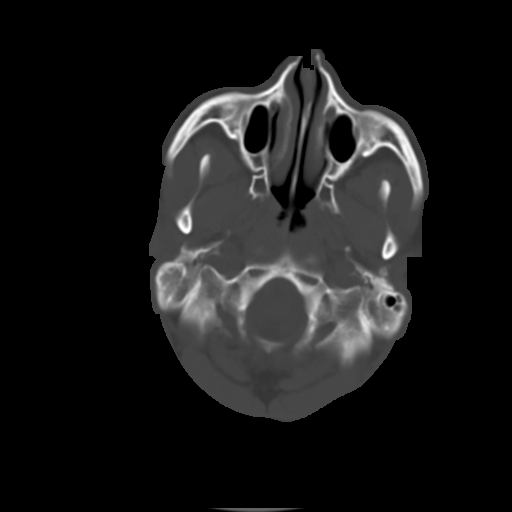
[im 5/31  brain]
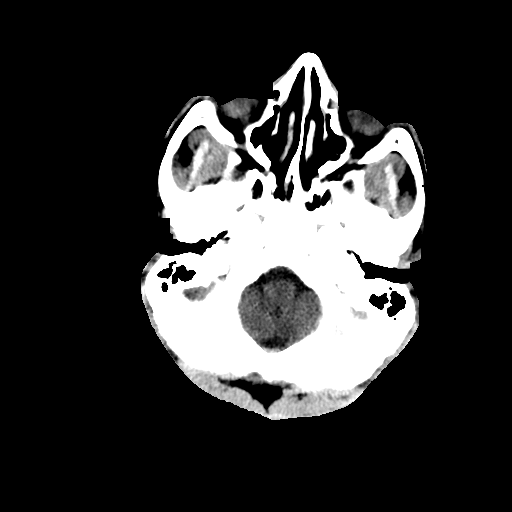
[im 7/31  brain]
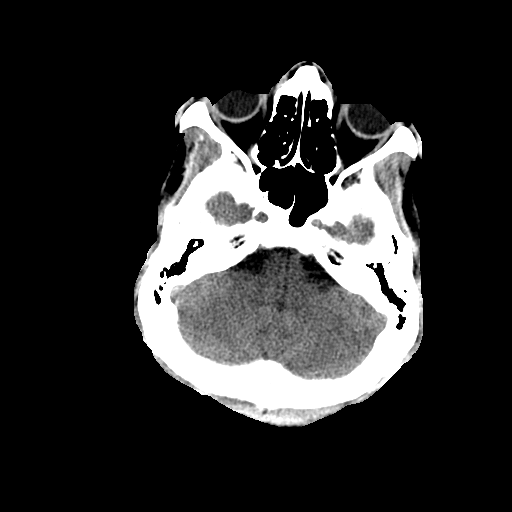
[im 9/31  brain]
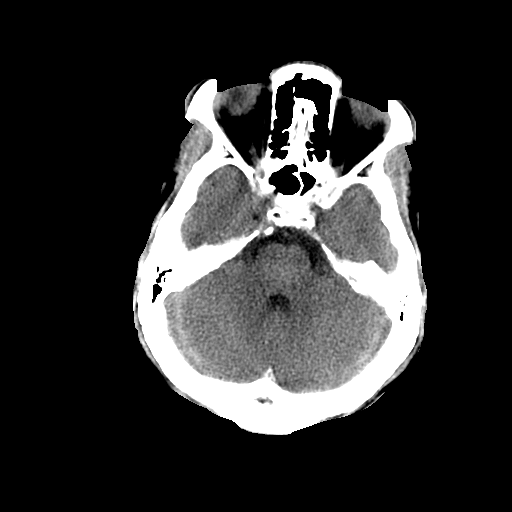
[im 11/31  brain]
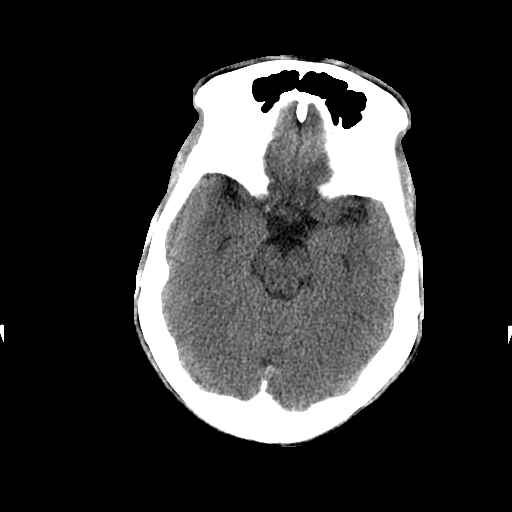
[im 11/31  bone]
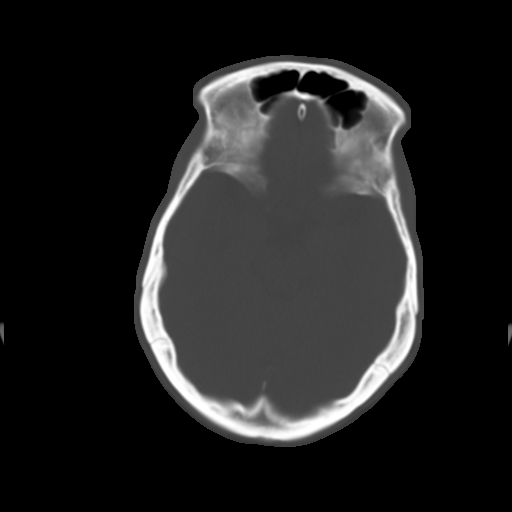
[im 13/31  brain]
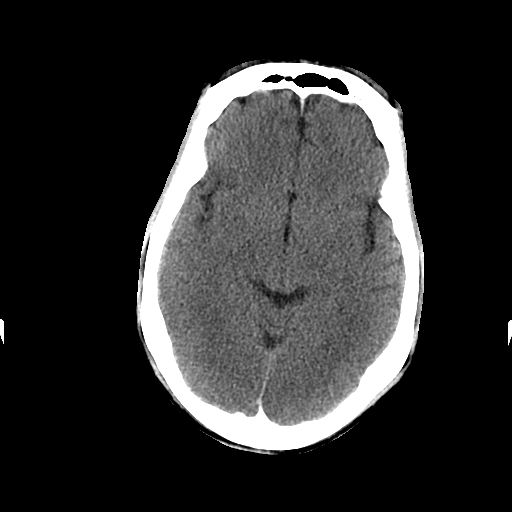
[im 16/31  brain]
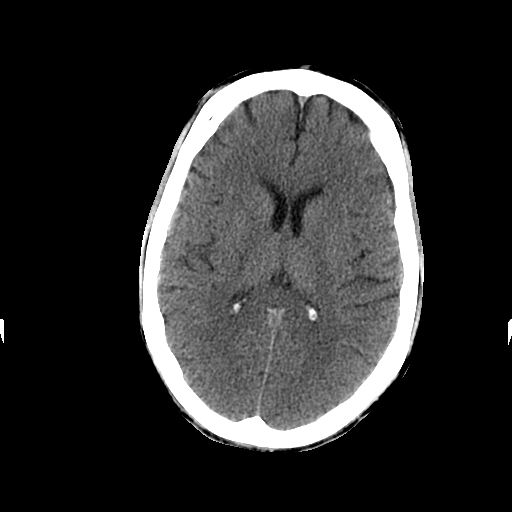
[im 18/31  brain]
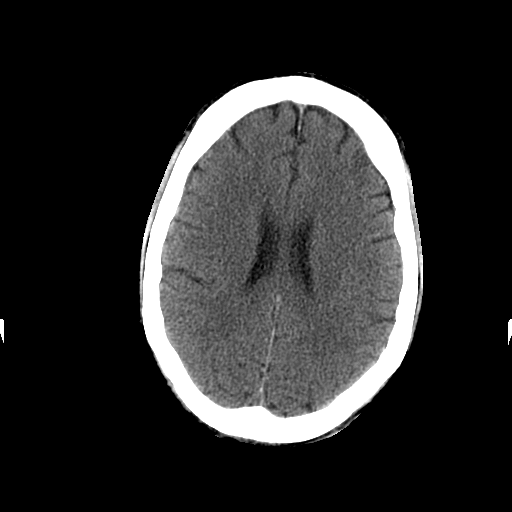
[im 20/31  brain]
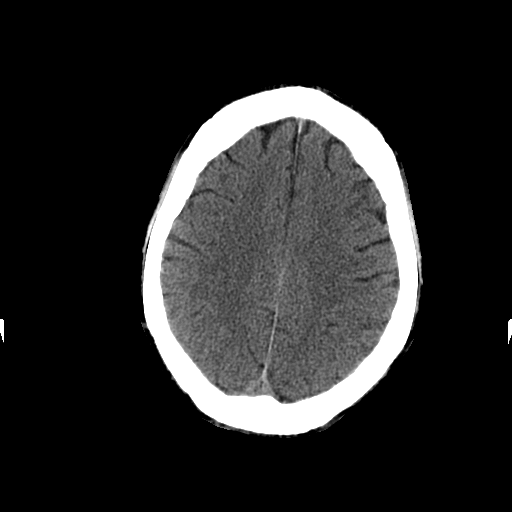
[im 20/31  bone]
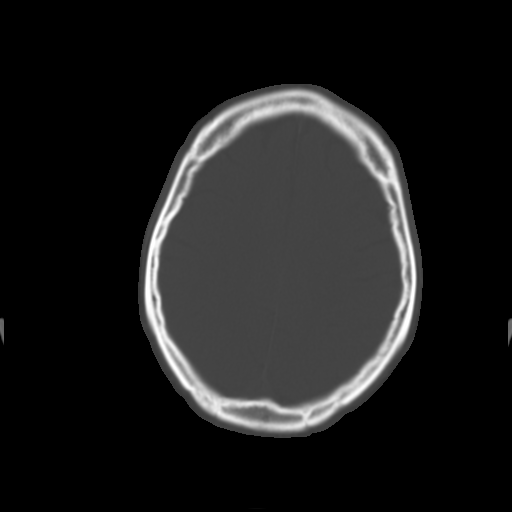
[im 22/31  brain]
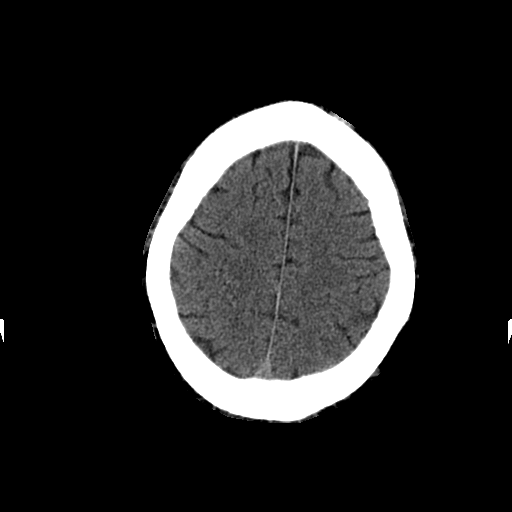
[im 24/31  brain]
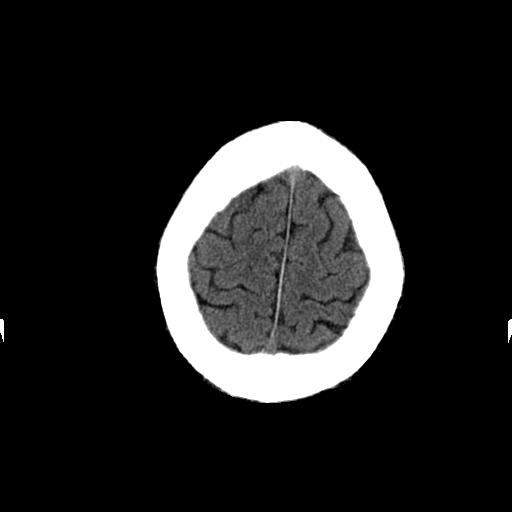
[im 26/31  brain]
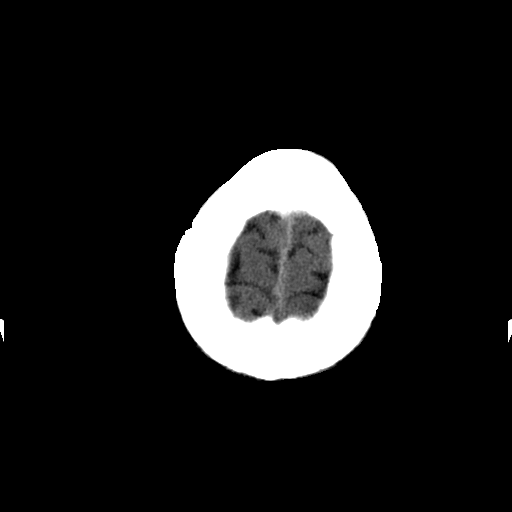
[im 28/31  brain]
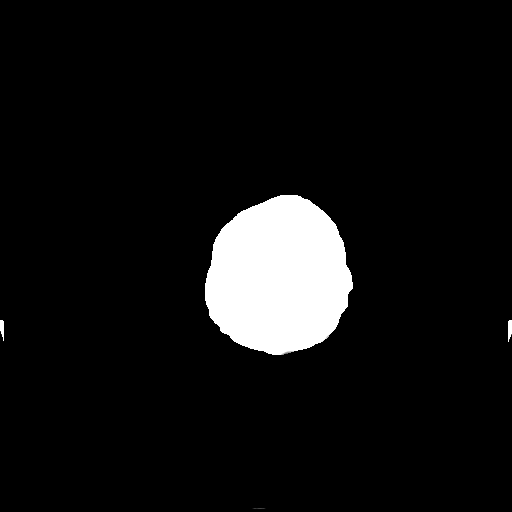
[im 28/31  bone]
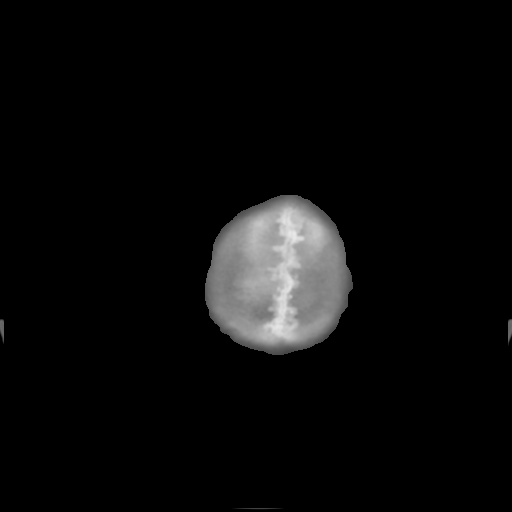

[Series 202: head w/o bone, idose (1) · axial · non-contrast · 0.49mm/px · z∈[+66,+106]mm · 3 of 31 slices shown]
[im 3/31  bone]
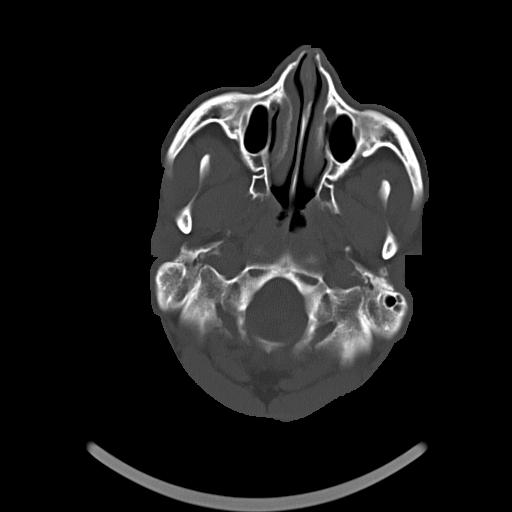
[im 7/31  bone]
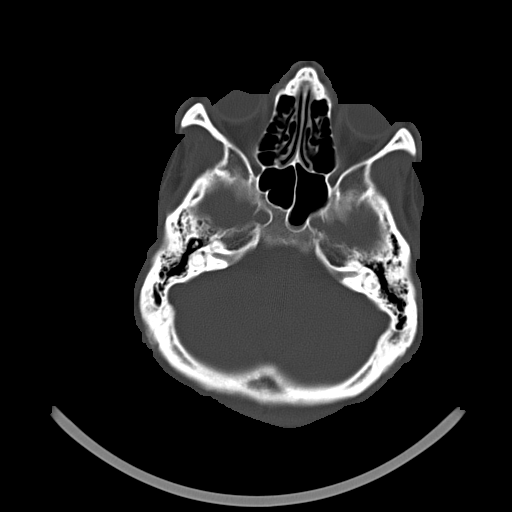
[im 11/31  bone]
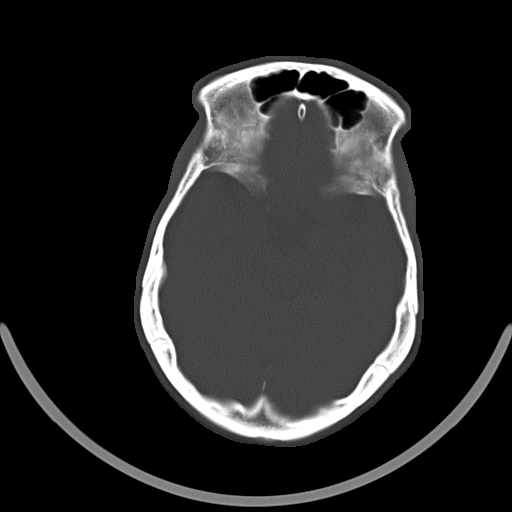

[16 of 30 positions shown; findings below may reference images not displayed]

FINDINGS: No skull fracture is noted. Paranasal sinuses and mastoid air cells
are unremarkable. No intracranial hemorrhage, mass effect or midline
shift. No acute infarction. No mass lesion is noted on this
unenhanced scan. Ventricular size is stable from prior exam.
IMPRESSION: No acute intracranial abnormality.  No significant change.

## 2017-03-18 IMAGING — MR MR PELVIS WO/W CM
4 of 8 series · 19 of 48 positions shown · IV contrast (multihance)
Comparison: Pelvic CT 12/11/2014.  MRI 11/10/2014.

CLINICAL DATA: Follow up decubitus ulcers/ osteomyelitis.
Subsequent encounter.

EXAM:
MRI PELVIS WITHOUT AND WITH CONTRAST
TECHNIQUE: Multiplanar multisequence MR imaging of the pelvis was performed
both before and after administration of intravenous contrast.
CONTRAST:  12mL MULTIHANCE GADOBENATE DIMEGLUMINE 529 MG/ML IV SOLN

[Series 4: T1 · coronal · 5.0mm · 0.70mm/px · 6 of 24 slices shown (1 of 2)]
[im 1/24]
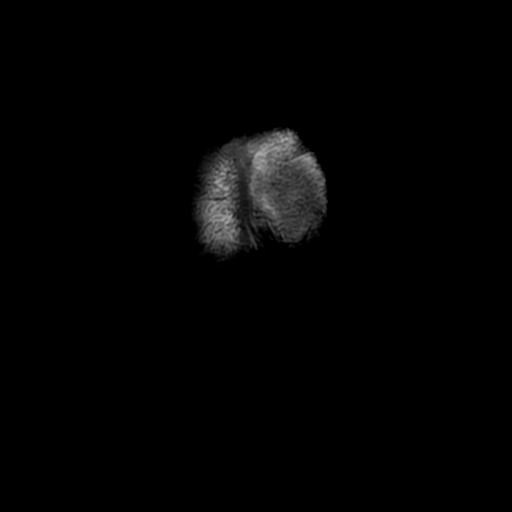
[im 5/24]
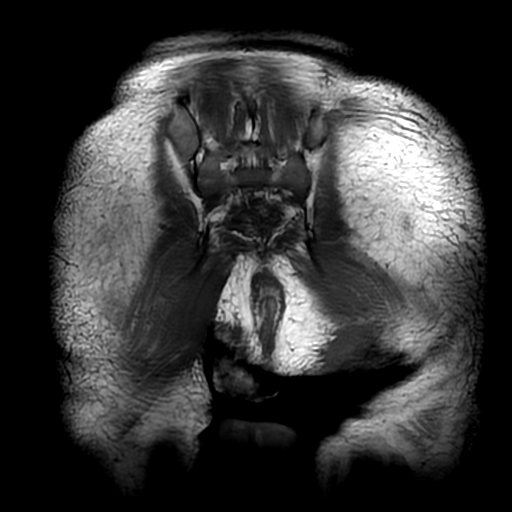
[im 10/24]
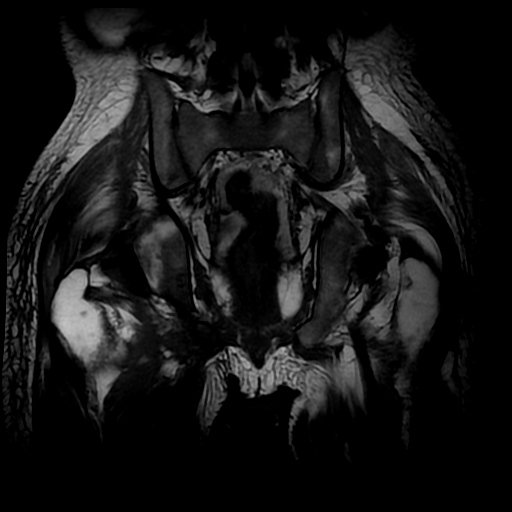
[im 14/24]
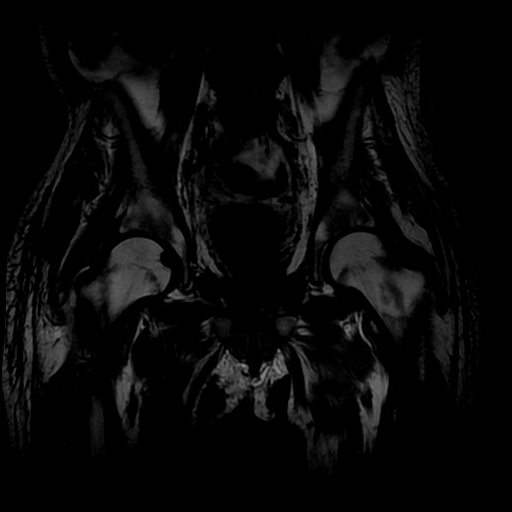
[im 19/24]
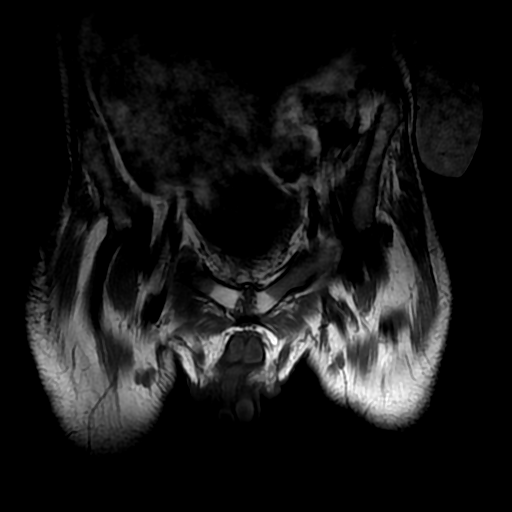
[im 24/24]
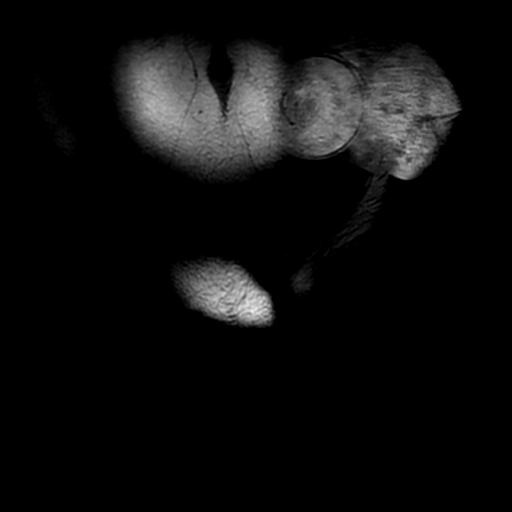

[Series 5: cor fse ir · coronal · 5.0mm · 0.70mm/px · 5 of 24 slices shown]
[im 1/24]
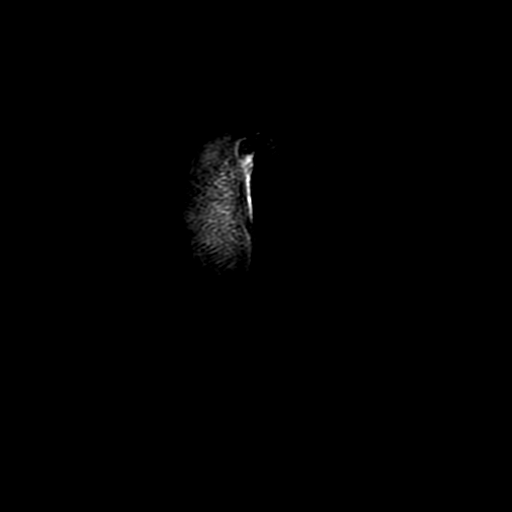
[im 6/24]
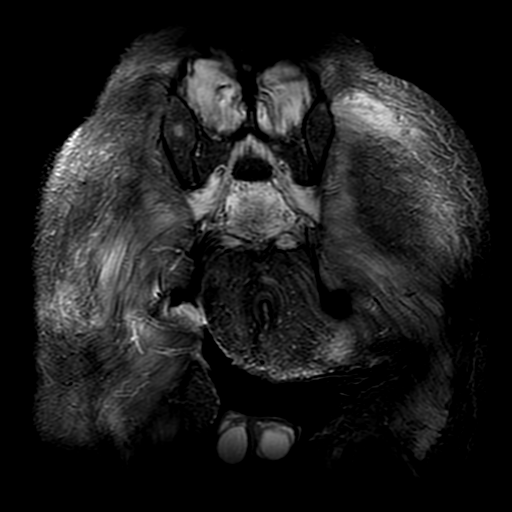
[im 12/24]
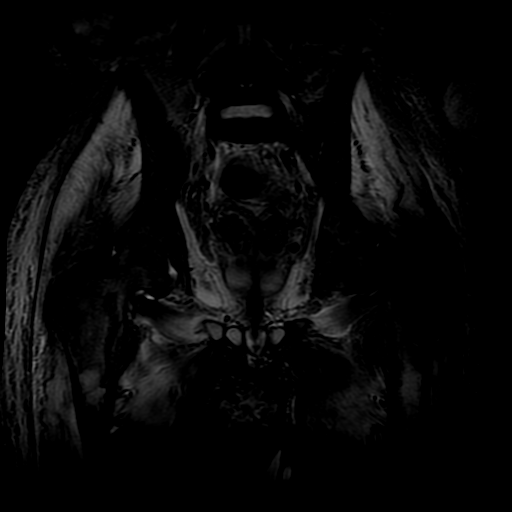
[im 18/24]
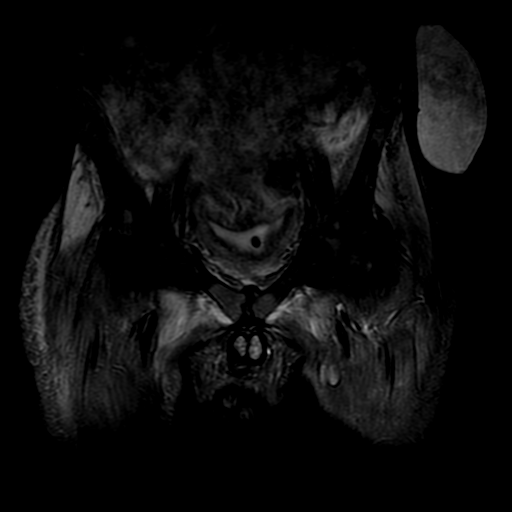
[im 24/24]
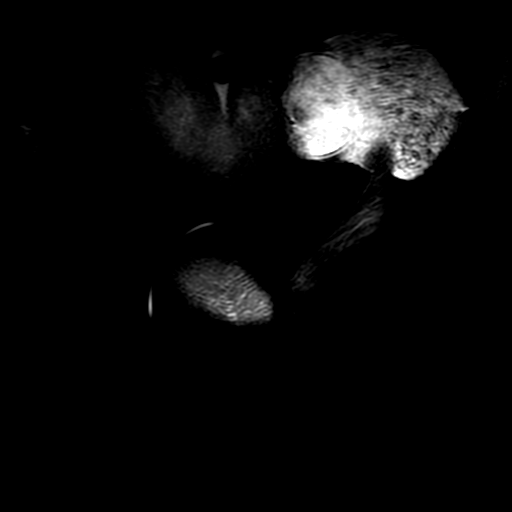

[Series 6: T2 fat-sat · sagittal · 6.0mm · 0.59mm/px · 5 of 36 slices shown]
[im 1/36]
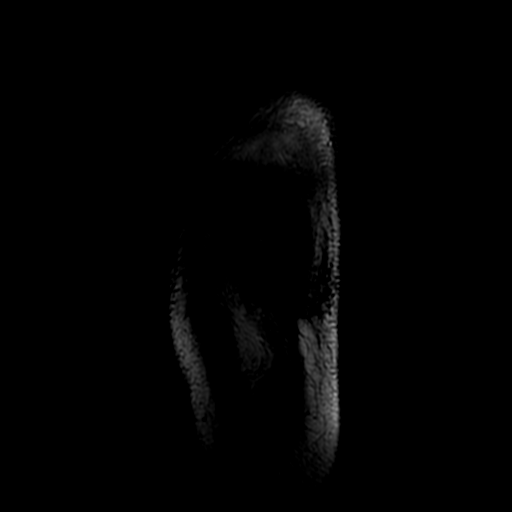
[im 6/36]
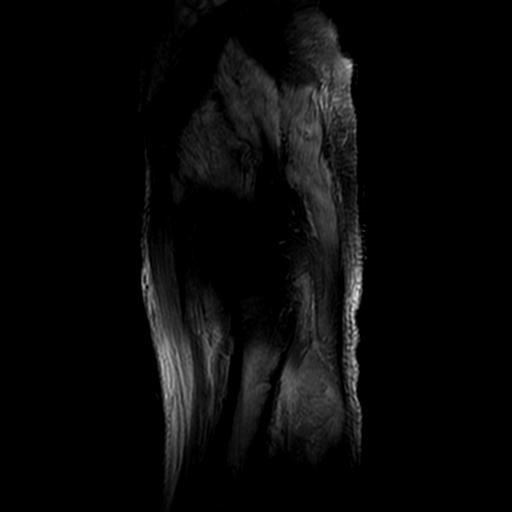
[im 11/36]
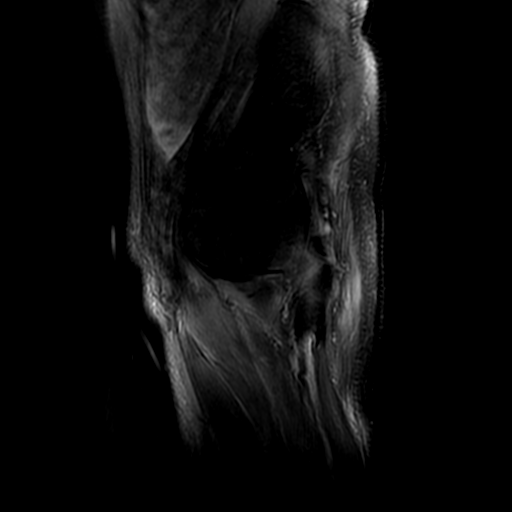
[im 21/36]
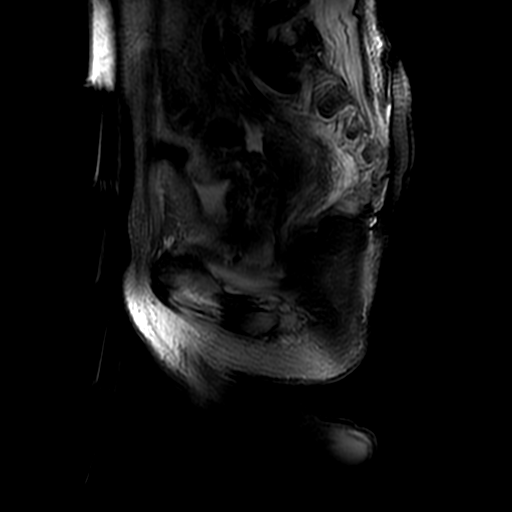
[im 31/36]
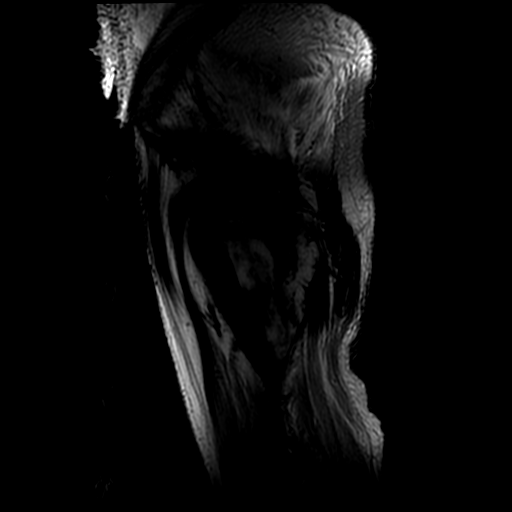

[Series 7: T1 · axial · 6.0mm · 0.70mm/px · z∈[-49,+119]mm · 3 of 30 slices shown (2 of 2)]
[im 6/30]
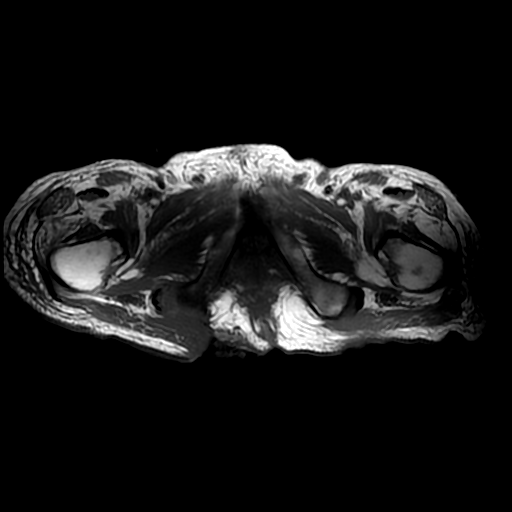
[im 18/30]
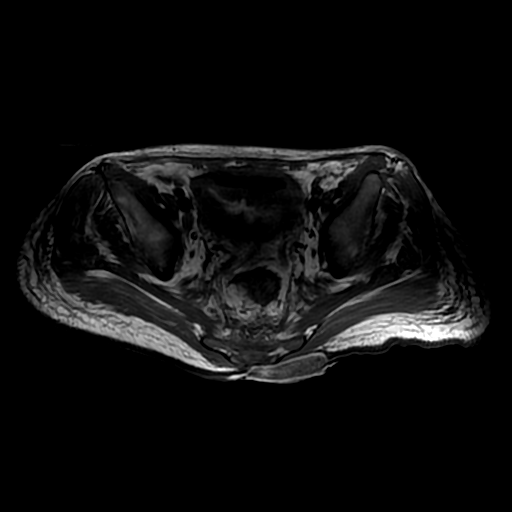
[im 30/30]
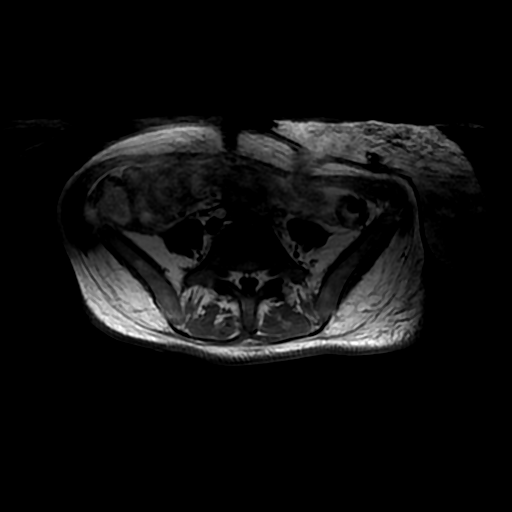

[19 of 48 positions shown; findings below may reference images not displayed]

FINDINGS: Large decubitus ulcer over the right ischial tuberosity is again
noted. This ulcer extends to the bone. Lesser decubitus ulceration
over the lower sacrum and left ischium is unchanged.

Generalized muscular edema and enhancement within the pelvis and
proximal thighs is similar to the prior examination. No residual
fluid collections are seen within the right adductor musculature.
Subcutaneous edema has improved compared with the prior study.

The extent of T2 hyperintensity and enhancement within the right
ischial tuberosity has mildly progressed compared with the prior
MRI. No progressive bone destruction is identified compared with the
more recent CT. There is no suspicious signal or enhancement within
the left ischium or sacrum.

The femoral heads are stable in appearance. There is no significant
residual hip joint effusion or suspicious synovial enhancement.

Suprapubic bladder catheter and left lower quadrant ostomy again
noted. Some free fluid noted in the peritoneal cavity.
IMPRESSION: 1. Persistent large decubitus ulceration over the right ischial
tuberosity, extending to the cortex. Underlying osteomyelitis within
the ischium has mildly progressed compared with the MRI of 2 months
ago.
2. No residual focal soft tissue abscess.
3. Generalized muscular edema and enhancement are similar to the
prior examination. The subcutaneous edema has improved.
4. No significant hip findings.

## 2017-03-20 IMAGING — CR DG CHEST 1V PORT
1 series · 1 of 1 positions shown · non-contrast
Comparison: 01/06/2015

CLINICAL DATA: Fever.  Diabetic foot ulcer.

EXAM:
PORTABLE CHEST - 1 VIEW

[portable]
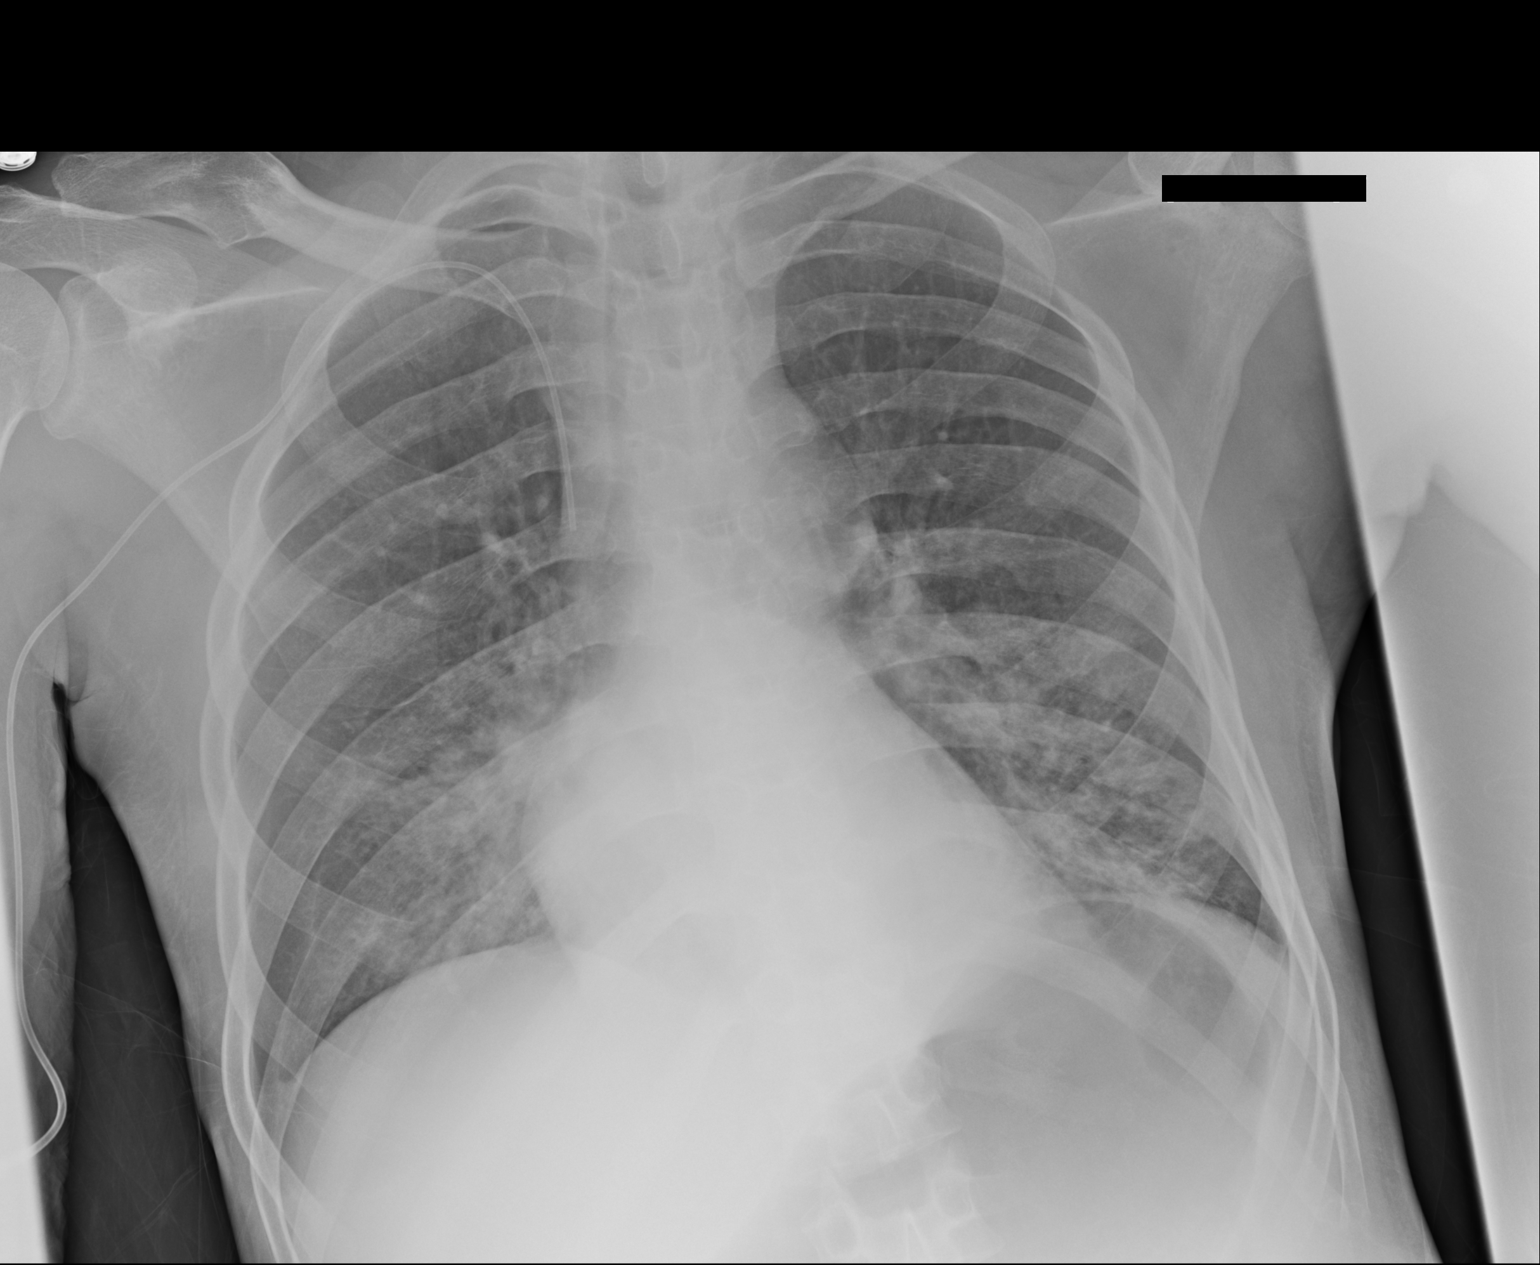

[1 of 1 positions shown; findings below may reference images not displayed]

FINDINGS: Right arm PICC line remains in appropriate position.

Worsening symmetric bilateral mid and lower lung airspace disease is
seen, which may be due to worsening edema or pneumonia. No
significant pleural effusion seen on this portable exam. Heart size
is normal.
IMPRESSION: Worsening symmetric mid and lower lung airspace disease, which may
be due to worsening pulmonary edema or pneumonia.

## 2017-03-21 IMAGING — CT CT CHEST W/ CM
2 of 4 series · 13 of 36 positions shown, 16 images · IV contrast (APPLIED)
Comparison: None.

CLINICAL DATA: Fever of unknown origin

EXAM:
CT CHEST, ABDOMEN, AND PELVIS WITH CONTRAST
TECHNIQUE: Multidetector CT imaging of the chest, abdomen and pelvis was
performed following the standard protocol during bolus
administration of intravenous contrast.
CONTRAST:  80mL OMNIPAQUE IOHEXOL 300 MG/ML  SOLN

[Series 2: cap 5.0 i31f 1 · axial · 0.64mm/px · z∈[+1100,+1660]mm · 10 of 128 slices shown, 13 images]
[im 8/128  mediastinal]
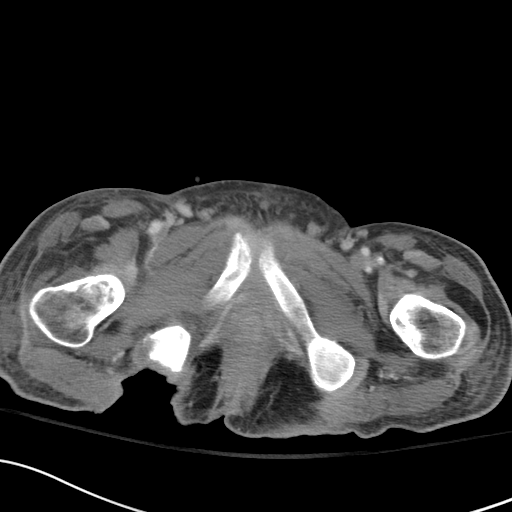
[im 8/128  lung]
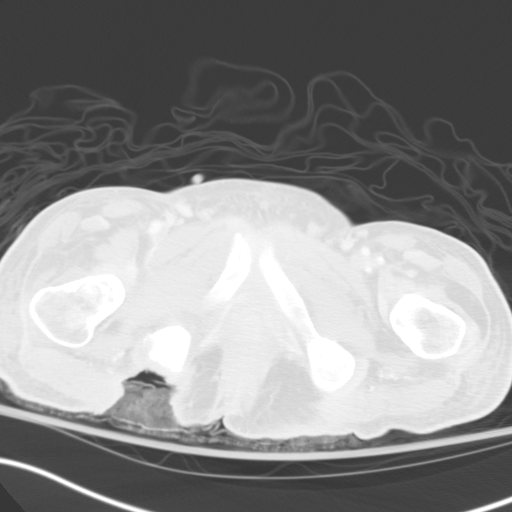
[im 22/128  lung]
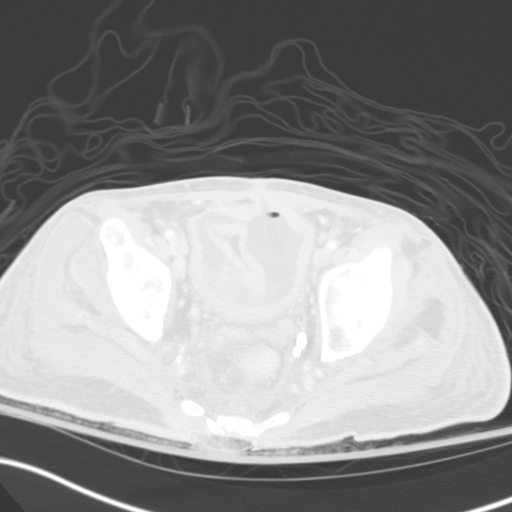
[im 36/128  lung]
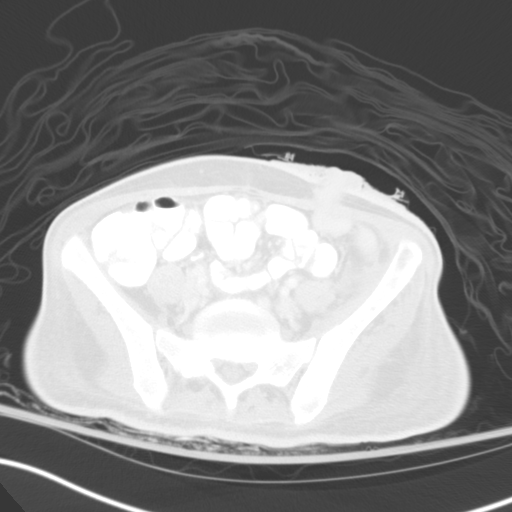
[im 43/128  lung]
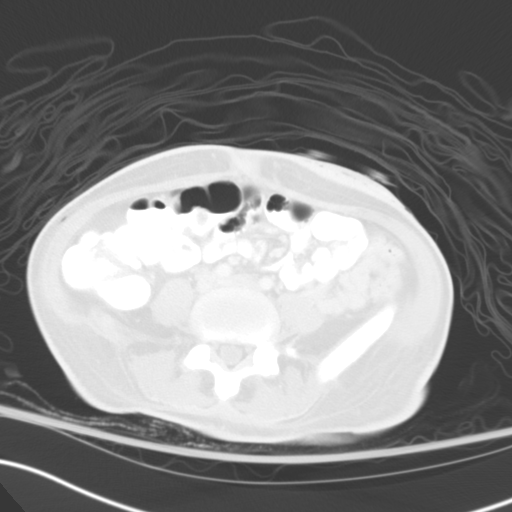
[im 57/128  mediastinal]
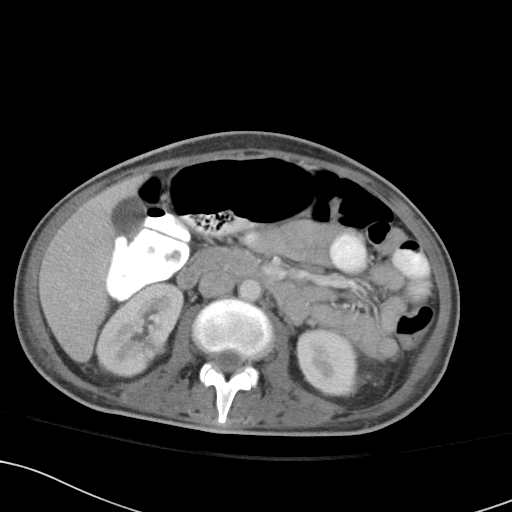
[im 57/128  lung]
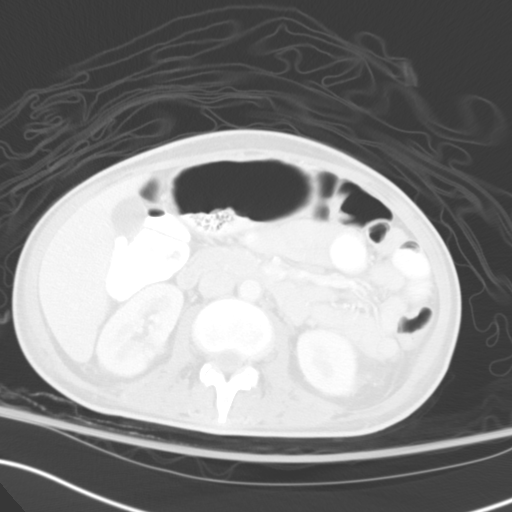
[im 71/128  lung]
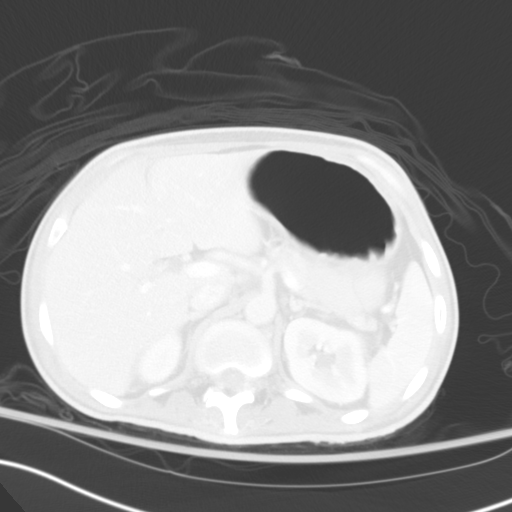
[im 85/128  lung]
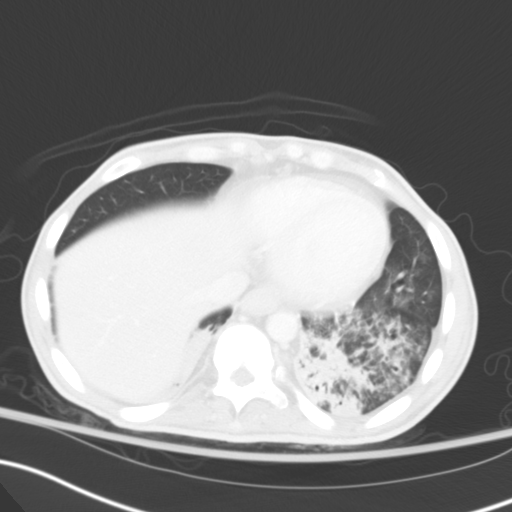
[im 92/128  lung]
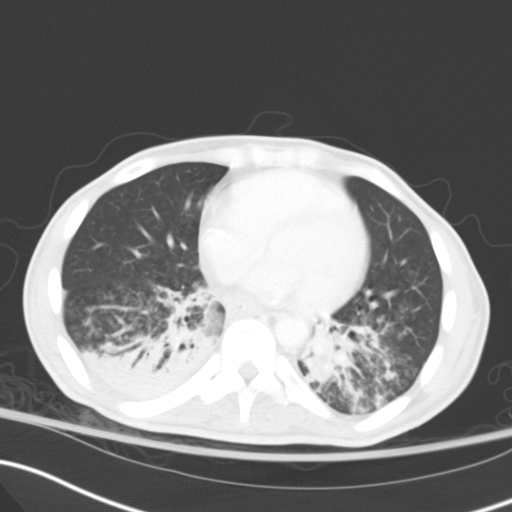
[im 106/128  mediastinal]
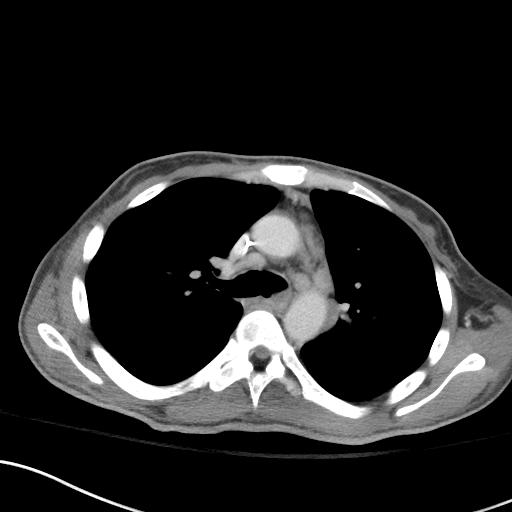
[im 106/128  lung]
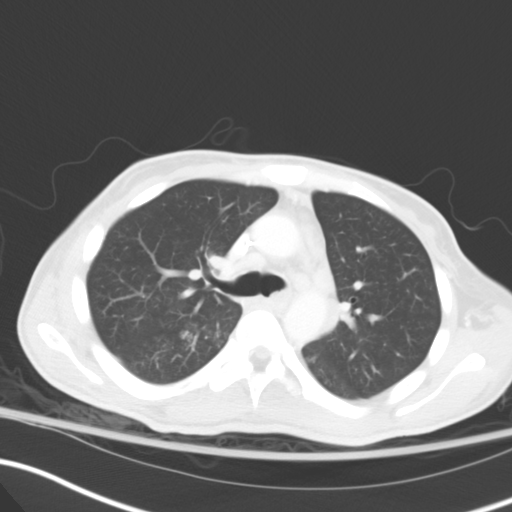
[im 120/128  lung]
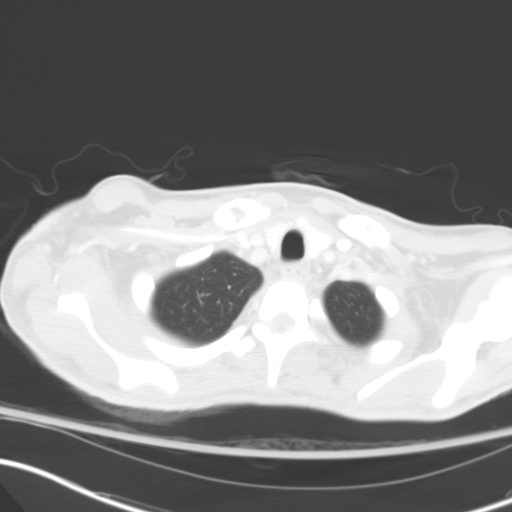

[Series 5: coronal · coronal · 0.65mm/px · 3 of 85 slices shown]
[im 17/85  lung]
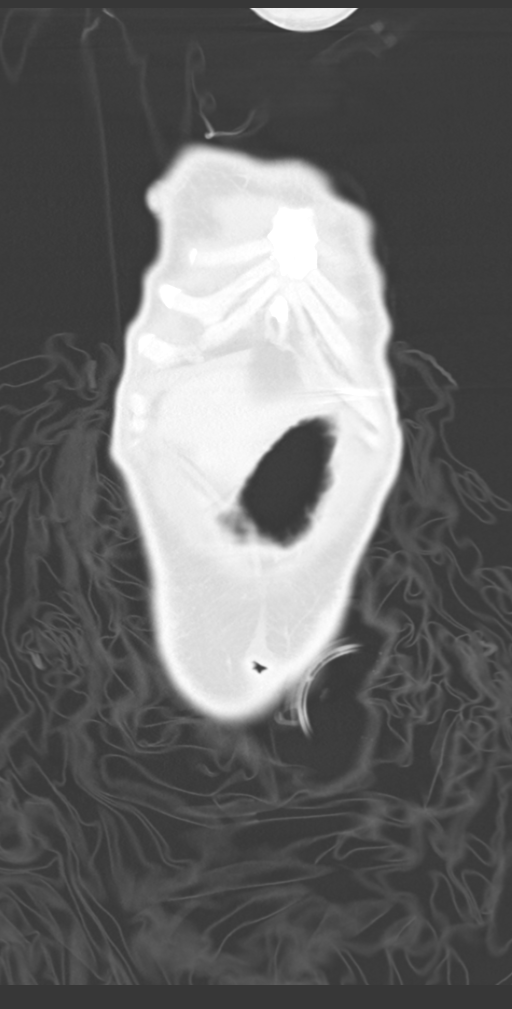
[im 34/85  lung]
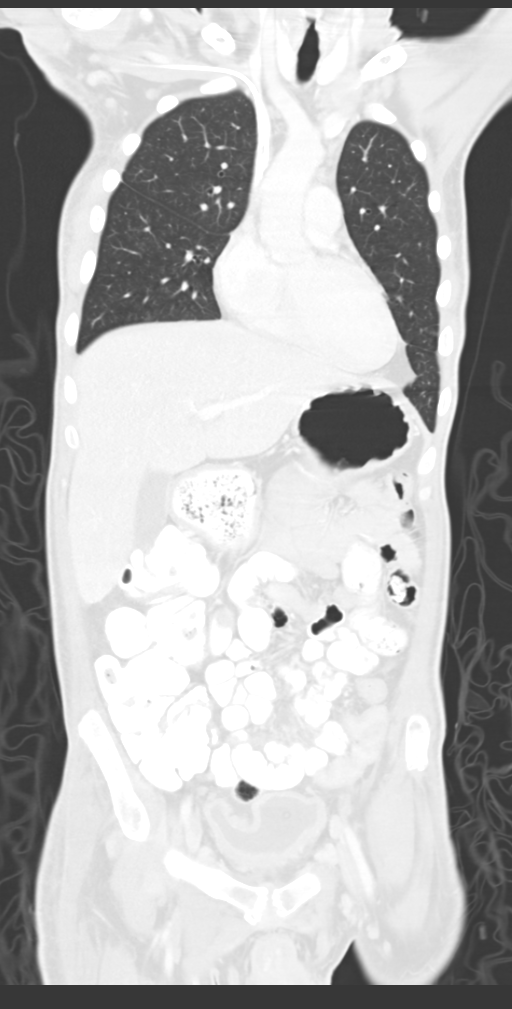
[im 51/85  lung]
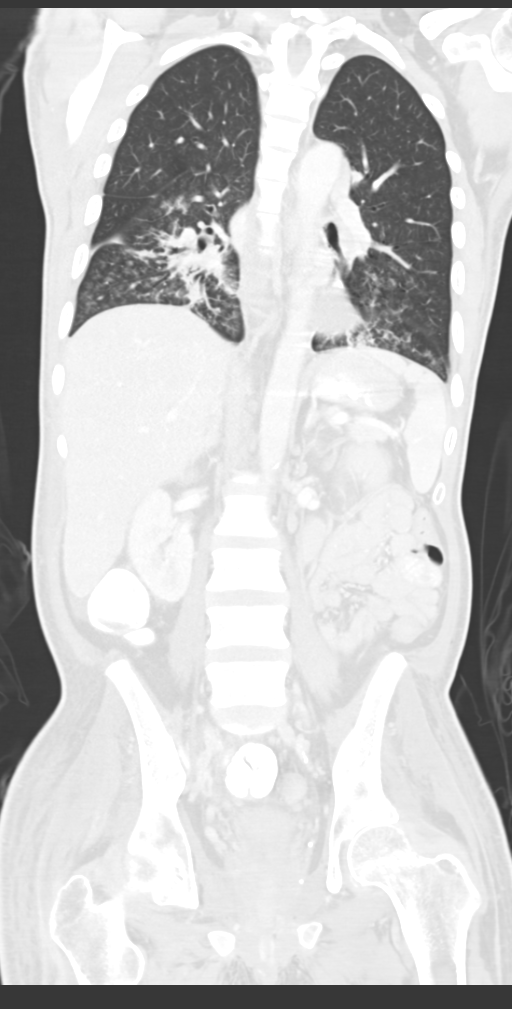

[13 of 36 positions shown; findings below may reference images not displayed]

FINDINGS: CT CHEST FINDINGS

The lungs are well aerated bilaterally. Patchy pneumonia is
identified in the lower lobes similar to that seen on the prior
plain film examination. The upper lobes are relatively spared.

The thoracic inlet is within normal limits. A right-sided PICC line
is noted in satisfactory position. The thoracic aorta and pulmonary
artery are within normal limits. Scattered small hilar and
mediastinal lymph nodes are seen likely of reactive nature these are
most prominent in the aorticopulmonary window.

No acute bony abnormality is noted.

CT ABDOMEN AND PELVIS FINDINGS

The liver, gallbladder, spleen,, adrenal glands and pancreas are
normal in their CT appearance. Left kidney is well visualized. No
renal calculi or obstructive changes are noted. Right kidney again
demonstrates some scarring medially stable from the prior exam.

The bladder wall is thickened and enhanced but stable from the prior
exam. The bladder is partially decompressed by Foley catheter. Some
air is noted within the bladder related to the recent
instrumentation.

The appendix is within normal limits. No pelvic mass lesion is
noted. Decubitus ulcers are noted over the sacrum as well as over
the right ischial tuberosity. The ischial tuberosity decubitus ulcer
appears more prominent and this may be related to recent
debridement. Underlying bony erosion is noted in the ischial
tuberosity which is more prominent than that seen on the prior exam
consistent with underlying osteomyelitis.
IMPRESSION: Bilateral lower lobe pneumonia which is increased in the interval
from the prior exam.

Thickening of the bladder wall which is stable from the prior study.

Decubitus ulcers with changes of osteomyelitis within the right
ischium posteriorly. The degree of bony erosion appears to have
progressed in the interval from the prior study.

## 2017-03-21 IMAGING — MR MR FOOT*L* W/O CM
4 of 5 series · 25 of 40 positions shown · non-contrast
Comparison: 11/17/2014 radiographs hand prior MRI 09/16/2014

CLINICAL DATA: Diabetic foot ulcers

EXAM:
MRI OF THE LEFT FOREFOOT WITHOUT CONTRAST
TECHNIQUE: Multiplanar, multisequence MR imaging was performed. No intravenous
contrast was administered.

[Series 5: T1 · coronal · 5.0mm · 0.27mm/px · 11 of 42 slices shown (1 of 2)]
[im 1/42]
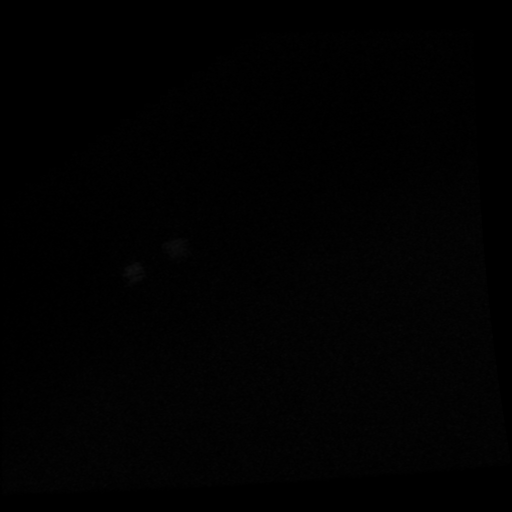
[im 5/42]
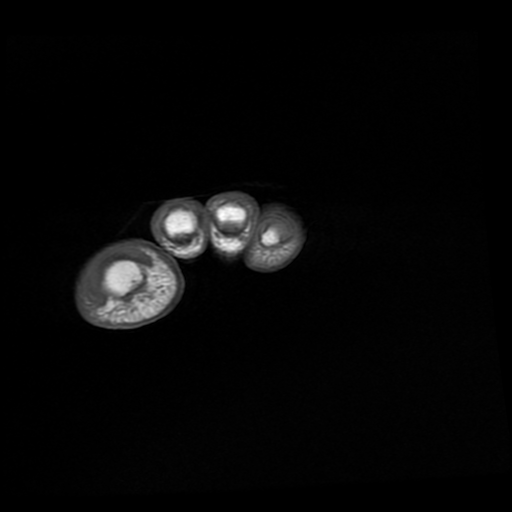
[im 9/42]
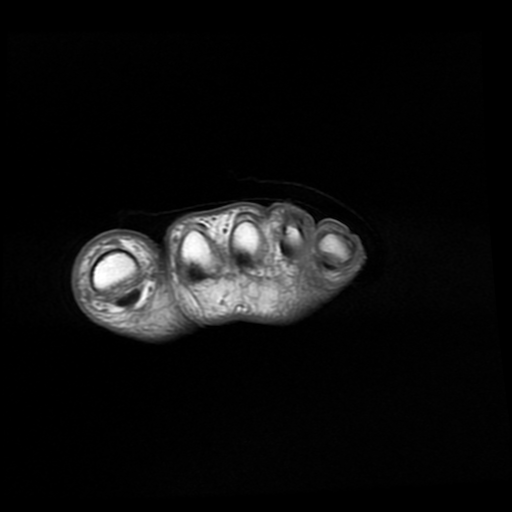
[im 13/42]
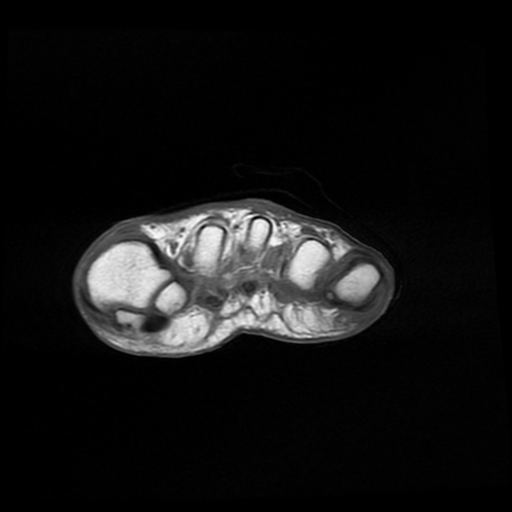
[im 17/42]
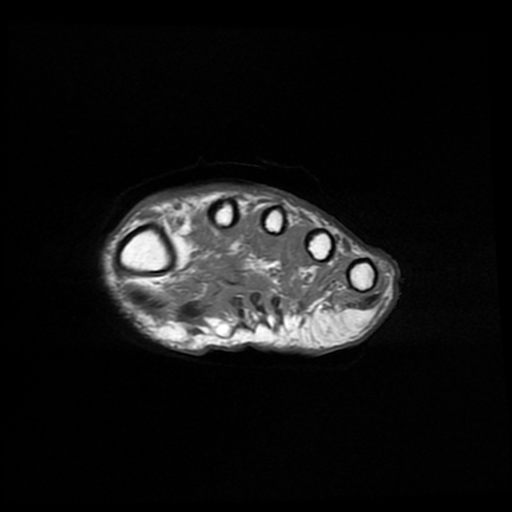
[im 21/42]
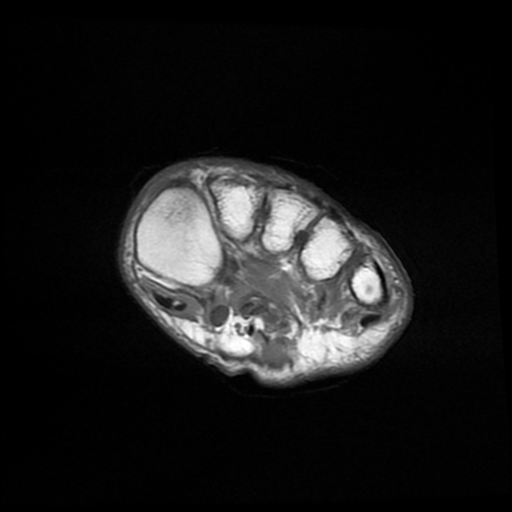
[im 25/42]
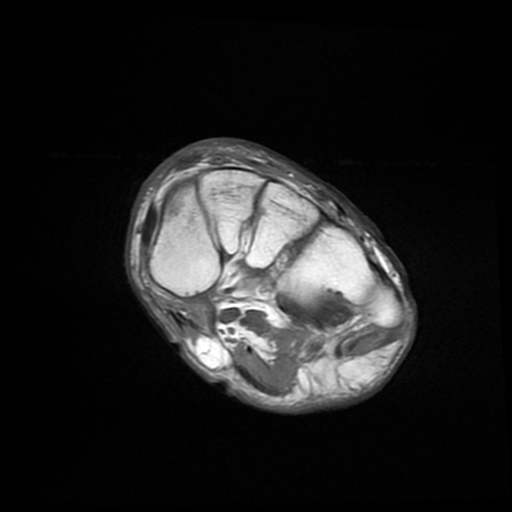
[im 29/42]
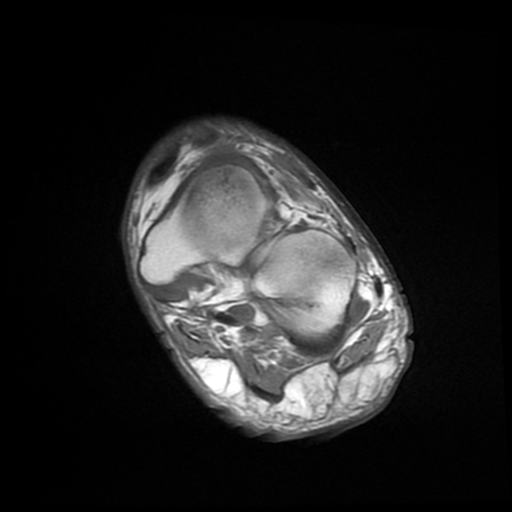
[im 33/42]
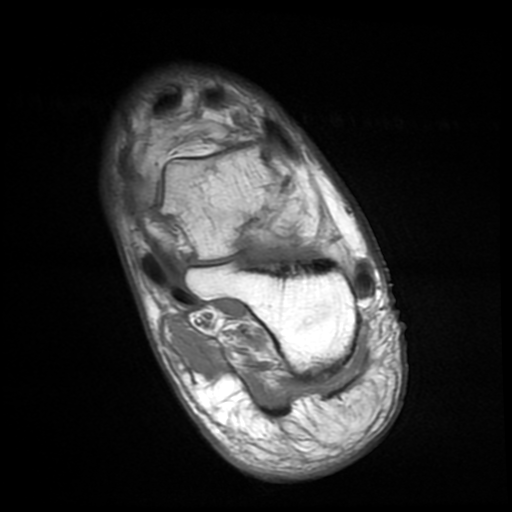
[im 37/42]
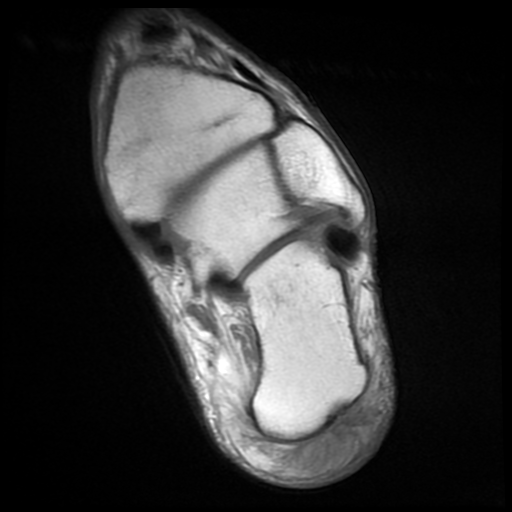
[im 42/42]
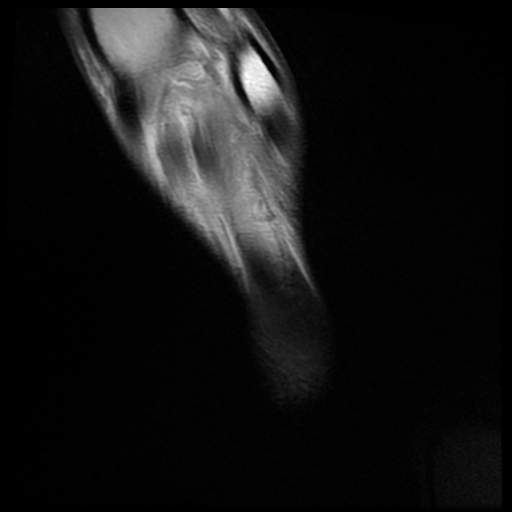

[Series 6: T2 fat-sat · coronal · 5.0mm · 0.55mm/px · 8 of 42 slices shown]
[im 1/42]
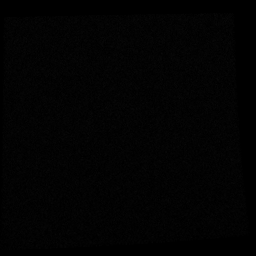
[im 5/42]
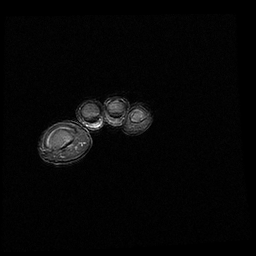
[im 14/42]
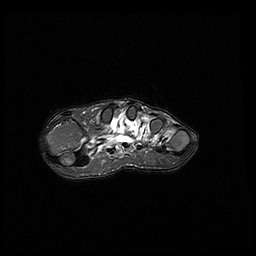
[im 19/42]
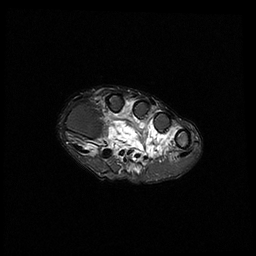
[im 23/42]
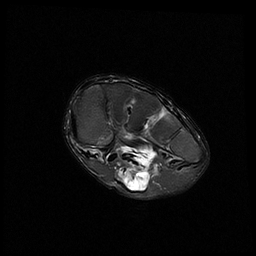
[im 28/42]
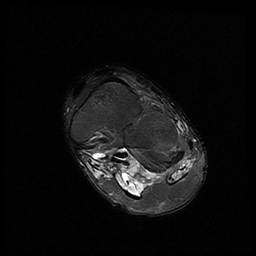
[im 37/42]
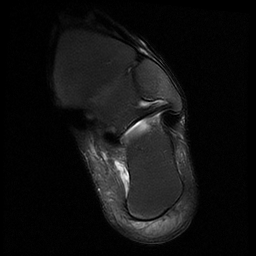
[im 42/42]
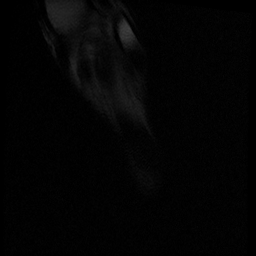

[Series 7: STIR · oblique · 4.0mm · 0.51mm/px · 3 of 25 slices shown]
[im 5/25]
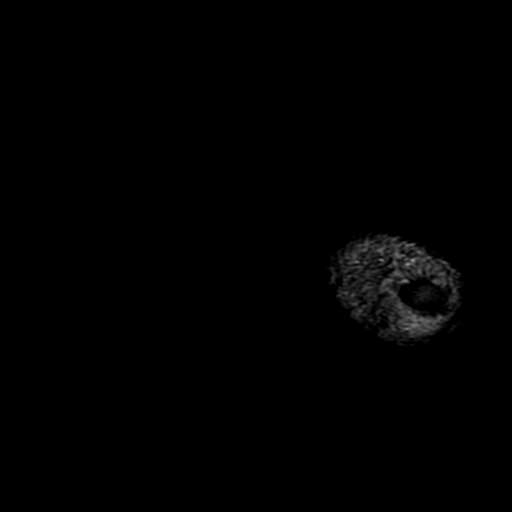
[im 15/25]
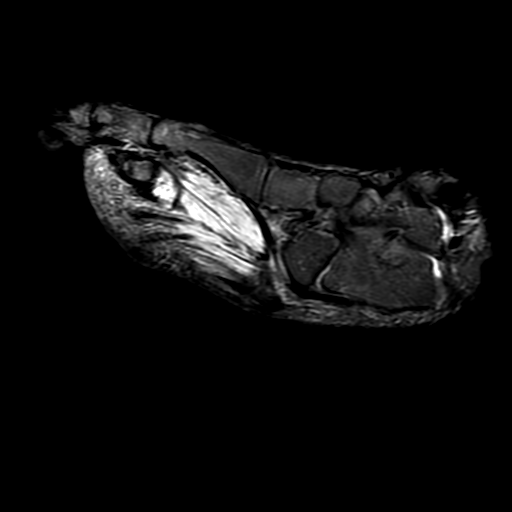
[im 25/25]
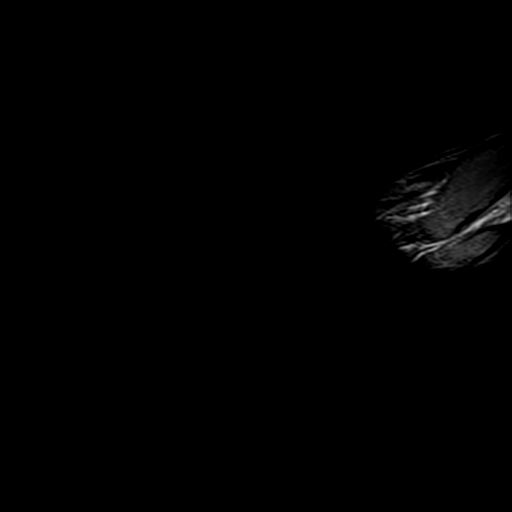

[Series 10: T1 · oblique · 4.0mm · 0.51mm/px · 3 of 25 slices shown (2 of 2)]
[im 5/25]
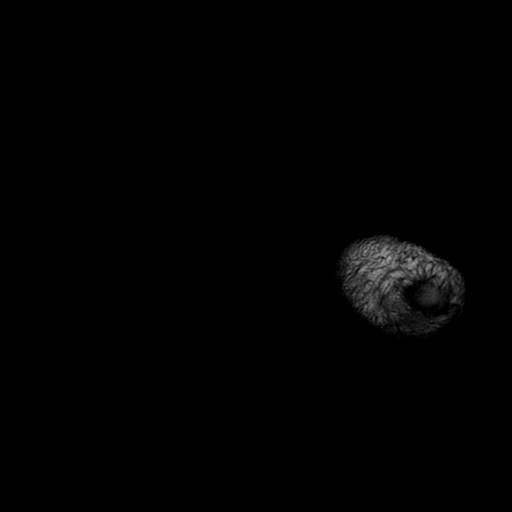
[im 15/25]
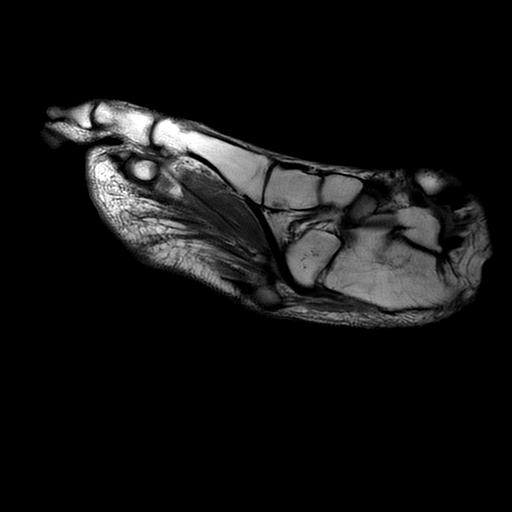
[im 25/25]
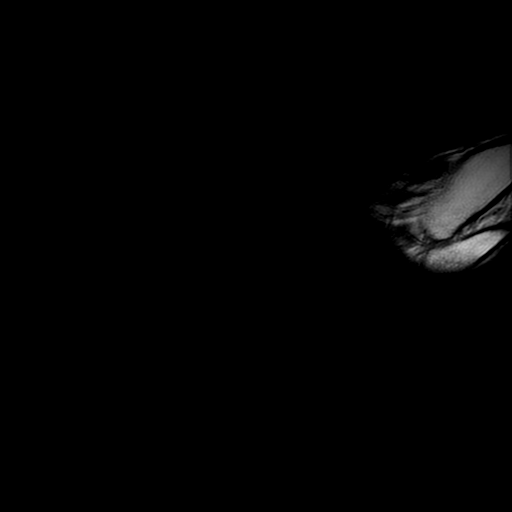

[25 of 40 positions shown; findings below may reference images not displayed]

FINDINGS: No definite MR findings to suggest septic arthritis or
osteomyelitis. No significant changes of cellulitis. There is
diffuse edema like signal abnormality in the short flexor muscles of
the foot suggesting myositis. No focal fluid collections to suggest
a drainable abscess.
IMPRESSION: No definite MR findings for septic arthritis or osteomyelitis.

No cellulitis or drainable soft tissue abscess.

Diffuse myositis.

## 2017-04-29 IMAGING — MR MR SACRUM / SI JOINTS WO/W CM
7 of 14 series · 18 of 48 positions shown · IV contrast (10    MH)
Comparison: CT scan of the abdomen and pelvis dated 01/11/2015 and
MRI of the pelvis dated 01/08/2015

CLINICAL DATA: Infected sacral decubitus ulcers. Fever.
Leukocytosis.

EXAM:
MRI LUMBAR SPINE WITHOUT CONTRAST, MRI OF THE SACRUM WITH AND
WITHOUT CONTRAST
TECHNIQUE: Multiplanar, multisequence MR imaging of the lumbar spine and sacrum
was performed. Contrast was performed for the sacral imaging.
CONTRAST:  10 cc MultiHance

[Series 2: T2 · sagittal · 4.0mm · 0.59mm/px · 1 of 15 slices shown (1 of 2)]
[im 1/15]
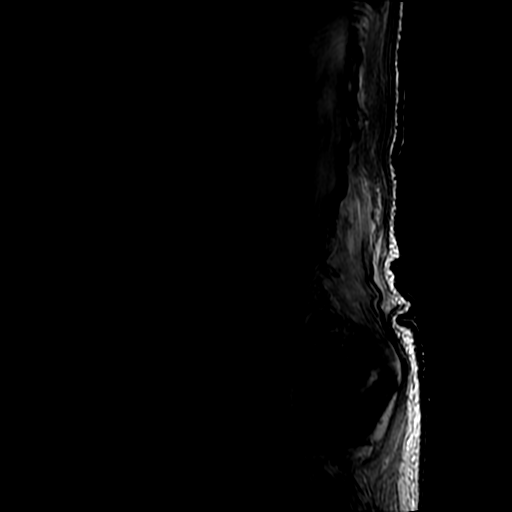

[Series 4: T1 · sagittal · 4.0mm · 0.59mm/px · 1 of 15 slices shown (1 of 5)]
[im 1/15]
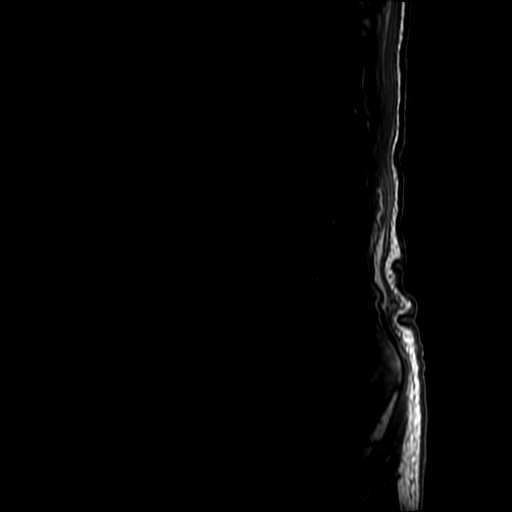

[Series 5: T2 · axial · 4.0mm · 0.39mm/px · z∈[+72,+310]mm · 3 of 47 slices shown (2 of 2)]
[im 1/47]
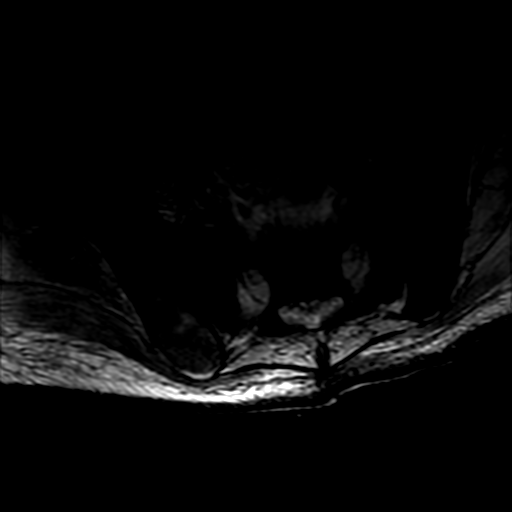
[im 24/47]
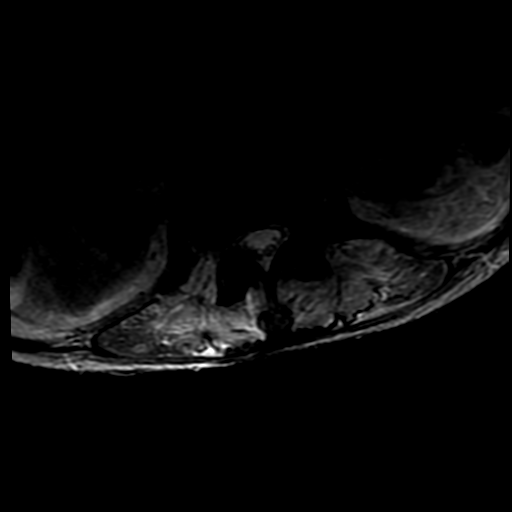
[im 47/47]
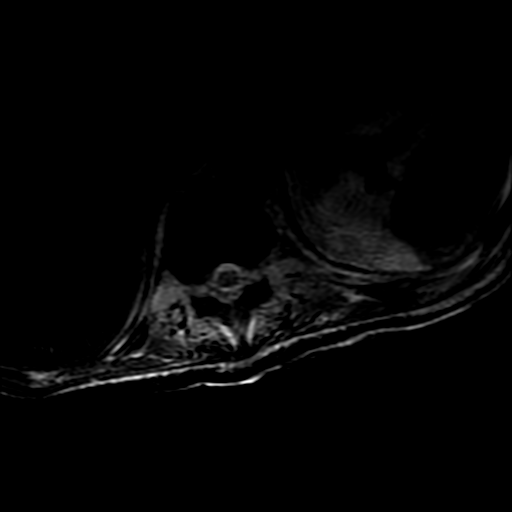

[Series 6: T1 · axial · 4.0mm · 0.39mm/px · z∈[+72,+310]mm · 3 of 47 slices shown (2 of 5)]
[im 1/47]
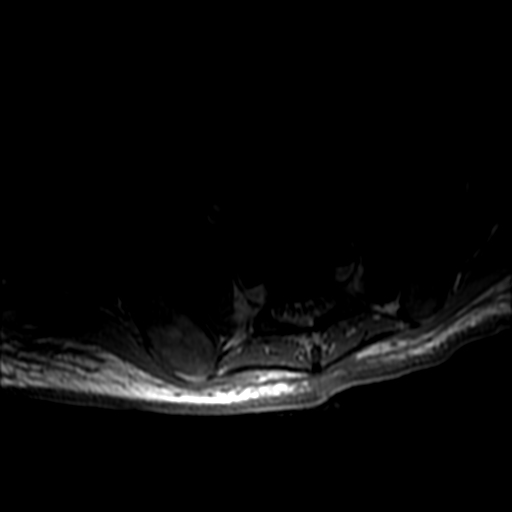
[im 24/47]
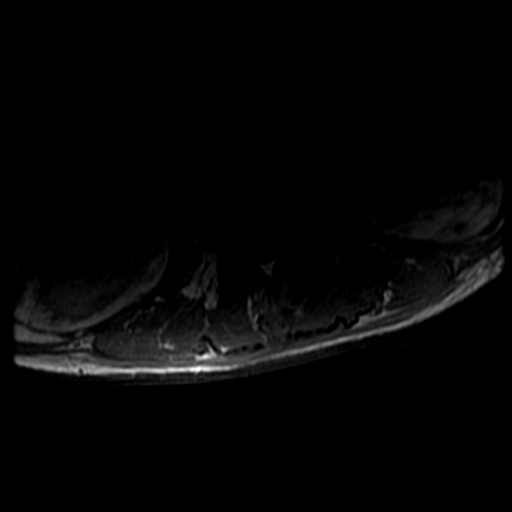
[im 47/47]
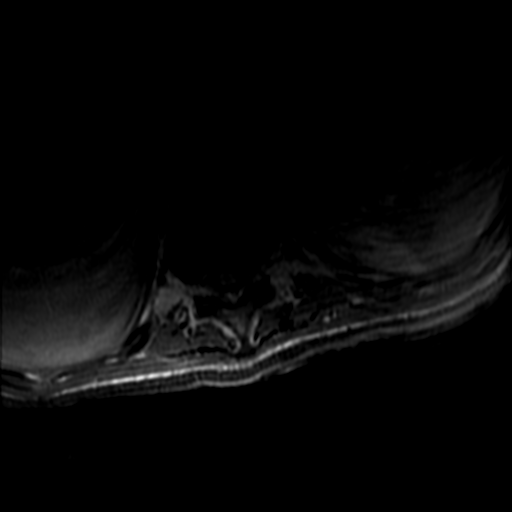

[Series 9: T1 · coronal · 3.0mm · 0.43mm/px · 3 of 49 slices shown (3 of 5)]
[im 1/49]
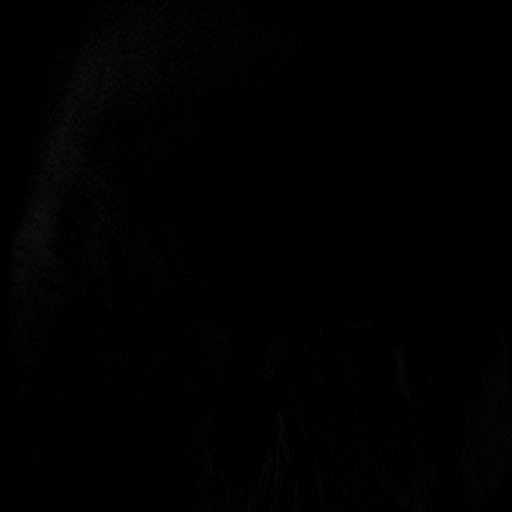
[im 25/49]
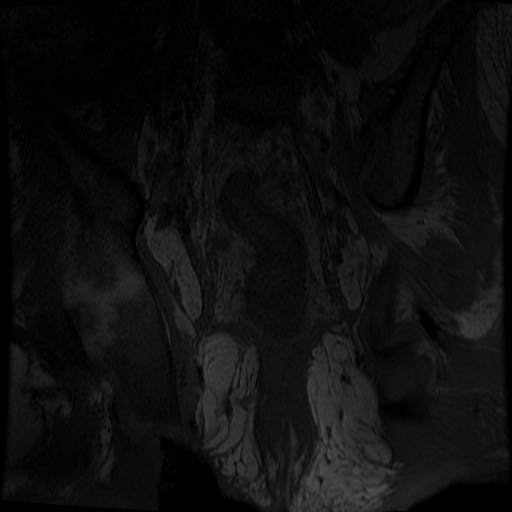
[im 49/49]
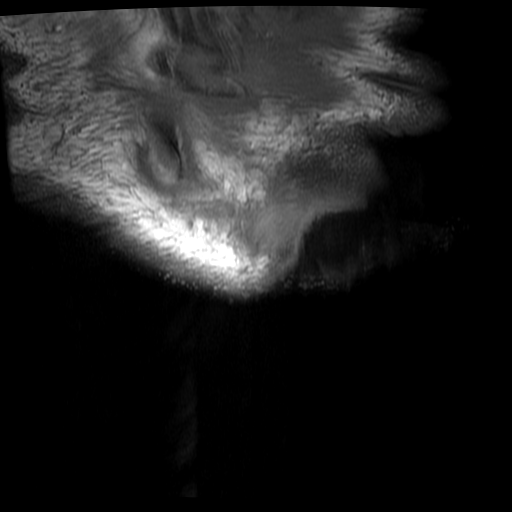

[Series 14: T1 · sagittal · 3.0mm · 0.43mm/px · 5 of 77 slices shown (4 of 5)]
[im 1/77]
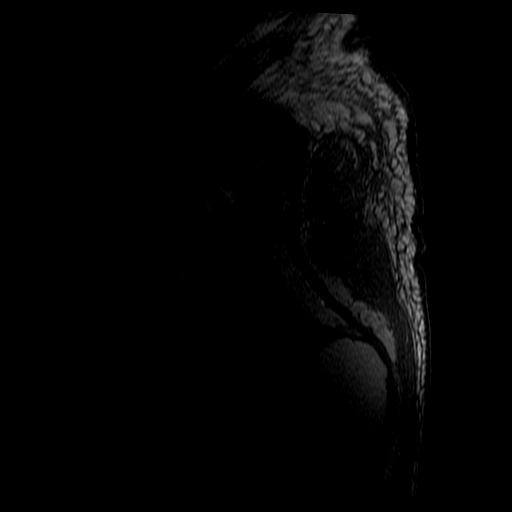
[im 20/77]
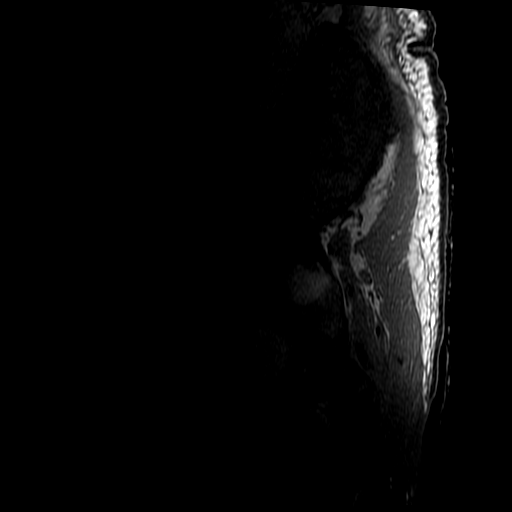
[im 39/77]
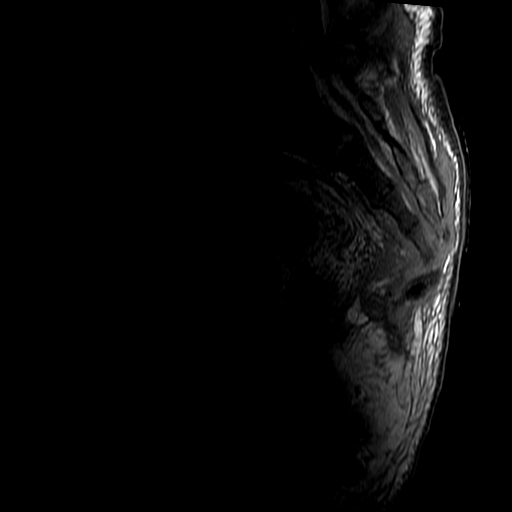
[im 58/77]
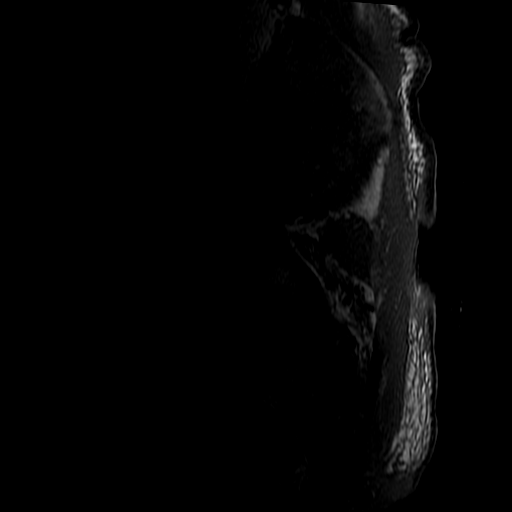
[im 77/77]
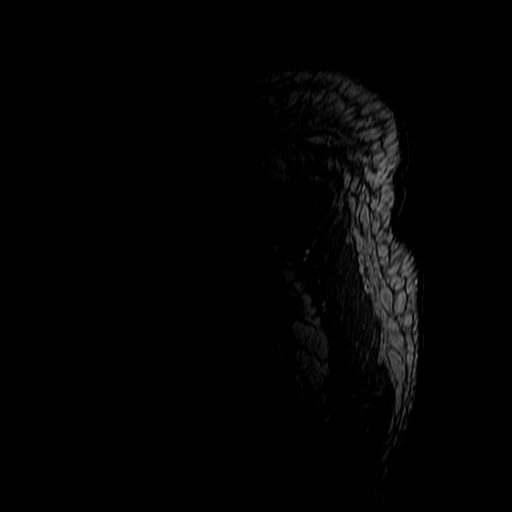

[Series 17: T1 · axial · 3.0mm · 0.59mm/px · z∈[-62,-9]mm · 2 of 76 slices shown (5 of 5)]
[im 1/76]
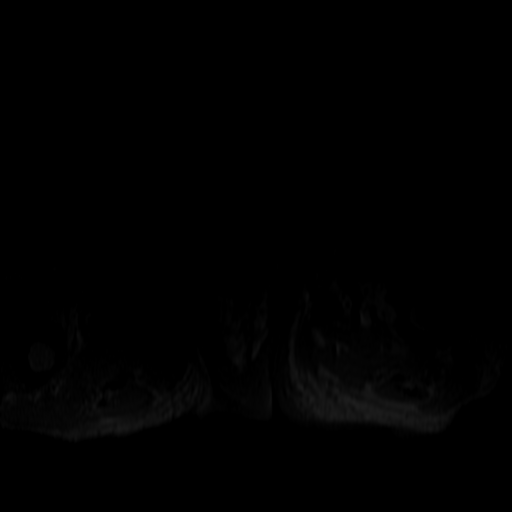
[im 19/76]
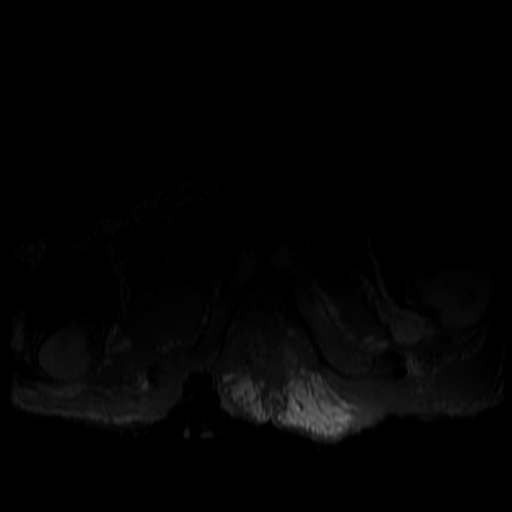

[18 of 48 positions shown; findings below may reference images not displayed]

FINDINGS: MRI of the lumbar spine:

Normal conus tip at L1-2. There is diffuse edema in the posterior
paraspinal musculature and in the gluteal muscles. There is also
atrophy of the paraspinal musculature. The patient is quadriplegic.
The paraspinal soft tissues are otherwise normal.

The discs from T11-12 through L5-S1 are normal. There is low signal
throughout the bones of the lumbar spine which may represent a red
marrow reactivation. There is no evidence of spinal infection. No
spinal or foraminal stenosis or facet arthritis.

MRI of the sacrum:

There is abnormal edema and enhancement of the right ischial
tuberosity. There is a deep decubitus ulcer that extends to the
ischial tuberosity and there is a small abscess extending to enhance
cm laterally from the decubitus ulcer at the ischial tuberosity.
This pus collection does communicate with the decubitus ulcer.

There is devitalized soft tissue superficial to the left ischial
tuberosity at the site of another decubitus ulcer. There is also
devitalized soft tissue at the site of the soft tissue ulcer
overlying the sacrum. There is slight edema and enhancement of the
remnant of the fifth sacral segment and of the entire fourth thick
sacral segment but there is no bone destruction.

No evidence of epidural infection in the sacrum.

There is abnormal subcutaneous edema throughout the pelvis with
muscle atrophy.
IMPRESSION: 1. Multiple sacral and ischial tuberosity decubitus ulcers. Pus
extends from the depths of the right ischial tuberosity ulcer 2.5 cm
laterally.
2. Abnormal edema and enhancement of the right ischial tuberosity
with loss of the cortical margin, consistent with osteomyelitis.
3. Abnormal edema and enhancement of the fourth sacral segment and
of the remnant of the fifth sacral segment. This is immediately
adjacent to a sacral decubitus ulcer. This is not definitive for
osteomyelitis however.

## 2017-04-30 IMAGING — CR DG CHEST 1V PORT
1 series · 1 of 1 positions shown · non-contrast
Comparison: CT chest 01/11/2015.  Chest 01/10/2015.

CLINICAL DATA: Patient woke up this morning with chest pain and
shortness of breath.

EXAM:
PORTABLE CHEST - 1 VIEW

[AP]
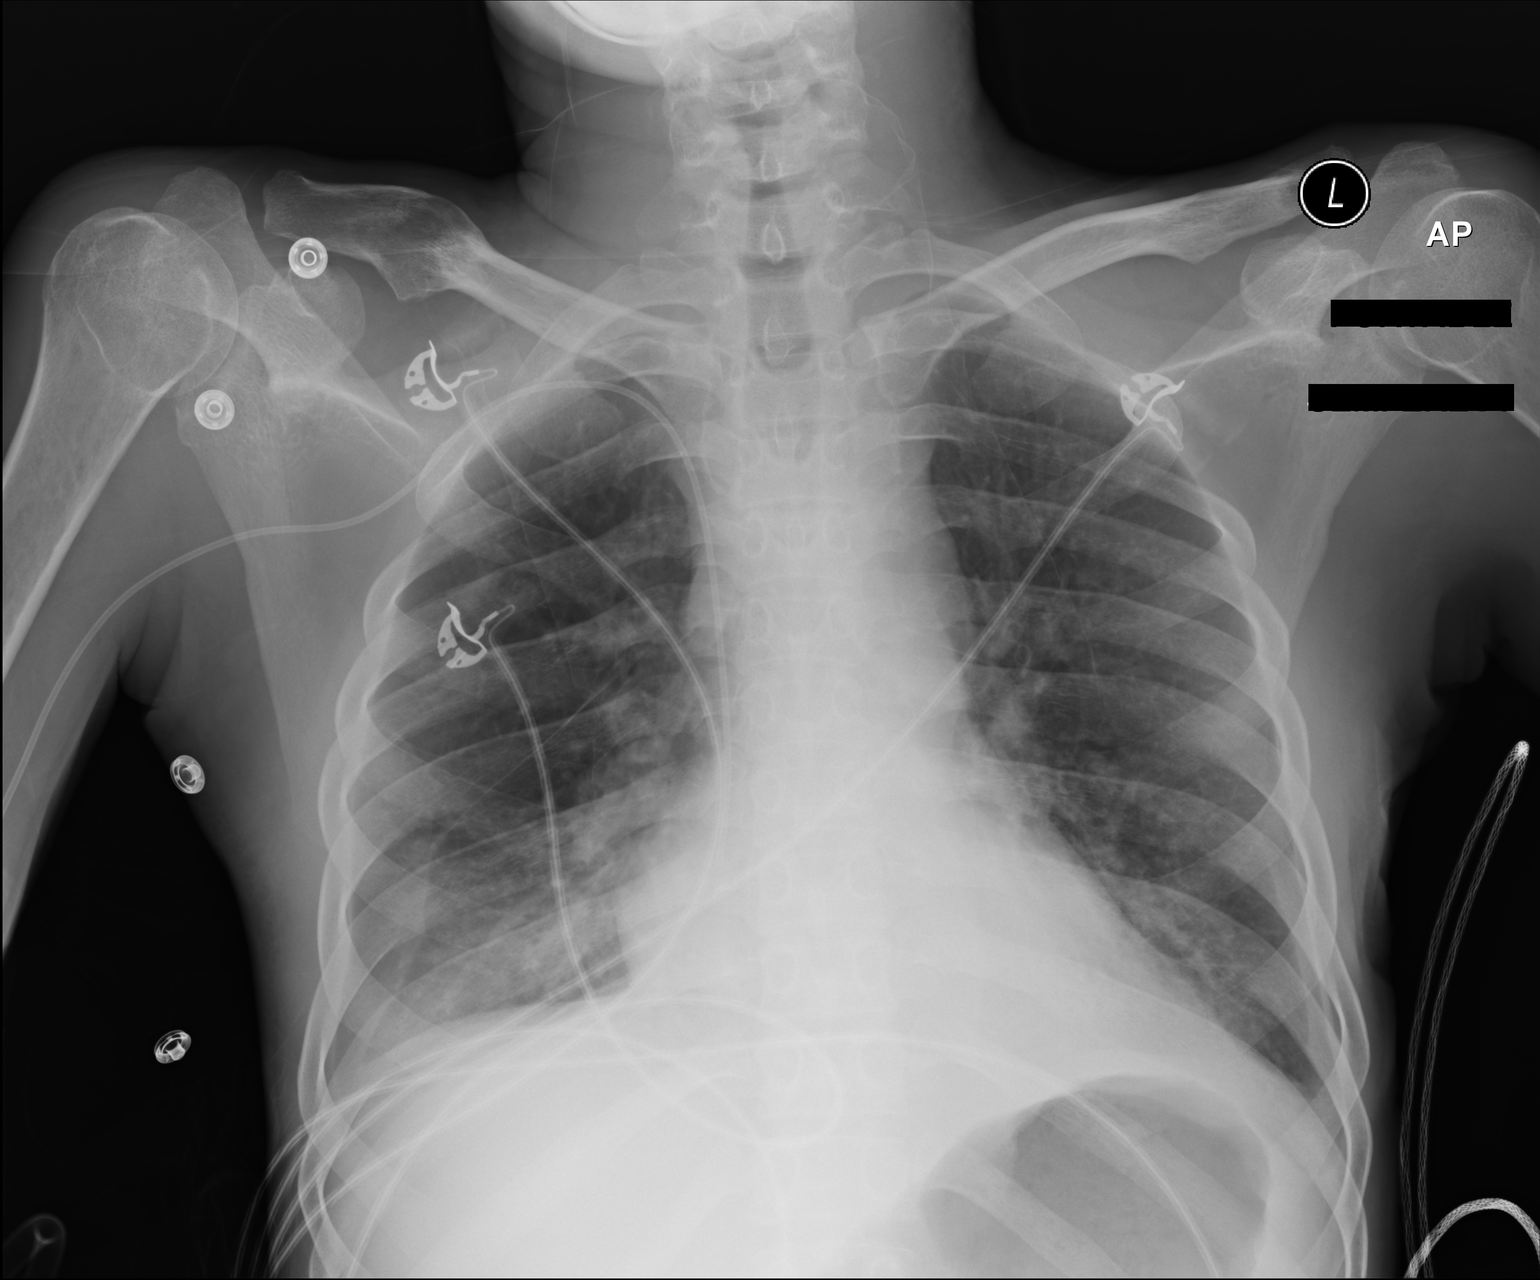

[1 of 1 positions shown; findings below may reference images not displayed]

FINDINGS: Borderline heart size with normal pulmonary vascularity. Patchy
bilateral basilar airspace disease is improving since previous
study. Small bilateral pleural effusions. Right PICC catheter tip is
over the cavoatrial junction. No pneumothorax. Old fracture
deformity of the right clavicle.
IMPRESSION: Basilar airspace infiltrates are improving since previous study.
Small bilateral pleural effusions.

## 2017-04-30 IMAGING — CT CT ANGIO CHEST
2 of 8 series · 19 of 46 positions shown · IV contrast (Omni 300)
Comparison: January 11, 2015

CLINICAL DATA: Acute respiratory failure, hypoxia, shortness of
breath, chest pain.

EXAM:
CT ANGIOGRAPHY CHEST WITH CONTRAST
TECHNIQUE: Multidetector CT imaging of the chest was performed using the
standard protocol during bolus administration of intravenous
contrast. Multiplanar CT image reconstructions and MIPs were
obtained to evaluate the vascular anatomy.
CONTRAST:  60mL OMNIPAQUE IOHEXOL 350 MG/ML SOLN

[Series 5: thins · axial · 0.58mm/px · z∈[+1426,+1676]mm · 16 of 277 slices shown]
[im 13/277  lung]
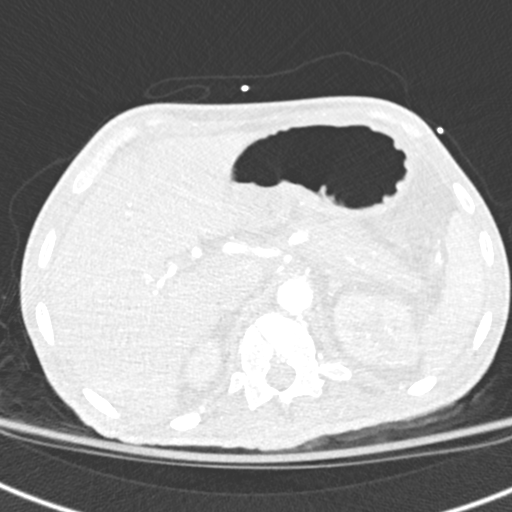
[im 26/277  soft-tissue]
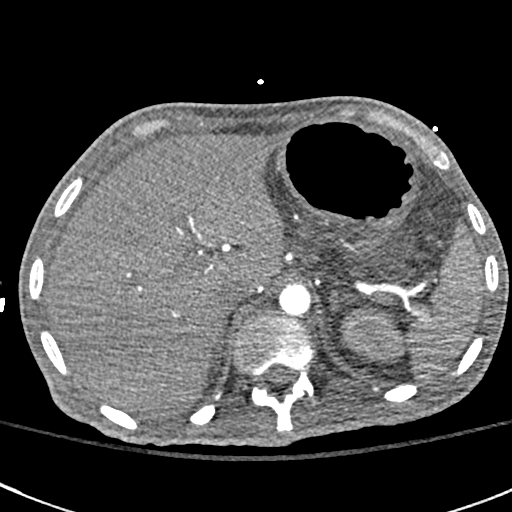
[im 51/277  lung]
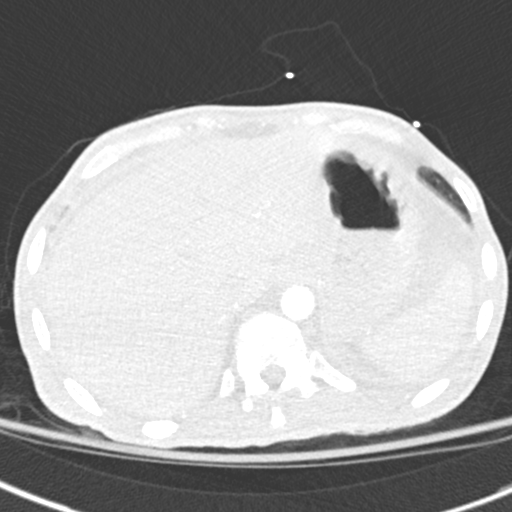
[im 63/277  soft-tissue]
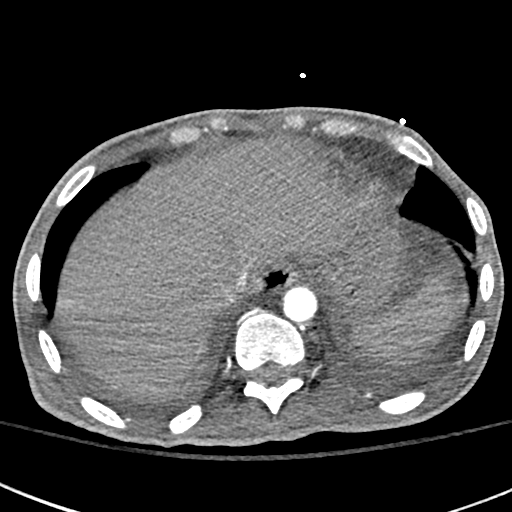
[im 76/277  lung]
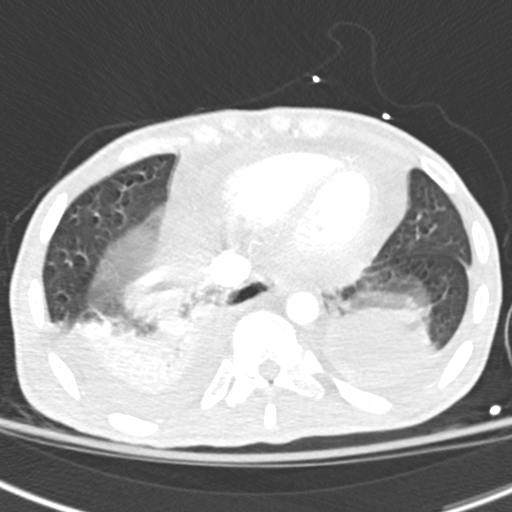
[im 101/277  soft-tissue]
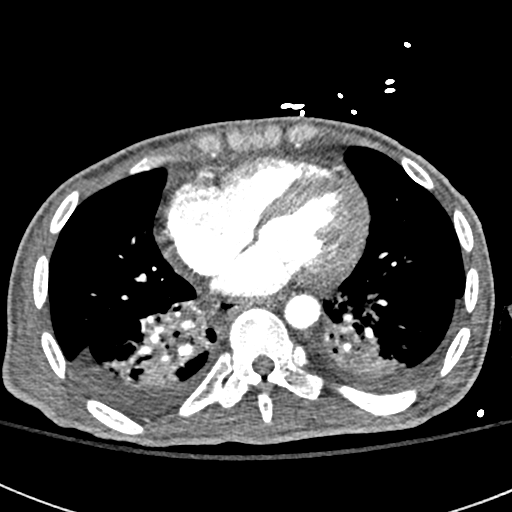
[im 113/277  lung]
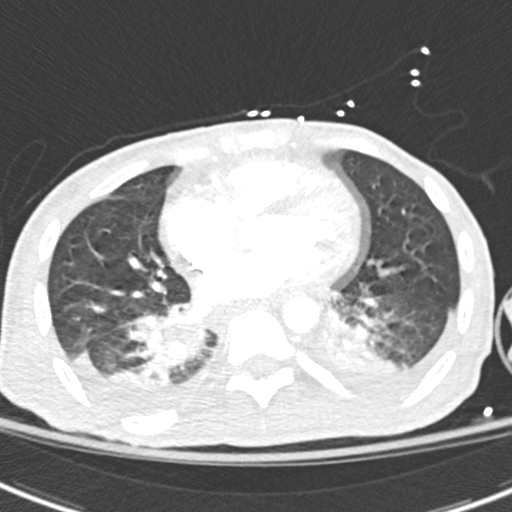
[im 126/277  soft-tissue]
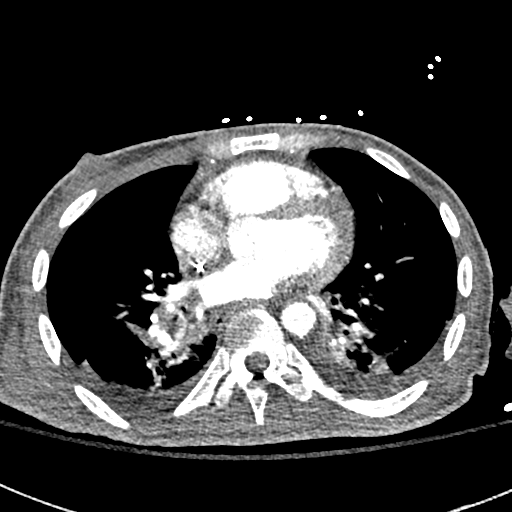
[im 151/277  lung]
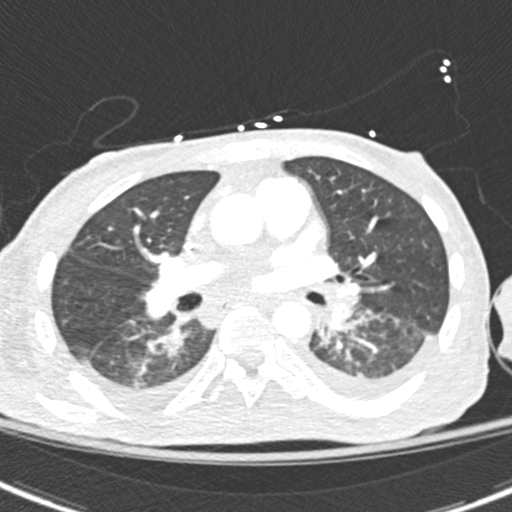
[im 164/277  soft-tissue]
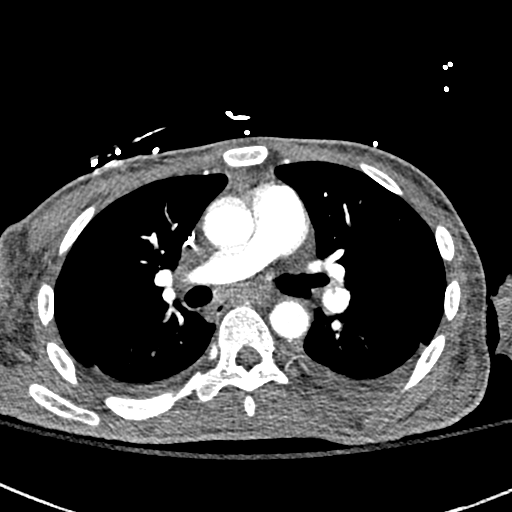
[im 176/277  lung]
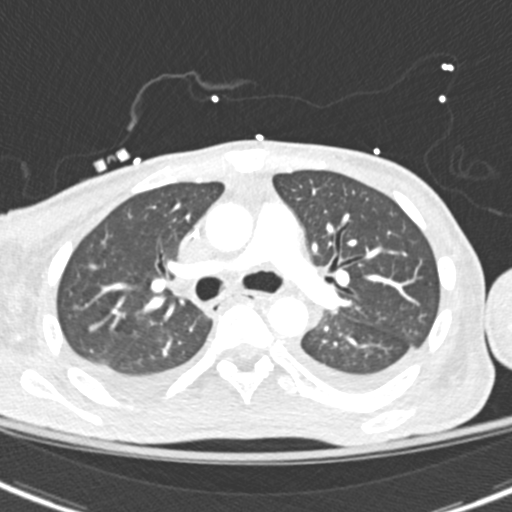
[im 201/277  soft-tissue]
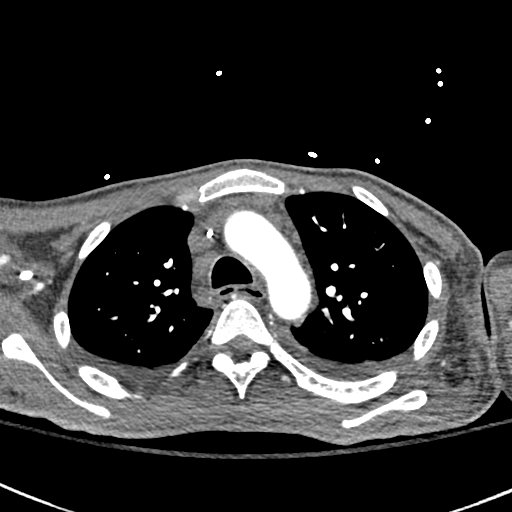
[im 214/277  lung]
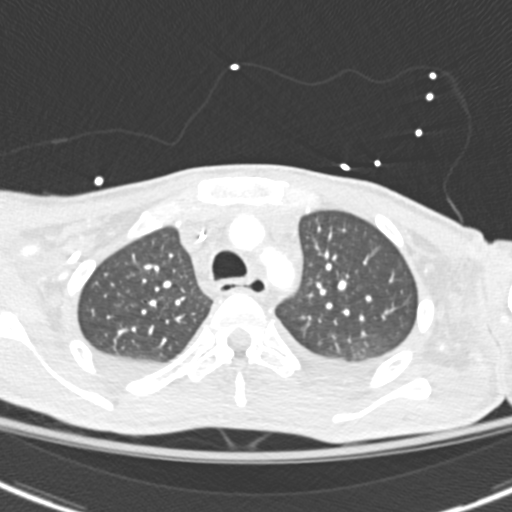
[im 226/277  soft-tissue]
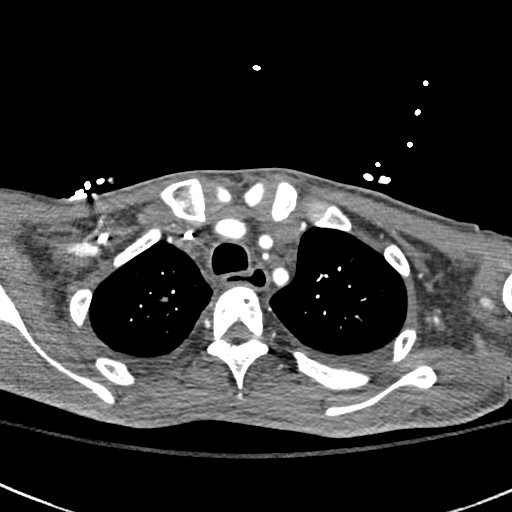
[im 251/277  lung]
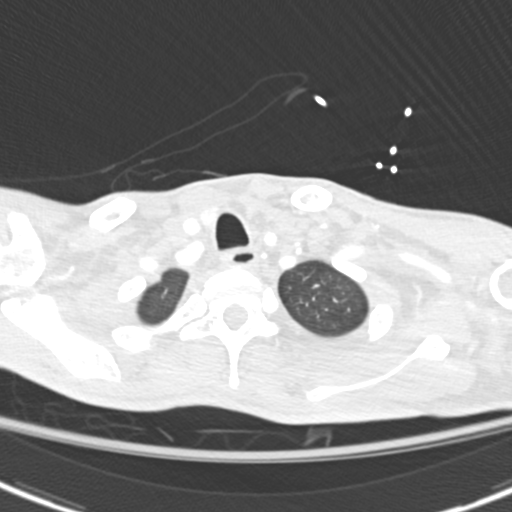
[im 264/277  soft-tissue]
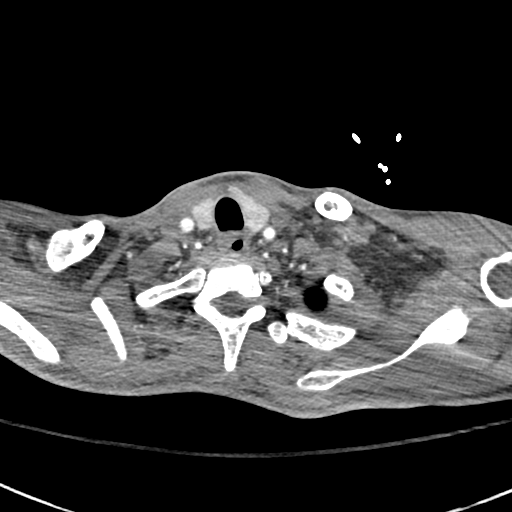

[Series 7: coronal mpr · coronal · 0.54mm/px · 3 of 97 slices shown]
[im 25/97  soft-tissue]
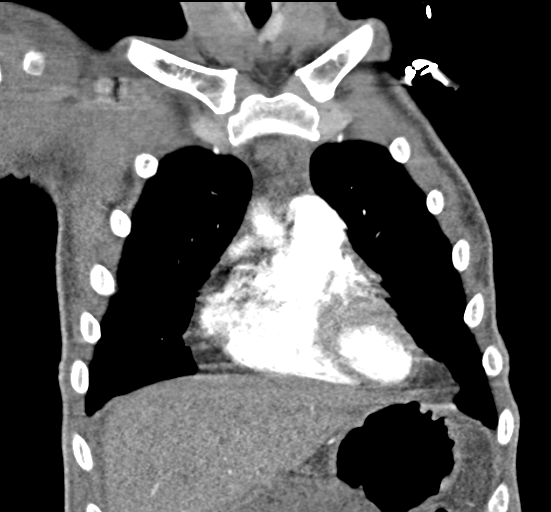
[im 49/97  soft-tissue]
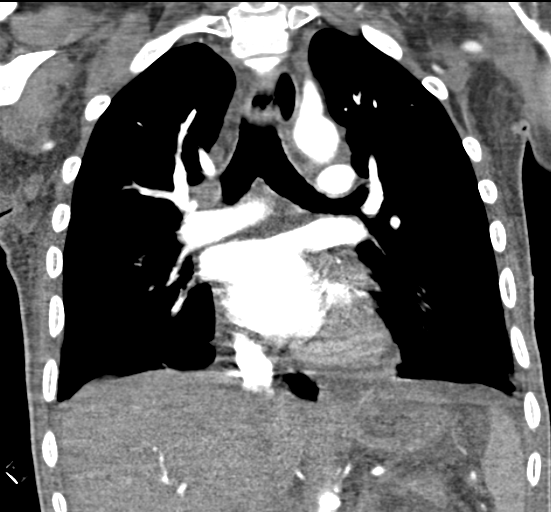
[im 73/97  soft-tissue]
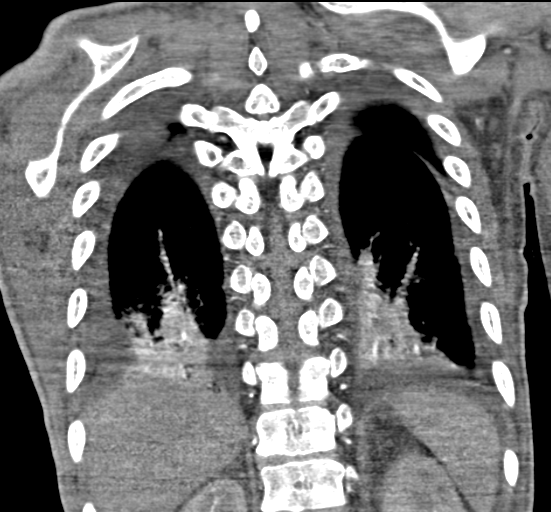

[19 of 46 positions shown; findings below may reference images not displayed]

FINDINGS: There is acute pulmonary embolus in segmental and subsegmental
pulmonary arteries of the left lower lobe. There is acute pulmonary
embolus in subsegmental pulmonary arteries of the right lower lobe.
There are extensive consolidation of the right lower lobe and the
medial aspect of the left lower lobe. There are small bilateral
pleural effusions. There are lymphadenopathy in the mediastinum. The
aorta is normal. There is small pericardial effusion. The visualized
upper abdominal structures are normal.

Review of the MIP images confirms the above findings.
IMPRESSION: Acute pulmonary embolus involving bilateral lower lobes with
extensive consolidation of the bilateral lower lobes as described.
The consolidations are most likely impart due to pulmonary
infarction but underlying pneumonia is not excluded.

Critical Value/emergent results were called by telephone at the time
of interpretation on 02/20/2015 at [DATE] to patient's nurse Liu
Jeff who verbally acknowledged these results.

## 2017-06-07 IMAGING — CR DG CHEST 1V PORT
1 series · 1 of 1 positions shown · non-contrast
Comparison: 02/20/2015

CLINICAL DATA: Fever, quadriplegia

EXAM:
PORTABLE CHEST - 1 VIEW

[AP]
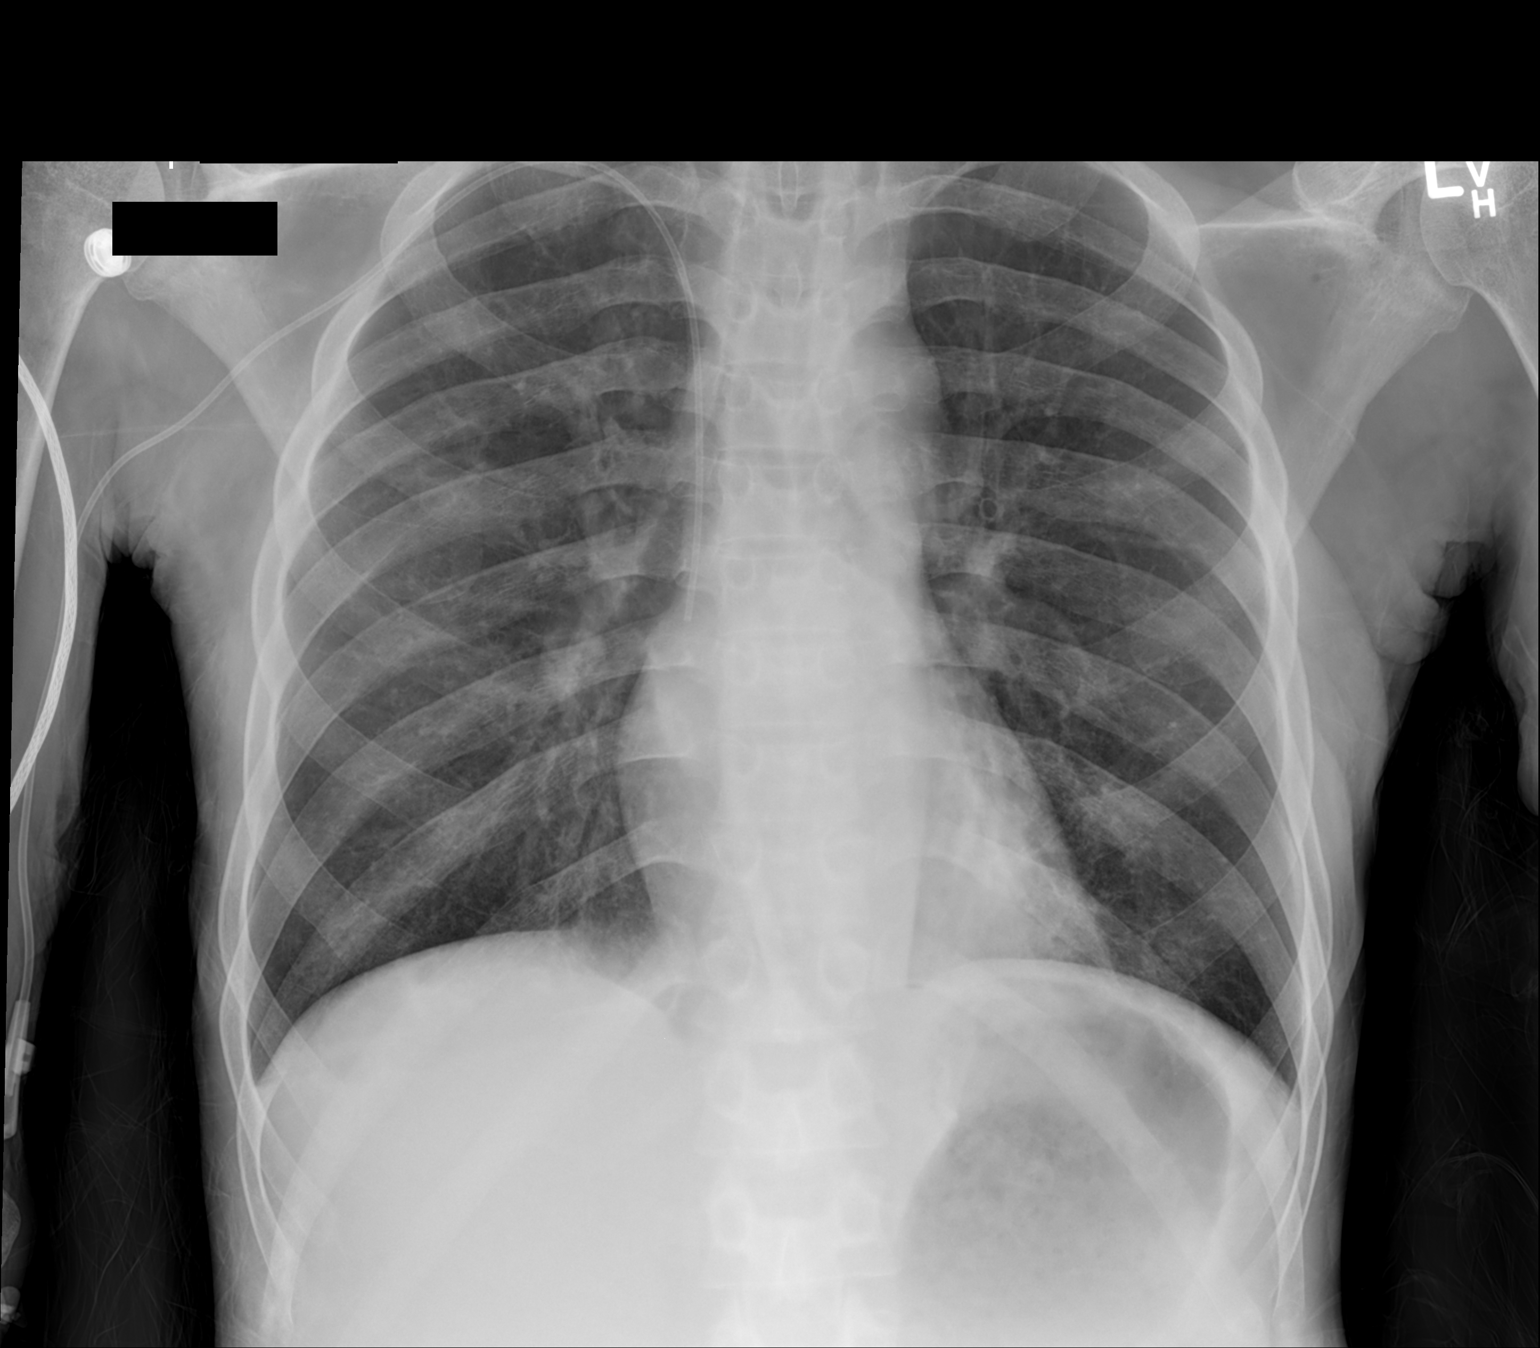

[1 of 1 positions shown; findings below may reference images not displayed]

FINDINGS: Cardiomediastinal silhouette is stable. Right arm PICC line with tip
in SVC right atrium junction. No pneumothorax. No acute infiltrate
or pulmonary edema. Old fracture of the right clavicle again noted.
IMPRESSION: No active disease.

## 2017-07-03 IMAGING — DX DG CHEST 1V PORT
1 series · 1 of 1 positions shown · non-contrast
Comparison: March 30, 2015

CLINICAL DATA: Hypotension

EXAM:
PORTABLE CHEST - 1 VIEW

[chest ap]
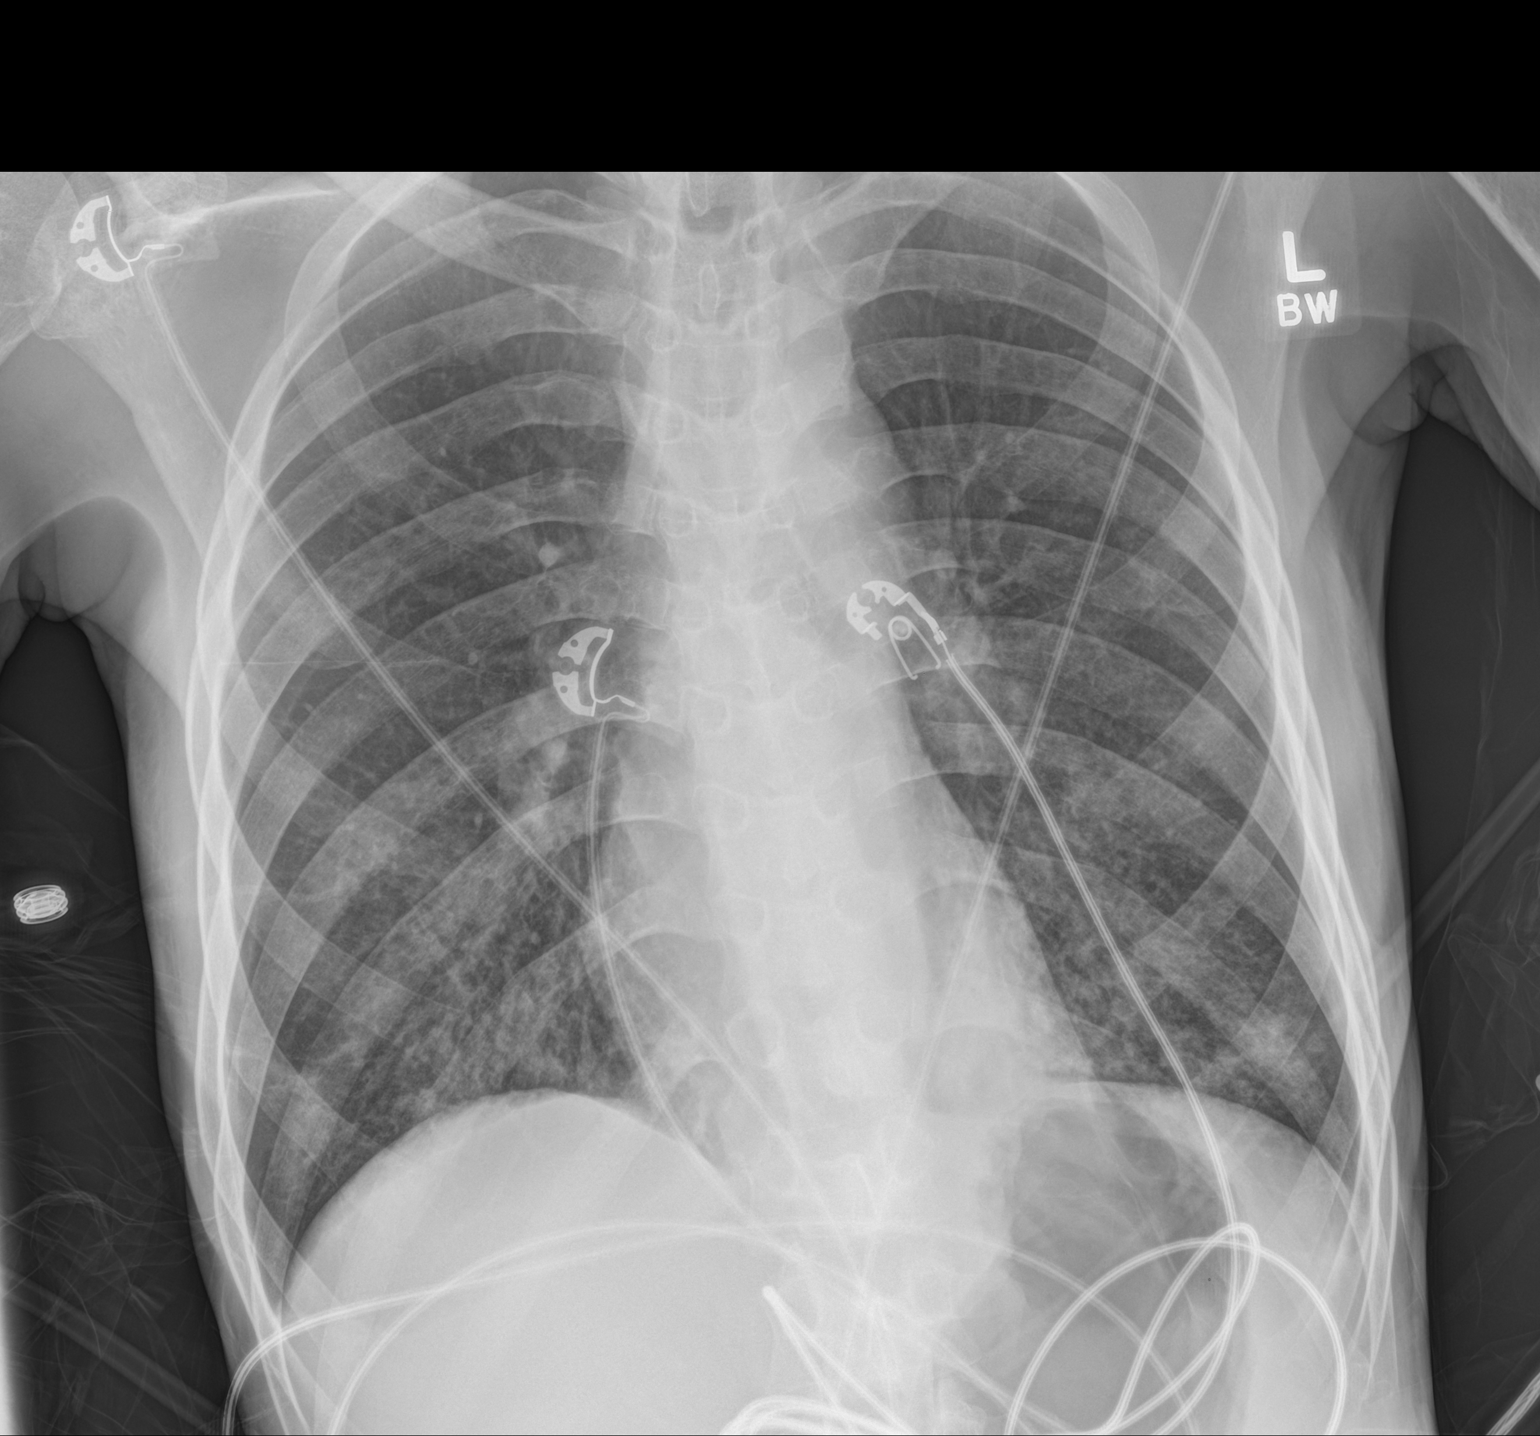

[1 of 1 positions shown; findings below may reference images not displayed]

FINDINGS: There is bibasilar interstitial infiltrate. Lungs elsewhere clear.
Heart size and pulmonary vascularity are normal. No adenopathy.
There is thoracolumbar levoscoliosis.
IMPRESSION: Bibasilar interstitial infiltrate. Suspect atypical pneumonia as
most likely etiology. Differential considerations must include
allergic type reaction or atypical noncardiogenic edema. Lungs
elsewhere clear. No change in cardiac silhouette.

## 2017-07-06 IMAGING — MR MR HEEL *L* W/O CM
4 of 5 series · 24 of 40 positions shown · non-contrast
Comparison: 01/11/2015

CLINICAL DATA: Heel pain.  Type 1 diabetes.

EXAM:
MR OF THE LEFT HEEL WITHOUT CONTRAST
TECHNIQUE: Multiplanar, multisequence MR imaging was performed. No intravenous
contrast was administered.

[Series 2: T1 · axial · 4.0mm · 0.29mm/px · z∈[-40,+108]mm · 9 of 31 slices shown (1 of 2)]
[im 1/31]
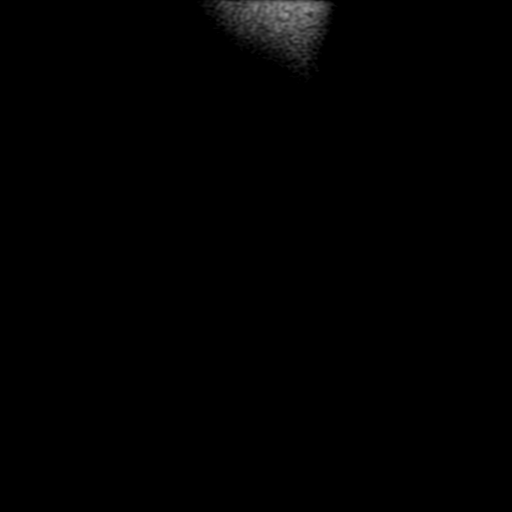
[im 4/31]
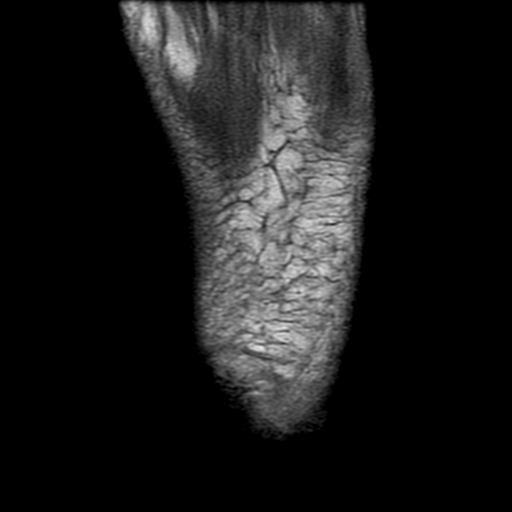
[im 8/31]
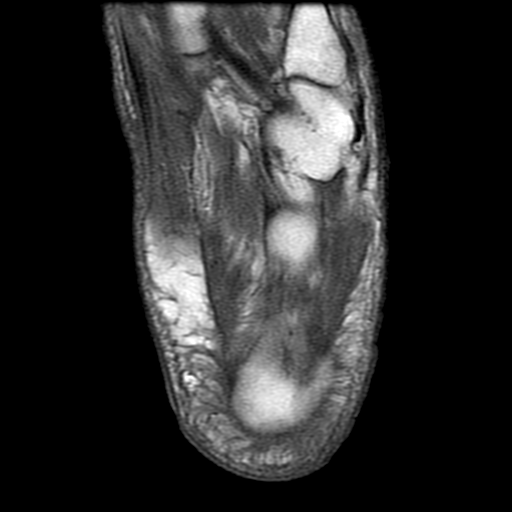
[im 12/31]
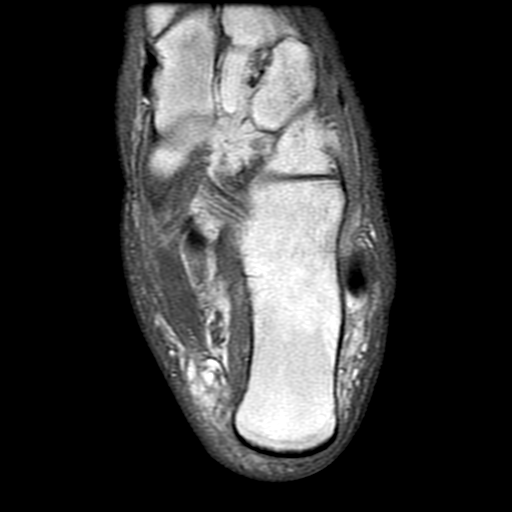
[im 16/31]
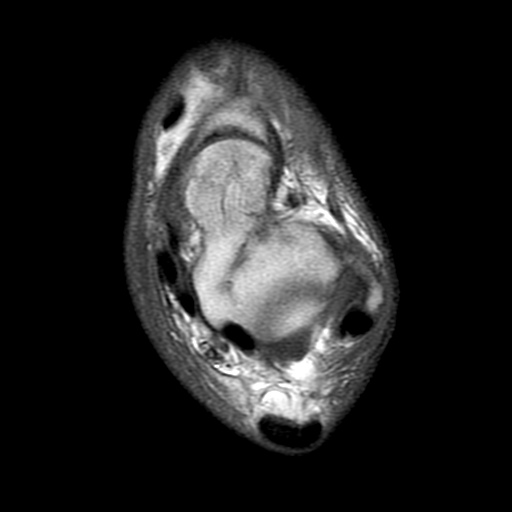
[im 19/31]
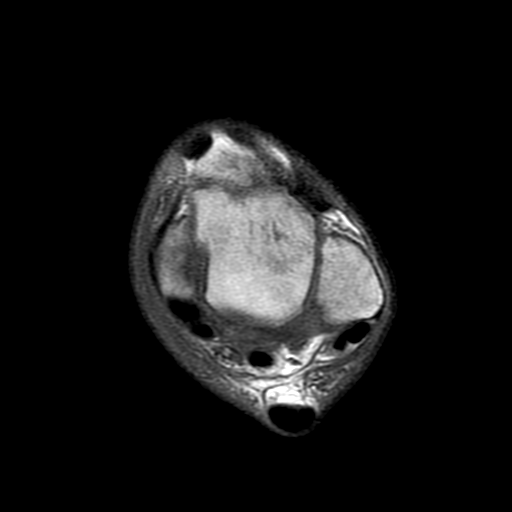
[im 23/31]
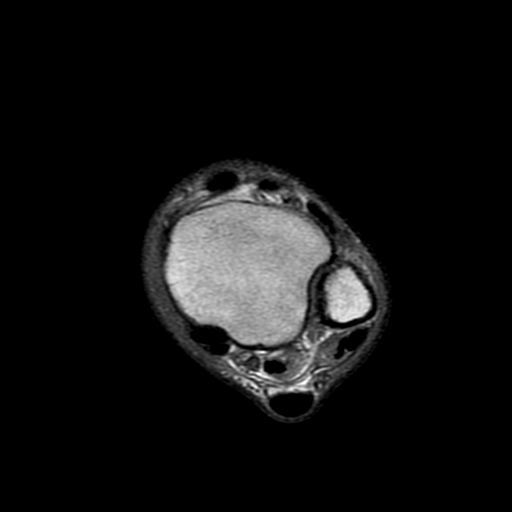
[im 27/31]
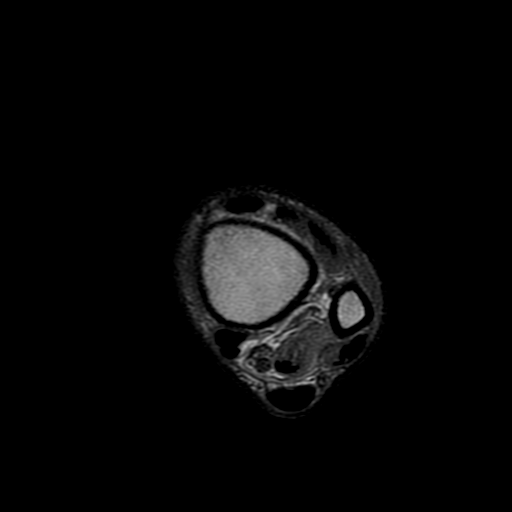
[im 31/31]
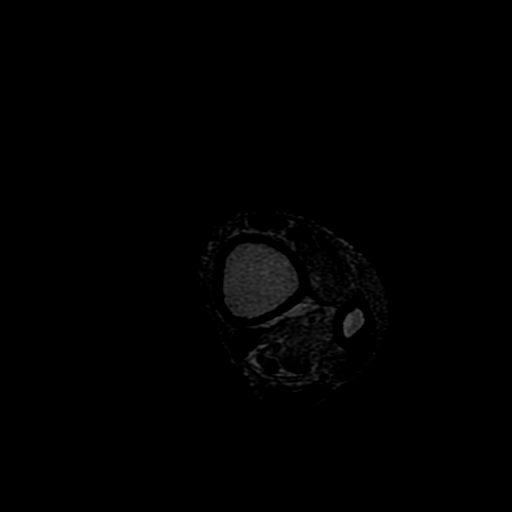

[Series 3: T2 · axial · 4.0mm · 0.29mm/px · z∈[-40,+89]mm · 4 of 31 slices shown]
[im 1/31]
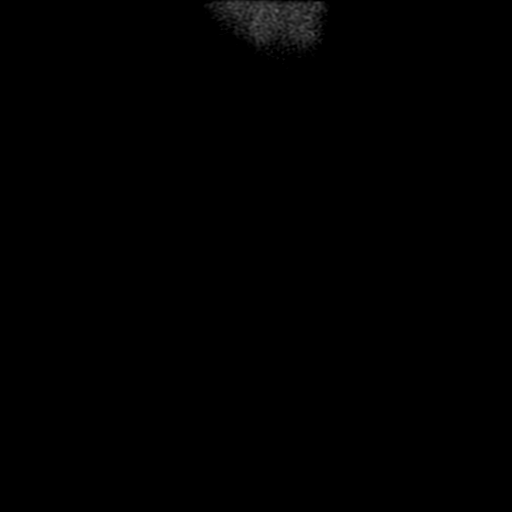
[im 4/31]
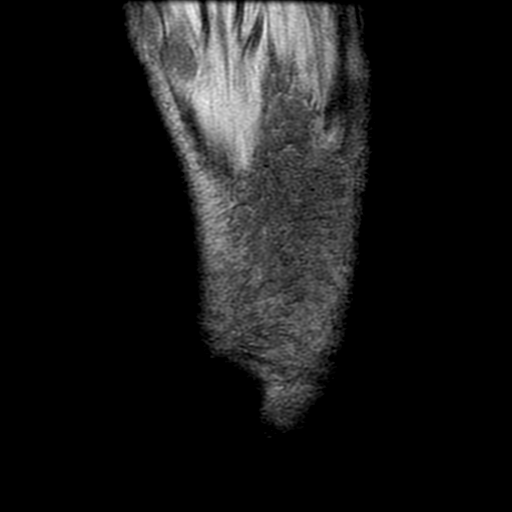
[im 16/31]
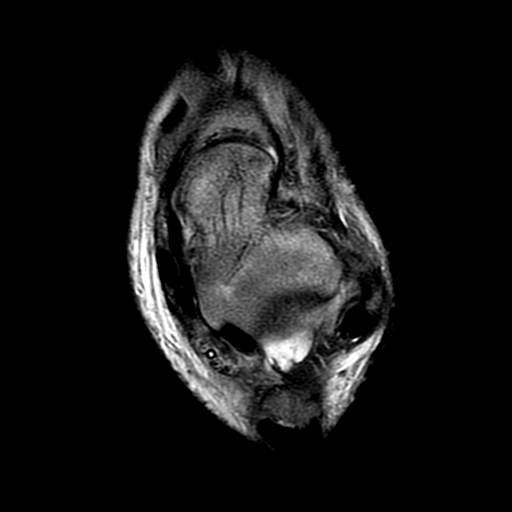
[im 27/31]
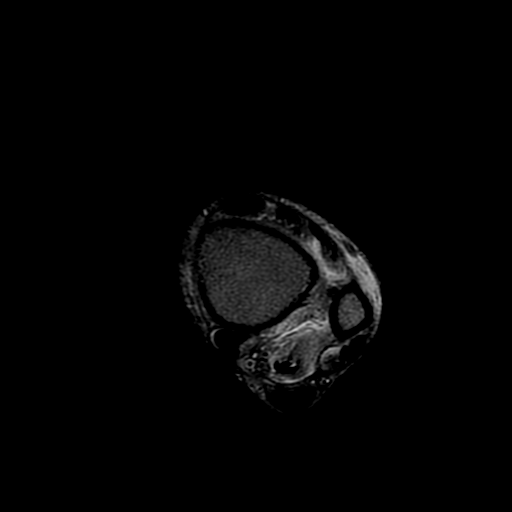

[Series 4: T1 · coronal · 4.0mm · 0.59mm/px · 8 of 29 slices shown (2 of 2)]
[im 1/29]
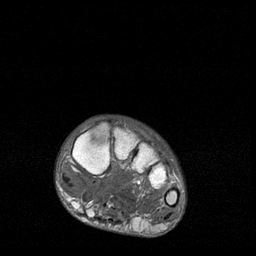
[im 5/29]
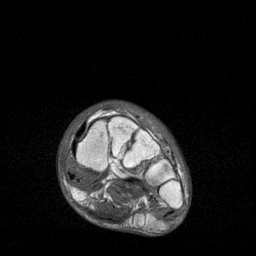
[im 9/29]
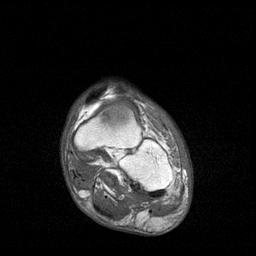
[im 13/29]
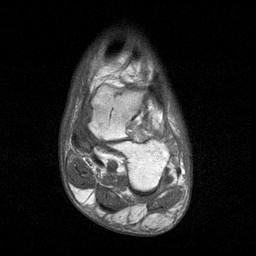
[im 17/29]
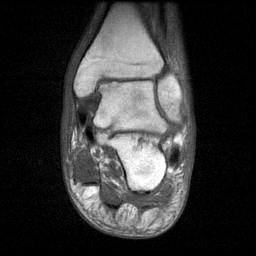
[im 21/29]
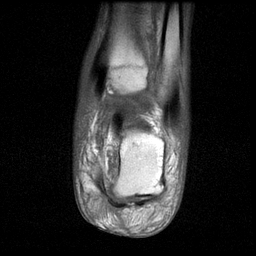
[im 25/29]
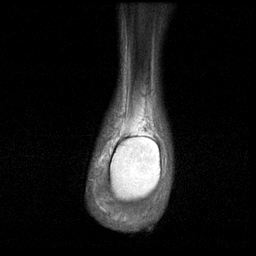
[im 29/29]
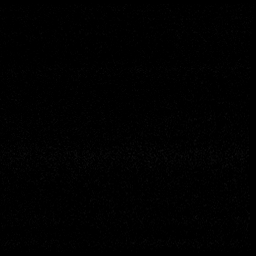

[Series 5: STIR · coronal · 4.0mm · 0.29mm/px · 3 of 29 slices shown]
[im 5/29]
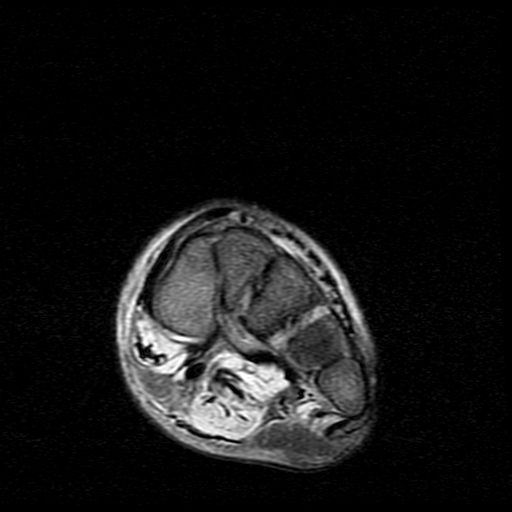
[im 17/29]
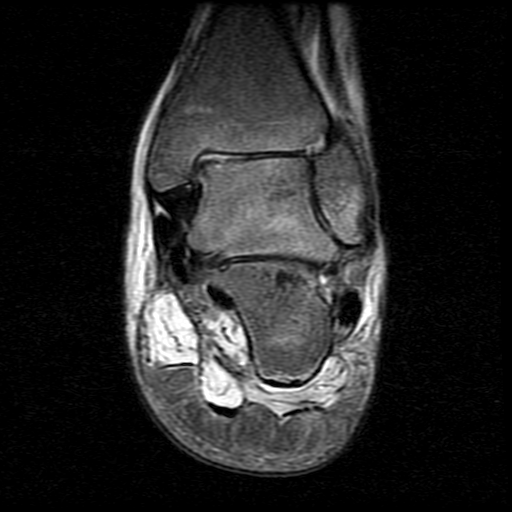
[im 25/29]
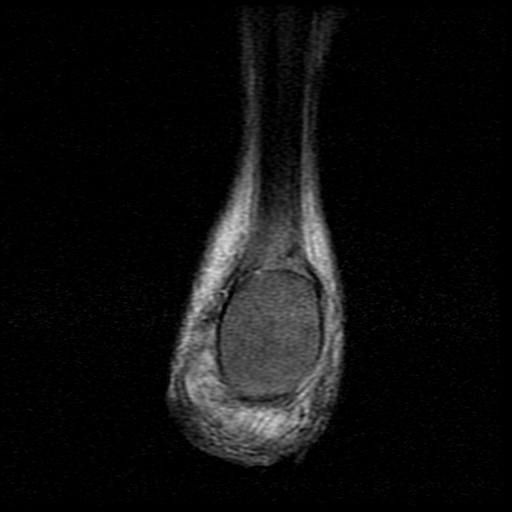

[24 of 40 positions shown; findings below may reference images not displayed]

FINDINGS: Peroneal: Peroneal longus tendon intact. Peroneal brevis intact.

Posteromedial: Posterior tibial tendon intact. Flexor hallucis
longus tendon intact. Flexor digitorum longus tendon intact.

Anterior: Tibialis anterior tendon intact. Extensor hallucis longus
tendon intact Extensor digitorum longus tendon intact.

Achilles: Intact.

Plantar Fascia: Intact.

LIGAMENTS

Medial: Deltoid ligament intact. Spring ligament intact.

Lateral: Anterior talofibular ligament intact. Posterior talofibular
ligament intact. Anterior and posterior tibiofibular ligaments
intact.

Cartilage: General chondral thinning of the tibiotalar joint.

Ankle Joint: No joint effusion. No dislocation.

Subtalar Joint: No joint effusion. Normal subtalar joints.

Sinus Tarsi: Normal.

Bones: No marrow signal abnormality. No bone destruction or
periosteal reaction.

Soft tissue: Small soft tissue ulcer over the posterior calcaneus
with adjacent soft tissue edema within the subcutaneous fat. No
drainable fluid collection or hematoma. Increased T2 signal
throughout the plantar musculature likely neurogenic.
IMPRESSION: 1. No osteomyelitis of the left ankle. Soft tissue ulcer and
surrounding edema overlying plantar aspect of the posterior
calcaneus without an abscess.

## 2017-07-06 IMAGING — MR MR PELVIS W/O CM
4 of 5 series · 19 of 48 positions shown · IV contrast (Y)
Comparison: MRIs dated 02/19/2015 and 01/08/2015

CLINICAL DATA: Sacral decubitus ulcer.  Quadriplegia.

EXAM:
MRI PELVIS WITHOUT CONTRAST
TECHNIQUE: Multiplanar multisequence MR imaging of the pelvis was performed. No
intravenous contrast was administered.

[Series 5: T1 · axial · 8.0mm · 0.68mm/px · z∈[-203,+96]mm · 9 of 31 slices shown (1 of 2)]
[im 1/31]
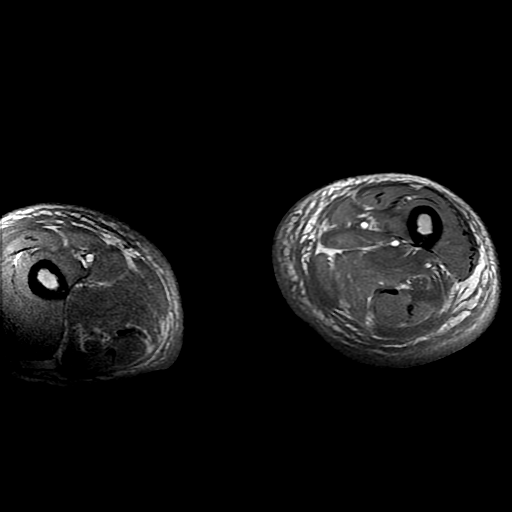
[im 4/31]
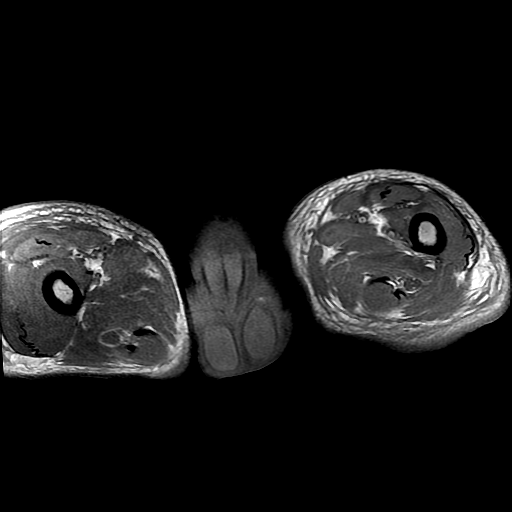
[im 8/31]
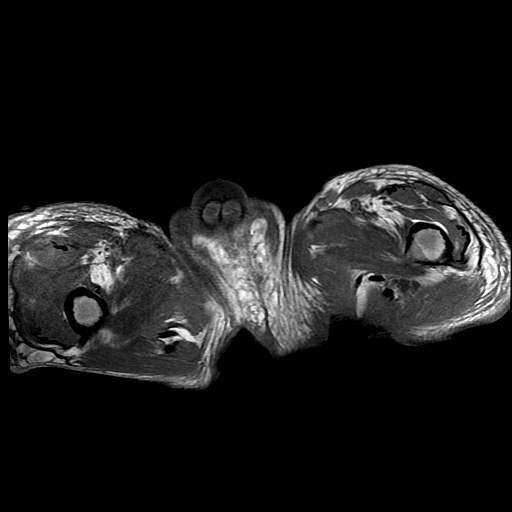
[im 12/31]
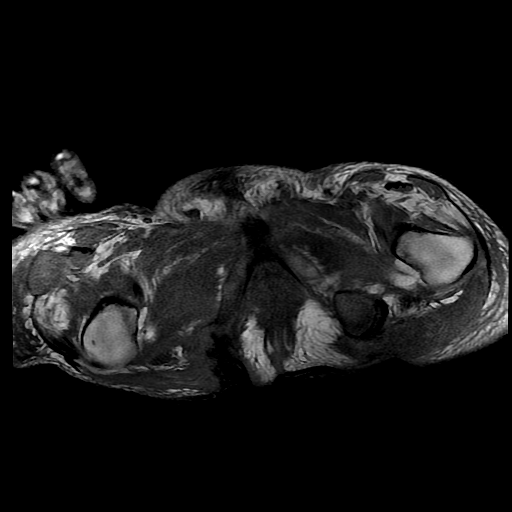
[im 16/31]
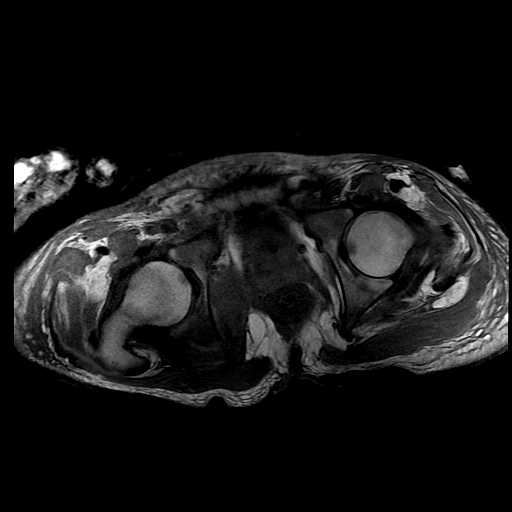
[im 19/31]
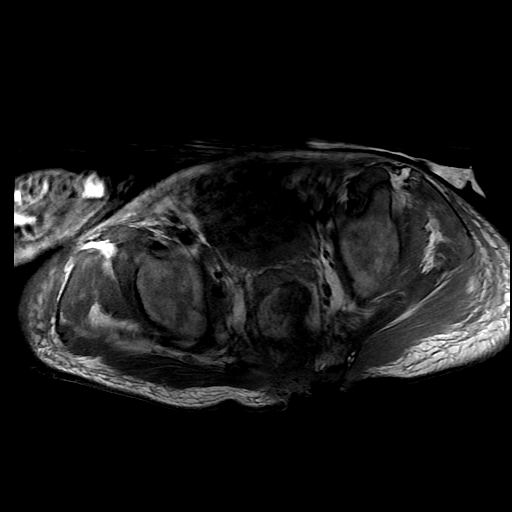
[im 23/31]
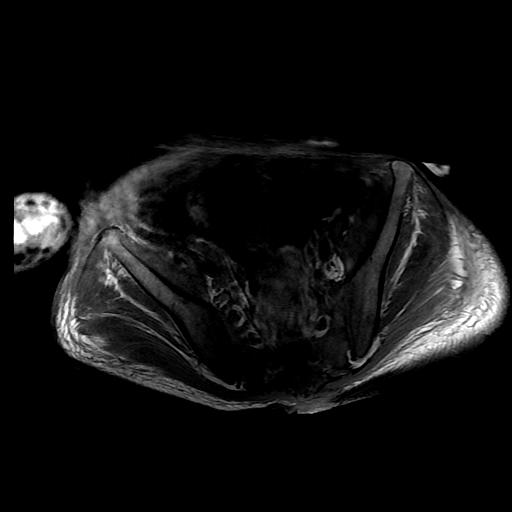
[im 27/31]
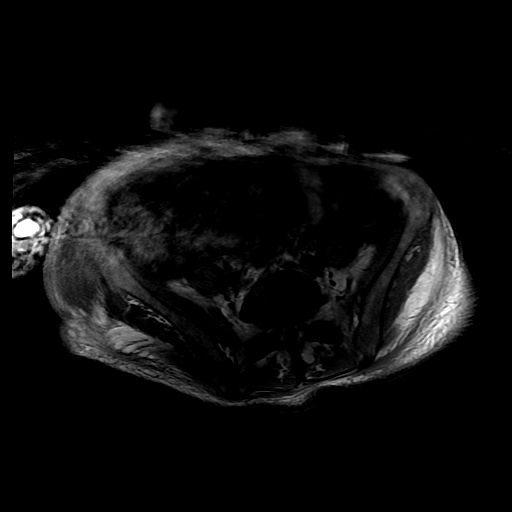
[im 31/31]
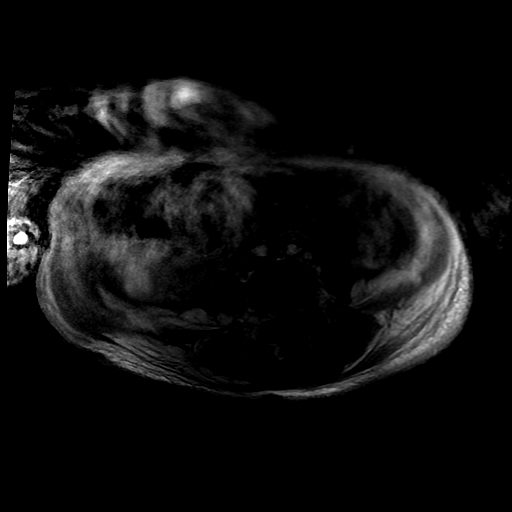

[Series 6: T2 fat-sat · axial · 8.0mm · 0.68mm/px · z∈[-203,+46]mm · 4 of 31 slices shown]
[im 1/31]
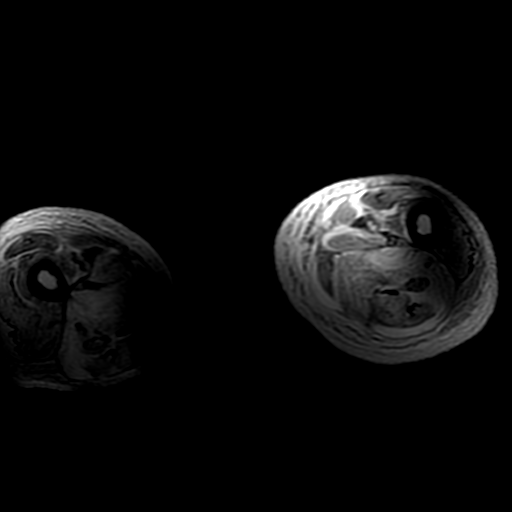
[im 5/31]
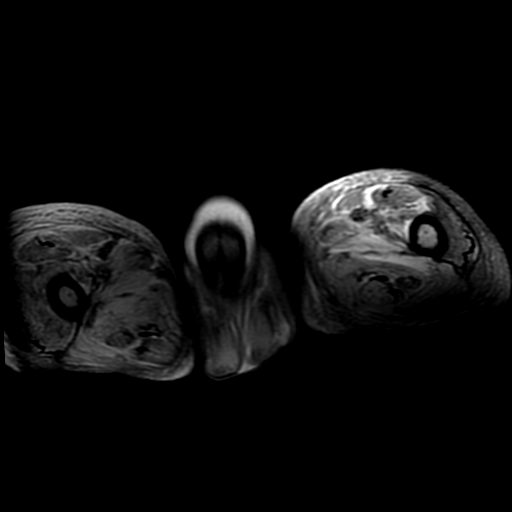
[im 18/31]
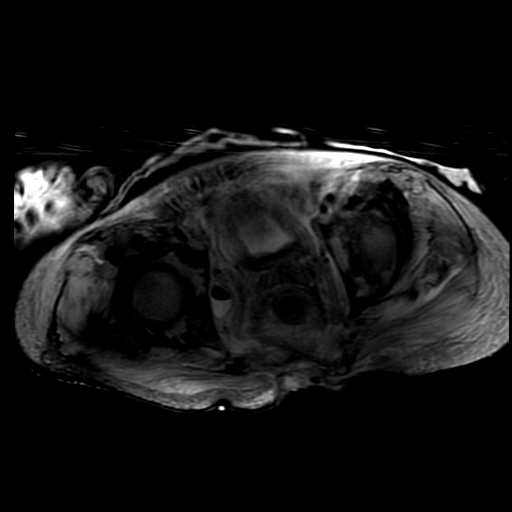
[im 26/31]
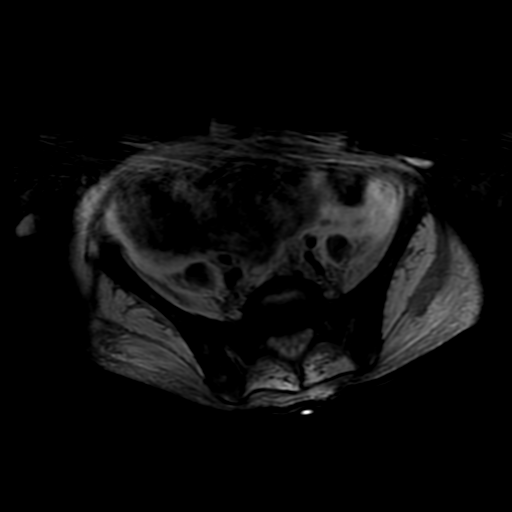

[Series 7: T1 · coronal · 5.0mm · 0.74mm/px · 3 of 30 slices shown (2 of 2)]
[im 5/30]
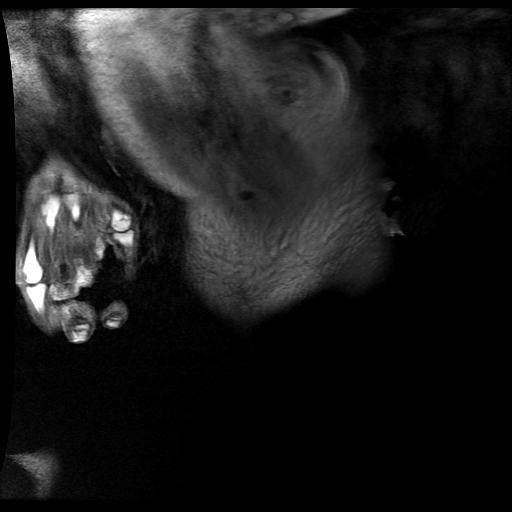
[im 17/30]
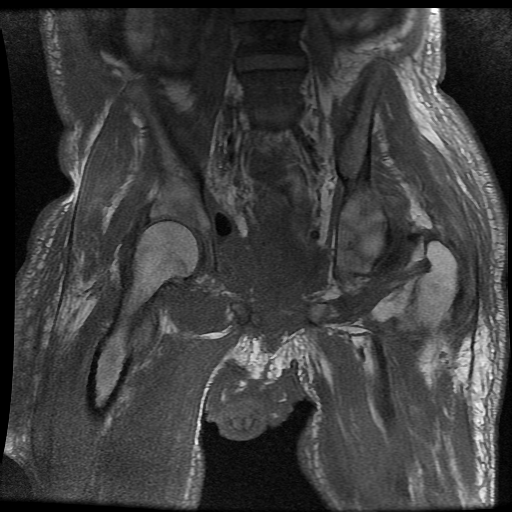
[im 25/30]
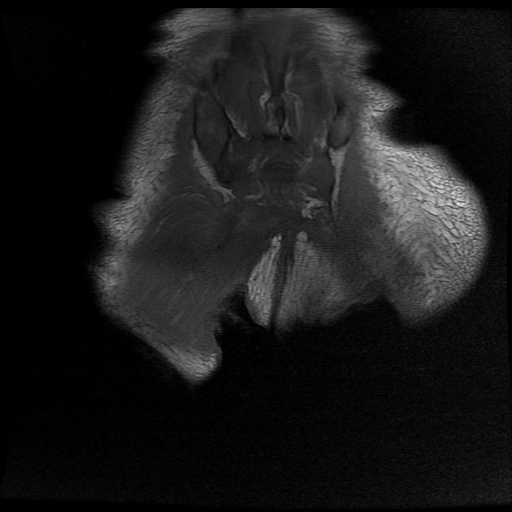

[Series 8: cor fse ir · coronal · 5.0mm · 0.74mm/px · 3 of 30 slices shown]
[im 5/30]
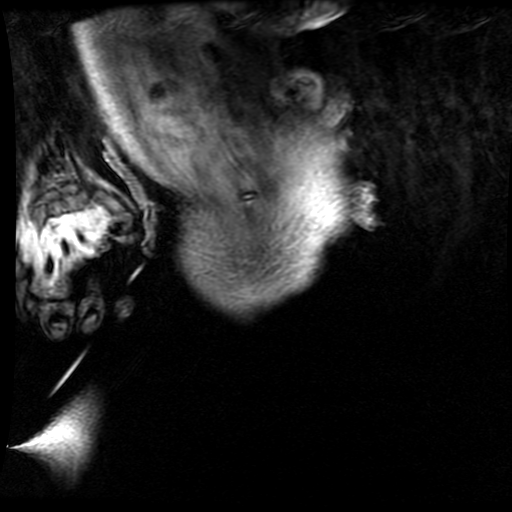
[im 17/30]
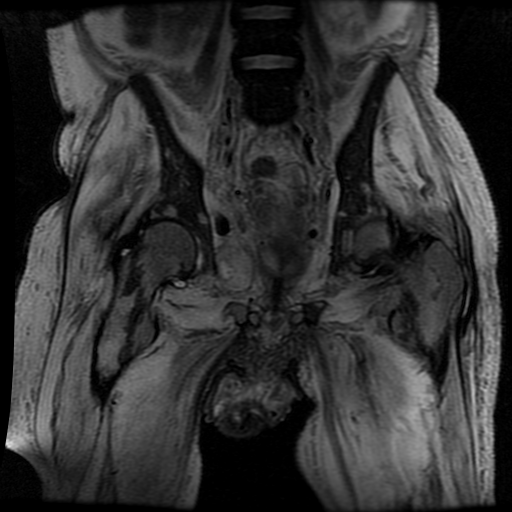
[im 25/30]
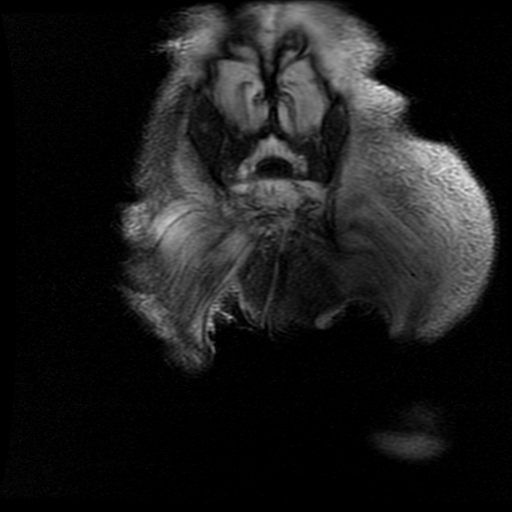

[19 of 48 positions shown; findings below may reference images not displayed]

FINDINGS: There is a abscess involving the deep aspect of the right internal
obturator muscle. The pus collection is approximately 2 cm in
diameter and contains air. The abscess is immediately medial to the
roof of the right acetabulum.

The deep decubitus ulcers overlying the ischial tuberosities extend
more deeply than on the prior study. There has a either been
debridement or destruction of a portion of the right inferior pubic
ramus and right ischial tuberosities since the prior study. There is
persistent abnormal signal in the remnants of the right inferior
pubic ramus extending to the right acetabulum consistent with
chronic osteomyelitis.

The left decubitus ulcer now extends directly to the surface of the
left ischial tuberosity but there is no bone destruction to suggest
osteomyelitis.

Since the prior study the central portion of the fourth sacral
segment has been resected or destroyed. No visible osteomyelitis of
the sacral remnants.

Suprapubic catheter noted in the bladder. Diffuse thickening of the
bladder wall.
IMPRESSION: 1. Right internal obturator abscess, appearing to extend from the
right ischial tuberosity decubitus ulcer.
2. Persistent abnormal signal from the right inferior pubic ramus
remnants extending into the posterior aspect of the right
acetabulum. This is most likely chronic osteomyelitis.

## 2017-07-10 IMAGING — CT CT PELVIS W/ CM
2 of 4 series · 17 of 46 positions shown, 19 images · IV contrast (OMNIPAQUE)
Comparison: MRI scan of April 28, 2015; CT scan of December 11, 2014.

CLINICAL DATA: Pelvic abscess.

EXAM:
CT PELVIS WITH CONTRAST
TECHNIQUE: Multidetector CT imaging of the pelvis was performed using the
standard protocol following the bolus administration of intravenous
contrast.
CONTRAST:  100mL OMNIPAQUE IOHEXOL 300 MG/ML  SOLN

[Series 2: pelvis st · axial · 0.69mm/px · z∈[-292,-92]mm · 14 of 46 slices shown, 16 images]
[im 3/46  soft-tissue]
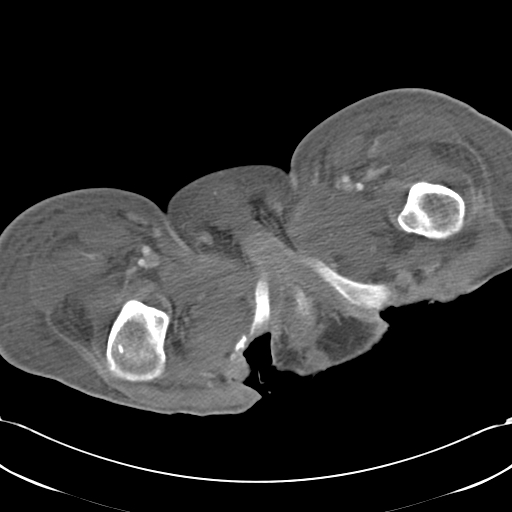
[im 3/46  bone]
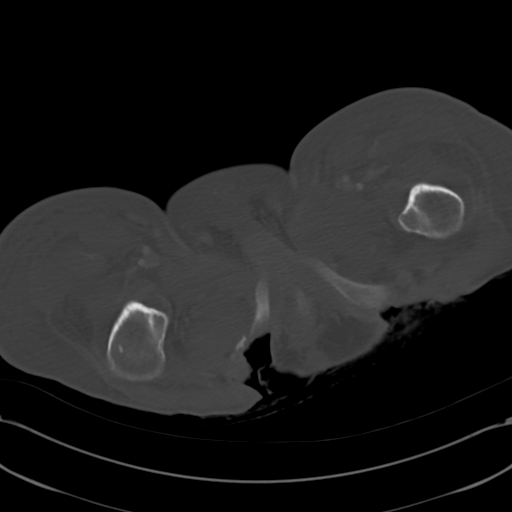
[im 5/46  soft-tissue]
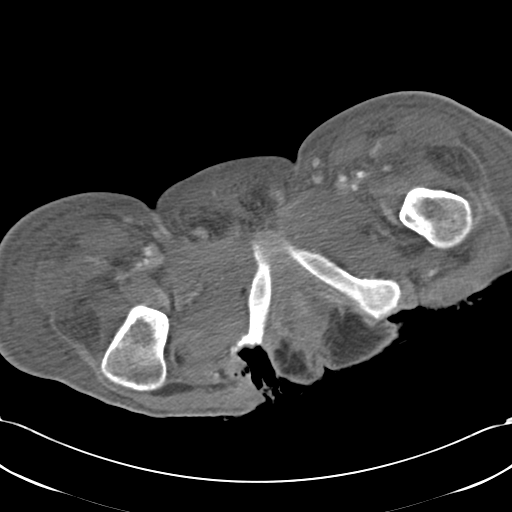
[im 10/46  soft-tissue]
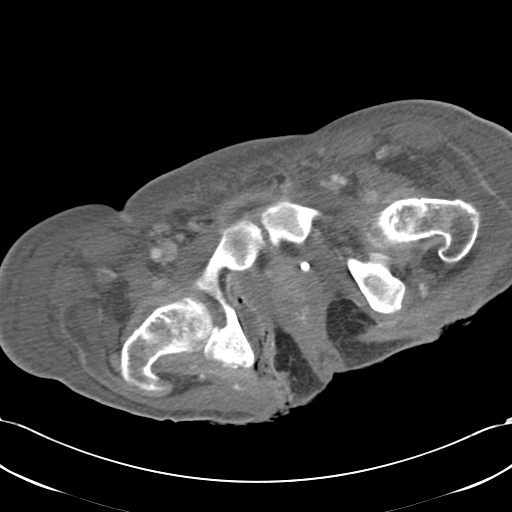
[im 12/46  soft-tissue]
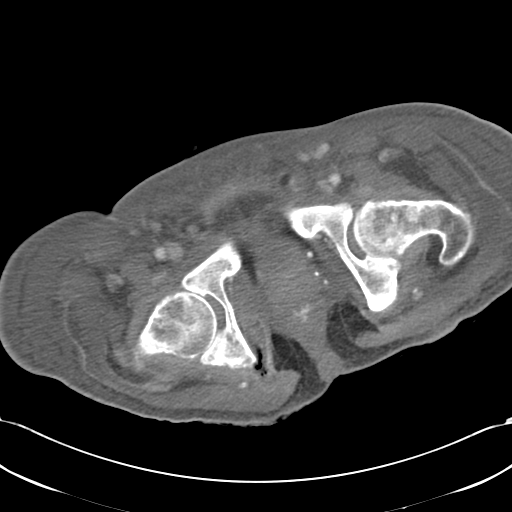
[im 15/46  soft-tissue]
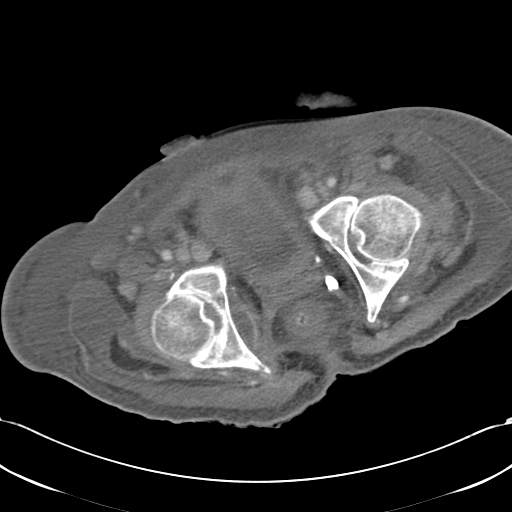
[im 19/46  soft-tissue]
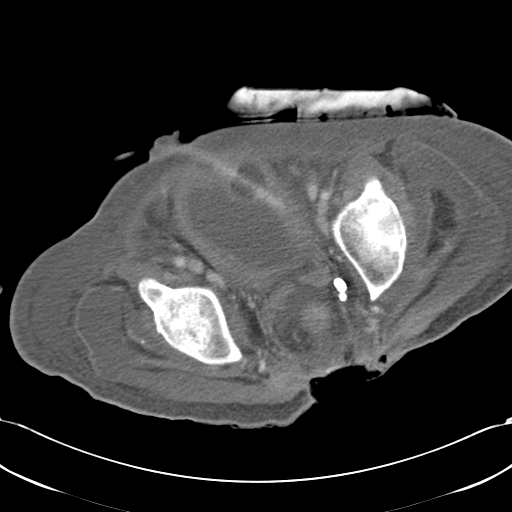
[im 22/46  soft-tissue]
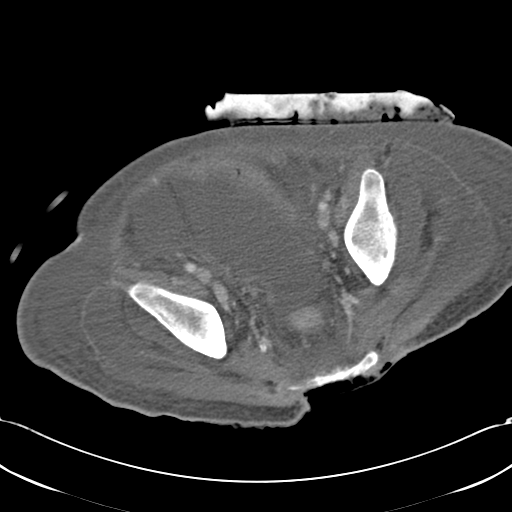
[im 24/46  soft-tissue]
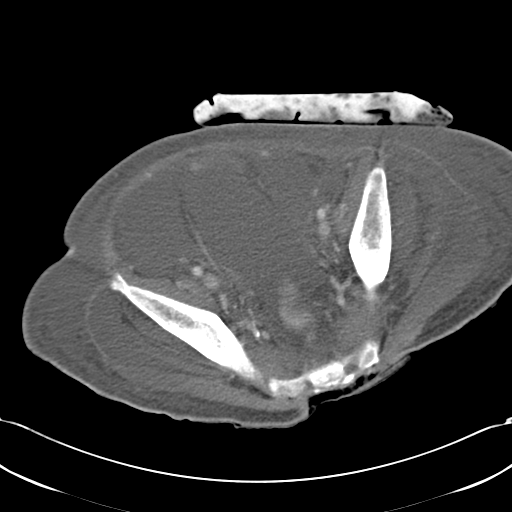
[im 27/46  soft-tissue]
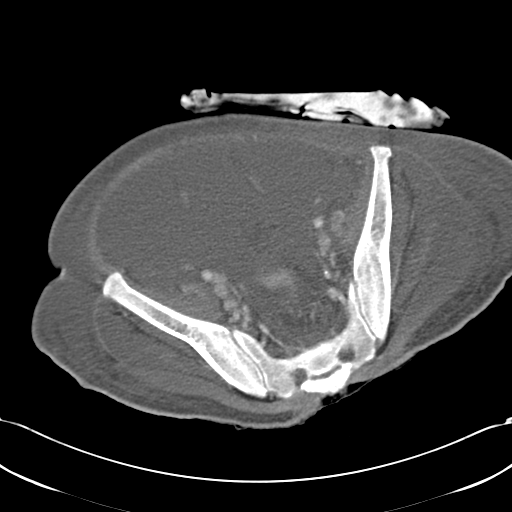
[im 27/46  bone]
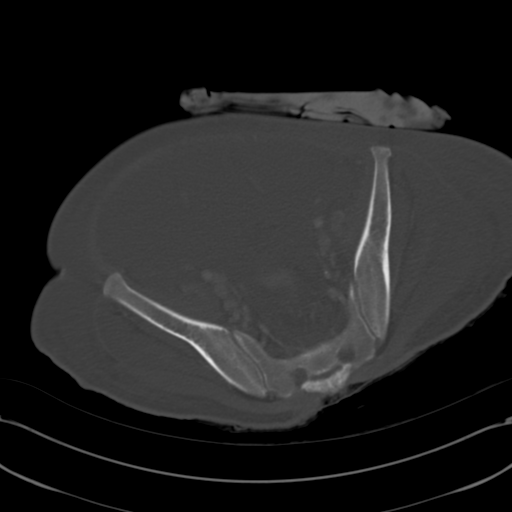
[im 31/46  soft-tissue]
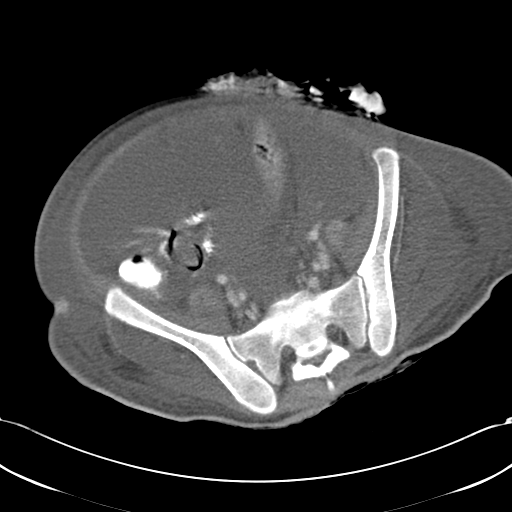
[im 34/46  soft-tissue]
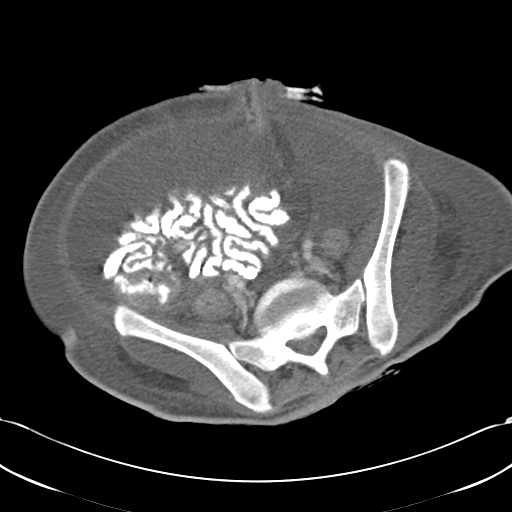
[im 36/46  soft-tissue]
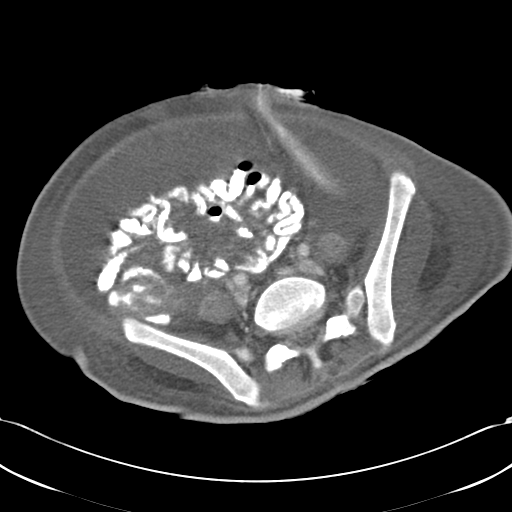
[im 41/46  soft-tissue]
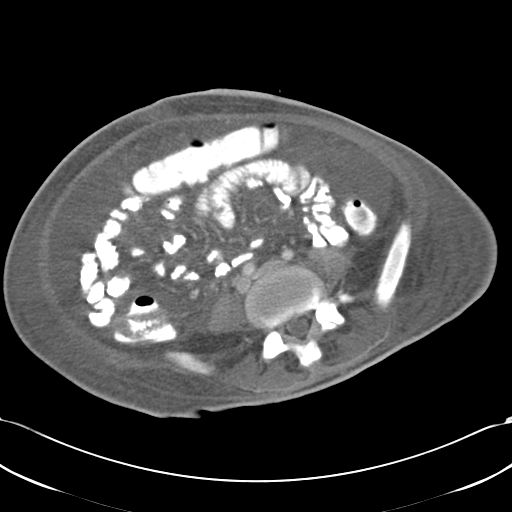
[im 43/46  soft-tissue]
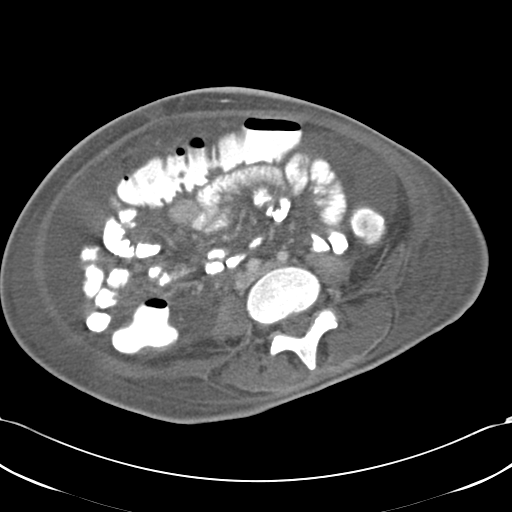

[Series 602: coronal images · coronal · 0.69mm/px · 3 of 82 slices shown]
[im 28/82  soft-tissue]
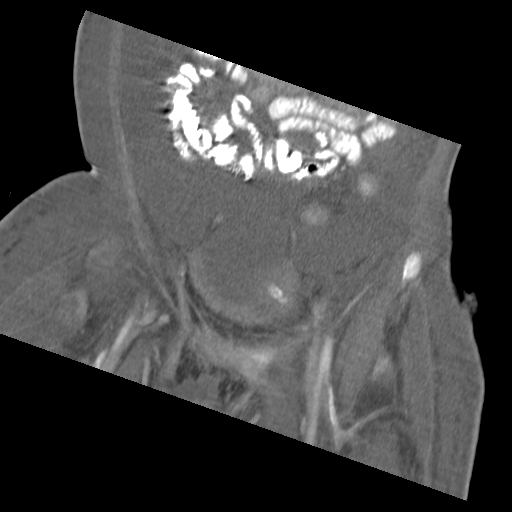
[im 37/82  soft-tissue]
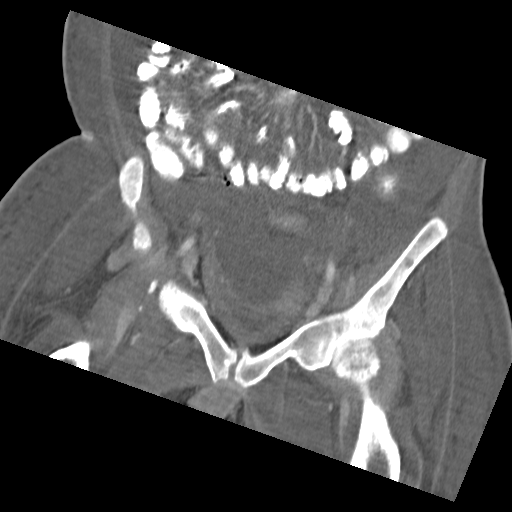
[im 46/82  soft-tissue]
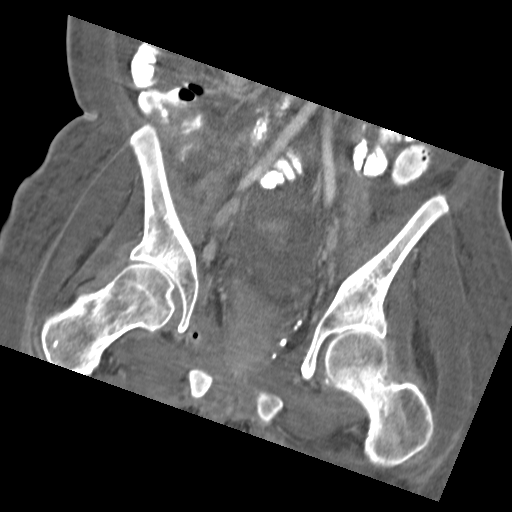

[17 of 46 positions shown; findings below may reference images not displayed]

FINDINGS: The patient appears to be status post surgical resection of the
distal sacrum and coccyx. Overlying sacral decubitus ulcer is noted.
Right buttocks ulceration is seen extending to the ischial
tuberosity, where some lucency is noted suggesting osteomyelitis.
Moderate ascites is noted. Ostomy is noted in left lower quadrant.
Suprapubic catheter is noted within the urinary bladder. There is no
evidence of bowel obstruction. The appendix appears normal. Moderate
soft tissue swelling or anasarca is noted in the visualized soft
tissues. Two fluid collections are noted along the right side of the
pelvic wall just medial to right acetabulum ; 1 measures 27 x 13 mm
superiorly. The other measures 36 x 12 mm more inferiorly. These
appear to communicate posteriorly.
IMPRESSION: Moderate ascites.

Moderate anasarca is noted.

Status post surgical resection of distal sacrum and coccyx with
overlying sacral decubitus ulcer.

Large deep ulceration is seen involving right buttocks area which
extends to the cortex of the right ischial tuberosity, where some
lucency is noted suggesting osteomyelitis. Two abscesses are noted
in this area along the medial portion of the right acetabulum which
appear to communicate posteriorly.

## 2017-07-15 IMAGING — CR DG CHEST 1V PORT
1 series · 1 of 1 positions shown · non-contrast
Comparison: Earlier this day at 6456 hour

CLINICAL DATA: Dyspnea.

EXAM:
PORTABLE CHEST - 1 VIEW

[AP]
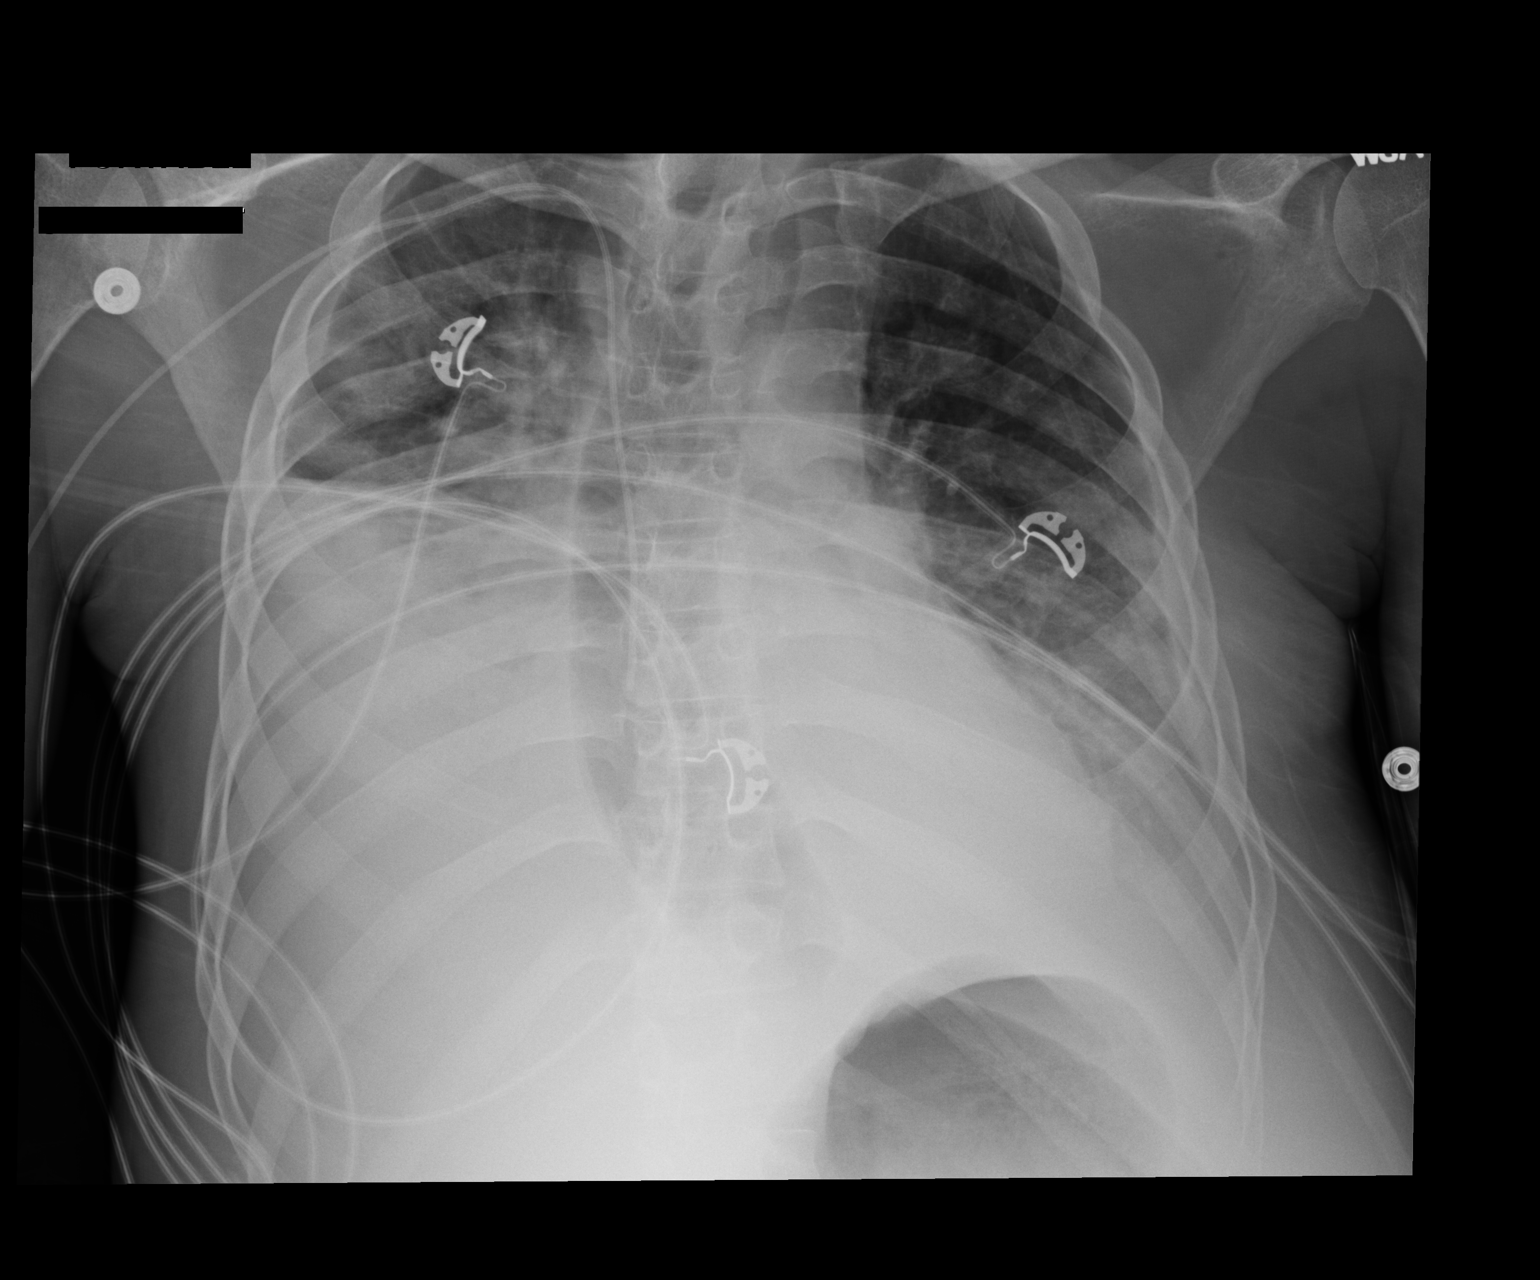

[1 of 1 positions shown; findings below may reference images not displayed]

FINDINGS: Tip of the right upper extremity PICC at the atrial caval junction.
Increased right pleural effusion from prior exam. Unchanged left
pleural effusion. Cardiomediastinal contours are obscured. Presumed
gaseous distention of esophagus in the mid thorax. No pneumothorax.
IMPRESSION: 1. Increased large right pleural effusion. Unchanged large left
pleural effusion.
2. Presumed gaseous distention of esophagus in the mid thorax.

## 2017-07-15 IMAGING — CR DG CHEST 1V PORT
1 series · 1 of 1 positions shown · non-contrast
Comparison: Plain film 04/25/2015, 03/30/2015, CT 02/20/2015

CLINICAL DATA: 28-year-old male with a history of spinal color
infarction and sacral decubitus ulcer. Respiratory distress.

EXAM:
PORTABLE CHEST - 1 VIEW

[AP]
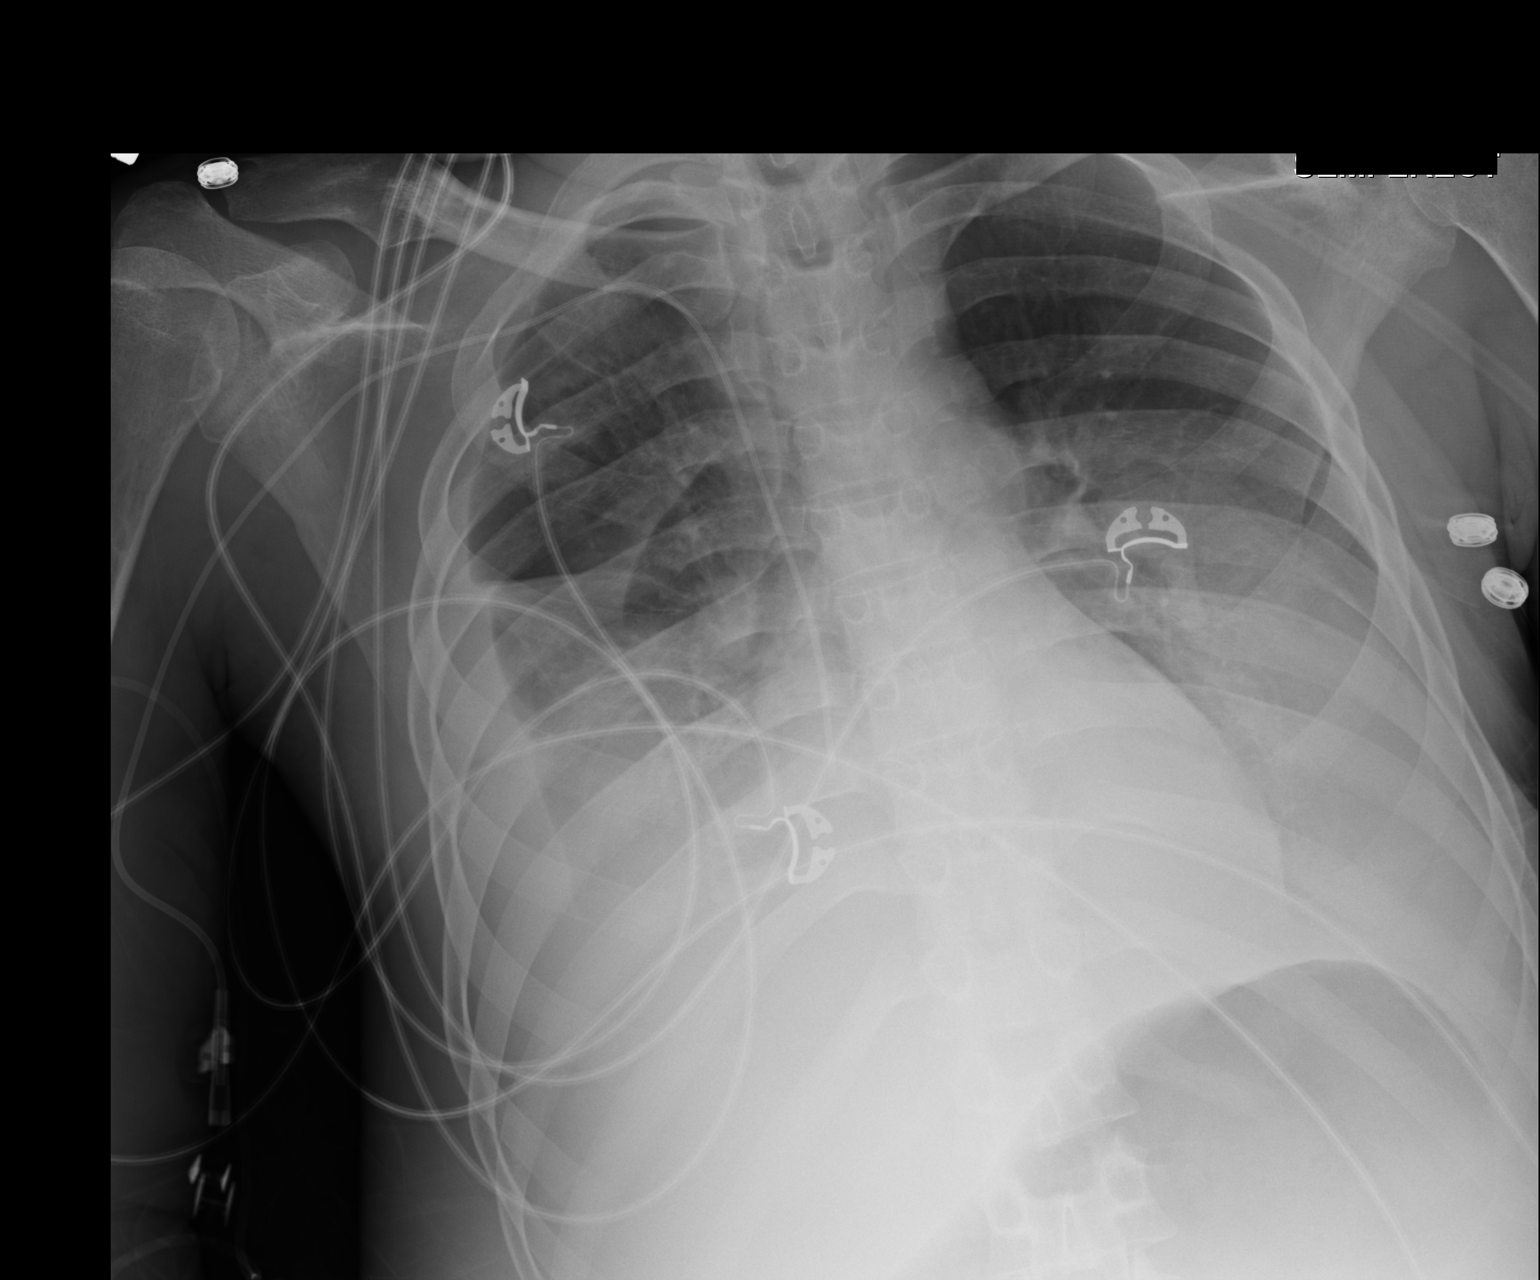

[1 of 1 positions shown; findings below may reference images not displayed]

FINDINGS: Cardiomediastinal silhouette likely unchanged, partially obscured by
overlying lung/ pleural disease.

Interval development of dense bilateral lung opacities at the bases.
No pneumothorax.

Interval placement of right upper extremity PICC, with the tip
appearing to terminate at the superior vena cava.
IMPRESSION: Interval development of large bilateral pleural effusions with
associated atelectasis and/ or consolidation.

Interval placement of right upper extremity PICC, appearing to
terminate at the superior vena cava.

## 2017-07-16 IMAGING — CR DG CHEST 1V PORT
1 series · 1 of 1 positions shown · non-contrast
Comparison: 05/08/2015

CLINICAL DATA: Acute respiratory failure and hypoxia.  Intubation.

EXAM:
PORTABLE CHEST - 1 VIEW

[AP]
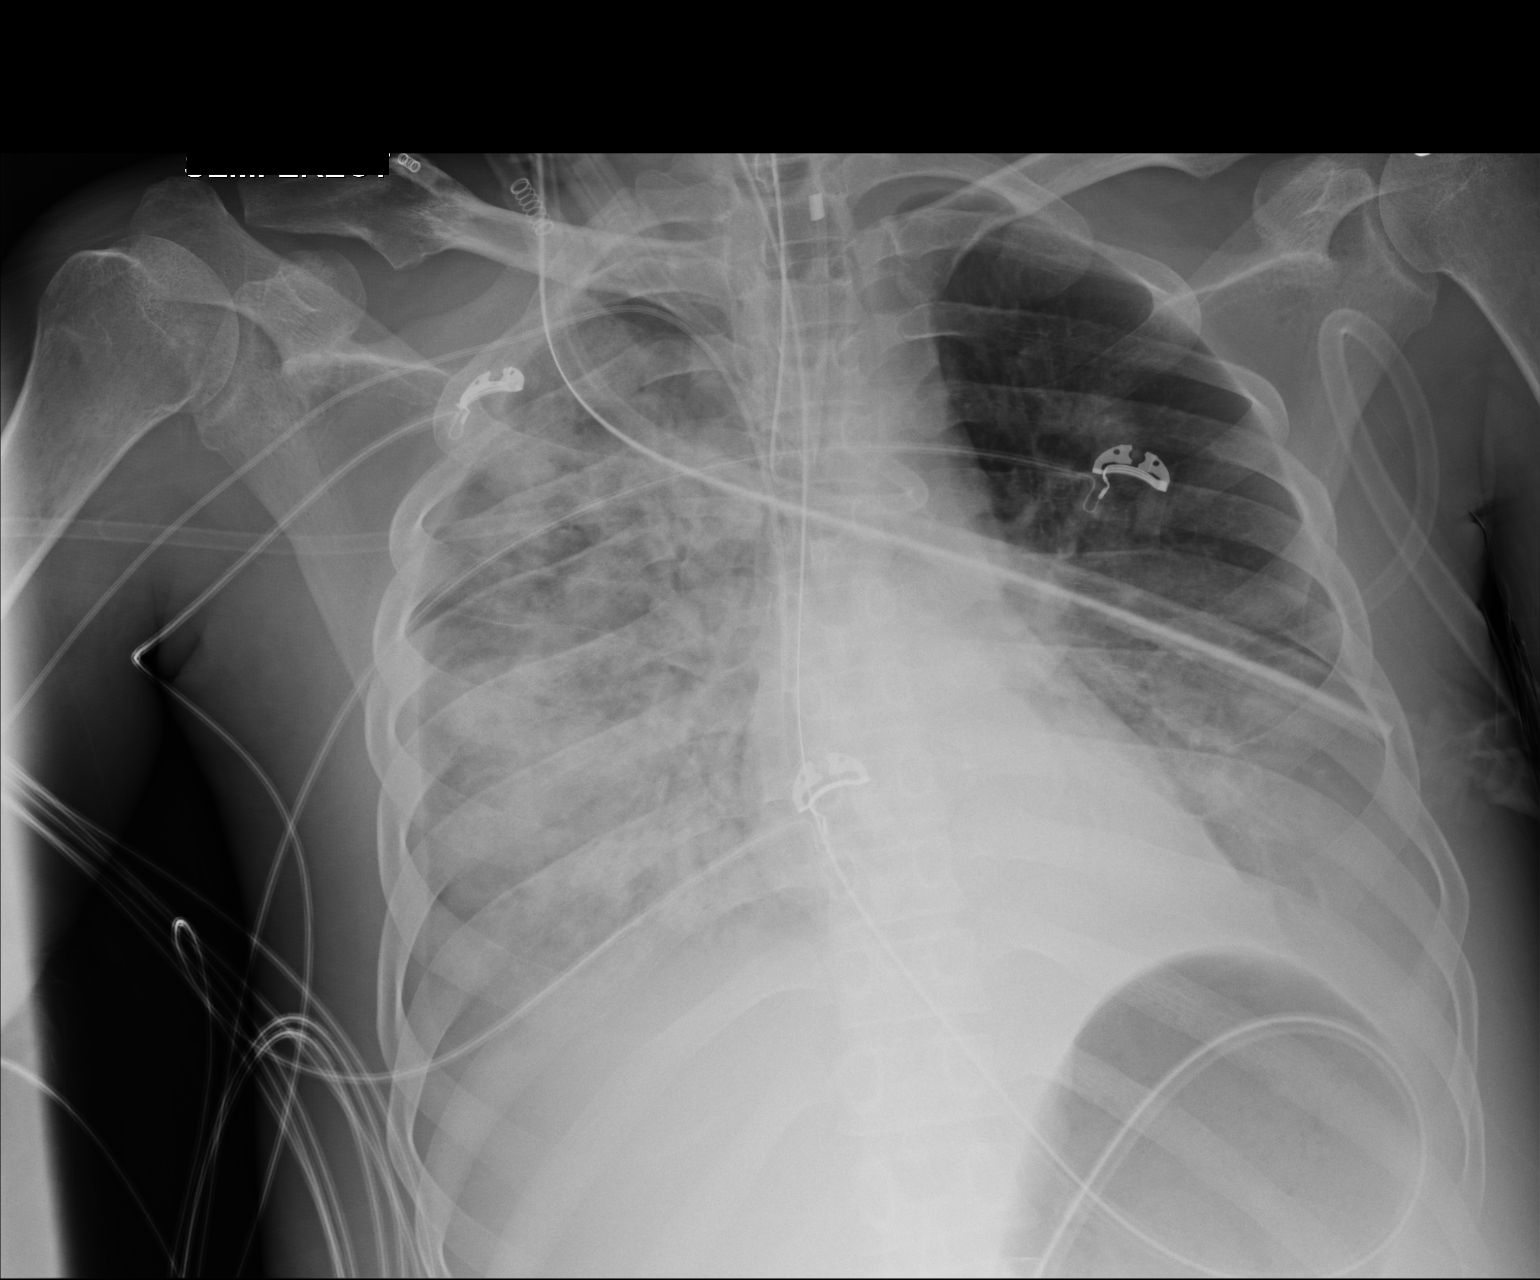

[1 of 1 positions shown; findings below may reference images not displayed]

FINDINGS: An endotracheal tube with tip 2 cm above the carina, right PICC line
with tip overlying the superior cavoatrial junction, and NG tube
within the stomach noted.

Airspace opacities throughout the right lung and left lower lung
again noted.

Left lower lung consolidation/ atelectasis and bilateral pleural
effusions are unchanged.

There is no evidence of pneumothorax.
IMPRESSION: Support apparatus as described. Endotracheal tube tip 2 cm above the
carina.

Unchanged bilateral airspace opacities, left lower lung
consolidation/atelectasis and bilateral pleural effusions.

## 2017-07-17 IMAGING — CR DG CHEST 1V PORT
1 series · 1 of 1 positions shown · non-contrast
Comparison: Portable chest x-ray May 08, 2015

CLINICAL DATA: Respiratory failure, sepsis, decubitus ulcers,
history of asthma and diabetes and previous CVA

EXAM:
PORTABLE CHEST - 1 VIEW

[AP]
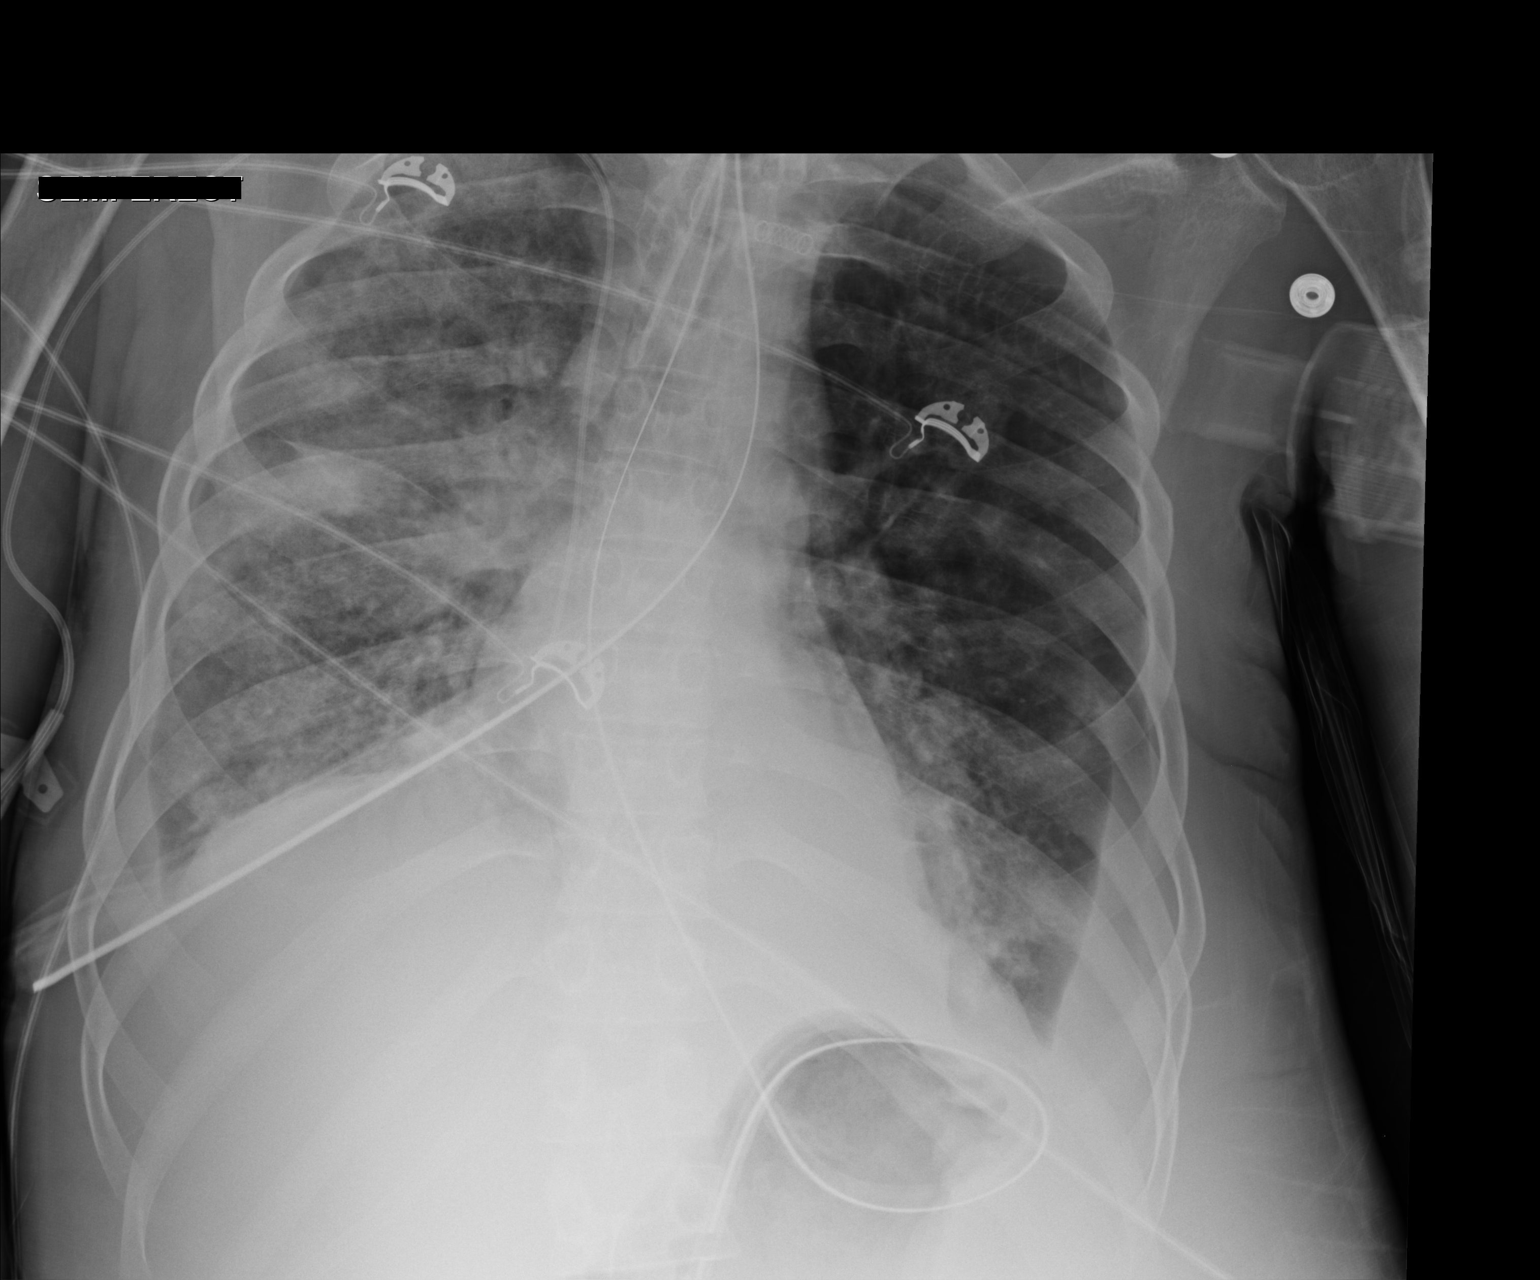

[1 of 1 positions shown; findings below may reference images not displayed]

FINDINGS: The lungs are adequately inflated. There are persistent small to
moderate-sized pleural effusions. There are patchy airspace
opacities on the right consistent with pneumonia or pulmonary edema.
Similar but less conspicuous findings are noted on the left in the
mid and lower lung. The heart is normal in size. The pulmonary
vascularity is not engorged. The endotracheal tube tip lies
approximately 3 cm above the carina. The esophagogastric tube tip
projects below the inferior margin of the image. The right
subclavian venous catheter has its tip at the cavoatrial junction.
IMPRESSION: Persistent bilateral airspace opacities greater on the right than on
the left consistent with pneumonia or pulmonary edema. There are
moderate-sized bilateral pleural effusions. The support tubes are in
reasonable position.

## 2017-07-18 IMAGING — DX DG CHEST 1V PORT
1 series · 1 of 1 positions shown · non-contrast
Comparison: Portable chest x-ray May 09, 2015

CLINICAL DATA: Pneumonia, intubated patient.

EXAM:
PORTABLE CHEST - 1 VIEW

[chest ap]
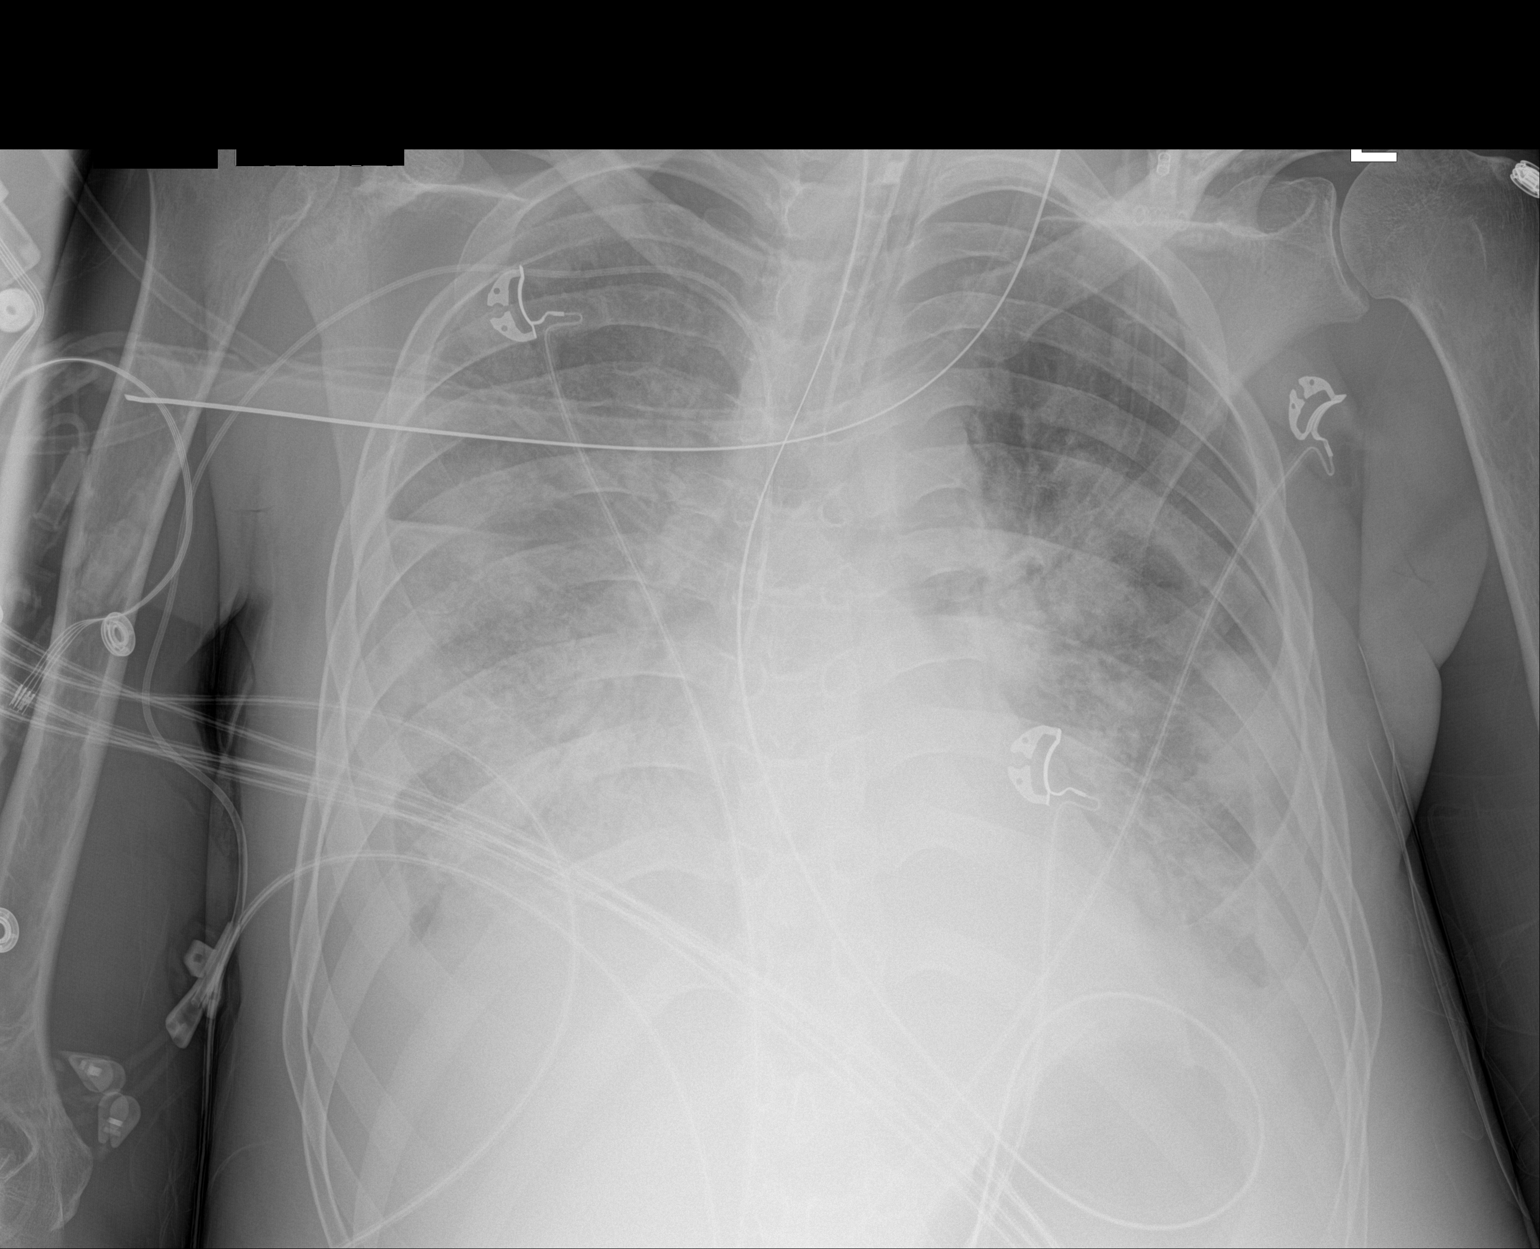

[1 of 1 positions shown; findings below may reference images not displayed]

FINDINGS: The appearance of the chest has deteriorated with increasing pleural
fluid volumes bilaterally. Interstitial edema has worsened as well.
The cardiac silhouette is ill defined as is the pulmonary
vascularity.

The endotracheal tube tip lies approximately 2 cm above the carina.
The esophagogastric tube tip projects below the inferior margin of
the image. The right PICC line tip projects over the midportion of
the SVC.
IMPRESSION: Interval deterioration in the appearance of the chest consistent
with worsening of bilateral pleural effusions and interstitial
edema/pneumonia.

## 2017-07-19 IMAGING — CR DG CHEST 1V PORT
1 series · 1 of 1 positions shown · non-contrast
Comparison: Chest radiograph 05/11/2015

CLINICAL DATA: Patient with acute respiratory failure and
hypoxemia.

EXAM:
PORTABLE CHEST - 1 VIEW

[AP]
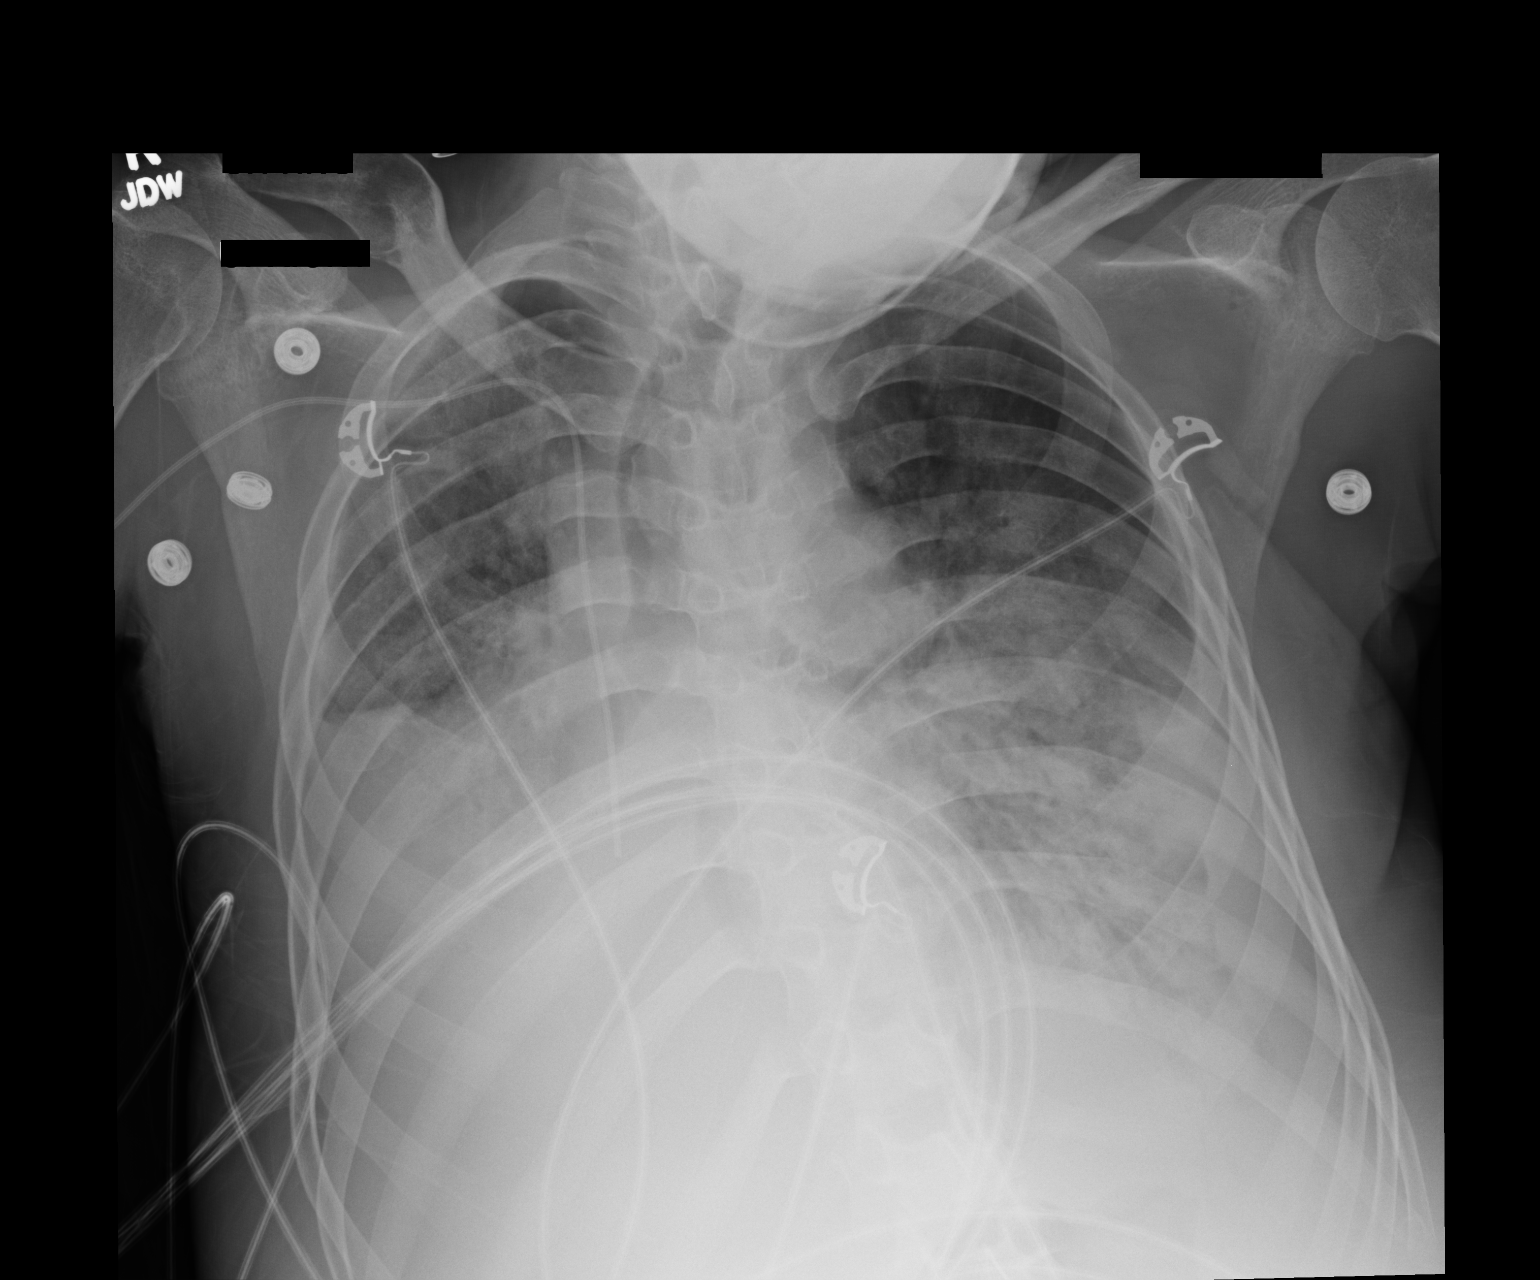

[1 of 1 positions shown; findings below may reference images not displayed]

FINDINGS: Monitoring leads overlie the patient. Right upper extremity PICC
line tip projects over the right atrium. The cardiac and mediastinal
contours are largely obscured. Interval extubation and removal of
enteric tube. Interval worsening of bilateral heterogeneous
pulmonary opacities with layering moderate bilateral pleural
effusions. No definite pneumothorax.
IMPRESSION: Interval worsening bilateral consolidative pulmonary opacities as
well as layering moderate bilateral pleural effusions. Findings are
nonspecific however may be secondary to edema or underlying
infection.

## 2017-07-19 IMAGING — CR DG CHEST 1V PORT
1 series · 1 of 1 positions shown · non-contrast
Comparison: 05/10/2015.

CLINICAL DATA: Intubation.

EXAM:
PORTABLE CHEST - 1 VIEW

[AP]
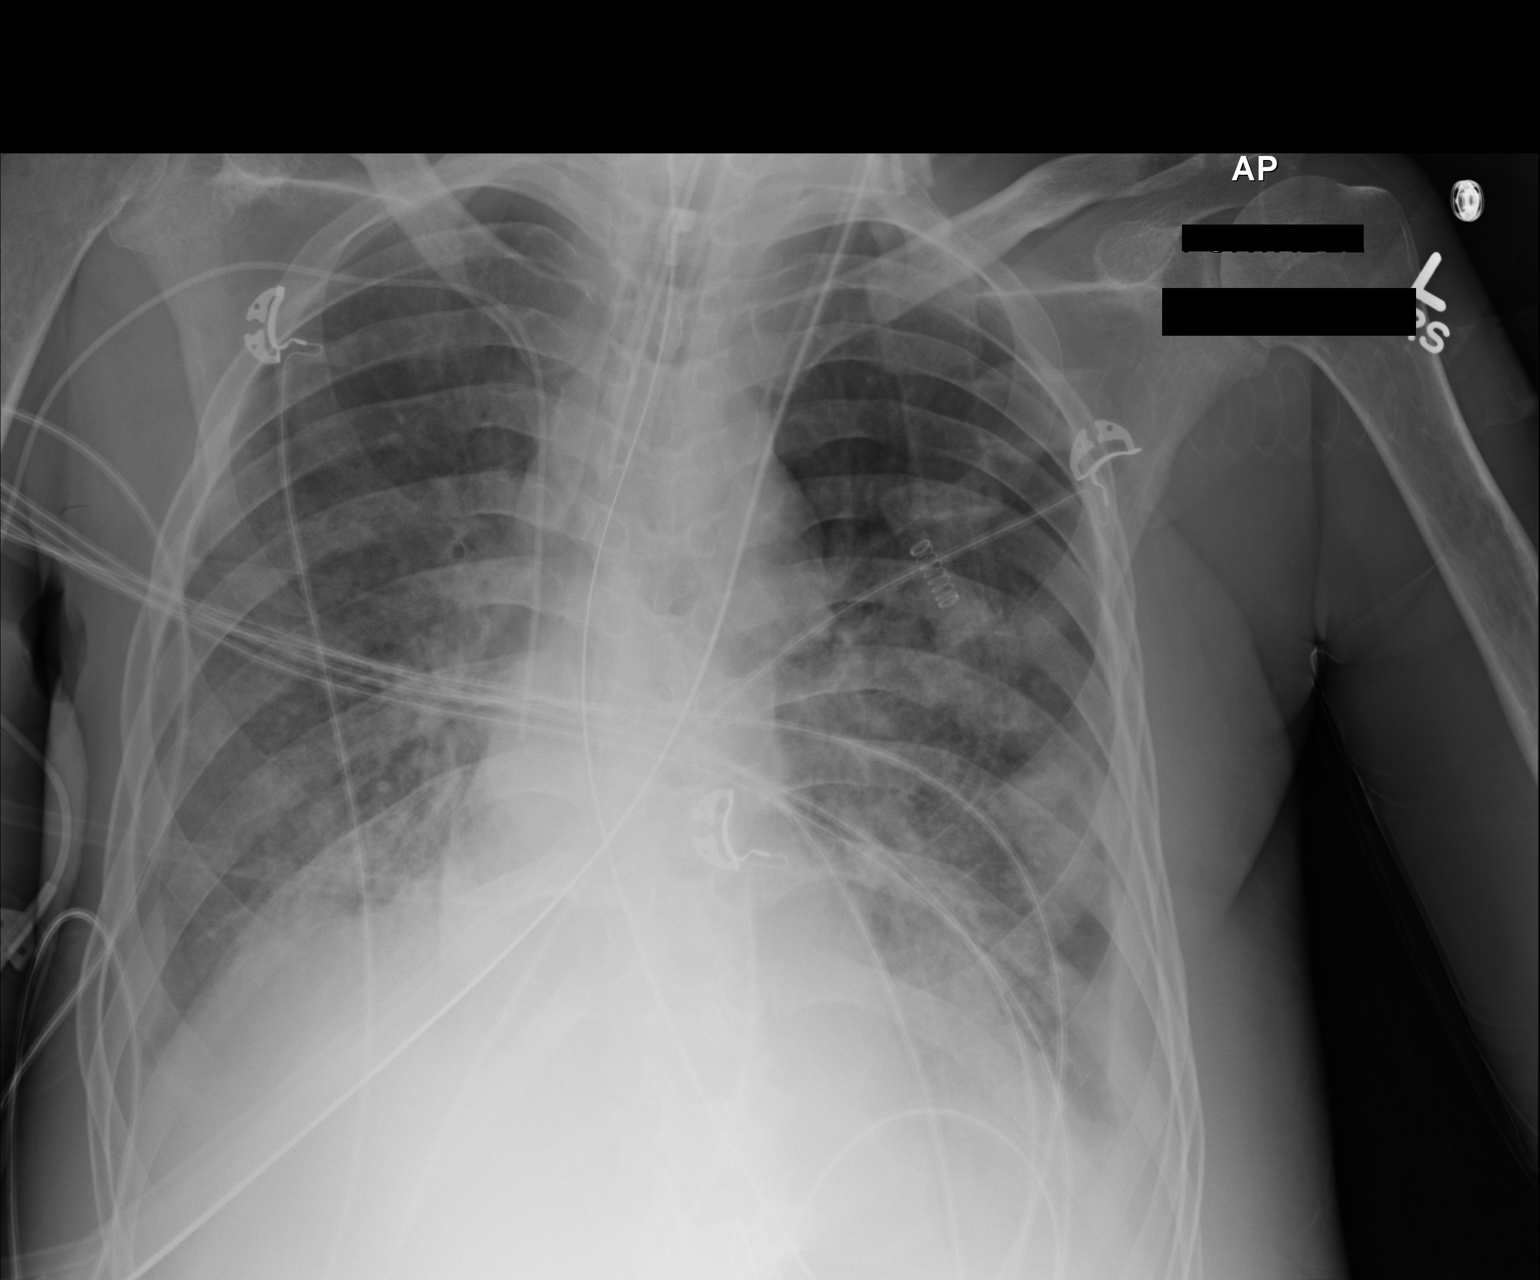

[1 of 1 positions shown; findings below may reference images not displayed]

FINDINGS: Endotracheal tube, NG tube, right PICC line in stable position.
Heart size normal. Partial clearing of diffuse bilateral pulmonary
infiltrates. Partial improvement of bilateral pleural effusions. No
pneumothorax. No acute bony abnormality.
IMPRESSION: 1. Lines and tubes in stable position.
2. Persistent but partially clearing diffuse bilateral pulmonary
infiltrates and pleural effusions. Heart size normal.

## 2017-07-20 IMAGING — CR DG CHEST 1V PORT
1 series · 1 of 1 positions shown · non-contrast
Comparison: 05/11/2015

CLINICAL DATA: Respiratory failure

EXAM:
PORTABLE CHEST - 1 VIEW

[AP]
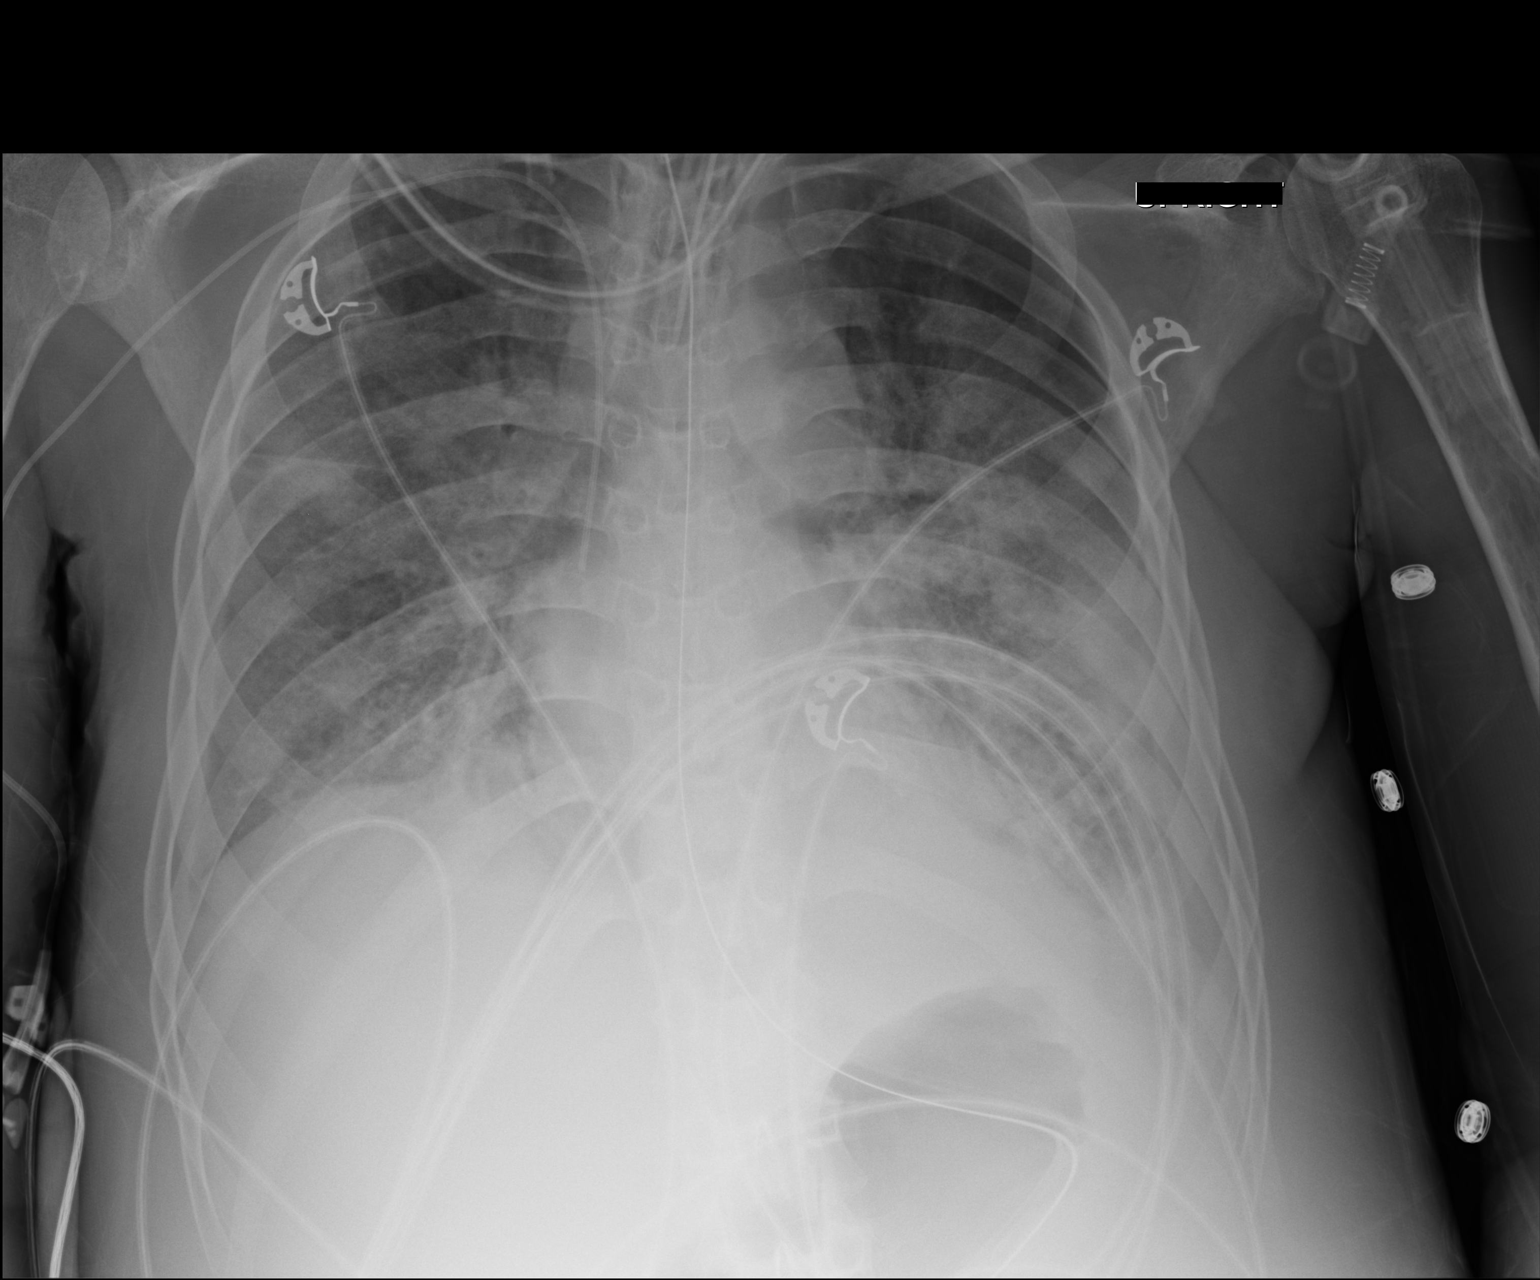

[1 of 1 positions shown; findings below may reference images not displayed]

FINDINGS: The endotracheal tube tip is 2.2 cm above the carina. There is a
right upper extremity PICC line extending into the low SVC. The
nasogastric tube extends into the stomach. No pneumothorax is
evident. Airspace opacities persist in the central and basilar
regions bilaterally.
IMPRESSION: Support equipment appears satisfactorily positioned.

No significant interval change in the bilateral airspace opacities.

## 2017-07-21 IMAGING — CR DG CHEST 1V PORT
1 series · 1 of 1 positions shown · non-contrast
Comparison: 05/12/2015.

CLINICAL DATA: Pneumonia.

EXAM:
PORTABLE CHEST - 1 VIEW

[AP]
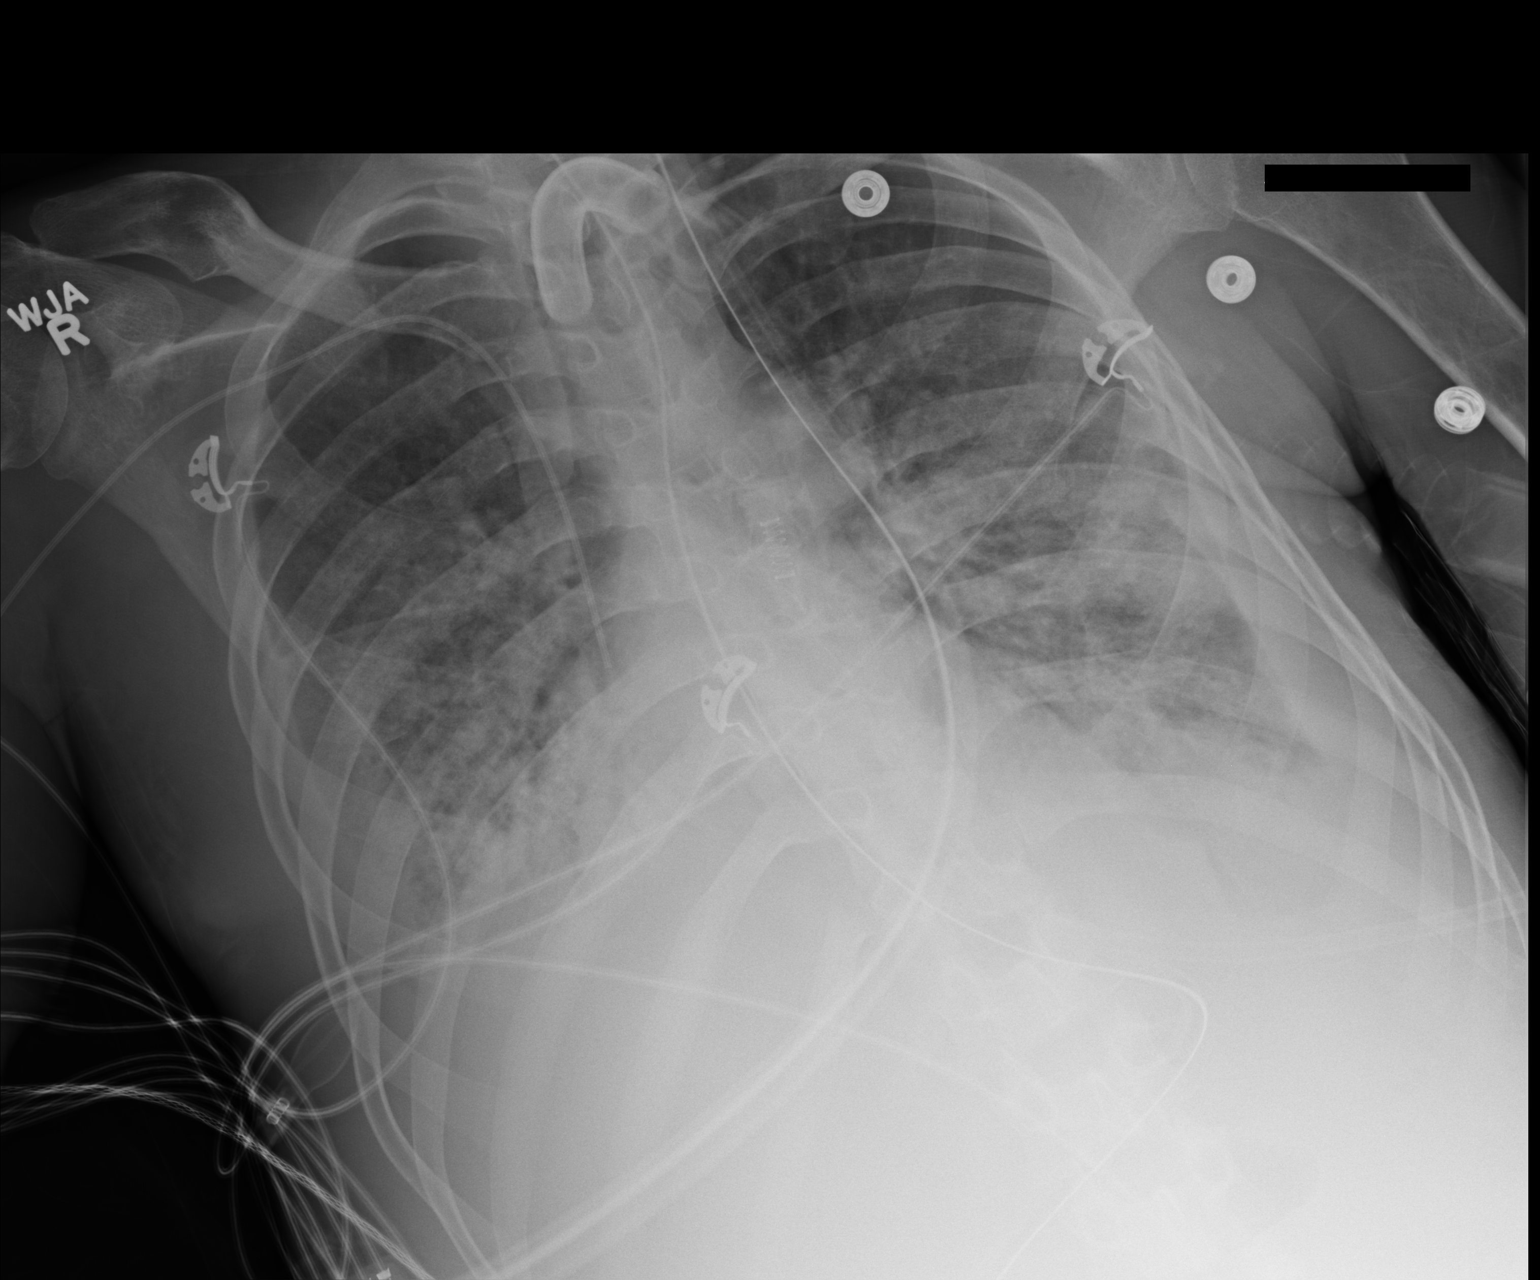

[1 of 1 positions shown; findings below may reference images not displayed]

FINDINGS: Tracheostomy tube and NG tube in stable position. Right PICC line
stable position. Heart size normal. Diffuse bilateral pulmonary
infiltrates and bilateral pleural effusions again noted. No
significant interim change. No pneumothorax.
IMPRESSION: 1.  Lines and tubes in stable position.

2. Diffuse unchanged bilateral airspace disease and pleural
effusions.

## 2017-07-22 IMAGING — XA IR IVC FILTER PLMT / S&I /IMG GUID/MOD SED
1 series · 12 of 12 positions shown · non-contrast
Comparison: none

CLINICAL DATA: 28-year-old male with a history of quadriplegia.

[Series 1: run · 12 of 12 slices shown]
[im 1/12]
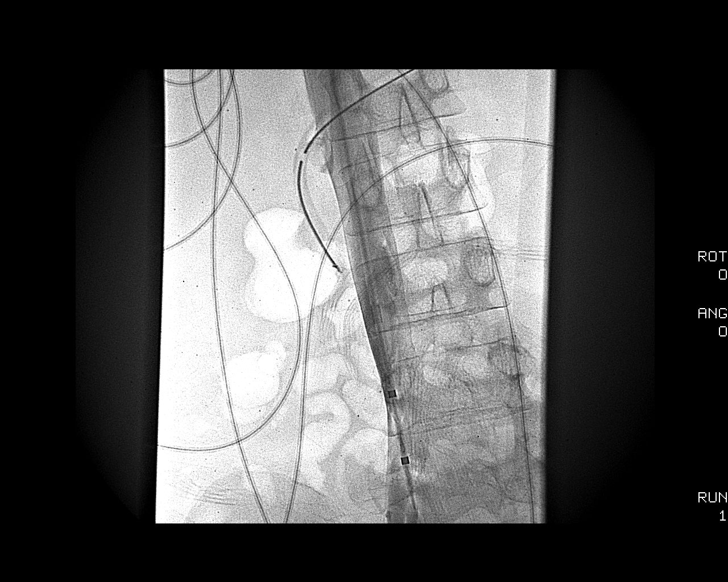
[im 2/12]
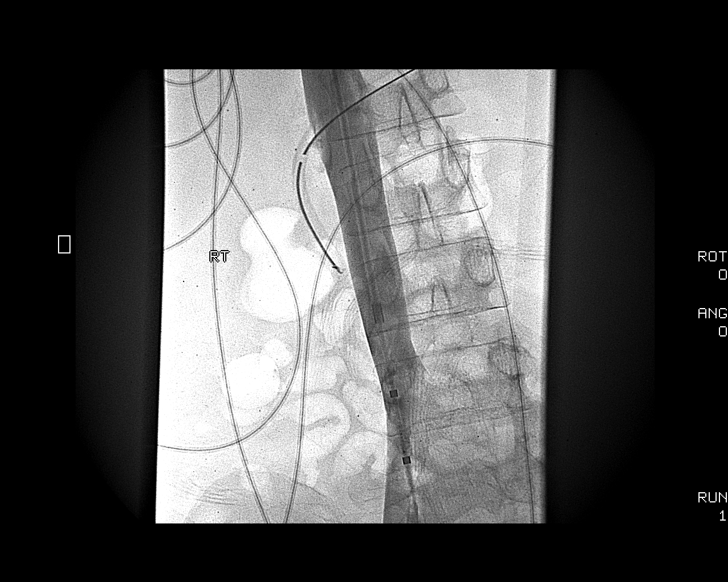
[im 3/12]
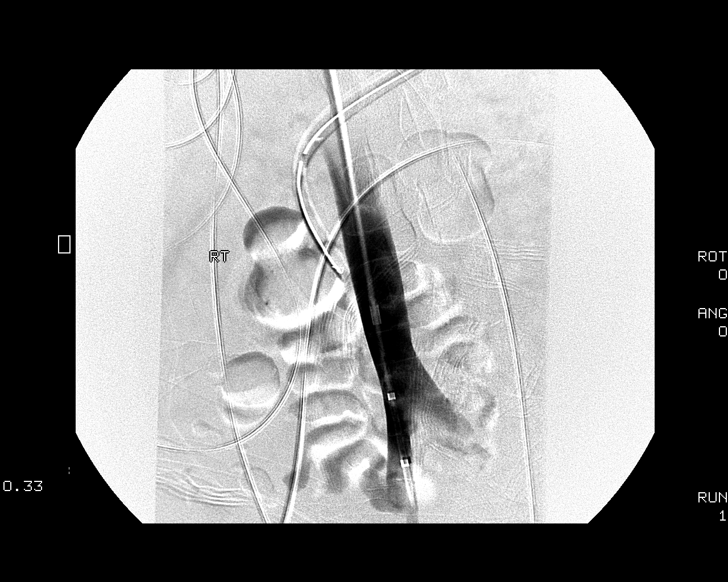
[im 4/12]
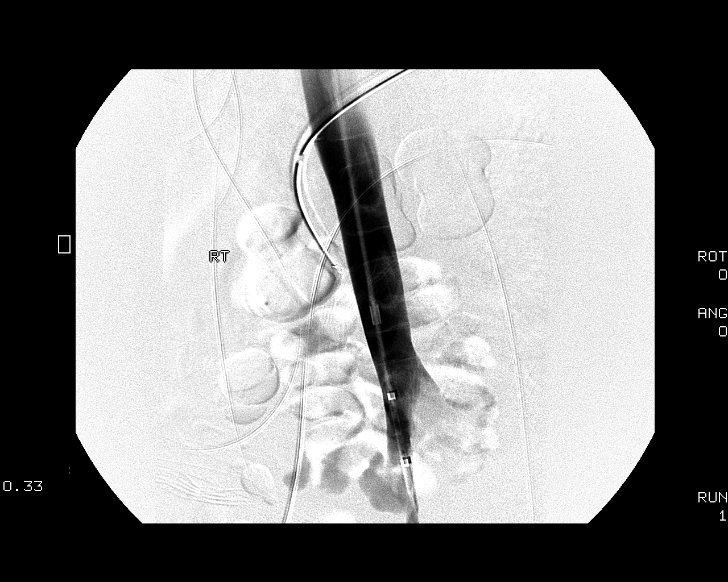
[im 5/12]
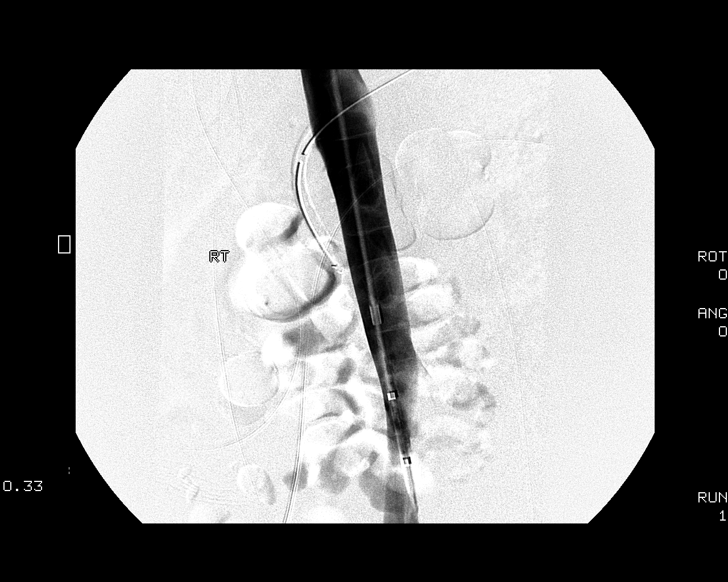
[im 6/12]
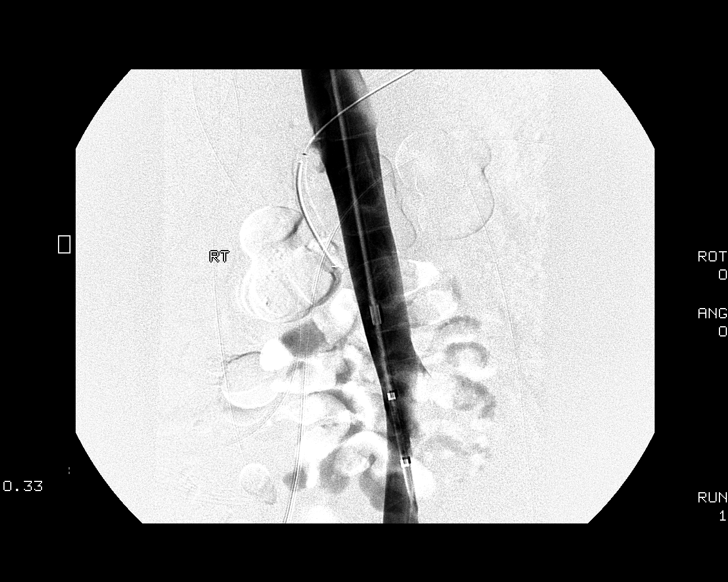
[im 7/12]
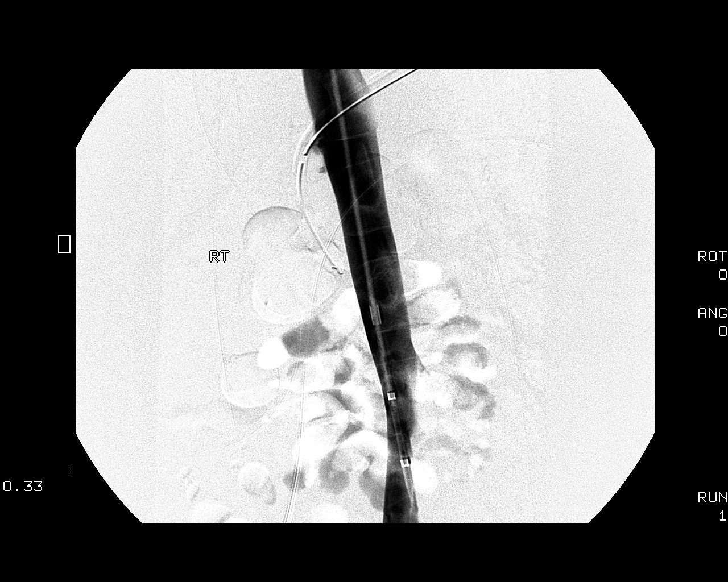
[im 8/12]
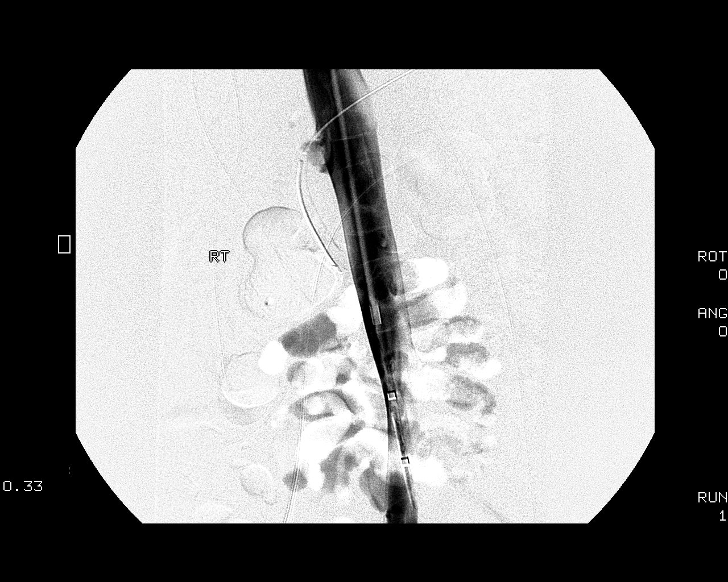
[im 9/12]
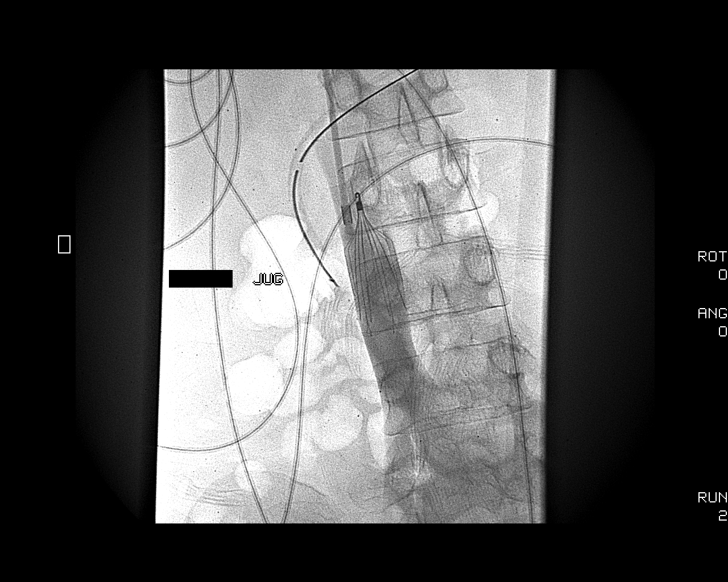
[im 10/12]
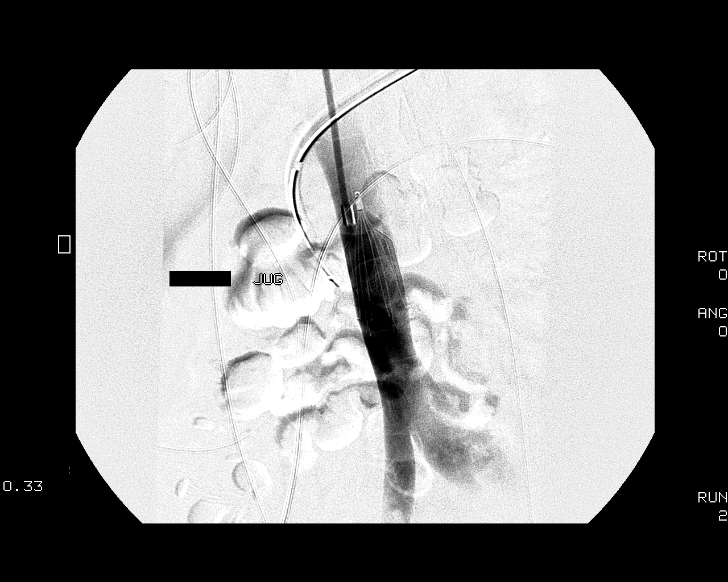
[im 11/12]
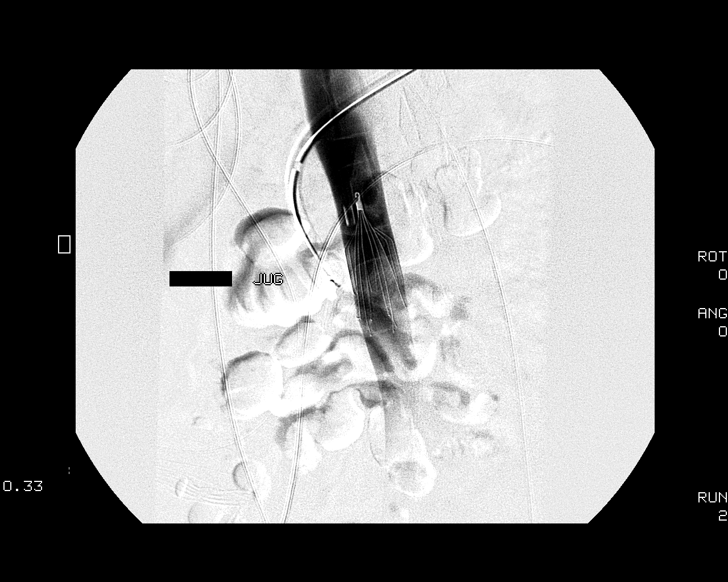
[im 12/12]
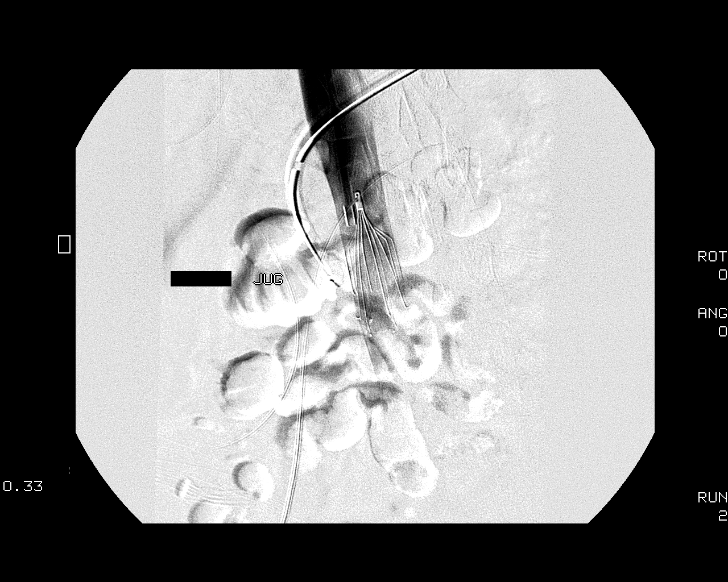

[12 of 12 positions shown; findings below may reference images not displayed]

The patient has had excessive bleeding after multiple debridement of
sacral decubitus ulcer.

Given that he cannot receive anti coagulation, and has had recent
pulmonary embolism and thrombus of the lower extremity, IVC filter
is indicated.

EXAM:
1. ULTRASOUND GUIDANCE FOR VASCULAR ACCESS OF THE RIGHT INTERNAL
JUGULAR VEIN.
2. IVC VENOGRAM.
3. PERCUTANEOUS IVC FILTER PLACEMENT.

ANESTHESIA/SEDATION:
Fifty mcg IV Fentanyl.

Total Moderate Sedation Time

[employer].

CONTRAST:  45mL OMNIPAQUE IOHEXOL 300 MG/ML  SOLN

FLUOROSCOPY TIME:  54 seconds

PROCEDURE:
The procedure, risks, benefits, and alternatives were explained to
the patient and the patient's family. Questions regarding the
procedure were encouraged and answered. The patient understands and
consents to the procedure.

The patient was prepped with Betadine in a sterile fashion, and a
sterile drape was applied covering the operative field. A sterile
gown and sterile gloves were used for the procedure. Local
anesthesia was provided with 1% Lidocaine.

Under direct ultrasound guidance, a 21 gauge needle was advanced
into the right internal jugular vein with ultrasound image
documentation performed. After securing access with a micropuncture
dilator, a guidewire was advanced into the inferior vena cava. A
deployment sheath was advanced over the guidewire. This was utilized
to perform IVC venography.

The deployment sheath was further positioned in an appropriate
location for filter deployment. A retrievable Ivet Egger IVC filter
was then advanced in the sheath. This was then fully deployed in the
infrarenal IVC. Final filter position was confirmed with a
fluoroscopic image. Contrast injection was also performed through
the sheath under fluoroscopy to confirm patency of the IVC at the
level of the filter. After the procedure the sheath was removed and
hemostasis obtained with manual compression.

Patient tolerated the procedure well and remained hemodynamically
stable throughout.

No complications were encountered and no significant blood loss was
encountered.

COMPLICATIONS:
None.
FINDINGS: IVC venography demonstrates a normal caliber IVC with no evidence of
thrombus. Renal veins are identified bilaterally at the level of L1.
The IVC filter was successfully positioned below the level of the
renal veins and is appropriately oriented. This IVC filter has both
permanent and retrievable indications.
IMPRESSION: Status post IVC filter placement.

This filter is potentially retrievable, if the patient is ever
deemed a candidate for anti coagulation.

## 2017-07-22 IMAGING — CR DG CHEST 1V PORT
1 series · 1 of 1 positions shown · non-contrast
Comparison: Chest x-ray 05/13/2015.

CLINICAL DATA: 28-year-old male with history of pneumonia.

EXAM:
PORTABLE CHEST - 1 VIEW

[AP]
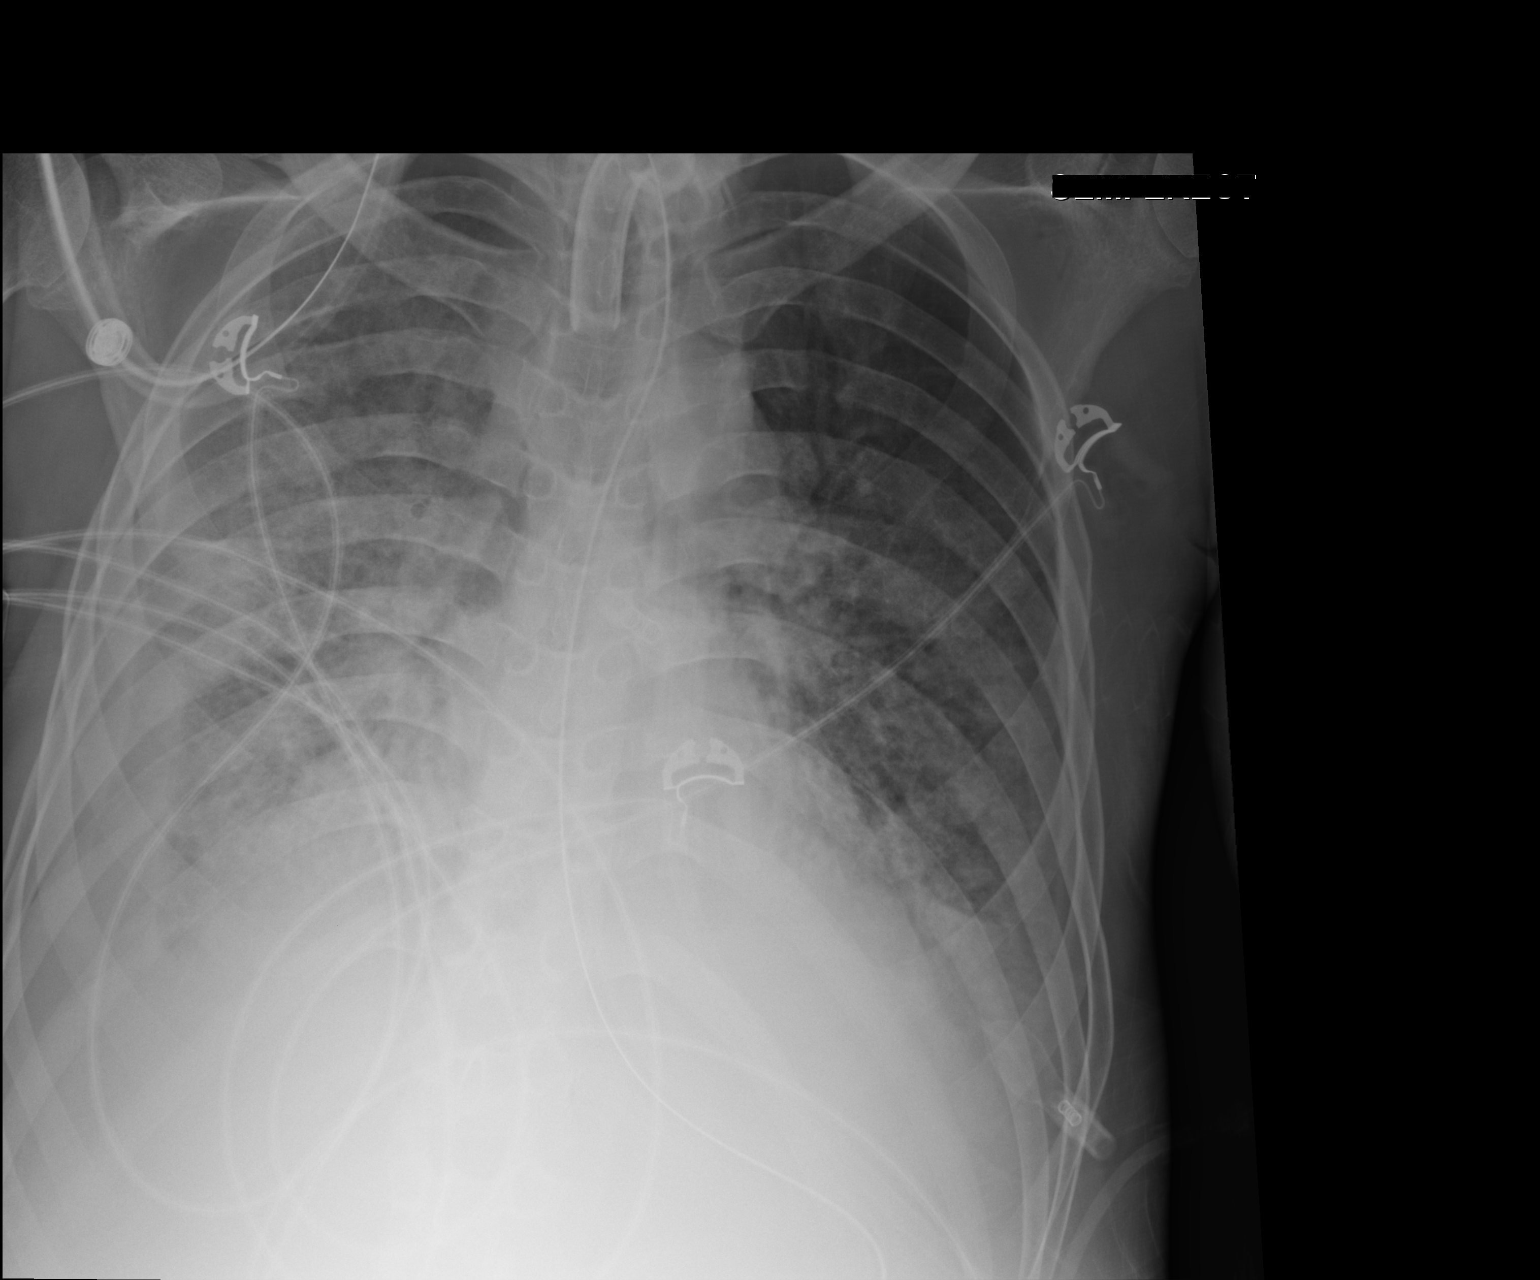

[1 of 1 positions shown; findings below may reference images not displayed]

FINDINGS: A tracheostomy tube is in place with tip 4.2 cm above the carina. A
nasogastric tube is seen extending into the stomach, however, the
tip of the nasogastric tube extends below the lower margin of the
image. Previously noted right upper extremity PICC is been removed.
Lung volumes are low. Persistent multifocal airspace disease in the
lungs bilaterally (right greater than left), concerning for
multilobar pneumonia. Moderate right pleural effusion. Small left
pleural effusion. Extensive bibasilar opacities also likely reflect
some concurrent atelectasis. Cardiac silhouette is largely obscured,
but does not appear enlarged. Upper mediastinal contours are
distorted by patient positioning.
IMPRESSION: 1. Support apparatus, as above.
2. Asymmetrically distributed airspace disease in the lungs
bilaterally, most compatible with severe multilobar pneumonia.
3. Small left and moderate right-sided pleural effusions.

## 2017-07-29 IMAGING — CR DG CHEST 1V PORT
1 series · 1 of 1 positions shown · non-contrast
Comparison: May 14, 2015.

CLINICAL DATA: Respiratory failure.

EXAM:
PORTABLE CHEST - 1 VIEW

[AP]
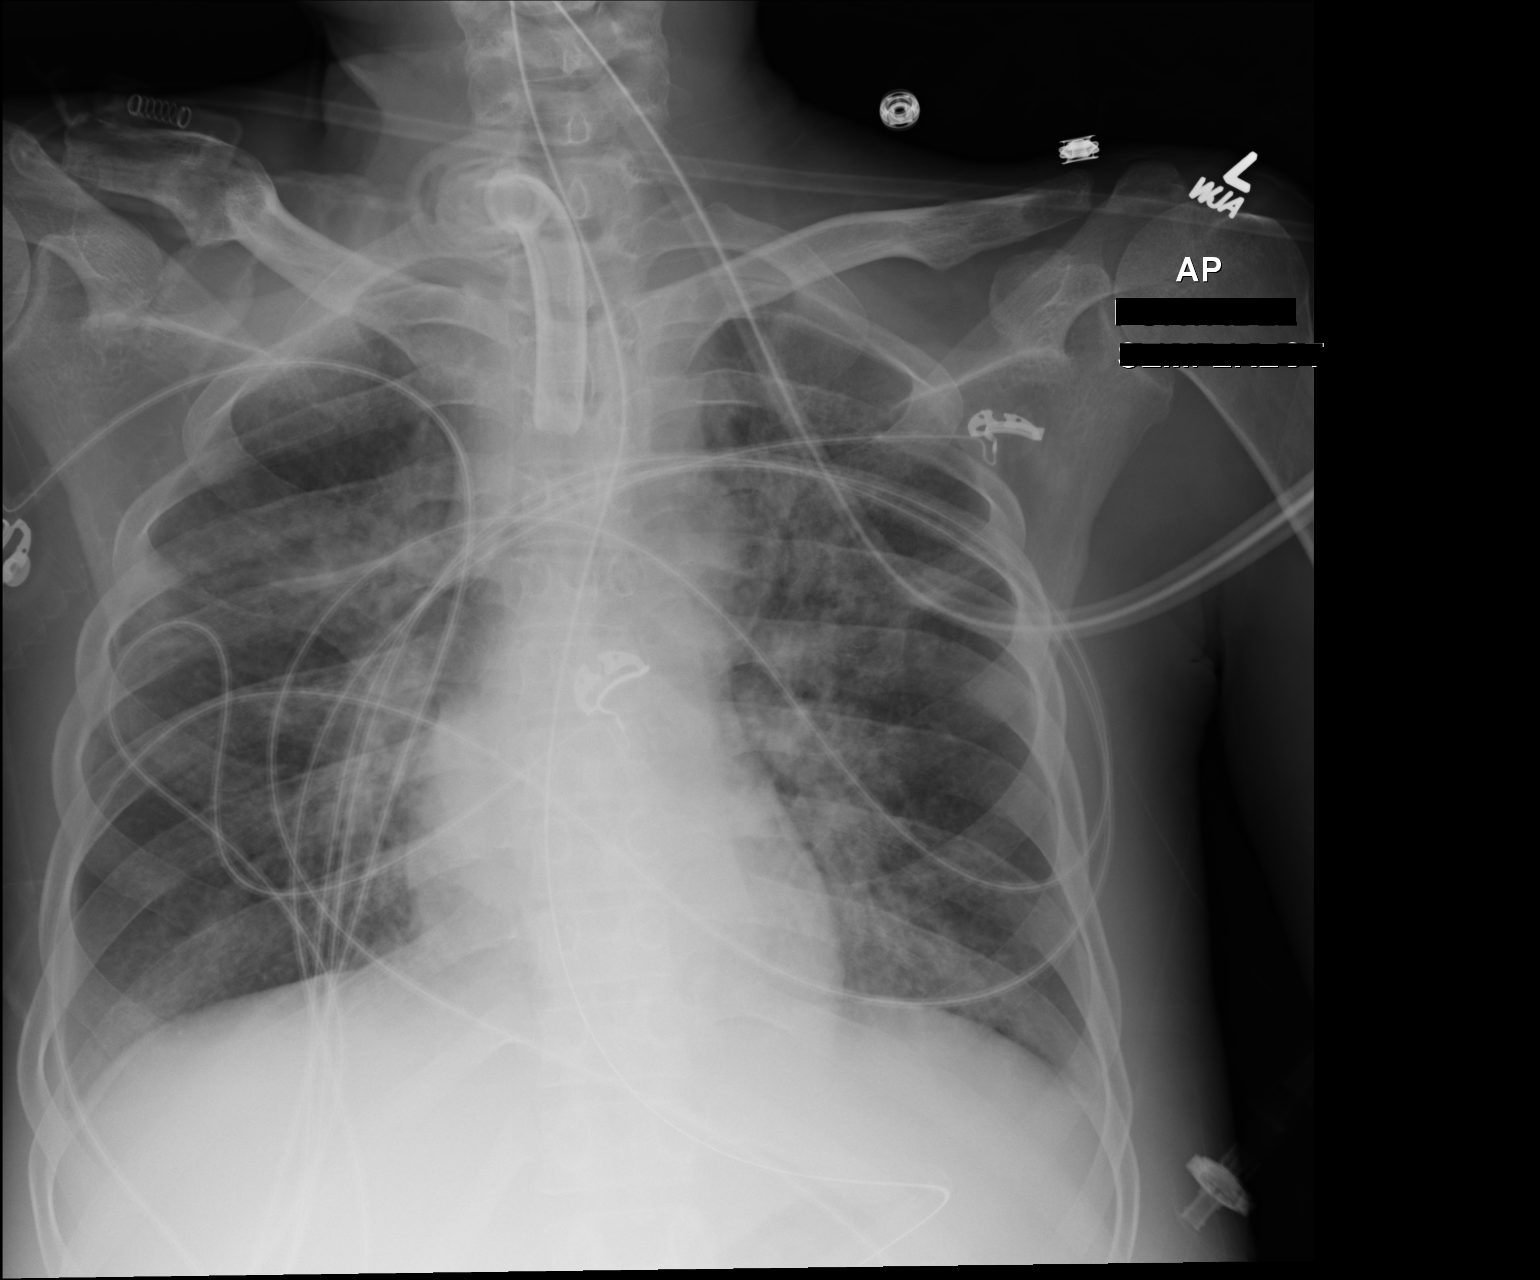

[1 of 1 positions shown; findings below may reference images not displayed]

FINDINGS: Tracheostomy tube and nasogastric tube are unchanged in position. No
pneumothorax or significant pleural effusion is noted. Bilateral
perihilar and left basilar opacities are noted concerning for edema
or pneumonia. These are improved compared to prior exam. Bony thorax
is intact.
IMPRESSION: Improved bilateral lung opacities are noted consistent with
improving pneumonia or edema. Stable support apparatus.

## 2017-08-03 IMAGING — XA IR PERC PLACEMENT GASTROSTOMY
1 series · 8 of 8 positions shown · non-contrast
Comparison: none

CLINICAL DATA: 28-year-old male with respiratory failure,
deconditioning and protein calorie malnutrition. Percutaneous
gastrostomy tube is warranted.
TECHNIQUE: Informed consent was obtained from the patient following explanation
of the procedure, risks, benefits and alternatives. The patient
understands, agrees and consents for the procedure. All questions
were addressed. A time out was performed.

[Series 1: run · 8 of 8 slices shown]
[im 1/8]
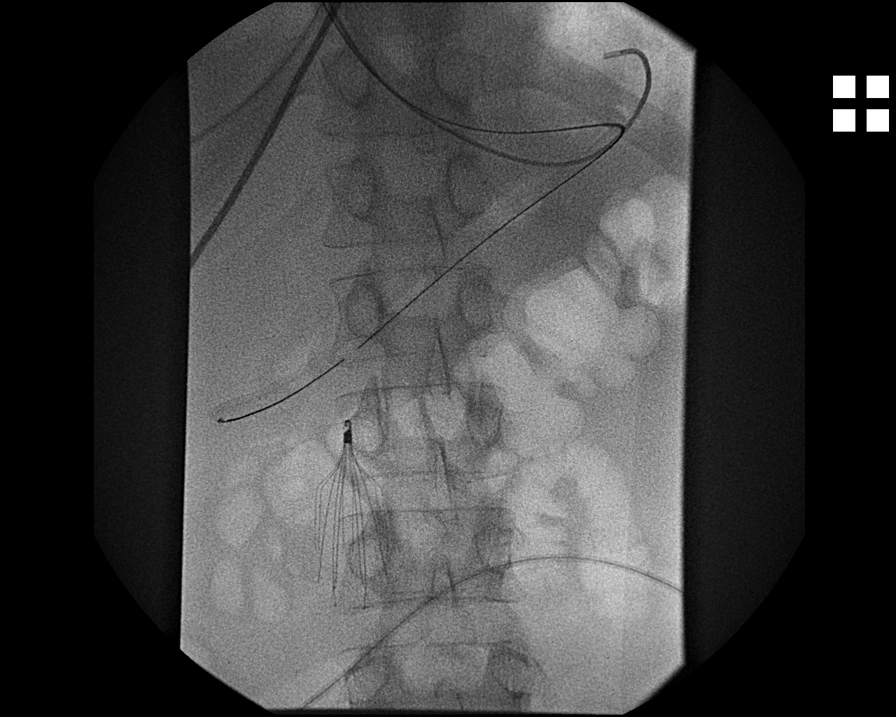
[im 2/8]
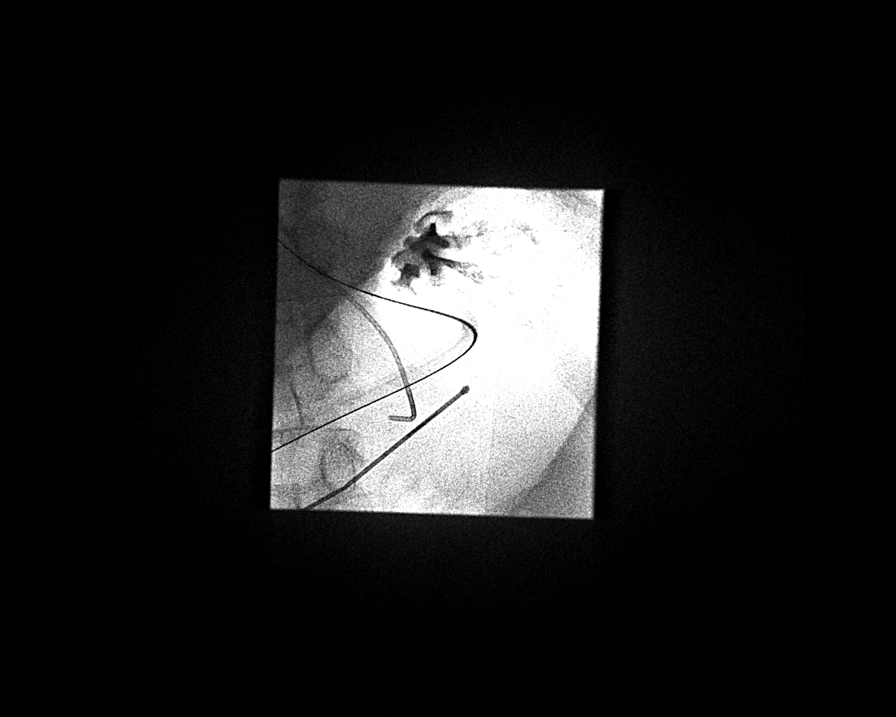
[im 3/8]
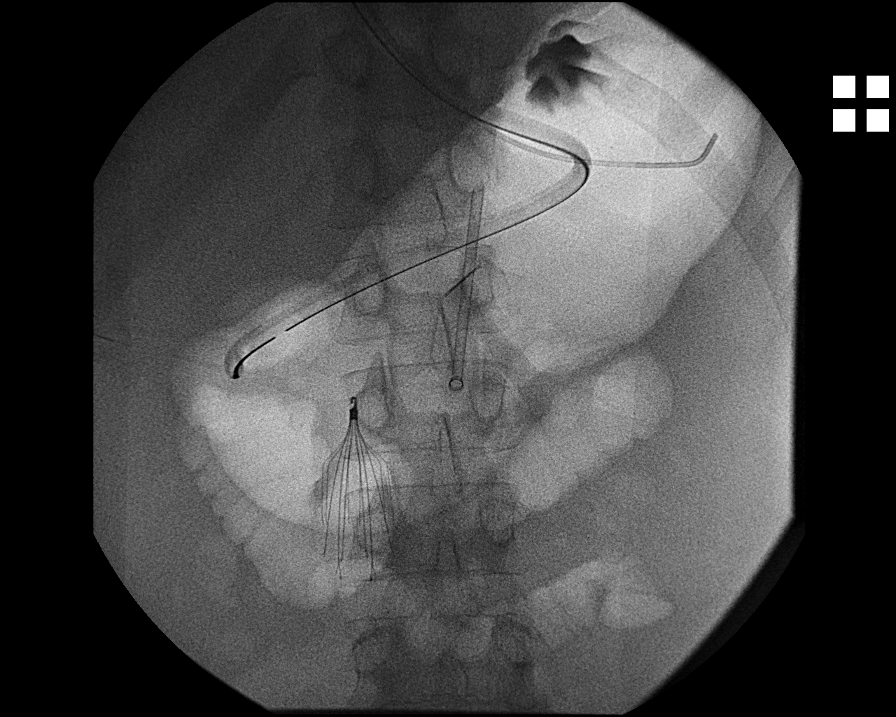
[im 4/8]
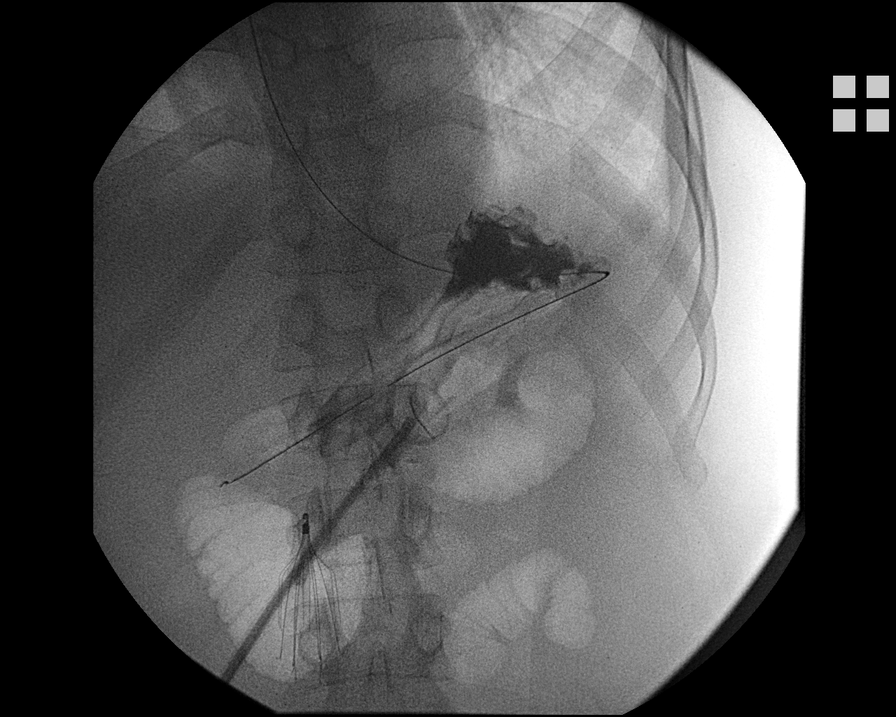
[im 5/8]
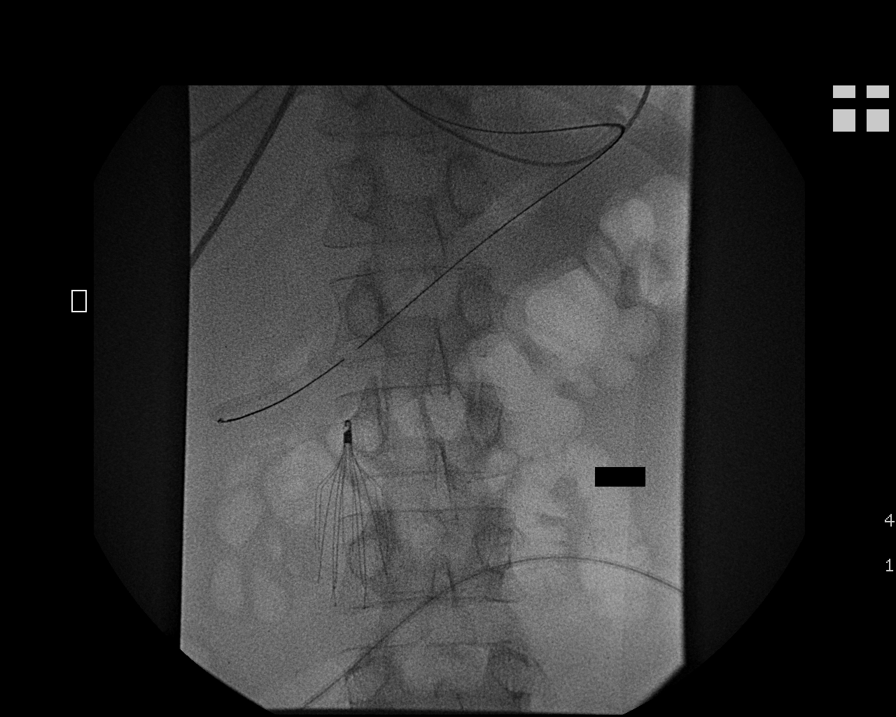
[im 6/8]
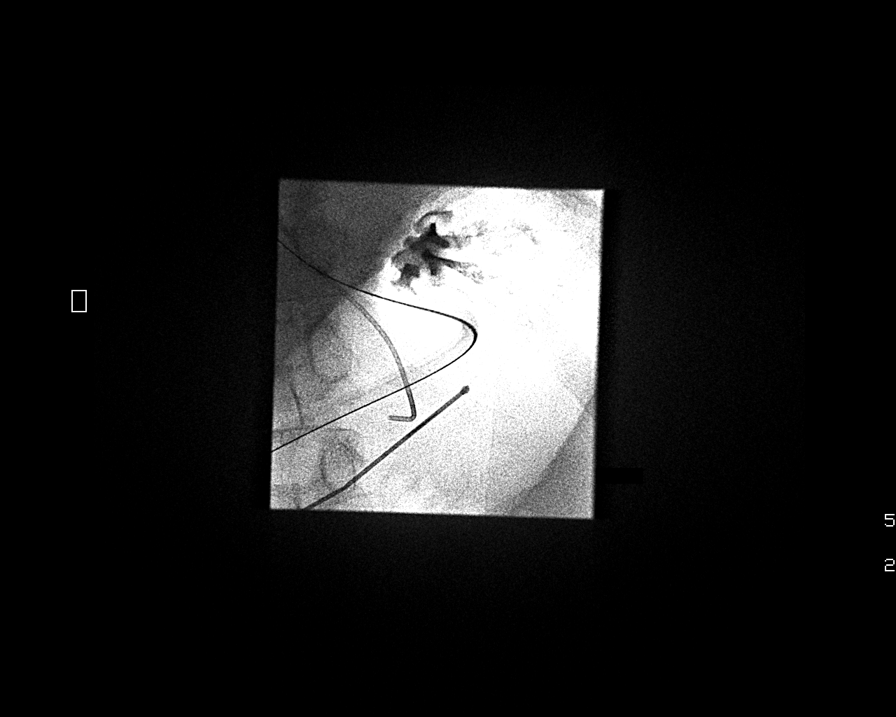
[im 7/8]
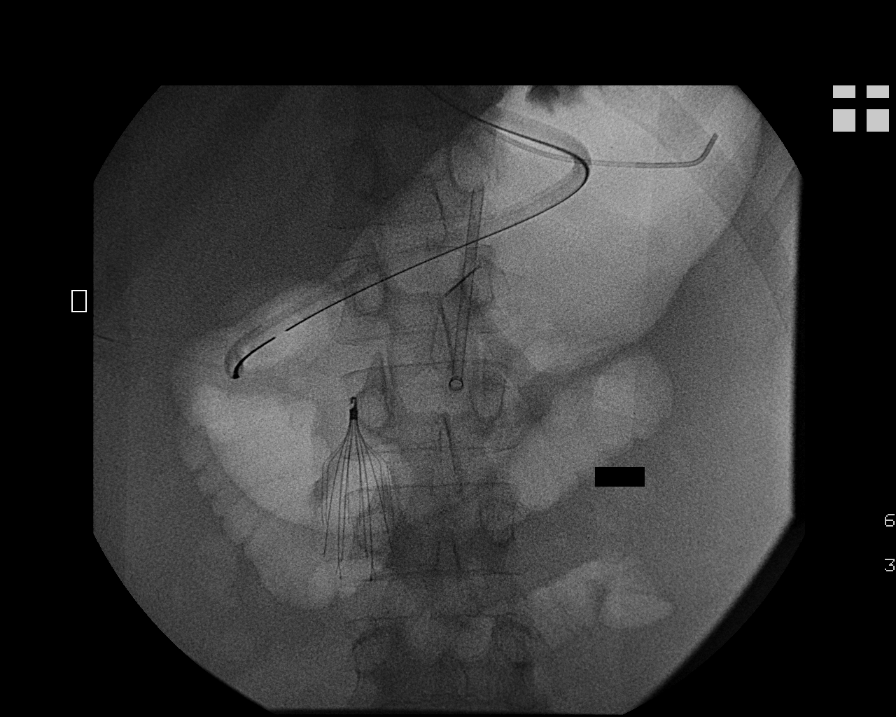
[im 8/8]
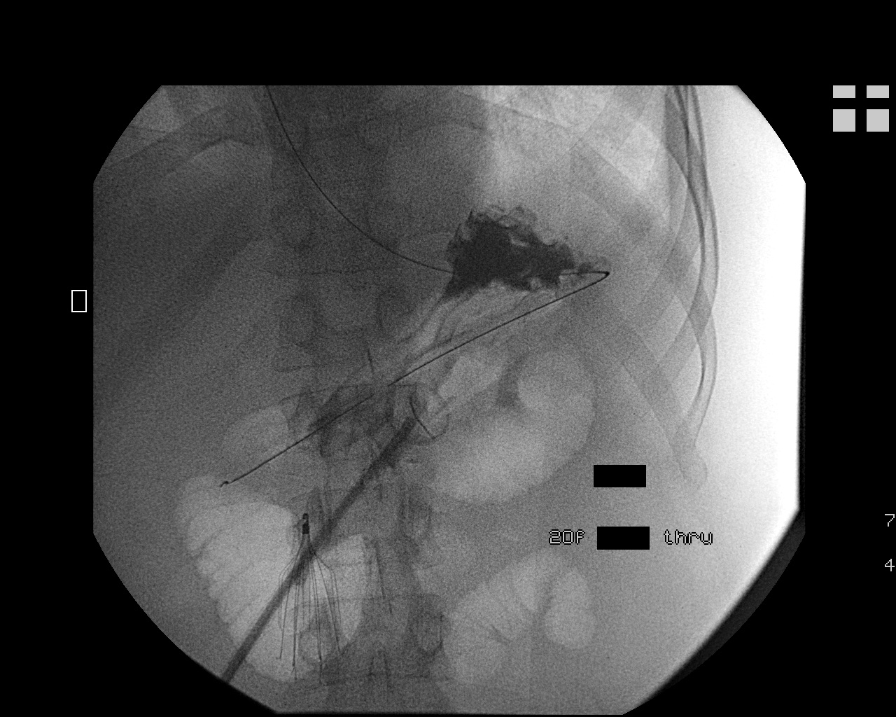

[8 of 8 positions shown; findings below may reference images not displayed]

EXAM:
PERC PLACEMENT GASTROSTOMY

Date: 05/26/2015

PROCEDURE:
1. Fluoroscopically guided placement of percutaneous pull-through
gastrostomy tube.

ANESTHESIA/SEDATION:
Moderate (conscious) sedation was used. 2 mg Versed, 100 mcg
Fentanyl were administered intravenously. The patient's vital signs
were monitored continuously by radiology nursing throughout the
procedure.

Sedation Time: 10 minutes

FLUOROSCOPY TIME:  2 minutes 18 seconds

20.64 mGy

CONTRAST:  10mL OMNIPAQUE IOHEXOL 300 MG/ML  SOLN

MEDICATIONS:
Patient is on multiple antibiotics including tobramycin (160 mg)
zyvox (600 mg) and primaxin (500 mg). No additional antibiotic
prophylaxis was ordered.
Maximal barrier sterile technique utilized including caps, mask,
sterile gowns, sterile gloves, large sterile drape, hand hygiene,
and chlorhexadine skin prep.

An angled catheter was advanced over a wire under fluoroscopic
guidance through the nose, down the esophagus and into the body of
the stomach. The stomach was then insufflated with several 100 ml of
air. Fluoroscopy confirmed location of the gastric bubble, as well
as inferior displacement of the barium stained colon. Under direct
fluoroscopic guidance, a single T-tack was placed, and the anterior
gastric wall drawn up against the anterior abdominal wall.
Percutaneous access was then obtained into the mid gastric body with
an 18 gauge sheath needle. Aspiration of air, and injection of
contrast material under fluoroscopy confirmed needle placement.

An Amplatz wire was advanced in the gastric body and the access
needle exchanged for a 9-French vascular sheath. A snare device was
advanced through the vascular sheath and an Amplatz wire advanced
through the angled catheter. The Amplatz wire was successfully
snared and this was pulled up through the esophagus and out the
mouth. A 20-Jon Andoni Bringas tube was then connected to
the snare and pulled through the mouth, down the esophagus, into the
stomach and out to the anterior abdominal wall. Hand injection of
contrast material confirmed intragastric location. The T-tack
retention suture was then cut. The pull through peg tube was then
secured with the external bumper and capped.

The patient will be observed for several hours with the newly placed
tube on low wall suction to evaluate for any post procedure
complication. The patient tolerated the procedure well, there is no
immediate complication.
IMPRESSION: Successful placement of a 20 French pull through gastrostomy tube.

## 2017-08-07 IMAGING — CR DG CHEST 1V PORT
1 series · 1 of 1 positions shown · non-contrast
Comparison: 05/21/2015

CLINICAL DATA: Pneumonia and tracheostomy

EXAM:
PORTABLE CHEST - 1 VIEW

[AP]
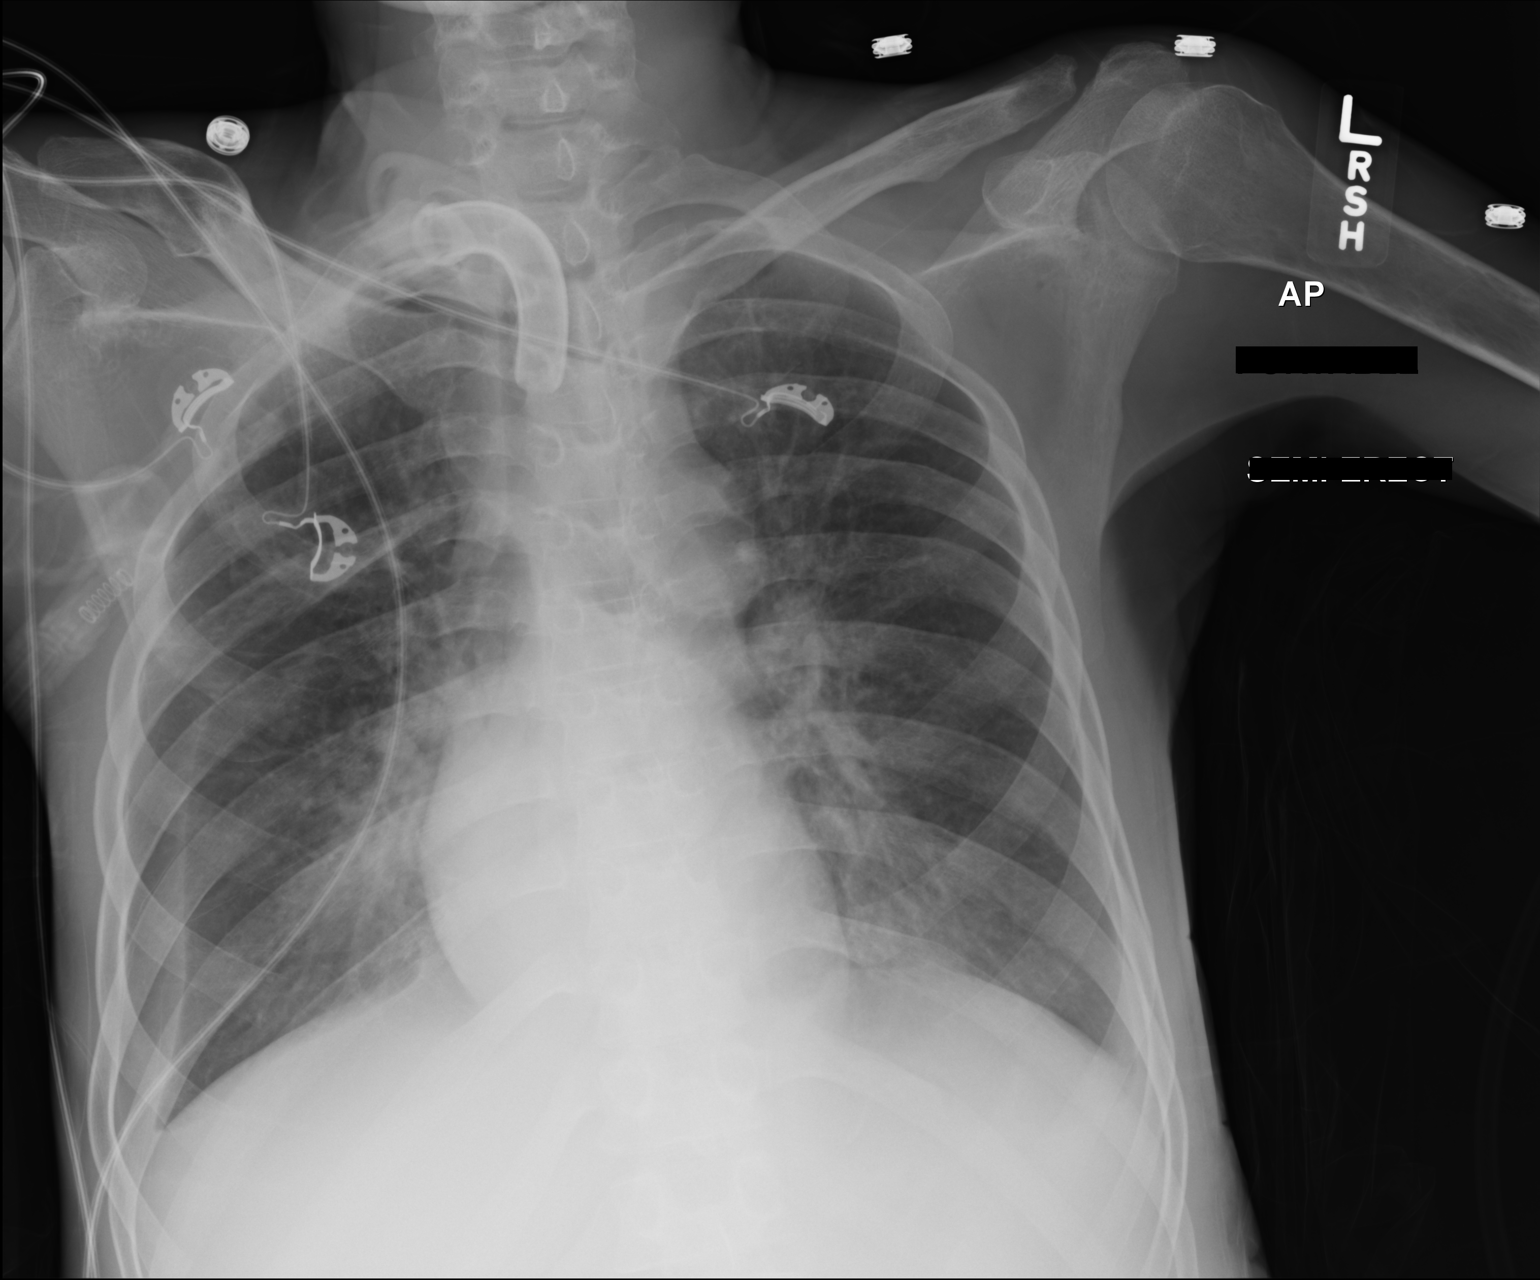

[1 of 1 positions shown; findings below may reference images not displayed]

FINDINGS: Tracheostomy tube remains well seated. Nasogastric tube is been
removed.

Normal heart size and stable upper mediastinal contours, including
abnormal rounded appearance of the aortic knob where there was
adenopathy on chest CT 02/20/2015.

Improved lung aeration, although there is still bilateral infrahilar
airspace disease. Bilateral layering pleural effusion, with fissural
extension on the right. No air leak.
IMPRESSION: 1. Improved pneumonia, currently seen in the bilateral infrahilar
lung.
2. Bilateral layering pleural effusion.

## 2017-09-17 IMAGING — CR DG CHEST 1V PORT
1 series · 1 of 1 positions shown · non-contrast
Comparison: 05/30/2015

CLINICAL DATA: Altered mental status. History of asthma, pneumonia,
stroke, diabetes

EXAM:
PORTABLE CHEST 1 VIEW

[AP]
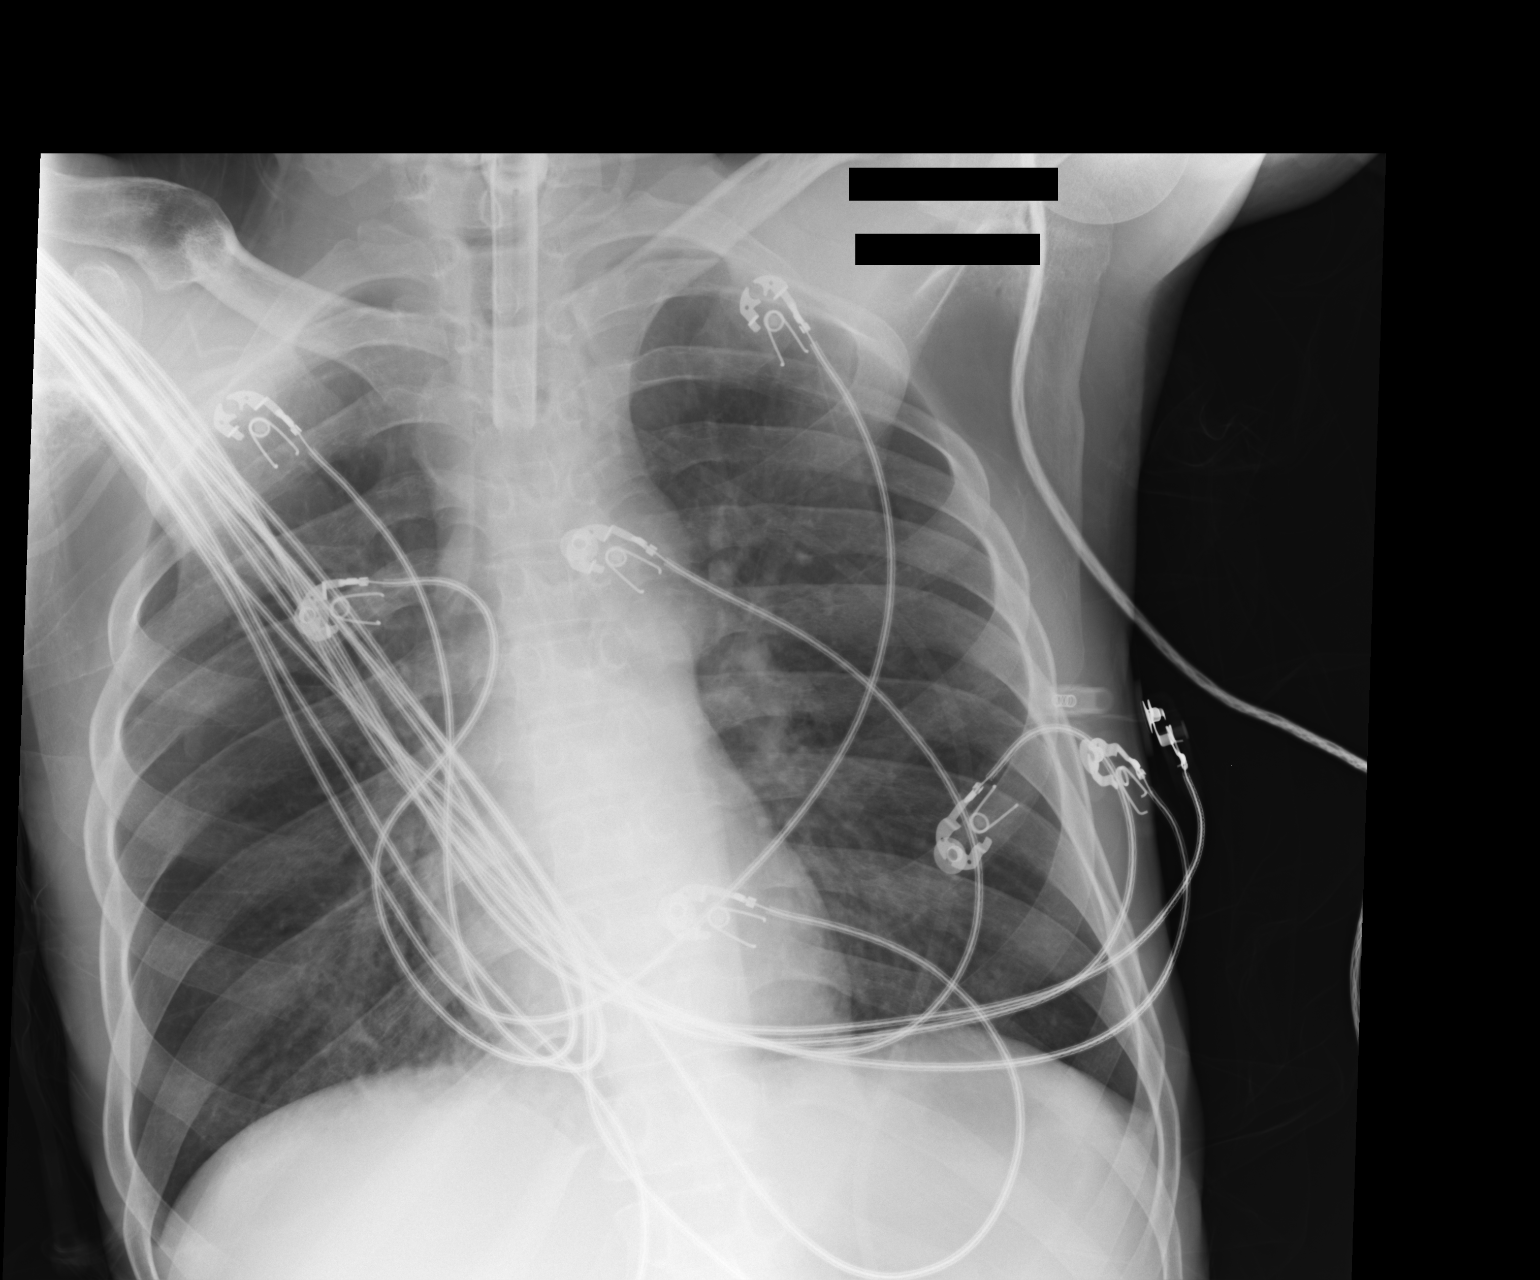

[1 of 1 positions shown; findings below may reference images not displayed]

FINDINGS: There is a tracheostomy tube with the tip 3.5 cm above the carina.
There is no focal parenchymal opacity. There is no pleural effusion
or pneumothorax. The heart and mediastinal contours are
unremarkable.

There is an old healed right clavicular fracture.
IMPRESSION: No active disease.

## 2017-09-18 IMAGING — CT CT HEAD W/O CM
2 series · 16 of 30 positions shown, 20 images · non-contrast
Comparison: 01/06/2015

CLINICAL DATA: Acute encephalopathy, probable sepsis from UTI,
paraplegic secondary to spinal cord injury, asthma, type I diabetes
mellitus, stroke

EXAM:
CT HEAD WITHOUT CONTRAST
TECHNIQUE: Contiguous axial images were obtained from the base of the skull
through the vertex without intravenous contrast.

[Series 201: head w/o, idose (1) · axial · non-contrast · 0.49mm/px · z∈[+796,+926]mm · 13 of 32 slices shown, 17 images]
[im 3/32  brain]
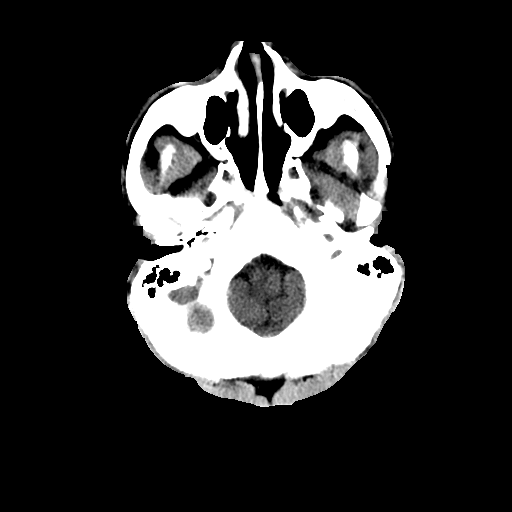
[im 3/32  bone]
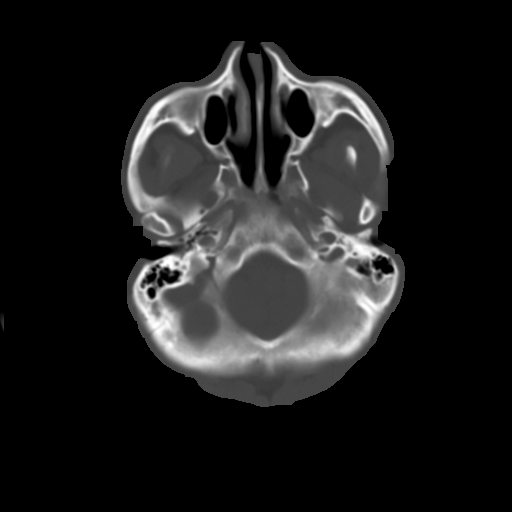
[im 5/32  brain]
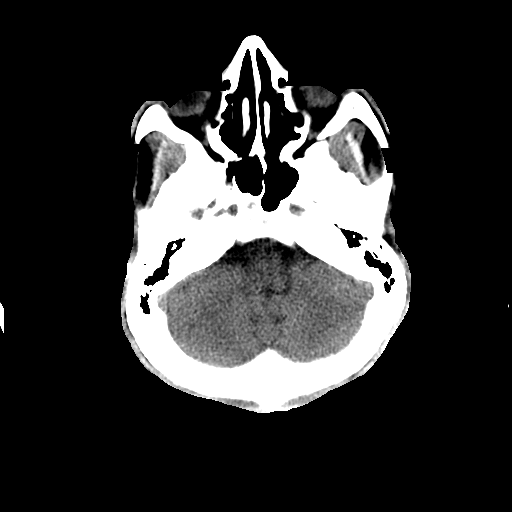
[im 7/32  brain]
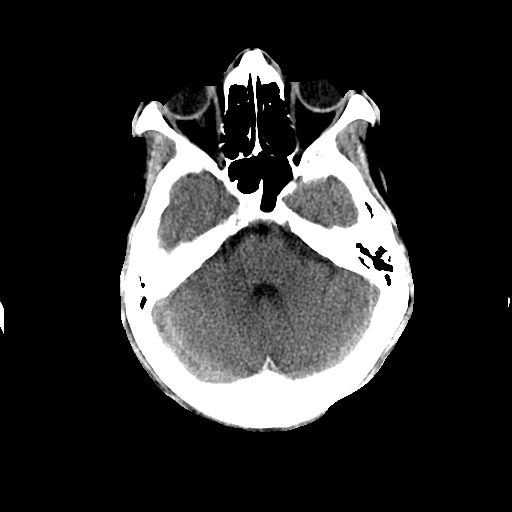
[im 9/32  brain]
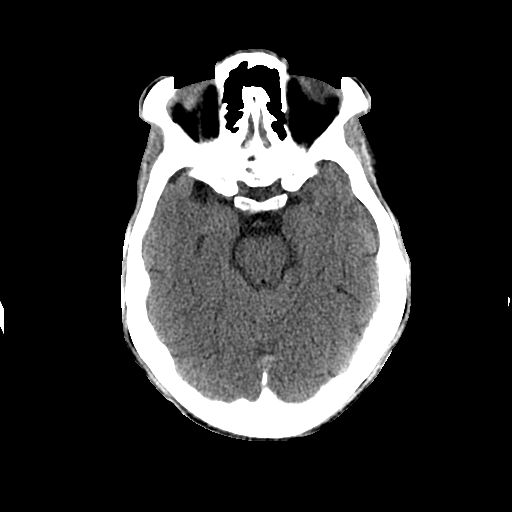
[im 12/32  brain]
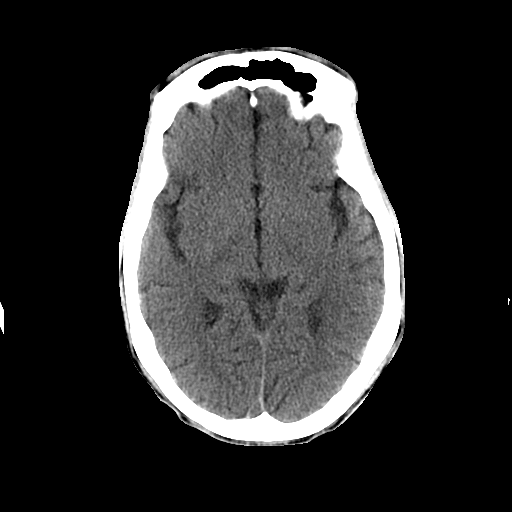
[im 12/32  bone]
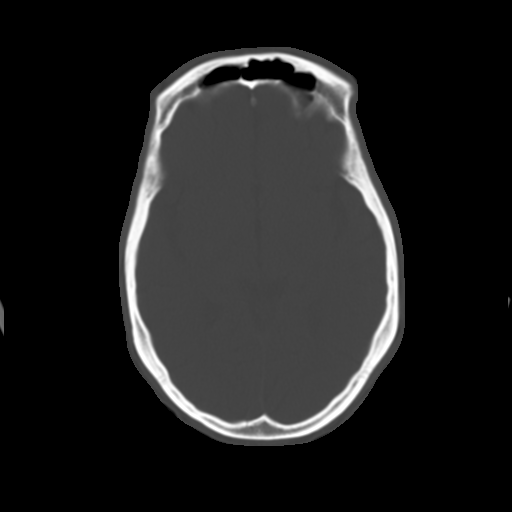
[im 14/32  brain]
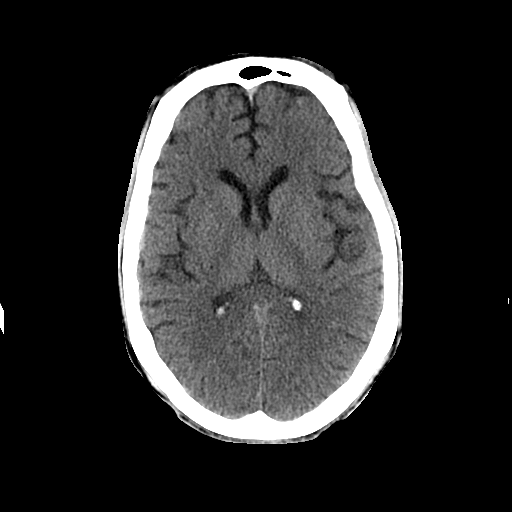
[im 16/32  brain]
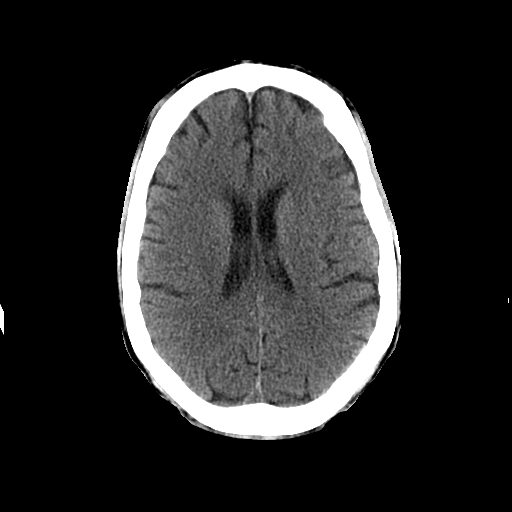
[im 18/32  brain]
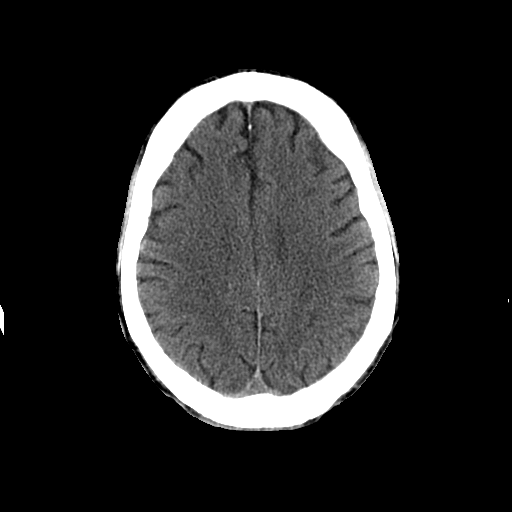
[im 20/32  brain]
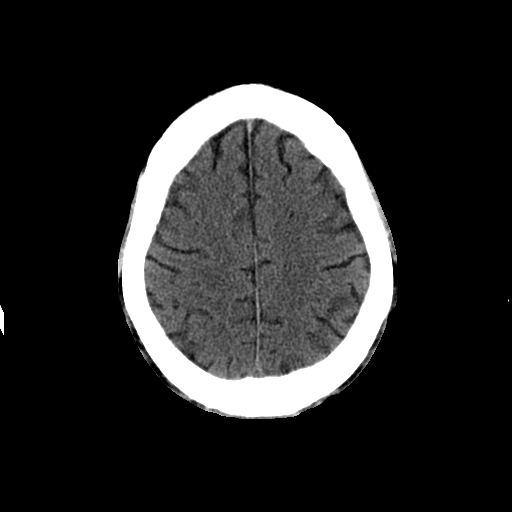
[im 20/32  bone]
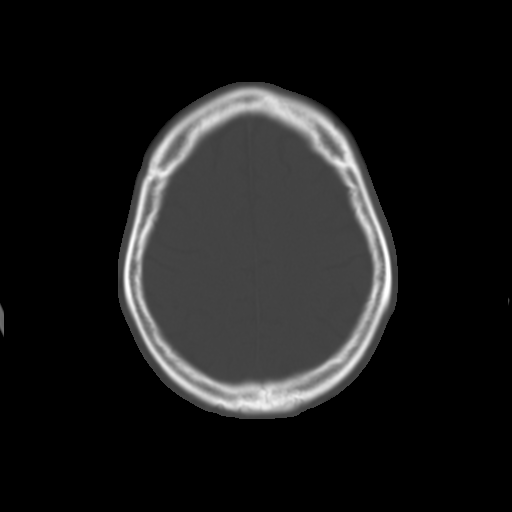
[im 23/32  brain]
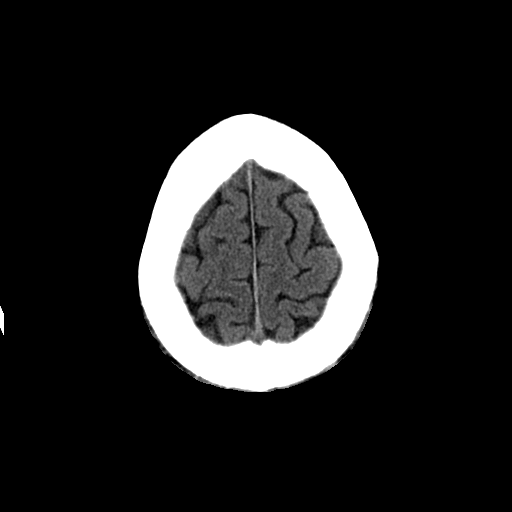
[im 25/32  brain]
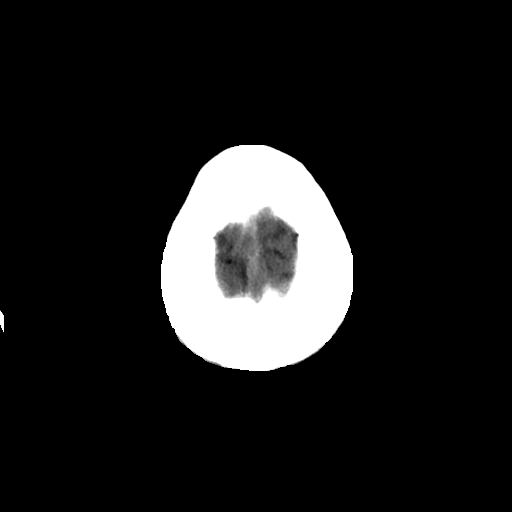
[im 27/32  brain]
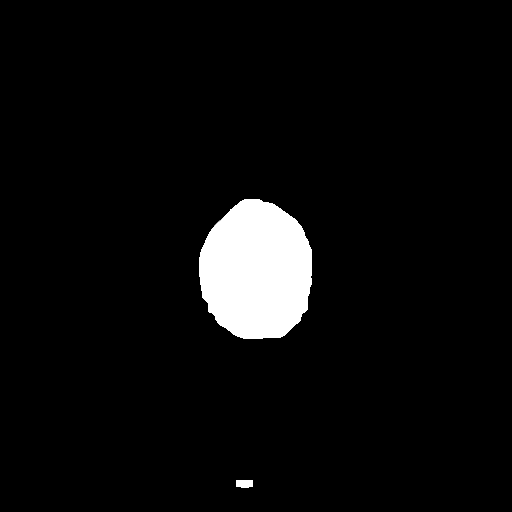
[im 29/32  brain]
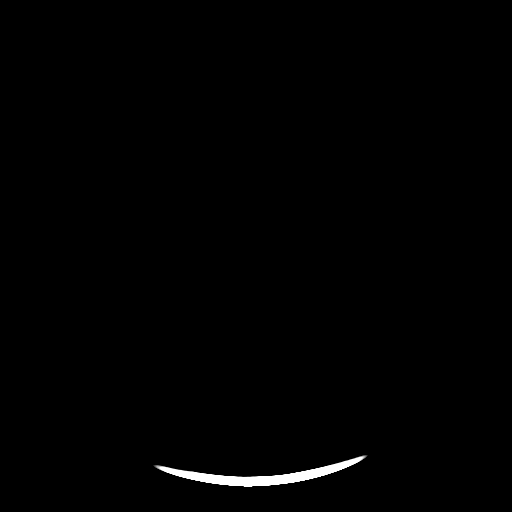
[im 29/32  bone]
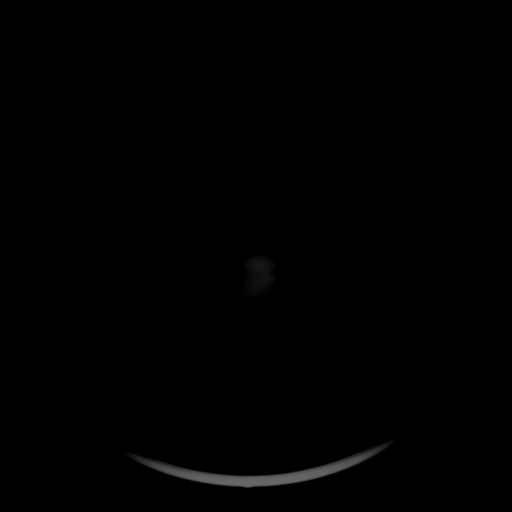

[Series 202: head w/o bone, idose (1) · axial · non-contrast · 0.49mm/px · z∈[+796,+841]mm · 3 of 32 slices shown]
[im 3/32  bone]
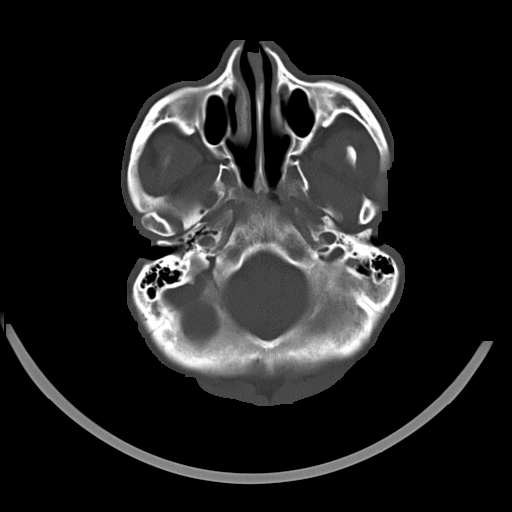
[im 7/32  bone]
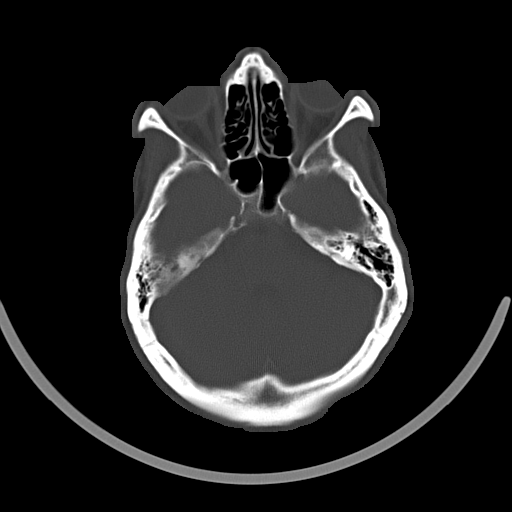
[im 12/32  bone]
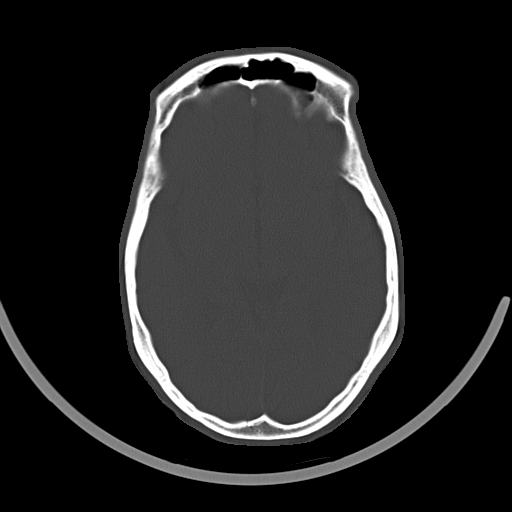

[16 of 30 positions shown; findings below may reference images not displayed]

FINDINGS: Normal ventricular morphology.

No midline shift or mass effect.

Normal appearance of brain parenchyma.

No intracranial hemorrhage, mass lesion, or acute infarction.

Visualized paranasal sinuses and mastoid air cells clear.

Bones unremarkable.
IMPRESSION: No acute intracranial abnormalities.

## 2017-09-18 IMAGING — CT CT ABD-PELV W/O CM
1 of 2 series · 6 of 38 positions shown, 7 images · non-contrast
Comparison: CT scan of pelvis May 02, 2015; CT scan of abdomen
pelvis December 11, 2014.

CLINICAL DATA: Sepsis.

EXAM:
CT ABDOMEN AND PELVIS WITHOUT CONTRAST
TECHNIQUE: Multidetector CT imaging of the abdomen and pelvis was performed
following the standard protocol without IV contrast.

[Series 404: sag · sagittal · 0.45mm/px · 6 of 93 slices shown, 7 images]
[im 11/93  soft-tissue]
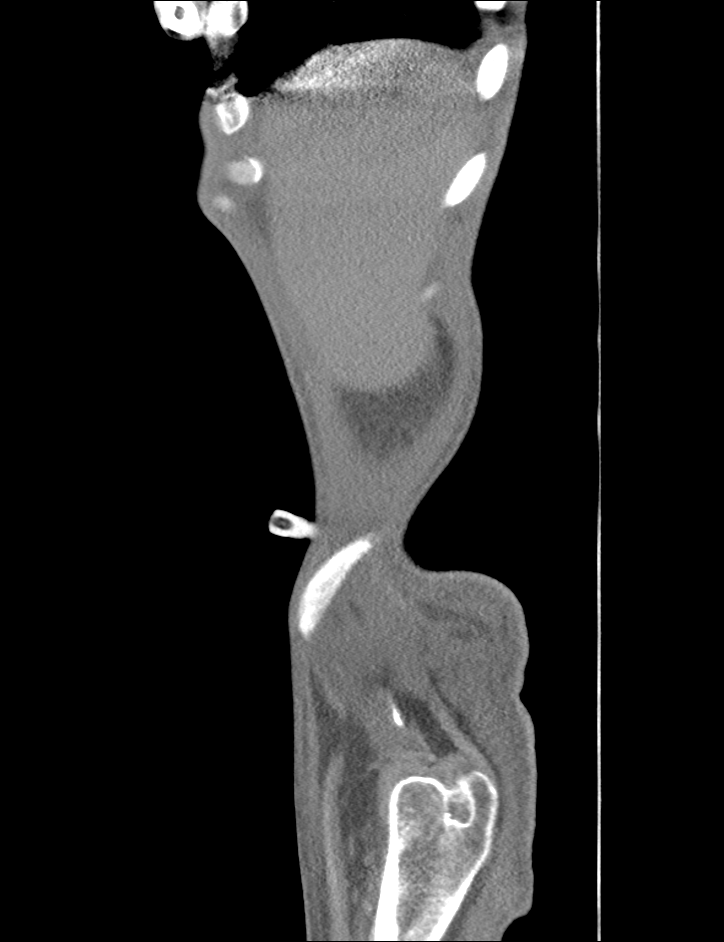
[im 11/93  bone]
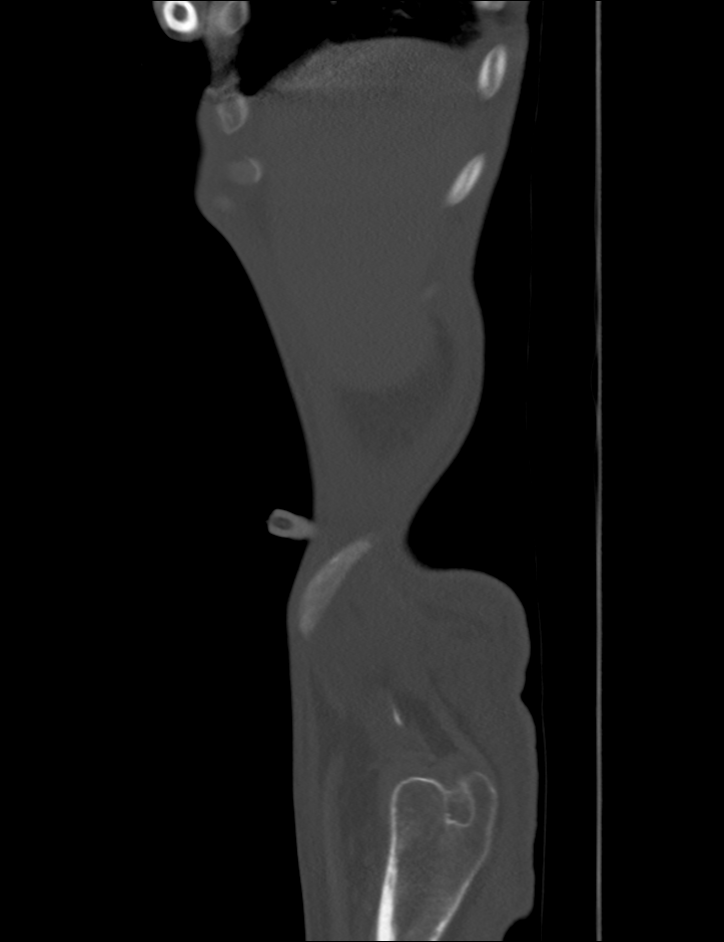
[im 26/93  soft-tissue]
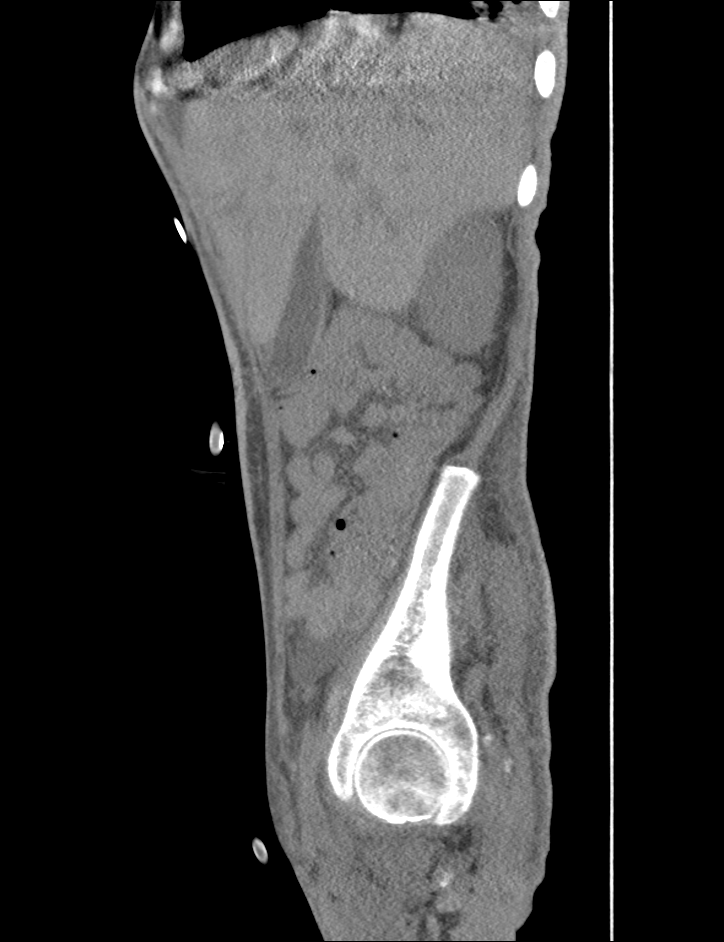
[im 41/93  soft-tissue]
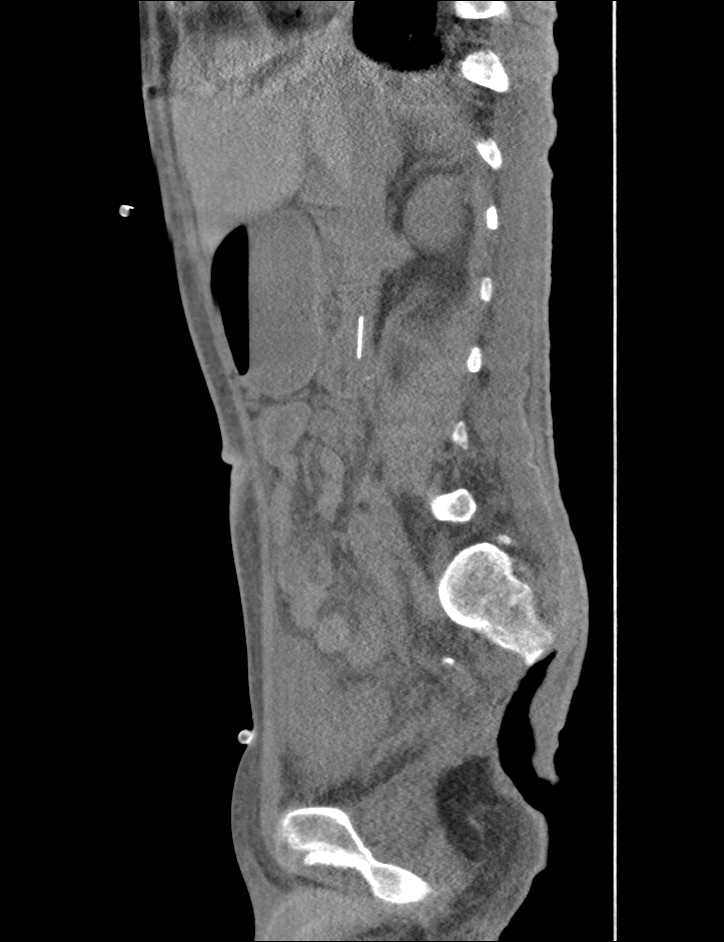
[im 52/93  soft-tissue]
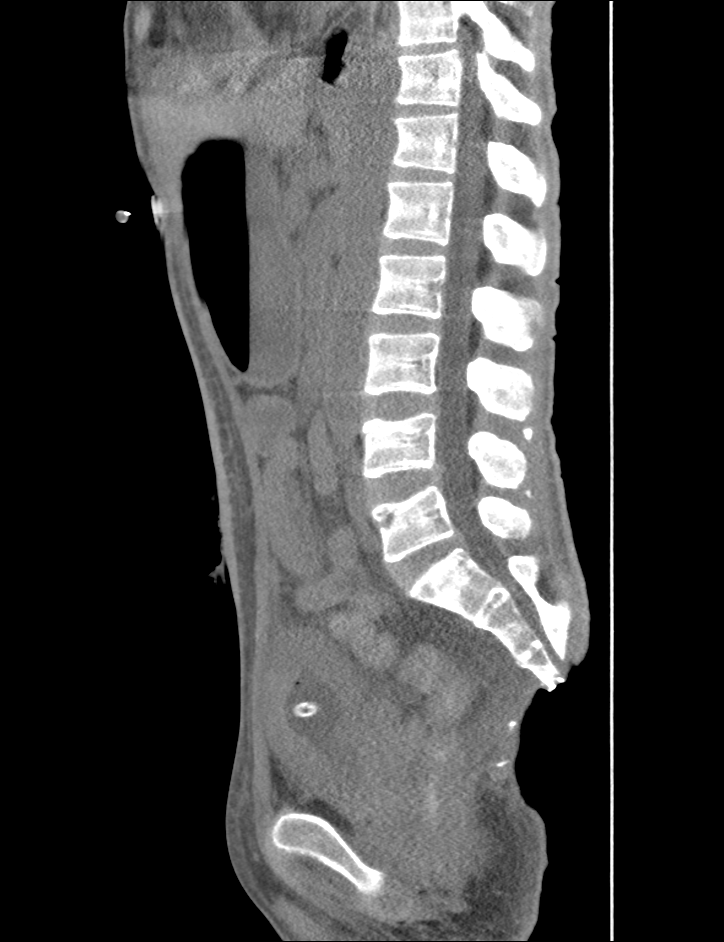
[im 67/93  soft-tissue]
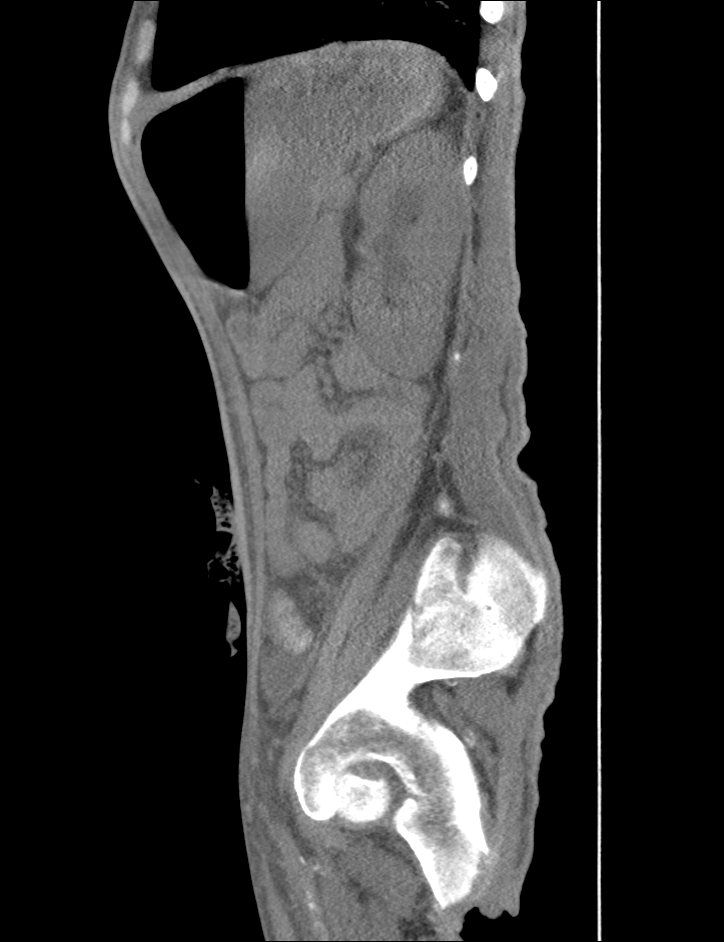
[im 82/93  soft-tissue]
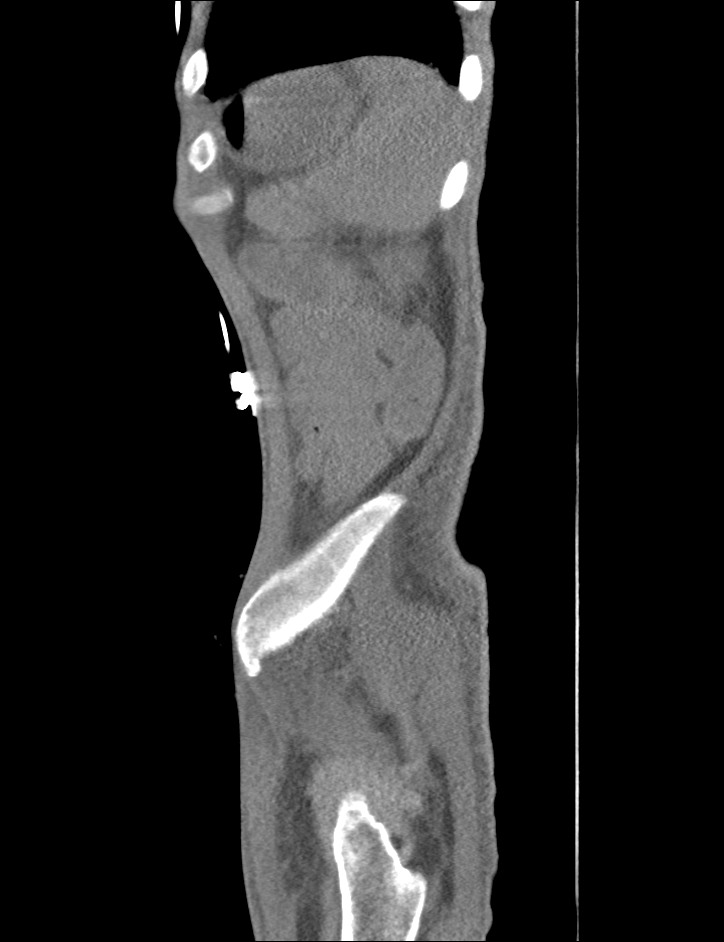

[6 of 38 positions shown; findings below may reference images not displayed]

FINDINGS: Bilateral posterior basilar opacity is are noted, right greater than
left, concerning for possible pneumonia or subsegmental atelectasis.

Gastrostomy tube is in grossly good position. IVC filter is noted in
infrarenal position. No gallstones are noted. No focal abnormality
is noted in the liver, spleen or pancreas on these unenhanced
images. Adrenal glands appear normal. Mild left hydronephrosis is
noted secondary to 3 mm calculus in the proximal left ureter.
Multiple small nonobstructive calculi are noted in right kidney.
There is no evidence of bowel obstruction. Ostomy is noted in left
lower quadrant. Foley catheter is noted within the urinary bladder.
Wall thickening of the urinary bladder is noted which may simply
represent lack of distension, but cystitis cannot be excluded. Mild
anasarca is noted. Minimal free fluid is noted in the dependent
portion of the pelvis. Large sacral decubitus ulcer is noted which
is significantly increased in size. There has been surgical
resection of the distal sacrum and coccyx. Another large sacral
ulceration is seen extending to the right ischial tuberosity which
is unchanged compared to prior exam. No lytic destruction is seen at
this time to suggest acute osteomyelitis.
IMPRESSION: Nonobstructive right nephrolithiasis.

Mild left hydronephrosis is noted secondary to 3 mm calculus seen in
the proximal left ureter.

Bilateral posterior basilar lung opacities are noted, right greater
than left, concerning for pneumonia or possibly atelectasis.

Mild anasarca.

Foley catheter is present within the urinary bladder. Urinary
bladder wall thickening is noted which may be due to lack of
distension, but cystitis cannot be excluded.

Stable decubitus ulcer seen in right-sided pelvis extending to
ischial tuberosity.

Large sacral decubitus ulcer is noted which is increased in size
compared to prior exam. However, no lytic destruction is seen to
suggest active osteomyelitis at this time.

## 2017-09-20 IMAGING — CR DG CHEST 1V PORT
1 series · 1 of 1 positions shown · non-contrast
Comparison: Portable chest x-ray July 10, 2015

CLINICAL DATA: Follow-up of respiratory failure

EXAM:
PORTABLE CHEST 1 VIEW

[AP]
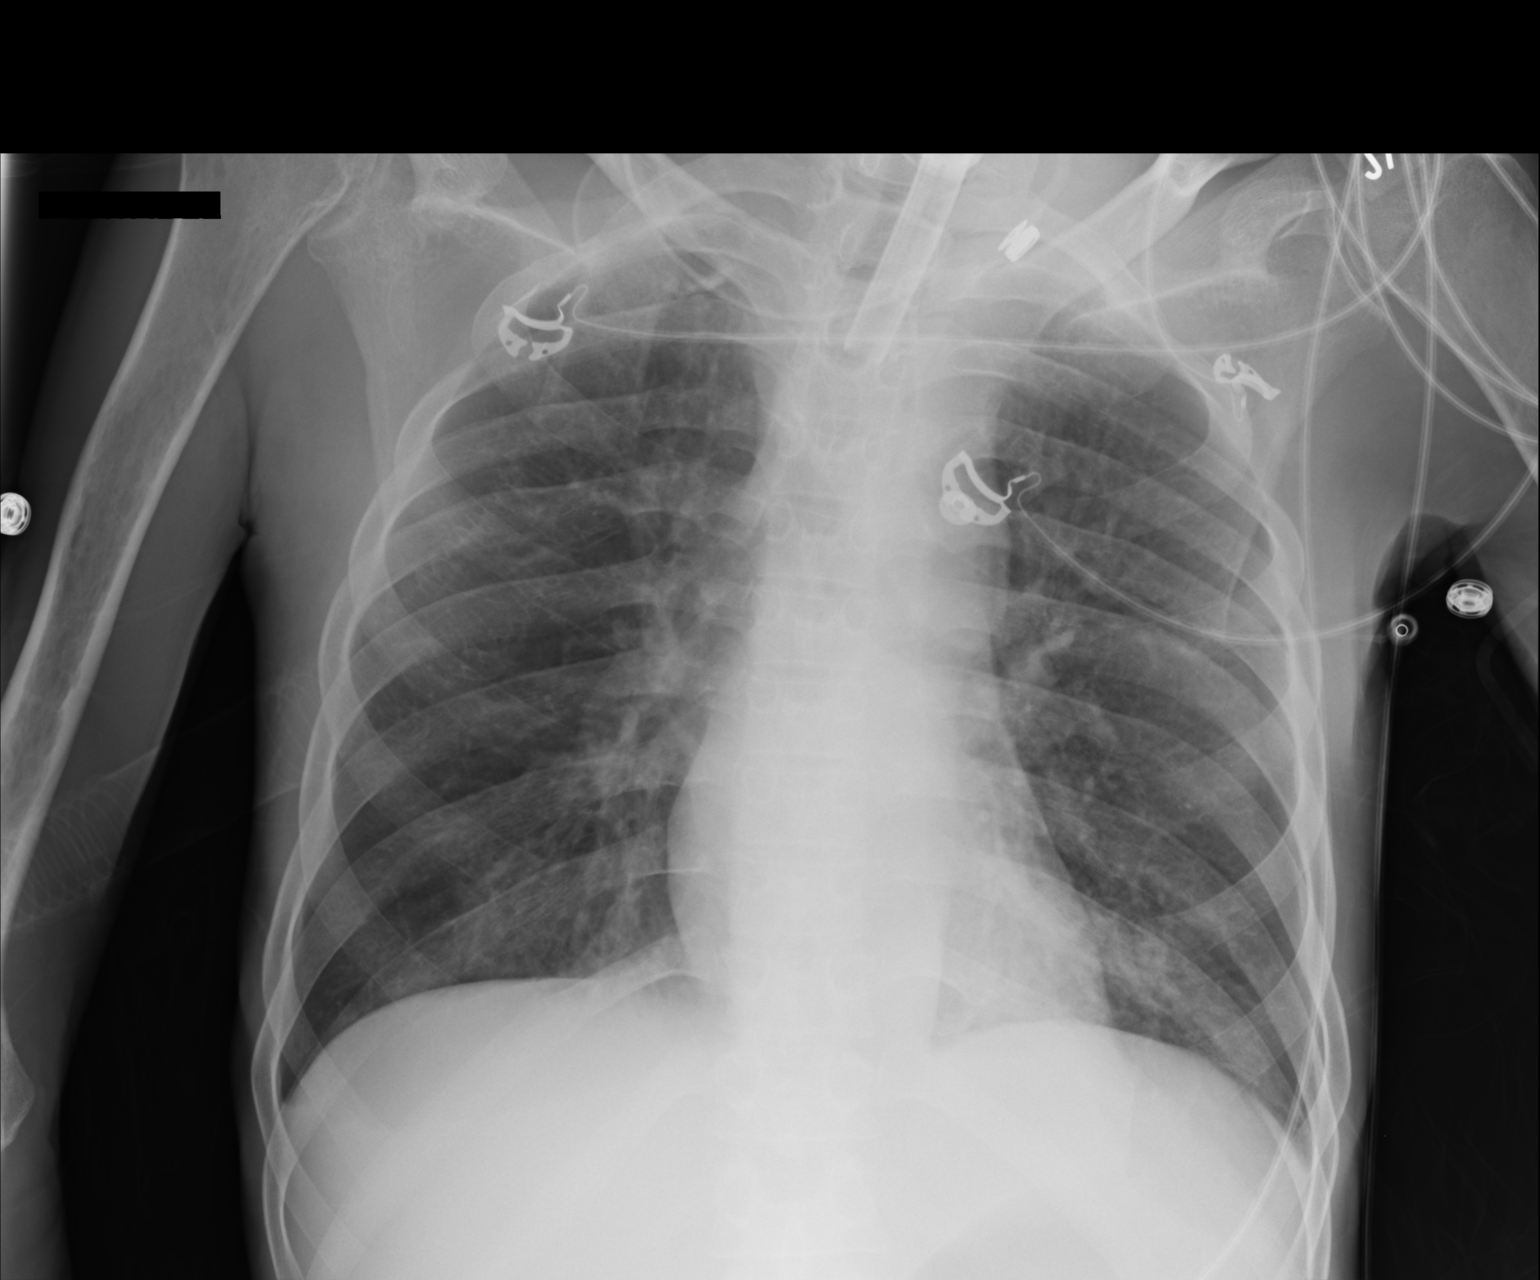

[1 of 1 positions shown; findings below may reference images not displayed]

FINDINGS: The lungs are adequately inflated. The interstitial markings are
slightly more conspicuous today especially in the infrahilar regions
bilaterally greater on the left than on the right. There is no
pleural effusion or pneumothorax. The heart and pulmonary
vascularity are normal. The tracheostomy appliance tip projects just
inferior to the inferior margins of the clavicular heads.
IMPRESSION: Interval development of subsegmental atelectasis in the infrahilar
regions bilaterally. There is no alveolar infiltrate nor evidence of
pulmonary edema.

## 2017-09-22 IMAGING — RF DG SWALLOWING FUNCTION - NRPT [facility]
1 series · 1 of 1 positions shown · non-contrast
Comparison: none

[Series 1: run · 1 of 1 slices shown]
[im 1/1]
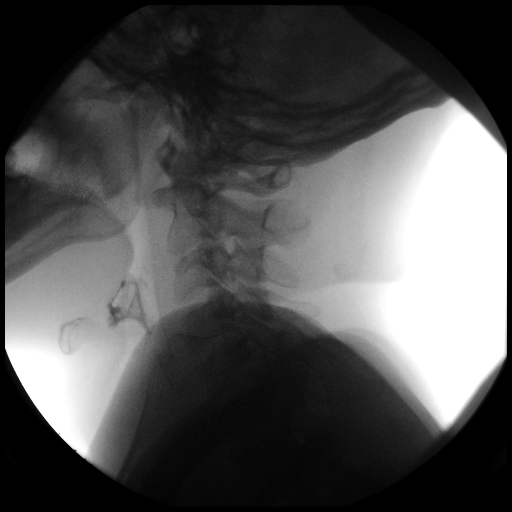

[1 of 1 positions shown; findings below may reference images not displayed]

FLUOROSCOPY FOR SWALLOWING FUNCTION STUDY:
Fluoroscopy was provided for swallowing function study, which was administered by a speech pathologist.  Final results and recommendations from this study are contained within the speech pathology report.

## 2017-10-02 IMAGING — CT CT ABD-PELV W/ CM
2 of 4 series · 15 of 46 positions shown, 17 images · IV contrast (omnipaque)
Comparison: 07/11/2015

CLINICAL DATA: GERD fever of unknown origin, recent MRSA infection,
strep bacteremia, respiratory failure, admitted with hallucinations
and confusion, question UTI ; history spinal cord injury and
paraplegia, type I diabetes mellitus

EXAM:
CT ABDOMEN AND PELVIS WITH CONTRAST
TECHNIQUE: Multidetector CT imaging of the abdomen and pelvis was performed
using the standard protocol following bolus administration of
intravenous contrast. Sagittal and coronal MPR images reconstructed
from axial data set.
CONTRAST:  100mL OMNIPAQUE IOHEXOL 300 MG/ML SOLN IV. Dilute oral
contrast.

[Series 2: a/p w/ 5mm · axial · 0.59mm/px · z∈[-481,-91]mm · 12 of 94 slices shown, 14 images]
[im 8/94  soft-tissue]
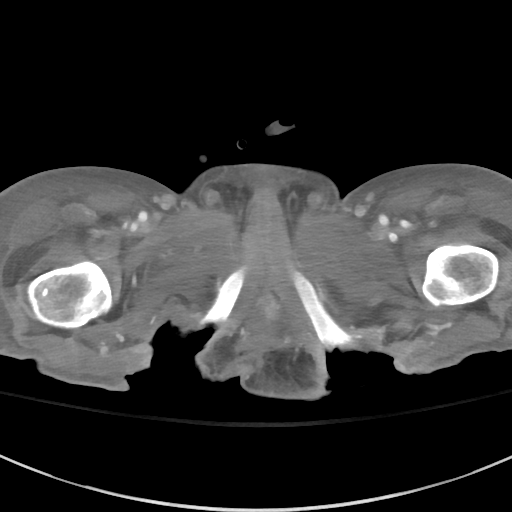
[im 8/94  bone]
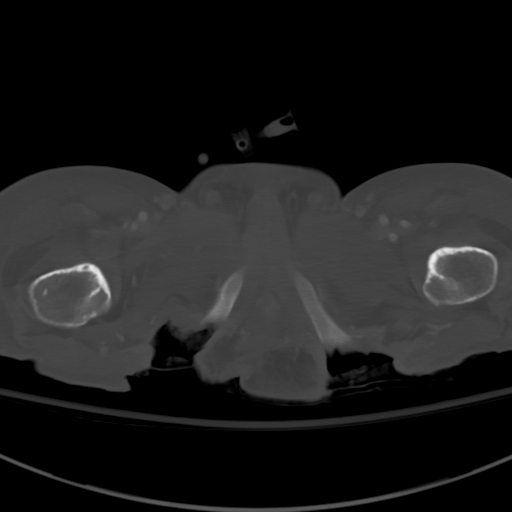
[im 15/94  soft-tissue]
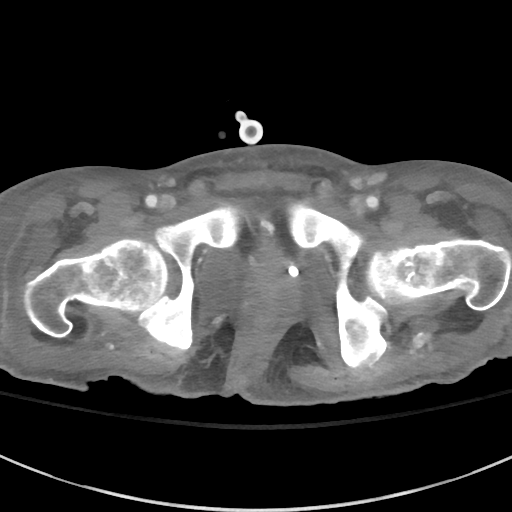
[im 22/94  soft-tissue]
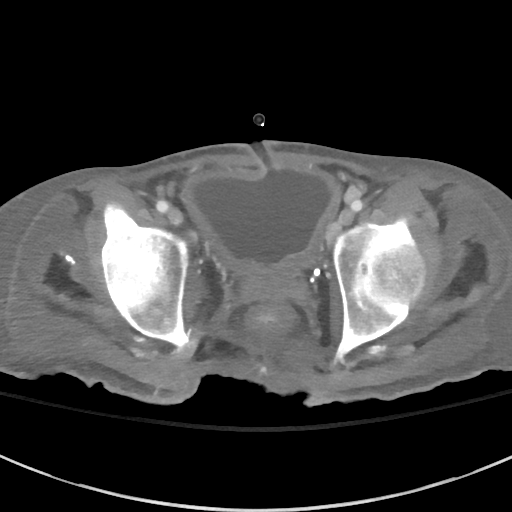
[im 29/94  soft-tissue]
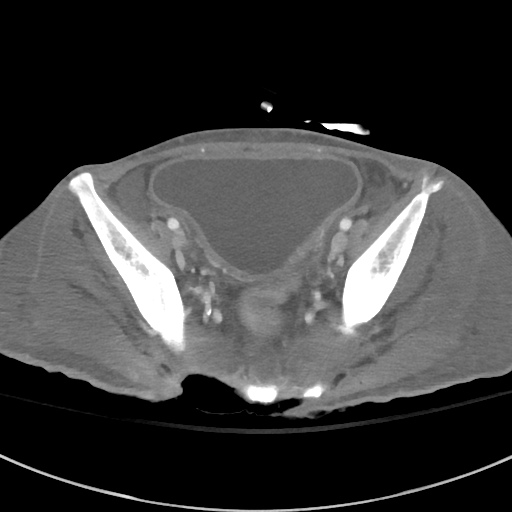
[im 36/94  soft-tissue]
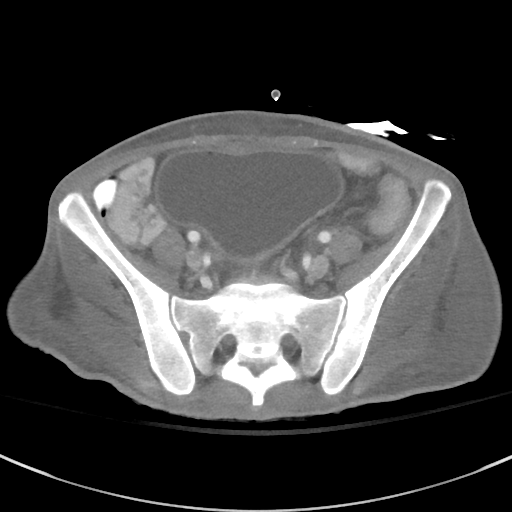
[im 43/94  soft-tissue]
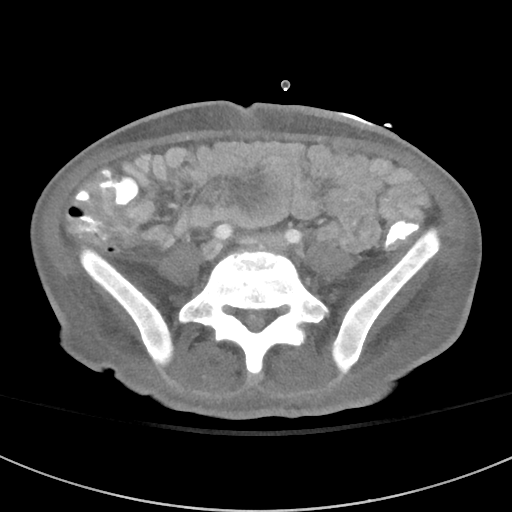
[im 51/94  soft-tissue]
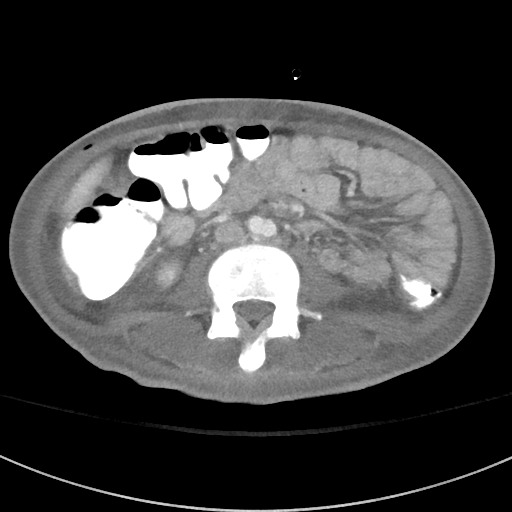
[im 58/94  soft-tissue]
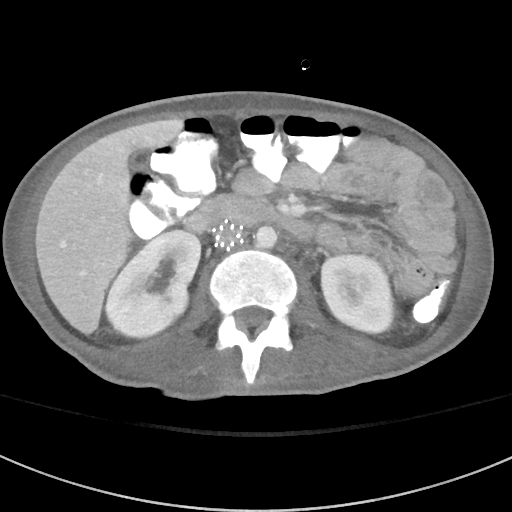
[im 65/94  soft-tissue]
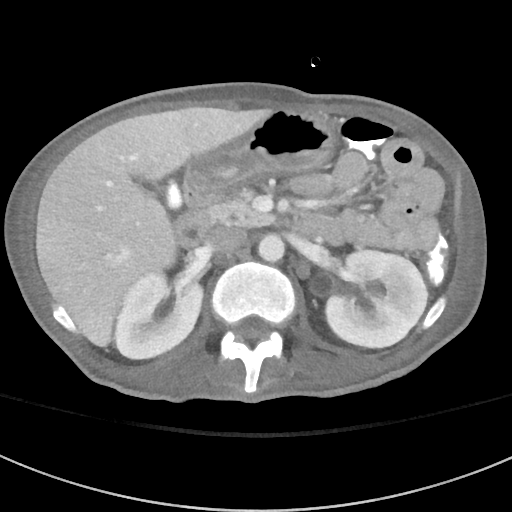
[im 65/94  bone]
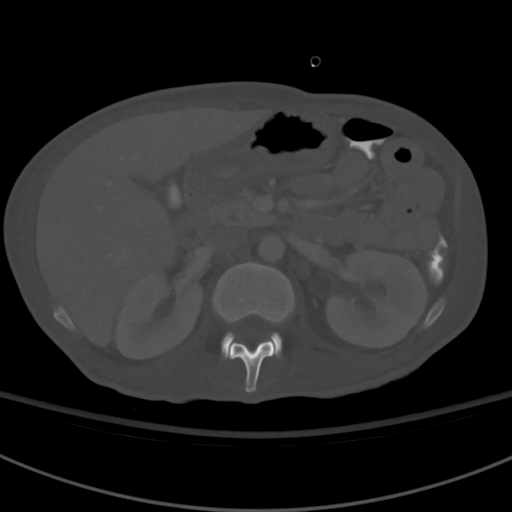
[im 72/94  soft-tissue]
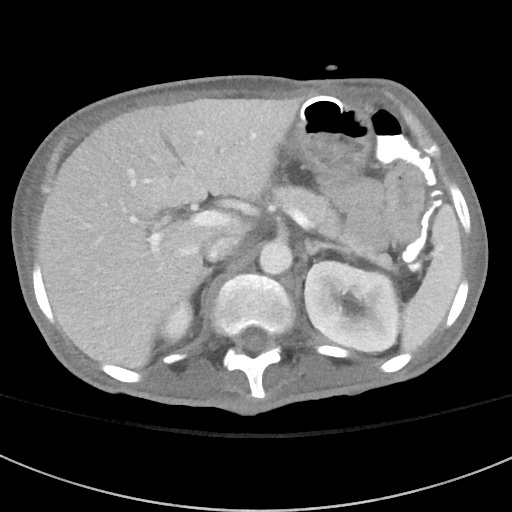
[im 79/94  soft-tissue]
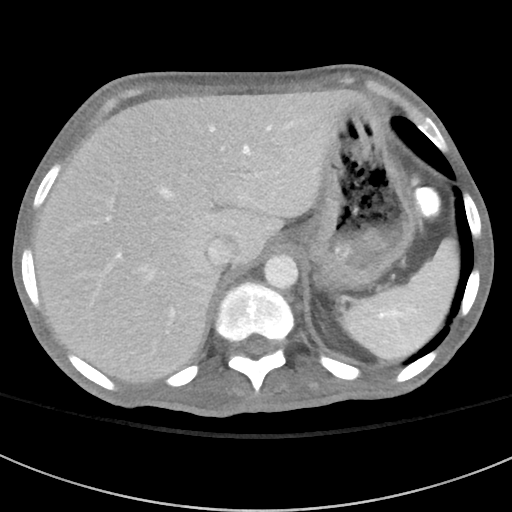
[im 86/94  soft-tissue]
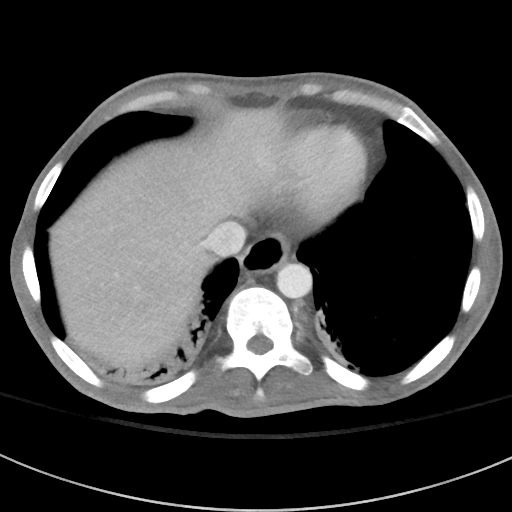

[Series 5: a/p w/ cor · coronal · 0.73mm/px · 3 of 99 slices shown]
[im 33/99  soft-tissue]
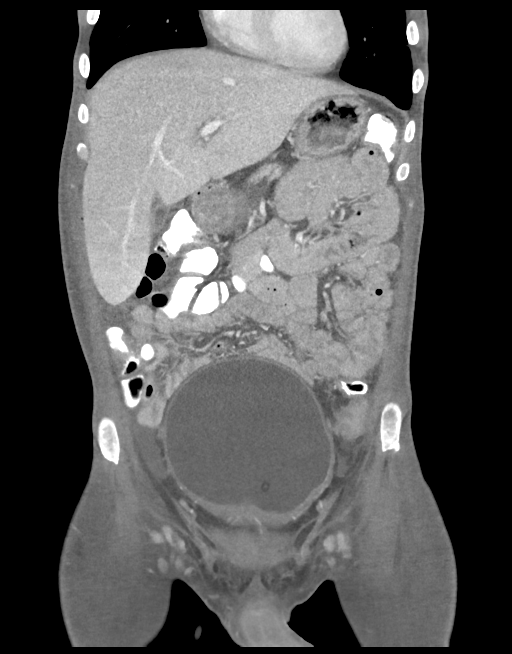
[im 44/99  soft-tissue]
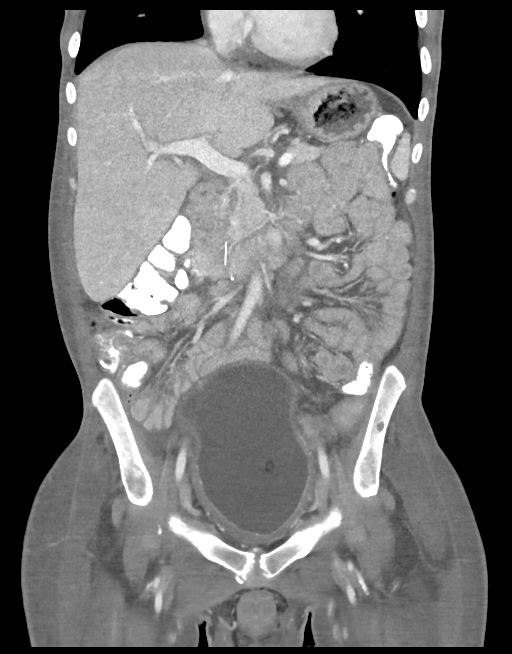
[im 55/99  soft-tissue]
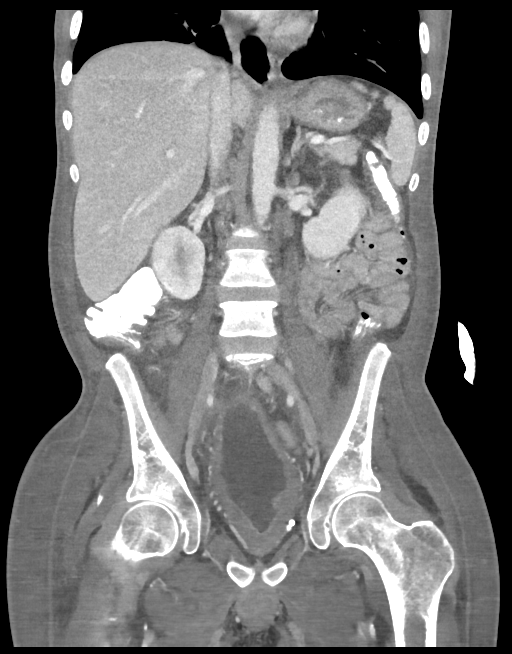

[15 of 46 positions shown; findings below may reference images not displayed]

FINDINGS: Infiltrate RIGHT lower lobe.

Mild atelectasis LEFT lower lobe.

Peribronchial thickening in both lower lobes with RIGHT lower lobe
bronchiectasis.

Contracted gallbladder.

Scarring at medial aspect of inferior pole RIGHT kidney unchanged.

Liver, spleen, pancreas, kidneys, and adrenal glands otherwise
unremarkable.

IVC filter and gastrostomy tube.

Normal appendix.

Question bowel wall thickening involving the cecum and ascending
colon versus artifact from underdistention.

LEFT lower quadrant descending colostomy.

Stomach and remaining bowel loops unremarkable.

Bladder wall thickening, question related to chronic suprapubic
catheterization in urinary tract infection versus chronic outlet
obstruction.

LEFT ureter distended though this could be related to bladder
distention.

Vascular structures grossly patent.

Large sacral decubitus ulcer with prior distal sacrococcygeal
destruction or resection.

Foci of soft tissue gas within LEFT gluteus maximus question
penetration by decubitus ulcer and infection.

Diffuse soft tissue edema.

Additional foci of nonspecific soft tissue gas RIGHT lateral
abdomen.

No mass, adenopathy, free air or free fluid.

Bones demineralized.
IMPRESSION: Questionable wall thickening of the cecum and ascending colon versus
artifact from underdistention, unable to exclude colitis.

Diffuse bladder wall thickening which may be related to chronic
catheterization/infection or outlet obstruction.

Diffuse soft tissue edema which can be seen with anasarca and
hypoproteinemia.

Peribronchial thickening in both lower lobes with RIGHT lower lobe
bronchiectasis and RIGHT lower lobe infiltrate.

Sacral decubitus ulcer with prior distal sacrococcygeal destruction
or resection and foci of gas within the LEFT gluteus maximus
question infection.

Additional nonspecific foci of soft tissue gas in the RIGHT lateral
abdominal wall soft tissue planes.

## 2017-10-04 IMAGING — CR DG CHEST 1V PORT
1 series · 1 of 1 positions shown · non-contrast
Comparison: 07/16/2015

CLINICAL DATA: Tracheostomy and fever

EXAM:
PORTABLE CHEST 1 VIEW

[AP]
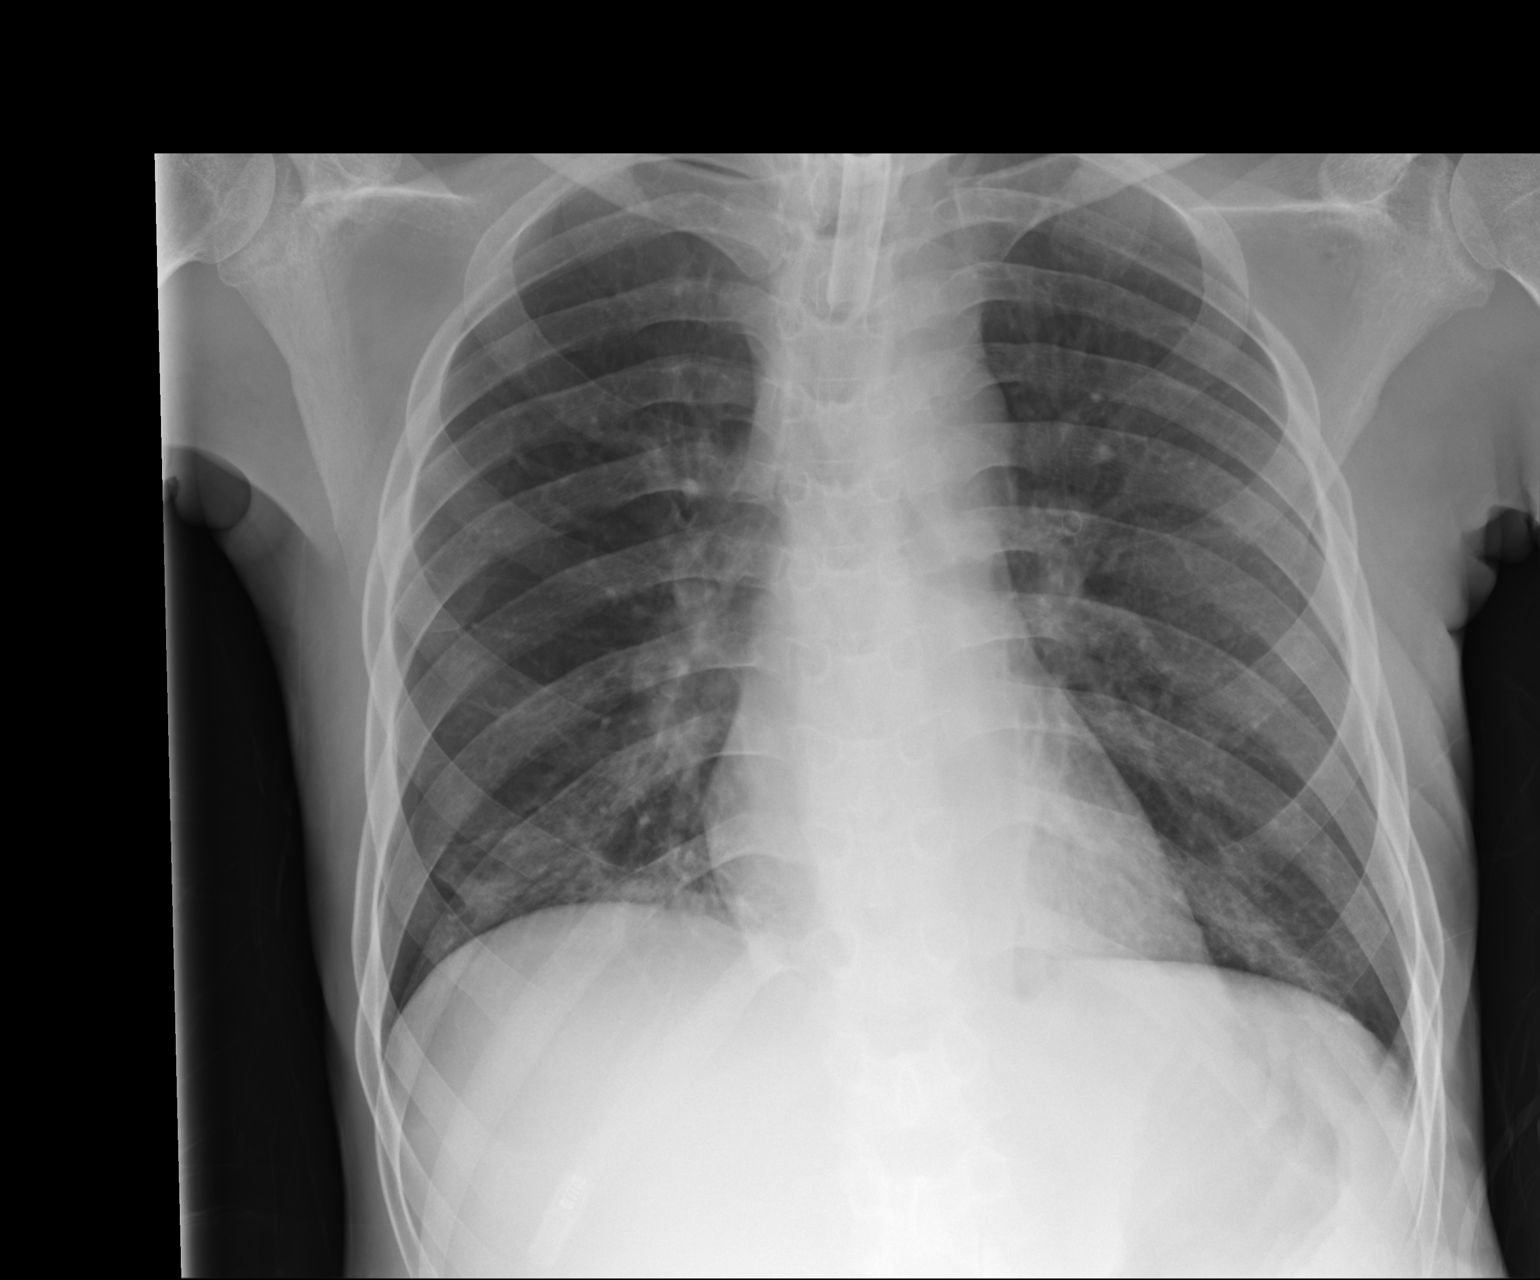

[1 of 1 positions shown; findings below may reference images not displayed]

FINDINGS: Tracheostomy tube in unchanged position over the tracheal air
column. Heart size and vascular pattern are normal. There are mild
bilateral lower lobe infiltrates. When compared to the prior study
there both significantly improved. No pleural effusion. Stable mild
expansile deformity distal right clavicle possibly due to prior
trauma.
IMPRESSION: Persistent but improved bilateral lower lobe infiltrates.

## 2017-11-02 IMAGING — CR DG CHEST 1V PORT
1 series · 1 of 1 positions shown · non-contrast
Comparison: July 27, 2015.

CLINICAL DATA: Fever.

EXAM:
PORTABLE CHEST 1 VIEW

[AP]
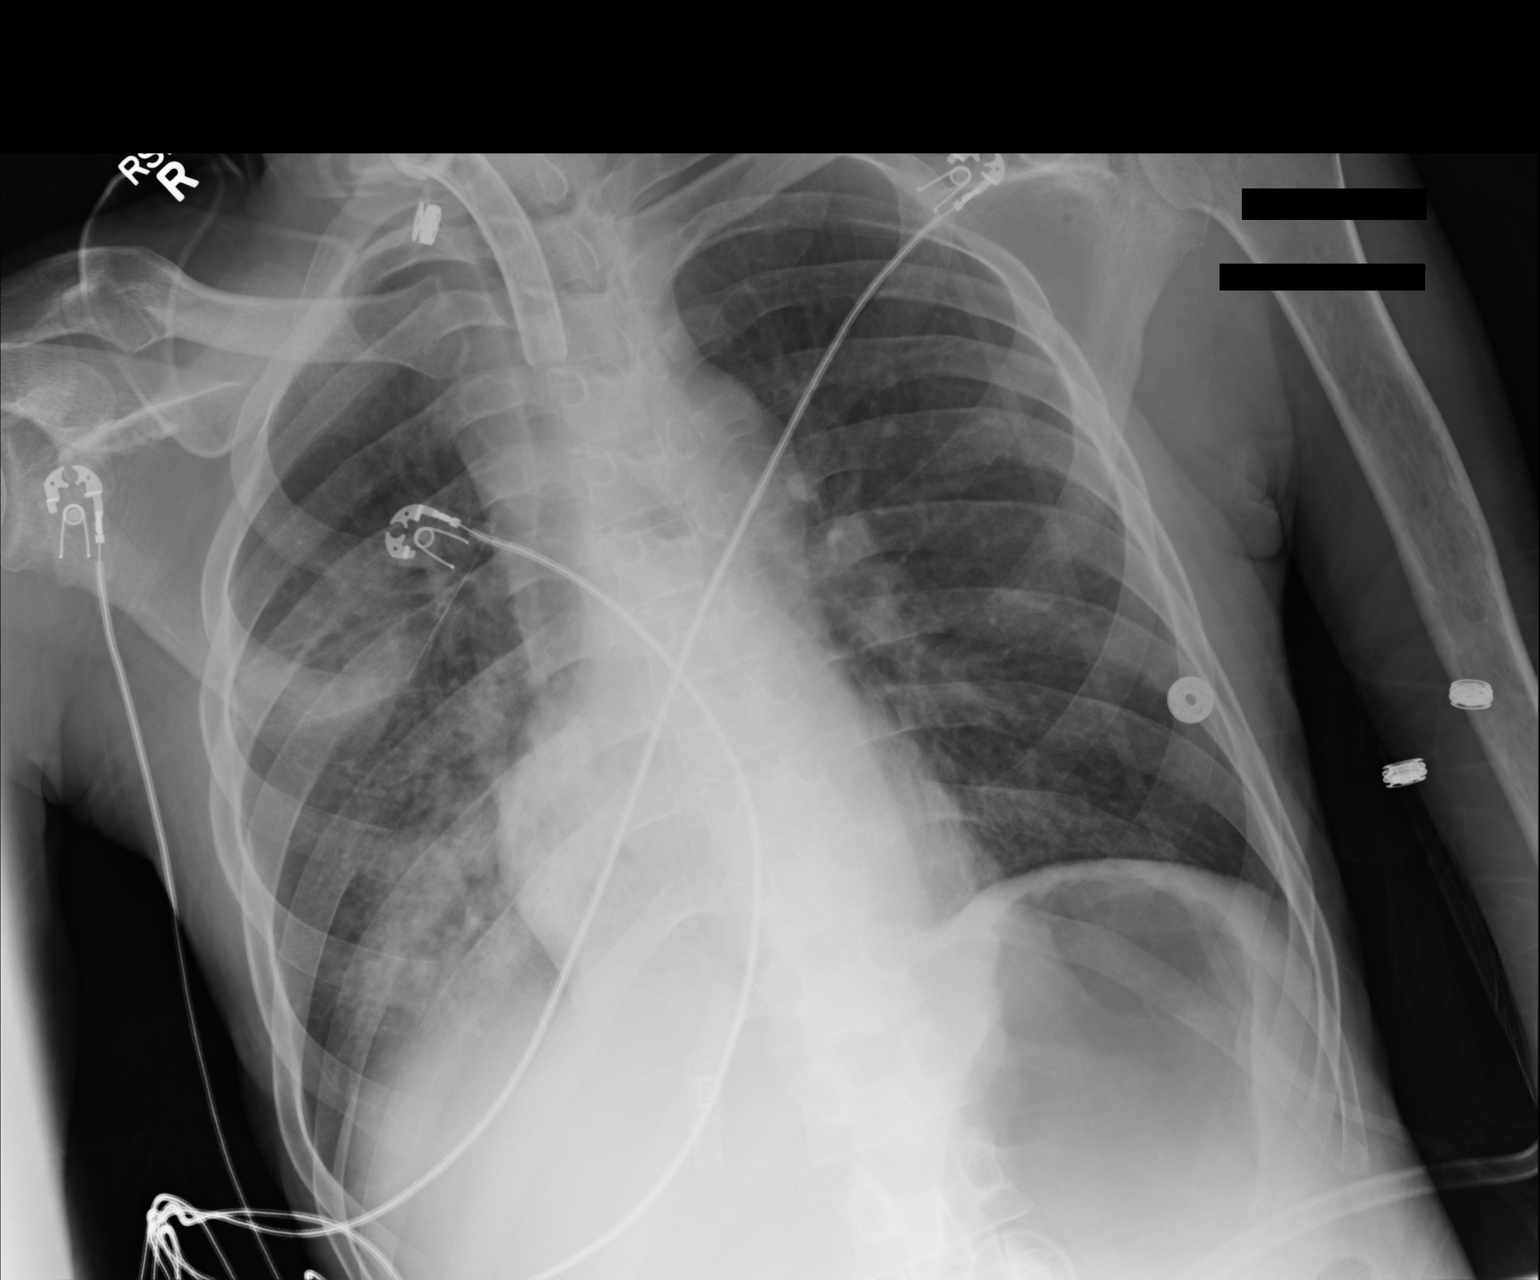

[1 of 1 positions shown; findings below may reference images not displayed]

FINDINGS: Tracheostomy is in grossly good position. Cardiomediastinal
silhouette appears normal. No pneumothorax or significant pleural
effusion is noted. Left lung is clear. Right midlung and basilar
opacity is noted concerning for pneumonia. Bony thorax is
unremarkable.
IMPRESSION: Right-sided pneumonia is noted.  Tracheostomy is in good position.

## 2017-11-03 IMAGING — CR DG CHEST 1V PORT
1 series · 1 of 1 positions shown · non-contrast
Comparison: Single view of the chest 08/25/2015.

CLINICAL DATA: Status post right PICC placement today.

EXAM:
PORTABLE CHEST 1 VIEW

[AP]
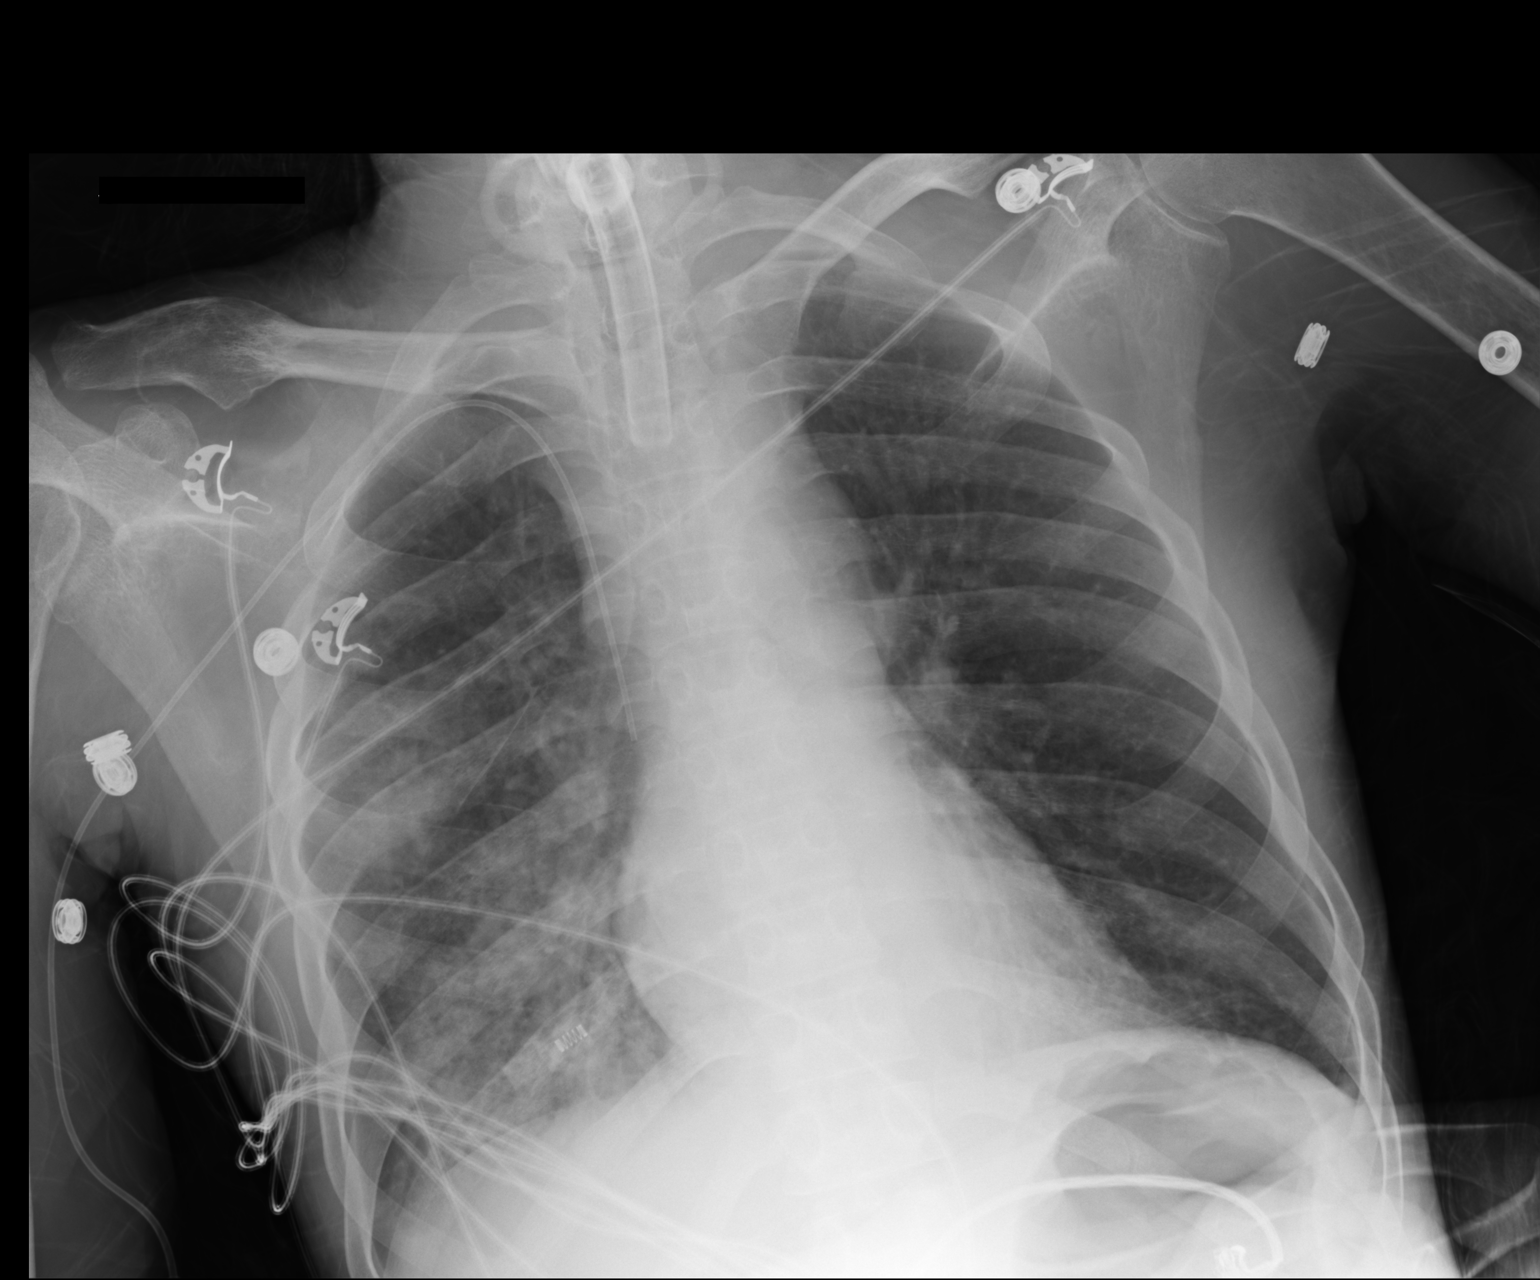

[1 of 1 positions shown; findings below may reference images not displayed]

FINDINGS: Right PICC is in place with the tip projecting over the lower
superior vena cava. Tracheostomy tube is again seen. Airspace
disease in the right mid and lower lung zones persists. Aeration in
the right base appears slightly improved. The left lung is clear.
Heart size is normal. No pneumothorax.
IMPRESSION: Tip of right PICC projects over the lower superior vena cava.

Persistent airspace disease in the right shows mild improvement in
the lung base.

## 2017-12-07 IMAGING — CR DG SHOULDER 2+V*R*
2 series · 2 of 2 positions shown · non-contrast
Comparison: None.

CLINICAL DATA: Right shoulder pain for 2 weeks.

EXAM:
RIGHT SHOULDER - 2+ VIEW

[view not recorded (1 of 2)]
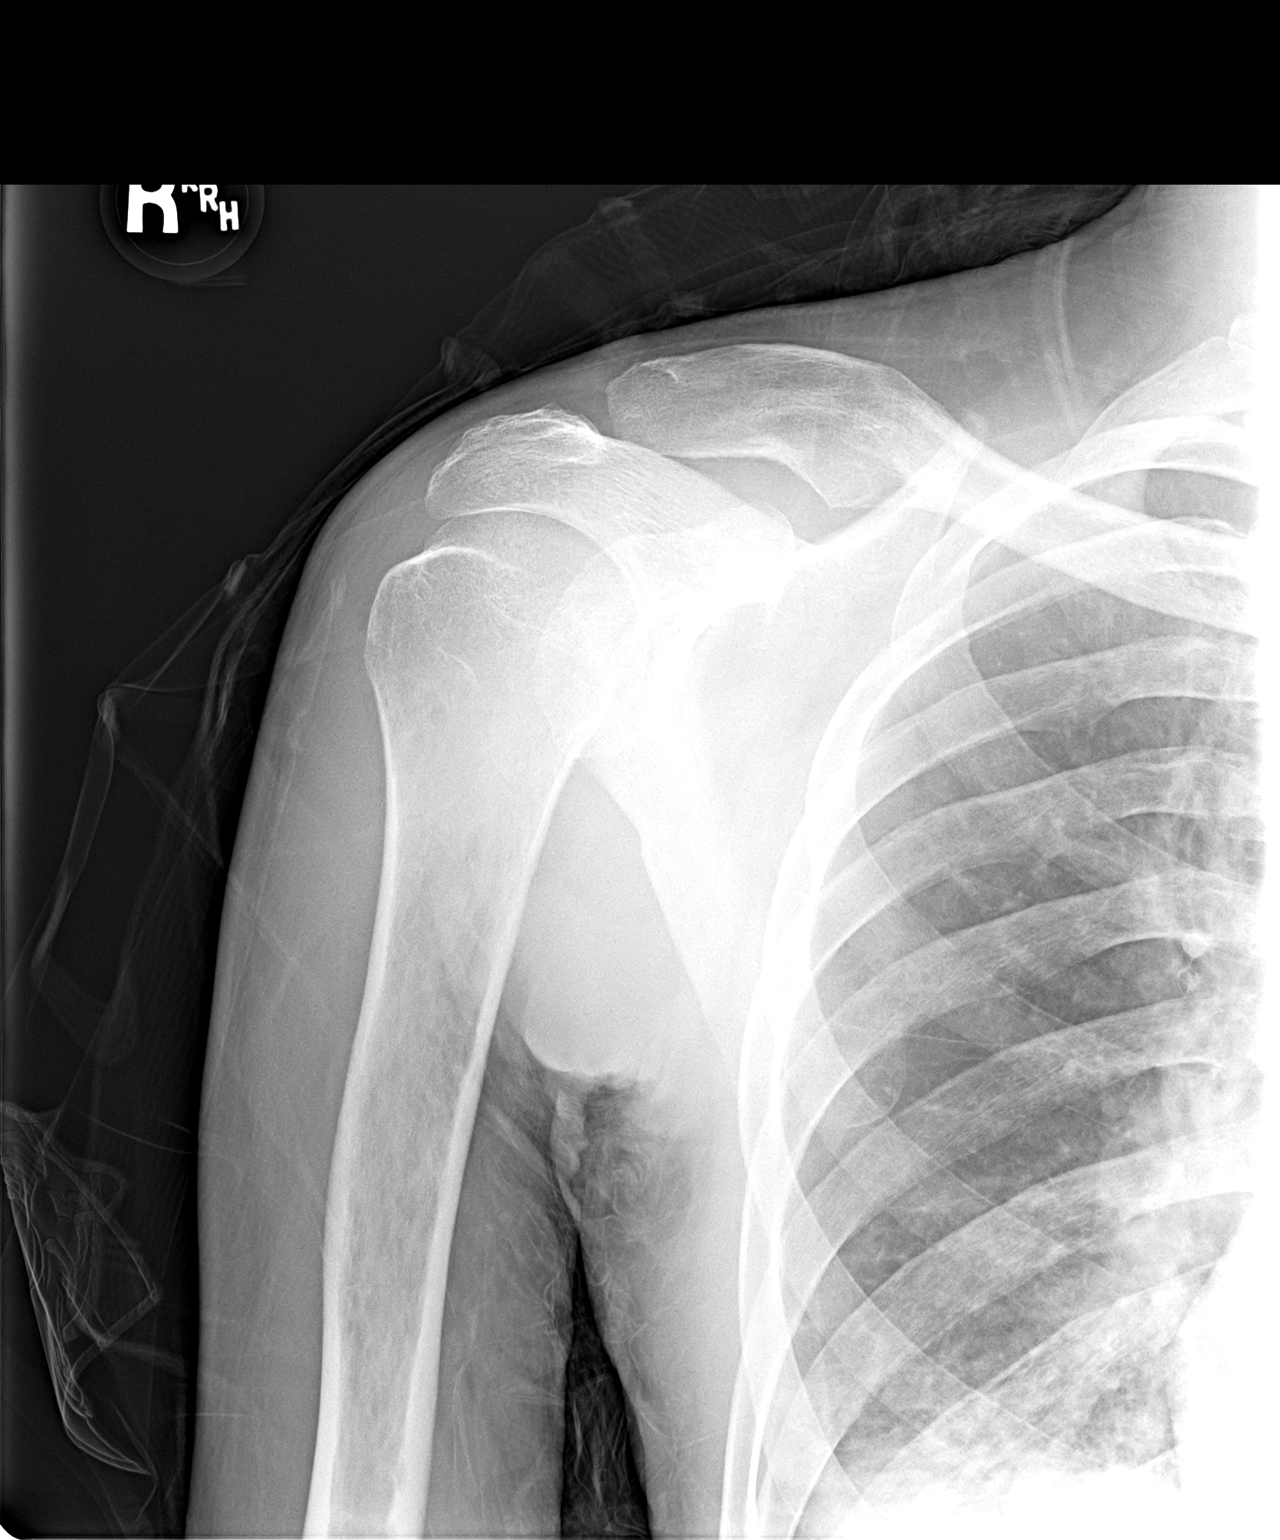

[view not recorded (2 of 2)]
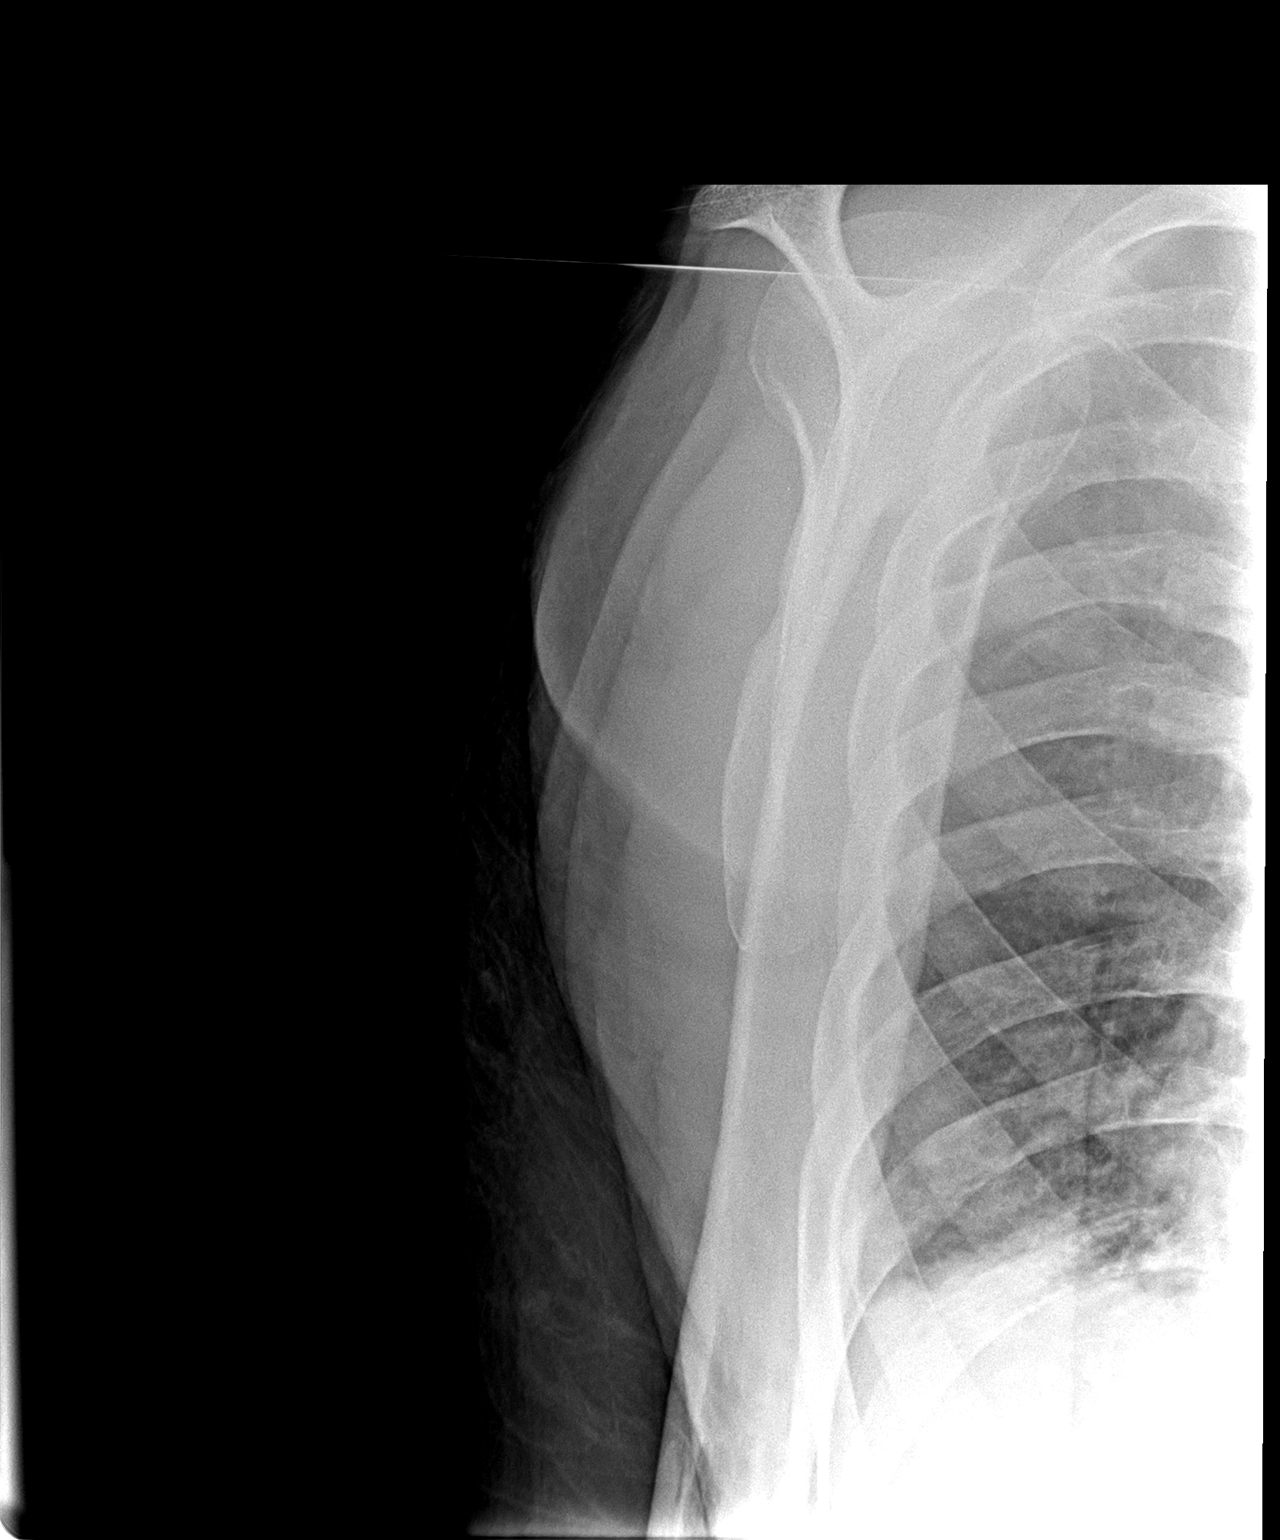

[2 of 2 positions shown; findings below may reference images not displayed]

FINDINGS: No acute bony abnormality. Specifically, no fracture, subluxation,
or dislocation. Soft tissues are intact.
IMPRESSION: Negative.

## 2018-01-10 IMAGING — CR DG CHEST 1V
1 series · 1 of 1 positions shown · non-contrast
Comparison: Portable chest x-ray August 26, 2015 and July 13, 2015.

CLINICAL DATA: Follow-up of community acquired pneumonia, no
current chest complaints. The patient is quadriplegic and has a
tracheostomy appliance.

EXAM:
CHEST 1 VIEW

[view not recorded]
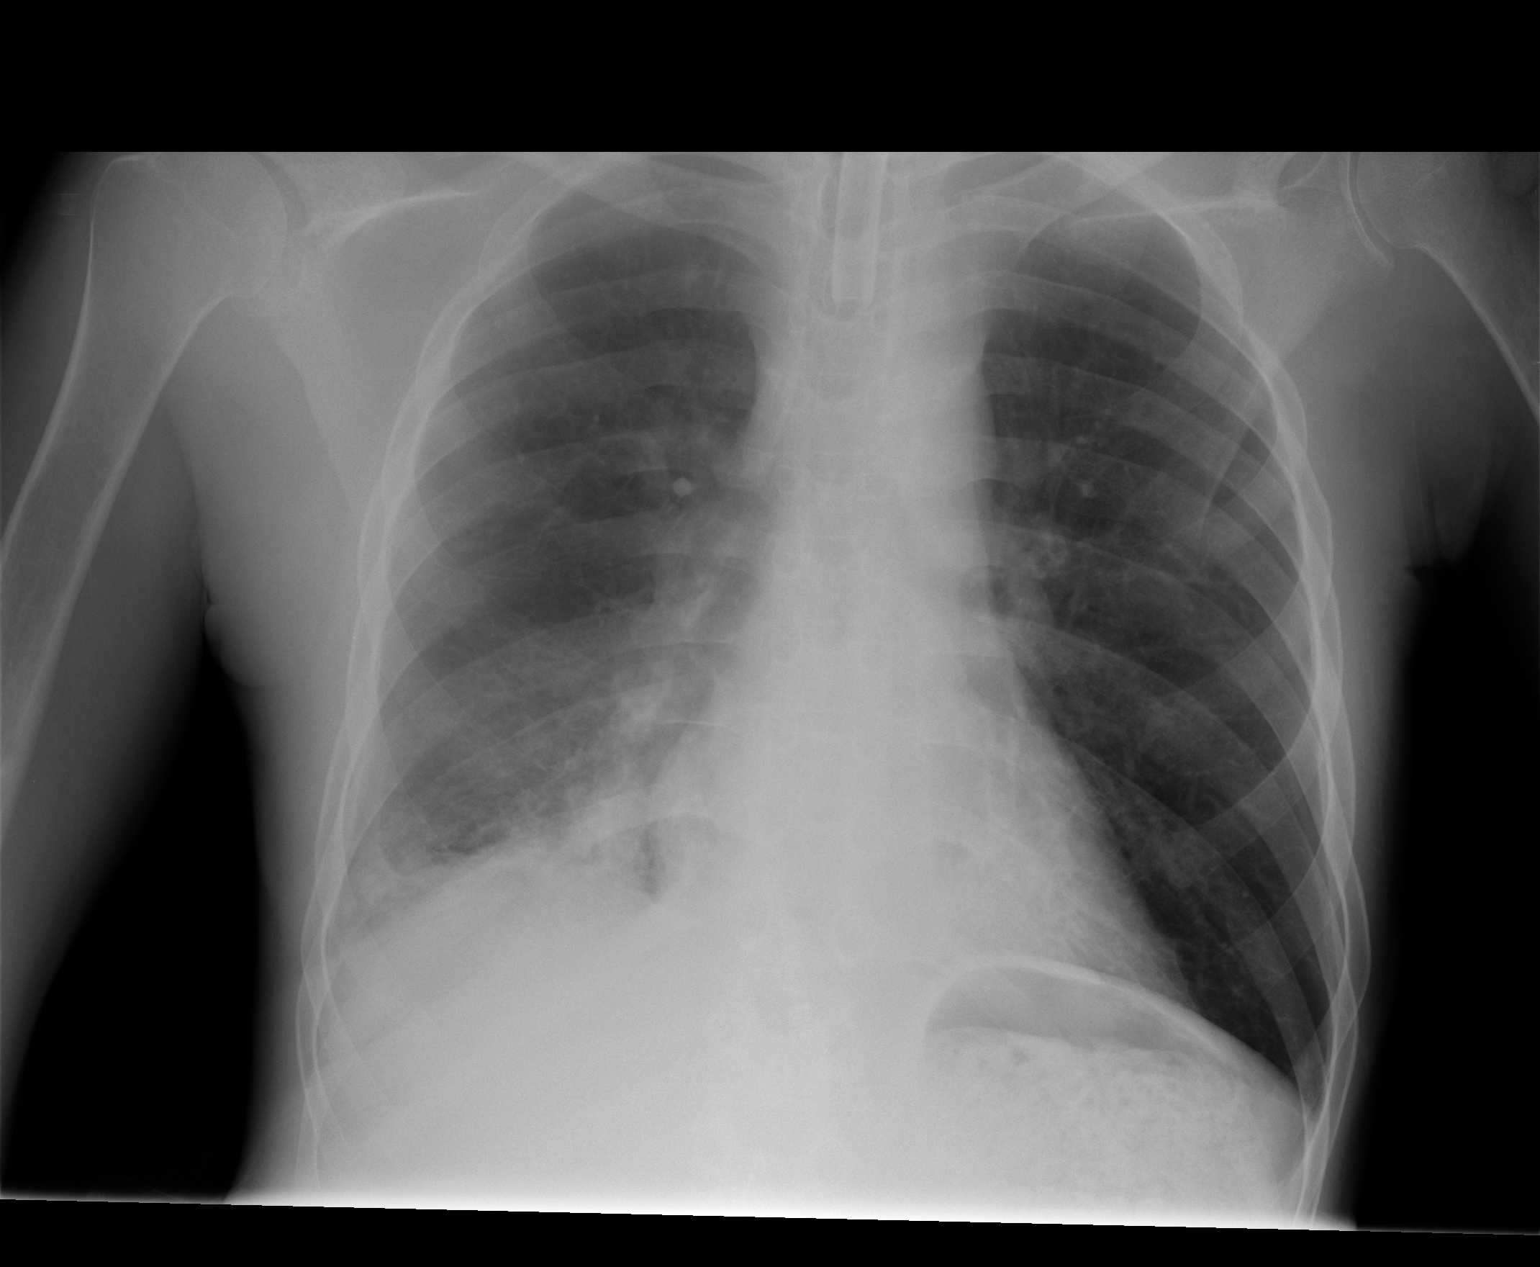

[1 of 1 positions shown; findings below may reference images not displayed]

FINDINGS: The heart size and mediastinal contours are within normal limits.
Both lungs are clear. The visualized skeletal structures are
unremarkable.
IMPRESSION: Persistent alveolar opacity at the right lung base likely in the
lower lobe. There may be a small right pleural effusion. While the
findings are not greatly changed from August 2015 they are more
conspicuous since the [place] and July 2015 chest x-rays.

Despite the lack of any current chest complaints, chest CT scanning
is recommended given the lack of significant clearing of this region
over the past 9 weeks.

## 2018-01-22 IMAGING — US US ABDOMEN COMPLETE
1 series · 13 of 25 positions shown · non-contrast
Comparison: CT abdomen and pelvis 08/26/2016

CLINICAL DATA: Sepsis. Decreased blood pressure tonight. Patient is
on ventilator. History of diabetes, partial quadriplegic, stroke.

EXAM:
ABDOMEN ULTRASOUND COMPLETE

[Series 1: us abdomen complete · 0.30mm/px · 13 of 89 slices shown]
[im 1/89]
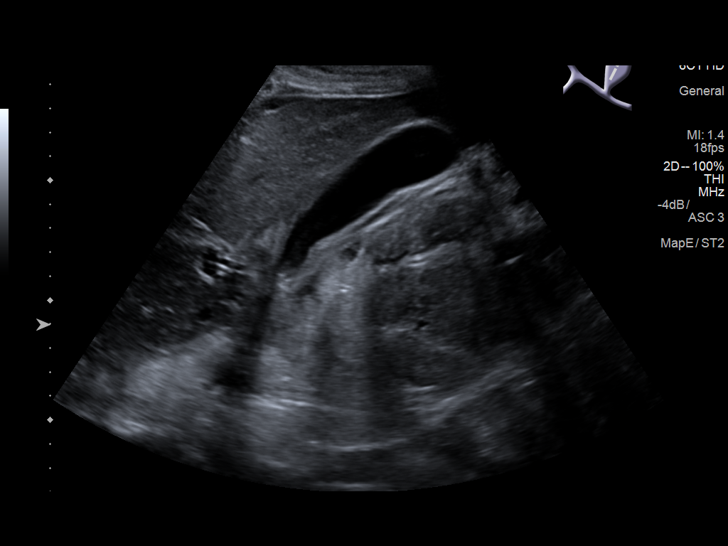
[im 8/89]
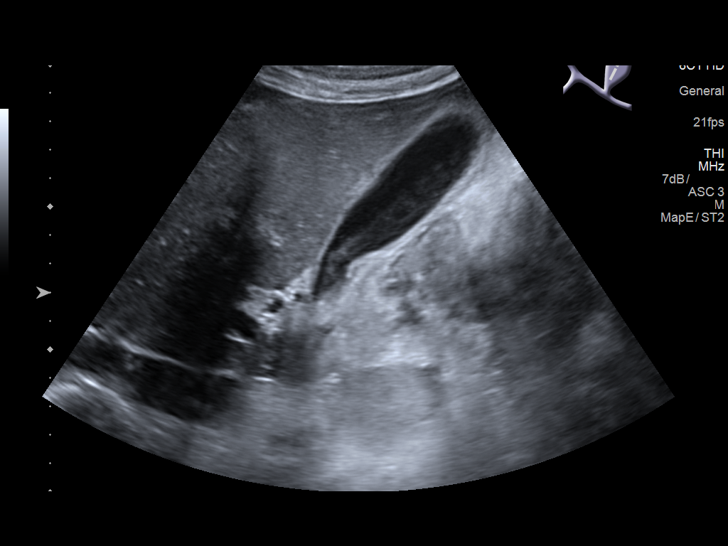
[im 15/89]
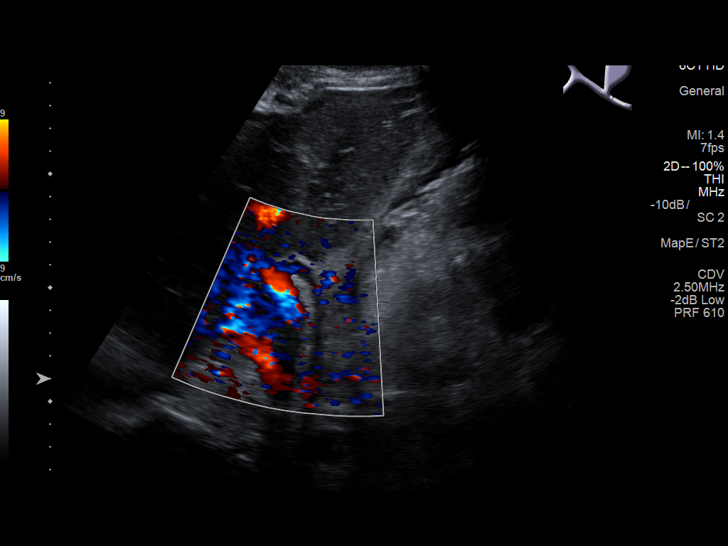
[im 23/89]
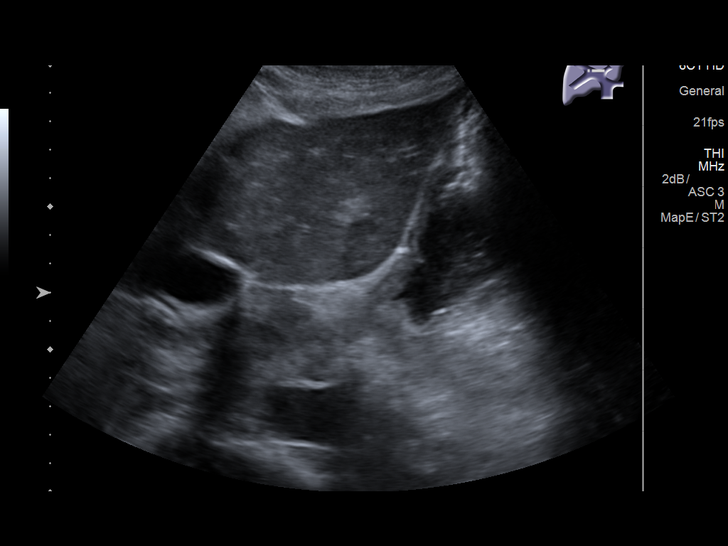
[im 30/89]
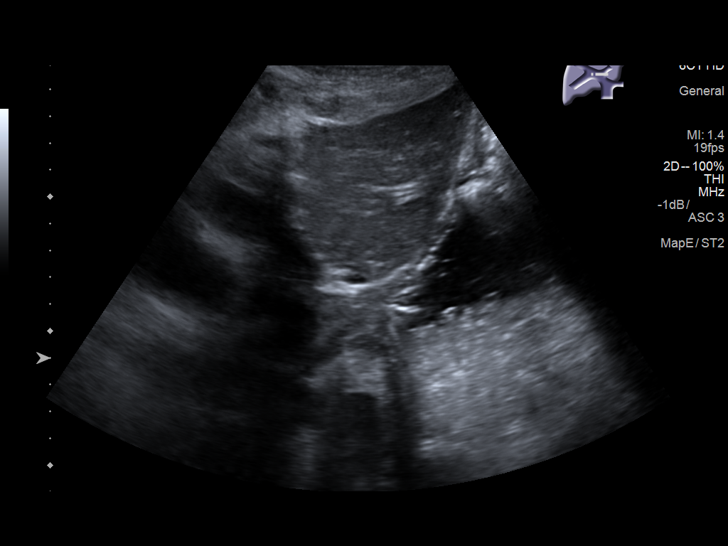
[im 37/89]
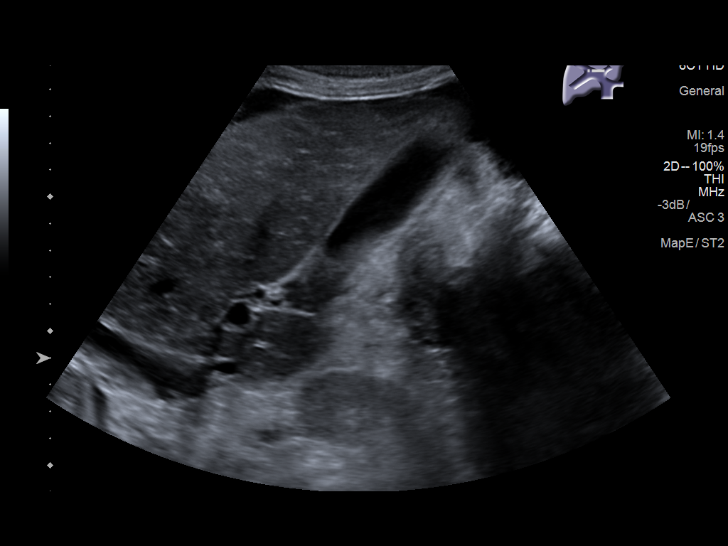
[im 45/89]
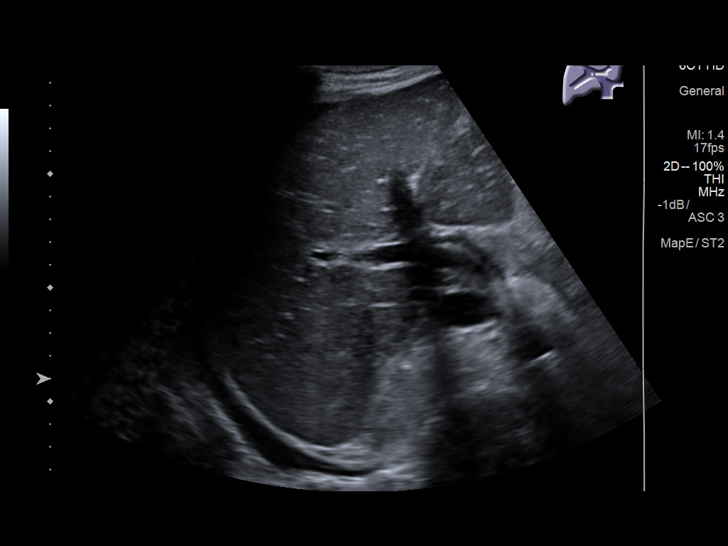
[im 52/89]
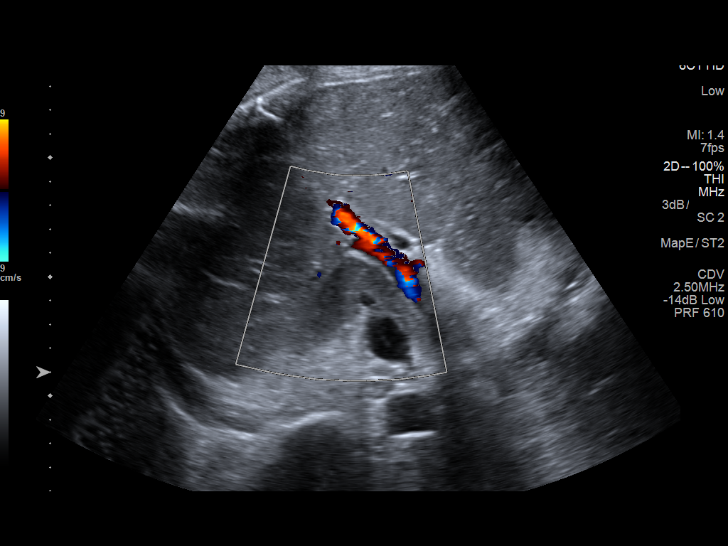
[im 59/89]
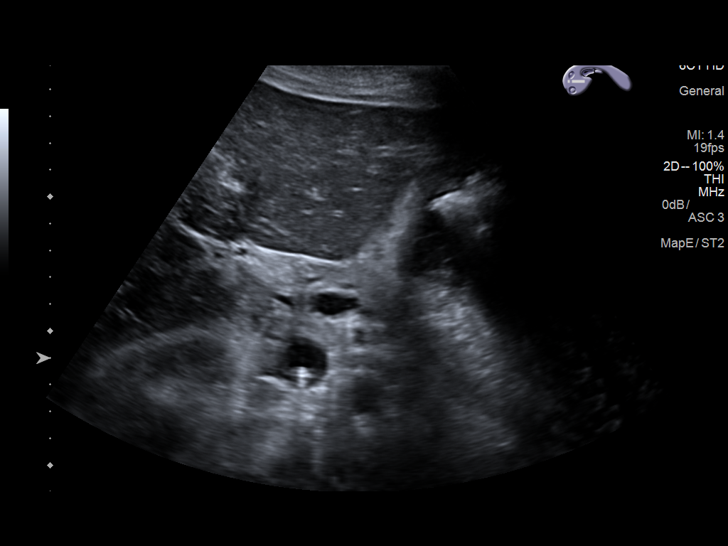
[im 67/89]
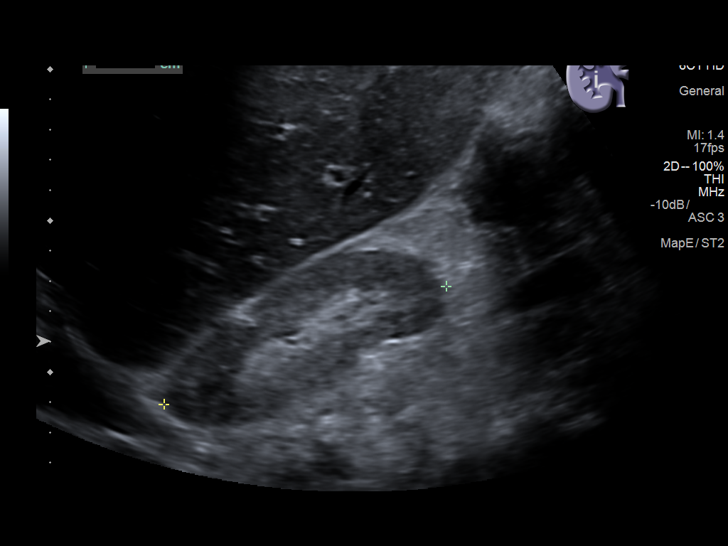
[im 74/89]
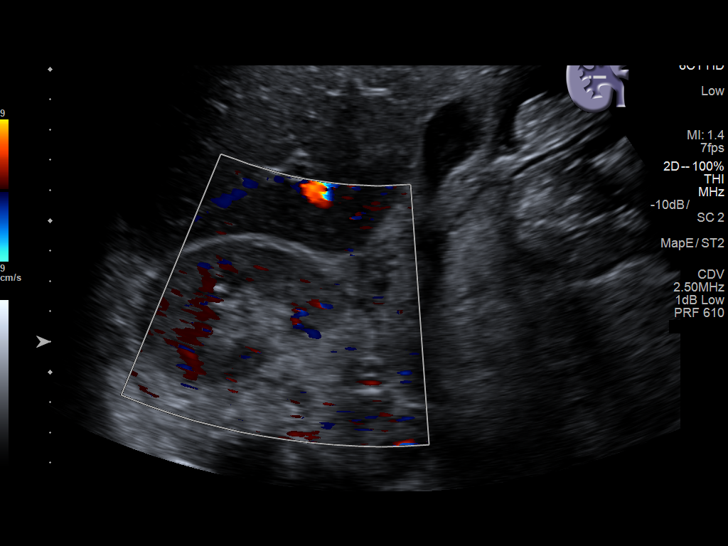
[im 81/89]
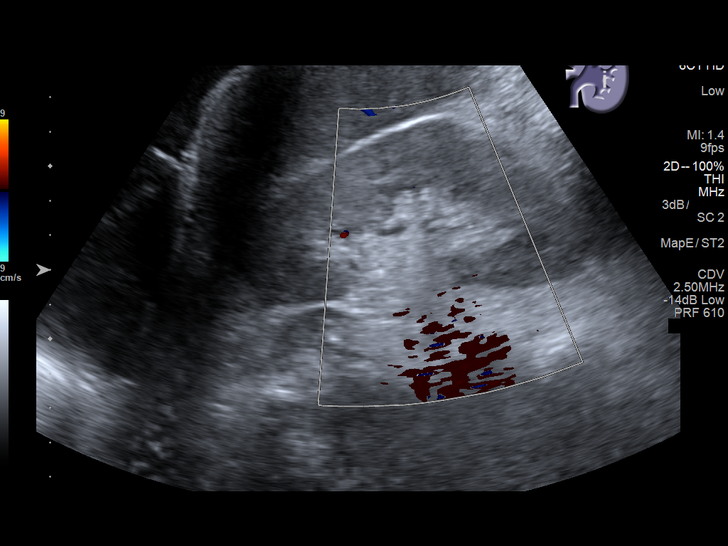
[im 89/89]
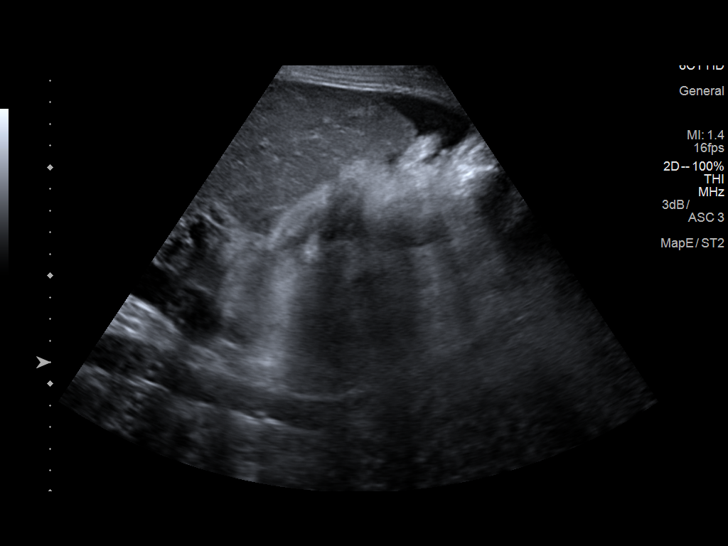

[13 of 25 positions shown; findings below may reference images not displayed]

FINDINGS: Gallbladder: Small stones in the gallbladder neck. Diffuse sludge in
the gallbladder. No gallbladder wall thickening or edema. Murphy's
sign is negative.

Common bile duct: Diameter: 5.3 mm, normal

Liver: No focal lesion identified. Within normal limits in
parenchymal echogenicity.

IVC: The IVC filter is noted.

Pancreas: Limited visualization due to overlying bowel gas.

Spleen: Size and appearance within normal limits.

Right Kidney: Length: 10.1 cm. Increased parenchymal echotexture
suggesting medical renal disease. No mass or hydronephrosis
visualized.

Left Kidney: Length: 10.9 cm. Increased parenchymal echotexture
suggesting medical renal disease. No mass or hydronephrosis
visualized.

Abdominal aorta: No aneurysm visualized.

Other findings: Free fluid around the liver and in the right lower
quadrant and left lower quadrant consistent with ascites. Small
bilateral pleural effusion.
IMPRESSION: Small stones in the gallbladder neck with sludge in the gallbladder.
No wall thickening or edema. Increased parenchymal echotexture in
the kidneys suggesting medical renal disease. Diffuse ascites with
small bilateral pleural effusion.

## 2018-02-13 IMAGING — CR DG CHEST 1V PORT
1 series · 1 of 1 positions shown · non-contrast
Comparison: Radiograph dated 11/02/2015

CLINICAL DATA: 29-year-old male with sepsis and decreased O2 sats.

EXAM:
PORTABLE CHEST 1 VIEW

[AP]
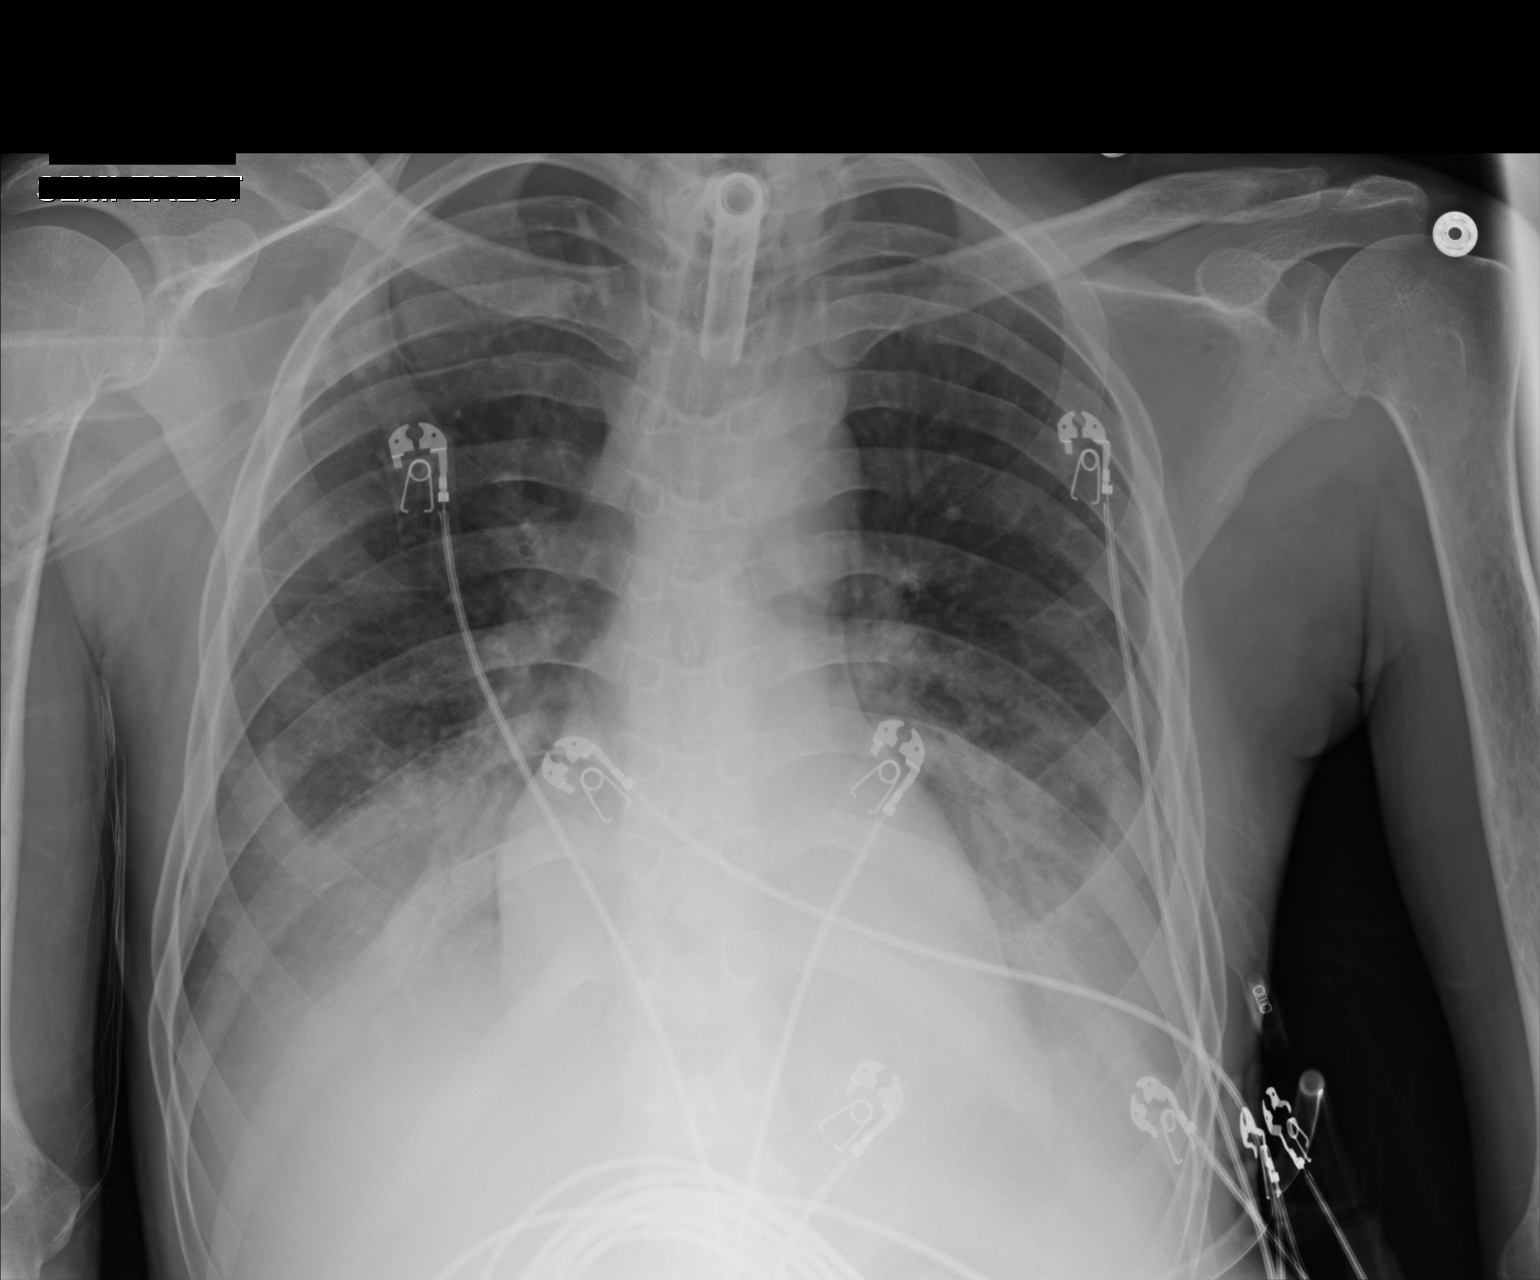

[1 of 1 positions shown; findings below may reference images not displayed]

FINDINGS: Tracheostomy remains in stable positioning above the carina. There
are bibasilar opacities with silhouetting of the diaphragm,
concerning for pneumonia. There has been interval increase in the
left lung base density compared to the prior study. Small bilateral
pleural effusions may be present. The cardiac silhouette is within
normal limits. No acute osseous pathology.
IMPRESSION: Bibasilar airspace densities concerning for pneumonia. Clinical
correlation and follow-up recommended.

## 2018-02-14 IMAGING — DX DG CHEST 1V PORT
1 series · 1 of 1 positions shown · non-contrast
Comparison: Chest radiograph performed 12/06/2015

CLINICAL DATA: Acute onset of respiratory failure. Initial
encounter.

EXAM:
PORTABLE CHEST 1 VIEW

[chest ap]
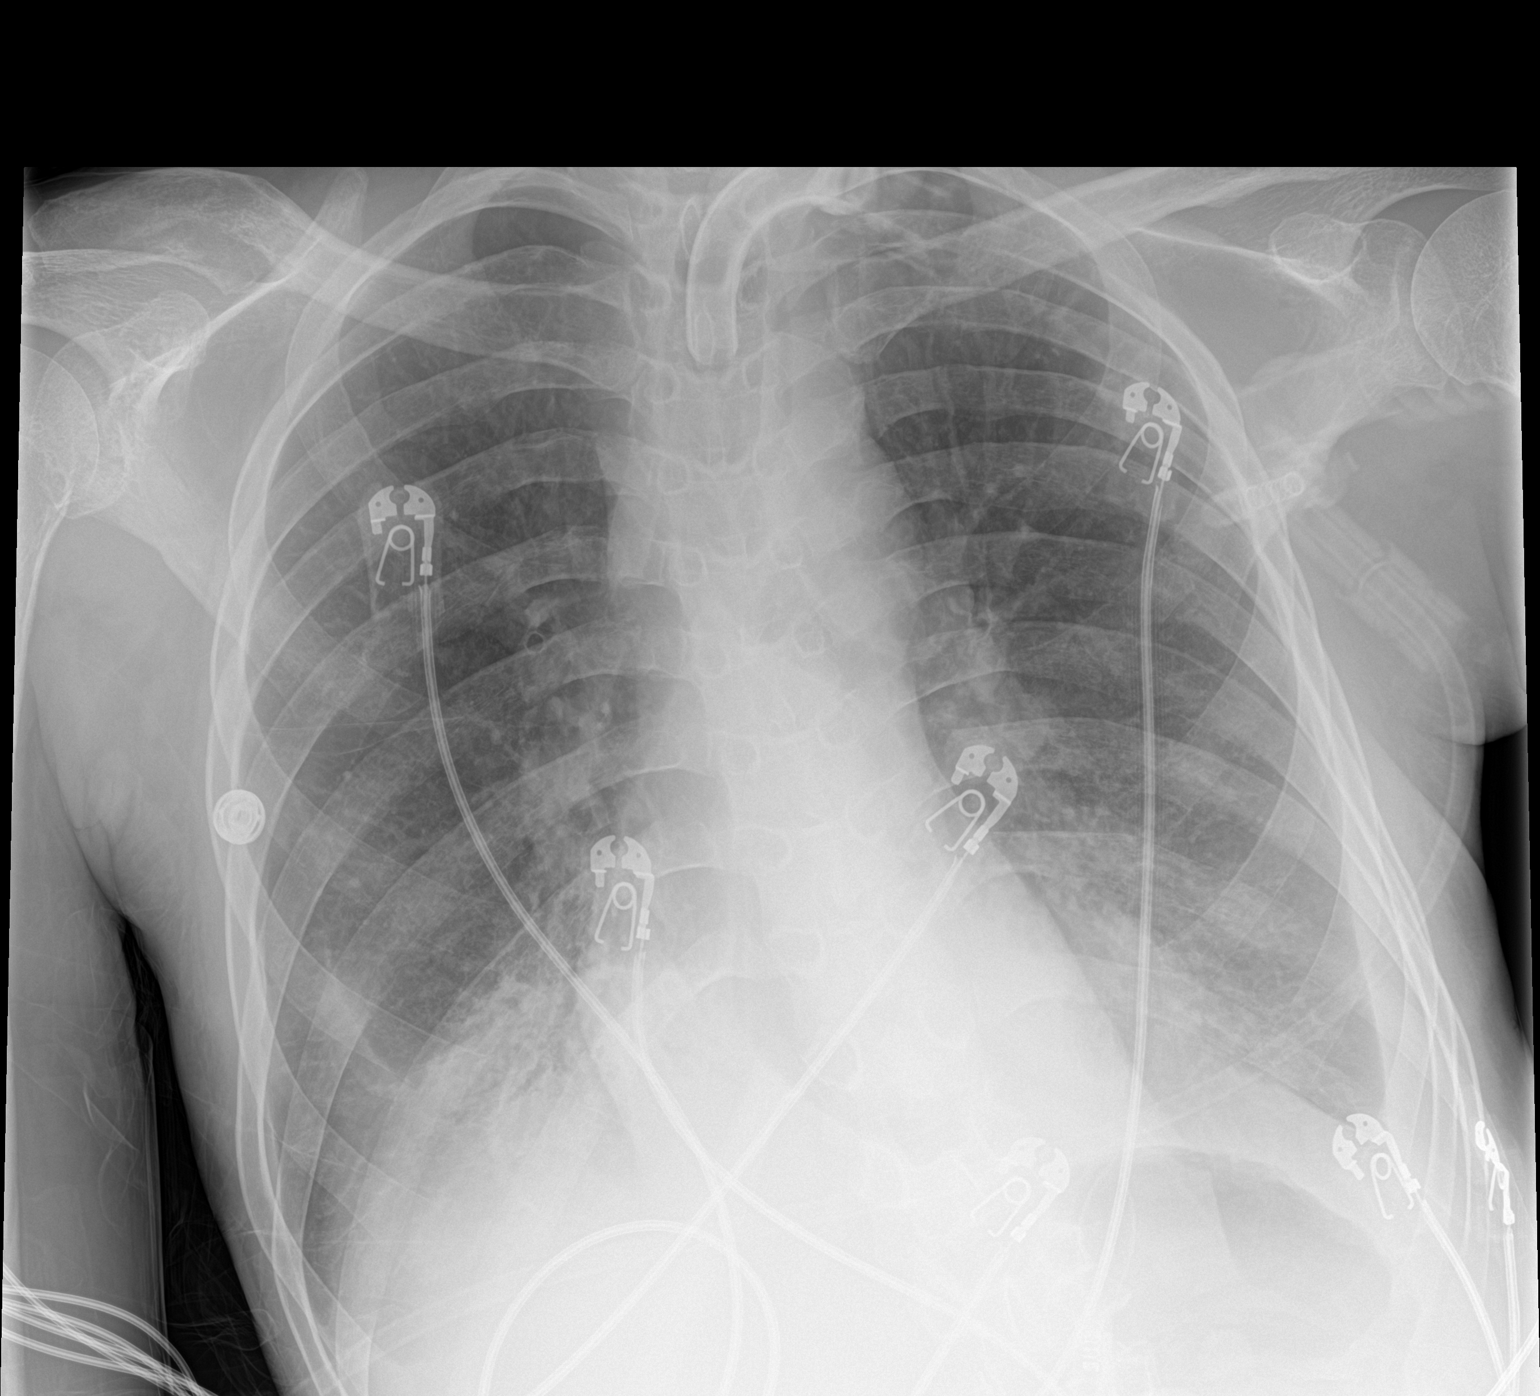

[1 of 1 positions shown; findings below may reference images not displayed]

FINDINGS: The patient's tracheostomy tube is seen ending 5 cm above the
carina.

Bibasilar airspace opacities may reflect pneumonia or pulmonary
edema. Small bilateral pleural effusions are noted. No pneumothorax
is seen.

The cardiomediastinal silhouette remains normal in size. No acute
osseous abnormalities are identified.
IMPRESSION: Bibasilar airspace opacities may reflect pneumonia or pulmonary
edema. Small bilateral pleural effusions noted.

## 2018-02-15 IMAGING — CR DG CHEST 1V PORT
2 series · 2 of 2 positions shown · non-contrast
Comparison: 12/07/2015.

CLINICAL DATA: Acute respiratory failure.

EXAM:
PORTABLE CHEST 1 VIEW

[AP (1 of 2)]
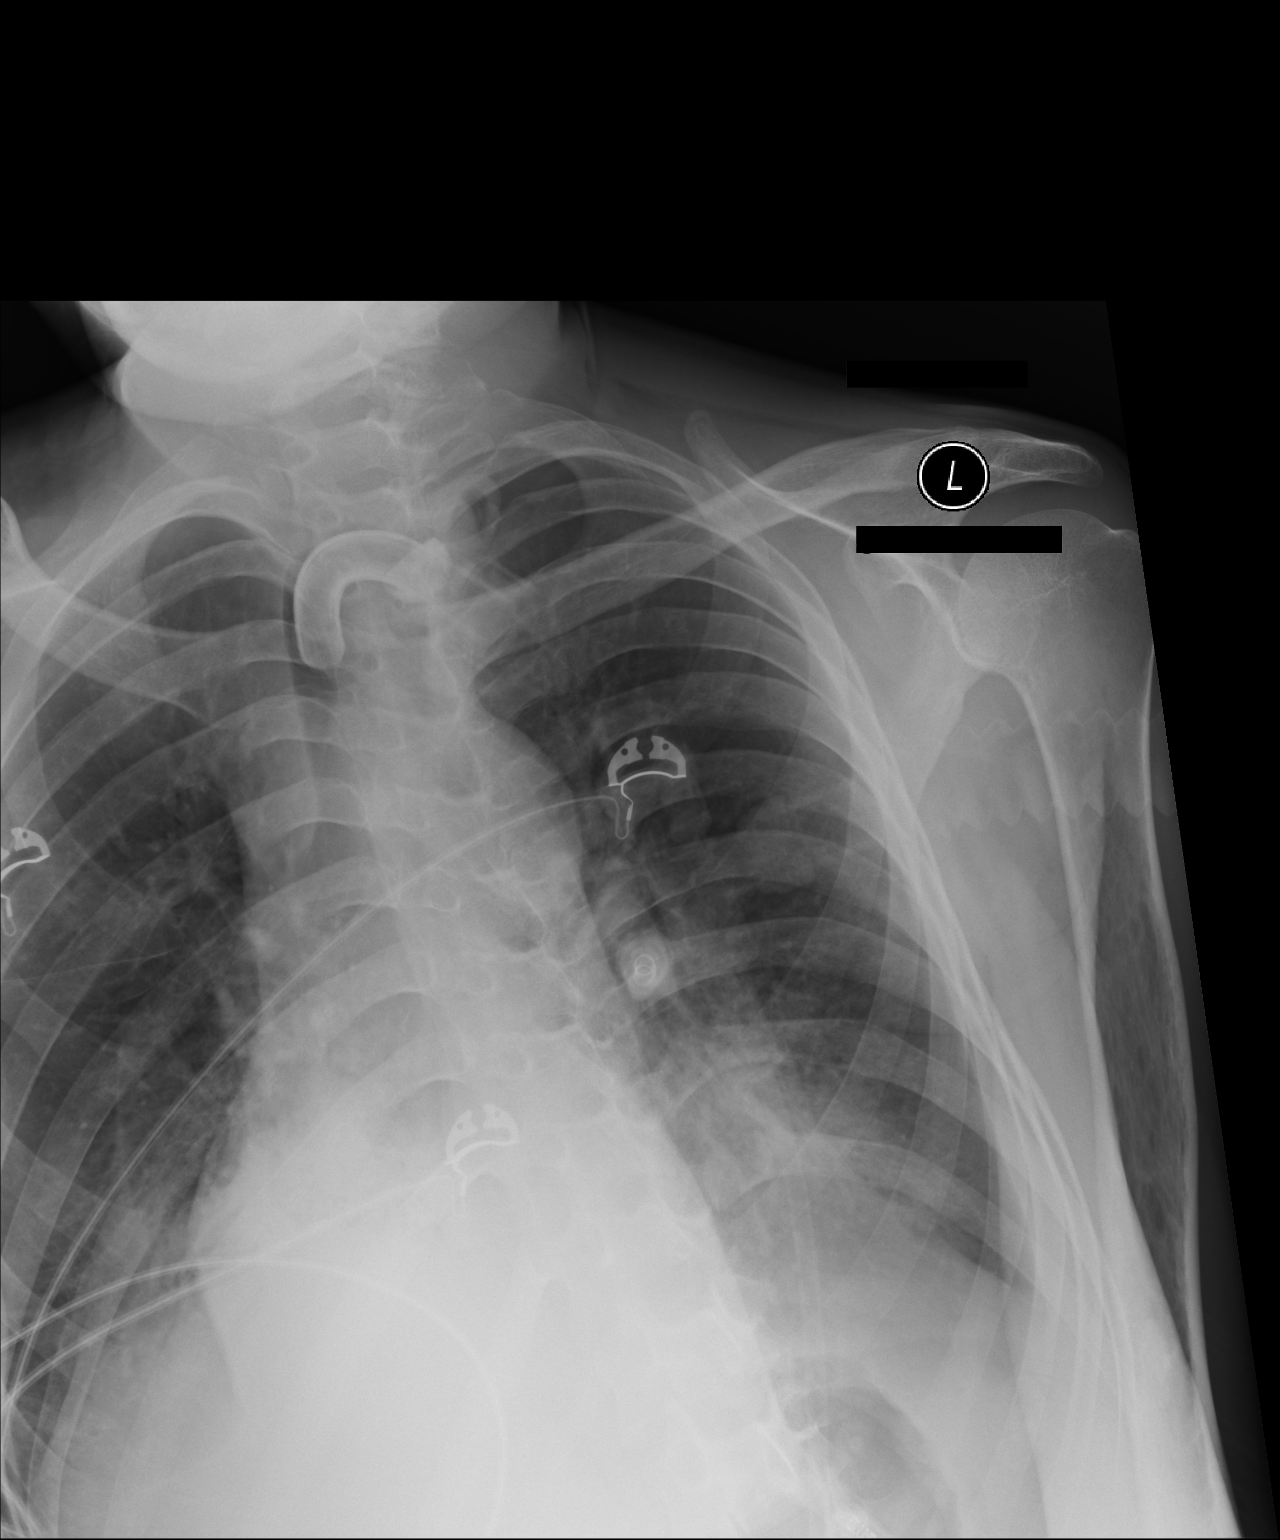

[AP (2 of 2)]
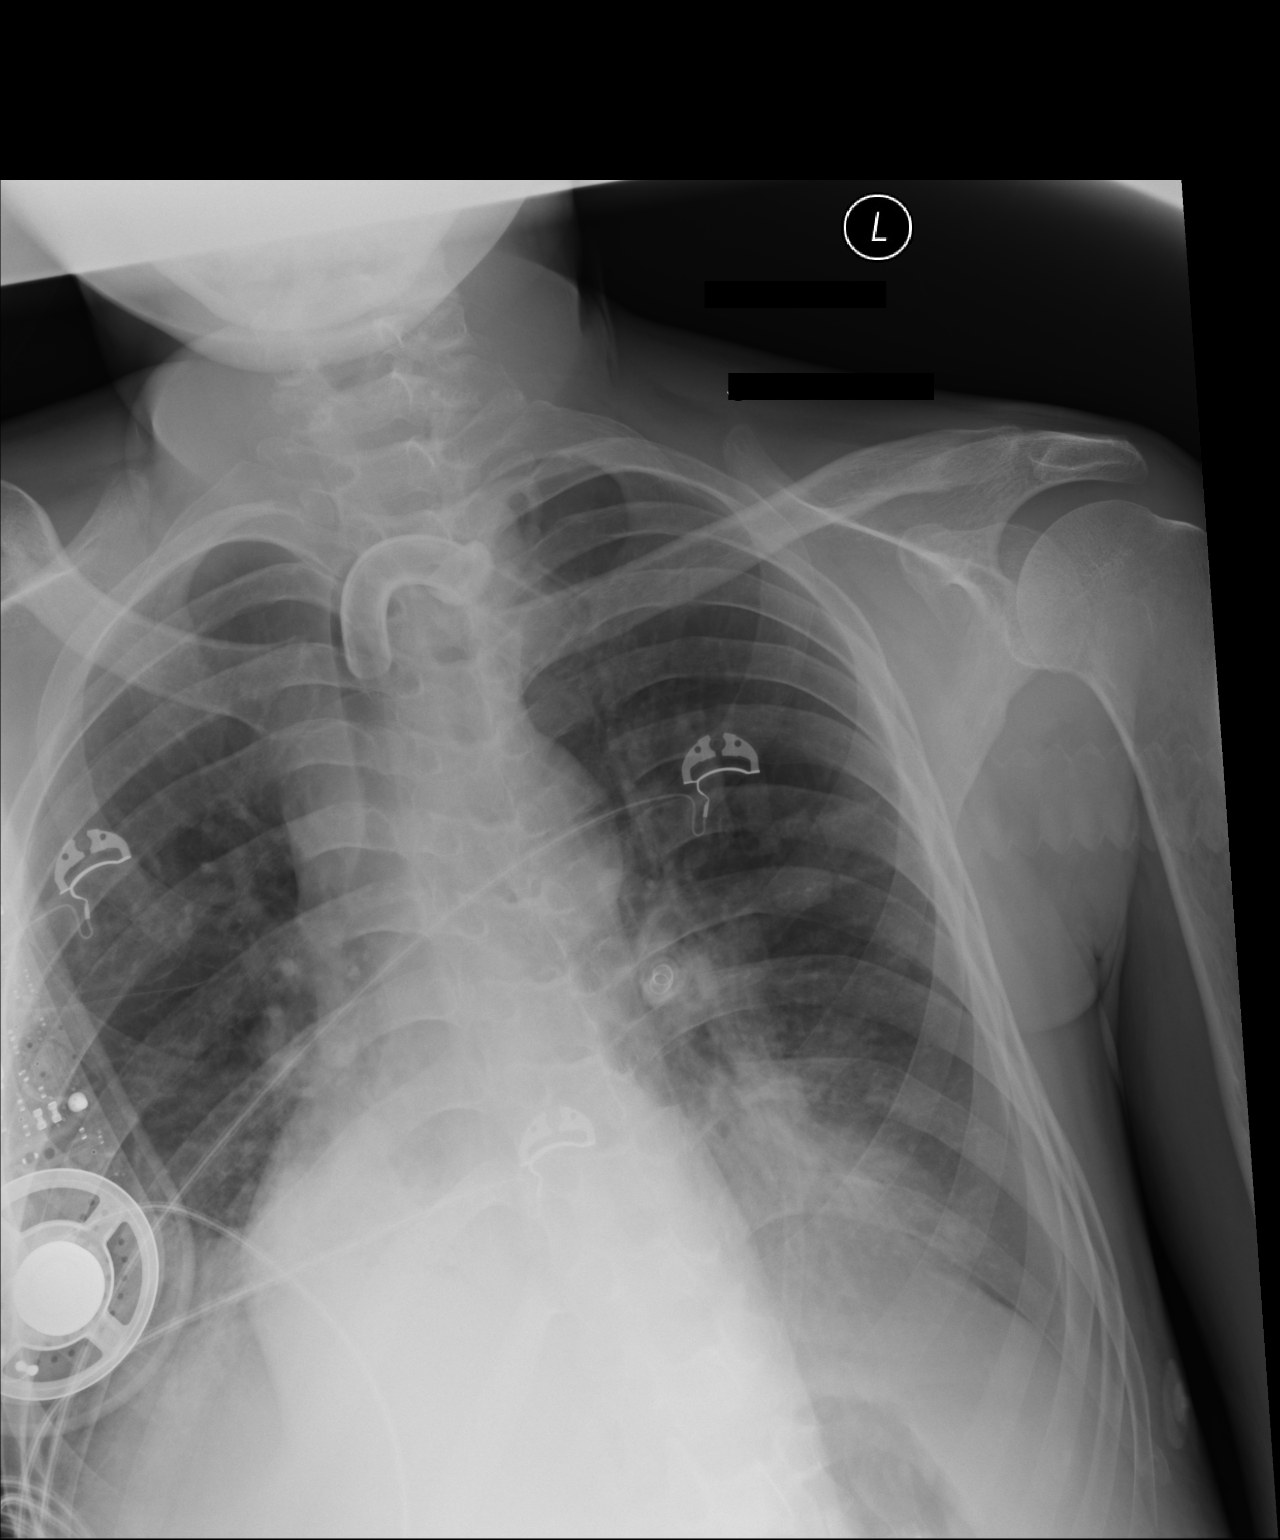

[2 of 2 positions shown; findings below may reference images not displayed]

FINDINGS: Tracheostomy tube in stable position. Heart size stable. Persistent
bibasilar atelectasis and/or infiltrates. Small left pleural
effusion. No pneumothorax. Right costophrenic angle not imaged.
IMPRESSION: 1. Tracheostomy tube in stable position.
2. Persistent bibasilar atelectasis and/or infiltrates without
interim clearing. Small left pleural effusion again noted.

## 2018-02-16 IMAGING — CR DG CHEST 1V PORT
1 series · 1 of 1 positions shown · non-contrast
Comparison: 12/08/2015.

CLINICAL DATA: Pneumonia.

EXAM:
PORTABLE CHEST 1 VIEW

[AP]
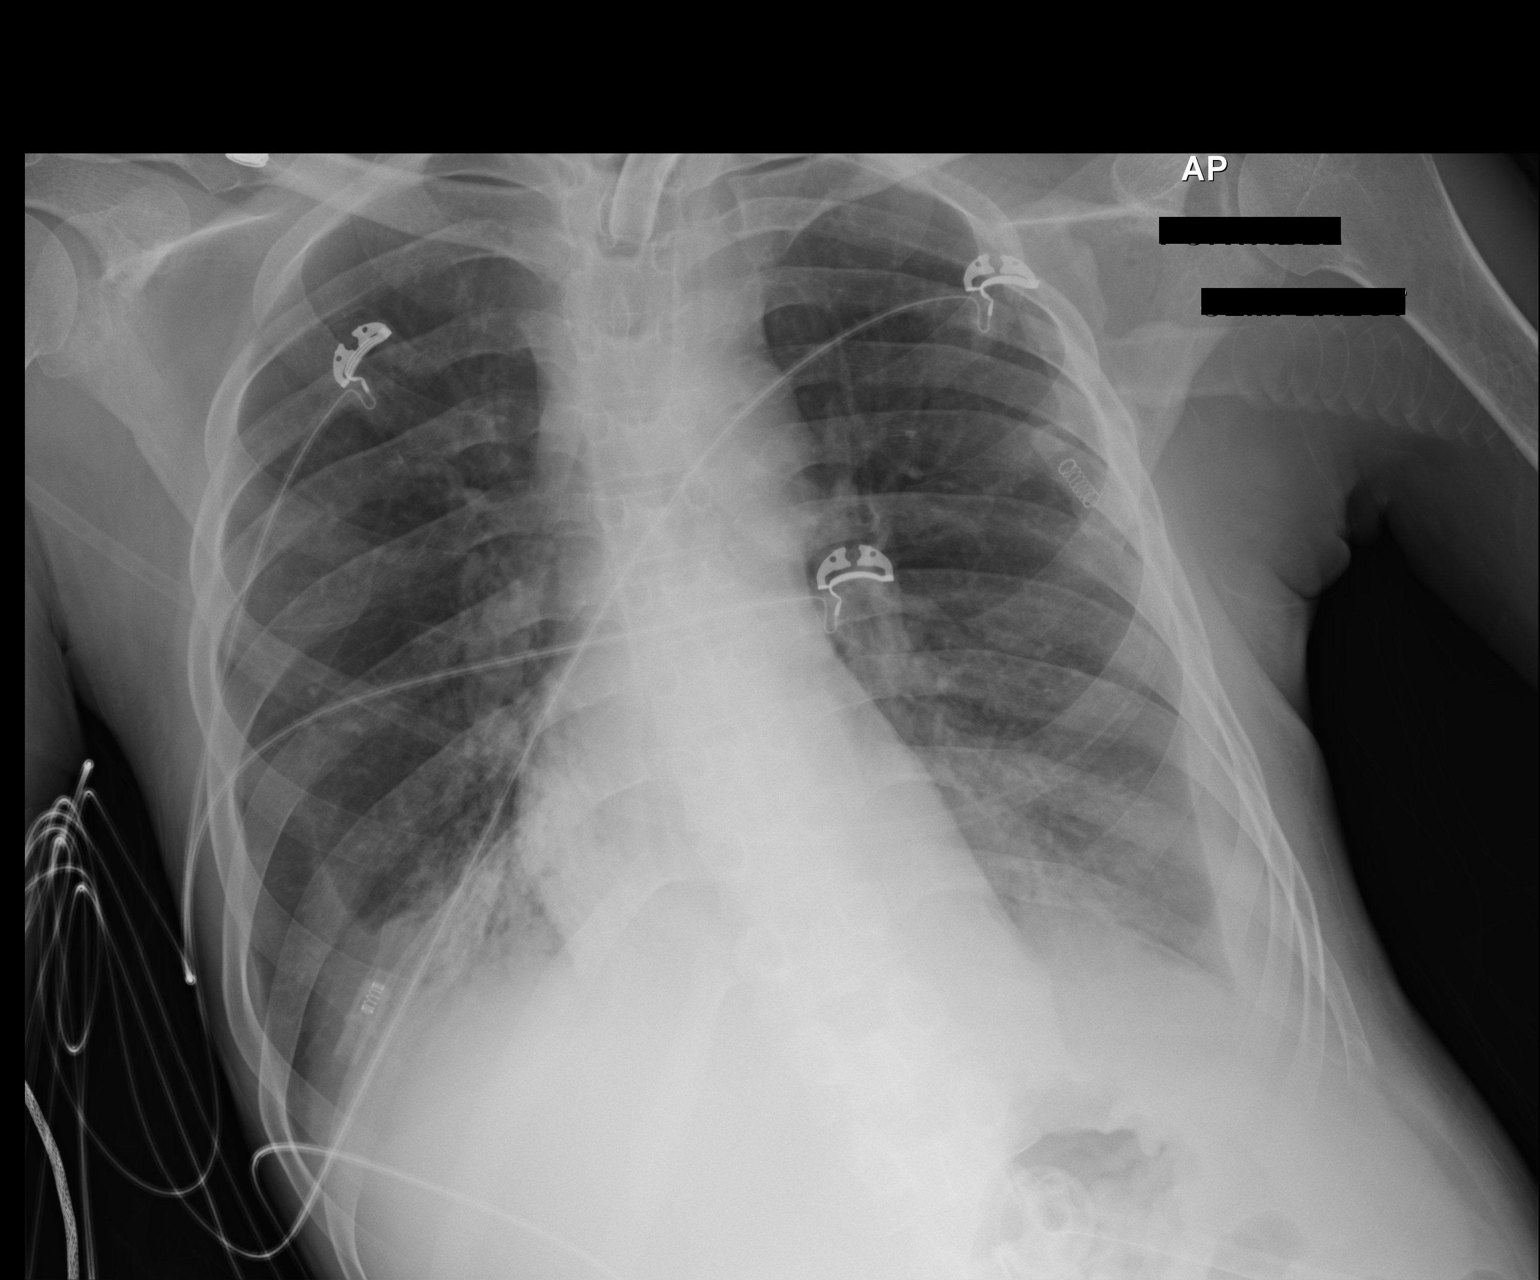

[1 of 1 positions shown; findings below may reference images not displayed]

FINDINGS: Tracheostomy to in stable position. Mediastinum hilar structures
normal. Low lung volumes with persistent bibasilar atelectasis
and/or infiltrates again noted. Small pleural effusions again noted.
No pneumothorax.
IMPRESSION: 1. Tracheostomy tube in stable position.
2. Low lung volumes with persistent bibasilar atelectasis and/or
infiltrates. Persistent bilateral pleural effusions. Similar
findings noted on prior exam.

## 2018-02-17 IMAGING — CT CT ABD-PELV W/O CM
2 of 4 series · 14 of 46 positions shown, 16 images · non-contrast
Comparison: None.

CLINICAL DATA: Paralyzed from accident. Rule out fistula from
bladder to ostomy. Severe case of MRSA. Decubitus ulcers to back and
buttocks. Oral contrast only due to inadequate IV access.

EXAM:
CT ABDOMEN AND PELVIS WITHOUT CONTRAST
TECHNIQUE: Multidetector CT imaging of the abdomen and pelvis was performed
following the standard protocol without IV contrast.

[Series 2: a/p w/o 5mm · axial · non-contrast · 0.65mm/px · z∈[+1020,+1446]mm · 11 of 103 slices shown, 13 images]
[im 9/103  soft-tissue]
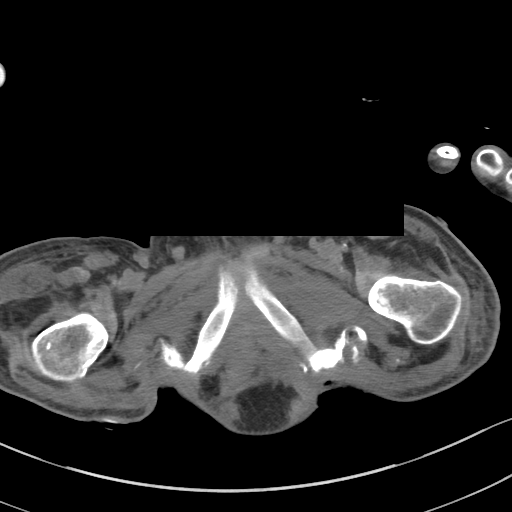
[im 9/103  bone]
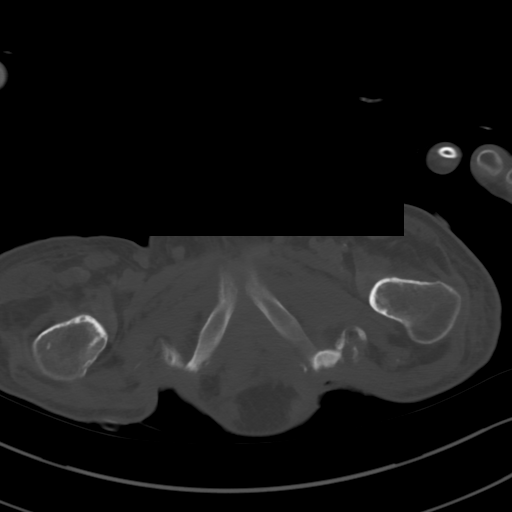
[im 17/103  soft-tissue]
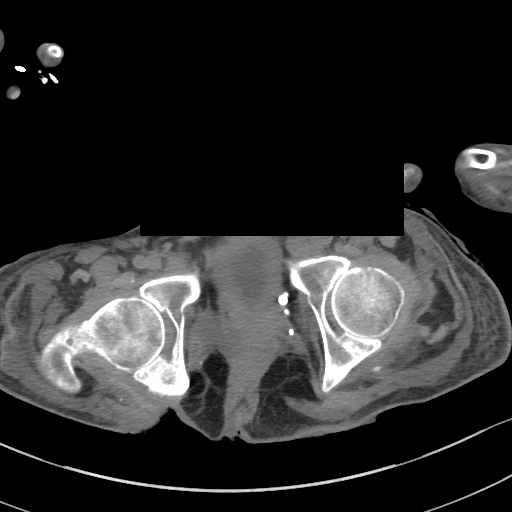
[im 25/103  soft-tissue]
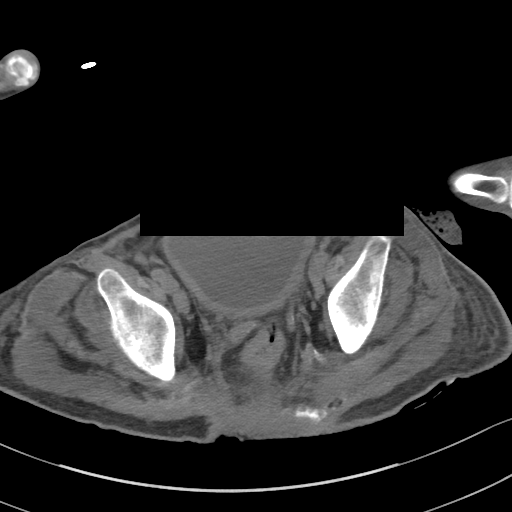
[im 33/103  soft-tissue]
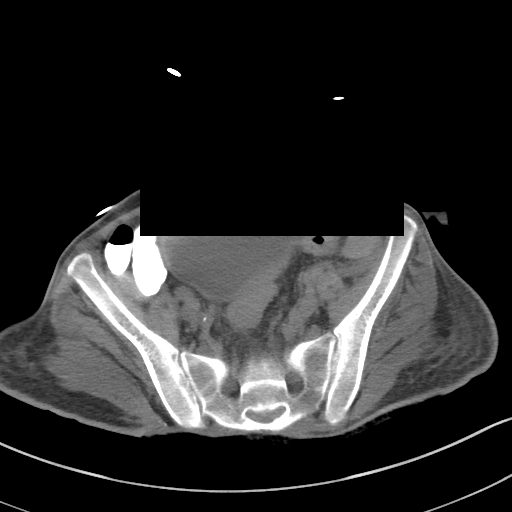
[im 41/103  soft-tissue]
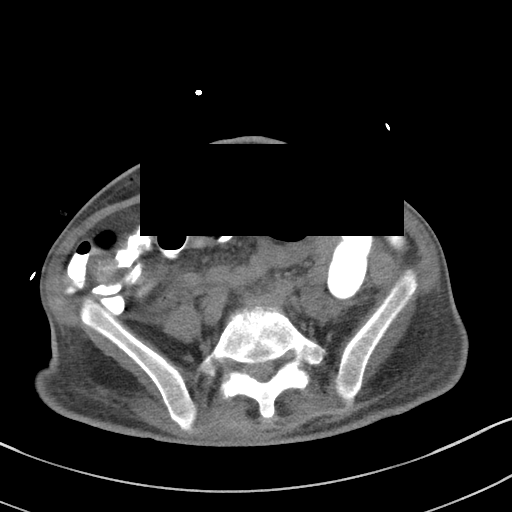
[im 54/103  soft-tissue]
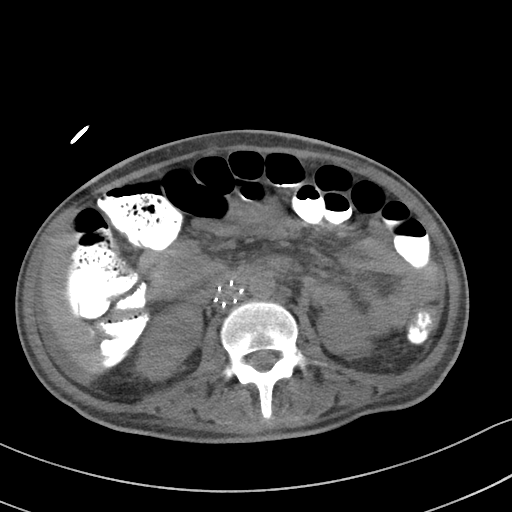
[im 62/103  soft-tissue]
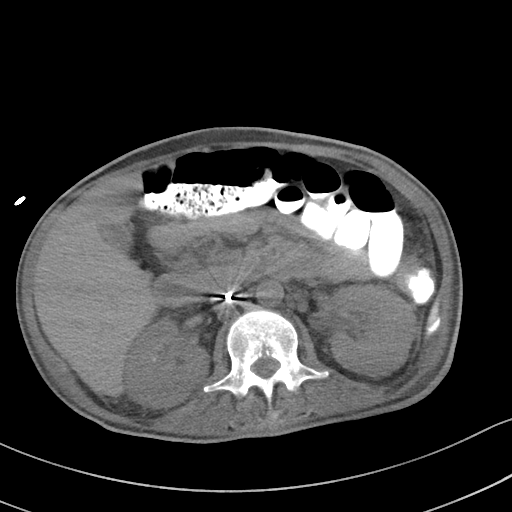
[im 70/103  soft-tissue]
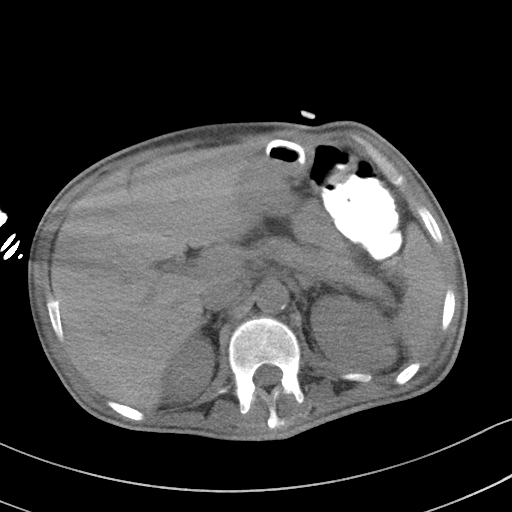
[im 78/103  soft-tissue]
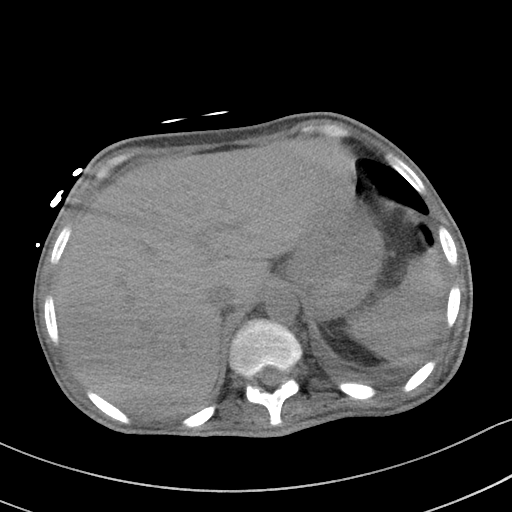
[im 78/103  bone]
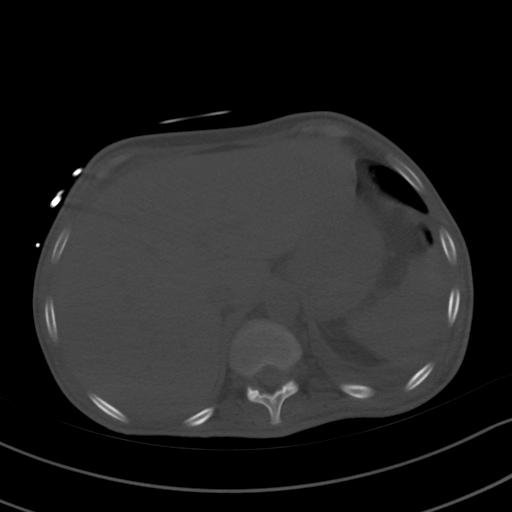
[im 86/103  soft-tissue]
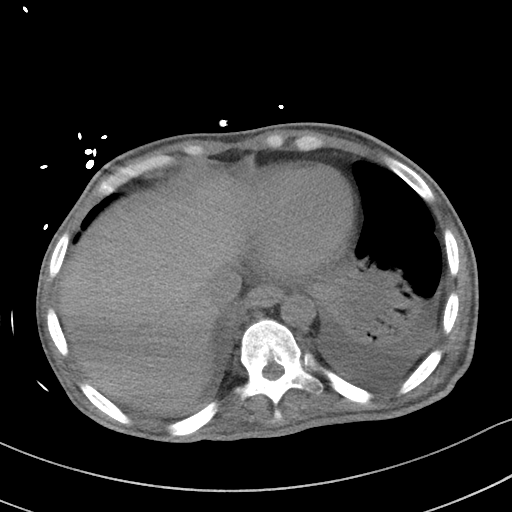
[im 94/103  soft-tissue]
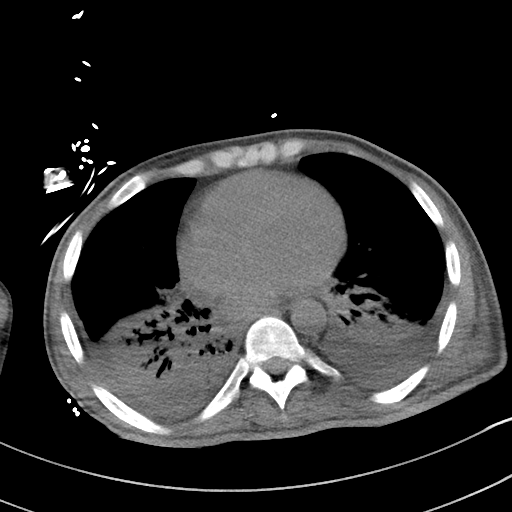

[Series 5: a/p w/o cor · coronal · non-contrast · 0.72mm/px · 3 of 119 slices shown]
[im 40/119  soft-tissue]
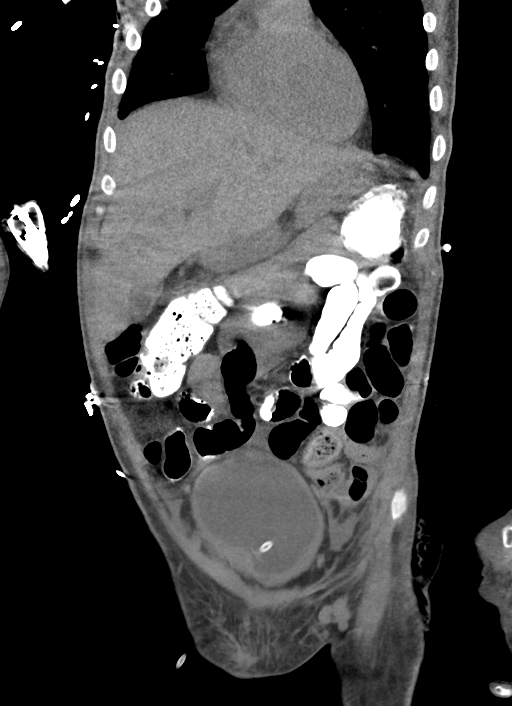
[im 53/119  soft-tissue]
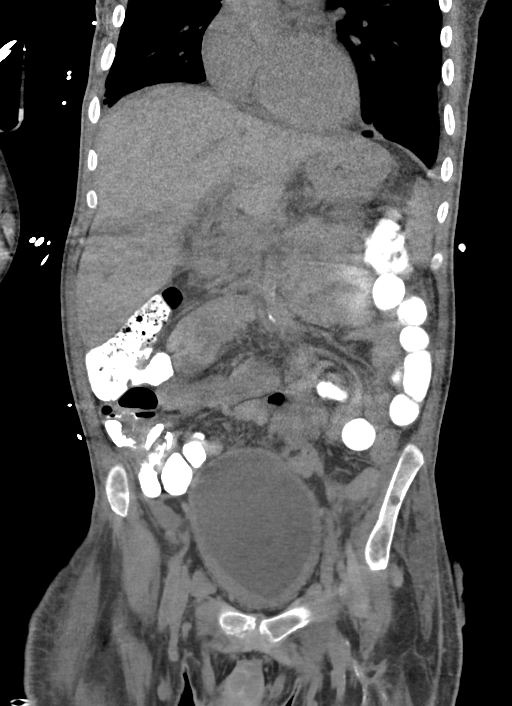
[im 66/119  soft-tissue]
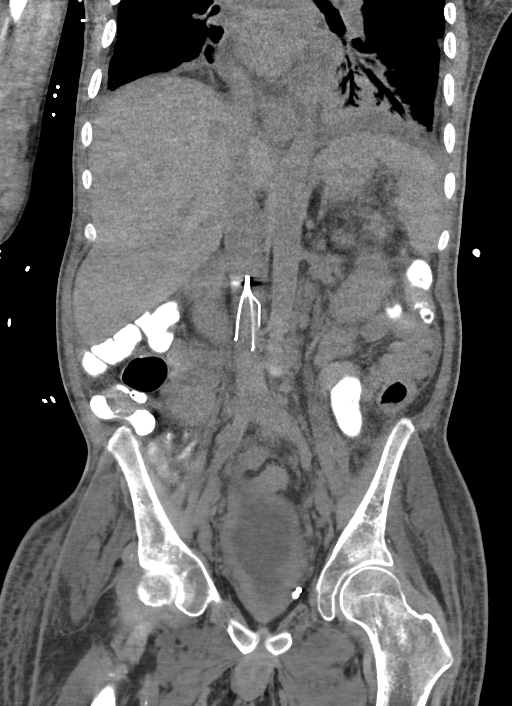

[14 of 46 positions shown; findings below may reference images not displayed]

FINDINGS: Lower chest: There are bibasilar consolidations which could
represent atelectasis or pneumonia. There are also small bilateral
pleural effusions.

Hepatobiliary: No mass visualized on this un-enhanced exam.

Pancreas: No mass or inflammatory process identified on this
un-enhanced exam.

Spleen: Within normal limits in size.

Adrenals/Urinary Tract: Bladder walls appear thickened. Suprapubic
catheter in place without obvious complicating feature. Kidneys are
unremarkable without stone or hydronephrosis.

Stomach/Bowel: Gastrostomy tube in place. Soft tissue defect of the
left lower abdominal wall, presumed colostomy. No large or small
bowel dilatation.

Vascular/Lymphatic: No pathologically enlarged lymph nodes. No
evidence of abdominal aortic aneurysm. IVC filter in place,
well-positioned just below the level of the renal veins.

Reproductive: No mass or other significant abnormality.

Other: Small amount of free fluid within the right paracolic gutter
and left lower quadrant. No abscess collection identified within the
abdomen or pelvis. No free intraperitoneal air. No obvious fistula
between the bladder and left lower abdominal ostomy site.

Musculoskeletal: Ill-defined edema throughout the subcutaneous soft
tissues suggesting anasarca. Soft tissue defects within the
bilateral buttocks region, compatible with the given history of
decubitus ulcers. Additional large soft tissue defect overlying the
sacrum, again compatible with the given history of decubitus ulcer.

Underlying the decubitus ulcer sites, there are fairly well-defined
cortical defects within the posterior right iliac bone and posterior
sacrum.
IMPRESSION: 1. Circumferential thickening of the walls of the bladder suspicious
for cystitis. Recommend correlation with urinalysis. Suprapubic
catheter in place.
2. Left lower abdominal wall defect, presumed colostomy. No
fistulous connection identified between the bladder and this
presumed colostomy. If clinical concern for fistula persists, could
consider CT cystogram by injecting contrast into the bladder through
the indwelling suprapubic catheter.
3. Small amount of ascites. No intra-abdominal or intrapelvic
abscess collection seen. No free intraperitoneal air.
4. Decubitus ulcers of the bilateral buttocks regions and overlying
the sacrum.
5. Cortical defects along the posterior margins of the right iliac
bone and posterior sacrum, somewhat well-defined, immediately
underlying the sites of the decubitus ulcers. I suspect that these
are chronic osseous erosions related to the overlying decubitus
ulcers. Alternatively, these could be postsurgical changes performed
in conjunction with decubitus ulcer debridements if surgical history
correlates. Destructive changes of osteomyelitis is certainly an
additional possibility. MRI may be helpful for further
characterization.
6. Bibasilar consolidations, atelectasis versus pneumonia, with
small adjacent pleural effusions.

These results will be called to the ordering clinician or
representative by the Radiologist Assistant, and communication
documented in the PACS or zVision Dashboard.

## 2018-02-20 IMAGING — CR DG CHEST 1V PORT
1 series · 1 of 1 positions shown · non-contrast
Comparison: December 09, 2015.

CLINICAL DATA: Pneumonia.

EXAM:
PORTABLE CHEST 1 VIEW

[AP]
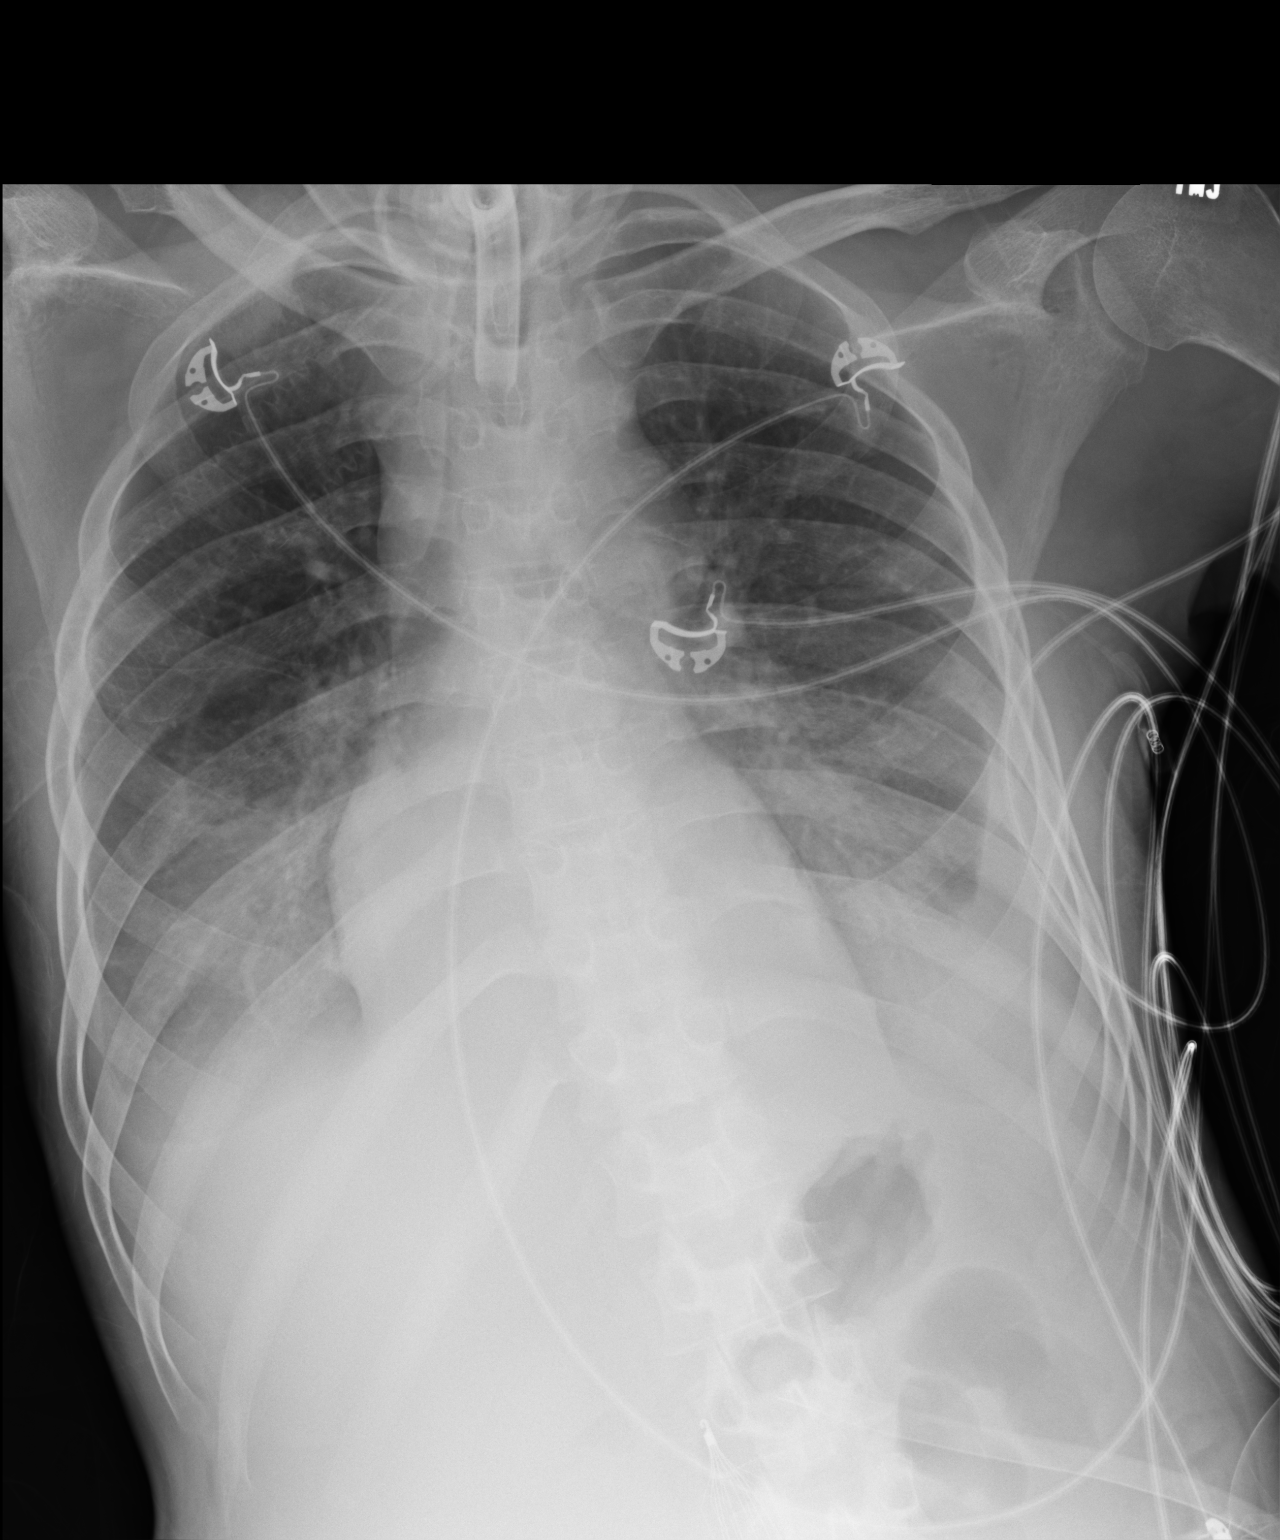

[1 of 1 positions shown; findings below may reference images not displayed]

FINDINGS: Stable cardiomediastinal silhouette. No pneumothorax is noted.
Mildly increased bilateral pleural effusions are noted with probable
associated atelectasis. Tracheostomy tube is unchanged in position.
Bony thorax is unremarkable.
IMPRESSION: Mildly increased bibasilar opacities are noted most consistent with
subsegmental atelectasis and associated pleural effusions.

## 2018-02-22 IMAGING — DX DG CHEST 1V PORT
1 series · 1 of 1 positions shown · non-contrast
Comparison: 12/13/2015

CLINICAL DATA: Followup exam.  Pneumonia.

EXAM:
PORTABLE CHEST 1 VIEW

[chest ap]
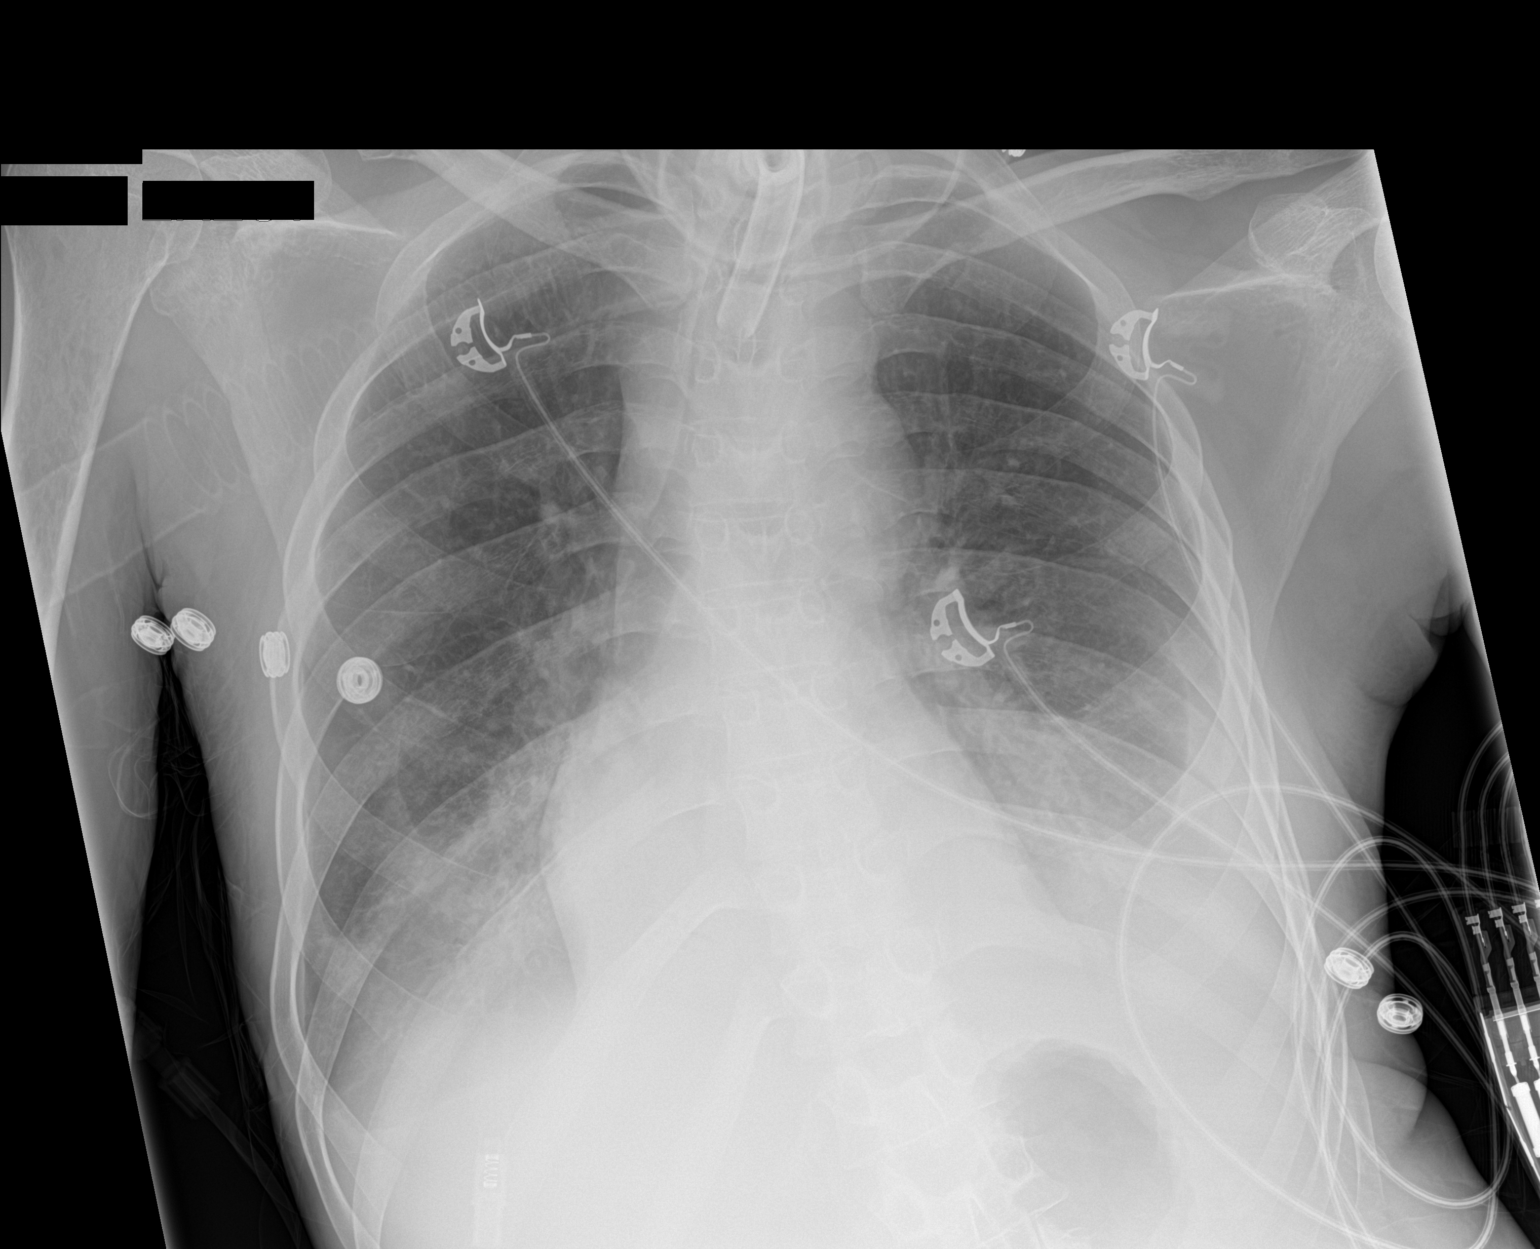

[1 of 1 positions shown; findings below may reference images not displayed]

FINDINGS: Persistent lung base opacity noted consistent with combination of
pleural fluid and atelectasis. Pneumonia not excluded. No evidence
of pulmonary edema. No pneumothorax.

Heart, mediastinum hila are unremarkable. Tracheostomy tube is
stable in well positioned.
IMPRESSION: 1. No change from the previous exam.
2. Persistent lower lung zone opacity consistent with a combination
pleural fluid and probable atelectasis. Pneumonia is possible.

## 2018-02-24 IMAGING — US US ABDOMEN LIMITED
1 series · 14 of 25 positions shown · non-contrast
Comparison: CT abdomen and pelvis September 11, 2016 and abdominal
ultrasound August 31, 2016

CLINICAL DATA: Liver failure. History of diabetes, colostomy,
osteomyelitis.

EXAM:
US ABDOMEN LIMITED - RIGHT UPPER QUADRANT

[Series 1: us abdomen limited · 0.30mm/px · 14 of 45 slices shown]
[im 1/45]
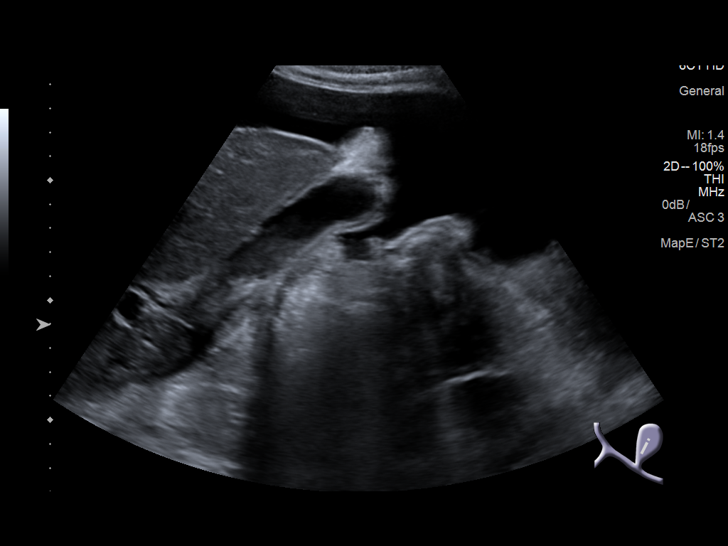
[im 4/45]
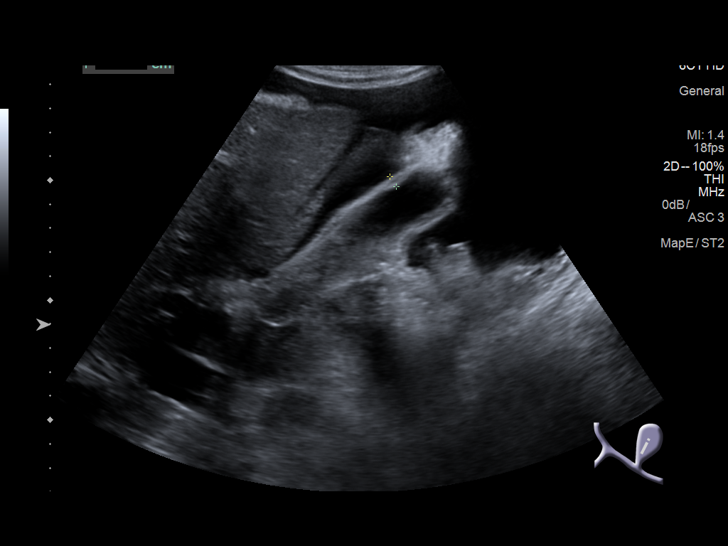
[im 8/45]
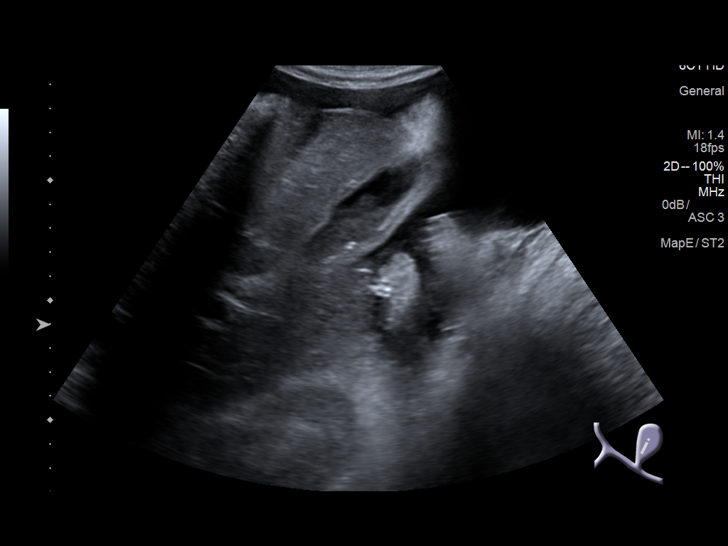
[im 12/45]
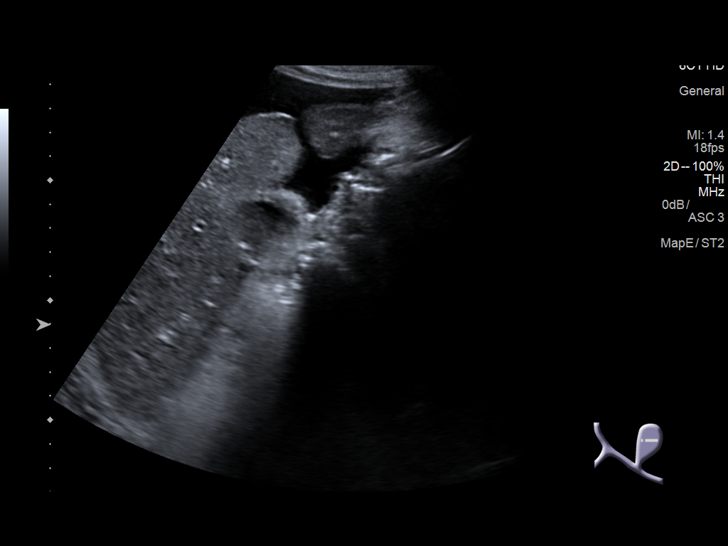
[im 15/45]
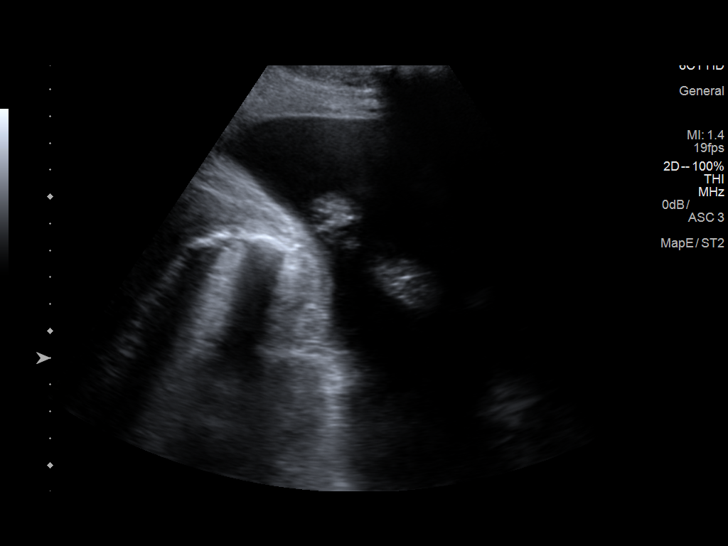
[im 17/45]
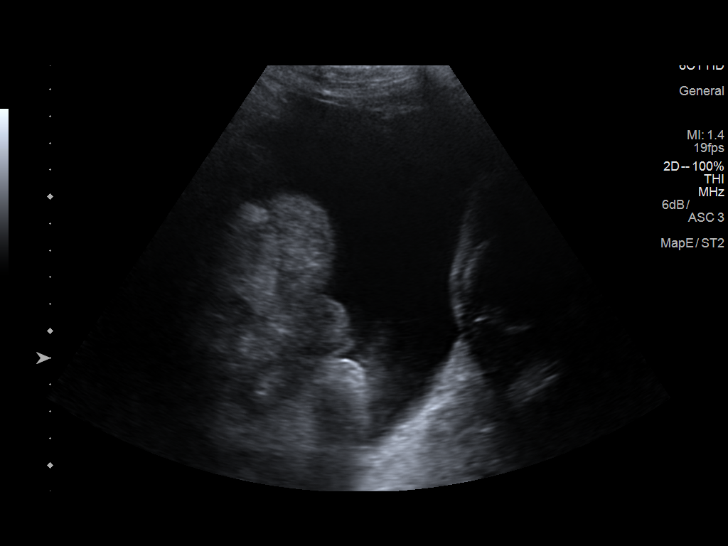
[im 21/45]
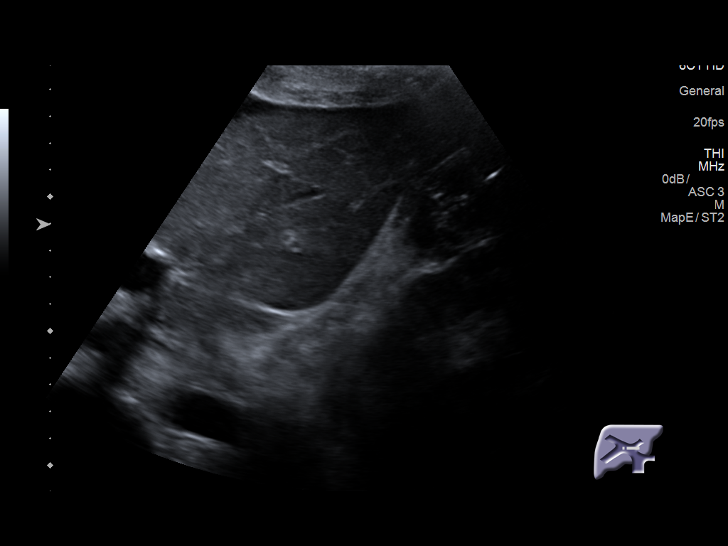
[im 24/45]
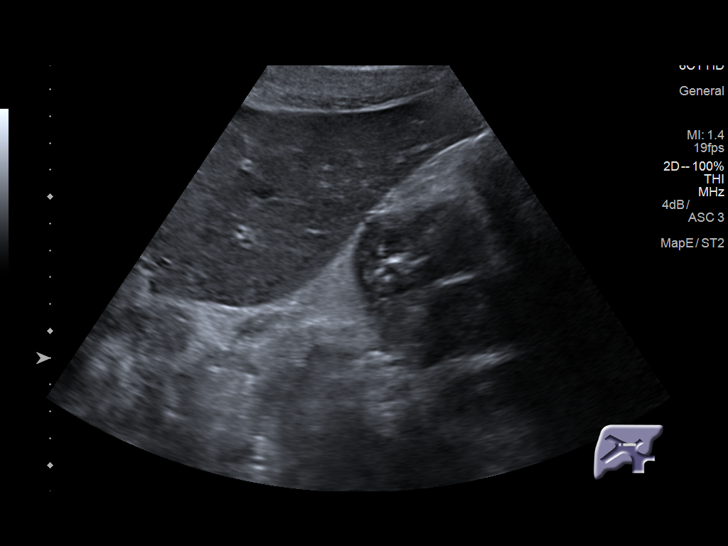
[im 28/45]
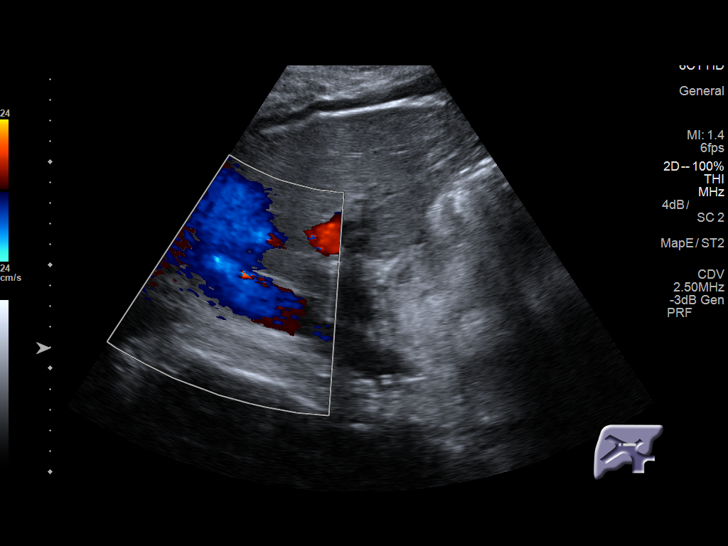
[im 30/45]
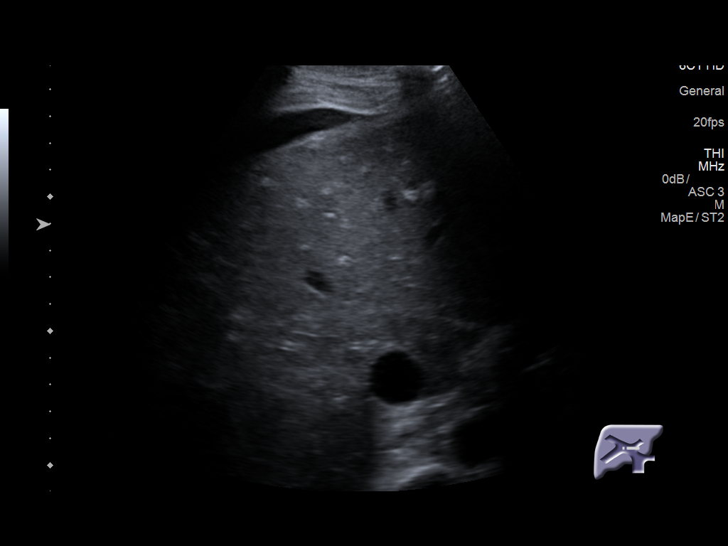
[im 34/45]
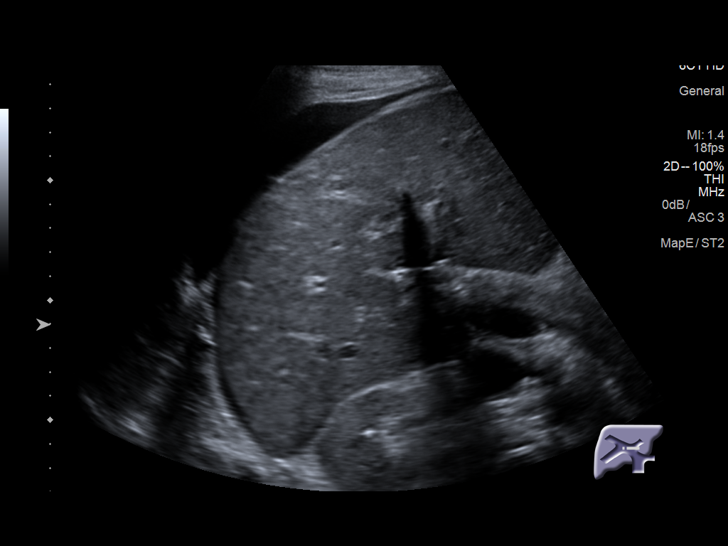
[im 37/45]
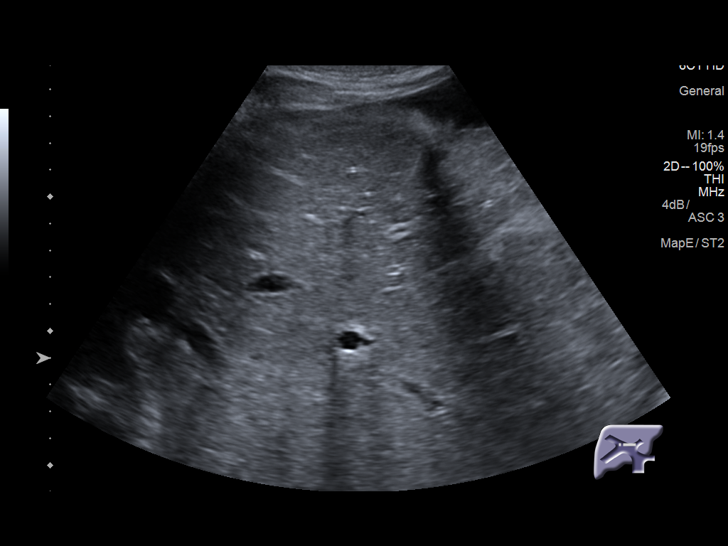
[im 41/45]
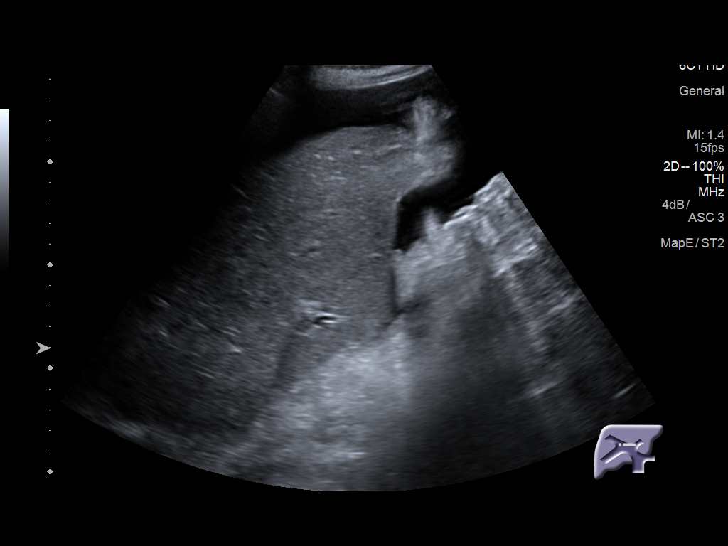
[im 45/45]
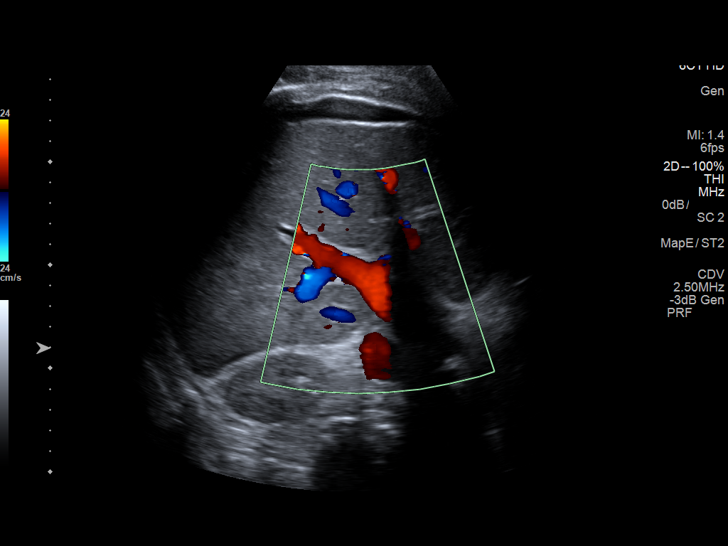

[14 of 25 positions shown; findings below may reference images not displayed]

FINDINGS: Gallbladder:

Layering sludge and tiny echogenic gallstones. Gallbladder wall
thickening at 5 mm. No pericholecystic fluid. No sonographic
Murphy's sign elicited.

Common bile duct:

Diameter: 5 mm.

Liver:

No focal lesion identified. Within normal limits in parenchymal
echogenicity. Hepatopetal portal vein.

Moderate amount of ascites.  Ascites.
IMPRESSION: Gallbladder sludge and tiny stones. Mild wall thickening likely
secondary to ascites without sonographic Murphy sign.

Ascites.

## 2018-02-25 IMAGING — CR DG ABD PORTABLE 1V
1 series · 1 of 1 positions shown · non-contrast
Comparison: CT of the abdomen and pelvis from 12/10/2015

CLINICAL DATA: Acute onset of abdominal distention. Initial
encounter.

EXAM:
PORTABLE ABDOMEN - 1 VIEW

[AP]
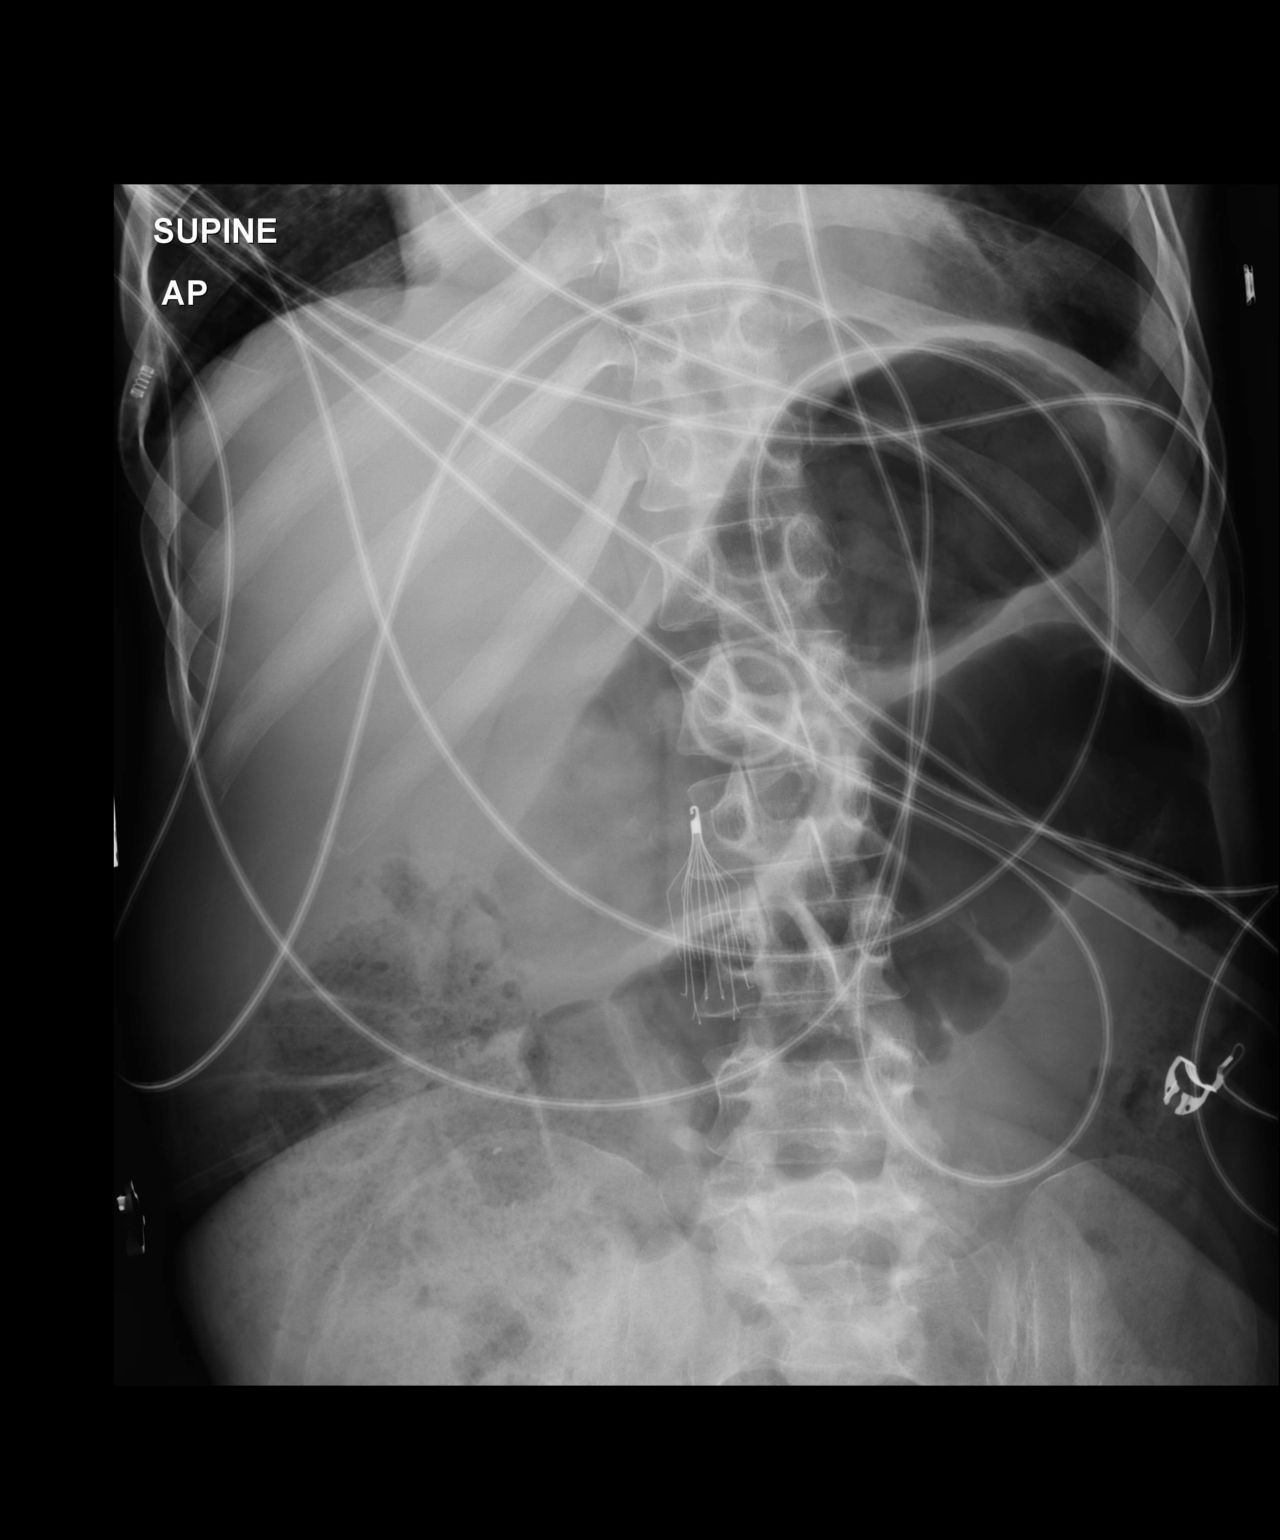

[1 of 1 positions shown; findings below may reference images not displayed]

FINDINGS: The visualized bowel gas pattern is unremarkable. Scattered air and
stool filled loops of colon are seen; no abnormal dilatation of
small bowel loops is seen to suggest small bowel obstruction. No
free intra-abdominal air is identified, though evaluation for free
air is limited on a single supine view. The stomach is partially
filled with air. An IVC filter is noted.

The visualized osseous structures are within normal limits; the
sacroiliac joints are unremarkable in appearance. The visualized
lung bases are essentially clear.
IMPRESSION: Unremarkable bowel gas pattern; no free intra-abdominal air seen.
Moderate amount of stool noted in the colon. Stomach partially
filled with air.

## 2018-02-27 IMAGING — CR DG CHEST 1V PORT
1 series · 1 of 1 positions shown · non-contrast
Comparison: December 15, 2015

CLINICAL DATA: Respiratory failure

EXAM:
PORTABLE CHEST 1 VIEW

[AP]
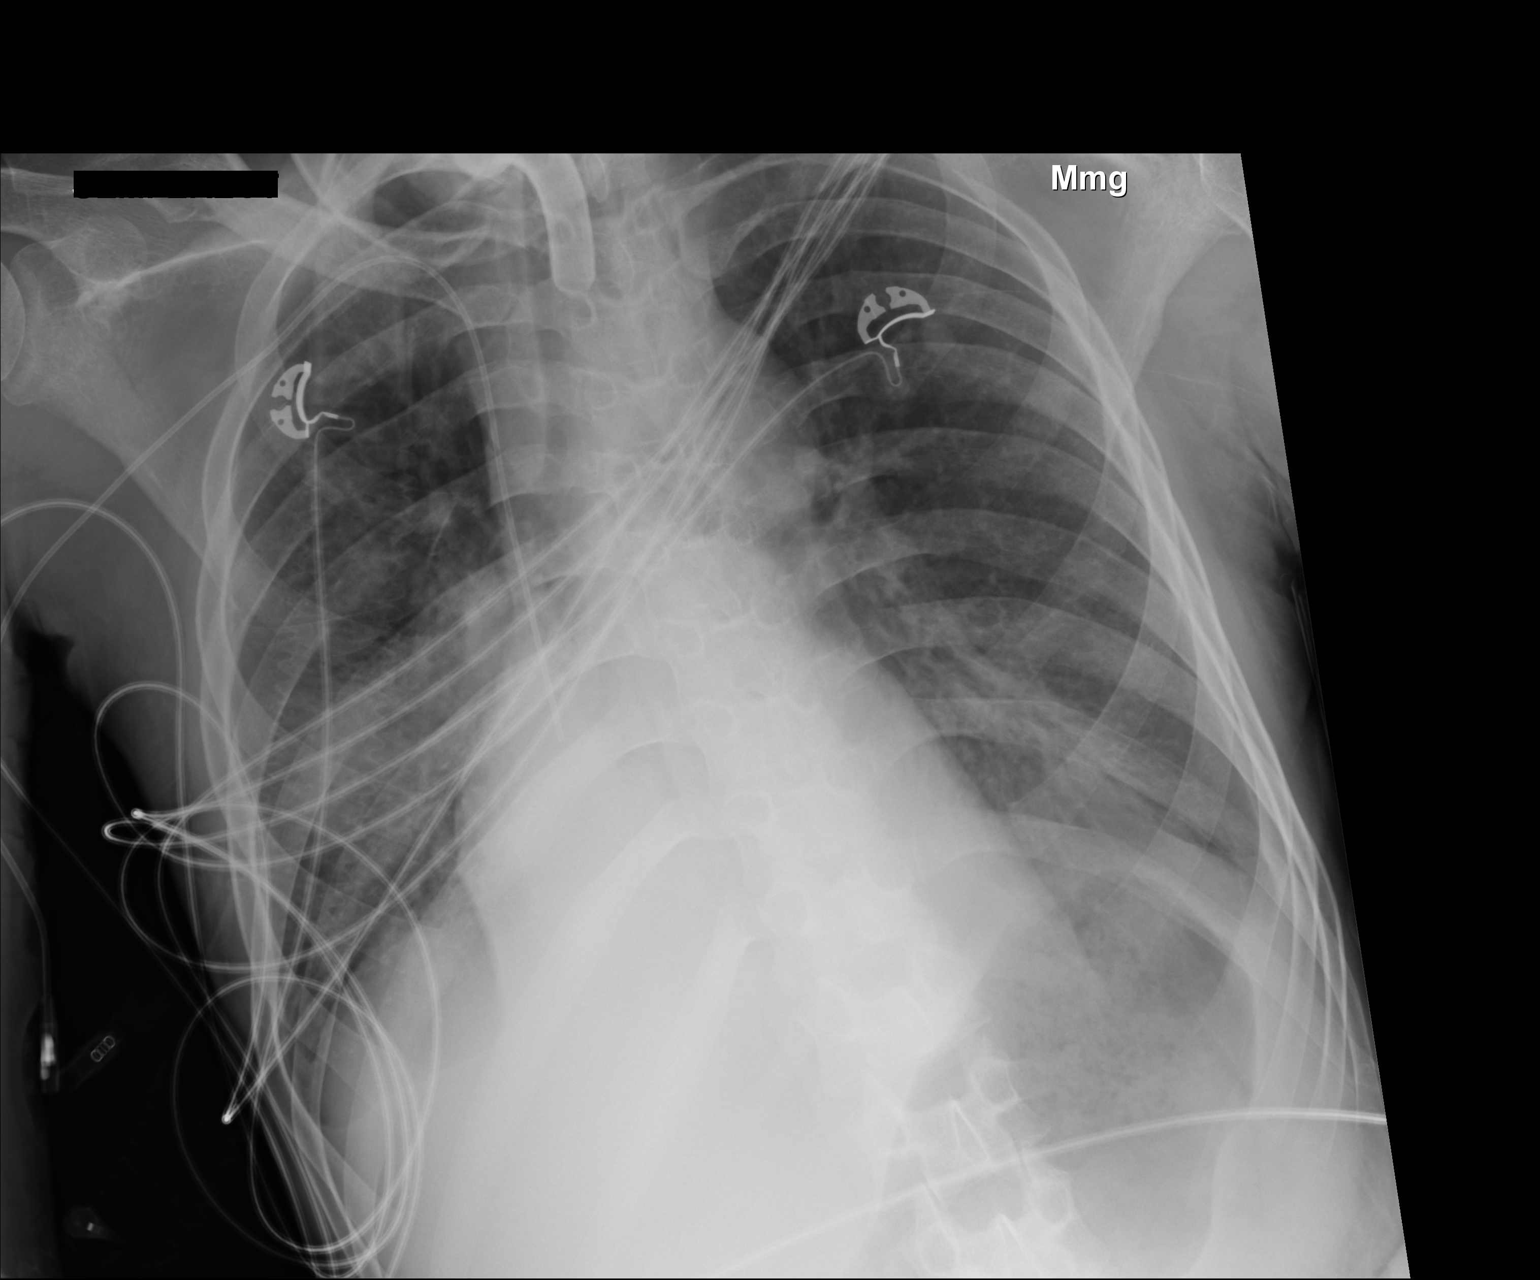

[1 of 1 positions shown; findings below may reference images not displayed]

FINDINGS: Tracheostomy catheter tip is 4.8 cm above the carina. Central
catheter tip is in the superior vena cava near the cavoatrial
junction. There is no appreciable pneumothorax. There is airspace
consolidation in the medial right base with small right effusion.
Left lung is clear. Heart is upper normal in size with pulmonary
vascularity within normal limits. No adenopathy. There is lumbar
levoscoliosis.
IMPRESSION: Tube and catheter positions as described without pneumothorax.
Consolidation medial right base with small right effusion. Left lung
clear. No change in cardiac silhouette.

## 2018-03-01 IMAGING — CR DG CHEST 1V PORT
1 series · 1 of 1 positions shown · non-contrast
Comparison: 12/20/2015

CLINICAL DATA: Dyspnea.  Quadriplegia.

EXAM:
PORTABLE CHEST 1 VIEW

[AP]
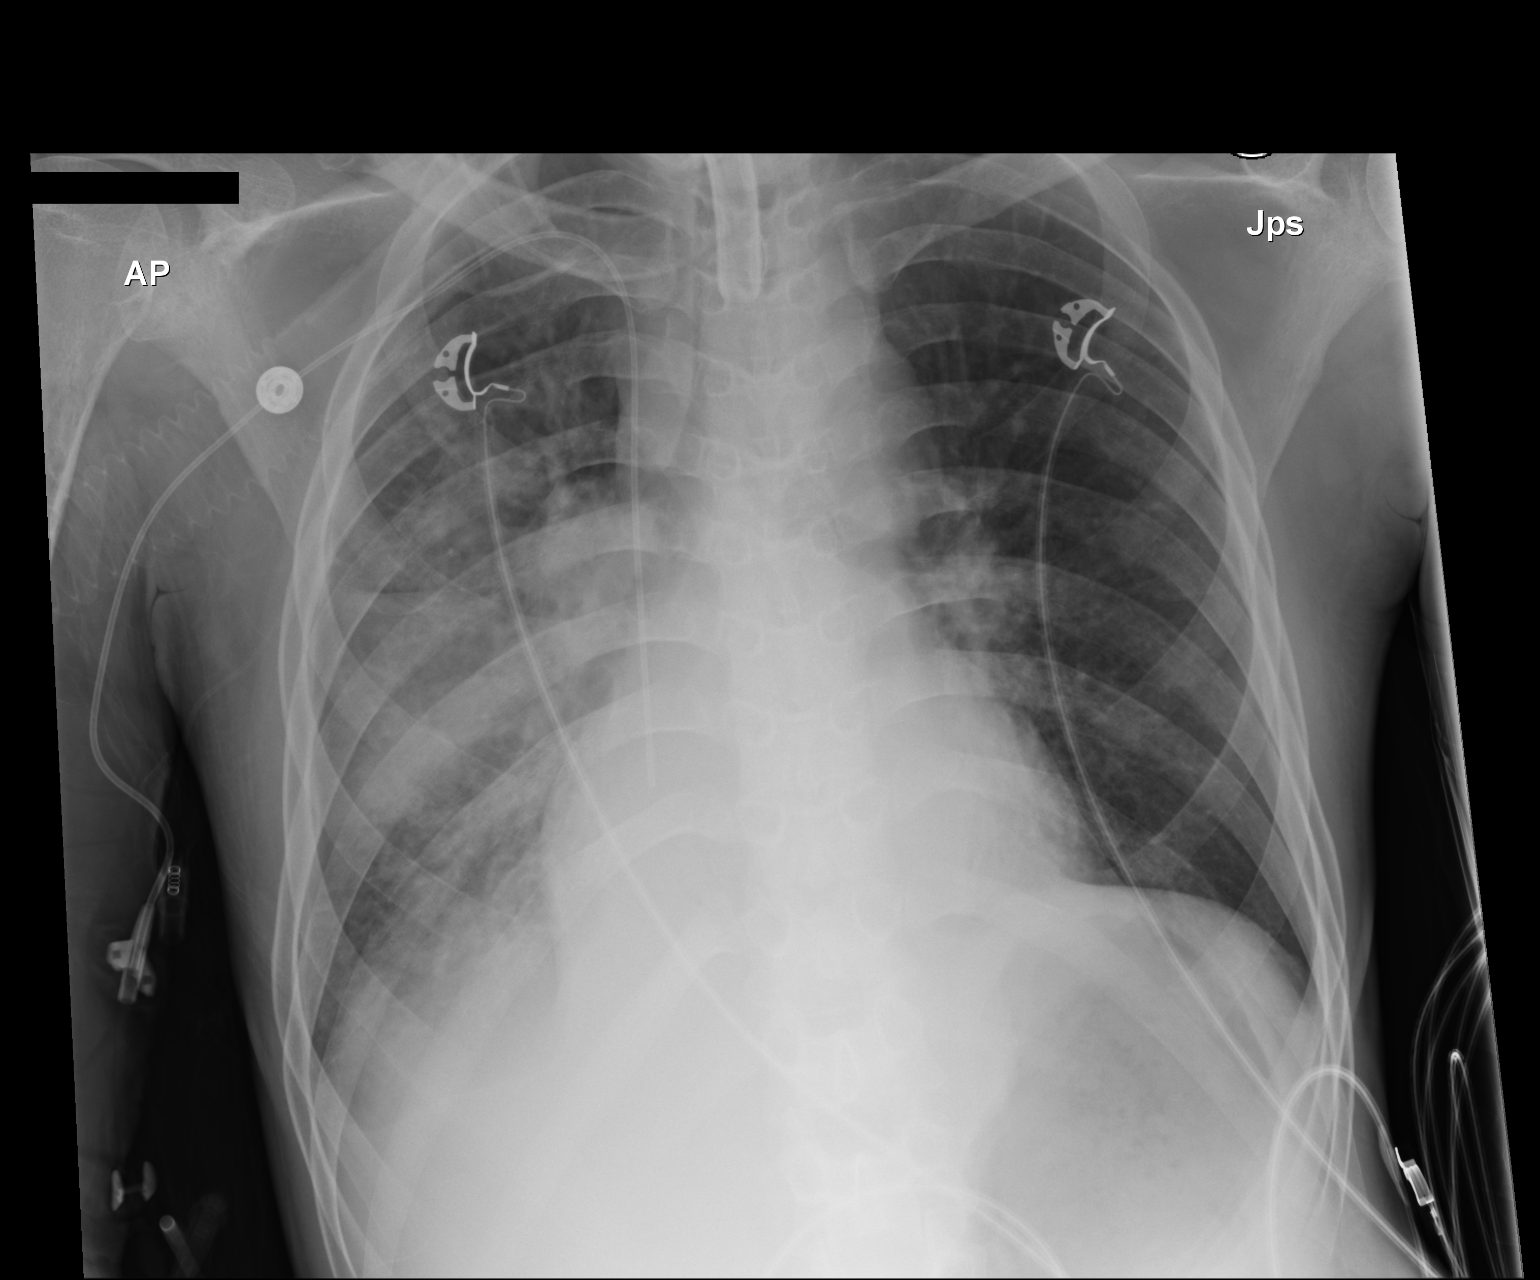

[1 of 1 positions shown; findings below may reference images not displayed]

FINDINGS: Support apparatus stable. Increased right perihilar and basilar
airspace opacity.

The patient is rotated to the right on today's radiograph, reducing
diagnostic sensitivity and specificity. Deformity of the right
distal clavicle, stable.

Improved aeration at the left lung base.
IMPRESSION: 1. Significant worsening of airspace opacity in the right mid lung
and right lung base, nonspecific but potentially from pneumonia.
However, there is some improvement in the airspace opacity at the
left lung base.

## 2018-03-02 IMAGING — CR DG CHEST 1V PORT
1 series · 1 of 1 positions shown · non-contrast
Comparison: 12/22/2015

CLINICAL DATA: Pneumonia

EXAM:
PORTABLE CHEST 1 VIEW

[AP]
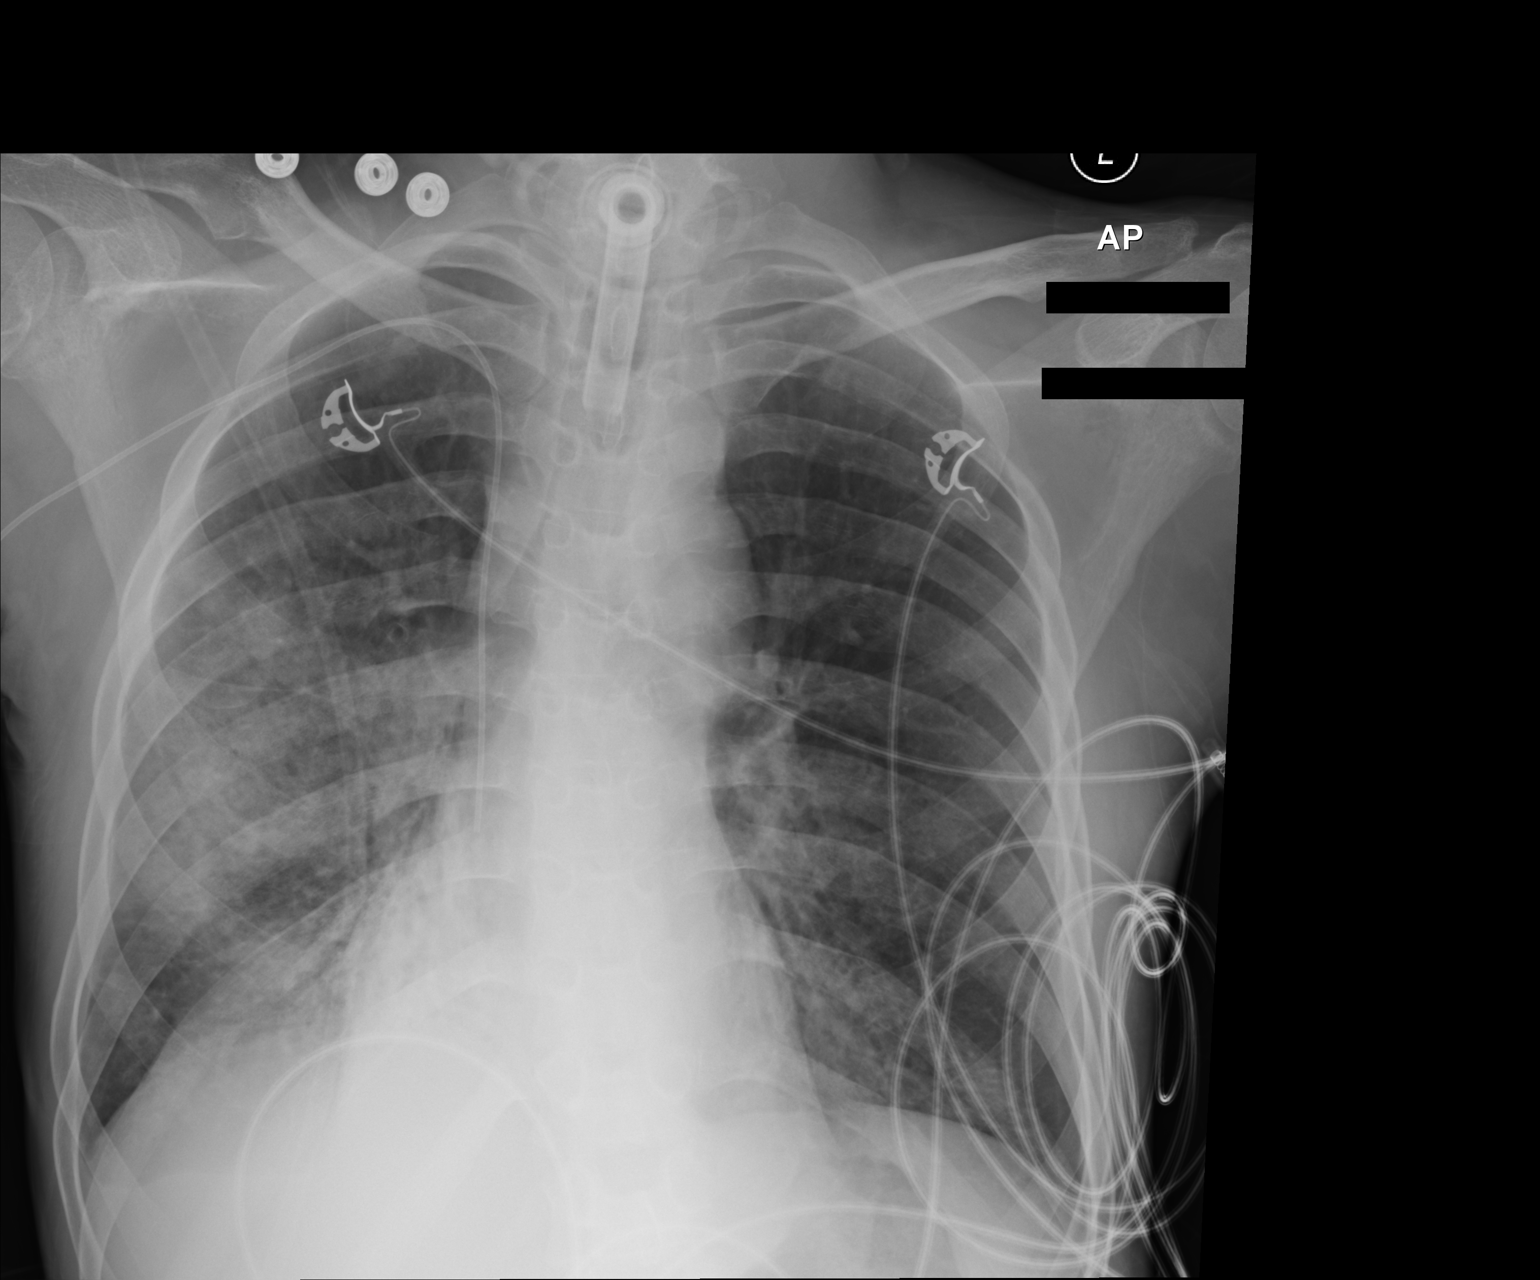

[1 of 1 positions shown; findings below may reference images not displayed]

FINDINGS: Right PICC and tracheostomy tube are stable. Bilateral airspace
disease is stable. There is partial collapse of the right lower lobe
which is stable. No pneumothorax.
IMPRESSION: Stable bilateral airspace disease. Stable partial collapse of the
right lower lobe.

## 2018-03-27 IMAGING — CR DG CHEST 1V PORT
1 series · 1 of 1 positions shown · non-contrast
Comparison: December 29, 2015

CLINICAL DATA: Shortness of Breath

EXAM:
PORTABLE CHEST 1 VIEW

[AP]
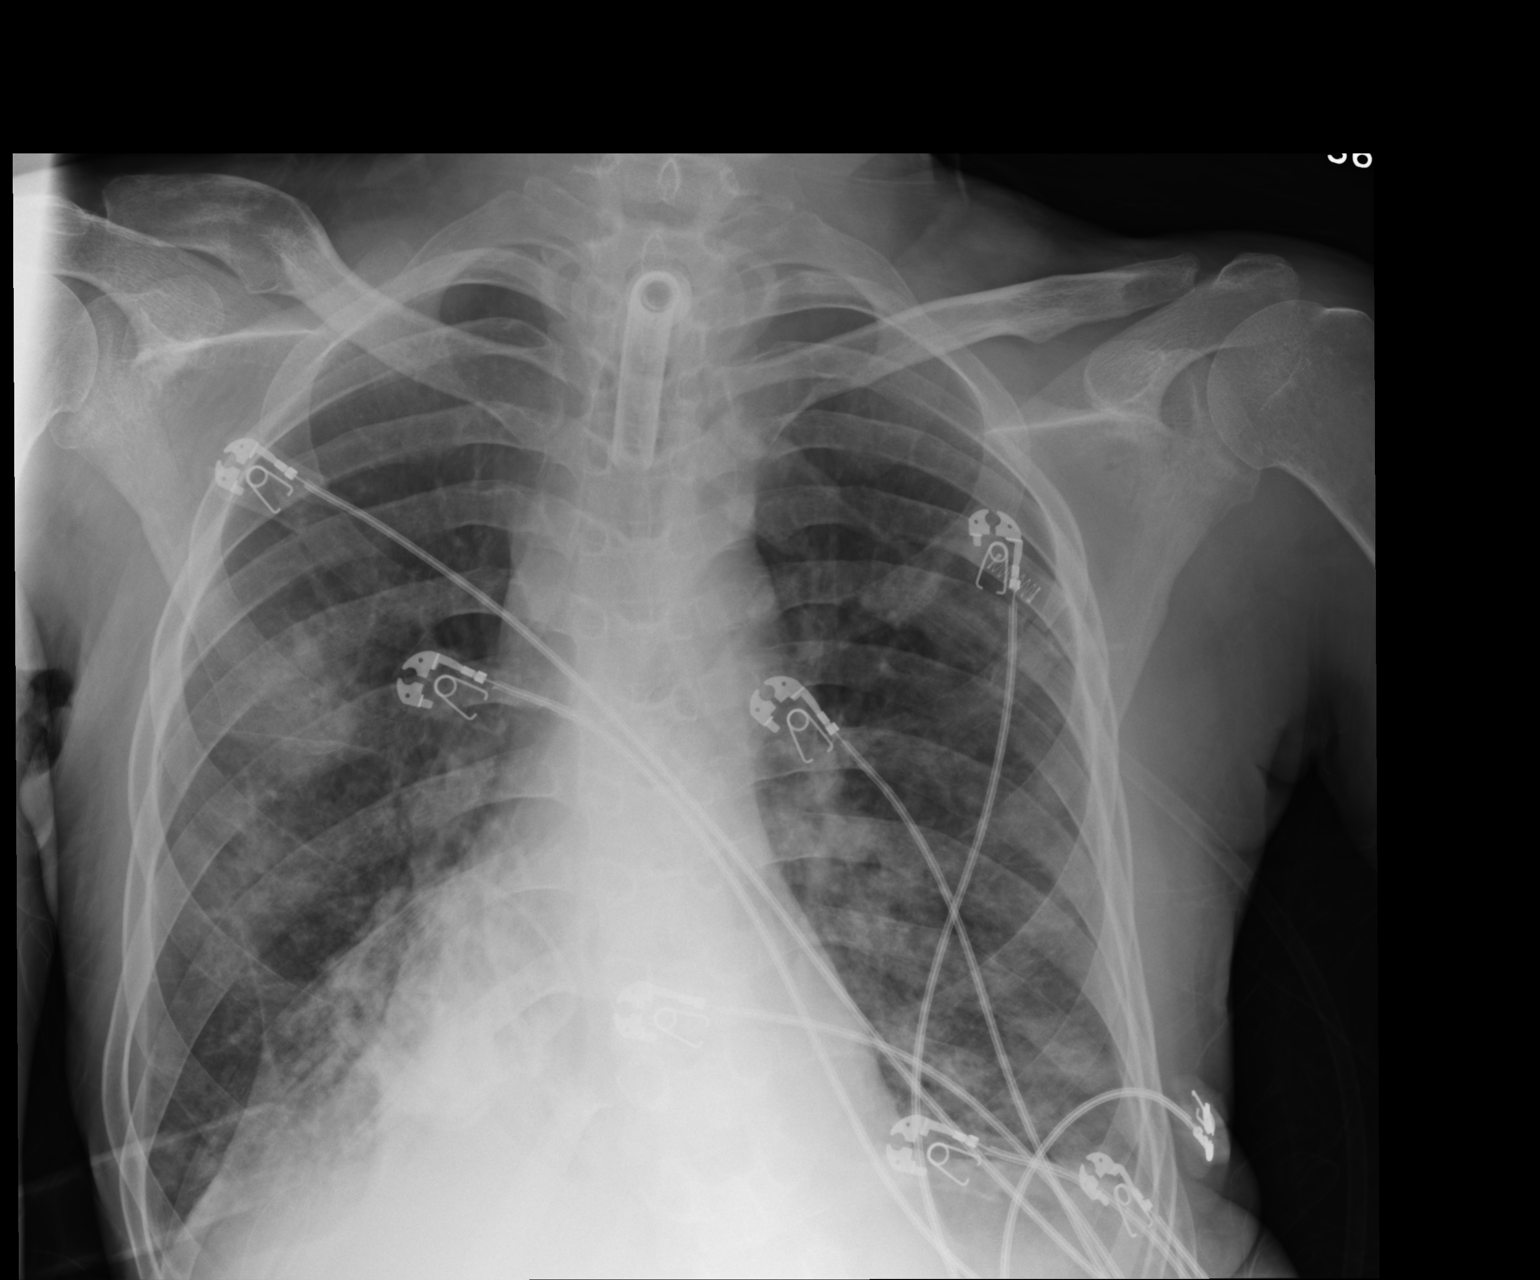

[1 of 1 positions shown; findings below may reference images not displayed]

FINDINGS: Tracheostomy catheter tip is 5.1 cm above the carina. No
pneumothorax. There is focal airspace consolidation in the right mid
lung as well as in both bases, likely multifocal pneumonia. Heart
size is within normal limits. Pulmonary vascularity is normal. No
adenopathy. There is evidence of old trauma with remodeling in the
lateral right clavicle.
IMPRESSION: Findings most likely due to multifocal pneumonia. Aspiration is a
differential consideration. Tracheostomy as described without
pneumothorax. Stable cardiac silhouette.

## 2018-03-28 IMAGING — CR DG CHEST 1V PORT
1 series · 1 of 1 positions shown · non-contrast
Comparison: No prior .

CLINICAL DATA: Respiratory failure.

EXAM:
PORTABLE CHEST 1 VIEW

[AP]
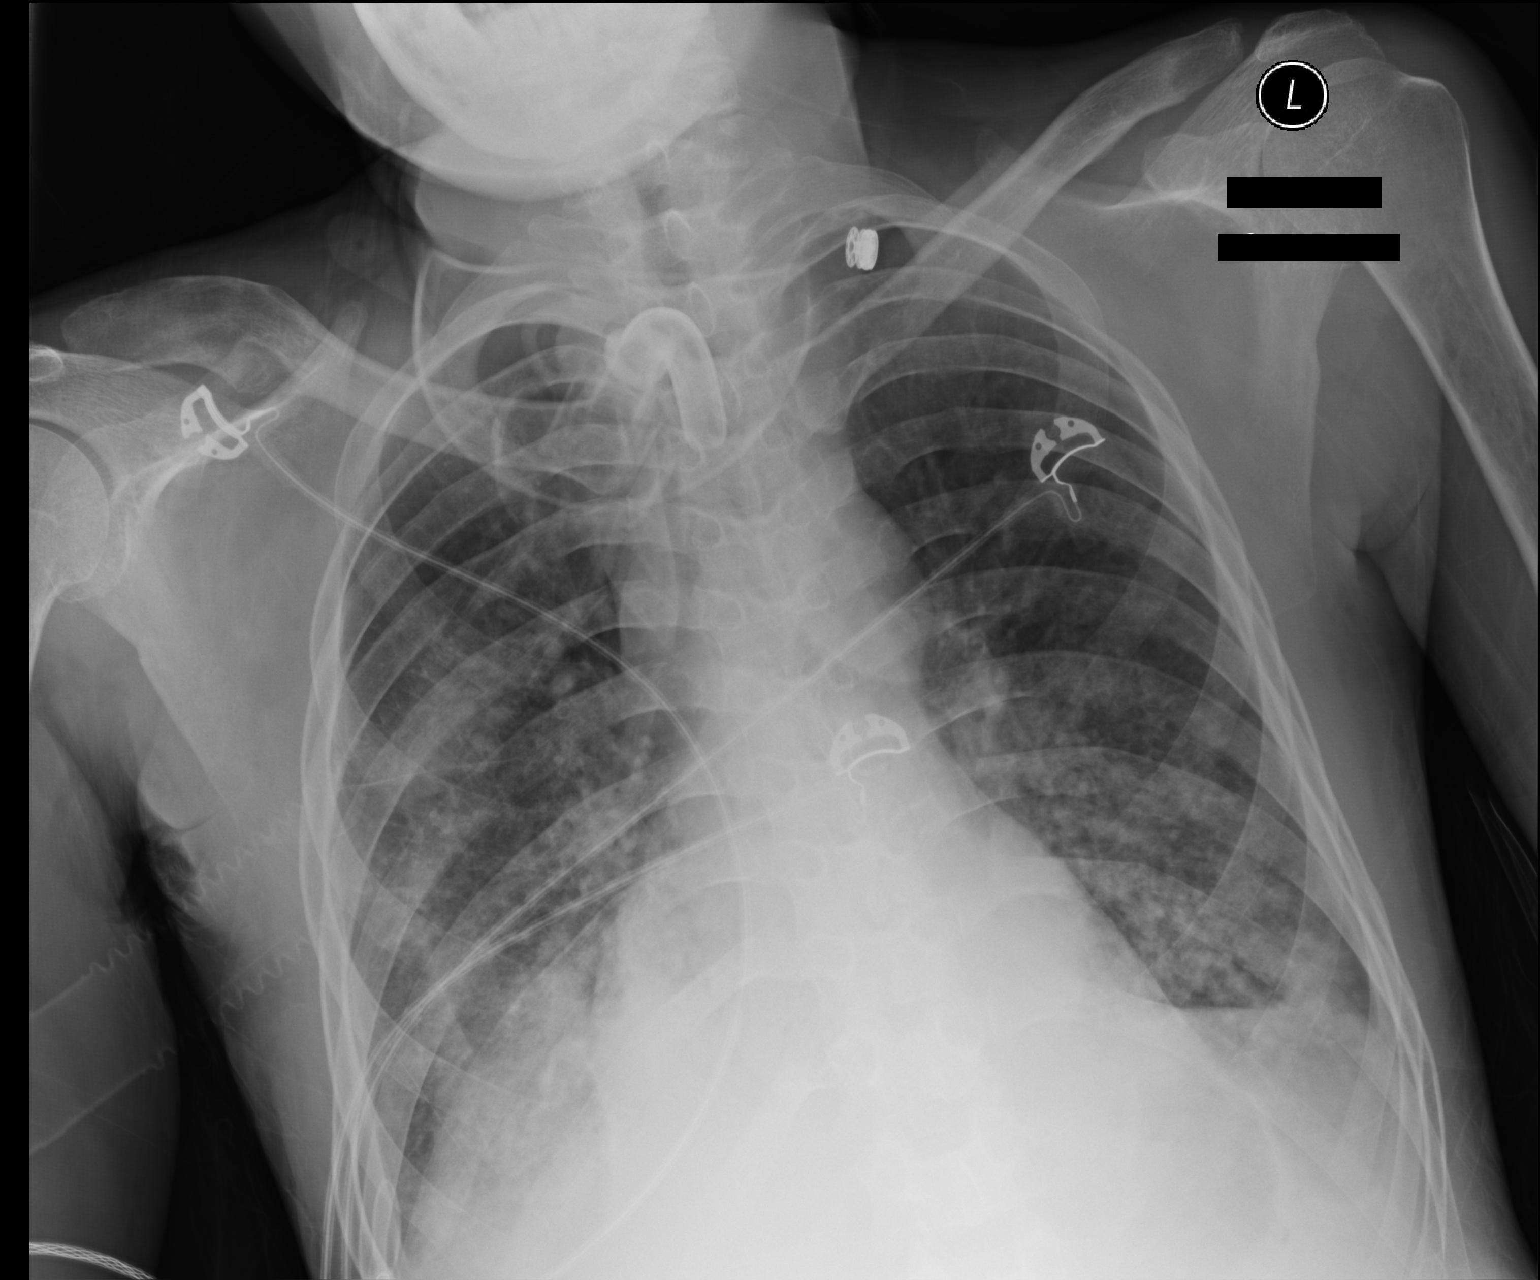

[1 of 1 positions shown; findings below may reference images not displayed]

FINDINGS: Tracheostomy tube noted in good anatomic position. Stable
cardiomegaly. Normal pulmonary vascularity. Stable diffuse bilateral
nodular pulmonary infiltrates, no interim clearing. Low lung
volumes. No pleural effusion or pneumothorax.
IMPRESSION: 1.  Tracheostomy tube in stable position.

2. Persistent unchanged diffuse prominent nodular bilateral
pulmonary infiltrates.

## 2018-03-29 IMAGING — DX DG CHEST 1V PORT
1 series · 1 of 1 positions shown · non-contrast
Comparison: 01/18/2016

CLINICAL DATA: [id number] ([id number]) - Acute respiratory failure ([name])
Chronic Tracheostomy, fever, leukocytosis

EXAM:
PORTABLE CHEST 1 VIEW

[chest ap]
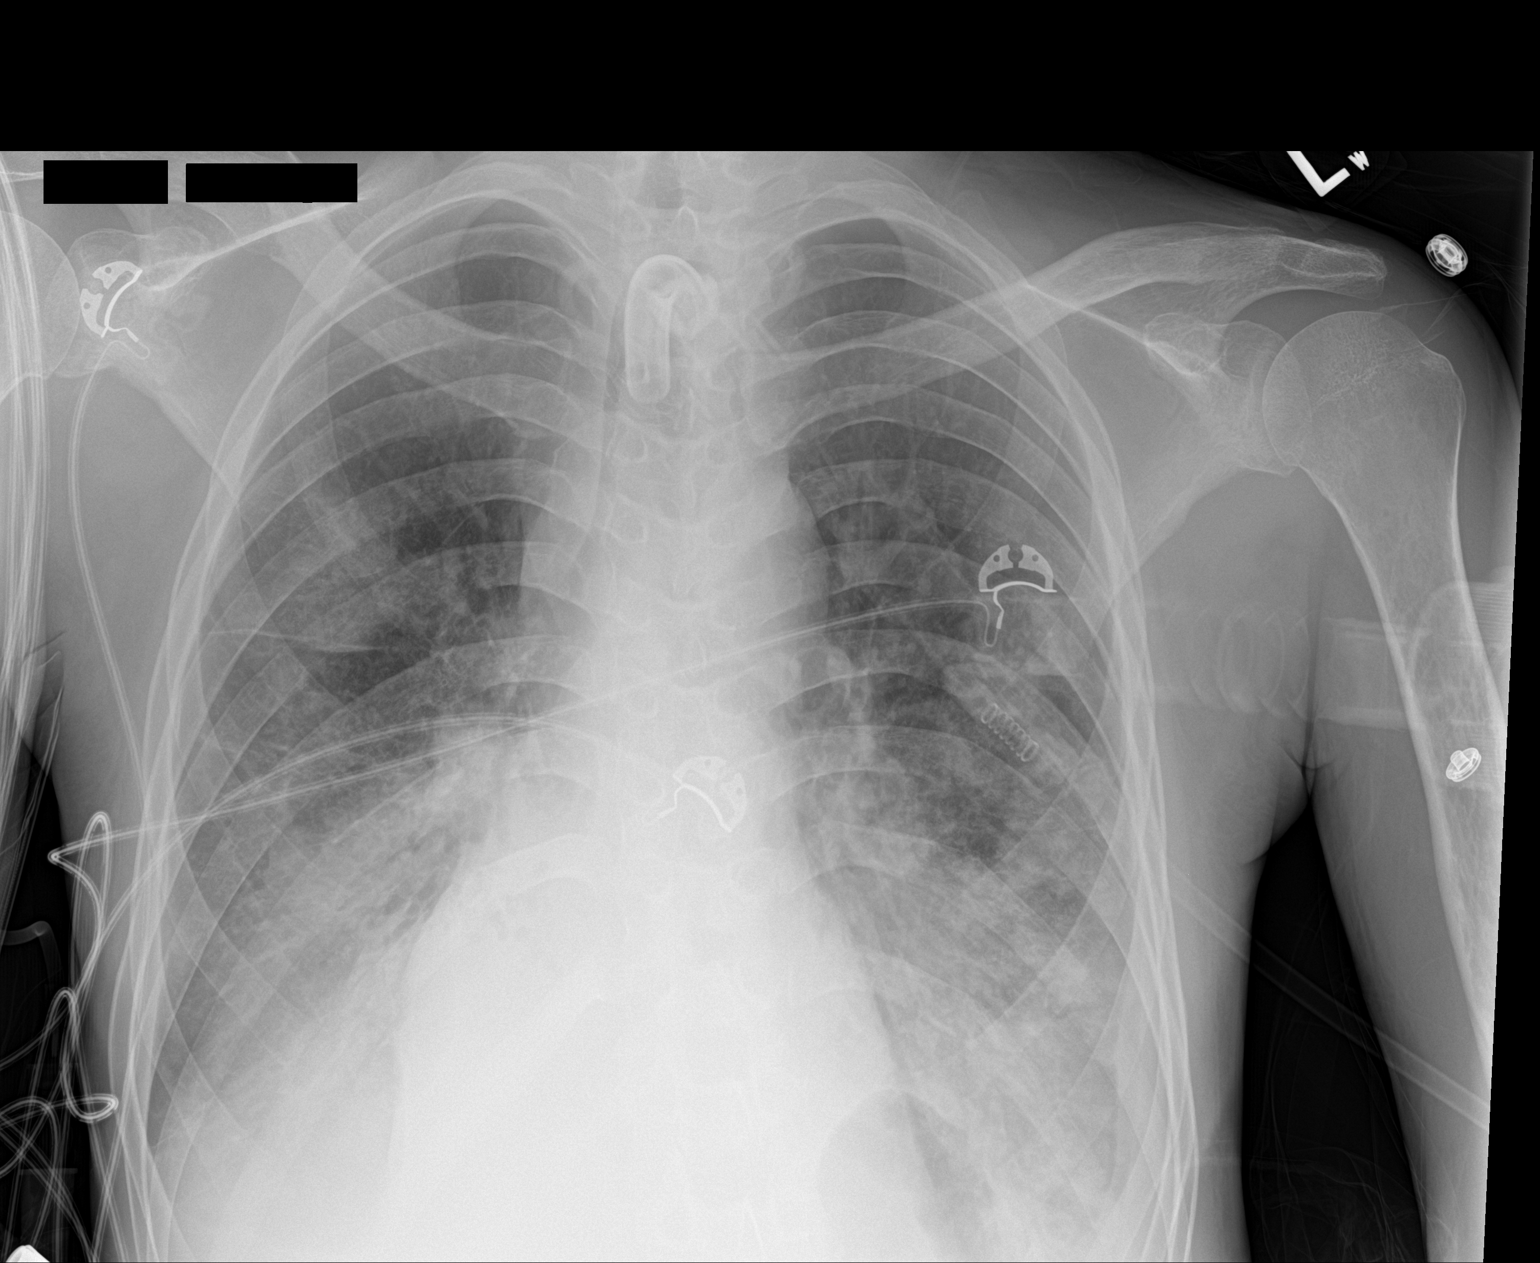

[1 of 1 positions shown; findings below may reference images not displayed]

FINDINGS: Irregular mid to lower lung interstitial and hazy airspace
opacities, more confluent at the bases, have mildly increased from
the prior study, which may be due to an increase in small bilateral
pleural effusions.

No pneumothorax.

Tracheostomy tube is well positioned and stable.
IMPRESSION: 1. Mild worsening of lung aeration at the lung bases. Suspect that
increases and small bilateral pleural effusions since the previous
day's study.
2. Persistent bilateral mid to lower lung zone interstitial and
airspace opacities consistent with pneumonia.

## 2018-03-30 IMAGING — CR DG CHEST 1V PORT
1 series · 1 of 1 positions shown · non-contrast
Comparison: Portable chest x-ray January 19, 2016

CLINICAL DATA: Healthcare associated pneumonia, diabetes, former
smoker.

EXAM:
PORTABLE CHEST 1 VIEW

[AP]
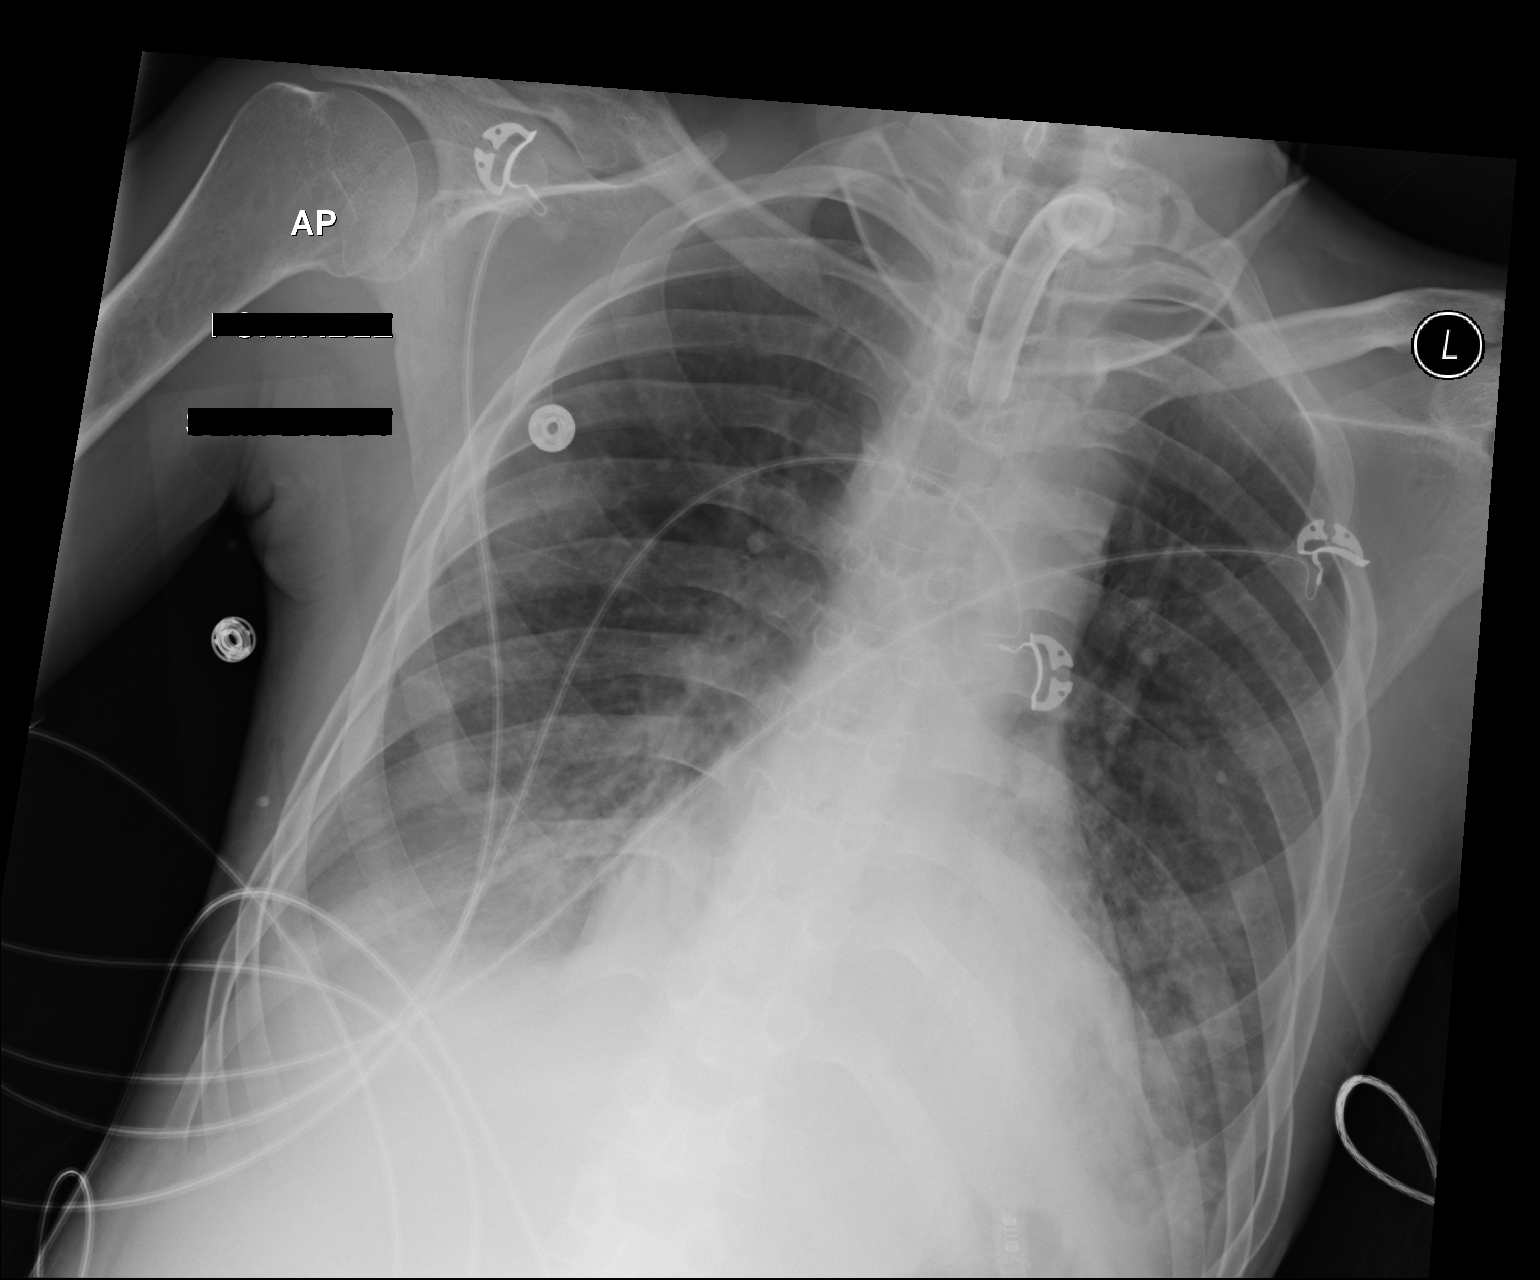

[1 of 1 positions shown; findings below may reference images not displayed]

FINDINGS: The lungs are well-expanded. There are bibasilar infiltrates and
small bilateral pleural effusions. The interstitial markings remain
increased in the inferior aspects of both lungs but have improved in
the upper lung zones. The cardiac silhouette is top-normal in size.
The pulmonary vascularity is not clearly engorged. The tracheostomy
appliance tip projects at the inferior margin of the clavicular
heads.
IMPRESSION: There has been mild interval improvement in pulmonary interstitial
edema or interstitial pneumonia. Persistent bibasilar atelectasis or
pneumonia with small bilateral pleural effusions.

## 2018-06-28 IMAGING — DX DG CHEST 2V
3 series · 3 of 3 positions shown · non-contrast
Comparison: Chest x-ray of 01/20/2016

CLINICAL DATA: Quadriplegia, possible aspiration, former smoking
history, diabetes

EXAM:
CHEST  2 VIEW

[chest lat]
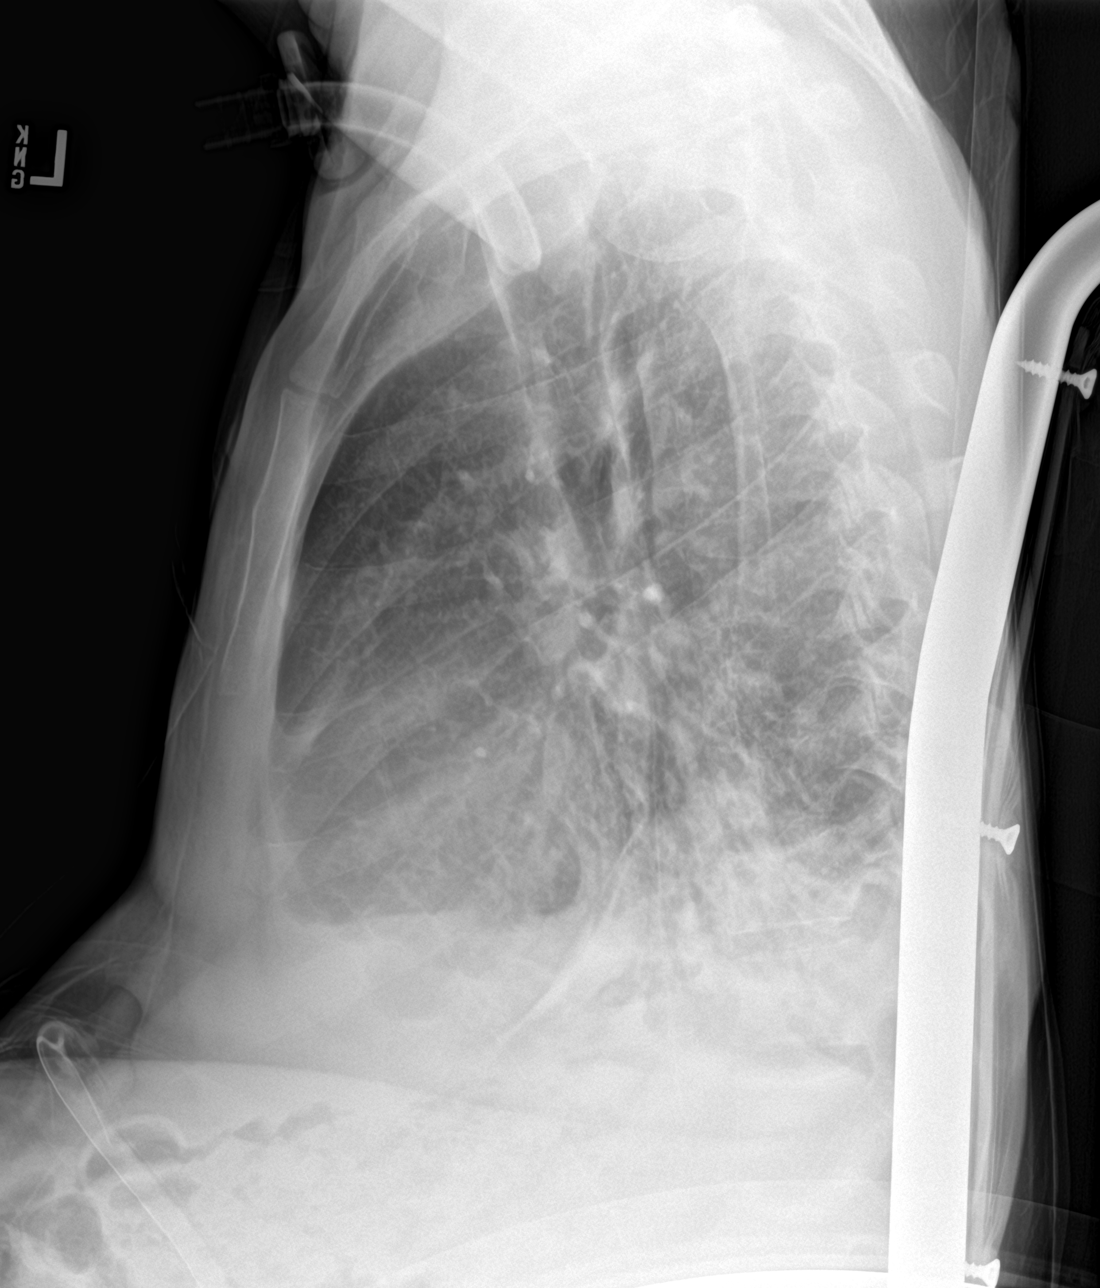

[chest ap (1 of 2)]
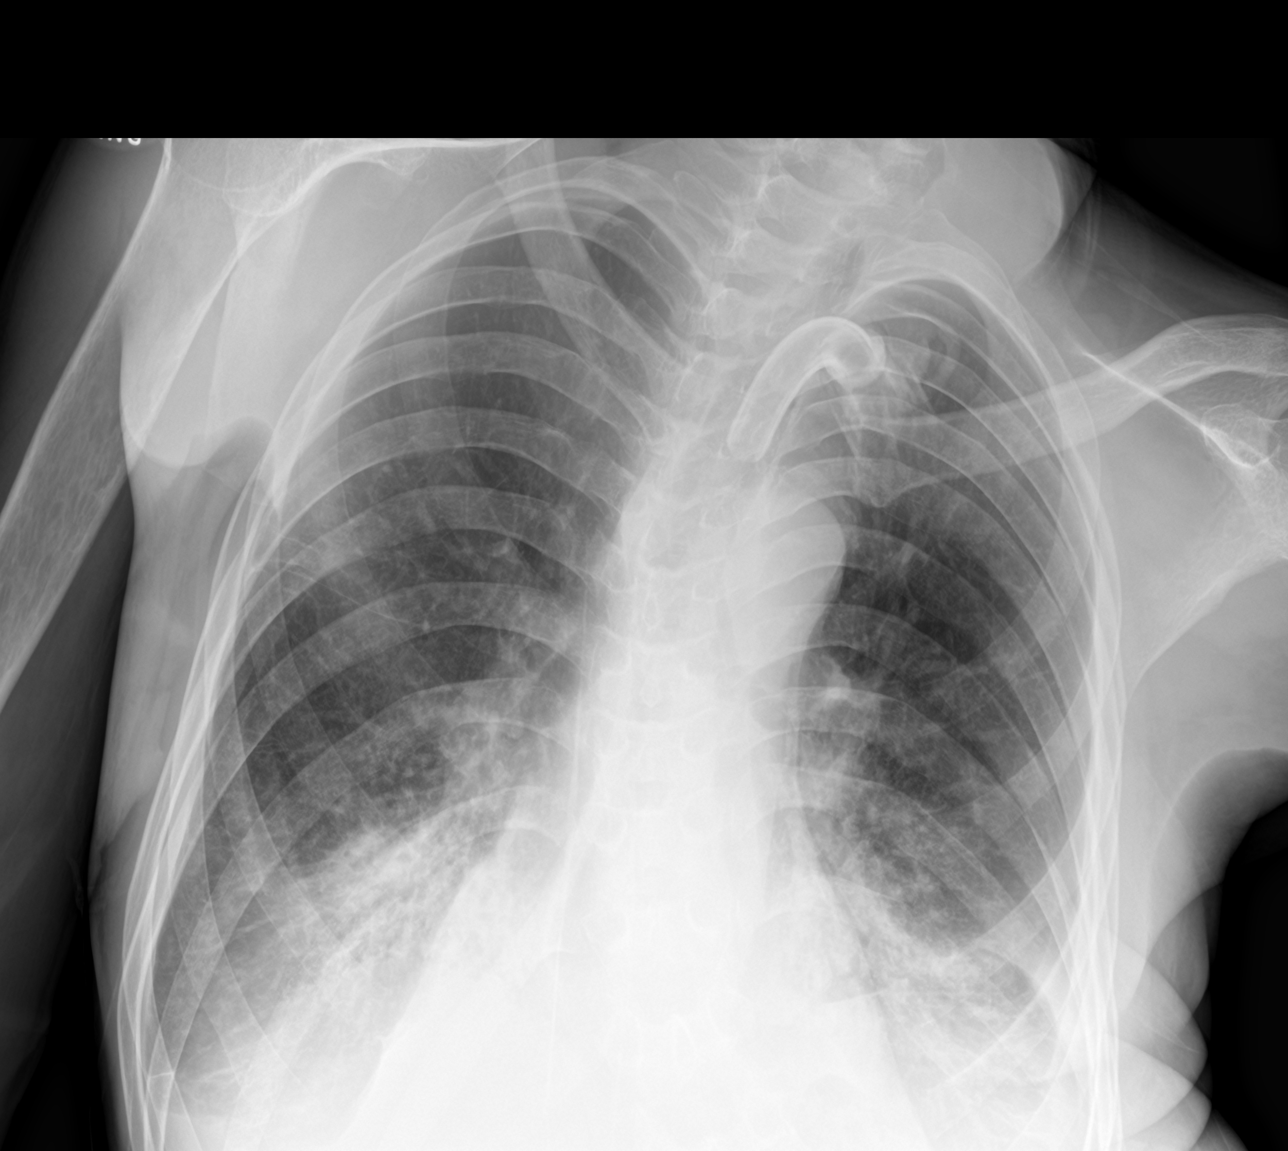

[chest ap (2 of 2)]
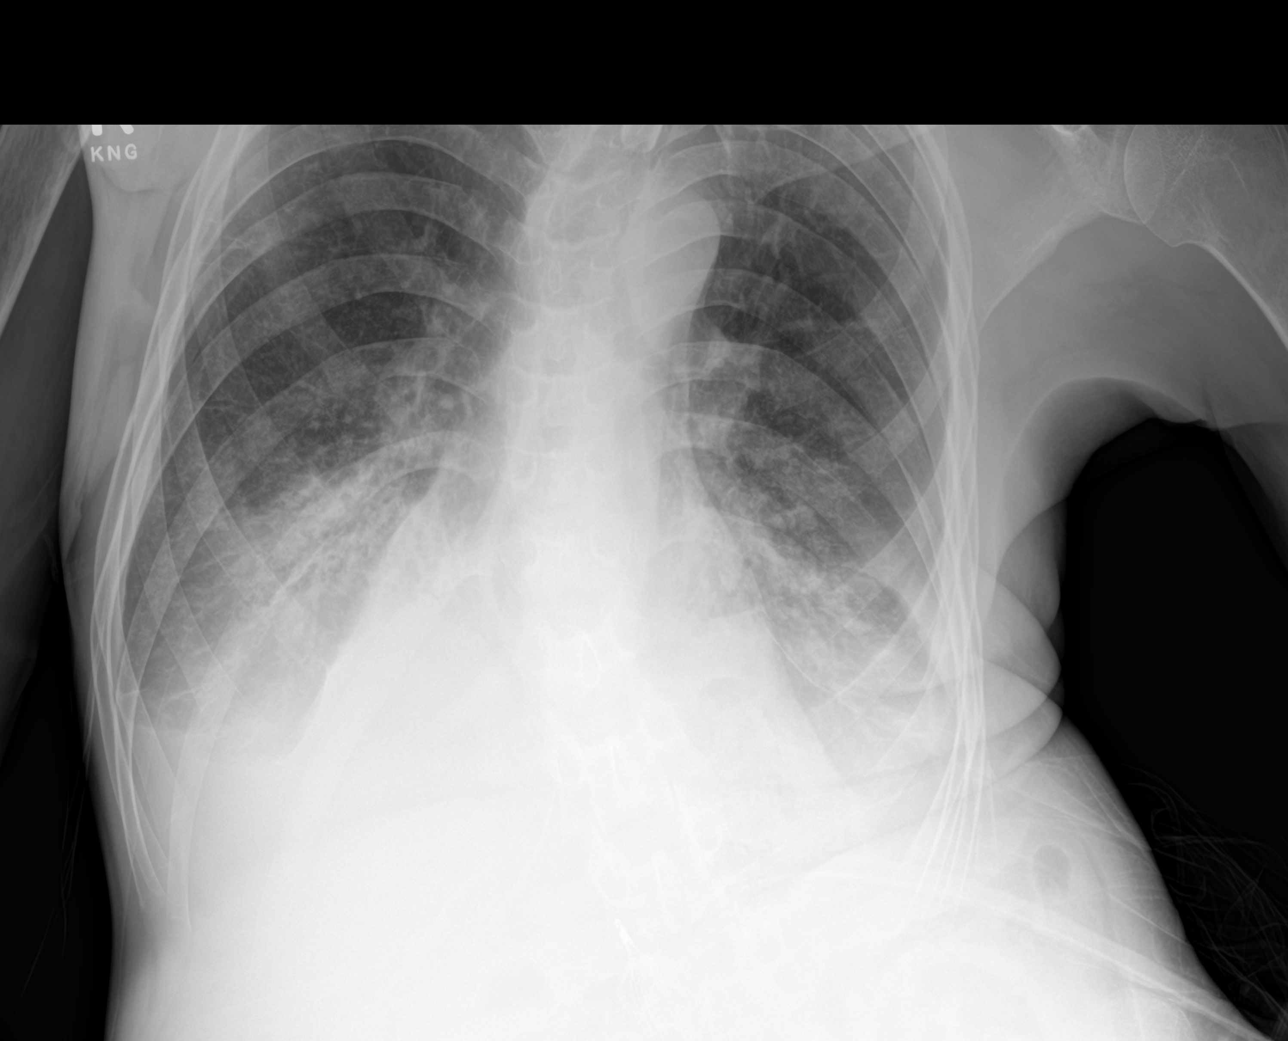

[3 of 3 positions shown; findings below may reference images not displayed]

FINDINGS: There are opacities at both lung bases which could represent lower
lobe pneumonia and bilateral pleural effusions. Mild superimposed
interstitial edema cannot be excluded. Mediastinal and hilar
contours are unremarkable. The heart is mildly enlarged.
Tracheostomy remains.
IMPRESSION: 1. Bibasilar opacities most consistent with pneumonia and effusions.
Cannot exclude mild superimposed edema.
2. Tracheostomy remains.

## 2018-09-13 IMAGING — MR MR PELVIS WO/W CM
6 of 9 series · 20 of 48 positions shown · IV contrast (Y)
Comparison: 04/28/2015

CLINICAL DATA: Ruling out osteomyelitis. Multiple decubitus ulcers
on posterior portion of pelvic area. Quadriplegic.

EXAM:
MRI PELVIS WITHOUT AND WITH CONTRAST
TECHNIQUE: Multiplanar multisequence MR imaging of the pelvis was performed
both before and after administration of intravenous contrast.
CONTRAST:  9mL MULTIHANCE GADOBENATE DIMEGLUMINE 529 MG/ML IV SOLN

[Series 3: T1 · coronal · 5.0mm · 0.70mm/px · 3 of 24 slices shown (1 of 2)]
[im 1/24]
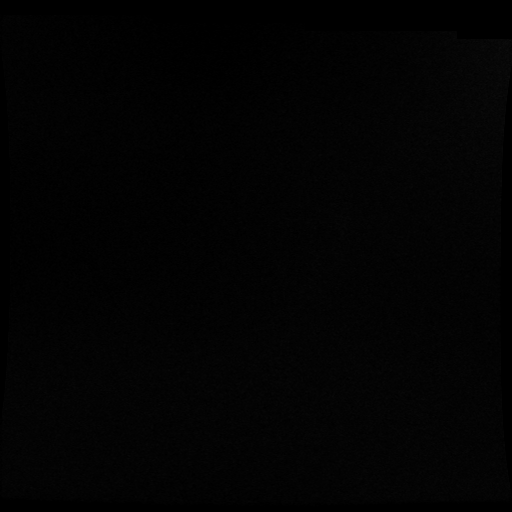
[im 12/24]
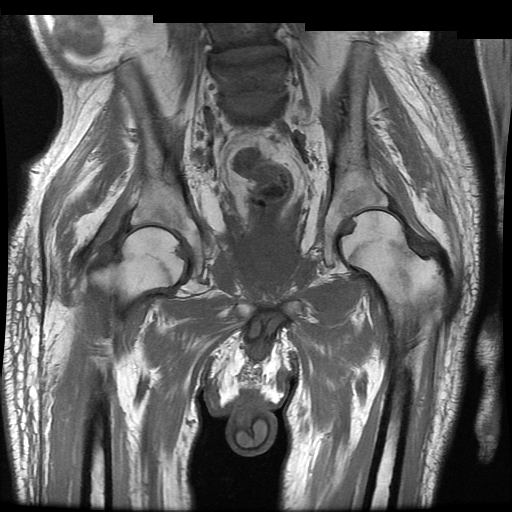
[im 24/24]
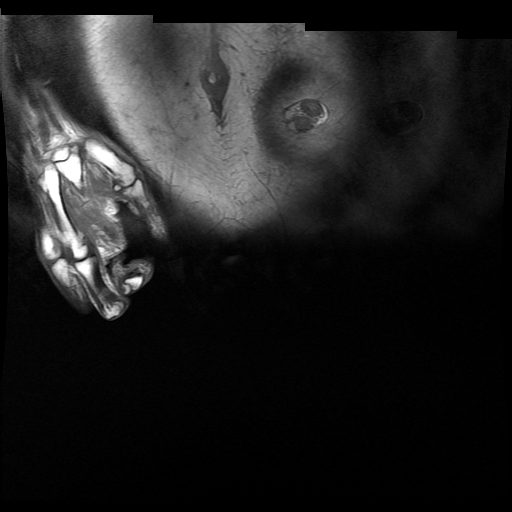

[Series 4: cor fse ir · coronal · 5.0mm · 0.70mm/px · 3 of 24 slices shown]
[im 1/24]
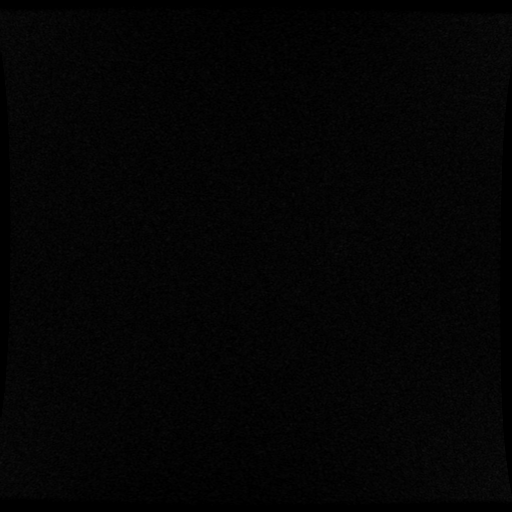
[im 12/24]
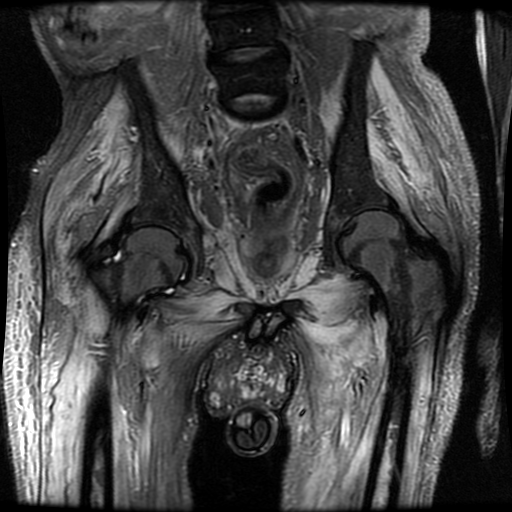
[im 24/24]
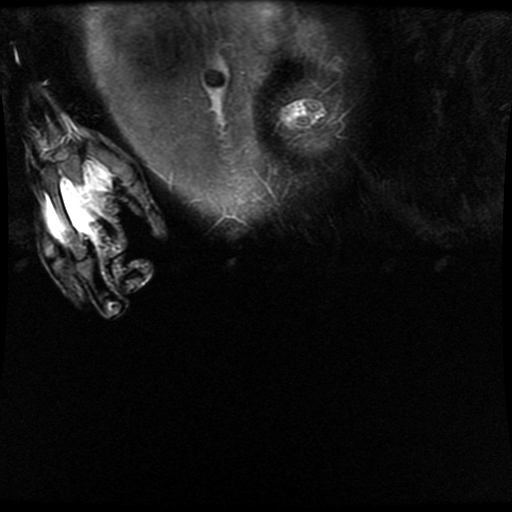

[Series 5: T1 · axial · 8.0mm · 0.70mm/px · z∈[-75,+175]mm · 4 of 26 slices shown (2 of 2)]
[im 1/26]
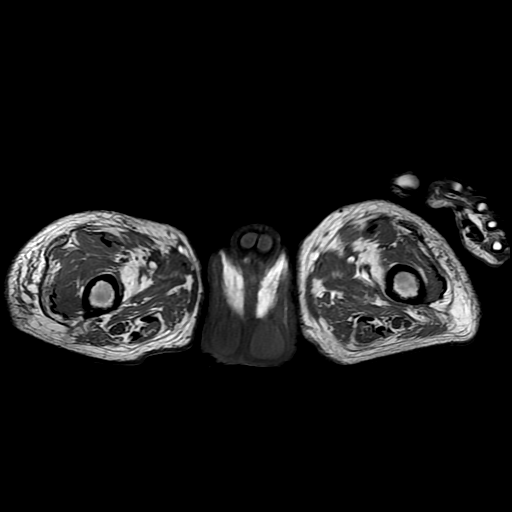
[im 9/26]
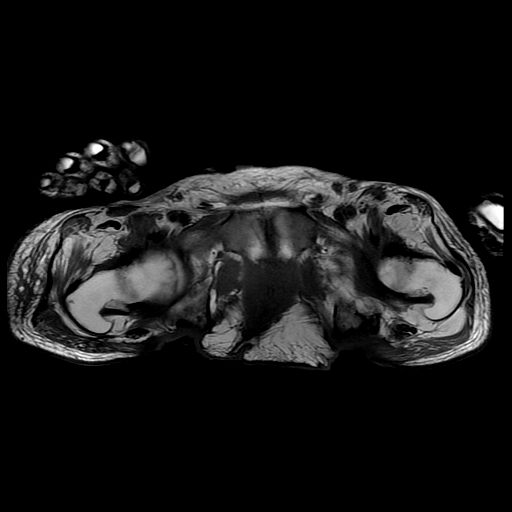
[im 17/26]
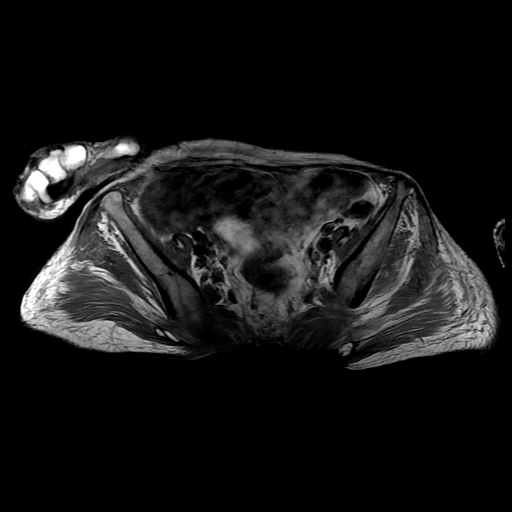
[im 26/26]
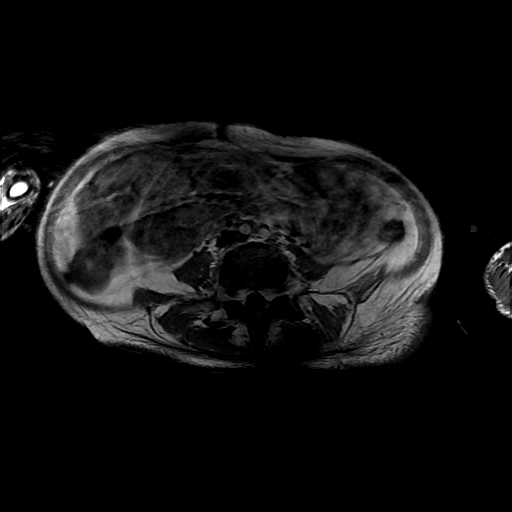

[Series 6: T1 fat-sat · axial · 8.0mm · 0.70mm/px · z∈[-75,+175]mm · 4 of 26 slices shown]
[im 1/26]
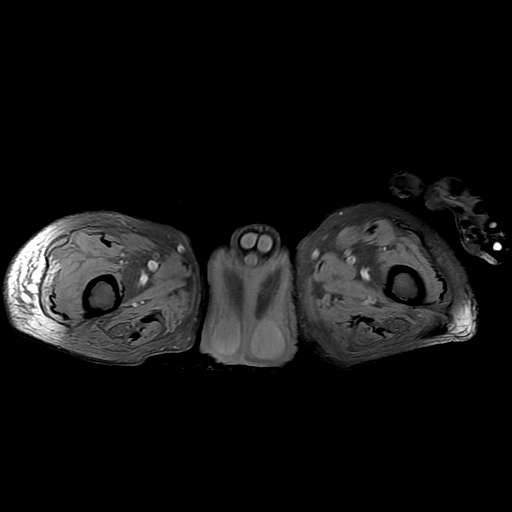
[im 9/26]
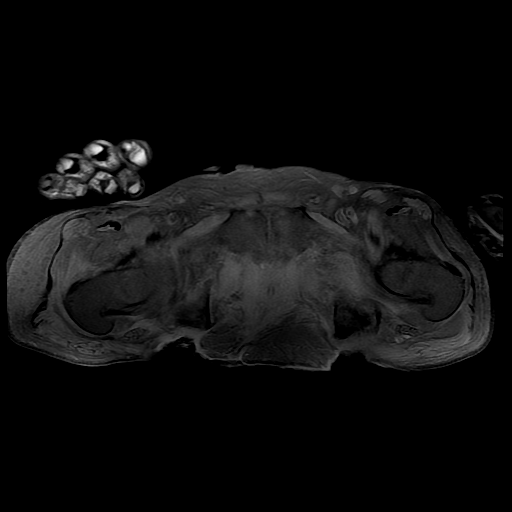
[im 17/26]
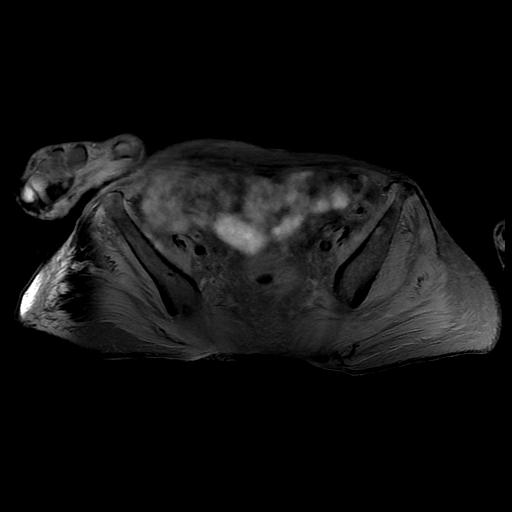
[im 26/26]
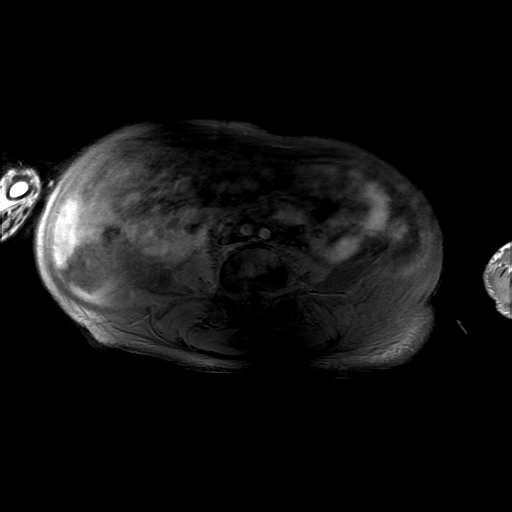

[Series 7: T2 fat-sat · axial · 8.0mm · 0.70mm/px · z∈[-75,+175]mm · 4 of 26 slices shown (1 of 2)]
[im 1/26]
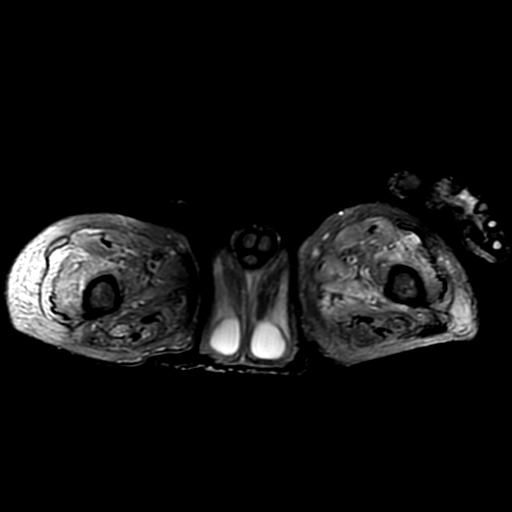
[im 9/26]
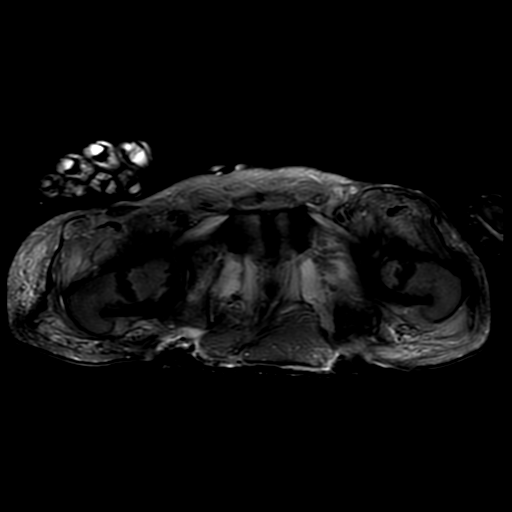
[im 17/26]
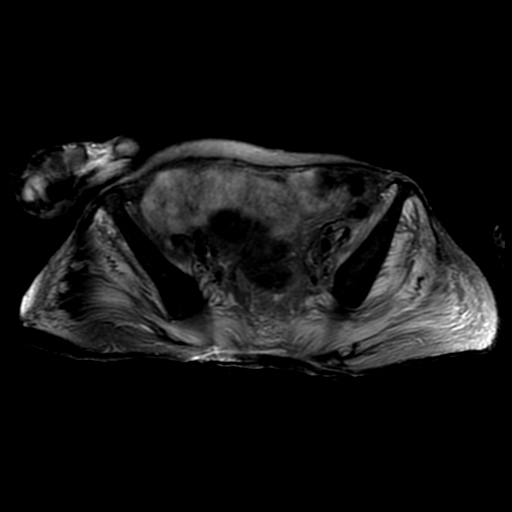
[im 26/26]
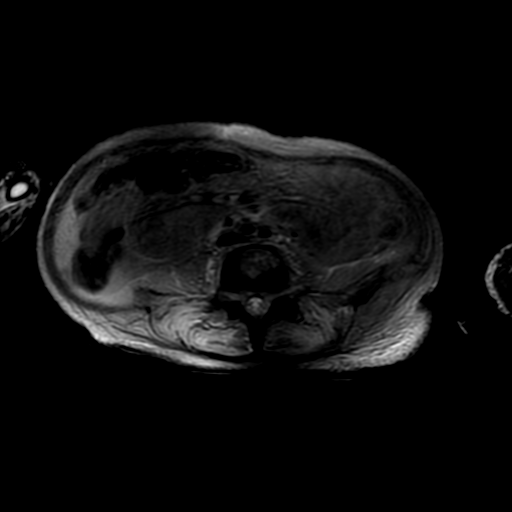

[Series 8: T2 fat-sat · sagittal · 4.0mm · 1.17mm/px · 2 of 69 slices shown (2 of 2)]
[im 1/69]
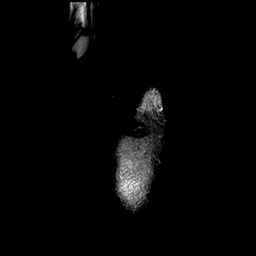
[im 14/69]
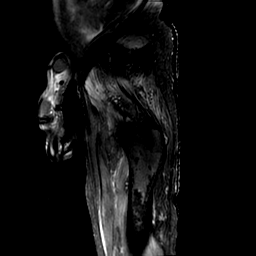

[20 of 48 positions shown; findings below may reference images not displayed]

FINDINGS: Bones/Joint/Cartilage

Decubitus ulcers over the sacrum ischial tuberosities bilaterally.
Prior ischial tuberosity debridement. No marrow signal abnormality
in the left ischial tuberosity. No marrow edema or bone destruction
of the right ischial tuberosity. Mild marrow edema in the right
posterior acetabulum with enhancement which may be reactive versus
secondary to mild osteomyelitis.

The sacrum distal to S2 is surgically absent. No cortical
destruction or bone marrow edema within the remainder of the sacrum.

No hip fracture, dislocation or avascular necrosis. Sacroiliac
joints are normal. Mild chronic L5 vertebral body height loss. Mild
bone marrow edema in the inferior endplate of L4 incompletely
visualized and characterized.

No hip joint effusion.  No SI joint effusion.

Muscles and Tendons
Diffuse muscle edema throughout the flexor, extensor, adductor and
gluteal musculature which may be neurogenic given the patient's
history of quadriplegia versus secondary to diffuse mild myositis
which is considered less likely.

Soft tissue
No fluid collection or hematoma. No soft tissue mass. Suprapubic
catheter is noted.
IMPRESSION: 1. Decubitus ulcers over the sacrum ischial tuberosities
bilaterally. Prior ischial tuberosity debridement bilaterally
without acute osteomyelitis. Mild marrow edema in the right
posterior acetabulum with enhancement which may be reactive versus
secondary to mild osteomyelitis.
2. The sacrum distal to S2 is surgically absent. No cortical
destruction or bone marrow edema within the remainder of the sacrum.
3. No fluid collection to suggest an abscess.

## 2018-09-19 IMAGING — XA IR GI TUBE CONTRAST INJECTION
3 series · 3 of 3 positions shown · non-contrast
Comparison: none

CLINICAL DATA: Current G tube malfunctioning. Concern of possible
retraction.

EXAM:
GASTROSTOMY CATHETER INJECTION UNDER FLUOROSCOPY
TECHNIQUE: The procedure, risks (including but not limited to bleeding,
infection, organ damage ), benefits, and alternatives were explained
to the patient. Questions regarding the procedure were encouraged
and answered. The patient understands and consents to the procedure.

[Series 2: care single · 1 of 1 slices shown (1 of 2)]
[im 1/1]
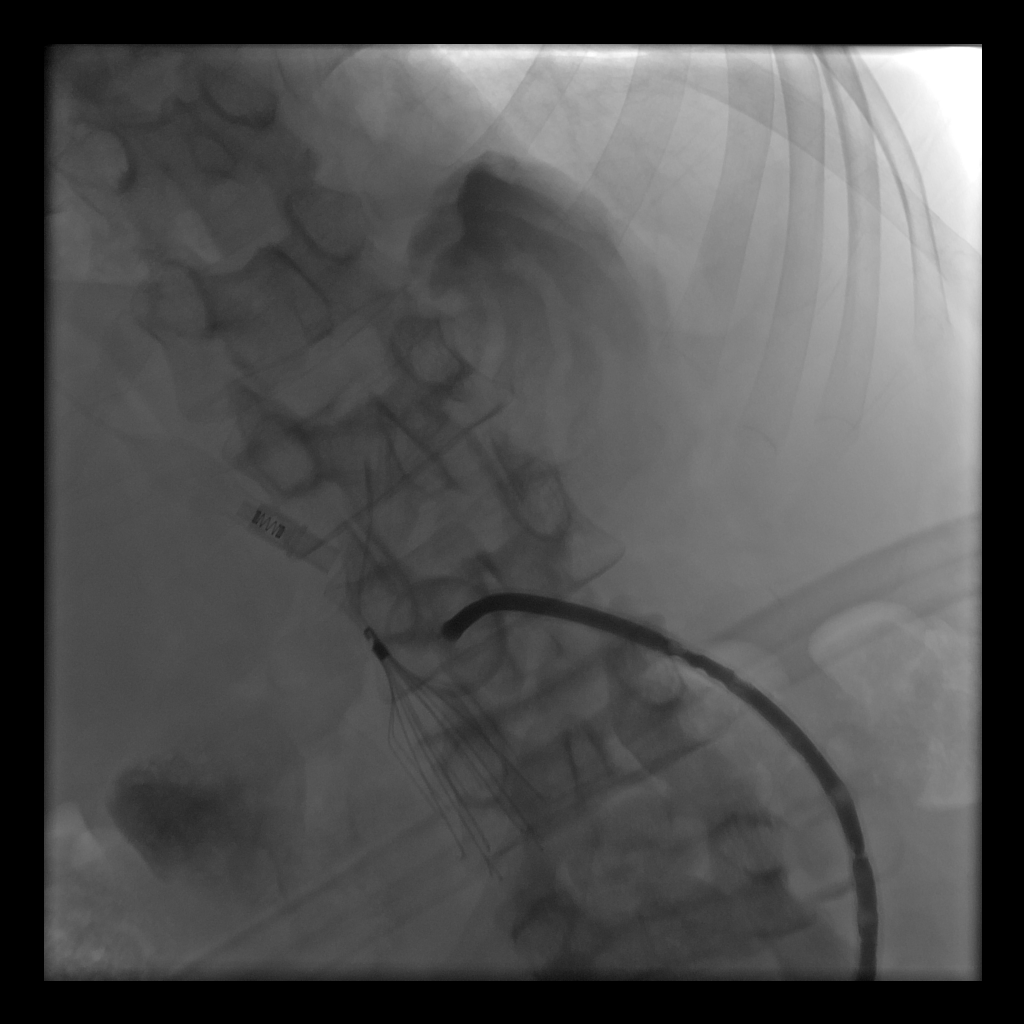

[Series 3: care single · 1 of 1 slices shown (2 of 2)]
[im 1/1]
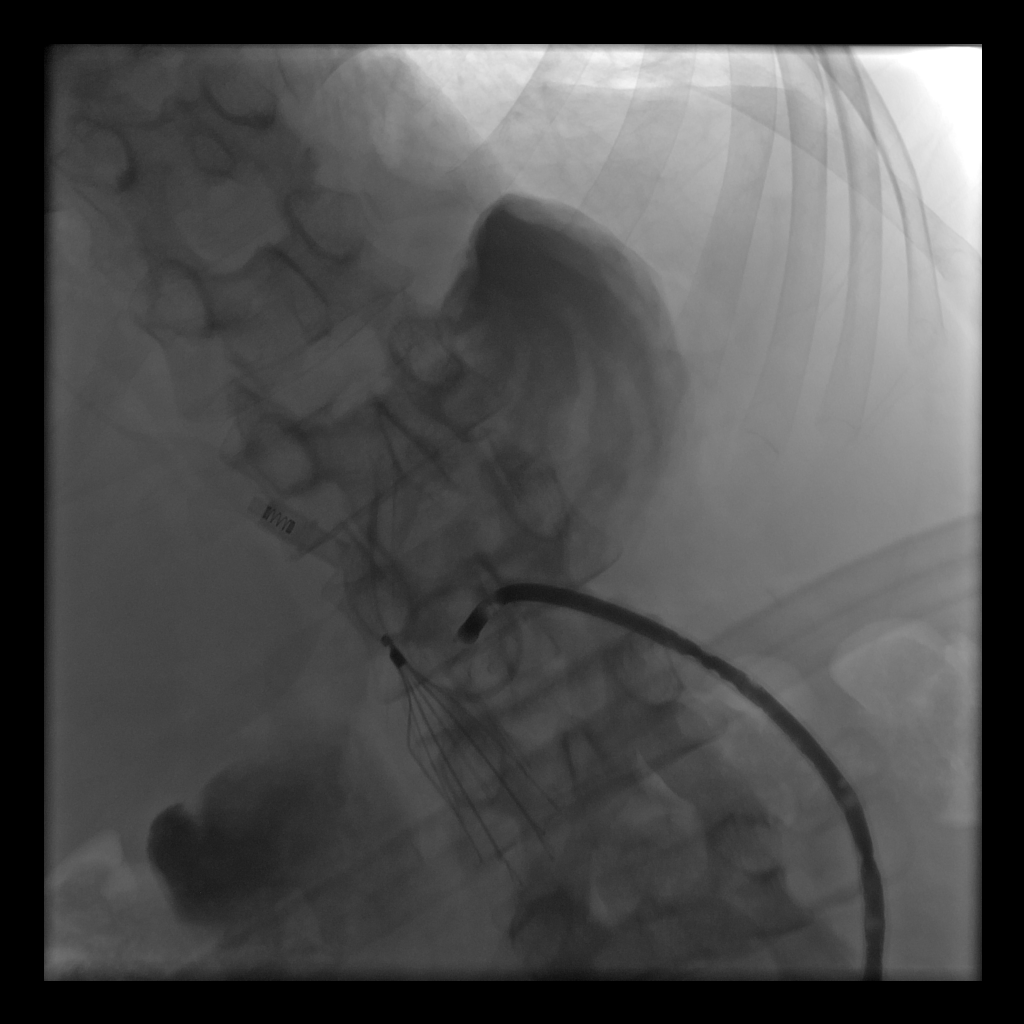

[Series 300: ir gastric tube perc chg w/o img guide · non-contrast · 1 of 1 slices shown]
[im 1/1]
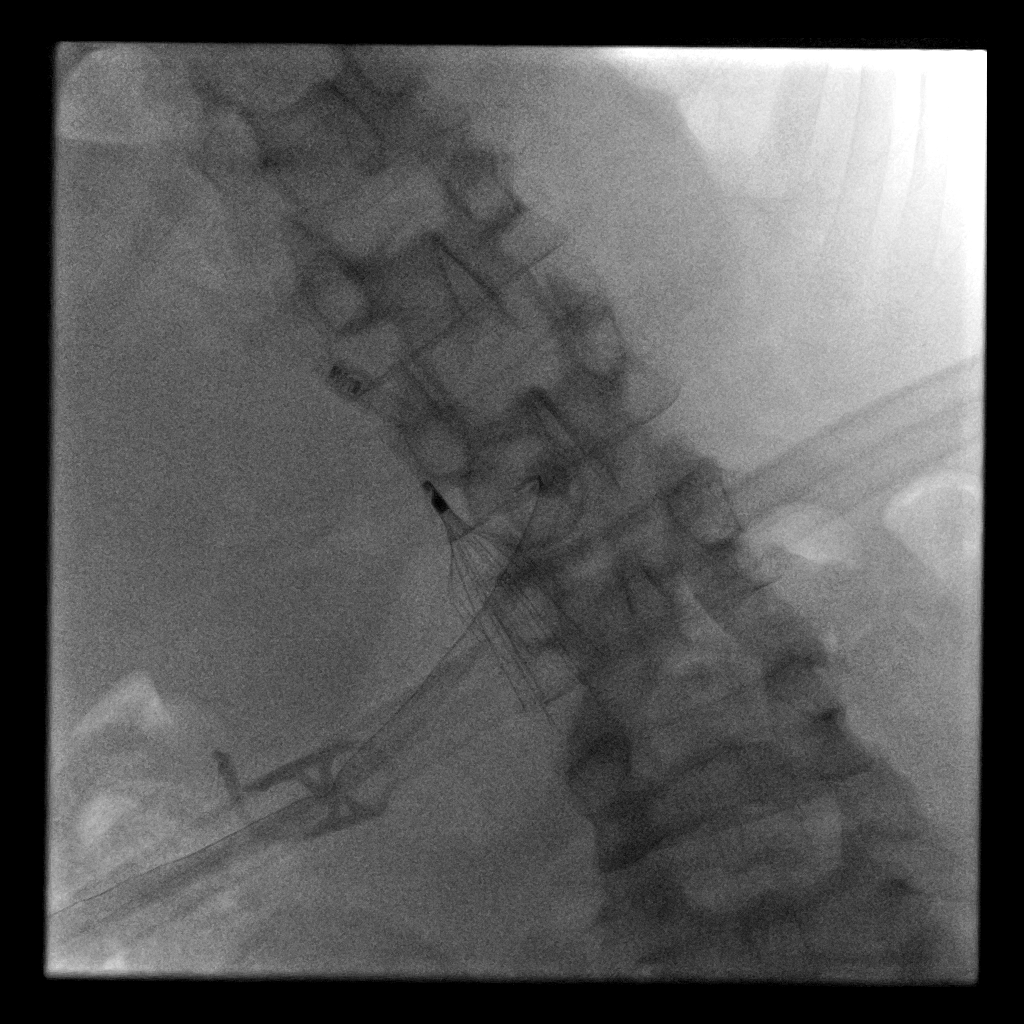

[3 of 3 positions shown; findings below may reference images not displayed]

Survey fluoroscopic inspection reveals stable position of the
gastrostomy catheter..

Injection demonstrates patency of the gastrostomy catheter, flow
into the gastric lumen. Some incompletely occlusive filling defects
are noted in the tubing.

The tube was cleared of debris with specialized brush. The external
bumper was repositioned appropriately. Catheter was flushed and
capped. The patient tolerated the procedure well..
IMPRESSION: 1. Gastrostomy catheter is in good position, okay for routine use.

## 2018-10-02 IMAGING — CT CT HEAD W/O CM
2 of 4 series · 8 of 47 positions shown, 10 images · non-contrast
Comparison: Prior CT from 07/11/2015.

CLINICAL DATA: Initial evaluation for acute altered mental status.

EXAM:
CT HEAD WITHOUT CONTRAST
TECHNIQUE: Contiguous axial images were obtained from the base of the skull
through the vertex without intravenous contrast.

[Series 205: coronals · coronal · 0.40mm/px · 5 of 63 slices shown, 7 images]
[im 8/63  brain]
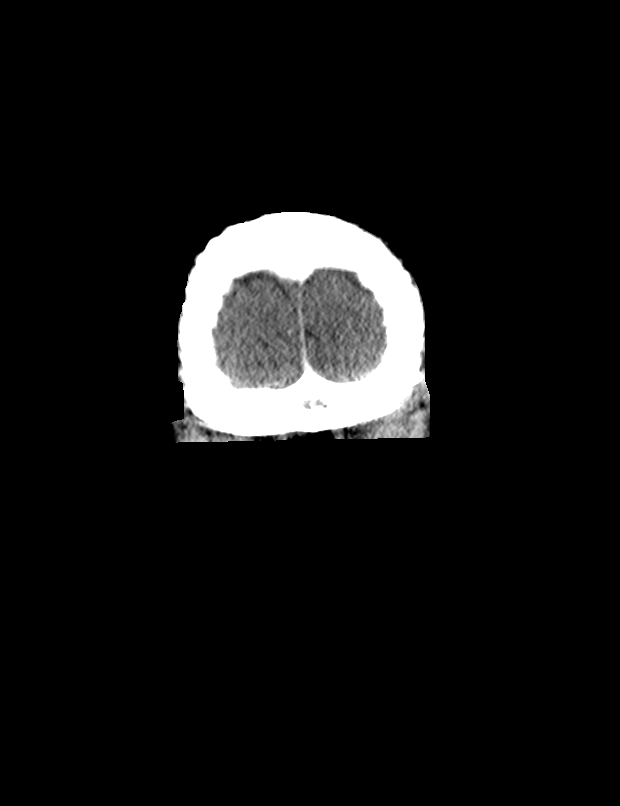
[im 8/63  bone]
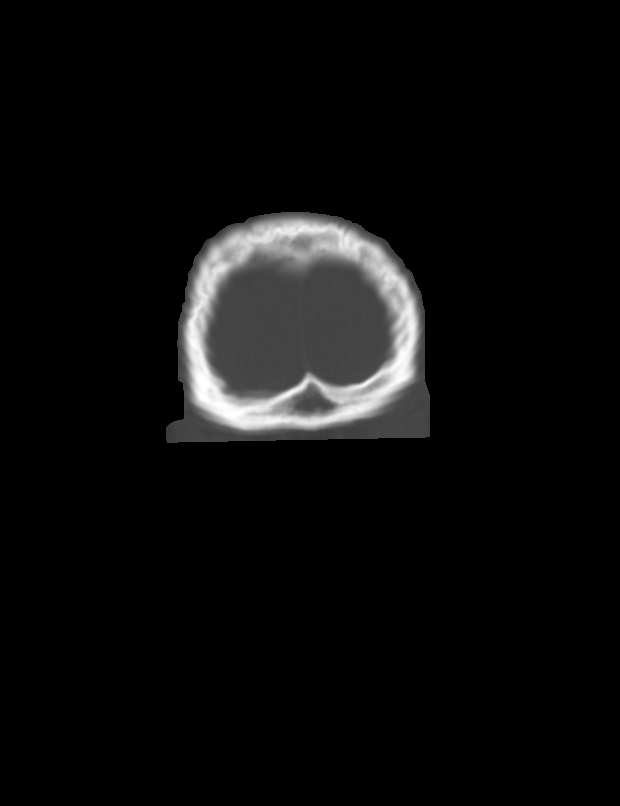
[im 22/63  brain]
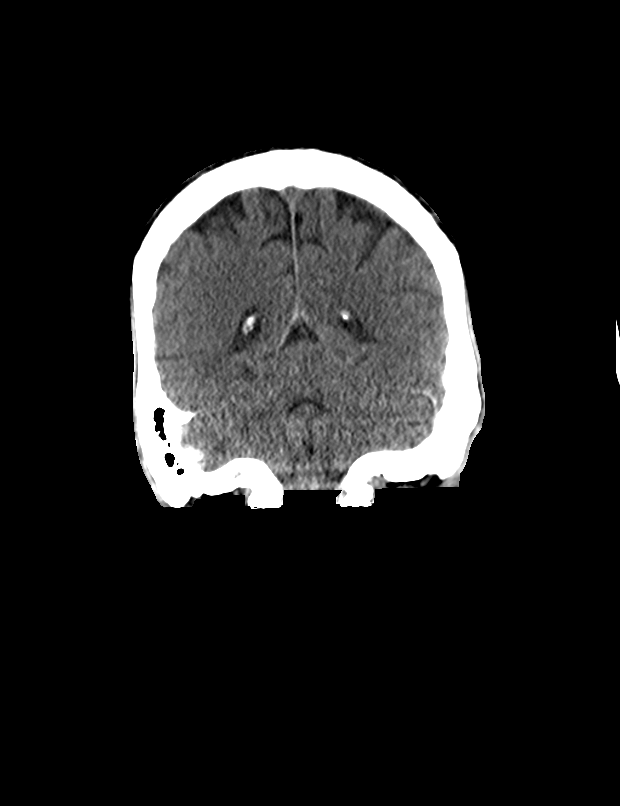
[im 35/63  brain]
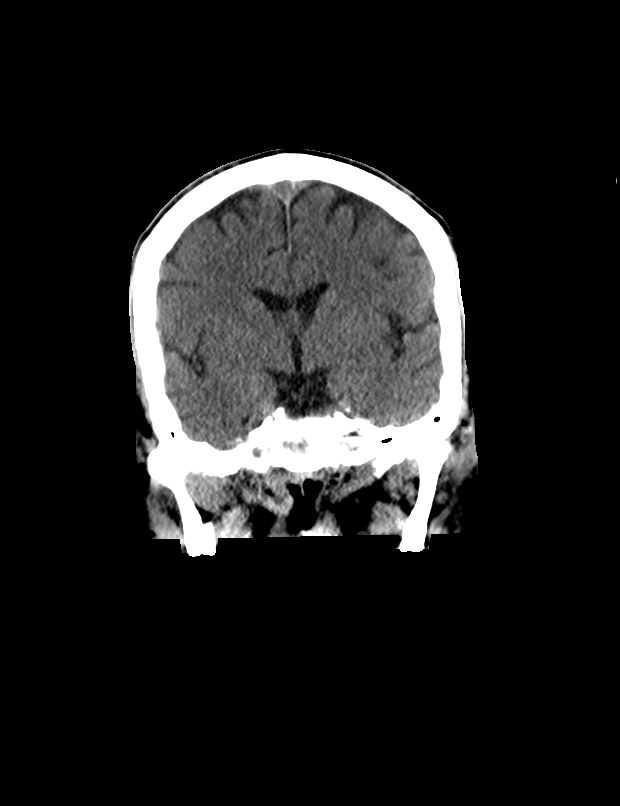
[im 48/63  brain]
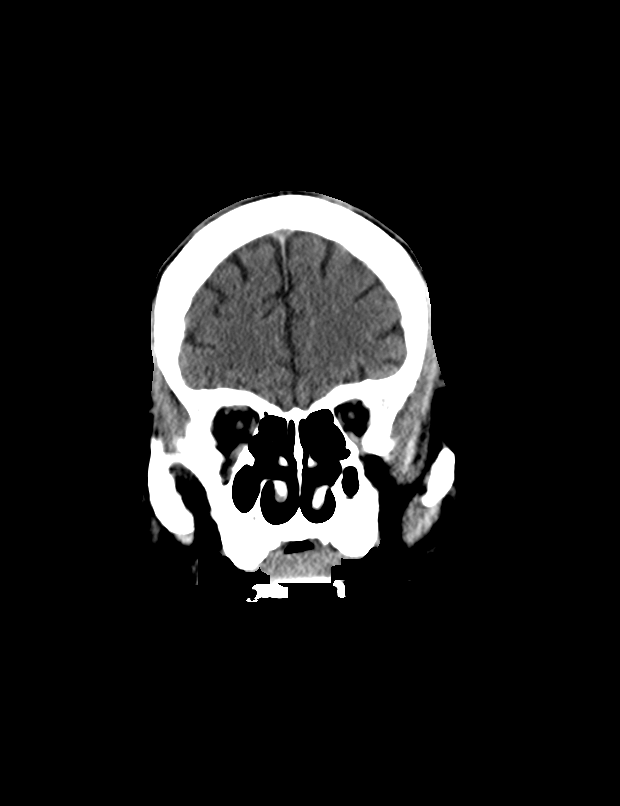
[im 62/63  brain]
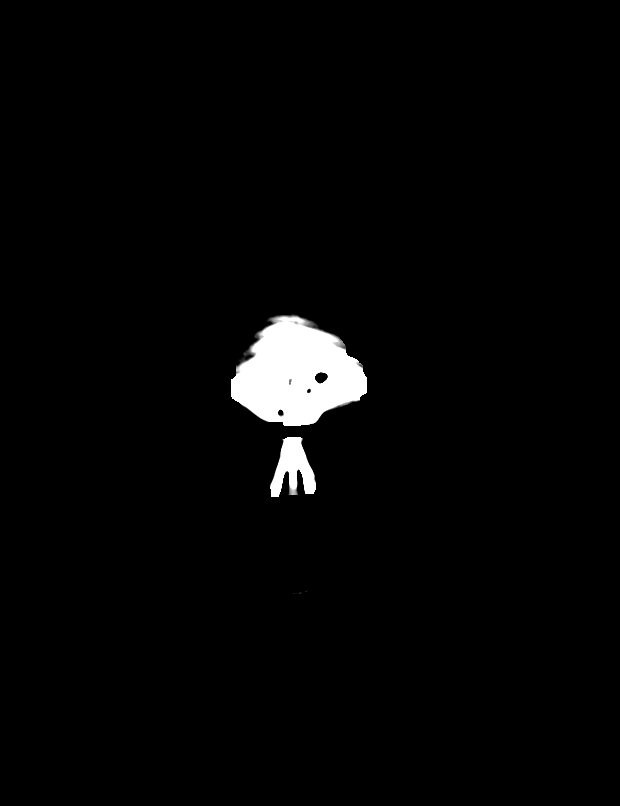
[im 62/63  bone]
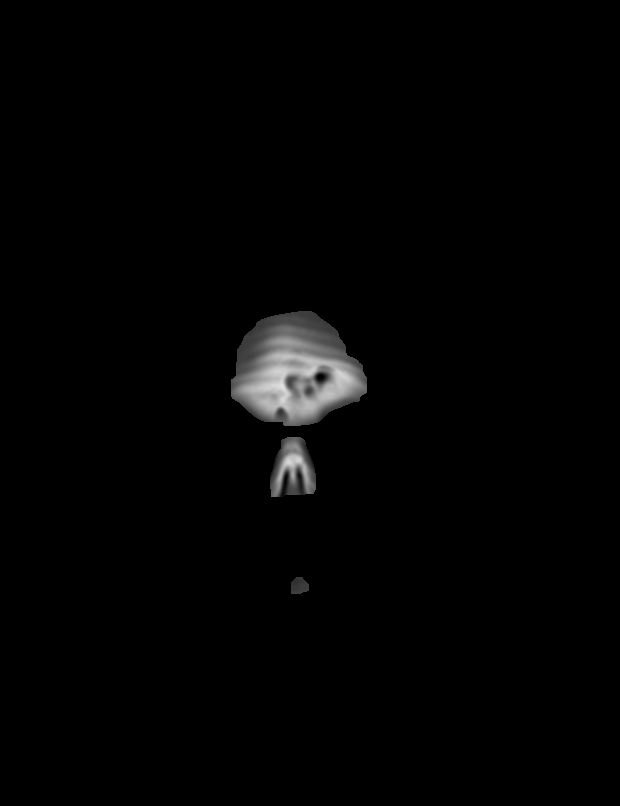

[Series 207: sagittals · sagittal · 0.40mm/px · 3 of 46 slices shown]
[im 16/46  brain]
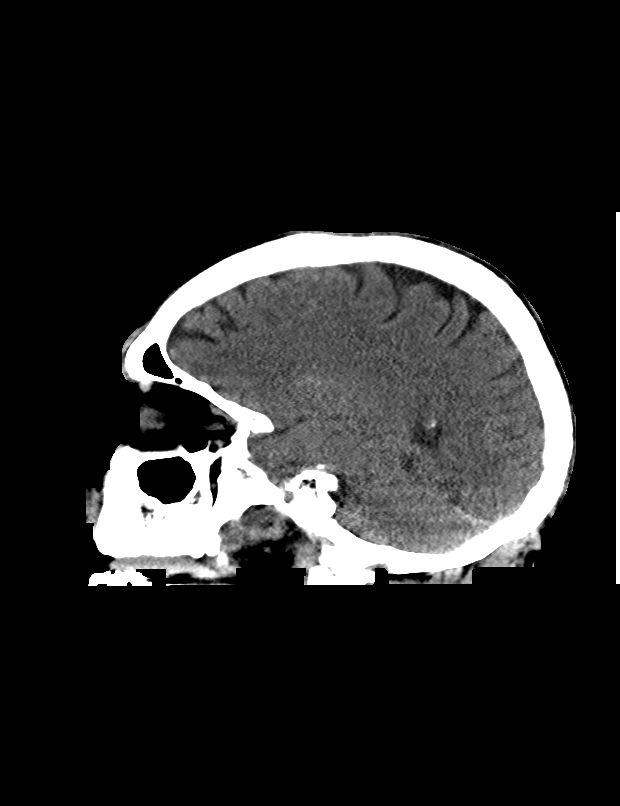
[im 23/46  brain]
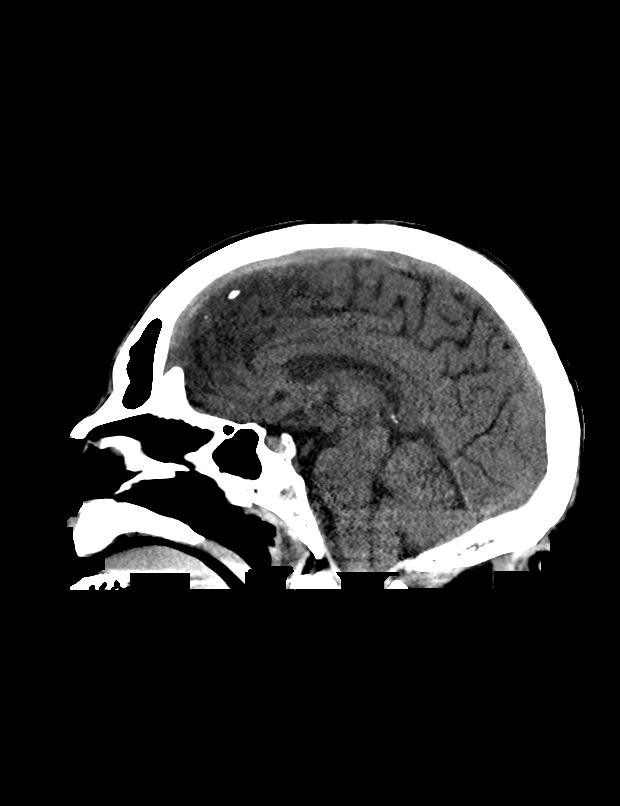
[im 31/46  brain]
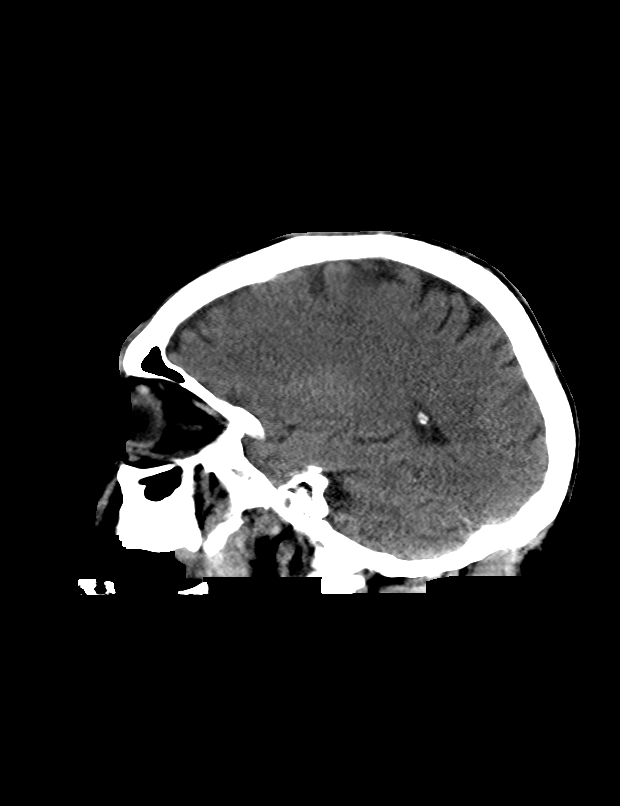

[8 of 47 positions shown; findings below may reference images not displayed]

FINDINGS: Brain: Cerebral volume within normal limits. No acute intracranial
hemorrhage. No evidence for acute large vessel territory infarct. No
mass lesion, midline shift or mass effect. No hydrocephalus. No
extra-axial fluid collection.

Vascular: No hyperdense vessel.

Skull: Scalp soft tissues demonstrate no acute abnormality.
Calvarium intact.

Sinuses/Orbits: Globes and orbital soft tissues within normal
limits. Mild scattered mucosal thickening within the sphenoid
sinuses. Paranasal sinuses are otherwise clear. No mastoid effusion.
IMPRESSION: No acute intracranial process identified.

## 2018-10-22 IMAGING — CT CT HEAD W/O CM
4 series · 16 of 47 positions shown, 18 images · non-contrast
Comparison: 07/24/2016 head CT.  08/17/2014 brain MR.

CLINICAL DATA: 30-year-old male with altered mental status.
Subsequent encounter.

EXAM:
CT HEAD WITHOUT CONTRAST
TECHNIQUE: Contiguous axial images were obtained from the base of the skull
through the vertex without intravenous contrast.

[Series 2: head without · axial · non-contrast · 0.41mm/px · z∈[-92,+13]mm · 6 of 31 slices shown, 8 images]
[im 5/31  brain]
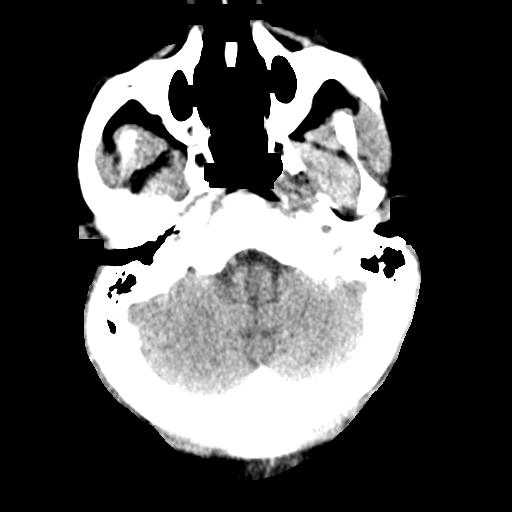
[im 5/31  bone]
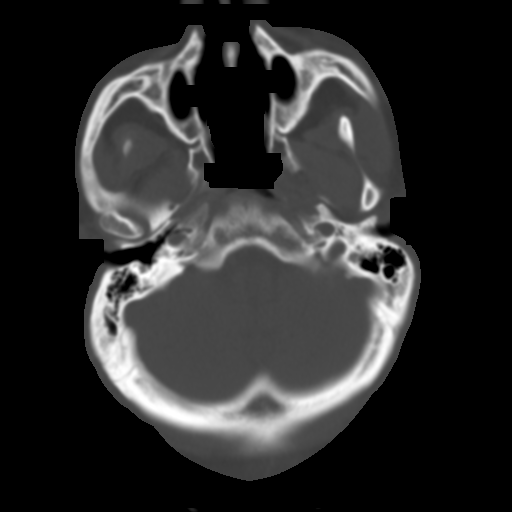
[im 9/31  brain]
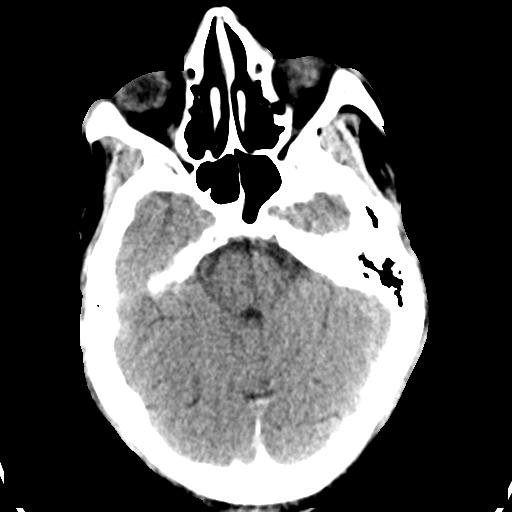
[im 13/31  brain]
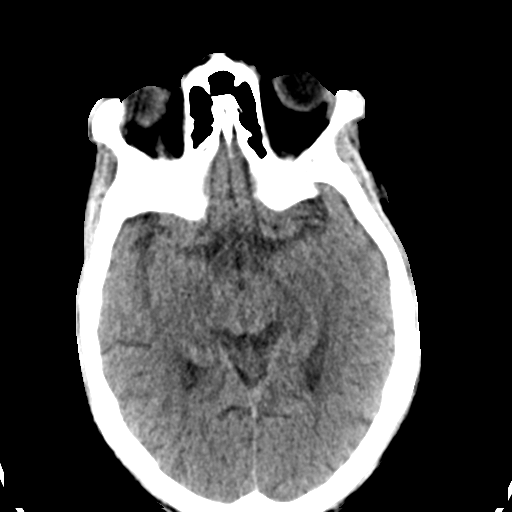
[im 18/31  brain]
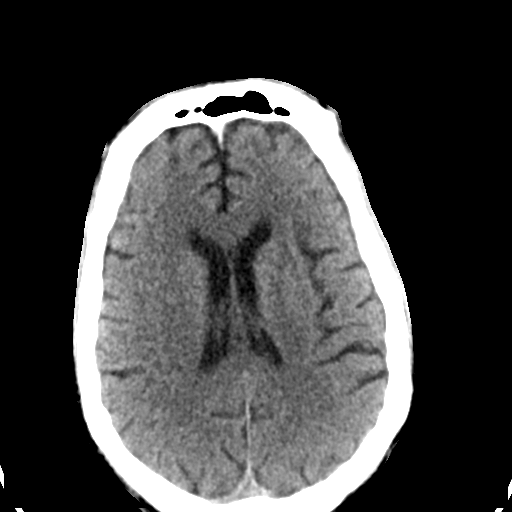
[im 22/31  brain]
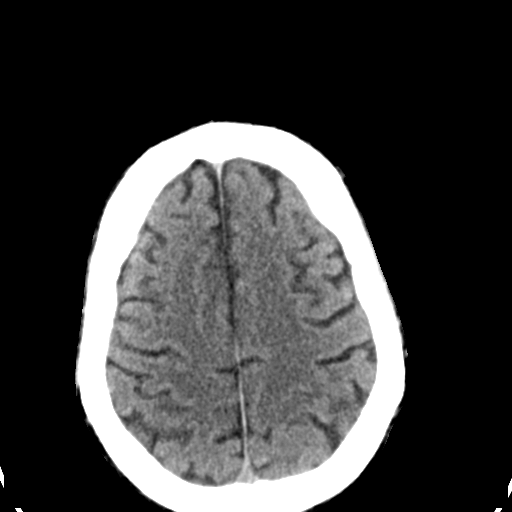
[im 22/31  bone]
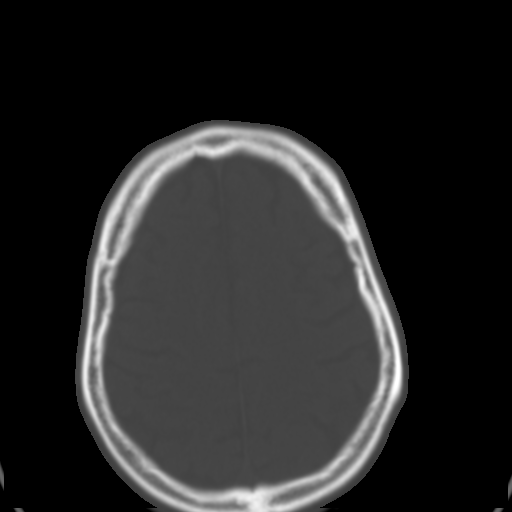
[im 26/31  brain]
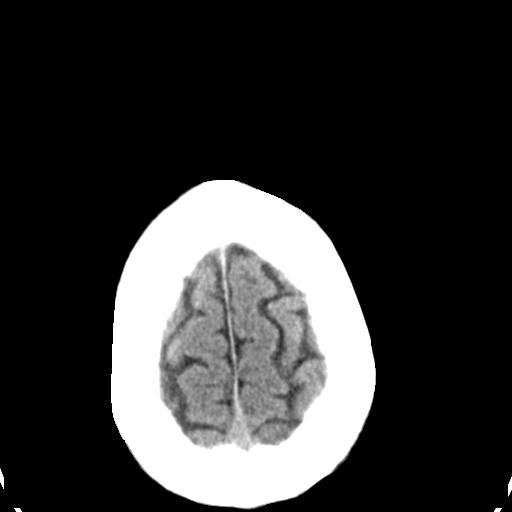

[Series 3: head bone · axial · 0.41mm/px · z∈[-98,-46]mm · 4 of 79 slices shown]
[im 8/79  bone]
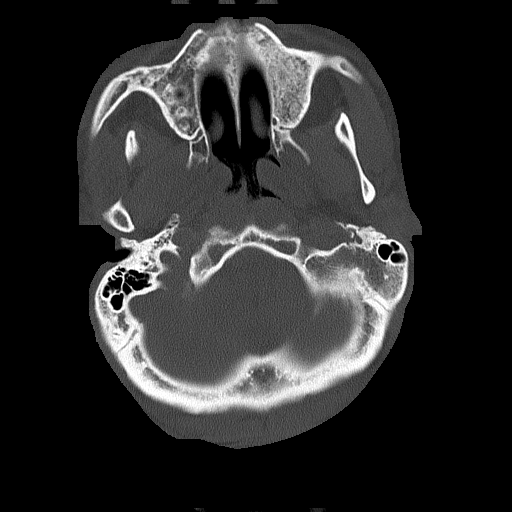
[im 15/79  bone]
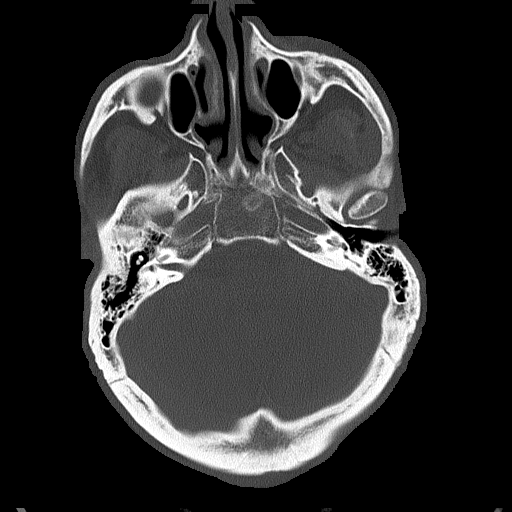
[im 27/79  bone]
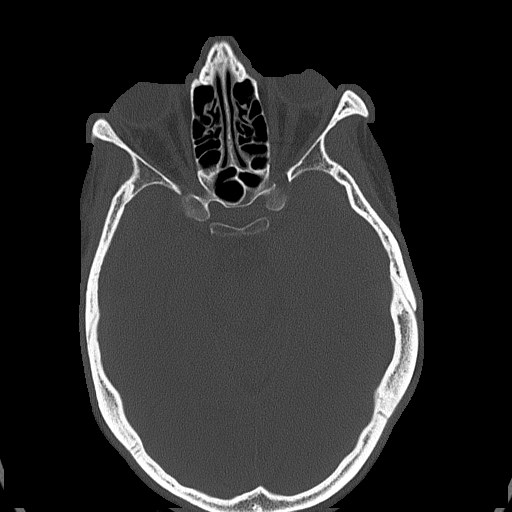
[im 34/79  bone]
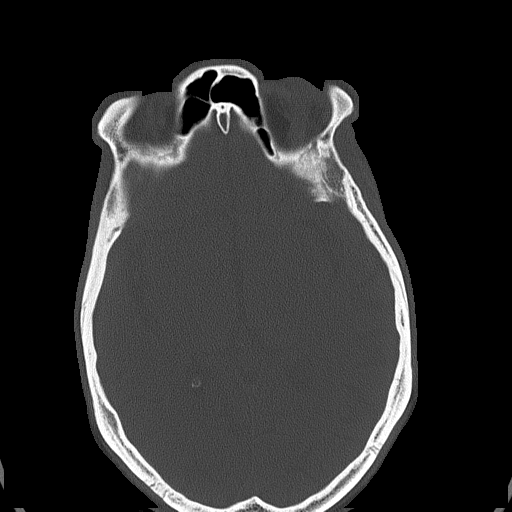

[Series 4: head without cor · coronal · non-contrast · 0.30mm/px · 3 of 67 slices shown]
[im 23/67  brain]
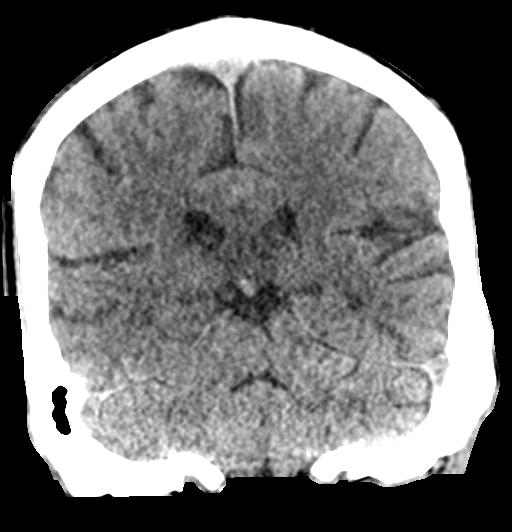
[im 30/67  brain]
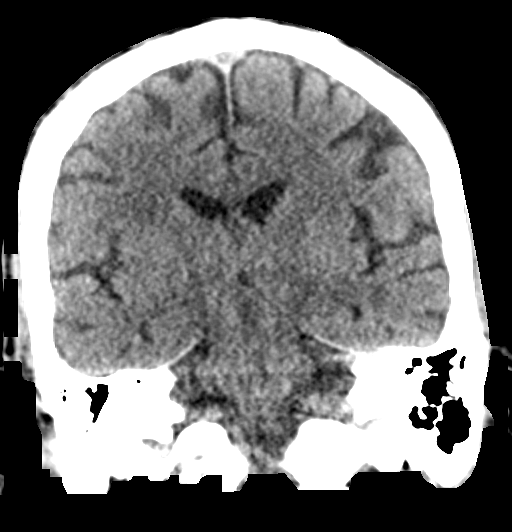
[im 37/67  brain]
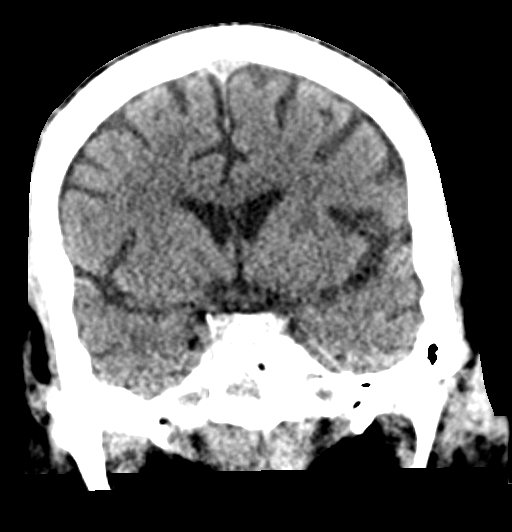

[Series 5: head without sag · sagittal · non-contrast · 0.31mm/px · 3 of 62 slices shown]
[im 21/62  brain]
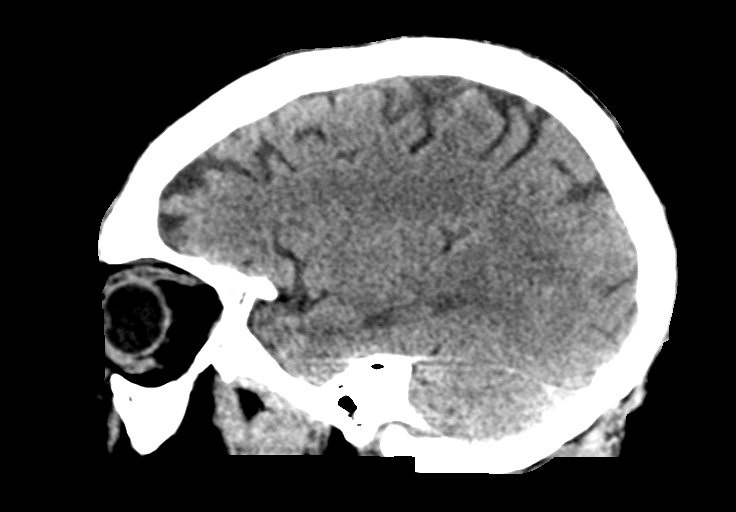
[im 31/62  brain]
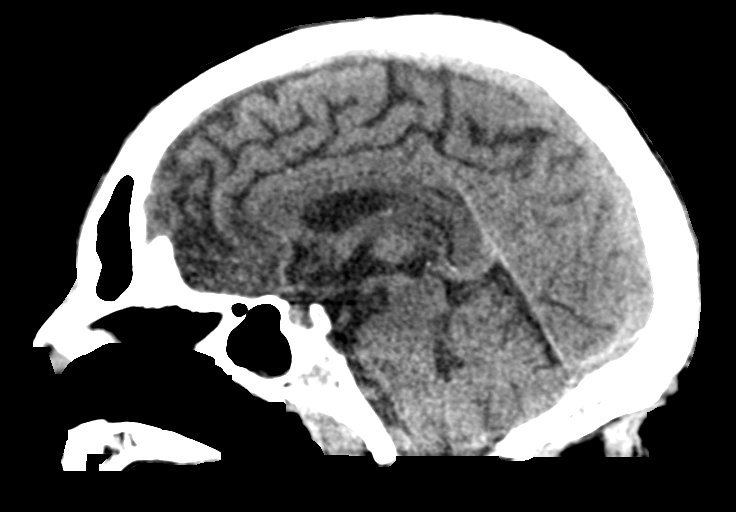
[im 41/62  brain]
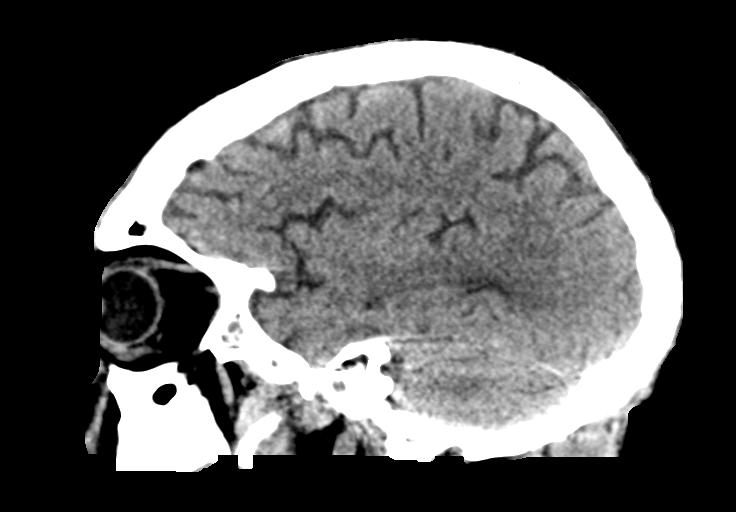

[16 of 47 positions shown; findings below may reference images not displayed]

FINDINGS: Brain: No intracranial hemorrhage or CT evidence of large acute
infarct.

Mild global atrophy without hydrocephalus.

No CT evidence of encephalitis or meningitis however, if this were
of clinical concern, further investigation may be necessary.

No intracranial mass lesion noted on this unenhanced exam.

Vascular: No hyperdense vessel.

Skull: No destructive lesion.

Sinuses/Orbits: No acute orbital abnormality. Mild mucosal
thickening ethmoid sinus air cells, sphenoid sinuses and maxillary
sinuses.

Other: Caries.
IMPRESSION: No acute intracranial abnormality.

Mild mucosal thickening ethmoid sinus air cells, sphenoid sinuses
and maxillary sinuses.

Please see above.

## 2018-10-22 IMAGING — CT CT CHEST W/O CM
2 of 4 series · 15 of 36 positions shown, 18 images · non-contrast
Comparison: Chest x-ray 08/13/2016

CLINICAL DATA: Possible pneumonia

EXAM:
CT CHEST WITHOUT CONTRAST
TECHNIQUE: Multidetector CT imaging of the chest was performed following the
standard protocol without IV contrast.

[Series 2: chest w/o 2mm st · axial · non-contrast · 0.56mm/px · z∈[-521,-261]mm · 12 of 150 slices shown, 15 images]
[im 10/150  mediastinal]
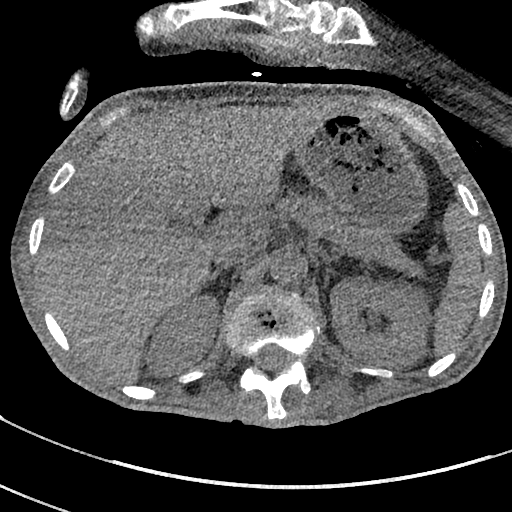
[im 10/150  lung]
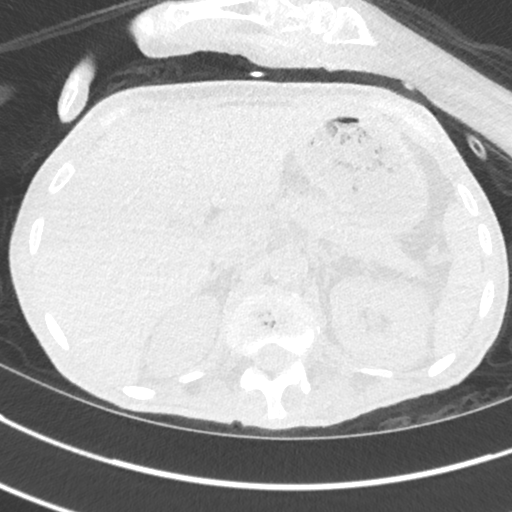
[im 20/150  lung]
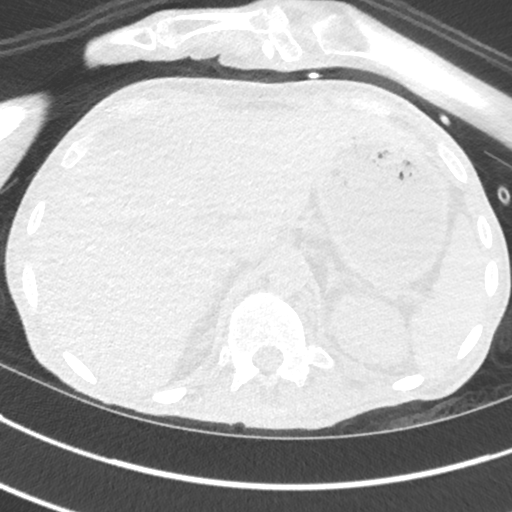
[im 30/150  lung]
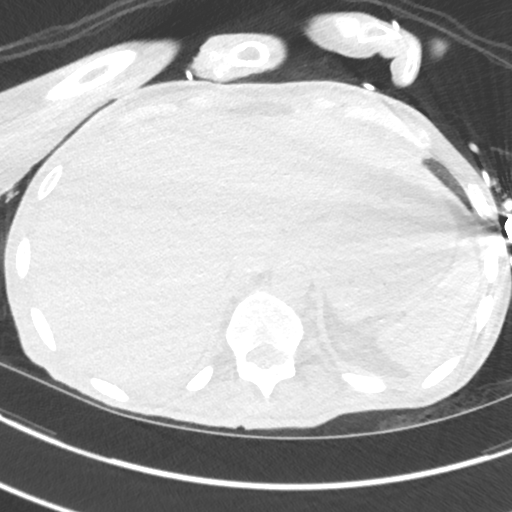
[im 50/150  lung]
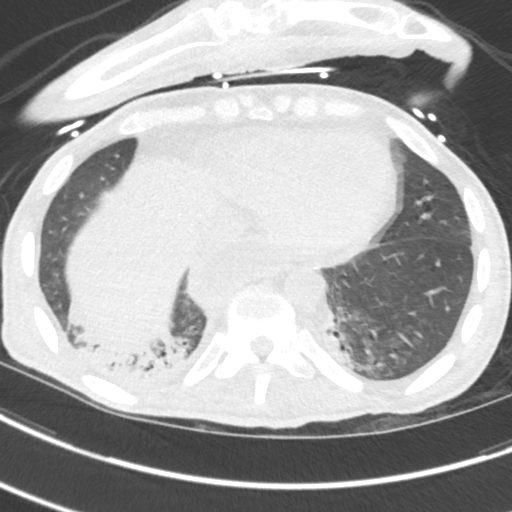
[im 60/150  mediastinal]
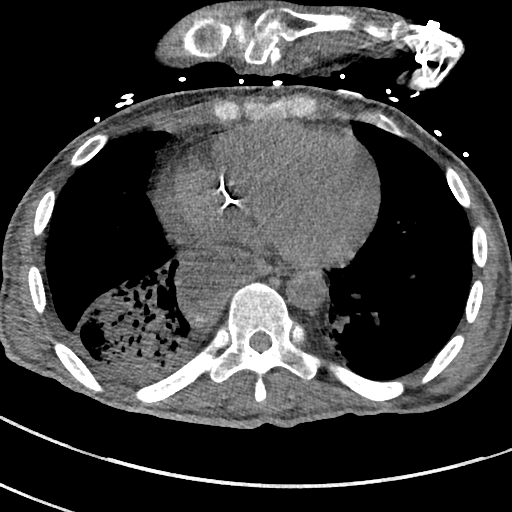
[im 60/150  lung]
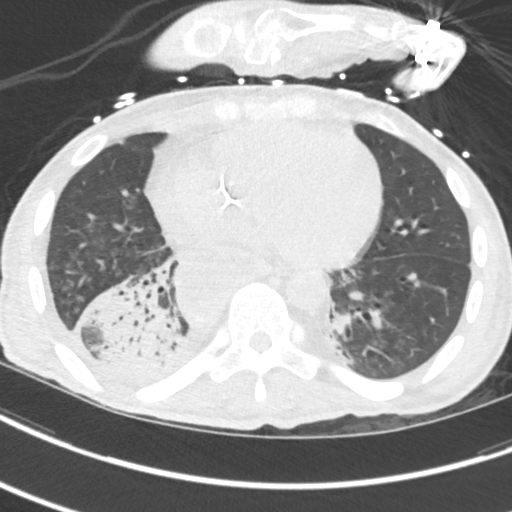
[im 70/150  lung]
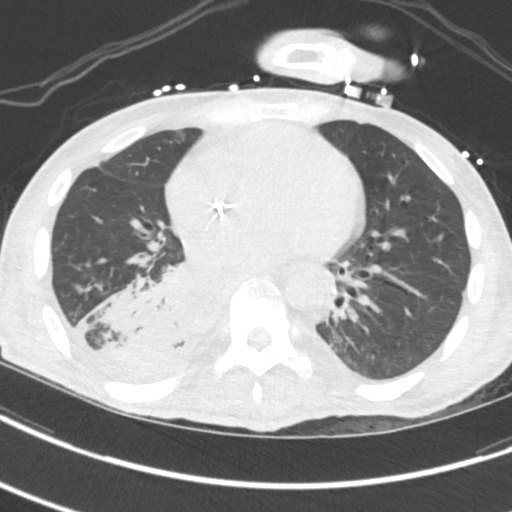
[im 80/150  lung]
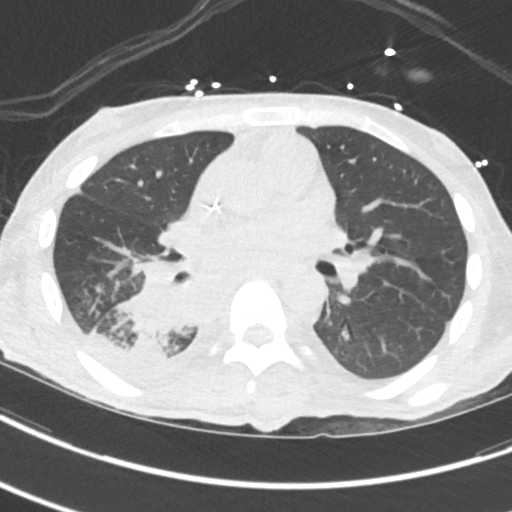
[im 90/150  lung]
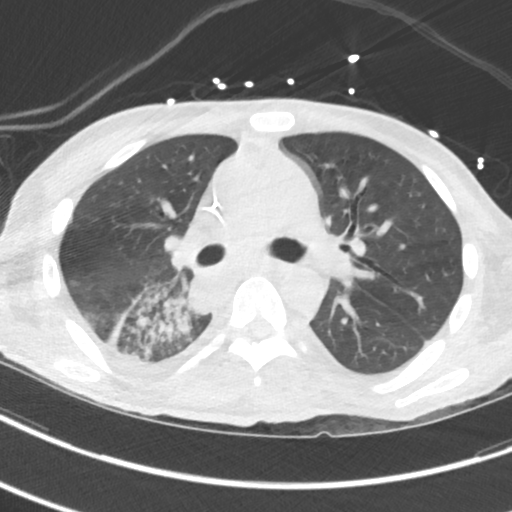
[im 100/150  mediastinal]
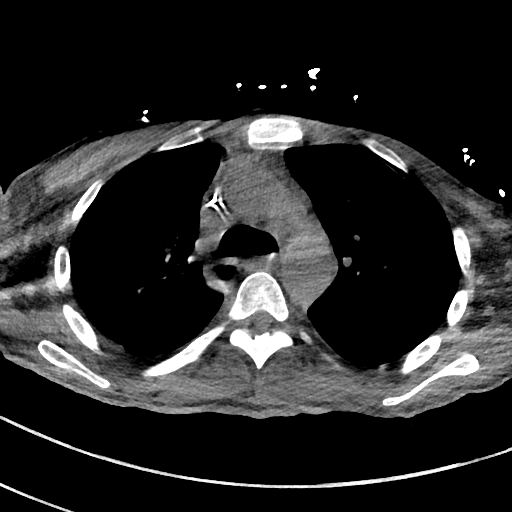
[im 100/150  lung]
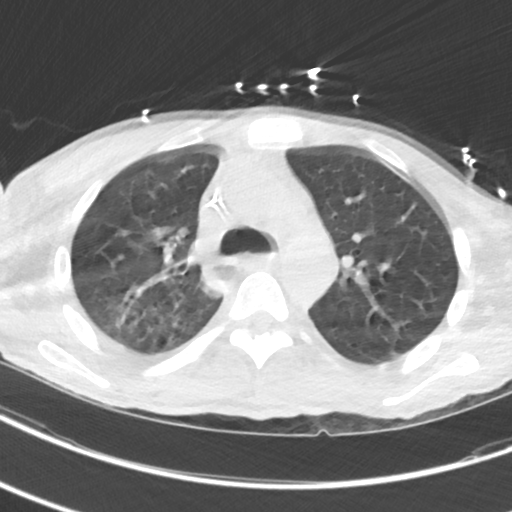
[im 120/150  lung]
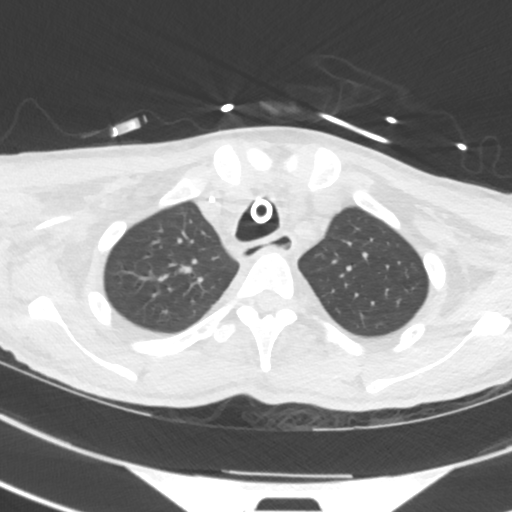
[im 130/150  lung]
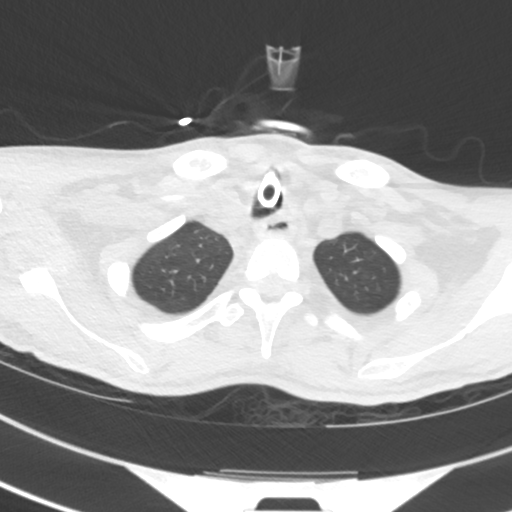
[im 140/150  lung]
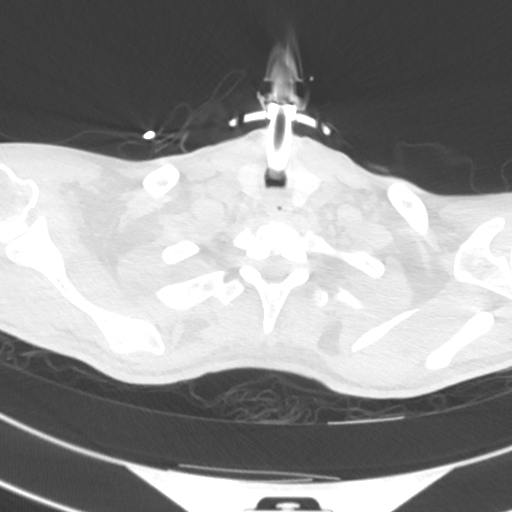

[Series 4: chest w/o 3mm st cor · coronal · non-contrast · 0.59mm/px · 3 of 101 slices shown]
[im 21/101  lung]
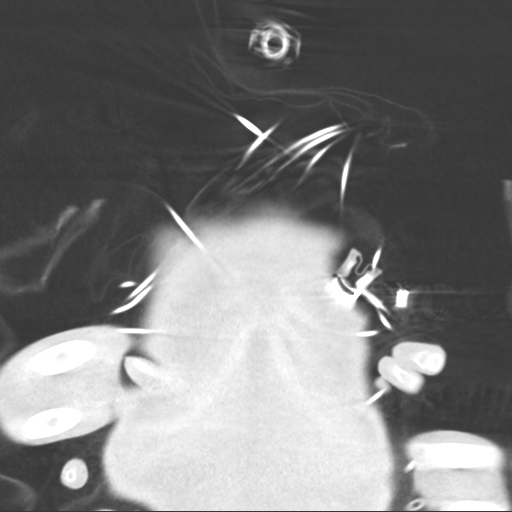
[im 41/101  lung]
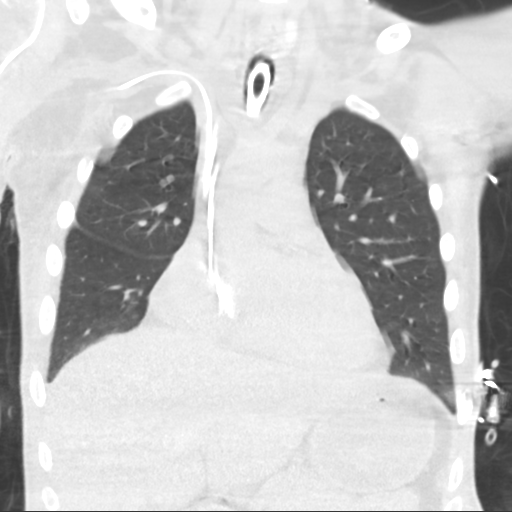
[im 61/101  lung]
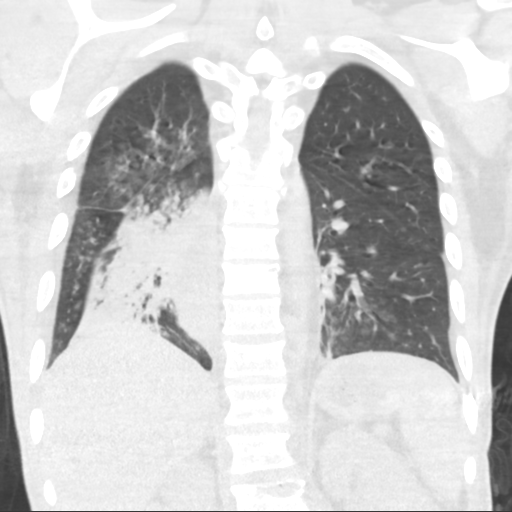

[15 of 36 positions shown; findings below may reference images not displayed]

FINDINGS: Cardiovascular: Borderline cardiomegaly is noted. No pericardial
effusion. There is right arm PICC line with tip in right atrium.

Mediastinum/Nodes: No mediastinal adenopathy is noted on this
unenhanced scan. Moderate size hiatal hernia measures 5.1 cm.
Tracheostomy tube partially visualized.

Lungs/Pleura: Images of the lung parenchyma shows consolidation with
air bronchogram in right lower lobe consistent with pneumonia. Small
patchy infiltrate with air bronchogram noted in left lower lobe
posterior medially. Small patchy infiltrate in posterior aspect
right upper lobe. Central airways are patent.

Upper Abdomen: The visualized upper abdomen shows no adrenal gland
mass. Nonspecific mild left perinephric stranding. Clinical
correlation is necessary.

Musculoskeletal: No destructive bony lesions are noted. Sagittal
images of the spine shows mild compression deformities T7-T8 T10 and
T12 vertebral bodies. There is probable healing fracture and
Schmorl's node deformity upper endplate of T8 and upper endplate of
L1 vertebral body. Clinical correlation is necessary. Further
correlation with MRI could be performed as clinically warranted.
IMPRESSION: 1. Moderate size hiatal hernia.
2. There is consolidation with air bronchogram in right lower lobe
consistent with pneumonia. Small patchy infiltrate/pneumonia in
noted in left lower lobe posterior medially. Small patchy infiltrate
in posterior aspect of the right upper lobe. No mediastinal hematoma
or adenopathy.
3. Tracheostomy tube in place.
4. Sagittal images of the spine shows mild compression deformities
T7-T8 T10 and T12 vertebral bodies. There is probable healing
fracture and Schmorl's node deformity upper endplate of T8 and upper
endplate of L1 vertebral body. Less likely osteomyelitis. Clinical
correlation is necessary. Further correlation with MRI could be
performed as clinically warranted.

## 2018-10-22 IMAGING — DX DG CHEST 1V PORT
1 series · 1 of 1 positions shown · non-contrast
Comparison: 07/24/2016

CLINICAL DATA: Dyspnea.  Lung congestion.

EXAM:
PORTABLE CHEST 1 VIEW

[chest ap]
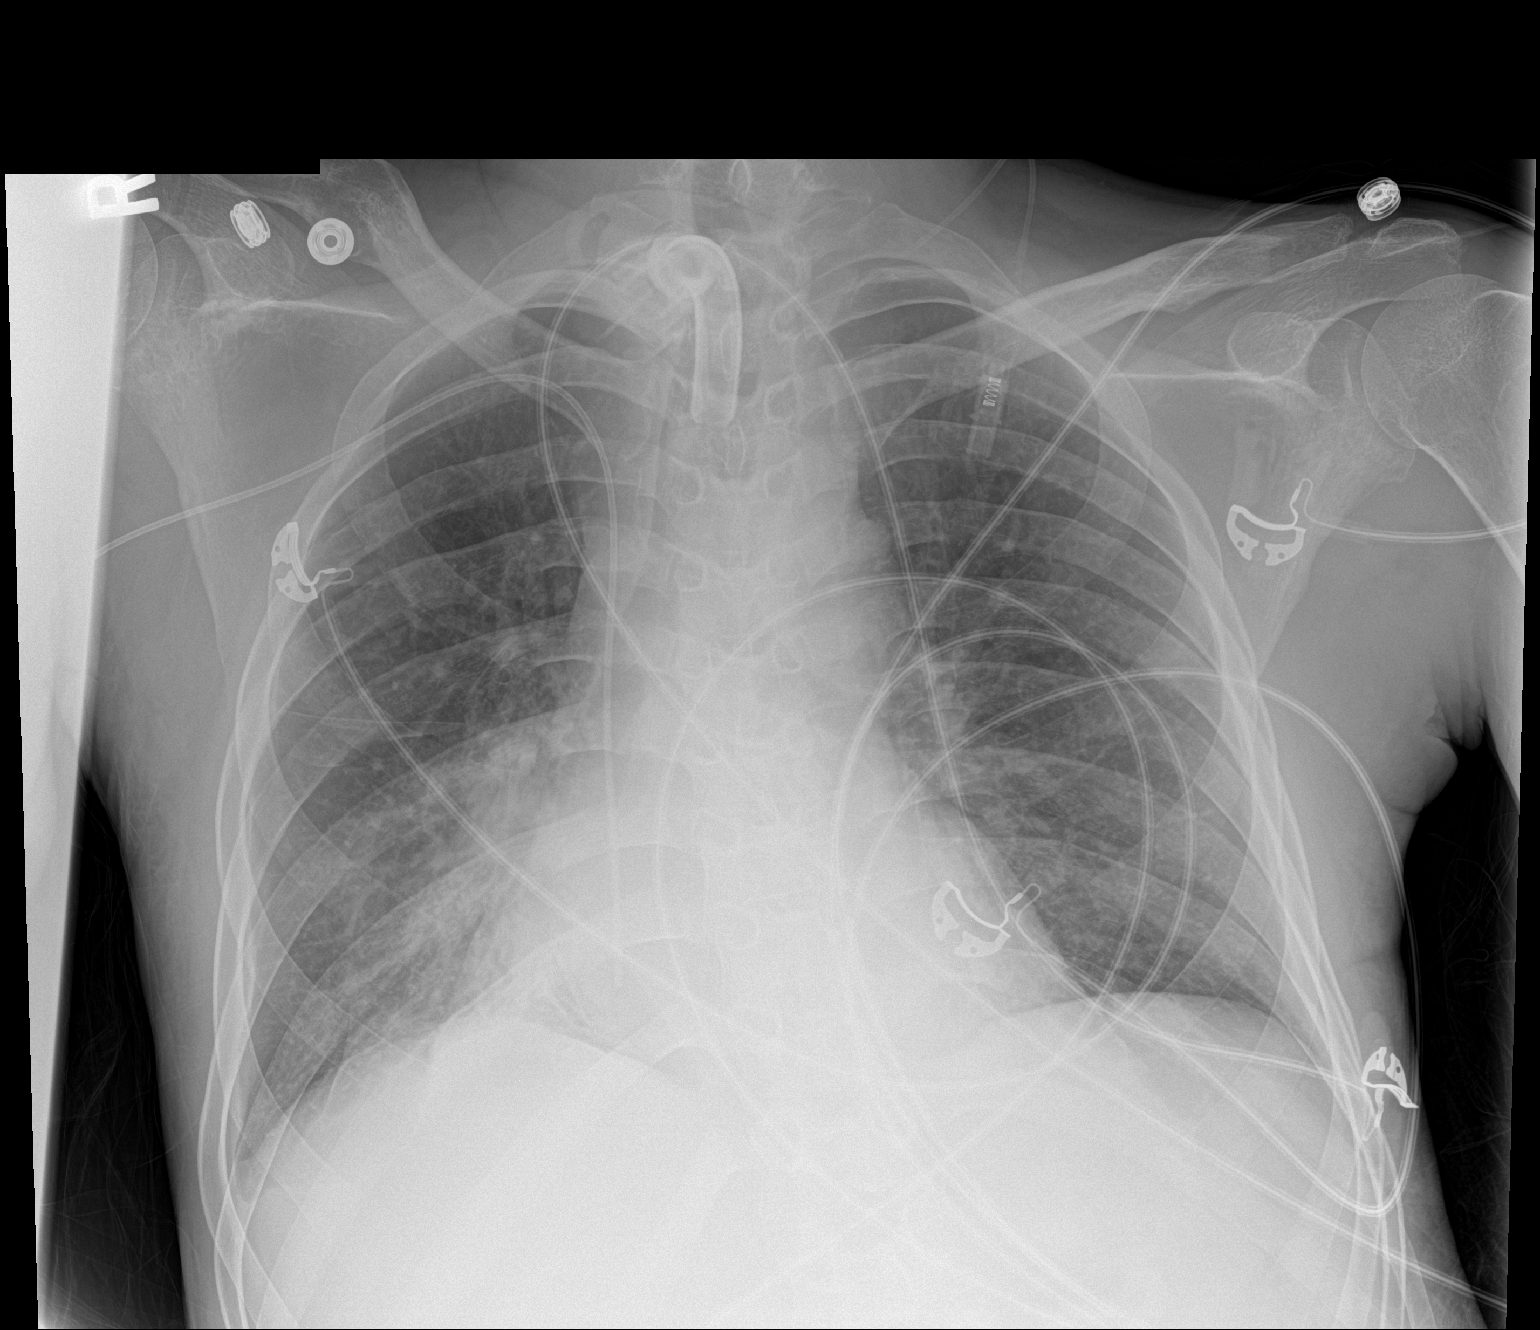

[1 of 1 positions shown; findings below may reference images not displayed]

FINDINGS: Tracheostomy tube tip is above the carina. There is a right arm PICC
line with tip in the inferior right atrium. Normal heart size. Small
right pleural effusion is again noted. Persistent airspace
consolidation within the right lower lobe.
IMPRESSION: Persistent right lower lobe pneumonia.

Low position of right arm PICC line with tip in the inferior right
atrium.

## 2018-10-25 IMAGING — CR DG ABDOMEN 1V
1 series · 1 of 1 positions shown · non-contrast
Comparison: Chest CT - 08/13/2016.

CLINICAL DATA: Peg tube leaking.

EXAM:
ABDOMEN - 1 VIEW

[AP]
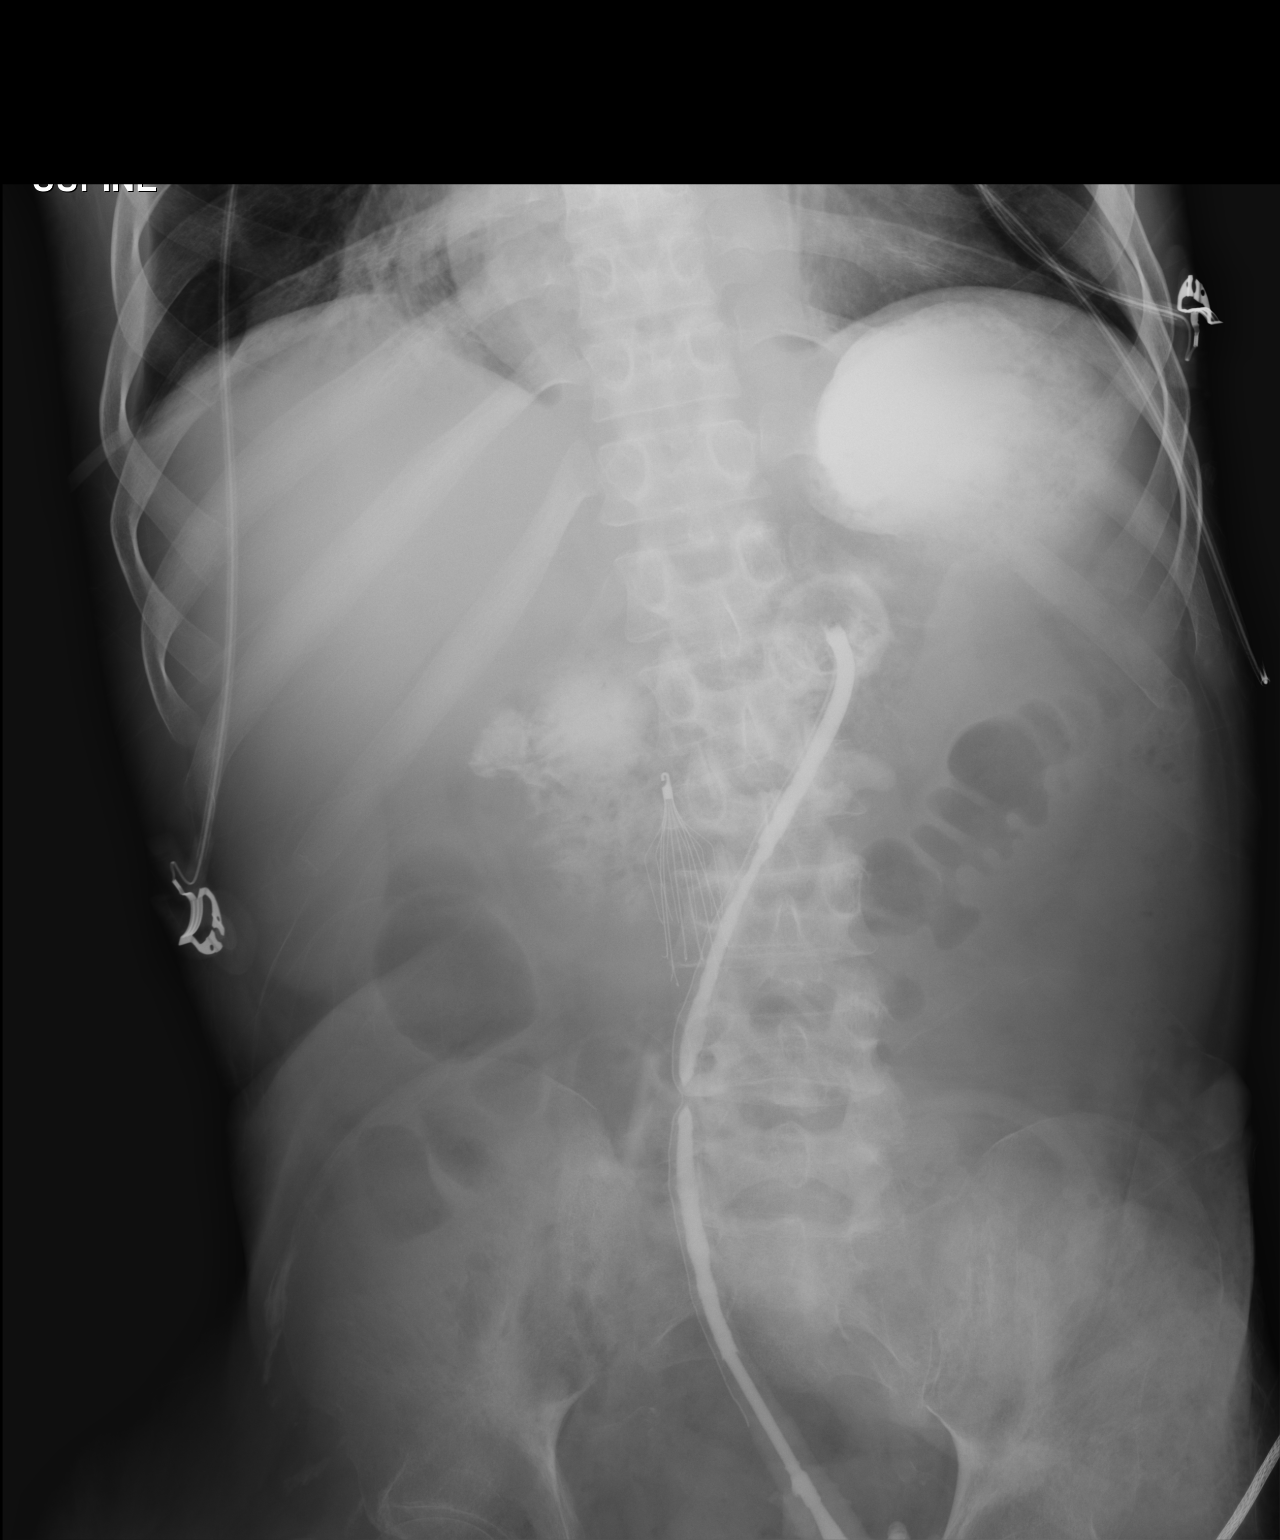

[1 of 1 positions shown; findings below may reference images not displayed]

FINDINGS: Contrast has been injected via the gastrostomy tube with
opacification of the stomach and proximal duodenum.

Paucity of bowel gas without evidence of enteric obstruction.

No supine evidence of pneumoperitoneum. No pneumatosis or portal
venous gas.

Limited visualization of the lower thorax demonstrates right basilar
heterogeneous opacities with suspected air bronchograms compatible
with the findings seen on preceding chest CT.

No definite acute or aggressive osseous abnormalities.
IMPRESSION: 1. Contrast injection of gastrostomy tube demonstrates opacification
of the stomach and proximal small bowel. No evidence of enteric
obstruction.
2. Findings worrisome for right lower lung pneumonia, similar to the
08/13/2016 examination.

## 2018-11-04 IMAGING — CT CT ABD-PELV W/ CM
2 of 4 series · 14 of 46 positions shown, 16 images · IV contrast (Omni 300)
Comparison: 12/10/2015

CLINICAL DATA: Shortness of Breath.  Blood in colostomy bag.

EXAM:
CT ABDOMEN AND PELVIS WITH CONTRAST
TECHNIQUE: Multidetector CT imaging of the abdomen and pelvis was performed
using the standard protocol following bolus administration of
intravenous contrast.
CONTRAST:  100 cc [id number] IV

[Series 2: a/p w/ 5mm · axial · 0.61mm/px · z∈[+1172,+1547]mm · 11 of 89 slices shown, 13 images]
[im 7/89  soft-tissue]
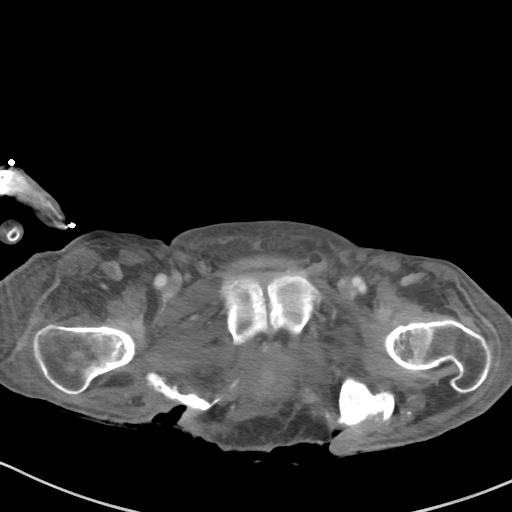
[im 7/89  bone]
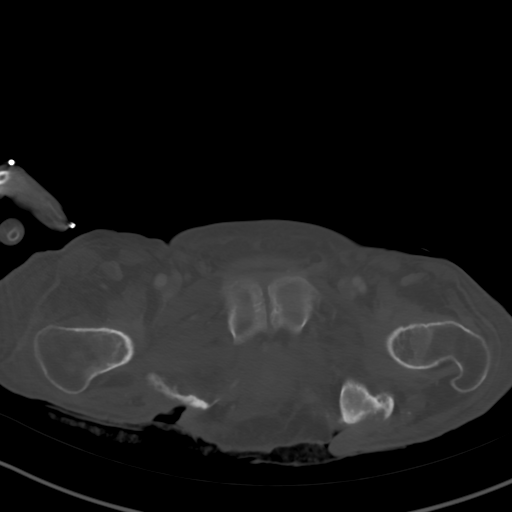
[im 14/89  soft-tissue]
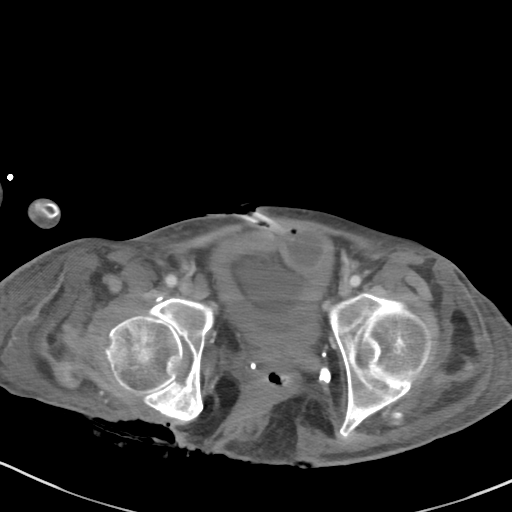
[im 20/89  soft-tissue]
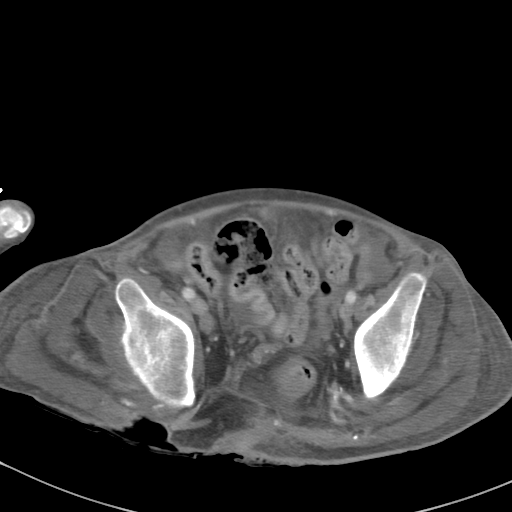
[im 30/89  soft-tissue]
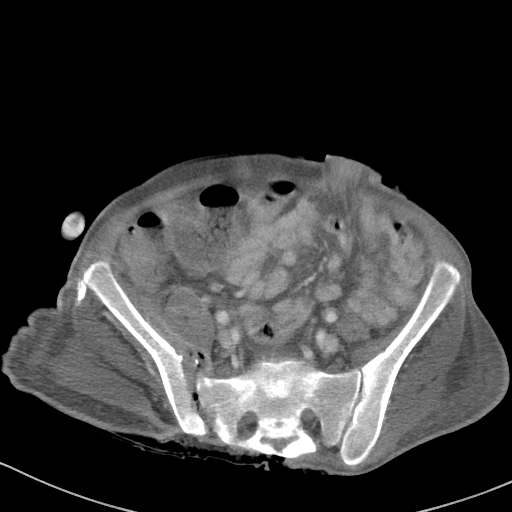
[im 36/89  soft-tissue]
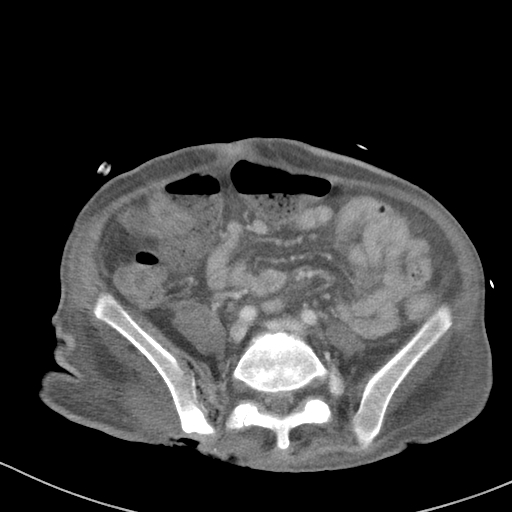
[im 46/89  soft-tissue]
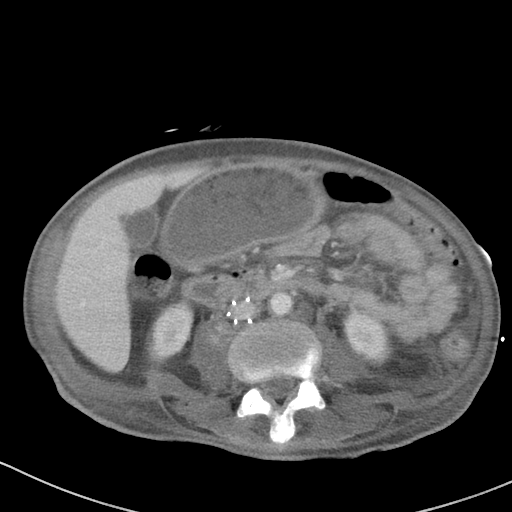
[im 53/89  soft-tissue]
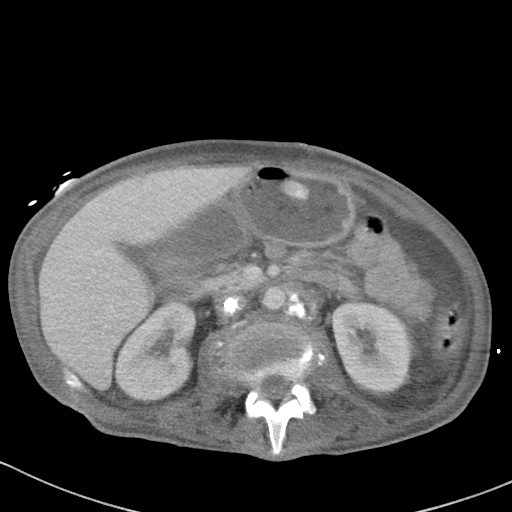
[im 59/89  soft-tissue]
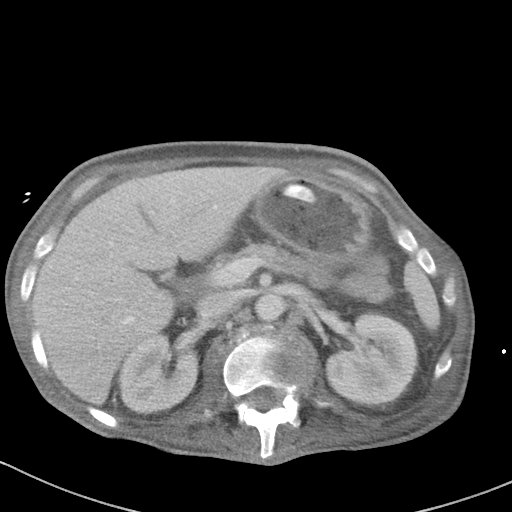
[im 69/89  soft-tissue]
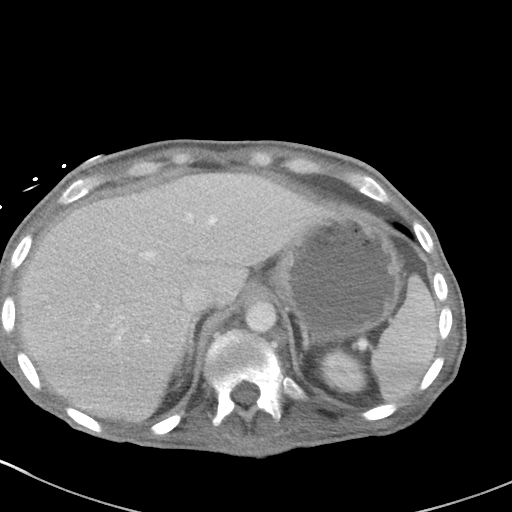
[im 69/89  bone]
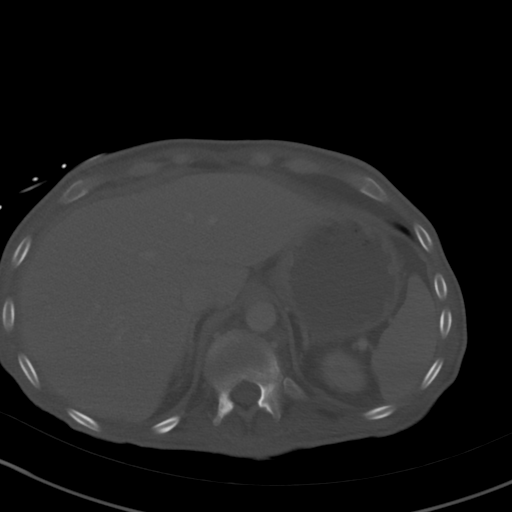
[im 75/89  soft-tissue]
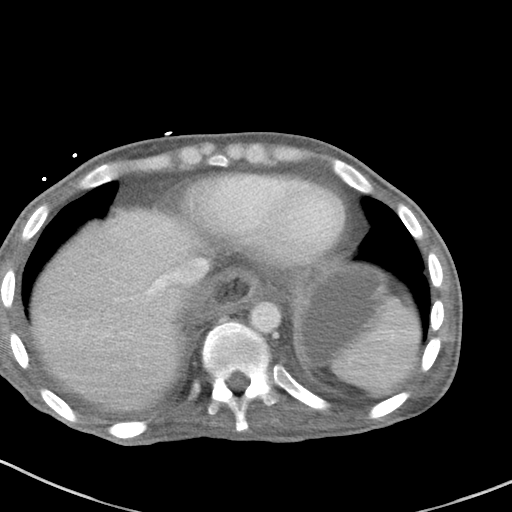
[im 82/89  soft-tissue]
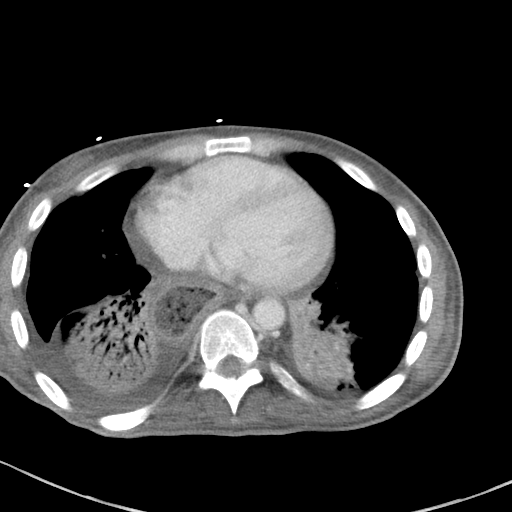

[Series 5: a/p w/ cor · coronal · 0.66mm/px · 3 of 138 slices shown]
[im 46/138  soft-tissue]
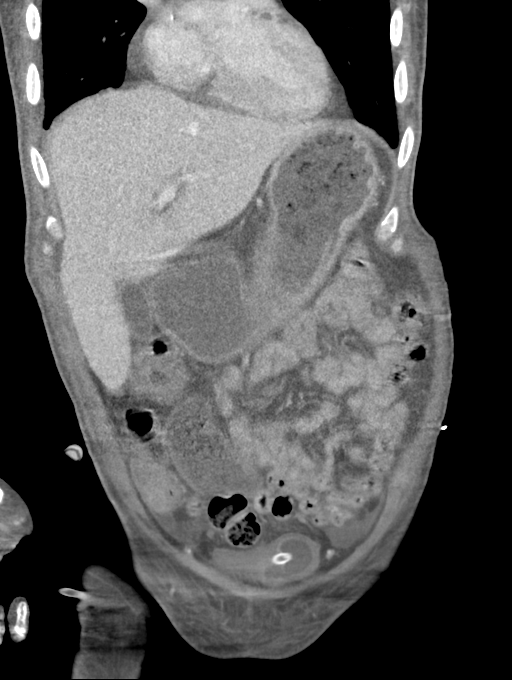
[im 61/138  soft-tissue]
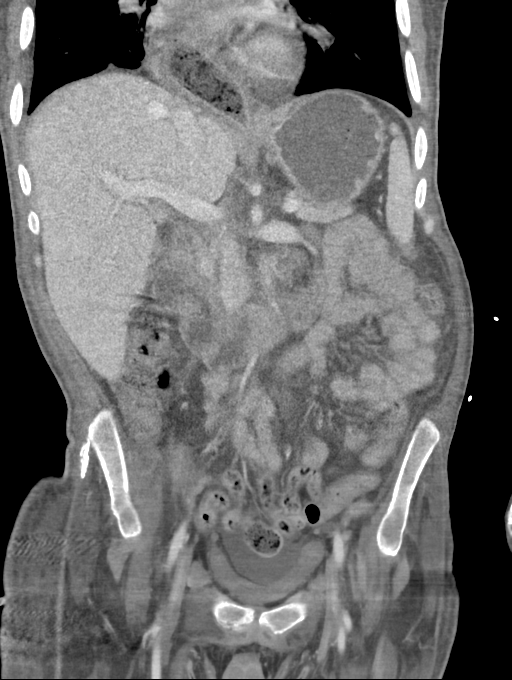
[im 77/138  soft-tissue]
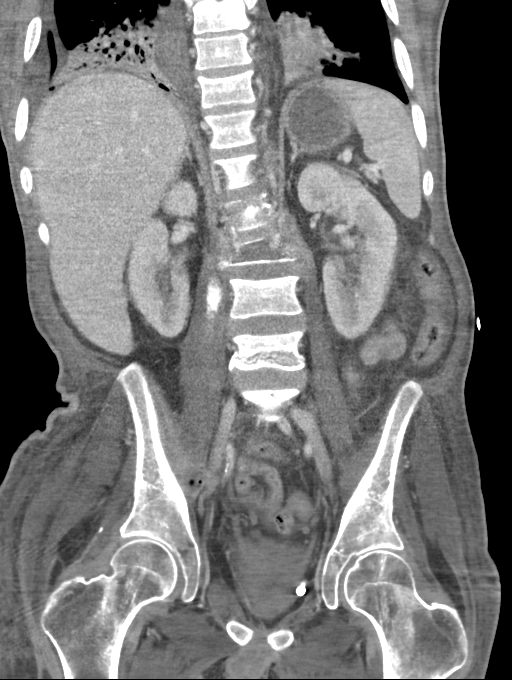

[14 of 46 positions shown; findings below may reference images not displayed]

FINDINGS: Lower chest: The distal esophagus is filled with debris. Small
bilateral pleural effusions. Dense consolidation in the lower lobes
bilaterally, similar to prior study. Heart is normal size.

Hepatobiliary: No focal hepatic abnormality. Gallbladder
unremarkable.

Pancreas: No focal abnormality or ductal dilatation.

Spleen: No focal abnormality.  Normal size.

Adrenals/Urinary Tract: No adrenal abnormality. No focal renal
abnormality. No stones or hydronephrosis. Urinary bladder is
decompressed with mild wall thickening. Suprapubic catheter in
place.

Stomach/Bowel: Left lower quadrant ostomy noted. No evidence of
bowel obstruction. Gastrostomy tube is in place within the stomach.
Moderate debris/material within the stomach.

Vascular/Lymphatic: No evidence of aneurysm in the left periaortic
region adjacent to the L2 level, there is irregular mixed density
necrotic adenopathy versus periaortic abscess measuring 2.7 x
cm. Areas of high density noted within this periaortic process is
well as the right psoas muscle of unknown etiology, possible
calcifications from chronic process. IVC filter in place within the
infrarenal IVC.

Reproductive: No visible focal abnormality.

Other: Small amount of free fluid adjacent to the decompressed
urinary bladder. No free air.

Musculoskeletal: Probable large decubitus ulcer noted over the
sacrum and right posterior iliac bone. Underlying sclerosis within
the right posterior iliac bone is increased since prior study and
may reflect chronic osteomyelitis. There is gas and fluid noted
within the adjacent right SI joint. This is new since prior study.
This is most compatible with septic arthritis. The adjacent iliac
bone demonstrates irregular lucency and bony destruction compatible
with adjacent osteomyelitis. Fluid and gas extend into the right
iliacus muscle as well as the iliopsoas and in the right psoas
muscle superiorly compatible with intramuscular abscesses.

Numerous abnormal vertebral bodies. Near complete destruction of the
L2 vertebral body. Areas of lucency within L4, L1, T8 most
compatible with multilevel osteomyelitis changes. Partial collapse
and sclerosis in the L5 vertebral body, likely related to chronic
osteomyelitis.
IMPRESSION: Large decubitus ulcer over the right sacrum and posterior iliac bone
with chronic osteomyelitis changes in the posterior iliac bone.
Evidence of septic arthritis within the right SI joint and acute
osteomyelitis in the adjacent right iliac bone. Changes of multi
level osteomyelitis throughout the vertebral bodies T8, L1, L2, L4,
and L5.

Intramuscular abscesses involving the psoas, iliopsoas and iliacus
muscles on the right.

Mixed density complex process in the left periaortic space adjacent
to the L2 vertebral body which could reflect necrotic and partially
calcified adenopathy or periaortic abscess.

Gastrostomy tube in expected position.

Small bilateral pleural effusions. Dense consolidation in the lower
lobes is stable since prior study and most compatible with
pneumonia.

Debris/ fluid filled dilated distal esophagus and stomach.

These results were called by telephone at the time of interpretation
on 08/26/2016 at [DATE] to Dr. MAXAMMED SEHLIN , who verbally
acknowledged these results.

## 2018-11-04 IMAGING — CR DG CHEST 1V PORT
1 series · 1 of 1 positions shown · non-contrast
Comparison: 08/13/2016

CLINICAL DATA: Altered mental status.

EXAM:
PORTABLE CHEST 1 VIEW

[AP]
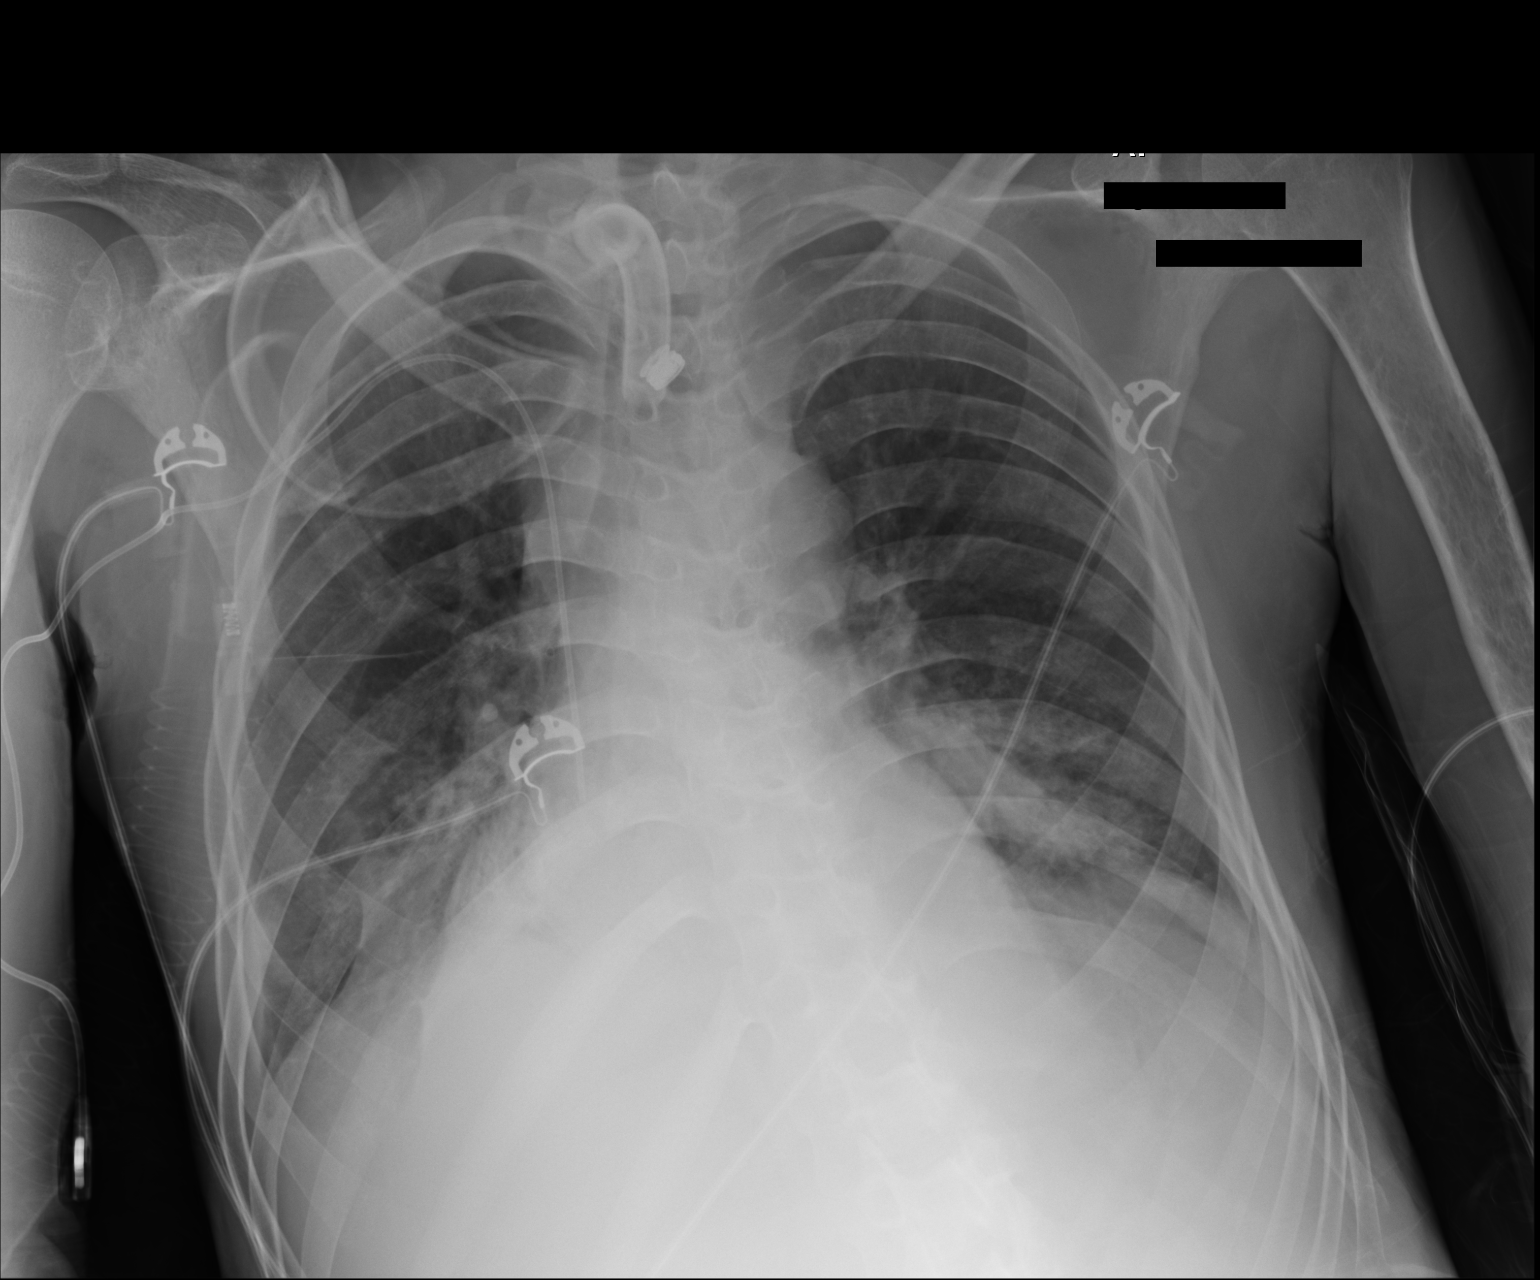

[1 of 1 positions shown; findings below may reference images not displayed]

FINDINGS: Tracheostomy tube and right PICC line remain in place, unchanged.
There is mild cardiomegaly. Bilateral lower lobe airspace opacities,
similar on the right prior study, increased on the left. No visible
effusions. No acute bony abnormality.
IMPRESSION: Bibasilar airspace opacities concerning for pneumonia, increasing on
the left since prior study.

## 2018-11-06 IMAGING — CR DG CHEST 1V PORT
2 series · 2 of 2 positions shown · non-contrast
Comparison: Portable chest x-ray of 08/26/2016

CLINICAL DATA: Tracheostomy patient, followup

EXAM:
PORTABLE CHEST 1 VIEW

[AP (1 of 2)]
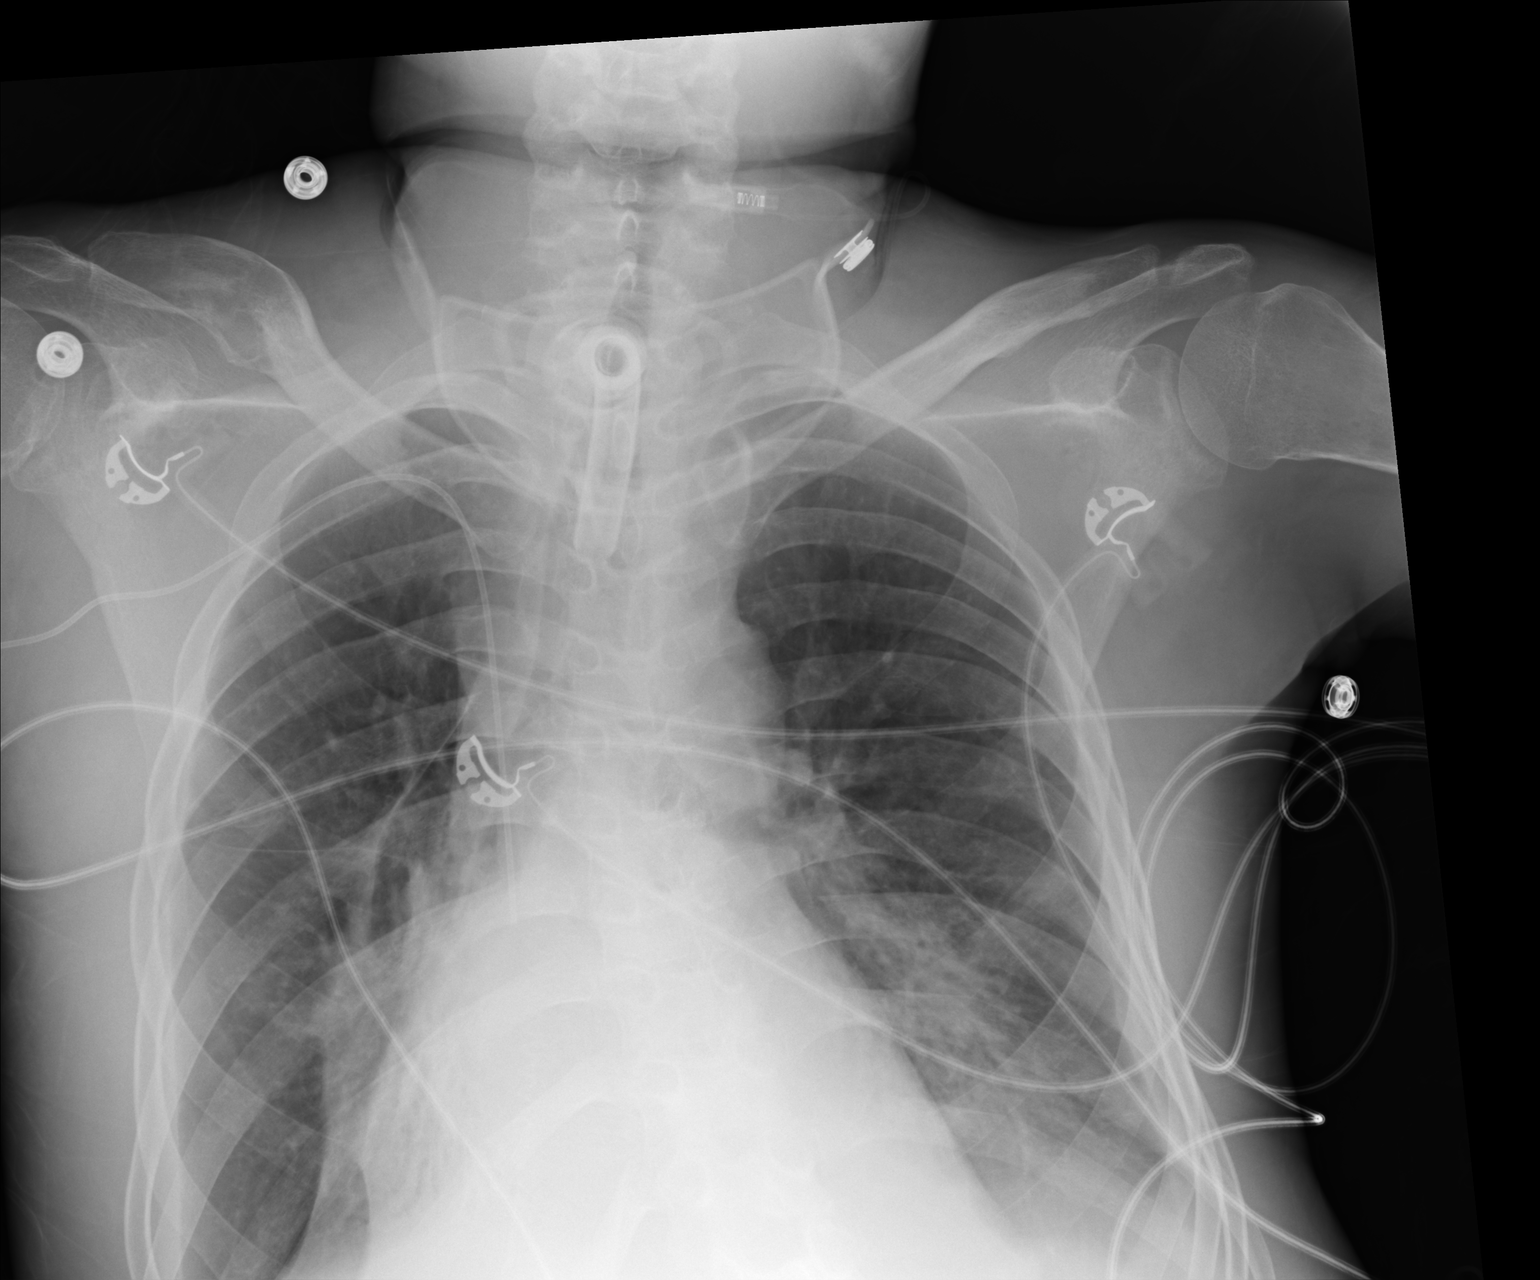

[AP (2 of 2)]
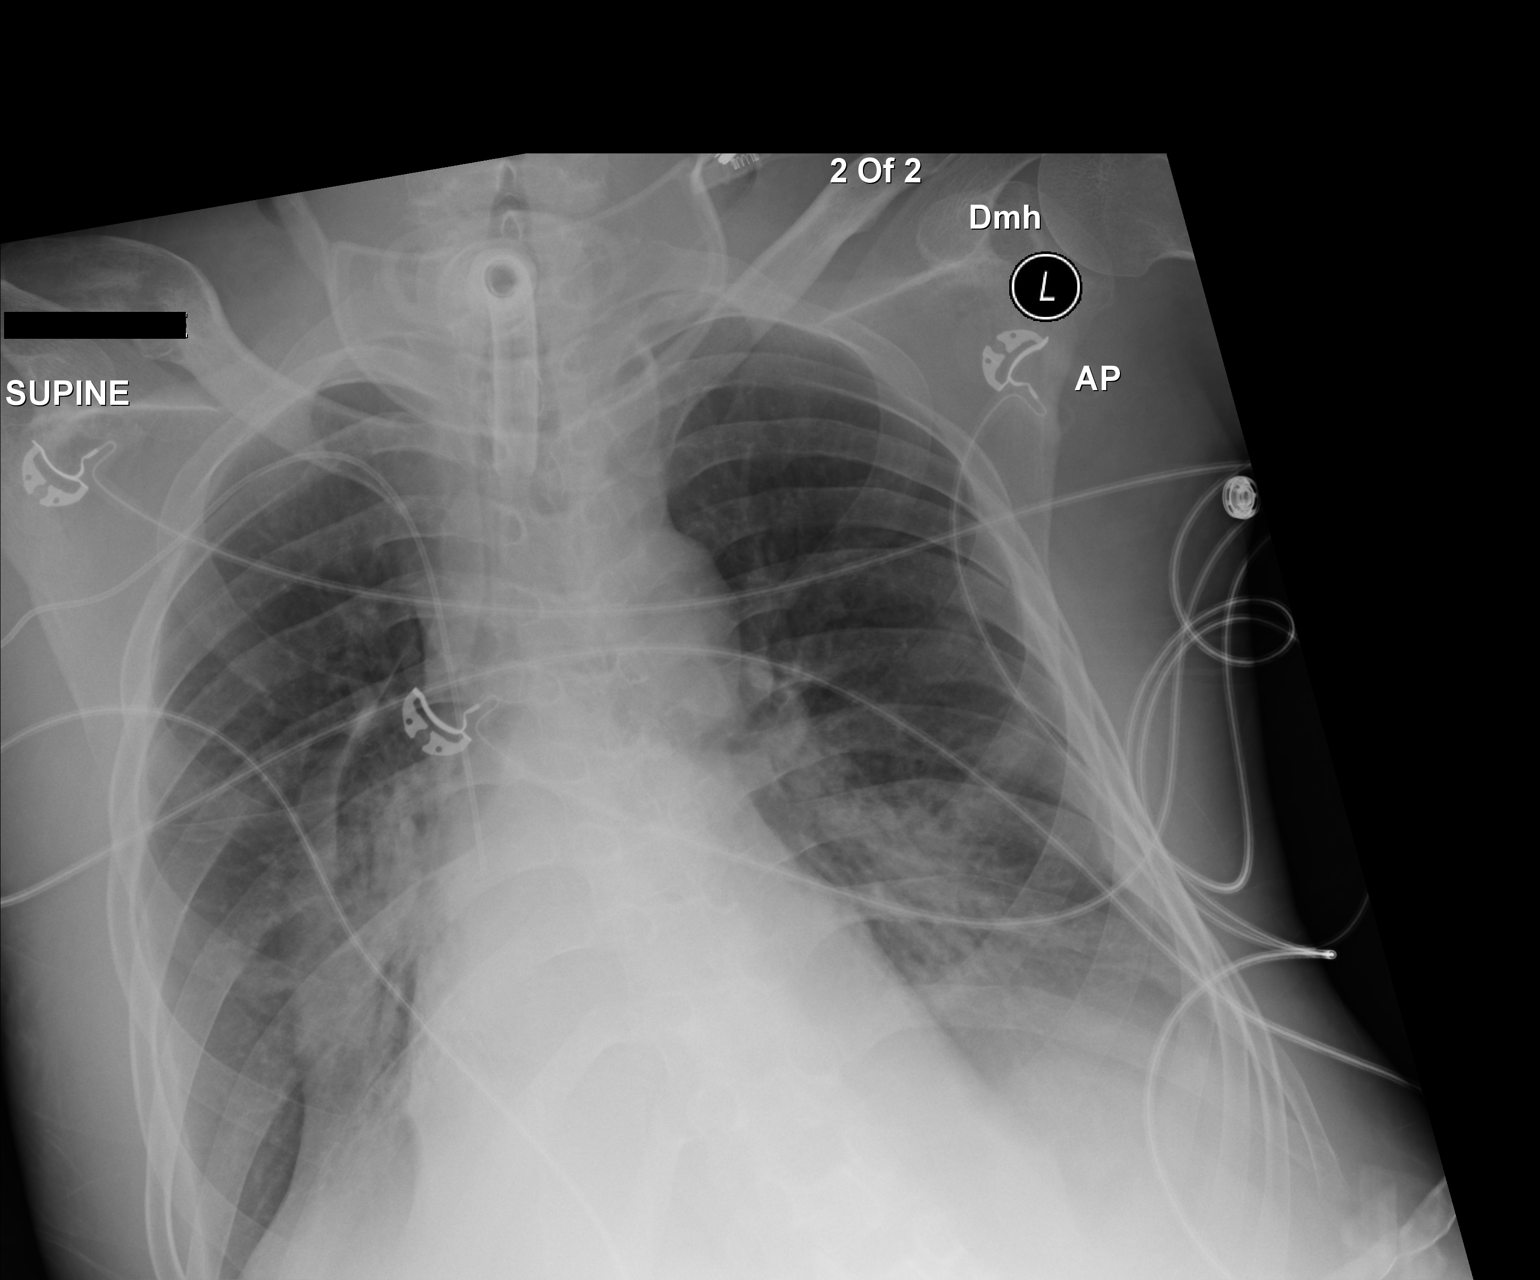

[2 of 2 positions shown; findings below may reference images not displayed]

FINDINGS: The tracheostomy remains in good position well above the carina.
Bibasilar volume loss is unchanged. Heart size is stable. Right PICC
line tip overlies the lower SVC.
IMPRESSION: 1. No significant change in bibasilar atelectasis.
2. Tracheostomy and right PICC line unchanged in position.

## 2018-11-06 IMAGING — CT CT HEAD W/O CM
3 of 4 series · 17 of 47 positions shown, 20 images · non-contrast
Comparison: CT brain scan of 08/13/2016

CLINICAL DATA: Decreased in responsiveness

EXAM:
CT HEAD WITHOUT CONTRAST
TECHNIQUE: Contiguous axial images were obtained from the base of the skull
through the vertex without intravenous contrast.

[Series 201: head w/o, idose (1) · axial · non-contrast · 0.45mm/px · z∈[+472,+597]mm · 11 of 31 slices shown, 14 images]
[im 3/31  brain]
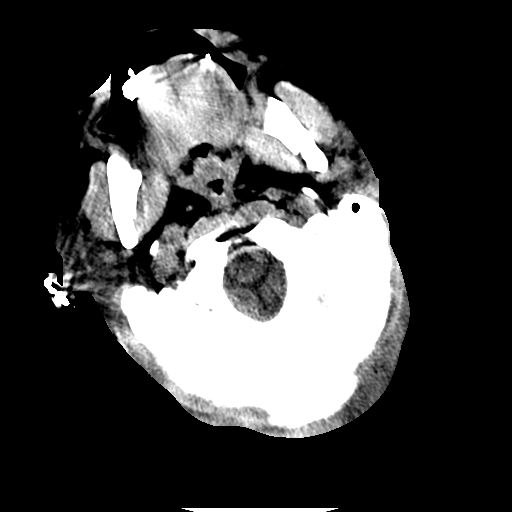
[im 3/31  bone]
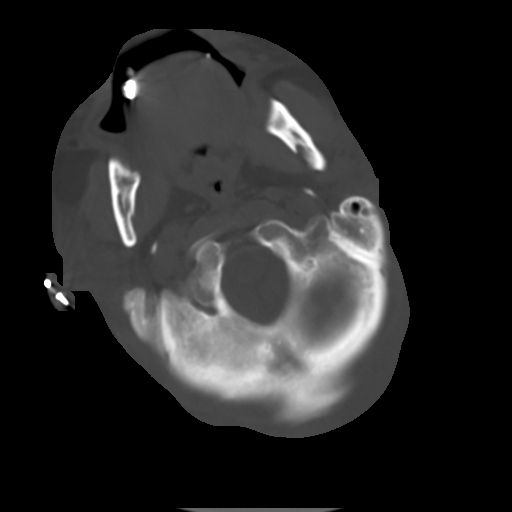
[im 5/31  brain]
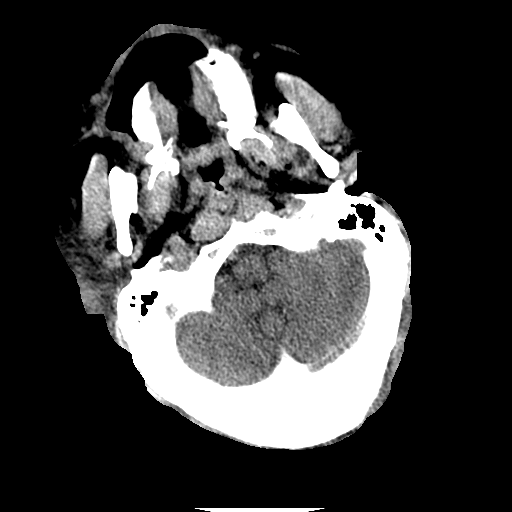
[im 7/31  brain]
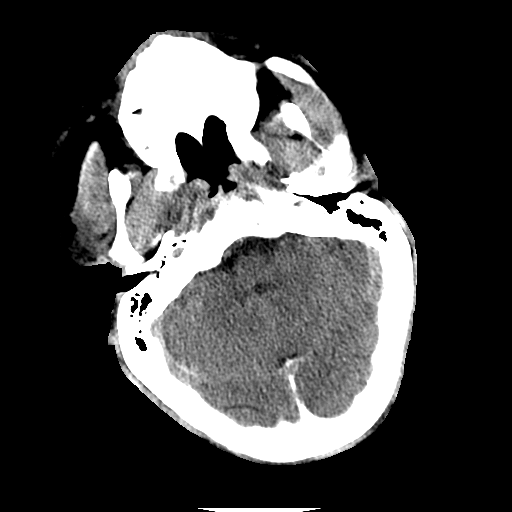
[im 11/31  brain]
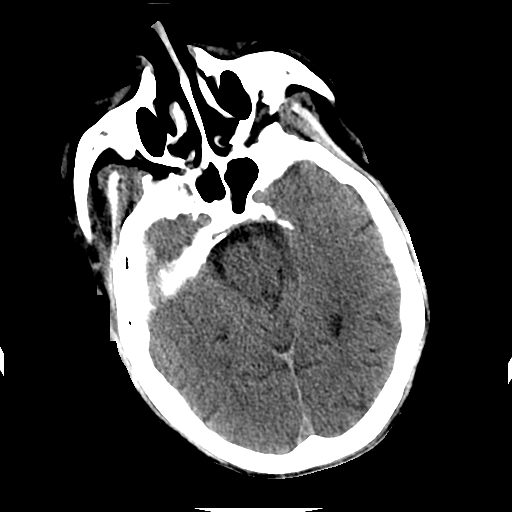
[im 13/31  brain]
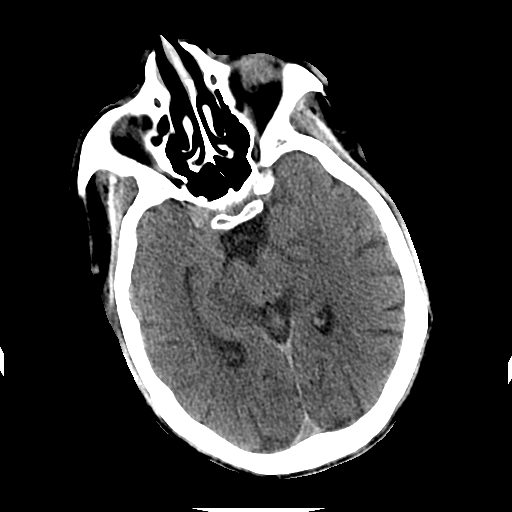
[im 13/31  bone]
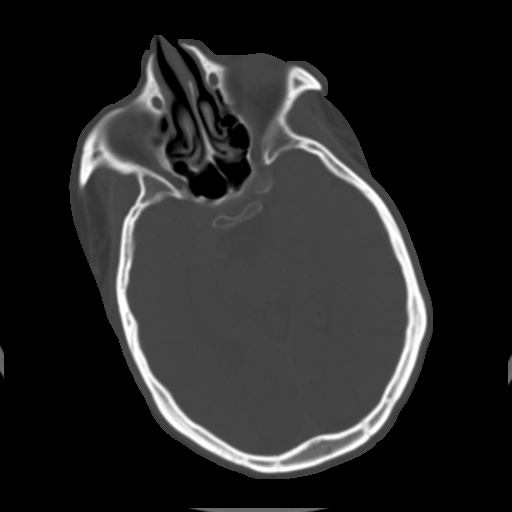
[im 16/31  brain]
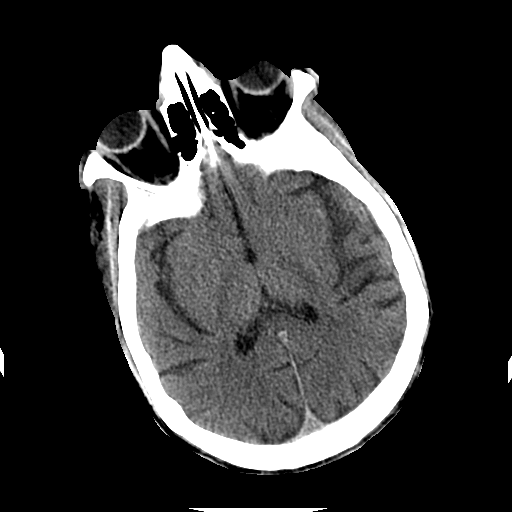
[im 18/31  brain]
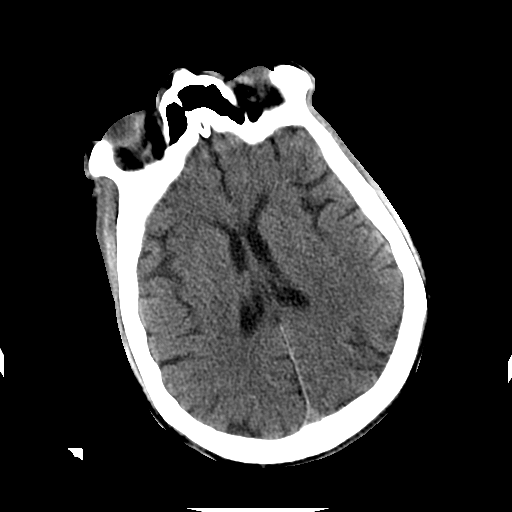
[im 20/31  brain]
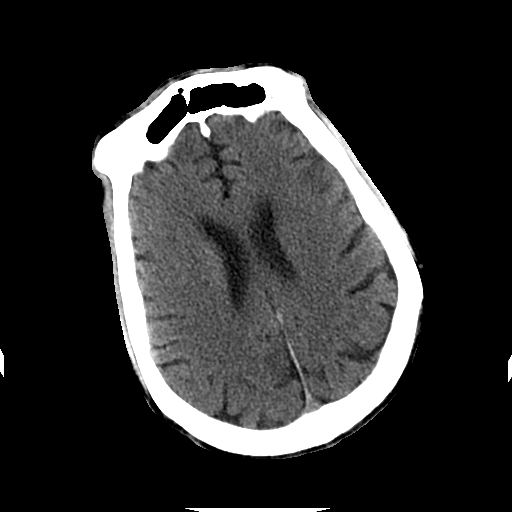
[im 24/31  brain]
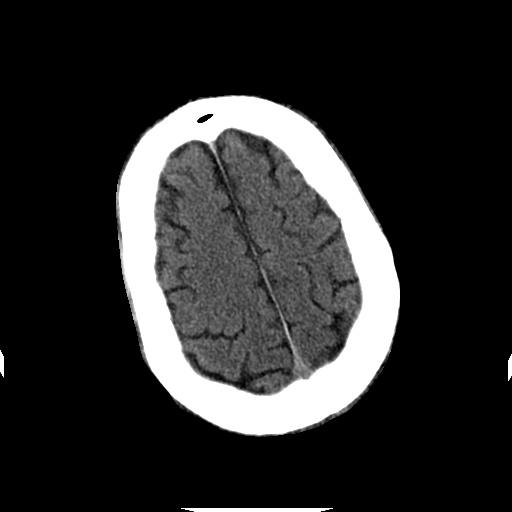
[im 24/31  bone]
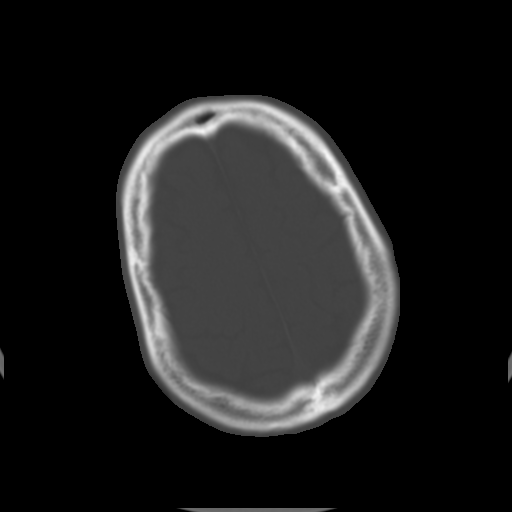
[im 26/31  brain]
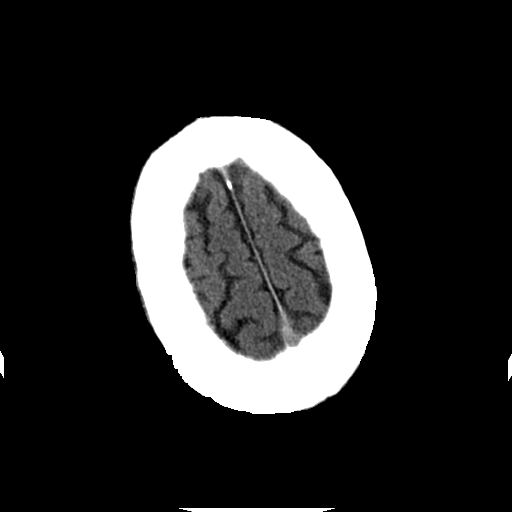
[im 28/31  brain]
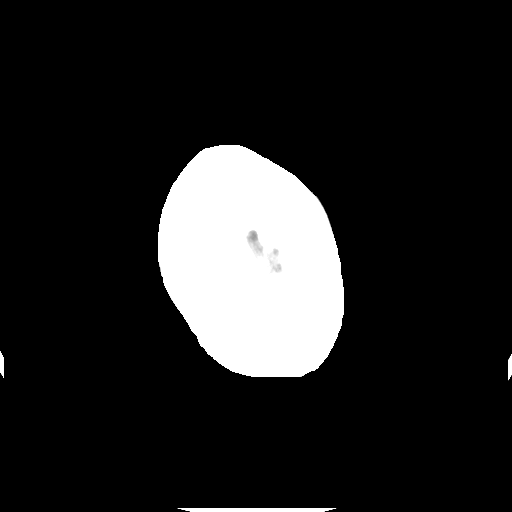

[Series 204: sagittal st, idose (1) · sagittal · 0.40mm/px · 3 of 73 slices shown]
[im 25/73  brain]
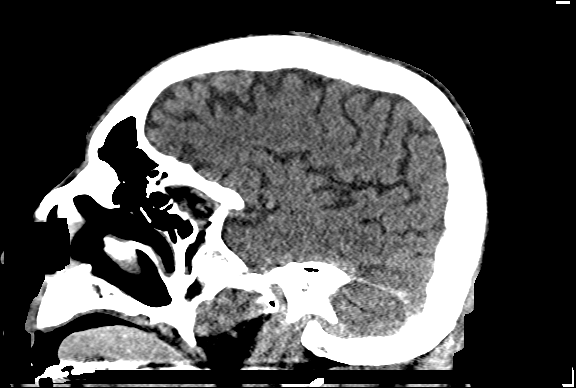
[im 37/73  brain]
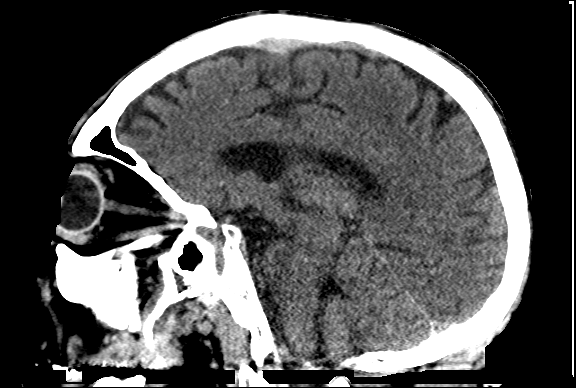
[im 49/73  brain]
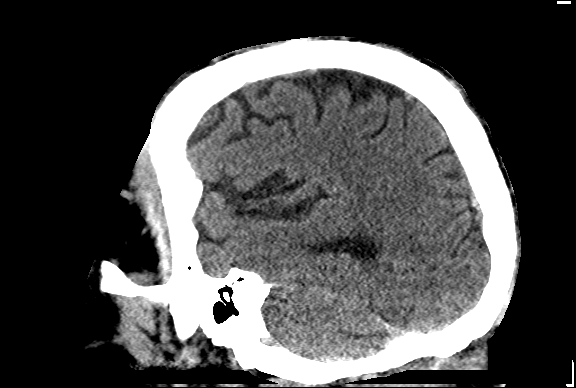

[Series 205: coronal st, idose (1) · coronal · 0.40mm/px · 3 of 72 slices shown]
[im 24/72  brain]
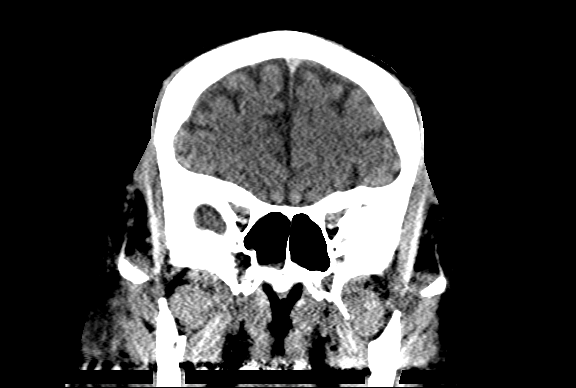
[im 32/72  brain]
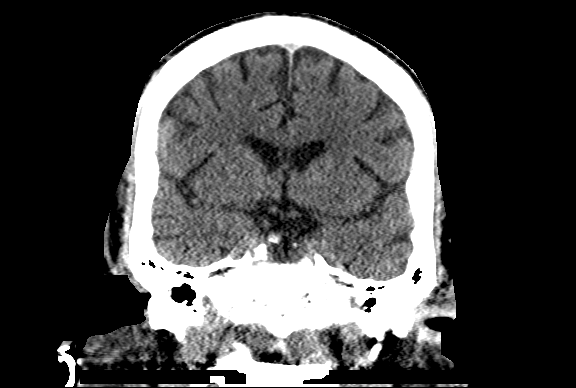
[im 40/72  brain]
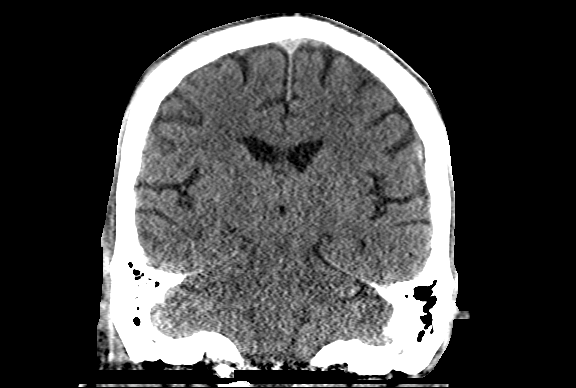

[17 of 47 positions shown; findings below may reference images not displayed]

FINDINGS: Brain: The ventricular system is unchanged and there are prominent
cortical sulci consistent with atrophy. The septum remains midline
in position. The fourth ventricle and basilar cisterns are
unremarkable. No hemorrhage, mass lesion, or acute infarction is
seen.

Vascular: No vascular abnormality is seen on this unenhanced study.

Skull: No calvarial abnormality is noted.

Sinuses/Orbits: The paranasal sinuses are pneumatized with only mild
mucosal thickening within the sphenoid sinus.

Other: None
IMPRESSION: 1. Mild atrophy.  No acute intracranial abnormality.
2. Mucosal thickening within the sphenoid sinus. No air-fluid level
is seen.

## 2018-11-07 IMAGING — CR DG CHEST 1V PORT
1 series · 1 of 1 positions shown · non-contrast
Comparison: Chest x-ray of 08/28/2016

CLINICAL DATA: Pneumonia

EXAM:
PORTABLE CHEST 1 VIEW

[portable]
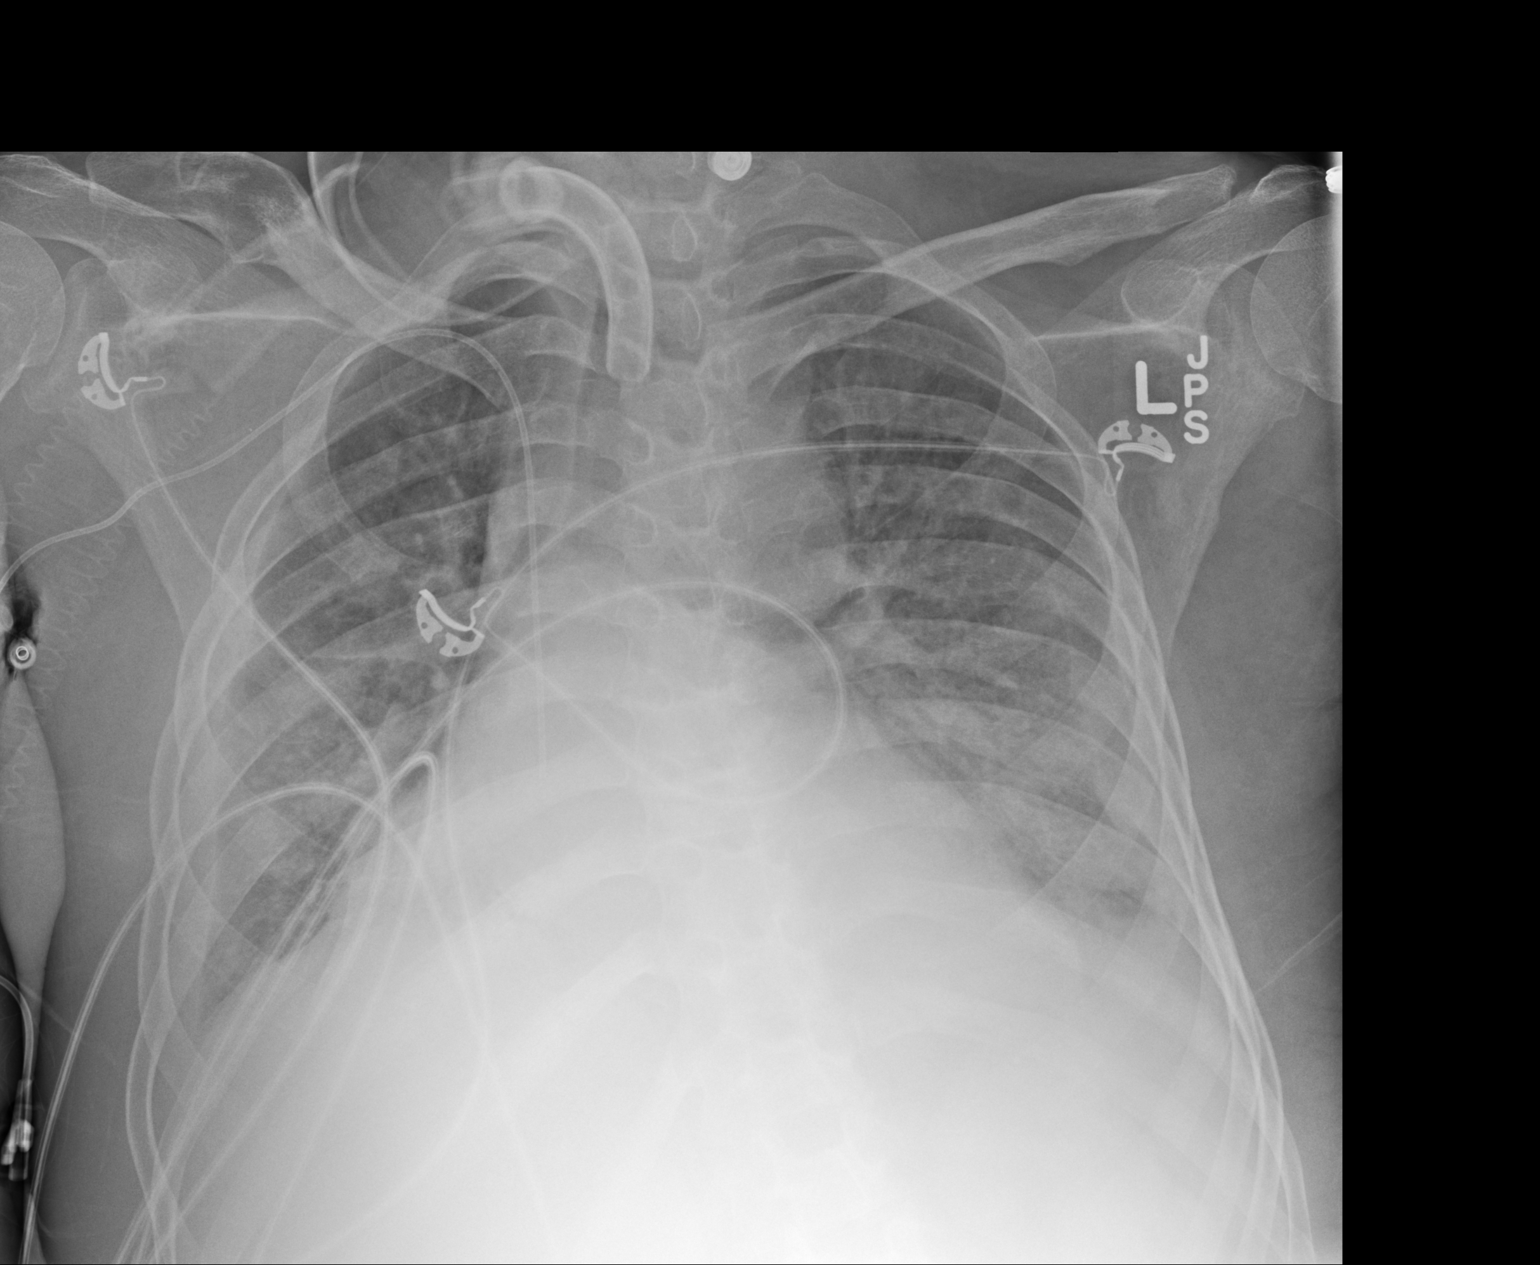

[1 of 1 positions shown; findings below may reference images not displayed]

FINDINGS: A tracheostomy is again noted. Right PICC line tip overlies the
lower SVC near the expected SVC -RA junction. There is moderate
cardiomegaly present and there does appear to be at least a right
pleural effusion. There is pulmonary vascular congestion, findings
most consistent with mild congestive heart failure. Superimposed
pneumonia would be difficult to exclude.
IMPRESSION: 1. Suspect mild CHF with cardiomegaly, pulmonary vascular
congestion, and small right pleural effusion.
2. Tracheostomy and right PICC line unchanged in position.

## 2018-11-08 IMAGING — CR DG CHEST 1V PORT
1 series · 1 of 1 positions shown · non-contrast
Comparison: One-view chest x-ray 08/29/2016

CLINICAL DATA: Respiratory failure.  Hypoxia.

EXAM:
PORTABLE CHEST 1 VIEW

[AP]
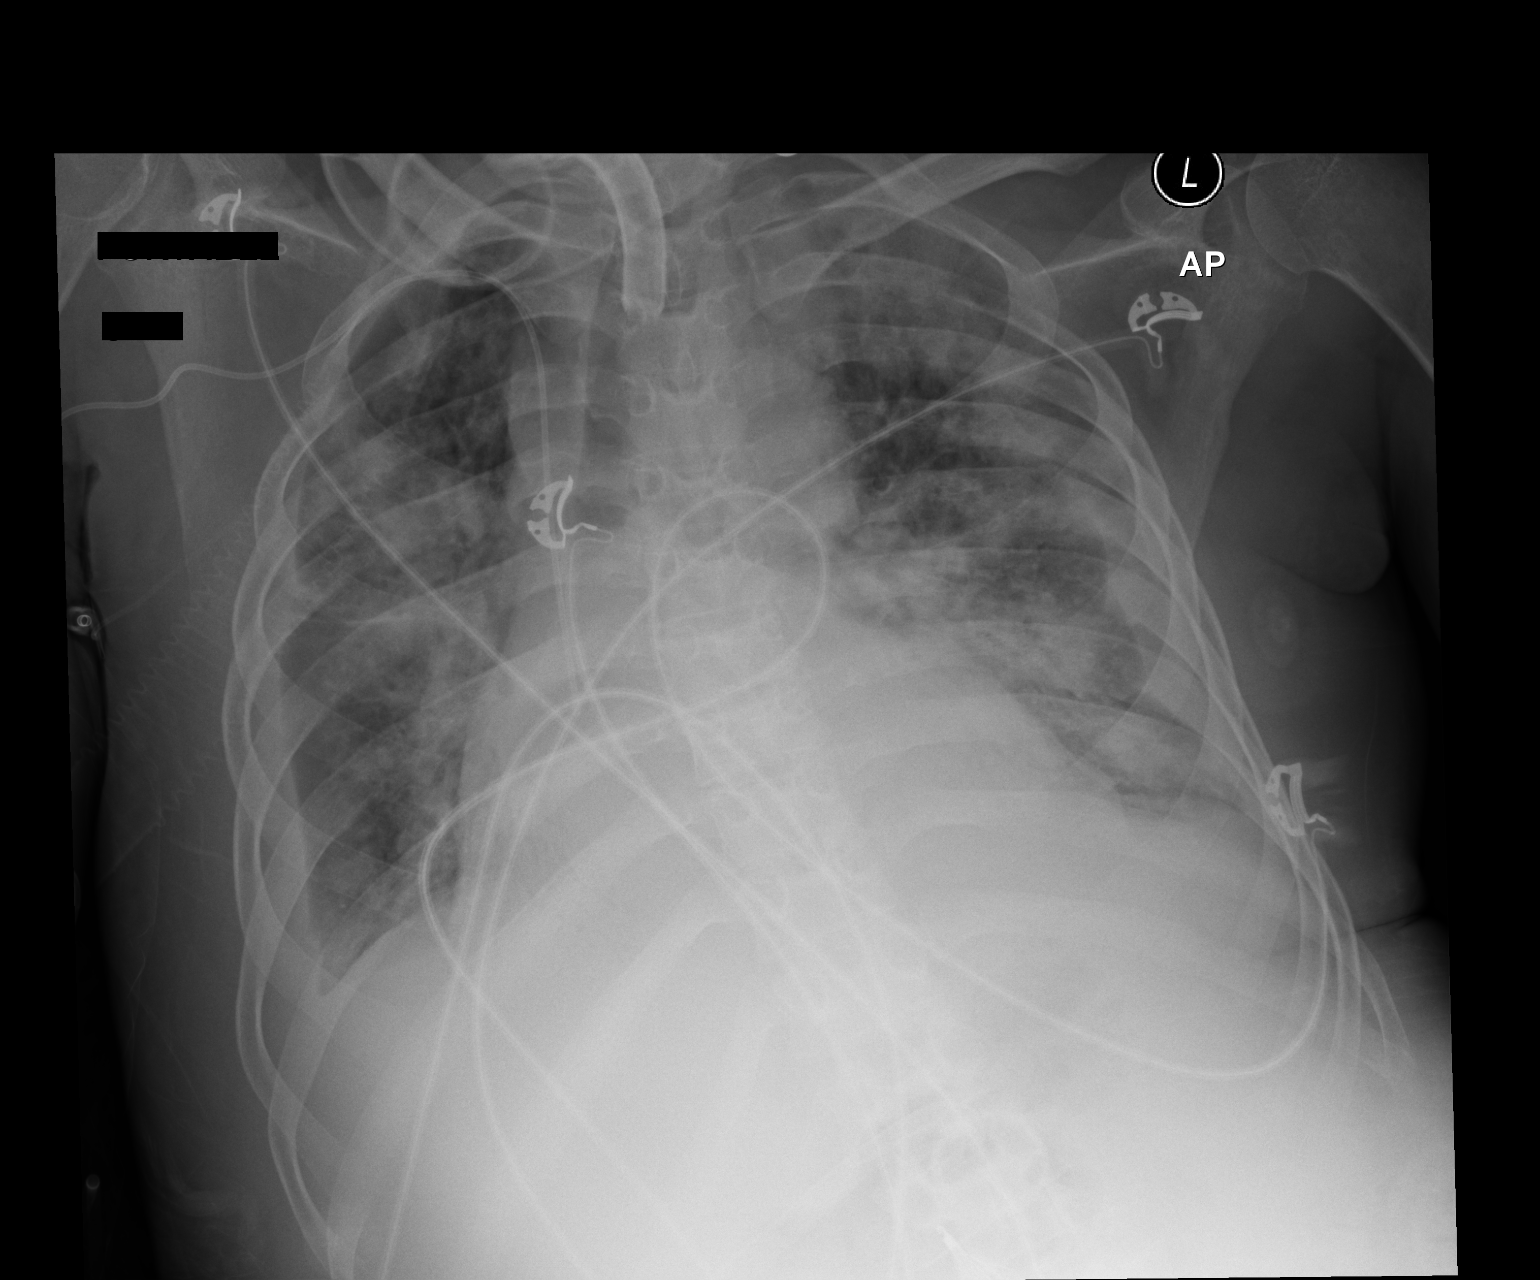

[1 of 1 positions shown; findings below may reference images not displayed]

FINDINGS: Tracheostomy tube is in place. Right-sided PICC line is stable. The
heart is enlarged. Diffuse interstitial and airspace disease is
again seen. Bilateral pleural effusions have increased.
IMPRESSION: 1. Cardiomegaly with increasing interstitial and airspace disease in
bilateral pleural effusions compatible with congestive heart
failure.
2. Increasing airspace disease likely reflects edema but infection
is not excluded.
3. Support apparatus is stable.

## 2018-11-09 IMAGING — CR DG CHEST 1V PORT
1 series · 1 of 1 positions shown · non-contrast
Comparison: 08/31/2016.

CLINICAL DATA: Respiratory failure.

EXAM:
PORTABLE CHEST 1 VIEW

[AP]
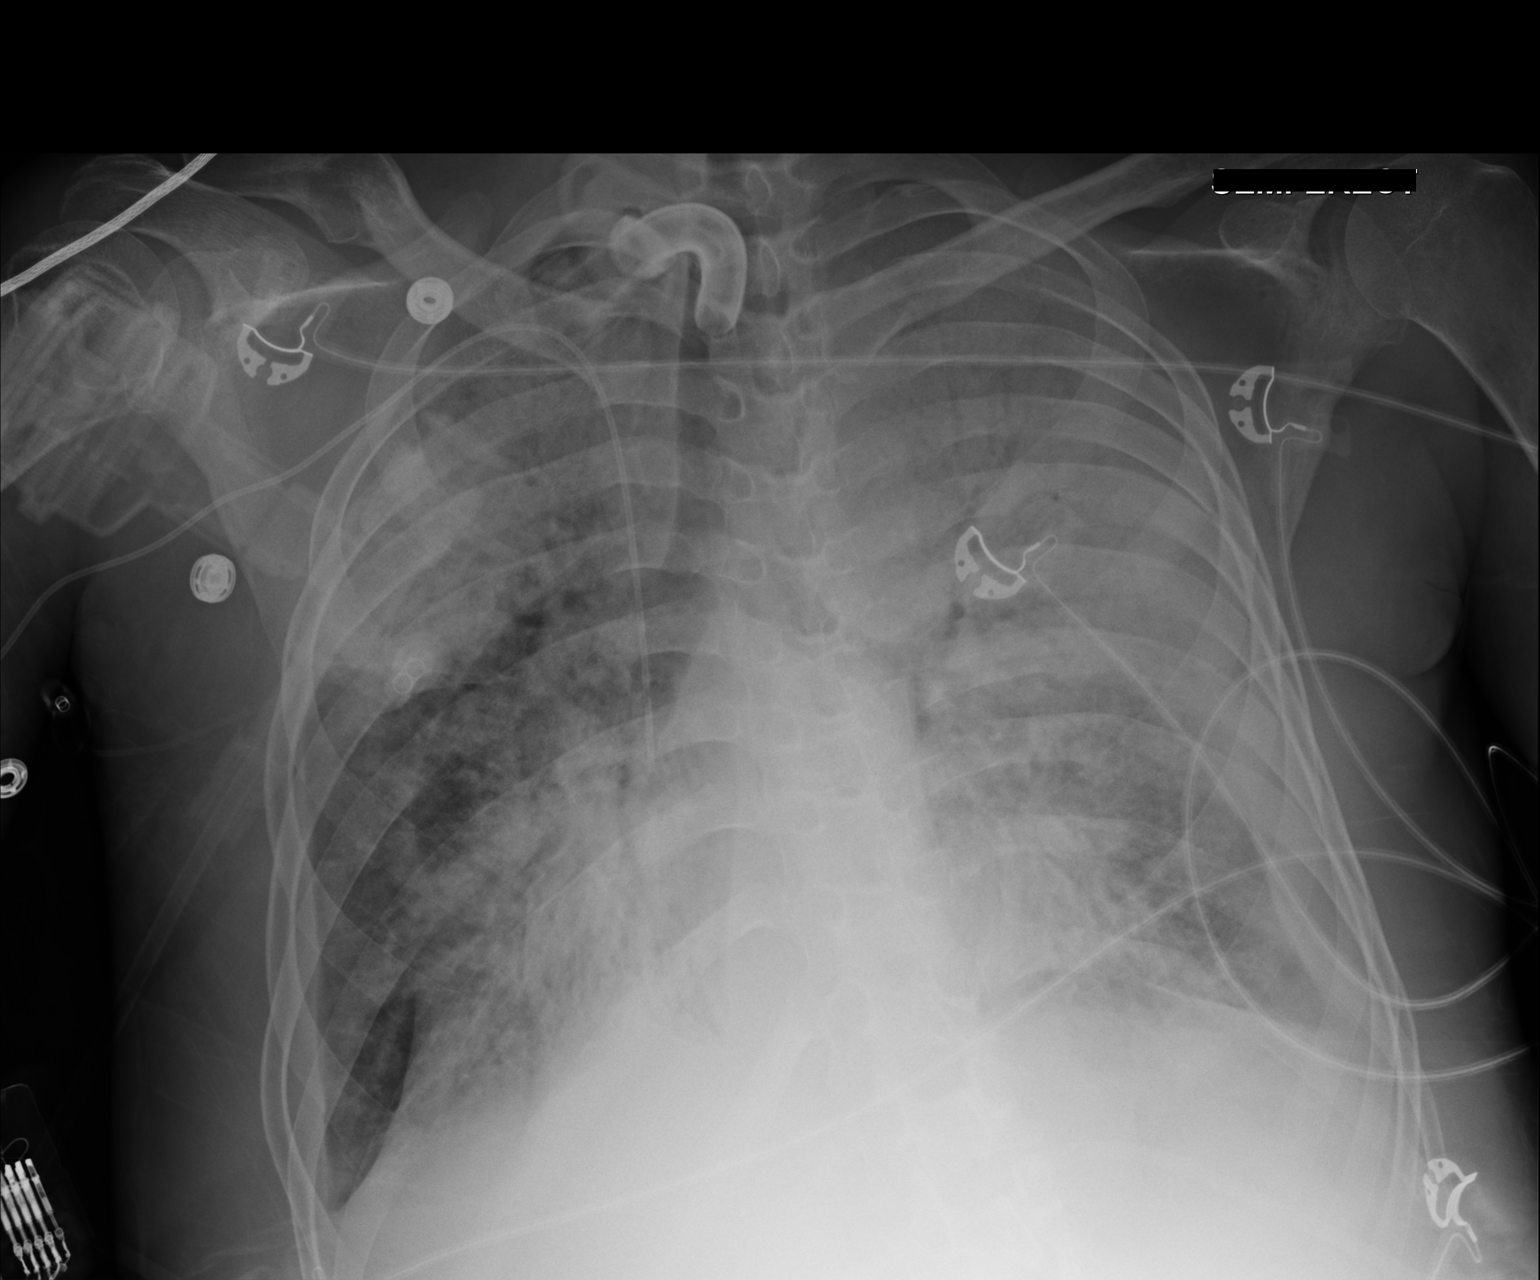

[1 of 1 positions shown; findings below may reference images not displayed]

FINDINGS: Tracheostomy tube, right PICC line stable position. Heart size
stable. Dense bilateral pulmonary infiltrates are again noted. Right
lower lobe atelectasis. Bilateral pleural effusions again are noted
inter stable.
IMPRESSION: 1.  Lines and tubes in stable position.

2. Dense bilateral pulmonary infiltrates are again noted without
interim improvement. Right lower lobe atelectasis. Unchanged small
bilateral pleural effusions.

## 2018-11-09 IMAGING — CR DG CHEST 1V PORT
1 series · 1 of 1 positions shown · non-contrast
Comparison: 08/31/2016

CLINICAL DATA: Status post central line placement

EXAM:
PORTABLE CHEST 1 VIEW

[AP]
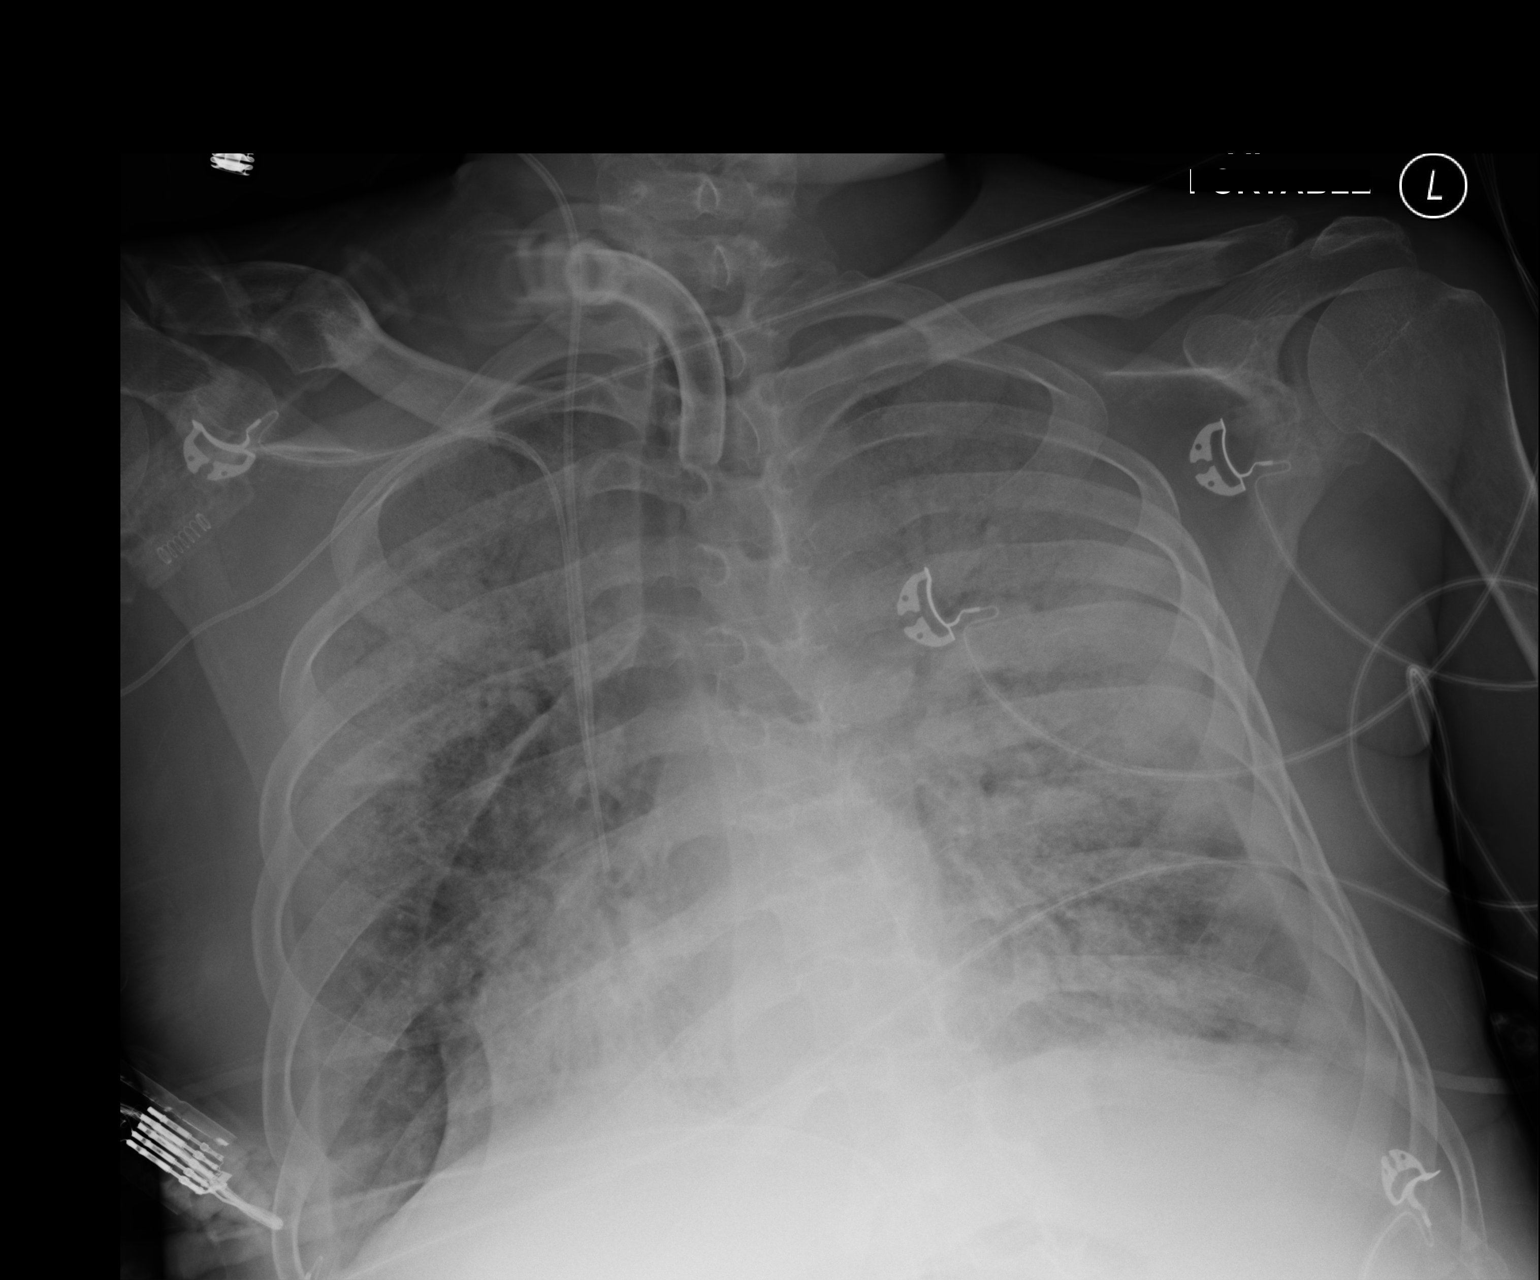

[1 of 1 positions shown; findings below may reference images not displayed]

FINDINGS: Cardiac shadow is stable. Diffuse bilateral infiltrates are again
identified predominately in the upper lobes. Tracheostomy tube and
right-sided PICC line are again seen and stable. A new right jugular
central line is noted with the tip in the mid superior vena cava. No
pneumothorax is noted.
IMPRESSION: Status post right jugular central line placement with the tip in the
mid superior vena cava. No pneumothorax is noted. The remainder of
the exam is stable from the previous study.

## 2018-11-11 IMAGING — CR DG CHEST 1V PORT
1 series · 1 of 1 positions shown · non-contrast
Comparison: 08/31/2016 and earlier.

CLINICAL DATA: 30-year-old male central line placement. Initial
encounter.

EXAM:
PORTABLE CHEST 1 VIEW

[AP]
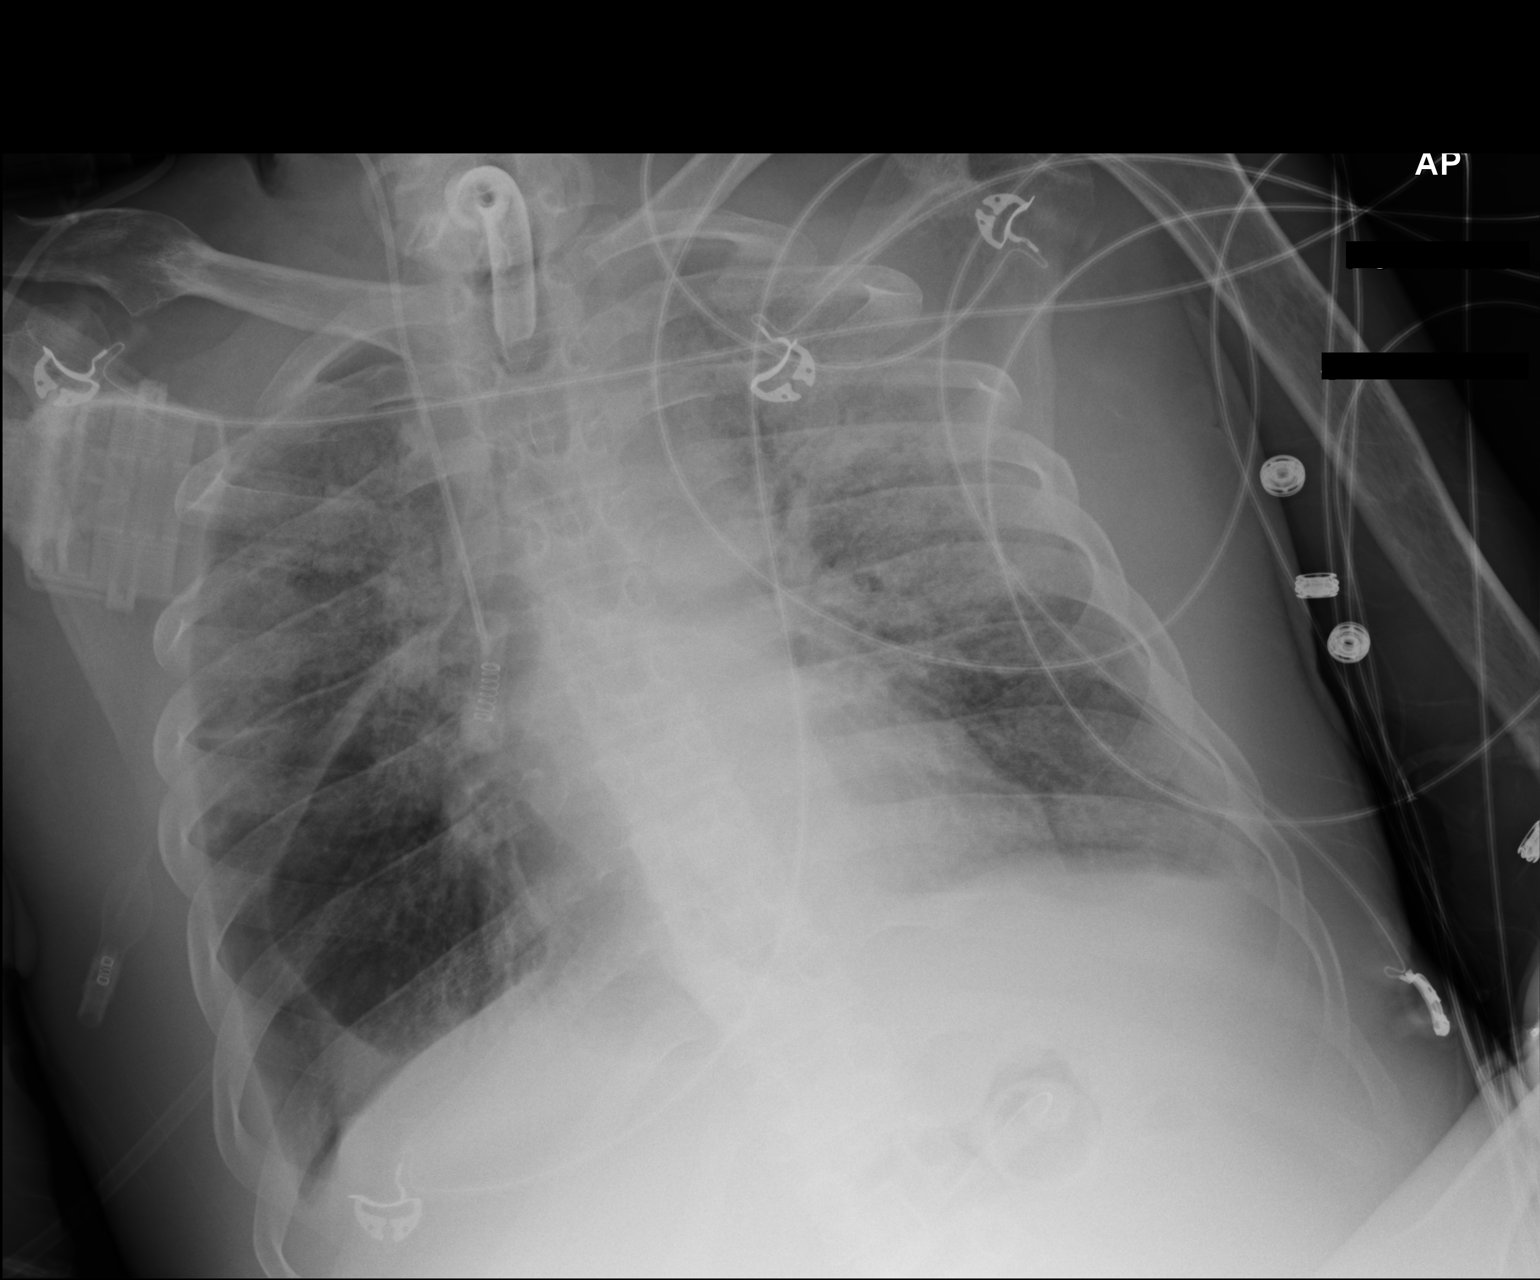

[1 of 1 positions shown; findings below may reference images not displayed]

FINDINGS: Portable AP semi upright view at at 2721 hours. The right subclavian
approach PICC line has been removed. A right IJ approach central
line remains in place, an the tip projects just below the level of
the carina at the lower SVC level. No pneumothorax.

Stable tracheostomy tube. Regressed but not completely resolved
right upper lobe confluent airspace opacity. Mildly improved
widespread left lung airspace opacity, significant upper lung
residual. There is evidence of a small layering right pleural
effusion. Stable cardiac size and mediastinal contours. Percutaneous
gastrostomy tube visible with paucity of bowel gas in the upper
abdomen. Stable visualized osseous structures.
IMPRESSION: 1. Right IJ central line tip at the level of the lower SVC. No
pneumothorax. Right PICC line removed.
2. Improved bilateral upper lobe predominant airspace disease
compatible with improving pneumonia, but significant residual left
greater than right. Small right pleural effusion.

## 2018-11-12 IMAGING — XA IR REPLACE G-TUBE/COLONIC TUBE
4 series · 8 of 8 positions shown · non-contrast
Comparison: none

INDICATION: 30-year-old male with malfunctioning gastrostomy tube. He has been
referred for evaluation for replacement.

[Series 1: fl (-) angio · 1 of 1 slices shown (1 of 4)]
[im 1/1]
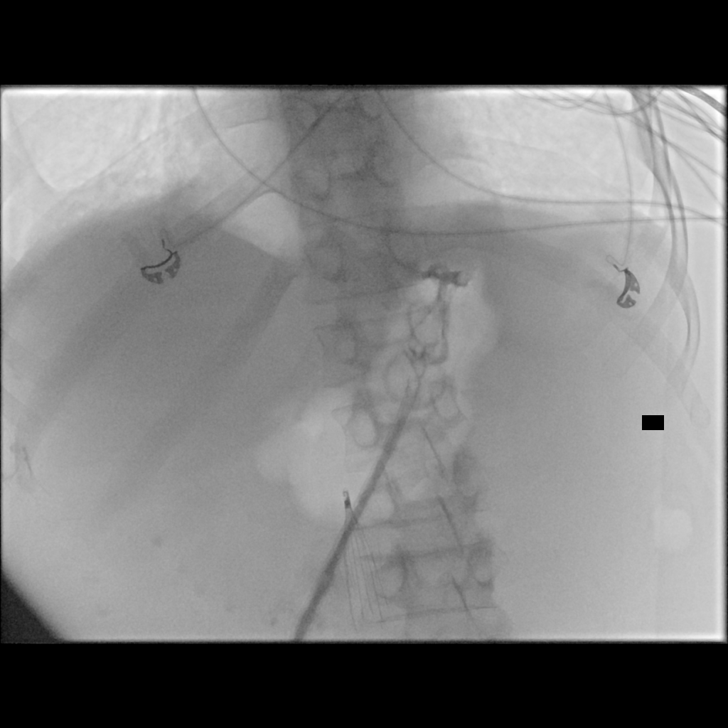

[Series 2: fl (-) angio · 1 of 1 slices shown (2 of 4)]
[im 1/1]
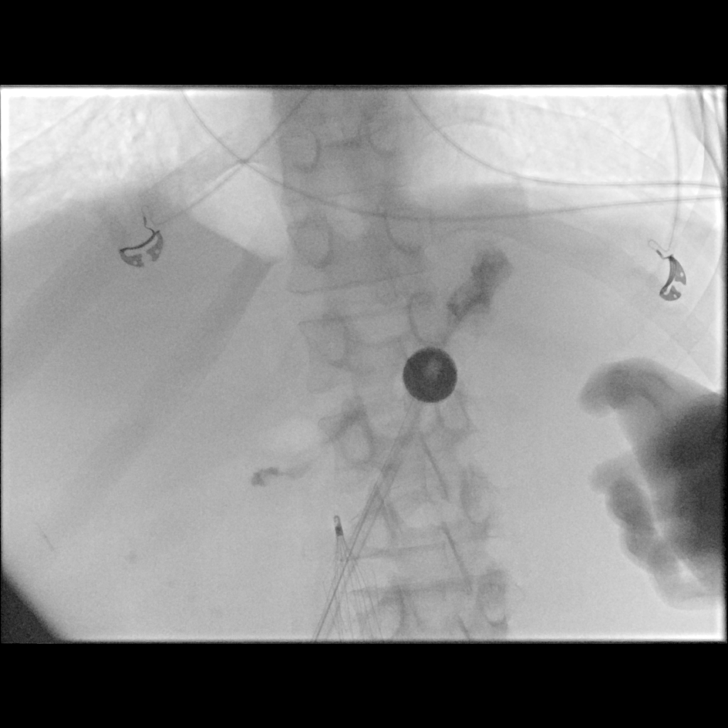

[Series 3: fl (-) angio · 1 of 1 slices shown (3 of 4)]
[im 1/1]
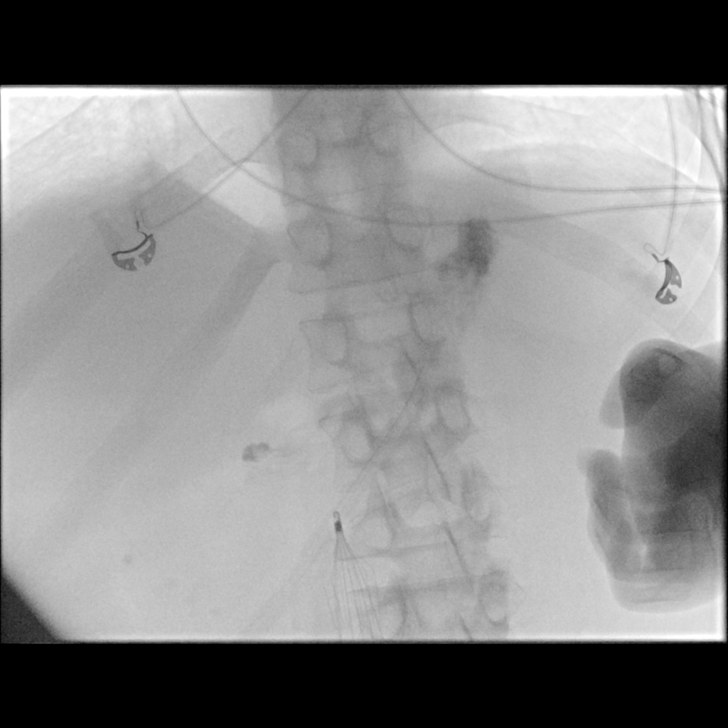

[Series 4: fl (-) angio · 5 of 5 slices shown (4 of 4)]
[im 1/5]
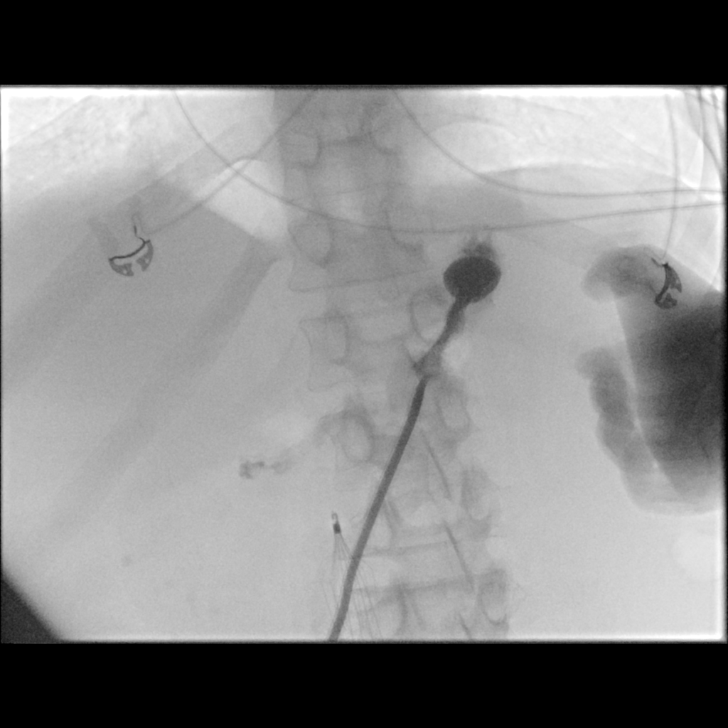
[im 2/5]
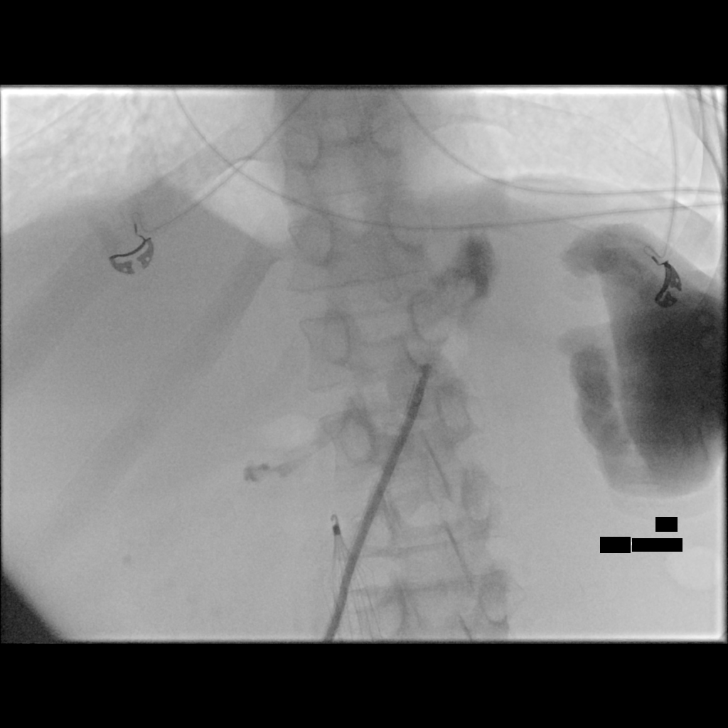
[im 3/5]
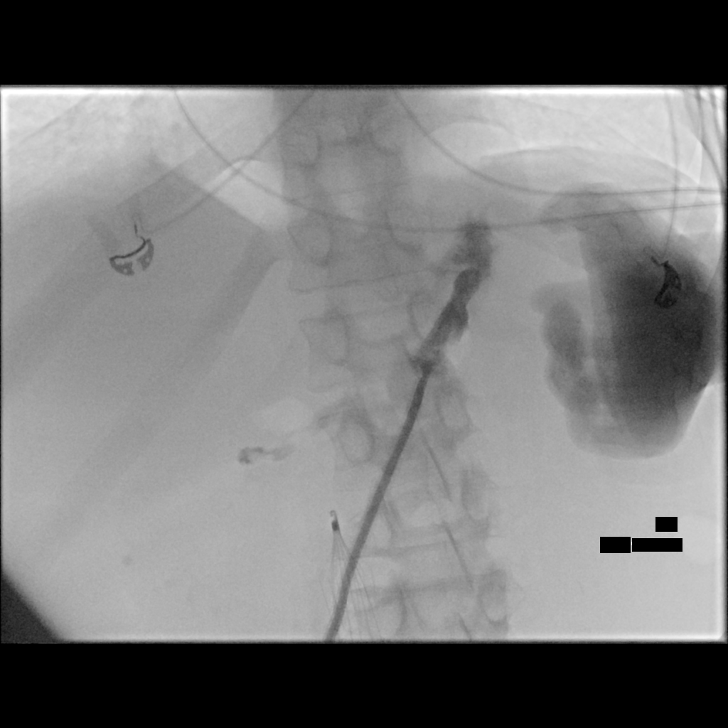
[im 4/5]
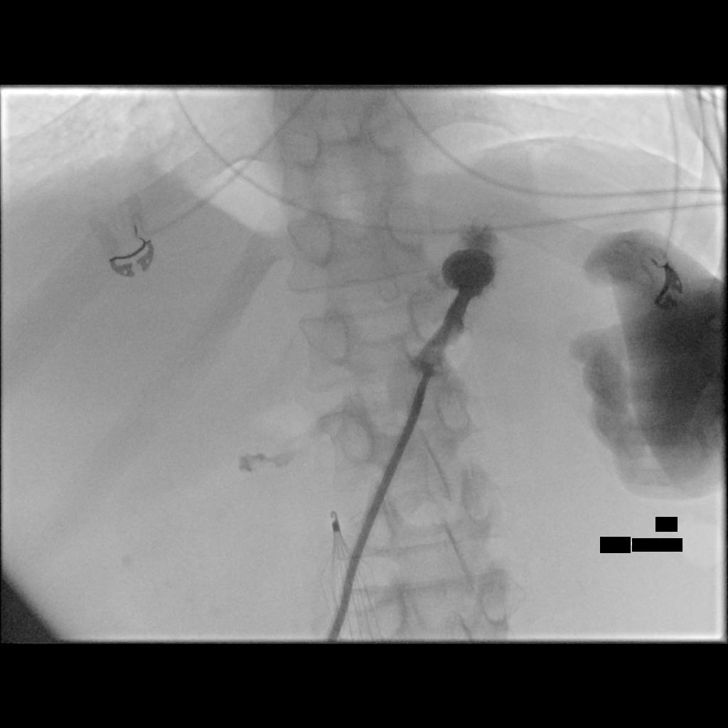
[im 5/5]
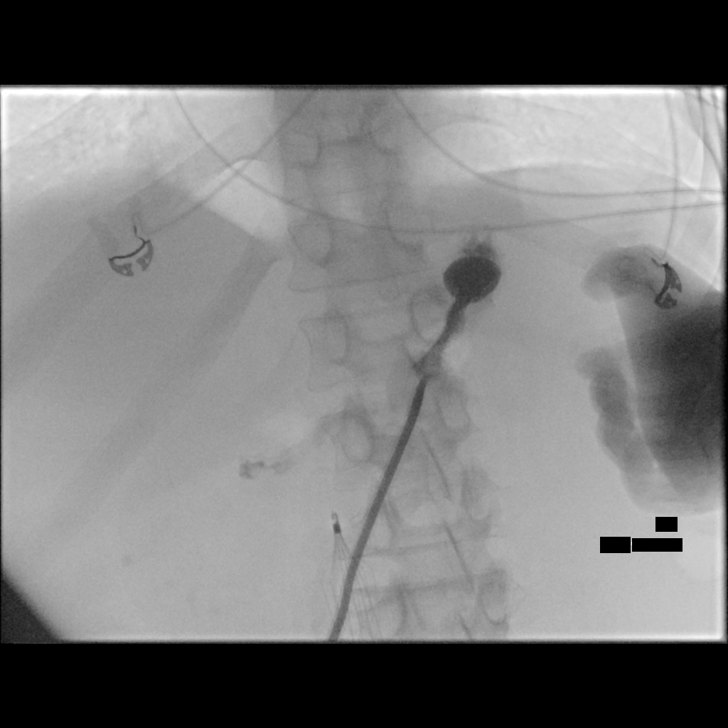

[8 of 8 positions shown; findings below may reference images not displayed]

EXAM:
GASTROSTOMY CATHETER REPLACEMENT

MEDICATIONS:
None

ANESTHESIA/SEDATION:
None

CONTRAST:  10 cc - administered into the gastric lumen.

FLUOROSCOPY TIME:  Fluoroscopy Time: 0 minutes 18 seconds (0.5 mGy).

COMPLICATIONS:
None

PROCEDURE:
Informed written consent was obtained from the patient's family
after a thorough discussion of the procedural risks, benefits and
alternatives. All questions were addressed. Maximal Sterile Barrier
Technique was utilized including caps, mask, sterile gowns, sterile
gloves, sterile drape, hand hygiene and skin antiseptic. A timeout
was performed prior to the initiation of the procedure.

Patient is placed supine position on the fluoroscopy table. 035 wire
was placed through the indwelling gastrostomy tube.

Gastrostomy tube was removed with manual traction. Tube was removed
in its entirety.

Over the wire, a new 20 French balloon retention was placed.

Wire was removed and the balloon is for latent with 8 cc of saline.

Injection of contrast confirmed location within the stomach lumen.

Patient tolerated the procedure well and remained hemodynamically
stable throughout.

No complications were encountered and no significant blood loss.
IMPRESSION: Status post gastrostomy tube exchange, with placement of a new 20
French balloon retention tube.

## 2018-11-13 IMAGING — DX DG CHEST 1V PORT
1 series · 1 of 1 positions shown · non-contrast
Comparison: Portable chest x-ray September 02, 2016

CLINICAL DATA: Acute respiratory failure. History of asthma,
previous CVA, former smoker.

EXAM:
PORTABLE CHEST 1 VIEW

[chest ap]
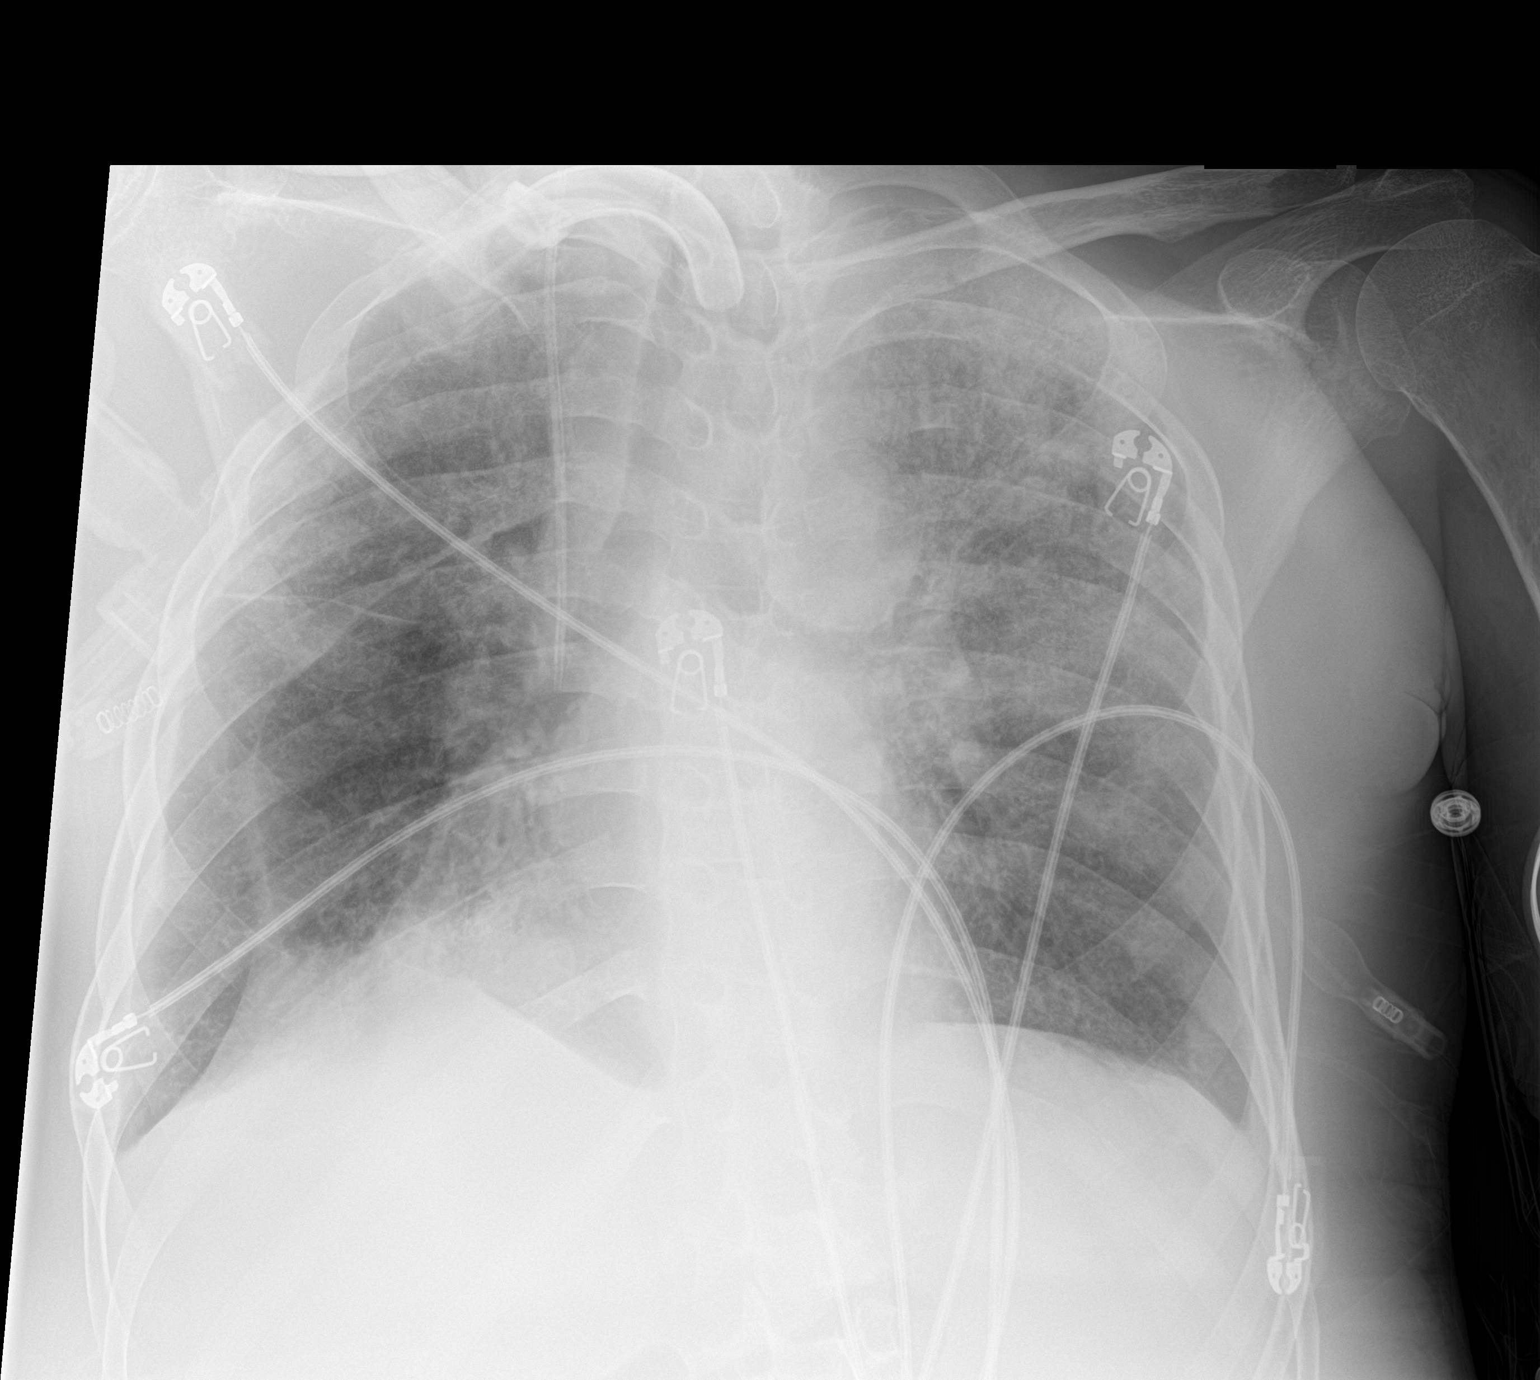

[1 of 1 positions shown; findings below may reference images not displayed]

FINDINGS: The lungs are well-expanded. There are confluent interstitial and
alveolar opacities in the left lung and to a lesser extent in the
right upper lobe. There is tenting of the right hemidiaphragm. There
is a small pleural effusion on the right. The cardiac silhouette is
normal in size. The pulmonary vascularity is indistinct. The
tracheostomy appliance tip projects between the clavicular heads.
The right internal jugular venous catheter tip projects over the
midportion of the SVC. The observed bony thorax exhibits no acute
abnormality.
IMPRESSION: Fairly stable bilateral interstitial and alveolar opacities
consistent with pneumonia. Small right pleural effusion, stable. No
overt CHF.

## 2018-11-16 IMAGING — DX DG CHEST 1V PORT
1 series · 1 of 1 positions shown · non-contrast
Comparison: Chest radiograph performed 09/04/2016

CLINICAL DATA: Acute onset of rhonchi.  Initial encounter.

EXAM:
PORTABLE CHEST 1 VIEW

[chest ap]
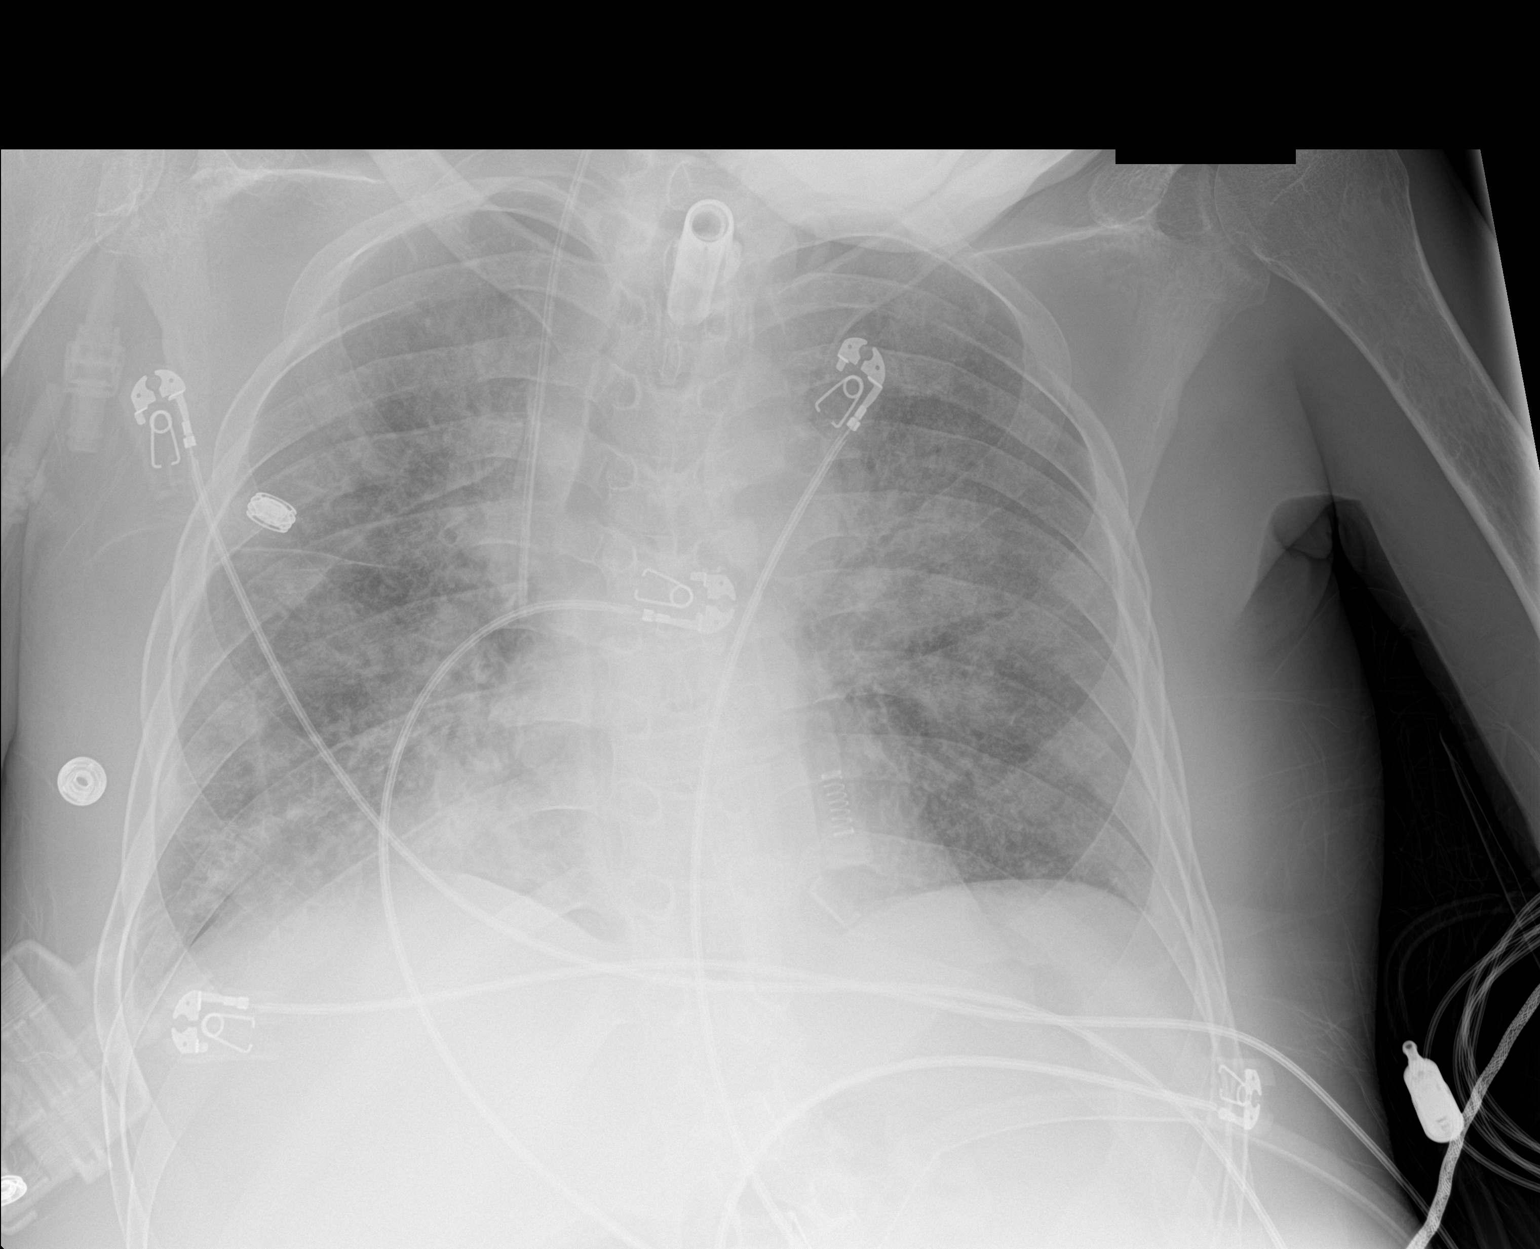

[1 of 1 positions shown; findings below may reference images not displayed]

FINDINGS: The patient's tracheostomy tube is seen ending 5-6 cm above the
carina. A right IJ line is noted ending about the mid SVC.

Vascular congestion is noted. Bilateral central airspace opacities
may reflect pneumonia or pulmonary edema, more prominent than on the
prior study. No definite pleural effusion or pneumothorax is seen.

The cardiomediastinal silhouette is borderline normal in size. No
acute osseous abnormalities are identified.
IMPRESSION: Vascular congestion. Bilateral central airspace opacities may
reflect pulmonary edema or pneumonia, more prominent than on the
prior study.

## 2018-11-20 IMAGING — CT CT HEAD W/O CM
3 of 4 series · 18 of 47 positions shown, 21 images · non-contrast
Comparison: Head CTs 08/28/2016 and earlier.  Brain MRI 08/17/2014.

CLINICAL DATA: 30-year-old Quadriplegic male with seizure activity
yesterday and fever of unknown origin. Initial encounter.

EXAM:
CT HEAD WITHOUT CONTRAST
TECHNIQUE: Contiguous axial images were obtained from the base of the skull
through the vertex without intravenous contrast.

[Series 201: head w/o, idose (1) · axial · non-contrast · 0.44mm/px · z∈[+100,+220]mm · 12 of 29 slices shown, 15 images]
[im 3/29  brain]
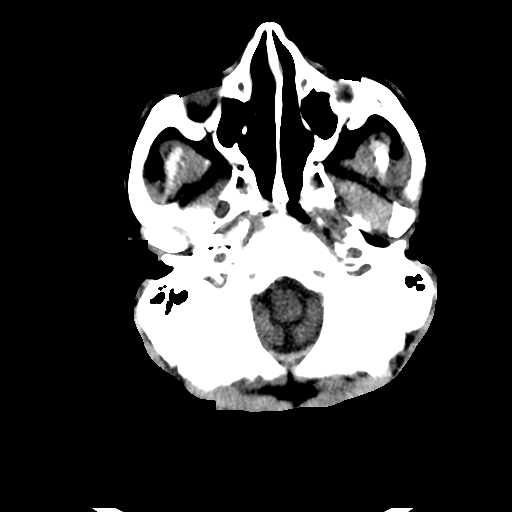
[im 3/29  bone]
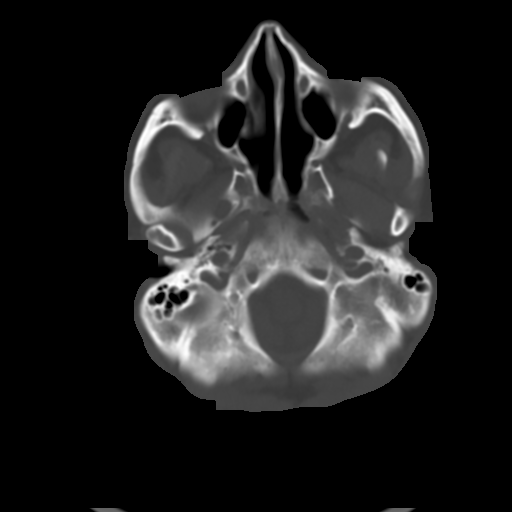
[im 5/29  brain]
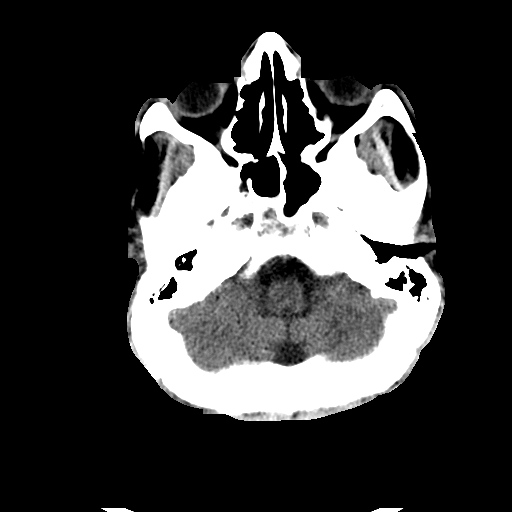
[im 7/29  brain]
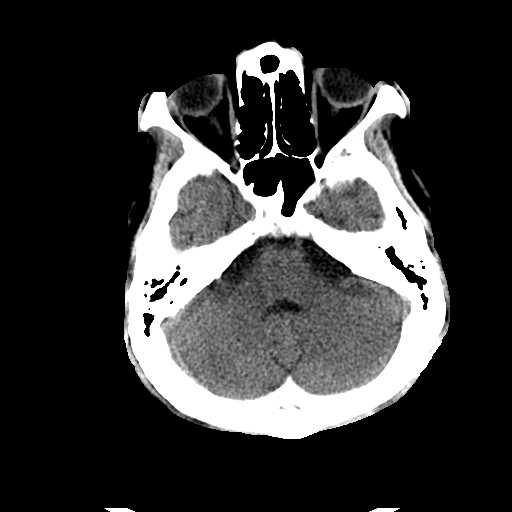
[im 9/29  brain]
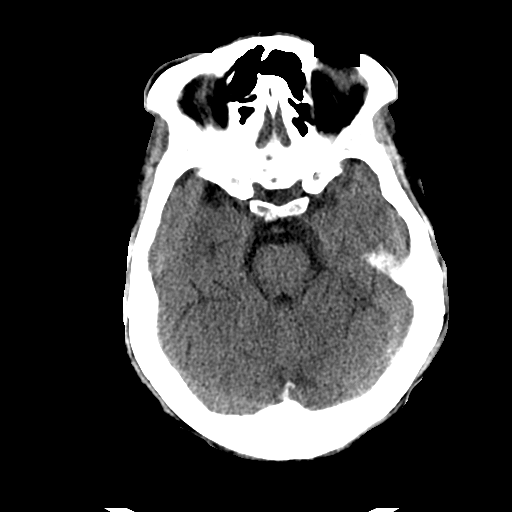
[im 11/29  brain]
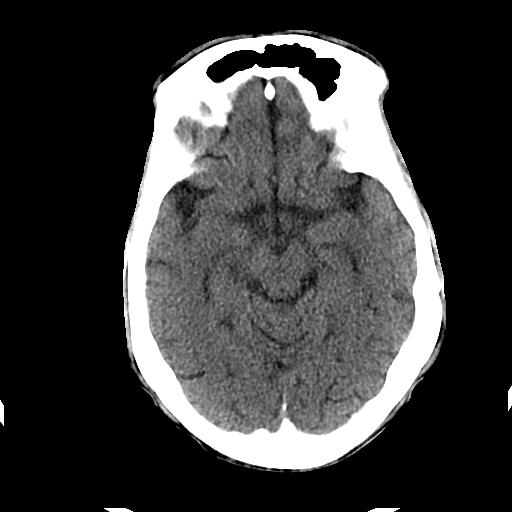
[im 11/29  bone]
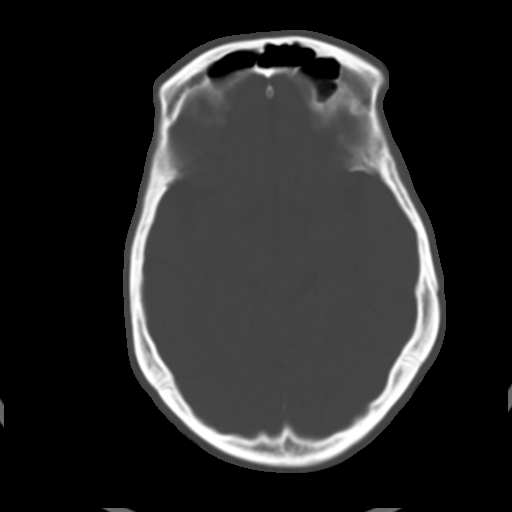
[im 13/29  brain]
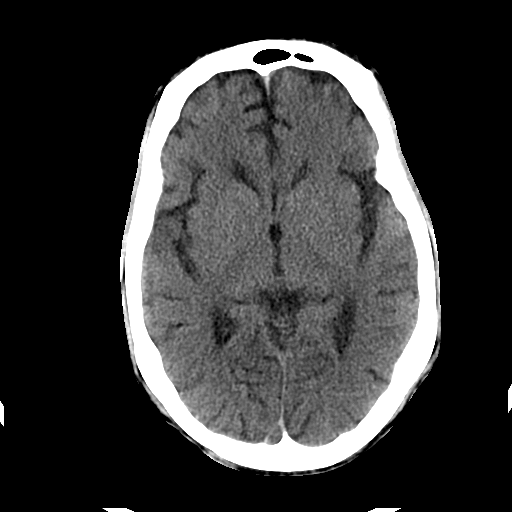
[im 17/29  brain]
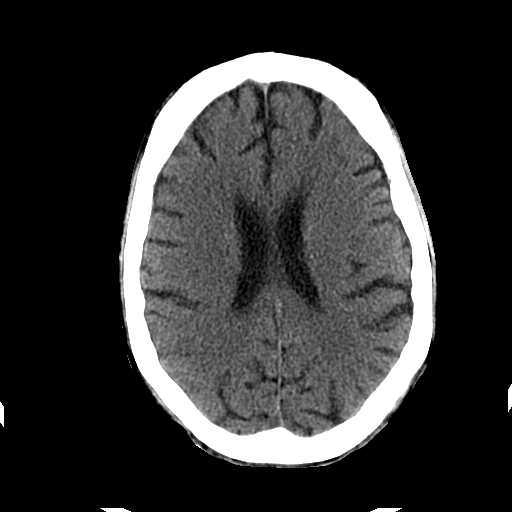
[im 19/29  brain]
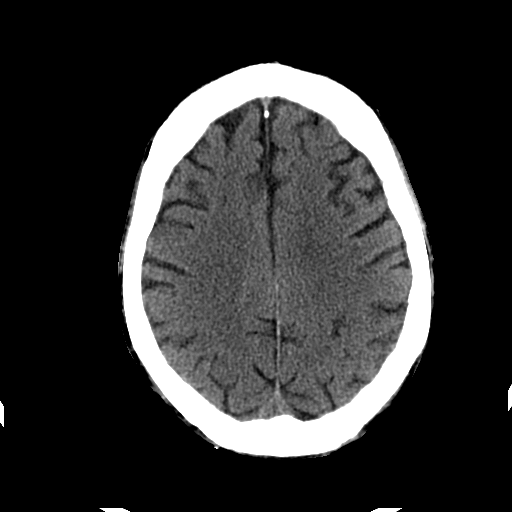
[im 21/29  brain]
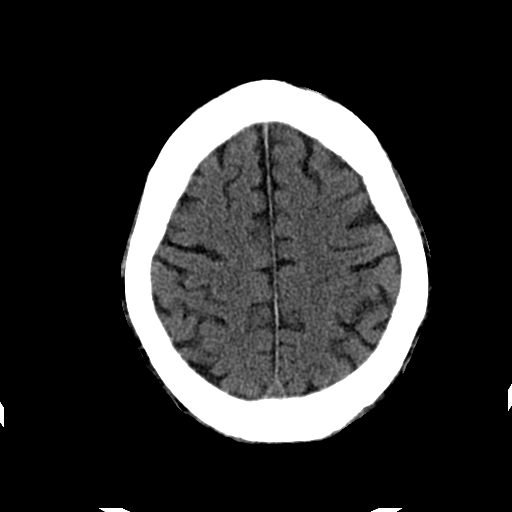
[im 21/29  bone]
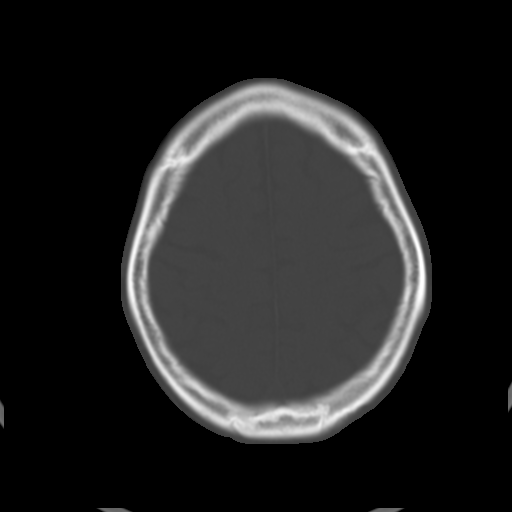
[im 23/29  brain]
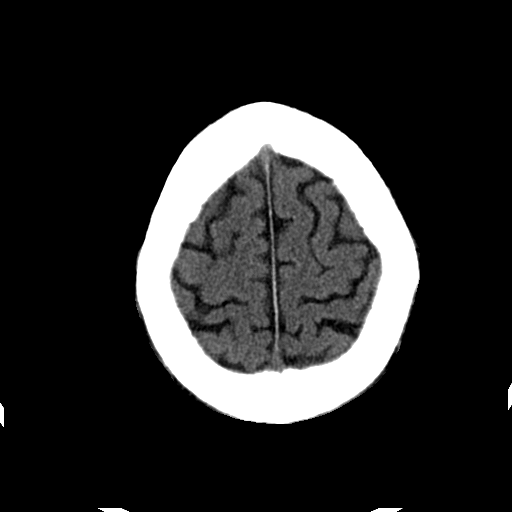
[im 25/29  brain]
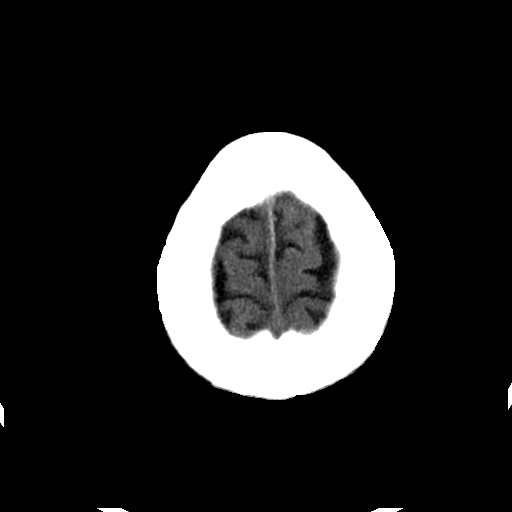
[im 27/29  brain]
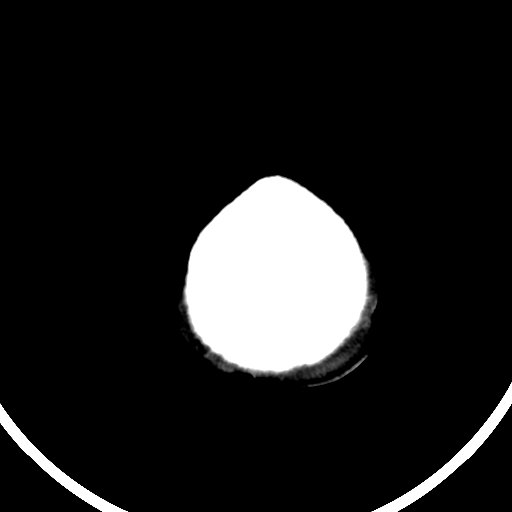

[Series 203: coronal st, idose (1) · coronal · 0.40mm/px · 3 of 73 slices shown]
[im 25/73  brain]
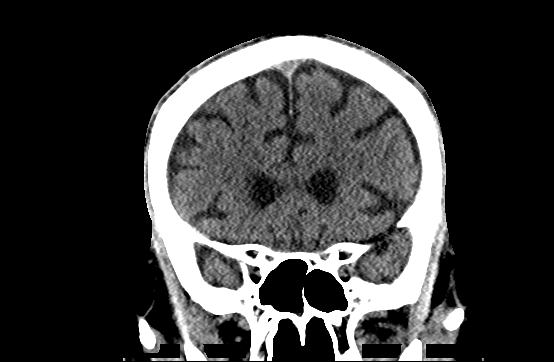
[im 33/73  brain]
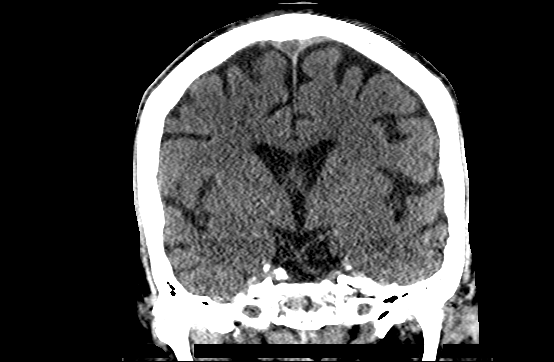
[im 41/73  brain]
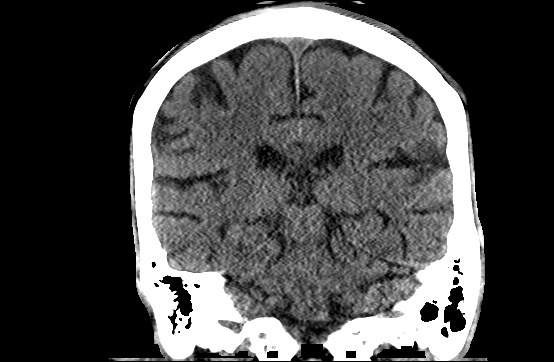

[Series 204: sagittal st, idose (1) · sagittal · 0.40mm/px · 3 of 73 slices shown]
[im 25/73  brain]
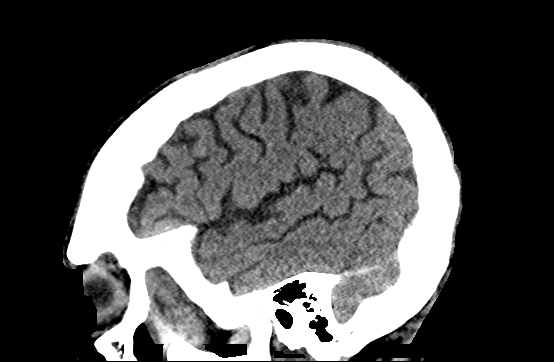
[im 37/73  brain]
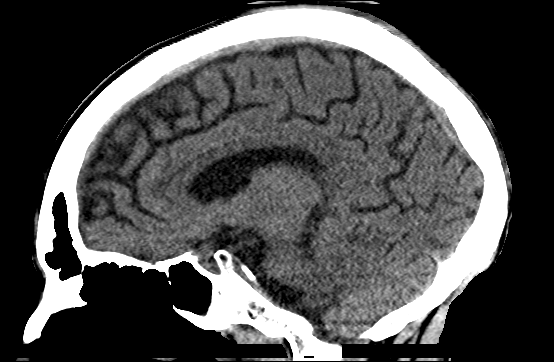
[im 49/73  brain]
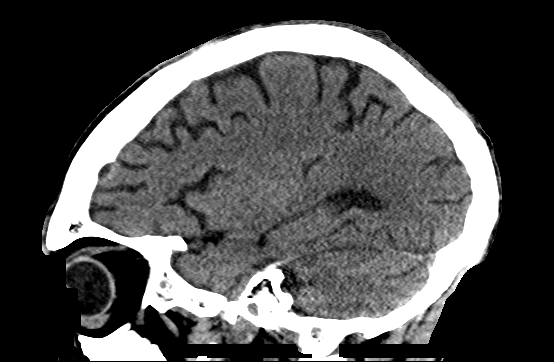

[18 of 47 positions shown; findings below may reference images not displayed]

FINDINGS: Brain: Stable cerebral volume. No midline shift, ventriculomegaly,
mass effect, evidence of mass lesion, intracranial hemorrhage or
evidence of cortically based acute infarction. Gray-white matter
differentiation is within normal limits throughout the brain. No
cortical encephalomalacia identified.

Vascular: No suspicious intracranial vascular hyperdensity.

Skull: No acute osseous abnormality identified.

Sinuses/Orbits: Visualized paranasal sinuses and mastoids are stable
and well pneumatized. There is mild mucosal thickening in the
sphenoid sinuses which is unchanged since [REDACTED]. Tympanic
cavities and mastoids remain clear.

Other: Negative orbit and scalp soft tissues.
IMPRESSION: Stable and negative noncontrast CT appearance of the brain.

## 2018-11-20 IMAGING — CT CT ABD-PELV W/ CM
2 of 5 series · 9 of 46 positions shown, 10 images · IV contrast (Iodine)
Comparison: CT Abdomen and Pelvis 02/24/2016 and earlier.

CLINICAL DATA: 30-year-old Quadriplegic male with fever of unknown
origin. Seizure like activity since yesterday. Initial encounter.

EXAM:
CT ABDOMEN AND PELVIS WITH CONTRAST
TECHNIQUE: Multidetector CT imaging of the abdomen and pelvis was performed
using the standard protocol following bolus administration of
intravenous contrast.
CONTRAST:  80mL UM1ZKJ-KGG IOPAMIDOL (UM1ZKJ-KGG) INJECTION 61%

[Series 201: routine, idose (2) · axial · 0.81mm/px · z∈[+67,+397]mm · 6 of 88 slices shown, 7 images]
[im 11/88  soft-tissue]
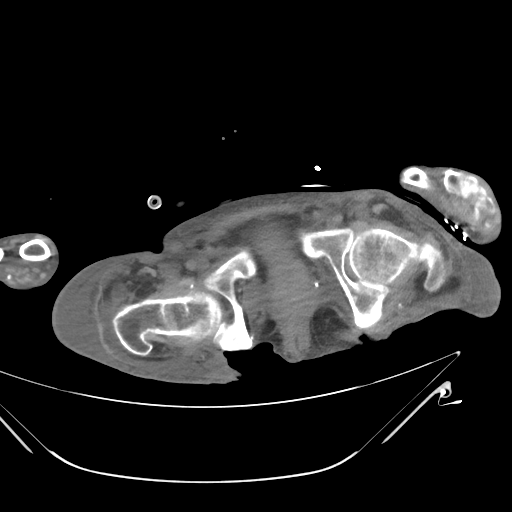
[im 11/88  bone]
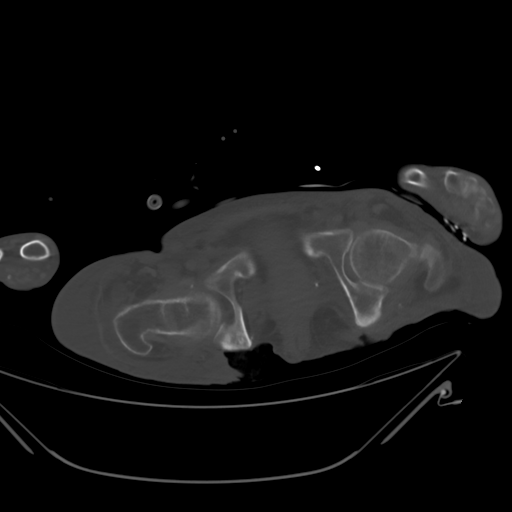
[im 26/88  soft-tissue]
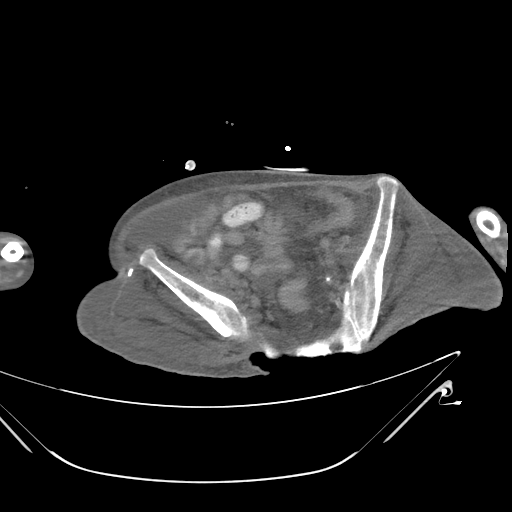
[im 36/88  soft-tissue]
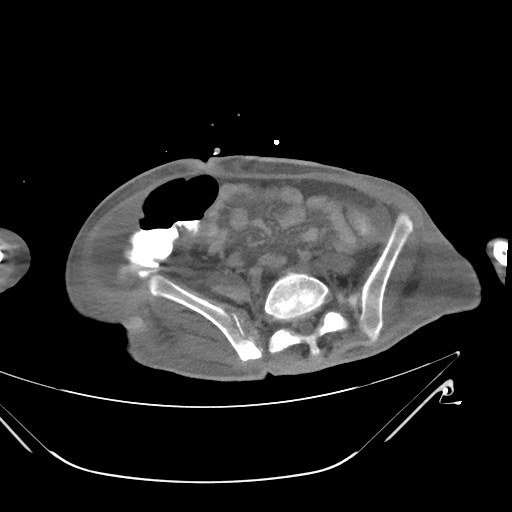
[im 52/88  soft-tissue]
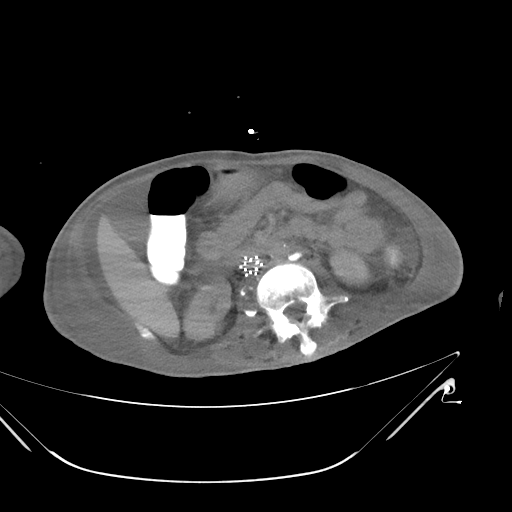
[im 67/88  soft-tissue]
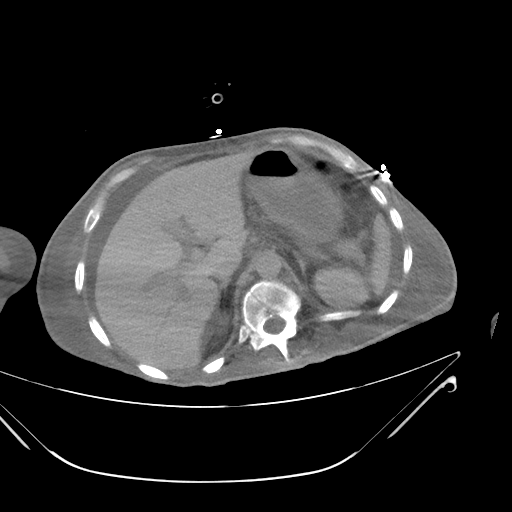
[im 77/88  soft-tissue]
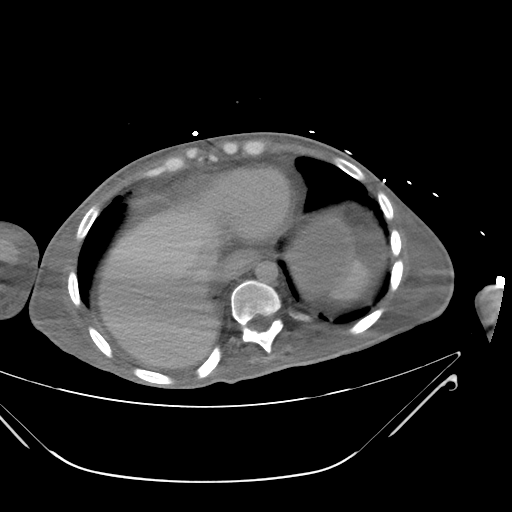

[Series 203: coronals, idose (2) · coronal · 0.45mm/px · 3 of 91 slices shown]
[im 31/91  soft-tissue]
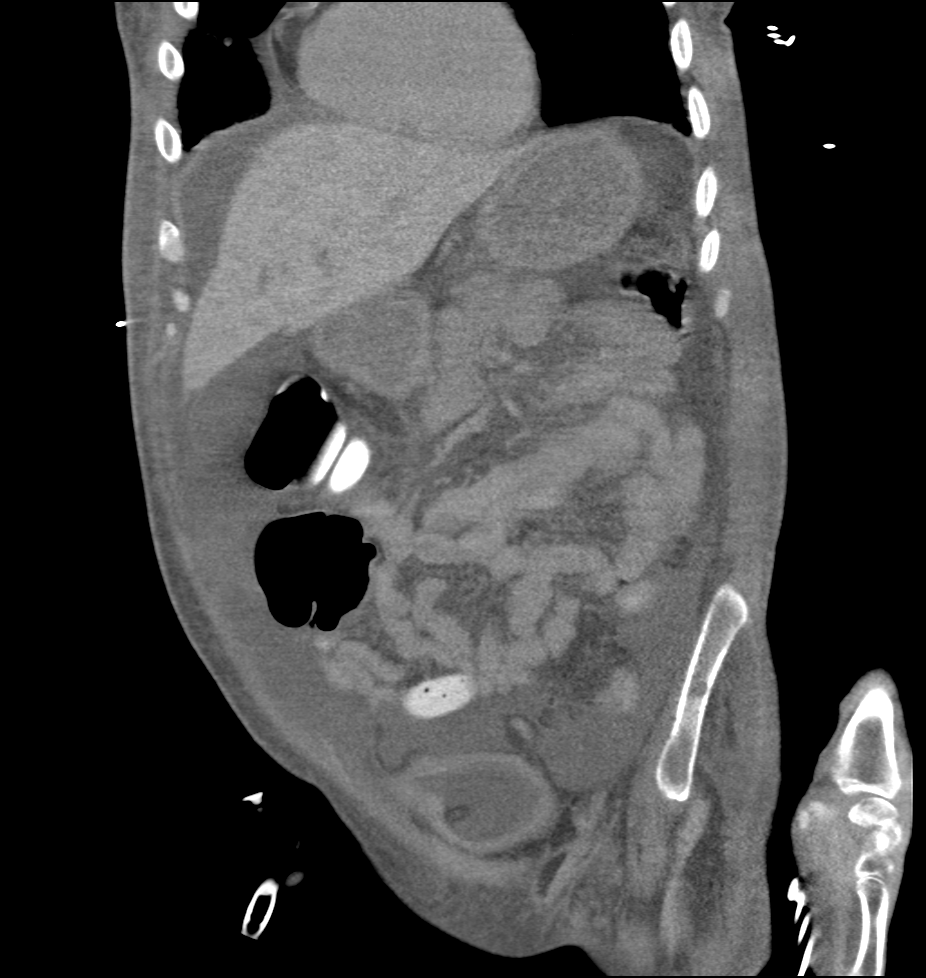
[im 41/91  soft-tissue]
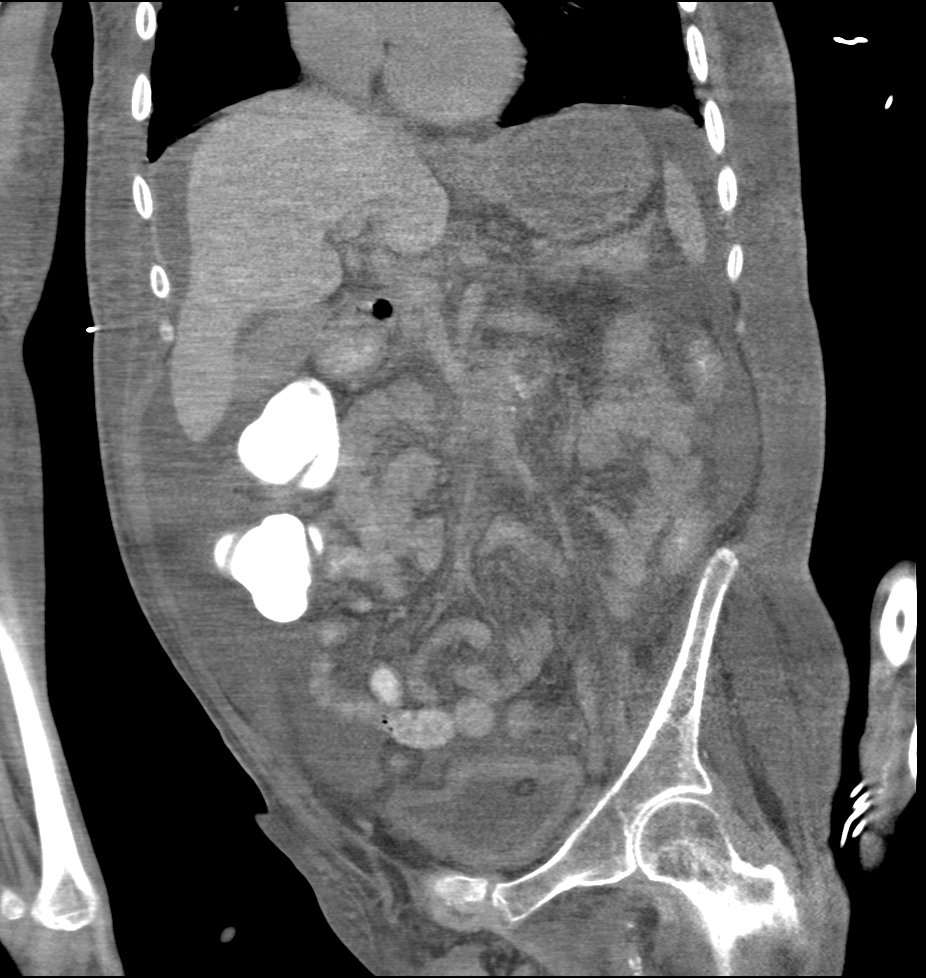
[im 51/91  soft-tissue]
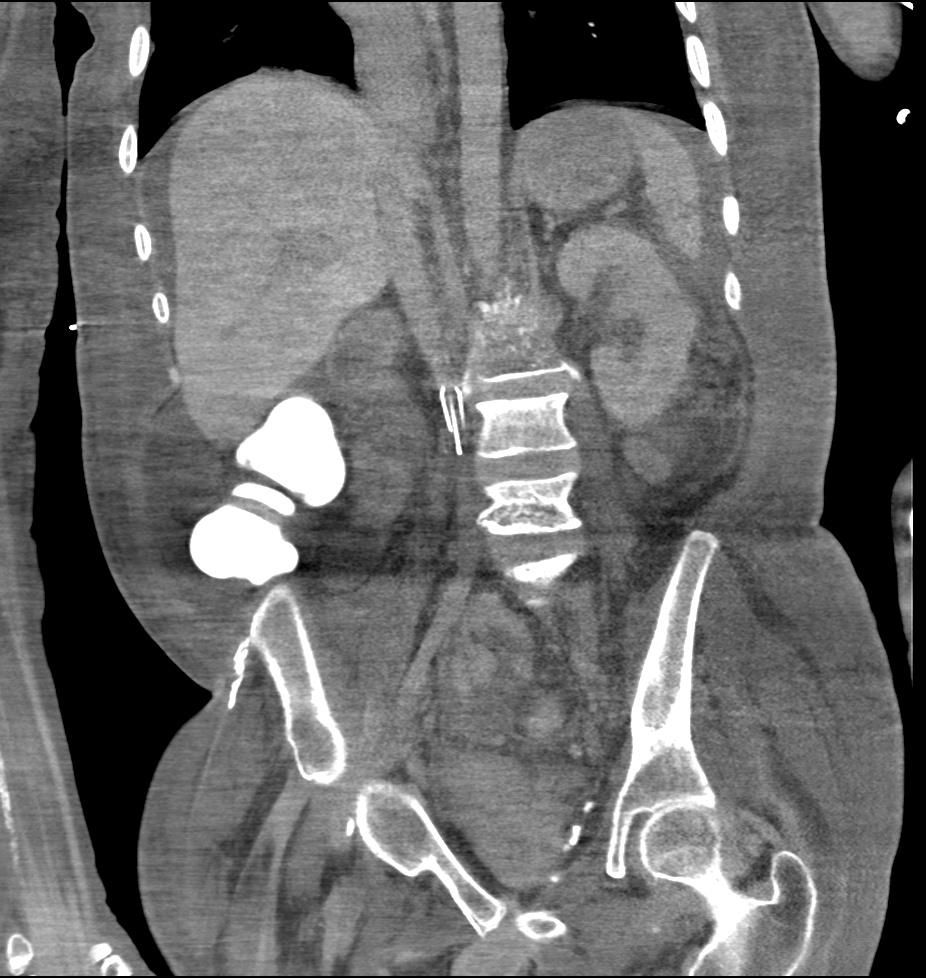

[9 of 46 positions shown; findings below may reference images not displayed]

FINDINGS: Lower chest: Improved ventilation in the left lower lobe since
08/26/2016, but continued consolidation and increased peribronchial
nodularity in the right lower lobe. Small layering right pleural
effusion persists. Interval increased patchy and peribronchial
opacity in the visible right middle lobe (series 205, image 3).

No pericardial effusion.  No left pleural effusion.

Hepatobiliary: Negative liver. Dependent sludge or vicarious
contrast excretion in the gallbladder. The gallbladder is
nondilated. No biliary ductal enlargement is evident.

Pancreas: Negative.

Spleen: Diminutive common negative.

Adrenals/Urinary Tract: Normal adrenal glands. Both kidneys appear
stable and within normal limits.

Chronic suprapubic catheter. Generalized urinary bladder wall
thickening. Small volume gas within the non dependent portion of the
bladder. Incidental pelvic phleboliths.

Stomach/Bowel: Stable rectum, with mild perirectal fat stranding
contiguous with the decubitus ulcers. Decompressed rectosigmoid
colon.

Left lower quadrant double barrel large bowel ostomy. Oral contrast
has reached the ostomy and is within the descending colon. Gas
distended transverse colon. Mildly contrast distended right colon.
Evidence of a normal appendix containing contrast (series 21, image
55). No dilated small bowel. Gastrostomy tube in place with no
adverse features.

Vascular/Lymphatic: Suboptimal intravascular contrast bolus due to
hand injection. Major arterial structures appear patent. IVC filter
remains in place. No portal venous contrast at the time of imaging.

No lymphadenopathy identified.

Reproductive: Negative.

Other: No pelvic free fluid.

Small volume bilateral upper abdominal free fluid. Small to moderate
volume of free fluid in the right mid abdomen about the right colon.
No pneumoperitoneum.

Regressed right paraspinal muscle gas at the L4 level since [REDACTED]
suggesting regressed intramuscular abscess. Continued abnormal gas
in the right SI joint. See skeletal findings below.

Musculoskeletal: Moderate to severe heterogeneity of the T8, L1, L2
and L4 vertebral bodies with areas of bone erosion, nonspecific but
compatible with sequelae of osteomyelitis. Superimposed mild
compression fractures of T10, L3, and L5 could be pathologic
fractures. Sequelae of sacral resection beginning at the distal S2
level. Widespread overlying decubitus ulcers at the sacrum and
pelvis. Sequelae of septic right SI joint. Sequelae of chronic
ischial tuberosity osteomyelitis.

Overall Stable visualized osseous structures since the prior CT.
IMPRESSION: 1. Improved left lower lobe pneumonia since [REDACTED] but continued
Right Lower Lobe Pneumonia with increased superior segment
peribronchial opacity, an new abnormal right middle lobe opacity
suspicious for progression of right lung infection. Stable small
layering right pleural effusion.
2. New small to moderate volume free fluid/ascites in the abdomen is
nonspecific.
3. Otherwise no focal intra-abdominal or intra-pelvic inflammatory
process identified.
4. Advanced sequelae of sacral and pelvic osteomyelitis, including
septic right SI joint. Associated right lower lumbar paraspinal
muscle abscess probably is regressed since [REDACTED]. Sequelae of
multilevel spinal osteomyelitis suspected and stable since [DATE]. Chronic suprapubic catheter with nonspecific bladder wall
thickening. No bowel obstruction. Percutaneous gastrostomy tube with
no adverse features.

## 2018-11-21 IMAGING — CR DG CHEST 1V PORT
1 series · 1 of 1 positions shown · non-contrast
Comparison: 09/07/2016

CLINICAL DATA: Tracheostomy

EXAM:
PORTABLE CHEST 1 VIEW

[AP]
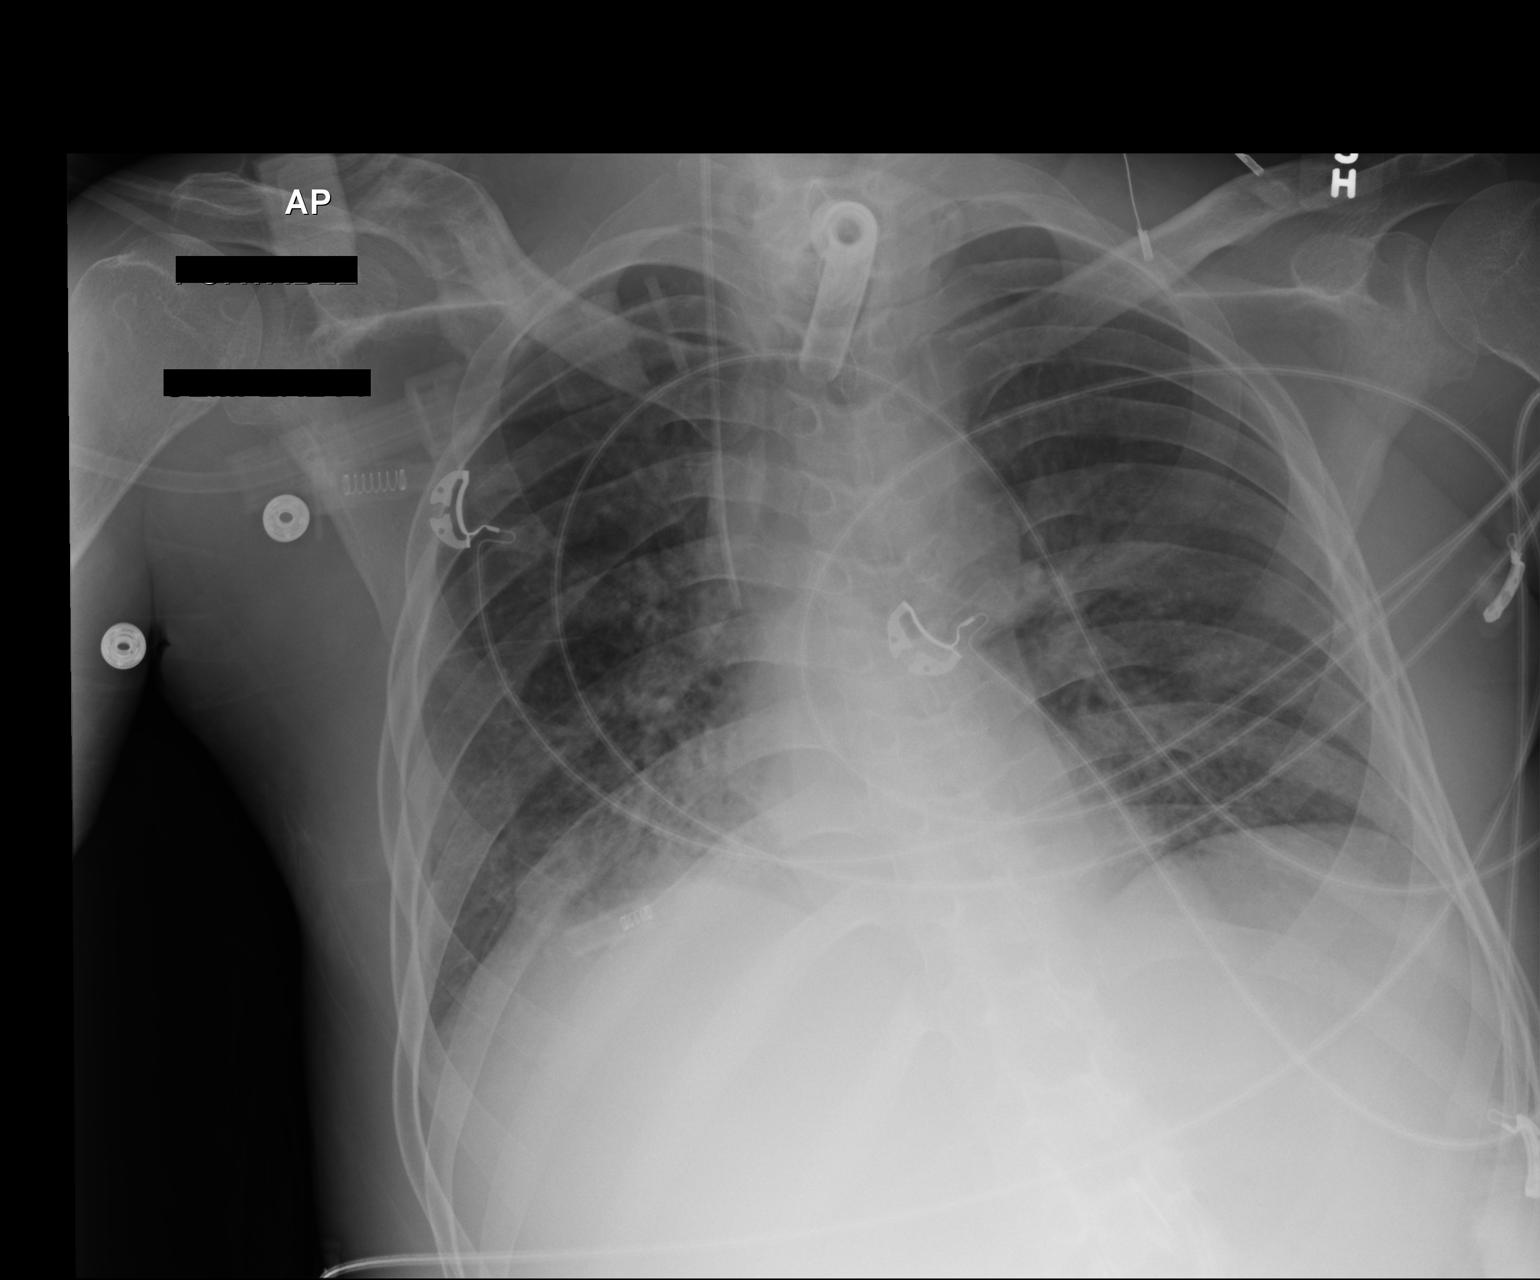

[1 of 1 positions shown; findings below may reference images not displayed]

FINDINGS: Tracheostomy tube is unchanged in position. Central mild vascular
congestion without convincing pulmonary edema. Improvement in
aeration from prior exam. Residual streaky bilateral basilar
atelectasis or infiltrate. Right IJ central line is unchanged in
position.
IMPRESSION: Stable tracheostomy position. Central mild vascular congestion
without convincing pulmonary edema. Improvement in aeration from
prior exam. Residual streaky bilateral basilar atelectasis or
infiltrate.

## 2018-11-23 IMAGING — CR DG CHEST 1V PORT
1 series · 1 of 1 positions shown · non-contrast
Comparison: 09/12/2016

CLINICAL DATA: Respiratory failure

EXAM:
PORTABLE CHEST 1 VIEW

[AP]
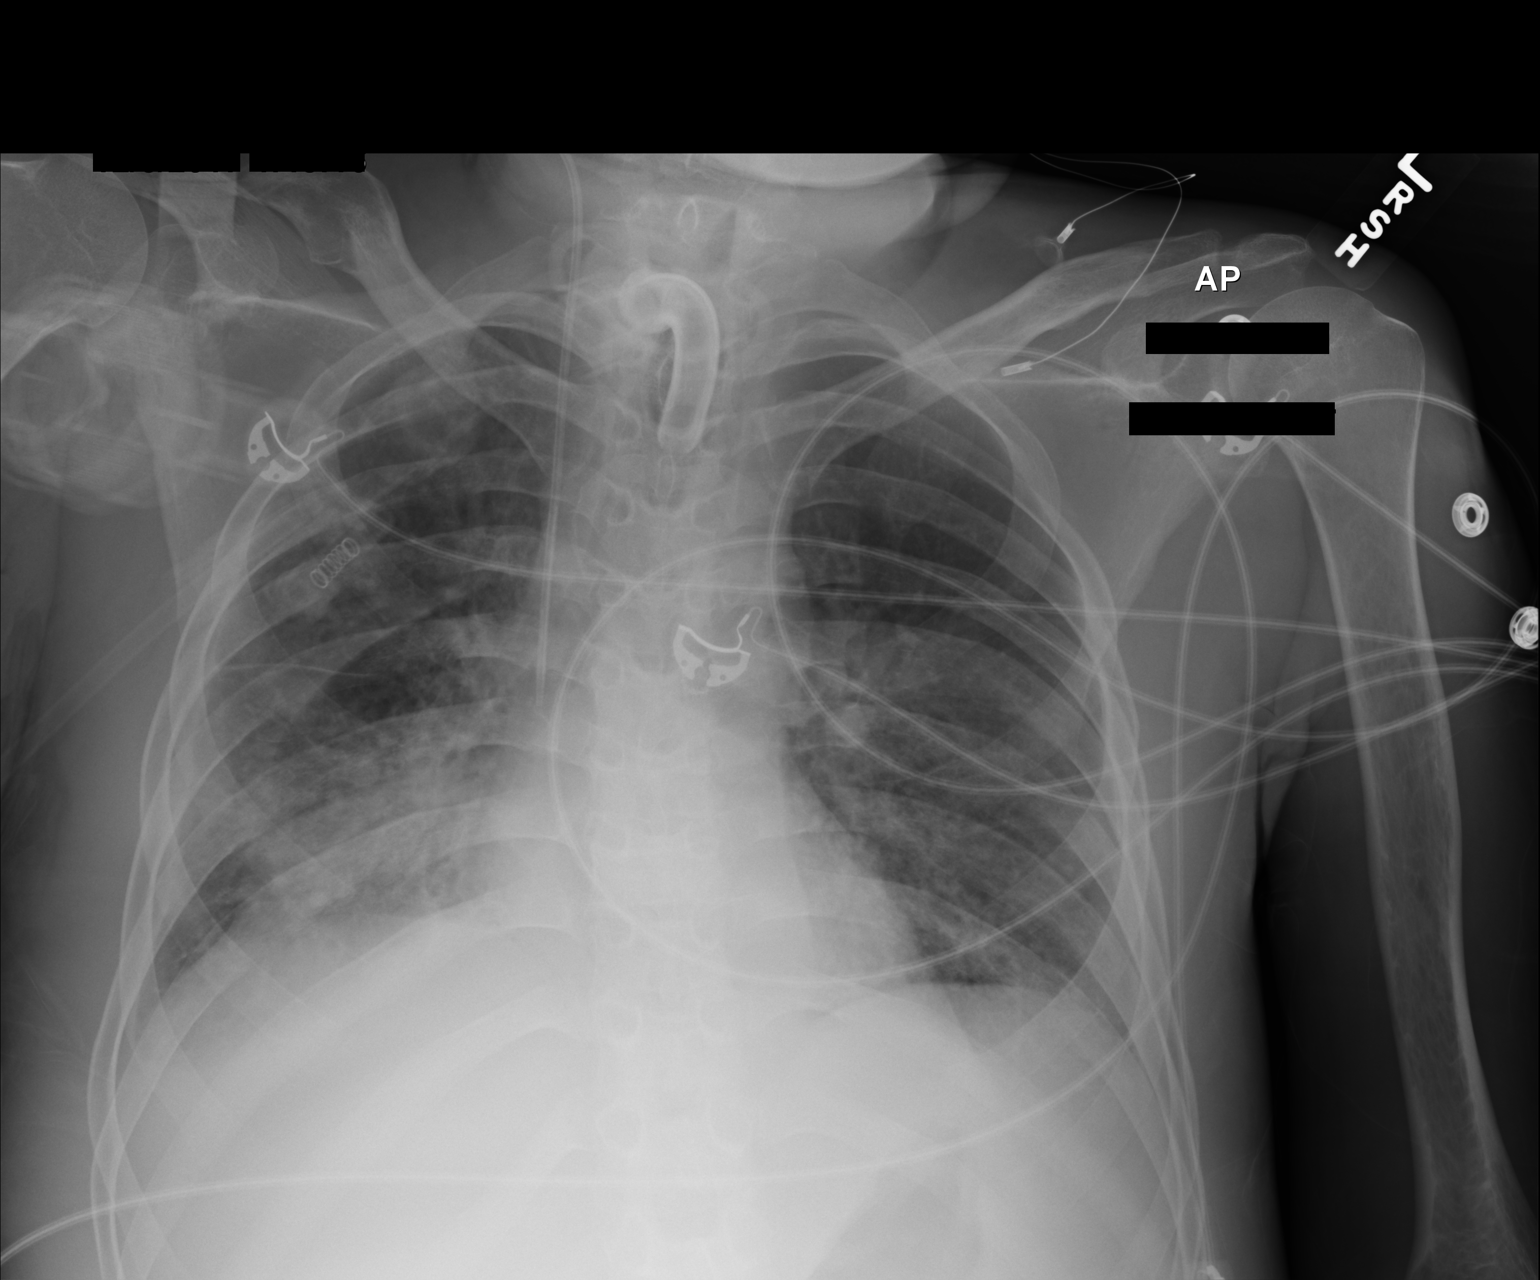

[1 of 1 positions shown; findings below may reference images not displayed]

FINDINGS: Tracheostomy and right central line remain in place, unchanged.
Heart is normal size. Bilateral airspace disease, right greater than
left, slightly worsened since prior study. Findings concerning for
pneumonia. Possible small bilateral effusions. No acute bony
abnormality.
IMPRESSION: Worsening patchy bilateral airspace disease, right greater than left
concerning for pneumonia. Question small effusions.

## 2018-11-24 IMAGING — CR DG CHEST 1V PORT
1 series · 1 of 1 positions shown · non-contrast
Comparison: Chest radiograph 09/14/2016.

CLINICAL DATA: Patient with history of pneumonia. Respiratory
failure.

EXAM:
PORTABLE CHEST 1 VIEW

[AP]
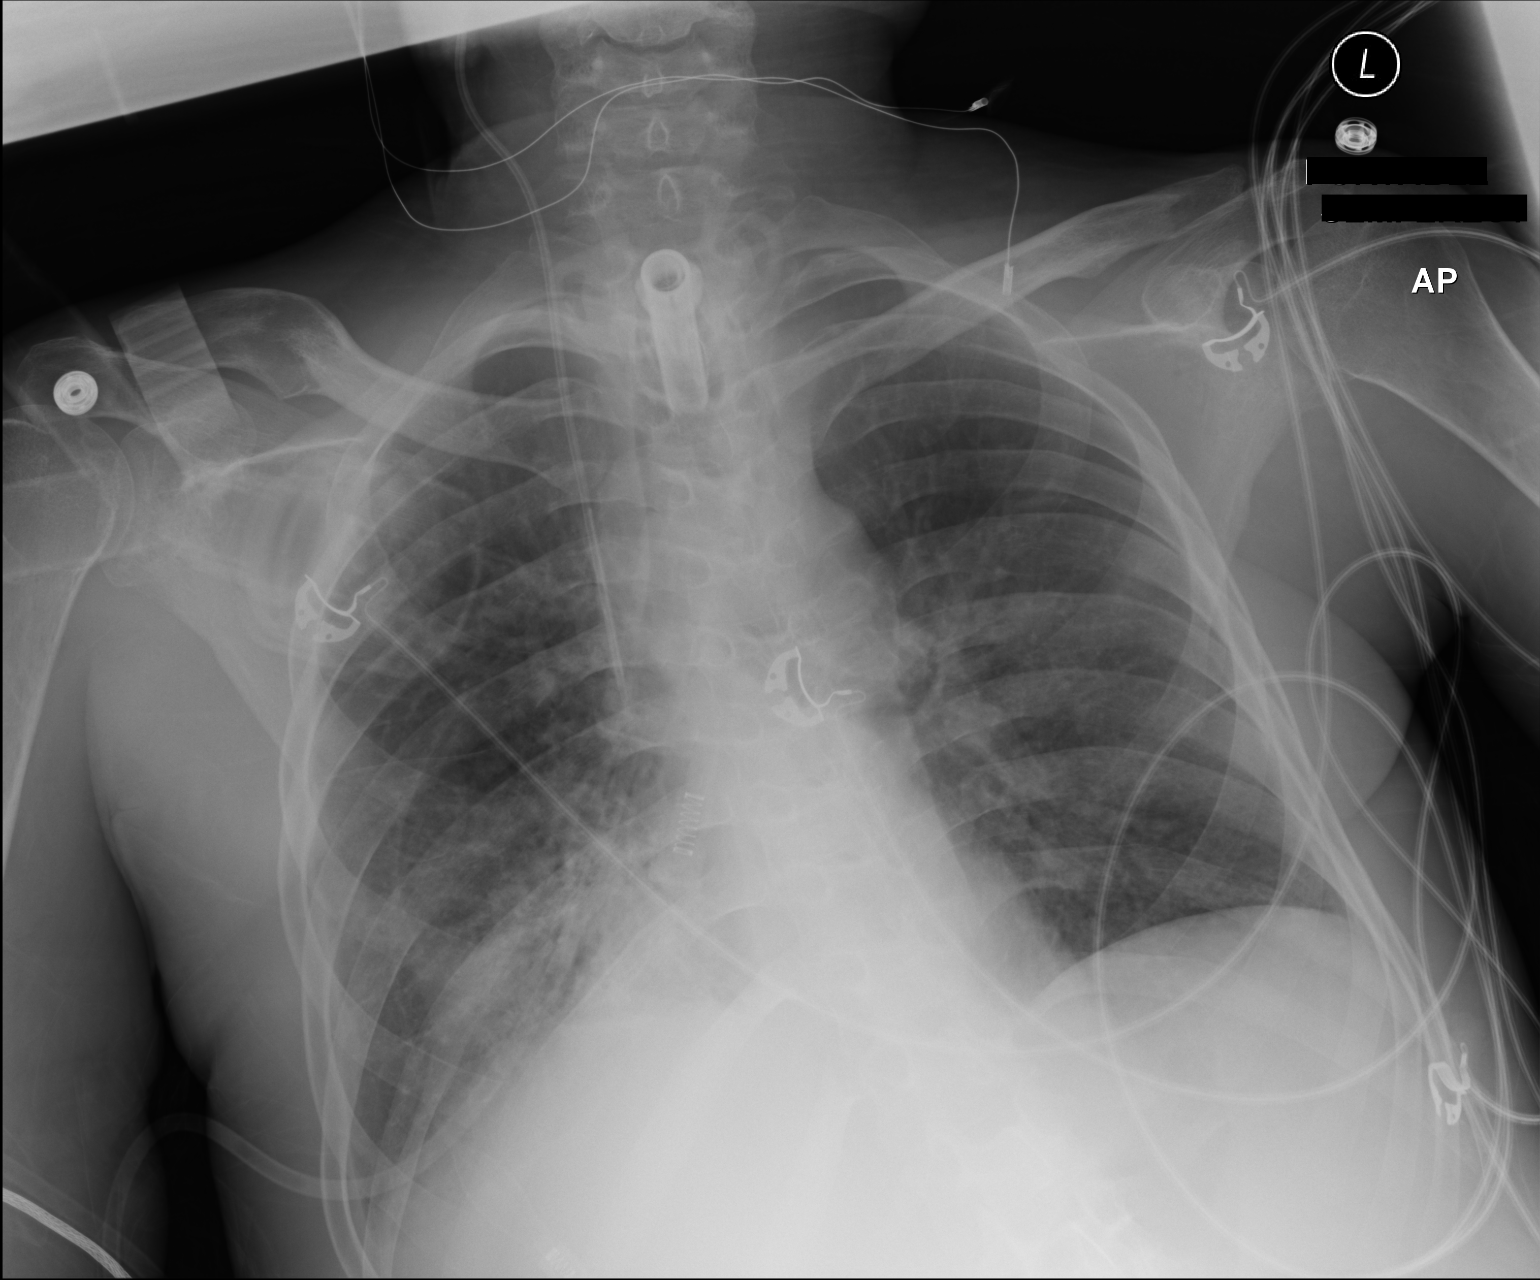

[1 of 1 positions shown; findings below may reference images not displayed]

FINDINGS: Right IJ central venous catheter tip projects over the superior vena
cava. Tracheostomy tube stable in position. Stable cardiac and
mediastinal contours. Persistent consolidative opacities right mid
lower lung. Minimal heterogeneous opacities left lung base. No
pleural effusion or pneumothorax.
IMPRESSION: Stable support apparatus.

Persistent consolidation right lower lung. Unchanged heterogeneous
opacities left lung base.

## 2018-11-24 IMAGING — MR MR HEAD WO/W CM
10 of 13 series · 34 of 48 positions shown · IV contrast (Yes   MULTIHANCE)
Comparison: CT head 09/11/2016

CLINICAL DATA: Seizure.  Diabetes.

EXAM:
MRI HEAD WITHOUT AND WITH CONTRAST
TECHNIQUE: Multiplanar, multiecho pulse sequences of the brain and surrounding
structures were obtained without and with intravenous contrast.
CONTRAST:  10mL MULTIHANCE GADOBENATE DIMEGLUMINE 529 MG/ML IV SOLN

[Series 4: DWI · axial · 3.0mm · 1.09mm/px · z∈[+54,+192]mm · 9 of 94 slices shown (1 of 4)]
[im 1/94]
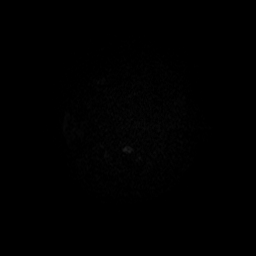
[im 12/94]
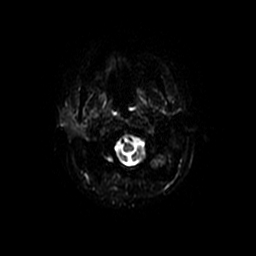
[im 24/94]
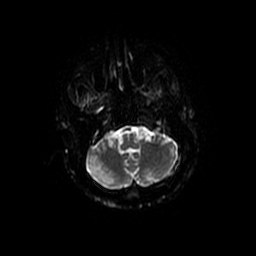
[im 35/94]
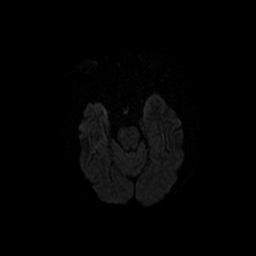
[im 47/94]
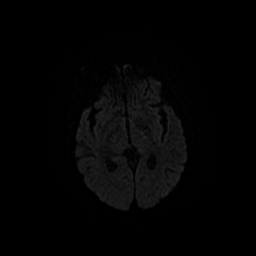
[im 59/94]
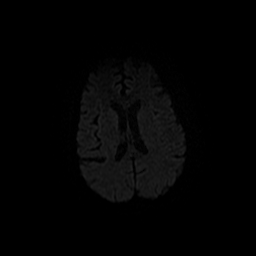
[im 70/94]
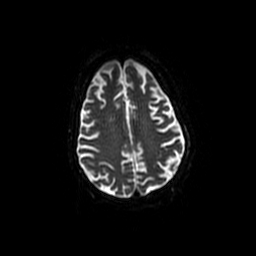
[im 82/94]
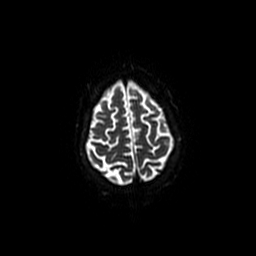
[im 94/94]
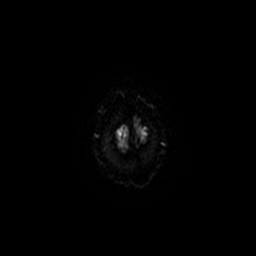

[Series 5: DWI · coronal · 5.0mm · 1.09mm/px · 6 of 72 slices shown (2 of 4)]
[im 1/72]
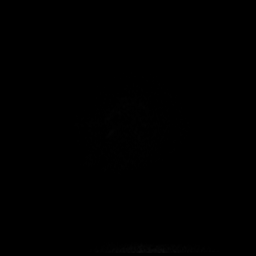
[im 15/72]
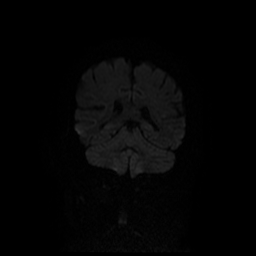
[im 29/72]
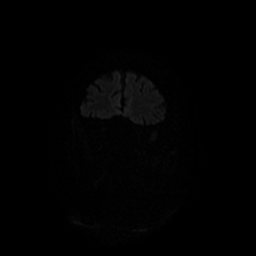
[im 43/72]
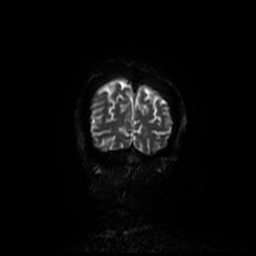
[im 57/72]
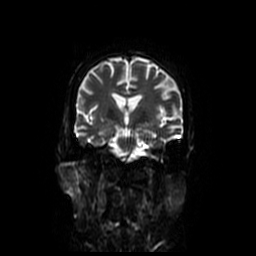
[im 72/72]
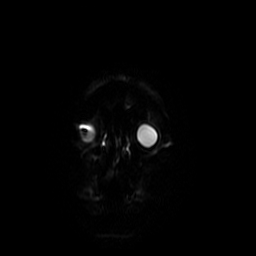

[Series 6: T1 · sagittal · 5.0mm · 0.47mm/px · 2 of 26 slices shown]
[im 1/26]
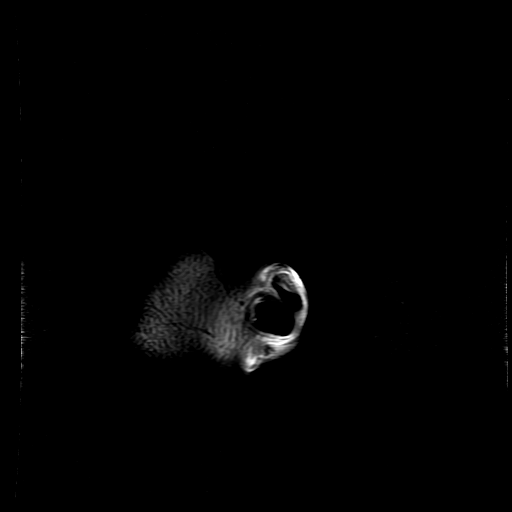
[im 26/26]
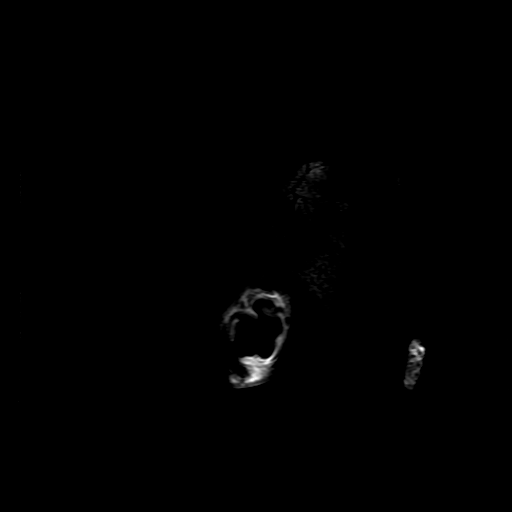

[Series 7: T2 · coronal · 3.0mm · 0.35mm/px · 2 of 21 slices shown (1 of 2)]
[im 1/21]
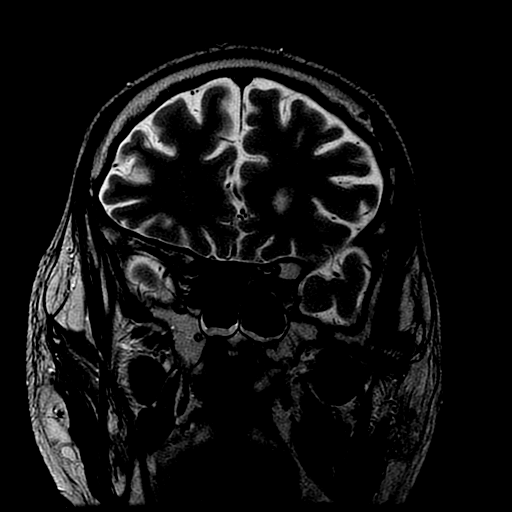
[im 21/21]
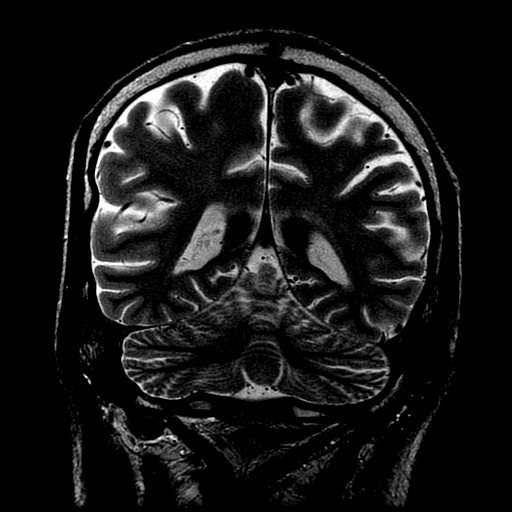

[Series 8: T2 · axial · 5.0mm · 0.43mm/px · z∈[+54,+192]mm · 2 of 24 slices shown (2 of 2)]
[im 1/24]
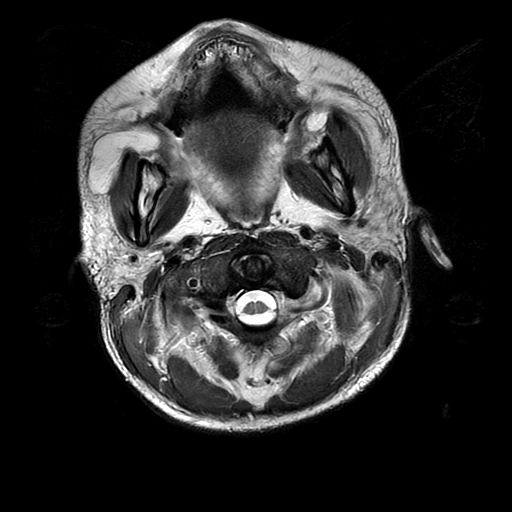
[im 24/24]
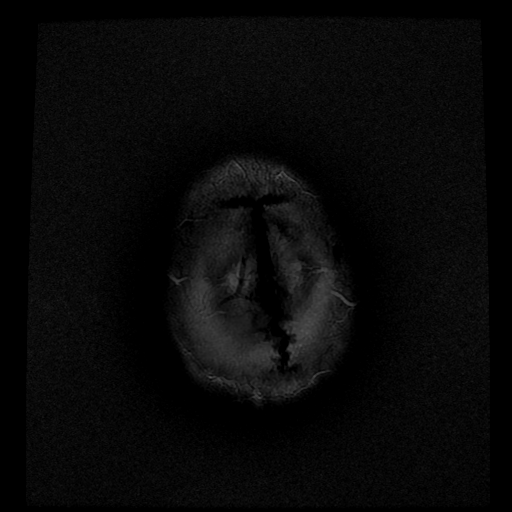

[Series 9: FLAIR · axial · 5.0mm · 0.43mm/px · z∈[+54,+192]mm · 2 of 24 slices shown]
[im 1/24]
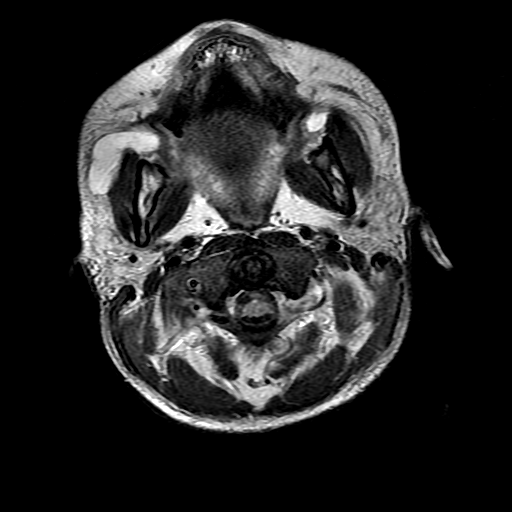
[im 24/24]
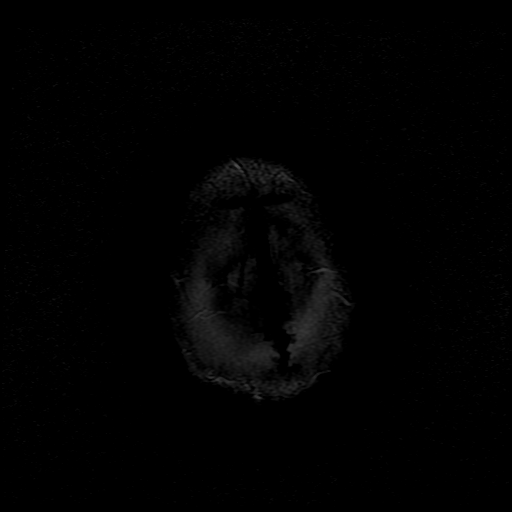

[Series 12: T2 post-contrast · coronal · 5.0mm · 0.39mm/px · 2 of 28 slices shown]
[im 1/28]
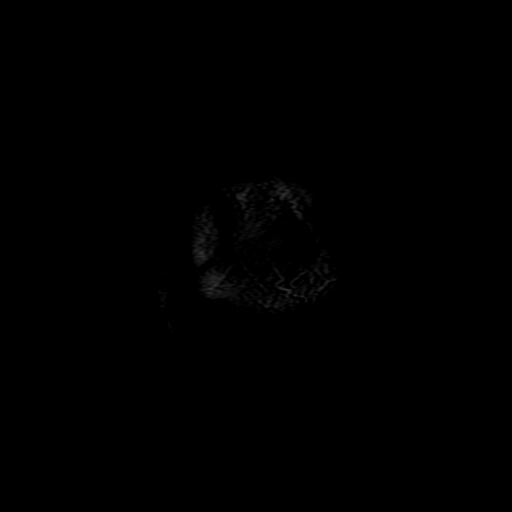
[im 28/28]
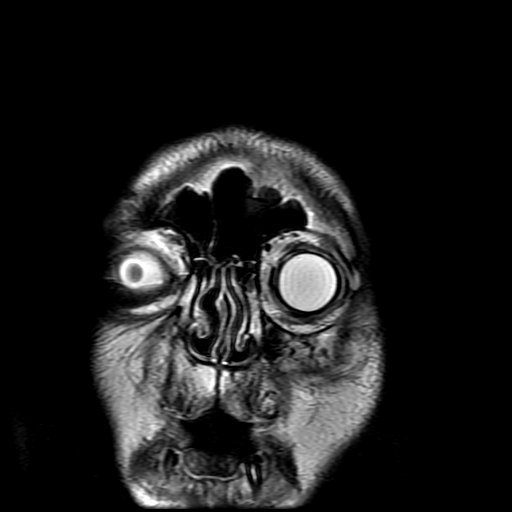

[Series 14: T1 post-contrast · coronal · 5.0mm · 0.39mm/px · 2 of 28 slices shown]
[im 1/28]
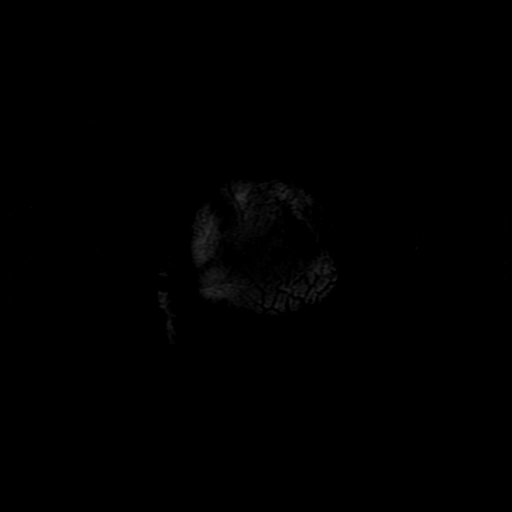
[im 28/28]
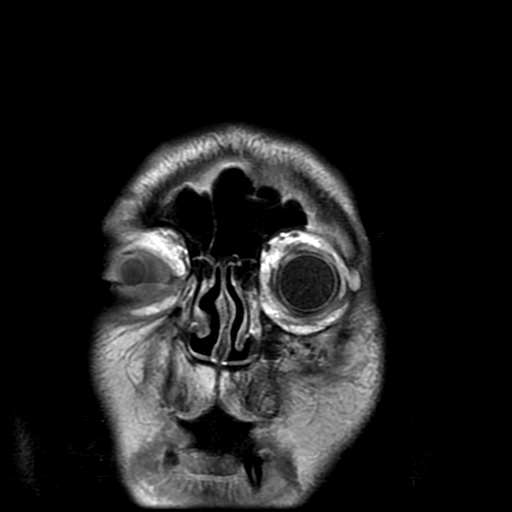

[Series 400: DWI · axial · 3.0mm · 1.09mm/px · z∈[+54,+192]mm · 4 of 47 slices shown (3 of 4)]
[im 1/47]
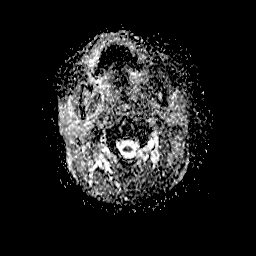
[im 16/47]
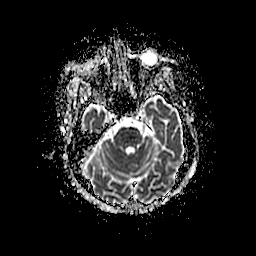
[im 31/47]
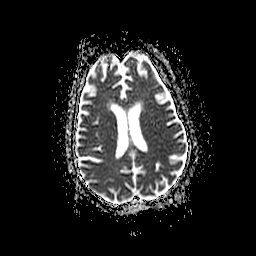
[im 47/47]
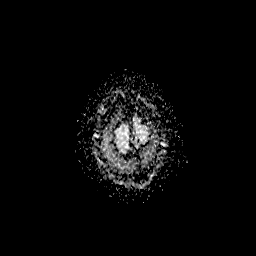

[Series 500: DWI · coronal · 5.0mm · 1.09mm/px · 3 of 36 slices shown (4 of 4)]
[im 1/36]
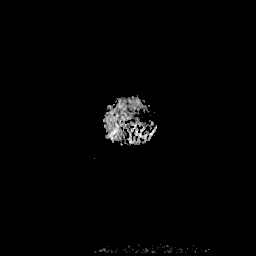
[im 18/36]
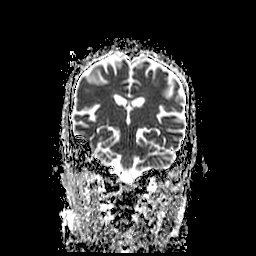
[im 36/36]
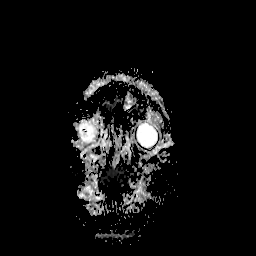

[34 of 48 positions shown; findings below may reference images not displayed]

FINDINGS: Brain: Small focus of restricted diffusion in the left paracentral
mid pons compatible with acute infarct. This measures 3 mm in
diameter. No other areas of acute infarct.

Mild atrophy for age. Small chronic infarcts in the right
cerebellum. Small white matter hyperintensities likely due to mild
chronic microvascular ischemia.

Negative for hemorrhage or mass. Normal enhancement following
contrast infusion.

Vascular: Normal arterial flow voids.  Normal venous enhancement.

Skull and upper cervical spine: Negative

Sinuses/Orbits: Mucosal edema in the paranasal sinuses most notably
the sphenoid sinus. Normal orbit.

Other: None
IMPRESSION: 3 mm acute infarct left paracentral pons. Small chronic infarct
right cerebellum. Mild chronic microvascular ischemic change in the
white matter.

## 2018-11-28 IMAGING — CR DG CHEST 1V PORT
1 series · 1 of 1 positions shown · non-contrast
Comparison: 09/18/2016.

CLINICAL DATA: Tracheostomy.

EXAM:
PORTABLE CHEST 1 VIEW

[AP]
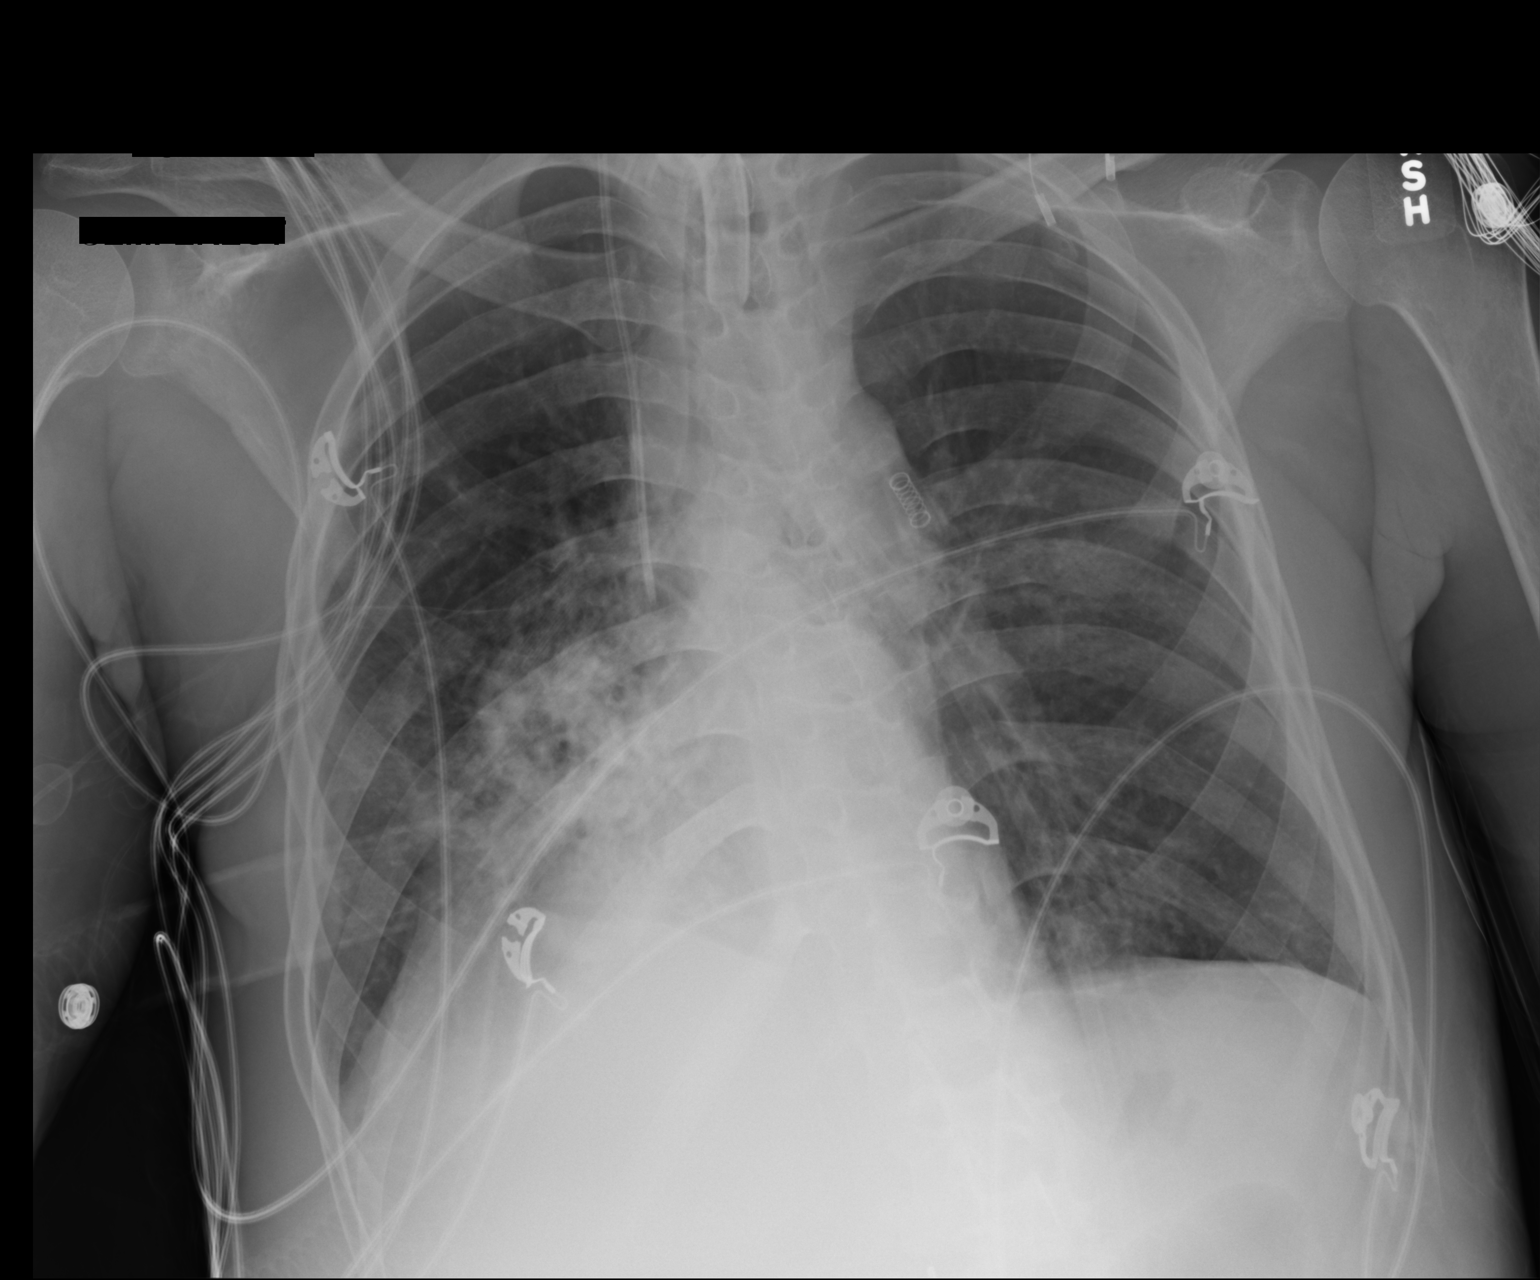

[1 of 1 positions shown; findings below may reference images not displayed]

FINDINGS: Tracheostomy tube and right IJ line stable position. Heart size
normal. Persistent right lower lobe infiltrate with atelectasis in
the right lung base. Mild infiltrate left mid and lower lung. Mild
right pleural effusion. No pneumothorax.
IMPRESSION: 1.  Lines and tubes in stable position.

2. Persistent right lower lobe infiltrate and atelectasis with small
right pleural effusion. Mild infiltrate left mid and lower lung.

## 2018-11-29 IMAGING — CR DG CHEST 1V PORT
1 series · 1 of 1 positions shown · non-contrast
Comparison: Radiograph September 19, 2016.

CLINICAL DATA: Respiratory failure.

EXAM:
PORTABLE CHEST 1 VIEW

[AP]
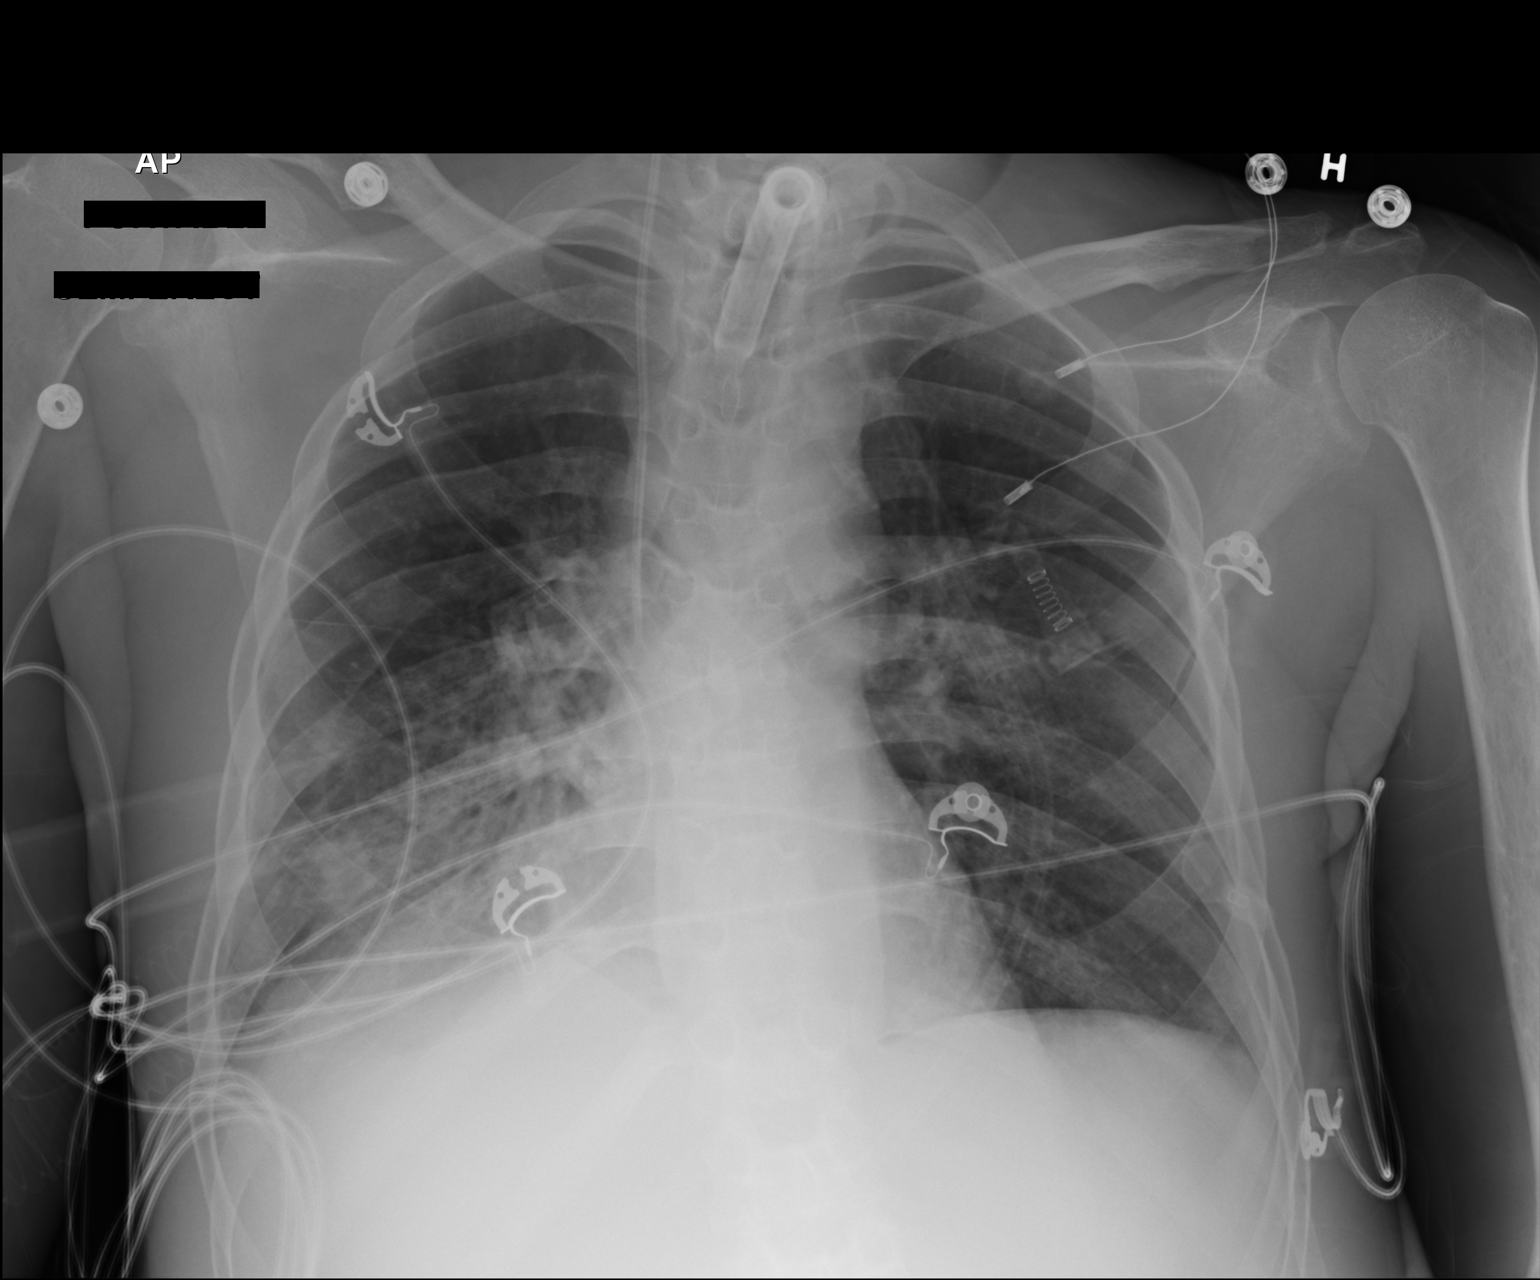

[1 of 1 positions shown; findings below may reference images not displayed]

FINDINGS: Stable cardiomediastinal silhouette. Tracheostomy tube and right
internal jugular catheter are unchanged in position. No pneumothorax
is noted. Stable minimal left perihilar and basilar subsegmental
atelectasis is noted. Stable right perihilar and basilar opacity is
noted concerning for pneumonia or edema. Bony thorax is
unremarkable.
IMPRESSION: Stable support apparatus. Stable right right lung opacity as
described above.

## 2018-12-01 IMAGING — CR DG ABD PORTABLE 1V
1 series · 1 of 1 positions shown · non-contrast
Comparison: Abdomen and pelvis CT dated 09/11/2016.

CLINICAL DATA: Leaking PEG tube.

EXAM:
PORTABLE ABDOMEN - 1 VIEW

[AP]
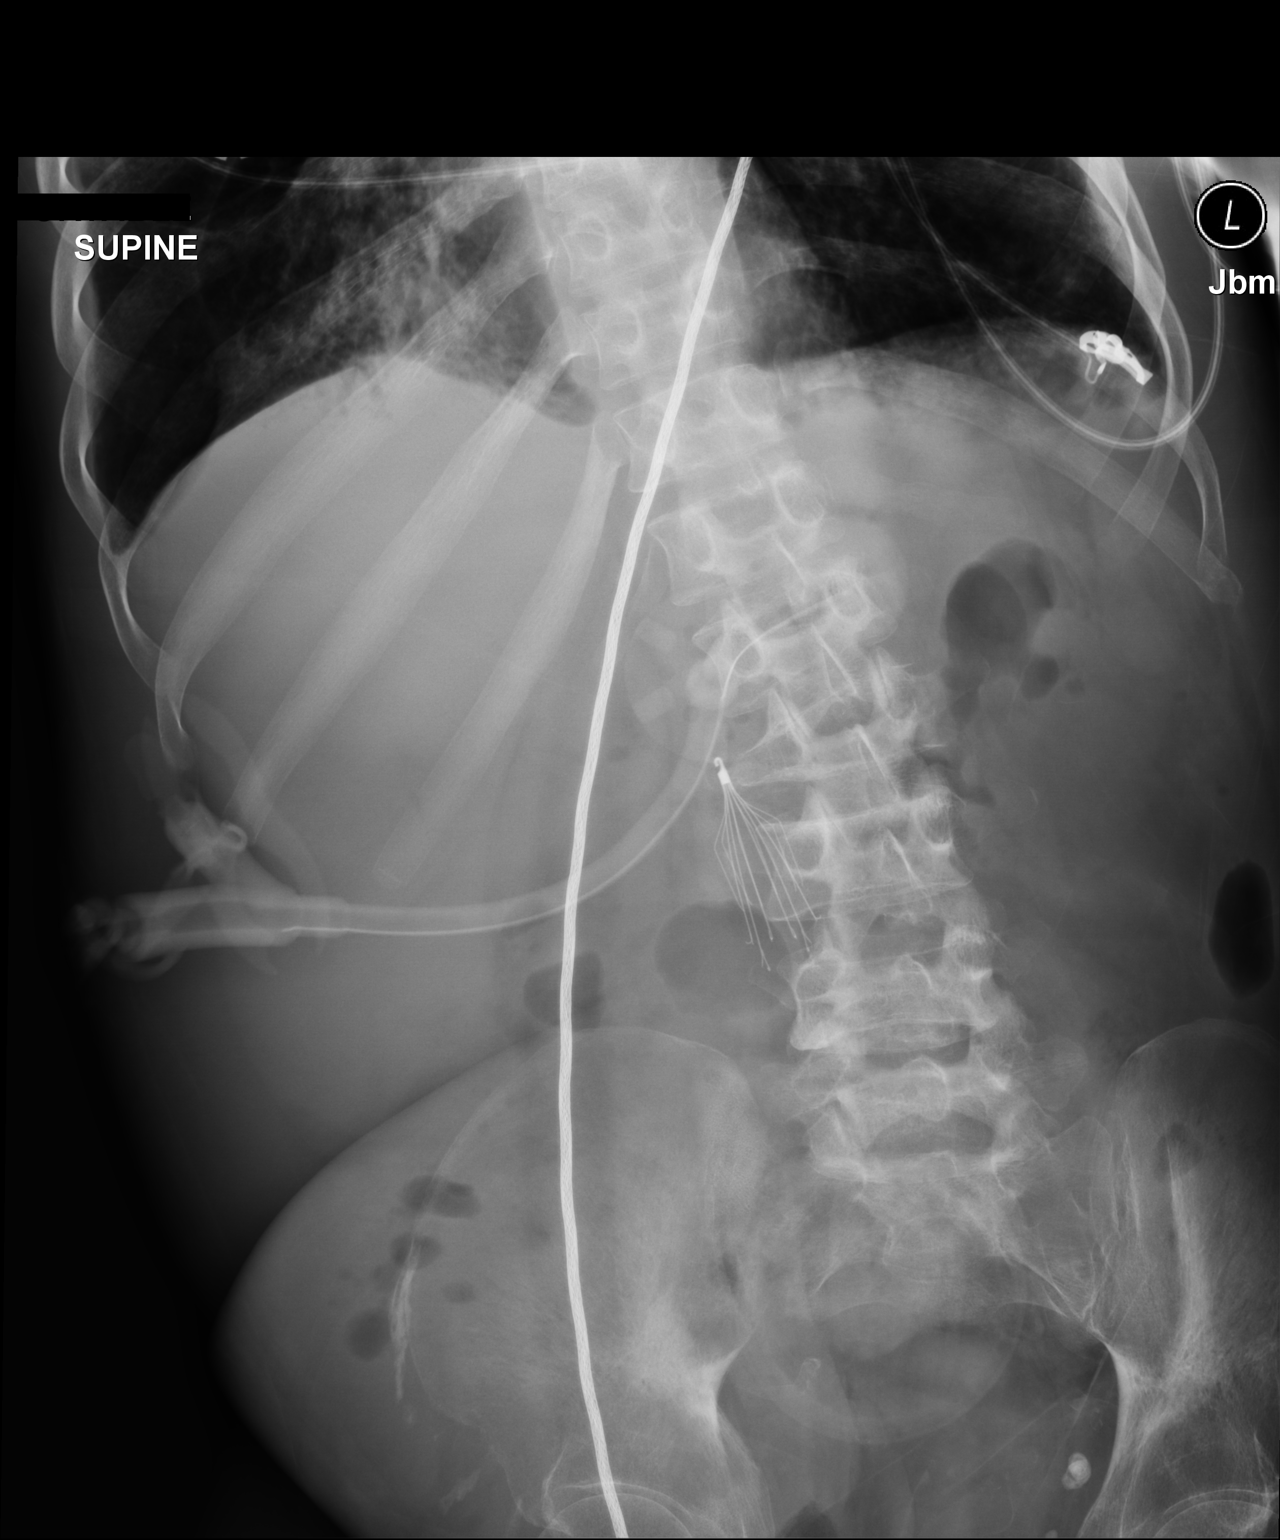

[1 of 1 positions shown; findings below may reference images not displayed]

FINDINGS: Normal bowel gas pattern. Inferior vena cava filter tip at the L2
level. The PEG tube tip remains projected over the medial aspect of
the left upper abdomen. Multiple vertebral compression deformities
are again demonstrated. Right iliac bone sclerosis is again
demonstrated. Airspace opacity at the medial right lung base.
IMPRESSION: 1. The PEG tube appears in satisfactory position in the frontal
projection.
2. Right basilar pneumonia, aspiration pneumonitis or patchy
atelectasis.
3. Chronic bone changes.

## 2018-12-03 IMAGING — CR DG CHEST 1V PORT
1 series · 1 of 1 positions shown · non-contrast
Comparison: Portable chest x-ray September 20, 2016.

CLINICAL DATA: Respiratory failure, tracheostomy patient. History
of asthma, diabetes, chronic osteomyelitis

EXAM:
PORTABLE CHEST 1 VIEW

[AP]
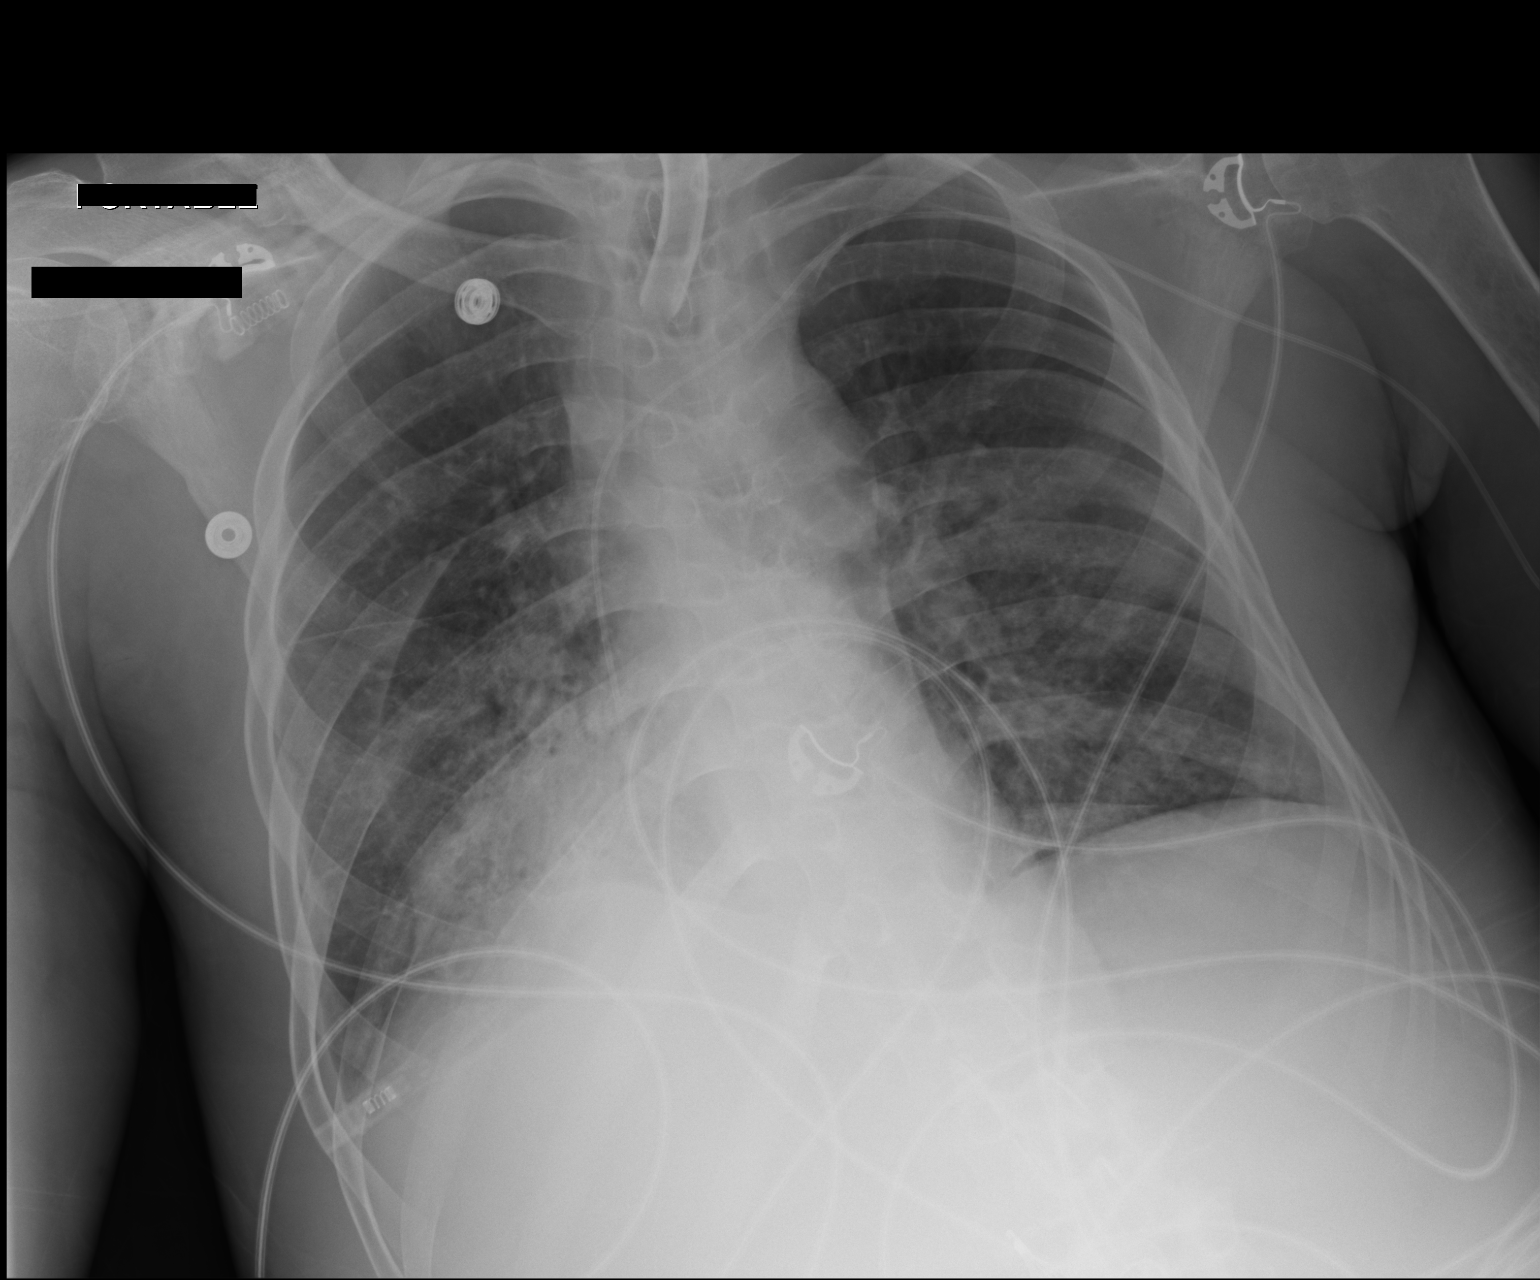

[1 of 1 positions shown; findings below may reference images not displayed]

FINDINGS: The lungs are reasonably well inflated. The interstitial markings in
the left mid and lower lung are slightly more conspicuous today. On
the right there are persistent coarse interstitial and alveolar
opacities in the lower lung with partial obscuration of the
hemidiaphragm. The heart and pulmonary vascularity are normal. The
tracheostomy appliance tip projects at the inferior margin of the
clavicular heads. The PICC line tip projects over the distal third
of the SVC.
IMPRESSION: Persistent right middle and lower lobe infiltrate compatible with
pneumonia. Interval worsening of the left mid and lower lung
interstitium likely reflecting pneumonia as well.

## 2018-12-03 IMAGING — XA IR FLUORO GUIDE NDL PLMT / BX
3 series · 6 of 6 positions shown · non-contrast
Comparison: none

INDICATION: Change in mental status.  Seizures.  High white count.

[Series 1: single · 2 of 2 slices shown (1 of 3)]
[im 1/2]
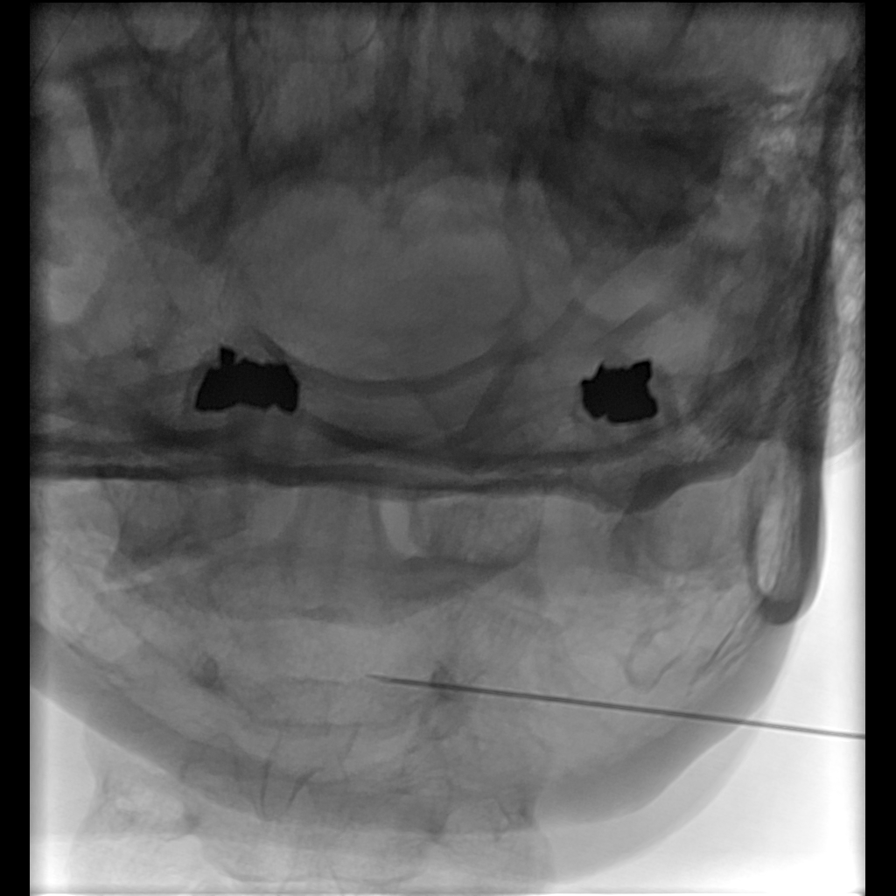
[im 2/2]
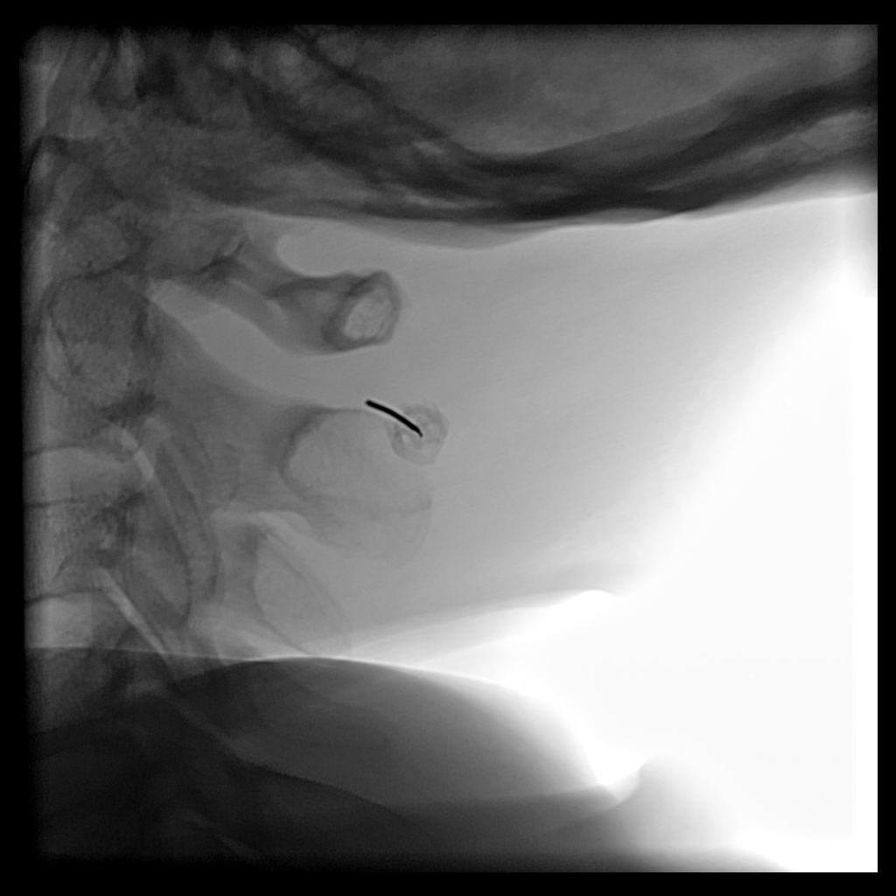

[Series 2: single · 2 of 2 slices shown (2 of 3)]
[im 1/2]
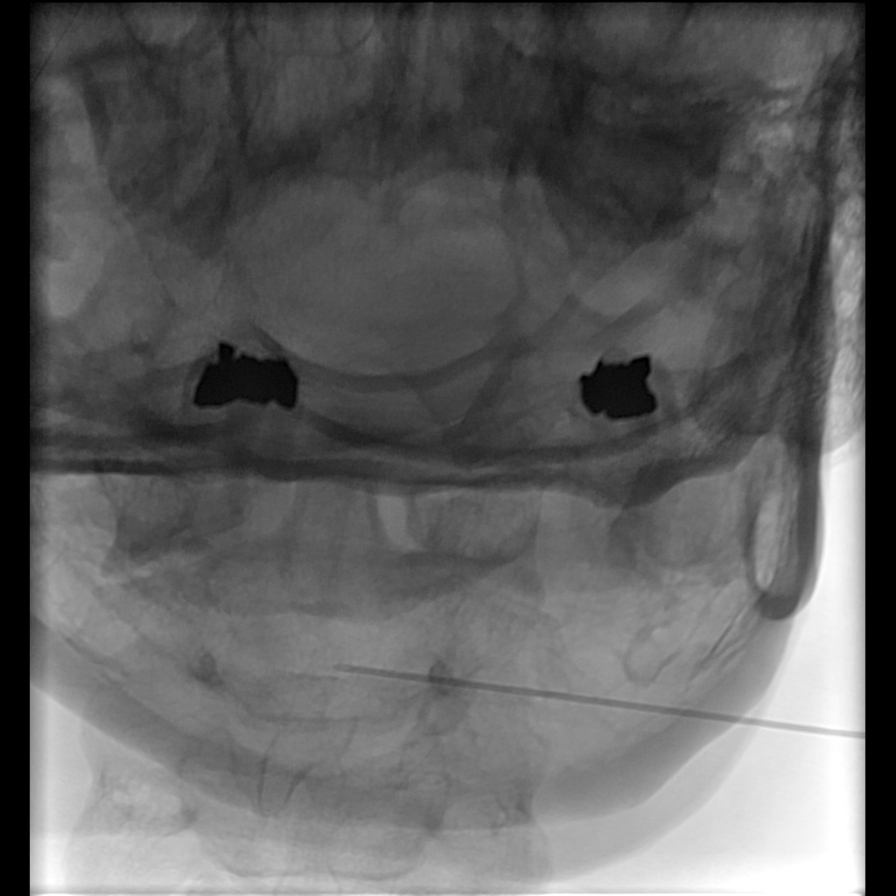
[im 2/2]
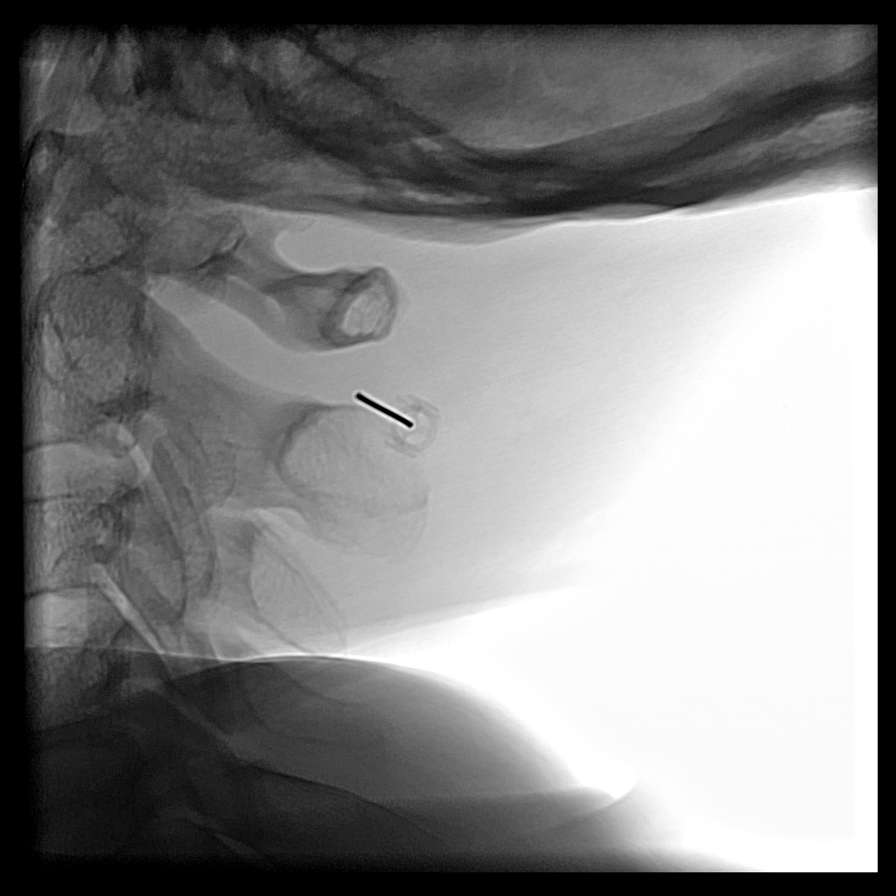

[Series 3: single · 2 of 2 slices shown (3 of 3)]
[im 1/2]
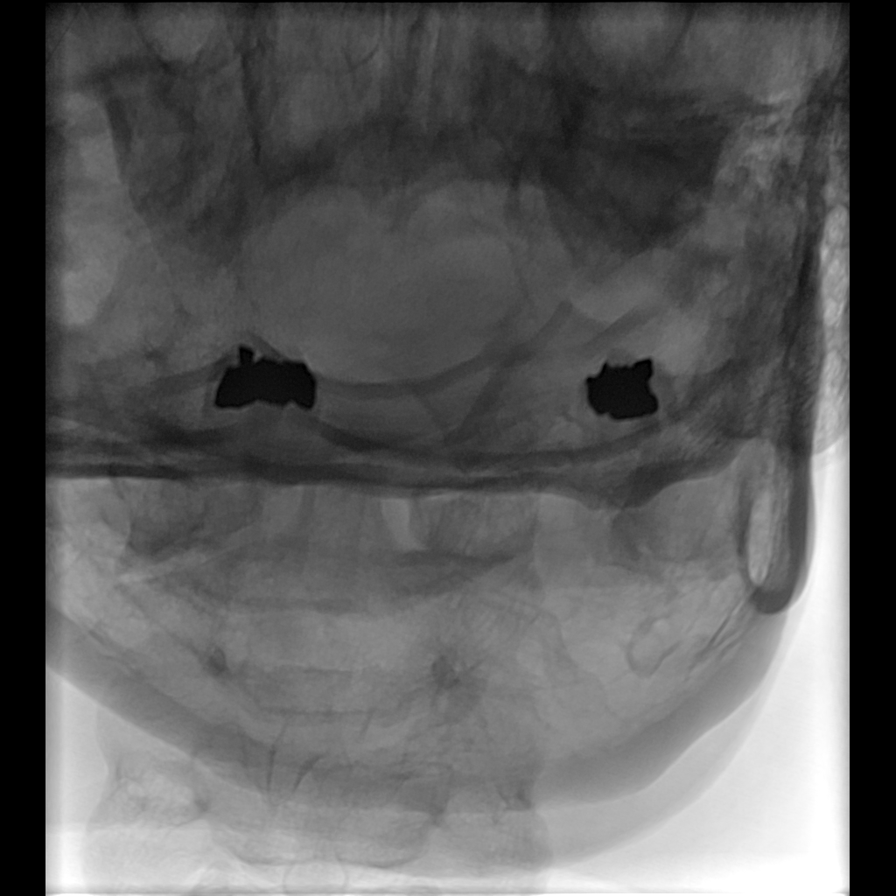
[im 2/2]
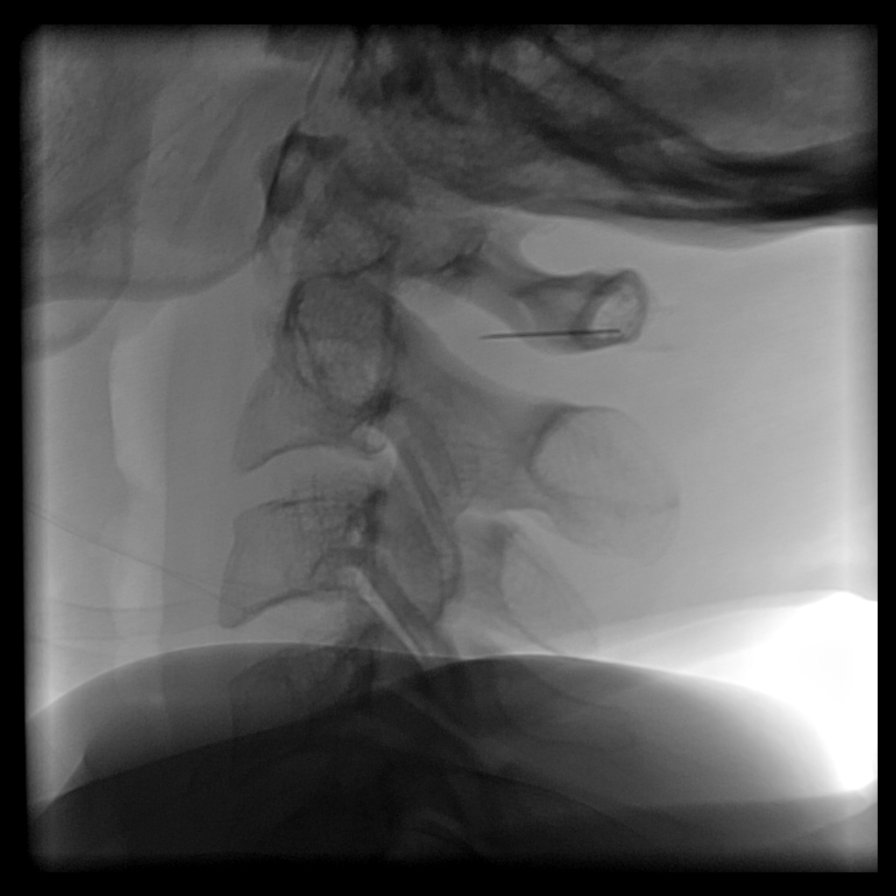

[6 of 6 positions shown; findings below may reference images not displayed]

EXAM:
IR FLUORO GUIDE NEEDLE PLACEMENT /BIOPSY

MEDICATIONS:
As per general anesthesia.

ANESTHESIA/SEDATION:
General anesthesia.

FLUOROSCOPY TIME:  Fluoroscopy Time: 33.9 minutes seconds (7720
mGy).

COMPLICATIONS:
None immediate.

PROCEDURE:
Informed written consent was obtained from the patient after a
thorough discussion of the procedural risks, benefits and
alternatives. All questions were addressed. Maximal Sterile Barrier
Technique was utilized including caps, mask, sterile gowns, sterile
gloves, sterile drape, hand hygiene and skin antiseptic. A timeout
was performed prior to the initiation of the procedure.

The patient was laid prone on the fluoroscopic table.

The skin overlying the craniovertebral junction was then prepped and
draped in the usual sterile fashion.

Under biplane intermittent fluoroscopy, via the left sided approach,
a 22 gauge spinal needle was advanced under intermittent fluoroscopy
into the posterior arachnoid space at the level of C1-C2. Free
egress of clear CSF was obtained.

Approximately 8 cc of a clear CSF of was then obtained in separate
vials and sent for microbiologic analysis.

The needle was then removed. Hemostasis was achieved at the skin
entry site.

Initial attempts were made from the right side again using a 22
gauge spinal needle but were unsuccessful in gaining entry into the
posterior subarachnoid space.
IMPRESSION: Status post fluoroscopic guided needle placement in the subarachnoid
space at C1-C2 posteriorly as described above with the retrieval of
approximately 8 cc of clear CSF.

Hadil Jim and Hadil Locklear.

## 2018-12-04 IMAGING — CT CT HEAD W/O CM
4 series · 16 of 47 positions shown, 18 images · non-contrast
Comparison: Brain MRI 09/15/2016

CLINICAL DATA: Gaze palsy.  Seizure like activity.

EXAM:
CT HEAD WITHOUT CONTRAST
TECHNIQUE: Contiguous axial images were obtained from the base of the skull
through the vertex without intravenous contrast.

[Series 2: head without · axial · non-contrast · 0.44mm/px · z∈[-585,-475]mm · 7 of 30 slices shown, 9 images]
[im 4/30  brain]
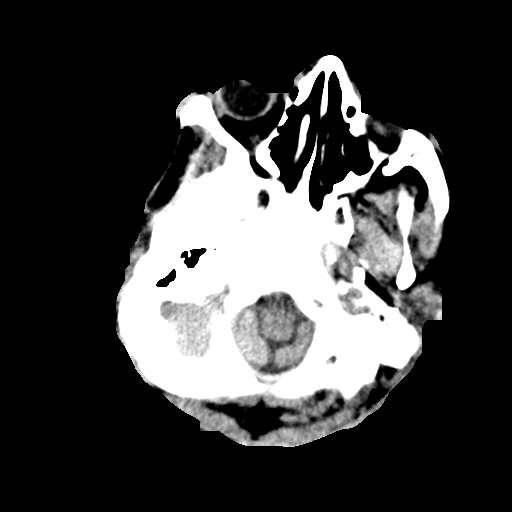
[im 4/30  bone]
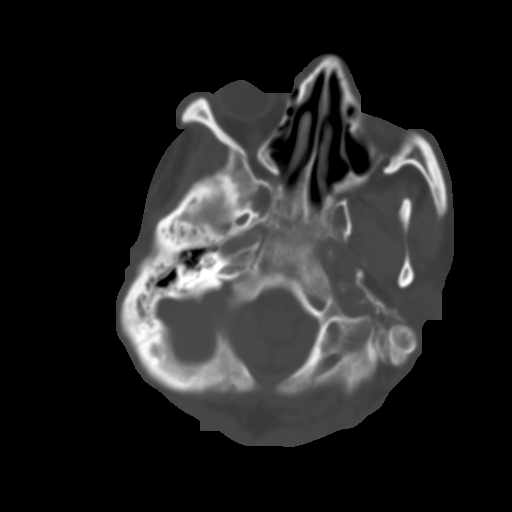
[im 8/30  brain]
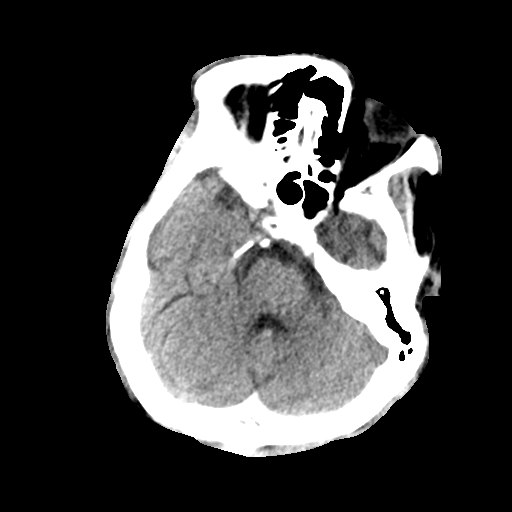
[im 11/30  brain]
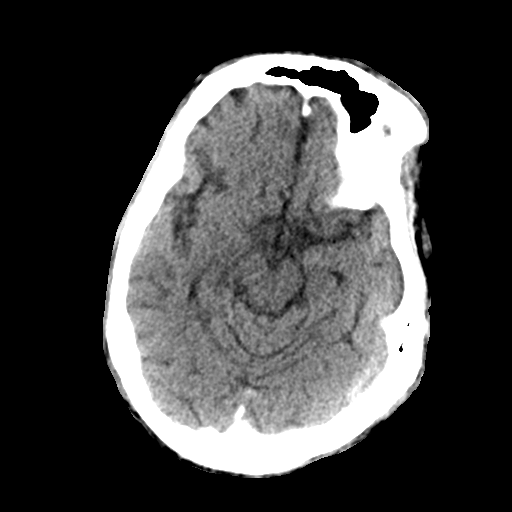
[im 15/30  brain]
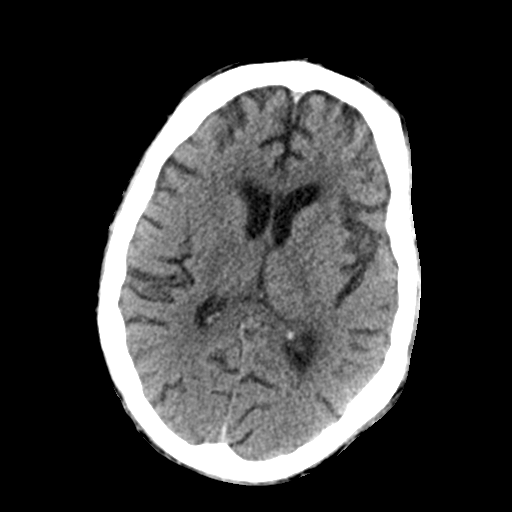
[im 19/30  brain]
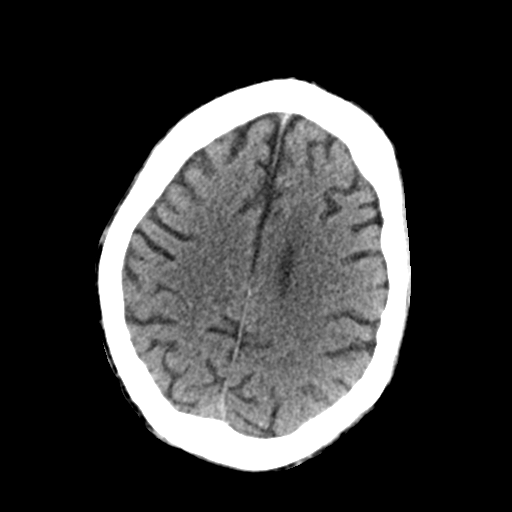
[im 19/30  bone]
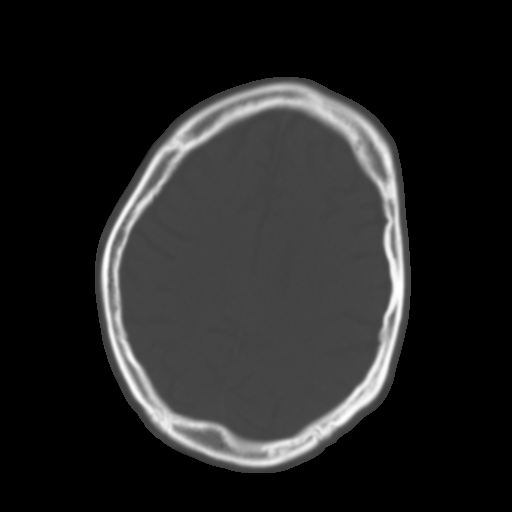
[im 22/30  brain]
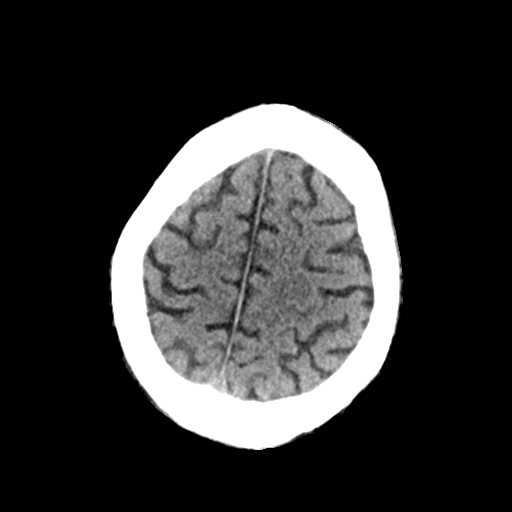
[im 26/30  brain]
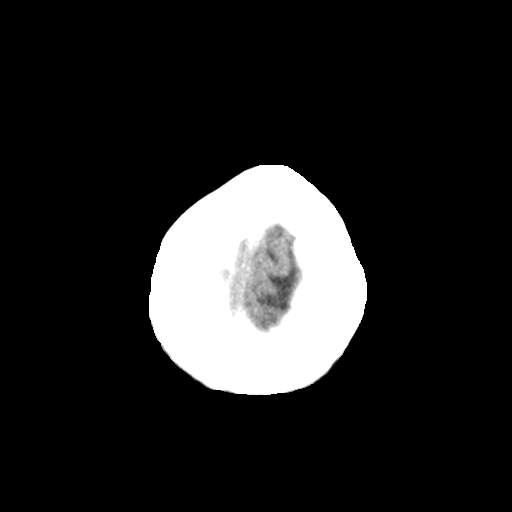

[Series 3: head bone · axial · 0.44mm/px · z∈[-586,-558]mm · 3 of 74 slices shown]
[im 8/74  bone]
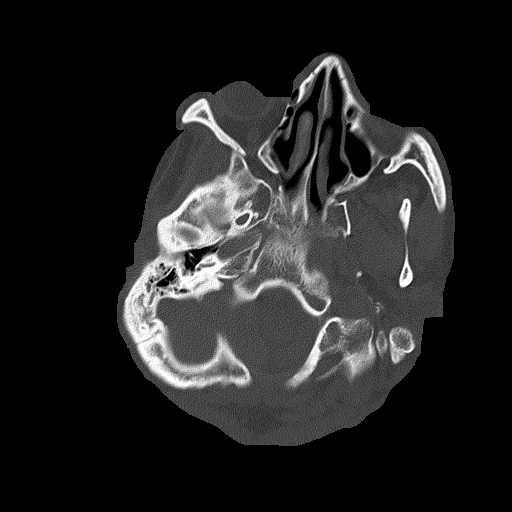
[im 15/74  bone]
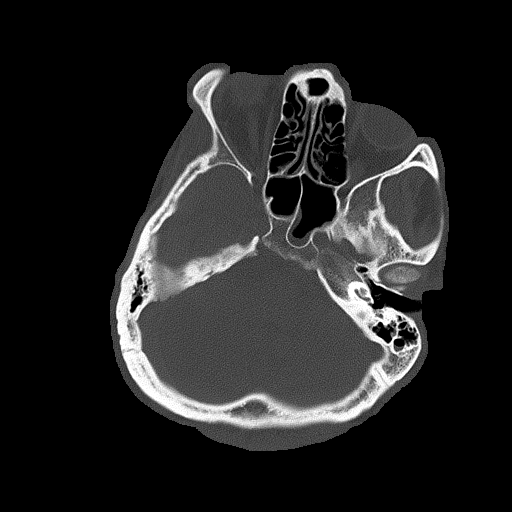
[im 22/74  bone]
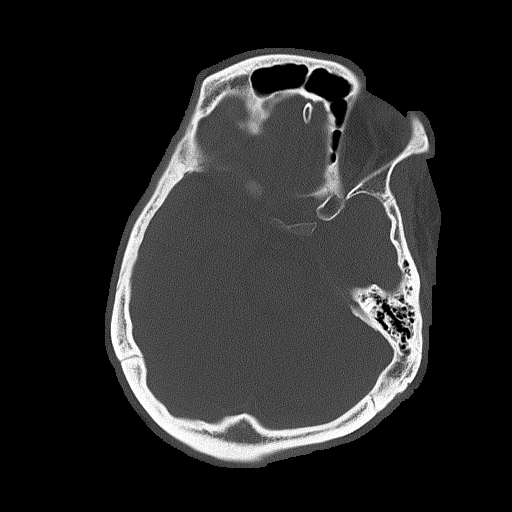

[Series 4: head without cor · coronal · non-contrast · 0.31mm/px · 3 of 64 slices shown]
[im 22/64  brain]
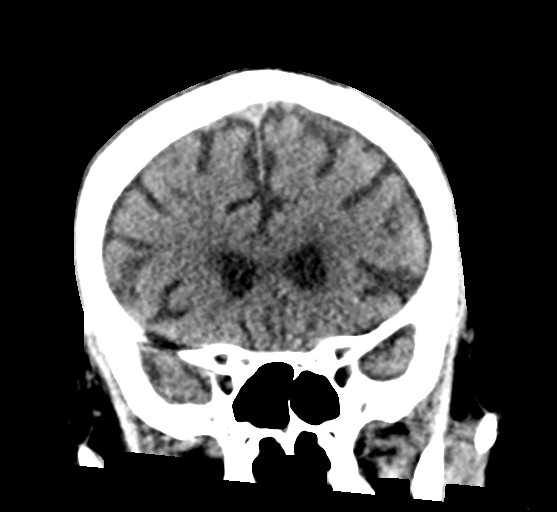
[im 29/64  brain]
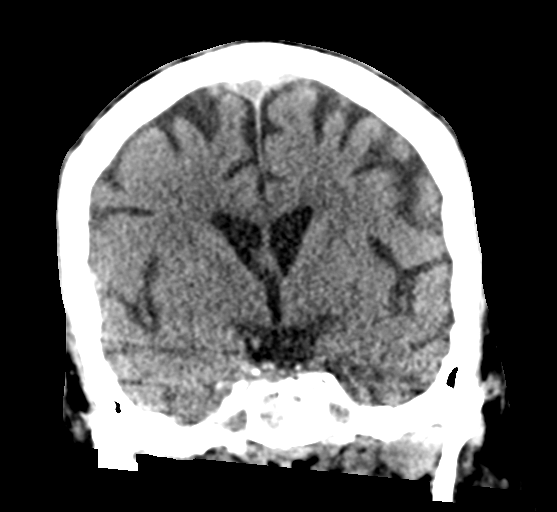
[im 36/64  brain]
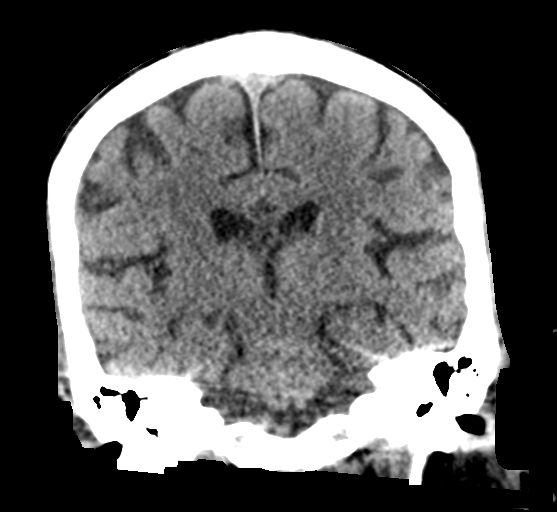

[Series 5: head without sag · sagittal · non-contrast · 0.30mm/px · 3 of 48 slices shown]
[im 16/48  brain]
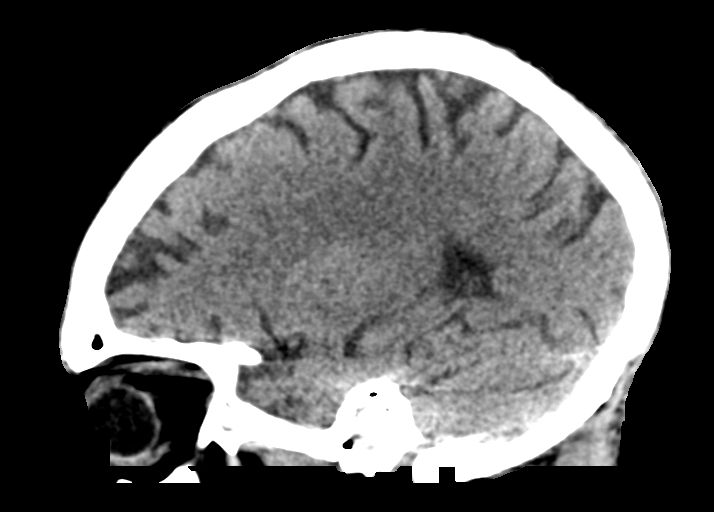
[im 24/48  brain]
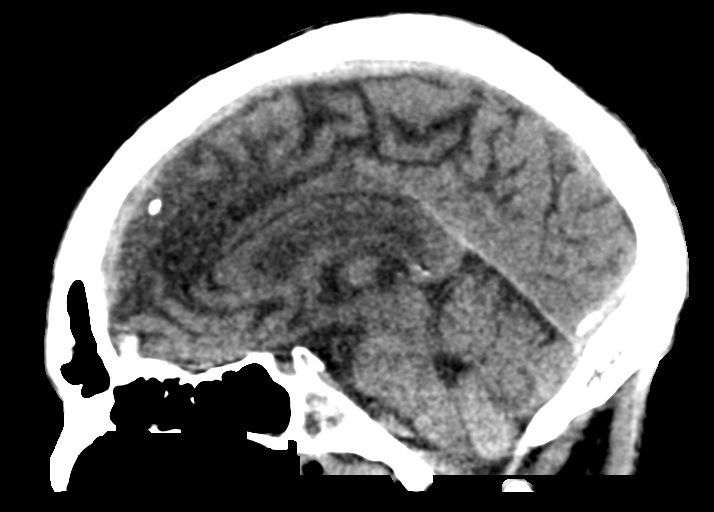
[im 32/48  brain]
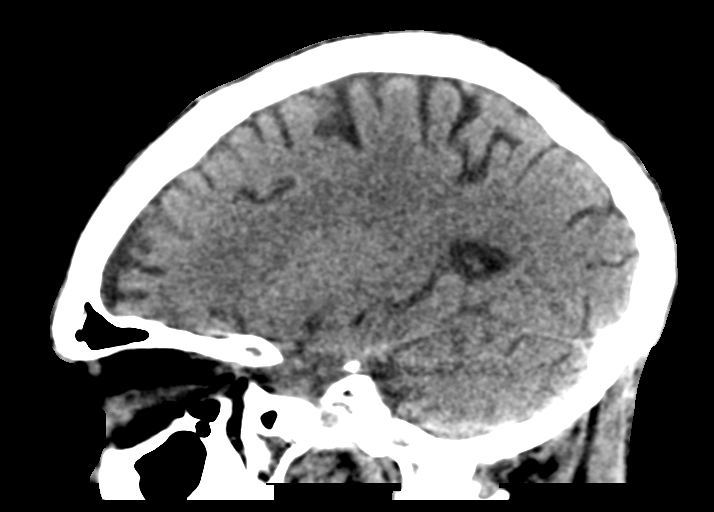

[16 of 47 positions shown; findings below may reference images not displayed]

FINDINGS: Brain: Small chronic right cerebellar infarcts on MRI are not well
seen by CT. The punctate acute pontine infarct on MRI is also not
apparent on CT. There is no evidence of acute cortical infarct,
intracranial hemorrhage, mass, midline shift, or extra-axial fluid
collection. The ventricles and sulci are normal.

Vascular: No hyperdense vessel or unexpected calcification.

Skull: No fracture or osseous lesion.

Sinuses/Orbits: Mild paranasal sinus mucosal thickening and small
mastoid effusions. Unremarkable orbits.

Other: None.
IMPRESSION: No evidence of acute intracranial abnormality. Punctate pontine
infarct on MRI is not apparent on CT.

## 2018-12-04 IMAGING — DX DG CHEST 1V PORT
1 series · 1 of 1 positions shown · non-contrast
Comparison: 09/22/2016, 09/19/2016, 08/28/2016.

CLINICAL DATA: Respiratory failure.  Hypoxia.

EXAM:
PORTABLE CHEST 1 VIEW

[chest ap]
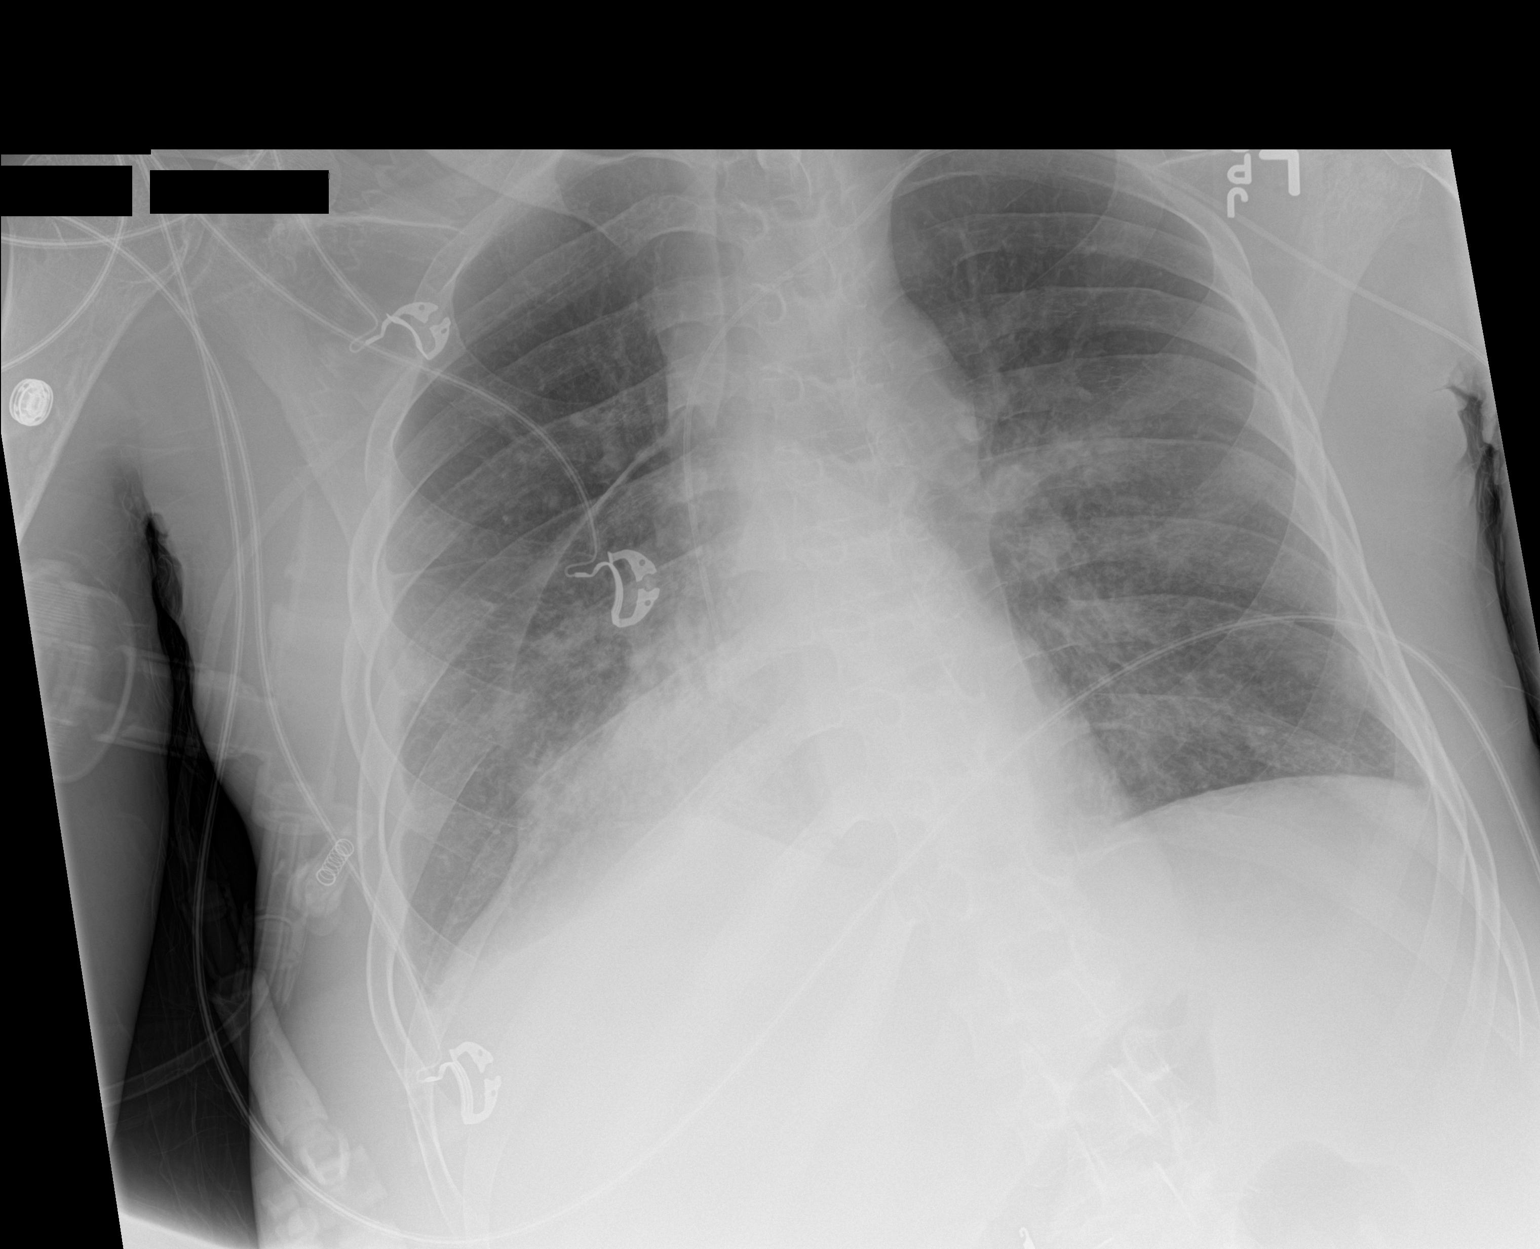

[1 of 1 positions shown; findings below may reference images not displayed]

FINDINGS: Tracheostomy tube noted in good anatomic position. Left PICC line
noted with tip projected over superior vena cava. Heart size stable.
Persist atelectasis right lower lobe. Partial clearing of bilateral
pulmonary infiltrates. Small right pleural effusion.
IMPRESSION: 1. Lines and tubes in stable position.

2.  Persistent right lower lobe atelectasis.

3. Partial clearing of bilateral pulmonary infiltrates. Small right
pleural effusion again noted.

## 2018-12-05 IMAGING — CR DG CHEST 1V PORT
1 series · 1 of 1 positions shown · non-contrast
Comparison: 09/25/2016

CLINICAL DATA: Followup right-sided infiltrate

EXAM:
PORTABLE CHEST 1 VIEW

[AP]
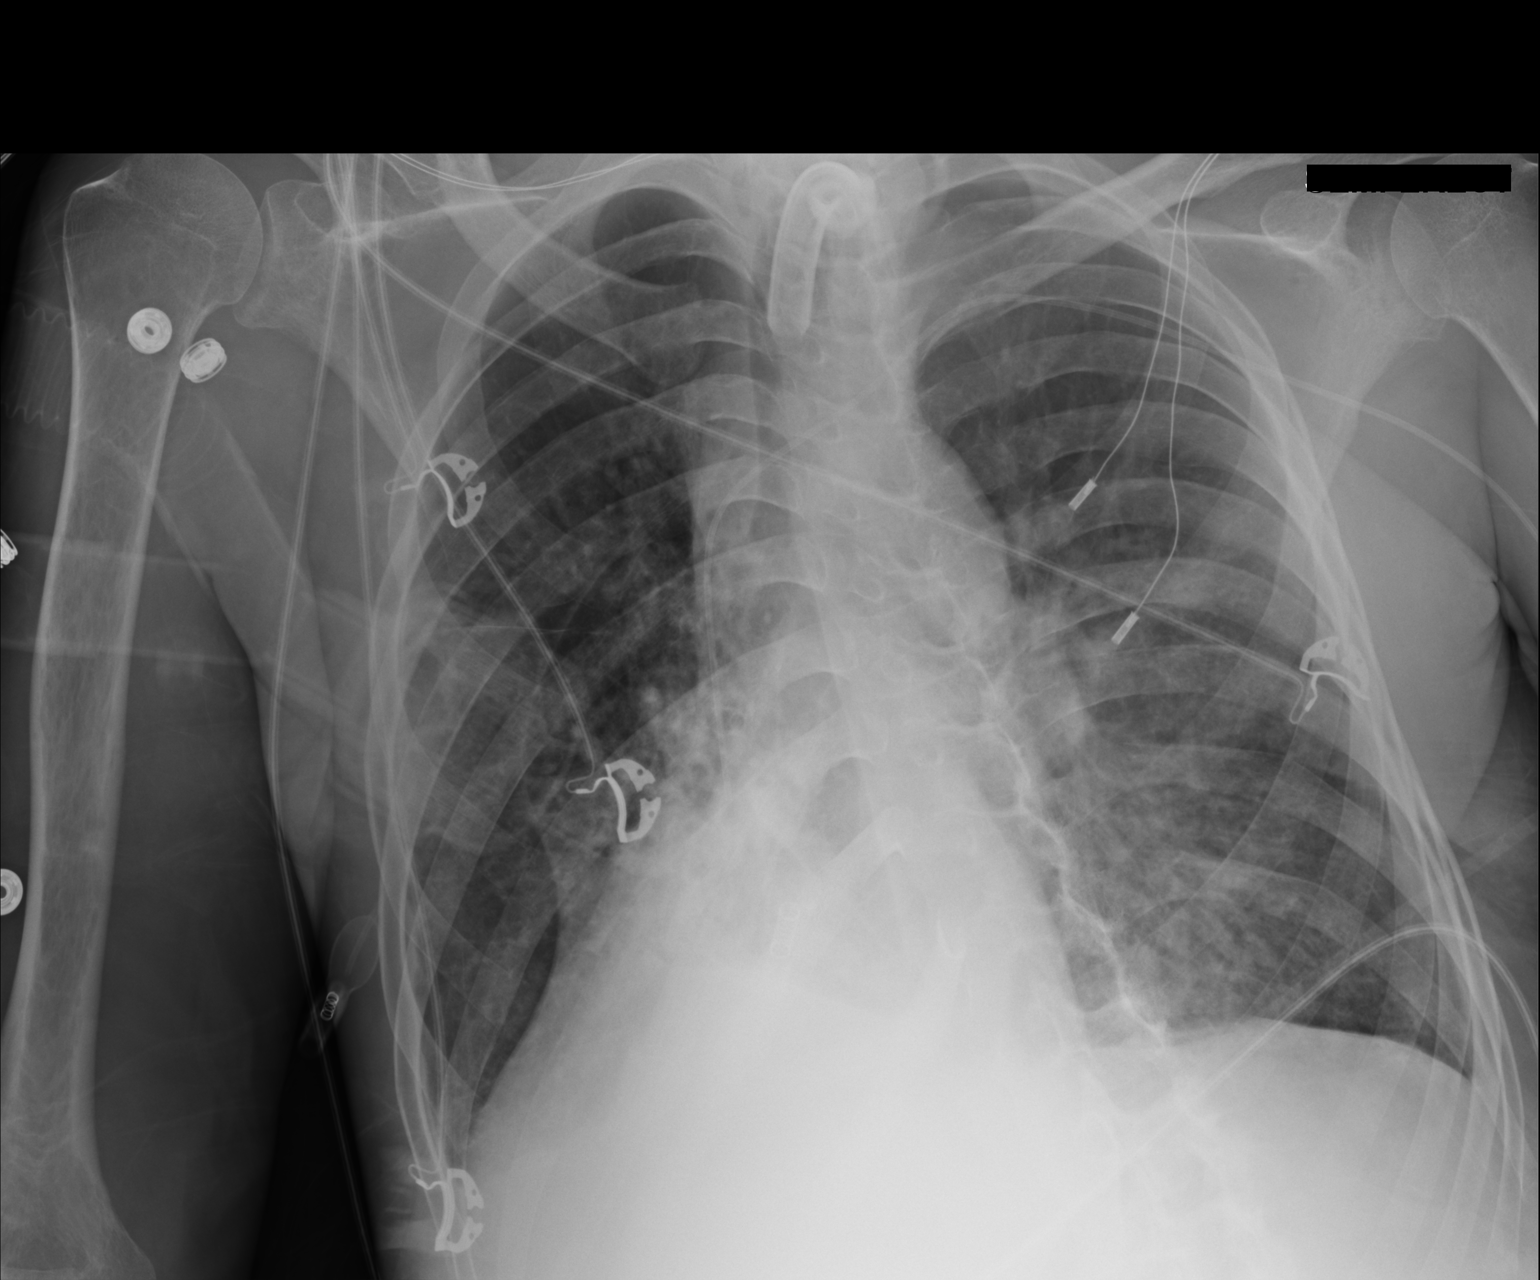

[1 of 1 positions shown; findings below may reference images not displayed]

FINDINGS: Cardiac shadow is stable but somewhat obscured by a basilar
infiltrates. Left-sided PICC line and tracheostomy tube are again
noted and stable. There is some persistent increased density in the
medial aspect of the left lung base. Additionally consolidation in
the right base is noted stable from the prior exam. No pneumothorax
is seen. No bony abnormality is noted.
IMPRESSION: Stable consolidation in the right base with increasing infiltrate in
the medial left base.

## 2018-12-06 IMAGING — MR MR HEAD WO/W CM
8 of 14 series · 27 of 48 positions shown · IV contrast (multihance)
Comparison: Prior CT from 09/25/2016 and previous MRI from
09/15/2016.

CLINICAL DATA: Initial evaluation for acute seizure.

EXAM:
MRI HEAD WITHOUT AND WITH CONTRAST
TECHNIQUE: Multiplanar, multiecho pulse sequences of the brain and surrounding
structures were obtained without and with intravenous contrast.
CONTRAST:  9 cc of MultiHance

[Series 5: DWI · axial · 3.0mm · 1.09mm/px · z∈[-39,+89]mm · 5 of 88 slices shown (1 of 4)]
[im 1/88]
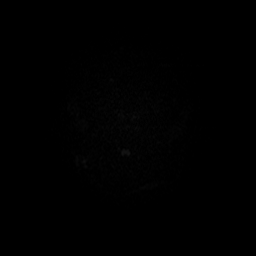
[im 22/88]
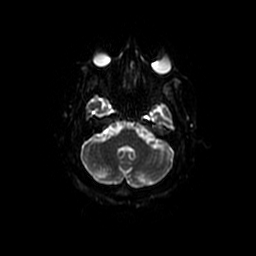
[im 44/88]
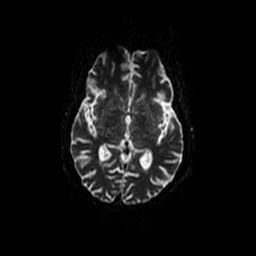
[im 66/88]
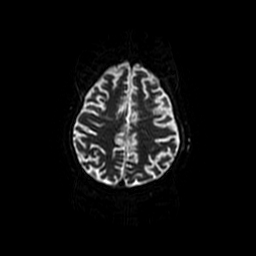
[im 88/88]
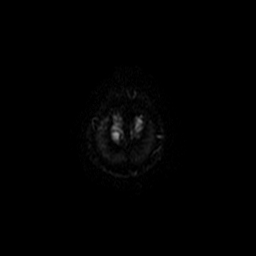

[Series 6: T2 · axial · 5.0mm · 0.43mm/px · 1 of 22 slices shown]
[im 1/22]
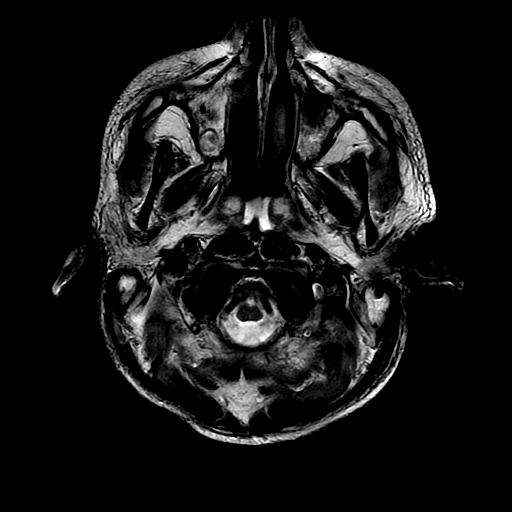

[Series 7: FLAIR · axial · 5.0mm · 0.43mm/px · 1 of 22 slices shown (1 of 2)]
[im 1/22]
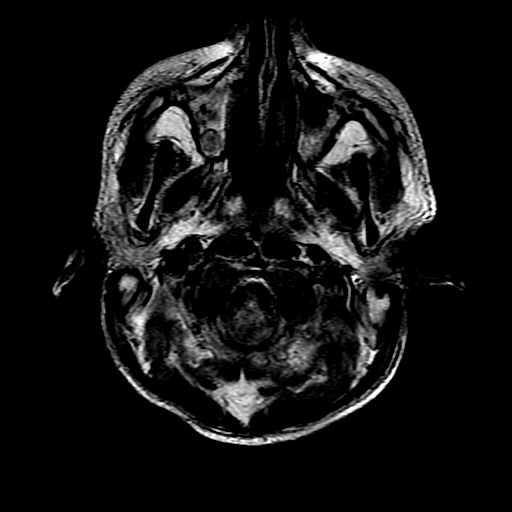

[Series 8: DWI · coronal · 5.0mm · 1.09mm/px · 4 of 72 slices shown (2 of 4)]
[im 1/72]
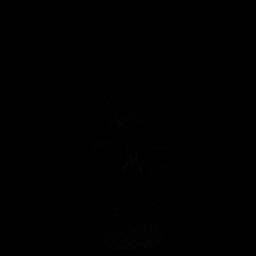
[im 24/72]
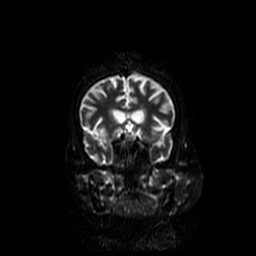
[im 48/72]
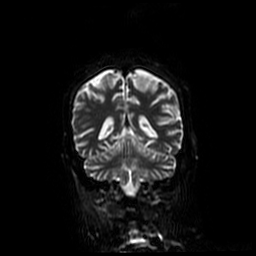
[im 72/72]
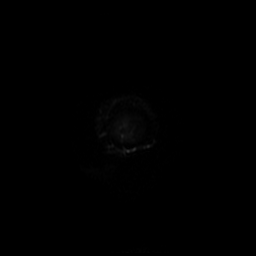

[Series 10: FLAIR · sagittal · 1.2mm · 0.49mm/px · 10 of 320 slices shown (2 of 2)]
[im 19/320]
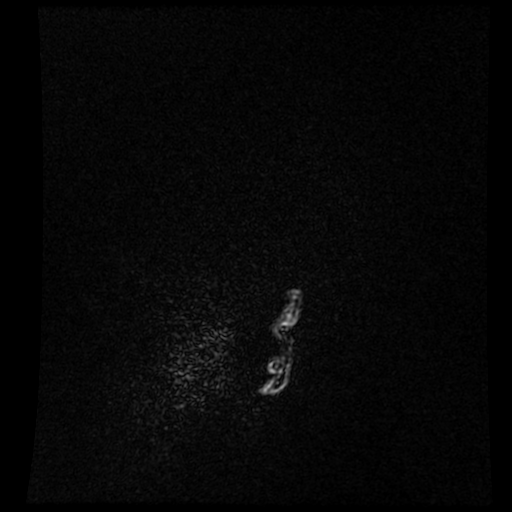
[im 57/320]
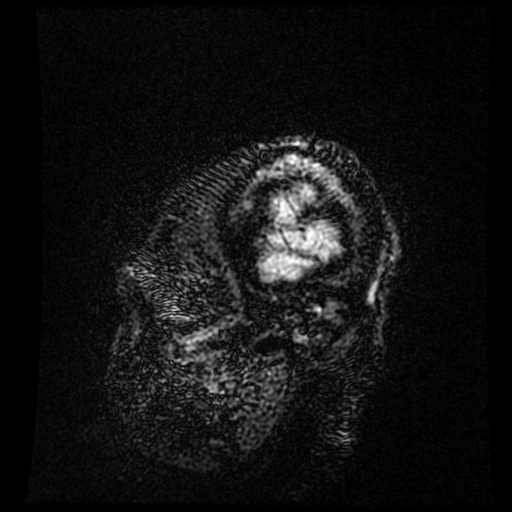
[im 94/320]
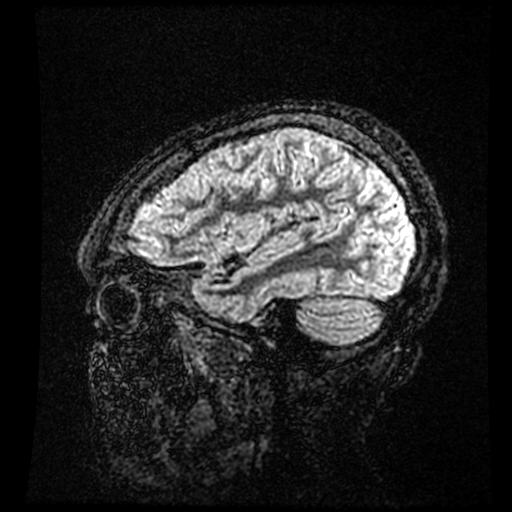
[im 132/320]
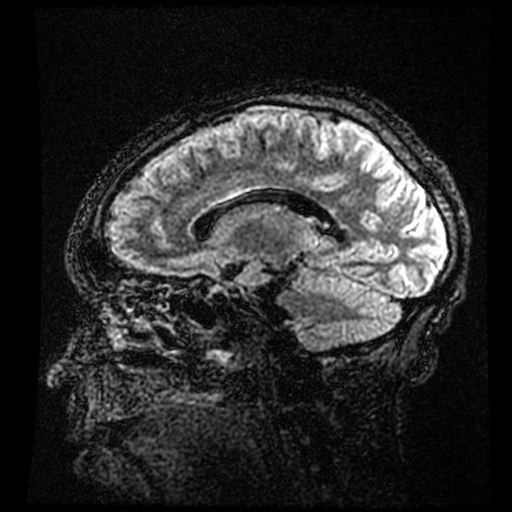
[im 169/320]
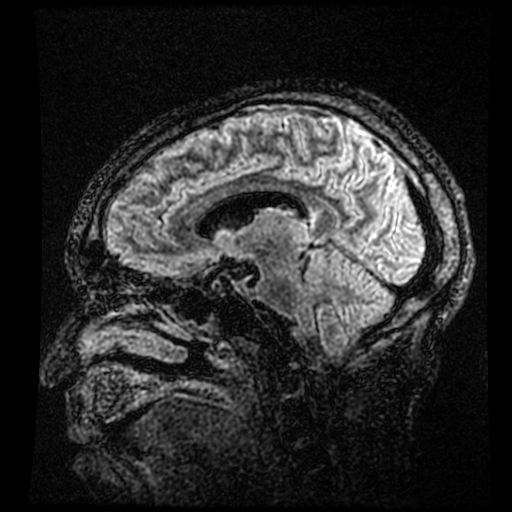
[im 188/320]
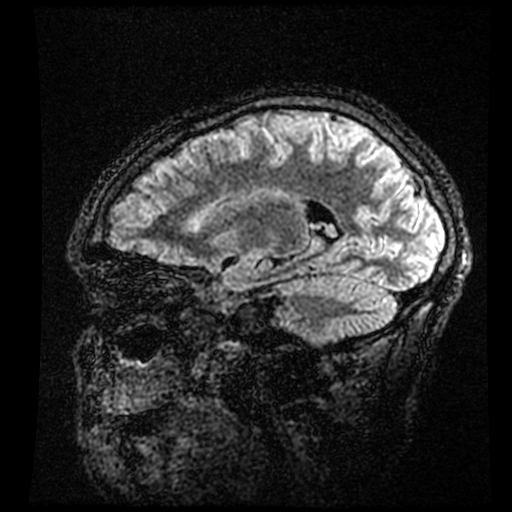
[im 226/320]
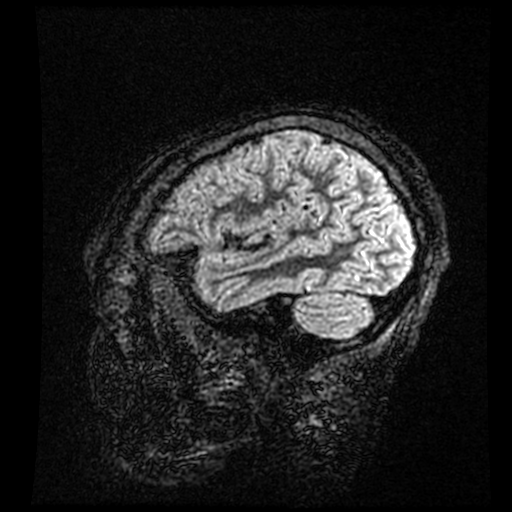
[im 263/320]
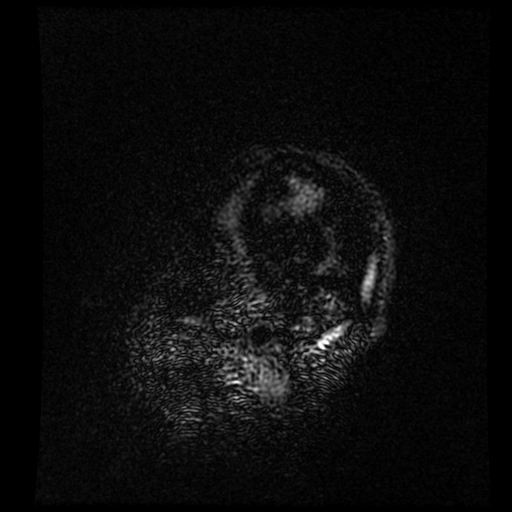
[im 282/320]
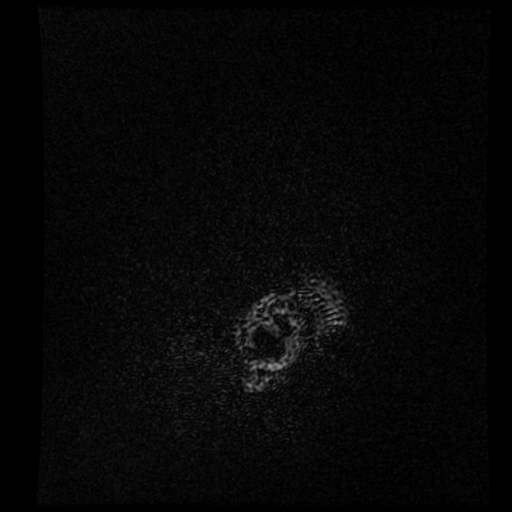
[im 301/320]
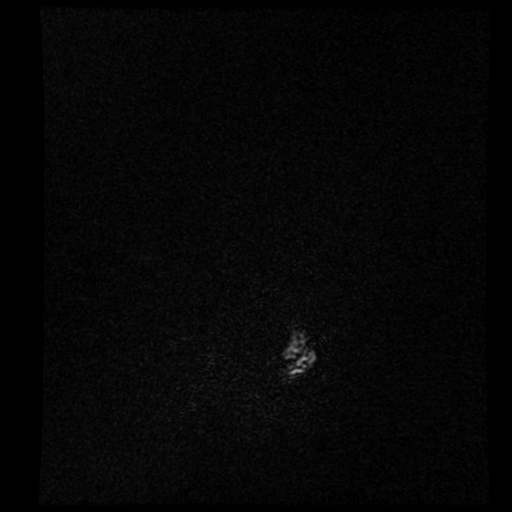

[Series 15: T1 post-contrast · coronal · 5.0mm · 0.39mm/px · 2 of 28 slices shown]
[im 1/28]
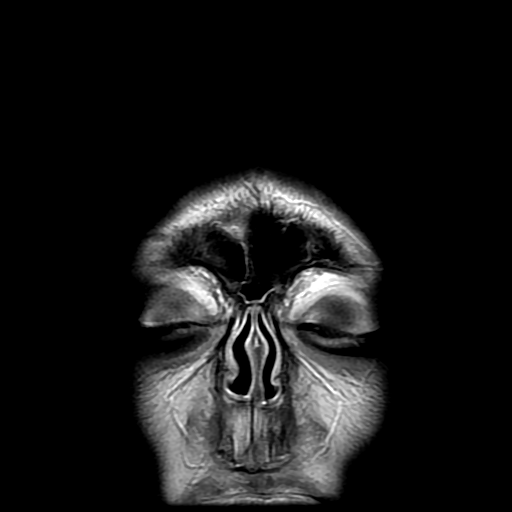
[im 28/28]
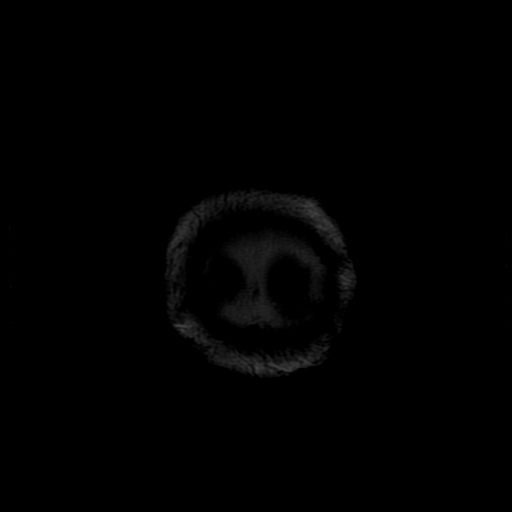

[Series 500: DWI · axial · 3.0mm · 1.09mm/px · z∈[-39,+89]mm · 2 of 44 slices shown (3 of 4)]
[im 1/44]
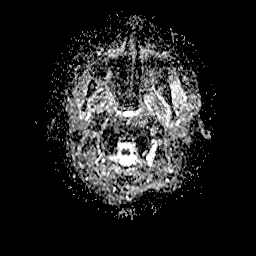
[im 44/44]
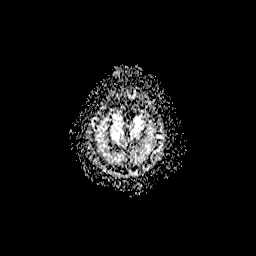

[Series 800: DWI · coronal · 5.0mm · 1.09mm/px · 2 of 36 slices shown (4 of 4)]
[im 1/36]
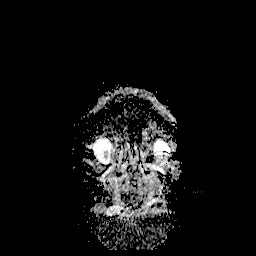
[im 36/36]
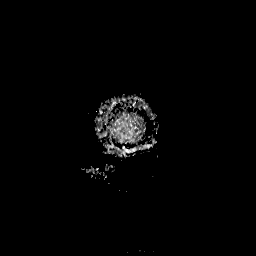

[27 of 48 positions shown; findings below may reference images not displayed]

FINDINGS: Brain: Cerebral volume is stable. Minimal T2/FLAIR hyperintensity
within the periventricular white matter noted, likely related to
chronic small vessel ischemic disease. This is stable from prior.

Small punctate focus of diffusion abnormality again noted within the
left paramedian pons, slightly decreased in intensity as compared to
previous study. No other diffusion abnormality to suggest acute or
subacute ischemia. Gray-white matter differentiation otherwise
maintained. Small chronic right cerebellar infarcts again noted. No
other evidence for chronic infarction. No susceptibility artifact to
suggest acute or chronic intracranial hemorrhage.

No mass lesion, midline shift, or mass effect. Ventricles are stable
in size without hydrocephalus. No extra-axial fluid collection.
Major dural sinuses are grossly patent. No abnormal enhancement. No
intrinsic temporal lobe abnormality.

Pituitary gland within normal limits.  Midline structures intact.

Vascular: Normal intravascular flow voids are maintained.

Skull and upper cervical spine: Cerebellar tonsils minimally low
lying within the foramen magnum by 2-3 mm without Chiari
malformation. Craniocervical junction otherwise unremarkable. Bone
marrow signal intensity somewhat diffusely decreased on T1 weighted
signal intensity, most commonly related to anemia, smoking, or
obesity. No scalp soft tissue abnormality.

Sinuses/Orbits: Globes and orbital soft tissues within normal
limits.

Scattered mucosal thickening throughout the paranasal sinuses. Fluid
within the nasopharynx. Scattered bilateral mastoid effusions.
Patient likely intubated.
IMPRESSION: 1. Persistent punctate diffusion abnormality at previously
identified small left paramedian pontine infarct.
2. No other new acute intracranial process.
3. Small chronic right cerebellar infarcts, stable.

## 2018-12-08 IMAGING — CR DG CHEST 1V PORT
1 series · 1 of 1 positions shown · non-contrast
Comparison: 09/26/2016

CLINICAL DATA: Respiratory failure.

EXAM:
PORTABLE CHEST 1 VIEW

[AP]
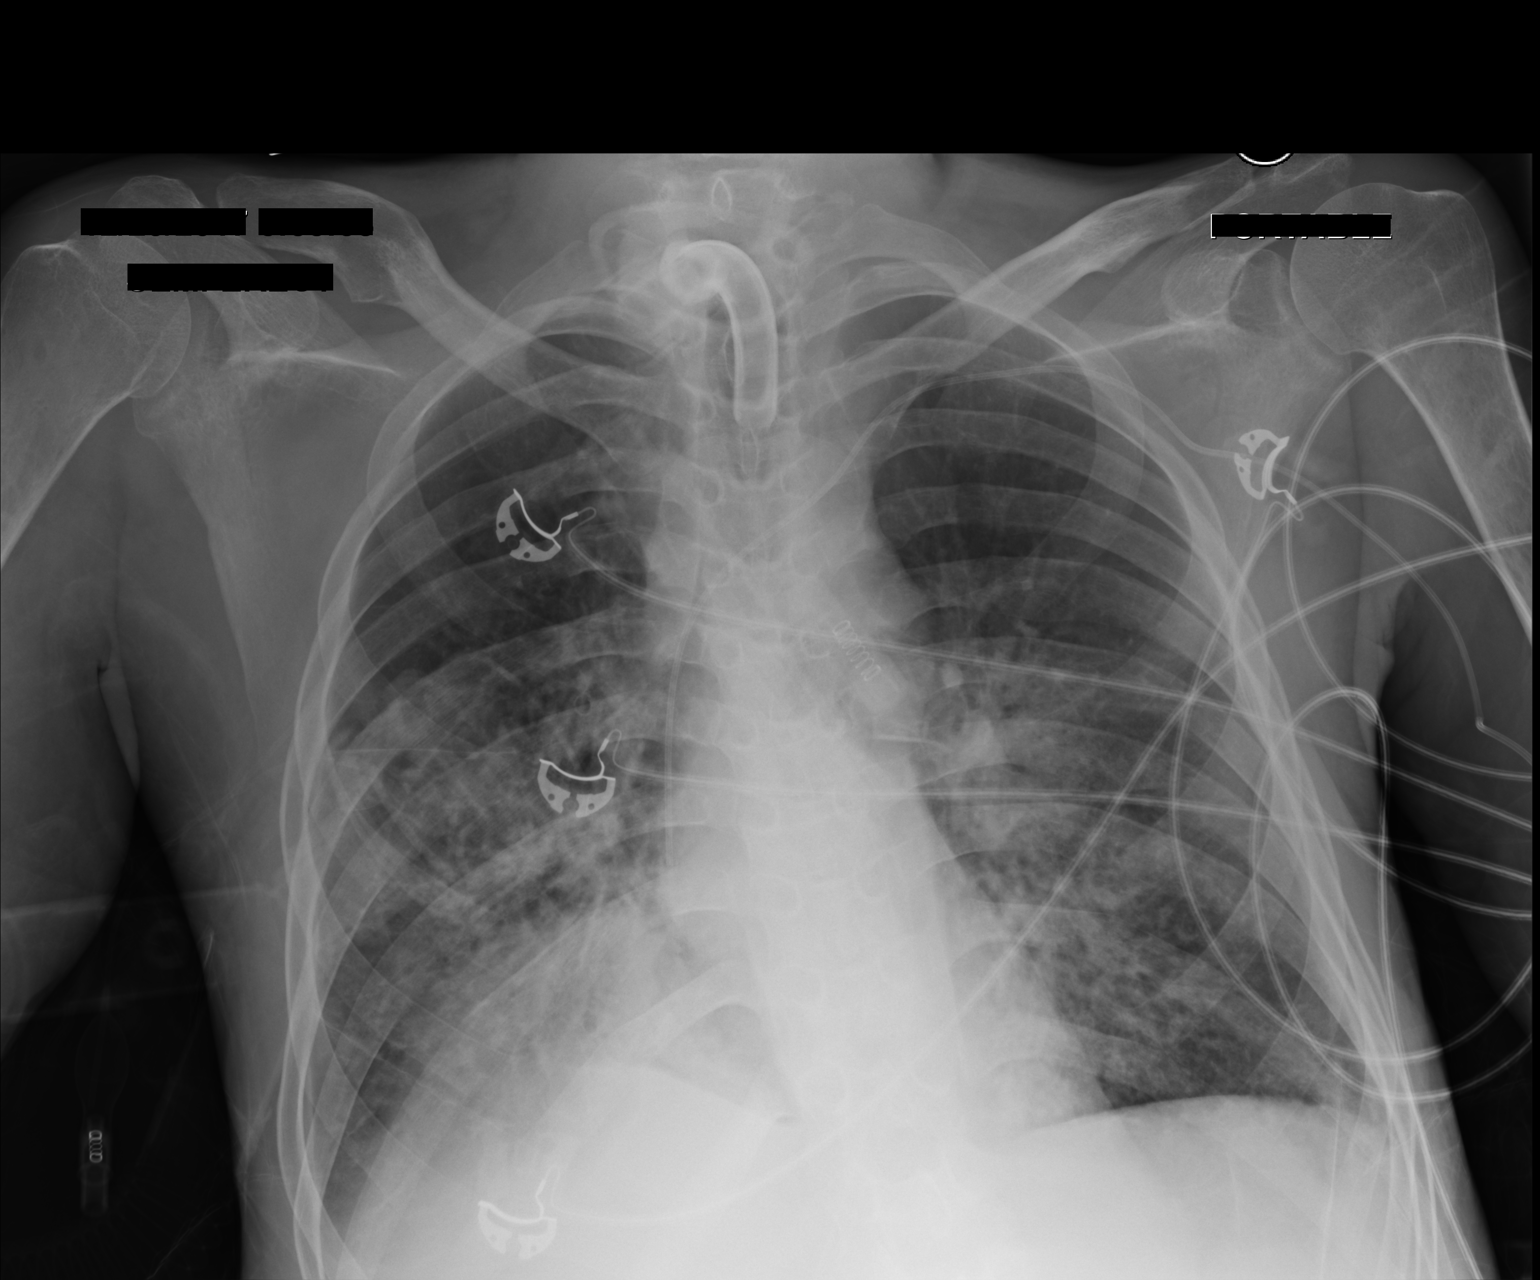

[1 of 1 positions shown; findings below may reference images not displayed]

FINDINGS: Airspace disease or consolidation along the medial right lung base
has slightly improved. However, there are persistent bilateral
airspace densities, particularly in the perihilar regions. The
disease in the perihilar regions is more confluent than it was on
the previous examination. Cardiac silhouette is grossly stable.
There is a tracheostomy tube. PICC line tip in the lower SVC region.
IMPRESSION: Persistent bilateral airspace disease.

Improved aeration at the medial right lung base with increased
disease in the perihilar regions.

## 2018-12-12 IMAGING — CR DG CHEST 1V PORT
1 series · 1 of 1 positions shown · non-contrast
Comparison: 09/29/2016

CLINICAL DATA: 30-year-old Quadriplegic male. Chronic decubitus
ulcers with pelvic osteomyelitis. Altered mental status with
seizures. Acute respiratory failure. Initial encounter.

EXAM:
PORTABLE CHEST 1 VIEW

[AP]
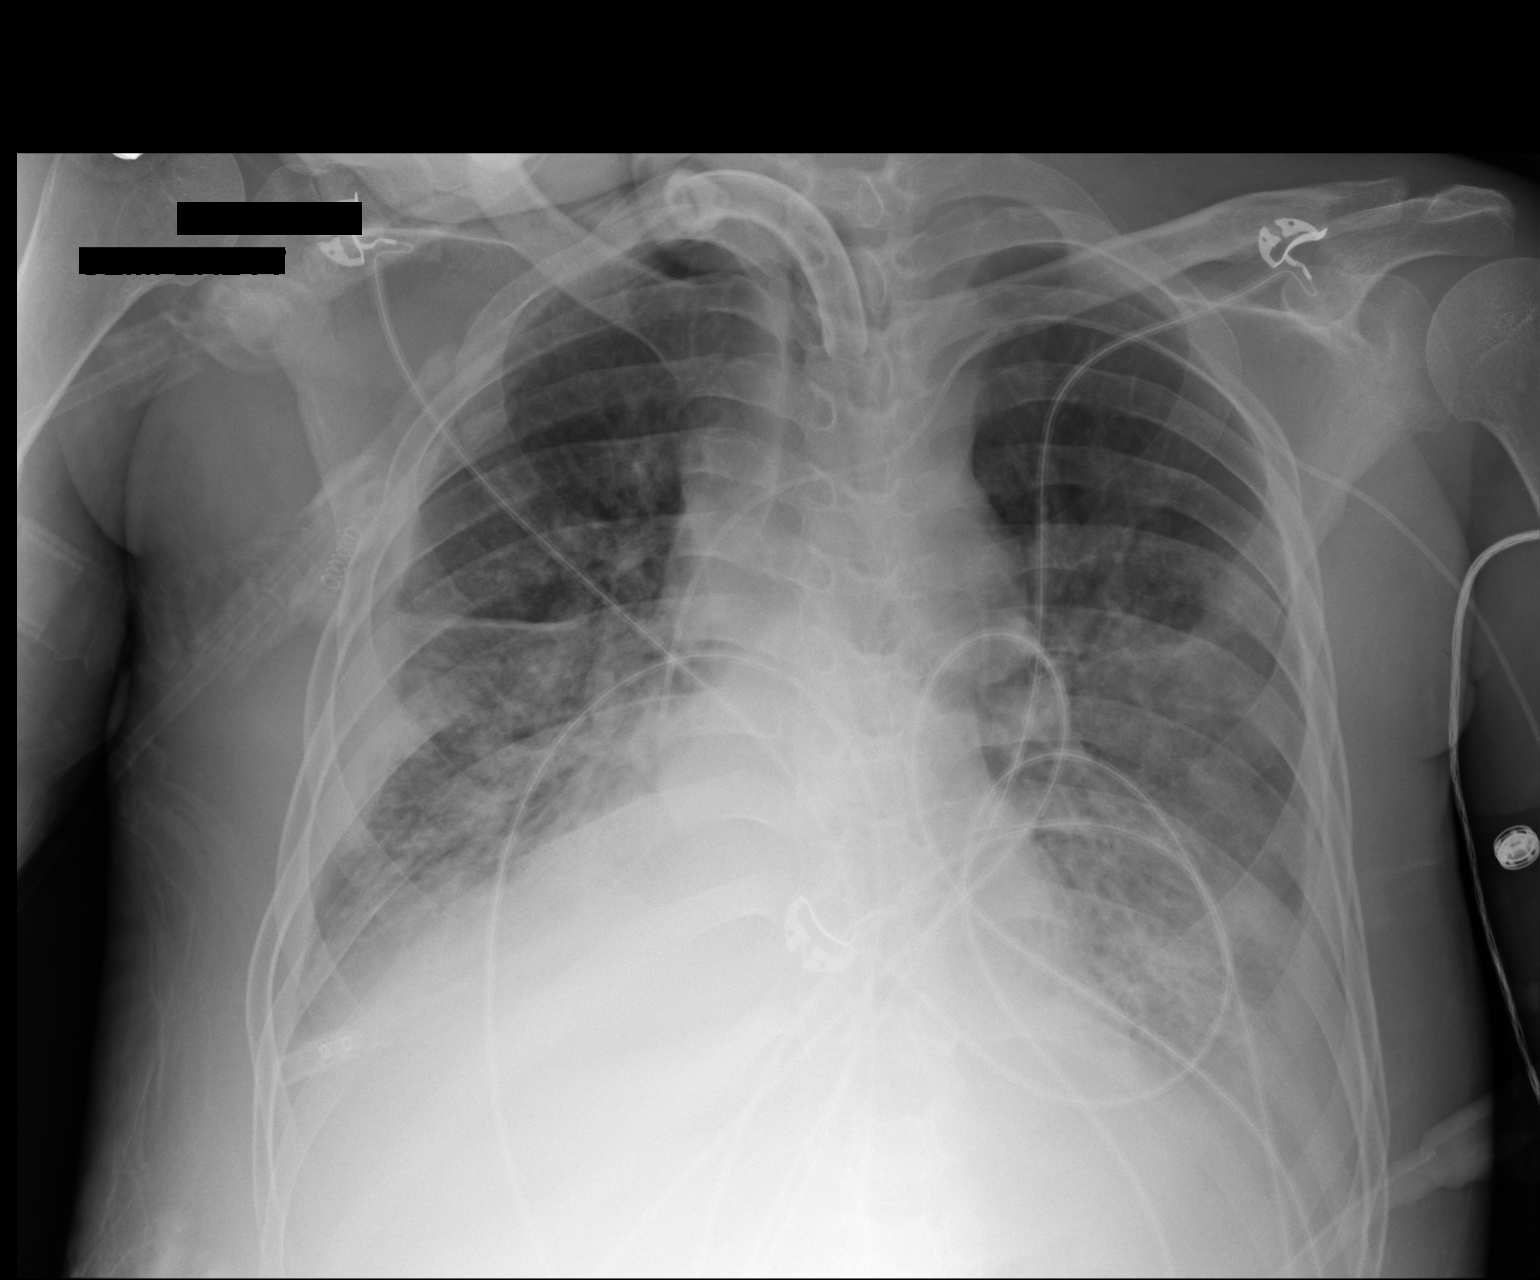

[1 of 1 positions shown; findings below may reference images not displayed]

FINDINGS: Portable AP semi upright view at 2623 hours. Stable tracheostomy
tube. Stable left upper extremity PICC line. Stable mediastinal
contours. Right lower lobe consolidation and/or collapse continues.
Veiling bilateral pleural effusions are more apparent, including
thickening in the right minor fissure which is probably pleural
fluid related. Associated decreased left lung base ventilation. No
pneumothorax. No acute pulmonary edema suspected.
IMPRESSION: 1.  Stable lines and tubes.
2. Evidence of increased bilateral pleural effusions since
09/29/2016. Overall pleural fluid volume suspected to be small,
greater on the right.
3. Otherwise stable right lower lobe consolidation.

## 2018-12-12 IMAGING — CR DG ABDOMEN 1V
1 series · 1 of 1 positions shown · non-contrast
Comparison: [DATE].

CLINICAL DATA: Possibly ileus.

EXAM:
ABDOMEN - 1 VIEW

[AP]
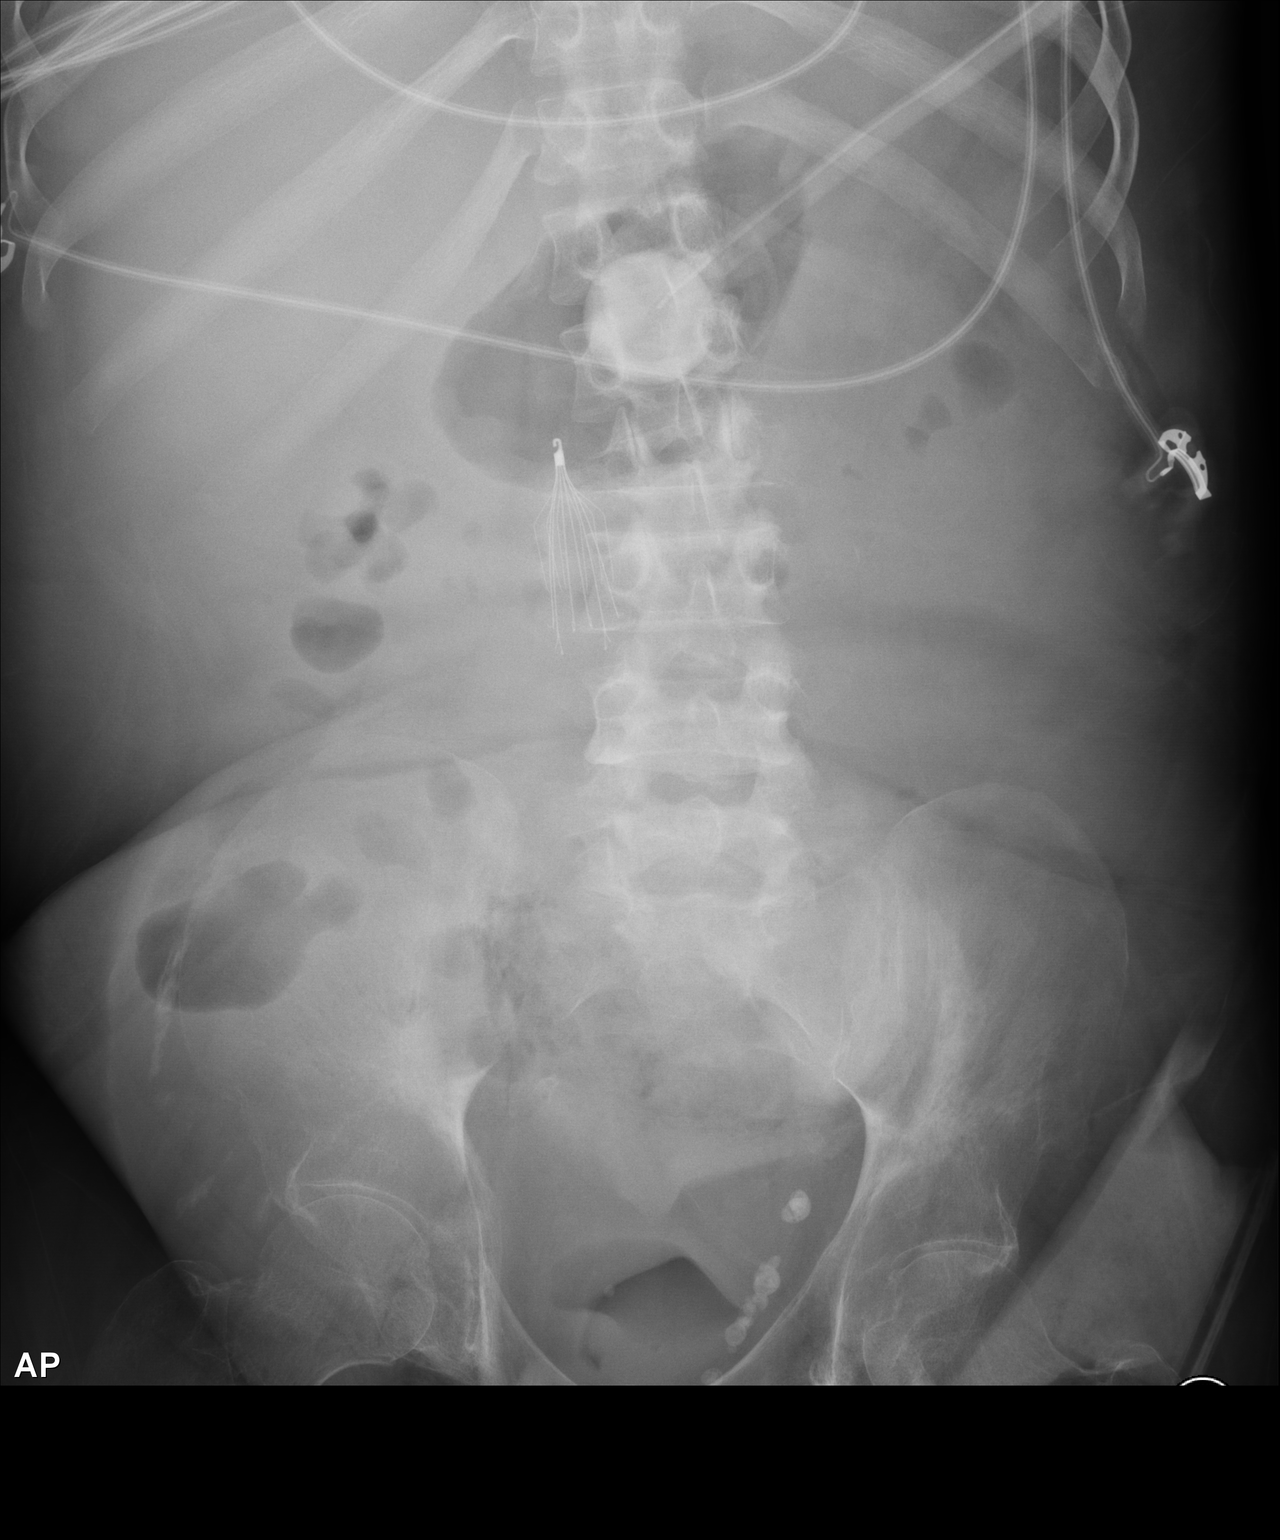

[1 of 1 positions shown; findings below may reference images not displayed]

FINDINGS: Soft tissue structures are unremarkable. No bowel distention. NG
tube noted good anatomic position. IVC filter noted with its tip at
the L2. L2 compression fracture again noted. Chronic destructive
changes of the left ischium noted. Reference is made to recent CT
report for discussion of osteomyelitis present.
IMPRESSION: 1.  Gastrostomy tube in good anatomic position.

2. IVC filter noted in stable position.

3. L2 compression fracture with possible destructive changes again
noted. Reference is made to CT report for further discussion of
other bony findings present.

## 2018-12-14 IMAGING — CR DG CHEST 1V PORT
1 series · 1 of 1 positions shown · non-contrast
Comparison: Chest radiograph 10/03/2016

CLINICAL DATA: Respiratory failure are

EXAM:
PORTABLE CHEST 1 VIEW

[AP]
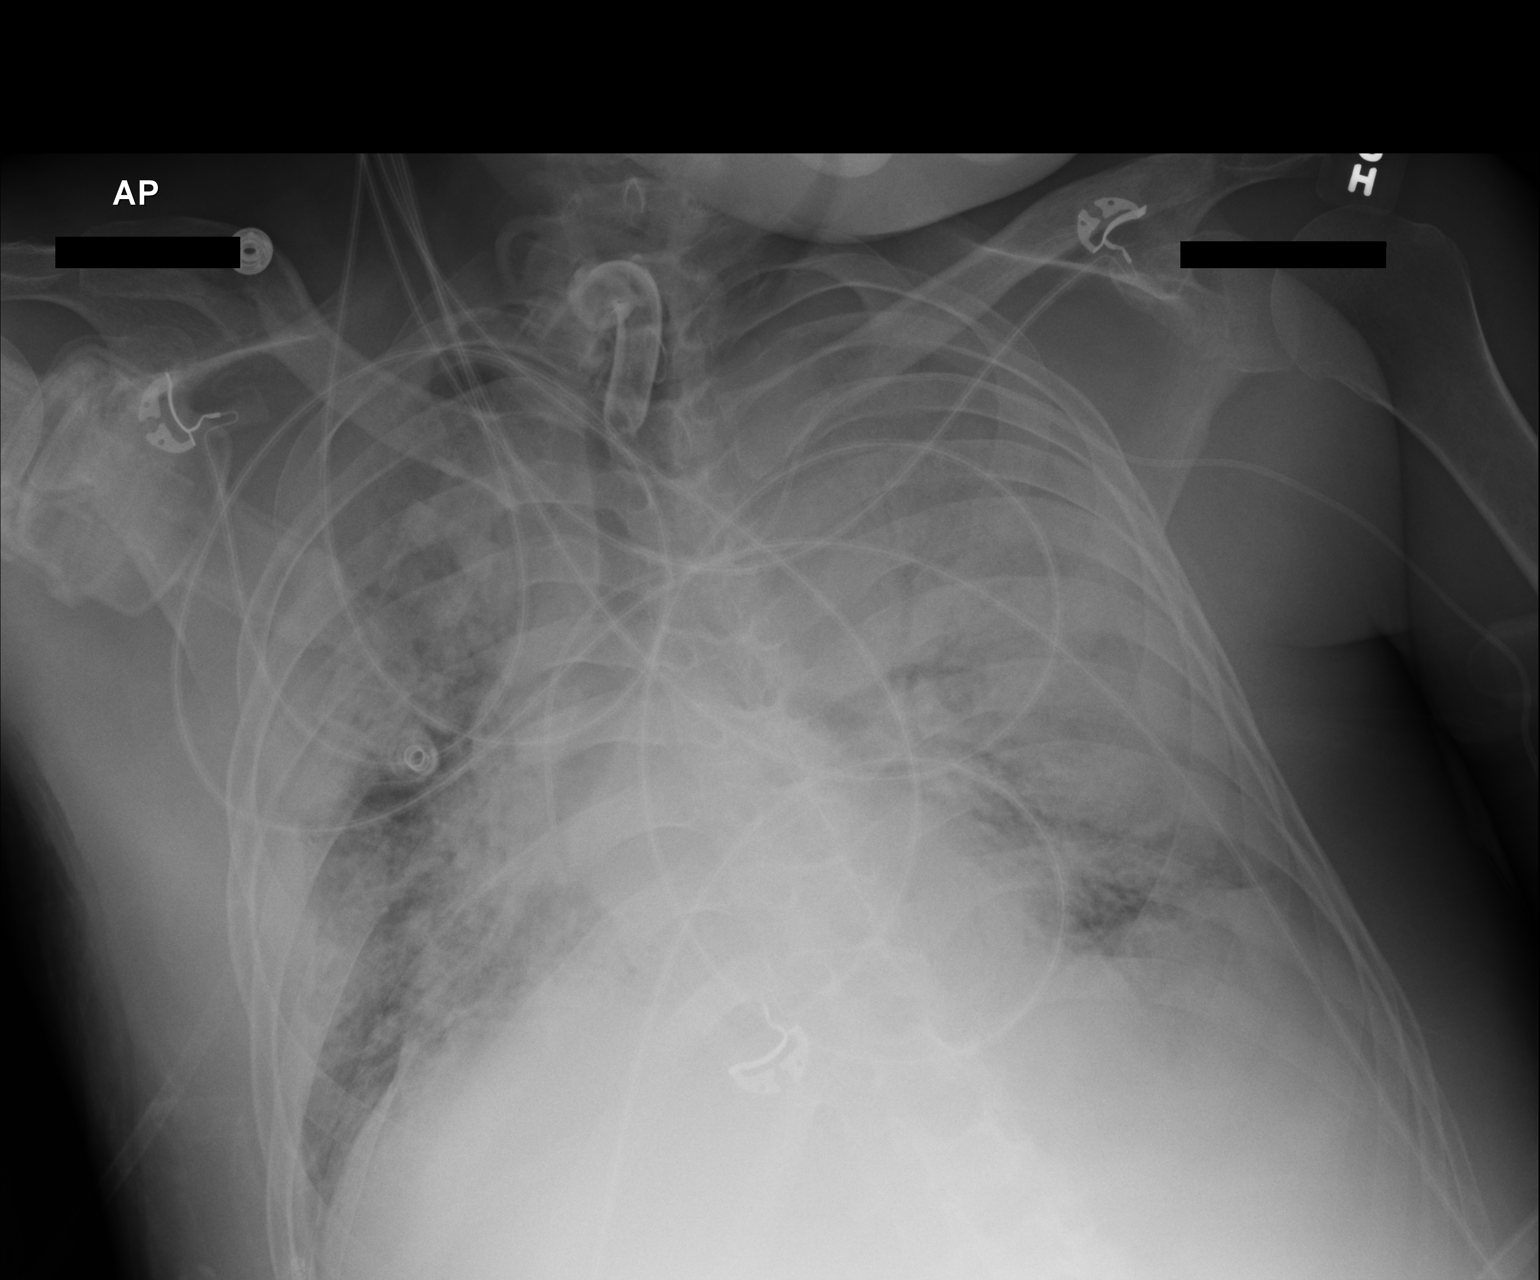

[1 of 1 positions shown; findings below may reference images not displayed]

FINDINGS: Tracheostomy tube tip is at the level of the clavicular heads.
Cardiomediastinal contours are largely obscured. There diffuse
bilateral airspace opacities, greatly worsened from the prior
examination.
IMPRESSION: Greatly worsened bilateral airspace opacities, left greater than
right. The appearance is suggestive of ARDS.
